# Patient Record
Sex: Female | Born: 1940 | Race: Black or African American | Hispanic: No | Marital: Married | Smoking: Never smoker
Health system: Southern US, Community
[De-identification: ages and names within clinical notes are randomized; demographics above are authoritative.]

## PROBLEM LIST (undated history)

## (undated) DIAGNOSIS — I10 Essential (primary) hypertension: Secondary | ICD-10-CM

## (undated) DIAGNOSIS — I639 Cerebral infarction, unspecified: Secondary | ICD-10-CM

## (undated) DIAGNOSIS — I509 Heart failure, unspecified: Secondary | ICD-10-CM

## (undated) DIAGNOSIS — T4145XA Adverse effect of unspecified anesthetic, initial encounter: Secondary | ICD-10-CM

## (undated) DIAGNOSIS — R600 Localized edema: Secondary | ICD-10-CM

## (undated) DIAGNOSIS — I739 Peripheral vascular disease, unspecified: Secondary | ICD-10-CM

## (undated) DIAGNOSIS — M62830 Muscle spasm of back: Secondary | ICD-10-CM

## (undated) DIAGNOSIS — K219 Gastro-esophageal reflux disease without esophagitis: Secondary | ICD-10-CM

## (undated) DIAGNOSIS — T8859XA Other complications of anesthesia, initial encounter: Secondary | ICD-10-CM

## (undated) DIAGNOSIS — E7800 Pure hypercholesterolemia, unspecified: Secondary | ICD-10-CM

## (undated) DIAGNOSIS — T148XXA Other injury of unspecified body region, initial encounter: Secondary | ICD-10-CM

## (undated) DIAGNOSIS — E1140 Type 2 diabetes mellitus with diabetic neuropathy, unspecified: Secondary | ICD-10-CM

## (undated) DIAGNOSIS — E119 Type 2 diabetes mellitus without complications: Secondary | ICD-10-CM

## (undated) DIAGNOSIS — I350 Nonrheumatic aortic (valve) stenosis: Secondary | ICD-10-CM

## (undated) DIAGNOSIS — IMO0001 Reserved for inherently not codable concepts without codable children: Secondary | ICD-10-CM

## (undated) HISTORY — DX: Heart failure, unspecified: I50.9

## (undated) HISTORY — PX: CYST EXCISION: SHX5701

## (undated) HISTORY — DX: Nonrheumatic aortic (valve) stenosis: I35.0

## (undated) HISTORY — PX: EYE SURGERY: SHX253

---

## 2014-02-03 ENCOUNTER — Emergency Department (HOSPITAL_COMMUNITY): Payer: Medicare HMO

## 2014-02-03 ENCOUNTER — Inpatient Hospital Stay (HOSPITAL_COMMUNITY)
Admission: EM | Admit: 2014-02-03 | Discharge: 2014-02-10 | DRG: 480 | Disposition: A | Discharge status: Skilled Nursing Facility | Payer: Medicare HMO | Attending: Internal Medicine | Admitting: Internal Medicine

## 2014-02-03 ENCOUNTER — Encounter (HOSPITAL_COMMUNITY): Payer: Self-pay | Admitting: Emergency Medicine

## 2014-02-03 DIAGNOSIS — I10 Essential (primary) hypertension: Secondary | ICD-10-CM

## 2014-02-03 DIAGNOSIS — K219 Gastro-esophageal reflux disease without esophagitis: Secondary | ICD-10-CM | POA: Diagnosis present

## 2014-02-03 DIAGNOSIS — S7223XA Displaced subtrochanteric fracture of unspecified femur, initial encounter for closed fracture: Principal | ICD-10-CM | POA: Diagnosis present

## 2014-02-03 DIAGNOSIS — S82409A Unspecified fracture of shaft of unspecified fibula, initial encounter for closed fracture: Secondary | ICD-10-CM

## 2014-02-03 DIAGNOSIS — Z7982 Long term (current) use of aspirin: Secondary | ICD-10-CM

## 2014-02-03 DIAGNOSIS — Q231 Congenital insufficiency of aortic valve: Secondary | ICD-10-CM | POA: Diagnosis present

## 2014-02-03 DIAGNOSIS — S82831A Other fracture of upper and lower end of right fibula, initial encounter for closed fracture: Secondary | ICD-10-CM

## 2014-02-03 DIAGNOSIS — F05 Delirium due to known physiological condition: Secondary | ICD-10-CM | POA: Diagnosis not present

## 2014-02-03 DIAGNOSIS — W19XXXA Unspecified fall, initial encounter: Secondary | ICD-10-CM | POA: Diagnosis present

## 2014-02-03 DIAGNOSIS — Z882 Allergy status to sulfonamides status: Secondary | ICD-10-CM

## 2014-02-03 DIAGNOSIS — Z880 Allergy status to penicillin: Secondary | ICD-10-CM

## 2014-02-03 DIAGNOSIS — E876 Hypokalemia: Secondary | ICD-10-CM

## 2014-02-03 DIAGNOSIS — E785 Hyperlipidemia, unspecified: Secondary | ICD-10-CM | POA: Diagnosis present

## 2014-02-03 DIAGNOSIS — M25376 Other instability, unspecified foot: Secondary | ICD-10-CM | POA: Diagnosis present

## 2014-02-03 DIAGNOSIS — S82899A Other fracture of unspecified lower leg, initial encounter for closed fracture: Secondary | ICD-10-CM | POA: Diagnosis present

## 2014-02-03 DIAGNOSIS — E119 Type 2 diabetes mellitus without complications: Secondary | ICD-10-CM

## 2014-02-03 DIAGNOSIS — K5900 Constipation, unspecified: Secondary | ICD-10-CM | POA: Diagnosis not present

## 2014-02-03 DIAGNOSIS — N390 Urinary tract infection, site not specified: Secondary | ICD-10-CM | POA: Diagnosis not present

## 2014-02-03 DIAGNOSIS — M25374 Other instability, right foot: Secondary | ICD-10-CM

## 2014-02-03 DIAGNOSIS — S93336A Other dislocation of unspecified foot, initial encounter: Secondary | ICD-10-CM | POA: Diagnosis present

## 2014-02-03 DIAGNOSIS — S92309A Fracture of unspecified metatarsal bone(s), unspecified foot, initial encounter for closed fracture: Secondary | ICD-10-CM | POA: Diagnosis present

## 2014-02-03 DIAGNOSIS — I509 Heart failure, unspecified: Secondary | ICD-10-CM | POA: Diagnosis present

## 2014-02-03 DIAGNOSIS — E1149 Type 2 diabetes mellitus with other diabetic neurological complication: Secondary | ICD-10-CM | POA: Diagnosis present

## 2014-02-03 DIAGNOSIS — E782 Mixed hyperlipidemia: Secondary | ICD-10-CM | POA: Diagnosis present

## 2014-02-03 DIAGNOSIS — I359 Nonrheumatic aortic valve disorder, unspecified: Secondary | ICD-10-CM | POA: Diagnosis present

## 2014-02-03 DIAGNOSIS — Z91013 Allergy to seafood: Secondary | ICD-10-CM

## 2014-02-03 DIAGNOSIS — I350 Nonrheumatic aortic (valve) stenosis: Secondary | ICD-10-CM

## 2014-02-03 DIAGNOSIS — E1142 Type 2 diabetes mellitus with diabetic polyneuropathy: Secondary | ICD-10-CM | POA: Diagnosis present

## 2014-02-03 DIAGNOSIS — Z7901 Long term (current) use of anticoagulants: Secondary | ICD-10-CM

## 2014-02-03 DIAGNOSIS — S72002A Fracture of unspecified part of neck of left femur, initial encounter for closed fracture: Secondary | ICD-10-CM

## 2014-02-03 DIAGNOSIS — S93104A Unspecified dislocation of right toe(s), initial encounter: Secondary | ICD-10-CM

## 2014-02-03 DIAGNOSIS — Z6833 Body mass index (BMI) 33.0-33.9, adult: Secondary | ICD-10-CM

## 2014-02-03 DIAGNOSIS — Z794 Long term (current) use of insulin: Secondary | ICD-10-CM

## 2014-02-03 DIAGNOSIS — Y92009 Unspecified place in unspecified non-institutional (private) residence as the place of occurrence of the external cause: Secondary | ICD-10-CM

## 2014-02-03 DIAGNOSIS — S93129A Dislocation of metatarsophalangeal joint of unspecified toe(s), initial encounter: Secondary | ICD-10-CM | POA: Diagnosis present

## 2014-02-03 DIAGNOSIS — E7800 Pure hypercholesterolemia, unspecified: Secondary | ICD-10-CM

## 2014-02-03 DIAGNOSIS — S7222XA Displaced subtrochanteric fracture of left femur, initial encounter for closed fracture: Secondary | ICD-10-CM

## 2014-02-03 DIAGNOSIS — I5033 Acute on chronic diastolic (congestive) heart failure: Secondary | ICD-10-CM | POA: Diagnosis not present

## 2014-02-03 DIAGNOSIS — Z79899 Other long term (current) drug therapy: Secondary | ICD-10-CM

## 2014-02-03 DIAGNOSIS — T502X5A Adverse effect of carbonic-anhydrase inhibitors, benzothiadiazides and other diuretics, initial encounter: Secondary | ICD-10-CM | POA: Diagnosis present

## 2014-02-03 DIAGNOSIS — Z833 Family history of diabetes mellitus: Secondary | ICD-10-CM

## 2014-02-03 DIAGNOSIS — S82401S Unspecified fracture of shaft of right fibula, sequela: Secondary | ICD-10-CM

## 2014-02-03 DIAGNOSIS — S72009A Fracture of unspecified part of neck of unspecified femur, initial encounter for closed fracture: Secondary | ICD-10-CM

## 2014-02-03 DIAGNOSIS — Z888 Allergy status to other drugs, medicaments and biological substances status: Secondary | ICD-10-CM

## 2014-02-03 HISTORY — DX: Pure hypercholesterolemia, unspecified: E78.00

## 2014-02-03 HISTORY — DX: Essential (primary) hypertension: I10

## 2014-02-03 HISTORY — DX: Type 2 diabetes mellitus without complications: E11.9

## 2014-02-03 HISTORY — DX: Muscle spasm of back: M62.830

## 2014-02-03 LAB — CBC WITH DIFFERENTIAL/PLATELET
Basophils Absolute: 0.1 10*3/uL (ref 0.0–0.1)
Basophils Relative: 0 % (ref 0–1)
EOS ABS: 0.5 10*3/uL (ref 0.0–0.7)
Eosinophils Relative: 3 % (ref 0–5)
HEMATOCRIT: 44 % (ref 36.0–46.0)
Hemoglobin: 14.2 g/dL (ref 12.0–15.0)
Lymphocytes Relative: 13 % (ref 12–46)
Lymphs Abs: 1.8 10*3/uL (ref 0.7–4.0)
MCH: 26.2 pg (ref 26.0–34.0)
MCHC: 32.3 g/dL (ref 30.0–36.0)
MCV: 81.3 fL (ref 78.0–100.0)
MONOS PCT: 5 % (ref 3–12)
Monocytes Absolute: 0.7 10*3/uL (ref 0.1–1.0)
NEUTROS PCT: 79 % — AB (ref 43–77)
Neutro Abs: 11.2 10*3/uL — ABNORMAL HIGH (ref 1.7–7.7)
Platelets: 195 10*3/uL (ref 150–400)
RBC: 5.41 MIL/uL — AB (ref 3.87–5.11)
RDW: 15.7 % — ABNORMAL HIGH (ref 11.5–15.5)
WBC: 14.2 10*3/uL — ABNORMAL HIGH (ref 4.0–10.5)

## 2014-02-03 LAB — GLUCOSE, CAPILLARY
GLUCOSE-CAPILLARY: 274 mg/dL — AB (ref 70–99)
Glucose-Capillary: 215 mg/dL — ABNORMAL HIGH (ref 70–99)

## 2014-02-03 LAB — MRSA PCR SCREENING: MRSA by PCR: NEGATIVE

## 2014-02-03 LAB — TROPONIN I: Troponin I: <0.3 ng/mL (ref <0.30)

## 2014-02-03 LAB — BASIC METABOLIC PANEL
Anion gap: 12 (ref 5–15)
BUN: 15 mg/dL (ref 6–23)
CHLORIDE: 99 meq/L (ref 96–112)
CO2: 33 meq/L — AB (ref 19–32)
Calcium: 9.7 mg/dL (ref 8.4–10.5)
Creatinine, Ser: 0.71 mg/dL (ref 0.50–1.10)
GFR calc non Af Amer: 85 mL/min — ABNORMAL LOW (ref >90)
GLUCOSE: 114 mg/dL — AB (ref 70–99)
POTASSIUM: 2.9 meq/L — AB (ref 3.7–5.3)
Sodium: 144 mEq/L (ref 137–147)

## 2014-02-03 LAB — ABO/RH: ABO/RH(D): O POS

## 2014-02-03 LAB — CBG MONITORING, ED: GLUCOSE-CAPILLARY: 165 mg/dL — AB (ref 70–99)

## 2014-02-03 LAB — PREPARE RBC (CROSSMATCH)

## 2014-02-03 LAB — HEMOGLOBIN A1C
Hgb A1c MFr Bld: 11.8 % — ABNORMAL HIGH (ref <5.7)
Mean Plasma Glucose: 292 mg/dL — ABNORMAL HIGH (ref <117)

## 2014-02-03 LAB — PROTIME-INR
INR: 1.03 (ref 0.00–1.49)
Prothrombin Time: 13.5 seconds (ref 11.6–15.2)

## 2014-02-03 MED ORDER — SODIUM CHLORIDE 0.9 % IV SOLN: 20.0000 mL | INTRAVENOUS | Status: DC

## 2014-02-03 MED ORDER — MORPHINE SULFATE 4 MG/ML IJ SOLN
4.0000 mg | Freq: Once | INTRAMUSCULAR | Status: AC
  Administered 2014-02-03: 4 mg via INTRAVENOUS
  Filled 2014-02-03: qty 1

## 2014-02-03 MED ORDER — LORATADINE 10 MG PO TABS
10.0000 mg | ORAL_TABLET | Freq: Every day | ORAL | Status: DC
  Administered 2014-02-03: 10 mg via ORAL
  Filled 2014-02-03 (×2): qty 1

## 2014-02-03 MED ORDER — HYDROCODONE-ACETAMINOPHEN 5-325 MG PO TABS
1.0000 | ORAL_TABLET | ORAL | Status: DC | PRN
  Administered 2014-02-03: 1 via ORAL
  Filled 2014-02-03 (×2): qty 1

## 2014-02-03 MED ORDER — ATORVASTATIN CALCIUM 20 MG PO TABS
20.0000 mg | ORAL_TABLET | Freq: Every day | ORAL | Status: DC
  Administered 2014-02-03 – 2014-02-09 (×5): 20 mg via ORAL
  Filled 2014-02-03 (×8): qty 1

## 2014-02-03 MED ORDER — INSULIN DETEMIR 100 UNIT/ML ~~LOC~~ SOLN
30.0000 [IU] | Freq: Every day | SUBCUTANEOUS | Status: DC
  Administered 2014-02-03: 30 [IU] via SUBCUTANEOUS
  Filled 2014-02-03 (×4): qty 0.3

## 2014-02-03 MED ORDER — ONDANSETRON HCL 4 MG PO TABS: 4.0000 mg | ORAL_TABLET | Freq: Four times a day (QID) | ORAL | Status: DC | PRN

## 2014-02-03 MED ORDER — ALUM & MAG HYDROXIDE-SIMETH 200-200-20 MG/5ML PO SUSP: 30.0000 mL | Freq: Four times a day (QID) | ORAL | Status: DC | PRN

## 2014-02-03 MED ORDER — ACETAMINOPHEN 650 MG RE SUPP: 650.0000 mg | Freq: Four times a day (QID) | RECTAL | Status: DC | PRN

## 2014-02-03 MED ORDER — ACETAMINOPHEN 325 MG PO TABS
650.0000 mg | ORAL_TABLET | Freq: Four times a day (QID) | ORAL | Status: DC | PRN
  Administered 2014-02-05 – 2014-02-09 (×8): 650 mg via ORAL
  Filled 2014-02-03 (×10): qty 2

## 2014-02-03 MED ORDER — PANTOPRAZOLE SODIUM 40 MG PO TBEC
40.0000 mg | DELAYED_RELEASE_TABLET | Freq: Every day | ORAL | Status: DC
  Administered 2014-02-03 – 2014-02-10 (×7): 40 mg via ORAL
  Filled 2014-02-03 (×7): qty 1

## 2014-02-03 MED ORDER — HYDROCORTISONE 2.5 % RE CREA
1.0000 "application " | TOPICAL_CREAM | Freq: Four times a day (QID) | RECTAL | Status: DC
  Administered 2014-02-03 – 2014-02-10 (×15): 1 via TOPICAL
  Filled 2014-02-03 (×2): qty 28.35

## 2014-02-03 MED ORDER — AMLODIPINE BESYLATE 5 MG PO TABS
5.0000 mg | ORAL_TABLET | Freq: Every day | ORAL | Status: DC
  Administered 2014-02-03 – 2014-02-09 (×4): 5 mg via ORAL
  Filled 2014-02-03 (×7): qty 1

## 2014-02-03 MED ORDER — PROMETHAZINE HCL 25 MG/ML IJ SOLN
12.5000 mg | Freq: Four times a day (QID) | INTRAMUSCULAR | Status: DC | PRN
  Administered 2014-02-03: 12.5 mg via INTRAVENOUS
  Filled 2014-02-03: qty 1

## 2014-02-03 MED ORDER — DOCUSATE SODIUM 100 MG PO CAPS
100.0000 mg | ORAL_CAPSULE | Freq: Two times a day (BID) | ORAL | Status: DC
  Administered 2014-02-03 – 2014-02-07 (×7): 100 mg via ORAL
  Filled 2014-02-03 (×8): qty 1

## 2014-02-03 MED ORDER — LIDOCAINE HCL (PF) 1 % IJ SOLN
5.0000 mL | Freq: Once | INTRAMUSCULAR | Status: AC
  Administered 2014-02-03: 5 mL
  Filled 2014-02-03: qty 5

## 2014-02-03 MED ORDER — AMITRIPTYLINE HCL 10 MG PO TABS
10.0000 mg | ORAL_TABLET | Freq: Every day | ORAL | Status: DC
  Administered 2014-02-03 – 2014-02-09 (×6): 10 mg via ORAL
  Filled 2014-02-03 (×8): qty 1

## 2014-02-03 MED ORDER — POVIDONE-IODINE 10 % EX SOLN
CUTANEOUS | Status: AC
  Administered 2014-02-03: 12:00:00
  Filled 2014-02-03: qty 118

## 2014-02-03 MED ORDER — ONDANSETRON HCL 4 MG/2ML IJ SOLN: 4.0000 mg | Freq: Four times a day (QID) | INTRAMUSCULAR | Status: DC | PRN

## 2014-02-03 MED ORDER — POTASSIUM CHLORIDE 10 MEQ/100ML IV SOLN
10.0000 meq | INTRAVENOUS | Status: AC
  Administered 2014-02-03 (×3): 10 meq via INTRAVENOUS
  Filled 2014-02-03 (×2): qty 100

## 2014-02-03 MED ORDER — FENTANYL CITRATE 0.05 MG/ML IJ SOLN
50.0000 ug | INTRAMUSCULAR | Status: DC | PRN
  Administered 2014-02-03: 50 ug via INTRAVENOUS
  Filled 2014-02-03: qty 2

## 2014-02-03 MED ORDER — ONDANSETRON HCL 4 MG/2ML IJ SOLN: 4.0000 mg | Freq: Once | INTRAMUSCULAR | Status: DC

## 2014-02-03 MED ORDER — HYDROMORPHONE HCL PF 1 MG/ML IJ SOLN
0.5000 mg | INTRAMUSCULAR | Status: DC | PRN
  Administered 2014-02-03 – 2014-02-04 (×5): 0.5 mg via INTRAVENOUS
  Filled 2014-02-03 (×7): qty 1

## 2014-02-03 MED ORDER — MORPHINE SULFATE 4 MG/ML IJ SOLN
INTRAMUSCULAR | Status: AC
  Filled 2014-02-03: qty 1

## 2014-02-03 MED ORDER — VANCOMYCIN HCL IN DEXTROSE 1-5 GM/200ML-% IV SOLN
1000.0000 mg | INTRAVENOUS | Status: DC
  Filled 2014-02-03: qty 200

## 2014-02-03 MED ORDER — ONDANSETRON HCL 4 MG/2ML IJ SOLN
4.0000 mg | Freq: Once | INTRAMUSCULAR | Status: AC
  Administered 2014-02-03: 4 mg via INTRAVENOUS
  Filled 2014-02-03: qty 2

## 2014-02-03 MED ORDER — MORPHINE SULFATE 2 MG/ML IJ SOLN: 1.0000 mg | INTRAMUSCULAR | Status: DC | PRN

## 2014-02-03 MED ORDER — INSULIN ASPART 100 UNIT/ML ~~LOC~~ SOLN
0.0000 [IU] | Freq: Every day | SUBCUTANEOUS | Status: DC
Start: 2014-02-03 — End: 2014-02-04
  Administered 2014-02-03: 3 [IU] via SUBCUTANEOUS

## 2014-02-03 MED ORDER — INSULIN ASPART 100 UNIT/ML ~~LOC~~ SOLN
0.0000 [IU] | Freq: Three times a day (TID) | SUBCUTANEOUS | Status: DC
  Administered 2014-02-03: 5 [IU] via SUBCUTANEOUS
  Administered 2014-02-04: 3 [IU] via SUBCUTANEOUS
  Administered 2014-02-04: 5 [IU] via SUBCUTANEOUS

## 2014-02-03 MED ORDER — POTASSIUM CHLORIDE 10 MEQ/100ML IV SOLN
10.0000 meq | Freq: Once | INTRAVENOUS | Status: AC
  Administered 2014-02-03: 10 meq via INTRAVENOUS
  Filled 2014-02-03: qty 100

## 2014-02-03 MED ORDER — HEPARIN SODIUM (PORCINE) 5000 UNIT/ML IJ SOLN
5000.0000 [IU] | Freq: Once | INTRAMUSCULAR | Status: AC
  Administered 2014-02-03: 5000 [IU] via SUBCUTANEOUS
  Filled 2014-02-03: qty 1

## 2014-02-03 MED ORDER — POTASSIUM CHLORIDE IN NACL 20-0.9 MEQ/L-% IV SOLN
INTRAVENOUS | Status: DC
  Administered 2014-02-03 – 2014-02-04 (×2): via INTRAVENOUS
  Filled 2014-02-03: qty 1000

## 2014-02-03 MED ORDER — CHLORHEXIDINE GLUCONATE 4 % EX LIQD
60.0000 mL | Freq: Once | CUTANEOUS | Status: AC
  Administered 2014-02-04: 4 via TOPICAL
  Filled 2014-02-03: qty 15

## 2014-02-03 MED ORDER — MORPHINE SULFATE 4 MG/ML IJ SOLN
4.0000 mg | Freq: Once | INTRAMUSCULAR | Status: AC
  Administered 2014-02-03: 4 mg via INTRAVENOUS

## 2014-02-03 MED ORDER — FLUTICASONE PROPIONATE 50 MCG/ACT NA SUSP
1.0000 | Freq: Every day | NASAL | Status: DC
  Administered 2014-02-03 – 2014-02-10 (×6): 1 via NASAL
  Filled 2014-02-03 (×2): qty 16

## 2014-02-03 MED ORDER — ZOLPIDEM TARTRATE 5 MG PO TABS: 5.0000 mg | ORAL_TABLET | Freq: Every evening | ORAL | Status: DC | PRN

## 2014-02-03 MED ORDER — GABAPENTIN 300 MG PO CAPS
300.0000 mg | ORAL_CAPSULE | Freq: Two times a day (BID) | ORAL | Status: DC
  Administered 2014-02-03 (×2): 300 mg via ORAL
  Filled 2014-02-03 (×5): qty 1

## 2014-02-03 NOTE — Consult Note (Signed)
Reason for Consult left hip fracture subtrochanteric portion Referring Physician: Dr. Donell Beers is an 73 y.o. female.  HPI: 73 year-old female mechanical fall at home turned fell injured her hip complaint left hip pain complains of inability to walk complains of right ankle pain also injured her left foot at the great toe with a dislocation. Dr. Luna Glasgow called me he's on call but he doesn't do these types of subtrochanteric fractures. He asked if I could perform the surgery. I reviewed the x-rays and agree to go ahead and schedule the patient for intramedullary nail fixation of the left hip. A closed reduction was then attempted and performed on the foot and the ankle fracture on the right allowed to be addressed at a later setting    Past Medical History  Diagnosis Date  . HTN (hypertension)   . Diabetes mellitus without complication   . High cholesterol   . Spasm of back muscles     History reviewed. No pertinent past surgical history.  Family History  Problem Relation Age of Onset  . Diabetes Other     Social History:  reports that she has never smoked. She has never used smokeless tobacco. She reports that she does not drink alcohol or use illicit drugs.  Allergies:  Allergies  Allergen Reactions  . Iodine Anaphylaxis  . Penicillins Anaphylaxis  . Shellfish Allergy Anaphylaxis  . Sulfa Antibiotics Anaphylaxis    Medications: I have reviewed the patient's current medications.  Results for orders placed during the hospital encounter of 02/03/14 (from the past 48 hour(s))  BASIC METABOLIC PANEL     Status: Abnormal   Collection Time    02/03/14  9:13 AM      Result Value Ref Range   Sodium 144  137 - 147 mEq/L   Potassium 2.9 (*) 3.7 - 5.3 mEq/L   Comment: CRITICAL RESULT CALLED TO, READ BACK BY AND VERIFIED WITH:     CRUISE,J AT 9:50AM ON 02/03/14 BY FESTERMAN,C   Chloride 99  96 - 112 mEq/L   CO2 33 (*) 19 - 32 mEq/L   Glucose, Bld 114 (*) 70 - 99 mg/dL   BUN 15  6 - 23 mg/dL   Creatinine, Ser 0.71  0.50 - 1.10 mg/dL   Calcium 9.7  8.4 - 10.5 mg/dL   GFR calc non Af Amer 85 (*) >90 mL/min   GFR calc Af Amer >90  >90 mL/min   Comment: (NOTE)     The eGFR has been calculated using the CKD EPI equation.     This calculation has not been validated in all clinical situations.     eGFR's persistently <90 mL/min signify possible Chronic Kidney     Disease.   Anion gap 12  5 - 15  CBC WITH DIFFERENTIAL     Status: Abnormal   Collection Time    02/03/14  9:13 AM      Result Value Ref Range   WBC 14.2 (*) 4.0 - 10.5 K/uL   RBC 5.41 (*) 3.87 - 5.11 MIL/uL   Hemoglobin 14.2  12.0 - 15.0 g/dL   HCT 44.0  36.0 - 46.0 %   MCV 81.3  78.0 - 100.0 fL   MCH 26.2  26.0 - 34.0 pg   MCHC 32.3  30.0 - 36.0 g/dL   RDW 15.7 (*) 11.5 - 15.5 %   Platelets 195  150 - 400 K/uL   Neutrophils Relative % 79 (*) 43 - 77 %  Neutro Abs 11.2 (*) 1.7 - 7.7 K/uL   Lymphocytes Relative 13  12 - 46 %   Lymphs Abs 1.8  0.7 - 4.0 K/uL   Monocytes Relative 5  3 - 12 %   Monocytes Absolute 0.7  0.1 - 1.0 K/uL   Eosinophils Relative 3  0 - 5 %   Eosinophils Absolute 0.5  0.0 - 0.7 K/uL   Basophils Relative 0  0 - 1 %   Basophils Absolute 0.1  0.0 - 0.1 K/uL  PROTIME-INR     Status: None   Collection Time    02/03/14  9:13 AM      Result Value Ref Range   Prothrombin Time 13.5  11.6 - 15.2 seconds   INR 1.03  0.00 - 1.49  TROPONIN I     Status: None   Collection Time    02/03/14  9:13 AM      Result Value Ref Range   Troponin I <0.30  <0.30 ng/mL   Comment:            Due to the release kinetics of cTnI,     a negative result within the first hours     of the onset of symptoms does not rule out     myocardial infarction with certainty.     If myocardial infarction is still suspected,     repeat the test at appropriate intervals.  TYPE AND SCREEN     Status: None   Collection Time    02/03/14  9:40 AM      Result Value Ref Range   ABO/RH(D) O POS      Antibody Screen NEG     Sample Expiration 02/06/2014    CBG MONITORING, ED     Status: Abnormal   Collection Time    02/03/14 12:40 PM      Result Value Ref Range   Glucose-Capillary 165 (*) 70 - 99 mg/dL  GLUCOSE, CAPILLARY     Status: Abnormal   Collection Time    02/03/14  4:36 PM      Result Value Ref Range   Glucose-Capillary 215 (*) 70 - 99 mg/dL   Comment 1 Notify RN      Dg Chest 1 View  02/03/2014   CLINICAL DATA:  Fall.  Hip injury  EXAM: CHEST - 1 VIEW  COMPARISON:  None.  FINDINGS: Cardiac enlargement without heart failure. Lungs are clear. No displaced rib fracture.  IMPRESSION: Cardiac enlargement.  No active cardiopulmonary disease.   Electronically Signed   By: Franchot Gallo M.D.   On: 02/03/2014 10:54   Dg Hip Complete Left  02/03/2014   CLINICAL DATA:  Fall  EXAM: LEFT HIP - COMPLETE 2+ VIEW  COMPARISON:  None.  FINDINGS: Sub trochanteric fracture on the left with angulation and medial displacement. Left hip joint appears normal. No other fracture.  IMPRESSION: Displaced subtrochanteric fracture left femur   Electronically Signed   By: Franchot Gallo M.D.   On: 02/03/2014 10:55   Dg Ankle Complete Right  02/03/2014   CLINICAL DATA:  Fall  EXAM: RIGHT ANKLE - COMPLETE 3+ VIEW  COMPARISON:  None.  FINDINGS: Oblique nondisplaced fracture of the distal fibula. No fracture of the tibia. Ankle mortise intact.  Arterial calcification.  Calcaneal spurring.  IMPRESSION: Nondisplaced fracture distal fibula.   Electronically Signed   By: Franchot Gallo M.D.   On: 02/03/2014 10:59   Ct Head Wo Contrast  02/03/2014   CLINICAL DATA:  Fall  EXAM: CT HEAD WITHOUT CONTRAST  TECHNIQUE: Contiguous axial images were obtained from the base of the skull through the vertex without intravenous contrast.  COMPARISON:  None.  FINDINGS: No skull fracture is noted. The mastoid air cells are unremarkable. There is mucosal thickening with partial opacification left ethmoid air cells.  Mild cerebral  atrophy. Mild periventricular white matter decreased attenuation probable due to chronic small vessel ischemic changes. No acute cortical infarction. No mass lesion is noted on this unenhanced scan.  IMPRESSION: No acute intracranial abnormality. Mild cerebral atrophy. Periventricular white matter decreased attenuation probable due to chronic small vessel ischemic changes.   Electronically Signed   By: Lahoma Crocker M.D.   On: 02/03/2014 11:13   Dg Knee Complete 4 Views Left  02/03/2014   CLINICAL DATA:  Fall  EXAM: LEFT KNEE - COMPLETE 4+ VIEW  COMPARISON:  None.  FINDINGS: There is no evidence of fracture, dislocation, or joint effusion. There is no evidence of arthropathy or other focal bone abnormality. Soft tissues are unremarkable.  IMPRESSION: Negative.   Electronically Signed   By: Franchot Gallo M.D.   On: 02/03/2014 10:58   Dg Foot 2 Views Right  02/03/2014   CLINICAL DATA:  Postreduction.  EXAM: RIGHT FOOT - 2 VIEW  COMPARISON:  Right foot radiograph February 03, 2014 at 1018 hr.  FINDINGS: Suspected residual subluxation without frank dislocation of first metatarsophalangeal fracture dislocation seen on prior radiograph. Dorsal foot soft tissue swelling without subcutaneous gas radiopaque foreign bodies. Moderate vascular calcifications.  IMPRESSION: Improved alignment of first metatarsophalangeal fracture dislocation.   Electronically Signed   By: Elon Alas   On: 02/03/2014 12:33   Dg Foot Complete Right  02/03/2014   CLINICAL DATA:  Fall  EXAM: RIGHT FOOT COMPLETE - 3+ VIEW  COMPARISON:  None.  FINDINGS: Dorsal dislocation of the first metatarsophalangeal joint. Possible small avulsion fracture.  No other fracture.  Diffuse arterial calcification.  IMPRESSION: Dislocation of the first MTP   Electronically Signed   By: Franchot Gallo M.D.   On: 02/03/2014 10:57    Review of Systems  Unable to perform ROS: mental acuity   Blood pressure 141/80, pulse 93, temperature 97 F (36.1 C),  temperature source Oral, resp. rate 18, height '5\' 5"'  (1.651 m), weight 200 lb (90.719 kg), SpO2 95.00%. Physical Exam  Constitutional: She appears well-developed and well-nourished. No distress.  HENT:  Head: Normocephalic.  Eyes: Pupils are equal, round, and reactive to light.  Neck: Normal range of motion.  Cardiovascular: Normal rate.   Respiratory: Effort normal.  GI: Soft.  Musculoskeletal:       Right shoulder: Normal.       Left shoulder: Normal.       Right hip: Normal.       Left hip: She exhibits decreased range of motion, decreased strength, tenderness, bony tenderness, swelling and deformity. She exhibits no laceration.       Right knee: Normal.       Right ankle: She exhibits swelling. She exhibits no deformity, no laceration and normal pulse. Tenderness. Lateral malleolus tenderness found. No medial malleolus and no AITFL tenderness found.       Right lower leg: Normal.       Feet:  Neurological: She displays normal reflexes. No cranial nerve deficit. She exhibits normal muscle tone. Coordination normal.  Confused   Skin: Skin is warm and dry. She is not diaphoretic.  Psychiatric: She has a normal mood and affect. Her  behavior is normal. Judgment and thought content normal.    Assessment/Plan: Left subtrochanteric hip fracture  Right great toe dislocation reduced by Dr Luna Glasgow  Right ankle fracture may need surgery   Risks discussed are high Non healing Shortening Need for bone graft Hardware removal Non union / malunion  Pneumonia Bleeding  Transfusion  And others   Left femoral nailing   Arther Abbott 02/03/2014, 5:38 PM

## 2014-02-03 NOTE — ED Notes (Signed)
Patient brought in via EMS from home.  Alert and oriented. Airway patent. Patient on LSB. Patient c/o left hip pain and headache after tripping and falling this morning in kitchen. Shortening and rotation noted. Patient reports hitting head but denies LOC, dizziness or blurred vision.

## 2014-02-03 NOTE — ED Notes (Signed)
Pt vomiting, Dr Christy Gentles informed, pt was co feeling hungry earlier. Verbal order given for zofran and ok for pt to have something to eat when she is feeling

## 2014-02-03 NOTE — H&P (Signed)
Patient seen and examined. Above note reviewed.  Patient was admitted after a mechanical fall resulting in multiple fractures, namely left hip fracture and right fibula fracture. She has been seen by orthopedics and plans are for operative repair.  Case discussed with Dr. Aline Brochure.  She has not had any chest pain or shortness of breath.  She does not have any known cardiac history that patient is aware of. EKG was reviewed and shows T wave inversions in inferolateral leads. No prior EKG for review.  She has a systolic ejection murmur on physical exam.  Will need to obtain echocardiogram to further risk stratify her for surgery.  Will try and obtain records from primary care doctor.  MEMON,JEHANZEB

## 2014-02-03 NOTE — ED Notes (Signed)
RADIOLOGY IN ROOM TO PERFORM PORT XRAY OF REDUCED TOE

## 2014-02-03 NOTE — H&P (Signed)
[facility] History and Physical  [name]:[id number] DOB: [birth year] DOA: 02/03/2014  Referring physician:  PCP: PROVIDER NOT IN SYSTEM   Chief Complaint: fall/left hip pain  HPI: [name] is a 73 y.o. female with a past medical history that includes hypertension, diabetes, high cholesterol presents to the emergency department after a mechanical fall with the chief complaint of left hip pain. Initial evaluation reveals displaced subtrochanteric fracture left femur as well as nondisplaced fracture distal fibula and dislocated MTP.  She reports that she experienced a mechanical fall as she was turning around in the kitchen. She reports that she did strike her head but denies losing consciousness. She was unable to get up so her husband called EMs. She denies any dizziness chest pain palpitations shortness of breath prior to the fall. He denies any recent fever chills nausea vomiting unintentional weight loss. She does report that any movement of her left lower extremity worsens the pain in her hip.   Workup in the emergency department includes a basic metabolic panel significant for potassium of 2.9. Complete blood count with WBC 14.2 and serum glucose 114. CT of her head showed no acute abnormality. This x-ray shows cardiac enlargement without CHF or cardiopulmonary disease. EKG with borderline right axis deviation.  In the emergency department she received fentanyl, lidocaine, morphine, Zofran and 10 mEq of potassium chloride intravenously.   On my exam she is hemodynamically stable alert oriented nontoxic appearing. She is afebrile and not hypoxic. She does complain of nausea and had some dry heaving.   Review of Systems:  10 point review of systems completed and all systems are negative except as indicated in the history of present illness Past Medical History  Diagnosis Date  . HTN (hypertension)   . Diabetes mellitus without complication   . High cholesterol   .  Spasm of back muscles    History reviewed. No pertinent past surgical history. Social History:  reports that she has never smoked. She has never used smokeless tobacco. She reports that she does not drink alcohol or use illicit drugs. She is married she lives at home with her husband she uses a walker or cane for ambulation at home. Allergies  Allergen Reactions  . Iodine Anaphylaxis  . Penicillins Anaphylaxis  . Shellfish Allergy Anaphylaxis  . Sulfa Antibiotics Anaphylaxis    Family History  Problem Relation Age of Onset  . Diabetes Other    family medical history reviewed and deemed noncontributory to the admission of this elderly lady  Prior to Admission medications   Medication Sig Start Date End Date Taking? Authorizing Provider  amitriptyline (ELAVIL) 10 MG tablet Take 10 mg by mouth at bedtime.   Yes Historical Provider, MD  amLODipine (NORVASC) 5 MG tablet Take 5 mg by mouth daily.   Yes Historical Provider, MD  aspirin EC 81 MG tablet Take 162 mg by mouth daily.   Yes Historical Provider, MD  atorvastatin (LIPITOR) 20 MG tablet Take 20 mg by mouth at bedtime.   Yes Historical Provider, MD  cetirizine (ZYRTEC) 10 MG tablet Take 10 mg by mouth daily as needed for allergies.   Yes Historical Provider, MD  docusate sodium (COLACE) 100 MG capsule Take 100 mg by mouth 2 (two) times daily.   Yes Historical Provider, MD  fluticasone (FLONASE) 50 MCG/ACT nasal spray Place 1 spray into both nostrils daily.   Yes Historical Provider, MD  gabapentin (NEURONTIN) 300 MG capsule Take 300 mg by mouth 2 (two)  times daily.   Yes Historical Provider, MD  hydrocortisone (ANUSOL-HC) 2.5 % rectal cream Apply 1 application topically 4 (four) times daily.   Yes Historical Provider, MD  insulin detemir (LEVEMIR) 100 UNIT/ML injection Inject 73 Units into the skin at bedtime.   Yes Historical Provider, MD  insulin glulisine (APIDRA) 100 UNIT/ML injection Inject 6 Units into the skin 3 (three) times  daily before meals. 15 minutes before breakfast, lunch, and dinner.   Yes Historical Provider, MD  losartan-hydrochlorothiazide (HYZAAR) 100-25 MG per tablet Take 1 tablet by mouth daily.   Yes Historical Provider, MD  meloxicam (MOBIC) 15 MG tablet Take 15 mg by mouth daily as needed for pain.   Yes Historical Provider, MD  omeprazole (PRILOSEC) 20 MG capsule Take 20 mg by mouth daily.   Yes Historical Provider, MD  Vitamin D, Ergocalciferol, (DRISDOL) 50000 UNITS CAPS capsule Take 50,000 Units by mouth every 7 (seven) days. For 12 weeks then once a month.   Yes Historical Provider, MD   Physical Exam: Filed Vitals:   02/03/14 1132  BP: 128/56  Pulse: 90  Temp:   Resp: 17    BP 128/56  Pulse 90  Temp(Src) 97.7 F (36.5 C) (Oral)  Resp 17  Ht 5\' 5"  (1.651 m)  Wt 90.719 kg (200 lb)  BMI 33.28 kg/m2  SpO2 99%  General:  Somewhat uncomfortable appearing obese no acute distress Eyes: PERRL, normal lids, irises & conjunctiva ENT: Ears clear nose without drainage oropharynx without erythema or exudate. Mucous membranes of her mouth are pink but dry Neck: no LAD, masses or thyromegaly Cardiovascular: RRR, no m/r/g. Trace lower extremity edema on the left. Right lower leg and foot with cast Respiratory: Slightly shallow and somewhat difficult to assess giving her mobility limitations. Breath sounds distant but clear. Abdomen: Obese soft positive bowel sounds throughout nontender to palpation no mass organomegaly noted Skin: no rash or induration seen on limited exam Musculoskeletal: Left lower terminate with rotation and a little bit shorter. Tenderness in the left hip and left knee area to palpation. Right leg with cast from knee down. Toes slightly cool but sensation circulation and Psychiatric: grossly normal mood and affect, speech fluent and appropriate Neurologic: grossly non-focal. Oriented x3 speech clear facial symmetry           Labs on Admission:  Basic Metabolic  Panel:  Recent Labs Lab 02/03/14 0913  NA 144  K 2.9*  CL 99  CO2 33*  GLUCOSE 114*  BUN 15  CREATININE 0.71  CALCIUM 9.7   Liver Function Tests: No results found for this basename: AST, ALT, ALKPHOS, BILITOT, PROT, ALBUMIN,  in the last 168 hours No results found for this basename: LIPASE, AMYLASE,  in the last 168 hours No results found for this basename: AMMONIA,  in the last 168 hours CBC:  Recent Labs Lab 02/03/14 0913  WBC 14.2*  NEUTROABS 11.2*  HGB 14.2  HCT 44.0  MCV 81.3  PLT 195   Cardiac Enzymes:  Recent Labs Lab 02/03/14 0913  TROPONINI <0.30    BNP (last 3 results) No results found for this basename: PROBNP,  in the last 8760 hours CBG:  Recent Labs Lab 02/03/14 1240  GLUCAP 165*    Radiological Exams on Admission: Dg Chest 1 View  02/03/2014   CLINICAL DATA:  Fall.  Hip injury  EXAM: CHEST - 1 VIEW  COMPARISON:  None.  FINDINGS: Cardiac enlargement without heart failure. Lungs are clear. No displaced rib fracture.  IMPRESSION: Cardiac enlargement.  No active cardiopulmonary disease.   Electronically Signed   By: Franchot Gallo M.D.   On: 02/03/2014 10:54   Dg Hip Complete Left  02/03/2014   CLINICAL DATA:  Fall  EXAM: LEFT HIP - COMPLETE 2+ VIEW  COMPARISON:  None.  FINDINGS: Sub trochanteric fracture on the left with angulation and medial displacement. Left hip joint appears normal. No other fracture.  IMPRESSION: Displaced subtrochanteric fracture left femur   Electronically Signed   By: Franchot Gallo M.D.   On: 02/03/2014 10:55   Dg Ankle Complete Right  02/03/2014   CLINICAL DATA:  Fall  EXAM: RIGHT ANKLE - COMPLETE 3+ VIEW  COMPARISON:  None.  FINDINGS: Oblique nondisplaced fracture of the distal fibula. No fracture of the tibia. Ankle mortise intact.  Arterial calcification.  Calcaneal spurring.  IMPRESSION: Nondisplaced fracture distal fibula.   Electronically Signed   By: Franchot Gallo M.D.   On: 02/03/2014 10:59   Ct Head Wo  Contrast  02/03/2014   CLINICAL DATA:  Fall  EXAM: CT HEAD WITHOUT CONTRAST  TECHNIQUE: Contiguous axial images were obtained from the base of the skull through the vertex without intravenous contrast.  COMPARISON:  None.  FINDINGS: No skull fracture is noted. The mastoid air cells are unremarkable. There is mucosal thickening with partial opacification left ethmoid air cells.  Mild cerebral atrophy. Mild periventricular white matter decreased attenuation probable due to chronic small vessel ischemic changes. No acute cortical infarction. No mass lesion is noted on this unenhanced scan.  IMPRESSION: No acute intracranial abnormality. Mild cerebral atrophy. Periventricular white matter decreased attenuation probable due to chronic small vessel ischemic changes.   Electronically Signed   By: Lahoma Crocker M.D.   On: 02/03/2014 11:13   Dg Knee Complete 4 Views Left  02/03/2014   CLINICAL DATA:  Fall  EXAM: LEFT KNEE - COMPLETE 4+ VIEW  COMPARISON:  None.  FINDINGS: There is no evidence of fracture, dislocation, or joint effusion. There is no evidence of arthropathy or other focal bone abnormality. Soft tissues are unremarkable.  IMPRESSION: Negative.   Electronically Signed   By: Franchot Gallo M.D.   On: 02/03/2014 10:58   Dg Foot 2 Views Right  02/03/2014   CLINICAL DATA:  Postreduction.  EXAM: RIGHT FOOT - 2 VIEW  COMPARISON:  Right foot radiograph February 03, 2014 at 1018 hr.  FINDINGS: Suspected residual subluxation without frank dislocation of first metatarsophalangeal fracture dislocation seen on prior radiograph. Dorsal foot soft tissue swelling without subcutaneous gas radiopaque foreign bodies. Moderate vascular calcifications.  IMPRESSION: Improved alignment of first metatarsophalangeal fracture dislocation.   Electronically Signed   By: Elon Alas   On: 02/03/2014 12:33   Dg Foot Complete Right  02/03/2014   CLINICAL DATA:  Fall  EXAM: RIGHT FOOT COMPLETE - 3+ VIEW  COMPARISON:  None.  FINDINGS:  Dorsal dislocation of the first metatarsophalangeal joint. Possible small avulsion fracture.  No other fracture.  Diffuse arterial calcification.  IMPRESSION: Dislocation of the first MTP   Electronically Signed   By: Franchot Gallo M.D.   On: 02/03/2014 10:57    EKG:[name]   Assessment/Plan Principal Problem:   Hip fracture, left: Related to mechanical fall. Dr. Aline Brochure for orthopedics has already been consulted. Plan is to operate in the morning. Will give 1 dose of heparin make n.p.o. after midnight. Dr. care in the form of pain management and anti emetic. Active Problems:    Fibula fracture; leg cast applied  while in the emergency department. Continue with pain management. Followed by orthopedic  Abnormal EKG. No previous EKG for comparison. Patient has no complaints of chest pain. No medical history of heart disease. Initial troponin is negative. Will repeat EKG in the morning and obtain a 2-D echo for completeness.    HTN (hypertension): Home medications include Norvasc, losartan, hydrochlorothiazide. Will continue the Norvasc and hold the losartan and hydrochlorothiazide for now. Will monitor blood pressure closely and resume losartan when indicated.   Hypokalemia: Likely related to medication specifically hydrochlorothiazide. Will hold that for now. Will replete intravenously. Will recheck in the a.m.    Diabetes mellitus without complication: Will provide Levemir at half her home dose. Will use sliding scale insulin for optimal control. Will obtain a hemoglobin A1c. Will provide clear liquids given her current nausea. She will be n.p.o. past midnight    High cholesterol: Continue      MTP instability: Planes of pain in her right toe. Pain management     Dr Aline Brochure orthopedics  Code Status: full Family Communication: sister at bediside Disposition Plan: likely need snf  Time spent: 81 minutes  [facility] Pager [phone]  **Disclaimer: This note may have  been dictated with voice recognition software. Similar sounding words can inadvertently be transcribed and this note may contain transcription errors which may not have been corrected upon publication of note.**

## 2014-02-03 NOTE — Progress Notes (Signed)
Patient ID: [name], female   DOB: 03-30-1941, 73 y.o.   MRN: [id number] The patient has a left hip fracture subtrochanteric variety.  Surgery we performed at 8:30 on 02/04/2014  We will do intramedullary nailing of the left hip  We will need to have 3 units of blood available. Heparin should be stopped at midnight. The patient be n.p.o. after midnight.  I will be in to see the patient this evening.

## 2014-02-03 NOTE — ED Provider Notes (Signed)
CSN: [id number]     Arrival date & time 02/03/14  3154 History  This chart was scribed for [name], MD by Ludger Nutting, ED Scribe. This patient was seen in room APA06/APA06 and the patient's care was started 9:10 AM.    Chief Complaint  Patient presents with  . Fall  . Hip Injury      Patient is a 73 y.o. female presenting with fall. The history is provided by the patient. No language interpreter was used.  Fall This is a new problem. The current episode started less than 1 hour ago. Nothing relieves the symptoms. She has tried nothing for the symptoms.    HPI Comments: [name] is a 73 y.o. female who presents to the Emergency Department complaining of a fall that occurred PTA. Patient states she was in the kitchen when she turned around and subsequently fell, landing on her right side. Patient states she struck her head on the ground but denies LOC. She now complains of left hip pain, right great toe pain, and dizziness. She states any movements of the LLE worsens her left hip pain. She denies neck pain, back pain.  She denies any active CP/SOB before or after fall   Past Medical History  Diagnosis Date  . HTN (hypertension)   . Diabetes mellitus without complication   . High cholesterol   . Spasm of back muscles    History reviewed. No pertinent past surgical history. Family History  Problem Relation Age of Onset  . Diabetes Other    History  Substance Use Topics  . Smoking status: Never Smoker   . Smokeless tobacco: Never Used  . Alcohol Use: No   OB History   Grav Para Term Preterm Abortions TAB SAB Ect Mult Living   6 2 2  4  4   2      Review of Systems  Musculoskeletal: Positive for arthralgias (left hip pain, right great toe pain). Negative for back pain and neck pain.  Neurological: Positive for dizziness. Negative for syncope.  All other systems reviewed and are negative.     Allergies  Iodine; Penicillins; Shellfish allergy; and Sulfa  antibiotics  Home Medications   Prior to Admission medications   Not on File   BP 210/79  Pulse 89  Temp(Src) 97.7 F (36.5 C) (Oral)  Resp 25  Ht 5\' 5"  (1.651 m)  Wt 200 lb (90.719 kg)  BMI 33.28 kg/m2  SpO2 93% Physical Exam  Nursing note and vitals reviewed.  CONSTITUTIONAL: Well developed/well nourished HEAD: Normocephalic/atraumatic EYES: EOMI/PERRL ENMT: Mucous membranes moist, No evidence of facial/nasal trauma NECK: supple no meningeal signs SPINE:entire spine nontender CV: S1/S2 noted, no murmurs/rubs/gallops noted LUNGS: Lungs are clear to auscultation bilaterally, no apparent distress ABDOMEN: soft, nontender, no rebound or guarding, she is obese GU:no cva tenderness NEURO: Pt is awake/alert, moves all extremitiesx4 EXTREMITIES: tenderness to right great toe and right foot. LLE external rotation noted, tenderness to left hip and left knee, distal cap refill <2- 3 seconds in both feet. Able to move all toes in left foot. All other extremities/joints palpated/ranged and nontender  SKIN: warm, color normal PSYCH: no abnormalities of mood noted   ED Course  Reduction of dislocation Performed by: [name] Authorized by: [name] Consent: Verbal consent obtained. Patient identity confirmed: verbally with patient and arm band Time out: Immediately prior to procedure a "time out" was called to verify the correct patient, procedure, equipment, support staff and  site/side marked as required. Local anesthesia used: yes Anesthesia: local infiltration Local anesthetic: lidocaine 1% without epinephrine Anesthetic total: 4 ml Patient sedated: no Patient tolerance: Patient tolerated the procedure well with no immediate complications. Comments: Reduction of right 1st MTP by traction Pt tolerated well and improved alignment noted after procedure    SPLINT APPLICATION Date/Time: 46/96/29 Authorized by: [name] Consent: Verbal consent  obtained. Risks and benefits: risks, benefits and alternatives were discussed Consent given by: patient Splint applied by: nurse Location details: right lower extremity Splint type: posterior leg and stirrup Supplies used: fiberglass Post-procedure: The splinted body part was neurovascularly unchanged following the procedure. Patient tolerance: Patient tolerated the procedure well with no immediate complications.    DIAGNOSTIC STUDIES: Oxygen Saturation is 93% on RA, adequate by my interpretation.    COORDINATION OF CARE: 9:23 AM Discussed treatment plan with pt at bedside and pt agreed to plan. Pt with abnormal EKG but no old to compare, will add on troponin (but pt denies CP) Suspect left hip fracture 11:05 AM Xray notes fracture D/w dr Luna Glasgow - will likely operate tomorrow  11:29 AM Patient and family updated on imaging and lab results. Patient and family understand the need for hospitalization and surgery to left hip.  Pt stabilized in the ED D/w dr Luna Glasgow, he reports dr Aline Brochure will perform surgery tomorrow D/w dr Roderic Palau, will admit for medical clearance in anticipation of surgery tomorrow   BP 141/80  Pulse 93  Temp(Src) 97 F (36.1 C) (Oral)  Resp 18  Ht 5\' 5"  (1.651 m)  Wt 200 lb (90.719 kg)  BMI 33.28 kg/m2  SpO2 95%  Labs Review Labs Reviewed  BASIC METABOLIC PANEL - Abnormal; Notable for the following:    Potassium 2.9 (*)    CO2 33 (*)    Glucose, Bld 114 (*)    GFR calc non Af Amer 85 (*)    All other components within normal limits  CBC WITH DIFFERENTIAL - Abnormal; Notable for the following:    WBC 14.2 (*)    RBC 5.41 (*)    RDW 15.7 (*)    Neutrophils Relative % 79 (*)    Neutro Abs 11.2 (*)    All other components within normal limits  PROTIME-INR  TROPONIN I  TYPE AND SCREEN    Imaging Review Dg Chest 1 View  02/03/2014   CLINICAL DATA:  Fall.  Hip injury  EXAM: CHEST - 1 VIEW  COMPARISON:  None.  FINDINGS: Cardiac enlargement without  heart failure. Lungs are clear. No displaced rib fracture.  IMPRESSION: Cardiac enlargement.  No active cardiopulmonary disease.   Electronically Signed   By: Franchot Gallo M.D.   On: 02/03/2014 10:54   Dg Hip Complete Left  02/03/2014   CLINICAL DATA:  Fall  EXAM: LEFT HIP - COMPLETE 2+ VIEW  COMPARISON:  None.  FINDINGS: Sub trochanteric fracture on the left with angulation and medial displacement. Left hip joint appears normal. No other fracture.  IMPRESSION: Displaced subtrochanteric fracture left femur   Electronically Signed   By: Franchot Gallo M.D.   On: 02/03/2014 10:55   Dg Ankle Complete Right  02/03/2014   CLINICAL DATA:  Fall  EXAM: RIGHT ANKLE - COMPLETE 3+ VIEW  COMPARISON:  None.  FINDINGS: Oblique nondisplaced fracture of the distal fibula. No fracture of the tibia. Ankle mortise intact.  Arterial calcification.  Calcaneal spurring.  IMPRESSION: Nondisplaced fracture distal fibula.   Electronically Signed   By: Franchot Gallo  M.D.   On: 02/03/2014 10:59   Dg Knee Complete 4 Views Left  02/03/2014   CLINICAL DATA:  Fall  EXAM: LEFT KNEE - COMPLETE 4+ VIEW  COMPARISON:  None.  FINDINGS: There is no evidence of fracture, dislocation, or joint effusion. There is no evidence of arthropathy or other focal bone abnormality. Soft tissues are unremarkable.  IMPRESSION: Negative.   Electronically Signed   By: Franchot Gallo M.D.   On: 02/03/2014 10:58   Dg Foot Complete Right  02/03/2014   CLINICAL DATA:  Fall  EXAM: RIGHT FOOT COMPLETE - 3+ VIEW  COMPARISON:  None.  FINDINGS: Dorsal dislocation of the first metatarsophalangeal joint. Possible small avulsion fracture.  No other fracture.  Diffuse arterial calcification.  IMPRESSION: Dislocation of the first MTP   Electronically Signed   By: Franchot Gallo M.D.   On: 02/03/2014 10:57     EKG Interpretation   Date/Time:  Thursday February 03 2014 09:22:14 EDT Ventricular Rate:  83 PR Interval:  152 QRS Duration: 85 QT Interval:  376 QTC  Calculation: 442 R Axis:   85 Text Interpretation:  Sinus rhythm Borderline right axis deviation Repol  abnrm suggests ischemia, anterolateral Baseline wander in lead(s) II III  aVF No previous ECGs available Confirmed by Christy Gentles  MD, Elenore Rota ([id number])  on 02/03/2014 9:31:19 AM      MDM   Final diagnoses:  Closed left subtrochanteric femur fracture, initial encounter  Closed fracture of right distal fibula  Dislocation of great toe, right, closed, initial encounter  Hypokalemia    Nursing notes including past medical history and social history reviewed and considered in documentation Labs/vital reviewed and considered xrays reviewed and considered   I personally performed the services described in this documentation, which was scribed in my presence. The recorded information has been reviewed and is accurate.      [name], MD 02/03/14 [phone]

## 2014-02-04 ENCOUNTER — Inpatient Hospital Stay (HOSPITAL_COMMUNITY): Payer: Medicare HMO | Admitting: Anesthesiology

## 2014-02-04 ENCOUNTER — Encounter (HOSPITAL_COMMUNITY): Payer: Self-pay | Admitting: Anesthesiology

## 2014-02-04 ENCOUNTER — Inpatient Hospital Stay (HOSPITAL_COMMUNITY): Payer: Medicare HMO

## 2014-02-04 ENCOUNTER — Encounter (HOSPITAL_COMMUNITY)
Admission: EM | Disposition: A | Discharge status: Skilled Nursing Facility | Payer: Self-pay | Source: Home / Self Care | Attending: Internal Medicine

## 2014-02-04 ENCOUNTER — Encounter (HOSPITAL_COMMUNITY): Payer: Medicare HMO | Admitting: Anesthesiology

## 2014-02-04 DIAGNOSIS — M7989 Other specified soft tissue disorders: Secondary | ICD-10-CM

## 2014-02-04 DIAGNOSIS — S82899A Other fracture of unspecified lower leg, initial encounter for closed fracture: Secondary | ICD-10-CM

## 2014-02-04 DIAGNOSIS — I350 Nonrheumatic aortic (valve) stenosis: Secondary | ICD-10-CM | POA: Diagnosis present

## 2014-02-04 DIAGNOSIS — I359 Nonrheumatic aortic valve disorder, unspecified: Secondary | ICD-10-CM

## 2014-02-04 HISTORY — PX: INTRAMEDULLARY (IM) NAIL INTERTROCHANTERIC: SHX5875

## 2014-02-04 LAB — CBC
HEMATOCRIT: 42.7 % (ref 36.0–46.0)
HEMOGLOBIN: 13.2 g/dL (ref 12.0–15.0)
MCH: 25.9 pg — ABNORMAL LOW (ref 26.0–34.0)
MCHC: 30.9 g/dL (ref 30.0–36.0)
MCV: 83.7 fL (ref 78.0–100.0)
Platelets: 189 10*3/uL (ref 150–400)
RBC: 5.1 MIL/uL (ref 3.87–5.11)
RDW: 16 % — AB (ref 11.5–15.5)
WBC: 11.9 10*3/uL — ABNORMAL HIGH (ref 4.0–10.5)

## 2014-02-04 LAB — BASIC METABOLIC PANEL
ANION GAP: 8 (ref 5–15)
BUN: 13 mg/dL (ref 6–23)
CHLORIDE: 100 meq/L (ref 96–112)
CO2: 34 meq/L — AB (ref 19–32)
Calcium: 8.8 mg/dL (ref 8.4–10.5)
Creatinine, Ser: 0.73 mg/dL (ref 0.50–1.10)
GFR calc Af Amer: >90 mL/min (ref >90)
GFR calc non Af Amer: 84 mL/min — ABNORMAL LOW (ref >90)
Glucose, Bld: 195 mg/dL — ABNORMAL HIGH (ref 70–99)
POTASSIUM: 4 meq/L (ref 3.7–5.3)
SODIUM: 142 meq/L (ref 137–147)

## 2014-02-04 LAB — TYPE AND SCREEN
ABO/RH(D): O POS
ANTIBODY SCREEN: NEGATIVE

## 2014-02-04 LAB — ABO/RH: ABO/RH(D): O POS

## 2014-02-04 LAB — PROTIME-INR
INR: 1.08 (ref 0.00–1.49)
PROTHROMBIN TIME: 14 s (ref 11.6–15.2)

## 2014-02-04 LAB — GLUCOSE, CAPILLARY
GLUCOSE-CAPILLARY: 177 mg/dL — AB (ref 70–99)
GLUCOSE-CAPILLARY: 109 mg/dL — AB (ref 70–99)
Glucose-Capillary: 212 mg/dL — ABNORMAL HIGH (ref 70–99)

## 2014-02-04 SURGERY — CANCELLED PROCEDURE
Laterality: Left

## 2014-02-04 SURGERY — FIXATION, FRACTURE, INTERTROCHANTERIC, WITH INTRAMEDULLARY ROD
Anesthesia: General | Site: Hip | Laterality: Left

## 2014-02-04 MED ORDER — FENTANYL CITRATE 0.05 MG/ML IJ SOLN
INTRAMUSCULAR | Status: DC | PRN
  Administered 2014-02-04: 50 ug via INTRAVENOUS
  Administered 2014-02-04: 100 ug via INTRAVENOUS

## 2014-02-04 MED ORDER — HEPARIN SODIUM (PORCINE) 5000 UNIT/ML IJ SOLN
5000.0000 [IU] | Freq: Three times a day (TID) | INTRAMUSCULAR | Status: DC
  Administered 2014-02-04: 5000 [IU] via SUBCUTANEOUS
  Filled 2014-02-04 (×2): qty 1

## 2014-02-04 MED ORDER — CEFAZOLIN SODIUM-DEXTROSE 2-3 GM-% IV SOLR
INTRAVENOUS | Status: AC
  Filled 2014-02-04: qty 50

## 2014-02-04 MED ORDER — ONDANSETRON HCL 4 MG/2ML IJ SOLN
INTRAMUSCULAR | Status: DC | PRN
Start: 2014-02-04 — End: 2014-02-05
  Administered 2014-02-04: 4 mg via INTRAVENOUS

## 2014-02-04 MED ORDER — INSULIN ASPART 100 UNIT/ML ~~LOC~~ SOLN: 0.0000 [IU] | SUBCUTANEOUS | Status: DC

## 2014-02-04 MED ORDER — SUCCINYLCHOLINE CHLORIDE 20 MG/ML IJ SOLN
INTRAMUSCULAR | Status: DC | PRN
  Administered 2014-02-04: 140 mg via INTRAVENOUS

## 2014-02-04 MED ORDER — HYDRALAZINE HCL 20 MG/ML IJ SOLN: 10.0000 mg | Freq: Four times a day (QID) | INTRAMUSCULAR | Status: DC | PRN

## 2014-02-04 MED ORDER — SUCCINYLCHOLINE CHLORIDE 20 MG/ML IJ SOLN
INTRAMUSCULAR | Status: AC
  Filled 2014-02-04: qty 1

## 2014-02-04 MED ORDER — INSULIN ASPART 100 UNIT/ML ~~LOC~~ SOLN
0.0000 [IU] | Freq: Three times a day (TID) | SUBCUTANEOUS | Status: DC
Start: 2014-02-05 — End: 2014-02-05
  Administered 2014-02-05 (×2): 5 [IU] via SUBCUTANEOUS
  Administered 2014-02-05: 8 [IU] via SUBCUTANEOUS

## 2014-02-04 MED ORDER — FUROSEMIDE 10 MG/ML IJ SOLN
20.0000 mg | Freq: Once | INTRAMUSCULAR | Status: AC
  Administered 2014-02-04: 20 mg via INTRAVENOUS
  Filled 2014-02-04: qty 2

## 2014-02-04 MED ORDER — SODIUM CHLORIDE 0.9 % IJ SOLN
INTRAMUSCULAR | Status: AC
  Filled 2014-02-04: qty 10

## 2014-02-04 MED ORDER — LACTATED RINGERS IV SOLN
INTRAVENOUS | Status: DC | PRN
  Administered 2014-02-04 (×2): via INTRAVENOUS

## 2014-02-04 MED ORDER — PROPOFOL 10 MG/ML IV BOLUS
INTRAVENOUS | Status: DC | PRN
  Administered 2014-02-04: 100 mg via INTRAVENOUS

## 2014-02-04 MED ORDER — LIDOCAINE HCL (CARDIAC) 20 MG/ML IV SOLN
INTRAVENOUS | Status: DC | PRN
  Administered 2014-02-04: 80 mg via INTRAVENOUS

## 2014-02-04 MED ORDER — EPHEDRINE SULFATE 50 MG/ML IJ SOLN
INTRAMUSCULAR | Status: AC
  Filled 2014-02-04: qty 1

## 2014-02-04 MED ORDER — PHENYLEPHRINE HCL 10 MG/ML IJ SOLN
10.0000 mg | INTRAVENOUS | Status: DC | PRN
  Administered 2014-02-04: 50 ug/min via INTRAVENOUS

## 2014-02-04 MED ORDER — PROPOFOL 10 MG/ML IV BOLUS
INTRAVENOUS | Status: AC
  Filled 2014-02-04: qty 20

## 2014-02-04 MED ORDER — INSULIN DETEMIR 100 UNIT/ML ~~LOC~~ SOLN
15.0000 [IU] | Freq: Every day | SUBCUTANEOUS | Status: DC
  Filled 2014-02-04: qty 0.15

## 2014-02-04 MED ORDER — LIDOCAINE HCL (CARDIAC) 20 MG/ML IV SOLN
INTRAVENOUS | Status: AC
  Filled 2014-02-04: qty 5

## 2014-02-04 MED ORDER — EPHEDRINE SULFATE 50 MG/ML IJ SOLN
INTRAMUSCULAR | Status: DC | PRN
  Administered 2014-02-04: 10 mg via INTRAVENOUS

## 2014-02-04 MED ORDER — BUPIVACAINE-EPINEPHRINE (PF) 0.5% -1:200000 IJ SOLN
INTRAMUSCULAR | Status: DC | PRN
  Administered 2014-02-04: 25 mL via PERINEURAL

## 2014-02-04 MED ORDER — CEFAZOLIN SODIUM-DEXTROSE 2-3 GM-% IV SOLR
INTRAVENOUS | Status: DC | PRN
  Administered 2014-02-04: 2 g via INTRAVENOUS

## 2014-02-04 MED ORDER — INSULIN DETEMIR 100 UNIT/ML ~~LOC~~ SOLN
30.0000 [IU] | Freq: Every day | SUBCUTANEOUS | Status: DC
  Administered 2014-02-05: 30 [IU] via SUBCUTANEOUS
  Filled 2014-02-04 (×2): qty 0.3

## 2014-02-04 MED ORDER — 0.9 % SODIUM CHLORIDE (POUR BTL) OPTIME
TOPICAL | Status: DC | PRN
  Administered 2014-02-04: 1000 mL

## 2014-02-04 MED ORDER — FENTANYL CITRATE 0.05 MG/ML IJ SOLN
INTRAMUSCULAR | Status: AC
  Filled 2014-02-04: qty 5

## 2014-02-04 MED ORDER — MIDAZOLAM HCL 2 MG/2ML IJ SOLN
INTRAMUSCULAR | Status: DC | PRN
  Administered 2014-02-04 (×2): 1 mg via INTRAVENOUS

## 2014-02-04 MED ORDER — MIDAZOLAM HCL 2 MG/2ML IJ SOLN
INTRAMUSCULAR | Status: AC
  Filled 2014-02-04: qty 2

## 2014-02-04 SURGICAL SUPPLY — 66 items
BENZOIN TINCTURE PRP APPL 2/3 (GAUZE/BANDAGES/DRESSINGS) ×3 IMPLANT
BIT DRILL 3.8X8 NS (BIT) ×3 IMPLANT
BIT DRILL 6.5X4.8 (BIT) ×3 IMPLANT
BOOTCOVER CLEANROOM LRG (PROTECTIVE WEAR) ×6 IMPLANT
CHLORAPREP W/TINT 26ML (MISCELLANEOUS) ×6 IMPLANT
CLOSURE WOUND 1/2 X4 (GAUZE/BANDAGES/DRESSINGS) ×1
COVER MAYO STAND STRL (DRAPES) ×9 IMPLANT
COVER PERINEAL POST (MISCELLANEOUS) ×3 IMPLANT
COVER SURGICAL LIGHT HANDLE (MISCELLANEOUS) ×3 IMPLANT
DERMABOND ADVANCED (GAUZE/BANDAGES/DRESSINGS) ×2
DERMABOND ADVANCED .7 DNX12 (GAUZE/BANDAGES/DRESSINGS) ×1 IMPLANT
DRAPE INCISE 23X17 IOBAN STRL (DRAPES) ×2
DRAPE INCISE IOBAN 23X17 STRL (DRAPES) ×1 IMPLANT
DRAPE ORTHO SPLIT 77X108 STRL (DRAPES) ×4
DRAPE PROXIMA HALF (DRAPES) ×3 IMPLANT
DRAPE STERI IOBAN 125X83 (DRAPES) IMPLANT
DRAPE SURG ORHT 6 SPLT 77X108 (DRAPES) ×2 IMPLANT
DRILL BIT 5.3 281013153 (MISCELLANEOUS) ×3 IMPLANT
DRSG AQUACEL AG ADV 3.5X10 (GAUZE/BANDAGES/DRESSINGS) ×6 IMPLANT
DRSG MEPILEX BORDER 4X4 (GAUZE/BANDAGES/DRESSINGS) ×3 IMPLANT
DRSG MEPILEX BORDER 4X8 (GAUZE/BANDAGES/DRESSINGS) ×6 IMPLANT
DURAPREP 26ML APPLICATOR (WOUND CARE) IMPLANT
ELECT CAUTERY BLADE 6.4 (BLADE) ×3 IMPLANT
ELECT REM PT RETURN 9FT ADLT (ELECTROSURGICAL) ×3
ELECTRODE REM PT RTRN 9FT ADLT (ELECTROSURGICAL) ×1 IMPLANT
EVACUATOR 1/8 PVC DRAIN (DRAIN) IMPLANT
FACESHIELD WRAPAROUND (MASK) IMPLANT
GAUZE XEROFORM 5X9 LF (GAUZE/BANDAGES/DRESSINGS) ×3 IMPLANT
GLOVE BIOGEL PI IND STRL 7.5 (GLOVE) ×1 IMPLANT
GLOVE BIOGEL PI IND STRL 8 (GLOVE) ×2 IMPLANT
GLOVE BIOGEL PI INDICATOR 7.5 (GLOVE) ×2
GLOVE BIOGEL PI INDICATOR 8 (GLOVE) ×4
GLOVE BIOGEL PI ORTHO PRO 7.5 (GLOVE) ×2
GLOVE ECLIPSE 8.0 STRL XLNG CF (GLOVE) ×3 IMPLANT
GLOVE PI ORTHO PRO STRL 7.5 (GLOVE) ×1 IMPLANT
GLOVE SURG SS PI 7.5 STRL IVOR (GLOVE) ×3 IMPLANT
GOWN STRL REIN 3XL LVL4 (GOWN DISPOSABLE) ×3 IMPLANT
GUIDEWIRE BALL NOSE 100CM (WIRE) ×3 IMPLANT
KIT ROOM TURNOVER OR (KITS) ×3 IMPLANT
LINER BOOT UNIVERSAL DISP (MISCELLANEOUS) ×3 IMPLANT
MANIFOLD NEPTUNE II (INSTRUMENTS) IMPLANT
NAIL TROCH 9X34 (Nail) ×3 IMPLANT
NS IRRIG 1000ML POUR BTL (IV SOLUTION) ×3 IMPLANT
PACK GENERAL/GYN (CUSTOM PROCEDURE TRAY) ×3 IMPLANT
PAD ARMBOARD 7.5X6 YLW CONV (MISCELLANEOUS) ×6 IMPLANT
PIN GUIDE 3.2 903003004 (MISCELLANEOUS) ×3 IMPLANT
PIN GUIDE 3.2X14 1401214 (MISCELLANEOUS) ×3 IMPLANT
SCREW ACE CORTICAL (Screw) ×5 IMPLANT
SCREW ACECAP 38MM (Screw) ×3 IMPLANT
SCREW BN FT 60X6.5XST DRV (Screw) ×1 IMPLANT
SCREW CANCELLOUS 6.5X90 (Screw) ×3 IMPLANT
STAPLER VISISTAT 35W (STAPLE) ×3 IMPLANT
STRIP CLOSURE SKIN 1/2X4 (GAUZE/BANDAGES/DRESSINGS) ×2 IMPLANT
SUT MNCRL AB 4-0 PS2 18 (SUTURE) ×6 IMPLANT
SUT VIC AB 0 CTB1 27 (SUTURE) ×3 IMPLANT
SUT VIC AB 1 CT1 27 (SUTURE) ×4
SUT VIC AB 1 CT1 27XBRD ANBCTR (SUTURE) ×2 IMPLANT
SUT VIC AB 2-0 CT1 27 (SUTURE) ×2
SUT VIC AB 2-0 CT1 TAPERPNT 27 (SUTURE) ×1 IMPLANT
SUT VIC AB 2-0 FS1 27 (SUTURE) ×3 IMPLANT
SUT VIC AB 2-0 SH 27 (SUTURE)
SUT VIC AB 2-0 SH 27XBRD (SUTURE) IMPLANT
SUT VIC AB 3-0 SH 8-18 (SUTURE) IMPLANT
TOWEL OR 17X24 6PK STRL BLUE (TOWEL DISPOSABLE) ×3 IMPLANT
TOWEL OR 17X26 10 PK STRL BLUE (TOWEL DISPOSABLE) ×3 IMPLANT
WATER STERILE IRR 1000ML POUR (IV SOLUTION) IMPLANT

## 2014-02-04 SURGICAL SUPPLY — 32 items
BAG HAMPER (MISCELLANEOUS) IMPLANT
BLADE 10 SAFETY STRL DISP (BLADE) IMPLANT
CHLORAPREP W/TINT 26ML (MISCELLANEOUS) IMPLANT
CLOTH BEACON ORANGE TIMEOUT ST (SAFETY) IMPLANT
COVER LIGHT HANDLE STERIS (MISCELLANEOUS) IMPLANT
DRAPE STERI IOBAN 125X83 (DRAPES) IMPLANT
DRSG MEPILEX BORDER 4X8 (GAUZE/BANDAGES/DRESSINGS) IMPLANT
GAUZE SPONGE 4X4 12PLY STRL (GAUZE/BANDAGES/DRESSINGS) IMPLANT
GAUZE SPONGE 4X4 16PLY XRAY LF (GAUZE/BANDAGES/DRESSINGS) IMPLANT
GAUZE XEROFORM 5X9 LF (GAUZE/BANDAGES/DRESSINGS) IMPLANT
GLOVE SKINSENSE NS SZ8.0 LF (GLOVE)
GLOVE SKINSENSE STRL SZ8.0 LF (GLOVE) IMPLANT
GLOVE SS N UNI LF 8.5 STRL (GLOVE) IMPLANT
GOWN STRL REUS W/TWL LRG LVL3 (GOWN DISPOSABLE) IMPLANT
GOWN STRL REUS W/TWL XL LVL3 (GOWN DISPOSABLE) IMPLANT
INST SET MAJOR BONE (KITS) IMPLANT
KIT ROOM TURNOVER APOR (KITS) IMPLANT
MANIFOLD NEPTUNE II (INSTRUMENTS) IMPLANT
MARKER SKIN DUAL TIP RULER LAB (MISCELLANEOUS) IMPLANT
NS IRRIG 1000ML POUR BTL (IV SOLUTION) IMPLANT
PACK BASIC III (CUSTOM PROCEDURE TRAY)
PACK SRG BSC III STRL LF ECLPS (CUSTOM PROCEDURE TRAY) IMPLANT
PAD ARMBOARD 7.5X6 YLW CONV (MISCELLANEOUS) IMPLANT
SET BASIN LINEN APH (SET/KITS/TRAYS/PACK) IMPLANT
SPONGE LAP 18X18 X RAY DECT (DISPOSABLE) IMPLANT
STAPLER VISISTAT 35W (STAPLE) IMPLANT
SUT MON AB 2-0 SH 27 (SUTURE)
SUT MON AB 2-0 SH27 (SUTURE) IMPLANT
SUT VIC AB 1 CT1 27 (SUTURE)
SUT VIC AB 1 CT1 27XBRD ANTBC (SUTURE) IMPLANT
SYR BULB IRRIGATION 50ML (SYRINGE) IMPLANT
TAPE MEDIFIX FOAM 3 (GAUZE/BANDAGES/DRESSINGS) IMPLANT

## 2014-02-04 NOTE — Progress Notes (Signed)
1400 - Patient stated numbness in fingers improving in hands and that right hand is "feeling much better".  Dr. Roderic Palau on unit and notified.

## 2014-02-04 NOTE — Clinical Social Work Placement (Signed)
Clinical Social Work Department CLINICAL SOCIAL WORK PLACEMENT NOTE 02/04/2014  Patient:  [name], [name]  Account Number:  [id number] Admit date:  02/03/2014  Clinical Social Worker:  Benay Pike, LCSW  Date/time:  02/04/2014 11:25 AM  Clinical Social Work is seeking post-discharge placement for this patient at the following level of care:   [place]   (*CSW will update this form in Epic as items are completed)   02/04/2014  Patient/family provided with [place] Department of Clinical Social Work's list of facilities offering this level of care within the geographic area requested by the patient (or if unable, by the patient's family).  02/04/2014  Patient/family informed of their freedom to choose among providers that offer the needed level of care, that participate in Medicare, Medicaid or managed care program needed by the patient, have an available bed and are willing to accept the patient.  02/04/2014  Patient/family informed of [facility]' ownership interest in [facility], as well as of the fact that they are under no obligation to receive care at this facility.  PASARR submitted to EDS on 02/03/2014 PASARR number received on 02/03/2014  FL2 transmitted to all facilities in geographic area requested by pt/family on  02/04/2014 FL2 transmitted to all facilities within larger geographic area on   Patient informed that his/her managed care company has contracts with or will negotiate with  certain facilities, including the following:     Patient/family informed of bed offers received:   Patient chooses bed at  Physician recommends and patient chooses bed at    Patient to be transferred to  on   Patient to be transferred to facility by  Patient and family notified of transfer on  Name of family member notified:    The following physician request were entered in Epic:   Additional Comments:  Benay Pike, [place]

## 2014-02-04 NOTE — Progress Notes (Signed)
  Echocardiogram 2D Echocardiogram has been performed.  [place], [place] 02/04/2014, 9:18 AM

## 2014-02-04 NOTE — Progress Notes (Signed)
[facility] PROGRESS NOTE  [name] XFG:[id number] DOB: [birth year] DOA: 02/03/2014 PCP: PROVIDER NOT IN SYSTEM  Assessment/Plan: 1. Left subtrochanteric hip fracture. Related to mechanical fall. Seen by Orthopedics Dr. Aline Brochure. Plans were to operate on patient this morning, but due to patient's cardiac history, anesthesia is concerned that patient may become unstable during surgery after administration of anesthetic and would benefit from transfer to [facility] where she can receive appropriate cardiac support.  Dr. Aline Brochure has discussed the case with Dr. Alvan Dame on call for orthopedics at [facility]. Patient will be kept n.p.o. in case surgery can be performed today 2.  Right fibula fracture. Reduced by Dr. Luna Glasgow. Patient is currently in a leg cast. 3. Abnormal EKG. Patient has T-wave inversions in the inferolateral leads. She does not have any chest pain initial cardiac markers are negative. This is likely a chronic finding. 4. Moderate aortic stenosis. Does not have any evidence of volume overload. 5. Diabetes, insulin-dependent. She received half her home dose of Levemir since she is n.p.o. Continue with sliding scale insulin 6. High cholesterol. Continue statin 7. Preoperative evaluation. Due to the patient's significant risk factors including diabetes, aortic stenosis, per Lyndel Safe perioperative cardiac risk, her risk for perioperative cardiac event is 1.3%. It would appear reasonable to proceed with surgery.  Code Status: full code Family Communication: discussed with husband at the bedside Disposition Plan: Plans are to transfer patient to [facility] for further care.  Discussed with Dr. Candiss Norse who has accepted the patient in transfer.   Consultants:  Orthopedics, Dr. Aline Brochure  Procedures: Echo: - Moderate to severe LVH with LVEF 96-78%, grade 1 diastolic dysfunction. Severe left atrial enlargement. 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 Unable to assess PASP.    Antibiotics:  Vancomycin, 02/04/14, preop 1 dose  HPI/Subjective: Patient reports that pain is reasonably controlled. No chest pain  Objective: Filed Vitals:   02/04/14 0626  BP: 150/84  Pulse: 93  Temp: 98.3 F (36.8 C)  Resp: 18    Intake/Output Summary (Last 24 hours) at 02/04/14 1117 Last data filed at 02/04/14 9381  Gross per 24 hour  Intake 1242.5 ml  Output    800 ml  Net  442.5 ml   Filed Weights   02/03/14 0850  Weight: 90.719 kg (200 lb)    Exam:   General:  NAD  Cardiovascular: S1, S2 RRR, +SEM   Respiratory: crackles at bases  Abdomen: soft, nt, nd, bs+  Musculoskeletal: right leg in cast, left leg is shortened, externally rotated, 1+ edema   Data Reviewed: Basic Metabolic Panel:  Recent Labs Lab 02/03/14 0913 02/04/14 0553  NA 144 142  K 2.9* 4.0  CL 99 100  CO2 33* 34*  GLUCOSE 114* 195*  BUN 15 13  CREATININE 0.71 0.73  CALCIUM 9.7 8.8   Liver Function Tests: No results found for this basename: AST, ALT, ALKPHOS, BILITOT, PROT, ALBUMIN,  in the last 168 hours No results found for this basename: LIPASE, AMYLASE,  in the last 168 hours No results found for this basename: AMMONIA,  in the last 168 hours CBC:  Recent Labs Lab 02/03/14 0913 02/04/14 0553  WBC 14.2* 11.9*  NEUTROABS 11.2*  --   HGB 14.2 13.2  HCT 44.0 42.7  MCV 81.3 83.7  PLT 195 189   Cardiac Enzymes:  Recent Labs Lab 02/03/14 0913  TROPONINI <0.30   BNP (last 3 results) No results found for this basename: PROBNP,  in the last 8760 hours CBG:  Recent Labs Lab 02/03/14 1240 02/03/14 1636 02/03/14 2135 02/04/14 0716 02/04/14 0927  GLUCAP 165* 215* 274* 177* 212*    Recent Results (from the past 240 hour(s))  MRSA PCR SCREENING     Status: None   Collection Time    02/03/14  7:03 PM      Result Value Ref Range Status   MRSA by PCR NEGATIVE  NEGATIVE Final   Comment:            The GeneXpert MRSA  Assay (FDA     approved for NASAL specimens     only), is one component of a     comprehensive MRSA colonization     surveillance program. It is not     intended to diagnose MRSA     infection nor to guide or     monitor treatment for     MRSA infections.     Studies: Dg Chest 1 View  02/03/2014   CLINICAL DATA:  Fall.  Hip injury  EXAM: CHEST - 1 VIEW  COMPARISON:  None.  FINDINGS: Cardiac enlargement without heart failure. Lungs are clear. No displaced rib fracture.  IMPRESSION: Cardiac enlargement.  No active cardiopulmonary disease.   Electronically Signed   By: Franchot Gallo M.D.   On: 02/03/2014 10:54   Dg Hip Complete Left  02/03/2014   CLINICAL DATA:  Fall  EXAM: LEFT HIP - COMPLETE 2+ VIEW  COMPARISON:  None.  FINDINGS: Sub trochanteric fracture on the left with angulation and medial displacement. Left hip joint appears normal. No other fracture.  IMPRESSION: Displaced subtrochanteric fracture left femur   Electronically Signed   By: Franchot Gallo M.D.   On: 02/03/2014 10:55   Dg Ankle Complete Right  02/03/2014   CLINICAL DATA:  Fall  EXAM: RIGHT ANKLE - COMPLETE 3+ VIEW  COMPARISON:  None.  FINDINGS: Oblique nondisplaced fracture of the distal fibula. No fracture of the tibia. Ankle mortise intact.  Arterial calcification.  Calcaneal spurring.  IMPRESSION: Nondisplaced fracture distal fibula.   Electronically Signed   By: Franchot Gallo M.D.   On: 02/03/2014 10:59   Ct Head Wo Contrast  02/03/2014   CLINICAL DATA:  Fall  EXAM: CT HEAD WITHOUT CONTRAST  TECHNIQUE: Contiguous axial images were obtained from the base of the skull through the vertex without intravenous contrast.  COMPARISON:  None.  FINDINGS: No skull fracture is noted. The mastoid air cells are unremarkable. There is mucosal thickening with partial opacification left ethmoid air cells.  Mild cerebral atrophy. Mild periventricular white matter decreased attenuation probable due to chronic small vessel ischemic changes. No  acute cortical infarction. No mass lesion is noted on this unenhanced scan.  IMPRESSION: No acute intracranial abnormality. Mild cerebral atrophy. Periventricular white matter decreased attenuation probable due to chronic small vessel ischemic changes.   Electronically Signed   By: Lahoma Crocker M.D.   On: 02/03/2014 11:13   Dg Knee Complete 4 Views Left  02/03/2014   CLINICAL DATA:  Fall  EXAM: LEFT KNEE - COMPLETE 4+ VIEW  COMPARISON:  None.  FINDINGS: There is no evidence of fracture, dislocation, or joint effusion. There is no evidence of arthropathy or other focal bone abnormality. Soft tissues are unremarkable.  IMPRESSION: Negative.   Electronically Signed   By: Franchot Gallo M.D.   On: 02/03/2014 10:58   Dg Foot 2 Views Right  02/03/2014   CLINICAL DATA:  Postreduction.  EXAM: RIGHT  FOOT - 2 VIEW  COMPARISON:  Right foot radiograph February 03, 2014 at 1018 hr.  FINDINGS: Suspected residual subluxation without frank dislocation of first metatarsophalangeal fracture dislocation seen on prior radiograph. Dorsal foot soft tissue swelling without subcutaneous gas radiopaque foreign bodies. Moderate vascular calcifications.  IMPRESSION: Improved alignment of first metatarsophalangeal fracture dislocation.   Electronically Signed   By: Elon Alas   On: 02/03/2014 12:33   Dg Foot Complete Right  02/03/2014   CLINICAL DATA:  Fall  EXAM: RIGHT FOOT COMPLETE - 3+ VIEW  COMPARISON:  None.  FINDINGS: Dorsal dislocation of the first metatarsophalangeal joint. Possible small avulsion fracture.  No other fracture.  Diffuse arterial calcification.  IMPRESSION: Dislocation of the first MTP   Electronically Signed   By: Franchot Gallo M.D.   On: 02/03/2014 10:57    Scheduled Meds: . amitriptyline  10 mg Oral QHS  . amLODipine  5 mg Oral Daily  . atorvastatin  20 mg Oral QHS  . docusate sodium  100 mg Oral BID  . fluticasone  1 spray Each Nare Daily  . furosemide  20 mg Intravenous Once  . gabapentin  300 mg  Oral BID  . hydrocortisone  1 application Topical QID  . insulin aspart  0-15 Units Subcutaneous TID WC  . insulin aspart  0-5 Units Subcutaneous QHS  . insulin detemir  30 Units Subcutaneous QHS  . loratadine  10 mg Oral Daily  . ondansetron (ZOFRAN) IV  4 mg Intravenous Once  . pantoprazole  40 mg Oral Daily  . vancomycin  1,000 mg Intravenous On Call to OR   Continuous Infusions: . 0.9 % NaCl with KCl 20 mEq / L 50 mL/hr at 02/03/14 1516    Principal Problem:   Hip fracture, left Active Problems:   Closed left subtrochanteric femur fracture   HTN (hypertension)   Diabetes mellitus without complication   High cholesterol   Hypokalemia   Fibula fracture   MTP instability   Hip fracture    Time spent: 61mins    Josey Forcier  [facility] Pager [phone]. If 7PM-7AM, please contact night-coverage at [email or web address], password [facility] 02/04/2014, 11:17 AM  LOS: 1 day

## 2014-02-04 NOTE — Brief Op Note (Signed)
02/03/2014 - 02/04/2014  11:42 PM  PATIENT:  [name]  73 y.o. female  PRE-OPERATIVE DIAGNOSIS:  Left Subtrochanteric femur Fracture  POST-OPERATIVE DIAGNOSIS:  Left Subtrochanteric femur Fracture  PROCEDURE:  Procedure(s): INTRAMEDULLARY (IM) NAIL INTERTROCHANTRIC FEMORAL (Left), ORIF left femur  SURGEON:  Surgeon(s) and Role:    * Mauri Pole, MD - Primary  PHYSICIAN ASSISTANT: Danae Orleans, PA-C  ANESTHESIA:   general  EBL:  Total I/O In: 1000 [I.V.:1000] Out: 350 [Urine:350]  BLOOD ADMINISTERED:none  DRAINS: none   LOCAL MEDICATIONS USED:  NONE  SPECIMEN:  No Specimen  DISPOSITION OF SPECIMEN:  N/A  COUNTS:  YES  TOURNIQUET:  * No tourniquets in log *  DICTATION: .Other Dictation: Dictation Number [id number]  PLAN OF CARE: Admit to inpatient   PATIENT DISPOSITION:  PACU - hemodynamically stable.   Delay start of Pharmacological VTE agent (>24hrs) due to surgical blood loss or risk of bleeding: no

## 2014-02-04 NOTE — Progress Notes (Signed)
Report called to Santiago Glad, Therapist, sports at [facility].

## 2014-02-04 NOTE — Progress Notes (Addendum)
Patient ID: [name], female   DOB: [birth year], 73 y.o.   MRN: [id number]  The patient HAS A Subtrochanteric fracture left hip. Also has a left great toe MTP dislocation, closed reduction by Dr Luna Glasgow needs repeat eval   Right lateral malleolus, fracture ; I planned on a second procedure to perform OTIF   Scheduled surgery for today on LEFT  hip. IM NAIL WITH SMITH NEPHEW ANTEGRADE LOCKED NAIL   Last heparin at midnight.  C/o constipation   Had ECHO this am   Medical work up revealed moderate aortic stenosis. Anesthetic plan was for spinal anesthesia. After speaking with the nurse anesthetist it was felt that she could potentially become unstable and she would be better served at [facility] hospital with appropriate cardiac support.  BMET    Component Value Date/Time   NA 142 02/04/2014 0553   K 4.0 02/04/2014 0553   CL 100 02/04/2014 0553   CO2 34* 02/04/2014 0553   GLUCOSE 195* 02/04/2014 0553   BUN 13 02/04/2014 0553   CREATININE 0.73 02/04/2014 0553   CALCIUM 8.8 02/04/2014 0553   GFRNONAA 84* 02/04/2014 0553   GFRAA >90 02/04/2014 0553    CBC    Component Value Date/Time   WBC 11.9* 02/04/2014 0553   RBC 5.10 02/04/2014 0553   HGB 13.2 02/04/2014 0553   HCT 42.7 02/04/2014 0553   PLT 189 02/04/2014 0553   MCV 83.7 02/04/2014 0553   MCH 25.9* 02/04/2014 0553   MCHC 30.9 02/04/2014 0553   RDW 16.0* 02/04/2014 0553   LYMPHSABS 1.8 02/03/2014 0913   MONOABS 0.7 02/03/2014 0913   EOSABS 0.5 02/03/2014 0913   BASOSABS 0.1 02/03/2014 0913   I  have spoken with the family and explained our concerns. They are agreeable to transfer.

## 2014-02-04 NOTE — Anesthesia Procedure Notes (Addendum)
Anesthesia Regional Block:  Femoral nerve block  Pre-Anesthetic Checklist: ,, timeout performed, Correct Patient, Correct Site, Correct Laterality, Correct Procedure, Correct Position, site marked, Risks and benefits discussed,  Surgical consent,  Pre-op evaluation,  At surgeon's request and post-op pain management  Laterality: Left and Lower  Prep: chloraprep       Needles:  Injection technique: Single-shot  Needle Type: Echogenic Needle     Needle Length: 9cm 9 cm Needle Gauge: 21 and 21 G    Additional Needles:  Procedures: ultrasound guided (picture in chart) Femoral nerve block Narrative:  Start time: 02/04/2014 9:02 PM End time: 02/04/2014 9:09 PM Injection made incrementally with aspirations every 5 mL.  Performed by: Personally  Anesthesiologist: Lorrene Reid, MD   Procedure Name: Intubation Date/Time: 02/04/2014 9:28 PM Performed by: Valetta Fuller Pre-anesthesia Checklist: Patient identified, Emergency Drugs available, Suction available and Patient being monitored Patient Re-evaluated:Patient Re-evaluated prior to inductionOxygen Delivery Method: Circle system utilized Preoxygenation: Pre-oxygenation with 100% oxygen Intubation Type: IV induction Ventilation: Mask ventilation without difficulty Laryngoscope Size: Miller and 2 Grade View: Grade I Tube type: Oral Tube size: 7.5 mm Number of attempts: 1 Airway Equipment and Method: Stylet Placement Confirmation: ETT inserted through vocal cords under direct vision,  positive ETCO2 and breath sounds checked- equal and bilateral Secured at: 23 cm Tube secured with: Tape Dental Injury: Teeth and Oropharynx as per pre-operative assessment

## 2014-02-04 NOTE — Anesthesia Preprocedure Evaluation (Addendum)
Anesthesia Evaluation  Patient identified by MRN, date of birth, ID band Patient awake    Reviewed: Allergy & Precautions, H&P , NPO status , Patient's Chart, lab work & pertinent test results  Airway Mallampati: II TM Distance: >3 FB Neck ROM: Full    Dental  (+) Teeth Intact, Dental Advisory Given   Pulmonary  breath sounds clear to auscultation        Cardiovascular hypertension, Pt. on medications + Valvular Problems/Murmurs (Moderate AS) AS Rhythm:Regular Rate:Normal     Neuro/Psych    GI/Hepatic   Endo/Other  diabetes, Well Controlled, Type 2, Insulin DependentMorbid obesity  Renal/GU      Musculoskeletal   Abdominal   Peds  Hematology   Anesthesia Other Findings Grinding teeth audibly during interview.  She states she does this when she is anxious. No loose teeth.  Reproductive/Obstetrics                          Anesthesia Physical Anesthesia Plan  ASA: III  Anesthesia Plan: General   Post-op Pain Management:    Induction: Intravenous  Airway Management Planned: Oral ETT  Additional Equipment:   Intra-op Plan:   Post-operative Plan: Extubation in OR  Informed Consent: I have reviewed the patients History and Physical, chart, labs and discussed the procedure including the risks, benefits and alternatives for the proposed anesthesia with the patient or authorized representative who has indicated his/her understanding and acceptance.   Dental advisory given  Plan Discussed with: Anesthesiologist, CRNA and Surgeon  Anesthesia Plan Comments:         Anesthesia Quick Evaluation

## 2014-02-04 NOTE — Anesthesia Preprocedure Evaluation (Addendum)
Anesthesia Evaluation  Patient identified by MRN, date of birth, ID band Patient awake    Reviewed: Allergy & Precautions, H&P , NPO status , Patient's Chart, lab work & pertinent test results, reviewed documented beta blocker date and time   History of Anesthesia Complications (+) PROLONGED EMERGENCE and DIFFICULT IV STICK / SPECIAL LINE  Airway Mallampati: II TM Distance: <3 FB Neck ROM: Full    Dental  (+) Dental Advisory Given, Chipped, Missing,    Pulmonary COPD   + decreased breath sounds      Cardiovascular Exercise Tolerance: Poor hypertension, Pt. on medications Rhythm:Regular Rate:Normal + Systolic murmurs    Neuro/Psych Anxiety    GI/Hepatic negative GI ROS,   Endo/Other  diabetes, Poorly Controlled, Type 2, Insulin Dependent  Renal/GU      Musculoskeletal   Abdominal (+) + obese,  Abdomen: soft. Bowel sounds: normal.  Peds  Hematology   Anesthesia Other Findings   Reproductive/Obstetrics                        Anesthesia Physical Anesthesia Plan  ASA: III  Anesthesia Plan: Spinal   Post-op Pain Management:    Induction:   Airway Management Planned: Simple Face Mask  Additional Equipment:   Intra-op Plan:   Post-operative Plan:   Informed Consent:   Dental advisory given  Plan Discussed with: Anesthesiologist and Surgeon  Anesthesia Plan Comments: (Planned procedure cancelled.)       Anesthesia Quick Evaluation

## 2014-02-04 NOTE — Clinical Social Work Psychosocial (Signed)
Clinical Social Work Department BRIEF PSYCHOSOCIAL ASSESSMENT 02/04/2014  Patient:  [name], [name]     Account Number:  [id number]     Admit date:  02/03/2014  Clinical Social Worker:  [name]  Date/Time:  02/04/2014 11:27 AM  Referred by:  CSW  Date Referred:  02/04/2014 Referred for  SNF Placement   Other Referral:   Interview type:  Patient Other interview type:   husband, son, brother    PSYCHOSOCIAL DATA Living Status:  HUSBAND Admitted from facility:   Level of care:   Primary support name:  [name] Primary support relationship to patient:  SPOUSE Degree of support available:   supportive    CURRENT CONCERNS Current Concerns  Post-Acute Placement   Other Concerns:    SOCIAL WORK ASSESSMENT / PLAN CSW met with pt and several of pt's family members with her permission- husband, son, and brother. Pt alert and oriented and reports she fell at home, fracturing her hip and fibula. Pt lives with her husband and appears to have extensive family support. She has two children, a son and daughter. At baseline, pt was independent and ambulated with a cane. She was still driving and able to manage household tasks on her own. CSW discussed that pt would likely need SNF after surgery. Explained placement process and [employer] authorization. Pt lives in [place], but would want placement in [place]. SNF list provided. MD in room during part of assessment. Pt is very nervous about surgery and recovery. CSW provided support in addition to other staff. Pt and husband agreeable to initiate bed search at [facility] and [facility].    Update: Pt will transfer to [facility] for surgery. CSW will plan to fax out referral, but CSW at [facility] will follow up.   Assessment/plan status:  Psychosocial Support/Ongoing Assessment of Needs Other assessment/ plan:   Information/referral to community resources:   SNF list    PATIENT'S/FAMILY'S RESPONSE TO PLAN OF CARE: Pt anxious about possible  complications in surgery. Support provided. [facility] CSW to follow up regarding disposition.       [name], [place]

## 2014-02-04 NOTE — Progress Notes (Addendum)
Late entry 1300 Nurse called to patient's room.  Patient states that both of her hands are numb.  States that it is only her hands and not her arms.  Patient does state that this occurs some times at home.  States that when this occurs at home, her blood sugar is usually either high or low.  Neuro checks WNL.  Grips equal, no drift, states fingertips feel numb.  CBG 141.  Dr. Roderic Palau on unit and notified.  No new orders at this time.  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  Will notify receiving RN and [facility].

## 2014-02-04 NOTE — Consult Note (Signed)
Reason for Consult:   Left hip fracture Referring Physician:   Dr. Billey Chang is an 73 y.o. female.  HPI: 73 year old female from [place] suffered a fall in her kitchen. Patient states she was in the kitchen when she turned around her right foot caught something and subsequently she fell, landing on her left side. Patient states she struck her head on the stove and then fell on left side. She now complains of left hip pain, right great toe pain, and dizziness. She states any movements of the LLE worsens her left hip pain. She denies neck pain, back pain. She denies any active CP/SOB before or after fall. Risks, benefits and expectations were discussed with the patient.  Risks including but not limited to the risk of anesthesia, blood clots, nerve damage, blood vessel damage, failure of the prosthesis, infection and up to and including death.  Patient and family understand the risks, benefits and expectations and wishes to proceed with surgery.    Past Medical History  Diagnosis Date  . HTN (hypertension)   . Diabetes mellitus without complication   . High cholesterol   . Spasm of back muscles     History reviewed. No pertinent past surgical history.  Family History  Problem Relation Age of Onset  . Diabetes Other     Social History:  reports that she has never smoked. She has never used smokeless tobacco. She reports that she does not drink alcohol or use illicit drugs.  Allergies:  Allergies  Allergen Reactions  . Iodine Anaphylaxis  . Penicillins Anaphylaxis  . Shellfish Allergy Anaphylaxis  . Sulfa Antibiotics Anaphylaxis    Results for orders placed during the hospital encounter of 02/03/14 (from the past 48 hour(s))  BASIC METABOLIC PANEL     Status: Abnormal   Collection Time    02/03/14  9:13 AM      Result Value Ref Range   Sodium 144  137 - 147 mEq/L   Potassium 2.9 (*) 3.7 - 5.3 mEq/L   Comment: CRITICAL RESULT CALLED TO, READ BACK BY AND VERIFIED  WITH:     CRUISE,J AT 9:50AM ON 02/03/14 BY FESTERMAN,C   Chloride 99  96 - 112 mEq/L   CO2 33 (*) 19 - 32 mEq/L   Glucose, Bld 114 (*) 70 - 99 mg/dL   BUN 15  6 - 23 mg/dL   Creatinine, Ser 0.71  0.50 - 1.10 mg/dL   Calcium 9.7  8.4 - 10.5 mg/dL   GFR calc non Af Amer 85 (*) >90 mL/min   GFR calc Af Amer >90  >90 mL/min   Comment: (NOTE)     The eGFR has been calculated using the CKD EPI equation.     This calculation has not been validated in all clinical situations.     eGFR's persistently <90 mL/min signify possible Chronic Kidney     Disease.   Anion gap 12  5 - 15  CBC WITH DIFFERENTIAL     Status: Abnormal   Collection Time    02/03/14  9:13 AM      Result Value Ref Range   WBC 14.2 (*) 4.0 - 10.5 K/uL   RBC 5.41 (*) 3.87 - 5.11 MIL/uL   Hemoglobin 14.2  12.0 - 15.0 g/dL   HCT 44.0  36.0 - 46.0 %   MCV 81.3  78.0 - 100.0 fL   MCH 26.2  26.0 - 34.0 pg   MCHC 32.3  30.0 -  36.0 g/dL   RDW 15.7 (*) 11.5 - 15.5 %   Platelets 195  150 - 400 K/uL   Neutrophils Relative % 79 (*) 43 - 77 %   Neutro Abs 11.2 (*) 1.7 - 7.7 K/uL   Lymphocytes Relative 13  12 - 46 %   Lymphs Abs 1.8  0.7 - 4.0 K/uL   Monocytes Relative 5  3 - 12 %   Monocytes Absolute 0.7  0.1 - 1.0 K/uL   Eosinophils Relative 3  0 - 5 %   Eosinophils Absolute 0.5  0.0 - 0.7 K/uL   Basophils Relative 0  0 - 1 %   Basophils Absolute 0.1  0.0 - 0.1 K/uL  PROTIME-INR     Status: None   Collection Time    02/03/14  9:13 AM      Result Value Ref Range   Prothrombin Time 13.5  11.6 - 15.2 seconds   INR 1.03  0.00 - 1.49  TROPONIN I     Status: None   Collection Time    02/03/14  9:13 AM      Result Value Ref Range   Troponin I <0.30  <0.30 ng/mL   Comment:            Due to the release kinetics of cTnI,     a negative result within the first hours     of the onset of symptoms does not rule out     myocardial infarction with certainty.     If myocardial infarction is still suspected,     repeat the test at  appropriate intervals.  HEMOGLOBIN A1C     Status: Abnormal   Collection Time    02/03/14  9:13 AM      Result Value Ref Range   Hemoglobin A1C 11.8 (*) <5.7 %   Comment: (NOTE)                                                                               According to the ADA Clinical Practice Recommendations for 2011, when     HbA1c is used as a screening test:      >=6.5%   Diagnostic of Diabetes Mellitus               (if abnormal result is confirmed)     5.7-6.4%   Increased risk of developing Diabetes Mellitus     References:Diagnosis and Classification of Diabetes Mellitus,Diabetes     VPXT,0626,94(WNIOE 1):S62-S69 and Standards of Medical Care in             Diabetes - 2011,Diabetes Care,2011,34 (Suppl 1):S11-S61.   Mean Plasma Glucose 292 (*) <117 mg/dL   Comment: Performed at [facility]     Status: None   Collection Time    02/03/14  9:40 AM      Result Value Ref Range   ABO/RH(D) O POS     Antibody Screen NEG     Sample Expiration 02/06/2014     Unit Number [id number]     Blood Component Type RED CELLS,LR     Unit division 00     Status of Unit ALLOCATED  Transfusion Status OK TO TRANSFUSE     Crossmatch Result Compatible     Unit Number [id number]     Blood Component Type RBC LR PHER1     Unit division 00     Status of Unit ALLOCATED     Transfusion Status OK TO TRANSFUSE     Crossmatch Result Compatible    PREPARE RBC (CROSSMATCH)     Status: None   Collection Time    02/03/14  9:40 AM      Result Value Ref Range   Order Confirmation ORDER PROCESSED BY BLOOD BANK    ABO/RH     Status: None   Collection Time    02/03/14  9:40 AM      Result Value Ref Range   ABO/RH(D) O POS    CBG MONITORING, ED     Status: Abnormal   Collection Time    02/03/14 12:40 PM      Result Value Ref Range   Glucose-Capillary 165 (*) 70 - 99 mg/dL  GLUCOSE, CAPILLARY     Status: Abnormal   Collection Time    02/03/14  4:36 PM      Result  Value Ref Range   Glucose-Capillary 215 (*) 70 - 99 mg/dL   Comment 1 Notify RN    MRSA PCR SCREENING     Status: None   Collection Time    02/03/14  7:03 PM      Result Value Ref Range   MRSA by PCR NEGATIVE  NEGATIVE   Comment:            The GeneXpert MRSA Assay (FDA     approved for NASAL specimens     only), is one component of a     comprehensive MRSA colonization     surveillance program. It is not     intended to diagnose MRSA     infection nor to guide or     monitor treatment for     MRSA infections.  GLUCOSE, CAPILLARY     Status: Abnormal   Collection Time    02/03/14  9:35 PM      Result Value Ref Range   Glucose-Capillary 274 (*) 70 - 99 mg/dL  BASIC METABOLIC PANEL     Status: Abnormal   Collection Time    02/04/14  5:53 AM      Result Value Ref Range   Sodium 142  137 - 147 mEq/L   Potassium 4.0  3.7 - 5.3 mEq/L   Comment: DELTA CHECK NOTED   Chloride 100  96 - 112 mEq/L   CO2 34 (*) 19 - 32 mEq/L   Glucose, Bld 195 (*) 70 - 99 mg/dL   BUN 13  6 - 23 mg/dL   Creatinine, Ser 0.73  0.50 - 1.10 mg/dL   Calcium 8.8  8.4 - 10.5 mg/dL   GFR calc non Af Amer 84 (*) >90 mL/min   GFR calc Af Amer >90  >90 mL/min   Comment: (NOTE)     The eGFR has been calculated using the CKD EPI equation.     This calculation has not been validated in all clinical situations.     eGFR's persistently <90 mL/min signify possible Chronic Kidney     Disease.   Anion gap 8  5 - 15  CBC     Status: Abnormal   Collection Time    02/04/14  5:53 AM      Result Value Ref Range  WBC 11.9 (*) 4.0 - 10.5 K/uL   RBC 5.10  3.87 - 5.11 MIL/uL   Hemoglobin 13.2  12.0 - 15.0 g/dL   HCT 42.7  36.0 - 46.0 %   MCV 83.7  78.0 - 100.0 fL   MCH 25.9 (*) 26.0 - 34.0 pg   MCHC 30.9  30.0 - 36.0 g/dL   RDW 16.0 (*) 11.5 - 15.5 %   Platelets 189  150 - 400 K/uL  GLUCOSE, CAPILLARY     Status: Abnormal   Collection Time    02/04/14  7:16 AM      Result Value Ref Range   Glucose-Capillary 177  (*) 70 - 99 mg/dL   Comment 1 Notify RN    GLUCOSE, CAPILLARY     Status: Abnormal   Collection Time    02/04/14  9:27 AM      Result Value Ref Range   Glucose-Capillary 212 (*) 70 - 99 mg/dL   Comment 1 Notify RN    GLUCOSE, CAPILLARY     Status: Abnormal   Collection Time    02/04/14  5:20 PM      Result Value Ref Range   Glucose-Capillary 109 (*) 70 - 99 mg/dL  TYPE AND SCREEN     Status: None   Collection Time    02/04/14  5:25 PM      Result Value Ref Range   ABO/RH(D) O POS     Antibody Screen NEG     Sample Expiration 02/07/2014    ABO/RH     Status: None   Collection Time    02/04/14  5:25 PM      Result Value Ref Range   ABO/RH(D) O POS      Dg Chest 1 View  02/03/2014   CLINICAL DATA:  Fall.  Hip injury  EXAM: CHEST - 1 VIEW  COMPARISON:  None.  FINDINGS: Cardiac enlargement without heart failure. Lungs are clear. No displaced rib fracture.  IMPRESSION: Cardiac enlargement.  No active cardiopulmonary disease.   Electronically Signed   By: Franchot Gallo M.D.   On: 02/03/2014 10:54   Dg Hip Complete Left  02/03/2014   CLINICAL DATA:  Fall  EXAM: LEFT HIP - COMPLETE 2+ VIEW  COMPARISON:  None.  FINDINGS: Sub trochanteric fracture on the left with angulation and medial displacement. Left hip joint appears normal. No other fracture.  IMPRESSION: Displaced subtrochanteric fracture left femur   Electronically Signed   By: Franchot Gallo M.D.   On: 02/03/2014 10:55   Dg Ankle Complete Right  02/03/2014   CLINICAL DATA:  Fall  EXAM: RIGHT ANKLE - COMPLETE 3+ VIEW  COMPARISON:  None.  FINDINGS: Oblique nondisplaced fracture of the distal fibula. No fracture of the tibia. Ankle mortise intact.  Arterial calcification.  Calcaneal spurring.  IMPRESSION: Nondisplaced fracture distal fibula.   Electronically Signed   By: Franchot Gallo M.D.   On: 02/03/2014 10:59   Ct Head Wo Contrast  02/03/2014   CLINICAL DATA:  Fall  EXAM: CT HEAD WITHOUT CONTRAST  TECHNIQUE: Contiguous axial images  were obtained from the base of the skull through the vertex without intravenous contrast.  COMPARISON:  None.  FINDINGS: No skull fracture is noted. The mastoid air cells are unremarkable. There is mucosal thickening with partial opacification left ethmoid air cells.  Mild cerebral atrophy. Mild periventricular white matter decreased attenuation probable due to chronic small vessel ischemic changes. No acute cortical infarction. No mass lesion is noted on  this unenhanced scan.  IMPRESSION: No acute intracranial abnormality. Mild cerebral atrophy. Periventricular white matter decreased attenuation probable due to chronic small vessel ischemic changes.   Electronically Signed   By: Lahoma Crocker M.D.   On: 02/03/2014 11:13   Dg Knee Complete 4 Views Left  02/03/2014   CLINICAL DATA:  Fall  EXAM: LEFT KNEE - COMPLETE 4+ VIEW  COMPARISON:  None.  FINDINGS: There is no evidence of fracture, dislocation, or joint effusion. There is no evidence of arthropathy or other focal bone abnormality. Soft tissues are unremarkable.  IMPRESSION: Negative.   Electronically Signed   By: Franchot Gallo M.D.   On: 02/03/2014 10:58   Dg Foot 2 Views Right  02/03/2014   CLINICAL DATA:  Postreduction.  EXAM: RIGHT FOOT - 2 VIEW  COMPARISON:  Right foot radiograph February 03, 2014 at 1018 hr.  FINDINGS: Suspected residual subluxation without frank dislocation of first metatarsophalangeal fracture dislocation seen on prior radiograph. Dorsal foot soft tissue swelling without subcutaneous gas radiopaque foreign bodies. Moderate vascular calcifications.  IMPRESSION: Improved alignment of first metatarsophalangeal fracture dislocation.   Electronically Signed   By: Elon Alas   On: 02/03/2014 12:33   Dg Foot Complete Right  02/03/2014   CLINICAL DATA:  Fall  EXAM: RIGHT FOOT COMPLETE - 3+ VIEW  COMPARISON:  None.  FINDINGS: Dorsal dislocation of the first metatarsophalangeal joint. Possible small avulsion fracture.  No other fracture.   Diffuse arterial calcification.  IMPRESSION: Dislocation of the first MTP   Electronically Signed   By: Franchot Gallo M.D.   On: 02/03/2014 10:57    Review of Systems  Constitutional: Negative.   HENT: Negative.   Eyes: Negative.   Respiratory: Negative.   Cardiovascular: Positive for leg swelling.  Gastrointestinal: Negative.   Genitourinary: Negative.   Musculoskeletal: Positive for joint pain and myalgias.  Skin: Negative.   Neurological: Negative.   Endo/Heme/Allergies: Negative.   Psychiatric/Behavioral: Negative.    Blood pressure 160/54, pulse 80, temperature 97.7 F (36.5 C), temperature source Oral, resp. rate 18, height 5' 5" (1.651 m), weight 90.719 kg (200 lb), SpO2 98.00%. Physical Exam  Constitutional: She appears well-developed and well-nourished.  HENT:  Head: Normocephalic.  Neck: Neck supple. No JVD present. No tracheal deviation present.  Cardiovascular: Normal rate and intact distal pulses.   Respiratory: Effort normal and breath sounds normal. No respiratory distress. She has no wheezes.  GI: Soft. There is no tenderness. There is no guarding.  Musculoskeletal:       Left hip: She exhibits decreased range of motion, decreased strength, tenderness and bony tenderness. She exhibits no crepitus and no laceration.       Right ankle: She exhibits decreased range of motion and swelling. She exhibits no laceration. Tenderness. Lateral malleolus tenderness found.       Feet:  Neurological: She is alert.  Skin: Skin is warm and dry.  Psychiatric: She has a normal mood and affect.    Assessment/Plan: Sub trochanteric fracture on the left hip Oblique nondisplaced fracture of the right distal fibula  Right first metatarsophalangeal fracture    Has been NPO all day, thought she was having surgery earlier today Surgery planned for tonight, has been cleared. Consent obtained To OR soon.       Pricilla Loveless 02/04/2014, 7:58 PM

## 2014-02-04 NOTE — Progress Notes (Signed)
VASCULAR LAB PRELIMINARY  PRELIMINARY  PRELIMINARY  PRELIMINARY   Bilateral lower extremity venous duplex completed.    Preliminary report: Technically limited and difficult exam.  No obvious deep  or superficial vein thrombosis noted bilaterally.   Arden Axon, RVT 02/04/2014, 6:19 PM

## 2014-02-04 NOTE — Consult Note (Signed)
Referring Physician: Dr. Renaldo [name] Singh/  [name] is an 73 y.o. female.                       Reason for consult: Cardiac clearance for hip surgery in patient with Aortic stenosis and abnormal EKG HPI: 73 year old female from [place] suffered a fall in her kitchen. Patient states she was in the kitchen when she turned around her right foot caught something and subsequently she fell, landing on her left side. Patient states she struck her head on the stove and then fell on left side. She now complains of left hip pain, right great toe pain, and dizziness. She states any movements of the LLE worsens her left hip pain. She denies neck pain, back pain. She denies any active CP/SOB before or after fall.  Her echocardiogram showed moderate LVH with mild diastolic dysfunction and mild to moderate Aortic stenosis with 15 mm gradient. Her hypokalemia has been corrected with potassium supplement.   Past Medical History  Diagnosis Date  . HTN (hypertension)   . Diabetes mellitus without complication   . High cholesterol   . Spasm of back muscles       History reviewed. No pertinent past surgical history.  Family History  Problem Relation Age of Onset  . Diabetes Other    Social History:  reports that she has never smoked. She has never used smokeless tobacco. She reports that she does not drink alcohol or use illicit drugs.  Allergies:  Allergies  Allergen Reactions  . Iodine Anaphylaxis  . Penicillins Anaphylaxis  . Shellfish Allergy Anaphylaxis  . Sulfa Antibiotics Anaphylaxis    Medications Prior to Admission  Medication Sig Dispense Refill  . amitriptyline (ELAVIL) 10 MG tablet Take 10 mg by mouth at bedtime.      [name] amLODipine (NORVASC) 5 MG tablet Take 5 mg by mouth daily.      [name] aspirin EC 81 MG tablet Take 162 mg by mouth daily.      [name] atorvastatin (LIPITOR) 20 MG tablet Take 20 mg by mouth at bedtime.      . cetirizine (ZYRTEC) 10 MG tablet Take 10 mg by mouth daily as  needed for allergies.      [name] docusate sodium (COLACE) 100 MG capsule Take 100 mg by mouth 2 (two) times daily.      . fluticasone (FLONASE) 50 MCG/ACT nasal spray Place 1 spray into both nostrils daily.      [name] gabapentin (NEURONTIN) 300 MG capsule Take 300 mg by mouth 2 (two) times daily.      . hydrocortisone (ANUSOL-HC) 2.5 % rectal cream Apply 1 application topically 4 (four) times daily.      . insulin detemir (LEVEMIR) 100 UNIT/ML injection Inject 73 Units into the skin at bedtime.      . insulin glulisine (APIDRA) 100 UNIT/ML injection Inject 6 Units into the skin 3 (three) times daily before meals. 15 minutes before breakfast, lunch, and dinner.      [name] losartan-hydrochlorothiazide (HYZAAR) 100-25 MG per tablet Take 1 tablet by mouth daily.      . meloxicam (MOBIC) 15 MG tablet Take 15 mg by mouth daily as needed for pain.      [name] omeprazole (PRILOSEC) 20 MG capsule Take 20 mg by mouth daily.      . Vitamin D, Ergocalciferol, (DRISDOL) 50000 UNITS CAPS capsule Take 50,000 Units by mouth every 7 (seven) days. For 12 weeks then once  a month.        Results for orders placed during the hospital encounter of 02/03/14 (from the past 48 hour(s))  BASIC METABOLIC PANEL     Status: Abnormal   Collection Time    02/03/14  9:13 AM      Result Value Ref Range   Sodium 144  137 - 147 mEq/L   Potassium 2.9 (*) 3.7 - 5.3 mEq/L   Comment: CRITICAL RESULT CALLED TO, READ BACK BY AND VERIFIED WITH:     CRUISE,J AT 9:50AM ON 02/03/14 BY FESTERMAN,C   Chloride 99  96 - 112 mEq/L   CO2 33 (*) 19 - 32 mEq/L   Glucose, Bld 114 (*) 70 - 99 mg/dL   BUN 15  6 - 23 mg/dL   Creatinine, Ser 0.71  0.50 - 1.10 mg/dL   Calcium 9.7  8.4 - 10.5 mg/dL   GFR calc non Af Amer 85 (*) >90 mL/min   GFR calc Af Amer >90  >90 mL/min   Comment: (NOTE)     The eGFR has been calculated using the CKD EPI equation.     This calculation has not been validated in all clinical situations.     eGFR's persistently <90 mL/min  signify possible Chronic Kidney     Disease.   Anion gap 12  5 - 15  CBC WITH DIFFERENTIAL     Status: Abnormal   Collection Time    02/03/14  9:13 AM      Result Value Ref Range   WBC 14.2 (*) 4.0 - 10.5 K/uL   RBC 5.41 (*) 3.87 - 5.11 MIL/uL   Hemoglobin 14.2  12.0 - 15.0 g/dL   HCT 44.0  36.0 - 46.0 %   MCV 81.3  78.0 - 100.0 fL   MCH 26.2  26.0 - 34.0 pg   MCHC 32.3  30.0 - 36.0 g/dL   RDW 15.7 (*) 11.5 - 15.5 %   Platelets 195  150 - 400 K/uL   Neutrophils Relative % 79 (*) 43 - 77 %   Neutro Abs 11.2 (*) 1.7 - 7.7 K/uL   Lymphocytes Relative 13  12 - 46 %   Lymphs Abs 1.8  0.7 - 4.0 K/uL   Monocytes Relative 5  3 - 12 %   Monocytes Absolute 0.7  0.1 - 1.0 K/uL   Eosinophils Relative 3  0 - 5 %   Eosinophils Absolute 0.5  0.0 - 0.7 K/uL   Basophils Relative 0  0 - 1 %   Basophils Absolute 0.1  0.0 - 0.1 K/uL  PROTIME-INR     Status: None   Collection Time    02/03/14  9:13 AM      Result Value Ref Range   Prothrombin Time 13.5  11.6 - 15.2 seconds   INR 1.03  0.00 - 1.49  TROPONIN I     Status: None   Collection Time    02/03/14  9:13 AM      Result Value Ref Range   Troponin I <0.30  <0.30 ng/mL   Comment:            Due to the release kinetics of cTnI,     a negative result within the first hours     of the onset of symptoms does not rule out     myocardial infarction with certainty.     If myocardial infarction is still suspected,     repeat the test at appropriate intervals.  HEMOGLOBIN A1C     Status: Abnormal   Collection Time    02/03/14  9:13 AM      Result Value Ref Range   Hemoglobin A1C 11.8 (*) <5.7 %   Comment: (NOTE)                                                                               According to the ADA Clinical Practice Recommendations for 2011, when     HbA1c is used as a screening test:      >=6.5%   Diagnostic of Diabetes Mellitus               (if abnormal result is confirmed)     5.7-6.4%   Increased risk of developing  Diabetes Mellitus     References:Diagnosis and Classification of Diabetes Mellitus,Diabetes     JSEG,3151,76(HYWVP 1):S62-S69 and Standards of Medical Care in             Diabetes - 2011,Diabetes Care,2011,34 (Suppl 1):S11-S61.   Mean Plasma Glucose 292 (*) <117 mg/dL   Comment: Performed at [facility]     Status: None   Collection Time    02/03/14  9:40 AM      Result Value Ref Range   ABO/RH(D) O POS     Antibody Screen NEG     Sample Expiration 02/06/2014     Unit Number [id number]     Blood Component Type RED CELLS,LR     Unit division 00     Status of Unit ALLOCATED     Transfusion Status OK TO TRANSFUSE     Crossmatch Result Compatible     Unit Number [id number]     Blood Component Type RBC LR PHER1     Unit division 00     Status of Unit ALLOCATED     Transfusion Status OK TO TRANSFUSE     Crossmatch Result Compatible    PREPARE RBC (CROSSMATCH)     Status: None   Collection Time    02/03/14  9:40 AM      Result Value Ref Range   Order Confirmation ORDER PROCESSED BY BLOOD BANK    ABO/RH     Status: None   Collection Time    02/03/14  9:40 AM      Result Value Ref Range   ABO/RH(D) O POS    CBG MONITORING, ED     Status: Abnormal   Collection Time    02/03/14 12:40 PM      Result Value Ref Range   Glucose-Capillary 165 (*) 70 - 99 mg/dL  GLUCOSE, CAPILLARY     Status: Abnormal   Collection Time    02/03/14  4:36 PM      Result Value Ref Range   Glucose-Capillary 215 (*) 70 - 99 mg/dL   Comment 1 Notify RN    MRSA PCR SCREENING     Status: None   Collection Time    02/03/14  7:03 PM      Result Value Ref Range   MRSA by PCR NEGATIVE  NEGATIVE   Comment:            The  GeneXpert MRSA Assay (FDA     approved for NASAL specimens     only), is one component of a     comprehensive MRSA colonization     surveillance program. It is not     intended to diagnose MRSA     infection nor to guide or     monitor treatment for      MRSA infections.  GLUCOSE, CAPILLARY     Status: Abnormal   Collection Time    02/03/14  9:35 PM      Result Value Ref Range   Glucose-Capillary 274 (*) 70 - 99 mg/dL  BASIC METABOLIC PANEL     Status: Abnormal   Collection Time    02/04/14  5:53 AM      Result Value Ref Range   Sodium 142  137 - 147 mEq/L   Potassium 4.0  3.7 - 5.3 mEq/L   Comment: DELTA CHECK NOTED   Chloride 100  96 - 112 mEq/L   CO2 34 (*) 19 - 32 mEq/L   Glucose, Bld 195 (*) 70 - 99 mg/dL   BUN 13  6 - 23 mg/dL   Creatinine, Ser 0.73  0.50 - 1.10 mg/dL   Calcium 8.8  8.4 - 10.5 mg/dL   GFR calc non Af Amer 84 (*) >90 mL/min   GFR calc Af Amer >90  >90 mL/min   Comment: (NOTE)     The eGFR has been calculated using the CKD EPI equation.     This calculation has not been validated in all clinical situations.     eGFR's persistently <90 mL/min signify possible Chronic Kidney     Disease.   Anion gap 8  5 - 15  CBC     Status: Abnormal   Collection Time    02/04/14  5:53 AM      Result Value Ref Range   WBC 11.9 (*) 4.0 - 10.5 K/uL   RBC 5.10  3.87 - 5.11 MIL/uL   Hemoglobin 13.2  12.0 - 15.0 g/dL   HCT 42.7  36.0 - 46.0 %   MCV 83.7  78.0 - 100.0 fL   MCH 25.9 (*) 26.0 - 34.0 pg   MCHC 30.9  30.0 - 36.0 g/dL   RDW 16.0 (*) 11.5 - 15.5 %   Platelets 189  150 - 400 K/uL  GLUCOSE, CAPILLARY     Status: Abnormal   Collection Time    02/04/14  7:16 AM      Result Value Ref Range   Glucose-Capillary 177 (*) 70 - 99 mg/dL   Comment 1 Notify RN    GLUCOSE, CAPILLARY     Status: Abnormal   Collection Time    02/04/14  9:27 AM      Result Value Ref Range   Glucose-Capillary 212 (*) 70 - 99 mg/dL   Comment 1 Notify RN     Dg Chest 1 View  02/03/2014   CLINICAL DATA:  Fall.  Hip injury  EXAM: CHEST - 1 VIEW  COMPARISON:  None.  FINDINGS: Cardiac enlargement without heart failure. Lungs are clear. No displaced rib fracture.  IMPRESSION: Cardiac enlargement.  No active cardiopulmonary disease.    Electronically Signed   By: Franchot Gallo M.D.   On: 02/03/2014 10:54   Dg Hip Complete Left  02/03/2014   CLINICAL DATA:  Fall  EXAM: LEFT HIP - COMPLETE 2+ VIEW  COMPARISON:  None.  FINDINGS: Sub trochanteric fracture on the left with angulation  and medial displacement. Left hip joint appears normal. No other fracture.  IMPRESSION: Displaced subtrochanteric fracture left femur   Electronically Signed   By: Franchot Gallo M.D.   On: 02/03/2014 10:55   Dg Ankle Complete Right  02/03/2014   CLINICAL DATA:  Fall  EXAM: RIGHT ANKLE - COMPLETE 3+ VIEW  COMPARISON:  None.  FINDINGS: Oblique nondisplaced fracture of the distal fibula. No fracture of the tibia. Ankle mortise intact.  Arterial calcification.  Calcaneal spurring.  IMPRESSION: Nondisplaced fracture distal fibula.   Electronically Signed   By: Franchot Gallo M.D.   On: 02/03/2014 10:59   Ct Head Wo Contrast  02/03/2014   CLINICAL DATA:  Fall  EXAM: CT HEAD WITHOUT CONTRAST  TECHNIQUE: Contiguous axial images were obtained from the base of the skull through the vertex without intravenous contrast.  COMPARISON:  None.  FINDINGS: No skull fracture is noted. The mastoid air cells are unremarkable. There is mucosal thickening with partial opacification left ethmoid air cells.  Mild cerebral atrophy. Mild periventricular white matter decreased attenuation probable due to chronic small vessel ischemic changes. No acute cortical infarction. No mass lesion is noted on this unenhanced scan.  IMPRESSION: No acute intracranial abnormality. Mild cerebral atrophy. Periventricular white matter decreased attenuation probable due to chronic small vessel ischemic changes.   Electronically Signed   By: Lahoma Crocker M.D.   On: 02/03/2014 11:13   Dg Knee Complete 4 Views Left  02/03/2014   CLINICAL DATA:  Fall  EXAM: LEFT KNEE - COMPLETE 4+ VIEW  COMPARISON:  None.  FINDINGS: There is no evidence of fracture, dislocation, or joint effusion. There is no evidence of  arthropathy or other focal bone abnormality. Soft tissues are unremarkable.  IMPRESSION: Negative.   Electronically Signed   By: Franchot Gallo M.D.   On: 02/03/2014 10:58   Dg Foot 2 Views Right  02/03/2014   CLINICAL DATA:  Postreduction.  EXAM: RIGHT FOOT - 2 VIEW  COMPARISON:  Right foot radiograph February 03, 2014 at 1018 hr.  FINDINGS: Suspected residual subluxation without frank dislocation of first metatarsophalangeal fracture dislocation seen on prior radiograph. Dorsal foot soft tissue swelling without subcutaneous gas radiopaque foreign bodies. Moderate vascular calcifications.  IMPRESSION: Improved alignment of first metatarsophalangeal fracture dislocation.   Electronically Signed   By: Elon Alas   On: 02/03/2014 12:33   Dg Foot Complete Right  02/03/2014   CLINICAL DATA:  Fall  EXAM: RIGHT FOOT COMPLETE - 3+ VIEW  COMPARISON:  None.  FINDINGS: Dorsal dislocation of the first metatarsophalangeal joint. Possible small avulsion fracture.  No other fracture.  Diffuse arterial calcification.  IMPRESSION: Dislocation of the first MTP   Electronically Signed   By: Franchot Gallo M.D.   On: 02/03/2014 10:57     Review of Systems  Pulmonary: No asthma or COPD. Cardiovascular-No chest pain. Positive for dizziness. No palpitation. No syncope GI/GU: No bleed. Musculoskeletal: Positive for arthralgias . Positive for back pain, spasms and neck pain.  Neurological: Positive for dizziness. Negative for syncope.  All other systems reviewed and are negative.   Blood pressure 160/54, pulse 80, temperature 97.7 F (36.5 C), temperature source Oral, resp. rate 18, height '5\' 5"'  (1.651 m), weight 90.719 kg (200 lb), SpO2 98.00%. Physical Exam  Nursing note and vitals reviewed.  CONSTITUTIONAL: Well developed/well nourished. Alert. Appears stated age.  HEAD: Normocephalic/atraumatic.  EYES: Owens Shark, Conj-pnk, Sclera-white. EOMI/PERRL  ENMT: Mucous membranes moist, No evidence of facial/nasal trauma   NECK: supple  no meningeal signs  CV: S1/S2 noted, III/VI systolic murmur right sternal border. No/rubs/gallops noted  LUNGS: Lungs are clear to auscultation bilaterally.  ABDOMEN: obese, soft, non-tender.  NEURO: Pt is awake/alert. Cranial nerves grossly intact.  EXTREMITIES: tenderness to right great toe and right foot. LLE external rotation noted, tenderness to left hip and left knee, distal cap refill <2- 3 seconds in both feet. Able to move all toes in left foot. Mild swelling of both lower extremities.  SKIN: warm, color normal  PSYCH: Mild anxiety noted.    Assessment/Plan Moderate aortic stenosis Abnormal EKG LV hypertrophy with repolarization changes or inferior ischemia. Acute displaced left hip (Femur) subtrochanteric fracture Acute right first metatarsophalangeal fracture dislocation. Acute nondisplaced fracture of the distal right fibula DM, II, uncontrolled Hypertension Morbid obesity Hypokalemia-Corrected  In absence of chest pain before and after fall will clear the patient for left hip surgery and rely on excellent hemodynamic monitoring during the surgery. Patient and family agree to the plan of additional ischemia work-up and aortic stenosis evaluation after the hip surgery and when patient is ready.    Bayan Hedstrom S 02/04/2014, 4:53 PM

## 2014-02-04 NOTE — Progress Notes (Signed)
Attempted to explain consent for blood to patient as she is oriented to person, place, time, and situation; however patient displayed poor attention and inability to focus on the explanation and signing of consent. Husband signed informed consent for procedure the day prior, so husband was called. We received verbal consent from him through two RNs.

## 2014-02-04 NOTE — OR Nursing (Signed)
Planned operative procedure cancelled.Plan is to transfer to [facility].

## 2014-02-04 NOTE — Consult Note (Signed)
Agree with above.  Reviewed specific challenges involving this type of fracture with regards to healing, function etc.  Risks and benefits reviewed as pertinent to questions addressed by family.  To OR tonight for ORIF, consent signed for ORIF

## 2014-02-04 NOTE — Care Management Note (Signed)
    Page 1 of 1   02/04/2014     11:51:42 AM CARE MANAGEMENT NOTE 02/04/2014  Patient:  [name], [name]   Account Number:  [id number]  Date Initiated:  02/04/2014  Documentation initiated by:  Vladimir Creeks  Subjective/Objective Assessment:   Admitted with multiple Fx from a fall. Pt is from home, with spouse, but will need SNF for rehab following surgery. CSW aware and is following     Action/Plan:   Pt to be transferred to [facility] for surgery   Anticipated DC Date:  02/04/2014   Anticipated DC Plan:  ACUTE TO ACUTE TRANS  In-house referral  Clinical Social Worker      DC Planning Services  CM consult      Choice offered to / List presented to:             Status of service:  Completed, signed off Medicare Important Message given?  NA - LOS <3 / Initial given by admissions (If response is "NO", the following Medicare IM given date fields will be blank) Date Medicare IM given:   Medicare IM given by:   Date Additional Medicare IM given:   Additional Medicare IM given by:    Discharge Disposition:    Per UR Regulation:  Reviewed for med. necessity/level of care/duration of stay  If discussed at [place] of Stay Meetings, dates discussed:    Comments:  02/04/14 [place] RN/CM

## 2014-02-05 ENCOUNTER — Inpatient Hospital Stay (HOSPITAL_COMMUNITY): Payer: Medicare HMO

## 2014-02-05 LAB — GLUCOSE, CAPILLARY
GLUCOSE-CAPILLARY: 300 mg/dL — AB (ref 70–99)
GLUCOSE-CAPILLARY: 224 mg/dL — AB (ref 70–99)
Glucose-Capillary: 173 mg/dL — ABNORMAL HIGH (ref 70–99)
Glucose-Capillary: 222 mg/dL — ABNORMAL HIGH (ref 70–99)
Glucose-Capillary: 241 mg/dL — ABNORMAL HIGH (ref 70–99)
Glucose-Capillary: 397 mg/dL — ABNORMAL HIGH (ref 70–99)

## 2014-02-05 LAB — URINE MICROSCOPIC-ADD ON

## 2014-02-05 LAB — URINALYSIS, ROUTINE W REFLEX MICROSCOPIC
Glucose, UA: >1000 mg/dL — AB
Ketones, ur: 15 mg/dL — AB
Nitrite: NEGATIVE
PH: 6 (ref 5.0–8.0)
PROTEIN: NEGATIVE mg/dL
Specific Gravity, Urine: 1.018 (ref 1.005–1.030)
Urobilinogen, UA: 1 mg/dL (ref 0.0–1.0)

## 2014-02-05 LAB — CBC
HCT: 41.4 % (ref 36.0–46.0)
Hemoglobin: 12.9 g/dL (ref 12.0–15.0)
MCH: 26.2 pg (ref 26.0–34.0)
MCHC: 31.2 g/dL (ref 30.0–36.0)
MCV: 84.1 fL (ref 78.0–100.0)
PLATELETS: 159 10*3/uL (ref 150–400)
RBC: 4.92 MIL/uL (ref 3.87–5.11)
RDW: 16.3 % — ABNORMAL HIGH (ref 11.5–15.5)
WBC: 14.7 10*3/uL — ABNORMAL HIGH (ref 4.0–10.5)

## 2014-02-05 LAB — BASIC METABOLIC PANEL
ANION GAP: 18 — AB (ref 5–15)
BUN: 13 mg/dL (ref 6–23)
CALCIUM: 9 mg/dL (ref 8.4–10.5)
CO2: 27 mEq/L (ref 19–32)
Chloride: 99 mEq/L (ref 96–112)
Creatinine, Ser: 0.65 mg/dL (ref 0.50–1.10)
GFR, EST NON AFRICAN AMERICAN: 87 mL/min — AB (ref >90)
Glucose, Bld: 253 mg/dL — ABNORMAL HIGH (ref 70–99)
Potassium: 4.1 mEq/L (ref 3.7–5.3)
SODIUM: 144 meq/L (ref 137–147)

## 2014-02-05 MED ORDER — ENOXAPARIN SODIUM 40 MG/0.4ML ~~LOC~~ SOLN
40.0000 mg | SUBCUTANEOUS | Status: AC
  Administered 2014-02-06 – 2014-02-07 (×2): 40 mg via SUBCUTANEOUS
  Filled 2014-02-05 (×3): qty 0.4

## 2014-02-05 MED ORDER — OXYCODONE HCL 5 MG PO TABS
5.0000 mg | ORAL_TABLET | ORAL | Status: DC | PRN
  Administered 2014-02-05 – 2014-02-10 (×17): 5 mg via ORAL
  Filled 2014-02-05 (×17): qty 1

## 2014-02-05 MED ORDER — HYDROMORPHONE HCL PF 1 MG/ML IJ SOLN: 1.0000 mg | INTRAMUSCULAR | Status: DC | PRN

## 2014-02-05 MED ORDER — ONDANSETRON HCL 4 MG/2ML IJ SOLN
4.0000 mg | Freq: Once | INTRAMUSCULAR | Status: DC | PRN
Start: 2014-02-05 — End: 2014-02-05

## 2014-02-05 MED ORDER — INSULIN ASPART 100 UNIT/ML ~~LOC~~ SOLN
0.0000 [IU] | Freq: Every day | SUBCUTANEOUS | Status: DC
  Administered 2014-02-05: 5 [IU] via SUBCUTANEOUS

## 2014-02-05 MED ORDER — LORAZEPAM 2 MG/ML IJ SOLN
0.5000 mg | Freq: Once | INTRAMUSCULAR | Status: AC
  Administered 2014-02-05: 0.5 mg via INTRAVENOUS
  Filled 2014-02-05: qty 1

## 2014-02-05 MED ORDER — FUROSEMIDE 40 MG PO TABS
40.0000 mg | ORAL_TABLET | Freq: Once | ORAL | Status: AC
  Administered 2014-02-05: 40 mg via ORAL
  Filled 2014-02-05: qty 1

## 2014-02-05 MED ORDER — HYDROMORPHONE HCL PF 1 MG/ML IJ SOLN: 0.2500 mg | INTRAMUSCULAR | Status: DC | PRN

## 2014-02-05 MED ORDER — HALOPERIDOL LACTATE 5 MG/ML IJ SOLN
1.0000 mg | Freq: Four times a day (QID) | INTRAMUSCULAR | Status: DC | PRN
Start: 2014-02-05 — End: 2014-02-10

## 2014-02-05 MED ORDER — INSULIN DETEMIR 100 UNIT/ML ~~LOC~~ SOLN
30.0000 [IU] | Freq: Two times a day (BID) | SUBCUTANEOUS | Status: DC
  Administered 2014-02-05 (×2): 30 [IU] via SUBCUTANEOUS
  Filled 2014-02-05 (×4): qty 0.3

## 2014-02-05 MED ORDER — HYDROCHLOROTHIAZIDE 25 MG PO TABS
25.0000 mg | ORAL_TABLET | Freq: Every day | ORAL | Status: DC
  Administered 2014-02-06 – 2014-02-10 (×4): 25 mg via ORAL
  Filled 2014-02-05 (×5): qty 1

## 2014-02-05 MED ORDER — INSULIN ASPART 100 UNIT/ML ~~LOC~~ SOLN
0.0000 [IU] | Freq: Three times a day (TID) | SUBCUTANEOUS | Status: DC
  Administered 2014-02-06 (×2): 8 [IU] via SUBCUTANEOUS
  Administered 2014-02-06: 5 [IU] via SUBCUTANEOUS
  Administered 2014-02-07: 3 [IU] via SUBCUTANEOUS
  Administered 2014-02-07: 5 [IU] via SUBCUTANEOUS

## 2014-02-05 MED ORDER — HYDROMORPHONE HCL PF 1 MG/ML IJ SOLN
0.5000 mg | INTRAMUSCULAR | Status: DC | PRN
Start: 2014-02-05 — End: 2014-02-05

## 2014-02-05 MED ORDER — OXYCODONE HCL 5 MG/5ML PO SOLN: 5.0000 mg | Freq: Once | ORAL | Status: DC | PRN

## 2014-02-05 MED ORDER — WHITE PETROLATUM GEL
Status: AC
  Administered 2014-02-05: 1
  Filled 2014-02-05: qty 5

## 2014-02-05 MED ORDER — GABAPENTIN 100 MG PO CAPS
100.0000 mg | ORAL_CAPSULE | Freq: Two times a day (BID) | ORAL | Status: DC
  Administered 2014-02-05 – 2014-02-10 (×10): 100 mg via ORAL
  Filled 2014-02-05 (×13): qty 1

## 2014-02-05 MED ORDER — OXYCODONE HCL 5 MG PO TABS: 5.0000 mg | ORAL_TABLET | Freq: Once | ORAL | Status: DC | PRN

## 2014-02-05 NOTE — Transfer of Care (Signed)
Immediate Anesthesia Transfer of Care Note  Patient: [name]  Procedure(s) Performed: Procedure(s): INTRAMEDULLARY (IM) NAIL INTERTROCHANTRIC FEMORAL (Left)  Patient Location: PACU  Anesthesia Type:GA combined with regional for post-op pain  Level of Consciousness: awake and confused  Airway & Oxygen Therapy: Patient connected to face mask oxygen  Post-op Assessment: Report given to PACU RN, Post -op Vital signs reviewed and stable and Patient moving all extremities X 4  Post vital signs: Reviewed and stable  Complications: No apparent anesthesia complications

## 2014-02-05 NOTE — Progress Notes (Signed)
RN went in to reassess the patients soft wrist restraints at 0645. The patient is resting peacefully. The RN asked the daughter if she would like the restraints to be removed since the 2 hour minimum had been met according to the daughters goals. The daughter stated that she wanted to take a shower first and to come back later and she would let the RN know. RN will continue to assess.

## 2014-02-05 NOTE — Evaluation (Signed)
Physical Therapy Evaluation Patient Details Name: [name] MRN: [id number] DOB: [birth year] Today's Date: 02/05/2014   History of Present Illness  pt presents post fall sustaining R fibula and 1st metatarsal fxs and L subtrochanteric fx post ORIF.    Clinical Impression  Pt participation limited by confusion this am and pt easily agitated.  Attempted transfer to chair x2 with 2-3 staff members, however pt not participating in mobility and at times becomes resistant.  Andrew for OOB to chair.  Daughter initially unhappy about pt being unable to transfer to chair, however educated family on need for lift equipment for pt and staff safety at this time.  Will continue to follow.      Follow Up Recommendations SNF    Equipment Recommendations   (TBD)    Recommendations for Other Services       Precautions / Restrictions Precautions Precautions: Fall Restrictions Weight Bearing Restrictions: Yes RLE Weight Bearing: Non weight bearing LLE Weight Bearing: Weight bearing as tolerated      Mobility  Bed Mobility Overal bed mobility: Needs Assistance;+2 for physical assistance Bed Mobility: Supine to Sit     Supine to sit: Total assist;+2 for physical assistance;HOB elevated     General bed mobility comments: pt not participating in bed mobility and at times resistant.    Transfers Overall transfer level: Needs assistance Equipment used: 2 person hand held assist Transfers: Sit to/from Stand Sit to Stand: Total assist;+2 physical assistance         General transfer comment: Attempted to stand x2 with pt not participatin in mobility.  pt resistant and leans posteriorly requiring 3rd person to A with scooting pt's hips back on to bed x2.  pt not following any directions.  Utilized MAxi move for OOB to chair.    Ambulation/Gait                Stairs            Wheelchair Mobility    Modified Rankin (Stroke Patients Only)       Balance Overall  balance assessment: Needs assistance Sitting-balance support: Bilateral upper extremity supported;Feet supported Sitting balance-Leahy Scale: Poor     Standing balance support: Bilateral upper extremity supported Standing balance-Leahy Scale: Zero                               Pertinent Vitals/Pain Pt confused and unable to rate.  Yells out.  RN aware.      Home Living Family/patient expects to be discharged to:: Skilled nursing facility                      Prior Function Level of Independence: Independent               Hand Dominance        Extremity/Trunk Assessment   Upper Extremity Assessment: Overall WFL for tasks assessed           Lower Extremity Assessment: RLE deficits/detail;LLE deficits/detail;Difficult to assess due to impaired cognition RLE Deficits / Details: In lower leg cast.  pt not assisting with movement.   LLE Deficits / Details: Painful at L hip and difficult to assess 2/2 pain and cognition.       Communication   Communication: No difficulties  Cognition Arousal/Alertness: Awake/alert Behavior During Therapy:  (Easily agitated and can be combative) Overall Cognitive Status: Impaired/Different from baseline Area of Impairment: Orientation;Attention;Memory;Following  commands;Safety/judgement;Awareness;Problem solving Orientation Level: Disoriented to;Situation;Time Current Attention Level: Focused Memory: Decreased short-term memory;Decreased recall of precautions Following Commands: Follows one step commands inconsistently Safety/Judgement: Decreased awareness of safety;Decreased awareness of deficits Awareness: Emergent;Anticipatory Problem Solving: Slow processing;Decreased initiation;Difficulty sequencing;Requires verbal cues;Requires tactile cues General Comments: pt confused and easily agitated.  Hitting at husband and staff.  pt not recognizing her daughter.      General Comments      Exercises         Assessment/Plan    PT Assessment Patient needs continued PT services  PT Diagnosis Difficulty walking;Acute pain   PT Problem List Decreased strength;Decreased activity tolerance;Decreased balance;Decreased mobility;Decreased cognition;Decreased knowledge of use of DME;Decreased safety awareness;Decreased knowledge of precautions;Pain;Obesity  PT Treatment Interventions DME instruction;Gait training;Functional mobility training;Therapeutic activities;Therapeutic exercise;Balance training;Cognitive remediation;Patient/family education   PT Goals (Current goals can be found in the Care Plan section) Acute Rehab PT Goals Patient Stated Goal: None stated.   PT Goal Formulation: With family Time For Goal Achievement: 02/19/14 Potential to Achieve Goals: Fair    Frequency Min 3X/week   Barriers to discharge        Co-evaluation               End of Session Equipment Utilized During Treatment: Gait belt Activity Tolerance: Treatment limited secondary to agitation Patient left: in chair;with call bell/phone within reach;with family/visitor present Nurse Communication: Mobility status;Need for lift equipment         Time: 0802-0843 PT Time Calculation (min): 41 min   Charges:   PT Evaluation $Initial PT Evaluation Tier I: 1 Procedure PT Treatments $Therapeutic Activity: 38-52 mins   PT G CodesCatarina Hartshorn, [place] 02/05/2014, 1:08 PM

## 2014-02-05 NOTE — Progress Notes (Signed)
IV team came to place new IV site for patient in the left upper arm. IV team RN and patients RN at the bedside. 1 successful attempt to place.

## 2014-02-05 NOTE — Progress Notes (Signed)
Patient Demographics  [name], is a 73 y.o. female, DOB - 03-25-1941, CXK:[id number]  Admit date - 02/03/2014   Admitting Physician Mauri Pole, MD  Outpatient Primary MD for the patient is PROVIDER NOT [place]  LOS - 2   Chief Complaint  Patient presents with  . Fall  . Hip Injury         Subjective:   [name] today has, No headache, No chest pain, No abdominal pain - No Nausea, No new weakness tingling or numbness, No Cough - SOB.     Assessment & Plan    1. Mech fall with left intertrochanteric fracture, right distal fibula and first metatarsophalangeal joint fracture.   Seen by Dr. Alvan Dame underwent open reduction internal fixation of left intertrochanteric fracture. The right Leg on the ACE splint, current weight bearing status is no weight bearing on the right partial weightbearing on the left, with patient's weight and delirium or any activity is very high risk for fall. Will require placement, PT following, DVT prophylaxis Lovenox from 02/06/2014 per orthopedics. SCDs continued.     2. Mild aortic stenosis noted on echogram done in regional with nonspecific EKG changes. Seen by cardiologist Dr. Doylene Canard, will place her on her home dose aspirin 81 mg from today, no acute issues. Chest pain-free we'll follow with Dr. Doylene Canard postdischarge.     3. Delirium. Minimize narcotics and benzodiazepines, QTC is stable, as needed Haldol. Fall and aspiration precautions. Family educated bedside. They are okay with restraints as needed.    4. Hypertension. Stable currently on Norvasc, as needed hydrazine ordered.     5. DM type II. Outpatient control poor, A1c greater than 11. We'll place her on split dose Levimir 35 units twice a day, she takes 70 units at night at home since her  by mouth status is labile due to delirium, sliding scale insulin.  CBG (last 3)   Recent Labs  02/05/14 0030 02/05/14 0212 02/05/14 0627  GLUCAP 173* 241* 222*       5. Dyslipidemia. Home dose statin continued.      6. Underlying chronic diastolic CHF with mild acute decompensation as evident on chest x-ray and physical exam. Low-dose Lasix, fluid and salt restriction, monitor clinically. EF is 60% with mild aortic stenosis on echogram.      7. Lower extremity edema. Due to mild acute on chronic diastolic CHF, diuresis with Lasix, lower extremity venous duplex unremarkable.      8. Mild leukocytosis. She is afebrile, chest x-ray does not show definite infiltrate, will check UA, monitor.     9.GERD - On PPI       Code Status: Full  Family Communication: Husband and daughter bedside 02/05/2014  Disposition Plan: Likely SNF    Procedures  ORIF L Hip 02-04-14 Dr Alvan Dame   Echo    Impressions:  - Moderate to severe LVH with LVEF 97-02%, grade 1 diastolic dysfunction. Severe left atrial enlargement. 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 Unable to assess PASP    Consults  Ortho, Cards   Medications  Scheduled Meds: . amitriptyline  10 mg Oral QHS  . amLODipine  5 mg Oral Daily  . atorvastatin  20 mg Oral  QHS  . docusate sodium  100 mg Oral BID  . [START ON 02/06/2014] enoxaparin (LOVENOX) injection  40 mg Subcutaneous Q24H  . fluticasone  1 spray Each Nare Daily  . furosemide  40 mg Oral Once  . gabapentin  100 mg Oral BID  . hydrocortisone  1 application Topical QID  . insulin aspart  0-15 Units Subcutaneous TID WC  . insulin detemir  30 Units Subcutaneous QHS  . pantoprazole  40 mg Oral Daily  . white petrolatum       Continuous Infusions:  PRN Meds:.acetaminophen, alum & mag hydroxide-simeth, haloperidol lactate, hydrALAZINE, HYDROmorphone (DILAUDID) injection, ondansetron (ZOFRAN) IV, oxyCODONE,  zolpidem    DVT Prophylaxis  Lovenox   - SCDs   Lab Results  Component Value Date   PLT 159 02/05/2014      Antibiotics     Anti-infectives   Start     Dose/Rate Route Frequency Ordered Stop   02/04/14 2116  ceFAZolin (ANCEF) 2-3 GM-% IVPB SOLR    Comments:  Ubaldo Glassing   : cabinet override      02/04/14 2116 02/05/14 0929   02/04/14 0600  vancomycin (VANCOCIN) IVPB 1000 mg/200 mL premix  Status:  Discontinued     1,000 mg 200 mL/hr over 60 Minutes Intravenous On call to O.R. 02/03/14 1751 02/04/14 2304          Objective:   Filed Vitals:   02/05/14 0100 02/05/14 0115 02/05/14 0138 02/05/14 0626  BP:  156/59 141/46 111/43  Pulse: 103 110 98 100  Temp:    98.2 F (36.8 C)  TempSrc:    Oral  Resp: 20 19 18 18   Height:      Weight:      SpO2: 99% 93% 96%     Wt Readings from Last 3 Encounters:  02/04/14 90.719 kg (200 lb)  02/04/14 90.719 kg (200 lb)  02/04/14 90.719 kg (200 lb)     Intake/Output Summary (Last 24 hours) at 02/05/14 0936 Last data filed at 02/05/14 0626  Gross per 24 hour  Intake   1700 ml  Output    725 ml  Net    975 ml     Physical Exam  Awake but pleasantly confused, No new F.N deficits, Normal affect [place].AT,PERRAL Supple Neck,No JVD, No cervical lymphadenopathy appriciated.  Symmetrical Chest wall movement, Good air movement bilaterally, CTAB RRR,No Gallops,Rubs or new Murmurs, No Parasternal Heave +ve B.Sounds, Abd Soft, No tenderness, No organomegaly appriciated, No rebound - guarding or rigidity. No Cyanosis, Clubbing , No new Rash or bruise, L.Leg in ACE wrap, trace bipedal edema     Data Review   Micro Results Recent Results (from the past 240 hour(s))  MRSA PCR SCREENING     Status: None   Collection Time    02/03/14  7:03 PM      Result Value Ref Range Status   MRSA by PCR NEGATIVE  NEGATIVE Final   Comment:            The GeneXpert MRSA Assay (FDA     approved for NASAL specimens     only), is one  component of a     comprehensive MRSA colonization     surveillance program. It is not     intended to diagnose MRSA     infection nor to guide or     monitor treatment for     MRSA infections.    Radiology Reports Dg Chest 1 View  02/03/2014  CLINICAL DATA:  Fall.  Hip injury  EXAM: CHEST - 1 VIEW  COMPARISON:  None.  FINDINGS: Cardiac enlargement without heart failure. Lungs are clear. No displaced rib fracture.  IMPRESSION: Cardiac enlargement.  No active cardiopulmonary disease.   Electronically Signed   By: Franchot Gallo M.D.   On: 02/03/2014 10:54   Dg Hip Complete Left  02/03/2014   CLINICAL DATA:  Fall  EXAM: LEFT HIP - COMPLETE 2+ VIEW  COMPARISON:  None.  FINDINGS: Sub trochanteric fracture on the left with angulation and medial displacement. Left hip joint appears normal. No other fracture.  IMPRESSION: Displaced subtrochanteric fracture left femur   Electronically Signed   By: Franchot Gallo M.D.   On: 02/03/2014 10:55   Dg Femur Left  02/04/2014   CLINICAL DATA:  Intra medullary nail fixation  EXAM: LEFT FEMUR - 2 VIEW  COMPARISON:  02/03/2014  FINDINGS: Status post open reduction and internal fixation of a subtrochanteric left femur fracture. There is no evidence intra procedural complication. The hip is located.  IMPRESSION: Proximal left femur fracture ORIF.  No adverse findings.   Electronically Signed   By: Jorje Guild M.D.   On: 02/04/2014 23:47   Dg Ankle Complete Right  02/03/2014   CLINICAL DATA:  Fall  EXAM: RIGHT ANKLE - COMPLETE 3+ VIEW  COMPARISON:  None.  FINDINGS: Oblique nondisplaced fracture of the distal fibula. No fracture of the tibia. Ankle mortise intact.  Arterial calcification.  Calcaneal spurring.  IMPRESSION: Nondisplaced fracture distal fibula.   Electronically Signed   By: Franchot Gallo M.D.   On: 02/03/2014 10:59   Ct Head Wo Contrast  02/03/2014   CLINICAL DATA:  Fall  EXAM: CT HEAD WITHOUT CONTRAST  TECHNIQUE: Contiguous axial images were obtained  from the base of the skull through the vertex without intravenous contrast.  COMPARISON:  None.  FINDINGS: No skull fracture is noted. The mastoid air cells are unremarkable. There is mucosal thickening with partial opacification left ethmoid air cells.  Mild cerebral atrophy. Mild periventricular white matter decreased attenuation probable due to chronic small vessel ischemic changes. No acute cortical infarction. No mass lesion is noted on this unenhanced scan.  IMPRESSION: No acute intracranial abnormality. Mild cerebral atrophy. Periventricular white matter decreased attenuation probable due to chronic small vessel ischemic changes.   Electronically Signed   By: Lahoma Crocker M.D.   On: 02/03/2014 11:13   Dg Chest Port 1 View  02/05/2014   CLINICAL DATA:  Cough.  Shortness of breath.  But fracture.  EXAM: PORTABLE CHEST - 1 VIEW  COMPARISON:  One-view chest 02/03/2014.  FINDINGS: The heart is enlarged. Moderate pulmonary vascular congestion is now present. Mild atelectasis is evident at the bases. No significant airspace consolidation is present. The visualized soft tissues and bony thorax are unremarkable.  IMPRESSION: 1. Stable cardiomegaly with new moderate pulmonary vascular congestion. Early failure is considered. 2. Mild bibasilar atelectasis without other significant airspace disease.   Electronically Signed   By: Lawrence Santiago M.D.   On: 02/05/2014 08:05   Dg Knee Complete 4 Views Left  02/03/2014   CLINICAL DATA:  Fall  EXAM: LEFT KNEE - COMPLETE 4+ VIEW  COMPARISON:  None.  FINDINGS: There is no evidence of fracture, dislocation, or joint effusion. There is no evidence of arthropathy or other focal bone abnormality. Soft tissues are unremarkable.  IMPRESSION: Negative.   Electronically Signed   By: Franchot Gallo M.D.   On: 02/03/2014 10:58  Dg Ankle Right Port  02/05/2014   CLINICAL DATA:  Right fibula fracture.  EXAM: PORTABLE RIGHT ANKLE - 2 VIEW  COMPARISON:  02/03/2014.  FINDINGS: A  nondisplaced oblique fracture of the distal fibula is again demonstrated. No displacement or angulation. Interval fiberglass cast.  IMPRESSION: Anatomic position and alignment of the previously demonstrated distal fibular fracture with an interval cast.   Electronically Signed   By: Enrique Sack M.D.   On: 02/05/2014 08:55   Dg Foot 2 Views Right  02/05/2014   CLINICAL DATA:  Followup right first MTP fracture/dislocation.  EXAM: RIGHT FOOT - 2 VIEW  COMPARISON:  02/03/2014.  FINDINGS: Interval fiberglass cast extending to the level of the proximal first phalanx. There is continued dorsal and proximal dislocation of the first proximal phalanx relative to the first metatarsal with an associated mildly comminuted fracture of the first metatarsal head and displacement of two of the first metatarsal head fragments in conjunction with the proximal phalanx.  IMPRESSION: Stable fracture/dislocation at the first MTP joint, as described above   Electronically Signed   By: Enrique Sack M.D.   On: 02/05/2014 08:58   Dg Foot 2 Views Right  02/03/2014   CLINICAL DATA:  Postreduction.  EXAM: RIGHT FOOT - 2 VIEW  COMPARISON:  Right foot radiograph February 03, 2014 at 1018 hr.  FINDINGS: Suspected residual subluxation without frank dislocation of first metatarsophalangeal fracture dislocation seen on prior radiograph. Dorsal foot soft tissue swelling without subcutaneous gas radiopaque foreign bodies. Moderate vascular calcifications.  IMPRESSION: Improved alignment of first metatarsophalangeal fracture dislocation.   Electronically Signed   By: Elon Alas   On: 02/03/2014 12:33   Dg Foot Complete Right  02/03/2014   CLINICAL DATA:  Fall  EXAM: RIGHT FOOT COMPLETE - 3+ VIEW  COMPARISON:  None.  FINDINGS: Dorsal dislocation of the first metatarsophalangeal joint. Possible small avulsion fracture.  No other fracture.  Diffuse arterial calcification.  IMPRESSION: Dislocation of the first MTP   Electronically Signed   By: Franchot Gallo M.D.   On: 02/03/2014 10:57   Dg C-arm 61-120 Min-no Report  02/04/2014   CLINICAL DATA: intraop   C-ARM 61-120 MINUTES  Fluoroscopy was utilized by the requesting physician.  No radiographic  interpretation.     CBC  Recent Labs Lab 02/03/14 0913 02/04/14 0553 02/05/14 0512  WBC 14.2* 11.9* 14.7*  HGB 14.2 13.2 12.9  HCT 44.0 42.7 41.4  PLT 195 189 159  MCV 81.3 83.7 84.1  MCH 26.2 25.9* 26.2  MCHC 32.3 30.9 31.2  RDW 15.7* 16.0* 16.3*  LYMPHSABS 1.8  --   --   MONOABS 0.7  --   --   EOSABS 0.5  --   --   BASOSABS 0.1  --   --     Chemistries   Recent Labs Lab 02/03/14 0913 02/04/14 0553 02/05/14 0512  NA 144 142 144  K 2.9* 4.0 4.1  CL 99 100 99  CO2 33* 34* 27  GLUCOSE 114* 195* 253*  BUN 15 13 13   CREATININE 0.71 0.73 0.65  CALCIUM 9.7 8.8 9.0   ------------------------------------------------------------------------------------------------------------------ estimated creatinine clearance is 71.8 ml/min (by C-G formula based on Cr of 0.65). ------------------------------------------------------------------------------------------------------------------  Recent Labs  02/03/14 0913  HGBA1C 11.8*   ------------------------------------------------------------------------------------------------------------------ No results found for this basename: CHOL, HDL, LDLCALC, TRIG, CHOLHDL, LDLDIRECT,  in the last 72 hours ------------------------------------------------------------------------------------------------------------------ No results found for this basename: TSH, T4TOTAL, FREET3, T3FREE, THYROIDAB,  in the last 72 hours ------------------------------------------------------------------------------------------------------------------  No results found for this basename: VITAMINB12, FOLATE, FERRITIN, TIBC, IRON, RETICCTPCT,  in the last 72 hours  Coagulation profile  Recent Labs Lab 02/03/14 0913 02/04/14 1942  INR 1.03 1.08    No results  found for this basename: DDIMER,  in the last 72 hours  Cardiac Enzymes  Recent Labs Lab 02/03/14 0913  TROPONINI <0.30   ------------------------------------------------------------------------------------------------------------------ No components found with this basename: POCBNP,      Time Spent in minutes  35   Kennidy Lamke K M.D on 02/05/2014 at 9:36 AM  Between 7am to 7pm - Pager - [phone]  After 7pm go to [email or web address] - password [email or web address]  And look for the night coverage person covering for me after hours  [facility] Office  [phone]   **Disclaimer: This note may have been dictated with voice recognition software. Similar sounding words can inadvertently be transcribed and this note may contain transcription errors which may not have been corrected upon publication of note.**

## 2014-02-05 NOTE — Progress Notes (Signed)
Patient throwing items at NT and refusing to let NT take temperature, attempting to rip IV out, using profane language towards nursing staff, and ripping off her hospital gown. RN text paged Hospitalists. Dr. Rogue Bussing called back and ordered one time dose of Ativan 0.5 mg to be administered in addition to placing safety mitts on the patient.

## 2014-02-05 NOTE — Anesthesia Postprocedure Evaluation (Signed)
  Anesthesia Post-op Note  Patient: [name]  Procedure(s) Performed: Procedure(s): INTRAMEDULLARY (IM) NAIL INTERTROCHANTRIC FEMORAL (Left)  Patient Location: PACU  Anesthesia Type:GA combined with regional for post-op pain  Level of Consciousness: awake, alert but mental status is confused as before her operation.  Airway and Oxygen Therapy: Patient Spontanous Breathing  Post-op Pain: none  Post-op Assessment: Post-op Vital signs reviewed  Post-op Vital Signs: Reviewed  Last Vitals:  Filed Vitals:   02/05/14 0100  BP:   Pulse: 103  Temp:   Resp: 20    Complications: No apparent anesthesia complications

## 2014-02-05 NOTE — Progress Notes (Signed)
Patient became incoherent and agitated approximately 2.5 hours after the administration of the one time ordered dose of Ativan 0.5 mg. RN heard the bed alarm go off and walked down the hall to see the patient hanging off the right side of the bed, IV pole leaning against the wall about to fall, aquacel dressings removed with the surgical incision open to air and the IV catheter completely in tact and removed by the patient. 2 RN's and 1 NT attempted to readjust the patient in the bed. The patient stated that she did not want to be adjusted in the bed correctly and proceeded to become violent at the nursing staff as they attempted to properly adjust her in the bed. The daughter (a Administrator) stated that it was fine for her mother to stay on the edge of the bed. The RN explained to the daughter that the positioning of her mother posed a significant hazard to her safety. The daughter eventually conceded and coaxed her mother into allowing the nursing staff to reposition her properly in the bed. At this instance the RN that was helping the patients RN and NT left the room. The daughter asked the RN "How long does the Ativan last because I need to get some sleep." The she said "I mean I know this is not all about me but I need to get some sleep so she needs to get something else and be placed in restraints." The RN explained to the daughter that the Ativan was a one time dose and that she would contact the MD for further guidance on the current situation. The RN text paged the Hospitalists and Dr. Rogue Bussing called back. The RN proceeded to tell the MD that the patient was agitated once again and that the daughter was requesting that her mother receive another dose of Ativan in addition to being placed in restraints. The RN relayed the message that the daughter stated that the patient was completely alert and oriented times four pre-operatively and that she was concerned. The NT told the daughter while the RN  was out of the patients room that some patients have side effect episodes of confusion post-operatively. The daughter questioned the RN once she returned to the room about the suggestion the NT made as related to the anesthesia and the RN informed the daughter that some patients do metabolize anesthesia differently but that she was unsure as to if the anesthesia actually caused this reaction from the patient or if the reaction was related to something else.  The MD ordered that the patient be placed in soft wrist restraints and a soft waist restraint for a maximum of 24 hours. The MD informed the RN that he would not re- order another dose of Ativan again until further neurological assessments were performed on the patient to rule out any significant reasons as to why the patients mental status was altered significantly post operatively. The RN went back into the patients room and relayed the updates to the daughter of no additional Ativan and soft wrist restraints and a soft waist restraint. The daughter stated "I do not understand why the doctor would order something for my mom without coming to physically see her." She then stated "Don't they have doctors that are on call around here? He needs to come up here and see her before her starts prescribing something and he does not really know what is wrong with her" The RN informed the patient that while there are doctors that  are on call they typically do not come onto the floor at that hour unless there is a significant emergent situtation with the patient. The patient asked why the doctor was not going to order Ativan for the patient and the RN informed the daughter that the MD wanted to further assess the patients mental status as may be related to dementia before her ordered another dose. The patient then asked "Well when do the doctors come up here to make their rounds?" The RN explained to the daughter that the doctors make their daily rounds and that she would  be unable to give her a specific time when to expect the doctor but that he would be around some time during the day. The daughter then stated "Oh so I am just supposed to deal with this by myself all day until he gets here." The RN stated "No mam we are here to help you." At this moment the charge RN came to help the patients RN prepare to apply the restraints. The patients daughter began to fluster and called the patients husband and tell him that restraints were being placed on her mother. The daughter asked the RN "Can the restraints be placed 2-3 hours maximum so that I can get some rest." The RN told the daughter that she would notify the MD know at the time that she wanted the restraints removed from her mother. The daughter asked the RN what her thoughts were about the restraints because she felt that her mom may be able to rest without the restraints being on her afterall. The RN explained to the daughter that the placement of the restraints were her call and that she simply needed a direct yes or no to proceed. The daughter said yes to the wrist restraints so that she (the daughter) could get some sleep and no to the waist restraint at the moment. The charge RN began to explain to the daughter what soft wrist restraints were and the daughter said "I know what they are I am an attorney go ahead and do what you need to do." Before the placement of the restraints the RN asked the daughter for clarification as to wether she did or did not want the restraints placed on her mother once again because the daughter seemed hesitant. The daughter conceded. The charge RN placed the restraints on the patient while the patients RN was a witness to the occurrence.

## 2014-02-05 NOTE — Op Note (Signed)
NAME:  [name], [name]                 ACCOUNT NO.:  [id number]  MEDICAL RECORD NO.:  [id number]  LOCATION:  [id number]                        FACILITY:  [facility]  PHYSICIAN:  Pietro Cassis. Alvan Dame, M.D.  DATE OF BIRTH:  January 29, 1941  DATE OF PROCEDURE:  02/04/2014 DATE OF DISCHARGE:                              OPERATIVE REPORT   PREOPERATIVE DIAGNOSIS:  Left displaced transverse subtrochanteric femur fracture.  POSTOPERATIVE DIAGNOSIS:  Left displaced transverse subtrochanteric femur fracture.  PROCEDURE:  Open reduction and internal fixation of the left subtrochanteric femur fracture utilizing a Biomet VersaNail 9 x 340 mm with a crisscross locking pattern proximally in the single __________ static lock distally.  SURGEON:  Pietro Cassis. Alvan Dame, M.D.  ASSISTANT:  [name], PAC.  Note, Mr. [name] was present for the entirety of the case from preoperative positioning.  Perioperative management of the operative extremity during the case may help in assistance of maintenance with maintenance in reduction and instrumention of the femur and primary wound closure.  ANESTHESIA:  General.  SPECIMENS:  None.  COMPLICATION:  None.  BLOOD LOSS:  Less than 100 mL.  SPECIMENS:  None.  INDICATIONS FOR PROCEDURE:  [name] is a 73 year old female who presented to [facility] yesterday after a fall.  She sustained injuries involving the left proximal femur as well as the right foot and ankle.  She was deemed to be too high risk of a surgical patient at [facility] and transferred down to [facility] for definitive management. She was seen and evaluated in the hospital by the hospitalist upon admission as well as cardiology with arrangements made in the postoperative period and cleared for surgery.  Risks and benefits, necessity of fracture fixation of left proximal femur were discussed and reviewed.  Consent was obtained for fracture management.  We did discuss specifically the complicating  features of a subtrochanteric femur fracture especially one that is so transverse and the difficulties with maintenance reduction, the potential complications in the postop care including nonunion, need for future surgeries,__________ and gait abnormalities.  Risks of infection, DVT, also reviewed.  Consent was obtained for benefit of fracture care and management.  PROCEDURE IN DETAIL:  The patient was brought to operative theater. Once adequate anesthesia, preoperative antibiotics, 2 g of Ancef administered.  She was positioned supine.  Her right leg was flexed and abducted all the way with the peroneal post placed.  Her left foot was placed in a traction boot.  With traction and slight external rotation, fluoroscopy was used to identify and evaluate the fracture site.  There is significant displacement on the lateral radiograph view with anterior flexion of the proximal piece and posterior displacement of the shaft of the femur.  However, we had enough length restored for initiating the procedure.  Left hip was then prepped and draped in a sterile fashion using non iodine based technique with split drapes and clear drape.  Once this was done, a time-out was performed identifying the patient, planned procedure, and extremity.  Fluoroscopy was brought back to the field with the fluoro drape in place, and landmarks were identified.  An incision was then made proximal tip of the trochanter.  Soft  tissue dissection was carried down to the gluteal fascia  The tip of the trochanter was identified directly and as well as under fluoroscopy and guidewire tapped into it.  On the lateral view, it appeared to be __________slightly anteriorly which I felt was good for maintenance in reduction.  Avoiding complications posteriorly.  With no guidewire in place, I drilled open the proximal femur and then used reduction to orient the 3 pieces.  At this point, recognizing the significant deforming  factors of the muscles involved with this fracture pattern, I elected at this point to make an incision laterally at the level of the fracture site, allowing me to help to further reduce the fracture to more anatomic position in the AP and lateral views radiographically.  I ended up using a Verbrugge clamp in order to keep the proximal fragment reduced to this, the shaft of the femur.  AP and lateral planes, there is only minimal displacement recommended, which I felt was excellent.  At this point, a ball-tip guidewire was passed down to the end of the knee.  We measured the depth of the nail, selected 340 mm nail and with the ball-tip guidewire in place, I began reaming with 9 mm reamer and reamed [place] to a 11 mm remear and __________femur.  The 9 mm nail was selected.  The nail was then passed by hand to its what I felt to be the appropriate depth and then placed under fluoroscopic guidance using a combination of guidewires may __________certain to have correct orientation crisscross pattern of screws from greater to lesser trochanter then from the lateral aspect of the proximal femur into the femoral neck and head.  With the clamp in place still, I placed a distal interlock under fluoroscopic perfect circle technique.  At this point, the clamp was removed off the proximal femur and final radiographs were obtained showing what appeared to be near-anatomic reduction in maintenance thereof with this fixation pattern in place.  We irrigated all the wounds.  The proximal wound was closed in layers with #1 Vicryl in the gluteal fascia.  __________ advancement from the proximal and distal respectively.  The remainder of her wounds were closed with 2-0 Vicryl and running 4-0 Monocryl with Dermabond and then dressing.  She was then brought to the recovery room in stable condition.  She will be partial weightbearing this left lower extremity, but with associated right lower extremity injuries  this would pose a significant challenge to her.  We will work on this postoperative period and discuss with the family.     Pietro Cassis Alvan Dame, M.D.     MDO/MEDQ  D:  02/04/2014  T:  02/05/2014  Job:  [id number]

## 2014-02-05 NOTE — Progress Notes (Signed)
Pt still up in the chair at this time. The Md requested that the Pt stay up in the chair until Lunch time. Pts neuro status has begun to clear slightly. At times she remembers she has a broken leg. She continues to repeat the same questions again and again. She is much pleasant at this time. She took my hand, told me she loves me and thanked me for my help. The daughter has left but the husband is still at he bedside. Will continue to monitor Pt.

## 2014-02-05 NOTE — Progress Notes (Signed)
Patient transferred from [facility] Clinical Education officer, museum (CSW) at [employer] worked up for rehab in [place]. CSW met with patient and her daughter [name] at bedside and gave SNF offers. [facility] in [place] has offered a bed. Daughter reported that [facility] is their first choice. [facility] has not responded in carefinder. CSW also spoke with patient's husband Itzy Adler via phone. Husband reported that [facility] is the first choice and [facility] is the second choice. CSW will continue to follow and assist as needed.   Blima Rich, LCSWA Weekend CSW [phone];l

## 2014-02-05 NOTE — Progress Notes (Signed)
Patients daughter requests that doctor addresses patients grinding teeth. Patients husband states that teeth grinding is the patients baseline.

## 2014-02-05 NOTE — Progress Notes (Signed)
Patient had soft wrist restraints placed at 0445.

## 2014-02-05 NOTE — Progress Notes (Signed)
   Subjective: 1 Day Post-Op Procedure(s) (LRB): INTRAMEDULLARY (IM) NAIL INTERTROCHANTRIC FEMORAL (Left)   Patient reports pain as moderate, keeps asking why we hurt her. She appears to confused this morning, medicine is already working on it.  Objective:   VITALS:   Filed Vitals:   02/05/14 0626  BP: 111/43  Pulse: 100  Temp: 98.2 F (36.8 C)  Resp: 18    Dorsiflexion/Plantar flexion intact Incision: dressing C/D/I No cellulitis present Compartment soft  LABS  Recent Labs  02/03/14 0913 02/04/14 0553 02/05/14 0512  HGB 14.2 13.2 12.9  HCT 44.0 42.7 41.4  WBC 14.2* 11.9* 14.7*  PLT 195 189 159     Recent Labs  02/03/14 0913 02/04/14 0553 02/05/14 0512  NA 144 142 144  K 2.9* 4.0 4.1  BUN 15 13 13   CREATININE 0.71 0.73 0.65  GLUCOSE 114* 195* 253*     Assessment/Plan: 1 Day Post-Op Procedure(s) (LRB): INTRAMEDULLARY (IM) NAIL INTERTROCHANTRIC FEMORAL (Left) Hospitalist knows about confusion and states they are working on it. Advance diet Up with therapy, as tolerated Discharge home with home health / SNF on d/c   Oblique nondisplaced fracture of the right distal fibula  Right first metatarsophalangeal fracture Dr. Alvan Dame will consult Dr. Doran Durand for further evaluation       West Pugh. Zahira Brummond   PAC  02/05/2014, 8:52 AM

## 2014-02-05 NOTE — Progress Notes (Signed)
Entered the room to admin Pt insulin. Pt still remains pleasant and is clear enough to remember she takes an insulin at home but she takes it at night. Pts daughter states that Pt did not eat much of her lunch, so I offered to go get the Pt something she thought sounded like she could eat it. Pt insisted that she was not hungry  and declined. I offered to get her an Ensure or to make her a milkshake if she would like one. The daughter then snapped at me saying didn't I know anything and that the pt was lactose intolerant. I told her that I was unaware of that fact and it was not listed in the chart but I could fix that. I then told them to call me if they needed anything. I asked the daughter if she had my number to which she replied, well it is right there and pointed to the board. I told her to fell free to use it whenever she needed me, then exited the room. I will continue to monitor the Pt.

## 2014-02-05 NOTE — Progress Notes (Signed)
Patient returned from PACU. Patients 2200 medications were not given because the patient was not on the floor. Hospitalists notified and Dr. Rogue Bussing returned the call and stated to hold all 2200 meds but to administer the 30 units of Levemir.

## 2014-02-05 NOTE — Progress Notes (Signed)
Pt is oriented x3. She was complaining about needing to have a bowel movement and was disimpacted after she was unable to have a bm. Stool was soft and brown. Pt was quite relieved and frequently expressed her gratitude. Her pain level has been 4 to 5. No restraints have been used since pt is cooperative. Her daughter is staying in the room with her and has also been cooperative and appreciative.

## 2014-02-05 NOTE — Progress Notes (Signed)
Ref: PROVIDER NOT IN SYSTEM-Dr. Prasant Singh   Subjective:  Confused. S/P Left hip surgery. Afebrile.  Objective:  Vital Signs in the last 24 hours: Temp:  [97 F (36.1 C)-98.4 F (36.9 C)] 98.2 F (36.8 C) (07/04 0626) Pulse Rate:  [80-110] 100 (07/04 0626) Cardiac Rhythm:  [-] Sinus tachycardia (07/04 0143) Resp:  [18-28] 18 (07/04 0626) BP: (94-164)/(43-76) 111/43 mmHg (07/04 0626) SpO2:  [88 %-99 %] 96 % (07/04 0138) Weight:  [90.719 kg (200 lb)] 90.719 kg (200 lb) (07/03 1550)  Physical Exam: BP Readings from Last 1 Encounters:  02/05/14 111/43     Wt Readings from Last 1 Encounters:  02/04/14 90.719 kg (200 lb)    Weight change: 0 kg (0 lb)  HEENT: [place]/AT, Eyes-Brown, [name], EOMI, Conjunctiva-Pink, Sclera-Non-icteric Neck: No JVD, No bruit, Trachea midline. Lungs:  Clear, Bilateral. Cardiac:  Regular rhythm, normal S1 and S2, no S3. III/VI systolic murmur. Abdomen:  Soft, non-tender. Extremities:  No edema present. No cyanosis. No clubbing. CNS: AxOx1, Cranial nerves grossly intact, Right lower leg in castoves all 4 extremities. Right handed. Left hip surgical scar. Skin: Warm and dry.   Intake/Output from previous day: 07/03 0701 - 07/04 0700 In: 1700 [I.V.:1700] Out: 725 [Urine:575; Blood:150]    Lab Results: BMET    Component Value Date/Time   NA 144 02/05/2014 0512   NA 142 02/04/2014 0553   NA 144 02/03/2014 0913   K 4.1 02/05/2014 0512   K 4.0 02/04/2014 0553   K 2.9* 02/03/2014 0913   CL 99 02/05/2014 0512   CL 100 02/04/2014 0553   CL 99 02/03/2014 0913   CO2 27 02/05/2014 0512   CO2 34* 02/04/2014 0553   CO2 33* 02/03/2014 0913   GLUCOSE 253* 02/05/2014 0512   GLUCOSE 195* 02/04/2014 0553   GLUCOSE 114* 02/03/2014 0913   BUN 13 02/05/2014 0512   BUN 13 02/04/2014 0553   BUN 15 02/03/2014 0913   CREATININE 0.65 02/05/2014 0512   CREATININE 0.73 02/04/2014 0553   CREATININE 0.71 02/03/2014 0913   CALCIUM 9.0 02/05/2014 0512   CALCIUM 8.8 02/04/2014 0553   CALCIUM 9.7 02/03/2014 0913    GFRNONAA 87* 02/05/2014 0512   GFRNONAA 84* 02/04/2014 0553   GFRNONAA 85* 02/03/2014 0913   GFRAA >90 02/05/2014 0512   GFRAA >90 02/04/2014 0553   GFRAA >90 02/03/2014 0913   CBC    Component Value Date/Time   WBC 14.7* 02/05/2014 0512   RBC 4.92 02/05/2014 0512   HGB 12.9 02/05/2014 0512   HCT 41.4 02/05/2014 0512   PLT 159 02/05/2014 0512   MCV 84.1 02/05/2014 0512   MCH 26.2 02/05/2014 0512   MCHC 31.2 02/05/2014 0512   RDW 16.3* 02/05/2014 0512   LYMPHSABS 1.8 02/03/2014 0913   MONOABS 0.7 02/03/2014 0913   EOSABS 0.5 02/03/2014 0913   BASOSABS 0.1 02/03/2014 0913   HEPATIC Function Panel No results found for this basename: PROT,  ALBUMIN,  AST,  ALT,  ALKPHOS,  BILIDIR,  IBILI,  in the last 8760 hours HEMOGLOBIN A1C No components found with this basename: HGA1C,  MPG   CARDIAC ENZYMES Lab Results  Component Value Date   TROPONINI <0.30 02/03/2014   BNP No results found for this basename: PROBNP,  in the last 8760 hours TSH No results found for this basename: TSH,  in the last 8760 hours CHOLESTEROL No results found for this basename: CHOL,  in the last 8760 hours  Scheduled Meds: . amitriptyline  10 mg Oral QHS  . amLODipine  5 mg Oral Daily  . atorvastatin  20 mg Oral QHS  . docusate sodium  100 mg Oral BID  . [START ON 02/06/2014] enoxaparin (LOVENOX) injection  40 mg Subcutaneous Q24H  . fluticasone  1 spray Each Nare Daily  . gabapentin  100 mg Oral BID  . [START ON 02/06/2014] hydrochlorothiazide  25 mg Oral Daily  . hydrocortisone  1 application Topical QID  . insulin aspart  0-15 Units Subcutaneous TID WC  . insulin detemir  30 Units Subcutaneous BID  . pantoprazole  40 mg Oral Daily   Continuous Infusions:  PRN Meds:.acetaminophen, alum & mag hydroxide-simeth, haloperidol lactate, hydrALAZINE, HYDROmorphone (DILAUDID) injection, ondansetron (ZOFRAN) IV, oxyCODONE, zolpidem  Assessment/Plan:  Moderate aortic stenosis  Abnormal EKG  LV hypertrophy with repolarization changes or  inferior ischemia.  Acute displaced left hip (Femur) subtrochanteric fracture  Acute right first metatarsophalangeal fracture dislocation.  Acute nondisplaced fracture of the distal right fibula  DM, II, uncontrolled  Hypertension  Morbid obesity  Hypokalemia-Corrected Confusion  Continue medical treatment.    LOS: 2 days    Dixie Dials  MD  02/05/2014, 11:54 AM

## 2014-02-05 NOTE — Progress Notes (Signed)
Called to the room to put the Pt back to bed. I called a huddle to the room and the staff lifted the Pt back to the bed. Pt remains pleasant and is not bothering the IV or Foley. Restraints left off at this time. Pt resting calmly in the bed. Daughter and husband at the bedside.  Will continue to monitor Pts status and safety.

## 2014-02-05 NOTE — Progress Notes (Signed)
Lab technician asked the RN if it was ok to draw the patients blood for labs. The RN went into the patients room to ask the daughter whether she wanted her mothers labs drawn for blood. The daughter requested a later time slot so that her mother could sleep. The RN asked the lab technician to call the lab and see if there was a later available time. After calling the lab tech notified the RN that this was the only time that the patients labs could be drawn. The daughter then agreed that her mothers labs could be drawn. The lab tech proceeded to go and find another lab tech to draw the blood. The daughter came into the hallway to ask the RN "Why did he go find someone else to draw the blood? He doesn't want to do it? Its time for him to leave?" The RN stated that she had no knowledge of why the lab tech went to find another lab tech to draw the blood. The daughter stated "I have no problem asking him why he did that." Upon the lab tech coming back to the room he explained that the lab works in teams and that he was on his way to 4N and that another lab tech was on her way to take over the rest of the patients on 5N. The lab tech and RN went into the patients room. The lab tech was successfully able to draw 2 tubes of blood for lab.

## 2014-02-05 NOTE — Progress Notes (Addendum)
Wrist restraints removed from Pt at 0810 per Dr Keturah Barre request. Pt extremely agitated and annoyed at staff for even speaking to her. She would not answer questions when asked and cursed at staff and family while attempting to get Pt to the chair. Pt was lifting to the chair after multiple attempts to get her to stand. Pt continued to try to hit staff, yelled at [place] and family. Pt was left up in the chair with the call bell within reach and her daughter and husband at the bedside.

## 2014-02-05 NOTE — Progress Notes (Signed)
RN asked the daughter if she would like the patients restraints removed. The daughter stated to leave the patients restraints on until further notice. RN will continue to assess.

## 2014-02-06 LAB — BASIC METABOLIC PANEL
Anion gap: 11 (ref 5–15)
BUN: 13 mg/dL (ref 6–23)
CHLORIDE: 96 meq/L (ref 96–112)
CO2: 32 mEq/L (ref 19–32)
Calcium: 8.7 mg/dL (ref 8.4–10.5)
Creatinine, Ser: 0.74 mg/dL (ref 0.50–1.10)
GFR calc non Af Amer: 84 mL/min — ABNORMAL LOW (ref >90)
Glucose, Bld: 263 mg/dL — ABNORMAL HIGH (ref 70–99)
POTASSIUM: 3.6 meq/L — AB (ref 3.7–5.3)
Sodium: 139 mEq/L (ref 137–147)

## 2014-02-06 LAB — CBC
HCT: 36.4 % (ref 36.0–46.0)
Hemoglobin: 11.5 g/dL — ABNORMAL LOW (ref 12.0–15.0)
MCH: 26.2 pg (ref 26.0–34.0)
MCHC: 31.6 g/dL (ref 30.0–36.0)
MCV: 82.9 fL (ref 78.0–100.0)
PLATELETS: 180 10*3/uL (ref 150–400)
RBC: 4.39 MIL/uL (ref 3.87–5.11)
RDW: 16.4 % — ABNORMAL HIGH (ref 11.5–15.5)
WBC: 12.5 10*3/uL — AB (ref 4.0–10.5)

## 2014-02-06 LAB — GLUCOSE, CAPILLARY
GLUCOSE-CAPILLARY: 280 mg/dL — AB (ref 70–99)
Glucose-Capillary: 246 mg/dL — ABNORMAL HIGH (ref 70–99)
Glucose-Capillary: 263 mg/dL — ABNORMAL HIGH (ref 70–99)
Glucose-Capillary: 197 mg/dL — ABNORMAL HIGH (ref 70–99)

## 2014-02-06 MED ORDER — LEVOFLOXACIN 500 MG PO TABS
500.0000 mg | ORAL_TABLET | Freq: Every day | ORAL | Status: AC
  Administered 2014-02-06 – 2014-02-08 (×3): 500 mg via ORAL
  Filled 2014-02-06 (×3): qty 1

## 2014-02-06 MED ORDER — POTASSIUM CHLORIDE CRYS ER 20 MEQ PO TBCR
40.0000 meq | EXTENDED_RELEASE_TABLET | Freq: Once | ORAL | Status: AC
  Administered 2014-02-06: 40 meq via ORAL
  Filled 2014-02-06: qty 2

## 2014-02-06 MED ORDER — INSULIN DETEMIR 100 UNIT/ML ~~LOC~~ SOLN
40.0000 [IU] | Freq: Two times a day (BID) | SUBCUTANEOUS | Status: DC
  Administered 2014-02-06 – 2014-02-07 (×3): 40 [IU] via SUBCUTANEOUS
  Filled 2014-02-06 (×7): qty 0.4

## 2014-02-06 MED ORDER — CARVEDILOL 3.125 MG PO TABS
3.1250 mg | ORAL_TABLET | Freq: Two times a day (BID) | ORAL | Status: DC
  Administered 2014-02-06 – 2014-02-10 (×8): 3.125 mg via ORAL
  Filled 2014-02-06 (×10): qty 1

## 2014-02-06 NOTE — Progress Notes (Signed)
Patient ID: [name], female   DOB: Dec 18, 1940, 73 y.o.   MRN: [id number] Subjective: 2 Days Post-Op Procedure(s) (LRB): INTRAMEDULLARY (IM) NAIL INTERTROCHANTRIC FEMORAL (Left) for subtrochanteric femur fracture  Right fibula fracture Right Hallux IP dislocation    Patient reports pain as moderate. More awake alert today, son in room Complains of left hip pain and right toe pain   Objective:   VITALS:   Filed Vitals:   02/06/14 0537  BP: 144/62  Pulse: 102  Temp: 98.9 F (37.2 C)  Resp: 16    Neurovascular intact Incision: dressing C/D/I left hip  Right great toe shortening, minor deformity, no tinting of skin  LABS  Recent Labs  02/04/14 0553 02/05/14 0512 02/06/14 0405  HGB 13.2 12.9 11.5*  HCT 42.7 41.4 36.4  WBC 11.9* 14.7* 12.5*  PLT 189 159 180     Recent Labs  02/04/14 0553 02/05/14 0512 02/06/14 0405  NA 142 144 139  K 4.0 4.1 3.6*  BUN 13 13 13   CREATININE 0.73 0.65 0.74  GLUCOSE 195* 253* 263*     Recent Labs  02/04/14 1942  INR 1.08     Assessment/Plan: 2 Days Post-Op Procedure(s) (LRB): INTRAMEDULLARY (IM) NAIL INTERTROCHANTRIC FEMORAL (Left)   Advance diet Up with therapy  Left LE partial weight bearing  I have reviewed her right toe injury with Dr. Doran Durand.  He feels that it will require at least the possibility of an open reduction as often times soft tissue can get interposed with in the joint making closed reduction not possible.  He plans to round on her tomorrow and get surgery set for Tuesday am.  I will await his recommendations for weight bearing on this right LE.  I would imagine that she would be able to be D/C'd to [facility] Wednesday or Thursday at latest  Left LE exercises permissible and recommended for strength and general mobility concerns

## 2014-02-06 NOTE — Progress Notes (Signed)
Ref: PROVIDER NOT IN SYSTEM   Subjective:  Less confused. S/P left hip surgery. T max 99.9  Objective:  Vital Signs in the last 24 hours: Temp:  [98.4 F (36.9 C)-99.9 F (37.7 C)] 98.9 F (37.2 C) (07/05 0537) Pulse Rate:  [102-115] 102 (07/05 0537) Cardiac Rhythm:  [-]  Resp:  [16-18] 16 (07/05 0537) BP: (131-179)/(57-62) 144/62 mmHg (07/05 0537) SpO2:  [93 %-96 %] 93 % (07/05 0537)  Physical Exam: BP Readings from Last 1 Encounters:  02/06/14 144/62     Wt Readings from Last 1 Encounters:  02/04/14 90.719 kg (200 lb)    Weight change:   HEENT: [place]/AT, Eyes-Brown, [name], EOMI, Conjunctiva-Pink, Sclera-Non-icteric Neck: No JVD, No bruit, Trachea midline. Lungs:  Clear, Bilateral. Cardiac:  Regular rhythm, normal S1 and S2, no S3. III/VI systolic murmur. Abdomen:  Soft, non-tender. Extremities:  No edema present. No cyanosis. No clubbing. Right lower leg and foot in cast and left hip surgical scar CNS: AxOx2, Cranial nerves grossly intact. Right handed. Skin: Warm and dry.   Intake/Output from previous day: 07/04 0701 - 07/05 0700 In: 1050 [P.O.:1050] Out: 2100 [Urine:2100]    Lab Results: BMET    Component Value Date/Time   NA 139 02/06/2014 0405   NA 144 02/05/2014 0512   NA 142 02/04/2014 0553   K 3.6* 02/06/2014 0405   K 4.1 02/05/2014 0512   K 4.0 02/04/2014 0553   CL 96 02/06/2014 0405   CL 99 02/05/2014 0512   CL 100 02/04/2014 0553   CO2 32 02/06/2014 0405   CO2 27 02/05/2014 0512   CO2 34* 02/04/2014 0553   GLUCOSE 263* 02/06/2014 0405   GLUCOSE 253* 02/05/2014 0512   GLUCOSE 195* 02/04/2014 0553   BUN 13 02/06/2014 0405   BUN 13 02/05/2014 0512   BUN 13 02/04/2014 0553   CREATININE 0.74 02/06/2014 0405   CREATININE 0.65 02/05/2014 0512   CREATININE 0.73 02/04/2014 0553   CALCIUM 8.7 02/06/2014 0405   CALCIUM 9.0 02/05/2014 0512   CALCIUM 8.8 02/04/2014 0553   GFRNONAA 84* 02/06/2014 0405   GFRNONAA 87* 02/05/2014 0512   GFRNONAA 84* 02/04/2014 0553   GFRAA >90 02/06/2014 0405   GFRAA >90  02/05/2014 0512   GFRAA >90 02/04/2014 0553   CBC    Component Value Date/Time   WBC 12.5* 02/06/2014 0405   RBC 4.39 02/06/2014 0405   HGB 11.5* 02/06/2014 0405   HCT 36.4 02/06/2014 0405   PLT 180 02/06/2014 0405   MCV 82.9 02/06/2014 0405   MCH 26.2 02/06/2014 0405   MCHC 31.6 02/06/2014 0405   RDW 16.4* 02/06/2014 0405   LYMPHSABS 1.8 02/03/2014 0913   MONOABS 0.7 02/03/2014 0913   EOSABS 0.5 02/03/2014 0913   BASOSABS 0.1 02/03/2014 0913   HEPATIC Function Panel No results found for this basename: PROT,  ALBUMIN,  AST,  ALT,  ALKPHOS,  BILIDIR,  IBILI,  in the last 8760 hours HEMOGLOBIN A1C No components found with this basename: HGA1C,  MPG   CARDIAC ENZYMES Lab Results  Component Value Date   TROPONINI <0.30 02/03/2014   BNP No results found for this basename: PROBNP,  in the last 8760 hours TSH No results found for this basename: TSH,  in the last 8760 hours CHOLESTEROL No results found for this basename: CHOL,  in the last 8760 hours  Scheduled Meds: . amitriptyline  10 mg Oral QHS  . amLODipine  5 mg Oral Daily  . atorvastatin  20 mg  Oral QHS  . carvedilol  3.125 mg Oral BID WC  . docusate sodium  100 mg Oral BID  . enoxaparin (LOVENOX) injection  40 mg Subcutaneous Q24H  . fluticasone  1 spray Each Nare Daily  . gabapentin  100 mg Oral BID  . hydrochlorothiazide  25 mg Oral Daily  . hydrocortisone  1 application Topical QID  . insulin aspart  0-15 Units Subcutaneous TID WC  . insulin aspart  0-5 Units Subcutaneous QHS  . insulin detemir  40 Units Subcutaneous BID  . levofloxacin  500 mg Oral Daily  . pantoprazole  40 mg Oral Daily   Continuous Infusions:  PRN Meds:.acetaminophen, alum & mag hydroxide-simeth, haloperidol lactate, hydrALAZINE, HYDROmorphone (DILAUDID) injection, ondansetron (ZOFRAN) IV, oxyCODONE, zolpidem  Assessment/Plan: Moderate aortic stenosis  Abnormal EKG  LV hypertrophy with repolarization changes or inferior ischemia.  Acute displaced left hip (Femur)  subtrochanteric fracture  Acute right first metatarsophalangeal fracture dislocation.  Acute nondisplaced fracture of the distal right fibula  DM, II, uncontrolled  Hypertension  Morbid obesity  Hypokalemia-Corrected  Confusion- improving  Continue medical treatment. F/U in office in 3 months.    LOS: 3 days    Dixie Dials  MD  02/06/2014, 11:10 AM

## 2014-02-06 NOTE — Progress Notes (Addendum)
Patient Demographics  [name], is a 73 y.o. female, DOB - 1941/02/26, [id number]  Admit date - 02/03/2014   Admitting Physician Mauri Pole, MD  Outpatient Primary MD for the patient is PROVIDER NOT [place]  LOS - 3   Chief Complaint  Patient presents with  . Fall  . Hip Injury         Subjective:   [name] today has, No headache, No chest pain, No abdominal pain - No Nausea, No new weakness tingling or numbness, No Cough - SOB. Feels better less confused today.     Assessment & Plan    1. Mech fall with left intertrochanteric fracture, right distal fibula and first metatarsophalangeal joint fracture.   Seen by Dr. Alvan Dame underwent open reduction internal fixation of left intertrochanteric fracture. The right Leg on the ACE splint, current weight bearing status is no weight bearing on the right partial weightbearing on the left, with patient's weight and delirium or any activity is very high risk for fall. Will require placement, PT following, DVT prophylaxis Lovenox from 02/06/2014 per orthopedics.     2. Mild aortic stenosis noted on echogram done in regional with nonspecific EKG changes. Seen by cardiologist Dr. Doylene Canard, will place her on her home dose aspirin 81 mg from today, no acute issues. Chest pain-free we'll follow with Dr. Doylene Canard postdischarge.     3. Delirium. Minimize narcotics and benzodiazepines, QTC is stable, as needed Haldol. Fall and aspiration precautions. Family educated bedside. They are okay with restraints as needed. Delirium has improved.     4. Hypertension. Currently on Norvasc, add Coreg for better control. On when necessary hydralazine.    5. DM type II. Outpatient control poor, A1c greater than 11. We'll place her on split dose Levimir  increased to 40 units twice a day, continue sliding scale insulin.  CBG (last 3)   Recent Labs  02/05/14 1624 02/05/14 2128 02/06/14 0615  GLUCAP 224* 397* 246*       5. Dyslipidemia. Home dose statin continued.     6. Underlying chronic diastolic CHF with mild acute decompensation as evident on chest x-ray and physical exam. Resolved after Low-dose Lasix, place on home dose HCTZ, continue fluid and salt restriction, monitor clinically. EF is 60% with mild aortic stenosis on echogram.     7. Lower extremity edema. Due to mild acute on chronic diastolic CHF, diuresed with Lasix, now placed on home dose HCTZ, lower extremity venous duplex unremarkable.      8. Mild leukocytosis. due to UTI placed on Levaquin for 3 days pharmacy to dose.     9.GERD - On PPI     10. Diabetic neuropathy. On Neurontin at a lower than home dose due to confusion, stable.      11,Low K - replace and monitor.       Code Status: Full  Family Communication: Husband and daughter bedside 02/05/2014, Daughter 02/06/2014  Disposition Plan: Likely SNF    Procedures     ORIF L Hip 02-04-14 Dr Alvan Dame   Echo    Impressions:  - Moderate to severe LVH with LVEF 15-17%, grade 1 diastolic dysfunction. Severe left atrial enlargement. Moderate aortic stenosis with functionally bicuspid aortic valve as outlined  above. Unable to assess PASP    Consults  Ortho, Cards   Medications  Scheduled Meds: . amitriptyline  10 mg Oral QHS  . amLODipine  5 mg Oral Daily  . atorvastatin  20 mg Oral QHS  . docusate sodium  100 mg Oral BID  . enoxaparin (LOVENOX) injection  40 mg Subcutaneous Q24H  . fluticasone  1 spray Each Nare Daily  . gabapentin  100 mg Oral BID  . hydrochlorothiazide  25 mg Oral Daily  . hydrocortisone  1 application Topical QID  . insulin aspart  0-15 Units Subcutaneous TID WC  . insulin aspart  0-5 Units Subcutaneous QHS  . insulin detemir  40 Units Subcutaneous BID   . pantoprazole  40 mg Oral Daily  . potassium chloride  40 mEq Oral Once   Continuous Infusions:  PRN Meds:.acetaminophen, alum & mag hydroxide-simeth, haloperidol lactate, hydrALAZINE, HYDROmorphone (DILAUDID) injection, ondansetron (ZOFRAN) IV, oxyCODONE, zolpidem    DVT Prophylaxis  Lovenox   - SCDs   Lab Results  Component Value Date   PLT 180 02/06/2014      Antibiotics     Anti-infectives   Start     Dose/Rate Route Frequency Ordered Stop   02/04/14 2116  ceFAZolin (ANCEF) 2-3 GM-% IVPB SOLR    Comments:  Ubaldo Glassing   : cabinet override      02/04/14 2116 02/05/14 0929   02/04/14 0600  vancomycin (VANCOCIN) IVPB 1000 mg/200 mL premix  Status:  Discontinued     1,000 mg 200 mL/hr over 60 Minutes Intravenous On call to O.R. 02/03/14 1751 02/04/14 2304          Objective:   Filed Vitals:   02/05/14 1541 02/05/14 2046 02/06/14 0120 02/06/14 0537  BP: 131/62 179/57 160/59 144/62  Pulse: 113 115 103 102  Temp: 99.9 F (37.7 C) 99.3 F (37.4 C) 98.4 F (36.9 C) 98.9 F (37.2 C)  TempSrc: Oral     Resp: 18 18 16 16   Height:      Weight:      SpO2: 96% 95% 96% 93%    Wt Readings from Last 3 Encounters:  02/04/14 90.719 kg (200 lb)  02/04/14 90.719 kg (200 lb)  02/04/14 90.719 kg (200 lb)     Intake/Output Summary (Last 24 hours) at 02/06/14 0920 Last data filed at 02/06/14 0700  Gross per 24 hour  Intake    810 ml  Output   2100 ml  Net  -1290 ml     Physical Exam  Awake but pleasantly confused, No new F.N deficits, Normal affect [place].AT,PERRAL Supple Neck,No JVD, No cervical lymphadenopathy appriciated.  Symmetrical Chest wall movement, Good air movement bilaterally, CTAB RRR,No Gallops,Rubs or new Murmurs, No Parasternal Heave +ve B.Sounds, Abd Soft, No tenderness, No organomegaly appriciated, No rebound - guarding or rigidity. No Cyanosis, Clubbing , No new Rash or bruise, L.Leg in ACE wrap, trace bipedal edema     Data Review    Micro Results Recent Results (from the past 240 hour(s))  MRSA PCR SCREENING     Status: None   Collection Time    02/03/14  7:03 PM      Result Value Ref Range Status   MRSA by PCR NEGATIVE  NEGATIVE Final   Comment:            The GeneXpert MRSA Assay (FDA     approved for NASAL specimens     only), is one component of a  comprehensive MRSA colonization     surveillance program. It is not     intended to diagnose MRSA     infection nor to guide or     monitor treatment for     MRSA infections.    Radiology Reports Dg Chest 1 View  02/03/2014   CLINICAL DATA:  Fall.  Hip injury  EXAM: CHEST - 1 VIEW  COMPARISON:  None.  FINDINGS: Cardiac enlargement without heart failure. Lungs are clear. No displaced rib fracture.  IMPRESSION: Cardiac enlargement.  No active cardiopulmonary disease.   Electronically Signed   By: Franchot Gallo M.D.   On: 02/03/2014 10:54   Dg Hip Complete Left  02/03/2014   CLINICAL DATA:  Fall  EXAM: LEFT HIP - COMPLETE 2+ VIEW  COMPARISON:  None.  FINDINGS: Sub trochanteric fracture on the left with angulation and medial displacement. Left hip joint appears normal. No other fracture.  IMPRESSION: Displaced subtrochanteric fracture left femur   Electronically Signed   By: Franchot Gallo M.D.   On: 02/03/2014 10:55   Dg Femur Left  02/04/2014   CLINICAL DATA:  Intra medullary nail fixation  EXAM: LEFT FEMUR - 2 VIEW  COMPARISON:  02/03/2014  FINDINGS: Status post open reduction and internal fixation of a subtrochanteric left femur fracture. There is no evidence intra procedural complication. The hip is located.  IMPRESSION: Proximal left femur fracture ORIF.  No adverse findings.   Electronically Signed   By: Jorje Guild M.D.   On: 02/04/2014 23:47   Dg Ankle Complete Right  02/03/2014   CLINICAL DATA:  Fall  EXAM: RIGHT ANKLE - COMPLETE 3+ VIEW  COMPARISON:  None.  FINDINGS: Oblique nondisplaced fracture of the distal fibula. No fracture of the tibia. Ankle  mortise intact.  Arterial calcification.  Calcaneal spurring.  IMPRESSION: Nondisplaced fracture distal fibula.   Electronically Signed   By: Franchot Gallo M.D.   On: 02/03/2014 10:59   Ct Head Wo Contrast  02/03/2014   CLINICAL DATA:  Fall  EXAM: CT HEAD WITHOUT CONTRAST  TECHNIQUE: Contiguous axial images were obtained from the base of the skull through the vertex without intravenous contrast.  COMPARISON:  None.  FINDINGS: No skull fracture is noted. The mastoid air cells are unremarkable. There is mucosal thickening with partial opacification left ethmoid air cells.  Mild cerebral atrophy. Mild periventricular white matter decreased attenuation probable due to chronic small vessel ischemic changes. No acute cortical infarction. No mass lesion is noted on this unenhanced scan.  IMPRESSION: No acute intracranial abnormality. Mild cerebral atrophy. Periventricular white matter decreased attenuation probable due to chronic small vessel ischemic changes.   Electronically Signed   By: Lahoma Crocker M.D.   On: 02/03/2014 11:13   Dg Chest Port 1 View  02/05/2014   CLINICAL DATA:  Cough.  Shortness of breath.  But fracture.  EXAM: PORTABLE CHEST - 1 VIEW  COMPARISON:  One-view chest 02/03/2014.  FINDINGS: The heart is enlarged. Moderate pulmonary vascular congestion is now present. Mild atelectasis is evident at the bases. No significant airspace consolidation is present. The visualized soft tissues and bony thorax are unremarkable.  IMPRESSION: 1. Stable cardiomegaly with new moderate pulmonary vascular congestion. Early failure is considered. 2. Mild bibasilar atelectasis without other significant airspace disease.   Electronically Signed   By: Lawrence Santiago M.D.   On: 02/05/2014 08:05   Dg Knee Complete 4 Views Left  02/03/2014   CLINICAL DATA:  Fall  EXAM: LEFT KNEE -  COMPLETE 4+ VIEW  COMPARISON:  None.  FINDINGS: There is no evidence of fracture, dislocation, or joint effusion. There is no evidence of  arthropathy or other focal bone abnormality. Soft tissues are unremarkable.  IMPRESSION: Negative.   Electronically Signed   By: Franchot Gallo M.D.   On: 02/03/2014 10:58   Dg Ankle Right Port  02/05/2014   CLINICAL DATA:  Right fibula fracture.  EXAM: PORTABLE RIGHT ANKLE - 2 VIEW  COMPARISON:  02/03/2014.  FINDINGS: A nondisplaced oblique fracture of the distal fibula is again demonstrated. No displacement or angulation. Interval fiberglass cast.  IMPRESSION: Anatomic position and alignment of the previously demonstrated distal fibular fracture with an interval cast.   Electronically Signed   By: Enrique Sack M.D.   On: 02/05/2014 08:55   Dg Foot 2 Views Right  02/05/2014   CLINICAL DATA:  Followup right first MTP fracture/dislocation.  EXAM: RIGHT FOOT - 2 VIEW  COMPARISON:  02/03/2014.  FINDINGS: Interval fiberglass cast extending to the level of the proximal first phalanx. There is continued dorsal and proximal dislocation of the first proximal phalanx relative to the first metatarsal with an associated mildly comminuted fracture of the first metatarsal head and displacement of two of the first metatarsal head fragments in conjunction with the proximal phalanx.  IMPRESSION: Stable fracture/dislocation at the first MTP joint, as described above   Electronically Signed   By: Enrique Sack M.D.   On: 02/05/2014 08:58   Dg Foot 2 Views Right  02/03/2014   CLINICAL DATA:  Postreduction.  EXAM: RIGHT FOOT - 2 VIEW  COMPARISON:  Right foot radiograph February 03, 2014 at 1018 hr.  FINDINGS: Suspected residual subluxation without frank dislocation of first metatarsophalangeal fracture dislocation seen on prior radiograph. Dorsal foot soft tissue swelling without subcutaneous gas radiopaque foreign bodies. Moderate vascular calcifications.  IMPRESSION: Improved alignment of first metatarsophalangeal fracture dislocation.   Electronically Signed   By: Elon Alas   On: 02/03/2014 12:33   Dg Foot Complete  Right  02/03/2014   CLINICAL DATA:  Fall  EXAM: RIGHT FOOT COMPLETE - 3+ VIEW  COMPARISON:  None.  FINDINGS: Dorsal dislocation of the first metatarsophalangeal joint. Possible small avulsion fracture.  No other fracture.  Diffuse arterial calcification.  IMPRESSION: Dislocation of the first MTP   Electronically Signed   By: Franchot Gallo M.D.   On: 02/03/2014 10:57   Dg C-arm 61-120 Min-no Report  02/04/2014   CLINICAL DATA: intraop   C-ARM 61-120 MINUTES  Fluoroscopy was utilized by the requesting physician.  No radiographic  interpretation.     CBC  Recent Labs Lab 02/03/14 0913 02/04/14 0553 02/05/14 0512 02/06/14 0405  WBC 14.2* 11.9* 14.7* 12.5*  HGB 14.2 13.2 12.9 11.5*  HCT 44.0 42.7 41.4 36.4  PLT 195 189 159 180  MCV 81.3 83.7 84.1 82.9  MCH 26.2 25.9* 26.2 26.2  MCHC 32.3 30.9 31.2 31.6  RDW 15.7* 16.0* 16.3* 16.4*  LYMPHSABS 1.8  --   --   --   MONOABS 0.7  --   --   --   EOSABS 0.5  --   --   --   BASOSABS 0.1  --   --   --     Chemistries   Recent Labs Lab 02/03/14 0913 02/04/14 0553 02/05/14 0512 02/06/14 0405  NA 144 142 144 139  K 2.9* 4.0 4.1 3.6*  CL 99 100 99 96  CO2 33* 34* 27 32  GLUCOSE 114*  195* 253* 263*  BUN 15 13 13 13   CREATININE 0.71 0.73 0.65 0.74  CALCIUM 9.7 8.8 9.0 8.7   ------------------------------------------------------------------------------------------------------------------ estimated creatinine clearance is 71.8 ml/min (by C-G formula based on Cr of 0.74). ------------------------------------------------------------------------------------------------------------------ No results found for this basename: HGBA1C,  in the last 72 hours ------------------------------------------------------------------------------------------------------------------ No results found for this basename: CHOL, HDL, LDLCALC, TRIG, CHOLHDL, LDLDIRECT,  in the last 72  hours ------------------------------------------------------------------------------------------------------------------ No results found for this basename: TSH, T4TOTAL, FREET3, T3FREE, THYROIDAB,  in the last 72 hours ------------------------------------------------------------------------------------------------------------------ No results found for this basename: VITAMINB12, FOLATE, FERRITIN, TIBC, IRON, RETICCTPCT,  in the last 72 hours  Coagulation profile  Recent Labs Lab 02/03/14 0913 02/04/14 1942  INR 1.03 1.08    No results found for this basename: DDIMER,  in the last 72 hours  Cardiac Enzymes  Recent Labs Lab 02/03/14 0913  TROPONINI <0.30   ------------------------------------------------------------------------------------------------------------------ No components found with this basename: POCBNP,      Time Spent in minutes  35   Tashyra Adduci K M.D on 02/06/2014 at 9:20 AM  Between 7am to 7pm - Pager - [phone]  After 7pm go to [email or web address] - password [email or web address]  And look for the night coverage person covering for me after hours  [facility] Office  [phone]   **Disclaimer: This note may have been dictated with voice recognition software. Similar sounding words can inadvertently be transcribed and this note may contain transcription errors which may not have been corrected upon publication of note.**

## 2014-02-07 LAB — TYPE AND SCREEN
ABO/RH(D): O POS
Antibody Screen: NEGATIVE
UNIT DIVISION: 0
Unit division: 0

## 2014-02-07 LAB — BASIC METABOLIC PANEL
Anion gap: 8 (ref 5–15)
BUN: 9 mg/dL (ref 6–23)
CHLORIDE: 95 meq/L — AB (ref 96–112)
CO2: 34 mEq/L — ABNORMAL HIGH (ref 19–32)
Calcium: 8.9 mg/dL (ref 8.4–10.5)
Creatinine, Ser: 0.63 mg/dL (ref 0.50–1.10)
GFR, EST NON AFRICAN AMERICAN: 88 mL/min — AB (ref >90)
Glucose, Bld: 180 mg/dL — ABNORMAL HIGH (ref 70–99)
POTASSIUM: 4.5 meq/L (ref 3.7–5.3)
SODIUM: 137 meq/L (ref 137–147)

## 2014-02-07 LAB — CBC
HCT: 38.3 % (ref 36.0–46.0)
Hemoglobin: 11.9 g/dL — ABNORMAL LOW (ref 12.0–15.0)
MCH: 26.2 pg (ref 26.0–34.0)
MCHC: 31.1 g/dL (ref 30.0–36.0)
MCV: 84.2 fL (ref 78.0–100.0)
Platelets: 138 10*3/uL — ABNORMAL LOW (ref 150–400)
RBC: 4.55 MIL/uL (ref 3.87–5.11)
RDW: 16.3 % — AB (ref 11.5–15.5)
WBC: 12.1 10*3/uL — AB (ref 4.0–10.5)

## 2014-02-07 LAB — GLUCOSE, CAPILLARY
GLUCOSE-CAPILLARY: 171 mg/dL — AB (ref 70–99)
GLUCOSE-CAPILLARY: 209 mg/dL — AB (ref 70–99)
Glucose-Capillary: 152 mg/dL — ABNORMAL HIGH (ref 70–99)
Glucose-Capillary: 143 mg/dL — ABNORMAL HIGH (ref 70–99)
Glucose-Capillary: 219 mg/dL — ABNORMAL HIGH (ref 70–99)
Glucose-Capillary: 169 mg/dL — ABNORMAL HIGH (ref 70–99)

## 2014-02-07 MED ORDER — OXYCODONE HCL 5 MG PO TABS: 5.0000 mg | ORAL_TABLET | ORAL | Status: DC | PRN

## 2014-02-07 MED ORDER — ENOXAPARIN SODIUM 40 MG/0.4ML ~~LOC~~ SOLN: 40.0000 mg | SUBCUTANEOUS | Status: DC

## 2014-02-07 MED ORDER — SODIUM CHLORIDE 0.9 % IV SOLN
INTRAVENOUS | Status: DC
  Administered 2014-02-07: 75 mL/h via INTRAVENOUS

## 2014-02-07 MED ORDER — POLYETHYLENE GLYCOL 3350 17 G PO PACK
17.0000 g | PACK | Freq: Two times a day (BID) | ORAL | Status: DC
  Administered 2014-02-07 – 2014-02-10 (×5): 17 g via ORAL
  Filled 2014-02-07 (×7): qty 1

## 2014-02-07 MED ORDER — CLINDAMYCIN PHOSPHATE 900 MG/50ML IV SOLN
900.0000 mg | INTRAVENOUS | Status: AC
  Administered 2014-02-08: 900 mg via INTRAVENOUS
  Filled 2014-02-07: qty 50

## 2014-02-07 MED ORDER — CHLORHEXIDINE GLUCONATE 4 % EX LIQD
60.0000 mL | Freq: Once | CUTANEOUS | Status: DC
  Filled 2014-02-07: qty 60

## 2014-02-07 MED ORDER — DOCUSATE SODIUM 100 MG PO CAPS
200.0000 mg | ORAL_CAPSULE | Freq: Two times a day (BID) | ORAL | Status: DC
  Administered 2014-02-07 – 2014-02-10 (×5): 200 mg via ORAL
  Filled 2014-02-07 (×7): qty 2

## 2014-02-07 MED ORDER — BISACODYL 10 MG RE SUPP
10.0000 mg | Freq: Every day | RECTAL | Status: DC
  Administered 2014-02-07 – 2014-02-10 (×2): 10 mg via RECTAL
  Filled 2014-02-07 (×2): qty 1

## 2014-02-07 NOTE — Consult Note (Signed)
Reason for Consult:  Right foot and ankle injuries Referring Physician:  Dr. Johann Capers is an 73 y.o. female.  HPI:  73 y/o female with PMH of aortic stenosis and diabetes c/o R ankle and great toe pain since a fall Friday.  She was initially seen at [facility] and then sent to [facility] for treatment of her subtroch fracture.  She underwent IM nailing by Dr. Alvan Dame.  She c/o R foot and ankle pain that is moderate in severity.  By report she underwent closed reduction of the right hallux at [facility] by Dr. Luna Glasgow but was found to be dislocated again here at [facility].  Husband is by the bedside.  Past Medical History  Diagnosis Date  . HTN (hypertension)   . Diabetes mellitus without complication   . High cholesterol   . Spasm of back muscles     History reviewed. No pertinent past surgical history.  Family History  Problem Relation Age of Onset  . Diabetes Other     Social History:  reports that she has never smoked. She has never used smokeless tobacco. She reports that she does not drink alcohol or use illicit drugs.  Allergies:  Allergies  Allergen Reactions  . Iodine Anaphylaxis  . Penicillins Anaphylaxis  . Shellfish Allergy Anaphylaxis  . Sulfa Antibiotics Anaphylaxis    Medications: I have reviewed the patient's current medications.  Results for orders placed during the hospital encounter of 02/03/14 (from the past 48 hour(s))  URINALYSIS, ROUTINE W REFLEX MICROSCOPIC     Status: Abnormal   Collection Time    02/05/14 11:40 AM      Result Value Ref Range   Color, Urine YELLOW  YELLOW   APPearance CLOUDY (*) CLEAR   Specific Gravity, Urine 1.018  1.005 - 1.030   pH 6.0  5.0 - 8.0   Glucose, UA >1000 (*) NEGATIVE mg/dL   Hgb urine dipstick LARGE (*) NEGATIVE   Bilirubin Urine SMALL (*) NEGATIVE   Ketones, ur 15 (*) NEGATIVE mg/dL   Protein, ur NEGATIVE  NEGATIVE mg/dL   Urobilinogen, UA 1.0  0.0 - 1.0 mg/dL   Nitrite NEGATIVE  NEGATIVE   Leukocytes, UA MODERATE  (*) NEGATIVE  URINE MICROSCOPIC-ADD ON     Status: None   Collection Time    02/05/14 11:40 AM      Result Value Ref Range   Squamous Epithelial / LPF RARE  RARE   WBC, UA 11-20  <3 WBC/hpf   RBC / HPF 3-6  <3 RBC/hpf   Urine-Other FEW YEAST    GLUCOSE, CAPILLARY     Status: Abnormal   Collection Time    02/05/14 11:48 AM      Result Value Ref Range   Glucose-Capillary 300 (*) 70 - 99 mg/dL   Comment 1 Documented in Chart     Comment 2 Notify RN    GLUCOSE, CAPILLARY     Status: Abnormal   Collection Time    02/05/14  4:24 PM      Result Value Ref Range   Glucose-Capillary 224 (*) 70 - 99 mg/dL   Comment 1 Documented in Chart     Comment 2 Notify RN    GLUCOSE, CAPILLARY     Status: Abnormal   Collection Time    02/05/14  9:28 PM      Result Value Ref Range   Glucose-Capillary 397 (*) 70 - 99 mg/dL   Comment 1 Notify RN  CBC     Status: Abnormal   Collection Time    02/06/14  4:05 AM      Result Value Ref Range   WBC 12.5 (*) 4.0 - 10.5 K/uL   Comment: WHITE COUNT CONFIRMED ON SMEAR     REPEATED TO VERIFY   RBC 4.39  3.87 - 5.11 MIL/uL   Hemoglobin 11.5 (*) 12.0 - 15.0 g/dL   HCT 36.4  36.0 - 46.0 %   MCV 82.9  78.0 - 100.0 fL   MCH 26.2  26.0 - 34.0 pg   MCHC 31.6  30.0 - 36.0 g/dL   RDW 16.4 (*) 11.5 - 15.5 %   Platelets 180  150 - 400 K/uL  BASIC METABOLIC PANEL     Status: Abnormal   Collection Time    02/06/14  4:05 AM      Result Value Ref Range   Sodium 139  137 - 147 mEq/L   Potassium 3.6 (*) 3.7 - 5.3 mEq/L   Chloride 96  96 - 112 mEq/L   CO2 32  19 - 32 mEq/L   Glucose, Bld 263 (*) 70 - 99 mg/dL   BUN 13  6 - 23 mg/dL   Creatinine, Ser 0.74  0.50 - 1.10 mg/dL   Calcium 8.7  8.4 - 10.5 mg/dL   GFR calc non Af Amer 84 (*) >90 mL/min   GFR calc Af Amer >90  >90 mL/min   Comment: (NOTE)     The eGFR has been calculated using the CKD EPI equation.     This calculation has not been validated in all clinical situations.     eGFR's persistently <90  mL/min signify possible Chronic Kidney     Disease.   Anion gap 11  5 - 15  GLUCOSE, CAPILLARY     Status: Abnormal   Collection Time    02/06/14  6:15 AM      Result Value Ref Range   Glucose-Capillary 246 (*) 70 - 99 mg/dL   Comment 1 Notify RN    GLUCOSE, CAPILLARY     Status: Abnormal   Collection Time    02/06/14 12:04 PM      Result Value Ref Range   Glucose-Capillary 280 (*) 70 - 99 mg/dL  GLUCOSE, CAPILLARY     Status: Abnormal   Collection Time    02/06/14  4:05 PM      Result Value Ref Range   Glucose-Capillary 263 (*) 70 - 99 mg/dL  GLUCOSE, CAPILLARY     Status: Abnormal   Collection Time    02/06/14  9:53 PM      Result Value Ref Range   Glucose-Capillary 197 (*) 70 - 99 mg/dL  CBC     Status: Abnormal   Collection Time    02/07/14  5:00 AM      Result Value Ref Range   WBC 12.1 (*) 4.0 - 10.5 K/uL   RBC 4.55  3.87 - 5.11 MIL/uL   Hemoglobin 11.9 (*) 12.0 - 15.0 g/dL   HCT 38.3  36.0 - 46.0 %   MCV 84.2  78.0 - 100.0 fL   MCH 26.2  26.0 - 34.0 pg   MCHC 31.1  30.0 - 36.0 g/dL   RDW 16.3 (*) 11.5 - 15.5 %   Platelets 138 (*) 150 - 400 K/uL   Comment: REPEATED TO VERIFY     DELTA CHECK NOTED  BASIC METABOLIC PANEL     Status: Abnormal  Collection Time    02/07/14  5:00 AM      Result Value Ref Range   Sodium 137  137 - 147 mEq/L   Potassium 4.5  3.7 - 5.3 mEq/L   Comment: DELTA CHECK NOTED   Chloride 95 (*) 96 - 112 mEq/L   CO2 34 (*) 19 - 32 mEq/L   Glucose, Bld 180 (*) 70 - 99 mg/dL   BUN 9  6 - 23 mg/dL   Creatinine, Ser 0.63  0.50 - 1.10 mg/dL   Calcium 8.9  8.4 - 10.5 mg/dL   GFR calc non Af Amer 88 (*) >90 mL/min   GFR calc Af Amer >90  >90 mL/min   Comment: (NOTE)     The eGFR has been calculated using the CKD EPI equation.     This calculation has not been validated in all clinical situations.     eGFR's persistently <90 mL/min signify possible Chronic Kidney     Disease.   Anion gap 8  5 - 15  GLUCOSE, CAPILLARY     Status: Abnormal    Collection Time    02/07/14  6:39 AM      Result Value Ref Range   Glucose-Capillary 171 (*) 70 - 99 mg/dL    Dg Ankle Right Port  02/05/2014   CLINICAL DATA:  Right fibula fracture.  EXAM: PORTABLE RIGHT ANKLE - 2 VIEW  COMPARISON:  02/03/2014.  FINDINGS: A nondisplaced oblique fracture of the distal fibula is again demonstrated. No displacement or angulation. Interval fiberglass cast.  IMPRESSION: Anatomic position and alignment of the previously demonstrated distal fibular fracture with an interval cast.   Electronically Signed   By: Enrique Sack M.D.   On: 02/05/2014 08:55   Dg Foot 2 Views Right  02/05/2014   CLINICAL DATA:  Followup right first MTP fracture/dislocation.  EXAM: RIGHT FOOT - 2 VIEW  COMPARISON:  02/03/2014.  FINDINGS: Interval fiberglass cast extending to the level of the proximal first phalanx. There is continued dorsal and proximal dislocation of the first proximal phalanx relative to the first metatarsal with an associated mildly comminuted fracture of the first metatarsal head and displacement of two of the first metatarsal head fragments in conjunction with the proximal phalanx.  IMPRESSION: Stable fracture/dislocation at the first MTP joint, as described above   Electronically Signed   By: Enrique Sack M.D.   On: 02/05/2014 08:58    ROS:  No recent f/c/n/v/wt loss PE:  Blood pressure 154/56, pulse 80, temperature 97.9 F (36.6 C), temperature source Oral, resp. rate 16, height '5\' 5"'  (1.651 m), weight 90.719 kg (200 lb), SpO2 99.00%. wn wd woman in nad.  Alert and oriented to person.  EOMI.  Resp unlabored.  R LE immobilized in short leg splint.  Skin at forefoot intact.  Slight swelling at hallux.  Brisk cap refill at the toes.  Sens to LT intact at the forefoot.  4/5 strength in PF and DF of the toes.  No lymphadenopathy at the forefoot.  Assessment/Plan: R hallux MPJ fracture / dislocation - to OR for ORIF tomorrow.  NPO after midnight.  R ankle nondisplaced fracture -  I believe this injury can be managed safely in closed fashion.  She will likely be PWB for transfers on the R LE post op.  She and her husband understand the plan and agree.  Wylene Simmer 02/07/2014, 7:19 AM

## 2014-02-07 NOTE — Progress Notes (Signed)
Physical Therapy Treatment Patient Details Name: [name] MRN: [id number] DOB: October 11, 1940 Today's Date: 02/07/2014    History of Present Illness pt presents post fall sustaining R fibula and 1st metatarsal fxs and L subtrochanteric fx post ORIF.      PT Comments    Pt with improved cognition today, though continues to be very limited with mobility.  Pt needs Maxi Move for any OOB activity.  Plan for OR tomorrow.  Will need new orders post-op.    Follow Up Recommendations  SNF     Equipment Recommendations   (TBD)    Recommendations for Other Services       Precautions / Restrictions Precautions Precautions: Fall Restrictions Weight Bearing Restrictions: Yes RLE Weight Bearing: Non weight bearing LLE Weight Bearing: Partial weight bearing LLE Partial Weight Bearing Percentage or Pounds: 50%    Mobility  Bed Mobility Overal bed mobility: Needs Assistance;+2 for physical assistance Bed Mobility: Rolling Rolling: Max assist;+2 for physical assistance         General bed mobility comments: cues and hand over hand A for safe technique to roll to R side.  Utilized pad under hips.    Transfers Overall transfer level: Needs assistance               General transfer comment: Utilized maxi move for OOB to chair.    Ambulation/Gait                 Stairs            Wheelchair Mobility    Modified Rankin (Stroke Patients Only)       Balance                                    Cognition Arousal/Alertness: Awake/alert Behavior During Therapy: WFL for tasks assessed/performed Overall Cognitive Status: Impaired/Different from baseline Area of Impairment: Memory;Following commands;Problem solving;Attention   Current Attention Level: Alternating Memory: Decreased short-term memory;Decreased recall of precautions Following Commands: Follows one step commands with increased time;Follows multi-step commands with increased time    Awareness: Anticipatory Problem Solving: Slow processing;Difficulty sequencing;Requires verbal cues;Requires tactile cues General Comments: pt with improved cognition today, though continues with memory and attention deficits.  Unclear if this is baseline.    Exercises General Exercises - Lower Extremity Quad Sets: AROM;Both;10 reps Heel Slides: AAROM;Both;5 reps Hip ABduction/ADduction: AAROM;Both;10 reps    General Comments        Pertinent Vitals/Pain Did not rate, but indicates L hip most painful area.  Premedicated.      Home Living                      Prior Function            PT Goals (current goals can now be found in the care plan section) Acute Rehab PT Goals Time For Goal Achievement: 02/19/14 Potential to Achieve Goals: Fair Progress towards PT goals: Progressing toward goals    Frequency  Min 3X/week    PT Plan Current plan remains appropriate    Co-evaluation             End of Session Equipment Utilized During Treatment: Oxygen Activity Tolerance: Patient limited by fatigue;Patient limited by pain Patient left: in chair;with call bell/phone within reach;with family/visitor present     Time: 1117-1208 PT Time Calculation (min): 51 min  Charges:  $Therapeutic Exercise: 8-22 mins $Therapeutic  Activity: 23-37 mins                    G CodesCatarina Hartshorn, [place] 02/07/2014, 2:22 PM

## 2014-02-07 NOTE — Progress Notes (Signed)
Ref: PROVIDER NOT IN SYSTEM   Subjective:  Awaiting right great toe surgery for swelling and displaced fracture post reduction. Decreasing confusion  Objective:  Vital Signs in the last 24 hours: Temp:  [97.9 F (36.6 C)-98 F (36.7 C)] 97.9 F (36.6 C) (07/06 0636) Pulse Rate:  [80-92] 80 (07/06 0636) Cardiac Rhythm:  [-]  Resp:  [16] 16 (07/06 0636) BP: (149-157)/(56-62) 154/56 mmHg (07/06 0636) SpO2:  [98 %-99 %] 99 % (07/06 0636)  Physical Exam: BP Readings from Last 1 Encounters:  02/07/14 154/56     Wt Readings from Last 1 Encounters:  02/04/14 90.719 kg (200 lb)    Weight change:   HEENT: [place]/AT, Eyes-Brown, [name], EOMI, Conjunctiva-Pink, Sclera-Non-icteric Neck: No JVD, No bruit, Trachea midline. Lungs:  Clear, Bilateral. Cardiac:  Regular rhythm, normal S1 and S2, no S3. III/VI systolic murmur. Abdomen:  Soft, non-tender. Extremities:  No edema present. No cyanosis. No clubbing. Right lower leg and foot in cast and left hip surgical scar. CNS: AxOx2, Cranial nerves grossly intact, moves lower extremities 20 %. Right handed. Skin: Warm and dry.   Intake/Output from previous day: 07/05 0701 - 07/06 0700 In: 720 [P.O.:720] Out: 1 [Urine:1]    Lab Results: BMET    Component Value Date/Time   NA 137 02/07/2014 0500   NA 139 02/06/2014 0405   NA 144 02/05/2014 0512   K 4.5 02/07/2014 0500   K 3.6* 02/06/2014 0405   K 4.1 02/05/2014 0512   CL 95* 02/07/2014 0500   CL 96 02/06/2014 0405   CL 99 02/05/2014 0512   CO2 34* 02/07/2014 0500   CO2 32 02/06/2014 0405   CO2 27 02/05/2014 0512   GLUCOSE 180* 02/07/2014 0500   GLUCOSE 263* 02/06/2014 0405   GLUCOSE 253* 02/05/2014 0512   BUN 9 02/07/2014 0500   BUN 13 02/06/2014 0405   BUN 13 02/05/2014 0512   CREATININE 0.63 02/07/2014 0500   CREATININE 0.74 02/06/2014 0405   CREATININE 0.65 02/05/2014 0512   CALCIUM 8.9 02/07/2014 0500   CALCIUM 8.7 02/06/2014 0405   CALCIUM 9.0 02/05/2014 0512   GFRNONAA 88* 02/07/2014 0500   GFRNONAA 84* 02/06/2014 0405    GFRNONAA 87* 02/05/2014 0512   GFRAA >90 02/07/2014 0500   GFRAA >90 02/06/2014 0405   GFRAA >90 02/05/2014 0512   CBC    Component Value Date/Time   WBC 12.1* 02/07/2014 0500   RBC 4.55 02/07/2014 0500   HGB 11.9* 02/07/2014 0500   HCT 38.3 02/07/2014 0500   PLT 138* 02/07/2014 0500   MCV 84.2 02/07/2014 0500   MCH 26.2 02/07/2014 0500   MCHC 31.1 02/07/2014 0500   RDW 16.3* 02/07/2014 0500   LYMPHSABS 1.8 02/03/2014 0913   MONOABS 0.7 02/03/2014 0913   EOSABS 0.5 02/03/2014 0913   BASOSABS 0.1 02/03/2014 0913   HEPATIC Function Panel No results found for this basename: PROT,  ALBUMIN,  AST,  ALT,  ALKPHOS,  BILIDIR,  IBILI,  in the last 8760 hours HEMOGLOBIN A1C No components found with this basename: HGA1C,  MPG   CARDIAC ENZYMES Lab Results  Component Value Date   TROPONINI <0.30 02/03/2014   BNP No results found for this basename: PROBNP,  in the last 8760 hours TSH No results found for this basename: TSH,  in the last 8760 hours CHOLESTEROL No results found for this basename: CHOL,  in the last 8760 hours  Scheduled Meds: . amitriptyline  10 mg Oral QHS  . amLODipine  5 mg Oral Daily  . atorvastatin  20 mg Oral QHS  . carvedilol  3.125 mg Oral BID WC  . docusate sodium  100 mg Oral BID  . fluticasone  1 spray Each Nare Daily  . gabapentin  100 mg Oral BID  . hydrochlorothiazide  25 mg Oral Daily  . hydrocortisone  1 application Topical QID  . insulin aspart  0-15 Units Subcutaneous TID WC  . insulin aspart  0-5 Units Subcutaneous QHS  . insulin detemir  40 Units Subcutaneous BID  . levofloxacin  500 mg Oral Daily  . pantoprazole  40 mg Oral Daily   Continuous Infusions:  PRN Meds:.acetaminophen, alum & mag hydroxide-simeth, haloperidol lactate, hydrALAZINE, HYDROmorphone (DILAUDID) injection, ondansetron (ZOFRAN) IV, oxyCODONE, zolpidem  Assessment/Plan: Moderate aortic stenosis  Abnormal EKG  LV hypertrophy with repolarization changes or inferior ischemia.  Acute displaced left  hip ([place]) subtrochanteric fracture  Acute right first metatarsophalangeal fracture dislocation.  Acute nondisplaced fracture of the distal right fibula  DM, II, uncontrolled  Hypertension  Morbid obesity  Hypokalemia-Corrected  Confusion- improving  Will follow.   LOS: 4 days    Dixie Dials  MD  02/07/2014, 9:37 AM

## 2014-02-07 NOTE — Progress Notes (Signed)
CSW spoke to [facility] who states they can take patient when medically ready pending insurance auth. CSW went to speak to patient and patient's family by bedside to inform of [facility]'s bed offer. CSW continues to follow  [name], [name], [name]

## 2014-02-07 NOTE — Progress Notes (Signed)
Patient Demographics  [name], is a 73 y.o. female, DOB - 1940-08-18, FWY:[id number]  Admit date - 02/03/2014   Admitting Physician Mauri Pole, MD  Outpatient Primary MD for the patient is PROVIDER NOT [place]  LOS - 4   Chief Complaint  Patient presents with  . Fall  . Hip Injury         Subjective:   [name] today has, No headache, No chest pain, No abdominal pain - No Nausea, No new weakness tingling or numbness, No Cough - SOB. Feels better less confused today.     Assessment & Plan    1. Mech fall with left intertrochanteric fracture, right distal fibula and first metatarsophalangeal joint fracture.   Seen by Dr. Alvan Dame underwent open reduction internal fixation of left intertrochanteric fracture. The right Leg on the ACE splint, current weight bearing status is no weight bearing on the right partial weightbearing on the left, with patient's weight and delirium or any activity is very high risk for fall. Will require placement, PT following.   R ankle nondisplaced fracture - I believe this injury can be managed safely in closed fashion. She will likely be PWB for transfers on the R LE post op.    R hallux MPJ fracture / dislocation - to OR for ORIF 02-08-14 - [facility]     DVT prophylaxis Lovenox received last dose 02/07/2014 a.m. then stopped for possible surgery on 02/08/2014 by orthopedics.       2. Mild aortic stenosis noted on echogram done in regional with nonspecific EKG changes. Seen by cardiologist Dr. Doylene Canard, will place her on her home dose aspirin 81 mg from today, no acute issues. Chest pain-free we'll follow with Dr. Doylene Canard postdischarge.     3. Delirium. Minimize narcotics and benzodiazepines, QTC is stable, as needed Haldol. Fall and aspiration  precautions. Family educated bedside. They are okay with restraints as needed. Delirium has improved.     4. Hypertension. Currently on Norvasc, add Coreg for better control. On when necessary hydralazine.    5. DM type II. Outpatient control poor, A1c greater than 11. We'll place her on split dose Levimir increased to 40 units twice a day, continue sliding scale insulin.  CBG (last 3)   Recent Labs  02/06/14 1605 02/06/14 2153 02/07/14 0639  GLUCAP 263* 197* 171*       5. Dyslipidemia. Home dose statin continued.     6. Underlying chronic diastolic CHF with mild acute decompensation as evident on chest x-ray and physical exam. Resolved after Low-dose Lasix, place on home dose HCTZ, continue fluid and salt restriction, monitor clinically. EF is 60% with mild aortic stenosis on echogram.     7. Lower extremity edema. Due to mild acute on chronic diastolic CHF, diuresed with Lasix, now placed on home dose HCTZ, lower extremity venous duplex unremarkable.      8. Mild leukocytosis. due to UTI placed on Levaquin for 3 days pharmacy to dose.     9.GERD - On PPI     10. Diabetic neuropathy. On Neurontin at a lower than home dose due to confusion, stable.      11.Low K - replaced and stable      Code Status: Full  Family Communication: Husband and daughter bedside 02/05/2014, Daughter 02/06/2014, husband 02/07/2014  Disposition Plan: Likely SNF    Procedures     ORIF L Hip 02-04-14 Dr Alvan Dame   R hallux MPJ fracture / dislocation - to OR for ORIF 02-08-14 - [facility]     Echo    Impressions:  - Moderate to severe LVH with LVEF 09-81%, grade 1 diastolic dysfunction. Severe left atrial enlargement. 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 Unable to assess PASP    Consults  Ortho, Cards   Medications  Scheduled Meds: . amitriptyline  10 mg Oral QHS  . amLODipine  5 mg Oral Daily  . atorvastatin  20  mg Oral QHS  . carvedilol  3.125 mg Oral BID WC  . docusate sodium  100 mg Oral BID  . fluticasone  1 spray Each Nare Daily  . gabapentin  100 mg Oral BID  . hydrochlorothiazide  25 mg Oral Daily  . hydrocortisone  1 application Topical QID  . insulin aspart  0-15 Units Subcutaneous TID WC  . insulin aspart  0-5 Units Subcutaneous QHS  . insulin detemir  40 Units Subcutaneous BID  . levofloxacin  500 mg Oral Daily  . pantoprazole  40 mg Oral Daily   Continuous Infusions:  PRN Meds:.acetaminophen, alum & mag hydroxide-simeth, haloperidol lactate, hydrALAZINE, HYDROmorphone (DILAUDID) injection, ondansetron (ZOFRAN) IV, oxyCODONE, zolpidem    DVT Prophylaxis  Lovenox  - SCDs   Lab Results  Component Value Date   PLT 138* 02/07/2014      Antibiotics     Anti-infectives   Start     Dose/Rate Route Frequency Ordered Stop   02/06/14 1000  levofloxacin (LEVAQUIN) tablet 500 mg    Comments:  Pharmacy can adjust for UTI   500 mg Oral Daily 02/06/14 0922 02/09/14 0959   02/04/14 2116  ceFAZolin (ANCEF) 2-3 GM-% IVPB SOLR    Comments:  Ubaldo Glassing   : cabinet override      02/04/14 2116 02/05/14 0929   02/04/14 0600  vancomycin (VANCOCIN) IVPB 1000 mg/200 mL premix  Status:  Discontinued     1,000 mg 200 mL/hr over 60 Minutes Intravenous On call to O.R. 02/03/14 1751 02/04/14 2304          Objective:   Filed Vitals:   02/06/14 0537 02/06/14 1550 02/06/14 2150 02/07/14 0636  BP: 144/62 157/62 149/58 154/56  Pulse: 102 92 85 80  Temp: 98.9 F (37.2 C) 98 F (36.7 C) 97.9 F (36.6 C) 97.9 F (36.6 C)  TempSrc:  Oral Oral Oral  Resp: 16 16 16 16   Height:      Weight:      SpO2: 93% 98% 98% 99%    Wt Readings from Last 3 Encounters:  02/04/14 90.719 kg (200 lb)  02/04/14 90.719 kg (200 lb)  02/04/14 90.719 kg (200 lb)     Intake/Output Summary (Last 24 hours) at 02/07/14 0933 Last data filed at 02/07/14 0500  Gross per 24 hour  Intake    480 ml  Output       1 ml  Net    479 ml     Physical Exam  Awake but pleasantly confused, No new F.N deficits, Normal affect [place].AT,PERRAL Supple Neck,No JVD, No cervical lymphadenopathy appriciated.  Symmetrical Chest wall movement, Good air movement bilaterally, CTAB RRR,No Gallops,Rubs or new Murmurs, No Parasternal Heave +ve B.Sounds, Abd Soft, No tenderness, No organomegaly appriciated, No rebound - guarding or  rigidity. No Cyanosis, Clubbing , No new Rash or bruise, R.Leg in ACE wrap, trace bipedal edema     Data Review   Micro Results Recent Results (from the past 240 hour(s))  MRSA PCR SCREENING     Status: None   Collection Time    02/03/14  7:03 PM      Result Value Ref Range Status   MRSA by PCR NEGATIVE  NEGATIVE Final   Comment:            The GeneXpert MRSA Assay (FDA     approved for NASAL specimens     only), is one component of a     comprehensive MRSA colonization     surveillance program. It is not     intended to diagnose MRSA     infection nor to guide or     monitor treatment for     MRSA infections.    Radiology Reports Dg Chest 1 View  02/03/2014   CLINICAL DATA:  Fall.  Hip injury  EXAM: CHEST - 1 VIEW  COMPARISON:  None.  FINDINGS: Cardiac enlargement without heart failure. Lungs are clear. No displaced rib fracture.  IMPRESSION: Cardiac enlargement.  No active cardiopulmonary disease.   Electronically Signed   By: Franchot Gallo M.D.   On: 02/03/2014 10:54   Dg Hip Complete Left  02/03/2014   CLINICAL DATA:  Fall  EXAM: LEFT HIP - COMPLETE 2+ VIEW  COMPARISON:  None.  FINDINGS: Sub trochanteric fracture on the left with angulation and medial displacement. Left hip joint appears normal. No other fracture.  IMPRESSION: Displaced subtrochanteric fracture left femur   Electronically Signed   By: Franchot Gallo M.D.   On: 02/03/2014 10:55   Dg Femur Left  02/04/2014   CLINICAL DATA:  Intra medullary nail fixation  EXAM: LEFT FEMUR - 2 VIEW  COMPARISON:  02/03/2014   FINDINGS: Status post open reduction and internal fixation of a subtrochanteric left femur fracture. There is no evidence intra procedural complication. The hip is located.  IMPRESSION: Proximal left femur fracture ORIF.  No adverse findings.   Electronically Signed   By: Jorje Guild M.D.   On: 02/04/2014 23:47   Dg Ankle Complete Right  02/03/2014   CLINICAL DATA:  Fall  EXAM: RIGHT ANKLE - COMPLETE 3+ VIEW  COMPARISON:  None.  FINDINGS: Oblique nondisplaced fracture of the distal fibula. No fracture of the tibia. Ankle mortise intact.  Arterial calcification.  Calcaneal spurring.  IMPRESSION: Nondisplaced fracture distal fibula.   Electronically Signed   By: Franchot Gallo M.D.   On: 02/03/2014 10:59   Ct Head Wo Contrast  02/03/2014   CLINICAL DATA:  Fall  EXAM: CT HEAD WITHOUT CONTRAST  TECHNIQUE: Contiguous axial images were obtained from the base of the skull through the vertex without intravenous contrast.  COMPARISON:  None.  FINDINGS: No skull fracture is noted. The mastoid air cells are unremarkable. There is mucosal thickening with partial opacification left ethmoid air cells.  Mild cerebral atrophy. Mild periventricular white matter decreased attenuation probable due to chronic small vessel ischemic changes. No acute cortical infarction. No mass lesion is noted on this unenhanced scan.  IMPRESSION: No acute intracranial abnormality. Mild cerebral atrophy. Periventricular white matter decreased attenuation probable due to chronic small vessel ischemic changes.   Electronically Signed   By: Lahoma Crocker M.D.   On: 02/03/2014 11:13   Dg Chest Port 1 View  02/05/2014   CLINICAL DATA:  Cough.  Shortness of breath.  But fracture.  EXAM: PORTABLE CHEST - 1 VIEW  COMPARISON:  One-view chest 02/03/2014.  FINDINGS: The heart is enlarged. Moderate pulmonary vascular congestion is now present. Mild atelectasis is evident at the bases. No significant airspace consolidation is present. The visualized soft  tissues and bony thorax are unremarkable.  IMPRESSION: 1. Stable cardiomegaly with new moderate pulmonary vascular congestion. Early failure is considered. 2. Mild bibasilar atelectasis without other significant airspace disease.   Electronically Signed   By: Lawrence Santiago M.D.   On: 02/05/2014 08:05   Dg Knee Complete 4 Views Left  02/03/2014   CLINICAL DATA:  Fall  EXAM: LEFT KNEE - COMPLETE 4+ VIEW  COMPARISON:  None.  FINDINGS: There is no evidence of fracture, dislocation, or joint effusion. There is no evidence of arthropathy or other focal bone abnormality. Soft tissues are unremarkable.  IMPRESSION: Negative.   Electronically Signed   By: Franchot Gallo M.D.   On: 02/03/2014 10:58   Dg Ankle Right Port  02/05/2014   CLINICAL DATA:  Right fibula fracture.  EXAM: PORTABLE RIGHT ANKLE - 2 VIEW  COMPARISON:  02/03/2014.  FINDINGS: A nondisplaced oblique fracture of the distal fibula is again demonstrated. No displacement or angulation. Interval fiberglass cast.  IMPRESSION: Anatomic position and alignment of the previously demonstrated distal fibular fracture with an interval cast.   Electronically Signed   By: Enrique Sack M.D.   On: 02/05/2014 08:55   Dg Foot 2 Views Right  02/05/2014   CLINICAL DATA:  Followup right first MTP fracture/dislocation.  EXAM: RIGHT FOOT - 2 VIEW  COMPARISON:  02/03/2014.  FINDINGS: Interval fiberglass cast extending to the level of the proximal first phalanx. There is continued dorsal and proximal dislocation of the first proximal phalanx relative to the first metatarsal with an associated mildly comminuted fracture of the first metatarsal head and displacement of two of the first metatarsal head fragments in conjunction with the proximal phalanx.  IMPRESSION: Stable fracture/dislocation at the first MTP joint, as described above   Electronically Signed   By: Enrique Sack M.D.   On: 02/05/2014 08:58   Dg Foot 2 Views Right  02/03/2014   CLINICAL DATA:  Postreduction.  EXAM:  RIGHT FOOT - 2 VIEW  COMPARISON:  Right foot radiograph February 03, 2014 at 1018 hr.  FINDINGS: Suspected residual subluxation without frank dislocation of first metatarsophalangeal fracture dislocation seen on prior radiograph. Dorsal foot soft tissue swelling without subcutaneous gas radiopaque foreign bodies. Moderate vascular calcifications.  IMPRESSION: Improved alignment of first metatarsophalangeal fracture dislocation.   Electronically Signed   By: Elon Alas   On: 02/03/2014 12:33   Dg Foot Complete Right  02/03/2014   CLINICAL DATA:  Fall  EXAM: RIGHT FOOT COMPLETE - 3+ VIEW  COMPARISON:  None.  FINDINGS: Dorsal dislocation of the first metatarsophalangeal joint. Possible small avulsion fracture.  No other fracture.  Diffuse arterial calcification.  IMPRESSION: Dislocation of the first MTP   Electronically Signed   By: Franchot Gallo M.D.   On: 02/03/2014 10:57   Dg C-arm 61-120 Min-no Report  02/04/2014   CLINICAL DATA: intraop   C-ARM 61-120 MINUTES  Fluoroscopy was utilized by the requesting physician.  No radiographic  interpretation.     CBC  Recent Labs Lab 02/03/14 0913 02/04/14 0553 02/05/14 0512 02/06/14 0405 02/07/14 0500  WBC 14.2* 11.9* 14.7* 12.5* 12.1*  HGB 14.2 13.2 12.9 11.5* 11.9*  HCT 44.0 42.7 41.4 36.4 38.3  PLT  195 189 159 180 138*  MCV 81.3 83.7 84.1 82.9 84.2  MCH 26.2 25.9* 26.2 26.2 26.2  MCHC 32.3 30.9 31.2 31.6 31.1  RDW 15.7* 16.0* 16.3* 16.4* 16.3*  LYMPHSABS 1.8  --   --   --   --   MONOABS 0.7  --   --   --   --   EOSABS 0.5  --   --   --   --   BASOSABS 0.1  --   --   --   --     Chemistries   Recent Labs Lab 02/03/14 0913 02/04/14 0553 02/05/14 0512 02/06/14 0405 02/07/14 0500  NA 144 142 144 139 137  K 2.9* 4.0 4.1 3.6* 4.5  CL 99 100 99 96 95*  CO2 33* 34* 27 32 34*  GLUCOSE 114* 195* 253* 263* 180*  BUN 15 13 13 13 9   CREATININE 0.71 0.73 0.65 0.74 0.63  CALCIUM 9.7 8.8 9.0 8.7 8.9    ------------------------------------------------------------------------------------------------------------------ estimated creatinine clearance is 71.8 ml/min (by C-G formula based on Cr of 0.63). ------------------------------------------------------------------------------------------------------------------ No results found for this basename: HGBA1C,  in the last 72 hours ------------------------------------------------------------------------------------------------------------------ No results found for this basename: CHOL, HDL, LDLCALC, TRIG, CHOLHDL, LDLDIRECT,  in the last 72 hours ------------------------------------------------------------------------------------------------------------------ No results found for this basename: TSH, T4TOTAL, FREET3, T3FREE, THYROIDAB,  in the last 72 hours ------------------------------------------------------------------------------------------------------------------ No results found for this basename: VITAMINB12, FOLATE, FERRITIN, TIBC, IRON, RETICCTPCT,  in the last 72 hours  Coagulation profile  Recent Labs Lab 02/03/14 0913 02/04/14 1942  INR 1.03 1.08    No results found for this basename: DDIMER,  in the last 72 hours  Cardiac Enzymes  Recent Labs Lab 02/03/14 0913  TROPONINI <0.30   ------------------------------------------------------------------------------------------------------------------ No components found with this basename: POCBNP,      Time Spent in minutes  35   [name] K M.D on 02/07/2014 at 9:33 AM  Between 7am to 7pm - Pager - [phone]  After 7pm go to [email or web address] - password [email or web address]  And look for the night coverage person covering for me after hours  [facility] Office  [phone]   **Disclaimer: This note may have been dictated with voice recognition software. Similar sounding words can inadvertently be transcribed and this note may contain transcription errors which  may not have been corrected upon publication of note.**

## 2014-02-08 ENCOUNTER — Encounter (HOSPITAL_COMMUNITY): Payer: Medicare HMO | Admitting: Anesthesiology

## 2014-02-08 ENCOUNTER — Inpatient Hospital Stay (HOSPITAL_COMMUNITY): Payer: Medicare HMO | Admitting: Anesthesiology

## 2014-02-08 ENCOUNTER — Encounter (HOSPITAL_COMMUNITY)
Admission: EM | Disposition: A | Discharge status: Skilled Nursing Facility | Payer: Self-pay | Source: Home / Self Care | Attending: Internal Medicine

## 2014-02-08 HISTORY — PX: ORIF TOE FRACTURE: SHX5032

## 2014-02-08 LAB — CBC
HCT: 37.7 % (ref 36.0–46.0)
Hemoglobin: 11.5 g/dL — ABNORMAL LOW (ref 12.0–15.0)
MCH: 26 pg (ref 26.0–34.0)
MCHC: 30.5 g/dL (ref 30.0–36.0)
MCV: 85.3 fL (ref 78.0–100.0)
Platelets: 174 10*3/uL (ref 150–400)
RBC: 4.42 MIL/uL (ref 3.87–5.11)
RDW: 16 % — ABNORMAL HIGH (ref 11.5–15.5)
WBC: 10 10*3/uL (ref 4.0–10.5)

## 2014-02-08 LAB — URINE MICROSCOPIC-ADD ON

## 2014-02-08 LAB — GLUCOSE, CAPILLARY
GLUCOSE-CAPILLARY: 146 mg/dL — AB (ref 70–99)
GLUCOSE-CAPILLARY: 239 mg/dL — AB (ref 70–99)
GLUCOSE-CAPILLARY: 209 mg/dL — AB (ref 70–99)
Glucose-Capillary: 170 mg/dL — ABNORMAL HIGH (ref 70–99)
Glucose-Capillary: 178 mg/dL — ABNORMAL HIGH (ref 70–99)
Glucose-Capillary: 256 mg/dL — ABNORMAL HIGH (ref 70–99)

## 2014-02-08 LAB — URINALYSIS, ROUTINE W REFLEX MICROSCOPIC
Bilirubin Urine: NEGATIVE
Glucose, UA: NEGATIVE mg/dL
Hgb urine dipstick: NEGATIVE
Ketones, ur: NEGATIVE mg/dL
Nitrite: NEGATIVE
Protein, ur: NEGATIVE mg/dL
Specific Gravity, Urine: 1.007 (ref 1.005–1.030)
UROBILINOGEN UA: 1 mg/dL (ref 0.0–1.0)
pH: 7 (ref 5.0–8.0)

## 2014-02-08 LAB — BASIC METABOLIC PANEL
ANION GAP: 8 (ref 5–15)
BUN: 9 mg/dL (ref 6–23)
CHLORIDE: 97 meq/L (ref 96–112)
CO2: 36 mEq/L — ABNORMAL HIGH (ref 19–32)
CREATININE: 0.56 mg/dL (ref 0.50–1.10)
Calcium: 8.9 mg/dL (ref 8.4–10.5)
GFR calc non Af Amer: >90 mL/min (ref >90)
Glucose, Bld: 154 mg/dL — ABNORMAL HIGH (ref 70–99)
POTASSIUM: 4.2 meq/L (ref 3.7–5.3)
Sodium: 141 mEq/L (ref 137–147)

## 2014-02-08 SURGERY — OPEN REDUCTION INTERNAL FIXATION (ORIF) METATARSAL (TOE) FRACTURE
Anesthesia: General | Site: Foot | Laterality: Right

## 2014-02-08 MED ORDER — LIDOCAINE HCL (CARDIAC) 20 MG/ML IV SOLN
INTRAVENOUS | Status: DC | PRN
  Administered 2014-02-08: 70 mg via INTRAVENOUS

## 2014-02-08 MED ORDER — LIDOCAINE HCL (CARDIAC) 20 MG/ML IV SOLN
INTRAVENOUS | Status: AC
  Filled 2014-02-08: qty 5

## 2014-02-08 MED ORDER — ONDANSETRON HCL 4 MG/2ML IJ SOLN
INTRAMUSCULAR | Status: AC
  Filled 2014-02-08: qty 2

## 2014-02-08 MED ORDER — PROMETHAZINE HCL 25 MG/ML IJ SOLN
6.2500 mg | INTRAMUSCULAR | Status: DC | PRN
  Administered 2014-02-08: 6.25 mg via INTRAVENOUS

## 2014-02-08 MED ORDER — PROPOFOL 10 MG/ML IV BOLUS
INTRAVENOUS | Status: AC
  Filled 2014-02-08: qty 20

## 2014-02-08 MED ORDER — PROPOFOL 10 MG/ML IV BOLUS
INTRAVENOUS | Status: DC | PRN
  Administered 2014-02-08: 150 mg via INTRAVENOUS

## 2014-02-08 MED ORDER — FENTANYL CITRATE 0.05 MG/ML IJ SOLN
INTRAMUSCULAR | Status: DC | PRN
  Administered 2014-02-08: 100 ug via INTRAVENOUS

## 2014-02-08 MED ORDER — DEXTROSE-NACL 5-0.45 % IV SOLN
INTRAVENOUS | Status: DC
Start: 2014-02-08 — End: 2014-02-08
  Administered 2014-02-08: 09:00:00 via INTRAVENOUS

## 2014-02-08 MED ORDER — FENTANYL CITRATE 0.05 MG/ML IJ SOLN
INTRAMUSCULAR | Status: AC
  Filled 2014-02-08: qty 5

## 2014-02-08 MED ORDER — ENOXAPARIN SODIUM 40 MG/0.4ML ~~LOC~~ SOLN
40.0000 mg | SUBCUTANEOUS | Status: DC
  Administered 2014-02-09 – 2014-02-10 (×2): 40 mg via SUBCUTANEOUS
  Filled 2014-02-08 (×3): qty 0.4

## 2014-02-08 MED ORDER — INSULIN DETEMIR 100 UNIT/ML ~~LOC~~ SOLN
40.0000 [IU] | Freq: Two times a day (BID) | SUBCUTANEOUS | Status: DC
  Administered 2014-02-08 – 2014-02-10 (×4): 40 [IU] via SUBCUTANEOUS
  Filled 2014-02-08 (×5): qty 0.4

## 2014-02-08 MED ORDER — 0.9 % SODIUM CHLORIDE (POUR BTL) OPTIME
TOPICAL | Status: DC | PRN
  Administered 2014-02-08: 1000 mL

## 2014-02-08 MED ORDER — ONDANSETRON HCL 4 MG/2ML IJ SOLN
4.0000 mg | Freq: Four times a day (QID) | INTRAMUSCULAR | Status: DC | PRN
  Administered 2014-02-08: 4 mg via INTRAVENOUS
  Filled 2014-02-08: qty 2

## 2014-02-08 MED ORDER — MEPERIDINE HCL 25 MG/ML IJ SOLN: 6.2500 mg | INTRAMUSCULAR | Status: DC | PRN

## 2014-02-08 MED ORDER — INSULIN ASPART 100 UNIT/ML ~~LOC~~ SOLN
0.0000 [IU] | SUBCUTANEOUS | Status: DC
  Administered 2014-02-08: 8 [IU] via SUBCUTANEOUS
  Administered 2014-02-08 – 2014-02-09 (×2): 5 [IU] via SUBCUTANEOUS
  Administered 2014-02-09: 3 [IU] via SUBCUTANEOUS
  Administered 2014-02-09: 2 [IU] via SUBCUTANEOUS
  Administered 2014-02-09 (×2): 3 [IU] via SUBCUTANEOUS
  Administered 2014-02-10 (×2): 2 [IU] via SUBCUTANEOUS
  Administered 2014-02-10: 3 [IU] via SUBCUTANEOUS
  Administered 2014-02-10: 2 [IU] via SUBCUTANEOUS

## 2014-02-08 MED ORDER — CLINDAMYCIN PHOSPHATE 900 MG/50ML IV SOLN
INTRAVENOUS | Status: DC | PRN
  Administered 2014-02-08: 900 mg via INTRAVENOUS

## 2014-02-08 MED ORDER — PROMETHAZINE HCL 25 MG/ML IJ SOLN
INTRAMUSCULAR | Status: AC
  Administered 2014-02-08: 6.25 mg via INTRAVENOUS
  Filled 2014-02-08: qty 1

## 2014-02-08 MED ORDER — LACTATED RINGERS IV SOLN
INTRAVENOUS | Status: DC | PRN
  Administered 2014-02-08: 10:00:00 via INTRAVENOUS

## 2014-02-08 MED ORDER — ONDANSETRON HCL 4 MG/2ML IJ SOLN
INTRAMUSCULAR | Status: DC | PRN
  Administered 2014-02-08: 4 mg via INTRAVENOUS

## 2014-02-08 MED ORDER — HYDROMORPHONE HCL PF 1 MG/ML IJ SOLN
INTRAMUSCULAR | Status: AC
  Administered 2014-02-08: 0.5 mg via INTRAVENOUS
  Filled 2014-02-08: qty 1

## 2014-02-08 MED ORDER — BACITRACIN ZINC 500 UNIT/GM EX OINT
TOPICAL_OINTMENT | CUTANEOUS | Status: DC | PRN
  Administered 2014-02-08: 1 via TOPICAL

## 2014-02-08 MED ORDER — HYDRALAZINE HCL 25 MG PO TABS
25.0000 mg | ORAL_TABLET | Freq: Four times a day (QID) | ORAL | Status: DC
  Administered 2014-02-08 – 2014-02-10 (×7): 25 mg via ORAL
  Filled 2014-02-08 (×12): qty 1

## 2014-02-08 MED ORDER — OXYCODONE HCL 5 MG/5ML PO SOLN: 5.0000 mg | Freq: Once | ORAL | Status: DC | PRN

## 2014-02-08 MED ORDER — MIDAZOLAM HCL 2 MG/2ML IJ SOLN
INTRAMUSCULAR | Status: AC
  Filled 2014-02-08: qty 2

## 2014-02-08 MED ORDER — OXYCODONE HCL 5 MG PO TABS: 5.0000 mg | ORAL_TABLET | Freq: Once | ORAL | Status: DC | PRN

## 2014-02-08 MED ORDER — SODIUM CHLORIDE 0.9 % IV SOLN
INTRAVENOUS | Status: AC
  Administered 2014-02-08: 19:00:00 via INTRAVENOUS

## 2014-02-08 MED ORDER — HYDROMORPHONE HCL PF 1 MG/ML IJ SOLN
0.2500 mg | INTRAMUSCULAR | Status: DC | PRN
  Administered 2014-02-08: 0.5 mg via INTRAVENOUS

## 2014-02-08 MED ORDER — ONDANSETRON HCL 4 MG PO TABS: 4.0000 mg | ORAL_TABLET | Freq: Four times a day (QID) | ORAL | Status: DC | PRN

## 2014-02-08 MED ORDER — INSULIN DETEMIR 100 UNIT/ML ~~LOC~~ SOLN
25.0000 [IU] | Freq: Once | SUBCUTANEOUS | Status: DC
  Filled 2014-02-08: qty 0.25

## 2014-02-08 MED ORDER — BACITRACIN ZINC 500 UNIT/GM EX OINT
TOPICAL_OINTMENT | CUTANEOUS | Status: AC
  Filled 2014-02-08: qty 15

## 2014-02-08 MED ORDER — SUCCINYLCHOLINE CHLORIDE 20 MG/ML IJ SOLN
INTRAMUSCULAR | Status: DC | PRN
  Administered 2014-02-08: 120 mg via INTRAVENOUS

## 2014-02-08 SURGICAL SUPPLY — 65 items
BANDAGE ESMARK 6X9 LF (GAUZE/BANDAGES/DRESSINGS) ×1 IMPLANT
BIT DRILL 2 FAST STEP (BIT) ×3 IMPLANT
BIT DRILL 2.5X2.75 QC CALB (BIT) ×3 IMPLANT
BLADE SURG 10 STRL SS (BLADE) ×3 IMPLANT
BNDG COHESIVE 4X5 TAN STRL (GAUZE/BANDAGES/DRESSINGS) ×3 IMPLANT
BNDG COHESIVE 6X5 TAN STRL LF (GAUZE/BANDAGES/DRESSINGS) ×3 IMPLANT
BNDG ESMARK 6X9 LF (GAUZE/BANDAGES/DRESSINGS) ×3
CANISTER SUCT 3000ML (MISCELLANEOUS) ×3 IMPLANT
CHLORAPREP W/TINT 26ML (MISCELLANEOUS) ×3 IMPLANT
COVER SURGICAL LIGHT HANDLE (MISCELLANEOUS) ×3 IMPLANT
CUFF TOURNIQUET SINGLE 34IN LL (TOURNIQUET CUFF) ×3 IMPLANT
CUFF TOURNIQUET SINGLE 44IN (TOURNIQUET CUFF) IMPLANT
DRAPE C-ARM 42X72 X-RAY (DRAPES) ×3 IMPLANT
DRAPE U-SHAPE 47X51 STRL (DRAPES) ×3 IMPLANT
DRSG ADAPTIC 3X8 NADH LF (GAUZE/BANDAGES/DRESSINGS) ×3 IMPLANT
DRSG PAD ABDOMINAL 8X10 ST (GAUZE/BANDAGES/DRESSINGS) ×3 IMPLANT
ELECT REM PT RETURN 9FT ADLT (ELECTROSURGICAL) ×3
ELECTRODE REM PT RTRN 9FT ADLT (ELECTROSURGICAL) ×1 IMPLANT
GLOVE BIO SURGEON STRL SZ7 (GLOVE) ×3 IMPLANT
GLOVE BIO SURGEON STRL SZ8 (GLOVE) ×3 IMPLANT
GLOVE BIOGEL PI IND STRL 7.5 (GLOVE) ×1 IMPLANT
GLOVE BIOGEL PI IND STRL 8 (GLOVE) ×1 IMPLANT
GLOVE BIOGEL PI INDICATOR 7.5 (GLOVE) ×2
GLOVE BIOGEL PI INDICATOR 8 (GLOVE) ×2
GOWN STRL REUS W/ TWL LRG LVL3 (GOWN DISPOSABLE) ×2 IMPLANT
GOWN STRL REUS W/ TWL XL LVL3 (GOWN DISPOSABLE) ×1 IMPLANT
GOWN STRL REUS W/TWL LRG LVL3 (GOWN DISPOSABLE) ×4
GOWN STRL REUS W/TWL XL LVL3 (GOWN DISPOSABLE) ×2
K-WIRE ACE 1.6X6 (WIRE) ×9
KIT BASIN OR (CUSTOM PROCEDURE TRAY) ×3 IMPLANT
KIT ROOM TURNOVER OR (KITS) ×3 IMPLANT
KWIRE ACE 1.6X6 (WIRE) ×3 IMPLANT
NEEDLE 22X1 1/2 (OR ONLY) (NEEDLE) IMPLANT
NS IRRIG 1000ML POUR BTL (IV SOLUTION) ×3 IMPLANT
PACK ORTHO EXTREMITY (CUSTOM PROCEDURE TRAY) ×3 IMPLANT
PAD ARMBOARD 7.5X6 YLW CONV (MISCELLANEOUS) ×6 IMPLANT
PAD CAST 4YDX4 CTTN HI CHSV (CAST SUPPLIES) ×1 IMPLANT
PADDING CAST COTTON 4X4 STRL (CAST SUPPLIES) ×2
PLATE LOCK 1ST RT MTP (Plate) ×3 IMPLANT
SCREW ACE CAN 4.0 40M (Screw) ×3 IMPLANT
SCREW PEG 2.5X12 NONLOCK (Screw) ×3 IMPLANT
SCREW PEG 2.5X14 NONLOCK (Screw) ×3 IMPLANT
SCREW PEG LOCK 2.5X12 (Screw) ×6 IMPLANT
SCREW PEG LOCK 2.5X18 (Peg) ×3 IMPLANT
SCREW PEG LOCK 2.5X20 (Peg) ×3 IMPLANT
SOAP 2 % CHG 4 OZ (WOUND CARE) ×6 IMPLANT
SPLINT PLASTER CAST XFAST 5X30 (CAST SUPPLIES) ×1 IMPLANT
SPLINT PLASTER XFAST SET 5X30 (CAST SUPPLIES) ×2
SPONGE GAUZE 4X4 12PLY (GAUZE/BANDAGES/DRESSINGS) IMPLANT
SPONGE GAUZE 4X4 12PLY STER LF (GAUZE/BANDAGES/DRESSINGS) ×3 IMPLANT
STAPLER VISISTAT 35W (STAPLE) IMPLANT
SUCTION FRAZIER TIP 10 FR DISP (SUCTIONS) ×3 IMPLANT
SUT ETHILON 3 0 PS 1 (SUTURE) ×3 IMPLANT
SUT MNCRL AB 3-0 PS2 18 (SUTURE) ×3 IMPLANT
SUT PROLENE 3 0 PS 2 (SUTURE) ×3 IMPLANT
SUT VIC AB 2-0 CT1 27 (SUTURE) ×2
SUT VIC AB 2-0 CT1 TAPERPNT 27 (SUTURE) ×1 IMPLANT
SUT VIC AB 3-0 PS2 18 (SUTURE) ×2
SUT VIC AB 3-0 PS2 18XBRD (SUTURE) ×1 IMPLANT
SYR CONTROL 10ML LL (SYRINGE) IMPLANT
TOWEL OR 17X24 6PK STRL BLUE (TOWEL DISPOSABLE) ×3 IMPLANT
TOWEL OR 17X26 10 PK STRL BLUE (TOWEL DISPOSABLE) ×3 IMPLANT
TUBE CONNECTING 12'X1/4 (SUCTIONS) ×1
TUBE CONNECTING 12X1/4 (SUCTIONS) ×2 IMPLANT
WATER STERILE IRR 1000ML POUR (IV SOLUTION) ×3 IMPLANT

## 2014-02-08 NOTE — Progress Notes (Signed)
Patient Demographics  [name], is a 73 y.o. female, DOB - May 11, 1941, GUY:[id number]  Admit date - 02/03/2014   Admitting Physician Mauri Pole, MD  Outpatient Primary MD for the patient is PROVIDER NOT IN SYSTEM  LOS - 5   Chief Complaint  Patient presents with  . Fall  . Hip Injury         Subjective:   [name] today has, No headache, No chest pain, No abdominal pain - No Nausea, No new weakness tingling or numbness, No Cough - SOB. Feels better less confused today, is in good spirits.     Assessment & Plan    1. Mech fall with left intertrochanteric fracture, right distal fibula and first metatarsophalangeal joint fracture.     Left intertrochanteric fracture - ORIF by Dr. Paralee Cancel on 02/04/2014    R ankle nondisplaced fracture - per orthopedics can be managed safely in closed fashion. She will likely be PWB for transfers on the R LE post op.    R hallux MPJ fracture / dislocation - to OR for ORIF 02-08-14 - [facility]    Weightbearing status is partial weight bearing on the left, no weight bearing on the right currently.    DVT prophylaxis Lovenox received last dose 02/07/2014 a.m. then stopped for possible surgery on 02/08/2014 by orthopedics, defer weightbearing and DVT prophylaxis to orthopedics at this point.       2. Mild aortic stenosis noted on echogram done in regional with nonspecific EKG changes. Seen by cardiologist Dr. Doylene Canard, will place her on her home dose aspirin 81 mg from today, no acute issues. Chest pain-free we'll follow with Dr. Doylene Canard postdischarge.     3. Delirium. Minimize narcotics and benzodiazepines, QTC is stable, as needed Haldol. Fall and aspiration precautions. Family educated bedside. They are okay with restraints as needed.  Delirium has improved.     4. Hypertension. Currently on Norvasc, add Coreg for better control. On when necessary hydralazine.    5. DM type II. Outpatient control poor, A1c greater than 11. We'll place her on split dose Levimir increased to 40 units twice a day, continue sliding scale insulin.   Awaiting or on 02/08/2014, since n.p.o. currently placed on low-dose D5 half normal, Levimir held for now. Every 2 hours CBGs.  CBG (last 3)   Recent Labs  02/07/14 1638 02/07/14 2227 02/08/14 0654  GLUCAP 219* 169* 146*       5. Dyslipidemia. Home dose statin continued.     6. Underlying chronic diastolic CHF with mild acute decompensation as evident on chest x-ray and physical exam. Resolved after Low-dose Lasix, place on home dose HCTZ, continue fluid and salt restriction, monitor clinically. EF is 60% with mild aortic stenosis on echogram.     7. Lower extremity edema. Due to mild acute on chronic diastolic CHF, diuresed with Lasix, now placed on home dose HCTZ, lower extremity venous duplex unremarkable.      8. Mild leukocytosis. due to UTI placed on Levaquin for 3 days pharmacy to dose. Echo cytosis resolved.     9.GERD - On PPI     10. Diabetic neuropathy. On Neurontin at a lower than home dose due to confusion, stable.  11.Low K - replaced and stable      Code Status: Full  Family Communication: Husband and daughter bedside 02/05/2014, Daughter 02/06/2014, husband 02/07/2014, son bedside 02/08/2014  Disposition Plan: Likely SNF    Procedures     ORIF L Hip 02-04-14 Dr Alvan Dame   R hallux MPJ fracture / dislocation - to OR for ORIF 02-08-14 - [facility]     Echo    Impressions:  - Moderate to severe LVH with LVEF 80-16%, grade 1 diastolic dysfunction. Severe left atrial enlargement. 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 Unable to assess PASP    Consults  Ortho,  Cards   Medications  Scheduled Meds: . amitriptyline  10 mg Oral QHS  . amLODipine  5 mg Oral Daily  . atorvastatin  20 mg Oral QHS  . bisacodyl  10 mg Rectal Daily  . carvedilol  3.125 mg Oral BID WC  . chlorhexidine  60 mL Topical Once  . clindamycin (CLEOCIN) IV  900 mg Intravenous On Call to OR  . docusate sodium  200 mg Oral BID  . fluticasone  1 spray Each Nare Daily  . gabapentin  100 mg Oral BID  . hydrALAZINE  25 mg Oral QID  . hydrochlorothiazide  25 mg Oral Daily  . hydrocortisone  1 application Topical QID  . insulin aspart  0-15 Units Subcutaneous Q4H  . insulin aspart  0-5 Units Subcutaneous QHS  . insulin detemir  25 Units Subcutaneous Once  . insulin detemir  40 Units Subcutaneous BID  . levofloxacin  500 mg Oral Daily  . pantoprazole  40 mg Oral Daily  . polyethylene glycol  17 g Oral BID   Continuous Infusions: . sodium chloride    . dextrose 5 % and 0.45% NaCl     PRN Meds:.acetaminophen, alum & mag hydroxide-simeth, haloperidol lactate, HYDROmorphone (DILAUDID) injection, ondansetron (ZOFRAN) IV, oxyCODONE, zolpidem    DVT Prophylaxis  Lovenox  - SCDs   Lab Results  Component Value Date   PLT 174 02/08/2014      Antibiotics     Anti-infectives   Start     Dose/Rate Route Frequency Ordered Stop   02/08/14 0600  clindamycin (CLEOCIN) IVPB 900 mg     900 mg 100 mL/hr over 30 Minutes Intravenous On call to O.R. 02/07/14 1408 02/09/14 0559   02/06/14 1000  levofloxacin (LEVAQUIN) tablet 500 mg    Comments:  Pharmacy can adjust for UTI   500 mg Oral Daily 02/06/14 0922 02/09/14 0959   02/04/14 2116  ceFAZolin (ANCEF) 2-3 GM-% IVPB SOLR    Comments:  [name]   : cabinet override      02/04/14 2116 02/05/14 0929   02/04/14 0600  vancomycin (VANCOCIN) IVPB 1000 mg/200 mL premix  Status:  Discontinued     1,000 mg 200 mL/hr over 60 Minutes Intravenous On call to O.R. 02/03/14 1751 02/04/14 2304          Objective:   Filed Vitals:    02/06/14 2150 02/07/14 0636 02/07/14 2225 02/08/14 0655  BP: 149/58 154/56 155/60 166/57  Pulse: 85 80 79 82  Temp: 97.9 F (36.6 C) 97.9 F (36.6 C) 98 F (36.7 C) 98 F (36.7 C)  TempSrc: Oral Oral Oral Oral  Resp: 16 16 16 16   Height:      Weight:      SpO2: 98% 99% 99% 96%    Wt Readings from Last 3 Encounters:  02/04/14 90.719 kg (  200 lb)  02/04/14 90.719 kg (200 lb)  02/04/14 90.719 kg (200 lb)     Intake/Output Summary (Last 24 hours) at 02/08/14 0852 Last data filed at 02/08/14 0700  Gross per 24 hour  Intake 538.75 ml  Output      1 ml  Net 537.75 ml     Physical Exam  Awake but pleasantly confused, No new F.N deficits, Normal affect [place].AT,PERRAL Supple Neck,No JVD, No cervical lymphadenopathy appriciated.  Symmetrical Chest wall movement, Good air movement bilaterally, CTAB RRR,No Gallops,Rubs or new Murmurs, No Parasternal Heave +ve B.Sounds, Abd Soft, No tenderness, No organomegaly appriciated, No rebound - guarding or rigidity. No Cyanosis, Clubbing , No new Rash or bruise, R.Leg in ACE wrap, no edema     Data Review   Micro Results Recent Results (from the past 240 hour(s))  MRSA PCR SCREENING     Status: None   Collection Time    02/03/14  7:03 PM      Result Value Ref Range Status   MRSA by PCR NEGATIVE  NEGATIVE Final   Comment:            The GeneXpert MRSA Assay (FDA     approved for NASAL specimens     only), is one component of a     comprehensive MRSA colonization     surveillance program. It is not     intended to diagnose MRSA     infection nor to guide or     monitor treatment for     MRSA infections.    Radiology Reports Dg Chest 1 View  02/03/2014   CLINICAL DATA:  Fall.  Hip injury  EXAM: CHEST - 1 VIEW  COMPARISON:  None.  FINDINGS: Cardiac enlargement without heart failure. Lungs are clear. No displaced rib fracture.  IMPRESSION: Cardiac enlargement.  No active cardiopulmonary disease.   Electronically Signed   By:  Franchot Gallo M.D.   On: 02/03/2014 10:54   Dg Hip Complete Left  02/03/2014   CLINICAL DATA:  Fall  EXAM: LEFT HIP - COMPLETE 2+ VIEW  COMPARISON:  None.  FINDINGS: Sub trochanteric fracture on the left with angulation and medial displacement. Left hip joint appears normal. No other fracture.  IMPRESSION: Displaced subtrochanteric fracture left femur   Electronically Signed   By: Franchot Gallo M.D.   On: 02/03/2014 10:55   Dg Femur Left  02/04/2014   CLINICAL DATA:  Intra medullary nail fixation  EXAM: LEFT FEMUR - 2 VIEW  COMPARISON:  02/03/2014  FINDINGS: Status post open reduction and internal fixation of a subtrochanteric left femur fracture. There is no evidence intra procedural complication. The hip is located.  IMPRESSION: Proximal left femur fracture ORIF.  No adverse findings.   Electronically Signed   By: Jorje Guild M.D.   On: 02/04/2014 23:47   Dg Ankle Complete Right  02/03/2014   CLINICAL DATA:  Fall  EXAM: RIGHT ANKLE - COMPLETE 3+ VIEW  COMPARISON:  None.  FINDINGS: Oblique nondisplaced fracture of the distal fibula. No fracture of the tibia. Ankle mortise intact.  Arterial calcification.  Calcaneal spurring.  IMPRESSION: Nondisplaced fracture distal fibula.   Electronically Signed   By: Franchot Gallo M.D.   On: 02/03/2014 10:59   Ct Head Wo Contrast  02/03/2014   CLINICAL DATA:  Fall  EXAM: CT HEAD WITHOUT CONTRAST  TECHNIQUE: Contiguous axial images were obtained from the base of the skull through the vertex without intravenous contrast.  COMPARISON:  None.  FINDINGS: No skull fracture is noted. The mastoid air cells are unremarkable. There is mucosal thickening with partial opacification left ethmoid air cells.  Mild cerebral atrophy. Mild periventricular white matter decreased attenuation probable due to chronic small vessel ischemic changes. No acute cortical infarction. No mass lesion is noted on this unenhanced scan.  IMPRESSION: No acute intracranial abnormality. Mild cerebral  atrophy. Periventricular white matter decreased attenuation probable due to chronic small vessel ischemic changes.   Electronically Signed   By: Lahoma Crocker M.D.   On: 02/03/2014 11:13   Dg Chest Port 1 View  02/05/2014   CLINICAL DATA:  Cough.  Shortness of breath.  But fracture.  EXAM: PORTABLE CHEST - 1 VIEW  COMPARISON:  One-view chest 02/03/2014.  FINDINGS: The heart is enlarged. Moderate pulmonary vascular congestion is now present. Mild atelectasis is evident at the bases. No significant airspace consolidation is present. The visualized soft tissues and bony thorax are unremarkable.  IMPRESSION: 1. Stable cardiomegaly with new moderate pulmonary vascular congestion. Early failure is considered. 2. Mild bibasilar atelectasis without other significant airspace disease.   Electronically Signed   By: Lawrence Santiago M.D.   On: 02/05/2014 08:05   Dg Knee Complete 4 Views Left  02/03/2014   CLINICAL DATA:  Fall  EXAM: LEFT KNEE - COMPLETE 4+ VIEW  COMPARISON:  None.  FINDINGS: There is no evidence of fracture, dislocation, or joint effusion. There is no evidence of arthropathy or other focal bone abnormality. Soft tissues are unremarkable.  IMPRESSION: Negative.   Electronically Signed   By: Franchot Gallo M.D.   On: 02/03/2014 10:58   Dg Ankle Right Port  02/05/2014   CLINICAL DATA:  Right fibula fracture.  EXAM: PORTABLE RIGHT ANKLE - 2 VIEW  COMPARISON:  02/03/2014.  FINDINGS: A nondisplaced oblique fracture of the distal fibula is again demonstrated. No displacement or angulation. Interval fiberglass cast.  IMPRESSION: Anatomic position and alignment of the previously demonstrated distal fibular fracture with an interval cast.   Electronically Signed   By: Enrique Sack M.D.   On: 02/05/2014 08:55   Dg Foot 2 Views Right  02/05/2014   CLINICAL DATA:  Followup right first MTP fracture/dislocation.  EXAM: RIGHT FOOT - 2 VIEW  COMPARISON:  02/03/2014.  FINDINGS: Interval fiberglass cast extending to the level  of the proximal first phalanx. There is continued dorsal and proximal dislocation of the first proximal phalanx relative to the first metatarsal with an associated mildly comminuted fracture of the first metatarsal head and displacement of two of the first metatarsal head fragments in conjunction with the proximal phalanx.  IMPRESSION: Stable fracture/dislocation at the first MTP joint, as described above   Electronically Signed   By: Enrique Sack M.D.   On: 02/05/2014 08:58   Dg Foot 2 Views Right  02/03/2014   CLINICAL DATA:  Postreduction.  EXAM: RIGHT FOOT - 2 VIEW  COMPARISON:  Right foot radiograph February 03, 2014 at 1018 hr.  FINDINGS: Suspected residual subluxation without frank dislocation of first metatarsophalangeal fracture dislocation seen on prior radiograph. Dorsal foot soft tissue swelling without subcutaneous gas radiopaque foreign bodies. Moderate vascular calcifications.  IMPRESSION: Improved alignment of first metatarsophalangeal fracture dislocation.   Electronically Signed   By: Elon Alas   On: 02/03/2014 12:33   Dg Foot Complete Right  02/03/2014   CLINICAL DATA:  Fall  EXAM: RIGHT FOOT COMPLETE - 3+ VIEW  COMPARISON:  None.  FINDINGS: Dorsal dislocation of the first metatarsophalangeal  joint. Possible small avulsion fracture.  No other fracture.  Diffuse arterial calcification.  IMPRESSION: Dislocation of the first MTP   Electronically Signed   By: Franchot Gallo M.D.   On: 02/03/2014 10:57   Dg C-arm 61-120 Min-no Report  02/04/2014   CLINICAL DATA: intraop   C-ARM 61-120 MINUTES  Fluoroscopy was utilized by the requesting physician.  No radiographic  interpretation.     CBC  Recent Labs Lab 02/03/14 0913 02/04/14 0553 02/05/14 0512 02/06/14 0405 02/07/14 0500 02/08/14 0548  WBC 14.2* 11.9* 14.7* 12.5* 12.1* 10.0  HGB 14.2 13.2 12.9 11.5* 11.9* 11.5*  HCT 44.0 42.7 41.4 36.4 38.3 37.7  PLT 195 189 159 180 138* 174  MCV 81.3 83.7 84.1 82.9 84.2 85.3  MCH 26.2 25.9*  26.2 26.2 26.2 26.0  MCHC 32.3 30.9 31.2 31.6 31.1 30.5  RDW 15.7* 16.0* 16.3* 16.4* 16.3* 16.0*  LYMPHSABS 1.8  --   --   --   --   --   MONOABS 0.7  --   --   --   --   --   EOSABS 0.5  --   --   --   --   --   BASOSABS 0.1  --   --   --   --   --     Chemistries   Recent Labs Lab 02/04/14 0553 02/05/14 0512 02/06/14 0405 02/07/14 0500 02/08/14 0548  NA 142 144 139 137 141  K 4.0 4.1 3.6* 4.5 4.2  CL 100 99 96 95* 97  CO2 34* 27 32 34* 36*  GLUCOSE 195* 253* 263* 180* 154*  BUN 13 13 13 9 9   CREATININE 0.73 0.65 0.74 0.63 0.56  CALCIUM 8.8 9.0 8.7 8.9 8.9   ------------------------------------------------------------------------------------------------------------------ estimated creatinine clearance is 71.8 ml/min (by C-G formula based on Cr of 0.56). ------------------------------------------------------------------------------------------------------------------ No results found for this basename: HGBA1C,  in the last 72 hours ------------------------------------------------------------------------------------------------------------------ No results found for this basename: CHOL, HDL, LDLCALC, TRIG, CHOLHDL, LDLDIRECT,  in the last 72 hours ------------------------------------------------------------------------------------------------------------------ No results found for this basename: TSH, T4TOTAL, FREET3, T3FREE, THYROIDAB,  in the last 72 hours ------------------------------------------------------------------------------------------------------------------ No results found for this basename: VITAMINB12, FOLATE, FERRITIN, TIBC, IRON, RETICCTPCT,  in the last 72 hours  Coagulation profile  Recent Labs Lab 02/03/14 0913 02/04/14 1942  INR 1.03 1.08    No results found for this basename: DDIMER,  in the last 72 hours  Cardiac Enzymes  Recent Labs Lab 02/03/14 0913  TROPONINI <0.30    ------------------------------------------------------------------------------------------------------------------ No components found with this basename: POCBNP,      Time Spent in minutes  35   Derrius Furtick K M.D on 02/08/2014 at 8:52 AM  Between 7am to 7pm - Pager - [phone]  After 7pm go to [email or web address] - password [email or web address]  And look for the night coverage person covering for me after hours  [facility] Office  [phone]   **Disclaimer: This note may have been dictated with voice recognition software. Similar sounding words can inadvertently be transcribed and this note may contain transcription errors which may not have been corrected upon publication of note.**

## 2014-02-08 NOTE — Anesthesia Procedure Notes (Signed)
Procedure Name: Intubation Date/Time: 02/08/2014 10:18 AM Performed by: Eligha Bridegroom Pre-anesthesia Checklist: Patient identified, Emergency Drugs available, Timeout performed, Suction available and Patient being monitored Patient Re-evaluated:Patient Re-evaluated prior to inductionOxygen Delivery Method: Circle system utilized Preoxygenation: Pre-oxygenation with 100% oxygen Intubation Type: IV induction and Rapid sequence Laryngoscope Size: Mac and 4 Grade View: Grade I Tube type: Oral Tube size: 7.0 mm Number of attempts: 1 Airway Equipment and Method: Stylet Placement Confirmation: ETT inserted through vocal cords under direct vision,  breath sounds checked- equal and bilateral and positive ETCO2 Secured at: 21 cm Tube secured with: Tape Dental Injury: Teeth and Oropharynx as per pre-operative assessment

## 2014-02-08 NOTE — Brief Op Note (Signed)
02/03/2014 - 02/08/2014  11:29 AM  PATIENT:  [name]  73 y.o. female  PRE-OPERATIVE DIAGNOSIS:  right hallux MPTJ fracture dislocation  POST-OPERATIVE DIAGNOSIS:  same  Procedure(s): 1.  Open reduction of right hallux MTPJ fracture dislocation 2.  Right hallux MTPJ arthrodesis 3.  AP and lateral radiographs of the right foot   SURGEON:  Wylene Simmer, MD  ASSISTANT: n/a  ANESTHESIA:   General  EBL:  minimal   TOURNIQUET:   Total Tourniquet Time Documented: Thigh (Right) - 49 minutes Total: Thigh (Right) - 49 minutes   COMPLICATIONS:  None apparent  DISPOSITION:  Extubated, awake and stable to recovery.  DICTATION ID:  [id number]

## 2014-02-08 NOTE — Interval H&P Note (Signed)
History and Physical Interval Note:  02/08/2014 9:51 AM  [name]  has presented today for surgery, with the diagnosis of right great toe fracture dislocation  The various methods of treatment have been discussed with the patient and family. After consideration of risks, benefits and other options for treatment, the patient has consented to  Procedure(s): OPEN REDUCTION INTERNAL FIXATION (ORIF) METATARSAL (TOE) FRACTURE vs CLOSED REDUCTION (Right) as a surgical intervention .  The patient's history has been reviewed, patient examined, no change in status, stable for surgery.  I have reviewed the patient's chart and labs.  Questions were answered to the patient's satisfaction.    The risks and benefits of the alternative treatment options have been discussed in detail.  The patient wishes to proceed with surgery and specifically understands risks of bleeding, infection, nerve damage, blood clots, need for additional surgery, amputation and death.   [name]

## 2014-02-08 NOTE — Progress Notes (Signed)
Ref: PROVIDER NOT IN SYSTEM   Subjective:  Increased pain as anesthesia is wearing off post right great toe surgery.   Objective:  Vital Signs in the last 24 hours: Temp:  [97.6 F (36.4 C)-98.2 F (36.8 C)] 98 F (36.7 C) (07/07 2040) Pulse Rate:  [71-87] 71 (07/07 2040) Cardiac Rhythm:  [-] Normal sinus rhythm (07/07 1245) Resp:  [15-18] 18 (07/07 2040) BP: (137-168)/(45-73) 150/45 mmHg (07/07 2040) SpO2:  [92 %-100 %] 92 % (07/07 2040)  Physical Exam: BP Readings from Last 1 Encounters:  02/08/14 150/45     Wt Readings from Last 1 Encounters:  02/04/14 90.719 kg (200 lb)    Weight change:   HEENT: [place]/AT, Eyes-Brown, [name], EOMI, Conjunctiva-Pink, Sclera-Non-icteric Neck: No JVD, No bruit, Trachea midline. Lungs:  Clear, Bilateral. Cardiac:  Regular rhythm, normal S1 and S2, no S3. III/VI systolic murmur. Abdomen:  Soft, non-tender. Extremities:  Mild edema present. No cyanosis. No clubbing. Right lower leg and foot in cast. CNS: AxOx3, Cranial nerves grossly intact, moves all 4 extremities. Right handed. Skin: Warm and dry.   Intake/Output from previous day: 07/06 0701 - 07/07 0700 In: 538.8 [I.V.:538.8] Out: 1 [Stool:1]    Lab Results: BMET    Component Value Date/Time   NA 141 02/08/2014 0548   NA 137 02/07/2014 0500   NA 139 02/06/2014 0405   K 4.2 02/08/2014 0548   K 4.5 02/07/2014 0500   K 3.6* 02/06/2014 0405   CL 97 02/08/2014 0548   CL 95* 02/07/2014 0500   CL 96 02/06/2014 0405   CO2 36* 02/08/2014 0548   CO2 34* 02/07/2014 0500   CO2 32 02/06/2014 0405   GLUCOSE 154* 02/08/2014 0548   GLUCOSE 180* 02/07/2014 0500   GLUCOSE 263* 02/06/2014 0405   BUN 9 02/08/2014 0548   BUN 9 02/07/2014 0500   BUN 13 02/06/2014 0405   CREATININE 0.56 02/08/2014 0548   CREATININE 0.63 02/07/2014 0500   CREATININE 0.74 02/06/2014 0405   CALCIUM 8.9 02/08/2014 0548   CALCIUM 8.9 02/07/2014 0500   CALCIUM 8.7 02/06/2014 0405   GFRNONAA >90 02/08/2014 0548   GFRNONAA 88* 02/07/2014 0500   GFRNONAA 84*  02/06/2014 0405   GFRAA >90 02/08/2014 0548   GFRAA >90 02/07/2014 0500   GFRAA >90 02/06/2014 0405   CBC    Component Value Date/Time   WBC 10.0 02/08/2014 0548   RBC 4.42 02/08/2014 0548   HGB 11.5* 02/08/2014 0548   HCT 37.7 02/08/2014 0548   PLT 174 02/08/2014 0548   MCV 85.3 02/08/2014 0548   MCH 26.0 02/08/2014 0548   MCHC 30.5 02/08/2014 0548   RDW 16.0* 02/08/2014 0548   LYMPHSABS 1.8 02/03/2014 0913   MONOABS 0.7 02/03/2014 0913   EOSABS 0.5 02/03/2014 0913   BASOSABS 0.1 02/03/2014 0913   HEPATIC Function Panel No results found for this basename: PROT,  ALBUMIN,  AST,  ALT,  ALKPHOS,  BILIDIR,  IBILI,  in the last 8760 hours HEMOGLOBIN A1C No components found with this basename: HGA1C,  MPG   CARDIAC ENZYMES Lab Results  Component Value Date   TROPONINI <0.30 02/03/2014   BNP No results found for this basename: PROBNP,  in the last 8760 hours TSH No results found for this basename: TSH,  in the last 8760 hours CHOLESTEROL No results found for this basename: CHOL,  in the last 8760 hours  Scheduled Meds: . amitriptyline  10 mg Oral QHS  . amLODipine  5 mg Oral  Daily  . atorvastatin  20 mg Oral QHS  . bisacodyl  10 mg Rectal Daily  . carvedilol  3.125 mg Oral BID WC  . docusate sodium  200 mg Oral BID  . [START ON 02/09/2014] enoxaparin (LOVENOX) injection  40 mg Subcutaneous Q24H  . fluticasone  1 spray Each Nare Daily  . gabapentin  100 mg Oral BID  . hydrALAZINE  25 mg Oral QID  . hydrochlorothiazide  25 mg Oral Daily  . hydrocortisone  1 application Topical QID  . insulin aspart  0-15 Units Subcutaneous Q4H  . insulin aspart  0-5 Units Subcutaneous QHS  . insulin detemir  25 Units Subcutaneous Once  . insulin detemir  40 Units Subcutaneous BID  . pantoprazole  40 mg Oral Daily  . polyethylene glycol  17 g Oral BID   Continuous Infusions: . sodium chloride 50 mL/hr at 02/08/14 1917   PRN Meds:.acetaminophen, alum & mag hydroxide-simeth, haloperidol lactate, ondansetron (ZOFRAN) IV,  ondansetron, oxyCODONE, zolpidem  Assessment/Plan: Moderate aortic stenosis  Abnormal EKG  LV hypertrophy with repolarization changes or inferior ischemia.  Acute displaced left hip ([place]) subtrochanteric fracture  S/P surgery left hip Acute right first metatarsophalangeal fracture dislocation.  Status post surgery right great toe Acute nondisplaced fracture of the distal right fibula  DM, II, uncontrolled  Hypertension  Morbid obesity  Hypokalemia-Corrected  Confusion- improving   Continue medical treatment   LOS: 5 days    Dixie Dials  MD  02/08/2014, 9:43 PM

## 2014-02-08 NOTE — Progress Notes (Signed)
CSW spoke to patient's family by bedside and explained that [facility] received insurance authorization for SNF placement. CSW continues to follow patient for SNF placement once medically stable.  Jeanette Caprice, MSW, [place]

## 2014-02-08 NOTE — Transfer of Care (Signed)
Immediate Anesthesia Transfer of Care Note  Patient: [name]  Procedure(s) Performed: Procedure(s): OPEN REDUCTION INTERNAL FIXATION Right METATARSAL  FRACTURE  (Right)  Patient Location: PACU  Anesthesia Type:General  Level of Consciousness: awake and alert   Airway & Oxygen Therapy: Patient Spontanous Breathing and Patient connected to nasal cannula oxygen  Post-op Assessment: Report given to PACU RN and Post -op Vital signs reviewed and stable  Post vital signs: Reviewed and stable  Complications: No apparent anesthesia complications

## 2014-02-08 NOTE — Anesthesia Postprocedure Evaluation (Signed)
  Anesthesia Post-op Note  Patient: [name]  Procedure(s) Performed: Procedure(s): OPEN REDUCTION INTERNAL FIXATION Right METATARSAL  FRACTURE  (Right)  Patient Location: PACU  Anesthesia Type:General  Level of Consciousness: awake, alert , oriented and patient cooperative  Airway and Oxygen Therapy: Patient Spontanous Breathing and Patient connected to nasal cannula oxygen  Post-op Pain: none  Post-op Assessment: Post-op Vital signs reviewed, Patient's Cardiovascular Status Stable, Respiratory Function Stable, Patent Airway, No signs of Nausea or vomiting and Pain level controlled  Post-op Vital Signs: Reviewed and stable  Last Vitals:  Filed Vitals:   02/08/14 1321  BP: 168/62  Pulse: 80  Temp: 36.8 C  Resp:     Complications: No apparent anesthesia complications

## 2014-02-08 NOTE — Anesthesia Preprocedure Evaluation (Addendum)
Anesthesia Evaluation  Patient identified by MRN, date of birth, ID band Patient awake    Reviewed: Allergy & Precautions, H&P , NPO status , Patient's Chart, lab work & pertinent test results, reviewed documented beta blocker date and time   History of Anesthesia Complications Negative for: history of anesthetic complications  Airway Mallampati: II TM Distance: >3 FB Neck ROM: Full    Dental  (+) Dental Advisory Given   Pulmonary neg pulmonary ROS,  breath sounds clear to auscultation  Pulmonary exam normal       Cardiovascular hypertension, Pt. on medications and Pt. on home beta blockers + Valvular Problems/Murmurs (AVA 1.24 cm2) AS Rhythm:Regular Rate:Normal + Systolic murmurs 1/10 ECHO: EF 60-65% with grade 1 diastolic dysfunction, mild-mod AS, trivial AI   Neuro/Psych CVA (L weakness, walks with cane), Residual Symptoms    GI/Hepatic GERD-  Medicated and Poorly Controlled,  Endo/Other  diabetes (glu 146), Poorly Controlled, Type 2, Insulin DependentMorbid obesity  Renal/GU negative Renal ROS     Musculoskeletal   Abdominal (+) + obese,   Peds  Hematology   Anesthesia Other Findings   Reproductive/Obstetrics                        Anesthesia Physical Anesthesia Plan  ASA: III  Anesthesia Plan: General   Post-op Pain Management:    Induction: Intravenous  Airway Management Planned: Oral ETT  Additional Equipment:   Intra-op Plan:   Post-operative Plan: Extubation in OR  Informed Consent: I have reviewed the patients History and Physical, chart, labs and discussed the procedure including the risks, benefits and alternatives for the proposed anesthesia with the patient or authorized representative who has indicated his/her understanding and acceptance.   Dental advisory given  Plan Discussed with: Surgeon and CRNA  Anesthesia Plan Comments: (Plan routine monitors, GETA)         Anesthesia Quick Evaluation

## 2014-02-08 NOTE — Progress Notes (Signed)
CSW continues to wait for insurance approval for patient to go to SNF.  Jeanette Caprice, MSW, [place]

## 2014-02-09 LAB — BASIC METABOLIC PANEL
ANION GAP: 10 (ref 5–15)
BUN: 10 mg/dL (ref 6–23)
CALCIUM: 8.9 mg/dL (ref 8.4–10.5)
CO2: 32 meq/L (ref 19–32)
Chloride: 94 mEq/L — ABNORMAL LOW (ref 96–112)
Creatinine, Ser: 0.7 mg/dL (ref 0.50–1.10)
GFR calc Af Amer: >90 mL/min (ref >90)
GFR, EST NON AFRICAN AMERICAN: 85 mL/min — AB (ref >90)
Glucose, Bld: 234 mg/dL — ABNORMAL HIGH (ref 70–99)
Potassium: 4.5 mEq/L (ref 3.7–5.3)
SODIUM: 136 meq/L — AB (ref 137–147)

## 2014-02-09 LAB — CBC
HCT: 35.9 % — ABNORMAL LOW (ref 36.0–46.0)
HEMOGLOBIN: 11.4 g/dL — AB (ref 12.0–15.0)
MCH: 26.5 pg (ref 26.0–34.0)
MCHC: 31.8 g/dL (ref 30.0–36.0)
MCV: 83.3 fL (ref 78.0–100.0)
PLATELETS: 202 10*3/uL (ref 150–400)
RBC: 4.31 MIL/uL (ref 3.87–5.11)
RDW: 16.2 % — ABNORMAL HIGH (ref 11.5–15.5)
WBC: 14.1 10*3/uL — ABNORMAL HIGH (ref 4.0–10.5)

## 2014-02-09 LAB — GLUCOSE, CAPILLARY
GLUCOSE-CAPILLARY: 107 mg/dL — AB (ref 70–99)
GLUCOSE-CAPILLARY: 213 mg/dL — AB (ref 70–99)
Glucose-Capillary: 151 mg/dL — ABNORMAL HIGH (ref 70–99)
Glucose-Capillary: 123 mg/dL — ABNORMAL HIGH (ref 70–99)
Glucose-Capillary: 186 mg/dL — ABNORMAL HIGH (ref 70–99)

## 2014-02-09 LAB — URINE CULTURE
Colony Count: NO GROWTH
Culture: NO GROWTH

## 2014-02-09 MED ORDER — HYDRALAZINE HCL 25 MG PO TABS: 25.0000 mg | ORAL_TABLET | Freq: Four times a day (QID) | ORAL | Status: DC

## 2014-02-09 MED ORDER — POLYETHYLENE GLYCOL 3350 17 G PO PACK: 17.0000 g | PACK | Freq: Two times a day (BID) | ORAL | Status: DC

## 2014-02-09 MED ORDER — INSULIN DETEMIR 100 UNIT/ML ~~LOC~~ SOLN: 40.0000 [IU] | Freq: Two times a day (BID) | SUBCUTANEOUS | Status: DC

## 2014-02-09 MED ORDER — OXYCODONE HCL 5 MG PO TABS: 5.0000 mg | ORAL_TABLET | ORAL | Status: DC | PRN

## 2014-02-09 MED ORDER — INSULIN ASPART 100 UNIT/ML ~~LOC~~ SOLN: 0.0000 [IU] | Freq: Three times a day (TID) | SUBCUTANEOUS | Status: DC

## 2014-02-09 NOTE — Progress Notes (Signed)
Patient Demographics  [name], is a 73 y.o. female, DOB - 1941-03-26, BMW:[id number]  Admit date - 02/03/2014   Admitting Physician Mauri Pole, MD  Outpatient Primary MD for the patient is PROVIDER NOT IN SYSTEM  LOS - 6   Chief Complaint  Patient presents with  . Fall  . Hip Injury         Subjective:   [name] today has, No headache, No chest pain, No abdominal pain - No Nausea, No new weakness tingling or numbness, No Cough - SOB. Feels better and wants to go to rehabilitation tomorrow. Does have some right big toe pain    Assessment & Plan    1. Mech fall with left intertrochanteric fracture, right distal fibula and first metatarsophalangeal joint fracture.     Left intertrochanteric fracture - ORIF by Dr. Paralee Cancel on 02/04/2014    R ankle nondisplaced fracture - per orthopedics can be managed safely in closed fashion. Nonweightbearing to right leg and in splint bandage.   R hallux MPJ fracture / dislocation - ORIF 02-08-14 - [facility]    Weightbearing status is partial weight bearing on the left, no weight bearing on the right currently.     2. Mild aortic stenosis noted on echogram done in regional with nonspecific EKG changes. Seen by cardiologist Dr. Doylene Canard, will place her on her home dose aspirin 81 mg from today, no acute issues. Chest pain-free we'll follow with Dr. Doylene Canard postdischarge.     3. Delirium. Minimize narcotics and benzodiazepines, QTC is stable, as needed Haldol. Fall and aspiration precautions. Family educated bedside. They are okay with restraints as needed. Delirium has improved.     4. Hypertension. Currently on Norvasc, add Coreg for better control. On when necessary hydralazine.    5. DM type II. Outpatient control poor, A1c  greater than 11. Currently on Levemir 40 units twice a day along with sliding scale with stable control  CBG (last 3)   Recent Labs  02/08/14 2043 02/09/14 0139 02/09/14 0517  GLUCAP 209* 107* 151*       5. Dyslipidemia. Home dose statin continued.     6. Underlying chronic diastolic CHF with mild acute decompensation as evident on chest x-ray and physical exam. Resolved after Low-dose Lasix, place on home dose HCTZ, continue fluid and salt restriction, monitor clinically. EF is 60% with mild aortic stenosis on echogram.     7. Lower extremity edema. Due to mild acute on chronic diastolic CHF, diuresed with Lasix, now placed on home dose HCTZ, lower extremity venous duplex unremarkable.      8. Mild leukocytosis. due to UTI placed on Levaquin for 3 days pharmacy to dose. Echo cytosis resolved.     9.GERD - On PPI     10. Diabetic neuropathy. On Neurontin at a lower than home dose due to confusion, stable.      11.Low K - replaced and stable      Code Status: Full  Family Communication: Husband and daughter bedside 02/05/2014, Daughter 02/06/2014, husband 02/07/2014, son bedside 02/08/2014, husband 8 20/15  Disposition Plan: Likely SNF on 02/10/2014    Procedures     ORIF L Hip 02-04-14 Dr Alvan Dame   R hallux MPJ fracture /  dislocation - to OR for ORIF 02-08-14 - [facility]     Echo    Impressions:  - Moderate to severe LVH with LVEF 58-52%, grade 1 diastolic dysfunction. Severe left atrial enlargement. 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 Unable to assess PASP    Consults  Ortho, Cards   Medications  Scheduled Meds: . amitriptyline  10 mg Oral QHS  . amLODipine  5 mg Oral Daily  . atorvastatin  20 mg Oral QHS  . bisacodyl  10 mg Rectal Daily  . carvedilol  3.125 mg Oral BID WC  . docusate sodium  200 mg Oral BID  . enoxaparin (LOVENOX) injection  40 mg Subcutaneous Q24H  . fluticasone  1  spray Each Nare Daily  . gabapentin  100 mg Oral BID  . hydrALAZINE  25 mg Oral QID  . hydrochlorothiazide  25 mg Oral Daily  . hydrocortisone  1 application Topical QID  . insulin aspart  0-15 Units Subcutaneous Q4H  . insulin aspart  0-5 Units Subcutaneous QHS  . insulin detemir  25 Units Subcutaneous Once  . insulin detemir  40 Units Subcutaneous BID  . pantoprazole  40 mg Oral Daily  . polyethylene glycol  17 g Oral BID   Continuous Infusions: . sodium chloride 50 mL/hr at 02/08/14 1917   PRN Meds:.acetaminophen, alum & mag hydroxide-simeth, haloperidol lactate, ondansetron (ZOFRAN) IV, ondansetron, oxyCODONE, zolpidem    DVT Prophylaxis  Lovenox  - SCDs   Lab Results  Component Value Date   PLT 174 02/08/2014      Antibiotics     Anti-infectives   Start     Dose/Rate Route Frequency Ordered Stop   02/08/14 0600  clindamycin (CLEOCIN) IVPB 900 mg     900 mg 100 mL/hr over 30 Minutes Intravenous On call to O.R. 02/07/14 1408 02/08/14 0944   02/06/14 1000  levofloxacin (LEVAQUIN) tablet 500 mg    Comments:  Pharmacy can adjust for UTI   500 mg Oral Daily 02/06/14 0922 02/08/14 1919   02/04/14 2116  ceFAZolin (ANCEF) 2-3 GM-% IVPB SOLR    Comments:  Ubaldo Glassing   : cabinet override      02/04/14 2116 02/05/14 0929   02/04/14 0600  vancomycin (VANCOCIN) IVPB 1000 mg/200 mL premix  Status:  Discontinued     1,000 mg 200 mL/hr over 60 Minutes Intravenous On call to O.R. 02/03/14 1751 02/04/14 2304          Objective:   Filed Vitals:   02/08/14 1321 02/08/14 2040 02/09/14 0136 02/09/14 0515  BP: 168/62 150/45 141/41 152/47  Pulse: 80 71 80 85  Temp: 98.2 F (36.8 C) 98 F (36.7 C) 98 F (36.7 C) 98.1 F (36.7 C)  TempSrc:  Oral Oral Oral  Resp:  18 18 18   Height:      Weight:      SpO2: 92% 92% 97% 100%    Wt Readings from Last 3 Encounters:  02/04/14 90.719 kg (200 lb)  02/04/14 90.719 kg (200 lb)  02/04/14 90.719 kg (200 lb)      Intake/Output Summary (Last 24 hours) at 02/09/14 0842 Last data filed at 02/09/14 0700  Gross per 24 hour  Intake   1950 ml  Output      0 ml  Net   1950 ml     Physical Exam  Awake , very mildly confused today, No new F.N deficits, Normal affect [place].AT,PERRAL Supple Neck,No JVD, No cervical  lymphadenopathy appriciated.  Symmetrical Chest wall movement, Good air movement bilaterally, CTAB RRR,No Gallops,Rubs or new Murmurs, No Parasternal Heave +ve B.Sounds, Abd Soft, No tenderness, No organomegaly appriciated, No rebound - guarding or rigidity. No Cyanosis, Clubbing , No new Rash or bruise, R.Leg in splint bandage, no edema     Data Review   Micro Results Recent Results (from the past 240 hour(s))  MRSA PCR SCREENING     Status: None   Collection Time    02/03/14  7:03 PM      Result Value Ref Range Status   MRSA by PCR NEGATIVE  NEGATIVE Final   Comment:            The GeneXpert MRSA Assay (FDA     approved for NASAL specimens     only), is one component of a     comprehensive MRSA colonization     surveillance program. It is not     intended to diagnose MRSA     infection nor to guide or     monitor treatment for     MRSA infections.  URINE CULTURE     Status: None   Collection Time    02/07/14 11:33 PM      Result Value Ref Range Status   Specimen Description URINE, CLEAN CATCH   Final   Special Requests NONE   Final   Culture  Setup Time     Final   Value: 02/08/2014 04:02     Performed at [facility] Count     Final   Value: NO GROWTH     Performed at [facility]   Culture     Final   Value: NO GROWTH     Performed at [facility]   Report Status 02/09/2014 FINAL   Final    Radiology Reports Dg Chest 1 View  02/03/2014   CLINICAL DATA:  Fall.  Hip injury  EXAM: CHEST - 1 VIEW  COMPARISON:  None.  FINDINGS: Cardiac enlargement without heart failure. Lungs are clear. No displaced rib fracture.  IMPRESSION:  Cardiac enlargement.  No active cardiopulmonary disease.   Electronically Signed   By: Franchot Gallo M.D.   On: 02/03/2014 10:54   Dg Hip Complete Left  02/03/2014   CLINICAL DATA:  Fall  EXAM: LEFT HIP - COMPLETE 2+ VIEW  COMPARISON:  None.  FINDINGS: Sub trochanteric fracture on the left with angulation and medial displacement. Left hip joint appears normal. No other fracture.  IMPRESSION: Displaced subtrochanteric fracture left femur   Electronically Signed   By: Franchot Gallo M.D.   On: 02/03/2014 10:55   Dg Femur Left  02/04/2014   CLINICAL DATA:  Intra medullary nail fixation  EXAM: LEFT FEMUR - 2 VIEW  COMPARISON:  02/03/2014  FINDINGS: Status post open reduction and internal fixation of a subtrochanteric left femur fracture. There is no evidence intra procedural complication. The hip is located.  IMPRESSION: Proximal left femur fracture ORIF.  No adverse findings.   Electronically Signed   By: Jorje Guild M.D.   On: 02/04/2014 23:47   Dg Ankle Complete Right  02/03/2014   CLINICAL DATA:  Fall  EXAM: RIGHT ANKLE - COMPLETE 3+ VIEW  COMPARISON:  None.  FINDINGS: Oblique nondisplaced fracture of the distal fibula. No fracture of the tibia. Ankle mortise intact.  Arterial calcification.  Calcaneal spurring.  IMPRESSION: Nondisplaced fracture distal fibula.   Electronically Signed   By: Juanda Crumble  Carlis Abbott M.D.   On: 02/03/2014 10:59   Ct Head Wo Contrast  02/03/2014   CLINICAL DATA:  Fall  EXAM: CT HEAD WITHOUT CONTRAST  TECHNIQUE: Contiguous axial images were obtained from the base of the skull through the vertex without intravenous contrast.  COMPARISON:  None.  FINDINGS: No skull fracture is noted. The mastoid air cells are unremarkable. There is mucosal thickening with partial opacification left ethmoid air cells.  Mild cerebral atrophy. Mild periventricular white matter decreased attenuation probable due to chronic small vessel ischemic changes. No acute cortical infarction. No mass lesion is noted  on this unenhanced scan.  IMPRESSION: No acute intracranial abnormality. Mild cerebral atrophy. Periventricular white matter decreased attenuation probable due to chronic small vessel ischemic changes.   Electronically Signed   By: Lahoma Crocker M.D.   On: 02/03/2014 11:13   Dg Chest Port 1 View  02/05/2014   CLINICAL DATA:  Cough.  Shortness of breath.  But fracture.  EXAM: PORTABLE CHEST - 1 VIEW  COMPARISON:  One-view chest 02/03/2014.  FINDINGS: The heart is enlarged. Moderate pulmonary vascular congestion is now present. Mild atelectasis is evident at the bases. No significant airspace consolidation is present. The visualized soft tissues and bony thorax are unremarkable.  IMPRESSION: 1. Stable cardiomegaly with new moderate pulmonary vascular congestion. Early failure is considered. 2. Mild bibasilar atelectasis without other significant airspace disease.   Electronically Signed   By: Lawrence Santiago M.D.   On: 02/05/2014 08:05   Dg Knee Complete 4 Views Left  02/03/2014   CLINICAL DATA:  Fall  EXAM: LEFT KNEE - COMPLETE 4+ VIEW  COMPARISON:  None.  FINDINGS: There is no evidence of fracture, dislocation, or joint effusion. There is no evidence of arthropathy or other focal bone abnormality. Soft tissues are unremarkable.  IMPRESSION: Negative.   Electronically Signed   By: Franchot Gallo M.D.   On: 02/03/2014 10:58   Dg Ankle Right Port  02/05/2014   CLINICAL DATA:  Right fibula fracture.  EXAM: PORTABLE RIGHT ANKLE - 2 VIEW  COMPARISON:  02/03/2014.  FINDINGS: A nondisplaced oblique fracture of the distal fibula is again demonstrated. No displacement or angulation. Interval fiberglass cast.  IMPRESSION: Anatomic position and alignment of the previously demonstrated distal fibular fracture with an interval cast.   Electronically Signed   By: Enrique Sack M.D.   On: 02/05/2014 08:55   Dg Foot 2 Views Right  02/05/2014   CLINICAL DATA:  Followup right first MTP fracture/dislocation.  EXAM: RIGHT FOOT - 2  VIEW  COMPARISON:  02/03/2014.  FINDINGS: Interval fiberglass cast extending to the level of the proximal first phalanx. There is continued dorsal and proximal dislocation of the first proximal phalanx relative to the first metatarsal with an associated mildly comminuted fracture of the first metatarsal head and displacement of two of the first metatarsal head fragments in conjunction with the proximal phalanx.  IMPRESSION: Stable fracture/dislocation at the first MTP joint, as described above   Electronically Signed   By: Enrique Sack M.D.   On: 02/05/2014 08:58   Dg Foot 2 Views Right  02/03/2014   CLINICAL DATA:  Postreduction.  EXAM: RIGHT FOOT - 2 VIEW  COMPARISON:  Right foot radiograph February 03, 2014 at 1018 hr.  FINDINGS: Suspected residual subluxation without frank dislocation of first metatarsophalangeal fracture dislocation seen on prior radiograph. Dorsal foot soft tissue swelling without subcutaneous gas radiopaque foreign bodies. Moderate vascular calcifications.  IMPRESSION: Improved alignment of first metatarsophalangeal fracture dislocation.  Electronically Signed   By: Elon Alas   On: 02/03/2014 12:33   Dg Foot Complete Right  02/03/2014   CLINICAL DATA:  Fall  EXAM: RIGHT FOOT COMPLETE - 3+ VIEW  COMPARISON:  None.  FINDINGS: Dorsal dislocation of the first metatarsophalangeal joint. Possible small avulsion fracture.  No other fracture.  Diffuse arterial calcification.  IMPRESSION: Dislocation of the first MTP   Electronically Signed   By: Franchot Gallo M.D.   On: 02/03/2014 10:57   Dg C-arm 61-120 Min-no Report  02/04/2014   CLINICAL DATA: intraop   C-ARM 61-120 MINUTES  Fluoroscopy was utilized by the requesting physician.  No radiographic  interpretation.     CBC  Recent Labs Lab 02/03/14 0913 02/04/14 0553 02/05/14 0512 02/06/14 0405 02/07/14 0500 02/08/14 0548  WBC 14.2* 11.9* 14.7* 12.5* 12.1* 10.0  HGB 14.2 13.2 12.9 11.5* 11.9* 11.5*  HCT 44.0 42.7 41.4 36.4  38.3 37.7  PLT 195 189 159 180 138* 174  MCV 81.3 83.7 84.1 82.9 84.2 85.3  MCH 26.2 25.9* 26.2 26.2 26.2 26.0  MCHC 32.3 30.9 31.2 31.6 31.1 30.5  RDW 15.7* 16.0* 16.3* 16.4* 16.3* 16.0*  LYMPHSABS 1.8  --   --   --   --   --   MONOABS 0.7  --   --   --   --   --   EOSABS 0.5  --   --   --   --   --   BASOSABS 0.1  --   --   --   --   --     Chemistries   Recent Labs Lab 02/04/14 0553 02/05/14 0512 02/06/14 0405 02/07/14 0500 02/08/14 0548  NA 142 144 139 137 141  K 4.0 4.1 3.6* 4.5 4.2  CL 100 99 96 95* 97  CO2 34* 27 32 34* 36*  GLUCOSE 195* 253* 263* 180* 154*  BUN 13 13 13 9 9   CREATININE 0.73 0.65 0.74 0.63 0.56  CALCIUM 8.8 9.0 8.7 8.9 8.9   ------------------------------------------------------------------------------------------------------------------ estimated creatinine clearance is 71.8 ml/min (by C-G formula based on Cr of 0.56). ------------------------------------------------------------------------------------------------------------------ No results found for this basename: HGBA1C,  in the last 72 hours ------------------------------------------------------------------------------------------------------------------ No results found for this basename: CHOL, HDL, LDLCALC, TRIG, CHOLHDL, LDLDIRECT,  in the last 72 hours ------------------------------------------------------------------------------------------------------------------ No results found for this basename: TSH, T4TOTAL, FREET3, T3FREE, THYROIDAB,  in the last 72 hours ------------------------------------------------------------------------------------------------------------------ No results found for this basename: VITAMINB12, FOLATE, FERRITIN, TIBC, IRON, RETICCTPCT,  in the last 72 hours  Coagulation profile  Recent Labs Lab 02/03/14 0913 02/04/14 1942  INR 1.03 1.08    No results found for this basename: DDIMER,  in the last 72 hours  Cardiac Enzymes  Recent Labs Lab  02/03/14 0913  TROPONINI <0.30   ------------------------------------------------------------------------------------------------------------------ No components found with this basename: POCBNP,      Time Spent in minutes  35   SINGH,PRASHANT K M.D on 02/09/2014 at 8:42 AM  Between 7am to 7pm - Pager - [phone]  After 7pm go to [email or web address] - password [email or web address]  And look for the night coverage person covering for me after hours  [facility] Office  [phone]   **Disclaimer: This note may have been dictated with voice recognition software. Similar sounding words can inadvertently be transcribed and this note may contain transcription errors which may not have been corrected upon publication of note.**

## 2014-02-09 NOTE — Progress Notes (Signed)
PT Cancellation Note  Patient Details Name: [name] MRN: [id number] DOB: Dec 16, 1940   Cancelled Treatment:    Reason Eval/Treat Not Completed: Pain limiting ability to participate.  Will f/u another time.     Alec Mcphee, Thornton Papas 02/09/2014, 10:56 AM

## 2014-02-09 NOTE — Progress Notes (Addendum)
Clinical Social Work Department CLINICAL SOCIAL WORK PLACEMENT NOTE 02/09/2014  Patient:  [name], [name]  Account Number:  [id number] Admit date:  02/03/2014  Clinical Social Worker:  Beverly Sessions  Date/time:  02/04/2014 11:25 AM  Clinical Social Work is seeking post-discharge placement for this patient at the following level of care:   [place]   (*CSW will update this form in Epic as items are completed)   02/04/2014  Patient/family provided with [place] Department of Clinical Social Work's list of facilities offering this level of care within the geographic area requested by the patient (or if unable, by the patient's family).  02/04/2014  Patient/family informed of their freedom to choose among providers that offer the needed level of care, that participate in Medicare, Medicaid or managed care program needed by the patient, have an available bed and are willing to accept the patient.  02/04/2014  Patient/family informed of [facility]' ownership interest in [facility], as well as of the fact that they are under no obligation to receive care at this facility.  PASARR submitted to EDS on 02/03/2014 PASARR number received on 02/03/2014  FL2 transmitted to all facilities in geographic area requested by pt/family on  02/04/2014 FL2 transmitted to all facilities within larger geographic area on   Patient informed that his/her managed care company has contracts with or will negotiate with  certain facilities, including the following:     Patient/family informed of bed offers received:  02/07/2014 Patient chooses bed at [facility] Physician recommends and patient chooses bed at    Patient to be transferred to [facility] on  02/10/2014 Patient to be transferred to facility by EMS Patient and family notified of transfer on 02/10/2014 Name of family member notified:  HUSBAND  The following physician request were entered in  Epic:   Additional Comments:

## 2014-02-09 NOTE — Progress Notes (Signed)
Chaplain Note: Patient requested a Chaplain, specifically for prayer and to be given a Bible. Chaplain responded to this request, talking with family, listening to patient's concerns, and praying with patient. Patient and family were very appreciative of the visit. Patient is interested in another visit. Patient was also given a Bible.  Sheryn Bison, Chaplain

## 2014-02-09 NOTE — Progress Notes (Signed)
Patient ID: [name], female   DOB: [birth year], 73 y.o.   MRN: [id number] Subjective: 1 Day Post-Op Procedure(s) (LRB): OPEN REDUCTION INTERNAL FIXATION Right METATARSAL  FRACTURE  (Right)  POD#5 s/p ORIF left proximal femur fracture    Patient reports pain as mild to moderate from recent right foot surgery, throbbing pain.  Hip remains sore particularly with movement Objective:   VITALS:   Filed Vitals:   02/09/14 0515  BP: 152/47  Pulse: 85  Temp: 98.1 F (36.7 C)  Resp: 18    Incision: left hip incisions healed Right foot and ankle in post operatively, splint/dresssing  LABS  Recent Labs  02/07/14 0500 02/08/14 0548  HGB 11.9* 11.5*  HCT 38.3 37.7  WBC 12.1* 10.0  PLT 138* 174     Recent Labs  02/07/14 0500 02/08/14 0548  NA 137 141  K 4.5 4.2  BUN 9 9  CREATININE 0.63 0.56  GLUCOSE 180* 154*    No results found for this basename: LABPT, INR,  in the last 72 hours   Assessment/Plan: 1 Day Post-Op Procedure(s) (LRB): OPEN REDUCTION INTERNAL FIXATION Right METATARSAL  FRACTURE  (Right)   Up with therapy Discharge to SNF probable tomorrow based on therapy assessment  Left lower extremity by itself would permit partial weight bearing for up to 6 weeks.  This is complicated by right foot and ankle injuries and procedures. I would allow use of left lower extremity for transfers only I would have her doing daily exercises in bed to prevent significant wasting  Follow up with [facility] in about 2 weeks on a Wednesday or Friday to coordinate with Doran Durand and [place] office

## 2014-02-09 NOTE — Op Note (Signed)
NAME:  [name], [name]                 ACCOUNT NO.:  [id number]  MEDICAL RECORD NO.:  [id number]  LOCATION:  5N06C                        FACILITY:  [facility]  PHYSICIAN:  Wylene Simmer, MD        DATE OF BIRTH:  [birth year]  DATE OF PROCEDURE:  02/08/2014 DATE OF DISCHARGE:                              OPERATIVE REPORT   PREOPERATIVE DIAGNOSIS:  Right hallux metatarsophalangeal joint fracture dislocation.  POSTOPERATIVE DIAGNOSIS:  Right hallux metatarsophalangeal joint fracture dislocation.  PROCEDURE: 1. Open reduction of right hallux metatarsophalangeal joint fracture     dislocation. 2. Right hallux metatarsophalangeal joint arthrodesis. 3. AP and lateral radiographs of the right foot.  SURGEON:  Wylene Simmer, MD.  ANESTHESIA:  General.  ESTIMATED BLOOD LOSS:  Minimal.  TOURNIQUET TIME:  49 minutes at 250 mmHg.  COMPLICATIONS:  None apparent.  DISPOSITION:  Extubated, awake, and stable to recovery.  INDICATIONS FOR PROCEDURE:  The patient is a 73 year old woman who fell late last week, sustaining a left hip intertrochanteric fracture and a right hallux MP joint fracture, dislocation, as well as a right nondisplaced fibular fracture.  She underwent intramedullary nailing of the left hip fracture late last week.  She is undergoing closed treatment of her ankle fracture.  She presents now for open reduction and internal fixation of the hallux MP joint fracture dislocation versus primary arthrodesis.  She and her family understand the risks and benefits, the alternative treatment options, and elects surgical treatment.  They specifically understand risks of bleeding, infection, nerve damage, blood clots, need for additional surgery, continued pain, amputation, and death.  PROCEDURE IN DETAIL:  After preoperative consent was obtained and the correct operative site was identified, the patient was brought to the operating room and placed supine on the operating table.   General anesthesia was induced.  Preoperative antibiotics were administered. Surgical time-out was taken.  The right lower extremity was prepped and draped in standard sterile fashion with tourniquet around the thigh. The extremity was exsanguinated and the tourniquet was inflated to 250 mmHg.  The patient's hallux MP joint was reduced in closed fashion, but the joint immediately redislocated and was noted to be grossly unstable. A longitudinal incision was then made over the hallux MP joint.  Sharp dissection was carried down through the skin and subcutaneous tissue. The extensor hallucis longus and brevis tendons were protected.  The dorsal joint capsule was incised.  Immediately evident was a severely comminuted fracture of the head of the metatarsal with virtually no intact articular surface.  A shell of articular cartilage and subchondral bone was removed from the medial third of the joint.  The lateral third of the base of the proximal phalanx was similarly destroyed with severe comminution and articular cartilage loss.  Based on the severe damage to the articular surfaces, the decision was made to proceed with primary arthrodesis.  Once the joint was reduced, a K-wire was inserted into the head of the metatarsal.  A convex reamer was used to remove the remaining articular cartilage and the lateral portion of the joint as well as the subchondral bone.  The K-wire was removed and placed into the base of the proximal  phalanx.  Again, the remaining articular cartilage was removed with the reamer.  The joint was then irrigated copiously.  The remaining subchondral bone was perforated with a 2.5-mm drill bit leaving the resultant bone graft in place.  The joint was reduced and provisionally pinned.  AP and lateral radiographs confirmed appropriate reduction of the hallux MP joint.  The arthrodesis was then fixed with a percutaneously inserted 4-0 cannulated screw from proximal to distal,  medial to lateral.  This was noted to have excellent compression across the remaining joint surfaces.  A locking Alps plate from the [employer] set was selected.  This was the small version of the MP joint arthrodesis plate.  It was applied over the joint.  AP and lateral radiographs confirmed appropriate positioning of the plate.  It was then secured proximally and distally with a nonlocking screw.  The remaining holes were drilled and filled with locking screws.  Final AP and lateral radiographs confirmed appropriate position and length of all hardware and appropriate reduction of the hallux MP joint.  The wound was irrigated copiously.  The deep subcutaneous tissues and the dorsal joint capsule were closed over the plate with simple sutures of 3-0 Monocryl. Horizontal mattress sutures of 3-0 nylon were used to close the skin incision.  Sterile dressings were applied and followed by a well-padded short-leg splint.  The patient was then awakened from anesthesia and transported to the recovery room in stable condition.  The tourniquet had been released at 49 minutes after application of the dressings.  FOLLOWUP PLAN:  The patient will be weightbearing as tolerated on the right foot.  The patient will be nonweightbearing on the right foot. She will follow up with me in 2 weeks for suture removal and ankle radiographs.  Hopefully, we will be able to convert her to weightbearing as tolerated in a CAM walker at that time.  RADIOGRAPHS:  AP and lateral radiographs of the right foot were obtained intraoperatively today.  These show interval arthrodesis of the hallux MP joint, with appropriate reduction of the hallux MP joint as well.  No other fracture, dislocation, or malalignment is noted.     Wylene Simmer, MD     JH/MEDQ  D:  02/08/2014  T:  02/09/2014  Job:  [id number]

## 2014-02-09 NOTE — Progress Notes (Signed)
PT Cancellation Note  Patient Details Name: ARIES KASA MRN: [id number] DOB: Feb 23, 1941   Cancelled Treatment:    Reason Eval/Treat Not Completed: Patient declined, no reason specified.  Pt adamantly refusing PT at this time stating she is tired and hurting and currently urinating in the bed.  RN aware.  Will try another time.     Hosie Sharman, Thornton Papas 02/09/2014, 2:51 PM

## 2014-02-09 NOTE — Progress Notes (Signed)
OT Cancellation Note  Patient Details Name: [name] MRN: [id number] DOB: 30-Nov-1940   Cancelled Treatment:    Reason Eval/Treat Not Completed: Other (comment) (Pt is going to SNF, deferring OT needs to next venue.)  Hortencia Pilar 02/09/2014, 2:02 PM

## 2014-02-09 NOTE — Progress Notes (Signed)
Subjective: 1 Day Post-Op Procedure(s) (LRB): OPEN REDUCTION INTERNAL FIXATION Right METATARSAL  FRACTURE  (Right) Patient reports pain as moderate.  No n/v/f/c.  Objective: Vital signs in last 24 hours: Temp:  [98 F (36.7 C)-98.2 F (36.8 C)] 98.1 F (36.7 C) (07/08 0515) Pulse Rate:  [71-85] 85 (07/08 0515) Resp:  [18] 18 (07/08 0515) BP: (141-168)/(41-62) 152/47 mmHg (07/08 0515) SpO2:  [92 %-100 %] 100 % (07/08 0515)  Intake/Output from previous day: 07/07 0701 - 07/08 0700 In: 1950 [P.O.:600; I.V.:1350] Out: -  Intake/Output this shift: Total I/O In: 120 [P.O.:120] Out: -    Recent Labs  02/07/14 0500 02/08/14 0548  HGB 11.9* 11.5*    Recent Labs  02/07/14 0500 02/08/14 0548  WBC 12.1* 10.0  RBC 4.55 4.42  HCT 38.3 37.7  PLT 138* 174    Recent Labs  02/07/14 0500 02/08/14 0548  NA 137 141  K 4.5 4.2  CL 95* 97  CO2 34* 36*  BUN 9 9  CREATININE 0.63 0.56  GLUCOSE 180* 154*  CALCIUM 8.9 8.9   No results found for this basename: LABPT, INR,  in the last 72 hours  PE:  wn wd woman in nad.  Alert.  Oriented to person.  R foot splinted.  Brisk cap refill at toes.  Sens to LT intact.  No gross deformity.  Assessment/Plan: 1 Day Post-Op Procedure(s) (LRB): OPEN REDUCTION INTERNAL FIXATION Right METATARSAL  FRACTURE  (Right) NWB on the R LE until she follows up in clinic.  OK for d/c to SNF when cleared by [facility] MD and SNF bed available.  L hip per Dr. Alvan Dame.  Wylene Simmer 02/09/2014, 12:55 PM

## 2014-02-10 ENCOUNTER — Inpatient Hospital Stay
Admission: RE | Admit: 2014-02-10 | Discharge: 2014-02-11 | Disposition: A | Discharge status: Other Acute Inpatient Hospital | Payer: Medicare HMO | Source: Ambulatory Visit | Attending: Internal Medicine | Admitting: Internal Medicine

## 2014-02-10 ENCOUNTER — Other Ambulatory Visit: Payer: Self-pay | Admitting: *Deleted

## 2014-02-10 ENCOUNTER — Inpatient Hospital Stay (HOSPITAL_COMMUNITY): Payer: Medicare HMO

## 2014-02-10 DIAGNOSIS — I509 Heart failure, unspecified: Principal | ICD-10-CM

## 2014-02-10 LAB — GLUCOSE, CAPILLARY
GLUCOSE-CAPILLARY: 131 mg/dL — AB (ref 70–99)
GLUCOSE-CAPILLARY: 153 mg/dL — AB (ref 70–99)
GLUCOSE-CAPILLARY: 189 mg/dL — AB (ref 70–99)
Glucose-Capillary: 125 mg/dL — ABNORMAL HIGH (ref 70–99)
Glucose-Capillary: 124 mg/dL — ABNORMAL HIGH (ref 70–99)

## 2014-02-10 LAB — CBC
HCT: 37 % (ref 36.0–46.0)
HEMOGLOBIN: 11.6 g/dL — AB (ref 12.0–15.0)
MCH: 26 pg (ref 26.0–34.0)
MCHC: 31.4 g/dL (ref 30.0–36.0)
MCV: 82.8 fL (ref 78.0–100.0)
Platelets: 207 10*3/uL (ref 150–400)
RBC: 4.47 MIL/uL (ref 3.87–5.11)
RDW: 16.1 % — ABNORMAL HIGH (ref 11.5–15.5)
WBC: 14.5 10*3/uL — ABNORMAL HIGH (ref 4.0–10.5)

## 2014-02-10 LAB — BASIC METABOLIC PANEL
Anion gap: 11 (ref 5–15)
BUN: 9 mg/dL (ref 6–23)
CALCIUM: 9.4 mg/dL (ref 8.4–10.5)
CO2: 32 meq/L (ref 19–32)
CREATININE: 0.63 mg/dL (ref 0.50–1.10)
Chloride: 92 mEq/L — ABNORMAL LOW (ref 96–112)
GFR calc Af Amer: >90 mL/min (ref >90)
GFR calc non Af Amer: 88 mL/min — ABNORMAL LOW (ref >90)
Glucose, Bld: 119 mg/dL — ABNORMAL HIGH (ref 70–99)
Potassium: 3.8 mEq/L (ref 3.7–5.3)
Sodium: 135 mEq/L — ABNORMAL LOW (ref 137–147)

## 2014-02-10 MED ORDER — AMLODIPINE BESYLATE 10 MG PO TABS: 10.0000 mg | ORAL_TABLET | Freq: Every day | ORAL | Status: DC

## 2014-02-10 MED ORDER — CARVEDILOL 3.125 MG PO TABS: 3.1250 mg | ORAL_TABLET | Freq: Two times a day (BID) | ORAL | Status: DC

## 2014-02-10 MED ORDER — LISINOPRIL 5 MG PO TABS
5.0000 mg | ORAL_TABLET | Freq: Every day | ORAL | Status: DC
  Administered 2014-02-10: 5 mg via ORAL
  Filled 2014-02-10: qty 1

## 2014-02-10 MED ORDER — AMLODIPINE BESYLATE 10 MG PO TABS
10.0000 mg | ORAL_TABLET | Freq: Every day | ORAL | Status: DC
  Administered 2014-02-10: 10 mg via ORAL
  Filled 2014-02-10: qty 1

## 2014-02-10 NOTE — Progress Notes (Signed)
Patient ID: [name], female   DOB: 14-Jan-1941, 73 y.o.   MRN: [id number] Subjective: 2 Days Post-Op Procedure(s) (LRB): OPEN REDUCTION INTERNAL FIXATION Right METATARSAL  FRACTURE  (Right)    POD#6 s/p ORIF left proximal femur fracture  Patient reports pain as mild to moderate in both locations  Objective:   VITALS:   Filed Vitals:   02/10/14 0421  BP: 163/48  Pulse: 95  Temp: 99.6 F (37.6 C)  Resp: 18    Neurovascular intact Incision: dressing C/D/I, incisions healing nicely  LABS  Recent Labs  02/08/14 0548 02/09/14 1315 02/10/14 0522  HGB 11.5* 11.4* 11.6*  HCT 37.7 35.9* 37.0  WBC 10.0 14.1* 14.5*  PLT 174 202 207     Recent Labs  02/08/14 0548 02/09/14 1315 02/10/14 0522  NA 141 136* 135*  K 4.2 4.5 3.8  BUN 9 10 9   CREATININE 0.56 0.70 0.63  GLUCOSE 154* 234* 119*    No results found for this basename: LABPT, INR,  in the last 72 hours   Assessment/Plan: 2 Days Post-Op Procedure(s) (LRB): OPEN REDUCTION INTERNAL FIXATION Right METATARSAL  FRACTURE  (Right)   Discharge to SNF probably today  Due to lower extremity traumas and restrictions of activity this will be a very challenging 2-3 months.  I encourage she and her family to try and keep her physically and mentally active RTC in 1-2 weeks for follow up with Doran Durand and Alvan Dame

## 2014-02-10 NOTE — Discharge Instructions (Signed)
Schedule follow up appointment for either Wednesday or Friday to coordinate office visits with Alvan Dame and [place]  May use Left lower extremity for transfers, Encourage daily exercise to prevent waisting during this cumbersome period of bilateral lower extremity trauma  Follow with Primary MD  in 7 days   Get CBC, CMP, 2 view Chest X ray checked  by Primary MD next visit.    Activity: No weight bearing right leg, partial weight bearing left leg, use Full fall precautions use walker/cane & assistance as needed   Disposition SNF rehabilitation   Diet: Heart Healthy - Low Carb feeding assistance and aspiration precautions if needed.  Accuchecks 4 times/day, Once in AM empty stomach and then before each meal. Log in all results and show them to your Prim.MD in 3 days. If any glucose reading is under 80 or above 300 call your Prim MD immidiately. Follow Low glucose instructions for glucose under 80 as instructed.   For Heart failure patients - Check your Weight same time everyday, if you gain over 2 pounds, or you develop in leg swelling, experience more shortness of breath or chest pain, call your Primary MD immediately. Follow Cardiac Low Salt Diet and 1.8 lit/day fluid restriction.   On your next visit with her primary care physician please Get Medicines reviewed and adjusted.  Please request your Prim.MD to go over all Hospital Tests and Procedure/Radiological results at the follow up, please get all Hospital records sent to your Prim MD by signing hospital release before you go home.   If you experience worsening of your admission symptoms, develop shortness of breath, life threatening emergency, suicidal or homicidal thoughts you must seek medical attention immediately by calling 911 or calling your MD immediately  if symptoms less severe.  You Must read complete instructions/literature along with all the possible adverse reactions/side effects for all the Medicines you take and that  have been prescribed to you. Take any new Medicines after you have completely understood and accpet all the possible adverse reactions/side effects.   Do not drive, operating heavy machinery, perform activities at heights, swimming or participation in water activities or provide baby sitting services if your were admitted for syncope or siezures until you have seen by Primary MD or a Neurologist and advised to do so again.  Do not drive when taking Pain medications.    Do not take more than prescribed Pain, Sleep and Anxiety Medications  Special Instructions: If you have smoked or chewed Tobacco  in the last 2 yrs please stop smoking, stop any regular Alcohol  and or any Recreational drug use.  Wear Seat belts while driving.   Please note  You were cared for by a hospitalist during your hospital stay. If you have any questions about your discharge medications or the care you received while you were in the hospital after you are discharged, you can call the unit and asked to speak with the hospitalist on call if the hospitalist that took care of you is not available. Once you are discharged, your primary care physician will handle any further medical issues. Please note that NO REFILLS for any discharge medications will be authorized once you are discharged, as it is imperative that you return to your primary care physician (or establish a relationship with a primary care physician if you do not have one) for your aftercare needs so that they can reassess your need for medications and monitor your lab values.

## 2014-02-10 NOTE — Progress Notes (Signed)
Rept given to Delia Chimes at [facility]. Pt transferred via [facility] via stretcher without incident.

## 2014-02-10 NOTE — Progress Notes (Addendum)
Clinical Social Worker facilitated patient discharge including contacting patient, family and facility to confirm patient discharge plans.  Clinical information faxed to facility and patient agreeable with plan.  CSW arranged ambulance transport via [place] for 2pm to [facility].  RN to call report prior to discharge.  Clinical Social Worker will sign off for now as social work intervention is no longer needed. Please consult [place] again if new need arises.

## 2014-02-10 NOTE — Progress Notes (Signed)
Clinical Social Work Department CLINICAL SOCIAL WORK PLACEMENT NOTE 02/10/2014  Patient:  [name], [name]  Account Number:  [id number] Admit date:  02/03/2014  Clinical Social Worker:  Beverly Sessions  Date/time:  02/04/2014 11:25 AM  Clinical Social Work is seeking post-discharge placement for this patient at the following level of care:   [place]   (*CSW will update this form in Epic as items are completed)   02/04/2014  Patient/family provided with [place] Department of Clinical Social Work's list of facilities offering this level of care within the geographic area requested by the patient (or if unable, by the patient's family).  02/04/2014  Patient/family informed of their freedom to choose among providers that offer the needed level of care, that participate in Medicare, Medicaid or managed care program needed by the patient, have an available bed and are willing to accept the patient.  02/04/2014  Patient/family informed of [facility]' ownership interest in [facility], as well as of the fact that they are under no obligation to receive care at this facility.  PASARR submitted to EDS on 02/03/2014 PASARR number received on 02/03/2014  FL2 transmitted to all facilities in geographic area requested by pt/family on  02/04/2014 FL2 transmitted to all facilities within larger geographic area on   Patient informed that his/her managed care company has contracts with or will negotiate with  certain facilities, including the following:     Patient/family informed of bed offers received:  02/07/2014 Patient chooses bed at [facility] Physician recommends and patient chooses bed at    Patient to be transferred to [facility] on  02/10/2014 Patient to be transferred to facility by EMS Patient and family notified of transfer on 02/10/2014 Name of family member notified:  HUSBAND  The following physician request were entered in  Epic:   Additional Comments:  Jeanette Caprice, MSW, [place]

## 2014-02-10 NOTE — Discharge Summary (Addendum)
[name], is a 73 y.o. female  DOB 06/08/1941  MRN [id number].  Admission date:  02/03/2014  Admitting Physician  Mauri Pole, MD  Discharge Date:  02/10/2014   Primary MD  Tomasita Morrow, MD  Recommendations for primary care physician for things to follow:   Repeat CBC, BMP and chest x-ray in 3 days.   Admission Diagnosis  Hypokalemia [276.8] Diabetes mellitus without complication [382.50] High cholesterol [272.0] Closed fracture of right distal fibula [824.8] Essential hypertension [401.9] Dislocation of great toe, right, closed, initial encounter [838.09] Closed left subtrochanteric femur fracture, initial encounter [820.22]   Discharge Diagnosis  Hypokalemia [276.8] Diabetes mellitus without complication [539.76] High cholesterol [272.0] Closed fracture of right distal fibula [824.8] Essential hypertension [401.9] Dislocation of great toe, right, closed, initial encounter [838.09] Closed left subtrochanteric femur fracture, initial encounter [820.22]     Principal Problem:   Hip fracture Active Problems:   HTN (hypertension)   Diabetes mellitus without complication   High cholesterol   Hip fracture, left   Hypokalemia   Fibula fracture   MTP instability   Aortic stenosis      Past Medical History  Diagnosis Date  . HTN (hypertension)   . Diabetes mellitus without complication   . High cholesterol   . Spasm of back muscles     History reviewed. No pertinent past surgical history.     History of present illness and  Hospital Course:     Kindly see H&P for history of present illness and admission details, please review complete Labs, Consult reports and Test reports for all details in brief  HPI  from the history and physical done on the day of admission  HPI: [name] is a 73 y.o. female  with a past medical history that includes hypertension, diabetes, high cholesterol presents to the emergency department after a mechanical fall with the chief complaint of left hip pain. Initial evaluation reveals displaced subtrochanteric fracture left femur as well as nondisplaced fracture distal fibula and dislocated MTP.   She reports that she experienced a mechanical fall as she was turning around in the kitchen. She reports that she did strike her head but denies losing consciousness. She was unable to get up so her husband called EMs. She denies any dizziness chest pain palpitations shortness of breath prior to the fall. He denies any recent fever chills nausea vomiting unintentional weight loss. She does report that any movement of her left lower extremity worsens the pain in her hip.   Workup in the emergency department includes a basic metabolic panel significant for potassium of 2.9. Complete blood count with WBC 14.2 and serum glucose 114. CT of her head showed no acute abnormality. This x-ray shows cardiac enlargement without CHF or cardiopulmonary disease. EKG with borderline right axis deviation.     Hospital Course    1. Mech fall with left intertrochanteric fracture, right distal fibula and first metatarsophalangeal joint fracture.    Left intertrochanteric fracture - ORIF by Dr. Paralee Cancel on  02/04/2014    R ankle nondisplaced fracture - per orthopedics can be managed safely in closed fashion. Nonweightbearing to right leg and in splint bandage.    R hallux MPJ fracture / dislocation - ORIF 02-08-14 - [facility]    Weightbearing status is partial weight bearing on the left, no weight bearing on the right currently.        2. Mild aortic stenosis noted on echogram done in regional with nonspecific EKG changes. Seen by cardiologist Dr. Doylene Canard, will place her on her home dose aspirin 81 mg from today, no acute issues. Chest pain-free we'll follow with Dr. Doylene Canard  postdischarge.     3. Delirium. Minimize narcotics and benzodiazepines, QTC is stable, may use oral Haldol as needed . Fall and aspiration precautions. Family educated bedside. They are okay with restraints as needed. Delirium has improved.     4. Hypertension. Have added Coreg and increased home dose Norvasc for better control. Monitor blood pressures adjust medications as needed.    5. DM type II. Outpatient control poor, A1c greater than 11. Currently on Levemir 40 units twice a day along with sliding scale with stable control , monitor CBGs q. a.c. at bedtime adjust insulin as needed.    6. Dyslipidemia. Home dose statin continued.     7. Underlying chronic diastolic CHF with mild acute decompensation . Resolved after Low-dose Lasix, clinically euvolemic with no rales, placed on home dose HCTZ, continue fluid and salt restriction, monitor clinically. EF is 60% with mild aortic stenosis on echogram.     8. Lower extremity edema. At baseline now, diuresed with Lasix initially, now on home dose diuretic. Continue 1.5 L daily fluid restriction along with heart healthy low carb diet.    9. Mild leukocytosis. Initially thought to be due to leukocytosis which was treated with Levaquin, cultures negative, repeat UA stable, chest x-ray unremarkable, she is afebrile, this appears to be nonspecific and reactionary likely due to her fractures and surgeries. Will request nursing home M.D. to monitor temperature curve, clinically, repeat CBC BMP and a chest x-ray in 3 days.    10.GERD - On PPI     11. Diabetic neuropathy. On Neurontin..      12.Low K - replaced and stable      Discharge Condition: Stable   Follow UP  Follow-up Information   Follow up with [facility], MD. Schedule an appointment as soon as possible for a visit in 3 months.   Specialty:  Cardiology   Contact information:   [place] [address] [phone]       Follow up  with Mauri Pole, MD. Schedule an appointment as soon as possible for a visit in 2 weeks.   Specialty:  Orthopedic Surgery   Contact information:   [address] [place] [address] [phone]       Follow up with Wylene Simmer, MD. Schedule an appointment as soon as possible for a visit in 2 weeks. (For suture removal.  )    Specialty:  Orthopedic Surgery   Contact information:   [address] [place] [address] [phone]       Follow up with Tomasita Morrow, MD. Schedule an appointment as soon as possible for a visit in 3 days. (or SNF MD get CBC, BMP, chest x-ray checked)    Specialty:  Family Medicine   Contact information:   [place]  [address] [phone]  Discharge Instructions  and  Discharge Medications          Discharge Instructions   Discharge instructions    Complete by:  As directed   Schedule follow up appointment for either Wednesday or Friday to coordinate office visits with Alvan Dame and [facility]  May use Left lower extremity for transfers, Encourage daily exercise to prevent waisting during this cumbersome period of bilateral lower extremity trauma  Follow with Primary MD  in 7 days   Get CBC, CMP, 2 view Chest X ray checked  by Primary MD next visit.    Activity: No weight bearing right leg, partial weight bearing left leg, use Full fall precautions use walker/cane & assistance as needed   Disposition SNF rehabilitation   Diet: Heart Healthy - Low Carb feeding assistance and aspiration precautions if needed.  Accuchecks 4 times/day, Once in AM empty stomach and then before each meal. Log in all results and show them to your Prim.MD in 3 days. If any glucose reading is under 80 or above 300 call your Prim MD immidiately. Follow Low glucose instructions for glucose under 80 as instructed.   For Heart failure patients - Check your Weight same time everyday, if you gain over 2  pounds, or you develop in leg swelling, experience more shortness of breath or chest pain, call your Primary MD immediately. Follow Cardiac Low Salt Diet and 1.8 lit/day fluid restriction.   On your next visit with her primary care physician please Get Medicines reviewed and adjusted.  Please request your Prim.MD to go over all Hospital Tests and Procedure/Radiological results at the follow up, please get all Hospital records sent to your Prim MD by signing hospital release before you go home.   If you experience worsening of your admission symptoms, develop shortness of breath, life threatening emergency, suicidal or homicidal thoughts you must seek medical attention immediately by calling 911 or calling your MD immediately  if symptoms less severe.  You Must read complete instructions/literature along with all the possible adverse reactions/side effects for all the Medicines you take and that have been prescribed to you. Take any new Medicines after you have completely understood and accpet all the possible adverse reactions/side effects.   Do not drive, operating heavy machinery, perform activities at heights, swimming or participation in water activities or provide baby sitting services if your were admitted for syncope or siezures until you have seen by Primary MD or a Neurologist and advised to do so again.  Do not drive when taking Pain medications.    Do not take more than prescribed Pain, Sleep and Anxiety Medications  Special Instructions: If you have smoked or chewed Tobacco  in the last 2 yrs please stop smoking, stop any regular Alcohol  and or any Recreational drug use.  Wear Seat belts while driving.   Please note  You were cared for by a hospitalist during your hospital stay. If you have any questions about your discharge medications or the care you received while you were in the hospital after you are discharged, you can call the unit and asked to speak with the hospitalist  on call if the hospitalist that took care of you is not available. Once you are discharged, your primary care physician will handle any further medical issues. Please note that NO REFILLS for any discharge medications will be authorized once you are discharged, as it is imperative that you return to your primary care physician (or establish a relationship with a primary  care physician if you do not have one) for your aftercare needs so that they can reassess your need for medications and monitor your lab values  Schedule follow up appointment for either Wednesday or Friday to coordinate office visits with Alvan Dame and [facility]  May use Left lower extremity for transfers, Encourage daily exercise to prevent waisting during this cumbersome period of bilateral lower extremity trauma  Follow with Primary MD  in 7 days   Get CBC, CMP, 2 view Chest X ray checked  by Primary MD next visit.    Activity: No weight bearing right leg, partial weight bearing left leg, use Full fall precautions use walker/cane & assistance as needed   Disposition SNF rehabilitation   Diet: Heart Healthy - Low Carb feeding assistance and aspiration precautions if needed.  Accuchecks 4 times/day, Once in AM empty stomach and then before each meal. Log in all results and show them to your Prim.MD in 3 days. If any glucose reading is under 80 or above 300 call your Prim MD immidiately. Follow Low glucose instructions for glucose under 80 as instructed.   For Heart failure patients - Check your Weight same time everyday, if you gain over 2 pounds, or you develop in leg swelling, experience more shortness of breath or chest pain, call your Primary MD immediately. Follow Cardiac Low Salt Diet and 1.8 lit/day fluid restriction.   On your next visit with her primary care physician please Get Medicines reviewed and adjusted.  Please request your Prim.MD to go over all Hospital Tests and Procedure/Radiological results at the follow  up, please get all Hospital records sent to your Prim MD by signing hospital release before you go home.   If you experience worsening of your admission symptoms, develop shortness of breath, life threatening emergency, suicidal or homicidal thoughts you must seek medical attention immediately by calling 911 or calling your MD immediately  if symptoms less severe.  You Must read complete instructions/literature along with all the possible adverse reactions/side effects for all the Medicines you take and that have been prescribed to you. Take any new Medicines after you have completely understood and accpet all the possible adverse reactions/side effects.   Do not drive, operating heavy machinery, perform activities at heights, swimming or participation in water activities or provide baby sitting services if your were admitted for syncope or siezures until you have seen by Primary MD or a Neurologist and advised to do so again.  Do not drive when taking Pain medications.    Do not take more than prescribed Pain, Sleep and Anxiety Medications  Special Instructions: If you have smoked or chewed Tobacco  in the last 2 yrs please stop smoking, stop any regular Alcohol  and or any Recreational drug use.  Wear Seat belts while driving.   Please note  You were cared for by a hospitalist during your hospital stay. If you have any questions about your discharge medications or the care you received while you were in the hospital after you are discharged, you can call the unit and asked to speak with the hospitalist on call if the hospitalist that took care of you is not available. Once you are discharged, your primary care physician will handle any further medical issues. Please note that NO REFILLS for any discharge medications will be authorized once you are discharged, as it is imperative that you return to your primary care physician (or establish a relationship with a primary care physician if you do  not have one) for your aftercare needs so that they can reassess your need for medications and monitor your lab values     Partial weight bearing    Complete by:  As directed   For transfers.  % Body Weight:  50  Laterality:  bilateral  Extremity:  Lower            Medication List    STOP taking these medications       APIDRA 100 UNIT/ML injection  Generic drug:  insulin glulisine      TAKE these medications       amitriptyline 10 MG tablet  Commonly known as:  ELAVIL  Take 10 mg by mouth at bedtime.     amLODipine 10 MG tablet  Commonly known as:  NORVASC  Take 1 tablet (10 mg total) by mouth daily.     aspirin EC 81 MG tablet  Take 162 mg by mouth daily.     atorvastatin 20 MG tablet  Commonly known as:  LIPITOR  Take 20 mg by mouth at bedtime.     carvedilol 3.125 MG tablet  Commonly known as:  COREG  Take 1 tablet (3.125 mg total) by mouth 2 (two) times daily with a meal.     cetirizine 10 MG tablet  Commonly known as:  ZYRTEC  Take 10 mg by mouth daily as needed for allergies.     docusate sodium 100 MG capsule  Commonly known as:  COLACE  Take 100 mg by mouth 2 (two) times daily.     enoxaparin 40 MG/0.4ML injection  Commonly known as:  LOVENOX  Inject 0.4 mLs (40 mg total) into the skin daily.     fluticasone 50 MCG/ACT nasal spray  Commonly known as:  FLONASE  Place 1 spray into both nostrils daily.     gabapentin 300 MG capsule  Commonly known as:  NEURONTIN  Take 300 mg by mouth 2 (two) times daily.     hydrALAZINE 25 MG tablet  Commonly known as:  APRESOLINE  Take 1 tablet (25 mg total) by mouth 4 (four) times daily.     hydrocortisone 2.5 % rectal cream  Commonly known as:  ANUSOL-HC  Apply 1 application topically 4 (four) times daily.     insulin aspart 100 UNIT/ML injection  Commonly known as:  novoLOG  Inject 0-15 Units into the skin 3 (three) times daily with meals. Before each meal 3 times a day, 140-199 - 3 units, 200-250 - 5  units, 251-299 - 8 units,  300-349 - 12 units,  350 or above 15 units.     insulin detemir 100 UNIT/ML injection  Commonly known as:  LEVEMIR  Inject 0.4 mLs (40 Units total) into the skin 2 (two) times daily.     losartan-hydrochlorothiazide 100-25 MG per tablet  Commonly known as:  HYZAAR  Take 1 tablet by mouth daily.     meloxicam 15 MG tablet  Commonly known as:  MOBIC  Take 15 mg by mouth daily as needed for pain.     omeprazole 20 MG capsule  Commonly known as:  PRILOSEC  Take 20 mg by mouth daily.     oxyCODONE 5 MG immediate release tablet  Commonly known as:  Oxy IR/ROXICODONE  Take 1 tablet (5 mg total) by mouth every 4 (four) hours as needed for moderate pain or severe pain.     polyethylene glycol packet  Commonly known as:  MIRALAX / GLYCOLAX  Take 17 g  by mouth 2 (two) times daily.     Vitamin D (Ergocalciferol) 50000 UNITS Caps capsule  Commonly known as:  DRISDOL  Take 50,000 Units by mouth every 7 (seven) days. For 12 weeks then once a month.          Diet and Activity recommendation: See Discharge Instructions above   Consults obtained - Ortho, Cards   Major procedures and Radiology Reports - PLEASE review detailed and final reports for all details, in brief -      ORIF L Hip 02-04-14 Dr Alvan Dame    R hallux MPJ fracture / dislocation - to OR for ORIF 02-08-14 - [facility]     Echo Impressions:  Moderate to severe LVH with LVEF 40-98%, grade 1 diastolic dysfunction. Severe left atrial enlargement. 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 Unable to assess PASP    Dg Chest 1 View  02/03/2014   CLINICAL DATA:  Fall.  Hip injury  EXAM: CHEST - 1 VIEW  COMPARISON:  None.  FINDINGS: Cardiac enlargement without heart failure. Lungs are clear. No displaced rib fracture.  IMPRESSION: Cardiac enlargement.  No active cardiopulmonary disease.   Electronically Signed   By: Franchot Gallo M.D.   On: 02/03/2014 10:54   Dg  Hip Complete Left  02/03/2014   CLINICAL DATA:  Fall  EXAM: LEFT HIP - COMPLETE 2+ VIEW  COMPARISON:  None.  FINDINGS: Sub trochanteric fracture on the left with angulation and medial displacement. Left hip joint appears normal. No other fracture.  IMPRESSION: Displaced subtrochanteric fracture left femur   Electronically Signed   By: Franchot Gallo M.D.   On: 02/03/2014 10:55   Dg Femur Left  02/04/2014   CLINICAL DATA:  Intra medullary nail fixation  EXAM: LEFT FEMUR - 2 VIEW  COMPARISON:  02/03/2014  FINDINGS: Status post open reduction and internal fixation of a subtrochanteric left femur fracture. There is no evidence intra procedural complication. The hip is located.  IMPRESSION: Proximal left femur fracture ORIF.  No adverse findings.   Electronically Signed   By: Jorje Guild M.D.   On: 02/04/2014 23:47   Dg Ankle Complete Right  02/03/2014   CLINICAL DATA:  Fall  EXAM: RIGHT ANKLE - COMPLETE 3+ VIEW  COMPARISON:  None.  FINDINGS: Oblique nondisplaced fracture of the distal fibula. No fracture of the tibia. Ankle mortise intact.  Arterial calcification.  Calcaneal spurring.  IMPRESSION: Nondisplaced fracture distal fibula.   Electronically Signed   By: Franchot Gallo M.D.   On: 02/03/2014 10:59   Ct Head Wo Contrast  02/03/2014   CLINICAL DATA:  Fall  EXAM: CT HEAD WITHOUT CONTRAST  TECHNIQUE: Contiguous axial images were obtained from the base of the skull through the vertex without intravenous contrast.  COMPARISON:  None.  FINDINGS: No skull fracture is noted. The mastoid air cells are unremarkable. There is mucosal thickening with partial opacification left ethmoid air cells.  Mild cerebral atrophy. Mild periventricular white matter decreased attenuation probable due to chronic small vessel ischemic changes. No acute cortical infarction. No mass lesion is noted on this unenhanced scan.  IMPRESSION: No acute intracranial abnormality. Mild cerebral atrophy. Periventricular white matter decreased  attenuation probable due to chronic small vessel ischemic changes.   Electronically Signed   By: Lahoma Crocker M.D.   On: 02/03/2014 11:13   Dg Chest Port 1 View  02/10/2014   CLINICAL DATA:  Short of breath  EXAM: PORTABLE CHEST - 1 VIEW  COMPARISON:  Chest radiograph 02/05/2014  FINDINGS: Stable enlarged cardiac silhouette. Mild pulmonary edema pattern. There are small bilateral pleural effusions. No pneumothorax.  IMPRESSION: No interval change.  Cardiomegaly and mild pulmonary edema.   Electronically Signed   By: Suzy Bouchard M.D.   On: 02/10/2014 08:43   Dg Chest Port 1 View  02/05/2014   CLINICAL DATA:  Cough.  Shortness of breath.  But fracture.  EXAM: PORTABLE CHEST - 1 VIEW  COMPARISON:  One-view chest 02/03/2014.  FINDINGS: The heart is enlarged. Moderate pulmonary vascular congestion is now present. Mild atelectasis is evident at the bases. No significant airspace consolidation is present. The visualized soft tissues and bony thorax are unremarkable.  IMPRESSION: 1. Stable cardiomegaly with new moderate pulmonary vascular congestion. Early failure is considered. 2. Mild bibasilar atelectasis without other significant airspace disease.   Electronically Signed   By: Lawrence Santiago M.D.   On: 02/05/2014 08:05   Dg Knee Complete 4 Views Left  02/03/2014   CLINICAL DATA:  Fall  EXAM: LEFT KNEE - COMPLETE 4+ VIEW  COMPARISON:  None.  FINDINGS: There is no evidence of fracture, dislocation, or joint effusion. There is no evidence of arthropathy or other focal bone abnormality. Soft tissues are unremarkable.  IMPRESSION: Negative.   Electronically Signed   By: Franchot Gallo M.D.   On: 02/03/2014 10:58   Dg Ankle Right Port  02/05/2014   CLINICAL DATA:  Right fibula fracture.  EXAM: PORTABLE RIGHT ANKLE - 2 VIEW  COMPARISON:  02/03/2014.  FINDINGS: A nondisplaced oblique fracture of the distal fibula is again demonstrated. No displacement or angulation. Interval fiberglass cast.  IMPRESSION: Anatomic  position and alignment of the previously demonstrated distal fibular fracture with an interval cast.   Electronically Signed   By: Enrique Sack M.D.   On: 02/05/2014 08:55   Dg Foot 2 Views Right  02/05/2014   CLINICAL DATA:  Followup right first MTP fracture/dislocation.  EXAM: RIGHT FOOT - 2 VIEW  COMPARISON:  02/03/2014.  FINDINGS: Interval fiberglass cast extending to the level of the proximal first phalanx. There is continued dorsal and proximal dislocation of the first proximal phalanx relative to the first metatarsal with an associated mildly comminuted fracture of the first metatarsal head and displacement of two of the first metatarsal head fragments in conjunction with the proximal phalanx.  IMPRESSION: Stable fracture/dislocation at the first MTP joint, as described above   Electronically Signed   By: Enrique Sack M.D.   On: 02/05/2014 08:58   Dg Foot 2 Views Right  02/03/2014   CLINICAL DATA:  Postreduction.  EXAM: RIGHT FOOT - 2 VIEW  COMPARISON:  Right foot radiograph February 03, 2014 at 1018 hr.  FINDINGS: Suspected residual subluxation without frank dislocation of first metatarsophalangeal fracture dislocation seen on prior radiograph. Dorsal foot soft tissue swelling without subcutaneous gas radiopaque foreign bodies. Moderate vascular calcifications.  IMPRESSION: Improved alignment of first metatarsophalangeal fracture dislocation.   Electronically Signed   By: Elon Alas   On: 02/03/2014 12:33   Dg Foot Complete Right  02/03/2014   CLINICAL DATA:  Fall  EXAM: RIGHT FOOT COMPLETE - 3+ VIEW  COMPARISON:  None.  FINDINGS: Dorsal dislocation of the first metatarsophalangeal joint. Possible small avulsion fracture.  No other fracture.  Diffuse arterial calcification.  IMPRESSION: Dislocation of the first MTP   Electronically Signed   By: Franchot Gallo M.D.   On: 02/03/2014 10:57   Dg C-arm 61-120 Min-no Report  02/04/2014  CLINICAL DATA: intraop   C-ARM 61-120 MINUTES  Fluoroscopy was  utilized by the requesting physician.  No radiographic  interpretation.     Micro Results      Recent Results (from the past 240 hour(s))  MRSA PCR SCREENING     Status: None   Collection Time    02/03/14  7:03 PM      Result Value Ref Range Status   MRSA by PCR NEGATIVE  NEGATIVE Final   Comment:            The GeneXpert MRSA Assay (FDA     approved for NASAL specimens     only), is one component of a     comprehensive MRSA colonization     surveillance program. It is not     intended to diagnose MRSA     infection nor to guide or     monitor treatment for     MRSA infections.  URINE CULTURE     Status: None   Collection Time    02/07/14 11:33 PM      Result Value Ref Range Status   Specimen Description URINE, CLEAN CATCH   Final   Special Requests NONE   Final   Culture  Setup Time     Final   Value: 02/08/2014 04:02     Performed at [facility] Count     Final   Value: NO GROWTH     Performed at [facility]   Culture     Final   Value: NO GROWTH     Performed at [facility]   Report Status 02/09/2014 FINAL   Final       Today   Subjective:   [name] today has no headache,no chest abdominal pain,no new weakness tingling or numbness, feels much better.  Objective:   Blood pressure 150/53, pulse 102, temperature 99.6 F (37.6 C), temperature source Oral, resp. rate 18, height 5\' 5"  (1.651 m), weight 90.719 kg (200 lb), SpO2 95.00%.   Intake/Output Summary (Last 24 hours) at 02/10/14 1447 Last data filed at 02/10/14 1025  Gross per 24 hour  Intake    240 ml  Output      1 ml  Net    239 ml    Exam Awake Alert, Oriented x 2, No new F.N deficits, Normal affect [place].AT,PERRAL Supple Neck,No JVD, No cervical lymphadenopathy appriciated.  Symmetrical Chest wall movement, Good air movement bilaterally, CTAB RRR,No Gallops,Rubs or new Murmurs, No Parasternal Heave +ve B.Sounds, Abd Soft, Non tender, No organomegaly  appriciated, No rebound -guarding or rigidity. No Cyanosis, Clubbing or edema, No new Rash or bruise, R leg splint bandage  Data Review   CBC w Diff: Lab Results  Component Value Date   WBC 14.5* 02/10/2014   HGB 11.6* 02/10/2014   HCT 37.0 02/10/2014   PLT 207 02/10/2014   LYMPHOPCT 13 02/03/2014   MONOPCT 5 02/03/2014   EOSPCT 3 02/03/2014   BASOPCT 0 02/03/2014    CMP: Lab Results  Component Value Date   NA 135* 02/10/2014   K 3.8 02/10/2014   CL 92* 02/10/2014   CO2 32 02/10/2014   BUN 9 02/10/2014   CREATININE 0.63 02/10/2014  . Lab Results  Component Value Date   HGBA1C 11.8* 02/03/2014    CBG (last 3)   Recent Labs  02/10/14 0415 02/10/14 0759 02/10/14 1127  GLUCAP 125* 124* 131*      Total Time  in preparing paper work, data evaluation and todays exam - 35 minutes  Thurnell Lose M.D on 02/10/2014 at 2:47 PM  [facility] Office  [phone]   **Disclaimer: This note may have been dictated with voice recognition software. Similar sounding words can inadvertently be transcribed and this note may contain transcription errors which may not have been corrected upon publication of note.**

## 2014-02-11 ENCOUNTER — Encounter (HOSPITAL_COMMUNITY): Payer: Self-pay | Admitting: Emergency Medicine

## 2014-02-11 ENCOUNTER — Non-Acute Institutional Stay (SKILLED_NURSING_FACILITY): Payer: Medicare HMO | Admitting: Internal Medicine

## 2014-02-11 ENCOUNTER — Ambulatory Visit (HOSPITAL_COMMUNITY): Payer: Medicare HMO

## 2014-02-11 ENCOUNTER — Encounter (HOSPITAL_COMMUNITY): Payer: Self-pay | Admitting: Orthopedic Surgery

## 2014-02-11 ENCOUNTER — Inpatient Hospital Stay (HOSPITAL_COMMUNITY)
Admission: EM | Admit: 2014-02-11 | Discharge: 2014-02-13 | DRG: 189 | Disposition: A | Discharge status: Skilled Nursing Facility | Payer: Medicare HMO | Attending: Family Medicine | Admitting: Family Medicine

## 2014-02-11 ENCOUNTER — Inpatient Hospital Stay (HOSPITAL_COMMUNITY): Payer: Medicare HMO | Attending: Internal Medicine

## 2014-02-11 DIAGNOSIS — Z833 Family history of diabetes mellitus: Secondary | ICD-10-CM

## 2014-02-11 DIAGNOSIS — E872 Acidosis, unspecified: Secondary | ICD-10-CM | POA: Diagnosis present

## 2014-02-11 DIAGNOSIS — E119 Type 2 diabetes mellitus without complications: Secondary | ICD-10-CM

## 2014-02-11 DIAGNOSIS — M24873 Other specific joint derangements of unspecified ankle, not elsewhere classified: Secondary | ICD-10-CM

## 2014-02-11 DIAGNOSIS — Z882 Allergy status to sulfonamides status: Secondary | ICD-10-CM

## 2014-02-11 DIAGNOSIS — Z91013 Allergy to seafood: Secondary | ICD-10-CM

## 2014-02-11 DIAGNOSIS — Z880 Allergy status to penicillin: Secondary | ICD-10-CM

## 2014-02-11 DIAGNOSIS — S7222XD Displaced subtrochanteric fracture of left femur, subsequent encounter for closed fracture with routine healing: Secondary | ICD-10-CM

## 2014-02-11 DIAGNOSIS — I5031 Acute diastolic (congestive) heart failure: Secondary | ICD-10-CM | POA: Diagnosis present

## 2014-02-11 DIAGNOSIS — IMO0001 Reserved for inherently not codable concepts without codable children: Secondary | ICD-10-CM

## 2014-02-11 DIAGNOSIS — I359 Nonrheumatic aortic valve disorder, unspecified: Secondary | ICD-10-CM | POA: Diagnosis present

## 2014-02-11 DIAGNOSIS — S7290XD Unspecified fracture of unspecified femur, subsequent encounter for closed fracture with routine healing: Secondary | ICD-10-CM

## 2014-02-11 DIAGNOSIS — I10 Essential (primary) hypertension: Secondary | ICD-10-CM

## 2014-02-11 DIAGNOSIS — M25376 Other instability, unspecified foot: Secondary | ICD-10-CM

## 2014-02-11 DIAGNOSIS — R410 Disorientation, unspecified: Secondary | ICD-10-CM

## 2014-02-11 DIAGNOSIS — G9340 Encephalopathy, unspecified: Secondary | ICD-10-CM

## 2014-02-11 DIAGNOSIS — E876 Hypokalemia: Secondary | ICD-10-CM

## 2014-02-11 DIAGNOSIS — S7222XA Displaced subtrochanteric fracture of left femur, initial encounter for closed fracture: Secondary | ICD-10-CM

## 2014-02-11 DIAGNOSIS — M24876 Other specific joint derangements of unspecified foot, not elsewhere classified: Secondary | ICD-10-CM

## 2014-02-11 DIAGNOSIS — T40605A Adverse effect of unspecified narcotics, initial encounter: Secondary | ICD-10-CM | POA: Diagnosis present

## 2014-02-11 DIAGNOSIS — J9602 Acute respiratory failure with hypercapnia: Secondary | ICD-10-CM

## 2014-02-11 DIAGNOSIS — S72009D Fracture of unspecified part of neck of unspecified femur, subsequent encounter for closed fracture with routine healing: Secondary | ICD-10-CM

## 2014-02-11 DIAGNOSIS — G929 Unspecified toxic encephalopathy: Secondary | ICD-10-CM | POA: Diagnosis present

## 2014-02-11 DIAGNOSIS — S8290XD Unspecified fracture of unspecified lower leg, subsequent encounter for closed fracture with routine healing: Secondary | ICD-10-CM

## 2014-02-11 DIAGNOSIS — Z7982 Long term (current) use of aspirin: Secondary | ICD-10-CM

## 2014-02-11 DIAGNOSIS — G9341 Metabolic encephalopathy: Secondary | ICD-10-CM

## 2014-02-11 DIAGNOSIS — R400 Somnolence: Secondary | ICD-10-CM

## 2014-02-11 DIAGNOSIS — G92 Toxic encephalopathy: Secondary | ICD-10-CM | POA: Diagnosis present

## 2014-02-11 DIAGNOSIS — I509 Heart failure, unspecified: Secondary | ICD-10-CM | POA: Diagnosis present

## 2014-02-11 DIAGNOSIS — D72829 Elevated white blood cell count, unspecified: Secondary | ICD-10-CM | POA: Diagnosis present

## 2014-02-11 DIAGNOSIS — S82401D Unspecified fracture of shaft of right fibula, subsequent encounter for closed fracture with routine healing: Secondary | ICD-10-CM

## 2014-02-11 DIAGNOSIS — R5381 Other malaise: Secondary | ICD-10-CM

## 2014-02-11 DIAGNOSIS — E7800 Pure hypercholesterolemia, unspecified: Secondary | ICD-10-CM | POA: Diagnosis present

## 2014-02-11 DIAGNOSIS — J9600 Acute respiratory failure, unspecified whether with hypoxia or hypercapnia: Principal | ICD-10-CM | POA: Diagnosis present

## 2014-02-11 DIAGNOSIS — Z794 Long term (current) use of insulin: Secondary | ICD-10-CM

## 2014-02-11 DIAGNOSIS — Z91199 Patient's noncompliance with other medical treatment and regimen due to unspecified reason: Secondary | ICD-10-CM

## 2014-02-11 DIAGNOSIS — R5383 Other fatigue: Secondary | ICD-10-CM

## 2014-02-11 DIAGNOSIS — Z9119 Patient's noncompliance with other medical treatment and regimen: Secondary | ICD-10-CM

## 2014-02-11 LAB — GLUCOSE, CAPILLARY
GLUCOSE-CAPILLARY: 159 mg/dL — AB (ref 70–99)
Glucose-Capillary: 154 mg/dL — ABNORMAL HIGH (ref 70–99)
Glucose-Capillary: 321 mg/dL — ABNORMAL HIGH (ref 70–99)
Glucose-Capillary: 314 mg/dL — ABNORMAL HIGH (ref 70–99)
Glucose-Capillary: 291 mg/dL — ABNORMAL HIGH (ref 70–99)

## 2014-02-11 NOTE — Progress Notes (Signed)
Patient ID: [name], female   DOB: Nov 12, 1940, 73 y.o.   MRN: [id number]   This is an acute visit.  Level of care skilled.  Facility [facility].  Chief complaint acute visit status post hospitalization for left femur fracture complicated with right fibula fracture.  History of present illness.  Patient is a pleasant 73 year old female who experienced a fall while she was turning around in the [place] was unable to get up so her husband called EMS.  Workup in the ER showed a potassium of 2.9 and an elevated white count of 14.2.  CT of the head was negative for any acute process x-ray showed cardiac enlargement without CHF.  She was diagnosed with a left intertrochanteric fracture right distal fibula and first Metatarsal phalangeal joint fracture  The pressure was repaired in 02/04/2014.  Right ankle was a nondisplaced fracture and was deemed to be managed safely in a closed fashion with nonweightbearing to the right leg and in a splint bandage  In regards to the right calyx MPJ fracture or dislocation there was an ORIF done on July 7.  She is now partial weightbearing on the left no weightbearing on the right.  She was also diagnosed with mild aortic stenosis noted on a program-she was seen by cardiology and placed on her home dose of aspirin 81 mg Will follow up with cardiology post discharge.  She apparently also had delirium with orders to minimize narcotics and benzodiazepines-delirium apparently improved in the hospital-apparently there was some confusion earlier today but when I examined her this appeared to have improved fairly significantly -- by the end of the exam  Today  she appeared to be more bright alert interactive and appropriate  In regards to hypertension and aortic elevated readings in the hospital her Coreg was started in her Norvasc increased-blood pressure so far the facility appear to be stable--most recently 132/65  She also was found to be hypokalemic on  admission to the hospital at 2.9 this was supplemented potassium is 3.9 today.  In regards to leukocytosis apparently this was fairly chronic in the hospital is 14.8 today which is baseline with her admission white count-she was treated with Levaquin in the hospital but cultures were negative chest x-ray was unremarkable she remained afebrile this was thought to be possibly reactionary to her fractures and surgeries-  She does have a history of chronic diastolic CHF she did receive low-dose Lasix-on discharge was placed back on her home dose of hydrochlorothiazide-ejection fraction noted to be 60% with mild aortic stenosis.  .  Currently her vital signs are stable she does complain of pain at times and somewhat diffuse complaints at times of some shortness of breath and intermittent numbness at various sites.  She is on O2 via nasal cannula she states she had been on oxygen in the hospital as well.  Previous medical history.  History of left hip fracture surgically repaired.  History of right distal fibula fracture treated conservatively as noted above.  History of dislocation of great toe right that was surgically repaired as well.  History hypertension.  Hyperlipidemia.  Diabetes type 2.  Hypokalemia replenished.  Aortic stenosis.  History of back spasms.  History of diastolic CHF.    Also history no history of tobacco use or significant alcohol or illicit drug use she has been married for almost 50 years lives at home with her husband and has used a walker or cane for ambulation.  She has 2 children a son and a  daughter.  Family history significant for diabetes.  Medications.  Elavil 10 mg each bedtime.  Norvasc 10 mg daily.  Aspirin enteric-coated 162 mg daily.  Lipitor 20 mg each bedtime.  Zyrtec 10 mg daily when necessary.  Colace 100 mg twice a day.  Lovenox 40 mg daily.  Flonase one spray both nostrils daily.  Neurontin 300 mg twice a  day.  Hydralazine 25 mg 4 times a day.  Anusol one application 4 times a day.  NovoLog insulin sliding scale 3 times a day with meals.  Levimer 40 units twice a day.  Losartan- hydrochlorothiazide 100-25 mg dail  Mobic 50 mg daily when necessary.  Prilosec 20 mg daily.  OxyIR one tab every 4 hours when necessary.  MiraLax twice a day.  Vitamin D 50,000 units q. weekly x4 weeks and then q. monthly.  Review of systems.  In general does not complain of fever chills does complain somewhat of diffuse weakness.  Skin does not complaining of rash or itching.  Head ears eyes nose mouth and throat does not complaining of sore throat or visual changes  Respiratory-is on oxygen complains of some intermittent shortness of breath at times.     cardiac has what appears to be baseline edema per patient does not complain of chest pain  GI-does complain of constipation does not complaining of nausea vomiting or abdominal discomfort are  GU does not complain of dysuria.  Muscle skeletal does complain at times of some hip discomfort and leg discomfort.  Neurologic does not complaining of headache or dizziness or syncopal-type feelings--says  occasional numbness at surgical site  Psych-initially apparently some confusion but she appeared largely alert and oriented by the end of the exam was able to tell me the month specific date-appear to be accurate historian talking about her family and address.  To exam.  Temperature is 99.9 pulse 81 respirations 18 blood pressure 132/65-weight is 223 this appears to be down from yesterday.  In general this is a pleasant elderly female in no distress initially somewhat lethargic but appear to be considerably more alert by the end of the exam.  Her skin is warm and dry.--  Eyes-pupils equal round reactive to light--visual acuity appears grossly intact.  Oropharynx clear mucous membranes moist tongue is midline with full range of motion.  Chest  is clear to auscultation with somewhat reduced breath sounds there is no labored breathing she does have oxygen applied.  Heart is regular rate and rhythm with a systolic 2/6 murmur she has 1+ edema upper extremities and lower left leg-- right leg again  bandaging applied.  Abdomen obese soft nontender with positive bowel sounds.  Muscle skeletal does move her upper extremities at baseline with adequate grip strength --her extremities very limited secondary again to recent hip fracture on the left as well as fibula fracture on the right-- there is bandaging applied to the right lower leg-- her toes are exposed capillary refill is intact bilaterally lower extremities toes-  Surgical site left hip-- parts are covered per nursing there is no sign of infection drainage or bleeding-- exposed part appears to be quite benign with adhesive no surrounding drainage erythema or open areas  Neurologic appears grossly intact there are no lateralizing findings her speech is clear.  Psych she appears largely alert and oriented again was quite lethargic initially but this improved significantly during the course of the exam.  Labs.  02/11/2014.  WBC 14.8 hemoglobin 11.5 platelets 206.  Sodium 136 potassium 3.9  BUN 18 creatinine 0.94.  CO2 level was 34.  Assessment and plan.  #1 history of left hip fracture with repair-this will be followed by orthopedics she is on Lovenox for DVT prophylaxis-she is receiving oxycodone when necessary for pain also has Neurontin routinely in M[place] when necessary.  #2-history diabetes type 2 she is on Levemir 40 units twice a day as well as a sliding scale blood sugars today so far have been 80 in the morning 160 at noon-last night it 154.--At this point continue to monitor  #3history of right fibula fracture and right great toe fracture-as noted was surgically corrected she is on bandaging for the fibula fracture this is followed by orthopedics as well with  aforementioned pain control.  #4 history leukocytosis-this appears to be at baseline thought to be reactive she does have a slightly raised temperature will order a chest x-ray also a UA CNS and monitor vital signs closely clinically she appears to be stable. Also will order CBC with differential tomorrow to see where this is trending.  #5-hypertension-again we have minimal reading so far appear to be stable she is on Coreg hydralazine as well as combination of Losartan and hydrochlorothiazide  #6-history of hyperlipidemia she is on a statin at this point we'll defer aggressive workup secondary to expected short stay.  #7 history of allergic rhinitis she is on Flonase this appears to be stable.  #8 past history of constipation she is on Colace will order an abdominal x-ray to ensure stability here  History of hypokalemia-this was replenished as well order a metabolic panel on Monday to ensure stability.  #33-IDHWYSH of diastolic CHF-again she is back on the hydrochlorothiazide that was treated apparently for a short time with Lasix in hospital-she actually lost weight compared to yesterday but will continue to monitor weights to make sure of the accuracy-clinically she appears stable again we are getting a chest x-ray.  Of note at this point patient appears stable but will have to be monitored closely Will await results of chest x-ray and urine results as well as blood work Architectural technologist.  [id number] note greater than 45 minutes spent assessing patient-discussing her status with nursing-reviewing her chart-and coordinating and formulating a plan of care for numerous diagnoses-of note greater than 50% of time spent coordinating plan of care     Previous labs.... 02/10/2014.  WBC 14.5 hemoglobin 11.6.  Sodium 135 potassium 3.8 BUN 9 creatinine 0.63.  Hemoglobin A1c 11.8.   Of note.... later this evening patient was reassessed- her lethargy appeared to be increasing although she was  arousable with verbal and tactile stimulation -- she was also found to be diaphoretic-her vital signs remained stable with O2 saturations in the 90s-however concern here that her status is declining and will send to the ER for expedient evaluation-this was discussed with her daughter as well as with her husband via phone-   .      [name]  y

## 2014-02-11 NOTE — ED Notes (Signed)
Per Anne Ng at [facility], [place], [place] gave instructions for patient to come to ED for altered mental status; however, supervisor at [facility] states that patient was admitted to them with altered mental status on 02/10/14.

## 2014-02-12 ENCOUNTER — Emergency Department (HOSPITAL_COMMUNITY): Payer: Medicare HMO

## 2014-02-12 ENCOUNTER — Encounter (HOSPITAL_COMMUNITY): Payer: Self-pay | Admitting: Unknown Physician Specialty

## 2014-02-12 DIAGNOSIS — J9690 Respiratory failure, unspecified, unspecified whether with hypoxia or hypercapnia: Secondary | ICD-10-CM | POA: Insufficient documentation

## 2014-02-12 DIAGNOSIS — G9340 Encephalopathy, unspecified: Secondary | ICD-10-CM

## 2014-02-12 DIAGNOSIS — I5031 Acute diastolic (congestive) heart failure: Secondary | ICD-10-CM

## 2014-02-12 DIAGNOSIS — I509 Heart failure, unspecified: Secondary | ICD-10-CM

## 2014-02-12 DIAGNOSIS — G9341 Metabolic encephalopathy: Secondary | ICD-10-CM

## 2014-02-12 DIAGNOSIS — J9602 Acute respiratory failure with hypercapnia: Secondary | ICD-10-CM

## 2014-02-12 DIAGNOSIS — R4182 Altered mental status, unspecified: Secondary | ICD-10-CM | POA: Insufficient documentation

## 2014-02-12 DIAGNOSIS — E119 Type 2 diabetes mellitus without complications: Secondary | ICD-10-CM

## 2014-02-12 DIAGNOSIS — J9600 Acute respiratory failure, unspecified whether with hypoxia or hypercapnia: Principal | ICD-10-CM

## 2014-02-12 DIAGNOSIS — R404 Transient alteration of awareness: Secondary | ICD-10-CM

## 2014-02-12 LAB — DIFFERENTIAL
BASOS PCT: 0 % (ref 0–1)
Basophils Absolute: 0 10*3/uL (ref 0.0–0.1)
Eosinophils Absolute: 0.2 10*3/uL (ref 0.0–0.7)
Eosinophils Relative: 2 % (ref 0–5)
LYMPHS ABS: 1 10*3/uL (ref 0.7–4.0)
LYMPHS PCT: 7 % — AB (ref 12–46)
MONO ABS: 1.3 10*3/uL — AB (ref 0.1–1.0)
Monocytes Relative: 9 % (ref 3–12)
NEUTROS PCT: 82 % — AB (ref 43–77)
Neutro Abs: 11.8 10*3/uL — ABNORMAL HIGH (ref 1.7–7.7)

## 2014-02-12 LAB — BLOOD GAS, ARTERIAL
Acid-Base Excess: 9.2 mmol/L — ABNORMAL HIGH (ref 0.0–2.0)
Acid-Base Excess: 9.7 mmol/L — ABNORMAL HIGH (ref 0.0–2.0)
BICARBONATE: 35.4 meq/L — AB (ref 20.0–24.0)
Bicarbonate: 35.3 mEq/L — ABNORMAL HIGH (ref 20.0–24.0)
Drawn by: 38235
Drawn by: 38235
FIO2: 0.28 %
FIO2: 0.28 %
O2 Saturation: 95.1 %
O2 Saturation: 95.6 %
PATIENT TEMPERATURE: 37
PCO2 ART: 71.1 mmHg — AB (ref 35.0–45.0)
Patient temperature: 37
TCO2: 32.7 mmol/L (ref 0–100)
TCO2: 32.9 mmol/L (ref 0–100)
pCO2 arterial: 64.2 mmHg (ref 35.0–45.0)
pH, Arterial: 7.318 — ABNORMAL LOW (ref 7.350–7.450)
pH, Arterial: 7.359 (ref 7.350–7.450)
pO2, Arterial: 80.3 mmHg (ref 80.0–100.0)
pO2, Arterial: 85.8 mmHg (ref 80.0–100.0)

## 2014-02-12 LAB — URINALYSIS, ROUTINE W REFLEX MICROSCOPIC
Bilirubin Urine: NEGATIVE
GLUCOSE, UA: NEGATIVE mg/dL
Hgb urine dipstick: NEGATIVE
Ketones, ur: NEGATIVE mg/dL
LEUKOCYTES UA: NEGATIVE
Nitrite: NEGATIVE
Protein, ur: NEGATIVE mg/dL
Specific Gravity, Urine: 1.02 (ref 1.005–1.030)
UROBILINOGEN UA: 0.2 mg/dL (ref 0.0–1.0)
pH: 5 (ref 5.0–8.0)

## 2014-02-12 LAB — CBC
HEMATOCRIT: 34.9 % — AB (ref 36.0–46.0)
Hemoglobin: 11.2 g/dL — ABNORMAL LOW (ref 12.0–15.0)
MCH: 26.4 pg (ref 26.0–34.0)
MCHC: 32.1 g/dL (ref 30.0–36.0)
MCV: 82.3 fL (ref 78.0–100.0)
Platelets: 207 10*3/uL (ref 150–400)
RBC: 4.24 MIL/uL (ref 3.87–5.11)
RDW: 16 % — ABNORMAL HIGH (ref 11.5–15.5)
WBC: 14.3 10*3/uL — AB (ref 4.0–10.5)

## 2014-02-12 LAB — COMPREHENSIVE METABOLIC PANEL
ALBUMIN: 2.2 g/dL — AB (ref 3.5–5.2)
ALK PHOS: 70 U/L (ref 39–117)
ALT: 13 U/L (ref 0–35)
ANION GAP: 7 (ref 5–15)
AST: 14 U/L (ref 0–37)
BILIRUBIN TOTAL: 0.6 mg/dL (ref 0.3–1.2)
BUN: 20 mg/dL (ref 6–23)
CHLORIDE: 92 meq/L — AB (ref 96–112)
CO2: 35 meq/L — AB (ref 19–32)
CREATININE: 0.95 mg/dL (ref 0.50–1.10)
Calcium: 9.3 mg/dL (ref 8.4–10.5)
GFR calc Af Amer: 68 mL/min — ABNORMAL LOW (ref >90)
GFR, EST NON AFRICAN AMERICAN: 59 mL/min — AB (ref >90)
GLUCOSE: 238 mg/dL — AB (ref 70–99)
POTASSIUM: 4.1 meq/L (ref 3.7–5.3)
Sodium: 134 mEq/L — ABNORMAL LOW (ref 137–147)
Total Protein: 6.2 g/dL (ref 6.0–8.3)

## 2014-02-12 LAB — TROPONIN I
Troponin I: <0.3 ng/mL (ref <0.30)
Troponin I: <0.3 ng/mL (ref <0.30)
Troponin I: <0.3 ng/mL (ref <0.30)

## 2014-02-12 LAB — PRO B NATRIURETIC PEPTIDE: PRO B NATRI PEPTIDE: 348 pg/mL — AB (ref 0–125)

## 2014-02-12 LAB — ETHANOL

## 2014-02-12 LAB — RAPID URINE DRUG SCREEN, HOSP PERFORMED
Amphetamines: NOT DETECTED
Barbiturates: NOT DETECTED
Benzodiazepines: NOT DETECTED
Cocaine: NOT DETECTED
Opiates: NOT DETECTED
Tetrahydrocannabinol: NOT DETECTED

## 2014-02-12 LAB — GLUCOSE, CAPILLARY
GLUCOSE-CAPILLARY: 223 mg/dL — AB (ref 70–99)
GLUCOSE-CAPILLARY: 276 mg/dL — AB (ref 70–99)
Glucose-Capillary: 312 mg/dL — ABNORMAL HIGH (ref 70–99)
Glucose-Capillary: 248 mg/dL — ABNORMAL HIGH (ref 70–99)

## 2014-02-12 LAB — APTT: APTT: 40 s — AB (ref 24–37)

## 2014-02-12 LAB — MRSA PCR SCREENING: MRSA by PCR: NEGATIVE

## 2014-02-12 LAB — PROTIME-INR
INR: 1.11 (ref 0.00–1.49)
Prothrombin Time: 14.3 seconds (ref 11.6–15.2)

## 2014-02-12 MED ORDER — ONDANSETRON HCL 4 MG/2ML IJ SOLN: 4.0000 mg | Freq: Four times a day (QID) | INTRAMUSCULAR | Status: DC | PRN

## 2014-02-12 MED ORDER — CARVEDILOL 3.125 MG PO TABS
3.1250 mg | ORAL_TABLET | Freq: Two times a day (BID) | ORAL | Status: DC
  Administered 2014-02-12 – 2014-02-13 (×3): 3.125 mg via ORAL
  Filled 2014-02-12 (×3): qty 1

## 2014-02-12 MED ORDER — MELOXICAM 7.5 MG PO TABS
15.0000 mg | ORAL_TABLET | Freq: Every day | ORAL | Status: DC
  Administered 2014-02-12 – 2014-02-13 (×2): 15 mg via ORAL
  Filled 2014-02-12: qty 2
  Filled 2014-02-12 (×2): qty 1
  Filled 2014-02-12 (×2): qty 2
  Filled 2014-02-12: qty 1

## 2014-02-12 MED ORDER — OXYCODONE HCL 5 MG PO TABS: 5.0000 mg | ORAL_TABLET | ORAL | Status: DC | PRN

## 2014-02-12 MED ORDER — POLYETHYLENE GLYCOL 3350 17 G PO PACK
17.0000 g | PACK | Freq: Two times a day (BID) | ORAL | Status: DC
  Administered 2014-02-12 – 2014-02-13 (×2): 17 g via ORAL
  Filled 2014-02-12 (×3): qty 1

## 2014-02-12 MED ORDER — ASPIRIN EC 81 MG PO TBEC
162.0000 mg | DELAYED_RELEASE_TABLET | Freq: Every day | ORAL | Status: DC
  Administered 2014-02-12 – 2014-02-13 (×2): 162 mg via ORAL
  Filled 2014-02-12 (×2): qty 2

## 2014-02-12 MED ORDER — AMLODIPINE BESYLATE 5 MG PO TABS
10.0000 mg | ORAL_TABLET | Freq: Every day | ORAL | Status: DC
  Administered 2014-02-12 – 2014-02-13 (×2): 10 mg via ORAL
  Filled 2014-02-12 (×2): qty 2

## 2014-02-12 MED ORDER — SODIUM CHLORIDE 0.9 % IJ SOLN
3.0000 mL | Freq: Two times a day (BID) | INTRAMUSCULAR | Status: DC
  Administered 2014-02-12 – 2014-02-13 (×3): 3 mL via INTRAVENOUS

## 2014-02-12 MED ORDER — SODIUM CHLORIDE 0.9 % IJ SOLN: 3.0000 mL | INTRAMUSCULAR | Status: DC | PRN

## 2014-02-12 MED ORDER — ACETAMINOPHEN 325 MG PO TABS
650.0000 mg | ORAL_TABLET | Freq: Four times a day (QID) | ORAL | Status: DC
  Administered 2014-02-12 – 2014-02-13 (×6): 650 mg via ORAL
  Filled 2014-02-12 (×6): qty 2

## 2014-02-12 MED ORDER — INSULIN ASPART 100 UNIT/ML ~~LOC~~ SOLN
0.0000 [IU] | Freq: Every day | SUBCUTANEOUS | Status: DC
  Administered 2014-02-12: 3 [IU] via SUBCUTANEOUS

## 2014-02-12 MED ORDER — FUROSEMIDE 10 MG/ML IJ SOLN
20.0000 mg | Freq: Two times a day (BID) | INTRAMUSCULAR | Status: DC
Start: 2014-02-12 — End: 2014-02-12
  Administered 2014-02-12: 20 mg via INTRAVENOUS
  Filled 2014-02-12: qty 2

## 2014-02-12 MED ORDER — FUROSEMIDE 40 MG PO TABS
40.0000 mg | ORAL_TABLET | Freq: Every day | ORAL | Status: DC
  Administered 2014-02-13: 40 mg via ORAL
  Filled 2014-02-12: qty 1

## 2014-02-12 MED ORDER — LORATADINE 10 MG PO TABS
10.0000 mg | ORAL_TABLET | Freq: Every day | ORAL | Status: DC
  Administered 2014-02-12 – 2014-02-13 (×2): 10 mg via ORAL
  Filled 2014-02-12 (×2): qty 1

## 2014-02-12 MED ORDER — LOSARTAN POTASSIUM 50 MG PO TABS
100.0000 mg | ORAL_TABLET | Freq: Every day | ORAL | Status: DC
  Administered 2014-02-12 – 2014-02-13 (×2): 100 mg via ORAL
  Filled 2014-02-12 (×2): qty 2

## 2014-02-12 MED ORDER — DOCUSATE SODIUM 100 MG PO CAPS
100.0000 mg | ORAL_CAPSULE | Freq: Two times a day (BID) | ORAL | Status: DC
  Administered 2014-02-12 – 2014-02-13 (×2): 100 mg via ORAL
  Filled 2014-02-12 (×3): qty 1

## 2014-02-12 MED ORDER — PANTOPRAZOLE SODIUM 40 MG PO TBEC
40.0000 mg | DELAYED_RELEASE_TABLET | Freq: Every day | ORAL | Status: DC
  Administered 2014-02-12 – 2014-02-13 (×2): 40 mg via ORAL
  Filled 2014-02-12 (×2): qty 1

## 2014-02-12 MED ORDER — INSULIN DETEMIR 100 UNIT/ML ~~LOC~~ SOLN
40.0000 [IU] | Freq: Two times a day (BID) | SUBCUTANEOUS | Status: DC
  Administered 2014-02-12 – 2014-02-13 (×3): 40 [IU] via SUBCUTANEOUS
  Filled 2014-02-12 (×5): qty 0.4

## 2014-02-12 MED ORDER — SODIUM CHLORIDE 0.9 % IV SOLN: 250.0000 mL | INTRAVENOUS | Status: DC | PRN

## 2014-02-12 MED ORDER — ENOXAPARIN SODIUM 40 MG/0.4ML ~~LOC~~ SOLN
40.0000 mg | SUBCUTANEOUS | Status: DC
  Administered 2014-02-12 – 2014-02-13 (×2): 40 mg via SUBCUTANEOUS
  Filled 2014-02-12 (×2): qty 0.4

## 2014-02-12 MED ORDER — ATORVASTATIN CALCIUM 20 MG PO TABS
20.0000 mg | ORAL_TABLET | Freq: Every day | ORAL | Status: DC
  Administered 2014-02-12: 20 mg via ORAL
  Filled 2014-02-12: qty 1

## 2014-02-12 MED ORDER — INSULIN ASPART 100 UNIT/ML ~~LOC~~ SOLN
0.0000 [IU] | Freq: Three times a day (TID) | SUBCUTANEOUS | Status: DC
  Administered 2014-02-12: 3 [IU] via SUBCUTANEOUS

## 2014-02-12 MED ORDER — HYDRALAZINE HCL 25 MG PO TABS
25.0000 mg | ORAL_TABLET | Freq: Four times a day (QID) | ORAL | Status: DC
  Administered 2014-02-12 – 2014-02-13 (×6): 25 mg via ORAL
  Filled 2014-02-12 (×6): qty 1

## 2014-02-12 MED ORDER — INSULIN ASPART 100 UNIT/ML ~~LOC~~ SOLN
0.0000 [IU] | Freq: Three times a day (TID) | SUBCUTANEOUS | Status: DC
  Administered 2014-02-12: 11 [IU] via SUBCUTANEOUS
  Administered 2014-02-12: 5 [IU] via SUBCUTANEOUS
  Administered 2014-02-13: 3 [IU] via SUBCUTANEOUS
  Administered 2014-02-13: 09:00:00 via SUBCUTANEOUS

## 2014-02-12 MED ORDER — HYDROCORTISONE 2.5 % RE CREA
1.0000 "application " | TOPICAL_CREAM | Freq: Four times a day (QID) | RECTAL | Status: DC
  Administered 2014-02-12 – 2014-02-13 (×6): 1 via TOPICAL
  Filled 2014-02-12: qty 28.35

## 2014-02-12 MED ORDER — ONDANSETRON HCL 4 MG PO TABS: 4.0000 mg | ORAL_TABLET | Freq: Four times a day (QID) | ORAL | Status: DC | PRN

## 2014-02-12 MED ORDER — FLUTICASONE PROPIONATE 50 MCG/ACT NA SUSP
1.0000 | Freq: Every day | NASAL | Status: DC
  Administered 2014-02-12 – 2014-02-13 (×2): 1 via NASAL
  Filled 2014-02-12: qty 16

## 2014-02-12 MED ORDER — GABAPENTIN 300 MG PO CAPS
300.0000 mg | ORAL_CAPSULE | Freq: Two times a day (BID) | ORAL | Status: DC
  Administered 2014-02-12 – 2014-02-13 (×3): 300 mg via ORAL
  Filled 2014-02-12 (×3): qty 1

## 2014-02-12 NOTE — ED Notes (Signed)
I&O cath completed by Novella Olive, NT; assisted by Kayleen Memos, RN.  Urine sent to lab per order.

## 2014-02-12 NOTE — Consult Note (Signed)
Consult requested by: [facility] Consult requested for respiratory failure:  HPI: This is a 73 year old who had a fall with injuries to her hip ankle and toe. She had surgery for that and was discharged to a skilled care facility on the ninth of this month but became confused and short of breath and was brought to the emergency department for evaluation. She was found to have hypercapnia and appeared to have congestive heart failure. She is not known to have had previous similar episodes. She says she feels better now  Past Medical History  Diagnosis Date  . HTN (hypertension)   . Diabetes mellitus without complication   . High cholesterol   . Spasm of back muscles      Family History  Problem Relation Age of Onset  . Diabetes Other      History   Social History  . Marital Status: Married    Spouse Name: N/A    Number of Children: N/A  . Years of Education: N/A   Social History Main Topics  . Smoking status: Never Smoker   . Smokeless tobacco: Never Used  . Alcohol Use: No  . Drug Use: No  . Sexual Activity: No   Other Topics Concern  . None   Social History Narrative  . None     ROS: She denies any chest pain. She's not had hemoptysis. She's not noticed any ankle swelling. No cough or congestion.    Objective: Vital signs in last 24 hours: Temp:  [98.1 F (36.7 C)-99.9 F (37.7 C)] 98.3 F (36.8 C) (07/11 0648) Pulse Rate:  [74-91] 91 (07/11 1005) Resp:  [14-23] 18 (07/11 1005) BP: (104-132)/(36-93) 104/50 mmHg (07/11 1005) SpO2:  [94 %-97 %] 95 % (07/11 1005) Weight:  [104.2 kg (229 lb 11.5 oz)] 104.2 kg (229 lb 11.5 oz) (07/11 0648) Weight change:  Last BM Date:  (unknown)  Intake/Output from previous day:    PHYSICAL EXAM She is awake and alert. He is in no acute distress. She's on nasal oxygen now. Her neck is supple without masses. HEENT exam is unremarkable. Her chest is relatively clear she still has some rales in both bases. Her heart is  regular without gallop. Her abdomen is soft without masses. Her extremities show that she has some generalized puffiness. Her right lower extremity is wrapped central nervous system exam shows she is intact  Lab Results: Basic Metabolic Panel:  Recent Labs  02/10/14 0522 02/12/14 0102  NA 135* 134*  K 3.8 4.1  CL 92* 92*  CO2 32 35*  GLUCOSE 119* 238*  BUN 9 20  CREATININE 0.63 0.95  CALCIUM 9.4 9.3   Liver Function Tests:  Recent Labs  02/12/14 0102  AST 14  ALT 13  ALKPHOS 70  BILITOT 0.6  PROT 6.2  ALBUMIN 2.2*   No results found for this basename: LIPASE, AMYLASE,  in the last 72 hours No results found for this basename: AMMONIA,  in the last 72 hours CBC:  Recent Labs  02/10/14 0522 02/12/14 0102  WBC 14.5* 14.3*  NEUTROABS  --  11.8*  HGB 11.6* 11.2*  HCT 37.0 34.9*  MCV 82.8 82.3  PLT 207 207   Cardiac Enzymes:  Recent Labs  02/12/14 0102 02/12/14 0733  TROPONINI <0.30 <0.30   BNP:  Recent Labs  02/12/14 0058  PROBNP 348.0*   D-Dimer: No results found for this basename: DDIMER,  in the last 72 hours CBG:  Recent Labs  02/10/14 1648 02/10/14  2102 02/11/14 2001 02/11/14 2126 02/11/14 2301 02/12/14 0720  GLUCAP 154* 159* 321* 314* 291* 223*   Hemoglobin A1C: No results found for this basename: HGBA1C,  in the last 72 hours Fasting Lipid Panel: No results found for this basename: CHOL, HDL, LDLCALC, TRIG, CHOLHDL, LDLDIRECT,  in the last 72 hours Thyroid Function Tests: No results found for this basename: TSH, T4TOTAL, FREET4, T3FREE, THYROIDAB,  in the last 72 hours Anemia Panel: No results found for this basename: VITAMINB12, FOLATE, FERRITIN, TIBC, IRON, RETICCTPCT,  in the last 72 hours Coagulation:  Recent Labs  02/12/14 0102  LABPROT 14.3  INR 1.11   Urine Drug Screen: Drugs of Abuse     Component Value Date/Time   LABOPIA NONE DETECTED 02/12/2014 0110   COCAINSCRNUR NONE DETECTED 02/12/2014 0110   LABBENZ NONE  DETECTED 02/12/2014 0110   AMPHETMU NONE DETECTED 02/12/2014 0110   THCU NONE DETECTED 02/12/2014 0110   LABBARB NONE DETECTED 02/12/2014 0110    Alcohol Level:  Recent Labs  02/12/14 0102  ETH <11   Urinalysis:  Recent Labs  02/12/14 0110  COLORURINE YELLOW  LABSPEC 1.020  PHURINE 5.0  GLUCOSEU NEGATIVE  HGBUR NEGATIVE  BILIRUBINUR NEGATIVE  KETONESUR NEGATIVE  PROTEINUR NEGATIVE  UROBILINOGEN 0.2  NITRITE NEGATIVE  LEUKOCYTESUR NEGATIVE   Misc. Labs:   ABGS:  Recent Labs  02/12/14 0301  PHART 7.318*  PO2ART 85.8  TCO2 32.9  HCO3 35.4*     MICROBIOLOGY: Recent Results (from the past 240 hour(s))  MRSA PCR SCREENING     Status: None   Collection Time    02/03/14  7:03 PM      Result Value Ref Range Status   MRSA by PCR NEGATIVE  NEGATIVE Final   Comment:            The GeneXpert MRSA Assay (FDA     approved for NASAL specimens     only), is one component of a     comprehensive MRSA colonization     surveillance program. It is not     intended to diagnose MRSA     infection nor to guide or     monitor treatment for     MRSA infections.  URINE CULTURE     Status: None   Collection Time    02/07/14 11:33 PM      Result Value Ref Range Status   Specimen Description URINE, CLEAN CATCH   Final   Special Requests NONE   Final   Culture  Setup Time     Final   Value: 02/08/2014 04:02     Performed at [facility]     Final   Value: NO GROWTH     Performed at [facility]   Culture     Final   Value: NO GROWTH     Performed at [facility]   Report Status 02/09/2014 FINAL   Final    Studies/Results: Dg Chest 1 View  02/11/2014   CLINICAL DATA:  Chest pain.  EXAM: CHEST - 1 VIEW  COMPARISON:  Single view of the chest 02/10/2014 and 02/05/2014.  FINDINGS: There is cardiomegaly and mild vascular congestion. There is a small right pleural effusion and bibasilar subsegmental atelectasis.  IMPRESSION: New small right  pleural effusion.  Cardiomegaly and vascular congestion.   Electronically Signed   By: Inge Rise M.D.   On: 02/11/2014 21:45   Dg Abd 1 View  02/11/2014  CLINICAL DATA:  Left lower quadrant abdominal pain.  EXAM: ABDOMEN - 1 VIEW  COMPARISON:  None.  FINDINGS: The bowel gas pattern is normal. No radio-opaque calculi or other significant radiographic abnormality are seen.  IMPRESSION: Negative.   Electronically Signed   By: Inge Rise M.D.   On: 02/11/2014 21:48   Ct Head Wo Contrast  02/12/2014   CLINICAL DATA:  Altered mental status. Recent hospital discharge for hip fracture.  EXAM: CT HEAD WITHOUT CONTRAST  TECHNIQUE: Contiguous axial images were obtained from the base of the skull through the vertex without intravenous contrast.  COMPARISON:  CT of the head February 03, 2014  FINDINGS: The ventricles and sulci are normal for age. No intraparenchymal hemorrhage, mass effect nor midline shift. Patchy supratentorial white matter hypodensities are within normal range for patient's age and though non-specific suggest sequelae of chronic small vessel ischemic disease. No acute large vascular territory infarcts.  No abnormal extra-axial fluid collections. Basal cisterns are patent. Moderate calcific atherosclerosis of the carotid siphons.  No skull fracture. Osteopenia. The included ocular globes and orbital contents are non-suspicious. Status post bilateral ocular lens implants The mastoid aircells and included paranasal sinuses are well-aerated.  IMPRESSION: No acute intracranial process.  Stable appearance of the head from February 03, 2014: Involutional changes. Mild white matter changes suggest chronic small vessel ischemic disease.   Electronically Signed   By: Elon Alas   On: 02/12/2014 00:47   Dg Chest Portable 1 View  02/12/2014   CLINICAL DATA:  Dyspnea.  EXAM: PORTABLE CHEST - 1 VIEW  COMPARISON:  Chest radiograph February 11, 2014  FINDINGS: The cardiac silhouette appears similarly enlarged  with central pulmonary vasculature congestion. Slightly increasing bibasilar airspace opacities with small pleural effusions. No pneumothorax. Soft tissue planes and included osseous structures are nonsuspicious.  IMPRESSION: Stable cardiomegaly with central pulmonary vasculature congestion ; increasing bibasilar airspace opacities may reflect confluent edema with small pleural effusions.   Electronically Signed   By: Elon Alas   On: 02/12/2014 03:57    Medications:  Prior to Admission:  Prescriptions prior to admission  Medication Sig Dispense Refill  . amitriptyline (ELAVIL) 10 MG tablet Take 10 mg by mouth at bedtime.      [name] amLODipine (NORVASC) 10 MG tablet Take 1 tablet (10 mg total) by mouth daily.      [name] aspirin EC 81 MG tablet Take 162 mg by mouth daily.      [name] atorvastatin (LIPITOR) 20 MG tablet Take 20 mg by mouth at bedtime.      . carvedilol (COREG) 3.125 MG tablet Take 1 tablet (3.125 mg total) by mouth 2 (two) times daily with a meal.      . cetirizine (ZYRTEC) 10 MG tablet Take 10 mg by mouth daily as needed for allergies.      [name] docusate sodium (COLACE) 100 MG capsule Take 100 mg by mouth 2 (two) times daily.      [name] enoxaparin (LOVENOX) 40 MG/0.4ML injection Inject 0.4 mLs (40 mg total) into the skin daily.  14 Syringe  0  . fluticasone (FLONASE) 50 MCG/ACT nasal spray Place 1 spray into both nostrils daily.      [name] gabapentin (NEURONTIN) 300 MG capsule Take 300 mg by mouth 2 (two) times daily.      . hydrALAZINE (APRESOLINE) 25 MG tablet Take 1 tablet (25 mg total) by mouth 4 (four) times daily.      . hydrocortisone (ANUSOL-HC) 2.5 % rectal cream  Apply 1 application topically 4 (four) times daily.      . insulin aspart (NOVOLOG) 100 UNIT/ML injection Inject 0-15 Units into the skin 3 (three) times daily with meals. Before each meal 3 times a day, 140-199 - 3 units, 200-250 - 5 units, 251-299 - 8 units,  300-349 - 12 units,  350 or above 15 units.  10 mL  11  . insulin  detemir (LEVEMIR) 100 UNIT/ML injection Inject 0.4 mLs (40 Units total) into the skin 2 (two) times daily.  10 mL  11  . losartan-hydrochlorothiazide (HYZAAR) 100-25 MG per tablet Take 1 tablet by mouth daily.      . meloxicam (MOBIC) 15 MG tablet Take 15 mg by mouth daily as needed for pain.      [name] omeprazole (PRILOSEC) 20 MG capsule Take 20 mg by mouth daily.      [name] oxyCODONE (OXY IR/ROXICODONE) 5 MG immediate release tablet Take 1 tablet (5 mg total) by mouth every 4 (four) hours as needed for moderate pain or severe pain.  20 tablet  0  . polyethylene glycol (MIRALAX / GLYCOLAX) packet Take 17 g by mouth 2 (two) times daily.  14 each  0  . Vitamin D, Ergocalciferol, (DRISDOL) 50000 UNITS CAPS capsule Take 50,000 Units by mouth every 7 (seven) days. For 12 weeks then once a month.       Scheduled: . acetaminophen  650 mg Oral Q6H  . amLODipine  10 mg Oral Daily  . aspirin EC  162 mg Oral Daily  . atorvastatin  20 mg Oral QHS  . carvedilol  3.125 mg Oral BID WC  . docusate sodium  100 mg Oral BID  . enoxaparin (LOVENOX) injection  40 mg Subcutaneous Q24H  . fluticasone  1 spray Each Nare Daily  . [START ON 02/13/2014] furosemide  40 mg Oral Daily  . gabapentin  300 mg Oral BID  . hydrALAZINE  25 mg Oral QID  . hydrocortisone  1 application Topical QID  . insulin aspart  0-15 Units Subcutaneous TID WC  . insulin aspart  0-5 Units Subcutaneous QHS  . insulin detemir  40 Units Subcutaneous BID  . loratadine  10 mg Oral Daily  . losartan  100 mg Oral Daily  . meloxicam  15 mg Oral Daily  . pantoprazole  40 mg Oral Daily  . polyethylene glycol  17 g Oral BID  . sodium chloride  3 mL Intravenous Q12H   Continuous:  MEQ:ASTMHD chloride, ondansetron (ZOFRAN) IV, ondansetron, sodium chloride  Assesment: She is admitted with acute hypercapnic respiratory failure that I think is mostly related to CHF she has improved. She's now on nasal oxygen. Workup for CHF is underway Principal Problem:    Acute respiratory failure with hypercapnia Active Problems:   Closed left subtrochanteric femur fracture   HTN (hypertension)   Diabetes mellitus without complication   Acute encephalopathy   Acute diastolic CHF (congestive heart failure)    Plan: Continue current treatments. Thanks for allowing me to see her with you    LOS: 1 day   Valery Amedee L 02/12/2014, 10:16 AM

## 2014-02-12 NOTE — ED Notes (Signed)
ABG results called by Bridgette, RT.

## 2014-02-12 NOTE — Progress Notes (Addendum)
PROGRESS NOTE  [name]:[id number] DOB: 07-29-1941 DOA: 02/11/2014 PCP: Tomasita Morrow, MD Radiologist: Dr. Doylene Canard   Summary: 73 year old woman recently underwent hip fracture reduction and internal fixation as well as right first toe fracture/dislocation repair, discharge to skilled nursing facility 7/9, who was reported to have a delirium type symptoms at rehabilitation thought secondary to anesthesia and pain medications. At that place 7/10 she was thought to be more confused and having difficulty breathing. In the emergency department ABG revealed hypercapnia and she was referred for admission. Home medications included oxycodone. She was admitted for acute respiratory failure, possible CHF  Assessment/Plan: 1. Acute respiratory failure with respiratory acidosis, hypercapnia. Clinically resolved, appears stable off BiPAP at this point. Suspect related to narcotics, possible acute CHF decompensation. 2. Acute encephalopathy. Appears resolved. Thought to be related to pain medications. Minimize narcotics and avoid benzodiazepines. 3. Possible acute diastolic congestive heart failure. Troponins negative thus far. No evidence of significant volume overload at this point. Appears to be stabilized. 4. Leukocytosis. Likely related to fracture or may be a chronic process. No evidence of infection at this point. Monitor clinically. 5. DM type 2. Capillary blood sugars elevated. 6. History of mechanical fall with left hip fracture surgically repaired 7/3. Continue Lovenox for DVT prophylaxis. 7. History of right nondisplaced ankle fracture, per orthopedics nonweightbearing right leg, continue splint.  8. History of dislocation of great toe right that was surgically repaired. 9. Aortic stenosis.   Overall improved. Continue nasal cannula today, wean as tolerated. Monitor for decompensation. Avoid narcotics if possible, no benzodiazepines, continue diuresis.  Strict I/O, change to oral  diuretics, cycle troponins  Diabetes regimen, increase SSI to moderate  Partial weightbearing on the left no weightbearing on the right  Discussed with husband by telephone improvement in respiratory status, likely secondary to narcotics, possible heart failure; plan to monitor and likely return to skilled nursing facility 7/12.  Discussed above with RN.  Code Status: full code DVT prophylaxis: Lovenox Family Communication:  Disposition Plan: return to [place], [place]  [facility]  Pager [phone] If 7PM-7AM, please contact night-coverage at [email or web address], password [facility] 02/12/2014, 7:49 AM  LOS: 1 day   Consultants:  Pulmonology  Procedures:    Antibiotics:    HPI/Subjective: On BiPAP last night, this AM on [place]. No new issues per RN.  Patient   Objective: Filed Vitals:   02/12/14 0128 02/12/14 0339 02/12/14 0552 02/12/14 0648  BP:   128/58   Pulse:   74   Temp: 98.1 F (36.7 C)   98.3 F (36.8 C)  TempSrc:    Oral  Resp:  23 20   Height:    5\' 5"  (1.651 m)  Weight:    104.2 kg (229 lb 11.5 oz)  SpO2:  94% 96%    No intake or output data in the 24 hours ending 02/12/14 0749   Filed Weights   02/12/14 0648  Weight: 104.2 kg (229 lb 11.5 oz)    Exam:     Afebrile, vital signs stable. On [place].  Gen. Appears calm and comfortable, lying flat.  Psych. Speech fluent and clear. Grossly normal mentation. Oriented to self, location, month, year.  Eyes. Grossly unremarkable  Cardiovascular. Regular rate and rhythm. No murmur, rub gallop. No lower extremity edema. Telemetry sinus rhythm.  Respiratory. Clear to auscultation bilaterally. No wheezes, rales or rhonchi. Normal respiratory effort.  Abdomen. Soft.  Musculoskeletal. Right leg with splint. Moves both legs to command.  Neurologic. Grossly nonfocal.  Data Reviewed: I/O: no I/O recorded  Chemistry: Basic metabolic panel with hypercarbia on admission. ProBNP minimally  elevated. Urine negative.  Heme: Hemoglobin stable. CBC without significant change.  ID: Urinalysis unremarkable. No cultures obtained.  Other: ABG with mild acidosis, moderate hypercapnia. EKG normal sinus rhythm, nonspecific ST changes, compared to previous study 02/04/2014, no acute changes.  Imaging: Chest x-ray were stable cardiomegaly and central pulmonary vascular congestion, possibly increasing edema.  Scheduled Meds: . amLODipine  10 mg Oral Daily  . aspirin EC  162 mg Oral Daily  . atorvastatin  20 mg Oral QHS  . carvedilol  3.125 mg Oral BID WC  . docusate sodium  100 mg Oral BID  . enoxaparin (LOVENOX) injection  40 mg Subcutaneous Q24H  . fluticasone  1 spray Each Nare Daily  . furosemide  20 mg Intravenous Q12H  . gabapentin  300 mg Oral BID  . hydrALAZINE  25 mg Oral QID  . hydrocortisone  1 application Topical QID  . insulin aspart  0-9 Units Subcutaneous TID WC  . insulin detemir  40 Units Subcutaneous BID  . loratadine  10 mg Oral Daily  . losartan  100 mg Oral Daily  . meloxicam  15 mg Oral Daily  . pantoprazole  40 mg Oral Daily  . polyethylene glycol  17 g Oral BID  . sodium chloride  3 mL Intravenous Q12H   Continuous Infusions:   Principal Problem:   Acute respiratory failure with hypercapnia Active Problems:   Closed left subtrochanteric femur fracture   HTN (hypertension)   Diabetes mellitus without complication   Acute encephalopathy   Acute diastolic CHF (congestive heart failure)   Time spent 25 minutes

## 2014-02-12 NOTE — ED Provider Notes (Signed)
CSN: [id number]     Arrival date & time 02/11/14  2335 History   First MD Initiated Contact with Patient 02/12/14 0009    This chart was scribed for [name] Cable, MD by Terressa Koyanagi, ED Scribe. This patient was seen in room APA15/[facility]15 and the patient's care was started at 12:10 AM.  Chief Complaint  Patient presents with  . Altered Mental Status   LEVEL 5 CAVEAT - Altered Mental Status The history is provided by the patient. No language interpreter was used.  PCP: Tomasita Morrow, MD  HPI Comments: [name] is a 73 y.o. female, with a history of HTN, DM, and high cholesterol, who presents to the Emergency Department complaining of altered mental status. Onset is unknown.  Nothing improves her symptoms, nothing worsens her symptoms.   Pt with recent admission following fall with left hip fracture, and is now in nursing facility.  Apparently pt has had worsening mental status (confusion) since admission.  She was noted to be diaphoretic and concern for possible infection  On arrival, pt is not able to fully contribute to history as she is very drowsy    Past Medical History  Diagnosis Date  . HTN (hypertension)   . Diabetes mellitus without complication   . High cholesterol   . Spasm of back muscles    Past Surgical History  Procedure Laterality Date  . Intramedullary (im) nail intertrochanteric Left 02/04/2014    Procedure: INTRAMEDULLARY (IM) NAIL INTERTROCHANTRIC FEMORAL;  Surgeon: Mauri Pole, MD;  Location: [facility];  Service: Orthopedics;  Laterality: Left;  . Orif toe fracture Right 02/08/2014    Procedure: OPEN REDUCTION INTERNAL FIXATION Right METATARSAL  FRACTURE ;  Surgeon: Wylene Simmer, MD;  Location: [facility];  Service: Orthopedics;  Laterality: Right;   Family History  Problem Relation Age of Onset  . Diabetes Other    History  Substance Use Topics  . Smoking status: Never Smoker   . Smokeless tobacco: Never Used  . Alcohol Use: No   OB History   Grav Para  Term Preterm Abortions TAB SAB Ect Mult Living   6 2 2  4  4   2      Review of Systems  Unable to perform ROS: Mental status change   Allergies  Iodine; Penicillins; Shellfish allergy; and Sulfa antibiotics  Home Medications   Prior to Admission medications   Medication Sig Start Date End Date Taking? Authorizing Provider  amitriptyline (ELAVIL) 10 MG tablet Take 10 mg by mouth at bedtime.    Historical Provider, MD  amLODipine (NORVASC) 10 MG tablet Take 1 tablet (10 mg total) by mouth daily. 02/10/14   Thurnell Lose, MD  aspirin EC 81 MG tablet Take 162 mg by mouth daily.    Historical Provider, MD  atorvastatin (LIPITOR) 20 MG tablet Take 20 mg by mouth at bedtime.    Historical Provider, MD  carvedilol (COREG) 3.125 MG tablet Take 1 tablet (3.125 mg total) by mouth 2 (two) times daily with a meal. 02/10/14   Thurnell Lose, MD  cetirizine (ZYRTEC) 10 MG tablet Take 10 mg by mouth daily as needed for allergies.    Historical Provider, MD  docusate sodium (COLACE) 100 MG capsule Take 100 mg by mouth 2 (two) times daily.    Historical Provider, MD  enoxaparin (LOVENOX) 40 MG/0.4ML injection Inject 0.4 mLs (40 mg total) into the skin daily. 02/10/14   Lucille Passy Babish, PA-C  fluticasone (FLONASE) 50 MCG/ACT nasal spray  Place 1 spray into both nostrils daily.    Historical Provider, MD  gabapentin (NEURONTIN) 300 MG capsule Take 300 mg by mouth 2 (two) times daily.    Historical Provider, MD  hydrALAZINE (APRESOLINE) 25 MG tablet Take 1 tablet (25 mg total) by mouth 4 (four) times daily. 02/09/14   Thurnell Lose, MD  hydrocortisone (ANUSOL-HC) 2.5 % rectal cream Apply 1 application topically 4 (four) times daily.    Historical Provider, MD  insulin aspart (NOVOLOG) 100 UNIT/ML injection Inject 0-15 Units into the skin 3 (three) times daily with meals. Before each meal 3 times a day, 140-199 - 3 units, 200-250 - 5 units, 251-299 - 8 units,  300-349 - 12 units,  350 or above 15 units.  02/09/14   Thurnell Lose, MD  insulin detemir (LEVEMIR) 100 UNIT/ML injection Inject 0.4 mLs (40 Units total) into the skin 2 (two) times daily. 02/09/14   Thurnell Lose, MD  losartan-hydrochlorothiazide (HYZAAR) 100-25 MG per tablet Take 1 tablet by mouth daily.    Historical Provider, MD  meloxicam (MOBIC) 15 MG tablet Take 15 mg by mouth daily as needed for pain.    Historical Provider, MD  omeprazole (PRILOSEC) 20 MG capsule Take 20 mg by mouth daily.    Historical Provider, MD  oxyCODONE (OXY IR/ROXICODONE) 5 MG immediate release tablet Take 1 tablet (5 mg total) by mouth every 4 (four) hours as needed for moderate pain or severe pain. 02/09/14   Thurnell Lose, MD  polyethylene glycol (MIRALAX / GLYCOLAX) packet Take 17 g by mouth 2 (two) times daily. 02/09/14   Thurnell Lose, MD  Vitamin D, Ergocalciferol, (DRISDOL) 50000 UNITS CAPS capsule Take 50,000 Units by mouth every 7 (seven) days. For 12 weeks then once a month.    Historical Provider, MD   Triage Vitals: BP 112/40  Pulse 78  Temp(Src) 98.1 F (36.7 C) (Oral)  Resp 18  SpO2 97% Physical Exam CONSTITUTIONAL: somnolent but easily arousable  HEAD: Normocephalic/atraumatic EYES: EOMI/PERRL ENMT: Mucous membranes moist NECK: supple no meningeal signs SPINE:entire spine nontender CV: S1/S2 noted, murmur noted LUNGS: coarse breath sounds bilaterally  ABDOMEN: soft, nontender, no rebound or guarding GU:no cva tenderness NEURO: pt is somnolent but arousable, follows commands and answers questions appropriately, no motor deficit to either UE, LE neuro exam limited due to recent injuries  EXTREMITIES: splint noted RLE; healing incision to left hip without erythema or drainage.  SKIN: warm, color normal PSYCH: unable to fully assess  ED Course  Procedures  CRITICAL CARE Performed by: [name] Total critical care time: 31 Critical care time was exclusive of separately billable procedures and treating other  patients. Critical care was necessary to treat or prevent imminent or life-threatening deterioration. Critical care was time spent personally by me on the following activities: development of treatment plan with patient and/or surrogate as well as nursing, discussions with consultants, evaluation of patient's response to treatment, examination of patient, obtaining history from patient or surrogate, ordering and performing treatments and interventions, ordering and review of laboratory studies, ordering and review of radiographic studies, pulse oximetry and re-evaluation of patient's condition.  DIAGNOSTIC STUDIES: Oxygen Saturation is 97% on RA, normal by my interpretation.    12:27 AM D/w Alene Mires, PA for patient at nursing facility She seemed more "lethargic" and also diaphoretic. He was concerned for possible infection She has had episodes of AMS even prior to tonight, but he wanted to make sure she did not have new  infection Surgical wound looks without infection Will screen for uti (already had negative CXR) Will also obtain CT head If all tests unremarkable, may be amenable for d/c back to [facility] This may also be related to pain meds/neurontin 1:42 AM Pt responding to nursing staff ABG abnormal (hypercarbia) but she has normal pH Suspect this may be chronic Workup pending at this time 5:26 AM D/w family.  They report ever since surgery she has had episodes of confusion, usually related to pain medications.  She is still having episodes of drowsiness but will respond to voice Repeat ABG reveals worsening hypercarbia/acidosis and I am concerned this could worsen, and may be related to her pain meds Patient was placed on bipap to assist in ventilation for her hypercarbia D/w dr Darrick Meigs, will admit to stepdown unit  Labs Review Labs Reviewed  APTT - Abnormal; Notable for the following:    aPTT 40 (*)    All other components within normal limits  CBC - Abnormal; Notable for the  following:    WBC 14.3 (*)    Hemoglobin 11.2 (*)    HCT 34.9 (*)    RDW 16.0 (*)    All other components within normal limits  DIFFERENTIAL - Abnormal; Notable for the following:    Neutrophils Relative % 82 (*)    Neutro Abs 11.8 (*)    Lymphocytes Relative 7 (*)    Monocytes Absolute 1.3 (*)    All other components within normal limits  COMPREHENSIVE METABOLIC PANEL - Abnormal; Notable for the following:    Sodium 134 (*)    Chloride 92 (*)    CO2 35 (*)    Glucose, Bld 238 (*)    Albumin 2.2 (*)    GFR calc non Af Amer 59 (*)    GFR calc Af Amer 68 (*)    All other components within normal limits  BLOOD GAS, ARTERIAL - Abnormal; Notable for the following:    pCO2 arterial 64.2 (*)    Bicarbonate 35.3 (*)    Acid-Base Excess 9.7 (*)    All other components within normal limits  BLOOD GAS, ARTERIAL - Abnormal; Notable for the following:    pH, Arterial 7.318 (*)    pCO2 arterial 71.1 (*)    Bicarbonate 35.4 (*)    Acid-Base Excess 9.2 (*)    All other components within normal limits  ETHANOL  PROTIME-INR  URINE RAPID DRUG SCREEN (HOSP PERFORMED)  URINALYSIS, ROUTINE W REFLEX MICROSCOPIC  TROPONIN I  PRO B NATRIURETIC PEPTIDE    Imaging Review Dg Chest 1 View  02/11/2014   CLINICAL DATA:  Chest pain.  EXAM: CHEST - 1 VIEW  COMPARISON:  Single view of the chest 02/10/2014 and 02/05/2014.  FINDINGS: There is cardiomegaly and mild vascular congestion. There is a small right pleural effusion and bibasilar subsegmental atelectasis.  IMPRESSION: New small right pleural effusion.  Cardiomegaly and vascular congestion.   Electronically Signed   By: Inge Rise M.D.   On: 02/11/2014 21:45   Dg Abd 1 View  02/11/2014   CLINICAL DATA:  Left lower quadrant abdominal pain.  EXAM: ABDOMEN - 1 VIEW  COMPARISON:  None.  FINDINGS: The bowel gas pattern is normal. No radio-opaque calculi or other significant radiographic abnormality are seen.  IMPRESSION: Negative.   Electronically  Signed   By: Inge Rise M.D.   On: 02/11/2014 21:48   Ct Head Wo Contrast  02/12/2014   CLINICAL DATA:  Altered mental status. Recent  hospital discharge for hip fracture.  EXAM: CT HEAD WITHOUT CONTRAST  TECHNIQUE: Contiguous axial images were obtained from the base of the skull through the vertex without intravenous contrast.  COMPARISON:  CT of the head February 03, 2014  FINDINGS: The ventricles and sulci are normal for age. No intraparenchymal hemorrhage, mass effect nor midline shift. Patchy supratentorial white matter hypodensities are within normal range for patient's age and though non-specific suggest sequelae of chronic small vessel ischemic disease. No acute large vascular territory infarcts.  No abnormal extra-axial fluid collections. Basal cisterns are patent. Moderate calcific atherosclerosis of the carotid siphons.  No skull fracture. Osteopenia. The included ocular globes and orbital contents are non-suspicious. Status post bilateral ocular lens implants The mastoid aircells and included paranasal sinuses are well-aerated.  IMPRESSION: No acute intracranial process.  Stable appearance of the head from February 03, 2014: Involutional changes. Mild white matter changes suggest chronic small vessel ischemic disease.   Electronically Signed   By: Elon Alas   On: 02/12/2014 00:47   Dg Chest Portable 1 View  02/12/2014   CLINICAL DATA:  Dyspnea.  EXAM: PORTABLE CHEST - 1 VIEW  COMPARISON:  Chest radiograph February 11, 2014  FINDINGS: The cardiac silhouette appears similarly enlarged with central pulmonary vasculature congestion. Slightly increasing bibasilar airspace opacities with small pleural effusions. No pneumothorax. Soft tissue planes and included osseous structures are nonsuspicious.  IMPRESSION: Stable cardiomegaly with central pulmonary vasculature congestion ; increasing bibasilar airspace opacities may reflect confluent edema with small pleural effusions.   Electronically Signed   By:  Elon Alas   On: 02/12/2014 03:57   Dg Chest Port 1 View  02/10/2014   CLINICAL DATA:  Short of breath  EXAM: PORTABLE CHEST - 1 VIEW  COMPARISON:  Chest radiograph 02/05/2014  FINDINGS: Stable enlarged cardiac silhouette. Mild pulmonary edema pattern. There are small bilateral pleural effusions. No pneumothorax.  IMPRESSION: No interval change.  Cardiomegaly and mild pulmonary edema.   Electronically Signed   By: Suzy Bouchard M.D.   On: 02/10/2014 08:43     EKG Interpretation   Date/Time:  Saturday February 12 2014 00:29:58 EDT Ventricular Rate:  75 PR Interval:  150 QRS Duration: 76 QT Interval:  335 QTC Calculation: 374 R Axis:   78 Text Interpretation:  Sinus rhythm Nonspecific T abnormalities, diffuse  leads No significant change since last tracing Confirmed by Christy Gentles  MD,  [place] ([id number]) on 02/12/2014 1:04:19 AM      MDM   Final diagnoses:  Acute respiratory failure with hypercapnia  Delirium    Nursing notes including past medical history and social history reviewed and considered in documentation Labs/vital reviewed and considered Previous records reviewed and considered   I personally performed the services described in this documentation, which was scribed in my presence. The recorded information has been reviewed and is accurate.      [name] Cable, MD 02/12/14 [phone]

## 2014-02-12 NOTE — H&P (Signed)
PCP:   Tomasita Morrow, MD   Chief Complaint:  Altered mental status  HPI:  73 year old female who  has a past medical history of HTN (hypertension); Diabetes mellitus without complication; High cholesterol; and Spasm of back muscles. Today was brought to the ED from [facility] and her after patient was found to be confused, also patient had shortness of breath. Patient underwent ORIF left intertrochanteric fracture on 02/04/2014, she also underwent right hallux MP J fracture/dislocation on 02/08/2014. Since then patient has been very confused as per family, though there are some lucid intervals in between. This was thought to be secondary to effect of anesthesia as well as pain medications. Patient has been in the [facility] for rehabilitation, and today patient was seen by PA who thought the patient was not breathing right, and had worsening mental status. In the ED patient was found to be in somnolence, with ABG revealing hypercarbia. Patient was put on BiPAP and her mental status and breathing has improved. She denies chest pain, no nausea vomiting diarrhea. No headache no blurred vision. No fever.  Allergies:   Allergies  Allergen Reactions  . Iodine Anaphylaxis  . Penicillins Anaphylaxis  . Shellfish Allergy Anaphylaxis  . Sulfa Antibiotics Anaphylaxis      Past Medical History  Diagnosis Date  . HTN (hypertension)   . Diabetes mellitus without complication   . High cholesterol   . Spasm of back muscles     Past Surgical History  Procedure Laterality Date  . Intramedullary (im) nail intertrochanteric Left 02/04/2014    Procedure: INTRAMEDULLARY (IM) NAIL INTERTROCHANTRIC FEMORAL;  Surgeon: Mauri Pole, MD;  Location: [facility];  Service: Orthopedics;  Laterality: Left;  . Orif toe fracture Right 02/08/2014    Procedure: OPEN REDUCTION INTERNAL FIXATION Right METATARSAL  FRACTURE ;  Surgeon: Wylene Simmer, MD;  Location: [facility];  Service: Orthopedics;  Laterality: Right;     Prior to Admission medications   Medication Sig Start Date End Date Taking? Authorizing Provider  amitriptyline (ELAVIL) 10 MG tablet Take 10 mg by mouth at bedtime.    Historical Provider, MD  amLODipine (NORVASC) 10 MG tablet Take 1 tablet (10 mg total) by mouth daily. 02/10/14   Thurnell Lose, MD  aspirin EC 81 MG tablet Take 162 mg by mouth daily.    Historical Provider, MD  atorvastatin (LIPITOR) 20 MG tablet Take 20 mg by mouth at bedtime.    Historical Provider, MD  carvedilol (COREG) 3.125 MG tablet Take 1 tablet (3.125 mg total) by mouth 2 (two) times daily with a meal. 02/10/14   Thurnell Lose, MD  cetirizine (ZYRTEC) 10 MG tablet Take 10 mg by mouth daily as needed for allergies.    Historical Provider, MD  docusate sodium (COLACE) 100 MG capsule Take 100 mg by mouth 2 (two) times daily.    Historical Provider, MD  enoxaparin (LOVENOX) 40 MG/0.4ML injection Inject 0.4 mLs (40 mg total) into the skin daily. 02/10/14   Lucille Passy Babish, PA-C  fluticasone (FLONASE) 50 MCG/ACT nasal spray Place 1 spray into both nostrils daily.    Historical Provider, MD  gabapentin (NEURONTIN) 300 MG capsule Take 300 mg by mouth 2 (two) times daily.    Historical Provider, MD  hydrALAZINE (APRESOLINE) 25 MG tablet Take 1 tablet (25 mg total) by mouth 4 (four) times daily. 02/09/14   Thurnell Lose, MD  hydrocortisone (ANUSOL-HC) 2.5 % rectal cream Apply 1 application topically 4 (four) times daily.  Historical Provider, MD  insulin aspart (NOVOLOG) 100 UNIT/ML injection Inject 0-15 Units into the skin 3 (three) times daily with meals. Before each meal 3 times a day, 140-199 - 3 units, 200-250 - 5 units, 251-299 - 8 units,  300-349 - 12 units,  350 or above 15 units. 02/09/14   Thurnell Lose, MD  insulin detemir (LEVEMIR) 100 UNIT/ML injection Inject 0.4 mLs (40 Units total) into the skin 2 (two) times daily. 02/09/14   Thurnell Lose, MD  losartan-hydrochlorothiazide (HYZAAR) 100-25 MG per tablet  Take 1 tablet by mouth daily.    Historical Provider, MD  meloxicam (MOBIC) 15 MG tablet Take 15 mg by mouth daily as needed for pain.    Historical Provider, MD  omeprazole (PRILOSEC) 20 MG capsule Take 20 mg by mouth daily.    Historical Provider, MD  oxyCODONE (OXY IR/ROXICODONE) 5 MG immediate release tablet Take 1 tablet (5 mg total) by mouth every 4 (four) hours as needed for moderate pain or severe pain. 02/09/14   Thurnell Lose, MD  polyethylene glycol (MIRALAX / GLYCOLAX) packet Take 17 g by mouth 2 (two) times daily. 02/09/14   Thurnell Lose, MD  Vitamin D, Ergocalciferol, (DRISDOL) 50000 UNITS CAPS capsule Take 50,000 Units by mouth every 7 (seven) days. For 12 weeks then once a month.    Historical Provider, MD    Social History:  reports that she has never smoked. She has never used smokeless tobacco. She reports that she does not drink alcohol or use illicit drugs.  Family History  Problem Relation Age of Onset  . Diabetes Other      All the positives are listed in BOLD  Review of Systems:  HEENT: Headache, blurred vision, runny nose, sore throat Neck: Hypothyroidism, hyperthyroidism,,lymphadenopathy Chest : Shortness of breath, history of COPD, Asthma Heart : Chest pain, history of coronary arterey disease GI:  Nausea, vomiting, diarrhea, constipation, GERD GU: Dysuria, urgency, frequency of urination, hematuria Neuro: Stroke, seizures, syncope Psych: Depression, anxiety, hallucinations   Physical Exam: Blood pressure 112/40, pulse 78, temperature 98.1 F (36.7 C), temperature source Oral, resp. rate 23, SpO2 94.00%. Constitutional:   Patient is a well-developed and well-nourished female* in no acute distress and cooperative with exam. Head: Normocephalic and atraumatic Mouth: Mucus membranes moist Eyes: PERRL, EOMI, conjunctivae normal Neck: Supple, No Thyromegaly Cardiovascular: RRR, S1 normal, S2 normal Pulmonary/Chest: Decreased breath sounds  bilaterally Abdominal: Soft. Non-tender, non-distended, bowel sounds are normal, no masses, organomegaly, or guarding present.  Neurological: A&O x2, she is oriented to person, time not oriented to place Strenght is normal and symmetric bilaterally, cranial nerve II-XII are grossly intact, no focal motor deficit, sensory intact to light touch bilaterally.  Extremities : No Cyanosis, Clubbing or Edema  Labs on Admission:  Basic Metabolic Panel:  Recent Labs Lab 02/07/14 0500 02/08/14 0548 02/09/14 1315 02/10/14 0522 02/12/14 0102  NA 137 141 136* 135* 134*  K 4.5 4.2 4.5 3.8 4.1  CL 95* 97 94* 92* 92*  CO2 34* 36* 32 32 35*  GLUCOSE 180* 154* 234* 119* 238*  BUN 9 9 10 9 20   CREATININE 0.63 0.56 0.70 0.63 0.95  CALCIUM 8.9 8.9 8.9 9.4 9.3   Liver Function Tests:  Recent Labs Lab 02/12/14 0102  AST 14  ALT 13  ALKPHOS 70  BILITOT 0.6  PROT 6.2  ALBUMIN 2.2*   No results found for this basename: LIPASE, AMYLASE,  in the last 168 hours No results found  for this basename: AMMONIA,  in the last 168 hours CBC:  Recent Labs Lab 02/07/14 0500 02/08/14 0548 02/09/14 1315 02/10/14 0522 02/12/14 0102  WBC 12.1* 10.0 14.1* 14.5* 14.3*  NEUTROABS  --   --   --   --  11.8*  HGB 11.9* 11.5* 11.4* 11.6* 11.2*  HCT 38.3 37.7 35.9* 37.0 34.9*  MCV 84.2 85.3 83.3 82.8 82.3  PLT 138* 174 202 207 207   Cardiac Enzymes:  Recent Labs Lab 02/12/14 0102  TROPONINI <0.30    BNP (last 3 results) No results found for this basename: PROBNP,  in the last 8760 hours CBG:  Recent Labs Lab 02/10/14 1648 02/10/14 2102 02/11/14 2001 02/11/14 2126 02/11/14 2301  GLUCAP 154* 159* 321* 314* 291*    Radiological Exams on Admission: Dg Chest 1 View  02/11/2014   CLINICAL DATA:  Chest pain.  EXAM: CHEST - 1 VIEW  COMPARISON:  Single view of the chest 02/10/2014 and 02/05/2014.  FINDINGS: There is cardiomegaly and mild vascular congestion. There is a small right pleural effusion  and bibasilar subsegmental atelectasis.  IMPRESSION: New small right pleural effusion.  Cardiomegaly and vascular congestion.   Electronically Signed   By: Inge Rise M.D.   On: 02/11/2014 21:45   Dg Abd 1 View  02/11/2014   CLINICAL DATA:  Left lower quadrant abdominal pain.  EXAM: ABDOMEN - 1 VIEW  COMPARISON:  None.  FINDINGS: The bowel gas pattern is normal. No radio-opaque calculi or other significant radiographic abnormality are seen.  IMPRESSION: Negative.   Electronically Signed   By: Inge Rise M.D.   On: 02/11/2014 21:48   Ct Head Wo Contrast  02/12/2014   CLINICAL DATA:  Altered mental status. Recent hospital discharge for hip fracture.  EXAM: CT HEAD WITHOUT CONTRAST  TECHNIQUE: Contiguous axial images were obtained from the base of the skull through the vertex without intravenous contrast.  COMPARISON:  CT of the head February 03, 2014  FINDINGS: The ventricles and sulci are normal for age. No intraparenchymal hemorrhage, mass effect nor midline shift. Patchy supratentorial white matter hypodensities are within normal range for patient's age and though non-specific suggest sequelae of chronic small vessel ischemic disease. No acute large vascular territory infarcts.  No abnormal extra-axial fluid collections. Basal cisterns are patent. Moderate calcific atherosclerosis of the carotid siphons.  No skull fracture. Osteopenia. The included ocular globes and orbital contents are non-suspicious. Status post bilateral ocular lens implants The mastoid aircells and included paranasal sinuses are well-aerated.  IMPRESSION: No acute intracranial process.  Stable appearance of the head from February 03, 2014: Involutional changes. Mild white matter changes suggest chronic small vessel ischemic disease.   Electronically Signed   By: Elon Alas   On: 02/12/2014 00:47   Dg Chest Portable 1 View  02/12/2014   CLINICAL DATA:  Dyspnea.  EXAM: PORTABLE CHEST - 1 VIEW  COMPARISON:  Chest radiograph February 11, 2014  FINDINGS: The cardiac silhouette appears similarly enlarged with central pulmonary vasculature congestion. Slightly increasing bibasilar airspace opacities with small pleural effusions. No pneumothorax. Soft tissue planes and included osseous structures are nonsuspicious.  IMPRESSION: Stable cardiomegaly with central pulmonary vasculature congestion ; increasing bibasilar airspace opacities may reflect confluent edema with small pleural effusions.   Electronically Signed   By: Elon Alas   On: 02/12/2014 03:57   Dg Chest Port 1 View  02/10/2014   CLINICAL DATA:  Short of breath  EXAM: PORTABLE CHEST - 1 VIEW  COMPARISON:  Chest radiograph 02/05/2014  FINDINGS: Stable enlarged cardiac silhouette. Mild pulmonary edema pattern. There are small bilateral pleural effusions. No pneumothorax.  IMPRESSION: No interval change.  Cardiomegaly and mild pulmonary edema.   Electronically Signed   By: Suzy Bouchard M.D.   On: 02/10/2014 08:43    EKG: Independently reviewed. Normal sinus rhythm, nonspecific T wave abnormalities   Assessment/Plan Active Problems:   Closed left subtrochanteric femur fracture   HTN (hypertension)   Diabetes mellitus without complication   Respiratory failure   Altered mental status  Altered mental status Improving, as patient now able to communicate better and not as confused as before. Likely combination of acute respiratory failure and effect of the medications. Continue BiPAP.  Acute respiratory failure Chest x-ray shows cardiomegaly with mild pulmonary edema, will obtain BNP, start Lasix 20 g IV every 12 hours Foley catheter to gravity and monitor the intake and output.  Diabetes mellitus Will start sliding scale insulin.  Recent surgery for intertrochanteric hip fracture Continue when necessary pain meds. Hold physical therapy till the patient is more alert and off BiPAP.  Hypertension Will continue the home medications but hold HCTZ at this time  as patient has been started on IV Lasix. Continue to monitor the blood pressure in the hospital.  DVT prophylaxis Lovenox   Code status:Patient is full code  Family discussion: Admission, patients condition and plan of care including tests being ordered have been discussed with the patient and her husband and son at bedside  who indicate understanding and agree with the plan and Code Status.   Time Spent on Admission: 45 minutes  [facility] Pager: [phone] 02/12/2014, 5:24 AM  If 7PM-7AM, please contact night-coverage  [email or web address]  Password [email or web address]

## 2014-02-12 NOTE — Progress Notes (Signed)
PT off BiPAP appears well , will monitor no plans to place back on BiPAP for now.

## 2014-02-13 ENCOUNTER — Inpatient Hospital Stay (HOSPITAL_COMMUNITY): Payer: Medicare HMO

## 2014-02-13 ENCOUNTER — Inpatient Hospital Stay
Admission: RE | Admit: 2014-02-13 | Discharge: 2014-05-08 | Disposition: A | Discharge status: Home / Self Care | Payer: Medicare HMO | Source: Ambulatory Visit | Attending: Internal Medicine | Admitting: Internal Medicine

## 2014-02-13 DIAGNOSIS — R609 Edema, unspecified: Principal | ICD-10-CM

## 2014-02-13 DIAGNOSIS — M869 Osteomyelitis, unspecified: Secondary | ICD-10-CM

## 2014-02-13 DIAGNOSIS — I82403 Acute embolism and thrombosis of unspecified deep veins of lower extremity, bilateral: Secondary | ICD-10-CM

## 2014-02-13 LAB — COMPREHENSIVE METABOLIC PANEL
ALT: 18 U/L (ref 0–35)
AST: 16 U/L (ref 0–37)
Albumin: 2.2 g/dL — ABNORMAL LOW (ref 3.5–5.2)
Alkaline Phosphatase: 74 U/L (ref 39–117)
Anion gap: 8 (ref 5–15)
BUN: 27 mg/dL — ABNORMAL HIGH (ref 6–23)
CALCIUM: 9.4 mg/dL (ref 8.4–10.5)
CO2: 36 mEq/L — ABNORMAL HIGH (ref 19–32)
Chloride: 93 mEq/L — ABNORMAL LOW (ref 96–112)
Creatinine, Ser: 0.9 mg/dL (ref 0.50–1.10)
GFR calc Af Amer: 73 mL/min — ABNORMAL LOW (ref >90)
GFR, EST NON AFRICAN AMERICAN: 63 mL/min — AB (ref >90)
Glucose, Bld: 152 mg/dL — ABNORMAL HIGH (ref 70–99)
Potassium: 3.9 mEq/L (ref 3.7–5.3)
SODIUM: 137 meq/L (ref 137–147)
Total Bilirubin: 0.5 mg/dL (ref 0.3–1.2)
Total Protein: 6.6 g/dL (ref 6.0–8.3)

## 2014-02-13 LAB — CBC
HCT: 35.4 % — ABNORMAL LOW (ref 36.0–46.0)
Hemoglobin: 11.4 g/dL — ABNORMAL LOW (ref 12.0–15.0)
MCH: 26.4 pg (ref 26.0–34.0)
MCHC: 32.2 g/dL (ref 30.0–36.0)
MCV: 81.9 fL (ref 78.0–100.0)
PLATELETS: 209 10*3/uL (ref 150–400)
RBC: 4.32 MIL/uL (ref 3.87–5.11)
RDW: 16 % — ABNORMAL HIGH (ref 11.5–15.5)
WBC: 10.2 10*3/uL (ref 4.0–10.5)

## 2014-02-13 LAB — GLUCOSE, CAPILLARY
GLUCOSE-CAPILLARY: 129 mg/dL — AB (ref 70–99)
GLUCOSE-CAPILLARY: 180 mg/dL — AB (ref 70–99)

## 2014-02-13 MED ORDER — ACETAMINOPHEN 325 MG PO TABS: 650.0000 mg | ORAL_TABLET | Freq: Four times a day (QID) | ORAL | Status: DC | PRN

## 2014-02-13 MED ORDER — ENOXAPARIN SODIUM 40 MG/0.4ML ~~LOC~~ SOLN: 40.0000 mg | SUBCUTANEOUS | Status: DC

## 2014-02-13 MED ORDER — FUROSEMIDE 20 MG PO TABS: 20.0000 mg | ORAL_TABLET | Freq: Every day | ORAL | Status: DC

## 2014-02-13 MED ORDER — LOSARTAN POTASSIUM 100 MG PO TABS: 100.0000 mg | ORAL_TABLET | Freq: Every day | ORAL | Status: DC

## 2014-02-13 MED ORDER — ACETAMINOPHEN 325 MG PO TABS: 650.0000 mg | ORAL_TABLET | Freq: Four times a day (QID) | ORAL | Status: DC

## 2014-02-13 NOTE — Discharge Summary (Signed)
Physician Discharge Summary  [name] OXB:[id number] DOB: 08-22-1940 DOA: 02/11/2014  PCP: [name] Morrow, MD Cardiologist: Dr. Doylene White  Admit date: 02/11/2014 Discharge date: 02/13/2014  Recommendations for Outpatient Follow-up:  1. Continued short-term rehabilitation for recent hip fracture, nondisplaced ankle fracture 2. Activity, orthopedic followup and DVT prophylaxis as previously recommended, see Dr. Keturah White discharge summary 02/10/2014. Weightbearing status is partial weight bearing on the left, no weight bearing on the right currently.  3. Avoid narcotics and benzodiazepines 4. Monitor daily weights, response to Lasix newly started with history of acute on chronic diastolic congestive heart failure. Hydrochlorothiazide discontinued. Amlodipine discontinued as it may be contributing to lower extremity edema. 5. Consider BMP in 3-5 days now that patient is on Lasix. 6. Follow chronic lower extremity edema, likely somewhat worse secondary to recent orthopedic procedures and noncompliance with diet.   Follow-up Information   Follow up with [facility], [place] In 2 weeks.   Specialty:  Family Medicine   Contact information:   [place] [address] [phone]      Discharge Diagnoses:  1. Acute respiratory failure with respiratory acidosis, hypercapnia 2. Acute encephalopathy 3. Suspected acute diastolic congestive heart failure 4. Leukocytosis 5. Diabetes mellitus type 2 6. History of mechanical fall with left hip fracture surgically repaired 7/3.  7. History of right nondisplaced ankle fracture 8. History of dislocation of great toe right that was surgically repaired.  Discharge Condition: Improved Disposition: Return to skilled nursing facility  Diet recommendation: Carb modified, low-salt  Filed Weights   02/12/14 0648 02/13/14 0500  Weight: 104.2 kg (229 lb 11.5 oz) 106.4 kg (234 lb 9.1 oz)    History of present illness:  73 year old woman  recently underwent hip fracture reduction and internal fixation as well as right first toe fracture/dislocation repair, discharge to skilled nursing facility 7/9, who was reported to have a delirium type symptoms at rehabilitation thought secondary to anesthesia and pain medications. At that place 7/10 she was thought to be more confused and having difficulty breathing. In the emergency department ABG revealed hypercapnia and she was referred for admission. Home medications included oxycodone. She was admitted for acute respiratory failure, possible CHF.  Hospital Course:  [name] was admitted to step down unit on BiPAP, condition rapidly improved with resolution of acute respiratory failure. Suspect secondary to oversedation from narcotics with component of acute on chronic diastolic heart failure. She has done well without any further BiPAP in mentation appears to be normal. She responded well to diuretics and will be placed on Lasix on discharge, hydrochlorothiazide will be discontinued. On discussion with husband and daughter at bedside today, patient at baseline mental status appears well. They agree with discharge today to skilled nursing facility if possible.   1. Acute respiratory failure with respiratory acidosis, hypercapnia. Clinically resolved. Suspect secondary to narcotics, sedation, possible acute heart failure. Avoid narcotics and benzodiazepines. 2. Acute encephalopathy. Resolved. Suspect secondary to pain medication. Avoid narcotics and avoid benzodiazepines. 3. Suspected acute diastolic congestive heart failure. Troponins negative. Has some left lower extremity edema, probably related to orthopedic issues. Weight is not accurate. 4. Leukocytosis. Resolved. Likely related to orthopedic issues. No evidence of infection.  5. DM type 2. Capillary blood sugars elevated but stable. 6. History of mechanical fall with left hip fracture surgically repaired 7/3. Continue Lovenox for DVT  prophylaxis. 7. History of right nondisplaced ankle fracture, per orthopedics nonweightbearing right leg, continue splint.  8. History of dislocation of great toe right that was surgically repaired.  9. Aortic stenosis.  Consultants:  Pulmonology Procedures: none  Antibiotics: none  Discharge Instructions     Medication List    STOP taking these medications       amLODipine 10 MG tablet  Commonly known as:  NORVASC     losartan-hydrochlorothiazide 100-25 MG per tablet  Commonly known as:  HYZAAR     oxyCODONE 5 MG immediate release tablet  Commonly known as:  Oxy IR/ROXICODONE      TAKE these medications       acetaminophen 325 MG tablet  Commonly known as:  TYLENOL  Take 2 tablets (650 mg total) by mouth every 6 (six) hours as needed for mild pain.     amitriptyline 10 MG tablet  Commonly known as:  ELAVIL  Take 10 mg by mouth at bedtime.     aspirin EC 81 MG tablet  Take 162 mg by mouth daily.     atorvastatin 20 MG tablet  Commonly known as:  LIPITOR  Take 20 mg by mouth at bedtime.     carvedilol 3.125 MG tablet  Commonly known as:  COREG  Take 1 tablet (3.125 mg total) by mouth 2 (two) times daily with a meal.     cetirizine 10 MG tablet  Commonly known as:  ZYRTEC  Take 10 mg by mouth daily as needed for allergies.     docusate sodium 100 MG capsule  Commonly known as:  COLACE  Take 100 mg by mouth 2 (two) times daily.     enoxaparin 40 MG/0.4ML injection  Commonly known as:  LOVENOX  Inject 0.4 mLs (40 mg total) into the skin daily. For DVT prophylaxis s/p hip surgery, recommend 30 days post-op then reassess need for any further treatment.     fluticasone 50 MCG/ACT nasal spray  Commonly known as:  FLONASE  Place 1 spray into both nostrils daily.     furosemide 20 MG tablet  Commonly known as:  LASIX  Take 1 tablet (20 mg total) by mouth daily.     gabapentin 300 MG capsule  Commonly known as:  NEURONTIN  Take 300 mg by mouth 2 (two) times  daily.     hydrALAZINE 25 MG tablet  Commonly known as:  APRESOLINE  Take 1 tablet (25 mg total) by mouth 4 (four) times daily.     hydrocortisone 2.5 % rectal cream  Commonly known as:  ANUSOL-HC  Apply 1 application topically 4 (four) times daily.     insulin aspart 100 UNIT/ML injection  Commonly known as:  novoLOG  Inject 0-15 Units into the skin 3 (three) times daily with meals. Before each meal 3 times a day, 140-199 - 3 units, 200-250 - 5 units, 251-299 - 8 units,  300-349 - 12 units,  350 or above 15 units.     insulin detemir 100 UNIT/ML injection  Commonly known as:  LEVEMIR  Inject 0.4 mLs (40 Units total) into the skin 2 (two) times daily.     losartan 100 MG tablet  Commonly known as:  COZAAR  Take 1 tablet (100 mg total) by mouth daily.     meloxicam 15 MG tablet  Commonly known as:  MOBIC  Take 15 mg by mouth daily as needed for pain.     omeprazole 20 MG capsule  Commonly known as:  PRILOSEC  Take 20 mg by mouth daily.     polyethylene glycol packet  Commonly known as:  MIRALAX / GLYCOLAX  Take 17 g by  mouth 2 (two) times daily.       Allergies  Allergen Reactions  . Iodine Anaphylaxis  . Penicillins Anaphylaxis  . Shellfish Allergy Anaphylaxis  . Sulfa Antibiotics Anaphylaxis   The results of significant diagnostics from this hospitalization (including imaging, microbiology, ancillary and laboratory) are listed below for reference.    Significant Diagnostic Studies: Dg Chest 1 View  02/11/2014   CLINICAL DATA:  Chest pain.  EXAM: CHEST - 1 VIEW  COMPARISON:  Single view of the chest 02/10/2014 and 02/05/2014.  FINDINGS: There is cardiomegaly and mild vascular congestion. There is a small right pleural effusion and bibasilar subsegmental atelectasis.  IMPRESSION: New small right pleural effusion.  Cardiomegaly and vascular congestion.   Electronically Signed   By: Inge Rise M.D.   On: 02/11/2014 21:45   Dg Abd 1 View  02/11/2014   CLINICAL  DATA:  Left lower quadrant abdominal pain.  EXAM: ABDOMEN - 1 VIEW  COMPARISON:  None.  FINDINGS: The bowel gas pattern is normal. No radio-opaque calculi or other significant radiographic abnormality are seen.  IMPRESSION: Negative.   Electronically Signed   By: Inge Rise M.D.   On: 02/11/2014 21:48   Ct Head Wo Contrast  02/12/2014   CLINICAL DATA:  Altered mental status. Recent hospital discharge for hip fracture.  EXAM: CT HEAD WITHOUT CONTRAST  TECHNIQUE: Contiguous axial images were obtained from the base of the skull through the vertex without intravenous contrast.  COMPARISON:  CT of the head February 03, 2014  FINDINGS: The ventricles and sulci are normal for age. No intraparenchymal hemorrhage, mass effect nor midline shift. Patchy supratentorial white matter hypodensities are within normal range for patient's age and though non-specific suggest sequelae of chronic small vessel ischemic disease. No acute large vascular territory infarcts.  No abnormal extra-axial fluid collections. Basal cisterns are patent. Moderate calcific atherosclerosis of the carotid siphons.  No skull fracture. Osteopenia. The included ocular globes and orbital contents are non-suspicious. Status post bilateral ocular lens implants The mastoid aircells and included paranasal sinuses are well-aerated.  IMPRESSION: No acute intracranial process.  Stable appearance of the head from February 03, 2014: Involutional changes. Mild white matter changes suggest chronic small vessel ischemic disease.   Electronically Signed   By: Elon Alas   On: 02/12/2014 00:47   Dg Chest Portable 1 View  02/12/2014   CLINICAL DATA:  Dyspnea.  EXAM: PORTABLE CHEST - 1 VIEW  COMPARISON:  Chest radiograph February 11, 2014  FINDINGS: The cardiac silhouette appears similarly enlarged with central pulmonary vasculature congestion. Slightly increasing bibasilar airspace opacities with small pleural effusions. No pneumothorax. Soft tissue planes and  included osseous structures are nonsuspicious.  IMPRESSION: Stable cardiomegaly with central pulmonary vasculature congestion ; increasing bibasilar airspace opacities may reflect confluent edema with small pleural effusions.   Electronically Signed   By: Elon Alas   On: 02/12/2014 03:57    Microbiology: Recent Results (from the past 240 hour(s))  MRSA PCR SCREENING     Status: None   Collection Time    02/03/14  7:03 PM      Result Value Ref Range Status   MRSA by PCR NEGATIVE  NEGATIVE Final   Comment:            The GeneXpert MRSA Assay (FDA     approved for NASAL specimens     only), is one component of a     comprehensive MRSA colonization     surveillance program. It  is not     intended to diagnose MRSA     infection nor to guide or     monitor treatment for     MRSA infections.  URINE CULTURE     Status: None   Collection Time    02/07/14 11:33 PM      Result Value Ref Range Status   Specimen Description URINE, CLEAN CATCH   Final   Special Requests NONE   Final   Culture  Setup Time     Final   Value: 02/08/2014 04:02     Performed at [facility] Count     Final   Value: NO GROWTH     Performed at [facility]   Culture     Final   Value: NO GROWTH     Performed at [facility]   Report Status 02/09/2014 FINAL   Final  MRSA PCR SCREENING     Status: None   Collection Time    02/12/14  6:41 AM      Result Value Ref Range Status   MRSA by PCR NEGATIVE  NEGATIVE Final   Comment:            The GeneXpert MRSA Assay (FDA     approved for NASAL specimens     only), is one component of a     comprehensive MRSA colonization     surveillance program. It is not     intended to diagnose MRSA     infection nor to guide or     monitor treatment for     MRSA infections.     Labs: Basic Metabolic Panel:  Recent Labs Lab 02/08/14 0548 02/09/14 1315 02/10/14 0522 02/12/14 0102 02/13/14 0711  NA 141 136* 135* 134* 137  K  4.2 4.5 3.8 4.1 3.9  CL 97 94* 92* 92* 93*  CO2 36* 32 32 35* 36*  GLUCOSE 154* 234* 119* 238* 152*  BUN 9 10 9 20  27*  CREATININE 0.56 0.70 0.63 0.95 0.90  CALCIUM 8.9 8.9 9.4 9.3 9.4   Liver Function Tests:  Recent Labs Lab 02/12/14 0102 02/13/14 0711  AST 14 16  ALT 13 18  ALKPHOS 70 74  BILITOT 0.6 0.5  PROT 6.2 6.6  ALBUMIN 2.2* 2.2*   CBC:  Recent Labs Lab 02/08/14 0548 02/09/14 1315 02/10/14 0522 02/12/14 0102 02/13/14 0711  WBC 10.0 14.1* 14.5* 14.3* 10.2  NEUTROABS  --   --   --  11.8*  --   HGB 11.5* 11.4* 11.6* 11.2* 11.4*  HCT 37.7 35.9* 37.0 34.9* 35.4*  MCV 85.3 83.3 82.8 82.3 81.9  PLT 174 202 207 207 209   Cardiac Enzymes:  Recent Labs Lab 02/12/14 0102 02/12/14 0733 02/12/14 1447 02/12/14 2130  TROPONINI <0.30 <0.30 <0.30 <0.30     Recent Labs  02/12/14 0058  PROBNP 348.0*   CBG:  Recent Labs Lab 02/12/14 1144 02/12/14 1646 02/12/14 2123 02/13/14 0749 02/13/14 1116  GLUCAP 312* 248* 276* 129* 180*    Principal Problem:   Acute respiratory failure with hypercapnia Active Problems:   Closed left subtrochanteric femur fracture   HTN (hypertension)   Diabetes mellitus without complication   Acute encephalopathy   Acute diastolic CHF (congestive heart failure)   Time coordinating discharge: 35 minutes  Signed:  Murray Hodgkins, MD [facility] 02/13/2014, 1:47 PM

## 2014-02-13 NOTE — Progress Notes (Signed)
Pt a/o.vss. Saline lock removed. No complaints of any distress. Report called to cathy at the [facility]. Pt transferred via bed to facility.

## 2014-02-13 NOTE — Progress Notes (Addendum)
PROGRESS NOTE  [name]:[id number] DOB: 31-Jul-1941 DOA: 02/11/2014 PCP: Tomasita Morrow, MD Radiologist: Dr. Doylene Canard   Summary: 73 year old woman recently underwent hip fracture reduction and internal fixation as well as right first toe fracture/dislocation repair, discharge to skilled nursing facility 7/9, who was reported to have a delirium type symptoms at rehabilitation thought secondary to anesthesia and pain medications. At that place 7/10 she was thought to be more confused and having difficulty breathing. In the emergency department ABG revealed hypercapnia and she was referred for admission. Home medications included oxycodone. She was admitted for acute respiratory failure, possible CHF  Assessment/Plan: 1. Acute respiratory failure with respiratory acidosis, hypercapnia. Clinically resolved. Suspect secondary to narcotics, sedation, possible acute heart failure. 2. Acute encephalopathy. Resolved. Suspect secondary to pain medication. Minimize narcotics and avoid benzodiazepines. 3. Possible acute diastolic congestive heart failure. Troponins negative. Clinically euvolemic. Has some left lower extremity edema, probably related to orthopedic issues. Weight is not accurate. 4. Leukocytosis. Resolved. Likely related to orthopedic issues. No evidence of infection.  5. DM type 2. Capillary blood sugars elevated but stable. 6. History of mechanical fall with left hip fracture surgically repaired 7/3. Continue Lovenox for DVT prophylaxis. 7. History of right nondisplaced ankle fracture, per orthopedics nonweightbearing right leg, continue splint.  8. History of dislocation of great toe right that was surgically repaired. 9. Aortic stenosis.   Overall much improved. Has not required BiPAP. Daughter has noted the patient has done well without the medication and getting up to the commode last night.   Wean oxygen  Continue to avoid narcotics and benzodiazepines.  Anticipate discharge  next 24 hours.  Discussed in detail with daughter at bedside discharge plans in general treatment. All questions answered. Updated RN.  Code Status: full code DVT prophylaxis: Lovenox Family Communication:  Disposition Plan: return to [place], [place]  [facility]  Pager [phone] If 7PM-7AM, please contact night-coverage at [email or web address], password [facility] 02/13/2014, 7:43 AM  LOS: 2 days   Consultants:  Pulmonology  Procedures:    Antibiotics:    HPI/Subjective: No issues charted overnight.  Poor sleep overnight but no new issues. Doing well without BiPAP.  Objective: Filed Vitals:   02/13/14 0300 02/13/14 0400 02/13/14 0500 02/13/14 0700  BP: 111/40 114/36 125/41 141/48  Pulse: 76 76 74 78  Temp:   98.1 F (36.7 C)   TempSrc:   Oral   Resp: 15 16 17 16   Height:      Weight:   106.4 kg (234 lb 9.1 oz)   SpO2: 100% 100% 97% 94%   No intake or output data in the 24 hours ending 02/13/14 0743   Filed Weights   02/12/14 0648 02/13/14 0500  Weight: 104.2 kg (229 lb 11.5 oz) 106.4 kg (234 lb 9.1 oz)    Exam:     Afebrile, vital signs stable. Gen. Appears calm and comfortable. Psych. Alert. Speech fluent and clear. Grossly normal mood and affect. Eyes. Pupils round, appear grossly normal. Cardiovascular. No murmur, rub or gallop. Regular rate and rhythm. 1+ left lower extremity edema. Cannot assess right lower extremity secondary to splint. Respiratory. Clear to auscultation bilaterally. No wheezes, rales or rhonchi. Normal respiratory effort. Abdomen. Soft Musculoskeletal. Grossly nonfocal Neurologic. Grossly nonfocal  Data Reviewed: I/O: multiple voids, BM x1, weight unreliable Chemistry: Capillary blood sugars stable. Basic metabolic panel unremarkable. Troponins negative. Heme: Leukocytosis resolved. Hemoglobin stable 11.4.   Scheduled Meds: . acetaminophen  650 mg Oral Q6H  . amLODipine  10 mg Oral Daily  . aspirin EC  162 mg  Oral Daily  . atorvastatin  20 mg Oral QHS  . carvedilol  3.125 mg Oral BID WC  . docusate sodium  100 mg Oral BID  . enoxaparin (LOVENOX) injection  40 mg Subcutaneous Q24H  . fluticasone  1 spray Each Nare Daily  . furosemide  40 mg Oral Daily  . gabapentin  300 mg Oral BID  . hydrALAZINE  25 mg Oral QID  . hydrocortisone  1 application Topical QID  . insulin aspart  0-15 Units Subcutaneous TID WC  . insulin aspart  0-5 Units Subcutaneous QHS  . insulin detemir  40 Units Subcutaneous BID  . loratadine  10 mg Oral Daily  . losartan  100 mg Oral Daily  . meloxicam  15 mg Oral Daily  . pantoprazole  40 mg Oral Daily  . polyethylene glycol  17 g Oral BID  . sodium chloride  3 mL Intravenous Q12H   Continuous Infusions:   Principal Problem:   Acute respiratory failure with hypercapnia Active Problems:   Closed left subtrochanteric femur fracture   HTN (hypertension)   Diabetes mellitus without complication   Acute encephalopathy   Acute diastolic CHF (congestive heart failure)

## 2014-02-13 NOTE — Progress Notes (Signed)
Subjective: She has done well off BiPAP all day yesterday and through the night. She has no new complaints. She is requesting a incentive spirometry because she says that seems to have helped her in the past. She's coughing a little bit but not able to cough anything up.  Objective: Vital signs in last 24 hours: Temp:  [97.7 F (36.5 C)-98.4 F (36.9 C)] 97.7 F (36.5 C) (07/12 0800) Pulse Rate:  [70-91] 70 (07/12 0800) Resp:  [14-22] 14 (07/12 0800) BP: (99-144)/(36-84) 99/37 mmHg (07/12 0800) SpO2:  [93 %-100 %] 99 % (07/12 0800) Weight:  [106.4 kg (234 lb 9.1 oz)] 106.4 kg (234 lb 9.1 oz) (07/12 0500) Weight change: 2.2 kg (4 lb 13.6 oz) Last BM Date: 02/12/14  Intake/Output from previous day:    PHYSICAL EXAM General appearance: alert, cooperative and no distress Resp: clear to auscultation bilaterally Cardio: regular rate and rhythm, S1, S2 normal, no murmur, click, rub or gallop GI: soft, non-tender; bowel sounds normal; no masses,  no organomegaly Extremities: She is postsurgical right leg and has for right lower leg in Ace wraps  Lab Results:  Results for orders placed during the hospital encounter of 02/11/14 (from the past 48 hour(s))  BLOOD GAS, ARTERIAL     Status: Abnormal   Collection Time    02/12/14 12:18 AM      Result Value Ref Range   FIO2 0.28     Delivery systems NASAL CANNULA     pH, Arterial 7.359  7.350 - 7.450   pCO2 arterial 64.2 (*) 35.0 - 45.0 mmHg   Comment: CRITICAL RESULT CALLED TO, READ BACK BY AND VERIFIED WITH:     Kayleen Memos, RN BY B. STOPHEL, RRT,RCP ON 02/12/14 AT 00:38.   pO2, Arterial 80.3  80.0 - 100.0 mmHg   Bicarbonate 35.3 (*) 20.0 - 24.0 mEq/L   TCO2 32.7  0 - 100 mmol/L   Acid-Base Excess 9.7 (*) 0.0 - 2.0 mmol/L   O2 Saturation 95.1     Patient temperature 37.0     Collection site BRACHIAL ARTERY     Drawn by [phone]     Sample type ARTERIAL DRAW     Allens test (pass/fail) PASS  PASS  PRO B NATRIURETIC PEPTIDE     Status:  Abnormal   Collection Time    02/12/14 12:58 AM      Result Value Ref Range   Pro B Natriuretic peptide (BNP) 348.0 (*) 0 - 125 pg/mL  ETHANOL     Status: None   Collection Time    02/12/14  1:02 AM      Result Value Ref Range   Alcohol, Ethyl (B) <11  0 - 11 mg/dL   Comment:            LOWEST DETECTABLE LIMIT FOR     SERUM ALCOHOL IS 11 mg/dL     FOR MEDICAL PURPOSES ONLY  PROTIME-INR     Status: None   Collection Time    02/12/14  1:02 AM      Result Value Ref Range   Prothrombin Time 14.3  11.6 - 15.2 seconds   INR 1.11  0.00 - 1.49  APTT     Status: Abnormal   Collection Time    02/12/14  1:02 AM      Result Value Ref Range   aPTT 40 (*) 24 - 37 seconds   Comment:            IF BASELINE aPTT  IS ELEVATED,     SUGGEST PATIENT RISK ASSESSMENT     BE USED TO DETERMINE APPROPRIATE     ANTICOAGULANT THERAPY.  CBC     Status: Abnormal   Collection Time    02/12/14  1:02 AM      Result Value Ref Range   WBC 14.3 (*) 4.0 - 10.5 K/uL   RBC 4.24  3.87 - 5.11 MIL/uL   Hemoglobin 11.2 (*) 12.0 - 15.0 g/dL   HCT 34.9 (*) 36.0 - 46.0 %   MCV 82.3  78.0 - 100.0 fL   MCH 26.4  26.0 - 34.0 pg   MCHC 32.1  30.0 - 36.0 g/dL   RDW 16.0 (*) 11.5 - 15.5 %   Platelets 207  150 - 400 K/uL  DIFFERENTIAL     Status: Abnormal   Collection Time    02/12/14  1:02 AM      Result Value Ref Range   Neutrophils Relative % 82 (*) 43 - 77 %   Neutro Abs 11.8 (*) 1.7 - 7.7 K/uL   Lymphocytes Relative 7 (*) 12 - 46 %   Lymphs Abs 1.0  0.7 - 4.0 K/uL   Monocytes Relative 9  3 - 12 %   Monocytes Absolute 1.3 (*) 0.1 - 1.0 K/uL   Eosinophils Relative 2  0 - 5 %   Eosinophils Absolute 0.2  0.0 - 0.7 K/uL   Basophils Relative 0  0 - 1 %   Basophils Absolute 0.0  0.0 - 0.1 K/uL  COMPREHENSIVE METABOLIC PANEL     Status: Abnormal   Collection Time    02/12/14  1:02 AM      Result Value Ref Range   Sodium 134 (*) 137 - 147 mEq/L   Potassium 4.1  3.7 - 5.3 mEq/L   Chloride 92 (*) 96 - 112 mEq/L    CO2 35 (*) 19 - 32 mEq/L   Glucose, Bld 238 (*) 70 - 99 mg/dL   BUN 20  6 - 23 mg/dL   Creatinine, Ser 0.95  0.50 - 1.10 mg/dL   Calcium 9.3  8.4 - 10.5 mg/dL   Total Protein 6.2  6.0 - 8.3 g/dL   Albumin 2.2 (*) 3.5 - 5.2 g/dL   AST 14  0 - 37 U/L   ALT 13  0 - 35 U/L   Alkaline Phosphatase 70  39 - 117 U/L   Total Bilirubin 0.6  0.3 - 1.2 mg/dL   GFR calc non Af Amer 59 (*) >90 mL/min   GFR calc Af Amer 68 (*) >90 mL/min   Comment: (NOTE)     The eGFR has been calculated using the CKD EPI equation.     This calculation has not been validated in all clinical situations.     eGFR's persistently <90 mL/min signify possible Chronic Kidney     Disease.   Anion gap 7  5 - 15  TROPONIN I     Status: None   Collection Time    02/12/14  1:02 AM      Result Value Ref Range   Troponin I <0.30  <0.30 ng/mL   Comment:            Due to the release kinetics of cTnI,     a negative result within the first hours     of the onset of symptoms does not rule out     myocardial infarction with certainty.     If myocardial  infarction is still suspected,     repeat the test at appropriate intervals.  URINE RAPID DRUG SCREEN (HOSP PERFORMED)     Status: None   Collection Time    02/12/14  1:10 AM      Result Value Ref Range   Opiates NONE DETECTED  NONE DETECTED   Cocaine NONE DETECTED  NONE DETECTED   Benzodiazepines NONE DETECTED  NONE DETECTED   Amphetamines NONE DETECTED  NONE DETECTED   Tetrahydrocannabinol NONE DETECTED  NONE DETECTED   Barbiturates NONE DETECTED  NONE DETECTED   Comment:            DRUG SCREEN FOR MEDICAL PURPOSES     ONLY.  IF CONFIRMATION IS NEEDED     FOR ANY PURPOSE, NOTIFY LAB     WITHIN 5 DAYS.                LOWEST DETECTABLE LIMITS     FOR URINE DRUG SCREEN     Drug Class       Cutoff (ng/mL)     Amphetamine      1000     Barbiturate      200     Benzodiazepine   742     Tricyclics       595     Opiates          300     Cocaine          300     THC               50  URINALYSIS, ROUTINE W REFLEX MICROSCOPIC     Status: None   Collection Time    02/12/14  1:10 AM      Result Value Ref Range   Color, Urine YELLOW  YELLOW   APPearance CLEAR  CLEAR   Specific Gravity, Urine 1.020  1.005 - 1.030   pH 5.0  5.0 - 8.0   Glucose, UA NEGATIVE  NEGATIVE mg/dL   Hgb urine dipstick NEGATIVE  NEGATIVE   Bilirubin Urine NEGATIVE  NEGATIVE   Ketones, ur NEGATIVE  NEGATIVE mg/dL   Protein, ur NEGATIVE  NEGATIVE mg/dL   Urobilinogen, UA 0.2  0.0 - 1.0 mg/dL   Nitrite NEGATIVE  NEGATIVE   Leukocytes, UA NEGATIVE  NEGATIVE   Comment: MICROSCOPIC NOT DONE ON URINES WITH NEGATIVE PROTEIN, BLOOD, LEUKOCYTES, NITRITE, OR GLUCOSE <1000 mg/dL.  BLOOD GAS, ARTERIAL     Status: Abnormal   Collection Time    02/12/14  3:01 AM      Result Value Ref Range   FIO2 0.28     Delivery systems NASAL CANNULA     pH, Arterial 7.318 (*) 7.350 - 7.450   pCO2 arterial 71.1 (*) 35.0 - 45.0 mmHg   Comment: CRITICAL RESULT CALLED TO, READ BACK BY AND VERIFIED WITH:     Kayleen Memos, RN BY B. STOPHEL RRT,RCP ON 02/12/14 AT 3:25.   pO2, Arterial 85.8  80.0 - 100.0 mmHg   Bicarbonate 35.4 (*) 20.0 - 24.0 mEq/L   TCO2 32.9  0 - 100 mmol/L   Acid-Base Excess 9.2 (*) 0.0 - 2.0 mmol/L   O2 Saturation 95.6     Patient temperature 37.0     Collection site RIGHT RADIAL     Drawn by [phone]     Sample type ARTERIAL DRAW     Allens test (pass/fail) PASS  PASS  MRSA PCR SCREENING     Status: None  Collection Time    02/12/14  6:41 AM      Result Value Ref Range   MRSA by PCR NEGATIVE  NEGATIVE   Comment:            The GeneXpert MRSA Assay (FDA     approved for NASAL specimens     only), is one component of a     comprehensive MRSA colonization     surveillance program. It is not     intended to diagnose MRSA     infection nor to guide or     monitor treatment for     MRSA infections.  GLUCOSE, CAPILLARY     Status: Abnormal   Collection Time    02/12/14  7:20 AM       Result Value Ref Range   Glucose-Capillary 223 (*) 70 - 99 mg/dL  TROPONIN I     Status: None   Collection Time    02/12/14  7:33 AM      Result Value Ref Range   Troponin I <0.30  <0.30 ng/mL   Comment:            Due to the release kinetics of cTnI,     a negative result within the first hours     of the onset of symptoms does not rule out     myocardial infarction with certainty.     If myocardial infarction is still suspected,     repeat the test at appropriate intervals.  GLUCOSE, CAPILLARY     Status: Abnormal   Collection Time    02/12/14 11:44 AM      Result Value Ref Range   Glucose-Capillary 312 (*) 70 - 99 mg/dL  TROPONIN I     Status: None   Collection Time    02/12/14  2:47 PM      Result Value Ref Range   Troponin I <0.30  <0.30 ng/mL   Comment:            Due to the release kinetics of cTnI,     a negative result within the first hours     of the onset of symptoms does not rule out     myocardial infarction with certainty.     If myocardial infarction is still suspected,     repeat the test at appropriate intervals.  GLUCOSE, CAPILLARY     Status: Abnormal   Collection Time    02/12/14  4:46 PM      Result Value Ref Range   Glucose-Capillary 248 (*) 70 - 99 mg/dL  GLUCOSE, CAPILLARY     Status: Abnormal   Collection Time    02/12/14  9:23 PM      Result Value Ref Range   Glucose-Capillary 276 (*) 70 - 99 mg/dL   Comment 1 Notify RN    TROPONIN I     Status: None   Collection Time    02/12/14  9:30 PM      Result Value Ref Range   Troponin I <0.30  <0.30 ng/mL   Comment:            Due to the release kinetics of cTnI,     a negative result within the first hours     of the onset of symptoms does not rule out     myocardial infarction with certainty.     If myocardial infarction is still suspected,     repeat the test at appropriate intervals.  CBC     Status: Abnormal   Collection Time    02/13/14  7:11 AM      Result Value Ref Range   WBC 10.2   4.0 - 10.5 K/uL   RBC 4.32  3.87 - 5.11 MIL/uL   Hemoglobin 11.4 (*) 12.0 - 15.0 g/dL   HCT 35.4 (*) 36.0 - 46.0 %   MCV 81.9  78.0 - 100.0 fL   MCH 26.4  26.0 - 34.0 pg   MCHC 32.2  30.0 - 36.0 g/dL   RDW 16.0 (*) 11.5 - 15.5 %   Platelets 209  150 - 400 K/uL  COMPREHENSIVE METABOLIC PANEL     Status: Abnormal   Collection Time    02/13/14  7:11 AM      Result Value Ref Range   Sodium 137  137 - 147 mEq/L   Potassium 3.9  3.7 - 5.3 mEq/L   Chloride 93 (*) 96 - 112 mEq/L   CO2 36 (*) 19 - 32 mEq/L   Glucose, Bld 152 (*) 70 - 99 mg/dL   BUN 27 (*) 6 - 23 mg/dL   Creatinine, Ser 0.90  0.50 - 1.10 mg/dL   Calcium 9.4  8.4 - 10.5 mg/dL   Total Protein 6.6  6.0 - 8.3 g/dL   Albumin 2.2 (*) 3.5 - 5.2 g/dL   AST 16  0 - 37 U/L   ALT 18  0 - 35 U/L   Alkaline Phosphatase 74  39 - 117 U/L   Total Bilirubin 0.5  0.3 - 1.2 mg/dL   GFR calc non Af Amer 63 (*) >90 mL/min   GFR calc Af Amer 73 (*) >90 mL/min   Comment: (NOTE)     The eGFR has been calculated using the CKD EPI equation.     This calculation has not been validated in all clinical situations.     eGFR's persistently <90 mL/min signify possible Chronic Kidney     Disease.   Anion gap 8  5 - 15  GLUCOSE, CAPILLARY     Status: Abnormal   Collection Time    02/13/14  7:49 AM      Result Value Ref Range   Glucose-Capillary 129 (*) 70 - 99 mg/dL    ABGS  Recent Labs  02/12/14 0301  PHART 7.318*  PO2ART 85.8  TCO2 32.9  HCO3 35.4*   CULTURES Recent Results (from the past 240 hour(s))  MRSA PCR SCREENING     Status: None   Collection Time    02/03/14  7:03 PM      Result Value Ref Range Status   MRSA by PCR NEGATIVE  NEGATIVE Final   Comment:            The GeneXpert MRSA Assay (FDA     approved for NASAL specimens     only), is one component of a     comprehensive MRSA colonization     surveillance program. It is not     intended to diagnose MRSA     infection nor to guide or     monitor treatment for      MRSA infections.  URINE CULTURE     Status: None   Collection Time    02/07/14 11:33 PM      Result Value Ref Range Status   Specimen Description URINE, CLEAN CATCH   Final   Special Requests NONE   Final   Culture  Setup Time  Final   Value: 02/08/2014 04:02     Performed at [facility] Count     Final   Value: NO GROWTH     Performed at [facility]   Culture     Final   Value: NO GROWTH     Performed at [facility]   Report Status 02/09/2014 FINAL   Final  MRSA PCR SCREENING     Status: None   Collection Time    02/12/14  6:41 AM      Result Value Ref Range Status   MRSA by PCR NEGATIVE  NEGATIVE Final   Comment:            The GeneXpert MRSA Assay (FDA     approved for NASAL specimens     only), is one component of a     comprehensive MRSA colonization     surveillance program. It is not     intended to diagnose MRSA     infection nor to guide or     monitor treatment for     MRSA infections.   Studies/Results: Dg Chest 1 View  02/11/2014   CLINICAL DATA:  Chest pain.  EXAM: CHEST - 1 VIEW  COMPARISON:  Single view of the chest 02/10/2014 and 02/05/2014.  FINDINGS: There is cardiomegaly and mild vascular congestion. There is a small right pleural effusion and bibasilar subsegmental atelectasis.  IMPRESSION: New small right pleural effusion.  Cardiomegaly and vascular congestion.   Electronically Signed   By: Inge Rise M.D.   On: 02/11/2014 21:45   Dg Abd 1 View  02/11/2014   CLINICAL DATA:  Left lower quadrant abdominal pain.  EXAM: ABDOMEN - 1 VIEW  COMPARISON:  None.  FINDINGS: The bowel gas pattern is normal. No radio-opaque calculi or other significant radiographic abnormality are seen.  IMPRESSION: Negative.   Electronically Signed   By: Inge Rise M.D.   On: 02/11/2014 21:48   Ct Head Wo Contrast  02/12/2014   CLINICAL DATA:  Altered mental status. Recent hospital discharge for hip fracture.  EXAM: CT HEAD WITHOUT  CONTRAST  TECHNIQUE: Contiguous axial images were obtained from the base of the skull through the vertex without intravenous contrast.  COMPARISON:  CT of the head February 03, 2014  FINDINGS: The ventricles and sulci are normal for age. No intraparenchymal hemorrhage, mass effect nor midline shift. Patchy supratentorial white matter hypodensities are within normal range for patient's age and though non-specific suggest sequelae of chronic small vessel ischemic disease. No acute large vascular territory infarcts.  No abnormal extra-axial fluid collections. Basal cisterns are patent. Moderate calcific atherosclerosis of the carotid siphons.  No skull fracture. Osteopenia. The included ocular globes and orbital contents are non-suspicious. Status post bilateral ocular lens implants The mastoid aircells and included paranasal sinuses are well-aerated.  IMPRESSION: No acute intracranial process.  Stable appearance of the head from February 03, 2014: Involutional changes. Mild white matter changes suggest chronic small vessel ischemic disease.   Electronically Signed   By: Elon Alas   On: 02/12/2014 00:47   Dg Chest Portable 1 View  02/12/2014   CLINICAL DATA:  Dyspnea.  EXAM: PORTABLE CHEST - 1 VIEW  COMPARISON:  Chest radiograph February 11, 2014  FINDINGS: The cardiac silhouette appears similarly enlarged with central pulmonary vasculature congestion. Slightly increasing bibasilar airspace opacities with small pleural effusions. No pneumothorax. Soft tissue planes and included osseous structures are nonsuspicious.  IMPRESSION: Stable cardiomegaly with  central pulmonary vasculature congestion ; increasing bibasilar airspace opacities may reflect confluent edema with small pleural effusions.   Electronically Signed   By: Elon Alas   On: 02/12/2014 03:57    Medications:  Prior to Admission:  Prescriptions prior to admission  Medication Sig Dispense Refill  . amitriptyline (ELAVIL) 10 MG tablet Take 10 mg by  mouth at bedtime.      [name] amLODipine (NORVASC) 10 MG tablet Take 1 tablet (10 mg total) by mouth daily.      [name] aspirin EC 81 MG tablet Take 162 mg by mouth daily.      [name] atorvastatin (LIPITOR) 20 MG tablet Take 20 mg by mouth at bedtime.      . carvedilol (COREG) 3.125 MG tablet Take 1 tablet (3.125 mg total) by mouth 2 (two) times daily with a meal.      . docusate sodium (COLACE) 100 MG capsule Take 100 mg by mouth 2 (two) times daily.      . fluticasone (FLONASE) 50 MCG/ACT nasal spray Place 1 spray into both nostrils daily.      [name] gabapentin (NEURONTIN) 300 MG capsule Take 300 mg by mouth 2 (two) times daily.      . hydrALAZINE (APRESOLINE) 25 MG tablet Take 1 tablet (25 mg total) by mouth 4 (four) times daily.      . hydrocortisone (ANUSOL-HC) 2.5 % rectal cream Apply 1 application topically 4 (four) times daily.      . insulin aspart (NOVOLOG) 100 UNIT/ML injection Inject 0-15 Units into the skin 3 (three) times daily with meals. Before each meal 3 times a day, 140-199 - 3 units, 200-250 - 5 units, 251-299 - 8 units,  300-349 - 12 units,  350 or above 15 units.  10 mL  11  . insulin detemir (LEVEMIR) 100 UNIT/ML injection Inject 0.4 mLs (40 Units total) into the skin 2 (two) times daily.  10 mL  11  . losartan-hydrochlorothiazide (HYZAAR) 100-25 MG per tablet Take 1 tablet by mouth daily.      [name] omeprazole (PRILOSEC) 20 MG capsule Take 20 mg by mouth daily.      [name] oxyCODONE (OXY IR/ROXICODONE) 5 MG immediate release tablet Take 1 tablet (5 mg total) by mouth every 4 (four) hours as needed for moderate pain or severe pain.  20 tablet  0  . polyethylene glycol (MIRALAX / GLYCOLAX) packet Take 17 g by mouth 2 (two) times daily.  14 each  0  . cetirizine (ZYRTEC) 10 MG tablet Take 10 mg by mouth daily as needed for allergies.      . meloxicam (MOBIC) 15 MG tablet Take 15 mg by mouth daily as needed for pain.       Scheduled: . acetaminophen  650 mg Oral Q6H  . amLODipine  10 mg Oral Daily  .  aspirin EC  162 mg Oral Daily  . atorvastatin  20 mg Oral QHS  . carvedilol  3.125 mg Oral BID WC  . docusate sodium  100 mg Oral BID  . enoxaparin (LOVENOX) injection  40 mg Subcutaneous Q24H  . fluticasone  1 spray Each Nare Daily  . furosemide  40 mg Oral Daily  . gabapentin  300 mg Oral BID  . hydrALAZINE  25 mg Oral QID  . hydrocortisone  1 application Topical QID  . insulin aspart  0-15 Units Subcutaneous TID WC  . insulin aspart  0-5 Units Subcutaneous QHS  . insulin detemir  40 Units Subcutaneous  BID  . loratadine  10 mg Oral Daily  . losartan  100 mg Oral Daily  . meloxicam  15 mg Oral Daily  . pantoprazole  40 mg Oral Daily  . polyethylene glycol  17 g Oral BID  . sodium chloride  3 mL Intravenous Q12H   Continuous:  [id number]:[id number] chloride, ondansetron (ZOFRAN) IV, ondansetron, sodium chloride  Assesment: She was admitted with acute respiratory failure which was hypercapnic. She was acutely encephalopathic and that's better. She has acute diastolic heart failure. She has markedly improved. She has not required BiPAP for approximately 24 hours Principal Problem:   Acute respiratory failure with hypercapnia Active Problems:   Closed left subtrochanteric femur fracture   HTN (hypertension)   Diabetes mellitus without complication   Acute encephalopathy   Acute diastolic CHF (congestive heart failure)    Plan: Continue current treatments. She is on nasal oxygen. She will have incentive spirometry and a flutter valve.    LOS: 2 days   Karena Kinker L 02/13/2014, 9:01 AM

## 2014-02-14 ENCOUNTER — Non-Acute Institutional Stay (SKILLED_NURSING_FACILITY): Payer: Medicare HMO | Admitting: Internal Medicine

## 2014-02-14 DIAGNOSIS — E119 Type 2 diabetes mellitus without complications: Secondary | ICD-10-CM | POA: Insufficient documentation

## 2014-02-14 DIAGNOSIS — I509 Heart failure, unspecified: Secondary | ICD-10-CM

## 2014-02-14 DIAGNOSIS — E1149 Type 2 diabetes mellitus with other diabetic neurological complication: Secondary | ICD-10-CM

## 2014-02-14 DIAGNOSIS — I110 Hypertensive heart disease with heart failure: Secondary | ICD-10-CM

## 2014-02-14 DIAGNOSIS — I5033 Acute on chronic diastolic (congestive) heart failure: Secondary | ICD-10-CM

## 2014-02-14 DIAGNOSIS — J9620 Acute and chronic respiratory failure, unspecified whether with hypoxia or hypercapnia: Secondary | ICD-10-CM | POA: Insufficient documentation

## 2014-02-14 LAB — GLUCOSE, CAPILLARY: GLUCOSE-CAPILLARY: 189 mg/dL — AB (ref 70–99)

## 2014-02-14 NOTE — Progress Notes (Signed)
Patient ID: [name], female   DOB: 11-Nov-1940, 73 y.o.   MRN: [id number]  Facility; [facility] Chief complaint; admission to SNF post admit to [facility] from 7/2 to 7/9 with fractures of her left hip rt. Distal fibula and right great toe fracture. She was then readmitted to the hospital from 7/10 through 02/13/2014 with acute respiratory failure secondary to acute diastolic heart failure  History; this is a 73 year old woman who lives in her own home with her husband. She uses a "walking cane". There had been a history of increasing unsteadiness with her walking and perhaps for mechanical falls over the course of this year. On the day of admission she fell in her kitchen, family has been doing renovations. She suffered a left hip fracture which underwent a left ORIF on 7/3. She also has a right distal fibula and First metatarsophalangeal joint fracture.   The patient was admitted here on 7/9 however she became increasingly confused and shortness short of breath the. She was readmitted to hospital from 7/10 through 7/12. She was diagnosed with acute respiratory failure with respiratory acidosis and hypercapnia. However the fact that she had a elevated bicarbonate suggests that this happens 72 hours prior to this. She was also felt to have consequences of narcotics and benzodiazepines. 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 An echocardiogram done early in the first admission showed moderate to severe LVH, functionally bicuspid aortic valve. Normal EF. Normal right-sided parameters. She was placed on Lasix.  Past Medical History  Diagnosis Date  . HTN (hypertension)   . Diabetes mellitus without complication   . High cholesterol   . Spasm of back muscles    Past Surgical History  Procedure Laterality Date  . Intramedullary (im) nail intertrochanteric Left 02/04/2014    Procedure: INTRAMEDULLARY (IM) NAIL INTERTROCHANTRIC FEMORAL;  Surgeon: Mauri Pole, MD;  Location:  [place];  Service: Orthopedics;  Laterality: Left;  . Orif toe fracture Right 02/08/2014    Procedure: OPEN REDUCTION INTERNAL FIXATION Right METATARSAL  FRACTURE ;  Surgeon: Wylene Simmer, MD;  Location: [place];  Service: Orthopedics;  Laterality: Right;   Current Outpatient Prescriptions on File Prior to Visit  Medication Sig Dispense Refill  . acetaminophen (TYLENOL) 325 MG tablet Take 2 tablets (650 mg total) by mouth every 6 (six) hours as needed for mild pain.      [name] amitriptyline (ELAVIL) 10 MG tablet Take 10 mg by mouth at bedtime.      [name] aspirin EC 81 MG tablet Take 162 mg by mouth daily.      [name] atorvastatin (LIPITOR) 20 MG tablet Take 20 mg by mouth at bedtime.      . carvedilol (COREG) 3.125 MG tablet Take 1 tablet (3.125 mg total) by mouth 2 (two) times daily with a meal.      . cetirizine (ZYRTEC) 10 MG tablet Take 10 mg by mouth daily as needed for allergies.      [name] docusate sodium (COLACE) 100 MG capsule Take 100 mg by mouth 2 (two) times daily.      [name] enoxaparin (LOVENOX) 40 MG/0.4ML injection Inject 0.4 mLs (40 mg total) into the skin daily. For DVT prophylaxis s/p hip surgery, recommend 30 days post-op then reassess need for any further treatment.  0 Syringe    . fluticasone (FLONASE) 50 MCG/ACT nasal spray Place 1 spray into both nostrils daily.      . furosemide (LASIX) 20 MG tablet Take 1 tablet (20  mg total) by mouth daily.      [name] gabapentin (NEURONTIN) 300 MG capsule Take 300 mg by mouth 2 (two) times daily.      . hydrALAZINE (APRESOLINE) 25 MG tablet Take 1 tablet (25 mg total) by mouth 4 (four) times daily.      . hydrocortisone (ANUSOL-HC) 2.5 % rectal cream Apply 1 application topically 4 (four) times daily.      . insulin aspart (NOVOLOG) 100 UNIT/ML injection Inject 0-15 Units into the skin 3 (three) times daily with meals. Before each meal 3 times a day, 140-199 - 3 units, 200-250 - 5 units, 251-299 - 8 units,  300-349 - 12 units,  350 or above 15 units.  10 mL  11  .  insulin detemir (LEVEMIR) 100 UNIT/ML injection Inject 0.4 mLs (40 Units total) into the skin 2 (two) times daily.  10 mL  11  . losartan (COZAAR) 100 MG tablet Take 1 tablet (100 mg total) by mouth daily.      . meloxicam (MOBIC) 15 MG tablet Take 15 mg by mouth daily as needed for pain.      [name] omeprazole (PRILOSEC) 20 MG capsule Take 20 mg by mouth daily.      . polyethylene glycol (MIRALAX / GLYCOLAX) packet Take 17 g by mouth 2 (two) times daily.  14 each  0      Social; patient lives in her own home in [place] with her husband. She used a walking cane although on a recent trip to the beach she was in a wheelchair. She is a lifetime nonsmoker. 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 She is not on oxygen at home  reports that she has never smoked. She has never used smokeless tobacco. She reports that she does not drink alcohol or use illicit drugs.  Family history; none available from the patient  Review of systems Respiratory; states she has a history of ""chronic bronchitis", sinusitis Cardiac; other than hypertension no history of chest pain.  GI as; states she has not had a bowel movement in 3 days GU no dysuria  Physical examination Gen. patient is not in any overt distress, engages me in conversation which seems reasonably accurate Vitals; blood pressure 139/50 respirations 18 pulse 68 temperature 98.2 O2 sat is 95% on 2 L weight is 228.3 pounds comparing with 223.2 pounds on 7/10 and 228.1 pounds on 7/9 HEENT no oral lesions are seen Respiratory bibasilar crackles are still present no wheezing breath sounds are reduced Cardiac; heart sounds reveal a soft midsystolic high-pitched murmur which appears to radiate into both carotids and/or this may represent a carotid bruit bilaterally. I actually favor the former. JVP is not elevated there is no S3. She does have edema in both arms greater than both legs  especially the right Abdomen; somewhat distended however no liver spleen or masses noted GU bladder is not distended there is no costovertebral angle tenderness Extremities; her left hip incision has some eschar to the upper incision. However there is no evidence of infection. 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 She has a cast on the right lower extremity  Neurologic; upper extremity strength is mildly reduced but equal bilaterally. She has 3+ out of 5 hip flexor on the right. There is also 4/5 weakness in ankle dorsi and plantar flexion on the left. Reflexes are absent at the knee jerk on the left absent brachial radialis absent triceps  but 2+ biceps bilaterally. I think she also has some sensory loss in the lower extremities  Impressions/plan #1 acute on chronic respiratory failure. The acute part of this may be narcotics and/or diastolically mediated congestive heart failure. I reviewed her chest x-ray which certainly does have the appearance of pulmonary edema. Her echocardiogram showed LVH which was moderate to severe a functional bicuspid aortic valve but otherwise good left and right ventricular function. She was put on Lasix. Lab work from today shows a CO2 of 58, I suspect this patient had some form of chronic compensated respiratory acidosis even prior to this most recent event. One would have to wonder about obstructive sleep apnea, some form of chronic obstructive lung disease, or even asthma. If we can stabilize her now here she will need PFTs at some point a repeat arterial blood gas #2 acute delirium related to #1. This appears to be mostly resolved. Her urine culture was negative #3 type 2 diabetes on insulin. Patient states that she gave herself her own insulin at home #4 leukocytosis on her prior admission to this facility am not sure the cause of this was ever really determined #5 probable significant diabetic neuropathy with lower extremity weakness even in dorsi and plantar  flexion on the left and hip flexion on the right [it shouldn't be pain related to her fractures] there may be other reasons behind her gait difficulties although this certainly seems to be the most likely cause at the bedside #6 hypertension with left ventricular hypertrophy this will need to be meticulously controlled her  This patient has a very significantly difficult. Of rehabilitation ahead of her. She has bilateral lower extremity flexion contractures on top of what I expect to be a significant gait impairment related to diabetic neuropathy. As stated above there may be other explanations here over this seems to be most likely.  As noted I suspect this lady has some form of chronic respiratory acidosis. Her cardiopulmonary status will need to be carefully monitored while she is here.

## 2014-02-15 LAB — GLUCOSE, CAPILLARY
GLUCOSE-CAPILLARY: 80 mg/dL (ref 70–99)
GLUCOSE-CAPILLARY: 109 mg/dL — AB (ref 70–99)
GLUCOSE-CAPILLARY: 324 mg/dL — AB (ref 70–99)
Glucose-Capillary: 133 mg/dL — ABNORMAL HIGH (ref 70–99)
Glucose-Capillary: 260 mg/dL — ABNORMAL HIGH (ref 70–99)
Glucose-Capillary: 218 mg/dL — ABNORMAL HIGH (ref 70–99)
Glucose-Capillary: 334 mg/dL — ABNORMAL HIGH (ref 70–99)

## 2014-02-16 ENCOUNTER — Non-Acute Institutional Stay (SKILLED_NURSING_FACILITY): Payer: Medicare HMO | Admitting: Internal Medicine

## 2014-02-16 DIAGNOSIS — I5033 Acute on chronic diastolic (congestive) heart failure: Secondary | ICD-10-CM

## 2014-02-16 DIAGNOSIS — J9620 Acute and chronic respiratory failure, unspecified whether with hypoxia or hypercapnia: Secondary | ICD-10-CM

## 2014-02-16 DIAGNOSIS — I110 Hypertensive heart disease with heart failure: Secondary | ICD-10-CM

## 2014-02-16 DIAGNOSIS — I509 Heart failure, unspecified: Secondary | ICD-10-CM

## 2014-02-16 LAB — GLUCOSE, CAPILLARY
GLUCOSE-CAPILLARY: 182 mg/dL — AB (ref 70–99)
Glucose-Capillary: 365 mg/dL — ABNORMAL HIGH (ref 70–99)
Glucose-Capillary: 131 mg/dL — ABNORMAL HIGH (ref 70–99)
Glucose-Capillary: 245 mg/dL — ABNORMAL HIGH (ref 70–99)

## 2014-02-17 LAB — GLUCOSE, CAPILLARY
GLUCOSE-CAPILLARY: 133 mg/dL — AB (ref 70–99)
GLUCOSE-CAPILLARY: 203 mg/dL — AB (ref 70–99)
Glucose-Capillary: 224 mg/dL — ABNORMAL HIGH (ref 70–99)

## 2014-02-18 LAB — GLUCOSE, CAPILLARY
GLUCOSE-CAPILLARY: 227 mg/dL — AB (ref 70–99)
GLUCOSE-CAPILLARY: 165 mg/dL — AB (ref 70–99)
Glucose-Capillary: 142 mg/dL — ABNORMAL HIGH (ref 70–99)

## 2014-02-18 NOTE — Progress Notes (Signed)
UR chart review completed.  

## 2014-02-20 LAB — GLUCOSE, CAPILLARY
GLUCOSE-CAPILLARY: 204 mg/dL — AB (ref 70–99)
Glucose-Capillary: 203 mg/dL — ABNORMAL HIGH (ref 70–99)
Glucose-Capillary: 118 mg/dL — ABNORMAL HIGH (ref 70–99)
Glucose-Capillary: 91 mg/dL (ref 70–99)
Glucose-Capillary: 117 mg/dL — ABNORMAL HIGH (ref 70–99)

## 2014-02-21 ENCOUNTER — Non-Acute Institutional Stay (SKILLED_NURSING_FACILITY): Payer: Medicare HMO | Admitting: Internal Medicine

## 2014-02-21 DIAGNOSIS — I5033 Acute on chronic diastolic (congestive) heart failure: Secondary | ICD-10-CM

## 2014-02-21 DIAGNOSIS — L03119 Cellulitis of unspecified part of limb: Secondary | ICD-10-CM

## 2014-02-21 DIAGNOSIS — I509 Heart failure, unspecified: Secondary | ICD-10-CM

## 2014-02-21 DIAGNOSIS — L02619 Cutaneous abscess of unspecified foot: Secondary | ICD-10-CM

## 2014-02-21 LAB — GLUCOSE, CAPILLARY
GLUCOSE-CAPILLARY: 127 mg/dL — AB (ref 70–99)
GLUCOSE-CAPILLARY: 121 mg/dL — AB (ref 70–99)
GLUCOSE-CAPILLARY: 99 mg/dL (ref 70–99)
Glucose-Capillary: 107 mg/dL — ABNORMAL HIGH (ref 70–99)
Glucose-Capillary: 150 mg/dL — ABNORMAL HIGH (ref 70–99)
Glucose-Capillary: 98 mg/dL (ref 70–99)
Glucose-Capillary: 131 mg/dL — ABNORMAL HIGH (ref 70–99)

## 2014-02-21 NOTE — Progress Notes (Addendum)
Patient ID: [name], female   DOB: 1941-06-21, 73 y.o.   MRN: [id number]               PROGRESS NOTE  DATE:  02/16/2014     FACILITY: [facility]    LEVEL OF CARE:   SNF   Acute Visit   HISTORY OF PRESENT ILLNESS:  This is a patient whom I saw for the first time two days ago.  She had initially come to this facility after having a fall and suffering right hip, right distal fibula, and right first metatarsal joint fractures.    She was readmitted to hospital on 02/11/2014 through 02/13/2014 with acute  respiratory failure with respiratory acidosis and hypercapnia.  She also has an elevated bicarbonate on her metabolic panel.  All of this was felt to be a consequence of narcotics, benzodiazepines, diastolically-mediated heart failure.  Previous echocardiogram done prior to her first admission showed moderate to severe LVH, a functionally bicuspid aortic valve with a normal ejection fraction and normal right-sided parameters.  She was put on a small dose of Lasix at 20 mg.    REVIEW OF SYSTEMS:   GENERAL:  The patient appears to be doing better.  Her weight has been stable at the 228 range.   CHEST/RESPIRATORY:  She has oxygen on.  She has not been on long-term oxygen.  This may be possible to taper off.   CARDIAC:   She is not complaining of chest pain.   GI:  She is complaining of constipation.    PHYSICAL EXAMINATION:   VITAL SIGNS:   O2 SATURATIONS:  95% on 2 L.   PULSE:  86.    RESPIRATIONS:  18 and unlabored.   CHEST/RESPIRATORY:  Shallow, but otherwise clear air entry bilaterally.   CARDIOVASCULAR:  CARDIAC:   Heart sounds are normal.  JVP is not elevated.  She has a soft murmur that appears to radiate into both carotids.  There is no S3.   EDEMA/VARICOSITIES:  She appears to have mild widespread edema.    ASSESSMENT/PLAN:  Acute-on-chronic  respiratory failure.  I wonder if this lady has some other respiratory issue, obesity hypoventilation, etc.  She does not  have a smoking history.     Diastolically-mediated heart failure.   Although she is not losing weight, she appears to have less edema.    I suspect chronic hypercapnia with compensatory elevation of her bicarbonate.  I am not going to push her Lasix aggressively for this reason.    LVH.  Presumably related to poorly controlled hypertension.  Her blood pressures appear to be well controlled here, mostly in the 809X systolic range.    Type 2 diabetes.  On insulin.  Quite a variable list of blood sugars here.  Yesterday's values were 109/218/137/324.  We will continue to monitor this for the moment.    I am going to slightly increase her Lasix to 30 mg. I suspect this woman is going to require a taper of her oxygen, probably arterial blood gases, and at some point a sleep study.  I would also wonder if she might require pulmonary function tests.

## 2014-02-22 ENCOUNTER — Non-Acute Institutional Stay (SKILLED_NURSING_FACILITY): Payer: Medicare HMO | Admitting: Internal Medicine

## 2014-02-22 DIAGNOSIS — S8290XD Unspecified fracture of unspecified lower leg, subsequent encounter for closed fracture with routine healing: Secondary | ICD-10-CM

## 2014-02-22 DIAGNOSIS — S82401D Unspecified fracture of shaft of right fibula, subsequent encounter for closed fracture with routine healing: Secondary | ICD-10-CM

## 2014-02-22 DIAGNOSIS — R5381 Other malaise: Secondary | ICD-10-CM

## 2014-02-22 DIAGNOSIS — R5383 Other fatigue: Secondary | ICD-10-CM

## 2014-02-22 DIAGNOSIS — S72002D Fracture of unspecified part of neck of left femur, subsequent encounter for closed fracture with routine healing: Secondary | ICD-10-CM

## 2014-02-22 DIAGNOSIS — R531 Weakness: Secondary | ICD-10-CM

## 2014-02-22 DIAGNOSIS — S72009D Fracture of unspecified part of neck of unspecified femur, subsequent encounter for closed fracture with routine healing: Secondary | ICD-10-CM

## 2014-02-22 DIAGNOSIS — R42 Dizziness and giddiness: Secondary | ICD-10-CM

## 2014-02-22 LAB — GLUCOSE, CAPILLARY
GLUCOSE-CAPILLARY: 134 mg/dL — AB (ref 70–99)
GLUCOSE-CAPILLARY: 181 mg/dL — AB (ref 70–99)
GLUCOSE-CAPILLARY: 253 mg/dL — AB (ref 70–99)
GLUCOSE-CAPILLARY: 254 mg/dL — AB (ref 70–99)

## 2014-02-22 NOTE — Progress Notes (Signed)
Patient ID: [name], female   DOB: 04/28/1941, 73 y.o.   MRN: [id number]   This is an acute visit.  Level of care skilled.  Facility [facility].  Chief complaint acute visit secondary patient feeling weak possibly dizzy.  History of present illness.  Patient is a 73 year old female with a somewhat complicated medical history including fractures of her left hip right distal fibula and right great controlled-she also had acute respiratory failure secondary to acute diastolic CHF.  She was admitted here on July 9 but became acutely confused and short of breath she was readmitted to the hospital diagnosed with acute respiratory failure with respiratory acidosis and hypercapnia-also thought she may have had an adverse effect to narcotics and benzodiazepines-.  Since she's returned she's been apparently at her baseline right now her back however this afternoon she complained somewhat of being weak in dizzyand sleepy  -talking with her apparently this dizziness has been, on and off for the past few days-she did receive Norco 12/06/2023 earlier today and there is some suspicion this may be contributing to it.  Her vital signs continued to be stable-I did reevaluate her a bit later she said the dizziness was getting better she did not complain of any chest pain EKG was done which showed normal sinus rhythm.  Family medical social history as been reviewed per admission note on 02/14/2014.  Medications have been reviewed per MAR.  Review of systems.  In general complaints of fever or chills but says she feels weak.  Skin does not complain of diaphoresis rash or itching.  Respiratory does not complaining of shortness of breath has a history of chronic bronchitis sinusitis-does not complaining of a cough.  Cardiac does not complaining of chest pain.  GI is not complaining of any nausea vomiting or abdominal discomfort does complain of constipation at times although apparently recently had a large  bowel movement.  GU is not complaining of dysuria.  Muscle skeletal has a fairly extensive history of pain here which has been a challenging situation with apparently her adverse affects with strong pain medication-she is not specifically complaining of pain currently her main concern is her weakness.  Neurologic is not complaining of syncopal-type feelings but does state she has some dizziness apparently this is somewhat intermittent over the past few days again when I reevaluated her her dizziness apparently was a bit better but she did complain of being sleepy  Psych she does appear alert and oriented certainly more bright and alert than when I saw her on her initial admission before she went to the ER.  Physical exam.  She is afebrile pulse of 84 respirations 19 blood pressure 120/54 CBG is 253 O2 saturation is 97% on 2 L via nasal cannula.  In general this is a somewhat obese elderly female in no distress sitting comfortably in her wheelchair.  Her skin is warm and dry.--  Eyes pupils appear reactive to light bilaterally visual acuity appears intact sclera and conjunctiva are clear.  Oral pharynx is clear mucous membranes moist tongue is midline with full range of motion.  Chest is clear to auscultation no labored breathing.  Heart is regular rate and rhythm with a 2/6 systolic murmur this appears to be chronic.  She has some edema of her arms but this does not appear greater than her baseline as well as some lower extremity edema which appears baseline as well.  Abdomen is obese soft nontender with positive bowel sounds.  Neurologic is grossly intact  her speech is clear cranial nerves intact tongue is midline her strength appears strong she has some lower extremity weakness but this does not appear to be anything new.  Psych she is alert and oriented pleasant and appropriate certainly more alert than when I initially saw her last week.  Labs.  02/16/2014.  WBC 9.7  hemoglobin 11.1 platelets 217.  Sodium 139 potassium 3.8 BUN 20 creatinine 0.68.  EKG did show sinus rhythm this was ordered today after I evaluated her.  Assessment and plan.  Dizziness with weakness-there could be numerous etiologies here with her medical history-EKG was done which shows normal sinus rhythm-she is not complaining of any chest pain or increased shortness of breath-at this point will monitor while signs every 4 hours with neuro checks-also obtain a stat CBC CMP TSH and ammonia level   There is some suspicion that her  pain meds contributing to this-I did discuss this with Dr. Dellia Nims via phone-- secondary to her significant pain issues with her above mentioned fractures Will continue the Norco 5/325 mg every 6 hours when necessary-although this will have to be monitored again this is quite a challenging situation  Of note I did reevaluate patient during the afternoon-she continued to be stable says her dizziness was somewhat better certainly did not appear to be declining at this point will continue to monitor-physical exam remained essentially unchanged.  [id number] note greater than 35 minutes spent assessing patient and reassessing her-reviewing her medical records-discussing her status with nursing staff-and coordinating and formulating a plan--of note greater than 50% of time spent coordinating plan of care

## 2014-02-23 ENCOUNTER — Encounter: Payer: Self-pay | Admitting: Internal Medicine

## 2014-02-23 ENCOUNTER — Non-Acute Institutional Stay (SKILLED_NURSING_FACILITY): Payer: Medicare HMO | Admitting: Internal Medicine

## 2014-02-23 DIAGNOSIS — L03119 Cellulitis of unspecified part of limb: Secondary | ICD-10-CM

## 2014-02-23 DIAGNOSIS — I5033 Acute on chronic diastolic (congestive) heart failure: Secondary | ICD-10-CM

## 2014-02-23 DIAGNOSIS — I509 Heart failure, unspecified: Secondary | ICD-10-CM

## 2014-02-23 DIAGNOSIS — J9620 Acute and chronic respiratory failure, unspecified whether with hypoxia or hypercapnia: Secondary | ICD-10-CM

## 2014-02-23 DIAGNOSIS — L02619 Cutaneous abscess of unspecified foot: Secondary | ICD-10-CM

## 2014-02-23 LAB — GLUCOSE, CAPILLARY
GLUCOSE-CAPILLARY: 133 mg/dL — AB (ref 70–99)
GLUCOSE-CAPILLARY: 204 mg/dL — AB (ref 70–99)
Glucose-Capillary: 227 mg/dL — ABNORMAL HIGH (ref 70–99)
Glucose-Capillary: 239 mg/dL — ABNORMAL HIGH (ref 70–99)

## 2014-02-23 NOTE — Progress Notes (Addendum)
Patient ID: [name], female   DOB: 1940-09-30, 73 y.o.   MRN: [id number]               PROGRESS NOTE  DATE:  02/21/2014        FACILITY: [facility]    LEVEL OF CARE:   SNF   Acute Visit   CHIEF COMPLAINT:  Significant number of complaints, including pain with therapy.    HISTORY OF PRESENT ILLNESS:  This is a patient who was admitted to [facility] from 02/03/2014 through 02/10/2014 with fractures of her left hip, her right distal fibula, and her right great toe.  She then had to be readmitted to hospital from this facility with acute  respiratory failure, felt secondary to diastolic heart failure, narcotics and possibly benzodiazepines.  Her echocardiogram done during the first admission showed moderate to severe LVH and a functionally bicuspid aortic valve, but normal ejection fraction.  She had normal right-sided parameters.  She was placed on Lasix.  Today, she has a real litany of complaints including bilateral numbness in her lower extremities, especially her left foot.   She has pain in the right foot.    PHYSICAL EXAMINATION:   VITAL SIGNS:   O2 SATURATIONS:  Pulse ox is 98%.   PULSE:  92.   RESPIRATIONS:  18 and unlabored.   GENERAL APPEARANCE:  The patient is not in any distress.   CHEST/RESPIRATORY:  Clear air entry bilaterally.   CARDIOVASCULAR:  CARDIAC:   Heart sounds are normal.  There are no murmurs.  She appears to be euvolemic.   MUSCULOSKELETAL:   EXTREMITIES:   RIGHT LOWER EXTREMITY:  I removed the CAM boot that she is in.  There is warmth and what appears to be erythema or perhaps old bruising around her surgical site in the right great toe.   I marked this area.    ASSESSMENT/PLAN:  ?Cellulitis of the right foot around the surgical site.  I am empirically going to put her on antibiotics- Doxycycline   Diastolically-mediated congestive heart failure (see discussion above).  I see no evidence of this at the bedside.      Probable diabetic  neuropathy in a classic stocking and glove distribution, according to her today.    Pain at physical therapy.  She  apparently did not tolerate oxycodone.   In fact, this was felt to contribute to her readmission to hospital.  I will try her on a small dose of hydrocodone p.r.n.

## 2014-02-24 LAB — GLUCOSE, CAPILLARY
GLUCOSE-CAPILLARY: 93 mg/dL (ref 70–99)
Glucose-Capillary: 175 mg/dL — ABNORMAL HIGH (ref 70–99)

## 2014-02-25 LAB — GLUCOSE, CAPILLARY
GLUCOSE-CAPILLARY: 246 mg/dL — AB (ref 70–99)
GLUCOSE-CAPILLARY: 264 mg/dL — AB (ref 70–99)
GLUCOSE-CAPILLARY: 184 mg/dL — AB (ref 70–99)
GLUCOSE-CAPILLARY: 251 mg/dL — AB (ref 70–99)

## 2014-02-26 LAB — GLUCOSE, CAPILLARY
Glucose-Capillary: 120 mg/dL — ABNORMAL HIGH (ref 70–99)
Glucose-Capillary: 145 mg/dL — ABNORMAL HIGH (ref 70–99)
Glucose-Capillary: 274 mg/dL — ABNORMAL HIGH (ref 70–99)

## 2014-02-27 LAB — GLUCOSE, CAPILLARY
GLUCOSE-CAPILLARY: 162 mg/dL — AB (ref 70–99)
GLUCOSE-CAPILLARY: 266 mg/dL — AB (ref 70–99)
GLUCOSE-CAPILLARY: 202 mg/dL — AB (ref 70–99)
Glucose-Capillary: 252 mg/dL — ABNORMAL HIGH (ref 70–99)
Glucose-Capillary: 140 mg/dL — ABNORMAL HIGH (ref 70–99)

## 2014-02-28 LAB — GLUCOSE, CAPILLARY
Glucose-Capillary: 86 mg/dL (ref 70–99)
Glucose-Capillary: 121 mg/dL — ABNORMAL HIGH (ref 70–99)
Glucose-Capillary: 235 mg/dL — ABNORMAL HIGH (ref 70–99)
Glucose-Capillary: 202 mg/dL — ABNORMAL HIGH (ref 70–99)

## 2014-02-28 NOTE — Progress Notes (Addendum)
Patient ID: [name], female   DOB: [birth year], 73 y.o.   MRN: [id number]               PROGRESS NOTE  DATE:  02/23/2014    FACILITY: [facility]    LEVEL OF CARE:   SNF   Acute Visit   CHIEF COMPLAINT:  Follow up right foot cellulitis, other issues.    HISTORY OF PRESENT ILLNESS:  This is a patient who came to [place] with fractures of her left hip, right distal fibula, and her right great toe.  She had to be readmitted to hospital shortly after her arrival here with acute-on-chronic  respiratory failure secondary to diastolic heart failure, narcotics and/or benzodiazepines.  She had an echocardiogram during the first admission showing severe LVH and a functional bicuspid aortic valve.  She had normal right-sided parameters and she was put on Lasix.    I saw her two days ago.  I was concerned about some degree of erythema and warmth around the incision on her right foot.  I put her on doxycycline and I am following this, along with other issues.    The patient remains on 2 L of oxygen.  Every time I measure this, her oxygen sats are in the high 90s.  She does not have a diagnosis of underlying chronic lung disease that I know about.  However, she does appear to have elevation in her total CO2, presumably as a consequence of hypercapnia.  I think this lady ultimately should have pulmonary function tests.  She has no smoking history.   If the pulmonary function tests do not show a reason for this, I would wonder about a sleep study.     PHYSICAL EXAMINATION:   VITAL SIGNS:   O2 SATURATIONS:  99% on 2 L.   GENERAL APPEARANCE:  The patient is not in any distress, conversational.  She has the usual litany of complaints.    CHEST/RESPIRATORY:  Shallow, but otherwise clear air entry.  There are no crackles or wheezes.   MUSCULOSKELETAL:   EXTREMITIES:   RIGHT LOWER EXTREMITY:  Right foot:   This looks considerably better than the other day.  Where I marked the degree of erythema and  warmth, it appears to be regressing.  I will continue the doxycycline and follow this up next Monday.      ASSESSMENT/PLAN:  Cellulitis around the surgical site.  It appears to be resolving.    Acute-on-chronic  respiratory failure.  I really do not have a sense of this.  Certainly, the acute component of this was felt to be due to diastolic heart failure and narcotics.  Why she has chronic respiratory failure is not really clear.  Lab work from today showed a normal basic metabolic panel except for a total CO2 of 33.  Her CBC was also normal.    Hypoxemia.  I am going to taper her oxygen.  She does not have a history of smoking or obstructive lung disease.  She was not on oxygen.

## 2014-03-02 ENCOUNTER — Other Ambulatory Visit: Payer: Self-pay | Admitting: *Deleted

## 2014-03-02 LAB — GLUCOSE, CAPILLARY
GLUCOSE-CAPILLARY: 159 mg/dL — AB (ref 70–99)
Glucose-Capillary: 108 mg/dL — ABNORMAL HIGH (ref 70–99)
Glucose-Capillary: 76 mg/dL (ref 70–99)
Glucose-Capillary: 165 mg/dL — ABNORMAL HIGH (ref 70–99)
Glucose-Capillary: 164 mg/dL — ABNORMAL HIGH (ref 70–99)

## 2014-03-02 MED ORDER — HYDROCODONE-ACETAMINOPHEN 5-325 MG PO TABS: 1.0000 | ORAL_TABLET | Freq: Four times a day (QID) | ORAL | Status: DC | PRN

## 2014-03-02 NOTE — Telephone Encounter (Signed)
[facility] 

## 2014-03-03 LAB — GLUCOSE, CAPILLARY
GLUCOSE-CAPILLARY: 51 mg/dL — AB (ref 70–99)
Glucose-Capillary: 75 mg/dL (ref 70–99)
Glucose-Capillary: 170 mg/dL — ABNORMAL HIGH (ref 70–99)
Glucose-Capillary: 255 mg/dL — ABNORMAL HIGH (ref 70–99)

## 2014-03-04 LAB — GLUCOSE, CAPILLARY
GLUCOSE-CAPILLARY: 263 mg/dL — AB (ref 70–99)
GLUCOSE-CAPILLARY: 206 mg/dL — AB (ref 70–99)
Glucose-Capillary: 121 mg/dL — ABNORMAL HIGH (ref 70–99)

## 2014-03-05 LAB — GLUCOSE, CAPILLARY
Glucose-Capillary: 56 mg/dL — ABNORMAL LOW (ref 70–99)
Glucose-Capillary: 54 mg/dL — ABNORMAL LOW (ref 70–99)
Glucose-Capillary: 160 mg/dL — ABNORMAL HIGH (ref 70–99)
Glucose-Capillary: 238 mg/dL — ABNORMAL HIGH (ref 70–99)

## 2014-03-06 LAB — GLUCOSE, CAPILLARY
GLUCOSE-CAPILLARY: 112 mg/dL — AB (ref 70–99)
Glucose-Capillary: 152 mg/dL — ABNORMAL HIGH (ref 70–99)

## 2014-03-07 LAB — GLUCOSE, CAPILLARY
GLUCOSE-CAPILLARY: 199 mg/dL — AB (ref 70–99)
GLUCOSE-CAPILLARY: 193 mg/dL — AB (ref 70–99)
GLUCOSE-CAPILLARY: 198 mg/dL — AB (ref 70–99)
Glucose-Capillary: 157 mg/dL — ABNORMAL HIGH (ref 70–99)
Glucose-Capillary: 196 mg/dL — ABNORMAL HIGH (ref 70–99)

## 2014-03-08 LAB — GLUCOSE, CAPILLARY
GLUCOSE-CAPILLARY: 107 mg/dL — AB (ref 70–99)
Glucose-Capillary: 65 mg/dL — ABNORMAL LOW (ref 70–99)
Glucose-Capillary: 124 mg/dL — ABNORMAL HIGH (ref 70–99)
Glucose-Capillary: 126 mg/dL — ABNORMAL HIGH (ref 70–99)

## 2014-03-09 ENCOUNTER — Non-Acute Institutional Stay (SKILLED_NURSING_FACILITY): Payer: Medicare HMO | Admitting: Internal Medicine

## 2014-03-09 DIAGNOSIS — S72009S Fracture of unspecified part of neck of unspecified femur, sequela: Secondary | ICD-10-CM

## 2014-03-09 DIAGNOSIS — G609 Hereditary and idiopathic neuropathy, unspecified: Secondary | ICD-10-CM

## 2014-03-09 DIAGNOSIS — K649 Unspecified hemorrhoids: Secondary | ICD-10-CM

## 2014-03-09 DIAGNOSIS — S7222XS Displaced subtrochanteric fracture of left femur, sequela: Secondary | ICD-10-CM

## 2014-03-09 DIAGNOSIS — K648 Other hemorrhoids: Secondary | ICD-10-CM

## 2014-03-09 LAB — GLUCOSE, CAPILLARY: Glucose-Capillary: 217 mg/dL — ABNORMAL HIGH (ref 70–99)

## 2014-03-10 LAB — GLUCOSE, CAPILLARY
GLUCOSE-CAPILLARY: 207 mg/dL — AB (ref 70–99)
GLUCOSE-CAPILLARY: 194 mg/dL — AB (ref 70–99)
Glucose-Capillary: 138 mg/dL — ABNORMAL HIGH (ref 70–99)
Glucose-Capillary: 164 mg/dL — ABNORMAL HIGH (ref 70–99)

## 2014-03-11 LAB — GLUCOSE, CAPILLARY
Glucose-Capillary: 101 mg/dL — ABNORMAL HIGH (ref 70–99)
Glucose-Capillary: 158 mg/dL — ABNORMAL HIGH (ref 70–99)
Glucose-Capillary: 219 mg/dL — ABNORMAL HIGH (ref 70–99)

## 2014-03-12 LAB — GLUCOSE, CAPILLARY
GLUCOSE-CAPILLARY: 230 mg/dL — AB (ref 70–99)
Glucose-Capillary: 96 mg/dL (ref 70–99)
Glucose-Capillary: 125 mg/dL — ABNORMAL HIGH (ref 70–99)
Glucose-Capillary: 144 mg/dL — ABNORMAL HIGH (ref 70–99)

## 2014-03-13 LAB — GLUCOSE, CAPILLARY
GLUCOSE-CAPILLARY: 70 mg/dL (ref 70–99)
GLUCOSE-CAPILLARY: 136 mg/dL — AB (ref 70–99)
GLUCOSE-CAPILLARY: 163 mg/dL — AB (ref 70–99)
Glucose-Capillary: 200 mg/dL — ABNORMAL HIGH (ref 70–99)

## 2014-03-14 DIAGNOSIS — G609 Hereditary and idiopathic neuropathy, unspecified: Secondary | ICD-10-CM | POA: Insufficient documentation

## 2014-03-14 LAB — GLUCOSE, CAPILLARY
GLUCOSE-CAPILLARY: 158 mg/dL — AB (ref 70–99)
GLUCOSE-CAPILLARY: 108 mg/dL — AB (ref 70–99)
Glucose-Capillary: 71 mg/dL (ref 70–99)
Glucose-Capillary: 120 mg/dL — ABNORMAL HIGH (ref 70–99)

## 2014-03-14 NOTE — Progress Notes (Signed)
Patient ID: [name], female   DOB: [birth year], 73 y.o.   MRN: [id number]           PROGRESS NOTE  DATE: 03/09/2014           FACILITY:  [facility]  LEVEL OF CARE: SNF (31)  Acute Visit  CHIEF COMPLAINT:  Manage neuropathy, hemorrhoids, and left hip fracture.    HISTORY OF PRESENT ILLNESS: I was requested by the staff to assess the patient regarding above problem(s):  PERIPHERAL NEUROPATHY:  I was requested by the pharmacy consultant to assess the patient for possible discontinuation of amitriptyline since it is a [employer] drug.  She is also on Neurontin 300 mg  b.i.d.  Patient complains of numbness in her extremities.  The peripheral neuropathy is stable. The patient denies pain in the feet, tingling. No complications noted from the medication presently being used.     HEMORRHOIDS:  Patient is currently receiving Anusol HC cream rectally q.i.d.   Pharmacy consultant recommends changing to p.r.n.     HIP FRACTURE:  I was requested by the pharmacy consultant to assess the patient to decide a stop date for lovenox.   The patient had a mechanical fall and sustained a femur fracture.  Patient subsequently underwent surgical repair and tolerated the procedure well. Patient is admitted to this facility for short-term rehabilitation. Patient denies hip pain currently. No complications reported from the pain medications currently being used.    PAST MEDICAL HISTORY : Reviewed.  No changes/see problem list  CURRENT MEDICATIONS: Reviewed per MAR/see medication list  REVIEW OF SYSTEMS:  GENERAL: no change in appetite, no fatigue, no weight changes, no fever, chills or weakness RESPIRATORY: no cough, SOB, DOE,, wheezing, hemoptysis CARDIAC: no chest pain or palpitations; left lower extremity swelling     GI: no abdominal pain, diarrhea, constipation, heart burn, nausea or vomiting  PHYSICAL EXAMINATION  VS: see VS section  GENERAL: no acute distress, moderately obese body  habitus EYES: conjunctivae normal, sclerae normal, normal eye lids NECK: supple, trachea midline, no neck masses, no thyroid tenderness, no thyromegaly LYMPHATICS: no LAN in the neck, no supraclavicular LAN      RESPIRATORY: breathing is even & unlabored, BS CTAB CARDIAC: RRR, no murmur,no extra heart sounds, +3 left lower extremity edema    GI: abdomen soft, normal BS, no masses, no tenderness, no hepatomegaly, no splenomegaly PSYCHIATRIC: the patient is alert & oriented to person, affect & behavior appropriate  ASSESSMENT/PLAN:  Peripheral neuropathy.  We will taper off amitriptyline and increase Neurontin to 300 mg  t.i.d.    Hemorrhoids.  The patient is okay changing Anusol cream to p.r.n.     Hip fracture.  Would refer stop date for lovenox to orthopedic surgeon.    CPT CODE: 48546            Jadrian Bulman Y Quintina Hakeem, [place] [phone]

## 2014-03-15 LAB — GLUCOSE, CAPILLARY
GLUCOSE-CAPILLARY: 149 mg/dL — AB (ref 70–99)
Glucose-Capillary: 152 mg/dL — ABNORMAL HIGH (ref 70–99)
Glucose-Capillary: 208 mg/dL — ABNORMAL HIGH (ref 70–99)

## 2014-03-16 LAB — GLUCOSE, CAPILLARY
GLUCOSE-CAPILLARY: 184 mg/dL — AB (ref 70–99)
GLUCOSE-CAPILLARY: 165 mg/dL — AB (ref 70–99)
Glucose-Capillary: 138 mg/dL — ABNORMAL HIGH (ref 70–99)
Glucose-Capillary: 112 mg/dL — ABNORMAL HIGH (ref 70–99)
Glucose-Capillary: 181 mg/dL — ABNORMAL HIGH (ref 70–99)

## 2014-03-17 LAB — GLUCOSE, CAPILLARY
GLUCOSE-CAPILLARY: 124 mg/dL — AB (ref 70–99)
GLUCOSE-CAPILLARY: 187 mg/dL — AB (ref 70–99)
Glucose-Capillary: 250 mg/dL — ABNORMAL HIGH (ref 70–99)

## 2014-03-18 LAB — GLUCOSE, CAPILLARY
GLUCOSE-CAPILLARY: 241 mg/dL — AB (ref 70–99)
Glucose-Capillary: 153 mg/dL — ABNORMAL HIGH (ref 70–99)
Glucose-Capillary: 289 mg/dL — ABNORMAL HIGH (ref 70–99)
Glucose-Capillary: 239 mg/dL — ABNORMAL HIGH (ref 70–99)

## 2014-03-19 LAB — GLUCOSE, CAPILLARY
GLUCOSE-CAPILLARY: 96 mg/dL (ref 70–99)
GLUCOSE-CAPILLARY: 163 mg/dL — AB (ref 70–99)
GLUCOSE-CAPILLARY: 323 mg/dL — AB (ref 70–99)

## 2014-03-20 LAB — GLUCOSE, CAPILLARY
Glucose-Capillary: 100 mg/dL — ABNORMAL HIGH (ref 70–99)
Glucose-Capillary: 227 mg/dL — ABNORMAL HIGH (ref 70–99)
Glucose-Capillary: 272 mg/dL — ABNORMAL HIGH (ref 70–99)

## 2014-03-21 LAB — GLUCOSE, CAPILLARY
GLUCOSE-CAPILLARY: 219 mg/dL — AB (ref 70–99)
Glucose-Capillary: 115 mg/dL — ABNORMAL HIGH (ref 70–99)
Glucose-Capillary: 155 mg/dL — ABNORMAL HIGH (ref 70–99)
Glucose-Capillary: 172 mg/dL — ABNORMAL HIGH (ref 70–99)

## 2014-03-22 LAB — GLUCOSE, CAPILLARY
GLUCOSE-CAPILLARY: 149 mg/dL — AB (ref 70–99)
GLUCOSE-CAPILLARY: 206 mg/dL — AB (ref 70–99)
Glucose-Capillary: 230 mg/dL — ABNORMAL HIGH (ref 70–99)
Glucose-Capillary: 192 mg/dL — ABNORMAL HIGH (ref 70–99)

## 2014-03-23 LAB — GLUCOSE, CAPILLARY
GLUCOSE-CAPILLARY: 142 mg/dL — AB (ref 70–99)
GLUCOSE-CAPILLARY: 200 mg/dL — AB (ref 70–99)
Glucose-Capillary: 176 mg/dL — ABNORMAL HIGH (ref 70–99)
Glucose-Capillary: 236 mg/dL — ABNORMAL HIGH (ref 70–99)

## 2014-03-24 LAB — GLUCOSE, CAPILLARY
GLUCOSE-CAPILLARY: 84 mg/dL (ref 70–99)
GLUCOSE-CAPILLARY: 189 mg/dL — AB (ref 70–99)
Glucose-Capillary: 141 mg/dL — ABNORMAL HIGH (ref 70–99)
Glucose-Capillary: 178 mg/dL — ABNORMAL HIGH (ref 70–99)

## 2014-03-25 LAB — GLUCOSE, CAPILLARY
GLUCOSE-CAPILLARY: 273 mg/dL — AB (ref 70–99)
Glucose-Capillary: 113 mg/dL — ABNORMAL HIGH (ref 70–99)
Glucose-Capillary: 206 mg/dL — ABNORMAL HIGH (ref 70–99)
Glucose-Capillary: 227 mg/dL — ABNORMAL HIGH (ref 70–99)

## 2014-03-26 LAB — GLUCOSE, CAPILLARY
GLUCOSE-CAPILLARY: 92 mg/dL (ref 70–99)
GLUCOSE-CAPILLARY: 220 mg/dL — AB (ref 70–99)
GLUCOSE-CAPILLARY: 216 mg/dL — AB (ref 70–99)
Glucose-Capillary: 120 mg/dL — ABNORMAL HIGH (ref 70–99)

## 2014-03-27 LAB — GLUCOSE, CAPILLARY
GLUCOSE-CAPILLARY: 158 mg/dL — AB (ref 70–99)
Glucose-Capillary: 237 mg/dL — ABNORMAL HIGH (ref 70–99)
Glucose-Capillary: 224 mg/dL — ABNORMAL HIGH (ref 70–99)
Glucose-Capillary: 229 mg/dL — ABNORMAL HIGH (ref 70–99)

## 2014-03-28 ENCOUNTER — Non-Acute Institutional Stay (SKILLED_NURSING_FACILITY): Payer: Medicare HMO | Admitting: Internal Medicine

## 2014-03-28 DIAGNOSIS — T8189XD Other complications of procedures, not elsewhere classified, subsequent encounter: Secondary | ICD-10-CM

## 2014-03-28 DIAGNOSIS — Z5189 Encounter for other specified aftercare: Secondary | ICD-10-CM

## 2014-03-28 DIAGNOSIS — T8189XA Other complications of procedures, not elsewhere classified, initial encounter: Secondary | ICD-10-CM

## 2014-03-28 LAB — GLUCOSE, CAPILLARY
GLUCOSE-CAPILLARY: 155 mg/dL — AB (ref 70–99)
Glucose-Capillary: 214 mg/dL — ABNORMAL HIGH (ref 70–99)
Glucose-Capillary: 145 mg/dL — ABNORMAL HIGH (ref 70–99)

## 2014-03-29 LAB — GLUCOSE, CAPILLARY: GLUCOSE-CAPILLARY: 89 mg/dL (ref 70–99)

## 2014-03-30 LAB — GLUCOSE, CAPILLARY
GLUCOSE-CAPILLARY: 235 mg/dL — AB (ref 70–99)
Glucose-Capillary: 182 mg/dL — ABNORMAL HIGH (ref 70–99)
Glucose-Capillary: 203 mg/dL — ABNORMAL HIGH (ref 70–99)

## 2014-03-30 NOTE — Progress Notes (Addendum)
Patient ID: [name], female   DOB: 18-Sep-1940, 73 y.o.   MRN: [id number]               PROGRESS NOTE  DATE:  03/28/2014    FACILITY: [facility]    LEVEL OF CARE:   SNF   Acute Visit   CHIEF COMPLAINT:  Review of right first toe surgical wound.    HISTORY OF PRESENT ILLNESS:  This patient initially came to [place] after suffering a fall at home with right hip, right distal fibula, and right first metatarsal joint fractures.  All of these underwent surgical closure.    She recently had the sutures removed on the right great toe.  The wound has dehisced.  There were instructions to consider a Wound Care referral.  I am seeing her today because of this.    PHYSICAL EXAMINATION:   SKIN:  INSPECTION:  Right foot:  Indeed, in the middle of the surgical incision is a linear area that has a depth of about 0.5 cm.   There is adherent surface slough here, which I manually did some debridement on.  There is also a smaller area, probably 3/4 inch, above this major area, which was debrided.    ASSESSMENT/PLAN:  Surgical wound, nonhealing.  Underwent non-surgical debridement today to remove surface escar.  Dressing of choice is Santyl until the base of these clean up and then a collagen/hydrogel based dressing.  She is already ambulating.  If she leaves here before this is resolved (which is entirely likely), she will likely need a Wound Care referral.

## 2014-03-31 ENCOUNTER — Other Ambulatory Visit: Payer: Self-pay | Admitting: *Deleted

## 2014-03-31 ENCOUNTER — Ambulatory Visit (HOSPITAL_COMMUNITY): Payer: Medicare HMO | Attending: Internal Medicine | Admitting: Physical Therapy

## 2014-03-31 LAB — GLUCOSE, CAPILLARY
GLUCOSE-CAPILLARY: 235 mg/dL — AB (ref 70–99)
Glucose-Capillary: 52 mg/dL — ABNORMAL LOW (ref 70–99)
Glucose-Capillary: 175 mg/dL — ABNORMAL HIGH (ref 70–99)

## 2014-03-31 MED ORDER — HYDROCODONE-ACETAMINOPHEN 5-325 MG PO TABS: ORAL_TABLET | ORAL | Status: DC

## 2014-03-31 NOTE — Telephone Encounter (Signed)
[facility] 

## 2014-04-01 ENCOUNTER — Ambulatory Visit (HOSPITAL_COMMUNITY): Payer: Medicare HMO

## 2014-04-01 ENCOUNTER — Non-Acute Institutional Stay (SKILLED_NURSING_FACILITY): Payer: Medicare HMO | Admitting: Internal Medicine

## 2014-04-01 ENCOUNTER — Ambulatory Visit (HOSPITAL_COMMUNITY)
Admit: 2014-04-01 | Discharge: 2014-04-01 | Disposition: A | Discharge status: Home / Self Care | Payer: Medicare HMO | Source: Ambulatory Visit | Attending: Internal Medicine | Admitting: Internal Medicine

## 2014-04-01 DIAGNOSIS — M7989 Other specified soft tissue disorders: Secondary | ICD-10-CM | POA: Insufficient documentation

## 2014-04-01 DIAGNOSIS — Z9889 Other specified postprocedural states: Secondary | ICD-10-CM | POA: Diagnosis not present

## 2014-04-01 DIAGNOSIS — I5032 Chronic diastolic (congestive) heart failure: Secondary | ICD-10-CM

## 2014-04-01 DIAGNOSIS — I509 Heart failure, unspecified: Secondary | ICD-10-CM

## 2014-04-01 DIAGNOSIS — R609 Edema, unspecified: Secondary | ICD-10-CM

## 2014-04-01 LAB — GLUCOSE, CAPILLARY
GLUCOSE-CAPILLARY: 226 mg/dL — AB (ref 70–99)
Glucose-Capillary: 98 mg/dL (ref 70–99)

## 2014-04-01 NOTE — Progress Notes (Signed)
Patient ID: [name], female   DOB: 02-26-1941, 73 y.o.   MRN: [id number]   This is an acute visit.  Level of care skilled.  Facility [facility].  Chief complaint acute visit secondary to lower extremity edema.  History of present illness.  Patient is a pleasant 73 year old female with a complex medical history including history of recent right hip distal fibula and right first metatarsal joint fractures-this is followed by orthopedics she appears to be doing relatively well with this-.  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  She does not complaining of any increased shortness of breath from baseline or chest pain or cough.  Family medical social history as been reviewed per admission note on 02/14/2014.  Medications have been reviewed per MAR.  Review of systems.  General no complaints of fever or chills.  Respiratory does not complaining of increased shortness of breath from baseline.  Cardiac does not complain of chest pain.  GI is not complaining of abdominal discomfort nausea or vomiting.  Muscle skeletal does complain at times --more so the left leg today Narcotics have gradually been titrated up again there is some concern for increased confusion with aggressive narcotic treatments we've been somewhat conservative here  Neurologic does have a history of neuropathy and Neurontin was recently increased by our service she does not really specifically complain of that today although she does have some discomfort in her lower legs as noted previously.  Psych does not complaining of depression or anxiety today.  Physical exam.  Temperature is 98.4 pulse 82 respirations 18 blood pressures are 139/54 her weight is 219.  In general is a pleasant elderly resident no distress sitting  comfortably in her wheelchair.  Her skin is warm and dry.  Chest is clear to auscultation there is no labored breathing.  Heart is regular rate and rhythm with a was a slight systolic murmur-she has baseline it appears lower extremity edema --has a boot applied to her right lower extremity I would estimate anemia 2+.  Abdomen is obese soft nontender positive bowel sounds.  Neurologic is grossly intact she does have lower extremity weakness which is baseline speech is clear-no lateralizing findings cranial nerves grossly intact.  Psych she is alert and oriented pleasant and appropriate.  Labs.  02/23/2014.  WBC 8.4 hemoglobin 11.6 platelets 280.  Sodium 140 potassium 4 BUN 17 creatinine 0.81 CO2 33-liver function tests within normal limits except albumin of 2.7.  Assessment and plan.  #1-lower extremity edema-this appears relatively baseline however patient remains concerned has some rather vague complaints of pain here-especially left leg-obtain venous Dopplers bilaterally to rule out a DVT.  Also will obtain a chest x-ray.  Monitor vital signs pulse ox every shift for 72 hours notify provider if increased cough congestion.  Also will update lab work including a CBC and metabolic panel tomorrow  2 diastolic CHF-actually she appears to be gradually losing a small amount weight-at this point continue to monitor-again will await chest x-ray to rule out any pulmonary edema.  [id number]

## 2014-04-03 ENCOUNTER — Encounter: Payer: Self-pay | Admitting: Internal Medicine

## 2014-04-03 DIAGNOSIS — R609 Edema, unspecified: Secondary | ICD-10-CM | POA: Insufficient documentation

## 2014-04-04 LAB — GLUCOSE, CAPILLARY
GLUCOSE-CAPILLARY: 171 mg/dL — AB (ref 70–99)
GLUCOSE-CAPILLARY: 73 mg/dL (ref 70–99)
GLUCOSE-CAPILLARY: 41 mg/dL — AB (ref 70–99)
GLUCOSE-CAPILLARY: 57 mg/dL — AB (ref 70–99)
GLUCOSE-CAPILLARY: 250 mg/dL — AB (ref 70–99)
Glucose-Capillary: 170 mg/dL — ABNORMAL HIGH (ref 70–99)
Glucose-Capillary: 177 mg/dL — ABNORMAL HIGH (ref 70–99)
Glucose-Capillary: 76 mg/dL (ref 70–99)
Glucose-Capillary: 232 mg/dL — ABNORMAL HIGH (ref 70–99)
Glucose-Capillary: 194 mg/dL — ABNORMAL HIGH (ref 70–99)
Glucose-Capillary: 72 mg/dL (ref 70–99)
Glucose-Capillary: 247 mg/dL — ABNORMAL HIGH (ref 70–99)

## 2014-04-05 LAB — GLUCOSE, CAPILLARY
Glucose-Capillary: 138 mg/dL — ABNORMAL HIGH (ref 70–99)
Glucose-Capillary: 199 mg/dL — ABNORMAL HIGH (ref 70–99)

## 2014-04-06 LAB — GLUCOSE, CAPILLARY
Glucose-Capillary: 240 mg/dL — ABNORMAL HIGH (ref 70–99)
Glucose-Capillary: 83 mg/dL (ref 70–99)
Glucose-Capillary: 147 mg/dL — ABNORMAL HIGH (ref 70–99)
Glucose-Capillary: 209 mg/dL — ABNORMAL HIGH (ref 70–99)
Glucose-Capillary: 172 mg/dL — ABNORMAL HIGH (ref 70–99)

## 2014-04-07 LAB — GLUCOSE, CAPILLARY
GLUCOSE-CAPILLARY: 87 mg/dL (ref 70–99)
Glucose-Capillary: 304 mg/dL — ABNORMAL HIGH (ref 70–99)

## 2014-04-08 LAB — GLUCOSE, CAPILLARY
Glucose-Capillary: 64 mg/dL — ABNORMAL LOW (ref 70–99)
Glucose-Capillary: 325 mg/dL — ABNORMAL HIGH (ref 70–99)
Glucose-Capillary: 214 mg/dL — ABNORMAL HIGH (ref 70–99)

## 2014-04-09 LAB — GLUCOSE, CAPILLARY
GLUCOSE-CAPILLARY: 206 mg/dL — AB (ref 70–99)
GLUCOSE-CAPILLARY: 206 mg/dL — AB (ref 70–99)
Glucose-Capillary: 142 mg/dL — ABNORMAL HIGH (ref 70–99)
Glucose-Capillary: 69 mg/dL — ABNORMAL LOW (ref 70–99)
Glucose-Capillary: 93 mg/dL (ref 70–99)

## 2014-04-10 ENCOUNTER — Inpatient Hospital Stay (HOSPITAL_COMMUNITY)
Admit: 2014-04-10 | Discharge: 2014-04-10 | Disposition: A | Discharge status: Home / Self Care | Payer: Medicare HMO | Attending: Internal Medicine | Admitting: Internal Medicine

## 2014-04-10 LAB — GLUCOSE, CAPILLARY
Glucose-Capillary: 134 mg/dL — ABNORMAL HIGH (ref 70–99)
Glucose-Capillary: 197 mg/dL — ABNORMAL HIGH (ref 70–99)
Glucose-Capillary: 158 mg/dL — ABNORMAL HIGH (ref 70–99)

## 2014-04-11 ENCOUNTER — Encounter: Payer: Self-pay | Admitting: Internal Medicine

## 2014-04-11 NOTE — Progress Notes (Signed)
This encounter was created in error - please disregard.

## 2014-04-12 LAB — GLUCOSE, CAPILLARY
GLUCOSE-CAPILLARY: 153 mg/dL — AB (ref 70–99)
GLUCOSE-CAPILLARY: 201 mg/dL — AB (ref 70–99)
GLUCOSE-CAPILLARY: 123 mg/dL — AB (ref 70–99)
Glucose-Capillary: 100 mg/dL — ABNORMAL HIGH (ref 70–99)
Glucose-Capillary: 184 mg/dL — ABNORMAL HIGH (ref 70–99)
Glucose-Capillary: 175 mg/dL — ABNORMAL HIGH (ref 70–99)
Glucose-Capillary: 226 mg/dL — ABNORMAL HIGH (ref 70–99)
Glucose-Capillary: 215 mg/dL — ABNORMAL HIGH (ref 70–99)

## 2014-04-13 ENCOUNTER — Non-Acute Institutional Stay (SKILLED_NURSING_FACILITY): Payer: Medicare HMO | Admitting: Internal Medicine

## 2014-04-13 DIAGNOSIS — Q230 Congenital stenosis of aortic valve: Secondary | ICD-10-CM

## 2014-04-13 DIAGNOSIS — T8189XD Other complications of procedures, not elsewhere classified, subsequent encounter: Secondary | ICD-10-CM

## 2014-04-13 DIAGNOSIS — T8189XA Other complications of procedures, not elsewhere classified, initial encounter: Secondary | ICD-10-CM

## 2014-04-13 DIAGNOSIS — I509 Heart failure, unspecified: Secondary | ICD-10-CM

## 2014-04-13 DIAGNOSIS — I5033 Acute on chronic diastolic (congestive) heart failure: Secondary | ICD-10-CM

## 2014-04-13 DIAGNOSIS — Z5189 Encounter for other specified aftercare: Secondary | ICD-10-CM

## 2014-04-13 DIAGNOSIS — I359 Nonrheumatic aortic valve disorder, unspecified: Secondary | ICD-10-CM

## 2014-04-13 DIAGNOSIS — Q231 Congenital insufficiency of aortic valve: Secondary | ICD-10-CM

## 2014-04-13 LAB — GLUCOSE, CAPILLARY
Glucose-Capillary: 121 mg/dL — ABNORMAL HIGH (ref 70–99)
Glucose-Capillary: 193 mg/dL — ABNORMAL HIGH (ref 70–99)
Glucose-Capillary: 219 mg/dL — ABNORMAL HIGH (ref 70–99)

## 2014-04-13 NOTE — Progress Notes (Signed)
Patient ID: [name], female   DOB: Feb 23, 1941, 73 y.o.   MRN: [id number] Facility; [facility] Chief complaint; congestive heart failure History; this is a patient who came to [place] after a hip fracture, nondisplaced ankle fracture as well as a fracture of her right this first metatarsal joint. She underwent surgery for all of this and she from this point of view she is done well however still has an open surgical wound in the right first toe  She was seen last week by our service noted to be increasing amount of edema. A chest x-ray suggested congestive heart failure. Her Lasix was increased from 20-40 mg. Weights have not really changed in the 222 2-23 range. He still complains of shortness of breath requiring oxygen and even some orthopnea   ------------------------------------------------------------------- Left ventricle:   Wall thickness was increased in a pattern of moderate to severe LVH.   Systolic function was vigorous. The estimated ejection fraction was in the range of 65% to 70%. Wall motion was normal; there were no regional wall motion abnormalities. Doppler parameters are consistent with abnormal left ventricular relaxation (grade 1 diastolic dysfunction). Doppler parameters are consistent with elevated ventricular end-diastolic filling pressure.  ------------------------------------------------------------------- Aortic valve:  Mildly to moderately calcified annulus. Functionally bicuspid; moderately calcified leaflets. Appears to be partial fusion of the left and noncoronary cusps. LVOT/AV VTI ratio 0.43. Doppler:   There was moderate stenosis.   There was no significant regurgitation.    Valve area (VTI): 1.24 cm^2. Indexed valve area (VTI): 0.6 cm^2/m^2. Valve area (Vmax): 1.12 cm^2. Indexed valve area (Vmax): 0.54 cm^2/m^2.    Mean gradient (S): 15 mm Hg. Peak gradient (S): 31 mm Hg.  ------------------------------------------------------------------- Aorta:  Aortic  root: The aortic root was normal in size.  ------------------------------------------------------------------- Mitral valve:   Calcified annulus.  Doppler:  There was trivial regurgitation.    Peak gradient (D): 3 mm Hg.  ------------------------------------------------------------------- Left atrium:  The atrium was severely dilated.  ------------------------------------------------------------------- Atrial septum:  No defect or patent foramen ovale was identified.   ------------------------------------------------------------------- Right ventricle:  The cavity size was normal. Systolic function was normal.  ------------------------------------------------------------------- Pulmonic valve:   Poorly visualized.  The valve appears to be grossly normal.    Doppler:  There was physiologic regurgitation.   ------------------------------------------------------------------- Tricuspid valve:   The valve appears to be grossly normal. Doppler:  There was trivial regurgitation.  ------------------------------------------------------------------- Pulmonary artery:    Systolic pressure could not be accurately estimated.  ------------------------------------------------------------------- Right atrium:  The atrium was normal in size.  ------------------------------------------------------------------- Pericardium:  A prominent pericardial fat pad was present. There was no pericardial effusion.  ------------------------------------------------------------------- Systemic veins: Inferior vena cava: The vessel was normal in size. The respirophasic diameter changes were in the normal range (>= 50%), consistent with normal central venous pressure.  ------------------------------------------------------------------- Prepared and Electronically Authenticated by  Rozann Lesches, M.D. 2015-07-03T09:57:11   Current medication Atorvastatin 20 daily Carvedilol 3.125 one tablet twice a  day Cetirizine 10 mg daily Colace 100 twice daily Fluticasone 1 spray nasal Hydralazine 25 mg 4 times a day Lessard and 100 mg daily Mobic 15 mg daily Omeprazole 20 mg daily Levemir 35 units twice a day NovoLog sliding scale 5 units only if CBG greater than 200 Neurontin 303 times a day ASA 325  Lab work from 9/5 Sodium 143, potassium 4.6 BUN 14 creatinine 0.7  Review of systems Gen. patient complains of widespread edema Respiratory short of breath requiring oxygen Cardiac no exertional chest pain GI  states her abdomen is swollen Extremities both legs are swollen  Physical examination Gen. very anxious woman but not in any overt distress Vitals O2 sat 96% on 2 L pulse 74 respirations 18 and unlabored Respiratory bibasilar crackles worse on the left side there may be a small left-sided pleural effusion Cardiac grade 2/6 midsystolic murmur there is no S4 no S3 JVP is elevated to the angle of the jaw at 45 Abdomen distended but no liver no spleen no masses GU no obvious bladder distention Extremities there is minimal edema here. 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 Previous evaluation here is shown moderate aortic stenosis with moderate to severe LVH. She is going to need more diuretic #2 surgical wound nonhealing in the right great toe see if this can I can divide this further today.

## 2014-04-14 LAB — GLUCOSE, CAPILLARY
GLUCOSE-CAPILLARY: 71 mg/dL (ref 70–99)
Glucose-Capillary: 232 mg/dL — ABNORMAL HIGH (ref 70–99)
Glucose-Capillary: 180 mg/dL — ABNORMAL HIGH (ref 70–99)

## 2014-04-15 ENCOUNTER — Non-Acute Institutional Stay (SKILLED_NURSING_FACILITY): Payer: Medicare HMO | Admitting: Internal Medicine

## 2014-04-15 DIAGNOSIS — I509 Heart failure, unspecified: Secondary | ICD-10-CM

## 2014-04-15 DIAGNOSIS — I5031 Acute diastolic (congestive) heart failure: Secondary | ICD-10-CM

## 2014-04-15 DIAGNOSIS — R195 Other fecal abnormalities: Secondary | ICD-10-CM

## 2014-04-15 LAB — GLUCOSE, CAPILLARY
GLUCOSE-CAPILLARY: 107 mg/dL — AB (ref 70–99)
GLUCOSE-CAPILLARY: 143 mg/dL — AB (ref 70–99)
Glucose-Capillary: 247 mg/dL — ABNORMAL HIGH (ref 70–99)
Glucose-Capillary: 125 mg/dL — ABNORMAL HIGH (ref 70–99)

## 2014-04-15 NOTE — Progress Notes (Signed)
Patient ID: [name], female   DOB: 20-Jun-1941, 73 y.o.   MRN: [id number]   This is an acute visit.  Level of care skilled.  Facility [facility] .  Chief complaint acute visit secondary to occult positive stool test .   History of present illness.  Patient is a pleasant 73 year old female with a complex medical history including history of recent right hip distal fibula and right first metatarsal joint fractures-this is followed by orthopedics she appears to be doing relatively well with this-.   Dr. Dellia Nims saw earlier this week and increased her Lasix secondary to concerns of CHF which is biventricular-her prorates appear to be stable to slightly down from her previous levels  Nursing staff did test her stool for occult blood recently secondary to thoughts that she had some slight blood in the bowel movement-this has come back positive.  She does not complaining of any increased shortness of breath chest pain or weakness from baseline--she is on a proton pump inhibitor .  Family medical social history as been reviewed per admission note on 02/14/2014.   Medications have been reviewed per MAR--she is on a proton pump inhibitor also had been on Lovenox now on aspirin for DVT prophylaxis-- .  Review of systems.  General no complaints of fever or chills.  Respiratory does not complaining of increased shortness of breath from baseline.  Cardiac does not complain of chest pain.  GI is not complaining of abdominal discomfort nausea or vomiting.  Muscle skeletal does complain at times -- Narcotics have gradually been titrated up again there is some concern for increased confusion with aggressive narcotic treatments we've been somewhat conservative here  Neurologic does have a history of neuropathy and Neurontin was recently increased by our service she does not really specifically complain of that today although she does have some discomfort in her lower legs as noted previously.  Psych does not  complaining of depression or anxiety today .  Physical exam.  Temperature 98.2-pulse 84-respirations 20-blood pressure 140/54-weight 217.9.  In general is a pleasant elderly resident no distress sitting comfortably in her wheelchair.  Her skin is warm and dry.  Chest is clear to auscultation there is no labored breathing.  Heart is regular rate and rhythm with a was Z1/6  systolic murmur-she has baseline it appears lower extremity edema -- I would estimate edema  1- 2+.  Abdomen is obese soft nontender positive bowel sounds.  Neurologic is grossly intact she does have lower extremity weakness which is baseline speech is clear-no lateralizing findings cranial nerves grossly intact.  Psych she is alert and oriented pleasant and appropriate.   Labs  04/09/2014.  Sodium 143-potassium 4.6-BUN 14 creatinine 0.74  04/02/2014.  WBC 9.1-hemoglobin 11.4-platelets 193.  Sodium 139 potassium 5.3 BUN 19 creatinine 0.8.  [name]  .  02/23/2014.  WBC 8.4 hemoglobin 11.6 platelets 280.  Sodium 140 potassium 4 BUN 17 creatinine 0.81 CO2 33-liver function tests within normal limits except albumin of 2.7.   Assessment and plan  #1-history of occult positive stool x1-clinically patient appears stable I did discuss this with Dr. Dellia Nims via phone-will order a GI consult most likely to be pursued after her discharge-also will update her lab work including a CBC a metabolic panel to ensure stability-continue to guaiac stools --and continue proton pump inhibitor.   #2- History of biventricular CHF-Dr. often recently increased her Lasix weight appears to be stable clinically she appears to be stable will need an updated basic metabolic panel   .  [id number]  Of note-30 minutes been assessing patient-reviewing her medical records-discussing her status with nursing staff-and coordinating a plan of care-of note greater than 50% of time spent coordinating plan of care

## 2014-04-16 LAB — GLUCOSE, CAPILLARY
GLUCOSE-CAPILLARY: 248 mg/dL — AB (ref 70–99)
Glucose-Capillary: 70 mg/dL (ref 70–99)
Glucose-Capillary: 186 mg/dL — ABNORMAL HIGH (ref 70–99)

## 2014-04-17 ENCOUNTER — Encounter: Payer: Self-pay | Admitting: Internal Medicine

## 2014-04-17 DIAGNOSIS — R195 Other fecal abnormalities: Secondary | ICD-10-CM | POA: Insufficient documentation

## 2014-04-17 LAB — GLUCOSE, CAPILLARY
Glucose-Capillary: 65 mg/dL — ABNORMAL LOW (ref 70–99)
Glucose-Capillary: 190 mg/dL — ABNORMAL HIGH (ref 70–99)
Glucose-Capillary: 222 mg/dL — ABNORMAL HIGH (ref 70–99)

## 2014-04-18 ENCOUNTER — Telehealth: Payer: Self-pay

## 2014-04-18 ENCOUNTER — Non-Acute Institutional Stay (SKILLED_NURSING_FACILITY): Payer: Medicare HMO | Admitting: Internal Medicine

## 2014-04-18 DIAGNOSIS — I5033 Acute on chronic diastolic (congestive) heart failure: Secondary | ICD-10-CM

## 2014-04-18 DIAGNOSIS — I509 Heart failure, unspecified: Secondary | ICD-10-CM

## 2014-04-18 LAB — GLUCOSE, CAPILLARY
GLUCOSE-CAPILLARY: 160 mg/dL — AB (ref 70–99)
Glucose-Capillary: 125 mg/dL — ABNORMAL HIGH (ref 70–99)
Glucose-Capillary: 113 mg/dL — ABNORMAL HIGH (ref 70–99)

## 2014-04-18 NOTE — Telephone Encounter (Signed)
Labs received and placed on [name]'s desk.

## 2014-04-18 NOTE — Telephone Encounter (Signed)
T/C from [facility] requesting an urgent office visit for a pt we have not seen before. They would like her to have GI consult for blood in stool. She had a positive hemoccult. She is not having any N/V. No constipation. BM's normal. Please advise when we can see this pt. Fernanda Drum at [phone])

## 2014-04-18 NOTE — Telephone Encounter (Signed)
Let's get copy of labs they did last week, ie CBC.

## 2014-04-18 NOTE — Telephone Encounter (Signed)
[facility] and she will fax a copy of the labs over.

## 2014-04-19 LAB — GLUCOSE, CAPILLARY
Glucose-Capillary: 54 mg/dL — ABNORMAL LOW (ref 70–99)
Glucose-Capillary: 175 mg/dL — ABNORMAL HIGH (ref 70–99)
Glucose-Capillary: 179 mg/dL — ABNORMAL HIGH (ref 70–99)

## 2014-04-19 NOTE — Telephone Encounter (Signed)
Pt is scheduled for urgent OV with Neil Crouch, PA on 04/21/2014 at 11:30 AM. [name] is aware and pt's husband will be able to accompany her to the appt.  Pt could not come tomorrow because she has two appts in [place].

## 2014-04-19 NOTE — Telephone Encounter (Signed)
Hgb stable 10.9 per records. No overt GI bleeding. Does not appear to be urgent issue but okay to put in urgent spot since it is being requested.

## 2014-04-20 LAB — GLUCOSE, CAPILLARY
GLUCOSE-CAPILLARY: 58 mg/dL — AB (ref 70–99)
GLUCOSE-CAPILLARY: 58 mg/dL — AB (ref 70–99)
GLUCOSE-CAPILLARY: 234 mg/dL — AB (ref 70–99)
GLUCOSE-CAPILLARY: 240 mg/dL — AB (ref 70–99)
Glucose-Capillary: 49 mg/dL — ABNORMAL LOW (ref 70–99)
Glucose-Capillary: 75 mg/dL (ref 70–99)
Glucose-Capillary: 184 mg/dL — ABNORMAL HIGH (ref 70–99)

## 2014-04-21 ENCOUNTER — Ambulatory Visit: Payer: Medicare HMO | Admitting: Gastroenterology

## 2014-04-21 LAB — GLUCOSE, CAPILLARY
Glucose-Capillary: 155 mg/dL — ABNORMAL HIGH (ref 70–99)
Glucose-Capillary: 178 mg/dL — ABNORMAL HIGH (ref 70–99)

## 2014-04-22 ENCOUNTER — Ambulatory Visit (HOSPITAL_COMMUNITY): Payer: Medicare HMO | Attending: Internal Medicine

## 2014-04-22 DIAGNOSIS — M869 Osteomyelitis, unspecified: Secondary | ICD-10-CM | POA: Insufficient documentation

## 2014-04-22 LAB — GLUCOSE, CAPILLARY
Glucose-Capillary: 83 mg/dL (ref 70–99)
Glucose-Capillary: 214 mg/dL — ABNORMAL HIGH (ref 70–99)

## 2014-04-23 LAB — GLUCOSE, CAPILLARY
GLUCOSE-CAPILLARY: 253 mg/dL — AB (ref 70–99)
GLUCOSE-CAPILLARY: 235 mg/dL — AB (ref 70–99)
Glucose-Capillary: 172 mg/dL — ABNORMAL HIGH (ref 70–99)
Glucose-Capillary: 306 mg/dL — ABNORMAL HIGH (ref 70–99)

## 2014-04-24 LAB — GLUCOSE, CAPILLARY
GLUCOSE-CAPILLARY: 86 mg/dL (ref 70–99)
Glucose-Capillary: 221 mg/dL — ABNORMAL HIGH (ref 70–99)
Glucose-Capillary: 194 mg/dL — ABNORMAL HIGH (ref 70–99)
Glucose-Capillary: 162 mg/dL — ABNORMAL HIGH (ref 70–99)

## 2014-04-25 LAB — GLUCOSE, CAPILLARY
GLUCOSE-CAPILLARY: 102 mg/dL — AB (ref 70–99)
Glucose-Capillary: 220 mg/dL — ABNORMAL HIGH (ref 70–99)
Glucose-Capillary: 172 mg/dL — ABNORMAL HIGH (ref 70–99)
Glucose-Capillary: 192 mg/dL — ABNORMAL HIGH (ref 70–99)

## 2014-04-25 NOTE — Progress Notes (Addendum)
Patient ID: [name], female   DOB: 21-Oct-1940, 73 y.o.   MRN: [id number]               PROGRESS NOTE  DATE:  04/18/2014    FACILITY: [facility]    LEVEL OF CARE:   SNF   Acute Visit   CHIEF COMPLAINT:  Follow up CHF.    HISTORY OF PRESENT ILLNESS:  This is a patient who came to [place] after a hip fracture, a nondisplaced ankle fracture, as well as fracture of her right first metatarsophalangeal joint.  She underwent surgery for all of these.  She still has an open surgical area in the right great toe.    Last week, she was seen by our service due to increasing amounts of edema.  A chest x-ray suggested congestive heart failure.  When I saw her last on 04/13/2014, I increased her Lasix from 40 to 80 mg.  Previous echocardiogram had suggested moderate to severe LVH as well as moderate aortic stenosis.  She had clear signs of biventricular heart failure on exam.    REVIEW OF SYSTEMS:   CHEST/RESPIRATORY:  The patient still feels short of breath.   CARDIAC:   No chest pain.   GU:  No dysuria.    PHYSICAL EXAMINATION:   O2 SATURATIONS:  91% on room air (was off her oxygen).   PULSE:  72.   RESPIRATIONS:  18 and unlabored.    GENERAL APPEARANCE:  The patient is not in any distress.   CHEST/RESPIRATORY:  No crackles are appreciated.  This is an improvement from the other day.   CARDIOVASCULAR:  CARDIAC:  She has a grade 5-8/8 midsystolic ejection murmur, maximal at the aortic area.  No gallops.  JVP is not elevated.   EDEMA/VARICOSITIES:  No edema is seen.   SKIN:  INSPECTION:  Wound, right great toe, continues to have a sloughy surface.  This is going to need to be cultured.    ASSESSMENT/PLAN:  Congestive heart failure.  This seems better at the bedside, although her weights have not really changed that much, from 223.7 to 220.  I am going to continue her on the same dose of Lasix.  Lab work was ordered, although I do not see these results.      Surgical wound to the  right great toe.  This is going to need to be cultured.  Currently, we are using Santyl.

## 2014-04-26 LAB — GLUCOSE, CAPILLARY
GLUCOSE-CAPILLARY: 182 mg/dL — AB (ref 70–99)
GLUCOSE-CAPILLARY: 279 mg/dL — AB (ref 70–99)
Glucose-Capillary: 153 mg/dL — ABNORMAL HIGH (ref 70–99)
Glucose-Capillary: 290 mg/dL — ABNORMAL HIGH (ref 70–99)

## 2014-04-27 ENCOUNTER — Encounter: Payer: Self-pay | Admitting: Gastroenterology

## 2014-04-27 ENCOUNTER — Ambulatory Visit (INDEPENDENT_AMBULATORY_CARE_PROVIDER_SITE_OTHER): Payer: Medicare HMO | Admitting: Gastroenterology

## 2014-04-27 ENCOUNTER — Non-Acute Institutional Stay (SKILLED_NURSING_FACILITY): Payer: Medicare HMO | Admitting: Internal Medicine

## 2014-04-27 ENCOUNTER — Other Ambulatory Visit: Payer: Self-pay | Admitting: *Deleted

## 2014-04-27 VITALS — BP 168/65 | HR 88 | Temp 99.1°F

## 2014-04-27 DIAGNOSIS — Q230 Congenital stenosis of aortic valve: Secondary | ICD-10-CM

## 2014-04-27 DIAGNOSIS — T8189XA Other complications of procedures, not elsewhere classified, initial encounter: Secondary | ICD-10-CM

## 2014-04-27 DIAGNOSIS — I359 Nonrheumatic aortic valve disorder, unspecified: Secondary | ICD-10-CM

## 2014-04-27 DIAGNOSIS — Z5189 Encounter for other specified aftercare: Secondary | ICD-10-CM

## 2014-04-27 DIAGNOSIS — R195 Other fecal abnormalities: Secondary | ICD-10-CM

## 2014-04-27 DIAGNOSIS — T8189XD Other complications of procedures, not elsewhere classified, subsequent encounter: Secondary | ICD-10-CM

## 2014-04-27 DIAGNOSIS — Q231 Congenital insufficiency of aortic valve: Secondary | ICD-10-CM

## 2014-04-27 DIAGNOSIS — I509 Heart failure, unspecified: Secondary | ICD-10-CM

## 2014-04-27 DIAGNOSIS — D638 Anemia in other chronic diseases classified elsewhere: Secondary | ICD-10-CM

## 2014-04-27 DIAGNOSIS — I5033 Acute on chronic diastolic (congestive) heart failure: Secondary | ICD-10-CM

## 2014-04-27 LAB — GLUCOSE, CAPILLARY
GLUCOSE-CAPILLARY: 131 mg/dL — AB (ref 70–99)
Glucose-Capillary: 164 mg/dL — ABNORMAL HIGH (ref 70–99)

## 2014-04-27 MED ORDER — HYDROCODONE-ACETAMINOPHEN 5-325 MG PO TABS: ORAL_TABLET | ORAL | Status: DC

## 2014-04-27 MED ORDER — HYDROCORTISONE ACETATE 25 MG RE SUPP: 25.0000 mg | Freq: Two times a day (BID) | RECTAL | Status: DC

## 2014-04-27 NOTE — Progress Notes (Signed)
Primary Care Physician:  Cyndee Brightly, MD  Primary Gastroenterologist:  Barney Drain, MD   Chief Complaint  Patient presents with  . Blood In Stools    HPI:  [name] is a 73 y.o. female here at the request of Dr. Dellia Nims at the [facility] for further evaluation of anemia, heme + stool. Patient currently resides at the nursing home for rehabilitation post hip/ankle/foot fracture. Hgb 11.4 on 04/02/14 and 10.9 the following week. Back in 02/2014 her Hgb was in the low 11 range.There has been no report of melena or obvious rectal bleeding. Reason for hemoccult testing was because of "possible blood in stool". One BM daily. Strains some. C/o hemorrhoids. Some nausea. H/o GERD. No dysphagia. No abdominal pain. C/o fullness/bloating. No prior EGD/TCS. C/O swelling of extremities.    Current Outpatient Prescriptions on File Prior to Visit  Medication Sig Dispense Refill  . acetaminophen (TYLENOL) 325 MG tablet Take 2 tablets (650 mg total) by mouth every 6 (six) hours as needed for mild pain.      [name] aspirin EC 81 MG tablet Take 325 mg by mouth daily.       [name] atorvastatin (LIPITOR) 20 MG tablet Take 20 mg by mouth at bedtime.      . carvedilol (COREG) 3.125 MG tablet Take 1 tablet (3.125 mg total) by mouth 2 (two) times daily with a meal.      . cetirizine (ZYRTEC) 10 MG tablet Take 10 mg by mouth daily as needed for allergies.      [name] docusate sodium (COLACE) 100 MG capsule Take 100 mg by mouth 2 (two) times daily.      . fluticasone (FLONASE) 50 MCG/ACT nasal spray Place 1 spray into both nostrils daily.      . furosemide (LASIX) 20 MG tablet Take 80 mg by mouth daily.      [name] gabapentin (NEURONTIN) 300 MG capsule Take 300 mg by mouth 2 (two) times daily.      . hydrALAZINE (APRESOLINE) 25 MG tablet Take 1 tablet (25 mg total) by mouth 4 (four) times daily.      . hydrocortisone (ANUSOL-HC) 2.5 % rectal cream Apply 1 application topically 4 (four) times daily as needed.       . insulin  aspart (NOVOLOG) 100 UNIT/ML injection Inject 0-15 Units into the skin 3 (three) times daily with meals. Before each meal 3 times a day, 140-199 - 3 units, 200-250 - 5 units, 251-299 - 8 units,  300-349 - 12 units,  350 or above 15 units.  10 mL  11  . losartan (COZAAR) 100 MG tablet Take 1 tablet (100 mg total) by mouth daily.      [name] omeprazole (PRILOSEC) 20 MG capsule Take 20 mg by mouth daily.      . polyethylene glycol (MIRALAX / GLYCOLAX) packet Take 17 g by mouth 2 (two) times daily.  14 each  0  Levemir 35 units twice a day K-Dur 20 milliequivalents daily Milk of magnesia when necessary  Allergies as of 04/27/2014 - Review Complete 04/27/2014  Allergen Reaction Noted  . Iodine Anaphylaxis 02/03/2014  . Penicillins Anaphylaxis 02/03/2014  . Shellfish allergy Anaphylaxis 02/03/2014  . Sulfa antibiotics Anaphylaxis 02/03/2014    Past Medical History  Diagnosis Date  . HTN (hypertension)   . Diabetes mellitus without complication   . High cholesterol   . Spasm of back muscles   . Aortic stenosis     Past Surgical History  Procedure Laterality  Date  . Intramedullary (im) nail intertrochanteric Left 02/04/2014    Procedure: INTRAMEDULLARY (IM) NAIL INTERTROCHANTRIC FEMORAL;  Surgeon: Mauri Pole, MD;  Location: [place] [facility];  Service: Orthopedics;  Laterality: Left;  . Orif toe fracture Right 02/08/2014    Procedure: OPEN REDUCTION INTERNAL FIXATION Right METATARSAL  FRACTURE ;  Surgeon: Wylene Simmer, MD;  Location: [place];  Service: Orthopedics;  Laterality: Right;    Family History  Problem Relation Age of Onset  . Diabetes Other     History   Social History  . Marital Status: Married    Spouse Name: N/A    Number of Children: N/A  . Years of Education: N/A   Occupational History  . Not on file.   Social History Main Topics  . Smoking status: Never Smoker   . Smokeless tobacco: Never Used     Comment: Never smoked  . Alcohol Use: No  . Drug Use: No  . Sexual Activity:  No   Other Topics Concern  . Not on file   Social History Narrative  . No narrative on file      ROS:  General: Negative for anorexia, weight loss, fever, chills, fatigue, weakness. Eyes: Negative for vision changes.  ENT: Negative for hoarseness, difficulty swallowing , nasal congestion. CV: Negative for chest pain, angina, palpitations, dyspnea on exertion, peripheral edema.  Respiratory: Negative for dyspnea at rest, dyspnea on exertion, cough, sputum, wheezing.  GI: See history of present illness. GU:  Negative for dysuria, hematuria, urinary incontinence, urinary frequency, nocturnal urination.  MS: c/o foot pain, No low back pain.  Derm: Negative for rash or itching.  Neuro: Negative for weakness, abnormal sensation, seizure, frequent headaches, memory loss, confusion.  Psych: Negative for anxiety, depression, suicidal ideation, hallucinations.  Endo: Negative for unusual weight change.  Heme: Negative for bruising or bleeding. Allergy: Negative for rash or hives.    Physical Examination:  BP 168/65  Pulse 88  Temp(Src) 99.1 F (37.3 C) (Oral)   General: Well-nourished, well-developed in no acute distress. Spouse present. Head: Normocephalic, atraumatic.   Eyes: Conjunctiva pink, no icterus. Mouth: Oropharyngeal mucosa moist and pink , no lesions erythema or exudate. Neck: Supple without thyromegaly, masses, or lymphadenopathy.  Lungs: Clear to auscultation bilaterally.  Heart: Regular rate and rhythm, no murmurs rubs or gallops.  Abdomen: Bowel sounds are normal, nontender, nondistended, no hepatosplenomegaly or masses, no abdominal bruits or    hernia , no rebound or guarding.  Exam limited, had to be done in wheelchair.  Rectal: not performed Extremities: 2+lower extremity edema. Boot on left foot. No clubbing or deformities.  Neuro: Alert and oriented x 4 , grossly normal neurologically.  Skin: Warm and dry, no rash or jaundice.   Psych: Alert and cooperative,  normal mood and affect.  Labs: Hgb 10.9 04/2014.

## 2014-04-27 NOTE — Progress Notes (Signed)
Patient ID: [name], female   DOB: 1940/09/04, 73 y.o.   MRN: [id number]                PROGRESS NOTE  DATE:  04/27/2014    FACILITY: [facility]    LEVEL OF CARE:   SNF   Acute Visit   CHIEF COMPLAINT:  Follow up CHF, Surgical wound Right great toe.   HISTORY OF PRESENT ILLNESS:  This is a patient who came to [place] after a hip fracture, a nondisplaced ankle fracture, as well as fracture of her right first metatarsophalangeal joint.  She underwent surgery for all of these.  She still has an open surgical area in the right great toe.  Culture of this area grew methicillin sensitive staph aureus. Patient is allergic to penicillin and sulfa therefore I put her on Levaquin starting on 9/16. X-ray of the toe showed lipid of bone formation that may be seen with postsurgical healing although reactive osteomyelitis could appear similar. The patient still complains of a lot of pain in that toe  Last week, she was seen by our service due to increasing amounts of edema.  A chest x-ray suggested congestive heart failure.  When I saw her last on 04/13/2014, I increased her Lasix from 40 to 80 mg.  Previous echocardiogram had suggested moderate to severe LVH as well as moderate aortic stenosis.  She had clear signs of biventricular heart failure on exam.  This is much better.  REVIEW OF SYSTEMS:   CHEST/RESPIRATORY:  The patient still feels short of breath.   CARDIAC:   No chest pain.   GU:  No dysuria.    PHYSICAL EXAMINATION:   O2 SATURATIONS:  94% on room air (was off her oxygen).   PULSE:  74.   RESPIRATIONS:  18 and unlabored.    GENERAL APPEARANCE:  The patient is not in any distress.   CHEST/RESPIRATORY:  No crackles are appreciated.  This is an improvement from the other day.   CARDIOVASCULAR:  CARDIAC:  She has a grade 1-6/6 midsystolic ejection murmur, maximal at the aortic area.  No gallops.  JVP is not elevated.   EDEMA/VARICOSITIES:  No edema is seen.   SKIN:    INSPECTION:  Wound, right great toe, continues to have a sloughy surface. There is surrounding tenderness although I don't see a lot of evidence of overt infection  ASSESSMENT/PLAN:  Congestive heart failure.  This seems better at the bedside. Last lab work on 9/14 showed a BUN of 19 creatinine of 0.78 and a potassium of 5 . This will need to be repeated   Surgical wound to the right great toe.  This is going to need to be cultured.  Currently, we are using Santyl. Which should continue. She'll probably need to be seen in the wound care center in [place]. She is not a candidate for an MRI due to hardware. I am planning to extend the Levaquin to a full 6 weeks of treatment. I suspect she will need advanced treatment options they'll attempt to close this. Might be a consideration for hyperbaric oxygen although I think her cardiac status would probably preclude this  OB positive stool. I'm not precisely sure I this was done however she will see GI today

## 2014-04-27 NOTE — Telephone Encounter (Signed)
[facility] 

## 2014-04-28 LAB — GLUCOSE, CAPILLARY
Glucose-Capillary: 89 mg/dL (ref 70–99)
Glucose-Capillary: 202 mg/dL — ABNORMAL HIGH (ref 70–99)

## 2014-04-29 ENCOUNTER — Encounter: Payer: Self-pay | Admitting: Gastroenterology

## 2014-04-29 LAB — GLUCOSE, CAPILLARY
Glucose-Capillary: 162 mg/dL — ABNORMAL HIGH (ref 70–99)
Glucose-Capillary: 259 mg/dL — ABNORMAL HIGH (ref 70–99)
Glucose-Capillary: 200 mg/dL — ABNORMAL HIGH (ref 70–99)
Glucose-Capillary: 298 mg/dL — ABNORMAL HIGH (ref 70–99)

## 2014-04-29 NOTE — Assessment & Plan Note (Addendum)
73 y/o female with recent heme positive stool. Stable anemia since 02/2014 (postoperative). Patient without significant GI symptoms. Complains of hemorrhoids. Currently not fit to undergo bowel prep for colonoscopy given limited mobility. Unless she develops significant change in Hgb or develops overt bleeding, would consider monitoring for now. Repeat CBC, iron/TIBC, ferritin. Consider colonoscopy at a later date once she has recovered from her fractures.   Anusol-HC supp BID for 10 days.

## 2014-04-30 LAB — GLUCOSE, CAPILLARY
GLUCOSE-CAPILLARY: 137 mg/dL — AB (ref 70–99)
Glucose-Capillary: 205 mg/dL — ABNORMAL HIGH (ref 70–99)

## 2014-05-01 LAB — GLUCOSE, CAPILLARY
GLUCOSE-CAPILLARY: 146 mg/dL — AB (ref 70–99)
Glucose-Capillary: 141 mg/dL — ABNORMAL HIGH (ref 70–99)
Glucose-Capillary: 211 mg/dL — ABNORMAL HIGH (ref 70–99)

## 2014-05-02 ENCOUNTER — Non-Acute Institutional Stay (SKILLED_NURSING_FACILITY): Payer: Medicare HMO | Admitting: Internal Medicine

## 2014-05-02 DIAGNOSIS — Z5189 Encounter for other specified aftercare: Secondary | ICD-10-CM

## 2014-05-02 DIAGNOSIS — T8189XA Other complications of procedures, not elsewhere classified, initial encounter: Secondary | ICD-10-CM

## 2014-05-02 DIAGNOSIS — T8189XD Other complications of procedures, not elsewhere classified, subsequent encounter: Secondary | ICD-10-CM

## 2014-05-02 LAB — GLUCOSE, CAPILLARY
Glucose-Capillary: 88 mg/dL (ref 70–99)
Glucose-Capillary: 181 mg/dL — ABNORMAL HIGH (ref 70–99)
Glucose-Capillary: 151 mg/dL — ABNORMAL HIGH (ref 70–99)
Glucose-Capillary: 163 mg/dL — ABNORMAL HIGH (ref 70–99)

## 2014-05-03 LAB — GLUCOSE, CAPILLARY
GLUCOSE-CAPILLARY: 104 mg/dL — AB (ref 70–99)
GLUCOSE-CAPILLARY: 154 mg/dL — AB (ref 70–99)
Glucose-Capillary: 225 mg/dL — ABNORMAL HIGH (ref 70–99)
Glucose-Capillary: 146 mg/dL — ABNORMAL HIGH (ref 70–99)

## 2014-05-04 ENCOUNTER — Encounter: Payer: Self-pay | Admitting: Internal Medicine

## 2014-05-04 ENCOUNTER — Non-Acute Institutional Stay (SKILLED_NURSING_FACILITY): Payer: Medicare HMO | Admitting: Internal Medicine

## 2014-05-04 DIAGNOSIS — I10 Essential (primary) hypertension: Secondary | ICD-10-CM

## 2014-05-04 DIAGNOSIS — S91109D Unspecified open wound of unspecified toe(s) without damage to nail, subsequent encounter: Secondary | ICD-10-CM

## 2014-05-04 DIAGNOSIS — Z5189 Encounter for other specified aftercare: Secondary | ICD-10-CM

## 2014-05-04 DIAGNOSIS — S82891A Other fracture of right lower leg, initial encounter for closed fracture: Secondary | ICD-10-CM

## 2014-05-04 DIAGNOSIS — S82899A Other fracture of unspecified lower leg, initial encounter for closed fracture: Secondary | ICD-10-CM

## 2014-05-04 DIAGNOSIS — R195 Other fecal abnormalities: Secondary | ICD-10-CM

## 2014-05-04 DIAGNOSIS — S91109A Unspecified open wound of unspecified toe(s) without damage to nail, initial encounter: Secondary | ICD-10-CM | POA: Insufficient documentation

## 2014-05-04 LAB — GLUCOSE, CAPILLARY
Glucose-Capillary: 76 mg/dL (ref 70–99)
Glucose-Capillary: 176 mg/dL — ABNORMAL HIGH (ref 70–99)
Glucose-Capillary: 175 mg/dL — ABNORMAL HIGH (ref 70–99)
Glucose-Capillary: 149 mg/dL — ABNORMAL HIGH (ref 70–99)

## 2014-05-04 NOTE — Progress Notes (Signed)
Patient ID: [name], female   DOB: [birth year], 73 y.o.   MRN: [id number]   FACILITY: [facility]: SNF This is a discharge note.    CHIEF COMPLAINT: Discharge note .  HISTORY OF PRESENT ILLNESS: This is a patient who came to [place] after a left hip fracture, a right nondisplaced ankle fracture, as well as fracture of her right first metatarsophalangeal joint. She underwent surgery for all of these. She still has an open surgical area in the right great toe. Culture of this area grew methicillin sensitive staph aureus. Patient is allergic to penicillin and sulfa therefore I put her on Levaquin starting on 9/16. X-ray of the toe showed lipid of bone formation that may be seen with postsurgical healing although reactive osteomyelitis could appear similar.  \ This will be followed up as an outpatient.   Several weeks ago staff did note increasing amounts of edema. A chest x-ray suggested congestive heart failure. A chest x-ray suggested CHF and her Lasix was increased to 80 mg a day this appears to have stabilized most recent weight is 217.5 which has been stable here for about 3 weeks . Previous echocardiogram had suggested moderate to severe LVH as well as moderate aortic stenosis. .  She also did guaic positive for occult stool she has seen GI with recommendation to monitor her hemoglobin which has been stable consensus was to manage this conservatively unless she has overt bleeding  or her hemoglobin becomes unstable.  She does have a history of hemorrhoids and did get in when necessary Anusol order as well-  Patient does have a history of diabetes type 2 she is on Levemir 35 units twice a day and blood sugars appear to be stable running from the 80s-higher 100s in the morning-ranging from 115-259 at noon again there's quite a bit of variability.   her 6 PM blood sugars range 130 to 232 mainly in the higher 100s range  Also has a history of hypertension on numerous  agents including Coreg 3.25 mg twice a day-hydralazine 25 mg 4 times a day-Losartan 100 mg a day-as well as Lasix 80 mg a day-blood pressures are somewhat variable ranging recently from 128/64-139/46-149/52-156/72--I do not see consistent systolic elevations although this will have to be monitored by her primary care provider    initially when patient was admitted here she had significant lethargy and responsiveness issues-she was hospitalized for a short period-thoughtt to be in  respiratory failure with hypercapnia and respiratory acidosis complicated with CHF--was thought that possibly narcotics and benzodiazepines may have contributed to this-she has significantly improved in this regard and has not really been an issue clinically-she is receiving Vicodin 5-325 mg every 4 hours when necessary but appears to have tolerated this quite well  Tonight she has no acute complaints appears to be doing well she will be going home with her son and spouse who are very supportive-she will need extensive therapy as well as CNA support for help with activities of daily living as well as strengthening with her numerous fractures and toe issues.  She largely ambulates in a wheelchair -at times will use a walker and will need   it certainly with her therapy  Also nursing support for follow up of her medical conditions-   Family medical social history has been reviewed per admission note on 02/20/2049  IMedications have been reviewed per [facility]    REVIEW OF SYSTEMS Gen. no complaints of fever or chills appears  to be doing well.  Skin does not complaining of any rashes or itching does have the right great toe wound which is followed by wound care.  Head ears eyes nose mouth and throat-does not complaining of any visual changes or sore throat.  Respiratory he is not complaining of shortness of breath or cough.  Cardiac is not complaining of chest pain has some baseline lower extremity edema-again history CHF   weight appears to be  Stable:  GU does not complain of dysuria.  Muscle skeletal-extensive history as noted above but at this point pain appears to be controlled she is receiving Vicodin when necessary.  Neurologic does have a history of neuropathy he does not complaining of any syncope or headache this evening.  Psych-does have some history of anxiety this appears to be relatively stable appears to be in good spirits       PHYSICAL EXAMINATION:   Temperature 98.6 pulse 84 respirations 20 blood pressure 149/52-128/64-again there is variability here her weight appears to have stabilized around 218   GENERAL APPEARANCE: Very pleasant elderly female in no distress- resting comfortably in bed. Her skin is warm and dry --the open area on the right great toe is currently covered with dressing-this is followed by wound care I do not see any overt drainage or surrounding erythema there is some tenderness to palpation of the area around it--but this does not appear to be acute tenderness Eyes-pupils are reactive to light sclera and conjunctiva are clear visual acuity appears grossly intact.  Oropharynx is clear mucous membranes moist.  CHEST/RESPIRATORY: No crackles are appreciated.clear to auscultation with somewhat shallow air entry no labored breathing Th  CARDIOVASCULAR:  CARDIAC: She has a grade 2-7/7 midsystolic ejection murmur,. No gallops. JVP is not elevated--has her baseline lower extremity edema.  MS--she ambulates largely in a wheelchair does move all extremities x4 with  limitation of her right lower extremity  Neurologic-appears grossly intact her speech is clear there are no lateralizing findings.  Psychiatric alert and oriented x3 pleasant and appropriate 04/11/2014.   05/02/2014.  Sodium 145 potassium 4 BUN 9 creatinine 0.67--CO2 of 36.  04/11/2014.  WBC 10.5 hemoglobin 10.9 platelets 214   02/23/2014.  Liver function tests within normal limits.     .        ASSESSMENT/PLAN:  Congestive heart failure.--appearsto be stable--will recheck her renal function before discharge-- this appears to be stable  History of right distal fibula as well as left hip fracture-she will need extensive therapy again he still has significant weakness and ambulation challenges-pain appears to be controlled with the Vicodin-she is followed by orthopedics Surgical wound to the right great toe--this will need followup by wound care  She is not a candidate for an MRI due to hardware.  Levaquin   Has been extendedto a full 6 weeks of treatment. Per Dr. Janalyn Rouse assessment she will need advanced treatment options tin attempt to close this. Might be a consideration for hyperbaric oxygen although  cardiac status may prove problematic--this again apparently will be followed by orthopedics as well as possibly wound care center in [place] OB positive stool. again this  has been addressed by GI-with orders to monitor hemoglobin-conservative followup if no overt bleeding and hemoglobin remained stable thought not to be in a candidate for aggressive intervention colonoscopy until her orthopedic issues are improved-- update CBC before discharge  Diabetes type 2-again there is some variation she is on Levemir twice a day-I do not see consistent elevations will defer  aggressive followup per primary care provider.--She does have orders for NovoLog if blood sugar is above 200  Peripheral neuropathy- does continue on Neurontin   Hypertension-again there are variable readings but I do not see consistent systolic elevations-continues on losartan-Coreg-hydralazine-also on Lasix   Patient will be going home with her spouse and son who are very supportive-she will need continued PT and OT for strengthening with her significant orthopedic history-also will need CNA support for help with her activities of daily living-as well as nursing support for her multiple medical issues-she also will need a rolling  walker as well as wheelchair for ambulation she again continues with significant weakness and will need extensive ambulatory support.   [id number] note greater than 30 minutes spent on this discharge summary

## 2014-05-04 NOTE — Progress Notes (Addendum)
Patient ID: [name], female   DOB: 07/29/1941, 73 y.o.   MRN: [id number]               PROGRESS NOTE  DATE:  05/02/2014    FACILITY: [facility]    LEVEL OF CARE:   SNF   Acute Visit   CHIEF COMPLAINT:  Review of right great toe.    HISTORY OF PRESENT ILLNESS:  This is a patient who came to [place] after a hip fracture, a nondisplaced ankle fracture, as well as a fracture of her right first metatarsopharyngeal joint.  She underwent surgery for all of these.    She has generally done very well except she has been left with a surgical opening in the right great toe.  Culture of this grew methicillin-sensitive Staph aureus.  She has been on Levaquin since 04/20/2014.  X-rays of the toe showed postsurgical healing, although reactive osteomyelitis could appear similar.  The patient's wound has generally cleaned up quite nicely.  However, it has been left with exposed tendon.      PHYSICAL EXAMINATION:   SKIN:  INSPECTION:  Right great toe:  Once again, there is exposed tendon here without obvious infection.  The area around the actual wound is dark, but not tender and there is no warmth.    ASSESSMENT/PLAN:  Right great toe wound.  This has exposed tendon.  She will need collagen and hydrogel.  Ultimately, I think this is going to need an advanced treatment option, possibly Apligraf, to stimulate granulation around the tendon.  We will arrange for her to see the wound care center, either in [place] or [place], when she leaves here.    Methicillin-sensitive Staph aureus infection.  I put her on six weeks of Levaquin for fear of underlying osteomyelitis.  She will require another month when she leaves here.  We will give her probiotics along with this.

## 2014-05-05 LAB — GLUCOSE, CAPILLARY
GLUCOSE-CAPILLARY: 219 mg/dL — AB (ref 70–99)
GLUCOSE-CAPILLARY: 208 mg/dL — AB (ref 70–99)
Glucose-Capillary: 56 mg/dL — ABNORMAL LOW (ref 70–99)

## 2014-05-05 NOTE — Progress Notes (Signed)
cc'ed to pcp °

## 2014-05-06 LAB — GLUCOSE, CAPILLARY
GLUCOSE-CAPILLARY: 158 mg/dL — AB (ref 70–99)
Glucose-Capillary: 140 mg/dL — ABNORMAL HIGH (ref 70–99)
Glucose-Capillary: 251 mg/dL — ABNORMAL HIGH (ref 70–99)
Glucose-Capillary: 176 mg/dL — ABNORMAL HIGH (ref 70–99)

## 2014-05-07 LAB — GLUCOSE, CAPILLARY
GLUCOSE-CAPILLARY: 321 mg/dL — AB (ref 70–99)
GLUCOSE-CAPILLARY: 160 mg/dL — AB (ref 70–99)
Glucose-Capillary: 169 mg/dL — ABNORMAL HIGH (ref 70–99)
Glucose-Capillary: 257 mg/dL — ABNORMAL HIGH (ref 70–99)

## 2014-05-08 LAB — GLUCOSE, CAPILLARY
GLUCOSE-CAPILLARY: 93 mg/dL (ref 70–99)
Glucose-Capillary: 281 mg/dL — ABNORMAL HIGH (ref 70–99)
Glucose-Capillary: 235 mg/dL — ABNORMAL HIGH (ref 70–99)

## 2014-05-09 DIAGNOSIS — S92311D Displaced fracture of first metatarsal bone, right foot, subsequent encounter for fracture with routine healing: Secondary | ICD-10-CM

## 2014-05-09 DIAGNOSIS — S72002D Fracture of unspecified part of neck of left femur, subsequent encounter for closed fracture with routine healing: Secondary | ICD-10-CM

## 2014-05-09 DIAGNOSIS — I5030 Unspecified diastolic (congestive) heart failure: Secondary | ICD-10-CM

## 2014-05-09 DIAGNOSIS — S82891D Other fracture of right lower leg, subsequent encounter for closed fracture with routine healing: Secondary | ICD-10-CM

## 2014-06-07 ENCOUNTER — Encounter: Payer: Self-pay | Admitting: Internal Medicine

## 2014-06-09 ENCOUNTER — Encounter (HOSPITAL_BASED_OUTPATIENT_CLINIC_OR_DEPARTMENT_OTHER): Payer: Medicare HMO

## 2014-07-13 ENCOUNTER — Telehealth: Payer: Self-pay | Admitting: Gastroenterology

## 2014-07-13 NOTE — Telephone Encounter (Signed)
Please let patient know she is due CBC, iron/tibc, ferritin. H/o anemia.

## 2014-07-18 ENCOUNTER — Other Ambulatory Visit: Payer: Self-pay | Admitting: Gastroenterology

## 2014-07-18 ENCOUNTER — Other Ambulatory Visit: Payer: Self-pay

## 2014-07-18 DIAGNOSIS — D638 Anemia in other chronic diseases classified elsewhere: Secondary | ICD-10-CM

## 2014-07-18 NOTE — Telephone Encounter (Signed)
Letter and lab orders mailed to the pt.  

## 2015-02-28 ENCOUNTER — Other Ambulatory Visit: Payer: Self-pay | Admitting: Nurse Practitioner

## 2015-02-28 DIAGNOSIS — Z13820 Encounter for screening for osteoporosis: Secondary | ICD-10-CM

## 2015-03-14 ENCOUNTER — Ambulatory Visit: Admission: RE | Admit: 2015-03-14 | Payer: Medicare HMO | Source: Ambulatory Visit

## 2015-09-20 ENCOUNTER — Encounter: Payer: Medicare HMO | Admitting: Vascular Surgery

## 2015-09-20 ENCOUNTER — Encounter (HOSPITAL_COMMUNITY): Payer: Medicare HMO

## 2015-10-18 ENCOUNTER — Other Ambulatory Visit: Payer: Self-pay

## 2015-10-18 DIAGNOSIS — I739 Peripheral vascular disease, unspecified: Secondary | ICD-10-CM

## 2015-12-01 ENCOUNTER — Encounter: Payer: Self-pay | Admitting: Vascular Surgery

## 2015-12-08 ENCOUNTER — Encounter: Payer: Self-pay | Admitting: Vascular Surgery

## 2015-12-08 ENCOUNTER — Ambulatory Visit (HOSPITAL_COMMUNITY)
Admission: RE | Admit: 2015-12-08 | Discharge: 2015-12-08 | Disposition: A | Discharge status: Home / Self Care | Payer: Medicare HMO | Source: Ambulatory Visit | Attending: Vascular Surgery | Admitting: Vascular Surgery

## 2015-12-08 ENCOUNTER — Ambulatory Visit (INDEPENDENT_AMBULATORY_CARE_PROVIDER_SITE_OTHER)
Admission: RE | Admit: 2015-12-08 | Discharge: 2015-12-08 | Disposition: A | Discharge status: Home / Self Care | Payer: Medicare HMO | Source: Ambulatory Visit | Attending: Vascular Surgery | Admitting: Vascular Surgery

## 2015-12-08 ENCOUNTER — Ambulatory Visit (INDEPENDENT_AMBULATORY_CARE_PROVIDER_SITE_OTHER): Payer: Medicare HMO | Admitting: Vascular Surgery

## 2015-12-08 VITALS — BP 145/53 | HR 69 | Temp 97.5°F | Resp 14 | Ht 65.0 in | Wt 210.0 lb

## 2015-12-08 DIAGNOSIS — I7025 Atherosclerosis of native arteries of other extremities with ulceration: Secondary | ICD-10-CM | POA: Diagnosis not present

## 2015-12-08 DIAGNOSIS — I10 Essential (primary) hypertension: Secondary | ICD-10-CM | POA: Diagnosis not present

## 2015-12-08 DIAGNOSIS — I739 Peripheral vascular disease, unspecified: Secondary | ICD-10-CM

## 2015-12-08 DIAGNOSIS — E11621 Type 2 diabetes mellitus with foot ulcer: Secondary | ICD-10-CM | POA: Diagnosis not present

## 2015-12-08 DIAGNOSIS — I70219 Atherosclerosis of native arteries of extremities with intermittent claudication, unspecified extremity: Secondary | ICD-10-CM

## 2015-12-08 DIAGNOSIS — L97519 Non-pressure chronic ulcer of other part of right foot with unspecified severity: Secondary | ICD-10-CM | POA: Diagnosis not present

## 2015-12-08 DIAGNOSIS — E785 Hyperlipidemia, unspecified: Secondary | ICD-10-CM | POA: Diagnosis not present

## 2015-12-08 DIAGNOSIS — I70269 Atherosclerosis of native arteries of extremities with gangrene, unspecified extremity: Secondary | ICD-10-CM | POA: Insufficient documentation

## 2015-12-08 NOTE — Progress Notes (Signed)
CONSULT    CC:  Non healing wound right foot Referring Provider:  Ricard Dillon, MD  HPI: This is a 75 y.o. female who presents today with a non healing wound on the top of her right foot.  She states that she broke her right great toe July 2015.  She subsequently had surgery and a rod was placed.  Sometime last year, the rod was taken out and she has had the wound ever since.  She states that she has had a wound vac and it was taken off on Monday and Silver Ag is now being used on the wound every other day.  She does have swelling in her legs bilaterally.  The only other surgery she has had was on her left femur.    She states that she gets around with a walker.  She denies any pain in her feet at rest.  She states she does not get cramping in her calves.  She is diabetic and takes insulin.  She also has hypertension and takes a beta blocker, ARB, and lasix.  She takes a daily aspirin.  She is on a statin for cholesterol management.  She states that her mother died at age 60 with a heart attack.   She has never smoked or drank alcohol.    She is followed at [facility] for her non healing wound. (Dr. Dulce Sellar).   Past Medical History  Diagnosis Date  . HTN (hypertension)   . Diabetes mellitus without complication ([place])   . High cholesterol   . Spasm of back muscles   . Aortic stenosis     Past Surgical History  Procedure Laterality Date  . Intramedullary (im) nail intertrochanteric Left 02/04/2014    Procedure: INTRAMEDULLARY (IM) NAIL INTERTROCHANTRIC FEMORAL;  Surgeon: Mauri Pole, MD;  Location: [place];  Service: Orthopedics;  Laterality: Left;  . Orif toe fracture Right 02/08/2014    Procedure: OPEN REDUCTION INTERNAL FIXATION Right METATARSAL  FRACTURE ;  Surgeon: Wylene Simmer, MD;  Location: [facility];  Service: Orthopedics;  Laterality: Right;    Allergies  Allergen Reactions  . Iodine Anaphylaxis  . Penicillins Anaphylaxis  . Shellfish Allergy Anaphylaxis  .  Sulfa Antibiotics Anaphylaxis    Current Outpatient Prescriptions  Medication Sig Dispense Refill  . acetaminophen (TYLENOL) 325 MG tablet Take 2 tablets (650 mg total) by mouth every 6 (six) hours as needed for mild pain.    [name] aspirin EC 81 MG tablet Take 325 mg by mouth daily.     [name] atorvastatin (LIPITOR) 20 MG tablet Take 20 mg by mouth at bedtime.    . carvedilol (COREG) 3.125 MG tablet Take 1 tablet (3.125 mg total) by mouth 2 (two) times daily with a meal.    . cetirizine (ZYRTEC) 10 MG tablet Take 10 mg by mouth daily as needed for allergies.    [name] docusate sodium (COLACE) 100 MG capsule Take 100 mg by mouth 2 (two) times daily.    . fluticasone (FLONASE) 50 MCG/ACT nasal spray Place 1 spray into both nostrils daily.    . furosemide (LASIX) 20 MG tablet Take 80 mg by mouth daily.    . hydrALAZINE (APRESOLINE) 25 MG tablet Take 1 tablet (25 mg total) by mouth 4 (four) times daily.    . insulin aspart (NOVOLOG) 100 UNIT/ML injection Inject 0-15 Units into the skin 3 (three) times daily with meals. Before each meal 3 times a day, 140-199 - 3 units, 200-250 -  5 units, 251-299 - 8 units,  300-349 - 12 units,  350 or above 15 units. 10 mL 11  . insulin detemir (LEVEMIR) 100 UNIT/ML injection Inject 35 Units into the skin 2 (two) times daily.    [name] levofloxacin (LEVAQUIN) 500 MG tablet Take 500 mg by mouth daily.    [name] losartan (COZAAR) 100 MG tablet Take 1 tablet (100 mg total) by mouth daily.    . NON FORMULARY K-Dur  20 meq   Once daily    . NON FORMULARY Milk of magnesia   prn    . omeprazole (PRILOSEC) 20 MG capsule Take 20 mg by mouth daily.    . polyethylene glycol (MIRALAX / GLYCOLAX) packet Take 17 g by mouth 2 (two) times daily. 14 each 0  . pregabalin (LYRICA) 50 MG capsule Take 50 mg by mouth 3 (three) times daily.    [name] gabapentin (NEURONTIN) 300 MG capsule Take 300 mg by mouth 3 (three) times daily. Reported on 12/08/2015    . HYDROcodone-acetaminophen (NORCO/VICODIN) 5-325 MG per  tablet Take one tablet by mouth every 4 hours as needed for pain (Patient not taking: Reported on 12/08/2015) 180 tablet 0  . hydrocortisone (ANUSOL-HC) 2.5 % rectal cream Apply 1 application topically 4 (four) times daily as needed. Reported on 12/08/2015    . hydrocortisone (ANUSOL-HC) 25 MG suppository Place 1 suppository (25 mg total) rectally 2 (two) times daily. (Patient not taking: Reported on 12/08/2015) 28 suppository 0   No current facility-administered medications for this visit.    Family History  Problem Relation Age of Onset  . Diabetes Other     Social History   Social History  . Marital Status: Married    Spouse Name: N/A  . Number of Children: N/A  . Years of Education: N/A   Occupational History  . Not on file.   Social History Main Topics  . Smoking status: Never Smoker   . Smokeless tobacco: Never Used     Comment: Never smoked  . Alcohol Use: No  . Drug Use: No  . Sexual Activity: No   Other Topics Concern  . Not on file   Social History Narrative     REVIEW OF SYSTEMS:   [X]  denotes positive finding, [ ]  denotes negative finding Cardiac  Comments:  Chest pain or chest pressure:    Shortness of breath upon exertion:    Short of breath when lying flat: x   Irregular heart rhythm:        Vascular    Pain in calf, thigh, or hip brought on by ambulation:    Pain in feet at night that wakes you up from your sleep:  x   Blood clot in your veins:    Leg swelling:  x       Pulmonary    Oxygen at home:    Productive cough:     Wheezing:         Neurologic    Sudden weakness in arms   x   Sudden numbness in arms   x   Sudden onset of difficulty speaking or slurred speech:    Temporary loss of vision in one eye:     Problems with dizziness:         Gastrointestinal    Blood in stool:  x Hx + hemoccult 2015  Vomited blood:         Genitourinary    Burning when urinating:     Blood in  urine:        Psychiatric    Major depression:           Hematologic    Bleeding problems:    Problems with blood clotting too easily:        Skin    Rashes or ulcers:        Constitutional    Fever or chills:      PHYSICAL EXAMINATION:  Filed Vitals:   12/08/15 1314  BP: 145/53  Pulse: 69  Temp: 97.5 F (36.4 C)  Resp: 14   Body mass index is 34.95 kg/(m^2).  General:  WDWN in NAD; vital signs documented above Gait: Not observed HENT: WNL, normocephalic Pulmonary: normal non-labored breathing , without Rales, rhonchi,  wheezing Cardiac: regular HR, without  Murmurs, rubs or gallops; without carotid bruits Abdomen: soft, NT, no masses Skin: without rashes Vascular Exam/Pulses:  Right Left  Radial 2+ (normal) 2+ (normal)  Ulnar Unable to palpate  Unable to palpate   Femoral Palpable, but difficult to assess due to body habitus  Palpable, but difficult to assess due to body habitus  DP Unable to palpate  Unable to palpate   PT Unable to palpate  Unable to palpate    Extremities:  Non healing wound dorsum of right foot proximal to right great toe measuing 3cm x 1cm; ~ 68mm depth.  No drainage present; minimal granulation tissue; does not appear infected.  + BLE edema Neurologic: A&O X 3;  No focal weakness or paresthesias are detected Psychiatric:  The pt has Normal affect. LYMPH:  No Cervical, Axillary, or Inguinal lymphadenopathy    Non-Invasive Vascular Imaging:   ABI's 12/08/15: Right:  0.59 Left:  0.38  -Digital waveform on the left dampened with Amp:  35mm -Digital waveform on the right dampened with Amp:  72mm  -DP/PT waveforms bilaterally are monophasic  Arterial duplex 12/08/15: -Unable to visualize popliteal, PTA, peroneal bilaterally  Previous ABI's at outside facility 06/06/14: Right:  0.56 Left:  0.41  Pt meds includes: Statin:  Yes.   Beta Blocker:  Yes.   Aspirin:  Yes.   ACEI:  No. ARB:  Yes.   Other Antiplatelet/Anticoagulant:  No.    ASSESSMENT/PLAN:: 75 y.o. female with a non healing  wound on the dorsum of the right foot for more than a year with decreased ABI's (atherosclerosis with ulceration, critical limb ischemia), DM  -pt is a diabetic with decreased ABI's and her digital waveform is severely dampened.  On the arterial duplex, the popliteal,posterior tibial, and peroneal are unable to be visualized.   -given the length of time the wound has been present and not healing, Dr. Bridgett Larsson has recommended an aortogram with bilateral lower extremity runoff with possible intervention, which will  Be scheduled on Dec 18, 2015.  If there is a lesion to intervene on, he will do at the time of the aortogram.  May possibly need surgical intervention, but that will be determined when the aortogram is complete.     Leontine Locket, PA-C [facility] [phone]  Clinic MD:  Pt seen and examined in conjunction with Dr. Bridgett Larsson  Addendum  I have independently interviewed and examined the patient, and I agree with the physician assistant's findings.  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  Pt will need Ao, BRo, possible right leg runoff.  This is scheduled 15 MAY 17.   I discussed with the patient  the nature of angiographic procedures, especially the limited patencies of any endovascular intervention.    The patient is aware of that the risks of an angiographic procedure include but are not limited to: bleeding, infection, access site complications, renal failure, embolization, rupture of vessel, dissection, arteriovenous fistula, possible need for emergent surgical intervention, possible need for surgical procedures to treat the patient's pathology, anaphylactic reaction to contrast, and stroke and death.    The patient is aware of the risks and agrees to proceed.    Adele Barthel, MD [facility] [place] Office: [phone] Pager:  [phone]  12/08/2015, 3:27 PM

## 2015-12-11 ENCOUNTER — Other Ambulatory Visit: Payer: Self-pay

## 2015-12-11 ENCOUNTER — Encounter: Payer: Self-pay | Admitting: Surgery

## 2015-12-28 ENCOUNTER — Other Ambulatory Visit: Payer: Self-pay | Admitting: *Deleted

## 2015-12-28 ENCOUNTER — Encounter (HOSPITAL_COMMUNITY): Payer: Self-pay | Admitting: Vascular Surgery

## 2015-12-28 ENCOUNTER — Encounter (HOSPITAL_COMMUNITY)
Admission: RE | Disposition: A | Discharge status: Home / Self Care | Payer: Self-pay | Source: Ambulatory Visit | Attending: Vascular Surgery

## 2015-12-28 ENCOUNTER — Ambulatory Visit (HOSPITAL_COMMUNITY)
Admission: RE | Admit: 2015-12-28 | Discharge: 2015-12-28 | Disposition: A | Discharge status: Home / Self Care | Payer: Medicare HMO | Source: Ambulatory Visit | Attending: Vascular Surgery | Admitting: Vascular Surgery

## 2015-12-28 DIAGNOSIS — Z7982 Long term (current) use of aspirin: Secondary | ICD-10-CM | POA: Diagnosis not present

## 2015-12-28 DIAGNOSIS — Z794 Long term (current) use of insulin: Secondary | ICD-10-CM | POA: Insufficient documentation

## 2015-12-28 DIAGNOSIS — I70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 of foot: Secondary | ICD-10-CM | POA: Insufficient documentation

## 2015-12-28 DIAGNOSIS — I70269 Atherosclerosis of native arteries of extremities with gangrene, unspecified extremity: Secondary | ICD-10-CM | POA: Diagnosis present

## 2015-12-28 DIAGNOSIS — I70238 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 of lower right leg: Secondary | ICD-10-CM | POA: Diagnosis not present

## 2015-12-28 DIAGNOSIS — E119 Type 2 diabetes mellitus without complications: Secondary | ICD-10-CM | POA: Diagnosis not present

## 2015-12-28 DIAGNOSIS — I10 Essential (primary) hypertension: Secondary | ICD-10-CM | POA: Diagnosis not present

## 2015-12-28 DIAGNOSIS — L97519 Non-pressure chronic ulcer of other part of right foot with unspecified severity: Secondary | ICD-10-CM | POA: Diagnosis present

## 2015-12-28 DIAGNOSIS — Z79899 Other long term (current) drug therapy: Secondary | ICD-10-CM | POA: Insufficient documentation

## 2015-12-28 DIAGNOSIS — I70219 Atherosclerosis of native arteries of extremities with intermittent claudication, unspecified extremity: Secondary | ICD-10-CM

## 2015-12-28 DIAGNOSIS — I739 Peripheral vascular disease, unspecified: Secondary | ICD-10-CM

## 2015-12-28 DIAGNOSIS — Z7951 Long term (current) use of inhaled steroids: Secondary | ICD-10-CM | POA: Diagnosis not present

## 2015-12-28 DIAGNOSIS — Z01810 Encounter for preprocedural cardiovascular examination: Secondary | ICD-10-CM

## 2015-12-28 HISTORY — PX: PERIPHERAL VASCULAR CATHETERIZATION: SHX172C

## 2015-12-28 LAB — POCT I-STAT, CHEM 8
BUN: 19 mg/dL (ref 6–20)
CALCIUM ION: 1.21 mmol/L (ref 1.13–1.30)
Chloride: 103 mmol/L (ref 101–111)
Creatinine, Ser: 0.8 mg/dL (ref 0.44–1.00)
GLUCOSE: 76 mg/dL (ref 65–99)
HCT: 40 % (ref 36.0–46.0)
Hemoglobin: 13.6 g/dL (ref 12.0–15.0)
Potassium: 3.7 mmol/L (ref 3.5–5.1)
SODIUM: 143 mmol/L (ref 135–145)
TCO2: 30 mmol/L (ref 0–100)

## 2015-12-28 LAB — GLUCOSE, CAPILLARY
GLUCOSE-CAPILLARY: 80 mg/dL (ref 65–99)
GLUCOSE-CAPILLARY: 87 mg/dL (ref 65–99)
GLUCOSE-CAPILLARY: 288 mg/dL — AB (ref 65–99)
Glucose-Capillary: 68 mg/dL (ref 65–99)

## 2015-12-28 SURGERY — ABDOMINAL AORTOGRAM W/LOWER EXTREMITY
Anesthesia: LOCAL

## 2015-12-28 MED ORDER — HYDRALAZINE HCL 20 MG/ML IJ SOLN
INTRAMUSCULAR | Status: DC | PRN
  Administered 2015-12-28 (×2): 10 mg via INTRAVENOUS

## 2015-12-28 MED ORDER — LIDOCAINE HCL (PF) 1 % IJ SOLN
INTRAMUSCULAR | Status: AC
  Filled 2015-12-28: qty 30

## 2015-12-28 MED ORDER — METHYLPREDNISOLONE SODIUM SUCC 125 MG IJ SOLR
INTRAMUSCULAR | Status: AC
  Filled 2015-12-28: qty 2

## 2015-12-28 MED ORDER — SODIUM CHLORIDE 0.9 % IV SOLN: 1.0000 mL/kg/h | INTRAVENOUS | Status: DC

## 2015-12-28 MED ORDER — HEPARIN (PORCINE) IN NACL 2-0.9 UNIT/ML-% IJ SOLN
INTRAMUSCULAR | Status: AC
  Filled 2015-12-28: qty 1000

## 2015-12-28 MED ORDER — FAMOTIDINE IN NACL 20-0.9 MG/50ML-% IV SOLN
20.0000 mg | Freq: Once | INTRAVENOUS | Status: AC
  Administered 2015-12-28: 20 mg via INTRAVENOUS

## 2015-12-28 MED ORDER — ACETAMINOPHEN 325 MG PO TABS: 650.0000 mg | ORAL_TABLET | ORAL | Status: DC | PRN

## 2015-12-28 MED ORDER — FENTANYL CITRATE (PF) 100 MCG/2ML IJ SOLN
INTRAMUSCULAR | Status: DC | PRN
  Administered 2015-12-28: 50 ug via INTRAVENOUS

## 2015-12-28 MED ORDER — FENTANYL CITRATE (PF) 100 MCG/2ML IJ SOLN
INTRAMUSCULAR | Status: AC
  Filled 2015-12-28: qty 2

## 2015-12-28 MED ORDER — MORPHINE SULFATE (PF) 2 MG/ML IV SOLN: 2.0000 mg | INTRAVENOUS | Status: DC | PRN

## 2015-12-28 MED ORDER — HYDRALAZINE HCL 20 MG/ML IJ SOLN
INTRAMUSCULAR | Status: AC
  Filled 2015-12-28: qty 1

## 2015-12-28 MED ORDER — OXYCODONE-ACETAMINOPHEN 5-325 MG PO TABS: 1.0000 | ORAL_TABLET | Freq: Four times a day (QID) | ORAL | Status: DC | PRN

## 2015-12-28 MED ORDER — IODIXANOL 320 MG/ML IV SOLN
INTRAVENOUS | Status: DC | PRN
  Administered 2015-12-28: 110 mL via INTRAVENOUS

## 2015-12-28 MED ORDER — FAMOTIDINE IN NACL 20-0.9 MG/50ML-% IV SOLN
INTRAVENOUS | Status: AC
  Filled 2015-12-28: qty 50

## 2015-12-28 MED ORDER — MIDAZOLAM HCL 2 MG/2ML IJ SOLN
INTRAMUSCULAR | Status: DC | PRN
  Administered 2015-12-28: 1 mg via INTRAVENOUS

## 2015-12-28 MED ORDER — DIPHENHYDRAMINE HCL 50 MG/ML IJ SOLN
25.0000 mg | Freq: Once | INTRAMUSCULAR | Status: AC
  Administered 2015-12-28: 25 mg via INTRAVENOUS

## 2015-12-28 MED ORDER — HYDRALAZINE HCL 20 MG/ML IJ SOLN: 10.0000 mg | INTRAMUSCULAR | Status: DC | PRN

## 2015-12-28 MED ORDER — METHYLPREDNISOLONE SODIUM SUCC 125 MG IJ SOLR
125.0000 mg | Freq: Once | INTRAMUSCULAR | Status: AC
  Administered 2015-12-28: 125 mg via INTRAVENOUS

## 2015-12-28 MED ORDER — LIDOCAINE HCL (PF) 1 % IJ SOLN
INTRAMUSCULAR | Status: DC | PRN
  Administered 2015-12-28: 20 mL via SUBCUTANEOUS

## 2015-12-28 MED ORDER — DIPHENHYDRAMINE HCL 50 MG/ML IJ SOLN
INTRAMUSCULAR | Status: AC
  Filled 2015-12-28: qty 1

## 2015-12-28 MED ORDER — MIDAZOLAM HCL 2 MG/2ML IJ SOLN
INTRAMUSCULAR | Status: AC
  Filled 2015-12-28: qty 2

## 2015-12-28 MED ORDER — HEPARIN (PORCINE) IN NACL 2-0.9 UNIT/ML-% IJ SOLN
INTRAMUSCULAR | Status: DC | PRN
  Administered 2015-12-28: 1000 mL

## 2015-12-28 MED ORDER — SODIUM CHLORIDE 0.9 % IV SOLN
INTRAVENOUS | Status: DC
  Administered 2015-12-28: 09:00:00 via INTRAVENOUS

## 2015-12-28 SURGICAL SUPPLY — 11 items
CATH OMNI FLUSH 5F 65CM (CATHETERS) ×2 IMPLANT
CATH STRAIGHT 5FR 65CM (CATHETERS) ×2 IMPLANT
COVER PRB 48X5XTLSCP FOLD TPE (BAG) ×1 IMPLANT
COVER PROBE 5X48 (BAG) ×1
KIT MICROINTRODUCER STIFF 5F (SHEATH) ×2 IMPLANT
KIT PV (KITS) ×2 IMPLANT
SHEATH PINNACLE 5F 10CM (SHEATH) ×2 IMPLANT
SYR MEDRAD MARK V 150ML (SYRINGE) ×2 IMPLANT
TRANSDUCER W/STOPCOCK (MISCELLANEOUS) ×2 IMPLANT
TRAY PV CATH (CUSTOM PROCEDURE TRAY) ×2 IMPLANT
WIRE BENTSON .035X145CM (WIRE) ×2 IMPLANT

## 2015-12-28 NOTE — Interval H&P Note (Signed)
History and Physical Interval Note:  12/28/2015 8:42 AM  [name]  has presented today for surgery, with the diagnosis of pvd  The various methods of treatment have been discussed with the patient and family. After consideration of risks, benefits and other options for treatment, the patient has consented to  Procedure(s): Abdominal Aortogram w/Lower Extremity (N/A) as a surgical intervention .  The patient's history has been reviewed, patient examined, no change in status, stable for surgery.  I have reviewed the patient's chart and labs.  Questions were answered to the patient's satisfaction.     Adele Barthel

## 2015-12-28 NOTE — Op Note (Signed)
OPERATIVE NOTE   PROCEDURE: 1.  Left common femoral artery cannulation under ultrasound guidance 2.  Placement of catheter in aorta 3.  Aortogram 4.  Conscious sedation for 22 min 5.  Second order arterial selection 6.  Right leg runoff via catheter 7.  Left leg runoff via sheath  PRE-OPERATIVE DIAGNOSIS: right foot ulcers  POST-OPERATIVE DIAGNOSIS: same as above   SURGEON: Adele Barthel, MD  ANESTHESIA: conscious sedation  ESTIMATED BLOOD LOSS: 50 cc  CONTRAST: 110 cc  FINDING(S):  Aorta: patent  Superior mesenteric artery: patent Celiac artery: not well visualizd   Right Left  RA patent patent  CIA patent patent  EIA patent patent  IIA patent patent  CFA patent patent  SFA patent patent  PFA patent patent  Pop patent patent  Trif Occluded Occluded  AT Multiple occlusions with collaterals continuing flow down into foot Multiple occlusions with collaterals continuing flow down into foot  Pero Occluded Occluded  PT Occluded Occluded   SPECIMEN(S):  none  INDICATIONS:   [name] is a 75 y.o. female who presents with right foot ulcers.  The patient presents for: aortogram, bilateral leg runoff, and possible intervention right foot.  I discussed with the patient the nature of angiographic procedures, especially the limited patencies of any endovascular intervention.  The patient is aware of that the risks of an angiographic procedure include but are not limited to: bleeding, infection, access site complications, renal failure, embolization, rupture of vessel, dissection, possible need for emergent surgical intervention, possible need for surgical procedures to treat the patient's pathology, and stroke and death.  The patient is aware of the risks and agrees to proceed.  DESCRIPTION: After full informed consent was obtained from the patient, the patient was brought back to the angiography suite.  The patient was placed supine upon the angiography table and  connected to cardiopulmonary monitoring equipment.  The patient was then given conscious sedation, the amounts of which are documented in the patient's chart.  The patient was prepped and drape in the standard fashion for an angiographic procedure.  At this point, attention was turned to the left groin.The Omniflush catheter was then loaded over the wire up to the level of L1.  The catheter was connected to the power injector circuit.  After de-airring and de-clotting the circuit, a power injector aortogram was completed.  The findings are listed above.  The Bentson wire was replaced in the catheter, and using the Bentson and Omniflush catheter, the right common iliac artery was selected.  The wire was advanced into the external iliac artery.  The catheter would not cross the bifurcation, so it was exchanged for a straight catheter.  This was able to advance into the right external iliac artery.   An automated right leg runoff was completed after de-airring and de-clotting the circuit.  The findings are as listed above.    The [facility] wire was replaced in the catheter to straighten the crook of the catheter.  Both were removed together from the sheath.  The left sheath was aspirated and no clot was present.  The sheath was connected to the power injector circuit.  An automated left leg runoff was completed after de-airring and de-clotting the circuit.  The findings are as listed above.  The sheath was aspirated.  No clots were present and the sheath was reloaded with heparinized saline.    Based on the images, this patient needs: right common femoral artery to mid-anterior tibial artery bypass.  COMPLICATIONS: none  CONDITION: stable   Adele Barthel, MD [facility] [place] Office: [phone] Pager: [phone]  12/28/2015, 11:02 AM

## 2015-12-28 NOTE — Progress Notes (Signed)
Pt describing discomfort of tingling sensation to left jaw, neck and down to chest.  No changes in VS and repeat EKG shows no changes.  Pt is unsure of which meds that she has taken as they are all mixed together in tin foil packets. Family unable to provide ID of meds. Description of pain and location of pain is hard to follow.  Dr Bridgett Larsson notified of her complaints

## 2015-12-28 NOTE — Discharge Instructions (Signed)
Angiogram, Care After °Refer to this sheet in the next few weeks. These instructions provide you with information about caring for yourself after your procedure. Your health care provider may also give you more specific instructions. Your treatment has been planned according to current medical practices, but problems sometimes occur. Call your health care provider if you have any problems or questions after your procedure. °WHAT TO EXPECT AFTER THE PROCEDURE °After your procedure, it is typical to have the following: °· Bruising at the catheter insertion site that usually fades within 1-2 weeks. °· Blood collecting in the tissue (hematoma) that may be painful to the touch. It should usually decrease in size and tenderness within 1-2 weeks. °HOME CARE INSTRUCTIONS °· Take medicines only as directed by your health care provider. °· You may shower 24-48 hours after the procedure or as directed by your health care provider. Remove the bandage (dressing) and gently wash the site with plain soap and water. Pat the area dry with a clean towel. Do not rub the site, because this may cause bleeding. °· Do not take baths, swim, or use a hot tub until your health care provider approves. °· Check your insertion site every day for redness, swelling, or drainage. °· Do not apply powder or lotion to the site. °· Do not lift over 10 lb (4.5 kg) for 5 days after your procedure or as directed by your health care provider. °· Ask your health care provider when it is okay to: °¨ Return to work or school. °¨ Resume usual physical activities or sports. °¨ Resume sexual activity. °· Do not drive home if you are discharged the same day as the procedure. Have someone else drive you. °· You may drive 24 hours after the procedure unless otherwise instructed by your health care provider. °· Do not operate machinery or power tools for 24 hours after the procedure or as directed by your health care provider. °· If your procedure was done as an  outpatient procedure, which means that you went home the same day as your procedure, a responsible adult should be with you for the first 24 hours after you arrive home. °· Keep all follow-up visits as directed by your health care provider. This is important. °SEEK MEDICAL CARE IF: °· You have a fever. °· You have chills. °· You have increased bleeding from the catheter insertion site. Hold pressure on the site. °SEEK IMMEDIATE MEDICAL CARE IF: °· You have unusual pain at the catheter insertion site. °· You have redness, warmth, or swelling at the catheter insertion site. °· You have drainage (other than a small amount of blood on the dressing) from the catheter insertion site. °· The catheter insertion site is bleeding, and the bleeding does not stop after 30 minutes of holding steady pressure on the site. °· The area near or just beyond the catheter insertion site becomes pale, cool, tingly, or numb. °  °This information is not intended to replace advice given to you by your health care provider. Make sure you discuss any questions you have with your health care provider. °  °Document Released: 02/07/2005 Document Revised: 08/12/2014 Document Reviewed: 12/23/2012 °Elsevier Interactive Patient Education ©2016 [employer] ° °

## 2015-12-28 NOTE — Progress Notes (Signed)
Site area: lt groin Site Prior to Removal:  Level 0 Pressure Applied For:  20 minutes Manual:   yes Patient Status During Pull:  stable Post Pull Site:  Level  0 Post Pull Instructions Given:  yes Post Pull Pulses Present: yes Dressing Applied:  tegaderm Bedrest begins @ 1120 Comments:  Voided incont. lg amt. Pad changed

## 2015-12-28 NOTE — H&P (View-Only) (Signed)
CONSULT    CC:  Non healing wound right foot Referring Provider:  Ricard Dillon, MD  HPI: This is a 75 y.o. female who presents today with a non healing wound on the top of her right foot.  She states that she broke her right great toe July 2015.  She subsequently had surgery and a rod was placed.  Sometime last year, the rod was taken out and she has had the wound ever since.  She states that she has had a wound vac and it was taken off on Monday and Silver Ag is now being used on the wound every other day.  She does have swelling in her legs bilaterally.  The only other surgery she has had was on her left femur.    She states that she gets around with a walker.  She denies any pain in her feet at rest.  She states she does not get cramping in her calves.  She is diabetic and takes insulin.  She also has hypertension and takes a beta blocker, ARB, and lasix.  She takes a daily aspirin.  She is on a statin for cholesterol management.  She states that her mother died at age 60 with a heart attack.   She has never smoked or drank alcohol.    She is followed at [facility] for her non healing wound. (Dr. Dulce Sellar).   Past Medical History  Diagnosis Date  . HTN (hypertension)   . Diabetes mellitus without complication ([place])   . High cholesterol   . Spasm of back muscles   . Aortic stenosis     Past Surgical History  Procedure Laterality Date  . Intramedullary (im) nail intertrochanteric Left 02/04/2014    Procedure: INTRAMEDULLARY (IM) NAIL INTERTROCHANTRIC FEMORAL;  Surgeon: Mauri Pole, MD;  Location: [facility];  Service: Orthopedics;  Laterality: Left;  . Orif toe fracture Right 02/08/2014    Procedure: OPEN REDUCTION INTERNAL FIXATION Right METATARSAL  FRACTURE ;  Surgeon: Wylene Simmer, MD;  Location: [facility];  Service: Orthopedics;  Laterality: Right;    Allergies  Allergen Reactions  . Iodine Anaphylaxis  . Penicillins Anaphylaxis  . Shellfish Allergy Anaphylaxis  .  Sulfa Antibiotics Anaphylaxis    Current Outpatient Prescriptions  Medication Sig Dispense Refill  . acetaminophen (TYLENOL) 325 MG tablet Take 2 tablets (650 mg total) by mouth every 6 (six) hours as needed for mild pain.    [name] aspirin EC 81 MG tablet Take 325 mg by mouth daily.     [name] atorvastatin (LIPITOR) 20 MG tablet Take 20 mg by mouth at bedtime.    . carvedilol (COREG) 3.125 MG tablet Take 1 tablet (3.125 mg total) by mouth 2 (two) times daily with a meal.    . cetirizine (ZYRTEC) 10 MG tablet Take 10 mg by mouth daily as needed for allergies.    [name] docusate sodium (COLACE) 100 MG capsule Take 100 mg by mouth 2 (two) times daily.    . fluticasone (FLONASE) 50 MCG/ACT nasal spray Place 1 spray into both nostrils daily.    . furosemide (LASIX) 20 MG tablet Take 80 mg by mouth daily.    . hydrALAZINE (APRESOLINE) 25 MG tablet Take 1 tablet (25 mg total) by mouth 4 (four) times daily.    . insulin aspart (NOVOLOG) 100 UNIT/ML injection Inject 0-15 Units into the skin 3 (three) times daily with meals. Before each meal 3 times a day, 140-199 - 3 units, 200-250 -  5 units, 251-299 - 8 units,  300-349 - 12 units,  350 or above 15 units. 10 mL 11  . insulin detemir (LEVEMIR) 100 UNIT/ML injection Inject 35 Units into the skin 2 (two) times daily.    [name] levofloxacin (LEVAQUIN) 500 MG tablet Take 500 mg by mouth daily.    [name] losartan (COZAAR) 100 MG tablet Take 1 tablet (100 mg total) by mouth daily.    . NON FORMULARY K-Dur  20 meq   Once daily    . NON FORMULARY Milk of magnesia   prn    . omeprazole (PRILOSEC) 20 MG capsule Take 20 mg by mouth daily.    . polyethylene glycol (MIRALAX / GLYCOLAX) packet Take 17 g by mouth 2 (two) times daily. 14 each 0  . pregabalin (LYRICA) 50 MG capsule Take 50 mg by mouth 3 (three) times daily.    [name] gabapentin (NEURONTIN) 300 MG capsule Take 300 mg by mouth 3 (three) times daily. Reported on 12/08/2015    . HYDROcodone-acetaminophen (NORCO/VICODIN) 5-325 MG per  tablet Take one tablet by mouth every 4 hours as needed for pain (Patient not taking: Reported on 12/08/2015) 180 tablet 0  . hydrocortisone (ANUSOL-HC) 2.5 % rectal cream Apply 1 application topically 4 (four) times daily as needed. Reported on 12/08/2015    . hydrocortisone (ANUSOL-HC) 25 MG suppository Place 1 suppository (25 mg total) rectally 2 (two) times daily. (Patient not taking: Reported on 12/08/2015) 28 suppository 0   No current facility-administered medications for this visit.    Family History  Problem Relation Age of Onset  . Diabetes Other     Social History   Social History  . Marital Status: Married    Spouse Name: N/A  . Number of Children: N/A  . Years of Education: N/A   Occupational History  . Not on file.   Social History Main Topics  . Smoking status: Never Smoker   . Smokeless tobacco: Never Used     Comment: Never smoked  . Alcohol Use: No  . Drug Use: No  . Sexual Activity: No   Other Topics Concern  . Not on file   Social History Narrative     REVIEW OF SYSTEMS:   [X]  denotes positive finding, [ ]  denotes negative finding Cardiac  Comments:  Chest pain or chest pressure:    Shortness of breath upon exertion:    Short of breath when lying flat: x   Irregular heart rhythm:        Vascular    Pain in calf, thigh, or hip brought on by ambulation:    Pain in feet at night that wakes you up from your sleep:  x   Blood clot in your veins:    Leg swelling:  x       Pulmonary    Oxygen at home:    Productive cough:     Wheezing:         Neurologic    Sudden weakness in arms   x   Sudden numbness in arms   x   Sudden onset of difficulty speaking or slurred speech:    Temporary loss of vision in one eye:     Problems with dizziness:         Gastrointestinal    Blood in stool:  x Hx + hemoccult 2015  Vomited blood:         Genitourinary    Burning when urinating:     Blood in  urine:        Psychiatric    Major depression:           Hematologic    Bleeding problems:    Problems with blood clotting too easily:        Skin    Rashes or ulcers:        Constitutional    Fever or chills:      PHYSICAL EXAMINATION:  Filed Vitals:   12/08/15 1314  BP: 145/53  Pulse: 69  Temp: 97.5 F (36.4 C)  Resp: 14   Body mass index is 34.95 kg/(m^2).  General:  WDWN in NAD; vital signs documented above Gait: Not observed HENT: WNL, normocephalic Pulmonary: normal non-labored breathing , without Rales, rhonchi,  wheezing Cardiac: regular HR, without  Murmurs, rubs or gallops; without carotid bruits Abdomen: soft, NT, no masses Skin: without rashes Vascular Exam/Pulses:  Right Left  Radial 2+ (normal) 2+ (normal)  Ulnar Unable to palpate  Unable to palpate   Femoral Palpable, but difficult to assess due to body habitus  Palpable, but difficult to assess due to body habitus  DP Unable to palpate  Unable to palpate   PT Unable to palpate  Unable to palpate    Extremities:  Non healing wound dorsum of right foot proximal to right great toe measuing 3cm x 1cm; ~ 68mm depth.  No drainage present; minimal granulation tissue; does not appear infected.  + BLE edema Neurologic: A&O X 3;  No focal weakness or paresthesias are detected Psychiatric:  The pt has Normal affect. LYMPH:  No Cervical, Axillary, or Inguinal lymphadenopathy    Non-Invasive Vascular Imaging:   ABI's 12/08/15: Right:  0.59 Left:  0.38  -Digital waveform on the left dampened with Amp:  35mm -Digital waveform on the right dampened with Amp:  72mm  -DP/PT waveforms bilaterally are monophasic  Arterial duplex 12/08/15: -Unable to visualize popliteal, PTA, peroneal bilaterally  Previous ABI's at outside facility 06/06/14: Right:  0.56 Left:  0.41  Pt meds includes: Statin:  Yes.   Beta Blocker:  Yes.   Aspirin:  Yes.   ACEI:  No. ARB:  Yes.   Other Antiplatelet/Anticoagulant:  No.    ASSESSMENT/PLAN:: 75 y.o. female with a non healing  wound on the dorsum of the right foot for more than a year with decreased ABI's (atherosclerosis with ulceration, critical limb ischemia), DM  -pt is a diabetic with decreased ABI's and her digital waveform is severely dampened.  On the arterial duplex, the popliteal,posterior tibial, and peroneal are unable to be visualized.   -given the length of time the wound has been present and not healing, Dr. Bridgett Larsson has recommended an aortogram with bilateral lower extremity runoff with possible intervention, which will  Be scheduled on Dec 18, 2015.  If there is a lesion to intervene on, he will do at the time of the aortogram.  May possibly need surgical intervention, but that will be determined when the aortogram is complete.     Leontine Locket, PA-C [facility] [phone]  Clinic MD:  Pt seen and examined in conjunction with Dr. Bridgett Larsson  Addendum  I have independently interviewed and examined the patient, and I agree with the physician assistant's findings.  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  Pt will need Ao, BRo, possible right leg runoff.  This is scheduled 15 MAY 17.   I discussed with the patient  the nature of angiographic procedures, especially the limited patencies of any endovascular intervention.    The patient is aware of that the risks of an angiographic procedure include but are not limited to: bleeding, infection, access site complications, renal failure, embolization, rupture of vessel, dissection, arteriovenous fistula, possible need for emergent surgical intervention, possible need for surgical procedures to treat the patient's pathology, anaphylactic reaction to contrast, and stroke and death.    The patient is aware of the risks and agrees to proceed.    Adele Barthel, MD [facility] [place] Office: [phone] Pager:  [phone]  12/08/2015, 3:27 PM

## 2015-12-28 NOTE — Progress Notes (Signed)
Assumed care of pt from Allison Nishan, RN. Assessment documented. 

## 2015-12-29 ENCOUNTER — Telehealth: Payer: Self-pay | Admitting: Vascular Surgery

## 2015-12-29 NOTE — Telephone Encounter (Signed)
-----   Message from Mena Goes, RN sent at 12/28/2015  2:20 PM EDT ----- Regarding: schedule   ----- Message -----    From: Conrad Hilliard, MD    Sent: 12/28/2015  11:06 AM      To: [address]  [name] [id number] Jan 01, 1941  PROCEDURE: 1.  Left common femoral artery cannulation under ultrasound guidance 2.  Placement of catheter in aorta 3.  Aortogram 4.  Conscious sedation for 22 min 5.  Second order arterial selection 6.  Right leg runoff via catheter 7.  Left leg runoff via sheath  Follow-up: 2-4 weeks after Cardiology appointment  Needs: 1. ASAP cardiology evaluation for right fem-tib bypass 2. BLE GSV mapping

## 2015-12-29 NOTE — Telephone Encounter (Signed)
Sched cardiology appt with Dr. Johnsie Cancel in [place] on 6/7 at 8:45. Sched [facility] appt on 6/30; lab at 3 and MD at 4. Spoke to pt's son to inform them of appts.

## 2016-01-08 NOTE — Progress Notes (Signed)
No show

## 2016-01-10 ENCOUNTER — Encounter: Payer: Medicare HMO | Admitting: Cardiovascular Disease

## 2016-01-26 ENCOUNTER — Encounter: Payer: Self-pay | Admitting: Vascular Surgery

## 2016-01-31 NOTE — Progress Notes (Deleted)
Established Critical Limb Ischemia Patient  History of Present Illness  [name] is a 75 y.o. ([birth year]) female who presents with chief complaint: non-healing right dorsal foot wound.  She underwent angiography on 12/28/15 which demonstrated extensive bilateral tibial disease. Based on the angiogram, this patient needed: a R CFA/SFA to mid/distal AT bypass.  The patient was sent of preop cardiac evaluation given her prior history.    The patient has *** rest pain and wounds include: ***.  The patient notes symptoms have *** progressed.  The patient's treatment regimen currently included: maximal medical management and ***.  The cardiology work-up demonstrates: ***  The patient's PMH, PSH, SH, and FamHx are unchanged from ***.  Current Outpatient Prescriptions  Medication Sig Dispense Refill  . acetaminophen (TYLENOL) 325 MG tablet Take 2 tablets (650 mg total) by mouth every 6 (six) hours as needed for mild pain.    [name] acetaminophen (TYLENOL) 500 MG tablet Take 500 mg by mouth every 6 (six) hours as needed for mild pain.    [name] aspirin EC 81 MG tablet Take 81 mg by mouth daily.     . carvedilol (COREG) 3.125 MG tablet Take 1 tablet (3.125 mg total) by mouth 2 (two) times daily with a meal.    . cetirizine (ZYRTEC) 10 MG tablet Take 10 mg by mouth daily as needed for allergies.    [name] docusate sodium (COLACE) 100 MG capsule Take 100 mg by mouth 2 (two) times daily.    . fluticasone (FLONASE) 50 MCG/ACT nasal spray Place 1 spray into both nostrils daily.    . furosemide (LASIX) 20 MG tablet Take 80 mg by mouth 2 (two) times daily.     [name] HYDROcodone-acetaminophen (NORCO/VICODIN) 5-325 MG per tablet Take one tablet by mouth every 4 hours as needed for pain (Patient not taking: Reported on 12/08/2015) 180 tablet 0  . insulin aspart (NOVOLOG) 100 UNIT/ML injection Inject 0-15 Units into the skin 3 (three) times daily with meals. Before each meal 3 times a day, 140-199 - 3 units, 200-250 - 5  units, 251-299 - 8 units,  300-349 - 12 units,  350 or above 15 units. (Patient taking differently: Inject 4 Units into the skin 2 (two) times daily. ) 10 mL 11  . insulin detemir (LEVEMIR) 100 UNIT/ML injection Inject 25 Units into the skin 2 (two) times daily.     [name] losartan (COZAAR) 100 MG tablet Take 1 tablet (100 mg total) by mouth daily.    [name] omeprazole (PRILOSEC) 20 MG capsule Take 20 mg by mouth daily.    . polyethylene glycol (MIRALAX / GLYCOLAX) packet Take 17 g by mouth 2 (two) times daily. 14 each 0  . potassium chloride SA (K-DUR,KLOR-CON) 20 MEQ tablet Take 20 mEq by mouth daily.    . pregabalin (LYRICA) 50 MG capsule Take 50 mg by mouth 3 (three) times daily.     No current facility-administered medications for this visit.    Allergies  Allergen Reactions  . Iodine Anaphylaxis  . Penicillins Anaphylaxis  . Shellfish Allergy Anaphylaxis  . Sulfa Antibiotics Anaphylaxis    On ROS today: ***, ***  Physical Examination  There were no vitals filed for this visit. There is no weight on file to calculate BMI.  General: A&O x 3, WDWN  Eyes: PERRLA, EOMI  Pulmonary: Sym exp, good air movt, CTAB, no rales, rhonchi, & wheezing  Cardiac: RRR, Nl S1, S2, no rubs or gallops, +murmur  Vascular: Vessel  Right Left  Radial Palpable Palpable  Brachial Palpable Palpable  Carotid Palpable, without bruit Palpable, without bruit  Aorta Not palpable N/A  Femoral Palpable Palpable  Popliteal Not palpable Not palpable  PT Not Palpable Not Palpable  DP Not Palpable Not Palpable   Gastrointestinal: soft, NTND, no G/R, no HSM, no masses, no CVAT B  Musculoskeletal: M/S 5/5 throughout , Extremities without ischemic changes except  R foot wound: ***  Neurologic: ***Pain and light touch intact in extremities , Motor exam as listed above  BLE Vein Mapping (01/31/2016)  R: ***  L: ***  Medical Decision Making  [name] is a 75 y.o. female who presents with: R foot critical  limb ischemia with chronic non-healing dorsal ulcer.   Based on the patient's vascular studies and examination, I have offered the patient: ***.  R CFA  I discussed in depth with the patient the nature of atherosclerosis, and emphasized the importance of maximal medical management including strict control of blood pressure, blood glucose, and lipid levels, antiplatelet agents, obtaining regular exercise, and cessation of smoking.    The patient is aware that without maximal medical management the underlying atherosclerotic disease process will progress, limiting the benefit of any interventions. The patient is currently not on a statin: not medical indicated per patient.  Will recheck lipid profile at preop and start statin if indicated  The patient is currently on an anti-platelet: ASA.  Thank you for allowing [place] to participate in this patient's care.   Adele Barthel, MD, FACS [facility] [place] Office: [phone] Pager: [phone]

## 2016-02-02 ENCOUNTER — Ambulatory Visit (HOSPITAL_COMMUNITY): Payer: Medicare HMO

## 2016-02-02 ENCOUNTER — Encounter: Payer: Medicare HMO | Admitting: Vascular Surgery

## 2016-02-07 ENCOUNTER — Ambulatory Visit (HOSPITAL_COMMUNITY)
Admission: RE | Admit: 2016-02-07 | Discharge: 2016-02-07 | Disposition: A | Discharge status: Home / Self Care | Payer: Medicare HMO | Source: Ambulatory Visit | Attending: Vascular Surgery | Admitting: Vascular Surgery

## 2016-02-07 DIAGNOSIS — E119 Type 2 diabetes mellitus without complications: Secondary | ICD-10-CM | POA: Diagnosis not present

## 2016-02-07 DIAGNOSIS — Z01810 Encounter for preprocedural cardiovascular examination: Secondary | ICD-10-CM | POA: Diagnosis not present

## 2016-02-07 DIAGNOSIS — E7800 Pure hypercholesterolemia, unspecified: Secondary | ICD-10-CM | POA: Diagnosis not present

## 2016-02-07 DIAGNOSIS — I10 Essential (primary) hypertension: Secondary | ICD-10-CM | POA: Diagnosis not present

## 2016-02-07 DIAGNOSIS — I739 Peripheral vascular disease, unspecified: Secondary | ICD-10-CM | POA: Diagnosis not present

## 2016-02-07 DIAGNOSIS — R600 Localized edema: Secondary | ICD-10-CM | POA: Insufficient documentation

## 2016-02-11 NOTE — Progress Notes (Signed)
This encounter was created in error - please disregard.

## 2016-02-13 ENCOUNTER — Encounter: Payer: Self-pay | Admitting: Vascular Surgery

## 2016-02-16 ENCOUNTER — Ambulatory Visit (INDEPENDENT_AMBULATORY_CARE_PROVIDER_SITE_OTHER): Payer: Medicare HMO | Admitting: Vascular Surgery

## 2016-02-16 ENCOUNTER — Encounter: Payer: Self-pay | Admitting: Vascular Surgery

## 2016-02-16 VITALS — BP 155/69 | HR 60 | Ht 65.0 in | Wt 210.0 lb

## 2016-02-16 DIAGNOSIS — I7025 Atherosclerosis of native arteries of other extremities with ulceration: Secondary | ICD-10-CM

## 2016-02-16 NOTE — Progress Notes (Signed)
Established Critical Limb Ischemia Patient  History of Present Illness  [name] is a 75 y.o. (1940-11-20) female who presents with chief complaint: continued poor healing of right foot.  The patient has not rest pain and wounds include: R dorsal wound overlying 1st MT.  The patient notes symptoms have not progressed but her wound appears to be enlarging.  The patient's treatment regimen currently included: maximal medical management and wound care.  She underwent angiography on 12/28/15: bilateral extensive tibial disease.  Based on the images, her only reconstruction option was a R pop to mid-peroneal bypass with vein.  She was sent to Cardiology for further evaluation.  Nuclear stress testing appears to be low risk.  She has follow up with Cardiology on 24 JUL 17.  Past Medical History  Diagnosis Date  . HTN (hypertension)   . Diabetes mellitus without complication ([place])   . High cholesterol   . Spasm of back muscles   . Aortic stenosis     Past Surgical History  Procedure Laterality Date  . Intramedullary (im) nail intertrochanteric Left 02/04/2014    Procedure: INTRAMEDULLARY (IM) NAIL INTERTROCHANTRIC FEMORAL;  Surgeon: Mauri Pole, MD;  Location: [place];  Service: Orthopedics;  Laterality: Left;  . Orif toe fracture Right 02/08/2014    Procedure: OPEN REDUCTION INTERNAL FIXATION Right METATARSAL  FRACTURE ;  Surgeon: Wylene Simmer, MD;  Location: [place];  Service: Orthopedics;  Laterality: Right;  . Peripheral vascular catheterization N/A 12/28/2015    Procedure: Abdominal Aortogram w/Lower Extremity;  Surgeon: Conrad Santee, MD;  Location: [facility];  Service: Cardiovascular;  Laterality: N/A;    Social History   Social History  . Marital Status: Married    Spouse Name: N/A  . Number of Children: N/A  . Years of Education: N/A   Occupational History  . Not on file.   Social History Main Topics  . Smoking status: Never Smoker   . Smokeless tobacco: Never Used       Comment: Never smoked  . Alcohol Use: No  . Drug Use: No  . Sexual Activity: No   Other Topics Concern  . Not on file   Social History Narrative    Family History  Problem Relation Age of Onset  . Diabetes Other     Current Outpatient Prescriptions  Medication Sig Dispense Refill  . acetaminophen (TYLENOL) 325 MG tablet Take 2 tablets (650 mg total) by mouth every 6 (six) hours as needed for mild pain.    [name] acetaminophen (TYLENOL) 500 MG tablet Take 500 mg by mouth every 6 (six) hours as needed for mild pain.    [name] aspirin EC 81 MG tablet Take 81 mg by mouth daily.     . carvedilol (COREG) 3.125 MG tablet Take 1 tablet (3.125 mg total) by mouth 2 (two) times daily with a meal.    . cetirizine (ZYRTEC) 10 MG tablet Take 10 mg by mouth daily as needed for allergies.    [name] docusate sodium (COLACE) 100 MG capsule Take 100 mg by mouth 2 (two) times daily.    . fluticasone (FLONASE) 50 MCG/ACT nasal spray Place 1 spray into both nostrils daily.    . furosemide (LASIX) 20 MG tablet Take 80 mg by mouth 2 (two) times daily.     . insulin aspart (NOVOLOG) 100 UNIT/ML injection Inject 0-15 Units into the skin 3 (three) times daily with meals. Before each meal 3 times a day, 140-199 -  3 units, 200-250 - 5 units, 251-299 - 8 units,  300-349 - 12 units,  350 or above 15 units. (Patient taking differently: Inject 4 Units into the skin 2 (two) times daily. ) 10 mL 11  . insulin detemir (LEVEMIR) 100 UNIT/ML injection Inject 25 Units into the skin 2 (two) times daily.     [name] losartan (COZAAR) 100 MG tablet Take 1 tablet (100 mg total) by mouth daily.    [name] omeprazole (PRILOSEC) 20 MG capsule Take 20 mg by mouth daily.    . polyethylene glycol (MIRALAX / GLYCOLAX) packet Take 17 g by mouth 2 (two) times daily. 14 each 0  . potassium chloride SA (K-DUR,KLOR-CON) 20 MEQ tablet Take 20 mEq by mouth daily.    . pregabalin (LYRICA) 50 MG capsule Take 50 mg by mouth 3 (three) times daily.    [name] atorvastatin  (LIPITOR) 20 MG tablet     . B-D INS SYRINGE 0.5CC/31GX5/16 31G X 5/16" 0.5 ML MISC     . carvedilol (COREG) 12.5 MG tablet     . carvedilol (COREG) 25 MG tablet     . furosemide (LASIX) 40 MG tablet     . HYDROcodone-acetaminophen (NORCO/VICODIN) 5-325 MG per tablet Take one tablet by mouth every 4 hours as needed for pain (Patient not taking: Reported on 12/08/2015) 180 tablet 0   No current facility-administered medications for this visit.     Allergies  Allergen Reactions  . Iodine Anaphylaxis  . Penicillins Anaphylaxis  . Shellfish Allergy Anaphylaxis  . Sulfa Antibiotics Anaphylaxis    REVIEW OF SYSTEMS:   [X]  denotes positive finding, [ ]  denotes negative finding Cardiac  Comments:  Chest pain or chest pressure:    Shortness of breath upon exertion:    Short of breath when lying flat: x   Irregular heart rhythm:        Vascular    Pain in calf, thigh, or hip brought on by ambulation:    Pain in feet at night that wakes you up from your sleep:  x   Blood clot in your veins:    Leg swelling:  x       Pulmonary    Oxygen at home:    Productive cough:     Wheezing:         Neurologic    Sudden weakness in arms  x   Sudden numbness in arms  x   Sudden onset of difficulty speaking or slurred speech:    Temporary loss of vision in one eye:     Problems with dizziness:         Gastrointestinal    Blood in stool:  x Hx + hemoccult 2015  Vomited blood:         Genitourinary    Burning when urinating:     Blood in urine:        Psychiatric    Major depression:         Hematologic    Bleeding problems:    Problems with blood clotting too easily:        Skin    Rashes or ulcers:        Constitutional    Fever or chills:          Physical Examination  Filed Vitals:   02/16/16 1209 02/16/16 1216  BP: 159/68 155/69  Pulse:  60   Height: 5\' 5"  (1.651 m)   Weight: 210 lb (95.255 kg)   SpO2: 96%  Body mass index is 34.95 kg/(m^2).  General: A&O x 3, WD, mildly obese  Eyes: PERRLA, EOMI  Pulmonary: Sym exp, good air movt, CTAB, no rales, rhonchi, & wheezing  Cardiac: RRR, Nl S1, S2, no Murmurs, rubs or gallops  Vascular: Vessel Right Left  Radial Palpable Palpable  Brachial Palpable Palpable  Carotid Palpable, without bruit Palpable, without bruit  Aorta Not palpable N/A  Femoral Palpable Palpable  Popliteal Not palpable Not palpable  PT Not Palpable Not Palpable  DP Not Palpable Not Palpable   Gastrointestinal: soft, NTND, no G/R, no HSM, no masses, no CVAT B  Musculoskeletal: M/S 5/5 throughout , Extremities without ischemic changes except wound overlying L 1st MT: good granulation, appears bigger than previously  Neurologic: Pain and light touch intact in extremities except decreased sensation in feet and hands, Motor exam as listed above   Medical Decision Making  [name] is a 75 y.o. female who presents with: critical limb ischemia with R foot ulcer   Based on the patient's vascular studies and examination, I have offered the patient: R pop to peroneal bypass.  She is schedule for the 26 JUL 17. The risk, benefits, and alternative for bypass operations were discussed with the patient.   The patient is aware the risks include but are not limited to: bleeding, infection, myocardial infarction, stroke, limb loss, nerve damage, need for additional procedures in the future, wound complications, and inability to complete the bypass.  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   However, without the bypass, her wound is enlarging. She does not want to consider amputation, so at this point, would recommend proceeding with the bypass The patient is aware of these risks and  agreed to proceed.  I discussed in depth with the patient the nature of atherosclerosis, and emphasized the importance of maximal medical management including strict control of blood pressure, blood glucose, and lipid levels, antiplatelet agents, obtaining regular exercise, and cessation of smoking.    The patient is aware that without maximal medical management the underlying atherosclerotic disease process will progress, limiting the benefit of any interventions. The patient is currently on a statin: Lipitor. The patient is currently on an anti-platelet: ASA/  Thank you for allowing [place] to participate in this patient's care.   Adele Barthel, MD, FACS [facility] Office: [phone] Pager: [phone]

## 2016-02-20 ENCOUNTER — Other Ambulatory Visit: Payer: Self-pay

## 2016-02-21 ENCOUNTER — Encounter: Payer: Self-pay | Admitting: Vascular Surgery

## 2016-02-21 ENCOUNTER — Encounter: Payer: Self-pay | Admitting: Internal Medicine

## 2016-02-26 ENCOUNTER — Encounter (HOSPITAL_COMMUNITY): Payer: Self-pay

## 2016-02-26 ENCOUNTER — Encounter (HOSPITAL_COMMUNITY)
Admission: RE | Admit: 2016-02-26 | Discharge: 2016-02-26 | Disposition: A | Discharge status: Home / Self Care | Payer: Medicare HMO | Source: Ambulatory Visit | Attending: Vascular Surgery | Admitting: Vascular Surgery

## 2016-02-26 HISTORY — DX: Adverse effect of unspecified anesthetic, initial encounter: T41.45XA

## 2016-02-26 HISTORY — DX: Other complications of anesthesia, initial encounter: T88.59XA

## 2016-02-26 HISTORY — DX: Type 2 diabetes mellitus with diabetic neuropathy, unspecified: E11.40

## 2016-02-26 HISTORY — DX: Other injury of unspecified body region, initial encounter: T14.8XXA

## 2016-02-26 HISTORY — DX: Essential (primary) hypertension: I10

## 2016-02-26 HISTORY — DX: Gastro-esophageal reflux disease without esophagitis: K21.9

## 2016-02-26 HISTORY — DX: Localized edema: R60.0

## 2016-02-26 HISTORY — DX: Reserved for inherently not codable concepts without codable children: IMO0001

## 2016-02-26 LAB — COMPREHENSIVE METABOLIC PANEL
ALK PHOS: 61 U/L (ref 38–126)
ALT: 38 U/L (ref 14–54)
ANION GAP: 7 (ref 5–15)
AST: 28 U/L (ref 15–41)
Albumin: 3.5 g/dL (ref 3.5–5.0)
BUN: 20 mg/dL (ref 6–20)
CHLORIDE: 106 mmol/L (ref 101–111)
CO2: 27 mmol/L (ref 22–32)
Calcium: 9.7 mg/dL (ref 8.9–10.3)
Creatinine, Ser: 0.84 mg/dL (ref 0.44–1.00)
GFR calc non Af Amer: >60 mL/min (ref >60)
Glucose, Bld: 160 mg/dL — ABNORMAL HIGH (ref 65–99)
Potassium: 4.4 mmol/L (ref 3.5–5.1)
SODIUM: 140 mmol/L (ref 135–145)
Total Bilirubin: 0.5 mg/dL (ref 0.3–1.2)
Total Protein: 6.8 g/dL (ref 6.5–8.1)

## 2016-02-26 LAB — CBC
HCT: 39.6 % (ref 36.0–46.0)
Hemoglobin: 12.2 g/dL (ref 12.0–15.0)
MCH: 26.6 pg (ref 26.0–34.0)
MCHC: 30.8 g/dL (ref 30.0–36.0)
MCV: 86.5 fL (ref 78.0–100.0)
PLATELETS: 188 10*3/uL (ref 150–400)
RBC: 4.58 MIL/uL (ref 3.87–5.11)
RDW: 15.8 % — ABNORMAL HIGH (ref 11.5–15.5)
WBC: 9 10*3/uL (ref 4.0–10.5)

## 2016-02-26 LAB — GLUCOSE, CAPILLARY: Glucose-Capillary: 213 mg/dL — ABNORMAL HIGH (ref 65–99)

## 2016-02-26 LAB — PROTIME-INR
INR: 1.2 (ref 0.00–1.49)
PROTHROMBIN TIME: 15.4 s — AB (ref 11.6–15.2)

## 2016-02-26 LAB — APTT: aPTT: 31 seconds (ref 24–37)

## 2016-02-26 LAB — SURGICAL PCR SCREEN
MRSA, PCR: NEGATIVE
STAPHYLOCOCCUS AUREUS: NEGATIVE

## 2016-02-26 MED ORDER — CHLORHEXIDINE GLUCONATE CLOTH 2 % EX PADS: 6.0000 | MEDICATED_PAD | Freq: Once | CUTANEOUS | Status: DC

## 2016-02-26 NOTE — Pre-Procedure Instructions (Signed)
[name]  02/26/2016     Your procedure is scheduled on : Wednesday February 28, 2016 at 8:30 AM.  Report to [facility] Admitting at 6:30 AM.  Call this number if you have problems the morning of surgery: [phone]    Remember:  Do not eat food or drink liquids after midnight.  Take these medicines the morning of surgery with A SIP OF WATER : Albuterol inhaler (Bring inhaler with you), Acetaminophen (Tylenol) if needed, Carvedilol (Coreg), Cetirizine (Zyrtec) if needed, Flonase nasal spray, Omeprazole (Prilosec), Pregabalin (Lyrica)   Stop taking any vitamins, herbal medications/supplements, NSAIDs, Ibuprofen, Advil, Motrin, Aleve, etc today     How to Manage Your Diabetes Before and After Surgery  Why is it important to control my blood sugar before and after surgery? . Improving blood sugar levels before and after surgery helps healing and can limit problems. . A way of improving blood sugar control is eating a healthy diet by: o  Eating less sugar and carbohydrates o  Increasing activity/exercise o  Talking with your doctor about reaching your blood sugar goals . High blood sugars (greater than 180 mg/dL) can raise your risk of infections and slow your recovery, so you will need to focus on controlling your diabetes during the weeks before surgery. . Make sure that the doctor who takes care of your diabetes knows about your planned surgery including the date and location.  How do I manage my blood sugar before surgery? . Check your blood sugar at least 4 times a day, starting 2 days before surgery, to make sure that the level is not too high or low. o Check your blood sugar the morning of your surgery when you wake up and every 2 hours until you get to the Short Stay unit. . If your blood sugar is less than 70 mg/dL, you will need to treat for low blood sugar: o Do not take insulin. o Treat a low blood sugar (less than 70 mg/dL) with  cup of clear juice (cranberry  or apple), 4 glucose tablets, OR glucose gel. o Recheck blood sugar in 15 minutes after treatment (to make sure it is greater than 70 mg/dL). If your blood sugar is not greater than 70 mg/dL on recheck, call [phone] for further instructions. . Report your blood sugar to the short stay nurse when you get to Short Stay.  . If you are admitted to the hospital after surgery: o Your blood sugar will be checked by the staff and you will probably be given insulin after surgery (instead of oral diabetes medicines) to make sure you have good blood sugar levels. o The goal for blood sugar control after surgery is 80-180 mg/dL.      WHAT DO I DO ABOUT MY DIABETES MEDICATION?   [name] Do not take oral diabetes medicines (pills) the morning of surgery.  . THE NIGHT BEFORE SURGERY, take 12 units of Levemir insulin.       . THE MORNING OF SURGERY, take 12 units of Levemir insulin.  . If your CBG is greater than 220 mg/dL, you may take  of your sliding scale (correction) dose of insulin. (If your blood glucose if greater than 220 only take 2 units of Novolog insulin the morning of your surgery. If not greater than 220, do NOT take any Novolog the day of surgery)   Reviewed and Endorsed by [facility] Patient Education Committee, August 2015     Do not wear jewelry,  make-up or nail polish.  Do not wear lotions, powders, or perfumes.    Do not shave 48 hours prior to surgery.    Do not bring valuables to the hospital.  [facility] is not responsible for any belongings or valuables.  Contacts, dentures or bridgework may not be worn into surgery.  Leave your suitcase in the car.  After surgery it may be brought to your room.  For patients admitted to the hospital, discharge time will be determined by your treatment team.  Patients discharged the day of surgery will not be allowed to drive home.   Name and phone number of your driver:    Special instructions:  Shower using CHG soap the night  before and the morning of your surgery  Please read over the following fact sheets that you were given. Blood Transfusion Information and MRSA Information

## 2016-02-26 NOTE — Progress Notes (Signed)
   02/26/16 1033  OBSTRUCTIVE SLEEP APNEA  Have you ever been diagnosed with sleep apnea through a sleep study? No  Do you snore loudly (loud enough to be heard through closed doors)?  0  Do you often feel tired, fatigued, or sleepy during the daytime (such as falling asleep during driving or talking to someone)? 1  Has anyone observed you stop breathing during your sleep? 0  Do you have, or are you being treated for high blood pressure? 1  BMI more than 35 kg/m2? 1  Age > 50 (1-yes) 1  Neck circumference greater than:Female 16 inches or larger, Female 17inches or larger? 1  Female Gender (Yes=1) 0  Obstructive Sleep Apnea Score 5  Score 5 or greater  Results sent to PCP   This patient has screened at risk for sleep apnea using the STOP Bang tool used during a pre-surgical visit. A score of 5 or greater is at risk for sleep apnea.

## 2016-02-26 NOTE — Progress Notes (Signed)
Patient arrived to PAT via wheelchair accompanied by her husband [name]  Patient denied having any acute cardiac issues, but informed Nurse that she used her Albuterol inhaler today before coming to PAT.  PCP is Delight Stare  Cardiologist is Dr. Nehemiah Massed at [facility] in [place]. LOV was "almost a month ago." Patient stated she is scheduled to go back on the 31st of this month.   Nurse inquired about CBG and patients husband informed Nurse that blood glucose typically ranges from 62 to 250. CBG on arrival to PAT was 213, and patient informed Nurse that she consumed some "cereal and drank some water".  Patient requested to have a top hat to urinate in as a urine specimen was ordered by Dr. Bridgett Larsson. Nurse placed top hat on commode, and shortly thereafter patients husband informed Nurse that patient missed the top hop, and urinated in the commode. Therefore a urinalysis will be collected STAT DOS.   Will send chart to Anesthesia for review.

## 2016-02-27 ENCOUNTER — Inpatient Hospital Stay (HOSPITAL_COMMUNITY): Admission: RE | Admit: 2016-02-27 | Payer: Medicare HMO | Source: Ambulatory Visit

## 2016-02-27 LAB — HEMOGLOBIN A1C
Hgb A1c MFr Bld: 8.3 % — ABNORMAL HIGH (ref 4.8–5.6)
Mean Plasma Glucose: 192 mg/dL

## 2016-02-27 MED ORDER — VANCOMYCIN HCL 10 G IV SOLR
1500.0000 mg | INTRAVENOUS | Status: AC
  Administered 2016-02-28: 1500 mg via INTRAVENOUS
  Filled 2016-02-27: qty 1500

## 2016-02-27 MED ORDER — SODIUM CHLORIDE 0.9 % IV SOLN: INTRAVENOUS | Status: DC

## 2016-02-27 NOTE — Progress Notes (Addendum)
Anesthesia Chart Review: Patient is a 75 year old female scheduled for right popliteal to peroneal artery bypass on 02/28/16 by Dr. Bridgett Larsson.  History includes HTN, DM2, hypercholesterolemia, AS (mild), PVD, GERD, peripheral edema, non-smoker. BMI is consistent with morbid obesity. OSA screening score was 5. PCP was reported as Dr. Delight Stare. Cardiologist is Dr. Nehemiah Massed with [facility], last visit 01/31/16. He ordered a stress and echo (see below) and based on results felt she could proceed.  Meds include albuterol, ASA, Lipitor, Coreg, Flonase, Lasix, Novolog, Levemir, losartan, Prilosec, KCl, Lyrica.   12/28/15 EKG: NSR, septal infarct (age undetermined).  01/31/16 Nuclear stress test [facility], Care Everywhere): Indeterminate Lexiscan infusion EKG Normal LV systolic function Inferior myocardial perfusion changes although motion artifact reduces the  potential for interpretation. FINDINGS: Regional wall motion:reveals normal myocardial thickening and wall  Motion. LVEF 64%. The overall quality of the study is poor. Artifacts noted: yes Left ventricular cavity: normal. Perfusion Analysis:SPECT images demonstrate homogeneous tracer  distribution throughout the myocardium.There was significant artifact  from the apical wall and there was some motion artifact of the inferior  wall and cannot rule out the possibility of minimal myocardial ischemia  lower risk.Resting images were normal  01/31/16 Echo ([facility]; Care Everywhere): INTERPRETATION NORMAL LEFT VENTRICULAR SYSTOLIC FUNCTION WITH MILD LVH NORMAL RIGHT VENTRICULAR SYSTOLIC FUNCTION TRIVIAL REGURGITATION NOTED (See above): Trivial MR/TR/PR MILD VALVULAR STENOSIS (See above): Mild AS. 9.0 mmHg mean gradient, 19.0 mmHg peak gradient, 218.0 cm/sec peak velocity.  SMALL PERICARDIAL EFFUSION (See above) POOR SOUND TRANSMISSION  Preoperative labs noted. A1c 8.3. Collection of UA specimen was unsuccessful--to do DOS.   If  no acute changes then I anticipate that she can proceed as planned.  George Hugh [facility] Short Stay Center/Anesthesiology Phone [phone] 02/27/2016 12:26 PM

## 2016-02-27 NOTE — Anesthesia Preprocedure Evaluation (Signed)
Anesthesia Evaluation  Patient identified by MRN, date of birth, ID band Patient awake    Reviewed: Allergy & Precautions, NPO status , Patient's Chart, lab work & pertinent test results  History of Anesthesia Complications Negative for: history of anesthetic complications  Airway Mallampati: I  TM Distance: >3 FB Neck ROM: Full    Dental  (+) Poor Dentition, Missing, Chipped, Dental Advisory Given   Pulmonary COPD,  COPD inhaler,    breath sounds clear to auscultation       Cardiovascular hypertension, Pt. on medications and Pt. on home beta blockers + Peripheral Vascular Disease  + Valvular Problems/Murmurs (mild-mod AS) AS  Rhythm:Regular Rate:Normal  6/17 ECHO: EF >55%, mild AS 6/17 ECHO:  EF 64%, no ischemia   Neuro/Psych CVA (L weakness, walks with cane), Residual Symptoms    GI/Hepatic Neg liver ROS, GERD  Medicated and Controlled,  Endo/Other  diabetes (glu 84), Insulin DependentMorbid obesity  Renal/GU      Musculoskeletal   Abdominal (+) + obese,   Peds  Hematology   Anesthesia Other Findings   Reproductive/Obstetrics                            Anesthesia Physical Anesthesia Plan  ASA: III  Anesthesia Plan: General   Post-op Pain Management:    Induction: Intravenous  Airway Management Planned: Oral ETT  Additional Equipment:   Intra-op Plan:   Post-operative Plan: Extubation in OR  Informed Consent: I have reviewed the patients History and Physical, chart, labs and discussed the procedure including the risks, benefits and alternatives for the proposed anesthesia with the patient or authorized representative who has indicated his/her understanding and acceptance.   Dental advisory given  Plan Discussed with: Surgeon and CRNA  Anesthesia Plan Comments: (Plan routine monitors, GETA)        Anesthesia Quick Evaluation

## 2016-02-28 ENCOUNTER — Inpatient Hospital Stay (HOSPITAL_COMMUNITY)
Admission: RE | Admit: 2016-02-28 | Discharge: 2016-03-13 | DRG: 252 | Disposition: A | Discharge status: Skilled Nursing Facility | Payer: Medicare HMO | Source: Ambulatory Visit | Attending: Vascular Surgery | Admitting: Vascular Surgery

## 2016-02-28 ENCOUNTER — Inpatient Hospital Stay (HOSPITAL_COMMUNITY): Payer: Medicare HMO | Admitting: Anesthesiology

## 2016-02-28 ENCOUNTER — Inpatient Hospital Stay (HOSPITAL_COMMUNITY): Payer: Medicare HMO

## 2016-02-28 ENCOUNTER — Encounter (HOSPITAL_COMMUNITY)
Admission: RE | Disposition: A | Discharge status: Skilled Nursing Facility | Payer: Self-pay | Source: Ambulatory Visit | Attending: Neurology

## 2016-02-28 ENCOUNTER — Inpatient Hospital Stay (HOSPITAL_COMMUNITY): Payer: Medicare HMO | Admitting: Vascular Surgery

## 2016-02-28 ENCOUNTER — Encounter (HOSPITAL_COMMUNITY): Payer: Self-pay | Admitting: Anesthesiology

## 2016-02-28 DIAGNOSIS — M79609 Pain in unspecified limb: Secondary | ICD-10-CM | POA: Diagnosis not present

## 2016-02-28 DIAGNOSIS — E875 Hyperkalemia: Secondary | ICD-10-CM | POA: Diagnosis not present

## 2016-02-28 DIAGNOSIS — I6340 Cerebral infarction due to embolism of unspecified cerebral artery: Secondary | ICD-10-CM | POA: Diagnosis not present

## 2016-02-28 DIAGNOSIS — E11621 Type 2 diabetes mellitus with foot ulcer: Secondary | ICD-10-CM | POA: Diagnosis present

## 2016-02-28 DIAGNOSIS — E876 Hypokalemia: Secondary | ICD-10-CM | POA: Diagnosis present

## 2016-02-28 DIAGNOSIS — Z881 Allergy status to other antibiotic agents status: Secondary | ICD-10-CM

## 2016-02-28 DIAGNOSIS — E872 Acidosis: Secondary | ICD-10-CM | POA: Diagnosis not present

## 2016-02-28 DIAGNOSIS — E1159 Type 2 diabetes mellitus with other circulatory complications: Secondary | ICD-10-CM | POA: Diagnosis not present

## 2016-02-28 DIAGNOSIS — D649 Anemia, unspecified: Secondary | ICD-10-CM

## 2016-02-28 DIAGNOSIS — K219 Gastro-esophageal reflux disease without esophagitis: Secondary | ICD-10-CM | POA: Diagnosis present

## 2016-02-28 DIAGNOSIS — E785 Hyperlipidemia, unspecified: Secondary | ICD-10-CM | POA: Diagnosis present

## 2016-02-28 DIAGNOSIS — E1140 Type 2 diabetes mellitus with diabetic neuropathy, unspecified: Secondary | ICD-10-CM | POA: Diagnosis present

## 2016-02-28 DIAGNOSIS — J9601 Acute respiratory failure with hypoxia: Secondary | ICD-10-CM | POA: Diagnosis not present

## 2016-02-28 DIAGNOSIS — I509 Heart failure, unspecified: Secondary | ICD-10-CM

## 2016-02-28 DIAGNOSIS — D696 Thrombocytopenia, unspecified: Secondary | ICD-10-CM | POA: Diagnosis not present

## 2016-02-28 DIAGNOSIS — I63032 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: Secondary | ICD-10-CM | POA: Diagnosis present

## 2016-02-28 DIAGNOSIS — M6289 Other specified disorders of muscle: Secondary | ICD-10-CM | POA: Diagnosis not present

## 2016-02-28 DIAGNOSIS — G8191 Hemiplegia, unspecified affecting right dominant side: Secondary | ICD-10-CM | POA: Diagnosis not present

## 2016-02-28 DIAGNOSIS — R739 Hyperglycemia, unspecified: Secondary | ICD-10-CM | POA: Diagnosis not present

## 2016-02-28 DIAGNOSIS — L97519 Non-pressure chronic ulcer of other part of right foot with unspecified severity: Secondary | ICD-10-CM

## 2016-02-28 DIAGNOSIS — N170 Acute kidney failure with tubular necrosis: Secondary | ICD-10-CM | POA: Diagnosis not present

## 2016-02-28 DIAGNOSIS — M7989 Other specified soft tissue disorders: Secondary | ICD-10-CM | POA: Diagnosis not present

## 2016-02-28 DIAGNOSIS — D62 Acute posthemorrhagic anemia: Secondary | ICD-10-CM | POA: Diagnosis not present

## 2016-02-28 DIAGNOSIS — J9600 Acute respiratory failure, unspecified whether with hypoxia or hypercapnia: Secondary | ICD-10-CM

## 2016-02-28 DIAGNOSIS — I110 Hypertensive heart disease with heart failure: Secondary | ICD-10-CM | POA: Diagnosis present

## 2016-02-28 DIAGNOSIS — R748 Abnormal levels of other serum enzymes: Secondary | ICD-10-CM | POA: Diagnosis not present

## 2016-02-28 DIAGNOSIS — E7800 Pure hypercholesterolemia, unspecified: Secondary | ICD-10-CM | POA: Diagnosis present

## 2016-02-28 DIAGNOSIS — Z794 Long term (current) use of insulin: Secondary | ICD-10-CM

## 2016-02-28 DIAGNOSIS — I6300 Cerebral infarction due to thrombosis of unspecified precerebral artery: Secondary | ICD-10-CM | POA: Diagnosis not present

## 2016-02-28 DIAGNOSIS — R062 Wheezing: Secondary | ICD-10-CM

## 2016-02-28 DIAGNOSIS — E1165 Type 2 diabetes mellitus with hyperglycemia: Secondary | ICD-10-CM | POA: Diagnosis not present

## 2016-02-28 DIAGNOSIS — D72829 Elevated white blood cell count, unspecified: Secondary | ICD-10-CM | POA: Diagnosis not present

## 2016-02-28 DIAGNOSIS — I350 Nonrheumatic aortic (valve) stenosis: Secondary | ICD-10-CM | POA: Diagnosis present

## 2016-02-28 DIAGNOSIS — D509 Iron deficiency anemia, unspecified: Secondary | ICD-10-CM | POA: Diagnosis not present

## 2016-02-28 DIAGNOSIS — I639 Cerebral infarction, unspecified: Secondary | ICD-10-CM

## 2016-02-28 DIAGNOSIS — Z7982 Long term (current) use of aspirin: Secondary | ICD-10-CM

## 2016-02-28 DIAGNOSIS — R531 Weakness: Secondary | ICD-10-CM

## 2016-02-28 DIAGNOSIS — Z419 Encounter for procedure for purposes other than remedying health state, unspecified: Secondary | ICD-10-CM | POA: Diagnosis not present

## 2016-02-28 DIAGNOSIS — Z79899 Other long term (current) drug therapy: Secondary | ICD-10-CM

## 2016-02-28 DIAGNOSIS — Z6841 Body Mass Index (BMI) 40.0 and over, adult: Secondary | ICD-10-CM

## 2016-02-28 DIAGNOSIS — E1151 Type 2 diabetes mellitus with diabetic peripheral angiopathy without gangrene: Principal | ICD-10-CM | POA: Diagnosis present

## 2016-02-28 DIAGNOSIS — I63413 Cerebral infarction due to embolism of bilateral middle cerebral arteries: Secondary | ICD-10-CM | POA: Diagnosis not present

## 2016-02-28 DIAGNOSIS — E870 Hyperosmolality and hypernatremia: Secondary | ICD-10-CM | POA: Diagnosis not present

## 2016-02-28 DIAGNOSIS — I6789 Other cerebrovascular disease: Secondary | ICD-10-CM | POA: Diagnosis not present

## 2016-02-28 DIAGNOSIS — R4701 Aphasia: Secondary | ICD-10-CM | POA: Diagnosis not present

## 2016-02-28 DIAGNOSIS — R0600 Dyspnea, unspecified: Secondary | ICD-10-CM | POA: Diagnosis not present

## 2016-02-28 DIAGNOSIS — R1310 Dysphagia, unspecified: Secondary | ICD-10-CM | POA: Diagnosis not present

## 2016-02-28 DIAGNOSIS — Q251 Coarctation of aorta: Secondary | ICD-10-CM

## 2016-02-28 DIAGNOSIS — I739 Peripheral vascular disease, unspecified: Secondary | ICD-10-CM | POA: Diagnosis present

## 2016-02-28 DIAGNOSIS — Z993 Dependence on wheelchair: Secondary | ICD-10-CM

## 2016-02-28 DIAGNOSIS — I671 Cerebral aneurysm, nonruptured: Secondary | ICD-10-CM

## 2016-02-28 DIAGNOSIS — Z91013 Allergy to seafood: Secondary | ICD-10-CM

## 2016-02-28 DIAGNOSIS — J90 Pleural effusion, not elsewhere classified: Secondary | ICD-10-CM

## 2016-02-28 DIAGNOSIS — I5020 Unspecified systolic (congestive) heart failure: Secondary | ICD-10-CM | POA: Diagnosis present

## 2016-02-28 DIAGNOSIS — I5032 Chronic diastolic (congestive) heart failure: Secondary | ICD-10-CM

## 2016-02-28 DIAGNOSIS — Z880 Allergy status to penicillin: Secondary | ICD-10-CM

## 2016-02-28 DIAGNOSIS — R0989 Other specified symptoms and signs involving the circulatory and respiratory systems: Secondary | ICD-10-CM

## 2016-02-28 DIAGNOSIS — E119 Type 2 diabetes mellitus without complications: Secondary | ICD-10-CM | POA: Diagnosis not present

## 2016-02-28 DIAGNOSIS — Z833 Family history of diabetes mellitus: Secondary | ICD-10-CM

## 2016-02-28 DIAGNOSIS — J9602 Acute respiratory failure with hypercapnia: Secondary | ICD-10-CM

## 2016-02-28 DIAGNOSIS — Z91048 Other nonmedicinal substance allergy status: Secondary | ICD-10-CM

## 2016-02-28 DIAGNOSIS — Z4659 Encounter for fitting and adjustment of other gastrointestinal appliance and device: Secondary | ICD-10-CM

## 2016-02-28 HISTORY — PX: BYPASS GRAFT POPLITEAL TO TIBIAL: SHX5764

## 2016-02-28 HISTORY — PX: VEIN HARVEST: SHX6363

## 2016-02-28 LAB — POCT I-STAT 4, (NA,K, GLUC, HGB,HCT)
GLUCOSE: 71 mg/dL (ref 65–99)
Glucose, Bld: 116 mg/dL — ABNORMAL HIGH (ref 65–99)
HCT: 31 % — ABNORMAL LOW (ref 36.0–46.0)
HCT: 29 % — ABNORMAL LOW (ref 36.0–46.0)
HEMOGLOBIN: 9.9 g/dL — AB (ref 12.0–15.0)
Hemoglobin: 10.5 g/dL — ABNORMAL LOW (ref 12.0–15.0)
POTASSIUM: 4.8 mmol/L (ref 3.5–5.1)
Potassium: 4.1 mmol/L (ref 3.5–5.1)
SODIUM: 141 mmol/L (ref 135–145)
Sodium: 146 mmol/L — ABNORMAL HIGH (ref 135–145)

## 2016-02-28 LAB — URINALYSIS, ROUTINE W REFLEX MICROSCOPIC
Bilirubin Urine: NEGATIVE
GLUCOSE, UA: NEGATIVE mg/dL
HGB URINE DIPSTICK: NEGATIVE
KETONES UR: NEGATIVE mg/dL
LEUKOCYTES UA: NEGATIVE
Nitrite: NEGATIVE
PH: 6.5 (ref 5.0–8.0)
Protein, ur: NEGATIVE mg/dL
Specific Gravity, Urine: 1.006 (ref 1.005–1.030)

## 2016-02-28 LAB — GLUCOSE, CAPILLARY
GLUCOSE-CAPILLARY: 159 mg/dL — AB (ref 65–99)
Glucose-Capillary: 84 mg/dL (ref 65–99)
Glucose-Capillary: 173 mg/dL — ABNORMAL HIGH (ref 65–99)

## 2016-02-28 SURGERY — CREATION, BYPASS, ARTERIAL, POPLITEAL TO TIBIAL, USING GRAFT
Anesthesia: General | Site: Leg Lower | Laterality: Right

## 2016-02-28 MED ORDER — ACETAMINOPHEN 500 MG PO TABS
500.0000 mg | ORAL_TABLET | Freq: Four times a day (QID) | ORAL | Status: DC | PRN
  Administered 2016-02-28: 500 mg via ORAL
  Filled 2016-02-28: qty 1

## 2016-02-28 MED ORDER — ALBUTEROL SULFATE (2.5 MG/3ML) 0.083% IN NEBU
3.0000 mL | INHALATION_SOLUTION | Freq: Four times a day (QID) | RESPIRATORY_TRACT | Status: DC | PRN
  Administered 2016-03-08 – 2016-03-09 (×2): 3 mL via RESPIRATORY_TRACT
  Filled 2016-02-28 (×2): qty 3

## 2016-02-28 MED ORDER — FLUTICASONE PROPIONATE 50 MCG/ACT NA SUSP
1.0000 | Freq: Every day | NASAL | Status: DC
Start: 2016-02-28 — End: 2016-03-01
  Administered 2016-02-29: 1 via NASAL
  Filled 2016-02-28: qty 16

## 2016-02-28 MED ORDER — LOSARTAN POTASSIUM 50 MG PO TABS
100.0000 mg | ORAL_TABLET | Freq: Every day | ORAL | Status: DC
  Administered 2016-02-28 – 2016-03-13 (×14): 100 mg via ORAL
  Filled 2016-02-28 (×15): qty 2

## 2016-02-28 MED ORDER — ALUM & MAG HYDROXIDE-SIMETH 200-200-20 MG/5ML PO SUSP: 15.0000 mL | ORAL | Status: DC | PRN

## 2016-02-28 MED ORDER — LABETALOL HCL 5 MG/ML IV SOLN: 10.0000 mg | INTRAVENOUS | Status: DC | PRN

## 2016-02-28 MED ORDER — DEXTROSE 50 % IV SOLN
INTRAVENOUS | Status: DC | PRN
  Administered 2016-02-28: 12.5 g via INTRAVENOUS

## 2016-02-28 MED ORDER — HYDRALAZINE HCL 20 MG/ML IJ SOLN
5.0000 mg | INTRAMUSCULAR | Status: DC | PRN
  Administered 2016-02-28: 5 mg via INTRAVENOUS
  Filled 2016-02-28: qty 1

## 2016-02-28 MED ORDER — MORPHINE SULFATE (PF) 2 MG/ML IV SOLN
2.0000 mg | INTRAVENOUS | Status: DC | PRN
  Administered 2016-02-28 – 2016-02-29 (×4): 2 mg via INTRAVENOUS
  Filled 2016-02-28: qty 2
  Filled 2016-02-28 (×2): qty 1

## 2016-02-28 MED ORDER — DIPHENHYDRAMINE HCL 50 MG/ML IJ SOLN
INTRAMUSCULAR | Status: DC | PRN
  Administered 2016-02-28: 50 mg via INTRAVENOUS

## 2016-02-28 MED ORDER — POTASSIUM CHLORIDE CRYS ER 20 MEQ PO TBCR
20.0000 meq | EXTENDED_RELEASE_TABLET | Freq: Every day | ORAL | Status: DC
  Administered 2016-02-28: 20 meq via ORAL
  Filled 2016-02-28: qty 1

## 2016-02-28 MED ORDER — HEPARIN SODIUM (PORCINE) 1000 UNIT/ML IJ SOLN
INTRAMUSCULAR | Status: DC | PRN
  Administered 2016-02-28: 2000 [IU] via INTRAVENOUS
  Administered 2016-02-28: 11000 [IU] via INTRAVENOUS

## 2016-02-28 MED ORDER — PREGABALIN 50 MG PO CAPS
50.0000 mg | ORAL_CAPSULE | Freq: Three times a day (TID) | ORAL | Status: DC
  Administered 2016-02-28 – 2016-03-01 (×5): 50 mg via ORAL
  Filled 2016-02-28 (×6): qty 1

## 2016-02-28 MED ORDER — HEMOSTATIC AGENTS (NO CHARGE) OPTIME
TOPICAL | Status: DC | PRN
  Administered 2016-02-28: 1 via TOPICAL

## 2016-02-28 MED ORDER — 0.9 % SODIUM CHLORIDE (POUR BTL) OPTIME
TOPICAL | Status: DC | PRN
Start: 2016-02-28 — End: 2016-02-28
  Administered 2016-02-28: 2000 mL

## 2016-02-28 MED ORDER — PROPOFOL 10 MG/ML IV BOLUS
INTRAVENOUS | Status: DC | PRN
  Administered 2016-02-28: 120 mg via INTRAVENOUS

## 2016-02-28 MED ORDER — ONDANSETRON HCL 4 MG/2ML IJ SOLN: 4.0000 mg | Freq: Four times a day (QID) | INTRAMUSCULAR | Status: DC | PRN

## 2016-02-28 MED ORDER — ATORVASTATIN CALCIUM 20 MG PO TABS
20.0000 mg | ORAL_TABLET | Freq: Every day | ORAL | Status: DC
  Administered 2016-02-28 – 2016-02-29 (×2): 20 mg via ORAL
  Filled 2016-02-28 (×3): qty 1

## 2016-02-28 MED ORDER — DEXTROSE 50 % IV SOLN: INTRAVENOUS | Status: DC | PRN

## 2016-02-28 MED ORDER — HYDROCORTISONE NA SUCCINATE PF 100 MG IJ SOLR
INTRAMUSCULAR | Status: DC | PRN
  Administered 2016-02-28: 150 mg via INTRAVENOUS

## 2016-02-28 MED ORDER — PROPOFOL 10 MG/ML IV BOLUS
INTRAVENOUS | Status: AC
  Filled 2016-02-28: qty 40

## 2016-02-28 MED ORDER — PANTOPRAZOLE SODIUM 40 MG PO TBEC
40.0000 mg | DELAYED_RELEASE_TABLET | Freq: Every day | ORAL | Status: DC
  Administered 2016-02-28 – 2016-02-29 (×2): 40 mg via ORAL
  Filled 2016-02-28 (×2): qty 1

## 2016-02-28 MED ORDER — METOPROLOL TARTRATE 5 MG/5ML IV SOLN
2.0000 mg | INTRAVENOUS | Status: DC | PRN
Start: 2016-02-28 — End: 2016-03-01

## 2016-02-28 MED ORDER — FENTANYL CITRATE (PF) 100 MCG/2ML IJ SOLN
25.0000 ug | INTRAMUSCULAR | Status: DC | PRN
  Administered 2016-02-28 (×4): 25 ug via INTRAVENOUS

## 2016-02-28 MED ORDER — ASPIRIN EC 81 MG PO TBEC
81.0000 mg | DELAYED_RELEASE_TABLET | Freq: Every day | ORAL | Status: DC
  Administered 2016-02-28 – 2016-02-29 (×2): 81 mg via ORAL
  Filled 2016-02-28 (×2): qty 1

## 2016-02-28 MED ORDER — VANCOMYCIN HCL IN DEXTROSE 1-5 GM/200ML-% IV SOLN
1000.0000 mg | Freq: Two times a day (BID) | INTRAVENOUS | Status: AC
  Administered 2016-02-28 – 2016-02-29 (×2): 1000 mg via INTRAVENOUS
  Filled 2016-02-28 (×3): qty 200

## 2016-02-28 MED ORDER — SENNOSIDES-DOCUSATE SODIUM 8.6-50 MG PO TABS: 1.0000 | ORAL_TABLET | Freq: Every evening | ORAL | Status: DC | PRN

## 2016-02-28 MED ORDER — HEPARIN (PORCINE) IN NACL 100-0.45 UNIT/ML-% IJ SOLN
500.0000 [IU]/h | INTRAMUSCULAR | Status: DC
  Administered 2016-02-28: 500 [IU]/h via INTRAVENOUS
  Filled 2016-02-28: qty 250

## 2016-02-28 MED ORDER — ONDANSETRON HCL 4 MG/2ML IJ SOLN
INTRAMUSCULAR | Status: DC | PRN
  Administered 2016-02-28: 4 mg via INTRAVENOUS

## 2016-02-28 MED ORDER — IOPAMIDOL (ISOVUE-300) INJECTION 61%
INTRAVENOUS | Status: AC
  Filled 2016-02-28: qty 50

## 2016-02-28 MED ORDER — LIDOCAINE HCL (CARDIAC) 20 MG/ML IV SOLN
INTRAVENOUS | Status: DC | PRN
  Administered 2016-02-28: 50 mg via INTRAVENOUS

## 2016-02-28 MED ORDER — PHENYLEPHRINE HCL 10 MG/ML IJ SOLN
INTRAVENOUS | Status: DC | PRN
  Administered 2016-02-28: 10 ug/min via INTRAVENOUS

## 2016-02-28 MED ORDER — FAMOTIDINE IN NACL 20-0.9 MG/50ML-% IV SOLN
20.0000 mg | INTRAVENOUS | Status: DC
  Filled 2016-02-28: qty 50

## 2016-02-28 MED ORDER — GUAIFENESIN-DM 100-10 MG/5ML PO SYRP: 15.0000 mL | ORAL_SOLUTION | ORAL | Status: DC | PRN

## 2016-02-28 MED ORDER — INSULIN DETEMIR 100 UNIT/ML ~~LOC~~ SOLN
25.0000 [IU] | Freq: Two times a day (BID) | SUBCUTANEOUS | Status: DC
Start: 2016-02-28 — End: 2016-03-02
  Administered 2016-02-28 – 2016-02-29 (×3): 25 [IU] via SUBCUTANEOUS
  Filled 2016-02-28 (×6): qty 0.25

## 2016-02-28 MED ORDER — SUGAMMADEX SODIUM 200 MG/2ML IV SOLN
INTRAVENOUS | Status: DC | PRN
  Administered 2016-02-28: 212.2 mg via INTRAVENOUS

## 2016-02-28 MED ORDER — LACTATED RINGERS IV SOLN
INTRAVENOUS | Status: DC | PRN
  Administered 2016-02-28 (×3): via INTRAVENOUS

## 2016-02-28 MED ORDER — PHENOL 1.4 % MT LIQD: 1.0000 | OROMUCOSAL | Status: DC | PRN

## 2016-02-28 MED ORDER — POTASSIUM CHLORIDE CRYS ER 20 MEQ PO TBCR: 20.0000 meq | EXTENDED_RELEASE_TABLET | Freq: Every day | ORAL | Status: DC | PRN

## 2016-02-28 MED ORDER — OXYCODONE-ACETAMINOPHEN 5-325 MG PO TABS
1.0000 | ORAL_TABLET | ORAL | Status: DC | PRN
  Administered 2016-02-28: 2 via ORAL

## 2016-02-28 MED ORDER — PROMETHAZINE HCL 25 MG/ML IJ SOLN
6.2500 mg | INTRAMUSCULAR | Status: DC | PRN
  Administered 2016-02-28: 6.25 mg via INTRAVENOUS

## 2016-02-28 MED ORDER — ROCURONIUM BROMIDE 100 MG/10ML IV SOLN
INTRAVENOUS | Status: DC | PRN
  Administered 2016-02-28: 50 mg via INTRAVENOUS

## 2016-02-28 MED ORDER — OXYCODONE-ACETAMINOPHEN 5-325 MG PO TABS
ORAL_TABLET | ORAL | Status: AC
  Administered 2016-02-28: 2 via ORAL
  Filled 2016-02-28: qty 2

## 2016-02-28 MED ORDER — MEPERIDINE HCL 25 MG/ML IJ SOLN: 6.2500 mg | INTRAMUSCULAR | Status: DC | PRN

## 2016-02-28 MED ORDER — FUROSEMIDE 80 MG PO TABS
80.0000 mg | ORAL_TABLET | Freq: Two times a day (BID) | ORAL | Status: DC
  Administered 2016-02-28 – 2016-03-02 (×5): 80 mg via ORAL
  Filled 2016-02-28 (×5): qty 1

## 2016-02-28 MED ORDER — SODIUM CHLORIDE 0.9 % IV SOLN
INTRAVENOUS | Status: DC | PRN
  Administered 2016-02-28: 10:00:00

## 2016-02-28 MED ORDER — SODIUM CHLORIDE 0.9 % IV SOLN
INTRAVENOUS | Status: DC
  Administered 2016-02-28: 1000 mL via INTRAVENOUS

## 2016-02-28 MED ORDER — BISACODYL 5 MG PO TBEC: 5.0000 mg | DELAYED_RELEASE_TABLET | Freq: Every day | ORAL | Status: DC | PRN

## 2016-02-28 MED ORDER — SODIUM CHLORIDE 0.9 % IV SOLN: 500.0000 mL | Freq: Once | INTRAVENOUS | Status: DC | PRN

## 2016-02-28 MED ORDER — MIDAZOLAM HCL 2 MG/2ML IJ SOLN: 0.5000 mg | Freq: Once | INTRAMUSCULAR | Status: DC | PRN

## 2016-02-28 MED ORDER — POLYETHYLENE GLYCOL 3350 17 G PO PACK
17.0000 g | PACK | Freq: Two times a day (BID) | ORAL | Status: DC
  Administered 2016-02-29 (×2): 17 g via ORAL
  Filled 2016-02-28 (×3): qty 1

## 2016-02-28 MED ORDER — DOCUSATE SODIUM 100 MG PO CAPS
100.0000 mg | ORAL_CAPSULE | Freq: Two times a day (BID) | ORAL | Status: DC
  Administered 2016-02-28 – 2016-02-29 (×3): 100 mg via ORAL
  Filled 2016-02-28 (×3): qty 1

## 2016-02-28 MED ORDER — CARVEDILOL 12.5 MG PO TABS
25.0000 mg | ORAL_TABLET | Freq: Two times a day (BID) | ORAL | Status: DC
  Administered 2016-02-29 – 2016-03-08 (×15): 25 mg via ORAL
  Filled 2016-02-28 (×10): qty 2
  Filled 2016-02-28: qty 1
  Filled 2016-02-28: qty 2
  Filled 2016-02-28: qty 8
  Filled 2016-02-28 (×2): qty 2
  Filled 2016-02-28: qty 1
  Filled 2016-02-28: qty 2

## 2016-02-28 MED ORDER — FENTANYL CITRATE (PF) 100 MCG/2ML IJ SOLN
INTRAMUSCULAR | Status: DC | PRN
  Administered 2016-02-28: 50 ug via INTRAVENOUS
  Administered 2016-02-28: 150 ug via INTRAVENOUS
  Administered 2016-02-28: 50 ug via INTRAVENOUS

## 2016-02-28 MED ORDER — FENTANYL CITRATE (PF) 250 MCG/5ML IJ SOLN
INTRAMUSCULAR | Status: AC
  Filled 2016-02-28: qty 5

## 2016-02-28 MED ORDER — IOPAMIDOL (ISOVUE-300) INJECTION 61%
INTRAVENOUS | Status: DC | PRN
  Administered 2016-02-28: 20 mL via INTRA_ARTERIAL

## 2016-02-28 MED ORDER — INSULIN ASPART 100 UNIT/ML ~~LOC~~ SOLN
0.0000 [IU] | Freq: Three times a day (TID) | SUBCUTANEOUS | Status: DC
  Administered 2016-02-29 (×2): 5 [IU] via SUBCUTANEOUS
  Administered 2016-02-29: 3 [IU] via SUBCUTANEOUS

## 2016-02-28 MED ORDER — LORATADINE 10 MG PO TABS
10.0000 mg | ORAL_TABLET | Freq: Every day | ORAL | Status: DC
  Administered 2016-02-28 – 2016-02-29 (×2): 10 mg via ORAL
  Filled 2016-02-28 (×3): qty 1

## 2016-02-28 MED ORDER — FENTANYL CITRATE (PF) 100 MCG/2ML IJ SOLN
INTRAMUSCULAR | Status: AC
  Administered 2016-02-28: 25 ug via INTRAVENOUS
  Filled 2016-02-28: qty 2

## 2016-02-28 MED ORDER — MAGNESIUM SULFATE 2 GM/50ML IV SOLN
2.0000 g | Freq: Every day | INTRAVENOUS | Status: DC | PRN
  Filled 2016-02-28: qty 50

## 2016-02-28 MED ORDER — PROMETHAZINE HCL 25 MG/ML IJ SOLN
INTRAMUSCULAR | Status: AC
  Filled 2016-02-28: qty 1

## 2016-02-28 SURGICAL SUPPLY — 68 items
BIOPATCH RED 1 DISK 7.0 (GAUZE/BANDAGES/DRESSINGS) ×2 IMPLANT
BIOPATCH RED 1IN DISK 7.0MM (GAUZE/BANDAGES/DRESSINGS) ×1
CANISTER SUCTION 2500CC (MISCELLANEOUS) ×3 IMPLANT
CANNULA VESSEL 3MM 2 BLNT TIP (CANNULA) ×3 IMPLANT
CLIP TI MEDIUM 24 (CLIP) ×3 IMPLANT
CLIP TI WIDE RED SMALL 24 (CLIP) ×3 IMPLANT
COVER PROBE W GEL 5X96 (DRAPES) ×3 IMPLANT
COVER SURGICAL LIGHT HANDLE (MISCELLANEOUS) ×3 IMPLANT
DRAIN CHANNEL 15F RND FF W/TCR (WOUND CARE) IMPLANT
DRAIN WOUND SNY 15 RND (WOUND CARE) ×3 IMPLANT
DRAPE INCISE 23X17 IOBAN STRL (DRAPES) ×2
DRAPE INCISE IOBAN 23X17 STRL (DRAPES) ×1 IMPLANT
DRAPE X-RAY CASS 24X20 (DRAPES) ×3 IMPLANT
ELECT REM PT RETURN 9FT ADLT (ELECTROSURGICAL) ×3
ELECTRODE REM PT RTRN 9FT ADLT (ELECTROSURGICAL) ×1 IMPLANT
EVACUATOR SILICONE 100CC (DRAIN) ×3 IMPLANT
GAUZE SPONGE 4X4 16PLY XRAY LF (GAUZE/BANDAGES/DRESSINGS) ×3 IMPLANT
GLOVE BIO SURGEON STRL SZ 6.5 (GLOVE) ×6 IMPLANT
GLOVE BIO SURGEON STRL SZ7 (GLOVE) ×6 IMPLANT
GLOVE BIO SURGEONS STRL SZ 6.5 (GLOVE) ×3
GLOVE BIOGEL M 6.5 STRL (GLOVE) ×3 IMPLANT
GLOVE BIOGEL PI IND STRL 6.5 (GLOVE) ×5 IMPLANT
GLOVE BIOGEL PI IND STRL 7.0 (GLOVE) ×3 IMPLANT
GLOVE BIOGEL PI IND STRL 7.5 (GLOVE) ×3 IMPLANT
GLOVE BIOGEL PI IND STRL 8 (GLOVE) ×1 IMPLANT
GLOVE BIOGEL PI INDICATOR 6.5 (GLOVE) ×10
GLOVE BIOGEL PI INDICATOR 7.0 (GLOVE) ×6
GLOVE BIOGEL PI INDICATOR 7.5 (GLOVE) ×6
GLOVE BIOGEL PI INDICATOR 8 (GLOVE) ×2
GLOVE ECLIPSE 7.0 STRL STRAW (GLOVE) ×3 IMPLANT
GLOVE SURG SS PI 6.5 STRL IVOR (GLOVE) ×15 IMPLANT
GLOVE SURG SS PI 7.0 STRL IVOR (GLOVE) ×3 IMPLANT
GOWN STRL REUS W/ TWL LRG LVL3 (GOWN DISPOSABLE) ×10 IMPLANT
GOWN STRL REUS W/ TWL XL LVL3 (GOWN DISPOSABLE) ×1 IMPLANT
GOWN STRL REUS W/TWL LRG LVL3 (GOWN DISPOSABLE) ×20
GOWN STRL REUS W/TWL XL LVL3 (GOWN DISPOSABLE) ×2
HEMOSTAT SPONGE AVITENE ULTRA (HEMOSTASIS) ×3 IMPLANT
KIT BASIN OR (CUSTOM PROCEDURE TRAY) ×3 IMPLANT
KIT ROOM TURNOVER OR (KITS) ×3 IMPLANT
LIQUID BAND (GAUZE/BANDAGES/DRESSINGS) ×3 IMPLANT
NS IRRIG 1000ML POUR BTL (IV SOLUTION) ×9 IMPLANT
PACK PERIPHERAL VASCULAR (CUSTOM PROCEDURE TRAY) ×3 IMPLANT
PAD ARMBOARD 7.5X6 YLW CONV (MISCELLANEOUS) ×6 IMPLANT
SET COLLECT BLD 21X3/4 12 (NEEDLE) ×3 IMPLANT
SPONGE GAUZE 4X4 12PLY STER LF (GAUZE/BANDAGES/DRESSINGS) ×3 IMPLANT
SPONGE INTESTINAL PEANUT (DISPOSABLE) ×3 IMPLANT
SPONGE LAP 18X18 X RAY DECT (DISPOSABLE) ×6 IMPLANT
STOPCOCK 4 WAY LG BORE MALE ST (IV SETS) ×3 IMPLANT
SUT ETHILON 3 0 PS 1 (SUTURE) IMPLANT
SUT MNCRL AB 4-0 PS2 18 (SUTURE) ×6 IMPLANT
SUT PROLENE 5 0 C 1 24 (SUTURE) ×3 IMPLANT
SUT PROLENE 6 0 BV (SUTURE) ×27 IMPLANT
SUT PROLENE 7 0 BV 1 (SUTURE) ×36 IMPLANT
SUT SILK 2 0 FS (SUTURE) IMPLANT
SUT SILK 3 0 (SUTURE)
SUT SILK 3-0 18XBRD TIE 12 (SUTURE) IMPLANT
SUT SILK 4 0 (SUTURE) ×2
SUT SILK 4-0 18XBRD TIE 12 (SUTURE) ×1 IMPLANT
SUT VIC AB 2-0 CT1 27 (SUTURE) ×2
SUT VIC AB 2-0 CT1 TAPERPNT 27 (SUTURE) ×1 IMPLANT
SUT VIC AB 3-0 SH 27 (SUTURE) ×6
SUT VIC AB 3-0 SH 27X BRD (SUTURE) ×3 IMPLANT
TAPE CLOTH SURG 4X10 WHT LF (GAUZE/BANDAGES/DRESSINGS) ×3 IMPLANT
TRAY FOLEY CATH SILVER 16FR LF (SET/KITS/TRAYS/PACK) ×3 IMPLANT
TRAY FOLEY W/METER SILVER 16FR (SET/KITS/TRAYS/PACK) IMPLANT
TUBING EXTENTION W/L.L. (IV SETS) ×3 IMPLANT
UNDERPAD 30X30 INCONTINENT (UNDERPADS AND DIAPERS) ×3 IMPLANT
WATER STERILE IRR 1000ML POUR (IV SOLUTION) ×3 IMPLANT

## 2016-02-28 NOTE — Transfer of Care (Signed)
Immediate Anesthesia Transfer of Care Note  Patient: [name]  Procedure(s) Performed: Procedure(s): BYPASS GRAFT RIGHT BELOW KNEE POPLITEAL TO PERONEAL USING REVERSED RIGHT GREATER SAPHENOUS VEIN (Right) RIGHT GREATER SAPHENOUS VEIN HARVEST (Right)  Patient Location: PACU  Anesthesia Type:General  Level of Consciousness: awake and alert   Airway & Oxygen Therapy: Patient Spontanous Breathing and Patient connected to nasal cannula oxygen  Post-op Assessment: Report given to RN and Post -op Vital signs reviewed and stable  Post vital signs: Reviewed and stable  Last Vitals:  Vitals:   02/28/16 0654  BP: (!) 173/53  Pulse: 66  Resp: 20  Temp: 36.5 C    Last Pain:  Vitals:   02/28/16 0709  TempSrc:   PainSc: 5       Patients Stated Pain Goal: 4 ([id number])  Complications: No apparent anesthesia complications

## 2016-02-28 NOTE — Progress Notes (Signed)
  Day of Surgery Note    Subjective:  sleepy  Vitals:   02/28/16 0654 02/28/16 1633  BP: (!) 173/53 111/65  Pulse: 66 73  Resp: 20 17  Temp: 97.7 F (36.5 C) 97.9 F (36.6 C)    Incisions:   Clean and dry Extremities:  + doppler signal right AT; +RLE edema Cardiac:  regular Lungs:  Non labored   Assessment/Plan:  This is a 75 y.o. female who is s/p right popliteal to peroneal bypass grafting  -pt doing well in pacu with +AT doppler signal right -only 35cc out of drain - continue to monitor -to [place] when bed available   Leontine Locket, PA-C 02/28/2016 4:58 PM

## 2016-02-28 NOTE — Anesthesia Procedure Notes (Signed)
Procedure Name: Intubation Date/Time: 02/28/2016 8:50 AM Performed by: Eligha Bridegroom Pre-anesthesia Checklist: Patient identified, Emergency Drugs available, Suction available, Patient being monitored and Timeout performed Patient Re-evaluated:Patient Re-evaluated prior to inductionOxygen Delivery Method: Circle system utilized Preoxygenation: Pre-oxygenation with 100% oxygen Intubation Type: IV induction Ventilation: Mask ventilation without difficulty and Oral airway inserted - appropriate to patient size Laryngoscope Size: Mac and 3 Grade View: Grade II Tube type: Oral Tube size: 7.0 mm Number of attempts: 1 Airway Equipment and Method: Stylet Secured at: 21 cm Tube secured with: Tape Dental Injury: Teeth and Oropharynx as per pre-operative assessment

## 2016-02-28 NOTE — H&P (View-Only) (Signed)
Established Critical Limb Ischemia Patient  History of Present Illness  [name] is a 75 y.o. (1940/10/25) female who presents with chief complaint: continued poor healing of right foot.  The patient has not rest pain and wounds include: R dorsal wound overlying 1st MT.  The patient notes symptoms have not progressed but her wound appears to be enlarging.  The patient's treatment regimen currently included: maximal medical management and wound care.  She underwent angiography on 12/28/15: bilateral extensive tibial disease.  Based on the images, her only reconstruction option was a R pop to mid-peroneal bypass with vein.  She was sent to Cardiology for further evaluation.  Nuclear stress testing appears to be low risk.  She has follow up with Cardiology on 24 JUL 17.  Past Medical History  Diagnosis Date  . HTN (hypertension)   . Diabetes mellitus without complication ([place])   . High cholesterol   . Spasm of back muscles   . Aortic stenosis     Past Surgical History  Procedure Laterality Date  . Intramedullary (im) nail intertrochanteric Left 02/04/2014    Procedure: INTRAMEDULLARY (IM) NAIL INTERTROCHANTRIC FEMORAL;  Surgeon: Mauri Pole, MD;  Location: [facility];  Service: Orthopedics;  Laterality: Left;  . Orif toe fracture Right 02/08/2014    Procedure: OPEN REDUCTION INTERNAL FIXATION Right METATARSAL  FRACTURE ;  Surgeon: Wylene Simmer, MD;  Location: [facility];  Service: Orthopedics;  Laterality: Right;  . Peripheral vascular catheterization N/A 12/28/2015    Procedure: Abdominal Aortogram w/Lower Extremity;  Surgeon: Conrad [place], MD;  Location: [facility];  Service: Cardiovascular;  Laterality: N/A;    Social History   Social History  . Marital Status: Married    Spouse Name: N/A  . Number of Children: N/A  . Years of Education: N/A   Occupational History  . Not on file.   Social History Main Topics  . Smoking status: Never Smoker   . Smokeless tobacco: Never Used       Comment: Never smoked  . Alcohol Use: No  . Drug Use: No  . Sexual Activity: No   Other Topics Concern  . Not on file   Social History Narrative    Family History  Problem Relation Age of Onset  . Diabetes Other     Current Outpatient Prescriptions  Medication Sig Dispense Refill  . acetaminophen (TYLENOL) 325 MG tablet Take 2 tablets (650 mg total) by mouth every 6 (six) hours as needed for mild pain.    [name] acetaminophen (TYLENOL) 500 MG tablet Take 500 mg by mouth every 6 (six) hours as needed for mild pain.    [name] aspirin EC 81 MG tablet Take 81 mg by mouth daily.     . carvedilol (COREG) 3.125 MG tablet Take 1 tablet (3.125 mg total) by mouth 2 (two) times daily with a meal.    . cetirizine (ZYRTEC) 10 MG tablet Take 10 mg by mouth daily as needed for allergies.    [name] docusate sodium (COLACE) 100 MG capsule Take 100 mg by mouth 2 (two) times daily.    . fluticasone (FLONASE) 50 MCG/ACT nasal spray Place 1 spray into both nostrils daily.    . furosemide (LASIX) 20 MG tablet Take 80 mg by mouth 2 (two) times daily.     . insulin aspart (NOVOLOG) 100 UNIT/ML injection Inject 0-15 Units into the skin 3 (three) times daily with meals. Before each meal 3 times a day, 140-199 -  3 units, 200-250 - 5 units, 251-299 - 8 units,  300-349 - 12 units,  350 or above 15 units. (Patient taking differently: Inject 4 Units into the skin 2 (two) times daily. ) 10 mL 11  . insulin detemir (LEVEMIR) 100 UNIT/ML injection Inject 25 Units into the skin 2 (two) times daily.     [name] losartan (COZAAR) 100 MG tablet Take 1 tablet (100 mg total) by mouth daily.    [name] omeprazole (PRILOSEC) 20 MG capsule Take 20 mg by mouth daily.    . polyethylene glycol (MIRALAX / GLYCOLAX) packet Take 17 g by mouth 2 (two) times daily. 14 each 0  . potassium chloride SA (K-DUR,KLOR-CON) 20 MEQ tablet Take 20 mEq by mouth daily.    . pregabalin (LYRICA) 50 MG capsule Take 50 mg by mouth 3 (three) times daily.    [name] atorvastatin  (LIPITOR) 20 MG tablet     . B-D INS SYRINGE 0.5CC/31GX5/16 31G X 5/16" 0.5 ML MISC     . carvedilol (COREG) 12.5 MG tablet     . carvedilol (COREG) 25 MG tablet     . furosemide (LASIX) 40 MG tablet     . HYDROcodone-acetaminophen (NORCO/VICODIN) 5-325 MG per tablet Take one tablet by mouth every 4 hours as needed for pain (Patient not taking: Reported on 12/08/2015) 180 tablet 0   No current facility-administered medications for this visit.     Allergies  Allergen Reactions  . Iodine Anaphylaxis  . Penicillins Anaphylaxis  . Shellfish Allergy Anaphylaxis  . Sulfa Antibiotics Anaphylaxis    REVIEW OF SYSTEMS:   [X]  denotes positive finding, [ ]  denotes negative finding Cardiac  Comments:  Chest pain or chest pressure:    Shortness of breath upon exertion:    Short of breath when lying flat: x   Irregular heart rhythm:        Vascular    Pain in calf, thigh, or hip brought on by ambulation:    Pain in feet at night that wakes you up from your sleep:  x   Blood clot in your veins:    Leg swelling:  x       Pulmonary    Oxygen at home:    Productive cough:     Wheezing:         Neurologic    Sudden weakness in arms  x   Sudden numbness in arms  x   Sudden onset of difficulty speaking or slurred speech:    Temporary loss of vision in one eye:     Problems with dizziness:         Gastrointestinal    Blood in stool:  x Hx + hemoccult 2015  Vomited blood:         Genitourinary    Burning when urinating:     Blood in urine:        Psychiatric    Major depression:         Hematologic    Bleeding problems:    Problems with blood clotting too easily:        Skin    Rashes or ulcers:        Constitutional    Fever or chills:          Physical Examination  Filed Vitals:   02/16/16 1209 02/16/16 1216  BP: 159/68 155/69  Pulse:  60   Height: 5\' 5"  (1.651 m)   Weight: 210 lb (95.255 kg)   SpO2: 96%  Body mass index is 34.95 kg/(m^2).  General: A&O x 3, WD, mildly obese  Eyes: PERRLA, EOMI  Pulmonary: Sym exp, good air movt, CTAB, no rales, rhonchi, & wheezing  Cardiac: RRR, Nl S1, S2, no Murmurs, rubs or gallops  Vascular: Vessel Right Left  Radial Palpable Palpable  Brachial Palpable Palpable  Carotid Palpable, without bruit Palpable, without bruit  Aorta Not palpable N/A  Femoral Palpable Palpable  Popliteal Not palpable Not palpable  PT Not Palpable Not Palpable  DP Not Palpable Not Palpable   Gastrointestinal: soft, NTND, no G/R, no HSM, no masses, no CVAT B  Musculoskeletal: M/S 5/5 throughout , Extremities without ischemic changes except wound overlying L 1st MT: good granulation, appears bigger than previously  Neurologic: Pain and light touch intact in extremities except decreased sensation in feet and hands, Motor exam as listed above   Medical Decision Making  [name] is a 75 y.o. female who presents with: critical limb ischemia with R foot ulcer   Based on the patient's vascular studies and examination, I have offered the patient: R pop to peroneal bypass.  She is schedule for the 26 JUL 17. The risk, benefits, and alternative for bypass operations were discussed with the patient.   The patient is aware the risks include but are not limited to: bleeding, infection, myocardial infarction, stroke, limb loss, nerve damage, need for additional procedures in the future, wound complications, and inability to complete the bypass.  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   However, without the bypass, her wound is enlarging. She does not want to consider amputation, so at this point, would recommend proceeding with the bypass The patient is aware of these risks and  agreed to proceed.  I discussed in depth with the patient the nature of atherosclerosis, and emphasized the importance of maximal medical management including strict control of blood pressure, blood glucose, and lipid levels, antiplatelet agents, obtaining regular exercise, and cessation of smoking.    The patient is aware that without maximal medical management the underlying atherosclerotic disease process will progress, limiting the benefit of any interventions. The patient is currently on a statin: Lipitor. The patient is currently on an anti-platelet: ASA/  Thank you for allowing [place] to participate in this patient's care.   Adele Barthel, MD, FACS [facility] Office: [phone] Pager: [phone]

## 2016-02-28 NOTE — Interval H&P Note (Signed)
History and Physical Interval Note:  02/28/2016 8:25 AM  [name]  has presented today for surgery, with the diagnosis of Right foot ulcer L97.409  The various methods of treatment have been discussed with the patient and family. After consideration of risks, benefits and other options for treatment, the patient has consented to  Procedure(s): BYPASS GRAFT POPLITEAL TO PERONEAL (Right) as a surgical intervention .  The patient's history has been reviewed, patient examined, no change in status, stable for surgery.  I have reviewed the patient's chart and labs.  Questions were answered to the patient's satisfaction.     Adele Barthel

## 2016-02-28 NOTE — Op Note (Addendum)
OPERATIVE NOTE   PROCEDURE: 1. Right below-the-knee popliteal artery to peroneal artery bypass with reversed greater saphenous vein  2. Endarterectomy of mid-segment peroneal artery  PRE-OPERATIVE DIAGNOSIS: non-healing right first metatarsal ulcer, extensive tibial disease  POST-OPERATIVE DIAGNOSIS: same as above   SURGEON: Adele Barthel, MD  ASSISTANT(S): Silva Bandy, PAC; Gerri Lins, [facility]   ANESTHESIA: general  ESTIMATED BLOOD LOSS: 400 cc  FINDING(S): 1.  Extensive varicosities and edema in subcutaneous tissue 2.  Lateral deviation of popliteal vessels. 3.  On exploration of calf: No dopplerable signals past proximal peroneal artery.  Occluded past mid-segment.  Dopplerable tibioperoneal trunk.  No dopplerable posterior tibial artery signal 4.  Calcific atherosclerosis with no flow in mid-segment peroneal artery.  Some limited backbleeding after endarterectomy. 5.  Suboptimal completion angiogram with no obvious flow past mid-segment peroneal artery except for collateral flow. 6.  No change in pedal signals: no signals  SPECIMEN(S):  none  INDICATIONS:   [name] is a 75 y.o. female who presents with non-healing post-surgical wound overlying right first metatarsal.  Angiogram that was completed demonstrated only diseased peroneal runoff to the right foot.  She was sent for pre-operative cardiac work-up and vein mapping in anticipation of a bypass if she failed to heal.  Her right foot wound progressed since her initial visit, so I recommended: right popliteal to peroneal bypass as an attempt to salvage the right foot.  The risk, benefits, and alternative for bypass operations were discussed with the patient.  The patient is aware the risks include but are not limited to: bleeding, infection, myocardial infarction, stroke, limb loss, nerve damage, need for additional procedures in the future, wound complications, and inability to complete the bypass. The patient is aware of  these risks and agreed to proceed.  DESCRIPTION: After obtaining full informed written consent, the patient was brought back to the operating room and placed supine upon the operating table.  The patient received IV antibiotics prior to induction.  After obtaining adequate anesthesia, the patient was prepped and draped in the standard fashion for:  Right leg bypass.  I evaluated the right foot with Sonosite and there was extensive edema in this right calf.  I was able to identify the location of the greater saphenous vein with some difficulty.  I made an incision one finger-width posterior to the tibia with a 10-blade from the mid-calf up to the level of the proximal calf.  There was extensive fat in this calf, so a slow exploration of the calf with electrocautery was completed.  I came across the saphenous nerve and greater saphenous vein in this dissection.  I completed the dissection of the vein and nerve in the calf.  I dissected out the vein and nerve and retracted it posteriorly.  I then opened the fascia with electrocautery.  There was an extensive amount of fat overlying the popliteal vessels.  I carefully dissected this fat pad off the vessels.  With great difficulty, I dissected out the medial popliteal vein.  This vein and popliteal artery were laterally deviated.  I retracted the vein posteriorly.  I dissected out the popliteal artery.  This artery appeared to be somewhat diseased with obvious external atherosclerotic changes.  The artery was not calcified, however.  I felt this was acceptable for use as inflow.  I then took down the soleus muscle with electrocautery, following the popliteal vein into its divisions.  This allowed me to identify the tibioperoneal trunk, which had a dampened  monophasic signal.  I then carried the dissection distally, taking down the muscle groups to visualize the posterior tibial artery and peroneal arteries in the mid-calf.  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  I had to repair multiple veins with 6-0 and 7-0 stitches during this dissection process.  There was a dopplerable signal in the proximal peroneal artery, but NO signal in the mid-segment.  This was a change from the prior angiogram.  There NO signal in the mid-segment posterior tibial artery.  The anterior tibial artery was not dissected out as it was occluded on the angiogram with short segment reconstitution, thus not a possible target.  I had concerns are the mid-segment peroneal artery appeared to be small and diseased but not calcified (1.5-2 mm).  I dissected distally and the peroneal artery did not appear to be improved.  This patient has stated her preference to avoid amputation in the past, so I felt that her only chance of avoid that would be to attempt the popliteal to peroneal bypass, given she did not have any peroneal artery signal past the proximal peroneal artery.  I re-measured the length of vein needed in this case, and there appeared to be adequate calf vein for use.  I ligated the distal greater saphenous vein and transected the vein.  I inserted a vessel cannula and tied a 2-0 silk around the vessel cannula.  I clamped the greater saphenous vein proximally in the calf and hydrodistended the vein.  The vein appeared to be somewhat diseased but distended to 3 mm in diameter.  I felt it was adequate for use as a conduit.  I ligated the greater saphenous vein proximally and transected the vein.  I tested the vein and there was no leaks noted.  There was no difference in caliber between the proximal and distal, so I elected to use it in a reversed configuration.  I reset my exposure of the below-the-knee popliteal artery.  The patient was given 11000 units of Heparin intravenously, which was a therapeutic bolus. An additional 2000 units of Heparin was administered every hour after initial bolus to  maintain anticoagulation.  In total, 17000 units of Heparin was administrated to achieve and maintain a therapeutic level of anticoagulation.   After waiting 3 minutes, I placed the popliteal artery under tension proximally and distally.  I made an arteriotomy with a 11-blade and then extended it proximally and distally with a Potts scissor.  Immediately, some atherosclerotic plaque was noted.  I removed some of the fractured plaque.  I spatulated the distal end of the vein conduit to meet the dimensions of the arteriotomy.  I sewed the vein conduit to popliteal artery with a running stitch of 6-0 Prolene, taking care to tack down the atherosclerotic disease in the wall in the process.  Upon completing this anastomosis, I released the vessel loops and there was a pulse in the vein conduit.  Though this was attenuated distally, I still got pulsatile bleeding out the distal end of the vein.  This is consistent with the diseased status of this vein.  I clamped the vein proximally.  I then reset my exposure of the mid-segment peroneal artery.  I clamped the peroneal artery proximally and distally with Seraphim clamps.  I made an arteriotomy, but there was no bleeding.  Immediately, I saw calcified core of atherosclerosis.  I completed an endarterectomy of this mid-segment of the  peroneal artery with Penfield dissection, but I still did not get any backbleeding.  I carried the dissection distally and then extended the arteriotomy and endarterectomy distally, until I got some arterial backbleeding from collaterals and via the distal artery.  The residual arterial wall was diseased and thin.  I could dilate the peroneal artery with the end of the right angle but I could only pass a 2 mm dilator a limited distance distally, suggesting a <2 mm open lumen.  At this point, the arteriotomy was >5 cm with no antegrade bleeding, suggesting occlusion of the distal segment of the proximal peroneal artery, consistent with findings  on the angiogram.  I ligated the distal end of the proximal 1/3 of the peroneal artery.  I transected the mid-segment of the peroneal artery, converting this artery in an end-to-end configuration.  I spatulated the distal end of the vein conduit, shortening the conduit in the process.  I sewed a 3 cm anastomosis between the vein conduit and peroneal artery with a running stitch of 7-0 Prolene.  Prior to completing this anastomosis, I backbled the peroneal artery and there continued to be a trickle of backbleeding via collaterals.  There remained pulsatile blood flow the vein conduit.  I completed this anastomosis in the usual fashion.  I repaired a few bleeding points in the suture line with 7-0 Prolene stitches.  I released all clamps.  There was a palpable pulse in the vein conduit, but I could feel no pulse in the peroneal artery.  I could NOT consistently get a doppler signal in the vein conduit despite easily feeling a pulses.  This was despite changing the transducer and doppler base.    Subsequently, I felt a completion angiogram was needed.  I cannulated the vein conduit proximally with a butterfly needle and did a hand injection with a flat-plate X-ray panel.  This injection was suboptimal but was consistent with a diseased peroneal runoff with collateral filling down to the foot.  I had concerns that there was limited perfusion to the foot.  Given the distal peroneal artery was similarly diseased, I did not think jumping distally was likely to have a different outcome.  I repaired the needle hole with a 7-0 Prolene Z-stitch.  I removed the butterfly needle and then tied down the stitch.  I packed the incisions with Avitene.  After waiting a few minutes, there was no further active bleeding.    Due to the poor quality distal target, I plan on continuing Heparin at 500 units/hr over night.  I made a stab incision distally in the calf and dissected through the subcutaneous tissue with a tonsil clamp.  I  pulled a 15 round JP through the subcutaneous tissue.  I sharply shortened the drain and placed it deep in the calf, adjacent to the tibial arteries.  I reapproximated the soleus muscle to protect the vein bypass.  I then reapproximated the subcutaneous tissue with a double layer of 3-0 Vicryl.  The skin was reapproximated with a running subcuticular stitch of 4-0 Vicryl.  The skin was cleaned, dried, and reinforced with Dermabond.    I placed the JP drain under bulb suction.  I rechecked distally for doppler signals, and I could not get any signals: unchanged from pre-operatively.   COMPLICATIONS: none  CONDITION: stable  Adele Barthel, MD [facility] [place] Office: [phone] Pager: [phone]  02/28/2016, 4:44 PM   Addendum Upon re-evaluation in the PACU, the patient has a strong  monophasic peroneal signal with a pink foot.  This suggests some vasospasm which accounted for the lack of signals intraoperatively.  Adele Barthel, MD [facility] [place] Office: [phone] Pager: [phone]  02/28/2016, 6:04 PM

## 2016-02-29 ENCOUNTER — Encounter (HOSPITAL_COMMUNITY): Payer: Self-pay | Admitting: Vascular Surgery

## 2016-02-29 LAB — CBC
HEMATOCRIT: 28.1 % — AB (ref 36.0–46.0)
Hemoglobin: 8.9 g/dL — ABNORMAL LOW (ref 12.0–15.0)
MCH: 27.1 pg (ref 26.0–34.0)
MCHC: 31.7 g/dL (ref 30.0–36.0)
MCV: 85.7 fL (ref 78.0–100.0)
Platelets: 157 10*3/uL (ref 150–400)
RBC: 3.28 MIL/uL — ABNORMAL LOW (ref 3.87–5.11)
RDW: 16.2 % — AB (ref 11.5–15.5)
WBC: 15.3 10*3/uL — ABNORMAL HIGH (ref 4.0–10.5)

## 2016-02-29 LAB — BASIC METABOLIC PANEL
ANION GAP: 7 (ref 5–15)
BUN: 16 mg/dL (ref 6–20)
CO2: 24 mmol/L (ref 22–32)
Calcium: 8.2 mg/dL — ABNORMAL LOW (ref 8.9–10.3)
Chloride: 104 mmol/L (ref 101–111)
Creatinine, Ser: 0.76 mg/dL (ref 0.44–1.00)
GFR calc Af Amer: >60 mL/min (ref >60)
GLUCOSE: 218 mg/dL — AB (ref 65–99)
Potassium: 5.2 mmol/L — ABNORMAL HIGH (ref 3.5–5.1)
Sodium: 135 mmol/L (ref 135–145)

## 2016-02-29 LAB — GLUCOSE, CAPILLARY
GLUCOSE-CAPILLARY: 208 mg/dL — AB (ref 65–99)
GLUCOSE-CAPILLARY: 268 mg/dL — AB (ref 65–99)
Glucose-Capillary: 229 mg/dL — ABNORMAL HIGH (ref 65–99)
Glucose-Capillary: 323 mg/dL — ABNORMAL HIGH (ref 65–99)

## 2016-02-29 LAB — BRAIN NATRIURETIC PEPTIDE: B Natriuretic Peptide: 133.3 pg/mL — ABNORMAL HIGH (ref 0.0–100.0)

## 2016-02-29 MED ORDER — FUROSEMIDE 10 MG/ML IJ SOLN
10.0000 mg | Freq: Once | INTRAMUSCULAR | Status: AC
  Administered 2016-02-29: 10 mg via INTRAVENOUS
  Filled 2016-02-29: qty 2

## 2016-02-29 MED ORDER — COLLAGENASE 250 UNIT/GM EX OINT
TOPICAL_OINTMENT | Freq: Two times a day (BID) | CUTANEOUS | Status: DC
  Administered 2016-02-29: 22:00:00 via TOPICAL
  Administered 2016-02-29: 1 via TOPICAL
  Administered 2016-03-01 – 2016-03-02 (×2): via TOPICAL
  Administered 2016-03-02: 1 via TOPICAL
  Administered 2016-03-03 (×2): via TOPICAL
  Administered 2016-03-04: 1 via TOPICAL
  Administered 2016-03-04 – 2016-03-06 (×5): via TOPICAL
  Administered 2016-03-07: 1 via TOPICAL
  Administered 2016-03-07 – 2016-03-08 (×2): via TOPICAL
  Administered 2016-03-08 – 2016-03-09 (×2): 1 via TOPICAL
  Administered 2016-03-10: via TOPICAL
  Administered 2016-03-10: 1 via TOPICAL
  Administered 2016-03-10 – 2016-03-13 (×6): via TOPICAL
  Filled 2016-02-29 (×2): qty 30

## 2016-02-29 MED ORDER — ACETAMINOPHEN-CODEINE #3 300-30 MG PO TABS
1.0000 | ORAL_TABLET | ORAL | Status: DC | PRN
Start: 2016-02-29 — End: 2016-03-01
  Administered 2016-02-29 (×2): 1 via ORAL
  Filled 2016-02-29 (×2): qty 1

## 2016-02-29 MED ORDER — CLOPIDOGREL BISULFATE 75 MG PO TABS
75.0000 mg | ORAL_TABLET | Freq: Every day | ORAL | Status: DC
  Administered 2016-02-29: 75 mg via ORAL
  Filled 2016-02-29: qty 1

## 2016-02-29 NOTE — Progress Notes (Signed)
Pt states that she is allergic to Percocet and she swells when she takes it.

## 2016-02-29 NOTE — Progress Notes (Addendum)
[facility] Progress Note  Subjective  - POD #1  States right foot hurts. Is groggy this am. Slow to answer orientation questions.   Objective Vitals:   02/29/16 0000 02/29/16 0335  BP: (!) 109/39 (!) 119/56  Pulse: 78 86  Resp: (!) 22 19  Temp: 98 F (36.7 C) 98.1 F (36.7 C)    Intake/Output Summary (Last 24 hours) at 02/29/16 0730 Last data filed at 02/28/16 2300  Gross per 24 hour  Intake          3902.67 ml  Output             2045 ml  Net          1857.67 ml   Oriented to person, place and situation.  Right medial calf incision c/d/i. JP drain with clots.  Weak monophasic right peroneal signal Full thickness wound dorsum right foot is clean.  Edema bilateral lower extremities and upper extremities.   Assessment/Planning: 75 y.o. female is s/p: 1.  Right below-the-knee popliteal artery to peroneal artery bypass with reversed greater saphenous vein 2.  Endarterectomy of mid-segment peroneal artery 1 Day Post-Op   Bypass is patent, but doppler flow is weaker than yesterday. D/c heparin this am and start plavix.  Generalized edema. Will order BNP. Appears to be volume overloaded. Santyl to dorsum right foot wound and wet to dry dressings bid.  Percocet causes swelling. Will change to tylenol #3. Keep JP drain in given high output.  Groggy this am. Will keep in 3S.  Get OOB.   Alvia Grove 02/29/2016 7:30 AM --  Laboratory CBC    Component Value Date/Time   WBC 15.3 (H) 02/29/2016 0042   HGB 8.9 (L) 02/29/2016 0042   HCT 28.1 (L) 02/29/2016 0042   PLT 157 02/29/2016 0042    BMET    Component Value Date/Time   NA 135 02/29/2016 0041   K 5.2 (H) 02/29/2016 0041   CL 104 02/29/2016 0041   CO2 24 02/29/2016 0041   GLUCOSE 218 (H) 02/29/2016 0041   BUN 16 02/29/2016 0041   CREATININE 0.76 02/29/2016 0041   CALCIUM 8.2 (L) 02/29/2016 0041   GFRNONAA >60 02/29/2016 0041   GFRAA >60 02/29/2016 0041    COAG Lab Results    Component Value Date   INR 1.20 02/26/2016   INR 1.11 02/12/2014   INR 1.08 02/04/2014   No results found for: PTT  Antibiotics Anti-infectives    Start     Dose/Rate Route Frequency Ordered Stop   02/28/16 1930  vancomycin (VANCOCIN) IVPB 1000 mg/200 mL premix     1,000 mg 200 mL/hr over 60 Minutes Intravenous Every 12 hours 02/28/16 1624 02/29/16 1929   02/28/16 0700  vancomycin (VANCOCIN) 1,500 mg in sodium chloride 0.9 % 500 mL IVPB     1,500 mg 250 mL/hr over 120 Minutes Intravenous To ShortStay Surgical 02/27/16 0810 02/28/16 1030       Virgina Jock, PA-C [facility] Office: [phone] Pager: [phone] 02/29/2016 7:30 AM    Addendum  I have independently interviewed and examined the patient, and I agree with the physician assistant's findings.  Signal not as strong this AM.  Due to the diseased target, this bypass is at high risk of failure.  Given 335 cc out the drain, will have to stop anticoagulation.  Will start Plavix this AM.  - PT/OT/ABI  Adele Barthel, MD [facility] Office: [phone] Pager: [phone]  02/29/2016, 7:43 AM

## 2016-02-29 NOTE — Progress Notes (Signed)
Occupational Therapy Evaluation Patient Details Name: [name] MRN: [id number] DOB: 1941-08-04 Today's Date: 02/29/2016    History of Present Illness Pt s/p Right below-the-knee popliteal artery to peroneal artery bypass with reversed greater saphenous vein and Endarterectomy of mid-segment peroneal artery (02/28/16) after having non healing wound on R foot. PMH includesL IM nailing, R ORIF toe, HTN, DM, high cholesterol   Clinical Impression   PTA, pt reports she required assistance for all ADLs, basic transfers and use RW or w/c for mobility. Pt currently presents with confusion, generalized weakness, edema in all extremities, and acute RLE pain. Pt required max assist for bed mobility and due to confusion and pain was unsafe to mobilize further than sitting EOB. Pt completed ADLs at bed level. Recommend SNF for post-acute rehab stay as pt's husband is unable to provide necessary level of physical assistance that the pt will need. Pt will benefit from continued acute OT to increase independence and safety with ADLs and mobility to allow for safe discharge to the venue listed below.    Follow Up Recommendations  SNF;Supervision/Assistance - 24 hour    Equipment Recommendations  Other (comment) (TBD at next venue)    Recommendations for Other Services       Precautions / Restrictions Precautions Precautions: Fall Precaution Comments: Drain R LE Restrictions Weight Bearing Restrictions: No      Mobility Bed Mobility Overal bed mobility: Needs Assistance Bed Mobility: Rolling;Supine to Sit Rolling: Mod assist   Supine to sit: Max assist Sit to supine: Max assist   General bed mobility comments: assist to progress bil LE off bed and trunk support to come to sitting position. Pt unable to achieve full sitting position due to confusion and immediately began to lie back down due to increased pain. VCs for hand placement and safety.  Transfers                 General  transfer comment: Not attempted this session for pt/therapist safety due to pt's confusion.    Balance Overall balance assessment: Needs assistance Sitting-balance support: Bilateral upper extremity supported;Feet supported Sitting balance-Leahy Scale: Poor Sitting balance - Comments: Poor to fair some posterior tendency Postural control: Posterior lean                                  ADL Overall ADL's : Needs assistance/impaired Eating/Feeding: Set up;Sitting   Grooming: Wash/dry hands;Wash/dry face;Set up;Sitting   Upper Body Bathing: Minimal assitance;Sitting   Lower Body Bathing: Maximal assistance;Sitting/lateral leans   Upper Body Dressing : Minimal assistance;Sitting   Lower Body Dressing: Maximal assistance;Sitting/lateral leans                 General ADL Comments: Pt very confused and deferred OOB mobility at this time.     Vision Vision Assessment?: No apparent visual deficits   Perception     Praxis      Pertinent Vitals/Pain Pain Assessment: Faces Faces Pain Scale: Hurts even more Pain Location: RLE with movement Pain Descriptors / Indicators: Sore Pain Intervention(s): Limited activity within patient's tolerance;Monitored during session;Repositioned     Hand Dominance Right   Extremity/Trunk Assessment Upper Extremity Assessment Upper Extremity Assessment: Generalized weakness (bil hand swelling)   Lower Extremity Assessment Lower Extremity Assessment: RLE deficits/detail;Generalized weakness (bil feet swelling noted) RLE Deficits / Details: limited ROM and strength as expected post op RLE: Unable to fully assess due to pain  Cervical / Trunk Assessment Cervical / Trunk Assessment: Kyphotic   Communication Communication Communication: No difficulties   Cognition Arousal/Alertness: Awake/alert Behavior During Therapy: WFL for tasks assessed/performed Overall Cognitive Status: Impaired/Different from baseline Area of  Impairment: Orientation;Attention;Memory;Following commands;Problem solving;Awareness;Safety/judgement Orientation Level: Disoriented to;Time Current Attention Level: Focused Memory: Decreased short-term memory Following Commands: Follows one step commands inconsistently;Follows one step commands with increased time Safety/Judgement: Decreased awareness of safety;Decreased awareness of deficits Awareness: Intellectual Problem Solving: Slow processing;Decreased initiation;Difficulty sequencing;Requires verbal cues;Requires tactile cues General Comments: Pt repeating herself when talking about home/PLOF and would forget questioned therapist asked within 30 seconds multiple times.   General Comments       Exercises       Shoulder Instructions      Home Living Family/patient expects to be discharged to:: Skilled nursing facility Living Arrangements: Spouse/significant other   Type of Home: House Home Access: Ramped entrance     Home Layout: One level     Bathroom Shower/Tub: Walk-in shower         Home Equipment: Environmental consultant - 2 wheels;Wheelchair - [employer];Hospital bed;Toilet riser;Grab bars - toilet;Shower seat;Walker - 4 wheels   Additional Comments: Son reports that pt's spouse health is not optimal and would have trouble A pt.      Prior Functioning/Environment Level of Independence: Needs assistance  Gait / Transfers Assistance Needed: Amb with RW for short distances and w/c for longer distances ADL's / Homemaking Assistance Needed: Husband assists pt with bathing, dressing, getting in/out of bed, and supervises all transfers        OT Diagnosis: Generalized weakness;Cognitive deficits;Acute pain   OT Problem List: Decreased strength;Decreased range of motion;Decreased activity tolerance;Impaired balance (sitting and/or standing);Decreased cognition;Decreased safety awareness;Decreased knowledge of use of DME or AE;Decreased knowledge of  precautions;Obesity;Pain   OT Treatment/Interventions: Self-care/ADL training;Therapeutic exercise;DME and/or AE instruction;Therapeutic activities;Patient/family education;Balance training    OT Goals(Current goals can be found in the care plan section) Acute Rehab OT Goals Patient Stated Goal: Short term rehab prior to dc home OT Goal Formulation: With patient Time For Goal Achievement: 03/14/16 Potential to Achieve Goals: Good ADL Goals Pt Will Perform Grooming: with min guard assist Pt Will Perform Upper Body Bathing: with min guard assist;sitting Pt Will Perform Lower Body Bathing: with min guard assist;sit to/from stand Pt Will Transfer to Toilet: with min guard assist;ambulating;bedside commode Pt Will Perform Toileting - Clothing Manipulation and hygiene: with min guard assist;sit to/from stand  OT Frequency: Min 2X/week   Barriers to D/C: Decreased caregiver support  Pt's husband is unable to provide necessary level of physical assistance       Co-evaluation              End of Session Nurse Communication: Mobility status  Activity Tolerance: Patient limited by pain Patient left: in bed;with call bell/phone within reach;with bed alarm set   Time: 1525-1550 OT Time Calculation (min): 25 min Charges:  OT General Charges $OT Visit: 1 Procedure OT Evaluation $OT Eval Moderate Complexity: 1 Procedure OT Treatments $Self Care/Home Management : 8-22 mins G-Codes:    Redmond Baseman, OTR/L PagerUD:[phone] 02/29/2016, 4:25 PM

## 2016-02-29 NOTE — Progress Notes (Signed)
[facility]  Patient Status: Active (receiving services up to time of hospitalization)  [facility] is providing the following services: RN  If patient discharges after hours, please call [phone].   [name] 02/29/2016, 12:34 PM

## 2016-02-29 NOTE — Progress Notes (Signed)
Inpatient Diabetes Program Recommendations  AACE/ADA: New Consensus Statement on Inpatient Glycemic Control (2015)  Target Ranges:  Prepandial:   less than 140 mg/dL      Peak postprandial:   less than 180 mg/dL (1-2 hours)      Critically ill patients:  140 - 180 mg/dL   Lab Results  Component Value Date   GLUCAP 173 (H) 02/28/2016   HGBA1C 8.3 (H) 02/26/2016    Review of Glycemic Control  Results for [name], White (MRN [id number]) as of 02/29/2016 09:03  Ref. Range 02/26/2016 10:11 02/28/2016 06:52 02/28/2016 16:36 02/28/2016 21:29  Glucose-Capillary Latest Ref Range: 65 - 99 mg/dL 213 (H) 84 159 (H) 173 (H)    Diabetes history: Type 2 Outpatient Diabetes medications: Levemir 25 units bid, Novolog 4 units bid (1300 and 1900) Current orders for Inpatient glycemic control: Levemir  25 units bid, Novolog 0-9 units tid  Inpatient Diabetes Program Recommendations: If the patient is eating at least 50% of her meals, consider adding Novolog 3 units tid with meals- continue Novolog correction as ordered.    I anticipate the fasting blood sugar will improve with consistent bid administration of Levemir insulin.   Gentry Fitz, RN, BA, MHA, CDE Diabetes Coordinator Inpatient Diabetes Program  [phone] (Team Pager) [phone] ([place]) 02/29/2016 10:05 AM

## 2016-02-29 NOTE — Evaluation (Signed)
Physical Therapy Evaluation Patient Details Name: [name] MRN: [id number] DOB: Dec 28, 1940 Today's Date: 02/29/2016   History of Present Illness  Pt s/p Right below-the-knee popliteal artery to peroneal artery bypass with reversed greater saphenous vein and Endarterectomy of mid-segment peroneal artery (02/28/16) after having non healing wound on R foot. PMH includesL IM nailing, R ORIF toe, HTN, DM, high cholesterol  Clinical Impression  Pt admitted with above diagnosis. Pt currently with functional limitations due to the deficits listed below (see PT Problem List). PT eval limited by pt's cognitive status today with slow processing, difficulty following instructions, and distractability.  Pt will benefit from skilled PT to increase their independence and safety with mobility to allow discharge to the venue listed below.  Will continue to monitor progress during acute stay, but at this time recommend SNF.     Follow Up Recommendations SNF;Supervision/Assistance - 24 hour;Supervision for mobility/OOB    Equipment Recommendations  None recommended by PT    Recommendations for Other Services       Precautions / Restrictions Precautions Precautions: Fall Precaution Comments: Drain R LE      Mobility  Bed Mobility Overal bed mobility: Needs Assistance Bed Mobility: Supine to Sit;Sit to Supine     Supine to sit: Max assist Sit to supine: Max assist   General bed mobility comments: Pt able to A with L LE with cues.  A with trunk and for bed pad to get hips fully turned and to EOB.  Pt fearful of falling, but also confused stating "put me on the floor".   Transfers                 General transfer comment: deferred for safety  Ambulation/Gait                Stairs            Wheelchair Mobility    Modified Rankin (Stroke Patients Only)       Balance Overall balance assessment: Needs assistance   Sitting balance-Leahy Scale: Poor Sitting balance -  Comments: Poor to fair some posterior tendency                                     Pertinent Vitals/Pain Pain Assessment: Faces Faces Pain Scale: Hurts even more Pain Location: c/o catheter stinging Pain Descriptors / Indicators: Burning Pain Intervention(s): Monitored during session;Repositioned    Home Living Family/patient expects to be discharged to:: Private residence Living Arrangements: Spouse/significant other   Type of Home: House Home Access: Ramped entrance     Home Layout: One level Home Equipment: Environmental consultant - 2 wheels;Wheelchair - [employer];Hospital bed;Toilet riser;Grab bars - toilet;Shower seat;Walker - 4 wheels Additional Comments: Son reports that pt's spouse health is not optimal and would have trouble A pt.    Prior Function Level of Independence: Needs assistance   Gait / Transfers Assistance Needed: Amb with RW for short distances and w/c for longer distances           Hand Dominance   Dominant Hand: Right    Extremity/Trunk Assessment   Upper Extremity Assessment: Defer to OT evaluation           Lower Extremity Assessment: Generalized weakness;RLE deficits/detail RLE Deficits / Details: limited ROM       Communication   Communication: No difficulties  Cognition Arousal/Alertness: Lethargic;Suspect due to medications Behavior During Therapy: [facility] for tasks  assessed/performed Overall Cognitive Status: Impaired/Different from baseline Area of Impairment: Safety/judgement;Problem solving;Memory;Following commands;Attention;Awareness   Current Attention Level: Focused   Following Commands: Follows one step commands with increased time;Follows one step commands inconsistently Safety/Judgement: Decreased awareness of safety Awareness: Emergent Problem Solving: Slow processing;Difficulty sequencing;Requires verbal cues;Requires tactile cues General Comments: Pt easily distracted by lines.    General Comments  General comments (skin integrity, edema, etc.): B feet and hands appear puffy and swollen    Exercises Total Joint Exercises Ankle Circles/Pumps: AROM;5 reps      Assessment/Plan    PT Assessment Patient needs continued PT services  PT Diagnosis Generalized weakness;Difficulty walking   PT Problem List Decreased strength;Decreased activity tolerance;Decreased balance;Decreased coordination;Decreased mobility;Decreased safety awareness  PT Treatment Interventions DME instruction;Gait training;Functional mobility training;Balance training;Therapeutic exercise;Therapeutic activities;Patient/family education   PT Goals (Current goals can be found in the Care Plan section) Acute Rehab PT Goals Patient Stated Goal: Short term rehab prior to dc home PT Goal Formulation: With family Time For Goal Achievement: 03/14/16 Potential to Achieve Goals: Good    Frequency Min 3X/week   Barriers to discharge        Co-evaluation               End of Session   Activity Tolerance: Patient limited by lethargy Patient left: in bed;with call bell/phone within reach;with family/visitor present Nurse Communication: Mobility status         Time: 1212-1244 PT Time Calculation (min) (ACUTE ONLY): 32 min   Charges:   PT Evaluation $PT Eval Moderate Complexity: 1 Procedure PT Treatments $Therapeutic Activity: 8-22 mins   PT G Codes:        Jentzen Minasyan LUBECK 02/29/2016, 1:14 PM

## 2016-02-29 NOTE — Anesthesia Postprocedure Evaluation (Signed)
Anesthesia Post Note  Patient: [name]  Procedure(s) Performed: Procedure(s) (LRB): BYPASS GRAFT RIGHT BELOW KNEE POPLITEAL TO PERONEAL USING REVERSED RIGHT GREATER SAPHENOUS VEIN (Right) RIGHT GREATER SAPHENOUS VEIN HARVEST (Right)  Patient location during evaluation: PACU Anesthesia Type: General Level of consciousness: awake and alert Pain management: pain level controlled Vital Signs Assessment: post-procedure vital signs reviewed and stable Respiratory status: spontaneous breathing, nonlabored ventilation, respiratory function stable and patient connected to nasal cannula oxygen Cardiovascular status: blood pressure returned to baseline and stable Postop Assessment: no signs of nausea or vomiting Anesthetic complications: no    Last Vitals:  Vitals:   02/29/16 0335 02/29/16 0745  BP: (!) 119/56   Pulse: 86   Resp: 19   Temp: 36.7 C 37.1 C    Last Pain:  Vitals:   02/29/16 0745  TempSrc: Oral  PainSc: [name]

## 2016-03-01 ENCOUNTER — Inpatient Hospital Stay (HOSPITAL_COMMUNITY): Payer: Medicare HMO

## 2016-03-01 ENCOUNTER — Encounter (HOSPITAL_COMMUNITY): Payer: Self-pay | Admitting: Anesthesiology

## 2016-03-01 ENCOUNTER — Encounter (HOSPITAL_COMMUNITY)
Admission: RE | Disposition: A | Discharge status: Skilled Nursing Facility | Payer: Self-pay | Source: Ambulatory Visit | Attending: Neurology

## 2016-03-01 ENCOUNTER — Inpatient Hospital Stay (HOSPITAL_COMMUNITY): Payer: Medicare HMO | Admitting: Anesthesiology

## 2016-03-01 DIAGNOSIS — J9600 Acute respiratory failure, unspecified whether with hypoxia or hypercapnia: Secondary | ICD-10-CM

## 2016-03-01 DIAGNOSIS — I63032 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: Secondary | ICD-10-CM | POA: Diagnosis present

## 2016-03-01 DIAGNOSIS — I6300 Cerebral infarction due to thrombosis of unspecified precerebral artery: Secondary | ICD-10-CM | POA: Diagnosis present

## 2016-03-01 DIAGNOSIS — J9601 Acute respiratory failure with hypoxia: Secondary | ICD-10-CM

## 2016-03-01 HISTORY — PX: RADIOLOGY WITH ANESTHESIA: SHX6223

## 2016-03-01 HISTORY — PX: IR GENERIC HISTORICAL: IMG1180011

## 2016-03-01 LAB — POCT I-STAT 3, ART BLOOD GAS (G3+)
BICARBONATE: 25.1 meq/L — AB (ref 20.0–24.0)
O2 Saturation: 100 %
TCO2: 26 mmol/L (ref 0–100)
pCO2 arterial: 42.6 mmHg (ref 35.0–45.0)
pH, Arterial: 7.379 (ref 7.350–7.450)
pO2, Arterial: 268 mmHg — ABNORMAL HIGH (ref 80.0–100.0)

## 2016-03-01 LAB — CBC
HEMATOCRIT: 22.8 % — AB (ref 36.0–46.0)
HEMATOCRIT: 18.8 % — AB (ref 36.0–46.0)
HEMOGLOBIN: 7.3 g/dL — AB (ref 12.0–15.0)
Hemoglobin: 6 g/dL — CL (ref 12.0–15.0)
MCH: 27.3 pg (ref 26.0–34.0)
MCH: 27.1 pg (ref 26.0–34.0)
MCHC: 32 g/dL (ref 30.0–36.0)
MCHC: 31.9 g/dL (ref 30.0–36.0)
MCV: 85.4 fL (ref 78.0–100.0)
MCV: 85.1 fL (ref 78.0–100.0)
PLATELETS: 131 10*3/uL — AB (ref 150–400)
Platelets: 170 10*3/uL (ref 150–400)
RBC: 2.67 MIL/uL — ABNORMAL LOW (ref 3.87–5.11)
RBC: 2.21 MIL/uL — ABNORMAL LOW (ref 3.87–5.11)
RDW: 16.4 % — ABNORMAL HIGH (ref 11.5–15.5)
RDW: 16.4 % — AB (ref 11.5–15.5)
WBC: 11.7 10*3/uL — ABNORMAL HIGH (ref 4.0–10.5)
WBC: 10.8 10*3/uL — ABNORMAL HIGH (ref 4.0–10.5)

## 2016-03-01 LAB — PROCALCITONIN: PROCALCITONIN: 0.32 ng/mL

## 2016-03-01 LAB — GLUCOSE, CAPILLARY
GLUCOSE-CAPILLARY: 303 mg/dL — AB (ref 65–99)
GLUCOSE-CAPILLARY: 287 mg/dL — AB (ref 65–99)
GLUCOSE-CAPILLARY: 236 mg/dL — AB (ref 65–99)
GLUCOSE-CAPILLARY: 188 mg/dL — AB (ref 65–99)
Glucose-Capillary: 317 mg/dL — ABNORMAL HIGH (ref 65–99)
Glucose-Capillary: 291 mg/dL — ABNORMAL HIGH (ref 65–99)
Glucose-Capillary: 264 mg/dL — ABNORMAL HIGH (ref 65–99)
Glucose-Capillary: 276 mg/dL — ABNORMAL HIGH (ref 65–99)
Glucose-Capillary: 166 mg/dL — ABNORMAL HIGH (ref 65–99)
Glucose-Capillary: 151 mg/dL — ABNORMAL HIGH (ref 65–99)

## 2016-03-01 LAB — HEMOGLOBIN A1C
HEMOGLOBIN A1C: 8.1 % — AB (ref 4.8–5.6)
Mean Plasma Glucose: 186 mg/dL

## 2016-03-01 LAB — PREPARE RBC (CROSSMATCH)

## 2016-03-01 LAB — BASIC METABOLIC PANEL
ANION GAP: 6 (ref 5–15)
BUN: 24 mg/dL — ABNORMAL HIGH (ref 6–20)
CO2: 26 mmol/L (ref 22–32)
Calcium: 8.5 mg/dL — ABNORMAL LOW (ref 8.9–10.3)
Chloride: 103 mmol/L (ref 101–111)
Creatinine, Ser: 1.18 mg/dL — ABNORMAL HIGH (ref 0.44–1.00)
GFR calc Af Amer: 52 mL/min — ABNORMAL LOW (ref >60)
GFR calc non Af Amer: 45 mL/min — ABNORMAL LOW (ref >60)
GLUCOSE: 330 mg/dL — AB (ref 65–99)
POTASSIUM: 4.6 mmol/L (ref 3.5–5.1)
Sodium: 135 mmol/L (ref 135–145)

## 2016-03-01 LAB — HEMOGLOBIN AND HEMATOCRIT, BLOOD
HEMATOCRIT: 23.4 % — AB (ref 36.0–46.0)
HEMOGLOBIN: 7.4 g/dL — AB (ref 12.0–15.0)

## 2016-03-01 LAB — PLATELET INHIBITION P2Y12: PLATELET FUNCTION P2Y12: 338 [PRU] (ref 194–418)

## 2016-03-01 LAB — MAGNESIUM: Magnesium: 1.9 mg/dL (ref 1.7–2.4)

## 2016-03-01 LAB — PHOSPHORUS: Phosphorus: 2.2 mg/dL — ABNORMAL LOW (ref 2.5–4.6)

## 2016-03-01 LAB — TRIGLYCERIDES: TRIGLYCERIDES: 91 mg/dL (ref <150)

## 2016-03-01 LAB — LACTIC ACID, PLASMA: Lactic Acid, Venous: 1.9 mmol/L (ref 0.5–1.9)

## 2016-03-01 SURGERY — RADIOLOGY WITH ANESTHESIA
Anesthesia: General

## 2016-03-01 MED ORDER — CHLORHEXIDINE GLUCONATE 0.12% ORAL RINSE (MEDLINE KIT)
15.0000 mL | Freq: Two times a day (BID) | OROMUCOSAL | Status: DC
  Administered 2016-03-01 – 2016-03-05 (×8): 15 mL via OROMUCOSAL

## 2016-03-01 MED ORDER — ANTISEPTIC ORAL RINSE SOLUTION (CORINZ)
7.0000 mL | OROMUCOSAL | Status: DC
  Administered 2016-03-01 – 2016-03-05 (×40): 7 mL via OROMUCOSAL

## 2016-03-01 MED ORDER — NICARDIPINE HCL IN NACL 40-0.83 MG/200ML-% IV SOLN
3.0000 mg/h | INTRAVENOUS | Status: DC
  Filled 2016-03-01 (×2): qty 200

## 2016-03-01 MED ORDER — DIPHENHYDRAMINE HCL 50 MG/ML IJ SOLN
INTRAMUSCULAR | Status: AC
  Filled 2016-03-01: qty 1

## 2016-03-01 MED ORDER — ASPIRIN 325 MG PO TABS
325.0000 mg | ORAL_TABLET | Freq: Every day | ORAL | Status: DC
  Administered 2016-03-02 – 2016-03-13 (×12): 325 mg via ORAL
  Filled 2016-03-01 (×13): qty 1

## 2016-03-01 MED ORDER — EPHEDRINE SULFATE 50 MG/ML IJ SOLN
INTRAMUSCULAR | Status: DC | PRN
  Administered 2016-03-01: 10 mg via INTRAVENOUS

## 2016-03-01 MED ORDER — SODIUM CHLORIDE 0.9 % IV SOLN
INTRAVENOUS | Status: DC
  Filled 2016-03-01: qty 2.5

## 2016-03-01 MED ORDER — NICARDIPINE HCL IN NACL 20-0.86 MG/200ML-% IV SOLN
3.0000 mg/h | INTRAVENOUS | Status: DC
  Administered 2016-03-01: 15 mg/h via INTRAVENOUS
  Administered 2016-03-01: 5 mg/h via INTRAVENOUS
  Administered 2016-03-01 (×2): 15 mg/h via INTRAVENOUS
  Filled 2016-03-01 (×2): qty 200

## 2016-03-01 MED ORDER — CLOPIDOGREL BISULFATE 75 MG PO TABS
75.0000 mg | ORAL_TABLET | Freq: Every day | ORAL | Status: DC
  Administered 2016-03-02: 75 mg via ORAL
  Filled 2016-03-01: qty 1

## 2016-03-01 MED ORDER — IOPAMIDOL (ISOVUE-300) INJECTION 61%
INTRAVENOUS | Status: AC
  Administered 2016-03-01: 25 mL
  Filled 2016-03-01: qty 150

## 2016-03-01 MED ORDER — ENOXAPARIN SODIUM 40 MG/0.4ML ~~LOC~~ SOLN: 40.0000 mg | SUBCUTANEOUS | Status: DC

## 2016-03-01 MED ORDER — CLOPIDOGREL BISULFATE 75 MG PO TABS: 300.0000 mg | ORAL_TABLET | Freq: Once | ORAL | Status: DC

## 2016-03-01 MED ORDER — ETOMIDATE 2 MG/ML IV SOLN
INTRAVENOUS | Status: DC | PRN
  Administered 2016-03-01: 12 mg via INTRAVENOUS

## 2016-03-01 MED ORDER — STROKE: EARLY STAGES OF RECOVERY BOOK
Freq: Once | Status: DC
  Administered 2016-03-01: 20:00:00
  Filled 2016-03-01: qty 1

## 2016-03-01 MED ORDER — GLYCOPYRROLATE 0.2 MG/ML IJ SOLN
INTRAMUSCULAR | Status: DC | PRN
  Administered 2016-03-01: .4 mg via INTRAVENOUS

## 2016-03-01 MED ORDER — ALBUTEROL SULFATE (2.5 MG/3ML) 0.083% IN NEBU: 2.5000 mg | INHALATION_SOLUTION | RESPIRATORY_TRACT | Status: DC | PRN

## 2016-03-01 MED ORDER — SODIUM CHLORIDE 0.9 % IV SOLN
INTRAVENOUS | Status: DC
Start: 2016-03-01 — End: 2016-03-06
  Administered 2016-03-01: 75 mL/h via INTRAVENOUS
  Administered 2016-03-02 – 2016-03-06 (×4): via INTRAVENOUS

## 2016-03-01 MED ORDER — ASPIRIN 325 MG PO TABS: 325.0000 mg | ORAL_TABLET | Freq: Once | ORAL | Status: DC

## 2016-03-01 MED ORDER — METHYLPREDNISOLONE SODIUM SUCC 125 MG IJ SOLR
INTRAMUSCULAR | Status: AC
  Filled 2016-03-01: qty 2

## 2016-03-01 MED ORDER — VANCOMYCIN HCL 1000 MG IV SOLR
INTRAVENOUS | Status: DC | PRN
  Administered 2016-03-01: 1000 mg via INTRAVENOUS

## 2016-03-01 MED ORDER — PHENYLEPHRINE HCL 10 MG/ML IJ SOLN
INTRAVENOUS | Status: DC | PRN
  Administered 2016-03-01: 10 ug/min via INTRAVENOUS

## 2016-03-01 MED ORDER — SODIUM CHLORIDE 0.9 % IV SOLN: Freq: Once | INTRAVENOUS | Status: DC

## 2016-03-01 MED ORDER — NITROGLYCERIN 1 MG/10 ML FOR IR/CATH LAB
INTRA_ARTERIAL | Status: AC
  Filled 2016-03-01: qty 10

## 2016-03-01 MED ORDER — SODIUM CHLORIDE 0.9 % IV SOLN: INTRAVENOUS | Status: DC

## 2016-03-01 MED ORDER — ROCURONIUM BROMIDE 100 MG/10ML IV SOLN
INTRAVENOUS | Status: DC | PRN
  Administered 2016-03-01 (×2): 50 mg via INTRAVENOUS

## 2016-03-01 MED ORDER — SODIUM CHLORIDE 0.9 % IV SOLN
25.0000 ug/h | INTRAVENOUS | Status: DC
  Administered 2016-03-01: 50 ug/h via INTRAVENOUS
  Administered 2016-03-05: 25 ug/h via INTRAVENOUS
  Filled 2016-03-01: qty 50

## 2016-03-01 MED ORDER — ASPIRIN 325 MG PO TABS: 325.0000 mg | ORAL_TABLET | Freq: Every day | ORAL | Status: DC

## 2016-03-01 MED ORDER — SENNOSIDES-DOCUSATE SODIUM 8.6-50 MG PO TABS: 1.0000 | ORAL_TABLET | Freq: Every evening | ORAL | Status: DC | PRN

## 2016-03-01 MED ORDER — VANCOMYCIN HCL IN DEXTROSE 1-5 GM/200ML-% IV SOLN
INTRAVENOUS | Status: AC
  Filled 2016-03-01: qty 200

## 2016-03-01 MED ORDER — SODIUM CHLORIDE 0.9 % IV SOLN
INTRAVENOUS | Status: AC
  Administered 2016-03-01: 4.9 [IU]/h via INTRAVENOUS
  Filled 2016-03-01 (×2): qty 2.5

## 2016-03-01 MED ORDER — NICARDIPINE HCL IN NACL 20-0.86 MG/200ML-% IV SOLN
5.0000 mg/h | INTRAVENOUS | Status: DC
  Filled 2016-03-01: qty 400

## 2016-03-01 MED ORDER — ANTISEPTIC ORAL RINSE SOLUTION (CORINZ): 7.0000 mL | OROMUCOSAL | Status: DC

## 2016-03-01 MED ORDER — METHYLPREDNISOLONE SODIUM SUCC 125 MG IJ SOLR
125.0000 mg | Freq: Once | INTRAMUSCULAR | Status: AC
  Administered 2016-03-01: 125 mg via INTRAVENOUS

## 2016-03-01 MED ORDER — PROPOFOL 1000 MG/100ML IV EMUL
INTRAVENOUS | Status: AC
  Filled 2016-03-01: qty 100

## 2016-03-01 MED ORDER — SUCCINYLCHOLINE CHLORIDE 20 MG/ML IJ SOLN
INTRAMUSCULAR | Status: DC | PRN
  Administered 2016-03-01: 120 mg via INTRAVENOUS

## 2016-03-01 MED ORDER — SODIUM CHLORIDE 0.9% FLUSH
10.0000 mL | INTRAVENOUS | Status: DC | PRN
Start: 2016-03-01 — End: 2016-03-08

## 2016-03-01 MED ORDER — IOPAMIDOL (ISOVUE-300) INJECTION 61%
INTRAVENOUS | Status: AC
  Administered 2016-03-01: 75 mL
  Filled 2016-03-01: qty 50

## 2016-03-01 MED ORDER — SODIUM CHLORIDE 0.9 % IV SOLN
INTRAVENOUS | Status: DC | PRN
  Administered 2016-03-01: 11:00:00 via INTRAVENOUS

## 2016-03-01 MED ORDER — STROKE: EARLY STAGES OF RECOVERY BOOK
Freq: Once | Status: DC
  Filled 2016-03-01: qty 1

## 2016-03-01 MED ORDER — CLOPIDOGREL BISULFATE 300 MG PO TABS
ORAL_TABLET | ORAL | Status: AC
  Filled 2016-03-01: qty 1

## 2016-03-01 MED ORDER — SODIUM CHLORIDE 0.9 % IV SOLN
INTRAVENOUS | Status: DC | PRN
  Administered 2016-03-01 (×2): via INTRAVENOUS

## 2016-03-01 MED ORDER — ACETAMINOPHEN 500 MG PO TABS
1000.0000 mg | ORAL_TABLET | Freq: Four times a day (QID) | ORAL | Status: DC | PRN
  Administered 2016-03-08: 1000 mg via ORAL
  Filled 2016-03-01: qty 2

## 2016-03-01 MED ORDER — ASPIRIN 81 MG PO CHEW
CHEWABLE_TABLET | ORAL | Status: AC
  Filled 2016-03-01: qty 1

## 2016-03-01 MED ORDER — CHLORHEXIDINE GLUCONATE 0.12% ORAL RINSE (MEDLINE KIT): 15.0000 mL | Freq: Two times a day (BID) | OROMUCOSAL | Status: DC

## 2016-03-01 MED ORDER — SODIUM CHLORIDE 0.9 % IV SOLN
INTRAVENOUS | Status: AC
  Administered 2016-03-01: 6.9 [IU]/h via INTRAVENOUS

## 2016-03-01 MED ORDER — PRO-STAT SUGAR FREE PO LIQD
30.0000 mL | Freq: Two times a day (BID) | ORAL | Status: DC
  Administered 2016-03-01 – 2016-03-06 (×10): 30 mL
  Filled 2016-03-01 (×10): qty 30

## 2016-03-01 MED ORDER — FENTANYL CITRATE (PF) 100 MCG/2ML IJ SOLN
50.0000 ug | Freq: Once | INTRAMUSCULAR | Status: AC
  Administered 2016-03-01: 50 ug via INTRAVENOUS
  Filled 2016-03-01: qty 2

## 2016-03-01 MED ORDER — FAMOTIDINE IN NACL 20-0.9 MG/50ML-% IV SOLN
20.0000 mg | Freq: Two times a day (BID) | INTRAVENOUS | Status: DC
Start: 2016-03-01 — End: 2016-03-06
  Administered 2016-03-01 – 2016-03-05 (×10): 20 mg via INTRAVENOUS
  Filled 2016-03-01 (×11): qty 50

## 2016-03-01 MED ORDER — ACETAMINOPHEN 650 MG RE SUPP: 650.0000 mg | Freq: Four times a day (QID) | RECTAL | Status: DC | PRN

## 2016-03-01 MED ORDER — PROPOFOL 1000 MG/100ML IV EMUL
0.0000 ug/kg/min | INTRAVENOUS | Status: DC
  Administered 2016-03-01: 25 ug/kg/min via INTRAVENOUS
  Administered 2016-03-01: 12.5 ug/kg/min via INTRAVENOUS
  Administered 2016-03-03 (×2): 10 ug/kg/min via INTRAVENOUS
  Administered 2016-03-05: 5 ug/kg/min via INTRAVENOUS
  Filled 2016-03-01 (×7): qty 100

## 2016-03-01 MED ORDER — ONDANSETRON HCL 4 MG/2ML IJ SOLN: 4.0000 mg | Freq: Four times a day (QID) | INTRAMUSCULAR | Status: DC | PRN

## 2016-03-01 MED ORDER — HEPARIN SODIUM (PORCINE) 1000 UNIT/ML IJ SOLN
INTRAMUSCULAR | Status: DC | PRN
  Administered 2016-03-01: 2000 [IU] via INTRAVENOUS

## 2016-03-01 MED ORDER — SODIUM CHLORIDE 0.9% FLUSH
10.0000 mL | INTRAVENOUS | Status: DC | PRN
  Administered 2016-03-03: 20 mL
  Administered 2016-03-07: 10 mL
  Filled 2016-03-01 (×2): qty 40

## 2016-03-01 MED ORDER — DIPHENHYDRAMINE HCL 50 MG/ML IJ SOLN
50.0000 mg | Freq: Once | INTRAMUSCULAR | Status: AC
  Administered 2016-03-01: 50 mg via INTRAVENOUS

## 2016-03-01 MED ORDER — VITAL HIGH PROTEIN PO LIQD: 1000.0000 mL | ORAL | Status: DC

## 2016-03-01 MED ORDER — FENTANYL BOLUS VIA INFUSION
25.0000 ug | INTRAVENOUS | Status: DC | PRN
  Filled 2016-03-01: qty 25

## 2016-03-01 MED ORDER — EPTIFIBATIDE 20 MG/10ML IV SOLN
INTRAVENOUS | Status: AC
  Administered 2016-03-01: 1.5 mg
  Administered 2016-03-01: 14:00:00
  Administered 2016-03-01: 1.5 mg
  Filled 2016-03-01: qty 10

## 2016-03-01 MED ORDER — DOCUSATE SODIUM 50 MG/5ML PO LIQD: 100.0000 mg | Freq: Two times a day (BID) | ORAL | Status: DC | PRN

## 2016-03-01 MED ORDER — IOPAMIDOL (ISOVUE-300) INJECTION 61%
INTRAVENOUS | Status: AC
  Administered 2016-03-01: 50 mL
  Filled 2016-03-01: qty 150

## 2016-03-01 MED ORDER — IOPAMIDOL (ISOVUE-370) INJECTION 76%
INTRAVENOUS | Status: AC
Start: 2016-03-01 — End: 2016-03-01
  Administered 2016-03-01: 100 mL
  Filled 2016-03-01: qty 100

## 2016-03-01 NOTE — Consult Note (Signed)
PULMONARY / CRITICAL CARE MEDICINE   Name: [name] MRN: [id number] DOB: 1941-07-26    ADMISSION DATE:  02/28/2016 CONSULTATION DATE:  03/01/16  REFERRING MD:  Dr. Leonie Man / Stroke MD   CHIEF COMPLAINT:  CVA, Acute Respiratory Failure   HISTORY OF PRESENT ILLNESS:  75 y/o M with PMH of DM, diabetic neuropathy, GERD, HLD, HTN, aortic stenosis who was admitted on 7/26 for right popliteal bypass graft in the setting of chronic right lower extremity wound.  Angiography in 12/2015 revealed bilateral extensive tibial disease.  The patient was initially recommended amputation however, she refused.  On 7/26 she underwent a right below the knee popliteal artery to peroneal artery bypass. Postoperatively she was treated with heparin and transitioned to Plavix on 7/27. JP drain postoperatively had high output. She was evaluated by physical therapy and recommended for skilled nursing facility placement. Postoperatively there were concern for weak doppler flow. On 7/28, she was noted to have worsening of peroneal signal consistent with early failure and anemia requiring transfusion of 2 units PRBCs. Palliative amputation was recommended.  On re-rounding, the patient was noted to have altered mental status with right-sided weakness and left gaze. Code Stroke called for further evaluation.  Upon*team evaluation, she was noted to have incomprehensible speech and moaning as well as right hemiplegia. She was premedicated with Solu-Medrol and Benadryl for imaging given her history of iodine allergy. Imaging delayed due to difficulty obtaining IV access. She was emergently taken to neuro-interventional radiology where she underwent procedure for revascularization (stent assisted angioplasty) of her left ICA in the setting of thrombus.  The patient was intubated and returned to ICU for further observation.  PCCM consulted for ICU assistance  PAST MEDICAL HISTORY :  She  has a past medical history of Aortic stenosis;  Complication of anesthesia; Diabetes mellitus without complication ([place]); Diabetic neuropathy ([place]); Edema of both legs; GERD (gastroesophageal reflux disease); High cholesterol; HTN (hypertension); Hypertension; Shortness of breath dyspnea; Spasm of back muscles; and Wound, open.  PAST SURGICAL HISTORY: She  has a past surgical history that includes Intramedullary (im) nail intertrochanteric (Left, 02/04/2014); ORIF toe fracture (Right, 02/08/2014); Cardiac catheterization (N/A, 12/28/2015); Cyst excision; Eye surgery (Bilateral); Bypass graft popliteal to tibial (Right, 02/28/2016); and Vein harvest (Right, 02/28/2016).  Allergies  Allergen Reactions  . Iodine Anaphylaxis  . Penicillins Anaphylaxis  . Shellfish Allergy Anaphylaxis  . Sulfa Antibiotics Anaphylaxis    No current facility-administered medications on file prior to encounter.    Current Outpatient Prescriptions on File Prior to Encounter  Medication Sig  . acetaminophen (TYLENOL) 500 MG tablet Take 500 mg by mouth every 6 (six) hours as needed for mild pain.  [name] aspirin EC 81 MG tablet Take 81 mg by mouth daily.   [name] atorvastatin (LIPITOR) 20 MG tablet Take 20 mg by mouth daily.   . carvedilol (COREG) 25 MG tablet Take 25 mg by mouth 2 (two) times daily with a meal.   . cetirizine (ZYRTEC) 10 MG tablet Take 10 mg by mouth daily as needed for allergies.  [name] docusate sodium (COLACE) 100 MG capsule Take 100 mg by mouth 2 (two) times daily.  . fluticasone (FLONASE) 50 MCG/ACT nasal spray Place 1 spray into both nostrils daily.  . furosemide (LASIX) 40 MG tablet Take 80 mg by mouth 2 (two) times daily.   . insulin aspart (NOVOLOG) 100 UNIT/ML injection Inject 0-15 Units into the skin 3 (three) times daily with meals. Before each meal 3 times a day,  140-199 - 3 units, 200-250 - 5 units, 251-299 - 8 units,  300-349 - 12 units,  350 or above 15 units. (Patient taking differently: Inject 4 Units into the skin 2 (two) times daily. )  . insulin  detemir (LEVEMIR) 100 UNIT/ML injection Inject 25 Units into the skin 2 (two) times daily.   [name] losartan (COZAAR) 100 MG tablet Take 1 tablet (100 mg total) by mouth daily.  [name] omeprazole (PRILOSEC) 20 MG capsule Take 20 mg by mouth daily.  . polyethylene glycol (MIRALAX / GLYCOLAX) packet Take 17 g by mouth 2 (two) times daily.  . potassium chloride SA (K-DUR,KLOR-CON) 20 MEQ tablet Take 20 mEq by mouth daily.  . pregabalin (LYRICA) 50 MG capsule Take 50 mg by mouth 3 (three) times daily.  . B-D INS SYRINGE 0.5CC/31GX5/16 31G X 5/16" 0.5 ML MISC     FAMILY HISTORY:  Her indicated that the status of her other is unknown.    SOCIAL HISTORY: She  reports that she has never smoked. She has never used smokeless tobacco. She reports that she does not drink alcohol or use drugs.  REVIEW OF SYSTEMS:  Unable to complete due to mechanical ventilation  SUBJECTIVE:    VITAL SIGNS: BP (!) 127/53 (BP Location: Left Arm)   Pulse 84   Temp 98.9 F (37.2 C) (Axillary)   Resp (!) 22   Ht 5\' 5"  (1.651 m)   Wt 243 lb 6.2 oz (110.4 kg)   SpO2 100%   BMI 40.50 kg/m   HEMODYNAMICS:    VENTILATOR SETTINGS: Vent Mode: PRVC FiO2 (%):  [100 %] 100 % Set Rate:  [14 bmp-18 bmp] 18 bmp Vt Set:  [450 mL-500 mL] 450 mL PEEP:  [5 cmH20-8 cmH20] 5 cmH20 Plateau Pressure:  [24 cmH20-31 cmH20] 24 cmH20  INTAKE / OUTPUT: I/O last 3 completed shifts: In: 965 [P.O.:960; Other:5] Out: 20 [Urine:1500; Drains:200]  PHYSICAL EXAMINATION: General:  Obese female in NAD Neuro:  Sedate on vent HEENT:  ETT, MM pink/moist, unable to assess JVD  Cardiovascular:  s1s2 rrr, no m/r/g Lungs:  Even/non-labored, diminished on L, clear on R Abdomen:  Obese/soft, bsx4 active  Musculoskeletal:  No acute deformities  Skin:  RLE surgical incision c/d/i, JP drain intact with serosanguinous drainage, first digit wrapped in gauze  LABS:  BMET  Recent Labs Lab 02/26/16 1104  02/28/16 1547 02/29/16 0041  03/01/16 0455  NA 140  < > 141 135 135  K 4.4  < > 4.1 5.2* 4.6  CL 106  --   --  104 103  CO2 27  --   --  24 26  BUN 20  --   --  16 24*  CREATININE 0.84  --   --  0.76 1.18*  GLUCOSE 160*  < > 116* 218* 330*  < > = values in this interval not displayed.  Electrolytes  Recent Labs Lab 02/26/16 1104 02/29/16 0041 03/01/16 0455  CALCIUM 9.7 8.2* 8.5*    CBC  Recent Labs Lab 02/26/16 1104  02/28/16 1547 02/29/16 0042 03/01/16 0455  WBC 9.0  --   --  15.3* 11.7*  HGB 12.2  < > 9.9* 8.9* 7.3*  HCT 39.6  < > 29.0* 28.1* 22.8*  PLT 188  --   --  157 170  < > = values in this interval not displayed.  Coag's  Recent Labs Lab 02/26/16 1104  APTT 31  INR 1.20    Sepsis Markers No results for  input(s): LATICACIDVEN, PROCALCITON, O2SATVEN in the last 168 hours.  ABG No results for input(s): PHART, PCO2ART, PO2ART in the last 168 hours.  Liver Enzymes  Recent Labs Lab 02/26/16 1104  AST 28  ALT 38  ALKPHOS 61  BILITOT 0.5  ALBUMIN 3.5    Cardiac Enzymes No results for input(s): TROPONINI, PROBNP in the last 168 hours.  Glucose  Recent Labs Lab 02/29/16 1138 02/29/16 1716 02/29/16 2059 03/01/16 0819 03/01/16 1159 03/01/16 1352  GLUCAP 208* 268* 323* 317* 303* 287*    Imaging Ct Angio Head W Or Wo Contrast  Result Date: 03/01/2016 CLINICAL DATA:  Right-sided weakness and aphasia. Femoral popliteal bypass performed yesterday. EXAM: CT ANGIOGRAPHY HEAD AND NECK.  CT perfusion. TECHNIQUE: Multidetector CT imaging of the head and neck was performed using the standard protocol during bolus administration of intravenous contrast. Multiplanar CT image reconstructions and MIPs were obtained to evaluate the vascular anatomy. Carotid stenosis measurements (when applicable) are obtained utilizing NASCET criteria, using the distal internal carotid diameter as the denominator. CONTRAST:  100 cc Isovue 370 COMPARISON:  CT earlier same day. FINDINGS: CTA NECK  Aortic arch: Mild atherosclerosis.  No aneurysm or dissection. Right carotid system: Common carotid artery is patent to the bifurcation. Calcified plaque along the medial margin of the distal CCA but no stenosis. Carotid bifurcation widely patent without stenosis of the ICA. Cervical ICA widely patent. Left carotid system: Left common carotid artery widely patent to the bifurcation. Soft plaque at the carotid bifurcation with near occlusion at the left ICA origin. Persistent antegrade flow within the left ICA which is markedly narrowed because of reduced inflow. No ECA stenosis. Vertebral arteries:Both vertebral artery origins widely patent. Both vertebral arteries are widely patent through the cervical region. Skeleton: Ordinary spondylosis Other neck: Intubated. No significant soft tissue lesion in the neck. Lung apices are clear. CTA HEAD Anterior circulation: Both internal carotid arteries are patent through the skullbase. There is extensive atherosclerotic calcification in both carotid siphon regions. Stenosis is estimated at 50% in both carotid siphon regions. Supra clinoid internal carotid arteries are also narrowed 50%. On the right, the anterior and middle cerebral arteries are widely patent. Diminutive A1 segment on the left with both anterior cerebral arteries receiving most of there supply from the right carotid circulation. On the left, there is no M1 stenosis or missing M2 branch identified. Patent posterior communicating artery on the left. Posterior circulation: Both vertebral arteries are patent through the foramen magnum to the basilar. Posterior inferior cerebellar arteries are patent. No basilar stenosis. Superior cerebellar and posterior cerebral arteries are patent bilaterally. Right PCA stenosis 1 cm beyond its origin, 50-70%. Venous sinuses: Patent and normal Anatomic variants: Diminutive A1 segment on the left as noted above. Delayed phase: 1 cm meningioma arising from the posterior fossa  floor on the right without significant mass effect. CT perfusion: Cerebral blood flow is normal to the right hemisphere in the visualized posterior fossa. There is diminished cerebral blood flow throughout the left MCA territory. Mean transit time is delayed throughout the left MCA territory. Cerebral blood volume remains normal. Time to peak is delayed throughout the left MCA territory. At risk/penumbra brain throughout the left MCA territory. No sign of completed infarction. 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 Reduced caliber of the left cervical internal carotid artery because of reduced inflow. 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 Stenoses estimated at 50% bilaterally. Probable serial stenoses. No M1  stenosis and no missing M2 branches on the left. At risk brain in the left MCA territory showing normal cerebral blood volume but decreased cerebral blood flow, mean transit time and time to peak. No evidence of completed infarction. These results were called by telephone at the time of interpretation on 03/01/2016 at 10:27 am to Dr. Elson Clan , who verbally acknowledged these results. Electronically Signed   By: Nelson Chimes M.D.   On: 03/01/2016 10:54  Ct Angio Neck W Or Wo Contrast  Result Date: 03/01/2016 CLINICAL DATA:  Right-sided weakness and aphasia. Femoral popliteal bypass performed yesterday. EXAM: CT ANGIOGRAPHY HEAD AND NECK.  CT perfusion. TECHNIQUE: Multidetector CT imaging of the head and neck was performed using the standard protocol during bolus administration of intravenous contrast. Multiplanar CT image reconstructions and MIPs were obtained to evaluate the vascular anatomy. Carotid stenosis measurements (when applicable) are obtained utilizing NASCET criteria, using the distal internal carotid diameter as the denominator. CONTRAST:  100 cc Isovue 370 COMPARISON:  CT earlier same day. FINDINGS: CTA NECK  Aortic arch: Mild atherosclerosis.  No aneurysm or dissection. Right carotid system: Common carotid artery is patent to the bifurcation. Calcified plaque along the medial margin of the distal CCA but no stenosis. Carotid bifurcation widely patent without stenosis of the ICA. Cervical ICA widely patent. Left carotid system: Left common carotid artery widely patent to the bifurcation. Soft plaque at the carotid bifurcation with near occlusion at the left ICA origin. Persistent antegrade flow within the left ICA which is markedly narrowed because of reduced inflow. No ECA stenosis. Vertebral arteries:Both vertebral artery origins widely patent. Both vertebral arteries are widely patent through the cervical region. Skeleton: Ordinary spondylosis Other neck: Intubated. No significant soft tissue lesion in the neck. Lung apices are clear. CTA HEAD Anterior circulation: Both internal carotid arteries are patent through the skullbase. There is extensive atherosclerotic calcification in both carotid siphon regions. Stenosis is estimated at 50% in both carotid siphon regions. Supra clinoid internal carotid arteries are also narrowed 50%. On the right, the anterior and middle cerebral arteries are widely patent. Diminutive A1 segment on the left with both anterior cerebral arteries receiving most of there supply from the right carotid circulation. On the left, there is no M1 stenosis or missing M2 branch identified. Patent posterior communicating artery on the left. Posterior circulation: Both vertebral arteries are patent through the foramen magnum to the basilar. Posterior inferior cerebellar arteries are patent. No basilar stenosis. Superior cerebellar and posterior cerebral arteries are patent bilaterally. Right PCA stenosis 1 cm beyond its origin, 50-70%. Venous sinuses: Patent and normal Anatomic variants: Diminutive A1 segment on the left as noted above. Delayed phase: 1 cm meningioma arising from the posterior fossa  floor on the right without significant mass effect. CT perfusion: Cerebral blood flow is normal to the right hemisphere in the visualized posterior fossa. There is diminished cerebral blood flow throughout the left MCA territory. Mean transit time is delayed throughout the left MCA territory. Cerebral blood volume remains normal. Time to peak is delayed throughout the left MCA territory. At risk/penumbra brain throughout the left MCA territory. No sign of completed infarction. 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 Reduced caliber of the left cervical internal carotid artery because of reduced inflow. 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 Stenoses estimated at 50% bilaterally. Probable serial stenoses. No M1 stenosis and no missing M2 branches on the left. At risk brain in  the left MCA territory showing normal cerebral blood volume but decreased cerebral blood flow, mean transit time and time to peak. No evidence of completed infarction. These results were called by telephone at the time of interpretation on 03/01/2016 at 10:27 am to Dr. Elson Clan , who verbally acknowledged these results. Electronically Signed   By: Nelson Chimes M.D.   On: 03/01/2016 10:54  Ct Cerebral Perfusion W Contrast  Result Date: 03/01/2016 CLINICAL DATA:  Right-sided weakness and aphasia. Femoral popliteal bypass performed yesterday. EXAM: CT ANGIOGRAPHY HEAD AND NECK.  CT perfusion. TECHNIQUE: Multidetector CT imaging of the head and neck was performed using the standard protocol during bolus administration of intravenous contrast. Multiplanar CT image reconstructions and MIPs were obtained to evaluate the vascular anatomy. Carotid stenosis measurements (when applicable) are obtained utilizing NASCET criteria, using the distal internal carotid diameter as the denominator. CONTRAST:  100 cc Isovue 370 COMPARISON:  CT earlier same day. FINDINGS: CTA NECK  Aortic arch: Mild atherosclerosis.  No aneurysm or dissection. Right carotid system: Common carotid artery is patent to the bifurcation. Calcified plaque along the medial margin of the distal CCA but no stenosis. Carotid bifurcation widely patent without stenosis of the ICA. Cervical ICA widely patent. Left carotid system: Left common carotid artery widely patent to the bifurcation. Soft plaque at the carotid bifurcation with near occlusion at the left ICA origin. Persistent antegrade flow within the left ICA which is markedly narrowed because of reduced inflow. No ECA stenosis. Vertebral arteries:Both vertebral artery origins widely patent. Both vertebral arteries are widely patent through the cervical region. Skeleton: Ordinary spondylosis Other neck: Intubated. No significant soft tissue lesion in the neck. Lung apices are clear. CTA HEAD Anterior circulation: Both internal carotid arteries are patent through the skullbase. There is extensive atherosclerotic calcification in both carotid siphon regions. Stenosis is estimated at 50% in both carotid siphon regions. Supra clinoid internal carotid arteries are also narrowed 50%. On the right, the anterior and middle cerebral arteries are widely patent. Diminutive A1 segment on the left with both anterior cerebral arteries receiving most of there supply from the right carotid circulation. On the left, there is no M1 stenosis or missing M2 branch identified. Patent posterior communicating artery on the left. Posterior circulation: Both vertebral arteries are patent through the foramen magnum to the basilar. Posterior inferior cerebellar arteries are patent. No basilar stenosis. Superior cerebellar and posterior cerebral arteries are patent bilaterally. Right PCA stenosis 1 cm beyond its origin, 50-70%. Venous sinuses: Patent and normal Anatomic variants: Diminutive A1 segment on the left as noted above. Delayed phase: 1 cm meningioma arising from the posterior fossa  floor on the right without significant mass effect. CT perfusion: Cerebral blood flow is normal to the right hemisphere in the visualized posterior fossa. There is diminished cerebral blood flow throughout the left MCA territory. Mean transit time is delayed throughout the left MCA territory. Cerebral blood volume remains normal. Time to peak is delayed throughout the left MCA territory. At risk/penumbra brain throughout the left MCA territory. No sign of completed infarction. 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 Reduced caliber of the left cervical internal carotid artery because of reduced inflow. 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 Stenoses estimated at 50% bilaterally. Probable serial stenoses. No M1 stenosis and no missing M2 branches on the left. At risk brain in the left MCA territory showing normal cerebral blood volume but decreased cerebral blood flow, mean  transit time and time to peak. No evidence of completed infarction. These results were called by telephone at the time of interpretation on 03/01/2016 at 10:27 am to Dr. Elson Clan , who verbally acknowledged these results. Electronically Signed   By: Nelson Chimes M.D.   On: 03/01/2016 10:54  Ct Head Code Stroke Wo Contrast  Result Date: 03/01/2016 CLINICAL DATA:  Right-sided hemiplegia and aphasia EXAM: CT HEAD WITHOUT CONTRAST TECHNIQUE: Contiguous axial images were obtained from the base of the skull through the vertex without intravenous contrast. COMPARISON:  February 12, 2014 FINDINGS: There is age related volume loss. There is no appreciable intracranial mass, hemorrhage, extra-axial fluid collection, or midline shift. There is stable mild small vessel disease in the centra semiovale bilaterally. No acute infarct is evident. No hyperdense vessel is evident on this study. The middle cerebral artery attenuation appear symmetric bilaterally. There are scattered  foci of calcification in the distal vertebral arteries. There is also calcification in the cavernous carotid artery an carotid siphon regions. The bony calvarium appears intact. The mastoid air cells are clear. Visualized paranasal sinuses are clear. Orbits appear symmetric bilaterally. IMPRESSION: Age related volume loss with mild periventricular small vessel disease. No acute infarct evident currently. No edema or hemorrhage. No hyperdense vessel evident. Basal ganglia calcification is physiologic. Critical Value/emergent results were called by telephone at the time of interpretation on 03/01/2016 at 8:49 am to Dr. Elson Clan , who verbally acknowledged these results. Electronically Signed   By: Lowella Grip III M.D.   On: 03/01/2016 08:49    STUDIES:  CTA Head / Neck 7/28 >> At risk brain in the L MCA territory showing normal cerebral blood volume but decreased cerebral flow CT Cerebral Perfusion 7/28 >> near occlusion of the L ICA at the origin  CULTURES:   ANTIBIOTICS:   SIGNIFICANT EVENTS: 7/26  Admit for R popliteal artery to peroneal artery bypass 7/28  Code Stroke called for R sided weakness, to neuro IR with near occlusion of L ICA s/p revascularization     LINES/TUBES: ETT 7/28 >>  R IJ Dual Lumen 7/28 >>   DISCUSSION: 75 y/o F with a PMH of DM, HTN & PVD admitted 7/26 for planned right popliteal artery to peroneal artery bypass.  Developed AMS, right sided weakness on 7/28 and found to have occlusion of L ICA s/p neuro revascularization per IR.  Returned to ICU on mechanical ventilation post-procedure.    ASSESSMENT / PLAN:  NEUROLOGIC A:   L ICA CVA s/p Revascularization - 7/28 P:   RASS goal: -2 Propofol for sedation  Fentanyl gtt for pain  Serial neuro exams Defer further imaging to Stroke Service   PULMONARY A: Acute respiratory failure - the setting of left ICA CVA P:   PRVC 8 cc/kg Wean PEEP / FiO2 for sats > 92% PRN albuterol for wheezing  ABG  one hour after arrival to unit  Now CXR for ETT, central line placement  Daily SBT / WUA once OK with neuro   CARDIOVASCULAR A:  Hypertension  HLD  RLE Popliteal Artery to Peroneal Artery Bypass - completed 7/26 PVD  P:  SBP goal 120-130 / parameters per Neuro  Aline monitoring of BP  Continue oral agents > coreg, lasix, losartan Cardene gtt for above goal  Assess ECHO   RENAL A:   At Risk AKI - in setting of HTN P:   Trend BMP / UOP  Replace electrolytes as indicated  Lasix as above  GASTROINTESTINAL A:   Obesity  P:   Begin TF per Nutrition > discussed all TF formularies have iodine in them - monitor for swelling / anaphylaxis symptoms  NPO  Place OGT  Pepcid for SUP   HEMATOLOGIC A:   Anemia - s/p 2 units PRBC's 7/27 P:  Monitor CBC, repeat now  SCD on LLE for DVT  Consider heparin SQ for DVT when ok per Neuro  ASA + Plavix per Neurology   INFECTIOUS A:   No evidence of acute infectious etiology P:   Monitor fever curve / WBC   ENDOCRINE A:   DM  Hyperglycemia   P:   ICU hyperglycemia protocol  Continue insulin gtt     FAMILY  - Updates: No family at bedside 7/28.  - Inter-disciplinary family meet or Palliative Care meeting due by:  8/4    Noe Gens, NP-C [facility] Pgr: [phone] or if no answer [phone] 03/01/2016, 3:28 PM   ATTENDING NOTE / ATTESTATION NOTE :   I have discussed the case with the resident/APP Noe Gens.   I agree with the resident/APP's  history, physical examination, assessment, and plans.    I have edited the above note and modified it according to our agreed history, physical examination, assessment and plan.   Briefly, 75 y/o F with PMH of DM, diabetic neuropathy, GERD, HLD, HTN, aortic stenosis who was admitted on 7/26 for right popliteal bypass graft in the setting of chronic right lower extremity wound. She refused amputation. Pt had bleeding and anemia post bypass.  On 7/28,   patient was noted to have altered mental status with right-sided weakness and left gaze. Code Stroke called for further evaluation.  Upon*team evaluation, she was noted to have incomprehensible speech and moaning as well as right hemiplegia. She was premedicated with Solu-Medrol and Benadryl for imaging given her history of iodine allergy. Imaging delayed due to difficulty obtaining IV access. She was emergently taken to neuro-interventional radiology where she underwent procedure for revascularization (stent assisted angioplasty) of her left ICA in the setting of thrombus.  The patient was intubated and returned to ICU for further observation.  Patient was seen, intubated, sedated. Blood pressure [id number] systolic. Heart rate 90-100. Comfortable. No neck vein distention. Some crackles at the bases. Tachycardic. Dec BS. RLE with drain. Gr 2 edema. Cool distal extremities.   Pt is S/P  L ICA CVA s/p Revascularization by [facility]. Management per neuroradiology. Keep BP 110-130. On cardene drip.   Pt with respiratory failure secondary to unable to protect airway. Continue ventilatory support for now.  No evidence for infection. Will observe. Check MRSA screen trache asp, lactate, PCT.   I have spent 35  minutes of critical care time with this patient today.  Family :Family updated at length today.  Spoke to husband and daughter.    Monica Becton, MD 03/01/2016, 8:02 PM [facility] Pager [phone] After 3 pm or if no answer, call [phone]

## 2016-03-01 NOTE — Transfer of Care (Signed)
Immediate Anesthesia Transfer of Care Note  Patient: [name]  Procedure(s) Performed: Procedure(s): RADIOLOGY WITH ANESTHESIA (N/A)  Patient Location: NICU  Anesthesia Type:General  Level of Consciousness: sedated and Patient remains intubated per anesthesia plan  Airway & Oxygen Therapy: Patient remains intubated per anesthesia plan and Patient placed on Ventilator (see vital sign flow sheet for setting)  Post-op Assessment: Report given to RN and Post -op Vital signs reviewed and stable  Post vital signs: Reviewed and stable  Last Vitals:  Vitals:   03/01/16 0830 03/01/16 1529  BP: (!) 127/53   Pulse: 84   Resp: (!) 22   Temp: 37.2 C 36.5 C    Last Pain:  Vitals:   03/01/16 1529  TempSrc: Axillary  PainSc:       Patients Stated Pain Goal: 3 ([id number])  Complications: No apparent anesthesia complications

## 2016-03-01 NOTE — Progress Notes (Addendum)
   Daily Progress Note  Assessment/Planning: POD #2 s/p R pop-peroneal bypass   Further attenuation of peroneal signal today, likely consistent with early failure  Given the poor status of her tibial runoff, I.e. Calcific atherosclerosis occluding the lumen, no further intervention is possible except for palliative amputation  Awaiting formal ABI  Pt sleep this AM, will need to see later to determine pt's sx with right foot  JP removal today as minimal output  H/H drop.  Recheck, if reproducible, transfuse 2 u pRBC  K better this AM after extra dose of Lasix  Subjective  - 2 Days Post-Op  Sleepy, wakes briefly then falls asleep   Objective Vitals:   02/29/16 2313 02/29/16 2320 03/01/16 0325 03/01/16 0715  BP: (!) 102/50 (!) 108/51 105/65 (!) 126/46  Pulse: 82 79 76 85  Resp: 19 (!) 22 20 (!) 21  Temp: 98.7 F (37.1 C)  98.9 F (37.2 C)   TempSrc: Oral  Oral   SpO2: 98% 95% 92% 95%  Weight:      Height:        Intake/Output Summary (Last 24 hours) at 03/01/16 0735 Last data filed at 03/01/16 0500  Gross per 24 hour  Intake                5 ml  Output             1300 ml  Net            -1295 ml    PULM  BLL rales CV  RRR GI  soft, NTND VASC  R foot viable, slightly more pallorous, peroneal/AT signal is weak this AM  Laboratory CBC    Component Value Date/Time   WBC 11.7 (H) 03/01/2016 0455   HGB 7.3 (L) 03/01/2016 0455   HCT 22.8 (L) 03/01/2016 0455   PLT 170 03/01/2016 0455    BMET    Component Value Date/Time   NA 135 03/01/2016 0455   K 4.6 03/01/2016 0455   CL 103 03/01/2016 0455   CO2 26 03/01/2016 0455   GLUCOSE 330 (H) 03/01/2016 0455   BUN 24 (H) 03/01/2016 0455   CREATININE 1.18 (H) 03/01/2016 0455   CALCIUM 8.5 (L) 03/01/2016 0455   GFRNONAA 45 (L) 03/01/2016 0455   GFRAA 52 (L) 03/01/2016 0455    Adele Barthel, MD [facility] Office: [phone] Pager: [phone]  03/01/2016, 7:35 AM

## 2016-03-01 NOTE — Progress Notes (Signed)
Vent settings per CCM.  Unit RT aware of pt.  No complications noted during transport to unit.  VSS.

## 2016-03-01 NOTE — Anesthesia Postprocedure Evaluation (Signed)
Anesthesia Post Note  Patient: [name]  Procedure(s) Performed: Procedure(s) (LRB): RADIOLOGY WITH ANESTHESIA (N/A)  Patient location during evaluation: NICU Anesthesia Type: General Level of consciousness: awake, awake and alert and oriented Pain management: pain level controlled Vital Signs Assessment: post-procedure vital signs reviewed and stable Respiratory status: spontaneous breathing, nonlabored ventilation and respiratory function stable Cardiovascular status: blood pressure returned to baseline Anesthetic complications: no    Last Vitals:  Vitals:   03/01/16 1600 03/01/16 1615  BP:  (!) 110/42  Pulse: 66 64  Resp: 18 18  Temp:      Last Pain:  Vitals:   03/01/16 1529  TempSrc: Axillary  PainSc:                  Willie Loy COKER

## 2016-03-01 NOTE — Code Documentation (Signed)
75yo female who underwent right bypass graft popliteal to peroneal on 02/28/2016.  Patient was reportedly confused overnight and on morning assessment was noted to have right sided weakness and incomprehensible speech at 0800.  Unknown LKW.  Code stroke activated.  Stroke team to the bedside.  Patient transported to CT with bedside RN, Stroke RN and neurologist.  CT completed.  NIHSS 22, see documentation for details and code stroke times.  Patient with right hemiplegia and unable to follow commands on exam.  Patient moaning with incomprehensible speech at times.  Order for CTA and CTP.  Difficulty placing necessary PIV despite multiple attempts by providers.  IV team notified and to CT to place PIV., midline catheter placed.  Patient with Iodine allergy, MD aware.  Solumedrol and Benadryl IV given, see MAR.  Patient subsequently required intubation by anesthesia per MD.  Significant delay in imaging d/t difficulty placing PIV, contrast allergy requiring pre-meds, and intubation.  CTA and CTP completed.  IR notified.  Patient transported to IR with team.  Handoff with IR team.

## 2016-03-01 NOTE — Progress Notes (Signed)
While making morning rounds, PA noticed change in pt status from yesterday. Code stroke was called immediately and pt sent to CT.  Rapid response, nurse and Neurology at bedside. VSS. Respiratory status began declining before CTA and pt was intubated via anesthesia and transferred to IR. Report given to nurse in IR and nurse on 76M.

## 2016-03-01 NOTE — Progress Notes (Signed)
PT Cancellation Note  Patient Details Name: [name] MRN: [id number] DOB: Sep 06, 1940   Cancelled Treatment:    Reason Eval/Treat Not Completed: Medical issues which prohibited therapy.  Code Stroke called this AM.  Workup in progress.  PT to check back on Monday 03/04/16.    Thanks,    Barbarann Ehlers. Doctor Phillips, [place], DPT [phone]   03/01/2016, 10:04 AM

## 2016-03-01 NOTE — Care Management Note (Addendum)
Case Management Note  Patient Details  Name: [name] MRN: [id number] Date of Birth: Sep 30, 1940  Subjective/Objective:   Patient is from home with spouse, presents with PAD, today has mental status changes, code stroke called, patient is flaccid on left, patient to transfer to icu. Pt/OT eval rec SNF, NCM will cont to follow for dc needs.                  Action/Plan:   Expected Discharge Date:                  Expected Discharge Plan:  Skilled Nursing Facility  In-House Referral:  Clinical Social Work  Discharge planning Services  CM Consult  Post Acute Care Choice:    Choice offered to:     DME Arranged:    DME Agency:     HH Arranged:    [place] Agency:     Status of Service:  Completed, signed off  If discussed at [employer], dates discussed:    Additional Comments:  Zenon Mayo, RN 03/01/2016, 11:15 AM

## 2016-03-01 NOTE — Anesthesia Procedure Notes (Signed)
Procedure Name: Intubation Date/Time: 03/01/2016 11:19 AM Performed by: Maude Leriche D Pre-anesthesia Checklist: Patient identified, Emergency Drugs available, Suction available, Patient being monitored and Timeout performed Patient Re-evaluated:Patient Re-evaluated prior to inductionOxygen Delivery Method: Circle system utilized Preoxygenation: Pre-oxygenation with 100% oxygen Intubation Type: IV induction, Rapid sequence and Cricoid Pressure applied Laryngoscope Size: Miller and 2 Grade View: Grade II Tube type: Subglottic suction tube Number of attempts: 1 Airway Equipment and Method: Stylet Placement Confirmation: ETT inserted through vocal cords under direct vision,  CO2 detector and breath sounds checked- equal and bilateral (Itubation by B Meyers in CT. ) Secured at: 22 cm Tube secured with: Tape Dental Injury: Teeth and Oropharynx as per pre-operative assessment

## 2016-03-01 NOTE — Progress Notes (Signed)
Patient ID: [name], female   DOB: 1941-01-25, 75 y.o.   MRN: [id number] INR . Addendum to procedure report. 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 S DeveshwarMD

## 2016-03-01 NOTE — Progress Notes (Addendum)
  [facility] Progress Note  POD 2: R pop-peroneal bypass  Came to evaluate patient's right foot as she was asleep during Dr. Lianne Moris exam this am. Patient is awake, but not answering any questions. Yesterday she was sleepy but still responded to questions. She is following able to squeeze her left hand. No right upper extremity or lower extremity movement. Patient seems to have a left sided gaze preference. Will call code stroke. Discussed findings with Dr. Bridgett Larsson.   Virgina Jock, PA-C [facility] Office: [phone] Pager: [phone] 03/01/2016 8:04 AM    Addendum  I have tried to contact the patient's husband on the two listed numbers to inform him of the neurologic changes in his wife.  I could not leave a message at either number.  Will try again later.  Stroke work-up in progress.  Adele Barthel, MD [facility] Office: [phone] Pager: [phone]  03/01/2016, 8:25 AM

## 2016-03-01 NOTE — Progress Notes (Signed)
eLink Physician-Brief Progress Note Patient Name: [name] DOB: September 17, 1940 MRN: [id number]   Date of Service  03/01/2016  HPI/Events of Note  Hb 6.  eICU Interventions  Will give 1 unit PRBC.      Intervention Category Major Interventions: Other:  Porchia Sinkler 03/01/2016, 4:52 PM

## 2016-03-01 NOTE — Care Management Important Message (Signed)
Important Message  Patient Details  Name: [name] MRN: [id number] Date of Birth: 03/14/1941   Medicare Important Message Given:  Yes    Loann Quill 03/01/2016, 10:38 AM

## 2016-03-01 NOTE — Progress Notes (Signed)
CRITICAL VALUE ALERT  Critical value received:  Hgb 6  Date of notification:  03/01/16  Time of notification:  1648  Critical value read back:Yes.    Nurse who received alert:  Weston Brass  MD notified (1st page):  E-Link / Dr. Halford Chessman  Time of first page:  1649  MD notified (2nd page):  Time of second page:  Responding MD:  Dr. Halford Chessman  Time MD responded:  [phone]

## 2016-03-01 NOTE — Progress Notes (Signed)
Notified CCM Md about pt's SBP being above goal. Per CCM NP, was to use ART line. Despite giving cardene at max rate, pt's SBP stayed elevated according to ART line. Md stated was ok and no new orders received. Cuff pressure was below goal range. Will continue to monitor.

## 2016-03-01 NOTE — Progress Notes (Signed)
Decreased FIO2 50% due to abg results

## 2016-03-01 NOTE — Procedures (Signed)
S./P bilateral  Common  Carotid arteriogram followed by by endovascular revascularization of  preocclusive lt ICA stenosis  prox with  a thrombus  With stent assisted angioplasty and 4.5 mg of IA Integrelin TICI flow MCA distribution

## 2016-03-01 NOTE — Anesthesia Preprocedure Evaluation (Signed)
Anesthesia Evaluation  Patient identified by MRN, date of birth, ID band Patient unresponsive    Reviewed: Allergy & Precautions, Patient's Chart, lab work & pertinent test results, Unable to perform ROS - Chart review only  Airway Mallampati: II   Neck ROM: Limited    Dental   Pulmonary     + decreased breath sounds      Cardiovascular  Rhythm:Regular Rate:Normal     Neuro/Psych    GI/Hepatic   Endo/Other  diabetes  Renal/GU      Musculoskeletal   Abdominal   Peds  Hematology   Anesthesia Other Findings   Reproductive/Obstetrics                             Anesthesia Physical Anesthesia Plan  ASA: IV and emergent  Anesthesia Plan: General   Post-op Pain Management:    Induction: Intravenous  Airway Management Planned: Oral ETT  Additional Equipment: CVP and Arterial line  Intra-op Plan:   Post-operative Plan: Post-operative intubation/ventilation  Informed Consent: I have reviewed the patients History and Physical, chart, labs and discussed the procedure including the risks, benefits and alternatives for the proposed anesthesia with the patient or authorized representative who has indicated his/her understanding and acceptance.     Plan Discussed with: CRNA and Anesthesiologist  Anesthesia Plan Comments:         Anesthesia Quick Evaluation

## 2016-03-02 ENCOUNTER — Inpatient Hospital Stay (HOSPITAL_COMMUNITY): Payer: Medicare HMO

## 2016-03-02 DIAGNOSIS — D72829 Elevated white blood cell count, unspecified: Secondary | ICD-10-CM

## 2016-03-02 DIAGNOSIS — I639 Cerebral infarction, unspecified: Secondary | ICD-10-CM

## 2016-03-02 DIAGNOSIS — I6789 Other cerebrovascular disease: Secondary | ICD-10-CM

## 2016-03-02 DIAGNOSIS — I63413 Cerebral infarction due to embolism of bilateral middle cerebral arteries: Secondary | ICD-10-CM

## 2016-03-02 DIAGNOSIS — R739 Hyperglycemia, unspecified: Secondary | ICD-10-CM

## 2016-03-02 DIAGNOSIS — I739 Peripheral vascular disease, unspecified: Secondary | ICD-10-CM

## 2016-03-02 LAB — PROCALCITONIN: Procalcitonin: 0.41 ng/mL

## 2016-03-02 LAB — BASIC METABOLIC PANEL
ANION GAP: 6 (ref 5–15)
BUN: 22 mg/dL — ABNORMAL HIGH (ref 6–20)
CALCIUM: 8.1 mg/dL — AB (ref 8.9–10.3)
CHLORIDE: 109 mmol/L (ref 101–111)
CO2: 23 mmol/L (ref 22–32)
Creatinine, Ser: 0.96 mg/dL (ref 0.44–1.00)
GFR calc non Af Amer: 57 mL/min — ABNORMAL LOW (ref >60)
GLUCOSE: 178 mg/dL — AB (ref 65–99)
POTASSIUM: 4.6 mmol/L (ref 3.5–5.1)
Sodium: 138 mmol/L (ref 135–145)

## 2016-03-02 LAB — CBC WITH DIFFERENTIAL/PLATELET
BASOS ABS: 0 10*3/uL (ref 0.0–0.1)
BASOS PCT: 0 %
Eosinophils Absolute: 0 10*3/uL (ref 0.0–0.7)
Eosinophils Relative: 0 %
HEMATOCRIT: 22.7 % — AB (ref 36.0–46.0)
HEMOGLOBIN: 7.1 g/dL — AB (ref 12.0–15.0)
LYMPHS PCT: 10 %
Lymphs Abs: 1.6 10*3/uL (ref 0.7–4.0)
MCH: 27.1 pg (ref 26.0–34.0)
MCHC: 31.3 g/dL (ref 30.0–36.0)
MCV: 86.6 fL (ref 78.0–100.0)
MONO ABS: 1.7 10*3/uL — AB (ref 0.1–1.0)
Monocytes Relative: 11 %
NEUTROS ABS: 11.9 10*3/uL — AB (ref 1.7–7.7)
NEUTROS PCT: 79 %
Platelets: 143 10*3/uL — ABNORMAL LOW (ref 150–400)
RBC: 2.62 MIL/uL — AB (ref 3.87–5.11)
RDW: 16.2 % — ABNORMAL HIGH (ref 11.5–15.5)
WBC: 15.2 10*3/uL — ABNORMAL HIGH (ref 4.0–10.5)

## 2016-03-02 LAB — MAGNESIUM
MAGNESIUM: 2 mg/dL (ref 1.7–2.4)
Magnesium: 2.1 mg/dL (ref 1.7–2.4)

## 2016-03-02 LAB — GLUCOSE, CAPILLARY
GLUCOSE-CAPILLARY: 135 mg/dL — AB (ref 65–99)
GLUCOSE-CAPILLARY: 131 mg/dL — AB (ref 65–99)
GLUCOSE-CAPILLARY: 131 mg/dL — AB (ref 65–99)
GLUCOSE-CAPILLARY: 188 mg/dL — AB (ref 65–99)
Glucose-Capillary: 120 mg/dL — ABNORMAL HIGH (ref 65–99)
Glucose-Capillary: 132 mg/dL — ABNORMAL HIGH (ref 65–99)
Glucose-Capillary: 172 mg/dL — ABNORMAL HIGH (ref 65–99)
Glucose-Capillary: 216 mg/dL — ABNORMAL HIGH (ref 65–99)
Glucose-Capillary: 206 mg/dL — ABNORMAL HIGH (ref 65–99)

## 2016-03-02 LAB — LIPID PANEL
CHOL/HDL RATIO: 3.4 ratio
Cholesterol: 109 mg/dL (ref 0–200)
HDL: 32 mg/dL — AB (ref >40)
LDL CALC: 53 mg/dL (ref 0–99)
TRIGLYCERIDES: 121 mg/dL (ref <150)
VLDL: 24 mg/dL (ref 0–40)

## 2016-03-02 LAB — ECHOCARDIOGRAM COMPLETE
HEIGHTINCHES: 65 in
WEIGHTICAEL: 3936.53 [oz_av]

## 2016-03-02 LAB — TSH: TSH: 0.151 u[IU]/mL — ABNORMAL LOW (ref 0.350–4.500)

## 2016-03-02 LAB — LACTIC ACID, PLASMA: Lactic Acid, Venous: 1.9 mmol/L (ref 0.5–1.9)

## 2016-03-02 LAB — IRON AND TIBC
Iron: 12 ug/dL — ABNORMAL LOW (ref 28–170)
Saturation Ratios: 6 % — ABNORMAL LOW (ref 10.4–31.8)
TIBC: 210 ug/dL — ABNORMAL LOW (ref 250–450)
UIBC: 198 ug/dL

## 2016-03-02 LAB — PHOSPHORUS
PHOSPHORUS: 3.4 mg/dL (ref 2.5–4.6)
Phosphorus: 3.3 mg/dL (ref 2.5–4.6)

## 2016-03-02 MED ORDER — FENTANYL BOLUS VIA INFUSION
25.0000 ug | INTRAVENOUS | Status: DC | PRN
  Administered 2016-03-03: 100 ug via INTRAVENOUS
  Administered 2016-03-04: 50 ug via INTRAVENOUS
  Filled 2016-03-02: qty 100

## 2016-03-02 MED ORDER — FUROSEMIDE 80 MG PO TABS
80.0000 mg | ORAL_TABLET | Freq: Two times a day (BID) | ORAL | Status: DC
  Administered 2016-03-02 – 2016-03-04 (×4): 80 mg
  Filled 2016-03-02 (×4): qty 1

## 2016-03-02 MED ORDER — PREGABALIN 50 MG PO CAPS
50.0000 mg | ORAL_CAPSULE | Freq: Three times a day (TID) | ORAL | Status: DC
  Administered 2016-03-02 – 2016-03-13 (×31): 50 mg
  Filled 2016-03-02 (×29): qty 1

## 2016-03-02 MED ORDER — VITAL HIGH PROTEIN PO LIQD
1000.0000 mL | ORAL | Status: DC
  Administered 2016-03-02 – 2016-03-03 (×2): 1000 mL
  Administered 2016-03-04 (×2)
  Administered 2016-03-04: 55 mL/h
  Administered 2016-03-04: 1000 mL
  Administered 2016-03-06: 07:00:00

## 2016-03-02 MED ORDER — INSULIN ASPART 100 UNIT/ML ~~LOC~~ SOLN
0.0000 [IU] | SUBCUTANEOUS | Status: DC
  Administered 2016-03-02: 2 [IU] via SUBCUTANEOUS
  Administered 2016-03-02 (×2): 3 [IU] via SUBCUTANEOUS
  Administered 2016-03-02: 5 [IU] via SUBCUTANEOUS
  Administered 2016-03-02: 3 [IU] via SUBCUTANEOUS
  Administered 2016-03-02 – 2016-03-03 (×2): 5 [IU] via SUBCUTANEOUS
  Administered 2016-03-03: 11 [IU] via SUBCUTANEOUS
  Administered 2016-03-03: 8 [IU] via SUBCUTANEOUS
  Administered 2016-03-03: 11 [IU] via SUBCUTANEOUS
  Administered 2016-03-03 – 2016-03-04 (×3): 5 [IU] via SUBCUTANEOUS
  Administered 2016-03-04: 8 [IU] via SUBCUTANEOUS
  Administered 2016-03-04: 5 [IU] via SUBCUTANEOUS
  Administered 2016-03-04 – 2016-03-05 (×4): 8 [IU] via SUBCUTANEOUS
  Administered 2016-03-05: 2 [IU] via SUBCUTANEOUS
  Administered 2016-03-05: 8 [IU] via SUBCUTANEOUS
  Administered 2016-03-05: 5 [IU] via SUBCUTANEOUS
  Administered 2016-03-05: 2 [IU] via SUBCUTANEOUS
  Administered 2016-03-05 – 2016-03-06 (×3): 3 [IU] via SUBCUTANEOUS
  Administered 2016-03-06: 5 [IU] via SUBCUTANEOUS
  Administered 2016-03-06: 3 [IU] via SUBCUTANEOUS
  Administered 2016-03-06: 5 [IU] via SUBCUTANEOUS
  Administered 2016-03-07 (×3): 3 [IU] via SUBCUTANEOUS
  Administered 2016-03-07: 5 [IU] via SUBCUTANEOUS
  Administered 2016-03-07 (×2): 3 [IU] via SUBCUTANEOUS

## 2016-03-02 MED ORDER — SENNOSIDES 8.8 MG/5ML PO SYRP
5.0000 mL | ORAL_SOLUTION | Freq: Two times a day (BID) | ORAL | Status: DC
  Administered 2016-03-02 – 2016-03-13 (×9): 5 mL
  Filled 2016-03-02 (×11): qty 5

## 2016-03-02 MED ORDER — LORATADINE 10 MG PO TABS
10.0000 mg | ORAL_TABLET | Freq: Every day | ORAL | Status: DC
  Administered 2016-03-02 – 2016-03-04 (×3): 10 mg
  Filled 2016-03-02 (×2): qty 1

## 2016-03-02 MED ORDER — CLOPIDOGREL BISULFATE 75 MG PO TABS
75.0000 mg | ORAL_TABLET | Freq: Every day | ORAL | Status: DC
Start: 2016-03-03 — End: 2016-03-13
  Administered 2016-03-03 – 2016-03-13 (×11): 75 mg
  Filled 2016-03-02 (×12): qty 1

## 2016-03-02 MED ORDER — ATORVASTATIN CALCIUM 20 MG PO TABS
20.0000 mg | ORAL_TABLET | Freq: Every day | ORAL | Status: DC
  Administered 2016-03-02 – 2016-03-13 (×12): 20 mg
  Filled 2016-03-02 (×11): qty 1

## 2016-03-02 NOTE — Progress Notes (Signed)
Patient ID: [name], female   DOB: [birth year], 75 y.o.   MRN: [id number]    Referring Physician(s): Chen,B  Supervising Physician: [name]  Patient Status:  Inpatient  Chief Complaint:  stroke   Subjective: Intubated,sedated   Allergies: Iodine; Penicillins; Shellfish allergy; and Sulfa antibiotics  Medications: Prior to Admission medications   Medication Sig Start Date End Date Taking? Authorizing Provider  acetaminophen (TYLENOL) 500 MG tablet Take 500 mg by mouth every 6 (six) hours as needed for mild pain.   Yes Historical Provider, MD  albuterol (PROVENTIL HFA;VENTOLIN HFA) 108 (90 Base) MCG/ACT inhaler Inhale 2 puffs into the lungs every 6 (six) hours as needed.   Yes Historical Provider, MD  aspirin EC 81 MG tablet Take 81 mg by mouth daily.    Yes Historical Provider, MD  atorvastatin (LIPITOR) 20 MG tablet Take 20 mg by mouth daily.  01/26/16  Yes Historical Provider, MD  carvedilol (COREG) 25 MG tablet Take 25 mg by mouth 2 (two) times daily with a meal.  02/14/16  Yes Historical Provider, MD  cetirizine (ZYRTEC) 10 MG tablet Take 10 mg by mouth daily as needed for allergies.   Yes Historical Provider, MD  docusate sodium (COLACE) 100 MG capsule Take 100 mg by mouth 2 (two) times daily.   Yes Historical Provider, MD  fluticasone (FLONASE) 50 MCG/ACT nasal spray Place 1 spray into both nostrils daily.   Yes Historical Provider, MD  furosemide (LASIX) 40 MG tablet Take 80 mg by mouth 2 (two) times daily.  01/08/16  Yes Historical Provider, MD  insulin aspart (NOVOLOG) 100 UNIT/ML injection Inject 0-15 Units into the skin 3 (three) times daily with meals. Before each meal 3 times a day, 140-199 - 3 units, 200-250 - 5 units, 251-299 - 8 units,  300-349 - 12 units,  350 or above 15 units. Patient taking differently: Inject 4 Units into the skin 2 (two) times daily.  02/09/14  Yes Thurnell Lose, MD  insulin detemir (LEVEMIR) 100 UNIT/ML injection Inject 25 Units  into the skin 2 (two) times daily.  02/09/14  Yes Thurnell Lose, MD  losartan (COZAAR) 100 MG tablet Take 1 tablet (100 mg total) by mouth daily. 02/13/14  Yes Samuella Cota, MD  omeprazole (PRILOSEC) 20 MG capsule Take 20 mg by mouth daily.   Yes Historical Provider, MD  polyethylene glycol (MIRALAX / GLYCOLAX) packet Take 17 g by mouth 2 (two) times daily. 02/09/14  Yes Thurnell Lose, MD  potassium chloride SA (K-DUR,KLOR-CON) 20 MEQ tablet Take 20 mEq by mouth daily.   Yes Historical Provider, MD  pregabalin (LYRICA) 50 MG capsule Take 50 mg by mouth 3 (three) times daily.   Yes Historical Provider, MD  B-D INS SYRINGE 0.5CC/31GX5/16 31G X 5/16" 0.5 ML MISC  02/05/16   Historical Provider, MD     Vital Signs: BP (!) 117/41 (BP Location: Left Arm)   Pulse 61   Temp 97.4 F (36.3 C) (Axillary)   Resp 18   Ht 5\' 5"  (1.651 m)   Wt 246 lb 0.5 oz (111.6 kg)   SpO2 100%   BMI 40.94 kg/m   Physical Exam intubated,sedated; reportedly following some commands;PERRL; left CFA puncture site clean and dry, no hematoma  Imaging: Ct Angio Head W Or Wo Contrast  Result Date: 03/01/2016 CLINICAL DATA:  Right-sided weakness and aphasia. Femoral popliteal bypass performed yesterday. EXAM: CT ANGIOGRAPHY HEAD AND NECK.  CT perfusion. TECHNIQUE: Multidetector CT imaging of  the head and neck was performed using the standard protocol during bolus administration of intravenous contrast. Multiplanar CT image reconstructions and MIPs were obtained to evaluate the vascular anatomy. Carotid stenosis measurements (when applicable) are obtained utilizing NASCET criteria, using the distal internal carotid diameter as the denominator. CONTRAST:  100 cc Isovue 370 COMPARISON:  CT earlier same day. FINDINGS: CTA NECK Aortic arch: Mild atherosclerosis.  No aneurysm or dissection. Right carotid system: Common carotid artery is patent to the bifurcation. Calcified plaque along the medial margin of the distal CCA but no  stenosis. Carotid bifurcation widely patent without stenosis of the ICA. Cervical ICA widely patent. Left carotid system: Left common carotid artery widely patent to the bifurcation. Soft plaque at the carotid bifurcation with near occlusion at the left ICA origin. Persistent antegrade flow within the left ICA which is markedly narrowed because of reduced inflow. No ECA stenosis. Vertebral arteries:Both vertebral artery origins widely patent. Both vertebral arteries are widely patent through the cervical region. Skeleton: Ordinary spondylosis Other neck: Intubated. No significant soft tissue lesion in the neck. Lung apices are clear. CTA HEAD Anterior circulation: Both internal carotid arteries are patent through the skullbase. There is extensive atherosclerotic calcification in both carotid siphon regions. Stenosis is estimated at 50% in both carotid siphon regions. Supra clinoid internal carotid arteries are also narrowed 50%. On the right, the anterior and middle cerebral arteries are widely patent. Diminutive A1 segment on the left with both anterior cerebral arteries receiving most of there supply from the right carotid circulation. On the left, there is no M1 stenosis or missing M2 branch identified. Patent posterior communicating artery on the left. Posterior circulation: Both vertebral arteries are patent through the foramen magnum to the basilar. Posterior inferior cerebellar arteries are patent. No basilar stenosis. Superior cerebellar and posterior cerebral arteries are patent bilaterally. Right PCA stenosis 1 cm beyond its origin, 50-70%. Venous sinuses: Patent and normal Anatomic variants: Diminutive A1 segment on the left as noted above. 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 CT perfusion: Cerebral blood flow is normal to the right hemisphere in the visualized posterior fossa. There is diminished cerebral blood flow throughout  the left MCA territory. Mean transit time is delayed throughout the left MCA territory. Cerebral blood volume remains normal. Time to peak is delayed throughout the left MCA territory. At risk/penumbra brain throughout the left MCA territory. No sign of completed infarction. 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 Reduced caliber of the left cervical internal carotid artery because of reduced inflow. 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 Stenoses estimated at 50% bilaterally. Probable serial stenoses. No M1 stenosis and no missing M2 branches on the left. At risk brain in the left MCA territory showing normal cerebral blood volume but decreased cerebral blood flow, mean transit time and time to peak. No evidence of completed infarction. These results were called by telephone at the time of interpretation on 03/01/2016 at 10:27 am to Dr. Elson Clan , who verbally acknowledged these results. Electronically Signed   By: Nelson Chimes M.D.   On: 03/01/2016 10:54  Ct Head Wo Contrast  Result Date: 03/01/2016 CLINICAL DATA:  Post intervention. Code stroke. Right-sided weakness. EXAM: CT HEAD WITHOUT CONTRAST TECHNIQUE: Contiguous axial images were obtained from the base of the skull through the vertex without intravenous contrast. COMPARISON:  Multiple examinations earlier same day FINDINGS: No change  since the previous study. No evidence of acute infarction, mass lesion, hemorrhage, hydrocephalus or extra-axial collection. Chronic small-vessel ischemic changes of the hemispheric white matter. Stents of calcification in the carotid siphon regions. Some mucosal inflammatory changes in the paranasal sinuses. 1 cm meningioma along the floor of the posterior fossa on the right without significant mass effect upon the brain. IMPRESSION: No change or acute finding following left carotid artery stent. Electronically Signed   By: Nelson Chimes  M.D.   On: 03/01/2016 15:39  Ct Angio Neck W Or Wo Contrast  Result Date: 03/01/2016 CLINICAL DATA:  Right-sided weakness and aphasia. Femoral popliteal bypass performed yesterday. EXAM: CT ANGIOGRAPHY HEAD AND NECK.  CT perfusion. TECHNIQUE: Multidetector CT imaging of the head and neck was performed using the standard protocol during bolus administration of intravenous contrast. Multiplanar CT image reconstructions and MIPs were obtained to evaluate the vascular anatomy. Carotid stenosis measurements (when applicable) are obtained utilizing NASCET criteria, using the distal internal carotid diameter as the denominator. CONTRAST:  100 cc Isovue 370 COMPARISON:  CT earlier same day. FINDINGS: CTA NECK Aortic arch: Mild atherosclerosis.  No aneurysm or dissection. Right carotid system: Common carotid artery is patent to the bifurcation. Calcified plaque along the medial margin of the distal CCA but no stenosis. Carotid bifurcation widely patent without stenosis of the ICA. Cervical ICA widely patent. Left carotid system: Left common carotid artery widely patent to the bifurcation. Soft plaque at the carotid bifurcation with near occlusion at the left ICA origin. Persistent antegrade flow within the left ICA which is markedly narrowed because of reduced inflow. No ECA stenosis. Vertebral arteries:Both vertebral artery origins widely patent. Both vertebral arteries are widely patent through the cervical region. Skeleton: Ordinary spondylosis Other neck: Intubated. No significant soft tissue lesion in the neck. Lung apices are clear. CTA HEAD Anterior circulation: Both internal carotid arteries are patent through the skullbase. There is extensive atherosclerotic calcification in both carotid siphon regions. Stenosis is estimated at 50% in both carotid siphon regions. Supra clinoid internal carotid arteries are also narrowed 50%. On the right, the anterior and middle cerebral arteries are widely patent. Diminutive A1  segment on the left with both anterior cerebral arteries receiving most of there supply from the right carotid circulation. On the left, there is no M1 stenosis or missing M2 branch identified. Patent posterior communicating artery on the left. Posterior circulation: Both vertebral arteries are patent through the foramen magnum to the basilar. Posterior inferior cerebellar arteries are patent. No basilar stenosis. Superior cerebellar and posterior cerebral arteries are patent bilaterally. Right PCA stenosis 1 cm beyond its origin, 50-70%. Venous sinuses: Patent and normal Anatomic variants: Diminutive A1 segment on the left as noted above. 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 CT perfusion: Cerebral blood flow is normal to the right hemisphere in the visualized posterior fossa. There is diminished cerebral blood flow throughout the left MCA territory. Mean transit time is delayed throughout the left MCA territory. Cerebral blood volume remains normal. Time to peak is delayed throughout the left MCA territory. At risk/penumbra brain throughout the left MCA territory. No sign of completed infarction. 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 Reduced caliber of the left cervical internal carotid artery because of reduced inflow. 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 Stenoses estimated at 50% bilaterally. Probable serial stenoses. No M1 stenosis and no missing M2 branches  on the left. At risk brain in the left MCA territory showing normal cerebral blood volume but decreased cerebral blood flow, mean transit time and time to peak. No evidence of completed infarction. These results were called by telephone at the time of interpretation on 03/01/2016 at 10:27 am to Dr. Elson Clan , who verbally acknowledged these results. Electronically Signed   By: Nelson Chimes M.D.    On: 03/01/2016 10:54  [name] Brain Wo Contrast  Result Date: 03/02/2016 CLINICAL DATA:  Initial evaluation for acute altered mental status, right-sided weakness, found to have near occlusion of the left ICA, status post stent assisted angioplasty with revascularization. EXAM: MRI HEAD WITHOUT CONTRAST MRA HEAD WITHOUT CONTRAST TECHNIQUE: Multiplanar, multiecho pulse sequences of the brain and surrounding structures were obtained without intravenous contrast. Angiographic images of the head were obtained using MRA technique without contrast. COMPARISON:  Prior CT and CTA from 03/01/2016. FINDINGS: MRI HEAD FINDINGS Diffuse prominence of the CSF containing spaces is compatible with generalized age-related cerebral atrophy. Patchy T2/FLAIR hyperintensity within the periventricular and deep white matter both cerebral hemispheres most consistent with chronic small vessel ischemic disease, mild for age. There is patchy multifocal restricted diffusion involving the left lentiform nucleus and left internal capsule, compatible with acute ischemic infarct (series 4, image 23, 27). Superior extension into the periventricular white matter of the left corona radiata/centrum semi ovale. The no associated hemorrhage. Few additional punctate foci of restricted diffusion within the cortical gray matter of the left occipital lobe (series 4, image 28, 25). Additional small 5 mm ischemic infarct within the mid right centrum semi ovale I (series 4, image 34). Punctate cortical infarct right parietal lobe, motor strip (series 4, image 40). Additional punctate cortical infarct right occipital lobe (series 4, image 28). Patchy restricted diffusion within the right cerebellar hemisphere (series 4, image 13). No associated hemorrhage or mass effect. Gray-white matter differentiation otherwise maintained. Major intracranial vascular flow voids are preserved. No midline shift or mass effect. No hydrocephalus. Within the right cerebellar  hemisphere noted, consistent with large calcifications seen within this region on prior CT. This may reflect a densely calcified meningioma. No associated mass effect. No other mass lesion. Apparent scattered serpiginous FLAIR signal intensity seen within multiple cortical sulci noted, suspected to be related to recent angiogram and/or supplemental oxygen therapy. No signal abnormality seen on gradient echo sequence to suggest subarachnoid hemorrhage. In addition, no hemorrhage seen on previous CTs. Craniocervical junction normal. Visualized upper cervical spine unremarkable. Pituitary gland normal. No acute abnormality about the globes and orbits. Patient is status post lens extraction bilaterally. Scattered mucosal thickening throughout the paranasal sinuses. Fluid within nasopharynx. Patient likely intubated. Small bilateral mastoid effusions noted. Inner ear structures normal. Bone marrow signal intensity within normal limits. No scalp soft tissue abnormality. MRA HEAD FINDINGS ANTERIOR CIRCULATION: Distal cervical segments of the internal carotid arteries are patent with antegrade flow. Left ICA slightly smaller as compared to the right. Petrous segments patent bilaterally. Scattered multifocal atheromatous irregularity within the cavernous/ supraclinoid ICAs, left worse than right. There is a superimposed focal moderate to severe stenosis left supraclinoid ICA (series 6, image 52). No flow-limiting stenosis on the right. Left A1 segment largely hypoplastic/absent, which likely accounts for the diminutive left ICA. Moderate stenosis at the origin of the right A1 segment. Anterior communicating artery normal. Anterior cerebral arteries demonstrate multifocal irregularity with mild to moderate stenoses. M1 segments demonstrate multi focal atheromatous irregularity without high-grade flow-limiting stenosis or occlusion. Distal MCA branches well perfused  and symmetric. Distal small vessel atheromatous irregularity  within the MCA branches bilaterally. POSTERIOR CIRCULATION: Vertebral arteries patent to the vertebrobasilar junction. Posterior inferior cerebral arteries patent proximally. Basilar artery patent to its distal aspect. Superior cerebellar arteries demonstrate atheromatous irregularity but are patent bilaterally. Moderate multifocal stenoses proximal left P1 and P2 segments. Left PCA demonstrates multifocal atheromatous irregularity but is patent to its distal aspect. Small posterior communicating artery noted on the left. Severe stenosis proximal right PCA with attenuated flow distally. No aneurysm or vascular malformation. IMPRESSION: MRI HEAD IMPRESSION: 1. Acute patchy ischemic infarcts involving left basal ganglia and internal capsule, with extension into periventricular white matter of the left corona radiata/centrum semi ovale. No associated hemorrhage or mass effect. 2. Additional scattered small punctate cortical and white matter infarcts involving the bilateral cerebral hemispheres and right cerebellum as above. No associated hemorrhage or mass effect. 3. 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 No associated edema. MRA HEAD IMPRESSION: 1. No large vessel occlusion within the intracranial circulation. 2. Short-segment severe stenosis at the supraclinoid left ICA. 3. Severe stenosis proximal right PCA with attenuated flow distally. 4. Additional multi focal atheromatous irregularity involving the anterior posterior circulations as above without severe or correctable stenosis. 5. Hypoplastic/absent left A1 segment, likely accounting for the diminutive left ICA is compared to the right. Electronically Signed   By: Jeannine Boga M.D.   On: 03/02/2016 06:59  Ct Cerebral Perfusion W Contrast  Result Date: 03/01/2016 CLINICAL DATA:  Right-sided weakness and aphasia. Femoral popliteal bypass performed yesterday. EXAM: CT ANGIOGRAPHY HEAD AND  NECK.  CT perfusion. TECHNIQUE: Multidetector CT imaging of the head and neck was performed using the standard protocol during bolus administration of intravenous contrast. Multiplanar CT image reconstructions and MIPs were obtained to evaluate the vascular anatomy. Carotid stenosis measurements (when applicable) are obtained utilizing NASCET criteria, using the distal internal carotid diameter as the denominator. CONTRAST:  100 cc Isovue 370 COMPARISON:  CT earlier same day. FINDINGS: CTA NECK Aortic arch: Mild atherosclerosis.  No aneurysm or dissection. Right carotid system: Common carotid artery is patent to the bifurcation. Calcified plaque along the medial margin of the distal CCA but no stenosis. Carotid bifurcation widely patent without stenosis of the ICA. Cervical ICA widely patent. Left carotid system: Left common carotid artery widely patent to the bifurcation. Soft plaque at the carotid bifurcation with near occlusion at the left ICA origin. Persistent antegrade flow within the left ICA which is markedly narrowed because of reduced inflow. No ECA stenosis. Vertebral arteries:Both vertebral artery origins widely patent. Both vertebral arteries are widely patent through the cervical region. Skeleton: Ordinary spondylosis Other neck: Intubated. No significant soft tissue lesion in the neck. Lung apices are clear. CTA HEAD Anterior circulation: Both internal carotid arteries are patent through the skullbase. There is extensive atherosclerotic calcification in both carotid siphon regions. Stenosis is estimated at 50% in both carotid siphon regions. Supra clinoid internal carotid arteries are also narrowed 50%. On the right, the anterior and middle cerebral arteries are widely patent. Diminutive A1 segment on the left with both anterior cerebral arteries receiving most of there supply from the right carotid circulation. On the left, there is no M1 stenosis or missing M2 branch identified. Patent posterior  communicating artery on the left. Posterior circulation: Both vertebral arteries are patent through the foramen magnum to the basilar. Posterior inferior cerebellar arteries are patent. No basilar stenosis. Superior cerebellar and posterior cerebral arteries are  patent bilaterally. Right PCA stenosis 1 cm beyond its origin, 50-70%. Venous sinuses: Patent and normal Anatomic variants: Diminutive A1 segment on the left as noted above. 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 CT perfusion: Cerebral blood flow is normal to the right hemisphere in the visualized posterior fossa. There is diminished cerebral blood flow throughout the left MCA territory. Mean transit time is delayed throughout the left MCA territory. Cerebral blood volume remains normal. Time to peak is delayed throughout the left MCA territory. At risk/penumbra brain throughout the left MCA territory. No sign of completed infarction. 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 Reduced caliber of the left cervical internal carotid artery because of reduced inflow. 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 Stenoses estimated at 50% bilaterally. Probable serial stenoses. No M1 stenosis and no missing M2 branches on the left. At risk brain in the left MCA territory showing normal cerebral blood volume but decreased cerebral blood flow, mean transit time and time to peak. No evidence of completed infarction. These results were called by telephone at the time of interpretation on 03/01/2016 at 10:27 am to Dr. Elson Clan , who verbally acknowledged these results. Electronically Signed   By: Nelson Chimes M.D.   On: 03/01/2016 10:54  Dg Ang/ext/uni/or Right  Result Date: 02/29/2016 CLINICAL DATA:  75 year old female with a history of critical limb ischemia and right nonhealing foot wound. EXAM: RIGHT ANG/EXT/UNI/  OR CONTRAST:  Op note FLUOROSCOPY TIME:  One image provided COMPARISON:  Angiogram 12/28/2015 FINDINGS: Single intraoperative spot image of right lower leg angiogram demonstrates surgical instruments overlying the popliteal fossa region. Opacification of the popliteal artery, with discontinuous filling of the tibial vessels. Surgical changes of the calf. Patent short-segment graft between popliteal artery and peroneal artery, with peroneal artery filling beyond the field-of-view. IMPRESSION: Limited images during intraoperative angiogram demonstrates patent short segment venous graft between popliteal artery and peroneal artery, with peroneal artery filling beyond the field of view. Signed, Dulcy Fanny. Earleen Newport, DO [facility] Electronically Signed   By: Corrie Mckusick D.O.   On: 02/29/2016 10:22  Dg Chest Port 1 View  Result Date: 03/01/2016 CLINICAL DATA:  Acute respiratory failure. New right-sided central line placement. EXAM: PORTABLE CHEST 1 VIEW COMPARISON:  Radiographs. FINDINGS: Cardiomegaly and pulmonary vascular congestion identified. Endotracheal tube with tip 3.6 cm above the carina, right IJ central venous catheter with tip overlying the superior cavoatrial junction, and NG tube entering the stomach with tip off the field of view noted. Left basilar atelectasis identified. There is no evidence pneumothorax. IMPRESSION: Support apparatus as described. Right IJ central venous catheter with tip overlying the superior cavoatrial junction. Cardiomegaly with pulmonary vascular congestion and left basilar atelectasis. Electronically Signed   By: Margarette Canada M.D.   On: 03/01/2016 15:50  Dg Abd Portable 1v  Result Date: 03/01/2016 CLINICAL DATA:  OG tube placement. EXAM: PORTABLE ABDOMEN - 1 VIEW COMPARISON:  None. FINDINGS: An OG tube is noted with tip overlying the peripyloric region. The bowel gas pattern is unremarkable. Left lower lung airspace  disease versus atelectasis noted. IMPRESSION: OG tube with tip overlying the peripyloric region. Left lower lung atelectasis versus airspace disease. Electronically Signed   By: Margarette Canada M.D.   On: 03/01/2016 16:20  [name] Head/brain [id number] Cm  Result Date: 03/02/2016 CLINICAL DATA:  Initial evaluation for acute  altered mental status, right-sided weakness, found to have near occlusion of the left ICA, status post stent assisted angioplasty with revascularization. EXAM: MRI HEAD WITHOUT CONTRAST MRA HEAD WITHOUT CONTRAST TECHNIQUE: Multiplanar, multiecho pulse sequences of the brain and surrounding structures were obtained without intravenous contrast. Angiographic images of the head were obtained using MRA technique without contrast. COMPARISON:  Prior CT and CTA from 03/01/2016. FINDINGS: MRI HEAD FINDINGS Diffuse prominence of the CSF containing spaces is compatible with generalized age-related cerebral atrophy. Patchy T2/FLAIR hyperintensity within the periventricular and deep white matter both cerebral hemispheres most consistent with chronic small vessel ischemic disease, mild for age. There is patchy multifocal restricted diffusion involving the left lentiform nucleus and left internal capsule, compatible with acute ischemic infarct (series 4, image 23, 27). Superior extension into the periventricular white matter of the left corona radiata/centrum semi ovale. The no associated hemorrhage. Few additional punctate foci of restricted diffusion within the cortical gray matter of the left occipital lobe (series 4, image 28, 25). Additional small 5 mm ischemic infarct within the mid right centrum semi ovale I (series 4, image 34). Punctate cortical infarct right parietal lobe, motor strip (series 4, image 40). Additional punctate cortical infarct right occipital lobe (series 4, image 28). Patchy restricted diffusion within the right cerebellar hemisphere (series 4, image 13). No associated hemorrhage or mass  effect. Gray-white matter differentiation otherwise maintained. Major intracranial vascular flow voids are preserved. No midline shift or mass effect. No hydrocephalus. Within the right cerebellar hemisphere noted, consistent with large calcifications seen within this region on prior CT. This may reflect a densely calcified meningioma. No associated mass effect. No other mass lesion. Apparent scattered serpiginous FLAIR signal intensity seen within multiple cortical sulci noted, suspected to be related to recent angiogram and/or supplemental oxygen therapy. No signal abnormality seen on gradient echo sequence to suggest subarachnoid hemorrhage. In addition, no hemorrhage seen on previous CTs. Craniocervical junction normal. Visualized upper cervical spine unremarkable. Pituitary gland normal. No acute abnormality about the globes and orbits. Patient is status post lens extraction bilaterally. Scattered mucosal thickening throughout the paranasal sinuses. Fluid within nasopharynx. Patient likely intubated. Small bilateral mastoid effusions noted. Inner ear structures normal. Bone marrow signal intensity within normal limits. No scalp soft tissue abnormality. MRA HEAD FINDINGS ANTERIOR CIRCULATION: Distal cervical segments of the internal carotid arteries are patent with antegrade flow. Left ICA slightly smaller as compared to the right. Petrous segments patent bilaterally. Scattered multifocal atheromatous irregularity within the cavernous/ supraclinoid ICAs, left worse than right. There is a superimposed focal moderate to severe stenosis left supraclinoid ICA (series 6, image 52). No flow-limiting stenosis on the right. Left A1 segment largely hypoplastic/absent, which likely accounts for the diminutive left ICA. Moderate stenosis at the origin of the right A1 segment. Anterior communicating artery normal. Anterior cerebral arteries demonstrate multifocal irregularity with mild to moderate stenoses. M1 segments  demonstrate multi focal atheromatous irregularity without high-grade flow-limiting stenosis or occlusion. Distal MCA branches well perfused and symmetric. Distal small vessel atheromatous irregularity within the MCA branches bilaterally. POSTERIOR CIRCULATION: Vertebral arteries patent to the vertebrobasilar junction. Posterior inferior cerebral arteries patent proximally. Basilar artery patent to its distal aspect. Superior cerebellar arteries demonstrate atheromatous irregularity but are patent bilaterally. Moderate multifocal stenoses proximal left P1 and P2 segments. Left PCA demonstrates multifocal atheromatous irregularity but is patent to its distal aspect. Small posterior communicating artery noted on the left. Severe stenosis proximal right PCA with attenuated flow distally. No aneurysm or vascular malformation. IMPRESSION:  MRI HEAD IMPRESSION: 1. Acute patchy ischemic infarcts involving left basal ganglia and internal capsule, with extension into periventricular white matter of the left corona radiata/centrum semi ovale. No associated hemorrhage or mass effect. 2. Additional scattered small punctate cortical and white matter infarcts involving the bilateral cerebral hemispheres and right cerebellum as above. No associated hemorrhage or mass effect. 3. 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 No associated edema. MRA HEAD IMPRESSION: 1. No large vessel occlusion within the intracranial circulation. 2. Short-segment severe stenosis at the supraclinoid left ICA. 3. Severe stenosis proximal right PCA with attenuated flow distally. 4. Additional multi focal atheromatous irregularity involving the anterior posterior circulations as above without severe or correctable stenosis. 5. Hypoplastic/absent left A1 segment, likely accounting for the diminutive left ICA is compared to the right. Electronically Signed   By: Jeannine Boga M.D.   On:  03/02/2016 06:59  Ct Head Code Stroke Wo Contrast  Result Date: 03/01/2016 CLINICAL DATA:  Right-sided hemiplegia and aphasia EXAM: CT HEAD WITHOUT CONTRAST TECHNIQUE: Contiguous axial images were obtained from the base of the skull through the vertex without intravenous contrast. COMPARISON:  February 12, 2014 FINDINGS: There is age related volume loss. There is no appreciable intracranial mass, hemorrhage, extra-axial fluid collection, or midline shift. There is stable mild small vessel disease in the centra semiovale bilaterally. No acute infarct is evident. No hyperdense vessel is evident on this study. The middle cerebral artery attenuation appear symmetric bilaterally. There are scattered foci of calcification in the distal vertebral arteries. There is also calcification in the cavernous carotid artery an carotid siphon regions. The bony calvarium appears intact. The mastoid air cells are clear. Visualized paranasal sinuses are clear. Orbits appear symmetric bilaterally. IMPRESSION: Age related volume loss with mild periventricular small vessel disease. No acute infarct evident currently. No edema or hemorrhage. No hyperdense vessel evident. Basal ganglia calcification is physiologic. Critical Value/emergent results were called by telephone at the time of interpretation on 03/01/2016 at 8:49 am to Dr. Elson Clan , who verbally acknowledged these results. Electronically Signed   By: Lowella Grip III M.D.   On: 03/01/2016 08:49   Labs:  CBC:  Recent Labs  02/29/16 0042 03/01/16 0455 03/01/16 1635 03/01/16 2200 03/02/16 0600  WBC 15.3* 11.7* 10.8*  --  15.2*  HGB 8.9* 7.3* 6.0* 7.4* 7.1*  HCT 28.1* 22.8* 18.8* 23.4* 22.7*  PLT 157 170 131*  --  143*    COAGS:  Recent Labs  02/26/16 1104  INR 1.20  APTT 31    BMP:  Recent Labs  02/26/16 1104  02/28/16 1547 02/29/16 0041 03/01/16 0455 03/02/16 0600  NA 140  < > 141 135 135 138  K 4.4  < > 4.1 5.2* 4.6 4.6  CL 106  --    --  104 103 109  CO2 27  --   --  24 26 23   GLUCOSE 160*  < > 116* 218* 330* 178*  BUN 20  --   --  16 24* 22*  CALCIUM 9.7  --   --  8.2* 8.5* 8.1*  CREATININE 0.84  --   --  0.76 1.18* 0.96  GFRNONAA >60  --   --  >60 45* 57*  GFRAA >60  --   --  >60 52* >60  < > = values in this interval not displayed.  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 Acute patchy ischemic infarcts involving left basal ganglia and internal capsule, with extension into periventricular white matter of the left corona radiata/centrum semi ovale. No associated hemorrhage or mass effect. 2. Additional scattered small punctate cortical and white matter infarcts involving the bilateral cerebral hemispheres and right cerebellum as above. No associated hemorrhage or mass effect. 3. 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 No associated edema. MRA HEAD IMPRESSION: 1. No large vessel occlusion within the intracranial circulation. 2. Short-segment severe stenosis at the supraclinoid left ICA. 3. Severe stenosis proximal right PCA with attenuated flow distally. 4. Additional multi focal atheromatous irregularity involving the anterior posterior circulations as above without severe or correctable stenosis. 5. Hypoplastic/absent left A1 segment, likely accounting for the diminutive left ICA is compared to the right Plans as per neurology/CCM/VVS. Electronically Signed: D.  Rowe Robert 03/02/2016, 2:53 PM   I spent a total of 15 minutes at the the patient's bedside AND on the patient's hospital floor or unit, greater than 50% of which was counseling/coordinating care for cerebral arteriogram with endovascular intervention

## 2016-03-02 NOTE — Progress Notes (Signed)
Initial Nutrition Assessment  DOCUMENTATION CODES:   Morbid obesity  INTERVENTION:   Recommend Vital High Protein @ 55 ml/hr 30 ml Prostat BID Provides: 1320 ml, 1520 kcal, 145 grams protein, and 1103 ml H2O.    NUTRITION DIAGNOSIS:   Increased nutrient needs related to wound healing as evidenced by estimated needs.  GOAL:   Provide needs based on ASPEN/SCCM guidelines  MONITOR:   Skin, I & O's, Vent status  REASON FOR ASSESSMENT:   Ventilator    ASSESSMENT:   PMH of DM, diabetic neuropathy, GERD, HLD, HTN, aortic stenosis who was admitted on 7/26 for right popliteal bypass graft in the setting of chronic right lower extremity wound. 7/28 code stroke called, pt s/p L ICA revascularization.    Patient is currently intubated on ventilator support Temp (24hrs), Avg:97.6 F (36.4 C), Min:97.3 F (36.3 C), Max:97.9 F (36.6 C)  Propofol: 6.6 ml/hr provides: 174 kcal per day from lipid Medications reviewed and include: lasix, senokot Labs reviewed CBG's: 131-216 Nutrition-Focused physical exam completed. Findings are no fat depletion, no muscle depletion, and severe edema.   Spoke with husband. He reports good appetite with no recent weight changes. He wants to take her to weight watchers after she is better.  No food allergy to iodine.  Spoke with RN.   Diet Order:  Diet NPO time specified  Skin:  Wound (see comment) (R DM foot ulcer, R leg incision)  Last BM:  7/25  Height:   Ht Readings from Last 1 Encounters:  03/01/16 5\' 5"  (1.651 m)    Weight:   Wt Readings from Last 1 Encounters:  03/02/16 246 lb 0.5 oz (111.6 kg)    Ideal Body Weight:  56.8 kg  BMI:  Body mass index is 40.94 kg/m.  Estimated Nutritional Needs:   Kcal:  [id number]  Protein:  142 grams   Fluid:  > 1.8 L/day  EDUCATION NEEDS:   No education needs identified at this time  [place], [place], [place] Pager [phone] After Hours Pager

## 2016-03-02 NOTE — Progress Notes (Addendum)
PULMONARY / CRITICAL CARE MEDICINE   Name: [name] MRN: [id number] DOB: [birth year]    ADMISSION DATE:  02/28/2016 CONSULTATION DATE:  03/01/16  REFERRING MD:  Dr. Leonie Man / Stroke MD   CHIEF COMPLAINT:  CVA, Acute Respiratory Failure   HISTORY OF PRESENT ILLNESS:  75 y/o M with PMH of DM, diabetic neuropathy, GERD, HLD, HTN, aortic stenosis who was admitted on 7/26 for right popliteal bypass graft in the setting of chronic right lower extremity wound.  Angiography in 12/2015 revealed bilateral extensive tibial disease.  The patient was initially recommended amputation however, she refused.  On 7/26 she underwent a right below the knee popliteal artery to peroneal artery bypass. Postoperatively she was treated with heparin and transitioned to Plavix on 7/27. JP drain postoperatively had high output. She was evaluated by physical therapy and recommended for skilled nursing facility placement. Postoperatively there were concern for weak doppler flow. On 7/28, she was noted to have worsening of peroneal signal consistent with early failure and anemia requiring transfusion of 2 units PRBCs. Palliative amputation was recommended.  On re-rounding, the patient was noted to have altered mental status with right-sided weakness and left gaze. Code Stroke called for further evaluation.  Upon*team evaluation, she was noted to have incomprehensible speech and moaning as well as right hemiplegia. She was premedicated with Solu-Medrol and Benadryl for imaging given her history of iodine allergy. Imaging delayed due to difficulty obtaining IV access. She was emergently taken to neuro-interventional radiology where she underwent procedure for revascularization (stent assisted angioplasty) of her left ICA in the setting of thrombus.  The patient was intubated and returned to ICU for further observation.  PCCM consulted for ICU assistance  SUBJECTIVE:  Patient underwent intubation yesterday and stenting of ICA with  code stroke. No acute events overnight. Left femoral arterial sheath removed this morning.  REVIEW OF SYSTEMS:  Unable to assess due to intubation & sedation.  VITAL SIGNS: BP (!) 100/40   Pulse (!) 56   Temp 97.3 F (36.3 C) (Axillary)   Resp 19   Ht 5\' 5"  (1.651 m)   Wt 246 lb 0.5 oz (111.6 kg)   SpO2 100%   BMI 40.94 kg/m   HEMODYNAMICS:    VENTILATOR SETTINGS: Vent Mode: PSV;CPAP FiO2 (%):  [30 %-100 %] 30 % Set Rate:  [14 bmp-18 bmp] 18 bmp Vt Set:  [450 mL-500 mL] 450 mL PEEP:  [5 cmH20-8 cmH20] 5 cmH20 Pressure Support:  [5 cmH20] 5 cmH20 Plateau Pressure:  [20 cmH20-31 cmH20] 27 cmH20  INTAKE / OUTPUT: I/O last 3 completed shifts: In: 4330.5 [I.V.:3622.1; Blood:263.3; Other:5; NG/GT:90; IV Piggyback:350] Out: 2628 [Urine:2520; Drains:8; Blood:100]  PHYSICAL EXAMINATION: General:  Obese female. Family at bedside. No distress. Neuro:  Sedated on ventilator. Spontaneously moving extremities. Opens eyes and seems to track to voice. Not consistently following commands. HEENT:  Endotracheal tube in place. No scleral icterus or injection. Cardiovascular:  Regular rate. Sinus rhythm on telemetry. Bilateral lower extremity edema. Pulmonary: Coarse breath sounds bilaterally. Symmetric chest wall rise on ventilator. Normal respiratory effort on pressure support 5/5 FiO2 0.3. Abdomen:  Protuberant. Hypoactive bowel sounds. Soft. Skin:  Warm and dry. No rash on exposed skin.  LABS:  BMET  Recent Labs Lab 02/29/16 0041 03/01/16 0455 03/02/16 0600  NA 135 135 138  K 5.2* 4.6 4.6  CL 104 103 109  CO2 24 26 23   BUN 16 24* 22*  CREATININE 0.76 1.18* 0.96  GLUCOSE 218* 330* 178*  Electrolytes  Recent Labs Lab 02/29/16 0041 03/01/16 0455 03/01/16 1635 03/02/16 0600  CALCIUM 8.2* 8.5*  --  8.1*  MG  --   --  1.9 2.0  PHOS  --   --  2.2* 3.3    CBC  Recent Labs Lab 03/01/16 0455 03/01/16 1635 03/01/16 2200 03/02/16 0600  WBC 11.7* 10.8*  --  15.2*   HGB 7.3* 6.0* 7.4* 7.1*  HCT 22.8* 18.8* 23.4* 22.7*  PLT 170 131*  --  143*    Coag's  Recent Labs Lab 02/26/16 1104  APTT 31  INR 1.20    Sepsis Markers  Recent Labs Lab 03/01/16 2030 03/01/16 2042 03/01/16 2350 03/02/16 0600  LATICACIDVEN  --  1.9 1.9  --   PROCALCITON 0.32  --   --  0.41    ABG  Recent Labs Lab 03/01/16 1623  PHART 7.379  PCO2ART 42.6  PO2ART 268.0*    Liver Enzymes  Recent Labs Lab 02/26/16 1104  AST 28  ALT 38  ALKPHOS 61  BILITOT 0.5  ALBUMIN 3.5    Cardiac Enzymes No results for input(s): TROPONINI, PROBNP in the last 168 hours.  Glucose  Recent Labs Lab 03/01/16 2241 03/01/16 2348 03/02/16 0050 03/02/16 0203 03/02/16 0303 03/02/16 0725  GLUCAP 135* 120* 132* 131* 131* 172*    Imaging Ct Angio Head W Or Wo Contrast  Result Date: 03/01/2016 CLINICAL DATA:  Right-sided weakness and aphasia. Femoral popliteal bypass performed yesterday. EXAM: CT ANGIOGRAPHY HEAD AND NECK.  CT perfusion. TECHNIQUE: Multidetector CT imaging of the head and neck was performed using the standard protocol during bolus administration of intravenous contrast. Multiplanar CT image reconstructions and MIPs were obtained to evaluate the vascular anatomy. Carotid stenosis measurements (when applicable) are obtained utilizing NASCET criteria, using the distal internal carotid diameter as the denominator. CONTRAST:  100 cc Isovue 370 COMPARISON:  CT earlier same day. FINDINGS: CTA NECK Aortic arch: Mild atherosclerosis.  No aneurysm or dissection. Right carotid system: Common carotid artery is patent to the bifurcation. Calcified plaque along the medial margin of the distal CCA but no stenosis. Carotid bifurcation widely patent without stenosis of the ICA. Cervical ICA widely patent. Left carotid system: Left common carotid artery widely patent to the bifurcation. Soft plaque at the carotid bifurcation with near occlusion at the left ICA origin. Persistent  antegrade flow within the left ICA which is markedly narrowed because of reduced inflow. No ECA stenosis. Vertebral arteries:Both vertebral artery origins widely patent. Both vertebral arteries are widely patent through the cervical region. Skeleton: Ordinary spondylosis Other neck: Intubated. No significant soft tissue lesion in the neck. Lung apices are clear. CTA HEAD Anterior circulation: Both internal carotid arteries are patent through the skullbase. There is extensive atherosclerotic calcification in both carotid siphon regions. Stenosis is estimated at 50% in both carotid siphon regions. Supra clinoid internal carotid arteries are also narrowed 50%. On the right, the anterior and middle cerebral arteries are widely patent. Diminutive A1 segment on the left with both anterior cerebral arteries receiving most of there supply from the right carotid circulation. On the left, there is no M1 stenosis or missing M2 branch identified. Patent posterior communicating artery on the left. Posterior circulation: Both vertebral arteries are patent through the foramen magnum to the basilar. Posterior inferior cerebellar arteries are patent. No basilar stenosis. Superior cerebellar and posterior cerebral arteries are patent bilaterally. Right PCA stenosis 1 cm beyond its origin, 50-70%. Venous sinuses: Patent and normal Anatomic variants: Diminutive  A1 segment on the left as noted above. 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 CT perfusion: Cerebral blood flow is normal to the right hemisphere in the visualized posterior fossa. There is diminished cerebral blood flow throughout the left MCA territory. Mean transit time is delayed throughout the left MCA territory. Cerebral blood volume remains normal. Time to peak is delayed throughout the left MCA territory. At risk/penumbra brain throughout the left MCA territory. No sign of completed infarction.  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 Reduced caliber of the left cervical internal carotid artery because of reduced inflow. 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 Stenoses estimated at 50% bilaterally. Probable serial stenoses. No M1 stenosis and no missing M2 branches on the left. At risk brain in the left MCA territory showing normal cerebral blood volume but decreased cerebral blood flow, mean transit time and time to peak. No evidence of completed infarction. These results were called by telephone at the time of interpretation on 03/01/2016 at 10:27 am to Dr. Rose Fillers , who verbally acknowledged these results. Electronically Signed   By: Paulina Fusi M.D.   On: 03/01/2016 10:54  Ct Head Wo Contrast  Result Date: 03/01/2016 CLINICAL DATA:  Post intervention. Code stroke. Right-sided weakness. EXAM: CT HEAD WITHOUT CONTRAST TECHNIQUE: Contiguous axial images were obtained from the base of the skull through the vertex without intravenous contrast. COMPARISON:  Multiple examinations earlier same day FINDINGS: No change since the previous study. No evidence of acute infarction, mass lesion, hemorrhage, hydrocephalus or extra-axial collection. Chronic small-vessel ischemic changes of the hemispheric white matter. Stents of calcification in the carotid siphon regions. Some mucosal inflammatory changes in the paranasal sinuses. 1 cm meningioma along the floor of the posterior fossa on the right without significant mass effect upon the brain. IMPRESSION: No change or acute finding following left carotid artery stent. Electronically Signed   By: Paulina Fusi M.D.   On: 03/01/2016 15:39  Ct Angio Neck W Or Wo Contrast  Result Date: 03/01/2016 CLINICAL DATA:  Right-sided weakness and aphasia. Femoral popliteal bypass performed yesterday. EXAM: CT ANGIOGRAPHY HEAD AND NECK.  CT perfusion. TECHNIQUE: Multidetector CT imaging of the  head and neck was performed using the standard protocol during bolus administration of intravenous contrast. Multiplanar CT image reconstructions and MIPs were obtained to evaluate the vascular anatomy. Carotid stenosis measurements (when applicable) are obtained utilizing NASCET criteria, using the distal internal carotid diameter as the denominator. CONTRAST:  100 cc Isovue 370 COMPARISON:  CT earlier same day. FINDINGS: CTA NECK Aortic arch: Mild atherosclerosis.  No aneurysm or dissection. Right carotid system: Common carotid artery is patent to the bifurcation. Calcified plaque along the medial margin of the distal CCA but no stenosis. Carotid bifurcation widely patent without stenosis of the ICA. Cervical ICA widely patent. Left carotid system: Left common carotid artery widely patent to the bifurcation. Soft plaque at the carotid bifurcation with near occlusion at the left ICA origin. Persistent antegrade flow within the left ICA which is markedly narrowed because of reduced inflow. No ECA stenosis. Vertebral arteries:Both vertebral artery origins widely patent. Both vertebral arteries are widely patent through the cervical region. Skeleton: Ordinary spondylosis Other neck: Intubated. No significant soft tissue lesion in the neck. Lung apices are clear. CTA HEAD Anterior circulation: Both internal carotid arteries are patent through the skullbase. There is extensive atherosclerotic calcification in both  carotid siphon regions. Stenosis is estimated at 50% in both carotid siphon regions. Supra clinoid internal carotid arteries are also narrowed 50%. On the right, the anterior and middle cerebral arteries are widely patent. Diminutive A1 segment on the left with both anterior cerebral arteries receiving most of there supply from the right carotid circulation. On the left, there is no M1 stenosis or missing M2 branch identified. Patent posterior communicating artery on the left. Posterior circulation: Both  vertebral arteries are patent through the foramen magnum to the basilar. Posterior inferior cerebellar arteries are patent. No basilar stenosis. Superior cerebellar and posterior cerebral arteries are patent bilaterally. Right PCA stenosis 1 cm beyond its origin, 50-70%. Venous sinuses: Patent and normal Anatomic variants: Diminutive A1 segment on the left as noted above. 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 CT perfusion: Cerebral blood flow is normal to the right hemisphere in the visualized posterior fossa. There is diminished cerebral blood flow throughout the left MCA territory. Mean transit time is delayed throughout the left MCA territory. Cerebral blood volume remains normal. Time to peak is delayed throughout the left MCA territory. At risk/penumbra brain throughout the left MCA territory. No sign of completed infarction. 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 Reduced caliber of the left cervical internal carotid artery because of reduced inflow. 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 Stenoses estimated at 50% bilaterally. Probable serial stenoses. No M1 stenosis and no missing M2 branches on the left. At risk brain in the left MCA territory showing normal cerebral blood volume but decreased cerebral blood flow, mean transit time and time to peak. No evidence of completed infarction. These results were called by telephone at the time of interpretation on 03/01/2016 at 10:27 am to Dr. Elson Clan , who verbally acknowledged these results. Electronically Signed   By: Nelson Chimes M.D.   On: 03/01/2016 10:54  Mr Brain Wo Contrast  Result Date: 03/02/2016 CLINICAL DATA:  Initial evaluation for acute altered mental status, right-sided weakness, found to have near occlusion of the left ICA, status post stent assisted angioplasty with revascularization.  EXAM: MRI HEAD WITHOUT CONTRAST MRA HEAD WITHOUT CONTRAST TECHNIQUE: Multiplanar, multiecho pulse sequences of the brain and surrounding structures were obtained without intravenous contrast. Angiographic images of the head were obtained using MRA technique without contrast. COMPARISON:  Prior CT and CTA from 03/01/2016. FINDINGS: MRI HEAD FINDINGS Diffuse prominence of the CSF containing spaces is compatible with generalized age-related cerebral atrophy. Patchy T2/FLAIR hyperintensity within the periventricular and deep white matter both cerebral hemispheres most consistent with chronic small vessel ischemic disease, mild for age. There is patchy multifocal restricted diffusion involving the left lentiform nucleus and left internal capsule, compatible with acute ischemic infarct (series 4, image 23, 27). Superior extension into the periventricular white matter of the left corona radiata/centrum semi ovale. The no associated hemorrhage. Few additional punctate foci of restricted diffusion within the cortical gray matter of the left occipital lobe (series 4, image 28, 25). Additional small 5 mm ischemic infarct within the mid right centrum semi ovale I (series 4, image 34). Punctate cortical infarct right parietal lobe, motor strip (series 4, image 40). Additional punctate cortical infarct right occipital lobe (series 4, image 28). Patchy restricted diffusion within the right cerebellar hemisphere (series 4, image 13). No associated hemorrhage or mass effect. Gray-white matter differentiation otherwise maintained. Major intracranial vascular flow voids are  preserved. No midline shift or mass effect. No hydrocephalus. Within the right cerebellar hemisphere noted, consistent with large calcifications seen within this region on prior CT. This may reflect a densely calcified meningioma. No associated mass effect. No other mass lesion. Apparent scattered serpiginous FLAIR signal intensity seen within multiple cortical  sulci noted, suspected to be related to recent angiogram and/or supplemental oxygen therapy. No signal abnormality seen on gradient echo sequence to suggest subarachnoid hemorrhage. In addition, no hemorrhage seen on previous CTs. Craniocervical junction normal. Visualized upper cervical spine unremarkable. Pituitary gland normal. No acute abnormality about the globes and orbits. Patient is status post lens extraction bilaterally. Scattered mucosal thickening throughout the paranasal sinuses. Fluid within nasopharynx. Patient likely intubated. Small bilateral mastoid effusions noted. Inner ear structures normal. Bone marrow signal intensity within normal limits. No scalp soft tissue abnormality. MRA HEAD FINDINGS ANTERIOR CIRCULATION: Distal cervical segments of the internal carotid arteries are patent with antegrade flow. Left ICA slightly smaller as compared to the right. Petrous segments patent bilaterally. Scattered multifocal atheromatous irregularity within the cavernous/ supraclinoid ICAs, left worse than right. There is a superimposed focal moderate to severe stenosis left supraclinoid ICA (series 6, image 52). No flow-limiting stenosis on the right. Left A1 segment largely hypoplastic/absent, which likely accounts for the diminutive left ICA. Moderate stenosis at the origin of the right A1 segment. Anterior communicating artery normal. Anterior cerebral arteries demonstrate multifocal irregularity with mild to moderate stenoses. M1 segments demonstrate multi focal atheromatous irregularity without high-grade flow-limiting stenosis or occlusion. Distal MCA branches well perfused and symmetric. Distal small vessel atheromatous irregularity within the MCA branches bilaterally. POSTERIOR CIRCULATION: Vertebral arteries patent to the vertebrobasilar junction. Posterior inferior cerebral arteries patent proximally. Basilar artery patent to its distal aspect. Superior cerebellar arteries demonstrate atheromatous  irregularity but are patent bilaterally. Moderate multifocal stenoses proximal left P1 and P2 segments. Left PCA demonstrates multifocal atheromatous irregularity but is patent to its distal aspect. Small posterior communicating artery noted on the left. Severe stenosis proximal right PCA with attenuated flow distally. No aneurysm or vascular malformation. IMPRESSION: MRI HEAD IMPRESSION: 1. Acute patchy ischemic infarcts involving left basal ganglia and internal capsule, with extension into periventricular white matter of the left corona radiata/centrum semi ovale. No associated hemorrhage or mass effect. 2. Additional scattered small punctate cortical and white matter infarcts involving the bilateral cerebral hemispheres and right cerebellum as above. No associated hemorrhage or mass effect. 3. 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 No associated edema. MRA HEAD IMPRESSION: 1. No large vessel occlusion within the intracranial circulation. 2. Short-segment severe stenosis at the supraclinoid left ICA. 3. Severe stenosis proximal right PCA with attenuated flow distally. 4. Additional multi focal atheromatous irregularity involving the anterior posterior circulations as above without severe or correctable stenosis. 5. Hypoplastic/absent left A1 segment, likely accounting for the diminutive left ICA is compared to the right. Electronically Signed   By: Jeannine Boga M.D.   On: 03/02/2016 06:59  Ct Cerebral Perfusion W Contrast  Result Date: 03/01/2016 CLINICAL DATA:  Right-sided weakness and aphasia. Femoral popliteal bypass performed yesterday. EXAM: CT ANGIOGRAPHY HEAD AND NECK.  CT perfusion. TECHNIQUE: Multidetector CT imaging of the head and neck was performed using the standard protocol during bolus administration of intravenous contrast. Multiplanar CT image reconstructions and MIPs were obtained to evaluate the vascular anatomy. Carotid  stenosis measurements (when applicable) are obtained utilizing NASCET criteria, using the distal internal carotid diameter as the  denominator. CONTRAST:  100 cc Isovue 370 COMPARISON:  CT earlier same day. FINDINGS: CTA NECK Aortic arch: Mild atherosclerosis.  No aneurysm or dissection. Right carotid system: Common carotid artery is patent to the bifurcation. Calcified plaque along the medial margin of the distal CCA but no stenosis. Carotid bifurcation widely patent without stenosis of the ICA. Cervical ICA widely patent. Left carotid system: Left common carotid artery widely patent to the bifurcation. Soft plaque at the carotid bifurcation with near occlusion at the left ICA origin. Persistent antegrade flow within the left ICA which is markedly narrowed because of reduced inflow. No ECA stenosis. Vertebral arteries:Both vertebral artery origins widely patent. Both vertebral arteries are widely patent through the cervical region. Skeleton: Ordinary spondylosis Other neck: Intubated. No significant soft tissue lesion in the neck. Lung apices are clear. CTA HEAD Anterior circulation: Both internal carotid arteries are patent through the skullbase. There is extensive atherosclerotic calcification in both carotid siphon regions. Stenosis is estimated at 50% in both carotid siphon regions. Supra clinoid internal carotid arteries are also narrowed 50%. On the right, the anterior and middle cerebral arteries are widely patent. Diminutive A1 segment on the left with both anterior cerebral arteries receiving most of there supply from the right carotid circulation. On the left, there is no M1 stenosis or missing M2 branch identified. Patent posterior communicating artery on the left. Posterior circulation: Both vertebral arteries are patent through the foramen magnum to the basilar. Posterior inferior cerebellar arteries are patent. No basilar stenosis. Superior cerebellar and posterior cerebral arteries are patent  bilaterally. Right PCA stenosis 1 cm beyond its origin, 50-70%. Venous sinuses: Patent and normal Anatomic variants: Diminutive A1 segment on the left as noted above. 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 CT perfusion: Cerebral blood flow is normal to the right hemisphere in the visualized posterior fossa. There is diminished cerebral blood flow throughout the left MCA territory. Mean transit time is delayed throughout the left MCA territory. Cerebral blood volume remains normal. Time to peak is delayed throughout the left MCA territory. At risk/penumbra brain throughout the left MCA territory. No sign of completed infarction. 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 Reduced caliber of the left cervical internal carotid artery because of reduced inflow. 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 Stenoses estimated at 50% bilaterally. Probable serial stenoses. No M1 stenosis and no missing M2 branches on the left. At risk brain in the left MCA territory showing normal cerebral blood volume but decreased cerebral blood flow, mean transit time and time to peak. No evidence of completed infarction. These results were called by telephone at the time of interpretation on 03/01/2016 at 10:27 am to Dr. Elson Clan , who verbally acknowledged these results. Electronically Signed   By: Nelson Chimes M.D.   On: 03/01/2016 10:54  Dg Chest Port 1 View  Result Date: 03/01/2016 CLINICAL DATA:  Acute respiratory failure. New right-sided central line placement. EXAM: PORTABLE CHEST 1 VIEW COMPARISON:  Radiographs. FINDINGS: Cardiomegaly and pulmonary vascular congestion identified. Endotracheal tube with tip 3.6 cm above the carina, right IJ central venous catheter with tip overlying the superior cavoatrial junction, and NG tube entering the stomach with tip off the field of  view noted. Left basilar atelectasis identified. There is no evidence pneumothorax. IMPRESSION: Support apparatus as described. Right IJ central venous catheter with tip overlying the superior cavoatrial junction. Cardiomegaly  with pulmonary vascular congestion and left basilar atelectasis. Electronically Signed   By: Margarette Canada M.D.   On: 03/01/2016 15:50  Dg Abd Portable 1v  Result Date: 03/01/2016 CLINICAL DATA:  OG tube placement. EXAM: PORTABLE ABDOMEN - 1 VIEW COMPARISON:  None. FINDINGS: An OG tube is noted with tip overlying the peripyloric region. The bowel gas pattern is unremarkable. Left lower lung airspace disease versus atelectasis noted. IMPRESSION: OG tube with tip overlying the peripyloric region. Left lower lung atelectasis versus airspace disease. Electronically Signed   By: Margarette Canada M.D.   On: 03/01/2016 16:20  Mr Jodene Nam Head/brain [id number] Cm  Result Date: 03/02/2016 CLINICAL DATA:  Initial evaluation for acute altered mental status, right-sided weakness, found to have near occlusion of the left ICA, status post stent assisted angioplasty with revascularization. EXAM: MRI HEAD WITHOUT CONTRAST MRA HEAD WITHOUT CONTRAST TECHNIQUE: Multiplanar, multiecho pulse sequences of the brain and surrounding structures were obtained without intravenous contrast. Angiographic images of the head were obtained using MRA technique without contrast. COMPARISON:  Prior CT and CTA from 03/01/2016. FINDINGS: MRI HEAD FINDINGS Diffuse prominence of the CSF containing spaces is compatible with generalized age-related cerebral atrophy. Patchy T2/FLAIR hyperintensity within the periventricular and deep white matter both cerebral hemispheres most consistent with chronic small vessel ischemic disease, mild for age. There is patchy multifocal restricted diffusion involving the left lentiform nucleus and left internal capsule, compatible with acute ischemic infarct (series 4, image 23, 27). Superior extension into the  periventricular white matter of the left corona radiata/centrum semi ovale. The no associated hemorrhage. Few additional punctate foci of restricted diffusion within the cortical gray matter of the left occipital lobe (series 4, image 28, 25). Additional small 5 mm ischemic infarct within the mid right centrum semi ovale I (series 4, image 34). Punctate cortical infarct right parietal lobe, motor strip (series 4, image 40). Additional punctate cortical infarct right occipital lobe (series 4, image 28). Patchy restricted diffusion within the right cerebellar hemisphere (series 4, image 13). No associated hemorrhage or mass effect. Gray-white matter differentiation otherwise maintained. Major intracranial vascular flow voids are preserved. No midline shift or mass effect. No hydrocephalus. Within the right cerebellar hemisphere noted, consistent with large calcifications seen within this region on prior CT. This may reflect a densely calcified meningioma. No associated mass effect. No other mass lesion. Apparent scattered serpiginous FLAIR signal intensity seen within multiple cortical sulci noted, suspected to be related to recent angiogram and/or supplemental oxygen therapy. No signal abnormality seen on gradient echo sequence to suggest subarachnoid hemorrhage. In addition, no hemorrhage seen on previous CTs. Craniocervical junction normal. Visualized upper cervical spine unremarkable. Pituitary gland normal. No acute abnormality about the globes and orbits. Patient is status post lens extraction bilaterally. Scattered mucosal thickening throughout the paranasal sinuses. Fluid within nasopharynx. Patient likely intubated. Small bilateral mastoid effusions noted. Inner ear structures normal. Bone marrow signal intensity within normal limits. No scalp soft tissue abnormality. MRA HEAD FINDINGS ANTERIOR CIRCULATION: Distal cervical segments of the internal carotid arteries are patent with antegrade flow. Left ICA  slightly smaller as compared to the right. Petrous segments patent bilaterally. Scattered multifocal atheromatous irregularity within the cavernous/ supraclinoid ICAs, left worse than right. There is a superimposed focal moderate to severe stenosis left supraclinoid ICA (series 6, image 52). No flow-limiting stenosis on the right. Left A1 segment largely hypoplastic/absent, which likely accounts for the diminutive left ICA. Moderate stenosis at the origin of the right A1 segment. Anterior communicating artery  normal. Anterior cerebral arteries demonstrate multifocal irregularity with mild to moderate stenoses. M1 segments demonstrate multi focal atheromatous irregularity without high-grade flow-limiting stenosis or occlusion. Distal MCA branches well perfused and symmetric. Distal small vessel atheromatous irregularity within the MCA branches bilaterally. POSTERIOR CIRCULATION: Vertebral arteries patent to the vertebrobasilar junction. Posterior inferior cerebral arteries patent proximally. Basilar artery patent to its distal aspect. Superior cerebellar arteries demonstrate atheromatous irregularity but are patent bilaterally. Moderate multifocal stenoses proximal left P1 and P2 segments. Left PCA demonstrates multifocal atheromatous irregularity but is patent to its distal aspect. Small posterior communicating artery noted on the left. Severe stenosis proximal right PCA with attenuated flow distally. No aneurysm or vascular malformation. IMPRESSION: MRI HEAD IMPRESSION: 1. Acute patchy ischemic infarcts involving left basal ganglia and internal capsule, with extension into periventricular white matter of the left corona radiata/centrum semi ovale. No associated hemorrhage or mass effect. 2. Additional scattered small punctate cortical and white matter infarcts involving the bilateral cerebral hemispheres and right cerebellum as above. No associated hemorrhage or mass effect. 3. 13 mm densely calcified lesion at the  inferior right cerebellar hemisphere, likely a small meningioma. No associated edema. MRA HEAD IMPRESSION: 1. No large vessel occlusion within the intracranial circulation. 2. Short-segment severe stenosis at the supraclinoid left ICA. 3. Severe stenosis proximal right PCA with attenuated flow distally. 4. Additional multi focal atheromatous irregularity involving the anterior posterior circulations as above without severe or correctable stenosis. 5. Hypoplastic/absent left A1 segment, likely accounting for the diminutive left ICA is compared to the right. Electronically Signed   By: Jeannine Boga M.D.   On: 03/02/2016 06:59    STUDIES:  CTA Head / Neck 7/28: At risk brain in the L MCA territory showing normal cerebral blood volume but decreased cerebral flow CT Cerebral Perfusion 7/28: near occlusion of the L ICA at the origin Abd X-ray 7/28: Enteric catheter with tip overlying the peripyloric region. Port CXR 7/28: Endotracheal tube in good position. Enteric feeding tube coursing below diaphragm. Right internal jugular central venous catheter in place. Low lung volumes. Bilateral basilar opacities. MRI/MRA of Brain 7/29: Acute patchy ischemic infarcts left basal ganglia & internal capsule with extension into periventricular white matter of left corona radiata/centrum semi-ovale. No hemorrhage or mass effect. Scattered small punctate cortical & white matter infarcts involving bilateral cerebral hemispheres & rites or bowel movement. No associated mass or hemorrhage. 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 No large vessel occlusion within intracranial circulation. Short segment severe stenosis left ICA. Severe stenosis proximal right PCA. Multifocal atheromatous irregularity involving anterior & posterior circulations without severe or correctable stenosis.  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  P:  Full Vent Suppor Weaning FiO2 for Sat >92% Albuterol Neb prn Intermittent CXR & ABG Daily SBT / WUA once OK with neuro   CARDIOVASCULAR A:  H/O Hypertension  H/O Hyperlipidemia RLE Popliteal Artery to Peroneal Artery Bypass - Completed 7/26 H/O Peripheral Vascular Disease  P:  Continuous Telemetry Monitoring Vitals per unit protocol SBP goal 120-130 per Neuro IR Cardene gtt prn to maintain SBP goal - weaning TTE pending Continue Lipitor 20mg  daily, Coreg 25mg  bid, Lasix 80mg  bid,   RENAL A:   No acute issues.  P:   Monitoring UOP with Foley Trending electrolytes & renal function daily Replacing electrolytes as indicated  GASTROINTESTINAL A:   Obesity   P:   NPO Tube Feedings per Dietary Recommendations Pepcid IV q12hr Senna VT bid  HEMATOLOGIC A:   Anemia - S/P 2 units PRBC's 7/27 & 1u PRBC 7/28. Hgb stable.  P:  Trending cell counts daily w/ CBC SCD on LLE for DVT  Consider heparin SQ for DVT when ok per Neuro  ASA + Plavix per Neurology  Transfuse for Hgb <7.0  INFECTIOUS A:   No evidence of acute infection.  P:   Holding on antibiotics Trending Procalcitonin per algorithm Awaiting Tracheal Aspirate Culture  ENDOCRINE A:   H/O DM - Hyperglycemia. Off insulin gtt.   P:    Accu-Checks q4hr  SSI per Moderate Dose Algorithm  FAMILY  - Updates: Husband & daughter updated at bedside 7/29 by Dr. Ashok Cordia.  - Inter-disciplinary family meet or Palliative Care meeting due by:  8/4  TODAY'S SUMMARY:  75 y/o F with a PMH of DM, HTN & PVD admitted 7/26 for planned right popliteal artery to peroneal artery bypass.  Developed AMS, right sided weakness on 7/28 and found to have occlusion of L ICA s/p neuro revascularization per IR.  Continuing Cardene wean. Continuing intubated status while patient needs to remain fully recumbent for comfort. Plan for spontaneous breathing trial and potential extubation in the morning depending upon neurology and interventional radiology's plan of care.  I have spent a total of 31 minutes of critical care time today caring for the patient and reviewing the patient's electronic medical record.  Sonia Baller Ashok Cordia, M.D. [facility] Pager:  [phone] After 3pm or if no response, call [phone] 03/02/2016, 8:58 AM

## 2016-03-02 NOTE — Progress Notes (Signed)
OT Cancellation Note  Patient Details Name: [name] MRN: [id number] DOB: 1941-04-22   Cancelled Treatment:    Reason Eval/Treat Not Completed: Patient not medically ready (bedrest). S/p code stroke with bedrest orders. Will follow up for OT re-eval with updated activity orders or d/c of bedrest.   Binnie Kand M.S., OTR/L Pager: [phone]  03/02/2016, 10:46 AM

## 2016-03-02 NOTE — Progress Notes (Signed)
63f sheath removed exoseal used dressing applied no complications.  Josh RN reviewed groin site no complications.

## 2016-03-02 NOTE — Progress Notes (Signed)
PT Cancellation Note  Patient Details Name: [name] MRN: [id number] DOB: 1941/04/24   Cancelled Treatment:    Reason Eval/Treat Not Completed: Patient not medically ready (active bedrest orders s/p code stroke)   Duncan Dull 03/02/2016, 7:21 AM Alben Deeds, PT DPT  [phone]

## 2016-03-02 NOTE — Progress Notes (Signed)
  Echocardiogram 2D Echocardiogram has been performed.  [name] 03/02/2016, 2:32 PM

## 2016-03-02 NOTE — Progress Notes (Signed)
[facility]  Subjective  - sedated on vent, apparently did move some  Objective (!) 100/40 (!) 56 97.3 F (36.3 C) (Axillary) 19 100%  Intake/Output Summary (Last 24 hours) at 03/02/16 0835 Last data filed at 03/02/16 0700  Gross per 24 hour  Intake          4325.47 ml  Output             1528 ml  Net          2797.47 ml   Right leg incisions clean Right foot wound pale but some granulation  Assessment/Planning: S/p peroneal bypass Peri op CVA appreciate Neuro and IR involvement Vent per critical care Continue wound care on foot.  Not currently a candidate for any leg intervention if bypass occludes  Ruta Hinds 03/02/2016 8:35 AM --  Laboratory Lab Results:  Recent Labs  03/01/16 1635 03/01/16 2200 03/02/16 0600  WBC 10.8*  --  15.2*  HGB 6.0* 7.4* 7.1*  HCT 18.8* 23.4* 22.7*  PLT 131*  --  143*   BMET  Recent Labs  03/01/16 0455 03/02/16 0600  NA 135 138  K 4.6 4.6  CL 103 109  CO2 26 23  GLUCOSE 330* 178*  BUN 24* 22*  CREATININE 1.18* 0.96  CALCIUM 8.5* 8.1*    COAG Lab Results  Component Value Date   INR 1.20 02/26/2016   INR 1.11 02/12/2014   INR 1.08 02/04/2014   No results found for: PTT

## 2016-03-02 NOTE — Progress Notes (Addendum)
STROKE TEAM PROGRESS NOTE   HISTORY OF PRESENT ILLNESS (per record)  [name] is a 75 y.o. female with history of hypertension, hyperlipidemia, diabetes with diabetic neuropathy, aortic stenosis, and a nonhealing ischemic right foot ulcer status post right lower extremity bypass graft surgery performed 02/28/2016 presenting with aphasia, right hemiplegia, and left-sided gaze preference. She did not receive IV t-PA due to recent surgery.   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  RN at bedside.  Overall RN feels patient's condition is gradually improving. She is intubated and sedated, but follows commands.  ROS cannot be done at this time.  I reviewed the MRI with family and RN at the bedside.  Patient remains on Cardene.  Sheath pulled this morning   OBJECTIVE Temp:  [97.3 F (36.3 C)-98.9 F (37.2 C)] 97.3 F (36.3 C) (07/29 0759) Pulse Rate:  [47-95] 56 (07/29 0700) Cardiac Rhythm: Sinus bradycardia (07/29 0000) Resp:  [4-27] 19 (07/29 0700) BP: (93-153)/(34-90) 100/40 (07/29 0700) SpO2:  [94 %-100 %] 100 % (07/29 0735) Arterial Line BP: (126-180)/(37-54) 164/45 (07/29 0700) FiO2 (%):  [30 %-100 %] 30 % (07/29 0735) Weight:  [111.6 kg (246 lb 0.5 oz)-112 kg (246 lb 14.6 oz)] 111.6 kg (246 lb 0.5 oz) (07/29 0330)  CBC:  Recent Labs Lab 03/01/16 1635 03/01/16 2200 03/02/16 0600  WBC 10.8*  --  15.2*  NEUTROABS  --   --  11.9*  HGB 6.0*  7.4* 7.1*  HCT 18.8* 23.4* 22.7*  MCV 85.1  --  86.6  PLT 131*  --  143*    Basic Metabolic Panel:  Recent Labs Lab 03/01/16 0455 03/01/16 1635 03/02/16 0600  NA 135  --  138  K 4.6  --  4.6  CL 103  --  109  CO2 26  --  23  GLUCOSE 330*  --  178*  BUN 24*  --  22*  CREATININE 1.18*  --  0.96  CALCIUM 8.5*  --  8.1*  MG  --  1.9 2.0  PHOS  --  2.2* 3.3    Lipid Panel:    Component Value Date/Time   CHOL 109 03/02/2016 0600   TRIG 121 03/02/2016 0600   HDL 32 (L) 03/02/2016 0600   CHOLHDL 3.4 03/02/2016 0600   VLDL 24 03/02/2016 0600   LDLCALC 53 03/02/2016 0600   HgbA1c:  Lab Results  Component Value Date   HGBA1C 8.1 (H) 02/29/2016   Urine Drug Screen:    Component Value Date/Time   LABOPIA NONE DETECTED 02/12/2014 0110   COCAINSCRNUR NONE DETECTED 02/12/2014 0110   LABBENZ NONE DETECTED 02/12/2014 0110   AMPHETMU NONE DETECTED 02/12/2014 0110   THCU NONE DETECTED 02/12/2014 0110   LABBARB NONE DETECTED 02/12/2014 0110      IMAGING  Ct Angio Head and Neck W Or Wo Contrast 03/01/2016 Near occlusion of the left ICA at the origin. Reduced caliber of the left cervical internal carotid artery  because of reduced inflow. 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 Stenoses estimated at 50% bilaterally. Probable serial stenoses. No M1 stenosis and no missing M2 branches on the left. At risk brain in the left MCA territory showing normal cerebral blood volume but decreased cerebral blood flow, mean transit time and time to peak. No evidence of completed infarction.    Ct Head Wo Contrast 03/01/2016 No change or acute finding following left carotid artery stent.   Mr Jodene Nam Head/brain Wo Cm 03/02/2016  MRI HEAD  1. Acute patchy ischemic infarcts involving left basal ganglia and internal capsule, with extension into periventricular white matter of the left corona radiata/centrum semi ovale. No associated hemorrhage  or mass effect.  2. Additional scattered small punctate cortical and white matter infarcts involving the bilateral cerebral hemispheres and right cerebellum as above. No associated hemorrhage or mass effect.  3. 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 No associated edema.   MRA HEAD 1. No large vessel occlusion within the intracranial circulation.  2. Short-segment severe stenosis at the supraclinoid left ICA.  3. Severe stenosis proximal right PCA with attenuated flow distally.  4. Additional multi focal atheromatous irregularity involving the anterior posterior circulations as above without severe or correctable stenosis.  5. Hypoplastic/absent left A1 segment, likely accounting for the diminutive left ICA is compared to the right.    Ct Cerebral Perfusion W Contrast 03/01/2016 Near occlusion of the left ICA at the origin. Reduced caliber of the left cervical internal carotid artery because of reduced inflow. 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 Stenoses estimated at 50% bilaterally. Probable serial stenoses. No M1 stenosis and no missing M2 branches on the left. At risk brain in the left MCA territory showing normal cerebral blood volume but decreased cerebral blood flow, mean transit time and time to peak. No evidence of completed infarction.    Ct Head Code Stroke Wo Contrast 03/01/2016 Age related volume loss with mild periventricular small vessel disease. No acute infarct evident currently. No edema or hemorrhage. No hyperdense vessel evident. Basal ganglia calcification is physiologic.    Dg Chest Port 1 View 03/01/2016 Support apparatus as described. Right IJ central venous catheter with tip overlying the superior cavoatrial junction. Cardiomegaly with pulmonary vascular congestion and left basilar atelectasis.   Dg Abd Portable 1v 03/01/2016 OG tube with tip  overlying the peripyloric region. Left lower lung atelectasis versus airspace disease.   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) [I63.232]  Procedures:  CEREBRAL ANGIOGRAM [id number] (Custom)]       S./P bilateral  Common  Carotid arteriogram followed by by endovascular revascularization of  preocclusive lt ICA stenosis  prox with  a thrombus  With stent assisted angioplasty and 4.5 mg of IA Integrelin TICI flow MCA distribution       PHYSICAL EXAM General - Obese, in NAD; sedated on propofol   HEENT:  NCAT, PERRL, sclera clear; ET tube in place Cardiovascular - Regular rate and rhythm Pulmonary: CTA Abdomen: NT, ND, normal bowel sounds Extremities: Bandage on right foot  Neurological Exam Mental Status: Sedated on propofol.  Follows simple commands Orientation:  Not tested due to intubation and sedation Speech:  Not tested  Cranial Nerves:  PERRL; OCRs intact; face grossly symmetric, hearing grossly intact;  spontaneous cough noted  Motor Exam:  Tone:  Within normal limits; Patient on sedation thus exam limited.  Able to move toes more on the left  Sensory: grossly intact to light touch throughout  Coordination: Deferred  Gait: Deferred  ASSESSMENT/PLAN Ms. EBONNIE STAHELI is a 75 y.o. female with history of hypertension, hyperlipidemia, diabetes with diabetic neuropathy, aortic stenosis, and a nonhealing ischemic right foot ulcer status post right lower extremity bypass graft surgery performed 02/28/2016 presenting with aphasia, right hemiplegia, and left-sided gaze preference. She did not receive IV t-PA due to recent surgery.  Stroke:  Bilateral infarcts felt to be embolic from an uncertain source.  With Left ICA disease now s/p left ICA stent  Resultant  Decreased LOC and right sided weakness  MRI - as above  multiple patchy bilateral infarcts  MRA - Short-segment severe stenosis at the supraclinoid left ICA.  Severe stenosis proximal right PCA.  Carotid Doppler - refer to cerebral angiogram  2D Echo - pending  LDL - 53  HgbA1c 8.1  VTE prophylaxis - SCDs  Diet NPO time specified  aspirin 81 mg daily prior to admission, now on aspirin 325 mg daily and clopidogrel 75 mg daily  Patient counseled to be compliant with her antithrombotic medications  Ongoing aggressive stroke risk factor management  Therapy recommendations: Pending  Disposition: Pending  Hypertension  Blood pressure run somewhat low at times  Permissive hypertension (OK if < 220/120) but gradually normalize in 5-7 days  Long-term BP goal normotensive  Hyperlipidemia  Home meds:  Lipitor 20 mg daily resumed in hospital  LDL 53, goal < 70  Continue statin at discharge  Diabetes  HgbA1c 8.12, goal < 7.0  Uncontrolled  Other Stroke Risk Factors  Advanced age  Obesity, Body mass index is 40.94 kg/m., recommend weight loss, diet and exercise as appropriate    Other Active Problems  Anemia/thrombocytopenia  Leukocytosis  Hospital day # 3  CRITICAL CARE NEUROLOGY ATTENDING NOTE Patient was seen and examined by me personally. I independently viewed imaging studies, participated in medical decision making and plan of care. The laboratory and radiographic studies were personally reviewed by me.  ROS pertinent positives could not be fully documented due to LOC and sedation on propofol  Assessment and plan completed by me personally and fully documented above.  PLAN FOR TODAY:  Patient care to include electrolyte replacement and SSI glucose management  ECHO pending  Leukocytosis  Will follow CBC's  CXR ordered for today  Condition is improved  This patient is critically ill and at significant risk of neurological worsening, death and care requires constant monitoring of vital signs,  hemodynamics,respiratory and cardiac monitoring, extensive review of multiple databases, frequent neurological assessment, discussion with family, other specialists and medical decision making of high complexity.  This critical care time does not reflect procedure time, or teaching time or supervisory time of PA/NP/Med Resident etc. but could involve care discussion time.  I spent 30 minutes of Neurocritical Care time in the care of  this patient.  SIGNED BY: Dr. Elissa Hefty       To contact Stroke Continuity provider, please refer to [email or web address] After hours, contact General Neurology

## 2016-03-02 NOTE — Consult Note (Signed)
Initial Neurological Consultation                      NEURO HOSPITALIST CONSULT NOTE      Reason for Consult:  Acute onset of right hemiplegia   HPI:                                                                                                                                          [name] is an 75 y.o. female who is status post right lower extremity bypass surgery. This morning it was noted that she was a phasic and not moving the right side of her body. The stroke team was notified.  [name] was taken immediately to CT for further evaluation. The initial CT was unremarkable. Given the significant nature of the neurological deficit it was felt that CT angiogram would be of benefit. There was some delay due to the fact that it was extremely difficult to obtain IV access. Multiple attempts were made and multiple services were contacted.  During this period of time but this was being carefully monitored. It was noted that she seems slightly less responsive over a period of time. It was also noted that she was breathing in a much more shallow matter. It was requested that the crash cart be brought into the room. Anesthesia was consult for further evaluation of need for airway protection. Anesthesia felt that it was prudent to intubate the patient at that time.  After intubation [name] underwent CT angiogram and CT perfusion studies. 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 There was also the presence of some stenosis in the supraclinoid portion of the internal carotid artery. The perfusion studies indicated the potential for salvage of the area of ischemia in the right middle cerebral artery distribution.  Consultation was ongoing with other members of the stroke and interventional neuro radiology team. It was felt that [facility] it would likely obtain the most benefit from interventional radiology.  [name] was taken to  the interventional neuro radiology suite. She underwent extraction of a clot which was located at the origin of the right left internal carotid artery. She subsequently underwent placement of a stent at that area.  After the procedure but this and was brought to the neuro intensive care unit. The family was notified of her status intermittently during the course of these events. The vascular attending was also notified of all procedures and evaluations that were performed.      Past Medical History:  Diagnosis Date  . Aortic stenosis   . Complication of anesthesia    Hard to wake patient up after having anesthesia  . Diabetes mellitus without complication ([place])   . Diabetic neuropathy (HCC)    Takes Lyrica  . Edema of both legs    Takes Lasix  .  GERD (gastroesophageal reflux disease)   . High cholesterol   . HTN (hypertension)   . Hypertension   . Shortness of breath dyspnea   . Spasm of back muscles   . Wound, open    Right great toe    Past Surgical History:  Procedure Laterality Date  . BYPASS GRAFT POPLITEAL TO TIBIAL Right 02/28/2016   Procedure: BYPASS GRAFT RIGHT BELOW KNEE POPLITEAL TO PERONEAL USING REVERSED RIGHT GREATER SAPHENOUS VEIN;  Surgeon: Conrad Turton, MD;  Location: [facility];  Service: Vascular;  Laterality: Right;  . CYST EXCISION     Abdomen  . EYE SURGERY Bilateral    Cataract removal  . INTRAMEDULLARY (IM) NAIL INTERTROCHANTERIC Left 02/04/2014   Procedure: INTRAMEDULLARY (IM) NAIL INTERTROCHANTRIC FEMORAL;  Surgeon: Mauri Pole, MD;  Location: [facility];  Service: Orthopedics;  Laterality: Left;  . ORIF TOE FRACTURE Right 02/08/2014   Procedure: OPEN REDUCTION INTERNAL FIXATION Right METATARSAL  FRACTURE ;  Surgeon: Wylene Simmer, MD;  Location: [facility];  Service: Orthopedics;  Laterality: Right;  . PERIPHERAL VASCULAR CATHETERIZATION N/A 12/28/2015   Procedure: Abdominal Aortogram w/Lower Extremity;  Surgeon: Conrad Old Tappan, MD;  Location: [facility];   Service: Cardiovascular;  Laterality: N/A;  . VEIN HARVEST Right 02/28/2016   Procedure: RIGHT GREATER SAPHENOUS VEIN HARVEST;  Surgeon: Conrad [place] Baltimore, MD;  Location: [facility];  Service: Vascular;  Laterality: Right;    MEDICATIONS:                                                                                                                     I have reviewed the patient's current medications.  Allergies  Allergen Reactions  . Iodine Anaphylaxis  . Penicillins Anaphylaxis  . Shellfish Allergy Anaphylaxis  . Sulfa Antibiotics Anaphylaxis     Social History:  reports that she has never smoked. She has never used smokeless tobacco. She reports that she does not drink alcohol or use drugs.  Family History  Problem Relation Age of Onset  . Diabetes Other      ROS:                                                                                                                                       History obtained from chart  General ROS: negative for - chills, fatigue, fever, night sweats, weight gain or weight loss Psychological ROS: negative for -  behavioral disorder, hallucinations, memory difficulties, mood swings or suicidal ideation Ophthalmic ROS: negative for - blurry vision, double vision, eye pain or loss of vision ENT ROS: negative for - epistaxis, nasal discharge, oral lesions, sore throat, tinnitus or vertigo Allergy and Immunology ROS: negative for - hives or itchy/watery eyes Hematological and Lymphatic ROS: negative for - bleeding problems, bruising or swollen lymph nodes Endocrine ROS: negative for - galactorrhea, hair pattern changes, polydipsia/polyuria or temperature intolerance Respiratory ROS: negative for - cough, hemoptysis, shortness of breath or wheezing Cardiovascular ROS: negative for - chest pain, dyspnea on exertion, edema or irregular heartbeat Gastrointestinal ROS: negative for - abdominal pain, diarrhea, hematemesis, nausea/vomiting or stool  incontinence Genito-Urinary ROS: negative for - dysuria, hematuria, incontinence or urinary frequency/urgency Musculoskeletal ROS: negative for - joint swelling or muscular weakness Neurological ROS: as noted in HPI Dermatological ROS: negative for rash and skin lesion changes   General Exam                                                                                                      Blood pressure (!) 119/42, pulse 61, temperature 97.4 F (36.3 C), temperature source Axillary, resp. rate 20, height 5\' 5"  (1.651 m), weight 111.6 kg (246 lb 0.5 oz), SpO2 100 %. HEENT-  Normocephalic, no lesions, without obvious abnormality.  Normal external eye and conjunctiva.  Normal TM's bilaterally.  Normal auditory canals and external ears. Normal external nose, mucus membranes and septum.  Normal pharynx. Cardiovascular- regular rate and rhythm, S1, S2 normal, no murmur, click, rub or gallop, pulses palpable throughout   Lungs- chest clear, no wheezing, rales, normal symmetric air entry, Heart exam - S1, S2 normal, no murmur, no gallop, rate regular Abdomen- soft, non-tender; bowel sounds normal; no masses,  no organomegaly Extremities- less then 2 second capillary refill Lymph-no adenopathy palpable Musculoskeletal-no joint tenderness, deformity or swelling Skin-warm and dry, no hyperpigmentation, vitiligo, or suspicious lesions  Neurological Examination Mental Status: [name] is a phasic she is unable to answer questions she has gaze preference to the left with spontaneous movements of the left upper and lower extremities. 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 with spontaneous antigravity movements on the left Sensory: Appears intact on the left Cerebellar: Could not be assessed       Lab Results: Basic  Metabolic Panel:  Recent Labs Lab 02/26/16 1104 02/28/16 1413 02/28/16 1547 02/29/16 0041 03/01/16 0455 03/01/16 1635 03/02/16 0600  NA 140 146* 141 135 135  --  138  K 4.4 4.8 4.1 5.2* 4.6  --  4.6  CL 106  --   --  104 103  --  109  CO2 27  --   --  24 26  --  23  GLUCOSE 160* 71 116* 218* 330*  --  178*  BUN 20  --   --  16 24*  --  22*  CREATININE 0.84  --   --  0.76 1.18*  --  0.96  CALCIUM 9.7  --   --  8.2* 8.5*  --  8.1*  MG  --   --   --   --   --  1.9 2.0  PHOS  --   --   --   --   --  2.2* 3.3    Liver Function Tests:  Recent Labs Lab 02/26/16 1104  AST 28  ALT 38  ALKPHOS 61  BILITOT 0.5  PROT 6.8  ALBUMIN 3.5   No results for input(s): LIPASE, AMYLASE in the last 168 hours. No results for input(s): AMMONIA in the last 168 hours.  CBC:  Recent Labs Lab 02/26/16 1104  02/29/16 0042 03/01/16 0455 03/01/16 1635 03/01/16 2200 03/02/16 0600  WBC 9.0  --  15.3* 11.7* 10.8*  --  15.2*  NEUTROABS  --   --   --   --   --   --  11.9*  HGB 12.2  < > 8.9* 7.3* 6.0* 7.4* 7.1*  HCT 39.6  < > 28.1* 22.8* 18.8* 23.4* 22.7*  MCV 86.5  --  85.7 85.4 85.1  --  86.6  PLT 188  --  157 170 131*  --  143*  < > = values in this interval not displayed.  Cardiac Enzymes: No results for input(s): CKTOTAL, CKMB, CKMBINDEX, TROPONINI in the last 168 hours.  Lipid Panel:  Recent Labs Lab 03/01/16 1634 03/02/16 0600  CHOL  --  109  TRIG 91 121  HDL  --  32*  CHOLHDL  --  3.4  VLDL  --  24  LDLCALC  --  53    CBG:  Recent Labs Lab 03/02/16 0050 03/02/16 0203 03/02/16 0303 03/02/16 0725 03/02/16 1140  GLUCAP 132* 131* 131* 172* 216*    Microbiology: Results for orders placed or performed during the hospital encounter of 02/28/16  Culture, respiratory (NON-Expectorated)     Status: None (Preliminary result)   Collection Time: 03/02/16  6:00 AM  Result Value Ref Range Status   Specimen Description TRACHEAL ASPIRATE  Final   Special Requests Normal   Final   Gram Stain   Final    FEW WBC PRESENT,BOTH PMN AND MONONUCLEAR FEW GRAM NEGATIVE RODS FEW GRAM NEGATIVE COCCI IN PAIRS RARE GRAM POSITIVE COCCI IN PAIRS    Culture PENDING  Incomplete   Report Status PENDING  Incomplete    Coagulation Studies: No results for input(s): LABPROT, INR in the last 72 hours.  Imaging: Ct Angio Head W Or Wo Contrast  Result Date: 03/01/2016 CLINICAL DATA:  Right-sided weakness and aphasia. Femoral popliteal bypass performed yesterday. EXAM: CT ANGIOGRAPHY HEAD AND NECK.  CT perfusion. TECHNIQUE: Multidetector CT imaging of the head and neck was performed using the standard protocol during bolus administration of intravenous contrast. Multiplanar CT image reconstructions and MIPs were obtained to evaluate the vascular anatomy. Carotid stenosis measurements (when applicable) are obtained utilizing NASCET criteria, using the distal internal carotid diameter as the denominator. CONTRAST:  100 cc Isovue 370 COMPARISON:  CT earlier same day. FINDINGS: CTA NECK Aortic arch: Mild atherosclerosis.  No aneurysm or dissection. Right carotid system: Common carotid artery is patent to the bifurcation. Calcified plaque along the medial margin of the distal CCA but no stenosis. Carotid bifurcation widely patent without stenosis of the ICA. Cervical ICA widely patent. Left carotid system: Left common carotid artery widely patent to the bifurcation. Soft plaque at the carotid bifurcation with near occlusion at the  left ICA origin. Persistent antegrade flow within the left ICA which is markedly narrowed because of reduced inflow. No ECA stenosis. Vertebral arteries:Both vertebral artery origins widely patent. Both vertebral arteries are widely patent through the cervical region. Skeleton: Ordinary spondylosis Other neck: Intubated. No significant soft tissue lesion in the neck. Lung apices are clear. CTA HEAD Anterior circulation: Both internal carotid arteries are patent through  the skullbase. There is extensive atherosclerotic calcification in both carotid siphon regions. Stenosis is estimated at 50% in both carotid siphon regions. Supra clinoid internal carotid arteries are also narrowed 50%. On the right, the anterior and middle cerebral arteries are widely patent. Diminutive A1 segment on the left with both anterior cerebral arteries receiving most of there supply from the right carotid circulation. On the left, there is no M1 stenosis or missing M2 branch identified. Patent posterior communicating artery on the left. Posterior circulation: Both vertebral arteries are patent through the foramen magnum to the basilar. Posterior inferior cerebellar arteries are patent. No basilar stenosis. Superior cerebellar and posterior cerebral arteries are patent bilaterally. Right PCA stenosis 1 cm beyond its origin, 50-70%. Venous sinuses: Patent and normal Anatomic variants: Diminutive A1 segment on the left as noted above. 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 CT perfusion: Cerebral blood flow is normal to the right hemisphere in the visualized posterior fossa. There is diminished cerebral blood flow throughout the left MCA territory. Mean transit time is delayed throughout the left MCA territory. Cerebral blood volume remains normal. Time to peak is delayed throughout the left MCA territory. At risk/penumbra brain throughout the left MCA territory. No sign of completed infarction. 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 Reduced caliber of the left cervical internal carotid artery because of reduced inflow. 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 Stenoses estimated at 50% bilaterally. Probable serial stenoses. No M1 stenosis and no missing M2 branches on the left. At risk brain in the left MCA territory showing normal cerebral blood volume but  decreased cerebral blood flow, mean transit time and time to peak. No evidence of completed infarction. These results were called by telephone at the time of interpretation on 03/01/2016 at 10:27 am to Dr. Elson Clan , who verbally acknowledged these results. Electronically Signed   By: Nelson Chimes M.D.   On: 03/01/2016 10:54  Ct Head Wo Contrast  Result Date: 03/01/2016 CLINICAL DATA:  Post intervention. Code stroke. Right-sided weakness. EXAM: CT HEAD WITHOUT CONTRAST TECHNIQUE: Contiguous axial images were obtained from the base of the skull through the vertex without intravenous contrast. COMPARISON:  Multiple examinations earlier same day FINDINGS: No change since the previous study. No evidence of acute infarction, mass lesion, hemorrhage, hydrocephalus or extra-axial collection. Chronic small-vessel ischemic changes of the hemispheric white matter. Stents of calcification in the carotid siphon regions. Some mucosal inflammatory changes in the paranasal sinuses. 1 cm meningioma along the floor of the posterior fossa on the right without significant mass effect upon the brain. IMPRESSION: No change or acute finding following left carotid artery stent. Electronically Signed   By: Nelson Chimes M.D.   On: 03/01/2016 15:39  Ct Angio Neck W Or Wo Contrast  Result Date: 03/01/2016 CLINICAL DATA:  Right-sided weakness and aphasia. Femoral popliteal bypass performed yesterday. EXAM: CT ANGIOGRAPHY HEAD AND NECK.  CT perfusion. TECHNIQUE: Multidetector CT imaging of the head and neck was performed using the  standard protocol during bolus administration of intravenous contrast. Multiplanar CT image reconstructions and MIPs were obtained to evaluate the vascular anatomy. Carotid stenosis measurements (when applicable) are obtained utilizing NASCET criteria, using the distal internal carotid diameter as the denominator. CONTRAST:  100 cc Isovue 370 COMPARISON:  CT earlier same day. FINDINGS: CTA NECK Aortic  arch: Mild atherosclerosis.  No aneurysm or dissection. Right carotid system: Common carotid artery is patent to the bifurcation. Calcified plaque along the medial margin of the distal CCA but no stenosis. Carotid bifurcation widely patent without stenosis of the ICA. Cervical ICA widely patent. Left carotid system: Left common carotid artery widely patent to the bifurcation. Soft plaque at the carotid bifurcation with near occlusion at the left ICA origin. Persistent antegrade flow within the left ICA which is markedly narrowed because of reduced inflow. No ECA stenosis. Vertebral arteries:Both vertebral artery origins widely patent. Both vertebral arteries are widely patent through the cervical region. Skeleton: Ordinary spondylosis Other neck: Intubated. No significant soft tissue lesion in the neck. Lung apices are clear. CTA HEAD Anterior circulation: Both internal carotid arteries are patent through the skullbase. There is extensive atherosclerotic calcification in both carotid siphon regions. Stenosis is estimated at 50% in both carotid siphon regions. Supra clinoid internal carotid arteries are also narrowed 50%. On the right, the anterior and middle cerebral arteries are widely patent. Diminutive A1 segment on the left with both anterior cerebral arteries receiving most of there supply from the right carotid circulation. On the left, there is no M1 stenosis or missing M2 branch identified. Patent posterior communicating artery on the left. Posterior circulation: Both vertebral arteries are patent through the foramen magnum to the basilar. Posterior inferior cerebellar arteries are patent. No basilar stenosis. Superior cerebellar and posterior cerebral arteries are patent bilaterally. Right PCA stenosis 1 cm beyond its origin, 50-70%. Venous sinuses: Patent and normal Anatomic variants: Diminutive A1 segment on the left as noted above. Delayed phase: 1 cm meningioma arising from the posterior fossa floor on  the right without significant mass effect. CT perfusion: Cerebral blood flow is normal to the right hemisphere in the visualized posterior fossa. There is diminished cerebral blood flow throughout the left MCA territory. Mean transit time is delayed throughout the left MCA territory. Cerebral blood volume remains normal. Time to peak is delayed throughout the left MCA territory. At risk/penumbra brain throughout the left MCA territory. No sign of completed infarction. 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 Reduced caliber of the left cervical internal carotid artery because of reduced inflow. 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 Stenoses estimated at 50% bilaterally. Probable serial stenoses. No M1 stenosis and no missing M2 branches on the left. At risk brain in the left MCA territory showing normal cerebral blood volume but decreased cerebral blood flow, mean transit time and time to peak. No evidence of completed infarction. These results were called by telephone at the time of interpretation on 03/01/2016 at 10:27 am to Dr. Elson Clan , who verbally acknowledged these results. Electronically Signed   By: Nelson Chimes M.D.   On: 03/01/2016 10:54  Mr Brain Wo Contrast  Result Date: 03/02/2016 CLINICAL DATA:  Initial evaluation for acute altered mental status, right-sided weakness, found to have near occlusion of the left ICA, status post stent assisted angioplasty with revascularization. EXAM: MRI HEAD WITHOUT CONTRAST MRA HEAD WITHOUT CONTRAST TECHNIQUE: Multiplanar, multiecho pulse sequences of the brain and surrounding structures were obtained  without intravenous contrast. Angiographic images of the head were obtained using MRA technique without contrast. COMPARISON:  Prior CT and CTA from 03/01/2016. FINDINGS: MRI HEAD FINDINGS Diffuse prominence of the CSF containing spaces is compatible with generalized age-related  cerebral atrophy. Patchy T2/FLAIR hyperintensity within the periventricular and deep white matter both cerebral hemispheres most consistent with chronic small vessel ischemic disease, mild for age. There is patchy multifocal restricted diffusion involving the left lentiform nucleus and left internal capsule, compatible with acute ischemic infarct (series 4, image 23, 27). Superior extension into the periventricular white matter of the left corona radiata/centrum semi ovale. The no associated hemorrhage. Few additional punctate foci of restricted diffusion within the cortical gray matter of the left occipital lobe (series 4, image 28, 25). Additional small 5 mm ischemic infarct within the mid right centrum semi ovale I (series 4, image 34). Punctate cortical infarct right parietal lobe, motor strip (series 4, image 40). Additional punctate cortical infarct right occipital lobe (series 4, image 28). Patchy restricted diffusion within the right cerebellar hemisphere (series 4, image 13). No associated hemorrhage or mass effect. Gray-white matter differentiation otherwise maintained. Major intracranial vascular flow voids are preserved. No midline shift or mass effect. No hydrocephalus. Within the right cerebellar hemisphere noted, consistent with large calcifications seen within this region on prior CT. This may reflect a densely calcified meningioma. No associated mass effect. No other mass lesion. Apparent scattered serpiginous FLAIR signal intensity seen within multiple cortical sulci noted, suspected to be related to recent angiogram and/or supplemental oxygen therapy. No signal abnormality seen on gradient echo sequence to suggest subarachnoid hemorrhage. In addition, no hemorrhage seen on previous CTs. Craniocervical junction normal. Visualized upper cervical spine unremarkable. Pituitary gland normal. No acute abnormality about the globes and orbits. Patient is status post lens extraction bilaterally. Scattered  mucosal thickening throughout the paranasal sinuses. Fluid within nasopharynx. Patient likely intubated. Small bilateral mastoid effusions noted. Inner ear structures normal. Bone marrow signal intensity within normal limits. No scalp soft tissue abnormality. MRA HEAD FINDINGS ANTERIOR CIRCULATION: Distal cervical segments of the internal carotid arteries are patent with antegrade flow. Left ICA slightly smaller as compared to the right. Petrous segments patent bilaterally. Scattered multifocal atheromatous irregularity within the cavernous/ supraclinoid ICAs, left worse than right. There is a superimposed focal moderate to severe stenosis left supraclinoid ICA (series 6, image 52). No flow-limiting stenosis on the right. Left A1 segment largely hypoplastic/absent, which likely accounts for the diminutive left ICA. Moderate stenosis at the origin of the right A1 segment. Anterior communicating artery normal. Anterior cerebral arteries demonstrate multifocal irregularity with mild to moderate stenoses. M1 segments demonstrate multi focal atheromatous irregularity without high-grade flow-limiting stenosis or occlusion. Distal MCA branches well perfused and symmetric. Distal small vessel atheromatous irregularity within the MCA branches bilaterally. POSTERIOR CIRCULATION: Vertebral arteries patent to the vertebrobasilar junction. Posterior inferior cerebral arteries patent proximally. Basilar artery patent to its distal aspect. Superior cerebellar arteries demonstrate atheromatous irregularity but are patent bilaterally. Moderate multifocal stenoses proximal left P1 and P2 segments. Left PCA demonstrates multifocal atheromatous irregularity but is patent to its distal aspect. Small posterior communicating artery noted on the left. Severe stenosis proximal right PCA with attenuated flow distally. No aneurysm or vascular malformation. IMPRESSION: MRI HEAD IMPRESSION: 1. Acute patchy ischemic infarcts involving left basal  ganglia and internal capsule, with extension into periventricular white matter of the left corona radiata/centrum semi ovale. No associated hemorrhage or mass effect. 2. Additional scattered small punctate cortical and white  matter infarcts involving the bilateral cerebral hemispheres and right cerebellum as above. No associated hemorrhage or mass effect. 3. 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 No associated edema. MRA HEAD IMPRESSION: 1. No large vessel occlusion within the intracranial circulation. 2. Short-segment severe stenosis at the supraclinoid left ICA. 3. Severe stenosis proximal right PCA with attenuated flow distally. 4. Additional multi focal atheromatous irregularity involving the anterior posterior circulations as above without severe or correctable stenosis. 5. Hypoplastic/absent left A1 segment, likely accounting for the diminutive left ICA is compared to the right. Electronically Signed   By: Jeannine Boga M.D.   On: 03/02/2016 06:59  Ct Cerebral Perfusion W Contrast  Result Date: 03/01/2016 CLINICAL DATA:  Right-sided weakness and aphasia. Femoral popliteal bypass performed yesterday. EXAM: CT ANGIOGRAPHY HEAD AND NECK.  CT perfusion. TECHNIQUE: Multidetector CT imaging of the head and neck was performed using the standard protocol during bolus administration of intravenous contrast. Multiplanar CT image reconstructions and MIPs were obtained to evaluate the vascular anatomy. Carotid stenosis measurements (when applicable) are obtained utilizing NASCET criteria, using the distal internal carotid diameter as the denominator. CONTRAST:  100 cc Isovue 370 COMPARISON:  CT earlier same day. FINDINGS: CTA NECK Aortic arch: Mild atherosclerosis.  No aneurysm or dissection. Right carotid system: Common carotid artery is patent to the bifurcation. Calcified plaque along the medial margin of the distal CCA but no stenosis. Carotid  bifurcation widely patent without stenosis of the ICA. Cervical ICA widely patent. Left carotid system: Left common carotid artery widely patent to the bifurcation. Soft plaque at the carotid bifurcation with near occlusion at the left ICA origin. Persistent antegrade flow within the left ICA which is markedly narrowed because of reduced inflow. No ECA stenosis. Vertebral arteries:Both vertebral artery origins widely patent. Both vertebral arteries are widely patent through the cervical region. Skeleton: Ordinary spondylosis Other neck: Intubated. No significant soft tissue lesion in the neck. Lung apices are clear. CTA HEAD Anterior circulation: Both internal carotid arteries are patent through the skullbase. There is extensive atherosclerotic calcification in both carotid siphon regions. Stenosis is estimated at 50% in both carotid siphon regions. Supra clinoid internal carotid arteries are also narrowed 50%. On the right, the anterior and middle cerebral arteries are widely patent. Diminutive A1 segment on the left with both anterior cerebral arteries receiving most of there supply from the right carotid circulation. On the left, there is no M1 stenosis or missing M2 branch identified. Patent posterior communicating artery on the left. Posterior circulation: Both vertebral arteries are patent through the foramen magnum to the basilar. Posterior inferior cerebellar arteries are patent. No basilar stenosis. Superior cerebellar and posterior cerebral arteries are patent bilaterally. Right PCA stenosis 1 cm beyond its origin, 50-70%. Venous sinuses: Patent and normal Anatomic variants: Diminutive A1 segment on the left as noted above. 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 CT perfusion: Cerebral blood flow is normal to the right hemisphere in the visualized posterior fossa. There is diminished cerebral blood flow throughout the left MCA  territory. Mean transit time is delayed throughout the left MCA territory. Cerebral blood volume remains normal. Time to peak is delayed throughout the left MCA territory. At risk/penumbra brain throughout the left MCA territory. No sign of completed infarction. 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 Reduced caliber of the left cervical internal carotid artery because of reduced  inflow. 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 Stenoses estimated at 50% bilaterally. Probable serial stenoses. No M1 stenosis and no missing M2 branches on the left. At risk brain in the left MCA territory showing normal cerebral blood volume but decreased cerebral blood flow, mean transit time and time to peak. No evidence of completed infarction. These results were called by telephone at the time of interpretation on 03/01/2016 at 10:27 am to Dr. Elson Clan , who verbally acknowledged these results. Electronically Signed   By: Nelson Chimes M.D.   On: 03/01/2016 10:54  Dg Chest Port 1 View  Result Date: 03/01/2016 CLINICAL DATA:  Acute respiratory failure. New right-sided central line placement. EXAM: PORTABLE CHEST 1 VIEW COMPARISON:  Radiographs. FINDINGS: Cardiomegaly and pulmonary vascular congestion identified. Endotracheal tube with tip 3.6 cm above the carina, right IJ central venous catheter with tip overlying the superior cavoatrial junction, and NG tube entering the stomach with tip off the field of view noted. Left basilar atelectasis identified. There is no evidence pneumothorax. IMPRESSION: Support apparatus as described. Right IJ central venous catheter with tip overlying the superior cavoatrial junction. Cardiomegaly with pulmonary vascular congestion and left basilar atelectasis. Electronically Signed   By: Margarette Canada M.D.   On: 03/01/2016 15:50  Dg Abd Portable 1v  Result Date: 03/01/2016 CLINICAL DATA:  OG tube placement. EXAM:  PORTABLE ABDOMEN - 1 VIEW COMPARISON:  None. FINDINGS: An OG tube is noted with tip overlying the peripyloric region. The bowel gas pattern is unremarkable. Left lower lung airspace disease versus atelectasis noted. IMPRESSION: OG tube with tip overlying the peripyloric region. Left lower lung atelectasis versus airspace disease. Electronically Signed   By: Margarette Canada M.D.   On: 03/01/2016 16:20  Mr Jodene Nam Head/brain [id number] Cm  Result Date: 03/02/2016 CLINICAL DATA:  Initial evaluation for acute altered mental status, right-sided weakness, found to have near occlusion of the left ICA, status post stent assisted angioplasty with revascularization. EXAM: MRI HEAD WITHOUT CONTRAST MRA HEAD WITHOUT CONTRAST TECHNIQUE: Multiplanar, multiecho pulse sequences of the brain and surrounding structures were obtained without intravenous contrast. Angiographic images of the head were obtained using MRA technique without contrast. COMPARISON:  Prior CT and CTA from 03/01/2016. FINDINGS: MRI HEAD FINDINGS Diffuse prominence of the CSF containing spaces is compatible with generalized age-related cerebral atrophy. Patchy T2/FLAIR hyperintensity within the periventricular and deep white matter both cerebral hemispheres most consistent with chronic small vessel ischemic disease, mild for age. There is patchy multifocal restricted diffusion involving the left lentiform nucleus and left internal capsule, compatible with acute ischemic infarct (series 4, image 23, 27). Superior extension into the periventricular white matter of the left corona radiata/centrum semi ovale. The no associated hemorrhage. Few additional punctate foci of restricted diffusion within the cortical gray matter of the left occipital lobe (series 4, image 28, 25). Additional small 5 mm ischemic infarct within the mid right centrum semi ovale I (series 4, image 34). Punctate cortical infarct right parietal lobe, motor strip (series 4, image 40). Additional punctate  cortical infarct right occipital lobe (series 4, image 28). Patchy restricted diffusion within the right cerebellar hemisphere (series 4, image 13). No associated hemorrhage or mass effect. Gray-white matter differentiation otherwise maintained. Major intracranial vascular flow voids are preserved. No midline shift or mass effect. No hydrocephalus. Within the right cerebellar hemisphere noted, consistent with large calcifications seen within this region on prior CT. This may reflect a densely calcified meningioma. No associated mass  effect. No other mass lesion. Apparent scattered serpiginous FLAIR signal intensity seen within multiple cortical sulci noted, suspected to be related to recent angiogram and/or supplemental oxygen therapy. No signal abnormality seen on gradient echo sequence to suggest subarachnoid hemorrhage. In addition, no hemorrhage seen on previous CTs. Craniocervical junction normal. Visualized upper cervical spine unremarkable. Pituitary gland normal. No acute abnormality about the globes and orbits. Patient is status post lens extraction bilaterally. Scattered mucosal thickening throughout the paranasal sinuses. Fluid within nasopharynx. Patient likely intubated. Small bilateral mastoid effusions noted. Inner ear structures normal. Bone marrow signal intensity within normal limits. No scalp soft tissue abnormality. MRA HEAD FINDINGS ANTERIOR CIRCULATION: Distal cervical segments of the internal carotid arteries are patent with antegrade flow. Left ICA slightly smaller as compared to the right. Petrous segments patent bilaterally. Scattered multifocal atheromatous irregularity within the cavernous/ supraclinoid ICAs, left worse than right. There is a superimposed focal moderate to severe stenosis left supraclinoid ICA (series 6, image 52). No flow-limiting stenosis on the right. Left A1 segment largely hypoplastic/absent, which likely accounts for the diminutive left ICA. Moderate stenosis at the  origin of the right A1 segment. Anterior communicating artery normal. Anterior cerebral arteries demonstrate multifocal irregularity with mild to moderate stenoses. M1 segments demonstrate multi focal atheromatous irregularity without high-grade flow-limiting stenosis or occlusion. Distal MCA branches well perfused and symmetric. Distal small vessel atheromatous irregularity within the MCA branches bilaterally. POSTERIOR CIRCULATION: Vertebral arteries patent to the vertebrobasilar junction. Posterior inferior cerebral arteries patent proximally. Basilar artery patent to its distal aspect. Superior cerebellar arteries demonstrate atheromatous irregularity but are patent bilaterally. Moderate multifocal stenoses proximal left P1 and P2 segments. Left PCA demonstrates multifocal atheromatous irregularity but is patent to its distal aspect. Small posterior communicating artery noted on the left. Severe stenosis proximal right PCA with attenuated flow distally. No aneurysm or vascular malformation. IMPRESSION: MRI HEAD IMPRESSION: 1. Acute patchy ischemic infarcts involving left basal ganglia and internal capsule, with extension into periventricular white matter of the left corona radiata/centrum semi ovale. No associated hemorrhage or mass effect. 2. Additional scattered small punctate cortical and white matter infarcts involving the bilateral cerebral hemispheres and right cerebellum as above. No associated hemorrhage or mass effect. 3. 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 No associated edema. MRA HEAD IMPRESSION: 1. No large vessel occlusion within the intracranial circulation. 2. Short-segment severe stenosis at the supraclinoid left ICA. 3. Severe stenosis proximal right PCA with attenuated flow distally. 4. Additional multi focal atheromatous irregularity involving the anterior posterior circulations as above without severe or correctable stenosis.  5. Hypoplastic/absent left A1 segment, likely accounting for the diminutive left ICA is compared to the right. Electronically Signed   By: Jeannine Boga M.D.   On: 03/02/2016 06:59  Ct Head Code Stroke Wo Contrast  Result Date: 03/01/2016 CLINICAL DATA:  Right-sided hemiplegia and aphasia EXAM: CT HEAD WITHOUT CONTRAST TECHNIQUE: Contiguous axial images were obtained from the base of the skull through the vertex without intravenous contrast. COMPARISON:  February 12, 2014 FINDINGS: There is age related volume loss. There is no appreciable intracranial mass, hemorrhage, extra-axial fluid collection, or midline shift. There is stable mild small vessel disease in the centra semiovale bilaterally. No acute infarct is evident. No hyperdense vessel is evident on this study. The middle cerebral artery attenuation appear symmetric bilaterally. There are scattered foci of calcification in the distal vertebral arteries. There is also calcification in the cavernous carotid artery an  carotid siphon regions. The bony calvarium appears intact. The mastoid air cells are clear. Visualized paranasal sinuses are clear. Orbits appear symmetric bilaterally. IMPRESSION: Age related volume loss with mild periventricular small vessel disease. No acute infarct evident currently. No edema or hemorrhage. No hyperdense vessel evident. Basal ganglia calcification is physiologic. Critical Value/emergent results were called by telephone at the time of interpretation on 03/01/2016 at 8:49 am to Dr. Elson Clan , who verbally acknowledged these results. Electronically Signed   By: Lowella Grip III M.D.   On: 03/01/2016 08:49   Assessment/Plan:  [name] had evidence of the onset of aphasia with right hemiplegia. 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 The stroke team was activated. [name] was taken to CT where she underwent a CT of the brain, CT angiogram, and CT perfusion  studies.  Throughout the course of these events multiple members of the stroke team were involved. Communication was ongoing regarding the plan for additional therapy. During the course of this [name] was noted to have labored breathing and anesthesia was consult for placement of an endotracheal tube. The results of the evaluations indicated that interventional neuroradiology would be an [name] best interest.  Dr. Dossie Der noted a clot at the origin of the left internal carotid artery in addition to severe stenosis. A stent was placed. [name] was subsequently taken to the neuro ICU for additional supportive care.   James A. Tasia Catchings, M.D. Neurohospitalist Phone: [phone]  03/02/2016, 6:03 PM

## 2016-03-03 ENCOUNTER — Inpatient Hospital Stay (HOSPITAL_COMMUNITY): Payer: Medicare HMO

## 2016-03-03 DIAGNOSIS — D62 Acute posthemorrhagic anemia: Secondary | ICD-10-CM

## 2016-03-03 DIAGNOSIS — D509 Iron deficiency anemia, unspecified: Secondary | ICD-10-CM

## 2016-03-03 LAB — OCCULT BLOOD X 1 CARD TO LAB, STOOL: Fecal Occult Bld: NEGATIVE

## 2016-03-03 LAB — PROCALCITONIN: Procalcitonin: 0.28 ng/mL

## 2016-03-03 LAB — BASIC METABOLIC PANEL
ANION GAP: 5 (ref 5–15)
BUN: 36 mg/dL — ABNORMAL HIGH (ref 6–20)
CALCIUM: 8.2 mg/dL — AB (ref 8.9–10.3)
CO2: 23 mmol/L (ref 22–32)
Chloride: 112 mmol/L — ABNORMAL HIGH (ref 101–111)
Creatinine, Ser: 1.28 mg/dL — ABNORMAL HIGH (ref 0.44–1.00)
GFR, EST AFRICAN AMERICAN: 47 mL/min — AB (ref >60)
GFR, EST NON AFRICAN AMERICAN: 40 mL/min — AB (ref >60)
GLUCOSE: 276 mg/dL — AB (ref 65–99)
POTASSIUM: 4.2 mmol/L (ref 3.5–5.1)
Sodium: 140 mmol/L (ref 135–145)

## 2016-03-03 LAB — CBC WITH DIFFERENTIAL/PLATELET
BASOS PCT: 0 %
Basophils Absolute: 0 10*3/uL (ref 0.0–0.1)
EOS ABS: 0 10*3/uL (ref 0.0–0.7)
EOS PCT: 0 %
HCT: 21.1 % — ABNORMAL LOW (ref 36.0–46.0)
HEMOGLOBIN: 6.6 g/dL — AB (ref 12.0–15.0)
LYMPHS PCT: 10 %
Lymphs Abs: 1.1 10*3/uL (ref 0.7–4.0)
MCH: 27.7 pg (ref 26.0–34.0)
MCHC: 31.3 g/dL (ref 30.0–36.0)
MCV: 88.7 fL (ref 78.0–100.0)
MONO ABS: 1 10*3/uL (ref 0.1–1.0)
Monocytes Relative: 9 %
NEUTROS PCT: 81 %
Neutro Abs: 9.2 10*3/uL — ABNORMAL HIGH (ref 1.7–7.7)
PLATELETS: 148 10*3/uL — AB (ref 150–400)
RBC: 2.38 MIL/uL — AB (ref 3.87–5.11)
RDW: 17 % — ABNORMAL HIGH (ref 11.5–15.5)
WBC: 11.3 10*3/uL — AB (ref 4.0–10.5)

## 2016-03-03 LAB — GLUCOSE, CAPILLARY
GLUCOSE-CAPILLARY: 225 mg/dL — AB (ref 65–99)
Glucose-Capillary: 181 mg/dL — ABNORMAL HIGH (ref 65–99)
Glucose-Capillary: 222 mg/dL — ABNORMAL HIGH (ref 65–99)
Glucose-Capillary: 304 mg/dL — ABNORMAL HIGH (ref 65–99)
Glucose-Capillary: 316 mg/dL — ABNORMAL HIGH (ref 65–99)
Glucose-Capillary: 279 mg/dL — ABNORMAL HIGH (ref 65–99)
Glucose-Capillary: 270 mg/dL — ABNORMAL HIGH (ref 65–99)

## 2016-03-03 LAB — CBC
HEMATOCRIT: 26 % — AB (ref 36.0–46.0)
HEMOGLOBIN: 8.2 g/dL — AB (ref 12.0–15.0)
MCH: 28.5 pg (ref 26.0–34.0)
MCHC: 31.5 g/dL (ref 30.0–36.0)
MCV: 90.3 fL (ref 78.0–100.0)
Platelets: 153 10*3/uL (ref 150–400)
RBC: 2.88 MIL/uL — AB (ref 3.87–5.11)
RDW: 16.4 % — ABNORMAL HIGH (ref 11.5–15.5)
WBC: 12.3 10*3/uL — AB (ref 4.0–10.5)

## 2016-03-03 LAB — RENAL FUNCTION PANEL
ANION GAP: 5 (ref 5–15)
Albumin: 2.1 g/dL — ABNORMAL LOW (ref 3.5–5.0)
BUN: 36 mg/dL — ABNORMAL HIGH (ref 6–20)
CALCIUM: 8.2 mg/dL — AB (ref 8.9–10.3)
CHLORIDE: 112 mmol/L — AB (ref 101–111)
CO2: 23 mmol/L (ref 22–32)
Creatinine, Ser: 1.25 mg/dL — ABNORMAL HIGH (ref 0.44–1.00)
GFR, EST AFRICAN AMERICAN: 48 mL/min — AB (ref >60)
GFR, EST NON AFRICAN AMERICAN: 42 mL/min — AB (ref >60)
Glucose, Bld: 277 mg/dL — ABNORMAL HIGH (ref 65–99)
Phosphorus: 3.4 mg/dL (ref 2.5–4.6)
Potassium: 4.2 mmol/L (ref 3.5–5.1)
Sodium: 140 mmol/L (ref 135–145)

## 2016-03-03 LAB — PHOSPHORUS: Phosphorus: 3.4 mg/dL (ref 2.5–4.6)

## 2016-03-03 LAB — MAGNESIUM: Magnesium: 2.2 mg/dL (ref 1.7–2.4)

## 2016-03-03 LAB — BRAIN NATRIURETIC PEPTIDE: B NATRIURETIC PEPTIDE 5: 225.6 pg/mL — AB (ref 0.0–100.0)

## 2016-03-03 LAB — TRIGLYCERIDES: TRIGLYCERIDES: 161 mg/dL — AB (ref <150)

## 2016-03-03 LAB — PREPARE RBC (CROSSMATCH)

## 2016-03-03 MED ORDER — INSULIN DETEMIR 100 UNIT/ML ~~LOC~~ SOLN
15.0000 [IU] | Freq: Two times a day (BID) | SUBCUTANEOUS | Status: DC
  Administered 2016-03-03 – 2016-03-04 (×2): 15 [IU] via SUBCUTANEOUS
  Filled 2016-03-03 (×3): qty 0.15

## 2016-03-03 MED ORDER — FUROSEMIDE 10 MG/ML IJ SOLN
20.0000 mg | Freq: Once | INTRAMUSCULAR | Status: AC
  Administered 2016-03-03: 20 mg via INTRAVENOUS
  Filled 2016-03-03: qty 2

## 2016-03-03 MED ORDER — CHLORHEXIDINE GLUCONATE 0.12 % MT SOLN
OROMUCOSAL | Status: AC
  Filled 2016-03-03: qty 15

## 2016-03-03 MED ORDER — SODIUM CHLORIDE 0.9 % IV SOLN: Freq: Once | INTRAVENOUS | Status: DC

## 2016-03-03 MED ORDER — FERROUS SULFATE 300 (60 FE) MG/5ML PO SYRP
300.0000 mg | ORAL_SOLUTION | Freq: Three times a day (TID) | ORAL | Status: DC
  Administered 2016-03-03 – 2016-03-13 (×29): 300 mg via ORAL
  Filled 2016-03-03 (×36): qty 5

## 2016-03-03 NOTE — Progress Notes (Signed)
Patient transported to CT and back on ventilator. Vitals stable throughout.

## 2016-03-03 NOTE — Progress Notes (Signed)
PULMONARY / CRITICAL CARE MEDICINE   Name: [name] MRN: [id number] DOB: 21-May-1941    ADMISSION DATE:  02/28/2016 CONSULTATION DATE:  03/01/16  REFERRING MD:  Dr. Leonie Man / Stroke MD   CHIEF COMPLAINT:  CVA, Acute Respiratory Failure   HISTORY OF PRESENT ILLNESS:  75 y/o M with PMH of DM, diabetic neuropathy, GERD, HLD, HTN, aortic stenosis who was admitted on 7/26 for right popliteal bypass graft in the setting of chronic right lower extremity wound.  Angiography in 12/2015 revealed bilateral extensive tibial disease.  The patient was initially recommended amputation however, she refused.  On 7/26 she underwent a right below the knee popliteal artery to peroneal artery bypass. Postoperatively she was treated with heparin and transitioned to Plavix on 7/27. JP drain postoperatively had high output. She was evaluated by physical therapy and recommended for skilled nursing facility placement. Postoperatively there were concern for weak doppler flow. On 7/28, she was noted to have worsening of peroneal signal consistent with early failure and anemia requiring transfusion of 2 units PRBCs. Palliative amputation was recommended.  On re-rounding, the patient was noted to have altered mental status with right-sided weakness and left gaze. Code Stroke called for further evaluation.  Upon*team evaluation, she was noted to have incomprehensible speech and moaning as well as right hemiplegia. She was premedicated with Solu-Medrol and Benadryl for imaging given her history of iodine allergy. Imaging delayed due to difficulty obtaining IV access. She was emergently taken to neuro-interventional radiology where she underwent procedure for revascularization (stent assisted angioplasty) of her left ICA in the setting of thrombus.  The patient was intubated and returned to ICU for further observation.  PCCM consulted for ICU assistance  SUBJECTIVE:  No acute events overnight. Decreased the prior perfusion and right  lower extremity today. Patient transfused 1 unit of packed red blood cells this morning for anemia. Patient spontaneously moving extremities.  REVIEW OF SYSTEMS:  Unable to assess due to intubation & sedation.  VITAL SIGNS: BP (!) 116/57   Pulse 71   Temp 98 F (36.7 C) (Axillary)   Resp 16   Ht 5\' 5"  (1.651 m)   Wt 253 lb 8.5 oz (115 kg)   SpO2 100%   BMI 42.19 kg/m   HEMODYNAMICS:    VENTILATOR SETTINGS: Vent Mode: PSV;CPAP FiO2 (%):  [30 %] 30 % Set Rate:  [18 bmp] 18 bmp Vt Set:  [450 mL] 450 mL PEEP:  [5 cmH20] 5 cmH20 Pressure Support:  [5 cmH20-10 cmH20] 10 cmH20 Plateau Pressure:  [21 cmH20-23 cmH20] 21 cmH20  INTAKE / OUTPUT: I/O last 3 completed shifts: In: 3943.3 [I.V.:3172.2; Blood:60; NG/GT:561.1; IV Piggyback:150] Out: [id number] [Urine:1815]  PHYSICAL EXAMINATION: General:  Obese female. Family at bedside.  Neuro:  Sedated on ventilator. Again spontaneously moving all 4 extremities. Not following commands on sedation. Pupils symmetric and equal. HEENT:  Endotracheal tube in place. No scleral icterus. Moist mucous membranes. Cardiovascular:  Regular rate. Sinus rhythm on telemetry. Bilateral lower extremity edema persists and unchanged. Pulmonary: Coarse breath sounds bilaterally. Symmetric chest wall rise on ventilator. Increased work of breathing on pressure support trial. Abdomen:  Protuberant. Hypoactive bowel sounds. Soft. Skin:  Warm and dry. No rash on exposed skin.  LABS:  BMET  Recent Labs Lab 03/01/16 0455 03/02/16 0600 03/03/16 0512  NA 135 138 140  140  K 4.6 4.6 4.2  4.2  CL 103 109 112*  112*  CO2 26 23 23  23   BUN 24* 22* 36*  36*  CREATININE 1.18* 0.96 1.28*  1.25*  GLUCOSE 330* 178* 276*  277*    Electrolytes  Recent Labs Lab 03/01/16 0455  03/02/16 0600 03/02/16 1759 03/03/16 0512  CALCIUM 8.5*  --  8.1*  --  8.2*  8.2*  MG  --   < > 2.0 2.1 2.2  PHOS  --   < > 3.3 3.4 3.4  3.4  < > = values in this interval not  displayed.  CBC  Recent Labs Lab 03/01/16 1635 03/01/16 2200 03/02/16 0600 03/03/16 0512  WBC 10.8*  --  15.2* 11.3*  HGB 6.0* 7.4* 7.1* 6.6*  HCT 18.8* 23.4* 22.7* 21.1*  PLT 131*  --  143* 148*    Coag's  Recent Labs Lab 02/26/16 1104  APTT 31  INR 1.20    Sepsis Markers  Recent Labs Lab 03/01/16 2030 03/01/16 2042 03/01/16 2350 03/02/16 0600 03/03/16 0512  LATICACIDVEN  --  1.9 1.9  --   --   PROCALCITON 0.32  --   --  0.41 0.28    ABG  Recent Labs Lab 03/01/16 1623  PHART 7.379  PCO2ART 42.6  PO2ART 268.0*    Liver Enzymes  Recent Labs Lab 02/26/16 1104 03/03/16 0512  AST 28  --   ALT 38  --   ALKPHOS 61  --   BILITOT 0.5  --   ALBUMIN 3.5 2.1*    Cardiac Enzymes No results for input(s): TROPONINI, PROBNP in the last 168 hours.  Glucose  Recent Labs Lab 03/02/16 1140 03/02/16 1913 03/02/16 2307 03/03/16 0301 03/03/16 0717 03/03/16 1133  GLUCAP 216* 206* 188* 222* 304* 316*    Imaging Dg Chest Port 1 View  Result Date: 03/03/2016 CLINICAL DATA:  Respiratory failure.  Leukocytosis. EXAM: PORTABLE CHEST 1 VIEW COMPARISON:  03/01/2016 and prior exams FINDINGS: This is a low volume film. An endotracheal tube with tip 3.6 cm above the carina, OG tube entering the stomach with tip off the field of view, and right IJ central venous catheter with tip overlying the superior cavoatrial junction again noted. Pulmonary vascular congestion is again identified with probable trace bilateral pleural effusions. Bilateral lower lung atelectasis versus airspace disease has slightly increased. There is no evidence of pneumothorax. IMPRESSION: Slightly increasing bilateral lower lung opacities -question atelectasis versus airspace disease. Pulmonary vascular congestion with possible trace bilateral pleural effusions. Electronically Signed   By: Margarette Canada M.D.   On: 03/03/2016 07:44    STUDIES:  CTA Head / Neck 7/28: At risk brain in the L MCA  territory showing normal cerebral blood volume but decreased cerebral flow CT Cerebral Perfusion 7/28: near occlusion of the L ICA at the origin Abd X-ray 7/28: Enteric catheter with tip overlying the peripyloric region. Port CXR 7/28: Endotracheal tube in good position. Enteric feeding tube coursing below diaphragm. Right internal jugular central venous catheter in place. Low lung volumes. Bilateral basilar opacities. MRI/MRA of Brain 7/29: Acute patchy ischemic infarcts left basal ganglia & internal capsule with extension into periventricular white matter of left corona radiata/centrum semi-ovale. No hemorrhage or mass effect. Scattered small punctate cortical & white matter infarcts involving bilateral cerebral hemispheres & rites or bowel movement. No associated mass or hemorrhage. 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 No large vessel occlusion within intracranial circulation. Short segment severe stenosis left ICA. Severe stenosis proximal right PCA. Multifocal atheromatous irregularity involving anterior & posterior circulations without severe or correctable stenosis. TTE 7/29: Moderate concentric LVH. EF 55-60%. Grade 2  diastolic dysfunction. Cannot exclude wall motion abnormality. LA moderately dilated & RA normal in size. RV normal in size and function. Mild aortic regurgitation without stenosis. No mitral regurgitation or stenosis. No pulmonic stenosis. No tricuspid regurgitation. Small circumferential pericardial effusion.  Port CXR 7/30: Enteric feeding tube coursing below diaphragm. Endotracheal tube in good position. Bilateral basilar opacities unchanged. Right internal jugular central venous catheter unchanged in position.  MICROBIOLOGY: MRSA PCR 7/24:  Negative Tracheal Asp Ctx 7/29>>>  ANTIBIOTICS: None  SIGNIFICANT EVENTS: 7/26 - Admit for R popliteal artery to peroneal artery bypass 7/28 - Code Stroke called for R sided weakness, to neuro IR with near  occlusion of L ICA s/p revascularization     LINES/TUBES: OETT 7/28 >>  R IJ Dual Lumen 7/28 >>  Foley 7/26 >> L Fem Sheat 7/28 - 7/29 L Fem Art Line 7/28 - 7/29 R UE Midline IV 7/28 >>  ASSESSMENT / PLAN:  NEUROLOGIC A:   L ICA CVA - S/P Revascularization 7/28 Sedation on Ventilator  P:   RASS goal: -2 Propofol gtt Fentanyl gtt & IV prn Pain Management per Neuro IR/Stroke Service ASA 325mg  daily Plavix 75mg  daily Lyrica VT q8hr  PULMONARY A: Acute Hypoxic Respiratory Failure - Possible aspiration in setting of acute CVA.  P:   Full Vent Suppor Weaning FiO2 for Sat >92% Albuterol Neb prn Intermittent CXR & ABG Daily SBT / WUA once OK with neuro  Lasix 20mg  IV x1  CARDIOVASCULAR A:  H/O Hypertension  H/O Hyperlipidemia RLE Popliteal Artery to Peroneal Artery Bypass - Completed 7/26 H/O Peripheral Vascular Disease  P:  Vascular Surgery Following Continuous Telemetry Monitoring Vitals per unit protocol SBP goal per Neuro Continue Lipitor 20mg  daily, Coreg 25mg  bid, Lasix 80mg  bid, Cozaar 100mg  daily  RENAL A:   No acute issues.  P:   Monitoring UOP with Foley Trending electrolytes & renal function daily Replacing electrolytes as indicated  GASTROINTESTINAL A:   Obesity   P:   NPO Tube Feedings per Dietary Recommendations Pepcid IV q12hr Senna VT bid  HEMATOLOGIC A:   Anemia - S/P 2 units PRBC's 7/27 & 1u PRBC 7/28. Another 1u PRBC this AM. Thrombocytopenia - Mild. Likely consumption.   P:  Trending cell counts daily w/ CBC SCD on LLE for DVT  ASA + Plavix per Neurology  Transfuse for Hgb <7.0 Repeat CBC pending CT Abd/Pelvis w/o for possible RP bleed  INFECTIOUS A:   No evidence of acute infection.  P:   Holding on antibiotics Trending Procalcitonin per algorithm Awaiting Tracheal Aspirate Culture  ENDOCRINE A:   H/O DM - Hyperglycemia. Off insulin gtt. A1c 8.1.   P:   Starting Levemir 15u Russell q12hr (home dose 25u  q12hr) Accu-Checks q4hr  SSI per Moderate Dose Algorithm  FAMILY  - Updates: Husband, son, & daughter updated at bedside 7/30 by Dr. Ashok Cordia.  - Inter-disciplinary family meet or Palliative Care meeting due by:  8/4  TODAY'S SUMMARY:  75 y/o F with a PMH of DM, HTN & PVD admitted 7/26 for planned right popliteal artery to peroneal artery bypass.  Developed AMS, right sided weakness on 7/28 and found to have occlusion of L ICA s/p neuro revascularization per IR.  Increased abdominal breathing on pressure support trial. Checking CT abdomen/pelvis to evaluate for possible source of bleeding. Increasing subcutaneous insulin for control of hyperglycemia. Family updated at length at bedside.  I have spent a total of 39 minutes of critical care time today caring for  the patient and reviewing the patient's electronic medical record.  Sonia Baller Ashok Cordia, M.D. [facility] Pulmonary & Critical Care Pager:  [phone] After 3pm or if no response, call [phone] 03/03/2016, 1:02 PM

## 2016-03-03 NOTE — Progress Notes (Signed)
STROKE TEAM PROGRESS NOTE   HISTORY OF PRESENT ILLNESS (per record)  [name] is a 75 y.o. female with history of hypertension, hyperlipidemia, diabetes with diabetic neuropathy, aortic stenosis, and a nonhealing ischemic right foot ulcer status post right lower extremity bypass graft surgery performed 02/28/2016 presenting with aphasia, right hemiplegia, and left-sided gaze preference. She did not receive IV t-PA due to recent surgery.   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  RN at the bedside.  Overall RN feels her condition is gradually improving. She is intubated and sedated, but follows commands.  ROS cannot be done at this time.  No significant event overnight.  OBJECTIVE Temp:  [97.4 F (36.3 C)-98.7 F (37.1 C)] 98 F (36.7 C) (07/30 0830) Pulse Rate:  [45-74] 57 (07/30 0830) Cardiac Rhythm: Sinus bradycardia (07/29 2000) Resp:  [7-21] 11 (07/30 0830) BP: (89-152)/(29-106) 128/37 (07/30 0830) SpO2:  [98 %-100 %] 100 % (07/30 0830) FiO2 (%):  [30 %] 30 % (07/30 0758) Weight:  [115 kg (253 lb 8.5 oz)] 115 kg (253 lb 8.5 oz) (07/30 0248)  CBC:   Recent Labs Lab 03/02/16 0600 03/03/16 0512  WBC 15.2* 11.3*  NEUTROABS 11.9* 9.2*  HGB 7.1* 6.6*  HCT 22.7* 21.1*  MCV 86.6 88.7  PLT 143* 148*    Basic Metabolic Panel:   Recent Labs Lab 03/02/16 0600 03/02/16 1759 03/03/16 0512  NA 138  --  140  140  K 4.6  --  4.2   4.2  CL 109  --  112*  112*  CO2 23  --  23  23  GLUCOSE 178*  --  276*  277*  BUN 22*  --  36*  36*  CREATININE 0.96  --  1.28*  1.25*  CALCIUM 8.1*  --  8.2*  8.2*  MG 2.0 2.1 2.2  PHOS 3.3 3.4 3.4  3.4    Lipid Panel:     Component Value Date/Time   CHOL 109 03/02/2016 0600   TRIG 161 (H) 03/03/2016 0512   HDL 32 (L) 03/02/2016 0600   CHOLHDL 3.4 03/02/2016 0600   VLDL 24 03/02/2016 0600   LDLCALC 53 03/02/2016 0600   HgbA1c:  Lab Results  Component Value Date   HGBA1C 8.1 (H) 02/29/2016   Urine Drug Screen:     Component Value Date/Time   LABOPIA NONE DETECTED 02/12/2014 0110   COCAINSCRNUR NONE DETECTED 02/12/2014 0110   LABBENZ NONE DETECTED 02/12/2014 0110   AMPHETMU NONE DETECTED 02/12/2014 0110   THCU NONE DETECTED 02/12/2014 0110   LABBARB NONE DETECTED 02/12/2014 0110      IMAGING  Ct Angio Head and Neck W Or Wo Contrast 03/01/2016 Near occlusion of the left ICA at the origin. Reduced caliber of the left cervical internal carotid artery because of reduced inflow. 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 Stenoses estimated at 50% bilaterally. Probable serial stenoses. No M1 stenosis and  no missing M2 branches on the left. At risk brain in the left MCA territory showing normal cerebral blood volume but decreased cerebral blood flow, mean transit time and time to peak. No evidence of completed infarction.   Ct Head Wo Contrast 03/01/2016 No change or acute finding following left carotid artery stent.   Mr [name] Head/brain Wo Cm 03/02/2016  MRI HEAD  1. Acute patchy ischemic infarcts involving left basal ganglia and internal capsule, with extension into periventricular white matter of the left corona radiata/centrum semi ovale. No associated hemorrhage or mass effect.  2. Additional scattered small punctate cortical and white matter infarcts involving the bilateral cerebral hemispheres and right  cerebellum as above. No associated hemorrhage or mass effect.  3. 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 No associated edema.   MRA HEAD 1. No large vessel occlusion within the intracranial circulation.  2. Short-segment severe stenosis at the supraclinoid left ICA.  3. Severe stenosis proximal right PCA with attenuated flow distally.  4. Additional multi focal atheromatous irregularity involving the anterior posterior circulations as above without severe or correctable stenosis.  5. Hypoplastic/absent left A1 segment, likely accounting for the diminutive left ICA is compared to the right.   Ct Cerebral Perfusion W Contrast 03/01/2016 Near occlusion of the left ICA at the origin. Reduced caliber of the left cervical internal carotid artery because of reduced inflow. 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 Stenoses estimated at 50% bilaterally. Probable serial stenoses. No M1 stenosis and no missing M2 branches on the left. At risk brain in the left MCA territory showing normal cerebral blood volume but decreased cerebral blood flow, mean transit time and time to peak. No evidence of completed infarction.    Ct Head Code Stroke Wo Contrast 03/01/2016 Age related volume loss with mild periventricular small vessel disease. No acute infarct evident currently. No edema or hemorrhage. No hyperdense vessel evident. Basal ganglia calcification is physiologic.    Dg Chest Port 1 View 03/01/2016 Support apparatus as described. Right IJ central venous catheter with tip overlying the superior cavoatrial junction. Cardiomegaly with pulmonary vascular congestion and left basilar atelectasis.   Dg Chest Port 1 View 03/03/2016 Slightly increasing bilateral lower lung opacities -question atelectasis versus airspace disease. Pulmonary vascular congestion with possible trace bilateral  pleural effusions.   Dg Abd Portable 1v 03/01/2016 OG tube with tip overlying the peripyloric region. Left lower lung atelectasis versus airspace disease.   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 There was moderate   concentric hypertrophy. Systolic function was normal. The   estimated ejection fraction was in the range of 55% to 60%.   Although no diagnostic regional wall motion abnormality was   identified, this possibility cannot be completely excluded on the   basis of this study. Features are consistent with a pseudonormal   left ventricular filling pattern, with concomitant abnormal   relaxation  and increased filling pressure (grade 2 diastolic   dysfunction). - Aortic valve: There was mild stenosis. Valve area (VTI): 1.28   cm^2. Valve area (Vmax): 1.18 cm^2. Valve area (Vmean): 1.04   cm^2. - Left atrium: The atrium was moderately dilated. - Pericardium, extracardiac: A small pericardial effusion was   identified circumferential to the heart. Impressions: - There is an echo free space posterior the left ventricle that   most likely represents a left pleural effusion, but image quality   is poor. Cannot entirely exclude a loculated pericardial efusion.   Correlate with radiological studies.     PHYSICAL EXAM General - Obese, in NAD; sedated on propofol   HEENT:  NCAT, PERRL, sclera clear; ET tube in  place Cardiovascular - Regular rate and rhythm Pulmonary: CTA Abdomen: NT, ND, normal bowel sounds Extremities: Bandage on right foot  Neurological Exam Mental Status: Sedated on propofol.  Followed simple commands for RN.  On slightly more Propofol for may exam.  Not following as well Orientation:  Not tested due to intubation and sedation Speech:  Not tested  Cranial Nerves:  PERRL; OCRs intact; face grossly symmetric,; spontaneous cough noted  Motor Exam:  Tone:  Within normal limits; Patient on sedation thus exam limited.  Able to move toes more on the left  Sensory: grossly intact to light touch throughout  Coordination: Deferred  Gait: Deferred  ASSESSMENT/PLAN [name] is a 75 y.o. female with history of hypertension, hyperlipidemia, diabetes with diabetic neuropathy, aortic stenosis, and a nonhealing ischemic right foot ulcer status post right lower extremity bypass graft surgery performed 02/28/2016 presenting with aphasia, right hemiplegia, and left-sided gaze preference. She did not receive IV t-PA due to recent surgery.  Stroke:  Bilateral infarcts felt to be embolic from an uncertain source.  With Left ICA disease now s/p left ICA stent  Resultant  Decreased LOC and right sided weakness  MRI - as above multiple patchy bilateral infarcts  MRA - Short-segment severe stenosis at the supraclinoid left ICA.  Severe stenosis proximal right PCA.  Carotid Doppler - refer to cerebral angiogram  2D Echo - EF 55-60%. No cardiac source of emboli identified. - See above  LDL - 53  HgbA1c 8.1  VTE prophylaxis - SCDs Diet NPO time specified  aspirin 81 mg daily prior to admission, now on aspirin 325 mg daily and clopidogrel 75 mg daily  Patient counseled to be compliant with her antithrombotic medications  Ongoing aggressive stroke risk factor management  Therapy recommendations: Pending  Disposition: Pending  Hypertension  Blood pressure run  somewhat low at times  Permissive hypertension (OK if < 220/120) but gradually normalize in 5-7 days  Long-term BP goal normotensive  Hyperlipidemia  Home meds:  Lipitor 20 mg daily resumed in hospital  LDL 53, goal < 70  Continue statin at discharge  Diabetes  HgbA1c 8.12, goal < 7.0  Uncontrolled  Other Stroke Risk Factors  Advanced age  Obesity, Body mass index is 42.19 kg/m., recommend weight loss, diet and exercise as appropriate    Other Active Problems  Anemia - worsened overnight and received I unit PRBCs  Thrombocytopenia - 143 -> 148  Leukocytosis 15.2 -> 11.3  Increased BUN and creatinine - 1.28 ; [facility] day # 4  CRITICAL CARE NEUROLOGY ATTENDING NOTE Patient was seen and examined by me personally. I independently viewed imaging studies, participated in medical decision making and plan of care. The laboratory and radiographic studies were personally reviewed  by me.  ROS pertinent positives could not be fully documented due to LOC and sedation on propofol  Assessment and plan completed by me personally and fully documented above.  PLAN FOR TODAY:  Card/Pulm:  Ordered BNP.  Today 225.6,  CCM weaning  CCM for electrolyte replacement and SSI glucose management  Mild thrombocytopenia  Leukocytosis  WBC improved today.  Will follow  Repeat CXR showed - Slightly increasing bilateral lower lung opacities -question atelectasis versus airspace disease. Question of effusions no clear infiltrate  Repeat H/H at 15:00  Iron studies reviewed: supplement ordered  TSH abnormal; ordered T3 and T4 to determine next steps  Condition is improved  This patient is critically ill and at significant risk of neurological worsening, death and care requires constant monitoring of vital signs, hemodynamics,respiratory and cardiac monitoring, extensive review of multiple databases, frequent neurological assessment, discussion with family, other specialists and  medical decision making of high complexity.  This critical care time does not reflect procedure time, or teaching time or supervisory time of PA/NP/Med Resident etc. but could involve care discussion time.  I spent 30 minutes of Neurocritical Care time in the care of  this patient.  SIGNED BY: Dr. Elissa Hefty       To contact Stroke Continuity provider, please refer to [email or web address] After hours, contact General Neurology

## 2016-03-03 NOTE — Progress Notes (Signed)
eLink Physician-Brief Progress Note Patient Name: [name] DOB: 08/03/1941 MRN: [id number]   Date of Service  03/03/2016  HPI/Events of Note  Hgb drop from 7.1 to 6.6  eICU Interventions  Plan: Transfuse 1 unit pRBC Post-transfusion CBC     Intervention Category Intermediate Interventions: Other:  Saamir Armstrong 03/03/2016, 5:55 AM

## 2016-03-03 NOTE — Progress Notes (Signed)
[facility]  Subjective  - sedated on vent, did move right side some per nurse   Objective (!) 128/37 (!) 57 98 F (36.7 C) (Axillary) 11 100%  Intake/Output Summary (Last 24 hours) at 03/03/16 0912 Last data filed at 03/03/16 0830  Gross per 24 hour  Intake          2561.88 ml  Output             1170 ml  Net          1391.88 ml   Right foot wound pale granulation poorly perfused, foot warm  Assessment/Planning: Acute blood loss anemia: hemodynamically stable doubt ongoing bleeding, follow up post transfusion hemoglobin Right foot with pale wound suggesting poor perfusion most likely graft occluded but not a candidate for further intervention currently Periop CVA per neurology/neuro IR   Ruta Hinds 03/03/2016 9:12 AM --  Laboratory Lab Results:  Recent Labs  03/02/16 0600 03/03/16 0512  WBC 15.2* 11.3*  HGB 7.1* 6.6*  HCT 22.7* 21.1*  PLT 143* 148*   BMET  Recent Labs  03/02/16 0600 03/03/16 0512  NA 138 140  140  K 4.6 4.2  4.2  CL 109 112*  112*  CO2 23 23  23   GLUCOSE 178* 276*  277*  BUN 22* 36*  36*  CREATININE 0.96 1.28*  1.25*  CALCIUM 8.1* 8.2*  8.2*    COAG Lab Results  Component Value Date   INR 1.20 02/26/2016   INR 1.11 02/12/2014   INR 1.08 02/04/2014   No results found for: PTT

## 2016-03-04 ENCOUNTER — Inpatient Hospital Stay (HOSPITAL_COMMUNITY): Payer: Medicare HMO

## 2016-03-04 ENCOUNTER — Encounter (HOSPITAL_COMMUNITY): Payer: Self-pay | Admitting: Interventional Radiology

## 2016-03-04 DIAGNOSIS — Z419 Encounter for procedure for purposes other than remedying health state, unspecified: Secondary | ICD-10-CM

## 2016-03-04 LAB — TYPE AND SCREEN
ABO/RH(D): O POS
ANTIBODY SCREEN: NEGATIVE
UNIT DIVISION: 0
UNIT DIVISION: 0

## 2016-03-04 LAB — COMPREHENSIVE METABOLIC PANEL
ALBUMIN: 2.1 g/dL — AB (ref 3.5–5.0)
ALT: 22 U/L (ref 14–54)
AST: 17 U/L (ref 15–41)
Alkaline Phosphatase: 49 U/L (ref 38–126)
Anion gap: 5 (ref 5–15)
BUN: 45 mg/dL — AB (ref 6–20)
CHLORIDE: 113 mmol/L — AB (ref 101–111)
CO2: 26 mmol/L (ref 22–32)
CREATININE: 1.09 mg/dL — AB (ref 0.44–1.00)
Calcium: 8.3 mg/dL — ABNORMAL LOW (ref 8.9–10.3)
GFR calc Af Amer: 57 mL/min — ABNORMAL LOW (ref >60)
GFR, EST NON AFRICAN AMERICAN: 49 mL/min — AB (ref >60)
Glucose, Bld: 283 mg/dL — ABNORMAL HIGH (ref 65–99)
POTASSIUM: 3.9 mmol/L (ref 3.5–5.1)
SODIUM: 144 mmol/L (ref 135–145)
Total Bilirubin: 0.6 mg/dL (ref 0.3–1.2)
Total Protein: 4.8 g/dL — ABNORMAL LOW (ref 6.5–8.1)

## 2016-03-04 LAB — HEPATIC FUNCTION PANEL
ALBUMIN: 2.2 g/dL — AB (ref 3.5–5.0)
ALT: 26 U/L (ref 14–54)
AST: 19 U/L (ref 15–41)
Alkaline Phosphatase: 55 U/L (ref 38–126)
BILIRUBIN TOTAL: 0.5 mg/dL (ref 0.3–1.2)
Total Protein: 5 g/dL — ABNORMAL LOW (ref 6.5–8.1)

## 2016-03-04 LAB — TRIGLYCERIDES: TRIGLYCERIDES: 118 mg/dL (ref <150)

## 2016-03-04 LAB — POCT ACTIVATED CLOTTING TIME
ACTIVATED CLOTTING TIME: 186 s
Activated Clotting Time: 175 seconds

## 2016-03-04 LAB — GLUCOSE, CAPILLARY
GLUCOSE-CAPILLARY: 278 mg/dL — AB (ref 65–99)
GLUCOSE-CAPILLARY: 254 mg/dL — AB (ref 65–99)
GLUCOSE-CAPILLARY: 217 mg/dL — AB (ref 65–99)
GLUCOSE-CAPILLARY: 248 mg/dL — AB (ref 65–99)
Glucose-Capillary: 295 mg/dL — ABNORMAL HIGH (ref 65–99)

## 2016-03-04 LAB — RETICULOCYTES
RBC.: 2.78 MIL/uL — ABNORMAL LOW (ref 3.87–5.11)
Retic Count, Absolute: 125.1 10*3/uL (ref 19.0–186.0)
Retic Ct Pct: 4.5 % — ABNORMAL HIGH (ref 0.4–3.1)

## 2016-03-04 LAB — CBC WITH DIFFERENTIAL/PLATELET
BASOS ABS: 0 10*3/uL (ref 0.0–0.1)
Basophils Relative: 0 %
EOS ABS: 0.1 10*3/uL (ref 0.0–0.7)
EOS PCT: 1 %
HCT: 24.5 % — ABNORMAL LOW (ref 36.0–46.0)
Hemoglobin: 7.8 g/dL — ABNORMAL LOW (ref 12.0–15.0)
Lymphocytes Relative: 12 %
Lymphs Abs: 1.2 10*3/uL (ref 0.7–4.0)
MCH: 29.1 pg (ref 26.0–34.0)
MCHC: 31.8 g/dL (ref 30.0–36.0)
MCV: 91.4 fL (ref 78.0–100.0)
Monocytes Absolute: 1.3 10*3/uL — ABNORMAL HIGH (ref 0.1–1.0)
Monocytes Relative: 12 %
Neutro Abs: 7.8 10*3/uL — ABNORMAL HIGH (ref 1.7–7.7)
Neutrophils Relative %: 75 %
PLATELETS: 147 10*3/uL — AB (ref 150–400)
RBC: 2.68 MIL/uL — AB (ref 3.87–5.11)
RDW: 16.7 % — ABNORMAL HIGH (ref 11.5–15.5)
WBC: 10.4 10*3/uL (ref 4.0–10.5)

## 2016-03-04 LAB — BASIC METABOLIC PANEL
Anion gap: 9 (ref 5–15)
BUN: 45 mg/dL — AB (ref 6–20)
CHLORIDE: 112 mmol/L — AB (ref 101–111)
CO2: 27 mmol/L (ref 22–32)
Calcium: 8.3 mg/dL — ABNORMAL LOW (ref 8.9–10.3)
Creatinine, Ser: 0.94 mg/dL (ref 0.44–1.00)
GFR calc non Af Amer: 59 mL/min — ABNORMAL LOW (ref >60)
Glucose, Bld: 221 mg/dL — ABNORMAL HIGH (ref 65–99)
POTASSIUM: 3.7 mmol/L (ref 3.5–5.1)
SODIUM: 148 mmol/L — AB (ref 135–145)

## 2016-03-04 LAB — POCT I-STAT 3, ART BLOOD GAS (G3+)
Acid-Base Excess: 5 mmol/L — ABNORMAL HIGH (ref 0.0–2.0)
Bicarbonate: 27.6 mEq/L — ABNORMAL HIGH (ref 20.0–24.0)
O2 SAT: 99 %
PCO2 ART: 32.3 mmHg — AB (ref 35.0–45.0)
PH ART: 7.539 — AB (ref 7.350–7.450)
PO2 ART: 142 mmHg — AB (ref 80.0–100.0)
Patient temperature: 98.6
TCO2: 29 mmol/L (ref 0–100)

## 2016-03-04 LAB — CBC
HEMATOCRIT: 26.1 % — AB (ref 36.0–46.0)
Hemoglobin: 8.1 g/dL — ABNORMAL LOW (ref 12.0–15.0)
MCH: 28.2 pg (ref 26.0–34.0)
MCHC: 31 g/dL (ref 30.0–36.0)
MCV: 90.9 fL (ref 78.0–100.0)
PLATELETS: 172 10*3/uL (ref 150–400)
RBC: 2.87 MIL/uL — AB (ref 3.87–5.11)
RDW: 16.6 % — ABNORMAL HIGH (ref 11.5–15.5)
WBC: 13.5 10*3/uL — AB (ref 4.0–10.5)

## 2016-03-04 LAB — HEMOGLOBIN A1C
Hgb A1c MFr Bld: 7.7 % — ABNORMAL HIGH (ref 4.8–5.6)
MEAN PLASMA GLUCOSE: 174 mg/dL

## 2016-03-04 LAB — LACTATE DEHYDROGENASE: LDH: 136 U/L (ref 98–192)

## 2016-03-04 LAB — OCCULT BLOOD X 1 CARD TO LAB, STOOL: FECAL OCCULT BLD: NEGATIVE

## 2016-03-04 LAB — PHOSPHORUS: Phosphorus: 2.6 mg/dL (ref 2.5–4.6)

## 2016-03-04 LAB — TROPONIN I: Troponin I: 0.1 ng/mL (ref <0.03)

## 2016-03-04 LAB — MAGNESIUM: MAGNESIUM: 2.2 mg/dL (ref 1.7–2.4)

## 2016-03-04 LAB — T4, FREE: FREE T4: 1.2 ng/dL — AB (ref 0.61–1.12)

## 2016-03-04 MED ORDER — FUROSEMIDE 10 MG/ML IJ SOLN
60.0000 mg | Freq: Two times a day (BID) | INTRAMUSCULAR | Status: DC
  Administered 2016-03-04 – 2016-03-05 (×4): 60 mg via INTRAVENOUS
  Filled 2016-03-04 (×4): qty 6

## 2016-03-04 MED ORDER — INSULIN DETEMIR 100 UNIT/ML ~~LOC~~ SOLN
20.0000 [IU] | Freq: Two times a day (BID) | SUBCUTANEOUS | Status: DC
  Administered 2016-03-04 – 2016-03-05 (×2): 20 [IU] via SUBCUTANEOUS
  Filled 2016-03-04 (×4): qty 0.2

## 2016-03-04 NOTE — Care Management Important Message (Signed)
Important Message  Patient Details  Name: [name] MRN: [id number] Date of Birth: 13-Mar-1941   Medicare Important Message Given:  Yes    Loann Quill 03/04/2016, 3:25 PM

## 2016-03-04 NOTE — Progress Notes (Signed)
   Daily Progress Note  Assessment/Planning: POD #5 s/p R pop to peroneal bypass with ips rGSV, POD #3 s/p L ICA stenting by NeuroIR, B CVA, Ansarca, severe protein malnutrition, severe supraclinoid L ICA stenosis   Appreciate PCCM and Neurology and NeuroIR's help with this patient  Neuro: sedated to facilitate vent, defer to Neuro/PCCM decision on extubation, re-examine once extubated, maintain BP to support cerebral perfusion  Pulm: on pCXR suggestive of pleural effusions, wonder if some degree of heart failure has occurred given development ansarca; echo pending; effusions may make extubation more difficult, additional diuresis will have to be balanced against cerebral perfusion   CV: echo pending, concern for development of some CHF  GI: on tube feeds, no evidence of RPH on CT  FEN: no hyperkalemia currently  REN: scheduled Lasix   HEME: likely hemodilution  VASC: improved signals this AM, no point in get bypass duplex as no further revascularization options at this point, continue Santyl and wet-to-dry dressing to Left foot   Subjective  - 3 Days Post-Op  Still intubated, spontaneously moving left side, some tracking with eyes  Objective Vitals:   03/04/16 0600 03/04/16 0800 03/04/16 0904 03/04/16 0920  BP: (!) 117/56 (!) 135/47 (!) 143/118 (!) 136/44  Pulse: 61 64 67 62  Resp: 18 19  15   Temp:  98.5 F (36.9 C)    TempSrc:  Axillary    SpO2: 100% 100% 100% 100%  Weight:      Height:        Intake/Output Summary (Last 24 hours) at 03/04/16 1016 Last data filed at 03/04/16 0800  Gross per 24 hour  Intake           2657.3 ml  Output             1950 ml  Net            707.3 ml   NEURO sedated, some purposeful L sided motor PULM  BLL rales, intubated CV  RRR GI  soft, NTND, TF running VASC  L 1st MT wound cleaning up with granulation underlying, +dopplerable peroneal (improved from Friday) REN    Net +1.5 L/last 24, total: +5.8 L  Laboratory CBC      Component Value Date/Time   WBC 10.4 03/04/2016 0550   HGB 7.8 (L) 03/04/2016 0550   HCT 24.5 (L) 03/04/2016 0550   PLT 147 (L) 03/04/2016 0550    BMET    Component Value Date/Time   NA 144 03/04/2016 0550   K 3.9 03/04/2016 0550   CL 113 (H) 03/04/2016 0550   CO2 26 03/04/2016 0550   GLUCOSE 283 (H) 03/04/2016 0550   BUN 45 (H) 03/04/2016 0550   CREATININE 1.09 (H) 03/04/2016 0550   CALCIUM 8.3 (L) 03/04/2016 0550   GFRNONAA 49 (L) 03/04/2016 0550   GFRAA 57 (L) 03/04/2016 0550    Adele Barthel, MD [facility]: [phone] Pager: [phone]  03/04/2016, 10:16 AM

## 2016-03-04 NOTE — Progress Notes (Signed)
Inpatient Diabetes Program Recommendations  AACE/ADA: New Consensus Statement on Inpatient Glycemic Control (2015)  Target Ranges:  Prepandial:   less than 140 mg/dL      Peak postprandial:   less than 180 mg/dL (1-2 hours)      Critically ill patients:  140 - 180 mg/dL   Results for YU, MCNISH (MRN [id number]) as of 03/04/2016 13:04  Ref. Range 03/03/2016 07:17 03/03/2016 11:33 03/03/2016 15:17 03/03/2016 19:13 03/03/2016 23:02 03/04/2016 03:13 03/04/2016 07:48 03/04/2016 12:36  Glucose-Capillary Latest Ref Range: 65 - 99 mg/dL 304 (H) 316 (H) 279 (H) 225 (H) 270 (H) 278 (H) 295 (H) 254 (H)   Review of Glycemic Control  Current orders for Inpatient glycemic control: Levemir 20 units Q12H, Novolog 0-15 units Q4H  Inpatient Diabetes Program Recommendations: Insulin - Basal: Note Lantus was increased from 15 units Q12H to 20 units Q12H today.  Correction (SSI): Please discontinue current Novolog correction and use the ICU Glycemic Control order set to improve glycemic control.  Insulin - Tube Feeding Coverage: Please consider ordering Novolog 4 units Q4H for tube feeding coverage. If tube feeding is held or stopped then tube feeding coverage would also be held or stopped.  Thanks, Barnie Alderman, RN, MSN, CDE Diabetes Coordinator Inpatient Diabetes Program [phone] (Team Pager from [facility] to [place]) [phone] ([facility] office) [phone] [facility] office) [phone] [facility] office)

## 2016-03-04 NOTE — Progress Notes (Signed)
eLink Physician-Brief Progress Note Patient Name: [name] DOB: Jan 06, 1941 MRN: [id number]   Date of Service  03/04/2016  HPI/Events of Note  Troponin positive, patient unable to express chest pain or not Case reviewed, multiple medical problems, severe peripheral vascular disease complicated by stroke.  Now on vent.    eICU Interventions  EKG Continue plavix Hold anticoagulation     Intervention Category Intermediate Interventions: Diagnostic test evaluation  Simonne Maffucci 03/04/2016, 8:04 PM

## 2016-03-04 NOTE — Progress Notes (Signed)
STROKE TEAM PROGRESS NOTE   SUBJECTIVE (INTERVAL HISTORY) Her family is at the bedside. She remains intubated. Etiology of stroke not yet clear. She had long standing non-healing right foot wound since fall two years ago. She at baseline wheelchair bound. Concerning for DVT with PFO causing left ICA clot.    OBJECTIVE Temp:  [97.5 F (36.4 C)-98.5 F (36.9 C)] 98.5 F (36.9 C) (07/31 0800) Pulse Rate:  [47-75] 62 (07/31 0920) Cardiac Rhythm: Normal sinus rhythm (07/31 0800) Resp:  [15-32] 15 (07/31 0920) BP: (97-143)/(28-118) 136/44 (07/31 0920) SpO2:  [100 %] 100 % (07/31 0920) FiO2 (%):  [30 %] 30 % (07/31 0904) Weight:  [117.1 kg (258 lb 2.5 oz)] 117.1 kg (258 lb 2.5 oz) (07/31 0222)  CBC:   Recent Labs Lab 03/03/16 0512 03/03/16 1230 03/04/16 0550  WBC 11.3* 12.3* 10.4  NEUTROABS 9.2*  --  7.8*  HGB 6.6* 8.2* 7.8*  HCT 21.1* 26.0* 24.5*  MCV 88.7 90.3 91.4  PLT 148* 153 147*    Basic Metabolic Panel:   Recent Labs Lab 03/03/16 0512 03/04/16 0550  NA 140  140 144  K 4.2  4.2 3.9  CL 112*  112* 113*  CO2 23  23 26   GLUCOSE 276*  277* 283*  BUN 36*  36* 45*  CREATININE 1.28*  1.25* 1.09*  CALCIUM 8.2*  8.2* 8.3*  MG 2.2 2.2  PHOS 3.4  3.4 2.6    Lipid Panel:     Component Value Date/Time   CHOL 109 03/02/2016 0600   TRIG 118 03/04/2016 0550   HDL 32 (L) 03/02/2016 0600   CHOLHDL 3.4 03/02/2016 0600   VLDL 24 03/02/2016 0600   LDLCALC 53 03/02/2016 0600   HgbA1c:  Lab Results  Component Value Date   HGBA1C 7.7 (H) 03/02/2016   Urine Drug Screen:     Component Value Date/Time   LABOPIA NONE DETECTED 02/12/2014 0110   COCAINSCRNUR NONE DETECTED 02/12/2014 0110   LABBENZ NONE DETECTED 02/12/2014 0110   AMPHETMU NONE DETECTED 02/12/2014 0110   THCU NONE DETECTED 02/12/2014 0110   LABBARB NONE DETECTED 02/12/2014 0110      IMAGING I have personally reviewed the radiological images below and agree with the radiology  interpretations.  Ct Head Code Stroke Wo Contrast 03/01/2016 Age related volume loss with mild periventricular small vessel disease. No acute infarct evident currently. No edema or hemorrhage. No hyperdense vessel evident. Basal ganglia calcification is physiologic.   Ct Angio Head and Neck W Or Wo Contrast 03/01/2016 Near occlusion of the left ICA at the origin. Reduced caliber of the left cervical internal carotid artery because of reduced inflow. 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 Stenoses estimated at 50% bilaterally. Probable serial stenoses. No M1 stenosis and no missing M2 branches on the left.    Ct Cerebral Perfusion W Contrast 03/01/2016 At risk brain in the left MCA territory showing normal cerebral blood volume but decreased cerebral blood flow, mean transit time and time to peak. No evidence of completed infarction.   Cerebral Angiogram 03/01/2016 S./P bilateral  Common  Carotid arteriogram followed by by endovascular revascularization of  preocclusive lt ICA stenosis prox with a thrombus with stent assisted angioplasty and 4.5 mg of IA Integrelin TICI flow MCA distribution  Ct Head Wo Contrast 03/01/2016 No change or acute finding following left carotid artery stent.   MRI HEAD  03/02/2016 1. Acute patchy ischemic infarcts involving left basal ganglia and internal  capsule, with extension into periventricular white matter of the left corona radiata/centrum semi ovale. No associated hemorrhage or mass effect.  2. Additional scattered small punctate cortical and white matter infarcts involving the bilateral cerebral hemispheres and right cerebellum as above. No associated hemorrhage or mass effect.  3. 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 No associated edema.   MRA HEAD 03/02/2016 1. No large vessel occlusion within the intracranial circulation.  2.  Short-segment severe stenosis at the supraclinoid left ICA.  3. Severe stenosis proximal right PCA with attenuated flow distally.  4. Additional multi focal atheromatous irregularity involving the anterior posterior circulations as above without severe or correctable stenosis.  5. Hypoplastic/absent left A1 segment, likely accounting for the diminutive left ICA is compared to the right.   Transthoracic echocardiogram 03/02/2016 - Left ventricle: The cavity size was normal. There was moderate concentric hypertrophy. Systolic function was normal. The estimated ejection fraction was in the range of 55% to 60%. Although no diagnostic regional wall motion abnormality was identified, this possibility cannot be completely excluded on the basis of this study. Features are consistent with a pseudonormal left ventricular filling pattern, with concomitant abnormal relaxation and increased filling pressure (grade 2 diastolic dysfunction). - Aortic valve: There was mild stenosis. Valve area (VTI): 1.28 cm^2. Valve area (Vmax): 1.18 cm^2. Valve area (Vmean): 1.04 cm^2. - Left atrium: The atrium was moderately dilated. - Pericardium, extracardiac: A small pericardial effusion was identified circumferential to the heart. Impressions: - There is an echo free space posterior the left ventricle that most likely represents a left pleural effusion, but image quality is poor. Cannot entirely exclude a loculated pericardial efusion. Correlate with radiological studies.  LE venous doppler - pending   PHYSICAL EXAM  Temp:  [97.7 F (36.5 C)-98.5 F (36.9 C)] 97.8 F (36.6 C) (07/31 1700) Pulse Rate:  [47-78] 66 (07/31 1800) Resp:  [15-20] 15 (07/31 1800) BP: (97-154)/(33-128) 154/35 (07/31 1800) SpO2:  [100 %] 100 % (07/31 1800) FiO2 (%):  [30 %] 30 % (07/31 1800) Weight:  [258 lb 2.5 oz (117.1 kg)] 258 lb 2.5 oz (117.1 kg) (07/31 0222)  General - Well nourished, well developed, intubated on low dose  sedation.  Ophthalmologic - Fundi not visualized due to noncooperation.  Cardiovascular - Regular rate and rhythm.  Extremities - right foot proximal to big toe wound with dressing  Neuro - intubated on low dose sedation, able to slightly open eyes on voice. Able to follow some central commands such as closing eyes, open eyes, open mouth and one peripheral command with left thumb but no other commands. PERRL, mid position, able to move to the left but not on the right. Positive corneal and gag. On pain stimulation, LUE purposeful movement, RUE 2/5, BLE trace withdraw. DTR 1+ and no babinski. Sensation, coordination and gait not tested.   ASSESSMENT/PLAN Ms. AZAYAH GENS is a 75 y.o. female with history of hypertension, hyperlipidemia, diabetes with diabetic neuropathy, aortic stenosis, and a nonhealing ischemic right foot ulcer status post right lower extremity bypass graft surgery performed 02/28/2016 who developed aphasia, right hemiplegia, and left-sided gaze preference in hopsital. She did not receive IV t-PA due to recent surgery. CT angio showed a near occlusion of the L ICA , perfusion showed brain at risk. She was taken to IR where she received stent assisted angioplasty and IA Integrelin; a L ICA stent was placed.  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  Severe stenosis proximal right PCA.  CTA head and neck - near occlusion L ICA, R ICA 50%, serial stenosis  2D Echo - EF 55-60%. No cardiac source of emboli identified.   LE venous doppler pending   LDL - 53  HgbA1c 8.1  VTE prophylaxis - SCDs Diet NPO time specified  aspirin 81  mg daily prior to admission, now on aspirin 325 mg daily and clopidogrel 75 mg daily.    Patient counseled to be compliant with her antithrombotic medications  Ongoing aggressive stroke risk factor management  Therapy recommendations: Pending  Disposition: Pending  Acute Hypoxic Respiratory Failure  Lethargy at stroke onset  Intubated prior to procedure  Weaning today  CCM managing  Anemia   received 2 unit PRBCs 7/27, 1u 7/28, 1u 7/30.   Hb 6.6->8.2->7.8->8.1  Close monitoring  Continue DAPT for left ICA stent  CT abd - no source of bleeding  Fecal occult blood - negative  Hypertension  Blood pressure run somewhat low at times  Permissive hypertension (OK if < 220/120) but gradually normalize in 5-7 days  Long-term BP goal normotensive  Hyperlipidemia  Home meds:  Lipitor 20 mg daily resumed in hospital  LDL 53, goal < 70  Continue statin at discharge  Diabetes  HgbA1c 8.1, goal < 7.0  Uncontrolled  SSI  Other Stroke Risk Factors  Advanced age  Morbid Obesity, Body mass index is 42.96 kg/m., recommend weight loss, diet and exercise as appropriate   PVD - RLE popliteal artery to peroneal artery bypass 02/28/2016  Thyroid disease  TSH 0.151  T4 1.2  T3 pending   Other Active Problems  Thrombocytopenia - 143 -> 147  Leukocytosis 15.2 -> 10.4->13.5  Increased BUN and creatinine - 1.28 -> 1.09  Right foot wound - VVS on board  Hospital day # 5  This patient is critically ill due to bilateral embolic stroke, left ICA stenosis s/p stent, VDRF, anemia and at significant risk of neurological worsening, death form recurrent stroke, hemorrhagic transformation, in-stent restenosis, shock. This patient's care requires constant monitoring of vital signs, hemodynamics, respiratory and cardiac monitoring, review of multiple databases, neurological assessment, discussion with family, other specialists and medical decision making of high complexity.  I spent 45 minutes of neurocritical care time in the care of this patient.  Rosalin Hawking, MD PhD Stroke Neurology 03/04/2016 7:21 PM     To contact Stroke Continuity provider, please refer to [email or web address] After hours, contact General Neurology

## 2016-03-04 NOTE — Progress Notes (Addendum)
PT Cancellation Note/Discharge:  Patient Details Name: [name] MRN: [id number] DOB: 1941-06-17   Cancelled Treatment:    Reason Eval/Treat Not Completed: Medical issues which prohibited therapy;Patient not medically ready.  Pt with significant medical change since last PT visit (new stroke, intubated).  Please re-order PT when pt is medically ready to mobilize.  PT to sign off.   Thanks,    Barbarann Ehlers. [place], [place], DPT [phone]   03/04/2016, 11:14 PM

## 2016-03-04 NOTE — Progress Notes (Signed)
OT Cancellation Note  Patient Details Name: [name] MRN: [id number] DOB: 10/21/1940   Cancelled Treatment:    Reason Eval/Treat Not Completed: Patient not medically ready Pt currently intubated and pending possible extubation. OT to hold at this time and check back at later time  Vonita Moss   OTR/L Pager: [phone] Office: [phone] .  03/04/2016, 8:09 AM

## 2016-03-04 NOTE — Progress Notes (Signed)
CRITICAL VALUE ALERT  Critical value received:  Troponin 0.1  Date of notification:  03/04/2016   Time of notification:  0710  Critical value read back:Yes.    Nurse who received alert:  Beacher May, RN  MD notified (1st page):  Dr. Lake Bells  Time of first page:  2000  MD notified (2nd page):  Time of second page:  Responding MD:  Dr. Lake Bells  Time MD responded:  2000  MD aware. 12-lead EKG ordered.

## 2016-03-04 NOTE — Progress Notes (Signed)
[facility] Progress Note Patient Name: [name] DOB: 1940-09-19 MRN: [id number]   Date of Service  03/04/2016  HPI/Events of Note  12 lead reviewed T Wave inversion lateral limb and precordial leads, has been present on many prior EKG's  eICU Interventions  No change in management     Intervention Category Intermediate Interventions: Diagnostic test evaluation  Simonne Maffucci 03/04/2016, 9:51 PM1

## 2016-03-04 NOTE — Progress Notes (Signed)
PULMONARY / CRITICAL CARE MEDICINE   Name: [name] MRN: [id number] DOB: [birth year]    ADMISSION DATE:  02/28/2016 CONSULTATION DATE:  03/01/16  REFERRING MD:  Dr. Leonie Man / Stroke MD   CHIEF COMPLAINT:  CVA, Acute Respiratory Failure   HISTORY OF PRESENT ILLNESS:  75 y/o M with PMH of DM, diabetic neuropathy, GERD, HLD, HTN, aortic stenosis who was admitted on 7/26 for right popliteal bypass graft in the setting of chronic right lower extremity wound.  Angiography in 12/2015 revealed bilateral extensive tibial disease.  The patient was initially recommended amputation however, she refused.  On 7/26 she underwent a right below the knee popliteal artery to peroneal artery bypass. Postoperatively she was treated with heparin and transitioned to Plavix on 7/27. JP drain postoperatively had high output. She was evaluated by physical therapy and recommended for skilled nursing facility placement. Postoperatively there were concern for weak doppler flow. On 7/28, she was noted to have worsening of peroneal signal consistent with early failure and anemia requiring transfusion of 2 units PRBCs. Palliative amputation was recommended.  On re-rounding, the patient was noted to have altered mental status with right-sided weakness and left gaze. Code Stroke called for further evaluation.  Upon*team evaluation, she was noted to have incomprehensible speech and moaning as well as right hemiplegia. She was premedicated with Solu-Medrol and Benadryl for imaging given her history of iodine allergy. Imaging delayed due to difficulty obtaining IV access. She was emergently taken to neuro-interventional radiology where she underwent procedure for revascularization (stent assisted angioplasty) of her left ICA in the setting of thrombus.  The patient was intubated and returned to ICU for further observation.  PCCM consulted for ICU assistance  SUBJECTIVE: CT done neg bleed  VITAL SIGNS: BP (!) 136/44   Pulse 62    Temp 98.5 F (36.9 C) (Axillary)   Resp 15   Ht 5\' 5"  (1.651 m)   Wt 117.1 kg (258 lb 2.5 oz)   SpO2 100%   BMI 42.96 kg/m   HEMODYNAMICS:    VENTILATOR SETTINGS: Vent Mode: PSV;CPAP FiO2 (%):  [30 %] 30 % Set Rate:  [18 bmp] 18 bmp Vt Set:  [450 mL] 450 mL PEEP:  [5 cmH20] 5 cmH20 Pressure Support:  [10 cmH20] 10 cmH20 Plateau Pressure:  [13 cmH20-24 cmH20] 13 cmH20  INTAKE / OUTPUT: I/O last 3 completed shifts: In: 4896.2 [I.V.:2735.1; Blood:360; NG/GT:1701.1; IV Piggyback:100] Out: 2560 [Urine:2560]  PHYSICAL EXAMINATION:  General: on vent Neuro:FC, moves all ext, rt weaker HEENT: jvd, ett PULM: cta ant CV: s1 s2 RRR, int self resolving brady GI: soft, obese, NT  n d Extremities: edema, warm, pulses present   LABS:  BMET  Recent Labs Lab 03/02/16 0600 03/03/16 0512 03/04/16 0550  NA 138 140  140 144  K 4.6 4.2  4.2 3.9  CL 109 112*  112* 113*  CO2 23 23  23 26   BUN 22* 36*  36* 45*  CREATININE 0.96 1.28*  1.25* 1.09*  GLUCOSE 178* 276*  277* 283*    Electrolytes  Recent Labs Lab 03/02/16 0600 03/02/16 1759 03/03/16 0512 03/04/16 0550  CALCIUM 8.1*  --  8.2*  8.2* 8.3*  MG 2.0 2.1 2.2 2.2  PHOS 3.3 3.4 3.4  3.4 2.6    CBC  Recent Labs Lab 03/03/16 0512 03/03/16 1230 03/04/16 0550  WBC 11.3* 12.3* 10.4  HGB 6.6* 8.2* 7.8*  HCT 21.1* 26.0* 24.5*  PLT 148* 153 147*    Coag's  Recent Labs Lab 02/26/16 1104  APTT 31  INR 1.20    Sepsis Markers  Recent Labs Lab 03/01/16 2030 03/01/16 2042 03/01/16 2350 03/02/16 0600 03/03/16 0512  LATICACIDVEN  --  1.9 1.9  --   --   PROCALCITON 0.32  --   --  0.41 0.28    ABG  Recent Labs Lab 03/01/16 1623  PHART 7.379  PCO2ART 42.6  PO2ART 268.0*    Liver Enzymes  Recent Labs Lab 02/26/16 1104 03/03/16 0512 03/04/16 0550  AST 28  --  17  ALT 38  --  22  ALKPHOS 61  --  49  BILITOT 0.5  --  0.6  ALBUMIN 3.5 2.1* 2.1*    Cardiac Enzymes No results for  input(s): TROPONINI, PROBNP in the last 168 hours.  Glucose  Recent Labs Lab 03/03/16 1133 03/03/16 1517 03/03/16 1913 03/03/16 2302 03/04/16 0313 03/04/16 0748  GLUCAP 316* 279* 225* 270* 278* 295*    Imaging Ct Abdomen Pelvis Wo Contrast  Result Date: 03/03/2016 CLINICAL DATA:  Drop in hemoglobin. Source? Recent IR procedure for code stroke. EXAM: CT ABDOMEN AND PELVIS WITHOUT CONTRAST TECHNIQUE: Multidetector CT imaging of the abdomen and pelvis was performed following the standard protocol without IV contrast. COMPARISON:  None. FINDINGS: Lower chest: Bibasilar pleural effusions, incompletely imaged, small to moderate in size, with adjacent atelectasis. Hepatobiliary: Single 8 mm stone within the gallbladder. Gallbladder walls appear thickened. Liver is unremarkable. Pancreas: Diffusely infiltrated with fat but otherwise unremarkable. Spleen: Benign cyst exophytic to the posterior-medial cortex of the spleen measures 1.6 cm. Otherwise unremarkable. Adrenals/Urinary Tract: Adrenal gland slightly bulbous in configuration but without discrete mass. Benign cyst exophytic to the medial cortex of the left kidney. Kidneys otherwise unremarkable without stone or hydronephrosis. No ureteral or bladder calculi identified. Bladder decompressed by Foley catheter. Stomach/Bowel: Bowel is normal in caliber. Extensive diverticulosis throughout the descending and sigmoid colon without evidence of acute diverticulitis. Additional mild diverticulosis within the right colon. Appendix is normal. Stomach decompressed by enteric tube. Enteric tube tip within the proximal duodenum. Vascular/Lymphatic: Scattered atherosclerotic changes of the normal caliber abdominal aorta. No retroperitoneal hemorrhage. Reproductive: Calcified fibroid.  Otherwise unremarkable. Other: Mild fluid stranding within the lower mesentery and adjacent to the psoas muscles. No significant free fluid. No intraperitoneal or retroperitoneal  hemorrhage. No free intraperitoneal air. Musculoskeletal: Degenerative changes throughout the slightly scoliotic thoracolumbar spine. No acute or suspicious osseous finding. Mild fluid/edema within the subcutaneous soft tissues indicating anasarca. Superficial soft tissues otherwise unremarkable. IMPRESSION: 1. No source for a drop in hemoglobin identified. No intraperitoneal or retroperitoneal hemorrhage. 2. Cholelithiasis. Gallbladder walls appear thickened. Gallbladder wall thickening is a nonspecific finding with differential considerations that include cholecystitis, hypoproteinemia, CHF and reactive thickening secondary to adjacent liver disease. If clinical symptoms are concerning for an acute cholecystitis, would consider gallbladder ultrasound and/or nuclear medicine HIDA scan for further characterization. 3. Bibasilar pleural effusions, incompletely imaged, small to moderate in size. 4. Colonic diverticulosis without evidence of acute diverticulitis. 5. Mild anasarca. 6. Aortic atherosclerosis. 7. Additional chronic/incidental findings detailed above. These results will be called to the ordering clinician or representative by the Radiologist Assistant, and communication documented in the PACS or zVision Dashboard. Electronically Signed   By: Franki Cabot M.D.   On: 03/03/2016 17:20  Dg Chest Port 1 View  Result Date: 03/04/2016 CLINICAL DATA:  Check endotracheal tube placement, pulmonary congestion EXAM: PORTABLE CHEST 1 VIEW COMPARISON:  03/03/2016 FINDINGS: Cardiac shadow remains enlarged. Endotracheal tube and  nasogastric catheter are noted in satisfactory position. Bibasilar atelectatic changes are noted left greater than right. No new focal infiltrate is seen. Mild central vascular congestion is again noted. IMPRESSION: No significant interval change from the prior study. Electronically Signed   By: Inez Catalina M.D.   On: 03/04/2016 07:41    STUDIES:  CTA Head / Neck 7/28: At risk brain in  the L MCA territory showing normal cerebral blood volume but decreased cerebral flow CT Cerebral Perfusion 7/28: near occlusion of the L ICA at the origin Abd X-ray 7/28: Enteric catheter with tip overlying the peripyloric region. Port CXR 7/28: Endotracheal tube in good position. Enteric feeding tube coursing below diaphragm. Right internal jugular central venous catheter in place. Low lung volumes. Bilateral basilar opacities. MRI/MRA of Brain 7/29: Acute patchy ischemic infarcts left basal ganglia & internal capsule with extension into periventricular white matter of left corona radiata/centrum semi-ovale. No hemorrhage or mass effect. Scattered small punctate cortical & white matter infarcts involving bilateral cerebral hemispheres & rites or bowel movement. No associated mass or hemorrhage. 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 No large vessel occlusion within intracranial circulation. Short segment severe stenosis left ICA. Severe stenosis proximal right PCA. Multifocal atheromatous irregularity involving anterior & posterior circulations without severe or correctable stenosis. TTE 7/29: Moderate concentric LVH. EF 55-60%. Grade 2 diastolic dysfunction. Cannot exclude wall motion abnormality. LA moderately dilated & RA normal in size. RV normal in size and function. Mild aortic regurgitation without stenosis. No mitral regurgitation or stenosis. No pulmonic stenosis. No tricuspid regurgitation. Small circumferential pericardial effusion.  Port CXR 7/30: Enteric feeding tube coursing below diaphragm. Endotracheal tube in good position. Bilateral basilar opacities unchanged. Right internal jugular central venous catheter unchanged in position. 7/30 CT abdo - no bleed  MICROBIOLOGY: MRSA PCR 7/24:  Negative Tracheal Asp Ctx 7/29>>>  ANTIBIOTICS: None  SIGNIFICANT EVENTS: 7/26 - Admit for R popliteal artery to peroneal artery bypass 7/28 - Code Stroke called for R sided  weakness, to neuro IR with near occlusion of L ICA s/p revascularization     LINES/TUBES: OETT 7/28 >>  R IJ Dual Lumen 7/28 >>  Foley 7/26 >> L Fem Sheat 7/28 - 7/29 L Fem Art Line 7/28 - 7/29 R UE Midline IV 7/28 >>  ASSESSMENT / PLAN:  NEUROLOGIC A:   L ICA CVA - S/P Revascularization 7/28 Sedation on Ventilator  P:   RASS goal: 0 Propofol gtt Fentanyl gtt & IV prn Pain Management per Neuro IR/Stroke Service ASA 325mg  daily Plavix 75mg  daily Lyrica VT q8hr  PULMONARY A: Acute Hypoxic Respiratory Failure - Possible aspiration in setting of acute CVA. Acute resp failure on vent for more then airway protection  P:   Full Vent Suppor Weaning FiO2 for Sat >92% Albuterol Neb prn Lasix 20mg  IV repeat Weaning SBt aggressive  CARDIOVASCULAR A:  H/O Hypertension  H/O Hyperlipidemia RLE Popliteal Artery to Peroneal Artery Bypass - Completed 7/26 H/O Peripheral Vascular Disease  P:  Vascular Surgery Following Continuous Telemetry Monitoring Vitals per unit protocol SBP goal per Neuro Continue Lipitor 20mg  daily, Coreg 25mg  bid, Cozaar 100mg  daily  RENAL A:   Resolving likely atn  P:   Lasix repeat Chem in am  GASTROINTESTINAL A:   Obesity   P:   NPO Tube Feedings to remain unless weaning well Pepcid IV q12hr Senna VT bid  HEMATOLOGIC A:   Anemia - S/P 2 units PRBC's 7/27 & 1u PRBC 7/28. Another 1u PRBC this  AM. Thrombocytopenia - Mild. Likely consumption.  R./o hemol;ysis P:  Trending cell counts daily w/ CBC SCD on LLE for DVT  ASA + Plavix per Neurology  Transfuse for Hgb <7.0 Repeat CBC in am  CT Abd/Pelvis neg bleed Get retic count, LDH hapto etc  INFECTIOUS A:   No evidence of acute infection.  P:   Monitor fevers  ENDOCRINE A:   H/O DM - Hyperglycemia. Off insulin gtt. A1c 8.1.   P:   Levemir 15u [place] q12hr , increase Accu-Checks q4hr  SSI per Moderate Dose Algorithm  FAMILY  - Updates: I updated daughter  -  Inter-disciplinary family meet or Palliative Care meeting due by:  8/4  Ccm time 30 min   Lavon Paganini. Titus Mould, MD, [place] Pgr: [facility]

## 2016-03-04 NOTE — Care Management Note (Signed)
Case Management Note  Patient Details  Name: [name] MRN: [id number] Date of Birth: 1940/09/01  Subjective/Objective: Pt admitted on 02/28/16 for vascular surgery with postoperative stroke.  PTA, pt resided at home with spouse, and is mostly wheelchair bound, per daughter.  She has hx of using [facility] for [facility] needs.                     Action/Plan: Met with pt's daughter and son at bedside to discuss dc plans.  Pt remains currently on ventilator.  Adult children inquired about rehab at SNF upon dc, and state pt has been admitted to [facility] in the past for rehab.  Daughter states that they would prefer something in the [place] area if possible, and are not interested in going back to [facility].  Explained process of PT/OT consults once extubated to get recommendations for LOC needed at dc; if SNF recommended CSW will follow up with them.  They verbalize understanding of this, and state they will await PT/OT involvement once ET tube is out.  Will follow progress.    Expected Discharge Date:                  Expected Discharge Plan:  Skilled Nursing Facility  In-House Referral:  Clinical Social Work  Discharge planning Services  CM Consult  Post Acute Care Choice:    Choice offered to:     DME Arranged:    DME Agency:     HH Arranged:    [place] Agency:     Status of Service:  In process, will continue to follow  If discussed at Long Length of Stay Meetings, dates discussed:    Additional Comments:  Reinaldo Raddle, RN, BSN  Trauma/Neuro ICU Case Manager [phone]

## 2016-03-05 ENCOUNTER — Inpatient Hospital Stay (HOSPITAL_COMMUNITY): Payer: Medicare HMO

## 2016-03-05 DIAGNOSIS — S91301S Unspecified open wound, right foot, sequela: Secondary | ICD-10-CM

## 2016-03-05 DIAGNOSIS — E1159 Type 2 diabetes mellitus with other circulatory complications: Secondary | ICD-10-CM

## 2016-03-05 DIAGNOSIS — I10 Essential (primary) hypertension: Secondary | ICD-10-CM

## 2016-03-05 DIAGNOSIS — E785 Hyperlipidemia, unspecified: Secondary | ICD-10-CM

## 2016-03-05 DIAGNOSIS — Z794 Long term (current) use of insulin: Secondary | ICD-10-CM

## 2016-03-05 DIAGNOSIS — M7989 Other specified soft tissue disorders: Secondary | ICD-10-CM

## 2016-03-05 DIAGNOSIS — R531 Weakness: Secondary | ICD-10-CM

## 2016-03-05 DIAGNOSIS — E872 Acidosis: Secondary | ICD-10-CM

## 2016-03-05 DIAGNOSIS — M79609 Pain in unspecified limb: Secondary | ICD-10-CM

## 2016-03-05 DIAGNOSIS — J9602 Acute respiratory failure with hypercapnia: Secondary | ICD-10-CM

## 2016-03-05 LAB — TRIGLYCERIDES: TRIGLYCERIDES: 88 mg/dL (ref <150)

## 2016-03-05 LAB — COMPREHENSIVE METABOLIC PANEL
ALBUMIN: 2.1 g/dL — AB (ref 3.5–5.0)
ALT: 24 U/L (ref 14–54)
ANION GAP: 4 — AB (ref 5–15)
AST: 15 U/L (ref 15–41)
Alkaline Phosphatase: 51 U/L (ref 38–126)
BILIRUBIN TOTAL: 0.3 mg/dL (ref 0.3–1.2)
BUN: 44 mg/dL — AB (ref 6–20)
CHLORIDE: 113 mmol/L — AB (ref 101–111)
CO2: 31 mmol/L (ref 22–32)
Calcium: 8.6 mg/dL — ABNORMAL LOW (ref 8.9–10.3)
Creatinine, Ser: 0.88 mg/dL (ref 0.44–1.00)
GFR calc Af Amer: >60 mL/min (ref >60)
GFR calc non Af Amer: >60 mL/min (ref >60)
GLUCOSE: 238 mg/dL — AB (ref 65–99)
POTASSIUM: 3.5 mmol/L (ref 3.5–5.1)
SODIUM: 148 mmol/L — AB (ref 135–145)
TOTAL PROTEIN: 5 g/dL — AB (ref 6.5–8.1)

## 2016-03-05 LAB — CBC WITH DIFFERENTIAL/PLATELET
BASOS ABS: 0 10*3/uL (ref 0.0–0.1)
Basophils Relative: 0 %
EOS PCT: 1 %
Eosinophils Absolute: 0.1 10*3/uL (ref 0.0–0.7)
HEMATOCRIT: 25.5 % — AB (ref 36.0–46.0)
Hemoglobin: 7.8 g/dL — ABNORMAL LOW (ref 12.0–15.0)
LYMPHS ABS: 1 10*3/uL (ref 0.7–4.0)
LYMPHS PCT: 8 %
MCH: 28.4 pg (ref 26.0–34.0)
MCHC: 30.6 g/dL (ref 30.0–36.0)
MCV: 92.7 fL (ref 78.0–100.0)
MONO ABS: 1.4 10*3/uL — AB (ref 0.1–1.0)
MONOS PCT: 12 %
NEUTROS ABS: 9.5 10*3/uL — AB (ref 1.7–7.7)
Neutrophils Relative %: 79 %
PLATELETS: 158 10*3/uL (ref 150–400)
RBC: 2.75 MIL/uL — ABNORMAL LOW (ref 3.87–5.11)
RDW: 17.2 % — AB (ref 11.5–15.5)
WBC: 12.1 10*3/uL — ABNORMAL HIGH (ref 4.0–10.5)

## 2016-03-05 LAB — CULTURE, RESPIRATORY W GRAM STAIN: Special Requests: NORMAL

## 2016-03-05 LAB — GLUCOSE, CAPILLARY
GLUCOSE-CAPILLARY: 149 mg/dL — AB (ref 65–99)
GLUCOSE-CAPILLARY: 229 mg/dL — AB (ref 65–99)
Glucose-Capillary: 208 mg/dL — ABNORMAL HIGH (ref 65–99)
Glucose-Capillary: 252 mg/dL — ABNORMAL HIGH (ref 65–99)
Glucose-Capillary: 276 mg/dL — ABNORMAL HIGH (ref 65–99)
Glucose-Capillary: 195 mg/dL — ABNORMAL HIGH (ref 65–99)
Glucose-Capillary: 122 mg/dL — ABNORMAL HIGH (ref 65–99)

## 2016-03-05 LAB — MAGNESIUM: Magnesium: 2.3 mg/dL (ref 1.7–2.4)

## 2016-03-05 LAB — HAPTOGLOBIN: Haptoglobin: 325 mg/dL — ABNORMAL HIGH (ref 34–200)

## 2016-03-05 LAB — TROPONIN I: Troponin I: 0.11 ng/mL (ref <0.03)

## 2016-03-05 LAB — T3, FREE: T3 FREE: 1.3 pg/mL — AB (ref 2.0–4.4)

## 2016-03-05 LAB — CULTURE, RESPIRATORY: CULTURE: NORMAL

## 2016-03-05 LAB — PHOSPHORUS: Phosphorus: 2.5 mg/dL (ref 2.5–4.6)

## 2016-03-05 MED ORDER — INSULIN DETEMIR 100 UNIT/ML ~~LOC~~ SOLN: 35.0000 [IU] | Freq: Two times a day (BID) | SUBCUTANEOUS | Status: DC

## 2016-03-05 MED ORDER — POTASSIUM CHLORIDE 10 MEQ/50ML IV SOLN
10.0000 meq | INTRAVENOUS | Status: AC
  Administered 2016-03-05 (×2): 10 meq via INTRAVENOUS
  Filled 2016-03-05 (×2): qty 50

## 2016-03-05 MED ORDER — INSULIN ASPART 100 UNIT/ML ~~LOC~~ SOLN
4.0000 [IU] | SUBCUTANEOUS | Status: DC
  Administered 2016-03-05 – 2016-03-08 (×14): 4 [IU] via SUBCUTANEOUS

## 2016-03-05 MED ORDER — AMLODIPINE BESYLATE 10 MG PO TABS
10.0000 mg | ORAL_TABLET | Freq: Every day | ORAL | Status: DC
  Administered 2016-03-05 – 2016-03-13 (×9): 10 mg via ORAL
  Filled 2016-03-05 (×9): qty 1

## 2016-03-05 MED ORDER — INSULIN DETEMIR 100 UNIT/ML ~~LOC~~ SOLN
25.0000 [IU] | Freq: Two times a day (BID) | SUBCUTANEOUS | Status: DC
  Administered 2016-03-05 – 2016-03-06 (×2): 25 [IU] via SUBCUTANEOUS
  Filled 2016-03-05 (×3): qty 0.25

## 2016-03-05 MED ORDER — INSULIN DETEMIR 100 UNIT/ML ~~LOC~~ SOLN
25.0000 [IU] | Freq: Two times a day (BID) | SUBCUTANEOUS | Status: DC
  Filled 2016-03-05: qty 0.25

## 2016-03-05 MED ORDER — FENTANYL CITRATE (PF) 100 MCG/2ML IJ SOLN
50.0000 ug | INTRAMUSCULAR | Status: DC | PRN
  Administered 2016-03-06: 50 ug via INTRAVENOUS
  Filled 2016-03-05: qty 2

## 2016-03-05 NOTE — Progress Notes (Signed)
Inpatient Diabetes Program Recommendations  AACE/ADA: New Consensus Statement on Inpatient Glycemic Control (2015)  Target Ranges:  Prepandial:   less than 140 mg/dL      Peak postprandial:   less than 180 mg/dL (1-2 hours)      Critically ill patients:  140 - 180 mg/dL   Lab Results  Component Value Date   GLUCAP 252 (H) 03/05/2016   HGBA1C 7.7 (H) 03/02/2016    Review of Glycemic Control   Results for CHALITA, BOUGHMAN (MRN [id number]) as of 03/05/2016 12:40  Ref. Range 03/04/2016 16:43 03/04/2016 19:30 03/04/2016 23:31 03/05/2016 03:26 03/05/2016 07:43  Glucose-Capillary Latest Ref Range: 65 - 99 mg/dL 217 (H) 248 (H) 229 (H) 208 (H) 252 (H)    Current orders for Inpatient glycemic control: Levemir 25 units bid, Novolog 0-15 units Q4H, Novolog 4 units q4h  Inpatient Diabetes Program Recommendations: Agree with Levemir increase today from 20 units bid to 25 units bid. Will follow.   Gentry Fitz, RN, BA, MHA, CDE Diabetes Coordinator Inpatient Diabetes Program  [phone] (Team Pager) [phone] ([place]) 03/05/2016 1:00 PM

## 2016-03-05 NOTE — Progress Notes (Signed)
   Daily Progress Note  Assessment/Planning: POD #6 s/p R pop to peroneal bypass with ips rGSV,  POD #4 s/p L ICA stenting by NeuroIR, B CVA, Ansarca, severe protein malnutrition, severe supraclinoid L ICA stenosis   Good diuresis yesterday.  BP continue to be stable   Neuro improved reportedly bit R leg > R arm movement  Hopefully extubate over the next 1-2 days with additional diuresis   R foot appears improved from last week: continue wound care  Subjective  - 4 Days Post-Op  No events overnight, improved SBT tolerance, improved neuro exam  Objective Vitals:   03/05/16 0824 03/05/16 0900 03/05/16 1000 03/05/16 1100  BP: (!) 145/46 (!) 180/56 (!) 172/46 (!) 130/105  Pulse:  70 73 77  Resp: 14 17 16 19   Temp:      TempSrc:      SpO2: 100% 100% 100% 100%  Weight:      Height:        Intake/Output Summary (Last 24 hours) at 03/05/16 1134 Last data filed at 03/05/16 1000  Gross per 24 hour  Intake           1082.5 ml  Output             3590 ml  Net          -2507.5 ml   NEURO Sedated PULM  On vent CV  RRR GI  soft, NTND VASC  Warm R foot, dopplerable peroneal, wound clean  Laboratory CBC    Component Value Date/Time   WBC 12.1 (H) 03/05/2016 0600   HGB 7.8 (L) 03/05/2016 0600   HCT 25.5 (L) 03/05/2016 0600   PLT 158 03/05/2016 0600    BMET    Component Value Date/Time   NA 148 (H) 03/05/2016 0600   K 3.5 03/05/2016 0600   CL 113 (H) 03/05/2016 0600   CO2 31 03/05/2016 0600   GLUCOSE 238 (H) 03/05/2016 0600   BUN 44 (H) 03/05/2016 0600   CREATININE 0.88 03/05/2016 0600   CALCIUM 8.6 (L) 03/05/2016 0600   GFRNONAA >60 03/05/2016 0600   GFRAA >60 03/05/2016 0600    Adele Barthel, MD [facility]: [phone] Pager: [phone]  03/05/2016, 11:34 AM

## 2016-03-05 NOTE — Progress Notes (Signed)
VASCULAR LAB PRELIMINARY  PRELIMINARY  PRELIMINARY  PRELIMINARY  Bilateral lower extremity venous duplex has been completed.      Bilateral:  No evidence of DVT, superficial thrombosis, or Baker's Cyst.  Exam was very difficult due to depth of vessels, edema and patient's habitus.  Fadumo Heng, RVT, RDMS 03/05/2016, 7:01 PM

## 2016-03-05 NOTE — Progress Notes (Signed)
PULMONARY / CRITICAL CARE MEDICINE   Name: [name] MRN: [id number] DOB: Jul 04, 1941    ADMISSION DATE:  02/28/2016 CONSULTATION DATE:  03/01/16  REFERRING MD:  Dr. Leonie Man / Stroke MD   CHIEF COMPLAINT:  CVA, Acute Respiratory Failure   HISTORY OF PRESENT ILLNESS:  75 y/o M with PMH of DM, diabetic neuropathy, GERD, HLD, HTN, aortic stenosis who was admitted on 7/26 for right popliteal bypass graft in the setting of chronic right lower extremity wound.  Angiography in 12/2015 revealed bilateral extensive tibial disease.  The patient was initially recommended amputation however, she refused.  On 7/26 she underwent a right below the knee popliteal artery to peroneal artery bypass. Postoperatively she was treated with heparin and transitioned to Plavix on 7/27. JP drain postoperatively had high output. She was evaluated by physical therapy and recommended for skilled nursing facility placement. Postoperatively there were concern for weak doppler flow. On 7/28, she was noted to have worsening of peroneal signal consistent with early failure and anemia requiring transfusion of 2 units PRBCs. Palliative amputation was recommended.  On re-rounding, the patient was noted to have altered mental status with right-sided weakness and left gaze. Code Stroke called for further evaluation.  Upon*team evaluation, she was noted to have incomprehensible speech and moaning as well as right hemiplegia. She was premedicated with Solu-Medrol and Benadryl for imaging given her history of iodine allergy. Imaging delayed due to difficulty obtaining IV access. She was emergently taken to neuro-interventional radiology where she underwent procedure for revascularization (stent assisted angioplasty) of her left ICA in the setting of thrombus.  The patient was intubated and returned to ICU for further observation.  PCCM consulted for ICU assistance  SUBJECTIVE: trop elevated, NO CP, FC well, int sleepy, neg 2.5 liters  VITAL  SIGNS: BP (!) 172/46   Pulse 73   Temp 98.4 F (36.9 C) (Axillary)   Resp 16   Ht 5\' 5"  (1.651 m)   Wt 112.5 kg (248 lb 0.3 oz)   SpO2 100%   BMI 41.27 kg/m   HEMODYNAMICS:    VENTILATOR SETTINGS: Vent Mode: PSV;CPAP FiO2 (%):  [30 %] 30 % Set Rate:  [12 bmp] 12 bmp Vt Set:  [450 mL] 450 mL PEEP:  [5 cmH20] 5 cmH20 Pressure Support:  [5 cmH20] 5 cmH20 Plateau Pressure:  [9 cmH20-20 cmH20] 9 cmH20  INTAKE / OUTPUT: I/O last 3 completed shifts: In: 3496.2 [I.V.:1343.7; Other:92.5; NG/GT:2010; IV Piggyback:50] Out: [id number] [Urine:4995]  PHYSICAL EXAMINATION:  General: on vent Neuro:FC, moves all ext, rt weaker then left, awakens, coughs well HEENT: jvd, ett PULM: ctA bilateral CV: s1 s2 RRR GI: soft, obese, NT  n d Extremities:  Yes edema, warm, pulses present   LABS:  BMET  Recent Labs Lab 03/04/16 0550 03/04/16 1700 03/05/16 0600  NA 144 148* 148*  K 3.9 3.7 3.5  CL 113* 112* 113*  CO2 26 27 31   BUN 45* 45* 44*  CREATININE 1.09* 0.94 0.88  GLUCOSE 283* 221* 238*    Electrolytes  Recent Labs Lab 03/03/16 0512 03/04/16 0550 03/04/16 1700 03/05/16 0600  CALCIUM 8.2*  8.2* 8.3* 8.3* 8.6*  MG 2.2 2.2  --  2.3  PHOS 3.4  3.4 2.6  --  2.5    CBC  Recent Labs Lab 03/04/16 0550 03/04/16 1752 03/05/16 0600  WBC 10.4 13.5* 12.1*  HGB 7.8* 8.1* 7.8*  HCT 24.5* 26.1* 25.5*  PLT 147* 172 158    Coag's No results for  input(s): APTT, INR in the last 168 hours.  Sepsis Markers  Recent Labs Lab 03/01/16 2030 03/01/16 2042 03/01/16 2350 03/02/16 0600 03/03/16 0512  LATICACIDVEN  --  1.9 1.9  --   --   PROCALCITON 0.32  --   --  0.41 0.28    ABG  Recent Labs Lab 03/01/16 1623 03/04/16 1322  PHART 7.379 7.539*  PCO2ART 42.6 32.3*  PO2ART 268.0* 142.0*    Liver Enzymes  Recent Labs Lab 03/04/16 0550 03/04/16 1200 03/05/16 0600  AST 17 19 15   ALT 22 26 24   ALKPHOS 49 55 51  BILITOT 0.6 0.5 0.3  ALBUMIN 2.1* 2.2* 2.1*     Cardiac Enzymes  Recent Labs Lab 03/04/16 1752 03/04/16 2330  TROPONINI 0.10* 0.11*    Glucose  Recent Labs Lab 03/04/16 1236 03/04/16 1643 03/04/16 1930 03/04/16 2331 03/05/16 0326 03/05/16 0743  GLUCAP 254* 217* 248* 229* 208* 252*    Imaging Dg Chest Port 1 View  Result Date: 03/05/2016 CLINICAL DATA:  acute respiratory acidosis EXAM: PORTABLE CHEST 1 VIEW COMPARISON:  March 04, 2016 FINDINGS: Endotracheal tube tip is 4.1 cm above the carina. Central catheter tip is in the superior vena cava. Nasogastric tube tip and side port are below the diaphragm. No pneumothorax. There is interstitial edema in the mid and lower lung zones with bilateral pleural effusions and bibasilar atelectasis. There is cardiomegaly with mild pulmonary venous hypertension. No adenopathy evident. No bone lesions. IMPRESSION: Tube and catheter positions as described without pneumothorax. Evidence of underlying congestive heart failure. Bibasilar atelectasis. Electronically Signed   By: Lowella Grip III M.D.   On: 03/05/2016 07:43     STUDIES:  CTA Head / Neck 7/28: At risk brain in the L MCA territory showing normal cerebral blood volume but decreased cerebral flow CT Cerebral Perfusion 7/28: near occlusion of the L ICA at the origin Abd X-ray 7/28: Enteric catheter with tip overlying the peripyloric region. Port CXR 7/28: Endotracheal tube in good position. Enteric feeding tube coursing below diaphragm. Right internal jugular central venous catheter in place. Low lung volumes. Bilateral basilar opacities. MRI/MRA of Brain 7/29: Acute patchy ischemic infarcts left basal ganglia & internal capsule with extension into periventricular white matter of left corona radiata/centrum semi-ovale. No hemorrhage or mass effect. Scattered small punctate cortical & white matter infarcts involving bilateral cerebral hemispheres & rites or bowel movement. No associated mass or hemorrhage. Calcified lesion right  cerebellar hemisphere likely meningioma. No large vessel occlusion within intracranial circulation. Short segment severe stenosis left ICA. Severe stenosis proximal right PCA. Multifocal atheromatous irregularity involving anterior & posterior circulations without severe or correctable stenosis. TTE 7/29: Moderate concentric LVH. EF 55-60%. Grade 2 diastolic dysfunction. Cannot exclude wall motion abnormality. LA moderately dilated & RA normal in size. RV normal in size and function. Mild aortic regurgitation without stenosis. No mitral regurgitation or stenosis. No pulmonic stenosis. No tricuspid regurgitation. Small circumferential pericardial effusion.  Port CXR 7/30: Enteric feeding tube coursing below diaphragm. Endotracheal tube in good position. Bilateral basilar opacities unchanged. Right internal jugular central venous catheter unchanged in position. 7/30 CT abdo - no bleed  MICROBIOLOGY: MRSA PCR 7/24:  Negative Tracheal Asp Ctx 7/29>>>  ANTIBIOTICS: None  SIGNIFICANT EVENTS: 7/26 - Admit for R popliteal artery to peroneal artery bypass 7/28 - Code Stroke called for R sided weakness, to neuro IR with near occlusion of L ICA s/p revascularization     LINES/TUBES: OETT 7/28 >>  R IJ Dual Lumen 7/28 >>  Foley 7/26 >> L Fem Sheat 7/28 - 7/29 L Fem Art Line 7/28 - 7/29 R UE Midline IV 7/28 >>  ASSESSMENT / PLAN:  NEUROLOGIC A:   L ICA CVA - S/P Revascularization 7/28 Sedation on Ventilator  P:   RASS goal: 0 Propofol gtt, off Fentanyl off to prn  Management per Neuro IR/Stroke Service ASA 325mg  daily Plavix 75mg  daily Lyrica VT q8hr  PULMONARY A: Acute Hypoxic Respiratory Failure - Possible aspiration in setting of acute CVA. Acute resp failure on vent for more then airway protection  P:   Full Vent Suppor Weaning FiO2 for Sat >92% Albuterol Neb prn Lasix see renal Weaning SBt aggressive, cpap 5 ps 5, goal 1 hr ,assess rsbi Assess cough  CARDIOVASCULAR A:   H/O Hypertension  H/O Hyperlipidemia RLE Popliteal Artery to Peroneal Artery Bypass - Completed 7/26 H/O Peripheral Vascular Disease  P:  SBP goal per Neuro Continue Lipitor 20mg  daily, Coreg 25mg  bid, Cozaar 100mg  daily lasix  RENAL A:   Resolving likely atn Relative hypokalemia P:   Lasix repeat same dose as prior Chem in am kco runs  GASTROINTESTINAL A:   Obesity   P:   NPO Tube Feedings, may hold for extubation Pepcid IV q12hr Senna VT bid  HEMATOLOGIC A:   Anemia - S/P 2 units PRBC's 7/27 & 1u PRBC 7/28. Another 1u PRBC this AM. Thrombocytopenia - Mild. Likely consumption.  R./o hemol;ysis P:  Trending cell counts daily w/ CBC SCD on LLE for DVT  ASA + Plavix per Neurology  Transfuse for Hgb <7.0 Repeat CBC in am  Get retic count, LDH hapto  - no evidence hemolysis thus far  INFECTIOUS A:   No evidence of acute infection.  P:   Monitor fevers  ENDOCRINE A:   H/O DM - Hyperglycemia. Off insulin gtt. A1c 8.1.   P:   Levemir increase 25 bid Accu-Checks q4hr  SSI per Moderate Dose Algorithm Add TF coverage if not extubated  FAMILY  - Updates: I updated daughter  - Inter-disciplinary family meet or Palliative Care meeting due by:  8/4  Ccm time 30 min   Lavon Paganini. Titus Mould, MD, [place] Pgr: [facility]

## 2016-03-05 NOTE — Progress Notes (Signed)
OT Cancellation Note  Patient Details Name: [name] MRN: [id number] DOB: 1940/08/17   Cancelled Treatment:    Reason Eval/Treat Not Completed: Patient not medically ready;Medical issues which prohibited therapy Pt with new stroke please reorder OT when appropriate.  Peri Maris   [id number] .  03/05/2016, 7:57 AM

## 2016-03-05 NOTE — Progress Notes (Signed)
Wasted 65 mL Fentanyl into sink with [place], RN.

## 2016-03-05 NOTE — Clinical Social Work Placement (Signed)
   CLINICAL SOCIAL WORK PLACEMENT  NOTE  Date:  03/05/2016  Patient Details  Name: [name] MRN: [id number] Date of Birth: 14-Apr-1941  Clinical Social Work is seeking post-discharge placement for this patient at the [place] level of care (*CSW will initial, date and re-position this form in  chart as items are completed):  Yes   Patient/family provided with [facility] Work Department's list of facilities offering this level of care within the geographic area requested by the patient (or if unable, by the patient's family).  Yes   Patient/family informed of their freedom to choose among providers that offer the needed level of care, that participate in Medicare, Medicaid or managed care program needed by the patient, have an available bed and are willing to accept the patient.  Yes   Patient/family informed of [facility]'s ownership interest in [facility] and [facility], as well as of the fact that they are under no obligation to receive care at these facilities.  PASRR submitted to EDS on       PASRR number received on       Existing PASRR number confirmed on 03/05/16     FL2 transmitted to all facilities in geographic area requested by pt/family on 03/05/16     FL2 transmitted to all facilities within larger geographic area on       Patient informed that his/her managed care company has contracts with or will negotiate with certain facilities, including the following:            Patient/family informed of bed offers received.  Patient chooses bed at       Physician recommends and patient chooses bed at      Patient to be transferred to   on  .  Patient to be transferred to facility by       Patient family notified on   of transfer.  Name of family member notified:        PHYSICIAN Please prepare priority discharge summary, including medications, Please prepare prescriptions, Please sign FL2     Additional Comment:     _______________________________________________ Rigoberto Noel, LCSW 03/05/2016, 4:35 PM

## 2016-03-05 NOTE — Progress Notes (Signed)
STROKE TEAM PROGRESS NOTE   SUBJECTIVE (INTERVAL HISTORY) Patient more awake today than yesterday. Following a few commands both central and peripheral commands. Weaned for 5 hours yesterday. Daughter at bedside. Elevated troponin over night, EKG unchanged. Off propofol this am. Still intubated and possible extubation today.    OBJECTIVE Temp:  [97.7 F (36.5 C)-98.4 F (36.9 C)] 98.2 F (36.8 C) (08/01 0400) Pulse Rate:  [57-78] 59 (08/01 0700) Cardiac Rhythm: Normal sinus rhythm (08/01 0400) Resp:  [11-22] 14 (08/01 0824) BP: (122-161)/(24-128) 145/46 (08/01 0824) SpO2:  [100 %] 100 % (08/01 0824) FiO2 (%):  [30 %] 30 % (08/01 0824) Weight:  [112.5 kg (248 lb 0.3 oz)] 112.5 kg (248 lb 0.3 oz) (08/01 0600)  CBC:   Recent Labs Lab 03/04/16 0550 03/04/16 1752 03/05/16 0600  WBC 10.4 13.5* 12.1*  NEUTROABS 7.8*  --  9.5*  HGB 7.8* 8.1* 7.8*  HCT 24.5* 26.1* 25.5*  MCV 91.4 90.9 92.7  PLT 147* 172 0000000    Basic Metabolic Panel:   Recent Labs Lab 03/04/16 0550 03/04/16 1700 03/05/16 0600  NA 144 148* 148*  K 3.9 3.7 3.5  CL 113* 112* 113*  CO2 26 27 31   GLUCOSE 283* 221* 238*  BUN 45* 45* 44*  CREATININE 1.09* 0.94 0.88  CALCIUM 8.3* 8.3* 8.6*  MG 2.2  --  2.3  PHOS 2.6  --  2.5    Lipid Panel:     Component Value Date/Time   CHOL 109 03/02/2016 0600   TRIG 88 03/05/2016 0600   HDL 32 (L) 03/02/2016 0600   CHOLHDL 3.4 03/02/2016 0600   VLDL 24 03/02/2016 0600   LDLCALC 53 03/02/2016 0600   HgbA1c:  Lab Results  Component Value Date   HGBA1C 7.7 (H) 03/02/2016   Urine Drug Screen:     Component Value Date/Time   LABOPIA NONE DETECTED 02/12/2014 0110   COCAINSCRNUR NONE DETECTED 02/12/2014 0110   LABBENZ NONE DETECTED 02/12/2014 0110   AMPHETMU NONE DETECTED 02/12/2014 0110   THCU NONE DETECTED 02/12/2014 0110   LABBARB NONE DETECTED 02/12/2014 0110      IMAGING Dr. Erlinda Hong has personally reviewed the radiological images below and agree with the  radiology interpretations.  Ct Head Code Stroke Wo Contrast 03/01/2016 Age related volume loss with mild periventricular small vessel disease. No acute infarct evident currently. No edema or hemorrhage. No hyperdense vessel evident. Basal ganglia calcification is physiologic.   Ct Angio Head and Neck W Or Wo Contrast 03/01/2016 Near occlusion of the left ICA at the origin. Reduced caliber of the left cervical internal carotid artery because of reduced inflow. 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 Stenoses estimated at 50% bilaterally. Probable serial stenoses. No M1 stenosis and no missing M2 branches on the left.    Ct Cerebral Perfusion W Contrast 03/01/2016 At risk brain in the left MCA territory showing normal cerebral blood volume but decreased cerebral blood flow, mean transit time and time to peak. No evidence of completed infarction.   Cerebral Angiogram 03/01/2016 S./P bilateral  Common  Carotid arteriogram followed by by endovascular revascularization of  preocclusive lt ICA stenosis prox with a thrombus with stent assisted angioplasty and 4.5 mg of IA Integrelin TICI flow MCA distribution  Ct Head Wo Contrast 03/01/2016 No change or acute finding following left carotid artery stent.   MRI HEAD  03/02/2016 1. Acute patchy ischemic infarcts involving left basal ganglia and internal capsule, with extension into  periventricular white matter of the left corona radiata/centrum semi ovale. No associated hemorrhage or mass effect.  2. Additional scattered small punctate cortical and white matter infarcts involving the bilateral cerebral hemispheres and right cerebellum as above. No associated hemorrhage or mass effect.  3. 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 No associated edema.   MRA HEAD 03/02/2016 1. No large vessel occlusion within the intracranial circulation.   2. Short-segment severe stenosis at the supraclinoid left ICA.  3. Severe stenosis proximal right PCA with attenuated flow distally.  4. Additional multi focal atheromatous irregularity involving the anterior posterior circulations as above without severe or correctable stenosis.  5. Hypoplastic/absent left A1 segment, likely accounting for the diminutive left ICA is compared to the right.   Transthoracic echocardiogram 03/02/2016 - Left ventricle: The cavity size was normal. There was moderate concentric hypertrophy. Systolic function was normal. The estimated ejection fraction was in the range of 55% to 60%. Although no diagnostic regional wall motion abnormality was identified, this possibility cannot be completely excluded on the basis of this study. Features are consistent with a pseudonormal left ventricular filling pattern, with concomitant abnormal relaxation and increased filling pressure (grade 2 diastolic dysfunction). - Aortic valve: There was mild stenosis. Valve area (VTI): 1.28 cm^2. Valve area (Vmax): 1.18 cm^2. Valve area (Vmean): 1.04 cm^2. - Left atrium: The atrium was moderately dilated. - Pericardium, extracardiac: A small pericardial effusion was identified circumferential to the heart. Impressions: - There is an echo free space posterior the left ventricle that most likely represents a left pleural effusion, but image quality is poor. Cannot entirely exclude a loculated pericardial efusion. Correlate with radiological studies.  LE venous doppler - pending  CXR 03/05/2016 Tube and catheter positions as described without pneumothorax. Evidence of underlying congestive heart failure. Bibasilar atelectasis.   PHYSICAL EXAM  Temp:  [97.7 F (36.5 C)-98.4 F (36.9 C)] 98.2 F (36.8 C) (08/01 0400) Pulse Rate:  [57-78] 59 (08/01 0700) Resp:  [11-22] 14 (08/01 0824) BP: (122-161)/(24-128) 145/46 (08/01 0824) SpO2:  [100 %] 100 % (08/01 0824) FiO2 (%):  [30 %] 30 % (08/01  0824) Weight:  [112.5 kg (248 lb 0.3 oz)] 112.5 kg (248 lb 0.3 oz) (08/01 0600)  General - Well nourished, well developed, intubated off sedation.  Ophthalmologic - Fundi not visualized due to noncooperation.  Cardiovascular - Regular rate and rhythm.  Extremities - right foot proximal to big toe wound with dressing  Neuro - intubated off sedation, eyes open on voice. Able to follow most central commands and peripheral command on the right. PERRL, mid position, able to move to the left but not on the right. Positive corneal and gag. On pain stimulation, LUE purposeful movement, RUE trace withdraw to pain, BLE withdraw to pain. DTR 1+ and no babinski. Sensation, coordination and gait not tested.   ASSESSMENT/PLAN [name] is a 76 y.o. female with history of hypertension, hyperlipidemia, diabetes with diabetic neuropathy, aortic stenosis, and a nonhealing ischemic right foot ulcer status post right lower extremity bypass graft surgery performed 02/28/2016 who developed aphasia, right hemiplegia, and left-sided gaze preference in hopsital. She did not receive IV t-PA due to recent surgery. CT angio showed a near occlusion of the L ICA , perfusion showed brain at risk. She was taken to IR where she received stent assisted angioplasty and IA Integrelin; a L ICA stent was placed.  Stroke:  Bilateral infarcts felt to be embolic from  unclear source, concerning for DVT. 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  Severe stenosis proximal right PCA.  CTA head and neck - near occlusion L ICA, R ICA 50%, serial stenosis  2D Echo - EF 55-60%. No cardiac source of emboli identified.   LE venous doppler pending. If positive, will consider  TCD bubble study too.  LDL - 53  HgbA1c 8.1  VTE prophylaxis - SCDs Diet NPO time specified  aspirin 81 mg daily prior to admission, now on aspirin 325 mg daily and clopidogrel 75 mg daily.    Patient counseled to be compliant with her antithrombotic medications  Ongoing aggressive stroke risk factor management  Therapy recommendations: Pending  Disposition: Pending (lived at home, since fall 2 yrs ago wheelchair bound at baseline could stand for xfer, at [facility] in the past)  Acute Hypoxic Respiratory Failure  Lethargy at stroke onset  Intubated prior to procedure  Weaned for 5 hrs yesterday  Possible extubation today  CCM managing  Anemia   received 2 unit PRBCs 7/27, 1u 7/28, 1u 7/30.   Hgb 6.6->8.2->7.8->8.1->7.8  Close monitoring  Continue DAPT for left ICA stent  CT abd - no source of bleeding  Fecal occult blood - negative  Hypertension  Blood pressure run somewhat low at times  BP goal normotensive  On coreg 25 bid, lasix 60mg  bid, cozaar 100mg  daily  Add amlodipine 10mg    Hyperlipidemia  Home meds:  Lipitor 20 mg daily resumed in hospital  LDL 53, goal < 70  Continue statin at discharge  Diabetes  HgbA1c 8.1, goal < 7.0  Uncontrolled - glucoses running high  SSI  On levemir 25 bid  Add novolog 4u q4h with tube feeding  Appreciate DM education RN assistance   Other Stroke Risk Factors  Advanced age  Morbid Obesity, Body mass index is 41.27 kg/m., recommend weight loss, diet and exercise as appropriate   PVD - RLE popliteal artery to peroneal artery bypass 02/28/2016  Other Active Problems  Thrombocytopenia - 143 -> 158  Leukocytosis 15.2 -> 10.4->13.5->12.1  Increased BUN and creatinine - 1.28 -> 0.88  Right foot wound - VVS on board  Elevated troponin 0.1, EKG unchanged - T wave inversion lateral limb and precordial leads, not new, no change in management  Hospital day # 6   This patient is critically ill due  to bilateral embolic stroke, left ICA stenosis s/p stent, VDRF, anemia and at significant risk of neurological worsening, death form recurrent stroke, hemorrhagic transformation, in-stent restenosis, shock. This patient's care requires constant monitoring of vital signs, hemodynamics, respiratory and cardiac monitoring, review of multiple databases, neurological assessment, discussion with family, other specialists and medical decision making of high complexity. I spent 45 minutes of neurocritical care time in the care of this patient.  [name] Hawking, MD PhD Stroke Neurology 03/05/2016 11:39 AM    To contact Stroke Continuity provider, please refer to [email or web address] After hours, contact General Neurology

## 2016-03-05 NOTE — Clinical Social Work Note (Signed)
Clinical Social Work Assessment  Patient Details  Name: [name] MRN: [id number] Date of Birth: 11-Sep-1940  Date of referral:  03/05/16               Reason for consult:  Facility Placement, Discharge Planning                Permission sought to share information with:  Facility Sport and exercise psychologist, Family Supports Permission granted to share information::  No (Patient unable to give consent, spoke with husband.)  Name::     Blima Dessert::  SNFs  Relationship::     Contact Information:     Housing/Transportation Living arrangements for the past 2 months:  Single Family Home Source of Information:  Spouse Patient Interpreter Needed:  None Criminal Activity/Legal Involvement Pertinent to Current Situation/Hospitalization:  No - Comment as needed Significant Relationships:  Spouse Lives with:  Spouse Do you feel safe going back to the place where you live?  Yes Need for family participation in patient care:  Yes (Comment)  Care giving concerns:  The patient's husband does not report any care giving concerns at this time, but does agree with the recommendation for SNF placement at discharge.   Social Worker assessment / plan:  CSW spoke with patient's husband by phone to complete assessment. The husband states that the patient has been to [facility] SNF in the past but he thinks the patient will want to go to a facility in [place]. CSW explained SNF search/placement process and answered the husband's questions. The husband plans to discuss SNF placement with the patient at a more appropriate time. CSW will make referrals and followup with bed offers once available.   Employment status:  Retired Nurse, adult PT Recommendations:  [place] / Referral to community resources:  [place]  Patient/Family's Response to care:  The patient's husband appears happy with the care the patient has received. He appreciates  CSWs assistance with SNF placement.  Patient/Family's Understanding of and Emotional Response to Diagnosis, Current Treatment, and Prognosis:  The patient's husband appears to have a fair understanding of the reason for the patient's hospitalization. He appears to be coping well at this time and remains hopefull that the patient will recover with PT.  Emotional Assessment Appearance:  Appears stated age Attitude/Demeanor/Rapport:  Unable to Assess Affect (typically observed):  Unable to Assess Orientation:  Oriented to Self Alcohol / Substance use:  Not Applicable Psych involvement (Current and /or in the community):  No (Comment)  Discharge Needs  Concerns to be addressed:  Discharge Planning Concerns, Care Coordination Readmission within the last 30 days:  No Current discharge risk:  Physical Impairment, Cognitively Impaired Barriers to Discharge:  Continued Medical Work up   Rigoberto Noel, LCSW 03/05/2016, 4:41 PM

## 2016-03-05 NOTE — NC FL2 (Signed)
[place] MEDICAID FL2 LEVEL OF CARE SCREENING TOOL     IDENTIFICATION  Patient Name: [name] Birthdate: 03-05-1941 Sex: female Admission Date (Current Location): 02/28/2016  [facility] and [place] Number:  [employer] and Address:  [facility] [place], [place] [address] [place], [place] [address]      Provider Number: [id number]  Attending Physician Name and Address:  Rosalin Hawking, MD  Relative Name and Phone Number:       Current Level of Care: Hospital Recommended Level of Care: [place] Prior Approval Number:    Date Approved/Denied:   PASRR Number:   [id number]   Discharge Plan: SNF    Current Diagnoses: Patient Active Problem List   Diagnosis Date Noted  . Surgery, elective   . Anemia, iron deficiency   . Leukocytosis   . Cerebrovascular accident (CVA) due to thrombosis of precerebral artery ([place]) 03/01/2016  . 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 Acute respiratory failure ([place])   . PAD (peripheral artery disease) ([place]) 02/28/2016  . Atherosclerosis of native arteries of the extremities with ulceration ([place]) 12/08/2015  . Open toe wound 05/04/2014  . Anemia, chronic disease 04/27/2014  . Occult blood positive stool 04/17/2014  . Edema 04/03/2014  . Unspecified hereditary and idiopathic peripheral neuropathy 03/14/2014  . Acute and chronic respiratory failure 02/14/2014  . Type II or unspecified type diabetes mellitus with neurological manifestations, uncontrolled 02/14/2014  . Respiratory failure ([place]) 02/12/2014  . Altered mental status 02/12/2014  . Acute encephalopathy 02/12/2014  . Acute respiratory failure with hypercapnia ([place]) 02/12/2014  . Acute diastolic CHF (congestive heart failure) ([place]) 02/12/2014  . Aortic stenosis 02/04/2014  . Closed left subtrochanteric femur fracture ([place]) 02/03/2014  . Hip fracture, left ([place]) 02/03/2014  . Hypokalemia 02/03/2014  .  Fibula fracture 02/03/2014  . MTP instability 02/03/2014  . Hip fracture ([place]) 02/03/2014  . HTN (hypertension)   . Diabetes mellitus without complication ([place])   . High cholesterol     Orientation RESPIRATION BLADDER Height & Weight     Self  O2 (4L) Continent Weight: 112.5 kg (248 lb 0.3 oz) Height:  _0  (165.1 cm)  BEHAVIORAL SYMPTOMS/MOOD NEUROLOGICAL BOWEL NUTRITION STATUS   (NONE)  (NONE) Incontinent  (Currently NPO)  AMBULATORY STATUS COMMUNICATION OF NEEDS Skin   Extensive Assist Verbally Surgical wounds, Other (Comment) (Incisions of leg right, and hip left. Diabetic foot ulcer of right foot.)                       Personal Care Assistance Level of Assistance  Bathing, Feeding, Dressing Bathing Assistance: Limited assistance Feeding assistance: Independent Dressing Assistance: Limited assistance     Functional Limitations Info  Sight, Hearing, Speech Sight Info: Adequate Hearing Info: Adequate Speech Info: Adequate    SPECIAL CARE FACTORS FREQUENCY  PT (By licensed PT), OT (By licensed OT)     PT Frequency: 5/week OT Frequency: 5/week            Contractures Contractures Info: Not present    Additional Factors Info  Code Status, Allergies Code Status Info: Full Code Allergies Info: Iodine, Penicillins, Shellfish Allergy, Sulfa Antibiotics           Current Medications (03/05/2016):  This is the current hospital active medication list Current Facility-Administered Medications  Medication Dose Route Frequency Provider Last Rate Last Dose  .  stroke: mapping our early stages of recovery book  Does not apply Once Roland Earl, MD      . 0.9 %  sodium chloride infusion   Intravenous Continuous Raylene Miyamoto, MD 10 mL/hr at 03/04/16 1200    . 0.9 %  sodium chloride infusion   Intravenous Once Chesley Mires, MD   Stopped at 03/01/16 2037  . 0.9 %  sodium chloride infusion   Intravenous Once Colbert Coyer, MD      . acetaminophen  (TYLENOL) tablet 1,000 mg  1,000 mg Oral Q6H PRN [name], MD       Or  . acetaminophen (TYLENOL) suppository 650 mg  650 mg Rectal Q6H PRN [name], MD      . albuterol (PROVENTIL) (2.5 MG/3ML) 0.083% nebulizer solution 3 mL  3 mL Inhalation Q6H PRN Ulyses Amor, PA-C      . amLODipine (NORVASC) tablet 10 mg  10 mg Oral Daily Rosalin Hawking, MD   Stopped at 03/05/16 1438  . antiseptic oral rinse solution (CORINZ)  7 mL Mouth Rinse 10 times per day Garvin Fila, MD   7 mL at 03/05/16 1442  . aspirin tablet 325 mg  325 mg Oral Q breakfast [name], MD   325 mg at 03/05/16 0818  . atorvastatin (LIPITOR) tablet 20 mg  20 mg Per Tube Daily Javier Glazier, MD   20 mg at 03/05/16 0946  . carvedilol (COREG) tablet 25 mg  25 mg Oral BID WC Ulyses Amor, PA-C   25 mg at 03/05/16 0818  . chlorhexidine gluconate (SAGE KIT) (PERIDEX) 0.12 % solution 15 mL  15 mL Mouth Rinse BID Garvin Fila, MD   15 mL at 03/05/16 3154  . clopidogrel (PLAVIX) tablet 75 mg  75 mg Per Tube Q breakfast Javier Glazier, MD   75 mg at 03/05/16 0819  . collagenase (SANTYL) ointment   Topical BID Alvia Grove, PA-C      . famotidine (PEPCID) IVPB 20 mg premix  20 mg Intravenous Q12H Donita Brooks, NP   20 mg at 03/05/16 0946  . feeding supplement (PRO-STAT SUGAR FREE 64) liquid 30 mL  30 mL Per Tube BID Donita Brooks, NP   30 mL at 03/05/16 0945  . feeding supplement (VITAL HIGH PROTEIN) liquid 1,000 mL  1,000 mL Per Tube Q24H Colbert Coyer, MD   1,000 mL at 03/04/16 2330  . fentaNYL (SUBLIMAZE) 2,500 mcg in sodium chloride 0.9 % 250 mL (10 mcg/mL) infusion  25-400 mcg/hr Intravenous Continuous Javier Glazier, MD 2.5 mL/hr at 03/05/16 0949 25 mcg/hr at 03/05/16 0949  . fentaNYL (SUBLIMAZE) bolus via infusion 25-100 mcg  25-100 mcg Intravenous Q30 min PRN Javier Glazier, MD   50 mcg at 03/04/16 1956  . fentaNYL (SUBLIMAZE) injection 50 mcg  50 mcg Intravenous Q2H PRN Asencion Partridge,  MD      . ferrous sulfate 300 (60 Fe) MG/5ML syrup 300 mg  300 mg Oral TID Catha Gosselin, MD   300 mg at 03/05/16 0946  . furosemide (LASIX) injection 60 mg  60 mg Intravenous Q12H Raylene Miyamoto, MD   60 mg at 03/05/16 1329  . insulin aspart (novoLOG) injection 0-15 Units  0-15 Units Subcutaneous Q4H Colbert Coyer, MD   8 Units at 03/05/16 1249  . insulin aspart (novoLOG) injection 4 Units  4 Units Subcutaneous Q4H Rosalin Hawking, MD   Stopped at 03/05/16 1225  . insulin detemir (LEVEMIR) injection 25 Units  25 Units Subcutaneous BID Rosalin Hawking, MD      . losartan (COZAAR) tablet 100 mg  100 mg Oral Daily Ulyses Amor, PA-C   100 mg at 03/05/16 0945  . magnesium sulfate IVPB 2 g 50 mL  2 g Intravenous Daily PRN Ulyses Amor, PA-C      . ondansetron [facility]) injection 4 mg  4 mg Intravenous Q6H PRN Ulyses Amor, PA-C      . pregabalin (LYRICA) capsule 50 mg  50 mg Per Tube Q8H Javier Glazier, MD   Stopped at 03/05/16 1439  . propofol (DIPRIVAN) 1000 MG/100ML infusion  0-50 mcg/kg/min Intravenous Continuous Donita Brooks, NP   Stopped at 03/05/16 0800  . sennosides (SENOKOT) 8.8 MG/5ML syrup 5 mL  5 mL Per Tube BID Javier Glazier, MD   5 mL at 03/05/16 0945  . sodium chloride flush (NS) 0.9 % injection 10-40 mL  10-40 mL Intracatheter PRN Conrad Golf, MD      . sodium chloride flush (NS) 0.9 % injection 10-40 mL  10-40 mL Intracatheter PRN Garvin Fila, MD   20 mL at 03/03/16 0502     Discharge Medications: Please see discharge summary for a list of discharge medications.  Relevant Imaging Results:  Relevant Lab Results:   Additional Information SSN: [id number]  Rigoberto Noel, LCSW

## 2016-03-05 NOTE — Procedures (Signed)
Extubation Procedure Note  Patient Details:   Name: [name] DOB: 1940-12-17 MRN: [id number]   Airway Documentation:  Airway (Active)  Secured at (cm) 22 cm 03/05/2016  8:24 AM  Measured From Lips 03/05/2016  8:24 AM  Secured Location Right 03/05/2016  8:24 AM  Secured By [employer] 03/05/2016  8:24 AM  Tube Holder Repositioned Yes 03/05/2016  8:24 AM  Cuff Pressure (cm H2O) 24 cm H2O 03/05/2016  8:24 AM  Site Condition Dry 03/05/2016  8:24 AM   Positive for cuff leak, pt extubated to 4lpm [place], no distress noted at this time.  Evaluation  O2 sats: stable throughout Complications: No apparent complications Patient did tolerate procedure well. Bilateral Breath Sounds: Diminished   No  Gurinder Toral Wyatt Haste 03/05/2016, 12:19 PM

## 2016-03-06 ENCOUNTER — Encounter (HOSPITAL_COMMUNITY): Payer: Self-pay | Admitting: Interventional Radiology

## 2016-03-06 ENCOUNTER — Inpatient Hospital Stay (HOSPITAL_COMMUNITY): Payer: Medicare HMO

## 2016-03-06 DIAGNOSIS — I6300 Cerebral infarction due to thrombosis of unspecified precerebral artery: Secondary | ICD-10-CM

## 2016-03-06 DIAGNOSIS — M6289 Other specified disorders of muscle: Secondary | ICD-10-CM

## 2016-03-06 DIAGNOSIS — J90 Pleural effusion, not elsewhere classified: Secondary | ICD-10-CM

## 2016-03-06 LAB — CBC
HCT: 27 % — ABNORMAL LOW (ref 36.0–46.0)
Hemoglobin: 8.1 g/dL — ABNORMAL LOW (ref 12.0–15.0)
MCH: 28.1 pg (ref 26.0–34.0)
MCHC: 30 g/dL (ref 30.0–36.0)
MCV: 93.8 fL (ref 78.0–100.0)
Platelets: 188 10*3/uL (ref 150–400)
RBC: 2.88 MIL/uL — ABNORMAL LOW (ref 3.87–5.11)
RDW: 17 % — AB (ref 11.5–15.5)
WBC: 13.3 10*3/uL — ABNORMAL HIGH (ref 4.0–10.5)

## 2016-03-06 LAB — COMPREHENSIVE METABOLIC PANEL
ALK PHOS: 62 U/L (ref 38–126)
ALT: 28 U/L (ref 14–54)
AST: 17 U/L (ref 15–41)
Albumin: 2.2 g/dL — ABNORMAL LOW (ref 3.5–5.0)
Anion gap: 5 (ref 5–15)
BUN: 37 mg/dL — AB (ref 6–20)
CALCIUM: 9 mg/dL (ref 8.9–10.3)
CHLORIDE: 113 mmol/L — AB (ref 101–111)
CO2: 36 mmol/L — AB (ref 22–32)
CREATININE: 0.75 mg/dL (ref 0.44–1.00)
GFR calc Af Amer: >60 mL/min (ref >60)
GFR calc non Af Amer: >60 mL/min (ref >60)
GLUCOSE: 187 mg/dL — AB (ref 65–99)
Potassium: 3.3 mmol/L — ABNORMAL LOW (ref 3.5–5.1)
SODIUM: 154 mmol/L — AB (ref 135–145)
Total Bilirubin: 0.3 mg/dL (ref 0.3–1.2)
Total Protein: 5.5 g/dL — ABNORMAL LOW (ref 6.5–8.1)

## 2016-03-06 LAB — MAGNESIUM: MAGNESIUM: 2.2 mg/dL (ref 1.7–2.4)

## 2016-03-06 LAB — GLUCOSE, CAPILLARY
GLUCOSE-CAPILLARY: 236 mg/dL — AB (ref 65–99)
GLUCOSE-CAPILLARY: 182 mg/dL — AB (ref 65–99)
GLUCOSE-CAPILLARY: 174 mg/dL — AB (ref 65–99)
Glucose-Capillary: 190 mg/dL — ABNORMAL HIGH (ref 65–99)
Glucose-Capillary: 154 mg/dL — ABNORMAL HIGH (ref 65–99)
Glucose-Capillary: 229 mg/dL — ABNORMAL HIGH (ref 65–99)

## 2016-03-06 LAB — PHOSPHORUS: PHOSPHORUS: 2.4 mg/dL — AB (ref 2.5–4.6)

## 2016-03-06 LAB — TRIGLYCERIDES: Triglycerides: 71 mg/dL (ref <150)

## 2016-03-06 MED ORDER — INSULIN DETEMIR 100 UNIT/ML ~~LOC~~ SOLN
30.0000 [IU] | Freq: Two times a day (BID) | SUBCUTANEOUS | Status: DC
  Administered 2016-03-06 – 2016-03-07 (×2): 30 [IU] via SUBCUTANEOUS
  Filled 2016-03-06 (×3): qty 0.3

## 2016-03-06 MED ORDER — JEVITY 1.2 CAL PO LIQD
1000.0000 mL | ORAL | Status: DC
  Administered 2016-03-06: 1000 mL
  Filled 2016-03-06 (×4): qty 1000

## 2016-03-06 MED ORDER — PRO-STAT SUGAR FREE PO LIQD
30.0000 mL | Freq: Three times a day (TID) | ORAL | Status: DC
  Administered 2016-03-06 – 2016-03-09 (×8): 30 mL
  Filled 2016-03-06 (×8): qty 30

## 2016-03-06 MED ORDER — FREE WATER
250.0000 mL | Status: DC
  Administered 2016-03-06: 250 mL

## 2016-03-06 MED ORDER — DEXTROSE 5 % IV SOLN: INTRAVENOUS | Status: DC

## 2016-03-06 MED ORDER — FREE WATER
250.0000 mL | Status: DC
  Administered 2016-03-06 – 2016-03-08 (×12): 250 mL

## 2016-03-06 MED ORDER — POTASSIUM CHLORIDE 20 MEQ/15ML (10%) PO SOLN
40.0000 meq | ORAL | Status: AC
  Administered 2016-03-06 (×2): 40 meq via ORAL
  Filled 2016-03-06 (×2): qty 30

## 2016-03-06 NOTE — Care Management Important Message (Signed)
Important Message  Patient Details  Name: [name] MRN: [id number] Date of Birth: 1941/07/21   Medicare Important Message Given:  Yes    Nathen May 03/06/2016, 10:44 AM

## 2016-03-06 NOTE — Progress Notes (Signed)
PULMONARY / CRITICAL CARE MEDICINE   Name: [name] MRN: [id number] DOB: 09/25/1940    ADMISSION DATE:  02/28/2016 CONSULTATION DATE:  03/01/16  REFERRING MD:  Dr. Leonie Man / Stroke MD   CHIEF COMPLAINT:  CVA, Acute Respiratory Failure   HISTORY OF PRESENT ILLNESS:  75 y/o M with PMH of DM, diabetic neuropathy, GERD, HLD, HTN, aortic stenosis who was admitted on 7/26 for right popliteal bypass graft in the setting of chronic right lower extremity wound.  On 7/26 she underwent a right below the knee popliteal artery to peroneal artery bypass. Postoperatively she was treated with heparin and transitioned to Plavix on 7/27. JP drain postoperatively had high output. She was evaluated by physical therapy and recommended for skilled nursing facility placement. Postoperatively there were concern for weak doppler flow. On 7/28, she was noted to have worsening of peroneal signal consistent with early failure and anemia requiring transfusion of 2 units PRBCs. Palliative amputation was recommended.  On re-rounding, the patient was noted to have altered mental status with right-sided weakness and left gaze. Code Stroke called for further evaluation.  Upon*team evaluation, she was noted to have incomprehensible speech and moaning as well as right hemiplegia. She was emergently taken to neuro-interventional radiology where she underwent procedure for revascularization (stent assisted angioplasty) of her left ICA in the setting of thrombus.  The patient was intubated and returned to ICU for further observation.  PCCM consulted for ICU assistance  SUBJECTIVE:  No distress.   VITAL SIGNS: BP (!) 154/48   Pulse 69   Temp 99.6 F (37.6 C) (Oral)   Resp 14   Ht 5\' 5"  (1.651 m)   Wt 248 lb 10.9 oz (112.8 kg)   SpO2 100%   BMI 41.38 kg/m   HEMODYNAMICS:    VENTILATOR SETTINGS:    INTAKE / OUTPUT:  Intake/Output Summary (Last 24 hours) at 03/06/16 0958 Last data filed at 03/06/16 0700  Gross per 24  hour  Intake             1340 ml  Output             3960 ml  Net            -2620 ml     PHYSICAL EXAMINATION:  General: obese 75 year old aaf, resting in the sitting position in bed. No distress.  Neuro: awake, speech unintelligible. Strength seems equally diminished. Follows commands.  HEENT: MMM, no JVD  PULM: clear, decreased bases. No accessory use  CV: rrr w/ systolic murmur III/VI GI: soft, not tender Tolerating tube feeds.  Extremities: edematous. Weak pulses. Surgical wound weeping on LLE   LABS:  BMET  Recent Labs Lab 03/04/16 1700 03/05/16 0600 03/06/16 0500  NA 148* 148* 154*  K 3.7 3.5 3.3*  CL 112* 113* 113*  CO2 27 31 36*  BUN 45* 44* 37*  CREATININE 0.94 0.88 0.75  GLUCOSE 221* 238* 187*    Electrolytes  Recent Labs Lab 03/04/16 0550 03/04/16 1700 03/05/16 0600 03/06/16 0500  CALCIUM 8.3* 8.3* 8.6* 9.0  MG 2.2  --  2.3 2.2  PHOS 2.6  --  2.5 2.4*    CBC  Recent Labs Lab 03/04/16 1752 03/05/16 0600 03/06/16 0500  WBC 13.5* 12.1* 13.3*  HGB 8.1* 7.8* 8.1*  HCT 26.1* 25.5* 27.0*  PLT 172 158 188    Coag's No results for input(s): APTT, INR in the last 168 hours.  Sepsis Markers  Recent Labs Lab 03/01/16  2030 03/01/16 2042 03/01/16 2350 03/02/16 0600 03/03/16 0512  LATICACIDVEN  --  1.9 1.9  --   --   PROCALCITON 0.32  --   --  0.41 0.28    ABG  Recent Labs Lab 03/01/16 1623 03/04/16 1322  PHART 7.379 7.539*  PCO2ART 42.6 32.3*  PO2ART 268.0* 142.0*    Liver Enzymes  Recent Labs Lab 03/04/16 1200 03/05/16 0600 03/06/16 0500  AST 19 15 17   ALT 26 24 28   ALKPHOS 55 51 62  BILITOT 0.5 0.3 0.3  ALBUMIN 2.2* 2.1* 2.2*    Cardiac Enzymes  Recent Labs Lab 03/04/16 1752 03/04/16 2330  TROPONINI 0.10* 0.11*    Glucose  Recent Labs Lab 03/05/16 1151 03/05/16 1521 03/05/16 1940 03/05/16 2339 03/06/16 0322 03/06/16 0759  GLUCAP 276* 195* 122* 149* 190* 154*    Imaging Dg Abd 1 View  Result  Date: 03/05/2016 CLINICAL DATA:  Feeding tube placement EXAM: ABDOMEN - 1 VIEW COMPARISON:  CT abdomen and pelvis 03/03/2016, abdominal radiograph 03/01/2016 FINDINGS: Feeding tube coiled at gastric antrum with tip at mid stomach. Enlargement of cardiac silhouette. Atelectasis versus consolidation LEFT lower lobe. No bowel dilatation identified. IMPRESSION: Feeding tube coiled at gastric antrum with tip at mid stomach. Atelectasis versus consolidation LEFT lower lobe. Enlargement of cardiac silhouette. Electronically Signed   By: Lavonia Dana M.D.   On: 03/05/2016 17:53     STUDIES:  CTA Head / Neck 7/28: At risk brain in the L MCA territory showing normal cerebral blood volume but decreased cerebral flow CT Cerebral Perfusion 7/28: near occlusion of the L ICA at the origin Abd X-ray 7/28: Enteric catheter with tip overlying the peripyloric region. Port CXR 7/28: Endotracheal tube in good position. Enteric feeding tube coursing below diaphragm. Right internal jugular central venous catheter in place. Low lung volumes. Bilateral basilar opacities. MRI/MRA of Brain 7/29: Acute patchy ischemic infarcts left basal ganglia & internal capsule with extension into periventricular white matter of left corona radiata/centrum semi-ovale. No hemorrhage or mass effect. Scattered small punctate cortical & white matter infarcts involving bilateral cerebral hemispheres & rites or bowel movement. No associated mass or hemorrhage. 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 No large vessel occlusion within intracranial circulation. Short segment severe stenosis left ICA. Severe stenosis proximal right PCA. Multifocal atheromatous irregularity involving anterior & posterior circulations without severe or correctable stenosis. TTE 7/29: Moderate concentric LVH. EF 55-60%. Grade 2 diastolic dysfunction. Cannot exclude wall motion abnormality. LA moderately dilated & RA normal in size. RV normal in size and  function. Mild aortic regurgitation without stenosis. No mitral regurgitation or stenosis. No pulmonic stenosis. No tricuspid regurgitation. Small circumferential pericardial effusion.  Port CXR 7/30: Enteric feeding tube coursing below diaphragm. Endotracheal tube in good position. Bilateral basilar opacities unchanged. Right internal jugular central venous catheter unchanged in position. 7/30 CT abdo - no bleed  MICROBIOLOGY: MRSA PCR 7/24:  Negative Tracheal Asp Ctx 7/29: few GNR GNC and GPC pairs>>>  ANTIBIOTICS: None  SIGNIFICANT EVENTS: 7/26 - Admit for R popliteal artery to peroneal artery bypass 7/28 - Code Stroke called for R sided weakness, to neuro IR with near occlusion of L ICA s/p revascularization     LINES/TUBES: OETT 7/28 >> 8/1 R IJ Dual Lumen 7/28 >>  Foley 7/26 >> L Fem Sheat 7/28 - 7/29 L Fem Art Line 7/28 - 7/29 R UE Midline IV 7/28 >>  ASSESSMENT / PLAN:  NEUROLOGIC A:   L ICA CVA - S/P Revascularization  [id number] to be embolic but source unclear. She is s/p Left ICA stent.   P:   Management per Neuro IR/Stroke Service ASA 325mg  daily Plavix 75mg  daily Lyrica VT q8hr Defer bubble study to neuro PT consult   PULMONARY A: Acute Hypoxic Respiratory Failure - Possible aspiration in setting of acute CVA. Extubated 8/1 -severe aspiration risk P:   Weaning FiO2 for Sat >92% Albuterol Neb prn NPO  CARDIOVASCULAR A:  H/O Hypertension  H/O Hyperlipidemia RLE Popliteal Artery to Peroneal Artery Bypass - Completed 7/26 H/O Peripheral Vascular Disease  P:  SBP goal per Neuro Continue Lipitor 20mg  daily, Coreg 25mg  bid, Cozaar 100mg  daily Hold given rising Na   RENAL A:   Resolving ATN Severe Hypernatremia w/ free water deficit of 5.6 liters  Hypokalemia  P:   Hold lasix Replace K; recheck in am.  Add gentle free water replacement   GASTROINTESTINAL A:   Obesity  Severe dysphagia  P:   NPO Cont Tube Feedings Senna VT  bid  HEMATOLOGIC A:   Anemia - S/P 2 units PRBC's 7/27 & 1u PRBC 7/28. Another 1u PRBC on 8/1 Thrombocytopenia - Mild. Likely consumption-->improved  P:  Trending cell counts daily w/ CBC SCD on LLE for DVT  ASA + Plavix per Neurology  Transfuse for Hgb <7.0 Repeat CBC in am   INFECTIOUS A:   No evidence of acute infection.  P:   Monitor fevers  ENDOCRINE A:   H/O DM - Hyperglycemia. A1c 8.1.   P:   Levemir increased 25 bid-->hold here Accu-Checks q4hr  SSI per Moderate Dose Algorithm Add TF coverage   FAMILY  - Updates: I updated daughter  - Inter-disciplinary family meet or Palliative Care meeting due by:  8/4  Can go to the SDU setting from our stand-point PCCM will sign off.    Erick Colace ACNP-BC [place] Pager # [phone] OR # [phone] if no answer     STAFF NOTE: I, Merrie Roof, MD FACP have personally reviewed patient's available data, including medical history, events of note, physical examination and test results as part of my evaluation. I have discussed with resident/NP and other care providers such as pharmacist, RN and RRT. In addition, I personally evaluated patient and elicited key findings of: awake, no distress, pcxr with atx likely , was neg 2.6 liters, some rise Na, add d5w low rate , no sig brain edema risk with free water, higher risk to decline from Na rise neurowise, antiplat needed, SLP fail, needs NGt and feeding, chem in am for na, dc lasix for now, can go out of icu, will sign off, call if needed   Lavon Paganini. Titus Mould, MD, [place] Pgr: [facility] 03/06/2016 11:27 AM

## 2016-03-06 NOTE — Evaluation (Signed)
Clinical/Bedside Swallow Evaluation Patient Details  Name: [name] MRN: [id number] Date of Birth: 1941-03-03  Today's Date: 03/06/2016 Time: SLP Start Time (ACUTE ONLY): 0856 SLP Stop Time (ACUTE ONLY): 0908 SLP Time Calculation (min) (ACUTE ONLY): 12 min  Past Medical History:  Past Medical History:  Diagnosis Date  . Aortic stenosis   . Complication of anesthesia    Hard to wake patient up after having anesthesia  . Diabetes mellitus without complication ([place])   . Diabetic neuropathy (HCC)    Takes Lyrica  . Edema of both legs    Takes Lasix  . GERD (gastroesophageal reflux disease)   . High cholesterol   . HTN (hypertension)   . Hypertension   . Shortness of breath dyspnea   . Spasm of back muscles   . Wound, open    Right great toe   Past Surgical History:  Past Surgical History:  Procedure Laterality Date  . BYPASS GRAFT POPLITEAL TO TIBIAL Right 02/28/2016   Procedure: BYPASS GRAFT RIGHT BELOW KNEE POPLITEAL TO PERONEAL USING REVERSED RIGHT GREATER SAPHENOUS VEIN;  Surgeon: Conrad Zwolle, MD;  Location: [place];  Service: Vascular;  Laterality: Right;  . CYST EXCISION     Abdomen  . EYE SURGERY Bilateral    Cataract removal  . INTRAMEDULLARY (IM) NAIL INTERTROCHANTERIC Left 02/04/2014   Procedure: INTRAMEDULLARY (IM) NAIL INTERTROCHANTRIC FEMORAL;  Surgeon: Mauri Pole, MD;  Location: [place];  Service: Orthopedics;  Laterality: Left;  . IR GENERIC HISTORICAL  03/01/2016   IR ANGIO INTRA EXTRACRAN SEL COM CAROTID INNOMINATE UNI R MOD SED 03/01/2016 [name], MD [facility]  . IR GENERIC HISTORICAL  03/01/2016   IR ENDOVASC INTRACRANIAL INF OTHER THAN THROMBO ART INC DIAG ANGIO 03/01/2016 [name], MD [facility]  . IR GENERIC HISTORICAL  03/01/2016   IR INTRAVSC STENT CERV CAROTID W/O EMB-PROT MOD SED INC ANGIO 03/01/2016 [name], MD [facility]  . ORIF TOE FRACTURE Right 02/08/2014   Procedure: OPEN REDUCTION INTERNAL FIXATION Right  METATARSAL  FRACTURE ;  Surgeon: Wylene Simmer, MD;  Location: [place];  Service: Orthopedics;  Laterality: Right;  . PERIPHERAL VASCULAR CATHETERIZATION N/A 12/28/2015   Procedure: Abdominal Aortogram w/Lower Extremity;  Surgeon: Conrad Sarpy, MD;  Location: [facility];  Service: Cardiovascular;  Laterality: N/A;  . RADIOLOGY WITH ANESTHESIA N/A 03/01/2016   Procedure: RADIOLOGY WITH ANESTHESIA;  Surgeon: [name], MD;  Location: [place];  Service: Radiology;  Laterality: N/A;  . VEIN HARVEST Right 02/28/2016   Procedure: RIGHT GREATER SAPHENOUS VEIN HARVEST;  Surgeon: Conrad , MD;  Location[facility];  Service: Vascular;  Laterality: Right;   HPI:  75 year old admitted for bypass graft popliteal to peroneal for foot ulcer. Pt developed post stroke symptoms and MRI revealed acute patchy ischemic infarcts involving left basal ganglia and internal capsule, scattered small punctate cortical and white matter infarcts involving the bilateral cerebral hemispheres and right cerebellum. Intubated 7/28-8/1. No prior ST documentation.    Assessment / Plan / Recommendation Clinical Impression  Pt seen for swallow assessment following 5 day intubation. Presently she is aphonic, follows commands 50-60% of the time with delayed processing. Attempted to but unable to cough or swallow on command. Delayed cough following cups sips water. Aspiration risk is high and recommend she continue tube feeds and ST will continue to follow for facilitation of swallow.     Aspiration Risk  Severe aspiration risk    Diet Recommendation NPO  Medication Administration: Via alternative means    Other  Recommendations Oral Care Recommendations: Oral care QID   Follow up Recommendations   (TBD)    Frequency and Duration min 2x/week  2 weeks       Prognosis Prognosis for Safe Diet Advancement: Good Barriers to Reach Goals: Cognitive deficits      Swallow Study   General HPI: 75 year old admitted for  bypass graft popliteal to peroneal for foot ulcer. Pt developed post stroke symptoms and MRI revealed acute patchy ischemic infarcts involving left basal ganglia and internal capsule, scattered small punctate cortical and white matter infarcts involving the bilateral cerebral hemispheres and right cerebellum. Intubated 7/28-8/1. No prior ST documentation.  Type of Study: Bedside Swallow Evaluation Previous Swallow Assessment:  (none) Diet Prior to this Study: NPO;NG Tube Temperature Spikes Noted: Yes Respiratory Status: Room air History of Recent Intubation: Yes Length of Intubations (days): 5 days Date extubated: 03/05/16 Behavior/Cognition: Alert;Cooperative;Requires cueing Oral Cavity Assessment: Within Functional Limits Oral Care Completed by SLP: Yes Oral Cavity - Dentition: Adequate natural dentition Vision: Functional for self-feeding Self-Feeding Abilities: Needs set up;Needs assist Patient Positioning: Upright in bed Baseline Vocal Quality:  (aphonic) Volitional Cough:  (attempted, unable) Volitional Swallow: Unable to elicit    Oral/Motor/Sensory Function Overall Oral Motor/Sensory Function: Generalized oral weakness   Ice Chips Ice chips: Impaired Presentation: Spoon Pharyngeal Phase Impairments: Multiple swallows   Thin Liquid Thin Liquid: Impaired Presentation: Spoon;Cup Pharyngeal  Phase Impairments: Multiple swallows;Suspected delayed Swallow;Cough - Delayed    Nectar Thick Nectar Thick Liquid: Not tested   Honey Thick Honey Thick Liquid: Not tested   Puree Puree: Impaired Pharyngeal Phase Impairments: Multiple swallows   Solid   GO   Solid: Not tested        Houston Siren 03/06/2016,9:25 AM  Orbie Pyo Colvin Caroli.Ed [employer] [phone]

## 2016-03-06 NOTE — Progress Notes (Signed)
STROKE TEAM PROGRESS NOTE   SUBJECTIVE (INTERVAL HISTORY) Patient more awake this am. Extubated yesterday. Daughter reports she asked for feed. ST saw, not ready for POs. Na elevated. Added free water. Has wheeze. CXR ordered. Still has CL. No need from stroke standpoint. Keep foley for I&o monitoring. Dr. Erlinda Hong discussed medical condition, daily and future plans of care with daughter, care team.   OBJECTIVE Temp:  [97.7 F (36.5 C)-99.6 F (37.6 C)] 99.6 F (37.6 C) (08/02 0800) Pulse Rate:  [66-80] 69 (08/02 0600) Cardiac Rhythm: Normal sinus rhythm (08/01 2000) Resp:  [13-28] 14 (08/02 0600) BP: (88-177)/(37-126) 154/48 (08/02 0600) SpO2:  [93 %-100 %] 100 % (08/02 0600) Weight:  [112.8 kg (248 lb 10.9 oz)] 112.8 kg (248 lb 10.9 oz) (08/02 0422)  CBC:   Recent Labs Lab 03/04/16 0550  03/05/16 0600 03/06/16 0500  WBC 10.4  < > 12.1* 13.3*  NEUTROABS 7.8*  --  9.5*  --   HGB 7.8*  < > 7.8* 8.1*  HCT 24.5*  < > 25.5* 27.0*  MCV 91.4  < > 92.7 93.8  PLT 147*  < > 158 188  < > = values in this interval not displayed.  Basic Metabolic Panel:   Recent Labs Lab 03/05/16 0600 03/06/16 0500  NA 148* 154*  K 3.5 3.3*  CL 113* 113*  CO2 31 36*  GLUCOSE 238* 187*  BUN 44* 37*  CREATININE 0.88 0.75  CALCIUM 8.6* 9.0  MG 2.3 2.2  PHOS 2.5 2.4*    Lipid Panel:     Component Value Date/Time   CHOL 109 03/02/2016 0600   TRIG 71 03/06/2016 0500   HDL 32 (L) 03/02/2016 0600   CHOLHDL 3.4 03/02/2016 0600   VLDL 24 03/02/2016 0600   LDLCALC 53 03/02/2016 0600   HgbA1c:  Lab Results  Component Value Date   HGBA1C 7.7 (H) 03/02/2016   Urine Drug Screen:     Component Value Date/Time   LABOPIA NONE DETECTED 02/12/2014 0110   COCAINSCRNUR NONE DETECTED 02/12/2014 0110   LABBENZ NONE DETECTED 02/12/2014 0110   AMPHETMU NONE DETECTED 02/12/2014 0110   THCU NONE DETECTED 02/12/2014 0110   LABBARB NONE DETECTED 02/12/2014 0110      IMAGING Dr. Erlinda Hong has personally  reviewed the radiological images below and agree with the radiology interpretations.  Ct Head Code Stroke Wo Contrast 03/01/2016 Age related volume loss with mild periventricular small vessel disease. No acute infarct evident currently. No edema or hemorrhage. No hyperdense vessel evident. Basal ganglia calcification is physiologic.   Ct Angio Head and Neck W Or Wo Contrast 03/01/2016 Near occlusion of the left ICA at the origin. Reduced caliber of the left cervical internal carotid artery because of reduced inflow. 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 Stenoses estimated at 50% bilaterally. Probable serial stenoses. No M1 stenosis and no missing M2 branches on the left.    Ct Cerebral Perfusion W Contrast 03/01/2016 At risk brain in the left MCA territory showing normal cerebral blood volume but decreased cerebral blood flow, mean transit time and time to peak. No evidence of completed infarction.   Cerebral Angiogram 03/01/2016 S./P bilateral  Common  Carotid arteriogram followed by by endovascular revascularization of  preocclusive lt ICA stenosis prox with a thrombus with stent assisted angioplasty and 4.5 mg of IA Integrelin TICI flow MCA distribution  Ct Head Wo Contrast 03/01/2016 No change or acute finding following left carotid artery stent.  MRI HEAD  03/02/2016 1. Acute patchy ischemic infarcts involving left basal ganglia and internal capsule, with extension into periventricular white matter of the left corona radiata/centrum semi ovale. No associated hemorrhage or mass effect.  2. Additional scattered small punctate cortical and white matter infarcts involving the bilateral cerebral hemispheres and right cerebellum as above. No associated hemorrhage or mass effect.  3. 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 No associated edema.   MRA HEAD 03/02/2016 1. No  large vessel occlusion within the intracranial circulation.  2. Short-segment severe stenosis at the supraclinoid left ICA.  3. Severe stenosis proximal right PCA with attenuated flow distally.  4. Additional multi focal atheromatous irregularity involving the anterior posterior circulations as above without severe or correctable stenosis.  5. Hypoplastic/absent left A1 segment, likely accounting for the diminutive left ICA is compared to the right.   Transthoracic echocardiogram 03/02/2016 - Left ventricle: The cavity size was normal. There was moderate concentric hypertrophy. Systolic function was normal. The estimated ejection fraction was in the range of 55% to 60%. Although no diagnostic regional wall motion abnormality was identified, this possibility cannot be completely excluded on the basis of this study. Features are consistent with a pseudonormal left ventricular filling pattern, with concomitant abnormal relaxation and increased filling pressure (grade 2 diastolic dysfunction). - Aortic valve: There was mild stenosis. Valve area (VTI): 1.28 cm^2. Valve area (Vmax): 1.18 cm^2. Valve area (Vmean): 1.04 cm^2. - Left atrium: The atrium was moderately dilated. - Pericardium, extracardiac: A small pericardial effusion was identified circumferential to the heart. Impressions: - There is an echo free space posterior the left ventricle that most likely represents a left pleural effusion, but image quality is poor. Cannot entirely exclude a loculated pericardial efusion. Correlate with radiological studies.  LE venous doppler - Bilateral:  No evidence of DVT, superficial thrombosis, or Baker's Cyst.  CXR 03/05/2016 Tube and catheter positions as described without pneumothorax. Evidence of underlying congestive heart failure. Bibasilar atelectasis.   PHYSICAL EXAM  Temp:  [97.7 F (36.5 C)-99.6 F (37.6 C)] 99.6 F (37.6 C) (08/02 0800) Pulse Rate:  [66-80] 69 (08/02 0600) Resp:  [13-28]  14 (08/02 0600) BP: (88-177)/(37-126) 154/48 (08/02 0600) SpO2:  [93 %-100 %] 100 % (08/02 0600) Weight:  [112.8 kg (248 lb 10.9 oz)] 112.8 kg (248 lb 10.9 oz) (08/02 0422)  General - Well nourished, well developed, sleepy but arousable.  Ophthalmologic - Fundi not visualized due to noncooperation.  Cardiovascular - Regular rate and rhythm.  Extremities - right foot proximal to big toe wound with dressing  Neuro - sleepy but arousable, eyes open on voice. Able to follow some central commands and peripheral command on the left UE. PERRL, mid position, able to move to the left but not on the right. Positive corneal and gag. Left facial droop. On pain stimulation, LUE purposeful movement, RUE trace withdraw to pain, BLE withdraw to pain. DTR 1+ and no babinski. Sensation, coordination and gait not tested.   ASSESSMENT/PLAN [name] is a 75 y.o. female with history of hypertension, hyperlipidemia, diabetes with diabetic neuropathy, aortic stenosis, and a nonhealing ischemic right foot ulcer status post right lower extremity bypass graft surgery performed 02/28/2016 who developed aphasia, right hemiplegia, and left-sided gaze preference in hopsital. She did not receive IV t-PA due to recent surgery. CT angio showed a near occlusion of the L ICA , perfusion showed brain at risk. She was taken to IR  where she received stent assisted angioplasty and IA Integrelin; a L ICA stent was placed.  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  Severe stenosis proximal right PCA.  CTA head and neck - near occlusion L ICA, R ICA 50%, intracranial  stenosis  2D Echo - EF 55-60%. No cardiac source of emboli identified.   LE venous doppler negative for DVT  Plan CTV pelvis to look for pelvic clot once more stabilized  Consider TEE/loop later if CTV pelvis unremarkable  LDL - 53  HgbA1c 8.1  VTE prophylaxis - SCDs Diet NPO time specified  aspirin 81 mg daily prior to admission, now on aspirin 325 mg daily and clopidogrel 75 mg daily.    Patient counseled to be compliant with her antithrombotic medications  Ongoing aggressive stroke risk factor management  Therapy recommendations: Pending  Disposition: Pending (lived at home, since fall 2 yrs ago wheelchair bound at baseline could stand for xfer, at [facility] in the past)  Acute Hypoxic Respiratory Failure  Lethargy at stroke onset  Intubated prior to procedure  Extubated 8/1 without difficulty  CCM managed  CXR showed b/l pleural effusion, repeat in am  Hypernatremia   Na 148->154  Likely due to lasix and diuresis   Add free water for gentle hydration  Continue TF   Anemia   received 2 unit PRBCs 7/27, 1u 7/28, 1u 7/30.   Hgb 6.6->8.2->7.8->8.1  Close monitoring  Continue DAPT for left ICA stent  CT abd - no source of bleeding  Fecal occult blood - negative  Hypertension  Blood pressure run somewhat low at times  BP goal normotensive  On coreg 25 bid, cozaar 100mg  daily, amlodipine 10mg    Off lasix  Hyperlipidemia  Home meds:  Lipitor 20 mg daily resumed in hospital  LDL 53, goal < 70  Continue statin at discharge  Diabetes  HgbA1c 8.1, goal < 7.0  Uncontrolled - glucoses running high  SSI  increase levemir to 30 bid  continue novolog 4u q4h with tube feeding  Appreciate DM education RN assistance   Other Stroke Risk Factors  Advanced age  Morbid Obesity, Body mass index is 41.38 kg/m., recommend weight loss, diet and exercise as appropriate   PVD - RLE popliteal artery to peroneal artery bypass 02/28/2016  Other  Active Problems  Hypernatremia. 148->154. BUN/Cr improved. Free water deficit calculated at 4.7L. Will replace with 251ml free water q4h x 24h. D/c NS-> NSL. Recheck in am  Audible wheeze and fluid retention in hands. On lasix 60 bid. Will check PCXR  Hypokalemia 3.3, will replace 40 q4 x 2. Recheck in am  Thrombocytopenia, resolved - 143 -> 188  Leukocytosis 15.2 -> 10.4->13.5->12.1->13.3  Improved BUN and creatinine - 1.28 -> 0.75  Right foot wound - VVS on board  Elevated troponin 0.1, EKG unchanged - T wave inversion lateral limb and precordial leads, not new, no change in management  Hospital day # 7    This patient is critically ill due to bilateral embolic stroke, left ICA stenosis s/p stent, VDRF, anemiaand at significant risk of neurological worsening, death form recurrent stroke, hemorrhagic transformation, in-stent restenosis, shock. This patient's care  requires constant monitoring of vital signs, hemodynamics, respiratory and cardiac monitoring, review of multiple databases, neurological assessment, discussion with family, other specialists and medical decision making of high complexity. I spent 80minutes of neurocritical care time in the care of this patient.  Rosalin Hawking, MD PhD Stroke Neurology 03/06/2016 3:50 PM   To contact Stroke Continuity provider, please refer to [email or web address] After hours, contact General Neurology

## 2016-03-06 NOTE — Evaluation (Signed)
Physical Therapy Evaluation Patient Details Name: [name] MRN: [id number] DOB: [birth year] Today's Date: 03/06/2016   History of Present Illness  75 year old admitted for bypass graft popliteal to peroneal for foot ulcer. Pt developed post stroke symptoms and MRI revealed acute patchy ischemic infarcts involving left basal ganglia and internal capsule, scattered small punctate cortical and white matter infarcts involving the bilateral cerebral hemispheres and right cerebellum. Intubated 7/28-8/1.   Clinical Impression  Patient seen for re-evaluation s/p CVA event. Patient now demonstrates deficits in functional mobility as indicated below. Will need continued skilled PT to address deficits and maixmize function. Will see as indicated and progress as tolerated. Recommend SNF upon acute discharge.    Follow Up Recommendations SNF;Supervision/Assistance - 24 hour;Supervision for mobility/OOB    Equipment Recommendations  None recommended by PT    Recommendations for Other Services       Precautions / Restrictions Precautions Precautions: Fall Restrictions Weight Bearing Restrictions: No      Mobility  Bed Mobility Overal bed mobility: Needs Assistance Bed Mobility: Supine to Sit     Supine to sit: Total assist;+2 for physical assistance     General bed mobility comments: Assist for LEs and trunk.  Transfers Overall transfer level: Needs assistance   Transfers: Sit to/from Stand;Stand Pivot Transfers Sit to Stand: Max assist;+2 physical assistance Stand pivot transfers: Max assist;+2 physical assistance       General transfer comment: Max assist +2 for sit to stand from EOB and stand pivot to chair.   Ambulation/Gait                Stairs            Wheelchair Mobility    Modified Rankin (Stroke Patients Only)       Balance Overall balance assessment: Needs assistance Sitting-balance support: Feet supported;Bilateral upper extremity  supported Sitting balance-Leahy Scale: Poor Sitting balance - Comments: Max assist for sitting balance. Postural control: Left lateral lean;Posterior lean Standing balance support: Bilateral upper extremity supported Standing balance-Leahy Scale: Zero                               Pertinent Vitals/Pain Pain Assessment: Faces Faces Pain Scale: Hurts little more Pain Intervention(s): Repositioned    Home Living Family/patient expects to be discharged to:: Skilled nursing facility                      Prior Function Level of Independence: Needs assistance   Gait / Transfers Assistance Needed: Amb with RW for short distances and w/c for longer distances  ADL's / Homemaking Assistance Needed: Husband assists pt with bathing, dressing, getting in/out of bed, and supervises all transfers  Comments: Information taken from prior eval.     Hand Dominance        Extremity/Trunk Assessment   Upper Extremity Assessment: RUE deficits/detail;LUE deficits/detail RUE Deficits / Details: No active movement noted. No withdrawl to pain. Increased edema in hands.   RUE Sensation: decreased light touch LUE Deficits / Details: Pt with some elbow flexion and limited shoulder flexion. Unable to hold against gravity. Pt demonstrated withdrawl to pain. Increased edema in hands.    Lower Extremity Assessment: Defer to PT evaluation      Cervical / Trunk Assessment: Kyphotic  Communication   Communication: Other (comment) (mostly non-verbal)  Cognition Arousal/Alertness: Awake/alert (Lethargic at end of session) Behavior During Therapy: Flat affect Overall Cognitive  Status: Impaired/Different from baseline Area of Impairment: Attention;Following commands   Current Attention Level: Focused   Following Commands: Follows one step commands inconsistently;Follows one step commands with increased time       General Comments: Pt nodding yes and no at times but unsure if  appropriate in response to questions.    General Comments      Exercises        Assessment/Plan    PT Assessment Patient needs continued PT services  PT Diagnosis Generalized weakness;Difficulty walking   PT Problem List Decreased strength;Decreased activity tolerance;Decreased balance;Decreased coordination;Decreased mobility;Decreased safety awareness  PT Treatment Interventions DME instruction;Gait training;Functional mobility training;Balance training;Therapeutic exercise;Therapeutic activities;Patient/family education   PT Goals (Current goals can be found in the Care Plan section) Acute Rehab PT Goals Patient Stated Goal: none stated PT Goal Formulation: Patient unable to participate in goal setting Time For Goal Achievement: 03/20/16 Potential to Achieve Goals: Fair    Frequency Min 3X/week   Barriers to discharge        Co-evaluation PT/OT/SLP Co-Evaluation/Treatment: Yes Reason for Co-Treatment: Complexity of the patient's impairments (multi-system involvement) PT goals addressed during session: Mobility/safety with mobility OT goals addressed during session: ADL's and self-care;Other (comment) (functional mobility)       End of Session Equipment Utilized During Treatment: Gait belt;Oxygen Activity Tolerance: Patient limited by fatigue;Patient limited by pain Patient left: in chair;with call bell/phone within reach;with chair alarm set Nurse Communication: Mobility status;Need for lift equipment         Time: [id number] PT Time Calculation (min) (ACUTE ONLY): 37 min   Charges:   PT Evaluation $PT Re-evaluation: 1 Procedure     PT G CodesDuncan Dull Mar 10, 2016, 6:27 PM  Alben Deeds, [place] DPT  [phone]

## 2016-03-06 NOTE — Progress Notes (Signed)
Nutrition Follow-up  DOCUMENTATION CODES:   Morbid obesity  INTERVENTION:   D/C Vital High Protein Jevity 1.2 @ 55 ml/hr 30 ml Prostat TID Provides: 1884 kcal, 118 grams protein, and 1069 ml H2O.  Total free water: 2269 ml   NUTRITION DIAGNOSIS:   Increased nutrient needs related to wound healing as evidenced by estimated needs. Ongoing.   GOAL:   Patient will meet greater than or equal to 90% of their needs Progressing.   MONITOR:   TF tolerance, Skin, I & O's, Labs  REASON FOR ASSESSMENT:   Ventilator    ASSESSMENT:   PMH of DM, diabetic neuropathy, GERD, HLD, HTN, aortic stenosis who was admitted on 7/26 for right popliteal bypass graft in the setting of chronic right lower extremity wound. 7/28 code stroke called, pt s/p L ICA revascularization.   8/1 extubated, Cortrak placed (tip in stomach per xray) Medications reviewed and include: ferrous sulfate, KCl, senokot 250 ml H2O every 4 hours = 1500 ml Labs reviewed: Na 154, K+ 3.3 CBG's: 154-236 Pt discussed during ICU rounds and with RN.   Diet Order:  Diet NPO time specified  Skin:  Wound (see comment) (R DM foot ulcer, R leg and hip incision)  Last BM:  8/2 loose  Height:   Ht Readings from Last 1 Encounters:  03/01/16 5\' 5"  (1.651 m)    Weight:   Wt Readings from Last 1 Encounters:  03/06/16 248 lb 10.9 oz (112.8 kg)    Ideal Body Weight:  56.8 kg  BMI:  Body mass index is 41.38 kg/m.  Estimated Nutritional Needs:   Kcal:  1850-2000  Protein:  105-125 grams  Fluid:  > 1.8 L/day  EDUCATION NEEDS:   No education needs identified at this time  [place], [place], [place] Pager [phone] After Hours Pager

## 2016-03-06 NOTE — Evaluation (Signed)
Occupational Therapy Re-Evaluation Patient Details Name: [name] MRN: [id number] DOB: 07/02/1941 Today's Date: 03/06/2016    History of Present Illness 75 year old admitted for bypass graft popliteal to peroneal for foot ulcer. Pt developed post stroke symptoms and MRI revealed acute patchy ischemic infarcts involving left basal ganglia and internal capsule, scattered small punctate cortical and white matter infarcts involving the bilateral cerebral hemispheres and right cerebellum. Intubated 7/28-8/1.    Clinical Impression   Pt currently requires max assist +2 for basic transfers and total assist for ADL. Pt able to participate in grooming activity sitting EOB with hand over hand assist. Pt presenting with impaired cognition, intermittently following one step commands, and bil UE weakness and decreased sensation (R>L) impacting her independence and safety with ADL and functional mobility. Recommending SNF for follow up to maximize independence and safety with ADL and functional mobility prior to return home. Pt would benefit from continued skilled OT to address established goals.     Follow Up Recommendations  SNF;Supervision/Assistance - 24 hour    Equipment Recommendations  Other (comment) (TBD at next venue)    Recommendations for Other Services       Precautions / Restrictions Precautions Precautions: Fall Restrictions Weight Bearing Restrictions: No      Mobility Bed Mobility Overal bed mobility: Needs Assistance Bed Mobility: Supine to Sit     Supine to sit: Total assist;+2 for physical assistance     General bed mobility comments: Assist for LEs and trunk.  Transfers Overall transfer level: Needs assistance   Transfers: Sit to/from Stand;Stand Pivot Transfers Sit to Stand: Max assist;+2 physical assistance Stand pivot transfers: Max assist;+2 physical assistance       General transfer comment: Max assist +2 for sit to stand from EOB and stand pivot to  chair.     Balance Overall balance assessment: Needs assistance Sitting-balance support: Feet supported;Bilateral upper extremity supported Sitting balance-Leahy Scale: Poor Sitting balance - Comments: Max assist for sitting balance. Postural control: Left lateral lean;Posterior lean Standing balance support: Bilateral upper extremity supported Standing balance-Leahy Scale: Zero                              ADL Overall ADL's : Needs assistance/impaired Eating/Feeding: NPO   Grooming: Wash/dry face;Maximal assistance;Sitting Grooming Details (indicate cue type and reason): Hand over hand assist to wipe mouth.                             Functional mobility during ADLs: Maximal assistance;+2 for physical assistance (for stand pivot only) General ADL Comments: Total assist for ADL at this time. Pt intermittenly following commands with nods of yes/no but unsure of accuracy of responses.     Vision Additional Comments: Difficult to assess due to impaired cognition.   Perception     Praxis      Pertinent Vitals/Pain Pain Assessment: Faces Faces Pain Scale: Hurts little more Pain Intervention(s): Repositioned     Hand Dominance     Extremity/Trunk Assessment Upper Extremity Assessment Upper Extremity Assessment: RUE deficits/detail;LUE deficits/detail RUE Deficits / Details: No active movement noted. No withdrawl to pain. Increased edema in hands. RUE Sensation: decreased light touch RUE Coordination: decreased fine motor;decreased gross motor LUE Deficits / Details: Pt with some elbow flexion and limited shoulder flexion. Unable to hold against gravity. Pt demonstrated withdrawl to pain. Increased edema in hands.    Lower  Extremity Assessment Lower Extremity Assessment: Defer to PT evaluation   Cervical / Trunk Assessment Cervical / Trunk Assessment: Kyphotic   Communication Communication Communication: Other (comment) (mostly non-verbal)    Cognition Arousal/Alertness: Awake/alert (Lethargic at end of session) Behavior During Therapy: Flat affect Overall Cognitive Status: Impaired/Different from baseline Area of Impairment: Attention;Following commands   Current Attention Level: Focused   Following Commands: Follows one step commands inconsistently;Follows one step commands with increased time       General Comments: Pt nodding yes and no at times but unsure if appropriate in response to questions.   General Comments       Exercises       Shoulder Instructions      Home Living Family/patient expects to be discharged to:: Skilled nursing facility                                        Prior Functioning/Environment Level of Independence: Needs assistance  Gait / Transfers Assistance Needed: Amb with RW for short distances and w/c for longer distances ADL's / Homemaking Assistance Needed: Husband assists pt with bathing, dressing, getting in/out of bed, and supervises all transfers   Comments: Information taken from prior eval.    OT Diagnosis: Generalized weakness;Cognitive deficits;Disturbance of vision;Acute pain;Hemiplegia dominant side   OT Problem List: Decreased strength;Decreased range of motion;Decreased activity tolerance;Impaired balance (sitting and/or standing);Impaired vision/perception;Decreased coordination;Decreased cognition;Decreased safety awareness;Decreased knowledge of use of DME or AE;Decreased knowledge of precautions;Impaired sensation;Obesity;Impaired UE functional use;Pain;Increased edema   OT Treatment/Interventions: Self-care/ADL training;Therapeutic exercise;Neuromuscular education;Energy conservation;DME and/or AE instruction;Therapeutic activities;Cognitive remediation/compensation;Visual/perceptual remediation/compensation;Patient/family education;Balance training    OT Goals(Current goals can be found in the care plan section) Acute Rehab OT Goals Patient Stated  Goal: none stated OT Goal Formulation: Patient unable to participate in goal setting Time For Goal Achievement: 03/20/16 Potential to Achieve Goals: Fair ADL Goals Pt Will Perform Grooming: with min assist;sitting Pt Will Perform Upper Body Bathing: with mod assist;sitting Pt Will Perform Lower Body Bathing: with max assist;sit to/from stand Pt Will Transfer to Toilet: with mod assist;bedside commode;stand pivot transfer Pt Will Perform Toileting - Clothing Manipulation and hygiene: with mod assist;sit to/from stand  OT Frequency: Min 2X/week   Barriers to D/C:            Co-evaluation PT/OT/SLP Co-Evaluation/Treatment: Yes     OT goals addressed during session: ADL's and self-care;Other (comment) (functional mobility)      End of Session Equipment Utilized During Treatment: Gait belt;Oxygen Nurse Communication: Mobility status;Need for lift equipment  Activity Tolerance: Other (comment) (limited by cognition) Patient left: in chair;with call bell/phone within reach;with chair alarm set   Time: [id number] OT Time Calculation (min): 35 min Charges:  OT General Charges $OT Visit: 1 Procedure OT Evaluation $OT Re-eval: 1 Procedure G-Codes:     Binnie Kand M.S., OTR/L Pager: [phone]  03/06/2016, 6:10 PM

## 2016-03-06 NOTE — Progress Notes (Addendum)
  Progress Note    03/06/2016 8:27 AM 5 Days Post-Op  Subjective:  Extubated.   afebrile  Vitals:   03/06/16 0500 03/06/16 0600  BP: (!) 176/49 (!) 154/48  Pulse: 75 69  Resp: 17 14  Temp:      Physical Exam: Lungs:  Non labored Incisions:  Distal incision with serous drainage of the bandage with slight separation, otherwise healing.  Right foot wound with mild granulation tissue and mild fibrinous exudate present. Extremities:  Right doppler signal right PT and left DP/PT   CBC    Component Value Date/Time   WBC 13.3 (H) 03/06/2016 0500   RBC 2.88 (L) 03/06/2016 0500   HGB 8.1 (L) 03/06/2016 0500   HCT 27.0 (L) 03/06/2016 0500   PLT 188 03/06/2016 0500   MCV 93.8 03/06/2016 0500   MCH 28.1 03/06/2016 0500   MCHC 30.0 03/06/2016 0500   RDW 17.0 (H) 03/06/2016 0500   LYMPHSABS 1.0 03/05/2016 0600   MONOABS 1.4 (H) 03/05/2016 0600   EOSABS 0.1 03/05/2016 0600   BASOSABS 0.0 03/05/2016 0600    BMET    Component Value Date/Time   NA 154 (H) 03/06/2016 0500   K 3.3 (L) 03/06/2016 0500   CL 113 (H) 03/06/2016 0500   CO2 36 (H) 03/06/2016 0500   GLUCOSE 187 (H) 03/06/2016 0500   BUN 37 (H) 03/06/2016 0500   CREATININE 0.75 03/06/2016 0500   CALCIUM 9.0 03/06/2016 0500   GFRNONAA >60 03/06/2016 0500   GFRAA >60 03/06/2016 0500    INR    Component Value Date/Time   INR 1.20 02/26/2016 1104     Intake/Output Summary (Last 24 hours) at 03/06/16 0827 Last data filed at 03/06/16 0700  Gross per 24 hour  Intake             1395 ml  Output             4210 ml  Net            -2815 ml     Assessment:  75 y.o. female is s/p:  1. Right below-the-knee popliteal artery to peroneal artery bypass with reversed greater saphenous vein            2.   Endarterectomy of mid-segment peroneal artery  5 Days Post-Op  Plan: -pt's foot wound with mild granulation tissue and some fibrinous tissue.  She does have doppler signals present in both feet. -pt extubated-she  is moving her right side a little more on command.  Hopefully this will continue to improve. -continue current wound care.    Leontine Locket, PA-C [facility] [phone] 03/06/2016 8:27 AM   Addendum  I have independently interviewed and examined the patient, and I agree with the physician assistant's findings.  Pt blinks eye to respond to yes and no question.  Unable to speech currently.  Moving Right arm more than foot this AM.  Right foot viable this AM with improved wound.  Appreciate Neuro, NeuroIR,  and PCCM assistance with this patient.  Adele Barthel, MD [facility] [place] Office: [phone] Pager: [phone]  03/06/2016, 10:18 AM

## 2016-03-07 ENCOUNTER — Inpatient Hospital Stay (HOSPITAL_COMMUNITY): Payer: Medicare HMO

## 2016-03-07 DIAGNOSIS — R1314 Dysphagia, pharyngoesophageal phase: Secondary | ICD-10-CM

## 2016-03-07 DIAGNOSIS — I6522 Occlusion and stenosis of left carotid artery: Secondary | ICD-10-CM

## 2016-03-07 LAB — COMPREHENSIVE METABOLIC PANEL
ALBUMIN: 2.3 g/dL — AB (ref 3.5–5.0)
ALT: 30 U/L (ref 14–54)
ANION GAP: 4 — AB (ref 5–15)
AST: 17 U/L (ref 15–41)
Alkaline Phosphatase: 52 U/L (ref 38–126)
BUN: 33 mg/dL — ABNORMAL HIGH (ref 6–20)
CHLORIDE: 115 mmol/L — AB (ref 101–111)
CO2: 35 mmol/L — AB (ref 22–32)
Calcium: 9.2 mg/dL (ref 8.9–10.3)
Creatinine, Ser: 0.68 mg/dL (ref 0.44–1.00)
GFR calc Af Amer: >60 mL/min (ref >60)
GFR calc non Af Amer: >60 mL/min (ref >60)
GLUCOSE: 215 mg/dL — AB (ref 65–99)
POTASSIUM: 4.2 mmol/L (ref 3.5–5.1)
SODIUM: 154 mmol/L — AB (ref 135–145)
Total Bilirubin: 0.4 mg/dL (ref 0.3–1.2)
Total Protein: 5.5 g/dL — ABNORMAL LOW (ref 6.5–8.1)

## 2016-03-07 LAB — CBC
HEMATOCRIT: 28.6 % — AB (ref 36.0–46.0)
HEMOGLOBIN: 8.4 g/dL — AB (ref 12.0–15.0)
MCH: 28.1 pg (ref 26.0–34.0)
MCHC: 29.4 g/dL — AB (ref 30.0–36.0)
MCV: 95.7 fL (ref 78.0–100.0)
Platelets: 225 10*3/uL (ref 150–400)
RBC: 2.99 MIL/uL — ABNORMAL LOW (ref 3.87–5.11)
RDW: 17.1 % — ABNORMAL HIGH (ref 11.5–15.5)
WBC: 14.6 10*3/uL — ABNORMAL HIGH (ref 4.0–10.5)

## 2016-03-07 LAB — GLUCOSE, CAPILLARY
GLUCOSE-CAPILLARY: 202 mg/dL — AB (ref 65–99)
GLUCOSE-CAPILLARY: 158 mg/dL — AB (ref 65–99)
GLUCOSE-CAPILLARY: 151 mg/dL — AB (ref 65–99)
Glucose-Capillary: 116 mg/dL — ABNORMAL HIGH (ref 65–99)
Glucose-Capillary: 184 mg/dL — ABNORMAL HIGH (ref 65–99)
Glucose-Capillary: 185 mg/dL — ABNORMAL HIGH (ref 65–99)

## 2016-03-07 MED ORDER — WHITE PETROLATUM GEL
Status: AC
  Administered 2016-03-07: 09:00:00
  Filled 2016-03-07: qty 1

## 2016-03-07 MED ORDER — LABETALOL HCL 5 MG/ML IV SOLN
10.0000 mg | INTRAVENOUS | Status: DC | PRN
  Administered 2016-03-08 (×10): 10 mg via INTRAVENOUS
  Filled 2016-03-07 (×6): qty 4

## 2016-03-07 MED ORDER — INSULIN DETEMIR 100 UNIT/ML ~~LOC~~ SOLN
35.0000 [IU] | Freq: Two times a day (BID) | SUBCUTANEOUS | Status: DC
  Administered 2016-03-07: 35 [IU] via SUBCUTANEOUS
  Filled 2016-03-07 (×3): qty 0.35

## 2016-03-07 NOTE — Evaluation (Signed)
Speech Language Pathology Evaluation Patient Details Name: [name] MRN: [id number] DOB: 1940/08/23 Today's Date: 03/07/2016 Time: [id number] SLP Time Calculation (min) (ACUTE ONLY): 13 min  Problem List:  Patient Active Problem List   Diagnosis Date Noted  . Right sided weakness   . Acute respiratory acidosis   . Surgery, elective   . Anemia, iron deficiency   . Leukocytosis   . Cerebrovascular accident (CVA) due to thrombosis of precerebral artery ([place]) 03/01/2016  . 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 Acute respiratory failure ([place])   . PAD (peripheral artery disease) ([place]) 02/28/2016  . Atherosclerosis of native arteries of the extremities with ulceration ([place]) 12/08/2015  . Open toe wound 05/04/2014  . Anemia, chronic disease 04/27/2014  . Occult blood positive stool 04/17/2014  . Edema 04/03/2014  . Unspecified hereditary and idiopathic peripheral neuropathy 03/14/2014  . Acute and chronic respiratory failure 02/14/2014  . Type II or unspecified type diabetes mellitus with neurological manifestations, uncontrolled 02/14/2014  . Respiratory failure ([place]) 02/12/2014  . Altered mental status 02/12/2014  . Acute encephalopathy 02/12/2014  . Acute respiratory failure with hypercapnia ([place]) 02/12/2014  . Acute diastolic CHF (congestive heart failure) ([place]) 02/12/2014  . Aortic stenosis 02/04/2014  . Closed left subtrochanteric femur fracture ([place]) 02/03/2014  . Hip fracture, left ([place]) 02/03/2014  . Hypokalemia 02/03/2014  . Fibula fracture 02/03/2014  . MTP instability 02/03/2014  . Hip fracture ([place]) 02/03/2014  . HTN (hypertension)   . Diabetes mellitus without complication ([place])   . High cholesterol    Past Medical History:  Past Medical History:  Diagnosis Date  . Aortic stenosis   . Complication of anesthesia    Hard to wake patient up after having anesthesia  . Diabetes mellitus without complication ([place])   .  Diabetic neuropathy ([facility])    Takes Lyrica  . Edema of both legs    Takes Lasix  . GERD (gastroesophageal reflux disease)   . High cholesterol   . HTN (hypertension)   . Hypertension   . Shortness of breath dyspnea   . Spasm of back muscles   . Wound, open    Right great toe   Past Surgical History:  Past Surgical History:  Procedure Laterality Date  . BYPASS GRAFT POPLITEAL TO TIBIAL Right 02/28/2016   Procedure: BYPASS GRAFT RIGHT BELOW KNEE POPLITEAL TO PERONEAL USING REVERSED RIGHT GREATER SAPHENOUS VEIN;  Surgeon: Conrad Ziebach, MD;  Location: [facility];  Service: Vascular;  Laterality: Right;  . CYST EXCISION     Abdomen  . EYE SURGERY Bilateral    Cataract removal  . INTRAMEDULLARY (IM) NAIL INTERTROCHANTERIC Left 02/04/2014   Procedure: INTRAMEDULLARY (IM) NAIL INTERTROCHANTRIC FEMORAL;  Surgeon: Mauri Pole, MD;  Location: [facility];  Service: Orthopedics;  Laterality: Left;  . IR GENERIC HISTORICAL  03/01/2016   IR ANGIO INTRA EXTRACRAN SEL COM CAROTID INNOMINATE UNI R MOD SED 03/01/2016 [name], MD [facility]  . IR GENERIC HISTORICAL  03/01/2016   IR ENDOVASC INTRACRANIAL INF OTHER THAN THROMBO ART INC DIAG ANGIO 03/01/2016 [name], MD [facility]  . IR GENERIC HISTORICAL  03/01/2016   IR INTRAVSC STENT CERV CAROTID W/O EMB-PROT MOD SED INC ANGIO 03/01/2016 [name], MD [facility]  . ORIF TOE FRACTURE Right 02/08/2014   Procedure: OPEN REDUCTION INTERNAL FIXATION Right METATARSAL  FRACTURE ;  Surgeon: Wylene Simmer, MD;  Location: [facility];  Service: Orthopedics;  Laterality: Right;  . PERIPHERAL  VASCULAR CATHETERIZATION N/A 12/28/2015   Procedure: Abdominal Aortogram w/Lower Extremity;  Surgeon: Conrad Wellston, MD;  Location: [facility];  Service: Cardiovascular;  Laterality: N/A;  . RADIOLOGY WITH ANESTHESIA N/A 03/01/2016   Procedure: RADIOLOGY WITH ANESTHESIA;  Surgeon: [name], MD;  Location: [facility];  Service: Radiology;  Laterality: N/A;   . VEIN HARVEST Right 02/28/2016   Procedure: RIGHT GREATER SAPHENOUS VEIN HARVEST;  Surgeon: Conrad Magnolia, MD;  Location[facility];  Service: Vascular;  Laterality: Right;   HPI:  75 year old admitted for bypass graft popliteal to peroneal for foot ulcer. Pt developed post stroke symptoms and MRI revealed acute patchy ischemic infarcts involving left basal ganglia and internal capsule, scattered small punctate cortical and white matter infarcts involving the bilateral cerebral hemispheres and right cerebellum. Intubated 7/28-8/1. No prior ST documentation.    Assessment / Plan / Recommendation Clinical Impression  Mrs. Cannell demontrates moderate cognitive impairments in the areas of attention, memory, problem solving, orientation, awareness and speech intelligibility. Cognition baseline prior to admission; appears to have good family support. Rehabilitation will likely be longer term and pt would benefit from SNF level therapy at present. ST will facilitate speech and cognition while in acute care.      SLP Assessment  Patient needs continued Speech Lanaguage Pathology Services    Follow Up Recommendations  Skilled Nursing facility    Frequency and Duration min 2x/week  2 weeks      SLP Evaluation Prior Functioning  Cognitive/Linguistic Baseline: Within functional limits Type of Home: House  Lives With: Spouse   Cognition  Overall Cognitive Status: Impaired/Different from baseline Arousal/Alertness:  (awake, drowsy intemittently requiring cueing) Orientation Level: Oriented to person;Oriented to place;Disoriented to situation (oriented to year) Attention: Sustained Sustained Attention: Impaired Sustained Attention Impairment: Verbal basic Memory: Impaired Awareness: Impaired Awareness Impairment: Intellectual impairment;Emergent impairment;Anticipatory impairment Problem Solving: Impaired Problem Solving Impairment: Functional basic Executive Function: Self Correcting;Self  Monitoring Safety/Judgment: Impaired    Comprehension  Auditory Comprehension Overall Auditory Comprehension: Appears within functional limits for tasks assessed Interfering Components: Attention;Processing speed;Working Field seismologist: Tour manager: Not tested Reading Comprehension Reading Status:  (TBA)    Expression Expression Primary Mode of Expression: Verbal Verbal Expression Overall Verbal Expression: Appears within functional limits for tasks assessed Initiation: No impairment Level of Generative/Spontaneous Verbalization: Phrase Repetition:  (NT) Naming:  (NT/TBA) Pragmatics: Impairment Impairments: Abnormal affect;Eye contact Interfering Components: Attention Written Expression Dominant Hand: Right Written Expression: Not tested (unable to grip pen)   Oral / Motor  Oral Motor/Sensory Function Overall Oral Motor/Sensory Function: Generalized oral weakness Motor Speech Overall Motor Speech: Impaired Respiration: Impaired Level of Impairment: Sentence Phonation: Low vocal intensity Resonance: Within functional limits Articulation: Within functional limitis Intelligibility: Intelligibility reduced Word: 75-100% accurate Phrase: 75-100% accurate Sentence: 50-74% accurate Motor Planning: Witnin functional limits   GO                    Houston Siren 03/07/2016, 10:01 AM   Orbie Pyo Colvin Caroli.Ed [employer] [phone]

## 2016-03-07 NOTE — Progress Notes (Signed)
   MBSS complete. Full report located under chart review in imaging section.  CHL IP CLINICAL IMPRESSIONS 03/07/2016  Therapy Diagnosis Moderate oral phase dysphagia;Mild pharyngeal phase dysphagia  Clinical Impression Pt demonstrated moderate oral phase dysphagia with bilateral labial leakage primarily with thin, delayed and inadequate mastication of regular solid. Straw sips thin increased volume and velocity and resulted in laryngeal penetration (mild & silent). Delayed swallow initiation to pyriform sinuses due to decreased sensation. Cognitive impairments increase aspiration risk. She needs diligent supervision and feeding assist (significant bilateral arm and hand weakness) for small sips via cup, no straws, crush meds and sit upright. ST will continue to follow for safety.     Impact on safety and function (No Data)    Cranford Mon.Ed [employer] [phone]

## 2016-03-07 NOTE — Progress Notes (Signed)
   Daily Progress Note  Assessment/Planning: POD #8s/p R pop to peroneal bypass with ips rGSV,  POD #6 s/p L ICA stenting by NeuroIR, B CVA, Ansarca, severe protein malnutrition, severe supraclinoid L ICA stenosis   Continued incremental neuro recovery  PT/OT/ST evaluation to determine degree of rehab potential  Right foot wound remain clean with evidence of additional granulation today  Continue Santyl to right foot  Appreciate neuro's mgmt of this patient  Subjective  - 6 Days Post-Op  More aware, improved cognitive function  Objective Vitals:   03/07/16 0500 03/07/16 0800 03/07/16 1000 03/07/16 1100  BP: (!) 175/64  (!) 154/44 (!) 148/47  Pulse: 79  75 72  Resp: 19  (!) 21 17  Temp:  98.3 F (36.8 C)    TempSrc:  Axillary    SpO2: 100%   100%  Weight:      Height:        Intake/Output Summary (Last 24 hours) at 03/07/16 1201 Last data filed at 03/07/16 0500  Gross per 24 hour  Intake          1348.58 ml  Output                0 ml  Net          1348.58 ml    PULM  BLL rales CV  RRR GI  soft, NTND VASC  R 1st MT wound: cleaner, some new granulation, dopplerable peroneal -> DP signal  Laboratory CBC    Component Value Date/Time   WBC 14.6 (H) 03/07/2016 0422   HGB 8.4 (L) 03/07/2016 0422   HCT 28.6 (L) 03/07/2016 0422   PLT 225 03/07/2016 0422    BMET    Component Value Date/Time   NA 154 (H) 03/07/2016 0422   K 4.2 03/07/2016 0422   CL 115 (H) 03/07/2016 0422   CO2 35 (H) 03/07/2016 0422   GLUCOSE 215 (H) 03/07/2016 0422   BUN 33 (H) 03/07/2016 0422   CREATININE 0.68 03/07/2016 0422   CALCIUM 9.2 03/07/2016 0422   GFRNONAA >60 03/07/2016 0422   GFRAA >60 03/07/2016 0422    Adele Barthel, MD [facility] [place] Office: [phone] Pager: [phone]  03/07/2016, 12:01 PM

## 2016-03-07 NOTE — Clinical Social Work Note (Signed)
Clinical Social Worker continuing to follow patient and family for support and discharge planning needs.  Patient still with [facility] and plans to do MBS to further evaluate swallowing.  CSW to explore bed offers with patient and family once dietary/swallowing concerns have been resolved.  CSW remains available for support and to facilitate patient discharge needs once medically stable.  Barbette Or, [place]

## 2016-03-07 NOTE — Progress Notes (Signed)
STROKE TEAM PROGRESS NOTE   SUBJECTIVE (INTERVAL HISTORY) Daughter at bedside. Patient more awake, talkative. Has mittens on and says she wants them off and that she wants something to drink. Her son arrived during rounds - she recognized him and smiled, called him by name and spoke with him. Less respiratory distress than yesterday but CXR still showing pleural effusion worse than yesterday. Na still high at 154, on TF and free water. Lasix stopped.    OBJECTIVE Temp:  [98.2 F (36.8 C)-100.1 F (37.8 C)] 98.6 F (37 C) (08/03 0400) Pulse Rate:  [72-81] 79 (08/03 0500) Resp:  [16-24] 19 (08/03 0500) BP: (131-175)/(39-127) 175/64 (08/03 0500) SpO2:  [100 %] 100 % (08/03 0500) Weight:  [114.2 kg (251 lb 12.3 oz)] 114.2 kg (251 lb 12.3 oz) (08/03 0416)  CBC:   Recent Labs Lab 03/04/16 0550  03/05/16 0600 03/06/16 0500 03/07/16 0422  WBC 10.4  < > 12.1* 13.3* 14.6*  NEUTROABS 7.8*  --  9.5*  --   --   HGB 7.8*  < > 7.8* 8.1* 8.4*  HCT 24.5*  < > 25.5* 27.0* 28.6*  MCV 91.4  < > 92.7 93.8 95.7  PLT 147*  < > 158 188 225  < > = values in this interval not displayed.  Basic Metabolic Panel:   Recent Labs Lab 03/05/16 0600 03/06/16 0500 03/07/16 0422  NA 148* 154* 154*  K 3.5 3.3* 4.2  CL 113* 113* 115*  CO2 31 36* 35*  GLUCOSE 238* 187* 215*  BUN 44* 37* 33*  CREATININE 0.88 0.75 0.68  CALCIUM 8.6* 9.0 9.2  MG 2.3 2.2  --   PHOS 2.5 2.4*  --     Lipid Panel:     Component Value Date/Time   CHOL 109 03/02/2016 0600   TRIG 71 03/06/2016 0500   HDL 32 (L) 03/02/2016 0600   CHOLHDL 3.4 03/02/2016 0600   VLDL 24 03/02/2016 0600   LDLCALC 53 03/02/2016 0600   HgbA1c:  Lab Results  Component Value Date   HGBA1C 7.7 (H) 03/02/2016   Urine Drug Screen:     Component Value Date/Time   LABOPIA NONE DETECTED 02/12/2014 0110   COCAINSCRNUR NONE DETECTED 02/12/2014 0110   LABBENZ NONE DETECTED 02/12/2014 0110   AMPHETMU NONE DETECTED 02/12/2014 0110   THCU NONE  DETECTED 02/12/2014 0110   LABBARB NONE DETECTED 02/12/2014 0110      IMAGING Dr. Erlinda Hong has personally reviewed the radiological images below and agree with the radiology interpretations.  Ct Head Code Stroke Wo Contrast 03/01/2016 Age related volume loss with mild periventricular small vessel disease. No acute infarct evident currently. No edema or hemorrhage. No hyperdense vessel evident. Basal ganglia calcification is physiologic.   Ct Angio Head and Neck W Or Wo Contrast 03/01/2016 Near occlusion of the left ICA at the origin. Reduced caliber of the left cervical internal carotid artery because of reduced inflow. 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 Stenoses estimated at 50% bilaterally. Probable serial stenoses. No M1 stenosis and no missing M2 branches on the left.    Ct Cerebral Perfusion W Contrast 03/01/2016 At risk brain in the left MCA territory showing normal cerebral blood volume but decreased cerebral blood flow, mean transit time and time to peak. No evidence of completed infarction.   Cerebral Angiogram 03/01/2016 S./P bilateral  Common  Carotid arteriogram followed by by endovascular revascularization of  preocclusive lt ICA stenosis prox with a thrombus with  stent assisted angioplasty and 4.5 mg of IA Integrelin TICI flow MCA distribution  Ct Head Wo Contrast 03/01/2016 No change or acute finding following left carotid artery stent.   MRI HEAD  03/02/2016 1. Acute patchy ischemic infarcts involving left basal ganglia and internal capsule, with extension into periventricular white matter of the left corona radiata/centrum semi ovale. No associated hemorrhage or mass effect.  2. Additional scattered small punctate cortical and white matter infarcts involving the bilateral cerebral hemispheres and right cerebellum as above. No associated hemorrhage or mass effect.  3. 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 No associated edema.   MRA HEAD 03/02/2016 1. No large vessel occlusion within the intracranial circulation.  2. Short-segment severe stenosis at the supraclinoid left ICA.  3. Severe stenosis proximal right PCA with attenuated flow distally.  4. Additional multi focal atheromatous irregularity involving the anterior posterior circulations as above without severe or correctable stenosis.  5. Hypoplastic/absent left A1 segment, likely accounting for the diminutive left ICA is compared to the right.   Transthoracic echocardiogram 03/02/2016 - Left ventricle: The cavity size was normal. There was moderate concentric hypertrophy. Systolic function was normal. The estimated ejection fraction was in the range of 55% to 60%. Although no diagnostic regional wall motion abnormality was identified, this possibility cannot be completely excluded on the basis of this study. Features are consistent with a pseudonormal left ventricular filling pattern, with concomitant abnormal relaxation and increased filling pressure (grade 2 diastolic dysfunction). - Aortic valve: There was mild stenosis. Valve area (VTI): 1.28 cm^2. Valve area (Vmax): 1.18 cm^2. Valve area (Vmean): 1.04 cm^2. - Left atrium: The atrium was moderately dilated. - Pericardium, extracardiac: A small pericardial effusion was identified circumferential to the heart. Impressions: - There is an echo free space posterior the left ventricle that most likely represents a left pleural effusion, but image quality is poor. Cannot entirely exclude a loculated pericardial efusion. Correlate with radiological studies.  LE venous doppler - Bilateral:  No evidence of DVT, superficial thrombosis, or Baker's Cyst.  CXR 03/07/2016 Bilateral effusions and lower lung volume loss, slowly worsening over the last week.   PHYSICAL EXAM  Temp:  [98.2 F (36.8 C)-100.1 F (37.8 C)] 98.6 F (37 C) (08/03  0400) Pulse Rate:  [72-81] 79 (08/03 0500) Resp:  [16-24] 19 (08/03 0500) BP: (131-175)/(39-127) 175/64 (08/03 0500) SpO2:  [100 %] 100 % (08/03 0500) Weight:  [114.2 kg (251 lb 12.3 oz)] 114.2 kg (251 lb 12.3 oz) (08/03 0416)  General - Well nourished, well developed, mild respiratory distress.  Ophthalmologic - Fundi not visualized due to noncooperation.  Cardiovascular - Regular rate and rhythm.  Extremities - right foot proximal to big toe wound with dressing  Neuro - awake alert, eyes open on voice, not answer questions for orientation but recognized her son by name. Able to follow most commands. PERRL, mid position, right side neglect, left eye gaze preference but able to cross midline to the right. Left facial droop, tongue in middle. On pain stimulation, LUE purposeful movement, RUE trace withdraw to pain, BLE withdraw to pain, L>R. DTR 1+ and no babinski. Sensation and coordination not cooperative, gait not tested.   ASSESSMENT/PLAN Ms. KATY GROSSO is a 75 y.o. female with history of hypertension, hyperlipidemia, diabetes with diabetic neuropathy, aortic stenosis, and a nonhealing ischemic right foot ulcer status post right lower extremity bypass graft surgery performed 02/28/2016 who developed aphasia, right hemiplegia, and left-sided  gaze preference in hopsital. She did not receive IV t-PA due to recent surgery. CT angio showed a near occlusion of the L ICA , perfusion showed brain at risk. She was taken to IR where she received stent assisted angioplasty and IA Integrelin; a L ICA stent was placed.  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  Severe stenosis proximal right PCA.  CTA head and neck - near occlusion L ICA, R ICA 50%, intracranial stenosis  Cerebral angio - left ICA clot s/p ICA stent  2D Echo - EF 55-60%. No cardiac source of emboli identified.   LE venous doppler negative for DVT  Plan CTV pelvis to look for pelvic clot once more stabilized  Consider TEE/loop later if CTV pelvis unremarkable  LDL - 53  HgbA1c 8.1  VTE prophylaxis - SCDs Diet NPO time specified. Has tube feedings and CoreTrack. ST to reassess swallow today -> MBS  aspirin 81 mg daily prior to admission, now on aspirin 325 mg daily and clopidogrel 75 mg daily   Patient/family counseled to be compliant with her antithrombotic medications  Ongoing aggressive stroke risk factor management  Therapy recommendations: SNF  Disposition: Pending (lived at home, since fall 2 yrs ago wheelchair bound at baseline could stand for xfer, at [facility] in the past)  Acute Hypoxic Respiratory Failure  Lethargy at stroke onset  Intubated prior to procedure  Extubated 8/1 without difficulty  CCM managed  CXR today - continued b/l pleural effusion, gradual worsening over the past 1 week   Hypernatremia   Na 148->154->154  Likely due to lasix and diuresis   Add free water for gentle hydration  Continue TF   Lasix held, K replaced by CCM  Anemia   received 2 unit PRBCs 7/27, 1u 7/28, 1u 7/30.   Hgb 6.6->8.2->7.8->8.1->8.4  Close monitoring  Continue DAPT for left ICA stent  CT abd - no source of bleeding  Fecal occult blood - negative  Hypertension  Blood pressure run somewhat low at times  BP goal normotensive  On coreg 25 bid, cozaar 100mg  daily, amlodipine 10mg    Off lasix  Hyperlipidemia  Home meds:  Lipitor 20 mg daily resumed in hospital  LDL 53, goal < 70  Continue statin at discharge  Diabetes  HgbA1c 8.1, goal < 7.0  Uncontrolled - glucoses running high  SSI  increased  levemir to 35 bid last night  continue novolog 4u q4h with tube feeding  Appreciate DM education RN assistance   Other Stroke Risk Factors  Advanced age  Morbid Obesity, Body mass index is 41.9 kg/m., recommend weight loss, diet and exercise as appropriate   PVD - RLE popliteal artery to peroneal artery bypass 02/28/2016  Other Active Problems  Hypokalemia, resolved 4.2  Leukocytosis 13.3->14.6  Improved BUN and creatinine - 1.28 -> 0.68  Right foot wound - VVS on board. treating with Santyl. granulation noted  Elevated troponin 0.1, EKG unchanged - T wave inversion lateral limb and precordial leads, not new, no change in management  Hospital day # 8   This patient is critically ill due to bilateral embolic stroke, left ICA stenosis s/p stent, VDRF,  anemiaand at significant risk of neurological worsening, death form recurrent stroke, hemorrhagic transformation, in-stent restenosis, shock. This patient's care requires constant monitoring of vital signs, hemodynamics, respiratory and cardiac monitoring, review of multiple databases, neurological assessment, discussion with family, other specialists and medical decision making of high complexity. I spent 34minutes of neurocritical care time in the care of this patient.  Rosalin Hawking, MD PhD Stroke Neurology 03/07/2016 2:48 PM   To contact Stroke Continuity provider, please refer to [email or web address] After hours, contact General Neurology

## 2016-03-07 NOTE — Progress Notes (Signed)
Speech Language Pathology Treatment: Dysphagia  Patient Details Name: [name] MRN: [id number] DOB: August 01, 1941 Today's Date: 03/07/2016 Time: [id number] SLP Time Calculation (min) (ACUTE ONLY): 15 min  Assessment / Plan / Recommendation Clinical Impression  Pt drowsy and sustained alertness longer than yesterday. Audible phonation with low intensity. Following most commands with delayed processing. Suspect delayed swallow initiation and laryngeal penetration indicated by immediate and delayed throat clears after thin liquid. Pt would benefit from MBS to objectively assess swallow function. MBS scheduled today at 1300.   HPI HPI: 75 year old admitted for bypass graft popliteal to peroneal for foot ulcer. Pt developed post stroke symptoms and MRI revealed acute patchy ischemic infarcts involving left basal ganglia and internal capsule, scattered small punctate cortical and white matter infarcts involving the bilateral cerebral hemispheres and right cerebellum. Intubated 7/28-8/1. No prior ST documentation.       SLP Plan  MBS     Recommendations  Diet recommendations: NPO Medication Administration: Via alternative means             Oral Care Recommendations: Oral care QID Follow up Recommendations: Skilled Nursing facility Plan: MBS     GO                Houston Siren 03/07/2016, 9:44 AM   Orbie Pyo Colvin Caroli[employer] [phone]

## 2016-03-08 ENCOUNTER — Inpatient Hospital Stay (HOSPITAL_COMMUNITY): Payer: Medicare HMO

## 2016-03-08 DIAGNOSIS — R1310 Dysphagia, unspecified: Secondary | ICD-10-CM

## 2016-03-08 DIAGNOSIS — I5020 Unspecified systolic (congestive) heart failure: Secondary | ICD-10-CM

## 2016-03-08 DIAGNOSIS — I509 Heart failure, unspecified: Secondary | ICD-10-CM

## 2016-03-08 DIAGNOSIS — R0600 Dyspnea, unspecified: Secondary | ICD-10-CM

## 2016-03-08 LAB — COMPREHENSIVE METABOLIC PANEL
ALBUMIN: 2.2 g/dL — AB (ref 3.5–5.0)
ALT: 37 U/L (ref 14–54)
ANION GAP: 6 (ref 5–15)
AST: 51 U/L — ABNORMAL HIGH (ref 15–41)
Alkaline Phosphatase: 51 U/L (ref 38–126)
BILIRUBIN TOTAL: 1.7 mg/dL — AB (ref 0.3–1.2)
BUN: 25 mg/dL — ABNORMAL HIGH (ref 6–20)
CHLORIDE: 107 mmol/L (ref 101–111)
CO2: 34 mmol/L — ABNORMAL HIGH (ref 22–32)
Calcium: 9 mg/dL (ref 8.9–10.3)
Creatinine, Ser: 0.56 mg/dL (ref 0.44–1.00)
GFR calc Af Amer: >60 mL/min (ref >60)
GFR calc non Af Amer: >60 mL/min (ref >60)
GLUCOSE: 87 mg/dL (ref 65–99)
POTASSIUM: 5.7 mmol/L — AB (ref 3.5–5.1)
SODIUM: 147 mmol/L — AB (ref 135–145)
TOTAL PROTEIN: 4.8 g/dL — AB (ref 6.5–8.1)

## 2016-03-08 LAB — GLUCOSE, CAPILLARY
GLUCOSE-CAPILLARY: 144 mg/dL — AB (ref 65–99)
GLUCOSE-CAPILLARY: 157 mg/dL — AB (ref 65–99)
GLUCOSE-CAPILLARY: 204 mg/dL — AB (ref 65–99)
GLUCOSE-CAPILLARY: 65 mg/dL (ref 65–99)
GLUCOSE-CAPILLARY: 81 mg/dL (ref 65–99)
Glucose-Capillary: 53 mg/dL — ABNORMAL LOW (ref 65–99)
Glucose-Capillary: 57 mg/dL — ABNORMAL LOW (ref 65–99)
Glucose-Capillary: 94 mg/dL (ref 65–99)
Glucose-Capillary: 108 mg/dL — ABNORMAL HIGH (ref 65–99)
Glucose-Capillary: 135 mg/dL — ABNORMAL HIGH (ref 65–99)

## 2016-03-08 LAB — CBC
HCT: 28.8 % — ABNORMAL LOW (ref 36.0–46.0)
Hemoglobin: 8.6 g/dL — ABNORMAL LOW (ref 12.0–15.0)
MCH: 28.6 pg (ref 26.0–34.0)
MCHC: 29.9 g/dL — ABNORMAL LOW (ref 30.0–36.0)
MCV: 95.7 fL (ref 78.0–100.0)
Platelets: 186 10*3/uL (ref 150–400)
RBC: 3.01 MIL/uL — ABNORMAL LOW (ref 3.87–5.11)
RDW: 16.7 % — ABNORMAL HIGH (ref 11.5–15.5)
WBC: 15.2 10*3/uL — ABNORMAL HIGH (ref 4.0–10.5)

## 2016-03-08 LAB — BASIC METABOLIC PANEL
Anion gap: 9 (ref 5–15)
BUN: 25 mg/dL — AB (ref 6–20)
CHLORIDE: 104 mmol/L (ref 101–111)
CO2: 29 mmol/L (ref 22–32)
Calcium: 8.7 mg/dL — ABNORMAL LOW (ref 8.9–10.3)
Creatinine, Ser: 0.65 mg/dL (ref 0.44–1.00)
GFR calc Af Amer: >60 mL/min (ref >60)
GFR calc non Af Amer: >60 mL/min (ref >60)
GLUCOSE: 192 mg/dL — AB (ref 65–99)
POTASSIUM: 4.5 mmol/L (ref 3.5–5.1)
Sodium: 142 mmol/L (ref 135–145)

## 2016-03-08 LAB — BLOOD GAS, ARTERIAL
Acid-Base Excess: 10 mmol/L — ABNORMAL HIGH (ref 0.0–2.0)
BICARBONATE: 35 meq/L — AB (ref 20.0–24.0)
DRAWN BY: 275531
O2 CONTENT: 4 L/min
O2 Saturation: 97.1 %
PATIENT TEMPERATURE: 98.6
PCO2 ART: 56.9 mmHg — AB (ref 35.0–45.0)
PH ART: 7.406 (ref 7.350–7.450)
TCO2: 36.8 mmol/L (ref 0–100)
pO2, Arterial: 91.3 mmHg (ref 80.0–100.0)

## 2016-03-08 MED ORDER — RACEPINEPHRINE HCL 2.25 % IN NEBU
INHALATION_SOLUTION | RESPIRATORY_TRACT | Status: AC
  Filled 2016-03-08: qty 0.5

## 2016-03-08 MED ORDER — INSULIN ASPART 100 UNIT/ML ~~LOC~~ SOLN
0.0000 [IU] | Freq: Three times a day (TID) | SUBCUTANEOUS | Status: DC
  Administered 2016-03-08: 5 [IU] via SUBCUTANEOUS
  Administered 2016-03-08: 2 [IU] via SUBCUTANEOUS
  Administered 2016-03-09: 15 [IU] via SUBCUTANEOUS
  Administered 2016-03-09: 8 [IU] via SUBCUTANEOUS
  Administered 2016-03-09: 3 [IU] via SUBCUTANEOUS

## 2016-03-08 MED ORDER — DEXAMETHASONE SODIUM PHOSPHATE 10 MG/ML IJ SOLN
10.0000 mg | Freq: Once | INTRAMUSCULAR | Status: AC
  Administered 2016-03-08: 10 mg via INTRAVENOUS
  Filled 2016-03-08: qty 1

## 2016-03-08 MED ORDER — INSULIN DETEMIR 100 UNIT/ML ~~LOC~~ SOLN
25.0000 [IU] | Freq: Two times a day (BID) | SUBCUTANEOUS | Status: DC
  Administered 2016-03-08 – 2016-03-09 (×3): 25 [IU] via SUBCUTANEOUS
  Filled 2016-03-08 (×4): qty 0.25

## 2016-03-08 MED ORDER — FUROSEMIDE 10 MG/ML IJ SOLN
INTRAMUSCULAR | Status: AC
  Filled 2016-03-08: qty 4

## 2016-03-08 MED ORDER — DEXAMETHASONE SODIUM PHOSPHATE 4 MG/ML IJ SOLN
6.0000 mg | Freq: Four times a day (QID) | INTRAMUSCULAR | Status: AC
  Administered 2016-03-08 – 2016-03-09 (×3): 6 mg via INTRAVENOUS
  Administered 2016-03-09: 4 mg via INTRAVENOUS
  Filled 2016-03-08 (×4): qty 2

## 2016-03-08 MED ORDER — FUROSEMIDE 10 MG/ML IJ SOLN
40.0000 mg | Freq: Once | INTRAMUSCULAR | Status: AC
  Administered 2016-03-08: 40 mg via INTRAVENOUS

## 2016-03-08 MED ORDER — RACEPINEPHRINE HCL 2.25 % IN NEBU
0.2500 mL | INHALATION_SOLUTION | Freq: Once | RESPIRATORY_TRACT | Status: AC
  Administered 2016-03-08: 0.25 mL via RESPIRATORY_TRACT

## 2016-03-08 MED ORDER — RACEPINEPHRINE HCL 2.25 % IN NEBU
0.5000 mL | INHALATION_SOLUTION | Freq: Once | RESPIRATORY_TRACT | Status: AC
  Administered 2016-03-08: 0.5 mL via RESPIRATORY_TRACT

## 2016-03-08 MED ORDER — FUROSEMIDE 10 MG/ML IJ SOLN
40.0000 mg | Freq: Once | INTRAMUSCULAR | Status: AC
  Administered 2016-03-08: 40 mg via INTRAVENOUS
  Filled 2016-03-08: qty 4

## 2016-03-08 MED ORDER — LABETALOL HCL 5 MG/ML IV SOLN
10.0000 mg | INTRAVENOUS | Status: DC | PRN
  Administered 2016-03-08 – 2016-03-10 (×6): 10 mg via INTRAVENOUS
  Filled 2016-03-08 (×8): qty 4

## 2016-03-08 NOTE — Progress Notes (Signed)
Nutrition Follow-up  DOCUMENTATION CODES:   Morbid obesity  INTERVENTION:   Magic cup TID with meals, each supplement provides 290 kcal and 9 grams of protein  NUTRITION DIAGNOSIS:   Increased nutrient needs related to wound healing as evidenced by estimated needs. Ongoing.   GOAL:   Patient will meet greater than or equal to 90% of their needs Met.   MONITOR:   PO intake, Supplement acceptance, Skin, I & O's, Labs  ASSESSMENT:   PMH of DM, diabetic neuropathy, GERD, HLD, HTN, aortic stenosis who was admitted on 7/26 for right popliteal bypass graft in the setting of chronic right lower extremity wound. 7/28 code stroke called, pt s/p L ICA revascularization.   8/1 extubated, Cortrak placed 8/3 diet advanced Pt has been eating 100% of her meals. Cortrak to be removed.  Pt discussed during ICU rounds and with RN.   Medications reviewed and include: ferrous sulfate, levemir, senokot Labs reviewed: Na 147, K+5.7 CBG's: 81-108   Diet Order:  DIET - DYS 1 Room service appropriate? Yes; Fluid consistency: Nectar Thick  Skin:  Wound (see comment) (R DM foot ulcer, R leg and L hip incision)  Last BM:  8/2  Height:   Ht Readings from Last 1 Encounters:  03/01/16 5' 5" (1.651 m)    Weight:   Wt Readings from Last 1 Encounters:  03/08/16 250 lb 10.6 oz (113.7 kg)    Ideal Body Weight:  56.8 kg  BMI:  Body mass index is 41.71 kg/m.  Estimated Nutritional Needs:   Kcal:  1850-2000  Protein:  105-125 grams  Fluid:  > 1.8 L/day  EDUCATION NEEDS:   No education needs identified at this time    RD, LDN, CNSC [phone] Pager [phone] After Hours Pager  

## 2016-03-08 NOTE — Progress Notes (Signed)
Inpatient Diabetes Program Recommendations  AACE/ADA: New Consensus Statement on Inpatient Glycemic Control (2015)  Target Ranges:  Prepandial:   less than 140 mg/dL      Peak postprandial:   less than 180 mg/dL (1-2 hours)      Critically ill patients:  140 - 180 mg/dL   Results for [name], [name] (MRN [id number]) as of 03/08/2016 09:13  Ref. Range 03/06/2016 23:20 03/07/2016 03:24 03/07/2016 08:21 03/07/2016 11:35 03/07/2016 15:50 03/07/2016 19:47  Glucose-Capillary Latest Ref Range: 65 - 99 mg/dL 174 (H) 185 (H) 184 (H) 202 (H) 158 (H) 151 (H)   Results for [name], [name] (MRN [id number]) as of 03/08/2016 09:13  Ref. Range 03/07/2016 23:30 03/08/2016 03:31 03/08/2016 03:34 03/08/2016 03:55 03/08/2016 04:15 03/08/2016 08:03  Glucose-Capillary Latest Ref Range: 65 - 99 mg/dL 116 (H) 57 (L) 53 (L) 65 94 81    Home DM Meds: Levemir 25 units bid       Novolog SSI  Current Insulin Orders: Levemir 35 units bid      Novolog 4 units Q4 hours (Tube Feed Coverage)      Novolog Moderate Correction Scale/ SSI (0-15 units) Q4 hours     -Patient with Hypoglycemia this AM.  Likely due to receiving Novolog tube feed coverage at [place] even though tube feeds were d/c'd.  -May need to decrease insulins since tube feeds are off now.    MD- Please consider the following in-hospital insulin adjustments:  1. Stop Novolog 4 units Q4 hours  2. Change Novolog Moderate Correction Scale/ SSI (0-15 units) to TID AC + HS (currently ordered as Q4 hour coverage)  3. Decrease Levemir to 25 units bid (home dose)     --Will follow patient during hospitalization--  Wyn Quaker RN, MSN, CDE Diabetes Coordinator Inpatient Glycemic Control Team Team Pager: [phone] (8a-5p)

## 2016-03-08 NOTE — Progress Notes (Signed)
Speech Language Pathology Treatment: Dysphagia  Patient Details Name: [name] MRN: [id number] DOB: [birth year] Today's Date: 03/08/2016 Time: 1212-1235 SLP Time Calculation (min) (ACUTE ONLY): 23 min  Assessment / Plan / Recommendation Clinical Impression  MD witnessed pt coughing with thin liquids this morning during breakfast. SLP observed with thin liquid via cup with labial spillage; suspected delayed swallow initiation. No cough or wet vocal quality exhibited however recommend nectar thick liquids due to pt's period of lethargy and decreased cognition/awareness, weak vocal quality, dependent feeder (unable to use bring cup to mouth effectively and is given liquids via cup increasing aspiration risk) and coughing this morning. ST will follow.    HPI HPI: 75 year old admitted for bypass graft popliteal to peroneal for foot ulcer. Pt developed post stroke symptoms and MRI revealed acute patchy ischemic infarcts involving left basal ganglia and internal capsule, scattered small punctate cortical and white matter infarcts involving the bilateral cerebral hemispheres and right cerebellum. Intubated 7/28-8/1. No prior ST documentation.       SLP Plan  Continue with current plan of care     Recommendations  Diet recommendations: Dysphagia 1 (puree);Nectar-thick liquid Liquids provided via: Cup;No straw Medication Administration: Crushed with puree Supervision: Staff to assist with self feeding;Full supervision/cueing for compensatory strategies Compensations: Slow rate;Small sips/bites;Minimize environmental distractions Postural Changes and/or Swallow Maneuvers: Seated upright 90 degrees             Oral Care Recommendations: Oral care BID Follow up Recommendations: Skilled Nursing facility Plan: Continue with current plan of care     [place], [name] 03/08/2016, 2:43 PM  [name] Caroli[employer] [phone]

## 2016-03-08 NOTE — Progress Notes (Signed)
Physical Therapy Treatment Patient Details Name: [name] MRN: [id number] DOB: 07/29/1941 Today's Date: 03/08/2016    History of Present Illness 75 year old admitted for bypass graft popliteal to peroneal for foot ulcer. Pt developed post stroke symptoms and MRI revealed acute patchy ischemic infarcts involving left basal ganglia and internal capsule, scattered small punctate cortical and white matter infarcts involving the bilateral cerebral hemispheres and right cerebellum. Intubated 7/28-8/1.     PT Comments    Patient seen for activity progression and OOB to chair. Patient with improved engagement this session, some verbalization throughout session. Patient demonstrating some AROM BLEs and improvements in activity tolerance and sitting balance at EOB. Patient still requires +2 maximal assist for pivot to chair. Continue to recommend SNF upon acute discharge. Will follow.   Follow Up Recommendations  SNF;Supervision/Assistance - 24 hour;Supervision for mobility/OOB     Equipment Recommendations  None recommended by PT    Recommendations for Other Services       Precautions / Restrictions Precautions Precautions: Fall Precaution Comments: Drain R LE Restrictions Weight Bearing Restrictions: No    Mobility  Bed Mobility Overal bed mobility: Needs Assistance Bed Mobility: Supine to Sit Rolling: Mod assist   Supine to sit: Total assist;+2 for physical assistance Sit to supine: Max assist   General bed mobility comments: Assist for LEs and trunk.  Transfers Overall transfer level: Needs assistance Equipment used:  (face to face with gait belt and chuck pad) Transfers: Sit to/from Stand;Stand Pivot Transfers Sit to Stand: Max assist;+2 physical assistance Stand pivot transfers: Max assist;+2 physical assistance       General transfer comment: Max assist +2 for sit to stand from EOB and stand pivot to chair.   Ambulation/Gait                 Stairs             Wheelchair Mobility    Modified Rankin (Stroke Patients Only)       Balance Overall balance assessment: Needs assistance Sitting-balance support: Feet supported Sitting balance-Leahy Scale: Poor Sitting balance - Comments: able to sit self supported for very breif period of time <10 seconds, min to moderate assist otherwise Postural control: Posterior lean Standing balance support: Bilateral upper extremity supported Standing balance-Leahy Scale: Zero                      Cognition Arousal/Alertness: Awake/alert (Lethargic at end of session) Behavior During Therapy: Flat affect Overall Cognitive Status: Impaired/Different from baseline Area of Impairment: Attention;Following commands Orientation Level: Disoriented to;Time Current Attention Level: Sustained Memory: Decreased short-term memory Following Commands: Follows one step commands inconsistently;Follows one step commands with increased time Safety/Judgement: Decreased awareness of safety;Decreased awareness of deficits Awareness: Intellectual Problem Solving: Slow processing;Decreased initiation;Difficulty sequencing;Requires verbal cues;Requires tactile cues General Comments: patient much more verbal this session, following commands consistently with increased time.    Exercises      General Comments        Pertinent Vitals/Pain Pain Assessment: Faces Faces Pain Scale: Hurts even more Pain Location: "all over"  Pain Descriptors / Indicators: Grimacing Pain Intervention(s): Monitored during session;Repositioned    Home Living                      Prior Function            PT Goals (current goals can now be found in the care plan section) Acute Rehab PT Goals Patient Stated  Goal: none stated PT Goal Formulation: Patient unable to participate in goal setting Time For Goal Achievement: 03/20/16 Potential to Achieve Goals: Fair Progress towards PT goals: Progressing toward  goals    Frequency  Min 3X/week    PT Plan Current plan remains appropriate    Co-evaluation PT/OT/SLP Co-Evaluation/Treatment: Yes Reason for Co-Treatment: Complexity of the patient's impairments (multi-system involvement);For patient/therapist safety PT goals addressed during session: Mobility/safety with mobility OT goals addressed during session: Strengthening/ROM;Other (comment) (mobility)     End of Session Equipment Utilized During Treatment: Gait belt;Oxygen Activity Tolerance: Patient limited by fatigue;Patient limited by pain Patient left: in chair;with call bell/phone within reach;with chair alarm set;with family/visitor present     Time: [id number] (-6 minutes for chest XRay) PT Time Calculation (min) (ACUTE ONLY): 37 min  Charges:  $Therapeutic Activity: 8-22 mins                    G CodesDuncan Dull 2016/04/06, 11:27 AM Alben Deeds, PT DPT  [phone]

## 2016-03-08 NOTE — Progress Notes (Signed)
Occupational Therapy Treatment Patient Details Name: [name] MRN: [id number] DOB: [birth year] Today's Date: 03/08/2016    History of present illness 75 year old admitted for bypass graft popliteal to peroneal for foot ulcer. Pt developed post stroke symptoms and MRI revealed acute patchy ischemic infarcts involving left basal ganglia and internal capsule, scattered small punctate cortical and white matter infarcts involving the bilateral cerebral hemispheres and right cerebellum. Intubated 7/28-8/1.    OT comments  Pt more alert, engaged in therapy, and able to verbalize during session today. Pt continues to require max assist for ADL and max assist +2 for basic transfers. Pt noted to have improvements in RUE AROM. Pt with increased edema in bil UEs. Tolerated retrograde massage to bil UEs and able to perform digit flex/ext x5 bil. D/c plan remains appropriate at this time. Will continue to follow acutely.    Follow Up Recommendations  SNF;Supervision/Assistance - 24 hour    Equipment Recommendations  Other (comment) (TBD at next venue)    Recommendations for Other Services      Precautions / Restrictions Precautions Precautions: Fall Precaution Comments: Drain R LE Restrictions Weight Bearing Restrictions: No       Mobility Bed Mobility Overal bed mobility: Needs Assistance Bed Mobility: Supine to Sit Rolling: Mod assist   Supine to sit: Total assist;+2 for physical assistance Sit to supine: Max assist   General bed mobility comments: Assist for LEs and trunk.  Transfers Overall transfer level: Needs assistance Equipment used:  (face to face with gait belt and chuck pad) Transfers: Sit to/from Stand;Stand Pivot Transfers Sit to Stand: Max assist;+2 physical assistance Stand pivot transfers: Max assist;+2 physical assistance       General transfer comment: Max assist +2 for sit to stand from EOB and stand pivot to chair.     Balance Overall balance assessment:  Needs assistance Sitting-balance support: Feet supported Sitting balance-Leahy Scale: Poor Sitting balance - Comments: able to sit self supported for very breif period of time <10 seconds, min to moderate assist otherwise Postural control: Posterior lean Standing balance support: Bilateral upper extremity supported Standing balance-Leahy Scale: Zero                     ADL Overall ADL's : Needs assistance/impaired Eating/Feeding: Maximal assistance;Bed level Eating/Feeding Details (indicate cue type and reason): requires assist to bring cup to mouth to drink coffee                     Toilet Transfer: Maximal assistance;+2 for physical assistance;Stand-pivot Toilet Transfer Details (indicate cue type and reason): Simulated by stand pivot from EOB to chair.         Functional mobility during ADLs: Maximal assistance;+2 for physical assistance (for stand pivot only) General ADL Comments: Pt with significant increase in edema in bil UEs; educated pts daughter on proper positioning and encouraging of digits. Pt noted to have some AROM in R UE; able to lift off bed for a few seconds and wiggle fingers. VSS throughout.      Vision                     Perception     Praxis      Cognition   Behavior During Therapy: Flat affect Overall Cognitive Status: Impaired/Different from baseline Area of Impairment: Attention;Following commands Orientation Level: Disoriented to;Time Current Attention Level: Sustained Memory: Decreased short-term memory  Following Commands: Follows one step commands inconsistently;Follows one step commands  with increased time Safety/Judgement: Decreased awareness of safety;Decreased awareness of deficits Awareness: Intellectual Problem Solving: Slow processing;Decreased initiation;Difficulty sequencing;Requires verbal cues;Requires tactile cues General Comments: patient much more verbal this session, following commands consistently with  increased time.    Extremity/Trunk Assessment               Exercises Other Exercises Other Exercises: Pt tolerated retrograde massage to bil hands/forearms. Other Exercises: Pt able to perform digit composite flex/ext with bil hands x 5 each.   Shoulder Instructions       General Comments      Pertinent Vitals/ Pain       Pain Assessment: Faces Faces Pain Scale: Hurts even more Pain Location: "all over"  Pain Descriptors / Indicators: Grimacing Pain Intervention(s): Monitored during session;Repositioned  Home Living                                          Prior Functioning/Environment              Frequency Min 2X/week     Progress Toward Goals  OT Goals(current goals can now be found in the care plan section)  Progress towards OT goals: Progressing toward goals  Acute Rehab OT Goals Patient Stated Goal: none stated OT Goal Formulation: With patient/family  Plan Discharge plan remains appropriate    Co-evaluation    PT/OT/SLP Co-Evaluation/Treatment: Yes Reason for Co-Treatment: Complexity of the patient's impairments (multi-system involvement);For patient/therapist safety   OT goals addressed during session: Strengthening/ROM;Other (comment) (mobility)      End of Session Equipment Utilized During Treatment: Gait belt;Oxygen   Activity Tolerance Patient tolerated treatment well   Patient Left in chair;with call bell/phone within reach;with chair alarm set;with family/visitor present;with restraints reapplied   Nurse Communication          Time: [id number] OT Time Calculation (min): 36 min (x-ray present for ~5 minutes)  Charges: OT General Charges $OT Visit: 1 Procedure OT Treatments $Therapeutic Activity: 8-22 mins  Binnie Kand M.S., OTR/L Pager: [phone]  03/08/2016, 10:57 AM

## 2016-03-08 NOTE — Progress Notes (Signed)
Pt called me into room, states "I cant breathe" See is slightly tachypneic, Sat 100% on 4 L O2, with some expiratory wheezes. RT to room, nebulizer being given. Continue to monitor closely.

## 2016-03-08 NOTE — Progress Notes (Signed)
STROKE TEAM PROGRESS NOTE   SUBJECTIVE (INTERVAL HISTORY) Daughter at bedside. Patient being fed by nursing tech, slow but tolerating foods. Dr. Erlinda Hong discussed current plans with daughter - labs and CXR this afternoon - if improved, d/c TF. Plan transfer to stepdown. Pt more awake and alert, still has wheezing. Na much improved but K was high at 5.7. Given lasix and repeat CXR and K in pm.    OBJECTIVE Temp:  [97.9 F (36.6 C)-99.1 F (37.3 C)] 97.9 F (36.6 C) (08/04 0800) Pulse Rate:  [68-80] 71 (08/04 0730) Cardiac Rhythm: Normal sinus rhythm (08/03 2000) Resp:  [16-28] 20 (08/04 0730) BP: (129-189)/(35-127) 157/49 (08/04 0730) SpO2:  [98 %-100 %] 100 % (08/04 0730) Weight:  [113.7 kg (250 lb 10.6 oz)] 113.7 kg (250 lb 10.6 oz) (08/04 0600)  CBC:   Recent Labs Lab 03/04/16 0550  03/05/16 0600  03/07/16 0422 03/08/16 0539  WBC 10.4  < > 12.1*  < > 14.6* 15.2*  NEUTROABS 7.8*  --  9.5*  --   --   --   HGB 7.8*  < > 7.8*  < > 8.4* 8.6*  HCT 24.5*  < > 25.5*  < > 28.6* 28.8*  MCV 91.4  < > 92.7  < > 95.7 95.7  PLT 147*  < > 158  < > 225 186  < > = values in this interval not displayed.  Basic Metabolic Panel:   Recent Labs Lab 03/05/16 0600 03/06/16 0500 03/07/16 0422 03/08/16 0539  NA 148* 154* 154* 147*  K 3.5 3.3* 4.2 5.7*  CL 113* 113* 115* 107  CO2 31 36* 35* 34*  GLUCOSE 238* 187* 215* 87  BUN 44* 37* 33* 25*  CREATININE 0.88 0.75 0.68 0.56  CALCIUM 8.6* 9.0 9.2 9.0  MG 2.3 2.2  --   --   PHOS 2.5 2.4*  --   --     Lipid Panel:     Component Value Date/Time   CHOL 109 03/02/2016 0600   TRIG 71 03/06/2016 0500   HDL 32 (L) 03/02/2016 0600   CHOLHDL 3.4 03/02/2016 0600   VLDL 24 03/02/2016 0600   LDLCALC 53 03/02/2016 0600   HgbA1c:  Lab Results  Component Value Date   HGBA1C 7.7 (H) 03/02/2016   Urine Drug Screen:     Component Value Date/Time   LABOPIA NONE DETECTED 02/12/2014 0110   COCAINSCRNUR NONE DETECTED 02/12/2014 0110   LABBENZ NONE  DETECTED 02/12/2014 0110   AMPHETMU NONE DETECTED 02/12/2014 0110   THCU NONE DETECTED 02/12/2014 0110   LABBARB NONE DETECTED 02/12/2014 0110      IMAGING Dr. Erlinda Hong has personally reviewed the radiological images below and agree with the radiology interpretations.  Ct Head Code Stroke Wo Contrast 03/01/2016 Age related volume loss with mild periventricular small vessel disease. No acute infarct evident currently. No edema or hemorrhage. No hyperdense vessel evident. Basal ganglia calcification is physiologic.   Ct Angio Head and Neck W Or Wo Contrast 03/01/2016 Near occlusion of the left ICA at the origin. Reduced caliber of the left cervical internal carotid artery because of reduced inflow. 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 Stenoses estimated at 50% bilaterally. Probable serial stenoses. No M1 stenosis and no missing M2 branches on the left.    Ct Cerebral Perfusion W Contrast 03/01/2016 At risk brain in the left MCA territory showing normal cerebral blood volume but decreased cerebral blood flow, mean transit time and  time to peak. No evidence of completed infarction.   Cerebral Angiogram 03/01/2016 S./P bilateral  Common  Carotid arteriogram followed by by endovascular revascularization of  preocclusive lt ICA stenosis prox with a thrombus with stent assisted angioplasty and 4.5 mg of IA Integrelin TICI flow MCA distribution  Ct Head Wo Contrast 03/01/2016 No change or acute finding following left carotid artery stent.   MRI HEAD  03/02/2016 1. Acute patchy ischemic infarcts involving left basal ganglia and internal capsule, with extension into periventricular white matter of the left corona radiata/centrum semi ovale. No associated hemorrhage or mass effect.  2. Additional scattered small punctate cortical and white matter infarcts involving the bilateral cerebral hemispheres and right cerebellum as above. No associated  hemorrhage or mass effect.  3. 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 No associated edema.   MRA HEAD 03/02/2016 1. No large vessel occlusion within the intracranial circulation.  2. Short-segment severe stenosis at the supraclinoid left ICA.  3. Severe stenosis proximal right PCA with attenuated flow distally.  4. Additional multi focal atheromatous irregularity involving the anterior posterior circulations as above without severe or correctable stenosis.  5. Hypoplastic/absent left A1 segment, likely accounting for the diminutive left ICA is compared to the right.   Transthoracic echocardiogram 03/02/2016 - Left ventricle: The cavity size was normal. There was moderate concentric hypertrophy. Systolic function was normal. The estimated ejection fraction was in the range of 55% to 60%. Although no diagnostic regional wall motion abnormality was identified, this possibility cannot be completely excluded on the basis of this study. Features are consistent with a pseudonormal left ventricular filling pattern, with concomitant abnormal relaxation and increased filling pressure (grade 2 diastolic dysfunction). - Aortic valve: There was mild stenosis. Valve area (VTI): 1.28 cm^2. Valve area (Vmax): 1.18 cm^2. Valve area (Vmean): 1.04 cm^2. - Left atrium: The atrium was moderately dilated. - Pericardium, extracardiac: A small pericardial effusion was identified circumferential to the heart. Impressions: - There is an echo free space posterior the left ventricle that most likely represents a left pleural effusion, but image quality is poor. Cannot entirely exclude a loculated pericardial efusion. Correlate with radiological studies.  LE venous doppler - Bilateral:  No evidence of DVT, superficial thrombosis, or Baker's Cyst.  CXR  03/07/2016 Bilateral effusions and lower lung volume loss, slowly worsening over the last  week. 03/08/2016 IMPRESSION: Stable moderate to large bilateral pleural effusions and associated dense passive atelectasis and/or pneumonia in the lower lobes, left greater than right. No new abnormalities.     PHYSICAL EXAM  Temp:  [97.9 F (36.6 C)-99.1 F (37.3 C)] 97.9 F (36.6 C) (08/04 0800) Pulse Rate:  [68-80] 71 (08/04 0730) Resp:  [16-28] 20 (08/04 0730) BP: (129-189)/(35-127) 157/49 (08/04 0730) SpO2:  [98 %-100 %] 100 % (08/04 0730) Weight:  [113.7 kg (250 lb 10.6 oz)] 113.7 kg (250 lb 10.6 oz) (08/04 0600)  General - Well nourished, well developed, mild respiratory distress.  Ophthalmologic - Fundi not visualized due to noncooperation.  Cardiovascular - Regular rate and rhythm.  Extremities - right foot proximal to big toe wound with dressing  Neuro - awake alert, eyes open on voice, orientated to place, year and people. Able to follow commands. PERRL, mid position, right side neglect, left eye gaze preference but able to cross midline to the right. Left facial droop, tongue in middle.LUE 4/5, RUE 3-/5, BLE 2/5. DTR 1+ and no babinski. Sensation and coordination not cooperative,  gait not tested.   ASSESSMENT/PLAN Ms. LORIN RYLANDER is a 75 y.o. female with history of hypertension, hyperlipidemia, diabetes with diabetic neuropathy, aortic stenosis, and a nonhealing ischemic right foot ulcer status post right lower extremity bypass graft surgery performed 02/28/2016 who developed aphasia, right hemiplegia, and left-sided gaze preference in hopsital. She did not receive IV t-PA due to recent surgery. CT angio showed a near occlusion of the L ICA , perfusion showed brain at risk. She was taken to IR where she received stent assisted angioplasty and IA Integrelin; a L ICA stent was placed.  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  Severe stenosis proximal right PCA.  CTA head and neck - near occlusion L ICA, R ICA 50%, intracranial stenosis  Cerebral angio - left ICA clot s/p ICA stent  2D Echo - EF 55-60%. No cardiac source of emboli identified.   LE venous doppler negative for DVT  Plan CTV pelvis to look for pelvic clot once more stabilized and able to lie flat  Consider TEE/loop later if CTV pelvis unremarkable  LDL - 53  HgbA1c 8.1  VTE prophylaxis - SCDs DIET - DYS 1 Room service appropriate? Yes; Fluid consistency: Thin. Has tube feedings and CoreTrack. Passed swallow - eating breakfast. RN to monitor intake - if 75% or greater, will d/c TF. Some cough with breakfast->has ST reassess  aspirin 81 mg daily prior to admission, now on aspirin 325 mg daily and clopidogrel 75 mg daily   Patient/family counseled to be compliant with her antithrombotic medications  Ongoing aggressive stroke risk factor management  Therapy recommendations: SNF  Disposition: Pending (lived at home, since fall 2 yrs ago wheelchair bound at baseline could stand for xfer, at [facility] in the past)  Transfer to step down later this afternoon pending CXR results  Acute Hypoxic Respiratory Failure  Lethargy at stroke onset  Intubated prior to procedure  Extubated 8/1 without difficulty  CCM managed  CXR today - continued b/l pleural effusion, gradual worsening over the past 1 week.   repeat CXR today no significant change  Lasix 40mg  for CHF and pleural effusion  Hypernatremia   Na 148->154->154->147  Likely due to lasix and diuresis   free water for gentle hydration  Lasix 60 bid has been on hold, will given 40 ig x 1 today  Anemia   received 2 unit PRBCs 7/27, 1u 7/28, 1u 7/30.   Hgb  6.6->8.2->7.8->8.1->8.6  Close monitoring  Continue DAPT for left ICA stent  CT abd - no source of bleeding  Fecal occult blood - negative  Dysphagia   Improved and passed swallow  Difficulty with thin liquid and will change to nectar thick  Speech re-evaluation  If able to take > 75% of diet, will d/c TF  Hypertension  Blood pressure run somewhat low at times  BP goal normotensive  On coreg 25 bid, cozaar 100mg  daily, amlodipine 10mg    Lasix 60 bid has been on hold, will given 40 ig x 1 today  Hyperlipidemia  Home meds:  Lipitor 20 mg daily resumed in hospital  LDL 53, goal < 70  Continue statin at discharge  Diabetes  HgbA1c 8.1, goal < 7.0  Uncontrolled - glucoses running high  SSI  decreased levemir to 25 bid this am  Change SSI to ac & HS  Discontinue novolog 4u q4h with tube feeding as taking POs  Appreciate DM education RN assistance   Other Stroke Risk Factors  Advanced age  Morbid Obesity, Body mass index is 41.71 kg/m., recommend weight loss, diet and exercise as appropriate   PVD - RLE popliteal artery to peroneal artery bypass 02/28/2016  Other Active Problems  Hyperkalemia 4.2->5.7. Check BMET this afternoon and lasix one dose  Leukocytosis 13.3->14.6->15.2  Improved BUN and creatinine - 1.28 -> 0.56  Right foot wound - VVS on board. treating with Santyl. granulation noted  Elevated troponin 0.1, EKG unchanged - T wave inversion lateral limb and precordial leads, not new, no change in management  Hospital day # 9   This patient is critically ill due to bilateral embolic stroke, left ICA stenosis s/p stent, VDRF, anemiaand at significant risk of neurological worsening, death form recurrent stroke, hemorrhagic transformation, in-stent restenosis, shock. This patient's care requires constant monitoring of vital signs, hemodynamics, respiratory and cardiac monitoring, review of multiple databases, neurological assessment, discussion  with family, other specialists and medical decision making of high complexity. I spent 40minutes of neurocritical care time in the care of this patient.  Rosalin Hawking, MD PhD Stroke Neurology 03/08/2016 1:13 PM    To contact Stroke Continuity provider, please refer to [email or web address] After hours, contact General Neurology

## 2016-03-08 NOTE — Progress Notes (Signed)
Pt still dyspeic, wheezing. Sat 100 on 4L. Subjective dyspnea. Dr Erlinda Hong and Dr Titus Mould notified. Orders received.

## 2016-03-08 NOTE — Progress Notes (Signed)
   Daily Progress Note  Assessment/Planning: POD #9s/p R pop to peroneal bypass with ips rGSV,  POD #7 s/p L ICA stenting by NeuroIR, B CVA, Ansarca, severe protein malnutrition, severe supraclinoid L ICA stenosis   Continued incremental neuro recovery  ST evaluation noted.  Swallow limitations documents.  Pt able to take some limited PO currently.  Right foot wound remain clean  Continue Santyl to right foot BID.  Wet-to-dry dressing with NS BID  Lasix for HyperK  Appreciate neuro's mgmt of this patient   Subjective    Reported more active than yesterday, sleeping currently  Objective Vitals:   03/08/16 0700 03/08/16 0715 03/08/16 0730 03/08/16 0800  BP: (!) 167/55 (!) 168/39 (!) 157/49   Pulse: 73 71 71   Resp: (!) 23 20 20    Temp:    97.9 F (36.6 C)  TempSrc:    Oral  SpO2: 100% 100% 100%   Weight:      Height:        Intake/Output Summary (Last 24 hours) at 03/08/16 1106 Last data filed at 03/08/16 0700  Gross per 24 hour  Intake             2740 ml  Output                0 ml  Net             2740 ml   PULM  BLL rales CV  RRR GI  soft, NTND VASC  R 1st MT wound: clean, +granulation, dopplerable peroneal  DP signal  Laboratory CBC    Component Value Date/Time   WBC 15.2 (H) 03/08/2016 0539   HGB 8.6 (L) 03/08/2016 0539   HCT 28.8 (L) 03/08/2016 0539   PLT 186 03/08/2016 0539    BMET    Component Value Date/Time   NA 147 (H) 03/08/2016 0539   K 5.7 (H) 03/08/2016 0539   CL 107 03/08/2016 0539   CO2 34 (H) 03/08/2016 0539   GLUCOSE 87 03/08/2016 0539   BUN 25 (H) 03/08/2016 0539   CREATININE 0.56 03/08/2016 0539   CALCIUM 9.0 03/08/2016 0539   GFRNONAA >60 03/08/2016 0539   GFRAA >60 03/08/2016 [place], MD [facility] Office: [phone] Pager: [phone]  03/08/2016, 11:06 AM

## 2016-03-08 NOTE — Care Management Important Message (Signed)
Important Message  Patient Details  Name: [name] MRN: [id number] Date of Birth: 08-24-1940   Medicare Important Message Given:  Yes    Nathen May 03/08/2016, 11:04 AM

## 2016-03-08 NOTE — Progress Notes (Signed)
Pt CBG 57, rechecked on opposite limb, second check was 53. Gave pt Orange juice and will recheck CBG per protocol.

## 2016-03-08 NOTE — Progress Notes (Signed)
PULMONARY / CRITICAL CARE MEDICINE   Name: [name] MRN: [id number] DOB: 06/13/1941    ADMISSION DATE:  02/28/2016 CONSULTATION DATE:  03/01/16  REFERRING MD:  Dr. Leonie Man / Stroke MD   CHIEF COMPLAINT:  CVA, Acute Respiratory Failure   HISTORY OF PRESENT ILLNESS:  75 y/o M with PMH of DM, diabetic neuropathy, GERD, HLD, HTN, aortic stenosis who was admitted on 7/26 for right popliteal bypass graft in the setting of chronic right lower extremity wound.  On 7/26 she underwent a right below the knee popliteal artery to peroneal artery bypass. Postoperatively she was treated with heparin and transitioned to Plavix on 7/27. JP drain postoperatively had high output. She was evaluated by physical therapy and recommended for skilled nursing facility placement. Postoperatively there were concern for weak doppler flow. On 7/28, she was noted to have worsening of peroneal signal consistent with early failure and anemia requiring transfusion of 2 units PRBCs. Palliative amputation was recommended.  On re-rounding, the patient was noted to have altered mental status with right-sided weakness and left gaze. Code Stroke called for further evaluation.  Upon*team evaluation, she was noted to have incomprehensible speech and moaning as well as right hemiplegia. She was emergently taken to neuro-interventional radiology where she underwent procedure for revascularization (stent assisted angioplasty) of her left ICA in the setting of thrombus.  The patient was intubated and returned to ICU for further observation.  PCCM consulted for ICU assistance  reconsult 8/4- after NGT removed, vocal cord exp wheezing, distress  SUBJECTIVE:  Mild distress. speaking  VITAL SIGNS: BP (!) 171/51   Pulse 76   Temp 97.8 F (36.6 C) (Oral)   Resp (!) 25   Ht 5\' 5"  (1.651 m)   Wt 113.7 kg (250 lb 10.6 oz)   SpO2 100%   BMI 41.71 kg/m   HEMODYNAMICS:    VENTILATOR SETTINGS:    INTAKE / OUTPUT:  Intake/Output  Summary (Last 24 hours) at 03/08/16 1516 Last data filed at 03/08/16 0700  Gross per 24 hour  Intake             2380 ml  Output                0 ml  Net             2380 ml     PHYSICAL EXAMINATION:  General: obese 75 year old aaf, midl distress, speaking sentences  Neuro: awake, speech unintelligible. Strength seems equally diminished. Follows commands.  HEENT: NO stridor, has moderate exp wheeZing from cords   PULM: reduced bs  CV: rrr w/ systolic murmur III/VI GI: soft, not tender Tolerating tube feeds.  Extremities: edematous. Weak pulses  LABS:  BMET  Recent Labs Lab 03/06/16 0500 03/07/16 0422 03/08/16 0539  NA 154* 154* 147*  K 3.3* 4.2 5.7*  CL 113* 115* 107  CO2 36* 35* 34*  BUN 37* 33* 25*  CREATININE 0.75 0.68 0.56  GLUCOSE 187* 215* 87    Electrolytes  Recent Labs Lab 03/04/16 0550  03/05/16 0600 03/06/16 0500 03/07/16 0422 03/08/16 0539  CALCIUM 8.3*  < > 8.6* 9.0 9.2 9.0  MG 2.2  --  2.3 2.2  --   --   PHOS 2.6  --  2.5 2.4*  --   --   < > = values in this interval not displayed.  CBC  Recent Labs Lab 03/06/16 0500 03/07/16 0422 03/08/16 0539  WBC 13.3* 14.6* 15.2*  HGB 8.1* 8.4*  8.6*  HCT 27.0* 28.6* 28.8*  PLT 188 225 186    Coag's No results for input(s): APTT, INR in the last 168 hours.  Sepsis Markers  Recent Labs Lab 03/01/16 2030 03/01/16 2042 03/01/16 2350 03/02/16 0600 03/03/16 0512  LATICACIDVEN  --  1.9 1.9  --   --   PROCALCITON 0.32  --   --  0.41 0.28    ABG  Recent Labs Lab 03/01/16 1623 03/04/16 1322  PHART 7.379 7.539*  PCO2ART 42.6 32.3*  PO2ART 268.0* 142.0*    Liver Enzymes  Recent Labs Lab 03/06/16 0500 03/07/16 0422 03/08/16 0539  AST 17 17 51*  ALT 28 30 37  ALKPHOS 62 52 51  BILITOT 0.3 0.4 1.7*  ALBUMIN 2.2* 2.3* 2.2*    Cardiac Enzymes  Recent Labs Lab 03/04/16 1752 03/04/16 2330  TROPONINI 0.10* 0.11*    Glucose  Recent Labs Lab 03/08/16 0331 03/08/16 0334  03/08/16 0355 03/08/16 0415 03/08/16 0803 03/08/16 1144  GLUCAP 57* 53* 65 94 81 108*    Imaging Dg Chest Port 1 View  Result Date: 03/08/2016 CLINICAL DATA:  Followup bilateral pleural effusions and associated atelectasis/ pneumonia in the lower lobes. EXAM: PORTABLE CHEST 1 VIEW COMPARISON:  03/07/2016, 03/06/2016 and earlier. FINDINGS: Suboptimal inspiration. Cardiac silhouette markedly enlarged, unchanged. Mild pulmonary venous hypertension, unchanged, without overt edema. Moderate to large bilateral pleural effusions and associated dense consolidation in the lower lobes, left greater than right, unchanged. No new pulmonary parenchymal abnormalities. IMPRESSION: Stable moderate to large bilateral pleural effusions and associated dense passive atelectasis and/or pneumonia in the lower lobes, left greater than right. No new abnormalities. Electronically Signed   By: Evangeline Dakin M.D.   On: 03/08/2016 10:11     STUDIES:  CTA Head / Neck 7/28: At risk brain in the L MCA territory showing normal cerebral blood volume but decreased cerebral flow CT Cerebral Perfusion 7/28: near occlusion of the L ICA at the origin Abd X-ray 7/28: Enteric catheter with tip overlying the peripyloric region. Port CXR 7/28: Endotracheal tube in good position. Enteric feeding tube coursing below diaphragm. Right internal jugular central venous catheter in place. Low lung volumes. Bilateral basilar opacities. MRI/MRA of Brain 7/29: Acute patchy ischemic infarcts left basal ganglia & internal capsule with extension into periventricular white matter of left corona radiata/centrum semi-ovale. No hemorrhage or mass effect. Scattered small punctate cortical & white matter infarcts involving bilateral cerebral hemispheres & rites or bowel movement. No associated mass or hemorrhage. 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 No large vessel occlusion within intracranial circulation. Short segment severe  stenosis left ICA. Severe stenosis proximal right PCA. Multifocal atheromatous irregularity involving anterior & posterior circulations without severe or correctable stenosis. TTE 7/29: Moderate concentric LVH. EF 55-60%. Grade 2 diastolic dysfunction. Cannot exclude wall motion abnormality. LA moderately dilated & RA normal in size. RV normal in size and function. Mild aortic regurgitation without stenosis. No mitral regurgitation or stenosis. No pulmonic stenosis. No tricuspid regurgitation. Small circumferential pericardial effusion.  Port CXR 7/30: Enteric feeding tube coursing below diaphragm. Endotracheal tube in good position. Bilateral basilar opacities unchanged. Right internal jugular central venous catheter unchanged in position. 7/30 CT abdo - no bleed  MICROBIOLOGY: MRSA PCR 7/24:  Negative Tracheal Asp Ctx 7/29: few GNR GNC and GPC pairs>>>  ANTIBIOTICS: None  SIGNIFICANT EVENTS: 7/26 - Admit for R popliteal artery to peroneal artery bypass 7/28 - Code Stroke called for R sided weakness, to neuro IR with near occlusion  of L ICA s/p revascularization    8/4 NGT removed, passed swallow, developed focal cord wheezing, distress  LINES/TUBES: OETT 7/28 >> 8/1 R IJ Dual Lumen 7/28 >>  Foley 7/26 >> L Fem Sheat 7/28 - 7/29 L Fem Art Line 7/28 - 7/29 R UE Midline IV 7/28 >>  ASSESSMENT / PLAN:  NEUROLOGIC A:   L ICA CVA - S/P Revascularization [id number] to be embolic but source unclear. She is s/p Left ICA stent.   P:   Management per Neuro IR/Stroke Service ASA 325mg  daily Plavix 75mg  daily Lyrica VT q8hr, when able PT consult   PULMONARY A: Acute Hypoxic Respiratory Failure - Possible aspiration in setting of acute CVA. Extubated 8/1 8/4 ASP event / cord irritation after NGT removed Edema, r/o effusion rt P:   Weaning FiO2 for Sat >92% Albuterol Neb prn NPO strict Stat abg Stat pcxr Lasix repeat Decadron then maintain Racemic epi attempt Would NOT offer  BIPAP if declines further, would move to ETT  GASTROINTESTINAL A:   Obesity  Severe dysphagia  P:   NPO Dc tubes, no new tube as risk to reinjure cords Re assess in am safety of replacement  FAMILY  - Updates:NO family at bedside, I updated the pt  - Inter-disciplinary family meet or Palliative Care meeting due by:  8/4  Ccm time 30 min   Lavon Paganini. Titus Mould, MD, [place] Pgr: [facility] 03/08/2016 3:16 PM

## 2016-03-08 NOTE — Progress Notes (Signed)
Pt given racemic epi, decadron, lasix. MD Titus Mould at bedside. Pt slightly less dyspneic, monitor closely in ICU. Keep NPO tonight.

## 2016-03-09 ENCOUNTER — Inpatient Hospital Stay (HOSPITAL_COMMUNITY): Payer: Medicare HMO

## 2016-03-09 DIAGNOSIS — R062 Wheezing: Secondary | ICD-10-CM

## 2016-03-09 DIAGNOSIS — E785 Hyperlipidemia, unspecified: Secondary | ICD-10-CM | POA: Diagnosis present

## 2016-03-09 DIAGNOSIS — E782 Mixed hyperlipidemia: Secondary | ICD-10-CM | POA: Diagnosis present

## 2016-03-09 DIAGNOSIS — I5023 Acute on chronic systolic (congestive) heart failure: Secondary | ICD-10-CM

## 2016-03-09 DIAGNOSIS — J90 Pleural effusion, not elsewhere classified: Secondary | ICD-10-CM

## 2016-03-09 DIAGNOSIS — R0989 Other specified symptoms and signs involving the circulatory and respiratory systems: Secondary | ICD-10-CM

## 2016-03-09 LAB — BASIC METABOLIC PANEL
Anion gap: 9 (ref 5–15)
BUN: 22 mg/dL — AB (ref 6–20)
CO2: 31 mmol/L (ref 22–32)
Calcium: 9.2 mg/dL (ref 8.9–10.3)
Chloride: 104 mmol/L (ref 101–111)
Creatinine, Ser: 0.52 mg/dL (ref 0.44–1.00)
GFR calc Af Amer: >60 mL/min (ref >60)
GLUCOSE: 167 mg/dL — AB (ref 65–99)
POTASSIUM: 4.4 mmol/L (ref 3.5–5.1)
SODIUM: 144 mmol/L (ref 135–145)

## 2016-03-09 LAB — GLUCOSE, CAPILLARY
GLUCOSE-CAPILLARY: 168 mg/dL — AB (ref 65–99)
GLUCOSE-CAPILLARY: 395 mg/dL — AB (ref 65–99)
GLUCOSE-CAPILLARY: 457 mg/dL — AB (ref 65–99)
GLUCOSE-CAPILLARY: 449 mg/dL — AB (ref 65–99)
Glucose-Capillary: 163 mg/dL — ABNORMAL HIGH (ref 65–99)
Glucose-Capillary: 261 mg/dL — ABNORMAL HIGH (ref 65–99)
Glucose-Capillary: 357 mg/dL — ABNORMAL HIGH (ref 65–99)

## 2016-03-09 MED ORDER — INSULIN ASPART 100 UNIT/ML ~~LOC~~ SOLN
10.0000 [IU] | Freq: Once | SUBCUTANEOUS | Status: AC
Start: 2016-03-09 — End: 2016-03-09
  Administered 2016-03-09: 10 [IU] via SUBCUTANEOUS

## 2016-03-09 MED ORDER — FUROSEMIDE 10 MG/ML IJ SOLN
40.0000 mg | Freq: Once | INTRAMUSCULAR | Status: AC
  Administered 2016-03-09: 40 mg via INTRAVENOUS
  Filled 2016-03-09: qty 4

## 2016-03-09 MED ORDER — SODIUM CHLORIDE 0.9 % IV SOLN
INTRAVENOUS | Status: DC
  Administered 2016-03-09: 3 [IU]/h via INTRAVENOUS
  Filled 2016-03-09: qty 2.5

## 2016-03-09 MED ORDER — POTASSIUM CHLORIDE 10 MEQ/100ML IV SOLN
10.0000 meq | INTRAVENOUS | Status: AC
  Administered 2016-03-09 (×4): 10 meq via INTRAVENOUS
  Filled 2016-03-09 (×4): qty 100

## 2016-03-09 MED ORDER — HYDRALAZINE HCL 20 MG/ML IJ SOLN
5.0000 mg | INTRAMUSCULAR | Status: DC | PRN
  Administered 2016-03-09: 20 mg via INTRAVENOUS
  Administered 2016-03-09: 10 mg via INTRAVENOUS
  Administered 2016-03-10 – 2016-03-11 (×3): 20 mg via INTRAVENOUS
  Filled 2016-03-09 (×5): qty 1

## 2016-03-09 MED ORDER — INSULIN ASPART 100 UNIT/ML ~~LOC~~ SOLN
0.0000 [IU] | Freq: Three times a day (TID) | SUBCUTANEOUS | Status: DC
  Administered 2016-03-09: 15 [IU] via SUBCUTANEOUS

## 2016-03-09 MED ORDER — HALOPERIDOL LACTATE 5 MG/ML IJ SOLN
2.0000 mg | Freq: Once | INTRAMUSCULAR | Status: AC
  Administered 2016-03-09: 2 mg via INTRAVENOUS
  Filled 2016-03-09: qty 1

## 2016-03-09 MED ORDER — BISACODYL 5 MG PO TBEC: 10.0000 mg | DELAYED_RELEASE_TABLET | Freq: Every day | ORAL | Status: DC | PRN

## 2016-03-09 MED ORDER — METOPROLOL TARTRATE 5 MG/5ML IV SOLN
5.0000 mg | Freq: Four times a day (QID) | INTRAVENOUS | Status: DC
  Administered 2016-03-09 – 2016-03-11 (×9): 5 mg via INTRAVENOUS
  Filled 2016-03-09 (×9): qty 5

## 2016-03-09 MED ORDER — FUROSEMIDE 10 MG/ML IJ SOLN
40.0000 mg | Freq: Four times a day (QID) | INTRAMUSCULAR | Status: AC
Start: 2016-03-09 — End: 2016-03-09
  Administered 2016-03-09 (×2): 40 mg via INTRAVENOUS
  Filled 2016-03-09 (×2): qty 4

## 2016-03-09 MED ORDER — INSULIN ASPART 100 UNIT/ML ~~LOC~~ SOLN: 0.0000 [IU] | Freq: Every day | SUBCUTANEOUS | Status: DC

## 2016-03-09 NOTE — Progress Notes (Signed)
All moring meds were held due to pt being on strict NPO. Dr. Erlinda Hong ordered Speech evaluation.   Speech evaluated pt & pt is able to have crushed meds in applesauce and pureed meals.   Morning meds were given after speech evaluated pt.

## 2016-03-09 NOTE — Progress Notes (Signed)
PULMONARY / CRITICAL CARE MEDICINE   Name: [name] MRN: [id number] DOB: 30-Jul-1941    ADMISSION DATE:  02/28/2016 CONSULTATION DATE:  03/01/16  REFERRING MD:  Dr. Leonie Man / Stroke MD   CHIEF COMPLAINT:  CVA, Acute Respiratory Failure   HISTORY OF PRESENT ILLNESS:  75 y/o M with PMH of DM, diabetic neuropathy, GERD, HLD, HTN, aortic stenosis who was admitted on 7/26 for right popliteal bypass graft in the setting of chronic right lower extremity wound.  On 7/26 she underwent a right below the knee popliteal artery to peroneal artery bypass. Postoperatively she was treated with heparin and transitioned to Plavix on 7/27. JP drain postoperatively had high output. She was evaluated by physical therapy and recommended for skilled nursing facility placement. Postoperatively there were concern for weak doppler flow. On 7/28, she was noted to have worsening of peroneal signal consistent with early failure and anemia requiring transfusion of 2 units PRBCs. Palliative amputation was recommended.  On re-rounding, the patient was noted to have altered mental status with right-sided weakness and left gaze. Code Stroke called for further evaluation.  Upon*team evaluation, she was noted to have incomprehensible speech and moaning as well as right hemiplegia. She was emergently taken to neuro-interventional radiology where she underwent procedure for revascularization (stent assisted angioplasty) of her left ICA in the setting of thrombus.  The patient was intubated and returned to ICU for further observation.  PCCM consulted for ICU assistance  reconsult 8/4- after NGT removed, vocal cord exp wheezing, distress  SUBJECTIVE:  Comfortably breathing overnight anasarca  VITAL SIGNS: BP (!) 198/58   Pulse 79   Temp 99.1 F (37.3 C) (Oral)   Resp (!) 39   Ht 5\' 5"  (1.651 m)   Wt 112.1 kg (247 lb 2.2 oz)   SpO2 100%   BMI 41.13 kg/m   HEMODYNAMICS:    VENTILATOR SETTINGS: FiO2 (%):  [4 %] 4  %  INTAKE / OUTPUT:  Intake/Output Summary (Last 24 hours) at 03/09/16 1011 Last data filed at 03/08/16 1200  Gross per 24 hour  Intake              360 ml  Output                0 ml  Net              360 ml     PHYSICAL EXAMINATION:  General: no distress HENT: NCAT OP clear PULM: diminished breath sounds bases, normal effort CV: RRR, no clear murmur for me GI: BS+, soft MSK: normal bulk, tone Derm: massive anasarca Neuro: aphasic  LABS:  BMET  Recent Labs Lab 03/08/16 0539 03/08/16 1635 03/09/16 0614  NA 147* 142 144  K 5.7* 4.5 4.4  CL 107 104 104  CO2 34* 29 31  BUN 25* 25* 22*  CREATININE 0.56 0.65 0.52  GLUCOSE 87 192* 167*    Electrolytes  Recent Labs Lab 03/04/16 0550  03/05/16 0600 03/06/16 0500  03/08/16 0539 03/08/16 1635 03/09/16 0614  CALCIUM 8.3*  < > 8.6* 9.0  < > 9.0 8.7* 9.2  MG 2.2  --  2.3 2.2  --   --   --   --   PHOS 2.6  --  2.5 2.4*  --   --   --   --   < > = values in this interval not displayed.  CBC  Recent Labs Lab 03/06/16 0500 03/07/16 0422 03/08/16 0539  WBC 13.3* 14.6*  15.2*  HGB 8.1* 8.4* 8.6*  HCT 27.0* 28.6* 28.8*  PLT 188 225 186    Coag's No results for input(s): APTT, INR in the last 168 hours.  Sepsis Markers  Recent Labs Lab 03/03/16 0512  PROCALCITON 0.28    ABG  Recent Labs Lab 03/04/16 1322 03/08/16 1500  PHART 7.539* 7.406  PCO2ART 32.3* 56.9*  PO2ART 142.0* 91.3    Liver Enzymes  Recent Labs Lab 03/06/16 0500 03/07/16 0422 03/08/16 0539  AST 17 17 51*  ALT 28 30 37  ALKPHOS 62 52 51  BILITOT 0.3 0.4 1.7*  ALBUMIN 2.2* 2.3* 2.2*    Cardiac Enzymes  Recent Labs Lab 03/04/16 1752 03/04/16 2330  TROPONINI 0.10* 0.11*    Glucose  Recent Labs Lab 03/08/16 1544 03/08/16 1919 03/08/16 2119 03/08/16 2340 03/09/16 0349 03/09/16 0737  GLUCAP 204* 157* 135* 144* 168* 163*    Imaging Dg Chest Port 1 View  Result Date: 03/09/2016 CLINICAL DATA:  Bilateral  pleural effusions; pt coughing up mucous this am. EXAM: PORTABLE CHEST 1 VIEW COMPARISON:  03/08/2016 FINDINGS: Stable enlarged cardiac silhouette. There is bibasilar effusions and atelectasis. Upper lungs are clear. Central venous congestion is mild. IMPRESSION: 1. No interval change. 2. Cardiomegaly, effusions, basilar atelectasis. Electronically Signed   By: Suzy Bouchard M.D.   On: 03/09/2016 09:08   Dg Chest Port 1 View  Result Date: 03/08/2016 CLINICAL DATA:  Sudden onset of respiratory distress today, shortness of breath. Status post popliteal to tibial bypass graft on February 28, 2016 an intravascular carotid stent placement on March 01, 2016. History of aortic stenosis EXAM: PORTABLE CHEST 1 VIEW COMPARISON:  Portable chest x-ray of March 08, 2016 at 10:02 a.m. FINDINGS: The lungs are adequately inflated. The interstitial markings remain increased. The pulmonary vascularity is engorged in the cardiac silhouette is enlarged. Bilateral pleural effusions are present obscuring the hemidiaphragms. The observed bony thorax is unremarkable. IMPRESSION: CHF with pulmonary interstitial edema and bilateral pleural effusions. There has not been significant interval change since the study of earlier today. Electronically Signed   By: David  Martinique M.D.   On: 03/08/2016 15:26     STUDIES:  CTA Head / Neck 7/28: At risk brain in the L MCA territory showing normal cerebral blood volume but decreased cerebral flow CT Cerebral Perfusion 7/28: near occlusion of the L ICA at the origin Abd X-ray 7/28: Enteric catheter with tip overlying the peripyloric region. Port CXR 7/28: Endotracheal tube in good position. Enteric feeding tube coursing below diaphragm. Right internal jugular central venous catheter in place. Low lung volumes. Bilateral basilar opacities. MRI/MRA of Brain 7/29: Acute patchy ischemic infarcts left basal ganglia & internal capsule with extension into periventricular white matter of left corona  radiata/centrum semi-ovale. No hemorrhage or mass effect. Scattered small punctate cortical & white matter infarcts involving bilateral cerebral hemispheres & rites or bowel movement. No associated mass or hemorrhage. 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 No large vessel occlusion within intracranial circulation. Short segment severe stenosis left ICA. Severe stenosis proximal right PCA. Multifocal atheromatous irregularity involving anterior & posterior circulations without severe or correctable stenosis. TTE 7/29: Moderate concentric LVH. EF 55-60%. Grade 2 diastolic dysfunction. Cannot exclude wall motion abnormality. LA moderately dilated & RA normal in size. RV normal in size and function. Mild aortic regurgitation without stenosis. No mitral regurgitation or stenosis. No pulmonic stenosis. No tricuspid regurgitation. Small circumferential pericardial effusion.  Port CXR 7/30: Enteric feeding tube coursing below diaphragm. Endotracheal tube  in good position. Bilateral basilar opacities unchanged. Right internal jugular central venous catheter unchanged in position. 7/30 CT abdo - no bleed  MICROBIOLOGY: MRSA PCR 7/24:  Negative Tracheal Asp Ctx 7/29: few GNR GNC and GPC pairs>>>  ANTIBIOTICS: None  SIGNIFICANT EVENTS: 7/26 - Admit for R popliteal artery to peroneal artery bypass 7/28 - Code Stroke called for R sided weakness, to neuro IR with near occlusion of L ICA s/p revascularization    8/4 NGT removed, passed swallow, developed focal cord wheezing, distress  LINES/TUBES: OETT 7/28 >> 8/1 R IJ Dual Lumen 7/28 >>  Foley 7/26 >> L Fem Sheat 7/28 - 7/29 L Fem Art Line 7/28 - 7/29 R UE Midline IV 7/28 >>  ASSESSMENT / PLAN:  PULMONARY A: Acute Hypoxic Respiratory Failure - improving, favor acute pulmonary edema but also aspiration risk Extubated 8/1 8/4 ASP event / cord irritation after NGT removed Edema, r/o effusion rt > due to volume resuscitation P:    Add additional lasix and KCL replacement today for three total doses Strict NPO SLP evluation Weaning FiO2 for Sat >92% Albuterol Neb prn  CV: Hypertension: P: BP goal per neurology guidelines Restart oral after SLP evaluation Change coreg to metoprolol until passes SLP evaluation Continue prn hydralazline and labetalol  NEUROLOGIC A:   L ICA CVA - S/P Revascularization [id number] to be embolic but source unclear. She is s/p Left ICA stent.   P:   Management per Neuro IR/Stroke Service ASA 325mg  daily Plavix 75mg  daily Lyrica VT q8hr, when able PT consult    GASTROINTESTINAL A:   Obesity  Severe dysphagia  P:   NPO SLP evaluation  FAMILY  - Updates:NO family at bedside 8/5  - Inter-disciplinary family meet or Palliative Care meeting due by:  8/4  PCCM will see again 8/7 unless needed on 8/6  Roselie Awkward, MD [place] [facility] Pager: [phone] Cell: [phone] After 3pm or if no response, call [phone]  03/09/2016 10:11 AM

## 2016-03-09 NOTE — Progress Notes (Signed)
STROKE TEAM PROGRESS NOTE   SUBJECTIVE (INTERVAL HISTORY) No family at bedside. Patient had more SOB and wheeze after taking off NG tube yesterday. CXR no change, received extra dose of lasix and kept NPO. This morning continues to do better, CXR slight improvement. Na 144 and K normal. Will give more lasix today. Speech evaluation pending.   OBJECTIVE Temp:  [97.4 F (36.3 C)-99.4 F (37.4 C)] 98.4 F (36.9 C) (08/05 0400) Pulse Rate:  [68-80] 79 (08/05 0800) Cardiac Rhythm: Normal sinus rhythm (08/04 2000) Resp:  [16-39] 39 (08/05 0800) BP: (135-194)/(40-73) 194/59 (08/05 0800) SpO2:  [96 %-100 %] 100 % (08/05 0800) FiO2 (%):  [4 %] 4 % (08/04 2000) Weight:  [112.1 kg (247 lb 2.2 oz)] 112.1 kg (247 lb 2.2 oz) (08/05 0600)  CBC:   Recent Labs Lab 03/04/16 0550  03/05/16 0600  03/07/16 0422 03/08/16 0539  WBC 10.4  < > 12.1*  < > 14.6* 15.2*  NEUTROABS 7.8*  --  9.5*  --   --   --   HGB 7.8*  < > 7.8*  < > 8.4* 8.6*  HCT 24.5*  < > 25.5*  < > 28.6* 28.8*  MCV 91.4  < > 92.7  < > 95.7 95.7  PLT 147*  < > 158  < > 225 186  < > = values in this interval not displayed.  Basic Metabolic Panel:   Recent Labs Lab 03/05/16 0600 03/06/16 0500  03/08/16 1635 03/09/16 0614  NA 148* 154*  < > 142 144  K 3.5 3.3*  < > 4.5 4.4  CL 113* 113*  < > 104 104  CO2 31 36*  < > 29 31  GLUCOSE 238* 187*  < > 192* 167*  BUN 44* 37*  < > 25* 22*  CREATININE 0.88 0.75  < > 0.65 0.52  CALCIUM 8.6* 9.0  < > 8.7* 9.2  MG 2.3 2.2  --   --   --   PHOS 2.5 2.4*  --   --   --   < > = values in this interval not displayed.  Lipid Panel:     Component Value Date/Time   CHOL 109 03/02/2016 0600   TRIG 71 03/06/2016 0500   HDL 32 (L) 03/02/2016 0600   CHOLHDL 3.4 03/02/2016 0600   VLDL 24 03/02/2016 0600   LDLCALC 53 03/02/2016 0600   HgbA1c:  Lab Results  Component Value Date   HGBA1C 7.7 (H) 03/02/2016   Urine Drug Screen:     Component Value Date/Time   LABOPIA NONE DETECTED  02/12/2014 0110   COCAINSCRNUR NONE DETECTED 02/12/2014 0110   LABBENZ NONE DETECTED 02/12/2014 0110   AMPHETMU NONE DETECTED 02/12/2014 0110   THCU NONE DETECTED 02/12/2014 0110   LABBARB NONE DETECTED 02/12/2014 0110      IMAGING Dr. Erlinda Hong has personally reviewed the radiological images below and agree with the radiology interpretations.  Ct Head Code Stroke Wo Contrast 03/01/2016 Age related volume loss with mild periventricular small vessel disease. No acute infarct evident currently. No edema or hemorrhage. No hyperdense vessel evident. Basal ganglia calcification is physiologic.   Ct Angio Head and Neck W Or Wo Contrast 03/01/2016 Near occlusion of the left ICA at the origin. Reduced caliber of the left cervical internal carotid artery because of reduced inflow. 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 Stenoses estimated at 50% bilaterally. Probable serial stenoses. No M1 stenosis and no missing M2  branches on the left.    Ct Cerebral Perfusion W Contrast 03/01/2016 At risk brain in the left MCA territory showing normal cerebral blood volume but decreased cerebral blood flow, mean transit time and time to peak. No evidence of completed infarction.   Cerebral Angiogram 03/01/2016 S./P bilateral  Common  Carotid arteriogram followed by by endovascular revascularization of  preocclusive lt ICA stenosis prox with a thrombus with stent assisted angioplasty and 4.5 mg of IA Integrelin TICI flow MCA distribution  Ct Head Wo Contrast 03/01/2016 No change or acute finding following left carotid artery stent.   MRI HEAD  03/02/2016 1. Acute patchy ischemic infarcts involving left basal ganglia and internal capsule, with extension into periventricular white matter of the left corona radiata/centrum semi ovale. No associated hemorrhage or mass effect.  2. Additional scattered small punctate cortical and white matter infarcts involving the  bilateral cerebral hemispheres and right cerebellum as above. No associated hemorrhage or mass effect.  3. 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 No associated edema.   MRA HEAD 03/02/2016 1. No large vessel occlusion within the intracranial circulation.  2. Short-segment severe stenosis at the supraclinoid left ICA.  3. Severe stenosis proximal right PCA with attenuated flow distally.  4. Additional multi focal atheromatous irregularity involving the anterior posterior circulations as above without severe or correctable stenosis.  5. Hypoplastic/absent left A1 segment, likely accounting for the diminutive left ICA is compared to the right.   Transthoracic echocardiogram 03/02/2016 - Left ventricle: The cavity size was normal. There was moderate concentric hypertrophy. Systolic function was normal. The estimated ejection fraction was in the range of 55% to 60%. Although no diagnostic regional wall motion abnormality was identified, this possibility cannot be completely excluded on the basis of this study. Features are consistent with a pseudonormal left ventricular filling pattern, with concomitant abnormal relaxation and increased filling pressure (grade 2 diastolic dysfunction). - Aortic valve: There was mild stenosis. Valve area (VTI): 1.28 cm^2. Valve area (Vmax): 1.18 cm^2. Valve area (Vmean): 1.04 cm^2. - Left atrium: The atrium was moderately dilated. - Pericardium, extracardiac: A small pericardial effusion was identified circumferential to the heart. Impressions: - There is an echo free space posterior the left ventricle that most likely represents a left pleural effusion, but image quality is poor. Cannot entirely exclude a loculated pericardial efusion. Correlate with radiological studies.  LE venous doppler - Bilateral:  No evidence of DVT, superficial thrombosis, or Baker's Cyst.  CXR  03/07/2016 Bilateral effusions and  lower lung volume loss, slowly worsening over the last week. 03/08/2016 Stable moderate to large bilateral pleural effusions and associated dense passive atelectasis and/or pneumonia in the lower lobes, left greater than right. No new abnormalities.  03/09/2016 1. No interval change. 2. Cardiomegaly, effusions, basilar atelectasis.    PHYSICAL EXAM  Temp:  [97.4 F (36.3 C)-99.4 F (37.4 C)] 98.4 F (36.9 C) (08/05 0400) Pulse Rate:  [68-80] 79 (08/05 0800) Resp:  [16-39] 39 (08/05 0800) BP: (135-194)/(40-73) 194/59 (08/05 0800) SpO2:  [96 %-100 %] 100 % (08/05 0800) FiO2 (%):  [4 %] 4 % (08/04 2000) Weight:  [112.1 kg (247 lb 2.2 oz)] 112.1 kg (247 lb 2.2 oz) (08/05 0600)  General - Well nourished, well developed, mild respiratory distress.  Ophthalmologic - Fundi not visualized due to noncooperation.  Cardiovascular - Regular rate and rhythm.  Extremities - right foot proximal to big toe wound with dressing  Mental Status -  Awake alert, orientated to self, age, place, year and people, not orientated to month. Broken sentences due to SOB, able to following commands, moderate perseveration on repetition, able to name 3/4. Right side neglect much improved.  Cranial Nerves II - XII - II - blinking to visual threat bilaterally. III, IV, VI - left gaze preference but able to cross midline with right gaze. V - Facial sensation intact bilaterally. VII - right facial droop. VIII - Hearing & vestibular intact bilaterally. X - Palate elevates symmetrically. XI - Chin turning & shoulder shrug intact bilaterally. XII - Tongue protrusion intact.  Motor Strength - The patient's strength was 3+/5 RUE proximal and 3/5 distally, BLE 2/5 proximal and 3/5 distal, LUE 4+/5 with purposeful movement.  Bulk was normal and fasciculations were absent.   Motor Tone - Muscle tone was assessed at the neck and appendages and was decreased on the RUE.  Reflexes - The patient's reflexes were 1+ in all  extremities and she had no pathological reflexes.  Sensory - Light touch, temperature/pinprick were assessed and were symmetrical.    Coordination - The patient had normal movements in the left hand with no ataxia or dysmetria.  Tremor was absent.  Gait and Station - not tested    ASSESSMENT/PLAN [name] is a 75 y.o. female with history of hypertension, hyperlipidemia, diabetes with diabetic neuropathy, aortic stenosis, and a nonhealing ischemic right foot ulcer status post right lower extremity bypass graft surgery performed 02/28/2016 who developed aphasia, right hemiplegia, and left-sided gaze preference in hopsital. She did not receive IV t-PA due to recent surgery. CT angio showed a near occlusion of the L ICA , perfusion showed brain at risk. She was taken to IR where she received stent assisted angioplasty and IA Integrelin. A Left ICA stent was placed.  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  Severe stenosis proximal right PCA.  CTA head and neck - near occlusion L ICA, R ICA 50%, intracranial stenosis  Cerebral angio - left ICA clot s/p ICA stent  2D Echo - EF 55-60%. No cardiac source of emboli identified.   LE venous doppler negative for DVT  Plan for CTV pelvis to look for pelvic venous thrombosis once more stabilized and able to lie flat  Consider TEE/loop later if CTV pelvis unremarkable  LDL - 53  HgbA1c 8.1  VTE prophylaxis - SCDs DIET - DYS 1 Room service appropriate? Yes; Fluid consistency: Nectar Thick. Has tube feedings and CoreTrack. Passed swallow - eating breakfast. RN to monitor intake - if  75% or greater, will d/c TF. Some cough with breakfast->has ST reassess  aspirin 81 mg daily prior to admission, now on aspirin 325 mg daily and clopidogrel 75 mg daily   Patient/family counseled to be compliant with her antithrombotic medications  Ongoing aggressive stroke risk factor management  Therapy recommendations: SNF  Disposition: Pending (lived at home, since fall 2 yrs ago wheelchair bound at baseline could stand for xfer, at [facility] in the past)  Acute Respiratory distress  Intubated prior to procedure  Extubated 8/1 without difficulty  CXR large b/l pleural effusion stable.   Lasix additional  dose today for SOB  Hypernatremia   Na 148->154->154->147->142->144  Likely due to lasix and diuresis   free water for gentle hydration, now off  Pt passed po and encourage po fluid  Continue lasix therapy as Na improved  Anemia   received 2 unit PRBCs 7/27, 1u 7/28, 1u 7/30.   Hgb 6.6->8.2->7.8->8.1->8.6  Close monitoring  Continue DAPT for left ICA stent  CT abd - no source of bleeding  Fecal occult blood - negative  Dysphagia   Improved and passed swallow  Difficulty with thin liquid and changed to nectar thick  Speech re-evaluation agree with nectar thick  TF d/c'ed  Avoid aspiration  Hypertension  Blood pressure run high due to no po BP meds given NPO overnight  BP goal normotensive  Resume po BP meds after speech evaluation - coreg 25 bid, cozaar 100mg  daily, amlodipine 10mg    Lasix additional dose today  Labetalol and hydralazine PRN  Hyperlipidemia  Home meds:  Lipitor 20 mg daily resumed in hospital  LDL 53, goal < 70  Continue statin at discharge  Diabetes  HgbA1c 8.1, goal < 7.0  Uncontrolled - glucoses running high  SSI  decreased levemir to 25 bid   Changed SSI to ac & HS  Appreciate DM education RN assistance   Other Stroke Risk Factors  Advanced age  Morbid Obesity, Body mass index is 41.13 kg/m.,  recommend weight loss, diet and exercise as appropriate   PVD - RLE popliteal artery to peroneal artery bypass 02/28/2016  Other Active Problems  Hyperkalemia 4.2->5.7->4.4  Leukocytosis 13.3->14.6->15.2  Improved BUN and creatinine - 1.28 -> 0.56 -> 0.52  Right foot wound - VVS on board. treating with Santyl. granulation noted  Elevated troponin 0.1, EKG unchanged - T wave inversion lateral limb and precordial leads, not new, no change in management  Hospital day # 10   This patient is critically ill due to bilateral embolic stroke, left ICA stenosis s/p stent, respiratory distress, large pleural effusion, anemiaand at significant risk of neurological worsening, death form recurrent stroke, hemorrhagic transformation, in-stent restenosis, shock and respiratory failure. This patient's care requires constant monitoring of vital signs, hemodynamics, respiratory and cardiac monitoring, review of multiple databases, neurological assessment, discussion with family, other specialists and medical decision making of high complexity. I spent 56minutes of neurocritical care time in the care of this patient.  Rosalin Hawking, MD PhD Stroke Neurology 03/09/2016 8:08 AM    To contact Stroke Continuity provider, please refer to [email or web address] After hours, contact General Neurology

## 2016-03-09 NOTE — Progress Notes (Signed)
   VASCULAR SURGERY ASSESSMENT & PLAN:  10 Days Post-Op s/p: Right popliteal to peroneal bypass (vein). Graft is patent  10 Days Post-op: L ICA stent by NeuroIR for B CVA  Small amount of drainage (serous) from right leg incision- I have ordered dressing changes.   SUBJECTIVE: Says that she has not had a BM  PHYSICAL EXAM: Vitals:   03/09/16 0600 03/09/16 0651 03/09/16 0700 03/09/16 0800  BP: (!) 179/66 (!) 193/53 (!) 191/53 (!) 194/59  Pulse: 77 77 76 79  Resp: 18 (!) 21 (!) 22 (!) 39  Temp:      TempSrc:      SpO2: 100% 100% 100% 100%  Weight: 247 lb 2.2 oz (112.1 kg)     Height:       Right foot warm Wound on right 1st metatarsal is clean Small amount of drainage right leg incision  LABS: Lab Results  Component Value Date   WBC 15.2 (H) 03/08/2016   HGB 8.6 (L) 03/08/2016   HCT 28.8 (L) 03/08/2016   MCV 95.7 03/08/2016   PLT 186 03/08/2016   Lab Results  Component Value Date   CREATININE 0.52 03/09/2016    CBG (last 3)   Recent Labs  03/08/16 2340 03/09/16 0349 03/09/16 0737  GLUCAP 144* 168* 163*    Active Problems:   PAD (peripheral artery disease) (HCC)   Cerebrovascular accident (CVA) due to thrombosis of precerebral artery (HCC)   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

## 2016-03-09 NOTE — Progress Notes (Signed)
eLink Physician-Brief Progress Note Patient Name: [name] DOB: 08-25-1940 MRN: [id number]   Date of Service  03/09/2016  HPI/Events of Note  Notified by bedside nurse of persistent hyperglycemia despite subcutaneous insulin with additional doses. Patient received evening dose of Levemir as well.   eICU Interventions  1. Discontinuing subcutaneous insulin 2. Initiating insulin drip protocol      Intervention Category Intermediate Interventions: Hyperglycemia - evaluation and treatment  Tera Partridge 03/09/2016, 11:03 PM

## 2016-03-09 NOTE — Progress Notes (Signed)
[place] Progress Note Patient Name: WAYNETTA WENNERSTROM DOB: 1941/01/27 MRN: [id number]   Date of Service  03/09/2016  HPI/Events of Note  Bedside nurse reports patient complaining of dyspnea. Camera check on patient shows some mild agitation with mild tachypnea but normal saturation on nasal cannula oxygen. Patient is oriented to year, president, and the fact that she is in a hospital but reports she is still having some trouble breathing. She also endorses some chest tightness despite having just received a nebulizer treatment. No rash reports patient overall sounds clear without wheezing on auscultation. Patient requesting to see some previous staff member who was "tall and wearing green" scrubs. She reports there was some device replaced over her head which helped her breathing. Suspect some element of mild delirium.   eICU Interventions  1. Continue ICU monitoring 2. Haldol 2 mg IV 1      Intervention Category Major Interventions: Delirium, psychosis, severe agitation - evaluation and management  Tera Partridge 03/09/2016, 9:35 PM

## 2016-03-09 NOTE — Progress Notes (Signed)
eLink Physician-Brief Progress Note Patient Name: ADELAY OLKOWSKI DOB: [birth year] MRN: [id number]   Date of Service  03/09/2016  HPI/Events of Note  Patient w/ hyperglycemia. Missed PM dose of Levemir on 8/4 & started diet mid-day on 8/4. Did have AM dose of Levemir.  eICU Interventions  1. Switching Accu-Checks to qAC & HS w/ Moderate SSI coverage 2. Continue Levemir bid at current dose.     Intervention Category Intermediate Interventions: Hyperglycemia - evaluation and treatment  Tera Partridge 03/09/2016, 8:22 PM

## 2016-03-09 NOTE — Progress Notes (Signed)
Speech Language Pathology Treatment: Dysphagia  Patient Details Name: BOBI KLEINBERG MRN: [id number] DOB: [birth year] Today's Date: 03/09/2016 Time: [id number] SLP Time Calculation (min) (ACUTE ONLY): 30 min  Assessment / Plan / Recommendation Clinical Impression  Patient seen at the request of nursing following removal of her DHT due to concerns for an aspiration event during the removal.  Chart review indicated that the patient has had intermittent low grade temps and that her lungs per charting are questionably worse.  Most recent chest xray is showing bibasilar effusions and atelectasis.  Lengthy meal observation was completed and the patient was noted to have a delayed throat clear and increased wheeze given thin liquids.  Signs/symptoms of aspiration were not noted for dysphagia 1 or nectar thick liquids.   Recommend resume dysphagia 1 diet with nectar thick liquids with FULL ASSISTANCE for all intake to ensure adherence to swallowing precautions.  Nursing instructed to make the patient NPO should there be any change in her respiratory status.  ST will continue to follow for therapeutic diet tolerance.      HPI HPI: 75 year old admitted for bypass graft popliteal to peroneal for foot ulcer. Pt developed post stroke symptoms and MRI revealed acute patchy ischemic infarcts involving left basal ganglia and internal capsule, scattered small punctate cortical and white matter infarcts involving the bilateral cerebral hemispheres and right cerebellum. Intubated 7/28-8/1. No prior ST documentation.   Per nursing patient had an aspiration event when nasal feeding tube was removed and has been wheezing since.        SLP Plan  Continue with current plan of care     Recommendations  Diet recommendations: Dysphagia 1 (puree);Nectar-thick liquid Liquids provided via: Cup;No straw Medication Administration: Crushed with puree Supervision: Staff to assist with self feeding;Full supervision/cueing for  compensatory strategies Compensations: Slow rate;Small sips/bites;Monitor for anterior loss Postural Changes and/or Swallow Maneuvers: Seated upright 90 degrees             Oral Care Recommendations: Oral care BID;Oral care before and after PO Follow up Recommendations: Skilled Nursing facility Plan: Continue with current plan of care     [place], MA, [place] [facility] SLP [phone] Lamar Sprinkles 03/09/2016, 10:39 AM

## 2016-03-10 DIAGNOSIS — E119 Type 2 diabetes mellitus without complications: Secondary | ICD-10-CM

## 2016-03-10 DIAGNOSIS — I5032 Chronic diastolic (congestive) heart failure: Secondary | ICD-10-CM

## 2016-03-10 LAB — CBC
HCT: 32.9 % — ABNORMAL LOW (ref 36.0–46.0)
Hemoglobin: 9.8 g/dL — ABNORMAL LOW (ref 12.0–15.0)
MCH: 27.9 pg (ref 26.0–34.0)
MCHC: 29.8 g/dL — AB (ref 30.0–36.0)
MCV: 93.7 fL (ref 78.0–100.0)
Platelets: 273 10*3/uL (ref 150–400)
RBC: 3.51 MIL/uL — ABNORMAL LOW (ref 3.87–5.11)
RDW: 15.9 % — AB (ref 11.5–15.5)
WBC: 23.4 10*3/uL — ABNORMAL HIGH (ref 4.0–10.5)

## 2016-03-10 LAB — BASIC METABOLIC PANEL
Anion gap: 5 (ref 5–15)
BUN: 29 mg/dL — AB (ref 6–20)
CALCIUM: 9.3 mg/dL (ref 8.9–10.3)
CO2: 35 mmol/L — ABNORMAL HIGH (ref 22–32)
Chloride: 104 mmol/L (ref 101–111)
Creatinine, Ser: 0.7 mg/dL (ref 0.44–1.00)
GFR calc Af Amer: >60 mL/min (ref >60)
GLUCOSE: 201 mg/dL — AB (ref 65–99)
Potassium: 3.8 mmol/L (ref 3.5–5.1)
Sodium: 144 mmol/L (ref 135–145)

## 2016-03-10 LAB — GLUCOSE, CAPILLARY
GLUCOSE-CAPILLARY: 306 mg/dL — AB (ref 65–99)
GLUCOSE-CAPILLARY: 239 mg/dL — AB (ref 65–99)
GLUCOSE-CAPILLARY: 235 mg/dL — AB (ref 65–99)
GLUCOSE-CAPILLARY: 158 mg/dL — AB (ref 65–99)
GLUCOSE-CAPILLARY: 160 mg/dL — AB (ref 65–99)
GLUCOSE-CAPILLARY: 163 mg/dL — AB (ref 65–99)
GLUCOSE-CAPILLARY: 209 mg/dL — AB (ref 65–99)
GLUCOSE-CAPILLARY: 138 mg/dL — AB (ref 65–99)
GLUCOSE-CAPILLARY: 202 mg/dL — AB (ref 65–99)
Glucose-Capillary: 139 mg/dL — ABNORMAL HIGH (ref 65–99)
Glucose-Capillary: 163 mg/dL — ABNORMAL HIGH (ref 65–99)
Glucose-Capillary: 356 mg/dL — ABNORMAL HIGH (ref 65–99)
Glucose-Capillary: 83 mg/dL (ref 65–99)
Glucose-Capillary: 92 mg/dL (ref 65–99)
Glucose-Capillary: 99 mg/dL (ref 65–99)

## 2016-03-10 MED ORDER — INSULIN GLARGINE 100 UNIT/ML ~~LOC~~ SOLN
10.0000 [IU] | SUBCUTANEOUS | Status: DC
Start: 2016-03-10 — End: 2016-03-10
  Administered 2016-03-10: 10 [IU] via SUBCUTANEOUS
  Filled 2016-03-10: qty 0.1

## 2016-03-10 MED ORDER — INSULIN GLARGINE 100 UNIT/ML ~~LOC~~ SOLN
15.0000 [IU] | Freq: Every day | SUBCUTANEOUS | Status: DC
  Administered 2016-03-10 – 2016-03-11 (×2): 15 [IU] via SUBCUTANEOUS
  Filled 2016-03-10 (×3): qty 0.15

## 2016-03-10 MED ORDER — DEXTROSE 5 % IV SOLN
INTRAVENOUS | Status: DC
  Administered 2016-03-10: 04:00:00 via INTRAVENOUS

## 2016-03-10 MED ORDER — POTASSIUM CHLORIDE CRYS ER 20 MEQ PO TBCR
40.0000 meq | EXTENDED_RELEASE_TABLET | Freq: Once | ORAL | Status: AC
  Administered 2016-03-10: 40 meq via ORAL
  Filled 2016-03-10: qty 2

## 2016-03-10 MED ORDER — FUROSEMIDE 10 MG/ML IJ SOLN
80.0000 mg | Freq: Four times a day (QID) | INTRAMUSCULAR | Status: AC
  Administered 2016-03-10 – 2016-03-11 (×3): 80 mg via INTRAVENOUS
  Filled 2016-03-10 (×3): qty 8

## 2016-03-10 MED ORDER — INSULIN ASPART 100 UNIT/ML ~~LOC~~ SOLN
2.0000 [IU] | SUBCUTANEOUS | Status: DC
  Administered 2016-03-10: 6 [IU] via SUBCUTANEOUS

## 2016-03-10 MED ORDER — INSULIN ASPART 100 UNIT/ML ~~LOC~~ SOLN
0.0000 [IU] | SUBCUTANEOUS | Status: DC
  Administered 2016-03-10: 7 [IU] via SUBCUTANEOUS
  Administered 2016-03-10: 3 [IU] via SUBCUTANEOUS
  Administered 2016-03-11: 4 [IU] via SUBCUTANEOUS
  Administered 2016-03-11: 11 [IU] via SUBCUTANEOUS
  Administered 2016-03-11: 4 [IU] via SUBCUTANEOUS
  Administered 2016-03-11: 7 [IU] via SUBCUTANEOUS
  Administered 2016-03-11 – 2016-03-12 (×2): 3 [IU] via SUBCUTANEOUS
  Administered 2016-03-12: 7 [IU] via SUBCUTANEOUS

## 2016-03-10 MED ORDER — FUROSEMIDE 10 MG/ML IJ SOLN: 40.0000 mg | Freq: Four times a day (QID) | INTRAMUSCULAR | Status: DC

## 2016-03-10 NOTE — Progress Notes (Signed)
STROKE TEAM PROGRESS NOTE   History on Admission   [name] is an 75 y.o. female who is status post right lower extremity bypass surgery. This morning it was noted that she was a phasic and not moving the right side of her body. The stroke team was notified.  [name] was taken immediately to CT for further evaluation. The initial CT was unremarkable. Given the significant nature of the neurological deficit it was felt that CT angiogram would be of benefit. There was some delay due to the fact that it was extremely difficult to obtain IV access. Multiple attempts were made and multiple services were contacted.  During this period of time but this was being carefully monitored. It was noted that she seems slightly less responsive over a period of time. It was also noted that she was breathing in a much more shallow matter. It was requested that the crash cart be brought into the room. Anesthesia was consult for further evaluation of need for airway protection. Anesthesia felt that it was prudent to intubate the patient at that time.  After intubation Lamona Curl underwent CT angiogram and CT perfusion studies. 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 There was also the presence of some stenosis in the supraclinoid portion of the internal carotid artery. The perfusion studies indicated the potential for salvage of the area of ischemia in the right middle cerebral artery distribution.  Consultation was ongoing with other members of the stroke and interventional neuro radiology team. It was felt that [facility] it would likely obtain the most benefit from interventional radiology.  [name] was taken to the interventional neuro radiology suite. She underwent extraction of a clot which was located at the origin of the right left internal carotid artery. She subsequently underwent placement of a stent at that area.  After the procedure but  this and was brought to the neuro intensive care unit. The family was notified of her status intermittently during the    SUBJECTIVE (INTERVAL HISTORY) No acute events overnight.     OBJECTIVE Temp:  [97.5 F (36.4 C)-100 F (37.8 C)] 97.7 F (36.5 C) (08/06 0800) Pulse Rate:  [66-97] 75 (08/06 0603) Cardiac Rhythm: Normal sinus rhythm (08/06 0400) Resp:  [16-38] 16 (08/06 0603) BP: (103-199)/(36-135) 117/66 (08/06 0603) SpO2:  [94 %-100 %] 100 % (08/06 0603) Weight:  [113 kg (249 lb 1.9 oz)] 113 kg (249 lb 1.9 oz) (08/06 0500)  CBC:   Recent Labs Lab 03/04/16 0550  03/05/16 0600  03/08/16 0539 03/10/16 0348  WBC 10.4  < > 12.1*  < > 15.2* 23.4*  NEUTROABS 7.8*  --  9.5*  --   --   --   HGB 7.8*  < > 7.8*  < > 8.6* 9.8*  HCT 24.5*  < > 25.5*  < > 28.8* 32.9*  MCV 91.4  < > 92.7  < > 95.7 93.7  PLT 147*  < > 158  < > 186 273  < > = values in this interval not displayed.  Basic Metabolic Panel:   Recent Labs Lab 03/05/16 0600 03/06/16 0500  03/09/16 0614 03/10/16 0348  NA 148* 154*  < > 144 144  K 3.5 3.3*  < > 4.4 3.8  CL 113* 113*  < > 104 104  CO2 31 36*  < > 31 35*  GLUCOSE 238* 187*  < > 167* 201*  BUN 44* 37*  < >  22* 29*  CREATININE 0.88 0.75  < > 0.52 0.70  CALCIUM 8.6* 9.0  < > 9.2 9.3  MG 2.3 2.2  --   --   --   PHOS 2.5 2.4*  --   --   --   < > = values in this interval not displayed.  Lipid Panel:     Component Value Date/Time   CHOL 109 03/02/2016 0600   TRIG 71 03/06/2016 0500   HDL 32 (L) 03/02/2016 0600   CHOLHDL 3.4 03/02/2016 0600   VLDL 24 03/02/2016 0600   LDLCALC 53 03/02/2016 0600   HgbA1c:  Lab Results  Component Value Date   HGBA1C 7.7 (H) 03/02/2016   Urine Drug Screen:     Component Value Date/Time   LABOPIA NONE DETECTED 02/12/2014 0110   COCAINSCRNUR NONE DETECTED 02/12/2014 0110   LABBENZ NONE DETECTED 02/12/2014 0110   AMPHETMU NONE DETECTED 02/12/2014 0110   THCU NONE DETECTED 02/12/2014 0110   LABBARB NONE  DETECTED 02/12/2014 0110      IMAGING Dr. Erlinda Hong has personally reviewed the radiological images below and agree with the radiology interpretations.  Ct Head Code Stroke Wo Contrast 03/01/2016 Age related volume loss with mild periventricular small vessel disease. No acute infarct evident currently. No edema or hemorrhage. No hyperdense vessel evident. Basal ganglia calcification is physiologic.   Ct Angio Head and Neck W Or Wo Contrast 03/01/2016 Near occlusion of the left ICA at the origin. Reduced caliber of the left cervical internal carotid artery because of reduced inflow. 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 Stenoses estimated at 50% bilaterally. Probable serial stenoses. No M1 stenosis and no missing M2 branches on the left.    Ct Cerebral Perfusion W Contrast 03/01/2016 At risk brain in the left MCA territory showing normal cerebral blood volume but decreased cerebral blood flow, mean transit time and time to peak. No evidence of completed infarction.   Cerebral Angiogram 03/01/2016 S./P bilateral  Common  Carotid arteriogram followed by by endovascular revascularization of  preocclusive lt ICA stenosis prox with a thrombus with stent assisted angioplasty and 4.5 mg of IA Integrelin TICI flow MCA distribution  Ct Head Wo Contrast 03/01/2016 No change or acute finding following left carotid artery stent.   MRI HEAD  03/02/2016 1. Acute patchy ischemic infarcts involving left basal ganglia and internal capsule, with extension into periventricular white matter of the left corona radiata/centrum semi ovale. No associated hemorrhage or mass effect.  2. Additional scattered small punctate cortical and white matter infarcts involving the bilateral cerebral hemispheres and right cerebellum as above. No associated hemorrhage or mass effect.  3. 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 No associated edema.   MRA HEAD 03/02/2016 1. No large vessel occlusion within the intracranial circulation.  2. Short-segment severe stenosis at the supraclinoid left ICA.  3. Severe stenosis proximal right PCA with attenuated flow distally.  4. Additional multi focal atheromatous irregularity involving the anterior posterior circulations as above without severe or correctable stenosis.  5. Hypoplastic/absent left A1 segment, likely accounting for the diminutive left ICA is compared to the right.   Transthoracic echocardiogram 03/02/2016 - Left ventricle: The cavity size was normal. There was moderate concentric hypertrophy. Systolic function was normal. The estimated ejection fraction was in the range of 55% to 60%. Although no diagnostic regional wall motion abnormality was identified, this possibility cannot be completely excluded on the  basis of this study. Features are consistent with a pseudonormal left ventricular filling pattern, with concomitant abnormal relaxation and increased filling pressure (grade 2 diastolic dysfunction). - Aortic valve: There was mild stenosis. Valve area (VTI): 1.28 cm^2. Valve area (Vmax): 1.18 cm^2. Valve area (Vmean): 1.04 cm^2. - Left atrium: The atrium was moderately dilated. - Pericardium, extracardiac: A small pericardial effusion was identified circumferential to the heart. Impressions: - There is an echo free space posterior the left ventricle that most likely represents a left pleural effusion, but image quality is poor. Cannot entirely exclude a loculated pericardial efusion. Correlate with radiological studies.  LE venous doppler - Bilateral:  No evidence of DVT, superficial thrombosis, or Baker's Cyst.  CXR  03/07/2016 Bilateral effusions and lower lung volume loss, slowly worsening over the last week. 03/08/2016 Stable moderate to large bilateral pleural effusions and associated dense passive atelectasis and/or pneumonia in the lower  lobes, left greater than right. No new abnormalities.  03/09/2016 1. No interval change. 2. Cardiomegaly, effusions, basilar atelectasis.    PHYSICAL EXAM  Temp:  [97.5 F (36.4 C)-100 F (37.8 C)] 97.7 F (36.5 C) (08/06 0800) Pulse Rate:  [66-97] 75 (08/06 0603) Resp:  [16-38] 16 (08/06 0603) BP: (103-199)/(36-135) 117/66 (08/06 0603) SpO2:  [94 %-100 %] 100 % (08/06 0603) Weight:  [113 kg (249 lb 1.9 oz)] 113 kg (249 lb 1.9 oz) (08/06 0500)  General - Well nourished, well developed, mild respiratory distress.  Ophthalmologic - Fundi not visualized due to noncooperation.  Cardiovascular - Regular rate and rhythm.  Extremities - right foot proximal to big toe wound with dressing  Mental Status -  Awake alert, orientated to self, age, place, year and people, not orientated to month. Broken sentences due to SOB, able to following commands, moderate perseveration on repetition, able to name 3/4. Right side neglect much improved.  Cranial Nerves II - XII - II - blinking to visual threat bilaterally. III, IV, VI - left gaze preference but able to cross midline with right gaze. V - Facial sensation intact bilaterally. VII - right facial droop. VIII - Hearing & vestibular intact bilaterally. X - Palate elevates symmetrically. XI - Chin turning & shoulder shrug intact bilaterally. XII - Tongue protrusion intact.  Motor Strength - The patient's strength was 3+/5 RUE proximal and 3/5 distally, BLE 2/5 proximal and 3/5 distal, LUE 4+/5 with purposeful movement.  Bulk was normal and fasciculations were absent.   Motor Tone - Muscle tone was assessed at the neck and appendages and was decreased on the RUE.  Reflexes - The patient's reflexes were 1+ in all extremities and she had no pathological reflexes.  Sensory - Light touch, temperature/pinprick were assessed and were symmetrical.    Coordination - The patient had normal movements in the left hand with no ataxia or dysmetria.   Tremor was absent.  Gait and Station - not tested    ASSESSMENT/PLAN Ms. LUVINA MAGALLON is a 75 y.o. female with history of hypertension, hyperlipidemia, diabetes with diabetic neuropathy, aortic stenosis, and a nonhealing ischemic right foot ulcer status post right lower extremity bypass graft surgery performed 02/28/2016 who developed aphasia, right hemiplegia, and left-sided gaze preference in hopsital. She did not receive IV t-PA due to recent surgery. CT angio showed a near occlusion of the L ICA , perfusion showed brain at risk. She was taken to IR where she received stent assisted angioplasty and IA Integrelin. A Left ICA stent was placed.  Stroke:  Bilateral infarcts  felt to be embolic from unclear source, concerning for DVT. 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  Severe stenosis proximal right PCA.  CTA head and neck - near occlusion L ICA, R ICA 50%, intracranial stenosis  Cerebral angio - left ICA clot s/p ICA stent  2D Echo - EF 55-60%. No cardiac source of emboli identified.   LE venous doppler negative for DVT  Plan for CTV pelvis to look for pelvic venous thrombosis once more stabilized and able to lie flat  Consider TEE/loop later if CTV pelvis unremarkable  LDL - 53  HgbA1c 8.1  VTE prophylaxis - SCDs DIET - DYS 1 Room service appropriate? Yes; Fluid consistency: Nectar Thick. Has tube feedings and CoreTrack. Passed swallow - eating breakfast. RN to monitor intake - if 75% or greater, will d/c TF. Some cough with breakfast->has ST reassess  aspirin 81 mg daily prior to admission, now on aspirin 325 mg daily and clopidogrel 75 mg daily   Patient/family counseled to be compliant with her  antithrombotic medications  Ongoing aggressive stroke risk factor management  Therapy recommendations: SNF  Disposition: Pending (lived at home, since fall 2 yrs ago wheelchair bound at baseline could stand for xfer, at [facility] in the past)  Acute Respiratory distress  Intubated prior to procedure  Extubated 8/1 without difficulty  CXR large b/l pleural effusion stable.   Lasix additional dose today for SOB  Hypernatremia   Na 148->154->154->147->142->144  Likely due to lasix and diuresis   free water for gentle hydration, now off  Pt passed po and encourage po fluid  Continue lasix therapy as Na improved  Anemia   received 2 unit PRBCs 7/27, 1u 7/28, 1u 7/30.   Hgb 6.6->8.2->7.8->8.1->8.6  Close monitoring  Continue DAPT for left ICA stent  CT abd - no source of bleeding  Fecal occult blood - negative  Dysphagia   Improved and passed swallow  Difficulty with thin liquid and changed to nectar thick  Speech re-evaluation agree with nectar thick  TF d/c'ed  Avoid aspiration  Hypertension  Blood pressure run high due to no po BP meds given NPO overnight  BP goal normotensive  Resume po BP meds after speech evaluation - coreg 25 bid, cozaar 100mg  daily, amlodipine 10mg    Lasix additional dose today  Labetalol and hydralazine PRN  Hyperlipidemia  Home meds:  Lipitor 20 mg daily resumed in hospital  LDL 53, goal < 70  Continue statin at discharge  Diabetes  HgbA1c 8.1, goal < 7.0  Uncontrolled - glucoses running high  SSI  decreased levemir to 25 bid   Changed SSI to ac & HS  Appreciate DM education RN assistance   Other Stroke Risk Factors  Advanced age  Morbid Obesity, Body mass index is 41.46 kg/m., recommend weight loss, diet and exercise as appropriate   PVD - RLE popliteal artery to peroneal artery bypass 02/28/2016  Other Active Problems  Hyperkalemia 4.2->5.7->4.4 -> 3.8  Leukocytosis 13.3->14.6->15.2 ->23.4  (afebrile)- will recommend pan culture   Improved BUN and creatinine - 1.28 -> 0.56 -> 0.52 -> 0.7  Right foot wound - VVS on board. treating with Santyl. granulation noted  Elevated troponin 0.1, EKG unchanged - T wave inversion lateral limb  and precordial leads, not new, no change in management  Hospital day # 11    - Will need to follow up pan culture for leukocytosis, afebrile and no diarrhea   - Looks overall like she is doing well, only on 2L [place]. CXR from yesterday still shows b/l effusions, will follow up with critical care. Na is stable.  - May consider loop recorder upon dc      To contact Stroke Continuity provider, please refer to [email or web address] After hours, contact General Neurology

## 2016-03-10 NOTE — Progress Notes (Signed)
   VASCULAR SURGERY ASSESSMENT & PLAN:  11 Days Post-Op s/p: Right popliteal to peroneal bypass (vein). Graft is patent  9 Days Post-op: L ICA stent by NeuroIR for B CVA  Small amount of drainage (serous) from right leg incision. Continue dressing changes  SUBJECTIVE: c/o SOB  PHYSICAL EXAM: Vitals:   03/10/16 1200 03/10/16 1300 03/10/16 1400 03/10/16 1500  BP: (!) 161/48 (!) 162/74 (!) 143/68 (!) 154/41  Pulse: 73 78 81 80  Resp: (!) 28 (!) 22 (!) 23 (!) 27  Temp: 97.5 F (36.4 C)     TempSrc: Oral     SpO2: 100% 99% 97% 98%  Weight:      Height:       Wound on right foot is unchanged. Serous drainage from right leg incision.   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

## 2016-03-10 NOTE — Progress Notes (Signed)
SLP Cancellation Note  Patient Details Name: [name] MRN: [id number] DOB: April 20, 1941   Cancelled treatment:       Reason Eval/Treat Not Completed: Other (comment). Pt refused PO intake, stated she felt too SOB. Will continue to follow for diet tolerance check.   Kern Reap, [place], CCC-SLP 03/10/2016, 3:29 PM [phone]

## 2016-03-10 NOTE — Progress Notes (Signed)
PULMONARY / CRITICAL CARE MEDICINE   Name: [name] MRN: [id number] DOB: 1941-04-12    ADMISSION DATE:  02/28/2016 CONSULTATION DATE:  03/01/16  REFERRING MD:  Dr. Leonie Man / Stroke MD   CHIEF COMPLAINT:  CVA, Acute Respiratory Failure   HISTORY OF PRESENT ILLNESS:  75 y/o M with PMH of DM, diabetic neuropathy, GERD, HLD, HTN, aortic stenosis who was admitted on 7/26 for right popliteal bypass graft in the setting of chronic right lower extremity wound.  On 7/26 she underwent a right below the knee popliteal artery to peroneal artery bypass. Postoperatively she was treated with heparin and transitioned to Plavix on 7/27. JP drain postoperatively had high output. She was evaluated by physical therapy and recommended for skilled nursing facility placement. Postoperatively there were concern for weak doppler flow. On 7/28, she was noted to have worsening of peroneal signal consistent with early failure and anemia requiring transfusion of 2 units PRBCs. Palliative amputation was recommended.  On re-rounding, the patient was noted to have altered mental status with right-sided weakness and left gaze. Code Stroke called for further evaluation.  Upon*team evaluation, she was noted to have incomprehensible speech and moaning as well as right hemiplegia. She was emergently taken to neuro-interventional radiology where she underwent procedure for revascularization (stent assisted angioplasty) of her left ICA in the setting of thrombus.  The patient was intubated and returned to ICU for further observation.  PCCM consulted for ICU assistance  reconsult 8/4- after NGT removed, vocal cord exp wheezing, distress  SUBJECTIVE:  Complains of dyspnea Was placed on insulin drip overnight  VITAL SIGNS: BP (!) 161/48   Pulse 73   Temp 97.5 F (36.4 C) (Oral)   Resp (!) 28   Ht 5\' 5"  (1.651 m)   Wt 113 kg (249 lb 1.9 oz)   SpO2 100%   BMI 41.46 kg/m   HEMODYNAMICS:    VENTILATOR SETTINGS:    INTAKE  / OUTPUT:  Intake/Output Summary (Last 24 hours) at 03/10/16 1422 Last data filed at 03/10/16 1300  Gross per 24 hour  Intake          1481.46 ml  Output                0 ml  Net          1481.46 ml     PHYSICAL EXAMINATION:  General: anxious HENT: NCAT OP clear PULM: CTA B normal effort CV: RRR, no clear murmur for me GI: BS+, soft MSK: normal bulk, tone Derm: massive anasarca Neuro: conversant today, says she is short of breath  LABS:  BMET  Recent Labs Lab 03/08/16 1635 03/09/16 0614 03/10/16 0348  NA 142 144 144  K 4.5 4.4 3.8  CL 104 104 104  CO2 29 31 35*  BUN 25* 22* 29*  CREATININE 0.65 0.52 0.70  GLUCOSE 192* 167* 201*    Electrolytes  Recent Labs Lab 03/04/16 0550  03/05/16 0600 03/06/16 0500  03/08/16 1635 03/09/16 0614 03/10/16 0348  CALCIUM 8.3*  < > 8.6* 9.0  < > 8.7* 9.2 9.3  MG 2.2  --  2.3 2.2  --   --   --   --   PHOS 2.6  --  2.5 2.4*  --   --   --   --   < > = values in this interval not displayed.  CBC  Recent Labs Lab 03/07/16 0422 03/08/16 0539 03/10/16 0348  WBC 14.6* 15.2* 23.4*  HGB 8.4*  8.6* 9.8*  HCT 28.6* 28.8* 32.9*  PLT 225 186 273    Coag's No results for input(s): APTT, INR in the last 168 hours.  Sepsis Markers No results for input(s): LATICACIDVEN, PROCALCITON, O2SATVEN in the last 168 hours.  ABG  Recent Labs Lab 03/04/16 1322 03/08/16 1500  PHART 7.539* 7.406  PCO2ART 32.3* 56.9*  PO2ART 142.0* 91.3    Liver Enzymes  Recent Labs Lab 03/06/16 0500 03/07/16 0422 03/08/16 0539  AST 17 17 51*  ALT 28 30 37  ALKPHOS 62 52 51  BILITOT 0.3 0.4 1.7*  ALBUMIN 2.2* 2.3* 2.2*    Cardiac Enzymes  Recent Labs Lab 03/04/16 1752 03/04/16 2330  TROPONINI 0.10* 0.11*    Glucose  Recent Labs Lab 03/10/16 0554 03/10/16 0657 03/10/16 0737 03/10/16 0905 03/10/16 1013 03/10/16 1133  GLUCAP 139* 99 83 92 163* 209*    Imaging No results found.   STUDIES:  CTA Head / Neck 7/28: At  risk brain in the L MCA territory showing normal cerebral blood volume but decreased cerebral flow CT Cerebral Perfusion 7/28: near occlusion of the L ICA at the origin Abd X-ray 7/28: Enteric catheter with tip overlying the peripyloric region. Port CXR 7/28: Endotracheal tube in good position. Enteric feeding tube coursing below diaphragm. Right internal jugular central venous catheter in place. Low lung volumes. Bilateral basilar opacities. MRI/MRA of Brain 7/29: Acute patchy ischemic infarcts left basal ganglia & internal capsule with extension into periventricular white matter of left corona radiata/centrum semi-ovale. No hemorrhage or mass effect. Scattered small punctate cortical & white matter infarcts involving bilateral cerebral hemispheres & rites or bowel movement. No associated mass or hemorrhage. 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 No large vessel occlusion within intracranial circulation. Short segment severe stenosis left ICA. Severe stenosis proximal right PCA. Multifocal atheromatous irregularity involving anterior & posterior circulations without severe or correctable stenosis. TTE 7/29: Moderate concentric LVH. EF 55-60%. Grade 2 diastolic dysfunction. Cannot exclude wall motion abnormality. LA moderately dilated & RA normal in size. RV normal in size and function. Mild aortic regurgitation without stenosis. No mitral regurgitation or stenosis. No pulmonic stenosis. No tricuspid regurgitation. Small circumferential pericardial effusion.  Port CXR 7/30: Enteric feeding tube coursing below diaphragm. Endotracheal tube in good position. Bilateral basilar opacities unchanged. Right internal jugular central venous catheter unchanged in position. 7/30 CT abdo - no bleed  MICROBIOLOGY: MRSA PCR 7/24:  Negative Tracheal Asp Ctx 7/29: few GNR GNC and GPC pairs>>>  ANTIBIOTICS: None  SIGNIFICANT EVENTS: 7/26 - Admit for R popliteal artery to peroneal artery  bypass 7/28 - Code Stroke called for R sided weakness, to neuro IR with near occlusion of L ICA s/p revascularization    8/4 NGT removed, passed swallow, developed focal cord wheezing, distress  LINES/TUBES: OETT 7/28 >> 8/1 R IJ Dual Lumen 7/28 >>  Foley 7/26 >> L Fem Sheat 7/28 - 7/29 L Fem Art Line 7/28 - 7/29 R UE Midline IV 7/28 >>  ASSESSMENT / PLAN:  PULMONARY A: Acute Hypoxic Respiratory Failure - improving, favor acute pulmonary edema but also aspiration risk Extubated 8/1 8/4 ASP event / cord irritation after NGT removed Edema, r/o effusion rt > due to volume resuscitation P:   Lasix again today x3 doses with KCL replacement Strict NPO SLP evluation Weaning FiO2 for Sat >92% Albuterol Neb prn  CV: Hypertension: P: BP goal per neurology guidelines Restart oral after SLP evaluation Continue IV metoprolol Continue prn hydralazline and labetalol  NEUROLOGIC  A:   L ICA CVA - S/P Revascularization [id number] to be embolic but source unclear. She is s/p Left ICA stent.   P:   Management per Neuro IR/Stroke Service ASA 325mg  daily Plavix 75mg  daily Lyrica VT q8hr, when able PT consult    ENDOCRINE A:   DM2 P:   Insulin glargine 15 U qHS Increase SSI q4 to resistant scale  Roselie Awkward, MD [place] PCCM Pager: [phone] Cell: [phone] After 3pm or if no response, call [phone]  03/10/2016 2:22 PM

## 2016-03-11 DIAGNOSIS — J9600 Acute respiratory failure, unspecified whether with hypoxia or hypercapnia: Secondary | ICD-10-CM

## 2016-03-11 LAB — GLUCOSE, CAPILLARY
GLUCOSE-CAPILLARY: 116 mg/dL — AB (ref 65–99)
GLUCOSE-CAPILLARY: 254 mg/dL — AB (ref 65–99)
Glucose-Capillary: 125 mg/dL — ABNORMAL HIGH (ref 65–99)
Glucose-Capillary: 172 mg/dL — ABNORMAL HIGH (ref 65–99)
Glucose-Capillary: 272 mg/dL — ABNORMAL HIGH (ref 65–99)

## 2016-03-11 LAB — CBC
HCT: 31.7 % — ABNORMAL LOW (ref 36.0–46.0)
HEMOGLOBIN: 9.8 g/dL — AB (ref 12.0–15.0)
MCH: 28.7 pg (ref 26.0–34.0)
MCHC: 30.9 g/dL (ref 30.0–36.0)
MCV: 92.7 fL (ref 78.0–100.0)
Platelets: 261 10*3/uL (ref 150–400)
RBC: 3.42 MIL/uL — AB (ref 3.87–5.11)
RDW: 16.3 % — ABNORMAL HIGH (ref 11.5–15.5)
WBC: 22.6 10*3/uL — ABNORMAL HIGH (ref 4.0–10.5)

## 2016-03-11 LAB — BASIC METABOLIC PANEL
ANION GAP: 6 (ref 5–15)
BUN: 22 mg/dL — ABNORMAL HIGH (ref 6–20)
CHLORIDE: 100 mmol/L — AB (ref 101–111)
CO2: 36 mmol/L — AB (ref 22–32)
CREATININE: 0.66 mg/dL (ref 0.44–1.00)
Calcium: 9.1 mg/dL (ref 8.9–10.3)
GFR calc non Af Amer: >60 mL/min (ref >60)
GLUCOSE: 128 mg/dL — AB (ref 65–99)
Potassium: 3.7 mmol/L (ref 3.5–5.1)
Sodium: 142 mmol/L (ref 135–145)

## 2016-03-11 MED ORDER — METOPROLOL TARTRATE 25 MG/10 ML ORAL SUSPENSION
50.0000 mg | Freq: Two times a day (BID) | ORAL | Status: DC
  Administered 2016-03-11 – 2016-03-13 (×4): 50 mg via ORAL
  Filled 2016-03-11 (×4): qty 20

## 2016-03-11 MED ORDER — FUROSEMIDE 10 MG/ML IJ SOLN
80.0000 mg | Freq: Once | INTRAMUSCULAR | Status: AC
  Administered 2016-03-11: 80 mg via INTRAVENOUS
  Filled 2016-03-11: qty 8

## 2016-03-11 MED ORDER — RESOURCE THICKENUP CLEAR PO POWD
ORAL | Status: DC | PRN
  Filled 2016-03-11: qty 125

## 2016-03-11 NOTE — Progress Notes (Signed)
STROKE TEAM PROGRESS NOTE   SUBJECTIVE (INTERVAL HISTORY) Her husband is at the bedside. She is more awake today, working with physical therapy. Stroke-wise, she remains stable.  OBJECTIVE Temp:  [97.5 F (36.4 C)-98.4 F (36.9 C)] 97.7 F (36.5 C) (08/07 0800) Pulse Rate:  [66-84] 75 (08/07 0600) Cardiac Rhythm: Normal sinus rhythm (08/07 0000) Resp:  [20-32] 23 (08/07 0600) BP: (137-193)/(35-74) 137/45 (08/07 0600) SpO2:  [97 %-100 %] 99 % (08/07 0600) Weight:  [112.7 kg (248 lb 7.3 oz)] 112.7 kg (248 lb 7.3 oz) (08/07 0600)  CBC:   Recent Labs Lab 03/05/16 0600  03/10/16 0348 03/11/16 0405  WBC 12.1*  < > 23.4* 22.6*  NEUTROABS 9.5*  --   --   --   HGB 7.8*  < > 9.8* 9.8*  HCT 25.5*  < > 32.9* 31.7*  MCV 92.7  < > 93.7 92.7  PLT 158  < > 273 261  < > = values in this interval not displayed.  Basic Metabolic Panel:   Recent Labs Lab 03/05/16 0600 03/06/16 0500  03/10/16 0348 03/11/16 0405  NA 148* 154*  < > 144 142  K 3.5 3.3*  < > 3.8 3.7  CL 113* 113*  < > 104 100*  CO2 31 36*  < > 35* 36*  GLUCOSE 238* 187*  < > 201* 128*  BUN 44* 37*  < > 29* 22*  CREATININE 0.88 0.75  < > 0.70 0.66  CALCIUM 8.6* 9.0  < > 9.3 9.1  MG 2.3 2.2  --   --   --   PHOS 2.5 2.4*  --   --   --   < > = values in this interval not displayed.    IMAGING No results found.    PHYSICAL EXAM General - Well nourished, well developed, mild respiratory distress.  Ophthalmologic - Fundi not visualized due to noncooperation.  Cardiovascular - Regular rate and rhythm.  Extremities - right foot proximal to big toe wound with dressing  Mental Status -  Awake alert, orientated to self, age, place, year and people, not orientated to month. Broken sentences due to SOB, able to following commands, moderate perseveration on repetition, able to name 3/4. Right side neglect much improved.  Cranial Nerves II - XII - II - blinking to visual threat bilaterally. III, IV, VI - left gaze  preference but able to cross midline with right gaze. V - Facial sensation intact bilaterally. VII - right facial droop. VIII - Hearing & vestibular intact bilaterally. X - Palate elevates symmetrically. XI - Chin turning & shoulder shrug intact bilaterally. XII - Tongue protrusion intact.  Motor Strength - The patient's strength was 3+/5 RUE proximal and 3/5 distally, BLE 2/5 proximal and 3/5 distal, LUE 4+/5 with purposeful movement.  Bulk was normal and fasciculations were absent.   Motor Tone - Muscle tone was assessed at the neck and appendages and was decreased on the RUE.  Reflexes - The patient's reflexes were 1+ in all extremities and she had no pathological reflexes.  Sensory - Light touch, temperature/pinprick were assessed and were symmetrical.    Coordination - The patient had normal movements in the left hand with no ataxia or dysmetria.  Tremor was absent.  Gait and Station - not tested   ASSESSMENT/PLAN [name] is a 75 y.o. female with history of hypertension, hyperlipidemia, diabetes with diabetic neuropathy, aortic stenosis, and a nonhealing ischemic right foot ulcer status post right lower extremity  bypass graft surgery performed 02/28/2016 who developed aphasia, right hemiplegia, and left-sided gaze preference in hopsital. She did not receive IV t-PA due to recent surgery. CT angio showed a near occlusion of the L ICA , perfusion showed brain at risk. She was taken to IR where she received stent assisted angioplasty and IA Integrelin. A Left ICA stent was placed.  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  Severe stenosis proximal right  PCA.  CTA head and neck - near occlusion L ICA, R ICA 50%, intracranial stenosis  Cerebral angio - left ICA clot s/p ICA stent  2D Echo - EF 55-60%. No cardiac source of emboli identified.   LE venous doppler negative for DVT  LDL - 53  HgbA1c 8.1  VTE prophylaxis - SCDs DIET - DYS 1 Room service appropriate? Yes; Fluid consistency: Nectar Thick. Initially passed with a thin liquid. Reassessed after cough and pt to nectar thick liquids. Received 2 OJs and 2 ice creams for breakfast - was ordered a carb modified diet - this was reinforced  aspirin 81 mg daily prior to admission, now on aspirin 325 mg daily and clopidogrel 75 mg daily. Continue both at discharge.  Patient/family counseled to be compliant with her antithrombotic medications  Ongoing aggressive stroke risk factor management  Therapy recommendations: SNF  Disposition: Pending (lived at home, since fall 2 yrs ago wheelchair bound at baseline could stand for xfer, at [facility] in the past)  Acute Respiratory distress Acute Pulmonary Edema  Intubated prior to procedure  Extubated 8/1 without difficulty  CXR large b/l pleural effusion stable.   Lasix additional dose today for SOB  Hypernatremia, resovled  Na 142  Likely due to lasix and diuresis   free water for gentle hydration, now off  Pt passed po and encourage po fluid  Continue lasix therapy as Na improved  Anemia, improving   received 2 unit PRBCs 7/27, 1u 7/28, 1u 7/30.   Hgb 6.6->9.8  Close monitoring  Continue DAPT for left ICA stent  CT abd - no source of bleeding  Fecal occult blood - negative  Dysphagia   Improved and passed swallow  Difficulty with thin liquid and changed to nectar thick  Speech re-evaluation agree with nectar thick  TF d/c'ed  Avoid aspiration  Hypertension  BP goal normotensive  On po BP meds - lopressor 50 bid, cozaar 100mg  daily, amlodipine 10mg    Labetalol and hydralazine  PRN  normalizing  Hyperlipidemia  Home meds:  Lipitor 20 mg daily resumed in hospital  LDL 53, goal < 70  Continue statin at discharg  Diabetes type II  HgbA1c 8.1, goal < 7.0  Uncontrolled - glucoses running high  Started on insulin drip protocol over the weekend, now on lantus 15u daily (was on levemir, max 35 bid, down to 25 bid)  SSI q4h  Other Stroke Risk Factors  Advanced age  Morbid Obesity, Body mass index is 41.35 kg/m., recommend weight loss, diet and exercise as appropriate   PVD - RLE popliteal artery to peroneal artery bypass 02/28/2016  Other Active Problems  Hyperkalemia, resolved 4.2->5.7->4.4 -> 3.7  Leukocytosis 13.3->14.6->15.2 ->22.6 (afebrile)- will recommend pan culture - resp  cx normal flora  Improved BUN and creatinine - 1.28 -> 0.66  Right foot wound - VVS on board. treating with Santyl. granulation noted  Elevated troponin 0.1, EKG unchanged - T wave inversion lateral limb and precordial leads, not new, no change in management  Hospital day # 12   Neurologic status remains stable. Recommend transfer back to VVS service for ongoing management.  Meigs Alton for Pager information 03/11/2016 3:02 PM   I have personally examined this patient, reviewed notes, independently viewed imaging studies, participated in medical decision making and plan of care. I have made any additions or clarifications directly to the above note. Agree with note above. Plan to transfer the patient out of ICU today. Transfer service back to vascular surgery. Greater than 50% time during this 35 minute visit was spent on counseling and coordination of care about her stroke, risk and prevention. Continue aspirin and Plavix for stroke prevention given recent carotid stent. Discussed with Dr. Bridgett Larsson. Plan to transfer the patient back to the vascular surgery service. Stroke team will sign off at the present time. Kindly call for questions if  any.  Antony Contras, MD Medical Director [facility] Pager: [phone] 03/11/2016 5:06 PM     To contact Stroke Continuity provider, please refer to [email or web address] After hours, contact General Neurology

## 2016-03-11 NOTE — Progress Notes (Signed)
   Daily Progress Note  Assessment/Planning: POD #12s/p R pop to peroneal bypass with ips rGSV,  POD #10s/p L ICA stenting by NeuroIR, B CVA, Ansarca, severe protein malnutrition, severe supraclinoid L ICA stenosis   Significant neurologic deficits still evident  Increased serosang drainage from right calf: decompression fluid from right calf, dry dressing q8 hour to right calf  Continue wound care to right foot  Suspect again R CFA to peroneal bypass likely occluded  Rehab at SNF on cleared by Neuro  Subjective  - 10 Days Post-Op  Less talkative today per family  Objective Vitals:   03/11/16 1150 03/11/16 1200 03/11/16 1300 03/11/16 1400  BP:  (!) 140/37 (!) 164/45 (!) 136/47  Pulse:  72 84 78  Resp:  (!) 22 (!) 21 (!) 21  Temp: 98.1 F (36.7 C)     TempSrc: Oral     SpO2:  99% 99% 99%  Weight:      Height:        Intake/Output Summary (Last 24 hours) at 03/11/16 1444 Last data filed at 03/11/16 0653  Gross per 24 hour  Intake              440 ml  Output                0 ml  Net              440 ml   NEURO minimally interactive, except to pain PULM  BLL rales CV  RRR GI  soft, NTND VASC  R calf: serosang drainage from drain exit and mid-incision; R foot wound: pallorous, some granulation in wound base, weak peroneal to DP signal today  Laboratory CBC    Component Value Date/Time   WBC 22.6 (H) 03/11/2016 0405   HGB 9.8 (L) 03/11/2016 0405   HCT 31.7 (L) 03/11/2016 0405   PLT 261 03/11/2016 0405    BMET    Component Value Date/Time   NA 142 03/11/2016 0405   K 3.7 03/11/2016 0405   CL 100 (L) 03/11/2016 0405   CO2 36 (H) 03/11/2016 0405   GLUCOSE 128 (H) 03/11/2016 0405   BUN 22 (H) 03/11/2016 0405   CREATININE 0.66 03/11/2016 0405   CALCIUM 9.1 03/11/2016 0405   GFRNONAA >60 03/11/2016 0405   GFRAA >60 03/11/2016 0405    Adele Barthel, MD [facility] Office: [phone] Pager: [phone]  03/11/2016, 2:44  PM

## 2016-03-11 NOTE — Care Management Important Message (Signed)
Important Message  Patient Details  Name: [name] MRN: [id number] Date of Birth: 12-31-1940   Medicare Important Message Given:  Yes    Nathen May 03/11/2016, 11:03 AM

## 2016-03-11 NOTE — Progress Notes (Signed)
Occupational Therapy Treatment Patient Details Name: [name] MRN: [id number] DOB: [birth year] Today's Date: 03/11/2016    History of present illness 75 year old admitted for bypass graft popliteal to peroneal for foot ulcer. Pt developed post stroke symptoms and MRI revealed acute patchy ischemic infarcts involving left basal ganglia and internal capsule, scattered small punctate cortical and white matter infarcts involving the bilateral cerebral hemispheres and right cerebellum. Intubated 7/28-8/1.    OT comments  Pt requires (A) for bed mobility and transfer to chair total +2. Pt at baseline able to transfer to w/c supervision level. Pt lacks awareness to deficits and delayed response to all commands. Pt requires 15-20 delay to response to commands.    Follow Up Recommendations  SNF;Supervision/Assistance - 24 hour    Equipment Recommendations  Other (comment)    Recommendations for Other Services      Precautions / Restrictions Precautions Precautions: Fall Precaution Comments: R LE drain        Mobility Bed Mobility Overal bed mobility: +2 for physical assistance;Needs Assistance Bed Mobility: Supine to Sit;Rolling Rolling: Mod assist   Supine to sit: Max assist;+2 for physical assistance     General bed mobility comments: pt poor ability power up and sustain static sitting. pt requires (A) to mobilize all extremities but able to move to command  Transfers Overall transfer level: Needs assistance Equipment used: 2 person hand held assist Transfers: Sit to/from [facility] Sit to Stand: +2 physical assistance;Mod assist Stand pivot transfers: +2 physical assistance;Max assist       General transfer comment: pt with pad used to power up Pt pivot to chair with pad no UE support to complete task    Balance Overall balance assessment: Needs assistance   Sitting balance-Leahy Scale: Poor       Standing balance-Leahy Scale: Zero                     ADL Overall ADL's : Needs assistance/impaired Eating/Feeding: Maximal assistance;Bed level Eating/Feeding Details (indicate cue type and reason): spouse feeding              Upper Body Dressing : Maximal assistance                     General ADL Comments: pt stand pivot to chair during session. pt with delayed processing for all commands      Vision                 Additional Comments: pt reports "i can't see" but able to verbalize two when shown two digits , chair when pointing to object behind therapist. Pt currently without glasses but is reporting deficits. OT to further assess vision   Perception     Praxis      Cognition   Behavior During Therapy: Flat affect Overall Cognitive Status: Impaired/Different from baseline Area of Impairment: Orientation;Attention;Safety/judgement;Awareness;Problem solving Orientation Level: Disoriented to;Situation;Time Current Attention Level: Focused Memory: Decreased short-term memory  Following Commands: Follows one step commands inconsistently;Follows one step commands with increased time Safety/Judgement: Decreased awareness of safety;Decreased awareness of deficits Awareness: Intellectual Problem Solving: Slow processing General Comments: pt requires 15-20 second delay for all responses to questions. pt able to verbalize location and name. pt was unaware of reason for admission but when cued to look at R LE verbalized "i had surgery" pt asking for spouse to answer questions several times during session.     Extremity/Trunk Assessment  Upper Extremity Assessment  Upper Extremity Assessment: Generalized weakness RUE Deficits / Details: shoulder flexion demonstrated to 80 degrees RUE Sensation: decreased light touch LUE Deficits / Details: shoulder flexion 70 degrees in supine   Lower Extremity Assessment Lower Extremity Assessment: Defer to PT evaluation   Cervical / Trunk Assessment Cervical /  Trunk Assessment: Kyphotic    Exercises     Shoulder Instructions       General Comments      Pertinent Vitals/ Pain       Pain Assessment: Faces Faces Pain Scale: Hurts little more Pain Location: sore throat Pain Descriptors / Indicators: Discomfort Pain Intervention(s): Monitored during session  Home Living Family/patient expects to be discharged to:: Skilled nursing facility Living Arrangements: Spouse/significant other   Type of Home: House Home Access: Ramped entrance     Home Layout: One level     Bathroom Shower/Tub: Walk-in shower         Home Equipment: Environmental consultant - 2 wheels;Wheelchair - [employer];Hospital bed;Toilet riser;Grab bars - toilet;Shower seat;Walker - 4 wheels   Additional Comments: Son reports that pt's spouse health is not optimal and would have trouble A pt.      Prior Functioning/Environment Level of Independence: Needs assistance  Gait / Transfers Assistance Needed: Amb with RW for short distances and w/c for longer distances/ self propelled with arms in w/c  ADL's / Homemaking Assistance Needed: Husband assists pt with bathing, dressing, getting in/out of bed, and supervises all transfers       Frequency Min 2X/week     Progress Toward Goals  OT Goals(current goals can now be found in the care plan section)     Acute Rehab OT Goals Patient Stated Goal: none stated OT Goal Formulation: With patient/family Time For Goal Achievement: 03/20/16 Potential to Achieve Goals: Fair  Plan      Co-evaluation                 End of Session Equipment Utilized During Treatment: Gait belt;Oxygen   Activity Tolerance Patient tolerated treatment well   Patient Left in chair;with call bell/phone within reach;with chair alarm set;with family/visitor present   Nurse Communication Mobility status;Precautions        Time: [id number] OT Time Calculation (min): 33 min  Charges: OT General Charges $OT Visit: 1 Procedure OT  Treatments $Self Care/Home Management : 8-22 mins $Therapeutic Activity: 8-22 mins  Parke Poisson B 03/11/2016, 11:25 AM   Jeri Modena   OTR/L Pager: [phone] Office: [phone] .

## 2016-03-11 NOTE — Progress Notes (Signed)
Physical Therapy Treatment Patient Details Name: [name] MRN: [id number] DOB: 06-Oct-1940 Today's Date: 03/11/2016    History of Present Illness 75 year old admitted for bypass graft popliteal to peroneal for foot ulcer. Pt developed post stroke symptoms and MRI revealed acute patchy ischemic infarcts involving left basal ganglia and internal capsule, scattered small punctate cortical and white matter infarcts involving the bilateral cerebral hemispheres and right cerebellum. Intubated 7/28-8/1.     PT Comments    Patient seen for activity progression. Patient declined OOB to chair as patient had spend majority of am in chair. Patient did tolerate EOB therapeutic activities for trunk control and LE strengthening. Patient with incontinence, hygiene and pericare performed. Will continue to see and progress as tolerated.   Follow Up Recommendations  SNF;Supervision/Assistance - 24 hour;Supervision for mobility/OOB     Equipment Recommendations  None recommended by PT    Recommendations for Other Services       Precautions / Restrictions Precautions Precautions: Fall Restrictions Weight Bearing Restrictions: No    Mobility  Bed Mobility Overal bed mobility: +2 for physical assistance;Needs Assistance Bed Mobility: Supine to Sit;Rolling Rolling: Mod assist   Supine to sit: Max assist;+2 for physical assistance Sit to supine: Max assist;+2 for physical assistance   General bed mobility comments: Patient able to initiate LEs movement with increased time and effort but unable to carry out task to EOB, required assist via manual positioning, use of chuck pad to rotate hips and increased physical assist to elevate trunk to upright position at EOB.   Transfers Overall transfer level: Needs assistance Equipment used:  (2 person face to face with gait belt and chuck pad) Transfers: Sit to/from Stand Sit to Stand: Max assist;+2 physical assistance         General transfer  comment: patient assist to semi-stand position to take lateral steps toward HOB. Patient with limited clearance of buttocks from bed, increased physical assist to perform.   Ambulation/Gait                 Stairs            Wheelchair Mobility    Modified Rankin (Stroke Patients Only)       Balance Overall balance assessment: Needs assistance Sitting-balance support: Feet supported (single UE supports majority of time) Sitting balance-Leahy Scale: Poor Sitting balance - Comments: able to sit EOB with increased assist initally but progressed to min guard during trunk control activity Postural control: Posterior lean   Standing balance-Leahy Scale: Zero                      Cognition Arousal/Alertness: Awake/alert Behavior During Therapy: Flat affect Overall Cognitive Status: Impaired/Different from baseline Area of Impairment: Orientation;Attention;Safety/judgement;Awareness;Problem solving Orientation Level: Disoriented to;Situation;Time Current Attention Level: Focused Memory: Decreased short-term memory Following Commands: Follows one step commands inconsistently;Follows one step commands with increased time Safety/Judgement: Decreased awareness of safety;Decreased awareness of deficits Awareness: Intellectual Problem Solving: Slow processing General Comments: continues to required increased time to process commands, decreased attention to task with environmental distraction.     Exercises Other Exercises Other Exercises: Sit and reach 4 quadrants for dynamic trunk control x5 Other Exercises: LAQs w(limited ROM) x5 Bilaterally Other Exercises: Double Leg elevation with 3-5 second hold x5 Other Exercises: Ankle pumps x5    General Comments        Pertinent Vitals/Pain Pain Assessment: Faces Faces Pain Scale: Hurts little more Pain Location: right leg  Pain Descriptors /  Indicators: Sore Pain Intervention(s): Monitored during session     Home Living                      Prior Function            PT Goals (current goals can now be found in the care plan section) Acute Rehab PT Goals Patient Stated Goal: none stated PT Goal Formulation: Patient unable to participate in goal setting Time For Goal Achievement: 03/20/16 Potential to Achieve Goals: Fair Progress towards PT goals: Progressing toward goals    Frequency  Min 3X/week    PT Plan Current plan remains appropriate    Co-evaluation             End of Session Equipment Utilized During Treatment: Gait belt;Oxygen Activity Tolerance: Patient limited by fatigue;Patient limited by pain Patient left: in bed;with call bell/phone within reach;with bed alarm set;with nursing/sitter in room (nursing in room for hygiene and pericare (pt on bedpan))     Time: 1442-1510 PT Time Calculation (min) (ACUTE ONLY): 28 min  Charges:  $Therapeutic Activity: 23-37 mins                    G CodesDuncan Dull 03/29/16, 6:25 PM Alben Deeds, [place] DPT  [phone]

## 2016-03-11 NOTE — Progress Notes (Signed)
PULMONARY / CRITICAL CARE MEDICINE   Name: [name] MRN: [id number] DOB: 11-13-1940    ADMISSION DATE:  02/28/2016 CONSULTATION DATE:  03/01/16  REFERRING MD:  Dr. Leonie Man / Stroke MD   CHIEF COMPLAINT:  CVA, Acute Respiratory Failure   HISTORY OF PRESENT ILLNESS:  75 y/o M with PMH of DM, diabetic neuropathy, GERD, HLD, HTN, aortic stenosis who was admitted on 7/26 for right popliteal bypass graft in the setting of chronic right lower extremity wound.  On 7/26 she underwent a right below the knee popliteal artery to peroneal artery bypass. Postoperatively she was treated with heparin and transitioned to Plavix on 7/27. JP drain postoperatively had high output. She was evaluated by physical therapy and recommended for skilled nursing facility placement. Postoperatively there were concern for weak doppler flow. On 7/28, she was noted to have worsening of peroneal signal consistent with early failure and anemia requiring transfusion of 2 units PRBCs. Palliative amputation was recommended.  On re-rounding, the patient was noted to have altered mental status with right-sided weakness and left gaze. Code Stroke called for further evaluation.  Upon*team evaluation, she was noted to have incomprehensible speech and moaning as well as right hemiplegia. She was emergently taken to neuro-interventional radiology where she underwent procedure for revascularization (stent assisted angioplasty) of her left ICA in the setting of thrombus.  The patient was intubated and returned to ICU for further observation.  PCCM consulted for ICU assistance  reconsult 8/4- after NGT removed, vocal cord exp wheezing, distress  SUBJECTIVE:  No c/o.  Sitting OOB in chair.    VITAL SIGNS: BP (!) 139/38   Pulse 77   Temp 98.1 F (36.7 C) (Oral)   Resp (!) 21   Ht 5\' 5"  (1.651 m)   Wt 112.7 kg (248 lb 7.3 oz)   SpO2 99%   BMI 41.35 kg/m   INTAKE / OUTPUT:  Intake/Output Summary (Last 24 hours) at 03/11/16  1158 Last data filed at 03/11/16 0653  Gross per 24 hour  Intake              470 ml  Output                0 ml  Net              470 ml     PHYSICAL EXAMINATION:  General: chronically ill appearing female, NAD OOB in chair  HENT: NCAT OP clear PULM: CTA B normal effort CV: RRR GI: BS+, soft MSK: normal bulk, tone Derm: massive anasarca Neuro: quiet, flat affect, follows commands, some anxiety per nursing   LABS:  BMET  Recent Labs Lab 03/09/16 0614 03/10/16 0348 03/11/16 0405  NA 144 144 142  K 4.4 3.8 3.7  CL 104 104 100*  CO2 31 35* 36*  BUN 22* 29* 22*  CREATININE 0.52 0.70 0.66  GLUCOSE 167* 201* 128*    Electrolytes  Recent Labs Lab 03/05/16 0600 03/06/16 0500  03/09/16 0614 03/10/16 0348 03/11/16 0405  CALCIUM 8.6* 9.0  < > 9.2 9.3 9.1  MG 2.3 2.2  --   --   --   --   PHOS 2.5 2.4*  --   --   --   --   < > = values in this interval not displayed.  CBC  Recent Labs Lab 03/08/16 0539 03/10/16 0348 03/11/16 0405  WBC 15.2* 23.4* 22.6*  HGB 8.6* 9.8* 9.8*  HCT 28.8* 32.9* 31.7*  PLT 186 273  261    Coag's No results for input(s): APTT, INR in the last 168 hours.  Sepsis Markers No results for input(s): LATICACIDVEN, PROCALCITON, O2SATVEN in the last 168 hours.  ABG  Recent Labs Lab 03/04/16 1322 03/08/16 1500  PHART 7.539* 7.406  PCO2ART 32.3* 56.9*  PO2ART 142.0* 91.3    Liver Enzymes  Recent Labs Lab 03/06/16 0500 03/07/16 0422 03/08/16 0539  AST 17 17 51*  ALT 28 30 37  ALKPHOS 62 52 51  BILITOT 0.3 0.4 1.7*  ALBUMIN 2.2* 2.3* 2.2*    Cardiac Enzymes  Recent Labs Lab 03/04/16 1752 03/04/16 2330  TROPONINI 0.10* 0.11*    Glucose  Recent Labs Lab 03/10/16 1531 03/10/16 1934 03/10/16 2329 03/11/16 0344 03/11/16 0741 03/11/16 1125  GLUCAP 138* 202* 163* 125* 116* 172*    Imaging No results found.   STUDIES:  CTA Head / Neck 7/28: At risk brain in the L MCA territory showing normal cerebral  blood volume but decreased cerebral flow CT Cerebral Perfusion 7/28: near occlusion of the L ICA at the origin Abd X-ray 7/28: Enteric catheter with tip overlying the peripyloric region. Port CXR 7/28: Endotracheal tube in good position. Enteric feeding tube coursing below diaphragm. Right internal jugular central venous catheter in place. Low lung volumes. Bilateral basilar opacities. MRI/MRA of Brain 7/29: Acute patchy ischemic infarcts left basal ganglia & internal capsule with extension into periventricular white matter of left corona radiata/centrum semi-ovale. No hemorrhage or mass effect. Scattered small punctate cortical & white matter infarcts involving bilateral cerebral hemispheres & rites or bowel movement. No associated mass or hemorrhage. 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 No large vessel occlusion within intracranial circulation. Short segment severe stenosis left ICA. Severe stenosis proximal right PCA. Multifocal atheromatous irregularity involving anterior & posterior circulations without severe or correctable stenosis. TTE 7/29: Moderate concentric LVH. EF 55-60%. Grade 2 diastolic dysfunction. Cannot exclude wall motion abnormality. LA moderately dilated & RA normal in size. RV normal in size and function. Mild aortic regurgitation without stenosis. No mitral regurgitation or stenosis. No pulmonic stenosis. No tricuspid regurgitation. Small circumferential pericardial effusion.  Port CXR 7/30: Enteric feeding tube coursing below diaphragm. Endotracheal tube in good position. Bilateral basilar opacities unchanged. Right internal jugular central venous catheter unchanged in position. 7/30 CT abdo - no bleed  MICROBIOLOGY: MRSA PCR 7/24:  Negative Tracheal Asp Ctx 7/29: few GNR GNC and GPC pairs>>>  ANTIBIOTICS: None  SIGNIFICANT EVENTS: 7/26 - Admit for R popliteal artery to peroneal artery bypass 7/28 - Code Stroke called for R sided weakness, to  neuro IR with near occlusion of L ICA s/p revascularization    8/4 NGT removed, passed swallow, developed focal cord wheezing, distress  LINES/TUBES: OETT 7/28 >> 8/1 R IJ Dual Lumen 7/28 >>  Foley 7/26 >> L Fem Sheat 7/28 - 7/29 L Fem Art Line 7/28 - 7/29 R UE Midline IV 7/28 >>  ASSESSMENT / PLAN:  PULMONARY A: Acute Hypoxic Respiratory Failure - improving, favor acute pulmonary edema but also aspiration risk Extubated 8/1 8/4 ASP event / cord irritation after NGT removed Edema, r/o effusion rt > due to volume resuscitation P:   Lasix again today x1 dose Strict NPO Po diet with restrictrions- SLP following  Weaning FiO2 for Sat >92% Albuterol Neb prn  CV: Hypertension: P: BP goal per neurology guidelines Restart oral after SLP evaluation Change IV metoprolol to PO  Amlodipine  Continue prn hydralazline and labetalol  NEUROLOGIC A:   L  ICA CVA - S/P Revascularization [id number] to be embolic but source unclear. She is s/p Left ICA stent.   P:   Management per Neuro IR/Stroke Service ASA 325mg  daily Plavix 75mg  daily Lyrica VT q8hr, when able PT/OT following    ENDOCRINE A:   DM2 P:   Insulin glargine 15 U qHS SSI   Likely ready for tx out of ICU  PCCM signing off, please call back if needed   Nickolas Madrid, NP 03/11/2016  11:58 AM Pager: [phone] or [phone]   Attending Note:  I have examined patient, reviewed labs, studies and notes. I have discussed the case with Shon Millet, and I agree with the data and plans as amended above. Recovering well s/p CVA. Tolerating a modified diet and working with PT. PCCM will sign off, please call us if we can help.    Baltazar Apo, MD, PhD 03/11/2016, 12:31 PM [facility] [phone] or if no answer [phone]

## 2016-03-11 NOTE — Progress Notes (Addendum)
Speech Language Pathology Treatment: Dysphagia  Patient Details Name: [name] MRN: [id number] DOB: 30-Dec-1940 Today's Date: 03/11/2016 Time: [id number] SLP Time Calculation (min) (ACUTE ONLY): 30 min  Assessment / Plan / Recommendation Clinical Impression  Pt seen for assessment of diet tolerance and readiness to return to thin liquids (MBS 8/3 recommended Dys 1/thin via cup, however, pt status over weekend resulted in downgrade to nectar thick liquids). RN indicates pt tolerating meds crushed in puree, and nectar thick liquids without overt difficulty. No wheezing observed or reported. Pt and husband requesting water from nursing staff and therapies. Pt was given individual ice chips without overt s/s aspiration. Despite cues to take one small sip of water via cup, pt took large bolus and exhibited immediate cough response. Teaspoon presentation of water appeared to be tolerated well, likely due to decreased bolus size and rate. Sign placed at [facility] indicating pt ok to have ice chips 1 at a time following thorough oral care, provided by nursing and/or therapy ONLY. Recommend all other liquids be thickened to nectar consistency. Husband was provided with education regarding pt's swallowing difficulty and cause of cough when drinking thin liquids.  Pt verbal responses limited to requests for more water. Voice quality noted to be at whisper level.     HPI HPI: 75 year old admitted for bypass graft popliteal to peroneal for foot ulcer. Pt developed post stroke symptoms and MRI revealed acute patchy ischemic infarcts involving left basal ganglia and internal capsule, scattered small punctate cortical and white matter infarcts involving the bilateral cerebral hemispheres and right cerebellum. Intubated 7/28-8/1. No prior ST documentation.   Per nursing patient had an aspiration event when nasal feeding tube was removed and has been wheezing since.        SLP Plan  Continue with current plan of care      Recommendations  Diet recommendations: Dysphagia 1 (puree);Nectar-thick liquid (ice chips following oral care) Liquids provided via: Cup;No straw Medication Administration: Crushed with puree Supervision: Staff to assist with self feeding;Full supervision/cueing for compensatory strategies Compensations: Slow rate;Small sips/bites;Monitor for anterior loss;Minimize environmental distractions Postural Changes and/or Swallow Maneuvers: Seated upright 90 degrees;Upright 30-60 min after meal      Oral Care Recommendations: Oral care BID;Oral care before and after PO (oral care prior to ice chips) Follow up Recommendations: Skilled Nursing facility Plan: Continue with current plan of care    [name] B. [facility], CCC-SLP [id number]   Shonna Chock 03/11/2016, 10:58 AM

## 2016-03-11 NOTE — Clinical Social Work Note (Signed)
Clinical Social Worker continuing to follow patient and family for support and discharge planning needs.  Patient has been started on a Dysphagia 1 diet.  CSW to initiate new bed search and explore bed offers with patient family.  CSW remains available for support and to facilitate patient discharge needs once medically stable.  [name], [place]

## 2016-03-12 ENCOUNTER — Inpatient Hospital Stay (HOSPITAL_COMMUNITY): Payer: Medicare HMO

## 2016-03-12 LAB — GLUCOSE, CAPILLARY
GLUCOSE-CAPILLARY: 274 mg/dL — AB (ref 65–99)
GLUCOSE-CAPILLARY: 213 mg/dL — AB (ref 65–99)
Glucose-Capillary: 115 mg/dL — ABNORMAL HIGH (ref 65–99)
Glucose-Capillary: 144 mg/dL — ABNORMAL HIGH (ref 65–99)
Glucose-Capillary: 218 mg/dL — ABNORMAL HIGH (ref 65–99)
Glucose-Capillary: 226 mg/dL — ABNORMAL HIGH (ref 65–99)

## 2016-03-12 MED ORDER — INSULIN ASPART 100 UNIT/ML ~~LOC~~ SOLN
0.0000 [IU] | Freq: Three times a day (TID) | SUBCUTANEOUS | Status: DC
  Administered 2016-03-12 (×2): 7 [IU] via SUBCUTANEOUS
  Administered 2016-03-12 – 2016-03-13 (×3): 11 [IU] via SUBCUTANEOUS

## 2016-03-12 MED ORDER — INSULIN ASPART 100 UNIT/ML ~~LOC~~ SOLN: 0.0000 [IU] | Freq: Three times a day (TID) | SUBCUTANEOUS | Status: DC

## 2016-03-12 MED ORDER — VANCOMYCIN HCL IN DEXTROSE 1-5 GM/200ML-% IV SOLN
1000.0000 mg | Freq: Two times a day (BID) | INTRAVENOUS | Status: DC
  Administered 2016-03-13 (×2): 1000 mg via INTRAVENOUS
  Filled 2016-03-12 (×3): qty 200

## 2016-03-12 MED ORDER — INSULIN GLARGINE 100 UNIT/ML ~~LOC~~ SOLN
15.0000 [IU] | Freq: Two times a day (BID) | SUBCUTANEOUS | Status: DC
  Administered 2016-03-12 – 2016-03-13 (×2): 15 [IU] via SUBCUTANEOUS
  Filled 2016-03-12 (×3): qty 0.15

## 2016-03-12 MED ORDER — VANCOMYCIN HCL 10 G IV SOLR
2000.0000 mg | Freq: Once | INTRAVENOUS | Status: AC
  Administered 2016-03-12: 2000 mg via INTRAVENOUS
  Filled 2016-03-12: qty 2000

## 2016-03-12 NOTE — NC FL2 (Addendum)
[place] MEDICAID FL2 LEVEL OF CARE SCREENING TOOL     IDENTIFICATION  Patient Name: [name] Birthdate: Apr 27, 1941 Sex: female Admission Date (Current Location): 02/28/2016  [facility] and [place] Number:  [employer] and Address:  [facility] [place], [place] [address] [place], [place] [address]      Provider Number: [id number]  Attending Physician Name and Address:  Conrad [place]el, MD  Relative Name and Phone Number:       Current Level of Care: Hospital Recommended Level of Care: [place] Prior Approval Number:    Date Approved/Denied:   PASRR Number: [id number]  Discharge Plan: SNF    Current Diagnoses: Patient Active Problem List   Diagnosis Date Noted  . Pleural effusion, bilateral   . Pulmonary congestion   . Wheeze   . Dyspnea   . CHF (congestive heart failure) ([place])   . Right sided weakness   . Acute respiratory acidosis   . Surgery, elective   . Anemia, iron deficiency   . Leukocytosis   . Cerebrovascular accident (CVA) due to thrombosis of precerebral artery ([place]) 03/01/2016  . 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 Acute respiratory failure ([place])   . PAD (peripheral artery disease) ([place]) 02/28/2016  . Atherosclerosis of native arteries of the extremities with ulceration ([place]) 12/08/2015  . Open toe wound 05/04/2014  . Anemia, chronic disease 04/27/2014  . Occult blood positive stool 04/17/2014  . Edema 04/03/2014  . Unspecified hereditary and idiopathic peripheral neuropathy 03/14/2014  . Acute and chronic respiratory failure 02/14/2014  . Type II or unspecified type diabetes mellitus with neurological manifestations, uncontrolled 02/14/2014  . Respiratory failure ([place]) 02/12/2014  . Altered mental status 02/12/2014  . Acute encephalopathy 02/12/2014  . Acute respiratory failure with hypercapnia ([place]) 02/12/2014  . Acute diastolic CHF (congestive heart failure)  ([place]) 02/12/2014  . Aortic stenosis 02/04/2014  . Closed left subtrochanteric femur fracture ([place]) 02/03/2014  . Hip fracture, left ([place]) 02/03/2014  . Hypokalemia 02/03/2014  . Fibula fracture 02/03/2014  . MTP instability 02/03/2014  . Hip fracture ([place]) 02/03/2014  . HTN (hypertension)   . Type 2 diabetes mellitus without complication, without long-term current use of insulin ([place])   . HLD (hyperlipidemia)     Orientation RESPIRATION BLADDER Height & Weight     Self, Time, Place  O2 (4L) Incontinent Weight: 248 lb 7.3 oz (112.7 kg) Height:  5\' 5"  (165.1 cm)  BEHAVIORAL SYMPTOMS/MOOD NEUROLOGICAL BOWEL NUTRITION STATUS   (None)  (None) Incontinent Diet (Carb Modified)  AMBULATORY STATUS COMMUNICATION OF NEEDS Skin   Extensive Assist Verbally Surgical wounds, Other (Comment) (Incisions of leg right, and hip left. Diabetic foot ulcer of right foot.)                       Personal Care Assistance Level of Assistance  Bathing, Feeding, Dressing Bathing Assistance: Limited assistance Feeding assistance: Independent Dressing Assistance: Limited assistance     Functional Limitations Info  Sight, Hearing, Speech Sight Info: Adequate Hearing Info: Adequate Speech Info: Adequate    SPECIAL CARE FACTORS FREQUENCY  PT (By licensed PT), OT (By licensed OT)     PT Frequency: 5/ week OT Frequency: 5/ week            Contractures Contractures Info: Not present    Additional Factors Info  Code Status, Allergies Code Status Info: Full Allergies Info: Iodine, Penicillins, Shellfish Allergy,  Sulfa Antibiotics           Current Medications (03/12/2016):  This is the current hospital active medication list Current Facility-Administered Medications  Medication Dose Route Frequency Provider Last Rate Last Dose  .  stroke: mapping our early stages of recovery book   Does not apply Once Roland Earl, MD      . acetaminophen (TYLENOL) tablet 1,000 mg  1,000 mg Oral Q6H  PRN [name], MD   1,000 mg at 03/08/16 1128  . albuterol (PROVENTIL) (2.5 MG/3ML) 0.083% nebulizer solution 3 mL  3 mL Inhalation Q6H PRN Ulyses Amor, PA-C   3 mL at 03/09/16 2110  . amLODipine (NORVASC) tablet 10 mg  10 mg Oral Daily Rosalin Hawking, MD   10 mg at 03/12/16 1008  . aspirin tablet 325 mg  325 mg Oral Q breakfast [name], MD   325 mg at 03/12/16 1007  . atorvastatin (LIPITOR) tablet 20 mg  20 mg Per Tube Daily Javier Glazier, MD   20 mg at 03/12/16 1008  . bisacodyl (DULCOLAX) EC tablet 10 mg  10 mg Oral Daily PRN Rosalin Hawking, MD      . clopidogrel (PLAVIX) tablet 75 mg  75 mg Per Tube Q breakfast Javier Glazier, MD   75 mg at 03/12/16 1008  . collagenase (SANTYL) ointment   Topical BID Conrad Foss, MD      . ferrous sulfate 300 (60 Fe) MG/5ML syrup 300 mg  300 mg Oral TID Catha Gosselin, MD   300 mg at 03/12/16 1007  . hydrALAZINE (APRESOLINE) injection 5-20 mg  5-20 mg Intravenous Q2H PRN Rosalin Hawking, MD   20 mg at 03/11/16 0224  . insulin aspart (novoLOG) injection 0-20 Units  0-20 Units Subcutaneous Q4H Juanito Doom, MD   3 Units at 03/12/16 1008  . insulin glargine (LANTUS) injection 15 Units  15 Units Subcutaneous QHS Juanito Doom, MD   15 Units at 03/11/16 2335  . labetalol (NORMODYNE,TRANDATE) injection 10 mg  10 mg Intravenous Q2H PRN Rosalin Hawking, MD   10 mg at 03/10/16 0810  . losartan (COZAAR) tablet 100 mg  100 mg Oral Daily Ulyses Amor, PA-C   100 mg at 03/12/16 1008  . magnesium sulfate IVPB 2 g 50 mL  2 g Intravenous Daily PRN Ulyses Amor, PA-C      . metoprolol tartrate (LOPRESSOR) 25 mg/10 mL oral suspension 50 mg  50 mg Oral BID Marijean Heath, NP   50 mg at 03/12/16 1007  . ondansetron (ZOFRAN) injection 4 mg  4 mg Intravenous Q6H PRN Ulyses Amor, PA-C      . pregabalin (LYRICA) capsule 50 mg  50 mg Per Tube Q8H Javier Glazier, MD   50 mg at 03/12/16 0651  . [place]   Oral PRN [place], MD       . sennosides (SENOKOT) 8.8 MG/5ML syrup 5 mL  5 mL Per Tube BID Javier Glazier, MD   5 mL at 03/12/16 1007     Discharge Medications: Please see discharge summary for a list of discharge medications.  Relevant Imaging Results:  Relevant Lab Results:   Additional Information SSN: [id number]  Samule Dry, LCSW   Adele Barthel, MD [facility] Office: [phone] Pager: [phone]  03/12/2016, 11:41 AM

## 2016-03-12 NOTE — Progress Notes (Signed)
Inpatient Diabetes Program Recommendations  AACE/ADA: New Consensus Statement on Inpatient Glycemic Control (2015)  Target Ranges:  Prepandial:   less than 140 mg/dL      Peak postprandial:   less than 180 mg/dL (1-2 hours)      Critically ill patients:  140 - 180 mg/dL   Lab Results  Component Value Date   GLUCAP 144 (H) 03/12/2016   HGBA1C 7.7 (H) 03/02/2016    Review of Glycemic Control  Results for [name], [name] (MRN [id number]) as of 03/12/2016 08:36  Ref. Range 03/11/2016 03:44 03/11/2016 07:41 03/11/2016 11:25 03/11/2016 15:43 03/11/2016 21:32 03/12/2016 00:38 03/12/2016 04:59 03/12/2016 08:04  Glucose-Capillary Latest Ref Range: 65 - 99 mg/dL 125 (H) 116 (H) 172 (H) 272 (H) 254 (H) 226 (H) 115 (H) 144 (H)    Home DM Meds: Levemir 25 units bid, Novolog 0-15 units with meals  Current Insulin Orders: Lantus 15 units qhs, Novolog moderate correction scale Q4 hours                                                                     Since the patient has now been ordered a diet, please change Novolog Moderate Correction Scale/ SSI (0-15 units) to TID AC + HS (currently ordered as Q4 hour coverage)  Please consider adding Lantus 15 units qam (Takes bid basal insulin at home)  Gentry Fitz, RN, BA, MHA, CDE Diabetes Coordinator Inpatient Diabetes Program  [phone] (Team Pager) [phone] ([place]) 03/12/2016 8:42 AM

## 2016-03-12 NOTE — Progress Notes (Signed)
Pharmacy Antibiotic Note  [name] is a 75 y.o. female with possible post surgical wound infection (drainage from calf incision s/p  R pop to peroneal bypass  on 7/26)  to begin vancomycin per pharmacy.  -WBC= 22.6, afebrile, SCr= 0.66 and CrCl ~ 80  8/8 vanc>>   Plan: Vancomycin 1000 IV every 12 hours.  Goal trough 10-15 mcg/mL.  -Will follow renal function and clinical progress   Height: 5\' 5"  (165.1 cm) Weight: 248 lb 7.3 oz (112.7 kg) IBW/kg (Calculated) : 57  Temp (24hrs), Avg:98.6 F (37 C), Min:98.1 F (36.7 C), Max:99.2 F (37.3 C)   Recent Labs Lab 03/06/16 0500 03/07/16 0422 03/08/16 0539 03/08/16 1635 03/09/16 0614 03/10/16 0348 03/11/16 0405  WBC 13.3* 14.6* 15.2*  --   --  23.4* 22.6*  CREATININE 0.75 0.68 0.56 0.65 0.52 0.70 0.66    Estimated Creatinine Clearance: 78.4 mL/min (by C-G formula based on SCr of 0.8 mg/dL).    Allergies  Allergen Reactions  . Iodine Anaphylaxis  . Penicillins Anaphylaxis  . Shellfish Allergy Anaphylaxis  . Sulfa Antibiotics Anaphylaxis     Thank you for allowing pharmacy to be a part of this patient's care.  Hildred Laser, Pharm D 03/12/2016 1:49 PM

## 2016-03-12 NOTE — Progress Notes (Signed)
Speech Language Pathology Treatment: Dysphagia  Patient Details Name: [name] MRN: [id number] DOB: November 12, 1940 Today's Date: 03/12/2016 Time: [id number] SLP Time Calculation (min) (ACUTE ONLY): 12 min  Assessment / Plan / Recommendation Clinical Impression  Dysphagia treatment provided to check diet tolerance. Pt needed encouragement to attempt PO trials. Pt very SOB this a.m needing to take a breath every 2-3 words spoken and continues to have breathy vocal quality. Pt had immediate wet, rattling productive cough following trial of nectar-thick liquids- likely aspiration event. Pt's aspiration risk is increased at this time due to SOB. Also noted that findings on repeat CXRs not showing much improvement with persistent bibasilar chest densities. Recommend proceeding with repeat MBS to again objectively evaluate swallow function and determine safest diet/ strategies.   HPI HPI: 75 year old admitted for bypass graft popliteal to peroneal for foot ulcer. Pt developed post stroke symptoms and MRI revealed acute patchy ischemic infarcts involving left basal ganglia and internal capsule, scattered small punctate cortical and white matter infarcts involving the bilateral cerebral hemispheres and right cerebellum. Intubated 7/28-8/1. No prior ST documentation.   Per nursing patient had an aspiration event when nasal feeding tube was removed and has been wheezing since.        SLP Plan  MBS     Recommendations  Diet recommendations: Dysphagia 1 (puree);Nectar-thick liquid Liquids provided via: Cup;No straw Medication Administration: Crushed with puree Supervision: Staff to assist with self feeding;Full supervision/cueing for compensatory strategies Compensations: Slow rate;Small sips/bites;Monitor for anterior loss;Minimize environmental distractions Postural Changes and/or Swallow Maneuvers: Seated upright 90 degrees;Upright 30-60 min after meal             Oral Care Recommendations: Oral care  BID;Oral care before and after PO Follow up Recommendations: Skilled Nursing facility Plan: [place], [place], [place], CCC-SLP 03/12/2016, 11:54 AM [phone]

## 2016-03-12 NOTE — Progress Notes (Addendum)
[facility] Progress Note  Subjective   Feels like she can't talk.   Objective Vitals:   03/11/16 1830 03/11/16 2040  BP: (!) 150/36 (!) 147/77  Pulse:  77  Resp: 20 18  Temp: 98.5 F (36.9 C) 98.1 F (36.7 C)    Intake/Output Summary (Last 24 hours) at 03/12/16 1215 Last data filed at 03/12/16 [id number]  Gross per 24 hour  Intake              240 ml  Output                0 ml  Net              240 ml   Right lower leg incision edematous with serous/purulent drainage Right foot wound clean with pale tissue.  Monophasic right AT doppler signal  Assessment/Planning: 75 y.o. female is s/p:  POD #13s/p R pop to peroneal bypass with ips rGSV,  POD #11s/p L ICA stenting by NeuroIR, B CVA, Ansarca, severe protein malnutrition, severe supraclinoid L ICA stenosis  Will start vancomycin per pharmacy given possible infection of right lower leg surgical wound. Follow WBC. Continue daily dressing changes and wound care right foot.  Neuro status stable. Continue PT/OT and SLP.    [name] 03/12/2016 12:15 PM --  Laboratory CBC    Component Value Date/Time   WBC 22.6 (H) 03/11/2016 0405   HGB 9.8 (L) 03/11/2016 0405   HCT 31.7 (L) 03/11/2016 0405   PLT 261 03/11/2016 0405    BMET    Component Value Date/Time   NA 142 03/11/2016 0405   K 3.7 03/11/2016 0405   CL 100 (L) 03/11/2016 0405   CO2 36 (H) 03/11/2016 0405   GLUCOSE 128 (H) 03/11/2016 0405   BUN 22 (H) 03/11/2016 0405   CREATININE 0.66 03/11/2016 0405   CALCIUM 9.1 03/11/2016 0405   GFRNONAA >60 03/11/2016 0405   GFRAA >60 03/11/2016 0405    COAG Lab Results  Component Value Date   INR 1.20 02/26/2016   INR 1.11 02/12/2014   INR 1.08 02/04/2014   No results found for: PTT  Antibiotics Anti-infectives    Start     Dose/Rate Route Frequency Ordered Stop   03/01/16 1132  vancomycin (VANCOCIN) 1-5 GM/200ML-% IVPB    Comments:  Laughlin, Amy   : cabinet override   03/01/16 1132 03/01/16 2344   02/28/16 1930  vancomycin (VANCOCIN) IVPB 1000 mg/200 mL premix     1,000 mg 200 mL/hr over 60 Minutes Intravenous Every 12 hours 02/28/16 1624 02/29/16 0741   02/28/16 0700  vancomycin (VANCOCIN) 1,500 mg in sodium chloride 0.9 % 500 mL IVPB     1,500 mg 250 mL/hr over 120 Minutes Intravenous To ShortStay Surgical 02/27/16 0810 02/28/16 [place], PA-C [facility] Office: [phone] Pager: [phone] 03/12/2016 12:15 PM   Addendum  I have independently interviewed and examined the patient, and I agree with the physician assistant's findings.  Some seropurulent drainage from calf incision.  Will start some Vancomycin.  Signal in foot better today.  Foot wound unchange.  Obvious aphasia today but more talkative than the last few days.  Right hemiparesis remains dense.  - Start SNF placement process - Continue abx.  Can switch to PO abx when getting close to placement - Not surprised with calf drainage as the patient has some CVI in right leg, but can't really use compression  with the poor perfusion in the tibial arterial system.   Adele Barthel, MD [facility] [place] Office: [phone] Pager: [phone]  03/12/2016, 12:31 PM

## 2016-03-13 LAB — GLUCOSE, CAPILLARY
GLUCOSE-CAPILLARY: 182 mg/dL — AB (ref 65–99)
GLUCOSE-CAPILLARY: 245 mg/dL — AB (ref 65–99)
GLUCOSE-CAPILLARY: 256 mg/dL — AB (ref 65–99)
Glucose-Capillary: 203 mg/dL — ABNORMAL HIGH (ref 65–99)
Glucose-Capillary: 266 mg/dL — ABNORMAL HIGH (ref 65–99)
Glucose-Capillary: 260 mg/dL — ABNORMAL HIGH (ref 65–99)

## 2016-03-13 LAB — BASIC METABOLIC PANEL
ANION GAP: 7 (ref 5–15)
BUN: 13 mg/dL (ref 6–20)
CHLORIDE: 102 mmol/L (ref 101–111)
CO2: 33 mmol/L — ABNORMAL HIGH (ref 22–32)
Calcium: 8.5 mg/dL — ABNORMAL LOW (ref 8.9–10.3)
Creatinine, Ser: 0.5 mg/dL (ref 0.44–1.00)
Glucose, Bld: 177 mg/dL — ABNORMAL HIGH (ref 65–99)
POTASSIUM: 4.1 mmol/L (ref 3.5–5.1)
SODIUM: 142 mmol/L (ref 135–145)

## 2016-03-13 LAB — CBC
HEMATOCRIT: 30.5 % — AB (ref 36.0–46.0)
Hemoglobin: 9.4 g/dL — ABNORMAL LOW (ref 12.0–15.0)
MCH: 28.7 pg (ref 26.0–34.0)
MCHC: 30.8 g/dL (ref 30.0–36.0)
MCV: 93 fL (ref 78.0–100.0)
PLATELETS: 233 10*3/uL (ref 150–400)
RBC: 3.28 MIL/uL — ABNORMAL LOW (ref 3.87–5.11)
RDW: 15.9 % — ABNORMAL HIGH (ref 11.5–15.5)
WBC: 18 10*3/uL — ABNORMAL HIGH (ref 4.0–10.5)

## 2016-03-13 MED ORDER — ENOXAPARIN SODIUM 60 MG/0.6ML ~~LOC~~ SOLN: 55.0000 mg | Freq: Every day | SUBCUTANEOUS | Status: DC

## 2016-03-13 MED ORDER — AMLODIPINE BESYLATE 10 MG PO TABS: 10.0000 mg | ORAL_TABLET | Freq: Every day | ORAL | 3 refills | Status: DC

## 2016-03-13 MED ORDER — INSULIN GLARGINE 100 UNIT/ML ~~LOC~~ SOLN
15.0000 [IU] | Freq: Two times a day (BID) | SUBCUTANEOUS | 11 refills | Status: DC
Start: 2016-03-13 — End: 2016-09-17

## 2016-03-13 MED ORDER — CLOPIDOGREL BISULFATE 75 MG PO TABS: 75.0000 mg | ORAL_TABLET | Freq: Every day | ORAL | 3 refills | Status: DC

## 2016-03-13 MED ORDER — CIPROFLOXACIN HCL 500 MG PO TABS: 500.0000 mg | ORAL_TABLET | Freq: Two times a day (BID) | ORAL | 0 refills | Status: DC

## 2016-03-13 MED ORDER — ASPIRIN 325 MG PO TABS: 325.0000 mg | ORAL_TABLET | Freq: Every day | ORAL | 11 refills | Status: DC

## 2016-03-13 MED ORDER — ATORVASTATIN CALCIUM 20 MG PO TABS: 20.0000 mg | ORAL_TABLET | Freq: Every day | ORAL | 11 refills | Status: DC

## 2016-03-13 MED ORDER — COLLAGENASE 250 UNIT/GM EX OINT: TOPICAL_OINTMENT | Freq: Two times a day (BID) | CUTANEOUS | 0 refills | Status: DC

## 2016-03-13 MED ORDER — METOPROLOL TARTRATE 25 MG/10 ML ORAL SUSPENSION: 50.0000 mg | Freq: Two times a day (BID) | ORAL | 11 refills | Status: DC

## 2016-03-13 NOTE — Progress Notes (Signed)
Pt husband, [name] notified that pt is being transferred off unit to [place].  He said that he will meet her at [facility].

## 2016-03-13 NOTE — Clinical Social Work Placement (Signed)
   CLINICAL SOCIAL WORK PLACEMENT  NOTE  Date:  03/13/2016  Patient Details  Name: [name] MRN: [id number] Date of Birth: 04/27/1941  Clinical Social Work is seeking post-discharge placement for this patient at the [place] level of care (*CSW will initial, date and re-position this form in  chart as items are completed):  Yes   Patient/family provided with [facility] [place] Work Department's list of facilities offering this level of care within the geographic area requested by the patient (or if unable, by the patient's family).  Yes   Patient/family informed of their freedom to choose among providers that offer the needed level of care, that participate in Medicare, Medicaid or managed care program needed by the patient, have an available bed and are willing to accept the patient.  Yes   Patient/family informed of [facility]'s ownership interest in [facility] and [facility], as well as of the fact that they are under no obligation to receive care at these facilities.  PASRR submitted to EDS on       PASRR number received on       Existing PASRR number confirmed on 03/05/16     FL2 transmitted to all facilities in geographic area requested by pt/family on 03/05/16     FL2 transmitted to all facilities within larger geographic area on       Patient informed that his/her managed care company has contracts with or will negotiate with certain facilities, including the following:        Yes   Patient/family informed of bed offers received.  Patient chooses bed at [facility]     Physician recommends and patient chooses bed at      Patient to be transferred to [facility] on  .  Patient to be transferred to facility by Ambulance     Patient family notified on 03/13/16 of transfer.  Name of family member notified:  Critzer,[name]     PHYSICIAN Please prepare priority discharge summary, including medications, Please  prepare prescriptions, Please sign FL2     Additional Comment:  Per MD patient is ready to discharge to [facility] RN, patient, patient's family, and facility notified of discharge. RN given phone number for report and transport packet is on patient's chart. Ambulance transport requested. RN given patient's husband phone number [phone] to call once patient is picked up by transport. CSW signing off.  _______________________________________________ Samule Dry, LCSW 03/13/2016, 5:19 PM

## 2016-03-13 NOTE — Progress Notes (Addendum)
Removed midline due to pt being discharged to SNF.  IV team came back for IV maintenance prior to charting removal.  Was informed by IV team RN Levada Dy that midlines are only to be removed by IV team.  Dressing replaced by IV nurse with pressure dressing due to oozing.  Site no longer bleeding when dressing changed.  Of note, Levada Dy stated this was a trial catheter.  There was not a pink sheet notifying RN of trial midline in pts room.  She is going to confirm with her AD to be sure about the removal procedure, because the nurses on our floor are under the impression that it is standard procedure for our floor nurses to discontinue these lines.

## 2016-03-13 NOTE — Progress Notes (Signed)
Pharmacy note: lovenox  75 yo female s/p R pop to peroneal bypass to begin lovenox for VTE prophylaxis -Wt= 112.7kg, CrCl ~ 80, hg= 9.4 and stable, plt= 233 Body mass index is 41.35 kg/m.  Plan -Lovenox 0.5mg /kg [place] daily due to increased BMI   Hildred Laser, Pharm D 03/13/2016 12:01 PM

## 2016-03-13 NOTE — Progress Notes (Signed)
SLP Cancellation Note  Patient Details Name: [name] MRN: [id number] DOB: 1941/04/07   Cancelled treatment:       Reason Eval/Treat Not Completed: Other (comment). Pt declining treatment at this time, expressed needing to "go number 2". NT informed, will reattempt diet check later as time allows. Note MBS results from previous date- honey-thick by tsp, dysphagia 1 diet, meds crushed in puree.   Kern Reap, [place], CCC-SLP 03/13/2016, 10:09 AM [phone]

## 2016-03-13 NOTE — Progress Notes (Signed)
PT Cancellation Note  Patient Details Name: [name] MRN: [id number] DOB: 01-19-1941   Cancelled Treatment:    Reason Eval/Treat Not Completed: Medical issues which prohibited therapy;Patient not medically ready.  Pt has complaints of constipation and pain, talked with nurse to help resolve.  Will try later as time and pt allow.   Ramond Dial 03/13/2016, 12:23 PM    Mee Hives, PT MS Acute Rehab Dept. Number: [place] and [place]

## 2016-03-13 NOTE — Progress Notes (Signed)
Speech Language Pathology Treatment: Dysphagia  Patient Details Name: [name] MRN: [id number] DOB: 12/22/1940 Today's Date: 03/13/2016 Time: [id number] SLP Time Calculation (min) (ACUTE ONLY): 13 min  Assessment / Plan / Recommendation Clinical Impression  Dysphagia treatment provided for diet tolerance check which was limited today. Pt needed encouragement to try POs and only attempted two teaspoons of honey-thick liquids, after which pt expressed feeling like liquid was stuck. Cued pt to cough/ clear throat. S/s concerning for residuals; however, note that pt did not have significant residuals on MBS previous date. Pt did not want to try any additional trials despite encouragement. RN reported that pt had one coughing episode when taking meds earlier this date. Reviewed strategies with pt- pause between bites/ sips as needed, small bites/ sips, sit upright 30-60 minutes after meal. Reviewed findings/ recommendations with RN. Will continue to follow closely for diet tolerance.   HPI HPI: 75 year old admitted for bypass graft popliteal to peroneal for foot ulcer. Pt developed post stroke symptoms and MRI revealed acute patchy ischemic infarcts involving left basal ganglia and internal capsule, scattered small punctate cortical and white matter infarcts involving the bilateral cerebral hemispheres and right cerebellum. Intubated 7/28-8/1. No prior ST documentation.   Per nursing patient had an aspiration event when nasal feeding tube was removed and has been wheezing since.        SLP Plan  Continue with current plan of care     Recommendations  Diet recommendations: Dysphagia 1 (puree);Honey-thick liquid Liquids provided via: Teaspoon Medication Administration: Crushed with puree Supervision: Staff to assist with self feeding;Full supervision/cueing for compensatory strategies Compensations: Slow rate;Small sips/bites Postural Changes and/or Swallow Maneuvers: Seated upright 90  degrees;Upright 30-60 min after meal             Oral Care Recommendations: Oral care BID Follow up Recommendations: Skilled Nursing facility Plan: Continue with current plan of care     [place], [place], [place], [place] 03/13/2016, 1:11 PM [phone]

## 2016-03-13 NOTE — Progress Notes (Signed)
   Daily Progress Note  Assessment/Planning: POD #14s/p R pop to peroneal bypass with ips rGSV,  POD #12s/p L ICA stenting by NeuroIR, B CVA, Ansarca, severe protein malnutrition, severe supraclinoid L ICA stenosis   No significant change in neuro status  No significant change in vascular status  Awaiting SNF placement  Cont PT/OT/ST  Subjective  - 12 Days Post-Op  No significant events overnight, no complaints  Objective Vitals:   03/11/16 2040 03/12/16 1452 03/12/16 1952 03/13/16 0434  BP: (!) 147/77 (!) 155/45 (!) 142/45 124/63  Pulse: 77 75 80 79  Resp: 18 18 20 20   Temp: 98.1 F (36.7 C) 97.6 F (36.4 C) 98.3 F (36.8 C) 97.8 F (36.6 C)  TempSrc: Oral Oral Oral Oral  SpO2: 100% 100% 98% 99%  Weight:      Height:        Intake/Output Summary (Last 24 hours) at 03/13/16 1147 Last data filed at 03/13/16 0830  Gross per 24 hour  Intake              120 ml  Output                0 ml  Net              120 ml   NEURO obvious difficulty with word finding, alert, right side remains very weak PULM  BLL rales, improved from previous CV  RRR GI  soft, NTND VASC  L calf unchg: serosang drainage, R 1st MT wound uncg: clean, +granulation  Laboratory CBC    Component Value Date/Time   WBC 18.0 (H) 03/13/2016 0722   HGB 9.4 (L) 03/13/2016 0722   HCT 30.5 (L) 03/13/2016 0722   PLT 233 03/13/2016 0722    BMET    Component Value Date/Time   NA 142 03/13/2016 0722   K 4.1 03/13/2016 0722   CL 102 03/13/2016 0722   CO2 33 (H) 03/13/2016 0722   GLUCOSE 177 (H) 03/13/2016 0722   BUN 13 03/13/2016 0722   CREATININE 0.50 03/13/2016 0722   CALCIUM 8.5 (L) 03/13/2016 0722   GFRNONAA >60 03/13/2016 0722   GFRAA >60 03/13/2016 [id number]    Adele Barthel, MD [facility] [place] Office: [phone] Pager: [phone]  03/13/2016, 11:47 AM

## 2016-03-13 NOTE — Clinical Social Work Note (Signed)
CSW spoke with patient's husband. CSW presented bed offers. He reported preference for [facility]. CSW has started insurance authorization.   [employer], LCSW [phone]

## 2016-03-13 NOTE — Progress Notes (Signed)
Report called to Curt Bears, Therapist, sports at [facility] [facility] [employer].

## 2016-03-13 NOTE — Progress Notes (Addendum)
Nutrition Follow-up  DOCUMENTATION CODES:   Morbid obesity  INTERVENTION:    Continue Magic cup TID with meals, each supplement provides 290 kcal and 9 grams of protein  NUTRITION DIAGNOSIS:   Increased nutrient needs related to wound healing as evidenced by estimated needs, ongoing  GOAL:   Patient will meet greater than or equal to 90% of their needs, progressing  MONITOR:   PO intake, Supplement acceptance, Skin, I & O's, Labs  ASSESSMENT:   PMH of DM, diabetic neuropathy, GERD, HLD, HTN, aortic stenosis who was admitted on 7/26 for right popliteal bypass graft in the setting of chronic right lower extremity wound. 7/28 code stroke called, pt s/p L ICA revascularization.   Pt currently on a Dys 1, honey thick liquid diet. Speech Path dysphagia treatment notes reviewed. PO intake recently at 50-75% per flowsheet records. Receiving Magic Cup dessert supplement with meals. Disposition: SNF placement.  Diet Order:  DIET - DYS 1 Room service appropriate? Yes; Fluid consistency: Honey Thick  Skin:  Wound (see comment) (R DM foot ulcer, R leg and L hip incision)  Last BM:  8/9  Height:   Ht Readings from Last 1 Encounters:  03/01/16 5\' 5"  (1.651 m)    Weight >>> stable  Wt Readings from Last 1 Encounters:  03/11/16 248 lb 7.3 oz (112.7 kg)    Ideal Body Weight:  56.8 kg  BMI:  Body mass index is 41.35 kg/m.  Estimated Nutritional Needs:   Kcal:  1850-2000  Protein:  105-125 grams  Fluid:  > 1.8 L/day  EDUCATION NEEDS:   No education needs identified at this time  Arthur Holms, RD, LDN Pager #: [phone] After-Hours Pager #: [phone]

## 2016-03-13 NOTE — Progress Notes (Signed)
Inpatient Diabetes Program Recommendations  AACE/ADA: New Consensus Statement on Inpatient Glycemic Control (2015)  Target Ranges:  Prepandial:   less than 140 mg/dL      Peak postprandial:   less than 180 mg/dL (1-2 hours)      Critically ill patients:  140 - 180 mg/dL   Lab Results  Component Value Date   GLUCAP 182 (H) 03/13/2016   HGBA1C 7.7 (H) 03/02/2016    Review of Glycemic Control  Results for CHARMON, SIMONIAN (MRN [id number]) as of 03/13/2016 09:09  Ref. Range 03/12/2016 15:51 03/12/2016 20:06 03/12/2016 23:27 03/13/2016 00:16 03/13/2016 08:06  Glucose-Capillary Latest Ref Range: 65 - 99 mg/dL 213 (H) 218 (H) 245 (H) 256 (H) 182 (H)    Home DM Meds: Levemir 25 units bid, Novolog 0-15 units with meals  Current Insulin Orders: Lantus 15 units bid, Novolog moderate correction scaletid hours, Novolog 0-5 units qhs   Agree with current orders for blood sugar management.  Gentry Fitz, RN, BA, MHA, CDE Diabetes Coordinator Inpatient Diabetes Program  [phone] (Team Pager) [phone] ([place]) 03/13/2016 9:15 AM

## 2016-03-13 NOTE — Discharge Summary (Signed)
[facility] Discharge Summary  [name] Sep 01, 1940 75 y.o. female  [id number]  Admission Date: 02/28/2016  Discharge Date: 03/13/2016  Physician: Conrad Vayas, MD  Admission Diagnosis: Right foot ulcer L97.409 code stroke  HPI:   This is a 75 y.o. female who presented to the [facility] office with complaint of continued poor healing of right foot.  The patient has not rest pain and wounds include: R dorsal wound overlying 1st MT.  The patient notes symptoms have not progressed but her wound appears to be enlarging.  The patient's treatment regimen currently included: maximal medical management and wound care.  She underwent angiography on 12/28/15: bilateral extensive tibial disease.  Based on the images, her only reconstruction option was a R pop to mid-peroneal bypass with vein.  She was sent to Cardiology for further evaluation.  Nuclear stress testing appears to be low risk.    Hospital Course:   The patient was admitted to the hospital and taken to the operating room on 02/28/2016 and underwent right below-the-knee popliteal artery to peroneal artery bypass with reversed greater saphenous vein and endarterectomy of mid-segment peroneal artery  The patient tolerated the procedure well and was transported to the PACU in stable condition.   POD 1: The patient was lethargic but answering questions and following commands. Her incisions were intact. The doppler signal to her left foot was weaker. Given the diseased target, her bypass was a high risk for failure. She received 2 units of pRBCs for acute surgical blood loss anemia.   POD 2: The patient's doppler flow continued to be weak. She was found to have a left sided gaze preference, aphasia and right sided hemiplegia. A code stroke was called. Reportedly overnight, the patient had some confusion. The patient was taken to CT that showed no acute stroke. There was a delay in obtaining a CTA due to difficulty with  peripheral IV and need for premedication due to contrast allergy. Her respiratory status began to decline and required intubation by anesthesia. 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 She then underwent stent assisted angioplasty and 4.5 mg of IA Integrelin TICI flow MCA distribution. She was then taken to the neuro ICU. Critical care was consulted for ICU assistance. She was transfused 1 unit of pRBCS for anemia.   POD 3 (03/02/16): Continued on vent. TTE showing moderate concentric LVH with EF 55-60%, could not exclude wall motion abnormality.   03/03/16: A CT abdomen and pelvis was ordered to rule out retroperitoneal bleed given continued anemia despite transfusion This was negative to intra or retroperitoneal bleed. She was transfused an additional unit of pRBCs. Her right foot was pale suggesting likely occluded bypass graft.   03/05/16: Was extubated without difficulty. The patient had an elevated troponin 0.1 and EKG T-wave inversion lateral limb and precordial leads which were not new. There was no change in management. She was reportedly moving a bit of her right arm and leg. Her right foot continued to appear improved.   03/06/16:  Had hypernatremia. Free water deficit calculated at 4.7 L and replaced with 250 mL free water q 4 h x 24h. . Her leukocytosis continued to improve. Her hemoglobin also continued to improve.   03/08/16: Her hypernatremia improved. She was more alert. Her NG tube was removed and passed swallow test.   03/10/16: She had some serous drainage from  right leg incision from anasarca and severe protein malnutrition. Still had significant neurologic deficits. Wound care was continued to her right foot.   03/11/16: Hypernatremia resolved. Neurologic status remained stable.   The patient was recommended SNF by OT and PT for  continued rehab. Dressing changes were continued to her right foot and leg. Her vascular status and neurological status remained stable. She was discharged to SNF on 03/13/16 in stable condition.   CBC    Component Value Date/Time   WBC 18.0 (H) 03/13/2016 0722   RBC 3.28 (L) 03/13/2016 0722   HGB 9.4 (L) 03/13/2016 0722   HCT 30.5 (L) 03/13/2016 0722   PLT 233 03/13/2016 0722   MCV 93.0 03/13/2016 0722   MCH 28.7 03/13/2016 0722   MCHC 30.8 03/13/2016 0722   RDW 15.9 (H) 03/13/2016 0722   LYMPHSABS 1.0 03/05/2016 0600   MONOABS 1.4 (H) 03/05/2016 0600   EOSABS 0.1 03/05/2016 0600   BASOSABS 0.0 03/05/2016 0600    BMET    Component Value Date/Time   NA 142 03/13/2016 0722   K 4.1 03/13/2016 0722   CL 102 03/13/2016 0722   CO2 33 (H) 03/13/2016 0722   GLUCOSE 177 (H) 03/13/2016 0722   BUN 13 03/13/2016 0722   CREATININE 0.50 03/13/2016 0722   CALCIUM 8.5 (L) 03/13/2016 0722   GFRNONAA >60 03/13/2016 0722   GFRAA >60 03/13/2016 [id number]     Discharge Instructions:   The patient is discharged to SNF with extensive instructions on wound care and progressive ambulation.  They are instructed not to drive or perform any heavy lifting until returning to see the physician in his office.  Discharge Instructions    Call MD for:  redness, tenderness, or signs of infection (pain, swelling, bleeding, redness, odor or green/yellow discharge around incision site)    Complete by:  As directed   Call MD for:  severe or increased pain, loss or decreased feeling  in affected limb(s)    Complete by:  As directed   Call MD for:  temperature >100.5    Complete by:  As directed   Discharge wound care:    Complete by:  As directed   Apply Santyl to dorsum right foot wound bid with moist NS gauze and dry gauze on top twice daily.  Dry gauze to right leg incisions and wrap with kerlix twice daily.   Increase activity slowly    Complete by:  As directed   Walk with assistance use walker or cane as needed       Discharge Diagnosis:  Right foot ulcer L97.409 code stroke  Secondary Diagnosis: Patient Active Problem List   Diagnosis Date Noted  . Pleural effusion, bilateral   . Pulmonary congestion   . Wheeze   . Dyspnea   . CHF (congestive heart failure) ([place])   . Right sided weakness   . Acute respiratory acidosis   . Surgery, elective   . Anemia, iron deficiency   . Leukocytosis   . Cerebrovascular accident (CVA) due to thrombosis of precerebral artery ([place]) 03/01/2016  . 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 Acute respiratory failure ([place])   . PAD (peripheral artery disease) ([place]) 02/28/2016  . Atherosclerosis of native arteries of the extremities with ulceration ([place]) 12/08/2015  . Open toe wound 05/04/2014  . Anemia, chronic disease 04/27/2014  . Occult blood positive stool 04/17/2014  . Edema 04/03/2014  . Unspecified hereditary and idiopathic peripheral neuropathy 03/14/2014  .  Acute and chronic respiratory failure 02/14/2014  . Type II or unspecified type diabetes mellitus with neurological manifestations, uncontrolled 02/14/2014  . Respiratory failure ([place]) 02/12/2014  . Altered mental status 02/12/2014  . Acute encephalopathy 02/12/2014  . Acute respiratory failure with hypercapnia ([place]) 02/12/2014  . Acute diastolic CHF (congestive heart failure) ([place]) 02/12/2014  . Aortic stenosis 02/04/2014  . Closed left subtrochanteric femur fracture ([place]) 02/03/2014  . Hip fracture, left ([place]) 02/03/2014  . Hypokalemia 02/03/2014  . Fibula fracture 02/03/2014  . MTP instability 02/03/2014  . Hip fracture ([place]) 02/03/2014  . HTN (hypertension)   . Type 2 diabetes mellitus without complication, without long-term current use of insulin ([place])   . HLD (hyperlipidemia)    Past Medical History:  Diagnosis Date  . Aortic stenosis   . Complication of anesthesia    Hard to wake patient up after having anesthesia  . Diabetes mellitus  without complication ([place])   . Diabetic neuropathy ([facility])    Takes Lyrica  . Edema of both legs    Takes Lasix  . GERD (gastroesophageal reflux disease)   . High cholesterol   . HTN (hypertension)   . Hypertension   . Shortness of breath dyspnea   . Spasm of back muscles   . Wound, open    Right great toe       Medication List    STOP taking these medications   aspirin EC 81 MG tablet Replaced by:  aspirin 325 MG tablet   carvedilol 25 MG tablet Commonly known as:  COREG   insulin detemir 100 UNIT/ML injection Commonly known as:  LEVEMIR     TAKE these medications   acetaminophen 500 MG tablet Commonly known as:  TYLENOL Take 500 mg by mouth every 6 (six) hours as needed for mild pain.   albuterol 108 (90 Base) MCG/ACT inhaler Commonly known as:  PROVENTIL HFA;VENTOLIN HFA Inhale 2 puffs into the lungs every 6 (six) hours as needed.   amLODipine 10 MG tablet Commonly known as:  NORVASC Take 1 tablet (10 mg total) by mouth daily.   aspirin 325 MG tablet Take 1 tablet (325 mg total) by mouth daily with breakfast. Replaces:  aspirin EC 81 MG tablet   atorvastatin 20 MG tablet Commonly known as:  LIPITOR Take 1 tablet (20 mg total) by mouth daily.   B-D INS SYRINGE 0.5CC/31GX5/16 31G X 5/16" 0.5 ML Misc Generic drug:  Insulin Syringe-Needle U-100   cetirizine 10 MG tablet Commonly known as:  ZYRTEC Take 10 mg by mouth daily as needed for allergies.   ciprofloxacin 500 MG tablet Commonly known as:  CIPRO Take 1 tablet (500 mg total) by mouth 2 (two) times daily.   clopidogrel 75 MG tablet Commonly known as:  PLAVIX Take 1 tablet (75 mg total) by mouth daily with breakfast.   collagenase ointment Commonly known as:  SANTYL Apply topically 2 (two) times daily. Apply to dorsum right foot wound daily.   docusate sodium 100 MG capsule Commonly known as:  COLACE Take 100 mg by mouth 2 (two) times daily.   fluticasone 50 MCG/ACT nasal spray Commonly known  as:  FLONASE Place 1 spray into both nostrils daily.   furosemide 40 MG tablet Commonly known as:  LASIX Take 80 mg by mouth 2 (two) times daily.   insulin aspart 100 UNIT/ML injection Commonly known as:  novoLOG Inject 0-15 Units into the skin 3 (three) times daily with meals. Before each meal 3 times a day,  140-199 - 3 units, 200-250 - 5 units, 251-299 - 8 units,  300-349 - 12 units,  350 or above 15 units. What changed:  how much to take  when to take this  additional instructions   insulin glargine 100 UNIT/ML injection Commonly known as:  LANTUS Inject 0.15 mLs (15 Units total) into the skin 2 (two) times daily.   losartan 100 MG tablet Commonly known as:  COZAAR Take 1 tablet (100 mg total) by mouth daily.   metoprolol tartrate 25 mg/10 mL Susp Commonly known as:  LOPRESSOR Take 20 mLs (50 mg total) by mouth 2 (two) times daily.   omeprazole 20 MG capsule Commonly known as:  PRILOSEC Take 20 mg by mouth daily.   polyethylene glycol packet Commonly known as:  MIRALAX / GLYCOLAX Take 17 g by mouth 2 (two) times daily.   potassium chloride SA 20 MEQ tablet Commonly known as:  K-DUR,KLOR-CON Take 20 mEq by mouth daily.   pregabalin 50 MG capsule Commonly known as:  LYRICA Take 50 mg by mouth 3 (three) times daily.        Disposition: SNF  Patient's condition: is Fair  Follow up: 1. Dr. Bridgett Larsson in 2 weeks    Virgina Jock, PA-C [facility] [phone] 03/13/2016  4:37 PM  - For VQI Registry use --- Instructions: Press F2 to tab through selections.  Delete question if not applicable.   Addendum  I have independently interviewed and examined the patient, and I agree with the physician assistant's discharge summary.  This patient underwent a right popliteal to peroneal bypass with ipsilateral greater saphenous vein.  Unfortunately, the peroneal artery turned out to severely diseased.  With an extensive endarterectomy, I was able to  open a distal target for the bypass but the distal artery was so diseased that a 2 mm dilator would not past.  The completion angiogram did not demonstrate perfusion down to the foot.  There was no doppler signal present immediately post-op.  In recovery, with rewarming, she regained a strong peroneal signal, suggesting some vasospasm in the peroneal artery.  The patient was recovering appropriately on POD #1.  Unfortunately, on POD #2, she was no waking appropriately.  When she did wake, her exam was concerning for a stroke.  A Code stroke was activated and the patient was intubated due to compromised mental status.  The stroke work-up demonstrated bilateral CVA with left ICA occlusion in progress.  NeuroIR was consult and salvage therapy with transcatheter lysis and stenting of the L ICA.  Post-interventional imaging demonstrated intact perfusion to the left brain.  The patient was left intubated for several days while her fluid status was adjusted with diuresis.  She came into the hospital a bit fluid overloaded.  Eventually her lung cleared up as did her fluid status.  She was extubated.  The patient has comprehensive testing with speech therapy, physical therapy, and occupational therapy.  PMR also evaluated this patient.  She slowly recovered some neurologic function.  Long-term rehab was felt to be necessary by all rehab service.  Her right calf incision developed some skin separation and signs of possible infection.  She was started on antibiotics to control her post-surgical infection.  Her first dorsal metatarsal wound was managed with Santyl and wet-to-dry dressing.  Pt is being discharged to SNF for continued wound care and rehab.  Will have her follow up next Friday for wound checks.  Adele Barthel, MD [facility] Office:  [phone] Pager: [phone]  03/14/2016, 3:06 PM    Post-op:  Wound infection: No  Graft infection: No  Transfusion: Yes  If yes, 4 units  given New Arrhythmia: No Ipsilateral amputation: No, [ ]  Minor, [ ]  BKA, [ ]  AKA Discharge patency: [ x] Primary, [ ]  Primary assisted, [ ]  Secondary, [ ]  Occluded Patency judged by: [x ] Dopper only, [ ]  Palpable graft pulse, [ ]  Palpable distal pulse, [ ]  ABI inc. > 0.15, [ ]  Duplex D/C Ambulatory Status: Bedridden  Complications: MI: No, [ ]  Troponin only, [ ]  EKG or Clinical CHF: Yes Resp failure:Yes, [ ]  Pneumonia, [x ] Ventilator Chg in renal function: No, [ ]  Inc. Cr > 0.5, [ ]  Temp. Dialysis, [ ]  Permanent dialysis Stroke: Yes, [ ]  Minor, [ x] Major Return to OR: No  Reason for return to OR: [ ]  Bleeding, [ ]  Infection, [ ]  Thrombosis, [ ]  Revision  Discharge medications: Statin use:  yes ASA use:  yes Plavix use:  yes Beta blocker use: yes Coumadin use: no

## 2016-03-13 NOTE — Care Management Important Message (Signed)
Important Message  Patient Details  Name: [name] MRN: [id number] Date of Birth: [birth year]   Medicare Important Message Given:  Yes    Loann Quill 03/13/2016, 2:51 PM

## 2016-03-14 ENCOUNTER — Telehealth (HOSPITAL_COMMUNITY): Payer: Self-pay

## 2016-03-14 NOTE — Telephone Encounter (Signed)
Called to schedule f/u, left message for pt to return call. AW 

## 2016-04-09 ENCOUNTER — Telehealth: Payer: Self-pay | Admitting: Vascular Surgery

## 2016-04-09 NOTE — Telephone Encounter (Signed)
Left message for [name] at [facility] care for pt's appt on 04/12/16  per Kay's staff message from 04/05/16.

## 2016-04-09 NOTE — Progress Notes (Signed)
    Postoperative Visit   History of Present Illness  [name] is a 75 y.o. year old female who presents for postoperative follow-up for:   1. Right below-the-knee popliteal artery to peroneal artery bypass with reversed greater saphenous vein  2. Endarterectomy of mid-segment peroneal artery (Date: 02/28/16).    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  The patient has been in rehab since discharge from the hospital.  The patient's wounds are not healed.  The patient has worsening drainage from R calf.  Per the patient's husband, the dressing are only being done q4 days.  The patient's neurologic deficits reportedly are better but she still has weakness in R side with inability to walk and continued problems with word findings.  For VQI Use Only  PRE-ADM LIVING: Nursing home  AMB STATUS: Wheelchair  Physical Examination  Vitals:   04/12/16 1113 04/12/16 1114  BP: (!) 98/56 (!) 124/58  Pulse: (!) 104 94  Resp:    Temp:       RLE: R 1st MT ulcer: contract, clean, some macerated skin around ulcer; medial calf incision partial open, necrotic debris evident after opening incision further, good viable tissue deep with intact deep closure  Neuro: RUE 2-3/5, RLE 2/5, continued expressive aphasia   Medical Decision Making  [name] is a 75 y.o. year old female who presents s/p R BK pop to peroneal artery bypass with rGSV, EA mid-segment peroneal artery, B CVA with residual CVA   R foot can be switched to wet-to-dry dressing with hydrogel BID or Aquacel Ag daily.  R calf will need wet-to-dry dressings with hydrogel BID.  Pt needs continued Neuro rehab.  All of these needs would necessitate continued care in SNF as I doubt the husband is capable of provide all of these services even with home health.  Follow up for wound check in 2 weeks  Thank you for allowing [place] to  participate in this patient's care.  Adele Barthel, MD, FACS [facility] [place] Office: [phone] Pager: [phone]

## 2016-04-11 ENCOUNTER — Encounter: Payer: Self-pay | Admitting: Vascular Surgery

## 2016-04-12 ENCOUNTER — Ambulatory Visit (INDEPENDENT_AMBULATORY_CARE_PROVIDER_SITE_OTHER): Payer: Medicare HMO | Admitting: Vascular Surgery

## 2016-04-12 ENCOUNTER — Encounter: Payer: Self-pay | Admitting: Vascular Surgery

## 2016-04-12 VITALS — BP 124/58 | HR 94 | Temp 98.0°F | Resp 20 | Ht 65.0 in | Wt 248.0 lb

## 2016-04-12 DIAGNOSIS — I6300 Cerebral infarction due to thrombosis of unspecified precerebral artery: Secondary | ICD-10-CM

## 2016-04-12 DIAGNOSIS — I7025 Atherosclerosis of native arteries of other extremities with ulceration: Secondary | ICD-10-CM

## 2016-04-17 ENCOUNTER — Telehealth: Payer: Self-pay

## 2016-04-17 NOTE — Telephone Encounter (Signed)
Rec'd phone call from nurse at [facility] with update on change in wound (R) LE.  Reported the wound of right calf region is "larger/ deeper/ and has increased yellow, thick drainage."  Stated the dressings are being changed BID, and "there is a lot of drainage with each dressing change."  Stated that the peri-wound is intact, and wound bed is pink.  Denied any odor to the drainage. Is requesting to re-evaluate for new wound care orders, since the wound is staying wet.  Appt. Offered for 9/14 or 9/15.  Nurse accepted appt. 9/15 @ 3:15 PM with NP.  Stated she will make the pt's husband aware.

## 2016-04-18 ENCOUNTER — Encounter: Payer: Self-pay | Admitting: Family

## 2016-04-19 ENCOUNTER — Ambulatory Visit (INDEPENDENT_AMBULATORY_CARE_PROVIDER_SITE_OTHER): Payer: Medicare HMO | Admitting: Family

## 2016-04-19 ENCOUNTER — Encounter: Payer: Self-pay | Admitting: Family

## 2016-04-19 VITALS — BP 128/59 | HR 89 | Temp 98.9°F | Resp 16

## 2016-04-19 DIAGNOSIS — T8131XD Disruption of external operation (surgical) wound, not elsewhere classified, subsequent encounter: Secondary | ICD-10-CM

## 2016-04-19 DIAGNOSIS — Z95828 Presence of other vascular implants and grafts: Secondary | ICD-10-CM

## 2016-04-19 DIAGNOSIS — I7025 Atherosclerosis of native arteries of other extremities with ulceration: Secondary | ICD-10-CM

## 2016-04-19 DIAGNOSIS — I6300 Cerebral infarction due to thrombosis of unspecified precerebral artery: Secondary | ICD-10-CM

## 2016-04-19 NOTE — Patient Instructions (Signed)
Peripheral Vascular Disease Peripheral vascular disease (PVD) is a disease of the blood vessels that are not part of your heart and brain. A simple term for PVD is poor circulation. In most cases, PVD narrows the blood vessels that carry blood from your heart to the rest of your body. This can result in a decreased supply of blood to your arms, legs, and internal organs, like your stomach or kidneys. However, it most often affects a person's lower legs and feet. There are two types of PVD.  Organic PVD. This is the more common type. It is caused by damage to the structure of blood vessels.  Functional PVD. This is caused by conditions that make blood vessels contract and tighten (spasm). Without treatment, PVD tends to get worse over time. PVD can also lead to acute ischemic limb. This is when an arm or limb suddenly has trouble getting enough blood. This is a medical emergency. CAUSES Each type of PVD has many different causes. The most common cause of PVD is buildup of a fatty material (plaque) inside of your arteries (atherosclerosis). Small amounts of plaque can break off from the walls of the blood vessels and become lodged in a smaller artery. This blocks blood flow and can cause acute ischemic limb. Other common causes of PVD include:  Blood clots that form inside of blood vessels.  Injuries to blood vessels.  Diseases that cause inflammation of blood vessels or cause blood vessel spasms.  Health behaviors and health history that increase your risk of developing PVD. RISK FACTORS  You may have a greater risk of PVD if you:  Have a family history of PVD.  Have certain medical conditions, including:  High cholesterol.  Diabetes.  High blood pressure (hypertension).  Coronary heart disease.  Past problems with blood clots.  Past injury, such as burns or a broken bone. These may have damaged blood vessels in your limbs.  Buerger disease. This is caused by inflamed blood  vessels in your hands and feet.  Some forms of arthritis.  Rare birth defects that affect the arteries in your legs.  Use tobacco.  Do not get enough exercise.  Are obese.  Are age 50 or older. SIGNS AND SYMPTOMS  PVD may cause many different symptoms. Your symptoms depend on what part of your body is not getting enough blood. Some common signs and symptoms include:  Cramps in your lower legs. This may be a symptom of poor leg circulation (claudication).  Pain and weakness in your legs while you are physically active that goes away when you rest (intermittent claudication).  Leg pain when at rest.  Leg numbness, tingling, or weakness.  Coldness in a leg or foot, especially when compared with the other leg.  Skin or hair changes. These can include:  Hair loss.  Shiny skin.  Pale or bluish skin.  Thick toenails.  Inability to get or maintain an erection (erectile dysfunction). People with PVD are more prone to developing ulcers and sores on their toes, feet, or legs. These may take longer than normal to heal. DIAGNOSIS Your health care provider may diagnose PVD from your signs and symptoms. The health care provider will also do a physical exam. You may have tests to find out what is causing your PVD and determine its severity. Tests may include:  Blood pressure recordings from your arms and legs and measurements of the strength of your pulses (pulse volume recordings).  Imaging studies using sound waves to take pictures of   the blood flow through your blood vessels (Doppler ultrasound).  Injecting a dye into your blood vessels before having imaging studies using:  X-rays (angiogram or arteriogram).  Computer-generated X-rays (CT angiogram).  A powerful electromagnetic field and a computer (magnetic resonance angiogram or MRA). TREATMENT Treatment for PVD depends on the cause of your condition and the severity of your symptoms. It also depends on your age. Underlying  causes need to be treated and controlled. These include long-lasting (chronic) conditions, such as diabetes, high cholesterol, and high blood pressure. You may need to first try making lifestyle changes and taking medicines. Surgery may be needed if these do not work. Lifestyle changes may include:  Quitting smoking.  Exercising regularly.  Following a low-fat, low-cholesterol diet. Medicines may include:  Blood thinners to prevent blood clots.  Medicines to improve blood flow.  Medicines to improve your blood cholesterol levels. Surgical procedures may include:  A procedure that uses an inflated balloon to open a blocked artery and improve blood flow (angioplasty).  A procedure to put in a tube (stent) to keep a blocked artery open (stent implant).  Surgery to reroute blood flow around a blocked artery (peripheral bypass surgery).  Surgery to remove dead tissue from an infected wound on the affected limb.  Amputation. This is surgical removal of the affected limb. This may be necessary in cases of acute ischemic limb that are not improved through medical or surgical treatments. HOME CARE INSTRUCTIONS  Take medicines only as directed by your health care provider.  Do not use any tobacco products, including cigarettes, chewing tobacco, or electronic cigarettes. If you need help quitting, ask your health care provider.  Lose weight if you are overweight, and maintain a healthy weight as directed by your health care provider.  Eat a diet that is low in fat and cholesterol. If you need help, ask your health care provider.  Exercise regularly. Ask your health care provider to suggest some good activities for you.  Use compression stockings or other mechanical devices as directed by your health care provider.  Take good care of your feet.  Wear comfortable shoes that fit well.  Check your feet often for any cuts or sores. SEEK MEDICAL CARE IF:  You have cramps in your legs  while walking.  You have leg pain when you are at rest.  You have coldness in a leg or foot.  Your skin changes.  You have erectile dysfunction.  You have cuts or sores on your feet that are not healing. SEEK IMMEDIATE MEDICAL CARE IF:  Your arm or leg turns cold and blue.  Your arms or legs become red, warm, swollen, painful, or numb.  You have chest pain or trouble breathing.  You suddenly have weakness in your face, arm, or leg.  You become very confused or lose the ability to speak.  You suddenly have a very bad headache or lose your vision.   This information is not intended to replace advice given to you by your health care provider. Make sure you discuss any questions you have with your health care provider.   Document Released: 08/29/2004 Document Revised: 08/12/2014 Document Reviewed: 12/30/2013 Elsevier Interactive Patient Education 2016 [employer]  

## 2016-04-19 NOTE — Progress Notes (Signed)
Postoperative Visit   History of Present Illness  [name] is a 75 y.o. year old female female patient of Dr. Bridgett Larsson who is s/p  1. Right below-the-knee popliteal artery to peroneal artery bypass with reversed greater saphenous vein  2. Endarterectomy of mid-segment peroneal artery (Date: 02/28/16).    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  The patient has been in rehab since discharge from the hospital.  Dr. Bridgett Larsson last saw pt on 04/12/16. At that time the patient's wounds were not healed.  The patient had worsening drainage from R calf.  Per the patient's husband, the dressing were only being done q4 days.  The patient's neurologic deficits reportedly were better but she still has weakness in R side with inability to walk and continued problems with word findings. Dr. Bridgett Larsson advised the following at that visit:                 R foot can be switched to wet-to-dry dressing with hydrogel BID or Aquacel Ag daily.  R calf will need wet-to-dry dressings with hydrogel BID.  Pt needs continued Neuro rehab.  All of these needs would necessitate continued care in SNF as I doubt the husband is capable of provide all of these services even with home health.  Follow up for wound check in 2 weeks  Pt returns today after triage nurse rec'd phone call on 04/17/16 from nurse at [facility] with update on change in wound (R) LE.  Reported the wound of right calf region is "larger/ deeper/ and has increased yellow, thick drainage."  Stated the dressings are being changed BID, and "there is a lot of drainage with each dressing change."  Stated that the peri-wound is intact, and wound bed is pink.  Denied any odor to the drainage. Is requesting to re-evaluate for new wound care orders, since the wound is staying wet.  A1C in July, 2017 was 7.7 (review of records), has insulin dependent DM.  The  patient is not able to complete their activities of daily living.  She is being cared for in a SNF currently.   For VQI Use Only  PRE-ADM LIVING: Nursing home  AMB STATUS: Wheelchair  Physical Examination  Vitals:   04/19/16 1512  BP: (!) 128/59  Pulse: 89  Resp: 16  Temp: 98.9 F (37.2 C)  SpO2: 100%   There is no height or weight on file to calculate BMI.   Both feet have 1+ pitting edema and 2+ non pitting edema. Right DP artery with brisk Doppler signal, unable to obtain Doppler signals from right PT and peroneal arteries.  Right foot ulcer is contracting with pink viable tissue in shallow wound bed. Right calf wound at medial aspect of calf is deep with red viable appearing tissue, no purulence, moderate mount of thin serosanguinous drainage.     Medical Decision Making  [name] is a 75 y.o. year old female who presents s/p right below-the-knee popliteal artery to peroneal artery bypass with reversed greater saphenous vein and endarterectomy of mid-segment peroneal artery (Date: 02/28/16).   Dr. Donzetta Matters spoke with pt and family and examined pt. No signs of infection in either the right calf or foot wounds. Will instruct SNF to start wound vac on right calf open wound. Continue wet-to-dry dressing with hydrogel BID or Aquacel Ag daily to right foot wound at  dorsal aspect. The right foot wound previously had a wound vac and this has improved the granulation and contraction of the foot wound.  Elevate feet above the heart for 20 minutes, 3-4x/day, and overnight, to reduce the dependent edema that was encouraging the dehiscence of right calf wound. Continue Neuro rehab. Continue SNF to address the above needs.  Follow up with Dr. Bridgett Larsson next week as scheduled.    Thank you for allowing [place] to participate in this patient's care.  Nelta Caudill, Sharmon Leyden, RN, MSN, FNP-C [facility] Office: [phone]  04/19/2016, 3:16 PM  Clinic MD:  Donzetta Matters

## 2016-04-22 ENCOUNTER — Encounter: Payer: Self-pay | Admitting: Vascular Surgery

## 2016-04-25 NOTE — Progress Notes (Deleted)
    Postoperative Visit   History of Present Illness  [name] is a 75 y.o. (1941/06/28) female   who presents for postoperative follow-up for:   1. Right below-the-knee popliteal artery to peroneal artery bypass with reversed greater saphenous vein  2. Endarterectomy of mid-segment peroneal artery (Date: 02/28/16).    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  The patient has been in rehab since discharge from the hospital.  The patient's wounds are not healed.  The patient has ***worsening drainage from R calf.  Per the patient's husband, the dressing are only being done q4 days.  The patient's neurologic deficits reportedly are better but she still has weakness in R side with inability to walk and continued problems with word findings.   For VQI Use Only  PRE-ADM LIVING: Nursing home  AMB STATUS: Wheelchair   Physical Examination  There were no vitals filed for this visit.   ***RLE: R 1st MT ulcer: contract, clean, some macerated skin around ulcer; medial calf incision partial open, necrotic debris evident after opening incision further, good viable tissue deep with intact deep closure  ***Neuro: RUE 2-3/5, RLE 2/5, continued expressive aphasia   Medical Decision Making  [name] is a 75 y.o. ([birth year]) female  who presents s/p R BK pop to peroneal artery bypass with rGSV, EA mid-segment peroneal artery, B CVA with residual CVA   ***  Pt needs continued Neuro rehab.  All of these needs would necessitate continued care in SNF as I doubt the husband is capable of provide all of these services even with home health.  Follow up for wound check in 2 weeks  Thank you for allowing [place] to participate in this patient's care.  Adele Barthel, MD, FACS [facility] [place] Office: [phone] Pager: [phone]

## 2016-04-26 ENCOUNTER — Ambulatory Visit: Payer: Medicare HMO | Admitting: Vascular Surgery

## 2016-04-29 ENCOUNTER — Telehealth: Payer: Self-pay | Admitting: Vascular Surgery

## 2016-04-29 NOTE — Telephone Encounter (Signed)
-----   Message from Mena Goes, RN sent at 04/25/2016  1:40 PM EDT ----- Regarding: RE: pt question Yes next week is fine   ----- Message ----- From: Georgiann Mccoy Sent: 04/25/2016  12:43 PM To: Mena Goes, RN Subject: pt question                                    Hi Zigmund Daniel,  This pt's husband lm wondering if he should resch the pt's appt out another week from 04/26/16 since the wound vac was just put on.  Their contact # is [phone]  Thank you, Selinda Eon

## 2016-04-29 NOTE — Telephone Encounter (Signed)
Sched appt 05/02/16 at 10:45. Lm to inform pt of appt.

## 2016-05-01 ENCOUNTER — Encounter: Payer: Self-pay | Admitting: Vascular Surgery

## 2016-05-02 ENCOUNTER — Ambulatory Visit (INDEPENDENT_AMBULATORY_CARE_PROVIDER_SITE_OTHER): Payer: Medicare HMO | Admitting: Vascular Surgery

## 2016-05-02 ENCOUNTER — Encounter: Payer: Self-pay | Admitting: Vascular Surgery

## 2016-05-02 VITALS — BP 139/55 | HR 84 | Temp 98.2°F | Resp 18 | Ht 65.0 in | Wt 248.0 lb

## 2016-05-02 DIAGNOSIS — I63032 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: Secondary | ICD-10-CM

## 2016-05-02 DIAGNOSIS — I7025 Atherosclerosis of native arteries of other extremities with ulceration: Secondary | ICD-10-CM

## 2016-05-02 NOTE — Progress Notes (Signed)
   Postoperative Visit   History of Present Illness  [name] is a 75 y.o. (09-28-1940) female who presents for postoperative follow-up for:   1. Right below-the-knee popliteal artery to peroneal artery bypass with reversed greater saphenous vein  2. Endarterectomy of mid-segment peroneal artery (Date: 02/28/16).    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  The patient has been in rehab since discharge from the hospital.  The patient's wounds are not healed.  The patient was converted to a VAC dressing for the calf.  The R 1st MT wound is nearly healed.  The patient's neurologic deficits reportedly are better but she still has weakness in R side with inability to walk and continued problems with word findings.  Additionally, pt now is having B arm pain (R>L) with para/anesthesia in both hands.  This is associated with increasing neck pain  For VQI Use Only  PRE-ADM LIVING: Nursing home  AMB STATUS: Wheelchair  Physical Examination  Vitals:   05/02/16 1056  BP: (!) 139/55  Pulse: 84  Resp: 18  Temp: 98.2 F (36.8 C)  TempSrc: Oral  SpO2: 100%  Weight: 248 lb (112.5 kg)  Height: 5\' 5"  (1.651 m)    RLE: R 1st MT ulcer: nearly healed, one punctate lesion with serous drainage, medial calf incision >75% contracted with pink granulation base, toes demonstrate onychomyocosis  Neuro: RUE 3-4/5 mostly with hand grip 2-3/5, RLE not fully tested 2/5, continued expressive aphasia with some perseverance, hearing significantly worse   Medical Decision Making  [name] is a 75 y.o. 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  Dry  dressing or Aquacel Ag to R 1st MT wound daily.  Wound check in 4 weeks  Continue SNF until wounds fully healed.  Pt's arm and neck sx are worrisome for possible cervical HNP.  Will defer further evaluation to her PCP.  Would defer treatment of R foot onychomycosis until R leg wounds are healed.  Thank you for allowing [place] to participate in this patient's care.  Adele Barthel, MD, FACS [facility] Office: [phone] Pager: [phone]

## 2016-05-09 ENCOUNTER — Other Ambulatory Visit (HOSPITAL_COMMUNITY): Payer: Self-pay | Admitting: Interventional Radiology

## 2016-05-09 DIAGNOSIS — I639 Cerebral infarction, unspecified: Secondary | ICD-10-CM

## 2016-05-21 NOTE — Progress Notes (Signed)
Late entry for missed modified Rankin Score. Based on documentation by Orlinda Blalock, PT and Alben Deeds, PT.     03/11/16 1532  Modified Rankin (Stroke Patients Only)  Pre-Morbid Rankin Score 4  Modified Rankin [place], [place]  [phone] 05/21/2016

## 2016-05-27 ENCOUNTER — Encounter: Payer: Self-pay | Admitting: Vascular Surgery

## 2016-05-27 NOTE — Progress Notes (Signed)
   Postoperative Visit   History of Present Illness  [name] is a 75 y.o. (1941/06/06) female who presents for postoperative follow-up for:   1. Right below-the-knee popliteal artery to peroneal artery bypass with reversed greater saphenous vein  2. Endarterectomy of mid-segment peroneal artery(Date: 02/28/16).   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 The patient has been in rehab since discharge from the hospital.  The patient's wounds are nothealed. The patient was converted to a VAC dressing for the calf.  The R 1st MT wound had healed but opened back up.  The patient's neurologic deficits reportedly are better but she still has weakness in R side with inability to walk and continued problems with word findings.  Additionally, pt now is having B arm pain (R>L) with para/anesthesia in both hands.  This is associated with increasing neck pain.  The patient's hearing has also worsened.  For VQI Use Only  PRE-ADM LIVING: Nursing home  AMB STATUS: Wheelchair  Physical Examination  Vitals:   05/29/16 1011  BP: (!) 103/54  Pulse: 77  Resp: 14  Temp: 98.9 F (37.2 C)  TempSrc: Oral  SpO2: 95%  Weight: 248 lb (112.5 kg)  Height: 5\' 5"  (1.651 m)   RLE:   R 1st MT ulcer: previous punctate lesion larger than last time, no frank pus or erythema  medial calf incision: near totally contacted with pink granulation base,   toes demonstrate onychomyocosis  Neuro: RUE 3-4/5 mostly with hand grip 2-3/5, RLE not fully tested 2/5, continued expressive aphasia with some perseverance, hearing significantly worse   Medical Decision Making  [name] is a 75 y.o. 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  Dry dressing or Aquacel Ag to R 1st MT wound daily.  Wound check in 4 weeks  Continue SNF until wounds fully healed.  Continue rehab at SNF  Pt's arm and neck sx are worrisome for possible cervical HNP.  Will defer further evaluation to her PCP.  Would defer treatment of R foot onychomycosis until R leg wounds are healed.  Hearing loss is interfering with communication at this point.  Refer pt to ENT for further evaluation.  The foot wound is deteriorating raising concerns of osteomyelitis.  Will refer her to Dr. Sharol Given for further evaluation and management, as I have not been able to get the wound to fully heal.  Adele Barthel, MD, [place] [facility] [place] Office: [phone] Pager: [phone]

## 2016-05-29 ENCOUNTER — Encounter: Payer: Self-pay | Admitting: Vascular Surgery

## 2016-05-29 ENCOUNTER — Ambulatory Visit (INDEPENDENT_AMBULATORY_CARE_PROVIDER_SITE_OTHER): Payer: Medicare HMO | Admitting: Vascular Surgery

## 2016-05-29 VITALS — BP 103/54 | HR 77 | Temp 98.9°F | Resp 14 | Ht 65.0 in | Wt 248.0 lb

## 2016-05-29 DIAGNOSIS — I7025 Atherosclerosis of native arteries of other extremities with ulceration: Secondary | ICD-10-CM

## 2016-05-29 DIAGNOSIS — I63032 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: Secondary | ICD-10-CM

## 2016-05-29 DIAGNOSIS — H9193 Unspecified hearing loss, bilateral: Secondary | ICD-10-CM

## 2016-06-03 ENCOUNTER — Ambulatory Visit (HOSPITAL_COMMUNITY)
Admission: RE | Admit: 2016-06-03 | Discharge: 2016-06-03 | Disposition: A | Discharge status: Home / Self Care | Payer: Medicare HMO | Source: Ambulatory Visit | Attending: Interventional Radiology | Admitting: Interventional Radiology

## 2016-06-03 DIAGNOSIS — I639 Cerebral infarction, unspecified: Secondary | ICD-10-CM

## 2016-06-03 HISTORY — PX: IR GENERIC HISTORICAL: IMG1180011

## 2016-06-05 ENCOUNTER — Encounter (HOSPITAL_COMMUNITY): Payer: Self-pay | Admitting: Interventional Radiology

## 2016-06-05 DIAGNOSIS — H6123 Impacted cerumen, bilateral: Secondary | ICD-10-CM | POA: Insufficient documentation

## 2016-06-05 DIAGNOSIS — H903 Sensorineural hearing loss, bilateral: Secondary | ICD-10-CM | POA: Insufficient documentation

## 2016-06-12 ENCOUNTER — Ambulatory Visit (INDEPENDENT_AMBULATORY_CARE_PROVIDER_SITE_OTHER): Payer: Medicare HMO | Admitting: Orthopedic Surgery

## 2016-06-14 ENCOUNTER — Other Ambulatory Visit: Payer: Self-pay | Admitting: *Deleted

## 2016-06-14 DIAGNOSIS — I739 Peripheral vascular disease, unspecified: Secondary | ICD-10-CM

## 2016-06-14 DIAGNOSIS — Z48812 Encounter for surgical aftercare following surgery on the circulatory system: Secondary | ICD-10-CM

## 2016-06-18 ENCOUNTER — Inpatient Hospital Stay (HOSPITAL_COMMUNITY): Admission: RE | Admit: 2016-06-18 | Payer: Medicare HMO | Source: Ambulatory Visit

## 2016-06-18 ENCOUNTER — Encounter (HOSPITAL_COMMUNITY): Payer: Medicare HMO

## 2016-06-21 ENCOUNTER — Other Ambulatory Visit: Payer: Self-pay

## 2016-06-21 NOTE — Patient Outreach (Signed)
Telephone outreach to patient to obtain mRS was successfully completed with patient's caregiver at [facility], Scotty Court RN . mRS = [place], [place] Management Assistant

## 2016-07-01 ENCOUNTER — Emergency Department
Admission: EM | Admit: 2016-07-01 | Discharge: 2016-07-01 | Disposition: A | Discharge status: Home / Self Care | Payer: Medicare HMO | Attending: Emergency Medicine | Admitting: Emergency Medicine

## 2016-07-01 DIAGNOSIS — Z794 Long term (current) use of insulin: Secondary | ICD-10-CM | POA: Diagnosis not present

## 2016-07-01 DIAGNOSIS — E11621 Type 2 diabetes mellitus with foot ulcer: Secondary | ICD-10-CM | POA: Insufficient documentation

## 2016-07-01 DIAGNOSIS — I110 Hypertensive heart disease with heart failure: Secondary | ICD-10-CM | POA: Insufficient documentation

## 2016-07-01 DIAGNOSIS — M7989 Other specified soft tissue disorders: Secondary | ICD-10-CM | POA: Diagnosis present

## 2016-07-01 DIAGNOSIS — L97519 Non-pressure chronic ulcer of other part of right foot with unspecified severity: Secondary | ICD-10-CM | POA: Diagnosis not present

## 2016-07-01 DIAGNOSIS — Z79899 Other long term (current) drug therapy: Secondary | ICD-10-CM | POA: Diagnosis not present

## 2016-07-01 DIAGNOSIS — L03115 Cellulitis of right lower limb: Secondary | ICD-10-CM | POA: Insufficient documentation

## 2016-07-01 DIAGNOSIS — I509 Heart failure, unspecified: Secondary | ICD-10-CM | POA: Insufficient documentation

## 2016-07-01 LAB — CBC WITH DIFFERENTIAL/PLATELET
BASOS PCT: 2 %
Basophils Absolute: 0.2 10*3/uL — ABNORMAL HIGH (ref 0–0.1)
EOS ABS: 0.7 10*3/uL (ref 0–0.7)
Eosinophils Relative: 7 %
HEMATOCRIT: 38.6 % (ref 35.0–47.0)
Hemoglobin: 12.4 g/dL (ref 12.0–16.0)
LYMPHS ABS: 2.9 10*3/uL (ref 1.0–3.6)
Lymphocytes Relative: 29 %
MCH: 26.9 pg (ref 26.0–34.0)
MCHC: 32.2 g/dL (ref 32.0–36.0)
MCV: 83.6 fL (ref 80.0–100.0)
MONO ABS: 0.6 10*3/uL (ref 0.2–0.9)
MONOS PCT: 6 %
Neutro Abs: 5.6 10*3/uL (ref 1.4–6.5)
Neutrophils Relative %: 56 %
Platelets: 273 10*3/uL (ref 150–440)
RBC: 4.62 MIL/uL (ref 3.80–5.20)
RDW: 20 % — AB (ref 11.5–14.5)
WBC: 10 10*3/uL (ref 3.6–11.0)

## 2016-07-01 LAB — COMPREHENSIVE METABOLIC PANEL
ALBUMIN: 3.7 g/dL (ref 3.5–5.0)
ALK PHOS: 62 U/L (ref 38–126)
ALT: 29 U/L (ref 14–54)
AST: 29 U/L (ref 15–41)
Anion gap: 10 (ref 5–15)
BUN: 22 mg/dL — AB (ref 6–20)
CALCIUM: 9.4 mg/dL (ref 8.9–10.3)
CO2: 27 mmol/L (ref 22–32)
CREATININE: 0.81 mg/dL (ref 0.44–1.00)
Chloride: 102 mmol/L (ref 101–111)
GFR calc Af Amer: >60 mL/min (ref >60)
GFR calc non Af Amer: >60 mL/min (ref >60)
GLUCOSE: 99 mg/dL (ref 65–99)
Potassium: 4.2 mmol/L (ref 3.5–5.1)
SODIUM: 139 mmol/L (ref 135–145)
Total Bilirubin: 0.4 mg/dL (ref 0.3–1.2)
Total Protein: 7 g/dL (ref 6.5–8.1)

## 2016-07-01 MED ORDER — DOXYCYCLINE HYCLATE 100 MG PO CAPS: 100.0000 mg | ORAL_CAPSULE | Freq: Two times a day (BID) | ORAL | 0 refills | Status: DC

## 2016-07-01 MED ORDER — ACETAMINOPHEN 500 MG PO TABS
1000.0000 mg | ORAL_TABLET | Freq: Once | ORAL | Status: AC
  Administered 2016-07-01: 1000 mg via ORAL
  Filled 2016-07-01: qty 2

## 2016-07-01 MED ORDER — DOXYCYCLINE HYCLATE 100 MG PO TABS
100.0000 mg | ORAL_TABLET | Freq: Once | ORAL | Status: AC
  Administered 2016-07-01: 100 mg via ORAL
  Filled 2016-07-01: qty 1

## 2016-07-01 NOTE — ED Provider Notes (Signed)
[facility] Emergency Department Provider Note  ____________________________________________   First MD Initiated Contact with Patient 07/01/16 1634     (approximate)  I have reviewed the triage vital signs and the nursing notes.   HISTORY  Chief Complaint Wound Infection (pt has wound vac and skin is irritated around vaccuum.)   HPI [name] is a 75 y.o. female with a history of stroke as well as peripheral arterial disease was present. Emergency department today with erythema surrounding a wound VAC site. Per the patient as well as her husband is at the bedside the patient had a popliteal bypass graft done several months ago. There was a subsequent ulceration along the bypass graft and the patient required a wound VAC. Over the past week there has been an area of redness around the wound VAC and the leg is swollen. The patient husband deny any fevers. They say that one of the wound care nurses instructed them to go to the emergency department today because of possible infection. However, the patient as well as the husband are unclear about the exact reason that they were sent to the emergency department and gave me the 24 7 contact information for [facility]. The patient denies any pain.   Past Medical History:  Diagnosis Date  . Aortic stenosis   . Complication of anesthesia    Hard to wake patient up after having anesthesia  . Diabetes mellitus without complication ([place])   . Diabetic neuropathy (HCC)    Takes Lyrica  . Edema of both legs    Takes Lasix  . GERD (gastroesophageal reflux disease)   . High cholesterol   . HTN (hypertension)   . Hypertension   . Shortness of breath dyspnea   . Spasm of back muscles   . Wound, open    Right great toe    Patient Active Problem List   Diagnosis Date Noted  . Pleural effusion, bilateral   . Pulmonary congestion   . Wheeze   . Dyspnea   . CHF (congestive heart failure)  ([place])   . Right sided weakness   . Acute respiratory acidosis   . Surgery, elective   . Anemia, iron deficiency   . Leukocytosis   . Cerebrovascular accident (CVA) due to thrombosis of precerebral artery ([place]) 03/01/2016  . 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 Acute respiratory failure ([place])   . PAD (peripheral artery disease) ([place]) 02/28/2016  . Atherosclerosis of native arteries of the extremities with ulceration ([place]) 12/08/2015  . Open toe wound 05/04/2014  . Anemia, chronic disease 04/27/2014  . Occult blood positive stool 04/17/2014  . Edema 04/03/2014  . Unspecified hereditary and idiopathic peripheral neuropathy 03/14/2014  . Acute and chronic respiratory failure 02/14/2014  . Type II or unspecified type diabetes mellitus with neurological manifestations, uncontrolled(250.62) 02/14/2014  . Respiratory failure ([place]) 02/12/2014  . Altered mental status 02/12/2014  . Acute encephalopathy 02/12/2014  . Acute respiratory failure with hypercapnia ([place]) 02/12/2014  . Acute diastolic CHF (congestive heart failure) ([place]) 02/12/2014  . Aortic stenosis 02/04/2014  . Closed left subtrochanteric femur fracture ([place]) 02/03/2014  . Hip fracture, left ([place]) 02/03/2014  . Hypokalemia 02/03/2014  . Fibula fracture 02/03/2014  . MTP instability 02/03/2014  . Hip fracture ([place]) 02/03/2014  . HTN (hypertension)   . Type 2 diabetes mellitus without complication, without long-term current use of insulin ([place])   . HLD (hyperlipidemia)  Past Surgical History:  Procedure Laterality Date  . BYPASS GRAFT POPLITEAL TO TIBIAL Right 02/28/2016   Procedure: BYPASS GRAFT RIGHT BELOW KNEE POPLITEAL TO PERONEAL USING REVERSED RIGHT GREATER SAPHENOUS VEIN;  Surgeon: Conrad Mathiston, MD;  Location: [place];  Service: Vascular;  Laterality: Right;  . CYST EXCISION     Abdomen  . EYE SURGERY Bilateral    Cataract removal  . INTRAMEDULLARY (IM) NAIL  INTERTROCHANTERIC Left 02/04/2014   Procedure: INTRAMEDULLARY (IM) NAIL INTERTROCHANTRIC FEMORAL;  Surgeon: Mauri Pole, MD;  Location: [place];  Service: Orthopedics;  Laterality: Left;  . IR GENERIC HISTORICAL  03/01/2016   IR ANGIO INTRA EXTRACRAN SEL COM CAROTID INNOMINATE UNI R MOD SED 03/01/2016 [name], MD [facility]  . IR GENERIC HISTORICAL  03/01/2016   IR ENDOVASC INTRACRANIAL INF OTHER THAN THROMBO ART INC DIAG ANGIO 03/01/2016 [name], MD [facility]  . IR GENERIC HISTORICAL  03/01/2016   IR INTRAVSC STENT CERV CAROTID W/O EMB-PROT MOD SED INC ANGIO 03/01/2016 [name], MD [facility]  . IR GENERIC HISTORICAL  06/03/2016   IR RADIOLOGIST EVAL & MGMT 06/03/2016 [facility]  . ORIF TOE FRACTURE Right 02/08/2014   Procedure: OPEN REDUCTION INTERNAL FIXATION Right METATARSAL  FRACTURE ;  Surgeon: Wylene Simmer, MD;  Location: [facility];  Service: Orthopedics;  Laterality: Right;  . PERIPHERAL VASCULAR CATHETERIZATION N/A 12/28/2015   Procedure: Abdominal Aortogram w/Lower Extremity;  Surgeon: Conrad Hickman, MD;  Location: [facility];  Service: Cardiovascular;  Laterality: N/A;  . RADIOLOGY WITH ANESTHESIA N/A 03/01/2016   Procedure: RADIOLOGY WITH ANESTHESIA;  Surgeon: [name], MD;  Location: [facility];  Service: Radiology;  Laterality: N/A;  . VEIN HARVEST Right 02/28/2016   Procedure: RIGHT GREATER SAPHENOUS VEIN HARVEST;  Surgeon: Conrad Woodstock, MD;  Location: [facility];  Service: Vascular;  Laterality: Right;    Prior to Admission medications   Medication Sig Start Date End Date Taking? Authorizing Provider  acetaminophen (TYLENOL) 500 MG tablet Take 500 mg by mouth every 6 (six) hours as needed for mild pain.    Historical Provider, MD  albuterol (PROVENTIL HFA;VENTOLIN HFA) 108 (90 Base) MCG/ACT inhaler Inhale 2 puffs into the lungs every 6 (six) hours as needed.    Historical Provider, MD  amLODipine (NORVASC) 10 MG tablet Take 1 tablet (10 mg total)  by mouth daily. 03/13/16   Alvia Grove, PA-C  aspirin 325 MG tablet Take 1 tablet (325 mg total) by mouth daily with breakfast. 03/13/16   Alvia Grove, PA-C  atorvastatin (LIPITOR) 20 MG tablet Take 1 tablet (20 mg total) by mouth daily. 03/13/16   Alvia Grove, PA-C  B-D INS SYRINGE 0.5CC/31GX5/16 31G X 5/16" 0.5 ML MISC  02/05/16   Historical Provider, MD  cetirizine (ZYRTEC) 10 MG tablet Take 10 mg by mouth daily as needed for allergies.    Historical Provider, MD  ciprofloxacin (CIPRO) 500 MG tablet Take 1 tablet (500 mg total) by mouth 2 (two) times daily. Patient not taking: Reported on 05/29/2016 03/13/16   Alvia Grove, PA-C  clopidogrel (PLAVIX) 75 MG tablet Take 1 tablet (75 mg total) by mouth daily with breakfast. 03/13/16   Alvia Grove, PA-C  collagenase (SANTYL) ointment Apply topically 2 (two) times daily. Apply to dorsum right foot wound daily. 03/13/16   Alvia Grove, PA-C  docusate sodium (COLACE) 100 MG capsule Take 100 mg by mouth 2 (two) times daily.    Historical  Provider, MD  fluticasone (FLONASE) 50 MCG/ACT nasal spray Place 1 spray into both nostrils daily.    Historical Provider, MD  furosemide (LASIX) 40 MG tablet Take 80 mg by mouth 2 (two) times daily.  01/08/16   Historical Provider, MD  insulin aspart (NOVOLOG) 100 UNIT/ML injection Inject 0-15 Units into the skin 3 (three) times daily with meals. Before each meal 3 times a day, 140-199 - 3 units, 200-250 - 5 units, 251-299 - 8 units,  300-349 - 12 units,  350 or above 15 units. Patient taking differently: Inject 4 Units into the skin 2 (two) times daily.  02/09/14   Thurnell Lose, MD  insulin glargine (LANTUS) 100 UNIT/ML injection Inject 0.15 mLs (15 Units total) into the skin 2 (two) times daily. 03/13/16   Alvia Grove, PA-C  losartan (COZAAR) 100 MG tablet Take 1 tablet (100 mg total) by mouth daily. 02/13/14   Samuella Cota, MD  metoprolol tartrate (LOPRESSOR) 25 mg/10 mL SUSP Take 20 mLs (50 mg  total) by mouth 2 (two) times daily. 03/13/16   Alvia Grove, PA-C  omeprazole (PRILOSEC) 20 MG capsule Take 20 mg by mouth daily.    Historical Provider, MD  polyethylene glycol (MIRALAX / GLYCOLAX) packet Take 17 g by mouth 2 (two) times daily. 02/09/14   Thurnell Lose, MD  potassium chloride SA (K-DUR,KLOR-CON) 20 MEQ tablet Take 20 mEq by mouth daily.    Historical Provider, MD  pregabalin (LYRICA) 50 MG capsule Take 50 mg by mouth 3 (three) times daily.    Historical Provider, MD    Allergies Iodine; Penicillins; Shellfish allergy; and Sulfa antibiotics  Family History  Problem Relation Age of Onset  . Diabetes Other     Social History Social History  Substance Use Topics  . Smoking status: Never Smoker  . Smokeless tobacco: Never Used     Comment: Never smoked  . Alcohol use No    Review of Systems Constitutional: No fever/chills Eyes: No visual changes. ENT: No sore throat. Cardiovascular: Denies chest pain. Respiratory: Denies shortness of breath. Gastrointestinal: No abdominal pain.  No nausea, no vomiting.  No diarrhea.  No constipation. Genitourinary: Negative for dysuria. Musculoskeletal: Negative for back pain. Skin: Negative for rash. Neurological: Negative for headaches, focal weakness or numbness.  10-point ROS otherwise negative.  ____________________________________________   PHYSICAL EXAM:  VITAL SIGNS: ED Triage Vitals  Enc Vitals Group     BP 07/01/16 1518 (!) 193/62     Pulse Rate 07/01/16 1518 79     Resp 07/01/16 1518 20     Temp 07/01/16 1518 98 F (36.7 C)     Temp Source 07/01/16 1518 Oral     SpO2 07/01/16 1518 97 %     Weight --      Height --      Head Circumference --      Peak Flow --      Pain Score 07/01/16 1533 8     Pain Loc --      Pain Edu? --      Excl. in [place]? --     Constitutional: Alert and oriented. Well appearing and in no acute distress. Eyes: Conjunctivae are normal. PERRL. EOMI. Head: Atraumatic. Nose:  No congestion/rhinnorhea. Mouth/Throat: Mucous membranes are moist.   Neck: No stridor.   Cardiovascular: Normal rate, regular rhythm. Grossly normal heart sounds.  Good peripheral circulation With bilaterally intact dorsalis pedis pulses. Respiratory: Normal respiratory effort.  No retractions. Lungs  CTAB. Gastrointestinal: Soft and nontender. No distention. No abdominal bruits. No CVA tenderness. Musculoskeletal: bi lateral lower extremity edema which is moderate.  Wound VAC to the right, medial calf with about a 1 cm wheal of surrounding erythema. Mild induration without tenderness. No pus. No crepitus. Wound VAC foam is about 4 x 12 cm in length. Also with 1 L ulceration to the great toe dorsally without any surrounding induration, erythema or pus. No tenderness to palpation the patient is able to range her toes fully. Neurologic:  Normal speech and language. No gross focal neurologic deficits are appreciated. No gait instability. Skin:  Skin is warm, dry and intact. No rash noted. Psychiatric: Mood and affect are normal. Speech and behavior are normal.  ____________________________________________   LABS (all labs ordered are listed, but only abnormal results are displayed)  Labs Reviewed  COMPREHENSIVE METABOLIC PANEL  CBC WITH DIFFERENTIAL/PLATELET   ____________________________________________  EKG   ____________________________________________  RADIOLOGY   ____________________________________________   PROCEDURES  Procedure(s) performed:   Procedures  Critical Care performed:   ____________________________________________   INITIAL IMPRESSION / ASSESSMENT AND PLAN / ED COURSE  Pertinent labs & imaging results that were available during my care of the patient were reviewed by me and considered in my medical decision making (see chart for details).  ----------------------------------------- 5:27 PM on  07/01/2016 -----------------------------------------  Call made to the patient's home care service and I'm awaiting a callback at this time for further details.  Clinical Course   ----------------------------------------- 7:03 PM on 07/01/2016 ----------------------------------------- I discussed the case with the patient's home nurse, Margreta Journey, who saw the patient this past Friday. The plan at that time was to try to have the patient's wound care appointment moved up from this Friday to earlier in the week. However, the patient and family stated they were unable to do so. The family say that they discussed with a different nurse, Maudie Mercury, today who said to go to the emergency department for further evaluation. The family is saying that Maudie Mercury will not come up to change the dressing. However, Margreta Journey who I was speaking with on the phone, says that she will, personally viewed be tomorrow to change the dressing. I'll be starting the patient on by mouth antibiotics. The patient as well as the family regarding return precautions for any worsening of the symptoms including fever to come to the emergency department immediately for IV antibiotics. They're understanding of this plan and willing to comply. The patient also has a follow-up appointment this Thursday with her primary care doctor.    ____________________________________________   FINAL CLINICAL IMPRESSION(S) / ED DIAGNOSES  Cellulitis.    NEW MEDICATIONS STARTED DURING THIS VISIT:  New Prescriptions   No medications on file     Note:  This document was prepared using Dragon voice recognition software and may include unintentional dictation errors.    Orbie Pyo, MD 07/01/16 [phone]

## 2016-07-01 NOTE — ED Triage Notes (Signed)
Pt states she has a wound vac on an has been home for a week this past Saturday.. Pt states she has had increased redness around the wound in the past week.[name]

## 2016-07-01 NOTE — ED Notes (Signed)
Pt discharged to home.  Family member driving.  Discharge instructions reviewed.  Verbalized understanding.  No questions or concerns at this time.  Teach back verified.  Pt in NAD.  No items left in ED.   

## 2016-07-04 ENCOUNTER — Other Ambulatory Visit (HOSPITAL_COMMUNITY): Payer: Self-pay | Admitting: Neurological Surgery

## 2016-07-04 DIAGNOSIS — M4722 Other spondylosis with radiculopathy, cervical region: Secondary | ICD-10-CM

## 2016-07-05 ENCOUNTER — Emergency Department (HOSPITAL_COMMUNITY): Payer: Medicare HMO

## 2016-07-05 ENCOUNTER — Encounter: Payer: Self-pay | Admitting: Family

## 2016-07-05 ENCOUNTER — Ambulatory Visit: Payer: Medicare HMO | Admitting: Vascular Surgery

## 2016-07-05 ENCOUNTER — Encounter (HOSPITAL_COMMUNITY): Payer: Self-pay

## 2016-07-05 ENCOUNTER — Ambulatory Visit (INDEPENDENT_AMBULATORY_CARE_PROVIDER_SITE_OTHER): Payer: Medicare HMO | Admitting: Family

## 2016-07-05 ENCOUNTER — Inpatient Hospital Stay (HOSPITAL_COMMUNITY)
Admission: EM | Admit: 2016-07-05 | Discharge: 2016-07-09 | DRG: 292 | Disposition: A | Discharge status: Home Health | Payer: Medicare HMO | Attending: Internal Medicine | Admitting: Internal Medicine

## 2016-07-05 VITALS — BP 149/67 | Temp 97.6°F | Ht 65.0 in | Wt 210.0 lb

## 2016-07-05 DIAGNOSIS — Z888 Allergy status to other drugs, medicaments and biological substances status: Secondary | ICD-10-CM

## 2016-07-05 DIAGNOSIS — I110 Hypertensive heart disease with heart failure: Principal | ICD-10-CM | POA: Diagnosis present

## 2016-07-05 DIAGNOSIS — I69351 Hemiplegia and hemiparesis following cerebral infarction affecting right dominant side: Secondary | ICD-10-CM

## 2016-07-05 DIAGNOSIS — R000 Tachycardia, unspecified: Secondary | ICD-10-CM | POA: Diagnosis present

## 2016-07-05 DIAGNOSIS — I739 Peripheral vascular disease, unspecified: Secondary | ICD-10-CM | POA: Diagnosis present

## 2016-07-05 DIAGNOSIS — I248 Other forms of acute ischemic heart disease: Secondary | ICD-10-CM | POA: Diagnosis present

## 2016-07-05 DIAGNOSIS — K219 Gastro-esophageal reflux disease without esophagitis: Secondary | ICD-10-CM | POA: Diagnosis present

## 2016-07-05 DIAGNOSIS — I10 Essential (primary) hypertension: Secondary | ICD-10-CM | POA: Diagnosis present

## 2016-07-05 DIAGNOSIS — Z7401 Bed confinement status: Secondary | ICD-10-CM

## 2016-07-05 DIAGNOSIS — Z981 Arthrodesis status: Secondary | ICD-10-CM

## 2016-07-05 DIAGNOSIS — Z91041 Radiographic dye allergy status: Secondary | ICD-10-CM

## 2016-07-05 DIAGNOSIS — H9193 Unspecified hearing loss, bilateral: Secondary | ICD-10-CM | POA: Diagnosis not present

## 2016-07-05 DIAGNOSIS — I350 Nonrheumatic aortic (valve) stenosis: Secondary | ICD-10-CM | POA: Diagnosis present

## 2016-07-05 DIAGNOSIS — I6523 Occlusion and stenosis of bilateral carotid arteries: Secondary | ICD-10-CM | POA: Diagnosis present

## 2016-07-05 DIAGNOSIS — I63032 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: Secondary | ICD-10-CM | POA: Diagnosis not present

## 2016-07-05 DIAGNOSIS — Z8673 Personal history of transient ischemic attack (TIA), and cerebral infarction without residual deficits: Secondary | ICD-10-CM

## 2016-07-05 DIAGNOSIS — E1165 Type 2 diabetes mellitus with hyperglycemia: Secondary | ICD-10-CM | POA: Diagnosis present

## 2016-07-05 DIAGNOSIS — L97919 Non-pressure chronic ulcer of unspecified part of right lower leg with unspecified severity: Secondary | ICD-10-CM | POA: Diagnosis present

## 2016-07-05 DIAGNOSIS — I5033 Acute on chronic diastolic (congestive) heart failure: Secondary | ICD-10-CM | POA: Diagnosis present

## 2016-07-05 DIAGNOSIS — Z794 Long term (current) use of insulin: Secondary | ICD-10-CM

## 2016-07-05 DIAGNOSIS — I43 Cardiomyopathy in diseases classified elsewhere: Secondary | ICD-10-CM | POA: Diagnosis present

## 2016-07-05 DIAGNOSIS — Z882 Allergy status to sulfonamides status: Secondary | ICD-10-CM

## 2016-07-05 DIAGNOSIS — R079 Chest pain, unspecified: Secondary | ICD-10-CM | POA: Diagnosis not present

## 2016-07-05 DIAGNOSIS — Z91012 Allergy to eggs: Secondary | ICD-10-CM

## 2016-07-05 DIAGNOSIS — E119 Type 2 diabetes mellitus without complications: Secondary | ICD-10-CM

## 2016-07-05 DIAGNOSIS — I7025 Atherosclerosis of native arteries of other extremities with ulceration: Secondary | ICD-10-CM | POA: Diagnosis not present

## 2016-07-05 DIAGNOSIS — Z6836 Body mass index (BMI) 36.0-36.9, adult: Secondary | ICD-10-CM

## 2016-07-05 DIAGNOSIS — Z91013 Allergy to seafood: Secondary | ICD-10-CM

## 2016-07-05 DIAGNOSIS — E7800 Pure hypercholesterolemia, unspecified: Secondary | ICD-10-CM | POA: Diagnosis present

## 2016-07-05 DIAGNOSIS — Z79899 Other long term (current) drug therapy: Secondary | ICD-10-CM

## 2016-07-05 DIAGNOSIS — Z7982 Long term (current) use of aspirin: Secondary | ICD-10-CM

## 2016-07-05 DIAGNOSIS — I5031 Acute diastolic (congestive) heart failure: Secondary | ICD-10-CM | POA: Diagnosis present

## 2016-07-05 DIAGNOSIS — E11622 Type 2 diabetes mellitus with other skin ulcer: Secondary | ICD-10-CM | POA: Diagnosis present

## 2016-07-05 DIAGNOSIS — D509 Iron deficiency anemia, unspecified: Secondary | ICD-10-CM | POA: Diagnosis present

## 2016-07-05 DIAGNOSIS — Z880 Allergy status to penicillin: Secondary | ICD-10-CM

## 2016-07-05 DIAGNOSIS — Z95828 Presence of other vascular implants and grafts: Secondary | ICD-10-CM

## 2016-07-05 DIAGNOSIS — E785 Hyperlipidemia, unspecified: Secondary | ICD-10-CM | POA: Diagnosis present

## 2016-07-05 DIAGNOSIS — S92911A Unspecified fracture of right toe(s), initial encounter for closed fracture: Secondary | ICD-10-CM | POA: Diagnosis present

## 2016-07-05 DIAGNOSIS — M6281 Muscle weakness (generalized): Secondary | ICD-10-CM

## 2016-07-05 DIAGNOSIS — Z7902 Long term (current) use of antithrombotics/antiplatelets: Secondary | ICD-10-CM

## 2016-07-05 DIAGNOSIS — I2510 Atherosclerotic heart disease of native coronary artery without angina pectoris: Secondary | ICD-10-CM | POA: Diagnosis present

## 2016-07-05 DIAGNOSIS — E1140 Type 2 diabetes mellitus with diabetic neuropathy, unspecified: Secondary | ICD-10-CM | POA: Diagnosis present

## 2016-07-05 DIAGNOSIS — E1152 Type 2 diabetes mellitus with diabetic peripheral angiopathy with gangrene: Secondary | ICD-10-CM | POA: Diagnosis present

## 2016-07-05 DIAGNOSIS — E669 Obesity, unspecified: Secondary | ICD-10-CM | POA: Diagnosis present

## 2016-07-05 DIAGNOSIS — I69320 Aphasia following cerebral infarction: Secondary | ICD-10-CM

## 2016-07-05 DIAGNOSIS — I872 Venous insufficiency (chronic) (peripheral): Secondary | ICD-10-CM | POA: Diagnosis present

## 2016-07-05 HISTORY — DX: Peripheral vascular disease, unspecified: I73.9

## 2016-07-05 HISTORY — DX: Heart failure, unspecified: I50.9

## 2016-07-05 HISTORY — DX: Cerebral infarction, unspecified: I63.9

## 2016-07-05 LAB — I-STAT TROPONIN, ED: TROPONIN I, POC: 0.07 ng/mL (ref 0.00–0.08)

## 2016-07-05 LAB — TROPONIN I: Troponin I: 0.06 ng/mL (ref <0.03)

## 2016-07-05 LAB — CBC
HCT: 38 % (ref 36.0–46.0)
Hemoglobin: 12 g/dL (ref 12.0–15.0)
MCH: 26.5 pg (ref 26.0–34.0)
MCHC: 31.6 g/dL (ref 30.0–36.0)
MCV: 84.1 fL (ref 78.0–100.0)
Platelets: 245 10*3/uL (ref 150–400)
RBC: 4.52 MIL/uL (ref 3.87–5.11)
RDW: 18.4 % — ABNORMAL HIGH (ref 11.5–15.5)
WBC: 10 10*3/uL (ref 4.0–10.5)

## 2016-07-05 LAB — BASIC METABOLIC PANEL WITH GFR
Anion gap: 10 (ref 5–15)
BUN: 18 mg/dL (ref 6–20)
CO2: 24 mmol/L (ref 22–32)
Calcium: 9.6 mg/dL (ref 8.9–10.3)
Chloride: 106 mmol/L (ref 101–111)
Creatinine, Ser: 0.74 mg/dL (ref 0.44–1.00)
GFR calc Af Amer: >60 mL/min
GFR calc non Af Amer: >60 mL/min
Glucose, Bld: 206 mg/dL — ABNORMAL HIGH (ref 65–99)
Potassium: 4.5 mmol/L (ref 3.5–5.1)
Sodium: 140 mmol/L (ref 135–145)

## 2016-07-05 LAB — SEDIMENTATION RATE: SED RATE: 15 mm/h (ref 0–22)

## 2016-07-05 LAB — C-REACTIVE PROTEIN

## 2016-07-05 LAB — TSH: TSH: 1.054 u[IU]/mL (ref 0.350–4.500)

## 2016-07-05 LAB — GLUCOSE, CAPILLARY: GLUCOSE-CAPILLARY: 220 mg/dL — AB (ref 65–99)

## 2016-07-05 MED ORDER — VITAMIN D 1000 UNITS PO TABS
1000.0000 [IU] | ORAL_TABLET | Freq: Every day | ORAL | Status: DC
  Administered 2016-07-06 – 2016-07-09 (×4): 1000 [IU] via ORAL
  Filled 2016-07-05 (×4): qty 1

## 2016-07-05 MED ORDER — ATORVASTATIN CALCIUM 20 MG PO TABS
20.0000 mg | ORAL_TABLET | Freq: Every day | ORAL | Status: DC
  Administered 2016-07-05 – 2016-07-06 (×2): 20 mg via ORAL
  Filled 2016-07-05 (×2): qty 1

## 2016-07-05 MED ORDER — METOPROLOL TARTRATE 25 MG/10 ML ORAL SUSPENSION
50.0000 mg | Freq: Two times a day (BID) | ORAL | Status: DC
  Administered 2016-07-05 – 2016-07-06 (×2): 50 mg via ORAL
  Filled 2016-07-05 (×2): qty 20

## 2016-07-05 MED ORDER — PREGABALIN 25 MG PO CAPS
50.0000 mg | ORAL_CAPSULE | Freq: Three times a day (TID) | ORAL | Status: DC
  Administered 2016-07-05 – 2016-07-08 (×8): 50 mg via ORAL
  Filled 2016-07-05 (×8): qty 2

## 2016-07-05 MED ORDER — AMLODIPINE BESYLATE 10 MG PO TABS
10.0000 mg | ORAL_TABLET | Freq: Every day | ORAL | Status: DC
  Administered 2016-07-05 – 2016-07-09 (×5): 10 mg via ORAL
  Filled 2016-07-05 (×5): qty 1

## 2016-07-05 MED ORDER — CLOPIDOGREL BISULFATE 75 MG PO TABS
75.0000 mg | ORAL_TABLET | Freq: Every day | ORAL | Status: DC
  Administered 2016-07-06 – 2016-07-09 (×4): 75 mg via ORAL
  Filled 2016-07-05 (×4): qty 1

## 2016-07-05 MED ORDER — LOSARTAN POTASSIUM 50 MG PO TABS
100.0000 mg | ORAL_TABLET | Freq: Every day | ORAL | Status: DC
  Administered 2016-07-05 – 2016-07-09 (×5): 100 mg via ORAL
  Filled 2016-07-05 (×5): qty 2

## 2016-07-05 MED ORDER — FLUTICASONE PROPIONATE 50 MCG/ACT NA SUSP
1.0000 | Freq: Every day | NASAL | Status: DC
  Administered 2016-07-06 – 2016-07-09 (×4): 1 via NASAL
  Filled 2016-07-05: qty 16

## 2016-07-05 MED ORDER — ALBUTEROL SULFATE (2.5 MG/3ML) 0.083% IN NEBU: 2.5000 mg | INHALATION_SOLUTION | Freq: Four times a day (QID) | RESPIRATORY_TRACT | Status: DC | PRN

## 2016-07-05 MED ORDER — FUROSEMIDE 10 MG/ML IJ SOLN
80.0000 mg | Freq: Once | INTRAMUSCULAR | Status: AC
  Administered 2016-07-05: 80 mg via INTRAVENOUS
  Filled 2016-07-05: qty 8

## 2016-07-05 MED ORDER — INSULIN GLARGINE 100 UNIT/ML ~~LOC~~ SOLN
15.0000 [IU] | Freq: Two times a day (BID) | SUBCUTANEOUS | Status: DC
  Administered 2016-07-05 – 2016-07-09 (×7): 15 [IU] via SUBCUTANEOUS
  Filled 2016-07-05 (×9): qty 0.15

## 2016-07-05 MED ORDER — ACETAMINOPHEN 500 MG PO TABS
500.0000 mg | ORAL_TABLET | Freq: Four times a day (QID) | ORAL | Status: DC | PRN
  Administered 2016-07-05 – 2016-07-08 (×9): 500 mg via ORAL
  Filled 2016-07-05 (×9): qty 1

## 2016-07-05 MED ORDER — ASPIRIN 325 MG PO TABS
325.0000 mg | ORAL_TABLET | Freq: Every day | ORAL | Status: DC
  Administered 2016-07-06 – 2016-07-09 (×4): 325 mg via ORAL
  Filled 2016-07-05 (×4): qty 1

## 2016-07-05 MED ORDER — DOCUSATE SODIUM 100 MG PO CAPS
100.0000 mg | ORAL_CAPSULE | Freq: Two times a day (BID) | ORAL | Status: DC
  Administered 2016-07-05 – 2016-07-09 (×7): 100 mg via ORAL
  Filled 2016-07-05 (×9): qty 1

## 2016-07-05 MED ORDER — CITALOPRAM HYDROBROMIDE 20 MG PO TABS
20.0000 mg | ORAL_TABLET | Freq: Every day | ORAL | Status: DC
  Administered 2016-07-05 – 2016-07-09 (×5): 20 mg via ORAL
  Filled 2016-07-05 (×5): qty 1

## 2016-07-05 MED ORDER — POLYETHYLENE GLYCOL 3350 17 G PO PACK
17.0000 g | PACK | Freq: Two times a day (BID) | ORAL | Status: DC
  Administered 2016-07-06 – 2016-07-08 (×2): 17 g via ORAL
  Filled 2016-07-05 (×4): qty 1

## 2016-07-05 MED ORDER — POTASSIUM CHLORIDE CRYS ER 20 MEQ PO TBCR
20.0000 meq | EXTENDED_RELEASE_TABLET | Freq: Every day | ORAL | Status: DC
  Administered 2016-07-06 – 2016-07-09 (×4): 20 meq via ORAL
  Filled 2016-07-05 (×4): qty 1

## 2016-07-05 MED ORDER — LORATADINE 10 MG PO TABS
10.0000 mg | ORAL_TABLET | Freq: Every day | ORAL | Status: DC
  Administered 2016-07-06 – 2016-07-09 (×4): 10 mg via ORAL
  Filled 2016-07-05 (×4): qty 1

## 2016-07-05 MED ORDER — INSULIN ASPART 100 UNIT/ML ~~LOC~~ SOLN
0.0000 [IU] | Freq: Three times a day (TID) | SUBCUTANEOUS | Status: DC
  Administered 2016-07-06: 5 [IU] via SUBCUTANEOUS
  Administered 2016-07-06: 2 [IU] via SUBCUTANEOUS
  Administered 2016-07-06: 3 [IU] via SUBCUTANEOUS
  Administered 2016-07-07: 8 [IU] via SUBCUTANEOUS
  Administered 2016-07-07: 3 [IU] via SUBCUTANEOUS
  Administered 2016-07-08: 11 [IU] via SUBCUTANEOUS
  Administered 2016-07-08: 2 [IU] via SUBCUTANEOUS
  Administered 2016-07-08: 3 [IU] via SUBCUTANEOUS
  Administered 2016-07-09: 5 [IU] via SUBCUTANEOUS
  Administered 2016-07-09: 3 [IU] via SUBCUTANEOUS
  Administered 2016-07-09: 2 [IU] via SUBCUTANEOUS

## 2016-07-05 MED ORDER — ENOXAPARIN SODIUM 40 MG/0.4ML ~~LOC~~ SOLN
40.0000 mg | SUBCUTANEOUS | Status: DC
  Administered 2016-07-05 – 2016-07-08 (×4): 40 mg via SUBCUTANEOUS
  Filled 2016-07-05 (×4): qty 0.4

## 2016-07-05 MED ORDER — ACETAMINOPHEN 325 MG PO TABS
650.0000 mg | ORAL_TABLET | Freq: Once | ORAL | Status: AC
  Administered 2016-07-05: 650 mg via ORAL
  Filled 2016-07-05: qty 2

## 2016-07-05 MED ORDER — LOSARTAN POTASSIUM 50 MG PO TABS: 100.0000 mg | ORAL_TABLET | Freq: Every day | ORAL | Status: DC

## 2016-07-05 MED ORDER — ASPIRIN 81 MG PO CHEW
81.0000 mg | CHEWABLE_TABLET | Freq: Every day | ORAL | Status: DC
Start: 2016-07-05 — End: 2016-07-05

## 2016-07-05 MED ORDER — PANTOPRAZOLE SODIUM 40 MG PO TBEC
40.0000 mg | DELAYED_RELEASE_TABLET | Freq: Every day | ORAL | Status: DC
  Administered 2016-07-06 – 2016-07-09 (×4): 40 mg via ORAL
  Filled 2016-07-05 (×4): qty 1

## 2016-07-05 NOTE — ED Triage Notes (Signed)
Pt. Reports having lt. Chest pressure began over 1 week ago.    She reports that it radiates down her lt. Arm at times and she is having sob with it.    She also has a rt. Leg wound.     Bilateral swelling to her legs and her hands.    She had been in rehab to walk again.  She has been very weak.  Alert and oriented.  ECG completed in Triage   .

## 2016-07-05 NOTE — ED Notes (Signed)
Family at bedside. 

## 2016-07-05 NOTE — H&P (Signed)
Date: 07/05/2016               Patient Name:  [name] MRN: [id number]  DOB: 07-14-1941 Age / Sex: 75 y.o., female   PCP: [name], MD              Medical Service: Internal Medicine Teaching Service              Attending Physician: Dr. Oval Linsey, MD    First Contact: Jaquelyn Bitter, MS3 Pager: [phone]  Second Contact: Dr. Gay Filler Pager: [phone]  Third Contact Dr. Marlowe Sax Pager: [phone]       After Hours (After 5p/  First Contact Pager: [phone]  weekends / holidays): Second Contact Pager: [phone]   Chief Complaint: Chest pain  History of Present Illness:  [name] is a 75 y.o. female with a history of PAD, CVA, [place], DM who presents after being seen earlier today for a vascular surgery follow-up appointment and sent to [facility] ED for atypical chest pain and for evaluation of her right lower leg and foot for possible osteomyelitis and cellulitis. She has had 1 week of intermittent left-sided chest pain that she describes as heavy/tight, sometimes in the setting of fluttering in her chest. The pain mainly occurring at morning and at night with some discomfort radiating up her neck, and is different from the pain she feels from acid reflux. She also complains of numbness radiating down her left arm and numbness and weakness on her right side. (Of note, she had a peri-procedural stroke in 03/01/2016 following a popliteal artery to peroneal artery bypass in which she was found to have a left sided gaze preference, aphasia and right sided hemiplegia. She has had right sided weakness since.) She typically uses 2 pillows at night and cannot lie flat, but has noticed some increased swelling in her legs from not taking her medication today. She otherwise denies dyspnea or chest pain on exertion, pleuritic chest pain, chest tenderness to palpation, headaches, lightheadedness, and syncope. She is not aware of any thyroid problems.   Patient is s/p right below-the-knee popliteal artery to  peroneal artery bypass with reversed greater saphenous vein and endarterectomy of mid-segment peroneal artery on 02/28/16. She reports that the wound over her 1st metatarsal that has been present since before her procedure has gotten more painful and has not healed. Her right calf incision site wound has also had trouble healing and required a VAC dressing which was removed today at her office visit. She denies fever or purulent drainage.  SPECT (on 01/31/16 at [facility]): SPECT images demonstrated homogeneous tracer distribution throughout the myocardium.There was significant artifact from the apical wall and there was some motion artifact of the inferior wall. The possibility of minimal myocardial ischemia could not be ruled out. Resting images were normal. The overall quality of the study was poor.  Echo (03/02/16): EF 55-60%, mild aortic stenosis, grade 2 diastolic dysfunction  Meds:  Current Meds  Medication Sig  . acetaminophen (TYLENOL) 500 MG tablet Take 500 mg by mouth every 6 (six) hours as needed for mild pain.  [name] albuterol (PROVENTIL HFA;VENTOLIN HFA) 108 (90 Base) MCG/ACT inhaler Inhale 2 puffs into the lungs every 6 (six) hours as needed.  [name] amLODipine (NORVASC) 10 MG tablet Take 1 tablet (10 mg total) by mouth daily.  [name] aspirin 81 MG chewable tablet Chew 81 mg by mouth daily.  [name] atorvastatin (LIPITOR) 20 MG tablet Take 1 tablet (20 mg total) by mouth daily.  [name]  B-D INS SYRINGE 0.5CC/31GX5/16 31G X 5/16" 0.5 ML MISC   . cetirizine (ZYRTEC) 10 MG tablet Take 10 mg by mouth daily.   . cholecalciferol (VITAMIN D) 1000 units tablet Take 1,000 Units by mouth daily.  . citalopram (CELEXA) 20 MG tablet Take 20 mg by mouth daily.  . clopidogrel (PLAVIX) 75 MG tablet Take 1 tablet (75 mg total) by mouth daily with breakfast.  . collagenase (SANTYL) ointment Apply topically 2 (two) times daily. Apply to dorsum right foot wound daily.  [name] docusate sodium (COLACE) 100 MG capsule Take 100 mg by mouth  2 (two) times daily.  [name] doxycycline (VIBRAMYCIN) 100 MG capsule Take 1 capsule (100 mg total) by mouth 2 (two) times daily.  . fluticasone (FLONASE) 50 MCG/ACT nasal spray Place 1 spray into both nostrils daily.  . furosemide (LASIX) 40 MG tablet Take 80 mg by mouth 2 (two) times daily.   . insulin aspart (NOVOLOG) 100 UNIT/ML injection Inject 0-15 Units into the skin 3 (three) times daily with meals. Before each meal 3 times a day, 140-199 - 3 units, 200-250 - 5 units, 251-299 - 8 units,  300-349 - 12 units,  350 or above 15 units. (Patient taking differently: Inject 12 Units into the skin 3 (three) times daily with meals. )  . insulin glargine (LANTUS) 100 UNIT/ML injection Inject 0.15 mLs (15 Units total) into the skin 2 (two) times daily. (Patient taking differently: Inject 30 Units into the skin at bedtime. )  . losartan (COZAAR) 100 MG tablet Take 1 tablet (100 mg total) by mouth daily.  [name] omeprazole (PRILOSEC) 20 MG capsule Take 20 mg by mouth daily.  . polyethylene glycol (MIRALAX / GLYCOLAX) packet Take 17 g by mouth 2 (two) times daily. (Patient taking differently: Take 17 g by mouth daily as needed for mild constipation. )  . potassium chloride SA (K-DUR,KLOR-CON) 20 MEQ tablet Take 20 mEq by mouth daily.  . pregabalin (LYRICA) 50 MG capsule Take 50 mg by mouth 3 (three) times daily.   Allergies: Allergies as of 07/05/2016 - Review Complete 07/05/2016  Allergen Reaction Noted  . Iodine Anaphylaxis 02/03/2014  . Penicillins Anaphylaxis 02/03/2014  . Shellfish allergy Anaphylaxis 02/03/2014  . Shellfish-derived products Anaphylaxis 07/26/2014  . Sulfa antibiotics Anaphylaxis 02/03/2014  . Sulfacetamide sodium Anaphylaxis 07/26/2014  . Sulfasalazine Anaphylaxis 02/03/2014  . Atorvastatin  07/26/2014  . 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 Aortic stenosis   . Complication of anesthesia    Hard to wake patient up after having anesthesia  . Diabetes mellitus without complication ([place])   . Diabetic neuropathy (HCC)    Takes Lyrica  . Edema of both legs    Takes Lasix  . GERD (gastroesophageal reflux disease)   . High cholesterol   . HTN (hypertension)   . Hypertension   . Shortness of breath dyspnea   . Spasm of back muscles   . Wound, open    Right great toe   Family History: Family History  Problem Relation Age of Onset  . Diabetes Other    Social History: Social History   Social History  .  Marital status: Married    Spouse name: N/A  . Number of children: N/A  . Years of education: N/A   Social History Main Topics  . Smoking status: Never Smoker  . Smokeless tobacco: Never Used     Comment: Never smoked  . Alcohol use No  . Drug use: No  . Sexual activity: No   Other Topics Concern  . None   Social History Narrative  . None   Review of Systems: Constitutional: negative for fatigue, fevers and weight loss HENT: positive for hoarseness, negative for nasal congestion, sore mouth and sore throat, complains of hearing loss Eyes: positive for blurriness, requires glasses  Resp: negative for cough, dyspnea on exertion, pleurisy/chest pain and wheezing CV: positive for chest pressure/discomfort, palpitations and sleeps with 2 pillows, negative for fatigue, near-syncope, syncope, exertional chest pain, dyspnea, dyspnea on exertion GI: positive for abdominal pain, negative for constipation, diarrhea, nausea and vomiting GU: negative for dysuria, hematuria, increased frequency Skin: non-healing wound on right foot, pain  along scar from procedure on right medial leg Musculoskeletal: right sided muscle weakness Neurological: numbness along left and right arm  Vitals: BP 178/65   Pulse 79   Temp 97.8 F (36.6 C) (Oral)   Resp 15   Ht 5\' 4"  (1.626 m)   Wt 95.3 kg (210 lb)   SpO2 100%   BMI 36.05 kg/m   Physical Exam  Constitutional: She is oriented to person, place, and time.  Obese, laying at ~45  Degrees, no acute distress  HENT:  Head: Normocephalic and atraumatic.  Mouth/Throat: Uvula is midline, oropharynx is clear and moist and mucous membranes are normal.  Eyes: Conjunctivae and EOM are normal. Pupils are equal, round, and reactive to light.  Neck: Neck supple. JVD (to midneck at 45 degrees) present. Carotid bruit is not present.  Cardiovascular: Normal rate, regular rhythm, S1 normal and S2 normal.  Exam reveals decreased pulses (in lower extremities).   Murmur heard.  Systolic (loudest at RUSB) murmur is present with a grade of 3/6  2+ pitting edema up to thigh  Pulmonary/Chest: Effort normal.  Bibasilar crackles  Abdominal: Soft. Bowel sounds are normal. There is no hepatosplenomegaly. There is tenderness in the epigastric area.  Musculoskeletal:  Decreased strength on right side  Lymphadenopathy:    She has no cervical adenopathy.  Neurological: She is oriented to person, place, and time.  Decreased sensation on left face, notable facial weakness. CNs otherwise grossly intact  Skin:  Melanocytic nevi scattered across upper body and face. Scar along medial right calf from prior procedure with surrounding erythema. Non-healing wound and duskiness on dorsal surface of 1st metatarsal.   Lab results: Results for orders placed or performed during the hospital encounter of 07/05/16 (from the past 24 hour(s))  Basic metabolic panel     Status: Abnormal   Collection Time: 07/05/16 11:29 AM  Result Value Ref Range   Sodium 140 135 - 145 mmol/L   Potassium 4.5 3.5 - 5.1 mmol/L   Chloride  106 101 - 111 mmol/L   CO2 24 22 - 32 mmol/L   Glucose, Bld 206 (H) 65 - 99 mg/dL   BUN 18 6 - 20 mg/dL   Creatinine, Ser 0.74 0.44 - 1.00 mg/dL   Calcium 9.6 8.9 - 10.3 mg/dL   GFR calc non Af Amer >60 >60 mL/min   GFR calc Af Amer >60 >60 mL/min   Anion gap 10 5 - 15  CBC  Status: Abnormal   Collection Time: 07/05/16 11:29 AM  Result Value Ref Range   WBC 10.0 4.0 - 10.5 K/uL   RBC 4.52 3.87 - 5.11 MIL/uL   Hemoglobin 12.0 12.0 - 15.0 g/dL   HCT 38.0 36.0 - 46.0 %   MCV 84.1 78.0 - 100.0 fL   MCH 26.5 26.0 - 34.0 pg   MCHC 31.6 30.0 - 36.0 g/dL   RDW 18.4 (H) 11.5 - 15.5 %   Platelets 245 150 - 400 K/uL  I-stat troponin, ED     Status: None   Collection Time: 07/05/16 11:55 AM  Result Value Ref Range   Troponin i, poc 0.07 0.00 - 0.08 ng/mL   Comment 3          C-reactive protein     Status: None   Collection Time: 07/05/16 12:59 PM  Result Value Ref Range   CRP <0.8 <1.0 mg/dL  Sedimentation rate     Status: None   Collection Time: 07/05/16  1:58 PM  Result Value Ref Range   Sed Rate 15 0 - 22 mm/hr  TSH     Status: None   Collection Time: 07/05/16  4:40 PM  Result Value Ref Range   TSH 1.054 0.350 - 4.500 uIU/mL   Imaging results:  - CXR: Patchy changes in the lungs bilaterally but improved from the prior xray - Right foot xray: No definitive osteomyelitis is noted. There are changes at the first MTP joint suggestive of prior fusion. Soft tissue changes consistent with localized infection - Right Tibia/Fibula: Considerable soft tissue changes are noted consistent with the underlying cellulitis. No findings to suggest osteomyelitis are seen.  Other results: - EKG: normal sinus, flattened T waves in lateral leads  Assessment & Plan by Problem: Active Problems:   HTN (hypertension)   Type 2 diabetes mellitus without complication, without long-term current use of insulin (HCC)   Acute diastolic CHF (congestive heart failure) (HCC)   PAD (peripheral artery  disease) (HCC)   Chest pain  Chest pain: Differential for chest pain in this patient includes CAD, PE, pneumonia, and GERD.  Patient presents with atypical chest pain, with normal troponin and unremarkable EKG and CXR. CAD is the most likely cause of the patient's chest pain. SPECT in 01/2016 showed possibility of minimal myocardial ischemia; however the quality of the study was poor. Low suspicion for PE given history and objective findings. Pneumonia unlikely given relatively benign CXR and lack of other symptoms to suggest otherwise. Per patient, the quality of the her chest pain is unlike that of GERD - Trend troponin q6h - f/u ECG in the morning - Repeat stress test - continue home medication: Aspirin, Plavix, statin  Non-healing lower extremity wounds: Unlikely osteomyelitis given that she is afebrile w/out leukocytosis and no signs of infection. Radiographic findings negative. Wound VAC was removed today. She was started on doxycycline today prior to admission. Wound likely not healing due to poor perfusion from patients PAD. - Wound care consult - Monitor for fever, drainage, and developing leukocytosis - Wlll refer to Dr. Sharol Given for management  HFpEF: Echo (03/02/16): EF 55-60%, mild aortic stenosis, grade 2 diastolic dysfunction. Missed lasix dose today. Patient has signs of volume overload, including increased lower extremity swelling. - One dose of IV lasix 80mg , continue PO lasix 80 mg BID after  Hypertension:  - Continue home meds: amlodipine 10mg , losartan 100mg , metoprolol tartrate 50 mg BID - IV hydralazine 5mg  PRN BP>180/100  DM: Last A1c 7.7 (  03/02/2016), glucose 206 today - Repeat A1c - Continue home medication: Lantus 15U qHS, Aspart 12U qAS - SSI  Diabetic neuropathy - Continue pregabalin 50 mg TID, Citalopram 20 mg daily  GERD: - pantoprazole 40mg  daily  FEN/GI:  - Vit D, KCl tablets - Miralax 17g BID - Heart healthy diet  DVT ppx: Lovenox 40mg  IV  Code: Full  code  This is a Careers information officer Note.  The care of the patient was discussed with Dr. Eppie Gibson and the assessment and plan was formulated with his assistance.  Please see his note for official documentation of the patient encounter.   Signed: Armen Pickup, Medical Student 07/05/2016, 4:34 PM

## 2016-07-05 NOTE — Progress Notes (Signed)
[facility] [place]   CC: Follow up peripheral artery occlusive disease  History of Present Illness [name] is a 75 y.o. female patient of Dr.Chen who is s/p   1. Right below-the-knee popliteal artery to peroneal artery bypass with reversed greater saphenous vein  2. Endarterectomy of mid-segment peroneal artery(Date: 02/28/16).   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 The patient has been in rehab since discharge from the hospital.  The patient's wounds are nothealed. The patient was converted to a VAC dressing for the calf. The R 1st MT wound had healed but opened back up.  The patient's neurologic deficits reportedly are better but she still has weakness in R side with inability to walk and continued problems with word findings. Additionally, pt now is having B arm pain (R>L) with para/anesthesia in both hands. This is associated with increasing neck pain.  The patient's hearing has also worsened.   Pt last saw Dr. Bridgett Larsson on 05-29-16. At that time   [facility] to proceed with wet-to-dry dressing for the R calf daily.  Dry dressing or Aquacel Ag to R 1st MT wound daily.  Wound check in 4 weeks  Continue SNF until wounds fully healed.  Continue rehab at SNF  Pt's arm and neck sx are worrisome for possible cervical HNP. Will defer further evaluation to her PCP.  Would defer treatment of R foot onychomycosis until R leg wounds are healed.  Hearing loss is interfering with communication at this point.  Refer pt to ENT for further evaluation. The foot wound is deteriorating raising concerns of osteomyelitis.  Will refer her to Dr. Sharol Given for further evaluation and management, as Dr. Bridgett Larsson has not been able to get the wound to fully heal.  Pt presented to [facility] ED on 07-01-16. The ED physician discussed the case with the patient's home nurse, Margreta Journey, who  saw the patient this past Friday. The plan at that time was to try to have the patient's wound care appointment moved up from this Friday to earlier in the week. However, the patient and family stated they were unable to do so. The family say that they discussed with a different nurse, Maudie Mercury, today who said to go to the emergency department for further evaluation. The family is saying that Maudie Mercury will not come up to change the dressing. However, Margreta Journey who I was speaking with on the phone, says that she will, personally viewed be tomorrow to change the dressing. I'll be starting the patient on by mouth antibiotics. The patient as well as the family regarding return precautions for any worsening of the symptoms including fever to come to the emergency department immediately for IV antibiotics. They're understanding of this plan and willing to comply. The patient also has a follow-up appointment this Thursday with her primary care doctor. Pt was started on doxycycline 100 mg po bid at this visit.   Pt Diabetic: Yes Pt smoker: non-smoker  Pt meds include: Statin :Yes Betablocker: Yes ASA: Yes Other anticoagulants/antiplatelets: Plavix  Past Medical History:  Diagnosis Date  . Aortic stenosis   . Complication of anesthesia    Hard to wake patient up after having anesthesia  . Diabetes mellitus without complication ([place])   . Diabetic neuropathy (HCC)    Takes Lyrica  . Edema of both legs    Takes Lasix  . GERD (gastroesophageal reflux disease)   .  High cholesterol   . HTN (hypertension)   . Hypertension   . Shortness of breath dyspnea   . Spasm of back muscles   . Wound, open    Right great toe    Social History Social History  Substance Use Topics  . Smoking status: Never Smoker  . Smokeless tobacco: Never Used     Comment: Never smoked  . Alcohol use No    Family History Family History  Problem Relation Age of Onset  . Diabetes Other     Past Surgical History:  Procedure  Laterality Date  . BYPASS GRAFT POPLITEAL TO TIBIAL Right 02/28/2016   Procedure: BYPASS GRAFT RIGHT BELOW KNEE POPLITEAL TO PERONEAL USING REVERSED RIGHT GREATER SAPHENOUS VEIN;  Surgeon: Conrad Monsey, MD;  Location: [place];  Service: Vascular;  Laterality: Right;  . CYST EXCISION     Abdomen  . EYE SURGERY Bilateral    Cataract removal  . INTRAMEDULLARY (IM) NAIL INTERTROCHANTERIC Left 02/04/2014   Procedure: INTRAMEDULLARY (IM) NAIL INTERTROCHANTRIC FEMORAL;  Surgeon: Mauri Pole, MD;  Location: [place];  Service: Orthopedics;  Laterality: Left;  . IR GENERIC HISTORICAL  03/01/2016   IR ANGIO INTRA EXTRACRAN SEL COM CAROTID INNOMINATE UNI R MOD SED 03/01/2016 [name], MD [facility]  . IR GENERIC HISTORICAL  03/01/2016   IR ENDOVASC INTRACRANIAL INF OTHER THAN THROMBO ART INC DIAG ANGIO 03/01/2016 [name], MD [facility]  . IR GENERIC HISTORICAL  03/01/2016   IR INTRAVSC STENT CERV CAROTID W/O EMB-PROT MOD SED INC ANGIO 03/01/2016 [name], MD [facility]  . IR GENERIC HISTORICAL  06/03/2016   IR RADIOLOGIST EVAL & MGMT 06/03/2016 [facility]  . ORIF TOE FRACTURE Right 02/08/2014   Procedure: OPEN REDUCTION INTERNAL FIXATION Right METATARSAL  FRACTURE ;  Surgeon: Wylene Simmer, MD;  Location: [place];  Service: Orthopedics;  Laterality: Right;  . PERIPHERAL VASCULAR CATHETERIZATION N/A 12/28/2015   Procedure: Abdominal Aortogram w/Lower Extremity;  Surgeon: Conrad Benedict, MD;  Location: [place] [facility];  Service: Cardiovascular;  Laterality: N/A;  . RADIOLOGY WITH ANESTHESIA N/A 03/01/2016   Procedure: RADIOLOGY WITH ANESTHESIA;  Surgeon: [name], MD;  Location: [place];  Service: Radiology;  Laterality: N/A;  . VEIN HARVEST Right 02/28/2016   Procedure: RIGHT GREATER SAPHENOUS VEIN HARVEST;  Surgeon: Conrad Benwood, MD;  Location: [place];  Service: Vascular;  Laterality: Right;    Allergies  Allergen Reactions  . Iodine Anaphylaxis  . Penicillins  Anaphylaxis  . Shellfish Allergy Anaphylaxis  . Shellfish-Derived Products Anaphylaxis  . Sulfa Antibiotics Anaphylaxis  . Sulfacetamide Sodium Anaphylaxis  . Sulfasalazine Anaphylaxis  . Atorvastatin     Other reaction(s): Unknown  . Eggs Or Egg-Derived Products     Other reaction(s): SHORTNESS OF BREATH    Current Outpatient Prescriptions  Medication Sig Dispense Refill  . acetaminophen (TYLENOL) 500 MG tablet Take 500 mg by mouth every 6 (six) hours as needed for mild pain.    [name] albuterol (PROVENTIL HFA;VENTOLIN HFA) 108 (90 Base) MCG/ACT inhaler Inhale 2 puffs into the lungs every 6 (six) hours as needed.    [name] amLODipine (NORVASC) 10 MG tablet Take 1 tablet (10 mg total) by mouth daily. 30 tablet 3  . aspirin 325 MG tablet Take 1 tablet (325 mg total) by mouth daily with breakfast. 30 tablet 11  . atorvastatin (LIPITOR) 20 MG tablet Take 1 tablet (20 mg total) by mouth daily. 30 tablet 11  . B-D INS SYRINGE  0.5CC/31GX5/16 31G X 5/16" 0.5 ML MISC     . cetirizine (ZYRTEC) 10 MG tablet Take 10 mg by mouth daily as needed for allergies.    . ciprofloxacin (CIPRO) 500 MG tablet Take 1 tablet (500 mg total) by mouth 2 (two) times daily. (Patient not taking: Reported on 05/29/2016) 28 tablet 0  . clopidogrel (PLAVIX) 75 MG tablet Take 1 tablet (75 mg total) by mouth daily with breakfast. 30 tablet 3  . collagenase (SANTYL) ointment Apply topically 2 (two) times daily. Apply to dorsum right foot wound daily. 15 g 0  . docusate sodium (COLACE) 100 MG capsule Take 100 mg by mouth 2 (two) times daily.    [name] doxycycline (VIBRAMYCIN) 100 MG capsule Take 1 capsule (100 mg total) by mouth 2 (two) times daily. 20 capsule 0  . fluticasone (FLONASE) 50 MCG/ACT nasal spray Place 1 spray into both nostrils daily.    . furosemide (LASIX) 40 MG tablet Take 80 mg by mouth 2 (two) times daily.     . insulin aspart (NOVOLOG) 100 UNIT/ML injection Inject 0-15 Units into the skin 3 (three) times daily with  meals. Before each meal 3 times a day, 140-199 - 3 units, 200-250 - 5 units, 251-299 - 8 units,  300-349 - 12 units,  350 or above 15 units. (Patient taking differently: Inject 4 Units into the skin 2 (two) times daily. ) 10 mL 11  . insulin glargine (LANTUS) 100 UNIT/ML injection Inject 0.15 mLs (15 Units total) into the skin 2 (two) times daily. 10 mL 11  . losartan (COZAAR) 100 MG tablet Take 1 tablet (100 mg total) by mouth daily.    . metoprolol tartrate (LOPRESSOR) 25 mg/10 mL SUSP Take 20 mLs (50 mg total) by mouth 2 (two) times daily. 100 mL 11  . omeprazole (PRILOSEC) 20 MG capsule Take 20 mg by mouth daily.    . polyethylene glycol (MIRALAX / GLYCOLAX) packet Take 17 g by mouth 2 (two) times daily. 14 each 0  . potassium chloride SA (K-DUR,KLOR-CON) 20 MEQ tablet Take 20 mEq by mouth daily.    . pregabalin (LYRICA) 50 MG capsule Take 50 mg by mouth 3 (three) times daily.     No current facility-administered medications for this visit.     ROS: See HPI for pertinent positives and negatives.   Physical Examination  Vitals:   07/05/16 0854  BP: (!) 149/67  Temp: 97.6 F (36.4 C)  SpO2: 98%  Weight: 210 lb (95.3 kg)  Height: 5\' 5"  (1.651 m)   Body mass index is 34.95 kg/m.  General: A&O x 3, WDWN, obese female. Gait: seated in w/c Eyes: PERRLA. Pulmonary: Respirations are non labored, CTAB, good air movement Cardiac: regular Rhythm      Aorta is not palpable. Radial pulses: 2+ palpable                           VASCULAR EXAM: Extremities with ischemic changes: ulcer at dorsal aspect, base of right great toe, 2-3 mm depth, 2 x 3 cm, iodoform gauze removed, foul smelling, necrotic base, surrounding right foot tissue with dependent edema. Right tibial area ulcer measures 11 cm x 2 cmm, (wound vac dressing removed), foul smelling, necrotic base, erythema at distal medial aspect of wound.  LE Pulses Right Left       FEMORAL  not palpable seated in w/c, obese  not palpable seated in w/c, obese        POPLITEAL  not palpable   not palpable       POSTERIOR TIBIAL  non-Dopplerable     non-Dopplerable        DORSALIS PEDIS      ANTERIOR TIBIAL monophasic by Doppler  biphasic by Doppler         PERONEAL non-Dopplerable    non-Dopplerable     Abdomen: soft, NT, no palpable masses. Skin: no rashes, see Extremities  Musculoskeletal: weakness right upper and lower extremities.  Neurologic: A&O X 3; Appropriate Affect ; SENSATION: normal; MOTOR FUNCTION:  moving all extremities equally, motor strength 2/5 right upper and lower extremities, 4/5 left upper and lower extremities. Speech is fluent/normal. CN 2-12 grossly intact.    Non-Invasive Vascular Imaging: DATE: 07/05/2016 None  ASSESSMENT: KILIAN BIESCHKE is a 75 y.o. female who presents s/p Right below-the-knee popliteal artery to peroneal artery bypass with reversed greater saphenous vein, Endarterectomy of mid-segment peroneal artery(Date: 02/28/16).  Pt reports 1 week hx of left side chest intermittent pain, "feels heavy". Dr. Bridgett Larsson spoke with pt and husband and evaluated pt.  See Plan.   PLAN:  Send pt to [facility] ED for evaluation of atypical chest pain, also evaluate right foot and lower leg for possible osteomyelitis and cellulitis. Pt to be transported by the transport bus she arrived on.   The patient was given information about PAD including signs, symptoms, treatment, what symptoms should prompt the patient to seek immediate medical care, and risk reduction measures to take.  Clemon Chambers, RN, MSN, FNP-C [facility] [employer] Phone: [phone]  Clinic MD: Bridgett Larsson  07/05/16 9:12 AM

## 2016-07-05 NOTE — ED Notes (Signed)
Patient returned to room placed back on the monitor by Tiffany EMT . RN advised patient of plan, patient agreeable.

## 2016-07-05 NOTE — ED Notes (Signed)
Patient still off unit for scan.

## 2016-07-05 NOTE — ED Notes (Signed)
Returned from xray

## 2016-07-05 NOTE — ED Provider Notes (Signed)
[facility] DEPT Provider Note   CSN: [id number] Arrival date & time: 07/05/16  1027     History   Chief Complaint Chief Complaint  Patient presents with  . Chest Pain    HPI [name] is a 75 y.o. female.  HPI Patient was sent in from vascular surgery for further evaluation. She has had chest pain. Feels like a fluttering in her chest and also has tightness. At times goes down to her left arm. Has shortness of breath. Also sent in for possible infection on her right foot and lower leg has chronic wounds here. States the wound on her foot has gotten more painful and with more drainage. States the wound is normally closed but also states it is opened up. Patient's family member states that wound is always open. States she's had some chills but no fevers. She is overall very weak. States she has pressure on her chest but also states his pressure on her whole left side.   Past Medical History:  Diagnosis Date  . Aortic stenosis   . Complication of anesthesia    Hard to wake patient up after having anesthesia  . Diabetes mellitus without complication ([place])   . Diabetic neuropathy (HCC)    Takes Lyrica  . Edema of both legs    Takes Lasix  . GERD (gastroesophageal reflux disease)   . High cholesterol   . HTN (hypertension)   . Hypertension   . Shortness of breath dyspnea   . Spasm of back muscles   . Wound, open    Right great toe    Patient Active Problem List   Diagnosis Date Noted  . Pleural effusion, bilateral   . Pulmonary congestion   . Wheeze   . Dyspnea   . CHF (congestive heart failure) ([place])   . Right sided weakness   . Acute respiratory acidosis   . Surgery, elective   . Anemia, iron deficiency   . Leukocytosis   . Cerebrovascular accident (CVA) due to thrombosis of precerebral artery ([place]) 03/01/2016  . 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 Acute respiratory failure ([place])   . PAD (peripheral artery  disease) ([employer]) 02/28/2016  . Atherosclerosis of native arteries of the extremities with ulceration ([place]) 12/08/2015  . Open toe wound 05/04/2014  . Anemia, chronic disease 04/27/2014  . Occult blood positive stool 04/17/2014  . Edema 04/03/2014  . Unspecified hereditary and idiopathic peripheral neuropathy 03/14/2014  . Acute and chronic respiratory failure 02/14/2014  . Type II or unspecified type diabetes mellitus with neurological manifestations, uncontrolled(250.62) 02/14/2014  . Respiratory failure ([place]) 02/12/2014  . Altered mental status 02/12/2014  . Acute encephalopathy 02/12/2014  . Acute respiratory failure with hypercapnia ([place]) 02/12/2014  . Acute diastolic CHF (congestive heart failure) ([place]) 02/12/2014  . Aortic stenosis 02/04/2014  . Closed left subtrochanteric femur fracture ([place]) 02/03/2014  . Hip fracture, left ([place]) 02/03/2014  . Hypokalemia 02/03/2014  . Fibula fracture 02/03/2014  . MTP instability 02/03/2014  . Hip fracture ([place]) 02/03/2014  . HTN (hypertension)   . Type 2 diabetes mellitus without complication, without long-term current use of insulin ([place])   . HLD (hyperlipidemia)     Past Surgical History:  Procedure Laterality Date  . BYPASS GRAFT POPLITEAL TO TIBIAL Right 02/28/2016   Procedure: BYPASS GRAFT RIGHT BELOW KNEE POPLITEAL TO PERONEAL USING REVERSED RIGHT GREATER SAPHENOUS VEIN;  Surgeon: Conrad Goshen, MD;  Location: [facility];  Service: Vascular;  Laterality: Right;  . CYST EXCISION     Abdomen  . EYE SURGERY Bilateral    Cataract removal  . INTRAMEDULLARY (IM) NAIL INTERTROCHANTERIC Left 02/04/2014   Procedure: INTRAMEDULLARY (IM) NAIL INTERTROCHANTRIC FEMORAL;  Surgeon: Mauri Pole, MD;  Location: [facility];  Service: Orthopedics;  Laterality: Left;  . IR GENERIC HISTORICAL  03/01/2016   IR ANGIO INTRA EXTRACRAN SEL COM CAROTID INNOMINATE UNI R MOD SED 03/01/2016 [name], MD [facility]  . IR GENERIC HISTORICAL  03/01/2016   IR  ENDOVASC INTRACRANIAL INF OTHER THAN THROMBO ART INC DIAG ANGIO 03/01/2016 [name], MD [facility]  . IR GENERIC HISTORICAL  03/01/2016   IR INTRAVSC STENT CERV CAROTID W/O EMB-PROT MOD SED INC ANGIO 03/01/2016 [name], MD [facility]  . IR GENERIC HISTORICAL  06/03/2016   IR RADIOLOGIST EVAL & MGMT 06/03/2016 [facility]  . ORIF TOE FRACTURE Right 02/08/2014   Procedure: OPEN REDUCTION INTERNAL FIXATION Right METATARSAL  FRACTURE ;  Surgeon: Wylene Simmer, MD;  Location: [facility];  Service: Orthopedics;  Laterality: Right;  . PERIPHERAL VASCULAR CATHETERIZATION N/A 12/28/2015   Procedure: Abdominal Aortogram w/Lower Extremity;  Surgeon: Conrad Windham, MD;  Location: [facility];  Service: Cardiovascular;  Laterality: N/A;  . RADIOLOGY WITH ANESTHESIA N/A 03/01/2016   Procedure: RADIOLOGY WITH ANESTHESIA;  Surgeon: [name], MD;  Location: [facility];  Service: Radiology;  Laterality: N/A;  . VEIN HARVEST Right 02/28/2016   Procedure: RIGHT GREATER SAPHENOUS VEIN HARVEST;  Surgeon: Conrad Le Grand, MD;  Location: [facility];  Service: Vascular;  Laterality: Right;    OB History    Gravida Para Term Preterm AB Living   6 2 2   4 2    SAB TAB Ectopic Multiple Live Births   4               Home Medications    Prior to Admission medications   Medication Sig Start Date End Date Taking? Authorizing Provider  acetaminophen (TYLENOL) 500 MG tablet Take 500 mg by mouth every 6 (six) hours as needed for mild pain.    Historical Provider, MD  albuterol (PROVENTIL HFA;VENTOLIN HFA) 108 (90 Base) MCG/ACT inhaler Inhale 2 puffs into the lungs every 6 (six) hours as needed.    Historical Provider, MD  amLODipine (NORVASC) 10 MG tablet Take 1 tablet (10 mg total) by mouth daily. 03/13/16   Alvia Grove, PA-C  aspirin 325 MG tablet Take 1 tablet (325 mg total) by mouth daily with breakfast. 03/13/16   Alvia Grove, PA-C  atorvastatin (LIPITOR) 20 MG tablet Take 1 tablet (20 mg total)  by mouth daily. 03/13/16   Alvia Grove, PA-C  B-D INS SYRINGE 0.5CC/31GX5/16 31G X 5/16" 0.5 ML MISC  02/05/16   Historical Provider, MD  cetirizine (ZYRTEC) 10 MG tablet Take 10 mg by mouth daily as needed for allergies.    Historical Provider, MD  ciprofloxacin (CIPRO) 500 MG tablet Take 1 tablet (500 mg total) by mouth 2 (two) times daily. Patient not taking: Reported on 05/29/2016 03/13/16   Alvia Grove, PA-C  clopidogrel (PLAVIX) 75 MG tablet Take 1 tablet (75 mg total) by mouth daily with breakfast. 03/13/16   Alvia Grove, PA-C  collagenase (SANTYL) ointment Apply topically 2 (two) times daily. Apply to dorsum right foot wound daily. 03/13/16   Alvia Grove, PA-C  docusate sodium (COLACE) 100 MG capsule Take 100 mg by mouth 2 (two) times daily.  Historical Provider, MD  doxycycline (VIBRAMYCIN) 100 MG capsule Take 1 capsule (100 mg total) by mouth 2 (two) times daily. 07/01/16   Orbie Pyo, MD  fluticasone (FLONASE) 50 MCG/ACT nasal spray Place 1 spray into both nostrils daily.    Historical Provider, MD  furosemide (LASIX) 40 MG tablet Take 80 mg by mouth 2 (two) times daily.  01/08/16   Historical Provider, MD  insulin aspart (NOVOLOG) 100 UNIT/ML injection Inject 0-15 Units into the skin 3 (three) times daily with meals. Before each meal 3 times a day, 140-199 - 3 units, 200-250 - 5 units, 251-299 - 8 units,  300-349 - 12 units,  350 or above 15 units. Patient taking differently: Inject 4 Units into the skin 2 (two) times daily.  02/09/14   Thurnell Lose, MD  insulin glargine (LANTUS) 100 UNIT/ML injection Inject 0.15 mLs (15 Units total) into the skin 2 (two) times daily. 03/13/16   Alvia Grove, PA-C  losartan (COZAAR) 100 MG tablet Take 1 tablet (100 mg total) by mouth daily. 02/13/14   Samuella Cota, MD  metoprolol tartrate (LOPRESSOR) 25 mg/10 mL SUSP Take 20 mLs (50 mg total) by mouth 2 (two) times daily. 03/13/16   Alvia Grove, PA-C  omeprazole  (PRILOSEC) 20 MG capsule Take 20 mg by mouth daily.    Historical Provider, MD  polyethylene glycol (MIRALAX / GLYCOLAX) packet Take 17 g by mouth 2 (two) times daily. 02/09/14   Thurnell Lose, MD  potassium chloride SA (K-DUR,KLOR-CON) 20 MEQ tablet Take 20 mEq by mouth daily.    Historical Provider, MD  pregabalin (LYRICA) 50 MG capsule Take 50 mg by mouth 3 (three) times daily.    Historical Provider, MD    Family History Family History  Problem Relation Age of Onset  . Diabetes Other     Social History Social History  Substance Use Topics  . Smoking status: Never Smoker  . Smokeless tobacco: Never Used     Comment: Never smoked  . Alcohol use No     Allergies   Iodine; Penicillins; Shellfish allergy; Shellfish-derived products; Sulfa antibiotics; Sulfacetamide sodium; Sulfasalazine; Atorvastatin; and Eggs or egg-derived products   Review of Systems Review of Systems  Constitutional: Positive for chills. Negative for appetite change.  HENT: Negative for congestion.   Respiratory: Positive for shortness of breath.   Cardiovascular: Positive for chest pain and leg swelling.  Gastrointestinal: Negative for abdominal pain.  Genitourinary: Negative for dysuria.  Musculoskeletal: Negative for back pain.  Skin: Positive for wound.  Neurological: Negative for headaches.  Hematological: Negative for adenopathy.  Psychiatric/Behavioral: Negative for confusion.     Physical Exam Updated Vital Signs BP (!) 195/54   Pulse 81   Temp 97.8 F (36.6 C) (Oral)   Resp 17   Ht 5\' 4"  (1.626 m)   Wt 210 lb (95.3 kg)   SpO2 97%   BMI 36.05 kg/m   Physical Exam  Constitutional: She appears well-developed.  HENT:  Head: Atraumatic.  Eyes: EOM are normal.  Neck: Neck supple.  Cardiovascular: Normal rate.   Pulmonary/Chest: She exhibits no tenderness.  Abdominal: There is no tenderness.  Musculoskeletal: She exhibits tenderness.  Right lower extremity was opening over first  metatarsal joint. Has some slight purulent drainage. Some darkening of the toe. There is chronic wound on the lower leg anteriorly towards the medial aspect of the tibia. It is approximately 30 cm long. No drainage. There is some surrounding erythema.  Neurological: She is alert.  Skin: Skin is warm.  Psychiatric: She has a normal mood and affect.     ED Treatments / Results  Labs (all labs ordered are listed, but only abnormal results are displayed) Labs Reviewed  BASIC METABOLIC PANEL - Abnormal; Notable for the following:       Result Value   Glucose, Bld 206 (*)    All other components within normal limits  CBC - Abnormal; Notable for the following:    RDW 18.4 (*)    All other components within normal limits  C-REACTIVE PROTEIN  SEDIMENTATION RATE  I-STAT TROPOININ, ED    EKG  EKG Interpretation  Date/Time:  Friday July 05 2016 10:31:43 EST Ventricular Rate:  89 PR Interval:  162 QRS Duration: 72 QT Interval:  346 QTC Calculation: 420 R Axis:   61 Text Interpretation:  Normal sinus rhythm T wave abnormality, consider inferior ischemia Abnormal ECG Confirmed by Alvino Chapel  MD, [facility] [phone]) on 07/05/2016 11:12:35 AM       Radiology Dg Chest 2 View  Result Date: 07/05/2016 CLINICAL DATA:  Lower leg infection EXAM: CHEST  2 VIEW COMPARISON:  03/12/2016 FINDINGS: Cardiac shadow remains enlarged. Patchy infiltrative changes are again seen but mildly improved when compared with the prior exam. No sizable effusion is seen. No bony abnormality is noted. IMPRESSION: Patchy changes in the lungs bilaterally but improved from the prior exam. Electronically Signed   By: Inez Catalina M.D.   On: 07/05/2016 12:32   Dg Tibia/fibula Right  Result Date: 07/05/2016 CLINICAL DATA:  Right lower extremity infection EXAM: RIGHT TIBIA AND FIBULA - 2 VIEW COMPARISON:  None. FINDINGS: The tibia and fibula are within normal limits. The previously seen fracture is no longer identified in the  distal fibula. Vascular calcifications are seen. Considerable soft tissue changes are noted consistent with the underlying cellulitis. No findings to suggest osteomyelitis are seen. IMPRESSION: Soft tissue changes without acute bony abnormality. Electronically Signed   By: Inez Catalina M.D.   On: 07/05/2016 12:35   Dg Foot Complete Right  Result Date: 07/05/2016 CLINICAL DATA:  Right first toe infection EXAM: RIGHT FOOT COMPLETE - 3+ VIEW COMPARISON:  02/05/2014 FINDINGS: Generalized osteopenia is noted. There are degenerative changes of the first MTP joint with some suggestion of fusion. No area of bony erosion to suggest osteomyelitis is noted at this time. Considerable soft tissue swelling is noted consistent with the underlying history of infection. Vascular calcifications are seen. IMPRESSION: No definitive osteomyelitis is noted. There are changes at the first MTP joint suggestive of prior fusion. Soft tissue changes consistent with localized infection. Electronically Signed   By: Inez Catalina M.D.   On: 07/05/2016 12:33    Procedures Procedures (including critical care time)  Medications Ordered in ED Medications  acetaminophen (TYLENOL) tablet 650 mg (650 mg Oral Given 07/05/16 1452)     Initial Impression / Assessment and Plan / ED Course  I have reviewed the triage vital signs and the nursing notes.  Pertinent labs & imaging results that were available during my care of the patient were reviewed by me and considered in my medical decision making (see chart for details).  Clinical Course     Patient with chest pain. EKG reassuring. Enzymes negative. Pain improved somewhat but still present. Also with foot wound. Has some slight drainage but overall does not appear that bad. CRP is normal. To admit to internal medicine for the chest pain. Final Clinical Impressions(s) / ED  Diagnoses   Final diagnoses:  Chest pain, unspecified type  Ulcer of right lower extremity, unspecified ulcer  stage [facility])    New Prescriptions New Prescriptions   No medications on file     Davonna Belling, MD 07/05/16 1520

## 2016-07-05 NOTE — Patient Instructions (Signed)

## 2016-07-05 NOTE — H&P (Signed)
Date: 07/05/2016               Patient Name:  [name] MRN: [id number]  DOB: 1941-05-23 Age / Sex: 75 y.o., female   PCP: [name], MD         Medical Service: Internal Medicine Teaching Service         Attending Physician: Dr. Lucious Groves, DO    First Contact: Dr. Holley Raring Pager: [phone]  Second Contact: Dr. Berline Lopes Pager: [phone]       After Hours (After 5p/  First Contact Pager: [phone]  weekends / holidays): Second Contact Pager: [phone]   Chief Complaint: CP and leg wound  History of Present Illness: [name] is a 75 y.o. 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  Patient reports that she has had pressure like chest pain in the left lower chest, it occasionally radiates to the left neck and arm with painful paraesthesias, however she also complains of painful paraesthesias in her right arm which she reports are persistent following her L ICA stroke several months ago. The chest pain does not appear exertional in nature as she describes episodic events occurring most frequently in the morning and evening.   She also complains of a right lower leg wound which has been chronic and demonstrates poor healing in the setting of her severe vascular disease. The wound has been persistent since her popliteal bypass surgery several months ago when an ulceration developed along the graft. She has been following regularly w/ vascular surgery who placed a wound vac on 04/12/16 which was removed today. In the ED, she was afebrile w/o leukocytosis, and the wound did not appear grossly infected. She had plain film imaging which was not concerning for osteomyelitis.  Meds: No current facility-administered medications for this encounter.    Current  Outpatient Prescriptions  Medication Sig Dispense Refill  . acetaminophen (TYLENOL) 500 MG tablet Take 500 mg by mouth every 6 (six) hours as needed for mild pain.    [name] albuterol (PROVENTIL HFA;VENTOLIN HFA) 108 (90 Base) MCG/ACT inhaler Inhale 2 puffs into the lungs every 6 (six) hours as needed.    [name] amLODipine (NORVASC) 10 MG tablet Take 1 tablet (10 mg total) by mouth daily. 30 tablet 3  . aspirin 81 MG chewable tablet Chew 81 mg by mouth daily.    [name] atorvastatin (LIPITOR) 20 MG tablet Take 1 tablet (20 mg total) by mouth daily. 30 tablet 11  . B-D INS SYRINGE 0.5CC/31GX5/16 31G X 5/16" 0.5 ML MISC     . cetirizine (ZYRTEC) 10 MG tablet Take 10 mg by mouth daily.     . cholecalciferol (VITAMIN D) 1000 units tablet Take 1,000 Units by mouth daily.    . citalopram (CELEXA) 20 MG tablet Take 20 mg by mouth daily.    . clopidogrel (PLAVIX) 75 MG tablet Take 1 tablet (75 mg total) by mouth daily with breakfast. 30 tablet 3  . collagenase (SANTYL) ointment Apply topically 2 (two) times daily. Apply to dorsum right foot wound daily. 15 g 0  . docusate sodium (COLACE) 100 MG capsule Take 100 mg by mouth 2 (two) times daily.    [name] doxycycline (VIBRAMYCIN) 100 MG capsule Take 1 capsule (100 mg total) by  mouth 2 (two) times daily. 20 capsule 0  . fluticasone (FLONASE) 50 MCG/ACT nasal spray Place 1 spray into both nostrils daily.    . furosemide (LASIX) 40 MG tablet Take 80 mg by mouth 2 (two) times daily.     . insulin aspart (NOVOLOG) 100 UNIT/ML injection Inject 0-15 Units into the skin 3 (three) times daily with meals. Before each meal 3 times a day, 140-199 - 3 units, 200-250 - 5 units, 251-299 - 8 units,  300-349 - 12 units,  350 or above 15 units. (Patient taking differently: Inject 12 Units into the skin 3 (three) times daily with meals. ) 10 mL 11  . insulin glargine (LANTUS) 100 UNIT/ML injection Inject 0.15 mLs (15 Units total) into the skin 2 (two) times daily. (Patient taking differently:  Inject 30 Units into the skin at bedtime. ) 10 mL 11  . losartan (COZAAR) 100 MG tablet Take 1 tablet (100 mg total) by mouth daily.    [name] omeprazole (PRILOSEC) 20 MG capsule Take 20 mg by mouth daily.    . polyethylene glycol (MIRALAX / GLYCOLAX) packet Take 17 g by mouth 2 (two) times daily. (Patient taking differently: Take 17 g by mouth daily as needed for mild constipation. ) 14 each 0  . potassium chloride SA (K-DUR,KLOR-CON) 20 MEQ tablet Take 20 mEq by mouth daily.    . pregabalin (LYRICA) 50 MG capsule Take 50 mg by mouth 3 (three) times daily.      (Not in a hospital admission) Allergies: Allergies as of 07/05/2016 - Review Complete 07/05/2016  Allergen Reaction Noted  . Iodine Anaphylaxis 02/03/2014  . Penicillins Anaphylaxis 02/03/2014  . Shellfish allergy Anaphylaxis 02/03/2014  . Shellfish-derived products Anaphylaxis 07/26/2014  . Sulfa antibiotics Anaphylaxis 02/03/2014  . Sulfacetamide sodium Anaphylaxis 07/26/2014  . Sulfasalazine Anaphylaxis 02/03/2014  . Atorvastatin  07/26/2014  . Eggs or egg-derived products  07/26/2014   Past Medical History:  Diagnosis Date  . Aortic stenosis   . Complication of anesthesia    Hard to wake patient up after having anesthesia  . Diabetes mellitus without complication ([place])   . Diabetic neuropathy (HCC)    Takes Lyrica  . Edema of both legs    Takes Lasix  . GERD (gastroesophageal reflux disease)   . High cholesterol   . HTN (hypertension)   . Hypertension   . Shortness of breath dyspnea   . Spasm of back muscles   . Wound, open    Right great toe    Family History: Family History  Problem Relation Age of Onset  . Diabetes Other    Social History: Pt lives with her husband and has been in a SNF following prolonged recovery from popliteal bypass surgery, recently discharge to home 1 wk ago. Denies alcohol, tobacco, and drug use.  Review of Systems: A complete ROS was negative except as per HPI. Review of Systems    Constitutional: Negative for chills, diaphoresis, fever and weight loss.  Eyes: Negative for blurred vision.  Respiratory: Negative for cough and shortness of breath.   Cardiovascular: Positive for chest pain, palpitations, orthopnea, claudication and leg swelling.  Gastrointestinal: Negative for abdominal pain, constipation, diarrhea, nausea and vomiting.  Genitourinary: Negative for dysuria, frequency and urgency.  Musculoskeletal: Negative for myalgias.  Skin: Negative for rash.  Neurological: Positive for tingling, sensory change, focal weakness and weakness. Negative for dizziness, tremors, seizures and headaches.  Endo/Heme/Allergies: Negative for polydipsia.  Psychiatric/Behavioral: The patient is not nervous/anxious.  Physical Exam: Vitals:   07/05/16 1403 07/05/16 1415 07/05/16 1445 07/05/16 1545  BP: (!) 172/44 176/63 (!) 195/54 178/65  Pulse: 83 80 81 79  Resp: 20 14 17 15   Temp:      TempSrc:      SpO2: 97% 97% 97% 100%  Weight:      Height:       Physical Exam  Constitutional: She is oriented to person, place, and time. She appears well-developed. She is cooperative. No distress.  Obese, tired appearing woman.  HENT:  Head: Normocephalic and atraumatic.  Right Ear: Hearing normal.  Left Ear: Hearing normal.  Nose: Nose normal.  Mouth/Throat: Mucous membranes are normal.  Eyes: EOM are normal. Pupils are equal, round, and reactive to light.  Neck: JVD (8 cm) present.  Cardiovascular: Normal rate, regular rhythm, S1 normal, S2 normal and intact distal pulses.  Exam reveals no gallop.   Murmur heard.  Systolic murmur is present with a grade of 3/6  Pulmonary/Chest: Effort normal and breath sounds normal. No respiratory distress. She has no wheezes. She has no rhonchi. She has no rales. She exhibits no tenderness. Breasts are symmetrical.  Abdominal: Soft. Normal appearance and bowel sounds are normal. She exhibits no ascites. There is no hepatosplenomegaly.  There is no tenderness. There is no CVA tenderness.  Musculoskeletal: Normal range of motion. She exhibits edema (3+ LLE, 2+ RLE to above the knee) and tenderness (diffusely BLE).       Legs:      Feet:  Neurological: She is alert and oriented to person, place, and time. She has normal strength. She displays atrophy (R arm w/ decreased grip strength, R leg weakness). She displays no tremor. A cranial nerve deficit (Mild L facial droop at rest which corrects with intentional movement, L parasthesias to fine touch) and sensory deficit (Parasthesias of digits 1-3 of the L hand) is present. She exhibits normal muscle tone.  Skin: Skin is warm, dry and intact. She is not diaphoretic.  Psychiatric: She has a normal mood and affect. Her speech is normal and behavior is normal.    Labs: CBC:  Recent Labs Lab 07/01/16 1704 07/05/16 1129  WBC 10.0 10.0  NEUTROABS 5.6  --   HGB 12.4 12.0  HCT 38.6 38.0  MCV 83.6 84.1  PLT 273 99991111   Basic Metabolic Panel:  Recent Labs Lab 07/01/16 1704 07/05/16 1129  NA 139 140  K 4.2 4.5  CL 102 106  CO2 27 24  GLUCOSE 99 206*  BUN 22* 18  CREATININE 0.81 0.74  CALCIUM 9.4 9.6   Cardiac Enzymes:  Recent Labs Lab 07/05/16 1155  TROPIPOC 0.07   BNP (last 3 results)  Recent Labs  02/29/16 0800 03/03/16 0849  BNP 133.3* 225.6*   Liver Function Tests:  Recent Labs Lab 07/01/16 1704  AST 29  ALT 29  ALKPHOS 62  BILITOT 0.4  PROT 7.0  ALBUMIN 3.7   Inflammatory Markers: Recent Labs     07/05/16  1259  07/05/16  1358  CRP  <0.8   --   ESRSEDRATE   --   15   CBG: Lab Results  Component Value Date   HGBA1C 7.7 (H) 03/02/2016   EKG: EKG Interpretation  Date/Time:  Friday July 05 2016 10:31:43 EST Ventricular Rate:  89 PR Interval:  162 QRS Duration: 72 QT Interval:  346 QTC Calculation: 420 R Axis:   61 Text Interpretation:  Normal sinus rhythm T wave abnormality, consider inferior  ischemia Abnormal ECG Confirmed  by Alvino Chapel  MD, NATHAN [phone]) on 07/05/2016 11:12:35 AM  Imaging: Dg Chest 2 View  Result Date: 07/05/2016 CLINICAL DATA:  Lower leg infection EXAM: CHEST  2 VIEW COMPARISON:  03/12/2016 FINDINGS: Cardiac shadow remains enlarged. Patchy infiltrative changes are again seen but mildly improved when compared with the prior exam. No sizable effusion is seen. No bony abnormality is noted. IMPRESSION: Patchy changes in the lungs bilaterally but improved from the prior exam. Electronically Signed   By: Inez Catalina M.D.   On: 07/05/2016 12:32   Dg Tibia/fibula Right  Result Date: 07/05/2016 CLINICAL DATA:  Right lower extremity infection EXAM: RIGHT TIBIA AND FIBULA - 2 VIEW COMPARISON:  None. FINDINGS: The tibia and fibula are within normal limits. The previously seen fracture is no longer identified in the distal fibula. Vascular calcifications are seen. Considerable soft tissue changes are noted consistent with the underlying cellulitis. No findings to suggest osteomyelitis are seen. IMPRESSION: Soft tissue changes without acute bony abnormality. Electronically Signed   By: Inez Catalina M.D.   On: 07/05/2016 12:35   Dg Foot Complete Right  Result Date: 07/05/2016 CLINICAL DATA:  Right first toe infection EXAM: RIGHT FOOT COMPLETE - 3+ VIEW COMPARISON:  02/05/2014 FINDINGS: Generalized osteopenia is noted. There are degenerative changes of the first MTP joint with some suggestion of fusion. No area of bony erosion to suggest osteomyelitis is noted at this time. Considerable soft tissue swelling is noted consistent with the underlying history of infection. Vascular calcifications are seen. IMPRESSION: No definitive osteomyelitis is noted. There are changes at the first MTP joint suggestive of prior fusion. Soft tissue changes consistent with localized infection. Electronically Signed   By: Inez Catalina M.D.   On: 07/05/2016 12:33   Assessment & Plan by Problem: Active Problems:   Chest pain  Ms.  ARYHANNA SIORDIA is a 75 y.o. 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  1) CP: Pt w/ unclear h/o including some typical and atypical features of ischemic CP. Pressure like sensation, substernal, radiating to right arm and neck. However, apparently not exertional, also with abdominal fullness and right arm pain, concurrent complaint of GERD. Patient is at very high risk for ischemic disease given HFpEF, HTN, obesity, DM and known PAD. POC Trop negative, EKG w/o evidence of ischemia. Low risk for PE w/ Well's of 1.5. Not reproducible on exam, so MSK less likely. - Admit to tele - EKG in AM - Trend trops x3 - continue home meds: ASA, plavix, statin  2) Non-healing leg wounds: Pt w/ ulceration following PAD bypass graft. Very little signs of wound healing. No current indications of infection w/o fever, WBC, discharge, or erythema and plain films reassuring for concern of osteo. S/p wound vac removal today. VVS not convinced perfusion is adequate to heal wounds and is concerned for risk of infection. Plan to refer to Dr. Sharol Given. Started on Doxycycline PO today by VVS, we will not continue. - WOC c/s - trend CBC, follow fever curve  3) L ICA occusion: s/p B ICA stenting by IR. Persistent deficits of R weakness, difficutly w/ walking, and L facial paraesthesias. Now w/ new bilateral arm parasthesias R>L presenting over the last several days. Pt denies any other new focal changes. Would like f/u scheduled w/ Dr. Erlinda Hong for  continued evaluation following her CVA, but she has not been able to accomplish this. - recheck Neuro exam in AM  4) HFpEF: Echo in July w/ EF of 55-60% and grade 2 diastolic dysfunction w/ significant LVH. Pt also w/ mild aortic stenosis, no wall motion abnormalities to suggest ischemic cardiomyopathy.  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 LE edema is reportedly worse in absence of home meds and likely worsened by HTN/tachycardia. - 80mg  IV Lasix - restart home meds: Lasix 80mg  PO BID  5) HTN: Elevated BPs to [id number] systolic. Likely exacerbated by missed antihypertensives today. Restart home meds: amlodipine 10mg  and losartan 100mg . IV Hydral 5mg  PRN BP >180/100.  6) DM: Last A1c 7.7. Home meds: Lantus 15U qHS, Aspart 12U qAC. Continue home meds + SSI.  7) Paraesthesias: Pt presents with multiple complaints of peripheral neuropathy which may be related to longstanding diabetes. Stocking-glove distribution, but also complains of some more radicular symptoms w/ radiation from neck to UE. Will continue home meds: pregabalin 50mg  TID, citalopram 20mg  qD.  DVT PPx - low molecular weight heparin  Code Status - Full  Consults Placed - None  Dispo: Admit patient to Inpatient with expected length of stay greater than 2 midnights.  Signed: Holley Raring, MD 07/05/2016, 4:39 PM  Pager: [phone]

## 2016-07-06 DIAGNOSIS — E11622 Type 2 diabetes mellitus with other skin ulcer: Secondary | ICD-10-CM | POA: Diagnosis not present

## 2016-07-06 DIAGNOSIS — Z95828 Presence of other vascular implants and grafts: Secondary | ICD-10-CM

## 2016-07-06 DIAGNOSIS — Z833 Family history of diabetes mellitus: Secondary | ICD-10-CM

## 2016-07-06 DIAGNOSIS — R0789 Other chest pain: Secondary | ICD-10-CM | POA: Diagnosis not present

## 2016-07-06 DIAGNOSIS — E1140 Type 2 diabetes mellitus with diabetic neuropathy, unspecified: Secondary | ICD-10-CM | POA: Diagnosis not present

## 2016-07-06 DIAGNOSIS — I350 Nonrheumatic aortic (valve) stenosis: Secondary | ICD-10-CM | POA: Diagnosis present

## 2016-07-06 DIAGNOSIS — L97919 Non-pressure chronic ulcer of unspecified part of right lower leg with unspecified severity: Secondary | ICD-10-CM

## 2016-07-06 DIAGNOSIS — Z794 Long term (current) use of insulin: Secondary | ICD-10-CM

## 2016-07-06 DIAGNOSIS — E1165 Type 2 diabetes mellitus with hyperglycemia: Secondary | ICD-10-CM | POA: Diagnosis present

## 2016-07-06 DIAGNOSIS — E1152 Type 2 diabetes mellitus with diabetic peripheral angiopathy with gangrene: Secondary | ICD-10-CM | POA: Diagnosis not present

## 2016-07-06 DIAGNOSIS — I6523 Occlusion and stenosis of bilateral carotid arteries: Secondary | ICD-10-CM | POA: Diagnosis not present

## 2016-07-06 DIAGNOSIS — S92911A Unspecified fracture of right toe(s), initial encounter for closed fracture: Secondary | ICD-10-CM | POA: Diagnosis present

## 2016-07-06 DIAGNOSIS — E785 Hyperlipidemia, unspecified: Secondary | ICD-10-CM | POA: Diagnosis present

## 2016-07-06 DIAGNOSIS — I872 Venous insufficiency (chronic) (peripheral): Secondary | ICD-10-CM | POA: Diagnosis present

## 2016-07-06 DIAGNOSIS — I10 Essential (primary) hypertension: Secondary | ICD-10-CM

## 2016-07-06 DIAGNOSIS — Z91013 Allergy to seafood: Secondary | ICD-10-CM

## 2016-07-06 DIAGNOSIS — Z79899 Other long term (current) drug therapy: Secondary | ICD-10-CM

## 2016-07-06 DIAGNOSIS — I248 Other forms of acute ischemic heart disease: Secondary | ICD-10-CM | POA: Diagnosis not present

## 2016-07-06 DIAGNOSIS — I2510 Atherosclerotic heart disease of native coronary artery without angina pectoris: Secondary | ICD-10-CM | POA: Diagnosis present

## 2016-07-06 DIAGNOSIS — D509 Iron deficiency anemia, unspecified: Secondary | ICD-10-CM | POA: Diagnosis present

## 2016-07-06 DIAGNOSIS — Z882 Allergy status to sulfonamides status: Secondary | ICD-10-CM

## 2016-07-06 DIAGNOSIS — Z7401 Bed confinement status: Secondary | ICD-10-CM | POA: Diagnosis not present

## 2016-07-06 DIAGNOSIS — E669 Obesity, unspecified: Secondary | ICD-10-CM

## 2016-07-06 DIAGNOSIS — R778 Other specified abnormalities of plasma proteins: Secondary | ICD-10-CM

## 2016-07-06 DIAGNOSIS — I43 Cardiomyopathy in diseases classified elsewhere: Secondary | ICD-10-CM | POA: Diagnosis not present

## 2016-07-06 DIAGNOSIS — I5033 Acute on chronic diastolic (congestive) heart failure: Secondary | ICD-10-CM | POA: Diagnosis not present

## 2016-07-06 DIAGNOSIS — Z888 Allergy status to other drugs, medicaments and biological substances status: Secondary | ICD-10-CM

## 2016-07-06 DIAGNOSIS — E7800 Pure hypercholesterolemia, unspecified: Secondary | ICD-10-CM | POA: Diagnosis not present

## 2016-07-06 DIAGNOSIS — L97818 Non-pressure chronic ulcer of other part of right lower leg with other specified severity: Secondary | ICD-10-CM

## 2016-07-06 DIAGNOSIS — Z7982 Long term (current) use of aspirin: Secondary | ICD-10-CM | POA: Diagnosis not present

## 2016-07-06 DIAGNOSIS — Z7902 Long term (current) use of antithrombotics/antiplatelets: Secondary | ICD-10-CM

## 2016-07-06 DIAGNOSIS — R000 Tachycardia, unspecified: Secondary | ICD-10-CM | POA: Diagnosis present

## 2016-07-06 DIAGNOSIS — Z9889 Other specified postprocedural states: Secondary | ICD-10-CM

## 2016-07-06 DIAGNOSIS — I6522 Occlusion and stenosis of left carotid artery: Secondary | ICD-10-CM

## 2016-07-06 DIAGNOSIS — I69320 Aphasia following cerebral infarction: Secondary | ICD-10-CM | POA: Diagnosis not present

## 2016-07-06 DIAGNOSIS — Z880 Allergy status to penicillin: Secondary | ICD-10-CM

## 2016-07-06 DIAGNOSIS — I69351 Hemiplegia and hemiparesis following cerebral infarction affecting right dominant side: Secondary | ICD-10-CM | POA: Diagnosis not present

## 2016-07-06 DIAGNOSIS — Z8673 Personal history of transient ischemic attack (TIA), and cerebral infarction without residual deficits: Secondary | ICD-10-CM

## 2016-07-06 DIAGNOSIS — E1151 Type 2 diabetes mellitus with diabetic peripheral angiopathy without gangrene: Secondary | ICD-10-CM

## 2016-07-06 DIAGNOSIS — R202 Paresthesia of skin: Secondary | ICD-10-CM

## 2016-07-06 DIAGNOSIS — K219 Gastro-esophageal reflux disease without esophagitis: Secondary | ICD-10-CM | POA: Diagnosis not present

## 2016-07-06 DIAGNOSIS — Z91012 Allergy to eggs: Secondary | ICD-10-CM

## 2016-07-06 DIAGNOSIS — I5031 Acute diastolic (congestive) heart failure: Secondary | ICD-10-CM | POA: Diagnosis not present

## 2016-07-06 DIAGNOSIS — I110 Hypertensive heart disease with heart failure: Principal | ICD-10-CM

## 2016-07-06 DIAGNOSIS — R079 Chest pain, unspecified: Secondary | ICD-10-CM | POA: Diagnosis present

## 2016-07-06 LAB — GLUCOSE, CAPILLARY
GLUCOSE-CAPILLARY: 188 mg/dL — AB (ref 65–99)
GLUCOSE-CAPILLARY: 202 mg/dL — AB (ref 65–99)
GLUCOSE-CAPILLARY: 143 mg/dL — AB (ref 65–99)
Glucose-Capillary: 133 mg/dL — ABNORMAL HIGH (ref 65–99)

## 2016-07-06 LAB — TROPONIN I
TROPONIN I: 0.09 ng/mL — AB (ref <0.03)
Troponin I: 0.04 ng/mL (ref <0.03)
Troponin I: 0.05 ng/mL (ref <0.03)
Troponin I: 0.04 ng/mL (ref <0.03)

## 2016-07-06 MED ORDER — RANOLAZINE ER 500 MG PO TB12
500.0000 mg | ORAL_TABLET | Freq: Two times a day (BID) | ORAL | Status: DC
  Administered 2016-07-06 – 2016-07-09 (×7): 500 mg via ORAL
  Filled 2016-07-06 (×7): qty 1

## 2016-07-06 MED ORDER — ATORVASTATIN CALCIUM 80 MG PO TABS
80.0000 mg | ORAL_TABLET | Freq: Every day | ORAL | Status: DC
  Administered 2016-07-07 – 2016-07-09 (×3): 80 mg via ORAL
  Filled 2016-07-06 (×3): qty 1

## 2016-07-06 MED ORDER — NITROGLYCERIN 0.4 MG SL SUBL: 0.4000 mg | SUBLINGUAL_TABLET | SUBLINGUAL | Status: DC | PRN

## 2016-07-06 MED ORDER — FUROSEMIDE 10 MG/ML IJ SOLN
80.0000 mg | Freq: Two times a day (BID) | INTRAMUSCULAR | Status: DC
  Administered 2016-07-06 – 2016-07-09 (×6): 80 mg via INTRAVENOUS
  Filled 2016-07-06 (×7): qty 8

## 2016-07-06 MED ORDER — ISOSORBIDE MONONITRATE ER 60 MG PO TB24
60.0000 mg | ORAL_TABLET | Freq: Every day | ORAL | Status: DC
  Administered 2016-07-06 – 2016-07-09 (×4): 60 mg via ORAL
  Filled 2016-07-06 (×4): qty 1

## 2016-07-06 MED ORDER — METOPROLOL TARTRATE 50 MG PO TABS
50.0000 mg | ORAL_TABLET | Freq: Two times a day (BID) | ORAL | Status: DC
  Administered 2016-07-06 – 2016-07-09 (×6): 50 mg via ORAL
  Filled 2016-07-06 (×6): qty 1

## 2016-07-06 NOTE — Progress Notes (Signed)
Subjective: Doing well today. No complaints of chest pain, but endorses some heaviness. Still complains of pain in her right foot. New complaint of sore throat.  Objective: Vital signs in last 24 hours: Temp:  [97.8 F (36.6 C)-98.3 F (36.8 C)] 98.3 F (36.8 C) (12/02 0447) Pulse Rate:  [77-90] 77 (12/02 0447) Resp:  [12-20] 20 (12/02 0447) BP: (122-195)/(44-93) 122/47 (12/02 0447) SpO2:  [95 %-100 %] 95 % (12/02 0447) Weight:  [95.3 kg (210 lb)] 95.3 kg (210 lb) (12/01 1045)  Weight change:   Intake/Output Summary (Last 24 hours) at 07/06/16 1028 Last data filed at 07/05/16 2300  Gross per 24 hour  Intake              200 ml  Output                0 ml  Net              200 ml   Physical Exam: General: well appearing, no acute distress, laying at about 45 degrees HEENT: moist mucous membranes, oropharynx clear CV: Regular rate and rhythm, 3/6 systolic murmur loudest at left upper sternal border Pulm: normal work of breathing, clear to auscultation bilaterally Neuro: right sided weakness, left facial numbness and weakness Ext: 2+ edema extending past knee, weak pulses Skin: Dressings in place on right medial calf wound and right 1st metatarsal, clean and dry. No purulent drainage. Right 1st metatarsal appears necrotic.  Lab Results: Results for orders placed or performed during the hospital encounter of 07/05/16 (from the past 48 hour(s))  Basic metabolic panel     Status: Abnormal   Collection Time: 07/05/16 11:29 AM  Result Value Ref Range   Sodium 140 135 - 145 mmol/L   Potassium 4.5 3.5 - 5.1 mmol/L   Chloride 106 101 - 111 mmol/L   CO2 24 22 - 32 mmol/L   Glucose, Bld 206 (H) 65 - 99 mg/dL   BUN 18 6 - 20 mg/dL   Creatinine, Ser 0.74 0.44 - 1.00 mg/dL   Calcium 9.6 8.9 - 10.3 mg/dL   GFR calc non Af Amer >60 >60 mL/min   GFR calc Af Amer >60 >60 mL/min    Comment: (NOTE) The eGFR has been calculated using the CKD EPI equation. This calculation has not been  validated in all clinical situations. eGFR's persistently <60 mL/min signify possible Chronic Kidney Disease.    Anion gap 10 5 - 15  CBC     Status: Abnormal   Collection Time: 07/05/16 11:29 AM  Result Value Ref Range   WBC 10.0 4.0 - 10.5 K/uL   RBC 4.52 3.87 - 5.11 MIL/uL   Hemoglobin 12.0 12.0 - 15.0 g/dL   HCT 38.0 36.0 - 46.0 %   MCV 84.1 78.0 - 100.0 fL   MCH 26.5 26.0 - 34.0 pg   MCHC 31.6 30.0 - 36.0 g/dL   RDW 18.4 (H) 11.5 - 15.5 %   Platelets 245 150 - 400 K/uL  I-stat troponin, ED     Status: None   Collection Time: 07/05/16 11:55 AM  Result Value Ref Range   Troponin i, poc 0.07 0.00 - 0.08 ng/mL   Comment 3            Comment: Due to the release kinetics of cTnI, a negative result within the first hours of the onset of symptoms does not rule out myocardial infarction with certainty. If myocardial infarction is still suspected, repeat the test at  appropriate intervals.   C-reactive protein     Status: None   Collection Time: 07/05/16 12:59 PM  Result Value Ref Range   CRP <0.8 <1.0 mg/dL  Sedimentation rate     Status: None   Collection Time: 07/05/16  1:58 PM  Result Value Ref Range   Sed Rate 15 0 - 22 mm/hr  TSH     Status: None   Collection Time: 07/05/16  4:40 PM  Result Value Ref Range   TSH 1.054 0.350 - 4.500 uIU/mL    Comment: Performed by a 3rd Generation assay with a functional sensitivity of <=0.01 uIU/mL.  Troponin I     Status: Abnormal   Collection Time: 07/05/16  6:27 PM  Result Value Ref Range   Troponin I 0.06 (HH) <0.03 ng/mL    Comment: CRITICAL RESULT CALLED TO, READ BACK BY AND VERIFIED WITH: DAVIDSON,K RN 07/05/2016 0808 JORDANS   Glucose, capillary     Status: Abnormal   Collection Time: 07/05/16 11:35 PM  Result Value Ref Range   Glucose-Capillary 220 (H) 65 - 99 mg/dL  Troponin I     Status: Abnormal   Collection Time: 07/05/16 11:45 PM  Result Value Ref Range   Troponin I 0.04 (HH) <0.03 ng/mL    Comment: CRITICAL  VALUE NOTED.  VALUE IS CONSISTENT WITH PREVIOUSLY REPORTED AND CALLED VALUE.  Troponin I     Status: Abnormal   Collection Time: 07/06/16  5:37 AM  Result Value Ref Range   Troponin I 0.09 (HH) <0.03 ng/mL    Comment: CRITICAL VALUE NOTED.  VALUE IS CONSISTENT WITH PREVIOUSLY REPORTED AND CALLED VALUE.  Glucose, capillary     Status: Abnormal   Collection Time: 07/06/16  6:42 AM  Result Value Ref Range   Glucose-Capillary 188 (H) 65 - 99 mg/dL    Studies/Results: - ECG today pending  Medications: I have reviewed the patient's current medications. Scheduled Meds: . amLODipine  10 mg Oral Daily  . aspirin  325 mg Oral Q breakfast  . atorvastatin  20 mg Oral Daily  . cholecalciferol  1,000 Units Oral Daily  . citalopram  20 mg Oral Daily  . clopidogrel  75 mg Oral Q breakfast  . docusate sodium  100 mg Oral BID  . enoxaparin (LOVENOX) injection  40 mg Subcutaneous Q24H  . fluticasone  1 spray Each Nare Daily  . insulin aspart  0-15 Units Subcutaneous TID WC  . insulin glargine  15 Units Subcutaneous BID  . loratadine  10 mg Oral Daily  . losartan  100 mg Oral Daily  . metoprolol tartrate  50 mg Oral BID  . pantoprazole  40 mg Oral Daily  . polyethylene glycol  17 g Oral BID  . potassium chloride SA  20 mEq Oral Daily  . pregabalin  50 mg Oral TID   Continuous Infusions: PRN Meds:.acetaminophen, albuterol   Assessment/Plan: Active Problems:   HTN (hypertension)   Type 2 diabetes mellitus without complication, without long-term current use of insulin (HCC)   Acute diastolic CHF (congestive heart failure) (HCC)   PAD (peripheral artery disease) ([place])   Chest pain  [name] is a 75 y.o. female with 1 week of atypical chest pain and worsening lower extremity edema most likely consistent with demand ischemia and acute HF in the setting of uncontrolled hypertension.  Chest pain:  CAD is the most likely the underlying etiology of the patient's chest pain given her multiple  risk factors and history. Mechanistically,  her chest pain is probably due to demand ischemia in the setting of increased afterload from uncontrolled hypertension. SPECT in 01/2016 showed possibility of minimal myocardial ischemia; however the quality of the study was poor.  - Tn 0.07 on admission --> 0.06 --> 0.04 --> 0.09 - Continue trending Tn - f/u ECG in the morning - Consult cardiology re: possible cath. - continue home medication: Aspirin, Plavix, statin  HFpEF: Echo (03/02/16): EF 55-60%, mild aortic stenosis, grade 2 diastolic dysfunction. Still fluid up on exam today. - continue IV lasix 35m BID  Hypertension, widened pulse pressure: In the ED, BP was noted to be 195/54. Today 122/47. Pulse pressure still noted to be widened. No evidence of aortic regurgitation on exam. May be related to age and patient's existing atherosclerotic disease. Per UpToDate, stiffening of the aorta with age reduces the elastic reservoir capacity, thus more blood runs off from each stroke volume during systole, resulting in a reduced blood volume within the aorta at the onset of diastole. When combined with diminished elastic recoil and an increased pressure decay rate, causes a fall in the diastolic pressure with age. - Continue home meds: amlodipine 186m losartan 10033mmetoprolol tartrate 50 mg BID - Continue management of existing chronic conditions including htn, hld, DM to prevent worsening of atherosclerosis.   Non-healing lower extremity wounds: Unlikely osteomyelitis given that she is afebrile w/out leukocytosis and no signs of infection. Radiographic findings negative. Wound likely not healing due to poor perfusion from patients PAD and/or lower extremity edema - Wound care consult - Continue to monitor for fever, drainage, and developing leukocytosis - Wlll refer to Dr. DudSharol Givenr management (possile amputation)  DM: Last A1c 7.7 (03/02/2016) - f/u repeat A1c - Continue home medication: Lantus 15U  qHS, Aspart 12U qAS - SSI  Diabetic neuropathy - Continue pregabalin 50 mg TID, Citalopram 20 mg daily  GERD: - pantoprazole 41m72mily  Sore throat: - Lozenge  FEN/GI:  - Vit D, KCl tablets - Miralax 17g BID - Heart healthy diet  DVT ppx: Lovenox 41mg53m Code: Full code  This is a [employer] information officer.  The care of the patient was discussed with Dr. KlimaEppie Gibsonthe assessment and plan formulated with his assistance.  Please see his attached note for official documentation of the daily encounter.   LOS: 0 days   ErnesArmen Pickupical Student 07/06/2016, 10:28 AM

## 2016-07-06 NOTE — H&P (Signed)
Internal Medicine Attending Admission Note Date: 07/06/2016  Patient name: [name] Medical record number: [id number] Date of birth: [birth year] Age: 75 y.o. Gender: female  I saw and evaluated the patient. I reviewed the resident's note and I agree with the resident's findings and plan as documented in the resident's note.  Chief Complaint(s): Chest pain radiating to the left neck and arm and epigastrium.  History - key components related to admission:  [name] is a 75 year old woman burden by vasculopathy including right lower extremity arterial disease requiring revascularization complicated by a paraprocedural stroke, diabetes, hypertension, and chronic diastolic heart failure who presents to the emergency room department with a complaint of chest pain 1 week. She describes the chest pain as a heaviness in the left lower chest that radiates to the neck and left arm. The pain can occur at rest and is associated with a fluttering feeling. She denies associated lightheadedness, nausea, or diaphoresis. With regards to shortness of breath she has orthopnea but is comfortable at rest. Of note, she was discharged from a skilled nursing facility 1 week ago after which the symptoms began. She had a nuclear medicine study at [facility] on 01/31/2016. Unfortunately the quality of the study was poor but minimal myocardial ischemia could not be ruled out, particularly in the inferoapical segments. Of note, in the emergency department she was hypertensive with systolic blood pressures over 200. She was admitted to the internal medicine teaching service for further evaluation of her chest pain and care.  Physical Exam - key components related to admission:  Vitals:   07/05/16 1700 07/05/16 1957 07/05/16 2322 07/06/16 0447  BP: (!) 172/52 (!) 159/55 (!) 157/60 (!) 122/47  Pulse: 78 86 90 77  Resp: '17 18  20  ' Temp:  98 F (36.7 C)  98.3 F (36.8 C)  TempSrc:  Oral  Oral  SpO2: 97% 99%  95%  Weight:       Height:       Gen.: Well-developed, well-nourished, woman lying comfortably in bed with the head of the bed at a 20-25 angle. Heart: Regular rate and rhythm with a 3/6 crescendo decrescendo murmur at the left upper sternal border, a reasonably loud S2, and no evidence of aortic regurgitation when leaning forward on exhalation with the bell of the stethoscope at the left second intercostal space. Legs: 2+ pitting edema to the knees with a long nondraining medial right tibial lesion and a lesion on the right foot which was not undressed. The toes of the right foot just proximal to the nails revealed some duskiness.  Lab results:  Basic Metabolic Panel:  Recent Labs  07/05/16 1129  NA 140  K 4.5  CL 106  CO2 24  GLUCOSE 206*  BUN 18  CREATININE 0.74  CALCIUM 9.6   CBC:  Recent Labs  07/05/16 1129  WBC 10.0  HGB 12.0  HCT 38.0  MCV 84.1  PLT 245   Cardiac Enzymes:  Recent Labs  07/05/16 1827 07/05/16 2345 07/06/16 0537  TROPONINI 0.06* 0.04* 0.09*   CBG:  Recent Labs  07/05/16 2335 07/06/16 0642 07/06/16 1137  GLUCAP 220* 188* 202*   Thyroid Function Tests:  Recent Labs  07/05/16 1640  TSH 1.054   Misc. Labs:  C-reactive protein less than 0.8 ESR 15  Imaging results:  Dg Chest 2 View  Result Date: 07/05/2016 CLINICAL DATA:  Lower leg infection EXAM: CHEST  2 VIEW COMPARISON:  03/12/2016 FINDINGS: Cardiac shadow remains enlarged. Patchy infiltrative changes  are again seen but mildly improved when compared with the prior exam. No sizable effusion is seen. No bony abnormality is noted. IMPRESSION: Patchy changes in the lungs bilaterally but improved from the prior exam. Electronically Signed   By: Inez Catalina M.D.   On: 07/05/2016 12:32   Dg Tibia/fibula Right  Result Date: 07/05/2016 CLINICAL DATA:  Right lower extremity infection EXAM: RIGHT TIBIA AND FIBULA - 2 VIEW COMPARISON:  None. FINDINGS: The tibia and fibula are within normal limits. The  previously seen fracture is no longer identified in the distal fibula. Vascular calcifications are seen. Considerable soft tissue changes are noted consistent with the underlying cellulitis. No findings to suggest osteomyelitis are seen. IMPRESSION: Soft tissue changes without acute bony abnormality. Electronically Signed   By: Inez Catalina M.D.   On: 07/05/2016 12:35   Dg Foot Complete Right  Result Date: 07/05/2016 CLINICAL DATA:  Right first toe infection EXAM: RIGHT FOOT COMPLETE - 3+ VIEW COMPARISON:  02/05/2014 FINDINGS: Generalized osteopenia is noted. There are degenerative changes of the first MTP joint with some suggestion of fusion. No area of bony erosion to suggest osteomyelitis is noted at this time. Considerable soft tissue swelling is noted consistent with the underlying history of infection. Vascular calcifications are seen. IMPRESSION: No definitive osteomyelitis is noted. There are changes at the first MTP joint suggestive of prior fusion. Soft tissue changes consistent with localized infection. Electronically Signed   By: Inez Catalina M.D.   On: 07/05/2016 12:33   I personally reviewed the chest x-ray from 07/05/2016. Unfortunately, it is an AP film so I cannot comment on cardiomegaly. The lung fields are clear without focal infiltrate. Compared to the previous chest x-ray, also a portable film, from 03/12/2016 there is resolution of the left basilar infiltrates.  Other results:  EKG: Normal sinus rhythm at 89 bpm, normal axis, normal intervals, no significant Q waves, no LVH by voltage, early R wave progression, no ST segment changes, inferolateral T wave inversions in a strain pattern, unchanged from the previous ECG on 03/04/2016.  Assessment & Plan by Problem:  [name] is a 75 year old vasculopath with a history of peripheral arterial disease requiring right lower extremity revascularization, recent cerebrovascular accident, diabetes, hypertension, and mild aortic stenosis who  presents with a one-week history of chest pain after returning home from a skilled nursing facility. In the emergency department she was noted to have a very high afterload as well as decompensation of her chronic systolic heart failure. She was admitted to the internal medicine teaching service for evaluation of chest pain and management of her acute on chronic diastolic heart failure.  1) Chest pain: She has had one week of chest pain since discharge from the skilled nursing facility. In the emergency department she was noted to have a markedly elevated afterload. Her ECG is unchanged from July of this year and she has had low-grade elevations in her troponins. Although she has relatively normal renal function looking back in her chart it appears she has had low-grade elevations in her troponins before during a time of decompensation of her cardiomyopathy. My suspicion is that her slightly elevated troponins are more related to her acute on chronic diastolic heart failure then coronary ischemia although she may have some demand ischemia related to her high afterload from poor blood pressure control after discharge from the skilled nursing facility. Undoubtedly, given that she is a vasculopath, she has coronary artery disease. The question is whether or not it requires further  characterization with a cardiac catheterization or if she would be best served by aggressively managing her cardiac risk factors with aggressive hypertension control and high intensity statin therapy instead. On examination her aortic stenosis is mild and therefor I do not believe this is contributing to her chest pain.  We will ask for Cardiology's input on the need for a cardiac cath given these slightly elevated troponins in a patient with at least 2 coronary equivalents or if we should continue to work on aggressive risk factor modification instead.  2) Acute on chronic diastolic heart failure: Likely secondary to an increased  afterload. We have started aggressive diuresis and successfully improved her afterload by reinstitution of her antihypertensive regimen. We will continue with the diuresis while she has volume on board. We will also assure that all of her blood pressures are well controlled prior to discharge and adjust the antihypertensive regimen as required.  3) Disposition: With regards to the chest pain and possible coronary artery disease we are awaiting Cardiology's input. Otherwise we are continuing with aggressive diuresis to better compensate her acute on chronic diastolic heart failure.  We will continue with the diuresis and afterload reduction until we begin to see a slight bump in the BUN suggesting that we have come close to her euvolemic state. At that point, she will be ready for discharge home. I anticipate this will be in the next 48-72 hours.

## 2016-07-06 NOTE — Consult Note (Addendum)
CARDIOLOGY CONSULT NOTE  Patient ID: [name] MRN: [id number] DOB/AGE: 12/26/1940 75 y.o.  Admit date: 07/05/2016 Referring Physician  Internal Medicine Teaching Service Primary Physician:  [name], MD Reason for Consultation  NSTEMI  HPI: [name]  is a 75 y.o. female  With History of hypertension, hyperlipidemia, diabetes mellitus, peripheral arterial disease, history of right lower extremity arterial bypass  in July 2017, periprocedural stroke and history of bilateral carotid stenting at that time, has very mild residual right-sided weakness. She has been having chest discomfort which is very vague that has been ongoing for the past 2-3 weeks. Some symptoms appeared to calm during her physical therapy however symptoms occur at rest and patient is essentially bedbound except when physical therapist comes in to see her she walks just a few steps with help of walker. Presently not having any rest pain. She complains of shortness of breath and dyspnea on exertion. She also complains of orthopnea.  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  Past Medical History:  Diagnosis Date  . Aortic stenosis   . Complication of anesthesia    Hard to wake patient up after having anesthesia  . Diabetes mellitus without complication ([place])   . Diabetic neuropathy (HCC)    Takes Lyrica  . Edema of both legs    Takes Lasix  . GERD (gastroesophageal reflux disease)   . High cholesterol   . HTN (hypertension)   . Hypertension   . Shortness of breath dyspnea   . Spasm of back muscles   . Wound, open    Right great toe     Past Surgical History:  Procedure Laterality Date  . BYPASS GRAFT POPLITEAL TO TIBIAL Right 02/28/2016   Procedure: BYPASS GRAFT RIGHT BELOW KNEE POPLITEAL TO PERONEAL USING REVERSED RIGHT GREATER SAPHENOUS VEIN;  Surgeon: Conrad Coloma, MD;  Location: [place];  Service: Vascular;  Laterality: Right;  . CYST  EXCISION     Abdomen  . EYE SURGERY Bilateral    Cataract removal  . INTRAMEDULLARY (IM) NAIL INTERTROCHANTERIC Left 02/04/2014   Procedure: INTRAMEDULLARY (IM) NAIL INTERTROCHANTRIC FEMORAL;  Surgeon: Mauri Pole, MD;  Location: [facility];  Service: Orthopedics;  Laterality: Left;  . IR GENERIC HISTORICAL  03/01/2016   IR ANGIO INTRA EXTRACRAN SEL COM CAROTID INNOMINATE UNI R MOD SED 03/01/2016 [name], MD [facility]  . IR GENERIC HISTORICAL  03/01/2016   IR ENDOVASC INTRACRANIAL INF OTHER THAN THROMBO ART INC DIAG ANGIO 03/01/2016 [name], MD [facility]  . IR GENERIC HISTORICAL  03/01/2016   IR INTRAVSC STENT CERV CAROTID W/O EMB-PROT MOD SED INC ANGIO 03/01/2016 [name], MD [facility]  . IR GENERIC HISTORICAL  06/03/2016   IR RADIOLOGIST EVAL & MGMT 06/03/2016 [facility]  . ORIF TOE FRACTURE Right 02/08/2014   Procedure: OPEN REDUCTION INTERNAL FIXATION Right METATARSAL  FRACTURE ;  Surgeon: Wylene Simmer, MD;  Location: [place];  Service: Orthopedics;  Laterality: Right;  . PERIPHERAL VASCULAR CATHETERIZATION N/A 12/28/2015   Procedure: Abdominal Aortogram w/Lower Extremity;  Surgeon: Conrad Grayson, MD;  Location: [place] [facility];  Service: Cardiovascular;  Laterality: N/A;  . RADIOLOGY WITH ANESTHESIA N/A 03/01/2016   Procedure: RADIOLOGY WITH ANESTHESIA;  Surgeon: [name], MD;  Location: [place];  Service: Radiology;  Laterality: N/A;  . VEIN HARVEST Right 02/28/2016   Procedure: RIGHT GREATER SAPHENOUS VEIN HARVEST;  Surgeon: Aaron Edelman  Starlyn Skeans, MD;  Location[facility];  Service: Vascular;  Laterality: Right;     Family History  Problem Relation Age of Onset  . Diabetes Other      Social History: Social History   Social History  . Marital status: Married    Spouse name: N/A  . Number of children: N/A  . Years of education: N/A   Occupational History  . Not on file.   Social History Main Topics  . Smoking status: Never Smoker  . Smokeless  tobacco: Never Used     Comment: Never smoked  . Alcohol use No  . Drug use: No  . Sexual activity: No   Other Topics Concern  . Not on file   Social History Narrative  . No narrative on file       Scheduled Meds: . amLODipine  10 mg Oral Daily  . aspirin  325 mg Oral Q breakfast  . [START ON 07/07/2016] atorvastatin  80 mg Oral Daily  . cholecalciferol  1,000 Units Oral Daily  . citalopram  20 mg Oral Daily  . clopidogrel  75 mg Oral Q breakfast  . docusate sodium  100 mg Oral BID  . enoxaparin (LOVENOX) injection  40 mg Subcutaneous Q24H  . fluticasone  1 spray Each Nare Daily  . furosemide  80 mg Intravenous BID  . insulin aspart  0-15 Units Subcutaneous TID WC  . insulin glargine  15 Units Subcutaneous BID  . isosorbide mononitrate  60 mg Oral Daily  . loratadine  10 mg Oral Daily  . losartan  100 mg Oral Daily  . metoprolol tartrate  50 mg Oral BID  . pantoprazole  40 mg Oral Daily  . polyethylene glycol  17 g Oral BID  . potassium chloride SA  20 mEq Oral Daily  . pregabalin  50 mg Oral TID   Continuous Infusions: PRN Meds:.acetaminophen, albuterol, nitroGLYCERIN    ROS: General: no fevers/chills/night sweats Eyes: no blurry vision, diplopia, or amaurosis ENT: no sore throat or hearing loss Resp: no cough, wheezing, or hemoptysis CV: no edema or palpitations GI: no abdominal pain, nausea, vomiting, diarrhea, or constipation GU: no dysuria, frequency, or hematuria Skin: no rash Neuro: no headache, numbness, tingling, or weakness of extremities Musculoskeletal: no joint pain or swelling Heme: no bleeding, DVT, or easy bruising Endo: no polydipsia or polyuria    Physical Exam: Blood pressure (!) 122/47, pulse 77, temperature 98.3 F (36.8 C), temperature source Oral, resp. rate 20, height 5\' 4"  (1.626 m), weight 95.3 kg (210 lb), SpO2 95 %.  Body mass index is 36.05 kg/m.   Neck: Short neck, difficulty evaluate JVD. Lungs: clear to auscultation  bilaterally Heart: S1 and S2 is normal, poor 6 systolic ejection murmur heard in the aortic area, conducted to the carotids. Abdomen: soft, non-tender; bowel sounds normal; no masses,  no organomegaly and Obese with large pannus. Extremities: venous stasis dermatitis noted and Bilateral upper extremities normal, right lower extremity has surgical tape, gangrenous toes that are well demarcated and appeared dry. Bilateral 2+ pitting edema below the knee. Pulses: Bilateral carotid bruit present, femoral pulses could not be felt due to obesity, popliteal pulse could not be felt due to obesity, pedal pulses are absent bilateral. Foot is warm without acute arterial insufficiency except for right toes that appear chronically gangrenous. Neurologic: Gait: Not tested, appears to have mild right-sided weakness.  Labs:   Lab Results  Component Value Date   WBC 10.0 07/05/2016  HGB 12.0 07/05/2016   HCT 38.0 07/05/2016   MCV 84.1 07/05/2016   PLT 245 07/05/2016    Recent Labs Lab 07/01/16 1704 07/05/16 1129  NA 139 140  K 4.2 4.5  CL 102 106  CO2 27 24  BUN 22* 18  CREATININE 0.81 0.74  CALCIUM 9.4 9.6  PROT 7.0  --   BILITOT 0.4  --   ALKPHOS 62  --   ALT 29  --   AST 29  --   GLUCOSE 99 206*    Lipid Panel     Component Value Date/Time   CHOL 109 03/02/2016 0600   TRIG 71 03/06/2016 0500   HDL 32 (L) 03/02/2016 0600   CHOLHDL 3.4 03/02/2016 0600   VLDL 24 03/02/2016 0600   LDLCALC 53 03/02/2016 0600    Recent Labs  02/29/16 0800 03/03/16 0849  BNP 133.3* 225.6*    HEMOGLOBIN A1C Lab Results  Component Value Date   HGBA1C 7.7 (H) 03/02/2016   MPG 174 03/02/2016    Recent Labs  07/05/16 1827 07/05/16 2345 07/06/16 0537  TROPONINI 0.06* 0.04* 0.09*    Recent Labs  03/02/16 1132 07/05/16 1640  TSH 0.151* 1.054    Echocardiogram 03/02/2016: Normal LV systolic function, EF 0000000. Grade 2 diastolic dysfunction. Mild aortic stenosis, aortic valve area 1.18  cm with peak gradient 27 and mean gradient 16 mmHg. Moderately dilated left atrium, small pericardial effusion, IVC is dilated with blunted respiratory response consistent with elevated central venous pressure.  EKG 07/05/2016: Normal sinus rhythm at rate of 89 bpm, normal axis. Nonspecific T-wave flattening.  Peripheral arteriogram 12/28/2015:  Widely patent aortic and iliac vascularture. Bilateral extensive tibial disease with no in-line flow to either foot  brisk filling of left foot via collaterals associated with left anterior tibial artery Slow filling of right foot via collaterals associated with right anterior tibial artery  S/P Bypass surgery 1. Right below-the-knee popliteal artery to peroneal artery bypass with reversed greater saphenous vein  2. Endarterectomy of mid-segment peroneal artery(Date: 02/28/16).  3.  History of CVA in July 2017 for left ICA high-grade stenosis status post stenting of bilateral carotid arteries including right innominate artery.   Radiology: Dg Chest 2 View  Result Date: 07/05/2016 CLINICAL DATA:  Lower leg infection EXAM: CHEST  2 VIEW COMPARISON:  03/12/2016 FINDINGS: Cardiac shadow remains enlarged. Patchy infiltrative changes are again seen but mildly improved when compared with the prior exam. No sizable effusion is seen. No bony abnormality is noted. IMPRESSION: Patchy changes in the lungs bilaterally but improved from the prior exam. Electronically Signed   By: Inez Catalina M.D.   On: 07/05/2016 12:32   Dg Tibia/fibula Right  Result Date: 07/05/2016 CLINICAL DATA:  Right lower extremity infection EXAM: RIGHT TIBIA AND FIBULA - 2 VIEW COMPARISON:  None. FINDINGS: The tibia and fibula are within normal limits. The previously seen fracture is no longer identified in the distal fibula. Vascular calcifications are seen. Considerable soft tissue changes are noted consistent with the underlying cellulitis. No findings to suggest osteomyelitis are seen.  IMPRESSION: Soft tissue changes without acute bony abnormality. Electronically Signed   By: Inez Catalina M.D.   On: 07/05/2016 12:35   Dg Foot Complete Right  Result Date: 07/05/2016 CLINICAL DATA:  Right first toe infection EXAM: RIGHT FOOT COMPLETE - 3+ VIEW COMPARISON:  02/05/2014 FINDINGS: Generalized osteopenia is noted. There are degenerative changes of the first MTP joint with some suggestion of fusion. No area of bony  erosion to suggest osteomyelitis is noted at this time. Considerable soft tissue swelling is noted consistent with the underlying history of infection. Vascular calcifications are seen. IMPRESSION: No definitive osteomyelitis is noted. There are changes at the first MTP joint suggestive of prior fusion. Soft tissue changes consistent with localized infection. Electronically Signed   By: Inez Catalina M.D.   On: 07/05/2016 12:33    Scheduled Meds: . amLODipine  10 mg Oral Daily  . aspirin  325 mg Oral Q breakfast  . atorvastatin  20 mg Oral Daily  . cholecalciferol  1,000 Units Oral Daily  . citalopram  20 mg Oral Daily  . clopidogrel  75 mg Oral Q breakfast  . docusate sodium  100 mg Oral BID  . enoxaparin (LOVENOX) injection  40 mg Subcutaneous Q24H  . fluticasone  1 spray Each Nare Daily  . furosemide  80 mg Intravenous BID  . insulin aspart  0-15 Units Subcutaneous TID WC  . insulin glargine  15 Units Subcutaneous BID  . loratadine  10 mg Oral Daily  . losartan  100 mg Oral Daily  . metoprolol tartrate  50 mg Oral BID  . pantoprazole  40 mg Oral Daily  . polyethylene glycol  17 g Oral BID  . potassium chloride SA  20 mEq Oral Daily  . pregabalin  50 mg Oral TID   Continuous Infusions: PRN Meds:.acetaminophen, albuterol  ASSESSMENT AND PLAN:  1. Acute coronary syndrome, non-ST elevation myocardial infarction. 2. Acute on chronic diastolic heart failure. 3. Severe peripheral artery disease as dictated above being managed by mask the surgery 4. Diabetes  mellitus type 2 uncontrolled with hyperglycemia 5. Moderate obesity 6. Hypertension 7. Hyperlipidemia  Recommendation: Patient is not a candidate for coronary angiography. She has severe orthopnea, recent stroke, continued ongoing risk factors, I would optimize her medically and only if she fails medical therapy would consider angiography as a last resort only for symptom relief. Increase her atorvastatin to 80 mg daily, add  isosorbide mononitrate. Sublingual nitroglycerin will also be prescribed. If symptoms persist, will consider addition of Ranexa. She does not need stress testing as management strategy will not change. I have discussed with the patient and her husband at the bedside and discussed regarding weight loss. Continue diuresis.  Overall she is very high mortality risk. Preprocedure risk for stroke is not low either.  Adrian Prows, MD 07/06/2016, 1:30 PM [facility]. [place] Pager: [phone] Office: [phone] If no answer Cell [phone]

## 2016-07-06 NOTE — Progress Notes (Signed)
Subjective: Currently, the patient is feeling ok w/ some resolution of [name]. She still complains of pain in her R Leg but this is unchanged. Swelling in BLE is significantly improved, but still w/ 2+ edema.  Interval Events: No events on Tele. Tropi mildly elevated on trend. Will continue.  Objective: Vital signs in last 24 hours: Vitals:   07/05/16 1700 07/05/16 1957 07/05/16 2322 07/06/16 0447  BP: (!) 172/52 (!) 159/55 (!) 157/60 (!) 122/47  Pulse: 78 86 90 77  Resp: 17 18  20   Temp:  98 F (36.7 C)  98.3 F (36.8 C)  TempSrc:  Oral  Oral  SpO2: 97% 99%  95%  Weight:      Height:       24-hour weight change: Filed Weights   07/05/16 1045  Weight: 210 lb (95.3 kg)   Dry Wt: ~200 lbs in June 2017.  Intake/Output:  12/01 0701 - 12/02 0700 In: 200 [P.O.:200] Out: -     Physical Exam: Physical Exam  Constitutional: She appears well-developed. She is cooperative. No distress.  Cardiovascular: Normal rate, regular rhythm, S1 normal and S2 normal.  Exam reveals no gallop.   Murmur heard.  Systolic murmur is present with a grade of 3/6  Pulses:      Radial pulses are 2+ on the right side, and 2+ on the left side.       Dorsalis pedis pulses are 1+ on the right side, and 1+ on the left side.  Pulmonary/Chest: Effort normal and breath sounds normal. No respiratory distress. Breasts are symmetrical.  Abdominal: Soft. Bowel sounds are normal. There is no tenderness.  Musculoskeletal: She exhibits edema (2+ BLE to the mid shin) and tenderness (TTP RLE).  Bandaged R medial calf wound, c/d/i. Bandaged R MTP wound, c/d/i.   Labs: CBC:  Recent Labs Lab 07/01/16 1704 07/05/16 1129  WBC 10.0 10.0  NEUTROABS 5.6  --   HGB 12.4 12.0  HCT 38.6 38.0  MCV 83.6 84.1  PLT 123456 [id number]   Metabolic Panel:  Recent Labs Lab 07/01/16 1704 07/05/16 1129  NA 139 140  K 4.2 4.5  CL 102 106  CO2 27 24  GLUCOSE 99 206*  BUN 22* 18  CREATININE 0.81 0.74  CALCIUM 9.4 9.6  ALT  29  --   ALKPHOS 62  --   BILITOT 0.4  --   PROT 7.0  --   ALBUMIN 3.7  --    Cardiac Labs:  Recent Labs Lab 07/05/16 1155 07/05/16 1827 07/05/16 2345 07/06/16 0537  TROPIPOC 0.07  --   --   --   TROPONINI  --  0.06* 0.04* 0.09*   BG:  Recent Labs Lab 07/05/16 2335 07/06/16 0642 07/06/16 1137  GLUCAP 220* 188* 202*   Lab Results  Component Value Date   HGBA1C 7.7 (H) 03/02/2016   Imaging: RLE XR w/o signs of osteo. CXR patchy infiltrates, improved from prior.   Medications: Scheduled Medications: . amLODipine  10 mg Oral Daily  . aspirin  325 mg Oral Q breakfast  . atorvastatin  20 mg Oral Daily  . cholecalciferol  1,000 Units Oral Daily  . citalopram  20 mg Oral Daily  . clopidogrel  75 mg Oral Q breakfast  . docusate sodium  100 mg Oral BID  . enoxaparin (LOVENOX) injection  40 mg Subcutaneous Q24H  . fluticasone  1 spray Each Nare Daily  . insulin aspart  0-15 Units Subcutaneous TID WC  .  insulin glargine  15 Units Subcutaneous BID  . loratadine  10 mg Oral Daily  . losartan  100 mg Oral Daily  . metoprolol tartrate  50 mg Oral BID  . pantoprazole  40 mg Oral Daily  . polyethylene glycol  17 g Oral BID  . potassium chloride SA  20 mEq Oral Daily  . pregabalin  50 mg Oral TID   PRN Medications: acetaminophen, albuterol  Assessment/Plan: [name] is a 75 y.o. 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  1) CP: Pt w/ unclear h/o including some typical and atypical features of ischemic CP. Patient is at very high risk for ischemic disease given HFpEF, HTN, obesity, DM and known PAD. POC Trop negative, but Tropi 0.06-->0.04-->0.09, EKG w/o significant change but demonstrates ?inferolateral ischemia unchanged from previous. Suspect demand ischemia 2/2  decompensation of HF and HTN. Question need for LHC given high risk and current symptoms. Card c/s, appreciate assistance. - continue to trend Trop I - diuresis as below - continue home meds: ASA, plavix, statin  2) HFpEF: Echo in July w/ EF of 55-60% and grade 2 diastolic dysfunction w/ significant LVH. Pt also w/ mild aortic stenosis, no wall motion abnormalities to suggest ischemic cardiomyopathy. 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 LE edema is reportedly worse in absence of home meds and likely worsened by HTN/tachycardia. Will continue diuresis today for afterload reduction in setting of concern for demand ischemia. - 80mg  IV Lasix BID  3) Non-healing leg wounds: Stable. Pt w/ ulceration following PAD bypass graft. Very little signs of wound healing. No current indications of infection w/o fever, WBC, discharge, or erythema and plain films reassuring for concern of osteo. S/p wound vac removal today. VVS not convinced perfusion is adequate to heal wounds and is concerned for risk of infection. Plan to refer to Dr. Sharol Given. Started on Doxycycline PO today by VVS, we will not continue. - WOC c/s - trend CBC, follow fever curve  4) L ICA occusion: Neuro exam stable. s/p B ICA stenting by IR. Persistent deficits of R weakness, difficutly w/ walking, and L facial paraesthesias. Now w/ new bilateral arm parasthesias R>L presenting over the last several days. Pt denies any other new focal changes. Would like f/u scheduled w/ Dr. Erlinda Hong for continued evaluation following her CVA, but she has not been able to accomplish this.  5) HTN: Elevated BPs to [id number] systolic, low diastolic pressure [id number]. Likely exacerbated by missed antihypertensives today. Restart home meds: amlodipine 10mg  and losartan 100mg . IV Hydral 5mg  PRN BP >180/100.  6) DM: Last A1c 7.7. Home meds: Lantus 15U qHS, Aspart 12U qAC. Continue home meds + SSI.  7) Paraesthesias: Pt presents with  multiple complaints of peripheral neuropathy which may be related to longstanding diabetes. Stocking-glove distribution, but also complains of some more radicular symptoms w/ radiation from neck to UE. Will continue home meds: pregabalin 50mg  TID, citalopram 20mg  qD.  Length of Stay: 0 day(s) Dispo: Anticipated discharge in approximately 1 day(s).  Holley Raring, MD Pager: [phone] (7AM-5PM) 07/06/2016, 1:08 PM

## 2016-07-06 NOTE — Discharge Summary (Signed)
Name: [name] MRN: [id number] DOB: 1940-11-21 75 y.o. PCP: [name], MD  Date of Admission: 07/05/2016 11:04 AM Date of Discharge: 07/09/2016 Attending Physician: Lucious Groves, DO  Discharge Diagnosis: 1. NSTEMI 2. HFpEF Exacerbation w/ hypervolemia  Principal Problem:   Acute on chronic diastolic heart failure (HCC) Active Problems:   HTN (hypertension)   Type 2 diabetes mellitus without complication, without long-term current use of insulin (HCC)   PAD (peripheral artery disease) (HCC)   Chest pain   Essential hypertension   Ulcer of right lower extremity ([place])   Discharge Medications:   Medication List    TAKE these medications   acetaminophen 500 MG tablet Commonly known as:  TYLENOL Take 500 mg by mouth every 6 (six) hours as needed for mild pain.   albuterol 108 (90 Base) MCG/ACT inhaler Commonly known as:  PROVENTIL HFA;VENTOLIN HFA Inhale 2 puffs into the lungs every 6 (six) hours as needed.   amLODipine 10 MG tablet Commonly known as:  NORVASC Take 1 tablet (10 mg total) by mouth daily.   aspirin 325 MG tablet Take 1 tablet (325 mg total) by mouth daily with breakfast. Start taking on:  07/10/2016 What changed:  Another medication with the same name was removed. Continue taking this medication, and follow the directions you see here.   atorvastatin 20 MG tablet Commonly known as:  LIPITOR Take 1 tablet (20 mg total) by mouth daily.   B-D INS SYRINGE 0.5CC/31GX5/16 31G X 5/16" 0.5 ML Misc Generic drug:  Insulin Syringe-Needle U-100   cetirizine 10 MG tablet Commonly known as:  ZYRTEC Take 10 mg by mouth daily.   cholecalciferol 1000 units tablet Commonly known as:  VITAMIN D Take 1,000 Units by mouth daily.   citalopram 20 MG tablet Commonly known as:  CELEXA Take 20 mg by mouth daily.   clopidogrel 75 MG tablet Commonly known as:  PLAVIX Take 1 tablet (75 mg total) by mouth daily with breakfast.   collagenase ointment Commonly  known as:  SANTYL Apply topically 2 (two) times daily. Apply to dorsum right foot wound daily.   docusate sodium 100 MG capsule Commonly known as:  COLACE Take 100 mg by mouth 2 (two) times daily.   doxycycline 100 MG capsule Commonly known as:  VIBRAMYCIN Take 1 capsule (100 mg total) by mouth 2 (two) times daily.   fluticasone 50 MCG/ACT nasal spray Commonly known as:  FLONASE Place 1 spray into both nostrils daily.   furosemide 40 MG tablet Commonly known as:  LASIX Take 80 mg by mouth 2 (two) times daily.   insulin aspart 100 UNIT/ML injection Commonly known as:  novoLOG Inject 0-15 Units into the skin 3 (three) times daily with meals. Before each meal 3 times a day, 140-199 - 3 units, 200-250 - 5 units, 251-299 - 8 units,  300-349 - 12 units,  350 or above 15 units. What changed:  how much to take  additional instructions   insulin glargine 100 UNIT/ML injection Commonly known as:  LANTUS Inject 0.15 mLs (15 Units total) into the skin 2 (two) times daily. What changed:  how much to take  when to take this   isosorbide mononitrate 60 MG 24 hr tablet Commonly known as:  IMDUR Take 1 tablet (60 mg total) by mouth daily. Start taking on:  07/10/2016   losartan 100 MG tablet Commonly known as:  COZAAR Take 1 tablet (100 mg total) by mouth daily.   metoprolol 50  MG tablet Commonly known as:  LOPRESSOR Take 1 tablet (50 mg total) by mouth 2 (two) times daily.   nitroGLYCERIN 0.4 MG SL tablet Commonly known as:  NITROSTAT Place 1 tablet (0.4 mg total) under the tongue every 5 (five) minutes as needed for chest pain.   omeprazole 20 MG capsule Commonly known as:  PRILOSEC Take 20 mg by mouth daily.   polyethylene glycol packet Commonly known as:  MIRALAX / GLYCOLAX Take 17 g by mouth 2 (two) times daily. What changed:  when to take this  reasons to take this   potassium chloride SA 20 MEQ tablet Commonly known as:  K-DUR,KLOR-CON Take 20 mEq by mouth  daily.   pregabalin 50 MG capsule Commonly known as:  LYRICA Take 50 mg by mouth 3 (three) times daily.   ranolazine 500 MG 12 hr tablet Commonly known as:  RANEXA Take 1 tablet (500 mg total) by mouth 2 (two) times daily.            Durable Medical Equipment        Start     Ordered   07/09/16 1557  For home use only DME Other see comment  Once    Comments:  Harrel Lemon Lift   07/09/16 1557      Disposition and follow-up:   Ms.[name] was discharged from [facility] in Stable condition.  At the hospital follow up visit please address:  1. CHF- Please assess the patient's volume status and adjust diuretics as appropriate.  2. CAD- She is not a good candidate for catheterization and will be medically managed. Her atorvastatin was increased to 80 mg and Imdur, sublingual nitroglycerin, and ranolazine were added to her medication regimen. Please make sure that she is tolerating these medication adjustments and is taking her medication as prescribed. Please also provide counseling on an appropriate heart-healthy diet.  3.  Diabetes- A1C 8.4 on admission. She may need her medication regimen adjusted and follow-up A1C in 3 months.  4.  Labs / imaging needed at time of follow-up: Hemoglobin A1C, fasting blood glucose, BMP  Follow-up Appointments: Follow-up Information    [name], MD. Schedule an appointment as soon as possible for a visit in 1 week(s).   Specialty:  Family Medicine Contact information: [place] [address] [place] [facility] Course by problem list: Principal Problem:   Acute on chronic diastolic heart failure (HCC) Active Problems:   HTN (hypertension)   Type 2 diabetes mellitus without complication, without long-term current use of insulin (HCC)   PAD (peripheral artery disease) (HCC)   Chest pain   Essential hypertension   Ulcer of right lower extremity ([place])   1. Chest pain Presented on  12/1 with 1 week of intermittent chest heaviness with pain radiating up to her neck in the setting of elevated blood pressure. EKG was largely unchanged from her previous in July. Troponins were trended q6h with a peak of 0.09 on 12/2. Presentation most consistent with demand ischemia related to increased afterload from poor blood pressure control. Cardiology consulted and determined that further characterization with stress testing and/or catheterization would not change management as she undoubtedly has coronary artery disease based on her multiple risk factors and coronary equivalents. She is not a good candidate for revascularization via craterization. Home aspirin and plavix continued. Atorvastatin increased to 80 mg daily and imdur added for symptomatic management. She was counseled about  diet modification.  2. Acute on chronic diastolic heart failure Evidence of fluid overload on presentation, including 2+ pitting edema to mid thigh. Troponin bump likely attributed to acute HF exacerbation in addition to ischemia. Etiology of acute exacerbation most likely secondary to increased afterload in the setting of uncontrolled blood pressure. Echo from 03/02/16: EF 55-60%, mild aortic stenosis, grade 2 diastolic dysfunction. She was diuresed with IV lasix 80 mg BID.   3. Hypertension  BP noted to be 195/54 in the ED. She was restarted on home medication regimen of amlodipine, losartan, and metoprolol tartrate. Widened pulse pressure noted, no evidence of aortic regurgitation on exam. Most recent echo only showed mild aortic stenosis.  4. Non-healing leg wounds Stable during hospitalization. No indication of infection w/o fever, WBC, discharge or erythema. Plain films obtained in ED showed no evidence of osteomyelitis. Doxycyline discontinued. PooWill plan refer to Dr. Sharol Given for further management   Discharge Vitals:   BP 100/72 (BP Location: Left Arm)   Pulse 68   Temp 97.7 F (36.5 C) (Oral)   Resp 17    Ht 5\' 4"  (1.626 m)   Wt 215 lb (97.5 kg)   SpO2 94%   BMI 36.90 kg/m   Pertinent Labs, Studies, and Procedures: As above.  Procedures Performed:  Dg Chest 2 View IMPRESSION: Patchy changes in the lungs bilaterally but improved from the prior exam.  Dg Tibia/fibula Right IMPRESSION: Soft tissue changes without acute bony abnormality.  Dg Foot Complete Right IMPRESSION: No definitive osteomyelitis is noted. There are changes at the first MTP joint suggestive of prior fusion. Soft tissue changes consistent with localized infection.  Consultations:   Discharge Instructions: Discharge Instructions    Call MD for:  persistant nausea and vomiting    Complete by:  As directed    Call MD for:  redness, tenderness, or signs of infection (pain, swelling, redness, odor or green/yellow discharge around incision site)    Complete by:  As directed    Call MD for:  severe uncontrolled pain    Complete by:  As directed    Call MD for:  temperature >100.4    Complete by:  As directed    Diet - low sodium heart healthy    Complete by:  As directed    Increase activity slowly    Complete by:  As directed     You were admitted to [facility] for heart failure exacerbation. We gave you medicine to reduce the amount of fluid in your body. We also added some medications to help reduce your chest pain. Please take all of your medication as prescribed. In addition, we recommend that you limit the amount of salt in your diet.and encourage you to talk to your primary care doctor about diet plan. We have scheduled a follow-up appointment with your primary care doctor. If you notice increasing swelling or chest pain, please do not wait for your outpatient appointment and go immediately to the Emergency Department.   Signed: Holley Raring, MD 07/09/2016, 3:59 PM   Pager: [phone]

## 2016-07-06 NOTE — Progress Notes (Signed)
Internal Medicine Attending  Date: 07/06/2016  Patient name: [name] Medical record number: [id number] Date of birth: 05-01-1941 Age: 75 y.o. Gender: female  I saw and evaluated the patient. I reviewed the resident's note by Dr. Gay Filler and I agree with the resident's findings and plans as documented in his progress note.  Please see my H&P dated 07/06/2016 for the specifics of my evaluation, assessment, and plan from earlier today.

## 2016-07-07 LAB — GLUCOSE, CAPILLARY
GLUCOSE-CAPILLARY: 109 mg/dL — AB (ref 65–99)
GLUCOSE-CAPILLARY: 254 mg/dL — AB (ref 65–99)
Glucose-Capillary: 152 mg/dL — ABNORMAL HIGH (ref 65–99)
Glucose-Capillary: 126 mg/dL — ABNORMAL HIGH (ref 65–99)

## 2016-07-07 LAB — TROPONIN I: Troponin I: 0.05 ng/mL (ref <0.03)

## 2016-07-07 LAB — HEMOGLOBIN A1C
Hgb A1c MFr Bld: 8.4 % — ABNORMAL HIGH (ref 4.8–5.6)
MEAN PLASMA GLUCOSE: 194 mg/dL

## 2016-07-07 NOTE — Progress Notes (Signed)
Internal Medicine Attending  Date: 07/07/2016  Patient name: [name] Medical record number: [id number] Date of birth: Apr 30, 1941 Age: 75 y.o. Gender: female  I saw and evaluated the patient. I reviewed the resident's note by Dr. Marlowe Sax and I agree with the resident's findings and plans as documented in her progress note.  Ms. Wessell feels slightly better today although still complains of swelling and pain associated with the swelling in the hands and the legs. Reportedly her dry weight is 200 pounds and she's at least 10 pounds from that target. Her afterload has improved and her angina as well as cardiovascular risk factors are being aggressively treated pharmacologically. I doubt she will be ready for discharge from a volume overload standpoint within the next 24 hours and would recommend continuing aggressive diuresis, paying close attention to her weights and BUN as well as physical examination.

## 2016-07-07 NOTE — Progress Notes (Signed)
Subjective:  Feels better. Much more alert and breathing better. No chest pain  Objective:  Vital Signs in the last 24 hours: Temp:  [97.8 F (36.6 C)-98.5 F (36.9 C)] 97.8 F (36.6 C) (12/03 0451) Pulse Rate:  [73-77] 75 (12/03 0451) Resp:  [16-20] 16 (12/03 0451) BP: (115-145)/(40-45) 135/45 (12/03 0451) SpO2:  [95 %-96 %] 95 % (12/03 0451) Weight:  [99.1 kg (218 lb 6.4 oz)] 99.1 kg (218 lb 6.4 oz) (12/03 0451)  Physical Exam:  Neck: Short neck, difficulty evaluate JVD. Lungs: clear to auscultation bilaterally Heart: S1 and S2 is normal, poor 6 systolic ejection murmur heard in the aortic area, conducted to the carotids. Abdomen: soft, non-tender; bowel sounds normal; no masses,  no organomegaly and Obese with large pannus. Extremities: venous stasis dermatitis noted and Bilateral upper extremities normal, right lower extremity has surgical tape, gangrenous toes that are well demarcated and appeared dry. Bilateral 2+ pitting edema below the knee. Pulses: Bilateral carotid bruit present, femoral pulses could not be felt due to obesity, popliteal pulse could not be felt due to obesity, pedal pulses are absent bilateral. Foot is warm without acute arterial insufficiency except for right greater toe that appear to have dry  gangrene. Neurologic: Gait: Not tested, appears to have mild right-sided weakness.    Lab Results: BMP  Recent Labs  03/13/16 0722 07/01/16 1704 07/05/16 1129  NA 142 139 140  K 4.1 4.2 4.5  CL 102 102 106  CO2 33* 27 24  GLUCOSE 177* 99 206*  BUN 13 22* 18  CREATININE 0.50 0.81 0.74  CALCIUM 8.5* 9.4 9.6  GFRNONAA >60 >60 >60  GFRAA >60 >60 >60    CBC  Recent Labs Lab 07/01/16 1704 07/05/16 1129  WBC 10.0 10.0  RBC 4.62 4.52  HGB 12.4 12.0  HCT 38.6 38.0  PLT 273 245  MCV 83.6 84.1  MCH 26.9 26.5  MCHC 32.2 31.6  RDW 20.0* 18.4*  LYMPHSABS 2.9  --   MONOABS 0.6  --   EOSABS 0.7  --   BASOSABS 0.2*  --     HEMOGLOBIN A1C Lab  Results  Component Value Date   HGBA1C 7.7 (H) 03/02/2016   MPG 174 03/02/2016    Recent Labs  07/06/16 1326 07/06/16 1842 07/07/16 0045  TROPONINI 0.05* 0.04* 0.05*    Recent Labs  03/02/16 1132 07/05/16 1640  TSH 0.151* 1.054   Lipid Panel     Component Value Date/Time   CHOL 109 03/02/2016 0600   TRIG 71 03/06/2016 0500   HDL 32 (L) 03/02/2016 0600   CHOLHDL 3.4 03/02/2016 0600   VLDL 24 03/02/2016 0600   LDLCALC 53 03/02/2016 0600     Hepatic Function Panel  Recent Labs  03/04/16 1200  03/07/16 0422 03/08/16 0539 07/01/16 1704  PROT 5.0*  < > 5.5* 4.8* 7.0  ALBUMIN 2.2*  < > 2.3* 2.2* 3.7  AST 19  < > 17 51* 29  ALT 26  < > 30 37 29  ALKPHOS 55  < > 52 51 62  BILITOT 0.5  < > 0.4 1.7* 0.4  BILIDIR <0.1*  --   --   --   --   IBILI NOT CALCULATED  --   --   --   --   < > = values in this interval not displayed.  Imaging: Imaging results have been reviewed  Cardiac Studies: Echocardiogram 03/02/2016: Normal LV systolic function, EF 0000000. Grade 2 diastolic dysfunction. Mild aortic  stenosis, aortic valve area 1.18 cm with peak gradient 27 and mean gradient 16 mmHg. Moderately dilated left atrium, small pericardial effusion, IVC is dilated with blunted respiratory response consistent with elevated central venous pressure.  EKG 07/05/2016: Normal sinus rhythm at rate of 89 bpm, normal axis. Nonspecific T-wave flattening.  Peripheral arteriogram 12/28/2015:  Widely patent aortic and iliac vascularture. Bilateral extensive tibial disease with no in-line flow to either foot  brisk filling of left foot via collaterals associated with left anterior tibial artery Slow filling of right foot via collaterals associated with right anterior tibial artery  S/P Bypass surgery 1. Right below-the-knee popliteal artery to peroneal artery bypass with reversed greater saphenous vein  2. Endarterectomy of mid-segment peroneal artery(Date: 02/28/16).  3.  History of CVA  in July 2017 for left ICA high-grade stenosis status post stenting of bilateral carotid arteries including right innominate artery  Scheduled Meds: . amLODipine  10 mg Oral Daily  . aspirin  325 mg Oral Q breakfast  . atorvastatin  80 mg Oral Daily  . cholecalciferol  1,000 Units Oral Daily  . citalopram  20 mg Oral Daily  . clopidogrel  75 mg Oral Q breakfast  . docusate sodium  100 mg Oral BID  . enoxaparin (LOVENOX) injection  40 mg Subcutaneous Q24H  . fluticasone  1 spray Each Nare Daily  . furosemide  80 mg Intravenous BID  . insulin aspart  0-15 Units Subcutaneous TID WC  . insulin glargine  15 Units Subcutaneous BID  . isosorbide mononitrate  60 mg Oral Daily  . loratadine  10 mg Oral Daily  . losartan  100 mg Oral Daily  . metoprolol tartrate  50 mg Oral BID  . pantoprazole  40 mg Oral Daily  . polyethylene glycol  17 g Oral BID  . potassium chloride SA  20 mEq Oral Daily  . pregabalin  50 mg Oral TID  . ranolazine  500 mg Oral BID   Continuous Infusions: PRN Meds:.acetaminophen, albuterol, nitroGLYCERIN   Assessment/Plan:  1. Acute coronary syndrome, doubt AMI or  non-ST elevation myocardial infarction. S. Trop flat. Suspect demand ischemia from acute on chronic diastolic heart failure 2. Acute on chronic diastolic heart failure. 3. Severe peripheral artery disease as dictated above being managed by mask the surgery 4. Diabetes mellitus type 2 uncontrolled with hyperglycemia 5. Moderate obesity 6. Hypertension 7. Hyperlipidemia   Rec: Medical therapy for presumed CAD. High risk for coronary angiogram. I have also added Ranexa 500 mg BID. Tolerating Metoprolol and Isosorbide mononitrate.  I will f/u in the outpatient basis or she can follow Jordan Hawks, MD in [place]. Needs PT/OT eval and plans for discharge.  Adrian Prows, M.D. 07/07/2016, 11:36 AM [facility] Pager: [phone] Office: [phone] If no answer: [phone]

## 2016-07-07 NOTE — Progress Notes (Signed)
Subjective: Patient reports feeling well and has no complaints. Denies having any CP or SOB.  Objective: Vital signs in last 24 hours: Vitals:   07/06/16 0447 07/06/16 1411 07/06/16 2048 07/07/16 0451  BP: (!) 122/47 (!) 145/43 (!) 115/40 (!) 135/45  Pulse: 77 73 77 75  Resp: 20 20 18 16   Temp: 98.3 F (36.8 C) 98.5 F (36.9 C) 98.4 F (36.9 C) 97.8 F (36.6 C)  TempSrc: Oral Oral Oral Oral  SpO2: 95% 96% 95% 95%  Weight:    99.1 kg (218 lb 6.4 oz)  Height:       24-hour weight change: Filed Weights   07/05/16 1045 07/07/16 0451  Weight: 95.3 kg (210 lb) 99.1 kg (218 lb 6.4 oz)   Dry Wt: ~200 lbs in June 2017.  Intake/Output:  No intake/output data recorded.    Physical Exam: Physical Exam  Constitutional: She is oriented to person, place, and time. She appears well-developed and well-nourished. No distress.  Cardiovascular: Normal rate, regular rhythm and intact distal pulses.   Murmur heard. Grade 3/6 systolic ejection murmur best appreciated at the right upper sternal border   Pulmonary/Chest: Effort normal and breath sounds normal. No respiratory distress. She has no wheezes. She has no rales.  Abdominal: Soft. Bowel sounds are normal. She exhibits no distension. There is no tenderness.  Musculoskeletal: She exhibits edema.  +1 to +2 pitting edema of bilateral lower extremities  Neurological: She is alert and oriented to person, place, and time.  Skin: Skin is warm and dry.   Labs: CBC:  Recent Labs Lab 07/01/16 1704 07/05/16 1129  WBC 10.0 10.0  NEUTROABS 5.6  --   HGB 12.4 12.0  HCT 38.6 38.0  MCV 83.6 84.1  PLT 123456 [id number]   Metabolic Panel:  Recent Labs Lab 07/01/16 1704 07/05/16 1129  NA 139 140  K 4.2 4.5  CL 102 106  CO2 27 24  GLUCOSE 99 206*  BUN 22* 18  CREATININE 0.81 0.74  CALCIUM 9.4 9.6  ALT 29  --   ALKPHOS 62  --   BILITOT 0.4  --   PROT 7.0  --   ALBUMIN 3.7  --    Cardiac Labs:  Recent Labs Lab 07/05/16 1155   07/05/16 2345 07/06/16 0537 07/06/16 1326 07/06/16 1842 07/07/16 0045  TROPIPOC 0.07  --   --   --   --   --   --   TROPONINI  --   < > 0.04* 0.09* 0.05* 0.04* 0.05*  < > = values in this interval not displayed. BG:  Recent Labs Lab 07/06/16 0642 07/06/16 1137 07/06/16 1632 07/06/16 2044 07/07/16 0636  GLUCAP 188* 202* 143* 133* 109*     Lab Results  Component Value Date   HGBA1C 7.7 (H) 03/02/2016    Imaging: RLE XR w/o signs of osteo. CXR patchy infiltrates, improved from prior.   Medications: Scheduled Medications: . amLODipine  10 mg Oral Daily  . aspirin  325 mg Oral Q breakfast  . atorvastatin  80 mg Oral Daily  . cholecalciferol  1,000 Units Oral Daily  . citalopram  20 mg Oral Daily  . clopidogrel  75 mg Oral Q breakfast  . docusate sodium  100 mg Oral BID  . enoxaparin (LOVENOX) injection  40 mg Subcutaneous Q24H  . fluticasone  1 spray Each Nare Daily  . furosemide  80 mg Intravenous BID  . insulin aspart  0-15 Units Subcutaneous TID WC  . insulin  glargine  15 Units Subcutaneous BID  . isosorbide mononitrate  60 mg Oral Daily  . loratadine  10 mg Oral Daily  . losartan  100 mg Oral Daily  . metoprolol tartrate  50 mg Oral BID  . pantoprazole  40 mg Oral Daily  . polyethylene glycol  17 g Oral BID  . potassium chloride SA  20 mEq Oral Daily  . pregabalin  50 mg Oral TID  . ranolazine  500 mg Oral BID   PRN Medications: acetaminophen, albuterol, nitroGLYCERIN  Assessment/Plan: [name] is a 75 y.o. 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  1) CP: Patient denies having any CP at present. She is at very high risk for ischemic disease given HFpEF, HTN, obesity, DM and known PAD. Troponin peaked at 0.09 likely due to demand ischemia 2/2 decompensation of  HF and HTN. Per cardiology, she is not a candidate for coronary angiography. They are recommending optimization of medical therapy. If she fails, medical therapy, they will consider angiography as a last report only for symptom relief.  - Atorvastatin increased to 80 mg daily - Started on isosorbide mononitrate  - Started on SL nitroglycerin  - continue home meds: ASA, plavix  2) HFpEF: Echo in July w/ EF of 55-60% and grade 2 diastolic dysfunction w/ significant LVH. Pt also w/ mild aortic stenosis, no wall motion abnormalities to suggest ischemic cardiomyopathy. 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 Continues to have lower extremity edema, however, slightly improved on exam today. Will continue diuresis today.  - 80mg  IV Lasix BID  3) Non-healing leg wounds: Stable. Pt w/ ulceration following PAD bypass graft. Very little signs of wound healing. No current indications of infection w/o fever, WBC, discharge, or erythema and plain films reassuring for concern of osteo.  - WOC c/s  4) L ICA occusion: s/p B ICA stenting by IR. Persistent deficits of R weakness, difficutly w/ walking, and L facial paraesthesias. Now w/ new bilateral arm parasthesias R>L presenting over the last several days. Pt denies any other new focal changes.  - Will need f/u scheduled w/ Dr. Erlinda Hong for continued evaluation following her CVA  5) HTN: Systolic in [id number] and diastolic in [id number] at present.  - Continue home meds amlodipine 10mg  QD, losartan 100mg  QD, and metoprolol 50 BID  6) DM: Last A1c 7.7. Home meds: Lantus 15U qHS, Aspart 12U qAC.  - Continue home meds + SSI.  7) Paraesthesias: Pt presented with multiple complaints of peripheral neuropathy which may be related to longstanding diabetes. Stocking-glove distribution, but also complains of some more radicular symptoms w/ radiation from neck to UE.  - Will continue home meds: pregabalin 50mg  TID, citalopram 20mg   qD.  Length of Stay: 1 day(s) Dispo: Anticipated discharge in approximately 1 day(s).  Shela Leff, MD Pager: [phone] (7AM-5PM) 07/07/2016, 10:27 AM

## 2016-07-07 NOTE — Progress Notes (Signed)
Subjective: Doing well today. She denies chest pain. Endorses some shortness of breath. Feels like her lower extremity swelling has not gotten better.  Objective: Vital signs in last 24 hours: Temp:  [97.8 F (36.6 C)-98.5 F (36.9 C)] 97.8 F (36.6 C) (12/03 0451) Pulse Rate:  [73-77] 75 (12/03 0451) Resp:  [16-20] 16 (12/03 0451) BP: (115-145)/(40-45) 135/45 (12/03 0451) SpO2:  [95 %-96 %] 95 % (12/03 0451) Weight:  [99.1 kg (218 lb 6.4 oz)] 99.1 kg (218 lb 6.4 oz) (12/03 0451)  Weight change: 3.81 kg (8 lb 6.4 oz) No intake or output data in the 24 hours ending 07/07/16 0831   Physical Exam: General: well appearing, no acute distress, laying at about 45 degrees HEENT: moist mucous membranes, oropharynx clear CV: Regular rate and rhythm, 3/6 systolic murmur loudest at left upper sternal border Pulm: normal work of breathing, clear to auscultation bilaterally Neuro: right sided weakness, left facial numbness and weakness Ext: 2+ edema up to the knee, weak pulses Skin: Dressings clean and dry.  Lab Results: Results for orders placed or performed during the hospital encounter of 07/05/16 (from the past 24 hour(s))  Glucose, capillary     Status: Abnormal   Collection Time: 07/06/16 11:37 AM  Result Value Ref Range   Glucose-Capillary 202 (H) 65 - 99 mg/dL   Comment 1 Notify RN   Troponin I (q 6hr x 3)     Status: Abnormal   Collection Time: 07/06/16  1:26 PM  Result Value Ref Range   Troponin I 0.05 (HH) <0.03 ng/mL  Glucose, capillary     Status: Abnormal   Collection Time: 07/06/16  4:32 PM  Result Value Ref Range   Glucose-Capillary 143 (H) 65 - 99 mg/dL   Comment 1 Notify RN   Troponin I (q 6hr x 3)     Status: Abnormal   Collection Time: 07/06/16  6:42 PM  Result Value Ref Range   Troponin I 0.04 (HH) <0.03 ng/mL  Glucose, capillary     Status: Abnormal   Collection Time: 07/06/16  8:44 PM  Result Value Ref Range   Glucose-Capillary 133 (H) 65 - 99 mg/dL    Troponin I (q 6hr x 3)     Status: Abnormal   Collection Time: 07/07/16 12:45 AM  Result Value Ref Range   Troponin I 0.05 (HH) <0.03 ng/mL  Glucose, capillary     Status: Abnormal   Collection Time: 07/07/16  6:36 AM  Result Value Ref Range   Glucose-Capillary 109 (H) 65 - 99 mg/dL    Studies/Results:  Medications: I have reviewed the patient's current medications. Scheduled Meds: . amLODipine  10 mg Oral Daily  . aspirin  325 mg Oral Q breakfast  . atorvastatin  80 mg Oral Daily  . cholecalciferol  1,000 Units Oral Daily  . citalopram  20 mg Oral Daily  . clopidogrel  75 mg Oral Q breakfast  . docusate sodium  100 mg Oral BID  . enoxaparin (LOVENOX) injection  40 mg Subcutaneous Q24H  . fluticasone  1 spray Each Nare Daily  . furosemide  80 mg Intravenous BID  . insulin aspart  0-15 Units Subcutaneous TID WC  . insulin glargine  15 Units Subcutaneous BID  . isosorbide mononitrate  60 mg Oral Daily  . loratadine  10 mg Oral Daily  . losartan  100 mg Oral Daily  . metoprolol tartrate  50 mg Oral BID  . pantoprazole  40 mg Oral Daily  .  polyethylene glycol  17 g Oral BID  . potassium chloride SA  20 mEq Oral Daily  . pregabalin  50 mg Oral TID  . ranolazine  500 mg Oral BID   Continuous Infusions: PRN Meds:.acetaminophen, albuterol, nitroGLYCERIN   Assessment/Plan: Active Problems:   HTN (hypertension)   Type 2 diabetes mellitus without complication, without long-term current use of insulin (HCC)   Acute diastolic CHF (congestive heart failure) (HCC)   PAD (peripheral artery disease) (HCC)   Chest pain   Essential hypertension   Acute on chronic diastolic heart failure (HCC)   Ulcer of right lower extremity (HCC)  PROMISE CREA is a 75 y.o. female with 1 week of atypical chest pain and worsening lower extremity edema most likely consistent with demand ischemia and acute HF in the setting of uncontrolled hypertension.  Chest pain:  CAD is the most likely the  underlying etiology of the patient's chest pain given her multiple risk factors and history. Mechanistically, her chest pain is probably due to demand ischemia in the setting of increased afterload from uncontrolled hypertension. SPECT in 01/2016 showed possibility of minimal myocardial ischemia; however the quality of the study was poor.  - Tn 0.07 on admission; peaked at 0.09 on 12/2 - Per cardiology stress test and coronary angiography would not change management. She undoubtedly has CAD. She is not a good candidate for catheterization. Will manage medically and optimize medical therapy - continue home Aspirin, Plavix - increase atorvastatin to 80 mg daily - add isosorbide mononitrate and sublingual nitro  HFpEF: Echo (03/02/16): EF 55-60%, mild aortic stenosis, grade 2 diastolic dysfunction. 2+ edema up to the knees today - continue IV lasix 80mg  BID  Hypertension, widened pulse pressure: In the ED, BP was noted to be 195/54. Today 122/47. Pulse pressure still noted to be widened. No evidence of aortic regurgitation on exam. May be related to age and patient's existing atherosclerotic disease. Per UpToDate, stiffening of the aorta with age reduces the elastic reservoir capacity, thus more blood runs off from each stroke volume during systole, resulting in a reduced blood volume within the aorta at the onset of diastole. When combined with diminished elastic recoil and an increased pressure decay rate, causes a fall in the diastolic pressure with age. - Continue home meds: amlodipine 10mg , losartan 100mg , metoprolol tartrate 50 mg BID - Continue management of existing chronic conditions including htn, hld, DM to prevent worsening of atherosclerosis.   Non-healing lower extremity wounds: Unlikely osteomyelitis given that she is afebrile w/out leukocytosis and no signs of infection. Radiographic findings negative. Wound likely not healing due to poor perfusion from patients PAD and/or lower extremity  edema - Wound care consult - Continue to monitor for fever, drainage, and developing leukocytosis - Wlll refer to Dr. Sharol Given for management (possile amputation)  DM: Last A1c 7.7 (03/02/2016) - f/u repeat A1c - Continue home medication: Lantus 15U qHS, Aspart 12U qAS - SSI  Diabetic neuropathy - Continue pregabalin 50 mg TID, Citalopram 20 mg daily  GERD: - pantoprazole 40mg  daily  FEN/GI:  - Vit D, KCl tablets - Miralax 17g BID - Heart healthy diet  DVT ppx: Lovenox 40mg  IV  Code: Full code  This is a Careers information officer Note.  The care of the patient was discussed with Dr. Eppie Gibson and the assessment and plan formulated with his assistance.  Please see his attached note for official documentation of the daily encounter.   LOS: 1 day   Armen Pickup, Medical Student  07/07/2016, 8:31 AM

## 2016-07-08 DIAGNOSIS — I5033 Acute on chronic diastolic (congestive) heart failure: Secondary | ICD-10-CM | POA: Diagnosis present

## 2016-07-08 LAB — GLUCOSE, CAPILLARY
GLUCOSE-CAPILLARY: 155 mg/dL — AB (ref 65–99)
Glucose-Capillary: 148 mg/dL — ABNORMAL HIGH (ref 65–99)
Glucose-Capillary: 178 mg/dL — ABNORMAL HIGH (ref 65–99)
Glucose-Capillary: 322 mg/dL — ABNORMAL HIGH (ref 65–99)

## 2016-07-08 LAB — BASIC METABOLIC PANEL
Anion gap: 7 (ref 5–15)
BUN: 15 mg/dL (ref 6–20)
CHLORIDE: 104 mmol/L (ref 101–111)
CO2: 27 mmol/L (ref 22–32)
Calcium: 9.1 mg/dL (ref 8.9–10.3)
Creatinine, Ser: 0.87 mg/dL (ref 0.44–1.00)
GFR calc Af Amer: >60 mL/min (ref >60)
GLUCOSE: 158 mg/dL — AB (ref 65–99)
POTASSIUM: 3.7 mmol/L (ref 3.5–5.1)
Sodium: 138 mmol/L (ref 135–145)

## 2016-07-08 MED ORDER — ACETAMINOPHEN 500 MG PO TABS
1000.0000 mg | ORAL_TABLET | Freq: Four times a day (QID) | ORAL | Status: DC | PRN
  Administered 2016-07-08 – 2016-07-09 (×3): 1000 mg via ORAL
  Filled 2016-07-08 (×3): qty 2

## 2016-07-08 MED ORDER — PREGABALIN 100 MG PO CAPS
100.0000 mg | ORAL_CAPSULE | Freq: Three times a day (TID) | ORAL | Status: DC
  Administered 2016-07-08 – 2016-07-09 (×4): 100 mg via ORAL
  Filled 2016-07-08 (×4): qty 1

## 2016-07-08 MED ORDER — LIVING WELL WITH DIABETES BOOK
Freq: Once | Status: DC
  Filled 2016-07-08: qty 1

## 2016-07-08 NOTE — Progress Notes (Addendum)
Inpatient Diabetes Program Recommendations  AACE/ADA: New Consensus Statement on Inpatient Glycemic Control (2015)  Target Ranges:  Prepandial:   less than 140 mg/dL      Peak postprandial:   less than 180 mg/dL (1-2 hours)      Critically ill patients:  140 - 180 mg/dL   Lab Results  Component Value Date   GLUCAP 148 (H) 07/08/2016   HGBA1C 8.4 (H) 07/06/2016   Results for [name], [name] (MRN [id number]) as of 07/08/2016 08:46  Ref. Range 07/07/2016 06:36 07/07/2016 10:58 07/07/2016 16:30 07/07/2016 21:58 07/08/2016 06:44  Glucose-Capillary Latest Ref Range: 65 - 99 mg/dL 109 (H) 254 (H) 152 (H) 126 (H) 148 (H)   Review of Glycemic Control  Diabetes history:       DM2, Chest Pain this admission, non-healing right foot wound, obese Outpatient Diabetes medications:       Novolog 12 units TIDAC, Lantus 30 units QHS Current orders for Inpatient glycemic control:       Novolog 0-15 units TIDAC, Lantus 15 units BID  Inpatient Diabetes Program Recommendations:      Patient is currently not needing meal coverage (as was taking at home) and is eating 85-100% of her meals while following a carb modified diet.  However, her A1C of 8.4% indicates an average CBG of 222 mg/dL.  CBG's over 180 increase patient's risks of complications.  Long-term, this patient needs to maintain control of DM at home.        Please consider discharging patient on the correction dosage of Novolog 0-15 units TIDAC and 0-5 units QHS and CBG monitoring QID.        Please consider stopping meal coverage of Novolog 12 units at discharge (from Home Med's list).  RD consultation placed.  Living Well with Diabetes ordered.  Note: Spoke with patient in room regarding self-management of diabetes  1.  Taught patient the following (teach back and/or return demonstration):  Current DM medications (what these are, why taking, when taking, how taking, common S.E.'s)  CBG monitoring, A1C - what these numbers mean and why this is  important for wound healing and heart health  How/why to check feet every day  Carb modified diet  2.  Identified barriers and facilitators to self-management goals:  Knowledge deficit regarding what carbohydrates are and how to count carbohydrates (consult to RD made)  3.  Identified support systems:  None mentioned  Thank you,  Windy Carina, RN, BSN Diabetes Coordinator Inpatient Diabetes Program [phone] (Team Pager)

## 2016-07-08 NOTE — Progress Notes (Signed)
Subjective: She is doing well today and feels like some swelling has gone down. She denies chest pain. She continues to complain of pain in her arms and legs. She would like to try to get up and sit in the chair today.  Objective: Vital signs in last 24 hours: Temp:  [97.6 F (36.4 C)-97.9 F (36.6 C)] 97.9 F (36.6 C) (12/04 0414) Pulse Rate:  [69-73] 69 (12/04 0414) Resp:  [18] 18 (12/04 0414) BP: (99-123)/(39-86) 123/41 (12/04 0414) SpO2:  [93 %-95 %] 95 % (12/04 0414) Weight:  [97.5 kg (215 lb)] 97.5 kg (215 lb) (12/04 0246)  Weight change: -1.542 kg (-3 lb 6.4 oz)  Intake/Output Summary (Last 24 hours) at 07/08/16 1048 Last data filed at 07/07/16 2202  Gross per 24 hour  Intake              600 ml  Output                0 ml  Net              600 ml     Physical Exam: General: well appearing, no acute distress, laying at about 45 degrees HEENT: moist mucous membranes, oropharyn CV: Regular rate and rhythm, 3/6 systolic murmur loudest at left upper sternal border Pulm: normal work of breathing, clear to auscultation bilaterally Ext: 1+ edema up to the knee, weak pulses, swelling decreased in hands Skin: Dressings clean and dry.  Lab Results: Results for orders placed or performed during the hospital encounter of 07/05/16 (from the past 24 hour(s))  Glucose, capillary     Status: Abnormal   Collection Time: 07/07/16  4:30 PM  Result Value Ref Range   Glucose-Capillary 152 (H) 65 - 99 mg/dL   Comment 1 Notify RN   Glucose, capillary     Status: Abnormal   Collection Time: 07/07/16  9:58 PM  Result Value Ref Range   Glucose-Capillary 126 (H) 65 - 99 mg/dL  Basic metabolic panel     Status: Abnormal   Collection Time: 07/08/16  3:45 AM  Result Value Ref Range   Sodium 138 135 - 145 mmol/L   Potassium 3.7 3.5 - 5.1 mmol/L   Chloride 104 101 - 111 mmol/L   CO2 27 22 - 32 mmol/L   Glucose, Bld 158 (H) 65 - 99 mg/dL   BUN 15 6 - 20 mg/dL   Creatinine, Ser 0.87 0.44  - 1.00 mg/dL   Calcium 9.1 8.9 - 10.3 mg/dL   GFR calc non Af Amer >60 >60 mL/min   GFR calc Af Amer >60 >60 mL/min   Anion gap 7 5 - 15  Glucose, capillary     Status: Abnormal   Collection Time: 07/08/16  6:44 AM  Result Value Ref Range   Glucose-Capillary 148 (H) 65 - 99 mg/dL   Lab Results  Component Value Date   HGBA1C 8.4 (H) 07/06/2016    Studies/Results:  Medications: I have reviewed the patient's current medications. Scheduled Meds: . amLODipine  10 mg Oral Daily  . aspirin  325 mg Oral Q breakfast  . atorvastatin  80 mg Oral Daily  . cholecalciferol  1,000 Units Oral Daily  . citalopram  20 mg Oral Daily  . clopidogrel  75 mg Oral Q breakfast  . docusate sodium  100 mg Oral BID  . enoxaparin (LOVENOX) injection  40 mg Subcutaneous Q24H  . fluticasone  1 spray Each Nare Daily  . furosemide  80 mg  Intravenous BID  . insulin aspart  0-15 Units Subcutaneous TID WC  . insulin glargine  15 Units Subcutaneous BID  . isosorbide mononitrate  60 mg Oral Daily  . living well with diabetes book   Does not apply Once  . loratadine  10 mg Oral Daily  . losartan  100 mg Oral Daily  . metoprolol tartrate  50 mg Oral BID  . pantoprazole  40 mg Oral Daily  . polyethylene glycol  17 g Oral BID  . potassium chloride SA  20 mEq Oral Daily  . pregabalin  50 mg Oral TID  . ranolazine  500 mg Oral BID   Continuous Infusions: PRN Meds:.acetaminophen, albuterol, nitroGLYCERIN   Assessment/Plan: Active Problems:   HTN (hypertension)   Type 2 diabetes mellitus without complication, without long-term current use of insulin (HCC)   Acute diastolic CHF (congestive heart failure) (HCC)   PAD (peripheral artery disease) (HCC)   Chest pain   Essential hypertension   Acute on chronic diastolic heart failure (HCC)   Ulcer of right lower extremity (HCC)  [name] is a 75 y.o. female with 1 week of atypical chest pain and worsening lower extremity edema most likely consistent with  demand ischemia and acute HF in the setting of uncontrolled hypertension.  Chest pain:  CAD is the most likely the underlying etiology of the patient's chest pain given her multiple risk factors and history. Mechanistically, her chest pain is probably due to demand ischemia in the setting of increased afterload from uncontrolled hypertension. SPECT in 01/2016 showed possibility of minimal myocardial ischemia; however the quality of the study was poor.  - Tn 0.07 on admission; peaked at 0.09 on 12/2 - Per cardiology stress test and coronary angiography would not change management. She undoubtedly has CAD. She is not a good candidate for catheterization. Will manage medically and optimize medical therapy - continue home Aspirin, Plavix - increase atorvastatin to 80 mg daily - add isosorbide mononitrate, sublingual nitro, ranolazine per cards  HFpEF: Echo (03/02/16): EF 55-60%, mild aortic stenosis, grade 2 diastolic dysfunction. Still fluid up today. Swelling has decreased in her hands. May have some venous insufficiency. - continue IV lasix 80mg  BID - monitor BUN, HCO3- - replete K, recheck Mg  Hypertension, widened pulse pressure: In the ED, BP was noted to be 195/54. Today 122/47. Pulse pressure still noted to be widened. No evidence of aortic regurgitation on exam. May be related to age and patient's existing atherosclerotic disease. Per UpToDate, stiffening of the aorta with age reduces the elastic reservoir capacity, thus more blood runs off from each stroke volume during systole, resulting in a reduced blood volume within the aorta at the onset of diastole. When combined with diminished elastic recoil and an increased pressure decay rate, causes a fall in the diastolic pressure with age. - Continue home meds: amlodipine 10mg , losartan 100mg , metoprolol tartrate 50 mg BID - Continue management of existing chronic conditions including htn, hld, DM to prevent worsening of atherosclerosis.    Non-healing lower extremity wounds: Unlikely osteomyelitis given that she is afebrile w/out leukocytosis and no signs of infection. Radiographic findings negative. Wound likely not healing due to poor perfusion from patients PAD and/or lower extremity edema - Wound care consult - Continue to monitor for fever, drainage, and developing leukocytosis - Wlll refer to Dr. Sharol Given for management (possile amputation)  DM: Last A1c 7.7 (03/02/2016) - Repeat A1c- 8.4; Sugars well controlled during hospitalization. Will need PCP follow-up for medication optimization. -  Continue home medication: Lantus 15U qHS, Aspart 12U qAS - SSI  Diabetic neuropathy: Still complains of neuropathic pain, may be secondary to history of CVA - Citalopram 20 mg daily - Increase Pregabalin to 75 TID  GERD: - pantoprazole 40mg  daily  FEN/GI:  - Vit D, KCl tablets - Miralax 17g BID - Heart healthy diet  DVT ppx: Lovenox 40mg  IV  Code: Full code  This is a Careers information officer Note.  The care of the patient was discussed with Dr. Heber Storrs and the assessment and plan formulated with his assistance.  Please see his attached note for official documentation of the daily encounter.   LOS: 2 days   Armen Pickup, Medical Student 07/08/2016, 10:48 AM

## 2016-07-08 NOTE — Progress Notes (Signed)
Subjective: Currently, the patient is still complaining of multiple paraesthesias and sharp, burning pains in BUE, unchanged from admission. Also w/ continued chest "heaviness" minor improvement today. Suspect inaccurate bed wts, will follow clinical exam.  Interval Events: Cards to add Ranexa to Imdur and SL Nitro for symptomatic support.  Objective: Vital signs in last 24 hours: Vitals:   07/07/16 1350 07/07/16 2201 07/08/16 0246 07/08/16 0414  BP: 99/86 (!) 122/39  (!) 123/41  Pulse: 70 73  69  Resp: 18 18  18   Temp: 97.6 F (36.4 C) 97.7 F (36.5 C)  97.9 F (36.6 C)  TempSrc: Oral Oral  Oral  SpO2: 94% 93%  95%  Weight:   215 lb (97.5 kg)   Height:       24-hour weight change: Filed Weights   07/05/16 1045 07/07/16 0451 07/08/16 0246  Weight: 210 lb (95.3 kg) 218 lb 6.4 oz (99.1 kg) 215 lb (97.5 kg)   Dry Wt: ~200 lbs in June 2017.  Intake/Output:  12/03 0701 - 12/04 0700 In: 840 [P.O.:840] Out: -     Physical Exam: Physical Exam  Constitutional: She appears well-developed. She is cooperative. No distress.  Cardiovascular: Normal rate, regular rhythm, S1 normal and S2 normal.  Exam reveals no gallop.   Murmur heard.  Systolic murmur is present with a grade of 3/6  Pulses:      Radial pulses are 2+ on the right side, and 2+ on the left side.       Dorsalis pedis pulses are 1+ on the right side, and 1+ on the left side.  Pulmonary/Chest: Effort normal and breath sounds normal. No respiratory distress. Breasts are symmetrical.  Abdominal: Soft. Bowel sounds are normal. There is no tenderness.  Musculoskeletal: She exhibits edema (2+ BLE to the mid shin) and tenderness (TTP RLE).  Bandaged R medial calf wound, c/d/i. Bandaged R MTP wound, c/d/i.   Labs: CBC:  Recent Labs Lab 07/01/16 1704 07/05/16 1129  WBC 10.0 10.0  NEUTROABS 5.6  --   HGB 12.4 12.0  HCT 38.6 38.0  MCV 83.6 84.1  PLT [id number]   Metabolic Panel:  Recent Labs Lab 07/01/16 1704  07/05/16 1129 07/08/16 0345  NA 139 140 138  K 4.2 4.5 3.7  CL 102 106 104  CO2 27 24 27   GLUCOSE 99 206* 158*  BUN 22* 18 15  CREATININE 0.81 0.74 0.87  CALCIUM 9.4 9.6 9.1  ALT 29  --   --   ALKPHOS 62  --   --   BILITOT 0.4  --   --   PROT 7.0  --   --   ALBUMIN 3.7  --   --    Cardiac Labs:  Recent Labs Lab 07/05/16 1155  07/05/16 2345 07/06/16 0537 07/06/16 1326 07/06/16 1842 07/07/16 0045  TROPIPOC 0.07  --   --   --   --   --   --   TROPONINI  --   < > 0.04* 0.09* 0.05* 0.04* 0.05*  < > = values in this interval not displayed. BG:  Recent Labs Lab 07/07/16 0636 07/07/16 1058 07/07/16 1630 07/07/16 2158 07/08/16 0644  GLUCAP 109* 254* 152* 126* 148*     Lab Results  Component Value Date   HGBA1C 8.4 (H) 07/06/2016    Imaging: RLE XR w/o signs of osteo. CXR patchy infiltrates, improved from prior.   Medications: Scheduled Medications: . amLODipine  10 mg Oral Daily  . aspirin  325 mg Oral Q breakfast  . atorvastatin  80 mg Oral Daily  . cholecalciferol  1,000 Units Oral Daily  . citalopram  20 mg Oral Daily  . clopidogrel  75 mg Oral Q breakfast  . docusate sodium  100 mg Oral BID  . enoxaparin (LOVENOX) injection  40 mg Subcutaneous Q24H  . fluticasone  1 spray Each Nare Daily  . furosemide  80 mg Intravenous BID  . insulin aspart  0-15 Units Subcutaneous TID WC  . insulin glargine  15 Units Subcutaneous BID  . isosorbide mononitrate  60 mg Oral Daily  . living well with diabetes book   Does not apply Once  . loratadine  10 mg Oral Daily  . losartan  100 mg Oral Daily  . metoprolol tartrate  50 mg Oral BID  . pantoprazole  40 mg Oral Daily  . polyethylene glycol  17 g Oral BID  . potassium chloride SA  20 mEq Oral Daily  . pregabalin  50 mg Oral TID  . ranolazine  500 mg Oral BID   PRN Medications: acetaminophen, albuterol, nitroGLYCERIN  Assessment/Plan: [name] is a 75 y.o. 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  1) CP: Typical CP w/ small Trop bump. Suspect demand ischemia 2/2 decompensation of HF and HTN. Likely also small NSTEMI. Per Cards, not a cath candidate, optimize medical management. - cont diuresis as below - Continue atorva 80mg  - Ranexa, Imdur, SL nitro PRN angina - continue home meds: ASA, plavix  2) HFpEF: Echo in July w/ EF of 55-60% and grade 2 diastolic dysfunction w/ significant LVH. Still w/ some peripheral edema, lung CTAB. Likely require 1 more day of IV diuresis. SCr stable. - 80mg  IV Lasix BID  3) Non-healing leg wounds: Stable. Per [facility]VS, plans to refer to Dr. Sharol Given. - WOC c/s  4) L ICA occlusion / parasthesias: Neuro exam stable. Persistent deficits and complaints of paraesthesias. Would like f/u scheduled w/ Dr. Erlinda Hong for continued evaluation following her CVA, but she has not been able to accomplish this. - Increase pregabalin to 100mg  TID - citalopram 20mg  qD  5) HTN: Controlled. Continue home meds: amlodipine 10mg  and losartan 100mg .  6) DM: Last A1c 7.7. Home meds: Lantus 15U qHS, Aspart 12U qAC. Continue home meds + SSI.  Length of Stay: 2 day(s) Dispo: Anticipated discharge in approximately 1 day(s).  Holley Raring, MD Pager: [phone] (7AM-5PM) 07/08/2016, 11:26 AM

## 2016-07-08 NOTE — Clinical Social Work Note (Signed)
Clinical Social Work Assessment  Patient Details  Name: [name] MRN: [id number] Date of Birth: 08-Oct-1940  Date of referral:  07/08/16               Reason for consult:  Discharge Planning                Permission sought to share information with:  Family Supports Permission granted to share information::  Yes, Verbal Permission Granted  Name::     Akita Maxim  Agency::     Relationship::  Spouse  Contact Information:  [phone]  Housing/Transportation Living arrangements for the past 2 months:  [place] of Information:  Patient Patient Interpreter Needed:  None Criminal Activity/Legal Involvement Pertinent to Current Situation/Hospitalization:  No - Comment as needed Significant Relationships:  None Lives with:  Spouse Do you feel safe going back to the place where you live?  Yes Need for family participation in patient care:  Yes (Comment)  Care giving concerns:  Patient states she lives at home with husband and has adult children, grandchildren and great grandchildren    Facilities manager / plan:  Holiday representative met with patient at bedside to offer support and discuss patient needs at discharge. Patient stated she lives at home with her husband. Patient states before being admitted at [facility] she has been released from a skilled nursing facility. Patient could not remember what the name of the facility was called. Patient was not sure if she wanted to go back to back to a SNF placement. Patient wanted CSW to contact family and have them decide if patient should go to a skilled nursing facility. CSW contacted husband first but Mr. Ramp did not answer. CSW was able to get in contact with patient' daughter [name]). [name] stated she will contact her father and discuss with him if they want the patient to go to SNF because they are concerned with the insurance coverage.   Employment status:  Other (Comment) Insurance information:  Marketing executive) PT Recommendations:  [place] / Referral to community resources:  [place]  Patient/Family's Response to care:  Patient verbalized appreciation and understanding for CSW role and involvement in care. Patient's family will notify CSW of their discharge plan  Patient/Family's Understanding of and Emotional Response to Diagnosis, Current Treatment, and Prognosis:  Patient has good understanding of current medical state and limitations around most recent hospitalization.  Emotional Assessment Appearance:  Appears stated age Attitude/Demeanor/Rapport:  Other (Attitude/demoanor appropriate ) Affect (typically observed):  Pleasant Orientation:  Oriented to Self, Oriented to Place, Oriented to  Time, Oriented to Situation Alcohol / Substance use:  Not Applicable Psych involvement (Current and /or in the community):  No (Comment)  Discharge Needs  Concerns to be addressed:  No discharge needs identified Readmission within the last 30 days:  No Current discharge risk:  None Barriers to Discharge:  Barriers Resolved   Rhea Pink, MSW,  [place]

## 2016-07-08 NOTE — Clinical Social Work Placement (Signed)
   CLINICAL SOCIAL WORK PLACEMENT  NOTE  Date:  07/08/2016  Patient Details  Name: [name] MRN: [id number] Date of Birth: 01-18-1941  Clinical Social Work is seeking post-discharge placement for this patient at the [place] level of care (*CSW will initial, date and re-position this form in  chart as items are completed):  Yes   Patient/family provided with [place] Work Department's list of facilities offering this level of care within the geographic area requested by the patient (or if unable, by the patient's family).  Yes   Patient/family informed of their freedom to choose among providers that offer the needed level of care, that participate in Medicare, Medicaid or managed care program needed by the patient, have an available bed and are willing to accept the patient.  Yes   Patient/family informed of [facility]'s ownership interest in [facility] and [facility], as well as of the fact that they are under no obligation to receive care at these facilities.  PASRR submitted to EDS on       PASRR number received on       Existing PASRR number confirmed on 07/08/16     FL2 transmitted to all facilities in geographic area requested by pt/family on       FL2 transmitted to all facilities within larger geographic area on       Patient informed that his/her managed care company has contracts with or will negotiate with certain facilities, including the following:            Patient/family informed of bed offers received.  Patient chooses bed at       Physician recommends and patient chooses bed at      Patient to be transferred to   on  .  Patient to be transferred to facility by       Patient family notified on   of transfer.  Name of family member notified:        PHYSICIAN Please sign FL2     Additional Comment:    _______________________________________________ Rhea Pink, MSW,  [place] [phone]

## 2016-07-08 NOTE — Evaluation (Signed)
Physical Therapy Evaluation Patient Details Name: [name] MRN: [id number] DOB: 1940-11-21 Today's Date: 07/08/2016   History of Present Illness  75 YO F admitted 07/05/16 for chest pain and R foot and R lower leg wound from 03/01/16 popliteal to peroneal artery bypass procedure. PMH includes HTN, DM, diastolic acute CHF, PAD, R LE ulcer, CVA, aortic stenosis, neuropathy, edema.   Clinical Impression  Patient has decreased strength, decreased mobility, and decreased activity tolerance and would benefit from physical therapy to decrease burden on caregiver and to maximize function in the home. Patient was impatient and demanding with PT throughout the session. Patient's linens were soiled with urine upon arrival because patient did not call for assistance. During treatment, the patient had one episode of urinary incontinence. She required max assist in bed mobility for cleaning. Patient states that her husband is responsible for bed mobility, transfers, and moving her within the home, which is concerning considering the patient was a +2 for physical assistance for all bed mobility. PT was told that SNF would be appropriate for next step, or looking into a hoyer lift for the patient's home to decrease burden of care on husband. Will continue to follow.     Follow Up Recommendations Supervision/Assistance - 24 hour;SNF    Equipment Recommendations  Other (comment) (Hoyer lift if patient returns home )    Recommendations for Other Services       Precautions / Restrictions Precautions Precautions: Fall Precaution Comments: incontinent  Restrictions Weight Bearing Restrictions: No      Mobility  Bed Mobility Overal bed mobility: Needs Assistance;+2 for physical assistance Bed Mobility: Rolling;Supine to Sit;Sit to Supine Rolling: +2 for physical assistance;Max assist   Supine to sit: Max assist;HOB elevated;+2 for physical assistance Sit to supine: Max assist;+2 for physical  assistance   General bed mobility comments: patient required cuing for hand placement with rolling and required significant encouragement to physically assist PT. PT had to bend knee and rotate pelvis to perform bed mobility.   Transfers Overall transfer level: Needs assistance   Transfers: Sit to/from Stand Sit to Stand: +2 physical assistance;Min assist         General transfer comment: Once sitting, patient was able to rise with +2 min assist. Patient stood once and stood for a few seconds, and stood a second time to take 2 steps towards [facility] for return to bed. Patient required knee blocking for buckling, and noted feeling unsteady. She used hands to grab onto environment and PT during the transfer.  Ambulation/Gait                Stairs            Wheelchair Mobility    Modified Rankin (Stroke Patients Only)       Balance Overall balance assessment: Needs assistance Sitting-balance support: Bilateral upper extremity supported;Feet supported Sitting balance-Leahy Scale: Poor Sitting balance - Comments: Patient cued to sit up on own, but was only able to sustain for a few seconds before leaning backwards.  Postural control: Posterior lean Standing balance support: Bilateral upper extremity supported;During functional activity Standing balance-Leahy Scale: Poor Standing balance comment: Patient required +2 knee guarding and trunk/arm support.                              Pertinent Vitals/Pain Pain Assessment: 0-10 Pain Score: 4  Pain Location: R arm and R LE     Home Living Family/patient expects  to be discharged to:: Private residence Living Arrangements: Spouse/significant other Available Help at Discharge: Family Type of Home: House Home Access: [place]: [place]: Environmental consultant - 2 wheels;Wheelchair - manual;Hospital bed;Bedside commode;Grab bars - toilet;Toilet riser;Shower seat      Prior Function  Level of Independence: Needs assistance   Gait / Transfers Assistance Needed: walking with RW for short distance and WC for longer distances  ADL's / Homemaking Assistance Needed: aide for bathing/ dressing        Hand Dominance        Extremity/Trunk Assessment   Upper Extremity Assessment: Generalized weakness           Lower Extremity Assessment: Generalized weakness         Communication   Communication: No difficulties  Cognition Arousal/Alertness: Awake/alert Behavior During Therapy: Flat affect Overall Cognitive Status: Within Functional Limits for tasks assessed                      General Comments      Exercises     Assessment/Plan    PT Assessment Patient needs continued PT services  PT Problem List Decreased strength;Decreased mobility;Decreased range of motion;Decreased activity tolerance;Decreased balance;Decreased coordination;Decreased knowledge of use of DME;Impaired sensation;Decreased skin integrity;Obesity;Pain          PT Treatment Interventions Functional mobility training;Balance training;Therapeutic exercise;Therapeutic activities;DME instruction;Patient/family education    PT Goals (Current goals can be found in the Care Plan section)  Acute Rehab PT Goals Patient Stated Goal: return home  PT Goal Formulation: With patient Time For Goal Achievement: 07/22/16 Potential to Achieve Goals: Fair    Frequency Min 2X/week   Barriers to discharge        Co-evaluation               End of Session Equipment Utilized During Treatment: Gait belt Activity Tolerance: Patient limited by lethargy;Patient limited by fatigue Patient left: in bed;with call bell/phone within reach Nurse Communication: Mobility status;Need for lift equipment         Time: [id number] PT Time Calculation (min) (ACUTE ONLY): 34 min   Charges:   PT Evaluation $PT Eval Moderate Complexity: 1 Procedure PT Treatments $Therapeutic Activity:  8-22 mins   PT G Codes:        Quintella Mura 2016/07/14, 2:07 PM  [place] SPT [id number]

## 2016-07-08 NOTE — Progress Notes (Signed)
Internal Medicine Attending:   I saw and examined the patient. I reviewed the resident's note and I agree with the resident's findings and plan as documented in the resident's note. We suspect her I/O and weights are inaccurate.  She reports as least 7 occurrences of urine output last night yet still has significant pedal edema 2+ to about the knee.  Otherwise Lungs are CTA and she does not have any supplemental O2 requirement.  She is complaining about some generalized aches and pains that are mostly chronic but worsened by diuresis, I suspect this may be contributed to by fluid/electrolyte shifts and there will treat for potassium goal of 4 and magnesium goal of 2. We will continue IV diuresis today. We expect discharge in 1-2 days.

## 2016-07-08 NOTE — NC FL2 (Signed)
[place] MEDICAID FL2 LEVEL OF CARE SCREENING TOOL     IDENTIFICATION  Patient Name: [name] Birthdate: 1940-09-15 Sex: female Admission Date (Current Location): 07/05/2016  [facility] and [place] Number:  Herbalist and Address:  [facility] [place], [place] [address], [place], [place] [address]      Provider Number: [id number]  Attending Physician Name and Address:  Lucious Groves, DO  Relative Name and Phone Number:       Current Level of Care: Hospital Recommended Level of Care: [place] Prior Approval Number:    Date Approved/Denied:   PASRR Number: [id number]  Discharge Plan: SNF    Current Diagnoses: Patient Active Problem List   Diagnosis Date Noted  . Essential hypertension   . Acute on chronic diastolic heart failure ([place])   . Ulcer of right lower extremity ([place])   . Chest pain 07/05/2016  . Pleural effusion, bilateral   . Pulmonary congestion   . Wheeze   . Dyspnea   . CHF (congestive heart failure) ([place])   . Right sided weakness   . Acute respiratory acidosis   . Surgery, elective   . Anemia, iron deficiency   . Leukocytosis   . Cerebrovascular accident (CVA) due to thrombosis of precerebral artery ([place]) 03/01/2016  . 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 Acute respiratory failure ([place])   . PAD (peripheral artery disease) ([place]) 02/28/2016  . Atherosclerosis of native arteries of the extremities with ulceration ([place]) 12/08/2015  . Open toe wound 05/04/2014  . Anemia, chronic disease 04/27/2014  . Occult blood positive stool 04/17/2014  . Edema 04/03/2014  . Unspecified hereditary and idiopathic peripheral neuropathy 03/14/2014  . Acute and chronic respiratory failure 02/14/2014  . Type II or unspecified type diabetes mellitus with neurological manifestations, uncontrolled(250.62) 02/14/2014  . Respiratory failure ([place]) 02/12/2014  . Altered mental status  02/12/2014  . Acute encephalopathy 02/12/2014  . Acute respiratory failure with hypercapnia ([place]) 02/12/2014  . Aortic stenosis 02/04/2014  . Closed left subtrochanteric femur fracture ([place]) 02/03/2014  . Hip fracture, left ([place]) 02/03/2014  . Hypokalemia 02/03/2014  . Fibula fracture 02/03/2014  . MTP instability 02/03/2014  . Hip fracture ([place]) 02/03/2014  . HTN (hypertension)   . Type 2 diabetes mellitus without complication, without long-term current use of insulin ([place])   . HLD (hyperlipidemia)     Orientation RESPIRATION BLADDER Height & Weight     Self, Time, Situation, Place  Normal Incontinent Weight: 215 lb (97.5 kg) Height:  5\' 4"  (162.6 cm)  BEHAVIORAL SYMPTOMS/MOOD NEUROLOGICAL BOWEL NUTRITION STATUS      Incontinent Diet (Heart healthy)  AMBULATORY STATUS COMMUNICATION OF NEEDS Skin   Extensive Assist Verbally Normal                       Personal Care Assistance Level of Assistance  Bathing, Feeding, Dressing Bathing Assistance: Limited assistance Feeding assistance: Limited assistance Dressing Assistance: Limited assistance     Functional Limitations Info  Sight, Hearing, Speech Sight Info: Adequate Hearing Info: Impaired Speech Info: Adequate    SPECIAL CARE FACTORS FREQUENCY  PT (By licensed PT), OT (By licensed OT)     PT Frequency: 5x week OT Frequency: 5x week            Contractures Contractures Info: Not present    Additional Factors Info  Code Status Code Status Info: Full Code  Current Medications (07/08/2016):  This is the current hospital active medication list Current Facility-Administered Medications  Medication Dose Route Frequency Provider Last Rate Last Dose  . acetaminophen (TYLENOL) tablet 1,000 mg  1,000 mg Oral Q6H PRN Holley Raring, MD   1,000 mg at 07/08/16 1705  . albuterol (PROVENTIL) (2.5 MG/3ML) 0.083% nebulizer solution 2.5 mg  2.5 mg Inhalation Q6H PRN Shela Leff, MD      . amLODipine  (NORVASC) tablet 10 mg  10 mg Oral Daily Shela Leff, MD   10 mg at 07/08/16 1015  . aspirin tablet 325 mg  325 mg Oral Q breakfast Shela Leff, MD   325 mg at 07/08/16 0612  . atorvastatin (LIPITOR) tablet 80 mg  80 mg Oral Daily Adrian Prows, MD   80 mg at 07/08/16 1015  . cholecalciferol (VITAMIN D) tablet 1,000 Units  1,000 Units Oral Daily Shela Leff, MD   1,000 Units at 07/08/16 1010  . citalopram (CELEXA) tablet 20 mg  20 mg Oral Daily Shela Leff, MD   20 mg at 07/08/16 1014  . clopidogrel (PLAVIX) tablet 75 mg  75 mg Oral Q breakfast Shela Leff, MD   75 mg at 07/08/16 0612  . docusate sodium (COLACE) capsule 100 mg  100 mg Oral BID Shela Leff, MD   100 mg at 07/08/16 1012  . enoxaparin (LOVENOX) injection 40 mg  40 mg Subcutaneous Q24H Shela Leff, MD   40 mg at 07/07/16 2330  . fluticasone (FLONASE) 50 MCG/ACT nasal spray 1 spray  1 spray Each Nare Daily Shela Leff, MD   1 spray at 07/08/16 1016  . furosemide (LASIX) injection 80 mg  80 mg Intravenous BID Holley Raring, MD   80 mg at 07/08/16 1705  . insulin aspart (novoLOG) injection 0-15 Units  0-15 Units Subcutaneous TID WC Shela Leff, MD   3 Units at 07/08/16 1157  . insulin glargine (LANTUS) injection 15 Units  15 Units Subcutaneous BID Shela Leff, MD   15 Units at 07/08/16 1009  . isosorbide mononitrate (IMDUR) 24 hr tablet 60 mg  60 mg Oral Daily Adrian Prows, MD   60 mg at 07/08/16 1015  . living well with diabetes book MISC   Does not apply Once Oval Linsey, MD      . loratadine (CLARITIN) tablet 10 mg  10 mg Oral Daily Shela Leff, MD   10 mg at 07/08/16 1015  . losartan (COZAAR) tablet 100 mg  100 mg Oral Daily Oval Linsey, MD   100 mg at 07/08/16 1009  . metoprolol (LOPRESSOR) tablet 50 mg  50 mg Oral BID Oval Linsey, MD   50 mg at 07/08/16 1014  . nitroGLYCERIN (NITROSTAT) SL tablet 0.4 mg  0.4 mg Sublingual Q5 min PRN Adrian Prows, MD      . pantoprazole  (PROTONIX) EC tablet 40 mg  40 mg Oral Daily Shela Leff, MD   40 mg at 07/08/16 1009  . polyethylene glycol (MIRALAX / GLYCOLAX) packet 17 g  17 g Oral BID Shela Leff, MD   17 g at 07/08/16 1011  . potassium chloride SA (K-DUR,KLOR-CON) CR tablet 20 mEq  20 mEq Oral Daily Shela Leff, MD   20 mEq at 07/08/16 1014  . pregabalin (LYRICA) capsule 100 mg  100 mg Oral TID Holley Raring, MD   100 mg at 07/08/16 1705  . ranolazine (RANEXA) 12 hr tablet 500 mg  500 mg Oral BID Adrian Prows, MD   500 mg at 07/08/16  1015     Discharge Medications: Please see discharge summary for a list of discharge medications.  Relevant Imaging Results:  Relevant Lab Results:   Additional Information SS# [id number]  Wende Neighbors, LCSW

## 2016-07-08 NOTE — NC FL2 (Signed)
[place] MEDICAID FL2 LEVEL OF CARE SCREENING TOOL     IDENTIFICATION  Patient Name: [name] Birthdate: 10/18/1940 Sex: female Admission Date (Current Location): 07/05/2016  [facility] [place] Number:  Herbalist and Address:  [facility] [place], [place] [address], [place], [place] [address]      Provider Number: [id number]  Attending Physician Name and Address:  Lucious Groves, DO  Relative Name and Phone Number:       Current Level of Care: Hospital Recommended Level of Care: [place] Prior Approval Number:    Date Approved/Denied:   PASRR Number: [id number]  Discharge Plan: SNF    Current Diagnoses: Patient Active Problem List   Diagnosis Date Noted  . Essential hypertension   . Acute on chronic diastolic heart failure ([place])   . Ulcer of right lower extremity ([place])   . Chest pain 07/05/2016  . Pleural effusion, bilateral   . Pulmonary congestion   . Wheeze   . Dyspnea   . CHF (congestive heart failure) ([place])   . Right sided weakness   . Acute respiratory acidosis   . Surgery, elective   . Anemia, iron deficiency   . Leukocytosis   . Cerebrovascular accident (CVA) due to thrombosis of precerebral artery ([place]) 03/01/2016  . 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 Acute respiratory failure ([place])   . PAD (peripheral artery disease) ([place]) 02/28/2016  . Atherosclerosis of native arteries of the extremities with ulceration ([place]) 12/08/2015  . Open toe wound 05/04/2014  . Anemia, chronic disease 04/27/2014  . Occult blood positive stool 04/17/2014  . Edema 04/03/2014  . Unspecified hereditary and idiopathic peripheral neuropathy 03/14/2014  . Acute and chronic respiratory failure 02/14/2014  . Type II or unspecified type diabetes mellitus with neurological manifestations, uncontrolled(250.62) 02/14/2014  . Respiratory failure ([place]) 02/12/2014  . Altered mental status  02/12/2014  . Acute encephalopathy 02/12/2014  . Acute respiratory failure with hypercapnia ([place]) 02/12/2014  . Aortic stenosis 02/04/2014  . Closed left subtrochanteric femur fracture ([place]) 02/03/2014  . Hip fracture, left ([place]) 02/03/2014  . Hypokalemia 02/03/2014  . Fibula fracture 02/03/2014  . MTP instability 02/03/2014  . Hip fracture ([place]) 02/03/2014  . HTN (hypertension)   . Type 2 diabetes mellitus without complication, without long-term current use of insulin ([place])   . HLD (hyperlipidemia)     Orientation RESPIRATION BLADDER Height & Weight     Self, Time, Situation, Place  Normal Incontinent Weight: 215 lb (97.5 kg) Height:  5\' 4"  (162.6 cm)  BEHAVIORAL SYMPTOMS/MOOD NEUROLOGICAL BOWEL NUTRITION STATUS      Incontinent Diet (Heart healthy)  AMBULATORY STATUS COMMUNICATION OF NEEDS Skin   Extensive Assist Verbally Normal                       Personal Care Assistance Level of Assistance  Bathing, Feeding, Dressing Bathing Assistance: Limited assistance Feeding assistance: Limited assistance Dressing Assistance: Limited assistance     Functional Limitations Info  Sight, Hearing, Speech Sight Info: Adequate Hearing Info: Impaired Speech Info: Adequate    SPECIAL CARE FACTORS FREQUENCY  PT (By licensed PT), OT (By licensed OT)     PT Frequency: 5x week OT Frequency: 5x week            Contractures Contractures Info: Not present    Additional Factors Info  Code Status Code Status Info: Full Code  Current Medications (07/08/2016):  This is the current hospital active medication list Current Facility-Administered Medications  Medication Dose Route Frequency Provider Last Rate Last Dose  . acetaminophen (TYLENOL) tablet 1,000 mg  1,000 mg Oral Q6H PRN Holley Raring, MD   1,000 mg at 07/08/16 1705  . albuterol (PROVENTIL) (2.5 MG/3ML) 0.083% nebulizer solution 2.5 mg  2.5 mg Inhalation Q6H PRN Shela Leff, MD      . amLODipine  (NORVASC) tablet 10 mg  10 mg Oral Daily Shela Leff, MD   10 mg at 07/08/16 1015  . aspirin tablet 325 mg  325 mg Oral Q breakfast Shela Leff, MD   325 mg at 07/08/16 0612  . atorvastatin (LIPITOR) tablet 80 mg  80 mg Oral Daily Adrian Prows, MD   80 mg at 07/08/16 1015  . cholecalciferol (VITAMIN D) tablet 1,000 Units  1,000 Units Oral Daily Shela Leff, MD   1,000 Units at 07/08/16 1010  . citalopram (CELEXA) tablet 20 mg  20 mg Oral Daily Shela Leff, MD   20 mg at 07/08/16 1014  . clopidogrel (PLAVIX) tablet 75 mg  75 mg Oral Q breakfast Shela Leff, MD   75 mg at 07/08/16 0612  . docusate sodium (COLACE) capsule 100 mg  100 mg Oral BID Shela Leff, MD   100 mg at 07/08/16 1012  . enoxaparin (LOVENOX) injection 40 mg  40 mg Subcutaneous Q24H Shela Leff, MD   40 mg at 07/07/16 2330  . fluticasone (FLONASE) 50 MCG/ACT nasal spray 1 spray  1 spray Each Nare Daily Shela Leff, MD   1 spray at 07/08/16 1016  . furosemide (LASIX) injection 80 mg  80 mg Intravenous BID Holley Raring, MD   80 mg at 07/08/16 1705  . insulin aspart (novoLOG) injection 0-15 Units  0-15 Units Subcutaneous TID WC Shela Leff, MD   3 Units at 07/08/16 1157  . insulin glargine (LANTUS) injection 15 Units  15 Units Subcutaneous BID Shela Leff, MD   15 Units at 07/08/16 1009  . isosorbide mononitrate (IMDUR) 24 hr tablet 60 mg  60 mg Oral Daily Adrian Prows, MD   60 mg at 07/08/16 1015  . living well with diabetes book MISC   Does not apply Once Oval Linsey, MD      . loratadine (CLARITIN) tablet 10 mg  10 mg Oral Daily Shela Leff, MD   10 mg at 07/08/16 1015  . losartan (COZAAR) tablet 100 mg  100 mg Oral Daily Oval Linsey, MD   100 mg at 07/08/16 1009  . metoprolol (LOPRESSOR) tablet 50 mg  50 mg Oral BID Oval Linsey, MD   50 mg at 07/08/16 1014  . nitroGLYCERIN (NITROSTAT) SL tablet 0.4 mg  0.4 mg Sublingual Q5 min PRN Adrian Prows, MD      . pantoprazole  (PROTONIX) EC tablet 40 mg  40 mg Oral Daily Shela Leff, MD   40 mg at 07/08/16 1009  . polyethylene glycol (MIRALAX / GLYCOLAX) packet 17 g  17 g Oral BID Shela Leff, MD   17 g at 07/08/16 1011  . potassium chloride SA (K-DUR,KLOR-CON) CR tablet 20 mEq  20 mEq Oral Daily Shela Leff, MD   20 mEq at 07/08/16 1014  . pregabalin (LYRICA) capsule 100 mg  100 mg Oral TID Holley Raring, MD   100 mg at 07/08/16 1705  . ranolazine (RANEXA) 12 hr tablet 500 mg  500 mg Oral BID Adrian Prows, MD   500 mg at 07/08/16  1015     Discharge Medications: Please see discharge summary for a list of discharge medications.  Relevant Imaging Results:  Relevant Lab Results:   Additional Information SS# [id number]  Wende Neighbors, LCSW

## 2016-07-09 ENCOUNTER — Encounter (HOSPITAL_COMMUNITY): Payer: Self-pay | Admitting: General Practice

## 2016-07-09 LAB — BASIC METABOLIC PANEL
ANION GAP: 7 (ref 5–15)
BUN: 21 mg/dL — ABNORMAL HIGH (ref 6–20)
CALCIUM: 9.5 mg/dL (ref 8.9–10.3)
CO2: 28 mmol/L (ref 22–32)
Chloride: 104 mmol/L (ref 101–111)
Creatinine, Ser: 1.01 mg/dL — ABNORMAL HIGH (ref 0.44–1.00)
GFR, EST NON AFRICAN AMERICAN: 54 mL/min — AB (ref >60)
Glucose, Bld: 154 mg/dL — ABNORMAL HIGH (ref 65–99)
POTASSIUM: 4 mmol/L (ref 3.5–5.1)
SODIUM: 139 mmol/L (ref 135–145)

## 2016-07-09 LAB — GLUCOSE, CAPILLARY
GLUCOSE-CAPILLARY: 149 mg/dL — AB (ref 65–99)
GLUCOSE-CAPILLARY: 176 mg/dL — AB (ref 65–99)
GLUCOSE-CAPILLARY: 212 mg/dL — AB (ref 65–99)

## 2016-07-09 LAB — MAGNESIUM: MAGNESIUM: 2.2 mg/dL (ref 1.7–2.4)

## 2016-07-09 MED ORDER — PRO-STAT SUGAR FREE PO LIQD
30.0000 mL | Freq: Two times a day (BID) | ORAL | Status: DC
  Administered 2016-07-09: 30 mL via ORAL
  Filled 2016-07-09: qty 30

## 2016-07-09 MED ORDER — METOPROLOL TARTRATE 50 MG PO TABS: 50.0000 mg | ORAL_TABLET | Freq: Two times a day (BID) | ORAL | 0 refills | Status: DC

## 2016-07-09 MED ORDER — RANOLAZINE ER 500 MG PO TB12: 500.0000 mg | ORAL_TABLET | Freq: Two times a day (BID) | ORAL | 0 refills | Status: DC

## 2016-07-09 MED ORDER — NITROGLYCERIN 0.4 MG SL SUBL: 0.4000 mg | SUBLINGUAL_TABLET | SUBLINGUAL | 12 refills | Status: DC | PRN

## 2016-07-09 MED ORDER — ISOSORBIDE MONONITRATE ER 60 MG PO TB24: 60.0000 mg | ORAL_TABLET | Freq: Every day | ORAL | 0 refills | Status: DC

## 2016-07-09 MED ORDER — ASPIRIN 325 MG PO TABS: 325.0000 mg | ORAL_TABLET | Freq: Every day | ORAL | 0 refills | Status: DC

## 2016-07-09 NOTE — Progress Notes (Signed)
Clinical Social Worker met patient(pt) at bedside. Pt stated she did not want to go to SNF but to contact her husband and ask him his decision. CSW contacted pt's husband while in the room with pt's verbal consent. CSW spoke to Lambert Keto and Mr. Palmatier does not understand why patient has to go back to a SNF. CSW explained to Mr. Carbone the reasoning behind the SNF placement. Mr. Twiggs stated he does not know what to do and wants to speak to his children to get their opinion. Mr.Hohler stated he will give CSW his decision by the end of today 12/5 or tomorrow 12/6 at the latest. CSW explained to pt and Mr. Kruckenberg that a decision should be made soon so discharge does not get pushed back. CSW available to family if needed.   Rhea Pink, MSW,  [place]

## 2016-07-09 NOTE — Progress Notes (Signed)
Internal Medicine Attending:   I saw and examined the patient. I reviewed Dr Jamse Arn note and I agree with the resident's findings and plan as documented in his note. Her chest pain has resolved.  BUN and SCr are starting to climb.  Lungs are CTA although she does still have 1+ pedal edema.  Overall we are transitioning back to oral diuretics and plan for discharge to SNF or home with family if they can provider 24 hour care.

## 2016-07-09 NOTE — Progress Notes (Signed)
CSW spoke to husband and patient. Patient stated she will not go back to SNF and want to return home with husband. Patient's husband stated he will arrange transportation to take her back home. CSW is sighing off at this time.  [name], MSW,  [place]

## 2016-07-09 NOTE — Progress Notes (Signed)
Patient in a stable condition, this Rn completed discharge education with patient  and family at bedside, they verbalised understanding, patient belongings at bedside, iv removed , tele dc ccmd notified patient taken off the unit on a wheelchair by a NT

## 2016-07-09 NOTE — Progress Notes (Signed)
Initial Nutrition Assessment  DOCUMENTATION CODES:   Obesity unspecified  INTERVENTION:    Prostat liquid protein po 30 ml BID with meals, each supplement provides 100 kcal, 15 grams protein  NUTRITION DIAGNOSIS:   Increased nutrient needs related to wound healing as evidenced by estimated needs  GOAL:   Patient will meet greater than or equal to 90% of their needs  MONITOR:   PO intake, Supplement acceptance, Labs, Weight trends, Skin, I & O's  REASON FOR ASSESSMENT:   Consult Wound healing  ASSESSMENT:   75 YO F admitted 07/05/16 for chest pain and R foot and R lower leg wound from 03/01/16 popliteal to peroneal artery bypass procedure. PMH includes HTN, DM, diastolic acute CHF, PAD, R LE ulcer, CVA, aortic stenosis, neuropathy, edema.   CSW speaking with patient at bedside >> did not disturb. PO intake good at 85-100% per flowsheet records. Will order protein supplement for wound healing.  No muscle or subcutaneous fat depletion noticed. CBG's [id number].  Diet Order:  Diet heart healthy/carb modified Room service appropriate? Yes; Fluid consistency: Thin  Skin:  Wound (see comment) (diabetic foot ulcer)  Last BM:  12/4  Height:   Ht Readings from Last 1 Encounters:  07/05/16 5\' 4"  (1.626 m)    Weight:   Wt Readings from Last 1 Encounters:  07/08/16 215 lb (97.5 kg)    Ideal Body Weight:  54.5 kg  BMI:  Body mass index is 36.9 kg/m.  Estimated Nutritional Needs:   Kcal:  1800-2000  Protein:  90-100 gm  Fluid:  1.8-2.0 L  EDUCATION NEEDS:   No education needs identified at this time  Arthur Holms, RD, LDN Pager #: [phone] After-Hours Pager #: [phone]

## 2016-07-09 NOTE — Progress Notes (Signed)
Subjective: Patient is doing well today. Still has numbness and pain in her arms and legs. With PT, was able to stand but not able to get up into her chair. Denies chest pain. SOB has improved.  Objective: Vital signs in last 24 hours: Temp:  [97.7 F (36.5 C)-98.1 F (36.7 C)] 97.7 F (36.5 C) (12/05 0612) Pulse Rate:  [64-79] 67 (12/05 0612) Resp:  [19-20] 20 (12/05 0612) BP: (111-135)/(36-41) 135/36 (12/05 0612) SpO2:  [95 %-100 %] 100 % (12/05 0612)  Weight change:   Intake/Output Summary (Last 24 hours) at 07/09/16 [id number] Last data filed at 07/08/16 1238  Gross per 24 hour  Intake              240 ml  Output                0 ml  Net              240 ml     Physical Exam: General: well appearing, no acute distress,  HEENT: lips appear dry CV: Regular rate and rhythm, 3/6 systolic murmur loudest at left upper sternal border Pulm: normal work of breathing, clear to auscultation bilaterally Ext: 1+ edema up to the knee, weak pulses, swelling remains the same in hands, no significant change from yesterday Skin: Dressings clean and dry.  Lab Results: Results for orders placed or performed during the hospital encounter of 07/05/16 (from the past 24 hour(s))  Glucose, capillary     Status: Abnormal   Collection Time: 07/08/16 11:18 AM  Result Value Ref Range   Glucose-Capillary 178 (H) 65 - 99 mg/dL  Glucose, capillary     Status: Abnormal   Collection Time: 07/08/16  5:14 PM  Result Value Ref Range   Glucose-Capillary 322 (H) 65 - 99 mg/dL   Comment 1 Notify RN    Comment 2 Document in Chart   Glucose, capillary     Status: Abnormal   Collection Time: 07/08/16  9:12 PM  Result Value Ref Range   Glucose-Capillary 155 (H) 65 - 99 mg/dL  Glucose, capillary     Status: Abnormal   Collection Time: 07/09/16  6:26 AM  Result Value Ref Range   Glucose-Capillary 149 (H) 65 - 99 mg/dL   Lab Results  Component Value Date   HGBA1C 8.4 (H) 07/06/2016     Studies/Results:  Medications: I have reviewed the patient's current medications. Scheduled Meds: . amLODipine  10 mg Oral Daily  . aspirin  325 mg Oral Q breakfast  . atorvastatin  80 mg Oral Daily  . cholecalciferol  1,000 Units Oral Daily  . citalopram  20 mg Oral Daily  . clopidogrel  75 mg Oral Q breakfast  . docusate sodium  100 mg Oral BID  . enoxaparin (LOVENOX) injection  40 mg Subcutaneous Q24H  . fluticasone  1 spray Each Nare Daily  . furosemide  80 mg Intravenous BID  . insulin aspart  0-15 Units Subcutaneous TID WC  . insulin glargine  15 Units Subcutaneous BID  . isosorbide mononitrate  60 mg Oral Daily  . living well with diabetes book   Does not apply Once  . loratadine  10 mg Oral Daily  . losartan  100 mg Oral Daily  . metoprolol tartrate  50 mg Oral BID  . pantoprazole  40 mg Oral Daily  . polyethylene glycol  17 g Oral BID  . potassium chloride SA  20 mEq Oral Daily  . pregabalin  100  mg Oral TID  . ranolazine  500 mg Oral BID   Continuous Infusions: PRN Meds:.acetaminophen, albuterol, nitroGLYCERIN   Assessment/Plan: Principal Problem:   Acute on chronic diastolic heart failure (HCC) Active Problems:   HTN (hypertension)   Type 2 diabetes mellitus without complication, without long-term current use of insulin (HCC)   PAD (peripheral artery disease) (HCC)   Chest pain   Essential hypertension   Ulcer of right lower extremity (HCC)  [name] is a 75 y.o. female with 1 week of atypical chest pain and worsening lower extremity edema most likely consistent with demand ischemia and acute HF in the setting of uncontrolled hypertension. Little change in fluid volume from yesterday. Will plan to discharge to SNF or home today, as it seems unlikely more progress will be made.  Chest pain:  CAD is the most likely the underlying etiology of the patient's chest pain given her multiple risk factors and history. Mechanistically, her chest pain is probably  due to demand ischemia in the setting of increased afterload from uncontrolled hypertension. SPECT in 01/2016 showed possibility of minimal myocardial ischemia; however the quality of the study was poor.  - Tn 0.07 on admission; peaked at 0.09 on 12/2 - Per cardiology stress test and coronary angiography would not change management. She undoubtedly has CAD. She is not a good candidate for catheterization. Will manage medically and optimize medical therapy - continue home Aspirin, Plavix - atorvastatin 80 mg daily - isosorbide mononitrate, sublingual nitro, ranolazine added per cards  HFpEF: Echo (03/02/16): EF 55-60%, mild aortic stenosis, grade 2 diastolic dysfunction. No significant change in fluid volume from yesterday. BUN and HCO3- have bumped.  - d/c IV lasix 80mg  BID - restart home lasix regimen on discharge.  Hypertension, widened pulse pressure: In the ED, BP was noted to be 195/54. Today 122/47. Pulse pressure still noted to be widened. No evidence of aortic regurgitation on exam. May be related to age and patient's existing atherosclerotic disease. Per UpToDate, stiffening of the aorta with age reduces the elastic reservoir capacity, thus more blood runs off from each stroke volume during systole, resulting in a reduced blood volume within the aorta at the onset of diastole. When combined with diminished elastic recoil and an increased pressure decay rate, causes a fall in the diastolic pressure with age. - Continue home meds: amlodipine 10mg , losartan 100mg , metoprolol tartrate 50 mg BID - Continue management of existing chronic conditions including htn, hld, DM to prevent worsening of atherosclerosis.   Non-healing lower extremity wounds: Unlikely osteomyelitis given that she is afebrile w/out leukocytosis and no signs of infection. Radiographic findings negative. Wound likely not healing due to poor perfusion from patients PAD and/or lower extremity edema - Wound care consult - Continue  to monitor for fever, drainage, and developing leukocytosis - Wlll refer to Dr. Sharol Given for management (possile amputation)  DM: Last A1c 7.7 (03/02/2016) - Repeat A1c- 8.4; Sugars well controlled during hospitalization. Will need PCP follow-up for medication optimization. - Continue home medication: Lantus 15U qHS, Aspart 12U qAS - SSI  Diabetic neuropathy: Still complains of neuropathic pain, may be secondary to history of CVA - Citalopram 20 mg daily - Increase Pregabalin to 75 TID  GERD: - pantoprazole 40mg  daily  FEN/GI:  - Vit D, KCl tablets - Miralax 17g BID - Heart healthy diet  DVT ppx: Lovenox 40mg  IV  Code: Full code  Dispo: Plan to discharge to SNF or home today.  This is a Careers information officer Note.  The care of the patient was discussed with Dr. Heber Hendrix and the assessment and plan formulated with his assistance.  Please see his attached note for official documentation of the daily encounter.   LOS: 3 days   Armen Pickup, Medical Student 07/09/2016, 6:49 AM

## 2016-07-09 NOTE — Progress Notes (Signed)
CSW spoke to physician via and he stated that patient should be ready to discharge today. Physician stated he will speak to patient and family to give them a better understanding to why patient may need to return to a SNF. CSW has faxed over information for insurance approval.  Rhea Pink, MSW,  [place] [phone]

## 2016-07-09 NOTE — Care Management Note (Signed)
Case Management Note Marvetta Gibbons RN, BSN Unit 2W-Case Manager [phone]  Patient Details  Name: [name] MRN: [id number] Date of Birth: [birth year]  Subjective/Objective:   Pt admtited with acute on chronic HF                 Action/Plan: PTA pt lived at home with spouse- was active with [facility] ([facility])- recommendations for STSNF- CSW consulted- pt and spouse have refused SNF for rehab and want to return home with [facility] services- orders have been placed- have notified Vickie with [facility] to resume [facility] services- pt will also need hoyer lift for home- order placed- have notified Shannon with [facility] for DME need- lift to be delivered to home- per pt and spouse- they normally use [employer] transportation- husband has called to see if pick up can be arranged for this evening if not then pt will go home by [facility]-   Expected Discharge Date:     07/09/16             Expected Discharge Plan:  [place]  In-House Referral:  Clinical Social Work  Discharge planning Services  CM Consult  Post Acute Care Choice:  Home Health, Resumption of Svcs/PTA Provider, Durable Medical Equipment Choice offered to:  Patient, Spouse  DME Arranged:  Other see comment DME Agency:  [place] Arranged:  RN, PT, OT, Nurse's Aide, Speech Therapy, Social Work [employer] Agency:  [place]  Status of Service:  Completed, signed off  If discussed at [employer], dates discussed:    Discharge Disposition: home with home health   Additional Comments:  07/09/16- 1630- update- per pt's spouse- [employer] transportation will be her to provide transportation home at 545pm this evening - husband has pt's wheelchair here for transport home. Bedside RN aware.   Dawayne Patricia, RN 07/09/2016, 4:06 PM

## 2016-07-09 NOTE — Progress Notes (Signed)
Subjective: Currently, the patient is feeling improved. CP improved. Still complains of UE paraesthesias. Does not want to go to SNF, will discuss with family.  Interval Events: PT eval recs 24h supervision and SNF.  Objective: Vital signs in last 24 hours: Vitals:   07/07/16 1350 07/07/16 2201 07/08/16 0246 07/08/16 0414  BP: 99/86 (!) 122/39  (!) 123/41  Pulse: 70 73  69  Resp: 18 18  18   Temp: 97.6 F (36.4 C) 97.7 F (36.5 C)  97.9 F (36.6 C)  TempSrc: Oral Oral  Oral  SpO2: 94% 93%  95%  Weight:   215 lb (97.5 kg)   Height:       24-hour weight change: Filed Weights   07/05/16 1045 07/07/16 0451 07/08/16 0246  Weight: 210 lb (95.3 kg) 218 lb 6.4 oz (99.1 kg) 215 lb (97.5 kg)   Dry Wt: ~200 lbs in June 2017.  Intake/Output:  12/03 0701 - 12/04 0700 In: 840 [P.O.:840] Out: -     Physical Exam: Physical Exam  Constitutional: She appears well-developed. She is cooperative. No distress.  Cardiovascular: Normal rate, regular rhythm, S1 normal and S2 normal.  Exam reveals no gallop.   Murmur heard.  Systolic murmur is present with a grade of 3/6  Pulses:      Radial pulses are 2+ on the right side, and 2+ on the left side.       Dorsalis pedis pulses are 1+ on the right side, and 1+ on the left side.  Pulmonary/Chest: Effort normal and breath sounds normal. No respiratory distress. Breasts are symmetrical.  Abdominal: Soft. Bowel sounds are normal. There is no tenderness.  Musculoskeletal: She exhibits edema (2+ BLE to the mid shin) and tenderness (TTP RLE).  Bandaged R medial calf wound, c/d/i. Bandaged R MTP wound, c/d/i.   Labs: CBC:  Recent Labs Lab 07/01/16 1704 07/05/16 1129  WBC 10.0 10.0  NEUTROABS 5.6  --   HGB 12.4 12.0  HCT 38.6 38.0  MCV 83.6 84.1  PLT [id number]   Metabolic Panel:  Recent Labs Lab 07/01/16 1704 07/05/16 1129 07/08/16 0345  NA 139 140 138  K 4.2 4.5 3.7  CL 102 106 104  CO2 27 24 27   GLUCOSE 99 206* 158*  BUN 22*  18 15  CREATININE 0.81 0.74 0.87  CALCIUM 9.4 9.6 9.1  ALT 29  --   --   ALKPHOS 62  --   --   BILITOT 0.4  --   --   PROT 7.0  --   --   ALBUMIN 3.7  --   --    Cardiac Labs:  Recent Labs Lab 07/05/16 1155  07/05/16 2345 07/06/16 0537 07/06/16 1326 07/06/16 1842 07/07/16 0045  TROPIPOC 0.07  --   --   --   --   --   --   TROPONINI  --   < > 0.04* 0.09* 0.05* 0.04* 0.05*  < > = values in this interval not displayed. BG:  Recent Labs Lab 07/07/16 0636 07/07/16 1058 07/07/16 1630 07/07/16 2158 07/08/16 0644  GLUCAP 109* 254* 152* 126* 148*     Lab Results  Component Value Date   HGBA1C 8.4 (H) 07/06/2016    Imaging: RLE XR w/o signs of osteo. CXR patchy infiltrates, improved from prior.   Medications: Scheduled Medications: . amLODipine  10 mg Oral Daily  . aspirin  325 mg Oral Q breakfast  . atorvastatin  80 mg Oral Daily  .  cholecalciferol  1,000 Units Oral Daily  . citalopram  20 mg Oral Daily  . clopidogrel  75 mg Oral Q breakfast  . docusate sodium  100 mg Oral BID  . enoxaparin (LOVENOX) injection  40 mg Subcutaneous Q24H  . fluticasone  1 spray Each Nare Daily  . furosemide  80 mg Intravenous BID  . insulin aspart  0-15 Units Subcutaneous TID WC  . insulin glargine  15 Units Subcutaneous BID  . isosorbide mononitrate  60 mg Oral Daily  . living well with diabetes book   Does not apply Once  . loratadine  10 mg Oral Daily  . losartan  100 mg Oral Daily  . metoprolol tartrate  50 mg Oral BID  . pantoprazole  40 mg Oral Daily  . polyethylene glycol  17 g Oral BID  . potassium chloride SA  20 mEq Oral Daily  . pregabalin  50 mg Oral TID  . ranolazine  500 mg Oral BID   PRN Medications: acetaminophen, albuterol, nitroGLYCERIN  Assessment/Plan: [name] is a 75 y.o. female with severe PAD s/p multiple revascularization surgeries in RLE and bilateral carotids, s/p L ICA infarct in July w/ persistent right sided MCA deficits, who presented  for complaint of CP from her vascular surgery clinic visit.  1) CP: Typical CP w/ small Trop bump. Suspect demand ischemia 2/2 decompensation of HF and HTN. Likely also small NSTEMI. Per Cards, not a cath candidate, optimize medical management. - Continue atorva 80mg  - Ranexa, Imdur, SL nitro PRN angina - continue home meds: ASA, plavix  2) HFpEF: Echo in July w/ EF of 55-60% and grade 2 diastolic dysfunction w/ significant LVH. Still w/ some peripheral edema, lung CTAB. Likely require 1 more day of IV diuresis. SCr/BUN bump today. Likely dry w/ some chronic venous insufficiency. Recommend elevation. - Restart 80mg  BID PO per home regimen.  3) Non-healing leg wounds: Stable. Per [facility], plans to refer to Dr. Sharol Given. - WOC c/s  4) L ICA occlusion / parasthesias: Neuro exam stable. Persistent deficits and complaints of paraesthesias. Would like f/u scheduled w/ Dr. Erlinda Hong for continued evaluation following her CVA, but she has not been able to accomplish this. - Continue pregabalin to 100mg  TID - citalopram 20mg  qD  5) HTN: Controlled. Continue home meds: amlodipine 10mg  and losartan 100mg .  6) DM: Last A1c 7.7. Home meds: Lantus 15U qHS, Aspart 12U qAC. Continue home meds + SSI.  Length of Stay: 2 day(s) Dispo: Anticipated discharge today  Holley Raring, MD Pager: [phone] (7AM-5PM) 07/08/2016, 11:26 AM

## 2016-07-09 NOTE — Discharge Instructions (Signed)
You were admitted to [facility] for heart failure exacerbation. We gave you medicine to reduce the amount of fluid in your body. We also added some medications to help reduce your chest pain. Please take all of your medication as prescribed. In addition, we recommend that you limit the amount of salt in your diet.and encourage you to talk to your primary care doctor about diet plan. We have scheduled a follow-up appointment with your primary care doctor. If you notice increasing swelling or chest pain, please do not wait for your outpatient appointment and go immediately to the Emergency Department.

## 2016-07-23 ENCOUNTER — Ambulatory Visit (HOSPITAL_COMMUNITY)
Admission: RE | Admit: 2016-07-23 | Discharge: 2016-07-23 | Disposition: A | Discharge status: Home / Self Care | Payer: Medicare HMO | Source: Ambulatory Visit | Attending: Neurological Surgery | Admitting: Neurological Surgery

## 2016-07-23 DIAGNOSIS — R220 Localized swelling, mass and lump, head: Secondary | ICD-10-CM | POA: Diagnosis not present

## 2016-07-23 DIAGNOSIS — I6782 Cerebral ischemia: Secondary | ICD-10-CM | POA: Insufficient documentation

## 2016-07-23 DIAGNOSIS — M4722 Other spondylosis with radiculopathy, cervical region: Secondary | ICD-10-CM | POA: Insufficient documentation

## 2016-07-23 DIAGNOSIS — G319 Degenerative disease of nervous system, unspecified: Secondary | ICD-10-CM | POA: Diagnosis not present

## 2016-07-23 DIAGNOSIS — M4802 Spinal stenosis, cervical region: Secondary | ICD-10-CM | POA: Insufficient documentation

## 2016-07-26 ENCOUNTER — Encounter: Payer: Self-pay | Admitting: Vascular Surgery

## 2016-08-01 ENCOUNTER — Encounter: Payer: Self-pay | Admitting: Emergency Medicine

## 2016-08-01 ENCOUNTER — Emergency Department
Admission: EM | Admit: 2016-08-01 | Discharge: 2016-08-01 | Disposition: A | Discharge status: Home / Self Care | Payer: Medicare HMO | Attending: Emergency Medicine | Admitting: Emergency Medicine

## 2016-08-01 DIAGNOSIS — Z794 Long term (current) use of insulin: Secondary | ICD-10-CM | POA: Diagnosis not present

## 2016-08-01 DIAGNOSIS — Z7982 Long term (current) use of aspirin: Secondary | ICD-10-CM | POA: Insufficient documentation

## 2016-08-01 DIAGNOSIS — E119 Type 2 diabetes mellitus without complications: Secondary | ICD-10-CM | POA: Insufficient documentation

## 2016-08-01 DIAGNOSIS — I5033 Acute on chronic diastolic (congestive) heart failure: Secondary | ICD-10-CM | POA: Diagnosis not present

## 2016-08-01 DIAGNOSIS — L03115 Cellulitis of right lower limb: Secondary | ICD-10-CM | POA: Diagnosis present

## 2016-08-01 DIAGNOSIS — Z79899 Other long term (current) drug therapy: Secondary | ICD-10-CM | POA: Insufficient documentation

## 2016-08-01 DIAGNOSIS — L0390 Cellulitis, unspecified: Secondary | ICD-10-CM

## 2016-08-01 DIAGNOSIS — I110 Hypertensive heart disease with heart failure: Secondary | ICD-10-CM | POA: Insufficient documentation

## 2016-08-01 NOTE — Care Management (Signed)
Patient is currently followed in the home for wound care by [facility] ([facility]). They are following her also for PT and OT. I have updated [name] with [facility] of patient in ED.

## 2016-08-01 NOTE — Discharge Instructions (Signed)
Please seek medical attention for any high fevers, chest pain, shortness of breath, change in behavior, persistent vomiting, bloody stool or any other new or concerning symptoms.  

## 2016-08-01 NOTE — ED Triage Notes (Addendum)
Pt was sent over from [facility] for further eval of possible wound infection to right lower leg.  Pt currently taking antibiotics for infection and a culture was sent off yesterday.

## 2016-08-01 NOTE — ED Provider Notes (Signed)
[facility] Emergency Department Provider Note   ____________________________________________   I have reviewed the triage vital signs and the nursing notes.   HISTORY  Chief Complaint Wound Infection   History limited by: Not Limited   HPI [name] is a 75 y.o. female who presents to the emergency department today from [place] clinic because of concern for right lower leg wound. The patient states that she first developed the wound of concern in her right lower leg 4 days ago. She did go to her clinic where the prescribed antibiotics, started them yesterday. Sent from the clinic today because of possible need for debridement. The patient denies any fevers, nausea or vomiting recently. The patient has a few other wounds on the right leg that are apparently being managed by a doctor in [place].     Past Medical History:  Diagnosis Date  . Acute heart failure ([place])   . Aortic stenosis   . Complication of anesthesia    Hard to wake patient up after having anesthesia  . Diabetes mellitus without complication ([place])   . Diabetic neuropathy (HCC)    Takes Lyrica  . Edema of both legs    Takes Lasix  . GERD (gastroesophageal reflux disease)   . High cholesterol   . HTN (hypertension)   . Hypertension   . PAD (peripheral artery disease) ([place])   . Shortness of breath dyspnea   . Spasm of back muscles   . Stroke ([place])   . Wound, open    Right great toe    Patient Active Problem List   Diagnosis Date Noted  . Essential hypertension   . Acute on chronic diastolic heart failure ([place])   . Ulcer of right lower extremity ([place])   . Chest pain 07/05/2016  . Pleural effusion, bilateral   . Pulmonary congestion   . Wheeze   . Dyspnea   . CHF (congestive heart failure) ([place])   . Right sided weakness   . Acute respiratory acidosis   . Surgery, elective   . Anemia, iron deficiency   . Leukocytosis   . Cerebrovascular accident (CVA) due to thrombosis of  precerebral artery ([place]) 03/01/2016  . 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 Acute respiratory failure ([place])   . PAD (peripheral artery disease) ([place]) 02/28/2016  . Atherosclerosis of native arteries of the extremities with ulceration ([place]) 12/08/2015  . Open toe wound 05/04/2014  . Anemia, chronic disease 04/27/2014  . Occult blood positive stool 04/17/2014  . Edema 04/03/2014  . Unspecified hereditary and idiopathic peripheral neuropathy 03/14/2014  . Acute and chronic respiratory failure 02/14/2014  . Type II or unspecified type diabetes mellitus with neurological manifestations, uncontrolled(250.62) 02/14/2014  . Respiratory failure ([place]) 02/12/2014  . Altered mental status 02/12/2014  . Acute encephalopathy 02/12/2014  . Acute respiratory failure with hypercapnia ([place]) 02/12/2014  . Aortic stenosis 02/04/2014  . Closed left subtrochanteric femur fracture ([place]) 02/03/2014  . Hip fracture, left ([place]) 02/03/2014  . Hypokalemia 02/03/2014  . Fibula fracture 02/03/2014  . MTP instability 02/03/2014  . Hip fracture ([place]) 02/03/2014  . HTN (hypertension)   . Type 2 diabetes mellitus without complication, without long-term current use of insulin ([place])   . HLD (hyperlipidemia)     Past Surgical History:  Procedure Laterality Date  . BYPASS GRAFT POPLITEAL TO TIBIAL Right 02/28/2016   Procedure: BYPASS GRAFT RIGHT BELOW KNEE POPLITEAL TO PERONEAL USING REVERSED RIGHT GREATER SAPHENOUS VEIN;  Surgeon: Conrad Ashaway, MD;  Location: [facility];  Service: Vascular;  Laterality: Right;  . CYST EXCISION     Abdomen  . EYE SURGERY Bilateral    Cataract removal  . INTRAMEDULLARY (IM) NAIL INTERTROCHANTERIC Left 02/04/2014   Procedure: INTRAMEDULLARY (IM) NAIL INTERTROCHANTRIC FEMORAL;  Surgeon: Mauri Pole, MD;  Location: [facility];  Service: Orthopedics;  Laterality: Left;  . IR GENERIC HISTORICAL  03/01/2016   IR ANGIO INTRA EXTRACRAN SEL COM CAROTID  INNOMINATE UNI R MOD SED 03/01/2016 [name], MD [facility]  . IR GENERIC HISTORICAL  03/01/2016   IR ENDOVASC INTRACRANIAL INF OTHER THAN THROMBO ART INC DIAG ANGIO 03/01/2016 [name], MD [facility]  . IR GENERIC HISTORICAL  03/01/2016   IR INTRAVSC STENT CERV CAROTID W/O EMB-PROT MOD SED INC ANGIO 03/01/2016 [name], MD [facility]  . IR GENERIC HISTORICAL  06/03/2016   IR RADIOLOGIST EVAL & MGMT 06/03/2016 [facility]  . ORIF TOE FRACTURE Right 02/08/2014   Procedure: OPEN REDUCTION INTERNAL FIXATION Right METATARSAL  FRACTURE ;  Surgeon: Wylene Simmer, MD;  Location: [facility];  Service: Orthopedics;  Laterality: Right;  . PERIPHERAL VASCULAR CATHETERIZATION N/A 12/28/2015   Procedure: Abdominal Aortogram w/Lower Extremity;  Surgeon: Conrad [place], MD;  Location: [facility];  Service: Cardiovascular;  Laterality: N/A;  . RADIOLOGY WITH ANESTHESIA N/A 03/01/2016   Procedure: RADIOLOGY WITH ANESTHESIA;  Surgeon: [name], MD;  Location: [facility];  Service: Radiology;  Laterality: N/A;  . VEIN HARVEST Right 02/28/2016   Procedure: RIGHT GREATER SAPHENOUS VEIN HARVEST;  Surgeon: Conrad [place], MD;  Location: [facility];  Service: Vascular;  Laterality: Right;    Prior to Admission medications   Medication Sig Start Date End Date Taking? Authorizing Provider  acetaminophen (TYLENOL) 500 MG tablet Take 500 mg by mouth every 6 (six) hours as needed for mild pain.    Historical Provider, MD  albuterol (PROVENTIL HFA;VENTOLIN HFA) 108 (90 Base) MCG/ACT inhaler Inhale 2 puffs into the lungs every 6 (six) hours as needed.    Historical Provider, MD  amLODipine (NORVASC) 10 MG tablet Take 1 tablet (10 mg total) by mouth daily. 03/13/16   Alvia Grove, PA-C  aspirin 325 MG tablet Take 1 tablet (325 mg total) by mouth daily with breakfast. 07/10/16   Holley Raring, MD  atorvastatin (LIPITOR) 20 MG tablet Take 1 tablet (20 mg total) by mouth daily. 03/13/16   Alvia Grove, PA-C  B-D INS SYRINGE 0.5CC/31GX5/16 31G X 5/16" 0.5 ML MISC  02/05/16   Historical Provider, MD  cetirizine (ZYRTEC) 10 MG tablet Take 10 mg by mouth daily.     Historical Provider, MD  cholecalciferol (VITAMIN D) 1000 units tablet Take 1,000 Units by mouth daily.    Historical Provider, MD  citalopram (CELEXA) 20 MG tablet Take 20 mg by mouth daily.    Historical Provider, MD  clopidogrel (PLAVIX) 75 MG tablet Take 1 tablet (75 mg total) by mouth daily with breakfast. 03/13/16   Alvia Grove, PA-C  collagenase (SANTYL) ointment Apply topically 2 (two) times daily. Apply to dorsum right foot wound daily. 03/13/16   Alvia Grove, PA-C  docusate sodium (COLACE) 100 MG capsule Take 100 mg by mouth 2 (two) times daily.    Historical Provider, MD  doxycycline (VIBRAMYCIN) 100 MG capsule Take 1 capsule (100 mg total) by mouth 2 (two) times daily. 07/01/16   Orbie Pyo, MD  fluticasone (FLONASE) 50 MCG/ACT  nasal spray Place 1 spray into both nostrils daily.    Historical Provider, MD  furosemide (LASIX) 40 MG tablet Take 80 mg by mouth 2 (two) times daily.  01/08/16   Historical Provider, MD  insulin aspart (NOVOLOG) 100 UNIT/ML injection Inject 0-15 Units into the skin 3 (three) times daily with meals. Before each meal 3 times a day, 140-199 - 3 units, 200-250 - 5 units, 251-299 - 8 units,  300-349 - 12 units,  350 or above 15 units. Patient taking differently: Inject 12 Units into the skin 3 (three) times daily with meals.  02/09/14   Thurnell Lose, MD  insulin glargine (LANTUS) 100 UNIT/ML injection Inject 0.15 mLs (15 Units total) into the skin 2 (two) times daily. Patient taking differently: Inject 30 Units into the skin at bedtime.  03/13/16   Alvia Grove, PA-C  isosorbide mononitrate (IMDUR) 60 MG 24 hr tablet Take 1 tablet (60 mg total) by mouth daily. 07/10/16   Holley Raring, MD  losartan (COZAAR) 100 MG tablet Take 1 tablet (100 mg total) by mouth daily. 02/13/14   Samuella Cota, MD  metoprolol (LOPRESSOR) 50 MG tablet Take 1 tablet (50 mg total) by mouth 2 (two) times daily. 07/09/16   Holley Raring, MD  nitroGLYCERIN (NITROSTAT) 0.4 MG SL tablet Place 1 tablet (0.4 mg total) under the tongue every 5 (five) minutes as needed for chest pain. 07/09/16   Holley Raring, MD  omeprazole (PRILOSEC) 20 MG capsule Take 20 mg by mouth daily.    Historical Provider, MD  polyethylene glycol (MIRALAX / GLYCOLAX) packet Take 17 g by mouth 2 (two) times daily. Patient taking differently: Take 17 g by mouth daily as needed for mild constipation.  02/09/14   Thurnell Lose, MD  potassium chloride SA (K-DUR,KLOR-CON) 20 MEQ tablet Take 20 mEq by mouth daily.    Historical Provider, MD  pregabalin (LYRICA) 50 MG capsule Take 50 mg by mouth 3 (three) times daily.    Historical Provider, MD  ranolazine (RANEXA) 500 MG 12 hr tablet Take 1 tablet (500 mg total) by mouth 2 (two) times daily. 07/09/16   Holley Raring, MD    Allergies Iodine; Penicillins; Shellfish allergy; Shellfish-derived products; Sulfa antibiotics; Sulfacetamide sodium; Sulfasalazine; Atorvastatin; and Eggs or egg-derived products  Family History  Problem Relation Age of Onset  . Diabetes Other     Social History Social History  Substance Use Topics  . Smoking status: Never Smoker  . Smokeless tobacco: Never Used     Comment: Never smoked  . Alcohol use No    Review of Systems  Constitutional: Negative for fever. Cardiovascular: Negative for chest pain. Respiratory: Negative for shortness of breath. Gastrointestinal: Negative for abdominal pain, vomiting and diarrhea. Skin: Positive for wound to the right leg.  Neurological: Negative for headaches, focal weakness or numbness.  10-point ROS otherwise negative.  ____________________________________________   PHYSICAL EXAM:  VITAL SIGNS: ED Triage Vitals  Enc Vitals Group     BP 08/01/16 1212 (!) 151/52     Pulse Rate 08/01/16 1212 74      Resp 08/01/16 1212 20     Temp 08/01/16 1212 97.5 F (36.4 C)     Temp Source 08/01/16 1212 Oral     SpO2 08/01/16 1212 100 %     Weight 08/01/16 1211 213 lb (96.6 kg)     Height 08/01/16 1211 5\' 4"  (1.626 m)    Constitutional: Alert and oriented. Well appearing and in  no distress. Eyes: Conjunctivae are normal. Normal extraocular movements. ENT   Head: Normocephalic and atraumatic.   Nose: No congestion/rhinnorhea.   Mouth/Throat: Mucous membranes are moist.   Neck: No stridor. Hematological/Lymphatic/Immunilogical: No cervical lymphadenopathy. Cardiovascular: Normal rate, regular rhythm.  No murmurs, rubs, or gallops.  Respiratory: Normal respiratory effort without tachypnea nor retractions. Breath sounds are clear and equal bilaterally. No wheezes/rales/rhonchi. Gastrointestinal: Soft and non tender. No rebound. No guarding.  Genitourinary: Deferred Musculoskeletal: Normal range of motion in all extremities. No lower extremity edema. Neurologic:  Normal speech and language. No gross focal neurologic deficits are appreciated.  Skin:  Roughly 3 cm diameter wound to the right leg. Small amount of pus was elicited, small amount of surrounding erythema. No fluctuance. In addition patient has two small wounds to an old surgical scar higher up her leg and one small wound to the base of the first toe. Psychiatric: Mood and affect are normal. Speech and behavior are normal. Patient exhibits appropriate insight and judgment.  ____________________________________________    LABS (pertinent positives/negatives)  None  ____________________________________________   EKG  None  ____________________________________________    RADIOLOGY  None  ____________________________________________   PROCEDURES  Procedures  ____________________________________________   INITIAL IMPRESSION / ASSESSMENT AND PLAN / ED COURSE  Pertinent labs & imaging results that were  available during my care of the patient were reviewed by me and considered in my medical decision making (see chart for details).  Patient presented to the emergency department because of primary care request for debridement of wound. Discussed with the patient that we do not routinely debride here in the emergency department. No fevers/n/v. Vitals within normal limits. At this point think continued oral antibiotic reasonable. Did discuss wound care center follow up with patient.   ____________________________________________   FINAL CLINICAL IMPRESSION(S) / ED DIAGNOSES  Final diagnoses:  Wound cellulitis     Note: This dictation was prepared with Dragon dictation. Any transcriptional errors that result from this process are unintentional     Nance Pear, MD 08/01/16 1435

## 2016-08-02 ENCOUNTER — Other Ambulatory Visit (HOSPITAL_COMMUNITY): Payer: Medicare HMO

## 2016-08-02 ENCOUNTER — Encounter (HOSPITAL_COMMUNITY): Payer: Medicare HMO

## 2016-08-09 ENCOUNTER — Ambulatory Visit: Payer: Medicare HMO | Admitting: Vascular Surgery

## 2016-08-13 ENCOUNTER — Encounter: Payer: Medicare HMO | Attending: Internal Medicine | Admitting: Internal Medicine

## 2016-08-13 DIAGNOSIS — L97211 Non-pressure chronic ulcer of right calf limited to breakdown of skin: Secondary | ICD-10-CM | POA: Insufficient documentation

## 2016-08-13 DIAGNOSIS — Z880 Allergy status to penicillin: Secondary | ICD-10-CM | POA: Insufficient documentation

## 2016-08-13 DIAGNOSIS — E11622 Type 2 diabetes mellitus with other skin ulcer: Secondary | ICD-10-CM | POA: Insufficient documentation

## 2016-08-13 DIAGNOSIS — I509 Heart failure, unspecified: Secondary | ICD-10-CM | POA: Diagnosis not present

## 2016-08-13 DIAGNOSIS — E11621 Type 2 diabetes mellitus with foot ulcer: Secondary | ICD-10-CM | POA: Insufficient documentation

## 2016-08-13 DIAGNOSIS — L97528 Non-pressure chronic ulcer of other part of left foot with other specified severity: Secondary | ICD-10-CM | POA: Diagnosis not present

## 2016-08-13 DIAGNOSIS — Z8673 Personal history of transient ischemic attack (TIA), and cerebral infarction without residual deficits: Secondary | ICD-10-CM | POA: Insufficient documentation

## 2016-08-13 DIAGNOSIS — I110 Hypertensive heart disease with heart failure: Secondary | ICD-10-CM | POA: Diagnosis not present

## 2016-08-13 DIAGNOSIS — Y832 Surgical operation with anastomosis, bypass or graft as the cause of abnormal reaction of the patient, or of later complication, without mention of misadventure at the time of the procedure: Secondary | ICD-10-CM | POA: Diagnosis not present

## 2016-08-13 DIAGNOSIS — T8131XA Disruption of external operation (surgical) wound, not elsewhere classified, initial encounter: Secondary | ICD-10-CM | POA: Diagnosis not present

## 2016-08-14 NOTE — Progress Notes (Signed)
LOUCILLE, MIGLIORI ([id number]) Visit Report for 08/13/2016 Abuse/Suicide Risk Screen Details Patient Name: Nutter, Besan V. Date of Service: 08/13/2016 9:30 AM Medical Record Patient Account Number: [id number] Number: Treating RN: Cornell Barman August 22, 1940 (75 y.o. Other Clinician: Date of Birth/Sex: Female) Treating ROBSON, [name] Primary Care Physician/Extender: Charna Busman Physician: Referring Physician: Dorthula Nettles in Treatment: 0 Abuse/Suicide Risk Screen Items Answer ABUSE/SUICIDE RISK SCREEN: Has anyone close to you tried to hurt or harm you recentlyo No Do you feel uncomfortable with anyone in your familyo No Has anyone forced you do things that you didnot want to doo No Do you have any thoughts of harming yourselfo No Patient displays signs or symptoms of abuse and/or neglect. No Electronic Signature(s) Signed: 08/14/2016 2:01:17 PM By: Gretta Cool, RN, BSN, Kim RN, BSN Entered By: Gretta Cool, RN, BSN, Kim on 08/13/2016 10:22:24 Saulsbury, [name] ([id number]) -------------------------------------------------------------------------------- Activities of Daily Living Details Patient Name: Athey, Onda V. Date of Service: 08/13/2016 9:30 AM Medical Record Patient Account Number: [id number] Number: Treating RN: Cornell Barman 07/20/1941 (75 y.o. Other Clinician: Date of Birth/Sex: Female) Treating ROBSON, [name] Primary Care Physician/Extender: Charna Busman Physician: Referring Physician: Dorthula Nettles in Treatment: 0 Activities of Daily Living Items Answer Activities of Daily Living (Please select one for each item) Drive Automobile Not Able Take Medications Need Assistance Use Telephone Need Assistance Care for Appearance Need Assistance Use Toilet Need Assistance Bath / Shower Need Assistance Dress Self Need Assistance Feed Self Need Assistance Walk Need Assistance Get In / Out Bed Need Assistance Housework Need Assistance Prepare Meals Need  Assistance Handle Money Need Assistance Shop for Self Need Assistance Electronic Signature(s) Signed: 08/14/2016 2:01:17 PM By: Gretta Cool, RN, BSN, Kim RN, BSN Entered By: Gretta Cool, RN, BSN, Kim on 08/13/2016 10:22:40 Vanderveer, [name] ([id number]) -------------------------------------------------------------------------------- Education Assessment Details Patient Name: Minier, Carinne V. Date of Service: 08/13/2016 9:30 AM Medical Record Patient Account Number: [id number] Number: Treating RN: Cornell Barman 05/13/1941 (75 y.o. Other Clinician: Date of Birth/Sex: Female) Treating ROBSON, [name] Primary Care Physician/Extender: Charna Busman Physician: Referring Physician: Dorthula Nettles in Treatment: 0 Primary Learner Assessed: Patient Learning Preferences/Education Level/Primary Language Learning Preference: Explanation, Demonstration Highest Education Level: College or Above Preferred Language: English Cognitive Barrier Assessment/Beliefs Language Barrier: No Translator Needed: No Memory Deficit: Yes Emotional Barrier: No Cultural/Religious Beliefs Affecting Medical No Care: Physical Barrier Assessment Impaired Vision: Yes Glasses Impaired Hearing: No Decreased Hand dexterity: No Knowledge/Comprehension Assessment Knowledge Level: Low Comprehension Level: Low Ability to understand written Low instructions: Ability to understand verbal Low instructions: Motivation Assessment Anxiety Level: Calm Cooperation: Cooperative Education Importance: Acknowledges Need Interest in Health Problems: Uninterested Perception: Confused Willingness to Engage in Self- Low Management Activities: Low Alvis, Silvanna V. ([id number]) Readiness to Engage in Self- Management Activities: Electronic Signature(s) Signed: 08/14/2016 2:01:17 PM By: Gretta Cool, RN, BSN, Kim RN, BSN Entered By: Gretta Cool, RN, BSN, Kim on 08/13/2016 10:23:44 Fiebig, [name]  ([id number]) -------------------------------------------------------------------------------- Fall Risk Assessment Details Patient Name: Scharrer, Sadeen V. Date of Service: 08/13/2016 9:30 AM Medical Record Patient Account Number: [id number] Number: Treating RN: Cornell Barman 07/18/1941 (75 y.o. Other Clinician: Date of Birth/Sex: Female) Treating ROBSON, [name] Primary Care Physician/Extender: Charna Busman Physician: Referring Physician: Dorthula Nettles in Treatment: 0 Fall Risk Assessment Items Have you had 2 or more falls in the last 12 monthso 0 No Have you had any fall that resulted in injury in the last 12 monthso 0 No FALL RISK ASSESSMENT: History of falling -  immediate or within 3 months 0 No Secondary diagnosis 15 Yes Ambulatory aid None/bed rest/wheelchair/nurse 0 No Crutches/cane/walker 15 Yes Furniture 0 No IV Access/Saline Lock 0 No Gait/Training Normal/bed rest/immobile 0 No Weak 10 Yes Impaired 0 No Mental Status Oriented to own ability 0 No Electronic Signature(s) Signed: 08/14/2016 2:01:17 PM By: Gretta Cool, RN, BSN, Kim RN, BSN Entered By: Gretta Cool, RN, BSN, Kim on 08/13/2016 10:24:30 Morre, Esbeydi [name] ([id number]) -------------------------------------------------------------------------------- Foot Assessment Details Patient Name: Wilkerson, Kenyona V. Date of Service: 08/13/2016 9:30 AM Medical Record Patient Account Number: [id number] Number: Treating RN: Cornell Barman September 21, 1940 (75 y.o. Other Clinician: Date of Birth/Sex: Female) Treating ROBSON, [name] Primary Care Physician/Extender: Charna Busman Physician: Referring Physician: Dorthula Nettles in Treatment: 0 Foot Assessment Items Site Locations + = Sensation present, - = Sensation absent, C = Callus, U = Ulcer R = Redness, W = Warmth, M = Maceration, PU = Pre-ulcerative lesion F = Fissure, S = Swelling, D = Dryness Assessment Right: Left: Other Deformity: No No Prior Foot Ulcer: No  No Prior Amputation: No No Charcot Joint: No No Ambulatory Status: Ambulatory With Help Assistance Device: Wheelchair Gait: Administrator, arts) Signed: 08/14/2016 2:01:17 PM By: Gretta Cool, RN, BSN, Kim RN, BSN Tejera, Emylia V. ([id number]) Entered By: Gretta Cool, RN, BSN, Kim on 08/13/2016 10:29:42 Hyams, [name] ([id number]) -------------------------------------------------------------------------------- Nutrition Risk Assessment Details Patient Name: Huss, [employer] Date of Service: 08/13/2016 9:30 AM Medical Record Patient Account Number: [id number] Number: Treating RN: Cornell Barman [birth year] (75 y.o. Other Clinician: Date of Birth/Sex: Female) Treating ROBSON, [name] Primary Care Physician/Extender: Charna Busman Physician: Referring Physician: Dorthula Nettles in Treatment: 0 Height (in): 64 Weight (lbs): 216 Body Mass Index (BMI): 37.1 Nutrition Risk Assessment Items NUTRITION RISK SCREEN: I have an illness or condition that made me change the kind and/or 0 No amount of food I eat I eat fewer than two meals per day 0 No I eat few fruits and vegetables, or milk products 0 No I have three or more drinks of beer, liquor or wine almost every day 0 No I have tooth or mouth problems that make it hard for me to eat 0 No I don't always have enough money to buy the food I need 0 No I eat alone most of the time 0 No I take three or more different prescribed or over-the-counter drugs a 1 Yes day Without wanting to, I have lost or gained 10 pounds in the last six 0 No months I am not always physically able to shop, cook and/or feed myself 0 No Nutrition Protocols Good Risk Protocol Provide education on elevated blood sugars Moderate Risk Protocol 0 and impact on wound healing, as applicable Electronic Signature(s) Signed: 08/14/2016 2:01:17 PM By: Gretta Cool, RN, BSN, Kim RN, BSN Entered By: Gretta Cool, RN, BSN, Kim on 08/13/2016 10:25:04

## 2016-08-14 NOTE — Progress Notes (Signed)
[name], REZEK ([id number]) Visit Report for 08/13/2016 Allergy List Details Patient Name: [name], [name] Date of Service: 08/13/2016 9:30 AM Medical Record Patient Account Number: [id number] Number: Treating RN: Cornell Barman August 24, 1940 (76 y.o. Other Clinician: Date of Birth/Sex: Female) Treating ROBSON, [name] Primary Care Physician: Beverlyn Roux Physician/Extender: G Referring Physician: Dorthula Nettles in Treatment: 0 Allergies Active Allergies penicillin sulfamethizole iodine Allergy Notes Electronic Signature(s) Signed: 08/14/2016 2:01:17 PM By: Gretta Cool, RN, BSN, Kim RN, BSN Entered By: Gretta Cool, RN, BSN, Kim on 08/13/2016 10:14:28 Cassin, Carrolyn White ([id number]) -------------------------------------------------------------------------------- Arrival Information Details Patient Name: [name], [name] Date of Service: 08/13/2016 9:30 AM Medical Record Patient Account Number: [id number] Number: Treating RN: Cornell Barman 1940-09-09 (76 y.o. Other Clinician: Date of Birth/Sex: Female) Treating ROBSON, [name] Primary Care Physician: Beverlyn Roux Physician/Extender: G Referring Physician: Dorthula Nettles in Treatment: 0 Visit Information Patient Arrived: Wheel Chair Arrival Time: 09:58 Accompanied By: husband Transfer Assistance: Manual Patient Identification Verified: Yes Secondary Verification Process Yes Completed: Patient Requires Transmission- No Based Precautions: Patient Has Alerts: Yes Patient Alerts: Plavix, and 371m Aspirin Type II Diabetic Notes Patient used walker to transfer from wheel chair into our chair. Electronic Signature(s) Signed: 08/14/2016 2:01:17 PM By: WGretta Cool RN, BSN, Kim RN, BSN Entered By: WGretta Cool RN, BSN, Kim on 08/13/2016 10:11:37 [name], OCarrolyn White([id number] -------------------------------------------------------------------------------- Clinic Level of Care Assessment Details Patient Name: [name], [name] Date of Service:  08/13/2016 9:30 AM Medical Record Patient Account Number: [id number]: Treating RN: PAhmed Prima4February 22, 1942(76 y.o. Other Clinician: Date of Birth/Sex: Female) Treating ROBSON, [name] Primary Care Physician: ABeverlyn RouxPhysician/Extender: G Referring Physician: ADorthula Nettlesin Treatment: 0 Clinic Level of Care Assessment Items TOOL 1 Quantity Score X - Use when EandM and Procedure is performed on INITIAL visit 1 0 ASSESSMENTS - Nursing Assessment / Reassessment X - General Physical Exam (combine w/ comprehensive assessment (listed just 1 20 below) when performed on new pt. evals) X - Comprehensive Assessment (HX, ROS, Risk Assessments, Wounds Hx, etc.) 1 25 ASSESSMENTS - Wound and Skin Assessment / Reassessment [] - Dermatologic / Skin Assessment (not related to wound area) 0 ASSESSMENTS - Ostomy and/or Continence Assessment and Care [] - Incontinence Assessment and Management 0 [] - Ostomy Care Assessment and Management (repouching, etc.) 0 PROCESS - Coordination of Care [] - Simple Patient / Family Education for ongoing care 0 X - Complex (extensive) Patient / Family Education for ongoing care 1 20 X - Staff obtains CProgrammer, systems Records, Test Results / Process Orders 1 10 X - Staff telephones HHA, Nursing Homes / Clarify orders / etc 1 10 [] - Routine Transfer to another Facility (non-emergent condition) 0 [] - Routine [facility] Admission (non-emergent condition) 0 X - New Admissions / IBiomedical engineer/ Ordering NPWT, Apligraf, etc. 1 15 [] - Emergency [facility] Admission (emergent condition) 0 PROCESS - Special Needs [] - Pediatric / Minor Patient Management 0 Moening, Katryna V. ([id number] [] - Isolation Patient Management 0 [] - Hearing / Language / Visual special needs 0 [] - Assessment of Community assistance (transportation, D/C planning, etc.) 0 [] - Additional assistance / Altered mentation 0 [] - Support Surface(s) Assessment (bed, cushion,  seat, etc.) 0 INTERVENTIONS - Miscellaneous [] - External ear exam 0 X - Patient Transfer (multiple staff / HCivil Service fast streamer/ Similar devices) 1 10 [] - Simple Staple / Suture removal (25 or less) 0 [] - Complex Staple / Suture removal (26 or more) 0 [] -  Hypo/Hyperglycemic Management (do not check if billed separately) 0 [] - Ankle / Brachial Index (ABI) - do not check if billed separately 0 Has the patient been seen at the [facility] within the last three years: Yes Total Score: 110 Level Of Care: New/Established - Level 3 Electronic Signature(s) Signed: 08/13/2016 4:34:43 PM By: Alric Quan Entered By: Alric Quan on 08/13/2016 15:52:56 Craigo, Leylah V. ([id number]) -------------------------------------------------------------------------------- Encounter Discharge Information Details Patient Name: [name], [name] Date of Service: 08/13/2016 9:30 AM Medical Record Patient Account Number: [id number] Number: Treating RN: Ahmed Prima 1941-03-29 (76 y.o. Other Clinician: Date of Birth/Sex: Female) Treating ROBSON, [name] Primary Care Physician: Beverlyn Roux Physician/Extender: G Referring Physician: Dorthula Nettles in Treatment: 0 Encounter Discharge Information Items Discharge Pain Level: 0 Discharge Condition: Stable Ambulatory Status: Wheelchair Discharge Destination: Home Transportation: Private Auto Accompanied By: husband Schedule Follow-up Appointment: Yes Medication Reconciliation completed No and provided to Patient/Care Azara Gemme: Provided on Clinical Summary of Care: 08/13/2016 Form Type Recipient Paper Patient [name] Electronic Signature(s) Signed: 08/13/2016 4:34:43 PM By: Alric Quan Previous Signature: 08/13/2016 11:37:57 AM Version By: Ruthine Dose Entered By: Alric Quan on 08/13/2016 15:54:51 Verastegui, Alexia V. ([id number]) -------------------------------------------------------------------------------- Lower Extremity Assessment  Details Patient Name: White, [name] Date of Service: 08/13/2016 9:30 AM Medical Record Patient Account Number: [id number] Number: Treating RN: Cornell Barman May 26, 1941 (75 y.o. Other Clinician: Date of Birth/Sex: Female) Treating ROBSON, Westfir Primary Care Physician: Beverlyn Roux Physician/Extender: G Referring Physician: Dorthula Nettles in Treatment: 0 Vascular Assessment Claudication: Claudication Assessment [Right:Rest Pain] Pulses: Dorsalis Pedis Palpable: [Right:Yes] Posterior Tibial Palpable: [Right:Yes] Extremity colors, hair growth, and conditions: Extremity Color: [Right:Hyperpigmented] Hair Growth on Extremity: [Right:No] Temperature of Extremity: [Right:Warm] Capillary Refill: [Right:< 3 seconds] Dependent Rubor: [Right:No] Blanched when Elevated: [Right:No] Lipodermatosclerosis: [Right:No] Toe Nail Assessment Left: Right: Thick: Yes Discolored: Yes Deformed: Yes Improper Length and Hygiene: Yes Notes An ABI dt wound placement. Electronic Signature(s) Signed: 08/14/2016 2:01:17 PM By: Gretta Cool, RN, BSN, Kim RN, BSN Entered By: Gretta Cool, RN, BSN, Kim on 08/13/2016 10:45:28 Creppel, Carrolyn White ([id number]) -------------------------------------------------------------------------------- Multi Wound Chart Details Patient Name: [name], [name] Date of Service: 08/13/2016 9:30 AM Medical Record Patient Account Number: [id number] Number: Treating RN: Ahmed Prima Oct 11, 1940 (75 y.o. 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 the Lower Extremity Comorbid History: Cataracts, Chronic sinus Cataracts, Chronic sinus Cataracts, Chronic sinus problems/congestion, problems/congestion, problems/congestion, Congestive Heart Failure, Congestive Heart Failure, Congestive Heart Failure, Hypertension, Type II Hypertension, Type II Hypertension, Type II Diabetes Diabetes Diabetes Date Acquired: 02/05/2016 08/09/2016 07/26/2016 Weeks of Treatment: 0 0 0 Wound Status: Open Open Open Clustered Wound: Yes No No Clustered Quantity: 2 N/A N/A Measurements L x W x D 9.5x1x0.3 4.1x6.2x0.1 0.5x0.3x1 (cm) Area (cm) : 7.461 19.965 0.118 Volume (cm) : 2.238 1.996 0.118 % Reduction in Area: N/A N/A 0.00% % Reduction in Volume: N/A N/A 0.00% Friend, Czarina V. ([id number]) Classification: Grade 2 Grade 2 Grade 2 Exudate Amount: Large Medium Large Exudate Type: Purulent Serous Purulent Exudate Color: yellow, brown, green amber yellow,  brown, green Wound Margin: Flat and Intact Flat and Intact Indistinct, nonvisible Granulation Amount: None Present (0%) Medium (34-66%) N/A Granulation Quality: N/A Red N/A Necrotic Amount: Large (67-100%) Medium (34-66%) N/A Exposed Structures: Fascia: No Fat: Yes Fascia: No Fat: No Fascia: No Fat: No Tendon: No Tendon: No Tendon: No Muscle: No Muscle: No Muscle: No Joint: No Joint: No Joint: No Bone: No Bone: No Bone: No Limited to Skin Limited to Skin Breakdown Breakdown Epithelialization: None None None Debridement: Debridement (16109- Debridement (60454- Debridement ([id number] 11047) 11047) 11047) Pre-procedure 10:52 10:52 10:52 Verification/Time Out Taken: Pain Control: Lidocaine 4% Topical Lidocaine 4% Topical Lidocaine 4% Topical Solution Solution Solution Tissue Debrided: Fibrin/Slough, Exudates,  Fibrin/Slough, Exudates, Fibrin/Slough, Exudates, Subcutaneous Subcutaneous Subcutaneous Level: Skin/Subcutaneous Skin/Subcutaneous Skin/Subcutaneous Tissue Tissue Tissue Debridement Area (sq 9.5 25.42 0.15 cm): Instrument: Other(scoop) Other(scoop) Other(scoop) Bleeding: Minimum Minimum Minimum Hemostasis Achieved: Pressure Pressure Pressure Procedural Pain: 0 0 0 Post Procedural Pain: 0 0 0 Debridement Treatment Procedure was tolerated Procedure was tolerated Procedure was tolerated Response: well well well Post Debridement 9.5x1x0.3 4.1x6.2x0.1 0.5x0.3x1 Measurements L x W x D (cm) Post Debridement 2.238 1.996 0.118 Volume: (cm) Periwound Skin Texture: Scarring: Yes Excoriation: Yes Edema: No Edema: No Edema: No Excoriation: No Excoriation: No Induration: No Induration: No Induration: No Callus: No Callus: No Callus: No Crepitus: No Crepitus: No Crepitus: No Fluctuance: No Fluctuance: No Fluctuance: No Friable: No Friable: No Mandelbaum, Deetra V. ([id number]) Friable: No Rash: No Rash: No Rash: No Scarring: No Scarring: No Periwound Skin Moist: Yes Moist: Yes Maceration: No Moisture: Maceration: No Maceration: No Moist: No Dry/Scaly: No Dry/Scaly: No Dry/Scaly: No Periwound Skin Color: Ecchymosis: Yes Atrophie Blanche: No Atrophie Blanche: No Atrophie Blanche: No Cyanosis: No Cyanosis: No Cyanosis: No Ecchymosis: No Ecchymosis: No Erythema: No Erythema: No Erythema: No Hemosiderin Staining: No Hemosiderin Staining: No Hemosiderin Staining: No Mottled: No Mottled: No Mottled: No Pallor: No Pallor: No Pallor: No Rubor: No Rubor: No Rubor: No Tenderness on No No No Palpation: Wound Preparation: Ulcer Cleansing: Ulcer Cleansing: N/A Rinsed/Irrigated with Rinsed/Irrigated with Saline Saline Topical Anesthetic Topical Anesthetic Applied: Other: lidocaine Applied: Other: lidocaine 4% 4% Assessment Notes: N/A N/A Unable to visualize  wound bed Procedures Performed: Debridement Debridement Debridement Wound Number: 4 N/A N/A Photos: N/A N/A Wound Location: Right Toe Second - N/A N/A Plantar Wounding Event: Trauma N/A N/A Primary Etiology: Diabetic Wound/Ulcer of N/A N/A the Lower Extremity Comorbid History: Cataracts, Chronic sinus N/A N/A problems/congestion, Congestive Heart Failure, Hypertension, Type II Diabetes Date Acquired: 07/26/2016 N/A N/A Weeks of Treatment: 0 N/A N/A Wound Status: Open N/A N/A Clustered Wound: No N/A N/A Clustered Quantity: N/A N/A N/A 2.1x3x0.1 N/A N/A Orner, Kawanna V. ([id number]) Measurements L x W x D (cm) Area (cm) : 4.948 N/A N/A Volume (cm) : 0.495 N/A N/A % Reduction in Area: 0.00% N/A N/A % Reduction in Volume: 0.00% N/A N/A Classification: Grade 0 N/A N/A Exudate Amount: None Present N/A N/A Exudate Type: N/A N/A N/A Exudate Color: N/A N/A N/A Wound Margin: Indistinct, nonvisible N/A N/A Granulation Amount: N/A N/A N/A Granulation Quality: N/A N/A N/A Necrotic Amount: N/A N/A N/A Exposed Structures: Fascia: No N/A N/A Fat: No Tendon: No Muscle: No Joint: No Bone: No Limited to Skin Breakdown Epithelialization: Large (67-100%) N/A N/A Debridement: N/A N/A N/A Pain Control: N/A N/A N/A Tissue Debrided: N/A N/A N/A Level: N/A N/A N/A Debridement Area (sq N/A N/A N/A cm): Instrument: N/A N/A N/A Bleeding: N/A N/A N/A Hemostasis Achieved: N/A N/A N/A  Procedural Pain: N/A N/A N/A Post Procedural Pain: N/A N/A N/A Debridement Treatment N/A N/A N/A Response: Post Debridement N/A N/A N/A Measurements L x W x D (cm) Post Debridement N/A N/A N/A Volume: (cm) Periwound Skin Texture: No Abnormalities Noted N/A N/A Periwound Skin No Abnormalities Noted N/A N/A Moisture: Periwound Skin Color: No Abnormalities Noted N/A N/A Tenderness on No N/A N/A Palpation: Wound Preparation: N/A N/A CHENAE, BRAGER ([id number]) Ulcer Cleansing: Rinsed/Irrigated  with Saline Assessment Notes: N/A N/A N/A Procedures Performed: N/A N/A N/A Treatment Notes Electronic Signature(s) Signed: 08/13/2016 4:24:12 PM By: Linton Ham MD Entered By: Linton Ham on 08/13/2016 12:37:30 Kuhrt, Carrolyn White ([id number]) -------------------------------------------------------------------------------- Multi-Disciplinary Care Plan Details Patient Name: Cortinas, Evalyse V. Date of Service: 08/13/2016 9:30 AM Medical Record Patient Account Number: [id number] Number: Treating RN: Cornell Barman 02/21/1941 (75 y.o. Other Clinician: Date of Birth/Sex: Female) Treating ROBSON, Mangum Primary Care Physician: Beverlyn Roux Physician/Extender: G Referring Physician: Dorthula Nettles in Treatment: 0 Active Inactive Abuse / Safety / Falls / Self Care Management Nursing Diagnoses: Potential for falls Goals: Patient will remain injury free Date Initiated: 08/13/2016 Goal Status: Active Interventions: Assess: immobility, friction, shearing, incontinence upon admission and as needed Assess self care needs on admission and as needed Notes: Nutrition Nursing Diagnoses: Imbalanced nutrition Potential for alteratiion in Nutrition/Potential for imbalanced nutrition Goals: Patient/caregiver agrees to and verbalizes understanding of need to use nutritional supplements and/or vitamins as prescribed Date Initiated: 08/13/2016 Goal Status: Active Interventions: Assess patient nutrition upon admission and as needed per policy Notes: Orientation to the Wound Care Program Nursing Diagnoses: TIRA, LAFFERTY ([id number]) Knowledge deficit related to the wound healing center program Goals: Patient/caregiver will verbalize understanding of the [facility] Date Initiated: 08/13/2016 Goal Status: Active Interventions: Provide education on orientation to the wound center Notes: Pain, Acute or Chronic Nursing Diagnoses: Pain, acute or chronic: actual or  potential Potential alteration in comfort, pain Goals: Patient will verbalize adequate pain control and receive pain control interventions during procedures as needed Date Initiated: 08/13/2016 Goal Status: Active Patient/caregiver will verbalize adequate pain control between visits Date Initiated: 08/13/2016 Goal Status: Active Patient/caregiver will verbalize comfort level met Date Initiated: 08/13/2016 Goal Status: Active Interventions: Assess comfort goal upon admission Complete pain assessment as per visit requirements Notes: Wound/Skin Impairment Nursing Diagnoses: Impaired tissue integrity Goals: Ulcer/skin breakdown will have a volume reduction of 30% by week 4 Date Initiated: 08/13/2016 Goal Status: Active Ulcer/skin breakdown will have a volume reduction of 50% by week 8 Date Initiated: 08/13/2016 CHASTY, RANDAL ([id number]) Goal Status: Active Ulcer/skin breakdown will have a volume reduction of 80% by week 12 Date Initiated: 08/13/2016 Goal Status: Active Interventions: Assess patient/caregiver ability to perform ulcer/skin care regimen upon admission and as needed Assess ulceration(s) every visit Notes: Electronic Signature(s) Signed: 08/13/2016 4:34:43 PM By: Alric Quan Signed: 08/14/2016 2:01:17 PM By: Gretta Cool RN, BSN, Kim RN, BSN Entered By: Alric Quan on 08/13/2016 11:58:32 Sizemore, Unknown V. ([id number]) -------------------------------------------------------------------------------- Pain Assessment Details Patient Name: Domke, Malva V. Date of Service: 08/13/2016 9:30 AM Medical Record Patient Account Number: [id number] Number: Treating RN: Cornell Barman [birth year] (75 y.o. Other Clinician: Date of Birth/Sex: Female) Treating ROBSON, [name] Primary Care Physician: Beverlyn Roux Physician/Extender: G Referring Physician: Dorthula Nettles in Treatment: 0 Active Problems Location of Pain Severity and Description of Pain Patient Has Paino Yes Site  Locations Pain Location: Pain in Ulcers With Dressing Change: No Rate the pain. Current Pain Level: 2 Character of Pain  Describe the Pain: Aching, Tender Pain Management and Medication Current Pain Management: Electronic Signature(s) Signed: 08/14/2016 2:01:17 PM By: Gretta Cool, RN, BSN, Kim RN, BSN Entered By: Gretta Cool, RN, BSN, Kim on 08/13/2016 10:12:00 Dinneen, Carrolyn White ([id number]) -------------------------------------------------------------------------------- Patient/Caregiver Education Details Patient Name: Liebler, Hanae V. Date of Service: 08/13/2016 9:30 AM Medical Record Patient Account Number: [id number] Number: Treating RN: Ahmed Prima [birth year] (75 y.o. Other Clinician: Date of Birth/Gender: Female) Treating ROBSON, [name] Primary Care Physician: Beverlyn Roux Physician/Extender: G Referring Physician: Dorthula Nettles in Treatment: 0 Education Assessment Education Provided To: Patient Education Topics Provided Welcome To [facility]: Handouts: Welcome To [facility] Methods: Explain/Verbal Responses: State content correctly Wound/Skin Impairment: Handouts: Other: do not get wrap wet Methods: Explain/Verbal Responses: State content correctly Electronic Signature(s) Signed: 08/13/2016 4:34:43 PM By: Alric Quan Entered By: Alric Quan on 08/13/2016 15:55:18 Sensabaugh, Jordain V. ([id number]) -------------------------------------------------------------------------------- Wound Assessment Details Patient Name: Stiggers, Delphine V. Date of Service: 08/13/2016 9:30 AM Medical Record Patient Account Number: [id number] Number: Treating RN: Cornell Barman 19-Sep-1940 (75 y.o. Other Clinician: Date of Birth/Sex: Female) Treating ROBSON, [name] Primary Care Physician: Beverlyn Roux Physician/Extender: G Referring Physician: Dorthula Nettles in Treatment: 0 Wound Status Wound Number: 1 Primary Diabetic Wound/Ulcer of the  Lower Etiology: Extremity Wound Location: Right Lower Leg Wound Open Wounding Event: Surgical Injury Status: Date Acquired: 02/05/2016 Comorbid Cataracts, Chronic sinus Weeks Of Treatment: 0 History: problems/congestion, Congestive Heart Clustered Wound: Yes Failure, Hypertension, Type II Diabetes Photos Wound Measurements Length: (cm) 9.5 % Reduction in Are Width: (cm) 1 % Reduction in Vol Depth: (cm) 0.3 Epithelialization: Clustered Quantity: 2 Tunneling: Area: (cm) 7.461 Undermining: Volume: (cm) 2.238 a: ume: None No No Wound Description Classification: Grade 2 Foul Odor After Cl Wound Margin: Flat and Intact Exudate Amount: Large Exudate Type: Purulent Exudate Color: yellow, brown, green eansing: No Wound Bed Granulation Amount: None Present (0%) Exposed Structure Necrotic Amount: Large (67-100%) Fascia Exposed: No Finlay, Izabelle V. ([id number]) Necrotic Quality: Adherent Slough Fat Layer Exposed: No Tendon Exposed: No Muscle Exposed: No Joint Exposed: No Bone Exposed: No Limited to Skin Breakdown Periwound Skin Texture Texture Color No Abnormalities Noted: No No Abnormalities Noted: No Callus: No Atrophie Blanche: No Crepitus: No Cyanosis: No Excoriation: No Ecchymosis: Yes Fluctuance: No Erythema: No Friable: No Hemosiderin Staining: No Induration: No Mottled: No Localized Edema: No Pallor: No Rash: No Rubor: No Scarring: Yes Moisture No Abnormalities Noted: No Dry / Scaly: No Maceration: No Moist: Yes Wound Preparation Ulcer Cleansing: Rinsed/Irrigated with Saline Topical Anesthetic Applied: Other: lidocaine 4%, Treatment Notes Wound #1 (Right Lower Leg) 1. Cleansed with: Clean wound with Normal Saline Cleanse wound with antibacterial soap and water 2. Anesthetic Topical Lidocaine 4% cream to wound bed prior to debridement 4. Dressing Applied: Aquacel Ag 5. Secondary Dressing Applied ABD Pad 7. Secured with Tape 3 Layer  Compression System - Right Lower Extremity Electronic Signature(s) Signed: 08/14/2016 2:01:17 PM By: Gretta Cool, RN, BSN, Kim RN, BSN Ronan, Porchea V. ([id number]) Entered By: Gretta Cool RN, BSN, Kim on 08/13/2016 10:38:16 Westerfeld, Amiri [name] ([id number]) -------------------------------------------------------------------------------- Wound Assessment Details Patient Name: [name], [name] Date of Service: 08/13/2016 9:30 AM Medical Record Patient Account Number: [id number] Number: Treating RN: Cornell Barman 23-Apr-1941 (75 y.o. Other Clinician: Date of Birth/Sex: Female) Treating ROBSON, [name] Primary Care Physician: Beverlyn Roux Physician/Extender: G Referring Physician: Dorthula Nettles in Treatment: 0 Wound Status Wound Number: 2 Primary Diabetic Wound/Ulcer of the Lower Etiology: Extremity Wound Location: Right  Lower Leg - Medial, Distal Wound Open Status: Wounding Event: Blister Comorbid Cataracts, Chronic sinus Date Acquired: 08/09/2016 History: problems/congestion, Congestive Heart Weeks Of Treatment: 0 Failure, Hypertension, Type II Diabetes Clustered Wound: No Photos Wound Measurements Length: (cm) 4.1 Width: (cm) 6.2 Depth: (cm) 0.1 Area: (cm) 19.965 Volume: (cm) 1.996 % Reduction in Area: % Reduction in Volume: Epithelialization: None Tunneling: No Undermining: No Wound Description Classification: Grade 2 Foul Odor After Wound Margin: Flat and Intact Exudate Amount: Medium Exudate Type: Serous Exudate Color: amber Cleansing: No Wound Bed Granulation Amount: Medium (34-66%) Exposed Structure Granulation Quality: Red Fascia Exposed: No Necrotic Amount: Medium (34-66%) Fat Layer Exposed: Yes Imler, Johnsie V. ([id number]) Necrotic Quality: Adherent Slough Tendon Exposed: No Muscle Exposed: No Joint Exposed: No Bone Exposed: No Periwound Skin Texture Texture Color No Abnormalities Noted: No No Abnormalities Noted: No Callus: No Atrophie Blanche:  No Crepitus: No Cyanosis: No Excoriation: Yes Ecchymosis: No Fluctuance: No Erythema: No Friable: No Hemosiderin Staining: No Induration: No Mottled: No Localized Edema: No Pallor: No Rash: No Rubor: No Scarring: No Moisture No Abnormalities Noted: No Dry / Scaly: No Maceration: No Moist: Yes Wound Preparation Ulcer Cleansing: Rinsed/Irrigated with Saline Topical Anesthetic Applied: Other: lidocaine 4%, Treatment Notes Wound #2 (Right, Distal, Medial Lower Leg) 1. Cleansed with: Clean wound with Normal Saline Cleanse wound with antibacterial soap and water 2. Anesthetic Topical Lidocaine 4% cream to wound bed prior to debridement 4. Dressing Applied: Aquacel Ag 5. Secondary Dressing Applied ABD Pad 7. Secured with Tape 3 Layer Compression System - Right Lower Extremity Electronic Signature(s) Signed: 08/14/2016 2:01:17 PM By: Gretta Cool, RN, BSN, Kim RN, BSN Entered By: Gretta Cool, RN, BSN, Kim on 08/13/2016 10:41:49 Carton, SKYLYNNE SCHLECHTER ([id number]) Schafer, Carrolyn [facility] ([id number]) -------------------------------------------------------------------------------- Wound Assessment Details Patient Name: [name], [name] Date of Service: 08/13/2016 9:30 AM Medical Record Patient Account Number: [id number] Number: Treating RN: Cornell Barman 01-27-1941 (75 y.o. 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 ([id number]) Tendon Exposed: No Muscle Exposed: No Joint Exposed: No Bone Exposed: No Limited to Skin Breakdown Periwound Skin Texture Texture Color No Abnormalities Noted: No No Abnormalities Noted: No Callus: No Atrophie Blanche: No Crepitus: No Cyanosis: No Excoriation: No Ecchymosis: No Fluctuance: No Erythema: No Friable: No Hemosiderin Staining: No Induration: No Mottled: No Localized Edema: No Pallor: No Rash: No Rubor: No Scarring: No Moisture No Abnormalities Noted: No Dry / Scaly: No Maceration: No Moist: No Assessment Notes Unable to visualize wound bed Treatment Notes Wound #3 (Right, Dorsal Toe Great) 1. Cleansed with: Clean wound with Normal Saline Cleanse wound with antibacterial soap and water 2. Anesthetic Topical Lidocaine 4% cream to wound bed prior to debridement 4. Dressing  Applied: Aquacel Ag 5. Secondary Dressing Applied ABD Pad 7. Secured with Tape 3 Layer Compression System - Right Lower Extremity Electronic Signature(s) Signed: 08/14/2016 2:01:17 PM By: Gretta Cool, RN, BSN, Kim RN, BSN Entered By: Gretta Cool, RN, BSN, Kim on 08/13/2016 10:58:06 Coupe, Liann [name] ([id number]) -------------------------------------------------------------------------------- Wound Assessment Details Patient Name: [name], [name] Date of Service: 08/13/2016 9:30 AM Medical Record Patient Account Number: [id number] Number: Treating RN: Cornell Barman 14-Aug-1940 (75 y.o. 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 ([id number]) Joint Exposed: No Bone Exposed: No Limited to Skin Breakdown Periwound Skin Texture Texture Color No Abnormalities Noted: No No Abnormalities Noted: No Moisture No Abnormalities Noted: No Wound Preparation Ulcer Cleansing: Rinsed/Irrigated with Saline Treatment Notes Wound #4 (Right, Plantar Toe Second) 1. Cleansed with: Clean wound with Normal Saline Cleanse wound with antibacterial soap and water 2. Anesthetic Topical Lidocaine 4% cream to wound bed prior to debridement 4. Dressing Applied: Foam Notes band-aide Electronic Signature(s) Signed: 08/14/2016 2:01:17 PM By: Gretta Cool, RN, BSN, Kim RN, BSN Entered By: Gretta Cool, RN, BSN, Kim on 08/13/2016 10:57:38 Sayegh, Carrolyn White ([id number]) -------------------------------------------------------------------------------- Vitals Details Patient Name: [name], [name] Date of Service: 08/13/2016 9:30 AM Medical Record Patient Account Number:  [id number] Number: Treating RN: Cornell Barman 20-Jan-1941 (75 y.o. Other Clinician: Date of Birth/Sex: Female) Treating ROBSON, [name] Primary Care Physician: Beverlyn Roux Physician/Extender: G Referring Physician: Dorthula Nettles in Treatment: 0 Vital Signs Time Taken: 10:12 Temperature (F): 97.6 Height (in): 64 Pulse (bpm): 73 Source: Stated Respiratory Rate (breaths/min): 16 Weight (lbs): 216 Blood Pressure (mmHg): 172/55 Source: Stated Reference Range: 80 - 120 mg / dl Body Mass Index (BMI): 37.1 Electronic Signature(s) Signed: 08/14/2016 2:01:17 PM By: Gretta Cool, RN, BSN, Kim RN, BSN Entered By: Gretta Cool, RN, BSN, Kim on 08/13/2016 10:13:00

## 2016-08-15 ENCOUNTER — Telehealth: Payer: Self-pay | Admitting: *Deleted

## 2016-08-15 NOTE — Progress Notes (Signed)
SHERE, GENTHE ([id number]) Visit Report for 08/13/2016 Chief Complaint Document Details Patient Name: [name], [name] Date of Service: 08/13/2016 9:30 AM Medical Record Patient Account Number: [id number] Number: Treating RN: Ahmed Prima 1941-02-12 (76 y.o. Other Clinician: Date of Birth/Sex: Female) Treating Lular Letson Primary Care Physician/Extender: Charna Busman Physician: Referring Physician: Dorthula Nettles in Treatment: 0 Information Obtained from: Patient Chief Complaint 08/13/16; patient is here for review of predominantly a wound on her right anterior lateral lower calf whenever our intake reveals a more superior right calf wound and 2 small open areas over the right great toe Electronic Signature(s) Signed: 08/13/2016 4:24:12 PM By: Linton Ham MD Entered By: Linton Ham on 08/13/2016 12:39:05 Dingus, Matrice [name] ([id number]) -------------------------------------------------------------------------------- Debridement Details Patient Name: White, [name] Date of Service: 08/13/2016 9:30 AM Medical Record Patient Account Number: [id number] Number: Treating RN: Ahmed Prima 1941-02-16 (76 y.o. Other Clinician: Date of Birth/Sex: Female) Treating Anvith Mauriello Primary Care Physician/Extender: Charna Busman Physician: Referring Physician: Dorthula Nettles in Treatment: 0 Debridement Performed for Wound #1 Right Lower Leg Assessment: Performed By: Physician Ricard Dillon, MD Debridement: Debridement Pre-procedure Yes - 10:52 Verification/Time Out Taken: Start Time: 11:54 Pain Control: Lidocaine 4% Topical Solution Level: Skin/Subcutaneous Tissue Total Area Debrided (L x 9.5 (cm) x 1 (cm) = 9.5 (cm) W): Tissue and other Viable, Non-Viable, Exudate, Fibrin/Slough, Subcutaneous material debrided: Instrument: Other : scoop Bleeding: Minimum Hemostasis Achieved: Pressure End Time: 11:56 Procedural Pain: 0 Post Procedural  Pain: 0 Response to Treatment: Procedure was tolerated well Post Debridement Measurements of Total Wound Length: (cm) 9.5 Width: (cm) 1 Depth: (cm) 0.3 Volume: (cm) 2.238 Character of Wound/Ulcer Post Requires Further Debridement Debridement: Severity of Tissue Post Debridement: Fat layer exposed Post Procedure Diagnosis Same as Pre-procedure Electronic Signature(s) Signed: 08/13/2016 4:24:12 PM By: Linton Ham MD Roughton, [name] ([id number]) Signed: 08/13/2016 4:34:43 PM By: Alric Quan Entered By: Linton Ham on 08/13/2016 12:37:46 Leverett, Jenette V. ([id number]) -------------------------------------------------------------------------------- Debridement Details Patient Name: White, [name] Date of Service: 08/13/2016 9:30 AM Medical Record Patient Account Number: [id number] Number: Treating RN: Ahmed Prima 02-15-1941 (76 y.o. Other Clinician: Date of Birth/Sex: Female) Treating Rilynne Lonsway Primary Care Physician/Extender: Charna Busman Physician: Referring Physician: Dorthula Nettles in Treatment: 0 Debridement Performed for Wound #2 Right,Distal,Medial Lower Leg Assessment: Performed By: Physician Ricard Dillon, MD Debridement: Debridement Pre-procedure Yes - 10:52 Verification/Time Out Taken: Start Time: 10:53 Pain Control: Lidocaine 4% Topical Solution Level: Skin/Subcutaneous Tissue Total Area Debrided (L x 4.1 (cm) x 6.2 (cm) = 25.42 (cm) W): Tissue and other Viable, Non-Viable, Exudate, Fibrin/Slough, Subcutaneous material debrided: Instrument: Other : scoop Bleeding: Minimum Hemostasis Achieved: Pressure End Time: 11:54 Procedural Pain: 0 Post Procedural Pain: 0 Response to Treatment: Procedure was tolerated well Post Debridement Measurements of Total Wound Length: (cm) 4.1 Width: (cm) 6.2 Depth: (cm) 0.1 Volume: (cm) 1.996 Character of Wound/Ulcer Post Requires Further Debridement Debridement: Severity of  Tissue Post Debridement: Fat layer exposed Post Procedure Diagnosis Same as Pre-procedure Electronic Signature(s) Signed: 08/13/2016 4:24:12 PM By: Linton Ham MD Baney, [name] ([id number]) Signed: 08/13/2016 4:34:43 PM By: Alric Quan Entered By: Linton Ham on 08/13/2016 12:37:58 Fogelman, Tylisha V. ([id number]) -------------------------------------------------------------------------------- Debridement Details Patient Name: White, [name] Date of Service: 08/13/2016 9:30 AM Medical Record Patient Account Number: [id number] Number: Treating RN: Ahmed Prima 28-Jul-1941 (76 y.o. Other Clinician: Date of Birth/Sex: Female) Treating Johneisha Broaden Primary Care Physician/Extender: Charna Busman Physician: Referring Physician: Sherril Cong,  ELENA Weeks in Treatment: 0 Debridement Performed for Wound #3 Right,Dorsal Toe Great Assessment: Performed By: Physician Ricard Dillon, MD Debridement: Debridement Pre-procedure Yes - 10:52 Verification/Time Out Taken: Start Time: 11:54 Pain Control: Lidocaine 4% Topical Solution Level: Skin/Subcutaneous Tissue Total Area Debrided (L x 0.5 (cm) x 0.3 (cm) = 0.15 (cm) W): Tissue and other Viable, Non-Viable, Exudate, Fibrin/Slough, Subcutaneous material debrided: Instrument: Other : scoop Bleeding: Minimum Hemostasis Achieved: Pressure End Time: 11:56 Procedural Pain: 0 Post Procedural Pain: 0 Response to Treatment: Procedure was tolerated well Post Debridement Measurements of Total Wound Length: (cm) 0.5 Width: (cm) 0.3 Depth: (cm) 1 Volume: (cm) 0.118 Character of Wound/Ulcer Post Requires Further Debridement Debridement: Severity of Tissue Post Debridement: Fat layer exposed Post Procedure Diagnosis Same as Pre-procedure Electronic Signature(s) Signed: 08/13/2016 4:24:12 PM By: Linton Ham MD Windt, [name] ([id number]) Signed: 08/13/2016 4:34:43 PM By: Alric Quan Entered By: Linton Ham on  08/13/2016 12:38:12 Ostroff, Shemeca V. ([id number]) -------------------------------------------------------------------------------- HPI Details Patient Name: White, [name] Date of Service: 08/13/2016 9:30 AM Medical Record Patient Account Number: [id number] Number: Treating RN: Ahmed Prima Aug 21, 1940 (75 y.o. Other Clinician: Date of Birth/Sex: Female) Treating Envi Eagleson Primary Care Physician/Extender: Charna Busman Physician: Referring Physician: Dorthula Nettles in Treatment: 0 History of Present Illness HPI Description: 08/13/16: This is a 76 year old woman who came predominantly for review of 3 cm in diameter circular wound to the left anterior lateral leg. She was in the ER on 08/01/16 I reviewed their notes. There was apparently pus coming out of the wound at that time and the patient arrived requesting debridement which they don't do in the emergency room. Nevertheless I can't see that they did any x-rays. There were no cultures done. She is a type II diabetic and I a note after the patient was in the clinic that she had a bypass graft from the popliteal to the tibial on the right on 02/28/16. She also had a right greater saphenous vein harvest on the same date for arterial bypass. She is going to have vascular studies including ABIs T ABIs on the right on 08/28/16. The patient's surgery was on 02/28/16 by Dr. Vallarie Mare she had a right below the knee popliteal artery to peroneal artery bypass with reverse greater saphenous vein and an endarterectomy of the mid segment peroneal artery. Postoperatively she had a strong mild monophasic peroneal signal with a pink foot. It would appear that the patient is had some nonhealing in the surgical saphenous vein harvest site on the left leg. Surprisingly [facility] I cannot see much information about this at all. Dr. Lucious Groves notes from 05/29/16 show that the patient's wounds "are not healed" the right first  metatarsal wound healed but then opened back up. 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 At that point the patient had a wound VAC to her right calf with regards to the wounds on her dorsal right toe would appear that these are felt to be arterial wounds. She has had surgery on the metatarsal phalangeal in 2015 I Dr. Doran Durand secondary to a right metatarsal phalangeal joint fracture. She is apparently had discoloration around this area since then. Electronic Signature(s) Signed: 08/13/2016 4:24:12 PM By: Linton Ham MD Entered By: Linton Ham on 08/13/2016 12:52:09 Goleman, Shaneta [name] ([id number]) -------------------------------------------------------------------------------- Physical Exam Details Patient Name: Soloway, Stpehanie V. Date of Service: 08/13/2016 9:30 AM Medical Record Patient Account  Number: [id number] Number: Treating RN: Ahmed Prima 24-Aug-1940 (75 y.o. Other Clinician: Date of Birth/Sex: Female) Treating Jaliana Medellin Primary Care Physician/Extender: Charna Busman Physician: Referring Physician: Dorthula Nettles in Treatment: 0 Constitutional Patient is hypertensive.. Pulse regular and within target range for patient.[name] Respirations regular, non-labored and within target range.. Temperature is normal and within the target range for the patient.. Patient appears frail but is able to to participate in the examination.[name] Respiratory Respiratory effort is easy and symmetric bilaterally. Rate is normal at rest and on room air.. Bilateral breath sounds are clear and equal in all lobes with no wheezes, rales or rhonchi.. Cardiovascular Heart rhythm and rate regular, without murmur or gallop.. Femoral arteries without bruits and pulses strong.. Pedal pulses absent bilaterally.. Lymphatic None palpable in the popliteal or inguinal area. Musculoskeletal The  dorsal aspect of her right first metatarsal phalangeal joint is darkened however husband states this is chronic. There is no tenderness no effusion she also has a dark second toe.[name] Neurological Absent vibration sense. Notes Wound exam; #1 the patient has 2 open areas in the surgical scar that was through saphenous vein harvest site. Both of these wounds had a necrotic surface of the required debridement with a #3 curet these clean up quite nicely although there is weeping edema coming out of both wound areas. #2 the blister on the anterior left leg was also debrided with a #3 curet hold is also has weeping edema fluid. #3 the patient has 2 small areas on the dorsal aspect of the right great toe the deepest of these at the metatarsal phalangeal joint. I also cleaned up this area. With a #3 curet and #4 no evidence of infection at any site Electronic Signature(s) Signed: 08/13/2016 4:24:12 PM By: Linton Ham MD Entered By: Linton Ham on 08/13/2016 13:29:36 Mongeau, Shereka [name] ([id number]) -------------------------------------------------------------------------------- Physician Orders Details Patient Name: Manternach, Raylin V. Date of Service: 08/13/2016 9:30 AM Medical Record Patient Account Number: [id number] Number: Treating RN: Cornell Barman [birth year] (75 y.o. Other Clinician: Date of Birth/Sex: Female) Treating Jarelis Ehlert Primary Care Physician/Extender: Charna Busman Physician: Referring Physician: Dorthula Nettles in Treatment: 0 Verbal / Phone Orders: Yes Clinician: Cornell Barman Read Back and Verified: Yes Diagnosis Coding Wound Cleansing Wound #1 Right Lower Leg o Clean wound with Normal Saline. o Cleanse wound with mild soap and water Wound #2 Right,Distal,Medial Lower Leg o Clean wound with Normal Saline. o Cleanse wound with mild soap and water Wound #3 Right,Dorsal Toe Great o Clean wound with Normal Saline. o Cleanse wound with mild soap and  water Wound #4 Right,Plantar Toe Second o Clean wound with Normal Saline. o Cleanse wound with mild soap and water Anesthetic Wound #1 Right Lower Leg o Topical Lidocaine 4% cream applied to wound bed prior to debridement - for clinic use Wound #2 Right,Distal,Medial Lower Leg o Topical Lidocaine 4% cream applied to wound bed prior to debridement - for clinic use Wound #3 Right,Dorsal Toe Great o Topical Lidocaine 4% cream applied to wound bed prior to debridement - for clinic use Wound #4 Right,Plantar Toe Second o Topical Lidocaine 4% cream applied to wound bed prior to debridement - for clinic use Primary Wound Dressing Wound #1 Right Lower Leg o Aquacel Ag Parfait, Julya V. ([id number]) Wound #2 Right,Distal,Medial Lower Leg o Aquacel Ag Wound #3 Right,Dorsal Toe Great o Aquacel Ag Secondary Dressing Wound #1 Right Lower Leg o ABD pad Wound #2 Right,Distal,Medial Lower Leg o ABD pad Wound #  3 Right,Dorsal Toe Great o ABD pad Wound #4 Right,Plantar Toe Second o Foam - foam for the second plantar toe and to pad the right heel under wrap o Other - band-aide Dressing Change Frequency Wound #1 Right Lower Leg o Three times weekly - HHRN to change on Mondays and Fridays and pt will come to wound care clinic on Wednesdays. o Other: - Pt needs to be seen tomorrow Wednesday 08/14/16 as earlier as possible. Wound #2 Right,Distal,Medial Lower Leg o Three times weekly - HHRN to change on Mondays and Fridays and pt will come to wound care clinic on Wednesdays. o Other: - Pt needs to be seen tomorrow Wednesday 08/14/16 as earlier as possible. Wound #3 Right,Dorsal Toe Great o Three times weekly - HHRN to change on Mondays and Fridays and pt will come to wound care clinic on Wednesdays. o Other: - Pt needs to be seen tomorrow Wednesday 08/14/16 as earlier as possible. Wound #4 Right,Plantar Toe Second o Three times weekly - HHRN to change on  Mondays and Fridays and pt will come to wound care clinic on Wednesdays. o Other: - Pt needs to be seen tomorrow Wednesday 08/14/16 as earlier as possible. Follow-up Appointments Wound #1 Right Lower Leg o Return Appointment in 1 week. Wound #2 Right,Distal,Medial Lower Leg o Return Appointment in 1 week. Munger, Lenoria V. ([id number]) Wound #3 Right,Dorsal Toe Great o Return Appointment in 1 week. Edema Control Wound #1 Right Lower Leg o Kerlix and Coban - Right Lower Extremity - Please remove the 3-layer wrap and apply the kerlix / coban wrap lightly to right leg. Wound #2 Right,Distal,Medial Lower Leg o Kerlix and Coban - Right Lower Extremity - Please remove the 3-layer wrap and apply the kerlix / coban wrap lightly to right leg. Wound #3 Right,Dorsal Toe Great o Kerlix and Coban - Right Lower Extremity - Please remove the 3-layer wrap and apply the kerlix / coban wrap lightly to right leg. Wound #4 Right,Plantar Toe Second o Kerlix and Coban - Right Lower Extremity - Please remove the 3-layer wrap and apply the kerlix / coban wrap lightly to right leg. Additional Orders / Instructions Wound #1 Right Lower Leg o Increase protein intake. Wound #2 Right,Distal,Medial Lower Leg o Increase protein intake. Wound #3 Right,Dorsal Toe Great o Increase protein intake. Wound #4 Right,Plantar Toe Second o Increase protein intake. Home Health Wound #1 Right Lower Leg o [place] [facility] o Home Health Nurse may visit PRN to address patientos wound care needs. o FACE TO FACE ENCOUNTER: MEDICARE and MEDICAID PATIENTS: I certify that this patient is under my care and that I had a face-to-face encounter that meets the physician face-to-face encounter requirements with this patient on this date. The encounter with the patient was in whole or in part for the following MEDICAL CONDITION: (primary reason for [place]) MEDICAL NECESSITY: I  certify, that based on my findings, NURSING services are a medically necessary home health service. HOME BOUND STATUS: I certify that my clinical findings support that this patient is homebound (i.e., Due to illness or injury, pt requires aid of supportive devices such as crutches, cane, wheelchairs, walkers, the use of special transportation or the assistance of another person to leave their place of residence. There is a normal inability to leave the home and doing so requires considerable and taxing effort. Other [place], Nalany V. ([id number]) absences are for medical reasons / religious services and are infrequent or of short duration when for  other reasons). o If current dressing causes regression in wound condition, may D/C ordered dressing product/s and apply Normal Saline Moist Dressing daily until next [place] / Other MD appointment. [place] of regression in wound condition at [phone]. o Please direct any NON-WOUND related issues/requests for orders to patient's Primary Care Physician Wound #2 Right,Distal,Medial Lower Leg o [place] Nurse may visit PRN to address patientos wound care needs. o FACE TO FACE ENCOUNTER: MEDICARE and MEDICAID PATIENTS: I certify that this patient is under my care and that I had a face-to-face encounter that meets the physician face-to-face encounter requirements with this patient on this date. The encounter with the patient was in whole or in part for the following MEDICAL CONDITION: (primary reason for [place]) MEDICAL NECESSITY: I certify, that based on my findings, NURSING services are a medically necessary home health service. HOME BOUND STATUS: I certify that my clinical findings support that this patient is homebound (i.e., Due to illness or injury, pt requires aid of supportive devices such as crutches, cane, wheelchairs, walkers, the use of  special transportation or the assistance of another person to leave their place of residence. There is a normal inability to leave the home and doing so requires considerable and taxing effort. Other absences are for medical reasons / religious services and are infrequent or of short duration when for other reasons). o If current dressing causes regression in wound condition, may D/C ordered dressing product/s and apply Normal Saline Moist Dressing daily until next [place] / Other MD appointment. [place] of regression in wound condition at [phone]. o Please direct any NON-WOUND related issues/requests for orders to patient's Primary Care Physician Wound #3 Right,Dorsal Toe [place] Nurse may visit PRN to address patientos wound care needs. o FACE TO FACE ENCOUNTER: MEDICARE and MEDICAID PATIENTS: I certify that this patient is under my care and that I had a face-to-face encounter that meets the physician face-to-face encounter requirements with this patient on this date. The encounter with the patient was in whole or in part for the following MEDICAL CONDITION: (primary reason for [place]) MEDICAL NECESSITY: I certify, that based on my findings, NURSING services are a medically necessary home health service. HOME BOUND STATUS: I certify that my clinical findings support that this patient is homebound (i.e., Due to illness or injury, pt requires aid of supportive devices such as crutches, cane, wheelchairs, walkers, the use of special transportation or the assistance of another person to leave their place of residence. There is a normal inability to leave the home and doing so requires considerable and taxing effort. Other absences are for medical reasons / religious services and are infrequent or of short duration when for other reasons). o If current dressing causes regression in  wound condition, may D/C ordered dressing product/s and apply Normal Saline Moist Dressing daily until next [place] / Other MD appointment. [place] of regression in wound condition at [phone]. o Please direct any NON-WOUND related issues/requests for orders to patient's Primary Care Physician NAVEAH, NEVEU ([id number]) Wound #4 [place] Nurse may visit PRN to address patientos wound care needs. o FACE TO FACE ENCOUNTER: MEDICARE and MEDICAID PATIENTS: I certify that this patient is under my care and that I had a face-to-face  encounter that meets the physician face-to-face encounter requirements with this patient on this date. The encounter with the patient was in whole or in part for the following MEDICAL CONDITION: (primary reason for [place]) MEDICAL NECESSITY: I certify, that based on my findings, NURSING services are a medically necessary home health service. HOME BOUND STATUS: I certify that my clinical findings support that this patient is homebound (i.e., Due to illness or injury, pt requires aid of supportive devices such as crutches, cane, wheelchairs, walkers, the use of special transportation or the assistance of another person to leave their place of residence. There is a normal inability to leave the home and doing so requires considerable and taxing effort. Other absences are for medical reasons / religious services and are infrequent or of short duration when for other reasons). o If current dressing causes regression in wound condition, may D/C ordered dressing product/s and apply Normal Saline Moist Dressing daily until next [place] / Other MD appointment. [place] of regression in wound condition at [phone]. o Please direct any NON-WOUND related issues/requests for orders to patient's Primary  Care Physician Electronic Signature(s) Signed: 08/13/2016 4:24:12 PM By: Linton Ham MD Signed: 08/13/2016 4:34:43 PM By: Alric Quan Entered By: Alric Quan on 08/13/2016 13:49:41 Heiss, Aftan V. ([id number]) -------------------------------------------------------------------------------- Problem List Details Patient Name: Muhammed, Mackinze V. Date of Service: 08/13/2016 9:30 AM Medical Record Patient Account Number: [id number] Number: Treating RN: Ahmed Prima Jun 17, 1941 (75 y.o. 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 Date of Service: 08/13/2016 9:30 AM Medical Record Patient Account Number: [id number] Number: Treating RN: Ahmed Prima [birth year] (75 y.o. Other Clinician: Date of Birth/Sex: Female) Treating Christne Platts Primary Care Physician/Extender: Charna Busman Physician: Referring Physician: Dorthula Nettles in Treatment: 0 Subjective Chief  Complaint Information obtained from Patient 08/13/16; patient is here for review of predominantly a wound on her right anterior lateral lower calf whenever our intake reveals a more superior right calf wound and 2 small open areas over the right great toe History of Present Illness (HPI) 08/13/16: This is a 76 year old woman who came predominantly for review of 3 cm in diameter circular wound to the left anterior lateral leg. She was in the ER on 08/01/16 I reviewed their notes. There was apparently pus coming out of the wound at that time and the patient arrived requesting debridement which they don't do in the emergency room. Nevertheless I can't see that they did any x-rays. There were no cultures done. She is a type II diabetic and I a note after the patient was in the clinic that she had a bypass graft from the popliteal to the tibial on the right on 02/28/16. She also had a right greater saphenous vein harvest on  the same date for arterial bypass. She is going to have vascular studies including ABIs T ABIs on the right on 08/28/16. The patient's surgery was on 02/28/16 by Dr. Vallarie Mare she had a right below the knee popliteal artery to peroneal artery bypass with reverse greater saphenous vein and an endarterectomy of the mid segment peroneal artery. Postoperatively she had a strong mild monophasic peroneal signal with a pink foot. It would appear that the patient is had some nonhealing in the surgical saphenous vein harvest site on the left leg. Surprisingly looking through [facility] I cannot see much information about this at all. Dr. Lucious Groves notes from 05/29/16 show that the patient's wounds "are not healed" the right first metatarsal wound healed but then opened back up. 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 At that point the patient had a wound VAC to her right calf with regards to the  wounds on her dorsal right toe would appear that these are felt to be arterial wounds. She has had surgery on the metatarsal phalangeal in 2015 I Dr. Doran Durand secondary to a right metatarsal phalangeal joint fracture. She is apparently had discoloration around this area since then. Wound History Patient presents with 3 open wounds that have been present for approximately 6 months. Patient has been treating wounds in the following manner: Nurse changes. Laboratory tests have not been performed in the last month. Patient reportedly has not tested positive for an antibiotic resistant organism. Patient reportedly has had testing performed to evaluate circulation in the legs. Patient experiences the following problems associated with their wounds: infection. ADAN, DECKARD ([id number]) Patient History Information obtained from Patient, Caregiver, Chart. Allergies penicillin, sulfamethizole, iodine Social History Never smoker, Marital Status - Married, Alcohol Use - Never, Drug Use - No History, Caffeine Use - Daily. Medical History Eyes Patient has history of Cataracts - removed Denies history of Glaucoma, Optic Neuritis Ear/Nose/Mouth/Throat Patient has history of Chronic sinus problems/congestion Denies history of Middle ear problems Hematologic/Lymphatic Denies history of Anemia, Hemophilia, Human Immunodeficiency Virus, Lymphedema, Sickle Cell Disease Respiratory Denies history of Aspiration, Asthma, Chronic Obstructive Pulmonary Disease (COPD), Pneumothorax, Sleep Apnea, Tuberculosis Cardiovascular Patient has history of Congestive Heart Failure, Hypertension Denies history of Angina, Arrhythmia, Coronary Artery Disease, Deep Vein Thrombosis, Hypotension, Myocardial Infarction, Peripheral Arterial Disease, Peripheral Venous Disease, Phlebitis Gastrointestinal Denies history of Cirrhosis , Colitis, Crohn s, Hepatitis A, Hepatitis B, Hepatitis C Endocrine Patient has history of Type II  Diabetes Denies history of Type I Diabetes Genitourinary Denies history of End Stage Renal Disease Immunological Denies history of Lupus Erythematosus, Raynaud s, Scleroderma Integumentary (Skin) Denies history of History of Burn, History of pressure wounds Musculoskeletal Denies history of Gout, Rheumatoid Arthritis, Osteoarthritis, Osteomyelitis Neurologic Denies history of Dementia, Neuropathy, Quadriplegia, Paraplegia, Seizure Disorder Oncologic Denies history of Received Chemotherapy, Received Radiation Psychiatric Denies history of Anorexia/bulimia, Confinement Anxiety Patient is treated with Insulin. Blood sugar is tested. Blood sugar results noted at the following times: Breakfast - 325. Medical And Surgical History Notes Etzler, KALAYAH MAZZARESE ([id number]) Cardiovascular Stroke 2017 Review of Systems (ROS) Constitutional Symptoms (General Health) Denies complaints or symptoms of Fatigue, Fever, Chills, Marked Weight Change. Eyes Complains or has symptoms of Vision Changes. Denies complaints or symptoms of Dry Eyes, Glasses / Contacts. Ear/Nose/Mouth/Throat The patient has no complaints or symptoms. Hematologic/Lymphatic The patient has no complaints or symptoms. Respiratory The patient has no complaints or symptoms. Cardiovascular Complains  or has symptoms of LE edema. Denies complaints or symptoms of Chest pain. Gastrointestinal Denies complaints or symptoms of Frequent diarrhea, Nausea, Vomiting. Endocrine Denies complaints or symptoms of Hepatitis, Thyroid disease, Polydypsia (Excessive Thirst). Genitourinary The patient has no complaints or symptoms. Immunological The patient has no complaints or symptoms. Integumentary (Skin) Complains or has symptoms of Wounds. Denies complaints or symptoms of Bleeding or bruising tendency, Breakdown, Swelling. Musculoskeletal Denies complaints or symptoms of Muscle Pain. Neurologic The patient has no complaints or  symptoms. Oncologic The patient has no complaints or symptoms. Psychiatric The patient has no complaints or symptoms. Objective Constitutional Patient is hypertensive.. Pulse regular and within target range for patient.[name] Respirations regular, non-labored and within target range.. Temperature is normal and within the target range for the patient.. Patient appears frail but is able to to participate in the examination.. Ahn, Bless VMarland Kitchen ([id number]) Vitals Time Taken: 10:12 AM, Height: 64 in, Source: Stated, Weight: 216 lbs, Source: Stated, BMI: 37.1, Temperature: 97.6 F, Pulse: 73 bpm, Respiratory Rate: 16 breaths/min, Blood Pressure: 172/55 mmHg. Respiratory Respiratory effort is easy and symmetric bilaterally. Rate is normal at rest and on room air.. Bilateral breath sounds are clear and equal in all lobes with no wheezes, rales or rhonchi.. Cardiovascular Heart rhythm and rate regular, without murmur or gallop.. Femoral arteries without bruits and pulses strong.. Pedal pulses absent bilaterally.. Lymphatic None palpable in the popliteal or inguinal area. Musculoskeletal The dorsal aspect of her right first metatarsal phalangeal joint is darkened however husband states this is chronic. There is no tenderness no effusion she also has a dark second toe.[name] Neurological Absent vibration sense. General Notes: Wound exam; #1 the patient has 2 open areas in the surgical scar that was through saphenous vein harvest site. Both of these wounds had a necrotic surface of the required debridement with a #3 curet these clean up quite nicely although there is weeping edema coming out of both wound areas. #2 the blister on the anterior left leg was also debrided with a #3 curet hold is also has weeping edema fluid. #3 the patient has 2 small areas on the dorsal aspect of the right great toe the deepest of these at the metatarsal phalangeal joint. I also cleaned up this area. With a #3 curet and #4 no  evidence of infection at any site Integumentary (Hair, Skin) Wound #1 status is Open. Original cause of wound was Surgical Injury. The wound is located on the Right Lower Leg. The wound measures 9.5cm length x 1cm width x 0.3cm depth; 7.461cm^2 area and 2.238cm^3 volume. The wound is limited to skin breakdown. There is no tunneling or undermining noted. There is a large amount of purulent drainage noted. The wound margin is flat and intact. There is no granulation within the wound bed. There is a large (67-100%) amount of necrotic tissue within the wound bed including Adherent Slough. The periwound skin appearance exhibited: Scarring, Moist, Ecchymosis. The periwound skin appearance did not exhibit: Callus, Crepitus, Excoriation, Fluctuance, Friable, Induration, Localized Edema, Rash, Dry/Scaly, Maceration, Atrophie Blanche, Cyanosis, Hemosiderin Staining, Mottled, Pallor, Rubor, Erythema. Wound #2 status is Open. Original cause of wound was Blister. The wound is located on the Right,Distal,Medial Lower Leg. The wound measures 4.1cm length x 6.2cm width x 0.1cm depth; 19.965cm^2 area and 1.996cm^3 volume. There is fat exposed. There is no tunneling or undermining noted. There is a medium amount of serous drainage noted. The wound margin is flat and intact. There is medium (34-66%) red granulation within  the wound bed. There is a medium (34-66%) amount of necrotic tissue within the wound bed including Adherent Slough. The periwound skin appearance exhibited: Excoriation, Moist. The periwound skin appearance did not exhibit: Callus, Crepitus, Fluctuance, Friable, Induration, Localized Edema, Rash, Scarring, Dry/Scaly, Maceration, Atrophie Blanche, Cyanosis, Ecchymosis, Hemosiderin Staining, Mottled, Pallor, Rubor, Erythema. Signer, Zenola V. ([id number]) Wound #3 status is Open. Original cause of wound was Surgical Injury. The wound is located on the Right,Dorsal [employer] The wound measures 0.5cm  length x 0.3cm width x 1cm depth; 0.118cm^2 area and 0.118cm^3 volume. The wound is limited to skin breakdown. There is no tunneling or undermining noted. There is a large amount of purulent drainage noted. The wound margin is indistinct and nonvisible. The periwound skin appearance did not exhibit: Callus, Crepitus, Excoriation, Fluctuance, Friable, Induration, Localized Edema, Rash, Scarring, Dry/Scaly, Maceration, Moist, Atrophie Blanche, Cyanosis, Ecchymosis, Hemosiderin Staining, Mottled, Pallor, Rubor, Erythema. General Notes: Unable to visualize wound bed Wound #4 status is Open. Original cause of wound was Trauma. The wound is located on the Right,Plantar Toe Second. The wound measures 2.1cm length x 3cm width x 0.1cm depth; 4.948cm^2 area and 0.495cm^3 volume. The wound is limited to skin breakdown. There is no tunneling or undermining noted. There is a none present amount of drainage noted. The wound margin is indistinct and nonvisible. 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 There was a Skin/Subcutaneous Tissue Debridement [id number]) debridement with total area of 9.5 sq cm performed by Ricard Dillon, MD. with the following instrument(s): scoop to remove Viable and Non-Viable tissue/material including Exudate, Fibrin/Slough, and Subcutaneous after achieving pain control using Lidocaine 4% Topical Solution. A time out was conducted at 10:52, prior to the start of the procedure. A Minimum  amount of bleeding was controlled with Pressure. The procedure was tolerated well with a pain level of 0 throughout and a pain level of 0 following the procedure. Post Debridement Measurements: 9.5cm length x 1cm width x 0.3cm depth; 2.238cm^3 volume. Character of Wound/Ulcer Post Debridement requires further debridement. Severity of Tissue Post Debridement is: Fat layer exposed. Post procedure Diagnosis Wound #1: Same as Pre-Procedure Ashford, Thurma V. ([id number]) Wound #2 Wound #2 is a Diabetic Wound/Ulcer of the Lower Extremity located on the Right,Distal,Medial Lower Leg . There was a Skin/Subcutaneous Tissue Debridement [id number] debridement with total area of 25.42 sq cm performed by Ricard Dillon, MD. with the following instrument(s): scoop to remove Viable and Non-Viable tissue/material including Exudate, Fibrin/Slough, and Subcutaneous after achieving pain control using Lidocaine 4% Topical Solution. A time out was conducted at 10:52, prior to the start of the procedure. A Minimum amount of bleeding was controlled with Pressure. The procedure was tolerated well with a pain level of 0 throughout and a pain level of 0 following the procedure. Post Debridement Measurements: 4.1cm length x 6.2cm width x 0.1cm depth; 1.996cm^3 volume. Character of Wound/Ulcer Post Debridement requires further debridement. Severity of Tissue Post Debridement is: Fat layer exposed. Post procedure Diagnosis Wound #2: Same as Pre-Procedure Wound #3 Wound #3 is a Diabetic  Wound/Ulcer of the Lower Extremity located on the Right,Dorsal Toe Great . There was a Skin/Subcutaneous Tissue Debridement [id number]) debridement with total area of 0.15 sq cm performed by Ricard Dillon, MD. with the following instrument(s): scoop to remove Viable and Non-Viable tissue/material including Exudate, Fibrin/Slough, and Subcutaneous after achieving pain control using Lidocaine 4% Topical Solution. A time out was  conducted at 10:52, prior to the start of the procedure. A Minimum amount of bleeding was controlled with Pressure. The procedure was tolerated well with a pain level of 0 throughout and a pain level of 0 following the procedure. Post Debridement Measurements: 0.5cm length x 0.3cm width x 1cm depth; 0.118cm^3 volume. Character of Wound/Ulcer Post Debridement requires further debridement. Severity of Tissue Post Debridement is: Fat layer exposed. Post procedure Diagnosis Wound #3: Same as Pre-Procedure Plan Wound Cleansing: Wound #1 Right Lower Leg: Clean wound with Normal Saline. Cleanse wound with mild soap and water Wound #2 Right,Distal,Medial Lower Leg: Clean wound with Normal Saline. Cleanse wound with mild soap and water Wound #3 Right,Dorsal Toe Great: Clean wound with Normal Saline. Cleanse wound with mild soap and water Wound #4 Right,Plantar Toe Second: Clean wound with Normal Saline. Cleanse wound with mild soap and water Anesthetic: Wound #1 Right Lower Leg: Topical Lidocaine 4% cream applied to wound bed prior to debridement - for clinic use Brandel, Mahealani V. ([id number]) Wound #2 Right,Distal,Medial Lower Leg: Topical Lidocaine 4% cream applied to wound bed prior to debridement - for clinic use Wound #3 Right,Dorsal Toe Great: Topical Lidocaine 4% cream applied to wound bed prior to debridement - for clinic use Wound #4 Right,Plantar Toe Second: Topical Lidocaine 4% cream applied to wound bed prior to debridement - for clinic use Primary Wound Dressing: Wound #1 Right Lower Leg: Aquacel Ag Wound #2 Right,Distal,Medial Lower Leg: Aquacel Ag Wound #3 Right,Dorsal Toe Great: Aquacel Ag Secondary Dressing: Wound #1 Right Lower Leg: ABD pad Wound #2 Right,Distal,Medial Lower Leg: ABD pad Wound #3 Right,Dorsal Toe Great: ABD pad Wound #4 Right,Plantar Toe Second: Foam - foam for the second plantar toe and to pad the right heel under wrap Other - band-aide Dressing  Change Frequency: Wound #1 Right Lower Leg: Three times weekly - HHRN to change on Mondays and Fridays and pt will come to wound care clinic on Wednesdays. Other: - Pt needs to be seen tomorrow Wednesday 08/14/16 as earlier as possible. Wound #2 Right,Distal,Medial Lower Leg: Three times weekly - HHRN to change on Mondays and Fridays and pt will come to wound care clinic on Wednesdays. Other: - Pt needs to be seen tomorrow Wednesday 08/14/16 as earlier as possible. Wound #3 Right,Dorsal Toe Great: Three times weekly - HHRN to change on Mondays and Fridays and pt will come to wound care clinic on Wednesdays. Other: - Pt needs to be seen tomorrow Wednesday 08/14/16 as earlier as possible. Wound #4 Right,Plantar Toe Second: Three times weekly - HHRN to change on Mondays and Fridays and pt will come to wound care clinic on Wednesdays. Other: - Pt needs to be seen tomorrow Wednesday 08/14/16 as earlier as possible. Follow-up Appointments: Wound #1 Right Lower Leg: Return Appointment in 1 week. Wound #2 Right,Distal,Medial Lower Leg: Return Appointment in 1 week. Wound #3 Right,Dorsal Toe Great: Return Appointment in 1 week. Edema Control: Wound #1 Right Lower Leg: Kerlix and Coban - Right Lower Extremity - Please remove the 3-layer wrap and apply the kerlix / coban wrap lightly to right leg. Hedgepeth, Zali V. ([id number]) Wound #  2 Right,Distal,Medial Lower Leg: Kerlix and Coban - Right Lower Extremity - Please remove the 3-layer wrap and apply the kerlix / coban wrap lightly to right leg. Wound #3 Right,Dorsal Toe Great: Kerlix and Coban - Right Lower Extremity - Please remove the 3-layer wrap and apply the kerlix / coban wrap lightly to right leg. Wound #4 Right,Plantar Toe Second: Kerlix and Coban - Right Lower Extremity - Please remove the 3-layer wrap and apply the kerlix / coban wrap lightly to right leg. Additional Orders / Instructions: Wound #1 Right Lower Leg: Increase protein  intake. Wound #2 Right,Distal,Medial Lower Leg: Increase protein intake. Wound #3 Right,Dorsal Toe Great: Increase protein intake. Wound #4 Right,Plantar Toe Second: Increase protein intake. Home Health: Wound #1 Right Lower Leg: [place] Nurse may visit PRN to address patient s wound care needs. FACE TO FACE ENCOUNTER: MEDICARE and MEDICAID PATIENTS: I certify that this patient is under my care and that I had a face-to-face encounter that meets the physician face-to-face encounter requirements with this patient on this date. The encounter with the patient was in whole or in part for the following MEDICAL CONDITION: (primary reason for [place]) MEDICAL NECESSITY: I certify, that based on my findings, NURSING services are a medically necessary home health service. HOME BOUND STATUS: I certify that my clinical findings support that this patient is homebound (i.e., Due to illness or injury, pt requires aid of supportive devices such as crutches, cane, wheelchairs, walkers, the use of special transportation or the assistance of another person to leave their place of residence. There is a normal inability to leave the home and doing so requires considerable and taxing effort. Other absences are for medical reasons / religious services and are infrequent or of short duration when for other reasons). If current dressing causes regression in wound condition, may D/C ordered dressing product/s and apply Normal Saline Moist Dressing daily until next [place] / Other MD appointment. [place] of regression in wound condition at [phone]. Please direct any NON-WOUND related issues/requests for orders to patient's Primary Care Physician Wound #2 Right,Distal,Medial Lower Leg: [place] Nurse may visit PRN to address patient s wound care needs. FACE TO FACE ENCOUNTER: MEDICARE  and MEDICAID PATIENTS: I certify that this patient is under my care and that I had a face-to-face encounter that meets the physician face-to-face encounter requirements with this patient on this date. The encounter with the patient was in whole or in part for the following MEDICAL CONDITION: (primary reason for [place]) MEDICAL NECESSITY: I certify, that based on my findings, NURSING services are a medically necessary home health service. HOME BOUND STATUS: I certify that my clinical findings support that this patient is homebound (i.e., Due to illness or injury, pt requires aid of supportive devices such as crutches, cane, wheelchairs, walkers, the use of special transportation or the assistance of another person to leave their place of residence. There is a normal inability to leave the home and doing so requires considerable and taxing effort. Other absences are for medical reasons / religious services and are infrequent or of short duration when for other reasons). If current dressing causes regression in wound condition, may D/C ordered dressing product/s and apply Normal Saline Moist Dressing daily until next [place] / Other MD appointment. Notify Wound Wehner, Brendaliz V. ([id number]) [place] of regression in wound condition at [phone]. Please direct any  NON-WOUND related issues/requests for orders to patient's Primary Care Physician Wound #3 Right,Dorsal Toe Great: [place] Nurse may visit PRN to address patient s wound care needs. FACE TO FACE ENCOUNTER: MEDICARE and MEDICAID PATIENTS: I certify that this patient is under my care and that I had a face-to-face encounter that meets the physician face-to-face encounter requirements with this patient on this date. The encounter with the patient was in whole or in part for the following MEDICAL CONDITION: (primary reason for [place]) MEDICAL NECESSITY: I  certify, that based on my findings, NURSING services are a medically necessary home health service. HOME BOUND STATUS: I certify that my clinical findings support that this patient is homebound (i.e., Due to illness or injury, pt requires aid of supportive devices such as crutches, cane, wheelchairs, walkers, the use of special transportation or the assistance of another person to leave their place of residence. There is a normal inability to leave the home and doing so requires considerable and taxing effort. Other absences are for medical reasons / religious services and are infrequent or of short duration when for other reasons). If current dressing causes regression in wound condition, may D/C ordered dressing product/s and apply Normal Saline Moist Dressing daily until next [place] / Other MD appointment. [place] of regression in wound condition at [phone]. Please direct any NON-WOUND related issues/requests for orders to patient's Primary Care Physician Wound #4 Right,Plantar Toe Second: [place] Nurse may visit PRN to address patient s wound care needs. FACE TO FACE ENCOUNTER: MEDICARE and MEDICAID PATIENTS: I certify that this patient is under my care and that I had a face-to-face encounter that meets the physician face-to-face encounter requirements with this patient on this date. The encounter with the patient was in whole or in part for the following MEDICAL CONDITION: (primary reason for [place]) MEDICAL NECESSITY: I certify, that based on my findings, NURSING services are a medically necessary home health service. HOME BOUND STATUS: I certify that my clinical findings support that this patient is homebound (i.e., Due to illness or injury, pt requires aid of supportive devices such as crutches, cane, wheelchairs, walkers, the use of special transportation or the assistance of another person to  leave their place of residence. There is a normal inability to leave the home and doing so requires considerable and taxing effort. Other absences are for medical reasons / religious services and are infrequent or of short duration when for other reasons). If current dressing causes regression in wound condition, may D/C ordered dressing product/s and apply Normal Saline Moist Dressing daily until next [place] / Other MD appointment. [place] of regression in wound condition at [phone]. Please direct any NON-WOUND related issues/requests for orders to patient's Primary Care Physician 1 the patient and her husband arrived giving a very rambling history of these 3 open areas. It was only after I had a chance to review [facility] that the pathogenesis of although this became somewhat clear. #2 we applied silver alginate to all wound areas which is appropriate however the Profore light wrap may of been excessive and will try to take that down to her Kerlix Coban. This needs to be weighed against the fact the patient is having weeping edema fluid coming out of the wounds will have to control the swelling some help. I note that she has arterial studies later this month  over I don't see that she had prior arterial Ripberger, Lace V. ([id number]) study with the last being on Dec 08, 2015. At that time her ABI on the right was 0.48 on the left 0.38. She had monophasic waveforms. However this was before her revascularization in July #3 we had ordered home health will get them out to change the compression otherwise we'll have to do this ourselves. Electronic Signature(s) Signed: 08/14/2016 4:47:07 PM By: Gretta Cool RN, BSN, Kim RN, BSN Signed: 08/14/2016 6:05:53 PM By: Linton Ham MD Previous Signature: 08/13/2016 4:24:12 PM Version By: Linton Ham MD Entered By: Gretta Cool RN, BSN, Kim on 08/14/2016 16:47:07 Briel, [name]  ([id number]) -------------------------------------------------------------------------------- ROS/PFSH Details Patient Name: Isham, Sheri V. Date of Service: 08/13/2016 9:30 AM Medical Record Patient Account Number: [id number] Number: Treating RN: Cornell Barman May 31, 1941 (75 y.o. Other Clinician: Date of Birth/Sex: Female) Treating Dania Marsan Primary Care Physician/Extender: Charna Busman Physician: Referring Physician: Dorthula Nettles in Treatment: 0 Information Obtained From Patient Caregiver Chart Wound History Do you currently have one or more open woundso Yes How many open wounds do you currently haveo 3 Approximately how long have you had your woundso 6 months How have you been treating your wound(s) until nowo Nurse changes Has your wound(s) ever healed and then re-openedo No Have you had any lab work done in the past montho No Have you tested positive for an antibiotic resistant organism (MRSA, VRE)o No Have you had any tests for circulation on your legso Yes Who ordered the testo oo Where was the test doneo [facility] Have you had other problems associated with your woundso Infection Constitutional Symptoms (General Health) Complaints and Symptoms: Negative for: Fatigue; Fever; Chills; Marked Weight Change Eyes Complaints and Symptoms: Positive for: Vision Changes Negative for: Dry Eyes; Glasses / Contacts Medical History: Positive for: Cataracts - removed Negative for: Glaucoma; Optic Neuritis Cardiovascular Complaints and Symptoms: Positive for: LE edema Negative for: Chest pain Medical History: Positive for: Congestive Heart Failure; Hypertension Negative for: Angina; Arrhythmia; Coronary Artery Disease; Deep Vein Thrombosis; Hypotension; Yeakle, Othell V. ([id number]) Myocardial Infarction; Peripheral Arterial Disease; Peripheral Venous Disease; Phlebitis Past Medical History Notes: Stroke 2017 Gastrointestinal Complaints and Symptoms: Negative  for: Frequent diarrhea; Nausea; Vomiting Medical History: Negative for: Cirrhosis ; Colitis; Crohnos; Hepatitis A; Hepatitis B; Hepatitis C Endocrine Complaints and Symptoms: Negative for: Hepatitis; Thyroid disease; Polydypsia (Excessive Thirst) Medical History: Positive for: Type II Diabetes Negative for: Type I Diabetes Time with diabetes: 12 years Treated with: Insulin Blood sugar tested every day: Yes Tested : 2 Blood sugar testing results: Breakfast: 325 Integumentary (Skin) Complaints and Symptoms: Positive for: Wounds Negative for: Bleeding or bruising tendency; Breakdown; Swelling Medical History: Negative for: History of Burn; History of pressure wounds Musculoskeletal Complaints and Symptoms: Negative for: Muscle Pain Medical History: Negative for: Gout; Rheumatoid Arthritis; Osteoarthritis; Osteomyelitis Psychiatric Complaints and Symptoms: No Complaints or Symptoms Complaints and Symptoms: Negative for: Anxiety; Claustrophobia Aguilera, Hannahgrace V. ([id number]) Medical History: Negative for: Anorexia/bulimia; Confinement Anxiety Ear/Nose/Mouth/Throat Complaints and Symptoms: No Complaints or Symptoms Medical History: Positive for: Chronic sinus problems/congestion Negative for: Middle ear problems Hematologic/Lymphatic Complaints and Symptoms: No Complaints or Symptoms Medical History: Negative for: Anemia; Hemophilia; Human Immunodeficiency Virus; Lymphedema; Sickle Cell Disease Respiratory Complaints and Symptoms: No Complaints or Symptoms Medical History: Negative for: Aspiration; Asthma; Chronic Obstructive Pulmonary Disease (COPD); Pneumothorax; Sleep Apnea; Tuberculosis Genitourinary Complaints and Symptoms: No Complaints or Symptoms Medical History: Negative for: End Stage Renal Disease Immunological Complaints and Symptoms: No Complaints  or Symptoms Medical History: Negative for: Lupus Erythematosus; Raynaudos;  Scleroderma Neurologic Complaints and Symptoms: No Complaints or Symptoms Medical History: Padin, Elsi V. ([id number]) Negative for: Dementia; Neuropathy; Quadriplegia; Paraplegia; Seizure Disorder Oncologic Complaints and Symptoms: No Complaints or Symptoms Medical History: Negative for: Received Chemotherapy; Received Radiation HBO Extended History Items Eyes: Ear/Nose/Mouth/Throat: Cataracts Chronic sinus problems/congestion Immunizations Pneumococcal Vaccine: Received Pneumococcal Vaccination: Yes Family and Social History Never smoker; Marital Status - Married; Alcohol Use: Never; Drug Use: No History; Caffeine Use: Daily; Advanced Directives: No; Patient does not want information on Advanced Directives; Living Will: No; Medical Power of Attorney: No Electronic Signature(s) Signed: 08/13/2016 4:24:12 PM By: Linton Ham MD Signed: 08/14/2016 2:01:17 PM By: Gretta Cool RN, BSN, Kim RN, BSN Entered By: Gretta Cool, RN, BSN, Kim on 08/13/2016 10:22:14 Raetz, [name] ([id number]) -------------------------------------------------------------------------------- SuperBill Details Patient Name: Mcclees, Amanii V. Date of Service: 08/13/2016 Medical Record Patient Account Number: [id number] Number: Treating RN: Ahmed Prima January 15, 1941 (75 y.o. Other Clinician: Date of Birth/Sex: Female) Treating Jaise Moser Primary Care Physician/Extender: Charna Busman Physician: Weeks in Treatment: 0 Referring Physician: Beverlyn Roux Diagnosis Coding ICD-10 Codes Code Description [phone] Non-pressure chronic ulcer of right calf limited to breakdown of skin Disruption of external operation (surgical) wound, not elsewhere classified, initial T81.31XA encounter L97.528 Non-pressure chronic ulcer of other part of left foot with other specified severity E11.621 Type 2 diabetes mellitus with foot ulcer E11.622 Type 2 diabetes mellitus with other skin ulcer Facility Procedures CPT4 Code  Description: [id number] - WOUND CARE VISIT-LEV 3 EST PT Modifier: Quantity: 1 CPT4 Code Description: [id number] - DEB SUBQ TISSUE 20 SQ CM/< ICD-10 Description Diagnosis L97.211 Non-pressure chronic ulcer of right calf limited to b Modifier: reakdown of skin Quantity: 1 CPT4 Code Description: [id number] 11045 - DEB SUBQ TISS EA ADDL 20CM ICD-10 Description Diagnosis L97.211 Non-pressure chronic ulcer of right calf limited to b Modifier: reakdown of skin Quantity: 1 Physician Procedures CPT4 Code Description: [id number] - WC PHYS LEVEL 4 - NEW PT ICD-10 Description Diagnosis L97.211 Non-pressure chronic ulcer of right calf limited to b E11.621 Type 2 diabetes mellitus with foot ulcer Modifier: 25 reakdown of skin Quantity: 1 CPT4 Code Description: [id number] - WC PHYS SUBQ TISS 20 SQ CM Ekholm, Kaylani V. ([id number]) Modifier: Quantity: 1 Electronic Signature(s) Signed: 08/13/2016 4:24:12 PM By: Linton Ham MD Signed: 08/13/2016 4:34:43 PM By: Alric Quan Entered By: Alric Quan on 08/13/2016 15:53:16

## 2016-08-15 NOTE — Telephone Encounter (Signed)
Pt son called to see if ranolazine (Ranaxa) could be changed to a more affordable Rx.  EDCM advised the process of changing with Pharm D.  And suggested that he contact PCP or Cardiologist to complete this request.

## 2016-08-21 ENCOUNTER — Ambulatory Visit: Payer: Medicare HMO | Admitting: Internal Medicine

## 2016-08-22 ENCOUNTER — Encounter: Payer: Self-pay | Admitting: Vascular Surgery

## 2016-08-26 NOTE — Progress Notes (Deleted)
   Established Surgical Bypass   History of Present Illness  [name] is a 76 y.o. ([birth year]) female who presents cc: ***.  Prior procedures: 1. Right below-the-knee popliteal artery to peroneal artery bypass with reversed greater saphenous vein  2.  Endarterectomy of mid-segment peroneal artery(Date: 02/28/16).   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 The patient has been in rehab since discharge from the hospital.  She recently was readmitted to the hospital for ACS.  Dr. Einar Gip felt she was only a candidate for medical management.  Pt had been referred previous to Dr. Sharol Given: ***   ***Physical Examination  There were no vitals filed for this visit.  There is no height or weight on file to calculate BMI.  General: {LOC:19197::"Somulent","Alert"}, {Orientation:19197::"Confused","O x 3"}, {Weight:19197::"Obese","Cachectic","WD"},{General state of health:19197::"Ill appearing","NAD"}  Pulmonary: {Chest wall:19197::"Asx chest movement","Sym exp"}, {Air movt:19197::"Decreased *** air movt","good B air movt"},{BS:19197::"rales on ***","rhonchi on ***","wheezing on ***","CTA B"}  Cardiac: {Rhythm:19197::"Irregularly, irregular rate and rhythm","RRR, Nl S1, S2"}, {Murmur:19197::"Murmur present: ***","no Murmurs"}, {Rubs:19197::"Rub present: ***","No rubs"}, {Gallop:19197::"Gallop present: ***","No S3,S4"}  Vascular: Vessel Right Left  Radial {Palpable:19197::"Not palpable","Palpable"} {Palpable:19197::"Not palpable","Palpable"}  Brachial {Palpable:19197::"Not palpable","Palpable"} {Palpable:19197::"Not palpable","Palpable"}  Carotid Palpable, {Bruit:19197::"Bruit present","No Bruit"} Palpable, {Bruit:19197::"Bruit present","No Bruit"}  Aorta Not palpable N/A  Femoral {Palpable:19197::"Not palpable","Palpable"} {Palpable:19197::"Not palpable","Palpable"}  Popliteal  {Palpable:19197::"Prominently palpable","Not palpable"} {Palpable:19197::"Prominently palpable","Not palpable"}  PT {Palpable:19197::"Not palpable","Palpable"} {Palpable:19197::"Not palpable","Palpable"}  DP {Palpable:19197::"Not palpable","Palpable"} {Palpable:19197::"Not palpable","Palpable"}   Gastrointestinal: soft, {Distension:19197::"distended","non-distended"}, {TTP:19197::"TTP in *** quadrant","appropriate tenderness to palpation","non-tender to palpation"}, {Guarding:19197::"Guarding and rebound in *** quadrant","No guarding or rebound"}, {HSM:19197::"HSM present","no HSM"}, {Masses:19197::"Mass present: ***","no masses"}, {Flank:19197::"CVAT on ***","Flank bruit present on ***","no CVAT B"}, {AAA:19197::"Palpable prominent aortic pulse","No palpable prominent aortic pulse"}, {Surgical incision:19197::"Surg incisions: ***","Surgical incisions well healed"}  Musculoskeletal: M/S 5/5 throughout{MS:19197::" except ***"," "}, Extremities without ischemic changes{MS:19197::" except ***"," "}, {Edema:19197::"Edema in *** legs","No edema present"}, {Varicosities:19197::"Varicosities present: ***","No varicosities present"}, {LDS:19197::"Lipodermatosclerosis present: *** leg","No lipodermatosclerosis present"}  Neurologic: CN 2-12 intact{CN:19197::" except ***"," "}, Pain and light touch intact in extremities{CN:19197::"except ***"," "}, Motor exam as listed above   Medical Decision Making  [name] is a 76 y.o. 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  Patient will be started on Lipitor 10 mg PO daily, to be titrated and managed by their primary physician.","on a statin:  ***."} The patient is currently {Antiplatelet:19197::"not on an  anti-platelet.  Patient will be started on ASA 81 mg PO daily","on an anti-platelet: ***."}    Adele Barthel, MD, FACS [facility] Office: [phone] Pager: [phone]

## 2016-08-28 ENCOUNTER — Other Ambulatory Visit (HOSPITAL_COMMUNITY): Payer: Medicare HMO

## 2016-08-28 ENCOUNTER — Encounter (HOSPITAL_COMMUNITY): Payer: Medicare HMO

## 2016-08-28 ENCOUNTER — Inpatient Hospital Stay (HOSPITAL_COMMUNITY)
Admission: EM | Admit: 2016-08-28 | Discharge: 2016-09-17 | DRG: 871 | Disposition: A | Discharge status: Skilled Nursing Facility | Payer: Medicare HMO | Attending: Internal Medicine | Admitting: Internal Medicine

## 2016-08-28 ENCOUNTER — Ambulatory Visit: Payer: Medicare HMO | Admitting: Vascular Surgery

## 2016-08-28 ENCOUNTER — Encounter: Payer: Medicare HMO | Admitting: Internal Medicine

## 2016-08-28 DIAGNOSIS — E1140 Type 2 diabetes mellitus with diabetic neuropathy, unspecified: Secondary | ICD-10-CM | POA: Diagnosis present

## 2016-08-28 DIAGNOSIS — Z794 Long term (current) use of insulin: Secondary | ICD-10-CM

## 2016-08-28 DIAGNOSIS — Z91013 Allergy to seafood: Secondary | ICD-10-CM

## 2016-08-28 DIAGNOSIS — L97218 Non-pressure chronic ulcer of right calf with other specified severity: Secondary | ICD-10-CM | POA: Diagnosis present

## 2016-08-28 DIAGNOSIS — J9601 Acute respiratory failure with hypoxia: Secondary | ICD-10-CM | POA: Diagnosis present

## 2016-08-28 DIAGNOSIS — E11622 Type 2 diabetes mellitus with other skin ulcer: Secondary | ICD-10-CM | POA: Diagnosis present

## 2016-08-28 DIAGNOSIS — E875 Hyperkalemia: Secondary | ICD-10-CM | POA: Diagnosis present

## 2016-08-28 DIAGNOSIS — I70269 Atherosclerosis of native arteries of extremities with gangrene, unspecified extremity: Secondary | ICD-10-CM | POA: Diagnosis present

## 2016-08-28 DIAGNOSIS — A419 Sepsis, unspecified organism: Secondary | ICD-10-CM

## 2016-08-28 DIAGNOSIS — I350 Nonrheumatic aortic (valve) stenosis: Secondary | ICD-10-CM | POA: Diagnosis present

## 2016-08-28 DIAGNOSIS — A401 Sepsis due to streptococcus, group B: Secondary | ICD-10-CM

## 2016-08-28 DIAGNOSIS — I10 Essential (primary) hypertension: Secondary | ICD-10-CM

## 2016-08-28 DIAGNOSIS — J811 Chronic pulmonary edema: Secondary | ICD-10-CM

## 2016-08-28 DIAGNOSIS — J154 Pneumonia due to other streptococci: Secondary | ICD-10-CM | POA: Diagnosis present

## 2016-08-28 DIAGNOSIS — R0600 Dyspnea, unspecified: Secondary | ICD-10-CM

## 2016-08-28 DIAGNOSIS — Z7982 Long term (current) use of aspirin: Secondary | ICD-10-CM

## 2016-08-28 DIAGNOSIS — E119 Type 2 diabetes mellitus without complications: Secondary | ICD-10-CM

## 2016-08-28 DIAGNOSIS — K5900 Constipation, unspecified: Secondary | ICD-10-CM

## 2016-08-28 DIAGNOSIS — R7881 Bacteremia: Secondary | ICD-10-CM | POA: Diagnosis present

## 2016-08-28 DIAGNOSIS — G9341 Metabolic encephalopathy: Secondary | ICD-10-CM | POA: Diagnosis present

## 2016-08-28 DIAGNOSIS — E7800 Pure hypercholesterolemia, unspecified: Secondary | ICD-10-CM | POA: Diagnosis present

## 2016-08-28 DIAGNOSIS — M6281 Muscle weakness (generalized): Secondary | ICD-10-CM

## 2016-08-28 DIAGNOSIS — E785 Hyperlipidemia, unspecified: Secondary | ICD-10-CM | POA: Diagnosis present

## 2016-08-28 DIAGNOSIS — Z882 Allergy status to sulfonamides status: Secondary | ICD-10-CM

## 2016-08-28 DIAGNOSIS — I70219 Atherosclerosis of native arteries of extremities with intermittent claudication, unspecified extremity: Secondary | ICD-10-CM

## 2016-08-28 DIAGNOSIS — L97919 Non-pressure chronic ulcer of unspecified part of right lower leg with unspecified severity: Secondary | ICD-10-CM | POA: Diagnosis present

## 2016-08-28 DIAGNOSIS — S81809A Unspecified open wound, unspecified lower leg, initial encounter: Secondary | ICD-10-CM

## 2016-08-28 DIAGNOSIS — Z79899 Other long term (current) drug therapy: Secondary | ICD-10-CM

## 2016-08-28 DIAGNOSIS — E1165 Type 2 diabetes mellitus with hyperglycemia: Secondary | ICD-10-CM | POA: Diagnosis present

## 2016-08-28 DIAGNOSIS — I69351 Hemiplegia and hemiparesis following cerebral infarction affecting right dominant side: Secondary | ICD-10-CM

## 2016-08-28 DIAGNOSIS — G9340 Encephalopathy, unspecified: Secondary | ICD-10-CM | POA: Diagnosis present

## 2016-08-28 DIAGNOSIS — E11621 Type 2 diabetes mellitus with foot ulcer: Secondary | ICD-10-CM | POA: Diagnosis present

## 2016-08-28 DIAGNOSIS — D649 Anemia, unspecified: Secondary | ICD-10-CM

## 2016-08-28 DIAGNOSIS — I5033 Acute on chronic diastolic (congestive) heart failure: Secondary | ICD-10-CM | POA: Diagnosis present

## 2016-08-28 DIAGNOSIS — L89610 Pressure ulcer of right heel, unstageable: Secondary | ICD-10-CM | POA: Diagnosis present

## 2016-08-28 DIAGNOSIS — E876 Hypokalemia: Secondary | ICD-10-CM | POA: Diagnosis present

## 2016-08-28 DIAGNOSIS — Y95 Nosocomial condition: Secondary | ICD-10-CM | POA: Diagnosis present

## 2016-08-28 DIAGNOSIS — I7025 Atherosclerosis of native arteries of other extremities with ulceration: Secondary | ICD-10-CM | POA: Diagnosis present

## 2016-08-28 DIAGNOSIS — K219 Gastro-esophageal reflux disease without esophagitis: Secondary | ICD-10-CM | POA: Diagnosis present

## 2016-08-28 DIAGNOSIS — I110 Hypertensive heart disease with heart failure: Secondary | ICD-10-CM | POA: Diagnosis present

## 2016-08-28 DIAGNOSIS — Z833 Family history of diabetes mellitus: Secondary | ICD-10-CM

## 2016-08-28 DIAGNOSIS — Z7902 Long term (current) use of antithrombotics/antiplatelets: Secondary | ICD-10-CM

## 2016-08-28 DIAGNOSIS — I739 Peripheral vascular disease, unspecified: Secondary | ICD-10-CM

## 2016-08-28 DIAGNOSIS — J189 Pneumonia, unspecified organism: Secondary | ICD-10-CM | POA: Diagnosis present

## 2016-08-28 DIAGNOSIS — Z880 Allergy status to penicillin: Secondary | ICD-10-CM

## 2016-08-28 DIAGNOSIS — E1151 Type 2 diabetes mellitus with diabetic peripheral angiopathy without gangrene: Secondary | ICD-10-CM | POA: Diagnosis present

## 2016-08-28 DIAGNOSIS — Z91012 Allergy to eggs: Secondary | ICD-10-CM

## 2016-08-28 DIAGNOSIS — R4182 Altered mental status, unspecified: Secondary | ICD-10-CM

## 2016-08-29 ENCOUNTER — Emergency Department (HOSPITAL_COMMUNITY): Payer: Medicare HMO

## 2016-08-29 ENCOUNTER — Inpatient Hospital Stay (HOSPITAL_COMMUNITY): Payer: Medicare HMO

## 2016-08-29 ENCOUNTER — Encounter (HOSPITAL_COMMUNITY): Payer: Self-pay | Admitting: Emergency Medicine

## 2016-08-29 DIAGNOSIS — E11621 Type 2 diabetes mellitus with foot ulcer: Secondary | ICD-10-CM | POA: Diagnosis present

## 2016-08-29 DIAGNOSIS — L97218 Non-pressure chronic ulcer of right calf with other specified severity: Secondary | ICD-10-CM | POA: Diagnosis present

## 2016-08-29 DIAGNOSIS — J189 Pneumonia, unspecified organism: Secondary | ICD-10-CM | POA: Diagnosis not present

## 2016-08-29 DIAGNOSIS — E7800 Pure hypercholesterolemia, unspecified: Secondary | ICD-10-CM | POA: Diagnosis present

## 2016-08-29 DIAGNOSIS — R7881 Bacteremia: Secondary | ICD-10-CM | POA: Diagnosis not present

## 2016-08-29 DIAGNOSIS — E785 Hyperlipidemia, unspecified: Secondary | ICD-10-CM | POA: Diagnosis present

## 2016-08-29 DIAGNOSIS — A419 Sepsis, unspecified organism: Secondary | ICD-10-CM | POA: Diagnosis present

## 2016-08-29 DIAGNOSIS — I10 Essential (primary) hypertension: Secondary | ICD-10-CM | POA: Diagnosis not present

## 2016-08-29 DIAGNOSIS — Z794 Long term (current) use of insulin: Secondary | ICD-10-CM

## 2016-08-29 DIAGNOSIS — I69351 Hemiplegia and hemiparesis following cerebral infarction affecting right dominant side: Secondary | ICD-10-CM | POA: Diagnosis not present

## 2016-08-29 DIAGNOSIS — Z7982 Long term (current) use of aspirin: Secondary | ICD-10-CM | POA: Diagnosis not present

## 2016-08-29 DIAGNOSIS — E876 Hypokalemia: Secondary | ICD-10-CM | POA: Diagnosis present

## 2016-08-29 DIAGNOSIS — K219 Gastro-esophageal reflux disease without esophagitis: Secondary | ICD-10-CM | POA: Diagnosis present

## 2016-08-29 DIAGNOSIS — I509 Heart failure, unspecified: Secondary | ICD-10-CM | POA: Diagnosis not present

## 2016-08-29 DIAGNOSIS — E11622 Type 2 diabetes mellitus with other skin ulcer: Secondary | ICD-10-CM | POA: Diagnosis present

## 2016-08-29 DIAGNOSIS — Z95828 Presence of other vascular implants and grafts: Secondary | ICD-10-CM | POA: Diagnosis not present

## 2016-08-29 DIAGNOSIS — E1159 Type 2 diabetes mellitus with other circulatory complications: Secondary | ICD-10-CM | POA: Diagnosis not present

## 2016-08-29 DIAGNOSIS — I5033 Acute on chronic diastolic (congestive) heart failure: Secondary | ICD-10-CM | POA: Diagnosis present

## 2016-08-29 DIAGNOSIS — J9601 Acute respiratory failure with hypoxia: Secondary | ICD-10-CM | POA: Diagnosis present

## 2016-08-29 DIAGNOSIS — D649 Anemia, unspecified: Secondary | ICD-10-CM | POA: Diagnosis present

## 2016-08-29 DIAGNOSIS — J154 Pneumonia due to other streptococci: Secondary | ICD-10-CM | POA: Diagnosis present

## 2016-08-29 DIAGNOSIS — R4182 Altered mental status, unspecified: Secondary | ICD-10-CM | POA: Diagnosis not present

## 2016-08-29 DIAGNOSIS — E1151 Type 2 diabetes mellitus with diabetic peripheral angiopathy without gangrene: Secondary | ICD-10-CM | POA: Diagnosis present

## 2016-08-29 DIAGNOSIS — Y95 Nosocomial condition: Secondary | ICD-10-CM | POA: Diagnosis present

## 2016-08-29 DIAGNOSIS — E1140 Type 2 diabetes mellitus with diabetic neuropathy, unspecified: Secondary | ICD-10-CM | POA: Diagnosis present

## 2016-08-29 DIAGNOSIS — I7025 Atherosclerosis of native arteries of other extremities with ulceration: Secondary | ICD-10-CM | POA: Diagnosis present

## 2016-08-29 DIAGNOSIS — A401 Sepsis due to streptococcus, group B: Secondary | ICD-10-CM | POA: Diagnosis present

## 2016-08-29 DIAGNOSIS — R011 Cardiac murmur, unspecified: Secondary | ICD-10-CM | POA: Diagnosis not present

## 2016-08-29 DIAGNOSIS — K5900 Constipation, unspecified: Secondary | ICD-10-CM | POA: Diagnosis present

## 2016-08-29 DIAGNOSIS — L97919 Non-pressure chronic ulcer of unspecified part of right lower leg with unspecified severity: Secondary | ICD-10-CM | POA: Diagnosis present

## 2016-08-29 DIAGNOSIS — G9341 Metabolic encephalopathy: Secondary | ICD-10-CM | POA: Diagnosis present

## 2016-08-29 DIAGNOSIS — Z8673 Personal history of transient ischemic attack (TIA), and cerebral infarction without residual deficits: Secondary | ICD-10-CM | POA: Diagnosis not present

## 2016-08-29 DIAGNOSIS — Z881 Allergy status to other antibiotic agents status: Secondary | ICD-10-CM | POA: Diagnosis not present

## 2016-08-29 DIAGNOSIS — Z880 Allergy status to penicillin: Secondary | ICD-10-CM | POA: Diagnosis not present

## 2016-08-29 DIAGNOSIS — E1165 Type 2 diabetes mellitus with hyperglycemia: Secondary | ICD-10-CM | POA: Diagnosis present

## 2016-08-29 DIAGNOSIS — L89610 Pressure ulcer of right heel, unstageable: Secondary | ICD-10-CM | POA: Diagnosis present

## 2016-08-29 DIAGNOSIS — I350 Nonrheumatic aortic (valve) stenosis: Secondary | ICD-10-CM | POA: Diagnosis present

## 2016-08-29 DIAGNOSIS — L97819 Non-pressure chronic ulcer of other part of right lower leg with unspecified severity: Secondary | ICD-10-CM | POA: Diagnosis not present

## 2016-08-29 DIAGNOSIS — G9340 Encephalopathy, unspecified: Secondary | ICD-10-CM | POA: Diagnosis not present

## 2016-08-29 DIAGNOSIS — I110 Hypertensive heart disease with heart failure: Secondary | ICD-10-CM | POA: Diagnosis present

## 2016-08-29 DIAGNOSIS — E875 Hyperkalemia: Secondary | ICD-10-CM | POA: Diagnosis present

## 2016-08-29 DIAGNOSIS — B351 Tinea unguium: Secondary | ICD-10-CM | POA: Diagnosis not present

## 2016-08-29 LAB — I-STAT ARTERIAL BLOOD GAS, ED
BICARBONATE: 23.6 mmol/L (ref 20.0–28.0)
O2 Saturation: 91 %
PH ART: 7.435 (ref 7.350–7.450)
PO2 ART: 60 mmHg — AB (ref 83.0–108.0)
Patient temperature: 98.6
TCO2: 25 mmol/L (ref 0–100)
pCO2 arterial: 35.2 mmHg (ref 32.0–48.0)

## 2016-08-29 LAB — COMPREHENSIVE METABOLIC PANEL
ALT: 27 U/L (ref 14–54)
ANION GAP: 14 (ref 5–15)
AST: 26 U/L (ref 15–41)
Albumin: 3.4 g/dL — ABNORMAL LOW (ref 3.5–5.0)
Alkaline Phosphatase: 69 U/L (ref 38–126)
BUN: 21 mg/dL — ABNORMAL HIGH (ref 6–20)
CHLORIDE: 99 mmol/L — AB (ref 101–111)
CO2: 22 mmol/L (ref 22–32)
Calcium: 9.1 mg/dL (ref 8.9–10.3)
Creatinine, Ser: 0.95 mg/dL (ref 0.44–1.00)
GFR, EST NON AFRICAN AMERICAN: 58 mL/min — AB (ref >60)
Glucose, Bld: 263 mg/dL — ABNORMAL HIGH (ref 65–99)
Potassium: 3.5 mmol/L (ref 3.5–5.1)
SODIUM: 135 mmol/L (ref 135–145)
Total Bilirubin: 0.8 mg/dL (ref 0.3–1.2)
Total Protein: 6.7 g/dL (ref 6.5–8.1)

## 2016-08-29 LAB — URINALYSIS, ROUTINE W REFLEX MICROSCOPIC
BILIRUBIN URINE: NEGATIVE
GLUCOSE, UA: NEGATIVE mg/dL
KETONES UR: NEGATIVE mg/dL
NITRITE: NEGATIVE
PROTEIN: NEGATIVE mg/dL
Specific Gravity, Urine: 1.015 (ref 1.005–1.030)
pH: 5 (ref 5.0–8.0)

## 2016-08-29 LAB — BLOOD CULTURE ID PANEL (REFLEXED)
Acinetobacter baumannii: NOT DETECTED
CANDIDA GLABRATA: NOT DETECTED
CANDIDA KRUSEI: NOT DETECTED
CANDIDA TROPICALIS: NOT DETECTED
Candida albicans: NOT DETECTED
Candida parapsilosis: NOT DETECTED
ENTEROBACTER CLOACAE COMPLEX: NOT DETECTED
Enterobacteriaceae species: NOT DETECTED
Enterococcus species: NOT DETECTED
Escherichia coli: NOT DETECTED
Haemophilus influenzae: NOT DETECTED
KLEBSIELLA PNEUMONIAE: NOT DETECTED
Klebsiella oxytoca: NOT DETECTED
Listeria monocytogenes: NOT DETECTED
Neisseria meningitidis: NOT DETECTED
PROTEUS SPECIES: NOT DETECTED
Pseudomonas aeruginosa: NOT DETECTED
STAPHYLOCOCCUS SPECIES: NOT DETECTED
STREPTOCOCCUS AGALACTIAE: DETECTED — AB
Serratia marcescens: NOT DETECTED
Staphylococcus aureus (BCID): NOT DETECTED
Streptococcus pneumoniae: NOT DETECTED
Streptococcus pyogenes: NOT DETECTED
Streptococcus species: DETECTED — AB

## 2016-08-29 LAB — CBC WITH DIFFERENTIAL/PLATELET
BASOS PCT: 0 %
Basophils Absolute: 0 10*3/uL (ref 0.0–0.1)
EOS ABS: 0 10*3/uL (ref 0.0–0.7)
Eosinophils Relative: 0 %
HCT: 35.3 % — ABNORMAL LOW (ref 36.0–46.0)
HEMOGLOBIN: 11.3 g/dL — AB (ref 12.0–15.0)
Lymphocytes Relative: 3 %
Lymphs Abs: 0.5 10*3/uL — ABNORMAL LOW (ref 0.7–4.0)
MCH: 26 pg (ref 26.0–34.0)
MCHC: 32 g/dL (ref 30.0–36.0)
MCV: 81.1 fL (ref 78.0–100.0)
MONOS PCT: 4 %
Monocytes Absolute: 0.6 10*3/uL (ref 0.1–1.0)
NEUTROS ABS: 12.7 10*3/uL — AB (ref 1.7–7.7)
NEUTROS PCT: 92 %
Platelets: 163 10*3/uL (ref 150–400)
RBC: 4.35 MIL/uL (ref 3.87–5.11)
RDW: 16 % — ABNORMAL HIGH (ref 11.5–15.5)
WBC: 13.8 10*3/uL — AB (ref 4.0–10.5)

## 2016-08-29 LAB — HIV ANTIBODY (ROUTINE TESTING W REFLEX): HIV Screen 4th Generation wRfx: NONREACTIVE

## 2016-08-29 LAB — MRSA PCR SCREENING: MRSA BY PCR: NEGATIVE

## 2016-08-29 LAB — GLUCOSE, CAPILLARY
GLUCOSE-CAPILLARY: 150 mg/dL — AB (ref 65–99)
GLUCOSE-CAPILLARY: 146 mg/dL — AB (ref 65–99)

## 2016-08-29 LAB — I-STAT CG4 LACTIC ACID, ED
LACTIC ACID, VENOUS: 2.77 mmol/L — AB (ref 0.5–1.9)
LACTIC ACID, VENOUS: 5.03 mmol/L — AB (ref 0.5–1.9)

## 2016-08-29 LAB — LACTIC ACID, PLASMA: LACTIC ACID, VENOUS: 2.4 mmol/L — AB (ref 0.5–1.9)

## 2016-08-29 LAB — INFLUENZA PANEL BY PCR (TYPE A & B)
INFLAPCR: NEGATIVE
INFLBPCR: NEGATIVE

## 2016-08-29 LAB — AMMONIA: Ammonia: 34 umol/L (ref 9–35)

## 2016-08-29 LAB — CBG MONITORING, ED: GLUCOSE-CAPILLARY: 155 mg/dL — AB (ref 65–99)

## 2016-08-29 MED ORDER — DEXTROSE 5 % IV SOLN: 2.0000 g | Freq: Once | INTRAVENOUS | Status: DC

## 2016-08-29 MED ORDER — VANCOMYCIN HCL 10 G IV SOLR
1500.0000 mg | Freq: Once | INTRAVENOUS | Status: DC
  Filled 2016-08-29: qty 1500

## 2016-08-29 MED ORDER — ALBUTEROL SULFATE (2.5 MG/3ML) 0.083% IN NEBU
2.5000 mg | INHALATION_SOLUTION | Freq: Four times a day (QID) | RESPIRATORY_TRACT | Status: DC | PRN
  Administered 2016-08-30 – 2016-09-04 (×5): 2.5 mg via RESPIRATORY_TRACT
  Filled 2016-08-29 (×7): qty 3

## 2016-08-29 MED ORDER — ACETAMINOPHEN 325 MG PO TABS
650.0000 mg | ORAL_TABLET | Freq: Four times a day (QID) | ORAL | Status: DC | PRN
  Administered 2016-08-29 – 2016-09-09 (×11): 650 mg via ORAL
  Filled 2016-08-29 (×12): qty 2

## 2016-08-29 MED ORDER — SODIUM CHLORIDE 0.9 % IV SOLN
INTRAVENOUS | Status: DC
  Administered 2016-08-29: 22:00:00 via INTRAVENOUS
  Administered 2016-08-29: 125 mL/h via INTRAVENOUS
  Administered 2016-08-30 (×2): via INTRAVENOUS

## 2016-08-29 MED ORDER — DEXTROSE 5 % IV SOLN
2.0000 g | Freq: Three times a day (TID) | INTRAVENOUS | Status: DC
  Administered 2016-08-29 (×2): 2 g via INTRAVENOUS
  Filled 2016-08-29 (×2): qty 2

## 2016-08-29 MED ORDER — INSULIN ASPART 100 UNIT/ML ~~LOC~~ SOLN
0.0000 [IU] | SUBCUTANEOUS | Status: DC
  Administered 2016-08-29: 1 [IU] via SUBCUTANEOUS
  Administered 2016-08-30: 3 [IU] via SUBCUTANEOUS
  Administered 2016-08-30: 2 [IU] via SUBCUTANEOUS
  Administered 2016-08-30: 3 [IU] via SUBCUTANEOUS
  Administered 2016-08-30: 1 [IU] via SUBCUTANEOUS
  Administered 2016-08-31 (×3): 3 [IU] via SUBCUTANEOUS
  Administered 2016-08-31: 2 [IU] via SUBCUTANEOUS
  Administered 2016-08-31 – 2016-09-01 (×2): 3 [IU] via SUBCUTANEOUS
  Administered 2016-09-01: 2 [IU] via SUBCUTANEOUS
  Administered 2016-09-01: 5 [IU] via SUBCUTANEOUS
  Administered 2016-09-01: 3 [IU] via SUBCUTANEOUS
  Administered 2016-09-01 – 2016-09-02 (×4): 2 [IU] via SUBCUTANEOUS
  Administered 2016-09-02: 3 [IU] via SUBCUTANEOUS
  Administered 2016-09-02: 2 [IU] via SUBCUTANEOUS
  Administered 2016-09-02: 3 [IU] via SUBCUTANEOUS
  Administered 2016-09-02: 7 [IU] via SUBCUTANEOUS
  Administered 2016-09-03 (×2): 2 [IU] via SUBCUTANEOUS
  Administered 2016-09-03: 3 [IU] via SUBCUTANEOUS
  Administered 2016-09-03: 1 [IU] via SUBCUTANEOUS
  Administered 2016-09-03: 3 [IU] via SUBCUTANEOUS
  Administered 2016-09-03 – 2016-09-04 (×3): 5 [IU] via SUBCUTANEOUS
  Administered 2016-09-04: 2 [IU] via SUBCUTANEOUS
  Administered 2016-09-04 (×2): 5 [IU] via SUBCUTANEOUS
  Administered 2016-09-04: 2 [IU] via SUBCUTANEOUS
  Administered 2016-09-05: 5 [IU] via SUBCUTANEOUS
  Administered 2016-09-05: 3 [IU] via SUBCUTANEOUS
  Administered 2016-09-05: 5 [IU] via SUBCUTANEOUS
  Administered 2016-09-05 – 2016-09-06 (×4): 3 [IU] via SUBCUTANEOUS
  Administered 2016-09-06 (×3): 5 [IU] via SUBCUTANEOUS
  Administered 2016-09-07: 3 [IU] via SUBCUTANEOUS
  Administered 2016-09-07: 7 [IU] via SUBCUTANEOUS
  Administered 2016-09-07: 5 [IU] via SUBCUTANEOUS
  Administered 2016-09-07: 2 [IU] via SUBCUTANEOUS
  Administered 2016-09-07 – 2016-09-08 (×3): 3 [IU] via SUBCUTANEOUS
  Administered 2016-09-08 (×2): 5 [IU] via SUBCUTANEOUS
  Administered 2016-09-08: 2 [IU] via SUBCUTANEOUS
  Administered 2016-09-08 – 2016-09-09 (×2): 3 [IU] via SUBCUTANEOUS
  Administered 2016-09-09 (×3): 2 [IU] via SUBCUTANEOUS
  Administered 2016-09-09: 3 [IU] via SUBCUTANEOUS
  Administered 2016-09-09 – 2016-09-11 (×9): 2 [IU] via SUBCUTANEOUS
  Administered 2016-09-11: 3 [IU] via SUBCUTANEOUS
  Administered 2016-09-11 (×3): 2 [IU] via SUBCUTANEOUS
  Administered 2016-09-11: 1 [IU] via SUBCUTANEOUS
  Administered 2016-09-12: 2 [IU] via SUBCUTANEOUS
  Administered 2016-09-12: 3 [IU] via SUBCUTANEOUS
  Administered 2016-09-12: 2 [IU] via SUBCUTANEOUS
  Administered 2016-09-12: 5 [IU] via SUBCUTANEOUS
  Administered 2016-09-12 – 2016-09-13 (×2): 3 [IU] via SUBCUTANEOUS
  Administered 2016-09-13 (×2): 1 [IU] via SUBCUTANEOUS
  Administered 2016-09-13: 3 [IU] via SUBCUTANEOUS
  Administered 2016-09-13 – 2016-09-14 (×2): 2 [IU] via SUBCUTANEOUS
  Administered 2016-09-14: 1 [IU] via SUBCUTANEOUS
  Administered 2016-09-14 (×2): 2 [IU] via SUBCUTANEOUS
  Administered 2016-09-14 – 2016-09-15 (×3): 3 [IU] via SUBCUTANEOUS
  Administered 2016-09-15: 1 [IU] via SUBCUTANEOUS
  Administered 2016-09-15: 5 [IU] via SUBCUTANEOUS
  Administered 2016-09-15: 1 [IU] via SUBCUTANEOUS
  Administered 2016-09-15 – 2016-09-16 (×3): 2 [IU] via SUBCUTANEOUS
  Administered 2016-09-16: 3 [IU] via SUBCUTANEOUS
  Administered 2016-09-16: 2 [IU] via SUBCUTANEOUS
  Administered 2016-09-16: 3 [IU] via SUBCUTANEOUS
  Administered 2016-09-16 – 2016-09-17 (×3): 2 [IU] via SUBCUTANEOUS
  Administered 2016-09-17: 3 [IU] via SUBCUTANEOUS
  Administered 2016-09-17: 1 [IU] via SUBCUTANEOUS

## 2016-08-29 MED ORDER — SODIUM CHLORIDE 0.9 % IV BOLUS (SEPSIS)
1000.0000 mL | Freq: Once | INTRAVENOUS | Status: AC
  Administered 2016-08-29: 1000 mL via INTRAVENOUS

## 2016-08-29 MED ORDER — LEVOFLOXACIN IN D5W 750 MG/150ML IV SOLN
750.0000 mg | INTRAVENOUS | Status: DC
  Administered 2016-08-30 – 2016-09-04 (×6): 750 mg via INTRAVENOUS
  Filled 2016-08-29 (×6): qty 150

## 2016-08-29 MED ORDER — VANCOMYCIN HCL 10 G IV SOLR
1500.0000 mg | Freq: Once | INTRAVENOUS | Status: AC
  Administered 2016-08-29: 1500 mg via INTRAVENOUS
  Filled 2016-08-29: qty 1500

## 2016-08-29 MED ORDER — MUPIROCIN CALCIUM 2 % EX CREA
TOPICAL_CREAM | Freq: Every day | CUTANEOUS | Status: DC
  Administered 2016-09-01: 14:00:00 via TOPICAL
  Administered 2016-09-02 – 2016-09-03 (×2): 1 via TOPICAL
  Administered 2016-09-04 – 2016-09-07 (×3): via TOPICAL
  Administered 2016-09-08: 1 via TOPICAL
  Administered 2016-09-09 – 2016-09-14 (×6): via TOPICAL
  Administered 2016-09-15: 1 via TOPICAL
  Filled 2016-08-29 (×2): qty 15

## 2016-08-29 MED ORDER — ACETAMINOPHEN 650 MG RE SUPP
650.0000 mg | RECTAL | Status: DC | PRN
  Administered 2016-08-29 (×2): 650 mg via RECTAL
  Filled 2016-08-29 (×4): qty 1

## 2016-08-29 MED ORDER — HYDRALAZINE HCL 20 MG/ML IJ SOLN
10.0000 mg | INTRAMUSCULAR | Status: DC | PRN
  Administered 2016-08-30: 20 mg via INTRAVENOUS
  Filled 2016-08-29 (×2): qty 1

## 2016-08-29 MED ORDER — POLYETHYLENE GLYCOL 3350 17 G PO PACK
17.0000 g | PACK | Freq: Every day | ORAL | Status: DC | PRN
  Administered 2016-09-06 – 2016-09-13 (×2): 17 g via ORAL
  Filled 2016-08-29 (×3): qty 1

## 2016-08-29 MED ORDER — VANCOMYCIN HCL IN DEXTROSE 1-5 GM/200ML-% IV SOLN: 1000.0000 mg | Freq: Once | INTRAVENOUS | Status: DC

## 2016-08-29 MED ORDER — LEVOFLOXACIN IN D5W 750 MG/150ML IV SOLN: 750.0000 mg | Freq: Once | INTRAVENOUS | Status: DC

## 2016-08-29 MED ORDER — AZTREONAM 2 G IJ SOLR
1.0000 g | Freq: Three times a day (TID) | INTRAMUSCULAR | Status: DC
  Filled 2016-08-29: qty 2

## 2016-08-29 MED ORDER — ENOXAPARIN SODIUM 40 MG/0.4ML ~~LOC~~ SOLN
40.0000 mg | Freq: Every day | SUBCUTANEOUS | Status: DC
  Administered 2016-08-29 – 2016-09-17 (×20): 40 mg via SUBCUTANEOUS
  Filled 2016-08-29 (×19): qty 0.4

## 2016-08-29 MED ORDER — VANCOMYCIN HCL IN DEXTROSE 750-5 MG/150ML-% IV SOLN
750.0000 mg | Freq: Two times a day (BID) | INTRAVENOUS | Status: DC
  Administered 2016-08-29 – 2016-08-31 (×5): 750 mg via INTRAVENOUS
  Filled 2016-08-29 (×8): qty 150

## 2016-08-29 MED ORDER — LEVOFLOXACIN 750 MG PO TABS: 750.0000 mg | ORAL_TABLET | Freq: Once | ORAL | Status: DC

## 2016-08-29 MED ORDER — INSULIN GLARGINE 100 UNIT/ML ~~LOC~~ SOLN
15.0000 [IU] | Freq: Every day | SUBCUTANEOUS | Status: DC
  Administered 2016-08-29 – 2016-09-04 (×7): 15 [IU] via SUBCUTANEOUS
  Filled 2016-08-29 (×8): qty 0.15

## 2016-08-29 MED ORDER — LEVOFLOXACIN IN D5W 750 MG/150ML IV SOLN
750.0000 mg | Freq: Once | INTRAVENOUS | Status: AC
  Administered 2016-08-29: 750 mg via INTRAVENOUS
  Filled 2016-08-29: qty 150

## 2016-08-29 NOTE — ED Provider Notes (Signed)
[facility] DEPT Provider Note   CSN: [id number] Arrival date & time: 08/28/16  2356   By signing my name below, I, Delton Prairie, attest that this documentation has been prepared under the direction and in the presence of Delora Fuel, MD  Electronically Signed: Delton Prairie, ED Scribe. 08/29/16. 1:17 AM.   History   Chief Complaint Chief Complaint  Patient presents with  . Altered Mental Status   The history is provided by the spouse and a relative. No language interpreter was used.   HPI Comments: LEVEL 5 CAVEAT DUE TO ALTERED MENTAL STATUS   MARUEEN PEZZA is a 76 y.o. female, with a hx of stroke, HTN, DM and diabetic neuropathy, who presents to the Emergency Department, via EMS, in AMS x 8:30 PM yesterday. Son notes the pt was shaking on her right side prior to arrival and husband states the pt has not been acting right. Son notes a chronic wound to her right lower extremity.   Past Medical History:  Diagnosis Date  . Acute heart failure ([place])   . Aortic stenosis   . Complication of anesthesia    Hard to wake patient up after having anesthesia  . Diabetes mellitus without complication ([place])   . Diabetic neuropathy (HCC)    Takes Lyrica  . Edema of both legs    Takes Lasix  . GERD (gastroesophageal reflux disease)   . High cholesterol   . HTN (hypertension)   . Hypertension   . PAD (peripheral artery disease) ([place])   . Shortness of breath dyspnea   . Spasm of back muscles   . Stroke ([place])   . Wound, open    Right great toe    Patient Active Problem List   Diagnosis Date Noted  . Essential hypertension   . Acute on chronic diastolic heart failure ([place])   . Ulcer of right lower extremity ([place])   . Chest pain 07/05/2016  . Pleural effusion, bilateral   . Pulmonary congestion   . Wheeze   . Dyspnea   . CHF (congestive heart failure) ([place])   . Right sided weakness   . Acute respiratory acidosis   . Surgery, elective   . Anemia, iron deficiency   .  Leukocytosis   . Cerebrovascular accident (CVA) due to thrombosis of precerebral artery ([place]) 03/01/2016  . 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 Acute respiratory failure ([place])   . PAD (peripheral artery disease) ([place]) 02/28/2016  . Atherosclerosis of native arteries of the extremities with ulceration ([place]) 12/08/2015  . Open toe wound 05/04/2014  . Anemia, chronic disease 04/27/2014  . Occult blood positive stool 04/17/2014  . Edema 04/03/2014  . Unspecified hereditary and idiopathic peripheral neuropathy 03/14/2014  . Acute and chronic respiratory failure 02/14/2014  . Type II or unspecified type diabetes mellitus with neurological manifestations, uncontrolled(250.62) 02/14/2014  . Respiratory failure ([place]) 02/12/2014  . Altered mental status 02/12/2014  . Acute encephalopathy 02/12/2014  . Acute respiratory failure with hypercapnia ([place]) 02/12/2014  . Aortic stenosis 02/04/2014  . Closed left subtrochanteric femur fracture ([place]) 02/03/2014  . Hip fracture, left ([place]) 02/03/2014  . Hypokalemia 02/03/2014  . Fibula fracture 02/03/2014  . MTP instability 02/03/2014  . Hip fracture ([place]) 02/03/2014  . HTN (hypertension)   . Type 2 diabetes mellitus without complication, without long-term current use of insulin ([place])   . HLD (hyperlipidemia)     Past Surgical History:  Procedure Laterality Date  . BYPASS  GRAFT POPLITEAL TO TIBIAL Right 02/28/2016   Procedure: BYPASS GRAFT RIGHT BELOW KNEE POPLITEAL TO PERONEAL USING REVERSED RIGHT GREATER SAPHENOUS VEIN;  Surgeon: Conrad Toronto, MD;  Location: [facility];  Service: Vascular;  Laterality: Right;  . CYST EXCISION     Abdomen  . EYE SURGERY Bilateral    Cataract removal  . INTRAMEDULLARY (IM) NAIL INTERTROCHANTERIC Left 02/04/2014   Procedure: INTRAMEDULLARY (IM) NAIL INTERTROCHANTRIC FEMORAL;  Surgeon: Mauri Pole, MD;  Location: [facility];  Service: Orthopedics;  Laterality: Left;  . IR GENERIC  HISTORICAL  03/01/2016   IR ANGIO INTRA EXTRACRAN SEL COM CAROTID INNOMINATE UNI R MOD SED 03/01/2016 [name], MD [facility]  . IR GENERIC HISTORICAL  03/01/2016   IR ENDOVASC INTRACRANIAL INF OTHER THAN THROMBO ART INC DIAG ANGIO 03/01/2016 [name], MD [facility]  . IR GENERIC HISTORICAL  03/01/2016   IR INTRAVSC STENT CERV CAROTID W/O EMB-PROT MOD SED INC ANGIO 03/01/2016 [name], MD [facility]  . IR GENERIC HISTORICAL  06/03/2016   IR RADIOLOGIST EVAL & MGMT 06/03/2016 [facility]  . ORIF TOE FRACTURE Right 02/08/2014   Procedure: OPEN REDUCTION INTERNAL FIXATION Right METATARSAL  FRACTURE ;  Surgeon: Wylene Simmer, MD;  Location: [facility];  Service: Orthopedics;  Laterality: Right;  . PERIPHERAL VASCULAR CATHETERIZATION N/A 12/28/2015   Procedure: Abdominal Aortogram w/Lower Extremity;  Surgeon: Conrad Dalton, MD;  Location: [facility];  Service: Cardiovascular;  Laterality: N/A;  . RADIOLOGY WITH ANESTHESIA N/A 03/01/2016   Procedure: RADIOLOGY WITH ANESTHESIA;  Surgeon: [name], MD;  Location: [facility];  Service: Radiology;  Laterality: N/A;  . VEIN HARVEST Right 02/28/2016   Procedure: RIGHT GREATER SAPHENOUS VEIN HARVEST;  Surgeon: Conrad Rural Hall, MD;  Location[facility];  Service: Vascular;  Laterality: Right;    OB History    Gravida Para Term Preterm AB Living   6 2 2   4 2    SAB TAB Ectopic Multiple Live Births   4               Home Medications    Prior to Admission medications   Medication Sig Start Date End Date Taking? Authorizing Provider  acetaminophen (TYLENOL) 500 MG tablet Take 500 mg by mouth every 6 (six) hours as needed for mild pain.    Historical Provider, MD  albuterol (PROVENTIL HFA;VENTOLIN HFA) 108 (90 Base) MCG/ACT inhaler Inhale 2 puffs into the lungs every 6 (six) hours as needed.    Historical Provider, MD  amLODipine (NORVASC) 10 MG tablet Take 1 tablet (10 mg total) by mouth daily. 03/13/16   Alvia Grove, PA-C    aspirin 325 MG tablet Take 1 tablet (325 mg total) by mouth daily with breakfast. 07/10/16   Holley Raring, MD  atorvastatin (LIPITOR) 20 MG tablet Take 1 tablet (20 mg total) by mouth daily. 03/13/16   Alvia Grove, PA-C  B-D INS SYRINGE 0.5CC/31GX5/16 31G X 5/16" 0.5 ML MISC  02/05/16   Historical Provider, MD  cetirizine (ZYRTEC) 10 MG tablet Take 10 mg by mouth daily.     Historical Provider, MD  cholecalciferol (VITAMIN D) 1000 units tablet Take 1,000 Units by mouth daily.    Historical Provider, MD  citalopram (CELEXA) 20 MG tablet Take 20 mg by mouth daily.    Historical Provider, MD  clopidogrel (PLAVIX) 75 MG tablet Take 1 tablet (75 mg total) by mouth daily with breakfast. 03/13/16   Alvia Grove, PA-C  collagenase (SANTYL) ointment Apply topically 2 (two) times daily. Apply to dorsum right foot wound daily. 03/13/16   Alvia Grove, PA-C  docusate sodium (COLACE) 100 MG capsule Take 100 mg by mouth 2 (two) times daily.    Historical Provider, MD  doxycycline (VIBRAMYCIN) 100 MG capsule Take 1 capsule (100 mg total) by mouth 2 (two) times daily. 07/01/16   Orbie Pyo, MD  fluticasone (FLONASE) 50 MCG/ACT nasal spray Place 1 spray into both nostrils daily.    Historical Provider, MD  furosemide (LASIX) 40 MG tablet Take 80 mg by mouth 2 (two) times daily.  01/08/16   Historical Provider, MD  insulin aspart (NOVOLOG) 100 UNIT/ML injection Inject 0-15 Units into the skin 3 (three) times daily with meals. Before each meal 3 times a day, 140-199 - 3 units, 200-250 - 5 units, 251-299 - 8 units,  300-349 - 12 units,  350 or above 15 units. Patient taking differently: Inject 12 Units into the skin 3 (three) times daily with meals.  02/09/14   Thurnell Lose, MD  insulin glargine (LANTUS) 100 UNIT/ML injection Inject 0.15 mLs (15 Units total) into the skin 2 (two) times daily. Patient taking differently: Inject 30 Units into the skin at bedtime.  03/13/16   Alvia Grove, PA-C   isosorbide mononitrate (IMDUR) 60 MG 24 hr tablet Take 1 tablet (60 mg total) by mouth daily. 07/10/16   Holley Raring, MD  losartan (COZAAR) 100 MG tablet Take 1 tablet (100 mg total) by mouth daily. 02/13/14   Samuella Cota, MD  metoprolol (LOPRESSOR) 50 MG tablet Take 1 tablet (50 mg total) by mouth 2 (two) times daily. 07/09/16   Holley Raring, MD  nitroGLYCERIN (NITROSTAT) 0.4 MG SL tablet Place 1 tablet (0.4 mg total) under the tongue every 5 (five) minutes as needed for chest pain. 07/09/16   Holley Raring, MD  omeprazole (PRILOSEC) 20 MG capsule Take 20 mg by mouth daily.    Historical Provider, MD  polyethylene glycol (MIRALAX / GLYCOLAX) packet Take 17 g by mouth 2 (two) times daily. Patient taking differently: Take 17 g by mouth daily as needed for mild constipation.  02/09/14   Thurnell Lose, MD  potassium chloride SA (K-DUR,KLOR-CON) 20 MEQ tablet Take 20 mEq by mouth daily.    Historical Provider, MD  pregabalin (LYRICA) 50 MG capsule Take 50 mg by mouth 3 (three) times daily.    Historical Provider, MD  ranolazine (RANEXA) 500 MG 12 hr tablet Take 1 tablet (500 mg total) by mouth 2 (two) times daily. 07/09/16   Holley Raring, MD    Family History Family History  Problem Relation Age of Onset  . Diabetes Other     Social History Social History  Substance Use Topics  . Smoking status: Never Smoker  . Smokeless tobacco: Never Used     Comment: Never smoked  . Alcohol use No     Allergies   Iodine; Penicillins; Shellfish allergy; Shellfish-derived products; Sulfa antibiotics; Sulfacetamide sodium; Sulfasalazine; Atorvastatin; and Eggs or egg-derived products   Review of Systems Review of Systems  Unable to perform ROS: Mental status change   Physical Exam Updated Vital Signs BP (!) 168/49 (BP Location: Left Arm)   Pulse 97   Temp 101.9 F (38.8 C) (Oral)   Resp 20   Ht 5\' 4"  (1.626 m)   Wt 213 lb (96.6 kg)   SpO2 96%   BMI 36.56 kg/m   Physical Exam  Constitutional: She appears well-developed and well-nourished.  HENT:  Head: Normocephalic and atraumatic.  Eyes: EOM are normal. Pupils are equal, round, and reactive to light.  Neck: Normal range of motion. Neck supple. No JVD present.  Cardiovascular: Normal rate and regular rhythm.   Murmur heard.  Systolic murmur is present with a grade of 2/6  2/6 high pitched systolic murmur heard at the cardiac base.   Pulmonary/Chest: Effort normal and breath sounds normal. She has no wheezes. She has no rales. She exhibits no tenderness.  Abdominal: Soft. Bowel sounds are normal. She exhibits no distension and no mass. There is no tenderness.  Musculoskeletal: Normal range of motion. She exhibits no edema.  2+ pretibial edema. Chronic ulcer to the right lower leg which does not appear overtly infected. Incision to right lower leg with area of ulceration that does not appear infected. Shallow ulcer on right foot which does not appear overtly infected   Lymphadenopathy:    She has no cervical adenopathy.  Neurological: She is alert. No cranial nerve deficit. She exhibits normal muscle tone. Coordination normal.  Oriented to person but not place or time.  Skin: Skin is warm and dry. No rash noted.  Psychiatric: She has a normal mood and affect. Her behavior is normal. Judgment and thought content normal.  Nursing note and vitals reviewed.   ED Treatments / Results  DIAGNOSTIC STUDIES:  Oxygen Saturation is 96% on RA, normal by my interpretation.    COORDINATION OF CARE:  1:12 AM Discussed treatment plan with pt at bedside and pt agreed to plan.  Labs (all labs ordered are listed, but only abnormal results are displayed) Labs Reviewed  COMPREHENSIVE METABOLIC PANEL - Abnormal; Notable for the following:       Result Value   Chloride 99 (*)    Glucose, Bld 263 (*)    BUN 21 (*)    Albumin 3.4 (*)    GFR calc non Af Amer 58 (*)    All other components within normal limits  CBC WITH  DIFFERENTIAL/PLATELET - Abnormal; Notable for the following:    WBC 13.8 (*)    Hemoglobin 11.3 (*)    HCT 35.3 (*)    RDW 16.0 (*)    Neutro Abs 12.7 (*)    Lymphs Abs 0.5 (*)    All other components within normal limits  I-STAT CG4 LACTIC ACID, ED - Abnormal; Notable for the following:    Lactic Acid, Venous 2.77 (*)    All other components within normal limits  CULTURE, BLOOD (ROUTINE X 2)  CULTURE, BLOOD (ROUTINE X 2)  URINE CULTURE  URINALYSIS, ROUTINE W REFLEX MICROSCOPIC  I-STAT CG4 LACTIC ACID, ED    EKG  EKG Interpretation  Date/Time:  Thursday August 29 2016 00:25:44 EST Ventricular Rate:  98 PR Interval:    QRS Duration: 88 QT Interval:  317 QTC Calculation: 405 R Axis:   82 Text Interpretation:  Sinus rhythm Borderline right axis deviation Nonspecific repol abnormality, diffuse leads Baseline wander in lead(s) III aVF When compared with ECG of 07/24/2016, Nonspecific ST and T wave abnormality are now present Confirmed by [facility]  MD, Kashif Pooler ([id number]) on 08/29/2016 12:36:27 AM       Radiology Dg Chest Port 1 View  Result Date: 08/29/2016 CLINICAL DATA:  Fever EXAM: PORTABLE CHEST 1 VIEW COMPARISON:  07/05/2016 FINDINGS: Heart is enlarged. There is aortic atherosclerosis. There is atelectasis bilaterally. Minimal superimposed airspace opacities in the right upper and both lower lobes cannot  exclude pneumonia. No acute osseous abnormality. IMPRESSION: Stable cardiomegaly. Atelectasis. Patchy right upper and bibasilar areas of superimposed airspace disease may reflect minimal pneumonia. Electronically Signed   By: Ashley Royalty M.D.   On: 08/29/2016 01:40    Procedures Procedures (including critical care time) CENTRAL LINE Performed by: [id number] Consent: The procedure was performed in an emergent situation. Required items: required blood products, implants, devices, and special equipment available Patient identity confirmed: arm band and provided demographic  data Time out: Immediately prior to procedure a "time out" was called to verify the correct patient, procedure, equipment, support staff and site/side marked as required. Indications: vascular access Anesthesia: local infiltration Local anesthetic: lidocaine 1% with epinephrine Anesthetic total: 3 ml Patient sedated: no Preparation: skin prepped with 2% chlorhexidine Skin prep agent dried: skin prep agent completely dried prior to procedure Sterile barriers: all five maximum sterile barriers used - cap, mask, sterile gown, sterile gloves, and large sterile sheet Hand hygiene: hand hygiene performed prior to central venous catheter insertion  Location details: Right femoral vein   Catheter type: triple lumen Catheter size: 8 Fr Pre-procedure: landmarks identified Ultrasound guidance: no Successful placement: yes Post-procedure: line sutured and dressing applied Assessment: blood return through all parts, free fluid flow Patient tolerance: Patient tolerated the procedure well with no immediate complications.  CRITICAL CARE Performed by: Delora Fuel Total critical care time: 55 minutes Critical care time was exclusive of separately billable procedures and treating other patients. Critical care was necessary to treat or prevent imminent or life-threatening deterioration. Critical care was time spent personally by me on the following activities: development of treatment plan with patient and/or surrogate as well as nursing, discussions with consultants, evaluation of patient's response to treatment, examination of patient, obtaining history from patient or surrogate, ordering and performing treatments and interventions, ordering and review of laboratory studies, ordering and review of radiographic studies, pulse oximetry and re-evaluation of patient's condition.  Medications Ordered in ED Medications  sodium chloride 0.9 % bolus 1,000 mL (0 mLs Intravenous Hold 08/29/16 0313)  sodium  chloride 0.9 % bolus 1,000 mL (0 mLs Intravenous Hold 08/29/16 0313)  vancomycin (VANCOCIN) 1,500 mg in sodium chloride 0.9 % 500 mL IVPB (not administered)  aztreonam (AZACTAM) injection 1 g (not administered)  levofloxacin (LEVAQUIN) IVPB 750 mg (not administered)  aztreonam (AZACTAM) 2 g in dextrose 5 % 50 mL IVPB (not administered)     Initial Impression / Assessment and Plan / ED Course  I have reviewed the triage vital signs and the nursing notes.  Pertinent labs & imaging results that were available during my care of the patient were reviewed by me and considered in my medical decision making (see chart for details).  Patient presenting with fever and altered mentation. She has known chronic wounds of the right leg but they do not appear overtly infected. Source of infection is not clear. She started on antibiotics for sepsis of unknown source. Sepsis protocol was activated. She IV access was difficult to obtain. IV team was unable to start IV and I was unable to start an external jugular line. Finally, central line was started for IV fluids and antibiotics. Case is discussed with Dr. Alcario Drought of [facility] who agrees to admit the patient. She is running an elevated blood pressure and Dr. Vale Haven requested CT of the head which is ordered. Old records are reviewed and she has ED visits for cellulitis but no other relevant visits.  Final Clinical Impressions(s) / ED Diagnoses   Final  diagnoses:  Sepsis, due to unspecified organism ([place])  Altered mental status, unspecified altered mental status type  Essential hypertension  Normochromic normocytic anemia    New Prescriptions New Prescriptions   No medications on file    I personally performed the services described in this documentation, which was scribed in my presence. The recorded information has been reviewed and is accurate.      Delora Fuel, MD [phone] [phone]

## 2016-08-29 NOTE — ED Notes (Signed)
istat lactic acid results given to [place]

## 2016-08-29 NOTE — ED Notes (Signed)
Pt states son and husband both had the flu recently.

## 2016-08-29 NOTE — ED Notes (Signed)
Callie, RN has assessed patient's arms, and also is unable to locate a suitable vein for IV placement.

## 2016-08-29 NOTE — ED Notes (Signed)
EJ IV attempt unsuccessful.

## 2016-08-29 NOTE — ED Notes (Signed)
Located IV team RN, and inquired if he was aware of this patient's need for IV start. IV team RN indicated that he is coming to patient bedside next.

## 2016-08-29 NOTE — ED Notes (Signed)
Report called to [facility] -- can transport after 1930.

## 2016-08-29 NOTE — ED Notes (Signed)
IV team informed this RN that he attempted with and without US guidance to obtain IV access, but has not been successful. MD Roxanne Mins informed.

## 2016-08-29 NOTE — Consult Note (Addendum)
[facility] Nurse wound consult note Reason for Consult: Consult requested for right foot and leg wounds.  Pt is unable to state what topical treatment she was previously using. Wound type: Full thickness wounds to right foot and calf Right heel with deep tissue injury; 2X1cm, dark purple with intact skin. Pressure Injury POA: Yes Measurement: Right anterior great toe with chronic full thickness wound, 2.8X.2X.1cm, dry yellow wound bed, no odor, drainage, or fluctuance Right lower calf with chronic full thickness stasis ulcer; 4X6X.1cm, 90% red, 10% yellow, small amt yellow drainage, no odor. Right middle calf with 2 full thickness wound; 3.8X.3X.3cm, 85% red, 15% yellow, small amt yellow drainage, no odor. Periwound: Intact skin surrounding Dressing procedure/placement/frequency: Bactroban to promote moist healing, foam dressing to protect from further injury. Float heel to reduce pressure to deep tissue injury location. No family members present to discuss plan of care. Please re-consult if further assistance is needed.  Thank-you,  Julien Girt MSN, [place], [place], [place], [place]

## 2016-08-29 NOTE — ED Triage Notes (Signed)
Pt brought in from home via EMS after the pts husband states that she "wasnt acting right" and not herself. Per EMS the husband was unable to give a LKW but states symptoms started between 1730-2000.

## 2016-08-29 NOTE — Progress Notes (Signed)
Attempted PIV x 2 surface vein unsuccessfully.  Assess both arms with ultrasound for PIV.  Unable to find suitable vein.  Assessed upper arms deeper veins for possible power wand/ midline.  Unable to get right arm out due to contraction/pain secondary to stroke.  Left arm basilic and brachial veins too small.  Unable to locate cephalic veins.  Veins also would be probably difficult or too small for PICC line insertion.

## 2016-08-29 NOTE — ED Notes (Signed)
Supplies at bedside. MD informed.

## 2016-08-29 NOTE — ED Notes (Signed)
Patient c/o right foot pain. Pillow repositioned, additional folded sheets for repositioning used. Patient indicated foot felt better. Asking for water.

## 2016-08-29 NOTE — ED Notes (Signed)
MD Roxanne Mins to bedside to attempt EJ IV.

## 2016-08-29 NOTE — ED Notes (Signed)
MD Roxanne Mins advised to prepare for femoral central line placement.

## 2016-08-29 NOTE — ED Notes (Signed)
This RN has assessed both of patient's upper extremities with and without ultrasound. Unable to locate vein which is suitable for IV access attempt. MD Roxanne Mins informed. Annamary Carolin, RN asked to assess patient for IV site. If unsuccessful MD Roxanne Mins advised to place IV team consult.

## 2016-08-29 NOTE — Progress Notes (Signed)
Pharmacy Antibiotic Note  [name] is a 76 y.o. female admitted on 08/28/2016 with AMS/pneumonia.  Pharmacy has been consulted for Vancomycin, Aztreonam, and Levaquin dosing.  **noted that abx delayed due to inability to get IV site**  Plan: Aztreonam 2gm IV q8h Vancomycin 1500mg  IV now then 750mg  IV q12h Levaquin 750mg  IV q24h Will f/u micro data, renal function, and pt's clinical condition Vanc trough prn   Height: 5\' 4"  (162.6 cm) Weight: 213 lb (96.6 kg) IBW/kg (Calculated) : 54.7  Temp (24hrs), Avg:101.9 F (38.8 C), Min:101.9 F (38.8 C), Max:101.9 F (38.8 C)   Recent Labs Lab 08/29/16 0120 08/29/16 0133  WBC 13.8*  --   CREATININE 0.95  --   LATICACIDVEN  --  2.77*    Estimated Creatinine Clearance: 59.5 mL/min (by C-G formula based on SCr of 0.95 mg/dL).    Allergies  Allergen Reactions  . Iodine Anaphylaxis  . Penicillins Anaphylaxis  . Shellfish Allergy Anaphylaxis  . Shellfish-Derived Products Anaphylaxis  . Sulfa Antibiotics Anaphylaxis  . Sulfacetamide Sodium Anaphylaxis  . Sulfasalazine Anaphylaxis  . Atorvastatin     Other reaction(s): Unknown  . Eggs Or Egg-Derived Products     Other reaction(s): SHORTNESS OF BREATH    Antimicrobials this admission: 1/25 Vanc >>  1/25 Aztreonam >>  1/25 Levaquin >>  Dose adjustments this admission: n/a  Microbiology results: 1/25 BCx x2:   UCx:    Sputum:    Thank you for allowing pharmacy to be a part of this patient's care.  Sherlon Handing, PharmD, BCPS Clinical pharmacist, pager [phone] 08/29/2016 5:39 AM

## 2016-08-29 NOTE — Progress Notes (Signed)
Patient seen and examined this morning, admitted overnight by Dr. Alcario Drought, H&P reviewed, agree with A&P  In brief, complex vascular patient with history of PAD, DM, HTN, prior CVA with residual right sided weakness admitted with sepsis, ?Pneumonia vs RLE wound  Sepsis protocol, antibiotics with Vancomycin / Aztreonam / Levaquin. Flu r/o. Empiric Tamiflu. Received fluid boluses in the ER, continue IVF continuous. ABG reassuring. Lactic acid elevated but had IV access difficulties and bolus was started 2 hours ago. Will monitor lactic acid.  Closely monitor in SDU today  Tyshawn Keel M. Cruzita Lederer, MD [facility] [phone]

## 2016-08-29 NOTE — ED Notes (Signed)
Now attempting to start IV via US guidance.

## 2016-08-29 NOTE — H&P (Signed)
History and Physical    [name] [id number] DOB: Apr 15, 1941 DOA: 08/28/2016   PCP: [name], MD Chief Complaint:  Chief Complaint  Patient presents with  . Altered Mental Status    HPI: [name] is a 76 y.o. female with medical history significant of DM2, HTN.  Patient presents to the ED via EMS with CC of AMS.  Patient was noted to be altered at 8:30 pm yesterday.  Patients husband states patient hasnt been acting right and has been acting very tired.  They note a chronic wound on RLE.  Not really able to give a LKW but states symptoms started between 1730-2000 last evening.  ED Course: Patient noted to be febrile to 101.9 on presentation.  WBC 13.8k, lactate of 2.7.  CXR suggests mild diffuse PNA.  No new O2 requirement.  Due to lack of access, femoral line was put in.  No sick contacts per husband.  Patient does wake up some: denies headache, denies neck stiffness, complains that her mouth is dry and demands something to drink.  Denies SOB.  Review of Systems: Not really able to perform due to patients AMS   Past Medical History:  Diagnosis Date  . Acute heart failure ([place])   . Aortic stenosis   . Complication of anesthesia    Hard to wake patient up after having anesthesia  . Diabetes mellitus without complication ([place])   . Diabetic neuropathy (H[facility])    Takes Lyrica  . Edema of both legs    Takes Lasix  . GERD (gastroesophageal reflux disease)   . High cholesterol   . HTN (hypertension)   . Hypertension   . PAD (peripheral artery disease) ([place])   . Shortness of breath dyspnea   . Spasm of back muscles   . Stroke ([place])   . Wound, open    Right great toe    Past Surgical History:  Procedure Laterality Date  . BYPASS GRAFT POPLITEAL TO TIBIAL Right 02/28/2016   Procedure: BYPASS GRAFT RIGHT BELOW KNEE POPLITEAL TO PERONEAL USING REVERSED RIGHT GREATER SAPHENOUS VEIN;  Surgeon: Conrad Fulshear, MD;  Location: [facility];  Service: Vascular;  Laterality: Right;   . CYST EXCISION     Abdomen  . EYE SURGERY Bilateral    Cataract removal  . INTRAMEDULLARY (IM) NAIL INTERTROCHANTERIC Left 02/04/2014   Procedure: INTRAMEDULLARY (IM) NAIL INTERTROCHANTRIC FEMORAL;  Surgeon: Mauri Pole, MD;  Location: [facility];  Service: Orthopedics;  Laterality: Left;  . IR GENERIC HISTORICAL  03/01/2016   IR ANGIO INTRA EXTRACRAN SEL COM CAROTID INNOMINATE UNI R MOD SED 03/01/2016 [name], MD [facility]  . IR GENERIC HISTORICAL  03/01/2016   IR ENDOVASC INTRACRANIAL INF OTHER THAN THROMBO ART INC DIAG ANGIO 03/01/2016 [name], MD [facility]  . IR GENERIC HISTORICAL  03/01/2016   IR INTRAVSC STENT CERV CAROTID W/O EMB-PROT MOD SED INC ANGIO 03/01/2016 [name], MD [facility]  . IR GENERIC HISTORICAL  06/03/2016   IR RADIOLOGIST EVAL & MGMT 06/03/2016 [facility]  . ORIF TOE FRACTURE Right 02/08/2014   Procedure: OPEN REDUCTION INTERNAL FIXATION Right METATARSAL  FRACTURE ;  Surgeon: Wylene Simmer, MD;  Location: [facility];  Service: Orthopedics;  Laterality: Right;  . PERIPHERAL VASCULAR CATHETERIZATION N/A 12/28/2015   Procedure: Abdominal Aortogram w/Lower Extremity;  Surgeon: Conrad Minerva Park, MD;  Location: [facility];  Service: Cardiovascular;  Laterality: N/A;  . RADIOLOGY WITH ANESTHESIA N/A 03/01/2016   Procedure: RADIOLOGY  WITH ANESTHESIA;  Surgeon: [name], MD;  Location: [facility];  Service: Radiology;  Laterality: N/A;  . VEIN HARVEST Right 02/28/2016   Procedure: RIGHT GREATER SAPHENOUS VEIN HARVEST;  Surgeon: Conrad Byron, MD;  Location: [facility];  Service: Vascular;  Laterality: Right;     reports that she has never smoked. She has never used smokeless tobacco. She reports that she does not drink alcohol or use drugs.  Allergies  Allergen Reactions  . Iodine Anaphylaxis  . Penicillins Anaphylaxis  . Shellfish Allergy Anaphylaxis  . Shellfish-Derived Products Anaphylaxis  . Sulfa Antibiotics Anaphylaxis  . Sulfacetamide  Sodium Anaphylaxis  . Sulfasalazine Anaphylaxis  . Atorvastatin     Other reaction(s): Unknown  . Eggs Or Egg-Derived Products     Other reaction(s): SHORTNESS OF BREATH    Family History  Problem Relation Age of Onset  . Diabetes Other       Prior to Admission medications   Medication Sig Start Date End Date Taking? Authorizing Provider  acetaminophen (TYLENOL) 500 MG tablet Take 500 mg by mouth every 6 (six) hours as needed for mild pain.    Historical Provider, MD  albuterol (PROVENTIL HFA;VENTOLIN HFA) 108 (90 Base) MCG/ACT inhaler Inhale 2 puffs into the lungs every 6 (six) hours as needed.    Historical Provider, MD  amLODipine (NORVASC) 10 MG tablet Take 1 tablet (10 mg total) by mouth daily. 03/13/16   Alvia Grove, PA-C  aspirin 325 MG tablet Take 1 tablet (325 mg total) by mouth daily with breakfast. 07/10/16   Holley Raring, MD  atorvastatin (LIPITOR) 20 MG tablet Take 1 tablet (20 mg total) by mouth daily. 03/13/16   Alvia Grove, PA-C  B-D INS SYRINGE 0.5CC/31GX5/16 31G X 5/16" 0.5 ML MISC  02/05/16   Historical Provider, MD  cetirizine (ZYRTEC) 10 MG tablet Take 10 mg by mouth daily.     Historical Provider, MD  cholecalciferol (VITAMIN D) 1000 units tablet Take 1,000 Units by mouth daily.    Historical Provider, MD  citalopram (CELEXA) 20 MG tablet Take 20 mg by mouth daily.    Historical Provider, MD  clopidogrel (PLAVIX) 75 MG tablet Take 1 tablet (75 mg total) by mouth daily with breakfast. 03/13/16   Alvia Grove, PA-C  collagenase (SANTYL) ointment Apply topically 2 (two) times daily. Apply to dorsum right foot wound daily. 03/13/16   Alvia Grove, PA-C  docusate sodium (COLACE) 100 MG capsule Take 100 mg by mouth 2 (two) times daily.    Historical Provider, MD  doxycycline (VIBRAMYCIN) 100 MG capsule Take 1 capsule (100 mg total) by mouth 2 (two) times daily. 07/01/16   Orbie Pyo, MD  fluticasone (FLONASE) 50 MCG/ACT nasal spray Place 1 spray into  both nostrils daily.    Historical Provider, MD  furosemide (LASIX) 40 MG tablet Take 80 mg by mouth 2 (two) times daily.  01/08/16   Historical Provider, MD  insulin aspart (NOVOLOG) 100 UNIT/ML injection Inject 0-15 Units into the skin 3 (three) times daily with meals. Before each meal 3 times a day, 140-199 - 3 units, 200-250 - 5 units, 251-299 - 8 units,  300-349 - 12 units,  350 or above 15 units. Patient taking differently: Inject 12 Units into the skin 3 (three) times daily with meals.  02/09/14   Thurnell Lose, MD  insulin glargine (LANTUS) 100 UNIT/ML injection Inject 0.15 mLs (15 Units total) into the skin 2 (two) times daily. Patient taking differently:  Inject 30 Units into the skin at bedtime.  03/13/16   Alvia Grove, PA-C  isosorbide mononitrate (IMDUR) 60 MG 24 hr tablet Take 1 tablet (60 mg total) by mouth daily. 07/10/16   Holley Raring, MD  losartan (COZAAR) 100 MG tablet Take 1 tablet (100 mg total) by mouth daily. 02/13/14   Samuella Cota, MD  metoprolol (LOPRESSOR) 50 MG tablet Take 1 tablet (50 mg total) by mouth 2 (two) times daily. 07/09/16   Holley Raring, MD  nitroGLYCERIN (NITROSTAT) 0.4 MG SL tablet Place 1 tablet (0.4 mg total) under the tongue every 5 (five) minutes as needed for chest pain. 07/09/16   Holley Raring, MD  omeprazole (PRILOSEC) 20 MG capsule Take 20 mg by mouth daily.    Historical Provider, MD  polyethylene glycol (MIRALAX / GLYCOLAX) packet Take 17 g by mouth 2 (two) times daily. Patient taking differently: Take 17 g by mouth daily as needed for mild constipation.  02/09/14   Thurnell Lose, MD  potassium chloride SA (K-DUR,KLOR-CON) 20 MEQ tablet Take 20 mEq by mouth daily.    Historical Provider, MD  pregabalin (LYRICA) 50 MG capsule Take 50 mg by mouth 3 (three) times daily.    Historical Provider, MD  ranolazine (RANEXA) 500 MG 12 hr tablet Take 1 tablet (500 mg total) by mouth 2 (two) times daily. 07/09/16   Holley Raring, MD    Physical  Exam: Vitals:   08/29/16 0230 08/29/16 0300 08/29/16 0308 08/29/16 0330  BP:   163/61 (!) 189/150  Pulse: 103 103 104 104  Resp: 22 23 22 24   Temp:      TempSrc:      SpO2: 100% 96% 97% 94%  Weight:      Height:          Constitutional: NAD, calm, comfortable Eyes: PERRL, lids and conjunctivae normal ENMT: Mucous membranes are moist. Posterior pharynx clear of any exudate or lesions.Normal dentition.  Neck: normal, supple, no masses, no thyromegaly Respiratory: clear to auscultation bilaterally, no wheezing, no crackles. Normal respiratory effort. No accessory muscle use.  Cardiovascular: Regular rate and rhythm, 2/6 systolic murmur/ rubs / gallops. No extremity edema. 2+ pedal pulses. No carotid bruits.  Abdomen: no tenderness, no masses palpated. No hepatosplenomegaly. Bowel sounds positive.  Musculoskeletal: no clubbing / cyanosis. No joint deformity upper and lower extremities. Good ROM, no contractures. Normal muscle tone.  Skin: Chronic ulcer RLE that doesn't appear infected, shallow ulcer on R foot which actually appears well healed. Neurologic: CN 2-12 grossly intact. Sensation intact, DTR normal. Strength 5/5 in all 4.  Psychiatric: Oriented to person but not place nor time, unable to give answers to most questions.  Falls asleep easily on exam.   Labs on Admission: I have personally reviewed following labs and imaging studies  CBC:  Recent Labs Lab 08/29/16 0120  WBC 13.8*  NEUTROABS 12.7*  HGB 11.3*  HCT 35.3*  MCV 81.1  PLT [id number]   Basic Metabolic Panel:  Recent Labs Lab 08/29/16 0120  NA 135  K 3.5  CL 99*  CO2 22  GLUCOSE 263*  BUN 21*  CREATININE 0.95  CALCIUM 9.1   GFR: Estimated Creatinine Clearance: 59.5 mL/min (by C-G formula based on SCr of 0.95 mg/dL). Liver Function Tests:  Recent Labs Lab 08/29/16 0120  AST 26  ALT 27  ALKPHOS 69  BILITOT 0.8  PROT 6.7  ALBUMIN 3.4*   No results for input(s): LIPASE, AMYLASE in the last 168  hours. No results for input(s): AMMONIA in the last 168 hours. Coagulation Profile: No results for input(s): INR, PROTIME in the last 168 hours. Cardiac Enzymes: No results for input(s): CKTOTAL, CKMB, CKMBINDEX, TROPONINI in the last 168 hours. BNP (last 3 results) No results for input(s): PROBNP in the last 8760 hours. HbA1C: No results for input(s): HGBA1C in the last 72 hours. CBG: No results for input(s): GLUCAP in the last 168 hours. Lipid Profile: No results for input(s): CHOL, HDL, LDLCALC, TRIG, CHOLHDL, LDLDIRECT in the last 72 hours. Thyroid Function Tests: No results for input(s): TSH, T4TOTAL, FREET4, T3FREE, THYROIDAB in the last 72 hours. Anemia Panel: No results for input(s): VITAMINB12, FOLATE, FERRITIN, TIBC, IRON, RETICCTPCT in the last 72 hours. Urine analysis:    Component Value Date/Time   COLORURINE YELLOW 02/28/2016 0703   APPEARANCEUR CLEAR 02/28/2016 0703   LABSPEC 1.006 02/28/2016 0703   PHURINE 6.5 02/28/2016 0703   GLUCOSEU NEGATIVE 02/28/2016 0703   HGBUR NEGATIVE 02/28/2016 0703   BILIRUBINUR NEGATIVE 02/28/2016 0703   KETONESUR NEGATIVE 02/28/2016 0703   PROTEINUR NEGATIVE 02/28/2016 0703   UROBILINOGEN 0.2 02/12/2014 0110   NITRITE NEGATIVE 02/28/2016 0703   LEUKOCYTESUR NEGATIVE 02/28/2016 0703   Sepsis Labs: @LABRCNTIP (procalcitonin:4,lacticidven:4) )No results found for this or any previous visit (from the past 240 hour(s)).   Radiological Exams on Admission: Dg Chest Port 1 View  Result Date: 08/29/2016 CLINICAL DATA:  Fever EXAM: PORTABLE CHEST 1 VIEW COMPARISON:  07/05/2016 FINDINGS: Heart is enlarged. There is aortic atherosclerosis. There is atelectasis bilaterally. Minimal superimposed airspace opacities in the right upper and both lower lobes cannot exclude pneumonia. No acute osseous abnormality. IMPRESSION: Stable cardiomegaly. Atelectasis. Patchy right upper and bibasilar areas of superimposed airspace disease may reflect minimal  pneumonia. Electronically Signed   By: Ashley Royalty M.D.   On: 08/29/2016 01:40    EKG: Independently reviewed.  Assessment/Plan Principal Problem:   Acute encephalopathy Active Problems:   DM2 (diabetes mellitus, type 2) (HCC)   Atherosclerosis of native arteries of the extremities with ulceration (HCC)   Essential hypertension   Ulcer of right lower extremity (HCC)   Sepsis (HCC)   Bilateral pneumonia    1. Acute encephalopathy - 1. Patient will be NPO for the moment including meds. 2. ABG doesn't show CO2 retention 3. Appears to be delirium secondary to sepsis 4. CT head negative for acute finding 5. Ammonia pending (but seems low yield) 6. If no significant rapid improvement in the next couple of hours, would obtain MRI brain. 2. B PNA resulting in sepsis - 1. Flu pnl pending 2. PNA pathway 3. Continue empiric vanc, aztreonam, levaquin 4. IVF: 2L bolus ordered 5. Tele monitor for mild tachycardia 6. Serial lactates, up trend is noted on 2nd lactate but IV access and fluid bolus wasn't obtained until around 0500.  IVF bolus now going. 1. Will give PCCM a heads up to keep an eye on patient who I still plan on taking to SDU initially given up trending lactate, AMS, etc.  But at the moment, no hard indications for ICU admission. 3. HTN - 1. PRN hydralazine ordered for the moment as she isnt awake enough to take PO meds 2. Plan to re-order home meds once she is awake enough to take these 4. DM2 - 1. Half home lantus 2. Sensitive scale SSI Q4H 5. RLE ulcer - 1. dosent appear infected 2. Wound care consult 6. H/o PAD - resume ASA and Plavix when able to take PO meds  DVT prophylaxis: Lovenox Code Status: Full Family Communication: Husband at bedside Consults called: Gave heads up to PCCM given up trend of lactate Admission status: Admit to inpatient   Etta Quill DO [facility] Pager [phone] from 7PM-7AM  If 7AM-7PM, please contact the day physician  for the patient [email or web address] Password [email or web address]  08/29/2016, 5:06 AM

## 2016-08-29 NOTE — ED Notes (Signed)
Assessed patient's arms for IV site. Unable to locate any suitable vein.

## 2016-08-29 NOTE — Progress Notes (Signed)
PHARMACY - PHYSICIAN COMMUNICATION CRITICAL VALUE ALERT - BLOOD CULTURE IDENTIFICATION (BCID)  Results for orders placed or performed during the hospital encounter of 08/28/16  Blood Culture ID Panel (Reflexed) (Collected: 08/29/2016  1:27 AM)  Result Value Ref Range   Enterococcus species NOT DETECTED NOT DETECTED   Listeria monocytogenes NOT DETECTED NOT DETECTED   Staphylococcus species NOT DETECTED NOT DETECTED   Staphylococcus aureus NOT DETECTED NOT DETECTED   Streptococcus species DETECTED (A) NOT DETECTED   Streptococcus agalactiae DETECTED (A) NOT DETECTED   Streptococcus pneumoniae NOT DETECTED NOT DETECTED   Streptococcus pyogenes NOT DETECTED NOT DETECTED   Acinetobacter baumannii NOT DETECTED NOT DETECTED   Enterobacteriaceae species NOT DETECTED NOT DETECTED   Enterobacter cloacae complex NOT DETECTED NOT DETECTED   Escherichia coli NOT DETECTED NOT DETECTED   Klebsiella oxytoca NOT DETECTED NOT DETECTED   Klebsiella pneumoniae NOT DETECTED NOT DETECTED   Proteus species NOT DETECTED NOT DETECTED   Serratia marcescens NOT DETECTED NOT DETECTED   Haemophilus influenzae NOT DETECTED NOT DETECTED   Neisseria meningitidis NOT DETECTED NOT DETECTED   Pseudomonas aeruginosa NOT DETECTED NOT DETECTED   Candida albicans NOT DETECTED NOT DETECTED   Candida glabrata NOT DETECTED NOT DETECTED   Candida krusei NOT DETECTED NOT DETECTED   Candida parapsilosis NOT DETECTED NOT DETECTED   Candida tropicalis NOT DETECTED NOT DETECTED   1/2 Blood cultures positive for Group B. Strep. The drug of choice is penicillin; however, the patient has a noted anaphylactic reaction to penicillins. As such, continue vancomycin. Due to severity of illness, will also continue levaquin, but discontinue aztreonam as it provides no additional coverage.   Name of physician (or Provider) Contacted: Dr. Marzetta Board  Changes to prescribed antibiotics required: Continue vancomycin and levaquin,  discontinue aztreonam.   Dierdre Harness, BS, PharmD Clinical Pharmacy Resident [phone] (Pager) 08/29/2016 3:57 PM

## 2016-08-30 ENCOUNTER — Inpatient Hospital Stay (HOSPITAL_COMMUNITY): Payer: Medicare HMO

## 2016-08-30 LAB — URINE CULTURE: Culture: NO GROWTH

## 2016-08-30 LAB — GLUCOSE, CAPILLARY
GLUCOSE-CAPILLARY: 121 mg/dL — AB (ref 65–99)
GLUCOSE-CAPILLARY: 148 mg/dL — AB (ref 65–99)
GLUCOSE-CAPILLARY: 203 mg/dL — AB (ref 65–99)
GLUCOSE-CAPILLARY: 221 mg/dL — AB (ref 65–99)
Glucose-Capillary: 160 mg/dL — ABNORMAL HIGH (ref 65–99)
Glucose-Capillary: 234 mg/dL — ABNORMAL HIGH (ref 65–99)
Glucose-Capillary: 188 mg/dL — ABNORMAL HIGH (ref 65–99)

## 2016-08-30 MED ORDER — ATORVASTATIN CALCIUM 20 MG PO TABS
20.0000 mg | ORAL_TABLET | Freq: Every day | ORAL | Status: DC
  Administered 2016-08-31 – 2016-09-17 (×18): 20 mg via ORAL
  Filled 2016-08-30 (×18): qty 1

## 2016-08-30 MED ORDER — CARVEDILOL 12.5 MG PO TABS
12.5000 mg | ORAL_TABLET | Freq: Two times a day (BID) | ORAL | Status: DC
  Administered 2016-08-31 – 2016-09-17 (×35): 12.5 mg via ORAL
  Filled 2016-08-30 (×34): qty 1

## 2016-08-30 MED ORDER — FUROSEMIDE 10 MG/ML IJ SOLN
40.0000 mg | Freq: Once | INTRAMUSCULAR | Status: AC
  Administered 2016-08-30: 40 mg via INTRAVENOUS

## 2016-08-30 MED ORDER — AMLODIPINE BESYLATE 10 MG PO TABS
10.0000 mg | ORAL_TABLET | Freq: Every day | ORAL | Status: DC
  Administered 2016-08-31 – 2016-09-17 (×18): 10 mg via ORAL
  Filled 2016-08-30 (×18): qty 1

## 2016-08-30 MED ORDER — ALBUTEROL SULFATE (2.5 MG/3ML) 0.083% IN NEBU
5.0000 mg | INHALATION_SOLUTION | Freq: Once | RESPIRATORY_TRACT | Status: AC
  Administered 2016-08-30: 5 mg via RESPIRATORY_TRACT

## 2016-08-30 MED ORDER — LORAZEPAM 2 MG/ML IJ SOLN
0.5000 mg | INTRAMUSCULAR | Status: DC | PRN
  Administered 2016-08-30 – 2016-09-13 (×9): 0.5 mg via INTRAVENOUS
  Filled 2016-08-30 (×9): qty 1

## 2016-08-30 MED ORDER — CLOPIDOGREL BISULFATE 75 MG PO TABS
75.0000 mg | ORAL_TABLET | Freq: Every day | ORAL | Status: DC
  Administered 2016-08-31 – 2016-09-17 (×17): 75 mg via ORAL
  Filled 2016-08-30 (×19): qty 1

## 2016-08-30 MED ORDER — RANOLAZINE ER 500 MG PO TB12
500.0000 mg | ORAL_TABLET | Freq: Two times a day (BID) | ORAL | Status: DC
  Administered 2016-08-31 – 2016-09-17 (×35): 500 mg via ORAL
  Filled 2016-08-30 (×35): qty 1

## 2016-08-30 MED ORDER — ISOSORBIDE MONONITRATE ER 60 MG PO TB24
60.0000 mg | ORAL_TABLET | Freq: Every day | ORAL | Status: DC
  Administered 2016-08-31 – 2016-09-17 (×18): 60 mg via ORAL
  Filled 2016-08-30 (×18): qty 1

## 2016-08-30 MED ORDER — FUROSEMIDE 10 MG/ML IJ SOLN
INTRAMUSCULAR | Status: AC
  Filled 2016-08-30: qty 4

## 2016-08-30 NOTE — Progress Notes (Signed)
BRIGETT, VERDEROSA ([id number]) Visit Report for 08/28/2016 Chief Complaint Document Details Patient Name: [name], [name] Date of Service: 08/28/2016 1:30 PM Medical Record Patient Account Number: [id number] Number: Treating RN: Ahmed Prima [birth year] (75 y.o. Other Clinician: Date of Birth/Sex: Female) Treating Tersea Aulds Primary Care Provider: Beverlyn Roux Provider/Extender: G Referring Provider: Dorthula Nettles in Treatment: 2 Information Obtained from: Patient Chief Complaint 08/13/16; patient is here for review of predominantly a wound on her right anterior lateral lower calf whenever our intake reveals a more superior right calf wound and 2 small open areas over the right great toe Electronic Signature(s) Signed: 08/28/2016 6:11:14 PM By: Linton Ham MD Entered By: Linton Ham on 08/28/2016 17:36:50 Corvera, Jamilyn V. ([id number]) -------------------------------------------------------------------------------- Debridement Details Patient Name: [name], [name] Date of Service: 08/28/2016 1:30 PM Medical Record Patient Account Number: [id number] Number: Treating RN: Ahmed Prima [birth year] (75 y.o. Other Clinician: Date of Birth/Sex: Female) Treating Lejon Afzal, [place] Primary Care Provider: Beverlyn Roux Provider/Extender: G Referring Provider: Dorthula Nettles in Treatment: 2 Debridement Performed for Wound #1 Right Lower Leg Assessment: Performed By: Physician Ricard Dillon, MD Debridement: Debridement Pre-procedure Yes - 14:38 Verification/Time Out Taken: Start Time: 14:39 Pain Control: Lidocaine 4% Topical Solution Level: Skin/Subcutaneous Tissue Total Area Debrided (L x 8 (cm) x 0.3 (cm) = 2.4 (cm) W): Tissue and other Viable, Non-Viable, Exudate, Fibrin/Slough, Subcutaneous material debrided: Instrument: Curette Bleeding: Minimum Hemostasis Achieved: Pressure End Time: 14:40 Procedural Pain: 0 Post Procedural Pain:  0 Response to Treatment: Procedure was tolerated well Post Debridement Measurements of Total Wound Length: (cm) 8 Width: (cm) 0.3 Depth: (cm) 0.2 Volume: (cm) 0.377 Character of Wound/Ulcer Post Requires Further Debridement Debridement: Severity of Tissue Post Debridement: Fat layer exposed Post Procedure Diagnosis Same as Pre-procedure Electronic Signature(s) Signed: 08/28/2016 6:11:14 PM By: Linton Ham MD Signed: 08/29/2016 3:37:28 PM By: Hiram Gash ([id number]) Entered By: Linton Ham on 08/28/2016 17:36:29 Robers, Daniel V. ([id number]) -------------------------------------------------------------------------------- Debridement Details Patient Name: [name], [name] Date of Service: 08/28/2016 1:30 PM Medical Record Patient Account Number: [id number] Number: Treating RN: Ahmed Prima September 06, 1940 (75 y.o. Other Clinician: Date of Birth/Sex: Female) Treating Daaiyah Baumert, [place] Primary Care Provider: Beverlyn Roux Provider/Extender: G Referring Provider: Dorthula Nettles in Treatment: 2 Debridement Performed for Wound #2 Right,Distal,Medial Lower Leg Assessment: Performed By: Physician Ricard Dillon, MD Debridement: Debridement Pre-procedure Yes - 14:38 Verification/Time Out Taken: Start Time: 14:41 Pain Control: Lidocaine 4% Topical Solution Level: Skin/Subcutaneous Tissue Total Area Debrided (L x 3.3 (cm) x 5.8 (cm) = 19.14 (cm) W): Tissue and other Viable, Non-Viable, Exudate, Fibrin/Slough, Subcutaneous material debrided: Instrument: Curette Bleeding: Minimum Hemostasis Achieved: Pressure End Time: 14:44 Procedural Pain: 0 Post Procedural Pain: 0 Response to Treatment: Procedure was tolerated well Post Debridement Measurements of Total Wound Length: (cm) 3.3 Width: (cm) 5.8 Depth: (cm) 0.1 Volume: (cm) 1.503 Character of Wound/Ulcer Post Requires Further Debridement Debridement: Severity of Tissue Post  Debridement: Fat layer exposed Post Procedure Diagnosis Same as Pre-procedure Electronic Signature(s) Signed: 08/28/2016 6:11:14 PM By: Linton Ham MD Signed: 08/29/2016 3:37:28 PM By: Hiram Gash ([id number]) Entered By: Linton Ham on 08/28/2016 17:36:40 Marcos, Sandie V. ([id number]) -------------------------------------------------------------------------------- HPI Details Patient Name: [name], [name] Date of Service: 08/28/2016 1:30 PM Medical Record Patient Account Number: [id number] Number: Treating RN: Ahmed Prima 1941-02-06 (75 y.o. Other Clinician: Date of Birth/Sex: Female) Treating Shantana Christon Primary Care Provider: Beverlyn Roux Provider/Extender: G Referring Provider: Dorthula Nettles in Treatment:  2 History of Present Illness HPI Description: 08/13/16: This is a 76 year old woman who came predominantly for review of 3 cm in diameter circular wound to the left anterior lateral leg. She was in the ER on 08/01/16 I reviewed their notes. There was apparently pus coming out of the wound at that time and the patient arrived requesting debridement which they don't do in the emergency room. Nevertheless I can't see that they did any x-rays. There were no cultures done. She is a type II diabetic and I a note after the patient was in the clinic that she had a bypass graft from the popliteal to the tibial on the right on 02/28/16. She also had a right greater saphenous vein harvest on the same date for arterial bypass. She is going to have vascular studies including ABIs T ABIs on the right on 08/28/16. The patient's surgery was on 02/28/16 by Dr. Vallarie Mare she had a right below the knee popliteal artery to peroneal artery bypass with reverse greater saphenous vein and an endarterectomy of the mid segment peroneal artery. Postoperatively she had a strong mild monophasic peroneal signal with a pink foot. It would appear that the patient is had some  nonhealing in the surgical saphenous vein harvest site on the left leg. [facility] I cannot see much information about this at all. Dr. Lucious Groves notes from 05/29/16 show that the patient's wounds "are not healed" the right first metatarsal wound healed but then opened back up. 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 At that point the patient had a wound VAC to her right calf with regards to the wounds on her dorsal right toe would appear that these are felt to be arterial wounds. She has had surgery on the metatarsal phalangeal in 2015 I Dr. Doran Durand secondary to a right metatarsal phalangeal joint fracture. She is apparently had discoloration around this area since then. 08/28/16; patient arrives with her wounds in much the same condition. The linear vein harvest site and the circular wound below it which I think was a blister. She also has to probing holes in her right great toe and a necrotic eschar on the right second toe. Because of these being arterial wounds I reduced her compression from 3-2 layers this seems to of done satisfactorily she has not had any problems. I cannot see that she is actually had an x-ray Electronic Signature(s) Signed: 08/28/2016 6:11:14 PM By: Linton Ham MD Entered By: Linton Ham on 08/28/2016 17:39:51 Collet, Haja [name] ([id number]) -------------------------------------------------------------------------------- Physical Exam Details Patient Name: Skellenger, Janea V. Date of Service: 08/28/2016 1:30 PM Medical Record Patient Account Number: [id number] Number: Treating RN: Ahmed Prima May 08, 1941 (75 y.o. Other Clinician: Date of Birth/Sex: Female) Treating Francis Yardley Primary Care Provider: Beverlyn Roux Provider/Extender: G Referring Provider: Dorthula Nettles in Treatment: 2 Constitutional Patient is  hypertensive.. Pulse regular and within target range for patient.[name] Respirations regular, non-labored and within target range.. Temperature is normal and within the target range for the patient.. Patient's appearance is neat and clean. Appears in no acute distress. Well nourished and well developed.. Notes Wound exam; the patient has 2 open areas in the surgical scar that was of vein harvest site on her right anterior leg. These are narrow linear areas covered in surface slough once again debrided by another with a #3 curet. Below this she had a blister site which is also  covered with a difficult surface slough. Using a #3 curet I tried to remove his much of this as I could. The patient has 2 small areas in the dorsal aspect of the right great toe. No debridement was required here. Electronic Signature(s) Signed: 08/28/2016 6:11:14 PM By: Linton Ham MD Entered By: Linton Ham on 08/28/2016 17:41:11 Neto, Lilyona [name] ([id number]) -------------------------------------------------------------------------------- Physician Orders Details Patient Name: Geisinger, Jonessa V. Date of Service: 08/28/2016 1:30 PM Medical Record Patient Account Number: [id number] Number: Treating RN: Ahmed Prima Jan 24, 1941 (75 y.o. Other Clinician: Date of Birth/Sex: Female) Treating Lavontay Kirk Primary Care Provider: Beverlyn Roux Provider/Extender: G Referring Provider: Dorthula Nettles in Treatment: 2 Verbal / Phone Orders: Yes ClinicianCarolyne Fiscal, Debi Read Back and Verified: Yes Diagnosis Coding Wound Cleansing Wound #1 Right Lower Leg o Clean wound with Normal Saline. o Cleanse wound with mild soap and water Wound #2 Right,Distal,Medial Lower Leg o Clean wound with Normal Saline. o Cleanse wound with mild soap and water Wound #3 Right,Dorsal Toe Great o Clean wound with Normal Saline. o Cleanse wound with mild soap and water Wound #4 Right,Plantar Toe Second o Clean  wound with Normal Saline. o Cleanse wound with mild soap and water Anesthetic Wound #1 Right Lower Leg o Topical Lidocaine 4% cream applied to wound bed prior to debridement - for clinic use Wound #2 Right,Distal,Medial Lower Leg o Topical Lidocaine 4% cream applied to wound bed prior to debridement - for clinic use Wound #3 Right,Dorsal Toe Great o Topical Lidocaine 4% cream applied to wound bed prior to debridement - for clinic use Wound #4 Right,Plantar Toe Second o Topical Lidocaine 4% cream applied to wound bed prior to debridement - for clinic use Primary Wound Dressing Wound #1 Right Lower Leg o Prisma Ag - moisten with saline Wound #2 Right,Distal,Medial Lower Leg Caughlin, Graceann V. ([id number]) o Prisma Ag - moisten with saline Wound #3 Right,Dorsal Toe Great o Prisma Ag - moisten with saline Secondary Dressing Wound #1 Right Lower Leg o ABD pad Wound #2 Right,Distal,Medial Lower Leg o ABD pad Wound #3 Right,Dorsal Toe Great o ABD pad Wound #4 Right,Plantar Toe Second o Foam - foam for the second plantar toe and to pad the right heel under wrap o Other - band-aide Dressing Change Frequency Wound #1 Right Lower Leg o Three times weekly - HHRN to change on Mondays and Fridays and pt will come to wound care clinic on Wednesdays. o Other: - Pt needs to be seen tomorrow Wednesday 08/14/16 as earlier as possible. Wound #2 Right,Distal,Medial Lower Leg o Three times weekly - HHRN to change on Mondays and Fridays and pt will come to wound care clinic on Wednesdays. o Other: - Pt needs to be seen tomorrow Wednesday 08/14/16 as earlier as possible. Wound #3 Right,Dorsal Toe Great o Three times weekly - HHRN to change on Mondays and Fridays and pt will come to wound care clinic on Wednesdays. o Other: - Pt needs to be seen tomorrow Wednesday 08/14/16 as earlier as possible. Wound #4 Right,Plantar Toe Second o Three times weekly - HHRN to  change on Mondays and Fridays and pt will come to wound care clinic on Wednesdays. o Other: - Pt needs to be seen tomorrow Wednesday 08/14/16 as earlier as possible. Follow-up Appointments Wound #1 Right Lower Leg o Return Appointment in 1 week. Wound #2 Right,Distal,Medial Lower Leg o Return Appointment in 1 week. Wound #3 [address] Lauranne V. ([id number]) o Return Appointment in 1  week. Edema Control Wound #1 Right Lower Leg o Kerlix and Coban - Right Lower Extremity - Please remove the 3-layer wrap and apply the kerlix / coban wrap lightly to right leg. Wound #2 Right,Distal,Medial Lower Leg o Kerlix and Coban - Right Lower Extremity - Please remove the 3-layer wrap and apply the kerlix / coban wrap lightly to right leg. Wound #3 Right,Dorsal Toe Great o Kerlix and Coban - Right Lower Extremity - Please remove the 3-layer wrap and apply the kerlix / coban wrap lightly to right leg. Wound #4 Right,Plantar Toe Second o Kerlix and Coban - Right Lower Extremity - Please remove the 3-layer wrap and apply the kerlix / coban wrap lightly to right leg. Additional Orders / Instructions Wound #1 Right Lower Leg o Increase protein intake. Wound #2 Right,Distal,Medial Lower Leg o Increase protein intake. Wound #3 Right,Dorsal Toe Great o Increase protein intake. Wound #4 Right,Plantar Toe Second o Increase protein intake. Home Health Wound #1 Right Lower Leg o [place] [facility] o Home Health Nurse may visit PRN to address patientos wound care needs. o FACE TO FACE ENCOUNTER: MEDICARE and MEDICAID PATIENTS: I certify that this patient is under my care and that I had a face-to-face encounter that meets the physician face-to-face encounter requirements with this patient on this date. The encounter with the patient was in whole or in part for the following MEDICAL CONDITION: (primary reason for [place]) MEDICAL  NECESSITY: I certify, that based on my findings, NURSING services are a medically necessary home health service. HOME BOUND STATUS: I certify that my clinical findings support that this patient is homebound (i.e., Due to illness or injury, pt requires aid of supportive devices such as crutches, cane, wheelchairs, walkers, the use of special transportation or the assistance of another person to leave their place of residence. There is a normal inability to leave the home and doing so requires considerable and taxing effort. Other absences are for medical reasons / religious services and are infrequent or of short duration when for other reasons). [name], Ragen V. ([id number]) o If current dressing causes regression in wound condition, may D/C ordered dressing product/s and apply Normal Saline Moist Dressing daily until next [place] / Other MD appointment. [place] of regression in wound condition at [phone]. o Please direct any NON-WOUND related issues/requests for orders to patient's Primary Care Physician Wound #2 Right,Distal,Medial Lower Leg o [place] Nurse may visit PRN to address patientos wound care needs. o FACE TO FACE ENCOUNTER: MEDICARE and MEDICAID PATIENTS: I certify that this patient is under my care and that I had a face-to-face encounter that meets the physician face-to-face encounter requirements with this patient on this date. The encounter with the patient was in whole or in part for the following MEDICAL CONDITION: (primary reason for [place]) MEDICAL NECESSITY: I certify, that based on my findings, NURSING services are a medically necessary home health service. HOME BOUND STATUS: I certify that my clinical findings support that this patient is homebound (i.e., Due to illness or injury, pt requires aid of supportive devices such as crutches, cane, wheelchairs, walkers, the use  of special transportation or the assistance of another person to leave their place of residence. There is a normal inability to leave the home and doing so requires considerable and taxing effort. Other absences are for medical reasons / religious services and are infrequent or of short duration  when for other reasons). o If current dressing causes regression in wound condition, may D/C ordered dressing product/s and apply Normal Saline Moist Dressing daily until next [place] / Other MD appointment. [place] of regression in wound condition at [phone]. o Please direct any NON-WOUND related issues/requests for orders to patient's Primary Care Physician Wound #3 Right,Dorsal Toe [place] Nurse may visit PRN to address patientos wound care needs. o FACE TO FACE ENCOUNTER: MEDICARE and MEDICAID PATIENTS: I certify that this patient is under my care and that I had a face-to-face encounter that meets the physician face-to-face encounter requirements with this patient on this date. The encounter with the patient was in whole or in part for the following MEDICAL CONDITION: (primary reason for [place]) MEDICAL NECESSITY: I certify, that based on my findings, NURSING services are a medically necessary home health service. HOME BOUND STATUS: I certify that my clinical findings support that this patient is homebound (i.e., Due to illness or injury, pt requires aid of supportive devices such as crutches, cane, wheelchairs, walkers, the use of special transportation or the assistance of another person to leave their place of residence. There is a normal inability to leave the home and doing so requires considerable and taxing effort. Other absences are for medical reasons / religious services and are infrequent or of short duration when for other reasons). o If current dressing causes regression  in wound condition, may D/C ordered dressing product/s and apply Normal Saline Moist Dressing daily until next [place] / Other MD appointment. [place] of regression in wound condition at [phone]. o Please direct any NON-WOUND related issues/requests for orders to patient's Primary Care Physician Wound #4 [facility] [facility] MONNA, SHIELS ([id number]) o [place] Nurse may visit PRN to address patientos wound care needs. o FACE TO FACE ENCOUNTER: MEDICARE and MEDICAID PATIENTS: I certify that this patient is under my care and that I had a face-to-face encounter that meets the physician face-to-face encounter requirements with this patient on this date. The encounter with the patient was in whole or in part for the following MEDICAL CONDITION: (primary reason for [place]) MEDICAL NECESSITY: I certify, that based on my findings, NURSING services are a medically necessary home health service. HOME BOUND STATUS: I certify that my clinical findings support that this patient is homebound (i.e., Due to illness or injury, pt requires aid of supportive devices such as crutches, cane, wheelchairs, walkers, the use of special transportation or the assistance of another person to leave their place of residence. There is a normal inability to leave the home and doing so requires considerable and taxing effort. Other absences are for medical reasons / religious services and are infrequent or of short duration when for other reasons). o If current dressing causes regression in wound condition, may D/C ordered dressing product/s and apply Normal Saline Moist Dressing daily until next [place] / Other MD appointment. [place] of regression in wound condition at [phone]. o Please direct any NON-WOUND related issues/requests for orders to patient's Primary  Care Physician Electronic Signature(s) Signed: 08/28/2016 6:11:14 PM By: Linton Ham MD Signed: 08/29/2016 3:37:28 PM By: Alric Quan Entered By: Alric Quan on 08/28/2016 14:43:55 Conard, Cierah V. ([id number]) -------------------------------------------------------------------------------- Problem List Details Patient Name: Sapia, Shantia V. Date of Service: 08/28/2016 1:30 PM Medical Record Patient Account  Number: [id number] Number: Treating RN: Ahmed Prima 08-07-1940 (75 y.o. 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 ([id number]) -------------------------------------------------------------------------------- Progress Note Details Patient Name: Milian, Lillymae V. Date of Service: 08/28/2016 1:30 PM Medical Record Patient Account Number: [id number] Number: Treating RN: Ahmed Prima March 26, 1941 (75 y.o. Other Clinician: Date of Birth/Sex: Female) Treating Ivelise Castillo Primary Care Provider: Beverlyn Roux Provider/Extender: G Referring Provider: Dorthula Nettles in Treatment: 2 Subjective Chief Complaint Information  obtained from Patient 08/13/16; patient is here for review of predominantly a wound on her right anterior lateral lower calf whenever our intake reveals a more superior right calf wound and 2 small open areas over the right great toe History of Present Illness (HPI) 08/13/16: This is a 76 year old woman who came predominantly for review of 3 cm in diameter circular wound to the left anterior lateral leg. She was in the ER on 08/01/16 I reviewed their notes. There was apparently pus coming out of the wound at that time and the patient arrived requesting debridement which they don't do in the emergency room. Nevertheless I can't see that they did any x-rays. There were no cultures done. She is a type II diabetic and I a note after the patient was in the clinic that she had a bypass graft from the popliteal to the tibial on the right on 02/28/16. She also had a right greater saphenous vein harvest on the same date for arterial bypass. She is going to have vascular studies including ABIs T ABIs on the right on 08/28/16. The patient's surgery was on 02/28/16 by Dr. Vallarie Mare she had a right below the knee popliteal artery to peroneal artery bypass with reverse greater saphenous vein and an endarterectomy of the mid segment peroneal artery. Postoperatively she had a strong mild monophasic peroneal signal with a pink foot. It would appear that the patient is had some nonhealing in the surgical saphenous vein harvest site on the left leg. Surprisingly [facility] I cannot see much information about this at all. Dr. Lucious Groves notes from 05/29/16 show that the patient's wounds "are not healed" the right first metatarsal wound healed but then opened back up. 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 She has had surgery on the metatarsal phalangeal in 2015 I Dr. Doran Durand secondary to a right metatarsal phalangeal joint fracture. She is apparently had discoloration around this area since then. 08/28/16; patient arrives with her wounds in much the same condition. The linear vein harvest site and the circular wound below it which I think was  a blister. She also has to probing holes in her right great toe and a necrotic eschar on the right second toe. Because of these being arterial wounds I reduced her compression from 3-2 layers this seems to of done satisfactorily she has not had any problems. I cannot see that she is actually had an x-ray [place], Nylene V. ([id number]) Objective Constitutional Patient is hypertensive.. Pulse regular and within target range for patient.[name] Respirations regular, non-labored and within target range.. Temperature is normal and within the target range for the patient.. Patient's appearance is neat and clean. Appears in no acute distress. Well nourished and well developed.. Vitals Time Taken: 2:11 PM, Height: 64 in, Weight: 216 lbs, BMI: 37.1, Temperature: 97.7 F, Pulse: 68 bpm, Respiratory Rate: 16 breaths/min, Blood Pressure: 147/57 mmHg. General Notes: Wound exam; the patient has 2 open areas in the surgical scar that was of vein harvest site on her right anterior leg. These are narrow linear areas covered in surface slough once again debrided by another with a #3 curet. Below this she had a blister site which is also covered with a difficult surface slough. Using a #3 curet I tried to remove his much of this as I could. The patient has 2 small areas in the dorsal aspect of the right great toe. No debridement was required here. Integumentary (Hair, Skin) Wound #1 status is Open. Original cause of wound was Surgical Injury. The wound is located on the Right Lower Leg. The wound measures 8cm length x  0.3cm width x 0.2cm depth; 1.885cm^2 area and 0.377cm^3 volume. The wound is limited to skin breakdown. There is no tunneling or undermining noted. There is a large amount of purulent drainage noted. The wound margin is flat and intact. There is small (1-33%) pink granulation within the wound bed. There is a large (67-100%) amount of necrotic tissue within the wound bed including Adherent Slough. The periwound skin appearance exhibited: Scarring, Ecchymosis. The periwound skin appearance did not exhibit: Callus, Crepitus, Excoriation, Induration, Rash, Dry/Scaly, Maceration, Atrophie Blanche, Cyanosis, Hemosiderin Staining, Mottled, Pallor, Rubor, Erythema. Wound #2 status is Open. Original cause of wound was Blister. The wound is located on the Right,Distal,Medial Lower Leg. The wound measures 3.3cm length x 5.8cm width x 0.1cm depth; 15.033cm^2 area and 1.503cm^3 volume. There is Fat Layer (Subcutaneous Tissue) Exposed exposed. There is no tunneling or undermining noted. There is a medium amount of serous drainage noted. The wound margin is flat and intact. There is medium (34-66%) red granulation within the wound bed. There is a medium (34-66%) amount of necrotic tissue within the wound bed including Adherent Slough. The periwound skin appearance exhibited: Excoriation. The periwound skin appearance did not exhibit: Callus, Crepitus, Induration, Rash, Scarring, Dry/Scaly, Maceration, Atrophie Blanche, Cyanosis, Ecchymosis, Hemosiderin Staining, Mottled, Pallor, Rubor, Erythema. Wound #3 status is Open. Original cause of wound was Surgical Injury. The wound is located on the Right,Dorsal [employer] The wound measures 0.4cm length x 0.3cm width x 0.5cm depth; 0.094cm^2 area and 0.047cm^3 volume. There is bone and muscle exposed. There is no undermining noted, however, there is tunneling at 12:00 with a maximum distance of 0.4cm. There is a large amount of purulent drainage noted. The wound  margin is indistinct and nonvisible. There is small (1-33%) red granulation within the wound bed. There is a medium (34-66%) amount of necrotic tissue within the wound bed including Eschar and Adherent Slough. The periwound skin appearance did not exhibit: Callus, Crepitus, Excoriation, Induration, Rash, Scarring, Dry/Scaly, Maceration, Atrophie  Blanche, Cyanosis, Ecchymosis, Hemosiderin Fenderson, Jariyah V. ([id number]) Staining, Mottled, Pallor, Rubor, Erythema. Wound #4 status is Open. Original cause of wound was Trauma. The wound is located on the Right,Plantar Toe Second. The wound measures 0.1cm length x 0.1cm width x 0.1cm depth; 0.008cm^2 area and 0.001cm^3 volume. The wound is limited to skin breakdown. There is no tunneling or undermining noted. There is a none present amount of drainage noted. The wound margin is indistinct and nonvisible. There is no granulation within the wound bed. There is no necrotic tissue within the wound bed. The periwound skin appearance did not exhibit: Callus, Crepitus, Excoriation, Induration, Rash, Scarring, Dry/Scaly, Maceration, Atrophie Blanche, Cyanosis, Ecchymosis, Hemosiderin Staining, Mottled, Pallor, Rubor, Erythema. 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 There was a Skin/Subcutaneous Tissue Debridement [id number]) debridement with total area of 2.4 sq cm performed by Ricard Dillon, MD. with the following instrument(s): Curette to remove Viable and  Non-Viable tissue/material including Exudate, Fibrin/Slough, and Subcutaneous after achieving pain control using Lidocaine 4% Topical Solution. A time out was conducted at 14:38, prior to the start of the procedure. A Minimum amount of bleeding was controlled with Pressure. The procedure was tolerated well with a pain level of 0 throughout and a pain level of 0 following the procedure. Post Debridement Measurements: 8cm length x 0.3cm width x 0.2cm depth; 0.377cm^3 volume. Character of Wound/Ulcer Post Debridement requires further debridement. Severity of Tissue Post Debridement is: Fat layer exposed. Post procedure Diagnosis Wound #1: Same as Pre-Procedure Wound #2 Wound #2 is a Diabetic Wound/Ulcer of the Lower Extremity located on the Right,Distal,Medial Lower Leg . There was a Skin/Subcutaneous Tissue Debridement [id number]) debridement with total area of 19.14 sq cm performed by Ricard Dillon, MD. with the following instrument(s): Curette to remove Viable and Non-Viable tissue/material including Exudate, Fibrin/Slough, and Subcutaneous after achieving pain Shehan, Lariza V. ([id number]) control using Lidocaine 4% Topical Solution. A time out was conducted at 14:38, prior to the start of the procedure. A Minimum amount of bleeding was controlled with Pressure. The procedure was tolerated well with a pain level of 0 throughout and a pain level of 0 following the procedure. Post Debridement Measurements: 3.3cm length x 5.8cm width x 0.1cm depth; 1.503cm^3 volume. Character of Wound/Ulcer Post Debridement requires further debridement. Severity of Tissue Post Debridement is: Fat layer exposed. Post procedure Diagnosis Wound #2: Same as Pre-Procedure Plan Wound Cleansing: Wound #1 Right Lower Leg: Clean wound with Normal Saline. Cleanse wound with mild soap and water Wound #2 Right,Distal,Medial Lower Leg: Clean wound with Normal Saline. Cleanse wound with mild soap and water Wound #3  Right,Dorsal Toe Great: Clean wound with Normal Saline. Cleanse wound with mild soap and water Wound #4 Right,Plantar Toe Second: Clean wound with Normal Saline. Cleanse wound with mild soap and water Anesthetic: Wound #1 Right Lower Leg: Topical Lidocaine 4% cream applied to wound bed prior to debridement - for clinic use Wound #2 Right,Distal,Medial Lower Leg: Topical Lidocaine 4% cream applied to wound bed prior to debridement - for clinic use Wound #3  Right,Dorsal Toe Great: Topical Lidocaine 4% cream applied to wound bed prior to debridement - for clinic use Wound #4 Right,Plantar Toe Second: Topical Lidocaine 4% cream applied to wound bed prior to debridement - for clinic use Primary Wound Dressing: Wound #1 Right Lower Leg: Prisma Ag - moisten with saline Wound #2 Right,Distal,Medial Lower Leg: Prisma Ag - moisten with saline Wound #3 Right,Dorsal Toe Great: Prisma Ag - moisten with saline Secondary Dressing: Wound #1 Right Lower Leg: ABD pad Wound #2 Right,Distal,Medial Lower Leg: ABD pad Wound #3 Right,Dorsal Toe Great: Shouse, Tamala V. ([id number]) ABD pad Wound #4 Right,Plantar Toe Second: Foam - foam for the second plantar toe and to pad the right heel under wrap Other - band-aide Dressing Change Frequency: Wound #1 Right Lower Leg: Three times weekly - HHRN to change on Mondays and Fridays and pt will come to wound care clinic on Wednesdays. Other: - Pt needs to be seen tomorrow Wednesday 08/14/16 as earlier as possible. Wound #2 Right,Distal,Medial Lower Leg: Three times weekly - HHRN to change on Mondays and Fridays and pt will come to wound care clinic on Wednesdays. Other: - Pt needs to be seen tomorrow Wednesday 08/14/16 as earlier as possible. Wound #3 Right,Dorsal Toe Great: Three times weekly - HHRN to change on Mondays and Fridays and pt will come to wound care clinic on Wednesdays. Other: - Pt needs to be seen tomorrow Wednesday 08/14/16 as earlier as  possible. Wound #4 Right,Plantar Toe Second: Three times weekly - HHRN to change on Mondays and Fridays and pt will come to wound care clinic on Wednesdays. Other: - Pt needs to be seen tomorrow Wednesday 08/14/16 as earlier as possible. Follow-up Appointments: Wound #1 Right Lower Leg: Return Appointment in 1 week. Wound #2 Right,Distal,Medial Lower Leg: Return Appointment in 1 week. Wound #3 Right,Dorsal Toe Great: Return Appointment in 1 week. Edema Control: Wound #1 Right Lower Leg: Kerlix and Coban - Right Lower Extremity - Please remove the 3-layer wrap and apply the kerlix / coban wrap lightly to right leg. Wound #2 Right,Distal,Medial Lower Leg: Kerlix and Coban - Right Lower Extremity - Please remove the 3-layer wrap and apply the kerlix / coban wrap lightly to right leg. Wound #3 Right,Dorsal Toe Great: Kerlix and Coban - Right Lower Extremity - Please remove the 3-layer wrap and apply the kerlix / coban wrap lightly to right leg. Wound #4 Right,Plantar Toe Second: Kerlix and Coban - Right Lower Extremity - Please remove the 3-layer wrap and apply the kerlix / coban wrap lightly to right leg. Additional Orders / Instructions: Wound #1 Right Lower Leg: Increase protein intake. Wound #2 Right,Distal,Medial Lower Leg: Increase protein intake. Wound #3 Right,Dorsal Toe Great: Increase protein intake. Wound #4 Right,Plantar Toe Second: Increase protein intake. Home Health: CORENNA, FORNSHELL ([id number]) Wound #1 Right Lower Leg: [place] Nurse may visit PRN to address patient s wound care needs. FACE TO FACE ENCOUNTER: MEDICARE and MEDICAID PATIENTS: I certify that this patient is under my care and that I had a face-to-face encounter that meets the physician face-to-face encounter requirements with this patient on this date. The encounter with the patient was in whole or in part for the following MEDICAL CONDITION: (primary reason  for [place]) MEDICAL NECESSITY: I certify, that based on my findings, NURSING services are a medically necessary home health service. HOME BOUND STATUS: I certify that my clinical findings support that this patient is homebound (i.e., Due to  illness or injury, pt requires aid of supportive devices such as crutches, cane, wheelchairs, walkers, the use of special transportation or the assistance of another person to leave their place of residence. There is a normal inability to leave the home and doing so requires considerable and taxing effort. Other absences are for medical reasons / religious services and are infrequent or of short duration when for other reasons). If current dressing causes regression in wound condition, may D/C ordered dressing product/s and apply Normal Saline Moist Dressing daily until next [place] / Other MD appointment. [place] of regression in wound condition at [phone]. Please direct any NON-WOUND related issues/requests for orders to patient's Primary Care Physician Wound #2 Right,Distal,Medial Lower Leg: [place] Nurse may visit PRN to address patient s wound care needs. FACE TO FACE ENCOUNTER: MEDICARE and MEDICAID PATIENTS: I certify that this patient is under my care and that I had a face-to-face encounter that meets the physician face-to-face encounter requirements with this patient on this date. The encounter with the patient was in whole or in part for the following MEDICAL CONDITION: (primary reason for [place]) MEDICAL NECESSITY: I certify, that based on my findings, NURSING services are a medically necessary home health service. HOME BOUND STATUS: I certify that my clinical findings support that this patient is homebound (i.e., Due to illness or injury, pt requires aid of supportive devices such as crutches, cane, wheelchairs, walkers, the use of special  transportation or the assistance of another person to leave their place of residence. There is a normal inability to leave the home and doing so requires considerable and taxing effort. Other absences are for medical reasons / religious services and are infrequent or of short duration when for other reasons). If current dressing causes regression in wound condition, may D/C ordered dressing product/s and apply Normal Saline Moist Dressing daily until next [place] / Other MD appointment. [place] of regression in wound condition at [phone]. Please direct any NON-WOUND related issues/requests for orders to patient's Primary Care Physician Wound #3 Right,Dorsal Toe Great: [place] Nurse may visit PRN to address patient s wound care needs. FACE TO FACE ENCOUNTER: MEDICARE and MEDICAID PATIENTS: I certify that this patient is under my care and that I had a face-to-face encounter that meets the physician face-to-face encounter requirements with this patient on this date. The encounter with the patient was in whole or in part for the following MEDICAL CONDITION: (primary reason for [place]) MEDICAL NECESSITY: I certify, that based on my findings, NURSING services are a medically necessary home health service. HOME BOUND STATUS: I certify that my clinical findings support that this patient is homebound (i.e., Due to illness or injury, pt requires aid of supportive devices such as crutches, cane, wheelchairs, walkers, the use of special transportation or the assistance of another person to leave their place of residence. There is a normal inability to leave the home and doing so requires considerable and taxing effort. Other absences are for medical reasons / religious services and are infrequent or of short duration when for other reasons). If current dressing causes regression in wound condition, may D/C ordered  dressing product/s and apply Normal Saline Moist Dressing daily until next [place] / Other MD appointment. [place] of regression in wound condition at [phone]. Please direct any NON-WOUND related issues/requests for orders to  patient's Primary Care Physician VALITA, MCCRAY ([id number]) Wound #4 Right,Plantar Toe Second: [place] Nurse may visit PRN to address patient s wound care needs. FACE TO FACE ENCOUNTER: MEDICARE and MEDICAID PATIENTS: I certify that this patient is under my care and that I had a face-to-face encounter that meets the physician face-to-face encounter requirements with this patient on this date. The encounter with the patient was in whole or in part for the following MEDICAL CONDITION: (primary reason for [place]) MEDICAL NECESSITY: I certify, that based on my findings, NURSING services are a medically necessary home health service. HOME BOUND STATUS: I certify that my clinical findings support that this patient is homebound (i.e., Due to illness or injury, pt requires aid of supportive devices such as crutches, cane, wheelchairs, walkers, the use of special transportation or the assistance of another person to leave their place of residence. There is a normal inability to leave the home and doing so requires considerable and taxing effort. Other absences are for medical reasons / religious services and are infrequent or of short duration when for other reasons). If current dressing causes regression in wound condition, may D/C ordered dressing product/s and apply Normal Saline Moist Dressing daily until next [place] / Other MD appointment. [place] of regression in wound condition at [phone]. Please direct any NON-WOUND related issues/requests for orders to patient's Primary Care Physician continue with prisma to all wound areas She will need an  xray of her foot if this hasn't already been done Electronic Signature(s) Signed: 08/28/2016 6:11:14 PM By: Linton Ham MD Entered By: Linton Ham on 08/28/2016 17:43:05 Couper, Marionna V. ([id number]) -------------------------------------------------------------------------------- SuperBill Details Patient Name: Wyse, Xitlaly V. Date of Service: 08/28/2016 Medical Record Patient Account Number: [id number] Number: Treating RN: Ahmed Prima [birth year] (75 y.o. Other Clinician: Date of Birth/Sex: Female) Treating Willella Harding, [place] Primary Care Provider: Beverlyn Roux Provider/Extender: G Referring Provider: Dorthula Nettles in Treatment: 2 Diagnosis Coding ICD-10 Codes Code Description L97.211 Non-pressure chronic ulcer of right calf limited to breakdown of skin Disruption of external operation (surgical) wound, not elsewhere classified, initial T81.31XA encounter L97.528 Non-pressure chronic ulcer of other part of left foot with other specified severity E11.621 Type 2 diabetes mellitus with foot ulcer E11.622 Type 2 diabetes mellitus with other skin ulcer Facility Procedures CPT4 Code Description: [id number] - DEB SUBQ TISSUE 20 SQ CM/< ICD-10 Description Diagnosis L97.211 Non-pressure chronic ulcer of right calf limited to Modifier: breakdown of skin Quantity: 1 CPT4 Code Description: [id number] 11045 - DEB SUBQ TISS EA ADDL 20CM ICD-10 Description Diagnosis L97.211 Non-pressure chronic ulcer of right calf limited to Modifier: breakdown of skin Quantity: 1 Physician Procedures CPT4 Code Description: [id number] 11042 - WC PHYS SUBQ TISS 20 SQ CM ICD-10 Description Diagnosis L97.211 Non-pressure chronic ulcer of right calf limited to b Modifier: reakdown of skin Quantity: 1 CPT4 Code Description: [id number] - WC PHYS SUBQ TISS EA ADDL 20 CM ICD-10 Description Diagnosis L97.211 Non-pressure chronic ulcer of right calf limited to b Wenger, Eola V.  ([id number]) Modifier: reakdown of skin Quantity: 1 Electronic Signature(s) Signed: 08/28/2016 6:11:14 PM By: Linton Ham MD Entered By: Linton Ham on 08/28/2016 17:43:35

## 2016-08-30 NOTE — Progress Notes (Signed)
RT call to patients room stat due to SOB.  Patient was placed on BiPAP 10/5 via the [facility] I per MD order. Patient was administered a PRN Albuterol treatment  Patient is tolerating well at this time, RT will continue to monitor.

## 2016-08-30 NOTE — Progress Notes (Signed)
Pt is complaining of SOB, she received breathing treatment (albuterol neb) with no improvement. Her Sat are in the high 90's on room air but she was placed on O2 via nasal canula. RR in the 30' and HR in the 130-140's. The MD was paged and ordered to stop IV fluid and give 40 mg lasix IV and STAT chest X-ray. Pt is also very anxious and received 0.5 mg of Ativan.  [name] Lango, RN

## 2016-08-30 NOTE — Evaluation (Signed)
Clinical/Bedside Swallow Evaluation Patient Details  Name: [name] MRN: [id number] Date of Birth: [birth year]  Today's Date: 08/30/2016 Time: SLP Start Time (ACUTE ONLY): 0845 SLP Stop Time (ACUTE ONLY): 0905 SLP Time Calculation (min) (ACUTE ONLY): 20 min  Past Medical History:  Past Medical History:  Diagnosis Date  . Acute heart failure ([place])   . Aortic stenosis   . Complication of anesthesia    Hard to wake patient up after having anesthesia  . Diabetes mellitus without complication ([place])   . Diabetic neuropathy ([facility])    Takes Lyrica  . Edema of both legs    Takes Lasix  . GERD (gastroesophageal reflux disease)   . High cholesterol   . HTN (hypertension)   . Hypertension   . PAD (peripheral artery disease) ([place])   . Shortness of breath dyspnea   . Spasm of back muscles   . Stroke ([place])   . Wound, open    Right great toe   Past Surgical History:  Past Surgical History:  Procedure Laterality Date  . BYPASS GRAFT POPLITEAL TO TIBIAL Right 02/28/2016   Procedure: BYPASS GRAFT RIGHT BELOW KNEE POPLITEAL TO PERONEAL USING REVERSED RIGHT GREATER SAPHENOUS VEIN;  Surgeon: Conrad Navajo, MD;  Location: [place];  Service: Vascular;  Laterality: Right;  . CYST EXCISION     Abdomen  . EYE SURGERY Bilateral    Cataract removal  . INTRAMEDULLARY (IM) NAIL INTERTROCHANTERIC Left 02/04/2014   Procedure: INTRAMEDULLARY (IM) NAIL INTERTROCHANTRIC FEMORAL;  Surgeon: Mauri Pole, MD;  Location: [facility];  Service: Orthopedics;  Laterality: Left;  . IR GENERIC HISTORICAL  03/01/2016   IR ANGIO INTRA EXTRACRAN SEL COM CAROTID INNOMINATE UNI R MOD SED 03/01/2016 [name], MD [facility]  . IR GENERIC HISTORICAL  03/01/2016   IR ENDOVASC INTRACRANIAL INF OTHER THAN THROMBO ART INC DIAG ANGIO 03/01/2016 [name], MD [facility]  . IR GENERIC HISTORICAL  03/01/2016   IR INTRAVSC STENT CERV CAROTID W/O EMB-PROT MOD SED INC ANGIO 03/01/2016 [name], MD [facility]  .  IR GENERIC HISTORICAL  06/03/2016   IR RADIOLOGIST EVAL & MGMT 06/03/2016 [facility]  . ORIF TOE FRACTURE Right 02/08/2014   Procedure: OPEN REDUCTION INTERNAL FIXATION Right METATARSAL  FRACTURE ;  Surgeon: Wylene Simmer, MD;  Location: [facility];  Service: Orthopedics;  Laterality: Right;  . PERIPHERAL VASCULAR CATHETERIZATION N/A 12/28/2015   Procedure: Abdominal Aortogram w/Lower Extremity;  Surgeon: Conrad London, MD;  Location: [facility];  Service: Cardiovascular;  Laterality: N/A;  . RADIOLOGY WITH ANESTHESIA N/A 03/01/2016   Procedure: RADIOLOGY WITH ANESTHESIA;  Surgeon: [name], MD;  Location: [facility];  Service: Radiology;  Laterality: N/A;  . VEIN HARVEST Right 02/28/2016   Procedure: RIGHT GREATER SAPHENOUS VEIN HARVEST;  Surgeon: Conrad [place], MD;  Location: [facility];  Service: Vascular;  Laterality: Right;   HPI:  Pt is a 75 y.o. female with PMH of DM2 and HTN, presented to ED 1/25 with AMS. Head CT negative, CXR showing patchy R upper/ bibasilar areas of superimposed airspace disease concerning for minimal PNA. Pt was followed by speech therapy in August 2017 following acute stroke; had MBS on 8/8 with recommendations for dysphagia 1/ honey thick liquids by tsp. Bedside swallow eval ordered.   Assessment / Plan / Recommendation Clinical Impression  Pt had a delayed cough following about 50% of trials of thin liquid and puree consistencies. Pt reports "getting strangled" at home at times with both  food and liquids. Pt has a hx of dysphagia post-stroke in 2017 with recommendations for dysphagia 1/ honey-thick liquids; however, pt reported having regular food and liquids upon return home. The pt may have some residual swallowing deficits. Recommend proceeding with MBS to objectively evaluate swallow function. Until then recommend that pt remain NPO.     Aspiration Risk  Severe aspiration risk    Diet Recommendation NPO   Medication Administration: Via alternative means     Other  Recommendations Oral Care Recommendations: Oral care QID   Follow up Recommendations Other (comment) (TBD)      Frequency and Duration            Prognosis        Swallow Study   General HPI: Pt is a 76 y.o. female with PMH of DM2 and HTN, presented to ED 1/25 with AMS. Head CT negative, CXR showing patchy R upper/ bibasilar areas of superimposed airspace disease concerning for minimal PNA. Pt was followed by speech therapy in August 2017 following acute stroke; had MBS on 8/8 with recommendations for dysphagia 1/ honey thick liquids by tsp. Bedside swallow eval ordered. Type of Study: Bedside Swallow Evaluation Previous Swallow Assessment: MBS August 2017- rec'd D1/ honey thick by tsp Diet Prior to this Study: NPO Temperature Spikes Noted: Yes Respiratory Status: Room air History of Recent Intubation: No Behavior/Cognition: Alert;Cooperative Oral Cavity Assessment: Within Functional Limits Oral Cavity - Dentition: Adequate natural dentition Patient Positioning: Upright in bed Baseline Vocal Quality: Normal Volitional Cough: Strong Volitional Swallow: Able to elicit    Oral/Motor/Sensory Function Overall Oral Motor/Sensory Function: Mild impairment Facial ROM: Reduced right Facial Symmetry: Abnormal symmetry right Facial Strength: Within Functional Limits Facial Sensation: Within Functional Limits Lingual ROM: Within Functional Limits Lingual Symmetry: Within Functional Limits   Ice Chips Ice chips: Not tested   Thin Liquid Thin Liquid: Impaired Presentation: Straw Pharyngeal  Phase Impairments: Cough - Delayed    Nectar Thick Nectar Thick Liquid: Not tested   Honey Thick Honey Thick Liquid: Not tested   Puree Puree: Impaired Presentation: Spoon Pharyngeal Phase Impairments: Cough - Delayed   Solid   GO   Solid: Not tested (pt refused)        Kern Reap, MA, CCC-SLP 08/30/2016,9:14 AM [phone]

## 2016-08-30 NOTE — Significant Event (Addendum)
Rapid Response Event Note  Overview:Called by patients RN re: pt SOB and increased RR Time Called: 1933 Arrival Time: 1940 Event Type: Respiratory  Initial Focused Assessment: Pt alert, but lethargic, HR-130s ST, BP-184/72,  RR-40s, sp02-98% on 5L [place].  Pt with ins and exp wheezes t/o upper lobes, crackles in lower lobes.  Heart murmur present. Generalized non-pitting edema present.  2010 Pt less SOB, no audible wheezes. HR-127, BP-143/55, RR-24, Sats-99% on bipap .40 Pt incontient X 1    Interventions:  Alb tx and  40mg  lasix given, IVF stopped, and pt placed on 5L [place] PTA RRT.  Bipap order per RRT protocol. Pt pulled up in the bed. [place], [place] to bedside.  Orders to given another alb tx prior to bipap initiation.  Plan of Care (if not transferred): Bipap, alb prn, monitor RR and sats. Call RRT if needed.  Event Summary: Name of Physician Notified: Caryl Ada, PA at  (PTA RRT)    at    Outcome: Stayed in room and stabalized  Event End Time: 2015  Dillard [place]

## 2016-08-30 NOTE — Progress Notes (Signed)
Patient ID: [name], female   DOB: 13-Jul-1941, 76 y.o.   MRN: [id number] Rn reports pt has increasing shortness of breath. 02 dropped to 80's.  MD had ordered 40 of lasix and chest xray.   Pt given 2 albuterol nebs.  Bipap started.

## 2016-08-30 NOTE — Progress Notes (Signed)
Upon initial assessment, pt appeared to be very lethargic, increasing SOB, with labored breathing, tachycardia, and decreasing O2 sats requiring an increase in oxygen. Crackles and wheezing noted. Pt unable to talk. Pt already received a 1x dose of lasix. Rapid response alerted and [facility] on-call paged.  K. Threasa Alpha, [place] at bedside. Order received to start BiPAP. Albuterol tx administered. Pt tolerating BiPAP and is resting comfortably. Family at bedside and updated. Will continue to monitor and assess.  Jacqlyn Larsen, RN

## 2016-08-30 NOTE — Progress Notes (Signed)
PROGRESS NOTE  [name] [id number] DOB: 01-12-1941 DOA: 08/28/2016 PCP: [name], MD   LOS: 1 day   Brief Narrative: In brief, complex vascular patient with history of PAD, DM, HTN, prior CVA with residual right sided weakness admitted with sepsis, ?Pneumonia vs RLE wound  Assessment & Plan: Principal Problem:   Acute encephalopathy Active Problems:   DM2 (diabetes mellitus, type 2) (HCC)   Atherosclerosis of native arteries of the extremities with ulceration ([place])   Essential hypertension   Ulcer of right lower extremity (HCC)   Sepsis ([place])   Bilateral pneumonia   Sepsis due to Group B strep bacteremia - Blood cultures positive for group B strep, final results are pending, continue vancomycin and Levaquin for today. She is penicillin allergic.  HCAP - probable strep pneumonia, awaiting final blood cultures. Respiratory status stable, she is on room air.  Acute encephalopathy - slowly resolving, she is much more alert today, this is likely due to #1 and #2   Hypertension - resume Coreg, Norvasc   Hyperlipidemia - continue statin  Type 2 diabetes mellitus - continue Lantus and sliding scale  Right lower extremity ulcer - wound care consulted   Peripheral artery disease - Resume Plavix    DVT prophylaxis: Lovenox Code Status: Full code Family Communication: no family at bedside Disposition Plan: home when ready. PT eval pending  Consultants:   None   Procedures:   None   Antimicrobials:  Vancomycin 1/25 >>  Levaquin 1/25 >>   Subjective: - complains of hands and feet pain, has been having pain for years. Denies any chest pain or breathing difficulties.   Objective: Vitals:   08/30/16 0255 08/30/16 0430 08/30/16 0800 08/30/16 1139  BP:  (!) 139/51 (!) 166/60   Pulse:  97 94 96  Resp:  (!) 21 18 16   Temp: 100.2 F (37.9 C) 100 F (37.8 C) 99.1 F (37.3 C) 98.2 F (36.8 C)  TempSrc: Rectal Rectal Oral Oral  SpO2:  98% 99% 96%    Weight:  107.1 kg (236 lb 3.2 oz)    Height:        Intake/Output Summary (Last 24 hours) at 08/30/16 1248 Last data filed at 08/30/16 0431  Gross per 24 hour  Intake          3029.58 ml  Output                0 ml  Net          3029.58 ml   Filed Weights   08/29/16 0005 08/30/16 0430  Weight: 96.6 kg (213 lb) 107.1 kg (236 lb 3.2 oz)    Examination: Constitutional: NAD, much more alert today, still with mild confusion  Vitals:   08/30/16 0255 08/30/16 0430 08/30/16 0800 08/30/16 1139  BP:  (!) 139/51 (!) 166/60   Pulse:  97 94 96  Resp:  (!) 21 18 16   Temp: 100.2 F (37.9 C) 100 F (37.8 C) 99.1 F (37.3 C) 98.2 F (36.8 C)  TempSrc: Rectal Rectal Oral Oral  SpO2:  98% 99% 96%  Weight:  107.1 kg (236 lb 3.2 oz)    Height:       Eyes: PERRL, lids and conjunctivae normal Respiratory: clear to auscultation bilaterally, no wheezing, no crackles. Normal respiratory effort Cardiovascular: Regular rate and rhythm, no murmurs / rubs / gallops. No LE edema.  Abdomen: no tenderness. Bowel sounds positive.  Musculoskeletal: no clubbing / cyanosis. No joint deformity upper and  lower extremities.  Skin: no rashes. RLE anterior shin wounds, no drainage, no surrounding cellulitis. Neurologic: CN 2-12 grossly intact. Strength 5/5 in all 4.  Psychiatric: Normal judgment and insight. Alert and oriented x To person    Data Reviewed: I have personally reviewed following labs and imaging studies  CBC:  Recent Labs Lab 08/29/16 0120  WBC 13.8*  NEUTROABS 12.7*  HGB 11.3*  HCT 35.3*  MCV 81.1  PLT [id number]   Basic Metabolic Panel:  Recent Labs Lab 08/29/16 0120  NA 135  K 3.5  CL 99*  CO2 22  GLUCOSE 263*  BUN 21*  CREATININE 0.95  CALCIUM 9.1   GFR: Estimated Creatinine Clearance: 63 mL/min (by C-G formula based on SCr of 0.95 mg/dL). Liver Function Tests:  Recent Labs Lab 08/29/16 0120  AST 26  ALT 27  ALKPHOS 69  BILITOT 0.8  PROT 6.7  ALBUMIN 3.4*   No  results for input(s): LIPASE, AMYLASE in the last 168 hours.  Recent Labs Lab 08/29/16 0556  AMMONIA 34   Coagulation Profile: No results for input(s): INR, PROTIME in the last 168 hours. Cardiac Enzymes: No results for input(s): CKTOTAL, CKMB, CKMBINDEX, TROPONINI in the last 168 hours. BNP (last 3 results) No results for input(s): PROBNP in the last 8760 hours. HbA1C: No results for input(s): HGBA1C in the last 72 hours. CBG:  Recent Labs Lab 08/29/16 2159 08/30/16 0003 08/30/16 0436 08/30/16 0756 08/30/16 1129  GLUCAP 146* 148* 121* 160* 234*   Lipid Profile: No results for input(s): CHOL, HDL, LDLCALC, TRIG, CHOLHDL, LDLDIRECT in the last 72 hours. Thyroid Function Tests: No results for input(s): TSH, T4TOTAL, FREET4, T3FREE, THYROIDAB in the last 72 hours. Anemia Panel: No results for input(s): VITAMINB12, FOLATE, FERRITIN, TIBC, IRON, RETICCTPCT in the last 72 hours. Urine analysis:    Component Value Date/Time   COLORURINE YELLOW 08/29/2016 1815   APPEARANCEUR CLOUDY (A) 08/29/2016 1815   LABSPEC 1.015 08/29/2016 1815   PHURINE 5.0 08/29/2016 1815   GLUCOSEU NEGATIVE 08/29/2016 1815   HGBUR SMALL (A) 08/29/2016 1815   BILIRUBINUR NEGATIVE 08/29/2016 1815   KETONESUR NEGATIVE 08/29/2016 1815   PROTEINUR NEGATIVE 08/29/2016 1815   UROBILINOGEN 0.2 02/12/2014 0110   NITRITE NEGATIVE 08/29/2016 1815   LEUKOCYTESUR LARGE (A) 08/29/2016 1815   Sepsis Labs: Invalid input(s): PROCALCITONIN, LACTICIDVEN  Recent Results (from the past 240 hour(s))  Blood Culture (routine x 2)     Status: Abnormal (Preliminary result)   Collection Time: 08/29/16  1:27 AM  Result Value Ref Range Status   Specimen Description BLOOD LEFT ARM  Final   Special Requests BLOOD 5CC  Final   Culture  Setup Time   Final    GRAM POSITIVE COCCI IN CHAINS IN BOTH AEROBIC AND ANAEROBIC BOTTLES CRITICAL RESULT CALLED TO, READ BACK BY AND VERIFIED WITH: J. Arminger Pharm.D. 15:40 08/29/16   ([email or web address])    Culture (A)  Final    GROUP B STREP(S.AGALACTIAE)ISOLATED SUSCEPTIBILITIES TO FOLLOW    Report Status PENDING  Incomplete  Blood Culture ID Panel (Reflexed)     Status: Abnormal   Collection Time: 08/29/16  1:27 AM  Result Value Ref Range Status   Enterococcus species NOT DETECTED NOT DETECTED Final   Listeria monocytogenes NOT DETECTED NOT DETECTED Final   Staphylococcus species NOT DETECTED NOT DETECTED Final   Staphylococcus aureus NOT DETECTED NOT DETECTED Final   Streptococcus species DETECTED (A) NOT DETECTED Final    Comment: CRITICAL RESULT CALLED TO, READ BACK BY  AND VERIFIED WITH: J. Arminger Pharm.D. 15:40 08/29/16 ([email or web address])    Streptococcus agalactiae DETECTED (A) NOT DETECTED Final    Comment: CRITICAL RESULT CALLED TO, READ BACK BY AND VERIFIED WITH: J. Arminger Pharm.D. 15:40 08/29/16 ([email or web address])    Streptococcus pneumoniae NOT DETECTED NOT DETECTED Final   Streptococcus pyogenes NOT DETECTED NOT DETECTED Final   Acinetobacter baumannii NOT DETECTED NOT DETECTED Final   Enterobacteriaceae species NOT DETECTED NOT DETECTED Final   Enterobacter cloacae complex NOT DETECTED NOT DETECTED Final   Escherichia coli NOT DETECTED NOT DETECTED Final   Klebsiella oxytoca NOT DETECTED NOT DETECTED Final   Klebsiella pneumoniae NOT DETECTED NOT DETECTED Final   Proteus species NOT DETECTED NOT DETECTED Final   Serratia marcescens NOT DETECTED NOT DETECTED Final   Haemophilus influenzae NOT DETECTED NOT DETECTED Final   Neisseria meningitidis NOT DETECTED NOT DETECTED Final   Pseudomonas aeruginosa NOT DETECTED NOT DETECTED Final   Candida albicans NOT DETECTED NOT DETECTED Final   Candida glabrata NOT DETECTED NOT DETECTED Final   Candida krusei NOT DETECTED NOT DETECTED Final   Candida parapsilosis NOT DETECTED NOT DETECTED Final   Candida tropicalis NOT DETECTED NOT DETECTED Final  Blood Culture (routine x 2)     Status: None (Preliminary result)   Collection  Time: 08/29/16  3:18 AM  Result Value Ref Range Status   Specimen Description BLOOD LEFT ARM  Final   Special Requests IN PEDIATRIC BOTTLE 2ML  Final   Culture  Setup Time   Final    GRAM POSITIVE COCCI IN CHAINS IN PEDIATRIC BOTTLE CRITICAL VALUE NOTED.  VALUE IS CONSISTENT WITH PREVIOUSLY REPORTED AND CALLED VALUE.    Culture GRAM POSITIVE COCCI  Final   Report Status PENDING  Incomplete  MRSA PCR Screening     Status: None   Collection Time: 08/29/16  8:06 PM  Result Value Ref Range Status   MRSA by PCR NEGATIVE NEGATIVE Final    Comment:        The GeneXpert MRSA Assay (FDA approved for NASAL specimens only), is one component of a comprehensive MRSA colonization surveillance program. It is not intended to diagnose MRSA infection nor to guide or monitor treatment for MRSA infections.       Radiology Studies: Ct Head Wo Contrast  Result Date: 08/29/2016 CLINICAL DATA:  Initial evaluation for acute altered mental status. EXAM: CT HEAD WITHOUT CONTRAST TECHNIQUE: Contiguous axial images were obtained from the base of the skull through the vertex without intravenous contrast. COMPARISON:  Prior MRI from 07/23/2016. FINDINGS: Brain: Generalized age-related cerebral atrophy with chronic microvascular ischemic disease, stable. No acute intracranial hemorrhage. No evidence for acute large vessel territory infarct. Small calcified meningioma at the right posterior fossa noted without significant mass effect. No other mass lesion. No midline shift or mass effect. No hydrocephalus. No extra-axial fluid collection. Vascular: No hyperdense vessel. Scattered vascular calcifications noted within the carotid siphons. Skull: Scalp soft tissues demonstrate no acute abnormality. Calvarium intact. Sinuses/Orbits: Globes and orbits within normal limits. Patient is status post lens extraction bilaterally. Paranasal sinuses are clear. Trace left mastoid effusion. IMPRESSION: 1. No acute intracranial  process identified. 2. Small calcified meningioma within the right posterior fossa without significant mass effect, stable. 3. Mild atrophy with chronic small vessel ischemic disease. Electronically Signed   By: Jeannine Boga M.D.   On: 08/29/2016 05:30   Dg Chest Port 1 View  Result Date: 08/29/2016 CLINICAL DATA:  Fever EXAM: PORTABLE CHEST 1 VIEW COMPARISON:  07/05/2016  FINDINGS: Heart is enlarged. There is aortic atherosclerosis. There is atelectasis bilaterally. Minimal superimposed airspace opacities in the right upper and both lower lobes cannot exclude pneumonia. No acute osseous abnormality. IMPRESSION: Stable cardiomegaly. Atelectasis. Patchy right upper and bibasilar areas of superimposed airspace disease may reflect minimal pneumonia. Electronically Signed   By: Ashley Royalty M.D.   On: 08/29/2016 01:40   Dg Swallowing Func-speech Pathology  Result Date: 08/30/2016 Objective Swallowing Evaluation: Type of Study: MBS-Modified Barium Swallow Study Patient Details Name: POSY SAVKO MRN: [id number] Date of Birth: 23-Jan-1941 Today's Date: 08/30/2016 Time: SLP Start Time (ACUTE ONLY): 1055-SLP Stop Time (ACUTE ONLY): 1112 SLP Time Calculation (min) (ACUTE ONLY): 17 min Past Medical History: Past Medical History: Diagnosis Date . Acute heart failure ([place])  . Aortic stenosis  . Complication of anesthesia   Hard to wake patient up after having anesthesia . Diabetes mellitus without complication ([place])  . Diabetic neuropathy (HCC)   Takes Lyrica . Edema of both legs   Takes Lasix . GERD (gastroesophageal reflux disease)  . High cholesterol  . HTN (hypertension)  . Hypertension  . PAD (peripheral artery disease) ([place])  . Shortness of breath dyspnea  . Spasm of back muscles  . Stroke ([place])  . Wound, open   Right great toe Past Surgical History: Past Surgical History: Procedure Laterality Date . BYPASS GRAFT POPLITEAL TO TIBIAL Right 02/28/2016  Procedure: BYPASS GRAFT RIGHT BELOW KNEE POPLITEAL TO PERONEAL  USING REVERSED RIGHT GREATER SAPHENOUS VEIN;  Surgeon: Conrad [place], MD;  Location: [facility];  Service: Vascular;  Laterality: Right; . CYST EXCISION    Abdomen . EYE SURGERY Bilateral   Cataract removal . INTRAMEDULLARY (IM) NAIL INTERTROCHANTERIC Left 02/04/2014  Procedure: INTRAMEDULLARY (IM) NAIL INTERTROCHANTRIC FEMORAL;  Surgeon: Mauri Pole, MD;  Location: [place];  Service: Orthopedics;  Laterality: Left; . IR GENERIC HISTORICAL  03/01/2016  IR ANGIO INTRA EXTRACRAN SEL COM CAROTID INNOMINATE UNI R MOD SED 03/01/2016 [name], MD [facility] . IR GENERIC HISTORICAL  03/01/2016  IR ENDOVASC INTRACRANIAL INF OTHER THAN THROMBO ART INC DIAG ANGIO 03/01/2016 [name], MD [facility] . IR GENERIC HISTORICAL  03/01/2016  IR INTRAVSC STENT CERV CAROTID W/O EMB-PROT MOD SED INC ANGIO 03/01/2016 [name], MD [facility] . IR GENERIC HISTORICAL  06/03/2016  IR RADIOLOGIST EVAL & MGMT 06/03/2016 [facility] . ORIF TOE FRACTURE Right 02/08/2014  Procedure: OPEN REDUCTION INTERNAL FIXATION Right METATARSAL  FRACTURE ;  Surgeon: Wylene Simmer, MD;  Location: [facility];  Service: Orthopedics;  Laterality: Right; . PERIPHERAL VASCULAR CATHETERIZATION N/A 12/28/2015  Procedure: Abdominal Aortogram w/Lower Extremity;  Surgeon: Conrad Adwolf, MD;  Location: [facility];  Service: Cardiovascular;  Laterality: N/A; . RADIOLOGY WITH ANESTHESIA N/A 03/01/2016  Procedure: RADIOLOGY WITH ANESTHESIA;  Surgeon: [name], MD;  Location: [facility];  Service: Radiology;  Laterality: N/A; . VEIN HARVEST Right 02/28/2016  Procedure: RIGHT GREATER SAPHENOUS VEIN HARVEST;  Surgeon: Conrad [place], MD;  Location: [facility];  Service: Vascular;  Laterality: Right; HPI: Pt is a 76 y.o. female with PMH of DM2 and HTN, presented to ED 1/25 with AMS. Head CT negative, CXR showing patchy R upper/ bibasilar areas of superimposed airspace disease concerning for minimal PNA. Pt was followed by speech therapy in August 2017 following  acute stroke; had MBS on 8/8 with recommendations for dysphagia 1/ honey thick liquids by tsp. Bedside swallow eval ordered. No Data Recorded Assessment / Plan / Recommendation CHL IP CLINICAL  IMPRESSIONS 08/30/2016 Therapy Diagnosis WFL Clinical Impression Pt currently with oropharyngeal swallow function within functional limits. Pt does have premature spill to the level of the vallecula across consistencies but this did not result in any penetration or aspiration with numerous trials provided. Recommend initiating regular diet, thin liquids, meds whole with liquid, full supervision to assist with feeding during meals; also follow reflux precautions given hx of GERD. Worth noting that pt is concerned about having "hard foods" after not eating for a couple of days and may have preference for soft solids initially.SLP will sign off at this time; please re-consult if needs arise.  Impact on safety and function Mild aspiration risk   CHL IP TREATMENT RECOMMENDATION 08/30/2016 Treatment Recommendations No treatment recommended at this time           CHL IP DIET RECOMMENDATION 08/30/2016 SLP Diet Recommendations Regular solids;Thin liquid Liquid Administration via Cup;Straw Medication Administration Whole meds with liquid Compensations Slow rate;Small sips/bites Postural Changes Seated upright at 90 degrees;Remain semi-upright after after feeds/meals (Comment)   CHL IP OTHER RECOMMENDATIONS 08/30/2016 Recommended Consults -- Oral Care Recommendations Oral care BID Other Recommendations --   CHL IP FOLLOW UP RECOMMENDATIONS 08/30/2016 Follow up Recommendations None        CHL IP ORAL PHASE 08/30/2016 Oral Phase Impaired Oral - Pudding Teaspoon -- Oral - Pudding Cup -- Oral - Honey Teaspoon -- Oral - Honey Cup -- Oral - Nectar Teaspoon -- Oral - Nectar Cup -- Oral - Nectar Straw -- Oral - Thin Teaspoon -- Oral - Thin Cup Premature spillage Oral - Thin Straw Premature spillage Oral - Puree Premature spillage Oral - Mech Soft --  Oral - Regular Premature spillage Oral - Multi-Consistency -- Oral - Pill -- Oral Phase - Comment --  CHL IP PHARYNGEAL PHASE 08/30/2016 Pharyngeal Phase WFL Pharyngeal- Pudding Teaspoon -- Pharyngeal -- Pharyngeal- Pudding Cup -- Pharyngeal -- Pharyngeal- Honey Teaspoon -- Pharyngeal -- Pharyngeal- Honey Cup -- Pharyngeal -- Pharyngeal- Nectar Teaspoon -- Pharyngeal -- Pharyngeal- Nectar Cup -- Pharyngeal -- Pharyngeal- Nectar Straw -- Pharyngeal -- Pharyngeal- Thin Teaspoon -- Pharyngeal -- Pharyngeal- Thin Cup -- Pharyngeal -- Pharyngeal- Thin Straw -- Pharyngeal -- Pharyngeal- Puree -- Pharyngeal -- Pharyngeal- Mechanical Soft -- Pharyngeal -- Pharyngeal- Regular -- Pharyngeal -- Pharyngeal- Multi-consistency -- Pharyngeal -- Pharyngeal- Pill -- Pharyngeal -- Pharyngeal Comment --  CHL IP CERVICAL ESOPHAGEAL PHASE 08/30/2016 Cervical Esophageal Phase WFL Pudding Teaspoon -- Pudding Cup -- Honey Teaspoon -- Honey Cup -- Nectar Teaspoon -- Nectar Cup -- Nectar Straw -- Thin Teaspoon -- Thin Cup -- Thin Straw -- Puree -- Mechanical Soft -- Regular -- Multi-consistency -- Pill -- Cervical Esophageal Comment -- No flowsheet data found. Kern Reap, MA, CCC-SLP 08/30/2016, 11:18 AM [phone]               Scheduled Meds: . enoxaparin (LOVENOX) injection  40 mg Subcutaneous Daily  . insulin aspart  0-9 Units Subcutaneous Q4H  . insulin glargine  15 Units Subcutaneous QHS  . levofloxacin (LEVAQUIN) IV  750 mg Intravenous Q24H  . mupirocin cream   Topical Daily  . vancomycin  750 mg Intravenous Q12H   Continuous Infusions: . sodium chloride 125 mL/hr at 08/30/16 0513    Marzetta Board, MD, PhD [facility] Pager [phone]  If 7PM-7AM, please contact night-coverage [email or web address] Password [email or web address] 08/30/2016, 12:48 PM

## 2016-08-30 NOTE — Progress Notes (Signed)
Modified Barium Swallow Progress Note  Patient Details  Name: [name] MRN: [id number] Date of Birth: 1940/11/13  Today's Date: 08/30/2016  Modified Barium Swallow completed.  Full report located under Chart Review in the Imaging Section.  Brief recommendations include the following:  Clinical Impression  Pt currently with oropharyngeal swallow function within functional limits. Pt does have premature spill to the level of the vallecula across consistencies but this did not result in any penetration or aspiration with numerous trials provided. Recommend initiating regular diet, thin liquids, meds whole with liquid, full supervision to assist with feeding during meals; also follow reflux precautions given hx of GERD. Worth noting that pt is concerned about having "hard foods" after not eating for a couple of days and may have preference for soft solids initially.SLP will sign off at this time; please re-consult if needs arise.    Swallow Evaluation Recommendations       SLP Diet Recommendations: Regular solids;Thin liquid   Liquid Administration via: Cup;Straw   Medication Administration: Whole meds with liquid   Supervision: Staff to assist with self feeding   Compensations: Slow rate;Small sips/bites   Postural Changes: Seated upright at 90 degrees;Remain semi-upright after after feeds/meals (Comment)   Oral Care Recommendations: Oral care BID        Kern Reap, MA, CCC-SLP 08/30/2016,11:21 AM  [phone]

## 2016-08-30 NOTE — Progress Notes (Addendum)
STARKEISHA, BANTA ([id number]) Visit Report for 08/28/2016 Arrival Information Details Patient Name: [name], [name] Date of Service: 08/28/2016 1:30 PM Medical Record Number: [id number] Patient Account Number: [id number] Date of Birth/Sex: 08-09-1940 (76 y.o. Female) Treating RN: Carolyne Fiscal, Debi Primary Care Karem Farha: Beverlyn Roux Other Clinician: Referring Geary Rufo: Beverlyn Roux Treating Keileigh Vahey/Extender: Tito Dine in Treatment: 2 Visit Information History Since Last Visit All ordered tests and consults were completed: No Patient Arrived: Wheel Chair Added or deleted any medications: No Arrival Time: 14:06 Any new allergies or adverse reactions: No Accompanied By: husband Had a fall or experienced change in No Transfer Assistance: EasyPivot Patient activities of daily living that may affect Lift risk of falls: Patient Identification Verified: Yes Signs or symptoms of abuse/neglect since last No Secondary Verification Process Yes visito Completed: Hospitalized since last visit: No Patient Requires Transmission- No Has Dressing in Place as Prescribed: Yes Based Precautions: Has Compression in Place as Prescribed: Yes Patient Has Alerts: Yes Pain Present Now: No Patient Alerts: Plavix, and 325mg  Aspirin Type II Diabetic Electronic Signature(s) Signed: 08/29/2016 3:37:28 PM By: Alric Quan Entered By: Alric Quan on 08/28/2016 14:11:18 Daher, Meaghan V. ([id number]) -------------------------------------------------------------------------------- Encounter Discharge Information Details Patient Name: [name], [name] Date of Service: 08/28/2016 1:30 PM Medical Record Number: [id number] Patient Account Number: [id number] Date of Birth/Sex: May 15, 1941 (76 y.o. Female) Treating RN: Carolyne Fiscal, Debi Primary Care Katherina Wimer: Beverlyn Roux Other Clinician: Referring Abbeygail Igoe: Beverlyn Roux Treating Shenaya Lebo/Extender: Tito Dine in Treatment: 2 Encounter  Discharge Information Items Discharge Pain Level: 0 Discharge Condition: Stable Ambulatory Status: Wheelchair Discharge Destination: Home Transportation: Private Auto Accompanied By: spouse Schedule Follow-up Appointment: Yes Medication Reconciliation completed and provided to Patient/Care No Cherylann Hobday: Provided on Clinical Summary of Care: 08/28/2016 Form Type Recipient Paper Patient [facility] Electronic Signature(s) Signed: 08/28/2016 2:55:54 PM By: Ruthine Dose Entered By: Ruthine Dose on 08/28/2016 14:55:54 Magel, Navika V. ([id number]) -------------------------------------------------------------------------------- Lower Extremity Assessment Details Patient Name: [name], [name] Date of Service: 08/28/2016 1:30 PM Medical Record Number: [id number] Patient Account Number: [id number] Date of Birth/Sex: 12/27/1940 (76 y.o. Female) Treating RN: Carolyne Fiscal, Debi Primary Care Shaena Parkerson: Beverlyn Roux Other Clinician: Referring Mehul Rudin: Beverlyn Roux Treating Laina Guerrieri/Extender: Tito Dine in Treatment: 2 Edema Assessment Assessed: [Left: No] [Right: No] Edema: [Left: Ye] [Right: s] Calf Left: Right: Point of Measurement: 32 cm From Medial Instep cm 38.2 cm Ankle Left: Right: Point of Measurement: 10 cm From Medial Instep cm 28.5 cm Vascular Assessment Pulses: Dorsalis Pedis Palpable: [Right:Yes] Posterior Tibial Extremity colors, hair growth, and conditions: Extremity Color: [Right:Hyperpigmented] Temperature of Extremity: [Right:Warm] Capillary Refill: [Right:< 3 seconds] Toe Nail Assessment Left: Right: Thick: Yes Discolored: Yes Deformed: Yes Improper Length and Hygiene: Yes Electronic Signature(s) Signed: 08/29/2016 3:37:28 PM By: Alric Quan Entered By: Alric Quan on 08/28/2016 14:25:27 Pulliam, [place] ([id number]) -------------------------------------------------------------------------------- Multi Wound Chart Details Patient Name: [name], [name] Date of Service: 08/28/2016 1:30 PM Medical Record Number: [id number] Patient Account Number: [id number] Date of Birth/Sex: 05-31-1941 (76 y.o. 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 the Lower Extremity Comorbid History: Cataracts, Chronic sinus Cataracts, Chronic sinus Cataracts, Chronic sinus problems/congestion, problems/congestion, problems/congestion, Congestive Heart Failure, Congestive Heart Failure, Congestive Heart Failure, Hypertension, Type II Hypertension, Type II Hypertension, Type II Diabetes Diabetes Diabetes Date Acquired: 02/05/2016 08/09/2016 07/26/2016 Weeks of Treatment: 2 2 2  Wound Status: Open Open Open Clustered Wound: Yes No No Clustered Quantity: 2 N/A N/A Measurements L x W x D 8x0.3x0.2 3.3x5.8x0.1 0.4x0.3x0.5 (cm) Area (cm) : 1.885 15.033 0.094 Volume (cm) : 0.377 1.503 0.047 % Reduction in Area: 74.70% 24.70% 20.30% % Reduction in Volume: 83.20% 24.70% 60.20% Position 1 (o'clock): 12 Maximum Distance 1 0.4 (cm): Tunneling: No No Yes Migliaccio, Cicley V. ([id number]) Classification: Grade 2 Grade 2 Grade 2 Exudate Amount: Large Medium Large Exudate Type: Purulent Serous Purulent Exudate Color: yellow, brown, green amber yellow, brown, green Wound Margin: Flat and Intact Flat and Intact Indistinct, nonvisible Granulation Amount: Small (1-33%)  Medium (34-66%) Small (1-33%) Granulation Quality: Pink Red Red Necrotic Amount: Large (67-100%) Medium (34-66%) Medium (34-66%) Necrotic Tissue: Adherent Athena Exposed Structures: Fascia: No Fat Layer (Subcutaneous Muscle: Yes Fat Layer (Subcutaneous Tissue) Exposed: Yes Bone: Yes Tissue) Exposed: No Fascia: No Fascia: No Tendon: No Tendon: No Fat Layer (Subcutaneous Muscle: No Muscle: No Tissue) Exposed: No Joint: No Joint: No Tendon: No Bone: No Bone: No Joint: No Limited to Skin Breakdown Epithelialization: None None None Debridement: Debridement [id number] Debridement [id number] N/A 11047) 11047) Pre-procedure 14:38 14:38 N/A Verification/Time Out Taken: Pain Control: Lidocaine 4% Topical Lidocaine 4% Topical N/A Solution Solution Tissue Debrided: Fibrin/Slough, Exudates, Fibrin/Slough, Exudates, N/A Subcutaneous Subcutaneous Level: Skin/Subcutaneous Skin/Subcutaneous N/A Tissue Tissue Debridement Area (sq 2.4 19.14 N/A cm): Instrument: Curette Curette N/A Bleeding: Minimum Minimum N/A Hemostasis Achieved: Pressure Pressure N/A Procedural Pain: 0 0 N/A Post Procedural Pain: 0 0 N/A Debridement Treatment Procedure was tolerated Procedure was tolerated N/A Response: well well Post Debridement 8x0.3x0.2 3.3x5.8x0.1 N/A Measurements L x W x D (cm) Post Debridement 0.377 1.503 N/A Volume: (cm) Periwound Skin Texture: Scarring: Yes Excoriation: Yes Excoriation: No Excoriation: No Induration: No Induration: No Induration: No Callus: No Callus: No Callus: No Crepitus: No Crepitus: No Crepitus: No Rash: No Rash: No Rash: No Scarring: No Scarring: No Dearcos, Joleena V. ([id number]) Periwound Skin Maceration: No Maceration: No Maceration: No Moisture: Dry/Scaly: No Dry/Scaly: No Dry/Scaly: No Periwound Skin Color: Ecchymosis: Yes Atrophie Blanche: No Atrophie Blanche: No Atrophie Blanche: No Cyanosis:  No Cyanosis: No Cyanosis: No Ecchymosis: No Ecchymosis: No Erythema: No Erythema: No Erythema: No Hemosiderin Staining: No Hemosiderin Staining: No Hemosiderin Staining: No Mottled: No Mottled: No Mottled: No Pallor: No Pallor: No Pallor: No Rubor: No Rubor: No Rubor: No Tenderness on No No No Palpation: Wound Preparation: Ulcer Cleansing: Ulcer Cleansing: Ulcer Cleansing: Other: Rinsed/Irrigated with Rinsed/Irrigated with soap and water Saline Saline Topical Anesthetic Topical Anesthetic Topical Anesthetic Applied: Other: lidocaine Applied: Other: lidocaine Applied: Other: lidocaine 4% 4% 4% Procedures Performed: Debridement Debridement N/A Wound Number: 4 N/A N/A Photos: N/A N/A Wound Location: Right Toe Second - N/A N/A Plantar Wounding Event: Trauma N/A N/A Primary Etiology: Diabetic Wound/Ulcer of N/A N/A the Lower Extremity Comorbid History: Cataracts, Chronic sinus N/A N/A problems/congestion, Congestive Heart Failure, Hypertension, Type II Diabetes Date Acquired: 07/26/2016 N/A N/A Weeks of Treatment: 2 N/A N/A Wound Status: Open N/A N/A Clustered Wound: No N/A N/A Clustered Quantity: N/A N/A N/A Measurements L x W x D 0.1x0.1x0.1 N/A N/A (cm) Area (cm) : 0.008 N/A N/A  Volume (cm) : 0.001 N/A N/A % Reduction in Area: 99.80% N/A N/A % Reduction in Volume: 99.80% N/A N/A Tunneling: No N/A N/A Balducci, Dalisa V. ([id number]) Classification: Grade 0 N/A N/A Exudate Amount: None Present N/A N/A Exudate Type: N/A N/A N/A Exudate Color: N/A N/A N/A Wound Margin: Indistinct, nonvisible N/A N/A Granulation Amount: None Present (0%) N/A N/A Granulation Quality: N/A N/A N/A Necrotic Amount: None Present (0%) N/A N/A Necrotic Tissue: N/A N/A N/A Exposed Structures: Fascia: No N/A N/A Fat Layer (Subcutaneous Tissue) Exposed: No Tendon: No Muscle: No Joint: No Bone: No Limited to Skin Breakdown Epithelialization: Large (67-100%) N/A  N/A Debridement: N/A N/A N/A Pain Control: N/A N/A N/A Tissue Debrided: N/A N/A N/A Level: N/A N/A N/A Debridement Area (sq N/A N/A N/A cm): Instrument: N/A N/A N/A Bleeding: N/A N/A N/A Hemostasis Achieved: N/A N/A N/A Procedural Pain: N/A N/A N/A Post Procedural Pain: N/A N/A N/A Debridement Treatment N/A N/A N/A Response: Post Debridement N/A N/A N/A Measurements L x W x D (cm) Post Debridement N/A N/A N/A Volume: (cm) Periwound Skin Texture: Excoriation: No N/A N/A Induration: No Callus: No Crepitus: No Rash: No Scarring: No Periwound Skin Maceration: No N/A N/A Moisture: Dry/Scaly: No Periwound Skin Color: Atrophie Blanche: No N/A N/A Cyanosis: No Ecchymosis: No Erythema: No Mangrum, Trenna V. ([id number]) Hemosiderin Staining: No Mottled: No Pallor: No Rubor: No Tenderness on No N/A N/A Palpation: Wound Preparation: Ulcer Cleansing: N/A N/A Rinsed/Irrigated with Saline Topical Anesthetic Applied: None Procedures Performed: N/A N/A N/A Treatment Notes Wound #1 (Right Lower Leg) 1. Cleansed with: Cleanse wound with antibacterial soap and water 2. Anesthetic Topical Lidocaine 4% cream to wound bed prior to debridement 4. Dressing Applied: Prisma Ag 5. Secondary Dressing Applied ABD Pad 7. Secured with Tape Other (specify in notes) Notes kerlix and coban wrap Wound #2 (Right, Distal, Medial Lower Leg) 1. Cleansed with: Cleanse wound with antibacterial soap and water 2. Anesthetic Topical Lidocaine 4% cream to wound bed prior to debridement 4. Dressing Applied: Prisma Ag 5. Secondary Dressing Applied ABD Pad 7. Secured with Tape Other (specify in notes) Notes kerlix and coban wrap Wound #3 (Right, Dorsal Toe Great) Chura, Arora V. ([id number]) 1. Cleansed with: Clean wound with Normal Saline 4. Dressing Applied: Other dressing (specify in notes) Notes coverlet Wound #4 (Right, Plantar Toe Second) 1. Cleansed with: Cleanse wound with  antibacterial soap and water 2. Anesthetic Topical Lidocaine 4% cream to wound bed prior to debridement 4. Dressing Applied: Prisma Ag 5. Secondary Dressing Applied ABD Pad 7. Secured with Tape Other (specify in notes) Notes kerlix and coban wrap Electronic Signature(s) Signed: 08/28/2016 6:11:14 PM By: Linton Ham MD Entered By: Linton Ham on 08/28/2016 17:36:17 Garson, Tanaya [name] ([id number]) -------------------------------------------------------------------------------- Multi-Disciplinary Care Plan Details Patient Name: Cones, Nova V. Date of Service: 08/28/2016 1:30 PM Medical Record Number: [id number] Patient Account Number: [id number] Date of Birth/Sex: 1941/06/11 (76 y.o. Female) Treating RN: Carolyne Fiscal, Debi Primary Care Tamim Skog: Beverlyn Roux Other Clinician: Referring Cherise Fedder: Beverlyn Roux Treating Maleak Brazzel/Extender: Tito Dine in Treatment: 2 Active Inactive Electronic Signature(s) Signed: 09/25/2016 11:18:46 AM By: Gretta Cool RN, BSN, Kim RN, BSN Signed: 12/03/2016 4:43:15 PM By: Alric Quan Previous Signature: 08/29/2016 3:37:28 PM Version By: Alric Quan Entered By: Gretta Cool RN, BSN, Kim on 09/25/2016 11:18:46 Merkel, Anni V. ([id number]) -------------------------------------------------------------------------------- Pain Assessment Details Patient Name: Essex, Bayler V. Date of Service: 08/28/2016 1:30 PM Medical Record Number: [id number] Patient Account Number: [id number] Date of Birth/Sex: 1941-07-07 (76 y.o. Female) Treating RN: Carolyne Fiscal,  Debi Primary Care Alexzander Dolinger: Beverlyn Roux Other Clinician: Referring Abelino Tippin: Beverlyn Roux Treating Lanina Larranaga/Extender: Tito Dine in Treatment: 2 Active Problems Location of Pain Severity and Description of Pain Patient Has Paino No Site Locations With Dressing Change: No Pain Management and Medication Current Pain Management: Electronic Signature(s) Signed: 08/29/2016 3:37:28 PM By:  Alric Quan Entered By: Alric Quan on 08/28/2016 14:11:25 Louthan, Meredeth V. ([id number]) -------------------------------------------------------------------------------- Patient/Caregiver Education Details Patient Name: Friebel, Yanique V. Date of Service: 08/28/2016 1:30 PM Medical Record Patient Account Number: [id number] Number: Treating RN: Ahmed Prima [birth year] (75 y.o. Other Clinician: Date of Birth/Gender: Female) Treating ROBSON, [name] Primary Care Physician: Beverlyn Roux Physician/Extender: G Referring Physician: Dorthula Nettles in Treatment: 2 Education Assessment Education Provided To: Patient and Caregiver Education Topics Provided Venous: Handouts: Other: leg elevation Methods: Explain/Verbal Responses: State content correctly Electronic Signature(s) Signed: 08/29/2016 3:37:28 PM By: Alric Quan Entered By: Alric Quan on 08/28/2016 14:50:28 Reining, Kahliya V. ([id number]) -------------------------------------------------------------------------------- Wound Assessment Details Patient Name: Scheirer, Keyanna V. Date of Service: 08/28/2016 1:30 PM Medical Record Number: [id number] Patient Account Number: [id number] Date of Birth/Sex: Sep 15, 1940 (76 y.o. Female) Treating RN: Carolyne Fiscal, Debi Primary Care Laniqua Torrens: Beverlyn Roux Other Clinician: Referring Taraann Olthoff: Beverlyn Roux Treating Zhara Gieske/Extender: Tito Dine in Treatment: 2 Wound Status Wound Number: 1 Primary Diabetic Wound/Ulcer of the Lower Etiology: Extremity Wound Location: Right Lower Leg Wound Open Wounding Event: Surgical Injury Status: Date Acquired: 02/05/2016 Comorbid Cataracts, Chronic sinus Weeks Of Treatment: 2 History: problems/congestion, Congestive Heart Clustered Wound: Yes Failure, Hypertension, Type II Diabetes Photos Photo Uploaded By: Gretta Cool, RN, BSN, Kim on 08/28/2016 16:05:12 Wound Measurements Length: (cm) 8 Width: (cm) 0.3 Depth: (cm)  0.2 Clustered Quantity: 2 Area: (cm) 1.885 Volume: (cm) 0.377 % Reduction in Area: 74.7% % Reduction in Volume: 83.2% Epithelialization: None Tunneling: No Undermining: No Wound Description Classification: Grade 2 Foul Odor Afte Wound Margin: Flat and Intact Exudate Amount: Large Exudate Type: Purulent Exudate Color: yellow, brown, green r Cleansing: No Wound Bed Granulation Amount: Small (1-33%) Exposed Structure Granulation Quality: Pink Fascia Exposed: No Necrotic Amount: Large (67-100%) Fat Layer (Subcutaneous Tissue) Exposed: No Necrotic Quality: Adherent Slough Tendon Exposed: No Muscle Exposed: No Smotherman, Diedre V. ([id number]) Joint Exposed: No Bone Exposed: No Limited to Skin Breakdown Periwound Skin Texture Texture Color No Abnormalities Noted: No No Abnormalities Noted: No Callus: No Atrophie Blanche: No Crepitus: No Cyanosis: No Excoriation: No Ecchymosis: Yes Induration: No Erythema: No Rash: No Hemosiderin Staining: No Scarring: Yes Mottled: No Pallor: No Moisture Rubor: No No Abnormalities Noted: No Dry / Scaly: No Maceration: No Wound Preparation Ulcer Cleansing: Rinsed/Irrigated with Saline Topical Anesthetic Applied: Other: lidocaine 4%, Electronic Signature(s) Signed: 08/29/2016 3:37:28 PM By: Alric Quan Entered By: Alric Quan on 08/28/2016 14:24:17 Frison, Desarea V. ([id number]) -------------------------------------------------------------------------------- Wound Assessment Details Patient Name: Reth, Chenae V. Date of Service: 08/28/2016 1:30 PM Medical Record Number: [id number] Patient Account Number: [id number] Date of Birth/Sex: 11/25/1940 (76 y.o. Female) Treating RN: Carolyne Fiscal, Debi Primary Care Fernandez Kenley: Beverlyn Roux Other Clinician: Referring Parmvir Boomer: Beverlyn Roux Treating Bee Hammerschmidt/Extender: Tito Dine in Treatment: 2 Wound Status Wound Number: 2 Primary Diabetic Wound/Ulcer of the Lower Etiology:  Extremity Wound Location: Right Lower Leg - Medial, Distal Wound Open Status: Wounding Event: Blister Comorbid Cataracts, Chronic sinus Date Acquired: 08/09/2016 History: problems/congestion, Congestive Heart Weeks Of Treatment: 2 Failure, Hypertension, Type II Diabetes Clustered Wound: No Photos Wound Measurements Length: (cm) 3.3 Width: (cm) 5.8 Depth: (cm) 0.1 Area: (cm) 15.033 Volume: (cm) 1.503 %  Reduction in Area: 24.7% % Reduction in Volume: 24.7% Epithelialization: None Tunneling: No Undermining: No Wound Description Classification: Grade 2 Foul Odor Afte Wound Margin: Flat and Intact Exudate Amount: Medium Exudate Type: Serous Exudate Color: amber r Cleansing: No Wound Bed Granulation Amount: Medium (34-66%) Exposed Structure Granulation Quality: Red Fascia Exposed: No Necrotic Amount: Medium (34-66%) Fat Layer (Subcutaneous Tissue) Exposed: Yes Necrotic Quality: Adherent Slough Tendon Exposed: No Muscle Exposed: No Joint Exposed: No Bone Exposed: No Heminger, Vesper V. ([id number]) Periwound Skin Texture Texture Color No Abnormalities Noted: No No Abnormalities Noted: No Callus: No Atrophie Blanche: No Crepitus: No Cyanosis: No Excoriation: Yes Ecchymosis: No Induration: No Erythema: No Rash: No Hemosiderin Staining: No Scarring: No Mottled: No Pallor: No Moisture Rubor: No No Abnormalities Noted: No Dry / Scaly: No Maceration: No Wound Preparation Ulcer Cleansing: Rinsed/Irrigated with Saline Topical Anesthetic Applied: Other: lidocaine 4%, Electronic Signature(s) Signed: 08/28/2016 4:26:24 PM By: Gretta Cool RN, BSN, Kim RN, BSN Signed: 08/29/2016 3:37:28 PM By: Alric Quan Entered By: Gretta Cool RN, BSN, Kim on 08/28/2016 16:07:48 Ratterree, Merelin [name] ([id number]) -------------------------------------------------------------------------------- Wound Assessment Details Patient Name: Swiatek, Sabrena V. Date of Service: 08/28/2016 1:30 PM Medical  Record Number: [id number] Patient Account Number: [id number] Date of Birth/Sex: 06/11/1941 (76 y.o. Female) Treating RN: Carolyne Fiscal, Debi Primary Care Joshau Code: Beverlyn Roux Other Clinician: Referring Clotile Whittington: Beverlyn Roux Treating Delitha Elms/Extender: Tito Dine in Treatment: 2 Wound Status Wound Number: 3 Primary Diabetic Wound/Ulcer of the Lower Etiology: Extremity Wound Location: Right Toe Great - Dorsal Wound Open Wounding Event: Surgical Injury Status: Date Acquired: 07/26/2016 Comorbid Cataracts, Chronic sinus Weeks Of Treatment: 2 History: problems/congestion, Congestive Heart Clustered Wound: No Failure, Hypertension, Type II Diabetes Photos Photo Uploaded By: Gretta Cool, RN, BSN, Kim on 08/28/2016 16:06:32 Wound Measurements Length: (cm) 0.4 Width: (cm) 0.3 Depth: (cm) 0.5 Area: (cm) 0.094 Volume: (cm) 0.047 % Reduction in Area: 20.3% % Reduction in Volume: 60.2% Epithelialization: None Tunneling: Yes Position (o'clock): 12 Maximum Distance: (cm) 0.4 Undermining: No Wound Description Classification: Grade 2 Wound Margin: Indistinct, nonvisible Exudate Amount: Large Exudate Type: Purulent Exudate Color: yellow, brown, green Foul Odor After Cleansing: No Wound Bed Granulation Amount: Small (1-33%) Exposed Structure Granulation Quality: Red Fascia Exposed: No Necrotic Amount: Medium (34-66%) Fat Layer (Subcutaneous Tissue) Exposed: No Creppel, Yaneisy V. ([id number]) Necrotic Quality: Eschar, Adherent Slough Tendon Exposed: No Muscle Exposed: Yes Necrosis of Muscle: No Joint Exposed: No Bone Exposed: Yes Periwound Skin Texture Texture Color No Abnormalities Noted: No No Abnormalities Noted: No Callus: No Atrophie Blanche: No Crepitus: No Cyanosis: No Excoriation: No Ecchymosis: No Induration: No Erythema: No Rash: No Hemosiderin Staining: No Scarring: No Mottled: No Pallor: No Moisture Rubor: No No Abnormalities Noted: No Dry /  Scaly: No Maceration: No Wound Preparation Ulcer Cleansing: Other: soap and water, Topical Anesthetic Applied: Other: lidocaine 4%, Electronic Signature(s) Signed: 08/29/2016 3:37:28 PM By: Alric Quan Entered By: Alric Quan on 08/28/2016 14:23:07 Humiston, Daphane V. ([id number]) -------------------------------------------------------------------------------- Wound Assessment Details Patient Name: Stehlik, Adryana V. Date of Service: 08/28/2016 1:30 PM Medical Record Number: [id number] Patient Account Number: [id number] Date of Birth/Sex: 04-21-1941 (76 y.o. Female) Treating RN: Carolyne Fiscal, Debi Primary Care Criston Chancellor: Beverlyn Roux Other Clinician: Referring Amylee Lodato: Beverlyn Roux Treating Ayson Cherubini/Extender: Tito Dine in Treatment: 2 Wound Status Wound Number: 4 Primary Diabetic Wound/Ulcer of the Lower Etiology: Extremity Wound Location: Right Toe Second - Plantar Wound Open Wounding Event: Trauma Status: Date Acquired: 07/26/2016 Comorbid Cataracts, Chronic sinus Weeks Of Treatment: 2 History: problems/congestion, Congestive Heart Clustered Wound:  No Failure, Hypertension, Type II Diabetes Photos Photo Uploaded By: Gretta Cool, RN, BSN, Kim on 08/28/2016 16:06:32 Wound Measurements Length: (cm) 0.1 Width: (cm) 0.1 Depth: (cm) 0.1 Area: (cm) 0.008 Volume: (cm) 0.001 % Reduction in Area: 99.8% % Reduction in Volume: 99.8% Epithelialization: Large (67-100%) Tunneling: No Undermining: No Wound Description Classification: Grade 0 Wound Margin: Indistinct, nonvisible Exudate Amount: None Present Wound Bed Granulation Amount: None Present (0%) Exposed Structure Necrotic Amount: None Present (0%) Fascia Exposed: No Fat Layer (Subcutaneous Tissue) Exposed: No Tendon Exposed: No Muscle Exposed: No Joint Exposed: No Bone Exposed: No Limited to Skin Breakdown Bown, Aunika V. ([id number]) Periwound Skin Texture Texture Color No Abnormalities Noted: No No  Abnormalities Noted: No Callus: No Atrophie Blanche: No Crepitus: No Cyanosis: No Excoriation: No Ecchymosis: No Induration: No Erythema: No Rash: No Hemosiderin Staining: No Scarring: No Mottled: No Pallor: No Moisture Rubor: No No Abnormalities Noted: No Dry / Scaly: No Maceration: No Wound Preparation Ulcer Cleansing: Rinsed/Irrigated with Saline Topical Anesthetic Applied: None Electronic Signature(s) Signed: 08/29/2016 3:37:28 PM By: Alric Quan Entered By: Alric Quan on 08/28/2016 14:24:35 Remigio, Baylen V. ([id number]) -------------------------------------------------------------------------------- Vitals Details Patient Name: Babineaux, Fayrene V. Date of Service: 08/28/2016 1:30 PM Medical Record Number: [id number] Patient Account Number: [id number] Date of Birth/Sex: 1940-11-29 (76 y.o. Female) Treating RN: Carolyne Fiscal, Debi Primary Care Senay Sistrunk: Beverlyn Roux Other Clinician: Referring Daila Elbert: Beverlyn Roux Treating Ajiah Mcglinn/Extender: Tito Dine in Treatment: 2 Vital Signs Time Taken: 14:11 Temperature (F): 97.7 Height (in): 64 Pulse (bpm): 68 Weight (lbs): 216 Respiratory Rate (breaths/min): 16 Body Mass Index (BMI): 37.1 Blood Pressure (mmHg): 147/57 Reference Range: 80 - 120 mg / dl Electronic Signature(s) Signed: 08/29/2016 3:37:28 PM By: Alric Quan Entered By: Alric Quan on 08/28/2016 14:13:11

## 2016-08-31 ENCOUNTER — Inpatient Hospital Stay (HOSPITAL_COMMUNITY): Payer: Medicare HMO

## 2016-08-31 DIAGNOSIS — R7881 Bacteremia: Secondary | ICD-10-CM | POA: Diagnosis present

## 2016-08-31 LAB — BASIC METABOLIC PANEL
Anion gap: 7 (ref 5–15)
BUN: 11 mg/dL (ref 6–20)
CALCIUM: 9 mg/dL (ref 8.9–10.3)
CHLORIDE: 108 mmol/L (ref 101–111)
CO2: 25 mmol/L (ref 22–32)
CREATININE: 0.77 mg/dL (ref 0.44–1.00)
GFR calc Af Amer: >60 mL/min (ref >60)
GFR calc non Af Amer: >60 mL/min (ref >60)
Glucose, Bld: 218 mg/dL — ABNORMAL HIGH (ref 65–99)
Potassium: 3.3 mmol/L — ABNORMAL LOW (ref 3.5–5.1)
Sodium: 140 mmol/L (ref 135–145)

## 2016-08-31 LAB — CULTURE, BLOOD (ROUTINE X 2)

## 2016-08-31 LAB — CBC
HCT: 28.8 % — ABNORMAL LOW (ref 36.0–46.0)
Hemoglobin: 9.2 g/dL — ABNORMAL LOW (ref 12.0–15.0)
MCH: 25.8 pg — AB (ref 26.0–34.0)
MCHC: 31.9 g/dL (ref 30.0–36.0)
MCV: 80.9 fL (ref 78.0–100.0)
PLATELETS: 126 10*3/uL — AB (ref 150–400)
RBC: 3.56 MIL/uL — ABNORMAL LOW (ref 3.87–5.11)
RDW: 16.2 % — AB (ref 11.5–15.5)
WBC: 13.6 10*3/uL — ABNORMAL HIGH (ref 4.0–10.5)

## 2016-08-31 LAB — GLUCOSE, CAPILLARY
GLUCOSE-CAPILLARY: 260 mg/dL — AB (ref 65–99)
Glucose-Capillary: 219 mg/dL — ABNORMAL HIGH (ref 65–99)
Glucose-Capillary: 193 mg/dL — ABNORMAL HIGH (ref 65–99)
Glucose-Capillary: 236 mg/dL — ABNORMAL HIGH (ref 65–99)
Glucose-Capillary: 245 mg/dL — ABNORMAL HIGH (ref 65–99)
Glucose-Capillary: 289 mg/dL — ABNORMAL HIGH (ref 65–99)

## 2016-08-31 LAB — STREP PNEUMONIAE URINARY ANTIGEN: STREP PNEUMO URINARY ANTIGEN: NEGATIVE

## 2016-08-31 MED ORDER — FUROSEMIDE 10 MG/ML IJ SOLN
40.0000 mg | Freq: Once | INTRAMUSCULAR | Status: AC
  Administered 2016-08-31: 40 mg via INTRAVENOUS
  Filled 2016-08-31: qty 4

## 2016-08-31 MED ORDER — POTASSIUM CHLORIDE CRYS ER 20 MEQ PO TBCR
40.0000 meq | EXTENDED_RELEASE_TABLET | Freq: Once | ORAL | Status: AC
  Administered 2016-08-31: 40 meq via ORAL
  Filled 2016-08-31: qty 2

## 2016-08-31 MED ORDER — SODIUM CHLORIDE 0.9% FLUSH
10.0000 mL | INTRAVENOUS | Status: DC | PRN
  Administered 2016-09-05: 20 mL via INTRAVENOUS
  Administered 2016-09-17: 10 mL via INTRAVENOUS
  Filled 2016-08-31 (×2): qty 20

## 2016-08-31 MED ORDER — SODIUM CHLORIDE 0.9% FLUSH
10.0000 mL | Freq: Two times a day (BID) | INTRAVENOUS | Status: DC
  Administered 2016-08-31: 20 mL via INTRAVENOUS
  Administered 2016-09-01: 10 mL via INTRAVENOUS
  Administered 2016-09-01: 20 mL via INTRAVENOUS
  Administered 2016-09-02 – 2016-09-10 (×17): 10 mL via INTRAVENOUS
  Administered 2016-09-11: 3 mL via INTRAVENOUS
  Administered 2016-09-11 – 2016-09-17 (×9): 10 mL via INTRAVENOUS

## 2016-08-31 NOTE — Progress Notes (Signed)
BiPAP removed at this time. 2L of O2 via [place] placed. No respiratory distress noted. RR 23. Will continue to monitor and assess for further BiPAP needs.  Jacqlyn Larsen, RN

## 2016-08-31 NOTE — Evaluation (Signed)
Physical Therapy Evaluation Patient Details Name: [name] MRN: [id number] DOB: 1940-11-10 Today's Date: 08/31/2016   History of Present Illness  76 YO F admitted with encephalopathy. PMH includes HTN, DM, diastolic acute CHF, PAD, RLE BPG, CVA, aortic stenosis, neuropathy, edema.   Clinical Impression  Pt with limited baseline mobility of grossly 15' ambulation with RW as a time. Per spouse "she is spoiled" at home and asks for assist for all mobility and spouse typically assists if asked. Pt with generalized weakness, impaired ability with gait and balance, decreased activity tolerance who will benefit from acute therapy to maximize mobility, function and independence to decrease burden of care.   HR 114 BP 116/87 sitting EOB 120/101 in chair after gait  sats 95% on RA  Follow Up Recommendations Supervision/Assistance - 24 hour;SNF    Equipment Recommendations  None recommended by PT    Recommendations for Other Services       Precautions / Restrictions Precautions Precautions: Fall      Mobility  Bed Mobility Overal bed mobility: Needs Assistance Bed Mobility: Rolling;Sidelying to Sit Rolling: Min assist Sidelying to sit: Mod assist;+2 for physical assistance       General bed mobility comments: cues for sequence, with assist to roll, elevate trunk, cues for use of rail, increased time with pt asking for assist before attempting any mobility  Transfers Overall transfer level: Needs assistance   Transfers: Sit to/from Stand Sit to Stand: Mod assist;+2 physical assistance;+2 safety/equipment         General transfer comment: cues for hand placement, sequence and safety x 3 trials with increased time and assist for anterior translation and elevation with feet blocked on each trial  Ambulation/Gait Ambulation/Gait assistance: Min assist;+2 safety/equipment Ambulation Distance (Feet): 5 Feet Assistive device: Rolling walker (2 wheeled) Gait Pattern/deviations:  Shuffle;Trunk flexed;Wide base of support   Gait velocity interpretation: Below normal speed for age/gender General Gait Details: cues for posture, position in RW , safety and chair to follow  Stairs            Wheelchair Mobility    Modified Rankin (Stroke Patients Only)       Balance Overall balance assessment: Needs assistance   Sitting balance-Leahy Scale: Fair       Standing balance-Leahy Scale: Poor                               Pertinent Vitals/Pain Pain Assessment: No/denies pain    Home Living Family/patient expects to be discharged to:: Private residence Living Arrangements: Spouse/significant other Available Help at Discharge: Family;Available 24 hours/day;Personal care attendant Type of Home: House Home Access: Ramped entrance     Home Layout: One level Home Equipment: [place] - 2 wheels;Wheelchair - manual;Hospital bed;Bedside commode;Grab bars - toilet;Toilet riser;Shower seat Additional Comments: spouse home and aide from 10a-9p    Prior Function Level of Independence: Needs assistance   Gait / Transfers Assistance Needed: min assist for bed mobility and all transfers, minguard for walking 15' with RW, WC for longer distance  ADL's / Homemaking Assistance Needed: aide for bathing and dressing, min assist for toileting  Comments: spouse and son present to confirm PLOF     Hand Dominance        Extremity/Trunk Assessment   Upper Extremity Assessment Upper Extremity Assessment: Generalized weakness    Lower Extremity Assessment Lower Extremity Assessment: Generalized weakness    Cervical / Trunk Assessment Cervical / Trunk Assessment:  Kyphotic  Communication   Communication: No difficulties  Cognition Arousal/Alertness: Awake/alert Behavior During Therapy: Flat affect Overall Cognitive Status: Impaired/Different from baseline Area of Impairment: Orientation;Memory;Attention;Safety/judgement Orientation Level:  Disoriented to;Time;Situation Current Attention Level: Sustained Memory: Decreased recall of precautions;Decreased short-term memory   Safety/Judgement: Decreased awareness of safety;Decreased awareness of deficits          General Comments      Exercises     Assessment/Plan    PT Assessment Patient needs continued PT services  PT Problem List Decreased strength;Decreased mobility;Decreased safety awareness;Decreased coordination;Decreased activity tolerance;Decreased balance;Decreased knowledge of use of DME;Decreased cognition          PT Treatment Interventions Gait training;Therapeutic exercise;Patient/family education;DME instruction;Therapeutic activities;Cognitive remediation;Functional mobility training;Balance training    PT Goals (Current goals can be found in the Care Plan section)  Acute Rehab PT Goals Patient Stated Goal: return home PT Goal Formulation: With patient/family Time For Goal Achievement: 09/14/16 Potential to Achieve Goals: Fair    Frequency Min 3X/week   Barriers to discharge Decreased caregiver support      Co-evaluation               End of Session Equipment Utilized During Treatment: Gait belt Activity Tolerance: Patient tolerated treatment well Patient left: in chair;with call bell/phone within reach;with nursing/sitter in room;with chair alarm set Nurse Communication: Mobility status;Precautions         Time: 1007-1030 PT Time Calculation (min) (ACUTE ONLY): 23 min   Charges:   PT Evaluation $PT Eval Moderate Complexity: 1 Procedure PT Treatments $Therapeutic Activity: 8-22 mins   PT G Codes:        Saloma Cadena B Evianna Chandran September 08, 2016, 1:04 PM Elwyn Reach, [place]

## 2016-08-31 NOTE — Progress Notes (Signed)
PROGRESS NOTE  [name] [id number] DOB: 1941/01/31 DOA: 08/28/2016 PCP: [name], MD   LOS: 2 days   Brief Narrative: In brief, complex vascular patient with history of PAD, DM, HTN, prior CVA with residual right sided weakness admitted with sepsis due to pneumonia and bacteremia  Assessment & Plan: Principal Problem:   Acute encephalopathy Active Problems:   DM2 (diabetes mellitus, type 2) ([place])   Atherosclerosis of native arteries of the extremities with ulceration ([place])   Essential hypertension   Ulcer of right lower extremity ([place])   Sepsis ([place])   Bilateral pneumonia   Sepsis due to Group B strep bacteremia - Blood cultures positive for group B strep, final results are pending, continue vancomycin and Levaquin for today. She is penicillin allergic. - Surveillance cultures obtained 1/26, no growth to date  Acute hypoxic respiratory failure - Likely due to fluid resuscitation in the setting of sepsis. Chest x-ray done last night showed slight pulmonary vascular congestion versus worsening of her pneumonia. IV fluids were discontinued 1/26, received a dose of Lasix 1/26. Improved, however still a little bit short of breath this morning. Repeat Lasix today. Wean off oxygen as tolerated  Acute on chronic diastolic CHF - Most recent 2-D echo was done roughly 6 months ago on July 2017 and showed an ejection fraction of 55-60% as well as grade 2 diastolic dysfunction. Slight fluid overload in the setting of fluid resuscitation due to sepsis  HCAP - probable strep pneumonia, awaiting final blood cultures. Respiratory status stable, she is on room air.  Acute encephalopathy - slowly resolving, she is much more alert today, this is likely due to #1 and #2   Hypertension - resume Coreg, Norvasc   Hyperlipidemia - continue statin  Type 2 diabetes mellitus - continue Lantus and sliding scale  Right lower extremity ulcer - wound care consulted   Peripheral artery  disease - Resume Plavix    DVT prophylaxis: Lovenox Code Status: Full code Family Communication: family bedside Disposition Plan: home when ready. PT eval pending  Consultants:   None   Procedures:   None   Antimicrobials:  Vancomycin 1/25 >>  Levaquin 1/25 >>   Subjective: - Has shortness of breath this morning. Still appears confused however much improved  Objective: Vitals:   08/30/16 2337 08/31/16 0100 08/31/16 0340 08/31/16 0848  BP:   (!) 168/50 (!) 181/75  Pulse:   (!) 108 (!) 108  Resp:   (!) 25   Temp:  99.4 F (37.4 C) 99.8 F (37.7 C)   TempSrc:  Oral Oral   SpO2: 100%  100%   Weight:   104.8 kg (231 lb 1.6 oz)   Height:        Intake/Output Summary (Last 24 hours) at 08/31/16 1122 Last data filed at 08/30/16 2300  Gross per 24 hour  Intake                0 ml  Output                0 ml  Net                0 ml   Filed Weights   08/29/16 0005 08/30/16 0430 08/31/16 0340  Weight: 96.6 kg (213 lb) 107.1 kg (236 lb 3.2 oz) 104.8 kg (231 lb 1.6 oz)    Examination: Constitutional: NAD, alert, mild confusion But answers all orientation questions Vitals:   08/30/16 2337 08/31/16 0100 08/31/16 0340 08/31/16  0848  BP:   (!) 168/50 (!) 181/75  Pulse:   (!) 108 (!) 108  Resp:   (!) 25   Temp:  99.4 F (37.4 C) 99.8 F (37.7 C)   TempSrc:  Oral Oral   SpO2: 100%  100%   Weight:   104.8 kg (231 lb 1.6 oz)   Height:       Eyes: PERRL, lids and conjunctivae normal Respiratory: clear to auscultation bilaterally, no wheezing, no crackles. Normal respiratory effort Cardiovascular: Regular rate and rhythm, no murmurs / rubs / gallops. No LE edema.  Abdomen: no tenderness. Bowel sounds positive.  Musculoskeletal: no clubbing / cyanosis. No joint deformity upper and lower extremities.  Skin: no rashes. RLE anterior shin wounds, no drainage, no surrounding cellulitis. Neurologic: CN 2-12 grossly intact. Strength 5/5 in all 4.  Psychiatric: Normal  judgment and insight. Alert and oriented x 3    Data Reviewed: I have personally reviewed following labs and imaging studies  CBC:  Recent Labs Lab 08/29/16 0120 08/31/16 0510  WBC 13.8* 13.6*  NEUTROABS 12.7*  --   HGB 11.3* 9.2*  HCT 35.3* 28.8*  MCV 81.1 80.9  PLT 163 [id number]*   Basic Metabolic Panel:  Recent Labs Lab 08/29/16 0120 08/31/16 0510  NA 135 140  K 3.5 3.3*  CL 99* 108  CO2 22 25  GLUCOSE 263* 218*  BUN 21* 11  CREATININE 0.95 0.77  CALCIUM 9.1 9.0   GFR: Estimated Creatinine Clearance: 73.9 mL/min (by C-G formula based on SCr of 0.77 mg/dL). Liver Function Tests:  Recent Labs Lab 08/29/16 0120  AST 26  ALT 27  ALKPHOS 69  BILITOT 0.8  PROT 6.7  ALBUMIN 3.4*   No results for input(s): LIPASE, AMYLASE in the last 168 hours.  Recent Labs Lab 08/29/16 0556  AMMONIA 34   Coagulation Profile: No results for input(s): INR, PROTIME in the last 168 hours. Cardiac Enzymes: No results for input(s): CKTOTAL, CKMB, CKMBINDEX, TROPONINI in the last 168 hours. BNP (last 3 results) No results for input(s): PROBNP in the last 8760 hours. HbA1C: No results for input(s): HGBA1C in the last 72 hours. CBG:  Recent Labs Lab 08/30/16 1623 08/30/16 2027 08/30/16 2330 08/31/16 0320 08/31/16 0734  GLUCAP 188* 203* 221* 219* 193*   Lipid Profile: No results for input(s): CHOL, HDL, LDLCALC, TRIG, CHOLHDL, LDLDIRECT in the last 72 hours. Thyroid Function Tests: No results for input(s): TSH, T4TOTAL, FREET4, T3FREE, THYROIDAB in the last 72 hours. Anemia Panel: No results for input(s): VITAMINB12, FOLATE, FERRITIN, TIBC, IRON, RETICCTPCT in the last 72 hours. Urine analysis:    Component Value Date/Time   COLORURINE YELLOW 08/29/2016 1815   APPEARANCEUR CLOUDY (A) 08/29/2016 1815   LABSPEC 1.015 08/29/2016 1815   PHURINE 5.0 08/29/2016 1815   GLUCOSEU NEGATIVE 08/29/2016 1815   HGBUR SMALL (A) 08/29/2016 1815   BILIRUBINUR NEGATIVE 08/29/2016  1815   KETONESUR NEGATIVE 08/29/2016 1815   PROTEINUR NEGATIVE 08/29/2016 1815   UROBILINOGEN 0.2 02/12/2014 0110   NITRITE NEGATIVE 08/29/2016 1815   LEUKOCYTESUR LARGE (A) 08/29/2016 1815   Sepsis Labs: Invalid input(s): PROCALCITONIN, LACTICIDVEN  Recent Results (from the past 240 hour(s))  Blood Culture (routine x 2)     Status: Abnormal   Collection Time: 08/29/16  1:27 AM  Result Value Ref Range Status   Specimen Description BLOOD LEFT ARM  Final   Special Requests BLOOD 5CC  Final   Culture  Setup Time   Final  GRAM POSITIVE COCCI IN CHAINS IN BOTH AEROBIC AND ANAEROBIC BOTTLES CRITICAL RESULT CALLED TO, READ BACK BY AND VERIFIED WITH: J. Arminger Pharm.D. 15:40 08/29/16  ([email or web address])    Culture GROUP B STREP(S.AGALACTIAE)ISOLATED (A)  Final   Report Status 08/31/2016 FINAL  Final   Organism ID, Bacteria GROUP B STREP(S.AGALACTIAE)ISOLATED  Final      Susceptibility   Group b strep(s.agalactiae)isolated - MIC*    CLINDAMYCIN >=1 RESISTANT Resistant     AMPICILLIN <=0.25 SENSITIVE Sensitive     ERYTHROMYCIN >=8 RESISTANT Resistant     VANCOMYCIN 0.5 SENSITIVE Sensitive     CEFTRIAXONE <=0.12 SENSITIVE Sensitive     LEVOFLOXACIN 1 SENSITIVE Sensitive     * GROUP B STREP(S.AGALACTIAE)ISOLATED  Blood Culture ID Panel (Reflexed)     Status: Abnormal   Collection Time: 08/29/16  1:27 AM  Result Value Ref Range Status   Enterococcus species NOT DETECTED NOT DETECTED Final   Listeria monocytogenes NOT DETECTED NOT DETECTED Final   Staphylococcus species NOT DETECTED NOT DETECTED Final   Staphylococcus aureus NOT DETECTED NOT DETECTED Final   Streptococcus species DETECTED (A) NOT DETECTED Final    Comment: CRITICAL RESULT CALLED TO, READ BACK BY AND VERIFIED WITH: J. Arminger Pharm.D. 15:40 08/29/16 ([email or web address])    Streptococcus agalactiae DETECTED (A) NOT DETECTED Final    Comment: CRITICAL RESULT CALLED TO, READ BACK BY AND VERIFIED WITH: J. Arminger Pharm.D. 15:40 08/29/16  ([email or web address])    Streptococcus pneumoniae NOT DETECTED NOT DETECTED Final   Streptococcus pyogenes NOT DETECTED NOT DETECTED Final   Acinetobacter baumannii NOT DETECTED NOT DETECTED Final   Enterobacteriaceae species NOT DETECTED NOT DETECTED Final   Enterobacter cloacae complex NOT DETECTED NOT DETECTED Final   Escherichia coli NOT DETECTED NOT DETECTED Final   Klebsiella oxytoca NOT DETECTED NOT DETECTED Final   Klebsiella pneumoniae NOT DETECTED NOT DETECTED Final   Proteus species NOT DETECTED NOT DETECTED Final   Serratia marcescens NOT DETECTED NOT DETECTED Final   Haemophilus influenzae NOT DETECTED NOT DETECTED Final   Neisseria meningitidis NOT DETECTED NOT DETECTED Final   Pseudomonas aeruginosa NOT DETECTED NOT DETECTED Final   Candida albicans NOT DETECTED NOT DETECTED Final   Candida glabrata NOT DETECTED NOT DETECTED Final   Candida krusei NOT DETECTED NOT DETECTED Final   Candida parapsilosis NOT DETECTED NOT DETECTED Final   Candida tropicalis NOT DETECTED NOT DETECTED Final  Blood Culture (routine x 2)     Status: Abnormal   Collection Time: 08/29/16  3:18 AM  Result Value Ref Range Status   Specimen Description BLOOD LEFT ARM  Final   Special Requests IN PEDIATRIC BOTTLE 2ML  Final   Culture  Setup Time   Final    GRAM POSITIVE COCCI IN CHAINS IN PEDIATRIC BOTTLE CRITICAL VALUE NOTED.  VALUE IS CONSISTENT WITH PREVIOUSLY REPORTED AND CALLED VALUE.    Culture (A)  Final    GROUP B STREP(S.AGALACTIAE)ISOLATED SUSCEPTIBILITIES PERFORMED ON PREVIOUS CULTURE WITHIN THE LAST 5 DAYS.    Report Status 08/31/2016 FINAL  Final  Urine culture     Status: None   Collection Time: 08/29/16  6:15 PM  Result Value Ref Range Status   Specimen Description URINE, CATHETERIZED  Final   Special Requests NONE  Final   Culture NO GROWTH  Final   Report Status 08/30/2016 FINAL  Final  MRSA PCR Screening     Status: None   Collection Time: 08/29/16  8:06 PM  Result Value Ref Range  Status  MRSA by PCR NEGATIVE NEGATIVE Final    Comment:        The GeneXpert MRSA Assay (FDA approved for NASAL specimens only), is one component of a comprehensive MRSA colonization surveillance program. It is not intended to diagnose MRSA infection nor to guide or monitor treatment for MRSA infections.   Culture, blood (Routine X 2) w Reflex to ID Panel     Status: None (Preliminary result)   Collection Time: 08/30/16  1:48 PM  Result Value Ref Range Status   Specimen Description BLOOD LEFT ARM  Final   Special Requests IN PEDIATRIC BOTTLE .5CC  Final   Culture NO GROWTH < 24 HOURS  Final   Report Status PENDING  Incomplete  Culture, blood (Routine X 2) w Reflex to ID Panel     Status: None (Preliminary result)   Collection Time: 08/30/16  1:48 PM  Result Value Ref Range Status   Specimen Description BLOOD LEFT ARM  Final   Special Requests IN PEDIATRIC BOTTLE 1CC  Final   Culture NO GROWTH < 24 HOURS  Final   Report Status PENDING  Incomplete      Radiology Studies: Dg Chest Port 1 View  Result Date: 08/30/2016 CLINICAL DATA:  Dyspnea. EXAM: PORTABLE CHEST 1 VIEW COMPARISON:  08/29/2016 and 07/05/2016 FINDINGS: Lungs are somewhat hypoinflated with hazy perihilar and bibasilar opacification slightly worse. Findings may be due to edema versus infection. Cannot exclude a small amount of pleural fluid. Moderate stable cardiomegaly. Remainder the exam is unchanged. IMPRESSION: Slight worsening bilateral perihilar bibasilar opacification which may be due to edema versus infection. Possible small amount of pleural fluid. Stable cardiomegaly. Electronically Signed   By: Marin Olp M.D.   On: 08/30/2016 19:26   Dg Swallowing Func-speech Pathology  Result Date: 08/30/2016 Objective Swallowing Evaluation: Type of Study: MBS-Modified Barium Swallow Study Patient Details Name: CLARIBEL BOTA MRN: [id number] Date of Birth: 12-26-1940 Today's Date: 08/30/2016 Time: SLP Start Time (ACUTE  ONLY): 1055-SLP Stop Time (ACUTE ONLY): 1112 SLP Time Calculation (min) (ACUTE ONLY): 17 min Past Medical History: Past Medical History: Diagnosis Date . Acute heart failure ([place])  . Aortic stenosis  . Complication of anesthesia   Hard to wake patient up after having anesthesia . Diabetes mellitus without complication ([place])  . Diabetic neuropathy ([facility])   Takes Lyrica . Edema of both legs   Takes Lasix . GERD (gastroesophageal reflux disease)  . High cholesterol  . HTN (hypertension)  . Hypertension  . PAD (peripheral artery disease) ([place])  . Shortness of breath dyspnea  . Spasm of back muscles  . Stroke ([place])  . Wound, open   Right great toe Past Surgical History: Past Surgical History: Procedure Laterality Date . BYPASS GRAFT POPLITEAL TO TIBIAL Right 02/28/2016  Procedure: BYPASS GRAFT RIGHT BELOW KNEE POPLITEAL TO PERONEAL USING REVERSED RIGHT GREATER SAPHENOUS VEIN;  Surgeon: Conrad Bogue [place], MD;  Location: [facility];  Service: Vascular;  Laterality: Right; . CYST EXCISION    Abdomen . EYE SURGERY Bilateral   Cataract removal . INTRAMEDULLARY (IM) NAIL INTERTROCHANTERIC Left 02/04/2014  Procedure: INTRAMEDULLARY (IM) NAIL INTERTROCHANTRIC FEMORAL;  Surgeon: Mauri Pole, MD;  Location: [facility];  Service: Orthopedics;  Laterality: Left; . IR GENERIC HISTORICAL  03/01/2016  IR ANGIO INTRA EXTRACRAN SEL COM CAROTID INNOMINATE UNI R MOD SED 03/01/2016 [name], MD [facility] . IR GENERIC HISTORICAL  03/01/2016  IR ENDOVASC INTRACRANIAL INF OTHER THAN THROMBO ART INC DIAG ANGIO 03/01/2016 [name], MD [facility] .  IR GENERIC HISTORICAL  03/01/2016  IR INTRAVSC STENT CERV CAROTID W/O EMB-PROT MOD SED INC ANGIO 03/01/2016 [name], MD [facility] . IR GENERIC HISTORICAL  06/03/2016  IR RADIOLOGIST EVAL & MGMT 06/03/2016 [facility] . ORIF TOE FRACTURE Right 02/08/2014  Procedure: OPEN REDUCTION INTERNAL FIXATION Right METATARSAL  FRACTURE ;  Surgeon: Wylene Simmer, MD;  Location: [facility];  Service:  Orthopedics;  Laterality: Right; . PERIPHERAL VASCULAR CATHETERIZATION N/A 12/28/2015  Procedure: Abdominal Aortogram w/Lower Extremity;  Surgeon: Conrad [place], MD;  Location: [facility];  Service: Cardiovascular;  Laterality: N/A; . RADIOLOGY WITH ANESTHESIA N/A 03/01/2016  Procedure: RADIOLOGY WITH ANESTHESIA;  Surgeon: [name], MD;  Location: [facility];  Service: Radiology;  Laterality: N/A; . VEIN HARVEST Right 02/28/2016  Procedure: RIGHT GREATER SAPHENOUS VEIN HARVEST;  Surgeon: Conrad , MD;  Location: [facility];  Service: Vascular;  Laterality: Right; HPI: Pt is a 76 y.o. female with PMH of DM2 and HTN, presented to ED 1/25 with AMS. Head CT negative, CXR showing patchy R upper/ bibasilar areas of superimposed airspace disease concerning for minimal PNA. Pt was followed by speech therapy in August 2017 following acute stroke; had MBS on 8/8 with recommendations for dysphagia 1/ honey thick liquids by tsp. Bedside swallow eval ordered. No Data Recorded Assessment / Plan / Recommendation CHL IP CLINICAL IMPRESSIONS 08/30/2016 Therapy Diagnosis WFL Clinical Impression Pt currently with oropharyngeal swallow function within functional limits. Pt does have premature spill to the level of the vallecula across consistencies but this did not result in any penetration or aspiration with numerous trials provided. Recommend initiating regular diet, thin liquids, meds whole with liquid, full supervision to assist with feeding during meals; also follow reflux precautions given hx of GERD. Worth noting that pt is concerned about having "hard foods" after not eating for a couple of days and may have preference for soft solids initially.SLP will sign off at this time; please re-consult if needs arise.  Impact on safety and function Mild aspiration risk   CHL IP TREATMENT RECOMMENDATION 08/30/2016 Treatment Recommendations No treatment recommended at this time           CHL IP DIET RECOMMENDATION 08/30/2016 SLP Diet  Recommendations Regular solids;Thin liquid Liquid Administration via Cup;Straw Medication Administration Whole meds with liquid Compensations Slow rate;Small sips/bites Postural Changes Seated upright at 90 degrees;Remain semi-upright after after feeds/meals (Comment)   CHL IP OTHER RECOMMENDATIONS 08/30/2016 Recommended Consults -- Oral Care Recommendations Oral care BID Other Recommendations --   CHL IP FOLLOW UP RECOMMENDATIONS 08/30/2016 Follow up Recommendations None        CHL IP ORAL PHASE 08/30/2016 Oral Phase Impaired Oral - Pudding Teaspoon -- Oral - Pudding Cup -- Oral - Honey Teaspoon -- Oral - Honey Cup -- Oral - Nectar Teaspoon -- Oral - Nectar Cup -- Oral - Nectar Straw -- Oral - Thin Teaspoon -- Oral - Thin Cup Premature spillage Oral - Thin Straw Premature spillage Oral - Puree Premature spillage Oral - Mech Soft -- Oral - Regular Premature spillage Oral - Multi-Consistency -- Oral - Pill -- Oral Phase - Comment --  CHL IP PHARYNGEAL PHASE 08/30/2016 Pharyngeal Phase WFL Pharyngeal- Pudding Teaspoon -- Pharyngeal -- Pharyngeal- Pudding Cup -- Pharyngeal -- Pharyngeal- Honey Teaspoon -- Pharyngeal -- Pharyngeal- Honey Cup -- Pharyngeal -- Pharyngeal- Nectar Teaspoon -- Pharyngeal -- Pharyngeal- Nectar Cup -- Pharyngeal -- Pharyngeal- Nectar Straw -- Pharyngeal -- Pharyngeal- Thin Teaspoon -- Pharyngeal -- Pharyngeal- Thin Cup -- Pharyngeal --  Pharyngeal- Thin Straw -- Pharyngeal -- Pharyngeal- Puree -- Pharyngeal -- Pharyngeal- Mechanical Soft -- Pharyngeal -- Pharyngeal- Regular -- Pharyngeal -- Pharyngeal- Multi-consistency -- Pharyngeal -- Pharyngeal- Pill -- Pharyngeal -- Pharyngeal Comment --  CHL IP CERVICAL ESOPHAGEAL PHASE 08/30/2016 Cervical Esophageal Phase WFL Pudding Teaspoon -- Pudding Cup -- Honey Teaspoon -- Honey Cup -- Nectar Teaspoon -- Nectar Cup -- Nectar Straw -- Thin Teaspoon -- Thin Cup -- Thin Straw -- Puree -- Mechanical Soft -- Regular -- Multi-consistency -- Pill -- Cervical  Esophageal Comment -- No flowsheet data found. Kern Reap, MA, CCC-SLP 08/30/2016, 11:18 AM [phone]               Scheduled Meds: . amLODipine  10 mg Oral Daily  . atorvastatin  20 mg Oral Daily  . carvedilol  12.5 mg Oral BID WC  . clopidogrel  75 mg Oral Q breakfast  . enoxaparin (LOVENOX) injection  40 mg Subcutaneous Daily  . insulin aspart  0-9 Units Subcutaneous Q4H  . insulin glargine  15 Units Subcutaneous QHS  . isosorbide mononitrate  60 mg Oral Daily  . levofloxacin (LEVAQUIN) IV  750 mg Intravenous Q24H  . mupirocin cream   Topical Daily  . ranolazine  500 mg Oral BID  . vancomycin  750 mg Intravenous Q12H   Continuous Infusions:   Marzetta Board, MD, PhD [facility] Pager [phone]  If 7PM-7AM, please contact night-coverage [email or web address] Password [email or web address] 08/31/2016, 11:22 AM

## 2016-08-31 NOTE — Progress Notes (Signed)
PT Cancellation Note  Patient Details Name: [name] MRN: [id number] DOB: 04/06/1941   Cancelled Treatment:    Reason Eval/Treat Not Completed: Medical issues which prohibited therapy (pt awaiting breathing tx per RN and request hold from RN)   Sandy Salaam Taylin Mans 08/31/2016, 8:56 AM Elwyn Reach, [place]

## 2016-09-01 ENCOUNTER — Other Ambulatory Visit (HOSPITAL_COMMUNITY): Payer: Medicare HMO

## 2016-09-01 LAB — CBC
HEMATOCRIT: 28 % — AB (ref 36.0–46.0)
HEMOGLOBIN: 8.8 g/dL — AB (ref 12.0–15.0)
MCH: 25.7 pg — AB (ref 26.0–34.0)
MCHC: 31.4 g/dL (ref 30.0–36.0)
MCV: 81.6 fL (ref 78.0–100.0)
Platelets: 131 10*3/uL — ABNORMAL LOW (ref 150–400)
RBC: 3.43 MIL/uL — ABNORMAL LOW (ref 3.87–5.11)
RDW: 16.5 % — ABNORMAL HIGH (ref 11.5–15.5)
WBC: 14.3 10*3/uL — ABNORMAL HIGH (ref 4.0–10.5)

## 2016-09-01 LAB — BASIC METABOLIC PANEL
Anion gap: 6 (ref 5–15)
BUN: 6 mg/dL (ref 6–20)
CHLORIDE: 107 mmol/L (ref 101–111)
CO2: 28 mmol/L (ref 22–32)
CREATININE: 0.68 mg/dL (ref 0.44–1.00)
Calcium: 9.2 mg/dL (ref 8.9–10.3)
GFR calc Af Amer: >60 mL/min (ref >60)
GFR calc non Af Amer: >60 mL/min (ref >60)
GLUCOSE: 217 mg/dL — AB (ref 65–99)
Potassium: 4 mmol/L (ref 3.5–5.1)
Sodium: 141 mmol/L (ref 135–145)

## 2016-09-01 LAB — GLUCOSE, CAPILLARY
GLUCOSE-CAPILLARY: 210 mg/dL — AB (ref 65–99)
GLUCOSE-CAPILLARY: 187 mg/dL — AB (ref 65–99)
GLUCOSE-CAPILLARY: 201 mg/dL — AB (ref 65–99)
Glucose-Capillary: 175 mg/dL — ABNORMAL HIGH (ref 65–99)
Glucose-Capillary: 196 mg/dL — ABNORMAL HIGH (ref 65–99)

## 2016-09-01 LAB — VANCOMYCIN, TROUGH: VANCOMYCIN TR: 13 ug/mL — AB (ref 15–20)

## 2016-09-01 MED ORDER — FUROSEMIDE 10 MG/ML IJ SOLN
40.0000 mg | Freq: Once | INTRAMUSCULAR | Status: AC
  Administered 2016-09-01: 40 mg via INTRAVENOUS
  Filled 2016-09-01: qty 4

## 2016-09-01 MED ORDER — FUROSEMIDE 10 MG/ML IJ SOLN
40.0000 mg | Freq: Two times a day (BID) | INTRAMUSCULAR | Status: DC
  Administered 2016-09-01 – 2016-09-05 (×8): 40 mg via INTRAVENOUS
  Filled 2016-09-01 (×8): qty 4

## 2016-09-01 MED ORDER — VANCOMYCIN HCL IN DEXTROSE 1-5 GM/200ML-% IV SOLN
1000.0000 mg | Freq: Two times a day (BID) | INTRAVENOUS | Status: DC
  Administered 2016-09-01 – 2016-09-02 (×3): 1000 mg via INTRAVENOUS
  Filled 2016-09-01 (×4): qty 200

## 2016-09-01 NOTE — Progress Notes (Signed)
PROGRESS NOTE  [name] [id number] DOB: 15-Aug-1940 DOA: 08/28/2016 PCP: [name], MD   LOS: 3 days   Brief Narrative: In brief, complex vascular patient with history of PAD, DM, HTN, prior CVA with residual right sided weakness admitted with sepsis due to pneumonia and bacteremia  Assessment & Plan: Principal Problem:   Acute encephalopathy Active Problems:   DM2 (diabetes mellitus, type 2) ([place])   Atherosclerosis of native arteries of the extremities with ulceration ([place])   Essential hypertension   Ulcer of right lower extremity ([place])   Sepsis ([place])   Bilateral pneumonia   Bacteremia due to group B Streptococcus   Sepsis due to Group B strep bacteremia - Blood cultures positive for group B strep, final results are pending, continue vancomycin and Levaquin for today. She is penicillin allergic. - Surveillance cultures obtained 1/26, no growth to date  Acute hypoxic respiratory failure due to HCAP and Pulmonary edema - Likely due to fluid resuscitation in the setting of sepsis. Chest x-ray showed slight pulmonary vascular congestion versus worsening of her pneumonia.  - continue IV Lasix today. - required BiPAP overnight - will repeat echo to assess for changes  Acute on chronic diastolic CHF - Most recent 2-D echo was done roughly 6 months ago on July 2017 and showed an ejection fraction of 55-60% as well as grade 2 diastolic dysfunction. Slight fluid overload in the setting of fluid resuscitation due to sepsis - repeat 2d echo  HCAP - probable strep pneumonia, awaiting final blood cultures. With hypoxia now due to fluid  Acute encephalopathy - slowly resolving, she is much more alert today, this is likely due to #1 and #2   Hypertension - resume Coreg, Norvasc   Hyperlipidemia - continue statin  Type 2 diabetes mellitus - continue Lantus and sliding scale  Right lower extremity ulcer - wound care consulted   Peripheral artery disease - Resume Plavix     DVT prophylaxis: Lovenox Code Status: Full code Family Communication: family bedside Disposition Plan: home when ready. PT eval pending  Consultants:   None   Procedures:   None   Antimicrobials:  Vancomycin 1/25 >>  Levaquin 1/25 >>   Subjective: - on Bipap. No dyspnea  Objective: Vitals:   09/01/16 0810 09/01/16 0819 09/01/16 0910 09/01/16 1010  BP: (!) 131/52  (!) 168/96 (!) 169/55  Pulse: 91  97 96  Resp: (!) 22  (!) 26 (!) 25  Temp:      TempSrc:      SpO2: 100% 100% 100% 100%  Weight:      Height:        Intake/Output Summary (Last 24 hours) at 09/01/16 1032 Last data filed at 08/31/16 1751  Gross per 24 hour  Intake               10 ml  Output                0 ml  Net               10 ml   Filed Weights   08/29/16 0005 08/30/16 0430 08/31/16 0340  Weight: 96.6 kg (213 lb) 107.1 kg (236 lb 3.2 oz) 104.8 kg (231 lb 1.6 oz)    Examination: Constitutional: on Bipap Vitals:   09/01/16 0810 09/01/16 0819 09/01/16 0910 09/01/16 1010  BP: (!) 131/52  (!) 168/96 (!) 169/55  Pulse: 91  97 96  Resp: (!) 22  (!) 26 (!) 25  Temp:      TempSrc:      SpO2: 100% 100% 100% 100%  Weight:      Height:       Eyes: PERRL, lids and conjunctivae normal Respiratory: clear to auscultation bilaterally, no wheezing, no crackles. Normal respiratory effort Cardiovascular: Regular rate and rhythm, no murmurs / rubs / gallops. No LE edema.  Abdomen: no tenderness. Bowel sounds positive.  Musculoskeletal: no clubbing / cyanosis. No joint deformity upper and lower extremities.  Skin: no rashes. RLE anterior shin wounds, no drainage, no surrounding cellulitis. Neurologic: CN 2-12 grossly intact. Strength 5/5 in all 4.  Psychiatric: Normal judgment and insight. Alert and oriented x 3    Data Reviewed: I have personally reviewed following labs and imaging studies  CBC:  Recent Labs Lab 08/29/16 0120 08/31/16 0510 09/01/16 0415  WBC 13.8* 13.6* 14.3*  NEUTROABS  12.7*  --   --   HGB 11.3* 9.2* 8.8*  HCT 35.3* 28.8* 28.0*  MCV 81.1 80.9 81.6  PLT 163 126* [id number]*   Basic Metabolic Panel:  Recent Labs Lab 08/29/16 0120 08/31/16 0510 09/01/16 0415  NA 135 140 141  K 3.5 3.3* 4.0  CL 99* 108 107  CO2 22 25 28   GLUCOSE 263* 218* 217*  BUN 21* 11 6  CREATININE 0.95 0.77 0.68  CALCIUM 9.1 9.0 9.2   GFR: Estimated Creatinine Clearance: 73.9 mL/min (by C-G formula based on SCr of 0.68 mg/dL). Liver Function Tests:  Recent Labs Lab 08/29/16 0120  AST 26  ALT 27  ALKPHOS 69  BILITOT 0.8  PROT 6.7  ALBUMIN 3.4*   No results for input(s): LIPASE, AMYLASE in the last 168 hours.  Recent Labs Lab 08/29/16 0556  AMMONIA 34   Coagulation Profile: No results for input(s): INR, PROTIME in the last 168 hours. Cardiac Enzymes: No results for input(s): CKTOTAL, CKMB, CKMBINDEX, TROPONINI in the last 168 hours. BNP (last 3 results) No results for input(s): PROBNP in the last 8760 hours. HbA1C: No results for input(s): HGBA1C in the last 72 hours. CBG:  Recent Labs Lab 08/31/16 1611 08/31/16 2104 09/01/16 0001 09/01/16 0424 09/01/16 0752  GLUCAP 245* 289* 260* 210* 175*   Lipid Profile: No results for input(s): CHOL, HDL, LDLCALC, TRIG, CHOLHDL, LDLDIRECT in the last 72 hours. Thyroid Function Tests: No results for input(s): TSH, T4TOTAL, FREET4, T3FREE, THYROIDAB in the last 72 hours. Anemia Panel: No results for input(s): VITAMINB12, FOLATE, FERRITIN, TIBC, IRON, RETICCTPCT in the last 72 hours. Urine analysis:    Component Value Date/Time   COLORURINE YELLOW 08/29/2016 1815   APPEARANCEUR CLOUDY (A) 08/29/2016 1815   LABSPEC 1.015 08/29/2016 1815   PHURINE 5.0 08/29/2016 1815   GLUCOSEU NEGATIVE 08/29/2016 1815   HGBUR SMALL (A) 08/29/2016 1815   BILIRUBINUR NEGATIVE 08/29/2016 1815   KETONESUR NEGATIVE 08/29/2016 1815   PROTEINUR NEGATIVE 08/29/2016 1815   UROBILINOGEN 0.2 02/12/2014 0110   NITRITE NEGATIVE 08/29/2016  1815   LEUKOCYTESUR LARGE (A) 08/29/2016 1815   Sepsis Labs: Invalid input(s): PROCALCITONIN, LACTICIDVEN  Recent Results (from the past 240 hour(s))  Blood Culture (routine x 2)     Status: Abnormal   Collection Time: 08/29/16  1:27 AM  Result Value Ref Range Status   Specimen Description BLOOD LEFT ARM  Final   Special Requests BLOOD 5CC  Final   Culture  Setup Time   Final    GRAM POSITIVE COCCI IN CHAINS IN BOTH AEROBIC AND ANAEROBIC BOTTLES CRITICAL RESULT CALLED TO, READ  BACK BY AND VERIFIED WITH: J. Arminger Pharm.D. 15:40 08/29/16  ([email or web address])    Culture GROUP B STREP(S.AGALACTIAE)ISOLATED (A)  Final   Report Status 08/31/2016 FINAL  Final   Organism ID, Bacteria GROUP B STREP(S.AGALACTIAE)ISOLATED  Final      Susceptibility   Group b strep(s.agalactiae)isolated - MIC*    CLINDAMYCIN >=1 RESISTANT Resistant     AMPICILLIN <=0.25 SENSITIVE Sensitive     ERYTHROMYCIN >=8 RESISTANT Resistant     VANCOMYCIN 0.5 SENSITIVE Sensitive     CEFTRIAXONE <=0.12 SENSITIVE Sensitive     LEVOFLOXACIN 1 SENSITIVE Sensitive     * GROUP B STREP(S.AGALACTIAE)ISOLATED  Blood Culture ID Panel (Reflexed)     Status: Abnormal   Collection Time: 08/29/16  1:27 AM  Result Value Ref Range Status   Enterococcus species NOT DETECTED NOT DETECTED Final   Listeria monocytogenes NOT DETECTED NOT DETECTED Final   Staphylococcus species NOT DETECTED NOT DETECTED Final   Staphylococcus aureus NOT DETECTED NOT DETECTED Final   Streptococcus species DETECTED (A) NOT DETECTED Final    Comment: CRITICAL RESULT CALLED TO, READ BACK BY AND VERIFIED WITH: J. Arminger Pharm.D. 15:40 08/29/16 ([email or web address])    Streptococcus agalactiae DETECTED (A) NOT DETECTED Final    Comment: CRITICAL RESULT CALLED TO, READ BACK BY AND VERIFIED WITH: J. Arminger Pharm.D. 15:40 08/29/16 ([email or web address])    Streptococcus pneumoniae NOT DETECTED NOT DETECTED Final   Streptococcus pyogenes NOT DETECTED NOT DETECTED Final   Acinetobacter  baumannii NOT DETECTED NOT DETECTED Final   Enterobacteriaceae species NOT DETECTED NOT DETECTED Final   Enterobacter cloacae complex NOT DETECTED NOT DETECTED Final   Escherichia coli NOT DETECTED NOT DETECTED Final   Klebsiella oxytoca NOT DETECTED NOT DETECTED Final   Klebsiella pneumoniae NOT DETECTED NOT DETECTED Final   Proteus species NOT DETECTED NOT DETECTED Final   Serratia marcescens NOT DETECTED NOT DETECTED Final   Haemophilus influenzae NOT DETECTED NOT DETECTED Final   Neisseria meningitidis NOT DETECTED NOT DETECTED Final   Pseudomonas aeruginosa NOT DETECTED NOT DETECTED Final   Candida albicans NOT DETECTED NOT DETECTED Final   Candida glabrata NOT DETECTED NOT DETECTED Final   Candida krusei NOT DETECTED NOT DETECTED Final   Candida parapsilosis NOT DETECTED NOT DETECTED Final   Candida tropicalis NOT DETECTED NOT DETECTED Final  Blood Culture (routine x 2)     Status: Abnormal   Collection Time: 08/29/16  3:18 AM  Result Value Ref Range Status   Specimen Description BLOOD LEFT ARM  Final   Special Requests IN PEDIATRIC BOTTLE 2ML  Final   Culture  Setup Time   Final    GRAM POSITIVE COCCI IN CHAINS IN PEDIATRIC BOTTLE CRITICAL VALUE NOTED.  VALUE IS CONSISTENT WITH PREVIOUSLY REPORTED AND CALLED VALUE.    Culture (A)  Final    GROUP B STREP(S.AGALACTIAE)ISOLATED SUSCEPTIBILITIES PERFORMED ON PREVIOUS CULTURE WITHIN THE LAST 5 DAYS.    Report Status 08/31/2016 FINAL  Final  Urine culture     Status: None   Collection Time: 08/29/16  6:15 PM  Result Value Ref Range Status   Specimen Description URINE, CATHETERIZED  Final   Special Requests NONE  Final   Culture NO GROWTH  Final   Report Status 08/30/2016 FINAL  Final  MRSA PCR Screening     Status: None   Collection Time: 08/29/16  8:06 PM  Result Value Ref Range Status   MRSA by PCR NEGATIVE NEGATIVE Final    Comment:  The GeneXpert MRSA Assay (FDA approved for NASAL specimens only), is one  component of a comprehensive MRSA colonization surveillance program. It is not intended to diagnose MRSA infection nor to guide or monitor treatment for MRSA infections.   Culture, blood (Routine X 2) w Reflex to ID Panel     Status: None (Preliminary result)   Collection Time: 08/30/16  1:48 PM  Result Value Ref Range Status   Specimen Description BLOOD LEFT ARM  Final   Special Requests IN PEDIATRIC BOTTLE .5CC  Final   Culture NO GROWTH < 24 HOURS  Final   Report Status PENDING  Incomplete  Culture, blood (Routine X 2) w Reflex to ID Panel     Status: None (Preliminary result)   Collection Time: 08/30/16  1:48 PM  Result Value Ref Range Status   Specimen Description BLOOD LEFT ARM  Final   Special Requests IN PEDIATRIC BOTTLE 1CC  Final   Culture NO GROWTH < 24 HOURS  Final   Report Status PENDING  Incomplete      Radiology Studies: Dg Chest Port 1 View  Result Date: 08/31/2016 CLINICAL DATA:  Dyspnea.  History of pneumonia. EXAM: PORTABLE CHEST 1 VIEW COMPARISON:  08/30/2016 FINDINGS: The cardiac silhouette is enlarged. Mediastinal contours appear intact. Stable appearance of bilateral perihilar and lower lobe predominant airspace opacities. Mild increase of the interstitial markings. Probable bilateral pleural effusions. Osseous structures are without acute abnormality. Soft tissues are grossly normal. IMPRESSION: Cardiomegaly with interstitial pulmonary edema. Bilateral pleural effusions with lower lobe atelectasis versus airspace consolidation. Electronically Signed   By: Fidela Salisbury M.D.   On: 08/31/2016 16:14   Dg Chest Port 1 View  Result Date: 08/30/2016 CLINICAL DATA:  Dyspnea. EXAM: PORTABLE CHEST 1 VIEW COMPARISON:  08/29/2016 and 07/05/2016 FINDINGS: Lungs are somewhat hypoinflated with hazy perihilar and bibasilar opacification slightly worse. Findings may be due to edema versus infection. Cannot exclude a small amount of pleural fluid. Moderate stable  cardiomegaly. Remainder the exam is unchanged. IMPRESSION: Slight worsening bilateral perihilar bibasilar opacification which may be due to edema versus infection. Possible small amount of pleural fluid. Stable cardiomegaly. Electronically Signed   By: Marin Olp M.D.   On: 08/30/2016 19:26   Dg Swallowing Func-speech Pathology  Result Date: 08/30/2016 Objective Swallowing Evaluation: Type of Study: MBS-Modified Barium Swallow Study Patient Details Name: [name] MRN: [id number] Date of Birth: 09/09/1940 Today's Date: 08/30/2016 Time: SLP Start Time (ACUTE ONLY): 1055-SLP Stop Time (ACUTE ONLY): 1112 SLP Time Calculation (min) (ACUTE ONLY): 17 min Past Medical History: Past Medical History: Diagnosis Date . Acute heart failure ([place])  . Aortic stenosis  . Complication of anesthesia   Hard to wake patient up after having anesthesia . Diabetes mellitus without complication ([place])  . Diabetic neuropathy ([facility])   Takes Lyrica . Edema of both legs   Takes Lasix . GERD (gastroesophageal reflux disease)  . High cholesterol  . HTN (hypertension)  . Hypertension  . PAD (peripheral artery disease) ([place])  . Shortness of breath dyspnea  . Spasm of back muscles  . Stroke ([place])  . Wound, open   Right great toe Past Surgical History: Past Surgical History: Procedure Laterality Date . BYPASS GRAFT POPLITEAL TO TIBIAL Right 02/28/2016  Procedure: BYPASS GRAFT RIGHT BELOW KNEE POPLITEAL TO PERONEAL USING REVERSED RIGHT GREATER SAPHENOUS VEIN;  Surgeon: Conrad [place], MD;  Location: [facility];  Service: Vascular;  Laterality: Right; . CYST EXCISION    Abdomen . EYE SURGERY Bilateral  Cataract removal . INTRAMEDULLARY (IM) NAIL INTERTROCHANTERIC Left 02/04/2014  Procedure: INTRAMEDULLARY (IM) NAIL INTERTROCHANTRIC FEMORAL;  Surgeon: Mauri Pole, MD;  Location: [facility];  Service: Orthopedics;  Laterality: Left; . IR GENERIC HISTORICAL  03/01/2016  IR ANGIO INTRA EXTRACRAN SEL COM CAROTID INNOMINATE UNI R MOD SED 03/01/2016 [name], MD [facility] . IR GENERIC HISTORICAL  03/01/2016  IR ENDOVASC INTRACRANIAL INF OTHER THAN THROMBO ART INC DIAG ANGIO 03/01/2016 [name], MD [facility] . IR GENERIC HISTORICAL  03/01/2016  IR INTRAVSC STENT CERV CAROTID W/O EMB-PROT MOD SED INC ANGIO 03/01/2016 [name], MD [facility] . IR GENERIC HISTORICAL  06/03/2016  IR RADIOLOGIST EVAL & MGMT 06/03/2016 [facility] . ORIF TOE FRACTURE Right 02/08/2014  Procedure: OPEN REDUCTION INTERNAL FIXATION Right METATARSAL  FRACTURE ;  Surgeon: Wylene Simmer, MD;  Location: [facility];  Service: Orthopedics;  Laterality: Right; . PERIPHERAL VASCULAR CATHETERIZATION N/A 12/28/2015  Procedure: Abdominal Aortogram w/Lower Extremity;  Surgeon: Conrad Avalon, MD;  Location: [facility];  Service: Cardiovascular;  Laterality: N/A; . RADIOLOGY WITH ANESTHESIA N/A 03/01/2016  Procedure: RADIOLOGY WITH ANESTHESIA;  Surgeon: [name], MD;  Location: [facility];  Service: Radiology;  Laterality: N/A; . VEIN HARVEST Right 02/28/2016  Procedure: RIGHT GREATER SAPHENOUS VEIN HARVEST;  Surgeon: Conrad [place], MD;  Location: [facility];  Service: Vascular;  Laterality: Right; HPI: Pt is a 76 y.o. female with PMH of DM2 and HTN, presented to ED 1/25 with AMS. Head CT negative, CXR showing patchy R upper/ bibasilar areas of superimposed airspace disease concerning for minimal PNA. Pt was followed by speech therapy in August 2017 following acute stroke; had MBS on 8/8 with recommendations for dysphagia 1/ honey thick liquids by tsp. Bedside swallow eval ordered. No Data Recorded Assessment / Plan / Recommendation CHL IP CLINICAL IMPRESSIONS 08/30/2016 Therapy Diagnosis WFL Clinical Impression Pt currently with oropharyngeal swallow function within functional limits. Pt does have premature spill to the level of the vallecula across consistencies but this did not result in any penetration or aspiration with numerous trials provided. Recommend initiating regular  diet, thin liquids, meds whole with liquid, full supervision to assist with feeding during meals; also follow reflux precautions given hx of GERD. Worth noting that pt is concerned about having "hard foods" after not eating for a couple of days and may have preference for soft solids initially.SLP will sign off at this time; please re-consult if needs arise.  Impact on safety and function Mild aspiration risk   CHL IP TREATMENT RECOMMENDATION 08/30/2016 Treatment Recommendations No treatment recommended at this time           CHL IP DIET RECOMMENDATION 08/30/2016 SLP Diet Recommendations Regular solids;Thin liquid Liquid Administration via Cup;Straw Medication Administration Whole meds with liquid Compensations Slow rate;Small sips/bites Postural Changes Seated upright at 90 degrees;Remain semi-upright after after feeds/meals (Comment)   CHL IP OTHER RECOMMENDATIONS 08/30/2016 Recommended Consults -- Oral Care Recommendations Oral care BID Other Recommendations --   CHL IP FOLLOW UP RECOMMENDATIONS 08/30/2016 Follow up Recommendations None        CHL IP ORAL PHASE 08/30/2016 Oral Phase Impaired Oral - Pudding Teaspoon -- Oral - Pudding Cup -- Oral - Honey Teaspoon -- Oral - Honey Cup -- Oral - Nectar Teaspoon -- Oral - Nectar Cup -- Oral - Nectar Straw -- Oral - Thin Teaspoon -- Oral - Thin Cup Premature spillage Oral - Thin Straw Premature spillage Oral - Puree Premature spillage Oral - Mech Soft --  Oral - Regular Premature spillage Oral - Multi-Consistency -- Oral - Pill -- Oral Phase - Comment --  CHL IP PHARYNGEAL PHASE 08/30/2016 Pharyngeal Phase WFL Pharyngeal- Pudding Teaspoon -- Pharyngeal -- Pharyngeal- Pudding Cup -- Pharyngeal -- Pharyngeal- Honey Teaspoon -- Pharyngeal -- Pharyngeal- Honey Cup -- Pharyngeal -- Pharyngeal- Nectar Teaspoon -- Pharyngeal -- Pharyngeal- Nectar Cup -- Pharyngeal -- Pharyngeal- Nectar Straw -- Pharyngeal -- Pharyngeal- Thin Teaspoon -- Pharyngeal -- Pharyngeal- Thin Cup --  Pharyngeal -- Pharyngeal- Thin Straw -- Pharyngeal -- Pharyngeal- Puree -- Pharyngeal -- Pharyngeal- Mechanical Soft -- Pharyngeal -- Pharyngeal- Regular -- Pharyngeal -- Pharyngeal- Multi-consistency -- Pharyngeal -- Pharyngeal- Pill -- Pharyngeal -- Pharyngeal Comment --  CHL IP CERVICAL ESOPHAGEAL PHASE 08/30/2016 Cervical Esophageal Phase WFL Pudding Teaspoon -- Pudding Cup -- Honey Teaspoon -- Honey Cup -- Nectar Teaspoon -- Nectar Cup -- Nectar Straw -- Thin Teaspoon -- Thin Cup -- Thin Straw -- Puree -- Mechanical Soft -- Regular -- Multi-consistency -- Pill -- Cervical Esophageal Comment -- No flowsheet data found. Kern Reap, MA, CCC-SLP 08/30/2016, 11:18 AM [phone]               Scheduled Meds: . amLODipine  10 mg Oral Daily  . atorvastatin  20 mg Oral Daily  . carvedilol  12.5 mg Oral BID WC  . clopidogrel  75 mg Oral Q breakfast  . enoxaparin (LOVENOX) injection  40 mg Subcutaneous Daily  . insulin aspart  0-9 Units Subcutaneous Q4H  . insulin glargine  15 Units Subcutaneous QHS  . isosorbide mononitrate  60 mg Oral Daily  . levofloxacin (LEVAQUIN) IV  750 mg Intravenous Q24H  . mupirocin cream   Topical Daily  . ranolazine  500 mg Oral BID  . sodium chloride flush  10-20 mL Intravenous Q12H  . vancomycin  1,000 mg Intravenous Q12H   Continuous Infusions:   Marzetta Board, MD, PhD [facility] Pager [phone]  If 7PM-7AM, please contact night-coverage [email or web address] Password [facility] 09/01/2016, 10:32 AM

## 2016-09-01 NOTE — Progress Notes (Signed)
Pharmacy Antibiotic Note  [name] is a 76 y.o. female admitted on 08/28/2016 with GBS bacteremia and possible PNA.  Pharmacy has been consulted for vancomycin and levofloxacin dosing, as patient has noted PCN allergy. Repeat blood cultures have been no growth. Renal function has remained stable, vancomycin trough this morning was subtherapeutic at 13 (drawn appropriately).   Plan: -Increase vancomycin to 1000mg  IV q12h -Continue levofloxacin 750mg  IV q24h -Monitor length of therapy, cultures, and renal function -Obtain vancomycin trough as indicated  Height: 5\' 4"  (162.6 cm) Weight: 231 lb 1.6 oz (104.8 kg) IBW/kg (Calculated) : 54.7  Temp (24hrs), Avg:98.7 F (37.1 C), Min:98.3 F (36.8 C), Max:99.1 F (37.3 C)   Recent Labs Lab 08/29/16 0120 08/29/16 0133 08/29/16 0602 08/29/16 1038 08/31/16 0510 09/01/16 0415 09/01/16 0610  WBC 13.8*  --   --   --  13.6* 14.3*  --   CREATININE 0.95  --   --   --  0.77 0.68  --   LATICACIDVEN  --  2.77* 5.03* 2.4*  --   --   --   VANCOTROUGH  --   --   --   --   --   --  13*    Estimated Creatinine Clearance: 73.9 mL/min (by C-G formula based on SCr of 0.68 mg/dL).    Allergies  Allergen Reactions  . Iodine Anaphylaxis  . Penicillins Anaphylaxis    Rec'd cefazolin 02/04/14 pre-op for femur ORIF  . Shellfish Allergy Anaphylaxis  . Shellfish-Derived Products Anaphylaxis  . Sulfa Antibiotics Anaphylaxis  . Sulfacetamide Sodium Anaphylaxis  . Sulfasalazine Anaphylaxis  . Atorvastatin     Other reaction(s): Unknown  . Eggs Or Egg-Derived Products     Other reaction(s): SHORTNESS OF BREATH    Antimicrobials this admission: 1/25 Vanc >>  1/25 Aztreonam >> 1/26 1/25 Levaquin >>  Dose adjustments this admission: 1/28 VT = 13 > increase vancomycin to 1000mg  q12h  Microbiology results: 1/25 BCx x2: 1/2 GBS (confirmed by BCID) 1/25 UCx: no growth 1/26 BCx: NGTD   Thank you for allowing pharmacy to be a part of this patient's  care.  Arrie Senate, PharmD PGY-1 Pharmacy Resident Pager: [phone] 09/01/2016

## 2016-09-01 NOTE — [place] FL2 (Signed)
[place] MEDICAID FL2 LEVEL OF CARE SCREENING TOOL     IDENTIFICATION  Patient Name: [name] Birthdate: 05-30-1941 Sex: female Admission Date (Current Location): 08/28/2016  [facility] and [place] Number:  Herbalist and Address:  [facility], [place] [address] [place], [place] [address]      Provider Number: [id number]  Attending Physician Name and Address:  Caren Griffins, MD  Relative Name and Phone Number:  Spouse [phone]    Current Level of Care: Hospital Recommended Level of Care: [place] Prior Approval Number:    Date Approved/Denied:   PASRR Number: [id number]  Discharge Plan: SNF    Current Diagnoses: Patient Active Problem List   Diagnosis Date Noted  . Bacteremia due to group B Streptococcus 08/31/2016  . Sepsis ([place]) 08/29/2016  . Bilateral pneumonia 08/29/2016  . Essential hypertension   . Acute on chronic diastolic heart failure ([place])   . Ulcer of right lower extremity ([place])   . Chest pain 07/05/2016  . Pleural effusion, bilateral   . Pulmonary congestion   . Wheeze   . Dyspnea   . CHF (congestive heart failure) ([place])   . Right sided weakness   . Acute respiratory acidosis   . Surgery, elective   . Anemia, iron deficiency   . Leukocytosis   . Cerebrovascular accident (CVA) due to thrombosis of precerebral artery ([place]) 03/01/2016  . 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 Acute respiratory failure ([place])   . PAD (peripheral artery disease) ([place]) 02/28/2016  . Atherosclerosis of native arteries of the extremities with ulceration ([place]) 12/08/2015  . Open toe wound 05/04/2014  . Anemia, chronic disease 04/27/2014  . Occult blood positive stool 04/17/2014  . Edema 04/03/2014  . Unspecified hereditary and idiopathic peripheral neuropathy 03/14/2014  . Acute and chronic respiratory failure 02/14/2014  . DM2 (diabetes mellitus, type 2) ([place]) 02/14/2014   . Respiratory failure ([place]) 02/12/2014  . Altered mental status 02/12/2014  . Acute encephalopathy 02/12/2014  . Acute respiratory failure with hypercapnia ([place]) 02/12/2014  . Aortic stenosis 02/04/2014  . Closed left subtrochanteric femur fracture ([place]) 02/03/2014  . Hip fracture, left ([place]) 02/03/2014  . Hypokalemia 02/03/2014  . Fibula fracture 02/03/2014  . MTP instability 02/03/2014  . Hip fracture ([place]) 02/03/2014  . HTN (hypertension)   . Type 2 diabetes mellitus without complication, without long-term current use of insulin ([place])   . HLD (hyperlipidemia)     Orientation RESPIRATION BLADDER Height & Weight     Self, Time, Situation, Place  Normal Continent Weight: 231 lb 1.6 oz (104.8 kg) Height:  5\' 4"  (162.6 cm)  BEHAVIORAL SYMPTOMS/MOOD NEUROLOGICAL BOWEL NUTRITION STATUS      Continent Diet (See DC Summary)  AMBULATORY STATUS COMMUNICATION OF NEEDS Skin   Limited Assist Verbally Normal                       Personal Care Assistance Level of Assistance  Bathing, Dressing Bathing Assistance: Limited assistance   Dressing Assistance: Limited assistance     Functional Limitations Info  Sight, Hearing, Speech Sight Info: Adequate Hearing Info: Adequate Speech Info: Adequate    SPECIAL CARE FACTORS FREQUENCY  PT (By licensed PT), OT (By licensed OT)     PT Frequency: 5x wk OT Frequency: 5x wk            Contractures Contractures Info: Not present    Additional  Factors Info  Code Status, Allergies Code Status Info: full (Full) Allergies Info:  Iodine, Penicillins, Shellfish Allergy, Shellfish-derived Products, Sulfa Antibiotics, Sulfacetamide Sodium, Sulfasalazine, Atorvastatin           Current Medications (09/01/2016):  This is the current hospital active medication list Current Facility-Administered Medications  Medication Dose Route Frequency Provider Last Rate Last Dose  . acetaminophen (TYLENOL) suppository 650 mg  650 mg Rectal Q4H PRN  Caren Griffins, MD   650 mg at 08/29/16 1400  . acetaminophen (TYLENOL) tablet 650 mg  650 mg Oral Q6H PRN Caren Griffins, MD   650 mg at 08/31/16 0522  . albuterol (PROVENTIL) (2.5 MG/3ML) 0.083% nebulizer solution 2.5 mg  2.5 mg Inhalation Q6H PRN Etta Quill, DO   2.5 mg at 08/31/16 1953  . amLODipine (NORVASC) tablet 10 mg  10 mg Oral Daily Costin Karlyne Greenspan, MD   10 mg at 09/01/16 1423  . atorvastatin (LIPITOR) tablet 20 mg  20 mg Oral Daily Costin Karlyne Greenspan, MD   20 mg at 09/01/16 1422  . carvedilol (COREG) tablet 12.5 mg  12.5 mg Oral BID WC Costin Karlyne Greenspan, MD   12.5 mg at 09/01/16 1425  . clopidogrel (PLAVIX) tablet 75 mg  75 mg Oral Q breakfast Caren Griffins, MD   75 mg at 09/01/16 1422  . enoxaparin (LOVENOX) injection 40 mg  40 mg Subcutaneous Daily Etta Quill, DO   40 mg at 09/01/16 1421  . furosemide (LASIX) injection 40 mg  40 mg Intravenous Q12H Costin Karlyne Greenspan, MD      . hydrALAZINE (APRESOLINE) injection 10-20 mg  10-20 mg Intravenous Q4H PRN Etta Quill, DO   20 mg at 08/30/16 1820  . insulin aspart (novoLOG) injection 0-9 Units  0-9 Units Subcutaneous Q4H Etta Quill, DO   2 Units at 09/01/16 1200  . insulin glargine (LANTUS) injection 15 Units  15 Units Subcutaneous QHS Etta Quill, DO   15 Units at 09/01/16 0002  . isosorbide mononitrate (IMDUR) 24 hr tablet 60 mg  60 mg Oral Daily Costin Karlyne Greenspan, MD   60 mg at 09/01/16 1423  . levofloxacin (LEVAQUIN) IVPB 750 mg  750 mg Intravenous Q24H Franky Macho, RPH   750 mg at 09/01/16 [id number]  . LORazepam (ATIVAN) injection 0.5 mg  0.5 mg Intravenous Q4H PRN Caren Griffins, MD   0.5 mg at 09/01/16 0946  . mupirocin cream (BACTROBAN) 2 %   Topical Daily Costin Karlyne Greenspan, MD      . polyethylene glycol (MIRALAX / GLYCOLAX) packet 17 g  17 g Oral Daily PRN Etta Quill, DO      . ranolazine (RANEXA) 12 hr tablet 500 mg  500 mg Oral BID Caren Griffins, MD   500 mg at 09/01/16 1422  . sodium chloride flush  (NS) 0.9 % injection 10-20 mL  10-20 mL Intravenous Q12H Costin Karlyne Greenspan, MD   20 mL at 09/01/16 1000  . sodium chloride flush (NS) 0.9 % injection 10-20 mL  10-20 mL Intravenous PRN Costin Karlyne Greenspan, MD      . vancomycin (VANCOCIN) IVPB 1000 mg/200 mL premix  1,000 mg Intravenous Q12H Einar Grad, RPH   1,000 mg at 09/01/16 [id number]     Discharge Medications: Please see discharge summary for a list of discharge medications.  Relevant Imaging Results:  Relevant Lab Results:   Additional Information    Misaki Sozio B, LCSWA

## 2016-09-02 ENCOUNTER — Inpatient Hospital Stay (HOSPITAL_COMMUNITY): Payer: Medicare HMO

## 2016-09-02 DIAGNOSIS — I509 Heart failure, unspecified: Secondary | ICD-10-CM

## 2016-09-02 LAB — CBC
HCT: 26.9 % — ABNORMAL LOW (ref 36.0–46.0)
HEMOGLOBIN: 8.2 g/dL — AB (ref 12.0–15.0)
MCH: 25.5 pg — ABNORMAL LOW (ref 26.0–34.0)
MCHC: 30.5 g/dL (ref 30.0–36.0)
MCV: 83.8 fL (ref 78.0–100.0)
Platelets: 125 10*3/uL — ABNORMAL LOW (ref 150–400)
RBC: 3.21 MIL/uL — AB (ref 3.87–5.11)
RDW: 16.6 % — ABNORMAL HIGH (ref 11.5–15.5)
WBC: 9 10*3/uL (ref 4.0–10.5)

## 2016-09-02 LAB — BASIC METABOLIC PANEL
Anion gap: 6 (ref 5–15)
BUN: 8 mg/dL (ref 6–20)
CHLORIDE: 104 mmol/L (ref 101–111)
CO2: 31 mmol/L (ref 22–32)
CREATININE: 0.7 mg/dL (ref 0.44–1.00)
Calcium: 9.2 mg/dL (ref 8.9–10.3)
GFR calc non Af Amer: >60 mL/min (ref >60)
Glucose, Bld: 188 mg/dL — ABNORMAL HIGH (ref 65–99)
POTASSIUM: 3.9 mmol/L (ref 3.5–5.1)
SODIUM: 141 mmol/L (ref 135–145)

## 2016-09-02 LAB — ECHOCARDIOGRAM COMPLETE
Height: 64 in
Weight: 3587.2 oz

## 2016-09-02 LAB — GLUCOSE, CAPILLARY
GLUCOSE-CAPILLARY: 169 mg/dL — AB (ref 65–99)
GLUCOSE-CAPILLARY: 194 mg/dL — AB (ref 65–99)
GLUCOSE-CAPILLARY: 229 mg/dL — AB (ref 65–99)
GLUCOSE-CAPILLARY: 308 mg/dL — AB (ref 65–99)
Glucose-Capillary: 170 mg/dL — ABNORMAL HIGH (ref 65–99)

## 2016-09-02 MED ORDER — GLUCERNA SHAKE PO LIQD
237.0000 mL | Freq: Three times a day (TID) | ORAL | Status: DC
  Administered 2016-09-02 – 2016-09-03 (×2): 237 mL via ORAL
  Administered 2016-09-03: 237 via ORAL
  Administered 2016-09-03 – 2016-09-17 (×27): 237 mL via ORAL

## 2016-09-02 NOTE — Care Management Important Message (Signed)
Important Message  Patient Details  Name: [name] MRN: [id number] Date of Birth: 09-06-1940   Medicare Important Message Given:  Yes    Nathen May 09/02/2016, 2:04 PM

## 2016-09-02 NOTE — Progress Notes (Signed)
Inpatient Diabetes Program Recommendations  AACE/ADA: New Consensus Statement on Inpatient Glycemic Control (2015)  Target Ranges:  Prepandial:   less than 140 mg/dL      Peak postprandial:   less than 180 mg/dL (1-2 hours)      Critically ill patients:  140 - 180 mg/dL   Results for MINETTA, KEPHART (MRN [id number]) as of 09/02/2016 11:56  Ref. Range 09/01/2016 00:01 09/01/2016 04:24 09/01/2016 07:52 09/01/2016 11:55 09/01/2016 16:34 09/01/2016 20:38  Glucose-Capillary Latest Ref Range: 65 - 99 mg/dL 260 (H) 210 (H) 175 (H) 187 (H) 201 (H) 196 (H)    Results for TENASHA, EGLE (MRN [id number]) as of 09/02/2016 11:56  Ref. Range 09/02/2016 00:38 09/02/2016 04:14 09/02/2016 08:22 09/02/2016 11:27  Glucose-Capillary Latest Ref Range: 65 - 99 mg/dL 169 (H) 194 (H) 170 (H) 308 (H)    Admit AMS/ Pneumonia/ Sepsis.   History: DM, CVA  Home DM Meds: Lantus 30 units QHS        Novolog 12 units TID  Current Orders: Lantus 15 units QHS      Novolog Sensitive Correction Scale/ SSI (0-9 units) Q4 hours      MD- Please consider the following in-hospital insulin adjustments:  1. Increase Lantus to 20 units QHS (home dose of Lantus is 30 units QHS)  2. Change Novolog Sensitive Correction Scale/ SSI (0-9 units) to TID AC + HS (currently ordered Q4 hours)  3. Start Novolog Meal Coverage: Novolog 6 units TID with meals (50% home dose) (hold if pt eats <50% of meal)     --Will follow patient during hospitalization--  Wyn Quaker RN, MSN, CDE Diabetes Coordinator Inpatient Glycemic Control Team Team Pager: [phone] (8a-5p)

## 2016-09-02 NOTE — Care Management Note (Addendum)
Case Management Note  Patient Details  Name: [name] MRN: [id number] Date of Birth: 1941-03-01  Subjective/Objective: Pt presented for Acute Encephalopathy. Pt with hx of prior CVA with residual right sided weakness presented with sepsis due to pneumonia and bacteremia initiated on IV Levaquin.                     Action/Plan: CSW did reach out to family in regards to disposition needs. Pt has been at a facility in the past. Husband needed time to discuss with family in regards to disposition post hospital. CM will continue to monitor.   Expected Discharge Date:                  Expected Discharge Plan:  [place]  In-House Referral:  Clinical Social Work  Discharge planning Services  CM Consult  Post Acute Care Choice:   N/A Choice offered to:   N/A  DME Arranged:   N/A DME Agency:   N/A  HH Arranged:   N/A HH Agency:   N/A  Status of Service: Completed If discussed at [place] of Stay Meetings, dates discussed:  09-10-16, 09-17-16  Additional Comments: [id number] Jacqlyn Krauss, RN,BSN [phone] Plan will be for d/c to SNF today to [place] is aware. No further needs from CM at this time.   09-12-16 [address], [place] [phone] CM did receive call from [place] in regards to [facility] had called them with SNF authorization/ Auth # [id number] for 3 days of SNF-total of 543 therapy hours approved. CM did relay information to [place]. CM will continue to monitor.     09-10-16 [address], [place] [phone] CM did speak with pt and her husband in regards to disposition needs on 09-09-16. Husband stated pt will not be able to return home in her condition. Husband feels patient needs SNF. CM did speak with pt/ husband in regards to her Medicare Days. CM did ask if pt would be able to pay out of pocket. Husband wanted CSW to speak with him and follow up with Medicare before he answers that question. CM  did relay information to CSW in regards to family still wants SNF once medically stable. CM will continue to monitor.   09-06-16 [address] [place] [phone] Per SW pt is out of Medicare days. Pt continues on IV Lasix. Pt has caregivers at home. Plan will have to be home with [facility] once stable for d/c. Husband to make a decision 09-07-16 for [facility]. CM will continue to monitor.  Bethena Roys, RN 09/02/2016, 4:27 PM

## 2016-09-02 NOTE — Progress Notes (Signed)
  Echocardiogram 2D Echocardiogram has been performed.  [name] 09/02/2016, 9:31 AM

## 2016-09-02 NOTE — Progress Notes (Signed)
Initial Nutrition Assessment  DOCUMENTATION CODES:   Obesity unspecified  INTERVENTION:    Glucerna Shake po TID, each supplement provides 220 kcal and 10 grams of protein  NUTRITION DIAGNOSIS:   Inadequate oral intake related to lethargy/confusion as evidenced by meal completion < 50%.  GOAL:   Patient will meet greater than or equal to 90% of their needs  MONITOR:   PO intake, Supplement acceptance, I & O's, Skin  REASON FOR ASSESSMENT:   Low [place]    ASSESSMENT:   76 y.o. female admitted on 08/28/2016 with GBS bacteremia and possible PNA.   Patient was very sleepy during RD visit. She was unable to provide much nutrition hx.  Nutrition-Focused physical exam completed. Findings are no fat depletion, no muscle depletion, and mild edema.  Meal tray in room, patient consumed < 25%. Labs and medications reviewed.  Diet Order:  Diet heart healthy/carb modified Room service appropriate? Yes; Fluid consistency: Thin  Skin:  Wound (see comment) (DTI to heel)  Last BM:  1/25  Height:   Ht Readings from Last 1 Encounters:  08/29/16 5\' 4"  (1.626 m)    Weight:   Wt Readings from Last 1 Encounters:  09/02/16 224 lb 3.2 oz (101.7 kg)    Ideal Body Weight:  54.5 kg  BMI:  Body mass index is 38.48 kg/m.  Estimated Nutritional Needs:   Kcal:  1600-1800  Protein:  >/= 100 gm  Fluid:  2 L  EDUCATION NEEDS:   No education needs identified at this time  Molli Barrows, [place], [place], [place] Pager [phone] After Hours Pager [phone]

## 2016-09-02 NOTE — Clinical Social Work Note (Signed)
Clinical Social Work Assessment  Patient Details  Name: [name] MRN: [id number] Date of Birth: Jan 01, 1941  Date of referral:  09/02/16               Reason for consult:  Facility Placement                Permission sought to share information with:  Family Supports Permission granted to share information::  Yes, Verbal Permission Granted  Name::     Calaya Schab  Relationship::  Spouse  Contact Information:  [phone]  Housing/Transportation Living arrangements for the past 2 months:  Single Family Home Source of Information:  Spouse Patient Interpreter Needed:  None Criminal Activity/Legal Involvement Pertinent to Current Situation/Hospitalization:  No - Comment as needed Significant Relationships:  Adult Children, Spouse Lives with:  Spouse Do you feel safe going back to the place where you live?  Yes Need for family participation in patient care:  Yes (Comment)  Care giving concerns:  Patient husband feels that if gives permission for SNF search too soon, patient will get pushed out of the hospital.  CSW encouraged to initiate search as a preventative approach for when ready for discharge.   Social Worker assessment / plan:  Holiday representative spoke with patient husband over the phone to offer support and discuss patient needs at discharge.  Patient husband states that patient lives at home with him and would like for her to return if possible.  Patient has been to [facility] in the past and patient husband is open to the idea of her returning if needed.  Patient husband plans to speak with children to make a solidified plan at discharge and requests follow up tomorrow.  CSW initiated SNF search and will follow up with patient husband for bed offers.  CSW remains available for support and to facilitate patient discharge needs.  Employment status:    Insurance information:  Water engineer) PT Recommendations:  24 Hour Supervision, [place] / Referral to community resources:  [place]  Patient/Family's Response to care:  Patient family understanding of CSW role and appreciative of support and concern.  Patient family agreeable to the possibility of SNF placement but not yet ready to commit.   Patient/Family's Understanding of and Emotional Response to Diagnosis, Current Treatment, and Prognosis:  Patient family not overly engaged over the phone, therefore hard to determine their understanding and response to patient current medical status.  Emotional Assessment Appearance:  Appears older than stated age Attitude/Demeanor/Rapport:  Unable to Assess Affect (typically observed):  Unable to Assess Orientation:  Oriented to Self Alcohol / Substance use:  Not Applicable Psych involvement (Current and /or in the community):  No (Comment)  Discharge Needs  Concerns to be addressed:  Discharge Planning Concerns Readmission within the last 30 days:  No Current discharge risk:  Dependent with Mobility, Cognitively Impaired Barriers to Discharge:  Continued Medical Work up  [employer], [place]

## 2016-09-02 NOTE — Progress Notes (Signed)
PROGRESS NOTE  [name] [id number] DOB: 1941/02/23 DOA: 08/28/2016 PCP: [name], MD   LOS: 4 days   Brief Narrative: In brief, complex vascular patient with history of PAD, DM, HTN, prior CVA with residual right sided weakness admitted with sepsis due to pneumonia and bacteremia  Assessment & Plan: Principal Problem:   Acute encephalopathy Active Problems:   DM2 (diabetes mellitus, type 2) ([place])   Atherosclerosis of native arteries of the extremities with ulceration ([place])   Essential hypertension   Ulcer of right lower extremity ([place])   Sepsis ([place])   Bilateral pneumonia   Bacteremia due to group B Streptococcus   Sepsis due to Group B strep bacteremia - Blood cultures positive for group B strep, final results are pending, continue Levaquin alone as is sensitive . She is penicillin allergic. - Surveillance cultures obtained 1/26, no growth to date  Acute hypoxic respiratory failure due to HCAP and Pulmonary edema - Likely due to fluid resuscitation in the setting of sepsis. Chest x-ray showed slight pulmonary vascular congestion versus worsening of her pneumonia.  - continue IV Lasix today. - will repeat echo to assess for changes  Acute on chronic diastolic CHF - Most recent 2-D echo was done roughly 6 months ago on July 2017 and showed an ejection fraction of 55-60% as well as grade 2 diastolic dysfunction. Slight fluid overload in the setting of fluid resuscitation due to sepsis - repeat 2d echo  HCAP - probable strep pneumonia, awaiting final blood cultures. With hypoxia now due to fluid  Acute encephalopathy - slowly resolving, she is much more alert today, this is likely due to #1 and #2   Hypertension - resume Coreg, Norvasc   Hyperlipidemia - continue statin  Type 2 diabetes mellitus - continue Lantus and sliding scale  Right lower extremity ulcer - wound care consulted   Peripheral artery disease - Resume Plavix    DVT prophylaxis:  Lovenox Code Status: Full code Family Communication: family bedside Disposition Plan: d/c 2-3 days  Consultants:   None   Procedures:   None   Antimicrobials:  Vancomycin 1/25 >> 1/29  Levaquin 1/25 >>   Subjective: - on [place] this morning. No chest pain, mild shortness of breath   Objective: Vitals:   09/02/16 0034 09/02/16 0419 09/02/16 0943 09/02/16 0944  BP: (!) 138/45  (!) 169/58 (!) 169/58  Pulse: 82   82  Resp: (!) 22     Temp: 98.7 F (37.1 C) 98.6 F (37 C)    TempSrc: Oral Oral    SpO2: 100%     Weight:  101.7 kg (224 lb 3.2 oz)    Height:        Intake/Output Summary (Last 24 hours) at 09/02/16 1110 Last data filed at 09/02/16 1000  Gross per 24 hour  Intake           1473.6 ml  Output             1075 ml  Net            398.6 ml   Filed Weights   08/30/16 0430 08/31/16 0340 09/02/16 0419  Weight: 107.1 kg (236 lb 3.2 oz) 104.8 kg (231 lb 1.6 oz) 101.7 kg (224 lb 3.2 oz)    Examination: Constitutional: on Bipap Vitals:   09/02/16 0034 09/02/16 0419 09/02/16 0943 09/02/16 0944  BP: (!) 138/45  (!) 169/58 (!) 169/58  Pulse: 82   82  Resp: (!) 22  Temp: 98.7 F (37.1 C) 98.6 F (37 C)    TempSrc: Oral Oral    SpO2: 100%     Weight:  101.7 kg (224 lb 3.2 oz)    Height:       Eyes: PERRL, lids and conjunctivae normal Respiratory: clear to auscultation bilaterally, no wheezing, no crackles. Normal respiratory effort Cardiovascular: Regular rate and rhythm, no murmurs / rubs / gallops. No LE edema.  Abdomen: no tenderness. Bowel sounds positive.  Musculoskeletal: no clubbing / cyanosis. No joint deformity upper and lower extremities.  Skin: no rashes. RLE anterior shin wounds, no drainage, no surrounding cellulitis. Neurologic: CN 2-12 grossly intact. Strength 5/5 in all 4.  Data Reviewed: I have personally reviewed following labs and imaging studies  CBC:  Recent Labs Lab 08/29/16 0120 08/31/16 0510 09/01/16 0415 09/02/16 0432   WBC 13.8* 13.6* 14.3* 9.0  NEUTROABS 12.7*  --   --   --   HGB 11.3* 9.2* 8.8* 8.2*  HCT 35.3* 28.8* 28.0* 26.9*  MCV 81.1 80.9 81.6 83.8  PLT 163 126* 131* 0000000*   Basic Metabolic Panel:  Recent Labs Lab 08/29/16 0120 08/31/16 0510 09/01/16 0415 09/02/16 0432  NA 135 140 141 141  K 3.5 3.3* 4.0 3.9  CL 99* 108 107 104  CO2 22 25 28 31   GLUCOSE 263* 218* 217* 188*  BUN 21* 11 6 8   CREATININE 0.95 0.77 0.68 0.70  CALCIUM 9.1 9.0 9.2 9.2   GFR: Estimated Creatinine Clearance: 72.7 mL/min (by C-G formula based on SCr of 0.7 mg/dL). Liver Function Tests:  Recent Labs Lab 08/29/16 0120  AST 26  ALT 27  ALKPHOS 69  BILITOT 0.8  PROT 6.7  ALBUMIN 3.4*   No results for input(s): LIPASE, AMYLASE in the last 168 hours.  Recent Labs Lab 08/29/16 0556  AMMONIA 34   CBG:  Recent Labs Lab 09/01/16 1634 09/01/16 2038 09/02/16 0038 09/02/16 0414 09/02/16 0822  GLUCAP 201* 196* 169* 194* 170*   Lipid Profile: No results for input(s): CHOL, HDL, LDLCALC, TRIG, CHOLHDL, LDLDIRECT in the last 72 hours. Thyroid Function Tests: No results for input(s): TSH, T4TOTAL, FREET4, T3FREE, THYROIDAB in the last 72 hours. Anemia Panel: No results for input(s): VITAMINB12, FOLATE, FERRITIN, TIBC, IRON, RETICCTPCT in the last 72 hours. Urine analysis:    Component Value Date/Time   COLORURINE YELLOW 08/29/2016 1815   APPEARANCEUR CLOUDY (A) 08/29/2016 1815   LABSPEC 1.015 08/29/2016 1815   PHURINE 5.0 08/29/2016 1815   GLUCOSEU NEGATIVE 08/29/2016 1815   HGBUR SMALL (A) 08/29/2016 1815   BILIRUBINUR NEGATIVE 08/29/2016 1815   KETONESUR NEGATIVE 08/29/2016 1815   PROTEINUR NEGATIVE 08/29/2016 1815   UROBILINOGEN 0.2 02/12/2014 0110   NITRITE NEGATIVE 08/29/2016 1815   LEUKOCYTESUR LARGE (A) 08/29/2016 1815   Sepsis Labs: Invalid input(s): PROCALCITONIN, LACTICIDVEN  Recent Results (from the past 240 hour(s))  Blood Culture (routine x 2)     Status: Abnormal    Collection Time: 08/29/16  1:27 AM  Result Value Ref Range Status   Specimen Description BLOOD LEFT ARM  Final   Special Requests BLOOD 5CC  Final   Culture  Setup Time   Final    GRAM POSITIVE COCCI IN CHAINS IN BOTH AEROBIC AND ANAEROBIC BOTTLES CRITICAL RESULT CALLED TO, READ BACK BY AND VERIFIED WITH: J. Arminger Pharm.D. 15:40 08/29/16  ([email or web address])    Culture GROUP B STREP(S.AGALACTIAE)ISOLATED (A)  Final   Report Status 08/31/2016 FINAL  Final   Organism ID, Bacteria  GROUP B STREP(S.AGALACTIAE)ISOLATED  Final      Susceptibility   Group b strep(s.agalactiae)isolated - MIC*    CLINDAMYCIN >=1 RESISTANT Resistant     AMPICILLIN <=0.25 SENSITIVE Sensitive     ERYTHROMYCIN >=8 RESISTANT Resistant     VANCOMYCIN 0.5 SENSITIVE Sensitive     CEFTRIAXONE <=0.12 SENSITIVE Sensitive     LEVOFLOXACIN 1 SENSITIVE Sensitive     * GROUP B STREP(S.AGALACTIAE)ISOLATED  Blood Culture ID Panel (Reflexed)     Status: Abnormal   Collection Time: 08/29/16  1:27 AM  Result Value Ref Range Status   Enterococcus species NOT DETECTED NOT DETECTED Final   Listeria monocytogenes NOT DETECTED NOT DETECTED Final   Staphylococcus species NOT DETECTED NOT DETECTED Final   Staphylococcus aureus NOT DETECTED NOT DETECTED Final   Streptococcus species DETECTED (A) NOT DETECTED Final    Comment: CRITICAL RESULT CALLED TO, READ BACK BY AND VERIFIED WITH: J. Arminger Pharm.D. 15:40 08/29/16 ([email or web address])    Streptococcus agalactiae DETECTED (A) NOT DETECTED Final    Comment: CRITICAL RESULT CALLED TO, READ BACK BY AND VERIFIED WITH: J. Arminger Pharm.D. 15:40 08/29/16 ([email or web address])    Streptococcus pneumoniae NOT DETECTED NOT DETECTED Final   Streptococcus pyogenes NOT DETECTED NOT DETECTED Final   Acinetobacter baumannii NOT DETECTED NOT DETECTED Final   Enterobacteriaceae species NOT DETECTED NOT DETECTED Final   Enterobacter cloacae complex NOT DETECTED NOT DETECTED Final   Escherichia coli NOT DETECTED NOT DETECTED  Final   Klebsiella oxytoca NOT DETECTED NOT DETECTED Final   Klebsiella pneumoniae NOT DETECTED NOT DETECTED Final   Proteus species NOT DETECTED NOT DETECTED Final   Serratia marcescens NOT DETECTED NOT DETECTED Final   Haemophilus influenzae NOT DETECTED NOT DETECTED Final   Neisseria meningitidis NOT DETECTED NOT DETECTED Final   Pseudomonas aeruginosa NOT DETECTED NOT DETECTED Final   Candida albicans NOT DETECTED NOT DETECTED Final   Candida glabrata NOT DETECTED NOT DETECTED Final   Candida krusei NOT DETECTED NOT DETECTED Final   Candida parapsilosis NOT DETECTED NOT DETECTED Final   Candida tropicalis NOT DETECTED NOT DETECTED Final  Blood Culture (routine x 2)     Status: Abnormal   Collection Time: 08/29/16  3:18 AM  Result Value Ref Range Status   Specimen Description BLOOD LEFT ARM  Final   Special Requests IN PEDIATRIC BOTTLE 2ML  Final   Culture  Setup Time   Final    GRAM POSITIVE COCCI IN CHAINS IN PEDIATRIC BOTTLE CRITICAL VALUE NOTED.  VALUE IS CONSISTENT WITH PREVIOUSLY REPORTED AND CALLED VALUE.    Culture (A)  Final    GROUP B STREP(S.AGALACTIAE)ISOLATED SUSCEPTIBILITIES PERFORMED ON PREVIOUS CULTURE WITHIN THE LAST 5 DAYS.    Report Status 08/31/2016 FINAL  Final  Urine culture     Status: None   Collection Time: 08/29/16  6:15 PM  Result Value Ref Range Status   Specimen Description URINE, CATHETERIZED  Final   Special Requests NONE  Final   Culture NO GROWTH  Final   Report Status 08/30/2016 FINAL  Final  MRSA PCR Screening     Status: None   Collection Time: 08/29/16  8:06 PM  Result Value Ref Range Status   MRSA by PCR NEGATIVE NEGATIVE Final    Comment:        The GeneXpert MRSA Assay (FDA approved for NASAL specimens only), is one component of a comprehensive MRSA colonization surveillance program. It is not intended to diagnose MRSA infection nor to guide or monitor treatment for  MRSA infections.   Culture, blood (Routine X 2) w Reflex to  ID Panel     Status: None (Preliminary result)   Collection Time: 08/30/16  1:48 PM  Result Value Ref Range Status   Specimen Description BLOOD LEFT ARM  Final   Special Requests IN PEDIATRIC BOTTLE .5CC  Final   Culture NO GROWTH 2 DAYS  Final   Report Status PENDING  Incomplete  Culture, blood (Routine X 2) w Reflex to ID Panel     Status: None (Preliminary result)   Collection Time: 08/30/16  1:48 PM  Result Value Ref Range Status   Specimen Description BLOOD LEFT ARM  Final   Special Requests IN PEDIATRIC BOTTLE 1CC  Final   Culture NO GROWTH 2 DAYS  Final   Report Status PENDING  Incomplete      Radiology Studies: Dg Chest Port 1 View  Result Date: 08/31/2016 CLINICAL DATA:  Dyspnea.  History of pneumonia. EXAM: PORTABLE CHEST 1 VIEW COMPARISON:  08/30/2016 FINDINGS: The cardiac silhouette is enlarged. Mediastinal contours appear intact. Stable appearance of bilateral perihilar and lower lobe predominant airspace opacities. Mild increase of the interstitial markings. Probable bilateral pleural effusions. Osseous structures are without acute abnormality. Soft tissues are grossly normal. IMPRESSION: Cardiomegaly with interstitial pulmonary edema. Bilateral pleural effusions with lower lobe atelectasis versus airspace consolidation. Electronically Signed   By: Fidela Salisbury M.D.   On: 08/31/2016 16:14     Scheduled Meds: . amLODipine  10 mg Oral Daily  . atorvastatin  20 mg Oral Daily  . carvedilol  12.5 mg Oral BID WC  . clopidogrel  75 mg Oral Q breakfast  . enoxaparin (LOVENOX) injection  40 mg Subcutaneous Daily  . furosemide  40 mg Intravenous Q12H  . insulin aspart  0-9 Units Subcutaneous Q4H  . insulin glargine  15 Units Subcutaneous QHS  . isosorbide mononitrate  60 mg Oral Daily  . levofloxacin (LEVAQUIN) IV  750 mg Intravenous Q24H  . mupirocin cream   Topical Daily  . ranolazine  500 mg Oral BID  . sodium chloride flush  10-20 mL Intravenous Q12H  . vancomycin   1,000 mg Intravenous Q12H   Continuous Infusions:   Marzetta Board, MD, PhD [facility] Pager [phone]  If 7PM-7AM, please contact night-coverage [email or web address] Password [email or web address] 09/02/2016, 11:10 AM

## 2016-09-03 LAB — BASIC METABOLIC PANEL
Anion gap: 8 (ref 5–15)
BUN: 8 mg/dL (ref 6–20)
CALCIUM: 9.1 mg/dL (ref 8.9–10.3)
CO2: 32 mmol/L (ref 22–32)
CREATININE: 0.69 mg/dL (ref 0.44–1.00)
Chloride: 98 mmol/L — ABNORMAL LOW (ref 101–111)
Glucose, Bld: 196 mg/dL — ABNORMAL HIGH (ref 65–99)
Potassium: 3.6 mmol/L (ref 3.5–5.1)
SODIUM: 138 mmol/L (ref 135–145)

## 2016-09-03 LAB — GLUCOSE, CAPILLARY
GLUCOSE-CAPILLARY: 190 mg/dL — AB (ref 65–99)
GLUCOSE-CAPILLARY: 234 mg/dL — AB (ref 65–99)
GLUCOSE-CAPILLARY: 258 mg/dL — AB (ref 65–99)
Glucose-Capillary: 245 mg/dL — ABNORMAL HIGH (ref 65–99)
Glucose-Capillary: 180 mg/dL — ABNORMAL HIGH (ref 65–99)
Glucose-Capillary: 224 mg/dL — ABNORMAL HIGH (ref 65–99)
Glucose-Capillary: 228 mg/dL — ABNORMAL HIGH (ref 65–99)

## 2016-09-03 LAB — CBC
HCT: 27.1 % — ABNORMAL LOW (ref 36.0–46.0)
Hemoglobin: 8.4 g/dL — ABNORMAL LOW (ref 12.0–15.0)
MCH: 25.5 pg — ABNORMAL LOW (ref 26.0–34.0)
MCHC: 31 g/dL (ref 30.0–36.0)
MCV: 82.1 fL (ref 78.0–100.0)
Platelets: 177 10*3/uL (ref 150–400)
RBC: 3.3 MIL/uL — AB (ref 3.87–5.11)
RDW: 16 % — ABNORMAL HIGH (ref 11.5–15.5)
WBC: 7.8 10*3/uL (ref 4.0–10.5)

## 2016-09-03 NOTE — Progress Notes (Signed)
Inpatient Diabetes Program Recommendations  AACE/ADA: New Consensus Statement on Inpatient Glycemic Control (2015)  Target Ranges:  Prepandial:   less than 140 mg/dL      Peak postprandial:   less than 180 mg/dL (1-2 hours)      Critically ill patients:  140 - 180 mg/dL   Results for RINDA, CLOUTHIER (MRN [id number]) as of 09/03/2016 11:02  Ref. Range 09/02/2016 00:38 09/02/2016 04:14 09/02/2016 08:22 09/02/2016 11:27 09/02/2016 20:58  Glucose-Capillary Latest Ref Range: 65 - 99 mg/dL 169 (H) 194 (H) 170 (H) 308 (H) 229 (H)   Results for SHYA, YOUNGE (MRN [id number]) as of 09/03/2016 11:02  Ref. Range 09/03/2016 00:15 09/03/2016 04:34 09/03/2016 07:37  Glucose-Capillary Latest Ref Range: 65 - 99 mg/dL 234 (H) 180 (H) 190 (H)    Home DM Meds: Lantus 30 units QHS        Novolog 12 units TID  Current Orders: Lantus 15 units QHS         Novolog Sensitive Correction Scale/ SSI (0-9 units) Q4 hours      MD- Please consider the following in-hospital insulin adjustments:  1. Change Novolog SSI to TID AC + HS (currently ordered Q4 hours)  2. Increase Lantus to 20 units QHS  3. Start Novolog Meal Coverage: Novolog 6 units TID with meals (50% of home dose)      --Will follow patient during hospitalization--  Wyn Quaker RN, MSN, CDE Diabetes Coordinator Inpatient Glycemic Control Team Team Pager: [phone] (8a-5p)

## 2016-09-03 NOTE — Progress Notes (Signed)
PROGRESS NOTE  [name] [id number] DOB: 01-06-1941 DOA: 08/28/2016 PCP: [name], MD   LOS: 5 days   Brief Narrative: In brief, complex vascular patient with history of PAD, DM, HTN, prior CVA with residual right sided weakness admitted with sepsis due to pneumonia and bacteremia. She was fluid resuscitated on admission due to sepsis, now requiring IV diuresis due to Pulmonary edema and acute on chronic d CHF  Assessment & Plan: Principal Problem:   Acute encephalopathy Active Problems:   DM2 (diabetes mellitus, type 2) (HCC)   Atherosclerosis of native arteries of the extremities with ulceration ([place])   Essential hypertension   Ulcer of right lower extremity (HCC)   Sepsis ([place])   Bilateral pneumonia   Bacteremia due to group B Streptococcus   Sepsis due to Group B strep bacteremia - Blood cultures positive for group B strep, continue Levaquin alone as is sensitive based on antibiogram. She is penicillin allergic. - Surveillance cultures obtained 1/26, no growth to date  Acute hypoxic respiratory failure due to HCAP and Pulmonary edema - Likely due to fluid resuscitation in the setting of sepsis. Chest x-ray showed slight pulmonary vascular congestion versus worsening of her pneumonia.  - continue IV Lasix today, weights 213 >> 236 >> 229. Still edematous  Acute on chronic diastolic CHF - Most recent 2-D echo was done roughly 6 months ago on July 2017 and showed an ejection fraction of 55-60% as well as grade 2 diastolic dysfunction. Now fluid overload in the setting of fluid resuscitation due to sepsis - repeat 2d echo showed normal EF, and grade 1 diastolic dysfunction  HCAP - due to strep pneumonia  Acute encephalopathy - slowly resolving, she is much more alert today, this is likely due to #1 and #2   Hypertension - resume Coreg, Norvasc   Hyperlipidemia - continue statin  Type 2 diabetes mellitus - continue Lantus and sliding scale  Right lower  extremity ulcer - wound care consulted   Peripheral artery disease - Resume Plavix    DVT prophylaxis: Lovenox Code Status: Full code Family Communication: family bedside Disposition Plan: d/c SNF 2-3 days  Consultants:   None   Procedures:   2D echo Impressions: - Normal LV size with moderate LV hypertrophy. EF 60-65%. Normal RV size and systolic function. Mild to moderate aortic stenosis (moderate visually and by calculated valve area, mild by mean gradient).  Antimicrobials:  Vancomycin 1/25 >> 1/29  Levaquin 1/25 >>   Subjective: - on [place] this morning. No chest pain, mild shortness of breath   Objective: Vitals:   09/03/16 0740 09/03/16 0812 09/03/16 0914 09/03/16 0958  BP: (!) 158/57   (!) 158/57  Pulse:      Resp:      Temp:   99 F (37.2 C)   TempSrc:   Oral   SpO2:  100%    Weight:      Height:        Intake/Output Summary (Last 24 hours) at 09/03/16 1050 Last data filed at 09/03/16 0959  Gross per 24 hour  Intake              370 ml  Output             2100 ml  Net            -1730 ml   Filed Weights   08/31/16 0340 09/02/16 0419 09/03/16 0434  Weight: 104.8 kg (231 lb 1.6 oz) 101.7 kg (224 lb  3.2 oz) 103.9 kg (229 lb)   Examination: Constitutional: NAD Vitals:   09/03/16 0740 09/03/16 0812 09/03/16 0914 09/03/16 0958  BP: (!) 158/57   (!) 158/57  Pulse:      Resp:      Temp:   99 F (37.2 C)   TempSrc:   Oral   SpO2:  100%    Weight:      Height:       Eyes: PERRL, lids and conjunctivae normal Respiratory: clear to auscultation bilaterally, no wheezing, no crackles. Shallow respiratory effort Cardiovascular: Regular rate and rhythm, no murmurs / rubs / gallops. 1+ LE edema.  Abdomen: no tenderness. Bowel sounds positive.  Musculoskeletal: no clubbing / cyanosis. No joint deformity upper and lower extremities.  Skin: no rashes. RLE anterior shin wounds, no drainage, no surrounding cellulitis. Neurologic: CN 2-12 grossly intact.  Strength 5/5 in all 4.  Data Reviewed: I have personally reviewed following labs and imaging studies  CBC:  Recent Labs Lab 08/29/16 0120 08/31/16 0510 09/01/16 0415 09/02/16 0432 09/03/16 0500  WBC 13.8* 13.6* 14.3* 9.0 7.8  NEUTROABS 12.7*  --   --   --   --   HGB 11.3* 9.2* 8.8* 8.2* 8.4*  HCT 35.3* 28.8* 28.0* 26.9* 27.1*  MCV 81.1 80.9 81.6 83.8 82.1  PLT 163 126* 131* 125* [id number]   Basic Metabolic Panel:  Recent Labs Lab 08/29/16 0120 08/31/16 0510 09/01/16 0415 09/02/16 0432 09/03/16 0500  NA 135 140 141 141 138  K 3.5 3.3* 4.0 3.9 3.6  CL 99* 108 107 104 98*  CO2 22 25 28 31  32  GLUCOSE 263* 218* 217* 188* 196*  BUN 21* 11 6 8 8   CREATININE 0.95 0.77 0.68 0.70 0.69  CALCIUM 9.1 9.0 9.2 9.2 9.1   GFR: Estimated Creatinine Clearance: 73.6 mL/min (by C-G formula based on SCr of 0.69 mg/dL). Liver Function Tests:  Recent Labs Lab 08/29/16 0120  AST 26  ALT 27  ALKPHOS 69  BILITOT 0.8  PROT 6.7  ALBUMIN 3.4*   No results for input(s): LIPASE, AMYLASE in the last 168 hours.  Recent Labs Lab 08/29/16 0556  AMMONIA 34   CBG:  Recent Labs Lab 09/02/16 1127 09/02/16 2058 09/03/16 0015 09/03/16 0434 09/03/16 0737  GLUCAP 308* 229* 234* 180* 190*   Lipid Profile: No results for input(s): CHOL, HDL, LDLCALC, TRIG, CHOLHDL, LDLDIRECT in the last 72 hours. Thyroid Function Tests: No results for input(s): TSH, T4TOTAL, FREET4, T3FREE, THYROIDAB in the last 72 hours. Anemia Panel: No results for input(s): VITAMINB12, FOLATE, FERRITIN, TIBC, IRON, RETICCTPCT in the last 72 hours. Urine analysis:    Component Value Date/Time   COLORURINE YELLOW 08/29/2016 1815   APPEARANCEUR CLOUDY (A) 08/29/2016 1815   LABSPEC 1.015 08/29/2016 1815   PHURINE 5.0 08/29/2016 1815   GLUCOSEU NEGATIVE 08/29/2016 1815   HGBUR SMALL (A) 08/29/2016 1815   BILIRUBINUR NEGATIVE 08/29/2016 1815   KETONESUR NEGATIVE 08/29/2016 1815   PROTEINUR NEGATIVE 08/29/2016 1815    UROBILINOGEN 0.2 02/12/2014 0110   NITRITE NEGATIVE 08/29/2016 1815   LEUKOCYTESUR LARGE (A) 08/29/2016 1815   Sepsis Labs: Invalid input(s): PROCALCITONIN, LACTICIDVEN  Recent Results (from the past 240 hour(s))  Blood Culture (routine x 2)     Status: Abnormal   Collection Time: 08/29/16  1:27 AM  Result Value Ref Range Status   Specimen Description BLOOD LEFT ARM  Final   Special Requests BLOOD 5CC  Final   Culture  Setup Time  Final    GRAM POSITIVE COCCI IN CHAINS IN BOTH AEROBIC AND ANAEROBIC BOTTLES CRITICAL RESULT CALLED TO, READ BACK BY AND VERIFIED WITH: J. Arminger Pharm.D. 15:40 08/29/16  ([email or web address])    Culture GROUP B STREP(S.AGALACTIAE)ISOLATED (A)  Final   Report Status 08/31/2016 FINAL  Final   Organism ID, Bacteria GROUP B STREP(S.AGALACTIAE)ISOLATED  Final      Susceptibility   Group b strep(s.agalactiae)isolated - MIC*    CLINDAMYCIN >=1 RESISTANT Resistant     AMPICILLIN <=0.25 SENSITIVE Sensitive     ERYTHROMYCIN >=8 RESISTANT Resistant     VANCOMYCIN 0.5 SENSITIVE Sensitive     CEFTRIAXONE <=0.12 SENSITIVE Sensitive     LEVOFLOXACIN 1 SENSITIVE Sensitive     * GROUP B STREP(S.AGALACTIAE)ISOLATED  Blood Culture ID Panel (Reflexed)     Status: Abnormal   Collection Time: 08/29/16  1:27 AM  Result Value Ref Range Status   Enterococcus species NOT DETECTED NOT DETECTED Final   Listeria monocytogenes NOT DETECTED NOT DETECTED Final   Staphylococcus species NOT DETECTED NOT DETECTED Final   Staphylococcus aureus NOT DETECTED NOT DETECTED Final   Streptococcus species DETECTED (A) NOT DETECTED Final    Comment: CRITICAL RESULT CALLED TO, READ BACK BY AND VERIFIED WITH: J. Arminger Pharm.D. 15:40 08/29/16 ([email or web address])    Streptococcus agalactiae DETECTED (A) NOT DETECTED Final    Comment: CRITICAL RESULT CALLED TO, READ BACK BY AND VERIFIED WITH: J. Arminger Pharm.D. 15:40 08/29/16 ([email or web address])    Streptococcus pneumoniae NOT DETECTED NOT DETECTED Final    Streptococcus pyogenes NOT DETECTED NOT DETECTED Final   Acinetobacter baumannii NOT DETECTED NOT DETECTED Final   Enterobacteriaceae species NOT DETECTED NOT DETECTED Final   Enterobacter cloacae complex NOT DETECTED NOT DETECTED Final   Escherichia coli NOT DETECTED NOT DETECTED Final   Klebsiella oxytoca NOT DETECTED NOT DETECTED Final   Klebsiella pneumoniae NOT DETECTED NOT DETECTED Final   Proteus species NOT DETECTED NOT DETECTED Final   Serratia marcescens NOT DETECTED NOT DETECTED Final   Haemophilus influenzae NOT DETECTED NOT DETECTED Final   Neisseria meningitidis NOT DETECTED NOT DETECTED Final   Pseudomonas aeruginosa NOT DETECTED NOT DETECTED Final   Candida albicans NOT DETECTED NOT DETECTED Final   Candida glabrata NOT DETECTED NOT DETECTED Final   Candida krusei NOT DETECTED NOT DETECTED Final   Candida parapsilosis NOT DETECTED NOT DETECTED Final   Candida tropicalis NOT DETECTED NOT DETECTED Final  Blood Culture (routine x 2)     Status: Abnormal   Collection Time: 08/29/16  3:18 AM  Result Value Ref Range Status   Specimen Description BLOOD LEFT ARM  Final   Special Requests IN PEDIATRIC BOTTLE 2ML  Final   Culture  Setup Time   Final    GRAM POSITIVE COCCI IN CHAINS IN PEDIATRIC BOTTLE CRITICAL VALUE NOTED.  VALUE IS CONSISTENT WITH PREVIOUSLY REPORTED AND CALLED VALUE.    Culture (A)  Final    GROUP B STREP(S.AGALACTIAE)ISOLATED SUSCEPTIBILITIES PERFORMED ON PREVIOUS CULTURE WITHIN THE LAST 5 DAYS.    Report Status 08/31/2016 FINAL  Final  Urine culture     Status: None   Collection Time: 08/29/16  6:15 PM  Result Value Ref Range Status   Specimen Description URINE, CATHETERIZED  Final   Special Requests NONE  Final   Culture NO GROWTH  Final   Report Status 08/30/2016 FINAL  Final  MRSA PCR Screening     Status: None   Collection Time: 08/29/16  8:06 PM  Result Value Ref Range  Status   MRSA by PCR NEGATIVE NEGATIVE Final    Comment:        The  GeneXpert MRSA Assay (FDA approved for NASAL specimens only), is one component of a comprehensive MRSA colonization surveillance program. It is not intended to diagnose MRSA infection nor to guide or monitor treatment for MRSA infections.   Culture, blood (Routine X 2) w Reflex to ID Panel     Status: None (Preliminary result)   Collection Time: 08/30/16  1:48 PM  Result Value Ref Range Status   Specimen Description BLOOD LEFT ARM  Final   Special Requests IN PEDIATRIC BOTTLE .5CC  Final   Culture NO GROWTH 3 DAYS  Final   Report Status PENDING  Incomplete  Culture, blood (Routine X 2) w Reflex to ID Panel     Status: None (Preliminary result)   Collection Time: 08/30/16  1:48 PM  Result Value Ref Range Status   Specimen Description BLOOD LEFT ARM  Final   Special Requests IN PEDIATRIC BOTTLE 1CC  Final   Culture NO GROWTH 3 DAYS  Final   Report Status PENDING  Incomplete      Radiology Studies: No results found.   Scheduled Meds: . amLODipine  10 mg Oral Daily  . atorvastatin  20 mg Oral Daily  . carvedilol  12.5 mg Oral BID WC  . clopidogrel  75 mg Oral Q breakfast  . enoxaparin (LOVENOX) injection  40 mg Subcutaneous Daily  . feeding supplement (GLUCERNA SHAKE)  237 mL Oral TID BM  . furosemide  40 mg Intravenous Q12H  . insulin aspart  0-9 Units Subcutaneous Q4H  . insulin glargine  15 Units Subcutaneous QHS  . isosorbide mononitrate  60 mg Oral Daily  . levofloxacin (LEVAQUIN) IV  750 mg Intravenous Q24H  . mupirocin cream   Topical Daily  . ranolazine  500 mg Oral BID  . sodium chloride flush  10-20 mL Intravenous Q12H   Continuous Infusions:   Marzetta Board, MD, PhD [facility] Pager [phone]  If 7PM-7AM, please contact night-coverage [email or web address] Password [email or web address] 09/03/2016, 10:50 AM

## 2016-09-03 NOTE — Clinical Social Work Note (Signed)
Clinical Social Worker continuing to follow patient and family for support and discharge planning needs.  CSW spoke with patient husband over the phone who is now agreeable with SNF placement at [facility] - patient husband does state that he may change his mind prior to discharge and take patient home with 24 hour care, however at this moment is agreeable to placement.  CSW contacted facility who plans to initiate insurance authorization with [facility] and will follow up when received.  CSW remains available for support and to facilitate patient discharge needs once medically stable.  Barbette Or, [place]

## 2016-09-03 NOTE — Progress Notes (Signed)
Physical Therapy Treatment Patient Details Name: [name] MRN: [id number] DOB: 01-14-1941 Today's Date: 09/03/2016    History of Present Illness 76 YO F admitted with encephalopathy. PMH includes HTN, DM, diastolic acute CHF, PAD, RLE BPG, CVA, aortic stenosis, neuropathy, edema.     PT Comments    Pt with decreased mobility and increased assistance needed during PT session. Pt unable to safely attempt ambulation but was able to perform stand pivot to chair. All mobility requires +2 assistance. Based upon the patient's current mobility level, recommending SNF for further rehabilitation following acute stay.   Follow Up Recommendations  SNF;Supervision/Assistance - 24 hour     Equipment Recommendations   (to be addressed at next venue)    Recommendations for Other Services       Precautions / Restrictions Precautions Precautions: Fall Restrictions Weight Bearing Restrictions: No    Mobility  Bed Mobility Overal bed mobility: Needs Assistance Bed Mobility: Supine to Sit   Sidelying to sit: +2 for physical assistance;Max assist       General bed mobility comments: Pt providing minimal assistance throughout despite encouragement  Transfers Overall transfer level: Needs assistance Equipment used: Rolling walker (2 wheeled) Transfers: Sit to/from Stand Sit to Stand: +2 physical assistance;Max assist Stand pivot transfers: +2 physical assistance;Mod assist       General transfer comment: repeat attempt X2, cues for hand placement and forward lean. Pt attempting to sit once turning toward chair.   Ambulation/Gait             General Gait Details: unable to safely attempt   Stairs            Wheelchair Mobility    Modified Rankin (Stroke Patients Only)       Balance Overall balance assessment: Needs assistance Sitting-balance support: Bilateral upper extremity supported Sitting balance-Leahy Scale: Poor Sitting balance - Comments: pt able to  maintain sitting balance for short periods of time   Standing balance support: Bilateral upper extremity supported Standing balance-Leahy Scale: Poor Standing balance comment: using rw and physical assist                    Cognition Arousal/Alertness: Awake/alert Behavior During Therapy: Flat affect Overall Cognitive Status: Impaired/Different from baseline Area of Impairment: Orientation Orientation Level: Situation       Safety/Judgement: Decreased awareness of safety;Decreased awareness of deficits          Exercises      General Comments        Pertinent Vitals/Pain Pain Assessment: Faces Faces Pain Scale: Hurts little more Pain Location: Rt foot Pain Descriptors / Indicators: Grimacing;Moaning Pain Intervention(s): Limited activity within patient's tolerance;Monitored during session    Home Living                      Prior Function            PT Goals (current goals can now be found in the care plan section) Acute Rehab PT Goals Patient Stated Goal: not expressed PT Goal Formulation: With patient/family Time For Goal Achievement: 09/14/16 Potential to Achieve Goals: Fair Progress towards PT goals: Not progressing toward goals - comment (pt with decreased mobility and increased assist during PT )    Frequency    Min 2X/week      PT Plan Frequency needs to be updated    Co-evaluation             End of Session Equipment Utilized During  Treatment: Gait belt Activity Tolerance: Patient limited by pain;Patient limited by fatigue Patient left: in chair;with call bell/phone within reach     Time: 1306-1329 PT Time Calculation (min) (ACUTE ONLY): 23 min  Charges:  $Therapeutic Activity: 23-37 mins                    G Codes:      Cassell Clement, PT, CSCS Pager [phone] Office [phone]  09/03/2016, 2:24 PM

## 2016-09-04 ENCOUNTER — Ambulatory Visit: Payer: Medicare HMO | Admitting: Internal Medicine

## 2016-09-04 LAB — CBC
HCT: 28 % — ABNORMAL LOW (ref 36.0–46.0)
Hemoglobin: 8.8 g/dL — ABNORMAL LOW (ref 12.0–15.0)
MCH: 25.4 pg — ABNORMAL LOW (ref 26.0–34.0)
MCHC: 31.4 g/dL (ref 30.0–36.0)
MCV: 80.9 fL (ref 78.0–100.0)
Platelets: 234 10*3/uL (ref 150–400)
RBC: 3.46 MIL/uL — ABNORMAL LOW (ref 3.87–5.11)
RDW: 15.7 % — AB (ref 11.5–15.5)
WBC: 9.4 10*3/uL (ref 4.0–10.5)

## 2016-09-04 LAB — CULTURE, BLOOD (ROUTINE X 2)
CULTURE: NO GROWTH
CULTURE: NO GROWTH

## 2016-09-04 LAB — BASIC METABOLIC PANEL
Anion gap: 6 (ref 5–15)
BUN: 7 mg/dL (ref 6–20)
CHLORIDE: 98 mmol/L — AB (ref 101–111)
CO2: 35 mmol/L — ABNORMAL HIGH (ref 22–32)
Calcium: 9.2 mg/dL (ref 8.9–10.3)
Creatinine, Ser: 0.73 mg/dL (ref 0.44–1.00)
GFR calc Af Amer: >60 mL/min (ref >60)
GFR calc non Af Amer: >60 mL/min (ref >60)
GLUCOSE: 184 mg/dL — AB (ref 65–99)
POTASSIUM: 3.1 mmol/L — AB (ref 3.5–5.1)
Sodium: 139 mmol/L (ref 135–145)

## 2016-09-04 LAB — GLUCOSE, CAPILLARY
GLUCOSE-CAPILLARY: 273 mg/dL — AB (ref 65–99)
Glucose-Capillary: 178 mg/dL — ABNORMAL HIGH (ref 65–99)
Glucose-Capillary: 191 mg/dL — ABNORMAL HIGH (ref 65–99)
Glucose-Capillary: 252 mg/dL — ABNORMAL HIGH (ref 65–99)
Glucose-Capillary: 263 mg/dL — ABNORMAL HIGH (ref 65–99)
Glucose-Capillary: 284 mg/dL — ABNORMAL HIGH (ref 65–99)

## 2016-09-04 MED ORDER — LEVOFLOXACIN 750 MG PO TABS
750.0000 mg | ORAL_TABLET | Freq: Every day | ORAL | Status: DC
  Administered 2016-09-05 – 2016-09-17 (×13): 750 mg via ORAL
  Filled 2016-09-04 (×13): qty 1

## 2016-09-04 MED ORDER — POTASSIUM CHLORIDE CRYS ER 20 MEQ PO TBCR
40.0000 meq | EXTENDED_RELEASE_TABLET | Freq: Two times a day (BID) | ORAL | Status: AC
  Administered 2016-09-04 (×2): 40 meq via ORAL
  Filled 2016-09-04 (×2): qty 2

## 2016-09-04 MED ORDER — HYDRALAZINE HCL 20 MG/ML IJ SOLN
10.0000 mg | INTRAMUSCULAR | Status: DC | PRN
  Administered 2016-09-10: 10 mg via INTRAVENOUS
  Filled 2016-09-04: qty 1

## 2016-09-04 NOTE — Progress Notes (Signed)
PROGRESS NOTE    [name]  [id number] DOB: 11/21/1940 DOA: 08/28/2016 PCP: [name], MD    Brief Narrative:  Briefly, complex vascular patient with history of PAD, DM, HTN, prior CVA with residual right sided weakness admitted with sepsis due to pneumonia and bacteremia. She was fluid resuscitated on admission due to sepsis, now requiring IV diuresis due to Pulmonary edema and acute on chronic d CHF  Assessment & Plan:   Principal Problem:   Acute encephalopathy Active Problems:   DM2 (diabetes mellitus, type 2) (HCC)   Atherosclerosis of native arteries of the extremities with ulceration (HCC)   Essential hypertension   Ulcer of right lower extremity (HCC)   Sepsis ([place])   Bilateral pneumonia   Bacteremia due to group B Streptococcus   Sepsis due to Group B strep bacteremia - Blood cultures were positive for group B strep, continue Levaquin alone as is sensitive based on antibiogram. She is penicillin allergic. - Surveillance cultures obtained 1/26, no growth to date - Will change to PO abx  Acute hypoxic respiratory failure due to HCAP and Pulmonary edema - Likely due to fluid resuscitation in the setting of sepsis. Chest x-ray showed slight pulmonary vascular congestion versus worsening of her pneumonia.  - continue IV Lasix as tolerated. Clinically improving, still edematous  Acute on chronic diastolic CHF - Most recent 2-D echo was done roughly 6 months ago on July 2017 and showed an ejection fraction of 55-60% as well as grade 2 diastolic dysfunction. Now fluid overload in the setting of fluid resuscitation due to sepsis - repeat 2d echo showed normal EF, and grade 1 diastolic dysfunction - Stable at present  HCAP - due to strep pneumonia  Acute encephalopathy  - Appears to be slowly resolving, suspect secondary to #1 and #2   Hypertension  - Continue on Coreg, Norvasc   Hyperlipidemia  - continue statin as tolerated  Type 2 diabetes  mellitus  - For now, will continue Lantus and sliding scale  Right lower extremity ulcer  - wound care was consulted   Peripheral artery disease  - Will continue Plavix  - Stable  DVT prophylaxis: Lovenox Code Status: Full Family Communication: Pt in room, family not at bedside Disposition Plan: Uncertain at this time  Consultants:     Procedures:   2D echo Impressions: - Normal LV size with moderate LV hypertrophy. EF 60-65%. Normal RVsize and systolic function. Mild to moderate aortic stenosis(moderate visually and by calculated valve area, mild by meangradient).  Antimicrobials: Anti-infectives    Start     Dose/Rate Route Frequency Ordered Stop   09/01/16 0800  vancomycin (VANCOCIN) IVPB 1000 mg/200 mL premix  Status:  Discontinued     1,000 mg 200 mL/hr over 60 Minutes Intravenous Every 12 hours 09/01/16 0731 09/02/16 1110   08/30/16 0800  levofloxacin (LEVAQUIN) IVPB 750 mg     750 mg 100 mL/hr over 90 Minutes Intravenous Every 24 hours 08/29/16 0545     08/29/16 1800  vancomycin (VANCOCIN) IVPB 750 mg/150 ml premix  Status:  Discontinued     750 mg 150 mL/hr over 60 Minutes Intravenous Every 12 hours 08/29/16 0545 09/01/16 0731   08/29/16 0600  aztreonam (AZACTAM) 2 g in dextrose 5 % 50 mL IVPB  Status:  Discontinued     2 g 100 mL/hr over 30 Minutes Intravenous Every 8 hours 08/29/16 0402 08/29/16 1558   08/29/16 0415  levofloxacin (LEVAQUIN) IVPB 750 mg    Comments:  sepsis   750 mg 100 mL/hr over 90 Minutes Intravenous  Once 08/29/16 0402 08/29/16 0826   08/29/16 0400  aztreonam (AZACTAM) injection 1 g  Status:  Discontinued     1 g Intramuscular Every 8 hours 08/29/16 0312 08/29/16 0539   08/29/16 0330  vancomycin (VANCOCIN) 1,500 mg in sodium chloride 0.9 % 500 mL IVPB     1,500 mg 250 mL/hr over 120 Minutes Intravenous  Once 08/29/16 0304 08/29/16 1019   08/29/16 0315  levofloxacin (LEVAQUIN) tablet 750 mg  Status:  Discontinued     750 mg Oral   Once 08/29/16 0304 08/29/16 0402   08/29/16 0130  vancomycin (VANCOCIN) 1,500 mg in sodium chloride 0.9 % 500 mL IVPB  Status:  Discontinued     1,500 mg 250 mL/hr over 120 Minutes Intravenous  Once 08/29/16 0116 08/29/16 0304   08/29/16 0115  levofloxacin (LEVAQUIN) IVPB 750 mg  Status:  Discontinued     750 mg 100 mL/hr over 90 Minutes Intravenous  Once 08/29/16 0108 08/29/16 0304   08/29/16 0115  aztreonam (AZACTAM) 2 g in dextrose 5 % 50 mL IVPB  Status:  Discontinued     2 g 100 mL/hr over 30 Minutes Intravenous  Once 08/29/16 0108 08/29/16 0312   08/29/16 0115  vancomycin (VANCOCIN) IVPB 1000 mg/200 mL premix  Status:  Discontinued     1,000 mg 200 mL/hr over 60 Minutes Intravenous  Once 08/29/16 0108 08/29/16 0116       Subjective: No complaints at this time  Objective: Vitals:   09/04/16 0529 09/04/16 0854 09/04/16 1126 09/04/16 1241  BP: (!) 171/67 (!) 162/64 (!) 136/53   Pulse:  86 80   Resp:  (!) 22 20   Temp:  98.2 F (36.8 C) 97.9 F (36.6 C)   TempSrc:  Oral Oral   SpO2:  100% 99% 98%  Weight: 99.4 kg (219 lb 2.2 oz)     Height:        Intake/Output Summary (Last 24 hours) at 09/04/16 1459 Last data filed at 09/04/16 1409  Gross per 24 hour  Intake             2060 ml  Output             1260 ml  Net              800 ml   Filed Weights   09/02/16 0419 09/03/16 0434 09/04/16 0529  Weight: 101.7 kg (224 lb 3.2 oz) 103.9 kg (229 lb) 99.4 kg (219 lb 2.2 oz)    Examination:  General exam: Appears calm and comfortable  Respiratory system: decreased BS throughout, mildly increased resp effort. Cardiovascular system: S1 & S2 heard, RRR. No JVD, murmurs, rubs, gallops or clicks. No pedal edema. Gastrointestinal system: Abdomen is nondistended, soft and nontender. No organomegaly or masses felt. Normal bowel sounds heard. Central nervous system: Alert and oriented. No focal neurological deficits. Extremities: Symmetric 5 x 5 power. Skin: No rashes, lesions    Psychiatry: Judgement and insight appear normal. Mood & affect appropriate.   Data Reviewed: I have personally reviewed following labs and imaging studies  CBC:  Recent Labs Lab 08/29/16 0120 08/31/16 0510 09/01/16 0415 09/02/16 0432 09/03/16 0500 09/04/16 0550  WBC 13.8* 13.6* 14.3* 9.0 7.8 9.4  NEUTROABS 12.7*  --   --   --   --   --   HGB 11.3* 9.2* 8.8* 8.2* 8.4* 8.8*  HCT 35.3* 28.8* 28.0* 26.9* 27.1*  28.0*  MCV 81.1 80.9 81.6 83.8 82.1 80.9  PLT 163 126* 131* 125* 177 [id number]   Basic Metabolic Panel:  Recent Labs Lab 08/31/16 0510 09/01/16 0415 09/02/16 0432 09/03/16 0500 09/04/16 0550  NA 140 141 141 138 139  K 3.3* 4.0 3.9 3.6 3.1*  CL 108 107 104 98* 98*  CO2 25 28 31  32 35*  GLUCOSE 218* 217* 188* 196* 184*  BUN 11 6 8 8 7   CREATININE 0.77 0.68 0.70 0.69 0.73  CALCIUM 9.0 9.2 9.2 9.1 9.2   GFR: Estimated Creatinine Clearance: 71.8 mL/min (by C-G formula based on SCr of 0.73 mg/dL). Liver Function Tests:  Recent Labs Lab 08/29/16 0120  AST 26  ALT 27  ALKPHOS 69  BILITOT 0.8  PROT 6.7  ALBUMIN 3.4*   No results for input(s): LIPASE, AMYLASE in the last 168 hours.  Recent Labs Lab 08/29/16 0556  AMMONIA 34   Coagulation Profile: No results for input(s): INR, PROTIME in the last 168 hours. Cardiac Enzymes: No results for input(s): CKTOTAL, CKMB, CKMBINDEX, TROPONINI in the last 168 hours. BNP (last 3 results) No results for input(s): PROBNP in the last 8760 hours. HbA1C: No results for input(s): HGBA1C in the last 72 hours. CBG:  Recent Labs Lab 09/03/16 2038 09/04/16 0048 09/04/16 0407 09/04/16 0752 09/04/16 1124  GLUCAP 228* 252* 191* 178* 284*   Lipid Profile: No results for input(s): CHOL, HDL, LDLCALC, TRIG, CHOLHDL, LDLDIRECT in the last 72 hours. Thyroid Function Tests: No results for input(s): TSH, T4TOTAL, FREET4, T3FREE, THYROIDAB in the last 72 hours. Anemia Panel: No results for input(s): VITAMINB12, FOLATE,  FERRITIN, TIBC, IRON, RETICCTPCT in the last 72 hours. Sepsis Labs:  Recent Labs Lab 08/29/16 0133 08/29/16 0602 08/29/16 1038  LATICACIDVEN 2.77* 5.03* 2.4*    Recent Results (from the past 240 hour(s))  Blood Culture (routine x 2)     Status: Abnormal   Collection Time: 08/29/16  1:27 AM  Result Value Ref Range Status   Specimen Description BLOOD LEFT ARM  Final   Special Requests BLOOD 5CC  Final   Culture  Setup Time   Final    GRAM POSITIVE COCCI IN CHAINS IN BOTH AEROBIC AND ANAEROBIC BOTTLES CRITICAL RESULT CALLED TO, READ BACK BY AND VERIFIED WITH: J. Arminger Pharm.D. 15:40 08/29/16  ([email or web address])    Culture GROUP B STREP(S.AGALACTIAE)ISOLATED (A)  Final   Report Status 08/31/2016 FINAL  Final   Organism ID, Bacteria GROUP B STREP(S.AGALACTIAE)ISOLATED  Final      Susceptibility   Group b strep(s.agalactiae)isolated - MIC*    CLINDAMYCIN >=1 RESISTANT Resistant     AMPICILLIN <=0.25 SENSITIVE Sensitive     ERYTHROMYCIN >=8 RESISTANT Resistant     VANCOMYCIN 0.5 SENSITIVE Sensitive     CEFTRIAXONE <=0.12 SENSITIVE Sensitive     LEVOFLOXACIN 1 SENSITIVE Sensitive     * GROUP B STREP(S.AGALACTIAE)ISOLATED  Blood Culture ID Panel (Reflexed)     Status: Abnormal   Collection Time: 08/29/16  1:27 AM  Result Value Ref Range Status   Enterococcus species NOT DETECTED NOT DETECTED Final   Listeria monocytogenes NOT DETECTED NOT DETECTED Final   Staphylococcus species NOT DETECTED NOT DETECTED Final   Staphylococcus aureus NOT DETECTED NOT DETECTED Final   Streptococcus species DETECTED (A) NOT DETECTED Final    Comment: CRITICAL RESULT CALLED TO, READ BACK BY AND VERIFIED WITH: J. Arminger Pharm.D. 15:40 08/29/16 ([email or web address])    Streptococcus agalactiae DETECTED (A) NOT DETECTED Final    Comment:  CRITICAL RESULT CALLED TO, READ BACK BY AND VERIFIED WITH: J. Arminger Pharm.D. 15:40 08/29/16 ([email or web address])    Streptococcus pneumoniae NOT DETECTED NOT DETECTED Final   Streptococcus  pyogenes NOT DETECTED NOT DETECTED Final   Acinetobacter baumannii NOT DETECTED NOT DETECTED Final   Enterobacteriaceae species NOT DETECTED NOT DETECTED Final   Enterobacter cloacae complex NOT DETECTED NOT DETECTED Final   Escherichia coli NOT DETECTED NOT DETECTED Final   Klebsiella oxytoca NOT DETECTED NOT DETECTED Final   Klebsiella pneumoniae NOT DETECTED NOT DETECTED Final   Proteus species NOT DETECTED NOT DETECTED Final   Serratia marcescens NOT DETECTED NOT DETECTED Final   Haemophilus influenzae NOT DETECTED NOT DETECTED Final   Neisseria meningitidis NOT DETECTED NOT DETECTED Final   Pseudomonas aeruginosa NOT DETECTED NOT DETECTED Final   Candida albicans NOT DETECTED NOT DETECTED Final   Candida glabrata NOT DETECTED NOT DETECTED Final   Candida krusei NOT DETECTED NOT DETECTED Final   Candida parapsilosis NOT DETECTED NOT DETECTED Final   Candida tropicalis NOT DETECTED NOT DETECTED Final  Blood Culture (routine x 2)     Status: Abnormal   Collection Time: 08/29/16  3:18 AM  Result Value Ref Range Status   Specimen Description BLOOD LEFT ARM  Final   Special Requests IN PEDIATRIC BOTTLE 2ML  Final   Culture  Setup Time   Final    GRAM POSITIVE COCCI IN CHAINS IN PEDIATRIC BOTTLE CRITICAL VALUE NOTED.  VALUE IS CONSISTENT WITH PREVIOUSLY REPORTED AND CALLED VALUE.    Culture (A)  Final    GROUP B STREP(S.AGALACTIAE)ISOLATED SUSCEPTIBILITIES PERFORMED ON PREVIOUS CULTURE WITHIN THE LAST 5 DAYS.    Report Status 08/31/2016 FINAL  Final  Urine culture     Status: None   Collection Time: 08/29/16  6:15 PM  Result Value Ref Range Status   Specimen Description URINE, CATHETERIZED  Final   Special Requests NONE  Final   Culture NO GROWTH  Final   Report Status 08/30/2016 FINAL  Final  MRSA PCR Screening     Status: None   Collection Time: 08/29/16  8:06 PM  Result Value Ref Range Status   MRSA by PCR NEGATIVE NEGATIVE Final    Comment:        The GeneXpert MRSA  Assay (FDA approved for NASAL specimens only), is one component of a comprehensive MRSA colonization surveillance program. It is not intended to diagnose MRSA infection nor to guide or monitor treatment for MRSA infections.   Culture, blood (Routine X 2) w Reflex to ID Panel     Status: None (Preliminary result)   Collection Time: 08/30/16  1:48 PM  Result Value Ref Range Status   Specimen Description BLOOD LEFT ARM  Final   Special Requests IN PEDIATRIC BOTTLE .5CC  Final   Culture NO GROWTH 4 DAYS  Final   Report Status PENDING  Incomplete  Culture, blood (Routine X 2) w Reflex to ID Panel     Status: None (Preliminary result)   Collection Time: 08/30/16  1:48 PM  Result Value Ref Range Status   Specimen Description BLOOD LEFT ARM  Final   Special Requests IN PEDIATRIC BOTTLE 1CC  Final   Culture NO GROWTH 4 DAYS  Final   Report Status PENDING  Incomplete     Radiology Studies: No results found.  Scheduled Meds: . amLODipine  10 mg Oral Daily  . atorvastatin  20 mg Oral Daily  . carvedilol  12.5 mg Oral BID WC  .  clopidogrel  75 mg Oral Q breakfast  . enoxaparin (LOVENOX) injection  40 mg Subcutaneous Daily  . feeding supplement (GLUCERNA SHAKE)  237 mL Oral TID BM  . furosemide  40 mg Intravenous Q12H  . insulin aspart  0-9 Units Subcutaneous Q4H  . insulin glargine  15 Units Subcutaneous QHS  . isosorbide mononitrate  60 mg Oral Daily  . levofloxacin (LEVAQUIN) IV  750 mg Intravenous Q24H  . mupirocin cream   Topical Daily  . potassium chloride  40 mEq Oral BID  . ranolazine  500 mg Oral BID  . sodium chloride flush  10-20 mL Intravenous Q12H   Continuous Infusions:   LOS: 6 days   Aviendha Azbell, Orpah Melter, MD [facility] Pager [phone]  If 7PM-7AM, please contact night-coverage [email or web address] Password [email or web address] 09/04/2016, 2:59 PM

## 2016-09-04 NOTE — Progress Notes (Signed)
No acute events overnight. Alert x4 with prompting Patient did have anxiety relived by PRN Lorazepam. Blood pressure was elevated overnight however subsided new BP PRN antihypertensive available with admin limits. POC fluid management with Q12 lasix, awaiting placement at [facility] pending insurance authorization.

## 2016-09-04 NOTE — Progress Notes (Signed)
Pharmacy Antibiotic Note  [name] is a 76 y.o. female admitted on 08/28/2016 with GBS bacteremia and HCAP.  Pharmacy has been consulted for levofloxacin dosing. Repeat blood cultures have been no growth. Noted PCN allergy. Renal function stable   Plan: -Continue levofloxacin 750mg  IV q24h (change to PO?) -Monitor length of therapy, cultures, and renal function  Height: 5\' 4"  (162.6 cm) Weight: 219 lb 2.2 oz (99.4 kg) IBW/kg (Calculated) : 54.7  Temp (24hrs), Avg:98.1 F (36.7 C), Min:97.7 F (36.5 C), Max:98.5 F (36.9 C)   Recent Labs Lab 08/29/16 0133 08/29/16 0602 08/29/16 1038 08/31/16 0510 09/01/16 0415 09/01/16 0610 09/02/16 0432 09/03/16 0500 09/04/16 0550  WBC  --   --   --  13.6* 14.3*  --  9.0 7.8 9.4  CREATININE  --   --   --  0.77 0.68  --  0.70 0.69 0.73  LATICACIDVEN 2.77* 5.03* 2.4*  --   --   --   --   --   --   VANCOTROUGH  --   --   --   --   --  13*  --   --   --     Estimated Creatinine Clearance: 71.8 mL/min (by C-G formula based on SCr of 0.73 mg/dL).    Allergies  Allergen Reactions  . Iodine Anaphylaxis  . Penicillins Anaphylaxis    Rec'd cefazolin 02/04/14 pre-op for femur ORIF  . Shellfish Allergy Anaphylaxis  . Shellfish-Derived Products Anaphylaxis  . Sulfa Antibiotics Anaphylaxis  . Sulfacetamide Sodium Anaphylaxis  . Sulfasalazine Anaphylaxis  . Atorvastatin     Other reaction(s): Unknown    Antimicrobials this admission: 1/25 Vanc >> 1/29 1/25 Aztreonam >> 1/26 1/25 Levaquin >>  Dose adjustments this admission: 1/28 VT = 13 > increase vancomycin to 1000mg  q12h  Microbiology results: 1/25 BCx x2: GBS (confirmed by BCID) 1/25 UCx: no growth final 1/26 BCx: NGTD    Thank you for allowing [place] to participate in this patients care.  Jens Som, PharmD Clinical phone for 09/04/2016 from 7a-3:30p: x [phone] If after 3:30p, please call main pharmacy at: [phone] 09/04/2016 2:07 PM

## 2016-09-05 LAB — BASIC METABOLIC PANEL
Anion gap: 6 (ref 5–15)
BUN: 6 mg/dL (ref 6–20)
CO2: 34 mmol/L — ABNORMAL HIGH (ref 22–32)
CREATININE: 0.72 mg/dL (ref 0.44–1.00)
Calcium: 9 mg/dL (ref 8.9–10.3)
Chloride: 99 mmol/L — ABNORMAL LOW (ref 101–111)
GFR calc Af Amer: >60 mL/min (ref >60)
Glucose, Bld: 206 mg/dL — ABNORMAL HIGH (ref 65–99)
Potassium: 4.1 mmol/L (ref 3.5–5.1)
SODIUM: 139 mmol/L (ref 135–145)

## 2016-09-05 LAB — CBC
HCT: 27.4 % — ABNORMAL LOW (ref 36.0–46.0)
Hemoglobin: 8.7 g/dL — ABNORMAL LOW (ref 12.0–15.0)
MCH: 25.7 pg — ABNORMAL LOW (ref 26.0–34.0)
MCHC: 31.8 g/dL (ref 30.0–36.0)
MCV: 81.1 fL (ref 78.0–100.0)
PLATELETS: 260 10*3/uL (ref 150–400)
RBC: 3.38 MIL/uL — ABNORMAL LOW (ref 3.87–5.11)
RDW: 15.6 % — AB (ref 11.5–15.5)
WBC: 9.5 10*3/uL (ref 4.0–10.5)

## 2016-09-05 LAB — GLUCOSE, CAPILLARY
GLUCOSE-CAPILLARY: 201 mg/dL — AB (ref 65–99)
Glucose-Capillary: 219 mg/dL — ABNORMAL HIGH (ref 65–99)
Glucose-Capillary: 234 mg/dL — ABNORMAL HIGH (ref 65–99)
Glucose-Capillary: 243 mg/dL — ABNORMAL HIGH (ref 65–99)
Glucose-Capillary: 289 mg/dL — ABNORMAL HIGH (ref 65–99)
Glucose-Capillary: 290 mg/dL — ABNORMAL HIGH (ref 65–99)

## 2016-09-05 MED ORDER — TRAMADOL HCL 50 MG PO TABS
50.0000 mg | ORAL_TABLET | Freq: Four times a day (QID) | ORAL | Status: AC | PRN
  Administered 2016-09-05 – 2016-09-07 (×2): 50 mg via ORAL
  Filled 2016-09-05 (×2): qty 1

## 2016-09-05 MED ORDER — INSULIN GLARGINE 100 UNIT/ML ~~LOC~~ SOLN
20.0000 [IU] | Freq: Every day | SUBCUTANEOUS | Status: DC
  Administered 2016-09-05: 20 [IU] via SUBCUTANEOUS
  Filled 2016-09-05 (×2): qty 0.2

## 2016-09-05 MED ORDER — FUROSEMIDE 10 MG/ML IJ SOLN
60.0000 mg | Freq: Two times a day (BID) | INTRAMUSCULAR | Status: DC
  Administered 2016-09-05 – 2016-09-06 (×2): 60 mg via INTRAVENOUS
  Filled 2016-09-05 (×2): qty 6

## 2016-09-05 MED FILL — Nutritional Supplement Liquid: ORAL | Qty: 237 | Status: AC

## 2016-09-05 NOTE — Progress Notes (Addendum)
PROGRESS NOTE    [name]  [id number] DOB: 07-Mar-1941 DOA: 08/28/2016 PCP: [name], MD    Brief Narrative:  Briefly, complex vascular patient with history of PAD, DM, HTN, prior CVA with residual right sided weakness admitted with sepsis due to pneumonia and bacteremia. She was fluid resuscitated on admission due to sepsis, now requiring IV diuresis due to Pulmonary edema and acute on chronic dCHF  Assessment & Plan:   Principal Problem:   Acute encephalopathy Active Problems:   DM2 (diabetes mellitus, type 2) (HCC)   Atherosclerosis of native arteries of the extremities with ulceration (HCC)   Essential hypertension   Ulcer of right lower extremity (HCC)   Sepsis ([place])   Bilateral pneumonia   Bacteremia due to group B Streptococcus   Sepsis due to Group B strep bacteremia - Blood cultures were positive for group B strep, continue Levaquin alone as is sensitive based on antibiogram. She is penicillin allergic. - Surveillance cultures obtained 1/26, no growth to date - Now on oral abx  Acute hypoxic respiratory failure due to HCAP and Pulmonary edema - Likely due to fluid resuscitation in the setting of sepsis. Chest x-ray showed slight pulmonary vascular congestion versus worsening of her pneumonia.  - Pt is continued on IV Lasix as tolerated. Clinically improving, however remains edematous - Still mildly sob. Will increase lasix to 60mg  IV q12hrs - Repeat cxr in am for interval change  Acute on chronic diastolic CHF - Most recent 2-D echo was done roughly 6 months ago on July 2017 and showed an ejection fraction of 55-60% as well as grade 2 diastolic dysfunction. Now fluid overload in the setting of fluid resuscitation due to sepsis - repeat 2d echo showed normal EF, and grade 1 diastolic dysfunction - increase lasix dose per above  HCAP - due to strep pneumonia - On abx per above  Acute encephalopathy  - Appears to be slowly resolving, suspect  secondary to #1 and #2  - appropriately conversant this AM  Hypertension  - Continued on Coreg, Norvasc - stable at present  Hyperlipidemia  - Will continue statin as tolerated - Stable currently  Type 2 diabetes mellitus  - For now, will continue lantus and sliding scale - Glucose in the 200's. Increase lantus to 20 units  Right lower extremity ulcer  - wound care was consulted - Stable at present   Peripheral artery disease  - continued on Plavix  - Remains stable  DVT prophylaxis: Lovenox Code Status: Full Family Communication: Pt in room, family not at bedside Disposition Plan: Uncertain at this time  Consultants:     Procedures:   2D echo Impressions: - Normal LV size with moderate LV hypertrophy. EF 60-65%. Normal RVsize and systolic function. Mild to moderate aortic stenosis(moderate visually and by calculated valve area, mild by meangradient).  Antimicrobials: Anti-infectives    Start     Dose/Rate Route Frequency Ordered Stop   09/05/16 1000  levofloxacin (LEVAQUIN) tablet 750 mg     750 mg Oral Daily 09/04/16 1519     09/01/16 0800  vancomycin (VANCOCIN) IVPB 1000 mg/200 mL premix  Status:  Discontinued     1,000 mg 200 mL/hr over 60 Minutes Intravenous Every 12 hours 09/01/16 0731 09/02/16 1110   08/30/16 0800  levofloxacin (LEVAQUIN) IVPB 750 mg  Status:  Discontinued     750 mg 100 mL/hr over 90 Minutes Intravenous Every 24 hours 08/29/16 0545 09/04/16 1519   08/29/16 1800  vancomycin (VANCOCIN)  IVPB 750 mg/150 ml premix  Status:  Discontinued     750 mg 150 mL/hr over 60 Minutes Intravenous Every 12 hours 08/29/16 0545 09/01/16 0731   08/29/16 0600  aztreonam (AZACTAM) 2 g in dextrose 5 % 50 mL IVPB  Status:  Discontinued     2 g 100 mL/hr over 30 Minutes Intravenous Every 8 hours 08/29/16 0402 08/29/16 1558   08/29/16 0415  levofloxacin (LEVAQUIN) IVPB 750 mg    Comments:  sepsis   750 mg 100 mL/hr over 90 Minutes Intravenous  Once  08/29/16 0402 08/29/16 0826   08/29/16 0400  aztreonam (AZACTAM) injection 1 g  Status:  Discontinued     1 g Intramuscular Every 8 hours 08/29/16 0312 08/29/16 0539   08/29/16 0330  vancomycin (VANCOCIN) 1,500 mg in sodium chloride 0.9 % 500 mL IVPB     1,500 mg 250 mL/hr over 120 Minutes Intravenous  Once 08/29/16 0304 08/29/16 1019   08/29/16 0315  levofloxacin (LEVAQUIN) tablet 750 mg  Status:  Discontinued     750 mg Oral  Once 08/29/16 0304 08/29/16 0402   08/29/16 0130  vancomycin (VANCOCIN) 1,500 mg in sodium chloride 0.9 % 500 mL IVPB  Status:  Discontinued     1,500 mg 250 mL/hr over 120 Minutes Intravenous  Once 08/29/16 0116 08/29/16 0304   08/29/16 0115  levofloxacin (LEVAQUIN) IVPB 750 mg  Status:  Discontinued     750 mg 100 mL/hr over 90 Minutes Intravenous  Once 08/29/16 0108 08/29/16 0304   08/29/16 0115  aztreonam (AZACTAM) 2 g in dextrose 5 % 50 mL IVPB  Status:  Discontinued     2 g 100 mL/hr over 30 Minutes Intravenous  Once 08/29/16 0108 08/29/16 0312   08/29/16 0115  vancomycin (VANCOCIN) IVPB 1000 mg/200 mL premix  Status:  Discontinued     1,000 mg 200 mL/hr over 60 Minutes Intravenous  Once 08/29/16 0108 08/29/16 0116      Subjective: Without complaints at present  Objective: Vitals:   09/05/16 0430 09/05/16 0755 09/05/16 1111 09/05/16 1300  BP: (!) 146/46 (!) 147/57 (!) 141/49   Pulse: 82 82 81   Resp: 20 (!) 22 (!) 22   Temp: 98.6 F (37 C) 98.3 F (36.8 C)  97.6 F (36.4 C)  TempSrc: Axillary Oral  Oral  SpO2: 100% 100% 100%   Weight:      Height:        Intake/Output Summary (Last 24 hours) at 09/05/16 1457 Last data filed at 09/05/16 1411  Gross per 24 hour  Intake              957 ml  Output             2300 ml  Net            -1343 ml   Filed Weights   09/02/16 0419 09/03/16 0434 09/04/16 0529  Weight: 101.7 kg (224 lb 3.2 oz) 103.9 kg (229 lb) 99.4 kg (219 lb 2.2 oz)    Examination:  General exam: Laying in bed, in  nad Respiratory system: mildly increased resp effort, no wheezing Cardiovascular system: regular rate, s1, s2 Gastrointestinal system: soft, nondistended, pos bs Central nervous system: cn2-12 grossly intact, strength intact Extremities: perfused, no clubbing Skin: normal skin turgor, no notable skin lesions seen  Psychiatry: mood normal// no visual hallucinations.   Data Reviewed: I have personally reviewed following labs and imaging studies  CBC:  Recent Labs Lab 09/01/16 0415  09/02/16 0432 09/03/16 0500 09/04/16 0550 09/05/16 0430  WBC 14.3* 9.0 7.8 9.4 9.5  HGB 8.8* 8.2* 8.4* 8.8* 8.7*  HCT 28.0* 26.9* 27.1* 28.0* 27.4*  MCV 81.6 83.8 82.1 80.9 81.1  PLT 131* 125* 177 234 [id number]   Basic Metabolic Panel:  Recent Labs Lab 09/01/16 0415 09/02/16 0432 09/03/16 0500 09/04/16 0550 09/05/16 0430  NA 141 141 138 139 139  K 4.0 3.9 3.6 3.1* 4.1  CL 107 104 98* 98* 99*  CO2 28 31 32 35* 34*  GLUCOSE 217* 188* 196* 184* 206*  BUN 6 8 8 7 6   CREATININE 0.68 0.70 0.69 0.73 0.72  CALCIUM 9.2 9.2 9.1 9.2 9.0   GFR: Estimated Creatinine Clearance: 71.8 mL/min (by C-G formula based on SCr of 0.72 mg/dL). Liver Function Tests: No results for input(s): AST, ALT, ALKPHOS, BILITOT, PROT, ALBUMIN in the last 168 hours. No results for input(s): LIPASE, AMYLASE in the last 168 hours. No results for input(s): AMMONIA in the last 168 hours. Coagulation Profile: No results for input(s): INR, PROTIME in the last 168 hours. Cardiac Enzymes: No results for input(s): CKTOTAL, CKMB, CKMBINDEX, TROPONINI in the last 168 hours. BNP (last 3 results) No results for input(s): PROBNP in the last 8760 hours. HbA1C: No results for input(s): HGBA1C in the last 72 hours. CBG:  Recent Labs Lab 09/04/16 2028 09/05/16 0024 09/05/16 0430 09/05/16 0739 09/05/16 1110  GLUCAP 273* 243* 219* 201* 234*   Lipid Profile: No results for input(s): CHOL, HDL, LDLCALC, TRIG, CHOLHDL, LDLDIRECT in the  last 72 hours. Thyroid Function Tests: No results for input(s): TSH, T4TOTAL, FREET4, T3FREE, THYROIDAB in the last 72 hours. Anemia Panel: No results for input(s): VITAMINB12, FOLATE, FERRITIN, TIBC, IRON, RETICCTPCT in the last 72 hours. Sepsis Labs: No results for input(s): PROCALCITON, LATICACIDVEN in the last 168 hours.  Recent Results (from the past 240 hour(s))  Blood Culture (routine x 2)     Status: Abnormal   Collection Time: 08/29/16  1:27 AM  Result Value Ref Range Status   Specimen Description BLOOD LEFT ARM  Final   Special Requests BLOOD 5CC  Final   Culture  Setup Time   Final    GRAM POSITIVE COCCI IN CHAINS IN BOTH AEROBIC AND ANAEROBIC BOTTLES CRITICAL RESULT CALLED TO, READ BACK BY AND VERIFIED WITH: J. Arminger Pharm.D. 15:40 08/29/16  ([email or web address])    Culture GROUP B STREP(S.AGALACTIAE)ISOLATED (A)  Final   Report Status 08/31/2016 FINAL  Final   Organism ID, Bacteria GROUP B STREP(S.AGALACTIAE)ISOLATED  Final      Susceptibility   Group b strep(s.agalactiae)isolated - MIC*    CLINDAMYCIN >=1 RESISTANT Resistant     AMPICILLIN <=0.25 SENSITIVE Sensitive     ERYTHROMYCIN >=8 RESISTANT Resistant     VANCOMYCIN 0.5 SENSITIVE Sensitive     CEFTRIAXONE <=0.12 SENSITIVE Sensitive     LEVOFLOXACIN 1 SENSITIVE Sensitive     * GROUP B STREP(S.AGALACTIAE)ISOLATED  Blood Culture ID Panel (Reflexed)     Status: Abnormal   Collection Time: 08/29/16  1:27 AM  Result Value Ref Range Status   Enterococcus species NOT DETECTED NOT DETECTED Final   Listeria monocytogenes NOT DETECTED NOT DETECTED Final   Staphylococcus species NOT DETECTED NOT DETECTED Final   Staphylococcus aureus NOT DETECTED NOT DETECTED Final   Streptococcus species DETECTED (A) NOT DETECTED Final    Comment: CRITICAL RESULT CALLED TO, READ BACK BY AND VERIFIED WITH: J. Arminger Pharm.D. 15:40 08/29/16 ([email or web address])    Streptococcus agalactiae DETECTED (  A) NOT DETECTED Final    Comment: CRITICAL RESULT CALLED  TO, READ BACK BY AND VERIFIED WITH: J. Arminger Pharm.D. 15:40 08/29/16 ([email or web address])    Streptococcus pneumoniae NOT DETECTED NOT DETECTED Final   Streptococcus pyogenes NOT DETECTED NOT DETECTED Final   Acinetobacter baumannii NOT DETECTED NOT DETECTED Final   Enterobacteriaceae species NOT DETECTED NOT DETECTED Final   Enterobacter cloacae complex NOT DETECTED NOT DETECTED Final   Escherichia coli NOT DETECTED NOT DETECTED Final   Klebsiella oxytoca NOT DETECTED NOT DETECTED Final   Klebsiella pneumoniae NOT DETECTED NOT DETECTED Final   Proteus species NOT DETECTED NOT DETECTED Final   Serratia marcescens NOT DETECTED NOT DETECTED Final   Haemophilus influenzae NOT DETECTED NOT DETECTED Final   Neisseria meningitidis NOT DETECTED NOT DETECTED Final   Pseudomonas aeruginosa NOT DETECTED NOT DETECTED Final   Candida albicans NOT DETECTED NOT DETECTED Final   Candida glabrata NOT DETECTED NOT DETECTED Final   Candida krusei NOT DETECTED NOT DETECTED Final   Candida parapsilosis NOT DETECTED NOT DETECTED Final   Candida tropicalis NOT DETECTED NOT DETECTED Final  Blood Culture (routine x 2)     Status: Abnormal   Collection Time: 08/29/16  3:18 AM  Result Value Ref Range Status   Specimen Description BLOOD LEFT ARM  Final   Special Requests IN PEDIATRIC BOTTLE 2ML  Final   Culture  Setup Time   Final    GRAM POSITIVE COCCI IN CHAINS IN PEDIATRIC BOTTLE CRITICAL VALUE NOTED.  VALUE IS CONSISTENT WITH PREVIOUSLY REPORTED AND CALLED VALUE.    Culture (A)  Final    GROUP B STREP(S.AGALACTIAE)ISOLATED SUSCEPTIBILITIES PERFORMED ON PREVIOUS CULTURE WITHIN THE LAST 5 DAYS.    Report Status 08/31/2016 FINAL  Final  Urine culture     Status: None   Collection Time: 08/29/16  6:15 PM  Result Value Ref Range Status   Specimen Description URINE, CATHETERIZED  Final   Special Requests NONE  Final   Culture NO GROWTH  Final   Report Status 08/30/2016 FINAL  Final  MRSA PCR Screening      Status: None   Collection Time: 08/29/16  8:06 PM  Result Value Ref Range Status   MRSA by PCR NEGATIVE NEGATIVE Final    Comment:        The GeneXpert MRSA Assay (FDA approved for NASAL specimens only), is one component of a comprehensive MRSA colonization surveillance program. It is not intended to diagnose MRSA infection nor to guide or monitor treatment for MRSA infections.   Culture, blood (Routine X 2) w Reflex to ID Panel     Status: None   Collection Time: 08/30/16  1:48 PM  Result Value Ref Range Status   Specimen Description BLOOD LEFT ARM  Final   Special Requests IN PEDIATRIC BOTTLE .5CC  Final   Culture NO GROWTH 5 DAYS  Final   Report Status 09/04/2016 FINAL  Final  Culture, blood (Routine X 2) w Reflex to ID Panel     Status: None   Collection Time: 08/30/16  1:48 PM  Result Value Ref Range Status   Specimen Description BLOOD LEFT ARM  Final   Special Requests IN PEDIATRIC BOTTLE [place]  Final   Culture NO GROWTH 5 DAYS  Final   Report Status 09/04/2016 FINAL  Final     Radiology Studies: No results found.  Scheduled Meds: . amLODipine  10 mg Oral Daily  . atorvastatin  20 mg Oral Daily  . carvedilol  12.5  mg Oral BID WC  . clopidogrel  75 mg Oral Q breakfast  . enoxaparin (LOVENOX) injection  40 mg Subcutaneous Daily  . feeding supplement (GLUCERNA SHAKE)  237 mL Oral TID BM  . furosemide  60 mg Intravenous Q12H  . insulin aspart  0-9 Units Subcutaneous Q4H  . insulin glargine  15 Units Subcutaneous QHS  . isosorbide mononitrate  60 mg Oral Daily  . levofloxacin  750 mg Oral Daily  . mupirocin cream   Topical Daily  . ranolazine  500 mg Oral BID  . sodium chloride flush  10-20 mL Intravenous Q12H   Continuous Infusions:   LOS: 7 days   Tatia Petrucci, Orpah Melter, MD [facility] Pager [phone]  If 7PM-7AM, please contact night-coverage [email or web address] Password [email or web address] 09/05/2016, 2:57 PM

## 2016-09-05 NOTE — Care Management Important Message (Signed)
Important Message  Patient Details  Name: [name] MRN: [id number] Date of Birth: 08/24/1940   Medicare Important Message Given:  Yes    Nathen May 09/05/2016, 12:01 PM

## 2016-09-05 NOTE — Progress Notes (Signed)
Results for LASHELL, ARDOLINO (MRN [id number]) as of 09/05/2016 10:01  Ref. Range 09/04/2016 15:42 09/04/2016 20:28 09/05/2016 00:24 09/05/2016 04:30 09/05/2016 07:39  Glucose-Capillary Latest Ref Range: 65 - 99 mg/dL 263 (H) 273 (H) 243 (H) 219 (H) 201 (H)  Noted that blood sugars have been greater than 180 mg/dl. Recommend increasing Lantus to 20 units daily. Will continue to monitor blood sugars while in the hospital. Harvel Ricks RN BSN CDE

## 2016-09-05 NOTE — Progress Notes (Signed)
Pharmacy called on 09/03/16 at 0940 I charted I gave patient 237 containers of glucerna instead of 237 ml. I was unable to go back today and change there documentation. Pharmacy suggested I put a note in to system.

## 2016-09-06 ENCOUNTER — Inpatient Hospital Stay (HOSPITAL_COMMUNITY): Payer: Medicare HMO

## 2016-09-06 LAB — BASIC METABOLIC PANEL
ANION GAP: 7 (ref 5–15)
BUN: 9 mg/dL (ref 6–20)
CO2: 34 mmol/L — ABNORMAL HIGH (ref 22–32)
Calcium: 9 mg/dL (ref 8.9–10.3)
Chloride: 96 mmol/L — ABNORMAL LOW (ref 101–111)
Creatinine, Ser: 0.72 mg/dL (ref 0.44–1.00)
GFR calc Af Amer: >60 mL/min (ref >60)
GLUCOSE: 276 mg/dL — AB (ref 65–99)
POTASSIUM: 4.1 mmol/L (ref 3.5–5.1)
SODIUM: 137 mmol/L (ref 135–145)

## 2016-09-06 LAB — GLUCOSE, CAPILLARY
GLUCOSE-CAPILLARY: 215 mg/dL — AB (ref 65–99)
GLUCOSE-CAPILLARY: 261 mg/dL — AB (ref 65–99)
GLUCOSE-CAPILLARY: 275 mg/dL — AB (ref 65–99)
GLUCOSE-CAPILLARY: 281 mg/dL — AB (ref 65–99)
Glucose-Capillary: 256 mg/dL — ABNORMAL HIGH (ref 65–99)
Glucose-Capillary: 242 mg/dL — ABNORMAL HIGH (ref 65–99)

## 2016-09-06 MED ORDER — ONDANSETRON HCL 4 MG/2ML IJ SOLN
4.0000 mg | Freq: Four times a day (QID) | INTRAMUSCULAR | Status: DC | PRN
  Administered 2016-09-06 – 2016-09-15 (×17): 4 mg via INTRAVENOUS
  Filled 2016-09-06 (×17): qty 2

## 2016-09-06 MED ORDER — INSULIN GLARGINE 100 UNIT/ML ~~LOC~~ SOLN
25.0000 [IU] | Freq: Every day | SUBCUTANEOUS | Status: DC
  Administered 2016-09-06 – 2016-09-09 (×4): 25 [IU] via SUBCUTANEOUS
  Filled 2016-09-06 (×5): qty 0.25

## 2016-09-06 MED ORDER — MAGNESIUM CITRATE PO SOLN
1.0000 | Freq: Once | ORAL | Status: AC
  Administered 2016-09-06: 1 via ORAL
  Filled 2016-09-06: qty 296

## 2016-09-06 MED ORDER — FUROSEMIDE 10 MG/ML IJ SOLN
80.0000 mg | Freq: Two times a day (BID) | INTRAMUSCULAR | Status: DC
  Administered 2016-09-06 – 2016-09-11 (×10): 80 mg via INTRAVENOUS
  Filled 2016-09-06 (×10): qty 8

## 2016-09-06 NOTE — Progress Notes (Signed)
Spoke Dr. Wyline Copas, who stated to keep pt's foley in so he can observe her intake and out put. Md ordered to increase her IV lasix. Pt incontinent. Will cont to monitor pt.

## 2016-09-06 NOTE — Progress Notes (Signed)
Inpatient Diabetes Program Recommendations  AACE/ADA: New Consensus Statement on Inpatient Glycemic Control (2015)  Target Ranges:  Prepandial:   less than 140 mg/dL      Peak postprandial:   less than 180 mg/dL (1-2 hours)      Critically ill patients:  140 - 180 mg/dL   Lab Results  Component Value Date   GLUCAP 256 (H) 09/06/2016   HGBA1C 8.4 (H) 07/06/2016   Results for SHEANA, ANDARY (MRN [id number]) as of 09/06/2016 14:08  Ref. Range 09/05/2016 07:39 09/05/2016 11:10 09/05/2016 16:32 09/05/2016 20:56 09/06/2016 00:36 09/06/2016 04:22 09/06/2016 07:43 09/06/2016 11:41  Glucose-Capillary Latest Ref Range: 65 - 99 mg/dL 201 (H) 234 (H) 290 (H) 289 (H) 215 (H) 261 (H) 281 (H) 256 (H)    Current orders for Inpatient glycemic control:     Sensitive correction scale Novolog 0-9 units Q4H    Lantus 25 units QHS     Inpatient Diabetes Program Recommendations, please consider the following:     1. Change Novolog SSI to TID AC + HS (currently ordered Q4 hours)  2. Increase Lantus to 30 units QHS  3. Start Novolog Meal Coverage: Novolog 6 units TID with meals (50% of home dose) if patient eats > 50% of meal.  Thank you,  Windy Carina, RN, MSN Diabetes Coordinator Inpatient Diabetes Program [phone] (Team Pager)

## 2016-09-06 NOTE — Progress Notes (Signed)
Physical Therapy Treatment Patient Details Name: [name] MRN: [id number] DOB: September 27, 1940 Today's Date: 09/06/2016    History of Present Illness 76 YO F admitted with encephalopathy. PMH includes HTN, DM, diastolic acute CHF, PAD, RLE BPG, CVA, aortic stenosis, neuropathy, edema.     PT Comments    Pt with limited participation and mobility today due to pt indicating she feels "sick".  Pt clearing her throat and spitting multiple times during session, though not vomiting and RN gave nausea meds prior to PT arrival.  Feel pt will need SNF level of care at D/C.    Follow Up Recommendations  SNF     Equipment Recommendations  None recommended by PT    Recommendations for Other Services       Precautions / Restrictions Precautions Precautions: Fall Restrictions Weight Bearing Restrictions: No    Mobility  Bed Mobility Overal bed mobility: Needs Assistance Bed Mobility: Supine to Sit;Sit to Supine     Supine to sit: Max assist;+2 for physical assistance;HOB elevated Sit to supine: Max assist;+2 for physical assistance   General bed mobility comments: cues throughout for technique and participation, however pt with minimal participation.    Transfers                    Ambulation/Gait                 Stairs            Wheelchair Mobility    Modified Rankin (Stroke Patients Only)       Balance Overall balance assessment: Needs assistance Sitting-balance support: Single extremity supported;Bilateral upper extremity supported;No upper extremity supported;Feet supported Sitting balance-Leahy Scale: Fair Sitting balance - Comments: pt needs occasional UE support to maintain sitting balance.  pt again noting that she feels sick and keeps clearing her throat and spitting.                              Cognition Arousal/Alertness: Awake/alert Behavior During Therapy: Flat affect Overall Cognitive Status: Impaired/Different from  baseline Area of Impairment: Orientation;Attention;Memory;Following commands;Safety/judgement;Awareness;Problem solving Orientation Level: Situation Current Attention Level: Sustained Memory: Decreased short-term memory Following Commands: Follows one step commands inconsistently;Follows one step commands with increased time Safety/Judgement: Decreased awareness of deficits;Decreased awareness of safety Awareness: Intellectual Problem Solving: Slow processing;Difficulty sequencing;Requires verbal cues;Requires tactile cues      Exercises      General Comments        Pertinent Vitals/Pain Pain Assessment: Faces Faces Pain Scale: Hurts even more Pain Location: pt did not give location, but kept stating she is sick. Pain Descriptors / Indicators: Grimacing;Moaning Pain Intervention(s): Monitored during session;Premedicated before session;Repositioned    Home Living                      Prior Function            PT Goals (current goals can now be found in the care plan section) Acute Rehab PT Goals Patient Stated Goal: not expressed PT Goal Formulation: With patient/family Time For Goal Achievement: 09/14/16 Potential to Achieve Goals: Fair Progress towards PT goals: Progressing toward goals    Frequency    Min 2X/week      PT Plan Current plan remains appropriate    Co-evaluation             End of Session Equipment Utilized During Treatment: Oxygen Activity Tolerance: Patient limited by  pain;Patient limited by fatigue Patient left: in bed;with call bell/phone within reach;with bed alarm set     Time: [id number] PT Time Calculation (min) (ACUTE ONLY): 22 min  Charges:  $Therapeutic Activity: 8-22 mins                    G CodesCatarina Hartshorn, [place] [phone] 09/06/2016, 2:20 PM

## 2016-09-06 NOTE — Progress Notes (Signed)
Pt complained of felling nauseated this morning. MD aware. PRN Zofran ordered. Will cont to monitor pt.

## 2016-09-06 NOTE — Progress Notes (Signed)
PROGRESS NOTE    [name]  [id number] DOB: 12-19-1940 DOA: 08/28/2016 PCP: [name], MD    Brief Narrative:  Briefly, complex vascular patient with history of PAD, DM, HTN, prior CVA with residual right sided weakness admitted with sepsis due to pneumonia and bacteremia. She was fluid resuscitated on admission due to sepsis, now requiring IV diuresis due to Pulmonary edema and acute on chronic dCHF  Assessment & Plan:   Principal Problem:   Acute encephalopathy Active Problems:   DM2 (diabetes mellitus, type 2) (HCC)   Atherosclerosis of native arteries of the extremities with ulceration (HCC)   Essential hypertension   Ulcer of right lower extremity (HCC)   Sepsis ([place])   Bilateral pneumonia   Bacteremia due to group B Streptococcus   Sepsis due to Group B strep bacteremia - Blood cultures were positive for group B strep, continue Levaquin alone as is sensitive based on antibiogram. She is penicillin allergic. - Surveillance cultures obtained 1/26, no growth to date - Now on PO levaquin  Acute hypoxic respiratory failure due to HCAP and Pulmonary edema - Likely due to fluid resuscitation in the setting of sepsis. Chest x-ray showed slight pulmonary vascular congestion versus worsening of her pneumonia.  - Pt is continued on IV Lasix as tolerated. Clinically improving, however remains edematous - Still mildly sob and edematous -Increase lasix to 80mg  IV bid - Repeat cxr in am for interval change  Acute on chronic diastolic CHF - Most recent 2-D echo was done roughly 6 months ago on July 2017 and showed an ejection fraction of 55-60% as well as grade 2 diastolic dysfunction. Now fluid overload in the setting of fluid resuscitation due to sepsis - repeat 2d echo showed normal EF, and grade 1 diastolic dysfunction - increased lasix dose per above  HCAP - due to strep pneumonia - On oral abx per above  Acute encephalopathy  - Appears to be slowly resolving,  suspect secondary to #1 and #2  - seems appropriate this AM  Hypertension  - Continued on Coreg, Norvasc - remains stable at present  Hyperlipidemia  - Will continue statin as tolerated - Stable at present  Type 2 diabetes mellitus  - For now, will continue lantus and sliding scale - Glucose remains in the 200's. Increase lantus to 25 units  Right lower extremity ulcer  - wound care was consulted - Stable currently  Peripheral artery disease  - continued on Plavix  - Stable at this time  Constipation - N/V with abd pain this AM - Xray abd ordered and reviewed - Stool seen throughout colon per my review - Will give trial of mg citrate  DVT prophylaxis: Lovenox Code Status: Full Family Communication: Pt in room, family not at bedside Disposition Plan: Uncertain at this time  Consultants:     Procedures:   2D echo Impressions: - Normal LV size with moderate LV hypertrophy. EF 60-65%. Normal RVsize and systolic function. Mild to moderate aortic stenosis(moderate visually and by calculated valve area, mild by meangradient).  Antimicrobials: Anti-infectives    Start     Dose/Rate Route Frequency Ordered Stop   09/05/16 1000  levofloxacin (LEVAQUIN) tablet 750 mg     750 mg Oral Daily 09/04/16 1519     09/01/16 0800  vancomycin (VANCOCIN) IVPB 1000 mg/200 mL premix  Status:  Discontinued     1,000 mg 200 mL/hr over 60 Minutes Intravenous Every 12 hours 09/01/16 0731 09/02/16 1110   08/30/16 0800  levofloxacin (LEVAQUIN) IVPB 750 mg  Status:  Discontinued     750 mg 100 mL/hr over 90 Minutes Intravenous Every 24 hours 08/29/16 0545 09/04/16 1519   08/29/16 1800  vancomycin (VANCOCIN) IVPB 750 mg/150 ml premix  Status:  Discontinued     750 mg 150 mL/hr over 60 Minutes Intravenous Every 12 hours 08/29/16 0545 09/01/16 0731   08/29/16 0600  aztreonam (AZACTAM) 2 g in dextrose 5 % 50 mL IVPB  Status:  Discontinued     2 g 100 mL/hr over 30 Minutes Intravenous  Every 8 hours 08/29/16 0402 08/29/16 1558   08/29/16 0415  levofloxacin (LEVAQUIN) IVPB 750 mg    Comments:  sepsis   750 mg 100 mL/hr over 90 Minutes Intravenous  Once 08/29/16 0402 08/29/16 0826   08/29/16 0400  aztreonam (AZACTAM) injection 1 g  Status:  Discontinued     1 g Intramuscular Every 8 hours 08/29/16 0312 08/29/16 0539   08/29/16 0330  vancomycin (VANCOCIN) 1,500 mg in sodium chloride 0.9 % 500 mL IVPB     1,500 mg 250 mL/hr over 120 Minutes Intravenous  Once 08/29/16 0304 08/29/16 1019   08/29/16 0315  levofloxacin (LEVAQUIN) tablet 750 mg  Status:  Discontinued     750 mg Oral  Once 08/29/16 0304 08/29/16 0402   08/29/16 0130  vancomycin (VANCOCIN) 1,500 mg in sodium chloride 0.9 % 500 mL IVPB  Status:  Discontinued     1,500 mg 250 mL/hr over 120 Minutes Intravenous  Once 08/29/16 0116 08/29/16 0304   08/29/16 0115  levofloxacin (LEVAQUIN) IVPB 750 mg  Status:  Discontinued     750 mg 100 mL/hr over 90 Minutes Intravenous  Once 08/29/16 0108 08/29/16 0304   08/29/16 0115  aztreonam (AZACTAM) 2 g in dextrose 5 % 50 mL IVPB  Status:  Discontinued     2 g 100 mL/hr over 30 Minutes Intravenous  Once 08/29/16 0108 08/29/16 0312   08/29/16 0115  vancomycin (VANCOCIN) IVPB 1000 mg/200 mL premix  Status:  Discontinued     1,000 mg 200 mL/hr over 60 Minutes Intravenous  Once 08/29/16 0108 08/29/16 0116      Subjective: Complains of nausea/vomiting with abd pain  Objective: Vitals:   09/06/16 0511 09/06/16 0756 09/06/16 0800 09/06/16 1400  BP:  131/66  (!) 133/45  Pulse:  79  80  Resp:  17  18  Temp:  97.5 F (36.4 C)  98.3 F (36.8 C)  TempSrc:  Oral  Oral  SpO2: 100% 98%  99%  Weight:   102.8 kg (226 lb 10.1 oz)   Height:        Intake/Output Summary (Last 24 hours) at 09/06/16 1529 Last data filed at 09/06/16 1300  Gross per 24 hour  Intake              220 ml  Output             2250 ml  Net            -2030 ml   Filed Weights   09/03/16 0434 09/04/16  0529 09/06/16 0800  Weight: 103.9 kg (229 lb) 99.4 kg (219 lb 2.2 oz) 102.8 kg (226 lb 10.1 oz)    Examination:  General exam: Awake, in nad, conversant Respiratory system: normal resp effort, no audible wheezing Cardiovascular system: regular rhythm, s1-2 on auscultation Gastrointestinal system: generally tender w/o rebound, decreased BS Central nervous system: no seizures, no tremors Extremities: no cyanosis, no joint  deformities Skin: no rashes, no pallor Psychiatry: affect normal// no auditory hallucinations.   Data Reviewed: I have personally reviewed following labs and imaging studies  CBC:  Recent Labs Lab 09/01/16 0415 09/02/16 0432 09/03/16 0500 09/04/16 0550 09/05/16 0430  WBC 14.3* 9.0 7.8 9.4 9.5  HGB 8.8* 8.2* 8.4* 8.8* 8.7*  HCT 28.0* 26.9* 27.1* 28.0* 27.4*  MCV 81.6 83.8 82.1 80.9 81.1  PLT 131* 125* 177 234 [id number]   Basic Metabolic Panel:  Recent Labs Lab 09/02/16 0432 09/03/16 0500 09/04/16 0550 09/05/16 0430 09/06/16 0510  NA 141 138 139 139 137  K 3.9 3.6 3.1* 4.1 4.1  CL 104 98* 98* 99* 96*  CO2 31 32 35* 34* 34*  GLUCOSE 188* 196* 184* 206* 276*  BUN 8 8 7 6 9   CREATININE 0.70 0.69 0.73 0.72 0.72  CALCIUM 9.2 9.1 9.2 9.0 9.0   GFR: Estimated Creatinine Clearance: 73.1 mL/min (by C-G formula based on SCr of 0.72 mg/dL). Liver Function Tests: No results for input(s): AST, ALT, ALKPHOS, BILITOT, PROT, ALBUMIN in the last 168 hours. No results for input(s): LIPASE, AMYLASE in the last 168 hours. No results for input(s): AMMONIA in the last 168 hours. Coagulation Profile: No results for input(s): INR, PROTIME in the last 168 hours. Cardiac Enzymes: No results for input(s): CKTOTAL, CKMB, CKMBINDEX, TROPONINI in the last 168 hours. BNP (last 3 results) No results for input(s): PROBNP in the last 8760 hours. HbA1C: No results for input(s): HGBA1C in the last 72 hours. CBG:  Recent Labs Lab 09/05/16 2056 09/06/16 0036 09/06/16 0422  09/06/16 0743 09/06/16 1141  GLUCAP 289* 215* 261* 281* 256*   Lipid Profile: No results for input(s): CHOL, HDL, LDLCALC, TRIG, CHOLHDL, LDLDIRECT in the last 72 hours. Thyroid Function Tests: No results for input(s): TSH, T4TOTAL, FREET4, T3FREE, THYROIDAB in the last 72 hours. Anemia Panel: No results for input(s): VITAMINB12, FOLATE, FERRITIN, TIBC, IRON, RETICCTPCT in the last 72 hours. Sepsis Labs: No results for input(s): PROCALCITON, LATICACIDVEN in the last 168 hours.  Recent Results (from the past 240 hour(s))  Blood Culture (routine x 2)     Status: Abnormal   Collection Time: 08/29/16  1:27 AM  Result Value Ref Range Status   Specimen Description BLOOD LEFT ARM  Final   Special Requests BLOOD 5CC  Final   Culture  Setup Time   Final    GRAM POSITIVE COCCI IN CHAINS IN BOTH AEROBIC AND ANAEROBIC BOTTLES CRITICAL RESULT CALLED TO, READ BACK BY AND VERIFIED WITH: J. Arminger Pharm.D. 15:40 08/29/16  ([email or web address])    Culture GROUP B STREP(S.AGALACTIAE)ISOLATED (A)  Final   Report Status 08/31/2016 FINAL  Final   Organism ID, Bacteria GROUP B STREP(S.AGALACTIAE)ISOLATED  Final      Susceptibility   Group b strep(s.agalactiae)isolated - MIC*    CLINDAMYCIN >=1 RESISTANT Resistant     AMPICILLIN <=0.25 SENSITIVE Sensitive     ERYTHROMYCIN >=8 RESISTANT Resistant     VANCOMYCIN 0.5 SENSITIVE Sensitive     CEFTRIAXONE <=0.12 SENSITIVE Sensitive     LEVOFLOXACIN 1 SENSITIVE Sensitive     * GROUP B STREP(S.AGALACTIAE)ISOLATED  Blood Culture ID Panel (Reflexed)     Status: Abnormal   Collection Time: 08/29/16  1:27 AM  Result Value Ref Range Status   Enterococcus species NOT DETECTED NOT DETECTED Final   Listeria monocytogenes NOT DETECTED NOT DETECTED Final   Staphylococcus species NOT DETECTED NOT DETECTED Final   Staphylococcus aureus NOT DETECTED NOT DETECTED Final  Streptococcus species DETECTED (A) NOT DETECTED Final    Comment: CRITICAL RESULT CALLED TO, READ BACK BY  AND VERIFIED WITH: J. Arminger Pharm.D. 15:40 08/29/16 ([email or web address])    Streptococcus agalactiae DETECTED (A) NOT DETECTED Final    Comment: CRITICAL RESULT CALLED TO, READ BACK BY AND VERIFIED WITH: J. Arminger Pharm.D. 15:40 08/29/16 ([email or web address])    Streptococcus pneumoniae NOT DETECTED NOT DETECTED Final   Streptococcus pyogenes NOT DETECTED NOT DETECTED Final   Acinetobacter baumannii NOT DETECTED NOT DETECTED Final   Enterobacteriaceae species NOT DETECTED NOT DETECTED Final   Enterobacter cloacae complex NOT DETECTED NOT DETECTED Final   Escherichia coli NOT DETECTED NOT DETECTED Final   Klebsiella oxytoca NOT DETECTED NOT DETECTED Final   Klebsiella pneumoniae NOT DETECTED NOT DETECTED Final   Proteus species NOT DETECTED NOT DETECTED Final   Serratia marcescens NOT DETECTED NOT DETECTED Final   Haemophilus influenzae NOT DETECTED NOT DETECTED Final   Neisseria meningitidis NOT DETECTED NOT DETECTED Final   Pseudomonas aeruginosa NOT DETECTED NOT DETECTED Final   Candida albicans NOT DETECTED NOT DETECTED Final   Candida glabrata NOT DETECTED NOT DETECTED Final   Candida krusei NOT DETECTED NOT DETECTED Final   Candida parapsilosis NOT DETECTED NOT DETECTED Final   Candida tropicalis NOT DETECTED NOT DETECTED Final  Blood Culture (routine x 2)     Status: Abnormal   Collection Time: 08/29/16  3:18 AM  Result Value Ref Range Status   Specimen Description BLOOD LEFT ARM  Final   Special Requests IN PEDIATRIC BOTTLE 2ML  Final   Culture  Setup Time   Final    GRAM POSITIVE COCCI IN CHAINS IN PEDIATRIC BOTTLE CRITICAL VALUE NOTED.  VALUE IS CONSISTENT WITH PREVIOUSLY REPORTED AND CALLED VALUE.    Culture (A)  Final    GROUP B STREP(S.AGALACTIAE)ISOLATED SUSCEPTIBILITIES PERFORMED ON PREVIOUS CULTURE WITHIN THE LAST 5 DAYS.    Report Status 08/31/2016 FINAL  Final  Urine culture     Status: None   Collection Time: 08/29/16  6:15 PM  Result Value Ref Range Status   Specimen  Description URINE, CATHETERIZED  Final   Special Requests NONE  Final   Culture NO GROWTH  Final   Report Status 08/30/2016 FINAL  Final  MRSA PCR Screening     Status: None   Collection Time: 08/29/16  8:06 PM  Result Value Ref Range Status   MRSA by PCR NEGATIVE NEGATIVE Final    Comment:        The GeneXpert MRSA Assay (FDA approved for NASAL specimens only), is one component of a comprehensive MRSA colonization surveillance program. It is not intended to diagnose MRSA infection nor to guide or monitor treatment for MRSA infections.   Culture, blood (Routine X 2) w Reflex to ID Panel     Status: None   Collection Time: 08/30/16  1:48 PM  Result Value Ref Range Status   Specimen Description BLOOD LEFT ARM  Final   Special Requests IN PEDIATRIC BOTTLE .5CC  Final   Culture NO GROWTH 5 DAYS  Final   Report Status 09/04/2016 FINAL  Final  Culture, blood (Routine X 2) w Reflex to ID Panel     Status: None   Collection Time: 08/30/16  1:48 PM  Result Value Ref Range Status   Specimen Description BLOOD LEFT ARM  Final   Special Requests IN PEDIATRIC BOTTLE [place]  Final   Culture NO GROWTH 5 DAYS  Final   Report Status 09/04/2016 FINAL  Final     Radiology Studies: Dg Chest Port 1 View  Result Date: 09/06/2016 CLINICAL DATA:  Pulmonary edema. EXAM: PORTABLE CHEST 1 VIEW COMPARISON:  08/31/2016 FINDINGS: The cardiac silhouette remains enlarged. Pulmonary vascular congestion and bilateral perihilar and basilar airspace opacities do not appear significantly changed. There are small bilateral pleural effusions, with fluid extending into the right minor fissure. No pneumothorax is seen. IMPRESSION: 1. Unchanged cardiomegaly and bilateral basilar predominant airspace disease compatible with edema. Superimposed infection not excluded in the lung bases. 2. Small pleural effusions. Electronically Signed   By: Logan Bores M.D.   On: 09/06/2016 07:40   Dg Abd Portable 1v  Result Date:  09/06/2016 CLINICAL DATA:  Constipation. EXAM: PORTABLE ABDOMEN - 1 VIEW COMPARISON:  03/05/2016, 02/11/2014.  CT 03/03/2016. FINDINGS: Right femoral catheter noted. Soft tissue structures are unremarkable. Calcification right upper quadrant again noted consist with a gallstone. 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 Barium is noted in the colon. Colonic diverticuli noted. Stool noted throughout the colon. Constipation cannot be excluded. No bowel distention or free air. Postsurgical changes left hip. Degenerative changes lumbar spine and both hips. IMPRESSION: 1. 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 2. Second calcific density noted in the right upper quadrant most consistent with a gallstone. 3. Air is noted throughout the colon. Colonic diverticuli are noted. Stool noted throughout the colon. Constipation cannot be excluded. 4.  Right femoral venous catheter noted. Electronically Signed   By: Poole   On: 09/06/2016 10:50    Scheduled Meds: . amLODipine  10 mg Oral Daily  . atorvastatin  20 mg Oral Daily  . carvedilol  12.5 mg Oral BID WC  . clopidogrel  75 mg Oral Q breakfast  . enoxaparin (LOVENOX) injection  40 mg Subcutaneous Daily  . feeding supplement (GLUCERNA SHAKE)  237 mL Oral TID BM  . furosemide  80 mg Intravenous Q12H  . insulin aspart  0-9 Units Subcutaneous Q4H  . insulin glargine  25 Units Subcutaneous QHS  . isosorbide mononitrate  60 mg Oral Daily  . levofloxacin  750 mg Oral Daily  . mupirocin cream   Topical Daily  . ranolazine  500 mg Oral BID  . sodium chloride flush  10-20 mL Intravenous Q12H   Continuous Infusions:   LOS: 8 days   CHIU, Orpah Melter, MD [facility] Pager [phone]  If 7PM-7AM, please contact night-coverage [email or web address] Password [facility] 09/06/2016, 3:29 PM

## 2016-09-06 NOTE — Progress Notes (Signed)
CSW informed by [employer] that patient is out of Medicare days and has not had a 60 day wellness period for days to start over.  CSW informed pt husband that he would have to either take patient home or pay privately for SNF- husband expressed understanding.  Jorge Ny, LCSW Clinical Social Worker [phone]

## 2016-09-07 LAB — GLUCOSE, CAPILLARY
GLUCOSE-CAPILLARY: 184 mg/dL — AB (ref 65–99)
GLUCOSE-CAPILLARY: 203 mg/dL — AB (ref 65–99)
GLUCOSE-CAPILLARY: 219 mg/dL — AB (ref 65–99)
GLUCOSE-CAPILLARY: 254 mg/dL — AB (ref 65–99)
GLUCOSE-CAPILLARY: 324 mg/dL — AB (ref 65–99)
Glucose-Capillary: 237 mg/dL — ABNORMAL HIGH (ref 65–99)

## 2016-09-07 LAB — BASIC METABOLIC PANEL
Anion gap: 7 (ref 5–15)
BUN: 12 mg/dL (ref 6–20)
CHLORIDE: 94 mmol/L — AB (ref 101–111)
CO2: 35 mmol/L — ABNORMAL HIGH (ref 22–32)
CREATININE: 0.79 mg/dL (ref 0.44–1.00)
Calcium: 9.4 mg/dL (ref 8.9–10.3)
GFR calc Af Amer: >60 mL/min (ref >60)
GFR calc non Af Amer: >60 mL/min (ref >60)
GLUCOSE: 210 mg/dL — AB (ref 65–99)
POTASSIUM: 3.8 mmol/L (ref 3.5–5.1)
SODIUM: 136 mmol/L (ref 135–145)

## 2016-09-07 MED ORDER — INSULIN ASPART 100 UNIT/ML ~~LOC~~ SOLN
3.0000 [IU] | Freq: Three times a day (TID) | SUBCUTANEOUS | Status: DC
  Administered 2016-09-07 – 2016-09-17 (×22): 3 [IU] via SUBCUTANEOUS

## 2016-09-07 MED ORDER — SORBITOL 70 % SOLN
960.0000 mL | TOPICAL_OIL | Freq: Once | ORAL | Status: AC
  Administered 2016-09-07: 960 mL via RECTAL
  Filled 2016-09-07: qty 240

## 2016-09-07 NOTE — Progress Notes (Signed)
Pharmacy Antibiotic Note  [name] is a 76 y.o. female admitted on 08/28/2016 with GBS bacteremia and HCAP.  Pharmacy has been consulted for levofloxacin dosing. Noted PCN allergy. Renal function stable, WBC normal, afebrile.   Plan: -Continue levofloxacin 750mg  PO q24h  -Monitor length of therapy, cultures, and renal function  Height: 5\' 4"  (162.6 cm) Weight: 224 lb 3.3 oz (101.7 kg) IBW/kg (Calculated) : 54.7  Temp (24hrs), Avg:98.4 F (36.9 C), Min:98 F (36.7 C), Max:98.6 F (37 C)   Recent Labs Lab 09/01/16 0415 09/01/16 0610 09/02/16 0432 09/03/16 0500 09/04/16 0550 09/05/16 0430 09/06/16 0510 09/07/16 0449  WBC 14.3*  --  9.0 7.8 9.4 9.5  --   --   CREATININE 0.68  --  0.70 0.69 0.73 0.72 0.72 0.79  VANCOTROUGH  --  13*  --   --   --   --   --   --     Estimated Creatinine Clearance: 72.7 mL/min (by C-G formula based on SCr of 0.79 mg/dL).    Allergies  Allergen Reactions  . Iodine Anaphylaxis  . Penicillins Anaphylaxis    Rec'd cefazolin 02/04/14 pre-op for femur ORIF  . Shellfish Allergy Anaphylaxis  . Shellfish-Derived Products Anaphylaxis  . Sulfa Antibiotics Anaphylaxis  . Sulfacetamide Sodium Anaphylaxis  . Sulfasalazine Anaphylaxis  . Atorvastatin     Other reaction(s): Unknown    Antimicrobials this admission: 1/25 Vanc >> 1/29 1/25 Aztreonam >> 1/26 1/25 Levaquin >>  Dose adjustments this admission: 1/28 VT = 13 > increase vancomycin to 1000mg  q12h  Microbiology results: 1/25 BCx x2: GBS (confirmed by BCID) 1/25 UCx: no growth final 1/26 BCx: NGTD  Argie Ramming, PharmD Pharmacy Resident  Pager [phone] 09/07/16 1:56 PM

## 2016-09-07 NOTE — Progress Notes (Signed)
PROGRESS NOTE    [name]  [id number] DOB: Apr 14, 1941 DOA: 08/28/2016 PCP: [name], MD    Brief Narrative:  Briefly, complex vascular patient with history of PAD, DM, HTN, prior CVA with residual right sided weakness admitted with sepsis due to pneumonia and bacteremia. She was fluid resuscitated on admission due to sepsis, now requiring IV diuresis due to Pulmonary edema and acute on chronic dCHF  Assessment & Plan:   Principal Problem:   Acute encephalopathy Active Problems:   DM2 (diabetes mellitus, type 2) (HCC)   Atherosclerosis of native arteries of the extremities with ulceration (HCC)   Essential hypertension   Ulcer of right lower extremity (HCC)   Sepsis ([place])   Bilateral pneumonia   Bacteremia due to group B Streptococcus   Sepsis due to Group B strep bacteremia - Blood cultures were positive for group B strep, continue Levaquin alone as is sensitive based on antibiogram. She is penicillin allergic. - Surveillance cultures obtained 1/26, no growth to date - Continued on PO levaquin  Acute hypoxic respiratory failure due to HCAP and Pulmonary edema - Likely due to fluid resuscitation in the setting of sepsis. Chest x-ray showed slight pulmonary vascular congestion versus worsening of her pneumonia.  - Pt is continued on IV Lasix as tolerated. Clinically improving, however remains edematous - Still mildly sob and edematous - Tolerating lasix at 80mg  IV bid - Today's wt: 101.7kg - Repeat cxr reviewed. No significant change  Acute on chronic diastolic CHF - Most recent 2-D echo was done roughly 6 months ago on July 2017 and showed an ejection fraction of 55-60% as well as grade 2 diastolic dysfunction.  - Patient now fluid overload in the setting of fluid resuscitation due to sepsis - repeat 2d echo showed normal EF, and grade 1 diastolic dysfunction - continue increased lasix per above  HCAP - due to strep pneumonia - Continue oral abx per  above  Acute encephalopathy  - Appears to be slowly resolving, suspect secondary to #1 and #2  - conversing appropriately  Hypertension  - Continued on Coreg, Norvasc - currently stable at present  Hyperlipidemia  - Will continue statin as tolerated - Stable at present  Type 2 diabetes mellitus  - For now, will continue lantus and sliding scale - Glucose remains in the 200's with lantus at 25 units - Will add 3 units aspart  Right lower extremity ulcer  - wound care was consulted - Stable at present  Peripheral artery disease  - continued on Plavix  - remains stable  Constipation - N/V with abd pain this AM - Xray abd ordered and reviewed - Stool seen throughout colon per my review - No significant results with Mg citrate - Will give trial of SMOG enema  DVT prophylaxis: Lovenox Code Status: Full Family Communication: Pt in room, family not at bedside Disposition Plan: Uncertain at this time  Consultants:     Procedures:   2D echo Impressions: - Normal LV size with moderate LV hypertrophy. EF 60-65%. Normal RVsize and systolic function. Mild to moderate aortic stenosis(moderate visually and by calculated valve area, mild by meangradient).  Antimicrobials: Anti-infectives    Start     Dose/Rate Route Frequency Ordered Stop   09/05/16 1000  levofloxacin (LEVAQUIN) tablet 750 mg     750 mg Oral Daily 09/04/16 1519     09/01/16 0800  vancomycin (VANCOCIN) IVPB 1000 mg/200 mL premix  Status:  Discontinued     1,000 mg 200  mL/hr over 60 Minutes Intravenous Every 12 hours 09/01/16 0731 09/02/16 1110   08/30/16 0800  levofloxacin (LEVAQUIN) IVPB 750 mg  Status:  Discontinued     750 mg 100 mL/hr over 90 Minutes Intravenous Every 24 hours 08/29/16 0545 09/04/16 1519   08/29/16 1800  vancomycin (VANCOCIN) IVPB 750 mg/150 ml premix  Status:  Discontinued     750 mg 150 mL/hr over 60 Minutes Intravenous Every 12 hours 08/29/16 0545 09/01/16 0731   08/29/16  0600  aztreonam (AZACTAM) 2 g in dextrose 5 % 50 mL IVPB  Status:  Discontinued     2 g 100 mL/hr over 30 Minutes Intravenous Every 8 hours 08/29/16 0402 08/29/16 1558   08/29/16 0415  levofloxacin (LEVAQUIN) IVPB 750 mg    Comments:  sepsis   750 mg 100 mL/hr over 90 Minutes Intravenous  Once 08/29/16 0402 08/29/16 0826   08/29/16 0400  aztreonam (AZACTAM) injection 1 g  Status:  Discontinued     1 g Intramuscular Every 8 hours 08/29/16 0312 08/29/16 0539   08/29/16 0330  vancomycin (VANCOCIN) 1,500 mg in sodium chloride 0.9 % 500 mL IVPB     1,500 mg 250 mL/hr over 120 Minutes Intravenous  Once 08/29/16 0304 08/29/16 1019   08/29/16 0315  levofloxacin (LEVAQUIN) tablet 750 mg  Status:  Discontinued     750 mg Oral  Once 08/29/16 0304 08/29/16 0402   08/29/16 0130  vancomycin (VANCOCIN) 1,500 mg in sodium chloride 0.9 % 500 mL IVPB  Status:  Discontinued     1,500 mg 250 mL/hr over 120 Minutes Intravenous  Once 08/29/16 0116 08/29/16 0304   08/29/16 0115  levofloxacin (LEVAQUIN) IVPB 750 mg  Status:  Discontinued     750 mg 100 mL/hr over 90 Minutes Intravenous  Once 08/29/16 0108 08/29/16 0304   08/29/16 0115  aztreonam (AZACTAM) 2 g in dextrose 5 % 50 mL IVPB  Status:  Discontinued     2 g 100 mL/hr over 30 Minutes Intravenous  Once 08/29/16 0108 08/29/16 0312   08/29/16 0115  vancomycin (VANCOCIN) IVPB 1000 mg/200 mL premix  Status:  Discontinued     1,000 mg 200 mL/hr over 60 Minutes Intravenous  Once 08/29/16 0108 08/29/16 0116      Subjective: Complains of feeling "sick"  Objective: Vitals:   09/07/16 0300 09/07/16 0400 09/07/16 0759 09/07/16 1100  BP:  (!) 154/52 (!) 163/68 (!) 119/40  Pulse:  75 70 71  Resp:  20 12 12   Temp:  98.5 F (36.9 C) 98.3 F (36.8 C) 98 F (36.7 C)  TempSrc:  Oral Oral Oral  SpO2:  96% 97% 98%  Weight: 101.7 kg (224 lb 3.3 oz)     Height:        Intake/Output Summary (Last 24 hours) at 09/07/16 1440 Last data filed at 09/07/16 1300   Gross per 24 hour  Intake              120 ml  Output             2100 ml  Net            -1980 ml   Filed Weights   09/04/16 0529 09/06/16 0800 09/07/16 0300  Weight: 99.4 kg (219 lb 2.2 oz) 102.8 kg (226 lb 10.1 oz) 101.7 kg (224 lb 3.3 oz)    Examination:  General exam: Laying in bed, conversant, appears to be uncomfortable Respiratory system: normal chest rise, no wheezing Cardiovascular system:  regular rate, s1-2 on auscultation Gastrointestinal system: distended, generally tender, pos BS Central nervous system: cn2-12 grossly intact, strength intact Extremities: perfused, no clubbing Skin: normal skin turgor, no notable skin lesions seen Psychiatry: mood normal// no visual hallucinations.   Data Reviewed: I have personally reviewed following labs and imaging studies  CBC:  Recent Labs Lab 09/01/16 0415 09/02/16 0432 09/03/16 0500 09/04/16 0550 09/05/16 0430  WBC 14.3* 9.0 7.8 9.4 9.5  HGB 8.8* 8.2* 8.4* 8.8* 8.7*  HCT 28.0* 26.9* 27.1* 28.0* 27.4*  MCV 81.6 83.8 82.1 80.9 81.1  PLT 131* 125* 177 234 [id number]   Basic Metabolic Panel:  Recent Labs Lab 09/03/16 0500 09/04/16 0550 09/05/16 0430 09/06/16 0510 09/07/16 0449  NA 138 139 139 137 136  K 3.6 3.1* 4.1 4.1 3.8  CL 98* 98* 99* 96* 94*  CO2 32 35* 34* 34* 35*  GLUCOSE 196* 184* 206* 276* 210*  BUN 8 7 6 9 12   CREATININE 0.69 0.73 0.72 0.72 0.79  CALCIUM 9.1 9.2 9.0 9.0 9.4   GFR: Estimated Creatinine Clearance: 72.7 mL/min (by C-G formula based on SCr of 0.79 mg/dL). Liver Function Tests: No results for input(s): AST, ALT, ALKPHOS, BILITOT, PROT, ALBUMIN in the last 168 hours. No results for input(s): LIPASE, AMYLASE in the last 168 hours. No results for input(s): AMMONIA in the last 168 hours. Coagulation Profile: No results for input(s): INR, PROTIME in the last 168 hours. Cardiac Enzymes: No results for input(s): CKTOTAL, CKMB, CKMBINDEX, TROPONINI in the last 168 hours. BNP (last 3  results) No results for input(s): PROBNP in the last 8760 hours. HbA1C: No results for input(s): HGBA1C in the last 72 hours. CBG:  Recent Labs Lab 09/06/16 2058 09/07/16 0004 09/07/16 0413 09/07/16 0756 09/07/16 1136  GLUCAP 275* 324* 219* 184* 254*   Lipid Profile: No results for input(s): CHOL, HDL, LDLCALC, TRIG, CHOLHDL, LDLDIRECT in the last 72 hours. Thyroid Function Tests: No results for input(s): TSH, T4TOTAL, FREET4, T3FREE, THYROIDAB in the last 72 hours. Anemia Panel: No results for input(s): VITAMINB12, FOLATE, FERRITIN, TIBC, IRON, RETICCTPCT in the last 72 hours. Sepsis Labs: No results for input(s): PROCALCITON, LATICACIDVEN in the last 168 hours.  Recent Results (from the past 240 hour(s))  Blood Culture (routine x 2)     Status: Abnormal   Collection Time: 08/29/16  1:27 AM  Result Value Ref Range Status   Specimen Description BLOOD LEFT ARM  Final   Special Requests BLOOD 5CC  Final   Culture  Setup Time   Final    GRAM POSITIVE COCCI IN CHAINS IN BOTH AEROBIC AND ANAEROBIC BOTTLES CRITICAL RESULT CALLED TO, READ BACK BY AND VERIFIED WITH: J. Arminger Pharm.D. 15:40 08/29/16  ([email or web address])    Culture GROUP B STREP(S.AGALACTIAE)ISOLATED (A)  Final   Report Status 08/31/2016 FINAL  Final   Organism ID, Bacteria GROUP B STREP(S.AGALACTIAE)ISOLATED  Final      Susceptibility   Group b strep(s.agalactiae)isolated - MIC*    CLINDAMYCIN >=1 RESISTANT Resistant     AMPICILLIN <=0.25 SENSITIVE Sensitive     ERYTHROMYCIN >=8 RESISTANT Resistant     VANCOMYCIN 0.5 SENSITIVE Sensitive     CEFTRIAXONE <=0.12 SENSITIVE Sensitive     LEVOFLOXACIN 1 SENSITIVE Sensitive     * GROUP B STREP(S.AGALACTIAE)ISOLATED  Blood Culture ID Panel (Reflexed)     Status: Abnormal   Collection Time: 08/29/16  1:27 AM  Result Value Ref Range Status   Enterococcus species NOT DETECTED NOT DETECTED Final  Listeria monocytogenes NOT DETECTED NOT DETECTED Final   Staphylococcus species  NOT DETECTED NOT DETECTED Final   Staphylococcus aureus NOT DETECTED NOT DETECTED Final   Streptococcus species DETECTED (A) NOT DETECTED Final    Comment: CRITICAL RESULT CALLED TO, READ BACK BY AND VERIFIED WITH: J. Arminger Pharm.D. 15:40 08/29/16 ([email or web address])    Streptococcus agalactiae DETECTED (A) NOT DETECTED Final    Comment: CRITICAL RESULT CALLED TO, READ BACK BY AND VERIFIED WITH: J. Arminger Pharm.D. 15:40 08/29/16 ([email or web address])    Streptococcus pneumoniae NOT DETECTED NOT DETECTED Final   Streptococcus pyogenes NOT DETECTED NOT DETECTED Final   Acinetobacter baumannii NOT DETECTED NOT DETECTED Final   Enterobacteriaceae species NOT DETECTED NOT DETECTED Final   Enterobacter cloacae complex NOT DETECTED NOT DETECTED Final   Escherichia coli NOT DETECTED NOT DETECTED Final   Klebsiella oxytoca NOT DETECTED NOT DETECTED Final   Klebsiella pneumoniae NOT DETECTED NOT DETECTED Final   Proteus species NOT DETECTED NOT DETECTED Final   Serratia marcescens NOT DETECTED NOT DETECTED Final   Haemophilus influenzae NOT DETECTED NOT DETECTED Final   Neisseria meningitidis NOT DETECTED NOT DETECTED Final   Pseudomonas aeruginosa NOT DETECTED NOT DETECTED Final   Candida albicans NOT DETECTED NOT DETECTED Final   Candida glabrata NOT DETECTED NOT DETECTED Final   Candida krusei NOT DETECTED NOT DETECTED Final   Candida parapsilosis NOT DETECTED NOT DETECTED Final   Candida tropicalis NOT DETECTED NOT DETECTED Final  Blood Culture (routine x 2)     Status: Abnormal   Collection Time: 08/29/16  3:18 AM  Result Value Ref Range Status   Specimen Description BLOOD LEFT ARM  Final   Special Requests IN PEDIATRIC BOTTLE 2ML  Final   Culture  Setup Time   Final    GRAM POSITIVE COCCI IN CHAINS IN PEDIATRIC BOTTLE CRITICAL VALUE NOTED.  VALUE IS CONSISTENT WITH PREVIOUSLY REPORTED AND CALLED VALUE.    Culture (A)  Final    GROUP B STREP(S.AGALACTIAE)ISOLATED SUSCEPTIBILITIES PERFORMED ON  PREVIOUS CULTURE WITHIN THE LAST 5 DAYS.    Report Status 08/31/2016 FINAL  Final  Urine culture     Status: None   Collection Time: 08/29/16  6:15 PM  Result Value Ref Range Status   Specimen Description URINE, CATHETERIZED  Final   Special Requests NONE  Final   Culture NO GROWTH  Final   Report Status 08/30/2016 FINAL  Final  MRSA PCR Screening     Status: None   Collection Time: 08/29/16  8:06 PM  Result Value Ref Range Status   MRSA by PCR NEGATIVE NEGATIVE Final    Comment:        The GeneXpert MRSA Assay (FDA approved for NASAL specimens only), is one component of a comprehensive MRSA colonization surveillance program. It is not intended to diagnose MRSA infection nor to guide or monitor treatment for MRSA infections.   Culture, blood (Routine X 2) w Reflex to ID Panel     Status: None   Collection Time: 08/30/16  1:48 PM  Result Value Ref Range Status   Specimen Description BLOOD LEFT ARM  Final   Special Requests IN PEDIATRIC BOTTLE .5CC  Final   Culture NO GROWTH 5 DAYS  Final   Report Status 09/04/2016 FINAL  Final  Culture, blood (Routine X 2) w Reflex to ID Panel     Status: None   Collection Time: 08/30/16  1:48 PM  Result Value Ref Range Status   Specimen Description BLOOD LEFT ARM  Final   Special Requests IN PEDIATRIC BOTTLE [place]  Final   Culture NO GROWTH 5 DAYS  Final   Report Status 09/04/2016 FINAL  Final     Radiology Studies: Dg Chest Port 1 View  Result Date: 09/06/2016 CLINICAL DATA:  Pulmonary edema. EXAM: PORTABLE CHEST 1 VIEW COMPARISON:  08/31/2016 FINDINGS: The cardiac silhouette remains enlarged. Pulmonary vascular congestion and bilateral perihilar and basilar airspace opacities do not appear significantly changed. There are small bilateral pleural effusions, with fluid extending into the right minor fissure. No pneumothorax is seen. IMPRESSION: 1. Unchanged cardiomegaly and bilateral basilar predominant airspace disease compatible with edema.  Superimposed infection not excluded in the lung bases. 2. Small pleural effusions. Electronically Signed   By: Logan Bores M.D.   On: 09/06/2016 07:40   Dg Abd Portable 1v  Result Date: 09/06/2016 CLINICAL DATA:  Constipation. EXAM: PORTABLE ABDOMEN - 1 VIEW COMPARISON:  03/05/2016, 02/11/2014.  CT 03/03/2016. FINDINGS: Right femoral catheter noted. Soft tissue structures are unremarkable. Calcification right upper quadrant again noted consist with a gallstone. 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 Barium is noted in the colon. Colonic diverticuli noted. Stool noted throughout the colon. Constipation cannot be excluded. No bowel distention or free air. Postsurgical changes left hip. Degenerative changes lumbar spine and both hips. IMPRESSION: 1. 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 2. Second calcific density noted in the right upper quadrant most consistent with a gallstone. 3. Air is noted throughout the colon. Colonic diverticuli are noted. Stool noted throughout the colon. Constipation cannot be excluded. 4.  Right femoral venous catheter noted. Electronically Signed   By: [place]   On: 09/06/2016 10:50    Scheduled Meds: . amLODipine  10 mg Oral Daily  . atorvastatin  20 mg Oral Daily  . carvedilol  12.5 mg Oral BID WC  . clopidogrel  75 mg Oral Q breakfast  . enoxaparin (LOVENOX) injection  40 mg Subcutaneous Daily  . feeding supplement (GLUCERNA SHAKE)  237 mL Oral TID BM  . furosemide  80 mg Intravenous Q12H  . insulin aspart  0-9 Units Subcutaneous Q4H  . insulin glargine  25 Units Subcutaneous QHS  . isosorbide mononitrate  60 mg Oral Daily  . levofloxacin  750 mg Oral Daily  . mupirocin cream   Topical Daily  . ranolazine  500 mg Oral BID  . sodium chloride flush  10-20 mL Intravenous Q12H  . sorbitol, milk of mag, mineral  oil, glycerin (SMOG) enema  960 mL Rectal Once   Continuous Infusions:   LOS: 9 days   Bralon Antkowiak, Orpah Melter, MD [facility] Pager [phone]  If 7PM-7AM, please contact night-coverage [email or web address] Password [email or web address] 09/07/2016, 2:40 PM

## 2016-09-08 LAB — BASIC METABOLIC PANEL
ANION GAP: 11 (ref 5–15)
BUN: 12 mg/dL (ref 6–20)
CALCIUM: 9.2 mg/dL (ref 8.9–10.3)
CHLORIDE: 90 mmol/L — AB (ref 101–111)
CO2: 35 mmol/L — ABNORMAL HIGH (ref 22–32)
Creatinine, Ser: 0.85 mg/dL (ref 0.44–1.00)
GFR calc non Af Amer: >60 mL/min (ref >60)
GLUCOSE: 248 mg/dL — AB (ref 65–99)
Potassium: 4 mmol/L (ref 3.5–5.1)
Sodium: 136 mmol/L (ref 135–145)

## 2016-09-08 LAB — GLUCOSE, CAPILLARY
GLUCOSE-CAPILLARY: 218 mg/dL — AB (ref 65–99)
GLUCOSE-CAPILLARY: 189 mg/dL — AB (ref 65–99)
GLUCOSE-CAPILLARY: 145 mg/dL — AB (ref 65–99)
GLUCOSE-CAPILLARY: 263 mg/dL — AB (ref 65–99)
Glucose-Capillary: 240 mg/dL — ABNORMAL HIGH (ref 65–99)
Glucose-Capillary: 258 mg/dL — ABNORMAL HIGH (ref 65–99)

## 2016-09-08 NOTE — Progress Notes (Signed)
PROGRESS NOTE    [name]  [id number] DOB: 1941/06/25 DOA: 08/28/2016 PCP: [name], MD    Brief Narrative:  Briefly, complex vascular patient with history of PAD, DM, HTN, prior CVA with residual right sided weakness admitted with sepsis due to pneumonia and bacteremia. She was fluid resuscitated on admission due to sepsis, now requiring IV diuresis due to Pulmonary edema and acute on chronic dCHF  Assessment & Plan:   Principal Problem:   Acute encephalopathy Active Problems:   DM2 (diabetes mellitus, type 2) (HCC)   Atherosclerosis of native arteries of the extremities with ulceration (HCC)   Essential hypertension   Ulcer of right lower extremity (HCC)   Sepsis ([place])   Bilateral pneumonia   Bacteremia due to group B Streptococcus   Sepsis due to Group B strep bacteremia - Blood cultures were positive for group B strep, continue Levaquin alone as is sensitive based on antibiogram. She is penicillin allergic. - Surveillance cultures obtained 1/26, no growth to date - Pt is continued on PO levaquin  Acute hypoxic respiratory failure due to HCAP and Pulmonary edema - Likely due to fluid resuscitation in the setting of sepsis. Chest x-ray showed slight pulmonary vascular congestion versus worsening of her pneumonia.  - Pt is continued on IV Lasix as tolerated. Clinically improving, however remains edematous - Still mildly sob and edematous - Tolerating lasix at 80mg  IV bid - Today's wt: 99.3kg - Renal function stable. Reviewed. Will repeat bmet in AM  Acute on chronic diastolic CHF - Most recent 2-D echo was done roughly 6 months ago on July 2017 and showed an ejection fraction of 55-60% as well as grade 2 diastolic dysfunction.  - Patient now fluid overload in the setting of fluid resuscitation due to sepsis - repeat 2d echo showed normal EF, and grade 1 diastolic dysfunction - continue IV lasix per above  HCAP - due to strep pneumonia - Will continue oral  abx per above  Acute encephalopathy  - Appears to be slowly resolving, suspect secondary to #1 and #2  - Pt is conversing appropriately  Hypertension  - Continued on Coreg, Norvasc - Remains stable at present  Hyperlipidemia  - Will continue statin as tolerated - currently stable  Type 2 diabetes mellitus  - For now, will continue lantus and sliding scale - Glucose improved with lantus at 25 units and 3 units premeal aspart  Right lower extremity ulcer  - wound care was consulted - Stable at present  Peripheral artery disease  - continued on Plavix  - remains stable  Constipation - N/V with abd pain this AM - Xray abd ordered and reviewed - Stool seen throughout colon per my review - some results with SMOG - Still mildly distended. Will re-order SMOG  DVT prophylaxis: Lovenox Code Status: Full Family Communication: Pt in room, family not at bedside Disposition Plan: Uncertain at this time  Consultants:     Procedures:   2D echo Impressions: - Normal LV size with moderate LV hypertrophy. EF 60-65%. Normal RVsize and systolic function. Mild to moderate aortic stenosis(moderate visually and by calculated valve area, mild by meangradient).  Antimicrobials: Anti-infectives    Start     Dose/Rate Route Frequency Ordered Stop   09/05/16 1000  levofloxacin (LEVAQUIN) tablet 750 mg     750 mg Oral Daily 09/04/16 1519     09/01/16 0800  vancomycin (VANCOCIN) IVPB 1000 mg/200 mL premix  Status:  Discontinued     1,000 mg  200 mL/hr over 60 Minutes Intravenous Every 12 hours 09/01/16 0731 09/02/16 1110   08/30/16 0800  levofloxacin (LEVAQUIN) IVPB 750 mg  Status:  Discontinued     750 mg 100 mL/hr over 90 Minutes Intravenous Every 24 hours 08/29/16 0545 09/04/16 1519   08/29/16 1800  vancomycin (VANCOCIN) IVPB 750 mg/150 ml premix  Status:  Discontinued     750 mg 150 mL/hr over 60 Minutes Intravenous Every 12 hours 08/29/16 0545 09/01/16 0731   08/29/16 0600   aztreonam (AZACTAM) 2 g in dextrose 5 % 50 mL IVPB  Status:  Discontinued     2 g 100 mL/hr over 30 Minutes Intravenous Every 8 hours 08/29/16 0402 08/29/16 1558   08/29/16 0415  levofloxacin (LEVAQUIN) IVPB 750 mg    Comments:  sepsis   750 mg 100 mL/hr over 90 Minutes Intravenous  Once 08/29/16 0402 08/29/16 0826   08/29/16 0400  aztreonam (AZACTAM) injection 1 g  Status:  Discontinued     1 g Intramuscular Every 8 hours 08/29/16 0312 08/29/16 0539   08/29/16 0330  vancomycin (VANCOCIN) 1,500 mg in sodium chloride 0.9 % 500 mL IVPB     1,500 mg 250 mL/hr over 120 Minutes Intravenous  Once 08/29/16 0304 08/29/16 1019   08/29/16 0315  levofloxacin (LEVAQUIN) tablet 750 mg  Status:  Discontinued     750 mg Oral  Once 08/29/16 0304 08/29/16 0402   08/29/16 0130  vancomycin (VANCOCIN) 1,500 mg in sodium chloride 0.9 % 500 mL IVPB  Status:  Discontinued     1,500 mg 250 mL/hr over 120 Minutes Intravenous  Once 08/29/16 0116 08/29/16 0304   08/29/16 0115  levofloxacin (LEVAQUIN) IVPB 750 mg  Status:  Discontinued     750 mg 100 mL/hr over 90 Minutes Intravenous  Once 08/29/16 0108 08/29/16 0304   08/29/16 0115  aztreonam (AZACTAM) 2 g in dextrose 5 % 50 mL IVPB  Status:  Discontinued     2 g 100 mL/hr over 30 Minutes Intravenous  Once 08/29/16 0108 08/29/16 0312   08/29/16 0115  vancomycin (VANCOCIN) IVPB 1000 mg/200 mL premix  Status:  Discontinued     1,000 mg 200 mL/hr over 60 Minutes Intravenous  Once 08/29/16 0108 08/29/16 0116      Subjective: Still reporting some nausea, however is feeling overall better  Objective: Vitals:   09/08/16 0331 09/08/16 0353 09/08/16 0729 09/08/16 1147  BP: (!) 153/53  (!) 156/51 (!) 140/46  Pulse: 74  70 68  Resp: 20  13 13   Temp: 98 F (36.7 C)  98.4 F (36.9 C) 98 F (36.7 C)  TempSrc: Oral  Oral Oral  SpO2: 98%  100% 100%  Weight:  99.3 kg (218 lb 14.7 oz)    Height:        Intake/Output Summary (Last 24 hours) at 09/08/16 1412 Last  data filed at 09/08/16 0600  Gross per 24 hour  Intake              200 ml  Output             1225 ml  Net            -1025 ml   Filed Weights   09/06/16 0800 09/07/16 0300 09/08/16 0353  Weight: 102.8 kg (226 lb 10.1 oz) 101.7 kg (224 lb 3.3 oz) 99.3 kg (218 lb 14.7 oz)    Examination:  General exam: awake, in nad, conversant Respiratory system: normal resp effort, no audible  wheezing Cardiovascular system: regular rhythm, s1-2 Gastrointestinal system: less distended, pos BS Central nervous system: no seizures, no tremors Extremities: no cyanosis, no joint deformities Skin: no rashes, no pallor Psychiatry: affect normal// no auditory hallucinations.   Data Reviewed: I have personally reviewed following labs and imaging studies  CBC:  Recent Labs Lab 09/02/16 0432 09/03/16 0500 09/04/16 0550 09/05/16 0430  WBC 9.0 7.8 9.4 9.5  HGB 8.2* 8.4* 8.8* 8.7*  HCT 26.9* 27.1* 28.0* 27.4*  MCV 83.8 82.1 80.9 81.1  PLT 125* 177 234 [id number]   Basic Metabolic Panel:  Recent Labs Lab 09/04/16 0550 09/05/16 0430 09/06/16 0510 09/07/16 0449 09/08/16 0225  NA 139 139 137 136 136  K 3.1* 4.1 4.1 3.8 4.0  CL 98* 99* 96* 94* 90*  CO2 35* 34* 34* 35* 35*  GLUCOSE 184* 206* 276* 210* 248*  BUN 7 6 9 12 12   CREATININE 0.73 0.72 0.72 0.79 0.85  CALCIUM 9.2 9.0 9.0 9.4 9.2   GFR: Estimated Creatinine Clearance: 67.5 mL/min (by C-G formula based on SCr of 0.85 mg/dL). Liver Function Tests: No results for input(s): AST, ALT, ALKPHOS, BILITOT, PROT, ALBUMIN in the last 168 hours. No results for input(s): LIPASE, AMYLASE in the last 168 hours. No results for input(s): AMMONIA in the last 168 hours. Coagulation Profile: No results for input(s): INR, PROTIME in the last 168 hours. Cardiac Enzymes: No results for input(s): CKTOTAL, CKMB, CKMBINDEX, TROPONINI in the last 168 hours. BNP (last 3 results) No results for input(s): PROBNP in the last 8760 hours. HbA1C: No results for  input(s): HGBA1C in the last 72 hours. CBG:  Recent Labs Lab 09/07/16 1952 09/08/16 0007 09/08/16 0329 09/08/16 0728 09/08/16 1141  GLUCAP 237* 258* 218* 189* 145*   Lipid Profile: No results for input(s): CHOL, HDL, LDLCALC, TRIG, CHOLHDL, LDLDIRECT in the last 72 hours. Thyroid Function Tests: No results for input(s): TSH, T4TOTAL, FREET4, T3FREE, THYROIDAB in the last 72 hours. Anemia Panel: No results for input(s): VITAMINB12, FOLATE, FERRITIN, TIBC, IRON, RETICCTPCT in the last 72 hours. Sepsis Labs: No results for input(s): PROCALCITON, LATICACIDVEN in the last 168 hours.  Recent Results (from the past 240 hour(s))  Urine culture     Status: None   Collection Time: 08/29/16  6:15 PM  Result Value Ref Range Status   Specimen Description URINE, CATHETERIZED  Final   Special Requests NONE  Final   Culture NO GROWTH  Final   Report Status 08/30/2016 FINAL  Final  MRSA PCR Screening     Status: None   Collection Time: 08/29/16  8:06 PM  Result Value Ref Range Status   MRSA by PCR NEGATIVE NEGATIVE Final    Comment:        The GeneXpert MRSA Assay (FDA approved for NASAL specimens only), is one component of a comprehensive MRSA colonization surveillance program. It is not intended to diagnose MRSA infection nor to guide or monitor treatment for MRSA infections.   Culture, blood (Routine X 2) w Reflex to ID Panel     Status: None   Collection Time: 08/30/16  1:48 PM  Result Value Ref Range Status   Specimen Description BLOOD LEFT ARM  Final   Special Requests IN PEDIATRIC BOTTLE .5CC  Final   Culture NO GROWTH 5 DAYS  Final   Report Status 09/04/2016 FINAL  Final  Culture, blood (Routine X 2) w Reflex to ID Panel     Status: None   Collection Time: 08/30/16  1:48 PM  Result Value Ref Range Status   Specimen Description BLOOD LEFT ARM  Final   Special Requests IN PEDIATRIC BOTTLE [place]  Final   Culture NO GROWTH 5 DAYS  Final   Report Status 09/04/2016 FINAL   Final     Radiology Studies: No results found.  Scheduled Meds: . amLODipine  10 mg Oral Daily  . atorvastatin  20 mg Oral Daily  . carvedilol  12.5 mg Oral BID WC  . clopidogrel  75 mg Oral Q breakfast  . enoxaparin (LOVENOX) injection  40 mg Subcutaneous Daily  . feeding supplement (GLUCERNA SHAKE)  237 mL Oral TID BM  . furosemide  80 mg Intravenous Q12H  . insulin aspart  0-9 Units Subcutaneous Q4H  . insulin aspart  3 Units Subcutaneous TID WC  . insulin glargine  25 Units Subcutaneous QHS  . isosorbide mononitrate  60 mg Oral Daily  . levofloxacin  750 mg Oral Daily  . mupirocin cream   Topical Daily  . ranolazine  500 mg Oral BID  . sodium chloride flush  10-20 mL Intravenous Q12H   Continuous Infusions:   LOS: 10 days   Shyrl Obi, Orpah Melter, MD [facility] Pager [phone]  If 7PM-7AM, please contact night-coverage [email or web address] Password [email or web address] 09/08/2016, 2:12 PM

## 2016-09-09 LAB — GLUCOSE, CAPILLARY
GLUCOSE-CAPILLARY: 180 mg/dL — AB (ref 65–99)
GLUCOSE-CAPILLARY: 190 mg/dL — AB (ref 65–99)
GLUCOSE-CAPILLARY: 191 mg/dL — AB (ref 65–99)
Glucose-Capillary: 190 mg/dL — ABNORMAL HIGH (ref 65–99)
Glucose-Capillary: 245 mg/dL — ABNORMAL HIGH (ref 65–99)
Glucose-Capillary: 230 mg/dL — ABNORMAL HIGH (ref 65–99)

## 2016-09-09 LAB — BASIC METABOLIC PANEL
Anion gap: 7 (ref 5–15)
BUN: 9 mg/dL (ref 6–20)
CALCIUM: 9 mg/dL (ref 8.9–10.3)
CO2: 36 mmol/L — ABNORMAL HIGH (ref 22–32)
Chloride: 91 mmol/L — ABNORMAL LOW (ref 101–111)
Creatinine, Ser: 0.79 mg/dL (ref 0.44–1.00)
GLUCOSE: 187 mg/dL — AB (ref 65–99)
Potassium: 3 mmol/L — ABNORMAL LOW (ref 3.5–5.1)
SODIUM: 134 mmol/L — AB (ref 135–145)

## 2016-09-09 MED ORDER — ADULT MULTIVITAMIN W/MINERALS CH
1.0000 | ORAL_TABLET | Freq: Every day | ORAL | Status: DC
  Administered 2016-09-09 – 2016-09-17 (×8): 1 via ORAL
  Filled 2016-09-09 (×8): qty 1

## 2016-09-09 MED ORDER — CARBAMIDE PEROXIDE 6.5 % OT SOLN
5.0000 [drp] | Freq: Two times a day (BID) | OTIC | Status: DC
  Administered 2016-09-09 – 2016-09-17 (×15): 5 [drp] via OTIC
  Filled 2016-09-09 (×2): qty 15

## 2016-09-09 MED ORDER — MAGNESIUM CITRATE PO SOLN
1.0000 | Freq: Once | ORAL | Status: AC
  Administered 2016-09-09: 1 via ORAL
  Filled 2016-09-09: qty 296

## 2016-09-09 MED ORDER — POTASSIUM CHLORIDE CRYS ER 20 MEQ PO TBCR
40.0000 meq | EXTENDED_RELEASE_TABLET | Freq: Two times a day (BID) | ORAL | Status: AC
  Administered 2016-09-09 (×2): 40 meq via ORAL
  Filled 2016-09-09 (×2): qty 2

## 2016-09-09 NOTE — Progress Notes (Signed)
Nutrition Follow-up  DOCUMENTATION CODES:   Obesity unspecified  INTERVENTION:    Continue Glucerna Shake po TID, each supplement provides 220 kcal and 10 grams of protein  Start MVI daily  NUTRITION DIAGNOSIS:   Inadequate oral intake related to lethargy/confusion as evidenced by meal completion < 50%. Ongoing  GOAL:   Patient will meet greater than or equal to 90% of their needs Progressing  MONITOR:   PO intake, Supplement acceptance, I & O's, Skin  ASSESSMENT:   76 y.o. female admitted on 08/28/2016 with GBS bacteremia and possible PNA.   PO intake of meals continues to be suboptimal. Patient is drinking Glucerna Shakes once or twice daily. Labs and medications reviewed.  Diet Order:  Diet heart healthy/carb modified Room service appropriate? Yes; Fluid consistency: Thin  Skin:  Wound (see comment) (diabetic ulcer to R foot; DTI heel)  Last BM:  2/4  Height:   Ht Readings from Last 1 Encounters:  09/09/16 5\' 4"  (1.626 m)    Weight:   Wt Readings from Last 1 Encounters:  09/09/16 218 lb 9.6 oz (99.2 kg)    Ideal Body Weight:  54.5 kg  BMI:  Body mass index is 37.52 kg/m.  Estimated Nutritional Needs:   Kcal:  1600-1800  Protein:  >/= 100 gm  Fluid:  2 L  EDUCATION NEEDS:   No education needs identified at this time  Molli Barrows, [place], [place], [place] Pager [phone] After Hours Pager [phone]

## 2016-09-09 NOTE — Progress Notes (Signed)
Pt's husband at bedside and requested to speak with Dr. Wyline Copas. For f/u.  MD paged.  Karie Kirks, Therapist, sports.

## 2016-09-09 NOTE — Progress Notes (Signed)
PROGRESS NOTE    [name]  [id number] DOB: 05/28/1941 DOA: 08/28/2016 PCP: [name], MD    Brief Narrative:  Briefly, complex vascular patient with history of PAD, DM, HTN, prior CVA with residual right sided weakness admitted with sepsis due to pneumonia and bacteremia. She was fluid resuscitated on admission due to sepsis, now requiring IV diuresis due to Pulmonary edema and acute on chronic dCHF  Assessment & Plan:   Principal Problem:   Acute encephalopathy Active Problems:   DM2 (diabetes mellitus, type 2) (HCC)   Atherosclerosis of native arteries of the extremities with ulceration (HCC)   Essential hypertension   Ulcer of right lower extremity (HCC)   Sepsis ([place])   Bilateral pneumonia   Bacteremia due to group B Streptococcus   Sepsis due to Group B strep bacteremia - Blood cultures were positive for group B strep, continue Levaquin alone as is sensitive based on antibiogram. She is penicillin allergic. - Surveillance cultures obtained 1/26, no growth to date - For now, patient is continued on PO levaquin  Acute hypoxic respiratory failure due to HCAP and Pulmonary edema - Likely due to fluid resuscitation in the setting of sepsis. Chest x-ray showed slight pulmonary vascular congestion versus worsening of her pneumonia.  - Pt is continued on IV Lasix as tolerated. Clinically improving, however remains edematous - Still mildly sob and edematous - Tolerating lasix at 80mg  IV bid - Wt from 2/4: 99.3kg - Renal function remains stable. Recheck bmet in AM  Acute on chronic diastolic CHF - Most recent 2-D echo was done roughly 6 months ago on July 2017 and showed an ejection fraction of 55-60% as well as grade 2 diastolic dysfunction.  - Patient now fluid overload in the setting of fluid resuscitation due to sepsis - repeat 2d echo showed normal EF, and grade 1 diastolic dysfunction - Will continue 80mg  IV lasix BID per above  HCAP - secondary to strep  pneumonia - Continue oral abx as per above  Acute encephalopathy  - Appears to be slowly resolving, suspect secondary to #1 and #2  - Alert, conversant, appears to interact appropriately  Hypertension  - Continued on Coreg, Norvasc - Currently stable  Hyperlipidemia  - Will continue statin as tolerated - Remains stable  Type 2 diabetes mellitus  - For now, will continue lantus and sliding scale - Continued on lantus at 25 units and 3 units premeal aspart - glucose just under 200 consistently - Will increase lantus to 27 units  Right lower extremity ulcer  - wound care was consulted - currently stable  Peripheral artery disease  - continued on Plavix  - remains stable at present  Constipation - N/V with abd pain this AM - Recent Xray abd ordered and reviewed - Stool seen throughout colon per my review - Results noted with SMOG - Still distended. Will give trial of mg citrate  DVT prophylaxis: Lovenox Code Status: Full Family Communication: Pt in room, family not at bedside Disposition Plan: Uncertain at this time  Consultants:     Procedures:   2D echo Impressions: - Normal LV size with moderate LV hypertrophy. EF 60-65%. Normal RVsize and systolic function. Mild to moderate aortic stenosis(moderate visually and by calculated valve area, mild by meangradient).  Antimicrobials: Anti-infectives    Start     Dose/Rate Route Frequency Ordered Stop   09/05/16 1000  levofloxacin (LEVAQUIN) tablet 750 mg     750 mg Oral Daily 09/04/16 1519  09/01/16 0800  vancomycin (VANCOCIN) IVPB 1000 mg/200 mL premix  Status:  Discontinued     1,000 mg 200 mL/hr over 60 Minutes Intravenous Every 12 hours 09/01/16 0731 09/02/16 1110   08/30/16 0800  levofloxacin (LEVAQUIN) IVPB 750 mg  Status:  Discontinued     750 mg 100 mL/hr over 90 Minutes Intravenous Every 24 hours 08/29/16 0545 09/04/16 1519   08/29/16 1800  vancomycin (VANCOCIN) IVPB 750 mg/150 ml premix   Status:  Discontinued     750 mg 150 mL/hr over 60 Minutes Intravenous Every 12 hours 08/29/16 0545 09/01/16 0731   08/29/16 0600  aztreonam (AZACTAM) 2 g in dextrose 5 % 50 mL IVPB  Status:  Discontinued     2 g 100 mL/hr over 30 Minutes Intravenous Every 8 hours 08/29/16 0402 08/29/16 1558   08/29/16 0415  levofloxacin (LEVAQUIN) IVPB 750 mg    Comments:  sepsis   750 mg 100 mL/hr over 90 Minutes Intravenous  Once 08/29/16 0402 08/29/16 0826   08/29/16 0400  aztreonam (AZACTAM) injection 1 g  Status:  Discontinued     1 g Intramuscular Every 8 hours 08/29/16 0312 08/29/16 0539   08/29/16 0330  vancomycin (VANCOCIN) 1,500 mg in sodium chloride 0.9 % 500 mL IVPB     1,500 mg 250 mL/hr over 120 Minutes Intravenous  Once 08/29/16 0304 08/29/16 1019   08/29/16 0315  levofloxacin (LEVAQUIN) tablet 750 mg  Status:  Discontinued     750 mg Oral  Once 08/29/16 0304 08/29/16 0402   08/29/16 0130  vancomycin (VANCOCIN) 1,500 mg in sodium chloride 0.9 % 500 mL IVPB  Status:  Discontinued     1,500 mg 250 mL/hr over 120 Minutes Intravenous  Once 08/29/16 0116 08/29/16 0304   08/29/16 0115  levofloxacin (LEVAQUIN) IVPB 750 mg  Status:  Discontinued     750 mg 100 mL/hr over 90 Minutes Intravenous  Once 08/29/16 0108 08/29/16 0304   08/29/16 0115  aztreonam (AZACTAM) 2 g in dextrose 5 % 50 mL IVPB  Status:  Discontinued     2 g 100 mL/hr over 30 Minutes Intravenous  Once 08/29/16 0108 08/29/16 0312   08/29/16 0115  vancomycin (VANCOCIN) IVPB 1000 mg/200 mL premix  Status:  Discontinued     1,000 mg 200 mL/hr over 60 Minutes Intravenous  Once 08/29/16 0108 08/29/16 0116      Subjective: Complaining of nausea  Objective: Vitals:   09/09/16 0157 09/09/16 0407 09/09/16 1045 09/09/16 1046  BP:  (!) 167/53 (!) 167/56 (!) 167/56  Pulse: 75 73  70  Resp: 16 10    Temp:  98.2 F (36.8 C)    TempSrc:  Oral    SpO2: 100% 98%    Weight:  99.2 kg (218 lb 9.6 oz)    Height:  5\' 4"  (1.626 m)       Intake/Output Summary (Last 24 hours) at 09/09/16 1422 Last data filed at 09/09/16 0424  Gross per 24 hour  Intake              120 ml  Output             1750 ml  Net            -1630 ml   Filed Weights   09/07/16 0300 09/08/16 0353 09/09/16 0407  Weight: 101.7 kg (224 lb 3.3 oz) 99.3 kg (218 lb 14.7 oz) 99.2 kg (218 lb 9.6 oz)    Examination:  General exam:  laying in bed, conversant, in nad Respiratory system: normal chest rise, no audible wheezing Cardiovascular system: regular rate, s1-s2 on auscultation Gastrointestinal system: mildly distended, decreased BS, nontender Central nervous system: cn2-12 grossly intact, strength intact Extremities: no clubbing, no joint deformities Skin: normal skin turgor, no notable skin lesions seen Psychiatry: mood normal// no visual hallucinations.   Data Reviewed: I have personally reviewed following labs and imaging studies  CBC:  Recent Labs Lab 09/03/16 0500 09/04/16 0550 09/05/16 0430  WBC 7.8 9.4 9.5  HGB 8.4* 8.8* 8.7*  HCT 27.1* 28.0* 27.4*  MCV 82.1 80.9 81.1  PLT 177 234 [id number]   Basic Metabolic Panel:  Recent Labs Lab 09/05/16 0430 09/06/16 0510 09/07/16 0449 09/08/16 0225 09/09/16 0440  NA 139 137 136 136 134*  K 4.1 4.1 3.8 4.0 3.0*  CL 99* 96* 94* 90* 91*  CO2 34* 34* 35* 35* 36*  GLUCOSE 206* 276* 210* 248* 187*  BUN 6 9 12 12 9   CREATININE 0.72 0.72 0.79 0.85 0.79  CALCIUM 9.0 9.0 9.4 9.2 9.0   GFR: Estimated Creatinine Clearance: 71.7 mL/min (by C-G formula based on SCr of 0.79 mg/dL). Liver Function Tests: No results for input(s): AST, ALT, ALKPHOS, BILITOT, PROT, ALBUMIN in the last 168 hours. No results for input(s): LIPASE, AMYLASE in the last 168 hours. No results for input(s): AMMONIA in the last 168 hours. Coagulation Profile: No results for input(s): INR, PROTIME in the last 168 hours. Cardiac Enzymes: No results for input(s): CKTOTAL, CKMB, CKMBINDEX, TROPONINI in the last 168  hours. BNP (last 3 results) No results for input(s): PROBNP in the last 8760 hours. HbA1C: No results for input(s): HGBA1C in the last 72 hours. CBG:  Recent Labs Lab 09/08/16 2040 09/09/16 0026 09/09/16 0406 09/09/16 0750 09/09/16 1147  GLUCAP 240* 190* 180* 191* 190*   Lipid Profile: No results for input(s): CHOL, HDL, LDLCALC, TRIG, CHOLHDL, LDLDIRECT in the last 72 hours. Thyroid Function Tests: No results for input(s): TSH, T4TOTAL, FREET4, T3FREE, THYROIDAB in the last 72 hours. Anemia Panel: No results for input(s): VITAMINB12, FOLATE, FERRITIN, TIBC, IRON, RETICCTPCT in the last 72 hours. Sepsis Labs: No results for input(s): PROCALCITON, LATICACIDVEN in the last 168 hours.  No results found for this or any previous visit (from the past 240 hour(s)).   Radiology Studies: No results found.  Scheduled Meds: . amLODipine  10 mg Oral Daily  . atorvastatin  20 mg Oral Daily  . carbamide peroxide  5 drop Both Ears BID  . carvedilol  12.5 mg Oral BID WC  . clopidogrel  75 mg Oral Q breakfast  . enoxaparin (LOVENOX) injection  40 mg Subcutaneous Daily  . feeding supplement (GLUCERNA SHAKE)  237 mL Oral TID BM  . furosemide  80 mg Intravenous Q12H  . insulin aspart  0-9 Units Subcutaneous Q4H  . insulin aspart  3 Units Subcutaneous TID WC  . insulin glargine  25 Units Subcutaneous QHS  . isosorbide mononitrate  60 mg Oral Daily  . levofloxacin  750 mg Oral Daily  . multivitamin with minerals  1 tablet Oral Daily  . mupirocin cream   Topical Daily  . potassium chloride  40 mEq Oral BID WC  . ranolazine  500 mg Oral BID  . sodium chloride flush  10-20 mL Intravenous Q12H   Continuous Infusions:   LOS: 11 days   Leandre Wien, Orpah Melter, MD [facility] Pager [phone]  If 7PM-7AM, please contact night-coverage [email or web address] Password [email or web address] 09/09/2016, 2:22 PM

## 2016-09-09 NOTE — Progress Notes (Signed)
Pharmacy Antibiotic Note  [name] is a 76 y.o. female admitted on 08/28/2016 with HCAP.  Pharmacy has been consulted for levofloxacin dosing.  Patient's renal function has been stable.  She remains afebrile and her WBC is WNL.   Plan: - Continue levofloxacin 750mg  PO Q24H - Pharmacy will sign off as dosage adjustment is likely unnecessary.  Thank you for the consult! - Consider resuming home meds, supplementing K+ - Abx LOT per MD   Height: 5\' 4"  (162.6 cm) Weight: 218 lb 9.6 oz (99.2 kg) IBW/kg (Calculated) : 54.7  Temp (24hrs), Avg:98.2 F (36.8 C), Min:97.9 F (36.6 C), Max:98.6 F (37 C)   Recent Labs Lab 09/03/16 0500 09/04/16 0550 09/05/16 0430 09/06/16 0510 09/07/16 0449 09/08/16 0225 09/09/16 0440  WBC 7.8 9.4 9.5  --   --   --   --   CREATININE 0.69 0.73 0.72 0.72 0.79 0.85 0.79    Estimated Creatinine Clearance: 71.7 mL/min (by C-G formula based on SCr of 0.79 mg/dL).    Allergies  Allergen Reactions  . Iodine Anaphylaxis  . Penicillins Anaphylaxis    Rec'd cefazolin 02/04/14 pre-op for femur ORIF  . Shellfish Allergy Anaphylaxis  . Shellfish-Derived Products Anaphylaxis  . Sulfa Antibiotics Anaphylaxis  . Sulfacetamide Sodium Anaphylaxis  . Sulfasalazine Anaphylaxis  . Atorvastatin     Other reaction(s): Unknown    Antimicrobials this admission: 1/25 Vanc >> 1/29 1/25 Aztreonam >> 1/26 1/25 Levaquin >>  Dose adjustments this admission: 1/28 VT = 13 > increase vancomycin to 1000mg  q12h  Microbiology results: 1/25 BCx x2: GBS (confirmed by BCID) 1/25 UCx: no growth final 1/26 BCx: negative    Franki Alcaide D. Mina Marble, PharmD, BCPS Pager:  [phone] 09/09/2016, 10:34 AM

## 2016-09-09 NOTE — Care Management Important Message (Signed)
Important Message  Patient Details  Name: [name] MRN: [id number] Date of Birth: [birth year]   Medicare Important Message Given:  Yes    Orbie Pyo 09/09/2016, 4:25 PM

## 2016-09-09 NOTE — Progress Notes (Signed)
Pt stated not able to eat now.  Feels better after zofran given and will call when ready to eat breakfast,.  Karie Kirks, Therapist, sports.

## 2016-09-10 ENCOUNTER — Inpatient Hospital Stay (HOSPITAL_COMMUNITY): Payer: Medicare HMO

## 2016-09-10 LAB — GLUCOSE, CAPILLARY
GLUCOSE-CAPILLARY: 161 mg/dL — AB (ref 65–99)
GLUCOSE-CAPILLARY: 178 mg/dL — AB (ref 65–99)
GLUCOSE-CAPILLARY: 187 mg/dL — AB (ref 65–99)
GLUCOSE-CAPILLARY: 200 mg/dL — AB (ref 65–99)
Glucose-Capillary: 153 mg/dL — ABNORMAL HIGH (ref 65–99)
Glucose-Capillary: 195 mg/dL — ABNORMAL HIGH (ref 65–99)

## 2016-09-10 LAB — BASIC METABOLIC PANEL
Anion gap: 14 (ref 5–15)
BUN: 10 mg/dL (ref 6–20)
CHLORIDE: 91 mmol/L — AB (ref 101–111)
CO2: 29 mmol/L (ref 22–32)
Calcium: 9.3 mg/dL (ref 8.9–10.3)
Creatinine, Ser: 0.88 mg/dL (ref 0.44–1.00)
GFR calc Af Amer: >60 mL/min (ref >60)
GFR calc non Af Amer: >60 mL/min (ref >60)
GLUCOSE: 155 mg/dL — AB (ref 65–99)
POTASSIUM: 4.2 mmol/L (ref 3.5–5.1)
Sodium: 134 mmol/L — ABNORMAL LOW (ref 135–145)

## 2016-09-10 MED ORDER — TRAMADOL HCL 50 MG PO TABS
50.0000 mg | ORAL_TABLET | Freq: Four times a day (QID) | ORAL | Status: DC | PRN
  Administered 2016-09-10 – 2016-09-17 (×10): 50 mg via ORAL
  Filled 2016-09-10 (×11): qty 1

## 2016-09-10 MED ORDER — PROCHLORPERAZINE EDISYLATE 5 MG/ML IJ SOLN
10.0000 mg | INTRAMUSCULAR | Status: DC | PRN
  Administered 2016-09-10 – 2016-09-17 (×7): 10 mg via INTRAVENOUS
  Filled 2016-09-10 (×7): qty 2

## 2016-09-10 MED ORDER — SALINE SPRAY 0.65 % NA SOLN
1.0000 | NASAL | Status: DC | PRN
  Filled 2016-09-10: qty 44

## 2016-09-10 MED ORDER — INSULIN GLARGINE 100 UNIT/ML ~~LOC~~ SOLN
27.0000 [IU] | Freq: Every day | SUBCUTANEOUS | Status: DC
  Administered 2016-09-10 – 2016-09-16 (×7): 27 [IU] via SUBCUTANEOUS
  Filled 2016-09-10 (×8): qty 0.27

## 2016-09-10 NOTE — Progress Notes (Signed)
Physical Therapy Treatment Patient Details Name: [name] MRN: [id number] DOB: 1941/01/25 Today's Date: 09/10/2016    History of Present Illness 76 YO F admitted with encephalopathy. PMH includes HTN, DM, diastolic acute CHF, PAD, RLE BPG, CVA, aortic stenosis, neuropathy, edema.     PT Comments    Pt with limited participation throughout session.  Pt was able to come to sitting at EOB with extensive 2 person A and cues for encouragement.  Pt indicates feeling dizzy while sitting and systolic BP found to be in 90s.  Pt assisted back to bed and placed in chair position in bed to facilitate more upright positioning.  During session pt asked for a drink and wanted PT to bring it to her mouth for her, however PT educated pt about importance of doing as much as she can forherself and then pt able to drink without A.  Will continue to follow.    Follow Up Recommendations  SNF     Equipment Recommendations  None recommended by PT    Recommendations for Other Services       Precautions / Restrictions Precautions Precautions: Fall Restrictions Weight Bearing Restrictions: No    Mobility  Bed Mobility Overal bed mobility: Needs Assistance Bed Mobility: Supine to Sit;Sit to Supine;Rolling Rolling: Mod assist   Supine to sit: Max assist;+2 for physical assistance;HOB elevated Sit to supine: Max assist;+2 for physical assistance   General bed mobility comments: cues throughout for technique and participation, however pt with minimal participation.  pt c/o dizziness while sitting EOB and systolic BP found to be in 90s.    Transfers                    Ambulation/Gait                 Stairs            Wheelchair Mobility    Modified Rankin (Stroke Patients Only)       Balance Overall balance assessment: Needs assistance Sitting-balance support: Single extremity supported;Bilateral upper extremity supported;No upper extremity supported;Feet  supported Sitting balance-Leahy Scale: Fair Sitting balance - Comments: While sitting EOB pt c/o feeling dizzy and systolic BP found to be in 90s.                              Cognition Arousal/Alertness: Awake/alert Behavior During Therapy: Flat affect Overall Cognitive Status: Impaired/Different from baseline Area of Impairment: Orientation;Attention;Memory;Following commands;Safety/judgement;Awareness;Problem solving Orientation Level: Situation Current Attention Level: Selective Memory: Decreased short-term memory Following Commands: Follows one step commands inconsistently;Follows one step commands with increased time Safety/Judgement: Decreased awareness of deficits;Decreased awareness of safety Awareness: Intellectual Problem Solving: Slow processing;Difficulty sequencing;Requires verbal cues;Requires tactile cues      Exercises      General Comments        Pertinent Vitals/Pain Pain Assessment: Faces Faces Pain Scale: Hurts even more Pain Location: pt did not give location, but kept stating she is sick. Pain Descriptors / Indicators: Grimacing;Moaning Pain Intervention(s): Monitored during session;Repositioned    Home Living                      Prior Function            PT Goals (current goals can now be found in the care plan section) Acute Rehab PT Goals Patient Stated Goal: not expressed PT Goal Formulation: With patient/family Time For Goal Achievement: 09/14/16  Potential to Achieve Goals: Fair Progress towards PT goals: Progressing toward goals    Frequency    Min 2X/week      PT Plan Current plan remains appropriate    Co-evaluation             End of Session   Activity Tolerance: Patient limited by fatigue;Patient limited by pain Patient left: in bed;with call bell/phone within reach;with bed alarm set     Time: 1010-1035 PT Time Calculation (min) (ACUTE ONLY): 25 min  Charges:  $Therapeutic Activity: 23-37  mins                    G CodesCatarina Hartshorn, [place] [phone] 09/10/2016, 1:49 PM

## 2016-09-10 NOTE — Clinical Social Work Note (Signed)
Clinical Social Worker spoke briefly with patient daughter, [name], over the phone to further discuss patient discharge disposition.  Patient daughter states that she communicated with insurance and they claim that patient can still receive authorization for SNF.  CSW has left a message with [employer] representative to clarify patient options.  CSW remains available for support and to facilitate patient discharge needs once medically ready.  Barbette Or, [place]

## 2016-09-10 NOTE — Progress Notes (Signed)
Patient has refused to be turned or repositioned by staff multiple times during the day, patient was educated on the importance of frequent repositioning to prevent pressure ulcers or skin breakdown.  Oanh Devivo, Tivis Ringer, RN

## 2016-09-10 NOTE — Progress Notes (Signed)
PROGRESS NOTE    [name]  [id number] DOB: 1941-01-12 DOA: 08/28/2016 PCP: [name], MD    Brief Narrative:  Briefly, complex vascular patient with history of PAD, DM, HTN, prior CVA with residual right sided weakness admitted with sepsis due to pneumonia and bacteremia. She was fluid resuscitated on admission due to sepsis, now requiring IV diuresis due to Pulmonary edema and acute on chronic dCHF  Assessment & Plan:   Principal Problem:   Acute encephalopathy Active Problems:   DM2 (diabetes mellitus, type 2) (HCC)   Atherosclerosis of native arteries of the extremities with ulceration (HCC)   Essential hypertension   Ulcer of right lower extremity (HCC)   Sepsis ([place])   Bilateral pneumonia   Bacteremia due to group B Streptococcus   Sepsis due to Group B strep bacteremia - Blood cultures were positive for group B strep, continue Levaquin alone as is sensitive based on antibiogram. She is penicillin allergic. - Surveillance cultures obtained 1/26, no growth to date - Patient remains on PO levaquin  Acute hypoxic respiratory failure due to HCAP and Pulmonary edema - Likely due to fluid resuscitation in the setting of sepsis. Chest x-ray showed slight pulmonary vascular congestion versus worsening of her pneumonia.  - Pt is continued on IV Lasix as tolerated. Clinically improving, however remains edematous - Still mildly sob and edematous - Continued on lasix at 80mg  IV bid - Wt from 2/4: 99.3kg - Wt today: 95.7kg - Will repeat renal panel in AM  Acute on chronic diastolic CHF - Most recent 2-D echo was done roughly 6 months ago on July 2017 and showed an ejection fraction of 55-60% as well as grade 2 diastolic dysfunction.  - Patient now fluid overload in the setting of fluid resuscitation due to sepsis - repeat 2d echo showed normal EF, and grade 1 diastolic dysfunction - For now, will continue 80mg  IV lasix BID per above  HCAP - secondary to strep  pneumonia - Will continue on oral abx as per above  Acute encephalopathy  - Appears to be slowly resolving, suspect secondary to #1 and #2  - Alert, conversant, appears to interact appropriately - Stable  Hypertension  - Continued on Coreg, Norvasc - Remains stable at present  Hyperlipidemia  - Will continue statin as tolerated - Currently stable  Type 2 diabetes mellitus  - For now, will continue lantus and sliding scale - Continued on lantus at 25 units and 3 units premeal aspart - glucose remains in the mid-100's - Will increase lantus to 27 units  Right lower extremity ulcer  - wound care was consulted - remains stable  Peripheral artery disease  - Patient is continued on Plavix  - Currently remains stable  Constipation - N/V with abd pain this AM - Recent Xray abd ordered and reviewed - Stool seen throughout colon per my review - Results are noted with SMOG - improved with SMOG and mg citrate - Pt continues to complain of nausea.  Abd xray reviewed. No significant stool burden noted. - Will check cmp in AM  DVT prophylaxis: Lovenox Code Status: Full Family Communication: Pt in room, family not at bedside Disposition Plan: Uncertain at this time  Consultants:     Procedures:   2D echo Impressions: - Normal LV size with moderate LV hypertrophy. EF 60-65%. Normal RVsize and systolic function. Mild to moderate aortic stenosis(moderate visually and by calculated valve area, mild by meangradient).  Antimicrobials: Anti-infectives    Start  Dose/Rate Route Frequency Ordered Stop   09/05/16 1000  levofloxacin (LEVAQUIN) tablet 750 mg     750 mg Oral Daily 09/04/16 1519     09/01/16 0800  vancomycin (VANCOCIN) IVPB 1000 mg/200 mL premix  Status:  Discontinued     1,000 mg 200 mL/hr over 60 Minutes Intravenous Every 12 hours 09/01/16 0731 09/02/16 1110   08/30/16 0800  levofloxacin (LEVAQUIN) IVPB 750 mg  Status:  Discontinued     750 mg 100  mL/hr over 90 Minutes Intravenous Every 24 hours 08/29/16 0545 09/04/16 1519   08/29/16 1800  vancomycin (VANCOCIN) IVPB 750 mg/150 ml premix  Status:  Discontinued     750 mg 150 mL/hr over 60 Minutes Intravenous Every 12 hours 08/29/16 0545 09/01/16 0731   08/29/16 0600  aztreonam (AZACTAM) 2 g in dextrose 5 % 50 mL IVPB  Status:  Discontinued     2 g 100 mL/hr over 30 Minutes Intravenous Every 8 hours 08/29/16 0402 08/29/16 1558   08/29/16 0415  levofloxacin (LEVAQUIN) IVPB 750 mg    Comments:  sepsis   750 mg 100 mL/hr over 90 Minutes Intravenous  Once 08/29/16 0402 08/29/16 0826   08/29/16 0400  aztreonam (AZACTAM) injection 1 g  Status:  Discontinued     1 g Intramuscular Every 8 hours 08/29/16 0312 08/29/16 0539   08/29/16 0330  vancomycin (VANCOCIN) 1,500 mg in sodium chloride 0.9 % 500 mL IVPB     1,500 mg 250 mL/hr over 120 Minutes Intravenous  Once 08/29/16 0304 08/29/16 1019   08/29/16 0315  levofloxacin (LEVAQUIN) tablet 750 mg  Status:  Discontinued     750 mg Oral  Once 08/29/16 0304 08/29/16 0402   08/29/16 0130  vancomycin (VANCOCIN) 1,500 mg in sodium chloride 0.9 % 500 mL IVPB  Status:  Discontinued     1,500 mg 250 mL/hr over 120 Minutes Intravenous  Once 08/29/16 0116 08/29/16 0304   08/29/16 0115  levofloxacin (LEVAQUIN) IVPB 750 mg  Status:  Discontinued     750 mg 100 mL/hr over 90 Minutes Intravenous  Once 08/29/16 0108 08/29/16 0304   08/29/16 0115  aztreonam (AZACTAM) 2 g in dextrose 5 % 50 mL IVPB  Status:  Discontinued     2 g 100 mL/hr over 30 Minutes Intravenous  Once 08/29/16 0108 08/29/16 0312   08/29/16 0115  vancomycin (VANCOCIN) IVPB 1000 mg/200 mL premix  Status:  Discontinued     1,000 mg 200 mL/hr over 60 Minutes Intravenous  Once 08/29/16 0108 08/29/16 0116      Subjective: Still complaining of nausea  Objective: Vitals:   09/10/16 1100 09/10/16 1200 09/10/16 1300 09/10/16 1400  BP:    (!) 128/42  Pulse: 71 72 67 68  Resp: 15 16 15 15     Temp:    97.7 F (36.5 C)  TempSrc:    Oral  SpO2: 94% 95% 95% 96%  Weight:      Height:        Intake/Output Summary (Last 24 hours) at 09/10/16 1629 Last data filed at 09/10/16 1400  Gross per 24 hour  Intake              592 ml  Output             1875 ml  Net            -1283 ml   Filed Weights   09/08/16 0353 09/09/16 0407 09/10/16 0615  Weight: 99.3 kg (218 lb  14.7 oz) 99.2 kg (218 lb 9.6 oz) 95.7 kg (211 lb)    Examination:  General exam: in nad, awake, conversant Respiratory system: normal resp effort, no audible wheezing Cardiovascular system: regular rhythm, s1-s2 Gastrointestinal system: mildly distended, pos BS, nondistended Central nervous system: no seizures, no tremors Extremities: perfused, no clubbing Skin: no rashes//no pallor Psychiatry: affect normal// no auditory hallucinations.   Data Reviewed: I have personally reviewed following labs and imaging studies  CBC:  Recent Labs Lab 09/04/16 0550 09/05/16 0430  WBC 9.4 9.5  HGB 8.8* 8.7*  HCT 28.0* 27.4*  MCV 80.9 81.1  PLT 234 [id number]   Basic Metabolic Panel:  Recent Labs Lab 09/06/16 0510 09/07/16 0449 09/08/16 0225 09/09/16 0440 09/10/16 0845  NA 137 136 136 134* 134*  K 4.1 3.8 4.0 3.0* 4.2  CL 96* 94* 90* 91* 91*  CO2 34* 35* 35* 36* 29  GLUCOSE 276* 210* 248* 187* 155*  BUN 9 12 12 9 10   CREATININE 0.72 0.79 0.85 0.79 0.88  CALCIUM 9.0 9.4 9.2 9.0 9.3   GFR: Estimated Creatinine Clearance: 63.9 mL/min (by C-G formula based on SCr of 0.88 mg/dL). Liver Function Tests: No results for input(s): AST, ALT, ALKPHOS, BILITOT, PROT, ALBUMIN in the last 168 hours. No results for input(s): LIPASE, AMYLASE in the last 168 hours. No results for input(s): AMMONIA in the last 168 hours. Coagulation Profile: No results for input(s): INR, PROTIME in the last 168 hours. Cardiac Enzymes: No results for input(s): CKTOTAL, CKMB, CKMBINDEX, TROPONINI in the last 168 hours. BNP (last 3  results) No results for input(s): PROBNP in the last 8760 hours. HbA1C: No results for input(s): HGBA1C in the last 72 hours. CBG:  Recent Labs Lab 09/10/16 0017 09/10/16 0506 09/10/16 0747 09/10/16 1139 09/10/16 1625  GLUCAP 195* 153* 161* 178* 187*   Lipid Profile: No results for input(s): CHOL, HDL, LDLCALC, TRIG, CHOLHDL, LDLDIRECT in the last 72 hours. Thyroid Function Tests: No results for input(s): TSH, T4TOTAL, FREET4, T3FREE, THYROIDAB in the last 72 hours. Anemia Panel: No results for input(s): VITAMINB12, FOLATE, FERRITIN, TIBC, IRON, RETICCTPCT in the last 72 hours. Sepsis Labs: No results for input(s): PROCALCITON, LATICACIDVEN in the last 168 hours.  No results found for this or any previous visit (from the past 240 hour(s)).   Radiology Studies: Dg Abd Portable 1v  Result Date: 09/10/2016 CLINICAL DATA:  Constipated and vomiting. EXAM: PORTABLE ABDOMEN - 1 VIEW COMPARISON:  None. FINDINGS: The bowel gas pattern is normal. No radio-opaque calculi or other significant radiographic abnormality are seen. IMPRESSION: Negative one view abdomen. Electronically Signed   By: San Morelle M.D.   On: 09/10/2016 10:09    Scheduled Meds: . amLODipine  10 mg Oral Daily  . atorvastatin  20 mg Oral Daily  . carbamide peroxide  5 drop Both Ears BID  . carvedilol  12.5 mg Oral BID WC  . clopidogrel  75 mg Oral Q breakfast  . enoxaparin (LOVENOX) injection  40 mg Subcutaneous Daily  . feeding supplement (GLUCERNA SHAKE)  237 mL Oral TID BM  . furosemide  80 mg Intravenous Q12H  . insulin aspart  0-9 Units Subcutaneous Q4H  . insulin aspart  3 Units Subcutaneous TID WC  . insulin glargine  25 Units Subcutaneous QHS  . isosorbide mononitrate  60 mg Oral Daily  . levofloxacin  750 mg Oral Daily  . multivitamin with minerals  1 tablet Oral Daily  . mupirocin cream   Topical Daily  .  ranolazine  500 mg Oral BID  . sodium chloride flush  10-20 mL Intravenous Q12H    Continuous Infusions:   LOS: 12 days   Maanav Kassabian, Orpah Melter, MD [facility] Pager [phone]  If 7PM-7AM, please contact night-coverage [email or web address] Password [email or web address] 09/10/2016, 4:29 PM

## 2016-09-11 DIAGNOSIS — Z888 Allergy status to other drugs, medicaments and biological substances status: Secondary | ICD-10-CM

## 2016-09-11 DIAGNOSIS — G9340 Encephalopathy, unspecified: Secondary | ICD-10-CM

## 2016-09-11 DIAGNOSIS — Z95828 Presence of other vascular implants and grafts: Secondary | ICD-10-CM

## 2016-09-11 DIAGNOSIS — R4182 Altered mental status, unspecified: Secondary | ICD-10-CM

## 2016-09-11 DIAGNOSIS — Z881 Allergy status to other antibiotic agents status: Secondary | ICD-10-CM

## 2016-09-11 DIAGNOSIS — Z882 Allergy status to sulfonamides status: Secondary | ICD-10-CM

## 2016-09-11 DIAGNOSIS — L97819 Non-pressure chronic ulcer of other part of right lower leg with unspecified severity: Secondary | ICD-10-CM

## 2016-09-11 DIAGNOSIS — Z91013 Allergy to seafood: Secondary | ICD-10-CM

## 2016-09-11 DIAGNOSIS — E1151 Type 2 diabetes mellitus with diabetic peripheral angiopathy without gangrene: Secondary | ICD-10-CM

## 2016-09-11 DIAGNOSIS — E11622 Type 2 diabetes mellitus with other skin ulcer: Secondary | ICD-10-CM

## 2016-09-11 DIAGNOSIS — Z880 Allergy status to penicillin: Secondary | ICD-10-CM

## 2016-09-11 DIAGNOSIS — E1140 Type 2 diabetes mellitus with diabetic neuropathy, unspecified: Secondary | ICD-10-CM

## 2016-09-11 DIAGNOSIS — I7025 Atherosclerosis of native arteries of other extremities with ulceration: Secondary | ICD-10-CM

## 2016-09-11 DIAGNOSIS — R011 Cardiac murmur, unspecified: Secondary | ICD-10-CM

## 2016-09-11 DIAGNOSIS — Z8673 Personal history of transient ischemic attack (TIA), and cerebral infarction without residual deficits: Secondary | ICD-10-CM

## 2016-09-11 DIAGNOSIS — Z833 Family history of diabetes mellitus: Secondary | ICD-10-CM

## 2016-09-11 LAB — GLUCOSE, CAPILLARY
GLUCOSE-CAPILLARY: 181 mg/dL — AB (ref 65–99)
Glucose-Capillary: 129 mg/dL — ABNORMAL HIGH (ref 65–99)
Glucose-Capillary: 176 mg/dL — ABNORMAL HIGH (ref 65–99)
Glucose-Capillary: 250 mg/dL — ABNORMAL HIGH (ref 65–99)
Glucose-Capillary: 189 mg/dL — ABNORMAL HIGH (ref 65–99)
Glucose-Capillary: 163 mg/dL — ABNORMAL HIGH (ref 65–99)

## 2016-09-11 LAB — BASIC METABOLIC PANEL
Anion gap: 11 (ref 5–15)
BUN: 9 mg/dL (ref 6–20)
CALCIUM: 9 mg/dL (ref 8.9–10.3)
CO2: 34 mmol/L — ABNORMAL HIGH (ref 22–32)
Chloride: 87 mmol/L — ABNORMAL LOW (ref 101–111)
Creatinine, Ser: 0.85 mg/dL (ref 0.44–1.00)
GFR calc Af Amer: >60 mL/min (ref >60)
Glucose, Bld: 158 mg/dL — ABNORMAL HIGH (ref 65–99)
POTASSIUM: 3 mmol/L — AB (ref 3.5–5.1)
SODIUM: 132 mmol/L — AB (ref 135–145)

## 2016-09-11 LAB — MAGNESIUM: Magnesium: 2.2 mg/dL (ref 1.7–2.4)

## 2016-09-11 MED ORDER — POTASSIUM CHLORIDE CRYS ER 20 MEQ PO TBCR
40.0000 meq | EXTENDED_RELEASE_TABLET | Freq: Two times a day (BID) | ORAL | Status: AC
  Administered 2016-09-11 (×2): 40 meq via ORAL
  Filled 2016-09-11 (×2): qty 2

## 2016-09-11 MED ORDER — FUROSEMIDE 40 MG PO TABS
40.0000 mg | ORAL_TABLET | Freq: Two times a day (BID) | ORAL | Status: DC
  Administered 2016-09-11 – 2016-09-15 (×8): 40 mg via ORAL
  Filled 2016-09-11 (×9): qty 1

## 2016-09-11 NOTE — Clinical Social Work Note (Signed)
Clinical Social Worker continuing to follow patient and family for support and discharge planning needs.  CSW spoke with Eben Burow from [facility] who states that patient does have eligible days and is managed through [facility] [place].  Clinical information sent to insurance provider and awaiting authorization for [employer].  CSW called and updated patient daughter on new information and plans for possible discharge tomorrow.  Patient expressed appreciation and states that she will update patient and patient spouse regarding plans.  CSW remains available for support and to facilitate patient discharge needs once medically stable.  Barbette Or, [place]

## 2016-09-11 NOTE — Consult Note (Signed)
Date of Admission:  08/28/2016  Date of Consult:  09/11/2016  Reason for Consult: GBS bacteremia Referring Physician: Dr. Allyson Sabal   HPI: [name] is an 76 y.o. 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 She was febrile to T102F and treated initially with vancomycin and aztreonam given her penicillin allergy, which she describes as "swelling all over," but was transitioned to levofloxacin monotherapy with pan-sensitive Group B Strep on blood cultures. TTE 1/29 did not comment on any vegetations though noted severe calcifications of her aortic trileaflets. Repeat blood cultures have been without growth.  She cannot recall the details prior to her admission, not surprisingly, but denies any fever, chills, cough though has felt mildly short of breath, constipated, nausea with vomiting twice yesterday, and possibly dysuria. She has chronic right lower extremity wounds, and ABIs 12/28/15 noted severe tibial disease bilaterally for which she underwent bypass grafting on 02/28/16 and then developed subsequent ischemic infarcts of the left basal ganglia and left internal capsule as noted on brain MRI 03/02/16. At baseline, she is mostly sedentary at home with her husband.   Past Medical History:  Diagnosis Date  . Acute heart failure ([place])   . Aortic stenosis   . Complication of anesthesia    Hard to wake patient up after having anesthesia  . Diabetes mellitus without complication ([place])   . Diabetic neuropathy (HCC)    Takes Lyrica  . Edema of both legs    Takes Lasix  . GERD (gastroesophageal reflux disease)   . High cholesterol   . HTN (hypertension)   . Hypertension   . PAD (peripheral artery disease) ([place])   . Shortness of breath dyspnea   . Spasm of back muscles   . Stroke ([place])   . Wound, open    Right great toe     Past Surgical History:  Procedure Laterality Date  . BYPASS GRAFT POPLITEAL TO TIBIAL Right 02/28/2016   Procedure: BYPASS GRAFT RIGHT BELOW KNEE POPLITEAL TO PERONEAL USING REVERSED RIGHT GREATER SAPHENOUS VEIN;  Surgeon: Conrad Falling [place], MD;  Location: [facility];  Service: Vascular;  Laterality: Right;  . CYST EXCISION     Abdomen  . EYE SURGERY Bilateral    Cataract removal  . INTRAMEDULLARY (IM) NAIL INTERTROCHANTERIC Left 02/04/2014   Procedure: INTRAMEDULLARY (IM) NAIL INTERTROCHANTRIC FEMORAL;  Surgeon: Mauri Pole, MD;  Location: [facility];  Service: Orthopedics;  Laterality: Left;  . IR GENERIC HISTORICAL  03/01/2016   IR ANGIO INTRA EXTRACRAN SEL COM CAROTID INNOMINATE UNI R MOD SED 03/01/2016 [name], MD [facility]  . IR GENERIC HISTORICAL  03/01/2016   IR ENDOVASC INTRACRANIAL INF OTHER THAN THROMBO ART INC DIAG ANGIO 03/01/2016 [name], MD [facility]  . IR GENERIC HISTORICAL  03/01/2016   IR INTRAVSC STENT CERV CAROTID W/O EMB-PROT MOD SED INC ANGIO 03/01/2016 [name], MD [facility]  . IR GENERIC HISTORICAL  06/03/2016   IR RADIOLOGIST EVAL & MGMT 06/03/2016 [facility]  . ORIF TOE FRACTURE Right 02/08/2014   Procedure: OPEN REDUCTION INTERNAL FIXATION Right METATARSAL  FRACTURE ;  Surgeon: Wylene Simmer, MD;  Location: [facility];  Service: Orthopedics;  Laterality: Right;  . PERIPHERAL VASCULAR CATHETERIZATION N/A 12/28/2015   Procedure: Abdominal Aortogram w/Lower Extremity;  Surgeon: Conrad Whiteville, MD;  Location: [facility];  Service: Cardiovascular;  Laterality: N/A;  .  RADIOLOGY WITH ANESTHESIA N/A 03/01/2016   Procedure: RADIOLOGY WITH ANESTHESIA;  Surgeon: [name], MD;  Location: [facility];  Service: Radiology;  Laterality: N/A;  . VEIN HARVEST Right 02/28/2016   Procedure: RIGHT GREATER SAPHENOUS VEIN HARVEST;  Surgeon: Conrad Littleton, MD;  Location: [facility];  Service: Vascular;  Laterality: Right;    Social History:  reports that she has  never smoked. She has never used smokeless tobacco. She reports that she does not drink alcohol or use drugs.   Family History  Problem Relation Age of Onset  . Diabetes Other     Allergies  Allergen Reactions  . Iodine Anaphylaxis  . Penicillins Anaphylaxis    Rec'd cefazolin 02/04/14 pre-op for femur ORIF  . Shellfish Allergy Anaphylaxis  . Shellfish-Derived Products Anaphylaxis  . Sulfa Antibiotics Anaphylaxis  . Sulfacetamide Sodium Anaphylaxis  . Sulfasalazine Anaphylaxis  . Atorvastatin     Other reaction(s): Unknown     Medications: I have reviewed patients current medications as documented in Epic Anti-infectives    Start     Dose/Rate Route Frequency Ordered Stop   09/05/16 1000  levofloxacin (LEVAQUIN) tablet 750 mg     750 mg Oral Daily 09/04/16 1519     09/01/16 0800  vancomycin (VANCOCIN) IVPB 1000 mg/200 mL premix  Status:  Discontinued     1,000 mg 200 mL/hr over 60 Minutes Intravenous Every 12 hours 09/01/16 0731 09/02/16 1110   08/30/16 0800  levofloxacin (LEVAQUIN) IVPB 750 mg  Status:  Discontinued     750 mg 100 mL/hr over 90 Minutes Intravenous Every 24 hours 08/29/16 0545 09/04/16 1519   08/29/16 1800  vancomycin (VANCOCIN) IVPB 750 mg/150 ml premix  Status:  Discontinued     750 mg 150 mL/hr over 60 Minutes Intravenous Every 12 hours 08/29/16 0545 09/01/16 0731   08/29/16 0600  aztreonam (AZACTAM) 2 g in dextrose 5 % 50 mL IVPB  Status:  Discontinued     2 g 100 mL/hr over 30 Minutes Intravenous Every 8 hours 08/29/16 0402 08/29/16 1558   08/29/16 0415  levofloxacin (LEVAQUIN) IVPB 750 mg    Comments:  sepsis   750 mg 100 mL/hr over 90 Minutes Intravenous  Once 08/29/16 0402 08/29/16 0826   08/29/16 0400  aztreonam (AZACTAM) injection 1 g  Status:  Discontinued     1 g Intramuscular Every 8 hours 08/29/16 0312 08/29/16 0539   08/29/16 0330  vancomycin (VANCOCIN) 1,500 mg in sodium chloride 0.9 % 500 mL IVPB     1,500 mg 250 mL/hr over 120 Minutes  Intravenous  Once 08/29/16 0304 08/29/16 1019   08/29/16 0315  levofloxacin (LEVAQUIN) tablet 750 mg  Status:  Discontinued     750 mg Oral  Once 08/29/16 0304 08/29/16 0402   08/29/16 0130  vancomycin (VANCOCIN) 1,500 mg in sodium chloride 0.9 % 500 mL IVPB  Status:  Discontinued     1,500 mg 250 mL/hr over 120 Minutes Intravenous  Once 08/29/16 0116 08/29/16 0304   08/29/16 0115  levofloxacin (LEVAQUIN) IVPB 750 mg  Status:  Discontinued     750 mg 100 mL/hr over 90 Minutes Intravenous  Once 08/29/16 0108 08/29/16 0304   08/29/16 0115  aztreonam (AZACTAM) 2 g in dextrose 5 % 50 mL IVPB  Status:  Discontinued     2 g 100 mL/hr over 30 Minutes Intravenous  Once 08/29/16 0108 08/29/16 0312   08/29/16 0115  vancomycin (VANCOCIN) IVPB 1000 mg/200 mL premix  Status:  Discontinued     1,000 mg 200 mL/hr over 60 Minutes Intravenous  Once 08/29/16 0108 08/29/16 0116      ROS: As noted in the HPI otherwise remainder of 12 point Review of Systems is negative.  Blood pressure (!) 153/48, pulse 70, temperature 98 F (36.7 C), temperature source Oral, resp. rate 11, height '5\' 4"'  (1.626 m), weight 210 lb 8 oz (95.5 kg), SpO2 92 %. General: elderly appearing African American female, resting in bed HEENT: PERRL, EOMI, no scleral icterus, oropharynx clear Cardiac: RRR, systolic ejection murmur best appreciated in the right upper sternal border Pulm: clear to auscultation bilaterally, no wheezes, rales, or rhonchi Abd: soft, nontender, nondistended, BS present Ext: warm and well perfused, right lower extremity wounds as noted below along with small area of discoloration noted behind the right second toe and on the palmar aspect of the left fifth finger            Neuro: responds to questions appropriately; alert and oriented to name, place, year; unable to move right lower extremity and restricted range of motion of the right upper extremity   Results for orders placed or performed during  the hospital encounter of 08/28/16 (from the past 48 hour(s))  Glucose, capillary     Status: Abnormal   Collection Time: 09/09/16  4:59 PM  Result Value Ref Range   Glucose-Capillary 245 (H) 65 - 99 mg/dL  Glucose, capillary     Status: Abnormal   Collection Time: 09/09/16  7:51 PM  Result Value Ref Range   Glucose-Capillary 230 (H) 65 - 99 mg/dL  Glucose, capillary     Status: Abnormal   Collection Time: 09/10/16 12:17 AM  Result Value Ref Range   Glucose-Capillary 195 (H) 65 - 99 mg/dL  Glucose, capillary     Status: Abnormal   Collection Time: 09/10/16  5:06 AM  Result Value Ref Range   Glucose-Capillary 153 (H) 65 - 99 mg/dL  Glucose, capillary     Status: Abnormal   Collection Time: 09/10/16  7:47 AM  Result Value Ref Range   Glucose-Capillary 161 (H) 65 - 99 mg/dL  Basic metabolic panel     Status: Abnormal   Collection Time: 09/10/16  8:45 AM  Result Value Ref Range   Sodium 134 (L) 135 - 145 mmol/L   Potassium 4.2 3.5 - 5.1 mmol/L    Comment: HEMOLYSIS AT THIS LEVEL MAY AFFECT RESULT   Chloride 91 (L) 101 - 111 mmol/L   CO2 29 22 - 32 mmol/L   Glucose, Bld 155 (H) 65 - 99 mg/dL   BUN 10 6 - 20 mg/dL   Creatinine, Ser 0.88 0.44 - 1.00 mg/dL   Calcium 9.3 8.9 - 10.3 mg/dL   GFR calc non Af Amer >60 >60 mL/min   GFR calc Af Amer >60 >60 mL/min    Comment: (NOTE) The eGFR has been calculated using the CKD EPI equation. This calculation has not been validated in all clinical situations. eGFR's persistently <60 mL/min signify possible Chronic Kidney Disease.    Anion gap 14 5 - 15  Glucose, capillary     Status: Abnormal   Collection Time: 09/10/16 11:39 AM  Result Value Ref Range   Glucose-Capillary 178 (H) 65 - 99 mg/dL  Glucose, capillary     Status: Abnormal   Collection Time: 09/10/16  4:25 PM  Result Value Ref Range   Glucose-Capillary 187 (H) 65 - 99 mg/dL  Glucose, capillary     Status:  Abnormal   Collection Time: 09/10/16  9:19 PM  Result Value Ref  Range   Glucose-Capillary 200 (H) 65 - 99 mg/dL  Glucose, capillary     Status: Abnormal   Collection Time: 09/11/16 12:10 AM  Result Value Ref Range   Glucose-Capillary 181 (H) 65 - 99 mg/dL  Glucose, capillary     Status: Abnormal   Collection Time: 09/11/16  4:37 AM  Result Value Ref Range   Glucose-Capillary 129 (H) 65 - 99 mg/dL  Basic metabolic panel     Status: Abnormal   Collection Time: 09/11/16  5:38 AM  Result Value Ref Range   Sodium 132 (L) 135 - 145 mmol/L   Potassium 3.0 (L) 3.5 - 5.1 mmol/L    Comment: DELTA CHECK NOTED   Chloride 87 (L) 101 - 111 mmol/L   CO2 34 (H) 22 - 32 mmol/L   Glucose, Bld 158 (H) 65 - 99 mg/dL   BUN 9 6 - 20 mg/dL   Creatinine, Ser 0.85 0.44 - 1.00 mg/dL   Calcium 9.0 8.9 - 10.3 mg/dL   GFR calc non Af Amer >60 >60 mL/min   GFR calc Af Amer >60 >60 mL/min    Comment: (NOTE) The eGFR has been calculated using the CKD EPI equation. This calculation has not been validated in all clinical situations. eGFR's persistently <60 mL/min signify possible Chronic Kidney Disease.    Anion gap 11 5 - 15  Magnesium     Status: None   Collection Time: 09/11/16  5:38 AM  Result Value Ref Range   Magnesium 2.2 1.7 - 2.4 mg/dL  Glucose, capillary     Status: Abnormal   Collection Time: 09/11/16  8:19 AM  Result Value Ref Range   Glucose-Capillary 176 (H) 65 - 99 mg/dL  Glucose, capillary     Status: Abnormal   Collection Time: 09/11/16 11:12 AM  Result Value Ref Range   Glucose-Capillary 250 (H) 65 - 99 mg/dL   '@BRIEFLABTABLE' (sdes,specrequest,cult,reptstatus)   ) Recent Results (from the past 720 hour(s))  Blood Culture (routine x 2)     Status: Abnormal   Collection Time: 08/29/16  1:27 AM  Result Value Ref Range Status   Specimen Description BLOOD LEFT ARM  Final   Special Requests BLOOD 5CC  Final   Culture  Setup Time   Final    GRAM POSITIVE COCCI IN CHAINS IN BOTH AEROBIC AND ANAEROBIC BOTTLES CRITICAL RESULT CALLED TO, READ BACK BY  AND VERIFIED WITH: J. Arminger Pharm.D. 15:40 08/29/16  ([email or web address])    Culture GROUP B STREP(S.AGALACTIAE)ISOLATED (A)  Final   Report Status 08/31/2016 FINAL  Final   Organism ID, Bacteria GROUP B STREP(S.AGALACTIAE)ISOLATED  Final      Susceptibility   Group b strep(s.agalactiae)isolated - MIC*    CLINDAMYCIN >=1 RESISTANT Resistant     AMPICILLIN <=0.25 SENSITIVE Sensitive     ERYTHROMYCIN >=8 RESISTANT Resistant     VANCOMYCIN 0.5 SENSITIVE Sensitive     CEFTRIAXONE <=0.12 SENSITIVE Sensitive     LEVOFLOXACIN 1 SENSITIVE Sensitive     * GROUP B STREP(S.AGALACTIAE)ISOLATED  Blood Culture ID Panel (Reflexed)     Status: Abnormal   Collection Time: 08/29/16  1:27 AM  Result Value Ref Range Status   Enterococcus species NOT DETECTED NOT DETECTED Final   Listeria monocytogenes NOT DETECTED NOT DETECTED Final   Staphylococcus species NOT DETECTED NOT DETECTED Final   Staphylococcus aureus NOT DETECTED NOT DETECTED Final   Streptococcus species DETECTED (A) NOT  DETECTED Final    Comment: CRITICAL RESULT CALLED TO, READ BACK BY AND VERIFIED WITH: J. Arminger Pharm.D. 15:40 08/29/16 ([email or web address])    Streptococcus agalactiae DETECTED (A) NOT DETECTED Final    Comment: CRITICAL RESULT CALLED TO, READ BACK BY AND VERIFIED WITH: J. Arminger Pharm.D. 15:40 08/29/16 ([email or web address])    Streptococcus pneumoniae NOT DETECTED NOT DETECTED Final   Streptococcus pyogenes NOT DETECTED NOT DETECTED Final   Acinetobacter baumannii NOT DETECTED NOT DETECTED Final   Enterobacteriaceae species NOT DETECTED NOT DETECTED Final   Enterobacter cloacae complex NOT DETECTED NOT DETECTED Final   Escherichia coli NOT DETECTED NOT DETECTED Final   Klebsiella oxytoca NOT DETECTED NOT DETECTED Final   Klebsiella pneumoniae NOT DETECTED NOT DETECTED Final   Proteus species NOT DETECTED NOT DETECTED Final   Serratia marcescens NOT DETECTED NOT DETECTED Final   Haemophilus influenzae NOT DETECTED NOT DETECTED Final   Neisseria  meningitidis NOT DETECTED NOT DETECTED Final   Pseudomonas aeruginosa NOT DETECTED NOT DETECTED Final   Candida albicans NOT DETECTED NOT DETECTED Final   Candida glabrata NOT DETECTED NOT DETECTED Final   Candida krusei NOT DETECTED NOT DETECTED Final   Candida parapsilosis NOT DETECTED NOT DETECTED Final   Candida tropicalis NOT DETECTED NOT DETECTED Final  Blood Culture (routine x 2)     Status: Abnormal   Collection Time: 08/29/16  3:18 AM  Result Value Ref Range Status   Specimen Description BLOOD LEFT ARM  Final   Special Requests IN PEDIATRIC BOTTLE 2ML  Final   Culture  Setup Time   Final    GRAM POSITIVE COCCI IN CHAINS IN PEDIATRIC BOTTLE CRITICAL VALUE NOTED.  VALUE IS CONSISTENT WITH PREVIOUSLY REPORTED AND CALLED VALUE.    Culture (A)  Final    GROUP B STREP(S.AGALACTIAE)ISOLATED SUSCEPTIBILITIES PERFORMED ON PREVIOUS CULTURE WITHIN THE LAST 5 DAYS.    Report Status 08/31/2016 FINAL  Final  Urine culture     Status: None   Collection Time: 08/29/16  6:15 PM  Result Value Ref Range Status   Specimen Description URINE, CATHETERIZED  Final   Special Requests NONE  Final   Culture NO GROWTH  Final   Report Status 08/30/2016 FINAL  Final  MRSA PCR Screening     Status: None   Collection Time: 08/29/16  8:06 PM  Result Value Ref Range Status   MRSA by PCR NEGATIVE NEGATIVE Final    Comment:        The GeneXpert MRSA Assay (FDA approved for NASAL specimens only), is one component of a comprehensive MRSA colonization surveillance program. It is not intended to diagnose MRSA infection nor to guide or monitor treatment for MRSA infections.   Culture, blood (Routine X 2) w Reflex to ID Panel     Status: None   Collection Time: 08/30/16  1:48 PM  Result Value Ref Range Status   Specimen Description BLOOD LEFT ARM  Final   Special Requests IN PEDIATRIC BOTTLE .5CC  Final   Culture NO GROWTH 5 DAYS  Final   Report Status 09/04/2016 FINAL  Final  Culture, blood  (Routine X 2) w Reflex to ID Panel     Status: None   Collection Time: 08/30/16  1:48 PM  Result Value Ref Range Status   Specimen Description BLOOD LEFT ARM  Final   Special Requests IN PEDIATRIC BOTTLE [place]  Final   Culture NO GROWTH 5 DAYS  Final   Report Status 09/04/2016 FINAL  Final  Impression/Recommendation  Principal Problem:   Acute encephalopathy Active Problems:   DM2 (diabetes mellitus, type 2) (HCC)   Atherosclerosis of native arteries of the extremities with ulceration (HCC)   Essential hypertension   Ulcer of right lower extremity (HCC)   Sepsis ([place])   Bilateral pneumonia   Bacteremia due to group B Streptococcus  [name] is a 76 y.o. 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  Group B Strep bacteremia: Likely from her chronic skin wounds. Chest imaging did not seem consistent with a pneumonia, and acute encephalopathy was likely from the underlying bacteremia. No fevers since admission is reassuring though she has skin changes concerning for endocarditis as noted on her toe and finger above. Risk factors include poorly controlled diabetes and peripheral vascular disease, both of which delay wound healing. HIV was negative on admission. -Continue levofloxacin given allergy [Abx Day 12]. Cefazolin was ordered in July 2015 prior to ORIF but not administered for unclear reasons, and given her report of anaphylaxis, we will continue with her current regimen until we have definitive evidence of endocarditis. -Order MRI of R tib/fib and foot to r/o bony involvement -Order TEE to r/o vegetation given her murmur and degree of calcification on her valve as well as the skin findings  09/11/2016, 3:39 PM   Thank you so much for this interesting consult  [place] [facility] [place] [phone] (pager) [phone] (office) 09/11/2016, 3:39 PM  Charlott Rakes 09/11/2016, 3:39 PM

## 2016-09-11 NOTE — Progress Notes (Signed)
PROGRESS NOTE    [name]  [id number] DOB: Sep 06, 1940 DOA: 08/28/2016 PCP: [name], MD    Brief Narrative:  Briefly, complex vascular patient with history of PAD, DM, HTN, prior CVA with residual right sided weakness admitted with sepsis due to pneumonia and bacteremia. She was fluid resuscitated on admission due to sepsis, now requiring IV diuresis due to Pulmonary edema and acute on chronic dCHF  Assessment & Plan:   Principal Problem:   Acute encephalopathy Active Problems:   DM2 (diabetes mellitus, type 2) (HCC)   Atherosclerosis of native arteries of the extremities with ulceration (HCC)   Essential hypertension   Ulcer of right lower extremity (HCC)   Sepsis ([place])   Bilateral pneumonia   Bacteremia due to group B Streptococcus   Sepsis due to Group B strep bacteremia - Blood cultures were positive for group B strep, continue Levaquin alone as is sensitive based on antibiogram.  Started on levofloxacin 1/25. 2-D echo 1/29 did not show any evidence of endocarditis. ID consulted for further recommendations for antibiotic management - Surveillance cultures obtained 1/26, no growth to date - Patient remains on PO levaquin, infectious disease consult  Acute hypoxic respiratory failure due to HCAP and Pulmonary edema - Likely due to fluid resuscitation in the setting of sepsis. Chest x-ray showed slight pulmonary vascular congestion versus worsening of her pneumonia.  - Pt is continued on IV Lasix as tolerated. Clinically improving, however remains edematous - Still mildly sob and edematous - Continued on lasix at 80mg  IV bid - Wt from 2/4: 99.3kg - Wt today: 95.7kg Continue to monitor renal function which remains stable  Acute on chronic diastolic CHF - Most recent 2-D echo was done roughly 6 months ago on July 2017 and showed an ejection fraction of 55-60% as well as grade 2 diastolic dysfunction.  - Patient now fluid overload in the setting of fluid  resuscitation due to sepsis - repeat 2d echo showed normal EF, and grade 1 diastolic dysfunction - For now, will continue 80mg  IV lasix BID per above   Hypokalemia-replete and check magnesium levels  HCAP - secondary to strep pneumonia - Will continue on oral abx as per above  Acute encephalopathy  - Appears to be slowly resolving, suspect secondary to #1 and #2  - Alert, conversant, appears to interact appropriately - Stable  Hypertension  - Continued on Coreg, Norvasc - Remains stable at present  Hyperlipidemia  - Will continue statin as tolerated - Currently stable  Type 2 diabetes mellitus  - For now, will continue lantus and sliding scale - glucose remains in the mid-100's    Right lower extremity ulcer  - wound care was consulted - remains stable  Peripheral artery disease  - Patient is continued on Plavix  - Currently remains stable  Constipation - N/V with abd pain this AM - Recent Xray abd ordered and reviewed - Stool seen throughout colon per my review - Results are noted with SMOG - improved with SMOG and mg citrate - Pt continues to complain of nausea.  Abd xray reviewed. No significant stool burden noted.   DVT prophylaxis: Lovenox Code Status: Full Family Communication: Pt in room, family not at bedside Disposition Plan:  Will need SNF, anticipate discharge 2/8  Consultants:     Procedures:   2D echo Impressions: - Normal LV size with moderate LV hypertrophy. EF 60-65%. Normal RVsize and systolic function. Mild to moderate aortic stenosis(moderate visually and by calculated valve area, mild  by meangradient).  Antimicrobials: Anti-infectives    Start     Dose/Rate Route Frequency Ordered Stop   09/05/16 1000  levofloxacin (LEVAQUIN) tablet 750 mg     750 mg Oral Daily 09/04/16 1519     09/01/16 0800  vancomycin (VANCOCIN) IVPB 1000 mg/200 mL premix  Status:  Discontinued     1,000 mg 200 mL/hr over 60 Minutes Intravenous Every  12 hours 09/01/16 0731 09/02/16 1110   08/30/16 0800  levofloxacin (LEVAQUIN) IVPB 750 mg  Status:  Discontinued     750 mg 100 mL/hr over 90 Minutes Intravenous Every 24 hours 08/29/16 0545 09/04/16 1519   08/29/16 1800  vancomycin (VANCOCIN) IVPB 750 mg/150 ml premix  Status:  Discontinued     750 mg 150 mL/hr over 60 Minutes Intravenous Every 12 hours 08/29/16 0545 09/01/16 0731   08/29/16 0600  aztreonam (AZACTAM) 2 g in dextrose 5 % 50 mL IVPB  Status:  Discontinued     2 g 100 mL/hr over 30 Minutes Intravenous Every 8 hours 08/29/16 0402 08/29/16 1558   08/29/16 0415  levofloxacin (LEVAQUIN) IVPB 750 mg    Comments:  sepsis   750 mg 100 mL/hr over 90 Minutes Intravenous  Once 08/29/16 0402 08/29/16 0826   08/29/16 0400  aztreonam (AZACTAM) injection 1 g  Status:  Discontinued     1 g Intramuscular Every 8 hours 08/29/16 0312 08/29/16 0539   08/29/16 0330  vancomycin (VANCOCIN) 1,500 mg in sodium chloride 0.9 % 500 mL IVPB     1,500 mg 250 mL/hr over 120 Minutes Intravenous  Once 08/29/16 0304 08/29/16 1019   08/29/16 0315  levofloxacin (LEVAQUIN) tablet 750 mg  Status:  Discontinued     750 mg Oral  Once 08/29/16 0304 08/29/16 0402   08/29/16 0130  vancomycin (VANCOCIN) 1,500 mg in sodium chloride 0.9 % 500 mL IVPB  Status:  Discontinued     1,500 mg 250 mL/hr over 120 Minutes Intravenous  Once 08/29/16 0116 08/29/16 0304   08/29/16 0115  levofloxacin (LEVAQUIN) IVPB 750 mg  Status:  Discontinued     750 mg 100 mL/hr over 90 Minutes Intravenous  Once 08/29/16 0108 08/29/16 0304   08/29/16 0115  aztreonam (AZACTAM) 2 g in dextrose 5 % 50 mL IVPB  Status:  Discontinued     2 g 100 mL/hr over 30 Minutes Intravenous  Once 08/29/16 0108 08/29/16 0312   08/29/16 0115  vancomycin (VANCOCIN) IVPB 1000 mg/200 mL premix  Status:  Discontinued     1,000 mg 200 mL/hr over 60 Minutes Intravenous  Once 08/29/16 0108 08/29/16 0116      Subjective: Complaining of generalized  weakness,  Objective: Vitals:   09/10/16 1600 09/10/16 1700 09/10/16 2249 09/11/16 0448  BP:   (!) 153/52 (!) 166/52  Pulse: 74 79 69 68  Resp: 14 18 15 13   Temp:   98.4 F (36.9 C) 97.8 F (36.6 C)  TempSrc:   Oral Oral  SpO2: 94% 95% 99% 100%  Weight:    95.5 kg (210 lb 8 oz)  Height:        Intake/Output Summary (Last 24 hours) at 09/11/16 0820 Last data filed at 09/11/16 0600  Gross per 24 hour  Intake              587 ml  Output             2100 ml  Net            -  1513 ml   Filed Weights   09/09/16 0407 09/10/16 0615 09/11/16 0448  Weight: 99.2 kg (218 lb 9.6 oz) 95.7 kg (211 lb) 95.5 kg (210 lb 8 oz)    Examination:  General exam: in nad, awake, conversant Respiratory system: normal resp effort, no audible wheezing Cardiovascular system: regular rhythm, s1-s2 Gastrointestinal system: mildly distended, pos BS, nondistended Central nervous system: no seizures, no tremors Extremities: perfused, no clubbing Skin: no rashes//no pallor Psychiatry: affect normal// no auditory hallucinations.   Data Reviewed: I have personally reviewed following labs and imaging studies  CBC:  Recent Labs Lab 09/05/16 0430  WBC 9.5  HGB 8.7*  HCT 27.4*  MCV 81.1  PLT 123456   Basic Metabolic Panel:  Recent Labs Lab 09/07/16 0449 09/08/16 0225 09/09/16 0440 09/10/16 0845 09/11/16 0538  NA 136 136 134* 134* 132*  K 3.8 4.0 3.0* 4.2 3.0*  CL 94* 90* 91* 91* 87*  CO2 35* 35* 36* 29 34*  GLUCOSE 210* 248* 187* 155* 158*  BUN 12 12 9 10 9   CREATININE 0.79 0.85 0.79 0.88 0.85  CALCIUM 9.4 9.2 9.0 9.3 9.0   GFR: Estimated Creatinine Clearance: 66.1 mL/min (by C-G formula based on SCr of 0.85 mg/dL). Liver Function Tests: No results for input(s): AST, ALT, ALKPHOS, BILITOT, PROT, ALBUMIN in the last 168 hours. No results for input(s): LIPASE, AMYLASE in the last 168 hours. No results for input(s): AMMONIA in the last 168 hours. Coagulation Profile: No results for  input(s): INR, PROTIME in the last 168 hours. Cardiac Enzymes: No results for input(s): CKTOTAL, CKMB, CKMBINDEX, TROPONINI in the last 168 hours. BNP (last 3 results) No results for input(s): PROBNP in the last 8760 hours. HbA1C: No results for input(s): HGBA1C in the last 72 hours. CBG:  Recent Labs Lab 09/10/16 1139 09/10/16 1625 09/10/16 2119 09/11/16 0010 09/11/16 0437  GLUCAP 178* 187* 200* 181* 129*   Lipid Profile: No results for input(s): CHOL, HDL, LDLCALC, TRIG, CHOLHDL, LDLDIRECT in the last 72 hours. Thyroid Function Tests: No results for input(s): TSH, T4TOTAL, FREET4, T3FREE, THYROIDAB in the last 72 hours. Anemia Panel: No results for input(s): VITAMINB12, FOLATE, FERRITIN, TIBC, IRON, RETICCTPCT in the last 72 hours. Sepsis Labs: No results for input(s): PROCALCITON, LATICACIDVEN in the last 168 hours.  No results found for this or any previous visit (from the past 240 hour(s)).   Radiology Studies: Dg Abd Portable 1v  Result Date: 09/10/2016 CLINICAL DATA:  Constipated and vomiting. EXAM: PORTABLE ABDOMEN - 1 VIEW COMPARISON:  None. FINDINGS: The bowel gas pattern is normal. No radio-opaque calculi or other significant radiographic abnormality are seen. IMPRESSION: Negative one view abdomen. Electronically Signed   By: San Morelle M.D.   On: 09/10/2016 10:09    Scheduled Meds: . amLODipine  10 mg Oral Daily  . atorvastatin  20 mg Oral Daily  . carbamide peroxide  5 drop Both Ears BID  . carvedilol  12.5 mg Oral BID WC  . clopidogrel  75 mg Oral Q breakfast  . enoxaparin (LOVENOX) injection  40 mg Subcutaneous Daily  . feeding supplement (GLUCERNA SHAKE)  237 mL Oral TID BM  . furosemide  80 mg Intravenous Q12H  . insulin aspart  0-9 Units Subcutaneous Q4H  . insulin aspart  3 Units Subcutaneous TID WC  . insulin glargine  27 Units Subcutaneous QHS  . isosorbide mononitrate  60 mg Oral Daily  . levofloxacin  750 mg Oral Daily  . multivitamin  with minerals  1 tablet Oral Daily  . mupirocin cream   Topical Daily  . ranolazine  500 mg Oral BID  . sodium chloride flush  10-20 mL Intravenous Q12H   Continuous Infusions:   LOS: 13 days   Reyne Dumas, MD [facility] Pager [phone]  If 7PM-7AM, please contact night-coverage [email or web address] Password [email or web address] 09/11/2016, 8:20 AM

## 2016-09-11 NOTE — Progress Notes (Signed)
Offered Pt a bath, Pt stated she did not want to do a bath at this time. Tech will try again later.

## 2016-09-12 ENCOUNTER — Inpatient Hospital Stay (HOSPITAL_COMMUNITY): Payer: Medicare HMO

## 2016-09-12 DIAGNOSIS — I739 Peripheral vascular disease, unspecified: Secondary | ICD-10-CM

## 2016-09-12 DIAGNOSIS — A401 Sepsis due to streptococcus, group B: Secondary | ICD-10-CM

## 2016-09-12 DIAGNOSIS — S81809A Unspecified open wound, unspecified lower leg, initial encounter: Secondary | ICD-10-CM

## 2016-09-12 DIAGNOSIS — B351 Tinea unguium: Secondary | ICD-10-CM

## 2016-09-12 LAB — COMPREHENSIVE METABOLIC PANEL
ALBUMIN: 2.7 g/dL — AB (ref 3.5–5.0)
ALT: 13 U/L — ABNORMAL LOW (ref 14–54)
ANION GAP: 9 (ref 5–15)
AST: 18 U/L (ref 15–41)
Alkaline Phosphatase: 56 U/L (ref 38–126)
BILIRUBIN TOTAL: 0.9 mg/dL (ref 0.3–1.2)
BUN: 7 mg/dL (ref 6–20)
CHLORIDE: 92 mmol/L — AB (ref 101–111)
CO2: 32 mmol/L (ref 22–32)
Calcium: 9.2 mg/dL (ref 8.9–10.3)
Creatinine, Ser: 0.79 mg/dL (ref 0.44–1.00)
GFR calc Af Amer: >60 mL/min (ref >60)
Glucose, Bld: 250 mg/dL — ABNORMAL HIGH (ref 65–99)
POTASSIUM: 3.8 mmol/L (ref 3.5–5.1)
Sodium: 133 mmol/L — ABNORMAL LOW (ref 135–145)
TOTAL PROTEIN: 5.7 g/dL — AB (ref 6.5–8.1)

## 2016-09-12 LAB — GLUCOSE, CAPILLARY
GLUCOSE-CAPILLARY: 198 mg/dL — AB (ref 65–99)
GLUCOSE-CAPILLARY: 240 mg/dL — AB (ref 65–99)
GLUCOSE-CAPILLARY: 278 mg/dL — AB (ref 65–99)
Glucose-Capillary: 210 mg/dL — ABNORMAL HIGH (ref 65–99)
Glucose-Capillary: 190 mg/dL — ABNORMAL HIGH (ref 65–99)
Glucose-Capillary: 218 mg/dL — ABNORMAL HIGH (ref 65–99)

## 2016-09-12 LAB — CBC
HEMATOCRIT: 32.5 % — AB (ref 36.0–46.0)
Hemoglobin: 10.2 g/dL — ABNORMAL LOW (ref 12.0–15.0)
MCH: 24.8 pg — ABNORMAL LOW (ref 26.0–34.0)
MCHC: 31.4 g/dL (ref 30.0–36.0)
MCV: 79.1 fL (ref 78.0–100.0)
PLATELETS: 412 10*3/uL — AB (ref 150–400)
RBC: 4.11 MIL/uL (ref 3.87–5.11)
RDW: 15.6 % — AB (ref 11.5–15.5)
WBC: 9.2 10*3/uL (ref 4.0–10.5)

## 2016-09-12 MED ORDER — GADOBENATE DIMEGLUMINE 529 MG/ML IV SOLN
20.0000 mL | Freq: Once | INTRAVENOUS | Status: AC
  Administered 2016-09-12: 20 mL via INTRAVENOUS

## 2016-09-12 NOTE — Progress Notes (Addendum)
PROGRESS NOTE    [name]  [id number] DOB: [birth year] DOA: 08/28/2016 PCP: [name], MD    Brief Narrative:  Briefly, complex vascular patient with history of PAD, DM, HTN, prior CVA with residual right sided weakness admitted with sepsis due to pneumonia and bacteremia. She was fluid resuscitated on admission due to sepsis, now requiring IV diuresis due to Pulmonary edema and acute on chronic dCHF  Assessment & Plan:   Principal Problem:   Acute encephalopathy Active Problems:   DM2 (diabetes mellitus, type 2) (HCC)   Atherosclerosis of native arteries of the extremities with ulceration (HCC)   Essential hypertension   Ulcer of right lower extremity (HCC)   Sepsis ([place])   Bilateral pneumonia   Bacteremia   Sepsis due to Group B strep bacteremia - Blood cultures were positive for group B strep, continue Levaquin alone as is sensitive based on antibiogram.  Started on levofloxacin 1/25. 2-D echo 1/29 did not show any evidence of endocarditis. ID consulted for further recommendations for antibiotic management - Surveillance cultures obtained 1/26, no growth to date - Patient remains on PO levaquin, as per infectious disease group B streptococcal bacteremia originated from one of her open wounds. ID concerned about deep infection of the bones in the right ankle and foot and we will obtain an MRI to look for this , MRI done results pending Patient may need TEE-requested cardiology to schedule  Acute hypoxic respiratory failure due to HCAP and Pulmonary edema - Likely due to fluid resuscitation in the setting of sepsis. Chest x-ray showed slight pulmonary vascular congestion versus worsening of her pneumonia.  - Still mildly sob but edema improving Changed Lasix to by mouth 2/7 Documented bodyweight likely inaccurate as the patient has not lost any weight since 1/25 but the swelling has definitely improved Continue to monitor renal function which remains stable  Acute  on chronic diastolic CHF - Most recent 2-D echo was done roughly 6 months ago on July 2017 and showed an ejection fraction of 55-60% as well as grade 2 diastolic dysfunction.  - Patient now fluid overload in the setting of fluid resuscitation due to sepsis - repeat 2d echo showed normal EF, and grade 1 diastolic dysfunction - For now, will continue by mouth Lasix   Hypokalemia-replete and check magnesium levels  HCAP - secondary to strep pneumonia - Will continue on oral abx as per above  Acute encephalopathy  - Appears to be slowly resolving, suspect secondary to #1 and #2  - Alert, conversant, appears to interact appropriately - Stable  Hypertension  - Continued on Coreg, Norvasc - Remains stable at present  Hyperlipidemia  - Will continue statin as tolerated - Currently stable  Type 2 diabetes mellitus  - For now, will continue lantus and sliding scale - glucose remains in the mid-100's    Right lower extremity ulcer  - wound care was consulted, ID consulted - remains stable there is no evidence of deep infection such as an abscess or septic joint or osteomyelitis  Peripheral artery disease  - Patient is continued on Plavix  - Currently remains stable  Constipation - N/V with abd pain this AM KUB negative - Stool seen throughout colon   - Results are noted with SMOG - improved with SMOG and mg citrate     DVT prophylaxis: Lovenox Code Status: Full Family Communication: Pt in room, family not at bedside Disposition Plan:  Will need SNF, anticipate discharge  pending ID evaluation completion  Consultants:   ID  Procedures:   2D echo Impressions: - Normal LV size with moderate LV hypertrophy. EF 60-65%. Normal RVsize and systolic function. Mild to moderate aortic stenosis(moderate visually and by calculated valve area, mild by meangradient).  Antimicrobials: Anti-infectives    Start     Dose/Rate Route Frequency Ordered Stop   09/05/16 1000   levofloxacin (LEVAQUIN) tablet 750 mg     750 mg Oral Daily 09/04/16 1519     09/01/16 0800  vancomycin (VANCOCIN) IVPB 1000 mg/200 mL premix  Status:  Discontinued     1,000 mg 200 mL/hr over 60 Minutes Intravenous Every 12 hours 09/01/16 0731 09/02/16 1110   08/30/16 0800  levofloxacin (LEVAQUIN) IVPB 750 mg  Status:  Discontinued     750 mg 100 mL/hr over 90 Minutes Intravenous Every 24 hours 08/29/16 0545 09/04/16 1519   08/29/16 1800  vancomycin (VANCOCIN) IVPB 750 mg/150 ml premix  Status:  Discontinued     750 mg 150 mL/hr over 60 Minutes Intravenous Every 12 hours 08/29/16 0545 09/01/16 0731   08/29/16 0600  aztreonam (AZACTAM) 2 g in dextrose 5 % 50 mL IVPB  Status:  Discontinued     2 g 100 mL/hr over 30 Minutes Intravenous Every 8 hours 08/29/16 0402 08/29/16 1558   08/29/16 0415  levofloxacin (LEVAQUIN) IVPB 750 mg    Comments:  sepsis   750 mg 100 mL/hr over 90 Minutes Intravenous  Once 08/29/16 0402 08/29/16 0826   08/29/16 0400  aztreonam (AZACTAM) injection 1 g  Status:  Discontinued     1 g Intramuscular Every 8 hours 08/29/16 0312 08/29/16 0539   08/29/16 0330  vancomycin (VANCOCIN) 1,500 mg in sodium chloride 0.9 % 500 mL IVPB     1,500 mg 250 mL/hr over 120 Minutes Intravenous  Once 08/29/16 0304 08/29/16 1019   08/29/16 0315  levofloxacin (LEVAQUIN) tablet 750 mg  Status:  Discontinued     750 mg Oral  Once 08/29/16 0304 08/29/16 0402   08/29/16 0130  vancomycin (VANCOCIN) 1,500 mg in sodium chloride 0.9 % 500 mL IVPB  Status:  Discontinued     1,500 mg 250 mL/hr over 120 Minutes Intravenous  Once 08/29/16 0116 08/29/16 0304   08/29/16 0115  levofloxacin (LEVAQUIN) IVPB 750 mg  Status:  Discontinued     750 mg 100 mL/hr over 90 Minutes Intravenous  Once 08/29/16 0108 08/29/16 0304   08/29/16 0115  aztreonam (AZACTAM) 2 g in dextrose 5 % 50 mL IVPB  Status:  Discontinued     2 g 100 mL/hr over 30 Minutes Intravenous  Once 08/29/16 0108 08/29/16 0312   08/29/16  0115  vancomycin (VANCOCIN) IVPB 1000 mg/200 mL premix  Status:  Discontinued     1,000 mg 200 mL/hr over 60 Minutes Intravenous  Once 08/29/16 0108 08/29/16 0116      Subjective: Complaining of generalized weakness,  Objective: Vitals:   09/11/16 2114 09/12/16 0001 09/12/16 0439 09/12/16 1045  BP: (!) 146/46 (!) 104/54 (!) 151/59 (!) 165/54  Pulse: 73 73 75 85  Resp: (!) 22 13 14 17   Temp: 97.9 F (36.6 C) 97.9 F (36.6 C) 98.3 F (36.8 C)   TempSrc: Oral Oral Oral   SpO2: 91% 94% 95% 98%  Weight:   97 kg (213 lb 14.4 oz)   Height:        Intake/Output Summary (Last 24 hours) at 09/12/16 1257 Last data filed at 09/11/16 1805  Gross per 24 hour  Intake              307 ml  Output              425 ml  Net             -118 ml   Filed Weights   09/10/16 0615 09/11/16 0448 09/12/16 0439  Weight: 95.7 kg (211 lb) 95.5 kg (210 lb 8 oz) 97 kg (213 lb 14.4 oz)    Examination:  General exam: in nad, awake, conversant Respiratory system: normal resp effort, no audible wheezing Cardiovascular system: regular rhythm, s1-s2 Gastrointestinal system: mildly distended, pos BS, nondistended Central nervous system: no seizures, no tremors Extremities: perfused, no clubbing Skin: no rashes//no pallor Psychiatry: affect normal// no auditory hallucinations.   Data Reviewed: I have personally reviewed following labs and imaging studies  CBC: No results for input(s): WBC, NEUTROABS, HGB, HCT, MCV, PLT in the last 168 hours. Basic Metabolic Panel:  Recent Labs Lab 09/07/16 0449 09/08/16 0225 09/09/16 0440 09/10/16 0845 09/11/16 0538  NA 136 136 134* 134* 132*  K 3.8 4.0 3.0* 4.2 3.0*  CL 94* 90* 91* 91* 87*  CO2 35* 35* 36* 29 34*  GLUCOSE 210* 248* 187* 155* 158*  BUN 12 12 9 10 9   CREATININE 0.79 0.85 0.79 0.88 0.85  CALCIUM 9.4 9.2 9.0 9.3 9.0  MG  --   --   --   --  2.2   GFR: Estimated Creatinine Clearance: 66.6 mL/min (by C-G formula based on SCr of 0.85  mg/dL). Liver Function Tests: No results for input(s): AST, ALT, ALKPHOS, BILITOT, PROT, ALBUMIN in the last 168 hours. No results for input(s): LIPASE, AMYLASE in the last 168 hours. No results for input(s): AMMONIA in the last 168 hours. Coagulation Profile: No results for input(s): INR, PROTIME in the last 168 hours. Cardiac Enzymes: No results for input(s): CKTOTAL, CKMB, CKMBINDEX, TROPONINI in the last 168 hours. BNP (last 3 results) No results for input(s): PROBNP in the last 8760 hours. HbA1C: No results for input(s): HGBA1C in the last 72 hours. CBG:  Recent Labs Lab 09/11/16 2115 09/12/16 0000 09/12/16 0438 09/12/16 0733 09/12/16 1141  GLUCAP 163* 198* 210* 190* 240*   Lipid Profile: No results for input(s): CHOL, HDL, LDLCALC, TRIG, CHOLHDL, LDLDIRECT in the last 72 hours. Thyroid Function Tests: No results for input(s): TSH, T4TOTAL, FREET4, T3FREE, THYROIDAB in the last 72 hours. Anemia Panel: No results for input(s): VITAMINB12, FOLATE, FERRITIN, TIBC, IRON, RETICCTPCT in the last 72 hours. Sepsis Labs: No results for input(s): PROCALCITON, LATICACIDVEN in the last 168 hours.  No results found for this or any previous visit (from the past 240 hour(s)).   Radiology Studies: No results found.  Scheduled Meds: . amLODipine  10 mg Oral Daily  . atorvastatin  20 mg Oral Daily  . carbamide peroxide  5 drop Both Ears BID  . carvedilol  12.5 mg Oral BID WC  . clopidogrel  75 mg Oral Q breakfast  . enoxaparin (LOVENOX) injection  40 mg Subcutaneous Daily  . feeding supplement (GLUCERNA SHAKE)  237 mL Oral TID BM  . furosemide  40 mg Oral BID  . insulin aspart  0-9 Units Subcutaneous Q4H  . insulin aspart  3 Units Subcutaneous TID WC  . insulin glargine  27 Units Subcutaneous QHS  . isosorbide mononitrate  60 mg Oral Daily  . levofloxacin  750 mg Oral Daily  . multivitamin with minerals  1 tablet Oral Daily  .  mupirocin cream   Topical Daily  . ranolazine   500 mg Oral BID  . sodium chloride flush  10-20 mL Intravenous Q12H   Continuous Infusions:   LOS: 14 days   Reyne Dumas, MD [facility] Pager [phone]  If 7PM-7AM, please contact night-coverage [email or web address] Password [facility] 09/12/2016, 12:57 PM

## 2016-09-12 NOTE — Care Management Important Message (Signed)
Important Message  Patient Details  Name: [name] MRN: [id number] Date of Birth: August 22, 1940   Medicare Important Message Given:  Yes    Nathen May 09/12/2016, 11:03 AM

## 2016-09-12 NOTE — Progress Notes (Signed)
      INFECTIOUS DISEASE ATTENDING ADDENDUM:   Date: 09/12/2016  Patient name: [name]  Medical record number: [id number]  Date of birth: 08-Feb-1941    This patient has been seen and discussed with the house staff. Please see the resident's note for complete details. I concur with their findings with the following additions/corrections:  The patient was doing relatively well today when I saw her her MRI had not yet been done now at the time this dictation her MRI has been done and there is no evidence of deep infection such as an abscess or septic joint or osteomyelitis. I still would like to get a transesophageal echocardiogram to rule out endocarditis.  In the interim fine to continue levofloxacin.  Rhina Brackett [place] 09/12/2016, 4:12 PM

## 2016-09-12 NOTE — Progress Notes (Signed)
Subjective: She was afebrile overnight. She denied any complaints this morning other than not liking to be stuck so many times for blood. Right leg and foot MRI was without findings of osteomyelitis.   Antibiotics:  Anti-infectives    Start     Dose/Rate Route Frequency Ordered Stop   09/05/16 1000  levofloxacin (LEVAQUIN) tablet 750 mg     750 mg Oral Daily 09/04/16 1519     09/01/16 0800  vancomycin (VANCOCIN) IVPB 1000 mg/200 mL premix  Status:  Discontinued     1,000 mg 200 mL/hr over 60 Minutes Intravenous Every 12 hours 09/01/16 0731 09/02/16 1110   08/30/16 0800  levofloxacin (LEVAQUIN) IVPB 750 mg  Status:  Discontinued     750 mg 100 mL/hr over 90 Minutes Intravenous Every 24 hours 08/29/16 0545 09/04/16 1519   08/29/16 1800  vancomycin (VANCOCIN) IVPB 750 mg/150 ml premix  Status:  Discontinued     750 mg 150 mL/hr over 60 Minutes Intravenous Every 12 hours 08/29/16 0545 09/01/16 0731   08/29/16 0600  aztreonam (AZACTAM) 2 g in dextrose 5 % 50 mL IVPB  Status:  Discontinued     2 g 100 mL/hr over 30 Minutes Intravenous Every 8 hours 08/29/16 0402 08/29/16 1558   08/29/16 0415  levofloxacin (LEVAQUIN) IVPB 750 mg    Comments:  sepsis   750 mg 100 mL/hr over 90 Minutes Intravenous  Once 08/29/16 0402 08/29/16 0826   08/29/16 0400  aztreonam (AZACTAM) injection 1 g  Status:  Discontinued     1 g Intramuscular Every 8 hours 08/29/16 0312 08/29/16 0539   08/29/16 0330  vancomycin (VANCOCIN) 1,500 mg in sodium chloride 0.9 % 500 mL IVPB     1,500 mg 250 mL/hr over 120 Minutes Intravenous  Once 08/29/16 0304 08/29/16 1019   08/29/16 0315  levofloxacin (LEVAQUIN) tablet 750 mg  Status:  Discontinued     750 mg Oral  Once 08/29/16 0304 08/29/16 0402   08/29/16 0130  vancomycin (VANCOCIN) 1,500 mg in sodium chloride 0.9 % 500 mL IVPB  Status:  Discontinued     1,500 mg 250 mL/hr over 120 Minutes Intravenous  Once 08/29/16 0116 08/29/16 0304   08/29/16 0115   levofloxacin (LEVAQUIN) IVPB 750 mg  Status:  Discontinued     750 mg 100 mL/hr over 90 Minutes Intravenous  Once 08/29/16 0108 08/29/16 0304   08/29/16 0115  aztreonam (AZACTAM) 2 g in dextrose 5 % 50 mL IVPB  Status:  Discontinued     2 g 100 mL/hr over 30 Minutes Intravenous  Once 08/29/16 0108 08/29/16 0312   08/29/16 0115  vancomycin (VANCOCIN) IVPB 1000 mg/200 mL premix  Status:  Discontinued     1,000 mg 200 mL/hr over 60 Minutes Intravenous  Once 08/29/16 0108 08/29/16 0116      Medications: Scheduled Meds: . amLODipine  10 mg Oral Daily  . atorvastatin  20 mg Oral Daily  . carbamide peroxide  5 drop Both Ears BID  . carvedilol  12.5 mg Oral BID WC  . clopidogrel  75 mg Oral Q breakfast  . enoxaparin (LOVENOX) injection  40 mg Subcutaneous Daily  . feeding supplement (GLUCERNA SHAKE)  237 mL Oral TID BM  . furosemide  40 mg Oral BID  . insulin aspart  0-9 Units Subcutaneous Q4H  . insulin aspart  3 Units Subcutaneous TID WC  . insulin glargine  27 Units Subcutaneous QHS  . isosorbide mononitrate  60 mg Oral Daily  . levofloxacin  750 mg Oral Daily  . multivitamin with minerals  1 tablet Oral Daily  . mupirocin cream   Topical Daily  . ranolazine  500 mg Oral BID  . sodium chloride flush  10-20 mL Intravenous Q12H   Continuous Infusions: PRN Meds:.acetaminophen, acetaminophen, albuterol, hydrALAZINE, LORazepam, ondansetron (ZOFRAN) IV, polyethylene glycol, prochlorperazine, sodium chloride, sodium chloride flush, traMADol    Objective: Weight change: 3 lb 6.4 oz (1.542 kg)  Intake/Output Summary (Last 24 hours) at 09/12/16 1728 Last data filed at 09/11/16 1805  Gross per 24 hour  Intake              247 ml  Output                0 ml  Net              247 ml   Blood pressure (!) 155/53, pulse 85, temperature 98.3 F (36.8 C), temperature source Oral, resp. rate 15, height 5\' 4"  (1.626 m), weight 213 lb 14.4 oz (97 kg), SpO2 98 %. Temp:  [97.9 F (36.6 C)-98.3  F (36.8 C)] 98.3 F (36.8 C) (02/08 0439) Pulse Rate:  [73-85] 85 (02/08 1045) Resp:  [13-22] 15 (02/08 1640) BP: (104-165)/(46-59) 155/53 (02/08 1640) SpO2:  [91 %-98 %] 98 % (02/08 1045) Weight:  [213 lb 14.4 oz (97 kg)] 213 lb 14.4 oz (97 kg) (02/08 0439)  Physical Exam: General: middle aged woman, resting in bed HEENT: PERRL, EOMI, no scleral icterus, oropharynx clear Cardiac: RRR, no rubs, murmurs or gallops Pulm: clear to auscultation bilaterally in the anterior lung fields, no wheezes, rales, or rhonchi Abd: soft, nontender, nondistended, BS present Ext: RLE with wounds covered in foam bandage and 1st and 2nd toe discolorations [see below]. Onychomycosis of multiple nails. Neuro: responds to questions appropriately; moving all extremities freely     BMET  Recent Labs  09/11/16 0538 09/12/16 1202  NA 132* 133*  K 3.0* 3.8  CL 87* 92*  CO2 34* 32  GLUCOSE 158* 250*  BUN 9 7  CREATININE 0.85 0.79  CALCIUM 9.0 9.2     Liver Panel   Recent Labs  09/12/16 1202  PROT 5.7*  ALBUMIN 2.7*  AST 18  ALT 13*  ALKPHOS 56  BILITOT 0.9       Sedimentation Rate No results for input(s): ESRSEDRATE in the last 72 hours. C-Reactive Protein No results for input(s): CRP in the last 72 hours.  Micro Results: Recent Results (from the past 720 hour(s))  Blood Culture (routine x 2)     Status: Abnormal   Collection Time: 08/29/16  1:27 AM  Result Value Ref Range Status   Specimen Description BLOOD LEFT ARM  Final   Special Requests BLOOD 5CC  Final   Culture  Setup Time   Final    GRAM POSITIVE COCCI IN CHAINS IN BOTH AEROBIC AND ANAEROBIC BOTTLES CRITICAL RESULT CALLED TO, READ BACK BY AND VERIFIED WITH: J. Arminger Pharm.D. 15:40 08/29/16  ([email or web address])    Culture GROUP B STREP(S.AGALACTIAE)ISOLATED (A)  Final   Report Status 08/31/2016 FINAL  Final   Organism ID, Bacteria GROUP B STREP(S.AGALACTIAE)ISOLATED  Final      Susceptibility   Group b  strep(s.agalactiae)isolated - MIC*    CLINDAMYCIN >=1 RESISTANT Resistant     AMPICILLIN <=0.25 SENSITIVE Sensitive     ERYTHROMYCIN >=8 RESISTANT Resistant     VANCOMYCIN 0.5 SENSITIVE Sensitive     CEFTRIAXONE <=  0.12 SENSITIVE Sensitive     LEVOFLOXACIN 1 SENSITIVE Sensitive     * GROUP B STREP(S.AGALACTIAE)ISOLATED  Blood Culture ID Panel (Reflexed)     Status: Abnormal   Collection Time: 08/29/16  1:27 AM  Result Value Ref Range Status   Enterococcus species NOT DETECTED NOT DETECTED Final   Listeria monocytogenes NOT DETECTED NOT DETECTED Final   Staphylococcus species NOT DETECTED NOT DETECTED Final   Staphylococcus aureus NOT DETECTED NOT DETECTED Final   Streptococcus species DETECTED (A) NOT DETECTED Final    Comment: CRITICAL RESULT CALLED TO, READ BACK BY AND VERIFIED WITH: J. Arminger Pharm.D. 15:40 08/29/16 ([email or web address])    Streptococcus agalactiae DETECTED (A) NOT DETECTED Final    Comment: CRITICAL RESULT CALLED TO, READ BACK BY AND VERIFIED WITH: J. Arminger Pharm.D. 15:40 08/29/16 ([email or web address])    Streptococcus pneumoniae NOT DETECTED NOT DETECTED Final   Streptococcus pyogenes NOT DETECTED NOT DETECTED Final   Acinetobacter baumannii NOT DETECTED NOT DETECTED Final   Enterobacteriaceae species NOT DETECTED NOT DETECTED Final   Enterobacter cloacae complex NOT DETECTED NOT DETECTED Final   Escherichia coli NOT DETECTED NOT DETECTED Final   Klebsiella oxytoca NOT DETECTED NOT DETECTED Final   Klebsiella pneumoniae NOT DETECTED NOT DETECTED Final   Proteus species NOT DETECTED NOT DETECTED Final   Serratia marcescens NOT DETECTED NOT DETECTED Final   Haemophilus influenzae NOT DETECTED NOT DETECTED Final   Neisseria meningitidis NOT DETECTED NOT DETECTED Final   Pseudomonas aeruginosa NOT DETECTED NOT DETECTED Final   Candida albicans NOT DETECTED NOT DETECTED Final   Candida glabrata NOT DETECTED NOT DETECTED Final   Candida krusei NOT DETECTED NOT DETECTED Final    Candida parapsilosis NOT DETECTED NOT DETECTED Final   Candida tropicalis NOT DETECTED NOT DETECTED Final  Blood Culture (routine x 2)     Status: Abnormal   Collection Time: 08/29/16  3:18 AM  Result Value Ref Range Status   Specimen Description BLOOD LEFT ARM  Final   Special Requests IN PEDIATRIC BOTTLE 2ML  Final   Culture  Setup Time   Final    GRAM POSITIVE COCCI IN CHAINS IN PEDIATRIC BOTTLE CRITICAL VALUE NOTED.  VALUE IS CONSISTENT WITH PREVIOUSLY REPORTED AND CALLED VALUE.    Culture (A)  Final    GROUP B STREP(S.AGALACTIAE)ISOLATED SUSCEPTIBILITIES PERFORMED ON PREVIOUS CULTURE WITHIN THE LAST 5 DAYS.    Report Status 08/31/2016 FINAL  Final  Urine culture     Status: None   Collection Time: 08/29/16  6:15 PM  Result Value Ref Range Status   Specimen Description URINE, CATHETERIZED  Final   Special Requests NONE  Final   Culture NO GROWTH  Final   Report Status 08/30/2016 FINAL  Final  MRSA PCR Screening     Status: None   Collection Time: 08/29/16  8:06 PM  Result Value Ref Range Status   MRSA by PCR NEGATIVE NEGATIVE Final    Comment:        The GeneXpert MRSA Assay (FDA approved for NASAL specimens only), is one component of a comprehensive MRSA colonization surveillance program. It is not intended to diagnose MRSA infection nor to guide or monitor treatment for MRSA infections.   Culture, blood (Routine X 2) w Reflex to ID Panel     Status: None   Collection Time: 08/30/16  1:48 PM  Result Value Ref Range Status   Specimen Description BLOOD LEFT ARM  Final   Special Requests IN PEDIATRIC BOTTLE .5CC  Final  Culture NO GROWTH 5 DAYS  Final   Report Status 09/04/2016 FINAL  Final  Culture, blood (Routine X 2) w Reflex to ID Panel     Status: None   Collection Time: 08/30/16  1:48 PM  Result Value Ref Range Status   Specimen Description BLOOD LEFT ARM  Final   Special Requests IN PEDIATRIC BOTTLE [place]  Final   Culture NO GROWTH 5 DAYS  Final   Report  Status 09/04/2016 FINAL  Final    Studies/Results: Mr Foot Right W Wo Contrast  Result Date: 09/12/2016 CLINICAL DATA:  Bacteremia. Multiple ulcers of the right lower leg, ankle and foot EXAM: MRI OF THE RIGHT FOREFOOT WITHOUT AND WITH CONTRAST; MRI OF LOWER RIGHT EXTREMITY WITHOUT AND WITH CONTRAST TECHNIQUE: Multiplanar, multisequence MR imaging of the right foot was performed before and after the administration of intravenous contrast. Multiplanar, multisequence MR imaging of the right lower leg was performed before and after the administration of intravenous contrast. CONTRAST:  49mL MULTIHANCE GADOBENATE DIMEGLUMINE 529 MG/ML IV SOLN COMPARISON:  None. FINDINGS: TENDONS Peroneal: Peroneal longus tendon intact. Peroneal brevis intact. Posteromedial: Posterior tibial tendon intact. Flexor hallucis longus tendon intact. Flexor digitorum longus tendon intact. Anterior: Tibialis anterior tendon intact. Extensor hallucis longus tendon intact Extensor digitorum longus tendon intact. Achilles:  Intact. Plantar Fascia: Mild focal thickening of the medial band of the plantar fascia at the calcaneal insertion concerning for mild plantar fasciitis. LIGAMENTS Lateral: Anterior talofibular ligament intact. Calcaneofibular ligament intact. Posterior talofibular ligament intact. Anterior and posterior tibiofibular ligaments intact. Medial: Deltoid ligament intact. Spring ligament intact. Lisfranc: Intact. CARTILAGE Ankle Joint: No joint effusion. Normal ankle mortise. No chondral defect. Subtalar Joints/Sinus Tarsi: Normal subtalar joints. No subtalar joint effusion. Normal sinus tarsi. Bones: No acute fracture dislocation. Prior arthrodesis of the first MTP joint. No focal marrow signal abnormality. No periosteal reaction or bone destruction. Soft Tissue: Severe to atrophy diffusely throughout the plantar musculature. No focal fluid collection or hematoma. Generalized soft tissue edema throughout the right foot and right  lower leg with enhancement concerning for cellulitis. Mild soft tissue edema of the left lower leg. IMPRESSION: 1. Generalized soft tissue edema throughout the right foot and right lower leg with enhancement concerning for cellulitis. No drainable fluid collection to suggest an abscess. 2. No osteomyelitis of the right lower leg or right ankle and foot. 3. Mild focal thickening of the medial band of the plantar fascia at the calcaneal insertion concerning for mild plantar fasciitis. Electronically Signed   By: Kathreen Devoid   On: 09/12/2016 14:58   Mr Tibia Fibula Right W Wo Contrast  Result Date: 09/12/2016 CLINICAL DATA:  Bacteremia. Multiple ulcers of the right lower leg, ankle and foot EXAM: MRI OF THE RIGHT FOREFOOT WITHOUT AND WITH CONTRAST; MRI OF LOWER RIGHT EXTREMITY WITHOUT AND WITH CONTRAST TECHNIQUE: Multiplanar, multisequence MR imaging of the right foot was performed before and after the administration of intravenous contrast. Multiplanar, multisequence MR imaging of the right lower leg was performed before and after the administration of intravenous contrast. CONTRAST:  97mL MULTIHANCE GADOBENATE DIMEGLUMINE 529 MG/ML IV SOLN COMPARISON:  None. FINDINGS: TENDONS Peroneal: Peroneal longus tendon intact. Peroneal brevis intact. Posteromedial: Posterior tibial tendon intact. Flexor hallucis longus tendon intact. Flexor digitorum longus tendon intact. Anterior: Tibialis anterior tendon intact. Extensor hallucis longus tendon intact Extensor digitorum longus tendon intact. Achilles:  Intact. Plantar Fascia: Mild focal thickening of the medial band of the plantar fascia at the calcaneal insertion concerning for mild plantar  fasciitis. LIGAMENTS Lateral: Anterior talofibular ligament intact. Calcaneofibular ligament intact. Posterior talofibular ligament intact. Anterior and posterior tibiofibular ligaments intact. Medial: Deltoid ligament intact. Spring ligament intact. Lisfranc: Intact. CARTILAGE Ankle  Joint: No joint effusion. Normal ankle mortise. No chondral defect. Subtalar Joints/Sinus Tarsi: Normal subtalar joints. No subtalar joint effusion. Normal sinus tarsi. Bones: No acute fracture dislocation. Prior arthrodesis of the first MTP joint. No focal marrow signal abnormality. No periosteal reaction or bone destruction. Soft Tissue: Severe to atrophy diffusely throughout the plantar musculature. No focal fluid collection or hematoma. Generalized soft tissue edema throughout the right foot and right lower leg with enhancement concerning for cellulitis. Mild soft tissue edema of the left lower leg. IMPRESSION: 1. Generalized soft tissue edema throughout the right foot and right lower leg with enhancement concerning for cellulitis. No drainable fluid collection to suggest an abscess. 2. No osteomyelitis of the right lower leg or right ankle and foot. 3. Mild focal thickening of the medial band of the plantar fascia at the calcaneal insertion concerning for mild plantar fasciitis. Electronically Signed   By: Kathreen Devoid   On: 09/12/2016 14:58      Assessment/Plan:  Principal Problem:   Acute encephalopathy Active Problems:   DM2 (diabetes mellitus, type 2) ([place])   Atherosclerosis of native arteries of the extremities with ulceration (HCC)   Essential hypertension   Ulcer of right lower extremity (HCC)   Sepsis ([place])   Bilateral pneumonia   Bacteremia   Sepsis due to Streptococcus, group B ([place])   Multiple open wounds of lower leg   PVD (peripheral vascular disease) (HCC)  NOSHIN DEGRAZIA is a 76 y.o. 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  Group B Strep bacteremia: Likely from chronic skin wounds. MRI imaging today reassuring for no signs of osteomyelitis.  -Continue levofloxacin [Abx Day 13] given penicillin allergy -Await  TEE to r/o endocarditis    LOS: 14 days   Charlott Rakes 09/12/2016, 5:28 PM

## 2016-09-13 LAB — BASIC METABOLIC PANEL
Anion gap: 11 (ref 5–15)
BUN: 7 mg/dL (ref 6–20)
CO2: 29 mmol/L (ref 22–32)
Calcium: 9.4 mg/dL (ref 8.9–10.3)
Chloride: 93 mmol/L — ABNORMAL LOW (ref 101–111)
Creatinine, Ser: 0.79 mg/dL (ref 0.44–1.00)
GFR calc Af Amer: >60 mL/min (ref >60)
Glucose, Bld: 150 mg/dL — ABNORMAL HIGH (ref 65–99)
Potassium: 3.5 mmol/L (ref 3.5–5.1)
SODIUM: 133 mmol/L — AB (ref 135–145)

## 2016-09-13 LAB — GLUCOSE, CAPILLARY
GLUCOSE-CAPILLARY: 174 mg/dL — AB (ref 65–99)
GLUCOSE-CAPILLARY: 227 mg/dL — AB (ref 65–99)
GLUCOSE-CAPILLARY: 228 mg/dL — AB (ref 65–99)
Glucose-Capillary: 147 mg/dL — ABNORMAL HIGH (ref 65–99)
Glucose-Capillary: 121 mg/dL — ABNORMAL HIGH (ref 65–99)
Glucose-Capillary: 142 mg/dL — ABNORMAL HIGH (ref 65–99)
Glucose-Capillary: 192 mg/dL — ABNORMAL HIGH (ref 65–99)

## 2016-09-13 NOTE — Progress Notes (Signed)
PROGRESS NOTE    [name]  [id number] DOB: February 24, 1941 DOA: 08/28/2016 PCP: [name], MD    Brief Narrative:  Briefly, complex vascular patient with history of PAD, DM, HTN, prior CVA with residual right sided weakness admitted with sepsis due to pneumonia and bacteremia. She was fluid resuscitated on admission due to sepsis, now requiring IV diuresis due to Pulmonary edema and acute on chronic dCHF  Assessment & Plan:   Principal Problem:   Acute encephalopathy Active Problems:   DM2 (diabetes mellitus, type 2) (HCC)   Atherosclerosis of native arteries of the extremities with ulceration (HCC)   Essential hypertension   Ulcer of right lower extremity (HCC)   Sepsis ([place])   Bilateral pneumonia   Bacteremia   Sepsis due to Streptococcus, group B ([place])   Multiple open wounds of lower leg   PVD (peripheral vascular disease) ([place])   Sepsis due to Group B strep bacteremia - Blood cultures were positive for group B strep, continue Levaquin alone as is sensitive based on antibiogram.  Started on levofloxacin 1/25. 2-D echo 1/29 did not show any evidence of endocarditis. ID consulted for further recommendations for antibiotic management - Surveillance cultures obtained 1/26, no growth to date - Patient remains on PO levaquin, as per infectious disease group B streptococcal bacteremia probably originated from one of her open wounds.  ID concerned about deep infection of the bones in the right ankle and foot and  MRI no evidence of deep infection such as an abscess or septic joint or osteomyelitis Infectious disease recommends TEE to rule out endocarditis scheduled TEE for bacteremia on 09/16/16  Acute hypoxic respiratory failure due to HCAP and Pulmonary edema - Likely due to fluid resuscitation in the setting of sepsis. Chest x-ray showed slight pulmonary vascular congestion versus worsening of her pneumonia.  Breathing is improving Changed Lasix to by mouth 2/7, Documented  bodyweight likely inaccurate as the patient has not lost any weight since 1/25 but the swelling has definitely improved Continue to monitor renal function which remains stable, avoid daily blood work as patient refuses to be stuck   Acute on chronic diastolic CHF - Most recent 2-D echo was done roughly 6 months ago on July 2017 and showed an ejection fraction of 55-60% as well as grade 2 diastolic dysfunction.  - Patient now fluid overload in the setting of fluid resuscitation due to sepsis - repeat 2d echo showed normal EF, and grade 1 diastolic dysfunction - For now, will continue by mouth Lasix   Hypokalemia-replete and check magnesium levels  HCAP - secondary to strep pneumonia - Will continue on oral abx as per above  Acute encephalopathy  - Appears to be slowly resolving, suspect secondary to #1 and #2  - Alert, conversant, appears to interact appropriately - Stable  Hypertension  - Continued on Coreg, Norvasc - Remains stable at present  Hyperlipidemia  - Will continue statin as tolerated - Currently stable  Type 2 diabetes mellitus  - For now, will continue lantus and sliding scale, improving - glucose remains in the mid-100's    Right lower extremity ulcer  - wound care was consulted, ID consulted - remains stable there is no evidence of deep infection such as an abscess or septic joint or osteomyelitis  Peripheral artery disease  - Patient is continued on Plavix  - Currently remains stable  Constipation - N/V with abd pain this AM KUB negative - Stool seen throughout colon   - Results are  noted with SMOG - improved with SMOG and mg citrate     DVT prophylaxis: Lovenox Code Status: Full Family Communication: discussed with husband  Disposition Plan:  Will need SNF, anticipate discharge Monday after TEE,    Consultants:   ID  Procedures:   2D echo Impressions: - Normal LV size with moderate LV hypertrophy. EF 60-65%. Normal RVsize and  systolic function. Mild to moderate aortic stenosis(moderate visually and by calculated valve area, mild by meangradient).  Antimicrobials: Anti-infectives    Start     Dose/Rate Route Frequency Ordered Stop   09/05/16 1000  levofloxacin (LEVAQUIN) tablet 750 mg     750 mg Oral Daily 09/04/16 1519     09/01/16 0800  vancomycin (VANCOCIN) IVPB 1000 mg/200 mL premix  Status:  Discontinued     1,000 mg 200 mL/hr over 60 Minutes Intravenous Every 12 hours 09/01/16 0731 09/02/16 1110   08/30/16 0800  levofloxacin (LEVAQUIN) IVPB 750 mg  Status:  Discontinued     750 mg 100 mL/hr over 90 Minutes Intravenous Every 24 hours 08/29/16 0545 09/04/16 1519   08/29/16 1800  vancomycin (VANCOCIN) IVPB 750 mg/150 ml premix  Status:  Discontinued     750 mg 150 mL/hr over 60 Minutes Intravenous Every 12 hours 08/29/16 0545 09/01/16 0731   08/29/16 0600  aztreonam (AZACTAM) 2 g in dextrose 5 % 50 mL IVPB  Status:  Discontinued     2 g 100 mL/hr over 30 Minutes Intravenous Every 8 hours 08/29/16 0402 08/29/16 1558   08/29/16 0415  levofloxacin (LEVAQUIN) IVPB 750 mg    Comments:  sepsis   750 mg 100 mL/hr over 90 Minutes Intravenous  Once 08/29/16 0402 08/29/16 0826   08/29/16 0400  aztreonam (AZACTAM) injection 1 g  Status:  Discontinued     1 g Intramuscular Every 8 hours 08/29/16 0312 08/29/16 0539   08/29/16 0330  vancomycin (VANCOCIN) 1,500 mg in sodium chloride 0.9 % 500 mL IVPB     1,500 mg 250 mL/hr over 120 Minutes Intravenous  Once 08/29/16 0304 08/29/16 1019   08/29/16 0315  levofloxacin (LEVAQUIN) tablet 750 mg  Status:  Discontinued     750 mg Oral  Once 08/29/16 0304 08/29/16 0402   08/29/16 0130  vancomycin (VANCOCIN) 1,500 mg in sodium chloride 0.9 % 500 mL IVPB  Status:  Discontinued     1,500 mg 250 mL/hr over 120 Minutes Intravenous  Once 08/29/16 0116 08/29/16 0304   08/29/16 0115  levofloxacin (LEVAQUIN) IVPB 750 mg  Status:  Discontinued     750 mg 100 mL/hr over 90 Minutes  Intravenous  Once 08/29/16 0108 08/29/16 0304   08/29/16 0115  aztreonam (AZACTAM) 2 g in dextrose 5 % 50 mL IVPB  Status:  Discontinued     2 g 100 mL/hr over 30 Minutes Intravenous  Once 08/29/16 0108 08/29/16 0312   08/29/16 0115  vancomycin (VANCOCIN) IVPB 1000 mg/200 mL premix  Status:  Discontinued     1,000 mg 200 mL/hr over 60 Minutes Intravenous  Once 08/29/16 0108 08/29/16 0116      Subjective: She was afebrile overnight, sleepy, no nausea, cp, sob  Objective: Vitals:   09/12/16 2053 09/13/16 0421 09/13/16 0430 09/13/16 0500  BP: (!) 138/40 (!) 169/57 (!) 160/54   Pulse: 78 77    Resp: 17 16    Temp: 97.6 F (36.4 C) 98.8 F (37.1 C)    TempSrc: Oral Oral    SpO2: 94% 96%  Weight:    96.4 kg (212 lb 9.6 oz)  Height:        Intake/Output Summary (Last 24 hours) at 09/13/16 1016 Last data filed at 09/13/16 0000  Gross per 24 hour  Intake              240 ml  Output                0 ml  Net              240 ml   Filed Weights   09/11/16 0448 09/12/16 0439 09/13/16 0500  Weight: 95.5 kg (210 lb 8 oz) 97 kg (213 lb 14.4 oz) 96.4 kg (212 lb 9.6 oz)    Examination:  General exam: in nad, awake, conversant Respiratory system: normal resp effort, no audible wheezing Cardiovascular system: regular rhythm, s1-s2 Gastrointestinal system: mildly distended, pos BS, nondistended Central nervous system: no seizures, no tremors Extremities: perfused, no clubbing Skin: no rashes//no pallor Psychiatry: affect normal// no auditory hallucinations.   Data Reviewed: I have personally reviewed following labs and imaging studies  CBC:  Recent Labs Lab 09/12/16 1202  WBC 9.2  HGB 10.2*  HCT 32.5*  MCV 79.1  PLT 123456*   Basic Metabolic Panel:  Recent Labs Lab 09/09/16 0440 09/10/16 0845 09/11/16 0538 09/12/16 1202 09/13/16 0344  NA 134* 134* 132* 133* 133*  K 3.0* 4.2 3.0* 3.8 3.5  CL 91* 91* 87* 92* 93*  CO2 36* 29 34* 32 29  GLUCOSE 187* 155* 158* 250*  150*  BUN 9 10 9 7 7   CREATININE 0.79 0.88 0.85 0.79 0.79  CALCIUM 9.0 9.3 9.0 9.2 9.4  MG  --   --  2.2  --   --    GFR: Estimated Creatinine Clearance: 70.6 mL/min (by C-G formula based on SCr of 0.79 mg/dL). Liver Function Tests:  Recent Labs Lab 09/12/16 1202  AST 18  ALT 13*  ALKPHOS 56  BILITOT 0.9  PROT 5.7*  ALBUMIN 2.7*   No results for input(s): LIPASE, AMYLASE in the last 168 hours. No results for input(s): AMMONIA in the last 168 hours. Coagulation Profile: No results for input(s): INR, PROTIME in the last 168 hours. Cardiac Enzymes: No results for input(s): CKTOTAL, CKMB, CKMBINDEX, TROPONINI in the last 168 hours. BNP (last 3 results) No results for input(s): PROBNP in the last 8760 hours. HbA1C: No results for input(s): HGBA1C in the last 72 hours. CBG:  Recent Labs Lab 09/12/16 1638 09/12/16 2053 09/13/16 0107 09/13/16 0427 09/13/16 0720  GLUCAP 218* 278* 174* 147* 121*   Lipid Profile: No results for input(s): CHOL, HDL, LDLCALC, TRIG, CHOLHDL, LDLDIRECT in the last 72 hours. Thyroid Function Tests: No results for input(s): TSH, T4TOTAL, FREET4, T3FREE, THYROIDAB in the last 72 hours. Anemia Panel: No results for input(s): VITAMINB12, FOLATE, FERRITIN, TIBC, IRON, RETICCTPCT in the last 72 hours. Sepsis Labs: No results for input(s): PROCALCITON, LATICACIDVEN in the last 168 hours.  No results found for this or any previous visit (from the past 240 hour(s)).   Radiology Studies: Mr Foot Right W Wo Contrast  Result Date: 09/12/2016 CLINICAL DATA:  Bacteremia. Multiple ulcers of the right lower leg, ankle and foot EXAM: MRI OF THE RIGHT FOREFOOT WITHOUT AND WITH CONTRAST; MRI OF LOWER RIGHT EXTREMITY WITHOUT AND WITH CONTRAST TECHNIQUE: Multiplanar, multisequence MR imaging of the right foot was performed before and after the administration of intravenous contrast. Multiplanar, multisequence MR imaging of the right lower leg  was performed before  and after the administration of intravenous contrast. CONTRAST:  62mL MULTIHANCE GADOBENATE DIMEGLUMINE 529 MG/ML IV SOLN COMPARISON:  None. FINDINGS: TENDONS Peroneal: Peroneal longus tendon intact. Peroneal brevis intact. Posteromedial: Posterior tibial tendon intact. Flexor hallucis longus tendon intact. Flexor digitorum longus tendon intact. Anterior: Tibialis anterior tendon intact. Extensor hallucis longus tendon intact Extensor digitorum longus tendon intact. Achilles:  Intact. Plantar Fascia: Mild focal thickening of the medial band of the plantar fascia at the calcaneal insertion concerning for mild plantar fasciitis. LIGAMENTS Lateral: Anterior talofibular ligament intact. Calcaneofibular ligament intact. Posterior talofibular ligament intact. Anterior and posterior tibiofibular ligaments intact. Medial: Deltoid ligament intact. Spring ligament intact. Lisfranc: Intact. CARTILAGE Ankle Joint: No joint effusion. Normal ankle mortise. No chondral defect. Subtalar Joints/Sinus Tarsi: Normal subtalar joints. No subtalar joint effusion. Normal sinus tarsi. Bones: No acute fracture dislocation. Prior arthrodesis of the first MTP joint. No focal marrow signal abnormality. No periosteal reaction or bone destruction. Soft Tissue: Severe to atrophy diffusely throughout the plantar musculature. No focal fluid collection or hematoma. Generalized soft tissue edema throughout the right foot and right lower leg with enhancement concerning for cellulitis. Mild soft tissue edema of the left lower leg. IMPRESSION: 1. Generalized soft tissue edema throughout the right foot and right lower leg with enhancement concerning for cellulitis. No drainable fluid collection to suggest an abscess. 2. No osteomyelitis of the right lower leg or right ankle and foot. 3. Mild focal thickening of the medial band of the plantar fascia at the calcaneal insertion concerning for mild plantar fasciitis. Electronically Signed   By: Kathreen Devoid    On: 09/12/2016 14:58   Mr Tibia Fibula Right W Wo Contrast  Result Date: 09/12/2016 CLINICAL DATA:  Bacteremia. Multiple ulcers of the right lower leg, ankle and foot EXAM: MRI OF THE RIGHT FOREFOOT WITHOUT AND WITH CONTRAST; MRI OF LOWER RIGHT EXTREMITY WITHOUT AND WITH CONTRAST TECHNIQUE: Multiplanar, multisequence MR imaging of the right foot was performed before and after the administration of intravenous contrast. Multiplanar, multisequence MR imaging of the right lower leg was performed before and after the administration of intravenous contrast. CONTRAST:  34mL MULTIHANCE GADOBENATE DIMEGLUMINE 529 MG/ML IV SOLN COMPARISON:  None. FINDINGS: TENDONS Peroneal: Peroneal longus tendon intact. Peroneal brevis intact. Posteromedial: Posterior tibial tendon intact. Flexor hallucis longus tendon intact. Flexor digitorum longus tendon intact. Anterior: Tibialis anterior tendon intact. Extensor hallucis longus tendon intact Extensor digitorum longus tendon intact. Achilles:  Intact. Plantar Fascia: Mild focal thickening of the medial band of the plantar fascia at the calcaneal insertion concerning for mild plantar fasciitis. LIGAMENTS Lateral: Anterior talofibular ligament intact. Calcaneofibular ligament intact. Posterior talofibular ligament intact. Anterior and posterior tibiofibular ligaments intact. Medial: Deltoid ligament intact. Spring ligament intact. Lisfranc: Intact. CARTILAGE Ankle Joint: No joint effusion. Normal ankle mortise. No chondral defect. Subtalar Joints/Sinus Tarsi: Normal subtalar joints. No subtalar joint effusion. Normal sinus tarsi. Bones: No acute fracture dislocation. Prior arthrodesis of the first MTP joint. No focal marrow signal abnormality. No periosteal reaction or bone destruction. Soft Tissue: Severe to atrophy diffusely throughout the plantar musculature. No focal fluid collection or hematoma. Generalized soft tissue edema throughout the right foot and right lower leg with  enhancement concerning for cellulitis. Mild soft tissue edema of the left lower leg. IMPRESSION: 1. Generalized soft tissue edema throughout the right foot and right lower leg with enhancement concerning for cellulitis. No drainable fluid collection to suggest an abscess. 2. No osteomyelitis of the right lower leg or right  ankle and foot. 3. Mild focal thickening of the medial band of the plantar fascia at the calcaneal insertion concerning for mild plantar fasciitis. Electronically Signed   By: Kathreen Devoid   On: 09/12/2016 14:58    Scheduled Meds: . amLODipine  10 mg Oral Daily  . atorvastatin  20 mg Oral Daily  . carbamide peroxide  5 drop Both Ears BID  . carvedilol  12.5 mg Oral BID WC  . clopidogrel  75 mg Oral Q breakfast  . enoxaparin (LOVENOX) injection  40 mg Subcutaneous Daily  . feeding supplement (GLUCERNA SHAKE)  237 mL Oral TID BM  . furosemide  40 mg Oral BID  . insulin aspart  0-9 Units Subcutaneous Q4H  . insulin aspart  3 Units Subcutaneous TID WC  . insulin glargine  27 Units Subcutaneous QHS  . isosorbide mononitrate  60 mg Oral Daily  . levofloxacin  750 mg Oral Daily  . multivitamin with minerals  1 tablet Oral Daily  . mupirocin cream   Topical Daily  . ranolazine  500 mg Oral BID  . sodium chloride flush  10-20 mL Intravenous Q12H   Continuous Infusions:   LOS: 15 days   Reyne Dumas, MD [facility] Pager [phone]  If 7PM-7AM, please contact night-coverage [email or web address] Password [email or web address] 09/13/2016, 10:16 AM

## 2016-09-13 NOTE — Progress Notes (Signed)
Subjective: She was sleeping this morning and complained of feet pain upon awakening. I reviewed with her the reassuring results from yesterday regarding her MRI.  Antibiotics:  Anti-infectives    Start     Dose/Rate Route Frequency Ordered Stop   09/05/16 1000  levofloxacin (LEVAQUIN) tablet 750 mg     750 mg Oral Daily 09/04/16 1519     09/01/16 0800  vancomycin (VANCOCIN) IVPB 1000 mg/200 mL premix  Status:  Discontinued     1,000 mg 200 mL/hr over 60 Minutes Intravenous Every 12 hours 09/01/16 0731 09/02/16 1110   08/30/16 0800  levofloxacin (LEVAQUIN) IVPB 750 mg  Status:  Discontinued     750 mg 100 mL/hr over 90 Minutes Intravenous Every 24 hours 08/29/16 0545 09/04/16 1519   08/29/16 1800  vancomycin (VANCOCIN) IVPB 750 mg/150 ml premix  Status:  Discontinued     750 mg 150 mL/hr over 60 Minutes Intravenous Every 12 hours 08/29/16 0545 09/01/16 0731   08/29/16 0600  aztreonam (AZACTAM) 2 g in dextrose 5 % 50 mL IVPB  Status:  Discontinued     2 g 100 mL/hr over 30 Minutes Intravenous Every 8 hours 08/29/16 0402 08/29/16 1558   08/29/16 0415  levofloxacin (LEVAQUIN) IVPB 750 mg    Comments:  sepsis   750 mg 100 mL/hr over 90 Minutes Intravenous  Once 08/29/16 0402 08/29/16 0826   08/29/16 0400  aztreonam (AZACTAM) injection 1 g  Status:  Discontinued     1 g Intramuscular Every 8 hours 08/29/16 0312 08/29/16 0539   08/29/16 0330  vancomycin (VANCOCIN) 1,500 mg in sodium chloride 0.9 % 500 mL IVPB     1,500 mg 250 mL/hr over 120 Minutes Intravenous  Once 08/29/16 0304 08/29/16 1019   08/29/16 0315  levofloxacin (LEVAQUIN) tablet 750 mg  Status:  Discontinued     750 mg Oral  Once 08/29/16 0304 08/29/16 0402   08/29/16 0130  vancomycin (VANCOCIN) 1,500 mg in sodium chloride 0.9 % 500 mL IVPB  Status:  Discontinued     1,500 mg 250 mL/hr over 120 Minutes Intravenous  Once 08/29/16 0116 08/29/16 0304   08/29/16 0115  levofloxacin (LEVAQUIN) IVPB 750 mg  Status:   Discontinued     750 mg 100 mL/hr over 90 Minutes Intravenous  Once 08/29/16 0108 08/29/16 0304   08/29/16 0115  aztreonam (AZACTAM) 2 g in dextrose 5 % 50 mL IVPB  Status:  Discontinued     2 g 100 mL/hr over 30 Minutes Intravenous  Once 08/29/16 0108 08/29/16 0312   08/29/16 0115  vancomycin (VANCOCIN) IVPB 1000 mg/200 mL premix  Status:  Discontinued     1,000 mg 200 mL/hr over 60 Minutes Intravenous  Once 08/29/16 0108 08/29/16 0116      Medications: Scheduled Meds: . amLODipine  10 mg Oral Daily  . atorvastatin  20 mg Oral Daily  . carbamide peroxide  5 drop Both Ears BID  . carvedilol  12.5 mg Oral BID WC  . clopidogrel  75 mg Oral Q breakfast  . enoxaparin (LOVENOX) injection  40 mg Subcutaneous Daily  . feeding supplement (GLUCERNA SHAKE)  237 mL Oral TID BM  . furosemide  40 mg Oral BID  . insulin aspart  0-9 Units Subcutaneous Q4H  . insulin aspart  3 Units Subcutaneous TID WC  . insulin glargine  27 Units Subcutaneous QHS  . isosorbide mononitrate  60 mg Oral Daily  . levofloxacin  750 mg Oral Daily  . multivitamin with minerals  1 tablet Oral Daily  . mupirocin cream   Topical Daily  . ranolazine  500 mg Oral BID  . sodium chloride flush  10-20 mL Intravenous Q12H   Continuous Infusions: PRN Meds:.acetaminophen, acetaminophen, albuterol, hydrALAZINE, LORazepam, ondansetron (ZOFRAN) IV, polyethylene glycol, prochlorperazine, sodium chloride, sodium chloride flush, traMADol    Objective: Weight change: -1 lb 4.8 oz (-0.59 kg)  Intake/Output Summary (Last 24 hours) at 09/13/16 1234 Last data filed at 09/13/16 0000  Gross per 24 hour  Intake              240 ml  Output                0 ml  Net              240 ml   Blood pressure (!) 160/54, pulse 77, temperature 98.8 F (37.1 C), temperature source Oral, resp. rate 16, height 5\' 4"  (1.626 m), weight 212 lb 9.6 oz (96.4 kg), SpO2 96 %. Temp:  [97.6 F (36.4 C)-98.8 F (37.1 C)] 98.8 F (37.1 C) (02/09  0421) Pulse Rate:  [77-78] 77 (02/09 0421) Resp:  [15-17] 16 (02/09 0421) BP: (138-169)/(40-57) 160/54 (02/09 0430) SpO2:  [94 %-96 %] 96 % (02/09 0421) Weight:  [212 lb 9.6 oz (96.4 kg)] 212 lb 9.6 oz (96.4 kg) (02/09 0500)  Physical Exam: General: middle aged woman, resting in bed HEENT: PERRL, EOMI, no scleral icterus, oropharynx clear Cardiac: RRR, no rubs, murmurs or gallops Pulm: clear to auscultation bilaterally in the anterior lung fields, no wheezes, rales, or rhonchi Abd: soft, nontender, nondistended, BS present Ext: RLE wounds covered in foam bandage, 1st and 2nd toe discoloration stable from yesterday, onychomycosis of multiple nail beds Neuro: responds to questions appropriately; alert and oriented x 3   BMET  Recent Labs  09/12/16 1202 09/13/16 0344  NA 133* 133*  K 3.8 3.5  CL 92* 93*  CO2 32 29  GLUCOSE 250* 150*  BUN 7 7  CREATININE 0.79 0.79  CALCIUM 9.2 9.4     Liver Panel   Recent Labs  09/12/16 1202  PROT 5.7*  ALBUMIN 2.7*  AST 18  ALT 13*  ALKPHOS 56  BILITOT 0.9       Sedimentation Rate No results for input(s): ESRSEDRATE in the last 72 hours. C-Reactive Protein No results for input(s): CRP in the last 72 hours.  Micro Results: Recent Results (from the past 720 hour(s))  Blood Culture (routine x 2)     Status: Abnormal   Collection Time: 08/29/16  1:27 AM  Result Value Ref Range Status   Specimen Description BLOOD LEFT ARM  Final   Special Requests BLOOD 5CC  Final   Culture  Setup Time   Final    GRAM POSITIVE COCCI IN CHAINS IN BOTH AEROBIC AND ANAEROBIC BOTTLES CRITICAL RESULT CALLED TO, READ BACK BY AND VERIFIED WITH: J. Arminger Pharm.D. 15:40 08/29/16  ([email or web address])    Culture GROUP B STREP(S.AGALACTIAE)ISOLATED (A)  Final   Report Status 08/31/2016 FINAL  Final   Organism ID, Bacteria GROUP B STREP(S.AGALACTIAE)ISOLATED  Final      Susceptibility   Group b strep(s.agalactiae)isolated - MIC*    CLINDAMYCIN >=1  RESISTANT Resistant     AMPICILLIN <=0.25 SENSITIVE Sensitive     ERYTHROMYCIN >=8 RESISTANT Resistant     VANCOMYCIN 0.5 SENSITIVE Sensitive     CEFTRIAXONE <=0.12 SENSITIVE Sensitive     LEVOFLOXACIN 1  SENSITIVE Sensitive     * GROUP B STREP(S.AGALACTIAE)ISOLATED  Blood Culture ID Panel (Reflexed)     Status: Abnormal   Collection Time: 08/29/16  1:27 AM  Result Value Ref Range Status   Enterococcus species NOT DETECTED NOT DETECTED Final   Listeria monocytogenes NOT DETECTED NOT DETECTED Final   Staphylococcus species NOT DETECTED NOT DETECTED Final   Staphylococcus aureus NOT DETECTED NOT DETECTED Final   Streptococcus species DETECTED (A) NOT DETECTED Final    Comment: CRITICAL RESULT CALLED TO, READ BACK BY AND VERIFIED WITH: J. Arminger Pharm.D. 15:40 08/29/16 ([email or web address])    Streptococcus agalactiae DETECTED (A) NOT DETECTED Final    Comment: CRITICAL RESULT CALLED TO, READ BACK BY AND VERIFIED WITH: J. Arminger Pharm.D. 15:40 08/29/16 ([email or web address])    Streptococcus pneumoniae NOT DETECTED NOT DETECTED Final   Streptococcus pyogenes NOT DETECTED NOT DETECTED Final   Acinetobacter baumannii NOT DETECTED NOT DETECTED Final   Enterobacteriaceae species NOT DETECTED NOT DETECTED Final   Enterobacter cloacae complex NOT DETECTED NOT DETECTED Final   Escherichia coli NOT DETECTED NOT DETECTED Final   Klebsiella oxytoca NOT DETECTED NOT DETECTED Final   Klebsiella pneumoniae NOT DETECTED NOT DETECTED Final   Proteus species NOT DETECTED NOT DETECTED Final   Serratia marcescens NOT DETECTED NOT DETECTED Final   Haemophilus influenzae NOT DETECTED NOT DETECTED Final   Neisseria meningitidis NOT DETECTED NOT DETECTED Final   Pseudomonas aeruginosa NOT DETECTED NOT DETECTED Final   Candida albicans NOT DETECTED NOT DETECTED Final   Candida glabrata NOT DETECTED NOT DETECTED Final   Candida krusei NOT DETECTED NOT DETECTED Final   Candida parapsilosis NOT DETECTED NOT DETECTED Final    Candida tropicalis NOT DETECTED NOT DETECTED Final  Blood Culture (routine x 2)     Status: Abnormal   Collection Time: 08/29/16  3:18 AM  Result Value Ref Range Status   Specimen Description BLOOD LEFT ARM  Final   Special Requests IN PEDIATRIC BOTTLE 2ML  Final   Culture  Setup Time   Final    GRAM POSITIVE COCCI IN CHAINS IN PEDIATRIC BOTTLE CRITICAL VALUE NOTED.  VALUE IS CONSISTENT WITH PREVIOUSLY REPORTED AND CALLED VALUE.    Culture (A)  Final    GROUP B STREP(S.AGALACTIAE)ISOLATED SUSCEPTIBILITIES PERFORMED ON PREVIOUS CULTURE WITHIN THE LAST 5 DAYS.    Report Status 08/31/2016 FINAL  Final  Urine culture     Status: None   Collection Time: 08/29/16  6:15 PM  Result Value Ref Range Status   Specimen Description URINE, CATHETERIZED  Final   Special Requests NONE  Final   Culture NO GROWTH  Final   Report Status 08/30/2016 FINAL  Final  MRSA PCR Screening     Status: None   Collection Time: 08/29/16  8:06 PM  Result Value Ref Range Status   MRSA by PCR NEGATIVE NEGATIVE Final    Comment:        The GeneXpert MRSA Assay (FDA approved for NASAL specimens only), is one component of a comprehensive MRSA colonization surveillance program. It is not intended to diagnose MRSA infection nor to guide or monitor treatment for MRSA infections.   Culture, blood (Routine X 2) w Reflex to ID Panel     Status: None   Collection Time: 08/30/16  1:48 PM  Result Value Ref Range Status   Specimen Description BLOOD LEFT ARM  Final   Special Requests IN PEDIATRIC BOTTLE .5CC  Final   Culture NO GROWTH 5 DAYS  Final  Report Status 09/04/2016 FINAL  Final  Culture, blood (Routine X 2) w Reflex to ID Panel     Status: None   Collection Time: 08/30/16  1:48 PM  Result Value Ref Range Status   Specimen Description BLOOD LEFT ARM  Final   Special Requests IN PEDIATRIC BOTTLE Ewa Gentry  Final   Culture NO GROWTH 5 DAYS  Final   Report Status 09/04/2016 FINAL  Final    Studies/Results: Mr  Foot Right W Wo Contrast  Result Date: 09/12/2016 CLINICAL DATA:  Bacteremia. Multiple ulcers of the right lower leg, ankle and foot EXAM: MRI OF THE RIGHT FOREFOOT WITHOUT AND WITH CONTRAST; MRI OF LOWER RIGHT EXTREMITY WITHOUT AND WITH CONTRAST TECHNIQUE: Multiplanar, multisequence MR imaging of the right foot was performed before and after the administration of intravenous contrast. Multiplanar, multisequence MR imaging of the right lower leg was performed before and after the administration of intravenous contrast. CONTRAST:  52mL MULTIHANCE GADOBENATE DIMEGLUMINE 529 MG/ML IV SOLN COMPARISON:  None. FINDINGS: TENDONS Peroneal: Peroneal longus tendon intact. Peroneal brevis intact. Posteromedial: Posterior tibial tendon intact. Flexor hallucis longus tendon intact. Flexor digitorum longus tendon intact. Anterior: Tibialis anterior tendon intact. Extensor hallucis longus tendon intact Extensor digitorum longus tendon intact. Achilles:  Intact. Plantar Fascia: Mild focal thickening of the medial band of the plantar fascia at the calcaneal insertion concerning for mild plantar fasciitis. LIGAMENTS Lateral: Anterior talofibular ligament intact. Calcaneofibular ligament intact. Posterior talofibular ligament intact. Anterior and posterior tibiofibular ligaments intact. Medial: Deltoid ligament intact. Spring ligament intact. Lisfranc: Intact. CARTILAGE Ankle Joint: No joint effusion. Normal ankle mortise. No chondral defect. Subtalar Joints/Sinus Tarsi: Normal subtalar joints. No subtalar joint effusion. Normal sinus tarsi. Bones: No acute fracture dislocation. Prior arthrodesis of the first MTP joint. No focal marrow signal abnormality. No periosteal reaction or bone destruction. Soft Tissue: Severe to atrophy diffusely throughout the plantar musculature. No focal fluid collection or hematoma. Generalized soft tissue edema throughout the right foot and right lower leg with enhancement concerning for cellulitis.  Mild soft tissue edema of the left lower leg. IMPRESSION: 1. Generalized soft tissue edema throughout the right foot and right lower leg with enhancement concerning for cellulitis. No drainable fluid collection to suggest an abscess. 2. No osteomyelitis of the right lower leg or right ankle and foot. 3. Mild focal thickening of the medial band of the plantar fascia at the calcaneal insertion concerning for mild plantar fasciitis. Electronically Signed   By: Kathreen Devoid   On: 09/12/2016 14:58   Mr Tibia Fibula Right W Wo Contrast  Result Date: 09/12/2016 CLINICAL DATA:  Bacteremia. Multiple ulcers of the right lower leg, ankle and foot EXAM: MRI OF THE RIGHT FOREFOOT WITHOUT AND WITH CONTRAST; MRI OF LOWER RIGHT EXTREMITY WITHOUT AND WITH CONTRAST TECHNIQUE: Multiplanar, multisequence MR imaging of the right foot was performed before and after the administration of intravenous contrast. Multiplanar, multisequence MR imaging of the right lower leg was performed before and after the administration of intravenous contrast. CONTRAST:  64mL MULTIHANCE GADOBENATE DIMEGLUMINE 529 MG/ML IV SOLN COMPARISON:  None. FINDINGS: TENDONS Peroneal: Peroneal longus tendon intact. Peroneal brevis intact. Posteromedial: Posterior tibial tendon intact. Flexor hallucis longus tendon intact. Flexor digitorum longus tendon intact. Anterior: Tibialis anterior tendon intact. Extensor hallucis longus tendon intact Extensor digitorum longus tendon intact. Achilles:  Intact. Plantar Fascia: Mild focal thickening of the medial band of the plantar fascia at the calcaneal insertion concerning for mild plantar fasciitis. LIGAMENTS Lateral: Anterior talofibular ligament intact. Calcaneofibular ligament  intact. Posterior talofibular ligament intact. Anterior and posterior tibiofibular ligaments intact. Medial: Deltoid ligament intact. Spring ligament intact. Lisfranc: Intact. CARTILAGE Ankle Joint: No joint effusion. Normal ankle mortise. No  chondral defect. Subtalar Joints/Sinus Tarsi: Normal subtalar joints. No subtalar joint effusion. Normal sinus tarsi. Bones: No acute fracture dislocation. Prior arthrodesis of the first MTP joint. No focal marrow signal abnormality. No periosteal reaction or bone destruction. Soft Tissue: Severe to atrophy diffusely throughout the plantar musculature. No focal fluid collection or hematoma. Generalized soft tissue edema throughout the right foot and right lower leg with enhancement concerning for cellulitis. Mild soft tissue edema of the left lower leg. IMPRESSION: 1. Generalized soft tissue edema throughout the right foot and right lower leg with enhancement concerning for cellulitis. No drainable fluid collection to suggest an abscess. 2. No osteomyelitis of the right lower leg or right ankle and foot. 3. Mild focal thickening of the medial band of the plantar fascia at the calcaneal insertion concerning for mild plantar fasciitis. Electronically Signed   By: Kathreen Devoid   On: 09/12/2016 14:58      Assessment/Plan:  Principal Problem:   Acute encephalopathy Active Problems:   DM2 (diabetes mellitus, type 2) ([place])   Atherosclerosis of native arteries of the extremities with ulceration (HCC)   Essential hypertension   Ulcer of right lower extremity (HCC)   Sepsis ([place])   Bilateral pneumonia   Bacteremia   Sepsis due to Streptococcus, group B ([place])   Multiple open wounds of lower leg   PVD (peripheral vascular disease) (HCC)  [name] is a 76 y.o. 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  Group B Strep bacteremia: Likely from chronic skin wounds. No fevers overnight. -Continue levofloxacin [Abx Day 14] given penicillin allergy -Await TEE to r/o endocarditis    LOS: 15 days   Charlott Rakes 09/13/2016, 12:34 PM

## 2016-09-13 NOTE — Progress Notes (Signed)
PT Cancellation Note  Patient Details Name: [name] MRN: [id number] DOB: 1940-11-14   Cancelled Treatment:    Reason Eval/Treat Not Completed: Fatigue/lethargy limiting ability to participate.  Pt had not eaten this AM, husband asked PT to wait for her meal.  After receiving lunch, pt decided she was too tired to attempt therapy.  Will try again Monday as pt can tolerate.   Ramond Dial 09/13/2016, 1:36 PM   Mee Hives, PT MS Acute Rehab Dept. Number: [place] and [place]

## 2016-09-13 NOTE — Clinical Social Work Note (Signed)
Pt is not ready for discharge today. CSW spoke with pt's husband and daughter, who are aware and agreeable to plan. CSW updated facility. CSW also spoke with [facility], Josem Kaufmann is good through Saturday. However, on Monday, Navi will need updated therapy notes and medical notes. CSW will continue to follow.   Darden Dates, MSW, LCSW  Clinical social Worker [phone]

## 2016-09-13 NOTE — Progress Notes (Signed)
      INFECTIOUS DISEASE ATTENDING ADDENDUM:   Date: 09/13/2016  Patient name: [name]  Medical record number: [id number]  Date of birth: Sep 14, 1940    This patient has been seen and discussed with the house staff. Please see the resident's note for complete details. I concur with their findings with the following additions/corrections:  Patient resting comfortably today. Films and labs reviewed.  We will need a transesophageal echocardiogram to make sure that she does not have endocarditis prior to making a decision about duration of antibiotics.  Dr. Megan Salon available this weekend for questions.   Rhina Brackett [place] 09/13/2016, 12:19 PM

## 2016-09-13 NOTE — Progress Notes (Signed)
   Pt was notified of scheduled TEE for bacteremia on 09/16/16. She was informed regarding the indication. We dicussed procedure details as well as potential risk. I also spoke with her husband via phone with same information. Attempted to call her daughter, Zauria P[place]enbarger , [place], but unable to reach. RN to have daughter sign consent over the weekend.     [name] 09/13/2016

## 2016-09-14 LAB — GLUCOSE, CAPILLARY
GLUCOSE-CAPILLARY: 150 mg/dL — AB (ref 65–99)
Glucose-Capillary: 152 mg/dL — ABNORMAL HIGH (ref 65–99)
Glucose-Capillary: 80 mg/dL (ref 65–99)
Glucose-Capillary: 171 mg/dL — ABNORMAL HIGH (ref 65–99)
Glucose-Capillary: 185 mg/dL — ABNORMAL HIGH (ref 65–99)

## 2016-09-14 MED ORDER — POTASSIUM CHLORIDE CRYS ER 20 MEQ PO TBCR
20.0000 meq | EXTENDED_RELEASE_TABLET | Freq: Two times a day (BID) | ORAL | Status: AC
  Administered 2016-09-14 (×2): 20 meq via ORAL
  Filled 2016-09-14 (×2): qty 1

## 2016-09-14 MED ORDER — POLYETHYLENE GLYCOL 3350 17 G PO PACK
17.0000 g | PACK | Freq: Two times a day (BID) | ORAL | Status: DC
  Administered 2016-09-14 – 2016-09-15 (×4): 17 g via ORAL
  Filled 2016-09-14 (×5): qty 1

## 2016-09-14 NOTE — Progress Notes (Signed)
PROGRESS NOTE    [name]  [id number] DOB: 04-Sep-1940 DOA: 08/28/2016 PCP: [name], MD    Brief Narrative:  Briefly, complex vascular patient with history of PAD, DM, HTN, prior CVA with residual right sided weakness admitted with sepsis due to pneumonia and bacteremia. She was fluid resuscitated on admission due to sepsis,  required IV diuresis due to Pulmonary edema and acute on chronic dCHF  Assessment & Plan:   Principal Problem:   Acute encephalopathy Active Problems:   DM2 (diabetes mellitus, type 2) (HCC)   Atherosclerosis of native arteries of the extremities with ulceration (HCC)   Essential hypertension   Ulcer of right lower extremity (HCC)   Sepsis ([place])   Bilateral pneumonia   Bacteremia   Sepsis due to Streptococcus, group B ([place])   Multiple open wounds of lower leg   PVD (peripheral vascular disease) ([place])   Sepsis due to Group B strep bacteremia - Blood cultures were positive for group B strep, continue Levaquin alone as is sensitive based on antibiogram.  Started on levofloxacin 1/25. 2-D echo 1/29 did not show any evidence of endocarditis. ID consulted for further recommendations for antibiotic management - Surveillance cultures obtained 1/26, no growth to date - Patient remains on PO levaquin since  1/25, as per infectious disease group B streptococcal bacteremia probably originated from one of her open wounds.  ID concerned about deep infection of the bones in the right ankle and foot and  MRI no evidence of deep infection such as an abscess or septic joint or osteomyelitis Infectious disease recommends TEE to rule out endocarditis scheduled TEE for bacteremia on 09/16/16  Acute hypoxic respiratory failure due to HCAP and Pulmonary edema - Likely due to fluid resuscitation in the setting of sepsis. Chest x-ray showed slight pulmonary vascular congestion versus worsening of her pneumonia.  Breathing is improving Changed Lasix to by mouth  2/7, Documented bodyweight likely inaccurate as the patient has not lost any weight since 1/25 but the swelling has definitely improved Continue to monitor renal function which remains stable, avoid daily blood work as patient refuses to be stuck   Acute on chronic diastolic CHF - Most recent 2-D echo was done roughly 6 months ago on July 2017 and showed an ejection fraction of 55-60% as well as grade 2 diastolic dysfunction.  - Patient now fluid overload in the setting of fluid resuscitation due to sepsis - repeat 2d echo showed normal EF, and grade 1 diastolic dysfunction - For now, will continue by mouth Lasix   Hypokalemia-replete and check magnesium levels  HCAP - secondary to strep pneumonia - Will continue on oral abx as per above  Acute encephalopathy  - Appears to be slowly resolving, suspect secondary to #1 and #2  - Alert, conversant, appears to interact appropriately - Stable  Hypertension  - Continued on Coreg, Norvasc - Remains stable at present  Hyperlipidemia  - Will continue statin as tolerated - Currently stable  Type 2 diabetes mellitus  - For now, will continue lantus and sliding scale, improving - glucose remains in the mid-100's    Right lower extremity ulcer  - wound care was consulted, ID consulted - remains stable there is no evidence of deep infection such as an abscess or septic joint or osteomyelitis  Peripheral artery disease  - Patient is continued on Plavix  - Currently remains stable  Constipation Will start patient on scheduled doses of MiraLAX Trial of Dulcolax suppository Status post receiving SMOG  enema, With response   Status: Full Family Communication: discussed with husband  Disposition Plan:  Will need SNF, anticipate discharge Monday after TEE,    Consultants:   ID  Procedures:   2D echo Impressions: - Normal LV size with moderate LV hypertrophy. EF 60-65%. Normal RVsize and systolic function. Mild to  moderate aortic stenosis(moderate visually and by calculated valve area, mild by meangradient).  Antimicrobials: Anti-infectives    Start     Dose/Rate Route Frequency Ordered Stop   09/05/16 1000  levofloxacin (LEVAQUIN) tablet 750 mg     750 mg Oral Daily 09/04/16 1519     09/01/16 0800  vancomycin (VANCOCIN) IVPB 1000 mg/200 mL premix  Status:  Discontinued     1,000 mg 200 mL/hr over 60 Minutes Intravenous Every 12 hours 09/01/16 0731 09/02/16 1110   08/30/16 0800  levofloxacin (LEVAQUIN) IVPB 750 mg  Status:  Discontinued     750 mg 100 mL/hr over 90 Minutes Intravenous Every 24 hours 08/29/16 0545 09/04/16 1519   08/29/16 1800  vancomycin (VANCOCIN) IVPB 750 mg/150 ml premix  Status:  Discontinued     750 mg 150 mL/hr over 60 Minutes Intravenous Every 12 hours 08/29/16 0545 09/01/16 0731   08/29/16 0600  aztreonam (AZACTAM) 2 g in dextrose 5 % 50 mL IVPB  Status:  Discontinued     2 g 100 mL/hr over 30 Minutes Intravenous Every 8 hours 08/29/16 0402 08/29/16 1558   08/29/16 0415  levofloxacin (LEVAQUIN) IVPB 750 mg    Comments:  sepsis   750 mg 100 mL/hr over 90 Minutes Intravenous  Once 08/29/16 0402 08/29/16 0826   08/29/16 0400  aztreonam (AZACTAM) injection 1 g  Status:  Discontinued     1 g Intramuscular Every 8 hours 08/29/16 0312 08/29/16 0539   08/29/16 0330  vancomycin (VANCOCIN) 1,500 mg in sodium chloride 0.9 % 500 mL IVPB     1,500 mg 250 mL/hr over 120 Minutes Intravenous  Once 08/29/16 0304 08/29/16 1019   08/29/16 0315  levofloxacin (LEVAQUIN) tablet 750 mg  Status:  Discontinued     750 mg Oral  Once 08/29/16 0304 08/29/16 0402   08/29/16 0130  vancomycin (VANCOCIN) 1,500 mg in sodium chloride 0.9 % 500 mL IVPB  Status:  Discontinued     1,500 mg 250 mL/hr over 120 Minutes Intravenous  Once 08/29/16 0116 08/29/16 0304   08/29/16 0115  levofloxacin (LEVAQUIN) IVPB 750 mg  Status:  Discontinued     750 mg 100 mL/hr over 90 Minutes Intravenous  Once 08/29/16  0108 08/29/16 0304   08/29/16 0115  aztreonam (AZACTAM) 2 g in dextrose 5 % 50 mL IVPB  Status:  Discontinued     2 g 100 mL/hr over 30 Minutes Intravenous  Once 08/29/16 0108 08/29/16 0312   08/29/16 0115  vancomycin (VANCOCIN) IVPB 1000 mg/200 mL premix  Status:  Discontinued     1,000 mg 200 mL/hr over 60 Minutes Intravenous  Once 08/29/16 0108 08/29/16 0116      Subjective: She was afebrile overnight, sleepy, no nausea, cp, sob  Objective: Vitals:   09/13/16 1346 09/13/16 1825 09/13/16 1938 09/14/16 0558  BP: (!) 130/45 (!) 157/50 (!) 147/47 (!) 150/57  Pulse: 72 70 71 70  Resp: 16 15 16 12   Temp: 97.7 F (36.5 C)  97.7 F (36.5 C) 97.6 F (36.4 C)  TempSrc: Oral  Oral Oral  SpO2: 97% 99% 100% 99%  Weight:    97.1 kg (214 lb)  Height:       No intake or output data in the 24 hours ending 09/14/16 0800 Filed Weights   09/12/16 0439 09/13/16 0500 09/14/16 0558  Weight: 97 kg (213 lb 14.4 oz) 96.4 kg (212 lb 9.6 oz) 97.1 kg (214 lb)    Examination:  General exam: in nad, awake, conversant Respiratory system: normal resp effort, no audible wheezing Cardiovascular system: regular rhythm, s1-s2 Gastrointestinal system: mildly distended, pos BS, nondistended Central nervous system: no seizures, no tremors Extremities: perfused, no clubbing Skin: no rashes//no pallor Psychiatry: affect normal// no auditory hallucinations.   Data Reviewed: I have personally reviewed following labs and imaging studies  CBC:  Recent Labs Lab 09/12/16 1202  WBC 9.2  HGB 10.2*  HCT 32.5*  MCV 79.1  PLT 123456*   Basic Metabolic Panel:  Recent Labs Lab 09/09/16 0440 09/10/16 0845 09/11/16 0538 09/12/16 1202 09/13/16 0344  NA 134* 134* 132* 133* 133*  K 3.0* 4.2 3.0* 3.8 3.5  CL 91* 91* 87* 92* 93*  CO2 36* 29 34* 32 29  GLUCOSE 187* 155* 158* 250* 150*  BUN 9 10 9 7 7   CREATININE 0.79 0.88 0.85 0.79 0.79  CALCIUM 9.0 9.3 9.0 9.2 9.4  MG  --   --  2.2  --   --     GFR: Estimated Creatinine Clearance: 70.9 mL/min (by C-G formula based on SCr of 0.79 mg/dL). Liver Function Tests:  Recent Labs Lab 09/12/16 1202  AST 18  ALT 13*  ALKPHOS 56  BILITOT 0.9  PROT 5.7*  ALBUMIN 2.7*   No results for input(s): LIPASE, AMYLASE in the last 168 hours. No results for input(s): AMMONIA in the last 168 hours. Coagulation Profile: No results for input(s): INR, PROTIME in the last 168 hours. Cardiac Enzymes: No results for input(s): CKTOTAL, CKMB, CKMBINDEX, TROPONINI in the last 168 hours. BNP (last 3 results) No results for input(s): PROBNP in the last 8760 hours. HbA1C: No results for input(s): HGBA1C in the last 72 hours. CBG:  Recent Labs Lab 09/13/16 1610 09/13/16 1936 09/13/16 2309 09/14/16 0316 09/14/16 0738  GLUCAP 227* 228* 192* 152* 80   Lipid Profile: No results for input(s): CHOL, HDL, LDLCALC, TRIG, CHOLHDL, LDLDIRECT in the last 72 hours. Thyroid Function Tests: No results for input(s): TSH, T4TOTAL, FREET4, T3FREE, THYROIDAB in the last 72 hours. Anemia Panel: No results for input(s): VITAMINB12, FOLATE, FERRITIN, TIBC, IRON, RETICCTPCT in the last 72 hours. Sepsis Labs: No results for input(s): PROCALCITON, LATICACIDVEN in the last 168 hours.  No results found for this or any previous visit (from the past 240 hour(s)).   Radiology Studies: Mr Foot Right W Wo Contrast  Result Date: 09/12/2016 CLINICAL DATA:  Bacteremia. Multiple ulcers of the right lower leg, ankle and foot EXAM: MRI OF THE RIGHT FOREFOOT WITHOUT AND WITH CONTRAST; MRI OF LOWER RIGHT EXTREMITY WITHOUT AND WITH CONTRAST TECHNIQUE: Multiplanar, multisequence MR imaging of the right foot was performed before and after the administration of intravenous contrast. Multiplanar, multisequence MR imaging of the right lower leg was performed before and after the administration of intravenous contrast. CONTRAST:  54mL MULTIHANCE GADOBENATE DIMEGLUMINE 529 MG/ML IV SOLN  COMPARISON:  None. FINDINGS: TENDONS Peroneal: Peroneal longus tendon intact. Peroneal brevis intact. Posteromedial: Posterior tibial tendon intact. Flexor hallucis longus tendon intact. Flexor digitorum longus tendon intact. Anterior: Tibialis anterior tendon intact. Extensor hallucis longus tendon intact Extensor digitorum longus tendon intact. Achilles:  Intact. Plantar Fascia: Mild focal thickening of the medial band  of the plantar fascia at the calcaneal insertion concerning for mild plantar fasciitis. LIGAMENTS Lateral: Anterior talofibular ligament intact. Calcaneofibular ligament intact. Posterior talofibular ligament intact. Anterior and posterior tibiofibular ligaments intact. Medial: Deltoid ligament intact. Spring ligament intact. Lisfranc: Intact. CARTILAGE Ankle Joint: No joint effusion. Normal ankle mortise. No chondral defect. Subtalar Joints/Sinus Tarsi: Normal subtalar joints. No subtalar joint effusion. Normal sinus tarsi. Bones: No acute fracture dislocation. Prior arthrodesis of the first MTP joint. No focal marrow signal abnormality. No periosteal reaction or bone destruction. Soft Tissue: Severe to atrophy diffusely throughout the plantar musculature. No focal fluid collection or hematoma. Generalized soft tissue edema throughout the right foot and right lower leg with enhancement concerning for cellulitis. Mild soft tissue edema of the left lower leg. IMPRESSION: 1. Generalized soft tissue edema throughout the right foot and right lower leg with enhancement concerning for cellulitis. No drainable fluid collection to suggest an abscess. 2. No osteomyelitis of the right lower leg or right ankle and foot. 3. Mild focal thickening of the medial band of the plantar fascia at the calcaneal insertion concerning for mild plantar fasciitis. Electronically Signed   By: Kathreen Devoid   On: 09/12/2016 14:58   Mr Tibia Fibula Right W Wo Contrast  Result Date: 09/12/2016 CLINICAL DATA:  Bacteremia.  Multiple ulcers of the right lower leg, ankle and foot EXAM: MRI OF THE RIGHT FOREFOOT WITHOUT AND WITH CONTRAST; MRI OF LOWER RIGHT EXTREMITY WITHOUT AND WITH CONTRAST TECHNIQUE: Multiplanar, multisequence MR imaging of the right foot was performed before and after the administration of intravenous contrast. Multiplanar, multisequence MR imaging of the right lower leg was performed before and after the administration of intravenous contrast. CONTRAST:  19mL MULTIHANCE GADOBENATE DIMEGLUMINE 529 MG/ML IV SOLN COMPARISON:  None. FINDINGS: TENDONS Peroneal: Peroneal longus tendon intact. Peroneal brevis intact. Posteromedial: Posterior tibial tendon intact. Flexor hallucis longus tendon intact. Flexor digitorum longus tendon intact. Anterior: Tibialis anterior tendon intact. Extensor hallucis longus tendon intact Extensor digitorum longus tendon intact. Achilles:  Intact. Plantar Fascia: Mild focal thickening of the medial band of the plantar fascia at the calcaneal insertion concerning for mild plantar fasciitis. LIGAMENTS Lateral: Anterior talofibular ligament intact. Calcaneofibular ligament intact. Posterior talofibular ligament intact. Anterior and posterior tibiofibular ligaments intact. Medial: Deltoid ligament intact. Spring ligament intact. Lisfranc: Intact. CARTILAGE Ankle Joint: No joint effusion. Normal ankle mortise. No chondral defect. Subtalar Joints/Sinus Tarsi: Normal subtalar joints. No subtalar joint effusion. Normal sinus tarsi. Bones: No acute fracture dislocation. Prior arthrodesis of the first MTP joint. No focal marrow signal abnormality. No periosteal reaction or bone destruction. Soft Tissue: Severe to atrophy diffusely throughout the plantar musculature. No focal fluid collection or hematoma. Generalized soft tissue edema throughout the right foot and right lower leg with enhancement concerning for cellulitis. Mild soft tissue edema of the left lower leg. IMPRESSION: 1. Generalized soft  tissue edema throughout the right foot and right lower leg with enhancement concerning for cellulitis. No drainable fluid collection to suggest an abscess. 2. No osteomyelitis of the right lower leg or right ankle and foot. 3. Mild focal thickening of the medial band of the plantar fascia at the calcaneal insertion concerning for mild plantar fasciitis. Electronically Signed   By: Kathreen Devoid   On: 09/12/2016 14:58    Scheduled Meds: . amLODipine  10 mg Oral Daily  . atorvastatin  20 mg Oral Daily  . carbamide peroxide  5 drop Both Ears BID  . carvedilol  12.5 mg Oral BID WC  .  clopidogrel  75 mg Oral Q breakfast  . enoxaparin (LOVENOX) injection  40 mg Subcutaneous Daily  . feeding supplement (GLUCERNA SHAKE)  237 mL Oral TID BM  . furosemide  40 mg Oral BID  . insulin aspart  0-9 Units Subcutaneous Q4H  . insulin aspart  3 Units Subcutaneous TID WC  . insulin glargine  27 Units Subcutaneous QHS  . isosorbide mononitrate  60 mg Oral Daily  . levofloxacin  750 mg Oral Daily  . multivitamin with minerals  1 tablet Oral Daily  . mupirocin cream   Topical Daily  . ranolazine  500 mg Oral BID  . sodium chloride flush  10-20 mL Intravenous Q12H   Continuous Infusions:   LOS: 16 days   Reyne Dumas, MD [facility] Pager [phone]  If 7PM-7AM, please contact night-coverage [email or web address] Password [email or web address] 09/14/2016, 8:00 AM

## 2016-09-15 LAB — CBC
HEMATOCRIT: 33.8 % — AB (ref 36.0–46.0)
HEMOGLOBIN: 11 g/dL — AB (ref 12.0–15.0)
MCH: 25.5 pg — AB (ref 26.0–34.0)
MCHC: 32.5 g/dL (ref 30.0–36.0)
MCV: 78.4 fL (ref 78.0–100.0)
Platelets: 352 10*3/uL (ref 150–400)
RBC: 4.31 MIL/uL (ref 3.87–5.11)
RDW: 16.3 % — ABNORMAL HIGH (ref 11.5–15.5)
WBC: 12 10*3/uL — ABNORMAL HIGH (ref 4.0–10.5)

## 2016-09-15 LAB — COMPREHENSIVE METABOLIC PANEL
ALBUMIN: 2.6 g/dL — AB (ref 3.5–5.0)
ALK PHOS: 60 U/L (ref 38–126)
ALT: 11 U/L — ABNORMAL LOW (ref 14–54)
ANION GAP: 12 (ref 5–15)
AST: 23 U/L (ref 15–41)
BILIRUBIN TOTAL: 0.6 mg/dL (ref 0.3–1.2)
BUN: 11 mg/dL (ref 6–20)
CALCIUM: 9.6 mg/dL (ref 8.9–10.3)
CO2: 26 mmol/L (ref 22–32)
Chloride: 95 mmol/L — ABNORMAL LOW (ref 101–111)
Creatinine, Ser: 0.98 mg/dL (ref 0.44–1.00)
GFR calc Af Amer: >60 mL/min (ref >60)
GFR, EST NON AFRICAN AMERICAN: 56 mL/min — AB (ref >60)
Glucose, Bld: 169 mg/dL — ABNORMAL HIGH (ref 65–99)
POTASSIUM: 6 mmol/L — AB (ref 3.5–5.1)
Sodium: 133 mmol/L — ABNORMAL LOW (ref 135–145)
TOTAL PROTEIN: 5.9 g/dL — AB (ref 6.5–8.1)

## 2016-09-15 LAB — GLUCOSE, CAPILLARY
GLUCOSE-CAPILLARY: 138 mg/dL — AB (ref 65–99)
Glucose-Capillary: 148 mg/dL — ABNORMAL HIGH (ref 65–99)
Glucose-Capillary: 175 mg/dL — ABNORMAL HIGH (ref 65–99)
Glucose-Capillary: 206 mg/dL — ABNORMAL HIGH (ref 65–99)
Glucose-Capillary: 259 mg/dL — ABNORMAL HIGH (ref 65–99)

## 2016-09-15 MED ORDER — FUROSEMIDE 10 MG/ML IJ SOLN
40.0000 mg | Freq: Once | INTRAMUSCULAR | Status: AC
  Administered 2016-09-15: 40 mg via INTRAVENOUS
  Filled 2016-09-15: qty 4

## 2016-09-15 MED ORDER — SODIUM POLYSTYRENE SULFONATE 15 GM/60ML PO SUSP
30.0000 g | Freq: Once | ORAL | Status: AC
Start: 2016-09-15 — End: 2016-09-15
  Administered 2016-09-15: 30 g via ORAL
  Filled 2016-09-15: qty 120

## 2016-09-15 NOTE — Progress Notes (Signed)
PROGRESS NOTE    [name]  [id number] DOB: May 04, 1941 DOA: 08/28/2016 PCP: [name], MD    Brief Narrative:  Briefly, complex vascular patient with history of PAD, DM, HTN, prior CVA with residual right sided weakness admitted with sepsis due to pneumonia and bacteremia. She was fluid resuscitated on admission due to sepsis,  required IV diuresis due to Pulmonary edema and acute on chronic dCHF  Assessment & Plan:   Principal Problem:   Acute encephalopathy Active Problems:   DM2 (diabetes mellitus, type 2) (HCC)   Atherosclerosis of native arteries of the extremities with ulceration (HCC)   Essential hypertension   Ulcer of right lower extremity (HCC)   Sepsis ([place])   Bilateral pneumonia   Bacteremia   Sepsis due to Streptococcus, group B ([place])   Multiple open wounds of lower leg   PVD (peripheral vascular disease) ([place])   Sepsis due to Group B strep bacteremia - Blood cultures were positive for group B strep, continue Levaquin alone as is sensitive based on antibiogram.  Started on levofloxacin 1/25. 2-D echo 1/29 did not show any evidence of endocarditis. ID consulted for further recommendations and for antibiotic management - Surveillance cultures obtained 1/26, no growth to date - Patient remains on PO levaquin since  1/25, as per infectious disease group B streptococcal bacteremia probably originated from one of her open wounds.  ID concerned about deep infection of the bones in the right ankle and foot and  MRI no evidence of deep infection such as an abscess or septic joint or osteomyelitis Infectious disease recommends TEE to rule out endocarditis scheduled TEE for bacteremia on 09/16/16  Acute hypoxic respiratory failure due to HCAP and Pulmonary edema - Likely due to fluid resuscitation in the setting of sepsis. Chest x-ray showed slight pulmonary vascular congestion versus worsening of her pneumonia.  Breathing is improving, 97%on RA  Changed Lasix to by  mouth 2/7,foley dc'd  Documented bodyweight likely inaccurate as the patient has not lost any weight since 1/25 but the swelling has definitely improved Continue to monitor renal function/potassium     Acute on chronic diastolic CHF - Most recent 2-D echo was done roughly 6 months ago on July 2017 and showed an ejection fraction of 55-60% as well as grade 2 diastolic dysfunction.  - Patient now fluid overload in the setting of fluid resuscitation due to sepsis - repeat 2d echo showed normal EF, and grade 1 diastolic dysfunction - For now, will continue by mouth Lasix   Hypokalemia-replete and check magnesium levels, potassium was 6.0. Patient is to receive Kayexalate and IV Lasix 1 Will recheck potassium in the morning.   HCAP - secondary to strep pneumonia - Will continue on oral abx as per above  Acute encephalopathy  - Appears to be slowly resolving, suspect secondary to #1 and #2  - Alert, conversant, appears to interact appropriately - Stable  Hypertension  - Continued on Coreg, Norvasc - Remains stable at present  Hyperlipidemia  - Will continue statin as tolerated - Currently stable  Type 2 diabetes mellitus  - For now, will continue lantus and sliding scale, improving - glucose remains in the mid-100's    Right lower extremity ulcer  - wound care was consulted, ID consulted - remains stable there is no evidence of deep infection such as an abscess or septic joint or osteomyelitis  Peripheral artery disease  - Patient is continued on Plavix  - Currently remains stable  Constipation Continue MiraLAX,  patient had a large bowel movement yesterday Trial of Dulcolax suppository       Status: Full Family Communication: discussed with husband  Disposition Plan:  Will need SNF, anticipate discharge Monday after TEE,    Consultants:   ID  Procedures:   2D echo Impressions: - Normal LV size with moderate LV hypertrophy. EF 60-65%. Normal RVsize  and systolic function. Mild to moderate aortic stenosis(moderate visually and by calculated valve area, mild by meangradient).  Antimicrobials: Anti-infectives    Start     Dose/Rate Route Frequency Ordered Stop   09/05/16 1000  levofloxacin (LEVAQUIN) tablet 750 mg     750 mg Oral Daily 09/04/16 1519     09/01/16 0800  vancomycin (VANCOCIN) IVPB 1000 mg/200 mL premix  Status:  Discontinued     1,000 mg 200 mL/hr over 60 Minutes Intravenous Every 12 hours 09/01/16 0731 09/02/16 1110   08/30/16 0800  levofloxacin (LEVAQUIN) IVPB 750 mg  Status:  Discontinued     750 mg 100 mL/hr over 90 Minutes Intravenous Every 24 hours 08/29/16 0545 09/04/16 1519   08/29/16 1800  vancomycin (VANCOCIN) IVPB 750 mg/150 ml premix  Status:  Discontinued     750 mg 150 mL/hr over 60 Minutes Intravenous Every 12 hours 08/29/16 0545 09/01/16 0731   08/29/16 0600  aztreonam (AZACTAM) 2 g in dextrose 5 % 50 mL IVPB  Status:  Discontinued     2 g 100 mL/hr over 30 Minutes Intravenous Every 8 hours 08/29/16 0402 08/29/16 1558   08/29/16 0415  levofloxacin (LEVAQUIN) IVPB 750 mg    Comments:  sepsis   750 mg 100 mL/hr over 90 Minutes Intravenous  Once 08/29/16 0402 08/29/16 0826   08/29/16 0400  aztreonam (AZACTAM) injection 1 g  Status:  Discontinued     1 g Intramuscular Every 8 hours 08/29/16 0312 08/29/16 0539   08/29/16 0330  vancomycin (VANCOCIN) 1,500 mg in sodium chloride 0.9 % 500 mL IVPB     1,500 mg 250 mL/hr over 120 Minutes Intravenous  Once 08/29/16 0304 08/29/16 1019   08/29/16 0315  levofloxacin (LEVAQUIN) tablet 750 mg  Status:  Discontinued     750 mg Oral  Once 08/29/16 0304 08/29/16 0402   08/29/16 0130  vancomycin (VANCOCIN) 1,500 mg in sodium chloride 0.9 % 500 mL IVPB  Status:  Discontinued     1,500 mg 250 mL/hr over 120 Minutes Intravenous  Once 08/29/16 0116 08/29/16 0304   08/29/16 0115  levofloxacin (LEVAQUIN) IVPB 750 mg  Status:  Discontinued     750 mg 100 mL/hr over 90  Minutes Intravenous  Once 08/29/16 0108 08/29/16 0304   08/29/16 0115  aztreonam (AZACTAM) 2 g in dextrose 5 % 50 mL IVPB  Status:  Discontinued     2 g 100 mL/hr over 30 Minutes Intravenous  Once 08/29/16 0108 08/29/16 0312   08/29/16 0115  vancomycin (VANCOCIN) IVPB 1000 mg/200 mL premix  Status:  Discontinued     1,000 mg 200 mL/hr over 60 Minutes Intravenous  Once 08/29/16 0108 08/29/16 0116      Subjective: Patient hemodynamically stable, no chest pain, shortness of breath, had a large bowel movement yesterday  Objective: Vitals:   09/14/16 1400 09/14/16 1500 09/14/16 2022 09/14/16 2024  BP:   (!) 102/34 (!) 117/44  Pulse: 78 82 78 76  Resp: 12 20 17 14   Temp:   97.7 F (36.5 C)   TempSrc:   Oral   SpO2: 99% 98% 94%  97%  Weight:      Height:       No intake or output data in the 24 hours ending 09/15/16 0816 Filed Weights   09/12/16 0439 09/13/16 0500 09/14/16 0558  Weight: 97 kg (213 lb 14.4 oz) 96.4 kg (212 lb 9.6 oz) 97.1 kg (214 lb)    Examination:  General exam: in nad, awake, conversant Respiratory system: normal resp effort, no audible wheezing Cardiovascular system: regular rhythm, s1-s2 Gastrointestinal system: mildly distended, pos BS, nondistended Central nervous system: no seizures, no tremors Extremities: perfused, no clubbing Skin: no rashes//no pallor Psychiatry: affect normal// no auditory hallucinations.   Data Reviewed: I have personally reviewed following labs and imaging studies  CBC:  Recent Labs Lab 09/12/16 1202 09/15/16 0443  WBC 9.2 12.0*  HGB 10.2* 11.0*  HCT 32.5* 33.8*  MCV 79.1 78.4  PLT 412* [id number]   Basic Metabolic Panel:  Recent Labs Lab 09/10/16 0845 09/11/16 0538 09/12/16 1202 09/13/16 0344 09/15/16 0443  NA 134* 132* 133* 133* 133*  K 4.2 3.0* 3.8 3.5 6.0*  CL 91* 87* 92* 93* 95*  CO2 29 34* 32 29 26  GLUCOSE 155* 158* 250* 150* 169*  BUN 10 9 7 7 11   CREATININE 0.88 0.85 0.79 0.79 0.98  CALCIUM 9.3 9.0 9.2  9.4 9.6  MG  --  2.2  --   --   --    GFR: Estimated Creatinine Clearance: 57.9 mL/min (by C-G formula based on SCr of 0.98 mg/dL). Liver Function Tests:  Recent Labs Lab 09/12/16 1202 09/15/16 0443  AST 18 23  ALT 13* 11*  ALKPHOS 56 60  BILITOT 0.9 0.6  PROT 5.7* 5.9*  ALBUMIN 2.7* 2.6*   No results for input(s): LIPASE, AMYLASE in the last 168 hours. No results for input(s): AMMONIA in the last 168 hours. Coagulation Profile: No results for input(s): INR, PROTIME in the last 168 hours. Cardiac Enzymes: No results for input(s): CKTOTAL, CKMB, CKMBINDEX, TROPONINI in the last 168 hours. BNP (last 3 results) No results for input(s): PROBNP in the last 8760 hours. HbA1C: No results for input(s): HGBA1C in the last 72 hours. CBG:  Recent Labs Lab 09/14/16 1153 09/14/16 1602 09/14/16 2021 09/15/16 0032 09/15/16 0456  GLUCAP 150* 171* 185* 206* 175*   Lipid Profile: No results for input(s): CHOL, HDL, LDLCALC, TRIG, CHOLHDL, LDLDIRECT in the last 72 hours. Thyroid Function Tests: No results for input(s): TSH, T4TOTAL, FREET4, T3FREE, THYROIDAB in the last 72 hours. Anemia Panel: No results for input(s): VITAMINB12, FOLATE, FERRITIN, TIBC, IRON, RETICCTPCT in the last 72 hours. Sepsis Labs: No results for input(s): PROCALCITON, LATICACIDVEN in the last 168 hours.  No results found for this or any previous visit (from the past 240 hour(s)).   Radiology Studies: No results found.  Scheduled Meds: . amLODipine  10 mg Oral Daily  . atorvastatin  20 mg Oral Daily  . carbamide peroxide  5 drop Both Ears BID  . carvedilol  12.5 mg Oral BID WC  . clopidogrel  75 mg Oral Q breakfast  . enoxaparin (LOVENOX) injection  40 mg Subcutaneous Daily  . feeding supplement (GLUCERNA SHAKE)  237 mL Oral TID BM  . furosemide  40 mg Oral BID  . insulin aspart  0-9 Units Subcutaneous Q4H  . insulin aspart  3 Units Subcutaneous TID WC  . insulin glargine  27 Units Subcutaneous QHS    . isosorbide mononitrate  60 mg Oral Daily  . levofloxacin  750 mg  Oral Daily  . multivitamin with minerals  1 tablet Oral Daily  . mupirocin cream   Topical Daily  . polyethylene glycol  17 g Oral BID  . ranolazine  500 mg Oral BID  . sodium chloride flush  10-20 mL Intravenous Q12H   Continuous Infusions:   LOS: 17 days   Reyne Dumas, MD [facility] Pager [phone]  If 7PM-7AM, please contact night-coverage [email or web address] Password [email or web address] 09/15/2016, 8:16 AM

## 2016-09-16 ENCOUNTER — Other Ambulatory Visit (HOSPITAL_COMMUNITY): Payer: Medicare HMO

## 2016-09-16 ENCOUNTER — Encounter (HOSPITAL_COMMUNITY)
Admission: EM | Disposition: A | Discharge status: Skilled Nursing Facility | Payer: Self-pay | Source: Home / Self Care | Attending: Internal Medicine

## 2016-09-16 ENCOUNTER — Encounter (HOSPITAL_COMMUNITY): Payer: Self-pay | Admitting: *Deleted

## 2016-09-16 LAB — GLUCOSE, CAPILLARY
GLUCOSE-CAPILLARY: 172 mg/dL — AB (ref 65–99)
GLUCOSE-CAPILLARY: 175 mg/dL — AB (ref 65–99)
GLUCOSE-CAPILLARY: 160 mg/dL — AB (ref 65–99)
Glucose-Capillary: 158 mg/dL — ABNORMAL HIGH (ref 65–99)
Glucose-Capillary: 210 mg/dL — ABNORMAL HIGH (ref 65–99)
Glucose-Capillary: 218 mg/dL — ABNORMAL HIGH (ref 65–99)

## 2016-09-16 LAB — BASIC METABOLIC PANEL
Anion gap: 10 (ref 5–15)
BUN: 12 mg/dL (ref 6–20)
CALCIUM: 9 mg/dL (ref 8.9–10.3)
CO2: 26 mmol/L (ref 22–32)
Chloride: 99 mmol/L — ABNORMAL LOW (ref 101–111)
Creatinine, Ser: 0.88 mg/dL (ref 0.44–1.00)
GFR calc Af Amer: >60 mL/min (ref >60)
Glucose, Bld: 180 mg/dL — ABNORMAL HIGH (ref 65–99)
POTASSIUM: 4.5 mmol/L (ref 3.5–5.1)
SODIUM: 135 mmol/L (ref 135–145)

## 2016-09-16 LAB — PROTIME-INR
INR: 1.07
Prothrombin Time: 13.9 seconds (ref 11.4–15.2)

## 2016-09-16 SURGERY — CANCELLED PROCEDURE

## 2016-09-16 MED ORDER — ACETAMINOPHEN 325 MG PO TABS: 650.0000 mg | ORAL_TABLET | Freq: Four times a day (QID) | ORAL | 1 refills | Status: DC | PRN

## 2016-09-16 MED ORDER — FUROSEMIDE 40 MG PO TABS: 40.0000 mg | ORAL_TABLET | Freq: Two times a day (BID) | ORAL | 1 refills | Status: DC

## 2016-09-16 MED ORDER — LEVOFLOXACIN 750 MG PO TABS: 750.0000 mg | ORAL_TABLET | Freq: Every day | ORAL | 0 refills | Status: DC

## 2016-09-16 MED ORDER — FUROSEMIDE 80 MG PO TABS
80.0000 mg | ORAL_TABLET | Freq: Two times a day (BID) | ORAL | Status: DC
  Administered 2016-09-16 – 2016-09-17 (×2): 80 mg via ORAL
  Filled 2016-09-16 (×2): qty 1

## 2016-09-16 MED ORDER — SODIUM CHLORIDE 0.9 % IV SOLN
INTRAVENOUS | Status: DC
  Administered 2016-09-16: 20 mL/h via INTRAVENOUS

## 2016-09-16 MED ORDER — INSULIN GLARGINE 100 UNIT/ML ~~LOC~~ SOLN: 27.0000 [IU] | Freq: Every day | SUBCUTANEOUS | 11 refills | Status: DC

## 2016-09-16 NOTE — Progress Notes (Signed)
Initial Nutrition Assessment  DOCUMENTATION CODES:   Obesity unspecified  INTERVENTION:   Glucerna Shake po TID, each supplement provides 220 kcal and 10 grams of protein  NUTRITION DIAGNOSIS:   Inadequate oral intake related to lethargy/confusion as evidenced by meal completion < 50%.  GOAL:   Patient will meet greater than or equal to 90% of their needs  MONITOR:   PO intake, Supplement acceptance, I & O's, Skin    ASSESSMENT:   75 y.o. female with history of PAD, DM, HTN, prior CVA with residual right sided weakness admitted with sepsis due to pneumonia and bacteremia. She was fluid resuscitated on admission due to sepsis,  required IV diuresis due to Pulmonary edema and acute on chronic CHF   Met with pt in room today. Pt currently NPO for scheduled TEE today. Pt reports that she is currently eating about 50% of her meals in hospital and drinking at least one, but sometimes 2 Glucerna per day. Per chart, pt has had many weight fluctuations since admit likely r/t fluid changes but it appears that pt's weight is stable. Pt with multiple wounds. RD encouraged protein intake for wound healing. Pt reports that she feels sick on her stomach today s/p her physical therapy session. Pt with previous constipation but had large BM today.   Medications reviewed and include: plavix, lovenox, lasix, insulin, MVI, miralax  Labs reviewed: Cl 99(L), Alb 2.6(L)-2/11 Wbc- 12(H) cbgs- 169, 180 x 24 hrs AIC 8.4(H) 07/2016  Diet Order:  Diet NPO time specified  Skin:  Wound (see comment) (diabetic ulcer to R foot; DTI heel)  Last BM:  2/12 (constipation )  Height:   Ht Readings from Last 1 Encounters:  09/10/16 5' 4" (1.626 m)    Weight:   Wt Readings from Last 1 Encounters:  09/16/16 208 lb (94.3 kg)    Ideal Body Weight:  54.5 kg  BMI:  Body mass index is 35.7 kg/m.  Estimated Nutritional Needs:   Kcal:  1600-1800  Protein:  >/= 100 gm  Fluid:  2 L  EDUCATION NEEDS:    No education needs identified at this time   , RD, LDN Pager #- [phone]  

## 2016-09-16 NOTE — Clinical Social Work Note (Signed)
CSW spoke to [place] @ [employer] [id number] to get new auth number for pt.   New Auth# [id number] for [facility] for 3 days. VC 543 Therapy Hrs. Review again on 2/14.  Information provided to [place] @ facility. Ok to send pt once DC Summary is pulled from hub per [place].  Ayonna Speranza B. Joline Maxcy Clinical Social Work Dept Weekend Social Worker [phone] 1:36 PM

## 2016-09-16 NOTE — Progress Notes (Signed)
At patient bedside to assess for PICC for venous access.  Right arm restricted and unable to use.  Left arm assessed with ultrasound and found that all forearm veins are inappropriate due to depth and size of vein.  Patient is unable to have arm positioned to side to place PICC or extended length peripheral IV.  22g PIV placed in left hand for access.  If patient requires central access, she is not a candidate for bedside placement and will need IR placement.  Dawn, RN updated.  Carolee Rota, RN VAST

## 2016-09-16 NOTE — Progress Notes (Signed)
Physical Therapy Treatment Patient Details Name: [name] MRN: [id number] DOB: Oct 09, 1940 Today's Date: 09/16/2016    History of Present Illness 76 YO F admitted with encephalopathy. PMH includes HTN, DM, diastolic acute CHF, PAD, RLE BPG, CVA, aortic stenosis, neuropathy, edema.     PT Comments    Pt able to perform a few supine LE exercises today.  Pt attempted to sit EOB however unable to achieve full upright posture at EOB.  Pt requiring increased assist for mobility at this time.  Continue to recommend SNF upon d/c.   Follow Up Recommendations  SNF     Equipment Recommendations  None recommended by PT    Recommendations for Other Services       Precautions / Restrictions Precautions Precautions: Fall    Mobility  Bed Mobility Overal bed mobility: Needs Assistance Bed Mobility: Supine to Sit;Sit to Supine     Supine to sit: Max assist;+2 for physical assistance Sit to supine: Max assist;+2 for physical assistance   General bed mobility comments: multimodal cues throughout for technique and participation, pt able to slowly move LEs however then states she requires assist for completing task, attempted to R side of bed and pt felt unable to complete full sitting, then attempted to L side of bed and pt able to initiate assist better however continues to require increased assist for mobility; pt briefly achieved semilong sitting position however declined further mobility thereafter  Transfers                    Ambulation/Gait                 Stairs            Wheelchair Mobility    Modified Rankin (Stroke Patients Only)       Balance                                    Cognition Arousal/Alertness: Awake/alert Behavior During Therapy: Flat affect Overall Cognitive Status: Impaired/Different from baseline Area of Impairment: Attention;Memory;Following commands;Safety/judgement;Awareness;Problem solving Orientation  Level: Situation Current Attention Level: Selective Memory: Decreased short-term memory Following Commands: Follows one step commands inconsistently;Follows one step commands with increased time Safety/Judgement: Decreased awareness of deficits;Decreased awareness of safety Awareness: Intellectual Problem Solving: Slow processing;Difficulty sequencing;Requires verbal cues;Requires tactile cues General Comments: pt does not recall working with PT previously    Exercises General Exercises - Lower Extremity Ankle Circles/Pumps: AROM;Both;10 reps Quad Sets: AROM;Both;10 reps Heel Slides: AAROM;10 reps;Both;Supine Hip ABduction/ADduction: AROM;10 reps;Both;Supine    General Comments        Pertinent Vitals/Pain Pain Assessment: No/denies pain Pain Intervention(s): Repositioned    Home Living                      Prior Function            PT Goals (current goals can now be found in the care plan section) Progress towards PT goals: Progressing toward goals    Frequency    Min 2X/week      PT Plan Current plan remains appropriate    Co-evaluation             End of Session   Activity Tolerance: Patient limited by fatigue Patient left: in bed;with call bell/phone within reach;with bed alarm set     Time: [id number] PT Time Calculation (min) (ACUTE ONLY): 25 min  Charges:  $Therapeutic Exercise: 8-22 mins $Therapeutic Activity: 8-22 mins                    G Codes:      Rabab Currington,KATHrine E 2016-09-25, 12:56 PM Carmelia Bake, PT, DPT 2016-09-25 Pager: [phone]

## 2016-09-16 NOTE — Care Management Important Message (Signed)
Important Message  Patient Details  Name: [name] MRN: [id number] Date of Birth: 03-15-1941   Medicare Important Message Given:  Yes    Orbie Pyo 09/16/2016, 4:21 PM

## 2016-09-16 NOTE — Discharge Summary (Addendum)
Physician Discharge Summary  [name] MRN: [id number] DOB/AGE: 06-Sep-1940 76 y.o.  PCP: [name], MD   Admit date: 08/28/2016 Discharge date: 09/16/2016  Discharge Diagnoses:    Principal Problem:   Acute encephalopathy Active Problems:   DM2 (diabetes mellitus, type 2) ([place])   Atherosclerosis of native arteries of the extremities with ulceration ([place])   Essential hypertension   Ulcer of right lower extremity (HCC)   Sepsis ([place])   Bilateral pneumonia   Bacteremia   Sepsis due to Streptococcus, group B ([place])   Multiple open wounds of lower leg   PVD (peripheral vascular disease) ([place])    Follow-up recommendations Follow-up with PCP in 3-5 days , including all  additional recommended appointments as below Follow-up CBC, CMP in 3-5 days   Addendum TEE cancelled,Per Dr. Harrington Challenger will reschedule patient under anesthesia care If TEE positive verify abx regimen with ID     Current Discharge Medication List    START taking these medications   Details  acetaminophen (TYLENOL) 325 MG tablet Take 2 tablets (650 mg total) by mouth every 6 (six) hours as needed for moderate pain or fever. Qty: 30 tablet, Refills: 1            CONTINUE these medications which have CHANGED   Details  furosemide (LASIX) 40 MG tablet Take 1 tablet (40 mg total) by mouth 2 (two) times daily. Qty: 30 tablet, Refills: 1    insulin glargine (LANTUS) 100 UNIT/ML injection Inject 0.27 mLs (27 Units total) into the skin at bedtime. Qty: 10 mL, Refills: 11      CONTINUE these medications which have NOT CHANGED   Details  albuterol (PROVENTIL HFA;VENTOLIN HFA) 108 (90 Base) MCG/ACT inhaler Inhale 2 puffs into the lungs every 6 (six) hours as needed.    amLODipine (NORVASC) 10 MG tablet Take 1 tablet (10 mg total) by mouth daily. Qty: 30 tablet, Refills: 3    aspirin 325 MG tablet Take 1 tablet (325 mg total) by mouth daily with breakfast. Qty: 30 tablet, Refills: 0    atorvastatin  (LIPITOR) 20 MG tablet Take 1 tablet (20 mg total) by mouth daily. Qty: 30 tablet, Refills: 11    carvedilol (COREG) 12.5 MG tablet Take 12.5 mg by mouth 2 (two) times daily with a meal.    cetirizine (ZYRTEC) 10 MG tablet Take 10 mg by mouth daily.     cholecalciferol (VITAMIN D) 1000 units tablet Take 1,000 Units by mouth daily.    citalopram (CELEXA) 20 MG tablet Take 20 mg by mouth daily.    clopidogrel (PLAVIX) 75 MG tablet Take 1 tablet (75 mg total) by mouth daily with breakfast. Qty: 30 tablet, Refills: 3    docusate sodium (COLACE) 100 MG capsule Take 100 mg by mouth 2 (two) times daily.    fluticasone (FLONASE) 50 MCG/ACT nasal spray Place 1 spray into both nostrils daily.    insulin aspart (NOVOLOG) 100 UNIT/ML injection Inject 0-15 Units into the skin 3 (three) times daily with meals. Before each meal 3 times a day, 140-199 - 3 units, 200-250 - 5 units, 251-299 - 8 units,  300-349 - 12 units,  350 or above 15 units. Qty: 10 mL, Refills: 11    isosorbide mononitrate (IMDUR) 60 MG 24 hr tablet Take 1 tablet (60 mg total) by mouth daily. Qty: 30 tablet, Refills: 0    nitroGLYCERIN (NITROSTAT) 0.4 MG SL tablet Place 1 tablet (0.4 mg total) under the tongue every 5 (five)  minutes as needed for chest pain. Qty: 3 tablet, Refills: 12    omeprazole (PRILOSEC) 20 MG capsule Take 20 mg by mouth daily.    polyethylene glycol (MIRALAX / GLYCOLAX) packet Take 17 g by mouth 2 (two) times daily. Qty: 14 each, Refills: 0    potassium chloride SA (K-DUR,KLOR-CON) 20 MEQ tablet Take 20 mEq by mouth daily.    pregabalin (LYRICA) 50 MG capsule Take 50 mg by mouth 3 (three) times daily.    ranolazine (RANEXA) 500 MG 12 hr tablet Take 1 tablet (500 mg total) by mouth 2 (two) times daily. Qty: 60 tablet, Refills: 0    VITAMIN E PO Take 1 capsule by mouth daily.    collagenase (SANTYL) ointment Apply topically 2 (two) times daily. Apply to dorsum right foot wound daily. Qty: 15 g,  Refills: 0      STOP taking these medications     losartan (COZAAR) 100 MG tablet      metoprolol (LOPRESSOR) 50 MG tablet      doxycycline (VIBRAMYCIN) 100 MG capsule          Discharge Condition: Stable   Discharge Instructions Get Medicines reviewed and adjusted: Please take all your medications with you for your next visit with your Primary MD  Please request your Primary MD to go over all hospital tests and procedure/radiological results at the follow up, please ask your Primary MD to get all Hospital records sent to his/her office.  If you experience worsening of your admission symptoms, develop shortness of breath, life threatening emergency, suicidal or homicidal thoughts you must seek medical attention immediately by calling 911 or calling your MD immediately if symptoms less severe.  You must read complete instructions/literature along with all the possible adverse reactions/side effects for all the Medicines you take and that have been prescribed to you. Take any new Medicines after you have completely understood and accpet all the possible adverse reactions/side effects.   Do not drive when taking Pain medications.   Do not take more than prescribed Pain, Sleep and Anxiety Medications  Special Instructions: If you have smoked or chewed Tobacco in the last 2 yrs please stop smoking, stop any regular Alcohol and or any Recreational drug use.  Wear Seat belts while driving.  Please note  You were cared for by a hospitalist during your hospital stay. Once you are discharged, your primary care physician will handle any further medical issues. Please note that NO REFILLS for any discharge medications will be authorized once you are discharged, as it is imperative that you return to your primary care physician (or establish a relationship with a primary care physician if you do not have one) for your aftercare needs so that they can reassess your need for medications and  monitor your lab values.     Allergies  Allergen Reactions  . Iodine Anaphylaxis  . Penicillins Anaphylaxis    Rec'd cefazolin 02/04/14 pre-op for femur ORIF  . Shellfish Allergy Anaphylaxis  . Shellfish-Derived Products Anaphylaxis  . Sulfa Antibiotics Anaphylaxis  . Sulfacetamide Sodium Anaphylaxis  . Sulfasalazine Anaphylaxis  . Atorvastatin     Other reaction(s): Unknown      Disposition: SNF   Consults:   Cardiology Infectious disease    Significant Diagnostic Studies:  Ct Head Wo Contrast  Result Date: 08/29/2016 CLINICAL DATA:  Initial evaluation for acute altered mental status. EXAM: CT HEAD WITHOUT CONTRAST TECHNIQUE: Contiguous axial images were obtained from the base of the skull through the  vertex without intravenous contrast. COMPARISON:  Prior MRI from 07/23/2016. FINDINGS: Brain: Generalized age-related cerebral atrophy with chronic microvascular ischemic disease, stable. No acute intracranial hemorrhage. No evidence for acute large vessel territory infarct. Small calcified meningioma at the right posterior fossa noted without significant mass effect. No other mass lesion. No midline shift or mass effect. No hydrocephalus. No extra-axial fluid collection. Vascular: No hyperdense vessel. Scattered vascular calcifications noted within the carotid siphons. Skull: Scalp soft tissues demonstrate no acute abnormality. Calvarium intact. Sinuses/Orbits: Globes and orbits within normal limits. Patient is status post lens extraction bilaterally. Paranasal sinuses are clear. Trace left mastoid effusion. IMPRESSION: 1. No acute intracranial process identified. 2. Small calcified meningioma within the right posterior fossa without significant mass effect, stable. 3. Mild atrophy with chronic small vessel ischemic disease. Electronically Signed   By: Jeannine Boga M.D.   On: 08/29/2016 05:30   Mr Foot Right W Wo Contrast  Result Date: 09/12/2016 CLINICAL DATA:  Bacteremia.  Multiple ulcers of the right lower leg, ankle and foot EXAM: MRI OF THE RIGHT FOREFOOT WITHOUT AND WITH CONTRAST; MRI OF LOWER RIGHT EXTREMITY WITHOUT AND WITH CONTRAST TECHNIQUE: Multiplanar, multisequence MR imaging of the right foot was performed before and after the administration of intravenous contrast. Multiplanar, multisequence MR imaging of the right lower leg was performed before and after the administration of intravenous contrast. CONTRAST:  78m MULTIHANCE GADOBENATE DIMEGLUMINE 529 MG/ML IV SOLN COMPARISON:  None. FINDINGS: TENDONS Peroneal: Peroneal longus tendon intact. Peroneal brevis intact. Posteromedial: Posterior tibial tendon intact. Flexor hallucis longus tendon intact. Flexor digitorum longus tendon intact. Anterior: Tibialis anterior tendon intact. Extensor hallucis longus tendon intact Extensor digitorum longus tendon intact. Achilles:  Intact. Plantar Fascia: Mild focal thickening of the medial band of the plantar fascia at the calcaneal insertion concerning for mild plantar fasciitis. LIGAMENTS Lateral: Anterior talofibular ligament intact. Calcaneofibular ligament intact. Posterior talofibular ligament intact. Anterior and posterior tibiofibular ligaments intact. Medial: Deltoid ligament intact. Spring ligament intact. Lisfranc: Intact. CARTILAGE Ankle Joint: No joint effusion. Normal ankle mortise. No chondral defect. Subtalar Joints/Sinus Tarsi: Normal subtalar joints. No subtalar joint effusion. Normal sinus tarsi. Bones: No acute fracture dislocation. Prior arthrodesis of the first MTP joint. No focal marrow signal abnormality. No periosteal reaction or bone destruction. Soft Tissue: Severe to atrophy diffusely throughout the plantar musculature. No focal fluid collection or hematoma. Generalized soft tissue edema throughout the right foot and right lower leg with enhancement concerning for cellulitis. Mild soft tissue edema of the left lower leg. IMPRESSION: 1. Generalized soft  tissue edema throughout the right foot and right lower leg with enhancement concerning for cellulitis. No drainable fluid collection to suggest an abscess. 2. No osteomyelitis of the right lower leg or right ankle and foot. 3. Mild focal thickening of the medial band of the plantar fascia at the calcaneal insertion concerning for mild plantar fasciitis. Electronically Signed   By: HKathreen Devoid  On: 09/12/2016 14:58   Mr Tibia Fibula Right W Wo Contrast  Result Date: 09/12/2016 CLINICAL DATA:  Bacteremia. Multiple ulcers of the right lower leg, ankle and foot EXAM: MRI OF THE RIGHT FOREFOOT WITHOUT AND WITH CONTRAST; MRI OF LOWER RIGHT EXTREMITY WITHOUT AND WITH CONTRAST TECHNIQUE: Multiplanar, multisequence MR imaging of the right foot was performed before and after the administration of intravenous contrast. Multiplanar, multisequence MR imaging of the right lower leg was performed before and after the administration of intravenous contrast. CONTRAST:  263mMULTIHANCE GADOBENATE DIMEGLUMINE 529 MG/ML IV SOLN  COMPARISON:  None. FINDINGS: TENDONS Peroneal: Peroneal longus tendon intact. Peroneal brevis intact. Posteromedial: Posterior tibial tendon intact. Flexor hallucis longus tendon intact. Flexor digitorum longus tendon intact. Anterior: Tibialis anterior tendon intact. Extensor hallucis longus tendon intact Extensor digitorum longus tendon intact. Achilles:  Intact. Plantar Fascia: Mild focal thickening of the medial band of the plantar fascia at the calcaneal insertion concerning for mild plantar fasciitis. LIGAMENTS Lateral: Anterior talofibular ligament intact. Calcaneofibular ligament intact. Posterior talofibular ligament intact. Anterior and posterior tibiofibular ligaments intact. Medial: Deltoid ligament intact. Spring ligament intact. Lisfranc: Intact. CARTILAGE Ankle Joint: No joint effusion. Normal ankle mortise. No chondral defect. Subtalar Joints/Sinus Tarsi: Normal subtalar joints. No subtalar  joint effusion. Normal sinus tarsi. Bones: No acute fracture dislocation. Prior arthrodesis of the first MTP joint. No focal marrow signal abnormality. No periosteal reaction or bone destruction. Soft Tissue: Severe to atrophy diffusely throughout the plantar musculature. No focal fluid collection or hematoma. Generalized soft tissue edema throughout the right foot and right lower leg with enhancement concerning for cellulitis. Mild soft tissue edema of the left lower leg. IMPRESSION: 1. Generalized soft tissue edema throughout the right foot and right lower leg with enhancement concerning for cellulitis. No drainable fluid collection to suggest an abscess. 2. No osteomyelitis of the right lower leg or right ankle and foot. 3. Mild focal thickening of the medial band of the plantar fascia at the calcaneal insertion concerning for mild plantar fasciitis. Electronically Signed   By: Kathreen Devoid   On: 09/12/2016 14:58   Dg Chest Port 1 View  Result Date: 09/06/2016 CLINICAL DATA:  Pulmonary edema. EXAM: PORTABLE CHEST 1 VIEW COMPARISON:  08/31/2016 FINDINGS: The cardiac silhouette remains enlarged. Pulmonary vascular congestion and bilateral perihilar and basilar airspace opacities do not appear significantly changed. There are small bilateral pleural effusions, with fluid extending into the right minor fissure. No pneumothorax is seen. IMPRESSION: 1. Unchanged cardiomegaly and bilateral basilar predominant airspace disease compatible with edema. Superimposed infection not excluded in the lung bases. 2. Small pleural effusions. Electronically Signed   By: Logan Bores M.D.   On: 09/06/2016 07:40   Dg Chest Port 1 View  Result Date: 08/31/2016 CLINICAL DATA:  Dyspnea.  History of pneumonia. EXAM: PORTABLE CHEST 1 VIEW COMPARISON:  08/30/2016 FINDINGS: The cardiac silhouette is enlarged. Mediastinal contours appear intact. Stable appearance of bilateral perihilar and lower lobe predominant airspace opacities.  Mild increase of the interstitial markings. Probable bilateral pleural effusions. Osseous structures are without acute abnormality. Soft tissues are grossly normal. IMPRESSION: Cardiomegaly with interstitial pulmonary edema. Bilateral pleural effusions with lower lobe atelectasis versus airspace consolidation. Electronically Signed   By: Fidela Salisbury M.D.   On: 08/31/2016 16:14   Dg Chest Port 1 View  Result Date: 08/30/2016 CLINICAL DATA:  Dyspnea. EXAM: PORTABLE CHEST 1 VIEW COMPARISON:  08/29/2016 and 07/05/2016 FINDINGS: Lungs are somewhat hypoinflated with hazy perihilar and bibasilar opacification slightly worse. Findings may be due to edema versus infection. Cannot exclude a small amount of pleural fluid. Moderate stable cardiomegaly. Remainder the exam is unchanged. IMPRESSION: Slight worsening bilateral perihilar bibasilar opacification which may be due to edema versus infection. Possible small amount of pleural fluid. Stable cardiomegaly. Electronically Signed   By: Marin Olp M.D.   On: 08/30/2016 19:26   Dg Chest Port 1 View  Result Date: 08/29/2016 CLINICAL DATA:  Fever EXAM: PORTABLE CHEST 1 VIEW COMPARISON:  07/05/2016 FINDINGS: Heart is enlarged. There is aortic atherosclerosis. There is atelectasis bilaterally. Minimal superimposed airspace  opacities in the right upper and both lower lobes cannot exclude pneumonia. No acute osseous abnormality. IMPRESSION: Stable cardiomegaly. Atelectasis. Patchy right upper and bibasilar areas of superimposed airspace disease may reflect minimal pneumonia. Electronically Signed   By: Ashley Royalty M.D.   On: 08/29/2016 01:40   Dg Abd Portable 1v  Result Date: 09/10/2016 CLINICAL DATA:  Constipated and vomiting. EXAM: PORTABLE ABDOMEN - 1 VIEW COMPARISON:  None. FINDINGS: The bowel gas pattern is normal. No radio-opaque calculi or other significant radiographic abnormality are seen. IMPRESSION: Negative one view abdomen. Electronically Signed    By: San Morelle M.D.   On: 09/10/2016 10:09   Dg Abd Portable 1v  Result Date: 09/06/2016 CLINICAL DATA:  Constipation. EXAM: PORTABLE ABDOMEN - 1 VIEW COMPARISON:  03/05/2016, 02/11/2014.  CT 03/03/2016. FINDINGS: Right femoral catheter noted. Soft tissue structures are unremarkable. Calcification right upper quadrant again noted consist with a gallstone. 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 Barium is noted in the colon. Colonic diverticuli noted. Stool noted throughout the colon. Constipation cannot be excluded. No bowel distention or free air. Postsurgical changes left hip. Degenerative changes lumbar spine and both hips. IMPRESSION: 1. 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 2. Second calcific density noted in the right upper quadrant most consistent with a gallstone. 3. Air is noted throughout the colon. Colonic diverticuli are noted. Stool noted throughout the colon. Constipation cannot be excluded. 4.  Right femoral venous catheter noted. Electronically Signed   By: Marcello Moores  Register   On: 09/06/2016 10:50   Dg Swallowing Func-speech Pathology  Result Date: 08/30/2016 Objective Swallowing Evaluation: Type of Study: MBS-Modified Barium Swallow Study Patient Details Name: SHERLEEN PANGBORN MRN: [id number] Date of Birth: [birth year] Today's Date: 08/30/2016 Time: SLP Start Time (ACUTE ONLY): 1055-SLP Stop Time (ACUTE ONLY): 1112 SLP Time Calculation (min) (ACUTE ONLY): 17 min Past Medical History: Past Medical History: Diagnosis Date . Acute heart failure ([place])  . Aortic stenosis  . Complication of anesthesia   Hard to wake patient up after having anesthesia . Diabetes mellitus without complication ([place])  . Diabetic neuropathy (HCC)   Takes Lyrica . Edema of both legs   Takes Lasix . GERD (gastroesophageal reflux disease)  . High cholesterol  . HTN  (hypertension)  . Hypertension  . PAD (peripheral artery disease) ([place])  . Shortness of breath dyspnea  . Spasm of back muscles  . Stroke ([place])  . Wound, open   Right great toe Past Surgical History: Past Surgical History: Procedure Laterality Date . BYPASS GRAFT POPLITEAL TO TIBIAL Right 02/28/2016  Procedure: BYPASS GRAFT RIGHT BELOW KNEE POPLITEAL TO PERONEAL USING REVERSED RIGHT GREATER SAPHENOUS VEIN;  Surgeon: Conrad Spencer, MD;  Location: [facility];  Service: Vascular;  Laterality: Right; . CYST EXCISION    Abdomen . EYE SURGERY Bilateral   Cataract removal . INTRAMEDULLARY (IM) NAIL INTERTROCHANTERIC Left 02/04/2014  Procedure: INTRAMEDULLARY (IM) NAIL INTERTROCHANTRIC FEMORAL;  Surgeon: Mauri Pole, MD;  Location: [facility];  Service: Orthopedics;  Laterality: Left; . IR GENERIC HISTORICAL  03/01/2016  IR ANGIO INTRA EXTRACRAN SEL COM CAROTID INNOMINATE UNI R MOD SED 03/01/2016 [name], MD [facility] . IR GENERIC HISTORICAL  03/01/2016  IR ENDOVASC INTRACRANIAL INF OTHER THAN THROMBO ART INC DIAG ANGIO 03/01/2016 [name], MD [facility] . IR GENERIC HISTORICAL  03/01/2016  IR INTRAVSC STENT CERV CAROTID W/O EMB-PROT  MOD SED INC ANGIO 03/01/2016 [name], MD [facility] . IR GENERIC HISTORICAL  06/03/2016  IR RADIOLOGIST EVAL & MGMT 06/03/2016 [facility] . ORIF TOE FRACTURE Right 02/08/2014  Procedure: OPEN REDUCTION INTERNAL FIXATION Right METATARSAL  FRACTURE ;  Surgeon: Wylene Simmer, MD;  Location: [facility];  Service: Orthopedics;  Laterality: Right; . PERIPHERAL VASCULAR CATHETERIZATION N/A 12/28/2015  Procedure: Abdominal Aortogram w/Lower Extremity;  Surgeon: Conrad [place], MD;  Location: [facility];  Service: Cardiovascular;  Laterality: N/A; . RADIOLOGY WITH ANESTHESIA N/A 03/01/2016  Procedure: RADIOLOGY WITH ANESTHESIA;  Surgeon: [name], MD;  Location: [facility];  Service: Radiology;  Laterality: N/A; . VEIN HARVEST Right 02/28/2016  Procedure: RIGHT GREATER SAPHENOUS  VEIN HARVEST;  Surgeon: Conrad [place], MD;  Location: [facility];  Service: Vascular;  Laterality: Right; HPI: Pt is a 76 y.o. female with PMH of DM2 and HTN, presented to ED 1/25 with AMS. Head CT negative, CXR showing patchy R upper/ bibasilar areas of superimposed airspace disease concerning for minimal PNA. Pt was followed by speech therapy in August 2017 following acute stroke; had MBS on 8/8 with recommendations for dysphagia 1/ honey thick liquids by tsp. Bedside swallow eval ordered. No Data Recorded Assessment / Plan / Recommendation CHL IP CLINICAL IMPRESSIONS 08/30/2016 Therapy Diagnosis WFL Clinical Impression Pt currently with oropharyngeal swallow function within functional limits. Pt does have premature spill to the level of the vallecula across consistencies but this did not result in any penetration or aspiration with numerous trials provided. Recommend initiating regular diet, thin liquids, meds whole with liquid, full supervision to assist with feeding during meals; also follow reflux precautions given hx of GERD. Worth noting that pt is concerned about having "hard foods" after not eating for a couple of days and may have preference for soft solids initially.SLP will sign off at this time; please re-consult if needs arise.  Impact on safety and function Mild aspiration risk   CHL IP TREATMENT RECOMMENDATION 08/30/2016 Treatment Recommendations No treatment recommended at this time           CHL IP DIET RECOMMENDATION 08/30/2016 SLP Diet Recommendations Regular solids;Thin liquid Liquid Administration via Cup;Straw Medication Administration Whole meds with liquid Compensations Slow rate;Small sips/bites Postural Changes Seated upright at 90 degrees;Remain semi-upright after after feeds/meals (Comment)   CHL IP OTHER RECOMMENDATIONS 08/30/2016 Recommended Consults -- Oral Care Recommendations Oral care BID Other Recommendations --   CHL IP FOLLOW UP RECOMMENDATIONS 08/30/2016 Follow up Recommendations None         CHL IP ORAL PHASE 08/30/2016 Oral Phase Impaired Oral - Pudding Teaspoon -- Oral - Pudding Cup -- Oral - Honey Teaspoon -- Oral - Honey Cup -- Oral - Nectar Teaspoon -- Oral - Nectar Cup -- Oral - Nectar Straw -- Oral - Thin Teaspoon -- Oral - Thin Cup Premature spillage Oral - Thin Straw Premature spillage Oral - Puree Premature spillage Oral - Mech Soft -- Oral - Regular Premature spillage Oral - Multi-Consistency -- Oral - Pill -- Oral Phase - Comment --  CHL IP PHARYNGEAL PHASE 08/30/2016 Pharyngeal Phase WFL Pharyngeal- Pudding Teaspoon -- Pharyngeal -- Pharyngeal- Pudding Cup -- Pharyngeal -- Pharyngeal- Honey Teaspoon -- Pharyngeal -- Pharyngeal- Honey Cup -- Pharyngeal -- Pharyngeal- Nectar Teaspoon -- Pharyngeal -- Pharyngeal- Nectar Cup -- Pharyngeal -- Pharyngeal- Nectar Straw -- Pharyngeal -- Pharyngeal- Thin Teaspoon -- Pharyngeal -- Pharyngeal- Thin Cup -- Pharyngeal -- Pharyngeal- Thin Straw -- Pharyngeal -- Pharyngeal- Puree -- Pharyngeal -- Pharyngeal- Mechanical  Soft -- Pharyngeal -- Pharyngeal- Regular -- Pharyngeal -- Pharyngeal- Multi-consistency -- Pharyngeal -- Pharyngeal- Pill -- Pharyngeal -- Pharyngeal Comment --  CHL IP CERVICAL ESOPHAGEAL PHASE 08/30/2016 Cervical Esophageal Phase WFL Pudding Teaspoon -- Pudding Cup -- Honey Teaspoon -- Honey Cup -- Nectar Teaspoon -- Nectar Cup -- Nectar Straw -- Thin Teaspoon -- Thin Cup -- Thin Straw -- Puree -- Mechanical Soft -- Regular -- Multi-consistency -- Pill -- Cervical Esophageal Comment -- No flowsheet data found. Kern Reap, MA, CCC-SLP 08/30/2016, 11:18 AM [phone]             2-D echo LV EF: 60% -   65%  ------------------------------------------------------------------- Indications:      CHF - 428.0.  ------------------------------------------------------------------- History:   PMH:  Aortic stenosis.  Dyspnea.  Stroke.  Risk factors:  Hypertension. Diabetes mellitus.  Dyslipidemia.  ------------------------------------------------------------------- Study Conclusions  - Procedure narrative: Transthoracic echocardiography. Image   quality was adequate. The study was technically difficult. - Left ventricle: The cavity size was normal. Wall thickness was   increased in a pattern of moderate LVH. Systolic function was   normal. The estimated ejection fraction was in the range of 60%   to 65%. Wall motion was normal; there were no regional wall   motion abnormalities. Doppler parameters are consistent with   abnormal left ventricular relaxation (grade 1 diastolic   dysfunction). - Aortic valve: Probably trileaflet; severely calcified leaflets.   There was mild to moderate stenosis. Mean gradient (S): 13 mm Hg.   [facility] gradient (S): 22 mm Hg. Valve area (VTI): 1.3 cm^2. - Mitral valve: Moderately calcified annulus. Mildly calcified   leaflets . There was no significant regurgitation. - Right ventricle: The cavity size was normal. Systolic function   was normal. - Pulmonary arteries: No complete TR doppler jet so unable to   estimate PA systolic pressure. - Systemic veins: IVC measured 1.7 cm with < 50% respirophasic   variation, suggesting RA pressure 8 mmHg.  Impressions:  - Normal LV size with moderate LV hypertrophy. EF 60-65%. Normal RV   size and systolic function. Mild to moderate aortic stenosis   (moderate visually and by calculated valve area, mild by mean   gradient).   TEE     Filed Weights   09/13/16 0500 09/14/16 0558 09/16/16 0459  Weight: 96.4 kg (212 lb 9.6 oz) 97.1 kg (214 lb) 94.3 kg (208 lb)     Microbiology: No results found for this or any previous visit (from the past 240 hour(s)).     Blood Culture    Component Value Date/Time   SDES BLOOD LEFT ARM 08/30/2016 1348   SDES BLOOD LEFT ARM 08/30/2016 1348   SPECREQUEST IN PEDIATRIC BOTTLE .5CC 08/30/2016 1348   SPECREQUEST IN PEDIATRIC BOTTLE 1CC 08/30/2016  1348   CULT NO GROWTH 5 DAYS 08/30/2016 1348   CULT NO GROWTH 5 DAYS 08/30/2016 1348   REPTSTATUS 09/04/2016 FINAL 08/30/2016 1348   REPTSTATUS 09/04/2016 FINAL 08/30/2016 1348      Labs: Results for orders placed or performed during the hospital encounter of 08/28/16 (from the past 48 hour(s))  Glucose, capillary     Status: Abnormal   Collection Time: 09/14/16 11:53 AM  Result Value Ref Range   Glucose-Capillary 150 (H) 65 - 99 mg/dL  Glucose, capillary     Status: Abnormal   Collection Time: 09/14/16  4:02 PM  Result Value Ref Range   Glucose-Capillary 171 (H) 65 - 99 mg/dL  Glucose, capillary  Status: Abnormal   Collection Time: 09/14/16  8:21 PM  Result Value Ref Range   Glucose-Capillary 185 (H) 65 - 99 mg/dL  Glucose, capillary     Status: Abnormal   Collection Time: 09/15/16 12:32 AM  Result Value Ref Range   Glucose-Capillary 206 (H) 65 - 99 mg/dL  CBC     Status: Abnormal   Collection Time: 09/15/16  4:43 AM  Result Value Ref Range   WBC 12.0 (H) 4.0 - 10.5 K/uL    Comment: WHITE COUNT CONFIRMED ON SMEAR   RBC 4.31 3.87 - 5.11 MIL/uL   Hemoglobin 11.0 (L) 12.0 - 15.0 g/dL   HCT 33.8 (L) 36.0 - 46.0 %   MCV 78.4 78.0 - 100.0 fL   MCH 25.5 (L) 26.0 - 34.0 pg   MCHC 32.5 30.0 - 36.0 g/dL   RDW 16.3 (H) 11.5 - 15.5 %   Platelets 352 150 - 400 K/uL    Comment: PLATELET COUNT CONFIRMED BY SMEAR  Comprehensive metabolic panel     Status: Abnormal   Collection Time: 09/15/16  4:43 AM  Result Value Ref Range   Sodium 133 (L) 135 - 145 mmol/L   Potassium 6.0 (H) 3.5 - 5.1 mmol/L   Chloride 95 (L) 101 - 111 mmol/L   CO2 26 22 - 32 mmol/L   Glucose, Bld 169 (H) 65 - 99 mg/dL   BUN 11 6 - 20 mg/dL   Creatinine, Ser 0.98 0.44 - 1.00 mg/dL   Calcium 9.6 8.9 - 10.3 mg/dL   Total Protein 5.9 (L) 6.5 - 8.1 g/dL   Albumin 2.6 (L) 3.5 - 5.0 g/dL   AST 23 15 - 41 U/L   ALT 11 (L) 14 - 54 U/L   Alkaline Phosphatase 60 38 - 126 U/L   Total Bilirubin 0.6 0.3 - 1.2 mg/dL    GFR calc non Af Amer 56 (L) >60 mL/min   GFR calc Af Amer >60 >60 mL/min    Comment: (NOTE) The eGFR has been calculated using the CKD EPI equation. This calculation has not been validated in all clinical situations. eGFR's persistently <60 mL/min signify possible Chronic Kidney Disease.    Anion gap 12 5 - 15  Glucose, capillary     Status: Abnormal   Collection Time: 09/15/16  4:56 AM  Result Value Ref Range   Glucose-Capillary 175 (H) 65 - 99 mg/dL  Glucose, capillary     Status: Abnormal   Collection Time: 09/15/16  8:32 AM  Result Value Ref Range   Glucose-Capillary 138 (H) 65 - 99 mg/dL  Glucose, capillary     Status: Abnormal   Collection Time: 09/15/16 11:45 AM  Result Value Ref Range   Glucose-Capillary 148 (H) 65 - 99 mg/dL   Comment 1 Document in Chart   Glucose, capillary     Status: Abnormal   Collection Time: 09/15/16  8:05 PM  Result Value Ref Range   Glucose-Capillary 259 (H) 65 - 99 mg/dL  Glucose, capillary     Status: Abnormal   Collection Time: 09/16/16 12:04 AM  Result Value Ref Range   Glucose-Capillary 210 (H) 65 - 99 mg/dL  Glucose, capillary     Status: Abnormal   Collection Time: 09/16/16  4:56 AM  Result Value Ref Range   Glucose-Capillary 172 (H) 65 - 99 mg/dL  Basic metabolic panel     Status: Abnormal   Collection Time: 09/16/16  5:06 AM  Result Value Ref Range   Sodium 135  135 - 145 mmol/L   Potassium 4.5 3.5 - 5.1 mmol/L    Comment: DELTA CHECK NOTED HEMOLYSIS AT THIS LEVEL MAY AFFECT RESULT    Chloride 99 (L) 101 - 111 mmol/L   CO2 26 22 - 32 mmol/L   Glucose, Bld 180 (H) 65 - 99 mg/dL   BUN 12 6 - 20 mg/dL   Creatinine, Ser 0.88 0.44 - 1.00 mg/dL   Calcium 9.0 8.9 - 10.3 mg/dL   GFR calc non Af Amer >60 >60 mL/min   GFR calc Af Amer >60 >60 mL/min    Comment: (NOTE) The eGFR has been calculated using the CKD EPI equation. This calculation has not been validated in all clinical situations. eGFR's persistently <60 mL/min signify  possible Chronic Kidney Disease.    Anion gap 10 5 - 15  Protime-INR     Status: None   Collection Time: 09/16/16  5:06 AM  Result Value Ref Range   Prothrombin Time 13.9 11.4 - 15.2 seconds   INR 1.07   Glucose, capillary     Status: Abnormal   Collection Time: 09/16/16  7:18 AM  Result Value Ref Range   Glucose-Capillary 158 (H) 65 - 99 mg/dL     Lipid Panel     Component Value Date/Time   CHOL 109 03/02/2016 0600   TRIG 71 03/06/2016 0500   HDL 32 (L) 03/02/2016 0600   CHOLHDL 3.4 03/02/2016 0600   VLDL 24 03/02/2016 0600   LDLCALC 53 03/02/2016 0600     Lab Results  Component Value Date   HGBA1C 8.4 (H) 07/06/2016   HGBA1C 7.7 (H) 03/02/2016   HGBA1C 8.1 (H) 02/29/2016       HPI :   [name] is an 76 y.o. 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 ,. She was febrile to T102F and treated initially with vancomycin and aztreonam given her penicillin allergy, which she describes as "swelling all over," but was transitioned to levofloxacin monotherapy with pan-sensitive Group B Strep on blood cultures. TTE 1/29 did not comment on any vegetations though noted severe calcifications of her aortic trileaflets. Repeat blood cultures have been without growth.   She has chronic right lower extremity wounds, and ABIs 12/28/15 noted severe tibial disease bilaterally for which she underwent bypass grafting on 02/28/16 and then developed subsequent ischemic infarcts of the left basal ganglia and left internal capsule as noted on brain MRI 03/02/16. At baseline, she is mostly sedentary at home with her husband.She was fluid resuscitated on admission due to sepsis,  required IV diuresis due to Pulmonary edema and acute on chronic [facility]  HOSPITAL COURSE:   Sepsis due  to Group B strep bacteremia - Blood cultures were positive for group B strep 1/25, initiated on Levaquin   based on antibiogram.  . 2-D echo 1/29 did not show any evidence of endocarditis. ID consulted for further recommendations and for antibiotic management - Surveillance cultures obtained 1/26, no growth to date - Patient remains on PO levaquin since  1/25, as per infectious disease group B streptococcal bacteremia probably originated from one of her open wounds.  ID concerned about deep infection of the bones in the right ankle and foot and  MRI no evidence of deep infection such as an abscess or septic joint or osteomyelitis Infectious disease recommends TEE to rule  out endocarditis, which was completed on 2/12    Acute hypoxic respiratory failure due to HCAP and Pulmonary edema - Likely due to fluid resuscitation in the setting of sepsis. Chest x-ray showed slight pulmonary vascular congestion versus worsening of her pneumonia.  Breathing is improving, 97%on RA  Changed Lasix to by mouth 2/7,foley dc'd  Documented bodyweight likely inaccurate as the patient has not lost any weight since 1/25 but the swelling has definitely improved Continue to monitor renal function/potassium     Acute on chronic diastolic CHF - Most recent 2-D echo was done roughly 6 months ago on July 2017 and showed an ejection fraction of 55-60% as well as grade 2 diastolic dysfunction.  - Patient now fluid overload in the setting of fluid resuscitation due to sepsis - repeat 2d echo showed normal EF, and grade 1 diastolic dysfunction - For now, will continue by mouth Lasix 40 mg twice a day   Hypokalemia, subsequently developed hyperkalemia-  potassium was 6.0, 2/11. Patient is to receive Kayexalate and IV Lasix 1 Potassium now 4.5 prior to discharge   HCAP - secondary tostrep pneumonia Completed antibiotic therapy  Acute encephalopathy - Appears to be slowly resolving, suspect secondary to #1  and #2  - Alert, conversant, appears to interact appropriately - Stable  Hypertension - Continued on Coreg, Norvasc - Remains stable at present  Hyperlipidemia - Will continue statin as tolerated - Currently stable  Type 2 diabetes mellitus - For now, will continue lantus and sliding scale, improving - glucose remains in the mid-100's    Right lower extremity ulcer - wound care was consulted, ID consulted - remains stable there is no evidence of deep infection such as an abscess or septic joint or osteomyelitis  Peripheral artery disease  - Patient is continued on Plavix  - Currently remains stable  Constipation Continue MiraLAX, patient had a large bowel movement yesterday Trial of Dulcolax suppository      Discharge Exam: *  Blood pressure (!) 137/46, pulse 88, temperature 98.2 F (36.8 C), temperature source Oral, resp. rate 17, height '5\' 4"'  (1.626 m), weight 94.3 kg (208 lb), SpO2 94 %.   General exam: in nad, awake, conversant Respiratory system: normal resp effort, no audible wheezing Cardiovascular system: regular rhythm, s1-s2 Gastrointestinal system: mildly distended, pos BS, nondistended Central nervous system: no seizures, no tremors Extremities: perfused, no clubbing Skin: no rashes//no pallor Psychiatry: affect normal// no auditory hallucinations.    Contact information for after-discharge care    [facility] .   Specialty:  [place] Contact information: [place] [phone]              Signed: Reyne Dumas 09/16/2016, 10:29 AM        Time spent >45 mins

## 2016-09-16 NOTE — Clinical Social Work Note (Signed)
Clinical Social Worker continuing to follow patient and family for support and discharge planning needs.  Pt not medically cleared to DC today.  CSW faxed updated clinicals to [facility].  CSW will continue to follow.  Ajiah Mcglinn B. Joline Maxcy Clinical Social Work Dept Weekend Social Worker [phone] 1:14 PM

## 2016-09-16 NOTE — Progress Notes (Signed)
Patient arrived to endoscopy unit for TEE today. No IV access. Patient with very limited options for IV access due to limb restriction and vascular condiition. 1 unsuccessful attempt made for peripheral IV. Discussed with Dr. Harrington Challenger. Per Dr. Harrington Challenger will reschedule patient for next available with monitored anesthesia care. Patient advised of this, she expresses understanding and agrees. Primary RN notified.

## 2016-09-16 NOTE — Interval H&P Note (Signed)
History and Physical Interval Note:  09/16/2016 8:47 AM  [name]  has presented today for surgery, with the diagnosis of BACTEREMIA  The various methods of treatment have been discussed with the patient and family. After consideration of risks, benefits and other options for treatment, the patient has consented to  Procedure(s): TRANSESOPHAGEAL ECHOCARDIOGRAM (TEE) (N/A) as a surgical intervention .  The patient's history has been reviewed, patient examined, no change in status, stable for surgery.  I have reviewed the patient's chart and labs.  Questions were answered to the patient's satisfaction.     Dorris Carnes

## 2016-09-16 NOTE — H&P (View-Only) (Signed)
PROGRESS NOTE    [name]  [id number] DOB: March 30, 1941 DOA: 08/28/2016 PCP: [name], MD    Brief Narrative:  Briefly, complex vascular patient with history of PAD, DM, HTN, prior CVA with residual right sided weakness admitted with sepsis due to pneumonia and bacteremia. She was fluid resuscitated on admission due to sepsis,  required IV diuresis due to Pulmonary edema and acute on chronic dCHF  Assessment & Plan:   Principal Problem:   Acute encephalopathy Active Problems:   DM2 (diabetes mellitus, type 2) (HCC)   Atherosclerosis of native arteries of the extremities with ulceration (HCC)   Essential hypertension   Ulcer of right lower extremity (HCC)   Sepsis ([place])   Bilateral pneumonia   Bacteremia   Sepsis due to Streptococcus, group B ([place])   Multiple open wounds of lower leg   PVD (peripheral vascular disease) ([place])   Sepsis due to Group B strep bacteremia - Blood cultures were positive for group B strep, continue Levaquin alone as is sensitive based on antibiogram.  Started on levofloxacin 1/25. 2-D echo 1/29 did not show any evidence of endocarditis. ID consulted for further recommendations and for antibiotic management - Surveillance cultures obtained 1/26, no growth to date - Patient remains on PO levaquin since  1/25, as per infectious disease group B streptococcal bacteremia probably originated from one of her open wounds.  ID concerned about deep infection of the bones in the right ankle and foot and  MRI no evidence of deep infection such as an abscess or septic joint or osteomyelitis Infectious disease recommends TEE to rule out endocarditis scheduled TEE for bacteremia on 09/16/16  Acute hypoxic respiratory failure due to HCAP and Pulmonary edema - Likely due to fluid resuscitation in the setting of sepsis. Chest x-ray showed slight pulmonary vascular congestion versus worsening of her pneumonia.  Breathing is improving, 97%on RA  Changed Lasix to by  mouth 2/7,foley dc'd  Documented bodyweight likely inaccurate as the patient has not lost any weight since 1/25 but the swelling has definitely improved Continue to monitor renal function/potassium     Acute on chronic diastolic CHF - Most recent 2-D echo was done roughly 6 months ago on July 2017 and showed an ejection fraction of 55-60% as well as grade 2 diastolic dysfunction.  - Patient now fluid overload in the setting of fluid resuscitation due to sepsis - repeat 2d echo showed normal EF, and grade 1 diastolic dysfunction - For now, will continue by mouth Lasix   Hypokalemia-replete and check magnesium levels, potassium was 6.0. Patient is to receive Kayexalate and IV Lasix 1 Will recheck potassium in the morning.   HCAP - secondary to strep pneumonia - Will continue on oral abx as per above  Acute encephalopathy  - Appears to be slowly resolving, suspect secondary to #1 and #2  - Alert, conversant, appears to interact appropriately - Stable  Hypertension  - Continued on Coreg, Norvasc - Remains stable at present  Hyperlipidemia  - Will continue statin as tolerated - Currently stable  Type 2 diabetes mellitus  - For now, will continue lantus and sliding scale, improving - glucose remains in the mid-100's    Right lower extremity ulcer  - wound care was consulted, ID consulted - remains stable there is no evidence of deep infection such as an abscess or septic joint or osteomyelitis  Peripheral artery disease  - Patient is continued on Plavix  - Currently remains stable  Constipation Continue MiraLAX,  patient had a large bowel movement yesterday Trial of Dulcolax suppository       Status: Full Family Communication: discussed with husband  Disposition Plan:  Will need SNF, anticipate discharge Monday after TEE,    Consultants:   ID  Procedures:   2D echo Impressions: - Normal LV size with moderate LV hypertrophy. EF 60-65%. Normal RVsize  and systolic function. Mild to moderate aortic stenosis(moderate visually and by calculated valve area, mild by meangradient).  Antimicrobials: Anti-infectives    Start     Dose/Rate Route Frequency Ordered Stop   09/05/16 1000  levofloxacin (LEVAQUIN) tablet 750 mg     750 mg Oral Daily 09/04/16 1519     09/01/16 0800  vancomycin (VANCOCIN) IVPB 1000 mg/200 mL premix  Status:  Discontinued     1,000 mg 200 mL/hr over 60 Minutes Intravenous Every 12 hours 09/01/16 0731 09/02/16 1110   08/30/16 0800  levofloxacin (LEVAQUIN) IVPB 750 mg  Status:  Discontinued     750 mg 100 mL/hr over 90 Minutes Intravenous Every 24 hours 08/29/16 0545 09/04/16 1519   08/29/16 1800  vancomycin (VANCOCIN) IVPB 750 mg/150 ml premix  Status:  Discontinued     750 mg 150 mL/hr over 60 Minutes Intravenous Every 12 hours 08/29/16 0545 09/01/16 0731   08/29/16 0600  aztreonam (AZACTAM) 2 g in dextrose 5 % 50 mL IVPB  Status:  Discontinued     2 g 100 mL/hr over 30 Minutes Intravenous Every 8 hours 08/29/16 0402 08/29/16 1558   08/29/16 0415  levofloxacin (LEVAQUIN) IVPB 750 mg    Comments:  sepsis   750 mg 100 mL/hr over 90 Minutes Intravenous  Once 08/29/16 0402 08/29/16 0826   08/29/16 0400  aztreonam (AZACTAM) injection 1 g  Status:  Discontinued     1 g Intramuscular Every 8 hours 08/29/16 0312 08/29/16 0539   08/29/16 0330  vancomycin (VANCOCIN) 1,500 mg in sodium chloride 0.9 % 500 mL IVPB     1,500 mg 250 mL/hr over 120 Minutes Intravenous  Once 08/29/16 0304 08/29/16 1019   08/29/16 0315  levofloxacin (LEVAQUIN) tablet 750 mg  Status:  Discontinued     750 mg Oral  Once 08/29/16 0304 08/29/16 0402   08/29/16 0130  vancomycin (VANCOCIN) 1,500 mg in sodium chloride 0.9 % 500 mL IVPB  Status:  Discontinued     1,500 mg 250 mL/hr over 120 Minutes Intravenous  Once 08/29/16 0116 08/29/16 0304   08/29/16 0115  levofloxacin (LEVAQUIN) IVPB 750 mg  Status:  Discontinued     750 mg 100 mL/hr over 90  Minutes Intravenous  Once 08/29/16 0108 08/29/16 0304   08/29/16 0115  aztreonam (AZACTAM) 2 g in dextrose 5 % 50 mL IVPB  Status:  Discontinued     2 g 100 mL/hr over 30 Minutes Intravenous  Once 08/29/16 0108 08/29/16 0312   08/29/16 0115  vancomycin (VANCOCIN) IVPB 1000 mg/200 mL premix  Status:  Discontinued     1,000 mg 200 mL/hr over 60 Minutes Intravenous  Once 08/29/16 0108 08/29/16 0116      Subjective: Patient hemodynamically stable, no chest pain, shortness of breath, had a large bowel movement yesterday  Objective: Vitals:   09/14/16 1400 09/14/16 1500 09/14/16 2022 09/14/16 2024  BP:   (!) 102/34 (!) 117/44  Pulse: 78 82 78 76  Resp: 12 20 17 14   Temp:   97.7 F (36.5 C)   TempSrc:   Oral   SpO2: 99% 98% 94%  97%  Weight:      Height:       No intake or output data in the 24 hours ending 09/15/16 0816 Filed Weights   09/12/16 0439 09/13/16 0500 09/14/16 0558  Weight: 97 kg (213 lb 14.4 oz) 96.4 kg (212 lb 9.6 oz) 97.1 kg (214 lb)    Examination:  General exam: in nad, awake, conversant Respiratory system: normal resp effort, no audible wheezing Cardiovascular system: regular rhythm, s1-s2 Gastrointestinal system: mildly distended, pos BS, nondistended Central nervous system: no seizures, no tremors Extremities: perfused, no clubbing Skin: no rashes//no pallor Psychiatry: affect normal// no auditory hallucinations.   Data Reviewed: I have personally reviewed following labs and imaging studies  CBC:  Recent Labs Lab 09/12/16 1202 09/15/16 0443  WBC 9.2 12.0*  HGB 10.2* 11.0*  HCT 32.5* 33.8*  MCV 79.1 78.4  PLT 412* [id number]   Basic Metabolic Panel:  Recent Labs Lab 09/10/16 0845 09/11/16 0538 09/12/16 1202 09/13/16 0344 09/15/16 0443  NA 134* 132* 133* 133* 133*  K 4.2 3.0* 3.8 3.5 6.0*  CL 91* 87* 92* 93* 95*  CO2 29 34* 32 29 26  GLUCOSE 155* 158* 250* 150* 169*  BUN 10 9 7 7 11   CREATININE 0.88 0.85 0.79 0.79 0.98  CALCIUM 9.3 9.0 9.2  9.4 9.6  MG  --  2.2  --   --   --    GFR: Estimated Creatinine Clearance: 57.9 mL/min (by C-G formula based on SCr of 0.98 mg/dL). Liver Function Tests:  Recent Labs Lab 09/12/16 1202 09/15/16 0443  AST 18 23  ALT 13* 11*  ALKPHOS 56 60  BILITOT 0.9 0.6  PROT 5.7* 5.9*  ALBUMIN 2.7* 2.6*   No results for input(s): LIPASE, AMYLASE in the last 168 hours. No results for input(s): AMMONIA in the last 168 hours. Coagulation Profile: No results for input(s): INR, PROTIME in the last 168 hours. Cardiac Enzymes: No results for input(s): CKTOTAL, CKMB, CKMBINDEX, TROPONINI in the last 168 hours. BNP (last 3 results) No results for input(s): PROBNP in the last 8760 hours. HbA1C: No results for input(s): HGBA1C in the last 72 hours. CBG:  Recent Labs Lab 09/14/16 1153 09/14/16 1602 09/14/16 2021 09/15/16 0032 09/15/16 0456  GLUCAP 150* 171* 185* 206* 175*   Lipid Profile: No results for input(s): CHOL, HDL, LDLCALC, TRIG, CHOLHDL, LDLDIRECT in the last 72 hours. Thyroid Function Tests: No results for input(s): TSH, T4TOTAL, FREET4, T3FREE, THYROIDAB in the last 72 hours. Anemia Panel: No results for input(s): VITAMINB12, FOLATE, FERRITIN, TIBC, IRON, RETICCTPCT in the last 72 hours. Sepsis Labs: No results for input(s): PROCALCITON, LATICACIDVEN in the last 168 hours.  No results found for this or any previous visit (from the past 240 hour(s)).   Radiology Studies: No results found.  Scheduled Meds: . amLODipine  10 mg Oral Daily  . atorvastatin  20 mg Oral Daily  . carbamide peroxide  5 drop Both Ears BID  . carvedilol  12.5 mg Oral BID WC  . clopidogrel  75 mg Oral Q breakfast  . enoxaparin (LOVENOX) injection  40 mg Subcutaneous Daily  . feeding supplement (GLUCERNA SHAKE)  237 mL Oral TID BM  . furosemide  40 mg Oral BID  . insulin aspart  0-9 Units Subcutaneous Q4H  . insulin aspart  3 Units Subcutaneous TID WC  . insulin glargine  27 Units Subcutaneous QHS    . isosorbide mononitrate  60 mg Oral Daily  . levofloxacin  750 mg  Oral Daily  . multivitamin with minerals  1 tablet Oral Daily  . mupirocin cream   Topical Daily  . polyethylene glycol  17 g Oral BID  . ranolazine  500 mg Oral BID  . sodium chloride flush  10-20 mL Intravenous Q12H   Continuous Infusions:   LOS: 17 days   Reyne Dumas, MD [facility] Pager [phone]  If 7PM-7AM, please contact night-coverage [email or web address] Password [email or web address] 09/15/2016, 8:16 AM

## 2016-09-17 DIAGNOSIS — R7881 Bacteremia: Secondary | ICD-10-CM

## 2016-09-17 LAB — GLUCOSE, CAPILLARY
GLUCOSE-CAPILLARY: 153 mg/dL — AB (ref 65–99)
Glucose-Capillary: 149 mg/dL — ABNORMAL HIGH (ref 65–99)
Glucose-Capillary: 169 mg/dL — ABNORMAL HIGH (ref 65–99)
Glucose-Capillary: 204 mg/dL — ABNORMAL HIGH (ref 65–99)

## 2016-09-17 MED ORDER — LEVOFLOXACIN 750 MG PO TABS
750.0000 mg | ORAL_TABLET | Freq: Every day | ORAL | 0 refills | Status: DC
Start: 2016-09-17 — End: 2016-09-17

## 2016-09-17 MED ORDER — LEVOFLOXACIN 750 MG PO TABS: 750.0000 mg | ORAL_TABLET | Freq: Every day | ORAL | 0 refills | Status: AC

## 2016-09-17 NOTE — Clinical Social Work Note (Signed)
Clinical Social Worker facilitated patient discharge including contacting patient family and facility to confirm patient discharge plans.  Clinical information faxed to facility and family agreeable with plan.  CSW arranged ambulance transport via [facility] to [facility].  RN to call report prior to discharge.  Clinical Social Worker will sign off for now as social work intervention is no longer needed. Please consult [place] again if new need arises.  Barbette Or, [place]

## 2016-09-17 NOTE — Clinical Social Work Placement (Signed)
   CLINICAL SOCIAL WORK PLACEMENT  NOTE  Date:  09/17/2016  Patient Details  Name: [name] MRN: [id number] Date of Birth: 11-09-1940  Clinical Social Work is seeking post-discharge placement for this patient at the [place] level of care (*CSW will initial, date and re-position this form in  chart as items are completed):  Yes   Patient/family provided with [place] Work Department's list of facilities offering this level of care within the geographic area requested by the patient (or if unable, by the patient's family).  Yes   Patient/family informed of their freedom to choose among providers that offer the needed level of care, that participate in Medicare, Medicaid or managed care program needed by the patient, have an available bed and are willing to accept the patient.  Yes   Patient/family informed of [facility]'s ownership interest in [facility] and [facility], as well as of the fact that they are under no obligation to receive care at these facilities.  PASRR submitted to EDS on       PASRR number received on       Existing PASRR number confirmed on 09/16/16     FL2 transmitted to all facilities in geographic area requested by pt/family on 09/16/16     FL2 transmitted to all facilities within larger geographic area on       Patient informed that his/her managed care company has contracts with or will negotiate with certain facilities, including the following:        Yes   Patient/family informed of bed offers received.  Patient chooses bed at  [facility])     Physician recommends and patient chooses bed at      Patient to be transferred to  [employer]) on 09/17/16.  Patient to be transferred to facility by  [facility])     Patient family notified on 09/17/16 of transfer.  Name of family member notified:  Spouse     PHYSICIAN       Additional Comment:    Barbette Or, [place]

## 2016-09-17 NOTE — Progress Notes (Signed)
Subjective: No new complaints   Antibiotics:  Anti-infectives    Start     Dose/Rate Route Frequency Ordered Stop   09/17/16 0000  levofloxacin (LEVAQUIN) 750 MG tablet  Status:  Discontinued     750 mg Oral Daily 09/17/16 1100 09/17/16    09/17/16 0000  levofloxacin (LEVAQUIN) 750 MG tablet     750 mg Oral Daily 09/17/16 1101 09/24/16 2359   09/16/16 0000  levofloxacin (LEVAQUIN) 750 MG tablet  Status:  Discontinued     750 mg Oral Daily 09/16/16 1027 09/17/16    09/05/16 1000  levofloxacin (LEVAQUIN) tablet 750 mg     750 mg Oral Daily 09/04/16 1519     09/01/16 0800  vancomycin (VANCOCIN) IVPB 1000 mg/200 mL premix  Status:  Discontinued     1,000 mg 200 mL/hr over 60 Minutes Intravenous Every 12 hours 09/01/16 0731 09/02/16 1110   08/30/16 0800  levofloxacin (LEVAQUIN) IVPB 750 mg  Status:  Discontinued     750 mg 100 mL/hr over 90 Minutes Intravenous Every 24 hours 08/29/16 0545 09/04/16 1519   08/29/16 1800  vancomycin (VANCOCIN) IVPB 750 mg/150 ml premix  Status:  Discontinued     750 mg 150 mL/hr over 60 Minutes Intravenous Every 12 hours 08/29/16 0545 09/01/16 0731   08/29/16 0600  aztreonam (AZACTAM) 2 g in dextrose 5 % 50 mL IVPB  Status:  Discontinued     2 g 100 mL/hr over 30 Minutes Intravenous Every 8 hours 08/29/16 0402 08/29/16 1558   08/29/16 0415  levofloxacin (LEVAQUIN) IVPB 750 mg    Comments:  sepsis   750 mg 100 mL/hr over 90 Minutes Intravenous  Once 08/29/16 0402 08/29/16 0826   08/29/16 0400  aztreonam (AZACTAM) injection 1 g  Status:  Discontinued     1 g Intramuscular Every 8 hours 08/29/16 0312 08/29/16 0539   08/29/16 0330  vancomycin (VANCOCIN) 1,500 mg in sodium chloride 0.9 % 500 mL IVPB     1,500 mg 250 mL/hr over 120 Minutes Intravenous  Once 08/29/16 0304 08/29/16 1019   08/29/16 0315  levofloxacin (LEVAQUIN) tablet 750 mg  Status:  Discontinued     750 mg Oral  Once 08/29/16 0304 08/29/16 0402   08/29/16 0130  vancomycin  (VANCOCIN) 1,500 mg in sodium chloride 0.9 % 500 mL IVPB  Status:  Discontinued     1,500 mg 250 mL/hr over 120 Minutes Intravenous  Once 08/29/16 0116 08/29/16 0304   08/29/16 0115  levofloxacin (LEVAQUIN) IVPB 750 mg  Status:  Discontinued     750 mg 100 mL/hr over 90 Minutes Intravenous  Once 08/29/16 0108 08/29/16 0304   08/29/16 0115  aztreonam (AZACTAM) 2 g in dextrose 5 % 50 mL IVPB  Status:  Discontinued     2 g 100 mL/hr over 30 Minutes Intravenous  Once 08/29/16 0108 08/29/16 0312   08/29/16 0115  vancomycin (VANCOCIN) IVPB 1000 mg/200 mL premix  Status:  Discontinued     1,000 mg 200 mL/hr over 60 Minutes Intravenous  Once 08/29/16 0108 08/29/16 0116      Medications: Scheduled Meds: . amLODipine  10 mg Oral Daily  . atorvastatin  20 mg Oral Daily  . carbamide peroxide  5 drop Both Ears BID  . carvedilol  12.5 mg Oral BID WC  . clopidogrel  75 mg Oral Q breakfast  . enoxaparin (LOVENOX) injection  40 mg Subcutaneous Daily  . feeding supplement (Amesti)  237 mL Oral TID BM  . furosemide  80 mg Oral BID  . insulin aspart  0-9 Units Subcutaneous Q4H  . insulin aspart  3 Units Subcutaneous TID WC  . insulin glargine  27 Units Subcutaneous QHS  . isosorbide mononitrate  60 mg Oral Daily  . levofloxacin  750 mg Oral Daily  . multivitamin with minerals  1 tablet Oral Daily  . mupirocin cream   Topical Daily  . polyethylene glycol  17 g Oral BID  . ranolazine  500 mg Oral BID  . sodium chloride flush  10-20 mL Intravenous Q12H   Continuous Infusions: PRN Meds:.acetaminophen, acetaminophen, albuterol, hydrALAZINE, LORazepam, ondansetron (ZOFRAN) IV, polyethylene glycol, prochlorperazine, sodium chloride, sodium chloride flush, traMADol    Objective: Weight change: 2 lb 3.2 oz (0.998 kg)  Intake/Output Summary (Last 24 hours) at 09/17/16 1253 Last data filed at 09/17/16 0919  Gross per 24 hour  Intake               79 ml  Output                0 ml  Net                79 ml   Blood pressure (!) 140/49, pulse 70, temperature 98.6 F (37 C), temperature source Oral, resp. rate 14, height 5\' 4"  (1.626 m), weight 210 lb 3.2 oz (95.3 kg), SpO2 98 %. Temp:  [97.9 F (36.6 C)-98.6 F (37 C)] 98.6 F (37 C) (02/13 0427) Pulse Rate:  [70-84] 70 (02/13 0904) Resp:  [14-17] 14 (02/12 2122) BP: (140-158)/(47-79) 140/49 (02/13 1125) SpO2:  [98 %] 98 % (02/13 0427) Weight:  [210 lb 3.2 oz (95.3 kg)] 210 lb 3.2 oz (95.3 kg) (02/13 0427)  Physical Exam: General: Alert and awake, oriented x3, not in any acute distress. HEENT: anicteric sclera EOMI CVS regular rate, normal r,  no murmur rubs or gallops Chest: clear to auscultation bilaterally, no wheezing, rales or rhonchi Abdomen: soft nontender, nondistended, normal bowel sounds, Extremities: Wounds are dressed  Neuro: nonfocal  CBC:  CBC Latest Ref Rng & Units 09/15/2016 09/12/2016 09/05/2016  WBC 4.0 - 10.5 K/uL 12.0(H) 9.2 9.5  Hemoglobin 12.0 - 15.0 g/dL 11.0(L) 10.2(L) 8.7(L)  Hematocrit 36.0 - 46.0 % 33.8(L) 32.5(L) 27.4(L)  Platelets 150 - 400 K/uL 352 412(H) 260     BMET  Recent Labs  09/15/16 0443 09/16/16 0506  NA 133* 135  K 6.0* 4.5  CL 95* 99*  CO2 26 26  GLUCOSE 169* 180*  BUN 11 12  CREATININE 0.98 0.88  CALCIUM 9.6 9.0     Liver Panel   Recent Labs  09/15/16 0443  PROT 5.9*  ALBUMIN 2.6*  AST 23  ALT 11*  ALKPHOS 60  BILITOT 0.6       Sedimentation Rate No results for input(s): ESRSEDRATE in the last 72 hours. C-Reactive Protein No results for input(s): CRP in the last 72 hours.  Micro Results: Recent Results (from the past 720 hour(s))  Blood Culture (routine x 2)     Status: Abnormal   Collection Time: 08/29/16  1:27 AM  Result Value Ref Range Status   Specimen Description BLOOD LEFT ARM  Final   Special Requests BLOOD 5CC  Final   Culture  Setup Time   Final    GRAM POSITIVE COCCI IN CHAINS IN BOTH AEROBIC AND ANAEROBIC BOTTLES CRITICAL  RESULT CALLED TO, READ BACK BY AND VERIFIED WITH: J. Arminger  Pharm.D. 15:40 08/29/16  ([email or web address])    Culture GROUP B STREP(S.AGALACTIAE)ISOLATED (A)  Final   Report Status 08/31/2016 FINAL  Final   Organism ID, Bacteria GROUP B STREP(S.AGALACTIAE)ISOLATED  Final      Susceptibility   Group b strep(s.agalactiae)isolated - MIC*    CLINDAMYCIN >=1 RESISTANT Resistant     AMPICILLIN <=0.25 SENSITIVE Sensitive     ERYTHROMYCIN >=8 RESISTANT Resistant     VANCOMYCIN 0.5 SENSITIVE Sensitive     CEFTRIAXONE <=0.12 SENSITIVE Sensitive     LEVOFLOXACIN 1 SENSITIVE Sensitive     * GROUP B STREP(S.AGALACTIAE)ISOLATED  Blood Culture ID Panel (Reflexed)     Status: Abnormal   Collection Time: 08/29/16  1:27 AM  Result Value Ref Range Status   Enterococcus species NOT DETECTED NOT DETECTED Final   Listeria monocytogenes NOT DETECTED NOT DETECTED Final   Staphylococcus species NOT DETECTED NOT DETECTED Final   Staphylococcus aureus NOT DETECTED NOT DETECTED Final   Streptococcus species DETECTED (A) NOT DETECTED Final    Comment: CRITICAL RESULT CALLED TO, READ BACK BY AND VERIFIED WITH: J. Arminger Pharm.D. 15:40 08/29/16 ([email or web address])    Streptococcus agalactiae DETECTED (A) NOT DETECTED Final    Comment: CRITICAL RESULT CALLED TO, READ BACK BY AND VERIFIED WITH: J. Arminger Pharm.D. 15:40 08/29/16 ([email or web address])    Streptococcus pneumoniae NOT DETECTED NOT DETECTED Final   Streptococcus pyogenes NOT DETECTED NOT DETECTED Final   Acinetobacter baumannii NOT DETECTED NOT DETECTED Final   Enterobacteriaceae species NOT DETECTED NOT DETECTED Final   Enterobacter cloacae complex NOT DETECTED NOT DETECTED Final   Escherichia coli NOT DETECTED NOT DETECTED Final   Klebsiella oxytoca NOT DETECTED NOT DETECTED Final   Klebsiella pneumoniae NOT DETECTED NOT DETECTED Final   Proteus species NOT DETECTED NOT DETECTED Final   Serratia marcescens NOT DETECTED NOT DETECTED Final   Haemophilus influenzae NOT DETECTED  NOT DETECTED Final   Neisseria meningitidis NOT DETECTED NOT DETECTED Final   Pseudomonas aeruginosa NOT DETECTED NOT DETECTED Final   Candida albicans NOT DETECTED NOT DETECTED Final   Candida glabrata NOT DETECTED NOT DETECTED Final   Candida krusei NOT DETECTED NOT DETECTED Final   Candida parapsilosis NOT DETECTED NOT DETECTED Final   Candida tropicalis NOT DETECTED NOT DETECTED Final  Blood Culture (routine x 2)     Status: Abnormal   Collection Time: 08/29/16  3:18 AM  Result Value Ref Range Status   Specimen Description BLOOD LEFT ARM  Final   Special Requests IN PEDIATRIC BOTTLE 2ML  Final   Culture  Setup Time   Final    GRAM POSITIVE COCCI IN CHAINS IN PEDIATRIC BOTTLE CRITICAL VALUE NOTED.  VALUE IS CONSISTENT WITH PREVIOUSLY REPORTED AND CALLED VALUE.    Culture (A)  Final    GROUP B STREP(S.AGALACTIAE)ISOLATED SUSCEPTIBILITIES PERFORMED ON PREVIOUS CULTURE WITHIN THE LAST 5 DAYS.    Report Status 08/31/2016 FINAL  Final  Urine culture     Status: None   Collection Time: 08/29/16  6:15 PM  Result Value Ref Range Status   Specimen Description URINE, CATHETERIZED  Final   Special Requests NONE  Final   Culture NO GROWTH  Final   Report Status 08/30/2016 FINAL  Final  MRSA PCR Screening     Status: None   Collection Time: 08/29/16  8:06 PM  Result Value Ref Range Status   MRSA by PCR NEGATIVE NEGATIVE Final    Comment:        The GeneXpert MRSA Assay (FDA approved for  NASAL specimens only), is one component of a comprehensive MRSA colonization surveillance program. It is not intended to diagnose MRSA infection nor to guide or monitor treatment for MRSA infections.   Culture, blood (Routine X 2) w Reflex to ID Panel     Status: None   Collection Time: 08/30/16  1:48 PM  Result Value Ref Range Status   Specimen Description BLOOD LEFT ARM  Final   Special Requests IN PEDIATRIC BOTTLE .5CC  Final   Culture NO GROWTH 5 DAYS  Final   Report Status 09/04/2016  FINAL  Final  Culture, blood (Routine X 2) w Reflex to ID Panel     Status: None   Collection Time: 08/30/16  1:48 PM  Result Value Ref Range Status   Specimen Description BLOOD LEFT ARM  Final   Special Requests IN PEDIATRIC BOTTLE [place]  Final   Culture NO GROWTH 5 DAYS  Final   Report Status 09/04/2016 FINAL  Final    Studies/Results: No results found.    Assessment/Plan:  INTERVAL HISTORY: TEE could not be accomplished due to IV access problems   Principal Problem:   Acute encephalopathy Active Problems:   DM2 (diabetes mellitus, type 2) (HCC)   Atherosclerosis of native arteries of the extremities with ulceration (HCC)   Essential hypertension   Ulcer of right lower extremity (HCC)   Sepsis ([place])   Bilateral pneumonia   Bacteremia   Sepsis due to Streptococcus, group B ([place])   Multiple open wounds of lower leg   PVD (peripheral vascular disease) (HCC)    [name] is a 76 y.o. female with  76 y.o. 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 42 she does not have evidence of deep bone or soft tissue infection by MRI.  We were wanting to exclude endocarditis by obtaining a transesophageal echocardiogram but this is, complicated by the difficulty in obtaining access in this person with very difficult to access veins.  I have gone back through the record and can see that the patient has now had 21 days of levofloxacin which is very bioavailable. She cleared her bacteremia rapidly between the 25th and January 26.  While text book treatment of endocarditis would not be with levofloxacin but more typically with a beta-lactam and aminoglycoside for part of the therapy it would be an optional way to treat endocarditis due to group B strep.  The more think about this patient more think  that endocarditis is not as high on my differential and I think about the fact the patient is RE had 3 weeks of essentially of parenteral antibiotic with levofloxacin.  I would forego the transesophageal echocardiogram and sibling give her 7 more days of levofloxacin.  I will sign off for now please call with further questions.   LOS: 19 days   Alcide Evener 09/17/2016, 12:53 PM

## 2016-09-17 NOTE — Discharge Summary (Addendum)
Physician Discharge Summary  [name] MRN: [id number] DOB/AGE: [birth year] 76 y.o.  PCP: [name], MD   Admit date: 08/28/2016 Discharge date: 09/17/2016  Discharge Diagnoses:    Principal Problem:   Acute encephalopathy Active Problems:   DM2 (diabetes mellitus, type 2) ([place])   Atherosclerosis of native arteries of the extremities with ulceration ([place])   Essential hypertension   Ulcer of right lower extremity (HCC)   Sepsis ([place])   Bilateral pneumonia   Bacteremia   Sepsis due to Streptococcus, group B ([place])   Multiple open wounds of lower leg   PVD (peripheral vascular disease) ([place])    Follow-up recommendations Levaquin for 7 days on discharge per ID.   Current Discharge Medication List    START taking these medications   Details  acetaminophen (TYLENOL) 325 MG tablet Take 2 tablets (650 mg total) by mouth every 6 (six) hours as needed for moderate pain or fever. Qty: 30 tablet, Refills: 1            CONTINUE these medications which have CHANGED   Details  furosemide (LASIX) 40 MG tablet Take 1 tablet (40 mg total) by mouth 2 (two) times daily. Qty: 30 tablet, Refills: 1    insulin glargine (LANTUS) 100 UNIT/ML injection Inject 0.27 mLs (27 Units total) into the skin at bedtime. Qty: 10 mL, Refills: 11      CONTINUE these medications which have NOT CHANGED   Details  albuterol (PROVENTIL HFA;VENTOLIN HFA) 108 (90 Base) MCG/ACT inhaler Inhale 2 puffs into the lungs every 6 (six) hours as needed.    amLODipine (NORVASC) 10 MG tablet Take 1 tablet (10 mg total) by mouth daily. Qty: 30 tablet, Refills: 3    aspirin 325 MG tablet Take 1 tablet (325 mg total) by mouth daily with breakfast. Qty: 30 tablet, Refills: 0    atorvastatin (LIPITOR) 20 MG tablet Take 1 tablet (20 mg total) by mouth daily. Qty: 30 tablet, Refills: 11    carvedilol (COREG) 12.5 MG tablet Take 12.5 mg by mouth 2 (two) times daily with a meal.    cetirizine (ZYRTEC) 10 MG  tablet Take 10 mg by mouth daily.     cholecalciferol (VITAMIN D) 1000 units tablet Take 1,000 Units by mouth daily.    citalopram (CELEXA) 20 MG tablet Take 20 mg by mouth daily.    clopidogrel (PLAVIX) 75 MG tablet Take 1 tablet (75 mg total) by mouth daily with breakfast. Qty: 30 tablet, Refills: 3    docusate sodium (COLACE) 100 MG capsule Take 100 mg by mouth 2 (two) times daily.    fluticasone (FLONASE) 50 MCG/ACT nasal spray Place 1 spray into both nostrils daily.    insulin aspart (NOVOLOG) 100 UNIT/ML injection Inject 0-15 Units into the skin 3 (three) times daily with meals. Before each meal 3 times a day, 140-199 - 3 units, 200-250 - 5 units, 251-299 - 8 units,  300-349 - 12 units,  350 or above 15 units. Qty: 10 mL, Refills: 11    isosorbide mononitrate (IMDUR) 60 MG 24 hr tablet Take 1 tablet (60 mg total) by mouth daily. Qty: 30 tablet, Refills: 0    nitroGLYCERIN (NITROSTAT) 0.4 MG SL tablet Place 1 tablet (0.4 mg total) under the tongue every 5 (five) minutes as needed for chest pain. Qty: 3 tablet, Refills: 12    omeprazole (PRILOSEC) 20 MG capsule Take 20 mg by mouth daily.    polyethylene glycol (MIRALAX / GLYCOLAX) packet Take 17  g by mouth 2 (two) times daily. Qty: 14 each, Refills: 0    potassium chloride SA (K-DUR,KLOR-CON) 20 MEQ tablet Take 20 mEq by mouth daily.    pregabalin (LYRICA) 50 MG capsule Take 50 mg by mouth 3 (three) times daily.    ranolazine (RANEXA) 500 MG 12 hr tablet Take 1 tablet (500 mg total) by mouth 2 (two) times daily. Qty: 60 tablet, Refills: 0    VITAMIN E PO Take 1 capsule by mouth daily.    collagenase (SANTYL) ointment Apply topically 2 (two) times daily. Apply to dorsum right foot wound daily. Qty: 15 g, Refills: 0      STOP taking these medications     losartan (COZAAR) 100 MG tablet      metoprolol (LOPRESSOR) 50 MG tablet      doxycycline (VIBRAMYCIN) 100 MG capsule          Discharge Condition:  Stable   Discharge Instructions Get Medicines reviewed and adjusted: Please take all your medications with you for your next visit with your Primary MD  Please request your Primary MD to go over all hospital tests and procedure/radiological results at the follow up, please ask your Primary MD to get all Hospital records sent to his/her office.  If you experience worsening of your admission symptoms, develop shortness of breath, life threatening emergency, suicidal or homicidal thoughts you must seek medical attention immediately by calling 911 or calling your MD immediately if symptoms less severe.  You must read complete instructions/literature along with all the possible adverse reactions/side effects for all the Medicines you take and that have been prescribed to you. Take any new Medicines after you have completely understood and accpet all the possible adverse reactions/side effects.   Do not drive when taking Pain medications.   Do not take more than prescribed Pain, Sleep and Anxiety Medications  Special Instructions: If you have smoked or chewed Tobacco in the last 2 yrs please stop smoking, stop any regular Alcohol and or any Recreational drug use.  Wear Seat belts while driving.  Please note  You were cared for by a hospitalist during your hospital stay. Once you are discharged, your primary care physician will handle any further medical issues. Please note that NO REFILLS for any discharge medications will be authorized once you are discharged, as it is imperative that you return to your primary care physician (or establish a relationship with a primary care physician if you do not have one) for your aftercare needs so that they can reassess your need for medications and monitor your lab values.  Discharge Instructions    Call MD for:  persistant nausea and vomiting    Complete by:  As directed    Call MD for:  redness, tenderness, or signs of infection (pain, swelling,  redness, odor or green/yellow discharge around incision site)    Complete by:  As directed    Call MD for:  severe uncontrolled pain    Complete by:  As directed    Diet - low sodium heart healthy    Complete by:  As directed    Increase activity slowly    Complete by:  As directed        Allergies  Allergen Reactions  . Iodine Anaphylaxis  . Penicillins Anaphylaxis    Rec'd cefazolin 02/04/14 pre-op for femur ORIF  . Shellfish Allergy Anaphylaxis  . Shellfish-Derived Products Anaphylaxis  . Sulfa Antibiotics Anaphylaxis  . Sulfacetamide Sodium Anaphylaxis  . Sulfasalazine Anaphylaxis  .  Atorvastatin     Other reaction(s): Unknown      Disposition: SNF   Consults:   Cardiology Infectious disease    Significant Diagnostic Studies:  Ct Head Wo Contrast  Result Date: 08/29/2016 CLINICAL DATA:  Initial evaluation for acute altered mental status. EXAM: CT HEAD WITHOUT CONTRAST TECHNIQUE: Contiguous axial images were obtained from the base of the skull through the vertex without intravenous contrast. COMPARISON:  Prior MRI from 07/23/2016. FINDINGS: Brain: Generalized age-related cerebral atrophy with chronic microvascular ischemic disease, stable. No acute intracranial hemorrhage. No evidence for acute large vessel territory infarct. Small calcified meningioma at the right posterior fossa noted without significant mass effect. No other mass lesion. No midline shift or mass effect. No hydrocephalus. No extra-axial fluid collection. Vascular: No hyperdense vessel. Scattered vascular calcifications noted within the carotid siphons. Skull: Scalp soft tissues demonstrate no acute abnormality. Calvarium intact. Sinuses/Orbits: Globes and orbits within normal limits. Patient is status post lens extraction bilaterally. Paranasal sinuses are clear. Trace left mastoid effusion. IMPRESSION: 1. No acute intracranial process identified. 2. Small calcified meningioma within the right posterior  fossa without significant mass effect, stable. 3. Mild atrophy with chronic small vessel ischemic disease. Electronically Signed   By: Jeannine Boga M.D.   On: 08/29/2016 05:30   Mr Foot Right W Wo Contrast  Result Date: 09/12/2016 CLINICAL DATA:  Bacteremia. Multiple ulcers of the right lower leg, ankle and foot EXAM: MRI OF THE RIGHT FOREFOOT WITHOUT AND WITH CONTRAST; MRI OF LOWER RIGHT EXTREMITY WITHOUT AND WITH CONTRAST TECHNIQUE: Multiplanar, multisequence MR imaging of the right foot was performed before and after the administration of intravenous contrast. Multiplanar, multisequence MR imaging of the right lower leg was performed before and after the administration of intravenous contrast. CONTRAST:  4m MULTIHANCE GADOBENATE DIMEGLUMINE 529 MG/ML IV SOLN COMPARISON:  None. FINDINGS: TENDONS Peroneal: Peroneal longus tendon intact. Peroneal brevis intact. Posteromedial: Posterior tibial tendon intact. Flexor hallucis longus tendon intact. Flexor digitorum longus tendon intact. Anterior: Tibialis anterior tendon intact. Extensor hallucis longus tendon intact Extensor digitorum longus tendon intact. Achilles:  Intact. Plantar Fascia: Mild focal thickening of the medial band of the plantar fascia at the calcaneal insertion concerning for mild plantar fasciitis. LIGAMENTS Lateral: Anterior talofibular ligament intact. Calcaneofibular ligament intact. Posterior talofibular ligament intact. Anterior and posterior tibiofibular ligaments intact. Medial: Deltoid ligament intact. Spring ligament intact. Lisfranc: Intact. CARTILAGE Ankle Joint: No joint effusion. Normal ankle mortise. No chondral defect. Subtalar Joints/Sinus Tarsi: Normal subtalar joints. No subtalar joint effusion. Normal sinus tarsi. Bones: No acute fracture dislocation. Prior arthrodesis of the first MTP joint. No focal marrow signal abnormality. No periosteal reaction or bone destruction. Soft Tissue: Severe to atrophy diffusely  throughout the plantar musculature. No focal fluid collection or hematoma. Generalized soft tissue edema throughout the right foot and right lower leg with enhancement concerning for cellulitis. Mild soft tissue edema of the left lower leg. IMPRESSION: 1. Generalized soft tissue edema throughout the right foot and right lower leg with enhancement concerning for cellulitis. No drainable fluid collection to suggest an abscess. 2. No osteomyelitis of the right lower leg or right ankle and foot. 3. Mild focal thickening of the medial band of the plantar fascia at the calcaneal insertion concerning for mild plantar fasciitis. Electronically Signed   By: HKathreen Devoid  On: 09/12/2016 14:58   Mr Tibia Fibula Right W Wo Contrast  Result Date: 09/12/2016 CLINICAL DATA:  Bacteremia. Multiple ulcers of the right lower leg, ankle and foot  EXAM: MRI OF THE RIGHT FOREFOOT WITHOUT AND WITH CONTRAST; MRI OF LOWER RIGHT EXTREMITY WITHOUT AND WITH CONTRAST TECHNIQUE: Multiplanar, multisequence MR imaging of the right foot was performed before and after the administration of intravenous contrast. Multiplanar, multisequence MR imaging of the right lower leg was performed before and after the administration of intravenous contrast. CONTRAST:  54m MULTIHANCE GADOBENATE DIMEGLUMINE 529 MG/ML IV SOLN COMPARISON:  None. FINDINGS: TENDONS Peroneal: Peroneal longus tendon intact. Peroneal brevis intact. Posteromedial: Posterior tibial tendon intact. Flexor hallucis longus tendon intact. Flexor digitorum longus tendon intact. Anterior: Tibialis anterior tendon intact. Extensor hallucis longus tendon intact Extensor digitorum longus tendon intact. Achilles:  Intact. Plantar Fascia: Mild focal thickening of the medial band of the plantar fascia at the calcaneal insertion concerning for mild plantar fasciitis. LIGAMENTS Lateral: Anterior talofibular ligament intact. Calcaneofibular ligament intact. Posterior talofibular ligament intact.  Anterior and posterior tibiofibular ligaments intact. Medial: Deltoid ligament intact. Spring ligament intact. Lisfranc: Intact. CARTILAGE Ankle Joint: No joint effusion. Normal ankle mortise. No chondral defect. Subtalar Joints/Sinus Tarsi: Normal subtalar joints. No subtalar joint effusion. Normal sinus tarsi. Bones: No acute fracture dislocation. Prior arthrodesis of the first MTP joint. No focal marrow signal abnormality. No periosteal reaction or bone destruction. Soft Tissue: Severe to atrophy diffusely throughout the plantar musculature. No focal fluid collection or hematoma. Generalized soft tissue edema throughout the right foot and right lower leg with enhancement concerning for cellulitis. Mild soft tissue edema of the left lower leg. IMPRESSION: 1. Generalized soft tissue edema throughout the right foot and right lower leg with enhancement concerning for cellulitis. No drainable fluid collection to suggest an abscess. 2. No osteomyelitis of the right lower leg or right ankle and foot. 3. Mild focal thickening of the medial band of the plantar fascia at the calcaneal insertion concerning for mild plantar fasciitis. Electronically Signed   By: HKathreen Devoid  On: 09/12/2016 14:58   Dg Chest Port 1 View  Result Date: 09/06/2016 CLINICAL DATA:  Pulmonary edema. EXAM: PORTABLE CHEST 1 VIEW COMPARISON:  08/31/2016 FINDINGS: The cardiac silhouette remains enlarged. Pulmonary vascular congestion and bilateral perihilar and basilar airspace opacities do not appear significantly changed. There are small bilateral pleural effusions, with fluid extending into the right minor fissure. No pneumothorax is seen. IMPRESSION: 1. Unchanged cardiomegaly and bilateral basilar predominant airspace disease compatible with edema. Superimposed infection not excluded in the lung bases. 2. Small pleural effusions. Electronically Signed   By: ALogan BoresM.D.   On: 09/06/2016 07:40   Dg Chest Port 1 View  Result Date:  08/31/2016 CLINICAL DATA:  Dyspnea.  History of pneumonia. EXAM: PORTABLE CHEST 1 VIEW COMPARISON:  08/30/2016 FINDINGS: The cardiac silhouette is enlarged. Mediastinal contours appear intact. Stable appearance of bilateral perihilar and lower lobe predominant airspace opacities. Mild increase of the interstitial markings. Probable bilateral pleural effusions. Osseous structures are without acute abnormality. Soft tissues are grossly normal. IMPRESSION: Cardiomegaly with interstitial pulmonary edema. Bilateral pleural effusions with lower lobe atelectasis versus airspace consolidation. Electronically Signed   By: DFidela SalisburyM.D.   On: 08/31/2016 16:14   Dg Chest Port 1 View  Result Date: 08/30/2016 CLINICAL DATA:  Dyspnea. EXAM: PORTABLE CHEST 1 VIEW COMPARISON:  08/29/2016 and 07/05/2016 FINDINGS: Lungs are somewhat hypoinflated with hazy perihilar and bibasilar opacification slightly worse. Findings may be due to edema versus infection. Cannot exclude a small amount of pleural fluid. Moderate stable cardiomegaly. Remainder the exam is unchanged. IMPRESSION: Slight worsening bilateral perihilar bibasilar opacification which  may be due to edema versus infection. Possible small amount of pleural fluid. Stable cardiomegaly. Electronically Signed   By: Marin Olp M.D.   On: 08/30/2016 19:26   Dg Chest Port 1 View  Result Date: 08/29/2016 CLINICAL DATA:  Fever EXAM: PORTABLE CHEST 1 VIEW COMPARISON:  07/05/2016 FINDINGS: Heart is enlarged. There is aortic atherosclerosis. There is atelectasis bilaterally. Minimal superimposed airspace opacities in the right upper and both lower lobes cannot exclude pneumonia. No acute osseous abnormality. IMPRESSION: Stable cardiomegaly. Atelectasis. Patchy right upper and bibasilar areas of superimposed airspace disease may reflect minimal pneumonia. Electronically Signed   By: Ashley Royalty M.D.   On: 08/29/2016 01:40   Dg Abd Portable 1v  Result Date:  09/10/2016 CLINICAL DATA:  Constipated and vomiting. EXAM: PORTABLE ABDOMEN - 1 VIEW COMPARISON:  None. FINDINGS: The bowel gas pattern is normal. No radio-opaque calculi or other significant radiographic abnormality are seen. IMPRESSION: Negative one view abdomen. Electronically Signed   By: San Morelle M.D.   On: 09/10/2016 10:09   Dg Abd Portable 1v  Result Date: 09/06/2016 CLINICAL DATA:  Constipation. EXAM: PORTABLE ABDOMEN - 1 VIEW COMPARISON:  03/05/2016, 02/11/2014.  CT 03/03/2016. FINDINGS: Right femoral catheter noted. Soft tissue structures are unremarkable. Calcification right upper quadrant again noted consist with a gallstone. 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 Barium is noted in the colon. Colonic diverticuli noted. Stool noted throughout the colon. Constipation cannot be excluded. No bowel distention or free air. Postsurgical changes left hip. Degenerative changes lumbar spine and both hips. IMPRESSION: 1. 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 2. Second calcific density noted in the right upper quadrant most consistent with a gallstone. 3. Air is noted throughout the colon. Colonic diverticuli are noted. Stool noted throughout the colon. Constipation cannot be excluded. 4.  Right femoral venous catheter noted. Electronically Signed   By: Marcello Moores  Register   On: 09/06/2016 10:50   Dg Swallowing Func-speech Pathology  Result Date: 08/30/2016 Objective Swallowing Evaluation: Type of Study: MBS-Modified Barium Swallow Study Patient Details Name: VIRTIE BUNGERT MRN: [id number] Date of Birth: 04/11/1941 Today's Date: 08/30/2016 Time: SLP Start Time (ACUTE ONLY): 1055-SLP Stop Time (ACUTE ONLY): 1112 SLP Time Calculation (min) (ACUTE ONLY): 17 min Past Medical History: Past Medical History: Diagnosis Date . Acute heart failure ([place])  . Aortic  stenosis  . Complication of anesthesia   Hard to wake patient up after having anesthesia . Diabetes mellitus without complication ([place])  . Diabetic neuropathy (HCC)   Takes Lyrica . Edema of both legs   Takes Lasix . GERD (gastroesophageal reflux disease)  . High cholesterol  . HTN (hypertension)  . Hypertension  . PAD (peripheral artery disease) ([place])  . Shortness of breath dyspnea  . Spasm of back muscles  . Stroke ([place])  . Wound, open   Right great toe Past Surgical History: Past Surgical History: Procedure Laterality Date . BYPASS GRAFT POPLITEAL TO TIBIAL Right 02/28/2016  Procedure: BYPASS GRAFT RIGHT BELOW KNEE POPLITEAL TO PERONEAL USING REVERSED RIGHT GREATER SAPHENOUS VEIN;  Surgeon: Conrad Rarden, MD;  Location: [facility];  Service: Vascular;  Laterality: Right; . CYST EXCISION    Abdomen . EYE SURGERY Bilateral   Cataract removal . INTRAMEDULLARY (IM) NAIL INTERTROCHANTERIC Left 02/04/2014  Procedure: INTRAMEDULLARY (IM) NAIL INTERTROCHANTRIC FEMORAL;  Surgeon: Mauri Pole, MD;  Location: [facility];  Service:  Orthopedics;  Laterality: Left; . IR GENERIC HISTORICAL  03/01/2016  IR ANGIO INTRA EXTRACRAN SEL COM CAROTID INNOMINATE UNI R MOD SED 03/01/2016 [name], MD [facility] . IR GENERIC HISTORICAL  03/01/2016  IR ENDOVASC INTRACRANIAL INF OTHER THAN THROMBO ART INC DIAG ANGIO 03/01/2016 [name], MD [facility] . IR GENERIC HISTORICAL  03/01/2016  IR INTRAVSC STENT CERV CAROTID W/O EMB-PROT MOD SED INC ANGIO 03/01/2016 [name], MD [facility] . IR GENERIC HISTORICAL  06/03/2016  IR RADIOLOGIST EVAL & MGMT 06/03/2016 [facility] . ORIF TOE FRACTURE Right 02/08/2014  Procedure: OPEN REDUCTION INTERNAL FIXATION Right METATARSAL  FRACTURE ;  Surgeon: Wylene Simmer, MD;  Location: [facility];  Service: Orthopedics;  Laterality: Right; . PERIPHERAL VASCULAR CATHETERIZATION N/A 12/28/2015  Procedure: Abdominal Aortogram w/Lower Extremity;  Surgeon: Conrad Martinsburg, MD;  Location: [facility];   Service: Cardiovascular;  Laterality: N/A; . RADIOLOGY WITH ANESTHESIA N/A 03/01/2016  Procedure: RADIOLOGY WITH ANESTHESIA;  Surgeon: [name], MD;  Location: [facility];  Service: Radiology;  Laterality: N/A; . VEIN HARVEST Right 02/28/2016  Procedure: RIGHT GREATER SAPHENOUS VEIN HARVEST;  Surgeon: Conrad Floyd Hill, MD;  [facility];  Service: Vascular;  Laterality: Right; HPI: Pt is a 76 y.o. female with PMH of DM2 and HTN, presented to ED 1/25 with AMS. Head CT negative, CXR showing patchy R upper/ bibasilar areas of superimposed airspace disease concerning for minimal PNA. Pt was followed by speech therapy in August 2017 following acute stroke; had MBS on 8/8 with recommendations for dysphagia 1/ honey thick liquids by tsp. Bedside swallow eval ordered. No Data Recorded Assessment / Plan / Recommendation CHL IP CLINICAL IMPRESSIONS 08/30/2016 Therapy Diagnosis WFL Clinical Impression Pt currently with oropharyngeal swallow function within functional limits. Pt does have premature spill to the level of the vallecula across consistencies but this did not result in any penetration or aspiration with numerous trials provided. Recommend initiating regular diet, thin liquids, meds whole with liquid, full supervision to assist with feeding during meals; also follow reflux precautions given hx of GERD. Worth noting that pt is concerned about having "hard foods" after not eating for a couple of days and may have preference for soft solids initially.SLP will sign off at this time; please re-consult if needs arise.  Impact on safety and function Mild aspiration risk   CHL IP TREATMENT RECOMMENDATION 08/30/2016 Treatment Recommendations No treatment recommended at this time           CHL IP DIET RECOMMENDATION 08/30/2016 SLP Diet Recommendations Regular solids;Thin liquid Liquid Administration via Cup;Straw Medication Administration Whole meds with liquid Compensations Slow rate;Small sips/bites Postural Changes Seated  upright at 90 degrees;Remain semi-upright after after feeds/meals (Comment)   CHL IP OTHER RECOMMENDATIONS 08/30/2016 Recommended Consults -- Oral Care Recommendations Oral care BID Other Recommendations --   CHL IP FOLLOW UP RECOMMENDATIONS 08/30/2016 Follow up Recommendations None        CHL IP ORAL PHASE 08/30/2016 Oral Phase Impaired Oral - Pudding Teaspoon -- Oral - Pudding Cup -- Oral - Honey Teaspoon -- Oral - Honey Cup -- Oral - Nectar Teaspoon -- Oral - Nectar Cup -- Oral - Nectar Straw -- Oral - Thin Teaspoon -- Oral - Thin Cup Premature spillage Oral - Thin Straw Premature spillage Oral - Puree Premature spillage Oral - Mech Soft -- Oral - Regular Premature spillage Oral - Multi-Consistency -- Oral - Pill -- Oral Phase - Comment --  CHL IP PHARYNGEAL PHASE 08/30/2016 Pharyngeal Phase [facility]  Pharyngeal- Pudding Teaspoon -- Pharyngeal -- Pharyngeal- Pudding Cup -- Pharyngeal -- Pharyngeal- Honey Teaspoon -- Pharyngeal -- Pharyngeal- Honey Cup -- Pharyngeal -- Pharyngeal- Nectar Teaspoon -- Pharyngeal -- Pharyngeal- Nectar Cup -- Pharyngeal -- Pharyngeal- Nectar Straw -- Pharyngeal -- Pharyngeal- Thin Teaspoon -- Pharyngeal -- Pharyngeal- Thin Cup -- Pharyngeal -- Pharyngeal- Thin Straw -- Pharyngeal -- Pharyngeal- Puree -- Pharyngeal -- Pharyngeal- Mechanical Soft -- Pharyngeal -- Pharyngeal- Regular -- Pharyngeal -- Pharyngeal- Multi-consistency -- Pharyngeal -- Pharyngeal- Pill -- Pharyngeal -- Pharyngeal Comment --  CHL IP CERVICAL ESOPHAGEAL PHASE 08/30/2016 Cervical Esophageal Phase WFL Pudding Teaspoon -- Pudding Cup -- Honey Teaspoon -- Honey Cup -- Nectar Teaspoon -- Nectar Cup -- Nectar Straw -- Thin Teaspoon -- Thin Cup -- Thin Straw -- Puree -- Mechanical Soft -- Regular -- Multi-consistency -- Pill -- Cervical Esophageal Comment -- No flowsheet data found. Kern Reap, MA, CCC-SLP 08/30/2016, 11:18 AM [phone]             2-D echo LV EF: 60% -    65%  ------------------------------------------------------------------- Indications:      CHF - 428.0.  ------------------------------------------------------------------- History:   PMH:  Aortic stenosis.  Dyspnea.  Stroke.  Risk factors:  Hypertension. Diabetes mellitus. Dyslipidemia.  ------------------------------------------------------------------- Study Conclusions  - Procedure narrative: Transthoracic echocardiography. Image   quality was adequate. The study was technically difficult. - Left ventricle: The cavity size was normal. Wall thickness was   increased in a pattern of moderate LVH. Systolic function was   normal. The estimated ejection fraction was in the range of 60%   to 65%. Wall motion was normal; there were no regional wall   motion abnormalities. Doppler parameters are consistent with   abnormal left ventricular relaxation (grade 1 diastolic   dysfunction). - Aortic valve: Probably trileaflet; severely calcified leaflets.   There was mild to moderate stenosis. Mean gradient (S): 13 mm Hg.   Peak gradient (S): 22 mm Hg. Valve area (VTI): 1.3 cm^2. - Mitral valve: Moderately calcified annulus. Mildly calcified   leaflets . There was no significant regurgitation. - Right ventricle: The cavity size was normal. Systolic function   was normal. - Pulmonary arteries: No complete TR doppler jet so unable to   estimate PA systolic pressure. - Systemic veins: IVC measured 1.7 cm with < 50% respirophasic   variation, suggesting RA pressure 8 mmHg.  Impressions:  - Normal LV size with moderate LV hypertrophy. EF 60-65%. Normal RV   size and systolic function. Mild to moderate aortic stenosis   (moderate visually and by calculated valve area, mild by mean   gradient).   TEE     Filed Weights   09/14/16 0558 09/16/16 0459 09/17/16 0427  Weight: 97.1 kg (214 lb) 94.3 kg (208 lb) 95.3 kg (210 lb 3.2 oz)     Microbiology: No results found for this or  any previous visit (from the past 240 hour(s)).     Blood Culture    Component Value Date/Time   SDES BLOOD LEFT ARM 08/30/2016 1348   SDES BLOOD LEFT ARM 08/30/2016 1348   SPECREQUEST IN PEDIATRIC BOTTLE .5CC 08/30/2016 1348   SPECREQUEST IN PEDIATRIC BOTTLE 1CC 08/30/2016 1348   CULT NO GROWTH 5 DAYS 08/30/2016 1348   CULT NO GROWTH 5 DAYS 08/30/2016 1348   REPTSTATUS 09/04/2016 FINAL 08/30/2016 1348   REPTSTATUS 09/04/2016 FINAL 08/30/2016 1348      Labs: Results for orders placed or performed during the hospital encounter of 08/28/16 (from the past 48 hour(s))  Glucose, capillary     Status: Abnormal   Collection Time: 09/15/16  8:05 PM  Result Value Ref Range   Glucose-Capillary 259 (H) 65 - 99 mg/dL  Glucose, capillary     Status: Abnormal   Collection Time: 09/16/16 12:04 AM  Result Value Ref Range   Glucose-Capillary 210 (H) 65 - 99 mg/dL  Glucose, capillary     Status: Abnormal   Collection Time: 09/16/16  4:56 AM  Result Value Ref Range   Glucose-Capillary 172 (H) 65 - 99 mg/dL  Basic metabolic panel     Status: Abnormal   Collection Time: 09/16/16  5:06 AM  Result Value Ref Range   Sodium 135 135 - 145 mmol/L   Potassium 4.5 3.5 - 5.1 mmol/L    Comment: DELTA CHECK NOTED HEMOLYSIS AT THIS LEVEL MAY AFFECT RESULT    Chloride 99 (L) 101 - 111 mmol/L   CO2 26 22 - 32 mmol/L   Glucose, Bld 180 (H) 65 - 99 mg/dL   BUN 12 6 - 20 mg/dL   Creatinine, Ser 0.88 0.44 - 1.00 mg/dL   Calcium 9.0 8.9 - 10.3 mg/dL   GFR calc non Af Amer >60 >60 mL/min   GFR calc Af Amer >60 >60 mL/min    Comment: (NOTE) The eGFR has been calculated using the CKD EPI equation. This calculation has not been validated in all clinical situations. eGFR's persistently <60 mL/min signify possible Chronic Kidney Disease.    Anion gap 10 5 - 15  Protime-INR     Status: None   Collection Time: 09/16/16  5:06 AM  Result Value Ref Range   Prothrombin Time 13.9 11.4 - 15.2 seconds   INR  1.07   Glucose, capillary     Status: Abnormal   Collection Time: 09/16/16  7:18 AM  Result Value Ref Range   Glucose-Capillary 158 (H) 65 - 99 mg/dL  Glucose, capillary     Status: Abnormal   Collection Time: 09/16/16 11:30 AM  Result Value Ref Range   Glucose-Capillary 175 (H) 65 - 99 mg/dL  Glucose, capillary     Status: Abnormal   Collection Time: 09/16/16  4:44 PM  Result Value Ref Range   Glucose-Capillary 160 (H) 65 - 99 mg/dL  Glucose, capillary     Status: Abnormal   Collection Time: 09/16/16  9:07 PM  Result Value Ref Range   Glucose-Capillary 218 (H) 65 - 99 mg/dL  Glucose, capillary     Status: Abnormal   Collection Time: 09/17/16 12:18 AM  Result Value Ref Range   Glucose-Capillary 204 (H) 65 - 99 mg/dL  Glucose, capillary     Status: Abnormal   Collection Time: 09/17/16  4:26 AM  Result Value Ref Range   Glucose-Capillary 149 (H) 65 - 99 mg/dL  Glucose, capillary     Status: Abnormal   Collection Time: 09/17/16  8:11 AM  Result Value Ref Range   Glucose-Capillary 169 (H) 65 - 99 mg/dL  Glucose, capillary     Status: Abnormal   Collection Time: 09/17/16 12:20 PM  Result Value Ref Range   Glucose-Capillary 153 (H) 65 - 99 mg/dL     Lipid Panel     Component Value Date/Time   CHOL 109 03/02/2016 0600   TRIG 71 03/06/2016 0500   HDL 32 (L) 03/02/2016 0600   CHOLHDL 3.4 03/02/2016 0600   VLDL 24 03/02/2016 0600   LDLCALC 53 03/02/2016 0600     Lab Results  Component Value Date  HGBA1C 8.4 (H) 07/06/2016   HGBA1C 7.7 (H) 03/02/2016   HGBA1C 8.1 (H) 02/29/2016       HPI :   [name] is an 76 y.o. 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 ,. She was febrile to T102F and treated initially with vancomycin and aztreonam given her  penicillin allergy, which she describes as "swelling all over," but was transitioned to levofloxacin monotherapy with pan-sensitive Group B Strep on blood cultures. TTE 1/29 did not comment on any vegetations though noted severe calcifications of her aortic trileaflets. Repeat blood cultures have been without growth.   She has chronic right lower extremity wounds, and ABIs 12/28/15 noted severe tibial disease bilaterally for which she underwent bypass grafting on 02/28/16 and then developed subsequent ischemic infarcts of the left basal ganglia and left internal capsule as noted on brain MRI 03/02/16. At baseline, she is mostly sedentary at home with her husband.She was fluid resuscitated on admission due to sepsis,  required IV diuresis due to Pulmonary edema and acute on chronic [facility]  HOSPITAL COURSE:   Sepsis due to Group B strep bacteremia - Blood cultures were positive for group B strep 1/25, initiated on Levaquin   based on antibiogram.  . 2-D echo 1/29 did not show any evidence of endocarditis. ID consulted for further recommendations and for antibiotic management - Surveillance cultures obtained 1/26, no growth to date - Patient remains on PO levaquin since  1/25, as per infectious disease group B streptococcal bacteremia probably originated from one of her open wounds.  ID concerned about deep infection of the bones in the right ankle and foot and  MRI no evidence of deep infection such as an abscess or septic joint or osteomyelitis Infectious disease recommends TEE to rule out endocarditis, which was completed on 2/12    Acute hypoxic respiratory failure due to HCAP and Pulmonary edema - Likely due to fluid resuscitation in the setting of sepsis. Chest x-ray showed slight pulmonary vascular congestion versus worsening of her pneumonia.  Breathing is improving, 97%on RA  Changed Lasix to by mouth 2/7,foley dc'd  Documented bodyweight likely inaccurate as the patient has not lost any weight  since 1/25 but the swelling has definitely improved Continue to monitor renal function/potassium     Acute on chronic diastolic CHF - Most recent 2-D echo was done roughly 6 months ago on July 2017 and showed an ejection fraction of 55-60% as well as grade 2 diastolic dysfunction.  - Patient now fluid overload in the setting of fluid resuscitation due to sepsis - repeat 2d echo showed normal EF, and grade 1 diastolic dysfunction - For now, will continue by mouth Lasix 40 mg twice a day   Hypokalemia, subsequently developed hyperkalemia-  potassium was 6.0, 2/11. Patient is to receive Kayexalate and IV Lasix 1 Potassium now 4.5 prior to discharge   HCAP - secondary tostrep pneumonia Completed antibiotic therapy  Acute encephalopathy - Appears to be slowly resolving, suspect secondary to #1 and #2  - Alert, conversant, appears to interact appropriately - Stable  Hypertension - Continued on Coreg, Norvasc - Remains stable at present  Hyperlipidemia - Will continue statin as tolerated - Currently stable  Type 2 diabetes mellitus - For now, will continue lantus and sliding scale, improving - glucose remains in the mid-100's   Right lower  extremity ulcer /  Diabetic foot ulcer  - wound care was consulted, ID consulted - remains stable there is no evidence of deep infection such as an abscess or septic joint or osteomyelitis - Right heel with deep tissue injury; 2X1cm, dark purple with intact skin. Measurement: Right anterior great toe with chronic full thickness wound, 2.8X.2X.1cm, dry yellow wound bed, no odor, drainage, or fluctuance - Right lower calf with chronic full thickness stasis ulcer; 4X6X.1cm, 90% red, 10% yellow, small amt yellow drainage, no odor. - Right middle calf with 2 full thickness wound; 3.8X.3X.3cm, 85% red, 15% yellow, small amt yellow drainage, no odor. - Dressing procedure/placement/frequency: Bactroban to promote moist healing, foam  dressing to protect from further injury. Float heel to reduce pressure to deep tissue injury location.   Peripheral artery disease  - Patient is continued on Plavix  - Currently remains stable  Constipation Continue MiraLAX, patient had a large bowel movement yesterday Trial of Dulcolax suppository      Discharge Exam: *  Blood pressure (!) 140/49, pulse 70, temperature 98.6 F (37 C), temperature source Oral, resp. rate 14, height '5\' 4"'  (1.626 m), weight 95.3 kg (210 lb 3.2 oz), SpO2 98 %.   General exam: in nad, awake, conversant Respiratory system: normal resp effort, no audible wheezing Cardiovascular system: regular rhythm, s1-s2 Gastrointestinal system: mildly distended, pos BS, nondistended Central nervous system: no seizures, no tremors Extremities: perfused, no clubbing Skin: no rashes//no pallor Psychiatry: affect normal// no auditory hallucinations.    Contact information for after-discharge care    [facility] .   Specialty:  [place] information: [place] [phone]              Signed: Leisa Lenz 09/17/2016, 1:13 PM        Time spent >45 mins

## 2016-09-17 NOTE — Progress Notes (Signed)
Spoke with infectious disease, Dr. Tommy Medal in regards to duration of Levaquin on discharge. He recommends 7 more days of treatment considering patient already has received 3 weeks of Levaquin so far. Will defer TEE for now as no clear evidence that she does have endocarditis.  Please refer to discharge summary completed 09/16/2016. Made adjustments to Levaquin amount instead of 5 days to 7 days on discharge.  Leisa Lenz [facility] [phone]

## 2016-09-18 SURGERY — ECHOCARDIOGRAM, TRANSESOPHAGEAL
Anesthesia: Monitor Anesthesia Care

## 2016-10-14 ENCOUNTER — Encounter: Payer: Self-pay | Admitting: Vascular Surgery

## 2016-10-21 NOTE — Progress Notes (Signed)
Established Bypass  History of Present Illness  [name] is a 76 y.o. ([birth year]) female  who presents for postoperative follow-up for:   1. Right below-the-knee popliteal artery to peroneal artery bypass with reversed greater saphenous vein  2. Endarterectomy of mid-segment peroneal artery(Date: 02/28/16).   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  The patient was admitted recently with sepsis felt related to her chronic wounds in her R leg and B pneumonia.  The patient's husband thinks the right leg had near healed prior to the wound recently reopening.  The patient is getting wound care in [place].    The patient's neurologic deficits reportedly are better but she still has weakness in R side with inability to walk and continued problems with word findings.  Her husband notes since her hospitalization she is essentially wheelchair dependent.    PMH, PSH, FmH, SH are all unchanged.  Current Outpatient Prescriptions  Medication Sig Dispense Refill  . acetaminophen (TYLENOL) 325 MG tablet Take 2 tablets (650 mg total) by mouth every 6 (six) hours as needed for moderate pain or fever. 30 tablet 1  . albuterol (PROVENTIL HFA;VENTOLIN HFA) 108 (90 Base) MCG/ACT inhaler Inhale 2 puffs into the lungs every 6 (six) hours as needed.    [name] amLODipine (NORVASC) 10 MG tablet Take 1 tablet (10 mg total) by mouth daily. 30 tablet 3  . aspirin 325 MG tablet Take 1 tablet (325 mg total) by mouth daily with breakfast. (Patient taking differently: Take 81 mg by mouth daily with breakfast. ) 30 tablet 0  . atorvastatin (LIPITOR) 20 MG tablet Take 1 tablet (20 mg total) by mouth daily. 30 tablet 11  . carvedilol (COREG) 12.5 MG tablet Take 12.5 mg by mouth 2 (two) times daily with a meal.    . cetirizine (ZYRTEC) 10 MG tablet Take 10 mg by mouth  daily.     . cholecalciferol (VITAMIN D) 1000 units tablet Take 1,000 Units by mouth daily.    . citalopram (CELEXA) 20 MG tablet Take 20 mg by mouth daily.    . clopidogrel (PLAVIX) 75 MG tablet Take 1 tablet (75 mg total) by mouth daily with breakfast. 30 tablet 3  . collagenase (SANTYL) ointment Apply topically 2 (two) times daily. Apply to dorsum right foot wound daily. (Patient not taking: Reported on 08/29/2016) 15 g 0  . docusate sodium (COLACE) 100 MG capsule Take 100 mg by mouth 2 (two) times daily.    . fluticasone (FLONASE) 50 MCG/ACT nasal spray Place 1 spray into both nostrils daily.    . furosemide (LASIX) 40 MG tablet Take 1 tablet (40 mg total) by mouth 2 (two) times daily. 30 tablet 1  . insulin aspart (NOVOLOG) 100 UNIT/ML injection Inject 0-15 Units into the skin 3 (three) times daily with meals. Before each meal 3 times a day, 140-199 - 3 units, 200-250 - 5 units, 251-299 - 8 units,  300-349 - 12 units,  350 or above 15 units. (Patient taking differently: Inject 12 Units into the skin 3 (three) times daily with meals. ) 10 mL 11  . insulin glargine (LANTUS) 100 UNIT/ML injection Inject 0.27 mLs (27 Units total) into the skin at bedtime. 10 mL 11  . isosorbide mononitrate (IMDUR) 60 MG 24 hr tablet Take 1 tablet (60 mg total) by mouth  daily. 30 tablet 0  . nitroGLYCERIN (NITROSTAT) 0.4 MG SL tablet Place 1 tablet (0.4 mg total) under the tongue every 5 (five) minutes as needed for chest pain. 3 tablet 12  . omeprazole (PRILOSEC) 20 MG capsule Take 20 mg by mouth daily.    . polyethylene glycol (MIRALAX / GLYCOLAX) packet Take 17 g by mouth 2 (two) times daily. (Patient taking differently: Take 17 g by mouth daily as needed for mild constipation. ) 14 each 0  . potassium chloride SA (K-DUR,KLOR-CON) 20 MEQ tablet Take 20 mEq by mouth daily.    . pregabalin (LYRICA) 50 MG capsule Take 50 mg by mouth 3 (three) times daily.    . ranolazine (RANEXA) 500 MG 12 hr tablet Take 1 tablet (500  mg total) by mouth 2 (two) times daily. 60 tablet 0  . VITAMIN E PO Take 1 capsule by mouth daily.     No current facility-administered medications for this visit.      Allergies  Allergen Reactions  . Iodine Anaphylaxis  . Penicillins Anaphylaxis    Rec'd cefazolin 02/04/14 pre-op for femur ORIF  . Shellfish Allergy Anaphylaxis  . Shellfish-Derived Products Anaphylaxis  . Sulfa Antibiotics Anaphylaxis  . Sulfacetamide Sodium Anaphylaxis  . Sulfasalazine Anaphylaxis  . Atorvastatin     Other reaction(s): Unknown   On ROS: no further fever or chills, improvement in R foot wounds   Physical Examination  Vitals:   10/25/16 0949  BP: (!) 157/68  Pulse: 72  Resp: 20  Temp: 97 F (36.1 C)  TempSrc: Oral  SpO2: 96%  Weight: 215 lb (97.5 kg)  Height: 5\' 4"  (1.626 m)    Body mass index is 36.9 kg/m.  General: Somulent, Confused, WD, Ill appearing  Pulmonary: Sym exp, good B air movt, CTA B  Cardiac: RRR, Nl S1, S2, no Murmurs, No rubs, No S3,S4  Vascular: Vessel Right Left  Radial Faintly palpable Faintly palpable  Brachial Palpable Palpable  Carotid Palpable Palpable  Aorta Not palpable due to pannus N/A  Femoral Not palpable in wheelchair Not palpable in wheelchair  Popliteal Not palpable Not palpable  PT Not palpable Not palpable  DP Not palpable Not palpable   Gastrointestinal: soft, non-distended, non-tender to palpation, No guarding or rebound, no HSM, no masses, no CVAT B, No palpable prominent aortic pulse,    Musculoskeletal: in wheelchair, limited exam due to somulence. R foot with ischemic appearing 3rd and 1st toe tips, R 1st metatarsal with some serous drainage, BLE edema 2+  Neurologic: Pain and light touch decreased in both feet, limited exam due to decreased level of consciousness  , Motor exam as listed above  R foot MRI (09/12/16): 1. Generalized soft tissue edema throughout the right foot and right lower leg with enhancement concerning for  cellulitis. No drainable fluid collection to suggest an abscess. 2. No osteomyelitis of the right lower leg or right ankle and foot. 3. Mild focal thickening of the medial band of the plantar fascia at the calcaneal insertion concerning for mild plantar fasciitis.   ABI (Date: 10/25/2016)  R:   ABI: 0.48,   DP: mono  PT: none   Peroneal: mono  L:   ABI: 0.63,   DP: mono  PT: mono  RLE arterial duplex (10/25/2016)  Technically difficult study  Inflow: 37 c/s  Bypass: 30-64 c/s  Outflow: 38 c/s  Graft appears patent  Medical Decision Making  [name] is a 76 y.o. 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  Given this patient's overall poor functional status, I would limit any interventions at this point.  Would resume wound care at the outpatient wound center in [facility]  Repeat ABI and RLE arterial duplex in 3 months.   Adele Barthel, MD, FACS [facility] Office: [phone] Pager: [phone]

## 2016-10-25 ENCOUNTER — Ambulatory Visit (INDEPENDENT_AMBULATORY_CARE_PROVIDER_SITE_OTHER)
Admission: RE | Admit: 2016-10-25 | Discharge: 2016-10-25 | Disposition: A | Discharge status: Home / Self Care | Payer: Medicare HMO | Source: Ambulatory Visit | Attending: Vascular Surgery | Admitting: Vascular Surgery

## 2016-10-25 ENCOUNTER — Ambulatory Visit (HOSPITAL_COMMUNITY)
Admission: RE | Admit: 2016-10-25 | Discharge: 2016-10-25 | Disposition: A | Discharge status: Home / Self Care | Payer: Medicare HMO | Source: Ambulatory Visit | Attending: Vascular Surgery | Admitting: Vascular Surgery

## 2016-10-25 ENCOUNTER — Encounter: Payer: Self-pay | Admitting: Vascular Surgery

## 2016-10-25 ENCOUNTER — Ambulatory Visit (INDEPENDENT_AMBULATORY_CARE_PROVIDER_SITE_OTHER): Payer: Medicare HMO | Admitting: Vascular Surgery

## 2016-10-25 VITALS — BP 157/68 | HR 72 | Temp 97.0°F | Resp 20 | Ht 64.0 in | Wt 215.0 lb

## 2016-10-25 DIAGNOSIS — I7025 Atherosclerosis of native arteries of other extremities with ulceration: Secondary | ICD-10-CM

## 2016-10-25 DIAGNOSIS — Z48812 Encounter for surgical aftercare following surgery on the circulatory system: Secondary | ICD-10-CM | POA: Diagnosis not present

## 2016-10-25 DIAGNOSIS — I739 Peripheral vascular disease, unspecified: Secondary | ICD-10-CM

## 2016-10-25 DIAGNOSIS — I779 Disorder of arteries and arterioles, unspecified: Secondary | ICD-10-CM | POA: Insufficient documentation

## 2016-10-30 ENCOUNTER — Other Ambulatory Visit: Payer: Self-pay | Admitting: Family Medicine

## 2016-10-30 DIAGNOSIS — Z13820 Encounter for screening for osteoporosis: Secondary | ICD-10-CM

## 2016-10-30 NOTE — Addendum Note (Signed)
Addended by: Lianne Cure A on: 10/30/2016 09:15 AM   Modules accepted: Orders

## 2016-11-06 ENCOUNTER — Encounter: Payer: Medicare HMO | Attending: Nurse Practitioner | Admitting: Nurse Practitioner

## 2016-11-06 DIAGNOSIS — D649 Anemia, unspecified: Secondary | ICD-10-CM | POA: Insufficient documentation

## 2016-11-06 DIAGNOSIS — L97219 Non-pressure chronic ulcer of right calf with unspecified severity: Secondary | ICD-10-CM | POA: Insufficient documentation

## 2016-11-06 DIAGNOSIS — Z91013 Allergy to seafood: Secondary | ICD-10-CM | POA: Insufficient documentation

## 2016-11-06 DIAGNOSIS — Z882 Allergy status to sulfonamides status: Secondary | ICD-10-CM | POA: Diagnosis not present

## 2016-11-06 DIAGNOSIS — E11622 Type 2 diabetes mellitus with other skin ulcer: Secondary | ICD-10-CM | POA: Diagnosis not present

## 2016-11-06 DIAGNOSIS — Z888 Allergy status to other drugs, medicaments and biological substances status: Secondary | ICD-10-CM | POA: Insufficient documentation

## 2016-11-06 DIAGNOSIS — Z794 Long term (current) use of insulin: Secondary | ICD-10-CM | POA: Insufficient documentation

## 2016-11-06 DIAGNOSIS — E785 Hyperlipidemia, unspecified: Secondary | ICD-10-CM | POA: Diagnosis not present

## 2016-11-06 DIAGNOSIS — L97519 Non-pressure chronic ulcer of other part of right foot with unspecified severity: Secondary | ICD-10-CM | POA: Insufficient documentation

## 2016-11-06 DIAGNOSIS — Z880 Allergy status to penicillin: Secondary | ICD-10-CM | POA: Diagnosis not present

## 2016-11-06 DIAGNOSIS — I70232 Atherosclerosis 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 of calf: Secondary | ICD-10-CM | POA: Insufficient documentation

## 2016-11-06 DIAGNOSIS — I350 Nonrheumatic aortic (valve) stenosis: Secondary | ICD-10-CM | POA: Diagnosis not present

## 2016-11-06 DIAGNOSIS — I509 Heart failure, unspecified: Secondary | ICD-10-CM | POA: Insufficient documentation

## 2016-11-06 DIAGNOSIS — E1152 Type 2 diabetes mellitus with diabetic peripheral angiopathy with gangrene: Secondary | ICD-10-CM | POA: Insufficient documentation

## 2016-11-06 DIAGNOSIS — I110 Hypertensive heart disease with heart failure: Secondary | ICD-10-CM | POA: Insufficient documentation

## 2016-11-06 DIAGNOSIS — I70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 of foot: Secondary | ICD-10-CM | POA: Insufficient documentation

## 2016-11-06 DIAGNOSIS — Z8673 Personal history of transient ischemic attack (TIA), and cerebral infarction without residual deficits: Secondary | ICD-10-CM | POA: Diagnosis not present

## 2016-11-06 DIAGNOSIS — E43 Unspecified severe protein-calorie malnutrition: Secondary | ICD-10-CM | POA: Diagnosis not present

## 2016-11-06 DIAGNOSIS — Z91012 Allergy to eggs: Secondary | ICD-10-CM | POA: Insufficient documentation

## 2016-11-06 DIAGNOSIS — E11621 Type 2 diabetes mellitus with foot ulcer: Secondary | ICD-10-CM | POA: Insufficient documentation

## 2016-11-07 NOTE — Progress Notes (Signed)
PAZ, FUENTES ([id number]) Visit Report for 11/06/2016 Chief Complaint Document Details Patient Name: [name], [name] Date of Service: 11/06/2016 8:15 AM Medical Record Number: [id number] Patient Account Number: [id number] Date of Birth/Sex: 1941-06-01 (76 y.o. Female) Treating RN: Baruch Gouty, RN, BSN, Velva Harman Primary Care Provider: Beverlyn Roux Other Clinician: Referring Provider: Beverlyn Roux Treating Provider/Extender: Cathie Olden in Treatment: 0 Information Obtained from: Patient Chief Complaint The patient is here for initial evaluation of RLE and Right foot/toe ulcers Electronic Signature(s) Signed: 11/06/2016 11:20:21 AM By: Lawanda Cousins Entered By: Lawanda Cousins on 11/06/2016 11:20:21 [name] ([id number]) -------------------------------------------------------------------------------- Debridement Details Patient Name: [name], [name] Date of Service: 11/06/2016 8:15 AM Medical Record Number: [id number] Patient Account Number: [id number] Date of Birth/Sex: 06-May-1941 (76 y.o. Female) Treating RN: Afful, RN, BSN, Velva Harman Primary Care Provider: Beverlyn Roux Other Clinician: Referring Provider: Beverlyn Roux Treating Provider/Extender: Cathie Olden in Treatment: 0 Debridement Performed for Wound #7 Right,Dorsal Metatarsal head first Assessment: Performed By: Clinician Cornell Barman, RN Debridement: Chemical/enzymatic Debridement Non-Selective Description: Pre-procedure No Verification/Time Out Taken: Procedural Pain: 0 Post Procedural Pain: 0 Response to Treatment: Procedure was tolerated well Post Debridement Measurements of Total Wound Length: (cm) 2.5 Width: (cm) 0.6 Depth: (cm) 0.3 Volume: (cm) 0.353 Character of Wound/Ulcer Post Stable Debridement: Severity of Tissue Post Debridement: Fat layer exposed Post Procedure Diagnosis Same as Pre-procedure Electronic Signature(s) Signed: 11/06/2016 11:19:42 AM By: Lawanda Cousins Signed: 11/06/2016 4:20:09 PM By:  Regan Lemming BSN, RN Entered By: Lawanda Cousins on 11/06/2016 11:19:42 Lacock, Judythe V. ([id number]) -------------------------------------------------------------------------------- Debridement Details Patient Name: [name], [name] Date of Service: 11/06/2016 8:15 AM Medical Record Number: [id number] Patient Account Number: [id number] Date of Birth/Sex: 13-Aug-1940 (76 y.o. Female) Treating RN: Cornell Barman Primary Care Provider: Beverlyn Roux Other Clinician: Referring Provider: Beverlyn Roux Treating Provider/Extender: Cathie Olden in Treatment: 0 Debridement Performed for Wound #5 Right,Distal Lower Leg Assessment: Performed By: Clinician Cornell Barman, RN Debridement: Chemical/enzymatic Debridement Non-Selective Description: Pre-procedure No Verification/Time Out Taken: Procedural Pain: 0 Post Procedural Pain: 0 Response to Treatment: Procedure was tolerated well Post Debridement Measurements of Total Wound Length: (cm) 1.5 Width: (cm) 1.5 Depth: (cm) 0.1 Volume: (cm) 0.177 Character of Wound/Ulcer Post Stable Debridement: Severity of Tissue Post Debridement: Fat layer exposed Post Procedure Diagnosis Same as Pre-procedure Electronic Signature(s) Signed: 11/06/2016 4:22:06 PM By: Lawanda Cousins Signed: 11/06/2016 5:07:31 PM By: Gretta Cool, RN, BSN, Kim RN, BSN Entered By: Gretta Cool, RN, BSN, Kim on 11/06/2016 15:50:38 [name], [name] ([id number]) -------------------------------------------------------------------------------- Debridement Details Patient Name: [name], [name] Date of Service: 11/06/2016 8:15 AM Medical Record Number: [id number] Patient Account Number: [id number] Date of Birth/Sex: [birth year] (76 y.o. Female) Treating RN: Cornell Barman Primary Care Provider: Beverlyn Roux Other Clinician: Referring Provider: Beverlyn Roux Treating Provider/Extender: Cathie Olden in Treatment: 0 Debridement Performed for Wound #6 Right,Proximal Lower Leg Assessment: Performed By:  Clinician Cornell Barman, RN Debridement: Chemical/enzymatic Debridement Non-Selective Description: Pre-procedure No Verification/Time Out Taken: Procedural Pain: 0 Post Procedural Pain: 0 Response to Treatment: Procedure was tolerated well Post Debridement Measurements of Total Wound Length: (cm) 1 Width: (cm) 0.4 Depth: (cm) 0.1 Volume: (cm) 0.031 Character of Wound/Ulcer Post Stable Debridement: Severity of Tissue Post Debridement: Fat layer exposed Post Procedure Diagnosis Same as Pre-procedure Electronic Signature(s) Signed: 11/06/2016 4:22:06 PM By: Lawanda Cousins Signed: 11/06/2016 5:07:31 PM By: Gretta Cool, RN, BSN, Kim RN, BSN Entered By: Gretta Cool, RN, BSN, Kim on 11/06/2016 15:51:02 [name] ([id number]) -------------------------------------------------------------------------------- Debridement Details Patient Name: [name], [name] Date of Service:  11/06/2016 8:15 AM Medical Record Number: [id number] Patient Account Number: [id number] Date of Birth/Sex: 04-19-1941 (76 y.o. Female) Treating RN: Cornell Barman Primary Care Provider: Beverlyn Roux Other Clinician: Referring Provider: Beverlyn Roux Treating Provider/Extender: Cathie Olden in Treatment: 0 Debridement Performed for Wound #8 Right,Dorsal Toe Third Assessment: Performed By: Clinician Cornell Barman, RN Debridement: Chemical/enzymatic Debridement Non-Selective Description: Pre-procedure No Verification/Time Out Taken: Procedural Pain: 0 Post Procedural Pain: 0 Response to Treatment: Procedure was tolerated well Post Debridement Measurements of Total Wound Length: (cm) 0.8 Width: (cm) 1.2 Depth: (cm) 0.1 Volume: (cm) 0.075 Character of Wound/Ulcer Post Stable Debridement: Severity of Tissue Post Debridement: Fat layer exposed Post Procedure Diagnosis Same as Pre-procedure Electronic Signature(s) Signed: 11/06/2016 4:22:06 PM By: Lawanda Cousins Signed: 11/06/2016 5:07:31 PM By: Gretta Cool, RN, BSN, Kim RN,  BSN Entered By: Gretta Cool, RN, BSN, Kim on 11/06/2016 15:51:25 [name], [name] ([id number]) -------------------------------------------------------------------------------- Debridement Details Patient Name: [name], [name] Date of Service: 11/06/2016 8:15 AM Medical Record Number: [id number] Patient Account Number: [id number] Date of Birth/Sex: 09/07/1940 (76 y.o. Female) Treating RN: Cornell Barman Primary Care Provider: Beverlyn Roux Other Clinician: Referring Provider: Beverlyn Roux Treating Provider/Extender: Cathie Olden in Treatment: 0 Debridement Performed for Wound #9 Right,Distal,Lateral Foot Assessment: Performed By: Charlesetta Garibaldi, RN Debridement: Chemical/enzymatic Debridement Non-Selective Description: Pre-procedure No Verification/Time Out Taken: Procedural Pain: 0 Post Procedural Pain: 0 Response to Treatment: Procedure was tolerated well Post Debridement Measurements of Total Wound Length: (cm) 0.5 Width: (cm) 2.5 Depth: (cm) 0.2 Volume: (cm) 0.196 Character of Wound/Ulcer Post Stable Debridement: Severity of Tissue Post Debridement: Fat layer exposed Post Procedure Diagnosis Same as Pre-procedure Electronic Signature(s) Signed: 11/06/2016 4:22:06 PM By: Lawanda Cousins Signed: 11/06/2016 5:07:31 PM By: Gretta Cool RN, BSN, Kim RN, BSN Entered By: Gretta Cool, RN, BSN, Kim on 11/06/2016 15:51:50 [name], [name] ([id number]) -------------------------------------------------------------------------------- HPI Details Patient Name: [name], [name] Date of Service: 11/06/2016 8:15 AM Medical Record Number: [id number] Patient Account Number: [id number] Date of Birth/Sex: 06-15-1941 (76 y.o. Female) Treating RN: Baruch Gouty, RN, BSN, Velva Harman Primary Care Provider: Beverlyn Roux Other Clinician: Referring Provider: Beverlyn Roux Treating Provider/Extender: Cathie Olden in Treatment: 0 History of Present Illness HPI Description: 08/13/16: This is a 76 year old woman who came  predominantly for review of 3 cm in diameter circular wound to the left anterior lateral leg. She was in the ER on 08/01/16 I reviewed their notes. There was apparently pus coming out of the wound at that time and the patient arrived requesting debridement which they don't do in the emergency room. Nevertheless I can't see that they did any x-rays. There were no cultures done. She is a type II diabetic and I a note after the patient was in the clinic that she had a bypass graft from the popliteal to the tibial on the right on 02/28/16. She also had a right greater saphenous vein harvest on the same date for arterial bypass. She is going to have vascular studies including ABIs T ABIs on the right on 08/28/16. The patient's surgery was on 02/28/16 by Dr. Vallarie Mare she had a right below the knee popliteal artery to peroneal artery bypass with reverse greater saphenous vein and an endarterectomy of the mid segment peroneal artery. Postoperatively she had a strong mild monophasic peroneal signal with a pink foot. It would appear that the patient is had some nonhealing in the surgical saphenous vein harvest site on the left leg. Surprisingly looking [facility] I cannot see much information about this at all. Dr.  Chan's notes from 05/29/16 show that the patient's wounds "are not healed" the right first metatarsal wound healed but then opened back up. 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 At that point the patient had a wound VAC to her right calf with regards to the wounds on her dorsal right toe would appear that these are felt to be arterial wounds. She has had surgery on the metatarsal phalangeal in 2015 I Dr. Doran Durand secondary to a right metatarsal phalangeal joint fracture. She is apparently had discoloration around this area since then. 08/28/16; patient arrives with her wounds in much  the same condition. The linear vein harvest site and the circular wound below it which I think was a blister. She also has to probing holes in her right great toe and a necrotic eschar on the right second toe. Because of these being arterial wounds I reduced her compression from 3-2 layers this seems to of done satisfactorily she has not had any problems. I cannot see that she is actually had an x-ray ====== 11/06/16 the patient comes in for evaluation of her right lower extremity ulcers. She was here in January 2018 for 2 visits subsequently ended up in the hospital with pneumonia and then to rehabilitation. She has now been discharged from rehabilitation and is home. She has multiple ulcerations to her right lower extremity including the foot and toes. She does have home health in place and they have been placing alginate to the ulcers. She is followed by Dr. Bridgett Larsson of vascular medicine. 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 She recently saw him on 3/23. In office ABIs were: Right 0.48 with monophasic flow to the DP, PT, peroneal Left 0.63 with monophasic flow to the DP and PT Capri, Emira V. ([id number]) Her arterial studies indicated a patent right below knee popliteal to peroneal bypass She had an MRI in February 2008 that was negative frosty myelitis but this showed general soft tissue edema in the right foot and lower extremity concerning for cellulitis She is a diabetic, managed with insulin. Her hemoglobin A1c in December 2017 was 8.4 which is a trend up from previous levels. She had blood work in February 2018 which revealed an albumin of 2.6 this appears to be relatively acute as an albumin in November 2017 was 3.7 Electronic Signature(s) Signed: 11/06/2016 11:24:49 AM By: Lawanda Cousins Entered By: Lawanda Cousins on 11/06/2016 11:24:48 [name], [name]  ([id number]) -------------------------------------------------------------------------------- Physical Exam Details Patient Name: [name], [name] Date of Service: 11/06/2016 8:15 AM Medical Record Number: [id number] Patient Account Number: [id number] Date of Birth/Sex: [birth year] (76 y.o. Female) Treating RN: Baruch Gouty, RN, BSN, Velva Harman Primary Care Provider: Beverlyn Roux Other Clinician: Referring Provider: Beverlyn Roux Treating Provider/Extender: Cathie Olden in Treatment: 0 Constitutional BP within normal limits. afebrile. well nourished; well developed; appears stated age;[name] Respiratory non-labored respiratory effort. clear to all fields. Cardiovascular S1 S2 with regular rate and rhythm. RIGHT- audible doppler signal DP, PT and peroneal. RLE- edema. Psychiatric does not appear to fully comprehend disease process. calm, pleasant, conversive. Electronic Signature(s) Signed: 11/06/2016 12:02:30 PM By: Lawanda Cousins Entered By: Lawanda Cousins on 11/06/2016 12:02:29 [name], [name] ([id number]) -------------------------------------------------------------------------------- Physician Orders Details Patient Name: [name], [name] Date of Service: 11/06/2016 8:15 AM Medical Record Number: [id number] Patient Account Number: [id number] Date of Birth/Sex: 08-07-1940 (76 y.o.  Female) Treating RN: Cornell Barman Primary Care Provider: Beverlyn Roux Other Clinician: Referring Provider: Beverlyn Roux Treating Provider/Extender: Cathie Olden in Treatment: 0 Verbal / Phone Orders: No Diagnosis Coding Anesthetic Wound #5 Right,Distal Lower Leg o Topical Lidocaine 4% cream applied to wound bed prior to debridement Wound #6 Right,Proximal Lower Leg o Topical Lidocaine 4% cream applied to wound bed prior to debridement Wound #7 Right,Dorsal Metatarsal head first o Topical Lidocaine 4% cream applied to wound bed prior to debridement Wound #8 Right,Dorsal Toe Third o Topical Lidocaine 4% cream  applied to wound bed prior to debridement Wound #9 Right,Distal,Lateral Foot o Topical Lidocaine 4% cream applied to wound bed prior to debridement Primary Wound Dressing Wound #5 Right,Distal Lower Leg o Santyl Ointment - If too expensive, use Medihoney or Manuka Honey. Wound #6 Right,Proximal Lower Leg o Santyl Ointment - If too expensive, use Medihoney or Manuka Honey. Wound #7 Right,Dorsal Metatarsal head first o Santyl Ointment - If too expensive, use Medihoney or Manuka Honey. Wound #8 Right,Dorsal Toe Third o Santyl Ointment - If too expensive, use Medihoney or Manuka Honey. Wound #9 Right,Distal,Lateral Foot o Santyl Ointment - If too expensive, use Medihoney or Manuka Honey. Secondary Dressing Wound #5 Right,Distal Lower Leg o Conform/Kerlix o Non-adherent pad [place], Jennavieve V. ([id number]) Wound #6 Right,Proximal Lower Leg o Conform/Kerlix o Non-adherent pad Wound #7 Right,Dorsal Metatarsal head first o Conform/Kerlix o Non-adherent pad Wound #8 Right,Dorsal Toe Third o Conform/Kerlix o Non-adherent pad Wound #9 Right,Distal,Lateral Foot o Conform/Kerlix o Non-adherent pad Dressing Change Frequency Wound #5 Right,Distal Lower Leg o Change dressing every day. Wound #6 Right,Proximal Lower Leg o Change dressing every day. Wound #7 Right,Dorsal Metatarsal head first o Change dressing every day. Wound #8 Right,Dorsal Toe Third o Change dressing every day. Wound #9 Right,Distal,Lateral Foot o Change dressing every day. Follow-up Appointments Wound #5 Right,Distal Lower Leg o Return Appointment in 1 week. Wound #6 Right,Proximal Lower Leg o Return Appointment in 1 week. Wound #7 Right,Dorsal Metatarsal head first o Return Appointment in 1 week. Wound #8 Right,Dorsal Toe Third o Return Appointment in 1 week. Wound #9 Right,Distal,Lateral Foot o Return Appointment in 1 week. Wynter, Loreal V. ([id number]) Edema  Control Wound #5 Right,Distal Lower Leg o Elevate legs to the level of the heart and pump ankles as often as possible Wound #6 Right,Proximal Lower Leg o Elevate legs to the level of the heart and pump ankles as often as possible Wound #7 Right,Dorsal Metatarsal head first o Elevate legs to the level of the heart and pump ankles as often as possible Wound #8 Right,Dorsal Toe Third o Elevate legs to the level of the heart and pump ankles as often as possible Wound #9 Right,Distal,Lateral Foot o Elevate legs to the level of the heart and pump ankles as often as possible Home Health Wound #5 Right,Distal Lower Leg o [place] [facility] twice weekly, [facility]-Wednesday, teach family member to change on off day. o Home Health Nurse may visit PRN to address patientos wound care needs. o FACE TO FACE ENCOUNTER: MEDICARE and MEDICAID PATIENTS: I certify that this patient is under my care and that I had a face-to-face encounter that meets the physician face-to-face encounter requirements with this patient on this date. The encounter with the patient was in whole or in part for the following MEDICAL CONDITION: (primary reason for [place]) MEDICAL NECESSITY: I certify, that based on my findings, NURSING services are a medically necessary home health  service. HOME BOUND STATUS: I certify that my clinical findings support that this patient is homebound (i.e., Due to illness or injury, pt requires aid of supportive devices such as crutches, cane, wheelchairs, walkers, the use of special transportation or the assistance of another person to leave their place of residence. There is a normal inability to leave the home and doing so requires considerable and taxing effort. Other absences are for medical reasons / religious services and are infrequent or of short duration when for other reasons). o If current dressing causes regression in wound  condition, may D/C ordered dressing product/s and apply Normal Saline Moist Dressing daily until next [place] / Other MD appointment. [place] of regression in wound condition at [phone]. o Please direct any NON-WOUND related issues/requests for orders to patient's Primary Care Physician Wound #6 Right,Proximal Lower Leg o [place] [facility] - [facility] twice weekly, [facility] Care Center-Wednesday, teach family member to change on off day. o Home Health Nurse may visit PRN to address patientos wound care needs. o FACE TO FACE ENCOUNTER: MEDICARE and MEDICAID PATIENTS: I certify that this patient is under my care and that I had a face-to-face encounter that meets the physician face-to-face encounter requirements with this patient on this date. The encounter with the patient was in whole or in part for the following MEDICAL CONDITION: (primary reason for [place]) MEDICAL NECESSITY: I certify, that based on my findings, NURSING services are a medically necessary home health service. HOME BOUND STATUS: I certify that my clinical findings Enright, Fauna V. ([id number]) support that this patient is homebound (i.e., Due to illness or injury, pt requires aid of supportive devices such as crutches, cane, wheelchairs, walkers, the use of special transportation or the assistance of another person to leave their place of residence. There is a normal inability to leave the home and doing so requires considerable and taxing effort. Other absences are for medical reasons / religious services and are infrequent or of short duration when for other reasons). o If current dressing causes regression in wound condition, may D/C ordered dressing product/s and apply Normal Saline Moist Dressing daily until next [place] / Other MD appointment. [place] of regression in wound condition at [phone]. o Please direct any  NON-WOUND related issues/requests for orders to patient's Primary Care Physician Wound #7 Right,Dorsal Metatarsal head first o [place] [facility] - [facility] twice weekly, [facility] Care Center-Wednesday, teach family member to change on off day. o Home Health Nurse may visit PRN to address patientos wound care needs. o FACE TO FACE ENCOUNTER: MEDICARE and MEDICAID PATIENTS: I certify that this patient is under my care and that I had a face-to-face encounter that meets the physician face-to-face encounter requirements with this patient on this date. The encounter with the patient was in whole or in part for the following MEDICAL CONDITION: (primary reason for [place]) MEDICAL NECESSITY: I certify, that based on my findings, NURSING services are a medically necessary home health service. HOME BOUND STATUS: I certify that my clinical findings support that this patient is homebound (i.e., Due to illness or injury, pt requires aid of supportive devices such as crutches, cane, wheelchairs, walkers, the use of special transportation or the assistance of another person to leave their place of residence. There is a normal inability to leave the home and doing so requires considerable and taxing effort. Other absences are for medical reasons / religious services and are  infrequent or of short duration when for other reasons). o If current dressing causes regression in wound condition, may D/C ordered dressing product/s and apply Normal Saline Moist Dressing daily until next [place] / Other MD appointment. [place] of regression in wound condition at [phone]. o Please direct any NON-WOUND related issues/requests for orders to patient's Primary Care Physician Wound #8 Right,Dorsal Toe [place] [facility] - [facility] twice weekly, [facility] Care Center-Wednesday, teach family member to change on off day. o Home Health Nurse may  visit PRN to address patientos wound care needs. o FACE TO FACE ENCOUNTER: MEDICARE and MEDICAID PATIENTS: I certify that this patient is under my care and that I had a face-to-face encounter that meets the physician face-to-face encounter requirements with this patient on this date. The encounter with the patient was in whole or in part for the following MEDICAL CONDITION: (primary reason for [place]) MEDICAL NECESSITY: I certify, that based on my findings, NURSING services are a medically necessary home health service. HOME BOUND STATUS: I certify that my clinical findings support that this patient is homebound (i.e., Due to illness or injury, pt requires aid of supportive devices such as crutches, cane, wheelchairs, walkers, the use of special transportation or the assistance of another person to leave their place of residence. There is a normal inability to leave the home and doing so requires considerable and taxing effort. Other absences are for medical reasons / religious services and are infrequent or of short duration when for other reasons). [name], [name] ([id number]) o If current dressing causes regression in wound condition, may D/C ordered dressing product/s and apply Normal Saline Moist Dressing daily until next [place] / Other MD appointment. [place] of regression in wound condition at [phone]. o Please direct any NON-WOUND related issues/requests for orders to patient's Primary Care Physician Wound #9 [place] [facility] - [facility] twice weekly, W[facility] Care Center-Wednesday, teach family member to change on off day. o Home Health Nurse may visit PRN to address patientos wound care needs. o FACE TO FACE ENCOUNTER: MEDICARE and MEDICAID PATIENTS: I certify that this patient is under my care and that I had a face-to-face encounter that meets the physician face-to-face encounter  requirements with this patient on this date. The encounter with the patient was in whole or in part for the following MEDICAL CONDITION: (primary reason for [place]) MEDICAL NECESSITY: I certify, that based on my findings, NURSING services are a medically necessary home health service. HOME BOUND STATUS: I certify that my clinical findings support that this patient is homebound (i.e., Due to illness or injury, pt requires aid of supportive devices such as crutches, cane, wheelchairs, walkers, the use of special transportation or the assistance of another person to leave their place of residence. There is a normal inability to leave the home and doing so requires considerable and taxing effort. Other absences are for medical reasons / religious services and are infrequent or of short duration when for other reasons). o If current dressing causes regression in wound condition, may D/C ordered dressing product/s and apply Normal Saline Moist Dressing daily until next [place] / Other MD appointment. [place] of regression in wound condition at [phone]. o Please direct any NON-WOUND related issues/requests for orders to patient's Primary Care Physician Medications-please add to medication list. Wound #5 Right,Distal Lower Leg o Santyl Enzymatic Ointment Wound #6 Right,Proximal  Lower Leg o Santyl Enzymatic Ointment Wound #7 Right,Dorsal Metatarsal head first o Santyl Enzymatic Ointment Wound #8 Right,Dorsal Toe Third o Santyl Enzymatic Ointment Wound #9 Right,Distal,Lateral Foot o Santyl Enzymatic Ointment Patient Medications Allergies: penicillin, sulfamethizole, iodine, Shellfish Containing Products, eggshell membrane Notifications Medication Indication Start End Santyl 11/06/2016 Borton, Giovanna V. ([id number]) Notifications Medication Indication Start End DOSE topical 250 unit/gram ointment - ointment topical; apply nickel  thick (approximately 16mm) amount of ointment to ulcers daily Electronic Signature(s) Signed: 11/06/2016 11:15:55 AM By: Lawanda Cousins Entered By: Lawanda Cousins on 11/06/2016 11:15:54 Mcdevitt, Preslyn V. ([id number]) -------------------------------------------------------------------------------- Problem List Details Patient Name: [name], [name] Date of Service: 11/06/2016 8:15 AM Medical Record Number: [id number] Patient Account Number: [id number] Date of Birth/Sex: [birth year] (76 y.o. 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 ([id number]) Electronic Signature(s) Signed: 11/06/2016 12:05:24 PM By: Lawanda Cousins Previous Signature: 11/06/2016 11:25:24 AM Version By: Lawanda Cousins Previous Signature: 11/06/2016 11:19:08 AM Version By: Lawanda Cousins Entered By: Lawanda Cousins on 11/06/2016 12:05:24 Roarty, Lakesa V. ([id number]) -------------------------------------------------------------------------------- Progress Note Details Patient Name: [name], Icel  V. Date of Service: 11/06/2016 8:15 AM Medical Record Number: [id number] Patient Account Number: [id number] Date of Birth/Sex: 09-13-1940 (76 y.o. Female) Treating RN: Baruch Gouty, RN, BSN, Velva Harman Primary Care Provider: Beverlyn Roux Other Clinician: Referring Provider: Beverlyn Roux Treating Provider/Extender: Cathie Olden in Treatment: 0 Subjective Chief Complaint Information obtained from Patient The patient is here for initial evaluation of RLE and Right foot/toe ulcers History of Present Illness (HPI) 08/13/16: This is a 76 year old woman who came predominantly for review of 3 cm in diameter circular wound to the left anterior lateral leg. She was in the ER on 08/01/16 I reviewed their notes. There was apparently pus coming out of the wound at that time and the patient arrived requesting debridement which they don't do in the emergency room. Nevertheless I can't see that they did any x-rays. There were no cultures done. She is a type II diabetic and I a note after the patient was in the clinic that she had a bypass graft from the popliteal to the tibial on the right on 02/28/16. She also had a right greater saphenous vein harvest on the same date for arterial bypass. She is going to have vascular studies including ABIs T ABIs on the right on 08/28/16. The patient's surgery was on 02/28/16 by Dr. Vallarie Mare she had a right below the knee popliteal artery to peroneal artery bypass with reverse greater saphenous vein and an endarterectomy of the mid segment peroneal artery. Postoperatively she had a strong mild monophasic peroneal signal with a pink foot. It would appear that the patient is had some nonhealing in the surgical saphenous vein harvest site on the left  leg. Surprisingly looking through [facility] I cannot see much information about this at all. Dr. Lucious Groves notes from 05/29/16 show that the patient's wounds "are not healed" the right first metatarsal wound healed but then opened back up.  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 At that point the patient had a wound VAC to her right calf with regards to the wounds on her dorsal right toe would appear that these are felt to be arterial wounds. She has had surgery on the metatarsal phalangeal in 2015 I Dr. Doran Durand secondary to a right metatarsal phalangeal joint fracture. She is apparently had discoloration around this area since then. 08/28/16; patient arrives with her wounds in much the same condition. The linear vein harvest site and the circular wound below it which I think was a blister. She also has to probing holes in her right great toe and a necrotic eschar on the right second toe. Because of these being arterial wounds I reduced her compression from 3-2 layers this seems to of done satisfactorily she has not had any problems. I cannot see that she is actually had an x-ray ====== 11/06/16 the patient comes in for evaluation of her right lower extremity ulcers. She was here in January 2018 for 2 visits subsequently ended up in the hospital with pneumonia and then to rehabilitation. She has now been discharged from rehabilitation and is home. She has multiple ulcerations to her right lower extremity including the foot and toes. She does have home health in place and they have been placing alginate to the Strebel, Catia V. ([id number]) ulcers. She is followed by Dr. Bridgett Larsson of vascular medicine. 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 She recently saw him on 3/23. In office ABIs were: Right 0.48 with monophasic flow to the DP, PT, peroneal Left 0.63 with monophasic flow to the DP and PT Her arterial studies indicated a patent right below knee popliteal to peroneal bypass She had an MRI in February 2008 that was negative frosty myelitis but this showed general  soft tissue edema in the right foot and lower extremity concerning for cellulitis She is a diabetic, managed with insulin. Her hemoglobin A1c in December 2017 was 8.4 which is a trend up from previous levels. She had blood work in February 2018 which revealed an albumin of 2.6 this appears to be relatively acute as an albumin in November 2017 was 3.7 Wound History Patient presents with 4 open wounds that have been present for approximately 2 years. Patient has been treating wounds in the following manner: aquacel ag. Laboratory tests have not been performed in the last month. Patient reportedly has not tested positive for an antibiotic resistant organism. Patient reportedly has had testing performed to evaluate circulation in the legs. Patient History Information obtained from Patient, Caregiver, Chart. Allergies penicillin, sulfamethizole, iodine, Shellfish Containing Products, eggshell membrane Social History Never smoker, Marital Status - Married, Alcohol Use - Never, Drug Use - No History, Caffeine Use - Daily. Medical History Endocrine Patient has history of Type II Diabetes Denies history of Type I Diabetes Patient is treated with Insulin. Blood sugar is tested. Blood sugar results noted at the following times: Breakfast - 202. Hospitalization/Surgery History - 09/16/2016, [facility], Double Pneumonia. Medical And Surgical History Notes Cardiovascular Stroke 2017 Review of Systems (ROS) Constitutional Symptoms (General Health) The  patient has no complaints or symptoms. Eyes The patient has no complaints or symptoms. Ear/Nose/Mouth/Throat The patient has no complaints or symptoms. Hematologic/Lymphatic Kamer, SHARY LAMOS. ([id number]) The patient has no complaints or symptoms. Respiratory The patient has no complaints or symptoms. Cardiovascular Complains or has symptoms of LE edema. Denies complaints or symptoms of Chest pain. Gastrointestinal The patient has no complaints  or symptoms. Endocrine The patient has no complaints or symptoms. Genitourinary The patient has no complaints or symptoms. Immunological The patient has no complaints or symptoms. Integumentary (Skin) Complains or has symptoms of Wounds, Bleeding or bruising tendency. Denies complaints or symptoms of Breakdown, Swelling. Musculoskeletal The patient has no complaints or symptoms. Neurologic The patient has no complaints or symptoms. Oncologic The patient has no complaints or symptoms. Psychiatric The patient has no complaints or symptoms. She has a past medical and surgical history to include, but not limited to: Hypertension, type 2 diabetes, hyperlipidemia, aortic stenosis, peripheral neuropathy, PAD, anemia, CVA, iron deficiency anemia, CHF, group B streptococcus sepsis (08/2016), right below the knee popliteal to peroneal bypass with reverse GSV (02/2016), left intertrochanteric femoral nailing (2015), ORIF right metatarsal fracture (2015), bilateral cataracts, left ICA stent (s/p bypass 02/2016) Objective Constitutional BP within normal limits. afebrile. well nourished; well developed; appears stated age;[name] Vitals Time Taken: 8:20 AM, Height: 64 in, Weight: 200 lbs, BMI: 34.3, Temperature: 98.0 F, Pulse: 83 bpm, Respiratory Rate: 16 breaths/min, Blood Pressure: 148/88 mmHg. Respiratory non-labored respiratory effort. clear to all fields. [name], [name] ([id number]) Cardiovascular S1 S2 with regular rate and rhythm. RIGHT- audible doppler signal DP, PT and peroneal. RLE- edema. Psychiatric does not appear to fully comprehend disease process. calm, pleasant, conversive. Integumentary (Hair, Skin) Wound #5 status is Open. Original cause of wound was Gradually Appeared. The wound is located on the Right,Distal Lower Leg. The wound measures 1.5cm length x 1.5cm width x 0.1cm depth; 1.767cm^2 area and 0.177cm^3 volume. The wound is limited to skin breakdown. There is no tunneling or  undermining noted. There is a none present amount of drainage noted. The wound margin is flat and intact. There is no granulation within the wound bed. There is a large (67-100%) amount of necrotic tissue within the wound bed including Eschar. The periwound skin appearance exhibited: Hemosiderin Staining. Wound #6 status is Open. Original cause of wound was Gradually Appeared. The wound is located on the Right,Proximal Lower Leg. The wound measures 1cm length x 0.4cm width x 0.1cm depth; 0.314cm^2 area and 0.031cm^3 volume. There is Fat Layer (Subcutaneous Tissue) Exposed exposed. There is no tunneling or undermining noted. There is a medium amount of serous drainage noted. The wound margin is flat and intact. There is no granulation within the wound bed. There is a large (67-100%) amount of necrotic tissue within the wound bed including Adherent Slough. The periwound skin appearance did not exhibit: Callus, Crepitus, Excoriation, Induration, Rash, Scarring, Dry/Scaly, Maceration, Atrophie Blanche, Cyanosis, Ecchymosis, Hemosiderin Staining, Mottled, Pallor, Rubor, Erythema. Periwound temperature was noted as No Abnormality. Wound #7 status is Open. Original cause of wound was Gradually Appeared. The wound is located on the Right,Dorsal Metatarsal head first. The wound measures 2.5cm length x 0.6cm width x 0.3cm depth; 1.178cm^2 area and 0.353cm^3 volume. There is Fat Layer (Subcutaneous Tissue) Exposed exposed. There is no tunneling or undermining noted. There is a large amount of serous drainage noted. The wound margin is flat and intact. There is no granulation within the wound bed. There is a large (67-100%) amount of necrotic tissue  within the wound bed including Eschar and Adherent Slough. The periwound skin appearance exhibited: Scarring, Maceration, Ecchymosis. Wound #8 status is Open. Original cause of wound was Gradually Appeared. The wound is located on the Right,Dorsal Toe Third. The  wound measures 0.8cm length x 1.2cm width x 0.1cm depth; 0.754cm^2 area and 0.075cm^3 volume. The wound is limited to skin breakdown. There is no tunneling or undermining noted. There is a none present amount of drainage noted. The wound margin is flat and intact. There is no granulation within the wound bed. There is a large (67-100%) amount of necrotic tissue within the wound bed including Eschar. The periwound skin appearance exhibited: Dry/Scaly. Wound #9 status is Open. Original cause of wound was Gradually Appeared. The wound is located on the Right,Distal,Lateral Foot. The wound measures 0.5cm length x 2.5cm width x 0.2cm depth; 0.982cm^2 area and 0.196cm^3 volume. The wound is limited to skin breakdown. There is no tunneling or undermining noted. There is a small amount of serous drainage noted. The wound margin is indistinct and nonvisible. There is a large (67-100%) amount of necrotic tissue within the wound bed including Adherent Slough. The periwound skin appearance exhibited: Hemosiderin Staining. The periwound skin appearance did not exhibit: Callus, Crepitus, Excoriation, Induration, Rash, Scarring, Dry/Scaly, Maceration, Atrophie Blanche, Cyanosis, Ecchymosis, Mottled, Pallor, Rubor, Erythema. 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 There was a Non-Selective Chemical/enzymatic debridement (non-viable tissue was removed) performed by Cornell Barman, RN.[name] A time out was not conducted prior to the start of the procedure. The procedure was tolerated well with a pain level of 0 throughout and a pain level of 0 following the procedure. Post Debridement Measurements: 1.5cm length x 1.5cm width x 0.1cm depth; 0.177cm^3 volume. Character of Wound/Ulcer Post Debridement is stable. Severity of Tissue Post Debridement is: Fat layer exposed. Post procedure Diagnosis Wound #5: Same as Pre-Procedure Wound #6 Wound #6 is a Diabetic Wound/Ulcer of the Lower Extremity located on the Right,Proximal Lower Leg . There was a Non-Selective Chemical/enzymatic debridement (non-viable tissue was removed) performed by Cornell Barman, RN.[name] A time out was not conducted prior to the start of the procedure. The procedure was tolerated well with a pain level of 0 throughout and a pain level of 0 following the procedure. Post Debridement Measurements: 1cm length x 0.4cm width x 0.1cm depth; 0.031cm^3 volume. Character of Wound/Ulcer Post Debridement is stable. Severity of Tissue Post Debridement is: Fat layer exposed. Post procedure Diagnosis Wound #6: Same as Pre-Procedure Goodlow, Masiah V. ([id number]) Wound #7 Wound #7 is a Diabetic Wound/Ulcer of the Lower Extremity located on the  Right,Dorsal Metatarsal head first . There was a Non-Selective Chemical/enzymatic debridement (non-viable tissue was removed) performed by Cornell Barman, RN.[name] A time out was not conducted prior to the start of the procedure. The procedure was tolerated well with a pain level of 0  throughout and a pain level of 0 following the procedure. Post Debridement Measurements: 2.5cm length x 0.6cm width x 0.3cm depth; 0.353cm^3 volume. Character of Wound/Ulcer Post Debridement is stable. Severity of Tissue Post Debridement is: Fat layer exposed. Post procedure Diagnosis Wound #7: Same as Pre-Procedure Wound #8 Wound #8 is a Diabetic Wound/Ulcer of the Lower Extremity located on the Right,Dorsal Toe Third . There was a Non-Selective Chemical/enzymatic debridement (non-viable tissue was removed) performed by Cornell Barman, RN.[name] A time out was not conducted prior to the start of the procedure. The procedure was tolerated well with a pain level of 0 throughout and a pain level of 0 following the procedure. Post Debridement Measurements: 0.8cm length x 1.2cm width x 0.1cm depth; 0.075cm^3 volume. Character of Wound/Ulcer Post Debridement is stable. Severity of Tissue Post Debridement is: Fat layer exposed. Post procedure Diagnosis Wound #8: Same as Pre-Procedure Wound #9 Wound #9 is a Diabetic Wound/Ulcer of the Lower Extremity located on the Right,Distal,Lateral Foot . There was a Non-Selective Chemical/enzymatic debridement (non-viable tissue was removed) performed by Cornell Barman, RN.[name] A time out was not conducted prior to the start of the procedure. The procedure was tolerated well with a pain level of 0 throughout and a pain level of 0 following the procedure. Post Debridement Measurements: 0.5cm length x 2.5cm width x 0.2cm depth; 0.196cm^3 volume. Character of Wound/Ulcer Post Debridement is stable. Severity of Tissue Post Debridement is: Fat layer exposed. Post procedure Diagnosis Wound #9: Same as Pre-Procedure Plan Anesthetic: Wound #5 Right,Distal Lower Leg: Topical Lidocaine 4% cream applied to wound bed prior to debridement Wound #6 Right,Proximal Lower Leg: Topical Lidocaine 4% cream applied to wound bed prior to debridement Wound #7 Right,Dorsal Metatarsal head  first: Topical Lidocaine 4% cream applied to wound bed prior to debridement Wound #8 Right,Dorsal Toe Third: Topical Lidocaine 4% cream applied to wound bed prior to debridement Petrik, Shaqueena V. ([id number]) Wound #9 Right,Distal,Lateral Foot: Topical Lidocaine 4% cream applied to wound bed prior to debridement Primary Wound Dressing: Wound #5 Right,Distal Lower Leg: Santyl Ointment - If too expensive, use Medihoney or Manuka Honey. Wound #6 Right,Proximal Lower Leg: Santyl Ointment - If too expensive, use Medihoney or Manuka Honey. Wound #7 Right,Dorsal Metatarsal head first: Santyl Ointment - If too expensive, use Medihoney or Manuka Honey. Wound #8 Right,Dorsal Toe Third: Santyl Ointment - If too expensive, use Medihoney or Manuka Honey. Wound #9 Right,Distal,Lateral Foot: Santyl Ointment - If too expensive, use Medihoney or Manuka Honey. Secondary Dressing: Wound #5 Right,Distal Lower Leg: Conform/Kerlix Non-adherent pad Wound #6 Right,Proximal Lower Leg: Conform/Kerlix Non-adherent pad Wound #7 Right,Dorsal Metatarsal head first: Conform/Kerlix Non-adherent pad Wound #8 Right,Dorsal Toe Third: Conform/Kerlix Non-adherent pad Wound #9 Right,Distal,Lateral Foot: Conform/Kerlix Non-adherent pad Dressing Change Frequency: Wound #5 Right,Distal Lower Leg: Change dressing every day. Wound #6 Right,Proximal Lower Leg: Change dressing every day. Wound #7 Right,Dorsal Metatarsal head first: Change dressing every day. Wound #8 Right,Dorsal Toe Third: Change dressing every day. Wound #9 Right,Distal,Lateral Foot: Change dressing every day. Follow-up Appointments: Wound #5 Right,Distal Lower Leg: Return Appointment in 1 week. Wound #6 Right,Proximal Lower Leg: Return Appointment in 1 week. Wound #7 Right,Dorsal Metatarsal head first: Return Appointment in 1 week. Wound #8 Right,Dorsal Toe Third: Return  Appointment in 1 week. Wound #9 Right,Distal,Lateral Foot: Return  Appointment in 1 week. Romig, Edison V. ([id number]) Edema Control: Wound #5 Right,Distal Lower Leg: Elevate legs to the level of the heart and pump ankles as often as possible Wound #6 Right,Proximal Lower Leg: Elevate legs to the level of the heart and pump ankles as often as possible Wound #7 Right,Dorsal Metatarsal head first: Elevate legs to the level of the heart and pump ankles as often as possible Wound #8 Right,Dorsal Toe Third: Elevate legs to the level of the heart and pump ankles as often as possible Wound #9 Right,Distal,Lateral Foot: Elevate legs to the level of the heart and pump ankles as often as possible Home Health: Wound #5 Right,Distal Lower Leg: Continue Home Health Visits - [facility] weekly, [facility], teach family member to change on off day. Home Health Nurse may visit PRN to address patient s wound care needs. FACE TO FACE ENCOUNTER: MEDICARE and MEDICAID PATIENTS: I certify that this patient is under my care and that I had a face-to-face encounter that meets the physician face-to-face encounter requirements with this patient on this date. The encounter with the patient was in whole or in part for the following MEDICAL CONDITION: (primary reason for [place]) MEDICAL NECESSITY: I certify, that based on my findings, NURSING services are a medically necessary home health service. HOME BOUND STATUS: I certify that my clinical findings support that this patient is homebound (i.e., Due to illness or injury, pt requires aid of supportive devices such as crutches, cane, wheelchairs, walkers, the use of special transportation or the assistance of another person to leave their place of residence. There is a normal inability to leave the home and doing so requires considerable and taxing effort. Other absences are for medical reasons / religious services and are infrequent or of short duration when for other reasons). If current dressing causes  regression in wound condition, may D/C ordered dressing product/s and apply Normal Saline Moist Dressing daily until next [place] / Other MD appointment. [place] of regression in wound condition at [phone]. Please direct any NON-WOUND related issues/requests for orders to patient's Primary Care Physician Wound #6 Right,Proximal Lower Leg: [place] Visits - [facility] twice weekly, [facility]-Wednesday, teach family member to change on off day. Home Health Nurse may visit [facility] to address patient s wound care needs. FACE TO FACE ENCOUNTER: MEDICARE and MEDICAID PATIENTS: I certify that this patient is under my care and that I had a face-to-face encounter that meets the physician face-to-face encounter requirements with this patient on this date. The encounter with the patient was in whole or in part for the following MEDICAL CONDITION: (primary reason for [place]) MEDICAL NECESSITY: I certify, that based on my findings, NURSING services are a medically necessary home health service. HOME BOUND STATUS: I certify that my clinical findings support that this patient is homebound (i.e., Due to illness or injury, pt requires aid of supportive devices such as crutches, cane, wheelchairs, walkers, the use of special transportation or the assistance of another person to leave their place of residence. There is a normal inability to leave the home and doing so requires considerable and taxing effort. Other absences are for medical reasons / religious services and are infrequent or of short duration when for other reasons). If current dressing causes regression in wound condition, may D/C ordered dressing product/s and apply Normal Saline Moist Dressing daily until next Wound Healing  Center / Other MD appointment. [place] of regression in wound condition at [phone]. Please direct any NON-WOUND related issues/requests for  orders to patient's Primary Care Physician Wound #7 Right,Dorsal Metatarsal head first: [place] [facility], [facility]-Wednesday, teach family member to change on off day. SHENOA, [place] ([id number]) Home Health Nurse may visit PRN to address patient s wound care needs. FACE TO FACE ENCOUNTER: MEDICARE and MEDICAID PATIENTS: I certify that this patient is under my care and that I had a face-to-face encounter that meets the physician face-to-face encounter requirements with this patient on this date. The encounter with the patient was in whole or in part for the following MEDICAL CONDITION: (primary reason for [place]) MEDICAL NECESSITY: I certify, that based on my findings, NURSING services are a medically necessary home health service. HOME BOUND STATUS: I certify that my clinical findings support that this patient is homebound (i.e., Due to illness or injury, pt requires aid of supportive devices such as crutches, cane, wheelchairs, walkers, the use of special transportation or the assistance of another person to leave their place of residence. There is a normal inability to leave the home and doing so requires considerable and taxing effort. Other absences are for medical reasons / religious services and are infrequent or of short duration when for other reasons). If current dressing causes regression in wound condition, may D/C ordered dressing product/s and apply Normal Saline Moist Dressing daily until next [place] / Other MD appointment. [place] of regression in wound condition at [phone]. Please direct any NON-WOUND related issues/requests for orders to patient's Primary Care Physician Wound #8 Right,Dorsal Toe Third: [place] [facility], [facility]-Wednesday, teach family member to change on off day. Home Health Nurse may visit PRN to address patient s wound  care needs. FACE TO FACE ENCOUNTER: MEDICARE and MEDICAID PATIENTS: I certify that this patient is under my care and that I had a face-to-face encounter that meets the physician face-to-face encounter requirements with this patient on this date. The encounter with the patient was in whole or in part for the following MEDICAL CONDITION: (primary reason for [place]) MEDICAL NECESSITY: I certify, that based on my findings, NURSING services are a medically necessary home health service. HOME BOUND STATUS: I certify that my clinical findings support that this patient is homebound (i.e., Due to illness or injury, pt requires aid of supportive devices such as crutches, cane, wheelchairs, walkers, the use of special transportation or the assistance of another person to leave their place of residence. There is a normal inability to leave the home and doing so requires considerable and taxing effort. Other absences are for medical reasons / religious services and are infrequent or of short duration when for other reasons). If current dressing causes regression in wound condition, may D/C ordered dressing product/s and apply Normal Saline Moist Dressing daily until next [place] / Other MD appointment. [place] of regression in wound condition at [phone]. Please direct any NON-WOUND related issues/requests for orders to patient's Primary Care Physician Wound #9 Right,Distal,Lateral Foot: [place] [facility], [facility]-Wednesday, teach family member to change on off day. Home Health Nurse may visit PRN to address patient s wound care needs. FACE TO FACE ENCOUNTER: MEDICARE and MEDICAID PATIENTS: I certify that this patient is under my care and that I had a  face-to-face encounter that meets the physician face-to-face encounter requirements with this patient on this date. The encounter with the patient was in whole or in part  for the following MEDICAL CONDITION: (primary reason for [place]) MEDICAL NECESSITY: I certify, that based on my findings, NURSING services are a medically necessary home health service. HOME BOUND STATUS: I certify that my clinical findings support that this patient is homebound (i.e., Due to illness or injury, pt requires aid of supportive devices such as crutches, cane, wheelchairs, walkers, the use of special transportation or the assistance of another person to leave their place of residence. There is a normal inability to leave the home and doing so requires considerable and taxing effort. Other absences are for medical reasons / religious services and are infrequent or of short duration when for other reasons). If current dressing causes regression in wound condition, may D/C ordered dressing product/s and apply Normal Saline Moist Dressing daily until next [place] / Other MD appointment. [place] of regression in wound condition at [phone]. Please direct any NON-WOUND related issues/requests for orders to patient's Primary Care Physician Hilario, VERBENA BOEDING ([id number]) Medications-please add to medication list.: Wound #5 Right,Distal Lower Leg: Santyl Enzymatic Ointment Wound #6 Right,Proximal Lower Leg: Santyl Enzymatic Ointment Wound #7 Right,Dorsal Metatarsal head first: Santyl Enzymatic Ointment Wound #8 Right,Dorsal Toe Third: Santyl Enzymatic Ointment Wound #9 Right,Distal,Lateral Foot: Santyl Enzymatic Ointment The following medication(s) was prescribed: Santyl topical 250 unit/gram ointment ointment topical; apply nickel thick (approximately 83mm) amount of ointment to ulcers daily starting 11/06/2016 1. Santyl to ulcerations, change daily 2. Follow-up next week in clinic Electronic Signature(s) Signed: 11/06/2016 4:27:25 PM By: Lawanda Cousins Previous Signature: 11/06/2016 12:05:40 PM Version By: Lawanda Cousins Previous Signature:  11/06/2016 12:04:14 PM Version By: Lawanda Cousins Previous Signature: 11/06/2016 11:14:00 AM Version By: Lawanda Cousins Entered By: Lawanda Cousins on 11/06/2016 16:27:24 Giusto, Ayodele [name] ([id number]) -------------------------------------------------------------------------------- ROS/PFSH Details Patient Name: Gleed, Meenakshi V. Date of Service: 11/06/2016 8:15 AM Medical Record Number: [id number] Patient Account Number: [id number] Date of Birth/Sex: 08/13/1940 (77 y.o. Female) Treating RN: Cornell Barman Primary Care Provider: Beverlyn Roux Other Clinician: Referring Provider: Beverlyn Roux Treating Provider/Extender: Cathie Olden in Treatment: 0 Information Obtained From Patient Caregiver Chart Wound History Do you currently have one or more open woundso Yes How many open wounds do you currently haveo 4 Approximately how long have you had your woundso 2 years How have you been treating your wound(s) until nowo aquacel ag Has your wound(s) ever healed and then re-openedo No Have you had any lab work done in the past montho No Have you tested positive for an antibiotic resistant organism (MRSA, VRE)o No Have you had any tests for circulation on your legso Yes Where was the test [facility] Cardiovascular Complaints and Symptoms: Positive for: LE edema Negative for: Chest pain Medical History: Positive for: Congestive Heart Failure; Hypertension Negative for: Angina; Arrhythmia; Coronary Artery Disease; Deep Vein Thrombosis; Hypotension; Myocardial Infarction; Peripheral Arterial Disease; Peripheral Venous Disease; Phlebitis Past Medical History Notes: Stroke 2017 Integumentary (Skin) Complaints and Symptoms: Positive for: Wounds; Bleeding or bruising tendency Negative for: Breakdown; Swelling Medical History: Negative for: History of Burn; History of pressure wounds Constitutional Symptoms (General Health) Complaints and Symptoms: No Complaints or Symptoms Eyes Kopec, Arryana V.  ([id number]) Complaints and Symptoms: No Complaints or Symptoms Medical History: Positive for: Cataracts - removed Negative for: Glaucoma; Optic Neuritis Ear/Nose/Mouth/Throat Complaints and Symptoms: No Complaints or Symptoms Medical History: Positive for:  Chronic sinus problems/congestion Negative for: Middle ear problems Hematologic/Lymphatic Complaints and Symptoms: No Complaints or Symptoms Medical History: Negative for: Anemia; Hemophilia; Human Immunodeficiency Virus; Lymphedema; Sickle Cell Disease Respiratory Complaints and Symptoms: No Complaints or Symptoms Medical History: Negative for: Aspiration; Asthma; Chronic Obstructive Pulmonary Disease (COPD); Pneumothorax; Sleep Apnea; Tuberculosis Gastrointestinal Complaints and Symptoms: No Complaints or Symptoms Medical History: Negative for: Cirrhosis ; Colitis; Crohnos; Hepatitis A; Hepatitis B; Hepatitis C Endocrine Complaints and Symptoms: No Complaints or Symptoms Medical History: Positive for: Type II Diabetes Negative for: Type I Diabetes Time with diabetes: 13 years Treated with: Insulin Gingras, Antonetta V. ([id number]) Blood sugar tested every day: Yes Tested : 2 Blood sugar testing results: Breakfast: 202 Genitourinary Complaints and Symptoms: No Complaints or Symptoms Medical History: Negative for: End Stage Renal Disease Immunological Complaints and Symptoms: No Complaints or Symptoms Medical History: Negative for: Lupus Erythematosus; Raynaudos; Scleroderma Musculoskeletal Complaints and Symptoms: No Complaints or Symptoms Medical History: Negative for: Gout; Rheumatoid Arthritis; Osteoarthritis; Osteomyelitis Neurologic Complaints and Symptoms: No Complaints or Symptoms Medical History: Negative for: Dementia; Neuropathy; Quadriplegia; Paraplegia; Seizure Disorder Oncologic Complaints and Symptoms: No Complaints or Symptoms Medical History: Negative for: Received Chemotherapy; Received  Radiation Psychiatric Complaints and Symptoms: No Complaints or Symptoms Medical History: Negative for: Anorexia/bulimia; Confinement Anxiety Concannon, Ruchama V. ([id number]) HBO Extended History Items Eyes: Ear/Nose/Mouth/Throat: Cataracts Chronic sinus problems/congestion Immunizations Pneumococcal Vaccine: Received Pneumococcal Vaccination: Yes Hospitalization / Surgery History Name of Hospital Purpose of Hospitalization/Surgery Date [facility]neum[facility] 09/16/2016 Family and Social History Never smoker; Marital Status - Married; Alcohol Use: Never; Drug Use: No History; Caffeine Use: Daily; Advanced Directives: No; Patient does not want information on Advanced Directives; Living Will: No; Medical Power of Attorney: No Electronic Signature(s) Signed: 11/06/2016 1:12:50 PM By: Gretta Cool RN, BSN, Kim RN, BSN Signed: 11/06/2016 4:22:06 PM By: Lawanda Cousins Entered By: Gretta Cool RN, BSN, Kim on 11/06/2016 08:42:37 Yaun, Raychel [name] ([id number]) -------------------------------------------------------------------------------- SuperBill Details Patient Name: Seel, Joaquina V. Date of Service: 11/06/2016 Medical Record Number: [id number] Patient Account Number: [id number] Date of Birth/Sex: 1940-12-05 (76 y.o. 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 diabetic peripheral angiopathy with gangrene I70.232 Atherosclerosis 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 of calf I70.235 Atherosclerosis of native  arteries of right leg with ulceration of other part of foot E43 Unspecified severe protein-calorie malnutrition Facility Procedures CPT4 Code: [id number] Description: [phone] - WOUND CARE VISIT-LEV 2 EST PT Modifier: Quantity: 1 CPT4 Code: [id number] Description: 67124 - [place] W/O ANES NON SELECT Modifier: Quantity: 1 Physician Procedures CPT4: Description Modifier Quantity Code [id number] - WC PHYS LEVEL 3 - EST PT 1 ICD-10 Description Diagnosis 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

## 2016-11-07 NOTE — Progress Notes (Signed)
[name], [name] ([id number]) Visit Report for 11/06/2016 Allergy List Details Patient Name: [name], [name] Date of Service: 11/06/2016 8:15 AM Medical Record Number: [id number] Patient Account Number: [id number] Date of Birth/Sex: 1941-07-22 (76 y.o. Female) Treating RN: Cornell Barman Primary Care Sakina Briones: Beverlyn Roux Other Clinician: Referring Neytiri Asche: Beverlyn Roux Treating Gearlene Godsil/Extender: Cathie Olden in Treatment: 0 Allergies Active Allergies penicillin sulfamethizole iodine Shellfish Containing Products eggshell membrane Allergy Notes Electronic Signature(s) Signed: 11/06/2016 1:12:50 PM By: Gretta Cool, RN, BSN, Kim RN, BSN Entered By: Gretta Cool, RN, BSN, Kim on 11/06/2016 08:56:48 [name], [name] ([id number]) -------------------------------------------------------------------------------- Arrival Information Details Patient Name: [name], [name] Date of Service: 11/06/2016 8:15 AM Medical Record Number: [id number] Patient Account Number: [id number] Date of Birth/Sex: 07-28-1941 (76 y.o. Female) Treating RN: Cornell Barman Primary Care Parilee Hally: Beverlyn Roux Other Clinician: Referring Kedrick Mcnamee: Beverlyn Roux Treating Cung Masterson/Extender: Cathie Olden in Treatment: 0 Visit Information Patient Arrived: Wheel Chair Arrival Time: 08:19 Accompanied By: husband Transfer Assistance: Manual Patient Identification Verified: Yes Secondary Verification Process Yes Completed: History Since Last Visit Added or deleted any medications: No Any new allergies or adverse reactions: No Had a fall or experienced change in activities of daily living that may affect risk of falls: No Signs or symptoms of abuse/neglect since last visito No Hospitalized since last visit: No Pain Present Now: Yes Electronic Signature(s) Signed: 11/06/2016 1:12:50 PM By: Gretta Cool, RN, BSN, Kim RN, BSN Entered By: Gretta Cool, RN, BSN, Kim on 11/06/2016 08:20:05 [name], [name]  ([id number]) -------------------------------------------------------------------------------- Clinic Level of Care Assessment Details Patient Name: [name], [name] Date of Service: 11/06/2016 8:15 AM Medical Record Number: [id number] Patient Account Number: [id number] Date of Birth/Sex: 11-25-1940 (76 y.o. Female) Treating RN: Cornell Barman Primary Care Alivea Gladson: Beverlyn Roux Other Clinician: Referring Alfredia Desanctis: Beverlyn Roux Treating Kelvon Giannini/Extender: Cathie Olden in Treatment: 0 Clinic Level of Care Assessment Items TOOL 1 Quantity Score []  - Use when EandM and Procedure is performed on INITIAL visit 0 ASSESSMENTS - Nursing Assessment / Reassessment []  - General Physical Exam (combine w/ comprehensive assessment (listed just 0 below) when performed on new pt. evals) X - Comprehensive Assessment (HX, ROS, Risk Assessments, Wounds Hx, etc.) 1 25 ASSESSMENTS - Wound and Skin Assessment / Reassessment []  - Dermatologic / Skin Assessment (not related to wound area) 0 ASSESSMENTS - Ostomy and/or Continence Assessment and Care []  - Incontinence Assessment and Management 0 []  - Ostomy Care Assessment and Management (repouching, etc.) 0 PROCESS - Coordination of Care X - Simple Patient / Family Education for ongoing care 1 15 []  - Complex (extensive) Patient / Family Education for ongoing care 0 X - Staff obtains Programmer, systems, Records, Test Results / Process Orders 1 10 []  - Staff telephones HHA, Nursing Homes / Clarify orders / etc 0 []  - Routine Transfer to another Facility (non-emergent condition) 0 []  - Routine Hospital Admission (non-emergent condition) 0 X - New Admissions / Biomedical engineer / Ordering NPWT, Apligraf, etc. 1 15 []  - Emergency Hospital Admission (emergent condition) 0 PROCESS - Special Needs []  - Pediatric / Minor Patient Management 0 []  - Isolation Patient Management 0 [place], [name] ([id number]) []  - Hearing / Language / Visual special needs 0 []  -  Assessment of Community assistance (transportation, D/C planning, etc.) 0 []  - Additional assistance / Altered mentation 0 []  - Support Surface(s) Assessment (bed, cushion, seat, etc.) 0 INTERVENTIONS - Miscellaneous []  - External ear exam 0 X - Patient Transfer (multiple staff / Civil Service fast streamer / Similar devices) 1 10 []  -  Simple Staple / Suture removal (25 or less) 0 []  - Complex Staple / Suture removal (26 or more) 0 []  - Hypo/Hyperglycemic Management (do not check if billed separately) 0 []  - Ankle / Brachial Index (ABI) - do not check if billed separately 0 Has the patient been seen at the hospital within the last three years: Yes Total Score: 75 Level Of Care: New/Established - Level 2 Electronic Signature(s) Signed: 11/06/2016 1:12:50 PM By: Gretta Cool, RN, BSN, Kim RN, BSN Entered By: Gretta Cool, RN, BSN, Kim on 11/06/2016 10:23:27 [name], [name] ([id number]) -------------------------------------------------------------------------------- Encounter Discharge Information Details Patient Name: [name], [name] Date of Service: 11/06/2016 8:15 AM Medical Record Number: [id number] Patient Account Number: [id number] Date of Birth/Sex: [birth year] (76 y.o. Female) Treating RN: Cornell Barman Primary Care Janie Capp: Beverlyn Roux Other Clinician: Referring Yulianna Folse: Beverlyn Roux Treating Laymond Postle/Extender: Cathie Olden in Treatment: 0 Encounter Discharge Information Items Discharge Pain Level: 0 Discharge Condition: Stable Ambulatory Status: Wheelchair Discharge Destination: Home Transportation: Private Auto Accompanied By: husband Schedule Follow-up Appointment: Yes Medication Reconciliation completed Yes and provided to Patient/Care [name]: Provided on Clinical Summary of Care: 11/06/2016 Form Type Recipient Paper Patient [facility] Electronic Signature(s) Signed: 11/06/2016 9:57:09 AM By: Ruthine Dose Entered By: Ruthine Dose on 11/06/2016 09:57:09 Reffett, [name]  ([id number]) -------------------------------------------------------------------------------- Lower Extremity Assessment Details Patient Name: [name], [name] Date of Service: 11/06/2016 8:15 AM Medical Record Number: [id number] Patient Account Number: [id number] Date of Birth/Sex: 05/03/1941 (76 y.o. Female) Treating RN: Cornell Barman Primary Care Amiah Frohlich: Beverlyn Roux Other Clinician: Referring Mialynn Shelvin: Beverlyn Roux Treating Tracie Lindbloom/Extender: Cathie Olden in Treatment: 0 Edema Assessment Assessed: [Left: Yes] [Right: Yes] E[Left: dema] [Right: :] Calf Left: Right: Point of Measurement: 34 cm From Medial Instep 41.5 cm 44.6 cm Ankle Left: Right: Point of Measurement: 11 cm From Medial Instep 28.5 cm 28.5 cm Vascular Assessment Claudication: Claudication Assessment [Left:None] [Right:None] Pulses: Dorsalis Pedis Palpable: [Left:No] [Right:No] Doppler Audible: [Left:Yes] [Right:Yes] Posterior Tibial Palpable: [Left:No] [Right:No] Doppler Audible: [Left:Yes] [Right:Yes] Extremity colors, hair growth, and conditions: Extremity Color: [Left:Normal] [Right:Hyperpigmented] Hair Growth on Extremity: [Left:No] [Right:No] Temperature of Extremity: [Left:Warm] [Right:Warm] Capillary Refill: [Left:< 3 seconds] [Right:< 3 seconds] Dependent Rubor: [Left:No] [Right:No] Blanched when Elevated: [Left:No] [Right:No] Lipodermatosclerosis: [Left:No] [Right:No] Blood Pressure: Brachial: [Left:200] [Right:189] Dorsalis Pedis: 96 [Left:Dorsalis Pedis: 125] Ankle: Posterior Tibial: [Left:Posterior Tibial: 0.48] [Right:0.63] Toe Nail Assessment [name], [name] ([id number]) Left: Right: Thick: Yes Yes Discolored: Yes Yes Deformed: Yes Yes Improper Length and Hygiene: Yes Yes Notes ABI results from [facility] 10/30/2016. Attached under patient scans. Electronic Signature(s) Signed: 11/06/2016 1:12:50 PM By: Gretta Cool, RN, BSN, Kim RN, BSN Entered By: Gretta Cool, RN, BSN, Kim on 11/06/2016  09:37:49 Hegner, Perla [name] ([id number]) -------------------------------------------------------------------------------- Multi Wound Chart Details Patient Name: Samek, Kahlani V. Date of Service: 11/06/2016 8:15 AM Medical Record Number: [id number] Patient Account Number: [id number] Date of Birth/Sex: October 29, 1940 (76 y.o. 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 the Lower Extremity Comorbid History: Cataracts, Chronic sinus Cataracts, Chronic sinus Cataracts, Chronic sinus problems/congestion, problems/congestion, problems/congestion, Congestive Heart Failure,  Congestive Heart Failure, Congestive Heart Failure, Hypertension, Type II Hypertension, Type II Hypertension, Type II Diabetes Diabetes Diabetes Date Acquired: 09/09/2016 09/09/2016 08/06/2013 Weeks of Treatment: 0 0 0 Wound Status: Open Open Open Measurements L x W x D 1.5x1.5x0.1 1x0.4x0.1 2.5x0.6x0.3 (cm) Area (cm) : 1.767 0.314 1.178 Volume (cm) : 0.177 0.031 0.353 Classification: Grade 1 Grade 2 Grade 2 Exudate Amount: None Present Medium Large Exudate Type: N/A Serous Serous Exudate Color: N/A amber amber Wound Margin: Flat and Intact Flat and Intact Flat and Intact Granulation Amount: None Present (0%) None Present (0%) None Present (0%) Necrotic Amount: Large (67-100%) Large (67-100%) Large  (67-100%) [name], [name] ([id number]) Necrotic Tissue: Eschar Adherent [place] Exposed Structures: Fascia: No Fat Layer (Subcutaneous Fat Layer (Subcutaneous Fat Layer (Subcutaneous Tissue) Exposed: Yes Tissue) Exposed: Yes Tissue) Exposed: No Fascia: No Fascia: No Tendon: No Tendon: No Tendon: No Muscle: No Muscle: No Muscle: No Joint: No Joint: No Joint: No Bone: No Bone: No Bone: No Limited to Skin Breakdown Epithelialization: Large (67-100%) None None Debridement: Chemical/enzymatic - Chemical/enzymatic - Chemical/enzymatic - Non-Selective Non-Selective Non-Selective Procedural Pain: 0 0 0 Post Procedural Pain: 0 0 0 Debridement Treatment Procedure was tolerated Procedure was tolerated Procedure was tolerated Response: well well well Post Debridement 1.5x1.5x0.1 1x0.4x0.1 2.5x0.6x0.3 Measurements L x W x D (cm) Post Debridement 0.177 0.031 0.353 Volume: (cm) Periwound Skin Texture: No Abnormalities Noted Excoriation: No Scarring: Yes Induration: No Callus: No Crepitus: No Rash: No Scarring: No Periwound Skin No Abnormalities Noted Maceration: No Maceration: Yes Moisture: Dry/Scaly: No Periwound Skin Color: Hemosiderin Staining: Yes Atrophie Blanche: No Ecchymosis: Yes Cyanosis: No Ecchymosis: No Erythema: No Hemosiderin Staining: No Mottled: No Pallor: No Rubor: No Temperature: N/A No Abnormality N/A Tenderness on No No No Palpation: Wound Preparation: Ulcer Cleansing: Ulcer Cleansing: Ulcer Cleansing: Rinsed/Irrigated with Rinsed/Irrigated with Rinsed/Irrigated with Saline Saline Saline Topical Anesthetic Topical Anesthetic Applied: Other: lidocaine Applied: Other: lidociane 4% 4% Procedures Performed: N/A N/A Debridement Hartzog, Audriella V. 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 the Lower Extremity Comorbid History: Cataracts, Chronic sinus Cataracts, Chronic sinus N/A problems/congestion, problems/congestion, Congestive Heart Failure, Congestive Heart Failure, Hypertension, Type II Hypertension, Type II Diabetes Diabetes Date Acquired: 10/28/2016 10/14/2016 N/A Weeks of Treatment: 0 0 N/A Wound Status: Open Open N/A Measurements L x W x D 0.8x1.2x0.1 0.5x2.5x0.2 N/A (cm) Area (cm) : 0.754 0.982 N/A Volume (cm) : 0.075 0.196 N/A Classification: Unable to visualize wound Grade 1 N/A bed Exudate Amount: None Present Small N/A Exudate Type: N/A Serous N/A Exudate Color: N/A amber N/A Wound Margin: Flat and Intact Indistinct, nonvisible N/A Granulation Amount: None Present (0%) N/A N/A Necrotic Amount: Large (67-100%) N/A N/A Necrotic Tissue: Eschar Adherent Slough N/A Exposed Structures: Fascia: No Fascia: No N/A Fat Layer (Subcutaneous Fat Layer (Subcutaneous Tissue) Exposed: No Tissue) Exposed: No Tendon: No Tendon: No Muscle: No Muscle: No Joint: No Joint: No Bone: No Bone: No Limited to Skin Limited to Skin Breakdown Breakdown Epithelialization: None Small (1-33%) N/A Debridement: Chemical/enzymatic - Chemical/enzymatic - N/A Non-Selective Non-Selective Procedural Pain: 0 0 N/A Post Procedural Pain: 0 0 N/A Debridement Treatment Procedure was tolerated Procedure was tolerated N/A Response: well well 0.8x1.2x0.1 0.5x2.5x0.2 N/A [name], [name] ([id number]) Post Debridement Measurements L x W x D (cm) Post Debridement 0.075 0.196  N/A Volume: (cm) Periwound Skin Texture: No Abnormalities Noted Excoriation: No N/A Induration: No Callus: No Crepitus: No Rash: No Scarring: No Periwound Skin Dry/Scaly: Yes Maceration: No N/A Moisture: Dry/Scaly: No Periwound Skin Color: No Abnormalities Noted Hemosiderin Staining: Yes N/A Atrophie  Blanche: No Cyanosis: No Ecchymosis: No Erythema: No Mottled: No Pallor: No Rubor: No Temperature: N/A N/A N/A Tenderness on No No N/A Palpation: Wound Preparation: Ulcer Cleansing: Ulcer Cleansing: N/A Rinsed/Irrigated with Rinsed/Irrigated with Saline Saline Topical Anesthetic Topical Anesthetic Applied: Other: lidocioane Applied: Other: lidociane 4% 4% Procedures Performed: N/A N/A N/A Treatment Notes Wound #5 (Right, Distal Lower Leg) 1. Cleansed with: Clean wound with Normal Saline 2. Anesthetic Topical Lidocaine 4% cream to wound bed prior to debridement 4. Dressing Applied: Santyl Ointment 5. Secondary Dressing Applied Kerlix/Conform Non-Adherent pad 7. Secured with Paper tape Wound #6 (Right, Proximal Lower Leg) Gittings, Laquetta V. ([id number]) 1. Cleansed with: Clean wound with Normal Saline 2. Anesthetic Topical Lidocaine 4% cream to wound bed prior to debridement 4. Dressing Applied: Santyl Ointment 5. Secondary Dressing Applied Kerlix/Conform Non-Adherent pad 7. Secured with Paper tape Wound #7 (Right, Dorsal Metatarsal head first) 1. Cleansed with: Clean wound with Normal Saline 2. Anesthetic Topical Lidocaine 4% cream to wound bed prior to debridement 4. Dressing Applied: Santyl Ointment 5. Secondary Dressing Applied Kerlix/Conform Non-Adherent pad 7. Secured with Paper tape Wound #8 (Right, Dorsal Toe Third) 1. Cleansed with: Clean wound with Normal Saline 2. Anesthetic Topical Lidocaine 4% cream to wound bed prior to debridement 4. Dressing Applied: Santyl Ointment 5. Secondary Dressing Applied Kerlix/Conform Non-Adherent pad 7. Secured with Paper tape Wound #9 (Right, Distal, Lateral Foot) 1. Cleansed with: Clean wound with Normal Saline 2. Anesthetic Topical Lidocaine 4% cream to wound bed prior to debridement 4. Dressing Applied: Santyl Ointment 5. Secondary Dressing Applied Matsumoto, Shanley V.  ([id number]) Kerlix/Conform Non-Adherent pad 7. Secured with Microbiologist) Signed: 11/06/2016 11:19:21 AM By: Lawanda Cousins Entered By: Lawanda Cousins on 11/06/2016 11:19:21 Rigaud, Drema [name] ([id number]) -------------------------------------------------------------------------------- [place] Details Patient Name: Vannatter, Amaura V. Date of Service: 11/06/2016 8:15 AM Medical Record Number: [id number] Patient Account Number: [id number] Date of Birth/Sex: 1940/12/19 (76 y.o. Female) Treating RN: Cornell Barman Primary Care Veer Elamin: Beverlyn Roux Other Clinician: Referring Ileen Kahre: Beverlyn Roux Treating Tayten Bergdoll/Extender: Cathie Olden in Treatment: 0 Active Inactive ` Abuse / Safety / Falls / Self Care Management Nursing Diagnoses: Impaired physical mobility Potential for falls Goals: Patient will remain injury free Date Initiated: 11/06/2016 Target Resolution Date: 12/30/2016 Goal Status: Active Patient/caregiver will verbalize understanding of skin care regimen Date Initiated: 11/06/2016 Target Resolution Date: 12/30/2016 Goal Status: Active Interventions: Assess fall risk on admission and as needed Treatment Activities: Patient referred to home care : 11/06/2016 Notes: ` Nutrition Nursing Diagnoses: Potential for alteratiion in Nutrition/Potential for imbalanced nutrition Goals: Patient/caregiver verbalizes understanding of need to maintain therapeutic glucose control per primary care physician Date Initiated: 11/06/2016 Target Resolution Date: 12/30/2016 Goal Status: Active Interventions: Provide education on elevated blood sugars and impact on wound healing Iden, Trinika V. ([id number]) Notes: ` Orientation to the Wound Care Program Nursing Diagnoses: Knowledge deficit related to the wound healing center program Goals: Patient/caregiver will verbalize understanding of the [place] Program Date Initiated: 11/06/2016 Target  Resolution Date: 12/30/2016 Goal Status: Active Interventions: Provide education on orientation to the wound center Notes: ` Venous Leg Ulcer Nursing Diagnoses: Actual venous Insuffiency (use after diagnosis is confirmed) Knowledge deficit related to disease process and management Goals: Non-invasive  venous studies are completed as ordered Date Initiated: 11/06/2016 Target Resolution Date: 12/30/2016 Goal Status: Active Patient/caregiver will verbalize understanding of disease process and disease management Date Initiated: 11/06/2016 Target Resolution Date: 12/30/2016 Goal Status: Active Interventions: Assess peripheral edema status every visit. Notes: ` Wound/Skin Impairment Nursing Diagnoses: Impaired tissue integrity Knowledge deficit related to smoking impact on wound healing Knowledge deficit related to ulceration/compromised skin integrity Goals: Stanzione, Mykelle V. ([id number]) Ulcer/skin breakdown will heal within 14 weeks Date Initiated: 11/06/2016 Target Resolution Date: 02/05/2017 Goal Status: Active Interventions: Assess ulceration(s) every visit Treatment Activities: Skin care regimen initiated : 11/06/2016 Notes: Electronic Signature(s) Signed: 11/06/2016 1:12:50 PM By: Gretta Cool, RN, BSN, Kim RN, BSN Entered By: Gretta Cool, RN, BSN, Kim on 11/06/2016 10:21:42 Rashad, Laquia [name] ([id number]) -------------------------------------------------------------------------------- Pain Assessment Details Patient Name: Traore, Abriel V. Date of Service: 11/06/2016 8:15 AM Medical Record Number: [id number] Patient Account Number: [id number] Date of Birth/Sex: 1941/04/28 (76 y.o. Female) Treating RN: Cornell Barman Primary Care Genesys Coggeshall: Beverlyn Roux Other Clinician: Referring Sachit Gilman: Beverlyn Roux Treating Christphor Groft/Extender: Cathie Olden in Treatment: 0 Active Problems Location of Pain Severity and Description of Pain Patient Has Paino Yes Site Locations Pain Location: Pain in  Ulcers Duration of the Pain. Constant / Intermittento Intermittent Rate the pain. Current Pain Level: 5 Worst Pain Level: 7 Least Pain Level: 2 Character of Pain Describe the Pain: Sharp, Tender, Throbbing Pain Management and Medication Current Pain Management: Goals for Pain Management Topical or injectable lidocaine is offered to patient for acute pain when surgical debridement is performed. If needed, Patient is instructed to use over the counter pain medication for the following 24-48 hours after debridement. Wound care MDs do not prescribed pain medications. Patient has chronic pain or uncontrolled pain. Patient has been instructed to make an appointment with their Primary Care Physician for pain management. Electronic Signature(s) Signed: 11/06/2016 1:12:50 PM By: Gretta Cool, RN, BSN, Kim RN, BSN Entered By: Gretta Cool, RN, BSN, Kim on 11/06/2016 08:20:49 Mcgroarty, [name] ([id number]) -------------------------------------------------------------------------------- Patient/Caregiver Education Details Patient Name: Rhinesmith, Susen V. Date of Service: 11/06/2016 8:15 AM Medical Record Number: [id number] Patient Account Number: [id number] Date of Birth/Gender: 1941-03-18 (76 y.o. Female) Treating RN: Cornell Barman Primary Care Physician: Beverlyn Roux Other Clinician: Referring Physician: Beverlyn Roux Treating Physician/Extender: Cathie Olden in Treatment: 0 Education Assessment Education Provided To: Patient Education Topics Provided Elevated Blood Sugar/ Impact on Healing: Handouts: Elevated Blood Sugars: How Do They Affect Wound Healing Methods: Demonstration Responses: State content correctly Welcome To [facility] [place]: Handouts: Welcome To [facility] [place] Methods: Demonstration Responses: State content correctly Electronic Signature(s) Signed: 11/06/2016 1:12:50 PM By: Gretta Cool, RN, BSN, Kim RN, BSN Entered By: Gretta Cool, RN, BSN, Kim on 11/06/2016 10:25:58 Kluever, [name]  ([id number]) -------------------------------------------------------------------------------- Wound Assessment Details Patient Name: Pfost, Ekaterina V. Date of Service: 11/06/2016 8:15 AM Medical Record Number: [id number] Patient Account Number: [id number] Date of Birth/Sex: Aug 13, 1940 (76 y.o. Female) Treating RN: Cornell Barman Primary Care Ronnica Dreese: Beverlyn Roux Other Clinician: Referring Markel Kurtenbach: Beverlyn Roux Treating Aritha Huckeba/Extender: Cathie Olden in Treatment: 0 Wound Status Wound Number: 5 Primary Diabetic Wound/Ulcer of the Lower Etiology: Extremity Wound Location: Right Lower Leg - Distal Wound Open Wounding Event: Gradually Appeared Status: Date Acquired: 09/09/2016 Comorbid Cataracts, Chronic sinus Weeks Of Treatment: 0 History: problems/congestion, Congestive Heart Clustered Wound: No Failure, Hypertension, Type II Diabetes Photos Wound Measurements Length: (cm) 1.5 Width: (cm) 1.5 Depth: (cm) 0.1 Area: (cm) 1.767 Volume: (cm) 0.177 % Reduction in Area: % Reduction in Volume: Epithelialization:  Large (67-100%) Tunneling: No Undermining: No Wound Description Classification: Grade 1 Foul Odor Afte Wound Margin: Flat and Intact Slough/Fibrino Exudate Amount: None Present r Cleansing: No No Wound Bed Granulation Amount: None Present (0%) Exposed Structure Necrotic Amount: Large (67-100%) Fascia Exposed: No Necrotic Quality: Eschar Fat Layer (Subcutaneous Tissue) Exposed: No Tendon Exposed: No Muscle Exposed: No Joint Exposed: No Bone Exposed: No Limited to Skin Breakdown Gaudin, Safiya V. ([id number]) Periwound Skin Texture Texture Color No Abnormalities Noted: No No Abnormalities Noted: No Hemosiderin Staining: Yes Moisture No Abnormalities Noted: No Wound Preparation Ulcer Cleansing: Rinsed/Irrigated with Saline Treatment Notes Wound #5 (Right, Distal Lower Leg) 1. Cleansed with: Clean wound with Normal Saline 2. Anesthetic Topical Lidocaine  4% cream to wound bed prior to debridement 4. Dressing Applied: Santyl Ointment 5. Secondary Dressing Applied Kerlix/Conform Non-Adherent pad 7. Secured with Microbiologist) Signed: 11/06/2016 1:12:50 PM By: Gretta Cool, RN, BSN, Kim RN, BSN Entered By: Gretta Cool, RN, BSN, Kim on 11/06/2016 09:03:12 Mantell, [name] ([id number]) -------------------------------------------------------------------------------- Wound Assessment Details Patient Name: Heying, Eleanor V. Date of Service: 11/06/2016 8:15 AM Medical Record Number: [id number] Patient Account Number: [id number] Date of Birth/Sex: Jul 24, 1941 (77 y.o. 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, Hypertension, Type II Diabetes Photos Wound Measurements Length: (cm) 1 Width: (cm) 0.4 Depth: (cm) 0.1 Area: (cm) 0.314 Volume: (cm) 0.031 % Reduction in Area: % Reduction in Volume: Epithelialization: None Tunneling: No Undermining: No Wound Description Classification: Grade 2 Wound Margin: Flat and Intact Exudate Amount: Medium Exudate Type: Serous Exudate Color: amber Foul Odor After Cleansing: No Slough/Fibrino Yes Wound Bed Granulation Amount: None Present (0%) Exposed Structure Necrotic Amount: Large (67-100%) Fascia Exposed: No Necrotic Quality: Adherent Slough Fat Layer (Subcutaneous Tissue) Exposed: Yes Tendon Exposed: No Muscle Exposed: No Joint Exposed: No Bone Exposed: No Stockman, Orrie V. ([id number]) Periwound Skin  Texture Texture Color No Abnormalities Noted: No No Abnormalities Noted: No Callus: No Atrophie Blanche: No Crepitus: No Cyanosis: No Excoriation: No Ecchymosis: No Induration: No Erythema: No Rash: No Hemosiderin Staining: No Scarring: No Mottled: No Pallor: No Moisture Rubor: No No Abnormalities Noted: No Dry / Scaly: No Temperature / Pain Maceration: No Temperature: No Abnormality Wound Preparation Ulcer Cleansing: Rinsed/Irrigated with Saline Topical Anesthetic Applied: Other: lidocaine 4%, Treatment Notes Wound #6 (Right, Proximal Lower Leg) 1. Cleansed with: Clean wound with Normal Saline 2. Anesthetic Topical Lidocaine 4% cream to wound bed prior to debridement 4. Dressing Applied: Santyl Ointment 5. Secondary Dressing Applied Kerlix/Conform Non-Adherent pad 7. Secured with Microbiologist) Signed: 11/06/2016 1:12:50 PM By: Gretta Cool, RN, BSN, Kim RN, BSN Entered By: Gretta Cool, RN, BSN, Kim on 11/06/2016 09:04:55 Haven, [name] ([id number]) -------------------------------------------------------------------------------- Wound Assessment Details Patient Name: Starn, Larrissa V. Date of Service: 11/06/2016 8:15 AM Medical Record Number: [id number] Patient Account Number: [id number] Date of Birth/Sex: March 04, 1941 (76 y.o. Female) Treating RN: Cornell Barman Primary Care Miriah Maruyama: Beverlyn Roux Other Clinician: Referring Dinita Migliaccio: Beverlyn Roux Treating Thresea Doble/Extender: Cathie Olden in Treatment: 0 Wound Status Wound Number: 7 Primary Diabetic Wound/Ulcer of the Lower Etiology: Extremity Wound Location: Right Metatarsal head first - Dorsal Wound Open Status: Wounding Event:  Gradually Appeared Comorbid Cataracts, Chronic sinus Date Acquired: 08/06/2013 History: problems/congestion, Congestive Heart Weeks Of Treatment: 0 Failure, Hypertension, Type II Diabetes Clustered Wound: No Photos Wound Measurements Length: (cm) 2.5 Width: (cm)  0.6 Depth: (cm) 0.3 Area: (cm) 1.178 Volume: (cm) 0.353 % Reduction in Area: % Reduction in Volume: Epithelialization: None Tunneling: No Undermining: No Wound Description Classification: Grade 2 Wound Margin: Flat and Intact Exudate Amount: Large Exudate Type: Serous Exudate Color: amber Foul Odor After Cleansing: No Slough/Fibrino Yes Wound Bed Granulation Amount: None Present (0%) Exposed Structure Necrotic Amount: Large (67-100%) Fascia Exposed: No Necrotic Quality: Eschar, Adherent Slough Fat Layer (Subcutaneous Tissue) Exposed: Yes Tendon Exposed: No Muscle Exposed: No Joint Exposed: No Bone Exposed: No Wigger, Lynden V. ([id number]) Periwound Skin Texture Texture Color No Abnormalities Noted: No No Abnormalities Noted: No Scarring: Yes Ecchymosis: Yes Moisture No Abnormalities Noted: No Maceration: Yes Wound Preparation Ulcer Cleansing: Rinsed/Irrigated with Saline Topical Anesthetic Applied: Other: lidociane 4%, Treatment Notes Wound #7 (Right, Dorsal Metatarsal head first) 1. Cleansed with: Clean wound with Normal Saline 2. Anesthetic Topical Lidocaine 4% cream to wound bed prior to debridement 4. Dressing Applied: Santyl Ointment 5. Secondary Dressing Applied Kerlix/Conform Non-Adherent pad 7. Secured with Microbiologist) Signed: 11/06/2016 1:12:50 PM By: Gretta Cool, RN, BSN, Kim RN, BSN Entered By: Gretta Cool, RN, BSN, Kim on 11/06/2016 09:07:12 Dearden, [name] ([id number]) -------------------------------------------------------------------------------- Wound Assessment Details Patient Name: Rivero, Akya V. Date of Service: 11/06/2016 8:15 AM Medical Record Number: [id number] Patient Account Number: [id number] Date of Birth/Sex: 17-Sep-1940 (76 y.o. 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, Hypertension, Type II Diabetes Photos Wound Measurements Length: (cm) 0.8 Width: (cm) 1.2 Depth: (cm) 0.1 Area: (cm) 0.754 Volume: (cm) 0.075 % Reduction in Area: % Reduction in Volume: Epithelialization: None Tunneling: No Undermining: No Wound Description Classification: Unable to visualize wound bed Foul Odor Afte Wound Margin: Flat and Intact Slough/Fibrino Exudate Amount: None Present r Cleansing: No No Wound Bed Granulation Amount: None Present (0%) Exposed Structure Necrotic Amount: Large (67-100%) Fascia Exposed: No Necrotic Quality: Eschar Fat Layer (Subcutaneous Tissue) Exposed: No Tendon Exposed: No Muscle Exposed: No Joint Exposed: No Bone Exposed: No Limited to Skin Breakdown Soto, Young V. ([id number]) Periwound Skin Texture Texture Color No Abnormalities Noted: No No Abnormalities Noted: No Moisture No Abnormalities Noted: No Dry / Scaly: Yes Wound Preparation Ulcer Cleansing: Rinsed/Irrigated with Saline Topical Anesthetic Applied: Other: lidocioane 4%, Treatment Notes Wound #8 (Right, Dorsal Toe Third) 1. Cleansed with: Clean wound with Normal Saline 2. Anesthetic Topical Lidocaine 4% cream to wound bed prior to debridement 4. Dressing Applied: Santyl Ointment 5. Secondary Dressing Applied Kerlix/Conform Non-Adherent pad 7. Secured with Microbiologist) Signed: 11/06/2016 1:12:50 PM By: Gretta Cool, RN, BSN, Kim RN, BSN Entered By: Gretta Cool, RN, BSN, Kim on 11/06/2016 09:08:59 Rosato, [name]  ([id number]) -------------------------------------------------------------------------------- Wound Assessment Details Patient Name: Gorey, Mireille V. Date of Service: 11/06/2016 8:15 AM Medical Record Number: [id number] Patient Account Number: [id number] Date of Birth/Sex: [birth year] (76 y.o. Female) Treating RN: Cornell Barman Primary Care Ester Hilley: Beverlyn Roux Other Clinician: Referring Sarath Privott: Beverlyn Roux Treating Shakya Sebring/Extender: Cathie Olden in  Treatment: 0 Wound Status Wound Number: 9 Primary Diabetic Wound/Ulcer of the Lower Etiology: Extremity Wound Location: Right Foot - Lateral, Distal Wound Open Wounding Event: Gradually Appeared Status: Date Acquired: 10/14/2016 Comorbid Cataracts, Chronic sinus Weeks Of Treatment: 0 History: problems/congestion, Congestive Heart Clustered Wound: No Failure, Hypertension, Type II Diabetes Photos Wound Measurements Length: (cm) 0.5 Width: (cm) 2.5 Depth: (cm) 0.2 Area: (cm) 0.982 Volume: (cm) 0.196 % Reduction in Area: % Reduction in Volume: Epithelialization: Small (1-33%) Tunneling: No Undermining: No Wound Description Classification: Grade 1 Wound Margin: Indistinct, nonvisible Exudate Amount: Small Exudate Type: Serous Exudate Color: amber Foul Odor After Cleansing: No Slough/Fibrino Yes Wound Bed Necrotic Amount: Large (67-100%) Exposed Structure Necrotic Quality: Adherent Slough Fascia Exposed: No Fat Layer (Subcutaneous Tissue) Exposed: No Tendon Exposed: No Muscle Exposed: No Joint Exposed: No Bone Exposed: No Bidwell, Shaunee V. ([id number]) Limited to Skin Breakdown Periwound Skin Texture Texture Color No Abnormalities Noted: No No Abnormalities Noted: No Callus: No Atrophie Blanche: No Crepitus: No Cyanosis: No Excoriation: No Ecchymosis: No Induration: No Erythema: No Rash: No Hemosiderin Staining: Yes Scarring: No Mottled: No Pallor: No Moisture Rubor: No No Abnormalities Noted:  No Dry / Scaly: No Maceration: No Wound Preparation Ulcer Cleansing: Rinsed/Irrigated with Saline Topical Anesthetic Applied: Other: lidociane 4%, Treatment Notes Wound #9 (Right, Distal, Lateral Foot) 1. Cleansed with: Clean wound with Normal Saline 2. Anesthetic Topical Lidocaine 4% cream to wound bed prior to debridement 4. Dressing Applied: Santyl Ointment 5. Secondary Dressing Applied Kerlix/Conform Non-Adherent pad 7. Secured with Microbiologist) Signed: 11/06/2016 1:12:50 PM By: Gretta Cool, RN, BSN, Kim RN, BSN Entered By: Gretta Cool, RN, BSN, Kim on 11/06/2016 09:10:30 Hartin, [name] ([id number]) -------------------------------------------------------------------------------- [place] Details Patient Name: Venneman, Gerry V. Date of Service: 11/06/2016 8:15 AM Medical Record Number: [id number] Patient Account Number: [id number] Date of Birth/Sex: Jul 07, 1941 (76 y.o. Female) Treating RN: Cornell Barman Primary Care Kaydan Wilhoite: Beverlyn Roux Other Clinician: Referring Siana Panameno: Beverlyn Roux Treating Karsyn Jamie/Extender: Cathie Olden in Treatment: 0 Vital Signs Time Taken: 08:20 Temperature (F): 98.0 Height (in): 64 Pulse (bpm): 83 Weight (lbs): 200 Respiratory Rate (breaths/min): 16 Body Mass Index (BMI): 34.3 Blood Pressure (mmHg): 148/88 Reference Range: 80 - 120 mg / dl Electronic Signature(s) Signed: 11/06/2016 1:12:50 PM By: Gretta Cool, RN, BSN, Kim RN, BSN Entered By: Gretta Cool, RN, BSN, Kim on 11/06/2016 09:13:41

## 2016-11-07 NOTE — Progress Notes (Signed)
[name] ([id number]) Visit Report for 11/06/2016 Abuse/Suicide Risk Screen Details Patient Name: [name], [name] Date of Service: 11/06/2016 8:15 AM Medical Record Number: [id number] Patient Account Number: [id number] Date of Birth/Sex: April 12, 1941 (76 y.o. Female) Treating RN: [name] Primary Care [name]: Beverlyn Roux Other Clinician: Referring Rabecca Birge: Beverlyn Roux Treating [name]/Extender: Cathie Olden in Treatment: 0 Abuse/Suicide Risk Screen Items Answer ABUSE/SUICIDE RISK SCREEN: Has anyone close to you tried to hurt or harm you recentlyo No Do you feel uncomfortable with anyone in your familyo No Has anyone forced you do things that you didnot want to doo No Do you have any thoughts of harming yourselfo No Patient displays signs or symptoms of abuse and/or neglect. No Electronic Signature(s) Signed: 11/06/2016 1:12:50 PM By: [name] Cool, RN, [name], [name] Entered By: [name] Cool, RN, [name], [name] on 11/06/2016 08:42:44 [name] [name] ([id number]) -------------------------------------------------------------------------------- Activities of Daily Living Details Patient Name: [name], [name] Date of Service: 11/06/2016 8:15 AM Medical Record Number: [id number] Patient Account Number: [id number] Date of Birth/Sex: 06-Nov-1940 (76 y.o. Female) Treating RN: [name] Primary Care Grizel Vesely: Beverlyn Roux Other Clinician: Referring [name]: Beverlyn Roux Treating Cosima Prentiss/Extender: Cathie Olden in Treatment: 0 Activities of Daily Living Items Answer Activities of Daily Living (Please select one for each item) Drive Automobile Not Able Take Medications Completely Able Use Telephone Completely Able Care for Appearance Completely Able Use Toilet Need Assistance Bath / Shower Completely Able Dress Self Completely Able Feed Self Completely Able Walk Need Assistance Get In / Out Bed Need Assistance Housework Completely [place] Need  Assistance Shop for Self Need Assistance Electronic Signature(s) Signed: 11/06/2016 1:12:50 PM By: [name] Cool, RN, [name], [name] Entered By: [name] Cool, RN, [name], [name] on 11/06/2016 08:43:21 [name] ([id number]) -------------------------------------------------------------------------------- Education Assessment Details Patient Name: [name], [name] Date of Service: 11/06/2016 8:15 AM Medical Record Number: [id number] Patient Account Number: [id number] Date of Birth/Sex: 1940/08/28 (76 y.o. Female) Treating RN: [name] Primary Care Jakari [name]: Beverlyn Roux Other Clinician: Referring [name]: Beverlyn Roux Treating Denzil Mceachron/Extender: Cathie Olden in Treatment: 0 Primary Learner Assessed: Patient Learning Preferences/Education Level/Primary Language Learning Preference: Explanation, Demonstration Highest Education Level: College or Above Preferred Language: English Cognitive Barrier Assessment/Beliefs Language Barrier: No Translator Needed: No Memory Deficit: No Emotional Barrier: No Cultural/Religious Beliefs Affecting Medical No Care: Physical Barrier Assessment Impaired Vision: No Impaired Hearing: Yes Decreased Hand dexterity: No Knowledge/Comprehension Assessment Knowledge Level: High Comprehension Level: High Ability to understand written High instructions: Ability to understand verbal High instructions: Motivation Assessment Anxiety Level: Calm Cooperation: Cooperative Education Importance: Acknowledges Need Interest in Health Problems: Asks Questions Perception: Coherent Willingness to Engage in Self- High Management Activities: Readiness to Engage in Self- High Management Activities: Electronic Signature(s) [name] ([id number]) Signed: 11/06/2016 1:12:50 PM By: [name] Cool, RN, [name], [name] Entered By: [name] Cool, RN, [name], [name] on 11/06/2016 08:44:02 [name] [name]  ([id number]) -------------------------------------------------------------------------------- Fall Risk Assessment Details Patient Name: [name], [name] Date of Service: 11/06/2016 8:15 AM Medical Record Number: [id number] Patient Account Number: [id number] Date of Birth/Sex: 1941-07-26 (76 y.o. Female) Treating RN: [name] Primary Care Iyanla Eilers: Beverlyn Roux Other Clinician: Referring [name]: Beverlyn Roux Treating Ayiana Winslett/Extender: Cathie Olden in Treatment: 0 Fall Risk Assessment Items Have you had 2 or more falls in the last 12 monthso 0 No Have you had any fall that resulted in injury in the last 12 monthso 0 No FALL RISK ASSESSMENT: History of falling - immediate or within  3 months 25 Yes Secondary diagnosis 0 No Ambulatory aid None/bed rest/wheelchair/nurse 0 No Crutches/cane/walker 15 Yes Furniture 0 No IV Access/Saline Lock 0 No Gait/Training Normal/bed rest/immobile 0 No Weak 10 Yes Impaired 0 No Mental Status Oriented to own ability 0 Yes Electronic Signature(s) Signed: 11/06/2016 1:12:50 PM By: [name] Cool, RN, [name], [name] Entered By: [name] Cool, RN, [name], [name] on 11/06/2016 08:44:27 [name] [name] ([id number]) -------------------------------------------------------------------------------- Foot Assessment Details Patient Name: [name], [name] Date of Service: 11/06/2016 8:15 AM Medical Record Number: [id number] Patient Account Number: [id number] Date of Birth/Sex: [birth year] (76 y.o. Female) Treating RN: [name] Primary Care Majed Pellegrin: Beverlyn Roux Other Clinician: Referring Hezzie Karim: Beverlyn Roux Treating [name]/Extender: Cathie Olden in Treatment: 0 Foot Assessment Items Site Locations + = Sensation present, - = Sensation absent, C = Callus, U = Ulcer R = Redness, W = Warmth, M = Maceration, PU = Pre-ulcerative lesion F = Fissure, S = Swelling, D = Dryness Assessment Right: Left: Other Deformity: No No Prior Foot Ulcer: No No Prior Amputation:  No No Charcot Joint: No No Ambulatory Status: Ambulatory With Help Assistance Device: Walker Gait: Administrator, arts) Signed: 11/06/2016 1:12:50 PM By: [name] Cool, RN, [name], [name] Entered By: [name] Cool, RN, [name], [name] on 11/06/2016 09:11:53 Alipio, [name] ([id number]) -------------------------------------------------------------------------------- Nutrition Risk Assessment Details Patient Name: [name], [name] Date of Service: 11/06/2016 8:15 AM Medical Record Number: [id number] Patient Account Number: [id number] Date of Birth/Sex: February 06, 1941 (76 y.o. Female) Treating RN: [name] Primary Care Khailee Mick: Beverlyn Roux Other Clinician: Referring Frederika Hukill: Beverlyn Roux Treating Lindi Abram/Extender: Cathie Olden in Treatment: 0 Height (in): 64 Weight (lbs): 200 Body Mass Index (BMI): 34.3 Nutrition Risk Assessment Items NUTRITION RISK SCREEN: I have an illness or condition that made me change the kind and/or 0 No amount of food I eat I eat fewer than two meals per day 0 No I eat few fruits and vegetables, or milk products 0 No I have three or more drinks of beer, liquor or wine almost every day 0 No I have tooth or mouth problems that make it hard for me to eat 0 No I don't always have enough money to buy the food I need 0 No I eat alone most of the time 0 No I take three or more different prescribed or over-the-counter drugs a 1 Yes day Without wanting to, I have lost or gained 10 pounds in the last six 0 No months I am not always physically able to shop, cook and/or feed myself 0 No Nutrition Protocols Good Risk Protocol 0 No interventions needed Moderate Risk Protocol Electronic Signature(s) Signed: 11/06/2016 1:12:50 PM By: [name] Cool, RN, [name], [name] Entered By: [name] Cool, RN, [name], [name] on 11/06/2016 08:44:47

## 2016-11-13 ENCOUNTER — Encounter: Payer: Medicare HMO | Admitting: Internal Medicine

## 2016-11-13 DIAGNOSIS — E11622 Type 2 diabetes mellitus with other skin ulcer: Secondary | ICD-10-CM | POA: Diagnosis not present

## 2016-11-15 NOTE — Progress Notes (Signed)
LIND, AUSLEY ([id number]) Visit Report for 11/13/2016 Chief Complaint Document Details Patient Name: White, [name] Date of Service: 11/13/2016 8:15 AM Medical Record Patient Account Number: [id number] Number: Treating RN: Baruch Gouty, RN, BSN, Velva Harman February 24, 1941 [phone] y.o. Other Clinician: Date of Birth/Sex: Female) Treating Vito Beg Primary Care Provider: Beverlyn Roux Provider/Extender: G Referring Provider: Dorthula Nettles in Treatment: 1 Information Obtained from: Patient Chief Complaint The patient is here for initial evaluation of RLE and Right foot/toe ulcers Electronic Signature(s) Signed: 11/13/2016 5:13:56 PM By: Linton Ham MD Entered By: Linton Ham on 11/13/2016 09:10:44 Spradley, Imani [name] ([id number]) -------------------------------------------------------------------------------- HPI Details Patient Name: White, [name] Date of Service: 11/13/2016 8:15 AM Medical Record Patient Account Number: [id number] Number: Treating RN: Baruch Gouty, RN, BSN, Velva Harman 04-27-1941 [phone] y.o. Other Clinician: Date of Birth/Sex: Female) Treating Calvin Chura Primary Care Provider: Beverlyn Roux Provider/Extender: G Referring Provider: Dorthula Nettles in Treatment: 1 History of Present Illness HPI Description: 08/13/16: This is a 76 year old woman who came predominantly for review of 3 cm in diameter circular wound to the left anterior lateral leg. She was in the ER on 08/01/16 I reviewed their notes. There was apparently pus coming out of the wound at that time and the patient arrived requesting debridement which they don't do in the emergency room. Nevertheless I can't see that they did any x-rays. There were no cultures done. She is a type II diabetic and I a note after the patient was in the clinic that she had a bypass graft from the popliteal to the tibial on the right on 02/28/16. She also had a right greater saphenous vein harvest on the same date for arterial  bypass. She is going to have vascular studies including ABIs T ABIs on the right on 08/28/16. The patient's surgery was on 02/28/16 by Dr. Vallarie Mare she had a right below the knee popliteal artery to peroneal artery bypass with reverse greater saphenous vein and an endarterectomy of the mid segment peroneal artery. Postoperatively she had a strong mild monophasic peroneal signal with a pink foot. It would appear that the patient is had some nonhealing in the surgical saphenous vein harvest site on the left leg. Surprisingly looking [facility] I cannot see much information about this at all. Dr. Lucious Groves notes from 05/29/16 show that the patient's wounds "are not healed" the right first metatarsal wound healed but then opened back up. 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 At that point the patient had a wound VAC to her right calf with regards to the wounds on her dorsal right toe would appear that these are felt to be arterial wounds. She has had surgery on the metatarsal phalangeal in 2015 I Dr. Doran Durand secondary to a right metatarsal phalangeal joint fracture. She is apparently had discoloration around this area since then. 08/28/16; patient arrives with her wounds in much the same condition. The linear vein harvest site and the circular wound below it which I think was a blister. She also has to probing holes in her right great toe and a necrotic eschar on the right second toe. Because of these being arterial wounds I reduced her compression from 3-2 layers this seems to of done satisfactorily she has not had any problems. I cannot see that she is actually had an x-ray ====== 11/06/16 the patient comes in for evaluation of her right lower extremity ulcers. She was  here in January 2018 for 2 visits subsequently ended up in the hospital with pneumonia and then to rehabilitation. She has  now been discharged from rehabilitation and is home. She has multiple ulcerations to her right lower extremity including the foot and toes. She does have home health in place and they have been placing alginate to the ulcers. She is followed by Dr. Bridgett Larsson of vascular medicine. 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 She recently saw him on 3/23. In office ABIs were: Delancy, SOPHIAMARIE NEASE. ([id number]) Right 0.48 with monophasic flow to the DP, PT, peroneal Left 0.63 with monophasic flow to the DP and PT Her arterial studies indicated a patent right below knee popliteal to peroneal bypass She had an MRI in February 2008 that was negative frosty myelitis but this showed general soft tissue edema in the right foot and lower extremity concerning for cellulitis She is a diabetic, managed with insulin. Her hemoglobin A1c in December 2017 was 8.4 which is a trend up from previous levels. She had blood work in February 2018 which revealed an albumin of 2.6 this appears to be relatively acute as an albumin in November 2017 was 3.7 11/13/16; this is a patient I have not seen since February who is readmitted to our clinic last week. 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 Bridgett Larsson. I have reviewed Dr. Lianne Moris notes from March/23/18. Doppler ABI on that date showed an ABI on the right of 0.48 and on the left of 0.63. Dorsalis pedis waveforms were monophasic bilaterally. There was no waveforms detected at the posterior tibial on the right, monophasic on the left. Dr. Lianne Moris comments were that this patient would have follow-up vascular studies in 3 months including ABIs and right lower extremity arterial duplex. She had an MRI in February that was negative for osteomyelitis but showed generalized edema in the foot. Last albumin I see was in January at 3.4 we have been using Santyl  to the 4 wounds on the right leg the patient is noted today to have widespread edema well up towards her groin this is pitting 2-3+. 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 Normal ejection fraction. Electronic Signature(s) Signed: 11/13/2016 5:13:56 PM By: Linton Ham MD Entered By: Linton Ham on 11/13/2016 09:13:56 Dupin, [name] ([id number]) -------------------------------------------------------------------------------- Physical Exam Details Patient Name: Kuc, Aisley V. Date of Service: 11/13/2016 8:15 AM Medical Record Patient Account Number: [id number] Number: Treating RN: Baruch Gouty, RN, BSN, Velva Harman 1940-12-21 [phone] y.o. Other Clinician: Date of Birth/Sex: Female) Treating Davian Wollenberg Primary Care Provider: Beverlyn Roux Provider/Extender: G Referring Provider: Dorthula Nettles in Treatment: 1 Constitutional Sitting or standing Blood Pressure is within target range for patient.. Pulse regular and within target range for patient.[name] Respirations regular, non-labored and within target range.. Temperature is normal and within the target range for the patient.. Patient's appearance is neat and clean. Appears in no acute distress. Well nourished and well developed.. Eyes Conjunctivae clear. No discharge.. Ears, Nose, Mouth, and Throat Oropharynx within normal limits, without erythema, exudate or ulceration.[name] Respiratory Respiratory effort is easy and symmetric bilaterally. Rate is normal at rest and on room air.. Shallow with otherwise clear air entry. Cardiovascular 2/6 midsystolic murmur maximal at the aortic area. Does not radiate into the carotids no elevation of jugular venous pressure  no coccyx edema. Faintly palpable. Not palpable at the popliteal. Pedal pulses absent bilaterally.. Edema present in both extremities. This is equal bilaterally well up into her upper thighs. Gastrointestinal  (GI) Abdomen is soft and non-distended without masses or tenderness. Bowel sounds active in all quadrants.. Genitourinary (GU) Bladder without fullness, masses or tenderness.. Lymphatic None palpable in the popliteal or inguinal area. Integumentary (Hair, Skin) No rashes noted. Psychiatric No evidence of depression, anxiety, or agitation. Calm, cooperative, and communicative. Appropriate interactions and affect.. Notes Wound exam; she has 4 wounds on the right leg oSmall open area on her previous saphenous vein harvest site oSmall distal wound in the right calf oSmall open area in the dorsal, proximal right great toe oSmall open area on the dorsal right third toe none of these appear to be infected Coor, Abbygayle V. ([id number]) Electronic Signature(s) Signed: 11/13/2016 5:13:56 PM By: Linton Ham MD Entered By: Linton Ham on 11/13/2016 09:16:17 Maddison, [name] ([id number]) -------------------------------------------------------------------------------- Physician Orders Details Patient Name: Venables, Roya V. Date of Service: 11/13/2016 8:15 AM Medical Record Patient Account Number: [id number] Number: Treating RN: Baruch Gouty, RN, BSN, Velva Harman 01-28-1941 [phone] y.o. Other Clinician: Date of Birth/Sex: Female) Treating Fallan Mccarey Primary Care Provider: Beverlyn Roux Provider/Extender: G Referring Provider: Dorthula Nettles in Treatment: 1 Verbal / Phone Orders: No Diagnosis Coding Anesthetic Wound #5 Right,Distal Lower Leg o Topical Lidocaine 4% cream applied to wound bed prior to debridement Wound #6 Right,Proximal Lower Leg o Topical Lidocaine 4% cream applied to wound bed prior to debridement Wound #7 Right,Dorsal Metatarsal head first o Topical Lidocaine 4% cream applied to wound bed prior to debridement Wound #8 Right,Dorsal Toe Third o Topical Lidocaine 4% cream applied to wound bed prior to debridement Wound #9 Right,Distal,Lateral Foot o Topical  Lidocaine 4% cream applied to wound bed prior to debridement Primary Wound Dressing Wound #5 Right,Distal Lower Leg o Santyl Ointment - If too expensive, use Medihoney or Manuka Honey. Wound #6 Right,Proximal Lower Leg o Santyl Ointment - If too expensive, use Medihoney or Manuka Honey. Wound #7 Right,Dorsal Metatarsal head first o Santyl Ointment - If too expensive, use Medihoney or Manuka Honey. Wound #8 Right,Dorsal Toe Third o Santyl Ointment - If too expensive, use Medihoney or Manuka Honey. Wound #9 Right,Distal,Lateral Foot o Santyl Ointment - If too expensive, use Medihoney or Manuka Honey. Secondary Dressing Wound #5 Right,Distal Lower Leg o Conform/Kerlix Somma, Lacye V. ([id number]) o Non-adherent pad Wound #6 Right,Proximal Lower Leg o Conform/Kerlix o Non-adherent pad Wound #7 Right,Dorsal Metatarsal head first o Conform/Kerlix o Non-adherent pad Wound #8 Right,Dorsal Toe Third o Conform/Kerlix o Non-adherent pad Wound #9 Right,Distal,Lateral Foot o Conform/Kerlix o Non-adherent pad Dressing Change Frequency Wound #5 Right,Distal Lower Leg o Change dressing every day. Wound #6 Right,Proximal Lower Leg o Change dressing every day. Wound #7 Right,Dorsal Metatarsal head first o Change dressing every day. Wound #8 Right,Dorsal Toe Third o Change dressing every day. Wound #9 Right,Distal,Lateral Foot o Change dressing every day. Follow-up Appointments Wound #5 Right,Distal Lower Leg o Return Appointment in 1 week. Wound #6 Right,Proximal Lower Leg o Return Appointment in 1 week. Wound #7 Right,Dorsal Metatarsal head first o Return Appointment in 1 week. Wound #8 Right,Dorsal Toe Third o Return Appointment in 1 week. Wound #9 Right,Distal,Lateral Foot o Return Appointment in 1 week. Limberg, Tasheba V. ([id number]) Edema Control Wound #5 Right,Distal Lower Leg o Elevate legs to the level of the heart and pump  ankles as often as possible Wound #6 Right,Proximal  Lower Leg o Elevate legs to the level of the heart and pump ankles as often as possible Wound #7 Right,Dorsal Metatarsal head first o Elevate legs to the level of the heart and pump ankles as often as possible Wound #8 Right,Dorsal Toe Third o Elevate legs to the level of the heart and pump ankles as often as possible Wound #9 Right,Distal,Lateral Foot o Elevate legs to the level of the heart and pump ankles as often as possible Home Health Wound #5 Right,Distal Lower Leg o [place] [facility] - [facility] twice weekly, [facility]-Wednesday, teach family member to change on off day. o Home Health Nurse may visit PRN to address patientos wound care needs. o FACE TO FACE ENCOUNTER: MEDICARE and MEDICAID PATIENTS: I certify that this patient is under my care and that I had a face-to-face encounter that meets the physician face-to-face encounter requirements with this patient on this date. The encounter with the patient was in whole or in part for the following MEDICAL CONDITION: (primary reason for [place]) MEDICAL NECESSITY: I certify, that based on my findings, NURSING services are a medically necessary home health service. HOME BOUND STATUS: I certify that my clinical findings support that this patient is homebound (i.e., Due to illness or injury, pt requires aid of supportive devices such as crutches, cane, wheelchairs, walkers, the use of special transportation or the assistance of another person to leave their place of residence. There is a normal inability to leave the home and doing so requires considerable and taxing effort. Other absences are for medical reasons / religious services and are infrequent or of short duration when for other reasons). o If current dressing causes regression in wound condition, may D/C ordered dressing product/s and apply Normal Saline Moist Dressing daily until next  [place] / Other MD appointment. [place] of regression in wound condition at [phone]. o Please direct any NON-WOUND related issues/requests for orders to patient's Primary Care Physician Wound #6 Right,Proximal Lower Leg o [place] Visits - [facility] twice weekly, [facility] Care Center-Wednesday, teach family member to change on off day. o Home Health Nurse may visit PRN to address patientos wound care needs. o FACE TO FACE ENCOUNTER: MEDICARE and MEDICAID PATIENTS: I certify that this patient is under my care and that I had a face-to-face encounter that meets the physician face-to-face encounter requirements with this patient on this date. The encounter with the patient was in whole or in part for the following MEDICAL CONDITION: (primary reason for [place]) MEDICAL NECESSITY: I certify, that based on my findings, NURSING services are a medically Philley, Albena V. ([id number]) necessary home health service. HOME BOUND STATUS: I certify that my clinical findings support that this patient is homebound (i.e., Due to illness or injury, pt requires aid of supportive devices such as crutches, cane, wheelchairs, walkers, the use of special transportation or the assistance of another person to leave their place of residence. There is a normal inability to leave the home and doing so requires considerable and taxing effort. Other absences are for medical reasons / religious services and are infrequent or of short duration when for other reasons). o If current dressing causes regression in wound condition, may D/C ordered dressing product/s and apply Normal Saline Moist Dressing daily until next [place] / Other MD appointment. [place] of regression in wound condition at [phone]. o Please direct any NON-WOUND related issues/requests for orders to patient's Primary  Care Physician Wound #7 Right,Dorsal  Metatarsal head first o [place] Visits - [facility] twice weekly, [facility] Care Center-Wednesday, teach family member to change on off day. o Home Health Nurse may visit PRN to address patientos wound care needs. o FACE TO FACE ENCOUNTER: MEDICARE and MEDICAID PATIENTS: I certify that this patient is under my care and that I had a face-to-face encounter that meets the physician face-to-face encounter requirements with this patient on this date. The encounter with the patient was in whole or in part for the following MEDICAL CONDITION: (primary reason for [place]) MEDICAL NECESSITY: I certify, that based on my findings, NURSING services are a medically necessary home health service. HOME BOUND STATUS: I certify that my clinical findings support that this patient is homebound (i.e., Due to illness or injury, pt requires aid of supportive devices such as crutches, cane, wheelchairs, walkers, the use of special transportation or the assistance of another person to leave their place of residence. There is a normal inability to leave the home and doing so requires considerable and taxing effort. Other absences are for medical reasons / religious services and are infrequent or of short duration when for other reasons). o If current dressing causes regression in wound condition, may D/C ordered dressing product/s and apply Normal Saline Moist Dressing daily until next [place] / Other MD appointment. [place] of regression in wound condition at [phone]. o Please direct any NON-WOUND related issues/requests for orders to patient's Primary Care Physician Wound #8 Right,Dorsal Toe [place] Visits - [facility] twice weekly, [facility] Care [facility]-Wednesday, teach family member to change on off day. o Home Health Nurse may visit PRN to address patientos wound care needs. o FACE TO FACE ENCOUNTER: MEDICARE and MEDICAID  PATIENTS: I certify that this patient is under my care and that I had a face-to-face encounter that meets the physician face-to-face encounter requirements with this patient on this date. The encounter with the patient was in whole or in part for the following MEDICAL CONDITION: (primary reason for [place]) MEDICAL NECESSITY: I certify, that based on my findings, NURSING services are a medically necessary home health service. HOME BOUND STATUS: I certify that my clinical findings support that this patient is homebound (i.e., Due to illness or injury, pt requires aid of supportive devices such as crutches, cane, wheelchairs, walkers, the use of special transportation or the assistance of another person to leave their place of residence. There is a normal inability to leave the home and doing so requires considerable and taxing effort. Other [place]ia, Lafaye V. ([id number]) absences are for medical reasons / religious services and are infrequent or of short duration when for other reasons). o If current dressing causes regression in wound condition, may D/C ordered dressing product/s and apply Normal Saline Moist Dressing daily until next [place] / Other MD appointment. [place] of regression in wound condition at [phone]. o Please direct any NON-WOUND related issues/requests for orders to patient's Primary Care Physician Wound #9 [place] Visits - [facility] twice weekly, [facility] Care Center-Wednesday, teach family member to change on off day. o Home Health Nurse may visit PRN to address patientos wound care needs. o FACE TO FACE ENCOUNTER: MEDICARE and MEDICAID PATIENTS: I certify that this patient is under my care and that I had a face-to-face encounter that meets the physician face-to-face encounter requirements with this patient on this date. The encounter  with the patient was in whole or in part for  the following MEDICAL CONDITION: (primary reason for [place]) MEDICAL NECESSITY: I certify, that based on my findings, NURSING services are a medically necessary home health service. HOME BOUND STATUS: I certify that my clinical findings support that this patient is homebound (i.e., Due to illness or injury, pt requires aid of supportive devices such as crutches, cane, wheelchairs, walkers, the use of special transportation or the assistance of another person to leave their place of residence. There is a normal inability to leave the home and doing so requires considerable and taxing effort. Other absences are for medical reasons / religious services and are infrequent or of short duration when for other reasons). o If current dressing causes regression in wound condition, may D/C ordered dressing product/s and apply Normal Saline Moist Dressing daily until next [place] / Other MD appointment. [place] of regression in wound condition at [phone]. o Please direct any NON-WOUND related issues/requests for orders to patient's Primary Care Physician Medications-please add to medication list. Wound #5 Right,Distal Lower Leg o Santyl Enzymatic Ointment Wound #6 Right,Proximal Lower Leg o Santyl Enzymatic Ointment Wound #7 Right,Dorsal Metatarsal head first o Santyl Enzymatic Ointment Wound #8 Right,Dorsal Toe Third o Santyl Enzymatic Ointment Wound #9 Right,Distal,Lateral Foot o Santyl Enzymatic Ointment Electronic Signature(s) Signed: 11/13/2016 5:13:56 PM By: Linton Ham MD CREEDENCE, HEISS ([id number]) Signed: 11/13/2016 5:19:42 PM By: Regan Lemming BSN, RN Entered By: Regan Lemming on 11/13/2016 09:01:09 Kinnard, Dustine V. ([id number]) -------------------------------------------------------------------------------- Problem List Details Patient Name: Mehta, Lynley V. Date of Service: 11/13/2016 8:15 AM Medical Record Patient Account  Number: [id number] Number: Treating RN: Baruch Gouty, RN, BSN, Velva Harman 08/17/1940 [phone] y.o. 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 ([id number]) -------------------------------------------------------------------------------- Progress Note Details Patient Name: Koble, Jeanna V. Date of Service: 11/13/2016 8:15 AM Medical Record Patient Account Number: [id number] Number: Treating RN: Baruch Gouty, RN, BSN, Velva Harman June 04, 1941 [phone] y.o. Other Clinician: Date of Birth/Sex: Female) Treating Laurrie Toppin Primary Care Provider: Beverlyn Roux Provider/Extender: G Referring Provider: Dorthula Nettles in Treatment: 1 Subjective Chief Complaint Information obtained from Patient The patient is here for initial evaluation of RLE and Right foot/toe ulcers History of Present Illness (HPI) 08/13/16: This is  a 76 year old woman who came predominantly for review of 3 cm in diameter circular wound to the left anterior lateral leg. She was in the ER on 08/01/16 I reviewed their notes. There was apparently pus coming out of the wound at that time and the patient arrived requesting debridement which they don't do in the emergency  room. Nevertheless I can't see that they did any x-rays. There were no cultures done. She is a type II diabetic and I a note after the patient was in the clinic that she had a bypass graft from the popliteal to the tibial on the right on 02/28/16. She also had a right greater saphenous vein harvest on the same date for arterial bypass. She is going to have vascular studies including ABIs T ABIs on the right on 08/28/16. The patient's surgery was on 02/28/16 by Dr. Vallarie Mare she had a right below the knee popliteal artery to peroneal artery bypass with reverse greater saphenous vein and an endarterectomy of the mid segment peroneal artery. Postoperatively she had a strong mild monophasic peroneal signal with a pink foot. It would appear that the patient is had some nonhealing in the surgical saphenous vein harvest site on the left leg. Surprisingly looking through [facility] I cannot see much information about this at all. Dr. Lucious Groves notes from 05/29/16 show that the patient's wounds "are not healed" the right first metatarsal wound healed but then opened back up. 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 At that point the patient had a wound VAC to her right calf with regards to the wounds on her dorsal right toe would appear that these are felt to be arterial wounds. She has had surgery on the metatarsal phalangeal in 2015 I Dr. Doran Durand secondary to a right metatarsal phalangeal joint fracture. She is apparently had discoloration around this area since then. 08/28/16; patient  arrives with her wounds in much the same condition. The linear vein harvest site and the circular wound below it which I think was a blister. She also has to probing holes in her right great toe and a necrotic eschar on the right second toe. Because of these being arterial wounds I reduced her compression from 3-2 layers this seems to of done satisfactorily she has not had any problems. I cannot see that she is actually had an x-ray ====== 11/06/16 the patient comes in for evaluation of her right lower extremity ulcers. She was here in January 2018 for 2 visits subsequently ended up in the hospital with pneumonia and then to rehabilitation. She has now been [place], [place] ([id number]) discharged from rehabilitation and is home. She has multiple ulcerations to her right lower extremity including the foot and toes. She does have home health in place and they have been placing alginate to the ulcers. She is followed by Dr. Bridgett Larsson of vascular medicine. 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 She recently saw him on 3/23. In office ABIs were: Right 0.48 with monophasic flow to the DP, PT, peroneal Left 0.63 with monophasic flow to the DP and PT Her arterial studies indicated a patent right below knee popliteal to peroneal bypass She had an MRI in February 2008 that was negative frosty myelitis but this showed general soft tissue edema in the right foot and lower extremity concerning for cellulitis She is a diabetic, managed with insulin. Her hemoglobin A1c in December 2017 was 8.4 which is a trend up from previous levels. She had blood work in February 2018 which revealed an albumin of 2.6 this appears to be relatively acute as an albumin in November 2017 was 3.7 11/13/16; this is a patient I have not seen since  February who is readmitted to our clinic last week. 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 Bridgett Larsson. I have reviewed Dr. Lianne Moris notes from March/23/18. Doppler ABI on that date showed an ABI on the right of 0.48 and on the left of 0.63. Dorsalis pedis waveforms were monophasic bilaterally. There was no waveforms detected at the posterior tibial on the right, monophasic on the left. Dr. Lianne Moris comments were that this patient would have follow-up vascular studies in 3 months including ABIs and right lower extremity arterial duplex. She had an MRI in February that was negative for osteomyelitis but showed generalized edema in the foot. Last albumin I see was in January at 3.4 we have been using Santyl to the 4 wounds on the right leg the patient is noted today to have widespread edema well up towards her groin this is pitting 2-3+. 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 Normal ejection fraction. Objective Constitutional Sitting or standing Blood Pressure is within target range for patient.. Pulse regular and within target range for patient.[name] Respirations regular, non-labored and within target range.. Temperature is normal and within the target range for the patient.. Patient's appearance is neat and clean. Appears in no acute distress. Well nourished and well developed.. Vitals Time Taken: 8:25 AM, Height: 64 in, Weight: 200 lbs, BMI: 34.3, Temperature: 98.1 F, Pulse: 74 bpm, Respiratory Rate: 17 breaths/min, Blood Pressure: 141/55 mmHg. Eyes Conjunctivae clear. No discharge.. Ears, Nose, Mouth, and Throat Oropharynx within normal limits, without erythema, exudate or ulceration.. Beaudry, Laila VMarland Kitchen ([id number]) Respiratory Respiratory effort is easy and symmetric bilaterally. Rate is normal at rest and on room air.. Shallow with otherwise clear air entry. Cardiovascular 2/6 midsystolic murmur maximal at the aortic area. Does not radiate into the carotids no elevation of  jugular venous pressure no coccyx edema. Faintly palpable. Not palpable at the popliteal. Pedal pulses absent bilaterally.. Edema present in both extremities. This is equal bilaterally well up into her upper thighs. Gastrointestinal (GI) Abdomen is soft and non-distended without masses or tenderness. Bowel sounds active in all quadrants.. Genitourinary (GU) Bladder without fullness, masses or tenderness.. Lymphatic None palpable in the popliteal or inguinal area. Psychiatric No evidence of depression, anxiety, or agitation. Calm, cooperative, and communicative. Appropriate interactions and affect.. General Notes: Wound exam; she has 4 wounds on the right leg Small open area on her previous saphenous vein harvest site Small distal wound in the right calf Small open area in the dorsal, proximal right great toe Small open area on the dorsal right third toe none of these appear to be infected Integumentary (Hair, Skin) No rashes noted. Wound #5 status is Open. Original cause of wound was Gradually Appeared. The wound is located on the Right,Distal Lower Leg. The wound measures 0.2cm length x 0.2cm width x 0.1cm depth; 0.031cm^2 area and 0.003cm^3 volume. The wound is limited to skin breakdown. There is no tunneling or undermining noted. There is a none present amount of drainage noted. The wound margin is flat and intact. There is large (67-100%) pink, friable granulation within the wound bed. There is no necrotic tissue within the wound bed. The periwound skin appearance exhibited: Hemosiderin Staining. The periwound skin appearance did not exhibit: Callus, Crepitus, Excoriation, Induration, Rash, Scarring, Dry/Scaly, Maceration, Atrophie Blanche, Cyanosis, Ecchymosis, Mottled, Pallor, Rubor, Erythema. Periwound temperature was noted as No Abnormality. The periwound has tenderness on palpation.  Wound #6 status is Open. Original cause of wound was Gradually Appeared. The wound is located on  the Right,Proximal Lower Leg. The wound measures 1cm length x 0.3cm width x 0.1cm depth; 0.236cm^2 area and 0.024cm^3 volume. There is Fat Layer (Subcutaneous Tissue) Exposed exposed. There is no tunneling or undermining noted. There is a medium amount of serous drainage noted. The wound margin is flat and intact. There is no granulation within the wound bed. There is a large (67-100%) amount of necrotic tissue within the wound bed including Adherent Slough. The periwound skin appearance did not exhibit: Callus, Crepitus, Excoriation, Induration, Rash, Scarring, Dry/Scaly, Maceration, Atrophie Blanche, Cyanosis, Ecchymosis, Hemosiderin Staining, Mottled, Pallor, Rubor, Erythema. Periwound temperature was noted as No Abnormality. The periwound has tenderness on palpation. Wound #7 status is Open. Original cause of wound was Gradually Appeared. The wound is located on the [place], Wavie V. ([id number]) Right,Dorsal Metatarsal head first. The wound measures 2cm length x 1cm width x 0.4cm depth; 1.571cm^2 area and 0.628cm^3 volume. There is Fat Layer (Subcutaneous Tissue) Exposed exposed. There is no tunneling or undermining noted. There is a large amount of serous drainage noted. The wound margin is flat and intact. There is no granulation within the wound bed. There is a large (67-100%) amount of necrotic tissue within the wound bed including Eschar and Adherent Slough. The periwound skin appearance exhibited: Scarring, Maceration, Ecchymosis. Periwound temperature was noted as No Abnormality. The periwound has tenderness on palpation. Wound #8 status is Open. Original cause of wound was Gradually Appeared. The wound is located on the Right,Dorsal Toe Third. The wound measures 0.8cm length x 1cm width x 0.1cm depth; 0.628cm^2 area and 0.063cm^3 volume. The wound is limited to skin breakdown. There is no tunneling or undermining noted. There is a none present amount of drainage noted. The wound margin  is flat and intact. There is no granulation within the wound bed. There is a large (67-100%) amount of necrotic tissue within the wound bed including Eschar. The periwound skin appearance exhibited: Dry/Scaly. Periwound temperature was noted as No Abnormality. The periwound has tenderness on palpation. Wound #9 status is Open. Original cause of wound was Gradually Appeared. The wound is located on the Right,Distal,Lateral Foot. The wound measures 0.5cm length x 2.3cm width x 0.2cm depth; 0.903cm^2 area and 0.181cm^3 volume. There is Fat Layer (Subcutaneous Tissue) Exposed exposed. There is no tunneling or undermining noted. There is a small amount of serous drainage noted. The wound margin is indistinct and nonvisible. There is no granulation within the wound bed. There is a large (67-100%) amount of necrotic tissue within the wound bed including Adherent Slough. The periwound skin appearance exhibited: Hemosiderin Staining. The periwound skin appearance did not exhibit: Callus, Crepitus, Excoriation, Induration, Rash, Scarring, Dry/Scaly, Maceration, Atrophie Blanche, Cyanosis, Ecchymosis, Mottled, Pallor, Rubor, Erythema. Periwound temperature was noted as No Abnormality. The periwound has tenderness on palpation. 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 ([id number]) Anesthetic: Wound #5  Right,Distal  Lower Leg: Topical Lidocaine 4% cream applied to wound bed prior to debridement Wound #6 Right,Proximal Lower Leg: Topical Lidocaine 4% cream applied to wound bed prior to debridement Wound #7 Right,Dorsal Metatarsal head first: Topical Lidocaine 4% cream applied to wound bed prior to debridement Wound #8 Right,Dorsal Toe Third: Topical Lidocaine 4% cream applied to wound bed prior to debridement Wound #9 Right,Distal,Lateral Foot: Topical Lidocaine 4% cream applied to wound bed prior to debridement Primary Wound Dressing: Wound #5 Right,Distal Lower Leg: Santyl Ointment - If too expensive, use Medihoney or Manuka Honey. Wound #6 Right,Proximal Lower Leg: Santyl Ointment - If too expensive, use Medihoney or Manuka Honey. Wound #7 Right,Dorsal Metatarsal head first: Santyl Ointment - If too expensive, use Medihoney or Manuka Honey. Wound #8 Right,Dorsal Toe Third: Santyl Ointment - If too expensive, use Medihoney or Manuka Honey. Wound #9 Right,Distal,Lateral Foot: Santyl Ointment - If too expensive, use Medihoney or Manuka Honey. Secondary Dressing: Wound #5 Right,Distal Lower Leg: Conform/Kerlix Non-adherent pad Wound #6 Right,Proximal Lower Leg: Conform/Kerlix Non-adherent pad Wound #7 Right,Dorsal Metatarsal head first: Conform/Kerlix Non-adherent pad Wound #8 Right,Dorsal Toe Third: Conform/Kerlix Non-adherent pad Wound #9 Right,Distal,Lateral Foot: Conform/Kerlix Non-adherent pad Dressing Change Frequency: Wound #5 Right,Distal Lower Leg: Change dressing every day. Wound #6 Right,Proximal Lower Leg: Change dressing every day. Wound #7 Right,Dorsal Metatarsal head first: Change dressing every day. Wound #8 Right,Dorsal Toe Third: Change dressing every day. Wound #9 Right,Distal,Lateral Foot: Change dressing every day. Follow-up Appointments: MARSA, MATTEO ([id number]) Wound #5 Right,Distal Lower Leg: Return Appointment in 1 week. Wound #6  Right,Proximal Lower Leg: Return Appointment in 1 week. Wound #7 Right,Dorsal Metatarsal head first: Return Appointment in 1 week. Wound #8 Right,Dorsal Toe Third: Return Appointment in 1 week. Wound #9 Right,Distal,Lateral Foot: Return Appointment in 1 week. Edema Control: Wound #5 Right,Distal Lower Leg: Elevate legs to the level of the heart and pump ankles as often as possible Wound #6 Right,Proximal Lower Leg: Elevate legs to the level of the heart and pump ankles as often as possible Wound #7 Right,Dorsal Metatarsal head first: Elevate legs to the level of the heart and pump ankles as often as possible Wound #8 Right,Dorsal Toe Third: Elevate legs to the level of the heart and pump ankles as often as possible Wound #9 Right,Distal,Lateral Foot: Elevate legs to the level of the heart and pump ankles as often as possible Home Health: Wound #5 Right,Distal Lower Leg: Continue Home Health Visits - [facility] twice weekly, [facility], teach family member to change on off day. Home Health Nurse may visit PRN to address patient s wound care needs. FACE TO FACE ENCOUNTER: MEDICARE and MEDICAID PATIENTS: I certify that this patient is under my care and that I had a face-to-face encounter that meets the physician face-to-face encounter requirements with this patient on this date. The encounter with the patient was in whole or in part for the following MEDICAL CONDITION: (primary reason for [place]) MEDICAL NECESSITY: I certify, that based on my findings, NURSING services are a medically necessary home health service. HOME BOUND STATUS: I certify that my clinical findings support that this patient is homebound (i.e., Due to illness or injury, pt requires aid of supportive devices such as crutches, cane, wheelchairs, walkers, the use of special transportation or the assistance of another person to leave their place of residence. There is a normal inability to leave  the home and doing so requires considerable and taxing effort. Other absences are for medical reasons / religious  services and are infrequent or of short duration when for other reasons). If current dressing causes regression in wound condition, may D/C ordered dressing product/s and apply Normal Saline Moist Dressing daily until next [place] / Other MD appointment. [place] of regression in wound condition at [phone]. Please direct any NON-WOUND related issues/requests for orders to patient's Primary Care Physician Wound #6 Right,Proximal Lower Leg: [place] Visits - [facility] twice weekly, [facility]-Wednesday, teach family member to change on off day. Home Health Nurse may visit PRN to address patient s wound care needs. FACE TO FACE ENCOUNTER: MEDICARE and MEDICAID PATIENTS: I certify that this patient is under my care and that I had a face-to-face encounter that meets the physician face-to-face encounter requirements with this patient on this date. The encounter with the patient was in whole or in part for the following MEDICAL CONDITION: (primary reason for [place]) MEDICAL NECESSITY: I certify, that based on my findings, NURSING services are a medically necessary home health service. HOME BOUND STATUS: I certify that my clinical findings support that this patient is homebound (i.e., Due to illness or injury, pt requires aid of supportive devices such as crutches, cane, wheelchairs, walkers, the use [place], Arial V. ([id number]) of special transportation or the assistance of another person to leave their place of residence. There is a normal inability to leave the home and doing so requires considerable and taxing effort. Other absences are for medical reasons / religious services and are infrequent or of short duration when for other reasons). If current dressing causes regression in wound condition, may D/C ordered dressing  product/s and apply Normal Saline Moist Dressing daily until next [place] / Other MD appointment. [place] of regression in wound condition at [phone]. Please direct any NON-WOUND related issues/requests for orders to patient's Primary Care Physician Wound #7 Right,Dorsal Metatarsal head first: [place] Visits - [facility] twice weekly, [facility]-Wednesday, teach family member to change on off day. Home Health Nurse may visit PRN to address patient s wound care needs. FACE TO FACE ENCOUNTER: MEDICARE and MEDICAID PATIENTS: I certify that this patient is under my care and that I had a face-to-face encounter that meets the physician face-to-face encounter requirements with this patient on this date. The encounter with the patient was in whole or in part for the following MEDICAL CONDITION: (primary reason for [place]) MEDICAL NECESSITY: I certify, that based on my findings, NURSING services are a medically necessary home health service. HOME BOUND STATUS: I certify that my clinical findings support that this patient is homebound (i.e., Due to illness or injury, pt requires aid of supportive devices such as crutches, cane, wheelchairs, walkers, the use of special transportation or the assistance of another person to leave their place of residence. There is a normal inability to leave the home and doing so requires considerable and taxing effort. Other absences are for medical reasons / religious services and are infrequent or of short duration when for other reasons). If current dressing causes regression in wound condition, may D/C ordered dressing product/s and apply Normal Saline Moist Dressing daily until next [place] / Other MD appointment. [place] of regression in wound condition at [phone]. Please direct any NON-WOUND related issues/requests for orders to patient's Primary Care  Physician Wound #8 Right,Dorsal Toe Third: [place] Visits - [facility] twice weekly, [facility]-Wednesday, teach family member to change on  off day. Home Health Nurse may visit PRN to address patient s wound care needs. FACE TO FACE ENCOUNTER: MEDICARE and MEDICAID PATIENTS: I certify that this patient is under my care and that I had a face-to-face encounter that meets the physician face-to-face encounter requirements with this patient on this date. The encounter with the patient was in whole or in part for the following MEDICAL CONDITION: (primary reason for [place]) MEDICAL NECESSITY: I certify, that based on my findings, NURSING services are a medically necessary home health service. HOME BOUND STATUS: I certify that my clinical findings support that this patient is homebound (i.e., Due to illness or injury, pt requires aid of supportive devices such as crutches, cane, wheelchairs, walkers, the use of special transportation or the assistance of another person to leave their place of residence. There is a normal inability to leave the home and doing so requires considerable and taxing effort. Other absences are for medical reasons / religious services and are infrequent or of short duration when for other reasons). If current dressing causes regression in wound condition, may D/C ordered dressing product/s and apply Normal Saline Moist Dressing daily until next [employer] / Other MD appointment. [place] of regression in wound condition at [phone]. Please direct any NON-WOUND related issues/requests for orders to patient's Primary Care Physician Wound #9 Right,Distal,Lateral Foot: [place] Visits - [facility] twice weekly, [facility]-Wednesday, teach family member to change on off day. Home Health Nurse may visit PRN to address patient s wound care needs. FACE TO FACE ENCOUNTER: MEDICARE and MEDICAID PATIENTS: I  certify that this patient is under my care and that I had a face-to-face encounter that meets the physician face-to-face encounter requirements with this patient on this date. The encounter with the patient was in whole or in part for the following MEDICAL CONDITION: (primary reason for [place]) MEDICAL NECESSITY: I certify, Flock, Macayla V. ([id number]) that based on my findings, NURSING services are a medically necessary home health service. HOME BOUND STATUS: I certify that my clinical findings support that this patient is homebound (i.e., Due to illness or injury, pt requires aid of supportive devices such as crutches, cane, wheelchairs, walkers, the use of special transportation or the assistance of another person to leave their place of residence. There is a normal inability to leave the home and doing so requires considerable and taxing effort. Other absences are for medical reasons / religious services and are infrequent or of short duration when for other reasons). If current dressing causes regression in wound condition, may D/C ordered dressing product/s and apply Normal Saline Moist Dressing daily until next [place] / Other MD appointment. [place] of regression in wound condition at [phone]. Please direct any NON-WOUND related issues/requests for orders to patient's Primary Care Physician Medications-please add to medication list.: Wound #5 Right,Distal Lower Leg: Santyl Enzymatic Ointment Wound #6 Right,Proximal Lower Leg: Santyl Enzymatic Ointment Wound #7 Right,Dorsal Metatarsal head first: Santyl Enzymatic Ointment Wound #8 Right,Dorsal Toe Third: Santyl Enzymatic Ointment Wound #9 Right,Distal,Lateral Foot: Santyl Enzymatic Ointment #1 wounds in the right leg secondary to type 2 diabetes with known severe PAD. She is status post an attempted revascularization in July 2017. These wounds have never really healed. All of them are in  a similar state still covered in some surface eschar that will require Santyl. I did not debrided this today although an attempt at debridement may be necessary. #2 I reviewed  Dr. Lianne Moris notes for March 23. Given her overall status C was not keen to go on with any further revascularization attempts follow-up vascular studies in 3 months. MRI previously did not show osteomyelitis in the right foot #3 I am uncertain about this patient's gross lower extremity edema. The cause of this is not clear. She has mild to moderate aortic stenosis but no obvious signs of congestive heart failure. Kidney function was within normal limits last time it was checked I don't see a urine protein level this probably should be checked. Last albumin was 3.4 is on Lasix 40 twice a day this may need to be increased. She does not have severe kidney failure Electronic Signature(s) Signed: 11/13/2016 5:13:56 PM By: Linton Ham MD Entered By: Linton Ham on 11/13/2016 09:18:41 Huitron, Analeise [name] ([id number]) -------------------------------------------------------------------------------- SuperBill Details Patient Name: Simm, Jorie V. Date of Service: 11/13/2016 Medical Record Patient Account Number: [id number] Number: Treating RN: Baruch Gouty, RN, BSN, Velva Harman 30-Jan-1941 [id number] y.o. Other Clinician: Date of Birth/Sex: Female) Treating [place] Sigmund, 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 E11.52 Type 2 diabetes mellitus with diabetic peripheral angiopathy with gangrene I70.232 Atherosclerosis of native arteries of right leg with  ulceration of calf I70.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 of foot E43 Unspecified severe protein-calorie malnutrition Facility Procedures CPT4 Code: [id number] Description: 15400 - WOUND CARE VISIT-LEV 5 EST PT Modifier: Quantity: 1 Physician Procedures CPT4: Description Modifier Quantity Code [id number] - WC PHYS LEVEL 4 - EST PT 1 ICD-10 Description Diagnosis E11.622 Type 2 diabetes mellitus with other skin ulcer E11.621 Type 2 diabetes mellitus with foot ulcer 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

## 2016-11-15 NOTE — Progress Notes (Signed)
PERRI, ARAGONES ([id number]) Visit Report for 11/13/2016 Arrival Information Details Patient Name: [name], [name] Date of Service: 11/13/2016 8:15 AM Medical Record Number: [id number] Patient Account Number: [id number] Date of Birth/Sex: 24-Nov-1940 (76 y.o. Female) Treating RN: Afful, RN, BSN, Velva Harman Primary Care Arben Packman: Beverlyn Roux Other Clinician: Referring Gregary Blackard: Beverlyn Roux Treating Rekita Miotke/Extender: Tito Dine in Treatment: 1 Visit Information History Since Last Visit All ordered tests and consults were completed: No Patient Arrived: Wheel Chair Added or deleted any medications: No Arrival Time: 08:19 Any new allergies or adverse reactions: No Accompanied By: husband Had a fall or experienced change in No Transfer Assistance: EasyPivot activities of daily living that may affect Patient Lift risk of falls: Patient Identification Verified: Yes Signs or symptoms of abuse/neglect since last No Secondary Verification Process Yes visito Completed: Hospitalized since last visit: No Patient Requires Transmission- No Pain Present Now: Yes Based Precautions: Patient Has Alerts: No Electronic Signature(s) Signed: 11/13/2016 5:19:42 PM By: Regan Lemming BSN, RN Entered By: Regan Lemming on 11/13/2016 08:31:46 Kok, Abigail [name] ([id number]) -------------------------------------------------------------------------------- Clinic Level of Care Assessment Details Patient Name: Cregger, Adrian V. Date of Service: 11/13/2016 8:15 AM Medical Record Number: [id number] Patient Account Number: [id number] Date of Birth/Sex: 04-30-1941 (76 y.o. Female) Treating RN: Afful, RN, BSN, [employer] Primary Care Maor Meckel: Beverlyn Roux Other Clinician: Referring Shantay Sonn: Beverlyn Roux Treating Keeghan Bialy/Extender: Tito Dine in Treatment: 1 Clinic Level of Care Assessment Items TOOL 4 Quantity Score []  - Use when only an EandM is performed on FOLLOW-UP visit 0 ASSESSMENTS - Nursing  Assessment / Reassessment X - Reassessment of Co-morbidities (includes updates in patient status) 1 10 X - Reassessment of Adherence to Treatment Plan 1 5 ASSESSMENTS - Wound and Skin Assessment / Reassessment []  - Simple Wound Assessment / Reassessment - one wound 0 X - Complex Wound Assessment / Reassessment - multiple wounds 5 5 []  - Dermatologic / Skin Assessment (not related to wound area) 0 ASSESSMENTS - Focused Assessment []  - Circumferential Edema Measurements - multi extremities 0 []  - Nutritional Assessment / Counseling / Intervention 0 X - Lower Extremity Assessment (monofilament, tuning fork, pulses) 1 5 []  - Peripheral Arterial Disease Assessment (using hand held doppler) 0 ASSESSMENTS - Ostomy and/or Continence Assessment and Care []  - Incontinence Assessment and Management 0 []  - Ostomy Care Assessment and Management (repouching, etc.) 0 PROCESS - Coordination of Care X - Simple Patient / Family Education for ongoing care 1 15 []  - Complex (extensive) Patient / Family Education for ongoing care 0 X - Staff obtains Programmer, systems, Records, Test Results / Process Orders 1 10 X - Staff telephones HHA, Nursing Homes / Clarify orders / etc 1 10 []  - Routine Transfer to another Facility (non-emergent condition) 0 Harshfield, Donnie V. ([id number]) []  - Routine Hospital Admission (non-emergent condition) 0 []  - New Admissions / Biomedical engineer / Ordering NPWT, Apligraf, etc. 0 []  - Emergency Hospital Admission (emergent condition) 0 []  - Simple Discharge Coordination 0 []  - Complex (extensive) Discharge Coordination 0 PROCESS - Special Needs []  - Pediatric / Minor Patient Management 0 []  - Isolation Patient Management 0 []  - Hearing / Language / Visual special needs 0 []  - Assessment of Community assistance (transportation, D/C planning, etc.) 0 []  - Additional assistance / Altered mentation 0 []  - Support Surface(s) Assessment (bed, cushion, seat, etc.) 0 INTERVENTIONS - Wound  Cleansing / Measurement []  - Simple Wound Cleansing - one wound 0 X - Complex Wound Cleansing - multiple wounds 5 5  X - Wound Imaging (photographs - any number of wounds) 1 5 []  - Wound Tracing (instead of photographs) 0 []  - Simple Wound Measurement - one wound 0 X - Complex Wound Measurement - multiple wounds 5 5 INTERVENTIONS - Wound Dressings X - Small Wound Dressing one or multiple wounds 5 10 []  - Medium Wound Dressing one or multiple wounds 0 []  - Large Wound Dressing one or multiple wounds 0 []  - Application of Medications - topical 0 []  - Application of Medications - injection 0 INTERVENTIONS - Miscellaneous []  - External ear exam 0 Dohn, Shaquela V. ([id number]) []  - Specimen Collection (cultures, biopsies, blood, body fluids, etc.) 0 []  - Specimen(s) / Culture(s) sent or taken to Lab for analysis 0 X - Patient Transfer (multiple staff / Harrel Lemon Lift / Similar devices) 1 10 []  - Simple Staple / Suture removal (25 or less) 0 []  - Complex Staple / Suture removal (26 or more) 0 []  - Hypo / Hyperglycemic Management (close monitor of Blood Glucose) 0 []  - Ankle / Brachial Index (ABI) - do not check if billed separately 0 X - Vital Signs 1 5 Has the patient been seen at the hospital within the last three years: Yes Total Score: 200 Level Of Care: New/Established - Level 5 Electronic Signature(s) Signed: 11/13/2016 5:19:42 PM By: Regan Lemming BSN, RN Entered By: Regan Lemming on 11/13/2016 09:12:34 Reister, Anwita [name] ([id number]) -------------------------------------------------------------------------------- Encounter Discharge Information Details Patient Name: Scheibel, Ranyah V. Date of Service: 11/13/2016 8:15 AM Medical Record Number: [id number] Patient Account Number: [id number] Date of Birth/Sex: June 08, 1941 (76 y.o. Female) Treating RN: Afful, RN, BSN, Velva Harman Primary Care Kaliyah Gladman: Beverlyn Roux Other Clinician: Referring Caylin Raby: Beverlyn Roux Treating Jaece Ducharme/Extender: Tito Dine in Treatment: 1 Encounter Discharge Information Items Discharge Pain Level: 0 Discharge Condition: Stable Ambulatory Status: Wheelchair Discharge Destination: Home Transportation: Private Auto Accompanied By: husband Schedule Follow-up Appointment: No Medication Reconciliation completed and provided to Patient/Care No Samar Dass: Provided on Clinical Summary of Care: 11/13/2016 Form Type Recipient Paper Patient [facility] Electronic Signature(s) Signed: 11/13/2016 9:15:17 AM By: Ruthine Dose Entered By: Ruthine Dose on 11/13/2016 09:15:17 Niven, Charlina V. ([id number]) -------------------------------------------------------------------------------- Lower Extremity Assessment Details Patient Name: Gunn, Vanissa V. Date of Service: 11/13/2016 8:15 AM Medical Record Number: [id number] Patient Account Number: [id number] Date of Birth/Sex: 1941-03-17 (76 y.o. Female) Treating RN: Afful, RN, BSN, [employer] Primary Care Phyllis Abelson: Beverlyn Roux Other Clinician: Referring Kemani Demarais: Beverlyn Roux Treating Dorian Duval/Extender: Tito Dine in Treatment: 1 Edema Assessment Assessed: [Left: No] [Right: No] Edema: [Left: Yes] [Right: Yes] Calf Left: Right: Point of Measurement: 34 cm From Medial Instep 42.2 cm 44.8 cm Ankle Left: Right: Point of Measurement: 11 cm From Medial Instep 28.5 cm 29 cm Vascular Assessment Claudication: Claudication Assessment [Left:None] [Right:None] Pulses: Dorsalis Pedis Palpable: [Left:No] [Right:No] Doppler Audible: [Left:Yes] [Right:Yes] Posterior Tibial Extremity colors, hair growth, and conditions: Extremity Color: [Left:Normal] [Right:Mottled] Hair Growth on Extremity: [Left:Yes] [Right:Yes] Temperature of Extremity: [Left:Warm] [Right:Warm] Capillary Refill: [Left:< 3 seconds] [Right:< 3 seconds] Toe Nail Assessment Left: Right: Thick: Yes Yes Discolored: Yes Yes Deformed: No No Improper Length and Hygiene: Yes Yes Electronic  Signature(s) Signed: 11/13/2016 5:19:42 PM By: Regan Lemming BSN, RN Fretwell, Lashanta V. ([id number]) Entered By: Regan Lemming on 11/13/2016 08:42:39 Maheu, Makaila V. ([id number]) -------------------------------------------------------------------------------- Multi Wound Chart Details Patient Name: Snoke, Dorota V. Date of Service: 11/13/2016 8:15 AM Medical Record Number: [id number] Patient Account Number: [id number] Date of Birth/Sex: [birth year] (76 y.o. Female) Treating RN: Afful, RN, BSN, [employer]  Primary Care Satine Hausner: Beverlyn Roux Other Clinician: Referring Niema Carrara: Beverlyn Roux Treating Peter Keyworth/Extender: Tito Dine in Treatment: 1 Vital Signs Height(in): 64 Pulse(bpm): 74 Weight(lbs): 200 Blood Pressure 141/55 (mmHg): Body Mass Index(BMI): 34 Temperature(F): 98.1 Respiratory Rate 17 (breaths/min): Photos: [5:No Photos] [6:No Photos] [7:No Photos] Wound Location: [5:Right Lower Leg - DistalRight Lower Leg -] [6:Proximal] [7:Right Metatarsal head first - Dorsal] Wounding Event: [5:Gradually Appeared] [6:Gradually Appeared] [7:Gradually Appeared] 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] [6:the Lower Extremity] [7:Diabetic Wound/Ulcer of the Lower Extremity] Comorbid History: [5:Cataracts, Chronic sinus Cataracts, Chronic sinus problems/congestion, Congestive Heart Failure, Congestive Heart Failure, Hypertension, Type II Diabetes] [6:problems/congestion, Hypertension, Type II Diabetes] [7:Cataracts, Chronic  sinus problems/congestion, Congestive Heart Failure, Hypertension, Type II Diabetes] Date Acquired: [5:09/09/2016] [6:09/09/2016] [7:08/06/2013] Weeks of Treatment: [5:1] [6:1] [7:1] Wound Status: [5:Open] [6:Open] [7:Open] Measurements L x W x D 0.2x0.2x0.1 [6:1x0.3x0.1] [7:2x1x0.4] (cm) Area (cm) : [5:0.031] [6:0.236] [7:1.571] Volume (cm) : [5:0.003] [6:0.024] [7:0.628] % Reduction in Area: [5:98.20%] [6:24.80%]  [7:-33.40%] % Reduction in Volume: 98.30% [6:22.60%] [7:-77.90%] Classification: [5:Grade 1] [6:Grade 2] [7:Grade 2] Exudate Amount: [5:None Present] [6:Medium] [7:Large] Exudate Type: [5:N/A] [6:Serous] [7:Serous] Exudate Color: [5:N/A] [6:amber] [7:amber] Wound Margin: [5:Flat and Intact] [6:Flat and Intact] [7:Flat and Intact] Granulation Amount: [5:Large (67-100%)] [6:None Present (0%)] [7:None Present (0%)] Granulation Quality: [5:Pink, Friable] [6:N/A] [7:N/A] Necrotic Amount: [5:None Present (0%)] [6:Large (67-100%)] [7:Large (67-100%)] Necrotic Tissue: [5:N/A] [6:Adherent Slough] [7:Eschar, Adherent Slough] Exposed Structures: Canan, Madalen V. ([id number]) Fascia: No Fat Layer (Subcutaneous Fat Layer (Subcutaneous Fat Layer (Subcutaneous Tissue) Exposed: Yes Tissue) Exposed: Yes Tissue) Exposed: No Fascia: No Fascia: No Tendon: No Tendon: No Tendon: No Muscle: No Muscle: No Muscle: No Joint: No Joint: No Joint: No Bone: No Bone: No Bone: No Limited to Skin Breakdown Epithelialization: Large (67-100%) None None Periwound Skin Texture: Excoriation: No Excoriation: No Scarring: Yes Induration: No Induration: No Callus: No Callus: No Crepitus: No Crepitus: No Rash: No Rash: No Scarring: No Scarring: No Periwound Skin Maceration: No Maceration: No Maceration: Yes Moisture: Dry/Scaly: No Dry/Scaly: No Periwound Skin Color: Hemosiderin Staining: Yes Atrophie Blanche: No Ecchymosis: Yes Atrophie Blanche: No Cyanosis: No Cyanosis: No Ecchymosis: No Ecchymosis: No Erythema: No Erythema: No Hemosiderin Staining: No Mottled: No Mottled: No Pallor: No Pallor: No Rubor: No Rubor: No Temperature: No Abnormality No Abnormality No Abnormality Tenderness on Yes Yes Yes Palpation: Wound Preparation: Ulcer Cleansing: Ulcer Cleansing: Ulcer Cleansing: Rinsed/Irrigated with Rinsed/Irrigated with Rinsed/Irrigated with Saline Saline Saline Topical Anesthetic  Topical Anesthetic Topical Anesthetic Applied: Other: lidocaine Applied: Other: lidocaine Applied: Other: lidociane 4% 4% 4% Wound Number: [id number] N/A Photos: No Photos No Photos N/A Wound Location: Right Toe Third - Dorsal Right Foot - Lateral, Distal N/A Wounding Event: Gradually Appeared Gradually Appeared N/A 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 ([id number]) Wound Status: Open Open N/A Measurements L x W x D 0.8x1x0.1 0.5x2.3x0.2 N/A (cm) Area (cm) : 0.628 0.903 N/A Volume (cm) : 0.063 0.181 N/A % Reduction in Area: 16.70% 8.00% N/A % Reduction in Volume: 16.00% 7.70% N/A Classification: Unable to visualize wound Grade 1 N/A bed Exudate Amount: None Present Small N/A Exudate Type: N/A Serous N/A Exudate Color: N/A amber N/A Wound Margin: Flat  and Intact Indistinct, nonvisible N/A Granulation Amount: None Present (0%) None Present (0%) N/A Granulation Quality: N/A N/A N/A Necrotic Amount: Large (67-100%) Large (67-100%) N/A Necrotic Tissue: Eschar Adherent Slough N/A Exposed Structures: Fascia: No Fat Layer (Subcutaneous N/A Fat Layer (Subcutaneous Tissue) Exposed: Yes Tissue) Exposed: No Fascia: No Tendon: No Tendon: No Muscle: No Muscle: No Joint: No Joint: No Bone: No Bone: No Limited to Skin Breakdown Epithelialization: None Small (1-33%) N/A Periwound Skin Texture: No Abnormalities Noted Excoriation: No N/A Induration: No Callus: No Crepitus: No Rash: No Scarring: No Periwound Skin Dry/Scaly: Yes Maceration: No N/A Moisture: Dry/Scaly: No Periwound Skin Color: No  Abnormalities Noted Hemosiderin Staining: Yes N/A Atrophie Blanche: No Cyanosis: No Ecchymosis: No Erythema: No Mottled: No Pallor: No Rubor: No Temperature: No Abnormality No Abnormality N/A Tenderness on Yes Yes N/A Palpation: Wound Preparation: Ulcer Cleansing: Ulcer Cleansing: N/A Rinsed/Irrigated with Rinsed/Irrigated with Saline Saline Zerby, Abri V. ([id number]) Topical Anesthetic Topical Anesthetic Applied: Other: lidocioane Applied: Other: lidocaine 4% 4% Treatment Notes Electronic Signature(s) Signed: 11/13/2016 5:13:56 PM By: Linton Ham MD Previous Signature: 11/13/2016 8:48:57 AM Version By: Regan Lemming BSN, RN Entered By: Linton Ham on 11/13/2016 09:10:37 Oliveri, [name] ([id number]) -------------------------------------------------------------------------------- Multi-Disciplinary Care Plan Details Patient Name: Kalbfleisch, Journie V. Date of Service: 11/13/2016 8:15 AM Medical Record Number: [id number] Patient Account Number: [id number] Date of Birth/Sex: 04/10/1941 (76 y.o. Female) Treating RN: Afful, RN, BSN, Velva Harman Primary Care Mileidy Atkin: Beverlyn Roux Other Clinician: Referring Gaylon Bentz: Beverlyn Roux Treating Maribel Luis/Extender: Tito Dine in Treatment: 1 Active Inactive ` Abuse / Safety / Falls / Self Care Management Nursing Diagnoses: Impaired physical mobility Potential for falls Goals: Patient will remain injury free Date Initiated: 11/06/2016 Target Resolution Date: 12/30/2016 Goal Status: Active Patient/caregiver will verbalize understanding of skin care regimen Date Initiated: 11/06/2016 Target Resolution Date: 12/30/2016 Goal Status: Active Interventions: Assess fall risk on admission and as needed Treatment Activities: Patient referred to home care : 11/06/2016 Notes: ` Nutrition Nursing Diagnoses: Potential for alteratiion in Nutrition/Potential for imbalanced nutrition Goals: Patient/caregiver verbalizes understanding of need to  maintain therapeutic glucose control per primary care physician Date Initiated: 11/06/2016 Target Resolution Date: 12/30/2016 Goal Status: Active Interventions: Provide education on elevated blood sugars and impact on wound healing Nam, Somaly V. ([id number]) Notes: ` Orientation to the Wound Care Program Nursing Diagnoses: Knowledge deficit related to the wound healing center program Goals: Patient/caregiver will verbalize understanding of the [place] Date Initiated: 11/06/2016 Target Resolution Date: 12/30/2016 Goal Status: Active Interventions: Provide education on orientation to the wound center Notes: ` Venous Leg Ulcer Nursing Diagnoses: Actual venous Insuffiency (use after diagnosis is confirmed) Knowledge deficit related to disease process and management Goals: Non-invasive venous studies are completed as ordered Date Initiated: 11/06/2016 Target Resolution Date: 12/30/2016 Goal Status: Active Patient/caregiver will verbalize understanding of disease process and disease management Date Initiated: 11/06/2016 Target Resolution Date: 12/30/2016 Goal Status: Active Interventions: Assess peripheral edema status every visit. Notes: ` Wound/Skin Impairment Nursing Diagnoses: Impaired tissue integrity Knowledge deficit related to smoking impact on wound healing Knowledge deficit related to ulceration/compromised skin integrity Goals: Chui, Tremeka V. ([id number]) Ulcer/skin breakdown will heal within 14 weeks Date Initiated: 11/06/2016 Target Resolution Date: 02/05/2017 Goal Status: Active Interventions: Assess ulceration(s) every visit Treatment Activities: Skin care regimen initiated : 11/06/2016 Notes: Electronic Signature(s) Signed: 11/13/2016 8:48:45 AM By: Regan Lemming BSN, RN Entered By: Regan Lemming on 11/13/2016 08:48:44 Kurowski, Tysheka V. ([id number]) -------------------------------------------------------------------------------- Pain Assessment  Details Patient Name: Hobson, Elizabelle V.  Date of Service: 11/13/2016 8:15 AM Medical Record Number: [id number] Patient Account Number: [id number] Date of Birth/Sex: [birth year] (76 y.o. Female) Treating RN: Afful, RN, BSN, Velva Harman Primary Care Kritika Stukes: Beverlyn Roux Other Clinician: Referring Irwin Toran: Beverlyn Roux Treating Nigel Ericsson/Extender: Tito Dine in Treatment: 1 Active Problems Location of Pain Severity and Description of Pain Patient Has Paino Yes Site Locations Pain Location: Pain in Ulcers With Dressing Change: Yes Rate the pain. Current Pain Level: 3 Worst Pain Level: 3 Character of Pain Describe the Pain: Aching, Tender Pain Management and Medication Current Pain Management: Medication: Yes How does your wound impact your activities of daily livingo Sleep: Yes Bathing: Yes Appetite: Yes Relationship With Others: Yes Bladder Continence: Yes Emotions: Yes Bowel Continence: Yes Work: Yes Toileting: Yes Drive: Yes Dressing: Yes Hobbies: Yes Electronic Signature(s) Signed: 11/13/2016 5:19:42 PM By: Regan Lemming BSN, RN Entered By: Regan Lemming on 11/13/2016 08:31:33 Losurdo, Domonique [name] ([id number]) -------------------------------------------------------------------------------- Patient/Caregiver Education Details Patient Name: Wolfert, Justus V. Date of Service: 11/13/2016 8:15 AM Medical Record Patient Account Number: [id number] Number: Treating RN: Baruch Gouty, RN, BSN, Velva Harman Jul 01, 1941 [id number] y.o. Other Clinician: Date of Birth/Gender: Female) Treating ROBSON, [name] Primary Care Physician: Beverlyn Roux Physician/Extender: G Referring Physician: Dorthula Nettles in Treatment: 1 Education Assessment Education Provided To: Patient Education Topics Provided Elevated Blood Sugar/ Impact on Healing: Methods: Explain/Verbal Responses: State content correctly Welcome To [facility]: Methods: Explain/Verbal Responses: State content  correctly Wound/Skin Impairment: Methods: Explain/Verbal Responses: State content correctly Electronic Signature(s) Signed: 11/13/2016 5:19:42 PM By: Regan Lemming BSN, RN Entered By: Regan Lemming on 11/13/2016 09:13:59 Escorcia, Lillyen V. ([id number]) -------------------------------------------------------------------------------- Wound Assessment Details Patient Name: Vanmetre, Lakendria V. Date of Service: 11/13/2016 8:15 AM Medical Record Number: [id number] Patient Account Number: [id number] Date of Birth/Sex: [birth year] (76 y.o. Female) Treating RN: Afful, RN, BSN, Administrator, sports Primary Care Leesa Leifheit: Beverlyn Roux Other Clinician: Referring Ashon Rosenberg: Beverlyn Roux Treating Adie Vilar/Extender: Tito Dine in Treatment: 1 Wound Status Wound Number: 5 Primary Diabetic Wound/Ulcer of the Lower Etiology: Extremity Wound Location: Right Lower Leg - Distal Wound Open Wounding Event: Gradually Appeared Status: Date Acquired: 09/09/2016 Comorbid Cataracts, Chronic sinus Weeks Of Treatment: 1 History: problems/congestion, Congestive Heart Clustered Wound: No Failure, Hypertension, Type II Diabetes Photos Photo Uploaded By: Regan Lemming on 11/13/2016 16:52:53 Wound Measurements Length: (cm) 0.2 Width: (cm) 0.2 Depth: (cm) 0.1 Area: (cm) 0.031 Volume: (cm) 0.003 % Reduction in Area: 98.2% % Reduction in Volume: 98.3% Epithelialization: Large (67-100%) Tunneling: No Undermining: No Wound Description Classification: Grade 1 Foul Odor Aft Wound Margin: Flat and Intact Slough/Fibrin Exudate Amount: None Present Kloc, Keegan V. ([id number]) er Cleansing: No o No Wound Bed Granulation Amount: Large (67-100%) Exposed Structure Granulation Quality: Pink, Friable Fascia Exposed: No Necrotic Amount: None Present (0%) Fat Layer (Subcutaneous Tissue) Exposed: No Tendon Exposed: No Muscle Exposed: No Joint Exposed: No Bone Exposed: No Limited to Skin Breakdown Periwound Skin Texture Texture  Color No Abnormalities Noted: No No Abnormalities Noted: No Callus: No Atrophie Blanche: No Crepitus: No Cyanosis: No Excoriation: No Ecchymosis: No Induration: No Erythema: No Rash: No Hemosiderin Staining: Yes Scarring: No Mottled: No Pallor: No Moisture Rubor: No No Abnormalities Noted: No Dry / Scaly: No Temperature / Pain Maceration: No Temperature: No Abnormality Tenderness on Palpation: Yes Wound Preparation Ulcer Cleansing: Rinsed/Irrigated with Saline Topical Anesthetic Applied: Other: lidocaine 4%, Treatment Notes Wound #5 (Right, Distal Lower Leg) 1. Cleansed with: Clean wound with Normal Saline 2. Anesthetic Topical Lidocaine 4% cream to wound  bed prior to debridement 4. Dressing Applied: Santyl Ointment 5. Secondary Dressing Applied Kerlix/Conform Non-Adherent pad 7. Secured with Microbiologist) Signed: 11/13/2016 8:45:40 AM By: Regan Lemming BSN, RN Entered By: Regan Lemming on 11/13/2016 08:45:40 [place], ALLYSSON RINEHIMER ([id number]) Glasheen, Maniyah VMarland Kitchen ([id number]) -------------------------------------------------------------------------------- Wound Assessment Details Patient Name: Facemire, Zhanna V. Date of Service: 11/13/2016 8:15 AM Medical Record Number: [id number] Patient Account Number: [id number] Date of Birth/Sex: October 15, 1940 (76 y.o. Female) Treating RN: Afful, RN, BSN, Administrator, sports Primary Care Karlynn Furrow: Beverlyn Roux Other Clinician: Referring Donovan Gatchel: Beverlyn Roux Treating Ashani Pumphrey/Extender: Tito Dine in Treatment: 1 Wound Status Wound Number: 6 Primary Diabetic Wound/Ulcer of the Lower Etiology: Extremity Wound Location: Right Lower Leg - Proximal Wound Open Wounding Event: Gradually Appeared Status: Date Acquired: 09/09/2016 Comorbid Cataracts, Chronic sinus Weeks Of Treatment: 1 History: problems/congestion, Congestive Heart Clustered Wound: No Failure, Hypertension, Type II Diabetes Photos Photo Uploaded By: Regan Lemming on 11/13/2016 16:52:53 Wound Measurements Length: (cm) 1 Width: (cm) 0.3 Depth: (cm) 0.1 Area: (cm) 0.236 Volume: (cm) 0.024 % Reduction in Area: 24.8% % Reduction in Volume: 22.6% Epithelialization: None Tunneling: No Undermining: No Wound Description Classification: Grade 2 Wound Margin: Flat and Intact Exudate Amount: Medium Exudate Type: Serous Roesler, Siria V. ([id number]) Foul Odor After Cleansing: No Slough/Fibrino Yes Exudate Color: amber Wound Bed Granulation Amount: None Present (0%) Exposed Structure Necrotic Amount: Large (67-100%) Fascia Exposed: No Necrotic Quality: Adherent Slough Fat Layer (Subcutaneous Tissue) Exposed: Yes Tendon Exposed: No Muscle Exposed: No Joint Exposed: No Bone Exposed: No Periwound Skin Texture Texture Color No Abnormalities Noted: No No Abnormalities Noted: No Callus: No Atrophie Blanche: No Crepitus: No Cyanosis: No Excoriation: No Ecchymosis: No Induration: No Erythema: No Rash: No Hemosiderin Staining: No Scarring: No Mottled: No Pallor: No Moisture Rubor: No No Abnormalities Noted: No Dry / Scaly: No Temperature / Pain Maceration: No Temperature: No Abnormality Tenderness on Palpation: Yes Wound Preparation Ulcer Cleansing: Rinsed/Irrigated with Saline Topical Anesthetic Applied: Other: lidocaine 4%, Treatment Notes Wound #6 (Right, Proximal Lower Leg) 1. Cleansed with: Clean wound with Normal Saline 2. Anesthetic Topical Lidocaine 4% cream to wound bed prior to debridement 4. Dressing Applied: Santyl Ointment 5. Secondary Dressing Applied Kerlix/Conform Non-Adherent pad 7. Secured with Microbiologist) Signed: 11/13/2016 8:46:03 AM By: Regan Lemming BSN, RN Ailey, Glendine V. ([id number]) Entered By: Regan Lemming on 11/13/2016 08:46:02 Ned, Kyriana [name] ([id number]) -------------------------------------------------------------------------------- Wound Assessment Details Patient  Name: Chamblin, Rya V. Date of Service: 11/13/2016 8:15 AM Medical Record Number: [id number] Patient Account Number: [id number] Date of Birth/Sex: April 29, 1941 (76 y.o. 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 ([id number]) Foul Odor After Cleansing: No Slough/Fibrino Yes Exudate Color: amber Wound Bed Granulation Amount: None Present (0%) Exposed  Structure Necrotic Amount: Large (67-100%) Fascia Exposed: No Necrotic Quality: Eschar, Adherent Slough Fat Layer (Subcutaneous Tissue) Exposed: Yes Tendon Exposed: No Muscle Exposed: No Joint Exposed: No Bone Exposed: No Periwound Skin Texture Texture Color No Abnormalities Noted: No No Abnormalities Noted: No Scarring: Yes Ecchymosis: Yes Moisture Temperature / Pain No Abnormalities Noted: No Temperature: No Abnormality Maceration: Yes Tenderness on Palpation: Yes Wound Preparation Ulcer Cleansing: Rinsed/Irrigated with Saline Topical Anesthetic Applied: Other: lidociane  4%, Treatment Notes Wound #7 (Right, Dorsal Metatarsal head first) 1. Cleansed with: Clean wound with Normal Saline 2. Anesthetic Topical Lidocaine 4% cream to wound bed prior to debridement 4. Dressing Applied: Santyl Ointment 5. Secondary Dressing Applied Kerlix/Conform Non-Adherent pad 7. Secured with Microbiologist) Signed: 11/13/2016 8:46:27 AM By: Regan Lemming BSN, RN Entered By: Regan Lemming on 11/13/2016 08:46:26 Brickley, Dessa [name] ([id number]) -------------------------------------------------------------------------------- Wound Assessment Details Patient Name: Virgo, Vaidehi V. Date of Service: 11/13/2016 8:15 AM Medical Record Number: [id number] Patient Account Number: [id number] Date of Birth/Sex: September 05, 1940 (76 y.o. Female) Treating RN: Afful, RN, BSN, Administrator, sports Primary Care Alazne Quant: Beverlyn Roux Other Clinician: Referring Jaimes Eckert: Beverlyn Roux Treating Kendal Raffo/Extender: Tito Dine in Treatment: 1 Wound Status Wound Number: 8 Primary Diabetic Wound/Ulcer of the Lower Etiology: Extremity Wound Location: Right Toe Third - Dorsal Wound Open Wounding Event: Gradually Appeared Status: Date Acquired: 10/28/2016 Comorbid Cataracts, Chronic sinus Weeks Of Treatment: 1 History: problems/congestion, Congestive Heart Clustered Wound: No Failure, Hypertension, Type II Diabetes Photos Photo Uploaded By: Regan Lemming on 11/13/2016 16:54:53 Wound Measurements Length: (cm) 0.8 Width: (cm) 1 Depth: (cm) 0.1 Area: (cm) 0.628 Volume: (cm) 0.063 % Reduction in Area: 16.7% % Reduction in Volume: 16% Epithelialization: None Tunneling: No Undermining: No Wound Description Classification: Unable to visualize wound bed Foul Odor Aft Wound Margin: Flat and Intact Slough/Fibrin Exudate Amount: None Present er Cleansing: No o No Wound Bed Granulation Amount: None Present (0%) Exposed Structure Necrotic Amount: Large (67-100%) Fascia Exposed:  No Necrotic Quality: Eschar Fat Layer (Subcutaneous Tissue) Exposed: No Tendon Exposed: No Muscle Exposed: No Joint Exposed: No Prest, Latrece V. ([id number]) Bone Exposed: No Limited to Skin Breakdown Periwound Skin Texture Texture Color No Abnormalities Noted: No No Abnormalities Noted: No Moisture Temperature / Pain No Abnormalities Noted: No Temperature: No Abnormality Dry / Scaly: Yes Tenderness on Palpation: Yes Wound Preparation Ulcer Cleansing: Rinsed/Irrigated with Saline Topical Anesthetic Applied: Other: lidocioane 4%, Treatment Notes Wound #8 (Right, Dorsal Toe Third) 1. Cleansed with: Clean wound with Normal Saline 2. Anesthetic Topical Lidocaine 4% cream to wound bed prior to debridement 4. Dressing Applied: Santyl Ointment 5. Secondary Dressing Applied Kerlix/Conform Non-Adherent pad 7. Secured with Microbiologist) Signed: 11/13/2016 8:46:58 AM By: Regan Lemming BSN, RN Entered By: Regan Lemming on 11/13/2016 08:46:57 Anand, Kryslyn V. ([id number]) -------------------------------------------------------------------------------- Wound Assessment Details Patient Name: Murry, Kanika V. Date of Service: 11/13/2016 8:15 AM Medical Record Number: [id number] Patient Account Number: [id number] Date of Birth/Sex: [birth year] (76 y.o. Female) Treating RN: Afful, RN, BSN, Velva Harman Primary Care Cordney Barstow: Beverlyn Roux Other Clinician: Referring Hernandez Losasso: Beverlyn Roux Treating Justice Aguirre/Extender: Tito Dine in Treatment: 1 Wound Status Wound Number: 9 Primary Diabetic Wound/Ulcer of the Lower Etiology: Extremity Wound Location: Right Foot - Lateral, Distal Wound Open Wounding Event: Gradually Appeared Status: Date Acquired: 10/14/2016 Comorbid Cataracts, Chronic sinus Weeks Of Treatment: 1 History: problems/congestion, Congestive Heart Clustered Wound: No Failure, Hypertension, Type II Diabetes Photos Photo Uploaded By: Regan Lemming on  11/13/2016 16:55:21 Wound Measurements Length: (cm) 0.5  Width: (cm) 2.3 Depth: (cm) 0.2 Area: (cm) 0.903 Volume: (cm) 0.181 % Reduction in Area: 8% % Reduction in Volume: 7.7% Epithelialization: Small (1-33%) Tunneling: No Undermining: No Wound Description Classification: Grade 1 Wound Margin: Indistinct, nonvisible Exudate Amount: Small Exudate Type: Serous Exudate Color: amber Foul Odor After Cleansing: No Slough/Fibrino Yes Wound Bed Granulation Amount: None Present (0%) Exposed Structure Necrotic Amount: Large (67-100%) Fascia Exposed: No Necrotic Quality: Adherent Slough Fat Layer (Subcutaneous Tissue) Exposed: Yes Tendon Exposed: No Mundie, Abryanna V. ([id number]) Muscle Exposed: No Joint Exposed: No Bone Exposed: No Periwound Skin Texture Texture Color No Abnormalities Noted: No No Abnormalities Noted: No Callus: No Atrophie Blanche: No Crepitus: No Cyanosis: No Excoriation: No Ecchymosis: No Induration: No Erythema: No Rash: No Hemosiderin Staining: Yes Scarring: No Mottled: No Pallor: No Moisture Rubor: No No Abnormalities Noted: No Dry / Scaly: No Temperature / Pain Maceration: No Temperature: No Abnormality Tenderness on Palpation: Yes Wound Preparation Ulcer Cleansing: Rinsed/Irrigated with Saline Topical Anesthetic Applied: Other: lidocaine 4%, Treatment Notes Wound #9 (Right, Distal, Lateral Foot) 1. Cleansed with: Clean wound with Normal Saline 2. Anesthetic Topical Lidocaine 4% cream to wound bed prior to debridement 4. Dressing Applied: Santyl Ointment 5. Secondary Dressing Applied Kerlix/Conform Non-Adherent pad 7. Secured with Microbiologist) Signed: 11/13/2016 8:47:56 AM By: Regan Lemming BSN, RN Entered By: Regan Lemming on 11/13/2016 08:47:55 Luba, Arcelia [name] ([id number]) -------------------------------------------------------------------------------- Vitals Details Patient Name: Pinard, Shamya V. Date of  Service: 11/13/2016 8:15 AM Medical Record Number: [id number] Patient Account Number: [id number] Date of Birth/Sex: February 20, 1941 (76 y.o. Female) Treating RN: Afful, RN, BSN, Velva Harman Primary Care Kerisha Goughnour: Beverlyn Roux Other Clinician: Referring Zacariah Belue: Beverlyn Roux Treating Olympia Adelsberger/Extender: Tito Dine in Treatment: 1 Vital Signs Time Taken: 08:25 Temperature (F): 98.1 Height (in): 64 Pulse (bpm): 74 Weight (lbs): 200 Respiratory Rate (breaths/min): 17 Body Mass Index (BMI): 34.3 Blood Pressure (mmHg): 141/55 Reference Range: 80 - 120 mg / dl Electronic Signature(s) Signed: 11/13/2016 5:19:42 PM By: Regan Lemming BSN, RN Entered By: Regan Lemming on 11/13/2016 08:32:16

## 2016-11-20 ENCOUNTER — Encounter: Payer: Medicare HMO | Admitting: Internal Medicine

## 2016-11-20 DIAGNOSIS — E11622 Type 2 diabetes mellitus with other skin ulcer: Secondary | ICD-10-CM | POA: Diagnosis not present

## 2016-11-21 NOTE — Progress Notes (Signed)
[name], [name] ([id number]) Visit Report for 11/20/2016 Arrival Information Details Patient Name: [name], [name] Date of Service: 11/20/2016 10:15 AM Medical Record Number: [id number] Patient Account Number: [id number] Date of Birth/Sex: October 05, 1940 (76 y.o. Female) Treating RN: Afful, RN, BSN, Velva Harman Primary Care [name]: Beverlyn Roux Other Clinician: Referring [name]: Beverlyn Roux Treating [name]/Extender: Tito Dine in Treatment: 2 Visit Information History Since Last Visit All ordered tests and consults were completed: No Patient Arrived: Wheel Chair Added or deleted any medications: No Arrival Time: 10:08 Any new allergies or adverse reactions: No Accompanied By: husband Had a fall or experienced change in No Transfer Assistance: EasyP[employer] activities of daily living that may affect Patient Lift risk of falls: Patient Identification Verified: Yes Signs or symptoms of abuse/neglect since last No Secondary Verification Process Yes visito Completed: Hospitalized since last visit: No Patient Requires Transmission- No Has Dressing in Place as Prescribed: Yes Based Precautions: Pain Present Now: Yes Patient Has Alerts: No Electronic Signature(s) Signed: 11/20/2016 4:47:06 PM By: Regan Lemming BSN, RN Entered By: Regan Lemming on 11/20/2016 10:11:25 [name], [name] ([id number]) -------------------------------------------------------------------------------- Clinic Level of Care Assessment Details Patient Name: [name], [name] Date of Service: 11/20/2016 10:15 AM Medical Record Number: [id number] Patient Account Number: [id number] Date of Birth/Sex: [birth year] (76 y.o. Female) Treating RN: Afful, RN, BSN, Administrator, sports Primary Care Ryden Wainer: Beverlyn Roux Other Clinician: Referring Felis Quillin: Beverlyn Roux Treating Eloyce Bultman/Extender: Tito Dine in Treatment: 2 Clinic Level of Care Assessment Items TOOL 4 Quantity Score []  - Use when only an EandM is performed on  FOLLOW-UP visit 0 ASSESSMENTS - Nursing Assessment / Reassessment X - Reassessment of Co-morbidities (includes updates in patient status) 1 10 X - Reassessment of Adherence to Treatment Plan 1 5 ASSESSMENTS - Wound and Skin Assessment / Reassessment []  - Simple Wound Assessment / Reassessment - one wound 0 X - Complex Wound Assessment / Reassessment - multiple wounds 5 5 []  - Dermatologic / Skin Assessment (not related to wound area) 0 ASSESSMENTS - Focused Assessment []  - Circumferential Edema Measurements - multi extremities 0 []  - Nutritional Assessment / Counseling / Intervention 0 X - Lower Extremity Assessment (monofilament, tuning fork, pulses) 1 5 []  - Peripheral Arterial Disease Assessment (using hand held doppler) 0 ASSESSMENTS - Ostomy and/or Continence Assessment and Care []  - Incontinence Assessment and Management 0 []  - Ostomy Care Assessment and Management (repouching, etc.) 0 PROCESS - Coordination of Care X - Simple Patient / Family Education for ongoing care 1 15 []  - Complex (extensive) Patient / Family Education for ongoing care 0 X - Staff obtains Programmer, systems, Records, Test Results / Process Orders 1 10 []  - Staff telephones HHA, Nursing Homes / Clarify orders / etc 0 []  - Routine Transfer to another Facility (non-emergent condition) 0 [name], [name] ([id number]) []  - Routine Hospital Admission (non-emergent condition) 0 []  - New Admissions / Biomedical engineer / Ordering NPWT, Apligraf, etc. 0 []  - Emergency Hospital Admission (emergent condition) 0 []  - Simple Discharge Coordination 0 []  - Complex (extensive) Discharge Coordination 0 PROCESS - Special Needs []  - Pediatric / Minor Patient Management 0 []  - Isolation Patient Management 0 []  - Hearing / Language / Visual special needs 0 []  - Assessment of Community assistance (transportation, D/C planning, etc.) 0 []  - Additional assistance / Altered mentation 0 []  - Support Surface(s) Assessment (bed,  cushion, seat, etc.) 0 INTERVENTIONS - Wound Cleansing / Measurement []  - Simple Wound Cleansing - one wound 0 X - Complex Wound  Cleansing - multiple wounds 5 5 X - Wound Imaging (photographs - any number of wounds) 1 5 []  - Wound Tracing (instead of photographs) 0 []  - Simple Wound Measurement - one wound 0 X - Complex Wound Measurement - multiple wounds 5 5 INTERVENTIONS - Wound Dressings X - Small Wound Dressing one or multiple wounds 5 10 []  - Medium Wound Dressing one or multiple wounds 0 []  - Large Wound Dressing one or multiple wounds 0 []  - Application of Medications - topical 0 []  - Application of Medications - injection 0 INTERVENTIONS - Miscellaneous []  - External ear exam 0 [name], [name] ([id number]) []  - Specimen Collection (cultures, biopsies, blood, body fluids, etc.) 0 []  - Specimen(s) / Culture(s) sent or taken to Lab for analysis 0 []  - Patient Transfer (multiple staff / Harrel Lemon Lift / Similar devices) 0 []  - Simple Staple / Suture removal (25 or less) 0 []  - Complex Staple / Suture removal (26 or more) 0 []  - Hypo / Hyperglycemic Management (close monitor of Blood Glucose) 0 []  - Ankle / Brachial Index (ABI) - do not check if billed separately 0 X - Vital Signs 1 5 Has the patient been seen at the hospital within the last three years: Yes Total Score: 180 Level Of Care: New/Established - Level 5 Electronic Signature(s) Signed: 11/20/2016 4:47:06 PM By: Regan Lemming BSN, RN Entered By: Regan Lemming on 11/20/2016 10:44:06 [name] ([id number]) -------------------------------------------------------------------------------- Encounter Discharge Information Details Patient Name: [name], [name] Date of Service: 11/20/2016 10:15 AM Medical Record Number: [id number] Patient Account Number: [id number] Date of Birth/Sex: 01-Jul-1941 (76 y.o. Female) Treating RN: Afful, RN, BSN, Velva Harman Primary Care Natalie Leclaire: Beverlyn Roux Other Clinician: Referring Maday Guarino: Beverlyn Roux Treating Jeevan Kalla/Extender: Tito Dine in Treatment: 2 Encounter Discharge Information Items Discharge Pain Level: 0 Discharge Condition: Stable Ambulatory Status: Wheelchair Discharge Destination: Home Transportation: Private Auto Accompanied By: husband Schedule Follow-up Appointment: No Medication Reconciliation completed and provided to Patient/Care No Hilary Milks: Provided on Clinical Summary of Care: 11/20/2016 Form Type Recipient Paper Patient [facility] Electronic Signature(s) Signed: 11/20/2016 10:58:34 AM By: Ruthine Dose Entered By: Ruthine Dose on 11/20/2016 10:58:33 [name], [name] ([id number]) -------------------------------------------------------------------------------- Lower Extremity Assessment Details Patient Name: [name], [name] Date of Service: 11/20/2016 10:15 AM Medical Record Number: [id number] Patient Account Number: [id number] Date of Birth/Sex: [birth year] (76 y.o. Female) Treating RN: Afful, RN, BSN, Administrator, sports Primary Care Cassady Turano: Beverlyn Roux Other Clinician: Referring Kimber Esterly: Beverlyn Roux Treating Ragena Fiola/Extender: Tito Dine in Treatment: 2 Edema Assessment Assessed: [Left: No] [Right: No] Edema: [Left: Yes] [Right: Yes] Calf Left: Right: Point of Measurement: 34 cm From Medial Instep 43 cm 44.7 cm Ankle Left: Right: Point of Measurement: 11 cm From Medial Instep 28.5 cm 28.3 cm Vascular Assessment Pulses: Dorsalis Pedis Palpable: [Right:No] Doppler Audible: [Right:Yes] Posterior Tibial Extremity colors, hair growth, and conditions: Extremity Color: [Right:Mottled] Hair Growth on Extremity: [Right:No] Temperature of Extremity: [Right:Warm] Capillary Refill: [Right:< 3 seconds] Toe Nail Assessment Left: Right: Thick: Yes Discolored: Yes Deformed: Yes Improper Length and Hygiene: Yes Electronic Signature(s) Signed: 11/20/2016 4:47:06 PM By: Regan Lemming BSN, RN Entered By: Regan Lemming on 11/20/2016  10:29:00 Derosia, Buffie [name] ([id number]) [name], [name] ([id number]) -------------------------------------------------------------------------------- Multi Wound Chart Details Patient Name: Goffredo, Mckaylee V. Date of Service: 11/20/2016 10:15 AM Medical Record Number: [id number] Patient Account Number: [id number] Date of Birth/Sex: [birth year] (76 y.o. Female) Treating RN: Afful, RN, BSN, [employer] Primary Care Rune Mendez: Beverlyn Roux Other Clinician: Referring Jonnathan Birman: Beverlyn Roux Treating  Azeem Poorman/Extender: Ricard Dillon Weeks in Treatment: 2 Vital Signs Height(in): 64 Pulse(bpm): 72 Weight(lbs): 200 Blood Pressure 153/53 (mmHg): Body Mass Index(BMI): 34 Temperature(F): 97.6 Respiratory Rate 17 (breaths/min): Photos: [5:No Photos] [6:No Photos] [7:No Photos] Wound Location: [5:Right Lower Leg - DistalRight Lower Leg -] [6:Proximal] [7:Right Metatarsal head first - Dorsal] Wounding Event: [5:Gradually Appeared] [6:Gradually Appeared] [7:Gradually Appeared] 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] [6:the Lower Extremity] [7:Diabetic Wound/Ulcer of the Lower Extremity] Comorbid History: [5:Cataracts, Chronic sinus Cataracts, Chronic sinus problems/congestion, Congestive Heart Failure, Congestive Heart Failure, Hypertension, Type II Diabetes] [6:problems/congestion, Hypertension, Type II Diabetes] [7:Cataracts, Chronic  sinus problems/congestion, Congestive Heart Failure, Hypertension, Type II Diabetes] Date Acquired: [5:09/09/2016] [6:09/09/2016] [7:08/06/2013] Weeks of Treatment: [5:2] [6:2] [7:2] Wound Status: [5:Open] [6:Open] [7:Open] Measurements L x W x D 0.3x0.3x0.1 [6:1x0.3x0.1] [7:2.2x1x0.1] (cm) Area (cm) : [5:0.071] [6:0.236] [7:1.728] Volume (cm) : [5:0.007] [6:0.024] [7:0.173] % Reduction in Area: [5:96.00%] [6:24.80%] [7:-46.70%] % Reduction in Volume: 96.00% [6:22.60%] [7:51.00%] Classification: [5:Grade 1] [6:Grade 2]  [7:Grade 2] Exudate Amount: [5:Small] [6:Medium] [7:Large] Exudate Type: [5:N/A] [6:Serous] [7:Serous] Exudate Color: [5:N/A] [6:amber] [7:amber] Wound Margin: [5:Flat and Intact] [6:Flat and Intact] [7:Flat and Intact] Granulation Amount: [5:Large (67-100%)] [6:None Present (0%)] [7:None Present (0%)] Granulation Quality: [5:Pink, Friable] [6:N/A] [7:N/A] Necrotic Amount: [5:None Present (0%)] [6:Large (67-100%)] [7:Large (67-100%)] Necrotic Tissue: [5:N/A] [6:Adherent Slough] [7:Adherent Slough] Exposed Structures: Pita, Lyzette V. ([id number]) Fat Layer (Subcutaneous Fat Layer (Subcutaneous Fat Layer (Subcutaneous Tissue) Exposed: Yes Tissue) Exposed: Yes Tissue) Exposed: Yes Fascia: No Fascia: No Fascia: No Tendon: No Tendon: No Tendon: No Muscle: No Muscle: No Muscle: No Joint: No Joint: No Joint: No Bone: No Bone: No Bone: No Epithelialization: Large (67-100%) Small (1-33%) None Periwound Skin Texture: Scarring: Yes Scarring: Yes Scarring: Yes Excoriation: No Excoriation: No Induration: No Induration: No Callus: No Callus: No Crepitus: No Crepitus: No Rash: No Rash: No Periwound Skin Maceration: No Maceration: No Maceration: Yes Moisture: Dry/Scaly: No Dry/Scaly: No Periwound Skin Color: Hemosiderin Staining: Yes Hemosiderin Staining: Yes Ecchymosis: Yes Atrophie Blanche: No Atrophie Blanche: No Hemosiderin Staining: Yes Cyanosis: No Cyanosis: No Mottled: Yes Ecchymosis: No Ecchymosis: No Erythema: No Erythema: No Mottled: No Mottled: No Pallor: No Pallor: No Rubor: No Rubor: No Temperature: No Abnormality No Abnormality No Abnormality Tenderness on Yes Yes Yes Palpation: Wound Preparation: Ulcer Cleansing: Ulcer Cleansing: Ulcer Cleansing: Rinsed/Irrigated with Rinsed/Irrigated with Rinsed/Irrigated with Saline Saline Saline Topical Anesthetic Topical Anesthetic Topical Anesthetic Applied: Other: lidocaine Applied: Other: lidocaine  Applied: Other: lidociane 4% 4% 4% Wound Number: 8 [id number] N/A Photos: No Photos No Photos N/A Wound Location: Right Toe Third - Dorsal Right Foot - Lateral, Distal N/A Wounding Event: Gradually Appeared Gradually Appeared N/A 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 ([id number]) Measurements L x W x D (cm) Area (cm) : 0.118 0.982 N/A Volume (cm) : 0.012 0.196 N/A % Reduction in Area: 84.40% 0.00% N/A % Reduction in Volume: 84.00% 0.00% N/A Classification: Unable to visualize wound Grade 1 N/A bed Exudate Amount: None Present Small N/A Exudate Type: N/A Serous N/A Exudate Color: N/A amber N/A Wound Margin: Flat and Intact Indistinct, nonvisible N/A Granulation Amount: None Present (0%) None Present (  0%) N/A Granulation Quality: N/A N/A N/A Necrotic Amount: Large (67-100%) Large (67-100%) N/A Necrotic Tissue: Eschar Adherent Slough N/A Exposed Structures: Fascia: No Fat Layer (Subcutaneous N/A Fat Layer (Subcutaneous Tissue) Exposed: Yes Tissue) Exposed: No Fascia: No Tendon: No Tendon: No Muscle: No Muscle: No Joint: No Joint: No Bone: No Bone: No Limited to Skin Breakdown Epithelialization: None Small (1-33%) N/A Periwound Skin Texture: No Abnormalities Noted Excoriation: No N/A Induration: No Callus: No Crepitus: No Rash: No Scarring: No Periwound Skin Dry/Scaly: Yes Maceration: No N/A Moisture: Dry/Scaly: No Periwound Skin Color: No Abnormalities Noted Hemosiderin Staining: Yes N/A Atrophie Blanche: No Cyanosis: No Ecchymosis:  No Erythema: No Mottled: No Pallor: No Rubor: No Temperature: No Abnormality No Abnormality N/A Tenderness on Yes Yes N/A Palpation: Wound Preparation: Ulcer Cleansing: Ulcer Cleansing: N/A Rinsed/Irrigated with Rinsed/Irrigated with Saline Saline Topical Anesthetic Topical Anesthetic Godley, Altie V. ([id number]) Applied: Other: lidocaine Applied: Other: lidocaine 4% 4% Treatment Notes Wound #5 (Right, Distal Lower Leg) 1. Cleansed with: Clean wound with Normal Saline 2. Anesthetic Topical Lidocaine 4% cream to wound bed prior to debridement 4. Dressing Applied: Santyl Ointment 5. Secondary Dressing Applied Kerlix/Conform Non-Adherent pad 7. Secured with Paper tape Wound #6 (Right, Proximal Lower Leg) 1. Cleansed with: Clean wound with Normal Saline 2. Anesthetic Topical Lidocaine 4% cream to wound bed prior to debridement 4. Dressing Applied: Santyl Ointment 5. Secondary Dressing Applied Kerlix/Conform Non-Adherent pad 7. Secured with Paper tape Wound #7 (Right, Dorsal Metatarsal head first) 1. Cleansed with: Clean wound with Normal Saline 2. Anesthetic Topical Lidocaine 4% cream to wound bed prior to debridement 4. Dressing Applied: Santyl Ointment 5. Secondary Dressing Applied Kerlix/Conform Non-Adherent pad 7. Secured with Paper tape Wound #8 (Right, Dorsal Toe Third) 1. Cleansed with: Clean wound with Normal Saline [name], [name] ([id number]) 2. Anesthetic Topical Lidocaine 4% cream to wound bed prior to debridement 4. Dressing Applied: Santyl Ointment 5. Secondary Dressing Applied Kerlix/Conform Non-Adherent pad 7. Secured with Paper tape Wound #9 (Right, Distal, Lateral Foot) 1. Cleansed with: Clean wound with Normal Saline 2. Anesthetic Topical Lidocaine 4% cream to wound bed prior to debridement 4. Dressing Applied: Santyl Ointment 5. Secondary Dressing Applied Kerlix/Conform Non-Adherent pad 7. Secured with Chief of Staff) Signed: 11/20/2016 5:12:22 PM By: Linton Ham MD Entered By: Linton Ham on 11/20/2016 11:16:00 Kotter, [name] ([id number]) -------------------------------------------------------------------------------- Multi-Disciplinary Care Plan Details Patient Name: Mault, Jazia V. Date of Service: 11/20/2016 10:15 AM Medical Record Number: [id number] Patient Account Number: [id number] Date of Birth/Sex: 07/08/1941 (76 y.o. Female) Treating RN: Afful, RN, BSN, Velva Harman Primary Care Tymeshia Awan: Beverlyn Roux Other Clinician: Referring Nathanial Arrighi: Beverlyn Roux Treating Antone Summons/Extender: Tito Dine in Treatment: 2 Active Inactive ` Abuse / Safety / Falls / Self Care Management Nursing Diagnoses: Impaired physical mobility Potential for falls Goals: Patient will remain injury free Date Initiated: 11/06/2016 Target Resolution Date: 12/30/2016 Goal Status: Active Patient/caregiver will verbalize understanding of skin care regimen Date Initiated: 11/06/2016 Target Resolution Date: 12/30/2016 Goal Status: Active Interventions: Assess fall risk on admission and as needed Treatment Activities: Patient referred to home care : 11/06/2016 Notes: ` Nutrition Nursing Diagnoses: Potential for alteratiion in Nutrition/Potential for imbalanced nutrition Goals: Patient/caregiver verbalizes understanding of need to maintain therapeutic glucose control per primary care physician Date Initiated: 11/06/2016 Target Resolution Date: 12/30/2016 Goal Status: Active Interventions: Provide education on elevated blood sugars and impact on wound healing Kaczorowski, Miracle V. ([id number]) Notes: ` Orientation to the Wound Care Program Nursing Diagnoses: Knowledge deficit related  to the wound healing center program Goals: Patient/caregiver will verbalize understanding of the [place] Date Initiated: 11/06/2016 Target Resolution Date: 12/30/2016 Goal Status:  Active Interventions: Provide education on orientation to the wound center Notes: ` Venous Leg Ulcer Nursing Diagnoses: Actual venous Insuffiency (use after diagnosis is confirmed) Knowledge deficit related to disease process and management Goals: Non-invasive venous studies are completed as ordered Date Initiated: 11/06/2016 Target Resolution Date: 12/30/2016 Goal Status: Active Patient/caregiver will verbalize understanding of disease process and disease management Date Initiated: 11/06/2016 Target Resolution Date: 12/30/2016 Goal Status: Active Interventions: Assess peripheral edema status every visit. Notes: ` Wound/Skin Impairment Nursing Diagnoses: Impaired tissue integrity Knowledge deficit related to smoking impact on wound healing Knowledge deficit related to ulceration/compromised skin integrity Goals: Kofman, Marliss V. ([id number]) Ulcer/skin breakdown will heal within 14 weeks Date Initiated: 11/06/2016 Target Resolution Date: 02/05/2017 Goal Status: Active Interventions: Assess ulceration(s) every visit Treatment Activities: Skin care regimen initiated : 11/06/2016 Notes: Electronic Signature(s) Signed: 11/20/2016 10:39:09 AM By: Regan Lemming BSN, RN Entered By: Regan Lemming on 11/20/2016 10:39:07 Mavity, Jaydah V. ([id number]) -------------------------------------------------------------------------------- Pain Assessment Details Patient Name: Wind, Adaiah V. Date of Service: 11/20/2016 10:15 AM Medical Record Number: [id number] Patient Account Number: [id number] Date of Birth/Sex: 07-10-1941 (76 y.o. Female) Treating RN: Afful, RN, BSN, Velva Harman Primary Care Feliza Diven: Beverlyn Roux Other Clinician: Referring Creedence Kunesh: Beverlyn Roux Treating Mackensie Pilson/Extender: Tito Dine in Treatment: 2 Active Problems Location of Pain Severity and Description of Pain Patient Has Paino Yes Site Locations Pain Location: Pain in Ulcers With Dressing Change: Yes Rate the  pain. Current Pain Level: 4 Character of Pain Describe the Pain: Tender Pain Management and Medication Current Pain Management: How does your wound impact your activities of daily livingo Sleep: Yes Bathing: Yes Appetite: Yes Relationship With Others: Yes Bladder Continence: Yes Emotions: Yes Bowel Continence: Yes Work: Yes Toileting: Yes Drive: Yes Dressing: Yes Hobbies: Yes Electronic Signature(s) Signed: 11/20/2016 4:47:06 PM By: Regan Lemming BSN, RN Entered By: Regan Lemming on 11/20/2016 10:14:15 [name], [name] ([id number]) -------------------------------------------------------------------------------- Patient/Caregiver Education Details Patient Name: Chaires, Lessa V. Date of Service: 11/20/2016 10:15 AM Medical Record Patient Account Number: [id number] Number: Treating RN: Baruch Gouty, RN, BSN, Velva Harman [birth year] [phone] y.o. Other Clinician: Date of Birth/Gender: Female) Treating ROBSON, [place] Primary Care Physician: Beverlyn Roux Physician/Extender: G Referring Physician: Dorthula Nettles in Treatment: 2 Education Assessment Education Provided To: Patient and Caregiver Education Topics Provided Elevated Blood Sugar/ Impact on Healing: Methods: Explain/Verbal Responses: Reinforcements needed Welcome To [facility] [place]: Methods: Explain/Verbal Electronic Signature(s) Signed: 11/20/2016 4:47:06 PM By: Regan Lemming BSN, RN Entered By: Regan Lemming on 11/20/2016 10:58:31 Savary, Dearia V. ([id number]) -------------------------------------------------------------------------------- Wound Assessment Details Patient Name: Ebron, Faythe V. Date of Service: 11/20/2016 10:15 AM Medical Record Number: [id number] Patient Account Number: [id number] Date of Birth/Sex: October 01, 1940 (76 y.o. Female) Treating RN: Afful, RN, BSN, Velva Harman Primary Care Marlan Steward: Beverlyn Roux Other Clinician: Referring Tanna Loeffler: Beverlyn Roux Treating Alvera Tourigny/Extender: Tito Dine in  Treatment: 2 Wound Status Wound Number: 5 Primary Diabetic Wound/Ulcer of the Lower Etiology: Extremity Wound Location: Right Lower Leg - Distal Wound Open Wounding Event: Gradually Appeared Status: Date Acquired: 09/09/2016 Comorbid Cataracts, Chronic sinus Weeks Of Treatment: 2 History: problems/congestion, Congestive Heart Clustered Wound: No Failure, Hypertension, Type II Diabetes Photos Photo Uploaded By: Regan Lemming on 11/20/2016 11:57:46 Wound Measurements Length: (cm) 0.3 Width: (cm) 0.3 Depth: (cm) 0.1 Area: (cm) 0.071 Volume: (cm) 0.007 % Reduction in Area: 96% % Reduction in  Volume: 96% Epithelialization: Large (67-100%) Tunneling: No Undermining: No Wound Description Classification: Grade 1 Foul Odor Aft Wound Margin: Flat and Intact Slough/Fibrin Exudate Amount: Small Harwood, Tamsyn V. ([id number]) er Cleansing: No o No Wound Bed Granulation Amount: Large (67-100%) Exposed Structure Granulation Quality: Pink, Friable Fascia Exposed: No Necrotic Amount: None Present (0%) Fat Layer (Subcutaneous Tissue) Exposed: Yes Tendon Exposed: No Muscle Exposed: No Joint Exposed: No Bone Exposed: No Periwound Skin Texture Texture Color No Abnormalities Noted: No No Abnormalities Noted: No Callus: No Atrophie Blanche: No Crepitus: No Cyanosis: No Excoriation: No Ecchymosis: No Induration: No Erythema: No Rash: No Hemosiderin Staining: Yes Scarring: Yes Mottled: No Pallor: No Moisture Rubor: No No Abnormalities Noted: No Dry / Scaly: No Temperature / Pain Maceration: No Temperature: No Abnormality Tenderness on Palpation: Yes Wound Preparation Ulcer Cleansing: Rinsed/Irrigated with Saline Topical Anesthetic Applied: Other: lidocaine 4%, Treatment Notes Wound #5 (Right, Distal Lower Leg) 1. Cleansed with: Clean wound with Normal Saline 2. Anesthetic Topical Lidocaine 4% cream to wound bed prior to debridement 4. Dressing Applied: Santyl  Ointment 5. Secondary Dressing Applied Kerlix/Conform Non-Adherent pad 7. Secured with Microbiologist) Signed: 11/20/2016 10:36:02 AM By: Regan Lemming BSN, RN Entered By: Regan Lemming on 11/20/2016 10:36:01 [name], [name] ([id number]) [name], [name] Kitchen ([id number]) -------------------------------------------------------------------------------- Wound Assessment Details Patient Name: Rinks, Denaly V. Date of Service: 11/20/2016 10:15 AM Medical Record Number: [id number] Patient Account Number: [id number] Date of Birth/Sex: 1940-12-30 (76 y.o. 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 ([id number]) Foul Odor After Cleansing: No Slough/Fibrino Yes Exudate Color: amber Wound Bed Granulation Amount: None Present (0%) Exposed Structure Necrotic Amount: Large (67-100%) Fascia Exposed: No Necrotic Quality: Adherent Slough Fat Layer (Subcutaneous Tissue) Exposed: Yes Tendon Exposed: No Muscle Exposed: No Joint Exposed:  No Bone Exposed: No Periwound Skin Texture Texture Color No Abnormalities Noted: No No Abnormalities Noted: No Callus: No Atrophie Blanche: No Crepitus: No Cyanosis: No Excoriation: No Ecchymosis: No Induration: No Erythema: No Rash: No Hemosiderin Staining: Yes Scarring: Yes Mottled: No Pallor: No Moisture Rubor: No No Abnormalities Noted: No Dry / Scaly: No Temperature / Pain Maceration: No Temperature: No Abnormality Tenderness on Palpation: Yes Wound Preparation Ulcer Cleansing: Rinsed/Irrigated with Saline Topical Anesthetic Applied: Other: lidocaine 4%, Treatment Notes Wound #6 (Right, Proximal Lower Leg) 1. Cleansed with: Clean wound with Normal Saline 2. Anesthetic Topical Lidocaine 4% cream to wound bed prior to debridement 4. Dressing Applied: Santyl Ointment 5. Secondary Dressing Applied Kerlix/Conform Non-Adherent pad 7. Secured with Microbiologist) Signed: 11/20/2016 10:36:55 AM By: Regan Lemming BSN, RN Werst, Jodie V. ([id number]) Entered By: Regan Lemming on 11/20/2016 10:36:54 Melgoza, Camelia [name] ([id number]) -------------------------------------------------------------------------------- Wound Assessment Details Patient Name: Warn, Rmoni V. Date of Service:  11/20/2016 10:15 AM Medical Record Number: [id number] Patient Account Number: [id number] Date of Birth/Sex: 06/15/1941 (76 y.o. Female) Treating RN: Afful, RN, BSN, Administrator, sports Primary Care Aribella Vavra: Beverlyn Roux Other Clinician: Referring Jerriann Schrom: Beverlyn Roux Treating Brennen Camper/Extender: Tito Dine in Treatment: 2 Wound Status Wound Number: 7 Primary Diabetic Wound/Ulcer of the Lower Etiology: Extremity Wound Location: Right Metatarsal head first - Dorsal Wound Open Status: Wounding Event: Gradually Appeared Comorbid Cataracts, Chronic sinus Date Acquired: 08/06/2013 History: problems/congestion, Congestive Heart Weeks Of Treatment: 2 Failure, Hypertension, Type II  Diabetes Clustered Wound: No Photos Photo Uploaded By: Regan Lemming on 11/20/2016 12:00:50 Wound Measurements Length: (cm) 2.2 Width: (cm) 1 Depth: (cm) 0.1 Area: (cm) 1.728 Volume: (cm) 0.173 % Reduction in Area: -46.7% % Reduction in Volume: 51% Epithelialization: None Tunneling: No Undermining: No Wound Description Classification: Grade 2 Wound Margin: Flat and Intact Exudate Amount: Large Exudate Type: Serous Georgiou, Daylyn V. ([id number]) Foul Odor After Cleansing: No Slough/Fibrino Yes Exudate Color: amber Wound Bed Granulation Amount: None Present (0%) Exposed Structure Necrotic Amount: Large (67-100%) Fascia Exposed: No Necrotic Quality: Adherent Slough Fat Layer (Subcutaneous Tissue) Exposed: Yes Tendon Exposed: No Muscle Exposed: No Joint Exposed: No Bone Exposed: No Periwound Skin Texture Texture Color No Abnormalities Noted: No No Abnormalities Noted: No Scarring: Yes Ecchymosis: Yes Hemosiderin Staining: Yes Moisture Mottled: Yes No Abnormalities Noted: No Maceration: Yes Temperature / Pain Temperature: No Abnormality Tenderness on Palpation: Yes Wound Preparation Ulcer Cleansing: Rinsed/Irrigated with Saline Topical Anesthetic Applied: Other: lidociane 4%, Treatment Notes Wound #7 (Right, Dorsal Metatarsal head first) 1. Cleansed with: Clean wound with Normal Saline 2. Anesthetic Topical Lidocaine 4% cream to wound bed prior to debridement 4. Dressing Applied: Santyl Ointment 5. Secondary Dressing Applied Kerlix/Conform Non-Adherent pad 7. Secured with Microbiologist) Signed: 11/20/2016 10:37:33 AM By: Regan Lemming BSN, RN Entered By: Regan Lemming on 11/20/2016 10:37:32 Meares, Monalisa [name] ([id number]) -------------------------------------------------------------------------------- Wound Assessment Details Patient Name: Delamater, Naleah V. Date of Service: 11/20/2016 10:15 AM Medical Record Number: [id number] Patient Account  Number: [id number] Date of Birth/Sex: [birth year] (76 y.o. 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, Hypertension, Type II Diabetes Photos Photo Uploaded By: Regan Lemming on 11/20/2016 12:00:51 Wound Measurements Length: (cm) 0.3 Width: (cm) 0.5 Depth: (cm) 0.1 Area: (cm) 0.118 Volume: (cm) 0.012 % Reduction in Area: 84.4% % Reduction in Volume: 84% Epithelialization: None Tunneling: No Undermining: No Wound Description Classification: Unable to visualize wound bed Foul Odor Aft Wound Margin: Flat and Intact Slough/Fibrin Exudate Amount: None Present er Cleansing: No o No Wound Bed Granulation Amount: None Present (0%) Exposed Structure Necrotic Amount: Large (67-100%) Fascia Exposed: No Necrotic Quality: Eschar Fat Layer (Subcutaneous Tissue) Exposed: No Tendon Exposed: No Muscle Exposed: No Joint Exposed: No Rufo, Ermalinda V. ([id number]) Bone Exposed: No Limited to Skin Breakdown Periwound Skin Texture Texture Color No Abnormalities Noted: No No Abnormalities Noted: No Moisture Temperature / Pain No Abnormalities Noted: No Temperature: No Abnormality Dry / Scaly: Yes Tenderness on Palpation: Yes Wound Preparation Ulcer Cleansing: Rinsed/Irrigated with Saline Topical Anesthetic Applied: Other: lidocaine 4%, Treatment Notes Wound #8 (Right, Dorsal Toe Third) 1.  Cleansed with: Clean wound with Normal Saline 2. Anesthetic Topical Lidocaine 4% cream to wound bed  prior to debridement 4. Dressing Applied: Santyl Ointment 5. Secondary Dressing Applied Kerlix/Conform Non-Adherent pad 7. Secured with Microbiologist) Signed: 11/20/2016 10:38:07 AM By: Regan Lemming BSN, RN Entered By: Regan Lemming on 11/20/2016 10:38:06 Bieler, Gigi [name] ([id number]) -------------------------------------------------------------------------------- Wound Assessment Details Patient Name: Foskey, Patsey V. Date of Service: 11/20/2016 10:15 AM Medical Record Number: [id number] Patient Account Number: [id number] Date of Birth/Sex: 09/04/1940 (76 y.o. 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, Hypertension, Type II Diabetes Wound Measurements Length: (cm) 2.5 Width: (cm) 0.5 Depth: (cm) 0.2 Area: (cm) 0.982 Volume: (cm) 0.196 % Reduction in Area: 0% % Reduction in Volume: 0% Epithelialization: Small (1-33%) Tunneling: No Undermining: No Wound Description Classification: Grade 1 Wound Margin: Indistinct, nonvisible Exudate Amount: Small Exudate Type: Serous Exudate Color: amber Foul Odor After Cleansing: No Slough/Fibrino Yes Wound Bed Granulation Amount: None Present (0%) Exposed Structure Necrotic Amount: Large (67-100%) Fascia Exposed: No Necrotic Quality: Adherent Slough Fat Layer (Subcutaneous Tissue) Exposed: Yes Tendon Exposed: No Muscle Exposed: No Joint Exposed: No Bone Exposed: No Periwound Skin Texture Texture Color No  Abnormalities Noted: No No Abnormalities Noted: No Callus: No Atrophie Blanche: No Crepitus: No Cyanosis: No Excoriation: No Ecchymosis: No Induration: No Erythema: No Raap, Jeriann V. ([id number]) Rash: No Hemosiderin Staining: Yes Scarring: No Mottled: No Pallor: No Moisture Rubor: No No Abnormalities Noted: No Dry / Scaly: No Temperature / Pain Maceration: No Temperature: No Abnormality Tenderness on Palpation: Yes Wound Preparation Ulcer Cleansing: Rinsed/Irrigated with Saline Topical Anesthetic Applied: Other: lidocaine 4%, Treatment Notes Wound #9 (Right, Distal, Lateral Foot) 1. Cleansed with: Clean wound with Normal Saline 2. Anesthetic Topical Lidocaine 4% cream to wound bed prior to debridement 4. Dressing Applied: Santyl Ointment 5. Secondary Dressing Applied Kerlix/Conform Non-Adherent pad 7. Secured with Microbiologist) Signed: 11/20/2016 10:38:40 AM By: Regan Lemming BSN, RN Entered By: Regan Lemming on 11/20/2016 10:38:39 Morten, Breyon [name] ([id number]) -------------------------------------------------------------------------------- Vitals Details Patient Name: Strohecker, Elsa V. Date of Service: 11/20/2016 10:15 AM Medical Record Number: [id number] Patient Account Number: [id number] Date of Birth/Sex: April 17, 1941 (76 y.o. Female) Treating RN: Afful, RN, BSN, Velva Harman Primary Care Jayana Kotula: Beverlyn Roux Other Clinician: Referring Kaelah Hayashi: Beverlyn Roux Treating Maddux First/Extender: Tito Dine in Treatment: 2 Vital Signs Time Taken: 10:13 Temperature (F): 97.6 Height (in): 64 Pulse (bpm): 72 Weight (lbs): 200 Respiratory Rate (breaths/min): 17 Body Mass Index (BMI): 34.3 Blood Pressure (mmHg): 153/53 Reference Range: 80 - 120 mg / dl Electronic Signature(s) Signed: 11/20/2016 4:47:06 PM By: Regan Lemming BSN, RN Entered By: Regan Lemming on 11/20/2016 10:14:41

## 2016-11-21 NOTE — Progress Notes (Signed)
ALAZIA, CROCKET ([id number]) Visit Report for 11/20/2016 Chief Complaint Document Details Patient Name: Chalfin, [name] Date of Service: 11/20/2016 10:15 AM Medical Record Patient Account Number: [id number] Number: Treating RN: Baruch Gouty, RN, BSN, Velva Harman 11/01/1940 [phone] y.o. Other Clinician: Date of Birth/Sex: Female) Treating Ashanti Littles Primary Care Provider: Beverlyn Roux Provider/Extender: G Referring Provider: Dorthula Nettles in Treatment: 2 Information Obtained from: Patient Chief Complaint The patient is here for initial evaluation of RLE and Right foot/toe ulcers Electronic Signature(s) Signed: 11/20/2016 5:12:22 PM By: Linton Ham MD Entered By: Linton Ham on 11/20/2016 11:16:12 Dobis, Crislyn [name] ([id number]) -------------------------------------------------------------------------------- HPI Details Patient Name: [name], [name] Date of Service: 11/20/2016 10:15 AM Medical Record Patient Account Number: [id number] Number: Treating RN: Baruch Gouty, RN, BSN, Velva Harman August 17, 1940 [phone] y.o. Other Clinician: Date of Birth/Sex: Female) Treating Baker Kogler Primary Care Provider: Beverlyn Roux Provider/Extender: G Referring Provider: Dorthula Nettles in Treatment: 2 History of Present Illness HPI Description: 08/13/16: This is a 76 year old woman who came predominantly for review of 3 cm in diameter circular wound to the left anterior lateral leg. She was in the ER on 08/01/16 I reviewed their notes. There was apparently pus coming out of the wound at that time and the patient arrived requesting debridement which they don't do in the emergency room. Nevertheless I can't see that they did any x-rays. There were no cultures done. She is a type II diabetic and I a note after the patient was in the clinic that she had a bypass graft from the popliteal to the tibial on the right on 02/28/16. She also had a right greater saphenous vein harvest on the same date for  arterial bypass. She is going to have vascular studies including ABIs T ABIs on the right on 08/28/16. The patient's surgery was on 02/28/16 by Dr. Vallarie Mare she had a right below the knee popliteal artery to peroneal artery bypass with reverse greater saphenous vein and an endarterectomy of the mid segment peroneal artery. Postoperatively she had a strong mild monophasic peroneal signal with a pink foot. It would appear that the patient is had some nonhealing in the surgical saphenous vein harvest site on the left leg. Surprisingly looking [facility] I cannot see much information about this at all. Dr. Lucious Groves notes from 05/29/16 show that the patient's wounds "are not healed" the right first metatarsal wound healed but then opened back up. 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 At that point the patient had a wound VAC to her right calf with regards to the wounds on her dorsal right toe would appear that these are felt to be arterial wounds. She has had surgery on the metatarsal phalangeal in 2015 I Dr. Doran Durand secondary to a right metatarsal phalangeal joint fracture. She is apparently had discoloration around this area since then. 08/28/16; patient arrives with her wounds in much the same condition. The linear vein harvest site and the circular wound below it which I think was a blister. She also has to probing holes in her right great toe and a necrotic eschar on the right second toe. Because of these being arterial wounds I reduced her compression from 3-2 layers this seems to of done satisfactorily she has not had any problems. I cannot see that she is actually had an x-ray ====== 11/06/16 the patient comes in for evaluation of her right lower extremity ulcers. She was  here in January 2018 for 2 visits subsequently ended up in the hospital with pneumonia and then to rehabilitation.  She has now been discharged from rehabilitation and is home. She has multiple ulcerations to her right lower extremity including the foot and toes. She does have home health in place and they have been placing alginate to the ulcers. She is followed by Dr. Bridgett Larsson of vascular medicine. 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 She recently saw him on 3/23. In office ABIs were: Schappell, KEYON WINNICK. ([id number]) Right 0.48 with monophasic flow to the DP, PT, peroneal Left 0.63 with monophasic flow to the DP and PT Her arterial studies indicated a patent right below knee popliteal to peroneal bypass She had an MRI in February 2008 that was negative frosty myelitis but this showed general soft tissue edema in the right foot and lower extremity concerning for cellulitis She is a diabetic, managed with insulin. Her hemoglobin A1c in December 2017 was 8.4 which is a trend up from previous levels. She had blood work in February 2018 which revealed an albumin of 2.6 this appears to be relatively acute as an albumin in November 2017 was 3.7 11/13/16; this is a patient I have not seen since February who is readmitted to our clinic last week. 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 Bridgett Larsson. I have reviewed Dr. Lianne Moris notes from March/23/18. Doppler ABI on that date showed an ABI on the right of 0.48 and on the left of 0.63. Dorsalis pedis waveforms were monophasic bilaterally. There was no waveforms detected at the posterior tibial on the right, monophasic on the left. Dr. Lianne Moris comments were that this patient would have follow-up vascular studies in 3 months including ABIs and right lower extremity arterial duplex. She had an MRI in February that was negative for osteomyelitis but showed generalized edema in the foot. Last albumin I see was in January at 3.4 we have been using  Santyl to the 4 wounds on the right leg the patient is noted today to have widespread edema well up towards her groin this is pitting 2-3+. 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 Normal ejection fraction. 11/20/16; patient has a follow-up appointment with Dr. Bridgett Larsson on April 23. She is still complaining of a lot of pain in the right foot and right leg. It is not clear to me that this is at all positional however I think it is clear claudication with minimal activity perhaps at rest. At our suggestion she is return to her primary physician's office tomorrow with regards to her pitting lower extremity bilateral edema that I reviewed in detail last week Electronic Signature(s) Signed: 11/20/2016 5:12:22 PM By: Linton Ham MD Entered By: Linton Ham on 11/20/2016 11:42:25 Ericsson, Tujuana [name] ([id number]) -------------------------------------------------------------------------------- Physical Exam Details Patient Name: Whiteley, Lamisha V. Date of Service: 11/20/2016 10:15 AM Medical Record Patient Account Number: [id number] Number: Treating RN: Baruch Gouty, RN, BSN, Velva Harman [birth year] [phone] y.o. Other Clinician: Date of Birth/Sex: Female) Treating Keelia Graybill Primary Care Provider: Beverlyn Roux Provider/Extender: G Referring Provider: Dorthula Nettles in Treatment: 2 Constitutional Patient is hypertensive.. Pulse regular and within target range for patient.[name] Respirations regular, non-labored and within target range.. Temperature is normal and within the target range for the patient.. Patient's appearance is neat and clean.  Appears in no acute distress. Well nourished and well developed.. Eyes Conjunctivae clear. No discharge.. Neck JVP is not elevated no masses. Respiratory Respiratory effort is easy and symmetric bilaterally. Rate is normal at rest and on room air.. Bilateral breath sounds are clear and equal in all lobes  with no wheezes, rales or rhonchi.. Cardiovascular 3 had a 6 systolic ejection murmur radiating faintly to the carotids no S4. Pedal pulses absent bilaterally.. Edema present in both extremities. 2+ no evidence of a DVT. Gastrointestinal (GI) Abdomen is soft and non-distended without masses or tenderness. Bowel sounds active in all quadrants.. Lymphatic None palpable in the popliteal or inguinal area. Integumentary (Hair, Skin) No rash. Psychiatric No evidence of depression, anxiety, or agitation. Calm, cooperative, and communicative. Appropriate interactions and affect.. Notes 11/20/16; would exam; the patient has 2 small wounds on the right leg, one in the vein harvest site on the right one below it. Both of these wounds looks somewhat better and smaller today. She has 3 wounds on the right foot at the base of the right first toe over the dorsal first metatarsophalangeal. This has surface eschar I did not debridement base of the third toe and lateral aspect of the right fifth metatarsal head. We're using Santyl to all these areas. Out of concern for her claudication at rest symptoms we have only been using a Kerlix wrap whereas otherwise we might use more aggressive compression Electronic Signature(s) Signed: 11/20/2016 5:12:22 PM By: Linton Ham MD Entered By: Linton Ham on 11/20/2016 11:12:16 Verrilli, MONTINE HIGHT ([id number]) Mendia, Jozelynn [name] ([id number]) -------------------------------------------------------------------------------- Physician Orders Details Patient Name: Kersh, Ramya V. Date of Service: 11/20/2016 10:15 AM Medical Record Patient Account Number: [id number] Number: Treating RN: Baruch Gouty, RN, BSN, Velva Harman 1941-05-06 [phone] y.o. Other Clinician: Date of Birth/Sex: Female) Treating Dallen Bunte Primary Care Provider: Beverlyn Roux Provider/Extender: G Referring Provider: Dorthula Nettles in Treatment: 2 Verbal / Phone Orders: No Diagnosis  Coding Anesthetic Wound #5 Right,Distal Lower Leg o Topical Lidocaine 4% cream applied to wound bed prior to debridement Wound #6 Right,Proximal Lower Leg o Topical Lidocaine 4% cream applied to wound bed prior to debridement Wound #7 Right,Dorsal Metatarsal head first o Topical Lidocaine 4% cream applied to wound bed prior to debridement Wound #8 Right,Dorsal Toe Third o Topical Lidocaine 4% cream applied to wound bed prior to debridement Wound #9 Right,Distal,Lateral Foot o Topical Lidocaine 4% cream applied to wound bed prior to debridement Primary Wound Dressing Wound #5 Right,Distal Lower Leg o Santyl Ointment - If too expensive, use Medihoney or Manuka Honey. Wound #6 Right,Proximal Lower Leg o Santyl Ointment - If too expensive, use Medihoney or Manuka Honey. Wound #7 Right,Dorsal Metatarsal head first o Santyl Ointment - If too expensive, use Medihoney or Manuka Honey. Wound #8 Right,Dorsal Toe Third o Santyl Ointment - If too expensive, use Medihoney or Manuka Honey. Wound #9 Right,Distal,Lateral Foot o Santyl Ointment - If too expensive, use Medihoney or Manuka Honey. Secondary Dressing Wound #5 Right,Distal Lower Leg o Conform/Kerlix Leaman, Mija V. ([id number]) o Non-adherent pad Wound #6 Right,Proximal Lower Leg o Conform/Kerlix o Non-adherent pad Wound #7 Right,Dorsal Metatarsal head first o Conform/Kerlix o Non-adherent pad Wound #8 Right,Dorsal Toe Third o Conform/Kerlix o Non-adherent pad Wound #9 Right,Distal,Lateral Foot o Conform/Kerlix o Non-adherent pad Dressing Change Frequency Wound #5 Right,Distal Lower Leg o Change dressing every day. Wound #6 Right,Proximal Lower Leg o Change dressing every day. Wound #7 Right,Dorsal Metatarsal head first o Change dressing every day. Wound #8 Right,Dorsal  Toe Third o Change dressing every day. Wound #9 Right,Distal,Lateral Foot o Change dressing every  day. Follow-up Appointments Wound #5 Right,Distal Lower Leg o Return Appointment in 1 week. Wound #6 Right,Proximal Lower Leg o Return Appointment in 1 week. Wound #7 Right,Dorsal Metatarsal head first o Return Appointment in 1 week. Wound #8 Right,Dorsal Toe Third o Return Appointment in 1 week. Wound #9 Right,Distal,Lateral Foot o Return Appointment in 1 week. Lubinski, Chastity V. ([id number]) Edema Control Wound #5 Right,Distal Lower Leg o Elevate legs to the level of the heart and pump ankles as often as possible Wound #6 Right,Proximal Lower Leg o Elevate legs to the level of the heart and pump ankles as often as possible Wound #7 Right,Dorsal Metatarsal head first o Elevate legs to the level of the heart and pump ankles as often as possible Wound #8 Right,Dorsal Toe Third o Elevate legs to the level of the heart and pump ankles as often as possible Wound #9 Right,Distal,Lateral Foot o Elevate legs to the level of the heart and pump ankles as often as possible Home Health Wound #5 Right,Distal Lower Leg o [place] [facility] twice weekly, [facility]-Wednesday, teach family member to change on off day. o Home Health Nurse may visit PRN to address patientos wound care needs. o FACE TO FACE ENCOUNTER: MEDICARE and MEDICAID PATIENTS: I certify that this patient is under my care and that I had a face-to-face encounter that meets the physician face-to-face encounter requirements with this patient on this date. The encounter with the patient was in whole or in part for the following MEDICAL CONDITION: (primary reason for [place]) MEDICAL NECESSITY: I certify, that based on my findings, NURSING services are a medically necessary home health service. HOME BOUND STATUS: I certify that my clinical findings support that this patient is homebound (i.e., Due to illness or injury, pt requires aid of supportive devices such as crutches,  cane, wheelchairs, walkers, the use of special transportation or the assistance of another person to leave their place of residence. There is a normal inability to leave the home and doing so requires considerable and taxing effort. Other absences are for medical reasons / religious services and are infrequent or of short duration when for other reasons). o If current dressing causes regression in wound condition, may D/C ordered dressing product/s and apply Normal Saline Moist Dressing daily until next [place] / Other MD appointment. [place] of regression in wound condition at [phone]. o Please direct any NON-WOUND related issues/requests for orders to patient's Primary Care Physician Wound #6 Right,Proximal Lower Leg o [place] [facility] - [facility] twice weekly, [facility]-Wednesday, teach family member to change on off day. o Home Health Nurse may visit PRN to address patientos wound care needs. o FACE TO FACE ENCOUNTER: MEDICARE and MEDICAID PATIENTS: I certify that this patient is under my care and that I had a face-to-face encounter that meets the physician face-to-face encounter requirements with this patient on this date. The encounter with the patient was in whole or in part for the following MEDICAL CONDITION: (primary reason for [place]) MEDICAL NECESSITY: I certify, that based on my findings, NURSING services are a medically Gob[place], Alzena V. ([id number]) necessary home health service. HOME BOUND STATUS: I certify that my clinical findings support that this patient is homebound (i.e., Due to illness or injury, pt requires aid of supportive devices such as crutches, cane, wheelchairs, walkers, the use of  special transportation or the assistance of another person to leave their place of residence. There is a normal inability to leave the home and doing so requires considerable and taxing effort. Other absences  are for medical reasons / religious services and are infrequent or of short duration when for other reasons). o If current dressing causes regression in wound condition, may D/C ordered dressing product/s and apply Normal Saline Moist Dressing daily until next [place] / Other MD appointment. [place] of regression in wound condition at [phone]. o Please direct any NON-WOUND related issues/requests for orders to patient's Primary Care Physician Wound #7 Right,Dorsal Metatarsal head first o [place] [facility] - [facility] twice weekly, [facility] Care Center-Wednesday, teach family member to change on off day. o Home Health Nurse may visit PRN to address patientos wound care needs. o FACE TO FACE ENCOUNTER: MEDICARE and MEDICAID PATIENTS: I certify that this patient is under my care and that I had a face-to-face encounter that meets the physician face-to-face encounter requirements with this patient on this date. The encounter with the patient was in whole or in part for the following MEDICAL CONDITION: (primary reason for [place]) MEDICAL NECESSITY: I certify, that based on my findings, NURSING services are a medically necessary home health service. HOME BOUND STATUS: I certify that my clinical findings support that this patient is homebound (i.e., Due to illness or injury, pt requires aid of supportive devices such as crutches, cane, wheelchairs, walkers, the use of special transportation or the assistance of another person to leave their place of residence. There is a normal inability to leave the home and doing so requires considerable and taxing effort. Other absences are for medical reasons / religious services and are infrequent or of short duration when for other reasons). o If current dressing causes regression in wound condition, may D/C ordered dressing product/s and apply Normal Saline Moist Dressing daily until next [place] / Other MD appointment. [place] of regression in wound condition at [phone]. o Please direct any NON-WOUND related issues/requests for orders to patient's Primary Care Physician Wound #8 Right,Dorsal Toe [place] [facility] - [facility] twice weekly, [facility]-Wednesday, teach family member to change on off day. o Home Health Nurse may visit PRN to address patientos wound care needs. o FACE TO FACE ENCOUNTER: MEDICARE and MEDICAID PATIENTS: I certify that this patient is under my care and that I had a face-to-face encounter that meets the physician face-to-face encounter requirements with this patient on this date. The encounter with the patient was in whole or in part for the following MEDICAL CONDITION: (primary reason for [place]) MEDICAL NECESSITY: I certify, that based on my findings, NURSING services are a medically necessary home health service. HOME BOUND STATUS: I certify that my clinical findings support that this patient is homebound (i.e., Due to illness or injury, pt requires aid of supportive devices such as crutches, cane, wheelchairs, walkers, the use of special transportation or the assistance of another person to leave their place of residence. There is a normal inability to leave the home and doing so requires considerable and taxing effort. Other [place]man, Lehua V. ([id number]) absences are for medical reasons / religious services and are infrequent or of short duration when for other reasons). o If current dressing causes regression in wound condition, may D/C ordered dressing product/s and apply Normal Saline Moist Dressing daily until next [place] / Other  MD appointment. [place] of regression in wound condition at [phone]. o Please direct any NON-WOUND related issues/requests for orders to patient's Primary Care Physician Wound #9 [place] [facility] - [facility] twice weekly, Wound Care Center-Wednesday, teach family member to change on off day. o Home Health Nurse may visit PRN to address patientos wound care needs. o FACE TO FACE ENCOUNTER: MEDICARE and MEDICAID PATIENTS: I certify that this patient is under my care and that I had a face-to-face encounter that meets the physician face-to-face encounter requirements with this patient on this date. The encounter with the patient was in whole or in part for the following MEDICAL CONDITION: (primary reason for [place]) MEDICAL NECESSITY: I certify, that based on my findings, NURSING services are a medically necessary home health service. HOME BOUND STATUS: I certify that my clinical findings support that this patient is homebound (i.e., Due to illness or injury, pt requires aid of supportive devices such as crutches, cane, wheelchairs, walkers, the use of special transportation or the assistance of another person to leave their place of residence. There is a normal inability to leave the home and doing so requires considerable and taxing effort. Other absences are for medical reasons / religious services and are infrequent or of short duration when for other reasons). o If current dressing causes regression in wound condition, may D/C ordered dressing product/s and apply Normal Saline Moist Dressing daily until next [place] / Other MD appointment. [place] of regression in wound condition at [phone]. o Please direct any NON-WOUND related issues/requests for orders to patient's Primary Care Physician Medications-please add to medication list. Wound #5 Right,Distal Lower Leg o Santyl Enzymatic Ointment Wound #6 Right,Proximal Lower Leg o Santyl Enzymatic Ointment Wound #7 Right,Dorsal Metatarsal head first o Santyl Enzymatic Ointment Wound #8 Right,Dorsal Toe Third o Santyl Enzymatic  Ointment Wound #9 Right,Distal,Lateral Foot o Santyl Enzymatic Ointment Electronic Signature(s) Signed: 11/20/2016 4:47:06 PM By: Regan Lemming BSN, RN PA, TENNANT ([id number]) Signed: 11/20/2016 5:12:22 PM By: Linton Ham MD Entered By: Regan Lemming on 11/20/2016 10:43:04 Leija, Onnika [name] ([id number]) -------------------------------------------------------------------------------- Problem List Details Patient Name: Klinge, Lavren V. Date of Service: 11/20/2016 10:15 AM Medical Record Patient Account Number: [id number] Number: Treating RN: Baruch Gouty, RN, BSN, Velva Harman May 31, 1941 [phone] y.o. 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 ([id number]) -------------------------------------------------------------------------------- Progress Note Details Patient Name: Lucy, Mattilynn V. Date of Service: 11/20/2016 10:15 AM Medical Record Patient Account Number:  [id number] Number: Treating RN: Baruch Gouty, RN, BSN, Velva Harman 13-Oct-1940 [phone] y.o. Other Clinician: Date of Birth/Sex: Female) Treating Mamie Hundertmark, [place] Primary Care Provider:  Beverlyn Roux Provider/Extender: G Referring Provider: Dorthula Nettles in Treatment: 2 Subjective Chief Complaint Information obtained from Patient The patient is here for initial evaluation of RLE and Right foot/toe ulcers History of Present Illness (HPI) 08/13/16: This is a 76 year old woman who came predominantly for review of 3 cm in diameter circular wound to the left anterior lateral leg. She was in the ER on 08/01/16 I reviewed their notes. There was apparently pus coming out of the wound at that time and the patient arrived requesting debridement which they don't do in the emergency room. Nevertheless I can't see that they did any x-rays. There were no cultures done. She is a type II diabetic and I a note after the patient was in the clinic that she had a bypass graft from the popliteal to the tibial on the right on 02/28/16. She also had a right greater saphenous vein harvest on the same date for arterial bypass. She is going to have vascular studies including ABIs T ABIs on the right on 08/28/16. The patient's surgery was on 02/28/16 by Dr. Vallarie Mare she had a right below the knee popliteal artery to peroneal artery bypass with reverse greater saphenous vein and an endarterectomy of the mid segment peroneal artery. Postoperatively she had a strong mild monophasic peroneal signal with a pink foot. It would appear that the patient is had some nonhealing in the surgical saphenous vein harvest site on the left leg. Surprisingly looking through [facility] I cannot see much information about this at all. Dr. Lucious Groves notes from 05/29/16 show that the patient's wounds "are not healed" the right first metatarsal wound healed but then opened back up. 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 At that point the patient had a wound VAC to her right calf with regards to the wounds on her dorsal right toe would appear that these are felt to be arterial wounds. She has had surgery on the metatarsal phalangeal in 2015 I Dr. Doran Durand secondary to a right metatarsal phalangeal joint fracture. She is apparently had discoloration around this area since then. 08/28/16; patient arrives with her wounds in much the same condition. The linear vein harvest site and the circular wound below it which I think was a blister. She also has to probing holes in her right great toe and a necrotic eschar on the right second toe. Because of these being arterial wounds I reduced her compression from 3-2 layers this seems to of done satisfactorily she has not had any problems. I cannot see that she is actually had an x-ray ====== 11/06/16 the patient comes in for evaluation of her right lower extremity ulcers. She was here in January 2018 for 2 visits subsequently ended up in the hospital with pneumonia and then to rehabilitation. She has now been [place], [place] ([id number]) discharged from rehabilitation and is home. She has multiple ulcerations to her right lower extremity including the foot and toes. She does have home health in place and they have been placing alginate to the ulcers. She is followed by Dr. Bridgett Larsson of vascular medicine. 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 She recently saw him on 3/23. In office ABIs were: Right 0.48 with monophasic flow to the DP, PT, peroneal Left 0.63 with monophasic flow to the DP and PT Her arterial studies indicated a  patent right below knee popliteal to peroneal bypass She had an MRI in February 2008 that was negative frosty myelitis but this showed general soft tissue edema in the right foot and lower extremity  concerning for cellulitis She is a diabetic, managed with insulin. Her hemoglobin A1c in December 2017 was 8.4 which is a trend up from previous levels. She had blood work in February 2018 which revealed an albumin of 2.6 this appears to be relatively acute as an albumin in November 2017 was 3.7 11/13/16; this is a patient I have not seen since February who is readmitted to our clinic last week. 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 Bridgett Larsson. I have reviewed Dr. Lianne Moris notes from March/23/18. Doppler ABI on that date showed an ABI on the right of 0.48 and on the left of 0.63. Dorsalis pedis waveforms were monophasic bilaterally. There was no waveforms detected at the posterior tibial on the right, monophasic on the left. Dr. Lianne Moris comments were that this patient would have follow-up vascular studies in 3 months including ABIs and right lower extremity arterial duplex. She had an MRI in February that was negative for osteomyelitis but showed generalized edema in the foot. Last albumin I see was in January at 3.4 we have been using Santyl to the 4 wounds on the right leg the patient is noted today to have widespread edema well up towards her groin this is pitting 2-3+. 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 Normal ejection fraction. 11/20/16; patient has a follow-up appointment with Dr. Bridgett Larsson on April 23. She is still complaining of a lot of pain in the right foot and right leg. It is not clear to me that this is at all positional however I think it is clear claudication with minimal activity perhaps at rest. At our suggestion she is return to her primary physician's office tomorrow with regards to her pitting lower extremity bilateral edema that I reviewed in detail last week Objective Constitutional Patient is hypertensive.. Pulse regular and within target range for  patient.[name] Respirations regular, non-labored and within target range.. Temperature is normal and within the target range for the patient.. Patient's appearance is neat and clean. Appears in no acute distress. Well nourished and well developed.. Vitals Time Taken: 10:13 AM, Height: 64 in, Weight: 200 lbs, BMI: 34.3, Temperature: 97.6 F, Pulse: 72 bpm, Respiratory Rate: 17 breaths/min, Blood Pressure: 153/53 mmHg. Eyes Conjunctivae clear. No discharge.. Clingenpeel, Mckinna V. ([id number]) Neck JVP is not elevated no masses. Respiratory Respiratory effort is easy and symmetric bilaterally. Rate is normal at rest and on room air.. Bilateral breath sounds are clear and equal in all lobes with no wheezes, rales or rhonchi.. Cardiovascular 3 had a 6 systolic ejection murmur radiating faintly to the carotids no S4. Pedal pulses absent bilaterally.. Edema present in both extremities. 2+ no evidence of a DVT. Gastrointestinal (GI) Abdomen is soft and non-distended without masses or tenderness. Bowel sounds active in all quadrants.. Lymphatic None palpable in the popliteal or inguinal area. Psychiatric No evidence of depression, anxiety, or agitation. Calm, cooperative, and communicative. Appropriate interactions and affect.. General Notes: 11/20/16; would exam; the patient has 2 small wounds on the right leg, one in the vein harvest site on the right one below it. Both of these wounds looks somewhat better and smaller today. She has 3 wounds on the right foot at  the base of the right first toe over the dorsal first metatarsophalangeal. This has surface eschar I did not debridement base of the third toe and lateral aspect of the right fifth metatarsal head. We're using Santyl to all these areas. Out of concern for her claudication at rest symptoms we have only been using a Kerlix wrap whereas otherwise we might use more aggressive compression Integumentary (Hair, Skin) No rash. Wound #5 status is Open.  Original cause of wound was Gradually Appeared. The wound is located on the Right,Distal Lower Leg. The wound measures 0.3cm length x 0.3cm width x 0.1cm depth; 0.071cm^2 area and 0.007cm^3 volume. There is Fat Layer (Subcutaneous Tissue) Exposed exposed. There is no tunneling or undermining noted. There is a small amount of drainage noted. The wound margin is flat and intact. There is large (67-100%) pink, friable granulation within the wound bed. There is no necrotic tissue within the wound bed. The periwound skin appearance exhibited: Scarring, Hemosiderin Staining. The periwound skin appearance did not exhibit: Callus, Crepitus, Excoriation, Induration, Rash, Dry/Scaly, Maceration, Atrophie Blanche, Cyanosis, Ecchymosis, Mottled, Pallor, Rubor, Erythema. Periwound temperature was noted as No Abnormality. The periwound has tenderness on palpation. Wound #6 status is Open. Original cause of wound was Gradually Appeared. The wound is located on the Right,Proximal Lower Leg. The wound measures 1cm length x 0.3cm width x 0.1cm depth; 0.236cm^2 area and 0.024cm^3 volume. There is Fat Layer (Subcutaneous Tissue) Exposed exposed. There is no tunneling or undermining noted. There is a medium amount of serous drainage noted. The wound margin is flat and intact. There is no granulation within the wound bed. There is a large (67-100%) amount of necrotic tissue within the wound bed including Adherent Slough. The periwound skin appearance exhibited: Scarring, Hemosiderin Staining. The periwound skin appearance did not exhibit: Callus, Crepitus, Excoriation, Induration, Rash, Dry/Scaly, Maceration, Atrophie Blanche, Cyanosis, Ecchymosis, Mottled, Pallor, Rubor, Grosch, Kineta V. ([id number]) Erythema. Periwound temperature was noted as No Abnormality. The periwound has tenderness on palpation. Wound #7 status is Open. Original cause of wound was Gradually Appeared. The wound is located on the Right,Dorsal  Metatarsal head first. The wound measures 2.2cm length x 1cm width x 0.1cm depth; 1.728cm^2 area and 0.173cm^3 volume. There is Fat Layer (Subcutaneous Tissue) Exposed exposed. There is no tunneling or undermining noted. There is a large amount of serous drainage noted. The wound margin is flat and intact. There is no granulation within the wound bed. There is a large (67-100%) amount of necrotic tissue within the wound bed including Adherent Slough. The periwound skin appearance exhibited: Scarring, Maceration, Ecchymosis, Hemosiderin Staining, Mottled. Periwound temperature was noted as No Abnormality. The periwound has tenderness on palpation. Wound #8 status is Open. Original cause of wound was Gradually Appeared. The wound is located on the Right,Dorsal Toe Third. The wound measures 0.3cm length x 0.5cm width x 0.1cm depth; 0.118cm^2 area and 0.012cm^3 volume. The wound is limited to skin breakdown. There is no tunneling or undermining noted. There is a none present amount of drainage noted. The wound margin is flat and intact. There is no granulation within the wound bed. There is a large (67-100%) amount of necrotic tissue within the wound bed including Eschar. The periwound skin appearance exhibited: Dry/Scaly. Periwound temperature was noted as No Abnormality. The periwound has tenderness on palpation. Wound #9 status is Open. Original cause of wound was Gradually Appeared. The wound is located on the Right,Distal,Lateral Foot. The wound measures 2.5cm length x 0.5cm width x 0.2cm depth; 0.982cm^2  area and 0.196cm^3 volume. There is Fat Layer (Subcutaneous Tissue) Exposed exposed. There is no tunneling or undermining noted. There is a small amount of serous drainage noted. The wound margin is indistinct and nonvisible. There is no granulation within the wound bed. There is a large (67-100%) amount of necrotic tissue within the wound bed including Adherent Slough. The periwound skin  appearance exhibited: Hemosiderin Staining. The periwound skin appearance did not exhibit: Callus, Crepitus, Excoriation, Induration, Rash, Scarring, Dry/Scaly, Maceration, Atrophie Blanche, Cyanosis, Ecchymosis, Mottled, Pallor, Rubor, Erythema. Periwound temperature was noted as No Abnormality. The periwound has tenderness on palpation. 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 ([id number]) Plan Anesthetic: Wound #5 Right,Distal Lower Leg: Topical Lidocaine 4% cream applied to wound bed prior to debridement Wound #6 Right,Proximal Lower Leg: Topical Lidocaine 4% cream applied to wound bed prior to debridement Wound #7 Right,Dorsal Metatarsal head first: Topical Lidocaine 4% cream applied to wound bed prior to debridement Wound #8 Right,Dorsal Toe Third: Topical Lidocaine 4% cream applied to wound bed prior to debridement Wound #9 Right,Distal,Lateral Foot: Topical Lidocaine 4% cream applied to wound bed prior to debridement Primary Wound Dressing: Wound #5 Right,Distal Lower Leg: Santyl Ointment - If too expensive, use Medihoney or Manuka Honey. Wound #6 Right,Proximal Lower Leg: Santyl Ointment - If too expensive, use Medihoney or Manuka Honey. Wound #7 Right,Dorsal Metatarsal head first: Santyl Ointment - If too expensive, use Medihoney or Manuka Honey. Wound #8 Right,Dorsal Toe Third: Santyl Ointment - If too expensive, use Medihoney or  Manuka Honey. Wound #9 Right,Distal,Lateral Foot: Santyl Ointment - If too expensive, use Medihoney or Manuka Honey. Secondary Dressing: Wound #5 Right,Distal Lower Leg: Conform/Kerlix Non-adherent pad Wound #6 Right,Proximal Lower Leg: Conform/Kerlix Non-adherent pad Wound #7 Right,Dorsal Metatarsal head first: Conform/Kerlix Non-adherent pad Wound #8 Right,Dorsal Toe Third: Conform/Kerlix Non-adherent pad Wound #9 Right,Distal,Lateral Foot: Conform/Kerlix Non-adherent pad Dressing Change Frequency: Wound #5 Right,Distal Lower Leg: Change dressing every day. Wound #6 Right,Proximal Lower Leg: Change dressing every day. Wound #7 Right,Dorsal Metatarsal head first: Change dressing every day. Delamar, Kiesha V. ([id number]) Wound #8 Right,Dorsal Toe Third: Change dressing every day. Wound #9 Right,Distal,Lateral Foot: Change dressing every day. Follow-up Appointments: Wound #5 Right,Distal Lower Leg: Return Appointment in 1 week. Wound #6 Right,Proximal Lower Leg: Return Appointment in 1 week. Wound #7 Right,Dorsal Metatarsal head first: Return Appointment in 1 week. Wound #8 Right,Dorsal Toe Third: Return Appointment in 1 week. Wound #9 Right,Distal,Lateral Foot: Return Appointment in 1 week. Edema Control: Wound #5 Right,Distal Lower Leg: Elevate legs to the level of the heart and pump ankles as often as possible Wound #6 Right,Proximal Lower Leg: Elevate legs to the level of the heart and pump ankles as often as possible Wound #7 Right,Dorsal Metatarsal head first: Elevate legs to the level of the heart and pump ankles as often as possible Wound #8 Right,Dorsal Toe Third: Elevate legs to the level of the heart and pump ankles as often as possible Wound #9 Right,Distal,Lateral Foot: Elevate legs to the level  of the heart and pump ankles as often as possible Home Health: Wound #5 Right,Distal Lower Leg: Continue Home Health Visits - [facility] twice weekly, [facility]-Wednesday, teach family member to change on off day. Home Health Nurse may visit PRN to address patient s wound care needs. FACE TO FACE ENCOUNTER: MEDICARE and MEDICAID PATIENTS: I certify that this patient is under my care and that I had a face-to-face encounter that meets the physician face-to-face encounter requirements with this patient on this date. The encounter with the patient was in whole or in part for the following MEDICAL CONDITION: (primary reason for [place]) MEDICAL NECESSITY: I certify, that based on my findings, NURSING services are a medically necessary home health service. HOME BOUND STATUS: I certify that my clinical findings support that this patient is homebound (i.e., Due to illness or injury, pt requires aid of supportive devices such as crutches, cane, wheelchairs, walkers, the use of special transportation or the assistance of another person to leave their place of residence. There is a normal inability to leave the home and doing so requires considerable and taxing effort. Other absences are for medical reasons / religious services and are infrequent or of short duration when for other reasons). If current dressing causes regression in wound condition, may D/C ordered dressing product/s and apply Normal Saline Moist Dressing daily until next [place] / Other MD appointment. [place] of regression in wound condition at [phone]. Please direct any NON-WOUND related issues/requests for orders to patient's Primary Care Physician Wound #6 Right,Proximal Lower Leg: [place] Visits - [facility] twice weekly, [facility] Care Center-Wednesday, teach family member to change on off day. Home Health Nurse may visit PRN to address patient s wound care needs. FACE TO FACE ENCOUNTER: MEDICARE and MEDICAID PATIENTS: I certify that this patient is under my care and that I had a face-to-face encounter that meets the physician  face-to-face encounter Langenfeld, MARIAJOSE MOW. ([id number]) requirements with this patient on this date. The encounter with the patient was in whole or in part for the following MEDICAL CONDITION: (primary reason for [place]) MEDICAL NECESSITY: I certify, that based on my findings, NURSING services are a medically necessary home health service. HOME BOUND STATUS: I certify that my clinical findings support that this patient is homebound (i.e., Due to illness or injury, pt requires aid of supportive devices such as crutches, cane, wheelchairs, walkers, the use of special transportation or the assistance of another person to leave their place of residence. There is a normal inability to leave the home and doing so requires considerable and taxing effort. Other absences are for medical reasons / religious services and are infrequent or of short duration when for other reasons). If current dressing causes regression in wound condition, may D/C ordered dressing product/s and apply Normal Saline Moist Dressing daily until next [place] / Other MD appointment. [place] of regression in wound condition at [phone]. Please direct any NON-WOUND related issues/requests for orders to patient's Primary Care Physician Wound #7 Right,Dorsal Metatarsal head first: [place] Visits - [facility] twice weekly, Wound Care Center-Wednesday, teach family member to change on off day. Home Health Nurse may visit PRN to address patient s wound care needs. FACE TO FACE ENCOUNTER: MEDICARE and MEDICAID PATIENTS: I certify that this patient is under my care and that I had a face-to-face encounter that meets the physician face-to-face encounter requirements with this patient on this date. The  encounter with the patient was in whole or in part for the following MEDICAL CONDITION: (primary reason for [place]) MEDICAL NECESSITY: I certify, that based on my findings, NURSING  services are a medically necessary home health service. HOME BOUND STATUS: I certify that my clinical findings support that this patient is homebound (i.e., Due to illness or injury, pt requires aid of supportive devices such as crutches, cane, wheelchairs, walkers, the use of special transportation or the assistance of another person to leave their place of residence. There is a normal inability to leave the home and doing so requires considerable and taxing effort. Other absences are for medical reasons / religious services and are infrequent or of short duration when for other reasons). If current dressing causes regression in wound condition, may D/C ordered dressing product/s and apply Normal Saline Moist Dressing daily until next [place] / Other MD appointment. [name] of regression in wound condition at [phone]. Please direct any NON-WOUND related issues/requests for orders to patient's Primary Care Physician Wound #8 Right,Dorsal Toe Third: [place] Visits - [facility] twice weekly, [facility] Care Center-Wednesday, teach family member to change on off day. Home Health Nurse may visit PRN to address patient s wound care needs. FACE TO FACE ENCOUNTER: MEDICARE and MEDICAID PATIENTS: I certify that this patient is under my care and that I had a face-to-face encounter that meets the physician face-to-face encounter requirements with this patient on this date. The encounter with the patient was in whole or in part for the following MEDICAL CONDITION: (primary reason for [name]) MEDICAL NECESSITY: I certify, that based on my findings, NURSING services are a medically necessary home health service. HOME BOUND STATUS: I certify that my clinical findings support that this patient is homebound (i.e., Due to illness or injury, pt requires aid of supportive devices such as crutches, cane, wheelchairs, walkers, the use of special transportation or  the assistance of another person to leave their place of residence. There is a normal inability to leave the home and doing so requires considerable and taxing effort. Other absences are for medical reasons / religious services and are infrequent or of short duration when for other reasons). If current dressing causes regression in wound condition, may D/C ordered dressing product/s and apply Normal Saline Moist Dressing daily until next [place] / Other MD appointment. [place] of regression in wound condition at [phone]. Please direct any NON-WOUND related issues/requests for orders to patient's Primary Care Physician Wound #9 Right,Distal,Lateral Foot: [place] Visits - [facility] twice weekly, W[facility] Care Center-Wednesday, teach family member to change on off day. CIENNA, DUMAIS ([id number]) Home Health Nurse may visit PRN to address patient s wound care needs. FACE TO FACE ENCOUNTER: MEDICARE and MEDICAID PATIENTS: I certify that this patient is under my care and that I had a face-to-face encounter that meets the physician face-to-face encounter requirements with this patient on this date. The encounter with the patient was in whole or in part for the following MEDICAL CONDITION: (primary reason for [place]) MEDICAL NECESSITY: I certify, that based on my findings, NURSING services are a medically necessary home health service. HOME BOUND STATUS: I certify that my clinical findings support that this patient is homebound (i.e., Due to illness or injury, pt requires aid of supportive devices such as crutches, cane, wheelchairs, walkers, the use of special transportation or the assistance of another person to leave their place of residence. There  is a normal inability to leave the home and doing so requires considerable and taxing effort. Other absences are for medical reasons / religious services and are infrequent or of short duration when  for other reasons). If current dressing causes regression in wound condition, may D/C ordered dressing product/s and apply Normal Saline Moist Dressing daily until next [place] / Other MD appointment. [place] of regression in wound condition at [phone]. Please direct any NON-WOUND related issues/requests for orders to patient's Primary Care Physician Medications-please add to medication list.: Wound #5 Right,Distal Lower Leg: Santyl Enzymatic Ointment Wound #6 Right,Proximal Lower Leg: Santyl Enzymatic Ointment Wound #7 Right,Dorsal Metatarsal head first: Santyl Enzymatic Ointment Wound #8 Right,Dorsal Toe Third: Santyl Enzymatic Ointment Wound #9 Right,Distal,Lateral Foot: Santyl Enzymatic Ointment #1 we continued with Santyl to all wound areas as paradoxically there seems to be some improvement #2 the patient follows up Dr. [facility]t [facility] 23 and [place] #3 with regards to her lower extremity edema that I found so perplexing last week at our suggestion she has a primary doctor follow-up tomorrow. I am not certain why this is happening. Once again she does not appear to be in congestive heart failure. There is no suggestion of a DVT in her legs, the edema is equal. She is not weighing herself at home. She probably needs a spot urine for protein Electronic Signature(s) Signed: 11/20/2016 11:43:31 AM By: Linton Ham MD Previous Signature: 11/20/2016 11:13:28 AM Version By: Linton Ham MD Entered By: Linton Ham on 11/20/2016 11:43:31 [place], Bianca V. ([id number]) -------------------------------------------------------------------------------- SuperBill Details Patient Name: Lafon, Maysun V. Date of Service: 11/20/2016 Medical Record Patient Account Number: [id number] Number: Treating RN: Baruch Gouty, RN, BSN, Velva Harman [birth year] [phone] y.o. 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 diabetic peripheral angiopathy with gangrene I70.232 Atherosclerosis 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 of calf I70.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 of foot E43 Unspecified severe protein-calorie malnutrition Facility Procedures CPT4 Code: [id number] Description: 23762 - WOUND CARE VISIT-LEV 5 EST PT Modifier: Quantity: 1 Physician Procedures CPT4: Description Modifier Quantity Code [id number] 16073 - WC PHYS LEVEL 4 - EST PT 1 ICD-10 Description Diagnosis 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

## 2016-11-27 ENCOUNTER — Encounter: Payer: Medicare HMO | Admitting: Internal Medicine

## 2016-11-27 DIAGNOSIS — E11622 Type 2 diabetes mellitus with other skin ulcer: Secondary | ICD-10-CM | POA: Diagnosis not present

## 2016-11-28 NOTE — Progress Notes (Addendum)
[name] ([id number]) Visit Report for 11/27/2016 Chief Complaint Document Details Patient Name: [name], [name] Date of Service: 11/27/2016 8:45 AM Medical Record Patient Account Number: [id number] Number: Treating RN: Ahmed Prima 11/19/1940 (76 y.o. Other Clinician: Date of Birth/Sex: Female) Treating Trentan Trippe Primary Care Provider: Beverlyn Roux Provider/Extender: G Referring Provider: Dorthula Nettles in Treatment: 3 Information Obtained from: Patient Chief Complaint The patient is here for initial evaluation of RLE and Right foot/toe ulcers Electronic Signature(s) Signed: 11/27/2016 4:33:23 PM By: Linton Ham MD Entered By: Linton Ham on 11/27/2016 10:04:37 Barhorst, Trease [name] ([id number]) -------------------------------------------------------------------------------- Debridement Details Patient Name: White, [name] Date of Service: 11/27/2016 8:45 AM Medical Record Patient Account Number: [id number] Number: Treating RN: Ahmed Prima 04-29-1941 (76 y.o. Other Clinician: Date of Birth/Sex: Female) Treating Ramonita Koenig, [place] Primary Care Provider: Beverlyn Roux Provider/Extender: G Referring Provider: Dorthula Nettles in Treatment: 3 Debridement Performed for Wound #7 Right,Dorsal Metatarsal head first Assessment: Performed By: Physician Ricard Dillon, MD Debridement: Debridement Pre-procedure Yes - 09:32 Verification/Time Out Taken: Start Time: 09:33 Pain Control: Lidocaine 4% Topical Solution Level: Skin/Subcutaneous Tissue Total Area Debrided (L x 2.2 (cm) x 0.6 (cm) = 1.32 (cm) W): Tissue and other Viable, Non-Viable, Exudate, Fibrin/Slough, Subcutaneous material debrided: Instrument: Curette Bleeding: Minimum Hemostasis Achieved: Pressure End Time: 09:34 Procedural Pain: 0 Post Procedural Pain: 0 Response to Treatment: Procedure was tolerated well Post Debridement Measurements of Total Wound Length: (cm)  2.2 Width: (cm) 0.6 Depth: (cm) 0.2 Volume: (cm) 0.207 Character of Wound/Ulcer Post Requires Further Debridement Debridement: Severity of Tissue Post Debridement: Fat layer exposed Post Procedure Diagnosis Same as Pre-procedure Electronic Signature(s) Signed: 11/27/2016 4:33:23 PM By: Linton Ham MD Signed: 11/27/2016 4:45:04 PM By: Hiram Gash ([id number]) Entered By: Linton Ham on 11/27/2016 10:04:14 Birdwell, Loyd V. ([id number]) -------------------------------------------------------------------------------- Debridement Details Patient Name: White, [name] Date of Service: 11/27/2016 8:45 AM Medical Record Patient Account Number: [id number] Number: Treating RN: Ahmed Prima 05/08/1941 (76 y.o. Other Clinician: Date of Birth/Sex: Female) Treating Stellarose Cerny Primary Care Provider: Beverlyn Roux Provider/Extender: G Referring Provider: Dorthula Nettles in Treatment: 3 Debridement Performed for Wound #8 Right,Dorsal Toe Third Assessment: Performed By: Physician Ricard Dillon, MD Debridement: Debridement Pre-procedure Yes - 09:32 Verification/Time Out Taken: Start Time: 09:35 Pain Control: Lidocaine 4% Topical Solution Level: Skin/Subcutaneous Tissue Total Area Debrided (L x 0.5 (cm) x 0.5 (cm) = 0.25 (cm) W): Tissue and other Viable, Non-Viable, Exudate, Fibrin/Slough, Subcutaneous material debrided: Instrument: Curette Bleeding: Minimum Hemostasis Achieved: Pressure End Time: 09:36 Procedural Pain: 0 Post Procedural Pain: 0 Response to Treatment: Procedure was tolerated well Post Debridement Measurements of Total Wound Length: (cm) 0.5 Width: (cm) 0.5 Depth: (cm) 0.1 Volume: (cm) 0.02 Character of Wound/Ulcer Post Requires Further Debridement Debridement: Severity of Tissue Post Debridement: Fat layer exposed Post Procedure Diagnosis Same as Pre-procedure Electronic Signature(s) Signed: 11/27/2016  4:33:23 PM By: Linton Ham MD Signed: 11/27/2016 4:45:04 PM By: Hiram Gash ([id number]) Entered By: Linton Ham on 11/27/2016 10:04:24 Villegas, Passion [name] ([id number]) -------------------------------------------------------------------------------- HPI Details Patient Name: White, [name] Date of Service: 11/27/2016 8:45 AM Medical Record Patient Account Number: [id number] Number: Treating RN: Ahmed Prima 1941-07-29 (76 y.o. Other Clinician: Date of Birth/Sex: Female) Treating Zamira Hickam Primary Care Provider: Beverlyn Roux Provider/Extender: G Referring Provider: Dorthula Nettles in Treatment: 3 History of Present Illness HPI Description: 08/13/16: This is a 76 year old woman who came predominantly for review of 3 cm in diameter  circular wound to the left anterior lateral leg. She was in the ER on 08/01/16 I reviewed their notes. There was apparently pus coming out of the wound at that time and the patient arrived requesting debridement which they don't do in the emergency room. Nevertheless I can't see that they did any x-rays. There were no cultures done. She is a type II diabetic and I a note after the patient was in the clinic that she had a bypass graft from the popliteal to the tibial on the right on 02/28/16. She also had a right greater saphenous vein harvest on the same date for arterial bypass. She is going to have vascular studies including ABIs T ABIs on the right on 08/28/16. The patient's surgery was on 02/28/16 by Dr. Vallarie Mare she had a right below the knee popliteal artery to peroneal artery bypass with reverse greater saphenous vein and an endarterectomy of the mid segment peroneal artery. Postoperatively she had a strong mild monophasic peroneal signal with a pink foot. It would appear that the patient is had some nonhealing in the surgical saphenous vein harvest site on the left leg. Surprisingly [facility] I  cannot see much information about this at all. Dr. Lucious Groves notes from 05/29/16 show that the patient's wounds "are not healed" the right first metatarsal wound healed but then opened back up. 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 At that point the patient had a wound VAC to her right calf with regards to the wounds on her dorsal right toe would appear that these are felt to be arterial wounds. She has had surgery on the metatarsal phalangeal in 2015 I Dr. Doran Durand secondary to a right metatarsal phalangeal joint fracture. She is apparently had discoloration around this area since then. 08/28/16; patient arrives with her wounds in much the same condition. The linear vein harvest site and the circular wound below it which I think was a blister. She also has to probing holes in her right great toe and a necrotic eschar on the right second toe. Because of these being arterial wounds I reduced her compression from 3-2 layers this seems to of done satisfactorily she has not had any problems. I cannot see that she is actually had an x-ray ====== 11/06/16 the patient comes in for evaluation of her right lower extremity ulcers. She was here in January 2018 for 2 visits subsequently ended up in the hospital with pneumonia and then to rehabilitation. She has now been discharged from rehabilitation and is home. She has multiple ulcerations to her right lower extremity including the foot and toes. She does have home health in place and they have been placing alginate to the ulcers. She is followed by Dr. Bridgett Larsson of vascular medicine. 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 She recently saw him on 3/23. In office ABIs were: Mcclintock, DEBBORA ANG. ([id number]) Right 0.48 with monophasic flow to the DP, PT, peroneal Left 0.63 with monophasic flow  to the DP and PT Her arterial studies indicated a patent right below knee popliteal to peroneal bypass She had an MRI in February 2008 that was negative frosty myelitis but this showed general soft tissue edema in the right foot and lower extremity concerning for cellulitis She is a diabetic, managed with insulin. Her hemoglobin A1c in December 2017 was 8.4  which is a trend up from previous levels. She had blood work in February 2018 which revealed an albumin of 2.6 this appears to be relatively acute as an albumin in November 2017 was 3.7 11/13/16; this is a patient I have not seen since February who is readmitted to our clinic last week. 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 Bridgett Larsson. I have reviewed Dr. Lianne Moris notes from March/23/18. Doppler ABI on that date showed an ABI on the right of 0.48 and on the left of 0.63. Dorsalis pedis waveforms were monophasic bilaterally. There was no waveforms detected at the posterior tibial on the right, monophasic on the left. Dr. Lianne Moris comments were that this patient would have follow-up vascular studies in 3 months including ABIs and right lower extremity arterial duplex. She had an MRI in February that was negative for osteomyelitis but showed generalized edema in the foot. Last albumin I see was in January at 3.4 we have been using Santyl to the 4 wounds on the right leg the patient is noted today to have widespread edema well up towards her groin this is pitting 2-3+. 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 Normal ejection fraction. 11/20/16; patient has a follow-up appointment with Dr. Bridgett Larsson on April 23. She is still complaining of a lot of pain in the right foot and right leg. It is not clear to me that this is at all positional however I think it is clear claudication with minimal activity perhaps at rest. At our  suggestion she is return to her primary physician's office tomorrow with regards to her pitting lower extremity bilateral edema that I reviewed in detail last week 11/27/16; the follow-up with Dr. Bridgett Larsson was actually on May 23 on April 23 as I stated in my note last week. N/A case all of her wounds seems somewhat smaller. The 2 on the right leg are definitely smaller. The areas on the dorsal right first toe, right third toe and the lateral part of the right fifth metatarsal head all looks smaller but have tightly adherent surfaces. We have been using Environmental health practitioner) Signed: 11/27/2016 4:33:23 PM By: Linton Ham MD Entered By: Linton Ham on 11/27/2016 10:05:48 Waldrep, Alysia [name] ([id number]) -------------------------------------------------------------------------------- Physical Exam Details Patient Name: Karen, Kyrin V. Date of Service: 11/27/2016 8:45 AM Medical Record Patient Account Number: [id number] Number: Treating RN: Ahmed Prima [birth year] (76 y.o. Other Clinician: Date of Birth/Sex: Female) Treating Nalany Steedley Primary Care Provider: Beverlyn Roux Provider/Extender: G Referring Provider: Dorthula Nettles in Treatment: 3 Constitutional Patient is hypertensive.. Pulse regular and within target range for patient.[name] Respirations regular, non-labored and within target range.. Temperature is normal and within the target range for the patient.. Patient's appearance is neat and clean. Appears in no acute distress. Well nourished and well developed.. Notes Wound exam; 2 small wounds on the right leg one in the vein harvest site and one below it both of these are considerably smaller and we will use Prisma on these. oShe has 3 wounds on the right foot including the base of the right first toe the dorsal first metatarsophalangeal, dorsal third toe and the lateral aspect of the right fifth metatarsal head. All of these were debrided with a #3 curet  removing necrotic surface material Electronic Signature(s) Signed: 11/27/2016 4:33:23 PM By: Linton Ham MD Entered By: Linton Ham on 11/27/2016 10:06:58 Arrey, [place]  [name] ([id number]) -------------------------------------------------------------------------------- Physician Orders Details Patient Name: Jolicoeur, Aeisha V. Date of Service: 11/27/2016 8:45 AM Medical Record Patient Account Number: [id number] Number: Treating RN: Ahmed Prima 11-Oct-1940 (76 y.o. Other Clinician: Date of Birth/Sex: Female) Treating Fahim Kats Primary Care Provider: Beverlyn Roux Provider/Extender: G Referring Provider: Dorthula Nettles in Treatment: 3 Verbal / Phone Orders: Yes ClinicianCarolyne Fiscal, Debi Read Back and Verified: Yes Diagnosis Coding Wound Cleansing Wound #5 Right,Distal Lower Leg o Clean wound with Normal Saline. o Cleanse wound with mild soap and water Wound #6 Right,Proximal Lower Leg o Clean wound with Normal Saline. o Cleanse wound with mild soap and water Wound #7 Right,Dorsal Metatarsal head first o Clean wound with Normal Saline. o Cleanse wound with mild soap and water Wound #8 Right,Dorsal Toe Third o Clean wound with Normal Saline. o Cleanse wound with mild soap and water Wound #9 Right,Distal,Lateral Foot o Clean wound with Normal Saline. o Cleanse wound with mild soap and water Anesthetic Wound #5 Right,Distal Lower Leg o Topical Lidocaine 4% cream applied to wound bed prior to debridement Wound #6 Right,Proximal Lower Leg o Topical Lidocaine 4% cream applied to wound bed prior to debridement Wound #7 Right,Dorsal Metatarsal head first o Topical Lidocaine 4% cream applied to wound bed prior to debridement Wound #8 Right,Dorsal Toe Third o Topical Lidocaine 4% cream applied to wound bed prior to debridement Wound #9 Right,Distal,Lateral Foot o Topical Lidocaine 4% cream applied to wound bed prior to  debridement Stembridge, [name] ([id number]) Primary Wound Dressing Wound #5 Right,Distal Lower Leg o Foam - Restore Wound #6 Right,Proximal Lower Leg o Foam - Restore Wound #7 Right,Dorsal Metatarsal head first o Santyl Ointment - If too expensive, use Medihoney or Manuka Honey. Wound #8 Right,Dorsal Toe Third o Santyl Ointment - If too expensive, use Medihoney or Manuka Honey. Wound #9 Right,Distal,Lateral Foot o Santyl Ointment - If too expensive, use Medihoney or Manuka Honey. Secondary Dressing Wound #5 Right,Distal Lower Leg o Conform/Kerlix Wound #6 Right,Proximal Lower Leg o Conform/Kerlix Wound #7 Right,Dorsal Metatarsal head first o Dry Gauze o Conform/Kerlix Wound #8 Right,Dorsal Toe Third o Dry Gauze o Conform/Kerlix Wound #9 Right,Distal,Lateral Foot o Dry Gauze o Conform/Kerlix Dressing Change Frequency Wound #5 Right,Distal Lower Leg o Three times weekly Wound #6 Right,Proximal Lower Leg o Three times weekly Wound #7 Right,Dorsal Metatarsal head first o Change dressing every day. Wound #8 Right,Dorsal Toe Third o Change dressing every day. Geibel, Coletta V. ([id number]) Wound #9 Right,Distal,Lateral Foot o Change dressing every day. Follow-up Appointments Wound #5 Right,Distal Lower Leg o Return Appointment in 1 week. Wound #6 Right,Proximal Lower Leg o Return Appointment in 1 week. Wound #7 Right,Dorsal Metatarsal head first o Return Appointment in 1 week. Wound #8 Right,Dorsal Toe Third o Return Appointment in 1 week. Wound #9 Right,Distal,Lateral Foot o Return Appointment in 1 week. Edema Control Wound #5 Right,Distal Lower Leg o Elevate legs to the level of the heart and pump ankles as often as possible Wound #6 Right,Proximal Lower Leg o Elevate legs to the level of the heart and pump ankles as often as possible Wound #7 Right,Dorsal Metatarsal head first o Elevate legs to the level of the heart  and pump ankles as often as possible Wound #8 Right,Dorsal Toe Third o Elevate legs to the level of the heart and pump ankles as often as possible Wound #9 Right,Distal,Lateral Foot o Elevate legs to the level of the heart and pump ankles as often as possible Home Health Wound #  [place] [facility] - [facility] twice weekly, [facility]-Wednesday, teach family member to change on off day. o Home Health Nurse may visit PRN to address patientos wound care needs. o FACE TO FACE ENCOUNTER: MEDICARE and MEDICAID PATIENTS: I certify that this patient is under my care and that I had a face-to-face encounter that meets the physician face-to-face encounter requirements with this patient on this date. The encounter with the patient was in whole or in part for the following MEDICAL CONDITION: (primary reason for [place]) MEDICAL NECESSITY: I certify, that based on my findings, NURSING services are a medically necessary home health service. HOME BOUND STATUS: I certify that my clinical findings support that this patient is homebound (i.e., Due to illness or injury, pt requires aid of Xu, Floria V. ([id number]) supportive devices such as crutches, cane, wheelchairs, walkers, the use of special transportation or the assistance of another person to leave their place of residence. There is a normal inability to leave the home and doing so requires considerable and taxing effort. Other absences are for medical reasons / religious services and are infrequent or of short duration when for other reasons). o If current dressing causes regression in wound condition, may D/C ordered dressing product/s and apply Normal Saline Moist Dressing daily until next [place] / Other MD appointment. [place] of regression in wound condition at [phone]. o Please direct any NON-WOUND related issues/requests for orders to  patient's Primary Care Physician Wound #6 Right,Proximal Lower Leg o [place] Visits - [facility] twice weekly, [facility] Care [facility]-Wednesday, teach family member to change on off day. o Home Health Nurse may visit PRN to address patientos wound care needs. o FACE TO FACE ENCOUNTER: MEDICARE and MEDICAID PATIENTS: I certify that this patient is under my care and that I had a face-to-face encounter that meets the physician face-to-face encounter requirements with this patient on this date. The encounter with the patient was in whole or in part for the following MEDICAL CONDITION: (primary reason for [place]) MEDICAL NECESSITY: I certify, that based on my findings, NURSING services are a medically necessary home health service. HOME BOUND STATUS: I certify that my clinical findings support that this patient is homebound (i.e., Due to illness or injury, pt requires aid of supportive devices such as crutches, cane, wheelchairs, walkers, the use of special transportation or the assistance of another person to leave their place of residence. There is a normal inability to leave the home and doing so requires considerable and taxing effort. Other absences are for medical reasons / religious services and are infrequent or of short duration when for other reasons). o If current dressing causes regression in wound condition, may D/C ordered dressing product/s and apply Normal Saline Moist Dressing daily until next [place] / Other MD appointment. [place] of regression in wound condition at [phone]. o Please direct any NON-WOUND related issues/requests for orders to patient's Primary Care Physician Wound #7 Right,Dorsal Metatarsal head first o [place] Visits - [facility] twice weekly, W[facility] Care Center-Wednesday, teach family member to change on off day. o Home Health Nurse may visit PRN to address patientos wound care  needs. o FACE TO FACE ENCOUNTER: MEDICARE and MEDICAID PATIENTS: I certify that this patient is under my care and that I had a face-to-face encounter that meets the physician face-to-face encounter requirements with this patient on this date. The encounter with the  patient was in whole or in part for the following MEDICAL CONDITION: (primary reason for [place]) MEDICAL NECESSITY: I certify, that based on my findings, NURSING services are a medically necessary home health service. HOME BOUND STATUS: I certify that my clinical findings support that this patient is homebound (i.e., Due to illness or injury, pt requires aid of supportive devices such as crutches, cane, wheelchairs, walkers, the use of special transportation or the assistance of another person to leave their place of residence. There is a normal inability to leave the home and doing so requires considerable and taxing effort. Other absences are for medical reasons / religious services and are infrequent or of short duration when for other reasons). Grabel, Deshea V. ([id number]) o If current dressing causes regression in wound condition, may D/C ordered dressing product/s and apply Normal Saline Moist Dressing daily until next [place] / Other MD appointment. [place] of regression in wound condition at [phone]. o Please direct any NON-WOUND related issues/requests for orders to patient's Primary Care Physician Wound #8 Right,Dorsal Toe [place] Visits - [facility] twice weekly, [facility] Care [facility]-Wednesday, teach family member to change on off day. o Home Health Nurse may visit PRN to address patientos wound care needs. o FACE TO FACE ENCOUNTER: MEDICARE and MEDICAID PATIENTS: I certify that this patient is under my care and that I had a face-to-face encounter that meets the physician face-to-face encounter requirements with this patient on this date. The  encounter with the patient was in whole or in part for the following MEDICAL CONDITION: (primary reason for [place]) MEDICAL NECESSITY: I certify, that based on my findings, NURSING services are a medically necessary home health service. HOME BOUND STATUS: I certify that my clinical findings support that this patient is homebound (i.e., Due to illness or injury, pt requires aid of supportive devices such as crutches, cane, wheelchairs, walkers, the use of special transportation or the assistance of another person to leave their place of residence. There is a normal inability to leave the home and doing so requires considerable and taxing effort. Other absences are for medical reasons / religious services and are infrequent or of short duration when for other reasons). o If current dressing causes regression in wound condition, may D/C ordered dressing product/s and apply Normal Saline Moist Dressing daily until next [place] / Other MD appointment. [place] of regression in wound condition at [phone]. o Please direct any NON-WOUND related issues/requests for orders to patient's Primary Care Physician Wound #9 [place] Visits - [facility] twice weekly, [facility]-Wednesday, teach family member to change on off day. o Home Health Nurse may visit PRN to address patientos wound care needs. o FACE TO FACE ENCOUNTER: MEDICARE and MEDICAID PATIENTS: I certify that this patient is under my care and that I had a face-to-face encounter that meets the physician face-to-face encounter requirements with this patient on this date. The encounter with the patient was in whole or in part for the following MEDICAL CONDITION: (primary reason for [place]) MEDICAL NECESSITY: I certify, that based on my findings, NURSING services are a medically necessary home health service. HOME BOUND STATUS: I certify  that my clinical findings support that this patient is homebound (i.e., Due to illness or injury, pt requires aid of supportive devices such as crutches, cane, wheelchairs, walkers, the use of special transportation or the assistance of another person to leave  their place of residence. There is a normal inability to leave the home and doing so requires considerable and taxing effort. Other absences are for medical reasons / religious services and are infrequent or of short duration when for other reasons). o If current dressing causes regression in wound condition, may D/C ordered dressing product/s and apply Normal Saline Moist Dressing daily until next [place] / Other MD appointment. [place] of regression in wound condition at [phone]. o Please direct any NON-WOUND related issues/requests for orders to patient's Primary Care Physician Westrich, ALEENA KIRKEBY ([id number]) Medications-please add to medication list. Wound #7 Right,Dorsal Metatarsal head first o Santyl Enzymatic Ointment Wound #8 Right,Dorsal Toe Third o Santyl Enzymatic Ointment Wound #9 Right,Distal,Lateral Foot o Santyl Enzymatic Ointment Electronic Signature(s) Signed: 11/27/2016 4:33:23 PM By: Linton Ham MD Signed: 11/27/2016 4:45:04 PM By: Alric Quan Entered By: Alric Quan on 11/27/2016 11:32:03 Redel, Pooja V. ([id number]) -------------------------------------------------------------------------------- Problem List Details Patient Name: Murata, Raileigh V. Date of Service: 11/27/2016 8:45 AM Medical Record Patient Account Number: [id number] Number: Treating RN: Ahmed Prima 07/15/1941 (76 y.o. 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 11/06/2016 Yes severity E11.52 Type 2 diabetes mellitus 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 Date of Service: 11/27/2016 8:45 AM Medical Record Patient Account Number: [id number] Number: Treating RN: Ahmed Prima [birth year] (76 y.o. Other Clinician: Date of Birth/Sex: Female) Treating Luzmaria Devaux Primary Care Provider: Beverlyn Roux Provider/Extender: G Referring Provider: Dorthula Nettles in Treatment: 3 Subjective Chief Complaint Information obtained from Patient The patient is here for initial evaluation of RLE and Right foot/toe ulcers History of Present Illness (HPI) 08/13/16: This is a 76 year old woman who came predominantly for review of 3 cm in diameter circular wound to the left anterior lateral leg. She was in the ER on 08/01/16 I reviewed their notes. There was apparently pus coming out of the wound at that time and the patient arrived requesting debridement which they don't do in the emergency room. Nevertheless I can't see that they did any x-rays. There were no cultures  done. She is a type II diabetic and I a note after the patient was in the clinic that she had a bypass graft from the popliteal to the tibial on the right on 02/28/16. She also had a right greater saphenous vein harvest on the same date for arterial bypass. She is going to have vascular studies including ABIs T ABIs on the right on 08/28/16. The patient's surgery was on 02/28/16 by Dr. Vallarie Mare she had a right below the knee popliteal artery to peroneal artery bypass with reverse greater saphenous vein and an endarterectomy of the mid segment  peroneal artery. Postoperatively she had a strong mild monophasic peroneal signal with a pink foot. It would appear that the patient is had some nonhealing in the surgical saphenous vein harvest site on the left leg. Surprisingly looking through [facility] I cannot see much information about this at all. Dr. Lucious Groves notes from 05/29/16 show that the patient's wounds "are not healed" the right first metatarsal wound healed but then opened back up. 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 At that point the patient had a wound VAC to her right calf with regards to the wounds on her dorsal right toe would appear that these are felt to be arterial wounds. She has had surgery on the metatarsal phalangeal in 2015 I Dr. Doran Durand secondary to a right metatarsal phalangeal joint fracture. She is apparently had discoloration around this area since then. 08/28/16; patient arrives with her wounds in much the same condition. The linear vein harvest site and the circular wound below it which I think was a blister. She also has to probing holes in her right great toe and a necrotic eschar on the right second toe. Because of these being arterial wounds I reduced her compression from 3-2 layers this seems to of done satisfactorily she has not had any problems. I cannot see that  she is actually had an x-ray ====== 11/06/16 the patient comes in for evaluation of her right lower extremity ulcers. She was here in January 2018 for 2 visits subsequently ended up in the hospital with pneumonia and then to rehabilitation. She has now been Hamil, [place] ([id number]) discharged from rehabilitation and is home. She has multiple ulcerations to her right lower extremity including the foot and toes. She does have home health in place and they have been placing alginate to the ulcers. She is followed by Dr. Bridgett Larsson of vascular medicine. 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 She recently saw him on 3/23. In office ABIs were: Right 0.48 with monophasic flow to the DP, PT, peroneal Left 0.63 with monophasic flow to the DP and PT Her arterial studies indicated a patent right below knee popliteal to peroneal bypass She had an MRI in February 2008 that was negative frosty myelitis but this showed general soft tissue edema in the right foot and lower extremity concerning for cellulitis She is a diabetic, managed with insulin. Her hemoglobin A1c in December 2017 was 8.4 which is a trend up from previous levels. She had blood work in February 2018 which revealed an albumin of 2.6 this appears to be relatively acute as an albumin in November 2017 was 3.7 11/13/16; this is a patient I have not seen since February who is readmitted to our clinic last week. 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 Bridgett Larsson. I have reviewed Dr. Lianne Moris notes from March/23/18. Doppler ABI on that date showed an ABI on the right of 0.48 and on the left of 0.63. Dorsalis pedis waveforms were monophasic bilaterally. There was no waveforms detected at the posterior tibial on the right, monophasic on the left. Dr. Lianne Moris comments were that this patient would have follow-up vascular  studies in 3 months including ABIs and right lower extremity arterial duplex. She had an MRI in February that was negative for osteomyelitis  but showed generalized edema in the foot. Last albumin I see was in January at 3.4 we have been using Santyl to the 4 wounds on the right leg the patient is noted today to have widespread edema well up towards her groin this is pitting 2-3+. 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 Normal ejection fraction. 11/20/16; patient has a follow-up appointment with Dr. Bridgett Larsson on April 23. She is still complaining of a lot of pain in the right foot and right leg. It is not clear to me that this is at all positional however I think it is clear claudication with minimal activity perhaps at rest. At our suggestion she is return to her primary physician's office tomorrow with regards to her pitting lower extremity bilateral edema that I reviewed in detail last week 11/27/16; the follow-up with Dr. Bridgett Larsson was actually on May 23 on April 23 as I stated in my note last week. N/A case all of her wounds seems somewhat smaller. The 2 on the right leg are definitely smaller. The areas on the dorsal right first toe, right third toe and the lateral part of the right fifth metatarsal head all looks smaller but have tightly adherent surfaces. We have been using Santyl Objective Constitutional Patient is hypertensive.. Pulse regular and within target range for patient.[name] Respirations regular, non-labored and within target range.. Temperature is normal and within the target range for the patient.. Patient's appearance is neat and clean. Appears in no acute distress. Well nourished and well developed.. Vitals Time Taken: 8:58 AM, Height: 64 in, Weight: 200 lbs, BMI: 34.3, Temperature: 97.5 F, Pulse: 80 bpm, Respiratory Rate: 18 breaths/min, Blood Pressure: 156/58 mmHg, Pulse Oximetry: 97 %. Rundquist, TEKEYA GEFFERT ([id number]) General  Notes: Wound exam; 2 small wounds on the right leg one in the vein harvest site and one below it both of these are considerably smaller and we will use Prisma on these. She has 3 wounds on the right foot including the base of the right first toe the dorsal first metatarsophalangeal, dorsal third toe and the lateral aspect of the right fifth metatarsal head. All of these were debrided with a #3 curet removing necrotic surface material Integumentary (Hair, Skin) Wound #5 status is Open. Original cause of wound was Gradually Appeared. The wound is located on the Right,Distal Lower Leg. The wound measures 0.2cm length x 0.2cm width x 0.1cm depth; 0.031cm^2 area and 0.003cm^3 volume. There is Fat Layer (Subcutaneous Tissue) Exposed exposed. There is no tunneling or undermining noted. There is a small amount of serous drainage noted. The wound margin is flat and intact. There is large (67-100%) pink, friable granulation within the wound bed. There is no necrotic tissue within the wound bed. The periwound skin appearance exhibited: Scarring, Maceration, Hemosiderin Staining. The periwound skin appearance did not exhibit: Callus, Crepitus, Excoriation, Induration, Rash, Dry/Scaly, Atrophie Blanche, Cyanosis, Ecchymosis, Mottled, Pallor, Rubor, Erythema. Periwound temperature was noted as No Abnormality. The periwound has tenderness on palpation. Wound #6 status is Open. Original cause of wound was Gradually Appeared. The wound is located on the Right,Proximal Lower Leg. The wound measures 0.8cm length x 0.4cm width x 0.1cm depth; 0.251cm^2 area and 0.025cm^3 volume. There is Fat Layer (Subcutaneous Tissue) Exposed exposed. There is no tunneling or undermining noted. There is a large amount of serous drainage noted. The wound margin is flat and intact. There is no granulation within the wound bed. There is a large (67-100%) amount of  necrotic tissue within the wound bed including Adherent Slough. The  periwound skin appearance exhibited: Scarring, Hemosiderin Staining. The periwound skin appearance did not exhibit: Callus, Crepitus, Excoriation, Induration, Rash, Dry/Scaly, Maceration, Atrophie Blanche, Cyanosis, Ecchymosis, Mottled, Pallor, Rubor, Erythema. Periwound temperature was noted as No Abnormality. The periwound has tenderness on palpation. Wound #7 status is Open. Original cause of wound was Gradually Appeared. The wound is located on the Right,Dorsal Metatarsal head first. The wound measures 2.2cm length x 0.6cm width x 0.1cm depth; 1.037cm^2 area and 0.104cm^3 volume. There is Fat Layer (Subcutaneous Tissue) Exposed exposed. There is no tunneling or undermining noted. There is a large amount of serous drainage noted. The wound margin is flat and intact. There is no granulation within the wound bed. There is a large (67-100%) amount of necrotic tissue within the wound bed including Adherent Slough. The periwound skin appearance exhibited: Scarring, Maceration, Ecchymosis, Hemosiderin Staining, Mottled. Periwound temperature was noted as No Abnormality. The periwound has tenderness on palpation. Wound #8 status is Open. Original cause of wound was Gradually Appeared. The wound is located on the Right,Dorsal Toe Third. The wound measures 0.5cm length x 0.5cm width x 0.1cm depth; 0.196cm^2 area and 0.02cm^3 volume. The wound is limited to skin breakdown. There is no tunneling or undermining noted. There is a large amount of serous drainage noted. The wound margin is flat and intact. There is no granulation within the wound bed. There is a large (67-100%) amount of necrotic tissue within the wound bed including Eschar and Adherent Slough. The periwound skin appearance exhibited: Maceration. The periwound skin appearance did not exhibit: Dry/Scaly. Periwound temperature was noted as No Abnormality. The periwound has tenderness on palpation. Wound #9 status is Open. Original cause of  wound was Gradually Appeared. The wound is located on the Right,Distal,Lateral Foot. The wound measures 0.2cm length x 0.3cm width x 0.1cm depth; 0.047cm^2 area Bartus, Danyele V. ([id number]) and 0.005cm^3 volume. There is Fat Layer (Subcutaneous Tissue) Exposed exposed. There is no tunneling or undermining noted. There is a large amount of serous drainage noted. The wound margin is indistinct and nonvisible. There is no granulation within the wound bed. There is a large (67-100%) amount of necrotic tissue within the wound bed including Adherent Slough. The periwound skin appearance exhibited: Maceration, Hemosiderin Staining. The periwound skin appearance did not exhibit: Callus, Crepitus, Excoriation, Induration, Rash, Scarring, Dry/Scaly, Atrophie Blanche, Cyanosis, Ecchymosis, Mottled, Pallor, Rubor, Erythema. Periwound temperature was noted as No Abnormality. The periwound has tenderness on palpation. 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 There was a Skin/Subcutaneous Tissue Debridement ([id number]) debridement with total area of 1.32 sq cm performed by Ricard Dillon, MD. with the following instrument(s): Curette to remove Viable and Non-Viable tissue/material including Exudate, Fibrin/Slough, and Subcutaneous after achieving pain  control using Lidocaine 4% Topical Solution. A time out was conducted at 09:32, prior to the start of the procedure.  A Minimum amount of bleeding was controlled with Pressure. The procedure was tolerated well with a pain level of 0 throughout and a pain level of 0 following the procedure. Post Debridement Measurements: 2.2cm length x 0.6cm width x 0.2cm depth; 0.207cm^3 volume. Character of Wound/Ulcer Post Debridement requires further debridement. Severity of Tissue Post Debridement is: Fat layer exposed. Post procedure Diagnosis Wound #7: Same as Pre-Procedure Wound #8 Wound #8 is a Diabetic Wound/Ulcer of the Lower Extremity located on the Right,Dorsal Toe Third . There was a Skin/Subcutaneous Tissue Debridement ([id number]) debridement with total area of 0.25 sq cm performed by Ricard Dillon, MD. with the following instrument(s): Curette to remove Viable and Meschke, Mishael V. ([id number]) Non-Viable tissue/material including Exudate, Fibrin/Slough, and Subcutaneous after achieving pain control using Lidocaine 4% Topical Solution. A time out was conducted at 09:32, prior to the start of the procedure. A Minimum amount of bleeding was controlled with Pressure. The procedure was tolerated well with a pain level of 0 throughout and a pain level of 0 following the procedure. Post Debridement Measurements: 0.5cm length x 0.5cm width x 0.1cm depth; 0.02cm^3 volume. Character of Wound/Ulcer Post Debridement requires further debridement. Severity of Tissue Post Debridement is: Fat layer exposed. Post procedure Diagnosis Wound #8: Same as Pre-Procedure Plan Wound Cleansing: Wound #5 Right,Distal Lower Leg: Clean wound with Normal Saline. Cleanse wound with mild soap and water Wound #6 Right,Proximal Lower Leg: Clean wound with Normal Saline. Cleanse wound with mild soap and water Wound #7 Right,Dorsal Metatarsal head first: Clean wound with Normal Saline. Cleanse wound with mild soap and  water Wound #8 Right,Dorsal Toe Third: Clean wound with Normal Saline. Cleanse wound with mild soap and water Wound #9 Right,Distal,Lateral Foot: Clean wound with Normal Saline. Cleanse wound with mild soap and water Anesthetic: Wound #5 Right,Distal Lower Leg: Topical Lidocaine 4% cream applied to wound bed prior to debridement Wound #6 Right,Proximal Lower Leg: Topical Lidocaine 4% cream applied to wound bed prior to debridement Wound #7 Right,Dorsal Metatarsal head first: Topical Lidocaine 4% cream applied to wound bed prior to debridement Wound #8 Right,Dorsal Toe Third: Topical Lidocaine 4% cream applied to wound bed prior to debridement Wound #9 Right,Distal,Lateral Foot: Topical Lidocaine 4% cream applied to wound bed prior to debridement Primary Wound Dressing: Wound #5 Right,Distal Lower Leg: Foam - Restore Wound #6 Right,Proximal Lower Leg: Foam - Restore Wound #7 Right,Dorsal Metatarsal head first: Santyl Ointment - If too expensive, use Medihoney or Manuka Honey. Mateus, Aybree V. ([id number]) Wound #8 Right,Dorsal Toe Third: Santyl Ointment - If too expensive, use Medihoney or Manuka Honey. Wound #9 Right,Distal,Lateral Foot: Santyl Ointment - If too expensive, use Medihoney or Manuka Honey. Secondary Dressing: Wound #5 Right,Distal Lower Leg: Conform/Kerlix Wound #6 Right,Proximal Lower Leg: Conform/Kerlix Wound #7 Right,Dorsal Metatarsal head first: Dry Gauze Conform/Kerlix Wound #8 Right,Dorsal Toe Third: Dry Gauze Conform/Kerlix Wound #9 Right,Distal,Lateral Foot: Dry Gauze Conform/Kerlix Dressing Change Frequency: Wound #5 Right,Distal Lower Leg: Three times weekly Wound #6 Right,Proximal Lower Leg: Three times weekly Wound #7 Right,Dorsal Metatarsal head first: Change dressing every day. Wound #8 Right,Dorsal Toe Third: Change dressing every day. Wound #9 Right,Distal,Lateral Foot: Change dressing every day. Follow-up Appointments: Wound #5  Right,Distal Lower Leg: Return Appointment in 1 week. Wound #6 Right,Proximal Lower Leg: Return Appointment in 1 week. Wound #7 Right,Dorsal Metatarsal head first: Return Appointment in 1 week. Wound #8 Right,Dorsal Toe Third: Return Appointment in 1 week. Wound #9 Right,Distal,Lateral Foot: Return Appointment in 1 week. Edema Control: Wound #  5 Right,Distal Lower Leg: Elevate legs to the level of the heart and pump ankles as often as possible Wound #6 Right,Proximal Lower Leg: Elevate legs to the level of the heart and pump ankles as often as possible Wound #7 Right,Dorsal Metatarsal head first: Elevate legs to the level of the heart and pump ankles as often as possible Wound #8 Right,Dorsal Toe Third: Elevate legs to the level of the heart and pump ankles as often as possible Wound #9 Right,Distal,Lateral Foot: Elevate legs to the level of the heart and pump ankles as often as possible Salazar, Luisa V. ([id number]) Home Health: Wound #5 Right,Distal Lower Leg: Continue Home Health Visits - [facility] weekly, [facility], teach family member to change on off day. Home Health Nurse may visit PRN to address patient s wound care needs. FACE TO FACE ENCOUNTER: MEDICARE and MEDICAID PATIENTS: I certify that this patient is under my care and that I had a face-to-face encounter that meets the physician face-to-face encounter requirements with this patient on this date. The encounter with the patient was in whole or in part for the following MEDICAL CONDITION: (primary reason for [place]) MEDICAL NECESSITY: I certify, that based on my findings, NURSING services are a medically necessary home health service. HOME BOUND STATUS: I certify that my clinical findings support that this patient is homebound (i.e., Due to illness or injury, pt requires aid of supportive devices such as crutches, cane, wheelchairs, walkers, the use of special transportation or the  assistance of another person to leave their place of residence. There is a normal inability to leave the home and doing so requires considerable and taxing effort. Other absences are for medical reasons / religious services and are infrequent or of short duration when for other reasons). If current dressing causes regression in wound condition, may D/C ordered dressing product/s and apply Normal Saline Moist Dressing daily until next [place] / Other MD appointment. [place] of regression in wound condition at [phone]. Please direct any NON-WOUND related issues/requests for orders to patient's Primary Care Physician Wound #6 Right,Proximal Lower Leg: [place] Visits - [facility] twice weekly, [facility]-Wednesday, teach family member to change on off day. Home Health Nurse may visit PRN to address patient s wound care needs. FACE TO FACE ENCOUNTER: MEDICARE and MEDICAID PATIENTS: I certify that this patient is under my care and that I had a face-to-face encounter that meets the physician face-to-face encounter requirements with this patient on this date. The encounter with the patient was in whole or in part for the following MEDICAL CONDITION: (primary reason for [place]) MEDICAL NECESSITY: I certify, that based on my findings, NURSING services are a medically necessary home health service. HOME BOUND STATUS: I certify that my clinical findings support that this patient is homebound (i.e., Due to illness or injury, pt requires aid of supportive devices such as crutches, cane, wheelchairs, walkers, the use of special transportation or the assistance of another person to leave their place of residence. There is a normal inability to leave the home and doing so requires considerable and taxing effort. Other absences are for medical reasons / religious services and are infrequent or of short duration when for other reasons). If current  dressing causes regression in wound condition, may D/C ordered dressing product/s and apply Normal Saline Moist Dressing daily until next [place] / Other MD appointment. [place] of regression in wound condition at [phone].  Please direct any NON-WOUND related issues/requests for orders to patient's Primary Care Physician Wound #7 Right,Dorsal Metatarsal head first: [place] Visits - [facility] weekly, [facility]-Wednesday, teach family member to change on off day. Home Health Nurse may visit PRN to address patient s wound care needs. FACE TO FACE ENCOUNTER: MEDICARE and MEDICAID PATIENTS: I certify that this patient is under my care and that I had a face-to-face encounter that meets the physician face-to-face encounter requirements with this patient on this date. The encounter with the patient was in whole or in part for the following MEDICAL CONDITION: (primary reason for [place]) MEDICAL NECESSITY: I certify, that based on my findings, NURSING services are a medically necessary home health service. HOME BOUND STATUS: I certify that my clinical findings support that this patient is homebound (i.e., Due to illness or injury, pt requires aid of supportive devices such as crutches, cane, wheelchairs, walkers, the use of special transportation or the assistance of another person to leave their place of residence. There is a normal inability to leave the home and doing so requires considerable and taxing effort. Other absences are for medical reasons / religious services and are infrequent or of short duration when for other reasons). Lafata, Artie V. ([id number]) If current dressing causes regression in wound condition, may D/C ordered dressing product/s and apply Normal Saline Moist Dressing daily until next [place] / Other MD appointment. [place] of regression in wound condition at  [phone]. Please direct any NON-WOUND related issues/requests for orders to patient's Primary Care Physician Wound #8 Right,Dorsal Toe Third: [place] Visits - [facility] weekly, [facility]-Wednesday, teach family member to change on off day. Home Health Nurse may visit PRN to address patient s wound care needs. FACE TO FACE ENCOUNTER: MEDICARE and MEDICAID PATIENTS: I certify that this patient is under my care and that I had a face-to-face encounter that meets the physician face-to-face encounter requirements with this patient on this date. The encounter with the patient was in whole or in part for the following MEDICAL CONDITION: (primary reason for [place]) MEDICAL NECESSITY: I certify, that based on my findings, NURSING services are a medically necessary home health service. HOME BOUND STATUS: I certify that my clinical findings support that this patient is homebound (i.e., Due to illness or injury, pt requires aid of supportive devices such as crutches, cane, wheelchairs, walkers, the use of special transportation or the assistance of another person to leave their place of residence. There is a normal inability to leave the home and doing so requires considerable and taxing effort. Other absences are for medical reasons / religious services and are infrequent or of short duration when for other reasons). If current dressing causes regression in wound condition, may D/C ordered dressing product/s and apply Normal Saline Moist Dressing daily until next [place] / Other MD appointment. [place] of regression in wound condition at [phone]. Please direct any NON-WOUND related issues/requests for orders to patient's Primary Care Physician Wound #9 Right,Distal,Lateral Foot: [place] Visits - [facility] twice weekly, [facility]-Wednesday, teach family member to change on off day. Home Health Nurse may visit  [facility] to address patient s wound care needs. FACE TO FACE ENCOUNTER: MEDICARE and MEDICAID PATIENTS: I certify that this patient is under my care and that I had a face-to-face encounter that meets the physician face-to-face encounter requirements with this patient on this date. The  encounter with the patient was in whole or in part for the following MEDICAL CONDITION: (primary reason for [place]) MEDICAL NECESSITY: I certify, that based on my findings, NURSING services are a medically necessary home health service. HOME BOUND STATUS: I certify that my clinical findings support that this patient is homebound (i.e., Due to illness or injury, pt requires aid of supportive devices such as crutches, cane, wheelchairs, walkers, the use of special transportation or the assistance of another person to leave their place of residence. There is a normal inability to leave the home and doing so requires considerable and taxing effort. Other absences are for medical reasons / religious services and are infrequent or of short duration when for other reasons). If current dressing causes regression in wound condition, may D/C ordered dressing product/s and apply Normal Saline Moist Dressing daily until next [place] / Other MD appointment. [place] of regression in wound condition at [phone]. Please direct any NON-WOUND related issues/requests for orders to patient's Primary Care Physician Medications-please add to medication list.: Wound #7 Right,Dorsal Metatarsal head first: Santyl Enzymatic Ointment Wound #8 Right,Dorsal Toe Third: Santyl Enzymatic Ointment Wound #9 Right,Distal,Lateral Foot: Santyl Enzymatic Ointment Moquin, Maud V. ([id number]) #1 Prisma to the 2 wounds on the right leg #2 Santyl to the wounds on the right foot. #3 in spite of her known PAD status post revascularization we continued to make gradual progress in all of these areas. Electronic  Signature(s) Signed: 12/03/2016 4:07:46 PM By: Gretta Cool RN, BSN, Kim RN, BSN Signed: 12/04/2016 7:54:35 AM By: Linton Ham MD Previous Signature: 11/27/2016 4:33:23 PM Version By: Linton Ham MD Entered By: Gretta Cool RN, BSN, Kim on 12/03/2016 16:07:45 Conigliaro, Umeka [name] ([id number]) -------------------------------------------------------------------------------- SuperBill Details Patient Name: Woolf, Sherrey V. Date of Service: 11/27/2016 Medical Record Patient Account Number: [id number] Number: Treating RN: Ahmed Prima 05/19/1941 (76 y.o. 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 diabetic peripheral angiopathy with gangrene I70.232 Atherosclerosis 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 of calf I70.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 of foot Facility Procedures CPT4: Description Modifier Quantity Code [id number] 11042 - DEB SUBQ TISSUE 20 SQ CM/< 1 ICD-10 Description Diagnosis E11.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 ([id number]) Electronic Signature(s) Signed: 11/27/2016 4:33:23 PM By: Linton Ham MD Entered By: Linton Ham on 11/27/2016 10:09:19

## 2016-11-28 NOTE — Progress Notes (Signed)
DALIA, JOLLIE ([id number]) Visit Report for 11/27/2016 Arrival Information Details Patient Name: [name], [name] Date of Service: 11/27/2016 8:45 AM Medical Record Number: [id number] Patient Account Number: [id number] Date of Birth/Sex: 1941/04/01 (76 y.o. Female) Treating RN: Carolyne Fiscal, Debi Primary Care Hayato Guaman: Beverlyn Roux Other Clinician: Referring Marquisha Nikolov: Beverlyn Roux Treating Jahaad Penado/Extender: Tito Dine in Treatment: 3 Visit Information History Since Last Visit All ordered tests and consults were completed: No Patient Arrived: Wheel Chair Added or deleted any medications: No Arrival Time: 08:54 Any new allergies or adverse reactions: No Accompanied By: husband Had a fall or experienced change in No Transfer Assistance: EasyP[employer] activities of daily living that may affect Patient Lift risk of falls: Patient Identification Verified: Yes Signs or symptoms of abuse/neglect since last No Secondary Verification Process Yes visito Completed: Hospitalized since last visit: No Patient Requires Transmission- No Has Dressing in Place as Prescribed: Yes Based Precautions: Pain Present Now: No Patient Has Alerts: No Electronic Signature(s) Signed: 11/27/2016 4:45:04 PM By: Alric Quan Entered By: Alric Quan on 11/27/2016 08:57:50 Sudol, Kaleyah V. ([id number]) -------------------------------------------------------------------------------- Encounter Discharge Information Details Patient Name: Adriance, Brigitta V. Date of Service: 11/27/2016 8:45 AM Medical Record Number: [id number] Patient Account Number: [id number] Date of Birth/Sex: [birth year] (76 y.o. Female) Treating RN: Carolyne Fiscal, Debi Primary Care Kemontae Dunklee: Beverlyn Roux Other Clinician: Referring Edra Riccardi: Beverlyn Roux Treating Marija Calamari/Extender: Tito Dine in Treatment: 3 Encounter Discharge Information Items Discharge Pain Level: 0 Discharge Condition: Stable Ambulatory Status:  Wheelchair Discharge Destination: Home Transportation: Private Auto Accompanied By: husband Schedule Follow-up Appointment: Yes Medication Reconciliation completed and provided to Patient/Care No Mahamed Zalewski: Provided on Clinical Summary of Care: 11/27/2016 Form Type Recipient Paper Patient [facility] Electronic Signature(s) Signed: 11/27/2016 9:52:56 AM By: Ruthine Dose Entered By: Ruthine Dose on 11/27/2016 09:52:56 Mcphee, Prestyn V. ([id number]) -------------------------------------------------------------------------------- Lower Extremity Assessment Details Patient Name: Hagedorn, Jahnessa V. Date of Service: 11/27/2016 8:45 AM Medical Record Number: [id number] Patient Account Number: [id number] Date of Birth/Sex: Nov 27, 1940 (76 y.o. Female) Treating RN: Carolyne Fiscal, Debi Primary Care Genasis Zingale: Beverlyn Roux Other Clinician: Referring Traeger Sultana: Beverlyn Roux Treating Airi Copado/Extender: Tito Dine in Treatment: 3 Edema Assessment Assessed: [Left: No] [Right: No] E[Left: dema] [Right: :] Calf Left: Right: Point of Measurement: 34 cm From Medial Instep cm 44.7 cm Ankle Left: Right: Point of Measurement: 11 cm From Medial Instep cm 28.3 cm Vascular Assessment Pulses: Dorsalis Pedis Palpable: [Right:No] Doppler Audible: [Right:Yes] Posterior Tibial Extremity colors, hair growth, and conditions: Extremity Color: [Right:Hyperpigmented] Temperature of Extremity: [Right:Cool] Capillary Refill: [Right:< 3 seconds] Toe Nail Assessment Left: Right: Thick: Yes Discolored: Yes Deformed: Yes Improper Length and Hygiene: No Electronic Signature(s) Signed: 11/27/2016 4:45:04 PM By: Alric Quan Entered By: Alric Quan on 11/27/2016 09:12:08 Bartolome, Maleny V. ([id number]) -------------------------------------------------------------------------------- Multi Wound Chart Details Patient Name: Dokken, Jakeisha V. Date of Service: 11/27/2016 8:45 AM Medical Record Number:  [id number] Patient Account Number: [id number] Date of Birth/Sex: 12-Feb-1941 (76 y.o. Female) Treating RN: Carolyne Fiscal, Debi Primary Care Tashunda Vandezande: Beverlyn Roux Other Clinician: Referring Adelfo Diebel: Beverlyn Roux Treating Nayzeth Altman/Extender: Tito Dine in Treatment: 3 Vital Signs Height(in): 64 Pulse(bpm): 80 Weight(lbs): 200 Blood Pressure 156/58 (mmHg): Body Mass Index(BMI): 34 Temperature(F): 97.5 Respiratory Rate 18 (breaths/min): Photos: [5:No Photos] [6:No Photos] [7:No Photos] Wound Location: [5:Right Lower Leg - DistalRight Lower Leg -] [6:Proximal] [7:Right Metatarsal head first - Dorsal] Wounding Event: [5:Gradually Appeared] [6:Gradually Appeared] [7:Gradually Appeared] 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] [6:the Lower Extremity] [7:Diabetic Wound/Ulcer of the Lower Extremity]  Comorbid History: [5:Cataracts, Chronic sinus Cataracts, Chronic sinus problems/congestion, Congestive Heart Failure, Congestive Heart Failure, Hypertension, Type II Diabetes] [6:problems/congestion, Hypertension, Type II Diabetes] [7:Cataracts, Chronic  sinus problems/congestion, Congestive Heart Failure, Hypertension, Type II Diabetes] Date Acquired: [5:09/09/2016] [6:09/09/2016] [7:08/06/2013] Weeks of Treatment: [5:3] [6:3] [7:3] Wound Status: [5:Open] [6:Open] [7:Open] Measurements L x W x D 0.2x0.2x0.1 [6:0.8x0.4x0.1] [7:2.2x0.6x0.1] (cm) Area (cm) : [5:0.031] [6:0.251] [7:1.037] Volume (cm) : [5:0.003] [6:0.025] [7:0.104] % Reduction in Area: [5:98.20%] [6:20.10%] [7:12.00%] % Reduction in Volume: 98.30% [6:19.40%] [7:70.50%] Classification: [5:Grade 1] [6:Grade 2] [7:Grade 2] Exudate Amount: [5:Small] [6:Large] [7:Large] Exudate Type: [5:Serous] [6:Serous] [7:Serous] Exudate Color: [5:amber] [6:amber] [7:amber] Wound Margin: [5:Flat and Intact] [6:Flat and Intact] [7:Flat and Intact] Granulation Amount: [5:Large (67-100%)] [6:None  Present (0%)] [7:None Present (0%)] Granulation Quality: [5:Pink, Friable] [6:N/A] [7:N/A] Necrotic Amount: [5:None Present (0%)] [6:Large (67-100%)] [7:Large (67-100%)] Necrotic Tissue: [5:N/A] [6:Adherent Slough] [7:Adherent Slough] Exposed Structures: Pall, Amberlin V. ([id number]) Fat Layer (Subcutaneous Fat Layer (Subcutaneous Fat Layer (Subcutaneous Tissue) Exposed: Yes Tissue) Exposed: Yes Tissue) Exposed: Yes Fascia: No Fascia: No Fascia: No Tendon: No Tendon: No Tendon: No Muscle: No Muscle: No Muscle: No Joint: No Joint: No Joint: No Bone: No Bone: No Bone: No Epithelialization: Large (67-100%) Small (1-33%) None Debridement: N/A N/A Debridement ([id number] 11047) Pre-procedure N/A N/A 09:32 Verification/Time Out Taken: Pain Control: N/A N/A Lidocaine 4% Topical Solution Tissue Debrided: N/A N/A Fibrin/Slough, Exudates, Subcutaneous Level: N/A N/A Skin/Subcutaneous Tissue Debridement Area (sq N/A N/A 1.32 cm): Instrument: N/A N/A Curette Bleeding: N/A N/A Minimum Hemostasis Achieved: N/A N/A Pressure Procedural Pain: N/A N/A 0 Post Procedural Pain: N/A N/A 0 Debridement Treatment N/A N/A Procedure was tolerated Response: well Post Debridement N/A N/A 2.2x0.6x0.2 Measurements L x W x D (cm) Post Debridement N/A N/A 0.207 Volume: (cm) Periwound Skin Texture: Scarring: Yes Scarring: Yes Scarring: Yes Excoriation: No Excoriation: No Induration: No Induration: No Callus: No Callus: No Crepitus: No Crepitus: No Rash: No Rash: No Periwound Skin Maceration: Yes Maceration: No Maceration: Yes Moisture: Dry/Scaly: No Dry/Scaly: No Periwound Skin Color: Hemosiderin Staining: Yes Hemosiderin Staining: Yes Ecchymosis: Yes Atrophie Blanche: No Atrophie Blanche: No Hemosiderin Staining: Yes Cyanosis: No Cyanosis: No Mottled: Yes Ecchymosis: No Ecchymosis: No Erythema: No Erythema: No Mottled: No Mottled: No Pallor: No Pallor: No Rubor:  No Rubor: No Temperature: No Abnormality No Abnormality No Abnormality Turck, Alice V. 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 the Lower Extremity Comorbid History: Cataracts, Chronic sinus Cataracts, Chronic sinus N/A problems/congestion, problems/congestion, Congestive Heart Failure, Congestive Heart Failure, Hypertension, Type II Hypertension, Type II Diabetes Diabetes Date Acquired: 10/28/2016 10/14/2016 N/A Weeks of Treatment: 3 3 N/A Wound Status: Open Open N/A Measurements L x W x D 0.5x0.5x0.1 0.2x0.3x0.1 N/A (cm) Area (cm) : 0.196 0.047 N/A Volume (cm) : 0.02 0.005 N/A % Reduction in Area: 74.00% 95.20% N/A % Reduction in Volume: 73.30% 97.40% N/A Classification: Unable to visualize wound Grade 1 N/A bed Exudate Amount: Large Large N/A Exudate Type: Serous Serous N/A Exudate Color: amber amber N/A Wound Margin: Flat  and Intact Indistinct, nonvisible N/A Granulation Amount: None Present (0%) None Present (0%) N/A Granulation Quality: N/A N/A N/A Necrotic Amount: Large (67-100%) Large (67-100%) N/A Necrotic Tissue: Eschar, Adherent Slough Adherent Slough N/A Exposed Structures: Fascia: No Fat Layer (Subcutaneous N/A Fat Layer (Subcutaneous Tissue) Exposed: Yes Tissue) Exposed: No Fascia: No Tendon: No Tendon: No Muscle: No Muscle: No Joint: No Joint:  No Bone: No Bone: No Limited to Skin Breakdown Domino, Magenta V. ([id number]) Epithelialization: None Small (1-33%) N/A Debridement: Debridement (57846- N/A N/A 11047) Pre-procedure 09:32 N/A N/A Verification/Time Out Taken: Pain Control: Lidocaine 4% Topical N/A N/A Solution Tissue Debrided: Fibrin/Slough, Exudates, N/A N/A Subcutaneous Level: Skin/Subcutaneous N/A N/A Tissue Debridement Area (sq 0.25 N/A N/A cm): Instrument: Curette N/A N/A Bleeding: Minimum N/A N/A Hemostasis Achieved: Pressure N/A N/A Procedural Pain: 0 N/A N/A Post Procedural Pain: 0 N/A N/A Debridement Treatment Procedure was tolerated N/A N/A Response: well Post Debridement 0.5x0.5x0.1 N/A N/A Measurements L x W x D (cm) Post Debridement 0.02 N/A N/A Volume: (cm) Periwound Skin Texture: No Abnormalities Noted Excoriation: No N/A Induration: No Callus: No Crepitus: No Rash: No Scarring: No Periwound Skin Maceration: Yes Maceration: Yes N/A Moisture: Dry/Scaly: No Dry/Scaly: No Periwound Skin Color: No Abnormalities Noted Hemosiderin Staining: Yes N/A Atrophie Blanche: No Cyanosis: No Ecchymosis: No Erythema: No Mottled: No Pallor: No Rubor: No Temperature: No Abnormality No Abnormality N/A Tenderness on Yes Yes N/A Palpation: Wound Preparation: Ulcer Cleansing: Ulcer Cleansing: N/A Rinsed/Irrigated with Rinsed/Irrigated with Saline Saline Topical Anesthetic Topical Anesthetic Tebbetts, Jahara V. ([id number]) Applied: Other: lidocaine Applied: Other: lidocaine 4% 4% Procedures Performed: Debridement N/A N/A Treatment Notes Electronic Signature(s) Signed: 11/27/2016 4:33:23 PM By: Linton Ham MD Entered By: Linton Ham on 11/27/2016 10:04:05 Ebeling, Shatira [name] ([id number]) -------------------------------------------------------------------------------- Multi-Disciplinary Care Plan Details Patient Name: Shoe, Eilene V. Date of Service: 11/27/2016 8:45 AM Medical Record Number:  [id number] Patient Account Number: [id number] Date of Birth/Sex: [birth year] (76 y.o. Female) Treating RN: Carolyne Fiscal, Debi Primary Care Nolyn Eilert: Beverlyn Roux Other Clinician: Referring Kentrell Guettler: Beverlyn Roux Treating Colene Mines/Extender: Tito Dine in Treatment: 3 Active Inactive ` Abuse / Safety / Falls / Self Care Management Nursing Diagnoses: Impaired physical mobility Potential for falls Goals: Patient will remain injury free Date Initiated: 11/06/2016 Target Resolution Date: 12/30/2016 Goal Status: Active Patient/caregiver will verbalize understanding of skin care regimen Date Initiated: 11/06/2016 Target Resolution Date: 12/30/2016 Goal Status: Active Interventions: Assess fall risk on admission and as needed Treatment Activities: Patient referred to home care : 11/06/2016 Notes: ` Nutrition Nursing Diagnoses: Potential for alteratiion in Nutrition/Potential for imbalanced nutrition Goals: Patient/caregiver verbalizes understanding of need to maintain therapeutic glucose control per primary care physician Date Initiated: 11/06/2016 Target Resolution Date: 12/30/2016 Goal Status: Active Interventions: Provide education on elevated blood sugars and impact on wound healing Willcutt, Jessieca V. ([id number]) Notes: ` Orientation to the Wound Care Program Nursing Diagnoses: Knowledge deficit related to the wound healing center program Goals: Patient/caregiver will verbalize understanding of the [place] Date Initiated: 11/06/2016 Target Resolution Date: 12/30/2016 Goal Status: Active Interventions: Provide education on orientation to the wound center Notes: ` Venous Leg Ulcer Nursing Diagnoses: Actual venous Insuffiency (use after diagnosis is confirmed) Knowledge deficit related to disease process and management Goals: Non-invasive venous studies are completed as ordered Date Initiated: 11/06/2016 Target Resolution Date: 12/30/2016 Goal Status:  Active Patient/caregiver will verbalize understanding of disease process and disease management Date Initiated: 11/06/2016 Target Resolution Date: 12/30/2016 Goal Status: Active Interventions: Assess peripheral edema status every visit.  Notes: ` Wound/Skin Impairment Nursing Diagnoses: Impaired tissue integrity Knowledge deficit related to smoking impact on wound healing Knowledge deficit related to ulceration/compromised skin integrity Goals: Coleson, Gerrianne V. ([id number]) Ulcer/skin breakdown will heal within 14 weeks Date Initiated: 11/06/2016 Target Resolution Date: 02/05/2017 Goal Status: Active Interventions: Assess ulceration(s) every visit Treatment Activities: Skin care regimen initiated : 11/06/2016 Notes: Electronic Signature(s) Signed: 11/27/2016 4:45:04 PM By: Alric Quan Entered By: Alric Quan on 11/27/2016 09:26:03 Lapp, Vy V. ([id number]) -------------------------------------------------------------------------------- Pain Assessment Details Patient Name: Mcglory, Erendida V. Date of Service: 11/27/2016 8:45 AM Medical Record Number: [id number] Patient Account Number: [id number] Date of Birth/Sex: 1940-10-01 (76 y.o. Female) Treating RN: Carolyne Fiscal, Debi Primary Care Utah Delauder: Beverlyn Roux Other Clinician: Referring Aadam Zhen: Beverlyn Roux Treating Demond Shallenberger/Extender: Tito Dine in Treatment: 3 Active Problems Location of Pain Severity and Description of Pain Patient Has Paino No Site Locations With Dressing Change: No Pain Management and Medication Current Pain Management: Electronic Signature(s) Signed: 11/27/2016 4:45:04 PM By: Alric Quan Entered By: Alric Quan on 11/27/2016 08:57:56 Campanelli, Shaylene V. ([id number]) -------------------------------------------------------------------------------- Patient/Caregiver Education Details Patient Name: Clarkston, Cami V. Date of Service: 11/27/2016 8:45 AM Medical Record Patient Account Number:  [id number] Number: Treating RN: Ahmed Prima April 17, 1941 (76 y.o. Other Clinician: Date of Birth/Gender: Female) Treating ROBSON, [name] Primary Care Physician: Beverlyn Roux Physician/Extender: G Referring Physician: Dorthula Nettles in Treatment: 3 Education Assessment Education Provided To: Patient Education Topics Provided Wound/Skin Impairment: Handouts: Other: change dressing as ordered Methods: Demonstration, Explain/Verbal Responses: State content correctly Electronic Signature(s) Signed: 11/27/2016 4:45:04 PM By: Alric Quan Entered By: Alric Quan on 11/27/2016 09:27:21 Jock, Mika V. ([id number]) -------------------------------------------------------------------------------- Wound Assessment Details Patient Name: Goldblatt, Briany V. Date of Service: 11/27/2016 8:45 AM Medical Record Number: [id number] Patient Account Number: [id number] Date of Birth/Sex: 1940-10-15 (76 y.o. Female) Treating RN: Carolyne Fiscal, Debi Primary Care Zariel Capano: Beverlyn Roux Other Clinician: Referring Demeco Ducksworth: Beverlyn Roux Treating Llewellyn Schoenberger/Extender: Tito Dine in Treatment: 3 Wound Status Wound Number: 5 Primary Diabetic Wound/Ulcer of the Lower Etiology: Extremity Wound Location: Right Lower Leg - Distal Wound Open Wounding Event: Gradually Appeared Status: Date Acquired: 09/09/2016 Comorbid Cataracts, Chronic sinus Weeks Of Treatment: 3 History: problems/congestion, Congestive Heart Clustered Wound: No Failure, Hypertension, Type II Diabetes Photos Photo Uploaded By: Alric Quan on 11/27/2016 11:40:40 Wound Measurements Length: (cm) 0.2 Width: (cm) 0.2 Depth: (cm) 0.1 Area: (cm) 0.031 Volume: (cm) 0.003 % Reduction in Area: 98.2% % Reduction in Volume: 98.3% Epithelialization: Large (67-100%) Tunneling: No Undermining: No Wound Description Classification: Grade 1 Foul Odor Afte Wound Margin: Flat and Intact Slough/Fibrino Exudate  Amount: Small Exudate Type: Serous Exudate Color: amber r Cleansing: No No Wound Bed Granulation Amount: Large (67-100%) Exposed Structure Granulation Quality: Pink, Friable Fascia Exposed: No Necrotic Amount: None Present (0%) Fat Layer (Subcutaneous Tissue) Exposed: Yes Tendon Exposed: No Miao, Persephonie V. ([id number]) Muscle Exposed: No Joint Exposed: No Bone Exposed: No Periwound Skin Texture Texture Color No Abnormalities Noted: No No Abnormalities Noted: No Callus: No Atrophie Blanche: No Crepitus: No Cyanosis: No Excoriation: No Ecchymosis: No Induration: No Erythema: No Rash: No Hemosiderin Staining: Yes Scarring: Yes Mottled: No Pallor: No Moisture Rubor: No No Abnormalities Noted: No Dry / Scaly: No Temperature / Pain Maceration: Yes Temperature: No Abnormality Tenderness on Palpation: Yes Wound Preparation Ulcer Cleansing: Rinsed/Irrigated with Saline Topical Anesthetic Applied: None Electronic Signature(s) Signed: 11/27/2016 4:45:04 PM By: Alric Quan Entered By: Alric Quan on 11/27/2016 09:07:56 Memon, Mallie V. ([id number]) -------------------------------------------------------------------------------- Wound Assessment Details Patient Name: Giorgio,  Denyse V. Date of Service: 11/27/2016 8:45 AM Medical Record Number: [id number] Patient Account Number: [id number] Date of Birth/Sex: [birth year] (76 y.o. Female) Treating RN: Carolyne Fiscal, Debi Primary Care Cesily Cuoco: Beverlyn Roux Other Clinician: Referring Ellanore Vanhook: Beverlyn Roux Treating Addalynn Kumari/Extender: Tito Dine in Treatment: 3 Wound Status Wound Number: 6 Primary Diabetic Wound/Ulcer of the Lower Etiology: Extremity Wound Location: Right Lower Leg - Proximal Wound Open Wounding Event: Gradually Appeared Status: Date Acquired: 09/09/2016 Comorbid Cataracts, Chronic sinus Weeks Of Treatment: 3 History: problems/congestion, Congestive Heart Clustered Wound: No Failure,  Hypertension, Type II Diabetes Photos Photo Uploaded By: Alric Quan on 11/27/2016 11:40:40 Wound Measurements Length: (cm) 0.8 Width: (cm) 0.4 Depth: (cm) 0.1 Area: (cm) 0.251 Volume: (cm) 0.025 % Reduction in Area: 20.1% % Reduction in Volume: 19.4% Epithelialization: Small (1-33%) Tunneling: No Undermining: No Wound Description Classification: Grade 2 Wound Margin: Flat and Intact Exudate Amount: Large Exudate Type: Serous Exudate Color: amber Foul Odor After Cleansing: No Slough/Fibrino Yes Wound Bed Granulation Amount: None Present (0%) Exposed Structure Necrotic Amount: Large (67-100%) Fascia Exposed: No Necrotic Quality: Adherent Slough Fat Layer (Subcutaneous Tissue) Exposed: Yes Tendon Exposed: No Rausch, Tayvia V. ([id number]) Muscle Exposed: No Joint Exposed: No Bone Exposed: No Periwound Skin Texture Texture Color No Abnormalities Noted: No No Abnormalities Noted: No Callus: No Atrophie Blanche: No Crepitus: No Cyanosis: No Excoriation: No Ecchymosis: No Induration: No Erythema: No Rash: No Hemosiderin Staining: Yes Scarring: Yes Mottled: No Pallor: No Moisture Rubor: No No Abnormalities Noted: No Dry / Scaly: No Temperature / Pain Maceration: No Temperature: No Abnormality Tenderness on Palpation: Yes Wound Preparation Ulcer Cleansing: Rinsed/Irrigated with Saline Topical Anesthetic Applied: Other: lidocaine 4%, Electronic Signature(s) Signed: 11/27/2016 4:45:04 PM By: Alric Quan Entered By: Alric Quan on 11/27/2016 09:07:13 Santoro, Abbeygail V. ([id number]) -------------------------------------------------------------------------------- Wound Assessment Details Patient Name: Haller, Matylda V. Date of Service: 11/27/2016 8:45 AM Medical Record Number: [id number] Patient Account Number: [id number] Date of Birth/Sex: 01/04/1941 (76 y.o. Female) Treating RN: Carolyne Fiscal, Debi Primary Care Mega Kinkade: Beverlyn Roux Other  Clinician: Referring Kihanna Kamiya: Beverlyn Roux Treating Hurbert Duran/Extender: Tito Dine in Treatment: 3 Wound Status Wound Number: 7 Primary Diabetic Wound/Ulcer of the Lower Etiology: Extremity Wound Location: Right Metatarsal head first - Dorsal Wound Open Status: Wounding Event: Gradually Appeared Comorbid Cataracts, Chronic sinus Date Acquired: 08/06/2013 History: problems/congestion, Congestive Heart Weeks Of Treatment: 3 Failure, Hypertension, Type II Diabetes Clustered Wound: No Photos Photo Uploaded By: Alric Quan on 11/27/2016 11:41:22 Wound Measurements Length: (cm) 2.2 Width: (cm) 0.6 Depth: (cm) 0.1 Area: (cm) 1.037 Volume: (cm) 0.104 % Reduction in Area: 12% % Reduction in Volume: 70.5% Epithelialization: None Tunneling: No Undermining: No Wound Description Classification: Grade 2 Wound Margin: Flat and Intact Exudate Amount: Large Exudate Type: Serous Exudate Color: amber Foul Odor After Cleansing: No Slough/Fibrino Yes Wound Bed Granulation Amount: None Present (0%) Exposed Structure Necrotic Amount: Large (67-100%) Fascia Exposed: No Necrotic Quality: Adherent Slough Fat Layer (Subcutaneous Tissue) Exposed: Yes Foley, Lakyia V. ([id number]) Tendon Exposed: No Muscle Exposed: No Joint Exposed: No Bone Exposed: No Periwound Skin Texture Texture Color No Abnormalities Noted: No No Abnormalities Noted: No Scarring: Yes Ecchymosis: Yes Hemosiderin Staining: Yes Moisture Mottled: Yes No Abnormalities Noted: No Maceration: Yes Temperature / Pain Temperature: No Abnormality Tenderness on Palpation: Yes Wound Preparation Ulcer Cleansing: Rinsed/Irrigated with Saline Topical Anesthetic Applied: Other: lidociane 4%, Electronic Signature(s) Signed: 11/27/2016 4:45:04 PM By: Alric Quan Entered By: Alric Quan on 11/27/2016 09:08:41 Schnoebelen, Kampbell V.  ([id number]) -------------------------------------------------------------------------------- Wound Assessment Details Patient  Name: Denes, Zenobia V. Date of Service: 11/27/2016 8:45 AM Medical Record Number: [id number] Patient Account Number: [id number] Date of Birth/Sex: [birth year] (76 y.o. Female) Treating RN: Carolyne Fiscal, Debi Primary Care Shermar Friedland: Beverlyn Roux Other Clinician: Referring Epimenio Schetter: Beverlyn Roux Treating Jahkari Maclin/Extender: Tito Dine in Treatment: 3 Wound Status Wound Number: 8 Primary Diabetic Wound/Ulcer of the Lower Etiology: Extremity Wound Location: Right Toe Third - Dorsal Wound Open Wounding Event: Gradually Appeared Status: Date Acquired: 10/28/2016 Comorbid Cataracts, Chronic sinus Weeks Of Treatment: 3 History: problems/congestion, Congestive Heart Clustered Wound: No Failure, Hypertension, Type II Diabetes Photos Photo Uploaded By: Alric Quan on 11/27/2016 11:41:22 Wound Measurements Length: (cm) 0.5 Width: (cm) 0.5 Depth: (cm) 0.1 Area: (cm) 0.196 Volume: (cm) 0.02 % Reduction in Area: 74% % Reduction in Volume: 73.3% Epithelialization: None Tunneling: No Undermining: No Wound Description Classification: Unable to visualize wound bed Wound Margin: Flat and Intact Exudate Amount: Large Exudate Type: Serous Exudate Color: amber Foul Odor After Cleansing: No Slough/Fibrino Yes Wound Bed Granulation Amount: None Present (0%) Exposed Structure Necrotic Amount: Large (67-100%) Fascia Exposed: No Necrotic Quality: Eschar, Adherent Slough Fat Layer (Subcutaneous Tissue) Exposed: No Tendon Exposed: No Shuart, Gera V. ([id number]) Muscle Exposed: No Joint Exposed: No Bone Exposed: No Limited to Skin Breakdown Periwound Skin Texture Texture Color No Abnormalities Noted: No No Abnormalities Noted: No Moisture Temperature / Pain No Abnormalities Noted: No Temperature: No Abnormality Dry / Scaly: No Tenderness on  Palpation: Yes Maceration: Yes Wound Preparation Ulcer Cleansing: Rinsed/Irrigated with Saline Topical Anesthetic Applied: Other: lidocaine 4%, Electronic Signature(s) Signed: 11/27/2016 4:45:04 PM By: Alric Quan Entered By: Alric Quan on 11/27/2016 09:09:24 Godek, Roneisha V. ([id number]) -------------------------------------------------------------------------------- Wound Assessment Details Patient Name: Swartout, Merrit V. Date of Service: 11/27/2016 8:45 AM Medical Record Number: [id number] Patient Account Number: [id number] Date of Birth/Sex: 1941/02/21 (76 y.o. 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, Hypertension, Type II Diabetes Photos Photo Uploaded By: Alric Quan on 11/27/2016 11:41:55 Wound Measurements Length: (cm) 0.2 Width: (cm) 0.3 Depth: (cm) 0.1 Area: (cm) 0.047 Volume: (cm) 0.005 % Reduction in Area: 95.2% % Reduction in Volume: 97.4% Epithelialization: Small (1-33%) Tunneling: No Undermining: No Wound Description Classification: Grade 1 Wound Margin: Indistinct, nonvisible Exudate Amount: Large Exudate Type: Serous Exudate Color: amber Foul Odor After Cleansing: No Slough/Fibrino Yes Wound Bed Granulation Amount: None Present (0%) Exposed Structure Necrotic Amount: Large (67-100%) Fascia Exposed: No Necrotic Quality: Adherent Slough Fat Layer (Subcutaneous Tissue) Exposed: Yes Tendon Exposed: No [name], Darcelle V. ([id number]) Muscle  Exposed: No Joint Exposed: No Bone Exposed: No Periwound Skin Texture Texture Color No Abnormalities Noted: No No Abnormalities Noted: No Callus: No Atrophie Blanche: No Crepitus: No Cyanosis: No Excoriation: No Ecchymosis: No Induration: No Erythema: No Rash: No Hemosiderin Staining: Yes Scarring: No Mottled: No Pallor: No Moisture Rubor: No No Abnormalities Noted: No Dry / Scaly: No Temperature / Pain Maceration: Yes Temperature: No Abnormality Tenderness on Palpation: Yes Wound Preparation Ulcer Cleansing: Rinsed/Irrigated with Saline Topical Anesthetic Applied: Other: lidocaine 4%, Electronic Signature(s) Signed: 11/27/2016 4:45:04 PM By: Alric Quan Entered By: Alric Quan on 11/27/2016 09:10:05 Tesoro, Bellanie V. ([id number]) -------------------------------------------------------------------------------- Vitals Details  Patient Name: Houpt, Jenny V. Date of Service: 11/27/2016 8:45 AM Medical Record Number: [id number] Patient Account Number: [id number] Date of Birth/Sex: [birth year] (76 y.o. Female) Treating RN: Carolyne Fiscal, Debi Primary Care Kynadee Dam: Beverlyn Roux Other Clinician: Referring Clarece Drzewiecki: Beverlyn Roux Treating Yvett Rossel/Extender: Tito Dine in Treatment: 3 Vital Signs Time Taken: 08:58 Temperature (F): 97.5 Height (in): 64 Pulse (bpm): 80 Weight (lbs): 200 Respiratory Rate (breaths/min): 18 Body Mass Index (BMI): 34.3 Blood Pressure (mmHg): 156/58 Reference Range: 80 - 120 mg / dl Pulse Oximetry (%): 97 Electronic Signature(s) Signed: 11/27/2016 4:45:04 PM By: Alric Quan Entered By: Alric Quan on 11/27/2016 08:59:44

## 2016-12-04 ENCOUNTER — Encounter: Payer: Medicare HMO | Attending: Internal Medicine | Admitting: Internal Medicine

## 2016-12-04 DIAGNOSIS — Z91012 Allergy to eggs: Secondary | ICD-10-CM | POA: Diagnosis not present

## 2016-12-04 DIAGNOSIS — Z8673 Personal history of transient ischemic attack (TIA), and cerebral infarction without residual deficits: Secondary | ICD-10-CM | POA: Diagnosis not present

## 2016-12-04 DIAGNOSIS — E11621 Type 2 diabetes mellitus with foot ulcer: Secondary | ICD-10-CM | POA: Insufficient documentation

## 2016-12-04 DIAGNOSIS — Z880 Allergy status to penicillin: Secondary | ICD-10-CM | POA: Diagnosis not present

## 2016-12-04 DIAGNOSIS — L97219 Non-pressure chronic ulcer of right calf with unspecified severity: Secondary | ICD-10-CM | POA: Diagnosis not present

## 2016-12-04 DIAGNOSIS — E11622 Type 2 diabetes mellitus with other skin ulcer: Secondary | ICD-10-CM | POA: Insufficient documentation

## 2016-12-04 DIAGNOSIS — L97519 Non-pressure chronic ulcer of other part of right foot with unspecified severity: Secondary | ICD-10-CM | POA: Insufficient documentation

## 2016-12-04 DIAGNOSIS — I110 Hypertensive heart disease with heart failure: Secondary | ICD-10-CM | POA: Insufficient documentation

## 2016-12-04 DIAGNOSIS — I70232 Atherosclerosis 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 of calf: Secondary | ICD-10-CM | POA: Insufficient documentation

## 2016-12-04 DIAGNOSIS — I70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 of foot: Secondary | ICD-10-CM | POA: Insufficient documentation

## 2016-12-04 DIAGNOSIS — Z882 Allergy status to sulfonamides status: Secondary | ICD-10-CM | POA: Insufficient documentation

## 2016-12-04 DIAGNOSIS — Z91013 Allergy to seafood: Secondary | ICD-10-CM | POA: Insufficient documentation

## 2016-12-04 DIAGNOSIS — D649 Anemia, unspecified: Secondary | ICD-10-CM | POA: Diagnosis not present

## 2016-12-04 DIAGNOSIS — E1152 Type 2 diabetes mellitus with diabetic peripheral angiopathy with gangrene: Secondary | ICD-10-CM | POA: Diagnosis not present

## 2016-12-04 DIAGNOSIS — I509 Heart failure, unspecified: Secondary | ICD-10-CM | POA: Insufficient documentation

## 2016-12-04 DIAGNOSIS — I350 Nonrheumatic aortic (valve) stenosis: Secondary | ICD-10-CM | POA: Diagnosis not present

## 2016-12-04 DIAGNOSIS — E43 Unspecified severe protein-calorie malnutrition: Secondary | ICD-10-CM | POA: Insufficient documentation

## 2016-12-04 DIAGNOSIS — Z888 Allergy status to other drugs, medicaments and biological substances status: Secondary | ICD-10-CM | POA: Insufficient documentation

## 2016-12-04 DIAGNOSIS — E785 Hyperlipidemia, unspecified: Secondary | ICD-10-CM | POA: Insufficient documentation

## 2016-12-04 DIAGNOSIS — Z794 Long term (current) use of insulin: Secondary | ICD-10-CM | POA: Insufficient documentation

## 2016-12-05 ENCOUNTER — Other Ambulatory Visit: Payer: Medicare HMO

## 2016-12-06 NOTE — Progress Notes (Signed)
[name], [name] ([id number]) Visit Report for 12/04/2016 Arrival Information Details Patient Name: [name], [name] Date of Service: 12/04/2016 10:15 AM Medical Record Number: [id number] Patient Account Number: [id number] Date of Birth/Sex: [birth year] (76 y.o. Female) Treating RN: Afful, RN, BSN, Velva Harman Primary Care Andras Grunewald: Beverlyn Roux Other Clinician: Referring Freddie Nghiem: Beverlyn Roux Treating Zayyan Mullen/Extender: Tito Dine in Treatment: 4 Visit Information History Since Last Visit All ordered tests and consults were completed: No Patient Arrived: Wheel Chair Added or deleted any medications: No Arrival Time: 10:06 Any new allergies or adverse reactions: No Accompanied By: hubby Had a fall or experienced change in No activities of daily living that may affect Transfer Assistance: Manual risk of falls: Patient Identification Verified: Yes Signs or symptoms of abuse/neglect since last No Secondary Verification Process Yes visito Completed: Hospitalized since last visit: No Patient Requires Transmission-Based No Has Dressing in Place as Prescribed: Yes Precautions: Pain Present Now: Yes Patient Has Alerts: No Electronic Signature(s) Signed: 12/04/2016 5:23:29 PM By: Regan Lemming BSN, RN Entered By: Regan Lemming on 12/04/2016 10:18:01 Mckellips, Emiah [name] ([id number]) -------------------------------------------------------------------------------- Encounter Discharge Information Details Patient Name: [name], [name] Date of Service: 12/04/2016 10:15 AM Medical Record Number: [id number] Patient Account Number: [id number] Date of Birth/Sex: 1941-02-24 (77 y.o. Female) Treating RN: Afful, RN, BSN, Velva Harman Primary Care Annett Boxwell: Beverlyn Roux Other Clinician: Referring Soul Hackman: Beverlyn Roux Treating Alexiya Franqui/Extender: Tito Dine in Treatment: 4 Encounter Discharge Information Items Discharge Pain Level: 0 Discharge Condition: Stable Ambulatory Status: Wheelchair Discharge  Destination: Home Transportation: Private Auto Accompanied By: Micheline Rough Schedule Follow-up Appointment: No Medication Reconciliation completed No and provided to Patient/Care Spence Soberano: Provided on Clinical Summary of Care: 12/04/2016 Form Type Recipient Paper Patient OD Electronic Signature(s) Signed: 12/04/2016 5:23:29 PM By: Regan Lemming BSN, RN Previous Signature: 12/04/2016 10:53:17 AM Version By: Ruthine Dose Entered By: Regan Lemming on 12/04/2016 10:56:14 [name], Dequita V. ([id number]) -------------------------------------------------------------------------------- Lower Extremity Assessment Details Patient Name: [name], [name] Date of Service: 12/04/2016 10:15 AM Medical Record Number: [id number] Patient Account Number: [id number] Date of Birth/Sex: April 06, 1941 (76 y.o. Female) Treating RN: Afful, RN, BSN, Administrator, sports Primary Care Marrissa Dai: Beverlyn Roux Other Clinician: Referring Juelle Dickmann: Beverlyn Roux Treating Tiwana Chavis/Extender: Tito Dine in Treatment: 4 Edema Assessment Assessed: [Left: No] [Right: No] Edema: [Left: Ye] [Right: s] Calf Left: Right: Point of Measurement: 34 cm From Medial Instep cm 44.7 cm Ankle Left: Right: Point of Measurement: 11 cm From Medial Instep cm 28.3 cm Vascular Assessment Claudication: Claudication Assessment [Right:None] Pulses: Dorsalis Pedis Doppler Audible: [Right:Yes] Posterior Tibial Extremity colors, hair growth, and conditions: Extremity Color: [Right:Mottled] Hair Growth on Extremity: [Right:Yes] Temperature of Extremity: [Right:Warm] Capillary Refill: [Right:< 3 seconds] Toe Nail Assessment Left: Right: Thick: Yes Discolored: Yes Deformed: No Improper Length and Hygiene: No Electronic Signature(s) Signed: 12/04/2016 5:23:29 PM By: Regan Lemming BSN, RN Entered By: Regan Lemming on 12/04/2016 10:33:13 Dhingra, Jazzmen V. ([id number]) Johns, Sharen V.  ([id number]) -------------------------------------------------------------------------------- Multi Wound Chart Details Patient Name: [name], [name] Date of Service: 12/04/2016 10:15 AM Medical Record Number: [id number] Patient Account Number: [id number] Date of Birth/Sex: Mar 26, 1941 (76 y.o. Female) Treating RN: Afful, RN, BSN, Velva Harman Primary Care Evonna Stoltz: Beverlyn Roux Other Clinician: Referring Matvey Llanas: Beverlyn Roux Treating Jewelianna Pancoast/Extender: Tito Dine in Treatment: 4 Vital Signs Height(in): 64 Pulse(bpm): 75 Weight(lbs): 200 Blood Pressure 134/56 (mmHg): Body Mass Index(BMI): 34 Temperature(F): 97.7 Respiratory Rate 18 (breaths/min): Photos: [5:No Photos] [6:No Photos] [7:No Photos] Wound Location: [5:Right Lower Leg - DistalRight Lower Leg -] [6:Proximal] [7:Right Metatarsal  head first - Dorsal] Wounding Event: [5:Gradually Appeared] [6:Gradually Appeared] [7:Gradually Appeared] 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] [6:the Lower Extremity] [7:Diabetic Wound/Ulcer of the Lower Extremity] Comorbid History: [5:Cataracts, Chronic sinus Cataracts, Chronic sinus problems/congestion, Congestive Heart Failure, Congestive Heart Failure, Hypertension, Type II Diabetes] [6:problems/congestion, Hypertension, Type II Diabetes] [7:Cataracts, Chronic  sinus problems/congestion, Congestive Heart Failure, Hypertension, Type II Diabetes] Date Acquired: [5:09/09/2016] [6:09/09/2016] [7:08/06/2013] Weeks of Treatment: [5:4] [6:4] [7:4] Wound Status: [5:Open] [6:Open] [7:Open] Measurements L x W x D 0.5x0.5x0.1 [6:1x0.5x0.1] [7:2.5x1x0.2] (cm) Area (cm) : [5:0.196] [6:0.393] [7:1.963] Volume (cm) : [5:0.02] [6:0.039] [7:0.393] % Reduction in Area: [5:88.90%] [6:-25.20%] [7:-66.60%] % Reduction in Volume: 88.70% [6:-25.80%] [7:-11.30%] Classification: [5:Grade 1] [6:Grade 2] [7:Grade 2] Exudate Amount: [5:Small] [6:Medium]  [7:Large] Exudate Type: [5:Serous] [6:Serous] [7:Serous] Exudate Color: [5:amber] [6:amber] [7:amber] Wound Margin: [5:Flat and Intact] [6:Flat and Intact] [7:Flat and Intact] Granulation Amount: [5:Large (67-100%)] [6:None Present (0%)] [7:None Present (0%)] Granulation Quality: [5:Pink, Friable] [6:N/A] [7:N/A] Necrotic Amount: [5:None Present (0%)] [6:Large (67-100%)] [7:Large (67-100%)] Exposed Structures: [5:Fat Layer (Subcutaneous Fat Layer (Subcutaneous Tissue) Exposed: Yes] [6:Tissue) Exposed: Yes] [7:Fat Layer (Subcutaneous Tissue) Exposed: Yes] Fascia: No Fascia: No Fascia: No Tendon: No Tendon: No Tendon: No Muscle: No Muscle: No Muscle: No Joint: No Joint: No Joint: No Bone: No Bone: No Bone: No Epithelialization: Large (67-100%) Medium (34-66%) None Debridement: N/A N/A Debridement ([id number]) Pre-procedure N/A N/A 10:34 Verification/Time Out Taken: Pain Control: N/A N/A Lidocaine 4% Topical Solution Tissue Debrided: N/A N/A Fibrin/Slough, Subcutaneous Level: N/A N/A Skin/Subcutaneous Tissue Debridement Area (sq N/A N/A 2.5 cm): Instrument: N/A N/A Curette Bleeding: N/A N/A Minimum Hemostasis Achieved: N/A N/A Pressure Procedural Pain: N/A N/A 0 Post Procedural Pain: N/A N/A 0 Debridement Treatment N/A N/A Procedure was tolerated Response: well Post Debridement N/A N/A 2.5x1x0.2 Measurements L x W x D (cm) Post Debridement N/A N/A 0.393 Volume: (cm) Periwound Skin Texture: Scarring: Yes Scarring: Yes Scarring: Yes Excoriation: No Excoriation: No Induration: No Induration: No Callus: No Callus: No Crepitus: No Crepitus: No Rash: No Rash: No Periwound Skin Maceration: No Maceration: No Maceration: Yes Moisture: Dry/Scaly: No Dry/Scaly: No Periwound Skin Color: Hemosiderin Staining: Yes Hemosiderin Staining: Yes Ecchymosis: Yes Atrophie Blanche: No Atrophie Blanche: No Hemosiderin Staining: Yes Cyanosis: No Cyanosis:  No Mottled: Yes Ecchymosis: No Ecchymosis: No Erythema: No Erythema: No Mottled: No Mottled: No Pallor: No Pallor: No Rubor: No Rubor: No Temperature: No Abnormality No Abnormality No Abnormality Tenderness on No Yes Yes Palpation: Radle, Jaycelyn V. 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 the Lower Extremity Comorbid History: Cataracts, Chronic sinus Cataracts, Chronic sinus N/A problems/congestion, problems/congestion, Congestive Heart Failure, Congestive Heart Failure, Hypertension, Type II Hypertension, Type II Diabetes Diabetes Date Acquired: 10/28/2016 10/14/2016 N/A Weeks of Treatment: 4 4 N/A Wound Status: Open Open N/A Measurements L x W x D 0.5x0.7x0.1 2.5x0.5x0.1 N/A (cm) Area (cm) : 0.275 0.982 N/A Volume (cm) : 0.027 0.098 N/A % Reduction in Area: 63.50% 0.00% N/A % Reduction in Volume: 64.00% 50.00% N/A Classification: Unable to visualize wound Grade 1  N/A bed Exudate Amount: Large Large N/A Exudate Type: Serous Serous N/A Exudate Color: amber amber N/A Wound Margin: Flat and Intact Indistinct, nonvisible N/A Granulation Amount: None Present (0%) None Present (0%) N/A Granulation Quality: N/A N/A N/A Necrotic Amount: Large (67-100%) Large (67-100%) N/A Exposed Structures: Fascia: No Fat Layer (Subcutaneous N/A Fat Layer (Subcutaneous Tissue) Exposed: Yes Tissue) Exposed:  No Fascia: No Tendon: No Tendon: No Muscle: No Muscle: No Joint: No Joint: No Bone: No Bone: No Limited to Skin Breakdown Epithelialization: None Small (1-33%) N/A Debridement: Debridement ([id number] N/A N/A [id number]) Helfrich, Brinn V. ([id number]) Pre-procedure 10:34 N/A N/A Verification/Time Out Taken: Pain Control: Lidocaine 4% Topical N/A N/A Solution Tissue Debrided: Fibrin/Slough, N/A N/A Subcutaneous Level: Skin/Subcutaneous N/A N/A Tissue Debridement Area (sq 0.35 N/A N/A cm): Instrument: Curette N/A N/A Bleeding: Minimum N/A N/A Hemostasis Achieved: Pressure N/A N/A Procedural Pain: 0 N/A N/A Post Procedural Pain: 0 N/A N/A Debridement Treatment Procedure was tolerated N/A N/A Response: well Post Debridement 0.5x0.7x0.2 N/A N/A Measurements L x W x D (cm) Post Debridement 0.055 N/A N/A Volume: (cm) Periwound Skin Texture: No Abnormalities Noted Excoriation: No N/A Induration: No Callus: No Crepitus: No Rash: No Scarring: No Periwound Skin Maceration: Yes Maceration: Yes N/A Moisture: Dry/Scaly: No Dry/Scaly: No Periwound Skin Color: No Abnormalities Noted Hemosiderin Staining: Yes N/A Atrophie Blanche: No Cyanosis: No Ecchymosis: No Erythema: No Mottled: No Pallor: No Rubor: No Temperature: No Abnormality No Abnormality N/A Tenderness on Yes Yes N/A Palpation: Wound Preparation: Ulcer Cleansing: Ulcer Cleansing: N/A Rinsed/Irrigated with Rinsed/Irrigated with Saline Saline Topical Anesthetic Topical Anesthetic Applied: Other: lidocaine Applied: Other: lidocaine 4% 4% Procedures Performed: Debridement N/A N/A NAEVIA, UNTERREINER ([id number]) Treatment Notes Electronic Signature(s) Signed: 12/04/2016 5:23:29 PM By: Regan Lemming BSN, RN Entered By: Regan Lemming on 12/04/2016 10:54:53 Polidore, Mithra [name] ([id number]) -------------------------------------------------------------------------------- [place] Details Patient Name: Barocio,  Ellora V. Date of Service: 12/04/2016 10:15 AM Medical Record Number: [id number] Patient Account Number: [id number] Date of Birth/Sex: Aug 18, 1940 (76 y.o. Female) Treating RN: Afful, RN, BSN, Velva Harman Primary Care Tywan Siever: Beverlyn Roux Other Clinician: Referring Joshia Kitchings: Beverlyn Roux Treating Brycen Bean/Extender: Tito Dine in Treatment: 4 Active Inactive ` Abuse / Safety / Falls / Self Care Management Nursing Diagnoses: Impaired physical mobility Potential for falls Goals: Patient will remain injury free Date Initiated: 11/06/2016 Target Resolution Date: 12/30/2016 Goal Status: Active Patient/caregiver will verbalize understanding of skin care regimen Date Initiated: 11/06/2016 Target Resolution Date: 12/30/2016 Goal Status: Active Interventions: Assess fall risk on admission and as needed Treatment Activities: Patient referred to home care : 11/06/2016 Notes: ` Nutrition Nursing Diagnoses: Potential for alteratiion in Nutrition/Potential for imbalanced nutrition Goals: Patient/caregiver verbalizes understanding of need to maintain therapeutic glucose control per primary care physician Date Initiated: 11/06/2016 Target Resolution Date: 12/30/2016 Goal Status: Active Interventions: Provide education on elevated blood sugars and impact on wound healing Kanode, Zemira V. ([id number]) Notes: ` Orientation to the Wound Care Program Nursing Diagnoses: Knowledge deficit related to the wound healing center program Goals: Patient/caregiver will verbalize understanding of the [facility] Date Initiated: 11/06/2016 Target Resolution Date: 12/30/2016 Goal Status: Active Interventions: Provide education on orientation to the wound center Notes: ` Venous Leg Ulcer Nursing Diagnoses: Actual venous Insuffiency (use after diagnosis is confirmed) Knowledge deficit related to disease process and management Goals: Non-invasive venous studies are completed as  ordered Date Initiated: 11/06/2016 Target Resolution Date: 12/30/2016 Goal Status: Active Patient/caregiver will verbalize understanding of disease process and disease management Date Initiated: 11/06/2016  Target Resolution Date: 12/30/2016 Goal Status: Active Interventions: Assess peripheral edema status every visit. Notes: ` Wound/Skin Impairment Nursing Diagnoses: Impaired tissue integrity Knowledge deficit related to smoking impact on wound healing Knowledge deficit related to ulceration/compromised skin integrity Goals: Ammar, Horace V. ([id number]) Ulcer/skin breakdown will heal within 14 weeks Date Initiated: 11/06/2016 Target Resolution Date: 02/05/2017 Goal Status: Active Interventions: Assess ulceration(s) every visit Treatment Activities: Skin care regimen initiated : 11/06/2016 Notes: Electronic Signature(s) Signed: 12/04/2016 5:23:29 PM By: Regan Lemming BSN, RN Entered By: Regan Lemming on 12/04/2016 10:54:38 Huston, Jawana V. ([id number]) -------------------------------------------------------------------------------- Pain Assessment Details Patient Name: Palencia, [place] Date of Service: 12/04/2016 10:15 AM Medical Record Number: [id number] Patient Account Number: [id number] Date of Birth/Sex: [birth year] (76 y.o. Female) Treating RN: Afful, RN, BSN, Velva Harman Primary Care Shahrzad Koble: Beverlyn Roux Other Clinician: Referring Kaushik Maul: Beverlyn Roux Treating Iliya Spivack/Extender: Tito Dine in Treatment: 4 Active Problems Location of Pain Severity and Description of Pain Patient Has Paino Yes Site Locations Pain Location: Pain in Ulcers Rate the pain. Current Pain Level: 2 Character of Pain Describe the Pain: Tender Pain Management and Medication Current Pain Management: Electronic Signature(s) Signed: 12/04/2016 5:23:29 PM By: Regan Lemming BSN, RN Entered By: Regan Lemming on 12/04/2016 10:18:12 Fayson, Levette [name]  ([id number]) -------------------------------------------------------------------------------- Patient/Caregiver Education Details Patient Name: Choate, Latrisha V. Date of Service: 12/04/2016 10:15 AM Medical Record Patient Account Number: [id number] Number: Treating RN: Baruch Gouty, RN, BSN, Velva Harman Dec 20, 1940 [id number] y.o. Other Clinician: Date of Birth/Gender: Female) Treating ROBSON, [place] Primary Care Physician: Beverlyn Roux Physician/Extender: G Referring Physician: Dorthula Nettles in Treatment: 4 Education Assessment Education Provided To: Patient and Caregiver hubby Education Topics Provided Elevated Blood Sugar/ Impact on Healing: Methods: Explain/Verbal Responses: State content correctly Welcome To [facility] [place]: Methods: Explain/Verbal Responses: State content correctly Wound Debridement: Methods: Explain/Verbal Responses: State content correctly Wound/Skin Impairment: Methods: Explain/Verbal Responses: State content correctly Electronic Signature(s) Signed: 12/04/2016 5:23:29 PM By: Regan Lemming BSN, RN Entered By: Regan Lemming on 12/04/2016 10:56:41 Sellen, Jama V. ([id number]) -------------------------------------------------------------------------------- Wound Assessment Details Patient Name: Balestrieri, Sarai V. Date of Service: 12/04/2016 10:15 AM Medical Record Number: [id number] Patient Account Number: [id number] Date of Birth/Sex: Mar 27, 1941 (76 y.o. Female) Treating RN: Afful, RN, BSN, Administrator, sports Primary Care Cheick Suhr: Beverlyn Roux Other Clinician: Referring Michale Emmerich: Beverlyn Roux Treating Arnecia Ector/Extender: Tito Dine in Treatment: 4 Wound Status Wound Number: 5 Primary Diabetic Wound/Ulcer of the Lower Etiology: Extremity Wound Location: Right Lower Leg - Distal Wound Open Wounding Event: Gradually Appeared Status: Date Acquired: 09/09/2016 Comorbid Cataracts, Chronic sinus Weeks Of Treatment: 4 History: problems/congestion, Congestive  Heart Clustered Wound: No Failure, Hypertension, Type II Diabetes Photos Photo Uploaded By: Regan Lemming on 12/04/2016 17:15:28 Wound Measurements Length: (cm) 0.5 Width: (cm) 0.5 Depth: (cm) 0.1 Area: (cm) 0.196 Volume: (cm) 0.02 % Reduction in Area: 88.9% % Reduction in Volume: 88.7% Epithelialization: Large (67-100%) Tunneling: No Undermining: No Wound Description Classification: Grade 1 Foul Odor Aft Wound Margin: Flat and Intact Slough/Fibrin Exudate Amount: Small Exudate Type: Serous Vanecek, Aireonna V. ([id number]) er Cleansing: No o No Exudate Color: amber Wound Bed Granulation Amount: Large (67-100%) Exposed Structure Granulation Quality: Pink, Friable Fascia Exposed: No Necrotic Amount: None Present (0%) Fat Layer (Subcutaneous Tissue) Exposed: Yes Tendon Exposed: No Muscle Exposed: No Joint Exposed: No Bone Exposed: No Periwound Skin Texture Texture Color No Abnormalities Noted: No No Abnormalities Noted: No Callus: No Atrophie Blanche: No Crepitus: No Cyanosis: No Excoriation: No Ecchymosis: No Induration: No Erythema: No Rash: No  Hemosiderin Staining: Yes Scarring: Yes Mottled: No Pallor: No Moisture Rubor: No No Abnormalities Noted: No Dry / Scaly: No Temperature / Pain Maceration: No Temperature: No Abnormality Wound Preparation Ulcer Cleansing: Rinsed/Irrigated with Saline Topical Anesthetic Applied: None Treatment Notes Wound #5 (Right, Distal Lower Leg) 1. Cleansed with: Cleanse wound with antibacterial soap and water 3. Peri-wound Care: Moisturizing lotion 4. Dressing Applied: Prisma Ag 5. Secondary Dressing Applied Dry Gauze Kerlix/Conform 7. Secured with Recruitment consultant) Signed: 12/04/2016 5:23:29 PM By: Regan Lemming BSN, RN Entered By: Regan Lemming on 12/04/2016 10:34:37 Gradilla, ANGELIN CUTRONE ([id number]) Diekmann, Hanae VMarland Kitchen  ([id number]) -------------------------------------------------------------------------------- Wound Assessment Details Patient Name: Chamberland, Amayra V. Date of Service: 12/04/2016 10:15 AM Medical Record Number: [id number] Patient Account Number: [id number] Date of Birth/Sex: [birth year] (76 y.o. Female) Treating RN: Afful, RN, BSN, Administrator, sports Primary Care Amilia Vandenbrink: Beverlyn Roux Other Clinician: Referring Sabrinna Yearwood: Beverlyn Roux Treating Nonna Renninger/Extender: Tito Dine in Treatment: 4 Wound Status Wound Number: 6 Primary Diabetic Wound/Ulcer of the Lower Etiology: Extremity Wound Location: Right Lower Leg - Proximal Wound Open Wounding Event: Gradually Appeared Status: Date Acquired: 09/09/2016 Comorbid Cataracts, Chronic sinus Weeks Of Treatment: 4 History: problems/congestion, Congestive Heart Clustered Wound: No Failure, Hypertension, Type II Diabetes Photos Photo Uploaded By: Regan Lemming on 12/04/2016 17:12:01 Wound Measurements Length: (cm) 1 Width: (cm) 0.5 Depth: (cm) 0.1 Area: (cm) 0.393 Volume: (cm) 0.039 % Reduction in Area: -25.2% % Reduction in Volume: -25.8% Epithelialization: Medium (34-66%) Tunneling: No Undermining: No Wound Description Classification: Grade 2 Wound Margin: Flat and Intact Exudate Amount: Medium Exudate Type: Serous Windt, Naz V. ([id number]) Foul Odor After Cleansing: No Slough/Fibrino Yes Exudate Color: amber Wound Bed Granulation Amount: None Present (0%) Exposed Structure Necrotic Amount: Large (67-100%) Fascia Exposed: No Necrotic Quality: Adherent Slough Fat Layer (Subcutaneous Tissue) Exposed: Yes Tendon Exposed: No Muscle Exposed: No Joint Exposed: No Bone Exposed: No Periwound Skin Texture Texture Color No Abnormalities Noted: No No Abnormalities Noted: No Callus: No Atrophie Blanche: No Crepitus: No Cyanosis: No Excoriation: No Ecchymosis: No Induration: No Erythema: No Rash: No Hemosiderin Staining:  Yes Scarring: Yes Mottled: No Pallor: No Moisture Rubor: No No Abnormalities Noted: No Dry / Scaly: No Temperature / Pain Maceration: No Temperature: No Abnormality Tenderness on Palpation: Yes Wound Preparation Ulcer Cleansing: Rinsed/Irrigated with Saline Topical Anesthetic Applied: Other: lidocaine 4%, Treatment Notes Wound #6 (Right, Proximal Lower Leg) 1. Cleansed with: Cleanse wound with antibacterial soap and water 3. Peri-wound Care: Moisturizing lotion 4. Dressing Applied: Prisma Ag 5. Secondary Dressing Applied Dry Gauze Kerlix/Conform 7. Secured with Recruitment consultant) Signed: 12/04/2016 5:23:29 PM By: Regan Lemming BSN, RN Gilden, Terrianna V. ([id number]) Entered By: Regan Lemming on 12/04/2016 10:52:39 Racz, Annabell [name] ([id number]) -------------------------------------------------------------------------------- Wound Assessment Details Patient Name: Tata, Makilah V. Date of Service: 12/04/2016 10:15 AM Medical Record Number: [id number] Patient Account Number: [id number] Date of Birth/Sex: 1941-03-24 (76 y.o. Female) Treating RN: Afful, RN, BSN, Velva Harman Primary Care Rande Dario: Beverlyn Roux Other Clinician: Referring Inita Uram: Beverlyn Roux Treating Diondre Pulis/Extender: Tito Dine in Treatment: 4 Wound Status Wound Number: 7 Primary Diabetic Wound/Ulcer of the Lower Etiology: Extremity Wound Location: Right Metatarsal head first - Dorsal Wound Open Status: Wounding Event: Gradually Appeared Comorbid Cataracts, Chronic sinus Date Acquired: 08/06/2013 History: problems/congestion, Congestive Heart Weeks Of Treatment: 4 Failure, Hypertension, Type II Diabetes Clustered Wound: No Photos Photo Uploaded By: Regan Lemming on 12/04/2016 17:14:05 Wound Measurements Length: (cm) 2.5 Width: (cm) 1 Depth: (cm) 0.2 Area: (cm) 1.963 Volume: (cm) 0.393 % Reduction  in Area: -66.6% % Reduction in Volume: -11.3% Epithelialization: None Tunneling:  No Undermining: No Wound Description Classification: Grade 2 Wound Margin: Flat and Intact Exudate Amount: Large Exudate Type: Serous Exudate Color: amber Foul Odor After Cleansing: No Slough/Fibrino Yes Wound Bed Granulation Amount: None Present (0%) Exposed Structure Necrotic Amount: Large (67-100%) Fascia Exposed: No Necrotic Quality: Adherent Slough Fat Layer (Subcutaneous Tissue) Exposed: Yes [place], Lexianna V. ([id number]) Tendon Exposed: No Muscle Exposed: No Joint Exposed: No Bone Exposed: No Periwound Skin Texture Texture Color No Abnormalities Noted: No No Abnormalities Noted: No Scarring: Yes Ecchymosis: Yes Hemosiderin Staining: Yes Moisture Mottled: Yes No Abnormalities Noted: No Maceration: Yes Temperature / Pain Temperature: No Abnormality Tenderness on Palpation: Yes Wound Preparation Ulcer Cleansing: Rinsed/Irrigated with Saline Topical Anesthetic Applied: Other: lidociane 4%, Treatment Notes Wound #7 (Right, Dorsal Metatarsal head first) 1. Cleansed with: Cleanse wound with antibacterial soap and water 3. Peri-wound Care: Moisturizing lotion 4. Dressing Applied: Prisma Ag 5. Secondary Dressing Applied Dry Gauze Kerlix/Conform 7. Secured with Recruitment consultant) Signed: 12/04/2016 5:23:29 PM By: Regan Lemming BSN, RN Entered By: Regan Lemming on 12/04/2016 10:53:05 Keener, Brendy [name] ([id number]) -------------------------------------------------------------------------------- Wound Assessment Details Patient Name: Laramie, Kindsey V. Date of Service: 12/04/2016 10:15 AM Medical Record Number: [id number] Patient Account Number: [id number] Date of Birth/Sex: 02-10-1941 (75 y.o. 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 ([id number]) Foul Odor After Cleansing: No Slough/Fibrino Yes Exudate Color: amber Wound Bed Granulation Amount: None Present (0%) Exposed Structure Necrotic Amount: Large (67-100%) Fascia Exposed: No Necrotic Quality: Adherent Slough Fat Layer (Subcutaneous Tissue) Exposed: No Tendon Exposed: No Muscle Exposed: No Joint Exposed: No Bone Exposed: No Limited to Skin Breakdown Periwound Skin Texture Texture Color No Abnormalities Noted: No No Abnormalities Noted: No Moisture Temperature / Pain No Abnormalities Noted: No Temperature: No Abnormality Dry / Scaly: No Tenderness on Palpation: Yes Maceration: Yes Wound Preparation Ulcer Cleansing: Rinsed/Irrigated with Saline Topical Anesthetic Applied: Other: lidocaine 4%, Treatment Notes Wound #8 (Right, Dorsal Toe Third) 1. Cleansed with: Cleanse wound with antibacterial soap and water 3. Peri-wound Care: Moisturizing lotion 4. Dressing Applied: Prisma Ag 5. Secondary Dressing Applied Dry Gauze Kerlix/Conform 7. Secured with Recruitment consultant) Signed: 12/04/2016 5:23:29 PM By: Regan Lemming BSN, RN Entered By: Regan Lemming on 12/04/2016 10:53:32 Donaho, Madalyn [name]  ([id number]) -------------------------------------------------------------------------------- Wound Assessment Details Patient Name: Dwyer, Marimar V. Date of Service: 12/04/2016 10:15 AM Medical Record Number: [id number] Patient Account Number: [id number] Date of Birth/Sex: February 25, 1941 (76 y.o. 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 ([id number]) Foul Odor After Cleansing: No Slough/Fibrino Yes Exudate Color: amber Wound Bed Granulation Amount: None Present (0%) Exposed Structure Necrotic Amount: Large (67-100%) Fascia Exposed: No Necrotic Quality: Adherent Slough Fat Layer (Subcutaneous Tissue) Exposed: Yes Tendon Exposed: No Muscle Exposed: No Joint Exposed: No Bone Exposed: No Periwound Skin Texture Texture Color No Abnormalities Noted: No No Abnormalities Noted: No Callus: No Atrophie Blanche: No Crepitus: No Cyanosis: No Excoriation: No Ecchymosis: No Induration: No Erythema: No Rash: No Hemosiderin Staining:  Yes Scarring: No Mottled: No Pallor: No Moisture Rubor: No No Abnormalities Noted: No Dry / Scaly: No Temperature / Pain Maceration: Yes Temperature: No Abnormality Tenderness on Palpation: Yes Wound Preparation Ulcer Cleansing: Rinsed/Irrigated with Saline Topical Anesthetic Applied: Other: lidocaine 4%, Treatment Notes Wound #9 (Right, Distal, Lateral Foot) 1. Cleansed with: Cleanse wound with antibacterial soap and water 3. Peri-wound Care: Moisturizing lotion 4. Dressing Applied: Prisma Ag 5. Secondary Dressing Applied Dry Gauze Kerlix/Conform 7. Secured with Recruitment consultant) Signed: 12/04/2016 5:23:29 PM By: Regan Lemming BSN, RN Garay, Crestina V. ([id number]) Entered By: Regan Lemming on 12/04/2016 10:54:27 Marcella, Spring [name] ([id number]) -------------------------------------------------------------------------------- Vitals Details Patient Name: Fristoe, Alleta V. Date of Service: 12/04/2016 10:15 AM Medical Record Number: [id number] Patient Account Number: [id number] Date of Birth/Sex: November 09, 1940 (76 y.o. Female) Treating RN: Afful, RN, BSN, Velva Harman Primary Care Grove Defina: Beverlyn Roux Other Clinician: Referring Toniesha Zellner: Beverlyn Roux Treating Estiben Mizuno/Extender: Tito Dine in Treatment: 4 Vital Signs Time Taken: 10:14 Temperature (F): 97.7 Height (in): 64 Pulse (bpm): 75 Weight (lbs): 200 Respiratory Rate (breaths/min): 18 Body Mass Index (BMI): 34.3 Blood Pressure (mmHg): 134/56 Reference Range: 80 - 120 mg / dl Electronic Signature(s) Signed: 12/04/2016 5:23:29 PM By: Regan Lemming BSN, RN Entered By: Regan Lemming on 12/04/2016 10:18:33

## 2016-12-06 NOTE — Progress Notes (Addendum)
[name], [name] ([id number]) Visit Report for 12/04/2016 Chief Complaint Document Details Patient Name: [name], [name] Date of Service: 12/04/2016 10:15 AM Medical Record Patient Account Number: [id number] Number: Treating RN: Baruch Gouty, RN, BSN, Velva Harman [birth year] [phone] y.o. Other Clinician: Date of Birth/Sex: Female) Treating Sonnie Pawloski Primary Care Provider: Beverlyn Roux Provider/Extender: G Referring Provider: Dorthula Nettles in Treatment: 4 Information Obtained from: Patient Chief Complaint The patient is here for initial evaluation of RLE and Right foot/toe ulcers Electronic Signature(s) Signed: 12/04/2016 5:52:53 PM By: Linton Ham MD Entered By: Linton Ham on 12/04/2016 10:41:31 Stiverson, Danelia V. ([id number]) -------------------------------------------------------------------------------- Debridement Details Patient Name: [name], [name] Date of Service: 12/04/2016 10:15 AM Medical Record Patient Account Number: [id number] Number: Treating RN: Baruch Gouty, RN, BSN, Velva Harman Apr 01, 1941 [phone] y.o. Other Clinician: Date of Birth/Sex: Female) Treating Yasmene Salomone, [place] Primary Care Provider: Beverlyn Roux Provider/Extender: G Referring Provider: Dorthula Nettles in Treatment: 4 Debridement Performed for Wound #7 Right,Dorsal Metatarsal head first Assessment: Performed By: Physician Ricard Dillon, MD Debridement: Debridement Pre-procedure Yes - 10:34 Verification/Time Out Taken: Start Time: 10:34 Pain Control: Lidocaine 4% Topical Solution Level: Skin/Subcutaneous Tissue Total Area Debrided (L x 2.5 (cm) x 1 (cm) = 2.5 (cm) W): Tissue and other Non-Viable, Fibrin/Slough, Subcutaneous material debrided: Instrument: Curette Bleeding: Minimum Hemostasis Achieved: Pressure End Time: 10:38 Procedural Pain: 0 Post Procedural Pain: 0 Response to Treatment: Procedure was tolerated well Post Debridement Measurements of Total Wound Length: (cm) 2.5 Width:  (cm) 1 Depth: (cm) 0.2 Volume: (cm) 0.393 Character of Wound/Ulcer Post Requires Further Debridement Debridement: Severity of Tissue Post Debridement: Fat layer exposed Post Procedure Diagnosis Same as Pre-procedure Electronic Signature(s) Signed: 12/04/2016 5:23:29 PM By: Regan Lemming BSN, RN Signed: 12/04/2016 5:52:53 PM By: Linton Ham MD Stemler, [name] ([id number]) Entered By: Linton Ham on 12/04/2016 10:41:14 Mclamb, Railee V. ([id number]) -------------------------------------------------------------------------------- Debridement Details Patient Name: [name], [name] Date of Service: 12/04/2016 10:15 AM Medical Record Patient Account Number: [id number] Number: Treating RN: Baruch Gouty, RN, BSN, Velva Harman 11/24/1940 [phone] y.o. Other Clinician: Date of Birth/Sex: Female) Treating Kyliyah Stirn Primary Care Provider: Beverlyn Roux Provider/Extender: G Referring Provider: Dorthula Nettles in Treatment: 4 Debridement Performed for Wound #8 Right,Dorsal Toe Third Assessment: Performed By: Physician Ricard Dillon, MD Debridement: Debridement Pre-procedure Yes - 10:34 Verification/Time Out Taken: Start Time: 10:34 Pain Control: Lidocaine 4% Topical Solution Level: Skin/Subcutaneous Tissue Total Area Debrided (L x 0.5 (cm) x 0.7 (cm) = 0.35 (cm) W): Tissue and other Non-Viable, Fibrin/Slough, Subcutaneous material debrided: Instrument: Curette Bleeding: Minimum Hemostasis Achieved: Pressure End Time: 10:38 Procedural Pain: 0 Post Procedural Pain: 0 Response to Treatment: Procedure was tolerated well Post Debridement Measurements of Total Wound Length: (cm) 0.5 Width: (cm) 0.7 Depth: (cm) 0.2 Volume: (cm) 0.055 Character of Wound/Ulcer Post Requires Further Debridement Debridement: Severity of Tissue Post Debridement: Fat layer exposed Post Procedure Diagnosis Same as Pre-procedure Electronic Signature(s) Signed: 12/04/2016 5:23:29 PM By: Regan Lemming  BSN, RN Signed: 12/04/2016 5:52:53 PM By: Linton Ham MD [name], [name] ([id number]) Entered By: Linton Ham on 12/04/2016 10:41:22 [name], [name] ([id number]) -------------------------------------------------------------------------------- HPI Details Patient Name: [name], [name] Date of Service: 12/04/2016 10:15 AM Medical Record Patient Account Number: [id number] Number: Treating RN: Baruch Gouty, RN, BSN, Velva Harman 1941/05/16 [phone] y.o. Other Clinician: Date of Birth/Sex: Female) Treating Lovelyn Sheeran Primary Care Provider: Beverlyn Roux Provider/Extender: G Referring Provider: Dorthula Nettles in Treatment: 4 History of Present Illness HPI Description: 08/13/16: This is a 76 year old woman who came  predominantly for review of 3 cm in diameter circular wound to the left anterior lateral leg. She was in the ER on 08/01/16 I reviewed their notes. There was apparently pus coming out of the wound at that time and the patient arrived requesting debridement which they don't do in the emergency room. Nevertheless I can't see that they did any x-rays. There were no cultures done. She is a type II diabetic and I a note after the patient was in the clinic that she had a bypass graft from the popliteal to the tibial on the right on 02/28/16. She also had a right greater saphenous vein harvest on the same date for arterial bypass. She is going to have vascular studies including ABIs T ABIs on the right on 08/28/16. The patient's surgery was on 02/28/16 by Dr. Vallarie Mare she had a right below the knee popliteal artery to peroneal artery bypass with reverse greater saphenous vein and an endarterectomy of the mid segment peroneal artery. Postoperatively she had a strong mild monophasic peroneal signal with a pink foot. It would appear that the patient is had some nonhealing in the surgical saphenous vein harvest site on the left leg. Surprisingly [facility] I cannot see much  information about this at all. Dr. Lucious Groves notes from 05/29/16 show that the patient's wounds "are not healed" the right first metatarsal wound healed but then opened back up. 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 At that point the patient had a wound VAC to her right calf with regards to the wounds on her dorsal right toe would appear that these are felt to be arterial wounds. She has had surgery on the metatarsal phalangeal in 2015 I Dr. Doran Durand secondary to a right metatarsal phalangeal joint fracture. She is apparently had discoloration around this area since then. 08/28/16; patient arrives with her wounds in much the same condition. The linear vein harvest site and the circular wound below it which I think was a blister. She also has to probing holes in her right great toe and a necrotic eschar on the right second toe. Because of these being arterial wounds I reduced her compression from 3-2 layers this seems to of done satisfactorily she has not had any problems. I cannot see that she is actually had an x-ray ====== 11/06/16 the patient comes in for evaluation of her right lower extremity ulcers. She was here in January 2018 for 2 visits subsequently ended up in the hospital with pneumonia and then to rehabilitation. She has now been discharged from rehabilitation and is home. She has multiple ulcerations to her right lower extremity including the foot and toes. She does have home health in place and they have been placing alginate to the ulcers. She is followed by Dr. Bridgett Larsson of vascular medicine. 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 She recently saw him on 3/23. In office ABIs were: Gracey, HAIDEN CLUCAS. ([id number]) Right 0.48 with monophasic flow to the DP, PT, peroneal Left 0.63 with monophasic flow to the DP and  PT Her arterial studies indicated a patent right below knee popliteal to peroneal bypass She had an MRI in February 2008 that was negative frosty myelitis but this showed general soft tissue edema in the right foot and lower extremity concerning for cellulitis She is a diabetic, managed with insulin.  Her hemoglobin A1c in December 2017 was 8.4 which is a trend up from previous levels. She had blood work in February 2018 which revealed an albumin of 2.6 this appears to be relatively acute as an albumin in November 2017 was 3.7 11/13/16; this is a patient I have not seen since February who is readmitted to our clinic last week. 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 Bridgett Larsson. I have reviewed Dr. Lianne Moris notes from March/23/18. Doppler ABI on that date showed an ABI on the right of 0.48 and on the left of 0.63. Dorsalis pedis waveforms were monophasic bilaterally. There was no waveforms detected at the posterior tibial on the right, monophasic on the left. Dr. Lianne Moris comments were that this patient would have follow-up vascular studies in 3 months including ABIs and right lower extremity arterial duplex. She had an MRI in February that was negative for osteomyelitis but showed generalized edema in the foot. Last albumin I see was in January at 3.4 we have been using Santyl to the 4 wounds on the right leg the patient is noted today to have widespread edema well up towards her groin this is pitting 2-3+. 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 Normal ejection fraction. 11/20/16; patient has a follow-up appointment with Dr. Bridgett Larsson on April 23. She is still complaining of a lot of pain in the right foot and right leg. It is not clear to me that this is at all positional however I think it is clear claudication with minimal activity perhaps at rest. At our suggestion she is  return to her primary physician's office tomorrow with regards to her pitting lower extremity bilateral edema that I reviewed in detail last week 11/27/16; the follow-up with Dr. Bridgett Larsson was actually on May 23 on April 23 as I stated in my note last week. N/A case all of her wounds seems somewhat smaller. The 2 on the right leg are definitely smaller. The areas on the dorsal right first toe, right third toe and the lateral part of the right fifth metatarsal head all looks smaller but have tightly adherent surfaces. We have been using Santyl 12/04/16; follow-up with Dr. Bridgett Larsson on May 23. 2 small open wounds on the right leg continue to get smaller. The area on the right third total lateral aspect of the right fifth toe also look better. The remaining area on the dorsal first toe still has some depth to it. We have been using Santyl to the toes and collagen on the right Electronic Signature(s) Signed: 12/04/2016 5:52:53 PM By: Linton Ham MD Entered By: Linton Ham on 12/04/2016 10:43:34 Quintela, Glennys [name] ([id number]) -------------------------------------------------------------------------------- Physical Exam Details Patient Name: [name], [name] Date of Service: 12/04/2016 10:15 AM Medical Record Patient Account Number: [id number] Number: Treating RN: Baruch Gouty, RN, BSN, Velva Harman 12-12-1940 [phone] y.o. Other Clinician: Date of Birth/Sex: Female) Treating Aunika Kirsten Primary Care Provider: Beverlyn Roux Provider/Extender: G Referring Provider: Dorthula Nettles in Treatment: 4 Constitutional Sitting or standing Blood Pressure is within target range for patient.. Pulse regular and within target range for patient.[name] Respirations regular, non-labored and within target range.. Temperature is normal and within the target range for the patient.. Patient's appearance is neat and clean. Appears in no acute distress. Well nourished and well developed.. Notes Wound exam; 2 small open wounds on the  right leg, one in the vein harvest  site and one below it both looks smaller. She has 3 wounds on the right foot including the base of the right first toe which was debrided with a #3 curet of surface slough. Base of the third toe also debrided of necrotic surface material. Lateral aspect of the right fifth toe did not require debridement. All of these looks somewhat better to me Electronic Signature(s) Signed: 12/04/2016 5:52:53 PM By: Linton Ham MD Entered By: Linton Ham on 12/04/2016 10:44:35 [place], [place] [name] ([id number]) -------------------------------------------------------------------------------- Physician Orders Details Patient Name: [name], [name] Date of Service: 12/04/2016 10:15 AM Medical Record Patient Account Number: [id number] Number: Treating RN: Baruch Gouty, RN, BSN, Velva Harman Feb 21, 1941 [phone] y.o. Other Clinician: Date of Birth/Sex: Female) Treating Shonya Sumida Primary Care Provider: Beverlyn Roux Provider/Extender: G Referring Provider: Dorthula Nettles in Treatment: 4 Verbal / Phone Orders: No Diagnosis Coding Wound Cleansing Wound #5 Right,Distal Lower Leg o Clean wound with Normal Saline. o Cleanse wound with mild soap and water Wound #6 Right,Proximal Lower Leg o Clean wound with Normal Saline. o Cleanse wound with mild soap and water Wound #7 Right,Dorsal Metatarsal head first o Clean wound with Normal Saline. o Cleanse wound with mild soap and water Wound #8 Right,Dorsal Toe Third o Clean wound with Normal Saline. o Cleanse wound with mild soap and water Wound #9 Right,Distal,Lateral Foot o Clean wound with Normal Saline. o Cleanse wound with mild soap and water Anesthetic Wound #5 Right,Distal Lower Leg o Topical Lidocaine 4% cream applied to wound bed prior to debridement Wound #6 Right,Proximal Lower Leg o Topical Lidocaine 4% cream applied to wound bed prior to debridement Wound #7 Right,Dorsal Metatarsal head  first o Topical Lidocaine 4% cream applied to wound bed prior to debridement Wound #8 Right,Dorsal Toe Third o Topical Lidocaine 4% cream applied to wound bed prior to debridement Wound #9 Right,Distal,Lateral Foot o Topical Lidocaine 4% cream applied to wound bed prior to debridement Elman, Tiani V. ([id number]) Primary Wound Dressing Wound #5 Right,Distal Lower Leg o Prisma Ag Wound #6 Right,Proximal Lower Leg o Prisma Ag Wound #7 Right,Dorsal Metatarsal head first o Prisma Ag Wound #8 Right,Dorsal Toe Third o Prisma Ag Wound #9 Right,Distal,Lateral Foot o Prisma Ag Secondary Dressing Wound #5 Right,Distal Lower Leg o Conform/Kerlix Wound #6 Right,Proximal Lower Leg o Conform/Kerlix Wound #7 Right,Dorsal Metatarsal head first o Dry Gauze o Conform/Kerlix Wound #8 Right,Dorsal Toe Third o Dry Gauze o Conform/Kerlix Wound #9 Right,Distal,Lateral Foot o Dry Gauze o Conform/Kerlix Dressing Change Frequency Wound #5 Right,Distal Lower Leg o Three times weekly Wound #6 Right,Proximal Lower Leg o Three times weekly Wound #7 Right,Dorsal Metatarsal head first o Change dressing every day. Wound #8 Right,Dorsal Toe Third o Change dressing every day. [name], [name] ([id number]) Wound #9 Right,Distal,Lateral Foot o Change dressing every day. Follow-up Appointments Wound #5 Right,Distal Lower Leg o Return Appointment in 1 week. Wound #6 Right,Proximal Lower Leg o Return Appointment in 1 week. Wound #7 Right,Dorsal Metatarsal head first o Return Appointment in 1 week. Wound #8 Right,Dorsal Toe Third o Return Appointment in 1 week. Wound #9 Right,Distal,Lateral Foot o Return Appointment in 1 week. Edema Control Wound #5 Right,Distal Lower Leg o Elevate legs to the level of the heart and pump ankles as often as possible Wound #6 Right,Proximal Lower Leg o Elevate legs to the level of the heart and pump ankles as often  as possible Wound #7 Right,Dorsal Metatarsal head first o Elevate legs to the level of the heart and pump ankles  as often as possible Wound #8 Right,Dorsal Toe Third o Elevate legs to the level of the heart and pump ankles as often as possible Wound #9 Right,Distal,Lateral Foot o Elevate legs to the level of the heart and pump ankles as often as possible Home Health Wound #5 [place] [facility]its - [facility] twice weekly, [facility]-Wednesday, teach family member to change on off day. o Home Health Nurse may visit PRN to address patientos wound care needs. o FACE TO FACE ENCOUNTER: MEDICARE and MEDICAID PATIENTS: I certify that this patient is under my care and that I had a face-to-face encounter that meets the physician face-to-face encounter requirements with this patient on this date. The encounter with the patient was in whole or in part for the following MEDICAL CONDITION: (primary reason for [place]) MEDICAL NECESSITY: I certify, that based on my findings, NURSING services are a medically necessary home health service. HOME BOUND STATUS: I certify that my clinical findings support that this patient is homebound (i.e., Due to illness or injury, pt requires aid of [place], Nareh V. ([id number]) supportive devices such as crutches, cane, wheelchairs, walkers, the use of special transportation or the assistance of another person to leave their place of residence. There is a normal inability to leave the home and doing so requires considerable and taxing effort. Other absences are for medical reasons / religious services and are infrequent or of short duration when for other reasons). o If current dressing causes regression in wound condition, may D/C ordered dressing product/s and apply Normal Saline Moist Dressing daily until next [place] / Other MD appointment. [place] of regression in wound  condition at [phone]. o Please direct any NON-WOUND related issues/requests for orders to patient's Primary Care Physician Wound #6 Right,Proximal Lower Leg o [place] Visits - [facility] twice weekly, [facility] Care Center-Wednesday, teach family member to change on off day. o Home Health Nurse may visit PRN to address patientos wound care needs. o FACE TO FACE ENCOUNTER: MEDICARE and MEDICAID PATIENTS: I certify that this patient is under my care and that I had a face-to-face encounter that meets the physician face-to-face encounter requirements with this patient on this date. The encounter with the patient was in whole or in part for the following MEDICAL CONDITION: (primary reason for [place]) MEDICAL NECESSITY: I certify, that based on my findings, NURSING services are a medically necessary home health service. HOME BOUND STATUS: I certify that my clinical findings support that this patient is homebound (i.e., Due to illness or injury, pt requires aid of supportive devices such as crutches, cane, wheelchairs, walkers, the use of special transportation or the assistance of another person to leave their place of residence. There is a normal inability to leave the home and doing so requires considerable and taxing effort. Other absences are for medical reasons / religious services and are infrequent or of short duration when for other reasons). o If current dressing causes regression in wound condition, may D/C ordered dressing product/s and apply Normal Saline Moist Dressing daily until next [place] / Other MD appointment. [place] of regression in wound condition at [phone]. o Please direct any NON-WOUND related issues/requests for orders to patient's Primary Care Physician Wound #7 Right,Dorsal Metatarsal head first o [place] Visits - [facility] twice weekly, Wound Care Center-Wednesday, teach family  member to change on off day. o Home Health Nurse may visit PRN  to address patientos wound care needs. o FACE TO FACE ENCOUNTER: MEDICARE and MEDICAID PATIENTS: I certify that this patient is under my care and that I had a face-to-face encounter that meets the physician face-to-face encounter requirements with this patient on this date. The encounter with the patient was in whole or in part for the following MEDICAL CONDITION: (primary reason for [place]) MEDICAL NECESSITY: I certify, that based on my findings, NURSING services are a medically necessary home health service. HOME BOUND STATUS: I certify that my clinical findings support that this patient is homebound (i.e., Due to illness or injury, pt requires aid of supportive devices such as crutches, cane, wheelchairs, walkers, the use of special transportation or the assistance of another person to leave their place of residence. There is a normal inability to leave the home and doing so requires considerable and taxing effort. Other absences are for medical reasons / religious services and are infrequent or of short duration when for other reasons). [place], Siomara V. ([id number]) o If current dressing causes regression in wound condition, may D/C ordered dressing product/s and apply Normal Saline Moist Dressing daily until next [place] / Other MD appointment. [place] of regression in wound condition at [phone]. o Please direct any NON-WOUND related issues/requests for orders to patient's Primary Care Physician Wound #8 Right,Dorsal Toe [place] Visits - [facility] twice weekly, W[facility] Care Center-Wednesday, teach family member to change on off day. o Home Health Nurse may visit PRN to address patientos wound care needs. o FACE TO FACE ENCOUNTER: MEDICARE and MEDICAID PATIENTS: I certify that this patient is under my care and that I had a face-to-face encounter  that meets the physician face-to-face encounter requirements with this patient on this date. The encounter with the patient was in whole or in part for the following MEDICAL CONDITION: (primary reason for [place]) MEDICAL NECESSITY: I certify, that based on my findings, NURSING services are a medically necessary home health service. HOME BOUND STATUS: I certify that my clinical findings support that this patient is homebound (i.e., Due to illness or injury, pt requires aid of supportive devices such as crutches, cane, wheelchairs, walkers, the use of special transportation or the assistance of another person to leave their place of residence. There is a normal inability to leave the home and doing so requires considerable and taxing effort. Other absences are for medical reasons / religious services and are infrequent or of short duration when for other reasons). o If current dressing causes regression in wound condition, may D/C ordered dressing product/s and apply Normal Saline Moist Dressing daily until next [place] / Other MD appointment. [place] of regression in wound condition at [phone]. o Please direct any NON-WOUND related issues/requests for orders to patient's Primary Care Physician Wound #9 [place] Visits - [facility] twice weekly, [facility] Care [facility]-Wednesday, teach family member to change on off day. o Home Health Nurse may visit PRN to address patientos wound care needs. o FACE TO FACE ENCOUNTER: MEDICARE and MEDICAID PATIENTS: I certify that this patient is under my care and that I had a face-to-face encounter that meets the physician face-to-face encounter requirements with this patient on this date. The encounter with the patient was in whole or in part for the following MEDICAL CONDITION: (primary reason for [place]) MEDICAL NECESSITY: I certify, that based on my findings,  NURSING services are a medically necessary home  health service. HOME BOUND STATUS: I certify that my clinical findings support that this patient is homebound (i.e., Due to illness or injury, pt requires aid of supportive devices such as crutches, cane, wheelchairs, walkers, the use of special transportation or the assistance of another person to leave their place of residence. There is a normal inability to leave the home and doing so requires considerable and taxing effort. Other absences are for medical reasons / religious services and are infrequent or of short duration when for other reasons). o If current dressing causes regression in wound condition, may D/C ordered dressing product/s and apply Normal Saline Moist Dressing daily until next [place] / Other MD appointment. [place] of regression in wound condition at [phone]. o Please direct any NON-WOUND related issues/requests for orders to patient's Primary Care Physician [name], [name] BEEGLE ([id number]) Medications-please add to medication list. Wound #7 Right,Dorsal Metatarsal head first o Santyl Enzymatic Ointment Wound #8 Right,Dorsal Toe Third o Santyl Enzymatic Ointment Wound #9 Right,Distal,Lateral Foot o Santyl Enzymatic Ointment Electronic Signature(s) Signed: 12/04/2016 5:23:29 PM By: Regan Lemming BSN, RN Signed: 12/04/2016 5:52:53 PM By: Linton Ham MD Entered By: Regan Lemming on 12/04/2016 10:40:10 Zapf, Kadiatou [name] ([id number]) -------------------------------------------------------------------------------- Problem List Details Patient Name: Postlewait, Molley V. Date of Service: 12/04/2016 10:15 AM Medical Record Patient Account Number: [id number] Number: Treating RN: Baruch Gouty, RN, BSN, Velva Harman [phone] y.o. 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 ([id number]) -------------------------------------------------------------------------------- Progress Note Details Patient Name: Badley, Chrystian V. Date of Service: 12/04/2016 10:15 AM Medical Record Patient Account Number: [id number] Number: Treating RN: Baruch Gouty, RN, BSN, Velva Harman 08-03-1941 [phone] y.o. Other Clinician: Date of Birth/Sex: Female) Treating Haldon Carley Primary Care Provider: Beverlyn Roux Provider/Extender: G Referring Provider: Dorthula Nettles in Treatment: 4 Subjective Chief Complaint Information obtained from Patient The patient is here for initial evaluation of RLE and Right foot/toe ulcers History of Present Illness (HPI) 08/13/16: This is a 76 year old woman who came predominantly for review of 3 cm in diameter circular wound to the left anterior lateral leg. She was in the ER on 08/01/16 I reviewed their notes. There was apparently pus coming out of the wound at that time and the patient arrived requesting debridement which they don't do in the emergency  room. Nevertheless I can't see that they did any x-rays. There were no cultures done. She is a type II diabetic and I a note after the patient was in the clinic that she had a bypass graft from the popliteal to the tibial on the right on 02/28/16. She also had a right greater saphenous vein harvest on the same  date for arterial bypass. She is going to have vascular studies including ABIs T ABIs on the right on 08/28/16. The patient's surgery was on 02/28/16 by Dr. Vallarie Mare she had a right below the knee popliteal artery to peroneal artery bypass with reverse greater saphenous vein and an endarterectomy of the mid segment peroneal artery. Postoperatively she had a strong mild monophasic peroneal signal with a pink foot. It would appear that the patient is had some nonhealing in the surgical saphenous vein harvest site on the left leg. Surprisingly looking through [facility] I cannot see much information about this at all. Dr. Lucious Groves notes from 05/29/16 show that the patient's wounds "are not healed" the right first metatarsal wound healed but then opened back up. 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 At that point the patient had a wound VAC to her right calf with regards to the wounds on her dorsal right toe would appear that these are felt to be arterial wounds. She has had surgery on the metatarsal phalangeal in 2015 I Dr. Doran Durand secondary to a right metatarsal phalangeal joint fracture. She is apparently had discoloration around this area since then. 08/28/16; patient arrives with her wounds in much the same condition. The linear vein harvest site and the circular wound below it which I think was a blister. She also has to probing holes in her right great toe and a necrotic eschar on the right second toe. Because of these being arterial wounds I reduced her compression from 3-2 layers this  seems to of done satisfactorily she has not had any problems. I cannot see that she is actually had an x-ray ====== 11/06/16 the patient comes in for evaluation of her right lower extremity ulcers. She was here in January 2018 for 2 visits subsequently ended up in the hospital with pneumonia and then to rehabilitation. She has now been [place] ([id number]) discharged from rehabilitation and is home. She has multiple ulcerations to her right lower extremity including the foot and toes. She does have home health in place and they have been placing alginate to the ulcers. She is followed by Dr. Bridgett Larsson of vascular medicine. 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 She recently saw him on 3/23. In office ABIs were: Right 0.48 with monophasic flow to the DP, PT, peroneal Left 0.63 with monophasic flow to the DP and PT Her arterial studies indicated a patent right below knee popliteal to peroneal bypass She had an MRI in February 2008 that was negative frosty myelitis but this showed general soft tissue edema in the right foot and lower extremity concerning for cellulitis She is a diabetic, managed with insulin. Her hemoglobin A1c in December 2017 was 8.4 which is a trend up from previous levels. She had blood work in February 2018 which revealed an albumin of 2.6 this appears to be relatively acute as an albumin in November 2017 was 3.7 11/13/16; this is a patient I have not seen since February who is readmitted to our clinic last week. 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 Bridgett Larsson. I have reviewed Dr. Lianne Moris notes from March/23/18. Doppler ABI on that date showed an ABI on the right of 0.48 and on the left of 0.63. Dorsalis pedis waveforms  were monophasic bilaterally. There was no waveforms detected at the posterior tibial on the right, monophasic on the  left. Dr. Lianne Moris comments were that this patient would have follow-up vascular studies in 3 months including ABIs and right lower extremity arterial duplex. She had an MRI in February that was negative for osteomyelitis but showed generalized edema in the foot. Last albumin I see was in January at 3.4 we have been using Santyl to the 4 wounds on the right leg the patient is noted today to have widespread edema well up towards her groin this is pitting 2-3+. 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 Normal ejection fraction. 11/20/16; patient has a follow-up appointment with Dr. Bridgett Larsson on April 23. She is still complaining of a lot of pain in the right foot and right leg. It is not clear to me that this is at all positional however I think it is clear claudication with minimal activity perhaps at rest. At our suggestion she is return to her primary physician's office tomorrow with regards to her pitting lower extremity bilateral edema that I reviewed in detail last week 11/27/16; the follow-up with Dr. Bridgett Larsson was actually on May 23 on April 23 as I stated in my note last week. N/A case all of her wounds seems somewhat smaller. The 2 on the right leg are definitely smaller. The areas on the dorsal right first toe, right third toe and the lateral part of the right fifth metatarsal head all looks smaller but have tightly adherent surfaces. We have been using Santyl 12/04/16; follow-up with Dr. Bridgett Larsson on May 23. 2 small open wounds on the right leg continue to get smaller. The area on the right third total lateral aspect of the right fifth toe also look better. The remaining area on the dorsal first toe still has some depth to it. We have been using Santyl to the toes and collagen on the right Objective Constitutional Sitting or standing Blood Pressure is within target range for patient.. Pulse regular and within target range for patient.[name]  Respirations regular, non-labored and within target range.. Temperature is normal and within Seavey, Jordyne V. ([id number]) the target range for the patient.. Patient's appearance is neat and clean. Appears in no acute distress. Well nourished and well developed.. Vitals Time Taken: 10:14 AM, Height: 64 in, Weight: 200 lbs, BMI: 34.3, Temperature: 97.7 F, Pulse: 75 bpm, Respiratory Rate: 18 breaths/min, Blood Pressure: 134/56 mmHg. General Notes: Wound exam; 2 small open wounds on the right leg, one in the vein harvest site and one below it both looks smaller. She has 3 wounds on the right foot including the base of the right first toe which was debrided with a #3 curet of surface slough. Base of the third toe also debrided of necrotic surface material. Lateral aspect of the right fifth toe did not require debridement. All of these looks somewhat better to me Integumentary (Hair, Skin) Wound #5 status is Open. Original cause of wound was Gradually Appeared. The wound is located on the Right,Distal Lower Leg. The wound measures 0.5cm length x 0.5cm width x 0.1cm depth; 0.196cm^2 area and 0.02cm^3 volume. There is Fat Layer (Subcutaneous Tissue) Exposed exposed. There is no tunneling or undermining noted. There is a small amount of serous drainage noted. The wound margin is flat and intact. There is large (67-100%) pink, friable granulation within the wound bed. There is no necrotic tissue within the wound bed. The periwound skin  appearance exhibited: Scarring, Hemosiderin Staining. The periwound skin appearance did not exhibit: Callus, Crepitus, Excoriation, Induration, Rash, Dry/Scaly, Maceration, Atrophie Blanche, Cyanosis, Ecchymosis, Mottled, Pallor, Rubor, Erythema. Periwound temperature was noted as No Abnormality. Wound #6 status is Open. Original cause of wound was Gradually Appeared. The wound is located on the Right,Proximal Lower Leg. The wound measures 1cm length x 0.5cm width x 0.1cm  depth; 0.393cm^2 area and 0.039cm^3 volume. There is Fat Layer (Subcutaneous Tissue) Exposed exposed. There is no tunneling or undermining noted. There is a medium amount of serous drainage noted. The wound margin is flat and intact. There is no granulation within the wound bed. There is a large (67-100%) amount of necrotic tissue within the wound bed including Adherent Slough. The periwound skin appearance exhibited: Scarring, Hemosiderin Staining. The periwound skin appearance did not exhibit: Callus, Crepitus, Excoriation, Induration, Rash, Dry/Scaly, Maceration, Atrophie Blanche, Cyanosis, Ecchymosis, Mottled, Pallor, Rubor, Erythema. Periwound temperature was noted as No Abnormality. The periwound has tenderness on palpation. Wound #7 status is Open. Original cause of wound was Gradually Appeared. The wound is located on the Right,Dorsal Metatarsal head first. The wound measures 2.5cm length x 1cm width x 0.2cm depth; 1.963cm^2 area and 0.393cm^3 volume. There is Fat Layer (Subcutaneous Tissue) Exposed exposed. There is no tunneling or undermining noted. There is a large amount of serous drainage noted. The wound margin is flat and intact. There is no granulation within the wound bed. There is a large (67-100%) amount of necrotic tissue within the wound bed including Adherent Slough. The periwound skin appearance exhibited: Scarring, Maceration, Ecchymosis, Hemosiderin Staining, Mottled. Periwound temperature was noted as No Abnormality. The periwound has tenderness on palpation. Wound #8 status is Open. Original cause of wound was Gradually Appeared. The wound is located on the Right,Dorsal Toe Third. The wound measures 0.5cm length x 0.7cm width x 0.1cm depth; 0.275cm^2 area and 0.027cm^3 volume. The wound is limited to skin breakdown. There is no tunneling or undermining noted. There is a large amount of serous drainage noted. The wound margin is flat and intact. There is no granulation  within the wound bed. There is a large (67-100%) amount of necrotic tissue within the wound bed including Adherent Slough. The periwound skin appearance exhibited: Maceration. The periwound skin [name], [name] ([id number]) appearance did not exhibit: Dry/Scaly. Periwound temperature was noted as No Abnormality. The periwound has tenderness on palpation. Wound #9 status is Open. Original cause of wound was Gradually Appeared. The wound is located on the Right,Distal,Lateral Foot. The wound measures 2.5cm length x 0.5cm width x 0.1cm depth; 0.982cm^2 area and 0.098cm^3 volume. There is Fat Layer (Subcutaneous Tissue) Exposed exposed. There is no tunneling or undermining noted. There is a large amount of serous drainage noted. The wound margin is indistinct and nonvisible. There is no granulation within the wound bed. There is a large (67-100%) amount of necrotic tissue within the wound bed including Adherent Slough. The periwound skin appearance exhibited: Maceration, Hemosiderin Staining. The periwound skin appearance did not exhibit: Callus, Crepitus, Excoriation, Induration, Rash, Scarring, Dry/Scaly, Atrophie Blanche, Cyanosis, Ecchymosis, Mottled, Pallor, Rubor, Erythema. Periwound temperature was noted as No Abnormality. The periwound has tenderness on palpation. 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 There was a Skin/Subcutaneous Tissue Debridement ([id number]) debridement with total area of 2.5 sq cm performed by Ricard Dillon, MD. with the following instrument(s): Curette to remove Non-Viable tissue/material including Fibrin/Slough and Subcutaneous after achieving pain control using Lidocaine 4% Topical Solution. A time out was conducted at 10:34, prior to the start of the procedure. A Minimum amount of bleeding was controlled with Pressure. The procedure was tolerated well with a pain level of 0 throughout and a pain level of 0 following the procedure. Post Debridement Measurements: 2.5cm length x 1cm width x 0.2cm depth; 0.393cm^3 volume. Character of Wound/Ulcer Post Debridement requires further debridement. Severity of Tissue Post Debridement is: Fat layer exposed. Post procedure Diagnosis Wound #7: Same as Pre-Procedure [name], [name] ([id number]) Wound #8 Wound #8 is a Diabetic Wound/Ulcer of the Lower Extremity located on the Right,Dorsal Toe Third . There was a Skin/Subcutaneous Tissue Debridement ([id number]) debridement with total area of 0.35 sq cm performed by Ricard Dillon, MD. with the following instrument(s): Curette to remove Non-Viable tissue/material including Fibrin/Slough and Subcutaneous after achieving pain control using Lidocaine 4% Topical Solution. A time out was conducted at 10:34, prior to the start of the procedure. A Minimum amount of bleeding was controlled with Pressure. The procedure was tolerated well with a pain level of 0 throughout and a pain level of 0 following the procedure. Post Debridement Measurements: 0.5cm length x 0.7cm width x 0.2cm depth; 0.055cm^3 volume. Character of Wound/Ulcer Post Debridement requires further debridement. Severity of Tissue Post Debridement is: Fat layer exposed. Post procedure Diagnosis Wound #8: Same as Pre-Procedure Plan Wound  Cleansing: Wound #5 Right,Distal Lower Leg: Clean wound with Normal Saline. Cleanse wound with mild soap and water Wound #6 Right,Proximal Lower Leg: Clean wound with Normal Saline. Cleanse wound with mild soap and water Wound #7 Right,Dorsal Metatarsal head first: Clean wound with Normal Saline. Cleanse wound with mild soap and water Wound #8 Right,Dorsal Toe Third: Clean wound with Normal Saline. Cleanse wound with mild soap and water Wound #9 Right,Distal,Lateral Foot: Clean wound with Normal Saline. Cleanse wound with mild soap and water Anesthetic: Wound #5 Right,Distal Lower Leg: Topical Lidocaine 4% cream applied to wound bed prior to debridement Wound #6 Right,Proximal Lower Leg: Topical Lidocaine 4% cream applied to wound bed prior to debridement Wound #7 Right,Dorsal Metatarsal head first: Topical Lidocaine 4% cream applied to wound bed prior to debridement Wound #8 Right,Dorsal Toe Third: Topical Lidocaine 4% cream applied to wound bed prior to debridement Wound #9 Right,Distal,Lateral Foot: Topical Lidocaine 4% cream applied to wound bed prior to debridement Primary Wound Dressing: Wound #5 Right,Distal Lower Leg: Phagan, Kyna V. ([id number]) Prisma Ag Wound #6 Right,Proximal Lower Leg: Prisma Ag Wound #7 Right,Dorsal Metatarsal head first: Prisma Ag Wound #8 Right,Dorsal Toe Third: Prisma Ag Wound #9 Right,Distal,Lateral Foot: Prisma Ag Secondary Dressing: Wound #5 Right,Distal Lower Leg: Conform/Kerlix Wound #6 Right,Proximal Lower Leg: Conform/Kerlix Wound #7 Right,Dorsal Metatarsal head first: Dry Gauze Conform/Kerlix Wound #8 Right,Dorsal Toe Third: Dry Gauze Conform/Kerlix Wound #9 Right,Distal,Lateral Foot: Dry Gauze Conform/Kerlix Dressing Change Frequency: Wound #5 Right,Distal Lower Leg: Three times weekly Wound #6 Right,Proximal Lower Leg: Three times weekly Wound #7 Right,Dorsal  Metatarsal head first: Change dressing every day. Wound  #8 Right,Dorsal Toe Third: Change dressing every day. Wound #9 Right,Distal,Lateral Foot: Change dressing every day. Follow-up Appointments: Wound #5 Right,Distal Lower Leg: Return Appointment in 1 week. Wound #6 Right,Proximal Lower Leg: Return Appointment in 1 week. Wound #7 Right,Dorsal Metatarsal head first: Return Appointment in 1 week. Wound #8 Right,Dorsal Toe Third: Return Appointment in 1 week. Wound #9 Right,Distal,Lateral Foot: Return Appointment in 1 week. Edema Control: Wound #5 Right,Distal Lower Leg: Elevate legs to the level of the heart and pump ankles as often as possible Wound #6 Right,Proximal Lower Leg: Elevate legs to the level of the heart and pump ankles as often as possible Wound #7 Right,Dorsal Metatarsal head first: [name], [name] ([id number]) Elevate legs to the level of the heart and pump ankles as often as possible Wound #8 Right,Dorsal Toe Third: Elevate legs to the level of the heart and pump ankles as often as possible Wound #9 Right,Distal,Lateral Foot: Elevate legs to the level of the heart and pump ankles as often as possible Home Health: Wound #5 Right,Distal Lower Leg: Continue Home Health Visits - [facility] weekly, [facility], teach family member to change on off day. Home Health Nurse may visit PRN to address patient s wound care needs. FACE TO FACE ENCOUNTER: MEDICARE and MEDICAID PATIENTS: I certify that this patient is under my care and that I had a face-to-face encounter that meets the physician face-to-face encounter requirements with this patient on this date. The encounter with the patient was in whole or in part for the following MEDICAL CONDITION: (primary reason for [place]) MEDICAL NECESSITY: I certify, that based on my findings, NURSING services are a medically necessary home health service. HOME BOUND STATUS: I certify that my clinical findings support that this patient is homebound (i.e., Due  to illness or injury, pt requires aid of supportive devices such as crutches, cane, wheelchairs, walkers, the use of special transportation or the assistance of another person to leave their place of residence. There is a normal inability to leave the home and doing so requires considerable and taxing effort. Other absences are for medical reasons / religious services and are infrequent or of short duration when for other reasons). If current dressing causes regression in wound condition, may D/C ordered dressing product/s and apply Normal Saline Moist Dressing daily until next [place] / Other MD appointment. [place] of regression in wound condition at [phone]. Please direct any NON-WOUND related issues/requests for orders to patient's Primary Care Physician Wound #6 Right,Proximal Lower Leg: [place] Visits - [facility] twice weekly, [facility]-Wednesday, teach family member to change on off day. Home Health Nurse may visit PRN to address patient s wound care needs. FACE TO FACE ENCOUNTER: MEDICARE and MEDICAID PATIENTS: I certify that this patient is under my care and that I had a face-to-face encounter that meets the physician face-to-face encounter requirements with this patient on this date. The encounter with the patient was in whole or in part for the following MEDICAL CONDITION: (primary reason for [place]) MEDICAL NECESSITY: I certify, that based on my findings, NURSING services are a medically necessary home health service. HOME BOUND STATUS: I certify that my clinical findings support that this patient is homebound (i.e., Due to illness or injury, pt requires aid of supportive devices such as crutches, cane, wheelchairs, walkers, the use of special transportation or the assistance of another person to leave their place  of residence. There is a normal inability to leave the home and doing so requires considerable and  taxing effort. Other absences are for medical reasons / religious services and are infrequent or of short duration when for other reasons). If current dressing causes regression in wound condition, may D/C ordered dressing product/s and apply Normal Saline Moist Dressing daily until next [place] / Other MD appointment. [place] of regression in wound condition at [phone]. Please direct any NON-WOUND related issues/requests for orders to patient's Primary Care Physician Wound #7 Right,Dorsal Metatarsal head first: [place] Visits - [facility] twice weekly, [facility]-Wednesday, teach family member to change on off day. Home Health Nurse may visit [facility] to address patient s wound care needs. FACE TO FACE ENCOUNTER: MEDICARE and MEDICAID PATIENTS: I certify that this patient is under my care and that I had a face-to-face encounter that meets the physician face-to-face encounter requirements with this patient on this date. The encounter with the patient was in whole or in part for the following MEDICAL CONDITION: (primary reason for [place]) MEDICAL NECESSITY: I certify, that based on my findings, NURSING services are a medically necessary home health service. HOME [place], [place] ([id number]) BOUND STATUS: I certify that my clinical findings support that this patient is homebound (i.e., Due to illness or injury, pt requires aid of supportive devices such as crutches, cane, wheelchairs, walkers, the use of special transportation or the assistance of another person to leave their place of residence. There is a normal inability to leave the home and doing so requires considerable and taxing effort. Other absences are for medical reasons / religious services and are infrequent or of short duration when for other reasons). If current dressing causes regression in wound condition, may D/C ordered dressing product/s and apply Normal Saline Moist  Dressing daily until next [place] / Other MD appointment. [place] of regression in wound condition at [phone]. Please direct any NON-WOUND related issues/requests for orders to patient's Primary Care Physician Wound #8 Right,Dorsal Toe Third: [place] [facility] - [facility] weekly, [facility], teach family member to change on off day. Home Health Nurse may visit [facility] to address patient s wound care needs. FACE TO FACE ENCOUNTER: MEDICARE and MEDICAID PATIENTS: I certify that this patient is under my care and that I had a face-to-face encounter that meets the physician face-to-face encounter requirements with this patient on this date. The encounter with the patient was in whole or in part for the following MEDICAL CONDITION: (primary reason for [place]) MEDICAL NECESSITY: I certify, that based on my findings, NURSING services are a medically necessary home health service. HOME BOUND STATUS: I certify that my clinical findings support that this patient is homebound (i.e., Due to illness or injury, pt requires aid of supportive devices such as crutches, cane, wheelchairs, walkers, the use of special transportation or the assistance of another person to leave their place of residence. There is a normal inability to leave the home and doing so requires considerable and taxing effort. Other absences are for medical reasons / religious services and are infrequent or of short duration when for other reasons). If current dressing causes regression in wound condition, may D/C ordered dressing product/s and apply Normal Saline Moist Dressing daily until next [place] / Other MD appointment. [place] of regression in wound condition at [phone]. Please direct any NON-WOUND related issues/requests for orders  to patient's Primary Care Physician Wound #9 Right,Distal,Lateral Foot: [place] [facility]its - [facility] weekly, [facility]-Wednesday, teach family member to change on off day. Home Health Nurse may visit [facility] to address patient s wound care needs. FACE TO FACE ENCOUNTER: MEDICARE and MEDICAID PATIENTS: I certify that this patient is under my care and that I had a face-to-face encounter that meets the physician face-to-face encounter requirements with this patient on this date. The encounter with the patient was in whole or in part for the following MEDICAL CONDITION: (primary reason for [place]) MEDICAL NECESSITY: I certify, that based on my findings, NURSING services are a medically necessary home health service. HOME BOUND STATUS: I certify that my clinical findings support that this patient is homebound (i.e., Due to illness or injury, pt requires aid of supportive devices such as crutches, cane, wheelchairs, walkers, the use of special transportation or the assistance of another person to leave their place of residence. There is a normal inability to leave the home and doing so requires considerable and taxing effort. Other absences are for medical reasons / religious services and are infrequent or of short duration when for other reasons). If current dressing causes regression in wound condition, may D/C ordered dressing product/s and apply Normal Saline Moist Dressing daily until next [place] / Other MD appointment. [place] of regression in wound condition at [phone]. Please direct any NON-WOUND related issues/requests for orders to patient's Primary Care Physician Medications-please add to medication list.: Wound #7 Right,Dorsal Metatarsal head first: Santyl Enzymatic Ointment Wound #8 Right,Dorsal Toe Third: Santyl Enzymatic Ointment Wound #9 Right,Distal,Lateral Foot: [place], Salinda V. ([id number]) Santyl Enzymatic Ointment #1 I'm going to try silver collagen on all wounds. The concerning area here is on  the dorsal aspect of the right first toe which has some depth and will look carefully at that next week. We have been using Santyl to this area. The area on the right third toe and the lateral aspect of the fifth toe look as though they might start to be stimulated with the collagen-based dressing. The areas o2 on the right leg look a lot better we will continue with collagen here Electronic Signature(s) Signed: 12/11/2016 11:44:56 AM By: Gretta Cool RN, BSN, Kim RN, BSN Signed: 12/11/2016 5:27:50 PM By: Linton Ham MD Previous Signature: 12/04/2016 5:52:53 PM Version By: Linton Ham MD Entered By: Gretta Cool RN, BSN, Kim on 12/11/2016 11:44:55 Bossler, Blessed V. ([id number]) -------------------------------------------------------------------------------- SuperBill Details Patient Name: Shands, Rachelanne V. Date of Service: 12/04/2016 Medical Record Patient Account Number: [id number] Number: Treating RN: Baruch Gouty, RN, BSN, Velva Harman 17-Aug-1940 [phone] y.o. 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 diabetic peripheral angiopathy with gangrene I70.232 Atherosclerosis 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 of calf I70.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 of foot Facility Procedures CPT4: Description Modifier Quantity Code 38101751 11042 - DEB SUBQ TISSUE 20 SQ CM/< 1 ICD-10 Description Diagnosis E11.622 Type 2 diabetes mellitus with other skin ulcer L97.519  Non-pressure chronic ulcer of other part of  right foot with unspecified  severity 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

## 2016-12-11 ENCOUNTER — Encounter: Payer: Medicare HMO | Admitting: Internal Medicine

## 2016-12-11 DIAGNOSIS — E11622 Type 2 diabetes mellitus with other skin ulcer: Secondary | ICD-10-CM | POA: Diagnosis not present

## 2016-12-12 NOTE — Progress Notes (Addendum)
DANI, WALLNER ([id number]) Visit Report for 12/11/2016 Arrival Information Details Patient Name: [name], [name] Date of Service: 12/11/2016 8:15 AM Medical Record Number: [id number] Patient Account Number: [id number] Date of Birth/Sex: Jul 14, 1941 (76 y.o. Female) Treating RN: Afful, RN, BSN, Velva Harman Primary Care Natasha Burda: Beverlyn Roux Other Clinician: Referring Jesenya Bowditch: Beverlyn Roux Treating Adisynn Suleiman/Extender: Tito Dine in Treatment: 5 Visit Information History Since Last Visit All ordered tests and consults were completed: No Patient Arrived: Wheel Chair Added or deleted any medications: No Arrival Time: 08:17 Any new allergies or adverse reactions: No Accompanied By: hubby Had a fall or experienced change in No activities of daily living that may affect Transfer Assistance: Manual risk of falls: Patient Identification Verified: Yes Signs or symptoms of abuse/neglect since last No Secondary Verification Process Yes visito Completed: Hospitalized since last visit: No Patient Requires Transmission-Based No Has Dressing in Place as Prescribed: Yes Precautions: Pain Present Now: No Patient Has Alerts: No Electronic Signature(s) Signed: 12/11/2016 8:17:55 AM By: Regan Lemming BSN, RN Entered By: Regan Lemming on 12/11/2016 08:17:55 Oyster, [name] ([id number]) -------------------------------------------------------------------------------- Encounter Discharge Information Details Patient Name: [name], [name] Date of Service: 12/11/2016 8:15 AM Medical Record Number: [id number] Patient Account Number: [id number] Date of Birth/Sex: 10-15-1940 (76 y.o. Female) Treating RN: Afful, RN, BSN, Velva Harman Primary Care Earlie Arciga: Beverlyn Roux Other Clinician: Referring Kamile Fassler: Beverlyn Roux Treating Ademola Vert/Extender: Tito Dine in Treatment: 5 Encounter Discharge Information Items Discharge Pain Level: 0 Discharge Condition: Stable Ambulatory Status: Wheelchair Discharge  Destination: Home Transportation: Private Auto Accompanied By: Micheline Rough Schedule Follow-up Appointment: No Medication Reconciliation completed No and provided to Patient/Care Marlissa Emerick: Provided on Clinical Summary of Care: 12/11/2016 Form Type Recipient Paper Patient [facility] Electronic Signature(s) Signed: 12/11/2016 9:04:48 AM By: Ruthine Dose Entered By: Ruthine Dose on 12/11/2016 09:04:48 Weigel, [name] ([id number]) -------------------------------------------------------------------------------- Lower Extremity Assessment Details Patient Name: [name], [name] Date of Service: 12/11/2016 8:15 AM Medical Record Number: [id number] Patient Account Number: [id number] Date of Birth/Sex: 17-Nov-1940 (77 y.o. Female) Treating RN: Afful, RN, BSN, Velva Harman Primary Care Melah Ebling: Beverlyn Roux Other Clinician: Referring Grant Swager: Beverlyn Roux Treating Carlito Bogert/Extender: Tito Dine in Treatment: 5 Edema Assessment Assessed: [Left: No] [Right: No] E[Left: dema] [Right: :] Calf Left: Right: Point of Measurement: 34 cm From Medial Instep cm 44.4 cm Ankle Left: Right: Point of Measurement: 11 cm From Medial Instep cm 28.2 cm Vascular Assessment Claudication: Claudication Assessment [Right:None] Pulses: Dorsalis Pedis Palpable: [Right:Yes] Posterior Tibial Extremity colors, hair growth, and conditions: Extremity Color: [Right:Mottled] Hair Growth on Extremity: [Right:Yes] Temperature of Extremity: [Right:Warm] Capillary Refill: [Right:< 3 seconds] Toe Nail Assessment Left: Right: Thick: Yes Discolored: Yes Deformed: No Improper Length and Hygiene: No Electronic Signature(s) Signed: 12/11/2016 5:00:18 PM By: Regan Lemming BSN, RN Entered By: Regan Lemming on 12/11/2016 08:22:51 Inglis, [name] ([id number]) [name], [name] ([id number]) -------------------------------------------------------------------------------- Multi Wound Chart Details Patient Name: [name], [name] Date of  Service: 12/11/2016 8:15 AM Medical Record Number: [id number] Patient Account Number: [id number] Date of Birth/Sex: April 21, 1941 (76 y.o. 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] [  6:the Lower Extremity] Comorbid History: [5:Cataracts, Chronic sinus Cataracts, Chronic sinus problems/congestion, Congestive Heart Failure, Congestive Heart Failure, Hypertension, Type II Diabetes] [6:problems/congestion, Hypertension, Type II Diabetes] Date Acquired: [5:09/09/2016] [6:09/09/2016] Weeks of Treatment: [5:5] [6:5] Wound Status: [5:Open] [6:Open] Measurements L x W x D 0.3x0.3x0.1 [6:1x0.3x0.1] (cm) Area (cm) : [5:0.071] [6:0.236] Volume (cm) : [5:0.007] [6:0.024] % Reduction in Area: [5:96.00%] [6:24.80%] % Reduction in Volume: 96.00% [6:22.60%] Classification: [5:Grade 1] [6:Grade 2] Exudate Amount: [5:Small] [6:Small] Exudate Type: [5:Serous] [6:Serous] Exudate Color: [5:amber] [6:amber] Wound Margin: [5:Flat and Intact] [6:Flat and Intact] Granulation Amount: [5:Large (67-100%)] [6:None Present (0%)] Granulation Quality: [5:Pink, Friable] [6:N/A] Necrotic Amount: [5:None Present (0%)] [6:Large (67-100%)] Exposed Structures: [5:Fat Layer (Subcutaneous Fat Layer (Subcutaneous Tissue) Exposed: Yes] [6:Tissue) Exposed: Yes] Fascia: No Fascia: No Fascia: No Tendon: No Tendon: No Tendon: No Muscle:  No Muscle: No Muscle: No Joint: No Joint: No Joint: No Bone: No Bone: No Bone: No Epithelialization: Large (67-100%) Medium (34-66%) Medium (34-66%) Debridement: N/A N/A N/A Pain Control: N/A N/A N/A Tissue Debrided: N/A N/A N/A Level: N/A N/A N/A Debridement Area (sq N/A N/A N/A cm): Instrument: N/A N/A N/A Bleeding: N/A N/A N/A Hemostasis Achieved: N/A N/A N/A Procedural Pain: N/A N/A N/A Post Procedural Pain: N/A N/A N/A Debridement Treatment N/A N/A N/A Response: Post Debridement N/A N/A N/A Measurements L x W x D (cm) Post Debridement N/A N/A N/A Volume: (cm) Periwound Skin Texture: Scarring: Yes Scarring: Yes No Abnormalities Noted Excoriation: No Excoriation: No Induration: No Induration: No Callus: No Callus: No Crepitus: No Crepitus: No Rash: No Rash: No Periwound Skin Maceration: Yes Maceration: No No Abnormalities Noted Moisture: Dry/Scaly: No Dry/Scaly: No Periwound Skin Color: Hemosiderin Staining: Yes Hemosiderin Staining: Yes No Abnormalities Noted Atrophie Blanche: No Atrophie Blanche: No Cyanosis: No Cyanosis: No Ecchymosis: No Ecchymosis: No Erythema: No Erythema: No Mottled: No Mottled: No Pallor: No Pallor: No Rubor: No Rubor: No Temperature: No Abnormality No Abnormality No Abnormality Tenderness on No Yes No Palpation: Wound Preparation: Ulcer Cleansing: Ulcer Cleansing: Ulcer Cleansing: Rinsed/Irrigated with Rinsed/Irrigated with Rinsed/Irrigated with Saline Saline Saline Topical Anesthetic Topical Anesthetic Topical Anesthetic Applied: None Applied: Other: lidocaine Applied: Other: lidocaine 4% 4% [name], [name] 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 the Lower Extremity the Lower Extremity Comorbid History: Cataracts, Chronic sinus Cataracts, Chronic sinus Cataracts, Chronic sinus problems/congestion, problems/congestion, problems/congestion, Congestive Heart Failure, Congestive Heart Failure, Congestive Heart Failure, Hypertension, Type II Hypertension, Type II Hypertension, Type II Diabetes Diabetes Diabetes Date Acquired: 08/06/2013 10/28/2016 10/14/2016 Weeks of Treatment: 5 5 5  Wound Status: Open Open Open Measurements L x W x D 2.4x0.8x0.2 0.4x0.5x0.1 2.7x0.4x0.1 (cm) Area (cm) : 1.508 0.157 0.848 Volume (cm) : 0.302 0.016 0.085 % Reduction in Area: -28.00% 79.20% 13.60% % Reduction in Volume: 14.40% 78.70% 56.60% Classification: Grade 2 Grade 1 Grade 1 Exudate Amount: Large Large Medium Exudate Type: Serous Serous Serous Exudate Color: amber amber amber Wound Margin: Flat and Intact Flat and Intact Indistinct, nonvisible Granulation Amount: None Present (0%) Medium (34-66%) Small (1-33%) Granulation Quality: N/A Pink, Pale Pink, Pale Necrotic Amount: Large (67-100%) Small (1-33%) Medium (34-66%) Exposed Structures: Fat Layer (Subcutaneous Fat Layer (Subcutaneous Fat Layer (Subcutaneous Tissue) Exposed: Yes Tissue) Exposed:  Yes Tissue) Exposed: Yes Fascia: No Fascia: No Fascia: No Tendon: No Tendon: No Tendon: No Muscle: No Muscle: No Muscle: No Joint: No Joint: No Joint: No Bone: No Bone: No Bone: No Epithelialization: None None Small (1-33%) Debridement: Debridement ([id number] N/A N/A [id number]) Pre-procedure 08:40 N/A N/A Verification/Time Out Taken: Pain Control: Lidocaine 4% Topical N/A N/A Solution Tissue Debrided: Fibrin/Slough, Fat, N/A N/A Exudates, Subcutaneous Level: N/A N/A Mascio, Joycelynn V. ([id number]) Skin/Subcutaneous Tissue Debridement Area (sq 1.92 N/A N/A cm): Instrument: Curette N/A N/A Bleeding:  Minimum N/A N/A Hemostasis Achieved: Pressure N/A N/A Procedural Pain: 0 N/A N/A Post Procedural Pain: 0 N/A N/A Debridement Treatment Procedure was tolerated N/A N/A Response: well Post Debridement 2.4x0.8x0.2 N/A N/A Measurements L x W x D (cm) Post Debridement 0.302 N/A N/A Volume: (cm) Periwound Skin Texture: Induration: Yes Scarring: Yes Excoriation: No Scarring: Yes Excoriation: No Induration: No Induration: No Callus: No Callus: No Crepitus: No Crepitus: No Rash: No Rash: No Scarring: No Periwound Skin Maceration: Yes Maceration: No Maceration: Yes Moisture: Dry/Scaly: No Dry/Scaly: No Periwound Skin Color: Ecchymosis: Yes Mottled: Yes Hemosiderin Staining: Yes Hemosiderin Staining: Yes Atrophie Blanche: No Atrophie Blanche: No Mottled: Yes Cyanosis: No Cyanosis: No Ecchymosis: No Ecchymosis: No Erythema: No Erythema: No Hemosiderin Staining: No Mottled: No Pallor: No Pallor: No Rubor: No Rubor: No Temperature: No Abnormality No Abnormality No Abnormality Tenderness on Yes Yes Yes Palpation: Wound Preparation: Ulcer Cleansing: Ulcer Cleansing: Ulcer Cleansing: Rinsed/Irrigated with Rinsed/Irrigated with Rinsed/Irrigated with Saline Saline Saline Topical Anesthetic Topical Anesthetic Topical Anesthetic Applied: Other: lidociane Applied: Other: lidocaine Applied: Other: lidocaine 4% 4% 4% Procedures Performed: Debridement N/A N/A Treatment Notes Electronic Signature(s) Signed: 12/11/2016 5:27:22 PM By: Linton Ham MD Entered By: Linton Ham on 12/11/2016 08:54:56 [name], [name] ([id number]) [name], [name] ([id number]) -------------------------------------------------------------------------------- Multi-Disciplinary Care Plan Details Patient Name: [name], [name] Date of Service: 12/11/2016 8:15 AM Medical Record Number: [id number] Patient Account Number: [id number] Date of Birth/Sex: Dec 18, 1940 (76 y.o. Female) Treating RN: Afful, RN,  BSN, Velva Harman Primary Care Ladana Chavero: Beverlyn Roux Other Clinician: Referring Lanard Arguijo: Beverlyn Roux Treating Deryck Hippler/Extender: Tito Dine in Treatment: 5 Active Inactive ` Abuse / Safety / Falls / Self Care Management Nursing Diagnoses: Impaired physical mobility Potential for falls Goals: Patient will remain injury free Date Initiated: 11/06/2016 Target Resolution Date: 12/30/2016 Goal Status: Active Patient/caregiver will verbalize understanding of skin care regimen Date Initiated: 11/06/2016 Target Resolution Date: 12/30/2016 Goal Status: Active Interventions: Assess fall risk on admission and as needed Treatment Activities: Patient referred to home care : 11/06/2016 Notes: ` Nutrition Nursing Diagnoses: Potential for alteratiion in Nutrition/Potential for imbalanced nutrition Goals: Patient/caregiver verbalizes understanding of need to maintain therapeutic glucose control per primary care physician Date Initiated: 11/06/2016 Target Resolution Date: 12/30/2016 Goal Status: Active Interventions: Provide education on elevated blood sugars and impact on wound healing [name], [name] ([id number]) Notes: ` Orientation to the Wound Care Program Nursing Diagnoses: Knowledge deficit related to the wound healing center program Goals: Patient/caregiver will verbalize understanding of the [facility] Date Initiated: 11/06/2016 Target Resolution Date: 12/30/2016 Goal Status: Active Interventions: Provide education on orientation to the wound center Notes: ` Venous Leg Ulcer Nursing Diagnoses: Actual venous Insuffiency (use after diagnosis is confirmed) Knowledge deficit related to disease process and management Goals: Non-invasive venous studies are completed as ordered Date Initiated: 11/06/2016 Target Resolution Date: 12/30/2016 Goal Status: Active Patient/caregiver will verbalize understanding of disease process and disease management Date Initiated:  11/06/2016 Target Resolution Date: 12/30/2016 Goal  Status: Active Interventions: Assess peripheral edema status every visit. Notes: ` Wound/Skin Impairment Nursing Diagnoses: Impaired tissue integrity Knowledge deficit related to smoking impact on wound healing Knowledge deficit related to ulceration/compromised skin integrity Goals: [name], [place] V. ([id number]) Ulcer/skin breakdown will heal within 14 weeks Date Initiated: 11/06/2016 Target Resolution Date: 02/05/2017 Goal Status: Active Interventions: Assess ulceration(s) every visit Treatment Activities: Skin care regimen initiated : 11/06/2016 Notes: Electronic Signature(s) Signed: 12/11/2016 5:00:18 PM By: Regan Lemming BSN, RN Entered By: Regan Lemming on 12/11/2016 08:44:08 Albor, Ambar V. ([id number]) -------------------------------------------------------------------------------- Pain Assessment Details Patient Name: Belote, Daphnee V. Date of Service: 12/11/2016 8:15 AM Medical Record Number: [id number] Patient Account Number: [id number] Date of Birth/Sex: [birth year] (76 y.o. Female) Treating RN: Afful, RN, BSN, Velva Harman Primary Care Tasheika Kitzmiller: Beverlyn Roux Other Clinician: Referring Cruzita Lipa: Beverlyn Roux Treating Aliveah Gallant/Extender: Tito Dine in Treatment: 5 Active Problems Location of Pain Severity and Description of Pain Patient Has Paino No Site Locations With Dressing Change: No Pain Management and Medication Current Pain Management: Electronic Signature(s) Signed: 12/11/2016 5:00:18 PM By: Regan Lemming BSN, RN Entered By: Regan Lemming on 12/11/2016 08:21:20 Nuckles, Cashe [name] ([id number]) -------------------------------------------------------------------------------- Patient/Caregiver Education Details Patient Name: Mizrahi, Deiona V. Date of Service: 12/11/2016 8:15 AM Medical Record Patient Account Number: [id number] Number: Treating RN: Baruch Gouty, RN, BSN, Velva Harman January 25, 1941 [phone] y.o. Other Clinician: Date of  Birth/Gender: Female) Treating ROBSON, [place] Primary Care Physician: Beverlyn Roux Physician/Extender: G Referring Physician: Dorthula Nettles in Treatment: 5 Education Assessment Education Provided To: Patient and Caregiver hubby Education Topics Provided Elevated Blood Sugar/ Impact on Healing: Methods: Explain/Verbal Responses: State content correctly Welcome To [facility] [place]: Methods: Explain/Verbal Responses: State content correctly Wound Debridement: Methods: Explain/Verbal Responses: State content correctly Wound/Skin Impairment: Methods: Explain/Verbal Responses: State content correctly Electronic Signature(s) Signed: 12/11/2016 5:00:18 PM By: Regan Lemming BSN, RN Entered By: Regan Lemming on 12/11/2016 09:02:59 Murdoch, Drue V. ([id number]) -------------------------------------------------------------------------------- Wound Assessment Details Patient Name: Godley, Natilie V. Date of Service: 12/11/2016 8:15 AM Medical Record Number: [id number] Patient Account Number: [id number] Date of Birth/Sex: October 08, 1940 (76 y.o. Female) Treating RN: Afful, RN, BSN, Administrator, sports Primary Care Breyer Tejera: Beverlyn Roux Other Clinician: Referring Raschelle Wisenbaker: Beverlyn Roux Treating Westlynn Fifer/Extender: Tito Dine in Treatment: 5 Wound Status Wound Number: 5 Primary Diabetic Wound/Ulcer of the Lower Etiology: Extremity Wound Location: Right Lower Leg - Distal Wound Open Wounding Event: Gradually Appeared Status: Date Acquired: 09/09/2016 Comorbid Cataracts, Chronic sinus Weeks Of Treatment: 5 History: problems/congestion, Congestive Heart Clustered Wound: No Failure, Hypertension, Type II Diabetes Photos Photo Uploaded By: Regan Lemming on 12/11/2016 17:32:29 Wound Measurements Length: (cm) 0.3 Width: (cm) 0.3 Depth: (cm) 0.1 Area: (cm) 0.071 Volume: (cm) 0.007 % Reduction in Area: 96% % Reduction in Volume: 96% Epithelialization: Large (67-100%) Tunneling:  No Undermining: No Wound Description Classification: Grade 1 Foul Odor Aft Wound Margin: Flat and Intact Slough/Fibrin Exudate Amount: Small Exudate Type: Serous Rilling, Ravyn V. ([id number]) er Cleansing: No o No Exudate Color: amber Wound Bed Granulation Amount: Large (67-100%) Exposed Structure Granulation Quality: Pink, Friable Fascia Exposed: No Necrotic Amount: None Present (0%) Fat Layer (Subcutaneous Tissue) Exposed: Yes Tendon Exposed: No Muscle Exposed: No Joint Exposed: No Bone Exposed: No Periwound Skin Texture Texture Color No Abnormalities Noted: No No Abnormalities Noted: No Callus: No Atrophie Blanche: No Crepitus: No Cyanosis: No Excoriation: No Ecchymosis: No Induration: No Erythema: No Rash: No Hemosiderin Staining: Yes Scarring: Yes Mottled: No Pallor: No Moisture Rubor: No No Abnormalities Noted: No Dry / Scaly: No  Temperature / Pain Maceration: Yes Temperature: No Abnormality Wound Preparation Ulcer Cleansing: Rinsed/Irrigated with Saline Topical Anesthetic Applied: None Treatment Notes Wound #5 (Right, Distal Lower Leg) 1. Cleansed with: Cleanse wound with antibacterial soap and water 3. Peri-wound Care: Moisturizing lotion 4. Dressing Applied: Prisma Ag 5. Secondary Dressing Applied Dry Gauze Kerlix/Conform 7. Secured with Recruitment consultant) Signed: 12/11/2016 5:00:18 PM By: Regan Lemming BSN, RN Entered By: Regan Lemming on 12/11/2016 08:36:19 Esau, HONESTIE KULIK ([id number]) Guerin, Beverlyann VMarland Kitchen ([id number]) -------------------------------------------------------------------------------- Wound Assessment Details Patient Name: Colborn, Neleh V. Date of Service: 12/11/2016 8:15 AM Medical Record Number: [id number] Patient Account Number: [id number] Date of Birth/Sex: 1940/10/09 (76 y.o. Female) Treating RN: Afful, RN, BSN, Administrator, sports Primary Care Hezzie Karim: Beverlyn Roux Other Clinician: Referring Sheridan Hew: Beverlyn Roux Treating  Dennie Moltz/Extender: Tito Dine in Treatment: 5 Wound Status Wound Number: 6 Primary Diabetic Wound/Ulcer of the Lower Etiology: Extremity Wound Location: Right Lower Leg - Proximal Wound Open Wounding Event: Gradually Appeared Status: Date Acquired: 09/09/2016 Comorbid Cataracts, Chronic sinus Weeks Of Treatment: 5 History: problems/congestion, Congestive Heart Clustered Wound: No Failure, Hypertension, Type II Diabetes Photos Photo Uploaded By: Regan Lemming on 12/11/2016 17:32:30 Wound Measurements Length: (cm) 1 Width: (cm) 0.3 Depth: (cm) 0.1 Area: (cm) 0.236 Volume: (cm) 0.024 % Reduction in Area: 24.8% % Reduction in Volume: 22.6% Epithelialization: Medium (34-66%) Tunneling: No Undermining: No Wound Description Classification: Grade 2 Wound Margin: Flat and Intact Exudate Amount: Small Exudate Type: Serous Pomales, Dimitria V. ([id number]) Foul Odor After Cleansing: No Slough/Fibrino Yes Exudate Color: amber Wound Bed Granulation Amount: None Present (0%) Exposed Structure Necrotic Amount: Large (67-100%) Fascia Exposed: No Necrotic Quality: Adherent Slough Fat Layer (Subcutaneous Tissue) Exposed: Yes Tendon Exposed: No Muscle Exposed: No Joint Exposed: No Bone Exposed: No Periwound Skin Texture Texture Color No Abnormalities Noted: No No Abnormalities Noted: No Callus: No Atrophie Blanche: No Crepitus: No Cyanosis: No Excoriation: No Ecchymosis: No Induration: No Erythema: No Rash: No Hemosiderin Staining: Yes Scarring: Yes Mottled: No Pallor: No Moisture Rubor: No No Abnormalities Noted: No Dry / Scaly: No Temperature / Pain Maceration: No Temperature: No Abnormality Tenderness on Palpation: Yes Wound Preparation Ulcer Cleansing: Rinsed/Irrigated with Saline Topical Anesthetic Applied: Other: lidocaine 4%, Electronic Signature(s) Signed: 12/11/2016 8:37:54 AM By: Regan Lemming BSN, RN Entered By: Regan Lemming on 12/11/2016  08:37:54 Boliver, Syndi V. ([id number]) -------------------------------------------------------------------------------- Wound Assessment Details Patient Name: Vargus, Zarya V. Date of Service: 12/11/2016 8:15 AM Medical Record Number: [id number] Patient Account Number: [id number] Date of Birth/Sex: Jan 10, 1941 (76 y.o. Female) Treating RN: Afful, RN, BSN, Administrator, sports Primary Care Adora Yeh: Beverlyn Roux Other Clinician: Referring Larren Copes: Beverlyn Roux Treating Sumayya Muha/Extender: Tito Dine in Treatment: 5 Wound Status Wound Number: 6 Primary Diabetic Wound/Ulcer of the Lower Etiology: Extremity Wound Location: Right Lower Leg - Proximal Wound Open Wounding Event: Gradually Appeared Status: Date Acquired: 09/09/2016 Comorbid Cataracts, Chronic sinus Weeks Of Treatment: 5 History: problems/congestion, Congestive Heart Clustered Wound: No Failure, Hypertension, Type II Diabetes Photos Photo Uploaded By: Regan Lemming on 12/11/2016 17:33:13 Wound Measurements Length: (cm) 1 Width: (cm) 0.3 Depth: (cm) 0.1 Area: (cm) 0.236 Volume: (cm) 0.024 % Reduction in Area: 24.8% % Reduction in Volume: 22.6% Epithelialization: Medium (34-66%) Tunneling: No Undermining: No Wound Description Classification: Grade 2 Foul Odor Aft Wound Margin: Flat and Intact Exudate Amount: Medium Exudate Type: Serosanguineous Steinhardt, Aireal V. ([id number]) er Cleansing: No Exudate Color: red, brown Wound Bed Granulation Amount: None Present (0%) Exposed Structure Necrotic Amount: Medium (34-66%) Fascia Exposed: No Necrotic Quality: Adherent  Slough Fat Layer (Subcutaneous Tissue) Exposed: Yes Tendon Exposed: No Muscle Exposed: No Joint Exposed: No Bone Exposed: No Periwound Skin Texture Texture Color No Abnormalities Noted: No No Abnormalities Noted: No Moisture Temperature / Pain No Abnormalities Noted: No Temperature: No Abnormality Wound Preparation Ulcer Cleansing: Rinsed/Irrigated with  Saline Topical Anesthetic Applied: Other: lidocaine 4%, Treatment Notes Wound #6 (Right, Proximal Lower Leg) 1. Cleansed with: Cleanse wound with antibacterial soap and water 3. Peri-wound Care: Moisturizing lotion 4. Dressing Applied: Prisma Ag 5. Secondary Dressing Applied Dry Gauze Kerlix/Conform 7. Secured with Recruitment consultant) Signed: 12/11/2016 5:00:18 PM By: Regan Lemming BSN, RN Entered By: Regan Lemming on 12/11/2016 08:41:19 Petitfrere, Maciah VMarland Kitchen ([id number]) -------------------------------------------------------------------------------- Wound Assessment Details Patient Name: Hemenway, Takeyla V. Date of Service: 12/11/2016 8:15 AM Medical Record Number: [id number] Patient Account Number: [id number] Date of Birth/Sex: 10-20-1940 (76 y.o. 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, Hypertension, Type II Diabetes Clustered Wound: No Photos Photo Uploaded By: Regan Lemming on 12/11/2016 17:33:14 Wound Measurements Length: (cm) 2.4 Width: (cm) 0.8 Depth: (cm) 0.2 Area: (cm) 1.508 Volume: (cm) 0.302 % Reduction in Area: -28% % Reduction in Volume: 14.4% Epithelialization: None Tunneling: No Undermining: No Wound Description Classification: Grade 2 Wound Margin: Flat and Intact Exudate Amount: Large Exudate Type: Serous Exudate Color: amber Foul Odor After Cleansing: No Slough/Fibrino Yes Wound Bed Granulation Amount: None Present (0%) Exposed Structure Necrotic Amount: Large  (67-100%) Fascia Exposed: No Necrotic Quality: Adherent Slough Fat Layer (Subcutaneous Tissue) Exposed: Yes Ricotta, Merve V. ([id number]) Tendon Exposed: No Muscle Exposed: No Joint Exposed: No Bone Exposed: No Periwound Skin Texture Texture Color No Abnormalities Noted: No No Abnormalities Noted: No Induration: Yes Ecchymosis: Yes Scarring: Yes Hemosiderin Staining: Yes Mottled: Yes Moisture No Abnormalities Noted: No Temperature / Pain Maceration: Yes Temperature: No Abnormality Tenderness on Palpation: Yes Wound Preparation Ulcer Cleansing: Rinsed/Irrigated with Saline Topical Anesthetic Applied: Other: lidociane 4%, Treatment Notes Wound #7 (Right, Dorsal Metatarsal head first) 1. Cleansed with: Cleanse wound with antibacterial soap and water 3. Peri-wound Care: Moisturizing lotion 4. Dressing Applied: Prisma Ag 5. Secondary Dressing Applied Dry Gauze Kerlix/Conform 7. Secured with Recruitment consultant) Signed: 12/11/2016 5:00:18 PM By: Regan Lemming BSN, RN Entered By: Regan Lemming on 12/11/2016 08:41:57 [name], [name] ([id number]) -------------------------------------------------------------------------------- Wound Assessment Details Patient Name: Folks, Quintana V. Date of Service: 12/11/2016 8:15 AM Medical Record Number: [id number] Patient Account Number: [id number] Date of Birth/Sex: 1941-07-10 (76 y.o. Female) Treating RN: Afful, RN, BSN, Administrator, sports Primary Care Fatumata Kashani: Beverlyn Roux Other Clinician: Referring Demeco Ducksworth: Beverlyn Roux Treating Dempsey Knotek/Extender: Tito Dine in Treatment: 5 Wound Status Wound Number: 8 Primary Diabetic Wound/Ulcer of the Lower Etiology: Extremity Wound Location: Right Toe Third - Dorsal Wound Open Wounding Event: Gradually Appeared Status: Date Acquired: 10/28/2016 Comorbid Cataracts, Chronic sinus Weeks Of Treatment: 5 History: problems/congestion, Congestive Heart Clustered Wound: No Failure, Hypertension,  Type II Diabetes Photos Photo Uploaded By: Regan Lemming on 12/11/2016 17:33:52 Wound Measurements Length: (cm) 0.4 Width: (cm) 0.5 Depth: (cm) 0.1 Area: (cm) 0.157 Volume: (cm) 0.016 % Reduction in Area: 79.2% % Reduction in Volume: 78.7% Epithelialization: None Tunneling: No Undermining:  No Wound Description Classification: Grade 1 Wound Margin: Flat and Intact Exudate Amount: Large Exudate Type: Serous Exudate Color: amber Foul Odor After Cleansing: No Slough/Fibrino Yes Wound Bed Granulation Amount: Medium (34-66%) Exposed Structure Granulation Quality: Pink, Pale Fascia Exposed: No Necrotic Amount: Small (1-33%) Fat Layer (Subcutaneous Tissue) Exposed: Yes Necrotic Quality: Adherent Slough Tendon Exposed: No Macknight, Bobbie V. ([id number]) Muscle Exposed: No Joint Exposed: No Bone Exposed: No Periwound Skin Texture Texture Color No Abnormalities Noted: No No Abnormalities Noted: No Callus: No Atrophie Blanche: No Crepitus: No Cyanosis: No Excoriation: No Ecchymosis: No Induration: No Erythema: No Rash: No Hemosiderin Staining: No Scarring: Yes Mottled: Yes Pallor: No Moisture Rubor: No No Abnormalities Noted: No Dry / Scaly: No Temperature / Pain Maceration: No Temperature: No Abnormality Tenderness on Palpation: Yes Wound Preparation Ulcer Cleansing: Rinsed/Irrigated with Saline Topical Anesthetic Applied: Other: lidocaine 4%, Treatment Notes Wound #8 (Right, Dorsal Toe Third) 1. Cleansed with: Cleanse wound with antibacterial soap and water 3. Peri-wound Care: Moisturizing lotion 4. Dressing Applied: Prisma Ag 5. Secondary Dressing Applied Dry Gauze Kerlix/Conform 7. Secured with Recruitment consultant) Signed: 12/11/2016 5:00:18 PM By: Regan Lemming BSN, RN Entered By: Regan Lemming on 12/11/2016 08:42:55 Anstine, Janese V. ([id number]) -------------------------------------------------------------------------------- Wound Assessment  Details Patient Name: Youkhana, Monty V. Date of Service: 12/11/2016 8:15 AM Medical Record Number: [id number] Patient Account Number: [id number] Date of Birth/Sex: 24-Mar-1941 (76 y.o. Female) Treating RN: Afful, RN, BSN, [employer] Primary Care Xhaiden Coombs: Beverlyn Roux Other Clinician: Referring Gardiner Espana: Beverlyn Roux Treating Kaeleigh Westendorf/Extender: Tito Dine in Treatment: 5 Wound Status Wound Number: 9 Primary Diabetic Wound/Ulcer of the Lower Etiology: Extremity Wound Location: Right Foot - Lateral, Distal Wound Open Wounding Event: Gradually Appeared Status: Date Acquired: 10/14/2016 Comorbid Cataracts, Chronic sinus Weeks Of Treatment: 5 History: problems/congestion, Congestive Heart Clustered Wound: No Failure, Hypertension, Type II Diabetes Photos Photo Uploaded By: Regan Lemming on 12/11/2016 17:33:53 Wound Measurements Length: (cm) 2.7 Width: (cm) 0.4 Depth: (cm) 0.1 Area: (cm) 0.848 Volume: (cm) 0.085 % Reduction in Area: 13.6% % Reduction in Volume: 56.6% Epithelialization: Small (1-33%) Tunneling: No Undermining: No Wound Description Classification: Grade 1 Wound Margin: Indistinct, nonvisible Exudate Amount: Medium Exudate Type: Serous Exudate Color: amber Foul Odor After Cleansing: No Slough/Fibrino Yes Wound Bed Granulation Amount: Small (1-33%) Exposed Structure Granulation Quality: Pink, Pale Fascia Exposed: No Necrotic Amount: Medium (34-66%) Fat Layer (Subcutaneous Tissue) Exposed: Yes Necrotic Quality: Adherent Slough Tendon Exposed: No Goodenow, Shauntell V. ([id number]) Muscle Exposed: No Joint Exposed: No Bone Exposed: No Periwound Skin Texture Texture Color No Abnormalities Noted: No No Abnormalities Noted: No Callus: No Atrophie Blanche: No Crepitus: No Cyanosis: No Excoriation: No Ecchymosis: No Induration: No Erythema: No Rash: No Hemosiderin Staining: Yes Scarring: No Mottled: No Pallor: No Moisture Rubor: No No Abnormalities  Noted: No Dry / Scaly: No Temperature / Pain Maceration: Yes Temperature: No Abnormality Tenderness on Palpation: Yes Wound Preparation Ulcer Cleansing: Rinsed/Irrigated with Saline Topical Anesthetic Applied: Other: lidocaine 4%, Treatment Notes Wound #9 (Right, Distal, Lateral Foot) 1. Cleansed with: Cleanse wound with antibacterial soap and water 3. Peri-wound Care: Moisturizing lotion 4. Dressing Applied: Prisma Ag 5. Secondary Dressing Applied Dry Gauze Kerlix/Conform 7. Secured with Recruitment consultant) Signed: 12/11/2016 5:00:18 PM By: Regan Lemming BSN, RN Entered By: Regan Lemming on 12/11/2016 08:43:55 Zelaya, Colby [name] ([id number]) -------------------------------------------------------------------------------- Vitals Details Patient Name: Strebel, Chelbi V. Date of Service: 12/11/2016 8:15 AM Medical Record Number: [id number] Patient Account Number: [id number] Date of Birth/Sex: [birth year] (76 y.o. Female) Treating RN:  Afful, RN, BSN, Velva Harman Primary Care Kahliya Fraleigh: Beverlyn Roux Other Clinician: Referring Kimimila Tauzin: Beverlyn Roux Treating Fortunato Nordin/Extender: Tito Dine in Treatment: 5 Vital Signs Time Taken: 08:23 Temperature (F): 97.8 Height (in): 64 Pulse (bpm): 70 Weight (lbs): 200 Respiratory Rate (breaths/min): 17 Body Mass Index (BMI): 34.3 Blood Pressure (mmHg): 119/53 Reference Range: 80 - 120 mg / dl Electronic Signature(s) Signed: 12/11/2016 5:00:18 PM By: Regan Lemming BSN, RN Entered By: Regan Lemming on 12/11/2016 08:25:52

## 2016-12-13 NOTE — Progress Notes (Signed)
[name] ([id number]) Visit Report for 12/11/2016 Chief Complaint Document Details Patient Name: White, [name] Date of Service: 12/11/2016 8:15 AM Medical Record Patient Account Number: [id number] Number: Treating RN: Baruch Gouty, RN, BSN, Velva Harman 07-24-1941 [phone] y.o. Other Clinician: Date of Birth/Sex: Female) Treating Nyela Cortinas Primary Care Provider: Beverlyn Roux Provider/Extender: G Referring Provider: Dorthula Nettles in Treatment: 5 Information Obtained from: Patient Chief Complaint The patient is here for initial evaluation of RLE and Right foot/toe ulcers Electronic Signature(s) Signed: 12/11/2016 5:27:22 PM By: Linton Ham MD Entered By: Linton Ham on 12/11/2016 08:55:15 [name] ([id number]) -------------------------------------------------------------------------------- Debridement Details Patient Name: White, [name] Date of Service: 12/11/2016 8:15 AM Medical Record Patient Account Number: [id number] Number: Treating RN: Baruch Gouty, RN, BSN, Velva Harman 03-20-1941 [phone] y.o. Other Clinician: Date of Birth/Sex: Female) Treating Katherleen Folkes Primary Care Provider: Beverlyn Roux Provider/Extender: G Referring Provider: Dorthula Nettles in Treatment: 5 Debridement Performed for Wound #7 Right,Dorsal Metatarsal head first Assessment: Performed By: Physician Ricard Dillon, MD Debridement: Debridement Pre-procedure Yes - 08:40 Verification/Time Out Taken: Start Time: 08:40 Pain Control: Lidocaine 4% Topical Solution Level: Skin/Subcutaneous Tissue Total Area Debrided (L x 2.4 (cm) x 0.8 (cm) = 1.92 (cm) W): Tissue and other Non-Viable, Exudate, Fat, Fibrin/Slough, Subcutaneous material debrided: Instrument: Curette Bleeding: Minimum Hemostasis Achieved: Pressure End Time: 08:44 Procedural Pain: 0 Post Procedural Pain: 0 Response to Treatment: Procedure was tolerated well Post Debridement Measurements of Total Wound Length: (cm)  2.4 Width: (cm) 0.8 Depth: (cm) 0.2 Volume: (cm) 0.302 Character of Wound/Ulcer Post Requires Further Debridement Debridement: Severity of Tissue Post Debridement: Fat layer exposed Post Procedure Diagnosis Same as Pre-procedure Electronic Signature(s) Signed: 12/11/2016 5:00:18 PM By: Regan Lemming BSN, RN Signed: 12/11/2016 5:27:22 PM By: Linton Ham MD [name], [name] ([id number]) Entered By: Linton Ham on 12/11/2016 08:55:05 [name], [name] ([id number]) -------------------------------------------------------------------------------- HPI Details Patient Name: [name], [name] Date of Service: 12/11/2016 8:15 AM Medical Record Patient Account Number: [id number] Number: Treating RN: Baruch Gouty, RN, BSN, Velva Harman 31-Dec-1940 [phone] y.o. Other Clinician: Date of Birth/Sex: Female) Treating Teonna Coonan Primary Care Provider: Beverlyn Roux Provider/Extender: G Referring Provider: Dorthula Nettles in Treatment: 5 History of Present Illness HPI Description: 08/13/16: This is a 76 year old woman who came predominantly for review of 3 cm in diameter circular wound to the left anterior lateral leg. She was in the ER on 08/01/16 I reviewed their notes. There was apparently pus coming out of the wound at that time and the patient arrived requesting debridement which they don't do in the emergency room. Nevertheless I can't see that they did any x-rays. There were no cultures done. She is a type II diabetic and I a note after the patient was in the clinic that she had a bypass graft from the popliteal to the tibial on the right on 02/28/16. She also had a right greater saphenous vein harvest on the same date for arterial bypass. She is going to have vascular studies including ABIs T ABIs on the right on 08/28/16. The patient's surgery was on 02/28/16 by Dr. Vallarie Mare she had a right below the knee popliteal artery to peroneal artery bypass with reverse greater saphenous vein and an endarterectomy  of the mid segment peroneal artery. Postoperatively she had a strong mild monophasic peroneal signal with a pink foot. It would appear that the patient is had some nonhealing in the surgical saphenous vein harvest site on the left leg. Surprisingly looking [facility] I cannot  see much information about this at all. Dr. Lucious Groves notes from 05/29/16 show that the patient's wounds "are not healed" the right first metatarsal wound healed but then opened back up. 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 At that point the patient had a wound VAC to her right calf with regards to the wounds on her dorsal right toe would appear that these are felt to be arterial wounds. She has had surgery on the metatarsal phalangeal in 2015 I Dr. Doran Durand secondary to a right metatarsal phalangeal joint fracture. She is apparently had discoloration around this area since then. 08/28/16; patient arrives with her wounds in much the same condition. The linear vein harvest site and the circular wound below it which I think was a blister. She also has to probing holes in her right great toe and a necrotic eschar on the right second toe. Because of these being arterial wounds I reduced her compression from 3-2 layers this seems to of done satisfactorily she has not had any problems. I cannot see that she is actually had an x-ray ====== 11/06/16 the patient comes in for evaluation of her right lower extremity ulcers. She was here in January 2018 for 2 visits subsequently ended up in the hospital with pneumonia and then to rehabilitation. She has now been discharged from rehabilitation and is home. She has multiple ulcerations to her right lower extremity including the foot and toes. She does have home health in place and they have been placing alginate to the ulcers. She is followed by Dr. Bridgett Larsson of vascular  medicine. 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 She recently saw him on 3/23. In office ABIs were: Leaton, COLEY LITTLES. ([id number]) Right 0.48 with monophasic flow to the DP, PT, peroneal Left 0.63 with monophasic flow to the DP and PT Her arterial studies indicated a patent right below knee popliteal to peroneal bypass She had an MRI in February 2008 that was negative frosty myelitis but this showed general soft tissue edema in the right foot and lower extremity concerning for cellulitis She is a diabetic, managed with insulin. Her hemoglobin A1c in December 2017 was 8.4 which is a trend up from previous levels. She had blood work in February 2018 which revealed an albumin of 2.6 this appears to be relatively acute as an albumin in November 2017 was 3.7 11/13/16; this is a patient I have not seen since February who is readmitted to our clinic last week. 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 Bridgett Larsson. I have reviewed Dr. Lianne Moris notes from March/23/18. Doppler ABI on that date showed an ABI on the right of 0.48 and on the left of 0.63. Dorsalis pedis waveforms were monophasic bilaterally. There was no waveforms detected at the posterior tibial on the right, monophasic on the left. Dr. Lianne Moris comments were that this patient would have follow-up vascular studies in 3 months including ABIs and right lower extremity arterial duplex. She had an MRI in February that was negative for osteomyelitis but showed generalized edema in the foot. Last albumin I see was in January at 3.4 we have been using Santyl to the 4 wounds on the right leg the patient is noted today to have widespread edema well up towards her groin this  is pitting 2-3+. 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 Normal ejection  fraction. 11/20/16; patient has a follow-up appointment with Dr. Bridgett Larsson on April 23. She is still complaining of a lot of pain in the right foot and right leg. It is not clear to me that this is at all positional however I think it is clear claudication with minimal activity perhaps at rest. At our suggestion she is return to her primary physician's office tomorrow with regards to her pitting lower extremity bilateral edema that I reviewed in detail last week 11/27/16; the follow-up with Dr. Bridgett Larsson was actually on May 23 on April 23 as I stated in my note last week. N/A case all of her wounds seems somewhat smaller. The 2 on the right leg are definitely smaller. The areas on the dorsal right first toe, right third toe and the lateral part of the right fifth metatarsal head all looks smaller but have tightly adherent surfaces. We have been using Santyl 12/04/16; follow-up with Dr. Bridgett Larsson on May 23. 2 small open wounds on the right leg continue to get smaller. The area on the right third total lateral aspect of the right fifth toe also look better. The remaining area on the dorsal first toe still has some depth to it. We have been using Santyl to the toes and collagen on the right 12/11/16; according to patient's husband the follow-up with Dr. Bridgett Larsson is not until July. All of the wounds on the right leg are measuring smaller. We have been using some combination of Prisma and Santyl although I think we can go to straight Prisma today. There may have been some confusion with home health about the primary dressing orders here. Electronic Signature(s) Signed: 12/11/2016 5:27:22 PM By: Linton Ham MD Entered By: Linton Ham on 12/11/2016 08:56:03 Friese, [name] ([id number]) -------------------------------------------------------------------------------- Physical Exam Details Patient Name: White, [name] Date of Service: 12/11/2016 8:15 AM Medical Record Patient Account Number:  [id number] Number: Treating RN: Baruch Gouty, RN, BSN, Velva Harman [birth year] [phone] y.o. Other Clinician: Date of Birth/Sex: Female) Treating Dacen Frayre Primary Care Provider: Beverlyn Roux Provider/Extender: G Referring Provider: Dorthula Nettles in Treatment: 5 Constitutional Sitting or standing Blood Pressure is within target range for patient.. Pulse regular and within target range for patient.[name] Respirations regular, non-labored and within target range.. Temperature is normal and within the target range for the patient.. Patient's appearance is neat and clean. Appears in no acute distress. Well nourished and well developed.. Notes Wound exam; 2 small open areas on the right leg are very tiny and look as though they are on their way to healing. She has 3 wounds on the right foot including the base of the right first toe dorsally, the dorsal aspect of the right third toe, and the lateral aspect of the right fifth metatarsal head. All of these appear to be better and measuring smaller. However the remaining open area on the dorsal aspect of the right great toe is a deep wound. Covered in necrotic material which I removed with a #3 curet. No bleeding. This probes to bone Electronic Signature(s) Signed: 12/11/2016 5:27:22 PM By: Linton Ham MD Entered By: Linton Ham on 12/11/2016 08:57:26 Dizdarevic, [name] ([id number]) -------------------------------------------------------------------------------- Physician Orders Details Patient Name: White, [name] Date of Service: 12/11/2016 8:15 AM Medical Record Patient Account Number: [id number] Number: Treating RN: Baruch Gouty, RN, BSN, Velva Harman [birth year] [phone] y.o. Other Clinician: Date of Birth/Sex: Female) Treating Marshell Rieger  Primary Care Provider: Beverlyn Roux Provider/Extender: G Referring Provider: Dorthula Nettles in Treatment: 5 Verbal / Phone Orders: No Diagnosis Coding Wound Cleansing Wound #5 Right,Distal Lower  Leg o Clean wound with Normal Saline. o Cleanse wound with mild soap and water Wound #6 Right,Proximal Lower Leg o Clean wound with Normal Saline. o Cleanse wound with mild soap and water Wound #7 Right,Dorsal Metatarsal head first o Clean wound with Normal Saline. o Cleanse wound with mild soap and water Wound #8 Right,Dorsal Toe Third o Clean wound with Normal Saline. o Cleanse wound with mild soap and water Wound #9 Right,Distal,Lateral Foot o Clean wound with Normal Saline. o Cleanse wound with mild soap and water Anesthetic Wound #5 Right,Distal Lower Leg o Topical Lidocaine 4% cream applied to wound bed prior to debridement Wound #6 Right,Proximal Lower Leg o Topical Lidocaine 4% cream applied to wound bed prior to debridement Wound #7 Right,Dorsal Metatarsal head first o Topical Lidocaine 4% cream applied to wound bed prior to debridement Wound #8 Right,Dorsal Toe Third o Topical Lidocaine 4% cream applied to wound bed prior to debridement Wound #9 Right,Distal,Lateral Foot o Topical Lidocaine 4% cream applied to wound bed prior to debridement White, [name] ([id number]) Primary Wound Dressing Wound #5 Right,Distal Lower Leg o Prisma Ag Wound #6 Right,Proximal Lower Leg o Prisma Ag Wound #7 Right,Dorsal Metatarsal head first o Prisma Ag Wound #8 Right,Dorsal Toe Third o Prisma Ag Wound #9 Right,Distal,Lateral Foot o Prisma Ag Secondary Dressing Wound #5 Right,Distal Lower Leg o Conform/Kerlix Wound #6 Right,Proximal Lower Leg o Conform/Kerlix Wound #7 Right,Dorsal Metatarsal head first o Dry Gauze o Conform/Kerlix Wound #8 Right,Dorsal Toe Third o Dry Gauze o Conform/Kerlix Wound #9 Right,Distal,Lateral Foot o Dry Gauze o Conform/Kerlix Dressing Change Frequency Wound #5 Right,Distal Lower Leg o Three times weekly Wound #6 Right,Proximal Lower Leg o Three times weekly Wound #7 Right,Dorsal  Metatarsal head first o Change dressing every day. Wound #8 Right,Dorsal Toe Third o Change dressing every day. White, [name] ([id number]) Wound #9 Right,Distal,Lateral Foot o Change dressing every day. Follow-up Appointments Wound #5 Right,Distal Lower Leg o Return Appointment in 2 weeks. - has appt with heart doctor Wound #6 Right,Proximal Lower Leg o Return Appointment in 2 weeks. - has appt with heart doctor Wound #7 Right,Dorsal Metatarsal head first o Return Appointment in 2 weeks. - has appt with heart doctor Wound #8 Right,Dorsal Toe Third o Return Appointment in 2 weeks. - has appt with heart doctor Wound #9 Right,Distal,Lateral Foot o Return Appointment in 2 weeks. - has appt with heart doctor Edema Control Wound #5 Right,Distal Lower Leg o Elevate legs to the level of the heart and pump ankles as often as possible Wound #6 Right,Proximal Lower Leg o Elevate legs to the level of the heart and pump ankles as often as possible Wound #7 Right,Dorsal Metatarsal head first o Elevate legs to the level of the heart and pump ankles as often as possible Wound #8 Right,Dorsal Toe Third o Elevate legs to the level of the heart and pump ankles as often as possible Wound #9 Right,Distal,Lateral Foot o Elevate legs to the level of the heart and pump ankles as often as possible Home Health Wound #5 Right,Distal Lower Leg o [place] [facility] twice weekly, [facility]-Wednesday, teach family member to change on off day. o Home Health Nurse may visit PRN to address patientos wound care needs. o FACE TO FACE ENCOUNTER: MEDICARE and MEDICAID PATIENTS: I certify  that this patient is under my care and that I had a face-to-face encounter that meets the physician face-to-face encounter requirements with this patient on this date. The encounter with the patient was in whole or in part for the following MEDICAL CONDITION: (primary  reason for [place]) MEDICAL NECESSITY: I certify, that based on my findings, NURSING services are a medically necessary home health service. HOME BOUND STATUS: I certify that my clinical findings support that this patient is homebound (i.e., Due to illness or injury, pt requires aid of Postema, Charnelle V. ([id number]) supportive devices such as crutches, cane, wheelchairs, walkers, the use of special transportation or the assistance of another person to leave their place of residence. There is a normal inability to leave the home and doing so requires considerable and taxing effort. Other absences are for medical reasons / religious services and are infrequent or of short duration when for other reasons). o If current dressing causes regression in wound condition, may D/C ordered dressing product/s and apply Normal Saline Moist Dressing daily until next [place] / Other MD appointment. [place] of regression in wound condition at [phone]. o Please direct any NON-WOUND related issues/requests for orders to patient's Primary Care Physician Wound #6 Right,Proximal Lower Leg o [place] Visits - [facility] twice weekly, [facility]-Wednesday, teach family member to change on off day. o Home Health Nurse may visit PRN to address patientos wound care needs. o FACE TO FACE ENCOUNTER: MEDICARE and MEDICAID PATIENTS: I certify that this patient is under my care and that I had a face-to-face encounter that meets the physician face-to-face encounter requirements with this patient on this date. The encounter with the patient was in whole or in part for the following MEDICAL CONDITION: (primary reason for [place]) MEDICAL NECESSITY: I certify, that based on my findings, NURSING services are a medically necessary home health service. HOME BOUND STATUS: I certify that my clinical findings support that this patient is homebound (i.e.,  Due to illness or injury, pt requires aid of supportive devices such as crutches, cane, wheelchairs, walkers, the use of special transportation or the assistance of another person to leave their place of residence. There is a normal inability to leave the home and doing so requires considerable and taxing effort. Other absences are for medical reasons / religious services and are infrequent or of short duration when for other reasons). o If current dressing causes regression in wound condition, may D/C ordered dressing product/s and apply Normal Saline Moist Dressing daily until next [place] / Other MD appointment. [place] of regression in wound condition at [phone]. o Please direct any NON-WOUND related issues/requests for orders to patient's Primary Care Physician Wound #7 Right,Dorsal Metatarsal head first o [place] Visits - [facility] twice weekly, [facility] Care [facility]-Wednesday, teach family member to change on off day. o Home Health Nurse may visit PRN to address patientos wound care needs. o FACE TO FACE ENCOUNTER: MEDICARE and MEDICAID PATIENTS: I certify that this patient is under my care and that I had a face-to-face encounter that meets the physician face-to-face encounter requirements with this patient on this date. The encounter with the patient was in whole or in part for the following MEDICAL CONDITION: (primary reason for [place]) MEDICAL NECESSITY: I certify, that based on my findings, NURSING services are a medically necessary home health service. HOME BOUND STATUS: I certify that my clinical findings support that this patient  is homebound (i.e., Due to illness or injury, pt requires aid of supportive devices such as crutches, cane, wheelchairs, walkers, the use of special transportation or the assistance of another person to leave their place of residence. There is a normal inability to leave the home and  doing so requires considerable and taxing effort. Other absences are for medical reasons / religious services and are infrequent or of short duration when for other reasons). [place], Deserai V. ([id number]) o If current dressing causes regression in wound condition, may D/C ordered dressing product/s and apply Normal Saline Moist Dressing daily until next [place] / Other MD appointment. [place] of regression in wound condition at [phone]. o Please direct any NON-WOUND related issues/requests for orders to patient's Primary Care Physician Wound #8 Right,Dorsal Toe [place] Visits - [facility] twice weekly, W[facility] Care Center-Wednesday, teach family member to change on off day. o Home Health Nurse may visit PRN to address patientos wound care needs. o FACE TO FACE ENCOUNTER: MEDICARE and MEDICAID PATIENTS: I certify that this patient is under my care and that I had a face-to-face encounter that meets the physician face-to-face encounter requirements with this patient on this date. The encounter with the patient was in whole or in part for the following MEDICAL CONDITION: (primary reason for [place]) MEDICAL NECESSITY: I certify, that based on my findings, NURSING services are a medically necessary home health service. HOME BOUND STATUS: I certify that my clinical findings support that this patient is homebound (i.e., Due to illness or injury, pt requires aid of supportive devices such as crutches, cane, wheelchairs, walkers, the use of special transportation or the assistance of another person to leave their place of residence. There is a normal inability to leave the home and doing so requires considerable and taxing effort. Other absences are for medical reasons / religious services and are infrequent or of short duration when for other reasons). o If current dressing causes regression in wound condition, may D/C ordered  dressing product/s and apply Normal Saline Moist Dressing daily until next [place] / Other MD appointment. [place] of regression in wound condition at [phone]. o Please direct any NON-WOUND related issues/requests for orders to patient's Primary Care Physician Wound #9 [place] [facility] - [facility] twice weekly, [facility]-Wednesday, teach family member to change on off day. o Home Health Nurse may visit PRN to address patientos wound care needs. o FACE TO FACE ENCOUNTER: MEDICARE and MEDICAID PATIENTS: I certify that this patient is under my care and that I had a face-to-face encounter that meets the physician face-to-face encounter requirements with this patient on this date. The encounter with the patient was in whole or in part for the following MEDICAL CONDITION: (primary reason for [place]) MEDICAL NECESSITY: I certify, that based on my findings, NURSING services are a medically necessary home health service. HOME BOUND STATUS: I certify that my clinical findings support that this patient is homebound (i.e., Due to illness or injury, pt requires aid of supportive devices such as crutches, cane, wheelchairs, walkers, the use of special transportation or the assistance of another person to leave their place of residence. There is a normal inability to leave the home and doing so requires considerable and taxing effort. Other absences are for medical reasons / religious services and are infrequent or of short duration when for other reasons). o If current dressing causes regression in  wound condition, may D/C ordered dressing product/s and apply Normal Saline Moist Dressing daily until next [place] / Other MD appointment. [place] of regression in wound condition at [phone]. o Please direct any NON-WOUND related issues/requests for orders to patient's  Primary Care Physician ROBERTTA, HALFHILL ([id number]) Electronic Signature(s) Signed: 12/11/2016 5:00:18 PM By: Regan Lemming BSN, RN Signed: 12/11/2016 5:27:22 PM By: Linton Ham MD Entered By: Regan Lemming on 12/11/2016 08:47:28 [place], Meron [name] ([id number]) -------------------------------------------------------------------------------- Problem List Details Patient Name: Daye, [name] Date of Service: 12/11/2016 8:15 AM Medical Record Patient Account Number: [id number] Number: Treating RN: Baruch Gouty, RN, BSN, Velva Harman 02-Oct-1940 [phone] y.o. 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 ([id number]) -------------------------------------------------------------------------------- Progress Note Details Patient Name: Kost, Deissy V. Date of Service: 12/11/2016 8:15 AM Medical Record Patient Account Number: [id number] Number: Treating RN: Baruch Gouty, RN, BSN,  Velva Harman August 24, 1940 [phone] y.o. Other Clinician: Date of Birth/Sex: Female) Treating Nola Botkins Primary Care Provider: Beverlyn Roux Provider/Extender: G Referring Provider: Dorthula Nettles in Treatment: 5 Subjective Chief Complaint Information obtained from Patient The patient is here for initial evaluation of RLE and Right foot/toe ulcers History of Present Illness (HPI) 08/13/16: This is a 76 year old woman who came predominantly for review of 3 cm in diameter circular wound to the left anterior lateral leg. She was in the ER on 08/01/16 I reviewed their notes. There was apparently pus coming out of the wound at that time and the patient arrived requesting debridement which they don't do in the emergency room. Nevertheless I can't see that they did any x-rays. There were no cultures done. She is a type II diabetic and I a note after the patient was in the clinic that she had a bypass graft from the popliteal to the tibial on the right on 02/28/16. She also had a right greater saphenous vein harvest on the same date for arterial bypass. She is going to have vascular studies including ABIs T ABIs on the right on 08/28/16. The patient's surgery was on 02/28/16 by Dr. Vallarie Mare she had a right below the knee popliteal artery to peroneal artery bypass with reverse greater saphenous vein and an endarterectomy of the mid segment peroneal artery. Postoperatively she had a strong mild monophasic peroneal signal with a pink foot. It would appear that the patient is had some nonhealing in the surgical saphenous vein harvest site on the left leg. Surprisingly looking through [facility] I cannot see much information about this at all. Dr. Lucious Groves notes from 05/29/16 show that the patient's wounds "are not healed" the right first metatarsal wound healed but then opened back up.  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 At that point the patient had a wound VAC to her right calf with regards to the wounds on her dorsal right toe would appear that these are felt to be arterial wounds. She has had surgery on the metatarsal phalangeal in 2015 I Dr. Doran Durand secondary to a right metatarsal phalangeal joint fracture. She is apparently had discoloration around this area since then. 08/28/16; patient arrives with her wounds in much the same condition. The linear vein harvest site and the circular wound below it which I think was a blister. She also has to probing holes in her right great toe and a necrotic eschar on the right second toe. Because of these being arterial wounds I reduced her compression from 3-2 layers this seems to of done satisfactorily she has not had any problems. I cannot see that she is actually had an x-ray ====== 11/06/16 the patient comes in for evaluation of her right lower extremity ulcers. She was here in January 2018 for 2 visits subsequently ended up in the hospital with pneumonia and then to rehabilitation. She has now been [place], [place] ([id number]) discharged from rehabilitation and is home. She has multiple ulcerations to her right lower extremity including the foot and toes. She does have home health in place and they have been placing alginate to the ulcers. She is followed by Dr. Bridgett Larsson of vascular medicine. 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 She recently saw him on 3/23. In office ABIs were: Right 0.48 with monophasic flow to the DP, PT, peroneal Left 0.63 with monophasic flow to the DP and PT Her arterial studies indicated a patent right below knee popliteal to peroneal bypass She had an MRI in February 2008 that was negative frosty myelitis but this showed general soft tissue edema in the right foot and lower extremity concerning for cellulitis She is a diabetic, managed with insulin.  Her hemoglobin A1c in December 2017 was 8.4 which is a trend up from previous levels. She had blood work in February 2018 which revealed an albumin of 2.6 this appears to be relatively acute as an albumin in November 2017 was 3.7 11/13/16; this is a patient I have not seen since February who is readmitted to our clinic last week. 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 Bridgett Larsson. I have reviewed Dr. Lianne Moris notes from March/23/18. Doppler ABI on that date showed an ABI on the right of 0.48 and on the left of 0.63. Dorsalis pedis waveforms were monophasic bilaterally. There was no waveforms detected at the posterior tibial on the right, monophasic on the left. Dr. Lianne Moris comments were that this patient would have follow-up vascular studies in 3 months including ABIs and right lower extremity arterial duplex. She had an MRI in February that was negative for osteomyelitis but showed generalized edema in the foot. Last albumin I see was in January at 3.4 we have been using Santyl to the 4 wounds on the right leg the patient is noted today to have widespread edema well up towards her groin this is pitting 2-3+. 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 Normal ejection fraction. 11/20/16; patient has a follow-up appointment with Dr. Bridgett Larsson  on April 23. She is still complaining of a lot of pain in the right foot and right leg. It is not clear to me that this is at all positional however I think it is clear claudication with minimal activity perhaps at rest. At our suggestion she is return to her primary physician's office tomorrow with regards to her pitting lower extremity bilateral edema that I reviewed in detail last week 11/27/16; the follow-up with Dr. Bridgett Larsson was actually on May 23 on April 23 as I stated in my note last week. N/A case all of her wounds seems somewhat smaller. The  2 on the right leg are definitely smaller. The areas on the dorsal right first toe, right third toe and the lateral part of the right fifth metatarsal head all looks smaller but have tightly adherent surfaces. We have been using Santyl 12/04/16; follow-up with Dr. Bridgett Larsson on May 23. 2 small open wounds on the right leg continue to get smaller. The area on the right third total lateral aspect of the right fifth toe also look better. The remaining area on the dorsal first toe still has some depth to it. We have been using Santyl to the toes and collagen on the right 12/11/16; according to patient's husband the follow-up with Dr. Bridgett Larsson is not until July. All of the wounds on the right leg are measuring smaller. We have been using some combination of Prisma and Santyl although I think we can go to straight Prisma today. There may have been some confusion with home health about the primary dressing orders here. Objective Lipuma, Colby V. ([id number]) Constitutional Sitting or standing Blood Pressure is within target range for patient.. Pulse regular and within target range for patient.[name] Respirations regular, non-labored and within target range.. Temperature is normal and within the target range for the patient.. Patient's appearance is neat and clean. Appears in no acute distress. Well nourished and well developed.. Vitals Time Taken: 8:23 AM, Height: 64 in, Weight: 200 lbs, BMI: 34.3, Temperature: 97.8 F, Pulse: 70 bpm, Respiratory Rate: 17 breaths/min, Blood Pressure: 119/53 mmHg. General Notes: Wound exam; 2 small open areas on the right leg are very tiny and look as though they are on their way to healing. She has 3 wounds on the right foot including the base of the right first toe dorsally, the dorsal aspect of the right third toe, and the lateral aspect of the right fifth metatarsal head. All of these appear to be better and measuring smaller. However the remaining open area on the dorsal aspect of  the right great toe is a deep wound. Covered in necrotic material which I removed with a #3 curet. No bleeding. This probes to bone Integumentary (Hair, Skin) Wound #5 status is Open. Original cause of wound was Gradually Appeared. The wound is located on the Right,Distal Lower Leg. The wound measures 0.3cm length x 0.3cm width x 0.1cm depth; 0.071cm^2 area and 0.007cm^3 volume. There is Fat Layer (Subcutaneous Tissue) Exposed exposed. There is no tunneling or undermining noted. There is a small amount of serous drainage noted. The wound margin is flat and intact. There is large (67-100%) pink, friable granulation within the wound bed. There is no necrotic tissue within the wound bed. The periwound skin appearance exhibited: Scarring, Maceration, Hemosiderin Staining. The periwound skin appearance did not exhibit: Callus, Crepitus, Excoriation, Induration, Rash, Dry/Scaly, Atrophie Blanche, Cyanosis, Ecchymosis, Mottled, Pallor, Rubor, Erythema. Periwound temperature was noted as No Abnormality. Wound #6 status is Open. Original cause of  wound was Gradually Appeared. The wound is located on the Right,Proximal Lower Leg. The wound measures 1cm length x 0.3cm width x 0.1cm depth; 0.236cm^2 area and 0.024cm^3 volume. There is Fat Layer (Subcutaneous Tissue) Exposed exposed. There is no tunneling or undermining noted. There is a small amount of serous drainage noted. The wound margin is flat and intact. There is no granulation within the wound bed. There is a large (67-100%) amount of necrotic tissue within the wound bed including Adherent Slough. The periwound skin appearance exhibited: Scarring, Hemosiderin Staining. The periwound skin appearance did not exhibit: Callus, Crepitus, Excoriation, Induration, Rash, Dry/Scaly, Maceration, Atrophie Blanche, Cyanosis, Ecchymosis, Mottled, Pallor, Rubor, Erythema. Periwound temperature was noted as No Abnormality. The periwound has tenderness  on palpation. Wound #6 status is Open. Original cause of wound was Gradually Appeared. The wound is located on the Right,Proximal Lower Leg. The wound measures 1cm length x 0.3cm width x 0.1cm depth; 0.236cm^2 area and 0.024cm^3 volume. There is Fat Layer (Subcutaneous Tissue) Exposed exposed. There is no tunneling or undermining noted. There is a medium amount of serosanguineous drainage noted. The wound margin is flat and intact. There is no granulation within the wound bed. There is a medium (34-66%) amount of necrotic tissue within the wound bed including Adherent Slough. Periwound temperature was noted as No Abnormality. Wound #7 status is Open. Original cause of wound was Gradually Appeared. The wound is located on the Right,Dorsal Metatarsal head first. The wound measures 2.4cm length x 0.8cm width x 0.2cm depth; Sigala, Lamiracle V. ([id number]) 1.508cm^2 area and 0.302cm^3 volume. There is Fat Layer (Subcutaneous Tissue) Exposed exposed. There is no tunneling or undermining noted. There is a large amount of serous drainage noted. The wound margin is flat and intact. There is no granulation within the wound bed. There is a large (67-100%) amount of necrotic tissue within the wound bed including Adherent Slough. The periwound skin appearance exhibited: Induration, Scarring, Maceration, Ecchymosis, Hemosiderin Staining, Mottled. Periwound temperature was noted as No Abnormality. The periwound has tenderness on palpation. Wound #8 status is Open. Original cause of wound was Gradually Appeared. The wound is located on the Right,Dorsal Toe Third. The wound measures 0.4cm length x 0.5cm width x 0.1cm depth; 0.157cm^2 area and 0.016cm^3 volume. There is Fat Layer (Subcutaneous Tissue) Exposed exposed. There is no tunneling or undermining noted. There is a large amount of serous drainage noted. The wound margin is flat and intact. There is medium (34-66%) pink, pale granulation within the wound bed.  There is a small (1-33%) amount of necrotic tissue within the wound bed including Adherent Slough. The periwound skin appearance exhibited: Scarring, Mottled. The periwound skin appearance did not exhibit: Callus, Crepitus, Excoriation, Induration, Rash, Dry/Scaly, Maceration, Atrophie Blanche, Cyanosis, Ecchymosis, Hemosiderin Staining, Pallor, Rubor, Erythema. Periwound temperature was noted as No Abnormality. The periwound has tenderness on palpation. Wound #9 status is Open. Original cause of wound was Gradually Appeared. The wound is located on the Right,Distal,Lateral Foot. The wound measures 2.7cm length x 0.4cm width x 0.1cm depth; 0.848cm^2 area and 0.085cm^3 volume. There is Fat Layer (Subcutaneous Tissue) Exposed exposed. There is no tunneling or undermining noted. There is a medium amount of serous drainage noted. The wound margin is indistinct and nonvisible. There is small (1-33%) pink, pale granulation within the wound bed. There is a medium (34- 66%) amount of necrotic tissue within the wound bed including Adherent Slough. The periwound skin appearance exhibited: Maceration, Hemosiderin Staining. The periwound skin appearance did not exhibit: Callus, Crepitus,  Excoriation, Induration, Rash, Scarring, Dry/Scaly, Atrophie Blanche, Cyanosis, Ecchymosis, Mottled, Pallor, Rubor, Erythema. Periwound temperature was noted as No Abnormality. The periwound has tenderness on palpation. 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 ([id number]) Procedures Wound #7 Wound #7 is a Diabetic Wound/Ulcer of the Lower Extremity located on the Right,Dorsal Metatarsal head first . There was a Skin/Subcutaneous Tissue Debridement ([id number]) debridement with total area of 1.92 sq cm performed by Ricard Dillon, MD. with the following instrument(s): Curette to remove Non-Viable tissue/material including Exudate, Fat Layer (and Subcutaneous Tissue) Exposed, Fibrin/Slough, and Subcutaneous after achieving pain control using Lidocaine 4% Topical Solution. A time out was conducted at 08:40, prior to the start of the procedure. A Minimum amount of bleeding was controlled with Pressure. The procedure was tolerated well with a pain level of 0 throughout and a pain level of 0 following the procedure. Post Debridement Measurements: 2.4cm length x 0.8cm width x 0.2cm depth; 0.302cm^3 volume. Character of Wound/Ulcer Post Debridement requires further debridement. Severity of Tissue Post Debridement is: Fat layer exposed. Post procedure Diagnosis Wound #7: Same as Pre-Procedure Plan Wound Cleansing: Wound #5 Right,Distal Lower Leg: Clean wound with Normal Saline. Cleanse wound with mild soap and water Wound #6 Right,Proximal Lower Leg: Clean wound with Normal Saline. Cleanse wound with mild soap and water Wound #7 Right,Dorsal Metatarsal head first: Clean wound with Normal Saline. Cleanse wound with mild soap and water Wound #8 Right,Dorsal Toe Third: Clean wound with Normal Saline. Cleanse wound with mild soap and water Wound #9 Right,Distal,Lateral Foot: Clean wound with Normal Saline. Cleanse wound with mild soap and water Anesthetic: Wound #5 Right,Distal Lower Leg: Topical Lidocaine 4% cream applied to wound bed prior to debridement Wound #6 Right,Proximal Lower Leg: Topical Lidocaine 4% cream applied to wound bed prior to  debridement Wound #7 Right,Dorsal Metatarsal head first: Topical Lidocaine 4% cream applied to wound bed prior to debridement Wound #8 Right,Dorsal Toe Third: Topical Lidocaine 4% cream applied to wound bed prior to debridement Wound #9 Right,Distal,Lateral Foot: Paiz, Sharry V. ([id number]) Topical Lidocaine 4% cream applied to wound bed prior to debridement Primary Wound Dressing: Wound #5 Right,Distal Lower Leg: Prisma Ag Wound #6 Right,Proximal Lower Leg: Prisma Ag Wound #7 Right,Dorsal Metatarsal head first: Prisma Ag Wound #8 Right,Dorsal Toe Third: Prisma Ag Wound #9 Right,Distal,Lateral Foot: Prisma Ag Secondary Dressing: Wound #5 Right,Distal Lower Leg: Conform/Kerlix Wound #6 Right,Proximal Lower Leg: Conform/Kerlix Wound #7 Right,Dorsal Metatarsal head first: Dry Gauze Conform/[place]ix Wound #8 Right,Dorsal Toe Third: Dry Gauze Conform/Kerlix Wound #9 Right,Distal,Lateral Foot: Dry Gauze Conform/Kerlix Dressing Change Frequency: Wound #5 Right,Distal Lower Leg: Three times weekly Wound #6 Right,Proximal Lower Leg: Three times weekly Wound #7 Right,Dorsal Metatarsal head first: Change dressing every day. Wound #8 Right,Dorsal Toe Third: Change dressing every day. Wound #9 Right,Distal,Lateral Foot: Change dressing every day. Follow-up Appointments: Wound #5 Right,Distal Lower Leg: Return Appointment in 2 weeks. - has appt with heart doctor Wound #6 Right,Proximal Lower Leg: Return Appointment in 2 weeks. - has appt  with heart doctor Wound #7 Right,Dorsal Metatarsal head first: Return Appointment in 2 weeks. - has appt with heart doctor Wound #8 Right,Dorsal Toe Third: Return Appointment in 2 weeks. - has appt with heart doctor Wound #9 Right,Distal,Lateral Foot: Return Appointment in 2 weeks. - has appt with heart doctor Edema Control: Wound #5 Right,Distal Lower Leg: Elevate legs to the level of the heart and pump ankles as often as possible Salamon,  Tynasia V. ([id number]) Wound #6 Right,Proximal Lower Leg: Elevate legs to the level of the heart and pump ankles as often as possible Wound #7 Right,Dorsal Metatarsal head first: Elevate legs to the level of the heart and pump ankles as often as possible Wound #8 Right,Dorsal Toe Third: Elevate legs to the level of the heart and pump ankles as often as possible Wound #9 Right,Distal,Lateral Foot: Elevate legs to the level of the heart and pump ankles as often as possible Home Health: Wound #5 Right,Distal Lower Leg: Continue Home Health Visits - [facility] twice weekly, [facility]-Wednesday, teach family member to change on off day. Home Health Nurse may visit PRN to address patient s wound care needs. FACE TO FACE ENCOUNTER: MEDICARE and MEDICAID PATIENTS: I certify that this patient is under my care and that I had a face-to-face encounter that meets the physician face-to-face encounter requirements with this patient on this date. The encounter with the patient was in whole or in part for the following MEDICAL CONDITION: (primary reason for [place]) MEDICAL NECESSITY: I certify, that based on my findings, NURSING services are a medically necessary home health service. HOME BOUND STATUS: I certify that my clinical findings support that this patient is homebound (i.e., Due to illness or injury, pt requires aid of supportive devices such as crutches, cane, wheelchairs, walkers, the use of special transportation or the assistance of another person to leave their place of residence. There is a normal inability to leave the home and doing so requires considerable and taxing effort. Other absences are for medical reasons / religious services and are infrequent or of short duration when for other reasons). If current dressing causes regression in wound condition, may D/C ordered dressing product/s and apply Normal Saline Moist Dressing daily until next [place] / Other MD  appointment. [place] of regression in wound condition at [phone]. Please direct any NON-WOUND related issues/requests for orders to patient's Primary Care Physician Wound #6 Right,Proximal Lower Leg: [place] Visits - [facility] twice weekly, [facility]-Wednesday, teach family member to change on off day. Home Health Nurse may visit PRN to address patient s wound care needs. FACE TO FACE ENCOUNTER: MEDICARE and MEDICAID PATIENTS: I certify that this patient is under my care and that I had a face-to-face encounter that meets the physician face-to-face encounter requirements with this patient on this date. The encounter with the patient was in whole or in part for the following MEDICAL CONDITION: (primary reason for [place]) MEDICAL NECESSITY: I certify, that based on my findings, NURSING services are a medically necessary home health service. HOME BOUND STATUS: I certify that my clinical findings support that this patient is homebound (i.e., Due to illness or injury, pt requires aid of supportive devices such as crutches, cane, wheelchairs, walkers, the use of special transportation or the assistance of another person to leave their place of residence. There is a normal inability to leave the home and doing so requires considerable and taxing effort. Other absences are for medical reasons /  religious services and are infrequent or of short duration when for other reasons). If current dressing causes regression in wound condition, may D/C ordered dressing product/s and apply Normal Saline Moist Dressing daily until next [place] / Other MD appointment. [place] of regression in wound condition at [phone]. Please direct any NON-WOUND related issues/requests for orders to patient's Primary Care Physician Wound #7 Right,Dorsal Metatarsal head first: [place] Visits - [facility] twice weekly, W[facility] Care  Center-Wednesday, teach family member to change on off day. Home Health Nurse may visit PRN to address patient s wound care needs. FACE TO FACE ENCOUNTER: MEDICARE and MEDICAID PATIENTS: I certify that this patient is under my care and that I had a face-to-face encounter that meets the physician face-to-face encounter Ruvalcaba, TUNYA HELD. ([id number]) requirements with this patient on this date. The encounter with the patient was in whole or in part for the following MEDICAL CONDITION: (primary reason for [place]) MEDICAL NECESSITY: I certify, that based on my findings, NURSING services are a medically necessary home health service. HOME BOUND STATUS: I certify that my clinical findings support that this patient is homebound (i.e., Due to illness or injury, pt requires aid of supportive devices such as crutches, cane, wheelchairs, walkers, the use of special transportation or the assistance of another person to leave their place of residence. There is a normal inability to leave the home and doing so requires considerable and taxing effort. Other absences are for medical reasons / religious services and are infrequent or of short duration when for other reasons). If current dressing causes regression in wound condition, may D/C ordered dressing product/s and apply Normal Saline Moist Dressing daily until next [place] / Other MD appointment. [place] of regression in wound condition at [phone]. Please direct any NON-WOUND related issues/requests for orders to patient's Primary Care Physician Wound #8 Right,Dorsal Toe Third: [place] Visits - [facility] twice weekly, [facility]-Wednesday, teach family member to change on off day. Home Health Nurse may visit [facility] to address patient s wound care needs. FACE TO FACE ENCOUNTER: MEDICARE and MEDICAID PATIENTS: I certify that this patient is under my care and that I had a face-to-face encounter  that meets the physician face-to-face encounter requirements with this patient on this date. The encounter with the patient was in whole or in part for the following MEDICAL CONDITION: (primary reason for [place]) MEDICAL NECESSITY: I certify, that based on my findings, NURSING services are a medically necessary home health service. HOME BOUND STATUS: I certify that my clinical findings support that this patient is homebound (i.e., Due to illness or injury, pt requires aid of supportive devices such as crutches, cane, wheelchairs, walkers, the use of special transportation or the assistance of another person to leave their place of residence. There is a normal inability to leave the home and doing so requires considerable and taxing effort. Other absences are for medical reasons / religious services and are infrequent or of short duration when for other reasons). If current dressing causes regression in wound condition, may D/C ordered dressing product/s and apply Normal Saline Moist Dressing daily until next [place] / Other MD appointment. [place] of regression in wound condition at [phone]. Please direct any NON-WOUND related issues/requests for orders to patient's Primary Care Physician Wound #9 Right,Distal,Lateral Foot: [place] [facility] weekly[facility]-Wednesday, teach family member to change on  off day. Home Health Nurse may visit [facility] to address patient s wound care needs. FACE TO FACE ENCOUNTER: MEDICARE and MEDICAID PATIENTS: I certify that this patient is under my care and that I had a face-to-face encounter that meets the physician face-to-face encounter requirements with this patient on this date. The encounter with the patient was in whole or in part for the following MEDICAL CONDITION: (primary reason for [place]) MEDICAL NECESSITY: I certify, that based on my findings, NURSING services are  a medically necessary home health service. HOME BOUND STATUS: I certify that my clinical findings support that this patient is homebound (i.e., Due to illness or injury, pt requires aid of supportive devices such as crutches, cane, wheelchairs, walkers, the use of special transportation or the assistance of another person to leave their place of residence. There is a normal inability to leave the home and doing so requires considerable and taxing effort. Other absences are for medical reasons / religious services and are infrequent or of short duration when for other reasons). If current dressing causes regression in wound condition, may D/C ordered dressing product/s and apply Normal Saline Moist Dressing daily until next [place] / Other MD appointment. [place] of regression in wound condition at [phone]. Please direct any NON-WOUND related issues/requests for orders to patient's Primary Care Physician [place], ANAMARIA DUSENBURY ([id number]) #1 we changed to [place] toe all of these wounds #2 the patient does not have a follow-up with Dr. Bridgett Larsson until July according to her husband. #3 I have some concern about the reminiscent wound on the dorsal aspect of the right great toe. We may need further imaging tests here as she is a diabetic with a probe to bone small open area. Although all of this is measuring smaller #4 she has a cardiology appointment next Wednesday therefore we're going to put her out 2 weeks. Electronic Signature(s) Signed: 12/11/2016 5:27:22 PM By: Linton Ham MD Entered By: Linton Ham on 12/11/2016 08:59:07 [place], Treniyah [name] ([id number]) -------------------------------------------------------------------------------- SuperBill Details Patient Name: Corigliano, Thana V. Date of Service: 12/11/2016 Medical Record Patient Account Number: [id number] Number: Treating RN: Baruch Gouty, RN, BSN, Velva Harman 27-Dec-1940 [phone] y.o. 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 diabetic peripheral angiopathy with gangrene I70.232 Atherosclerosis 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 of calf I70.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 of foot Facility Procedures CPT4: Description Modifier Quantity Code [id number] - DEB SUBQ TISSUE 20 SQ CM/< 1 ICD-10 Description Diagnosis E11.622 Type 2 diabetes mellitus 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

## 2016-12-18 ENCOUNTER — Ambulatory Visit: Payer: Medicare HMO | Admitting: Internal Medicine

## 2016-12-25 ENCOUNTER — Encounter: Payer: Medicare HMO | Admitting: Internal Medicine

## 2016-12-25 DIAGNOSIS — E11622 Type 2 diabetes mellitus with other skin ulcer: Secondary | ICD-10-CM | POA: Diagnosis not present

## 2016-12-27 NOTE — Progress Notes (Addendum)
[name], CHANDRAN ([id number]) Visit Report for 12/25/2016 Chief Complaint Document Details Patient Name: [name], [name] Date of Service: 12/25/2016 10:15 AM Medical Record Patient Account Number: [id number] Number: Treating RN: Baruch Gouty, RN, BSN, Velva Harman 05/19/1941 [phone] y.o. Other Clinician: Date of Birth/Sex: Female) Treating Ciaira [name] Primary Care Provider: Beverlyn Roux Provider/Extender: G Referring Provider: Dorthula Nettles in Treatment: 7 Information Obtained from: Patient Chief Complaint The patient is here for initial evaluation of RLE and Right foot/toe ulcers Electronic Signature(s) Signed: 12/25/2016 5:34:29 PM By: Linton Ham MD Entered By: Linton Ham on 12/25/2016 10:54:46 [name], [name] ([id number]) -------------------------------------------------------------------------------- Debridement Details Patient Name: [name], [name] Date of Service: 12/25/2016 10:15 AM Medical Record Patient Account Number: [id number] Number: Treating RN: Baruch Gouty, RN, BSN, Velva Harman 1940-09-14 [phone] y.o. Other Clinician: Date of Birth/Sex: Female) Treating Aitana Burry, [place] Primary Care Provider: Beverlyn Roux Provider/Extender: G Referring Provider: Dorthula Nettles in Treatment: 7 Debridement Performed for Wound #7 Right,Dorsal Metatarsal head first Assessment: Performed By: Physician Ricard Dillon, MD Debridement: Debridement Severity of Tissue Pre Fat layer exposed Debridement: Pre-procedure Verification/Time Out Yes - 10:46 Taken: Start Time: 10:46 Pain Control: Lidocaine 4% Topical Solution Level: Skin/Subcutaneous Tissue Total Area Debrided (L x 2.4 (cm) x 0.8 (cm) = 1.92 (cm) W): Tissue and other Non-Viable, Exudate, Fat, Fibrin/Slough, Subcutaneous material debrided: Instrument: Curette Bleeding: Minimum Hemostasis Achieved: Pressure End Time: 10:48 Procedural Pain: 0 Post Procedural Pain: 0 Response to Treatment: Procedure was tolerated  well Post Debridement Measurements of Total Wound Length: (cm) 2.4 Width: (cm) 0.8 Depth: (cm) 1 Volume: (cm) 1.508 Character of Wound/Ulcer Post Requires Further Debridement Debridement: Severity of Tissue Post Debridement: Fat layer exposed Post Procedure Diagnosis Same as Pre-procedure Electronic Signature(s) DORANNE, SCHMUTZ ([id number]) Signed: 12/25/2016 5:08:53 PM By: Regan Lemming BSN, RN Signed: 12/25/2016 5:34:29 PM By: Linton Ham MD Entered By: Linton Ham on 12/25/2016 10:54:25 [name], [name] ([id number]) -------------------------------------------------------------------------------- HPI Details Patient Name: [name], [name] Date of Service: 12/25/2016 10:15 AM Medical Record Patient Account Number: [id number] Number: Treating RN: Baruch Gouty, RN, BSN, Velva Harman 01/30/1941 [phone] y.o. Other Clinician: Date of Birth/Sex: Female) Treating Stephen Turnbaugh Primary Care Provider: Beverlyn Roux Provider/Extender: G Referring Provider: Dorthula Nettles in Treatment: 7 History of Present Illness HPI Description: 08/13/16: This is a 76 year old woman who came predominantly for review of 3 cm in diameter circular wound to the left anterior lateral leg. She was in the ER on 08/01/16 I reviewed their notes. There was apparently pus coming out of the wound at that time and the patient arrived requesting debridement which they don't do in the emergency room. Nevertheless I can't see that they did any x-rays. There were no cultures done. She is a type II diabetic and I a note after the patient was in the clinic that she had a bypass graft from the popliteal to the tibial on the right on 02/28/16. She also had a right greater saphenous vein harvest on the same date for arterial bypass. She is going to have vascular studies including ABIs T ABIs on the right on 08/28/16. The patient's surgery was on 02/28/16 by Dr. Vallarie Mare she had a right below the knee popliteal artery to peroneal artery  bypass with reverse greater saphenous vein and an endarterectomy of the mid segment peroneal artery. Postoperatively she had a strong mild monophasic peroneal signal with a pink foot. It would appear that the patient is had some nonhealing in the surgical saphenous vein harvest site on the left  leg. [facility] I cannot see much information about this at all. Dr. Lucious Groves notes from 05/29/16 show that the patient's wounds "are not healed" the right first metatarsal wound healed but then opened back up. 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 At that point the patient had a wound VAC to her right calf with regards to the wounds on her dorsal right toe would appear that these are felt to be arterial wounds. She has had surgery on the metatarsal phalangeal in 2015 I Dr. Doran Durand secondary to a right metatarsal phalangeal joint fracture. She is apparently had discoloration around this area since then. 08/28/16; patient arrives with her wounds in much the same condition. The linear vein harvest site and the circular wound below it which I think was a blister. She also has to probing holes in her right great toe and a necrotic eschar on the right second toe. Because of these being arterial wounds I reduced her compression from 3-2 layers this seems to of done satisfactorily she has not had any problems. I cannot see that she is actually had an x-ray ====== 11/06/16 the patient comes in for evaluation of her right lower extremity ulcers. She was here in January 2018 for 2 visits subsequently ended up in the hospital with pneumonia and then to rehabilitation. She has now been discharged from rehabilitation and is home. She has multiple ulcerations to her right lower extremity including the foot and toes. She does have home health in place and they have been placing  alginate to the ulcers. She is followed by Dr. Bridgett Larsson of vascular medicine. 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 She recently saw him on 3/23. In office ABIs were: Mankowski, ERMINA OBERMAN. ([id number]) Right 0.48 with monophasic flow to the DP, PT, peroneal Left 0.63 with monophasic flow to the DP and PT Her arterial studies indicated a patent right below knee popliteal to peroneal bypass She had an MRI in February 2008 that was negative frosty myelitis but this showed general soft tissue edema in the right foot and lower extremity concerning for cellulitis She is a diabetic, managed with insulin. Her hemoglobin A1c in December 2017 was 8.4 which is a trend up from previous levels. She had blood work in February 2018 which revealed an albumin of 2.6 this appears to be relatively acute as an albumin in November 2017 was 3.7 11/13/16; this is a patient I have not seen since February who is readmitted to our clinic last week. 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 Bridgett Larsson. I have reviewed Dr. Lianne Moris notes from March/23/18. Doppler ABI on that date showed an ABI on the right of 0.48 and on the left of 0.63. Dorsalis pedis waveforms were monophasic bilaterally. There was no waveforms detected at the posterior tibial on the right, monophasic on the left. Dr. Lianne Moris comments were that this patient would have follow-up vascular studies in 3 months including ABIs and right lower extremity arterial duplex. She had an MRI in February that was negative for osteomyelitis but showed generalized edema in the foot. Last albumin I see was in January at 3.4 we have been using Santyl to the 4 wounds on the right leg the patient is noted today to have  widespread edema well up towards her groin this is pitting 2-3+. I reviewed her echocardiogram done in January which showed calcific  aortic stenosis mild to moderate. Normal ejection fraction. 11/20/16; patient has a follow-up appointment with Dr. Bridgett Larsson on April 23. She is still complaining of a lot of pain in the right foot and right leg. It is not clear to me that this is at all positional however I think it is clear claudication with minimal activity perhaps at rest. At our suggestion she is return to her primary physician's office tomorrow with regards to her pitting lower extremity bilateral edema that I reviewed in detail last week 11/27/16; the follow-up with Dr. Bridgett Larsson was actually on May 23 on April 23 as I stated in my note last week. N/A case all of her wounds seems somewhat smaller. The 2 on the right leg are definitely smaller. The areas on the dorsal right first toe, right third toe and the lateral part of the right fifth metatarsal head all looks smaller but have tightly adherent surfaces. We have been using Santyl 12/04/16; follow-up with Dr. Bridgett Larsson on May 23. 2 small open wounds on the right leg continue to get smaller. The area on the right third total lateral aspect of the right fifth toe also look better. The remaining area on the dorsal first toe still has some depth to it. We have been using Santyl to the toes and collagen on the right 12/11/16; according to patient's husband the follow-up with Dr. Bridgett Larsson is not until July. All of the wounds on the right leg are measuring smaller. We have been using some combination of Prisma and Santyl although I think we can go to straight Prisma today. There may have been some confusion with home health about the primary dressing orders here. 12/25/16; the patient has had some healing this week. The area on her right lateral fifth metatarsal head, right third toe are both healed lower right leg is healed. In the vein harvest site superiorly she has one superficial open area. On the dorsal aspect of her right great toe/MTP joint the wound is now divided into 2 however the proximal  area is deep and there is palpable bone Electronic Signature(s) Signed: 12/25/2016 5:34:29 PM By: Linton Ham MD Entered By: Linton Ham on 12/25/2016 10:58:06 Wrench, Saniah [name] ([id number]) -------------------------------------------------------------------------------- Physical Exam Details Patient Name: Roussin, Doniesha V. Date of Service: 12/25/2016 10:15 AM Medical Record Patient Account Number: [id number] Number: Treating RN: Baruch Gouty, RN, BSN, Velva Harman 02/14/1941 [phone] y.o. Other Clinician: Date of Birth/Sex: Female) Treating Makayia Duplessis Primary Care Provider: Beverlyn Roux Provider/Extender: G Referring Provider: Dorthula Nettles in Treatment: 7 Constitutional Sitting or standing Blood Pressure is within target range for patient.. Pulse regular and within target range for patient.[name] Respirations regular, non-labored and within target range.. Temperature is normal and within the target range for the patient.[name] appears in no distress. Notes Wound exam; the distal right leg wound is healed. Still an open area in the proximal scar however this appears better. oShe has had healing of the right lateral fifth metatarsal head the dorsal right third toe. oThe only remaining worrisome area is the proximal aspect of the right first phalanx. This seems to probe to bone a small open area. The more distal part of the original wound appears to be healing. Using #3 curet a relatively large amount of necrotic material removed from the base at this site hemostasis with direct pressure Electronic Signature(s) Signed: 12/25/2016 5:34:29 PM By:  Linton Ham MD Entered By: Linton Ham on 12/25/2016 11:01:18 [name], [name] ([id number]) -------------------------------------------------------------------------------- Physician Orders Details Patient Name: [name], [name] Date of Service: 12/25/2016 10:15 AM Medical Record Patient Account Number: [id number] Number: Treating RN:  Baruch Gouty, RN, BSN, Velva Harman Aug 29, 1940 [phone] y.o. Other Clinician: Date of Birth/Sex: Female) Treating Dacota Devall Primary Care Provider: Beverlyn Roux Provider/Extender: G Referring Provider: Dorthula Nettles in Treatment: 7 Verbal / Phone Orders: No Diagnosis Coding Wound Cleansing Wound #6 Right,Proximal Lower Leg o Clean wound with Normal Saline. o Cleanse wound with mild soap and water Wound #7 Right,Dorsal Metatarsal head first o Clean wound with Normal Saline. o Cleanse wound with mild soap and water Anesthetic Wound #6 Right,Proximal Lower Leg o Topical Lidocaine 4% cream applied to wound bed prior to debridement Wound #7 Right,Dorsal Metatarsal head first o Topical Lidocaine 4% cream applied to wound bed prior to debridement Primary Wound Dressing Wound #6 Right,Proximal Lower Leg o Prisma Ag Wound #7 Right,Dorsal Metatarsal head first o Prisma Ag Secondary Dressing Wound #6 Right,Proximal Lower Leg o Dry Gauze o Boardered Foam Dressing Wound #7 Right,Dorsal Metatarsal head first o Dry Gauze o Conform/Kerlix Dressing Change Frequency Wound #6 Right,Proximal Lower Leg Petrov, Evalie V. ([id number]) o Three times weekly Wound #7 Right,Dorsal Metatarsal head first o Change dressing every day. Follow-up Appointments Wound #6 Right,Proximal Lower Leg o Return Appointment in 1 week. Wound #7 Right,Dorsal Metatarsal head first o Return Appointment in 1 week. Edema Control Wound #6 Right,Proximal Lower Leg o Elevate legs to the level of the heart and pump ankles as often as possible Wound #7 Right,Dorsal Metatarsal head first o Elevate legs to the level of the heart and pump ankles as often as possible Home Health Wound #6 Right,Proximal Lower Leg o [place] [facility] twice weekly, [facility]-Wednesday, teach family member to change on off day. o Home Health Nurse may visit PRN to address patientos wound  care needs. o FACE TO FACE ENCOUNTER: MEDICARE and MEDICAID PATIENTS: I certify that this patient is under my care and that I had a face-to-face encounter that meets the physician face-to-face encounter requirements with this patient on this date. The encounter with the patient was in whole or in part for the following MEDICAL CONDITION: (primary reason for [place]) MEDICAL NECESSITY: I certify, that based on my findings, NURSING services are a medically necessary home health service. HOME BOUND STATUS: I certify that my clinical findings support that this patient is homebound (i.e., Due to illness or injury, pt requires aid of supportive devices such as crutches, cane, wheelchairs, walkers, the use of special transportation or the assistance of another person to leave their place of residence. There is a normal inability to leave the home and doing so requires considerable and taxing effort. Other absences are for medical reasons / religious services and are infrequent or of short duration when for other reasons). o If current dressing causes regression in wound condition, may D/C ordered dressing product/s and apply Normal Saline Moist Dressing daily until next [place] / Other MD appointment. [place] of regression in wound condition at [phone]. o Please direct any NON-WOUND related issues/requests for orders to patient's Primary Care Physician Wound #7 Right,Dorsal Metatarsal head first o [place] [facility] twice weekly, [facility]-Wednesday, teach family member to change on off day. o Home Health Nurse may visit PRN to address patientos wound care needs. o FACE TO FACE  ENCOUNTER: MEDICARE and MEDICAID PATIENTS: I certify that this patient is under my care and that I had a face-to-face encounter that meets the physician face-to-face [name], [name] ESTEVE. ([id number]) encounter requirements with this patient on  this date. The encounter with the patient was in whole or in part for the following MEDICAL CONDITION: (primary reason for [place]) MEDICAL NECESSITY: I certify, that based on my findings, NURSING services are a medically necessary home health service. HOME BOUND STATUS: I certify that my clinical findings support that this patient is homebound (i.e., Due to illness or injury, pt requires aid of supportive devices such as crutches, cane, wheelchairs, walkers, the use of special transportation or the assistance of another person to leave their place of residence. There is a normal inability to leave the home and doing so requires considerable and taxing effort. Other absences are for medical reasons / religious services and are infrequent or of short duration when for other reasons). o If current dressing causes regression in wound condition, may D/C ordered dressing product/s and apply Normal Saline Moist Dressing daily until next [place] / Other MD appointment. [place] of regression in wound condition at [phone]. o Please direct any NON-WOUND related issues/requests for orders to patient's Primary Care Physician Electronic Signature(s) Signed: 12/25/2016 5:08:53 PM By: Regan Lemming BSN, RN Signed: 12/25/2016 5:34:29 PM By: Linton Ham MD Entered By: Regan Lemming on 12/25/2016 11:08:27 Rieth, [name] ([id number]) -------------------------------------------------------------------------------- Problem List Details Patient Name: Camera, Shealeigh V. Date of Service: 12/25/2016 10:15 AM Medical Record Patient Account Number: [id number] Number: Treating RN: Baruch Gouty, RN, BSN, Velva Harman 07-24-1941 [phone] y.o. 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 Date of Service: 12/25/2016 10:15 AM Medical Record Patient Account Number: [id number] Number: Treating RN: Baruch Gouty, RN, BSN, Velva Harman February 08, 1941 [phone] y.o. Other Clinician: Date of Birth/Sex: Female) Treating Giannah Zavadil Primary Care Provider: Beverlyn Roux Provider/Extender: G Referring Provider: Dorthula Nettles in Treatment: 7 Subjective Chief Complaint Information obtained from Patient The patient is here for initial evaluation of RLE and Right foot/toe ulcers History of Present Illness (HPI) 08/13/16: This is a 76 year old woman who came predominantly for review of 3 cm in diameter circular wound to the left anterior lateral leg. She was in the ER on 08/01/16 I reviewed their notes. There was apparently pus coming out of the wound at that time and the patient arrived requesting debridement which they don't do in the emergency room. Nevertheless I can't see that they did any x-rays. There were no  cultures done. She is a type II diabetic  and I a note after the patient was in the clinic that she had a bypass graft from the popliteal to the tibial on the right on 02/28/16. She also had a right greater saphenous vein harvest on the same date for arterial bypass. She is going to have vascular studies including ABIs T ABIs on the right on 08/28/16. The patient's surgery was on 02/28/16 by Dr. Vallarie Mare she had a right below the knee popliteal artery to peroneal artery bypass with reverse greater saphenous vein and an endarterectomy of the mid segment peroneal artery. Postoperatively she had a strong mild monophasic peroneal signal with a pink foot. It would appear that the patient is had some nonhealing in the surgical saphenous vein harvest site on the left leg. Surprisingly [facility] I cannot see much information about this at all. Dr. Lucious Groves notes from 05/29/16 show that the patient's wounds "are not healed" the right first metatarsal wound healed but then opened back up. 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 At that point the patient had a wound VAC to her right calf with regards to the wounds on her dorsal right toe would appear that these are felt to be arterial wounds. She has had surgery on the metatarsal phalangeal in 2015 I Dr. Doran Durand secondary to a right metatarsal phalangeal joint fracture. She is apparently had discoloration around this area since then. 08/28/16; patient arrives with her wounds in much the same condition. The linear vein harvest site and the circular wound below it which I think was a blister. She also has to probing holes in her right great toe and a necrotic eschar on the right second toe. Because of these being arterial wounds I reduced her compression from 3-2 layers this seems to of done satisfactorily she has not had any problems. I cannot  see that she is actually had an x-ray ====== 11/06/16 the patient comes in for evaluation of her right lower extremity ulcers. She was here in January 2018 for 2 visits subsequently ended up in the hospital with pneumonia and then to rehabilitation. She has now been [name], [name] ([id number]) discharged from rehabilitation and is home. She has multiple ulcerations to her right lower extremity including the foot and toes. She does have home health in place and they have been placing alginate to the ulcers. She is followed by Dr. Bridgett Larsson of vascular medicine. 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 She recently saw him on 3/23. In office ABIs were: Right 0.48 with monophasic flow to the DP, PT, peroneal Left 0.63 with monophasic flow to the DP and PT Her arterial studies indicated a patent right below knee popliteal to peroneal bypass She had an MRI in February 2008 that was negative frosty myelitis but this showed general soft tissue edema in the right foot and lower extremity concerning for cellulitis She is a diabetic, managed with insulin. Her hemoglobin A1c in December 2017 was 8.4 which is a trend up from previous levels. She had blood work in February 2018 which revealed an albumin of 2.6 this appears to be relatively acute as an albumin in November 2017 was 3.7 11/13/16; this is a patient I have not seen since February who is readmitted to our clinic last week. She is a type II diabetic on insulin with known severe PAD  status post revascularization in the left leg by Dr. Bridgett Larsson. I have reviewed Dr. Lianne Moris notes from March/23/18. Doppler ABI on that date showed an ABI on the right of 0.48 and on the left of 0.63. Dorsalis pedis waveforms were monophasic bilaterally. There was no waveforms detected at the posterior tibial on the right, monophasic on the left. Dr. Lianne Moris comments were that this patient would have follow-up  vascular studies in 3 months including ABIs and right lower extremity arterial duplex. She had an MRI in February that was negative for osteomyelitis but showed generalized edema in the foot. Last albumin I see was in January at 3.4 we have been using Santyl to the 4 wounds on the right leg the patient is noted today to have widespread edema well up towards her groin this is pitting 2-3+. 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 Normal ejection fraction. 11/20/16; patient has a follow-up appointment with Dr. Bridgett Larsson on April 23. She is still complaining of a lot of pain in the right foot and right leg. It is not clear to me that this is at all positional however I think it is clear claudication with minimal activity perhaps at rest. At our suggestion she is return to her primary physician's office tomorrow with regards to her pitting lower extremity bilateral edema that I reviewed in detail last week 11/27/16; the follow-up with Dr. Bridgett Larsson was actually on May 23 on April 23 as I stated in my note last week. N/A case all of her wounds seems somewhat smaller. The 2 on the right leg are definitely smaller. The areas on the dorsal right first toe, right third toe and the lateral part of the right fifth metatarsal head all looks smaller but have tightly adherent surfaces. We have been using Santyl 12/04/16; follow-up with Dr. Bridgett Larsson on May 23. 2 small open wounds on the right leg continue to get smaller. The area on the right third total lateral aspect of the right fifth toe also look better. The remaining area on the dorsal first toe still has some depth to it. We have been using Santyl to the toes and collagen on the right 12/11/16; according to patient's husband the follow-up with Dr. Bridgett Larsson is not until July. All of the wounds on the right leg are measuring smaller. We have been using some combination of Prisma and Santyl although I think we can go to  straight Prisma today. There may have been some confusion with home health about the primary dressing orders here. 12/25/16; the patient has had some healing this week. The area on her right lateral fifth metatarsal head, right third toe are both healed lower right leg is healed. In the vein harvest site superiorly she has one superficial open area. On the dorsal aspect of her right great toe/MTP joint the wound is now divided into 2 however the proximal area is deep and there is palpable bone [name], [name] ([id number]) Objective Constitutional Sitting or standing Blood Pressure is within target range for patient.. Pulse regular and within target range for patient.[name] Respirations regular, non-labored and within target range.. Temperature is normal and within the target range for the patient.[name] appears in no distress. Vitals Time Taken: 10:20 AM, Height: 64 in, Weight: 200 lbs, BMI: 34.3, Temperature: 97.7 F, Pulse: 74 bpm, Respiratory Rate: 18 breaths/min, Blood Pressure: 136/46 mmHg, Capillary Blood Glucose: 165 mg/dl. General Notes: Wound exam; the distal right leg wound is healed. Still an  open area in the proximal scar however this appears better. She has had healing of the right lateral fifth metatarsal head the dorsal right third toe. The only remaining worrisome area is the proximal aspect of the right first phalanx. This seems to probe to bone a small open area. The more distal part of the original wound appears to be healing. Using #3 curet a relatively large amount of necrotic material removed from the base at this site hemostasis with direct pressure Integumentary (Hair, Skin) Wound #5 status is Healed - Epithelialized. Original cause of wound was Gradually Appeared. The wound is located on the Right,Distal Lower Leg. The wound measures 0cm length x 0cm width x 0cm depth; 0cm^2 area and 0cm^3 volume. There is no tunneling or undermining noted. There is a none present amount of  drainage noted. The wound margin is flat and intact. There is no granulation within the wound bed. There is no necrotic tissue within the wound bed. The periwound skin appearance exhibited: Scarring, Dry/Scaly, Hemosiderin Staining. The periwound skin appearance did not exhibit: Callus, Crepitus, Excoriation, Induration, Rash, Maceration, Atrophie Blanche, Cyanosis, Ecchymosis, Mottled, Pallor, Rubor, Erythema. Periwound temperature was noted as No Abnormality. Wound #6 status is Open. Original cause of wound was Gradually Appeared. The wound is located on the Right,Proximal Lower Leg. The wound measures 0.7cm length x 0.3cm width x 0.1cm depth; 0.165cm^2 area and 0.016cm^3 volume. There is Fat Layer (Subcutaneous Tissue) Exposed exposed. There is no tunneling or undermining noted. There is a medium amount of serosanguineous drainage noted. The wound margin is flat and intact. There is large (67-100%) red, friable granulation within the wound bed. There is no necrotic tissue within the wound bed. The periwound skin appearance exhibited: Hemosiderin Staining, Mottled. The periwound skin appearance did not exhibit: Callus, Crepitus, Excoriation, Induration, Rash, Scarring, Dry/Scaly, Maceration, Atrophie Blanche, Cyanosis, Ecchymosis, Pallor, Rubor, Erythema. Periwound temperature was noted as No Abnormality. Wound #7 status is Open. Original cause of wound was Gradually Appeared. The wound is located on the Right,Dorsal Metatarsal head first. The wound measures 2.4cm length x 0.8cm width x 1cm depth; 1.508cm^2 area and 1.508cm^3 volume. There is Fat Layer (Subcutaneous Tissue) Exposed exposed. There is no tunneling or undermining noted. There is a large amount of serous drainage noted. The wound margin is flat and intact. There is medium (34-66%) pink, friable granulation within the wound bed. There is a medium (34-66%) amount of necrotic tissue within the wound bed including Adherent Slough.  The periwound skin appearance exhibited: Induration, Scarring, Maceration, Ecchymosis, Hemosiderin Staining, Massmann, Milany V. ([id number]) Mottled. Periwound temperature was noted as No Abnormality. The periwound has tenderness on palpation. Wound #8 status is Healed - Epithelialized. Original cause of wound was Gradually Appeared. The wound is located on the Right,Dorsal Toe Third. The wound measures 0cm length x 0cm width x 0cm depth; 0cm^2 area and 0cm^3 volume. There is no tunneling or undermining noted. There is a none present amount of drainage noted. The wound margin is flat and intact. There is large (67-100%) friable granulation within the wound bed. There is no necrotic tissue within the wound bed. The periwound skin appearance exhibited: Scarring, Dry/Scaly, Mottled. The periwound skin appearance did not exhibit: Callus, Crepitus, Excoriation, Induration, Rash, Maceration, Atrophie Blanche, Cyanosis, Ecchymosis, Hemosiderin Staining, Pallor, Rubor, Erythema. Periwound temperature was noted as No Abnormality. Wound #9 status is Healed - Epithelialized. Original cause of wound was Gradually Appeared. The wound is located on the Right,Distal,Lateral Foot. The wound measures 0cm length x  0cm width x 0cm depth; 0cm^2 area and 0cm^3 volume. There is no tunneling or undermining noted. There is a none present amount of drainage noted. The wound margin is indistinct and nonvisible. There is large (67-100%) friable granulation within the wound bed. There is no necrotic tissue within the wound bed. The periwound skin appearance exhibited: Hemosiderin Staining. The periwound skin appearance did not exhibit: Callus, Crepitus, Excoriation, Induration, Rash, Scarring, Dry/Scaly, Maceration, Atrophie Blanche, Cyanosis, Ecchymosis, Mottled, Pallor, Rubor, Erythema. Periwound temperature was noted as No Abnormality. 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 There was a Skin/Subcutaneous Tissue Debridement ([id number]) debridement with total area of 1.92 sq cm performed by Ricard Dillon, MD. with the following instrument(s): Curette to remove Non-Viable tissue/material including Exudate, Fat Layer (and Subcutaneous Tissue) Exposed, Fibrin/Slough, and Remedios, Deborra V. ([id number]) Subcutaneous after achieving pain control using Lidocaine 4% Topical Solution. A time out was conducted at 10:46, prior to the start of the procedure. A Minimum amount of bleeding was controlled with Pressure. The procedure was tolerated well with a pain level of 0 throughout and a pain level of 0 following the procedure. Post Debridement Measurements: 2.4cm length x 0.8cm width x 1cm depth; 1.508cm^3 volume. Character of Wound/Ulcer Post Debridement requires further debridement. Severity of Tissue Post Debridement is: Fat layer exposed. Post procedure Diagnosis Wound #7: Same as Pre-Procedure Plan Wound Cleansing: Wound #6 Right,Proximal Lower Leg: Clean wound with Normal Saline. Cleanse wound with mild soap and water Wound #7 Right,Dorsal Metatarsal head first: Clean wound with Normal Saline. Cleanse wound with mild soap and water Anesthetic: Wound #6 Right,Proximal Lower  Leg: Topical Lidocaine 4% cream applied to wound bed prior to debridement Wound #7 Right,Dorsal Metatarsal head first: Topical Lidocaine 4% cream applied to wound bed prior to debridement Primary Wound Dressing: Wound #6 Right,Proximal Lower Leg: Prisma Ag Wound #7 Right,Dorsal Metatarsal head first: Prisma Ag Secondary Dressing: Wound #6 Right,Proximal Lower Leg: Dry Gauze Boardered Foam Dressing Wound #7 Right,Dorsal Metatarsal head first: Dry Gauze Conform/Kerlix Dressing Change Frequency: Wound #6 Right,Proximal Lower Leg: Three times weekly Wound #7 Right,Dorsal Metatarsal head first: Change dressing every day. Follow-up Appointments: Wound #6 Right,Proximal Lower Leg: Return Appointment in 1 week. Wound #7 Right,Dorsal Metatarsal head first: Return Appointment in 1 week. Edema Control: [name], [name] ([id number]) Wound #6 Right,Proximal Lower Leg: Elevate legs to the level of the heart and pump ankles as often as possible Wound #7 Right,Dorsal Metatarsal head first: Elevate legs to the level of the heart and pump ankles as often as possible Home Health: Wound #6 Right,Proximal Lower Leg: Continue Home Health Visits - [facility], teach family member to change on off day. Home Health Nurse may visit [facility] to address patient s wound care needs. FACE TO FACE ENCOUNTER: MEDICARE and MEDICAID PATIENTS: I certify that  this patient is under my care and that I had a face-to-face encounter that meets the physician face-to-face encounter requirements with this patient on this date. The encounter with the patient was in whole or in part for the following MEDICAL CONDITION: (primary reason for [place]) MEDICAL NECESSITY: I certify, that based on my findings, NURSING services are a medically necessary home health service. HOME BOUND STATUS: I certify that my clinical findings support that this patient is homebound (i.e., Due to illness  or injury, pt requires aid of supportive devices such as crutches, cane, wheelchairs, walkers, the use of special transportation or the assistance of another person to leave their place of residence. There is a normal inability to leave the home and doing so requires considerable and taxing effort. Other absences are for medical reasons / religious services and are infrequent or of short duration when for other reasons). If current dressing causes regression in wound condition, may D/C ordered dressing product/s and apply Normal Saline Moist Dressing daily until next [facility] / Other MD appointment. [place] of regression in wound condition at [phone]. Please direct any NON-WOUND related issues/requests for orders to patient's Primary Care Physician Wound #7 Right,Dorsal Metatarsal head first: [place] Visits - [facility] twice weekly, [facility], teach family member to change on off day. Home Health Nurse may visit [facility] to address patient s wound care needs. FACE TO FACE ENCOUNTER: MEDICARE and MEDICAID PATIENTS: I certify that this patient is under my care and that I had a face-to-face encounter that meets the physician face-to-face encounter requirements with this patient on this date. The encounter with the patient was in whole or in part for the following MEDICAL CONDITION: (primary reason for [place]) MEDICAL NECESSITY: I certify, that based on my findings, NURSING services are a medically necessary home health service. HOME BOUND STATUS: I certify that my clinical findings support that this patient is homebound (i.e., Due to illness or injury, pt requires aid of supportive devices such as crutches, cane, wheelchairs, walkers, the use of special transportation or the assistance of another person to leave their place of residence. There is a normal inability to leave the home and doing so requires considerable and taxing  effort. Other absences are for medical reasons / religious services and are infrequent or of short duration when for other reasons). If current dressing causes regression in wound condition, may D/C ordered dressing product/s and apply Normal Saline Moist Dressing daily until next [place] / Other MD appointment. [place] of regression in wound condition at [phone]. Please direct any NON-WOUND related issues/requests for orders to patient's Primary Care Physician #1 may need imaging of the first toe. The original wound is now divided into 2 however. #2 for now continue collagen to the right first toe as well as the proximal right leg Kleine, Sharay V. ([id number]) Electronic Signature(s) Signed: 01/03/2017 10:24:52 AM By: Gretta Cool, BSN, RN, CWS, Kim RN, BSN Signed: 01/10/2017 5:56:02 PM By: Linton Ham MD Previous Signature: 12/25/2016 5:34:29 PM Version By: Linton Ham MD Entered By: Gretta Cool, BSN, RN, CWS, Kim on 01/03/2017 10:24:51 Rumery, Camerin [name] ([id number]) -------------------------------------------------------------------------------- SuperBill Details Patient Name: Rodenbaugh, Airiana V. Date of Service: 12/25/2016 Medical Record Patient Account Number: [id number] Number: Treating RN: Baruch Gouty, RN, BSN, Velva Harman [birth year] [phone] y.o. 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 E11.52 Type 2 diabetes mellitus with diabetic peripheral angiopathy with gangrene I70.232 Atherosclerosis of native arteries of right  leg with ulceration of calf I70.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 of foot Facility Procedures CPT4: Description Modifier Quantity Code [id number] - DEB SUBQ TISSUE 20 SQ CM/< 1 ICD-10 Description Diagnosis E11.622 Type 2 diabetes mellitus with other skin ulcer E11.621 Type 2 diabetes mellitus with foot ulcer L97.519 Non-pressure chronic ulcer  of other part of right foot with unspecified severity Physician Procedures CPT4: Description Modifier Quantity Code [id number] - WC PHYS SUBQ TISS 20 SQ CM 1 ICD-10 Description Diagnosis E11.622 Type 2 diabetes mellitus with other skin ulcer E11.621 Type 2 diabetes mellitus with foot ulcer L97.519 Non-pressure chronic ulcer  of other part of right foot with unspecified severity Wisnieski, ILEAH FALKENSTEIN ([id number]) Electronic Signature(s) Signed: 12/25/2016 5:34:29 PM By: Linton Ham MD Entered By: Linton Ham on 12/25/2016 11:04:39

## 2016-12-27 NOTE — Progress Notes (Signed)
MIYANNA, WIERSMA ([id number]) Visit Report for 12/25/2016 Arrival Information Details Patient Name: [name], [name] Date of Service: 12/25/2016 10:15 AM Medical Record Number: [id number] Patient Account Number: [id number] Date of Birth/Sex: [birth year] (76 y.o. Female) Treating RN: Afful, RN, BSN, Velva Harman Primary Care Osama Coleson: Beverlyn Roux Other Clinician: Referring Aurielle Slingerland: Beverlyn Roux Treating Zephyr Sausedo/Extender: Tito Dine in Treatment: 7 Visit Information History Since Last Visit All ordered tests and consults were completed: No Patient Arrived: Wheel Chair Added or deleted any medications: No Arrival Time: 10:19 Any new allergies or adverse reactions: No Accompanied By: husband Had a fall or experienced change in No activities of daily living that may affect Transfer Assistance: None risk of falls: Patient Identification Verified: Yes Signs or symptoms of abuse/neglect since last No Secondary Verification Process Yes visito Completed: Hospitalized since last visit: No Patient Requires Transmission-Based No Has Dressing in Place as Prescribed: Yes Precautions: Pain Present Now: Yes Patient Has Alerts: No Electronic Signature(s) Signed: 12/25/2016 5:08:53 PM By: Regan Lemming BSN, RN Entered By: Regan Lemming on 12/25/2016 10:19:30 Placke, Reality [name] ([id number]) -------------------------------------------------------------------------------- Encounter Discharge Information Details Patient Name: Reich, [name] Date of Service: 12/25/2016 10:15 AM Medical Record Number: [id number] Patient Account Number: [id number] Date of Birth/Sex: 01/15/1941 (76 y.o. Female) Treating RN: Afful, RN, BSN, Velva Harman Primary Care Aleesia Henney: Beverlyn Roux Other Clinician: Referring Dyna Figuereo: Beverlyn Roux Treating Mira Balon/Extender: Tito Dine in Treatment: 7 Encounter Discharge Information Items Discharge Pain Level: 0 Discharge Condition: Stable Ambulatory Status:  Wheelchair Discharge Destination: Home Transportation: Private Auto Accompanied By: husband Schedule Follow-up Appointment: No Medication Reconciliation completed and provided to Patient/Care No Emmabelle Fear: Provided on Clinical Summary of Care: 12/25/2016 Form Type Recipient Paper Patient [facility] Electronic Signature(s) Signed: 12/25/2016 11:11:08 AM By: Ruthine Dose Entered By: Ruthine Dose on 12/25/2016 11:11:08 Earnhart, Avalene V. ([id number]) -------------------------------------------------------------------------------- Lower Extremity Assessment Details Patient Name: Bibb, [name] Date of Service: 12/25/2016 10:15 AM Medical Record Number: [id number] Patient Account Number: [id number] Date of Birth/Sex: [birth year] (76 y.o. Female) Treating RN: Afful, RN, BSN, Velva Harman Primary Care Alya Smaltz: Beverlyn Roux Other Clinician: Referring Sharin Altidor: Beverlyn Roux Treating Tori Cupps/Extender: Tito Dine in Treatment: 7 Edema Assessment Assessed: [Left: No] [Right: No] E[Left: dema] [Right: :] Calf Left: Right: Point of Measurement: 34 cm From Medial Instep cm 44.2 cm Ankle Left: Right: Point of Measurement: 11 cm From Medial Instep cm 27.8 cm Vascular Assessment Claudication: Claudication Assessment [Right:None] Pulses: Dorsalis Pedis Palpable: [Right:Yes] Posterior Tibial Extremity colors, hair growth, and conditions: Extremity Color: [Right:Mottled] Hair Growth on Extremity: [Right:No] Temperature of Extremity: [Right:Warm] Capillary Refill: [Right:< 3 seconds] Toe Nail Assessment Left: Right: Thick: Yes Discolored: Yes Deformed: No Improper Length and Hygiene: No Electronic Signature(s) Signed: 12/25/2016 5:08:53 PM By: Regan Lemming BSN, RN Entered By: Regan Lemming on 12/25/2016 10:25:20 Hedding, Andreina V. ([id number]) Niederer, Anamika V. ([id number]) -------------------------------------------------------------------------------- Multi Wound Chart Details Patient Name:  Hearst, [name] Date of Service: 12/25/2016 10:15 AM Medical Record Number: [id number] Patient Account Number: [id number] Date of Birth/Sex: 12-09-1940 (76 y.o. Female) Treating RN: Afful, RN, BSN, Velva Harman Primary Care Karesha Trzcinski: Beverlyn Roux Other Clinician: Referring Antone Summons: Beverlyn Roux Treating Samarra Ridgely/Extender: Tito Dine in Treatment: 7 Vital Signs Height(in): 64 Capillary Blood 165 Glucose(mg/dl): Weight(lbs): 200 Pulse(bpm): 74 Body Mass Index(BMI): 34 Blood Pressure Temperature(F): 97.7 136/46 (mmHg): Respiratory Rate 18 (breaths/min): Photos: [5:No Photos] [6:No Photos] [7:No Photos] Wound Location: [5:Right Lower Leg - DistalRight Lower Leg -] [6:Proximal] [7:Right Metatarsal head first - Dorsal] Wounding Event: [5:Gradually Appeared] [6:Gradually  Appeared] [7:Gradually Appeared] 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] [6:the Lower Extremity] [7:Diabetic Wound/Ulcer of the Lower Extremity] Comorbid History: [5:Cataracts, Chronic sinus Cataracts, Chronic sinus problems/congestion, Congestive Heart Failure, Congestive Heart Failure, Hypertension, Type II Diabetes] [6:problems/congestion, Hypertension, Type II Diabetes] [7:Cataracts, Chronic  sinus problems/congestion, Congestive Heart Failure, Hypertension, Type II Diabetes] Date Acquired: [5:09/09/2016] [6:09/09/2016] [7:08/06/2013] Weeks of Treatment: [5:7] [6:7] [7:7] Wound Status: [5:Healed - Epithelialized] [6:Open] [7:Open] Measurements L x W x D 0x0x0 [6:0.7x0.3x0.1] [7:2.4x0.8x0.2] (cm) Area (cm) : [5:0] [6:0.165] [7:1.508] Volume (cm) : [5:0] [6:0.016] [7:0.302] % Reduction in Area: [5:100.00%] [6:47.50%] [7:-28.00%] % Reduction in Volume: 100.00% [6:48.40%] [7:14.40%] Classification: [5:Grade 1] [6:Grade 2] [7:Grade 2] Exudate Amount: [5:None Present] [6:Medium] [7:Large] Exudate Type: [5:N/A] [6:Serosanguineous] [7:Serous] Exudate Color: [5:N/A]  [6:red, brown] [7:amber] Wound Margin: [5:Flat and Intact] [6:Flat and Intact] [7:Flat and Intact] Granulation Amount: [5:None Present (0%)] [6:Large (67-100%)] [7:Medium (34-66%)] Granulation Quality: [5:N/A] [6:Red, Friable] [7:Pink, Friable] Necrotic Amount: [5:None Present (0%)] [6:None Present (0%)] [7:Medium (34-66%)] Exposed Structures: [5:Fascia: No Fat Layer (Subcutaneous Tissue) Exposed: Yes] [6:Fat Layer (Subcutaneous] [7:Fat Layer (Subcutaneous Tissue) Exposed: Yes] Tissue) Exposed: No Fascia: No Fascia: No Tendon: No Tendon: No Tendon: No Muscle: No Muscle: No Muscle: No Joint: No Joint: No Joint: No Bone: No Bone: No Bone: No Epithelialization: Large (67-100%) Medium (34-66%) Small (1-33%) Periwound Skin Texture: Scarring: Yes Excoriation: No Induration: Yes Excoriation: No Induration: No Scarring: Yes Induration: No Callus: No Callus: No Crepitus: No Crepitus: No Rash: No Rash: No Scarring: No Periwound Skin Dry/Scaly: Yes Maceration: No Maceration: Yes Moisture: Maceration: No Dry/Scaly: No Periwound Skin Color: Hemosiderin Staining: Yes Hemosiderin Staining: Yes Ecchymosis: Yes Atrophie Blanche: No Mottled: Yes Hemosiderin Staining: Yes Cyanosis: No Atrophie Blanche: No Mottled: Yes Ecchymosis: No Cyanosis: No Erythema: No Ecchymosis: No Mottled: No Erythema: No Pallor: No Pallor: No Rubor: No Rubor: No Temperature: No Abnormality No Abnormality No Abnormality Tenderness on No No Yes Palpation: Wound Preparation: Ulcer Cleansing: Ulcer Cleansing: Ulcer Cleansing: Rinsed/Irrigated with Rinsed/Irrigated with Rinsed/Irrigated with Saline Saline Saline Topical Anesthetic Topical Anesthetic Topical Anesthetic Applied: None Applied: None Applied: Other: lidociane 4% Wound Number: [id number] N/A Photos: No Photos No Photos N/A Wound Location: Right Toe Third - Dorsal Right Foot - Lateral, Distal N/A Wounding Event: Gradually Appeared  Gradually Appeared N/A 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 ([id number]) Volume (cm) : 0 0 N/A % Reduction in Area: 100.00% 100.00% N/A % Reduction in Volume: 100.00% 100.00% N/A Classification: Grade 1 Grade 1 N/A Exudate Amount: None Present None Present N/A Exudate Type: N/A N/A N/A Exudate Color: N/A N/A N/A Wound Margin: Flat and Intact Indistinct, nonvisible N/A Granulation Amount: Large (67-100%) Large (67-100%) N/A Granulation Quality: Friable Friable N/A Necrotic Amount: None Present (0%) None Present (0%) N/A Exposed Structures: Fascia: No Fascia: No N/A Fat Layer (Subcutaneous Fat Layer (Subcutaneous Tissue) Exposed: No Tissue) Exposed: No Tendon: No Tendon: No Muscle: No Muscle: No Joint: No Joint: No Bone: No Bone: No Epithelialization: Large (67-100%) Large (67-100%) N/A Periwound Skin Texture: Scarring: Yes Excoriation: No N/A Excoriation:  No Induration: No Induration: No Callus: No Callus: No Crepitus: No Crepitus: No Rash: No Rash: No Scarring: No Periwound Skin Dry/Scaly: Yes Maceration: No N/A Moisture: Maceration: No Dry/Scaly: No Periwound Skin Color: Mottled: Yes Hemosiderin Staining: Yes N/A Atrophie Blanche: No Atrophie Blanche: No Cyanosis: No Cyanosis: No Ecchymosis: No Ecchymosis: No Erythema: No Erythema: No Hemosiderin Staining: No Mottled: No Pallor: No Pallor: No Rubor: No Rubor: No Temperature: No  Abnormality No Abnormality N/A Tenderness on No No N/A Palpation: Wound Preparation: Ulcer Cleansing: Ulcer Cleansing: N/A Rinsed/Irrigated with Rinsed/Irrigated with Saline Saline Topical Anesthetic Topical Anesthetic Applied: None Applied: None Treatment Notes Electronic Signature(s) Signed: 12/25/2016 5:08:53 PM By: Regan Lemming BSN, RN Kovacevic, Dawana V. ([id number]) Entered By: Regan Lemming on 12/25/2016 10:48:14 Tonner, Sinclaire [name] ([id number]) -------------------------------------------------------------------------------- Multi-Disciplinary Care Plan Details Patient Name: Spratlin, Prakriti V. Date of Service: 12/25/2016 10:15 AM Medical Record Number: [id number] Patient Account Number: [id number] Date of Birth/Sex: May 28, 1941 (76 y.o. Female) Treating RN: Afful, RN, BSN, Velva Harman Primary Care Nataley Bahri: Beverlyn Roux Other Clinician: Referring Singleton Hickox: Beverlyn Roux Treating Arick Mareno/Extender: Tito Dine in Treatment: 7 Active Inactive ` Abuse / Safety / Falls / Self Care Management Nursing Diagnoses: Impaired physical mobility Potential for falls Goals: Patient will remain injury free Date Initiated: 11/06/2016 Target Resolution Date: 12/30/2016 Goal Status: Active Patient/caregiver will verbalize understanding of skin care regimen Date Initiated: 11/06/2016 Target Resolution Date: 12/30/2016 Goal Status: Active Interventions: Assess fall risk on admission and as needed Treatment Activities: Patient referred to home care : 11/06/2016 Notes: ` Nutrition Nursing Diagnoses: Potential for alteratiion in Nutrition/Potential for imbalanced nutrition Goals: Patient/caregiver verbalizes understanding of need to maintain therapeutic glucose control per primary care physician Date Initiated: 11/06/2016 Target Resolution Date: 12/30/2016 Goal Status: Active Interventions: Provide education on elevated blood sugars and impact on wound healing Hassebrock, Aloria V.  ([id number]) Notes: ` Orientation to the Wound Care Program Nursing Diagnoses: Knowledge deficit related to the wound healing center program Goals: Patient/caregiver will verbalize understanding of the [place] Date Initiated: 11/06/2016 Target Resolution Date: 12/30/2016 Goal Status: Active Interventions: Provide education on orientation to the wound center Notes: ` Venous Leg Ulcer Nursing Diagnoses: Actual venous Insuffiency (use after diagnosis is confirmed) Knowledge deficit related to disease process and management Goals: Non-invasive venous studies are completed as ordered Date Initiated: 11/06/2016 Target Resolution Date: 12/30/2016 Goal Status: Active Patient/caregiver will verbalize understanding of disease process and disease management Date Initiated: 11/06/2016 Target Resolution Date: 12/30/2016 Goal Status: Active Interventions: Assess peripheral edema status every visit. Notes: ` Wound/Skin Impairment Nursing Diagnoses: Impaired tissue integrity Knowledge deficit related to smoking impact on wound healing Knowledge deficit related to ulceration/compromised skin integrity Goals: Strong, Zeenat V. ([id number]) Ulcer/skin breakdown will heal within 14 weeks Date Initiated: 11/06/2016 Target Resolution Date: 02/05/2017 Goal Status: Active Interventions: Assess ulceration(s) every visit Treatment Activities: Skin care regimen initiated : 11/06/2016 Notes: Electronic Signature(s) Signed: 12/25/2016 5:08:53 PM By: Regan Lemming BSN, RN Entered By: Regan Lemming on 12/25/2016 10:48:01 Faraj, Abbygayle V. ([id number]) -------------------------------------------------------------------------------- Pain Assessment Details Patient Name: Semrad, Jasline V. Date of Service: 12/25/2016 10:15 AM Medical Record Number: [id number] Patient Account Number: [id number] Date of Birth/Sex: March 23, 1941 (76 y.o. Female) Treating RN: Afful, RN, BSN, Velva Harman Primary Care Kaidynce Pfister: Beverlyn Roux Other Clinician: Referring Havish Petties: Beverlyn Roux Treating Bryley Kovacevic/Extender: Tito Dine in Treatment: 7 Active Problems Location of Pain Severity and Description of Pain Patient Has Paino Yes Site Locations Pain Location: Pain in Ulcers With Dressing Change: Yes Rate  the pain. Current Pain Level: 2 Character of Pain Describe the Pain: Tender Pain Management and Medication Current Pain Management: Medication: Yes How does your wound impact your activities of daily livingo Sleep: Yes Emotions: Yes Electronic Signature(s) Signed: 12/25/2016 5:08:53 PM By: Regan Lemming BSN, RN Entered By: Regan Lemming on 12/25/2016 10:20:03 Armendariz, Lateasha [name] ([id number]) -------------------------------------------------------------------------------- Patient/Caregiver Education Details Patient Name: Oviedo, Pantera V. Date of Service: 12/25/2016 10:15 AM Medical Record Patient Account Number: [id number] Number: Treating RN: Baruch Gouty, RN, BSN, Velva Harman [phone] y.o. Other Clinician: Date of Birth/Gender: Female) Treating ROBSON, Oelrichs Primary Care Physician: Beverlyn Roux Physician/Extender: G Referring Physician: Dorthula Nettles in Treatment: 7 Education Assessment Education Provided To: Patient Education Topics Provided Elevated Blood Sugar/ Impact on Healing: Methods: Explain/Verbal Responses: State content correctly Welcome To [facility] [place]: Methods: Explain/Verbal Responses: State content correctly Wound Debridement: Methods: Explain/Verbal Responses: State content correctly Wound/Skin Impairment: Methods: Explain/Verbal Responses: State content correctly Electronic Signature(s) Signed: 12/25/2016 5:08:53 PM By: Regan Lemming BSN, RN Entered By: Regan Lemming on 12/25/2016 11:10:57 Newson, Lynnda V. ([id number]) -------------------------------------------------------------------------------- Wound Assessment Details Patient Name: Taglieri, Lynnex V. Date  of Service: 12/25/2016 10:15 AM Medical Record Number: [id number] Patient Account Number: [id number] Date of Birth/Sex: [birth year] (76 y.o. Female) Treating RN: Afful, RN, BSN, Administrator, sports Primary Care Shamaine Mulkern: Beverlyn Roux Other Clinician: Referring Alaisha Eversley: Beverlyn Roux Treating Rhodesia Stanger/Extender: Tito Dine in Treatment: 7 Wound Status Wound Number: 5 Primary Diabetic Wound/Ulcer of the Lower Etiology: Extremity Wound Location: Right Lower Leg - Distal Wound Healed - Epithelialized Wounding Event: Gradually Appeared Status: Date Acquired: 09/09/2016 Comorbid Cataracts, Chronic sinus Weeks Of Treatment: 7 History: problems/congestion, Congestive Heart Clustered Wound: No Failure, Hypertension, Type II Diabetes Photos Photo Uploaded By: Regan Lemming on 12/25/2016 17:04:58 Wound Measurements Length: (cm) 0 % Reduction in Width: (cm) 0 % Reduction in Depth: (cm) 0 Epithelializat Area: (cm) 0 Tunneling: Volume: (cm) 0 Undermining: Area: 100% Volume: 100% ion: Large (67-100%) No No Wound Description Classification: Grade 1 Foul Odor Aft Wound Margin: Flat and Intact Slough/Fibrin Exudate Amount: None Present er Cleansing: No o No Wound Bed Granulation Amount: None Present (0%) Exposed Structure Necrotic Amount: None Present (0%) Fascia Exposed: No Fat Layer (Subcutaneous Tissue) Exposed: No Tendon Exposed: No Muscle Exposed: No Joint Exposed: No Long, Devaeh V. ([id number]) Bone Exposed: No Periwound Skin Texture Texture Color No Abnormalities Noted: No No Abnormalities Noted: No Callus: No Atrophie Blanche: No Crepitus: No Cyanosis: No Excoriation: No Ecchymosis: No Induration: No Erythema: No Rash: No Hemosiderin Staining: Yes Scarring: Yes Mottled: No Pallor: No Moisture Rubor: No No Abnormalities Noted: No Dry / Scaly: Yes Temperature / Pain Maceration: No Temperature: No Abnormality Wound Preparation Ulcer Cleansing: Rinsed/Irrigated  with Saline Topical Anesthetic Applied: None Electronic Signature(s) Signed: 12/25/2016 5:08:53 PM By: Regan Lemming BSN, RN Entered By: Regan Lemming on 12/25/2016 10:46:12 Mclean, Kaisha V. ([id number]) -------------------------------------------------------------------------------- Wound Assessment Details Patient Name: Vierra, Syrenity V. Date of Service: 12/25/2016 10:15 AM Medical Record Number: [id number] Patient Account Number: [id number] Date of Birth/Sex: 20-Feb-1941 (77 y.o. Female) Treating RN: Afful, RN, BSN, Velva Harman Primary Care Robbin Loughmiller: Beverlyn Roux Other Clinician: Referring Chetara Kropp: Beverlyn Roux Treating Velna Hedgecock/Extender: Tito Dine in Treatment: 7 Wound Status Wound Number: 6 Primary Diabetic Wound/Ulcer of the Lower Etiology: Extremity Wound Location: Right Lower Leg - Proximal Wound Open Wounding Event: Gradually Appeared Status: Date Acquired: 09/09/2016 Comorbid Cataracts, Chronic sinus Weeks Of Treatment: 7 History: problems/congestion, Congestive Heart Clustered Wound: No Failure, Hypertension, Type II Diabetes  Photos Photo Uploaded By: Regan Lemming on 12/25/2016 17:05:30 Wound Measurements Length: (cm) 0.7 Width: (cm) 0.3 Depth: (cm) 0.1 Area: (cm) 0.165 Volume: (cm) 0.016 % Reduction in Area: 47.5% % Reduction in Volume: 48.4% Epithelialization: Medium (34-66%) Tunneling: No Undermining: No Wound Description Classification: Grade 2 Foul Odor Aft Wound Margin: Flat and Intact Slough/Fibrin Exudate Amount: Medium Exudate Type: Serosanguineous Mcdaid, Kaitrin V. ([id number]) er Cleansing: No o No Exudate Color: red, brown Wound Bed Granulation Amount: Large (67-100%) Exposed Structure Granulation Quality: Red, Friable Fascia Exposed: No Necrotic Amount: None Present (0%) Fat Layer (Subcutaneous Tissue) Exposed: Yes Tendon Exposed: No Muscle Exposed: No Joint Exposed: No Bone Exposed: No Periwound Skin Texture Texture Color No  Abnormalities Noted: No No Abnormalities Noted: No Callus: No Atrophie Blanche: No Crepitus: No Cyanosis: No Excoriation: No Ecchymosis: No Induration: No Erythema: No Rash: No Hemosiderin Staining: Yes Scarring: No Mottled: Yes Pallor: No Moisture Rubor: No No Abnormalities Noted: No Dry / Scaly: No Temperature / Pain Maceration: No Temperature: No Abnormality Wound Preparation Ulcer Cleansing: Rinsed/Irrigated with Saline Topical Anesthetic Applied: None Treatment Notes Wound #6 (Right, Proximal Lower Leg) 1. Cleansed with: Cleanse wound with antibacterial soap and water 3. Peri-wound Care: Moisturizing lotion 4. Dressing Applied: Prisma Ag 5. Secondary Dressing Applied Dry Gauze Kerlix/Conform 7. Secured with Recruitment consultant) Signed: 12/25/2016 5:08:53 PM By: Regan Lemming BSN, RN Entered By: Regan Lemming on 12/25/2016 10:38:06 Guaman, ZOEIE RITTER ([id number]) Cleverly, Rose VMarland Kitchen ([id number]) -------------------------------------------------------------------------------- Wound Assessment Details Patient Name: Fernandez, Nan V. Date of Service: 12/25/2016 10:15 AM Medical Record Number: [id number] Patient Account Number: [id number] Date of Birth/Sex: 1940-08-25 (76 y.o. Female) Treating RN: Afful, RN, BSN, [employer] Primary Care Emet Rafanan: Beverlyn Roux Other Clinician: Referring Lurlean Kernen: Beverlyn Roux Treating Maggie Dworkin/Extender: Tito Dine in Treatment: 7 Wound Status Wound Number: 7 Primary Diabetic Wound/Ulcer of the Lower Etiology: Extremity Wound Location: Right, Dorsal Metatarsal head first Wound Open Status: Wounding Event: Gradually Appeared Comorbid Cataracts, Chronic sinus Date Acquired: 08/06/2013 History: problems/congestion, Congestive Heart Weeks Of Treatment: 7 Failure, Hypertension, Type II Diabetes Clustered Wound: No Photos Photo Uploaded By: Regan Lemming on 12/25/2016 17:05:30 Wound Measurements Length: (cm) 2.4 Width: (cm)  0.8 Depth: (cm) 1 Area: (cm) 1.508 Volume: (cm) 1.508 % Reduction in Area: -28% % Reduction in Volume: -327.2% Epithelialization: Small (1-33%) Tunneling: No Undermining: No Wound Description Classification: Grade 2 Wound Margin: Flat and Intact Exudate Amount: Large Exudate Type: Serous Exudate Color: amber Foul Odor After Cleansing: No Slough/Fibrino Yes Wound Bed Granulation Amount: Medium (34-66%) Exposed Structure Granulation Quality: Pink, Friable Fascia Exposed: No Necrotic Amount: Medium (34-66%) Fat Layer (Subcutaneous Tissue) Exposed: Yes Stephanie, Myleka V. ([id number]) Necrotic Quality: Adherent Slough Tendon Exposed: No Muscle Exposed: No Joint Exposed: No Bone Exposed: No Periwound Skin Texture Texture Color No Abnormalities Noted: No No Abnormalities Noted: No Induration: Yes Ecchymosis: Yes Scarring: Yes Hemosiderin Staining: Yes Mottled: Yes Moisture No Abnormalities Noted: No Temperature / Pain Maceration: Yes Temperature: No Abnormality Tenderness on Palpation: Yes Wound Preparation Ulcer Cleansing: Rinsed/Irrigated with Saline Topical Anesthetic Applied: Other: lidociane 4%, Treatment Notes Wound #7 (Right, Dorsal Metatarsal head first) 1. Cleansed with: Cleanse wound with antibacterial soap and water 3. Peri-wound Care: Moisturizing lotion 4. Dressing Applied: Prisma Ag 5. Secondary Dressing Applied Dry Gauze Kerlix/Conform 7. Secured with Recruitment consultant) Signed: 12/25/2016 5:08:53 PM By: Regan Lemming BSN, RN Entered By: Regan Lemming on 12/25/2016 10:50:48 Valdivia, Latarshia [name] ([id number]) -------------------------------------------------------------------------------- Wound Assessment Details Patient Name: Lippman, Panhia V. Date of Service:  12/25/2016 10:15 AM Medical Record Number: [id number] Patient Account Number: [id number] Date of Birth/Sex: Oct 18, 1940 (76 y.o. Female) Treating RN: Afful, RN, BSN, [employer] Primary Care  Sarayah Bacchi: Beverlyn Roux Other Clinician: Referring Glenna Brunkow: Beverlyn Roux Treating Yusuf Yu/Extender: Tito Dine in Treatment: 7 Wound Status Wound Number: 8 Primary Diabetic Wound/Ulcer of the Lower Etiology: Extremity Wound Location: Right Toe Third - Dorsal Wound Healed - Epithelialized Wounding Event: Gradually Appeared Status: Date Acquired: 10/28/2016 Comorbid Cataracts, Chronic sinus Weeks Of Treatment: 7 History: problems/congestion, Congestive Heart Clustered Wound: No Failure, Hypertension, Type II Diabetes Photos Photo Uploaded By: Regan Lemming on 12/25/2016 17:06:07 Wound Measurements Length: (cm) 0 % Reduction in Width: (cm) 0 % Reduction in Depth: (cm) 0 Epithelializat Area: (cm) 0 Tunneling: Volume: (cm) 0 Undermining: Area: 100% Volume: 100% ion: Large (67-100%) No No Wound Description Classification: Grade 1 Foul Odor Aft Wound Margin: Flat and Intact Slough/Fibrin Exudate Amount: None Present er Cleansing: No o No Wound Bed Granulation Amount: Large (67-100%) Exposed Structure Granulation Quality: Friable Fascia Exposed: No Necrotic Amount: None Present (0%) Fat Layer (Subcutaneous Tissue) Exposed: No Tendon Exposed: No Muscle Exposed: No Joint Exposed: No Craze, Shay V. ([id number]) Bone Exposed: No Periwound Skin Texture Texture Color No Abnormalities Noted: No No Abnormalities Noted: No Callus: No Atrophie Blanche: No Crepitus: No Cyanosis: No Excoriation: No Ecchymosis: No Induration: No Erythema: No Rash: No Hemosiderin Staining: No Scarring: Yes Mottled: Yes Pallor: No Moisture Rubor: No No Abnormalities Noted: No Dry / Scaly: Yes Temperature / Pain Maceration: No Temperature: No Abnormality Wound Preparation Ulcer Cleansing: Rinsed/Irrigated with Saline Topical Anesthetic Applied: None Electronic Signature(s) Signed: 12/25/2016 5:08:53 PM By: Regan Lemming BSN, RN Entered By: Regan Lemming on 12/25/2016  10:47:04 Zetina, Tris V. ([id number]) -------------------------------------------------------------------------------- Wound Assessment Details Patient Name: Westerfeld, Mairyn V. Date of Service: 12/25/2016 10:15 AM Medical Record Number: [id number] Patient Account Number: [id number] Date of Birth/Sex: 27-Jul-1941 (76 y.o. Female) Treating RN: Afful, RN, BSN, Administrator, sports Primary Care Truman Aceituno: Beverlyn Roux Other Clinician: Referring Roberth Berling: Beverlyn Roux Treating Lynelle Weiler/Extender: Tito Dine in Treatment: 7 Wound Status Wound Number: 9 Primary Diabetic Wound/Ulcer of the Lower Etiology: Extremity Wound Location: Right Foot - Lateral, Distal Wound Healed - Epithelialized Wounding Event: Gradually Appeared Status: Date Acquired: 10/14/2016 Comorbid Cataracts, Chronic sinus Weeks Of Treatment: 7 History: problems/congestion, Congestive Heart Clustered Wound: No Failure, Hypertension, Type II Diabetes Photos Photo Uploaded By: Regan Lemming on 12/25/2016 17:06:08 Wound Measurements Length: (cm) 0 % Reductio Width: (cm) 0 % Reductio Depth: (cm) 0 Epithelial Area: (cm) 0 Tunneling Volume: (cm) 0 Undermini n in Area: 100% n in Volume: 100% ization: Large (67-100%) : No ng: No Wound Description Classification: Grade 1 Wound Margin: Indistinct, nonvisible Exudate Amount: None Present Foul Odor After Cleansing: No Slough/Fibrino No Wound Bed Granulation Amount: Large (67-100%) Exposed Structure Granulation Quality: Friable Fascia Exposed: No Necrotic Amount: None Present (0%) Fat Layer (Subcutaneous Tissue) Exposed: No Tendon Exposed: No Muscle Exposed: No Joint Exposed: No Barfoot, Kaleya V. ([id number]) Bone Exposed: No Periwound Skin Texture Texture Color No Abnormalities Noted: No No Abnormalities Noted: No Callus: No Atrophie Blanche: No Crepitus: No Cyanosis: No Excoriation: No Ecchymosis: No Induration: No Erythema: No Rash: No Hemosiderin Staining:  Yes Scarring: No Mottled: No Pallor: No Moisture Rubor: No No Abnormalities Noted: No Dry / Scaly: No Temperature / Pain Maceration: No Temperature: No Abnormality Wound Preparation Ulcer Cleansing: Rinsed/Irrigated with Saline Topical Anesthetic Applied: None Electronic Signature(s) Signed: 12/25/2016 5:08:53 PM By: Regan Lemming BSN, RN Entered  ByRegan Lemming on 12/25/2016 10:47:45 Kidney, Payal [name] ([id number]) -------------------------------------------------------------------------------- Vitals Details Patient Name: Purrington, Ellizabeth V. Date of Service: 12/25/2016 10:15 AM Medical Record Number: [id number] Patient Account Number: [id number] Date of Birth/Sex: 07-Apr-1941 (76 y.o. Female) Treating RN: Afful, RN, BSN, Velva Harman Primary Care Uriel Horkey: Beverlyn Roux Other Clinician: Referring Trish Mancinelli: Beverlyn Roux Treating Kden Wagster/Extender: Tito Dine in Treatment: 7 Vital Signs Time Taken: 10:20 Temperature (F): 97.7 Height (in): 64 Pulse (bpm): 74 Weight (lbs): 200 Respiratory Rate (breaths/min): 18 Body Mass Index (BMI): 34.3 Blood Pressure (mmHg): 136/46 Capillary Blood Glucose (mg/dl): 165 Reference Range: 80 - 120 mg / dl Electronic Signature(s) Signed: 12/25/2016 5:08:53 PM By: Regan Lemming BSN, RN Entered By: Regan Lemming on 12/25/2016 10:25:54

## 2017-01-01 ENCOUNTER — Encounter: Payer: Medicare HMO | Admitting: Internal Medicine

## 2017-01-01 ENCOUNTER — Ambulatory Visit
Admission: RE | Admit: 2017-01-01 | Discharge: 2017-01-01 | Disposition: A | Discharge status: Home / Self Care | Payer: Medicare HMO | Source: Ambulatory Visit | Attending: Internal Medicine | Admitting: Internal Medicine

## 2017-01-01 ENCOUNTER — Other Ambulatory Visit: Payer: Self-pay | Admitting: Internal Medicine

## 2017-01-01 DIAGNOSIS — E11622 Type 2 diabetes mellitus with other skin ulcer: Secondary | ICD-10-CM | POA: Diagnosis not present

## 2017-01-01 DIAGNOSIS — E119 Type 2 diabetes mellitus without complications: Secondary | ICD-10-CM | POA: Diagnosis present

## 2017-01-01 DIAGNOSIS — S81801A Unspecified open wound, right lower leg, initial encounter: Secondary | ICD-10-CM

## 2017-01-02 NOTE — Progress Notes (Addendum)
TINLEE, NAVARRETTE ([id number]) Visit Report for 01/01/2017 Arrival Information Details Patient Name: [name], [name] Date of Service: 01/01/2017 11:15 AM Medical Record Number: [id number] Patient Account Number: [id number] Date of Birth/Sex: Dec 22, 1940 (76 y.o. Female) Treating RN: Afful, RN, BSN, Velva Harman Primary Care Raji Glinski: Beverlyn Roux Other Clinician: Referring Dakwon Wenberg: Beverlyn Roux Treating Lianna Sitzmann/Extender: Tito Dine in Treatment: 8 Visit Information History Since Last Visit All ordered tests and consults were completed: No Patient Arrived: Wheel Chair Added or deleted any medications: No Arrival Time: 11:24 Any new allergies or adverse reactions: No Accompanied By: hubby Had a fall or experienced change in No activities of daily living that may affect Transfer Assistance: Manual risk of falls: Patient Identification Verified: Yes Signs or symptoms of abuse/neglect since last No Secondary Verification Process Yes visito Completed: Hospitalized since last visit: No Patient Requires Transmission-Based No Has Dressing in Place as Prescribed: Yes Precautions: Pain Present Now: No Patient Has Alerts: No Electronic Signature(s) Signed: 01/01/2017 11:24:41 AM By: Regan Lemming BSN, RN Entered By: Regan Lemming on 01/01/2017 11:24:40 Tsui, Shaletha [name] ([id number]) -------------------------------------------------------------------------------- Encounter Discharge Information Details Patient Name: [name], [name] Date of Service: 01/01/2017 11:15 AM Medical Record Number: [id number] Patient Account Number: [id number] Date of Birth/Sex: Oct 02, 1940 (76 y.o. Female) Treating RN: Afful, RN, BSN, Velva Harman Primary Care Camauri Fleece: Beverlyn Roux Other Clinician: Referring Corine Solorio: Beverlyn Roux Treating Hinton Luellen/Extender: Tito Dine in Treatment: 8 Encounter Discharge Information Items Discharge Pain Level: 0 Discharge Condition: Stable Ambulatory Status:  Wheelchair Discharge Destination: Home Transportation: Private Auto Accompanied By: husband Schedule Follow-up Appointment: No Medication Reconciliation completed and provided to Patient/Care No Jeroline Wolbert: Provided on Clinical Summary of Care: 01/01/2017 Form Type Recipient Paper Patient [facility] Electronic Signature(s) Signed: 01/01/2017 5:12:00 PM By: Regan Lemming BSN, RN Previous Signature: 01/01/2017 11:59:44 AM Version By: Ruthine Dose Entered By: Regan Lemming on 01/01/2017 17:12:00 Birchler, [name] ([id number]) -------------------------------------------------------------------------------- Lower Extremity Assessment Details Patient Name: Tomerlin, [name] Date of Service: 01/01/2017 11:15 AM Medical Record Number: [id number] Patient Account Number: [id number] Date of Birth/Sex: 1940-08-16 (76 y.o. Female) Treating RN: Afful, RN, BSN, Administrator, sports Primary Care Glendene Wyer: Beverlyn Roux Other Clinician: Referring Bynum Mccullars: Beverlyn Roux Treating Rube Sanchez/Extender: Tito Dine in Treatment: 8 Edema Assessment Assessed: [Left: No] [Right: No] Edema: [Left: Ye] [Right: s] Calf Left: Right: Point of Measurement: 34 cm From Medial Instep cm 44.3 cm Ankle Left: Right: Point of Measurement: 11 cm From Medial Instep cm 27.8 cm Vascular Assessment Claudication: Claudication Assessment [Right:None] Pulses: Dorsalis Pedis Palpable: [Right:Yes] Posterior Tibial Extremity colors, hair growth, and conditions: Extremity Color: [Right:Mottled] Hair Growth on Extremity: [Right:Yes] Temperature of Extremity: [Right:Warm] Capillary Refill: [Right:< 3 seconds] Toe Nail Assessment Left: Right: Thick: Yes Discolored: Yes Deformed: No Improper Length and Hygiene: No Electronic Signature(s) Signed: 01/01/2017 5:20:40 PM By: Regan Lemming BSN, RN Entered By: Regan Lemming on 01/01/2017 11:29:01 [name], [name] ([id number]) [name], [name]  ([id number]) -------------------------------------------------------------------------------- Multi Wound Chart Details Patient Name: [name], [name] Date of Service: 01/01/2017 11:15 AM Medical Record Number: [id number] Patient Account Number: [id number] Date of Birth/Sex: 16-Oct-1940 (76 y.o. Female) Treating RN: Afful, RN, BSN, Velva Harman Primary Care Claud Gowan: Beverlyn Roux Other Clinician: Referring Kyiah Canepa: Beverlyn Roux Treating Ashtan Laton/Extender: Tito Dine in Treatment: 8 Vital Signs Height(in): 64 Pulse(bpm): 74 Weight(lbs): 200 Blood Pressure 132/37 (mmHg): Body Mass Index(BMI): 34 Temperature(F): 97.5 Respiratory Rate 17 (breaths/min): Photos: [6:No Photos] [7:No Photos] [N/A:N/A] Wound Location: [6:Right Lower Leg - Proximal] [7:Right Metatarsal head first N/A - Dorsal] Wounding  Event: [6:Gradually Appeared] [7:Gradually Appeared] [N/A:N/A] Primary Etiology: [6:Diabetic Wound/Ulcer of the Lower Extremity] [7: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] Comorbid History: [6:Cataracts, Chronic sinus problems/congestion, Congestive Heart Failure, Hypertension, Type II Diabetes] [7:Cataracts, Chronic sinus N/A problems/congestion, Congestive Heart Failure, Hypertension, Type II Diabetes] Date Acquired: [6:09/09/2016] [7:08/06/2013] [N/A:N/A] Weeks of Treatment: [6:8] [7:8] [N/A:N/A] Wound Status: [6:Open] [7:Open] [N/A:N/A] Measurements L x W x D 0.6x0.3x0.1 [7:2.5x1x0.5] [N/A:N/A] (cm) Area (cm) : [6:0.141] [7:1.963] [N/A:N/A] Volume (cm) : [6:0.014] [7:0.982] [N/A:N/A] % Reduction in Area: [6:55.10%] [7:-66.60%] [N/A:N/A] % Reduction in Volume: 54.80% [7:-178.20%] [N/A:N/A] Classification: [6:Grade 2] [7:Grade 2] [N/A:N/A] Exudate Amount: [6:Medium] [7:Large] [N/A:N/A] Exudate Type: [6:Serosanguineous] [7:Serous] [N/A:N/A] Exudate Color: [6:red, brown] [7:amber] [N/A:N/A] Wound Margin: [6:Flat and Intact] [7:Flat and Intact] [N/A:N/A] Granulation Amount:  [6:Large (67-100%)] [7:Medium (34-66%)] [N/A:N/A] Granulation Quality: [6:Red, Friable] [7:Pink, Friable] [N/A:N/A] Necrotic Amount: [6:None Present (0%)] [7:Medium (34-66%)] [N/A:N/A] Exposed Structures: [6:Fat Layer (Subcutaneous Tissue) Exposed: Yes] [7:Fat Layer (Subcutaneous Tissue) Exposed: Yes] [N/A:N/A] Fascia: No Fascia: No Tendon: No Tendon: No Muscle: No Muscle: No Joint: No Joint: No Bone: No Bone: No Epithelialization: Medium (34-66%) Small (1-33%) N/A Debridement: N/A Debridement (10626- N/A 11047) Pre-procedure N/A 11:47 N/A Verification/Time Out Taken: Pain Control: N/A Lidocaine 4% Topical N/A Solution Tissue Debrided: N/A Fibrin/Slough, Fat, N/A Subcutaneous Level: N/A Skin/Subcutaneous N/A Tissue Debridement Area (sq N/A 2.5 N/A cm): Instrument: N/A Curette N/A Bleeding: N/A Large N/A Hemostasis Achieved: N/A Pressure N/A Procedural Pain: N/A 0 N/A Post Procedural Pain: N/A 0 N/A Debridement Treatment N/A Procedure was tolerated N/A Response: well Post Debridement N/A 2.5x1x0.5 N/A Measurements L x W x D (cm) Post Debridement N/A 0.982 N/A Volume: (cm) Periwound Skin Texture: Excoriation: No Induration: Yes N/A Induration: No Scarring: Yes Callus: No Crepitus: No Rash: No Scarring: No Periwound Skin Maceration: No Maceration: Yes N/A Moisture: Dry/Scaly: No Periwound Skin Color: Hemosiderin Staining: Yes Ecchymosis: Yes N/A Mottled: Yes Hemosiderin Staining: Yes Atrophie Blanche: No Mottled: Yes Cyanosis: No Ecchymosis: No Erythema: No Pallor: No Rubor: No Temperature: No Abnormality No Abnormality N/A Tenderness on No Yes N/A Palpation: Stevison, Beaulah V. ([id number]) Wound Preparation: Ulcer Cleansing: Ulcer Cleansing: N/A Rinsed/Irrigated with Rinsed/Irrigated with Saline Saline Topical Anesthetic Topical Anesthetic Applied: None Applied: Other: lidociane 4% Procedures Performed: N/A Debridement N/A Treatment  Notes Electronic Signature(s) Signed: 01/01/2017 4:44:14 PM By: Linton Ham MD Entered By: Linton Ham on 01/01/2017 12:36:14 Kimoto, Creedence [name] ([id number]) -------------------------------------------------------------------------------- Multi-Disciplinary Care Plan Details Patient Name: [name], [name] Date of Service: 01/01/2017 11:15 AM Medical Record Number: [id number] Patient Account Number: [id number] Date of Birth/Sex: 02-28-1941 (76 y.o. Female) Treating RN: Afful, RN, BSN, Velva Harman Primary Care Leler Brion: Beverlyn Roux Other Clinician: Referring Holleigh Crihfield: Beverlyn Roux Treating Lillyanna Glandon/Extender: Tito Dine in Treatment: 8 Active Inactive ` Abuse / Safety / Falls / Self Care Management Nursing Diagnoses: Impaired physical mobility Potential for falls Goals: Patient will remain injury free Date Initiated: 11/06/2016 Target Resolution Date: 12/30/2016 Goal Status: Active Patient/caregiver will verbalize understanding of skin care regimen Date Initiated: 11/06/2016 Target Resolution Date: 12/30/2016 Goal Status: Active Interventions: Assess fall risk on admission and as needed Treatment Activities: Patient referred to home care : 11/06/2016 Notes: ` Nutrition Nursing Diagnoses: Potential for alteratiion in Nutrition/Potential for imbalanced nutrition Goals: Patient/caregiver verbalizes understanding of need to maintain therapeutic glucose control per primary care physician Date Initiated: 11/06/2016 Target Resolution Date: 12/30/2016 Goal Status: Active Interventions: Provide education on elevated blood sugars and impact on wound healing Blackie, Navia V. ([id number]) Notes: ` Orientation to the  Wound Care Program Nursing Diagnoses: Knowledge deficit related to the wound healing center program Goals: Patient/caregiver will verbalize understanding of the [facility] Date Initiated: 11/06/2016 Target Resolution Date: 12/30/2016 Goal Status:  Active Interventions: Provide education on orientation to the wound center Notes: ` Venous Leg Ulcer Nursing Diagnoses: Actual venous Insuffiency (use after diagnosis is confirmed) Knowledge deficit related to disease process and management Goals: Non-invasive venous studies are completed as ordered Date Initiated: 11/06/2016 Target Resolution Date: 12/30/2016 Goal Status: Active Patient/caregiver will verbalize understanding of disease process and disease management Date Initiated: 11/06/2016 Target Resolution Date: 12/30/2016 Goal Status: Active Interventions: Assess peripheral edema status every visit. Notes: ` Wound/Skin Impairment Nursing Diagnoses: Impaired tissue integrity Knowledge deficit related to smoking impact on wound healing Knowledge deficit related to ulceration/compromised skin integrity Goals: [name], [name] ([id number]) Ulcer/skin breakdown will heal within 14 weeks Date Initiated: 11/06/2016 Target Resolution Date: 02/05/2017 Goal Status: Active Interventions: Assess ulceration(s) every visit Treatment Activities: Skin care regimen initiated : 11/06/2016 Notes: Electronic Signature(s) Signed: 01/01/2017 5:20:40 PM By: Regan Lemming BSN, RN Entered By: Regan Lemming on 01/01/2017 11:49:51 [name], [name] ([id number]) -------------------------------------------------------------------------------- Pain Assessment Details Patient Name: Razavi, Denasia V. Date of Service: 01/01/2017 11:15 AM Medical Record Number: [id number] Patient Account Number: [id number] Date of Birth/Sex: 1941-03-30 (76 y.o. Female) Treating RN: Afful, RN, BSN, Velva Harman Primary Care Zadie Deemer: Beverlyn Roux Other Clinician: Referring Delle Andrzejewski: Beverlyn Roux Treating Jeffory Snelgrove/Extender: Tito Dine in Treatment: 8 Active Problems Location of Pain Severity and Description of Pain Patient Has Paino No Site Locations With Dressing Change: No Pain Management and Medication Current Pain  Management: Electronic Signature(s) Signed: 01/01/2017 5:20:40 PM By: Regan Lemming BSN, RN Entered By: Regan Lemming on 01/01/2017 11:26:58 [name], [name] ([id number]) -------------------------------------------------------------------------------- Patient/Caregiver Education Details Patient Name: [name], [name] Date of Service: 01/01/2017 11:15 AM Medical Record Patient Account Number: [id number] Number: Treating RN: Baruch Gouty, RN, BSN, Velva Harman [id number] y.o. Other Clinician: Date of Birth/Gender: Female) Treating ROBSON, Courtland Primary Care Physician: Beverlyn Roux Physician/Extender: G Referring Physician: Dorthula Nettles in Treatment: 8 Education Assessment Education Provided To: Patient and Caregiver husband Education Topics Provided Elevated Blood Sugar/ Impact on Healing: Methods: Explain/Verbal Responses: State content correctly Welcome To The [place]: Methods: Explain/Verbal Responses: State content correctly Electronic Signature(s) Signed: 01/01/2017 5:20:40 PM By: Regan Lemming BSN, RN Entered By: Regan Lemming on 01/01/2017 17:12:31 [name], [name] ([id number]) -------------------------------------------------------------------------------- Wound Assessment Details Patient Name: [name], [name] Date of Service: 01/01/2017 11:15 AM Medical Record Number: [id number] Patient Account Number: [id number] Date of Birth/Sex: Sep 07, 1940 (76 y.o. Female) Treating RN: Afful, RN, BSN, Administrator, sports Primary Care Estela Vinal: Beverlyn Roux Other Clinician: Referring Fritzie Prioleau: Beverlyn Roux Treating Cullen Lahaie/Extender: Tito Dine in Treatment: 8 Wound Status Wound Number: 6 Primary Diabetic Wound/Ulcer of the Lower Etiology: Extremity Wound Location: Right Lower Leg - Proximal Wound Open Wounding Event: Gradually Appeared Status: Date Acquired: 09/09/2016 Comorbid Cataracts, Chronic sinus Weeks Of Treatment: 8 History: problems/congestion, Congestive  Heart Clustered Wound: No Failure, Hypertension, Type II Diabetes Photos Photo Uploaded By: Regan Lemming on 01/01/2017 17:00:12 Wound Measurements Length: (cm) 0.6 Width: (cm) 0.3 Depth: (cm) 0.1 Area: (cm) 0.141 Volume: (cm) 0.014 % Reduction in Area: 55.1% % Reduction in Volume: 54.8% Epithelialization: Medium (34-66%) Tunneling: No Undermining: No Wound Description Classification: Grade 2 Foul Odor Aft Wound Margin: Flat and Intact Slough/Fibrin Exudate Amount: Medium Exudate Type: Serosanguineous Exudate Color: red, brown er Cleansing: No o No Wound Bed Granulation Amount: Large (  67-100%) Exposed Structure Granulation Quality: Red, Friable Fascia Exposed: No Necrotic Amount: None Present (0%) Fat Layer (Subcutaneous Tissue) Exposed: Yes Tendon Exposed: No Banfill, Cheney V. ([id number]) Muscle Exposed: No Joint Exposed: No Bone Exposed: No Periwound Skin Texture Texture Color No Abnormalities Noted: No No Abnormalities Noted: No Callus: No Atrophie Blanche: No Crepitus: No Cyanosis: No Excoriation: No Ecchymosis: No Induration: No Erythema: No Rash: No Hemosiderin Staining: Yes Scarring: No Mottled: Yes Pallor: No Moisture Rubor: No No Abnormalities Noted: No Dry / Scaly: No Temperature / Pain Maceration: No Temperature: No Abnormality Wound Preparation Ulcer Cleansing: Rinsed/Irrigated with Saline Topical Anesthetic Applied: None Treatment Notes Wound #6 (Right, Proximal Lower Leg) 1. Cleansed with: Cleanse wound with antibacterial soap and water 3. Peri-wound Care: Moisturizing lotion 4. Dressing Applied: Prisma Ag 5. Secondary Dressing Applied Dry Gauze Kerlix/Conform 7. Secured with Recruitment consultant) Signed: 01/01/2017 5:20:40 PM By: Regan Lemming BSN, RN Entered By: Regan Lemming on 01/01/2017 11:46:45 Cauthon, Dezarae V. ([id number]) -------------------------------------------------------------------------------- Wound  Assessment Details Patient Name: Begin, Ronia V. Date of Service: 01/01/2017 11:15 AM Medical Record Number: [id number] Patient Account Number: [id number] Date of Birth/Sex: 1940-09-25 (76 y.o. Female) Treating RN: Afful, RN, BSN, Administrator, sports Primary Care Ezekiel Menzer: Beverlyn Roux Other Clinician: Referring Florella Mcneese: Beverlyn Roux Treating Millan Legan/Extender: Tito Dine in Treatment: 8 Wound Status Wound Number: 7 Primary Diabetic Wound/Ulcer of the Lower Etiology: Extremity Wound Location: Right Metatarsal head first - Dorsal Wound Open Status: Wounding Event: Gradually Appeared Comorbid Cataracts, Chronic sinus Date Acquired: 08/06/2013 History: problems/congestion, Congestive Heart Weeks Of Treatment: 8 Failure, Hypertension, Type II Diabetes Clustered Wound: No Photos Photo Uploaded By: Regan Lemming on 01/01/2017 17:00:13 Wound Measurements Length: (cm) 2.5 Width: (cm) 1 Depth: (cm) 0.5 Area: (cm) 1.963 Volume: (cm) 0.982 % Reduction in Area: -66.6% % Reduction in Volume: -178.2% Epithelialization: Small (1-33%) Tunneling: No Undermining: No Wound Description Classification: Grade 2 Wound Margin: Flat and Intact Exudate Amount: Large Exudate Type: Serous Exudate Color: amber Foul Odor After Cleansing: No Slough/Fibrino Yes Wound Bed Granulation Amount: Medium (34-66%) Exposed Structure Granulation Quality: Pink, Friable Fascia Exposed: No Necrotic Amount: Medium (34-66%) Fat Layer (Subcutaneous Tissue) Exposed: Yes Puzio, Kaylyne V. ([id number]) Necrotic Quality: Adherent Slough Tendon Exposed: No Muscle Exposed: No Joint Exposed: No Bone Exposed: No Periwound Skin Texture Texture Color No Abnormalities Noted: No No Abnormalities Noted: No Induration: Yes Ecchymosis: Yes Scarring: Yes Hemosiderin Staining: Yes Mottled: Yes Moisture No Abnormalities Noted: No Temperature / Pain Maceration: Yes Temperature: No Abnormality Tenderness on Palpation:  Yes Wound Preparation Ulcer Cleansing: Rinsed/Irrigated with Saline Topical Anesthetic Applied: Other: lidociane 4%, Treatment Notes Wound #7 (Right, Dorsal Metatarsal head first) 1. Cleansed with: Cleanse wound with antibacterial soap and water 3. Peri-wound Care: Moisturizing lotion 4. Dressing Applied: Prisma Ag 5. Secondary Dressing Applied Dry Gauze Kerlix/Conform 7. Secured with Recruitment consultant) Signed: 01/01/2017 5:20:40 PM By: Regan Lemming BSN, RN Entered By: Regan Lemming on 01/01/2017 11:49:39 Lukasiewicz, Daira V. ([id number]) -------------------------------------------------------------------------------- Vitals Details Patient Name: Marquis, Makaria V. Date of Service: 01/01/2017 11:15 AM Medical Record Number: [id number] Patient Account Number: [id number] Date of Birth/Sex: November 22, 1940 (76 y.o. Female) Treating RN: Afful, RN, BSN, Velva Harman Primary Care Marquasia Schmieder: Beverlyn Roux Other Clinician: Referring Kynnadi Dicenso: Beverlyn Roux Treating Talha Iser/Extender: Tito Dine in Treatment: 8 Vital Signs Time Taken: 11:30 Temperature (F): 97.5 Height (in): 64 Pulse (bpm): 74 Weight (lbs): 200 Respiratory Rate (breaths/min): 17 Body Mass Index (BMI): 34.3 Blood Pressure (mmHg): 132/37 Reference Range: 80 - 120 mg /  dl Electronic Signature(s) Signed: 01/01/2017 5:20:40 PM By: Regan Lemming BSN, RN Entered By: Regan Lemming on 01/01/2017 11:31:39

## 2017-01-02 NOTE — Progress Notes (Signed)
RAND, BOLLER ([id number]) Visit Report for 01/01/2017 Chief Complaint Document Details Patient Name: [name], [name] Date of Service: 01/01/2017 11:15 AM Medical Record Patient Account Number: [id number] Number: Treating RN: Baruch Gouty, RN, BSN, Velva Harman 02-25-1941 [phone] y.o. Other Clinician: Date of Birth/Sex: Female) Treating ROBSON, [name] Primary Care Provider: Beverlyn Roux Provider/Extender: G Referring Provider: Dorthula Nettles in Treatment: 8 Information Obtained from: Patient Chief Complaint The patient is here for initial evaluation of RLE and Right foot/toe ulcers Electronic Signature(s) Signed: 01/01/2017 4:44:14 PM By: Linton Ham MD Entered By: Linton Ham on 01/01/2017 12:37:26 [name], [name] ([id number]) -------------------------------------------------------------------------------- Debridement Details Patient Name: White, [name] Date of Service: 01/01/2017 11:15 AM Medical Record Patient Account Number: [id number] Number: Treating RN: Baruch Gouty, RN, BSN, Velva Harman [birth year] [phone] y.o. Other Clinician: Date of Birth/Sex: Female) Treating ROBSON, [name] Primary Care Provider: Beverlyn Roux Provider/Extender: G Referring Provider: Dorthula Nettles in Treatment: 8 Debridement Performed for Wound #7 Right,Dorsal Metatarsal head first Assessment: Performed By: Physician Ricard Dillon, MD Debridement: Debridement Severity of Tissue Pre Fat layer exposed Debridement: Pre-procedure Verification/Time Out Yes - 11:47 Taken: Start Time: 11:47 Pain Control: Lidocaine 4% Topical Solution Level: Skin/Subcutaneous Tissue Total Area Debrided (L x 2.5 (cm) x 1 (cm) = 2.5 (cm) W): Tissue and other Non-Viable, Fat, Fibrin/Slough, Subcutaneous material debrided: Instrument: Curette Bleeding: Large Hemostasis Achieved: Pressure End Time: 11:49 Procedural Pain: 0 Post Procedural Pain: 0 Response to Treatment: Procedure was tolerated well Post  Debridement Measurements of Total Wound Length: (cm) 2.5 Width: (cm) 1 Depth: (cm) 0.5 Volume: (cm) 0.982 Character of Wound/Ulcer Post Requires Further Debridement Debridement: Severity of Tissue Post Debridement: Fat layer exposed Post Procedure Diagnosis Same as Pre-procedure Electronic Signature(s) REBBECA, SHEPERD ([id number]) Signed: 01/01/2017 4:44:14 PM By: Linton Ham MD Signed: 01/01/2017 5:20:40 PM By: Regan Lemming BSN, RN Entered By: Linton Ham on 01/01/2017 12:36:22 [name], [name] ([id number]) -------------------------------------------------------------------------------- HPI Details Patient Name: White, [name] Date of Service: 01/01/2017 11:15 AM Medical Record Patient Account Number: [id number] Number: Treating RN: Baruch Gouty, RN, BSN, Velva Harman [birth year] [phone] y.o. Other Clinician: Date of Birth/Sex: Female) Treating ROBSON, [name] Primary Care Provider: Beverlyn Roux Provider/Extender: G Referring Provider: Dorthula Nettles in Treatment: 8 History of Present Illness HPI Description: 08/13/16: This is a 76 year old woman who came predominantly for review of 3 cm in diameter circular wound to the left anterior lateral leg. She was in the ER on 08/01/16 I reviewed their notes. There was apparently pus coming out of the wound at that time and the patient arrived requesting debridement which they don't do in the emergency room. Nevertheless I can'[name] see that they did any x-rays. There were no cultures done. She is a type II diabetic and I a note after the patient was in the clinic that she had a bypass graft from the popliteal to the tibial on the right on 02/28/16. She also had a right greater saphenous vein harvest on the same date for arterial bypass. She is going to have vascular studies including ABIs [name] ABIs on the right on 08/28/16. The patient's surgery was on 02/28/16 by Dr. Vallarie Mare she had a right below the knee popliteal artery to peroneal artery bypass  with reverse greater saphenous vein and an endarterectomy of the mid segment peroneal artery. Postoperatively she had a strong mild monophasic peroneal signal with a pink foot. It would appear that the patient is had some nonhealing in the surgical saphenous vein harvest site on the left leg.  [facility]prisingly [facility] I cannot see much information about this at all. Dr. Lucious Groves notes from 05/29/16 show that the patient's wounds "are not healed" the right first metatarsal wound healed but then opened back up. 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 At that point the patient had a wound VAC to her right calf with regards to the wounds on her dorsal right toe would appear that these are felt to be arterial wounds. She has had surgery on the metatarsal phalangeal in 2015 I Dr. Doran Durand secondary to a right metatarsal phalangeal joint fracture. She is apparently had discoloration around this area since then. 08/28/16; patient arrives with her wounds in much the same condition. The linear vein harvest site and the circular wound below it which I think was a blister. She also has to probing holes in her right great toe and a necrotic eschar on the right second toe. Because of these being arterial wounds I reduced her compression from 3-2 layers this seems to of done satisfactorily she has not had any problems. I cannot see that she is actually had an x-ray ====== 11/06/16 the patient comes in for evaluation of her right lower extremity ulcers. She was here in January 2018 for 2 visits subsequently ended up in the hospital with pneumonia and then to rehabilitation. She has now been discharged from rehabilitation and is home. She has multiple ulcerations to her right lower extremity including the foot and toes. She does have home health in place and they have been placing alginate to  the ulcers. She is followed by Dr. Bridgett Larsson of vascular medicine. 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 She recently saw him on 3/23. In office ABIs were: Angst, MICALA SALTSMAN. ([id number]) Right 0.48 with monophasic flow to the DP, PT, peroneal Left 0.63 with monophasic flow to the DP and PT Her arterial studies indicated a patent right below knee popliteal to peroneal bypass She had an MRI in February 2008 that was negative frosty myelitis but this showed general soft tissue edema in the right foot and lower extremity concerning for cellulitis She is a diabetic, managed with insulin. Her hemoglobin A1c in December 2017 was 8.4 which is a trend up from previous levels. She had blood work in February 2018 which revealed an albumin of 2.6 this appears to be relatively acute as an albumin in November 2017 was 3.7 11/13/16; this is a patient I have not seen since February who is readmitted to our clinic last week. 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 Bridgett Larsson. I have reviewed Dr. Lianne Moris notes from March/23/18. Doppler ABI on that date showed an ABI on the right of 0.48 and on the left of 0.63. Dorsalis pedis waveforms were monophasic bilaterally. There was no waveforms detected at the posterior tibial on the right, monophasic on the left. Dr. Lianne Moris comments were that this patient would have follow-up vascular studies in 3 months including ABIs and right lower extremity arterial duplex. She had an MRI in February that was negative for osteomyelitis but showed generalized edema in the foot. Last albumin I see was in January at 3.4 we have been using Santyl to the 4 wounds on the right leg the patient is noted today to have widespread  edema well up towards her groin this is pitting 2-3+. 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 Normal ejection fraction. 11/20/16; patient has a follow-up appointment with Dr. Bridgett Larsson on April 23. She is still complaining of a lot of pain in the right foot and right leg. It is not clear to me that this is at all positional however I think it is clear claudication with minimal activity perhaps at rest. At our suggestion she is return to her primary physician's office tomorrow with regards to her pitting lower extremity bilateral edema that I reviewed in detail last week 11/27/16; the follow-up with Dr. Bridgett Larsson was actually on May 23 on April 23 as I stated in my note last week. N/A case all of her wounds seems somewhat smaller. The 2 on the right leg are definitely smaller. The areas on the dorsal right first toe, right third toe and the lateral part of the right fifth metatarsal head all looks smaller but have tightly adherent surfaces. We have been using Santyl 12/04/16; follow-up with Dr. Bridgett Larsson on May 23. 2 small open wounds on the right leg continue to get smaller. The area on the right third total lateral aspect of the right fifth toe also look better. The remaining area on the dorsal first toe still has some depth to it. We have been using Santyl to the toes and collagen on the right 12/11/16; according to patient's husband the follow-up with Dr. Bridgett Larsson is not until July. All of the wounds on the right leg are measuring smaller. We have been using some combination of Prisma and Santyl although I think we can go to straight Prisma today. There may have been some confusion with home health about the primary dressing orders here. 12/25/16; the patient has had some healing this week. The area on her right lateral fifth metatarsal head, right third toe are both healed lower right leg is healed. In the vein harvest site superiorly she has one superficial open area. On the dorsal aspect of her right great toe/MTP joint the wound is now divided into 2 however the proximal area is  deep and there is palpable bone 01/01/17; still an open area in the middle of her original right surgical scar. The area on the right third toe and right lateral fifth metatarsal head remained closed. Problematic area on the dorsal aspect of the toe. Previous surgery in this area Electronic Signature(s) Signed: 01/01/2017 4:44:14 PM By: Linton Ham MD Entered By: Linton Ham on 01/01/2017 12:38:18 [name], [name] ([id number]) [name], [name] ([id number]) -------------------------------------------------------------------------------- Physical Exam Details Patient Name: Gladney, Keoshia V. Date of Service: 01/01/2017 11:15 AM Medical Record Patient Account Number: [id number] Number: Treating RN: Baruch Gouty, RN, BSN, Velva Harman 1941-07-16 [phone] y.o. Other Clinician: Date of Birth/Sex: Female) Treating ROBSON, [name] Primary Care Provider: Beverlyn Roux Provider/Extender: G Referring Provider: Dorthula Nettles in Treatment: 8 Constitutional Sitting or standing Blood Pressure is within target range for patient.. Pulse regular and within target range for patient.[name] Respirations regular, non-labored and within target range.. Temperature is normal and within the target range for the patient.[name] appears in no distress. Notes Wound exam; the distal right leg wound is closed. Still a small open area in the proximal scar however this appears better oShe has healed in the right lateral fifth metatarsal head and the right third toe dorsally. oThe remaining area on the dorsal first toe is still problematic. Probing to bone but without clear  infection. The area was debrided with a #3 curet to remove necrotic material nonviable circumference subcutaneous tissue. She will need an x-ray perhaps an MRI Electronic Signature(s) Signed: 01/01/2017 4:44:14 PM By: Linton Ham MD Entered By: Linton Ham on 01/01/2017 12:39:45 [name], [name]  ([id number]) -------------------------------------------------------------------------------- Physician Orders Details Patient Name: Rahl, Kenora V. Date of Service: 01/01/2017 11:15 AM Medical Record Patient Account Number: [id number] Number: Treating RN: Baruch Gouty, RN, BSN, Velva Harman 1940/11/28 [phone] y.o. Other Clinician: Date of Birth/Sex: Female) Treating ROBSON, [name] Primary Care Provider: Beverlyn Roux Provider/Extender: G Referring Provider: Dorthula Nettles in Treatment: 8 Verbal / Phone Orders: No Diagnosis Coding Wound Cleansing Wound #6 Right,Proximal Lower Leg o Clean wound with Normal Saline. o Cleanse wound with mild soap and water Wound #7 Right,Dorsal Metatarsal head first o Clean wound with Normal Saline. o Cleanse wound with mild soap and water Anesthetic Wound #6 Right,Proximal Lower Leg o Topical Lidocaine 4% cream applied to wound bed prior to debridement Wound #7 Right,Dorsal Metatarsal head first o Topical Lidocaine 4% cream applied to wound bed prior to debridement Primary Wound Dressing Wound #6 Right,Proximal Lower Leg o Prisma Ag Wound #7 Right,Dorsal Metatarsal head first o Prisma Ag Secondary Dressing Wound #6 Right,Proximal Lower Leg o Dry Gauze o Boardered Foam Dressing Wound #7 Right,Dorsal Metatarsal head first o Dry Gauze o Conform/Kerlix Dressing Change Frequency Wound #6 Right,Proximal Lower Leg [name], [name] ([id number]) o Three times weekly Wound #7 Right,Dorsal Metatarsal head first o Change dressing every day. Follow-up Appointments Wound #6 Right,Proximal Lower Leg o Return Appointment in 1 week. Wound #7 Right,Dorsal Metatarsal head first o Return Appointment in 1 week. Edema Control Wound #6 Right,Proximal Lower Leg o Elevate legs to the level of the heart and pump ankles as often as possible Wound #7 Right,Dorsal Metatarsal head first o Elevate legs to the level of the heart and  pump ankles as often as possible Home Health Wound #6 Right,Proximal Lower Leg o [place] Visits - [facility] twice weekly, [facility]-Wednesday, teach family member to change on off day. o Home Health Nurse may visit PRN to address patientos wound care needs. o FACE TO FACE ENCOUNTER: MEDICARE and MEDICAID PATIENTS: I certify that this patient is under my care and that I had a face-to-face encounter that meets the physician face-to-face encounter requirements with this patient on this date. The encounter with the patient was in whole or in part for the following MEDICAL CONDITION: (primary reason for [place]) MEDICAL NECESSITY: I certify, that based on my findings, NURSING services are a medically necessary home health service. HOME BOUND STATUS: I certify that my clinical findings support that this patient is homebound (i.e., Due to illness or injury, pt requires aid of supportive devices such as crutches, cane, wheelchairs, walkers, the use of special transportation or the assistance of another person to leave their place of residence. There is a normal inability to leave the home and doing so requires considerable and taxing effort. Other absences are for medical reasons / religious services and are infrequent or of short duration when for other reasons). o If current dressing causes regression in wound condition, may D/C ordered dressing product/s and apply Normal Saline Moist Dressing daily until next [place] / Other MD appointment. [place] of regression in wound condition at [phone]. o Please direct any NON-WOUND related issues/requests for orders to patient's Primary Care Physician Wound #7 Right,Dorsal Metatarsal head first o [place] Visits -  [facility] twice weekly, [facility]-Wednesday, teach family member to change on off day. o Home Health Nurse may visit PRN to address patientos  wound care needs. o FACE TO FACE ENCOUNTER: MEDICARE and MEDICAID PATIENTS: I certify that this patient is under my care and that I had a face-to-face encounter that meets the physician face-to-face [place], JUSTINA BERTINI. ([id number]) encounter requirements with this patient on this date. The encounter with the patient was in whole or in part for the following MEDICAL CONDITION: (primary reason for [place]) MEDICAL NECESSITY: I certify, that based on my findings, NURSING services are a medically necessary home health service. HOME BOUND STATUS: I certify that my clinical findings support that this patient is homebound (i.e., Due to illness or injury, pt requires aid of supportive devices such as crutches, cane, wheelchairs, walkers, the use of special transportation or the assistance of another person to leave their place of residence. There is a normal inability to leave the home and doing so requires considerable and taxing effort. Other absences are for medical reasons / religious services and are infrequent or of short duration when for other reasons). o If current dressing causes regression in wound condition, may D/C ordered dressing product/s and apply Normal Saline Moist Dressing daily until next [place] / Other MD appointment. [place] of regression in wound condition at [phone]. o Please direct any NON-WOUND related issues/requests for orders to patient's Primary Care Physician Radiology o X-ray, foot - R Electronic Signature(s) Signed: 01/01/2017 4:44:14 PM By: Linton Ham MD Signed: 01/01/2017 5:20:40 PM By: Regan Lemming BSN, RN Entered By: Regan Lemming on 01/01/2017 11:53:08 [name], [name] ([id number]) -------------------------------------------------------------------------------- Problem List Details Patient Name: [name], [name] Date of Service: 01/01/2017 11:15 AM Medical Record Patient Account Number:  [id number] Number: Treating RN: Baruch Gouty, RN, BSN, Velva Harman [birth year] [phone] y.o. 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 Date of Service: 01/01/2017 11:15 AM Medical Record Patient Account Number: [id number] Number: Treating RN: Baruch Gouty, RN, BSN, Velva Harman Dec 31, 1940 [phone] y.o. Other Clinician: Date of Birth/Sex: Female) Treating ROBSON, [name] Primary Care Provider: Beverlyn Roux Provider/Extender: G Referring Provider: Dorthula Nettles in Treatment: 8 Subjective Chief Complaint Information obtained from Patient The patient is here for initial evaluation of RLE and Right foot/toe ulcers History of Present Illness (HPI) 08/13/16: This is a 76 year old woman who came predominantly for review of 3 cm in  diameter circular wound to the left anterior lateral leg. She was in the ER on 08/01/16 I reviewed their notes. There was apparently pus coming out of the wound  at that time and the patient arrived requesting debridement which they don't do in the emergency room. Nevertheless I can'[name] see that they did any x-rays. There were no cultures done. She is a type II diabetic and I a note after the patient was in the clinic that she had a bypass graft from the popliteal to the tibial on the right on 02/28/16. She also had a right greater saphenous vein harvest on the same date for arterial bypass. She is going to have vascular studies including ABIs [name] ABIs on the right on 08/28/16. The patient's surgery was on 02/28/16 by Dr. Vallarie Mare she had a right below the knee popliteal artery to peroneal artery bypass with reverse greater saphenous vein and an endarterectomy of the mid segment peroneal artery. Postoperatively she had a strong mild monophasic peroneal signal with a pink foot. It would appear that the patient is had some nonhealing in the surgical saphenous vein harvest site on the left leg. Surprisingly looking [facility] I cannot see much information about this at all. Dr. Lucious Groves notes from 05/29/16 show that the patient's wounds "are not healed" the right first metatarsal wound healed but then opened back up. 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 At that point the patient had a wound VAC to her right calf with regards to the wounds on her dorsal right toe would appear that these are felt to be arterial wounds. She has had surgery on the metatarsal phalangeal in 2015 I Dr. Doran Durand secondary to a right metatarsal phalangeal joint fracture. She is apparently had discoloration around this area since then. 08/28/16; patient arrives with her wounds in much the same condition. The linear vein  harvest site and the circular wound below it which I think was a blister. She also has to probing holes in her right great toe and a necrotic eschar on the right second toe. Because of these being arterial wounds I reduced her compression from 3-2 layers this seems to of done satisfactorily she has not had any problems. I cannot see that she is actually had an x-ray ====== 11/06/16 the patient comes in for evaluation of her right lower extremity ulcers. She was here in January 2018 for 2 visits subsequently ended up in the hospital with pneumonia and then to rehabilitation. She has now been [place], [place] ([id number]) discharged from rehabilitation and is home. She has multiple ulcerations to her right lower extremity including the foot and toes. She does have home health in place and they have been placing alginate to the ulcers. She is followed by Dr. Bridgett Larsson of vascular medicine. 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 She recently saw him on 3/23. In office ABIs were: Right 0.48 with monophasic flow to the DP, PT, peroneal Left 0.63 with monophasic flow to the DP and PT Her arterial studies indicated a patent right below knee popliteal to peroneal bypass She had an MRI in February 2008 that was negative frosty myelitis but this showed general soft tissue edema in the right foot and lower extremity concerning for cellulitis She is a diabetic, managed with insulin. Her hemoglobin A1c in December 2017 was 8.4 which is a trend up from previous levels. She had blood work in February 2018 which revealed an albumin of 2.6 this appears to be relatively acute as an  albumin in November 2017 was 3.7 11/13/16; this is a patient I have not seen since February who is readmitted to our clinic last week. 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 Bridgett Larsson. I have reviewed  Dr. Lianne Moris notes from March/23/18. Doppler ABI on that date showed an ABI on the right of 0.48 and on the left of 0.63. Dorsalis pedis waveforms were monophasic bilaterally. There was no waveforms detected at the posterior tibial on the right, monophasic on the left. Dr. Lianne Moris comments were that this patient would have follow-up vascular studies in 3 months including ABIs and right lower extremity arterial duplex. She had an MRI in February that was negative for osteomyelitis but showed generalized edema in the foot. Last albumin I see was in January at 3.4 we have been using Santyl to the 4 wounds on the right leg the patient is noted today to have widespread edema well up towards her groin this is pitting 2-3+. 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 Normal ejection fraction. 11/20/16; patient has a follow-up appointment with Dr. Bridgett Larsson on April 23. She is still complaining of a lot of pain in the right foot and right leg. It is not clear to me that this is at all positional however I think it is clear claudication with minimal activity perhaps at rest. At our suggestion she is return to her primary physician's office tomorrow with regards to her pitting lower extremity bilateral edema that I reviewed in detail last week 11/27/16; the follow-up with Dr. Bridgett Larsson was actually on May 23 on April 23 as I stated in my note last week. N/A case all of her wounds seems somewhat smaller. The 2 on the right leg are definitely smaller. The areas on the dorsal right first toe, right third toe and the lateral part of the right fifth metatarsal head all looks smaller but have tightly adherent surfaces. We have been using Santyl 12/04/16; follow-up with Dr. Bridgett Larsson on May 23. 2 small open wounds on the right leg continue to get smaller. The area on the right third total lateral aspect of the right fifth toe also look better. The remaining area on the dorsal  first toe still has some depth to it. We have been using Santyl to the toes and collagen on the right 12/11/16; according to patient's husband the follow-up with Dr. Bridgett Larsson is not until July. All of the wounds on the right leg are measuring smaller. We have been using some combination of Prisma and Santyl although I think we can go to straight Prisma today. There may have been some confusion with home health about the primary dressing orders here. 12/25/16; the patient has had some healing this week. The area on her right lateral fifth metatarsal head, right third toe are both healed lower right leg is healed. In the vein harvest site superiorly she has one superficial open area. On the dorsal aspect of her right great toe/MTP joint the wound is now divided into 2 however the proximal area is deep and there is palpable bone 01/01/17; still an open area in the middle of her original right surgical scar. The area on the right third toe and right lateral fifth metatarsal head remained closed. Problematic area on the dorsal aspect of the toe. Previous surgery in this area [name], [name] ([id number]) Objective Constitutional Sitting or standing Blood Pressure is  within target range for patient.. Pulse regular and within target range for patient.[name] Respirations regular, non-labored and within target range.. Temperature is normal and within the target range for the patient.[name] appears in no distress. Vitals Time Taken: 11:30 AM, Height: 64 in, Weight: 200 lbs, BMI: 34.3, Temperature: 97.5 F, Pulse: 74 bpm, Respiratory Rate: 17 breaths/min, Blood Pressure: 132/37 mmHg. General Notes: Wound exam; the distal right leg wound is closed. Still a small open area in the proximal scar however this appears better She has healed in the right lateral fifth metatarsal head and the right third toe dorsally. The remaining area on the dorsal first toe is still problematic. Probing to bone but without clear infection. The  area was debrided with a #3 curet to remove necrotic material nonviable circumference subcutaneous tissue. She will need an x-ray perhaps an MRI Integumentary (Hair, Skin) Wound #6 status is Open. Original cause of wound was Gradually Appeared. The wound is located on the Right,Proximal Lower Leg. The wound measures 0.6cm length x 0.3cm width x 0.1cm depth; 0.141cm^2 area and 0.014cm^3 volume. There is Fat Layer (Subcutaneous Tissue) Exposed exposed. There is no tunneling or undermining noted. There is a medium amount of serosanguineous drainage noted. The wound margin is flat and intact. There is large (67-100%) red, friable granulation within the wound bed. There is no necrotic tissue within the wound bed. The periwound skin appearance exhibited: Hemosiderin Staining, Mottled. The periwound skin appearance did not exhibit: Callus, Crepitus, Excoriation, Induration, Rash, Scarring, Dry/Scaly, Maceration, Atrophie Blanche, Cyanosis, Ecchymosis, Pallor, Rubor, Erythema. Periwound temperature was noted as No Abnormality. Wound #7 status is Open. Original cause of wound was Gradually Appeared. The wound is located on the Right,Dorsal Metatarsal head first. The wound measures 2.5cm length x 1cm width x 0.5cm depth; 1.963cm^2 area and 0.982cm^3 volume. There is Fat Layer (Subcutaneous Tissue) Exposed exposed. There is no tunneling or undermining noted. There is a large amount of serous drainage noted. The wound margin is flat and intact. There is medium (34-66%) pink, friable granulation within the wound bed. There is a medium (34-66%) amount of necrotic tissue within the wound bed including Adherent Slough. The periwound skin appearance exhibited: Induration, Scarring, Maceration, Ecchymosis, Hemosiderin Staining, Mottled. Periwound temperature was noted as No Abnormality. The periwound has tenderness on palpation. 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 There was a Skin/Subcutaneous Tissue Debridement ([id number]) debridement with total area of 2.5 sq cm performed by Ricard Dillon, MD. with the following instrument(s): Curette to remove Non-Viable tissue/material including Fat Layer (and Subcutaneous Tissue) Exposed, Fibrin/Slough, and Subcutaneous after achieving pain control using Lidocaine 4% Topical Solution. A time out was conducted at 11:47, prior to the start of the procedure. A Large amount of bleeding was controlled with Pressure. The procedure was tolerated well with a pain  level of 0 throughout and a pain level of 0 following the procedure. Post Debridement Measurements: 2.5cm length x 1cm width x 0.5cm depth; 0.982cm^3 volume. Character of Wound/Ulcer Post Debridement requires further debridement. Severity of Tissue Post Debridement is: Fat layer exposed. Post procedure Diagnosis Wound #7: Same as Pre-Procedure Plan Wound Cleansing: Wound #6 Right,Proximal Lower Leg: Clean wound with Normal Saline. Cleanse wound with mild soap and water Wound #7 Right,Dorsal Metatarsal head first: Clean wound with Normal Saline. Cleanse wound with mild soap and water Anesthetic: Wound #6 Right,Proximal Lower  Leg: Topical Lidocaine 4% cream applied to wound bed prior to debridement Wound #7 Right,Dorsal Metatarsal head first: [name], Takhia V. ([id number]) Topical Lidocaine 4% cream applied to wound bed prior to debridement Primary Wound Dressing: Wound #6 Right,Proximal Lower Leg: Prisma Ag Wound #7 Right,Dorsal Metatarsal head first: Prisma Ag Secondary Dressing: Wound #6 Right,Proximal Lower Leg: Dry Gauze Boardered Foam Dressing Wound #7 Right,Dorsal Metatarsal head first: Dry Gauze Conform/Kerlix Dressing Change Frequency: Wound #6 Right,Proximal Lower Leg: Three times weekly Wound #7 Right,Dorsal Metatarsal head first: Change dressing every day. Follow-up Appointments: Wound #6 Right,Proximal Lower Leg: Return Appointment in 1 week. Wound #7 Right,Dorsal Metatarsal head first: Return Appointment in 1 week. Edema Control: Wound #6 Right,Proximal Lower Leg: Elevate legs to the level of the heart and pump ankles as often as possible Wound #7 Right,Dorsal Metatarsal head first: Elevate legs to the level of the heart and pump ankles as often as possible Home Health: Wound #6 Right,Proximal Lower Leg: Continue Home Health Visits - [facility] weekly[facility], teach family member to change on off day. Home Health Nurse may visit [facility] to address patient s wound care needs. FACE TO FACE ENCOUNTER: MEDICARE and MEDICAID PATIENTS: I certify that this patient is under my care and that I had a face-to-face encounter that meets the physician face-to-face encounter requirements with this patient on this date. The encounter with the patient was in whole or in part for the following MEDICAL CONDITION: (primary reason for [place]) MEDICAL NECESSITY: I certify, that based on my findings, NURSING services are a medically necessary home health service. HOME BOUND STATUS: I certify that my clinical findings support that this patient is homebound (i.e., Due to illness  or injury, pt requires aid of supportive devices such as crutches, cane, wheelchairs, walkers, the use of special transportation or the assistance of another person to leave their place of residence. There is a normal inability to leave the home and doing so requires considerable and taxing effort. Other absences are for medical reasons / religious services and are infrequent or of short duration when for other reasons). If current dressing causes regression in wound condition, may D/C ordered dressing product/s and apply Normal Saline Moist Dressing daily until next [place] / Other MD appointment. [place] of regression in wound condition at [phone]. Please direct any NON-WOUND related issues/requests for orders to patient's Primary Care Physician Wound #7 Right,Dorsal Metatarsal head first: [place] Visits - [facility] twice weekly, [facility], teach family member to change on off day. Home Health Nurse may visit [facility] to address patient s wound care needs. DAMICA, GRAVLIN ([id number]) FACE TO FACE ENCOUNTER: MEDICARE and MEDICAID PATIENTS: I certify that this patient is under my care and that I had a face-to-face encounter that meets the physician face-to-face encounter requirements with this patient on this date. The encounter with the patient was in whole or in part  for the following MEDICAL CONDITION: (primary reason for Home Healthcare) MEDICAL NECESSITY: I certify, that based on my findings, NURSING services are a medically necessary home health service. HOME BOUND STATUS: I certify that my clinical findings support that this patient is homebound (i.e., Due to illness or injury, pt requires aid of supportive devices such as crutches, cane, wheelchairs, walkers, the use of special transportation or the assistance of another person to leave their place of residence. There is a normal inability to leave the home and doing so  requires considerable and taxing effort. Other absences are for medical reasons / religious services and are infrequent or of short duration when for other reasons). If current dressing causes regression in wound condition, may D/C ordered dressing product/s and apply Normal Saline Moist Dressing daily until next [place] / Other MD appointment. [place] of regression in wound condition at [phone]. Please direct any NON-WOUND related issues/requests for orders to patient's Primary Care Physician Radiology ordered were: X-ray, foot - R #1 I'm going to continue with Prisma to all wounds are now. Home health is changing Monday and Friday. #2 she will need an x-ray and possibly a bone culture Electronic Signature(s) Signed: 01/01/2017 4:44:14 PM By: Linton Ham MD Entered By: Linton Ham on 01/01/2017 12:40:37 Andringa, Nachelle V. ([id number]) -------------------------------------------------------------------------------- SuperBill Details Patient Name: Guglielmo, Nin[place]ka V. Date of Service: 01/01/2017 Medical Record Patient Account Number: [id number] Number: Treating RN: Baruch Gouty, RN, BSN, Velva Harman 1940-11-23 [phone] y.o. 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 I70.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 of  foot Facility Procedures CPT4: Description Modifier Quantity Code [id number] 11042 - DEB SUBQ TISSUE 20 SQ CM/< 1 ICD-10 Description Diagnosis E11.622 Type 2 diabetes mellitus with other skin ulcer L97.519 Non-pressure chronic ulcer of other part of right foot with unspecified  severity Physician Procedures CPT4: Description Modifier Quantity Code [id number] 21194 - WC PHYS SUBQ TISS 20 SQ CM 1 ICD-10 Description Diagnosis E11.622 Type 2 diabetes mellitus with other skin ulcer L97.519 Non-pressure chronic ulcer of other part of right foot with unspecified  severity Electronic Signature(s) Signed: 01/01/2017 4:44:14 PM By: Linton Ham MD Banner, [name] ([id number]) Entered By: Linton Ham on 01/01/2017 12:40:53

## 2017-01-08 ENCOUNTER — Other Ambulatory Visit
Admission: RE | Admit: 2017-01-08 | Discharge: 2017-01-08 | Disposition: A | Discharge status: Home / Self Care | Payer: Medicare HMO | Source: Ambulatory Visit | Attending: Internal Medicine | Admitting: Internal Medicine

## 2017-01-08 ENCOUNTER — Encounter: Payer: Medicare HMO | Attending: Internal Medicine | Admitting: Internal Medicine

## 2017-01-08 DIAGNOSIS — E43 Unspecified severe protein-calorie malnutrition: Secondary | ICD-10-CM | POA: Insufficient documentation

## 2017-01-08 DIAGNOSIS — D649 Anemia, unspecified: Secondary | ICD-10-CM | POA: Diagnosis not present

## 2017-01-08 DIAGNOSIS — Z91012 Allergy to eggs: Secondary | ICD-10-CM | POA: Diagnosis not present

## 2017-01-08 DIAGNOSIS — E11621 Type 2 diabetes mellitus with foot ulcer: Secondary | ICD-10-CM | POA: Diagnosis not present

## 2017-01-08 DIAGNOSIS — I70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 of foot: Secondary | ICD-10-CM | POA: Insufficient documentation

## 2017-01-08 DIAGNOSIS — Z882 Allergy status to sulfonamides status: Secondary | ICD-10-CM | POA: Diagnosis not present

## 2017-01-08 DIAGNOSIS — I110 Hypertensive heart disease with heart failure: Secondary | ICD-10-CM | POA: Diagnosis not present

## 2017-01-08 DIAGNOSIS — L97519 Non-pressure chronic ulcer of other part of right foot with unspecified severity: Secondary | ICD-10-CM | POA: Insufficient documentation

## 2017-01-08 DIAGNOSIS — I509 Heart failure, unspecified: Secondary | ICD-10-CM | POA: Diagnosis not present

## 2017-01-08 DIAGNOSIS — Z880 Allergy status to penicillin: Secondary | ICD-10-CM | POA: Insufficient documentation

## 2017-01-08 DIAGNOSIS — S91302A Unspecified open wound, left foot, initial encounter: Secondary | ICD-10-CM | POA: Insufficient documentation

## 2017-01-08 DIAGNOSIS — E785 Hyperlipidemia, unspecified: Secondary | ICD-10-CM | POA: Diagnosis not present

## 2017-01-08 DIAGNOSIS — E1152 Type 2 diabetes mellitus with diabetic peripheral angiopathy with gangrene: Secondary | ICD-10-CM | POA: Diagnosis not present

## 2017-01-08 DIAGNOSIS — L97219 Non-pressure chronic ulcer of right calf with unspecified severity: Secondary | ICD-10-CM | POA: Insufficient documentation

## 2017-01-08 DIAGNOSIS — Z888 Allergy status to other drugs, medicaments and biological substances status: Secondary | ICD-10-CM | POA: Insufficient documentation

## 2017-01-08 DIAGNOSIS — E11622 Type 2 diabetes mellitus with other skin ulcer: Secondary | ICD-10-CM | POA: Insufficient documentation

## 2017-01-08 DIAGNOSIS — I70232 Atherosclerosis 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 of calf: Secondary | ICD-10-CM | POA: Insufficient documentation

## 2017-01-08 DIAGNOSIS — I350 Nonrheumatic aortic (valve) stenosis: Secondary | ICD-10-CM | POA: Insufficient documentation

## 2017-01-08 DIAGNOSIS — Z794 Long term (current) use of insulin: Secondary | ICD-10-CM | POA: Diagnosis not present

## 2017-01-08 DIAGNOSIS — Z8673 Personal history of transient ischemic attack (TIA), and cerebral infarction without residual deficits: Secondary | ICD-10-CM | POA: Insufficient documentation

## 2017-01-08 DIAGNOSIS — Z91013 Allergy to seafood: Secondary | ICD-10-CM | POA: Diagnosis not present

## 2017-01-09 NOTE — Progress Notes (Signed)
[name] ([id number]) Visit Report for 01/08/2017 Arrival Information Details Patient Name: [name], [name] Date of Service: 01/08/2017 11:15 AM Medical Record Number: [id number] Patient Account Number: [id number] Date of Birth/Sex: 07-15-1941 (76 y.o. Female) Treating RN: Afful, RN, BSN, Velva Harman Primary Care Nickola Lenig: Beverlyn Roux Other Clinician: Referring Standley Bargo: Beverlyn Roux Treating Fernandez Kenley/Extender: Tito Dine in Treatment: 9 Visit Information History Since Last Visit All ordered tests and consults were completed: No Patient Arrived: Wheel Chair Added or deleted any medications: No Arrival Time: 11:11 Any new allergies or adverse reactions: No Accompanied By: hubby Had a fall or experienced change in No activities of daily living that may affect Transfer Assistance: None risk of falls: Patient Identification Verified: Yes Signs or symptoms of abuse/neglect since last No Secondary Verification Process Yes visito Completed: Hospitalized since last visit: No Patient Requires Transmission-Based No Has Dressing in Place as Prescribed: Yes Precautions: Pain Present Now: No Patient Has Alerts: No Electronic Signature(s) Signed: 01/08/2017 4:15:19 PM By: Regan Lemming BSN, RN Entered By: Regan Lemming on 01/08/2017 11:12:22 Sossamon, Jennalynn [name] ([id number]) -------------------------------------------------------------------------------- Encounter Discharge Information Details Patient Name: Veldman, [name] Date of Service: 01/08/2017 11:15 AM Medical Record Number: [id number] Patient Account Number: [id number] Date of Birth/Sex: May 31, 1941 (76 y.o. Female) Treating RN: Afful, RN, BSN, Velva Harman Primary Care Deyna Carbon: Beverlyn Roux Other Clinician: Referring Remmington Teters: Beverlyn Roux Treating Chanteria Haggard/Extender: Tito Dine in Treatment: 9 Encounter Discharge Information Items Discharge Pain Level: 0 Discharge Condition: Stable Ambulatory Status: Wheelchair Discharge  Destination: Home Transportation: Private Auto Accompanied ByOlam Idler Schedule Follow-up Appointment: No Medication Reconciliation completed No and provided to Patient/Care Micheale Schlack: Provided on Clinical Summary of Care: 01/08/2017 Form Type Recipient Paper Patient [facility] Electronic Signature(s) Signed: 01/08/2017 4:15:19 PM By: Regan Lemming BSN, RN Previous Signature: 01/08/2017 11:49:12 AM Version By: Sharon Mt Entered By: Regan Lemming on 01/08/2017 11:50:06 Clinkscale, Tykeisha V. ([id number]) -------------------------------------------------------------------------------- Lower Extremity Assessment Details Patient Name: Julius, [name] Date of Service: 01/08/2017 11:15 AM Medical Record Number: [id number] Patient Account Number: [id number] Date of Birth/Sex: Sep 06, 1940 (76 y.o. Female) Treating RN: Afful, RN, BSN, Velva Harman Primary Care Lennell Shanks: Beverlyn Roux Other Clinician: Referring Mylene Bow: Beverlyn Roux Treating Deny Chevez/Extender: Tito Dine in Treatment: 9 Edema Assessment Assessed: [Left: No] [Right: No] E[Left: dema] [Right: :] Calf Left: Right: Point of Measurement: 34 cm From Medial Instep cm cm Ankle Left: Right: Point of Measurement: 11 cm From Medial Instep cm cm Vascular Assessment Claudication: Claudication Assessment [Right:None] Pulses: Dorsalis Pedis Palpable: [Right:Yes] Posterior Tibial Extremity colors, hair growth, and conditions: Extremity Color: [Right:Mottled] Hair Growth on Extremity: [Right:Yes] Temperature of Extremity: [Right:Warm] Capillary Refill: [Right:< 3 seconds] Toe Nail Assessment Left: Right: Thick: Yes Discolored: Yes Deformed: No Improper Length and Hygiene: Yes Electronic Signature(s) Signed: 01/08/2017 4:15:19 PM By: Regan Lemming BSN, RN Entered By: Regan Lemming on 01/08/2017 11:13:31 Blubaugh, Camy V. ([id number]) Collymore, Loanne V. ([id number]) -------------------------------------------------------------------------------- Multi  Wound Chart Details Patient Name: Crumbley, [name] Date of Service: 01/08/2017 11:15 AM Medical Record Number: [id number] Patient Account Number: [id number] Date of Birth/Sex: 1940-12-18 (76 y.o. 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] [7: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] Comorbid History: [6:Cataracts, Chronic sinus problems/congestion, Congestive Heart Failure, Hypertension, Type II Diabetes] [7:Cataracts, Chronic sinus N/A problems/congestion, Congestive Heart Failure, Hypertension, Type II Diabetes] Date Acquired: [6:09/09/2016] [7:08/06/2013] [N/A:N/A] Weeks of Treatment: [6:9] [7:9] [N/A:N/A] Wound Status: [6:Open] [7:Open] [N/A:N/A] Measurements L x W x D 0.5x0.3x0.1 [7:2.5x0.9x0.5] [N/A:N/A] (cm) Area (cm) : [6:0.118] [7:1.767] [N/A:N/A] Volume (cm) : [6:0.012] [7:0.884] [N/A:N/A] % Reduction in Area: [6:62.40%] [7:-50.00%] [N/A:N/A] % Reduction in Volume: 61.30% [7:-150.40%] [N/A:N/A] Classification: [6:Grade 2] [7:Grade 2] [N/A:N/A] Exudate Amount: [6:Medium] [7:Large] [N/A:N/A] Exudate Type: [6:Serosanguineous] [7:Serous] [N/A:N/A] Exudate Color: [6:red, brown] [7:amber] [N/A:N/A] Wound Margin: [6:Flat and Intact] [7:Flat and Intact] [N/A:N/A] Granulation Amount: [6:Large (67-100%)] [7:Medium (34-66%)] [N/A:N/A] Granulation Quality: [6:Red, Friable] [7:Pink,  Friable] [N/A:N/A] Necrotic Amount: [6:None Present (0%)] [7:Medium (34-66%)] [N/A:N/A] Exposed Structures: [6:Fat Layer (Subcutaneous Tissue) Exposed: Yes] [7:Fat Layer (Subcutaneous Tissue) Exposed: Yes] [N/A:N/A] Fascia: No Fascia: No Tendon: No Tendon: No Muscle: No Muscle: No Joint: No Joint: No Bone: No Bone: No Epithelialization: Medium (34-66%) Small (1-33%) N/A Debridement: N/A Debridement ([id number]) Pre-procedure N/A 11:29 N/A Verification/Time Out Taken: Pain Control: N/A Lidocaine 4% Topical N/A Solution Tissue Debrided: N/A Bone, Fibrin/Slough, Fat, N/A Exudates, Subcutaneous Level: N/A Skin/Subcutaneous N/A Tissue Debridement Area (sq N/A 2.25 N/A cm): Instrument: N/A Curette N/A Specimen: N/A Swab N/A Number of Specimens N/A 1 N/A Taken: Bleeding: N/A Moderate N/A Hemostasis Achieved: N/A Pressure N/A Procedural Pain: N/A 0 N/A Post Procedural Pain: N/A 0 N/A Debridement Treatment N/A Procedure was tolerated N/A Response: well Post Debridement N/A 2.5x0.9x0.5 N/A Measurements L x W x D (cm) Post Debridement N/A 0.884 N/A Volume: (cm) Periwound Skin Texture: Excoriation: No Induration: Yes N/A Induration: No Scarring: Yes Callus: No Crepitus: No Rash: No Scarring: No Periwound Skin Maceration: No Maceration: No N/A Moisture: Dry/Scaly: No Periwound Skin Color: Hemosiderin Staining: Yes Ecchymosis: Yes N/A Mottled: Yes Hemosiderin Staining: Yes Atrophie Blanche: No Mottled: Yes Cyanosis: No Ecchymosis: No Erythema: No Pallor: No Rubor: No Villatoro, Tahirah V. ([id number]) Temperature: No Abnormality No Abnormality N/A Tenderness on No Yes N/A Palpation: Wound Preparation: Ulcer Cleansing: Ulcer Cleansing: N/A Rinsed/Irrigated with Rinsed/Irrigated with Saline Saline Topical Anesthetic Topical Anesthetic Applied: None Applied: Other: lidociane 4% Procedures Performed: N/A Debridement N/A Treatment Notes Wound #6 (Right,  Proximal Lower Leg) 1. Cleansed with: Cleanse wound with antibacterial soap and water 3. Peri-wound Care: Moisturizing lotion 4. Dressing Applied: Other dressing (specify in notes) 5. Secondary Dressing Applied Dry Gauze Kerlix/Conform 7. Secured with Tape Notes ENDOFORM Wound #7 (Right, Dorsal Metatarsal head first) 1. Cleansed with: Cleanse wound with antibacterial soap and water 3. Peri-wound Care: Moisturizing lotion 4. Dressing Applied: Other dressing (specify in notes) 5. Secondary Dressing Applied Dry Gauze Kerlix/Conform 7. Secured with Tape Notes ENDOFORM Electronic Signature(s) Signed: 01/08/2017 4:31:20 PM By: Linton Ham MD Bovee, SREENIDHI GANSON ([id number]) Entered By: Linton Ham on 01/08/2017 12:09:05 Dimitroff, [name] ([id number]) -------------------------------------------------------------------------------- Multi-Disciplinary Care Plan Details Patient Name: Elsasser, Mylia V. Date of Service: 01/08/2017 11:15 AM Medical Record Number: [id number] Patient Account Number: [id number] Date of Birth/Sex: June 26, 1941 (76 y.o. Female) Treating RN: Afful, RN, BSN, Velva Harman Primary Care Mystique Bjelland: Beverlyn Roux Other Clinician: Referring Ernestina Joe: Beverlyn Roux Treating Allysia Ingles/Extender: Tito Dine in Treatment: 9 Active Inactive ` Abuse / Safety / Falls / Self Care Management Nursing Diagnoses: Impaired physical mobility Potential for falls Goals: Patient will remain injury free Date Initiated: 11/06/2016 Target Resolution Date:  12/30/2016 Goal Status: Active Patient/caregiver will verbalize understanding of skin care regimen Date Initiated: 11/06/2016 Target Resolution Date: 12/30/2016 Goal Status: Active Interventions: Assess fall risk on admission and as needed Treatment Activities: Patient referred to home care : 11/06/2016 Notes: ` Nutrition Nursing Diagnoses: Potential for alteratiion in Nutrition/Potential for imbalanced  nutrition Goals: Patient/caregiver verbalizes understanding of need to maintain therapeutic glucose control per primary care physician Date Initiated: 11/06/2016 Target Resolution Date: 12/30/2016 Goal Status: Active Interventions: Provide education on elevated blood sugars and impact on wound healing Culbertson, Migdalia V. ([id number]) Notes: ` Orientation to the Wound Care Program Nursing Diagnoses: Knowledge deficit related to the wound healing center program Goals: Patient/caregiver will verbalize understanding of the [place] Date Initiated: 11/06/2016 Target Resolution Date: 12/30/2016 Goal Status: Active Interventions: Provide education on orientation to the wound center Notes: ` Venous Leg Ulcer Nursing Diagnoses: Actual venous Insuffiency (use after diagnosis is confirmed) Knowledge deficit related to disease process and management Goals: Non-invasive venous studies are completed as ordered Date Initiated: 11/06/2016 Target Resolution Date: 12/30/2016 Goal Status: Active Patient/caregiver will verbalize understanding of disease process and disease management Date Initiated: 11/06/2016 Target Resolution Date: 12/30/2016 Goal Status: Active Interventions: Assess peripheral edema status every visit. Notes: ` Wound/Skin Impairment Nursing Diagnoses: Impaired tissue integrity Knowledge deficit related to smoking impact on wound healing Knowledge deficit related to ulceration/compromised skin integrity Goals: Zody, Niyla V. ([id number]) Ulcer/skin breakdown will heal within 14 weeks Date Initiated: 11/06/2016 Target Resolution Date: 02/05/2017 Goal Status: Active Interventions: Assess ulceration(s) every visit Treatment Activities: Skin care regimen initiated : 11/06/2016 Notes: Electronic Signature(s) Signed: 01/08/2017 4:15:19 PM By: Regan Lemming BSN, RN Entered By: Regan Lemming on 01/08/2017 11:30:34 Ordaz, Epiphany V.  ([id number]) -------------------------------------------------------------------------------- Pain Assessment Details Patient Name: Labo, Corbyn V. Date of Service: 01/08/2017 11:15 AM Medical Record Number: [id number] Patient Account Number: [id number] Date of Birth/Sex: [birth year] (76 y.o. Female) Treating RN: Afful, RN, BSN, Velva Harman Primary Care Elenora Hawbaker: Beverlyn Roux Other Clinician: Referring [place] Gustafson: Beverlyn Roux Treating Tallula Grindle/Extender: Tito Dine in Treatment: 9 Active Problems Location of Pain Severity and Description of Pain Patient Has Paino No Site Locations With Dressing Change: No Pain Management and Medication Current Pain Management: Electronic Signature(s) Signed: 01/08/2017 4:15:19 PM By: Regan Lemming BSN, RN Entered By: Regan Lemming on 01/08/2017 11:12:36 Baka, Eyana [name] ([id number]) -------------------------------------------------------------------------------- Patient/Caregiver Education Details Patient Name: Steffenhagen, Jalyne V. Date of Service: 01/08/2017 11:15 AM Medical Record Patient Account Number: [id number] Number: Treating RN: Baruch Gouty, RN, BSN, Velva Harman 29-Nov-1940 [id number] y.o. Other Clinician: Date of Birth/Gender: Female) Treating ROBSON, [name] Primary Care Physician: Beverlyn Roux Physician/Extender: G Referring Physician: Dorthula Nettles in Treatment: 9 Education Assessment Education Provided To: Patient Education Topics Provided Elevated Blood Sugar/ Impact on Healing: Methods: Explain/Verbal Responses: State content correctly Welcome To The [place]: Methods: Explain/Verbal Responses: State content correctly Electronic Signature(s) Signed: 01/08/2017 4:15:19 PM By: Regan Lemming BSN, RN Entered By: Regan Lemming on 01/08/2017 11:50:20 Bevill, Anmarie V. ([id number]) -------------------------------------------------------------------------------- Wound Assessment Details Patient Name: Feldkamp, Taia V. Date of Service: 01/08/2017  11:15 AM Medical Record Number: [id number] Patient Account Number: [id number] Date of Birth/Sex: Dec 31, 1940 (76 y.o. Female) Treating RN: Afful, RN, BSN, Velva Harman Primary Care Amauri Medellin: Beverlyn Roux Other Clinician: Referring Haset Oaxaca: Beverlyn Roux Treating Kazuki Ingle/Extender: Tito Dine in Treatment: 9 Wound Status Wound Number: 6 Primary Diabetic Wound/Ulcer of the Lower Etiology: Extremity Wound Location: Right Lower Leg - Proximal Wound Open Wounding Event: Gradually Appeared Status: Date Acquired: 09/09/2016 Comorbid  Cataracts, Chronic sinus Weeks Of Treatment: 9 History: problems/congestion, Congestive Heart Clustered Wound: No Failure, Hypertension, Type II Diabetes Photos Photo Uploaded By: Regan Lemming on 01/08/2017 16:26:46 Wound Measurements Length: (cm) 0.5 Width: (cm) 0.3 Depth: (cm) 0.1 Area: (cm) 0.118 Volume: (cm) 0.012 % Reduction in Area: 62.4% % Reduction in Volume: 61.3% Epithelialization: Medium (34-66%) Tunneling: No Undermining: No Wound Description Classification: Grade 2 Foul Odor Aft Wound Margin: Flat and Intact Slough/Fibrin Exudate Amount: Medium Exudate Type: Serosanguineous Audino, Allora V. ([id number]) er Cleansing: No o No Exudate Color: red, brown Wound Bed Granulation Amount: Large (67-100%) Exposed Structure Granulation Quality: Red, Friable Fascia Exposed: No Necrotic Amount: None Present (0%) Fat Layer (Subcutaneous Tissue) Exposed: Yes Tendon Exposed: No Muscle Exposed: No Joint Exposed: No Bone Exposed: No Periwound Skin Texture Texture Color No Abnormalities Noted: No No Abnormalities Noted: No Callus: No Atrophie Blanche: No Crepitus: No Cyanosis: No Excoriation: No Ecchymosis: No Induration: No Erythema: No Rash: No Hemosiderin Staining: Yes Scarring: No Mottled: Yes Pallor: No Moisture Rubor: No No Abnormalities Noted: No Dry / Scaly: No Temperature / Pain Maceration: No Temperature: No  Abnormality Wound Preparation Ulcer Cleansing: Rinsed/Irrigated with Saline Topical Anesthetic Applied: None Treatment Notes Wound #6 (Right, Proximal Lower Leg) 1. Cleansed with: Cleanse wound with antibacterial soap and water 3. Peri-wound Care: Moisturizing lotion 4. Dressing Applied: Other dressing (specify in notes) 5. Secondary Dressing Applied Dry Gauze Kerlix/Conform 7. Secured with Tape Notes ENDOFORM Electronic Signature(s) Signed: 01/08/2017 4:15:19 PM By: Regan Lemming BSN, RN Doster, Kingston V. ([id number]) Entered By: Regan Lemming on 01/08/2017 11:23:38 Tedeschi, Jalaya [name] ([id number]) -------------------------------------------------------------------------------- Wound Assessment Details Patient Name: Cua, Willean V. Date of Service: 01/08/2017 11:15 AM Medical Record Number: [id number] Patient Account Number: [id number] Date of Birth/Sex: Mar 07, 1941 (76 y.o. Female) Treating RN: Afful, RN, BSN, Administrator, sports Primary Care Catalena Stanhope: Beverlyn Roux Other Clinician: Referring Renarda Mullinix: Beverlyn Roux Treating Ayelet Gruenewald/Extender: Tito Dine in Treatment: 9 Wound Status Wound Number: 7 Primary Diabetic Wound/Ulcer of the Lower Etiology: Extremity Wound Location: Right Metatarsal head first - Dorsal Wound Open Status: Wounding Event: Gradually Appeared Comorbid Cataracts, Chronic sinus Date Acquired: 08/06/2013 History: problems/congestion, Congestive Heart Weeks Of Treatment: 9 Failure, Hypertension, Type II Diabetes Clustered Wound: No Photos Photo Uploaded By: Regan Lemming on 01/08/2017 16:26:47 Wound Measurements Length: (cm) 2.5 Width: (cm) 0.9 Depth: (cm) 0.5 Area: (cm) 1.767 Volume: (cm) 0.884 % Reduction in Area: -50% % Reduction in Volume: -150.4% Epithelialization: Small (1-33%) Tunneling: No Undermining: No Wound Description Classification: Grade 2 Wound Margin: Flat and Intact Exudate Amount: Large Exudate Type: Serous Exudate Color: amber Foul  Odor After Cleansing: No Slough/Fibrino Yes Wound Bed Granulation Amount: Medium (34-66%) Exposed Structure Granulation Quality: Pink, Friable Fascia Exposed: No Necrotic Amount: Medium (34-66%) Fat Layer (Subcutaneous Tissue) Exposed: Yes Kusch, Unita V. ([id number]) Necrotic Quality: Adherent Slough Tendon Exposed: No Muscle Exposed: No Joint Exposed: No Bone Exposed: No Periwound Skin Texture Texture Color No Abnormalities Noted: No No Abnormalities Noted: No Induration: Yes Ecchymosis: Yes Scarring: Yes Hemosiderin Staining: Yes Mottled: Yes Moisture No Abnormalities Noted: No Temperature / Pain Maceration: No Temperature: No Abnormality Tenderness on Palpation: Yes Wound Preparation Ulcer Cleansing: Rinsed/Irrigated with Saline Topical Anesthetic Applied: Other: lidociane 4%, Treatment Notes Wound #7 (Right, Dorsal Metatarsal head first) 1. Cleansed with: Cleanse wound with antibacterial soap and water 3. Peri-wound Care: Moisturizing lotion 4. Dressing Applied: Other dressing (specify in notes) 5. Secondary Dressing Applied Dry Gauze Kerlix/Conform 7. Secured with Tape Notes ENDOFORM Electronic Signature(s) Signed: 01/08/2017  4:15:19 PM By: Regan Lemming BSN, RN Entered By: Regan Lemming on 01/08/2017 11:24:01 [place], Retal [name] ([id number]) -------------------------------------------------------------------------------- Vitals Details Patient Name: Atlas, Tatym V. Date of Service: 01/08/2017 11:15 AM Medical Record Number: [id number] Patient Account Number: [id number] Date of Birth/Sex: [birth year] (76 y.o. Female) Treating RN: Afful, RN, BSN, Velva Harman Primary Care Nishaan Stanke: Beverlyn Roux Other Clinician: Referring Harmonii Karle: Beverlyn Roux Treating Katori Wirsing/Extender: Tito Dine in Treatment: 9 Vital Signs Time Taken: 11:13 Temperature (F): 97.8 Height (in): 64 Pulse (bpm): 72 Weight (lbs): 200 Respiratory Rate (breaths/min): 17 Body Mass Index  (BMI): 34.3 Blood Pressure (mmHg): 162/60 Reference Range: 80 - 120 mg / dl Electronic Signature(s) Signed: 01/08/2017 4:15:19 PM By: Regan Lemming BSN, RN Entered By: Regan Lemming on 01/08/2017 11:17:06

## 2017-01-09 NOTE — Progress Notes (Signed)
PEARLY, BARTOSIK ([id number]) Visit Report for 01/08/2017 Chief Complaint Document Details Patient Name: [name], [name] Date of Service: 01/08/2017 11:15 AM Medical Record Patient Account Number: [id number] Number: Treating RN: Baruch Gouty, RN, BSN, Velva Harman 1941/07/22 [phone] y.o. Other Clinician: Date of Birth/Sex: Female) Treating ROBSON, [name] Primary Care Provider: Beverlyn Roux Provider/Extender: G Referring Provider: Dorthula Nettles in Treatment: 9 Information Obtained from: Patient Chief Complaint The patient is here for initial evaluation of RLE and Right foot/toe ulcers Electronic Signature(s) Signed: 01/08/2017 4:31:20 PM By: Linton Ham MD Entered By: Linton Ham on 01/08/2017 12:09:27 Rowley, [name] ([id number]) -------------------------------------------------------------------------------- Debridement Details Patient Name: Mcpeters, [name] Date of Service: 01/08/2017 11:15 AM Medical Record Patient Account Number: [id number] Number: Treating RN: Baruch Gouty, RN, BSN, Velva Harman 01-Dec-1940 [phone] y.o. Other Clinician: Date of Birth/Sex: Female) Treating ROBSON, [place] Primary Care Provider: Beverlyn Roux Provider/Extender: G Referring Provider: Dorthula Nettles in Treatment: 9 Debridement Performed for Wound #7 Right,Dorsal Metatarsal head first Assessment: Performed By: Physician Ricard Dillon, MD Debridement: Debridement Severity of Tissue Pre Fat layer exposed Debridement: Pre-procedure Verification/Time Out Yes - 11:29 Taken: Start Time: 11:29 Pain Control: Lidocaine 4% Topical Solution Level: Skin/Subcutaneous Tissue Total Area Debrided ([name] x 2.5 (cm) x 0.9 (cm) = 2.25 (cm) W): Tissue and other Non-Viable, Bone, Exudate, Fat, Fibrin/Slough, Subcutaneous material debrided: Instrument: Curette Specimen: Swab Number of Specimens 1 Taken: Bleeding: Moderate Hemostasis Achieved: Pressure End Time: 11:33 Procedural Pain: 0 Post Procedural  Pain: 0 Response to Treatment: Procedure was tolerated well Post Debridement Measurements of Total Wound Length: (cm) 2.5 Width: (cm) 0.9 Depth: (cm) 0.5 Volume: (cm) 0.884 Character of Wound/Ulcer Post Requires Further Debridement Debridement: Bone involvement without Severity of Tissue Post Debridement: necrosis Post Procedure Diagnosis [name], [name] ([id number]) Same as Pre-procedure Electronic Signature(s) Signed: 01/08/2017 4:15:19 PM By: Regan Lemming BSN, RN Signed: 01/08/2017 4:31:20 PM By: Linton Ham MD Entered By: Linton Ham on 01/08/2017 12:09:20 [name], [name] ([id number]) -------------------------------------------------------------------------------- HPI Details Patient Name: Laughter, [name] Date of Service: 01/08/2017 11:15 AM Medical Record Patient Account Number: [id number] Number: Treating RN: Baruch Gouty, RN, BSN, Velva Harman February 04, 1941 [phone] y.o. Other Clinician: Date of Birth/Sex: Female) Treating ROBSON, [name] Primary Care Provider: Beverlyn Roux Provider/Extender: G Referring Provider: Dorthula Nettles in Treatment: 9 History of Present Illness HPI Description: 08/13/16: This is a 76 year old woman who came predominantly for review of 3 cm in diameter circular wound to the left anterior lateral leg. She was in the ER on 08/01/16 I reviewed their notes. There was apparently pus coming out of the wound at that time and the patient arrived requesting debridement which they don't do in the emergency room. Nevertheless I can't see that they did any x-rays. There were no cultures done. She is a type II diabetic and I a note after the patient was in the clinic that she had a bypass graft from the popliteal to the tibial on the right on 02/28/16. She also had a right greater saphenous vein harvest on the same date for arterial bypass. She is going to have vascular studies including ABIs T ABIs on the right on 08/28/16. The patient's surgery was on 02/28/16 by Dr.  Vallarie Mare she had a right below the knee popliteal artery to peroneal artery bypass with reverse greater saphenous vein and an endarterectomy of the mid segment peroneal artery. Postoperatively she had a strong mild monophasic peroneal signal with a pink foot. It would appear that the patient is had some nonhealing in  the surgical saphenous vein harvest site on the left leg. [facility] I cannot see much information about this at all. Dr. Lucious Groves notes from 05/29/16 show that the patient's wounds "are not healed" the right first metatarsal wound healed but then opened back up. 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 At that point the patient had a wound VAC to her right calf with regards to the wounds on her dorsal right toe would appear that these are felt to be arterial wounds. She has had surgery on the metatarsal phalangeal in 2015 I Dr. Doran Durand secondary to a right metatarsal phalangeal joint fracture. She is apparently had discoloration around this area since then. 08/28/16; patient arrives with her wounds in much the same condition. The linear vein harvest site and the circular wound below it which I think was a blister. She also has to probing holes in her right great toe and a necrotic eschar on the right second toe. Because of these being arterial wounds I reduced her compression from 3-2 layers this seems to of done satisfactorily she has not had any problems. I cannot see that she is actually had an x-ray ====== 11/06/16 the patient comes in for evaluation of her right lower extremity ulcers. She was here in January 2018 for 2 visits subsequently ended up in the hospital with pneumonia and then to rehabilitation. She has now been discharged from rehabilitation and is home. She has multiple ulcerations to her right lower extremity including the foot and  toes. She does have home health in place and they have been placing alginate to the ulcers. She is followed by Dr. Bridgett Larsson of vascular medicine. 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 She recently saw him on 3/23. In office ABIs were: Clapham, KAELY HOLLAN. ([id number]) Right 0.48 with monophasic flow to the DP, PT, peroneal Left 0.63 with monophasic flow to the DP and PT Her arterial studies indicated a patent right below knee popliteal to peroneal bypass She had an MRI in February 2008 that was negative frosty myelitis but this showed general soft tissue edema in the right foot and lower extremity concerning for cellulitis She is a diabetic, managed with insulin. Her hemoglobin A1c in December 2017 was 8.4 which is a trend up from previous levels. She had blood work in February 2018 which revealed an albumin of 2.6 this appears to be relatively acute as an albumin in November 2017 was 3.7 11/13/16; this is a patient I have not seen since February who is readmitted to our clinic last week. 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 Bridgett Larsson. I have reviewed Dr. Lianne Moris notes from March/23/18. Doppler ABI on that date showed an ABI on the right of 0.48 and on the left of 0.63. Dorsalis pedis waveforms were monophasic bilaterally. There was no waveforms detected at the posterior tibial on the right, monophasic on the left. Dr. Lianne Moris comments were that this patient would have follow-up vascular studies in 3 months including ABIs and right lower extremity arterial duplex. She had an MRI in February that was negative for osteomyelitis but showed generalized edema in the foot. Last albumin I see was in January at 3.4 we have been using Santyl to the 4 wounds on the  right leg the patient is noted today to have widespread edema well up towards her groin this is pitting 2-3+.  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 Normal ejection fraction. 11/20/16; patient has a follow-up appointment with Dr. Bridgett Larsson on April 23. She is still complaining of a lot of pain in the right foot and right leg. It is not clear to me that this is at all positional however I think it is clear claudication with minimal activity perhaps at rest. At our suggestion she is return to her primary physician's office tomorrow with regards to her pitting lower extremity bilateral edema that I reviewed in detail last week 11/27/16; the follow-up with Dr. Bridgett Larsson was actually on May 23 on April 23 as I stated in my note last week. N/A case all of her wounds seems somewhat smaller. The 2 on the right leg are definitely smaller. The areas on the dorsal right first toe, right third toe and the lateral part of the right fifth metatarsal head all looks smaller but have tightly adherent surfaces. We have been using Santyl 12/04/16; follow-up with Dr. Bridgett Larsson on May 23. 2 small open wounds on the right leg continue to get smaller. The area on the right third total lateral aspect of the right fifth toe also look better. The remaining area on the dorsal first toe still has some depth to it. We have been using Santyl to the toes and collagen on the right 12/11/16; according to patient's husband the follow-up with Dr. Bridgett Larsson is not until July. All of the wounds on the right leg are measuring smaller. We have been using some combination of Prisma and Santyl although I think we can go to straight Prisma today. There may have been some confusion with home health about the primary dressing orders here. 12/25/16; the patient has had some healing this week. The area on her right lateral fifth metatarsal head, right third toe are both healed lower right leg is healed. In the vein harvest site superiorly she has one superficial open area. On the dorsal aspect of her right great  toe/MTP joint the wound is now divided into 2 however the proximal area is deep and there is palpable bone 01/01/17; still an open area in the middle of her original right surgical scar. The area on the right third toe and right lateral fifth metatarsal head remained closed. Problematic area on the dorsal aspect of the toe. Previous surgery in this area line 01/08/17 small open area on the original scar on her upper anterior leg although this is closing. X-ray I did of the right first toe did not show underlying bony abnormality. Still this area on the dorsal first toe probes to bone. We have been using [employer]) MADDI, COLLAR ([id number]) Signed: 01/08/2017 4:31:20 PM By: Linton Ham MD Entered By: Linton Ham on 01/08/2017 12:15:35 Hinsch, [name] ([id number]) -------------------------------------------------------------------------------- Physical Exam Details Patient Name: Tantillo, Kamyia V. Date of Service: 01/08/2017 11:15 AM Medical Record Patient Account Number: [id number] Number: Treating RN: Baruch Gouty, RN, BSN, Velva Harman [birth year] [phone] y.o. Other Clinician: Date of Birth/Sex: Female) Treating ROBSON, [name] Primary Care Provider: Beverlyn Roux Provider/Extender: G Referring Provider: Dorthula Nettles in Treatment: 9 Constitutional Patient is hypertensive.. Pulse regular and within target range for patient.Marland White Respirations regular, non-labored and within target range.. Temperature is normal and within the target range for the patient.Marland White appears in no distress. Cardiovascular Pedal pulses absent on the  right.. Edema present in both extremities.Marland White [facility] . Notes Wound exam oAll the patient's wounds are closed except for a small open area in the proximal scar although this appears better. oThe other area is on the dorsal right toe this probes to bone and is deep. No evidence of infection area debrided with a #3 curet specimen of "bone" obtained for  culture Electronic Signature(s) Signed: 01/08/2017 4:31:20 PM By: Linton Ham MD Entered By: Linton Ham on 01/08/2017 12:18:08 [name], [name] ([id number]) -------------------------------------------------------------------------------- Physician Orders Details Patient Name: Wardrip, [place] V. Date of Service: 01/08/2017 11:15 AM Medical Record Patient Account Number: [id number] Number: Treating RN: Baruch Gouty, RN, BSN, Velva Harman 04-03-1941 [phone] y.o. Other Clinician: Date of Birth/Sex: Female) Treating ROBSON, [name] Primary Care Provider: Beverlyn Roux Provider/Extender: G Referring Provider: Dorthula Nettles in Treatment: 9 Verbal / Phone Orders: No Diagnosis Coding Wound Cleansing Wound #6 Right,Proximal Lower Leg o Clean wound with Normal Saline. o Cleanse wound with mild soap and water Wound #7 Right,Dorsal Metatarsal head first o Clean wound with Normal Saline. o Cleanse wound with mild soap and water Anesthetic Wound #6 Right,Proximal Lower Leg o Topical Lidocaine 4% cream applied to wound bed prior to debridement Wound #7 Right,Dorsal Metatarsal head first o Topical Lidocaine 4% cream applied to wound bed prior to debridement Primary Wound Dressing Wound #6 Right,Proximal Lower Leg o Other: - ENDOFORM Wound #7 Right,Dorsal Metatarsal head first o Other: - ENDOFORM Secondary Dressing Wound #6 Right,Proximal Lower Leg o Dry Gauze o Boardered Foam Dressing Wound #7 Right,Dorsal Metatarsal head first o Dry Gauze o Conform/Kerlix Dressing Change Frequency Wound #6 Right,Proximal Lower Leg Depascale, Yury V. ([id number]) o Three times weekly Wound #7 Right,Dorsal Metatarsal head first o Change dressing every day. Follow-up Appointments Wound #6 Right,Proximal Lower Leg o Return Appointment in 1 week. Wound #7 Right,Dorsal Metatarsal head first o Return Appointment in 1 week. Edema Control Wound #6 Right,Proximal Lower Leg o  Elevate legs to the level of the heart and pump ankles as often as possible Wound #7 Right,Dorsal Metatarsal head first o Elevate legs to the level of the heart and pump ankles as often as possible Home Health Wound #6 Right,Proximal Lower Leg o [place] [facility] - [facility] twice weekly, [facility]-Wednesday, teach family member to change on off day. o Home Health Nurse may visit PRN to address patientos wound care needs. o FACE TO FACE ENCOUNTER: MEDICARE and MEDICAID PATIENTS: I certify that this patient is under my care and that I had a face-to-face encounter that meets the physician face-to-face encounter requirements with this patient on this date. The encounter with the patient was in whole or in part for the following MEDICAL CONDITION: (primary reason for [place]) MEDICAL NECESSITY: I certify, that based on my findings, NURSING services are a medically necessary home health service. HOME BOUND STATUS: I certify that my clinical findings support that this patient is homebound (i.e., Due to illness or injury, pt requires aid of supportive devices such as crutches, cane, wheelchairs, walkers, the use of special transportation or the assistance of another person to leave their place of residence. There is a normal inability to leave the home and doing so requires considerable and taxing effort. Other absences are for medical reasons / religious services and are infrequent or of short duration when for other reasons). o If current dressing causes regression in wound condition, may D/C ordered dressing product/s and apply Normal Saline Moist Dressing daily until next [place] /  Other MD appointment. [place] of regression in wound condition at [phone]. o Please direct any NON-WOUND related issues/requests for orders to patient's Primary Care Physician Wound #7 Right,Dorsal Metatarsal head first o [place] [facility] - [facility] twice weekly, [facility]-Wednesday, teach family member to change on off day. o Home Health Nurse may visit PRN to address patientos wound care needs. o FACE TO FACE ENCOUNTER: MEDICARE and MEDICAID PATIENTS: I certify that this patient is under my care and that I had a face-to-face encounter that meets the physician face-to-face [place], FILOMENA POKORNEY. ([id number]) encounter requirements with this patient on this date. The encounter with the patient was in whole or in part for the following MEDICAL CONDITION: (primary reason for [place]) MEDICAL NECESSITY: I certify, that based on my findings, NURSING services are a medically necessary home health service. HOME BOUND STATUS: I certify that my clinical findings support that this patient is homebound (i.e., Due to illness or injury, pt requires aid of supportive devices such as crutches, cane, wheelchairs, walkers, the use of special transportation or the assistance of another person to leave their place of residence. There is a normal inability to leave the home and doing so requires considerable and taxing effort. Other absences are for medical reasons / religious services and are infrequent or of short duration when for other reasons). o If current dressing causes regression in wound condition, may D/C ordered dressing product/s and apply Normal Saline Moist Dressing daily until next [place] / Other MD appointment. [place] of regression in wound condition at [phone]. o Please direct any NON-WOUND related issues/requests for orders to patient's Primary Care Physician Laboratory o Bacteria identified in Wound by Culture (MICRO) - BONE oooo LOINC Code: 4259-5 oooo Convenience Name: Wound culture routine Electronic Signature(s) Signed: 01/08/2017 4:15:19 PM By: Regan Lemming BSN, RN Signed: 01/08/2017 4:31:20 PM By: Linton Ham MD Entered By: Regan Lemming on  01/08/2017 11:36:29 [name], [name] ([id number]) -------------------------------------------------------------------------------- Problem List Details Patient Name: Winer, Bevan V. Date of Service: 01/08/2017 11:15 AM Medical Record Patient Account Number: [id number] Number: Treating RN: Baruch Gouty, RN, BSN, Velva Harman 1941-02-11 [phone] y.o. 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 ([id number]) -------------------------------------------------------------------------------- Progress Note Details Patient Name: [name], [name] Date of Service: 01/08/2017 11:15 AM Medical Record Patient Account Number: [id number] Number: Treating RN: Baruch Gouty, RN, BSN, Velva Harman 10-10-1940 [phone] y.o. Other Clinician: Date of Birth/Sex: Female) Treating ROBSON, [name] Primary Care Provider: Beverlyn Roux Provider/Extender: G Referring Provider: Dorthula Nettles in Treatment:  9 Subjective Chief Complaint Information obtained from Patient The patient is here for initial evaluation  of RLE and Right foot/toe ulcers History of Present Illness (HPI) 08/13/16: This is a 76 year old woman who came predominantly for review of 3 cm in diameter circular wound to the left anterior lateral leg. She was in the ER on 08/01/16 I reviewed their notes. There was apparently pus coming out of the wound at that time and the patient arrived requesting debridement which they don't do in the emergency room. Nevertheless I can't see that they did any x-rays. There were no cultures done. She is a type II diabetic and I a note after the patient was in the clinic that she had a bypass graft from the popliteal to the tibial on the right on 02/28/16. She also had a right greater saphenous vein harvest on the same date for arterial bypass. She is going to have vascular studies including ABIs T ABIs on the right on 08/28/16. The patient's surgery was on 02/28/16 by Dr. Vallarie Mare she had a right below the knee popliteal artery to peroneal artery bypass with reverse greater saphenous vein and an endarterectomy of the mid segment peroneal artery. Postoperatively she had a strong mild monophasic peroneal signal with a pink foot. It would appear that the patient is had some nonhealing in the surgical saphenous vein harvest site on the left leg. Surprisingly [facility] I cannot see much information about this at all. Dr. Lucious Groves notes from 05/29/16 show that the patient's wounds "are not healed" the right first metatarsal wound healed but then opened back up. 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 At that point the patient had a wound VAC to her right calf with regards to the wounds on her dorsal right toe would appear that these are felt to be arterial wounds. She has had surgery on  the metatarsal phalangeal in 2015 I Dr. Doran Durand secondary to a right metatarsal phalangeal joint fracture. She is apparently had discoloration around this area since then. 08/28/16; patient arrives with her wounds in much the same condition. The linear vein harvest site and the circular wound below it which I think was a blister. She also has to probing holes in her right great toe and a necrotic eschar on the right second toe. Because of these being arterial wounds I reduced her compression from 3-2 layers this seems to of done satisfactorily she has not had any problems. I cannot see that she is actually had an x-ray ====== 11/06/16 the patient comes in for evaluation of her right lower extremity ulcers. She was here in January 2018 for 2 visits subsequently ended up in the hospital with pneumonia and then to rehabilitation. She has now been Gritton, Kenefic ([id number]) discharged from rehabilitation and is home. She has multiple ulcerations to her right lower extremity including the foot and toes. She does have home health in place and they have been placing alginate to the ulcers. She is followed by Dr. Bridgett Larsson of vascular medicine. 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 She recently saw him on 3/23. In office ABIs were: Right 0.48 with monophasic flow to the DP, PT, peroneal Left 0.63 with monophasic flow to the DP and PT Her arterial studies indicated a patent right below knee popliteal to peroneal bypass She had an MRI in February 2008 that was negative frosty myelitis but this showed general soft tissue  edema in the right foot and lower extremity concerning for cellulitis She is a diabetic, managed with insulin. Her hemoglobin A1c in December 2017 was 8.4 which is a trend up from previous levels. She had blood work in February 2018 which revealed an albumin of 2.6 this appears to be relatively acute as an albumin in  November 2017 was 3.7 11/13/16; this is a patient I have not seen since February who is readmitted to our clinic last week. 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 Bridgett Larsson. I have reviewed Dr. Lianne Moris notes from March/23/18. Doppler ABI on that date showed an ABI on the right of 0.48 and on the left of 0.63. Dorsalis pedis waveforms were monophasic bilaterally. There was no waveforms detected at the posterior tibial on the right, monophasic on the left. Dr. Lianne Moris comments were that this patient would have follow-up vascular studies in 3 months including ABIs and right lower extremity arterial duplex. She had an MRI in February that was negative for osteomyelitis but showed generalized edema in the foot. Last albumin I see was in January at 3.4 we have been using Santyl to the 4 wounds on the right leg the patient is noted today to have widespread edema well up towards her groin this is pitting 2-3+. 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 Normal ejection fraction. 11/20/16; patient has a follow-up appointment with Dr. Bridgett Larsson on April 23. She is still complaining of a lot of pain in the right foot and right leg. It is not clear to me that this is at all positional however I think it is clear claudication with minimal activity perhaps at rest. At our suggestion she is return to her primary physician's office tomorrow with regards to her pitting lower extremity bilateral edema that I reviewed in detail last week 11/27/16; the follow-up with Dr. Bridgett Larsson was actually on May 23 on April 23 as I stated in my note last week. N/A case all of her wounds seems somewhat smaller. The 2 on the right leg are definitely smaller. The areas on the dorsal right first toe, right third toe and the lateral part of the right fifth metatarsal head all looks smaller but have tightly adherent  surfaces. We have been using Santyl 12/04/16; follow-up with Dr. Bridgett Larsson on May 23. 2 small open wounds on the right leg continue to get smaller. The area on the right third total lateral aspect of the right fifth toe also look better. The remaining area on the dorsal first toe still has some depth to it. We have been using Santyl to the toes and collagen on the right 12/11/16; according to patient's husband the follow-up with Dr. Bridgett Larsson is not until July. All of the wounds on the right leg are measuring smaller. We have been using some combination of Prisma and Santyl although I think we can go to straight Prisma today. There may have been some confusion with home health about the primary dressing orders here. 12/25/16; the patient has had some healing this week. The area on her right lateral fifth metatarsal head, right third toe are both healed lower right leg is healed. In the vein harvest site superiorly she has one superficial open area. On the dorsal aspect of her right great toe/MTP joint the wound is now divided into 2 however the proximal area is deep and there is palpable bone  01/01/17; still an open area in the middle of her original right surgical scar. The area on the right third toe and right lateral fifth metatarsal head remained closed. Problematic area on the dorsal aspect of the toe. Previous surgery in this area line 01/08/17 small open area on the original scar on her upper anterior leg although this is closing. X-ray I did of [name], [name] ([id number]) the right first toe did not show underlying bony abnormality. Still this area on the dorsal first toe probes to bone. We have been using Prisma Objective Constitutional Patient is hypertensive.. Pulse regular and within target range for patient.Marland White Respirations regular, non-labored and within target range.. Temperature is normal and within the target range for the patient.Marland White appears in no distress. Vitals Time Taken: 11:13 AM, Height:  64 in, Weight: 200 lbs, BMI: 34.3, Temperature: 97.8 F, Pulse: 72 bpm, Respiratory Rate: 17 breaths/min, Blood Pressure: 162/60 mmHg. Cardiovascular Pedal pulses absent on the right.. Edema present in both extremities.. General Notes: Wound exam All the patient's wounds are closed except for a small open area in the proximal scar although this appears better. The other area is on the dorsal right toe this probes to bone and is deep. No evidence of infection area debrided with a #3 curet specimen of "bone" obtained for culture Integumentary (Hair, Skin) Wound #6 status is Open. Original cause of wound was Gradually Appeared. The wound is located on the Right,Proximal Lower Leg. The wound measures 0.5cm length x 0.3cm width x 0.1cm depth; 0.118cm^2 area and 0.012cm^3 volume. There is Fat Layer (Subcutaneous Tissue) Exposed exposed. There is no tunneling or undermining noted. There is a medium amount of serosanguineous drainage noted. The wound margin is flat and intact. There is large (67-100%) red, friable granulation within the wound bed. There is no necrotic tissue within the wound bed. The periwound skin appearance exhibited: Hemosiderin Staining, Mottled. The periwound skin appearance did not exhibit: Callus, Crepitus, Excoriation, Induration, Rash, Scarring, Dry/Scaly, Maceration, Atrophie Blanche, Cyanosis, Ecchymosis, Pallor, Rubor, Erythema. Periwound temperature was noted as No Abnormality. Wound #7 status is Open. Original cause of wound was Gradually Appeared. The wound is located on the Right,Dorsal Metatarsal head first. The wound measures 2.5cm length x 0.9cm width x 0.5cm depth; 1.767cm^2 area and 0.884cm^3 volume. There is Fat Layer (Subcutaneous Tissue) Exposed exposed. There is no tunneling or undermining noted. There is a large amount of serous drainage noted. The wound margin is flat and intact. There is medium (34-66%) pink, friable granulation within the wound bed. There  is a medium (34-66%) amount of necrotic tissue within the wound bed including Adherent Slough. The periwound skin appearance exhibited: Induration, Scarring, Ecchymosis, Hemosiderin Staining, Mottled. The periwound skin appearance did not exhibit: Maceration. Periwound temperature was noted as No Abnormality. The periwound has tenderness on palpation. 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 There was a Skin/Subcutaneous Tissue Debridement ([id number]) debridement with total area of 2.25 sq cm performed by Ricard Dillon, MD. with the following instrument(s): Curette to remove Non-Viable tissue/material including Bone, Exudate, Fat Layer (and Subcutaneous Tissue) Exposed, Fibrin/Slough, and Subcutaneous after achieving pain control using Lidocaine 4% Topical Solution. 1 Specimen was taken by a Swab and sent to the lab per facility protocol.A time out was conducted at 11:29, prior to the start of the procedure. A Moderate amount of bleeding was controlled with Pressure. The procedure was tolerated well with a pain level of 0 throughout and a pain level of 0 following the procedure. Post Debridement Measurements: 2.5cm length x  0.9cm width x 0.5cm depth; 0.884cm^3 volume. Character of Wound/Ulcer Post Debridement requires further debridement. Severity of Tissue Post Debridement is: Bone involvement without necrosis. Post procedure Diagnosis Wound #7: Same as Pre-Procedure Plan Wound Cleansing: Wound #6 Right,Proximal Lower Leg: Clean wound with Normal Saline. Cleanse wound with mild soap and water Wound #7 Right,Dorsal Metatarsal head first: Braman, Naydeen V. ([id number]) Clean wound with Normal Saline. Cleanse wound with mild soap and water Anesthetic: Wound #6 Right,Proximal Lower Leg: Topical Lidocaine 4% cream applied to wound bed prior to debridement Wound #7 Right,Dorsal Metatarsal head first: Topical Lidocaine 4% cream applied to wound bed prior to debridement Primary Wound Dressing: Wound #6 Right,Proximal Lower Leg: Other: - ENDOFORM Wound #7 Right,Dorsal Metatarsal head first: Other: - ENDOFORM Secondary Dressing: Wound #6 Right,Proximal Lower Leg: Dry Gauze Boardered Foam Dressing Wound #7 Right,Dorsal Metatarsal head first: Dry Gauze Conform/Kerlix Dressing Change Frequency: Wound #6 Right,Proximal Lower Leg: Three times weekly Wound #7 Right,Dorsal Metatarsal head first: Change dressing every day. Follow-up Appointments: Wound #6 Right,Proximal Lower Leg: Return Appointment in 1 week. Wound #7 Right,Dorsal Metatarsal head first: Return Appointment in 1 week. Edema Control: Wound #6 Right,Proximal Lower Leg: Elevate legs to the level of the heart and pump ankles as often as possible Wound #7 Right,Dorsal Metatarsal head first: Elevate legs to the level of the heart and pump ankles as often as possible Home Health: Wound #6 Right,Proximal Lower Leg: Continue Home Health Visits - [facility], teach family member to change on off day. Home Health Nurse may visit [facility] to address patient s wound care needs. FACE TO FACE ENCOUNTER: MEDICARE and  MEDICAID PATIENTS: I certify that this patient is under my care and that I had a face-to-face encounter that meets the physician face-to-face encounter requirements with this patient on this date. The encounter with the patient was in whole or in part for the following MEDICAL CONDITION: (primary reason for [place]) MEDICAL NECESSITY: I certify, that based on my findings, NURSING services are a medically necessary home health service. HOME BOUND STATUS: I certify that my clinical findings support that this patient is homebound (i.e., Due to illness or injury, pt requires aid of supportive devices such as crutches, cane, wheelchairs, walkers, the use of special transportation or the assistance of another person to leave their place of residence. There is a normal inability to leave the home and doing so requires considerable and taxing effort. Other absences are for medical reasons / religious services and are infrequent or of short duration when for other reasons). If current dressing causes regression in wound condition, may D/C ordered dressing product/s and apply Normal Saline Moist Dressing daily until next [place] / Other MD appointment. Notify Wound Geck, Swetha V. ([id number]) [facility] of regression in wound condition at [phone]. Please direct any NON-WOUND related issues/requests for orders to patient's Primary Care Physician Wound #7 Right,Dorsal Metatarsal head first: [place] Visits - [facility] twice weekly, [facility], teach family member to change on off day. Home Health Nurse may visit [facility] to address patient s wound care needs. FACE TO FACE ENCOUNTER: MEDICARE and MEDICAID PATIENTS: I certify that this patient is under my care and that I had a face-to-face encounter that meets the physician face-to-face encounter requirements with this patient on this date. The encounter with the patient was in whole or in part for  the following MEDICAL CONDITION: (primary reason for [place]) MEDICAL NECESSITY: I certify, that based on my findings, NURSING services are a medically necessary home health service. HOME BOUND STATUS: I certify that my clinical findings support that this patient is homebound (i.e., Due to illness or injury, pt requires aid of supportive devices such as crutches, cane, wheelchairs, walkers, the use of special transportation or the assistance of another person to leave their place of residence. There is a normal inability to leave the home and doing so requires considerable and taxing effort. Other absences are for medical reasons / religious services and are infrequent or of short duration when for other reasons). If current dressing causes regression in wound condition, may D/C ordered dressing product/s and apply Normal Saline Moist Dressing daily until next [place] / Other MD appointment. [place] of regression in wound condition at [phone]. Please direct any NON-WOUND related issues/requests for orders to patient's Primary Care Physician Laboratory ordered were: Wound culture routine - BONE #1 cultures sent from vigorous debridement of the bone in the right great toe. I will wait culture results #2 plain x-ray was negative, the patient may need an MRI and/or a trip back to see vascular surgery #3 change the primary dressing to Endoform #4 she apparently does not have the resources for an advanced treatment option Electronic Signature(s) Signed: 01/08/2017 4:31:20 PM By: Linton Ham MD Entered By: Linton Ham on 01/08/2017 12:21:47 Edick, Ardyce [name] ([id number]) -------------------------------------------------------------------------------- [place] Details Patient Name: Schenk, Jetty V. Date of Service: 01/08/2017 Medical Record Patient Account Number: [id number] Number: Treating RN: Baruch Gouty, RN, BSN, Velva Harman 1940-10-11 [phone] y.o. 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 I70.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 of foot Facility Procedures CPT4: Description Modifier Quantity Code [id number] - DEB BONE 20 SQ CM/< 1 ICD-10 Description Diagnosis E11.622 Type 2 diabetes mellitus with other skin ulcer 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 ([id number])  Signed: 01/08/2017 4:31:20 PM By: Linton Ham MD Entered By: Linton Ham on 01/08/2017 12:22:20

## 2017-01-10 LAB — AEROBIC CULTURE W GRAM STAIN (SUPERFICIAL SPECIMEN)

## 2017-01-10 LAB — AEROBIC CULTURE  (SUPERFICIAL SPECIMEN)

## 2017-01-15 ENCOUNTER — Ambulatory Visit: Payer: Medicare HMO | Admitting: Internal Medicine

## 2017-01-15 ENCOUNTER — Encounter: Payer: Medicare HMO | Admitting: Internal Medicine

## 2017-01-15 DIAGNOSIS — E11622 Type 2 diabetes mellitus with other skin ulcer: Secondary | ICD-10-CM | POA: Diagnosis not present

## 2017-01-16 NOTE — Progress Notes (Signed)
ZAKARI, COUCHMAN ([id number]) Visit Report for 01/15/2017 Chief Complaint Document Details Patient Name: [name], [name] Date of Service: 01/15/2017 9:15 AM Medical Record Patient Account Number: [id number] Number: Treating RN: Baruch Gouty, RN, BSN, Velva Harman 26-Jun-1941 [phone] y.o. Other Clinician: Date of Birth/Sex: Female) Treating Jocabed Cheese Primary Care Provider: Beverlyn Roux Provider/Extender: G Referring Provider: Dorthula Nettles in Treatment: 10 Information Obtained from: Patient Chief Complaint The patient is here for initial evaluation of RLE and Right foot/toe ulcers Electronic Signature(s) Signed: 01/15/2017 5:26:18 PM By: Linton Ham MD Entered By: Linton Ham on 01/15/2017 09:46:07 [name], [name] ([id number]) -------------------------------------------------------------------------------- Debridement Details Patient Name: [name], [name] Date of Service: 01/15/2017 9:15 AM Medical Record Patient Account Number: [id number] Number: Treating RN: Baruch Gouty, RN, BSN, Velva Harman 1941-07-31 [phone] y.o. Other Clinician: Date of Birth/Sex: Female) Treating Dominika Losey, Gaston Primary Care Provider: Beverlyn Roux Provider/Extender: G Referring Provider: Dorthula Nettles in Treatment: 10 Debridement Performed for Wound #7 Right,Dorsal Metatarsal head first Assessment: Performed By: Physician Ricard Dillon, MD Debridement: Debridement Severity of Tissue Pre Bone involvement without necrosis Debridement: Pre-procedure Verification/Time Out Yes - 09:26 Taken: Start Time: 09:26 Pain Control: Lidocaine 4% Topical Solution Level: Skin/Subcutaneous Tissue Total Area Debrided (L x 2.6 (cm) x 1 (cm) = 2.6 (cm) W): Tissue and other Non-Viable, Fat, Fibrin/Slough, Subcutaneous material debrided: Instrument: Curette Bleeding: Minimum Hemostasis Achieved: Pressure End Time: 09:29 Procedural Pain: 0 Post Procedural Pain: 0 Response to Treatment: Procedure was  tolerated well Post Debridement Measurements of Total Wound Length: (cm) 2.6 Width: (cm) 1 Depth: (cm) 0.5 Volume: (cm) 1.021 Character of Wound/Ulcer Post Requires Further Debridement Debridement: Bone involvement without Severity of Tissue Post Debridement: necrosis Post Procedure Diagnosis Same as Pre-procedure [name] ([id number]) Electronic Signature(s) Signed: 01/15/2017 5:05:29 PM By: Regan Lemming BSN, RN Signed: 01/15/2017 5:26:18 PM By: Linton Ham MD Entered By: Linton Ham on 01/15/2017 09:45:57 [name], [name] ([id number]) -------------------------------------------------------------------------------- HPI Details Patient Name: [name], [name] Date of Service: 01/15/2017 9:15 AM Medical Record Patient Account Number: [id number] Number: Treating RN: Baruch Gouty, RN, BSN, Velva Harman September 19, 1940 [phone] y.o. Other Clinician: Date of Birth/Sex: Female) Treating Laree Garron Primary Care Provider: Beverlyn Roux Provider/Extender: G Referring Provider: Dorthula Nettles in Treatment: 10 History of Present Illness HPI Description: 08/13/16: This is a 75 year old woman who came predominantly for review of 3 cm in diameter circular wound to the left anterior lateral leg. She was in the ER on 08/01/16 I reviewed their notes. There was apparently pus coming out of the wound at that time and the patient arrived requesting debridement which they don't do in the emergency room. Nevertheless I can't see that they did any x-rays. There were no cultures done. She is a type II diabetic and I a note after the patient was in the clinic that she had a bypass graft from the popliteal to the tibial on the right on 02/28/16. She also had a right greater saphenous vein harvest on the same date for arterial bypass. She is going to have vascular studies including ABIs T ABIs on the right on 08/28/16. The patient's surgery was on 02/28/16 by Dr. Vallarie Mare she had a right below the knee  popliteal artery to peroneal artery bypass with reverse greater saphenous vein and an endarterectomy of the mid segment peroneal artery. Postoperatively she had a strong mild monophasic peroneal signal with a pink foot. It would appear that the patient is had some nonhealing in the surgical saphenous vein harvest site on the  left leg. [facility] I cannot see much information about this at all. Dr. Lucious Groves notes from 05/29/16 show that the patient's wounds "are not healed" the right first metatarsal wound healed but then opened back up. 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 At that point the patient had a wound VAC to her right calf with regards to the wounds on her dorsal right toe would appear that these are felt to be arterial wounds. She has had surgery on the metatarsal phalangeal in 2015 I Dr. Doran Durand secondary to a right metatarsal phalangeal joint fracture. She is apparently had discoloration around this area since then. 08/28/16; patient arrives with her wounds in much the same condition. The linear vein harvest site and the circular wound below it which I think was a blister. She also has to probing holes in her right great toe and a necrotic eschar on the right second toe. Because of these being arterial wounds I reduced her compression from 3-2 layers this seems to of done satisfactorily she has not had any problems. I cannot see that she is actually had an x-ray ====== 11/06/16 the patient comes in for evaluation of her right lower extremity ulcers. She was here in January 2018 for 2 visits subsequently ended up in the hospital with pneumonia and then to rehabilitation. She has now been discharged from rehabilitation and is home. She has multiple ulcerations to her right lower extremity including the foot and toes. She does have home health in  place and they have been placing alginate to the ulcers. She is followed by Dr. Bridgett Larsson of vascular medicine. 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 She recently saw him on 3/23. In office ABIs were: Gallicchio, SEMA STANGLER. ([id number]) Right 0.48 with monophasic flow to the DP, PT, peroneal Left 0.63 with monophasic flow to the DP and PT Her arterial studies indicated a patent right below knee popliteal to peroneal bypass She had an MRI in February 2008 that was negative frosty myelitis but this showed general soft tissue edema in the right foot and lower extremity concerning for cellulitis She is a diabetic, managed with insulin. Her hemoglobin A1c in December 2017 was 8.4 which is a trend up from previous levels. She had blood work in February 2018 which revealed an albumin of 2.6 this appears to be relatively acute as an albumin in November 2017 was 3.7 11/13/16; this is a patient I have not seen since February who is readmitted to our clinic last week. 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 Bridgett Larsson. I have reviewed Dr. Lianne Moris notes from March/23/18. Doppler ABI on that date showed an ABI on the right of 0.48 and on the left of 0.63. Dorsalis pedis waveforms were monophasic bilaterally. There was no waveforms detected at the posterior tibial on the right, monophasic on the left. Dr. Lianne Moris comments were that this patient would have follow-up vascular studies in 3 months including ABIs and right lower extremity arterial duplex. She had an MRI in February that was negative for osteomyelitis but showed generalized edema in the foot. Last albumin I see was in January at 3.4 we have been using Santyl to the 4 wounds on the right leg the patient is noted today to  have widespread edema well up towards her groin this is pitting 2-3+. 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 Normal ejection fraction. 11/20/16; patient has a follow-up appointment with Dr. Bridgett Larsson on April 23. She is still complaining of a lot of pain in the right foot and right leg. It is not clear to me that this is at all positional however I think it is clear claudication with minimal activity perhaps at rest. At our suggestion she is return to her primary physician's office tomorrow with regards to her pitting lower extremity bilateral edema that I reviewed in detail last week 11/27/16; the follow-up with Dr. Bridgett Larsson was actually on May 23 on April 23 as I stated in my note last week. N/A case all of her wounds seems somewhat smaller. The 2 on the right leg are definitely smaller. The areas on the dorsal right first toe, right third toe and the lateral part of the right fifth metatarsal head all looks smaller but have tightly adherent surfaces. We have been using Santyl 12/04/16; follow-up with Dr. Bridgett Larsson on May 23. 2 small open wounds on the right leg continue to get smaller. The area on the right third total lateral aspect of the right fifth toe also look better. The remaining area on the dorsal first toe still has some depth to it. We have been using Santyl to the toes and collagen on the right 12/11/16; according to patient's husband the follow-up with Dr. Bridgett Larsson is not until July. All of the wounds on the right leg are measuring smaller. We have been using some combination of Prisma and Santyl although I think we can go to straight Prisma today. There may have been some confusion with home health about the primary dressing orders here. 12/25/16; the patient has had some healing this week. The area on her right lateral fifth metatarsal head, right third toe are both healed lower right leg is healed. In the vein harvest site superiorly she has one superficial open area. On the dorsal aspect of her right great toe/MTP joint the wound is now  divided into 2 however the proximal area is deep and there is palpable bone 01/01/17; still an open area in the middle of her original right surgical scar. The area on the right third toe and right lateral fifth metatarsal head remained closed. Problematic area on the dorsal aspect of the toe. Previous surgery in this area line 01/08/17 small open area on the original scar on her upper anterior leg although this is closing. X-ray I did of the right first toe did not show underlying bony abnormality. Still this area on the dorsal first toe probes to bone. We have been using Prisma 01/15/17; small open area in the original scar in the upper anterior leg is almost fully closed. She has 2 open areas over the dorsal aspect of the right first toe that probes to bone. Used and a form starting last week. Vigorous bone scraping that I did last week showed few methicillin sensitive staph aureus. She is allergic to Mahajan, Shakthi V. ([id number]) penicillin and sulfa drugs. I'm going to give her 2 weeks of doxycycline. She will need an MRI with contrast. 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 Date of Service: 01/15/2017 9:15 AM Medical Record Patient Account Number: [id number] Number: Treating RN: Baruch Gouty, RN, BSN, Velva Harman 1941-06-08 [phone] y.o. Other Clinician: Date of Birth/Sex: Female) Treating Hallie Ishida Primary Care Provider: Beverlyn Roux Provider/Extender: G Referring Provider: Dorthula Nettles in Treatment: 10 Constitutional Sitting or standing Blood Pressure is within target range for patient.. Pulse regular and within target range for patient.[name] Respirations regular,  non-labored and within target range.. Temperature is normal and within the target range for the patient.[name] appears in no distress. Notes Wound exam; all the patient's wounds are closed except for the open area on her first toe o2. Both of these have deteriorated and now probed to bone. There is no overt surrounding soft tissue infection. Electronic Signature(s) Signed: 01/15/2017 5:26:18 PM By: Linton Ham MD Entered By: Linton Ham on 01/15/2017 09:48:15 [name], [name] ([id number]) -------------------------------------------------------------------------------- Physician Orders Details Patient Name: Monjaras, Hina V. Date of Service: 01/15/2017 9:15 AM Medical Record Patient Account Number: [id number] Number: Treating RN: Baruch Gouty, RN, BSN, Velva Harman 1941-06-16 [phone] y.o. Other Clinician: Date of Birth/Sex: Female) Treating Pearley Millington Primary Care Provider: Beverlyn Roux Provider/Extender: G Referring Provider: Dorthula Nettles in Treatment: 10 Verbal / Phone Orders: No Diagnosis Coding Wound Cleansing Wound #6 Right,Proximal Lower Leg o Clean wound with Normal Saline. o Cleanse wound with mild soap and water Wound #7 Right,Dorsal Metatarsal head first o Clean wound with Normal Saline. o Cleanse wound with mild soap and water Anesthetic Wound #6 Right,Proximal Lower Leg o Topical Lidocaine 4% cream applied to wound bed prior to debridement Wound #7 Right,Dorsal Metatarsal head first o Topical Lidocaine 4% cream applied to wound bed prior to debridement Primary Wound Dressing Wound #6 Right,Proximal Lower Leg o Other: - ENDOFORM Wound #7 Right,Dorsal Metatarsal head first o Other: - ENDOFORM Secondary Dressing Wound #6 Right,Proximal Lower Leg o Dry Gauze o Boardered Foam Dressing Wound #7 Right,Dorsal Metatarsal head first o Dry Gauze o Conform/Kerlix Dressing Change Frequency Wound #6 Right,Proximal Lower Leg [name], [name]  ([id number]) o Three times weekly Wound #7 Right,Dorsal Metatarsal head first o Change dressing every day. Follow-up Appointments Wound #6 Right,Proximal Lower Leg o Return Appointment in 1 week. Wound #7 Right,Dorsal Metatarsal head first o Return Appointment in 1 week. Edema Control Wound #6 Right,Proximal Lower Leg o Elevate legs to the level of the heart and pump ankles as often as possible Wound #7 Right,Dorsal Metatarsal head first o Elevate legs to the level of the heart and pump ankles as often as possible Home Health Wound #6 Right,Proximal Lower Leg o [place] Visits - [facility] twice weekly, [facility]-Wednesday, teach family member to change on off day. o Home Health Nurse may visit PRN to address patientos wound care needs. o FACE TO FACE ENCOUNTER: MEDICARE and MEDICAID PATIENTS: I certify that this patient is under my care and that I had a face-to-face encounter that meets the physician face-to-face encounter requirements with this patient on this date. The encounter with the patient was in whole or in part for the following MEDICAL CONDITION: (primary reason for [place]) MEDICAL NECESSITY: I certify, that based on my findings, NURSING services are a medically necessary home health service. HOME BOUND STATUS: I certify that my clinical findings support that this patient is homebound (i.e., Due to illness or injury, pt requires aid of supportive devices such as crutches, cane, wheelchairs, walkers, the use of special transportation or the assistance of another person to leave their place  of residence. There is a normal inability to leave the home and doing so requires considerable and taxing effort. Other absences are for medical reasons / religious services and are infrequent or of short duration when for other reasons). o If current dressing causes regression in wound condition, may D/C ordered dressing product/s and apply  Normal Saline Moist Dressing daily until next [place] / Other MD appointment. [place] of regression in wound condition at [phone]. o Please direct any NON-WOUND related issues/requests for orders to patient's Primary Care Physician Wound #7 Right,Dorsal Metatarsal head first o [place] Visits - [facility] twice weekly, [facility]-Wednesday, teach family member to change on off day. o Home Health Nurse may visit PRN to address patientos wound care needs. o FACE TO FACE ENCOUNTER: MEDICARE and MEDICAID PATIENTS: I certify that this patient is under my care and that I had a face-to-face encounter that meets the physician face-to-face [name], [name] SPEASE. ([id number]) encounter requirements with this patient on this date. The encounter with the patient was in whole or in part for the following MEDICAL CONDITION: (primary reason for [place]) MEDICAL NECESSITY: I certify, that based on my findings, NURSING services are a medically necessary home health service. HOME BOUND STATUS: I certify that my clinical findings support that this patient is homebound (i.e., Due to illness or injury, pt requires aid of supportive devices such as crutches, cane, wheelchairs, walkers, the use of special transportation or the assistance of another person to leave their place of residence. There is a normal inability to leave the home and doing so requires considerable and taxing effort. Other absences are for medical reasons / religious services and are infrequent or of short duration when for other reasons). o If current dressing causes regression in wound condition, may D/C ordered dressing product/s and apply Normal Saline Moist Dressing daily until next [place] / Other MD appointment. [place] of regression in wound condition at [phone]. o Please direct any NON-WOUND related issues/requests for orders to  patient's Primary Care Physician Medications-please add to medication list. Wound #6 Right,Proximal Lower Leg o P.O. Antibiotics Wound #7 Right,Dorsal Metatarsal head first o P.O. Antibiotics Radiology o MRI, lower extremity without contrast - right foot Patient Medications Allergies: penicillin, sulfamethizole, iodine, Shellfish Containing Products, eggshell membrane Notifications Medication Indication Start End doxycycline monohydrate cellulitis foot 01/15/2017 DOSE bid - oral 100 mg capsule - bid capsule oral Electronic Signature(s) Signed: 01/15/2017 9:37:18 AM By: Linton Ham MD Entered By: Linton Ham on 01/15/2017 09:37:16 Melick, Claryssa [name] ([id number]) -------------------------------------------------------------------------------- Problem List Details Patient Name: [name], [name] Date of Service: 01/15/2017 9:15 AM Medical Record Patient Account Number: [id number] Number: Treating RN: Baruch Gouty, RN, BSN, Velva Harman 1941-04-15 [phone] y.o. 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 Date of Service: 01/15/2017 9:15 AM Medical Record Patient Account Number: [id number] Number: Treating RN: Baruch Gouty, RN, BSN, Velva Harman 1941-05-09 [phone] y.o. Other Clinician: Date of Birth/Sex: Female) Treating Marrell Dicaprio Primary Care Provider: Beverlyn Roux Provider/Extender: G Referring Provider: Dorthula Nettles in Treatment: 10 Subjective Chief Complaint Information obtained from Patient The patient is here for initial evaluation of RLE and Right foot/toe ulcers History of Present Illness (HPI) 08/13/16: This is a 76 year old woman who came predominantly for review of 3 cm in diameter circular wound to the left anterior lateral leg. She was in the ER on 08/01/16 I reviewed their notes. There was apparently pus coming out of the wound at that time and the patient arrived requesting debridement which they don't do in the emergency room. Nevertheless I can't see that they did any x-rays. There were no cultures done. She is a type II diabetic and I a note after the patient was in the clinic that she had a bypass graft from the popliteal to the tibial on the right on 02/28/16. She also had a right greater saphenous vein harvest on the same date for arterial bypass. She is going to have vascular studies including ABIs T ABIs on the right on 08/28/16. The patient's surgery was on 02/28/16 by Dr. Vallarie Mare she had a right below the knee popliteal artery to peroneal artery bypass with reverse greater saphenous vein and an endarterectomy of the mid segment peroneal artery. Postoperatively she had a strong mild monophasic peroneal signal with a pink foot. It would appear that the patient is had  some nonhealing in the surgical saphenous vein harvest site on the left leg. Surprisingly [facility] I cannot see much information about this at all. Dr. Lucious Groves notes from 05/29/16 show that the patient's wounds "are not healed" the right first metatarsal wound healed but then opened back up. 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 At that point the patient had a wound VAC to her right calf with regards to the wounds on her dorsal right toe would appear that these are felt to be arterial wounds. She has had surgery on the metatarsal phalangeal in 2015 I Dr. Doran Durand secondary to a right metatarsal phalangeal joint fracture. She is apparently had discoloration around this area since then. 08/28/16; patient arrives with her wounds in much the same condition. The linear vein harvest site and the circular wound below it which I think was a blister. She also has to probing holes in her right great toe and a necrotic eschar on the right second toe. Because of these being arterial wounds I reduced her compression from 3-2 layers this seems to of done satisfactorily she has not had any problems. I cannot see that she is actually had an x-ray ====== 11/06/16 the patient comes in for evaluation of her right lower extremity ulcers. She was here in January 2018 for 2 visits subsequently ended up in the hospital with pneumonia and then to rehabilitation. She has now been [place], Antonito ([id number]) discharged from rehabilitation and is home. She has multiple ulcerations to her right lower extremity including the foot and toes. She does have home health in place  and they have been placing alginate to the ulcers. She is followed by Dr. Bridgett Larsson of vascular medicine. 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 She recently saw  him on 3/23. In office ABIs were: Right 0.48 with monophasic flow to the DP, PT, peroneal Left 0.63 with monophasic flow to the DP and PT Her arterial studies indicated a patent right below knee popliteal to peroneal bypass She had an MRI in February 2008 that was negative frosty myelitis but this showed general soft tissue edema in the right foot and lower extremity concerning for cellulitis She is a diabetic, managed with insulin. Her hemoglobin A1c in December 2017 was 8.4 which is a trend up from previous levels. She had blood work in February 2018 which revealed an albumin of 2.6 this appears to be relatively acute as an albumin in November 2017 was 3.7 11/13/16; this is a patient I have not seen since February who is readmitted to our clinic last week. 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 Bridgett Larsson. I have reviewed Dr. Lianne Moris notes from March/23/18. Doppler ABI on that date showed an ABI on the right of 0.48 and on the left of 0.63. Dorsalis pedis waveforms were monophasic bilaterally. There was no waveforms detected at the posterior tibial on the right, monophasic on the left. Dr. Lianne Moris comments were that this patient would have follow-up vascular studies in 3 months including ABIs and right lower extremity arterial duplex. She had an MRI in February that was negative for osteomyelitis but showed generalized edema in the foot. Last albumin I see was in January at 3.4 we have been using Santyl to the 4 wounds on the right leg the patient is noted today to have widespread edema well up towards her groin this is pitting 2-3+. 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 Normal ejection fraction. 11/20/16; patient has a follow-up appointment with Dr. Bridgett Larsson on April 23. She is still complaining of a lot of pain in the right foot and right leg. It is not clear to me that  this is at all positional however I think it is clear claudication with minimal activity perhaps at rest. At our suggestion she is return to her primary physician's office tomorrow with regards to her pitting lower extremity bilateral edema that I reviewed in detail last week 11/27/16; the follow-up with Dr. Bridgett Larsson was actually on May 23 on April 23 as I stated in my note last week. N/A case all of her wounds seems somewhat smaller. The 2 on the right leg are definitely smaller. The areas on the dorsal right first toe, right third toe and the lateral part of the right fifth metatarsal head all looks smaller but have tightly adherent surfaces. We have been using Santyl 12/04/16; follow-up with Dr. Bridgett Larsson on May 23. 2 small open wounds on the right leg continue to get smaller. The area on the right third total lateral aspect of the right fifth toe also look better. The remaining area on the dorsal first toe still has some depth to it. We have been using Santyl to the toes and collagen on the right 12/11/16; according to patient's husband the follow-up with Dr. Bridgett Larsson is not until July. All of the wounds on the right leg are measuring smaller. We have been using  some combination of Prisma and Santyl although I think we can go to straight Prisma today. There may have been some confusion with home health about the primary dressing orders here. 12/25/16; the patient has had some healing this week. The area on her right lateral fifth metatarsal head, right third toe are both healed lower right leg is healed. In the vein harvest site superiorly she has one superficial open area. On the dorsal aspect of her right great toe/MTP joint the wound is now divided into 2 however the proximal area is deep and there is palpable bone 01/01/17; still an open area in the middle of her original right surgical scar. The area on the right third toe and right lateral fifth metatarsal head remained closed. Problematic area on the  dorsal aspect of the toe. Previous surgery in this area line 01/08/17 small open area on the original scar on her upper anterior leg although this is closing. X-ray I did of [name], [name] ([id number]) the right first toe did not show underlying bony abnormality. Still this area on the dorsal first toe probes to bone. We have been using Prisma 01/15/17; small open area in the original scar in the upper anterior leg is almost fully closed. She has 2 open areas over the dorsal aspect of the right first toe that probes to bone. Used and a form starting last week. Vigorous bone scraping that I did last week showed few methicillin sensitive staph aureus. She is allergic to penicillin and sulfa drugs. I'm going to give her 2 weeks of doxycycline. She will need an MRI with contrast. 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 Pulse regular and within target range for patient.[name] Respirations regular, non-labored and within target range.. Temperature is normal and within the target range for the patient.[name] appears in no distress. Vitals Time Taken: 9:07 AM, Height: 64 in, Weight: 200 lbs, BMI: 34.3, Temperature: 98.0 F, Pulse: 74 bpm, Respiratory Rate: 16 breaths/min, Blood Pressure: 135/54 mmHg. General Notes: Wound exam; all the patient's wounds are closed except for the open area on her first toe o2. Both of these have deteriorated and now probed to bone. There is no overt surrounding soft tissue infection. Integumentary (Hair, Skin) Wound #6 status is Open. Original cause of wound was Gradually Appeared. The wound is located on the Right,Proximal Lower Leg. The wound measures 0.3cm length x 0.2cm width x 0.1cm depth; 0.047cm^2 area and 0.005cm^3 volume. There is Fat Layer (Subcutaneous Tissue) Exposed exposed. There is no tunneling or undermining noted. There is a medium  amount of serosanguineous drainage noted. The wound margin is flat and intact. There is large (67-100%) red, friable granulation within the wound bed. There is no necrotic tissue within the wound bed. The periwound skin appearance exhibited: Hemosiderin Staining, Mottled. The periwound skin appearance did not exhibit: Callus, Crepitus, Excoriation, Induration, Rash, Scarring, Dry/Scaly, Maceration, Atrophie Blanche, Cyanosis, Ecchymosis, Pallor, Rubor, Erythema. Periwound temperature was noted as No Abnormality. Wound #7 status is Open. Original cause of wound was Gradually Appeared. The wound is located on the Right,Dorsal Metatarsal head first. The wound measures 2.6cm length x 1cm width x 0.5cm depth; 2.042cm^2 area and 1.021cm^3 volume. There is bone and Fat Layer (Subcutaneous Tissue) Exposed exposed. There is no tunneling or undermining noted. There is a large amount of serous drainage noted. The wound margin is flat and  intact. There is small (1-33%) pink, friable granulation within the wound bed. There is a medium (34-66%) amount of necrotic tissue within the wound bed including Adherent Slough. The periwound skin appearance exhibited: Induration, Scarring, Ecchymosis, Hemosiderin Staining, Mottled. The periwound skin appearance did not exhibit: Maceration. Periwound temperature was noted as No Abnormality. The periwound has tenderness on palpation. 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 There was a Skin/Subcutaneous Tissue Debridement ([id number]) debridement with total area of 2.6 sq cm performed by Ricard Dillon, MD. with the following instrument(s): Curette to remove Non-Viable tissue/material including Fat Layer (and Subcutaneous Tissue) Exposed, Fibrin/Slough, and Subcutaneous after achieving pain control using Lidocaine 4% Topical Solution. A time out was conducted at 09:26, prior to the start of the procedure. A Minimum amount of bleeding was controlled with Pressure. The procedure was tolerated well with a pain level of 0 throughout and a pain level of 0 following the procedure. Post Debridement Measurements: 2.6cm length x 1cm width x 0.5cm depth; 1.021cm^3 volume. Character of Wound/Ulcer Post Debridement requires further debridement. Severity of Tissue Post Debridement is: Bone involvement without necrosis. Post procedure Diagnosis Wound #7: Same as Pre-Procedure Plan Wound Cleansing: Wound #6 Right,Proximal Lower Leg: Clean wound with Normal Saline. Cleanse wound with mild soap and water Wound #7 Right,Dorsal Metatarsal head first: Clean wound with Normal Saline. Harts, Mara V. ([id number]) Cleanse wound with mild soap and water Anesthetic: Wound #6 Right,Proximal Lower Leg: Topical Lidocaine 4% cream applied to wound bed prior to debridement Wound #7 Right,Dorsal Metatarsal head first: Topical Lidocaine 4% cream applied to wound bed prior to debridement Primary Wound Dressing: Wound #6 Right,Proximal Lower Leg: Other: - ENDOFORM Wound #7 Right,Dorsal Metatarsal head first: Other: - ENDOFORM Secondary Dressing: Wound #6 Right,Proximal Lower Leg: Dry  Gauze Boardered Foam Dressing Wound #7 Right,Dorsal Metatarsal head first: Dry Gauze Conform/Kerlix Dressing Change Frequency: Wound #6 Right,Proximal Lower Leg: Three times weekly Wound #7 Right,Dorsal Metatarsal head first: Change dressing every day. Follow-up Appointments: Wound #6 Right,Proximal Lower Leg: Return Appointment in 1 week. Wound #7 Right,Dorsal Metatarsal head first: Return Appointment in 1 week. Edema Control: Wound #6 Right,Proximal Lower Leg: Elevate legs to the level of the heart and pump ankles as often as possible Wound #7 Right,Dorsal Metatarsal head first: Elevate legs to the level of the heart and pump ankles as often as possible Home Health: Wound #6 Right,Proximal Lower Leg: Continue Home Health Visits - [facility], teach family member to change on off day. Home Health Nurse may visit [facility] to address patient s wound care needs. FACE TO FACE ENCOUNTER: MEDICARE and MEDICAID PATIENTS: I certify that this patient is under my care and that I had a face-to-face encounter that meets the physician face-to-face encounter requirements with this patient on this date.  The encounter with the patient was in whole or in part for the following MEDICAL CONDITION: (primary reason for [place]) MEDICAL NECESSITY: I certify, that based on my findings, NURSING services are a medically necessary home health service. HOME BOUND STATUS: I certify that my clinical findings support that this patient is homebound (i.e., Due to illness or injury, pt requires aid of supportive devices such as crutches, cane, wheelchairs, walkers, the use of special transportation or the assistance of another person to leave their place of residence. There is a normal inability to leave the home and doing so requires considerable and taxing effort. Other absences are for medical reasons / religious services and are infrequent or of short duration when  for other reasons). If current dressing causes regression in wound condition, may D/C ordered dressing product/s and apply Normal Saline Moist Dressing daily until next [place] / Other MD appointment. [place] of regression in wound condition at [phone]. LYNISHA, OSUCH ([id number]) Please direct any NON-WOUND related issues/requests for orders to patient's Primary Care Physician Wound #7 Right,Dorsal Metatarsal head first: [place] Visits - [facility] weekly, [facility], teach family member to change on off day. Home Health Nurse may visit [facility] to address patient s wound care needs. FACE TO FACE ENCOUNTER: MEDICARE and MEDICAID PATIENTS: I certify that this patient is under my care and that I had a face-to-face encounter that meets the physician face-to-face encounter requirements with this patient on this date. The encounter with the patient was in whole or in part for the following MEDICAL CONDITION: (primary reason for [place]) MEDICAL NECESSITY: I certify, that based on my findings, NURSING services are a medically necessary home health service. HOME BOUND STATUS: I certify that my clinical findings support that this patient is homebound (i.e., Due to illness or injury, pt requires aid of supportive devices such as crutches, cane, wheelchairs, walkers, the use of special transportation or the assistance of another person to leave their place of residence. There is a normal inability to leave the home and doing so requires considerable and taxing effort. Other absences are for medical reasons / religious services and are infrequent or of short duration when for other reasons). If current dressing causes regression in wound condition, may D/C ordered dressing product/s and apply Normal Saline Moist Dressing daily until next [place] / Other MD appointment. [place] of regression in  wound condition at [phone]. Please direct any NON-WOUND related issues/requests for orders to patient's Primary Care Physician Medications-please add to medication list.: Wound #6 Right,Proximal Lower Leg: P.O. Antibiotics Wound #7 Right,Dorsal Metatarsal head first: P.O. Antibiotics Radiology ordered were: MRI, lower extremity without contrast - right foot The following medication(s) was prescribed: doxycycline monohydrate oral 100 mg capsule bid bid capsule oral for cellulitis foot starting 01/15/2017 #1 doxycycline 100 twice a day for 2 weeks #2 bone scraping from last week grew methicillin sensitive staph aureus. This is the reason for the doxycycline consider addition of Flagyl #3 MRI of the right foot with contrast. If this is positive consider IV antibiotics #4 may require a trip back to vascular surgery Electronic Signature(s) Signed: 01/15/2017 5:26:18 PM By: Linton Ham MD Entered By: Linton Ham on 01/15/2017 09:49:29 [place], CATORI PANOZZO ([id number]) [place], [place] ([id number]) -------------------------------------------------------------------------------- [place] Details Patient Name: Railey, Trenia V. Date of Service: 01/15/2017 Medical Record Patient Account Number: [id number] Number: Treating RN: Baruch Gouty, RN, BSN, Velva Harman 1940/09/15 [phone] y.o. 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 E11.52 Type 2 diabetes mellitus with diabetic peripheral angiopathy with gangrene I70.232 Atherosclerosis of native  arteries of right leg with ulceration of calf I70.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 of foot Facility Procedures CPT4: Description Modifier Quantity Code [id number] 11042 - DEB SUBQ TISSUE 20 SQ CM/< 1 ICD-10 Description Diagnosis 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

## 2017-01-16 NOTE — Progress Notes (Signed)
[name], [name] ([id number]) Visit Report for 01/15/2017 Arrival Information Details Patient Name: [name], [name] Date of Service: 01/15/2017 9:15 AM Medical Record Number: [id number] Patient Account Number: [id number] Date of Birth/Sex: February 22, 1941 (76 y.o. Female) Treating RN: Afful, RN, BSN, Velva Harman Primary Care Thu Baggett: Beverlyn Roux Other Clinician: Referring Baljit Liebert: Beverlyn Roux Treating Loyal Holzheimer/Extender: Tito Dine in Treatment: 10 Visit Information History Since Last Visit All ordered tests and consults were completed: No Patient Arrived: Wheel Chair Added or deleted any medications: No Arrival Time: 09:05 Any new allergies or adverse reactions: No Accompanied By: hubby Had a fall or experienced change in No activities of daily living that may affect Transfer Assistance: Manual risk of falls: Patient Identification Verified: Yes Signs or symptoms of abuse/neglect since last No Secondary Verification Process Yes visito Completed: Hospitalized since last visit: No Patient Requires Transmission-Based No Has Dressing in Place as Prescribed: Yes Precautions: Pain Present Now: No Patient Has Alerts: No Electronic Signature(s) Signed: 01/15/2017 5:05:29 PM By: Regan Lemming BSN, RN Entered By: Regan Lemming on 01/15/2017 09:06:09 [name] ([id number]) -------------------------------------------------------------------------------- Encounter Discharge Information Details Patient Name: [name], [name] Date of Service: 01/15/2017 9:15 AM Medical Record Number: [id number] Patient Account Number: [id number] Date of Birth/Sex: 1941-05-10 (76 y.o. Female) Treating RN: Afful, RN, BSN, Velva Harman Primary Care Moua Rasmusson: Beverlyn Roux Other Clinician: Referring Norberta Stobaugh: Beverlyn Roux Treating Wilson Sample/Extender: Tito Dine in Treatment: 10 Encounter Discharge Information Items Discharge Pain Level: 0 Discharge Condition: Stable Ambulatory Status:  Wheelchair Discharge Destination: Home Transportation: Private Auto Accompanied By: Micheline Rough Schedule Follow-up Appointment: No Medication Reconciliation completed and provided to Patient/Care No Vilma Will: Provided on Clinical Summary of Care: 01/15/2017 Form Type Recipient Paper Patient [facility] Electronic Signature(s) Signed: 01/15/2017 9:45:03 AM By: Ruthine Dose Entered By: Ruthine Dose on 01/15/2017 09:45:03 Larsh, [name] ([id number]) -------------------------------------------------------------------------------- Lower Extremity Assessment Details Patient Name: [name], [name] Date of Service: 01/15/2017 9:15 AM Medical Record Number: [id number] Patient Account Number: [id number] Date of Birth/Sex: 12-May-1941 (76 y.o. Female) Treating RN: Afful, RN, BSN, Velva Harman Primary Care Reid Nawrot: Beverlyn Roux Other Clinician: Referring Antoneo Ghrist: Beverlyn Roux Treating Brigham Cobbins/Extender: Tito Dine in Treatment: 10 Edema Assessment Assessed: [Left: No] [Right: No] E[Left: dema] [Right: :] Calf Left: Right: Point of Measurement: 34 cm From Medial Instep cm cm Ankle Left: Right: Point of Measurement: 11 cm From Medial Instep cm cm Vascular Assessment Claudication: Claudication Assessment [Right:None] Pulses: Dorsalis Pedis Palpable: [Right:Yes] Posterior Tibial Extremity colors, hair growth, and conditions: Extremity Color: [Right:Mottled] Hair Growth on Extremity: [Right:No] Temperature of Extremity: [Right:Warm] Capillary Refill: [Right:< 3 seconds] Toe Nail Assessment Left: Right: Thick: Yes Discolored: Yes Deformed: No Improper Length and Hygiene: Yes Electronic Signature(s) Signed: 01/15/2017 5:05:29 PM By: Regan Lemming BSN, RN Entered By: Regan Lemming on 01/15/2017 09:07:13 [name], [name] ([id number]) [name], [name] ([id number]) -------------------------------------------------------------------------------- Multi Wound Chart Details Patient Name: [name], [name] Date of Service: 01/15/2017 9:15 AM Medical Record Number: [id number] Patient Account Number: [id number] Date of Birth/Sex: 03/23/1941 (76 y.o. 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] [  7: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] Comorbid History: [6:Cataracts, Chronic sinus problems/congestion, Congestive Heart Failure, Hypertension, Type II Diabetes] [7:Cataracts, Chronic sinus N/A problems/congestion, Congestive Heart Failure, Hypertension, Type II Diabetes] Date Acquired: [6:09/09/2016] [7:08/06/2013] [N/A:N/A] Weeks of Treatment: [6:10] [7:10] [N/A:N/A] Wound Status: [6:Open] [7:Open] [N/A:N/A] Measurements L x W x D 0.3x0.2x0.1 [7:2.6x1x0.5] [N/A:N/A] (cm) Area (cm) : [6:0.047] [7:2.042] [N/A:N/A] Volume (cm) : [6:0.005] [7:1.021] [N/A:N/A] % Reduction in Area: [6:85.00%] [7:-73.30%] [N/A:N/A] % Reduction in Volume: 83.90% [7:-189.20%] [N/A:N/A] Classification: [6:Grade 2] [7:Grade 2] [N/A:N/A] Exudate Amount: [6:Medium] [7:Large] [N/A:N/A] Exudate Type: [6:Serosanguineous] [7:Serous] [N/A:N/A] Exudate Color: [6:red, brown] [7:amber] [N/A:N/A] Wound Margin: [6:Flat and Intact] [7:Flat and Intact] [N/A:N/A] Granulation Amount: [6:Large (67-100%)] [7:Small (1-33%)] [N/A:N/A] Granulation Quality: [6:Red, Friable] [7:Pink, Friable] [N/A:N/A] Necrotic Amount: [6:None Present  (0%)] [7:Medium (34-66%)] [N/A:N/A] Exposed Structures: [6:Fat Layer (Subcutaneous Tissue) Exposed: Yes] [7:Fat Layer (Subcutaneous Tissue) Exposed: Yes] [N/A:N/A] Fascia: No Bone: Yes Tendon: No Fascia: No Muscle: No Tendon: No Joint: No Muscle: No Bone: No Joint: No Epithelialization: Large (67-100%) Small (1-33%) N/A Debridement: N/A Debridement ([id number]- N/A 11047) Pre-procedure N/A 09:26 N/A Verification/Time Out Taken: Pain Control: N/A Lidocaine 4% Topical N/A Solution Tissue Debrided: N/A Fibrin/Slough, Fat, N/A Subcutaneous Level: N/A Skin/Subcutaneous N/A Tissue Debridement Area (sq N/A 2.6 N/A cm): Instrument: N/A Curette N/A Bleeding: N/A Minimum N/A Hemostasis Achieved: N/A Pressure N/A Procedural Pain: N/A 0 N/A Post Procedural Pain: N/A 0 N/A Debridement Treatment N/A Procedure was tolerated N/A Response: well Post Debridement N/A 2.6x1x0.5 N/A Measurements L x W x D (cm) Post Debridement N/A 1.021 N/A Volume: (cm) Periwound Skin Texture: Excoriation: No Induration: Yes N/A Induration: No Scarring: Yes Callus: No Crepitus: No Rash: No Scarring: No Periwound Skin Maceration: No Maceration: No N/A Moisture: Dry/Scaly: No Periwound Skin Color: Hemosiderin Staining: Yes Ecchymosis: Yes N/A Mottled: Yes Hemosiderin Staining: Yes Atrophie Blanche: No Mottled: Yes Cyanosis: No Ecchymosis: No Erythema: No Pallor: No Rubor: No Temperature: No Abnormality No Abnormality N/A Tenderness on No Yes N/A Palpation: [name], [name] ([id number]) Wound Preparation: Ulcer Cleansing: Ulcer Cleansing: N/A Rinsed/Irrigated with Rinsed/Irrigated with Saline Saline Topical Anesthetic Topical Anesthetic Applied: None Applied: Other: lidociane 4% Procedures Performed: N/A Debridement N/A Treatment Notes Wound #6 (Right, Proximal Lower Leg) 1. Cleansed with: Cleanse wound with antibacterial soap and water 3. Peri-wound Care: Moisturizing lotion 4.  Dressing Applied: Other dressing (specify in notes) 5. Secondary Dressing Applied Dry Gauze Kerlix/Conform 7. Secured with Tape Notes ENDOFORM Wound #7 (Right, Dorsal Metatarsal head first) 1. Cleansed with: Cleanse wound with antibacterial soap and water 3. Peri-wound Care: Moisturizing lotion 4. Dressing Applied: Other dressing (specify in notes) 5. Secondary Dressing Applied Dry Gauze Kerlix/Conform 7. Secured with Tape Notes ENDOFORM Electronic Signature(s) Signed: 01/15/2017 5:26:18 PM By: Linton Ham MD Entered By: Linton Ham on 01/15/2017 09:45:05 Weikel, [name] ([id number]) -------------------------------------------------------------------------------- Multi-Disciplinary Care Plan Details Patient Name: [name], [name] Date of Service: 01/15/2017 9:15 AM Medical Record Number: [id number] Patient Account Number: [id number] Date of Birth/Sex: [birth year] (76 y.o. Female) Treating RN: Afful, RN, BSN, Velva Harman Primary Care Carneshia Raker: Beverlyn Roux Other Clinician: Referring Shaydon Lease: Beverlyn Roux Treating Keyanah Kozicki/Extender: Tito Dine in Treatment: 10 Active Inactive ` Abuse / Safety / Falls / Self Care Management Nursing Diagnoses: Impaired physical mobility Potential for falls Goals: Patient will remain injury free Date Initiated: 11/06/2016 Target Resolution Date: 12/30/2016 Goal Status: Active Patient/caregiver will verbalize understanding of skin care regimen Date Initiated: 11/06/2016 Target Resolution Date: 12/30/2016 Goal Status: Active Interventions: Assess fall risk on admission  and as needed Treatment Activities: Patient referred to home care : 11/06/2016 Notes: ` Nutrition Nursing Diagnoses: Potential for alteratiion in Nutrition/Potential for imbalanced nutrition Goals: Patient/caregiver verbalizes understanding of need to maintain therapeutic glucose control per primary care physician Date Initiated: 11/06/2016 Target Resolution  Date: 12/30/2016 Goal Status: Active Interventions: Provide education on elevated blood sugars and impact on wound healing [name], [name] ([id number]) Notes: ` Orientation to the Wound Care Program Nursing Diagnoses: Knowledge deficit related to the wound healing center program Goals: Patient/caregiver will verbalize understanding of the [facility] Date Initiated: 11/06/2016 Target Resolution Date: 12/30/2016 Goal Status: Active Interventions: Provide education on orientation to the wound center Notes: ` Venous Leg Ulcer Nursing Diagnoses: Actual venous Insuffiency (use after diagnosis is confirmed) Knowledge deficit related to disease process and management Goals: Non-invasive venous studies are completed as ordered Date Initiated: 11/06/2016 Target Resolution Date: 12/30/2016 Goal Status: Active Patient/caregiver will verbalize understanding of disease process and disease management Date Initiated: 11/06/2016 Target Resolution Date: 12/30/2016 Goal Status: Active Interventions: Assess peripheral edema status every visit. Notes: ` Wound/Skin Impairment Nursing Diagnoses: Impaired tissue integrity Knowledge deficit related to smoking impact on wound healing Knowledge deficit related to ulceration/compromised skin integrity Goals: [name], [name] ([id number]) Ulcer/skin breakdown will heal within 14 weeks Date Initiated: 11/06/2016 Target Resolution Date: 02/05/2017 Goal Status: Active Interventions: Assess ulceration(s) every visit Treatment Activities: Skin care regimen initiated : 11/06/2016 Notes: Electronic Signature(s) Signed: 01/15/2017 5:05:29 PM By: Regan Lemming BSN, RN Entered By: Regan Lemming on 01/15/2017 09:26:45 [name], [name] ([id number]) -------------------------------------------------------------------------------- Pain Assessment Details Patient Name: Sherrow, [place] Date of Service: 01/15/2017 9:15 AM Medical Record Number:  [id number] Patient Account Number: [id number] Date of Birth/Sex: 04-Nov-1940 (76 y.o. Female) Treating RN: Afful, RN, BSN, Velva Harman Primary Care Kellee Sittner: Beverlyn Roux Other Clinician: Referring Andras Grunewald: Beverlyn Roux Treating Raekwan Spelman/Extender: Tito Dine in Treatment: 10 Active Problems Location of Pain Severity and Description of Pain Patient Has Paino No Site Locations With Dressing Change: No Pain Management and Medication Current Pain Management: Electronic Signature(s) Signed: 01/15/2017 5:05:29 PM By: Regan Lemming BSN, RN Entered By: Regan Lemming on 01/15/2017 09:06:16 [name], [name] ([id number]) -------------------------------------------------------------------------------- Patient/Caregiver Education Details Patient Name: Causey, Hensley V. Date of Service: 01/15/2017 9:15 AM Medical Record Patient Account Number: [id number] Number: Treating RN: Baruch Gouty, RN, BSN, Velva Harman 10/24/1940 [phone] y.o. Other Clinician: Date of Birth/Gender: Female) Treating ROBSON, [place] Primary Care Physician: Beverlyn Roux Physician/Extender: G Referring Physician: Dorthula Nettles in Treatment: 10 Education Assessment Education Provided To: Patient and Caregiver hussband Education Topics Provided Elevated Blood Sugar/ Impact on Healing: Methods: Explain/Verbal Responses: State content correctly Welcome To [facility]: Methods: Explain/Verbal Responses: State content correctly Wound Debridement: Methods: Explain/Verbal Responses: State content correctly Wound/Skin Impairment: Methods: Explain/Verbal Responses: State content correctly Electronic Signature(s) Signed: 01/15/2017 5:05:29 PM By: Regan Lemming BSN, RN Entered By: Regan Lemming on 01/15/2017 09:43:10 Nicodemus, Creasie V. ([id number]) -------------------------------------------------------------------------------- Wound Assessment Details Patient Name: Boesel, Claudell V. Date of Service: 01/15/2017 9:15 AM Medical  Record Number: [id number] Patient Account Number: [id number] Date of Birth/Sex: 17-Jul-1941 (76 y.o. Female) Treating RN: Afful, RN, BSN, Velva Harman Primary Care Irvan Tiedt: Beverlyn Roux Other Clinician: Referring Mariene Dickerman: Beverlyn Roux Treating Tayveon Lombardo/Extender: Tito Dine in Treatment: 10 Wound Status Wound Number: 6 Primary Diabetic Wound/Ulcer of the Lower Etiology: Extremity Wound Location: Right Lower Leg - Proximal Wound Open Wounding Event: Gradually Appeared Status: Date Acquired: 09/09/2016 Comorbid Cataracts, Chronic sinus Weeks Of Treatment: 10 History: problems/congestion,  Congestive Heart Clustered Wound: No Failure, Hypertension, Type II Diabetes Photos Photo Uploaded By: Regan Lemming on 01/15/2017 13:03:37 Wound Measurements Length: (cm) 0.3 Width: (cm) 0.2 Depth: (cm) 0.1 Area: (cm) 0.047 Volume: (cm) 0.005 % Reduction in Area: 85% % Reduction in Volume: 83.9% Epithelialization: Large (67-100%) Tunneling: No Undermining: No Wound Description Classification: Grade 2 Foul Odor Aft Wound Margin: Flat and Intact Slough/Fibrin Exudate Amount: Medium Exudate Type: Serosanguineous Aldama, Floris V. ([id number]) er Cleansing: No o No Exudate Color: red, brown Wound Bed Granulation Amount: Large (67-100%) Exposed Structure Granulation Quality: Red, Friable Fascia Exposed: No Necrotic Amount: None Present (0%) Fat Layer (Subcutaneous Tissue) Exposed: Yes Tendon Exposed: No Muscle Exposed: No Joint Exposed: No Bone Exposed: No Periwound Skin Texture Texture Color No Abnormalities Noted: No No Abnormalities Noted: No Callus: No Atrophie Blanche: No Crepitus: No Cyanosis: No Excoriation: No Ecchymosis: No Induration: No Erythema: No Rash: No Hemosiderin Staining: Yes Scarring: No Mottled: Yes Pallor: No Moisture Rubor: No No Abnormalities Noted: No Dry / Scaly: No Temperature / Pain Maceration: No Temperature: No Abnormality Wound  Preparation Ulcer Cleansing: Rinsed/Irrigated with Saline Topical Anesthetic Applied: None Treatment Notes Wound #6 (Right, Proximal Lower Leg) 1. Cleansed with: Cleanse wound with antibacterial soap and water 3. Peri-wound Care: Moisturizing lotion 4. Dressing Applied: Other dressing (specify in notes) 5. Secondary Dressing Applied Dry Gauze Kerlix/Conform 7. Secured with Tape Notes ENDOFORM Electronic Signature(s) Signed: 01/15/2017 5:05:29 PM By: Regan Lemming BSN, RN Marcon, Leyan V. ([id number]) Entered By: Regan Lemming on 01/15/2017 09:26:04 Taddei, Rejina [name] ([id number]) -------------------------------------------------------------------------------- Wound Assessment Details Patient Name: Kozak, Dawanna V. Date of Service: 01/15/2017 9:15 AM Medical Record Number: [id number] Patient Account Number: [id number] Date of Birth/Sex: 14-Mar-1941 (76 y.o. Female) Treating RN: Afful, RN, BSN, Administrator, sports Primary Care Yanni Ruberg: Beverlyn Roux Other Clinician: Referring Cayce Paschal: Beverlyn Roux Treating Ramiel Forti/Extender: Tito Dine in Treatment: 10 Wound Status Wound Number: 7 Primary Diabetic Wound/Ulcer of the Lower Etiology: Extremity Wound Location: Right Metatarsal head first - Dorsal Wound Open Status: Wounding Event: Gradually Appeared Comorbid Cataracts, Chronic sinus Date Acquired: 08/06/2013 History: problems/congestion, Congestive Heart Weeks Of Treatment: 10 Failure, Hypertension, Type II Diabetes Clustered Wound: No Photos Photo Uploaded By: Regan Lemming on 01/15/2017 13:03:38 Wound Measurements Length: (cm) 2.6 Width: (cm) 1 Depth: (cm) 0.5 Area: (cm) 2.042 Volume: (cm) 1.021 % Reduction in Area: -73.3% % Reduction in Volume: -189.2% Epithelialization: Small (1-33%) Tunneling: No Undermining: No Wound Description Classification: Grade 2 Wound Margin: Flat and Intact Exudate Amount: Large Exudate Type: Serous Pindell, Mckaylah V. ([id number]) Foul Odor After  Cleansing: No Slough/Fibrino Yes Exudate Color: amber Wound Bed Granulation Amount: Small (1-33%) Exposed Structure Granulation Quality: Pink, Friable Fascia Exposed: No Necrotic Amount: Medium (34-66%) Fat Layer (Subcutaneous Tissue) Exposed: Yes Necrotic Quality: Adherent Slough Tendon Exposed: No Muscle Exposed: No Joint Exposed: No Bone Exposed: Yes Periwound Skin Texture Texture Color No Abnormalities Noted: No No Abnormalities Noted: No Induration: Yes Ecchymosis: Yes Scarring: Yes Hemosiderin Staining: Yes Mottled: Yes Moisture No Abnormalities Noted: No Temperature / Pain Maceration: No Temperature: No Abnormality Tenderness on Palpation: Yes Wound Preparation Ulcer Cleansing: Rinsed/Irrigated with Saline Topical Anesthetic Applied: Other: lidociane 4%, Treatment Notes Wound #7 (Right, Dorsal Metatarsal head first) 1. Cleansed with: Cleanse wound with antibacterial soap and water 3. Peri-wound Care: Moisturizing lotion 4. Dressing Applied: Other dressing (specify in notes) 5. Secondary Dressing Applied Dry Gauze Kerlix/Conform 7. Secured with Tape Notes ENDOFORM Electronic Signature(s) Signed: 01/15/2017 5:05:29 PM By: Regan Lemming BSN, RN Entered By:  Regan Lemming on 01/15/2017 09:26:33 Frith, Barrett V. ([id number]) -------------------------------------------------------------------------------- Vitals Details Patient Name: Barkow, Ninel V. Date of Service: 01/15/2017 9:15 AM Medical Record Number: [id number] Patient Account Number: [id number] Date of Birth/Sex: [birth year] (76 y.o. Female) Treating RN: Afful, RN, BSN, Velva Harman Primary Care Cumi Sanagustin: Beverlyn Roux Other Clinician: Referring Chessica Audia: Beverlyn Roux Treating Dominick Morella/Extender: Tito Dine in Treatment: 10 Vital Signs Time Taken: 09:07 Temperature (F): 98.0 Height (in): 64 Pulse (bpm): 74 Weight (lbs): 200 Respiratory Rate (breaths/min): 16 Body Mass Index (BMI): 34.3 Blood  Pressure (mmHg): 135/54 Reference Range: 80 - 120 mg / dl Electronic Signature(s) Signed: 01/15/2017 5:05:29 PM By: Regan Lemming BSN, RN Entered By: Regan Lemming on 01/15/2017 09:10:11

## 2017-01-17 ENCOUNTER — Other Ambulatory Visit: Payer: Self-pay | Admitting: Internal Medicine

## 2017-01-17 DIAGNOSIS — L97519 Non-pressure chronic ulcer of other part of right foot with unspecified severity: Secondary | ICD-10-CM

## 2017-01-22 ENCOUNTER — Other Ambulatory Visit: Payer: Self-pay | Admitting: Internal Medicine

## 2017-01-22 ENCOUNTER — Ambulatory Visit
Admission: RE | Admit: 2017-01-22 | Discharge: 2017-01-22 | Disposition: A | Discharge status: Home / Self Care | Payer: Medicare HMO | Source: Ambulatory Visit | Attending: Internal Medicine | Admitting: Internal Medicine

## 2017-01-22 ENCOUNTER — Encounter: Payer: Medicare HMO | Admitting: Internal Medicine

## 2017-01-22 DIAGNOSIS — E11622 Type 2 diabetes mellitus with other skin ulcer: Secondary | ICD-10-CM | POA: Diagnosis not present

## 2017-01-22 DIAGNOSIS — Z981 Arthrodesis status: Secondary | ICD-10-CM | POA: Insufficient documentation

## 2017-01-22 DIAGNOSIS — L97519 Non-pressure chronic ulcer of other part of right foot with unspecified severity: Secondary | ICD-10-CM

## 2017-01-23 ENCOUNTER — Ambulatory Visit: Payer: Medicare HMO

## 2017-01-24 NOTE — Progress Notes (Signed)
[name] ([id number]) Visit Report for 01/22/2017 Debridement Details Patient Name: [name], [name] Date of Service: 01/22/2017 10:15 AM Medical Record Patient Account Number: [id number] Number: Treating RN: [name] 29-Jan-1941 (76 y.o. Other Clinician: Date of Birth/Sex: Female) Treating [name], [place] Primary Care Provider: Beverlyn [name] Provider/Extender: G Referring Provider: Dorthula [name] in Treatment: 11 Debridement Performed for Wound #7 Right,Dorsal Metatarsal head first Assessment: Performed By: Physician [name] Dillon, MD Debridement: Debridement Severity of Tissue Pre Necrosis of bone Debridement: Pre-procedure Verification/Time Out Yes - 10:28 Taken: Start Time: 10:28 Pain Control: Lidocaine 4% Topical Solution Level: Skin/Subcutaneous Tissue/Muscle Total Area Debrided (L x 2.5 (cm) x 0.9 (cm) = 2.25 (cm) W): Tissue and other Viable, Non-Viable, Bone, Eschar, Fibrin/Slough, Subcutaneous material debrided: Instrument: Curette Bleeding: Minimum Hemostasis Achieved: Pressure End Time: 10:30 Procedural Pain: 0 Post Procedural Pain: 0 Response to Treatment: Procedure was tolerated well Post Debridement Measurements of Total Wound Length: (cm) 2.5 Width: (cm) 0.9 Depth: (cm) 0.9 Volume: (cm) 1.59 Character of Wound/Ulcer Post Improved Debridement: Severity of Tissue Post Debridement: Necrosis of muscle Post Procedure Diagnosis Same as Pre-procedure [name] ([id number]) Electronic Signature(s) Signed: 01/22/2017 5:43:18 PM By: [name] Ham MD Signed: 01/22/2017 5:44:09 PM By: [name] Entered By: [name] on 01/22/2017 10:36:06 [name], [name] ([id number]) -------------------------------------------------------------------------------- HPI Details Patient Name: [name], [name] Date of Service: 01/22/2017 10:15 AM Medical Record Patient Account Number: [id number] Number: Treating RN:  [name] 1940-09-05 (76 y.o. Other Clinician: Date of Birth/Sex: Female) Treating [name] Primary Care Provider: Beverlyn [name] Provider/Extender: G Referring Provider: Dorthula [name] in Treatment: 11 History of Present Illness HPI Description: 08/13/16: This is a 76 year old woman who came predominantly for review of 3 cm in diameter circular wound to the left anterior lateral leg. She was in the ER on 08/01/16 I reviewed their notes. There was apparently pus coming out of the wound at that time and the patient arrived requesting debridement which they don't do in the emergency room. Nevertheless I can't see that they did any x-rays. There were no cultures done. She is a type II diabetic and I a note after the patient was in the clinic that she had a bypass graft from the popliteal to the tibial on the right on 02/28/16. She also had a right greater saphenous vein harvest on the same date for arterial bypass. She is going to have vascular studies including ABIs T ABIs on the right on 08/28/16. The patient's surgery was on 02/28/16 by [name] she had a right below the knee popliteal artery to peroneal artery bypass with reverse greater saphenous vein and an endarterectomy of the mid segment peroneal artery. Postoperatively she had a strong mild monophasic peroneal signal with a pink foot. It would appear that the patient is had some nonhealing in the surgical saphenous vein harvest site on the left leg. Surprisingly [facility] I cannot see much information about this at all. [name] notes from 05/29/16 show that the patient's wounds "are not healed" the right first metatarsal wound healed but then opened back up. 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 She has had surgery on the metatarsal phalangeal in 2015  I Dr. Doran Durand secondary to a right metatarsal phalangeal joint fracture. She is apparently had discoloration around this area since then. 08/28/16; patient arrives with her wounds in much the same condition. The linear vein harvest site and the circular wound below it which I think was a blister. She also has to probing holes in her right great toe and a necrotic eschar on the right second toe. Because of these being arterial wounds I reduced her compression from 3-2 layers this seems to of done satisfactorily she has not had any problems. I cannot see that she is actually had an x-ray ====== 11/06/16 the patient comes in for evaluation of her right lower extremity ulcers. She was here in January 2018 for 2 visits subsequently ended up in the hospital with pneumonia and then to rehabilitation. She has now been discharged from rehabilitation and is home. She has multiple ulcerations to her right lower extremity including the foot and toes. She does have home health in place and they have been placing alginate to the ulcers. She is followed by [name] of vascular medicine. 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 She recently saw him on 3/23. In office ABIs were: [name] ([id number]) Right 0.48 with monophasic flow to the DP, PT, peroneal Left 0.63 with monophasic flow to the DP and PT Her arterial studies indicated a patent right below knee popliteal to peroneal bypass She had an MRI in February 2008 that was negative frosty myelitis but this showed general soft tissue edema in the right foot and lower extremity concerning for cellulitis She is a diabetic, managed with insulin. Her hemoglobin A1c in December 2017 was 8.4 which is a trend up from previous levels. She had blood work in February 2018  which revealed an albumin of 2.6 this appears to be relatively acute as an albumin in November 2017 was 3.7 11/13/16; this is a patient I have not seen since February who is readmitted to our clinic last week. She is a type II diabetic on insulin with known severe PAD status post revascularization in the left leg by [name] I have reviewed Dr. Lianne Moris notes from March/23/18. Doppler ABI on that date showed an ABI on the right of 0.48 and on the left of 0.63. Dorsalis pedis waveforms were monophasic bilaterally. There was no waveforms detected at the posterior tibial on the right, monophasic on the left. Dr. Lianne Moris comments were that this patient would have follow-up vascular studies in 3 months including ABIs and right lower extremity arterial duplex. She had an MRI in February that was negative for osteomyelitis but showed generalized edema in the foot. Last albumin I see was in January at 3.4 we have been using Santyl to the 4 wounds on the right leg the patient is noted today to have widespread edema well up towards her groin this is pitting 2-3+. 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 Normal ejection fraction. 11/20/16; patient has a follow-up appointment with [name] on April 23. She is still complaining of a lot of pain in the right foot and right leg. It is not clear to me that this is at all positional however I think it is clear claudication with minimal activity perhaps at rest. At our suggestion she is return to her primary physician's office tomorrow with regards to her pitting lower extremity bilateral edema that  I reviewed in detail last week 11/27/16; the follow-up with [name] was actually on May 23 on April 23 as I stated in my note last week. N/A case all of her wounds seems somewhat smaller. The 2 on the right leg are definitely smaller. The areas on the dorsal right first toe, right third toe and the lateral part  of the right fifth metatarsal head all looks smaller but have tightly adherent surfaces. We have been using Santyl 12/04/16; follow-up with [name] on May 23. 2 small open wounds on the right leg continue to get smaller. The area on the right third total lateral aspect of the right fifth toe also look better. The remaining area on the dorsal first toe still has some depth to it. We have been using Santyl to the toes and collagen on the right 12/11/16; according to patient's husband the follow-up with [name] is not until July. All of the wounds on the right leg are measuring smaller. We have been using some combination of Prisma and Santyl although I think we can go to straight Prisma today. There may have been some confusion with home health about the primary dressing orders here. 12/25/16; the patient has had some healing this week. The area on her right lateral fifth metatarsal head, right third toe are both healed lower right leg is healed. In the vein harvest site superiorly she has one superficial open area. On the dorsal aspect of her right great toe/MTP joint the wound is now divided into 2 however the proximal area is deep and there is palpable bone 01/01/17; still an open area in the middle of her original right surgical scar. The area on the right third toe and right lateral fifth metatarsal head remained closed. Problematic area on the dorsal aspect of the toe. Previous surgery in this area line 01/08/17 small open area on the original scar on her upper anterior leg although this is closing. X-ray I did of the right first toe did not show underlying bony abnormality. Still this area on the dorsal first toe probes to bone. We have been using Prisma 01/15/17; small open area in the original scar in the upper anterior leg is almost fully closed. She has 2 open areas over the dorsal aspect of the right first toe that probes to bone. Used and a form starting last week. Vigorous bone scraping  that I did last week showed few methicillin sensitive staph aureus. She is allergic to Holst, Temesha V. ([id number]) penicillin and sulfa drugs. I'm going to give her 2 weeks of doxycycline. She will need an MRI with contrast. 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 She continues on doxycycline for a bone scraping that showed methicillin sensitive staph aureus [allergic to penicillin and sulfa]. We have been using Endo form to the wound Electronic Signature(s) Signed: 01/22/2017 5:43:18 PM By: [name] Ham MD Entered By: [name] on 01/22/2017 10:37:13 Zieger, [name] ([id number]) -------------------------------------------------------------------------------- Physical Exam Details Patient Name: [name], [name] Date of Service: 01/22/2017 10:15 AM Medical Record Patient Account Number: [id number] Number: Treating RN: [name] 1940/11/16 (76 y.o. Other Clinician: Date of Birth/Sex: Female) Treating Samya Siciliano Primary Care Provider: Beverlyn [name] Provider/Extender: G Referring Provider: Dorthula [name] in Treatment: 11 Constitutional Sitting or standing Blood Pressure is within target range for patient.. Pulse regular and within target range for patient.[name] Respirations regular, non-labored  and within target range.. Temperature is normal and within the target range for the patient.[name] appears in no distress. Eyes Conjunctivae clear. No discharge. Cardiovascular Peripheral pulses are reduced but palpable. Notes Wound exam; the only remaining wound here is on the dorsal first toe. There are 2 open areas here in close physician to each other on the dorsal aspect. 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 The more proximal one has exposed bone and has had this for several weeks. Plain x-ray did not show osteo-her MRI's this afternoon.  I have her on doxycycline started last week for a vigorous bone scraping that showed MSSA. It is possible she require IV antibiotics. In general the wound bed looks healthier with healthy granulation. There is still exposed bone which I debridement it out of the surface of this wound. Electronic Signature(s) Signed: 01/22/2017 5:43:18 PM By: [name] Ham MD Entered By: [name] on 01/22/2017 10:41:00 Mcelroy, Myeesha [name] ([id number]) -------------------------------------------------------------------------------- Physician Orders Details Patient Name: Mckain, Allycia V. Date of Service: 01/22/2017 10:15 AM Medical Record Patient Account Number: [id number] Number: Treating RN: [name] 1941-05-12 (76 y.o. Other Clinician: Date of Birth/Sex: Female) Treating Mccall Will Primary Care Provider: Beverlyn [name] Provider/Extender: G Referring Provider: Dorthula [name] in Treatment: 17 Verbal / Phone Orders: No Diagnosis Coding Wound Cleansing Wound #7 Right,Dorsal Metatarsal head first o Clean wound with Normal Saline. o Cleanse wound with mild soap and water Anesthetic Wound #7 Right,Dorsal Metatarsal head first o Topical Lidocaine 4% cream applied to wound bed prior to debridement Primary Wound Dressing Wound #7 Right,Dorsal Metatarsal head first o Other: - ENDOFORM Secondary Dressing Wound #7 Right,Dorsal Metatarsal head first o Dry Gauze o Conform/Kerlix Dressing Change Frequency Wound #7 Right,Dorsal Metatarsal head first o Change dressing every other day. Follow-up Appointments Wound #7 Right,Dorsal Metatarsal head first o Return Appointment in 1 week. Edema Control Wound #7 Right,Dorsal Metatarsal head first o Elevate legs to the level of the heart and pump ankles as often as possible Home Health Wound #7 Right,Dorsal Metatarsal head first Panek, Amarachi V. ([id number]) o [place] Visits - [facility] twice weekly, [facility]-Wednesday, teach family member to change on off day. o Home Health Nurse may visit PRN to address patientos wound care needs. o FACE TO FACE ENCOUNTER: MEDICARE and MEDICAID PATIENTS: I certify that this patient is under my care and that I had a face-to-face encounter that meets the physician face-to-face encounter requirements with this patient on this date. The encounter with the patient was in whole or in part for the following MEDICAL CONDITION: (primary reason for [place]) MEDICAL NECESSITY: I certify, that based on my findings, NURSING services are a medically necessary home health service. HOME BOUND STATUS: I certify that my clinical findings support that this patient is homebound (i.e., Due to illness or injury, pt requires aid of supportive devices such as crutches, cane, wheelchairs, walkers, the use of special transportation or the assistance of another person to leave their place of residence. There is a normal inability to leave the home and doing so requires considerable and taxing effort. Other absences are for medical reasons / religious services and are infrequent or of short duration when for other reasons). o If current dressing causes regression in wound condition, may D/C ordered dressing product/s and apply Normal Saline Moist Dressing daily until next [place] / Other MD appointment. [place] of regression in wound condition at [phone].   o Please direct any NON-WOUND related issues/requests for orders to patient's Primary Care Physician Medications-please add to medication list. Wound #7 Right,Dorsal Metatarsal head first o P.O. Antibiotics Electronic Signature(s) Signed: 01/22/2017 5:43:18 PM By: [name] Ham MD Signed: 01/22/2017 5:44:09 PM By: [name] Entered By: [name] on 01/22/2017 10:31:23 [name], [name]  ([id number]) -------------------------------------------------------------------------------- Problem List Details Patient Name: [name], [name] Date of Service: 01/22/2017 10:15 AM Medical Record Patient Account Number: [id number] Number: Treating RN: [name] [birth year] (76 y.o. 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 ([id number]) -------------------------------------------------------------------------------- Progress Note Details Patient Name: [name], [name] Date of Service: 01/22/2017 10:15 AM Medical Record Patient Account Number: [id number] Number: Treating RN: [name] 1941-06-07 (76 y.o. Other Clinician: Date of Birth/Sex: Female) Treating Iylah Dworkin Primary Care Provider: Beverlyn [name] Provider/Extender: G Referring Provider: Dorthula [name] in Treatment: 11 Subjective History of Present Illness  (HPI) 08/13/16: This is a 76 year old woman who came predominantly for review of 3 cm in diameter circular wound to the left anterior lateral leg. She was in the ER on 08/01/16 I reviewed their notes. There was apparently pus coming out of the wound at that time and the patient arrived requesting debridement which they don't do in the emergency room. Nevertheless I can't see that they did any x-rays. There were no cultures done. She is a type II diabetic and I a note after the patient was in the clinic that she had a bypass graft from the popliteal to the tibial on the right on 02/28/16. She also had a right greater saphenous vein harvest on the same date for arterial bypass. She is going to have vascular studies including ABIs T ABIs on the right on 08/28/16. The patient's surgery was on 02/28/16 by [name] she had a right below the knee popliteal artery to peroneal artery bypass with reverse greater saphenous vein and an endarterectomy of the mid segment peroneal artery. Postoperatively she had a strong mild monophasic peroneal signal with a pink foot. It would appear that the patient is had some nonhealing in the surgical saphenous vein harvest site on the left leg. Surprisingly looking [facility] I cannot see much information about this at all. [name] notes from 05/29/16 show that the patient's wounds "are not healed" the right first metatarsal wound healed but then opened back up. 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 At that point the patient had a wound VAC to her right calf with regards to the wounds on her dorsal right toe would appear that these  are felt to be arterial wounds. She has had surgery on the metatarsal phalangeal in 2015 I Dr. Doran Durand secondary to a right metatarsal phalangeal joint fracture. She is apparently had discoloration around this area since  then. 08/28/16; patient arrives with her wounds in much the same condition. The linear vein harvest site and the circular wound below it which I think was a blister. She also has to probing holes in her right great toe and a necrotic eschar on the right second toe. Because of these being arterial wounds I reduced her compression from 3-2 layers this seems to of done satisfactorily she has not had any problems. I cannot see that she is actually had an x-ray ====== 11/06/16 the patient comes in for evaluation of her right lower extremity ulcers. She was here in January 2018 for 2 visits subsequently ended up in the hospital with pneumonia and then to rehabilitation. She has now been discharged from rehabilitation and is home. She has multiple ulcerations to her right lower extremity including the foot and toes. She does have home health in place and they have been placing alginate to the ulcers. She is followed by [name] of vascular medicine. 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 She recently saw him on 3/23. In office ABIs were: [place], RICARDO KAYES. ([id number]) Right 0.48 with monophasic flow to the DP, PT, peroneal Left 0.63 with monophasic flow to the DP and PT Her arterial studies indicated a patent right below knee popliteal to peroneal bypass She had an MRI in February 2008 that was negative frosty myelitis but this showed general soft tissue edema in the right foot and lower extremity concerning for cellulitis She is a diabetic, managed with insulin. Her hemoglobin A1c in December 2017 was 8.4 which is a trend up from previous levels. She had blood work in February 2018 which revealed an albumin of 2.6 this appears to be relatively acute as an albumin in November 2017 was 3.7 11/13/16; this is a patient I have not seen since February who is readmitted to our clinic last week. She is a type II diabetic on insulin with  known severe PAD status post revascularization in the left leg by [name] I have reviewed Dr. Lianne Moris notes from March/23/18. Doppler ABI on that date showed an ABI on the right of 0.48 and on the left of 0.63. Dorsalis pedis waveforms were monophasic bilaterally. There was no waveforms detected at the posterior tibial on the right, monophasic on the left. Dr. Lianne Moris comments were that this patient would have follow-up vascular studies in 3 months including ABIs and right lower extremity arterial duplex. She had an MRI in February that was negative for osteomyelitis but showed generalized edema in the foot. Last albumin I see was in January at 3.4 we have been using Santyl to the 4 wounds on the right leg the patient is noted today to have widespread edema well up towards her groin this is pitting 2-3+. 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 Normal ejection fraction. 11/20/16; patient has a follow-up appointment with [name] on April 23. She is still complaining of a lot of pain in the right foot and right leg. It is not clear to me that this is at all positional however I think it is clear claudication with minimal activity perhaps at rest. At our suggestion she is  return to her primary physician's office tomorrow with regards to her pitting lower extremity bilateral edema that I reviewed in detail last week 11/27/16; the follow-up with [name] was actually on May 23 on April 23 as I stated in my note last week. N/A case all of her wounds seems somewhat smaller. The 2 on the right leg are definitely smaller. The areas on the dorsal right first toe, right third toe and the lateral part of the right fifth metatarsal head all looks smaller but have tightly adherent surfaces. We have been using Santyl 12/04/16; follow-up with [name] on May 23. 2 small open wounds on the right leg continue to get smaller. The area on the right third  total lateral aspect of the right fifth toe also look better. The remaining area on the dorsal first toe still has some depth to it. We have been using Santyl to the toes and collagen on the right 12/11/16; according to patient's husband the follow-up with [name] is not until July. All of the wounds on the right leg are measuring smaller. We have been using some combination of Prisma and Santyl although I think we can go to straight Prisma today. There may have been some confusion with home health about the primary dressing orders here. 12/25/16; the patient has had some healing this week. The area on her right lateral fifth metatarsal head, right third toe are both healed lower right leg is healed. In the vein harvest site superiorly she has one superficial open area. On the dorsal aspect of her right great toe/MTP joint the wound is now divided into 2 however the proximal area is deep and there is palpable bone 01/01/17; still an open area in the middle of her original right surgical scar. The area on the right third toe and right lateral fifth metatarsal head remained closed. Problematic area on the dorsal aspect of the toe. Previous surgery in this area line 01/08/17 small open area on the original scar on her upper anterior leg although this is closing. X-ray I did of the right first toe did not show underlying bony abnormality. Still this area on the dorsal first toe probes to bone. We have been using Prisma 01/15/17; small open area in the original scar in the upper anterior leg is almost fully closed. She has 2 open areas over the dorsal aspect of the right first toe that probes to bone. Used and a form starting last week. Vigorous bone scraping that I did last week showed few methicillin sensitive staph aureus. She is allergic to Jefferys, [place] V. ([id number]) penicillin and sulfa drugs. I'm going to give her 2 weeks of doxycycline. She will need an MRI with contrast. 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 She continues on doxycycline for a bone scraping that showed methicillin sensitive staph aureus [allergic to penicillin and sulfa]. We have been using Endo form to the wound Objective Constitutional Sitting or standing Blood Pressure is within target range for patient.. Pulse regular and within target range for patient.[name] Respirations regular, non-labored and within target range.. Temperature is normal and within the target range for the patient.[name] appears in no distress. Vitals Time Taken: 10:16 AM, Height: 64 in, Weight: 200 lbs, BMI: 34.3, Temperature: 97.9 F, Pulse: 74 bpm, Respiratory Rate: 16 breaths/min, Blood Pressure: 160/53 mmHg. Eyes Conjunctivae clear. No discharge. Cardiovascular Peripheral pulses are reduced but  palpable. General Notes: Wound exam; the only remaining wound here is on the dorsal first toe. There are 2 open areas here in close physician to each other on the dorsal aspect. 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 The more proximal one has exposed bone and has had this for several weeks. Plain x-ray did not show osteo-her MRI's this afternoon. I have her on doxycycline started last week for a vigorous bone scraping that showed MSSA. It is possible she require IV antibiotics. In general the wound bed looks healthier with healthy granulation. There is still exposed bone which I debridement it out of the surface of this wound. Integumentary (Hair, Skin) Wound #6 status is Healed - Epithelialized. Original cause of wound was Gradually Appeared. The wound is located on the Right,Proximal Lower Leg. The wound measures 0cm length x 0cm width x 0cm depth; 0cm^2 area and 0cm^3 volume. Wound #7 status is Open. Original cause of wound was Gradually Appeared. The wound is located on the Right,Dorsal Metatarsal head first. The  wound measures 2.5cm length x 0.9cm width x 0.8cm depth; 1.767cm^2 area and 1.414cm^3 volume. There is bone, muscle, and Fat Layer (Subcutaneous Tissue) Exposed exposed. There is no tunneling or undermining noted. There is a large amount of serous drainage noted. The wound margin is flat and intact. There is small (1-33%) pink, friable granulation within the wound bed. There is a medium (34-66%) amount of necrotic tissue within the wound bed including Adherent Slough. The periwound skin appearance exhibited: Induration, Scarring, Ecchymosis, Hemosiderin Staining, Mottled. The periwound skin appearance did not exhibit: Maceration. Periwound temperature was noted as [name], [name] ([id number]) No Abnormality. The periwound has tenderness on palpation. 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 There was a Skin/Subcutaneous Tissue/Muscle Debridement ([id number]) debridement with total area of 2.25 sq cm performed by [name] Dillon, MD. with the following instrument(s): Curette to remove Viable and Non-Viable tissue/material including Bone, Fibrin/Slough, Eschar, and Subcutaneous after achieving pain control using Lidocaine 4% Topical Solution. A time out was  conducted at 10:28, prior to the start of the procedure. A Minimum amount of bleeding was controlled with Pressure. The procedure was tolerated well with a pain level of 0 throughout and a pain level of 0 following the procedure. Post Debridement Measurements: 2.5cm length x 0.9cm width x 0.9cm depth; 1.59cm^3 volume. Character of Wound/Ulcer Post Debridement is improved. Severity of Tissue Post Debridement is: Necrosis of muscle. Post procedure Diagnosis Wound #7: Same as Pre-Procedure Plan Wound Cleansing: Wound #7 Right,Dorsal Metatarsal head first: Clean wound with Normal Saline. [name], [name] ([id number]) Cleanse wound with mild soap and water Anesthetic: Wound #7 Right,Dorsal Metatarsal head first: Topical Lidocaine 4% cream applied to wound bed prior to debridement Primary Wound Dressing: Wound #7 Right,Dorsal Metatarsal head first: Other: - ENDOFORM  Secondary Dressing: Wound #7 Right,Dorsal Metatarsal head first: Dry Gauze Conform/Kerlix Dressing Change Frequency: Wound #7 Right,Dorsal Metatarsal head first: Change dressing every other day. Follow-up Appointments: Wound #7 Right,Dorsal Metatarsal head first: Return Appointment in 1 week. Edema Control: Wound #7 Right,Dorsal Metatarsal head first: Elevate legs to the level of the heart and pump ankles as often as possible Home Health: Wound #7 Right,Dorsal Metatarsal head first: [facility] weekly[facility]-Wednesday, teach family member to change on off day. Home Health Nurse may visit [facility] to address patient s wound care needs. FACE TO FACE ENCOUNTER: MEDICARE and MEDICAID PATIENTS: I certify that this patient is under my care and that I had a face-to-face encounter that meets the physician face-to-face encounter requirements with this patient on this date. The encounter with the patient was in whole or in part for the following MEDICAL CONDITION: (primary reason for [place]) MEDICAL NECESSITY: I certify, that based on my findings, NURSING services are a medically necessary home health service. HOME BOUND STATUS: I certify that my clinical findings support that this patient is homebound (i.e., Due to illness or injury, pt requires aid of supportive devices such as crutches, cane, wheelchairs, walkers, the use of special transportation or the assistance of another person to leave their place of residence. There is a normal inability to leave the home and doing so requires considerable and taxing effort. Other absences are for medical reasons / religious services and are infrequent or of short duration when for other reasons). If current dressing causes regression in wound condition, may D/C ordered dressing product/s and apply Normal Saline Moist Dressing daily until next Lucas / Other MD appointment. [place] of regression in wound condition at [phone]. Please direct any NON-WOUND related issues/requests for orders to patient's Primary Care Physician Medications-please add to medication list.: Wound #7 Right,Dorsal Metatarsal head first: P.O. Antibiotics Continue doxycycline. She has enough for another week May need to renew Bouie, [name] ([id number]) MRI this afternoon May require IV antibiotics Still debridement of the surface bone In spite of the again palpable bone this wound actually looks some better healthier granulation Pending on the MRI consultation with infectious disease plus or minus surgery Electronic Signature(s) Signed: 01/22/2017 5:43:18 PM By: [name] Ham MD Entered By: [name] on 01/22/2017 10:42:32 Williamson, [name] [name] ([id number]) -------------------------------------------------------------------------------- SuperBill Details Patient Name: Buzby, Almendra V. Date of Service: 01/22/2017 Medical Record Patient Account Number: [id number] Number: Treating RN: [name] Feb 15, 1941 (76 y.o. 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 I70.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 of foot Facility Procedures CPT4: Description Modifier Quantity Code [id number] - DEB BONE 20 SQ CM/< 1 ICD-10 Description Diagnosis E11.621 Type 2 diabetes mellitus with foot ulcer 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

## 2017-01-24 NOTE — Progress Notes (Signed)
MEERAB, MASELLI ([id number]) Visit Report for 01/22/2017 Arrival Information Details Patient Name: [name], [name] Date of Service: 01/22/2017 10:15 AM Medical Record Number: [id number] Patient Account Number: [id number] Date of Birth/Sex: 04-14-1941 (76 y.o. Female) Treating RN: Montey Hora Primary Care Amoni Morales: Beverlyn Roux Other Clinician: Referring Bernice Mullin: Beverlyn Roux Treating Lexie Morini/Extender: Tito Dine in Treatment: 11 Visit Information History Since Last Visit Added or deleted any medications: No Patient Arrived: Wheel Chair Any new allergies or adverse reactions: No Arrival Time: 10:10 Had a fall or experienced change in No activities of daily living that may affect Accompanied By: spouse risk of falls: Transfer Assistance: Manual Signs or symptoms of abuse/neglect since last No Patient Identification Verified: Yes visito Secondary Verification Process Yes Hospitalized since last visit: No Completed: Has Dressing in Place as Prescribed: Yes Patient Requires Transmission-Based No Pain Present Now: Yes Precautions: Patient Has Alerts: No Electronic Signature(s) Signed: 01/22/2017 5:44:09 PM By: Montey Hora Entered By: Montey Hora on 01/22/2017 10:16:10 Posa, Jame [name] ([id number]) -------------------------------------------------------------------------------- Encounter Discharge Information Details Patient Name: [name], [name] Date of Service: 01/22/2017 10:15 AM Medical Record Number: [id number] Patient Account Number: [id number] Date of Birth/Sex: January 27, 1941 (76 y.o. Female) Treating RN: Montey Hora Primary Care Davieon Stockham: Beverlyn Roux Other Clinician: Referring Makeshia Seat: Beverlyn Roux Treating Kamaljit Hizer/Extender: Tito Dine in Treatment: 11 Encounter Discharge Information Items Discharge Pain Level: 0 Discharge Condition: Stable Ambulatory Status: Wheelchair Discharge Destination: Home Transportation: Private  Auto Accompanied By: spouse Schedule Follow-up Appointment: Yes Medication Reconciliation completed and provided to Patient/Care No Christne Platts: Provided on Clinical Summary of Care: 01/22/2017 Form Type Recipient Paper Patient [facility] Electronic Signature(s) Signed: 01/22/2017 2:06:05 PM By: Montey Hora Previous Signature: 01/22/2017 10:46:30 AM Version By: Ruthine Dose Entered By: Montey Hora on 01/22/2017 14:06:05 Schar, Barabara V. ([id number]) -------------------------------------------------------------------------------- Lower Extremity Assessment Details Patient Name: [name], [name] Date of Service: 01/22/2017 10:15 AM Medical Record Number: [id number] Patient Account Number: [id number] Date of Birth/Sex: 03-29-1941 (76 y.o. Female) Treating RN: Montey Hora Primary Care Margi Edmundson: Beverlyn Roux Other Clinician: Referring Khale Nigh: Beverlyn Roux Treating Kevante Lunt/Extender: Tito Dine in Treatment: 11 Vascular Assessment Pulses: Dorsalis Pedis Palpable: [Right:Yes] Posterior Tibial Extremity colors, hair growth, and conditions: Extremity Color: [Right:Hyperpigmented] Hair Growth on Extremity: [Right:No] Temperature of Extremity: [Right:Warm] Capillary Refill: [Right:< 3 seconds] Electronic Signature(s) Signed: 01/22/2017 5:44:09 PM By: Montey Hora Entered By: Montey Hora on 01/22/2017 10:23:12 Konecny, Herlinda V. ([id number]) -------------------------------------------------------------------------------- Multi Wound Chart Details Patient Name: [name], [name] Date of Service: 01/22/2017 10:15 AM Medical Record Number: [id number] Patient Account Number: [id number] Date of Birth/Sex: [birth year] (75 y.o. 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] [7:the Lower Extremity] Comorbid History: [6:N/A] [7:Cataracts, Chronic sinus N/A problems/congestion, Congestive Heart Failure, Hypertension, Type II Diabetes] Date Acquired: [6:09/09/2016] [7:08/06/2013] [N/A:N/A] Weeks of Treatment: [6:11] [7:11] [N/A:N/A] Wound Status: [6:Healed - Epithelialized] [7:Open] [N/A:N/A] Measurements L x W x D 0x0x0 [7:2.5x0.9x0.8] [N/A:N/A] (cm) Area (cm) : [6:0] [7:1.767] [N/A:N/A] Volume (cm) : [6:0] [7:1.414] [N/A:N/A] % Reduction in Area: [6:100.00%] [7:-50.00%] [N/A:N/A] % Reduction in Volume:  100.00% [7:-300.60%] [N/A:N/A] Classification: [6:Grade 2] [7:Grade 2] [N/A:N/A] Exudate Amount: [6:N/A] [7:Large] [N/A:N/A] Exudate Type: [6:N/A] [7:Serous] [N/A:N/A] Exudate Color: [6:N/A] [7:amber] [N/A:N/A] Wound Margin: [6:N/A] [7:Flat and Intact] [N/A:N/A] Granulation Amount: [6:N/A] [7:Small (1-33%)] [N/A:N/A] Granulation Quality: [6:N/A] [7:Pink, Friable] [N/A:N/A] Necrotic Amount: [6:N/A] [7:Medium (34-66%)] [N/A:N/A] Epithelialization: [6:N/A] [7:Small (1-33%)] [N/A:N/A] Debridement: [6:N/A] [N/A:N/A] Debridement ([id number]) Pre-procedure N/A 10:28 N/A Verification/Time Out Taken: Pain Control: N/A Lidocaine 4% Topical N/A Solution Tissue Debrided: N/A Necrotic/Eschar, N/A Fibrin/Slough, Muscle, Subcutaneous Level: N/A Skin/Subcutaneous N/A Tissue/Muscle Debridement Area (sq N/A 2.25 N/A cm): Instrument: N/A Curette N/A Bleeding: N/A Minimum N/A Hemostasis Achieved: N/A Pressure N/A Procedural Pain: N/A 0 N/A Post Procedural  Pain: N/A 0 N/A Debridement Treatment N/A Procedure was tolerated N/A Response: well Post Debridement N/A 2.5x0.9x0.9 N/A Measurements L x W x D (cm) Post Debridement N/A 1.59 N/A Volume: (cm) Periwound Skin Texture: No Abnormalities Noted Induration: Yes N/A Scarring: Yes Periwound Skin No Abnormalities Noted Maceration: No N/A Moisture: Periwound Skin Color: No Abnormalities Noted Ecchymosis: Yes N/A Hemosiderin Staining: Yes Mottled: Yes Temperature: N/A No Abnormality N/A Tenderness on No Yes N/A Palpation: Wound Preparation: N/A Ulcer Cleansing: N/A Rinsed/Irrigated with Saline Topical Anesthetic Applied: Other: lidociane 4% Procedures Performed: N/A Debridement N/A Treatment Notes Electronic Signature(s) Signed: 01/22/2017 5:43:18 PM By: Linton Ham MD Berrey, MARLEIGH KAYLOR ([id number]) Entered By: Linton Ham on 01/22/2017 10:34:50 Hachey, Lindsey [name] ([id number]) -------------------------------------------------------------------------------- Multi-Disciplinary Care Plan Details Patient Name: Smigel, Azaliyah V. Date of Service: 01/22/2017 10:15 AM Medical Record Number: [id number] Patient Account Number: [id number] Date of Birth/Sex: 1940-11-27 (75 y.o. Female) Treating RN: Montey Hora Primary Care Granger Chui: Beverlyn Roux Other Clinician: Referring Karelly Dewalt: Beverlyn Roux Treating Medina Degraffenreid/Extender: Tito Dine in Treatment: 11 Active Inactive ` Abuse / Safety / Falls / Self Care Management Nursing Diagnoses: Impaired physical mobility Potential for falls Goals: Patient will remain injury free Date Initiated: 11/06/2016 Target Resolution Date: 12/30/2016 Goal Status: Active Patient/caregiver will verbalize understanding of skin care regimen Date Initiated: 11/06/2016 Target Resolution Date: 12/30/2016 Goal Status: Active Interventions: Assess fall risk on admission and as needed Treatment Activities: Patient referred to home care :  11/06/2016 Notes: ` Nutrition Nursing Diagnoses: Potential for alteratiion in Nutrition/Potential for imbalanced nutrition Goals: Patient/caregiver verbalizes understanding of need to maintain therapeutic glucose control per primary care physician Date Initiated: 11/06/2016 Target Resolution Date: 12/30/2016 Goal Status: Active Interventions: Provide education on elevated blood sugars and impact on wound healing Furno, Estee V. ([id number]) Notes: ` Orientation to the Wound Care Program Nursing Diagnoses: Knowledge deficit related to the wound healing center program Goals: Patient/caregiver will verbalize understanding of the [place] Date Initiated: 11/06/2016 Target Resolution Date: 12/30/2016 Goal Status: Active Interventions: Provide education on orientation to the wound center Notes: ` Venous Leg Ulcer Nursing Diagnoses: Actual venous Insuffiency (use after diagnosis is confirmed) Knowledge deficit related to disease process and management Goals: Non-invasive venous studies are completed as ordered Date Initiated: 11/06/2016 Target Resolution Date: 12/30/2016 Goal Status: Active Patient/caregiver will verbalize understanding of disease process and disease management Date Initiated: 11/06/2016 Target Resolution Date: 12/30/2016 Goal Status: Active Interventions: Assess peripheral edema status every visit. Notes: ` Wound/Skin Impairment Nursing Diagnoses: Impaired tissue integrity Knowledge deficit related to smoking impact on wound healing Knowledge deficit related to ulceration/compromised skin integrity Goals: Ohman, Delyla V. ([id number]) Ulcer/skin breakdown will heal within 14 weeks Date Initiated: 11/06/2016 Target Resolution Date: 02/05/2017 Goal Status: Active Interventions: Assess ulceration(s) every visit Treatment Activities: Skin care regimen initiated : 11/06/2016 Notes:  Electronic Signature(s) Signed: 01/22/2017 5:44:09 PM By: Montey Hora Entered By: Montey Hora on 01/22/2017 10:28:06 Tomlin, Eileene [name] ([id number]) -------------------------------------------------------------------------------- Pain Assessment Details Patient Name: Mihalik, Shawnda V. Date of Service: 01/22/2017 10:15 AM Medical Record Number: [id number] Patient Account Number: [id number] Date of Birth/Sex: September 23, 1940 (76 y.o. Female) Treating RN: Montey Hora Primary Care Masson Nalepa: Beverlyn Roux Other Clinician: Referring Lisseth Brazeau: Beverlyn Roux Treating Avalee Castrellon/Extender: Tito Dine in Treatment: 11 Active Problems Location of Pain Severity and Description of Pain Patient Has Paino Yes Site Locations Pain Location: Pain in Ulcers With Dressing Change: Yes Duration of the Pain. Constant / Intermittento Constant Pain Management and Medication Current Pain Management: Notes Topical or injectable lidocaine is offered to patient for acute pain when surgical debridement is performed. If needed, Patient is instructed to use over the counter pain medication for the following 24-48 hours after debridement. Wound care MDs do not prescribed pain medications. Patient has chronic pain or uncontrolled pain. Patient has been instructed to make an appointment with their Primary Care Physician for pain management. Electronic Signature(s) Signed: 01/22/2017 5:44:09 PM By: Montey Hora Entered By: Montey Hora on 01/22/2017 10:16:22 Hilario, Ruhama [name] ([id number]) -------------------------------------------------------------------------------- Patient/Caregiver Education Details Patient Name: Yerby, Sade V. Date of Service: 01/22/2017 10:15 AM Medical Record Patient Account Number: [id number] Number: Treating RN: Montey Hora Mar 20, 1941 (76 y.o. Other Clinician: Date of Birth/Gender: Female) Treating ROBSON, Grafton Primary Care Physician: Beverlyn Roux Physician/Extender: G Referring Physician: Dorthula Nettles in Treatment:  11 Education Assessment Education Provided To: Patient and Caregiver Education Topics Provided Basic Hygiene: Handouts: Other: foot care Methods: Explain/Verbal Responses: State content correctly Electronic Signature(s) Signed: 01/22/2017 5:44:09 PM By: Montey Hora Entered By: Montey Hora on 01/22/2017 14:06:25 Waltermire, Safiyya V. ([id number]) -------------------------------------------------------------------------------- Wound Assessment Details Patient Name: Sterry, Atlee V. Date of Service: 01/22/2017 10:15 AM Medical Record Number: [id number] Patient Account Number: [id number] Date of Birth/Sex: Feb 14, 1941 (76 y.o. Female) Treating RN: Montey Hora Primary Care [name] Grissinger: Beverlyn Roux Other Clinician: Referring Brycin Kille: Beverlyn Roux Treating Eunique Balik/Extender: Tito Dine in Treatment: 11 Wound Status Wound Number: 6 Primary Diabetic Wound/Ulcer of the Lower Etiology: Extremity Wound Location: Right, Proximal Lower Leg Wound Status: Healed - Epithelialized Wounding Event: Gradually Appeared Date Acquired: 09/09/2016 Weeks Of Treatment: 11 Clustered Wound: No Photos Photo Uploaded By: Montey Hora on 01/22/2017 15:12:14 Wound Measurements Length: (cm) 0 % Reduction i Width: (cm) 0 % Reduction i Depth: (cm) 0 Area: (cm) 0 Volume: (cm) 0 n Area: 100% n Volume: 100% Wound Description Classification: Grade 2 Periwound Skin Texture Texture Color No Abnormalities Noted: No No Abnormalities Noted: No Moisture No Abnormalities Noted: No Electronic Signature(s) Signed: 01/22/2017 5:44:09 PM By: Ammie Dalton ([id number]) Entered By: Montey Hora on 01/22/2017 10:27:37 Girvan, Zamyia V. ([id number]) -------------------------------------------------------------------------------- Wound Assessment Details Patient Name: Osterloh, Kaeden V. Date of Service: 01/22/2017 10:15 AM Medical Record Number: [id number] Patient Account Number:  [id number] Date of Birth/Sex: [birth year] (76 y.o. Female) Treating RN: Montey Hora Primary Care Jubal Rademaker: Beverlyn Roux Other Clinician: Referring Joeline Freer: Beverlyn Roux Treating Kebrina Friend/Extender: Tito Dine in Treatment: 11 Wound Status Wound Number: 7 Primary Diabetic Wound/Ulcer of the Lower Etiology: Extremity Wound Location: Right Metatarsal head first - Dorsal Wound Open Status: Wounding Event: Gradually Appeared Comorbid Cataracts, Chronic sinus Date Acquired: 08/06/2013 History: problems/congestion, Congestive Heart Weeks Of Treatment: 11 Failure, Hypertension, Type II Diabetes Clustered Wound: No Photos Photo Uploaded By: Montey Hora on 01/22/2017 15:12:14 Wound Measurements Length: (cm) 2.5 Width: (cm) 0.9  Depth: (cm) 0.8 Area: (cm) 1.767 Volume: (cm) 1.414 % Reduction in Area: -50% % Reduction in Volume: -300.6% Epithelialization: Small (1-33%) Tunneling: No Undermining: No Wound Description Classification: Grade 2 Wound Margin: Flat and Intact Exudate Amount: Large Exudate Type: Serous Exudate Color: amber Foul Odor After Cleansing: No Slough/Fibrino Yes Wound Bed Granulation Amount: Small (1-33%) Exposed Structure Granulation Quality: Pink, Friable Fascia Exposed: No Necrotic Amount: Medium (34-66%) Fat Layer (Subcutaneous Tissue) Exposed: Yes Hirota, Kritika V. ([id number]) Necrotic Quality: Adherent Slough Tendon Exposed: No Muscle Exposed: Yes Necrosis of Muscle: No Joint Exposed: No Bone Exposed: Yes Periwound Skin Texture Texture Color No Abnormalities Noted: No No Abnormalities Noted: No Induration: Yes Ecchymosis: Yes Scarring: Yes Hemosiderin Staining: Yes Mottled: Yes Moisture No Abnormalities Noted: No Temperature / Pain Maceration: No Temperature: No Abnormality Tenderness on Palpation: Yes Wound Preparation Ulcer Cleansing: Rinsed/Irrigated with Saline Topical Anesthetic Applied: Other: lidociane  4%, Treatment Notes Wound #7 (Right, Dorsal Metatarsal head first) 1. Cleansed with: Clean wound with Normal Saline 2. Anesthetic Topical Lidocaine 4% cream to wound bed prior to debridement 4. Dressing Applied: Other dressing (specify in notes) 5. Secondary Dressing Applied Gauze and Kerlix/Conform 7. Secured with Tape Notes ENDOFORM Electronic Signature(s) Signed: 01/22/2017 5:44:09 PM By: Montey Hora Entered By: Montey Hora on 01/22/2017 10:30:37 Sliter, Varvara V. ([id number]) -------------------------------------------------------------------------------- Vitals Details Patient Name: Beaudoin, Jennipher V. Date of Service: 01/22/2017 10:15 AM Medical Record Number: [id number] Patient Account Number: [id number] Date of Birth/Sex: [birth year] (76 y.o. Female) Treating RN: Montey Hora Primary Care Zaylin Runco: Beverlyn Roux Other Clinician: Referring Gearld Kerstein: Beverlyn Roux Treating Jett Kulzer/Extender: Tito Dine in Treatment: 11 Vital Signs Time Taken: 10:16 Temperature (F): 97.9 Height (in): 64 Pulse (bpm): 74 Weight (lbs): 200 Respiratory Rate (breaths/min): 16 Body Mass Index (BMI): 34.3 Blood Pressure (mmHg): 160/53 Reference Range: 80 - 120 mg / dl Electronic Signature(s) Signed: 01/22/2017 5:44:09 PM By: Montey Hora Entered By: Montey Hora on 01/22/2017 10:16:49

## 2017-01-29 ENCOUNTER — Encounter: Payer: Medicare HMO | Admitting: Internal Medicine

## 2017-01-29 DIAGNOSIS — E11622 Type 2 diabetes mellitus with other skin ulcer: Secondary | ICD-10-CM | POA: Diagnosis not present

## 2017-01-30 NOTE — Progress Notes (Signed)
[name] ([id number]) Visit Report for 01/29/2017 Arrival Information Details Patient Name: [name], [name] Date of Service: 01/29/2017 9:15 AM Medical Record Number: [id number] Patient Account Number: [id number] Date of Birth/Sex: 1941/02/05 (76 y.o. Female) Treating RN: Afful, RN, BSN, Velva Harman Primary Care Waverley Krempasky: Beverlyn Roux Other Clinician: Referring Felita Bump: Beverlyn Roux Treating Cora Brierley/Extender: Tito Dine in Treatment: 12 Visit Information History Since Last Visit All ordered tests and consults were completed: No Patient Arrived: Wheel Chair Added or deleted any medications: No Arrival Time: 09:10 Any new allergies or adverse reactions: No Accompanied By: hubby Had a fall or experienced change in No activities of daily living that may affect Transfer Assistance: Manual risk of falls: Patient Identification Verified: Yes Signs or symptoms of abuse/neglect since last No Secondary Verification Process Yes visito Completed: Pain Present Now: No Patient Requires Transmission-Based No Precautions: Patient Has Alerts: No Electronic Signature(s) Signed: 01/29/2017 2:16:03 PM By: Regan Lemming BSN, RN Entered By: Regan Lemming on 01/29/2017 09:15:28 Fletes, Bennetta [name] ([id number]) -------------------------------------------------------------------------------- Encounter Discharge Information Details Patient Name: [name], [name] Date of Service: 01/29/2017 9:15 AM Medical Record Number: [id number] Patient Account Number: [id number] Date of Birth/Sex: 22-Mar-1941 (76 y.o. Female) Treating RN: Afful, RN, BSN, Velva Harman Primary Care Quanetta Truss: Beverlyn Roux Other Clinician: Referring Diamone Whistler: Beverlyn Roux Treating Julius Boniface/Extender: Tito Dine in Treatment: 12 Encounter Discharge Information Items Discharge Pain Level: 0 Discharge Condition: Stable Ambulatory Status: Wheelchair Discharge Destination: Home Transportation: Private Auto Accompanied By:  Micheline Rough Schedule Follow-up Appointment: No Medication Reconciliation completed and provided to Patient/Care No Reagen Goates: Provided on Clinical Summary of Care: 01/29/2017 Form Type Recipient Paper Patient [facility] Electronic Signature(s) Signed: 01/29/2017 2:16:03 PM By: Regan Lemming BSN, RN Previous Signature: 01/29/2017 10:03:39 AM Version By: Ruthine Dose Entered By: Regan Lemming on 01/29/2017 10:07:41 [name], [name] ([id number]) -------------------------------------------------------------------------------- Lower Extremity Assessment Details Patient Name: [name], [name] Date of Service: 01/29/2017 9:15 AM Medical Record Number: [id number] Patient Account Number: [id number] Date of Birth/Sex: February 20, 1941 (76 y.o. Female) Treating RN: Afful, RN, BSN, Velva Harman Primary Care Daishon Chui: Beverlyn Roux Other Clinician: Referring Yariah Selvey: Beverlyn Roux Treating Meilyn Heindl/Extender: Tito Dine in Treatment: 12 Vascular Assessment Pulses: Dorsalis Pedis Palpable: [Right:Yes] Posterior Tibial Extremity colors, hair growth, and conditions: Extremity Color: [Right:Mottled] Temperature of Extremity: [Right:Warm] Capillary Refill: [Right:< 3 seconds] Toe Nail Assessment Left: Right: Thick: Yes Discolored: Yes Deformed: No Improper Length and Hygiene: No Electronic Signature(s) Signed: 01/29/2017 2:16:03 PM By: Regan Lemming BSN, RN Entered By: Regan Lemming on 01/29/2017 09:16:44 [name], [name] ([id number]) -------------------------------------------------------------------------------- Multi Wound Chart Details Patient Name: [name], [name] Date of Service: 01/29/2017 9:15 AM Medical Record Number: [id number] Patient Account Number: [id number] Date of Birth/Sex: Sep 12, 1940 (76 y.o. 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] Comorbid History: [7:Cataracts, Chronic sinus N/A problems/congestion, Congestive Heart Failure, Hypertension, Type II Diabetes] Date Acquired: [7:08/06/2013] [N/A:N/A] Weeks of Treatment: [7:12] [N/A:N/A] Wound Status: [7:Open] [N/A:N/A] Measurements L x W x D 2.5x1x0.8 [N/A:N/A] (cm) Area (cm) : [7:1.963] [N/A:N/A] Volume (cm) : [7:1.571] [N/A:N/A] % Reduction in Area: [7:-66.60%] [N/A:N/A] % Reduction in Volume: -345.00% [N/A:N/A] Classification: [  7:Grade 2] [N/A:N/A] Exudate Amount: [7:Large] [N/A:N/A] Exudate Type: [7:Serous] [N/A:N/A] Exudate Color: [7:amber] [N/A:N/A] Wound Margin: [7:Flat and Intact] [N/A:N/A] Granulation Amount: [7:Small (1-33%)] [N/A:N/A] Granulation Quality: [7:Pink, Friable] [N/A:N/A] Necrotic Amount: [7:Medium (34-66%)] [N/A:N/A] Exposed Structures: [7:Fat Layer (Subcutaneous N/A Tissue) Exposed: Yes] Muscle: Yes Bone: Yes Fascia: No Tendon: No Joint: No Epithelialization: Small (1-33%) N/A N/A Debridement: Debridement (66063- N/A N/A 11047) Pre-procedure 09:36 N/A N/A Verification/Time Out Taken: Pain Control: Lidocaine 4% Topical N/A N/A Solution Tissue Debrided: Bone, Fibrin/Slough, Fat, N/A N/A Subcutaneous Level: Skin/Subcutaneous N/A N/A Tissue Debridement Area (sq 2.5 N/A N/A cm): Instrument: Curette N/A N/A Bleeding: Minimum N/A N/A Hemostasis Achieved: Pressure N/A N/A Procedural Pain: 0 N/A N/A Post Procedural Pain: 0 N/A N/A Debridement Treatment Procedure was tolerated N/A N/A Response: well Post Debridement 2.5x1x0.8 N/A N/A Measurements L  x W x D (cm) Post Debridement 1.571 N/A N/A Volume: (cm) Periwound Skin Texture: Induration: Yes N/A N/A Scarring: Yes Periwound Skin Maceration: No N/A N/A Moisture: Periwound Skin Color: Ecchymosis: Yes N/A N/A Hemosiderin Staining: Yes Mottled: Yes Temperature: No Abnormality N/A N/A Tenderness on Yes N/A N/A Palpation: Wound Preparation: Ulcer Cleansing: N/A N/A Rinsed/Irrigated with Saline, Other: soap and water Topical Anesthetic Applied: Other: lidociane 4% Procedures Performed: Debridement N/A N/A Groninger, Tieisha V. ([id number]) Treatment Notes Wound #7 (Right, Dorsal Metatarsal head first) 1. Cleansed with: Clean wound with Normal Saline 4. Dressing Applied: Iodoflex 5. Secondary Dressing Applied Dry Gauze Kerlix/Conform 7. Secured with Recruitment consultant) Signed: 01/29/2017 5:06:34 PM By: Linton Ham MD Entered By: Linton Ham on 01/29/2017 10:06:58 Guyett, [name] ([id number]) -------------------------------------------------------------------------------- Multi-Disciplinary Care Plan Details Patient Name: [name], [name] Date of Service: 01/29/2017 9:15 AM Medical Record Number: [id number] Patient Account Number: [id number] Date of Birth/Sex: [birth year] (76 y.o. Female) Treating RN: Afful, RN, BSN, Velva Harman Primary Care Aila Terra: Beverlyn Roux Other Clinician: Referring Sueko Dimichele: Beverlyn Roux Treating Jamilya Sarrazin/Extender: Tito Dine in Treatment: 12 Active Inactive ` Abuse / Safety / Falls / Self Care Management Nursing Diagnoses: Impaired physical mobility Potential for falls Goals: Patient will remain injury free Date Initiated: 11/06/2016 Target Resolution Date: 12/30/2016 Goal Status: Active Patient/caregiver will verbalize understanding of skin care regimen Date Initiated: 11/06/2016 Target Resolution Date: 12/30/2016 Goal Status: Active Interventions: Assess fall risk on admission and as needed Treatment  Activities: Patient referred to home care : 11/06/2016 Notes: ` Nutrition Nursing Diagnoses: Potential for alteratiion in Nutrition/Potential for imbalanced nutrition Goals: Patient/caregiver verbalizes understanding of need to maintain therapeutic glucose control per primary care physician Date Initiated: 11/06/2016 Target Resolution Date: 12/30/2016 Goal Status: Active Interventions: Provide education on elevated blood sugars and impact on wound healing Verner, Harriet V. ([id number]) Notes: ` Orientation to the Wound Care Program Nursing Diagnoses: Knowledge deficit related to the wound healing center program Goals: Patient/caregiver will verbalize understanding of the [place] Date Initiated: 11/06/2016 Target Resolution Date: 12/30/2016 Goal Status: Active Interventions: Provide education on orientation to the wound center Notes: ` Venous Leg Ulcer Nursing Diagnoses: Actual venous Insuffiency (use after diagnosis is confirmed) Knowledge deficit related to disease process and management Goals: Non-invasive venous studies are completed as ordered Date Initiated: 11/06/2016 Target Resolution Date: 12/30/2016 Goal Status: Active Patient/caregiver will verbalize understanding of disease process and disease management Date Initiated: 11/06/2016 Target Resolution Date: 12/30/2016 Goal Status: Active Interventions: Assess peripheral edema status every visit. Notes: ` Wound/Skin Impairment Nursing Diagnoses: Impaired tissue integrity Knowledge deficit related to smoking impact on wound healing Knowledge deficit related to ulceration/compromised skin integrity  Goals: KANYIA, HEASLIP ([id number]) Ulcer/skin breakdown will heal within 14 weeks Date Initiated: 11/06/2016 Target Resolution Date: 02/05/2017 Goal Status: Active Interventions: Assess ulceration(s) every visit Treatment Activities: Skin care regimen initiated : 11/06/2016 Notes: Electronic  Signature(s) Signed: 01/29/2017 2:16:03 PM By: Regan Lemming BSN, RN Entered By: Regan Lemming on 01/29/2017 09:37:48 [name], [name] ([id number]) -------------------------------------------------------------------------------- Pain Assessment Details Patient Name: [name], [name] Date of Service: 01/29/2017 9:15 AM Medical Record Number: [id number] Patient Account Number: [id number] Date of Birth/Sex: 07-Oct-1940 (76 y.o. Female) Treating RN: Afful, RN, BSN, Velva Harman Primary Care Torren Maffeo: Beverlyn Roux Other Clinician: Referring Margery Szostak: Beverlyn Roux Treating Felisia Balcom/Extender: Tito Dine in Treatment: 12 Active Problems Location of Pain Severity and Description of Pain Patient Has Paino No Site Locations With Dressing Change: No Pain Management and Medication Current Pain Management: Electronic Signature(s) Signed: 01/29/2017 2:16:03 PM By: Regan Lemming BSN, RN Entered By: Regan Lemming on 01/29/2017 09:15:41 [name], [name] ([id number]) -------------------------------------------------------------------------------- Patient/Caregiver Education Details Patient Name: [name], [name] Date of Service: 01/29/2017 9:15 AM Medical Record Patient Account Number: [id number] Number: Treating RN: Baruch Gouty, RN, BSN, Velva Harman Jul 17, 1941 [phone] y.o. Other Clinician: Date of Birth/Gender: Female) Treating ROBSON, Clifton Primary Care Physician: Beverlyn Roux Physician/Extender: G Referring Physician: Dorthula Nettles in Treatment: 12 Education Assessment Education Provided To: Patient and Caregiver hubby/HH Education Topics Provided Elevated Blood Sugar/ Impact on Healing: Methods: Explain/Verbal Responses: State content correctly Welcome To [facility] [place]: Methods: Explain/Verbal Responses: State content correctly Wound Debridement: Methods: Explain/Verbal Responses: State content correctly Wound/Skin Impairment: Methods: Explain/Verbal Responses: State content  correctly Electronic Signature(s) Signed: 01/29/2017 2:16:03 PM By: Regan Lemming BSN, RN Entered By: Regan Lemming on 01/29/2017 09:45:24 [name], [name] ([id number]) -------------------------------------------------------------------------------- Wound Assessment Details Patient Name: [name], [name] Date of Service: 01/29/2017 9:15 AM Medical Record Number: [id number] Patient Account Number: [id number] Date of Birth/Sex: [birth year] (75 y.o. Female) Treating RN: Afful, RN, BSN, [employer] Primary Care Brooklynne Pereida: Beverlyn Roux Other Clinician: Referring Massimiliano Rohleder: Beverlyn Roux Treating Harmonie [name]/Extender: Tito Dine in Treatment: 12 Wound Status Wound Number: 7 Primary Diabetic Wound/Ulcer of the Lower Etiology: Extremity Wound Location: Right Metatarsal head first - Dorsal Wound Open Status: Wounding Event: Gradually Appeared Comorbid Cataracts, Chronic sinus Date Acquired: 08/06/2013 History: problems/congestion, Congestive Heart Weeks Of Treatment: 12 Failure, Hypertension, Type II Diabetes Clustered Wound: No Photos Photo Uploaded By: Regan Lemming on 01/29/2017 14:20:29 Wound Measurements Length: (cm) 2.5 Width: (cm) 1 Depth: (cm) 0.8 Area: (cm) 1.963 Volume: (cm) 1.571 % Reduction in Area: -66.6% % Reduction in Volume: -345% Epithelialization: Small (1-33%) Tunneling: No Undermining: No Wound Description Classification: Grade 2 Wound Margin: Flat and Intact Exudate Amount: Large Exudate Type: Serous Exudate Color: amber Foul Odor After Cleansing: No Slough/Fibrino Yes Wound Bed Granulation Amount: Small (1-33%) Exposed Structure Granulation Quality: Pink, Friable Fascia Exposed: No Necrotic Amount: Medium (34-66%) Fat Layer (Subcutaneous Tissue) Exposed: Yes [name], [name] V. ([id number]) Necrotic Quality: Adherent Slough Tendon Exposed: No Muscle Exposed: Yes Necrosis of Muscle: No Joint Exposed: No Bone Exposed: Yes Periwound Skin Texture Texture  Color No Abnormalities Noted: No No Abnormalities Noted: No Induration: Yes Ecchymosis: Yes Scarring: Yes Hemosiderin Staining: Yes Mottled: Yes Moisture No Abnormalities Noted: No Temperature / Pain Maceration: No Temperature: No Abnormality Tenderness on Palpation: Yes Wound Preparation Ulcer Cleansing: Rinsed/Irrigated with Saline, Other: soap and water, Topical Anesthetic Applied: Other: lidociane 4%, Treatment Notes Wound #7 (Right, Dorsal Metatarsal head first) 1. Cleansed with: Clean wound with Normal Saline 4. Dressing Applied: Iodoflex  5. Secondary Dressing Applied Dry Gauze Kerlix/Conform 7. Secured with Recruitment consultant) Signed: 01/29/2017 2:16:03 PM By: Regan Lemming BSN, RN Entered By: Regan Lemming on 01/29/2017 09:25:29 Morrone, Mata [name] ([id number]) -------------------------------------------------------------------------------- Vitals Details Patient Name: Emmons, Mieshia V. Date of Service: 01/29/2017 9:15 AM Medical Record Number: [id number] Patient Account Number: [id number] Date of Birth/Sex: 1940-12-31 (76 y.o. Female) Treating RN: Afful, RN, BSN, Velva Harman Primary Care Lonnette Shrode: Beverlyn Roux Other Clinician: Referring Skyler Dusing: Beverlyn Roux Treating Darin Redmann/Extender: Tito Dine in Treatment: 12 Vital Signs Time Taken: 09:19 Temperature (F): 97.6 Height (in): 64 Pulse (bpm): 79 Weight (lbs): 200 Respiratory Rate (breaths/min): 16 Body Mass Index (BMI): 34.3 Blood Pressure (mmHg): 150/71 Reference Range: 80 - 120 mg / dl Electronic Signature(s) Signed: 01/29/2017 2:16:03 PM By: Regan Lemming BSN, RN Entered By: Regan Lemming on 01/29/2017 09:20:19

## 2017-01-30 NOTE — Progress Notes (Signed)
[name], [name] ([id number]) Visit Report for 01/29/2017 Debridement Details Patient Name: [name], [name] Date of Service: 01/29/2017 9:15 AM Medical Record Patient Account Number: [id number] Number: Treating RN: Baruch Gouty, RN, BSN, Velva Harman July 05, 1941 [phone] y.o. Other Clinician: Date of Birth/Sex: Female) Treating ROBSON, [place] Primary Care Provider: Beverlyn Roux Provider/Extender: G Referring Provider: Dorthula Nettles in Treatment: 12 Debridement Performed for Wound #7 Right,Dorsal Metatarsal head first Assessment: Performed By: Physician Ricard Dillon, MD Debridement: Debridement Severity of Tissue Pre Bone involvement without necrosis Debridement: Pre-procedure Verification/Time Out Yes - 09:36 Taken: Start Time: 09:36 Pain Control: Lidocaine 4% Topical Solution Level: Skin/Subcutaneous Tissue Total Area Debrided (L x 2.5 (cm) x 1 (cm) = 2.5 (cm) W): Tissue and other Non-Viable, Bone, Fat, Fibrin/Slough, Subcutaneous material debrided: Instrument: Curette Bleeding: Minimum Hemostasis Achieved: Pressure End Time: 09:41 Procedural Pain: 0 Post Procedural Pain: 0 Response to Treatment: Procedure was tolerated well Post Debridement Measurements of Total Wound Length: (cm) 2.5 Width: (cm) 1 Depth: (cm) 0.8 Volume: (cm) 1.571 Character of Wound/Ulcer Post Requires Further Debridement Debridement: Bone involvement without Severity of Tissue Post Debridement: necrosis Post Procedure Diagnosis [name] Kitchen ([id number]) Same as Pre-procedure Electronic Signature(s) Signed: 01/29/2017 2:16:03 PM By: Regan Lemming BSN, RN Signed: 01/29/2017 5:06:34 PM By: Linton Ham MD Entered By: Linton Ham on 01/29/2017 10:08:37 [name] ([id number]) -------------------------------------------------------------------------------- HPI Details Patient Name: [name], [name] Date of Service: 01/29/2017 9:15 AM Medical Record Patient Account Number:  [id number] Number: Treating RN: Baruch Gouty, RN, BSN, Velva Harman Dec 16, 1940 [phone] y.o. Other Clinician: Date of Birth/Sex: Female) Treating ROBSON, [name] Primary Care Provider: Beverlyn Roux Provider/Extender: G Referring Provider: Dorthula Nettles in Treatment: 12 History of Present Illness HPI Description: 08/13/16: This is a 76 year old woman who came predominantly for review of 3 cm in diameter circular wound to the left anterior lateral leg. She was in the ER on 08/01/16 I reviewed their notes. There was apparently pus coming out of the wound at that time and the patient arrived requesting debridement which they don't do in the emergency room. Nevertheless I can't see that they did any x-rays. There were no cultures done. She is a type II diabetic and I a note after the patient was in the clinic that she had a bypass graft from the popliteal to the tibial on the right on 02/28/16. She also had a right greater saphenous vein harvest on the same date for arterial bypass. She is going to have vascular studies including ABIs T ABIs on the right on 08/28/16. The patient's surgery was on 02/28/16 by Dr. Vallarie Mare she had a right below the knee popliteal artery to peroneal artery bypass with reverse greater saphenous vein and an endarterectomy of the mid segment peroneal artery. Postoperatively she had a strong mild monophasic peroneal signal with a pink foot. It would appear that the patient is had some nonhealing in the surgical saphenous vein harvest site on the left leg. Surprisingly looking [facility] I cannot see much information about this at all. Dr. Lucious Groves notes from 05/29/16 show that the patient's wounds "are not healed" the right first metatarsal wound healed but then opened back up. 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 At that point the patient had a wound VAC to her  right calf with regards to the wounds on her dorsal right toe would appear that these are felt to be arterial wounds. She  has had surgery on the metatarsal phalangeal in 2015 I Dr. Doran Durand secondary to a right metatarsal phalangeal joint fracture. She is apparently had discoloration around this area since then. 08/28/16; patient arrives with her wounds in much the same condition. The linear vein harvest site and the circular wound below it which I think was a blister. She also has to probing holes in her right great toe and a necrotic eschar on the right second toe. Because of these being arterial wounds I reduced her compression from 3-2 layers this seems to of done satisfactorily she has not had any problems. I cannot see that she is actually had an x-ray ====== 11/06/16 the patient comes in for evaluation of her right lower extremity ulcers. She was here in January 2018 for 2 visits subsequently ended up in the hospital with pneumonia and then to rehabilitation. She has now been discharged from rehabilitation and is home. She has multiple ulcerations to her right lower extremity including the foot and toes. She does have home health in place and they have been placing alginate to the ulcers. She is followed by Dr. Bridgett Larsson of vascular medicine. 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 She recently saw him on 3/23. In office ABIs were: [name] ([id number]) Right 0.48 with monophasic flow to the DP, PT, peroneal Left 0.63 with monophasic flow to the DP and PT Her arterial studies indicated a patent right below knee popliteal to peroneal bypass She had an MRI in February 2008 that was negative frosty myelitis but this showed general soft tissue edema in the right foot and lower extremity concerning for cellulitis She is a diabetic, managed with insulin. Her hemoglobin A1c in December 2017 was 8.4 which is a trend up from  previous levels. She had blood work in February 2018 which revealed an albumin of 2.6 this appears to be relatively acute as an albumin in November 2017 was 3.7 11/13/16; this is a patient I have not seen since February who is readmitted to our clinic last week. 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 Bridgett Larsson. I have reviewed Dr. Lianne Moris notes from March/23/18. Doppler ABI on that date showed an ABI on the right of 0.48 and on the left of 0.63. Dorsalis pedis waveforms were monophasic bilaterally. There was no waveforms detected at the posterior tibial on the right, monophasic on the left. Dr. Lianne Moris comments were that this patient would have follow-up vascular studies in 3 months including ABIs and right lower extremity arterial duplex. She had an MRI in February that was negative for osteomyelitis but showed generalized edema in the foot. Last albumin I see was in January at 3.4 we have been using Santyl to the 4 wounds on the right leg the patient is noted today to have widespread edema well up towards her groin this is pitting 2-3+. 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 Normal ejection fraction. 11/20/16; patient has a follow-up appointment with Dr. Bridgett Larsson on April 23. She is still complaining of a lot of pain in the right foot and right leg. It is not clear to me that this is at all positional however I think it is clear claudication with minimal activity perhaps at rest. At our suggestion she is return to her primary physician's office tomorrow with  regards to her pitting lower extremity bilateral edema that I reviewed in detail last week 11/27/16; the follow-up with Dr. Bridgett Larsson was actually on May 23 on April 23 as I stated in my note last week. N/A case all of her wounds seems somewhat smaller. The 2 on the right leg are definitely smaller. The areas on the dorsal  right first toe, right third toe and the lateral part of the right fifth metatarsal head all looks smaller but have tightly adherent surfaces. We have been using Santyl 12/04/16; follow-up with Dr. Bridgett Larsson on May 23. 2 small open wounds on the right leg continue to get smaller. The area on the right third total lateral aspect of the right fifth toe also look better. The remaining area on the dorsal first toe still has some depth to it. We have been using Santyl to the toes and collagen on the right 12/11/16; according to patient's husband the follow-up with Dr. Bridgett Larsson is not until July. All of the wounds on the right leg are measuring smaller. We have been using some combination of Prisma and Santyl although I think we can go to straight Prisma today. There may have been some confusion with home health about the primary dressing orders here. 12/25/16; the patient has had some healing this week. The area on her right lateral fifth metatarsal head, right third toe are both healed lower right leg is healed. In the vein harvest site superiorly she has one superficial open area. On the dorsal aspect of her right great toe/MTP joint the wound is now divided into 2 however the proximal area is deep and there is palpable bone 01/01/17; still an open area in the middle of her original right surgical scar. The area on the right third toe and right lateral fifth metatarsal head remained closed. Problematic area on the dorsal aspect of the toe. Previous surgery in this area line 01/08/17 small open area on the original scar on her upper anterior leg although this is closing. X-ray I did of the right first toe did not show underlying bony abnormality. Still this area on the dorsal first toe probes to bone. We have been using Prisma 01/15/17; small open area in the original scar in the upper anterior leg is almost fully closed. She has 2 open areas over the dorsal aspect of the right first toe that probes to bone. Used and  a form starting last week. Vigorous bone scraping that I did last week showed few methicillin sensitive staph aureus. She is allergic to [name], [name] ([id number]) penicillin and sulfa drugs. I'[name] going to give her 2 weeks of doxycycline. She will need an MRI with contrast. 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 She continues on doxycycline for a bone scraping that showed methicillin sensitive staph aureus [allergic to penicillin and sulfa]. We have been using Endo form to the wound. 01/29/17; surprisingly her MRI did not show osteomyelitis. She continues on doxycycline for a bone scraping that showed methicillin sensitive staph aureus with allergies to penicillin and sulfa. We have been using Endo form to the wound. Unfortunately I cannot get a surface on this visit looks like it is able to support a healing state. Proximally there is still exposed bone. There is no overt soft tissue tenderness. Her MRI did show a previous fracture was surgery to this area but no hardware Electronic Signature(s) Signed: 01/29/2017 5:06:34  PM By: Linton Ham MD Entered By: Linton Ham on 01/29/2017 10:10:08 [name], [name] ([id number]) -------------------------------------------------------------------------------- Physical Exam Details Patient Name: [name], [name] Date of Service: 01/29/2017 9:15 AM Medical Record Patient Account Number: [id number] Number: Treating RN: Baruch Gouty, RN, BSN, Velva Harman 16-Apr-1941 [phone] y.o. Other Clinician: Date of Birth/Sex: Female) Treating ROBSON, [name] Primary Care Provider: Beverlyn Roux Provider/Extender: G Referring Provider: Dorthula Nettles in Treatment: 12 Constitutional Sitting or standing Blood Pressure is within target range for patient.. Pulse regular and within target range for patient.[name] Respirations regular, non-labored and within target range.. Temperature is normal  and within the target range for the patient.[name] appears in no distress. Notes Wound exam; dorsal first toe. Reasonably extensive wound continues to probe to bone proximally. I suspect this wound deteriorated secondary to possible MSSA infection. She does not have overt infection currently although I would like to continue her on doxycycline for 2 further weeks. I still cannot get down to a viable surface on this the entire wound is deep. Electronic Signature(s) Signed: 01/29/2017 5:06:34 PM By: Linton Ham MD Entered By: Linton Ham on 01/29/2017 10:14:16 [place], [name] ([id number]) -------------------------------------------------------------------------------- Physician Orders Details Patient Name: Meyn, Shironda V. Date of Service: 01/29/2017 9:15 AM Medical Record Patient Account Number: [id number] Number: Treating RN: Baruch Gouty, RN, BSN, Velva Harman January 30, 1941 [phone] y.o. Other Clinician: Date of Birth/Sex: Female) Treating ROBSON, [name] Primary Care Provider: Beverlyn Roux Provider/Extender: G Referring Provider: Dorthula Nettles in Treatment: 12 Verbal / Phone Orders: No Diagnosis Coding Wound Cleansing Wound #7 Right,Dorsal Metatarsal head first o Clean wound with Normal Saline. o Cleanse wound with mild soap and water Anesthetic Wound #7 Right,Dorsal Metatarsal head first o Topical Lidocaine 4% cream applied to wound bed prior to debridement Primary Wound Dressing Wound #7 Right,Dorsal Metatarsal head first o Iodoflex Secondary Dressing Wound #7 Right,Dorsal Metatarsal head first o Dry Gauze o Conform/Kerlix Dressing Change Frequency Wound #7 Right,Dorsal Metatarsal head first o Change dressing every other day. Follow-up Appointments Wound #7 Right,Dorsal Metatarsal head first o Return Appointment in 1 week. Edema Control Wound #7 Right,Dorsal Metatarsal head first o Elevate legs to the level of the heart and pump ankles as often as  possible Additional Orders / Instructions Wound #7 Right,Dorsal Metatarsal head first o Increase protein intake. Scalera, [name] MAFFEO ([id number]) o Activity as tolerated Home Health Wound #7 Right,Dorsal Metatarsal head first o [place] [facility] - [facility] twice weekly, [facility]-Wednesday, teach family member to change on off day. o Home Health Nurse may visit PRN to address patientos wound care needs. o FACE TO FACE ENCOUNTER: MEDICARE and MEDICAID PATIENTS: I certify that this patient is under my care and that I had a face-to-face encounter that meets the physician face-to-face encounter requirements with this patient on this date. The encounter with the patient was in whole or in part for the following MEDICAL CONDITION: (primary reason for [place]) MEDICAL NECESSITY: I certify, that based on my findings, NURSING services are a medically necessary home health service. HOME BOUND STATUS: I certify that my clinical findings support that this patient is homebound (i.e., Due to illness or injury, pt requires aid of supportive devices such as crutches, cane, wheelchairs, walkers, the use of special transportation or the assistance of another person to leave their place of residence. There is a normal inability to leave the home and doing so requires considerable and taxing effort. Other absences are for medical reasons / religious services and are infrequent or of  short duration when for other reasons). o If current dressing causes regression in wound condition, may D/C ordered dressing product/s and apply Normal Saline Moist Dressing daily until next [place] / Other MD appointment. [place] of regression in wound condition at [phone]. o Please direct any NON-WOUND related issues/requests for orders to patient's Primary Care Physician Medications-please add to medication list. Wound #7 Right,Dorsal Metatarsal head  first o P.O. Antibiotics - Continue doxycycline Patient Medications Allergies: penicillin, sulfamethizole, iodine, Shellfish Containing Products, eggshell membrane Notifications Medication Indication Start End doxycycline monohydrate cellulitis of Rt 01/29/2017 1st toe DOSE bid - oral 100 mg capsule - 1 bid capsule oral Notes Wait for a month to redo benefit verification for [facility]. Electronic Signature(s) Signed: 01/29/2017 10:20:09 AM By: Linton Ham MD Entered By: Linton Ham on 01/29/2017 10:20:08 Dejoy, [name] ([id number]) -------------------------------------------------------------------------------- Problem List Details Patient Name: [name], [name] Date of Service: 01/29/2017 9:15 AM Medical Record Patient Account Number: [id number] Number: Treating RN: Baruch Gouty, RN, BSN, Velva Harman Nov 08, 1940 [phone] y.o. 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 Date of Service: 01/29/2017 9:15 AM Medical Record Patient Account Number: [id number] Number: Treating RN: Baruch Gouty, RN, BSN, Velva Harman June 16, 1941 [phone] y.o. Other Clinician: Date of Birth/Sex: Female) Treating ROBSON, [name] Primary Care Provider: Beverlyn Roux Provider/Extender: G Referring Provider: Dorthula Nettles in Treatment: 12 Subjective History of Present Illness (HPI) 08/13/16: This is a 76 year old woman who came predominantly for review of 3 cm in diameter circular wound to the left anterior lateral leg. She was in the ER on 08/01/16 I reviewed their notes. There was apparently pus coming out of the wound at that time and the patient arrived requesting debridement which they don't do in the emergency room. Nevertheless I can't see that they did any x-rays. There were no cultures done. She is a type II diabetic and I a note after the patient was in the clinic that she had a bypass graft from the popliteal to the tibial on the right on 02/28/16. She also had a right greater saphenous vein harvest on the same date for arterial bypass. She is going to have vascular studies including ABIs T ABIs on the right on 08/28/16. The patient's surgery was on 02/28/16 by Dr. Vallarie Mare she had a right below the knee popliteal artery to peroneal artery bypass with reverse greater saphenous vein and an endarterectomy of the mid segment peroneal artery. Postoperatively she had a strong mild monophasic peroneal signal with a pink foot. It would appear that the patient is had some nonhealing in the surgical saphenous  vein harvest site on the left leg. Surprisingly looking [facility] I cannot see much information about this at all. Dr. Lucious Groves notes from 05/29/16 show that the patient's wounds "are not healed" the right first metatarsal  wound healed but then opened back up. 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 At that point the patient had a wound VAC to her right calf with regards to the wounds on her dorsal right toe would appear that these are felt to be arterial wounds. She has had surgery on the metatarsal phalangeal in 2015 I Dr. Doran Durand secondary to a right metatarsal phalangeal joint fracture. She is apparently had discoloration around this area since then. 08/28/16; patient arrives with her wounds in much the same condition. The linear vein harvest site and the circular wound below it which I think was a blister. She also has to probing holes in her right great toe and a necrotic eschar on the right second toe. Because of these being arterial wounds I reduced her compression from 3-2 layers this seems to of done satisfactorily she has not had any problems. I cannot see that she is actually had an x-ray ====== 11/06/16 the patient comes in for evaluation of her right lower extremity ulcers. She was here in January 2018 for 2 visits subsequently ended up in the hospital with pneumonia and then to rehabilitation. She has now been discharged from rehabilitation and is home. She has multiple ulcerations to her right lower extremity including the foot and toes. She does have home health in place and they have been placing alginate to the ulcers. She is followed by Dr. Bridgett Larsson of vascular medicine. 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 She recently saw him on 3/23. In office ABIs were: Basto, [name] ([id number]) Right 0.48 with monophasic flow to the DP, PT, peroneal Left 0.63 with monophasic flow to the DP and PT Her arterial studies indicated a patent right below knee popliteal to peroneal bypass She had an MRI in February 2008 that was negative  frosty myelitis but this showed general soft tissue edema in the right foot and lower extremity concerning for cellulitis She is a diabetic, managed with insulin. Her hemoglobin A1c in December 2017 was 8.4 which is a trend up from previous levels. She had blood work in February 2018 which revealed an albumin of 2.6 this appears to be relatively acute as an albumin in November 2017 was 3.7 11/13/16; this is a patient I have not seen since February who is readmitted to our clinic last week. 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 Bridgett Larsson. I have reviewed Dr. Lianne Moris notes from March/23/18. Doppler ABI on that date showed an ABI on the right of 0.48 and on the left of 0.63. Dorsalis pedis waveforms were monophasic bilaterally. There was no waveforms detected at the posterior tibial on the right, monophasic on the left. Dr. Lianne Moris comments were that this patient would have follow-up vascular studies in 3 months including ABIs and right lower extremity arterial duplex. She had an MRI in February that was negative for osteomyelitis but showed generalized edema in the foot. Last albumin I see was in January at 3.4 we have been using Santyl to the 4 wounds on the right leg  the patient is noted today to have widespread edema well up towards her groin this is pitting 2-3+. 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 Normal ejection fraction. 11/20/16; patient has a follow-up appointment with Dr. Bridgett Larsson on April 23. She is still complaining of a lot of pain in the right foot and right leg. It is not clear to me that this is at all positional however I think it is clear claudication with minimal activity perhaps at rest. At our suggestion she is return to her primary physician's office tomorrow with regards to her pitting lower extremity bilateral edema that I reviewed in detail last  week 11/27/16; the follow-up with Dr. Bridgett Larsson was actually on May 23 on April 23 as I stated in my note last week. N/A case all of her wounds seems somewhat smaller. The 2 on the right leg are definitely smaller. The areas on the dorsal right first toe, right third toe and the lateral part of the right fifth metatarsal head all looks smaller but have tightly adherent surfaces. We have been using Santyl 12/04/16; follow-up with Dr. Bridgett Larsson on May 23. 2 small open wounds on the right leg continue to get smaller. The area on the right third total lateral aspect of the right fifth toe also look better. The remaining area on the dorsal first toe still has some depth to it. We have been using Santyl to the toes and collagen on the right 12/11/16; according to patient's husband the follow-up with Dr. Bridgett Larsson is not until July. All of the wounds on the right leg are measuring smaller. We have been using some combination of Prisma and Santyl although I think we can go to straight Prisma today. There may have been some confusion with home health about the primary dressing orders here. 12/25/16; the patient has had some healing this week. The area on her right lateral fifth metatarsal head, right third toe are both healed lower right leg is healed. In the vein harvest site superiorly she has one superficial open area. On the dorsal aspect of her right great toe/MTP joint the wound is now divided into 2 however the proximal area is deep and there is palpable bone 01/01/17; still an open area in the middle of her original right surgical scar. The area on the right third toe and right lateral fifth metatarsal head remained closed. Problematic area on the dorsal aspect of the toe. Previous surgery in this area line 01/08/17 small open area on the original scar on her upper anterior leg although this is closing. X-ray I did of the right first toe did not show underlying bony abnormality. Still this area on the dorsal first toe  probes to bone. We have been using Prisma 01/15/17; small open area in the original scar in the upper anterior leg is almost fully closed. She has 2 open areas over the dorsal aspect of the right first toe that probes to bone. Used and a form starting last week. Vigorous bone scraping that I did last week showed few methicillin sensitive staph aureus. She is allergic to [name], [name] ([id number]) penicillin and sulfa drugs. I'[name] going to give her 2 weeks of doxycycline. She will need an MRI with contrast. 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 She continues on doxycycline for a bone scraping that showed methicillin sensitive staph aureus [allergic  to penicillin and sulfa]. We have been using Endo form to the wound. 01/29/17; surprisingly her MRI did not show osteomyelitis. She continues on doxycycline for a bone scraping that showed methicillin sensitive staph aureus with allergies to penicillin and sulfa. We have been using Endo form to the wound. Unfortunately I cannot get a surface on this visit looks like it is able to support a healing state. Proximally there is still exposed bone. There is no overt soft tissue tenderness. Her MRI did show a previous fracture was surgery to this area but no hardware Objective Constitutional Sitting or standing Blood Pressure is within target range for patient.. Pulse regular and within target range for patient.[name] Respirations regular, non-labored and within target range.. Temperature is normal and within the target range for the patient.[name] appears in no distress. Vitals Time Taken: 9:19 AM, Height: 64 in, Weight: 200 lbs, BMI: 34.3, Temperature: 97.6 F, Pulse: 79 bpm, Respiratory Rate: 16 breaths/min, Blood Pressure: 150/71 mmHg. General Notes: Wound exam; dorsal first toe. Reasonably extensive wound continues to probe to bone proximally. I suspect this wound deteriorated  secondary to possible MSSA infection. She does not have overt infection currently although I would like to continue her on doxycycline for 2 further weeks. I still cannot get down to a viable surface on this the entire wound is deep. Integumentary (Hair, Skin) Wound #7 status is Open. Original cause of wound was Gradually Appeared. The wound is located on the Right,Dorsal Metatarsal head first. The wound measures 2.5cm length x 1cm width x 0.8cm depth; 1.963cm^2 area and 1.571cm^3 volume. There is bone, muscle, and Fat Layer (Subcutaneous Tissue) Exposed exposed. There is no tunneling or undermining noted. There is a large amount of serous drainage noted. The wound margin is flat and intact. There is small (1-33%) pink, friable granulation within the wound bed. There is a medium (34-66%) amount of necrotic tissue within the wound bed including Adherent Slough. The periwound skin appearance exhibited: Induration, Scarring, Ecchymosis, Hemosiderin Staining, Mottled. The periwound skin appearance did not exhibit: Maceration. Periwound temperature was noted as No Abnormality. The periwound has tenderness on palpation. 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 There was a Skin/Subcutaneous Tissue Debridement ([id number]) debridement with total area of 2.5 sq cm performed by Ricard Dillon, MD. with the following instrument(s): Curette to remove Non-Viable tissue/material including Bone, Fat Layer (and Subcutaneous Tissue) Exposed, Fibrin/Slough, and Subcutaneous after achieving pain control using Lidocaine 4% Topical Solution. A time out was conducted at 09:36, prior to the start of the procedure. A Minimum amount of bleeding was controlled with Pressure. The procedure was tolerated well with a pain level of 0 throughout and a pain level of 0 following the procedure. Post Debridement Measurements: 2.5cm length x 1cm width x 0.8cm depth; 1.571cm^3 volume. Character of  Wound/Ulcer Post Debridement requires further debridement. Severity of Tissue Post Debridement is: Bone involvement without necrosis. Post procedure Diagnosis Wound #7: Same as Pre-Procedure Plan Wound Cleansing: Wound #7 Right,Dorsal Metatarsal head first: Clean wound with Normal Saline. Cleanse wound with mild soap and water Anesthetic: Wound #7 Right,Dorsal Metatarsal head first: Topical Lidocaine 4% cream applied to wound bed prior to debridement Leon, Cletus V. ([id number]) Primary Wound Dressing: Wound #7 Right,Dorsal Metatarsal head first: Iodoflex Secondary Dressing: Wound #7 Right,Dorsal Metatarsal head first: Dry Gauze Conform/Kerlix Dressing Change Frequency: Wound #7 Right,Dorsal Metatarsal head first: Change dressing every other day. Follow-up Appointments: Wound #7 Right,Dorsal Metatarsal head first: Return Appointment in 1 week. Edema Control: Wound #7 Right,Dorsal Metatarsal head first: Elevate legs to the level of the heart and pump ankles as often as possible Additional Orders / Instructions: Wound #7 Right,Dorsal Metatarsal head first: Increase protein intake. Activity as tolerated Home Health: Wound #7  Right,Dorsal Metatarsal head first: [facility] weekly, [facility], teach family member to change on off day. Home Health Nurse may visit [facility] to address patient s wound care needs. FACE TO FACE ENCOUNTER: MEDICARE and MEDICAID PATIENTS: I certify that this patient is under my care and that I had a face-to-face encounter that meets the physician face-to-face encounter requirements with this patient on this date. The encounter with the patient was in whole or in part for the following MEDICAL CONDITION: (primary reason for [place]) MEDICAL NECESSITY: I certify, that based on my findings, NURSING services are a medically necessary home health service. HOME BOUND STATUS: I certify that my clinical findings support that this patient is homebound (i.e., Due to illness or injury, pt requires aid of supportive devices such as crutches, cane, wheelchairs, walkers, the use of special transportation or the assistance of another person to leave their place of residence. There is a normal inability to leave the home and doing so requires considerable and taxing effort. Other absences are for medical reasons / religious services and are infrequent or of short duration when for other reasons). If current dressing causes regression in wound condition, may D/C ordered dressing product/s and apply Normal Saline Moist Dressing daily until next [place] / Other MD appointment. [place] of regression in wound condition at [phone]. Please direct any NON-WOUND related issues/requests for orders to patient's Primary Care Physician Medications-please add to medication list.: Wound #7 Right,Dorsal Metatarsal head first: P.O. Antibiotics - Continue doxycycline The following medication(s) was prescribed: doxycycline monohydrate oral 100 mg capsule bid 1 bid capsule oral for cellulitis of Rt 1st toe starting 01/29/2017 General  Notes: Wait for a month to redo benefit verification for [facility]. Wingard, Azarya V. ([id number]) #1 worrisome wound on the dorsal right first toe. #2 I'[name] going to extend her doxycycline for 2 weeks. She is status post post-revascularization by Dr. Bridgett Larsson. She may need to go back to see him for further review but I would like to review his notes first. #3 MRI did not show underlying osteomyelitis but a vigorous bone scraping from 2 weeks ago showed MSSA. She may have had a soft tissue infection as well. Electronic Signature(s) Signed: 01/29/2017 10:20:44 AM By: Linton Ham MD Entered By: Linton Ham on 01/29/2017 10:20:43 Baena, Zamara [name] ([id number]) -------------------------------------------------------------------------------- SuperBill Details Patient Name: Friis, Tequilla V. Date of Service: 01/29/2017 Medical Record Patient Account Number: [id number] Number: Treating RN: Baruch Gouty, RN, BSN, Velva Harman [birth year] [phone] y.o. 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 I70.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 of foot Facility Procedures CPT4: Description Modifier Quantity Code [id number] - DEB SUBQ TISSUE 20 SQ CM/< 1 ICD-10 Description Diagnosis 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

## 2017-02-12 ENCOUNTER — Encounter: Payer: Medicare HMO | Attending: Physician Assistant | Admitting: Physician Assistant

## 2017-02-12 DIAGNOSIS — Z8673 Personal history of transient ischemic attack (TIA), and cerebral infarction without residual deficits: Secondary | ICD-10-CM | POA: Insufficient documentation

## 2017-02-12 DIAGNOSIS — E11621 Type 2 diabetes mellitus with foot ulcer: Secondary | ICD-10-CM | POA: Diagnosis not present

## 2017-02-12 DIAGNOSIS — D649 Anemia, unspecified: Secondary | ICD-10-CM | POA: Diagnosis not present

## 2017-02-12 DIAGNOSIS — Z794 Long term (current) use of insulin: Secondary | ICD-10-CM | POA: Diagnosis not present

## 2017-02-12 DIAGNOSIS — I350 Nonrheumatic aortic (valve) stenosis: Secondary | ICD-10-CM | POA: Insufficient documentation

## 2017-02-12 DIAGNOSIS — Z880 Allergy status to penicillin: Secondary | ICD-10-CM | POA: Insufficient documentation

## 2017-02-12 DIAGNOSIS — E1152 Type 2 diabetes mellitus with diabetic peripheral angiopathy with gangrene: Secondary | ICD-10-CM | POA: Diagnosis not present

## 2017-02-12 DIAGNOSIS — Z882 Allergy status to sulfonamides status: Secondary | ICD-10-CM | POA: Insufficient documentation

## 2017-02-12 DIAGNOSIS — L97219 Non-pressure chronic ulcer of right calf with unspecified severity: Secondary | ICD-10-CM | POA: Insufficient documentation

## 2017-02-12 DIAGNOSIS — Z888 Allergy status to other drugs, medicaments and biological substances status: Secondary | ICD-10-CM | POA: Diagnosis not present

## 2017-02-12 DIAGNOSIS — L97519 Non-pressure chronic ulcer of other part of right foot with unspecified severity: Secondary | ICD-10-CM | POA: Diagnosis not present

## 2017-02-12 DIAGNOSIS — I110 Hypertensive heart disease with heart failure: Secondary | ICD-10-CM | POA: Diagnosis not present

## 2017-02-12 DIAGNOSIS — Z91013 Allergy to seafood: Secondary | ICD-10-CM | POA: Insufficient documentation

## 2017-02-12 DIAGNOSIS — I70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 of foot: Secondary | ICD-10-CM | POA: Insufficient documentation

## 2017-02-12 DIAGNOSIS — E43 Unspecified severe protein-calorie malnutrition: Secondary | ICD-10-CM | POA: Insufficient documentation

## 2017-02-12 DIAGNOSIS — I509 Heart failure, unspecified: Secondary | ICD-10-CM | POA: Insufficient documentation

## 2017-02-12 DIAGNOSIS — E11622 Type 2 diabetes mellitus with other skin ulcer: Secondary | ICD-10-CM | POA: Insufficient documentation

## 2017-02-12 DIAGNOSIS — Z91012 Allergy to eggs: Secondary | ICD-10-CM | POA: Insufficient documentation

## 2017-02-12 DIAGNOSIS — I70232 Atherosclerosis 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 of calf: Secondary | ICD-10-CM | POA: Insufficient documentation

## 2017-02-12 DIAGNOSIS — E785 Hyperlipidemia, unspecified: Secondary | ICD-10-CM | POA: Diagnosis not present

## 2017-02-14 NOTE — Progress Notes (Signed)
ONELIA, CADMUS ([id number]) Visit Report for 02/12/2017 Chief Complaint Document Details Patient Name: [name], [name] Date of Service: 02/12/2017 10:15 AM Medical Record Number: [id number] Patient Account Number: [id number] Date of Birth/Sex: February 10, 1941 (76 y.o. Female) Treating RN: Afful, RN, BSN, Velva Harman Primary Care Provider: Beverlyn Roux Other Clinician: Referring Provider: Beverlyn Roux Treating Provider/Extender: Melburn Hake, Camaria Gerald Weeks in Treatment: 14 Information Obtained from: Patient Chief Complaint The patient is here for initial evaluation of RLE and Right foot/toe ulcers Electronic Signature(s) Signed: 02/13/2017 10:10:39 AM By: Worthy Keeler PA-C Entered By: Worthy Keeler on 02/12/2017 17:40:04 Perrott, Raneem [name]. ([id number]) -------------------------------------------------------------------------------- HPI Details Patient Name: [name], [name] Date of Service: 02/12/2017 10:15 AM Medical Record Number: [id number] Patient Account Number: [id number] Date of Birth/Sex: 1941-06-12 (76 y.o. Female) Treating RN: Baruch Gouty, RN, BSN, Velva Harman Primary Care Provider: Beverlyn Roux Other Clinician: Referring Provider: Beverlyn Roux Treating Provider/Extender: Melburn Hake, Asees Manfredi Weeks in Treatment: 14 History of Present Illness HPI Description: 08/13/16: This is a 76 year old woman who came predominantly for review of 3 cm in diameter circular wound to the left anterior lateral leg. She was in the ER on 08/01/16 I reviewed their notes. There was apparently pus coming out of the wound at that time and the patient arrived requesting debridement which they don't do in the emergency room. Nevertheless I can't see that they did any x-rays. There were no cultures done. She is a type II diabetic and I a note after the patient was in the clinic that she had a bypass graft from the popliteal to the tibial on the right on 02/28/16. She also had a right greater saphenous vein harvest on the same date for arterial  bypass. She is going to have vascular studies including ABIs T ABIs on the right on 08/28/16. The patient's surgery was on 02/28/16 by Dr. Vallarie Mare she had a right below the knee popliteal artery to peroneal artery bypass with reverse greater saphenous vein and an endarterectomy of the mid segment peroneal artery. Postoperatively she had a strong mild monophasic peroneal signal with a pink foot. It would appear that the patient is had some nonhealing in the surgical saphenous vein harvest site on the left leg. Surprisingly [facility] I cannot see much information about this at all. Dr. Lucious Groves notes from 05/29/16 show that the patient's wounds "are not healed" the right first metatarsal wound healed but then opened back up. 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 At that point the patient had a wound VAC to her right calf with regards to the wounds on her dorsal right toe would appear that these are felt to be arterial wounds. She has had surgery on the metatarsal phalangeal in 2015 I Dr. Doran Durand secondary to a right metatarsal phalangeal joint fracture. She is apparently had discoloration around this area since then. 08/28/16; patient arrives with her wounds in much the same condition. The linear vein harvest site and the circular wound below it which I think was a blister. She also has to probing holes in her right great toe and a necrotic eschar on the right second toe. Because of these being arterial wounds I reduced her compression from 3-2 layers this seems to of done satisfactorily she has not had any problems. I cannot see that she is actually had an x-ray ====== 11/06/16 the patient comes in for evaluation of her right lower extremity ulcers.  She was here in January 2018 for 2 visits subsequently ended up in the hospital with pneumonia and then to rehabilitation. She has  now been discharged from rehabilitation and is home. She has multiple ulcerations to her right lower extremity including the foot and toes. She does have home health in place and they have been placing alginate to the ulcers. She is followed by Dr. Bridgett Larsson of vascular medicine. 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 She recently saw him on 3/23. In office ABIs were: Right 0.48 with monophasic flow to the DP, PT, peroneal Left 0.63 with monophasic flow to the DP and PT [name], [name] ([id number]) Her arterial studies indicated a patent right below knee popliteal to peroneal bypass She had an MRI in February 2008 that was negative frosty myelitis but this showed general soft tissue edema in the right foot and lower extremity concerning for cellulitis She is a diabetic, managed with insulin. Her hemoglobin A1c in December 2017 was 8.4 which is a trend up from previous levels. She had blood work in February 2018 which revealed an albumin of 2.6 this appears to be relatively acute as an albumin in November 2017 was 3.7 11/13/16; this is a patient I have not seen since February who is readmitted to our clinic last week. 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 Bridgett Larsson. I have reviewed Dr. Lianne Moris notes from March/23/18. Doppler ABI on that date showed an ABI on the right of 0.48 and on the left of 0.63. Dorsalis pedis waveforms were monophasic bilaterally. There was no waveforms detected at the posterior tibial on the right, monophasic on the left. Dr. Lianne Moris comments were that this patient would have follow-up vascular studies in 3 months including ABIs and right lower extremity arterial duplex. She had an MRI in February that was negative for osteomyelitis but showed generalized edema in the foot. Last albumin I see was in January at 3.4 we have been using Santyl  to the 4 wounds on the right leg the patient is noted today to have widespread edema well up towards her groin this is pitting 2-3+. 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 Normal ejection fraction. 11/20/16; patient has a follow-up appointment with Dr. Bridgett Larsson on April 23. She is still complaining of a lot of pain in the right foot and right leg. It is not clear to me that this is at all positional however I think it is clear claudication with minimal activity perhaps at rest. At our suggestion she is return to her primary physician's office tomorrow with regards to her pitting lower extremity bilateral edema that I reviewed in detail last week 11/27/16; the follow-up with Dr. Bridgett Larsson was actually on May 23 on April 23 as I stated in my note last week. N/A case all of her wounds seems somewhat smaller. The 2 on the right leg are definitely smaller. The areas on the dorsal right first toe, right third toe and the lateral part of the right fifth metatarsal head all looks smaller but have tightly adherent surfaces. We have been using Santyl 12/04/16; follow-up with Dr. Bridgett Larsson on May 23. 2 small open wounds on the right leg continue to get smaller. The area on the right third total lateral aspect of the right fifth toe also look better.  The remaining area on the dorsal first toe still has some depth to it. We have been using Santyl to the toes and collagen on the right 12/11/16; according to patient's husband the follow-up with Dr. Bridgett Larsson is not until July. All of the wounds on the right leg are measuring smaller. We have been using some combination of Prisma and Santyl although I think we can go to straight Prisma today. There may have been some confusion with home health about the primary dressing orders here. 12/25/16; the patient has had some healing this week. The area on her right lateral fifth metatarsal head, right third toe are both healed  lower right leg is healed. In the vein harvest site superiorly she has one superficial open area. On the dorsal aspect of her right great toe/MTP joint the wound is now divided into 2 however the proximal area is deep and there is palpable bone 01/01/17; still an open area in the middle of her original right surgical scar. The area on the right third toe and right lateral fifth metatarsal head remained closed. Problematic area on the dorsal aspect of the toe. Previous surgery in this area line 01/08/17 small open area on the original scar on her upper anterior leg although this is closing. X-ray I did of the right first toe did not show underlying bony abnormality. Still this area on the dorsal first toe probes to bone. We have been using Prisma 01/15/17; small open area in the original scar in the upper anterior leg is almost fully closed. She has 2 open areas over the dorsal aspect of the right first toe that probes to bone. Used and a form starting last week. Vigorous bone scraping that I did last week showed few methicillin sensitive staph aureus. She is allergic to penicillin and sulfa drugs. I'm going to give her 2 weeks of doxycycline. She will need an MRI with contrast. 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 ([id number]) 01/22/17; patient has her MRI this afternoon. She continues on doxycycline for a bone scraping that showed methicillin sensitive staph aureus [allergic to penicillin and sulfa]. We have been using Endo form to the wound. 01/29/17; surprisingly her MRI did not show osteomyelitis. She continues on doxycycline for a bone scraping that showed methicillin sensitive staph aureus with allergies to penicillin and sulfa. We have been using Endo form to the wound. Unfortunately I cannot get a surface on this visit looks like it is able to support a healing state. Proximally there is still exposed bone. There is no overt soft tissue  tenderness. Her MRI did show a previous fracture was surgery to this area but no hardware 02/12/17 on evaluation today patient appears to be doing well in regard to her lower extremity wound. She does have some mild discomfort but this is minimal. There's no evidence of infection. Her left great toe nail has somewhat been lifted up and there is a little bit of slight bleeding underneath but this is still firmly attached. Electronic Signature(s) Signed: 02/13/2017 10:10:39 AM By: Worthy Keeler PA-C Entered By: Worthy Keeler on 02/12/2017 17:40:50 [name], [name] ([id number]) -------------------------------------------------------------------------------- Physical Exam Details Patient Name: Sturdivant, Enid [name]. Date of Service: 02/12/2017 10:15 AM Medical Record Number: [id number] Patient Account Number: [id number] Date of Birth/Sex: 30-Apr-1941 (76 y.o. Female) Treating RN: Afful, RN, BSN, [employer] Primary Care Provider: Beverlyn Roux Other Clinician: Referring Provider: Beverlyn Roux Treating Provider/Extender: Joaquim Lai  III, Oluwatobi Ruppe Weeks in Treatment: 27 Constitutional Well-nourished and well-hydrated in no acute distress. Respiratory normal breathing without difficulty. clear to auscultation bilaterally. Cardiovascular regular rate and rhythm with normal S1, S2. Psychiatric this patient is able to make decisions and demonstrates good insight into disease process. Alert and Oriented x 3. pleasant and cooperative. Notes Going to defer any debridement today is patient's wound bed appears to be fairly good in my opinion and I see no reason for her to undergo debridement. Electronic Signature(s) Signed: 02/13/2017 10:10:39 AM By: Worthy Keeler PA-C Entered By: Worthy Keeler on 02/12/2017 17:41:19 [name], [name] ([id number]) -------------------------------------------------------------------------------- Physician Orders Details Patient Name: [name], [name] Date of Service: 02/12/2017 10:15  AM Medical Record Number: [id number] Patient Account Number: [id number] Date of Birth/Sex: 12-12-1940 (76 y.o. 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 o Cleanse wound with mild soap and water Anesthetic Wound #7 Right,Dorsal Metatarsal head first o Topical Lidocaine 4% cream applied to wound bed prior to debridement Primary Wound Dressing Wound #10 Left Toe Great o Other: - Triple antibiotic Wound #7 Right,Dorsal Metatarsal head first o Iodoflex Secondary Dressing Wound #10 Left Toe Great o Dry Gauze o Conform/Kerlix Wound #7 Right,Dorsal Metatarsal head first o Dry Gauze o Conform/Kerlix [name], [name] ([id number]) Dressing Change Frequency Wound #7 Right,Dorsal Metatarsal head first o Change dressing every other day. Follow-up Appointments Wound #10 Left Toe Great o Return Appointment in 1 week. Wound #7 Right,Dorsal Metatarsal head first o Return Appointment in 1 week. Edema Control Wound #10 Left Toe Great o Elevate legs to the level of the heart and pump ankles as often as possible Wound #7 Right,Dorsal  Metatarsal head first o Elevate legs to the level of the heart and pump ankles as often as possible Additional Orders / Instructions Wound #7 Right,Dorsal Metatarsal head first o Increase protein intake. o Activity as tolerated Home Health Wound #10 Left [place] Visits - [facility] twice weekly, [facility] o Home Health Nurse may visit PRN to address patientos wound care needs. o FACE TO FACE ENCOUNTER: MEDICARE and MEDICAID PATIENTS: I certify that this patient is under my care and that I had a face-to-face encounter that meets the physician face-to-face encounter requirements with this patient on this date. The encounter with the patient was in whole or in part for the following MEDICAL CONDITION: (primary reason for [place]) MEDICAL NECESSITY: I certify, that based on my findings, NURSING services are a medically necessary home health service. HOME BOUND STATUS: I certify that my clinical findings support that this patient is homebound (i.e., Due to illness or injury, pt requires aid of supportive devices such as crutches, cane, wheelchairs, walkers, the use of special transportation or the assistance of another person to  leave their place of residence. There is a normal inability to leave the home and doing so requires considerable and taxing effort. Other absences are for medical reasons / religious services and are infrequent or of short duration when for other reasons). o If current dressing causes regression in wound condition, may D/C ordered dressing product/s and apply Normal Saline Moist Dressing daily until next [facility] / Other MD appointment. [place] of regression in wound condition at [phone]. o Please direct any NON-WOUND related issues/requests for orders to patient's Primary Care Physician Wound #7 Right,Dorsal Metatarsal head first o [place] Visits -  [facility] twice weekly, [facility]-Wednesday KEIRSTYN, AYDT ([id number]) o [place] Nurse may visit PRN to address patientos wound care needs. o FACE TO FACE ENCOUNTER: MEDICARE and MEDICAID PATIENTS: I certify that this patient is under my care and that I had a face-to-face encounter that meets the physician face-to-face encounter requirements with this patient on this date. The encounter with the patient was in whole or in part for the following MEDICAL CONDITION: (primary reason for [place]) MEDICAL NECESSITY: I certify, that based on my findings, NURSING services are a medically necessary home health service. HOME BOUND STATUS: I certify that my clinical findings support that this patient is homebound (i.e., Due to illness or injury, pt requires aid of supportive devices such as crutches, cane, wheelchairs, walkers, the use of special transportation or the assistance of another person to leave their place of residence. There is a normal inability to leave the home and doing so requires considerable and taxing effort. Other absences are for medical reasons / religious services and are infrequent or of short duration when for other reasons). o If current dressing causes regression in wound condition, may D/C ordered dressing product/s and apply Normal Saline Moist Dressing daily until next [place] / Other MD appointment. [place] of regression in wound condition at [phone]. o Please direct any NON-WOUND related issues/requests for orders to patient's Primary Care Physician Notes Patient's wounds appeared to be doing well on evaluation today. We are gonna continue with the Current wound care measures per above. If anything worsens in the interim she will contact our office for additional recommendations. Electronic Signature(s) Signed: 02/13/2017 10:10:39 AM By: Worthy Keeler PA-C Previous Signature: 02/12/2017 4:42:39 PM  Version By: Regan Lemming BSN, RN Entered By: Worthy Keeler on 02/12/2017 17:45:36 Moates, Carissa [name]. ([id number]) -------------------------------------------------------------------------------- Problem List Details Patient Name: Hutcherson, Gustavo [name]. Date of Service: 02/12/2017 10:15 AM Medical Record Number: [id number] Patient Account Number: [id number] Date of Birth/Sex: 10-29-1940 (76 y.o. 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 with unspecified 11/06/2016 Yes severity E11.52 Type 2 diabetes mellitus 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 I70.235 Atherosclerosis of native arteries of right leg with 11/06/2016 Yes ulceration of other part of foot Inactive Problems Resolved Problems Electronic Signature(s) Signed: 02/13/2017 10:10:39 AM By: Worthy Keeler PA-C Entered By: Melburn Hake,  Bryor Rami on 02/12/2017 17:36:19 [name], [name] ([id number]) [name], [name] VMarland Kitchen ([id number]) -------------------------------------------------------------------------------- Progress Note Details Patient Name: [name], [name] Date of Service: 02/12/2017 10:15 AM Medical Record Number: [id number] Patient Account Number: [id number] Date of Birth/Sex: 12/28/1940 (76 y.o. Female) Treating RN: Afful, RN, BSN, Velva Harman Primary Care Provider: Beverlyn Roux Other Clinician: Referring Provider: Beverlyn Roux Treating  Provider/Extender: Melburn Hake, Hye Trawick Weeks in Treatment: 14 Subjective Chief Complaint Information obtained from Patient The patient is here for initial evaluation of RLE and Right foot/toe ulcers History of Present Illness (HPI) 08/13/16: This is a 76 year old woman who came predominantly for review of 3 cm in diameter circular wound to the left anterior lateral leg. She was in the ER on 08/01/16 I reviewed their notes. There was apparently pus coming out of the wound at that time and the patient arrived requesting debridement which they don't do in the emergency room. Nevertheless I can't see that they did any x-rays. There were no cultures done. She is a type II diabetic and I a note after the patient was in the clinic that she had a bypass graft from the popliteal to the tibial on the right on 02/28/16. She also had a right greater saphenous vein harvest on the same date for arterial bypass. She is going to have vascular studies including ABIs T ABIs on the right on 08/28/16. The patient's surgery was on 02/28/16 by Dr. Vallarie Mare she had a right below the knee popliteal artery to peroneal artery bypass with reverse greater saphenous vein and an endarterectomy of the mid segment peroneal artery. Postoperatively she had a strong mild monophasic peroneal signal with a pink foot. It would appear that the patient is had some nonhealing in the surgical saphenous vein harvest site on the left leg. Surprisingly looking [facility] I cannot see much information about this at all. Dr. Lucious Groves notes from 05/29/16 show that the patient's wounds "are not healed" the right first metatarsal wound healed but then opened back up. 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 At that point the patient had a wound VAC to her right calf with regards to the wounds on her dorsal right toe would appear that these  are felt to be arterial wounds. She has had surgery on the metatarsal phalangeal in 2015 I Dr. Doran Durand secondary to a right metatarsal phalangeal joint fracture. She is apparently had discoloration around this area since then. 08/28/16; patient arrives with her wounds in much the same condition. The linear vein harvest site and the circular wound below it which I think was a blister. She also has to probing holes in her right great toe and a necrotic eschar on the right second toe. Because of these being arterial wounds I reduced her compression from 3-2 layers this seems to of done satisfactorily she has not had any problems. I cannot see that she is actually had an x-ray ====== 11/06/16 the patient comes in for evaluation of her right lower extremity ulcers. She was here in January 2018 for 2 visits subsequently ended up in the hospital with pneumonia and then to rehabilitation. She has now been discharged from rehabilitation and is home. She has multiple ulcerations to her right lower extremity including the foot and toes. She does have home health in place and they have been placing alginate to the Leavy, Lateefah [name]. ([id number]) ulcers. She is followed by Dr. Bridgett Larsson of vascular  medicine. 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 She recently saw him on 3/23. In office ABIs were: Right 0.48 with monophasic flow to the DP, PT, peroneal Left 0.63 with monophasic flow to the DP and PT Her arterial studies indicated a patent right below knee popliteal to peroneal bypass She had an MRI in February 2008 that was negative frosty myelitis but this showed general soft tissue edema in the right foot and lower extremity concerning for cellulitis She is a diabetic, managed with insulin. Her hemoglobin A1c in December 2017 was 8.4 which is a trend up from previous levels. She had blood work in February 2018 which revealed an albumin of 2.6  this appears to be relatively acute as an albumin in November 2017 was 3.7 11/13/16; this is a patient I have not seen since February who is readmitted to our clinic last week. 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 Bridgett Larsson. I have reviewed Dr. Lianne Moris notes from March/23/18. Doppler ABI on that date showed an ABI on the right of 0.48 and on the left of 0.63. Dorsalis pedis waveforms were monophasic bilaterally. There was no waveforms detected at the posterior tibial on the right, monophasic on the left. Dr. Lianne Moris comments were that this patient would have follow-up vascular studies in 3 months including ABIs and right lower extremity arterial duplex. She had an MRI in February that was negative for osteomyelitis but showed generalized edema in the foot. Last albumin I see was in January at 3.4 we have been using Santyl to the 4 wounds on the right leg the patient is noted today to have widespread edema well up towards her groin this is pitting 2-3+. 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 Normal ejection fraction. 11/20/16; patient has a follow-up appointment with Dr. Bridgett Larsson on April 23. She is still complaining of a lot of pain in the right foot and right leg. It is not clear to me that this is at all positional however I think it is clear claudication with minimal activity perhaps at rest. At our suggestion she is return to her primary physician's office tomorrow with regards to her pitting lower extremity bilateral edema that I reviewed in detail last week 11/27/16; the follow-up with Dr. Bridgett Larsson was actually on May 23 on April 23 as I stated in my note last week. N/A case all of her wounds seems somewhat smaller. The 2 on the right leg are definitely smaller. The areas on the dorsal right first toe, right third toe and the lateral part of the right fifth metatarsal head  all looks smaller but have tightly adherent surfaces. We have been using Santyl 12/04/16; follow-up with Dr. Bridgett Larsson on May 23. 2 small open wounds on the right leg continue to get smaller. The area on the right third total lateral aspect of the right fifth toe also look better. The remaining area on the dorsal first toe still has some depth to it. We have been using Santyl to the toes and collagen on the right 12/11/16; according to patient's husband the follow-up with Dr. Bridgett Larsson is not until July. All of the wounds on the right leg are measuring smaller. We have been using some combination of Prisma and Santyl although I think we can go to straight Prisma today. There  may have been some confusion with home health about the primary dressing orders here. 12/25/16; the patient has had some healing this week. The area on her right lateral fifth metatarsal head, right third toe are both healed lower right leg is healed. In the vein harvest site superiorly she has one superficial open area. On the dorsal aspect of her right great toe/MTP joint the wound is now divided into 2 however the proximal area is deep and there is palpable bone 01/01/17; still an open area in the middle of her original right surgical scar. The area on the right third toe and right lateral fifth metatarsal head remained closed. Problematic area on the dorsal aspect of the toe. Previous surgery in this area line 01/08/17 small open area on the original scar on her upper anterior leg although this is closing. X-ray I did of the right first toe did not show underlying bony abnormality. Still this area on the dorsal first toe probes to bone. We have been using [place], Orel [name]. ([id number]) 01/15/17; small open area in the original scar in the upper anterior leg is almost fully closed. She has 2 open areas over the dorsal aspect of the right first toe that probes to bone. Used and a form starting last week. Vigorous bone scraping that I  did last week showed few methicillin sensitive staph aureus. She is allergic to penicillin and sulfa drugs. I'm going to give her 2 weeks of doxycycline. She will need an MRI with contrast. 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 She continues on doxycycline for a bone scraping that showed methicillin sensitive staph aureus [allergic to penicillin and sulfa]. We have been using Endo form to the wound. 01/29/17; surprisingly her MRI did not show osteomyelitis. She continues on doxycycline for a bone scraping that showed methicillin sensitive staph aureus with allergies to penicillin and sulfa. We have been using Endo form to the wound. Unfortunately I cannot get a surface on this visit looks like it is able to support a healing state. Proximally there is still exposed bone. There is no overt soft tissue tenderness. Her MRI did show a previous fracture was surgery to this area but no hardware 02/12/17 on evaluation today patient appears to be doing well in regard to her lower extremity wound. She does have some mild discomfort but this is minimal. There's no evidence of infection. Her left great toe nail has somewhat been lifted up and there is a little bit of slight bleeding underneath but this is still firmly attached. Objective Constitutional Well-nourished and well-hydrated in no acute distress. Vitals Time Taken: 10:45 AM, Height: 64 in, Weight: 200 lbs, BMI: 34.3, Temperature: 97.5 F, Pulse: 76 bpm, Respiratory Rate: 16 breaths/min, Blood Pressure: 144/75 mmHg. Respiratory normal breathing without difficulty. clear to auscultation bilaterally. Cardiovascular regular rate and rhythm with normal S1, S2. Psychiatric this patient is able to make decisions and demonstrates good insight into disease process. Alert and Oriented x 3. pleasant and cooperative. General Notes: Going to defer any debridement today is  patient's wound bed appears to be fairly good in my opinion and I see no reason for her to undergo debridement. Integumentary (Hair, Skin) [name], Bryella [name]. ([id number]) Wound #10 status is Open. Original cause of wound was Gradually Appeared. The wound is located on the Left Toe Great. The wound measures 0.5cm length x 0.5cm width x 0.1cm depth;  0.196cm^2 area and 0.02cm^3 volume. There is Fat Layer (Subcutaneous Tissue) Exposed exposed. There is no tunneling or undermining noted. There is a small amount of serosanguineous drainage noted. The wound margin is flat and intact. There is large (67-100%) granulation within the wound bed. The periwound skin appearance did not exhibit: Callus, Crepitus, Excoriation, Induration, Rash, Scarring, Dry/Scaly, Maceration, Atrophie Blanche, Cyanosis, Ecchymosis, Hemosiderin Staining, Mottled, Pallor, Rubor, Erythema. Periwound temperature was noted as No Abnormality. Wound #7 status is Open. Original cause of wound was Gradually Appeared. The wound is located on the Right,Dorsal Metatarsal head first. The wound measures 2.5cm length x 2cm width x 1cm depth; 3.927cm^2 area and 3.927cm^3 volume. There is bone, muscle, and Fat Layer (Subcutaneous Tissue) Exposed exposed. There is no tunneling or undermining noted. There is a large amount of serous drainage noted. The wound margin is flat and intact. There is small (1-33%) pink, friable granulation within the wound bed. There is a medium (34-66%) amount of necrotic tissue within the wound bed including Adherent Slough. The periwound skin appearance exhibited: Induration, Scarring, Ecchymosis, Hemosiderin Staining, Mottled. The periwound skin appearance did not exhibit: Maceration. Periwound temperature was noted as No Abnormality. The periwound has tenderness on palpation. 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 Cleanse wound with mild soap and water Anesthetic: Wound #7 Right,Dorsal Metatarsal head first: Topical Lidocaine 4% cream applied to wound bed prior to debridement Primary Wound Dressing: [place], Raul [name]. ([id number]) Wound #10 Left Toe Great: Other: - Triple antibiotic Wound #7 Right,Dorsal Metatarsal head first: Iodoflex Secondary Dressing: Wound #10 Left Toe Great: Dry Gauze Conform/Kerlix Wound #7 Right,Dorsal Metatarsal head first: Dry Gauze Conform/Kerlix Dressing Change Frequency: Wound #7 Right,Dorsal Metatarsal head first: Change dressing every other day. Follow-up Appointments: Wound #10 Left Toe Great: Return Appointment in 1 week. Wound #7 Right,Dorsal Metatarsal head first: Return Appointment in 1 week. Edema Control: Wound #10 Left Toe Great: Elevate legs to the level of the heart and pump ankles as often as possible Wound #7 Right,Dorsal Metatarsal head first: Elevate legs to the level of the heart and pump ankles as often as possible Additional Orders / Instructions: Wound #7 Right,Dorsal Metatarsal head first: Increase protein intake. Activity as tolerated Home Health: Wound #10 Left Toe Great: Continue Home Health Visits - [facility] weekly, [facility] Nurse may visit [facility] to address patient s wound care needs. FACE TO FACE ENCOUNTER: MEDICARE and MEDICAID PATIENTS: I certify that this patient  is under my care and that I had a face-to-face encounter that meets the physician face-to-face encounter requirements with this patient on this date. The encounter with the patient was in whole or in part for the following MEDICAL CONDITION: (primary reason for [place]) MEDICAL NECESSITY: I certify, that based on my findings, NURSING services are a medically necessary home health service. HOME BOUND STATUS: I certify that my clinical findings support that this patient is homebound (i.e., Due  to illness or injury, pt requires aid of supportive devices such as crutches, cane, wheelchairs, walkers, the use of special transportation or the assistance of another person to leave their place of residence. There is a normal inability to leave the home and doing so requires considerable and taxing effort. Other absences are for medical reasons / religious services and are infrequent or of short duration when for other reasons). If current dressing causes regression in wound condition, may D/C ordered dressing product/s and apply Normal Saline Moist Dressing daily until next [place] / Other MD appointment. [place] of regression in wound condition at [phone]. Please direct any NON-WOUND related issues/requests for orders to patient's Primary Care Physician Wound #7 Right,Dorsal Metatarsal head first: [place] Visits - [facility] twice weekly, [facility] Nurse may visit [facility] to address patient s wound care needs. FACE TO FACE ENCOUNTER: MEDICARE and MEDICAID PATIENTS: I certify that this patient is under my care and that I had a face-to-face encounter that meets the physician face-to-face encounter [place], RANA HOCHSTEIN. ([id number]) requirements with this patient on this date. The encounter with the patient was in whole or in part for the following MEDICAL CONDITION: (primary reason for [place]) MEDICAL NECESSITY: I  certify, that based on my findings, NURSING services are a medically necessary home health service. HOME BOUND STATUS: I certify that my clinical findings support that this patient is homebound (i.e., Due to illness or injury, pt requires aid of supportive devices such as crutches, cane, wheelchairs, walkers, the use of special transportation or the assistance of another person to leave their place of residence. There is a normal inability to leave the home and doing so requires considerable and taxing effort. Other absences are for medical reasons / religious services and are infrequent or of short duration when for other reasons). If current dressing causes regression in wound condition, may D/C ordered dressing product/s and apply Normal Saline Moist Dressing daily until next [place] / Other MD appointment. [place] of regression in wound condition at [phone]. Please direct any NON-WOUND related issues/requests for orders to patient's Primary Care Physician General Notes: Patient's wounds appeared to be doing well on evaluation today. We are gonna continue with the Current wound care measures per above. If anything worsens in the interim she will contact our office for additional recommendations. Electronic Signature(s) Signed: 02/13/2017 10:10:39 AM By: Worthy Keeler PA-C Entered By: Worthy Keeler on 02/12/2017 17:47:57 Pensinger, Tabitha [name]. ([id number]) -------------------------------------------------------------------------------- SuperBill Details Patient Name: Taketa, Jonay [name]. Date of Service: 02/12/2017 Medical Record Number: [id number] Patient Account Number: [id number] Date of Birth/Sex: [birth year] (76 y.o. 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 I70.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 of foot Facility Procedures CPT4 Code: [id number] Description: 38250 - WOUND CARE VISIT-LEV 4 EST PT Modifier: Quantity: 1 Physician Procedures CPT4: Description Modifier Quantity Code [id number] -  WC PHYS LEVEL 3 - EST PT 1 ICD-10 Description Diagnosis E11.622 Type 2 diabetes mellitus with other skin ulcer E11.621 Type 2 diabetes mellitus with foot ulcer L97.519 Non-pressure chronic ulcer of  other part of right foot with unspecified severity 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

## 2017-02-14 NOTE — Progress Notes (Signed)
NADALEE, NEISWENDER ([id number]) Visit Report for 02/12/2017 Arrival Information Details Patient Name: [name] [name] CAPP. Date of Service: 02/12/2017 10:15 AM Medical Record Number: [id number] Patient Account Number: [id number] Date of Birth/Sex: 05-23-1941 (76 y.o. Female) Treating RN: Afful, RN, BSN, Velva Harman Primary Care Milley Vining: Beverlyn Roux Other Clinician: Referring Zyire Eidson: Beverlyn Roux Treating Kilee Hedding/Extender: Melburn Hake, HOYT Weeks in Treatment: 14 Visit Information History Since Last Visit All ordered tests and consults were completed: No Patient Arrived: Wheel Chair Added or deleted any medications: No Arrival Time: 10:33 Any new allergies or adverse reactions: No Accompanied By: hubby Had a fall or experienced change in No activities of daily living that may affect Transfer Assistance: Manual risk of falls: Patient Identification Verified: Yes Signs or symptoms of abuse/neglect since last No Secondary Verification Process Yes visito Completed: Hospitalized since last visit: No Patient Requires Transmission-Based No Has Dressing in Place as Prescribed: Yes Precautions: Pain Present Now: Yes Patient Has Alerts: No Electronic Signature(s) Signed: 02/12/2017 4:42:39 PM By: Regan Lemming BSN, RN Entered By: Regan Lemming on 02/12/2017 10:34:01 [name] [name] ([id number]) -------------------------------------------------------------------------------- Clinic Level of Care Assessment Details Patient Name: [name] [name] V. Date of Service: 02/12/2017 10:15 AM Medical Record Number: [id number] Patient Account Number: [id number] Date of Birth/Sex: [birth year] (76 y.o. Female) Treating RN: Afful, RN, BSN, [employer] Primary Care Hussain Maimone: Beverlyn Roux Other Clinician: Referring Eri Mcevers: Beverlyn Roux Treating Velmer Broadfoot/Extender: Melburn Hake, HOYT Weeks in Treatment: 14 Clinic Level of Care Assessment Items TOOL 4 Quantity Score []  - Use when only an EandM is performed on FOLLOW-UP visit  0 ASSESSMENTS - Nursing Assessment / Reassessment X - Reassessment of Co-morbidities (includes updates in patient status) 1 10 X - Reassessment of Adherence to Treatment Plan 1 5 ASSESSMENTS - Wound and Skin Assessment / Reassessment []  - Simple Wound Assessment / Reassessment - one wound 0 X - Complex Wound Assessment / Reassessment - multiple wounds 2 5 []  - Dermatologic / Skin Assessment (not related to wound area) 0 ASSESSMENTS - Focused Assessment []  - Circumferential Edema Measurements - multi extremities 0 []  - Nutritional Assessment / Counseling / Intervention 0 X - Lower Extremity Assessment (monofilament, tuning fork, pulses) 1 5 []  - Peripheral Arterial Disease Assessment (using hand held doppler) 0 ASSESSMENTS - Ostomy and/or Continence Assessment and Care []  - Incontinence Assessment and Management 0 []  - Ostomy Care Assessment and Management (repouching, etc.) 0 PROCESS - Coordination of Care X - Simple Patient / Family Education for ongoing care 1 15 []  - Complex (extensive) Patient / Family Education for ongoing care 0 X - Staff obtains Programmer, systems, Records, Test Results / Process Orders 1 10 X - Staff telephones HHA, Nursing Homes / Clarify orders / etc 1 10 []  - Routine Transfer to another Facility (non-emergent condition) 0 [name] [name] V. ([id number]) []  - Routine Hospital Admission (non-emergent condition) 0 []  - New Admissions / Biomedical engineer / Ordering NPWT, Apligraf, etc. 0 []  - Emergency Hospital Admission (emergent condition) 0 []  - Simple Discharge Coordination 0 []  - Complex (extensive) Discharge Coordination 0 PROCESS - Special Needs []  - Pediatric / Minor Patient Management 0 []  - Isolation Patient Management 0 []  - Hearing / Language / Visual special needs 0 []  - Assessment of Community assistance (transportation, D/[name] planning, etc.) 0 []  - Additional assistance / Altered mentation 0 []  - Support Surface(s) Assessment (bed, cushion, seat, etc.)  0 INTERVENTIONS - Wound Cleansing / Measurement []  - Simple Wound Cleansing - one wound 0 X - Complex Wound Cleansing -  multiple wounds 2 5 X - Wound Imaging (photographs - any number of wounds) 1 5 []  - Wound Tracing (instead of photographs) 0 []  - Simple Wound Measurement - one wound 0 X - Complex Wound Measurement - multiple wounds 2 5 INTERVENTIONS - Wound Dressings []  - Small Wound Dressing one or multiple wounds 0 X - Medium Wound Dressing one or multiple wounds 2 15 []  - Large Wound Dressing one or multiple wounds 0 []  - Application of Medications - topical 0 []  - Application of Medications - injection 0 INTERVENTIONS - Miscellaneous []  - External ear exam 0 Partch, Dorsey V. ([id number]) []  - Specimen Collection (cultures, biopsies, blood, body fluids, etc.) 0 []  - Specimen(s) / Culture(s) sent or taken to Lab for analysis 0 X - Patient Transfer (multiple staff / Harrel Lemon Lift / Similar devices) 1 10 []  - Simple Staple / Suture removal (25 or less) 0 []  - Complex Staple / Suture removal (26 or more) 0 []  - Hypo / Hyperglycemic Management (close monitor of Blood Glucose) 0 []  - Ankle / Brachial Index (ABI) - do not check if billed separately 0 X - Vital Signs 1 5 Has the patient been seen at the hospital within the last three years: Yes Total Score: 135 Level Of Care: New/Established - Level 4 Electronic Signature(s) Signed: 02/12/2017 4:42:39 PM By: Regan Lemming BSN, RN Entered By: Regan Lemming on 02/12/2017 11:45:58 [name] [name] ([id number]) -------------------------------------------------------------------------------- Encounter Discharge Information Details Patient Name: [name] [name] V. Date of Service: 02/12/2017 10:15 AM Medical Record Number: [id number] Patient Account Number: [id number] Date of Birth/Sex: [birth year] (76 y.o. Female) Treating RN: Afful, RN, BSN, Velva Harman Primary Care Delanee Xin: Beverlyn Roux Other Clinician: Referring Aceson Labell: Beverlyn Roux Treating  Raylan Troiani/Extender: Melburn Hake, HOYT Weeks in Treatment: 14 Encounter Discharge Information Items Discharge Pain Level: 0 Discharge Condition: Stable Ambulatory Status: Wheelchair Discharge Destination: Home Transportation: Other Accompanied By: hubby Schedule Follow-up Appointment: No Medication Reconciliation completed and provided to Patient/Care No Micharl Helmes: Provided on Clinical Summary of Care: 02/12/2017 Form Type Recipient Paper Patient [facility] Electronic Signature(s) Signed: 02/12/2017 4:42:39 PM By: Regan Lemming BSN, RN Previous Signature: 02/12/2017 11:46:25 AM Version By: Ruthine Dose Entered By: Regan Lemming on 02/12/2017 11:47:03 [name] [name] V. ([id number]) -------------------------------------------------------------------------------- Lower Extremity Assessment Details Patient Name: [name] [name] V. Date of Service: 02/12/2017 10:15 AM Medical Record Number: [id number] Patient Account Number: [id number] Date of Birth/Sex: 31-Aug-1940 (75 y.o. Female) Treating RN: Afful, RN, BSN, Velva Harman Primary Care Blayne Garlick: Beverlyn Roux Other Clinician: Referring Marice Angelino: Beverlyn Roux Treating Calloway Andrus/Extender: Melburn Hake, HOYT Weeks in Treatment: 14 Vascular Assessment Claudication: Claudication Assessment [Right:None] Pulses: Dorsalis Pedis Palpable: [Right:Yes] Posterior Tibial Extremity colors, hair growth, and conditions: Extremity Color: [Right:Mottled] Hair Growth on Extremity: [Right:Yes] Temperature of Extremity: [Right:Warm] Capillary Refill: [Right:< 3 seconds] Toe Nail Assessment Left: Right: Thick: Yes Discolored: Yes Deformed: No Improper Length and Hygiene: No Electronic Signature(s) Signed: 02/12/2017 4:42:39 PM By: Regan Lemming BSN, RN Entered By: Regan Lemming on 02/12/2017 10:34:57 Casto, Bryana V. ([id number]) -------------------------------------------------------------------------------- Multi Wound Chart Details Patient Name: [name] [name] V. Date of Service:  02/12/2017 10:15 AM Medical Record Number: [id number] Patient Account Number: [id number] Date of Birth/Sex: [birth year] (76 y.o. Female) Treating RN: Afful, RN, BSN, Velva Harman Primary Care Taisha Pennebaker: Beverlyn Roux Other Clinician: Referring Kynlee Koenigsberg: Beverlyn Roux Treating Laiylah Roettger/Extender: STONE III, HOYT Weeks in Treatment: 14 Vital Signs Height(in): 64 Pulse(bpm): 76 Weight(lbs): 200 Blood Pressure 144/75 (mmHg): Body Mass Index(BMI): 34 Temperature(F): 97.5 Respiratory Rate 16 (breaths/min): Photos: [10:No Photos] [7:No Photos] [N/A:N/A]  Wound Location: [10:Left Toe Great] [7:Right Metatarsal head first N/A - Dorsal] Wounding Event: [10:Gradually Appeared] [7:Gradually Appeared] [N/A:N/A] 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] [7:the Lower Extremity] Comorbid History: [10:Cataracts, Chronic sinus Cataracts, Chronic sinus N/A problems/congestion, Congestive Heart Failure, Congestive Heart Failure, Hypertension, Type II Diabetes] [7:problems/congestion, Hypertension, Type II Diabetes] Date Acquired: [10:02/04/2017] [7:08/06/2013] [N/A:N/A] Weeks of Treatment: [10:0] [7:14] [N/A:N/A] Wound Status: [10:Open] [7:Open] [N/A:N/A] Measurements L x W x D 0.5x0.5x0.1 [7:2.5x2x1] [N/A:N/A] (cm) Area (cm) : [10:0.196] [7:3.927] [N/A:N/A] Volume (cm) : [10:0.02] [7:3.927] [N/A:N/A] % Reduction in Area: [10:N/A] [7:-233.40%] [N/A:N/A] % Reduction in Volume: N/A [7:-1012.50%] [N/A:N/A] Classification: [10:Grade 1] [7:Grade 2] [N/A:N/A] Exudate Amount: [10:Small] [7:Large] [N/A:N/A] Exudate Type: [10:Serosanguineous] [7:Serous] [N/A:N/A] Exudate Color: [10:red, brown] [7:amber] [N/A:N/A] Wound Margin: [10:Flat and Intact] [7:Flat and Intact] [N/A:N/A] Granulation Amount: [10:Large (67-100%)] [7:Small (1-33%)] [N/A:N/A] Granulation Quality: [10:N/A] [7:Pink, Friable] [N/A:N/A] Necrotic Amount: [10:N/A] [7:Medium (34-66%)] [N/A:N/A] Exposed  Structures: [10:Fat Layer (Subcutaneous Fat Layer (Subcutaneous N/A Tissue) Exposed: Yes] [7:Tissue) Exposed: Yes] Muscle: Yes Bone: Yes Fascia: No Tendon: No Joint: No Epithelialization: None Small (1-33%) N/A Periwound Skin Texture: Excoriation: No Induration: Yes N/A Induration: No Scarring: Yes Callus: No Crepitus: No Rash: No Scarring: No Periwound Skin Maceration: No Maceration: No N/A Moisture: Dry/Scaly: No Periwound Skin Color: Atrophie Blanche: No Ecchymosis: Yes N/A Cyanosis: No Hemosiderin Staining: Yes Ecchymosis: No Mottled: Yes Erythema: No Hemosiderin Staining: No Mottled: No Pallor: No Rubor: No Temperature: No Abnormality No Abnormality N/A Tenderness on No Yes N/A Palpation: Wound Preparation: Ulcer Cleansing: Ulcer Cleansing: N/A Rinsed/Irrigated with Rinsed/Irrigated with Saline Saline, Other: soap and water Topical Anesthetic Applied: None Topical Anesthetic Applied: Other: lidociane 4% Treatment Notes Electronic Signature(s) Signed: 02/12/2017 4:42:39 PM By: Regan Lemming BSN, RN Entered By: Regan Lemming on 02/12/2017 11:35:11 [name] [name] ([id number]) -------------------------------------------------------------------------------- [place] Details Patient Name: [name] [name] V. Date of Service: 02/12/2017 10:15 AM Medical Record Number: [id number] Patient Account Number: [id number] Date of Birth/Sex: [birth year] (76 y.o. Female) Treating RN: Afful, RN, BSN, Velva Harman Primary Care Sahib Pella: Beverlyn Roux Other Clinician: Referring Taria Castrillo: Beverlyn Roux Treating Blia Totman/Extender: Melburn Hake, HOYT Weeks in Treatment: 14 Active Inactive ` Abuse / Safety / Falls / Self Care Management Nursing Diagnoses: Impaired physical mobility Potential for falls Goals: Patient will remain injury free Date Initiated: 11/06/2016 Target Resolution Date: 12/30/2016 Goal Status: Active Patient/caregiver will verbalize understanding of skin  care regimen Date Initiated: 11/06/2016 Target Resolution Date: 12/30/2016 Goal Status: Active Interventions: Assess fall risk on admission and as needed Treatment Activities: Patient referred to home care : 11/06/2016 Notes: ` Nutrition Nursing Diagnoses: Potential for alteratiion in Nutrition/Potential for imbalanced nutrition Goals: Patient/caregiver verbalizes understanding of need to maintain therapeutic glucose control per primary care physician Date Initiated: 11/06/2016 Target Resolution Date: 12/30/2016 Goal Status: Active Interventions: Provide education on elevated blood sugars and impact on wound healing Butsch, Keir V. ([id number]) Notes: ` Orientation to the Wound Care Program Nursing Diagnoses: Knowledge deficit related to the wound healing center program Goals: Patient/caregiver will verbalize understanding of the [place] Date Initiated: 11/06/2016 Target Resolution Date: 12/30/2016 Goal Status: Active Interventions: Provide education on orientation to the wound center Notes: ` Venous Leg Ulcer Nursing Diagnoses: Actual venous Insuffiency (use after diagnosis is confirmed) Knowledge deficit related to disease process and management Goals: Non-invasive venous studies are completed as ordered Date Initiated: 11/06/2016 Target Resolution Date: 12/30/2016 Goal Status: Active Patient/caregiver will verbalize understanding of disease process and disease management Date Initiated:  11/06/2016 Target Resolution Date: 12/30/2016 Goal Status: Active Interventions: Assess peripheral edema status every visit. Notes: ` Wound/Skin Impairment Nursing Diagnoses: Impaired tissue integrity Knowledge deficit related to smoking impact on wound healing Knowledge deficit related to ulceration/compromised skin integrity Goals: Melching, Amarissa V. ([id number]) Ulcer/skin breakdown will heal within 14 weeks Date Initiated: 11/06/2016 Target Resolution Date:  02/05/2017 Goal Status: Active Interventions: Assess ulceration(s) every visit Treatment Activities: Skin care regimen initiated : 11/06/2016 Notes: Electronic Signature(s) Signed: 02/12/2017 4:42:39 PM By: Regan Lemming BSN, RN Entered By: Regan Lemming on 02/12/2017 11:34:43 [place]p, Jocelyne V. ([id number]) -------------------------------------------------------------------------------- Pain Assessment Details Patient Name: [name] [name] V. Date of Service: 02/12/2017 10:15 AM Medical Record Number: [id number] Patient Account Number: [id number] Date of Birth/Sex: 02-02-1941 (76 y.o. Female) Treating RN: Afful, RN, BSN, Velva Harman Primary Care Kalyn Dimattia: Beverlyn Roux Other Clinician: Referring Jeilani Grupe: Beverlyn Roux Treating Rehana Uncapher/Extender: Melburn Hake, HOYT Weeks in Treatment: 14 Active Problems Location of Pain Severity and Description of Pain Patient Has Paino Yes Site Locations Pain Location: Pain in Ulcers Rate the pain. Current Pain Level: 4 Character of Pain Describe the Pain: Tender Pain Management and Medication Current Pain Management: Electronic Signature(s) Signed: 02/12/2017 4:42:39 PM By: Regan Lemming BSN, RN Entered By: Regan Lemming on 02/12/2017 10:34:20 Campus, Krissy [name] ([id number]) -------------------------------------------------------------------------------- Patient/Caregiver Education Details Patient Name: Claxton, Champayne V. Date of Service: 02/12/2017 10:15 AM Medical Record Number: [id number] Patient Account Number: [id number] Date of Birth/Gender: 07-Sep-1940 (76 y.o. Female) Treating RN: Afful, RN, BSN, Velva Harman Primary Care Physician: Beverlyn Roux Other Clinician: Referring Physician: Beverlyn Roux Treating Physician/Extender: Sharalyn Ink in Treatment: 14 Education Assessment Education Provided To: Patient and Caregiver hubby/hh Education Topics Provided Elevated Blood Sugar/ Impact on Healing: Methods: Explain/Verbal Responses: Reinforcements needed, State content  correctly Welcome [facility]: Methods: Explain/Verbal Responses: Reinforcements needed, State content correctly Wound/Skin Impairment: Methods: Explain/Verbal Responses: Reinforcements needed Electronic Signature(s) Signed: 02/12/2017 4:42:39 PM By: Regan Lemming BSN, RN Entered By: Regan Lemming on 02/12/2017 11:47:27 Harnden, Naje V. ([id number]) -------------------------------------------------------------------------------- Wound Assessment Details Patient Name: Melgarejo, Sherrin V. Date of Service: 02/12/2017 10:15 AM Medical Record Number: [id number] Patient Account Number: [id number] Date of Birth/Sex: [birth year] (76 y.o. Female) Treating RN: Afful, RN, BSN, [employer] Primary Care Kishawn Pickar: Beverlyn Roux Other Clinician: Referring Lakeasha Petion: Beverlyn Roux Treating Deklyn Gibbon/Extender: STONE III, HOYT Weeks in Treatment: 14 Wound Status Wound Number: 10 Primary Diabetic Wound/Ulcer of the Lower Etiology: Extremity Wound Location: Left Toe Great Wound Open Wounding Event: Gradually Appeared Status: Date Acquired: 02/04/2017 Comorbid Cataracts, Chronic sinus Weeks Of Treatment: 0 History: problems/congestion, Congestive Heart Clustered Wound: No Failure, Hypertension, Type II Diabetes Photos Photo Uploaded By: Regan Lemming on 02/12/2017 17:12:56 Wound Measurements Length: (cm) 0.5 Width: (cm) 0.5 Depth: (cm) 0.1 Area: (cm) 0.196 Volume: (cm) 0.02 % Reduction in Area: % Reduction in Volume: Epithelialization: None Tunneling: No Undermining: No Wound Description Classification: Grade 1 Foul Odor Aft Wound Margin: Flat and Intact Slough/Fibrin Exudate Amount: Small Exudate Type: Serosanguineous Hackenberg, Beau V. ([id number]) er Cleansing: No o No Exudate Color: red, brown Wound Bed Granulation Amount: Large (67-100%) Exposed Structure Fat Layer (Subcutaneous Tissue) Exposed: Yes Periwound Skin Texture Texture Color No Abnormalities Noted: No No Abnormalities Noted:  No Callus: No Atrophie Blanche: No Crepitus: No Cyanosis: No Excoriation: No Ecchymosis: No Induration: No Erythema: No Rash: No Hemosiderin Staining: No Scarring: No Mottled: No Pallor: No Moisture Rubor: No No Abnormalities Noted: No Dry / Scaly: No Temperature / Pain Maceration: No Temperature: No Abnormality Wound Preparation Ulcer  Cleansing: Rinsed/Irrigated with Saline Topical Anesthetic Applied: None Treatment Notes Wound #10 (Left Toe Great) 1. Cleansed with: Clean wound with Normal Saline 4. Dressing Applied: Iodoflex 5. Secondary Dressing Applied Dry Gauze Kerlix/Conform 7. Secured with Tape Notes triple abt on left toe Electronic Signature(s) Signed: 02/12/2017 4:42:39 PM By: Regan Lemming BSN, RN Entered By: Regan Lemming on 02/12/2017 10:45:03 Prien, Somara V. ([id number]) -------------------------------------------------------------------------------- Wound Assessment Details Patient Name: Rabenold, Kathrina V. Date of Service: 02/12/2017 10:15 AM Medical Record Number: [id number] Patient Account Number: [id number] Date of Birth/Sex: Jan 12, 1941 (76 y.o. Female) Treating RN: Afful, RN, BSN, Administrator, sports Primary Care Zarah Carbon: Beverlyn Roux Other Clinician: Referring Riann Oman: Beverlyn Roux Treating Onesimo Lingard/Extender: Melburn Hake, HOYT Weeks in Treatment: 14 Wound Status Wound Number: 7 Primary Diabetic Wound/Ulcer of the Lower Etiology: Extremity Wound Location: Right Metatarsal head first - Dorsal Wound Open Status: Wounding Event: Gradually Appeared Comorbid Cataracts, Chronic sinus Date Acquired: 08/06/2013 History: problems/congestion, Congestive Heart Weeks Of Treatment: 14 Failure, Hypertension, Type II Diabetes Clustered Wound: No Photos Photo Uploaded By: Regan Lemming on 02/12/2017 17:12:57 Wound Measurements Length: (cm) 2.5 Width: (cm) 2 Depth: (cm) 1 Area: (cm) 3.927 Volume: (cm) 3.927 % Reduction in Area: -233.4% % Reduction in Volume:  -1012.5% Epithelialization: Small (1-33%) Tunneling: No Undermining: No Wound Description Classification: Grade 2 Wound Margin: Flat and Intact Exudate Amount: Large Exudate Type: Serous Exudate Color: amber Foul Odor After Cleansing: No Slough/Fibrino Yes Wound Bed Granulation Amount: Small (1-33%) Exposed Structure Granulation Quality: Pink, Friable Fascia Exposed: No Necrotic Amount: Medium (34-66%) Fat Layer (Subcutaneous Tissue) Exposed: Yes [name] [name] V. ([id number]) Necrotic Quality: Adherent Slough Tendon Exposed: No Muscle Exposed: Yes Necrosis of Muscle: No Joint Exposed: No Bone Exposed: Yes Periwound Skin Texture Texture Color No Abnormalities Noted: No No Abnormalities Noted: No Induration: Yes Ecchymosis: Yes Scarring: Yes Hemosiderin Staining: Yes Mottled: Yes Moisture No Abnormalities Noted: No Temperature / Pain Maceration: No Temperature: No Abnormality Tenderness on Palpation: Yes Wound Preparation Ulcer Cleansing: Rinsed/Irrigated with Saline, Other: soap and water, Topical Anesthetic Applied: Other: lidociane 4%, Treatment Notes Wound #7 (Right, Dorsal Metatarsal head first) 1. Cleansed with: Clean wound with Normal Saline 4. Dressing Applied: Iodoflex 5. Secondary Dressing Applied Dry Gauze Kerlix/Conform 7. Secured with Tape Notes triple abt on left toe Electronic Signature(s) Signed: 02/12/2017 4:42:39 PM By: Regan Lemming BSN, RN Entered By: Regan Lemming on 02/12/2017 10:42:05 Ricketson, Onelia [name] ([id number]) -------------------------------------------------------------------------------- Vitals Details Patient Name: Dowis, Karne V. Date of Service: 02/12/2017 10:15 AM Medical Record Number: [id number] Patient Account Number: [id number] Date of Birth/Sex: 1941/01/25 (76 y.o. Female) Treating RN: Afful, RN, BSN, Velva Harman Primary Care Karlton Maya: Beverlyn Roux Other Clinician: Referring Levell Tavano: Beverlyn Roux Treating Robinette Esters/Extender: Melburn Hake, HOYT Weeks in Treatment: 14 Vital Signs Time Taken: 10:45 Temperature (F): 97.5 Height (in): 64 Pulse (bpm): 76 Weight (lbs): 200 Respiratory Rate (breaths/min): 16 Body Mass Index (BMI): 34.3 Blood Pressure (mmHg): 144/75 Reference Range: 80 - 120 mg / dl Electronic Signature(s) Signed: 02/12/2017 4:42:39 PM By: Regan Lemming BSN, RN Entered By: Regan Lemming on 02/12/2017 10:45:54

## 2017-02-19 ENCOUNTER — Encounter: Payer: Medicare HMO | Admitting: Internal Medicine

## 2017-02-19 DIAGNOSIS — E11622 Type 2 diabetes mellitus with other skin ulcer: Secondary | ICD-10-CM | POA: Diagnosis not present

## 2017-02-21 ENCOUNTER — Ambulatory Visit: Payer: Medicare HMO | Admitting: Vascular Surgery

## 2017-02-21 ENCOUNTER — Encounter (HOSPITAL_COMMUNITY): Payer: Medicare HMO

## 2017-02-21 NOTE — Progress Notes (Signed)
CIRA, DEYOE ([id number]) Visit Report for 02/19/2017 Arrival Information Details Patient Name: [name], [name] Date of Service: 02/19/2017 10:15 AM Medical Record Number: [id number] Patient Account Number: [id number] Date of Birth/Sex: [birth year] (76 y.o. Female) Treating RN: Afful, RN, BSN, Velva Harman Primary Care Rubbie Goostree: Beverlyn Roux Other Clinician: Referring Ngina Royer: Beverlyn Roux Treating Cristan Hout/Extender: Tito Dine in Treatment: 15 Visit Information History Since Last Visit All ordered tests and consults were completed: No Patient Arrived: Wheel Chair Added or deleted any medications: No Arrival Time: 11:02 Any new allergies or adverse reactions: No Accompanied By: Micheline Rough Had a fall or experienced change in No activities of daily living that may affect Transfer Assistance: None risk of falls: Patient Identification Verified: Yes Signs or symptoms of abuse/neglect since last No Secondary Verification Process Yes visito Completed: Hospitalized since last visit: No Patient Requires Transmission-Based No Has Dressing in Place as Prescribed: Yes Precautions: Pain Present Now: No Patient Has Alerts: No Electronic Signature(s) Signed: 02/19/2017 6:10:04 PM By: Regan Lemming BSN, RN Entered By: Regan Lemming on 02/19/2017 11:02:51 [place], [name] ([id number]) -------------------------------------------------------------------------------- Encounter Discharge Information Details Patient Name: [name], [name] Date of Service: 02/19/2017 10:15 AM Medical Record Number: [id number] Patient Account Number: [id number] Date of Birth/Sex: Oct 23, 1940 (76 y.o. Female) Treating RN: Afful, RN, BSN, Velva Harman Primary Care Enez Monahan: Beverlyn Roux Other Clinician: Referring Evianna Chandran: Beverlyn Roux Treating Narcisa Ganesh/Extender: Tito Dine in Treatment: 15 Encounter Discharge Information Items Discharge Pain Level: 0 Discharge Condition: Stable Ambulatory Status:  Wheelchair Discharge Destination: Home Transportation: Private Auto Accompanied By: Micheline Rough Schedule Follow-up Appointment: No Medication Reconciliation completed and provided to Patient/Care No Brihana Quickel: Provided on Clinical Summary of Care: 02/19/2017 Form Type Recipient Paper Patient [facility] Electronic Signature(s) Signed: 02/19/2017 11:34:46 AM By: Ruthine Dose Entered By: Ruthine Dose on 02/19/2017 11:34:45 [name], [name] ([id number]) -------------------------------------------------------------------------------- Lower Extremity Assessment Details Patient Name: [name], [name] Date of Service: 02/19/2017 10:15 AM Medical Record Number: [id number] Patient Account Number: [id number] Date of Birth/Sex: [birth year] (76 y.o. Female) Treating RN: Afful, RN, BSN, Velva Harman Primary Care Delorse Shane: Beverlyn Roux Other Clinician: Referring Yzabelle Calles: Beverlyn Roux Treating Nuri Larmer/Extender: Tito Dine in Treatment: 15 Vascular Assessment Claudication: Claudication Assessment [Left:None] [Right:None] Pulses: Dorsalis Pedis Doppler Audible: [Left:Yes] [Right:Yes] Posterior Tibial Extremity colors, hair growth, and conditions: Extremity Color: [Left:Mottled] [Right:Mottled] Hair Growth on Extremity: [Left:No] [Right:No] Temperature of Extremity: [Left:Warm] [Right:Warm] Capillary Refill: [Left:< 3 seconds] [Right:< 3 seconds] Toe Nail Assessment Left: Right: Thick: Yes Yes Discolored: Yes Yes Deformed: No No Improper Length and Hygiene: No No Electronic Signature(s) Signed: 02/19/2017 6:10:04 PM By: Regan Lemming BSN, RN Entered By: Regan Lemming on 02/19/2017 11:03:31 [name], [name] ([id number]) -------------------------------------------------------------------------------- Multi Wound Chart Details Patient Name: [name], [name] Date of Service: 02/19/2017 10:15 AM Medical Record Number: [id number] Patient Account Number: [id number] Date of Birth/Sex: 17-Jan-1941 (76 y.o.  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] [7:the Lower Extremity] Comorbid History: [10:Cataracts, Chronic sinus Cataracts, Chronic sinus N/A problems/congestion, Congestive Heart Failure, Congestive Heart Failure, Hypertension, Type II Diabetes] [7:problems/congestion, Hypertension, Type  II Diabetes] Date Acquired: [10:02/04/2017] [7:08/06/2013] [N/A:N/A] Weeks of Treatment: [10:1] [7:15] [N/A:N/A] Wound Status: [10:Open] [7:Open] [N/A:N/A] Measurements L x W x D 0.4x0.4x0.1 [7:2.2x1x1] [N/A:N/A] (cm) Area (cm) : [10:0.126] [7:1.728] [N/A:N/A] Volume (cm) : [10:0.013] [7:1.728] [N/A:N/A] % Reduction in Area: [10:35.70%] [7:-46.70%] [N/A:N/A] % Reduction in Volume: 35.00% [7:-389.50%] [N/A:N/A] Classification: [10:Grade 1] [7:Grade 2] [N/A:N/A] Exudate Amount: [10:Small] [7:Large] [N/A:N/A] Exudate Type: [10:Serosanguineous] [7:Serous] [N/A:N/A] Exudate Color: [10:red, brown] [7:amber] [N/A:N/A] Wound Margin: [10:Flat and Intact] [7:Flat and Intact] [N/A:N/A] Granulation Amount: [10:Large (67-100%)] [7:Small (1-33%)] [N/A:N/A] Granulation Quality: [10:Pink, Pale] [7:Pink, Friable] [N/A:N/A] Necrotic Amount: [10:None Present (0%)] [7:Medium (34-66%)] [N/A:N/A] Exposed Structures: [10:Fat Layer (Subcutaneous Fat Layer (Subcutaneous N/A Tissue) Exposed:  Yes] [7:Tissue) Exposed: Yes] Muscle: Yes Bone: Yes Fascia: No Tendon: No Joint: No Epithelialization: None Small (1-33%) N/A Debridement: N/A Debridement ([id number]- N/A 11047) Pre-procedure N/A 11:16 N/A Verification/Time Out Taken: Pain Control: N/A Lidocaine 4% Topical N/A Solution Tissue Debrided: N/A Fibrin/Slough, Fat, N/A Exudates, Subcutaneous Level: N/A Skin/Subcutaneous N/A Tissue Debridement Area (sq N/A 2.2 N/A cm): Instrument: N/A Curette N/A Bleeding: N/A Minimum N/A Hemostasis Achieved: N/A Pressure N/A Procedural Pain: N/A 0 N/A Post Procedural Pain: N/A 0 N/A Debridement Treatment N/A Procedure was tolerated N/A Response: well Post Debridement N/A 2.2x1x1 N/A Measurements L x W x D (cm) Post Debridement N/A 1.728 N/A Volume: (cm) Periwound Skin Texture: Excoriation: No Induration: Yes N/A Induration: No Scarring: Yes Callus: No Crepitus: No Rash: No Scarring: No Periwound Skin Maceration: No Maceration: No N/A Moisture: Dry/Scaly: No Periwound Skin Color: Atrophie Blanche: No Ecchymosis: Yes N/A Cyanosis: No Hemosiderin Staining: Yes Ecchymosis: No Mottled: Yes Erythema: No Hemosiderin Staining: No Mottled: No Pallor: No Rubor: No Temperature: No Abnormality No Abnormality N/A Tenderness on No Yes N/A Palpation: [name], [name] ([id number]) Wound Preparation: Ulcer Cleansing: Ulcer Cleansing: N/A Rinsed/Irrigated with Rinsed/Irrigated with Saline Saline, Other: soap and water Topical Anesthetic Applied: None Topical Anesthetic Applied: Other: lidociane 4% Procedures Performed: N/A Debridement N/A Treatment Notes Wound #10 (Left Toe Great) 1. Cleansed with: Clean wound with Normal Saline 4. Dressing Applied: Other dressing (specify in notes) 5. Secondary Dressing Applied Dry Gauze Kerlix/Conform 7. Secured with Tape Notes triple abt on left toe Wound #7 (Right, Dorsal Metatarsal head first) 1. Cleansed with: Clean wound  with Normal Saline 4. Dressing Applied: Other dressing (specify in notes) 5. Secondary Dressing Applied Dry Gauze Kerlix/Conform 7. Secured with Tape Notes triple abt on left toe Electronic Signature(s) Signed: 02/19/2017 4:47:30 PM By: Linton Ham MD Entered By: Linton Ham on 02/19/2017 12:34:56 [name], [name] ([id number]) -------------------------------------------------------------------------------- Multi-Disciplinary Care Plan Details Patient Name: Altamura, Bonni V. Date of Service: 02/19/2017 10:15 AM Medical Record Number: [id number] Patient Account Number: [id number] Date of Birth/Sex: 1940/12/31 (76 y.o. Female) Treating RN: Afful, RN, BSN, Velva Harman Primary Care Dakia Schifano: Beverlyn Roux Other Clinician: Referring Dysen Edmondson: Beverlyn Roux Treating Inga Noller/Extender: Tito Dine in Treatment: 15 Active Inactive ` Abuse / Safety / Falls / Self Care Management Nursing Diagnoses: Impaired physical mobility Potential for falls Goals: Patient will remain injury free Date Initiated: 11/06/2016 Target Resolution Date: 12/30/2016 Goal Status: Active Patient/caregiver will verbalize understanding of skin care regimen Date Initiated: 11/06/2016 Target Resolution Date: 12/30/2016 Goal Status: Active Interventions: Assess fall risk on admission and as needed Treatment Activities: Patient referred to home care : 11/06/2016 Notes: ` Nutrition Nursing Diagnoses: Potential for alteratiion in Nutrition/Potential for imbalanced nutrition Goals: Patient/caregiver verbalizes understanding of need to maintain therapeutic glucose control per primary care physician Date Initiated: 11/06/2016  Target Resolution Date: 12/30/2016 Goal Status: Active Interventions: Provide education on elevated blood sugars and impact on wound healing [name], [name] ([id number]) Notes: ` Orientation to the Wound Care Program Nursing Diagnoses: Knowledge deficit related to the wound healing center  program Goals: Patient/caregiver will verbalize understanding of the [place] Date Initiated: 11/06/2016 Target Resolution Date: 12/30/2016 Goal Status: Active Interventions: Provide education on orientation to the wound center Notes: ` Venous Leg Ulcer Nursing Diagnoses: Actual venous Insuffiency (use after diagnosis is confirmed) Knowledge deficit related to disease process and management Goals: Non-invasive venous studies are completed as ordered Date Initiated: 11/06/2016 Target Resolution Date: 12/30/2016 Goal Status: Active Patient/caregiver will verbalize understanding of disease process and disease management Date Initiated: 11/06/2016 Target Resolution Date: 12/30/2016 Goal Status: Active Interventions: Assess peripheral edema status every visit. Notes: ` Wound/Skin Impairment Nursing Diagnoses: Impaired tissue integrity Knowledge deficit related to smoking impact on wound healing Knowledge deficit related to ulceration/compromised skin integrity Goals: [name], [name] ([id number]) Ulcer/skin breakdown will heal within 14 weeks Date Initiated: 11/06/2016 Target Resolution Date: 02/05/2017 Goal Status: Active Interventions: Assess ulceration(s) every visit Treatment Activities: Skin care regimen initiated : 11/06/2016 Notes: Electronic Signature(s) Signed: 02/19/2017 6:10:04 PM By: Regan Lemming BSN, RN Entered By: Regan Lemming on 02/19/2017 11:16:08 [name], [name] ([id number]) -------------------------------------------------------------------------------- Pain Assessment Details Patient Name: Pumphrey, Mariavictoria V. Date of Service: 02/19/2017 10:15 AM Medical Record Number: [id number] Patient Account Number: [id number] Date of Birth/Sex: 07-22-1941 (76 y.o. Female) Treating RN: Afful, RN, BSN, Velva Harman Primary Care Leniyah Martell: Beverlyn Roux Other Clinician: Referring Undra Trembath: Beverlyn Roux Treating Makenzee Choudhry/Extender: Tito Dine in Treatment: 15 Active  Problems Location of Pain Severity and Description of Pain Patient Has Paino No Site Locations With Dressing Change: No Pain Management and Medication Current Pain Management: Electronic Signature(s) Signed: 02/19/2017 6:10:04 PM By: Regan Lemming BSN, RN Entered By: Regan Lemming on 02/19/2017 11:02:59 [name], [name] ([id number]) -------------------------------------------------------------------------------- Patient/Caregiver Education Details Patient Name: Goodspeed, Makesha V. Date of Service: 02/19/2017 10:15 AM Medical Record Patient Account Number: [id number] Number: Treating RN: Baruch Gouty, RN, BSN, Velva Harman 1940/11/[phone] y.o. Other Clinician: Date of Birth/Gender: Female) Treating ROBSON, [name] Primary Care Physician: Beverlyn Roux Physician/Extender: G Referring Physician: Dorthula Nettles in Treatment: 15 Education Assessment Education Provided To: Patient and Caregiver Education Topics Provided Elevated Blood Sugar/ Impact on Healing: Methods: Explain/Verbal Responses: Reinforcements needed Welcome To [facility]: Methods: Explain/Verbal Responses: Refused information, Reinforcements needed Electronic Signature(s) Signed: 02/19/2017 6:10:04 PM By: Regan Lemming BSN, RN Entered By: Regan Lemming on 02/19/2017 11:24:01 [name], [name] ([id number]) -------------------------------------------------------------------------------- Wound Assessment Details Patient Name: [name], [name] Date of Service: 02/19/2017 10:15 AM Medical Record Number: [id number] Patient Account Number: [id number] Date of Birth/Sex: 03-Oct-1940 (76 y.o. Female) Treating RN: Afful, RN, BSN, Administrator, sports Primary Care Nydia Ytuarte: Beverlyn Roux Other Clinician: Referring Mozelle Remlinger: Beverlyn Roux Treating Baileigh Modisette/Extender: Tito Dine in Treatment: 15 Wound Status Wound Number: 10 Primary Diabetic Wound/Ulcer of the Lower Etiology: Extremity Wound Location: Left Toe Great Wound Open Wounding  Event: Gradually Appeared Status: Date Acquired: 02/04/2017 Comorbid Cataracts, Chronic sinus Weeks Of Treatment: 1 History: problems/congestion, Congestive Heart Clustered Wound: No Failure, Hypertension, Type II Diabetes Photos Photo Uploaded By: Regan Lemming on 02/19/2017 18:23:49 Wound Measurements Length: (cm) 0.4 Width: (cm) 0.4 Depth: (cm) 0.1 Area: (cm) 0.126 Volume: (cm) 0.013 % Reduction in Area: 35.7% % Reduction in Volume: 35% Epithelialization: None Tunneling: No Undermining: No Wound Description Classification: Grade 1 Foul Odor Aft Wound Margin:  Flat and Intact Slough/Fibrin Exudate Amount: Small Exudate Type: Serosanguineous Exudate Color: red, brown er Cleansing: No o No Wound Bed Granulation Amount: Large (67-100%) Exposed Structure Granulation Quality: Pink, Pale Fat Layer (Subcutaneous Tissue) Exposed: Yes Necrotic Amount: None Present (0%) [name], [name] ([id number]) Periwound Skin Texture Texture Color No Abnormalities Noted: No No Abnormalities Noted: No Callus: No Atrophie Blanche: No Crepitus: No Cyanosis: No Excoriation: No Ecchymosis: No Induration: No Erythema: No Rash: No Hemosiderin Staining: No Scarring: No Mottled: No Pallor: No Moisture Rubor: No No Abnormalities Noted: No Dry / Scaly: No Temperature / Pain Maceration: No Temperature: No Abnormality Wound Preparation Ulcer Cleansing: Rinsed/Irrigated with Saline Topical Anesthetic Applied: None Treatment Notes Wound #10 (Left Toe Great) 1. Cleansed with: Clean wound with Normal Saline 4. Dressing Applied: Other dressing (specify in notes) 5. Secondary Dressing Applied Dry Gauze Kerlix/Conform 7. Secured with Tape Notes triple abt on left toe Electronic Signature(s) Signed: 02/19/2017 6:10:04 PM By: Regan Lemming BSN, RN Entered By: Regan Lemming on 02/19/2017 11:15:35 [name], [name]  ([id number]) -------------------------------------------------------------------------------- Wound Assessment Details Patient Name: Tuite, Kellyann V. Date of Service: 02/19/2017 10:15 AM Medical Record Number: [id number] Patient Account Number: [id number] Date of Birth/Sex: 10/10/1940 (76 y.o. Female) Treating RN: Afful, RN, BSN, Administrator, sports Primary Care Swayze Kozuch: Beverlyn Roux Other Clinician: Referring Suzzette Gasparro: Beverlyn Roux Treating Amonie Wisser/Extender: Tito Dine in Treatment: 15 Wound Status Wound Number: 7 Primary Diabetic Wound/Ulcer of the Lower Etiology: Extremity Wound Location: Right Metatarsal head first - Dorsal Wound Open Status: Wounding Event: Gradually Appeared Comorbid Cataracts, Chronic sinus Date Acquired: 08/06/2013 History: problems/congestion, Congestive Heart Weeks Of Treatment: 15 Failure, Hypertension, Type II Diabetes Clustered Wound: No Photos Photo Uploaded By: Regan Lemming on 02/19/2017 18:24:38 Wound Measurements Length: (cm) 2.2 Width: (cm) 1 Depth: (cm) 1 Area: (cm) 1.728 Volume: (cm) 1.728 % Reduction in Area: -46.7% % Reduction in Volume: -389.5% Epithelialization: Small (1-33%) Tunneling: No Undermining: No Wound Description Classification: Grade 2 Wound Margin: Flat and Intact Exudate Amount: Large Exudate Type: Serous Exudate Color: amber Foul Odor After Cleansing: No Slough/Fibrino Yes Wound Bed Granulation Amount: Small (1-33%) Exposed Structure Granulation Quality: Pink, Friable Fascia Exposed: No Necrotic Amount: Medium (34-66%) Fat Layer (Subcutaneous Tissue) Exposed: Yes Trzcinski, Dalyah V. ([id number]) Necrotic Quality: Adherent Slough Tendon Exposed: No Muscle Exposed: Yes Necrosis of Muscle: No Joint Exposed: No Bone Exposed: Yes Periwound Skin Texture Texture Color No Abnormalities Noted: No No Abnormalities Noted: No Induration: Yes Ecchymosis: Yes Scarring: Yes Hemosiderin Staining: Yes Mottled:  Yes Moisture No Abnormalities Noted: No Temperature / Pain Maceration: No Temperature: No Abnormality Tenderness on Palpation: Yes Wound Preparation Ulcer Cleansing: Rinsed/Irrigated with Saline, Other: soap and water, Topical Anesthetic Applied: Other: lidociane 4%, Treatment Notes Wound #7 (Right, Dorsal Metatarsal head first) 1. Cleansed with: Clean wound with Normal Saline 4. Dressing Applied: Other dressing (specify in notes) 5. Secondary Dressing Applied Dry Gauze Kerlix/Conform 7. Secured with Tape Notes triple abt on left toe Electronic Signature(s) Signed: 02/19/2017 6:10:04 PM By: Regan Lemming BSN, RN Entered By: Regan Lemming on 02/19/2017 11:15:54 Crew, Hero V. ([id number]) -------------------------------------------------------------------------------- Vitals Details Patient Name: Gabriel, Kinslie V. Date of Service: 02/19/2017 10:15 AM Medical Record Number: [id number] Patient Account Number: [id number] Date of Birth/Sex: September 01, 1940 (76 y.o. Female) Treating RN: Afful, RN, BSN, Velva Harman Primary Care Willard Madrigal: Beverlyn Roux Other Clinician: Referring Canyon Lohr: Beverlyn Roux Treating Quina Wilbourne/Extender: Tito Dine in Treatment: 15 Vital Signs Time Taken: 11:03 Temperature (F): 97.6 Height (in): 64 Pulse (bpm): 70 Weight (lbs): 200  Respiratory Rate (breaths/min): 16 Body Mass Index (BMI): 34.3 Blood Pressure (mmHg): 162/58 Reference Range: 80 - 120 mg / dl Electronic Signature(s) Signed: 02/19/2017 6:10:04 PM By: Regan Lemming BSN, RN Entered By: Regan Lemming on 02/19/2017 11:06:54

## 2017-02-21 NOTE — Progress Notes (Signed)
[name], [name] ([id number]) Visit Report for 02/19/2017 Debridement Details Patient Name: [name], [name] Date of Service: 02/19/2017 10:15 AM Medical Record Patient Account Number: [id number] Number: Treating RN: Baruch Gouty, RN, BSN, Velva Harman [birth year] [phone] y.o. Other Clinician: Date of Birth/Sex: Female) Treating ROBSON, [place] Primary Care Provider: Beverlyn Roux Provider/Extender: G Referring Provider: Dorthula Nettles in Treatment: 15 Debridement Performed for Wound #7 Right,Dorsal Metatarsal head first Assessment: Performed By: Physician Ricard Dillon, MD Debridement: Debridement Severity of Tissue Pre Fat layer exposed Debridement: Pre-procedure Verification/Time Out Yes - 11:16 Taken: Start Time: 11:16 Pain Control: Lidocaine 4% Topical Solution Level: Skin/Subcutaneous Tissue Total Area Debrided (L x 2.2 (cm) x 1 (cm) = 2.2 (cm) W): Tissue and other Non-Viable, Exudate, Fat, Fibrin/Slough, Subcutaneous material debrided: Instrument: Curette Bleeding: Minimum Hemostasis Achieved: Pressure End Time: 11:19 Procedural Pain: 0 Post Procedural Pain: 0 Response to Treatment: Procedure was tolerated well Post Debridement Measurements of Total Wound Length: (cm) 2.2 Width: (cm) 1 Depth: (cm) 1 Volume: (cm) 1.728 Character of Wound/Ulcer Post Requires Further Debridement Debridement: Severity of Tissue Post Debridement: Fat layer exposed Post Procedure Diagnosis Same as Pre-procedure [name] ([id number]) Electronic Signature(s) Signed: 02/19/2017 4:47:30 PM By: Linton Ham MD Signed: 02/19/2017 6:10:04 PM By: Regan Lemming BSN, RN Entered By: Linton Ham on 02/19/2017 12:35:44 [name], [name] ([id number]) -------------------------------------------------------------------------------- HPI Details Patient Name: [name], [name] Date of Service: 02/19/2017 10:15 AM Medical Record Patient Account Number: [id number] Number: Treating  RN: Baruch Gouty, RN, BSN, Velva Harman 1941/01/24 [phone] y.o. Other Clinician: Date of Birth/Sex: Female) Treating ROBSON, [name] Primary Care Provider: Beverlyn Roux Provider/Extender: G Referring Provider: Dorthula Nettles in Treatment: 15 History of Present Illness HPI Description: 08/13/16: This is a 76 year old woman who came predominantly for review of 3 cm in diameter circular wound to the left anterior lateral leg. She was in the ER on 08/01/16 I reviewed their notes. There was apparently pus coming out of the wound at that time and the patient arrived requesting debridement which they don't do in the emergency room. Nevertheless I can't see that they did any x-rays. There were no cultures done. She is a type II diabetic and I a note after the patient was in the clinic that she had a bypass graft from the popliteal to the tibial on the right on 02/28/16. She also had a right greater saphenous vein harvest on the same date for arterial bypass. She is going to have vascular studies including ABIs T ABIs on the right on 08/28/16. The patient's surgery was on 02/28/16 by Dr. Vallarie Mare she had a right below the knee popliteal artery to peroneal artery bypass with reverse greater saphenous vein and an endarterectomy of the mid segment peroneal artery. Postoperatively she had a strong mild monophasic peroneal signal with a pink foot. It would appear that the patient is had some nonhealing in the surgical saphenous vein harvest site on the left leg. Surprisingly looking [facility] I cannot see much information about this at all. Dr. Lucious Groves notes from 05/29/16 show that the patient's wounds "are not healed" the right first metatarsal wound healed but then opened back up. 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 At that point the patient had a wound VAC to her right calf with regards to the wounds on  her dorsal right toe would appear that these are felt to be arterial wounds. She has had  surgery on the metatarsal phalangeal in 2015 I Dr. Doran Durand secondary to a right metatarsal phalangeal joint fracture. She is apparently had discoloration around this area since then. 08/28/16; patient arrives with her wounds in much the same condition. The linear vein harvest site and the circular wound below it which I think was a blister. She also has to probing holes in her right great toe and a necrotic eschar on the right second toe. Because of these being arterial wounds I reduced her compression from 3-2 layers this seems to of done satisfactorily she has not had any problems. I cannot see that she is actually had an x-ray ====== 11/06/16 the patient comes in for evaluation of her right lower extremity ulcers. She was here in January 2018 for 2 visits subsequently ended up in the hospital with pneumonia and then to rehabilitation. She has now been discharged from rehabilitation and is home. She has multiple ulcerations to her right lower extremity including the foot and toes. She does have home health in place and they have been placing alginate to the ulcers. She is followed by Dr. Bridgett Larsson of vascular medicine. 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 She recently saw him on 3/23. In office ABIs were: Thor, [name] ([id number]) Right 0.48 with monophasic flow to the DP, PT, peroneal Left 0.63 with monophasic flow to the DP and PT Her arterial studies indicated a patent right below knee popliteal to peroneal bypass She had an MRI in February 2008 that was negative frosty myelitis but this showed general soft tissue edema in the right foot and lower extremity concerning for cellulitis She is a diabetic, managed with insulin. Her hemoglobin A1c in December 2017 was 8.4 which is a trend up from previous levels. She had blood work in  February 2018 which revealed an albumin of 2.6 this appears to be relatively acute as an albumin in November 2017 was 3.7 11/13/16; this is a patient I have not seen since February who is readmitted to our clinic last week. 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 Bridgett Larsson. I have reviewed Dr. Lianne Moris notes from March/23/18. Doppler ABI on that date showed an ABI on the right of 0.48 and on the left of 0.63. Dorsalis pedis waveforms were monophasic bilaterally. There was no waveforms detected at the posterior tibial on the right, monophasic on the left. Dr. Lianne Moris comments were that this patient would have follow-up vascular studies in 3 months including ABIs and right lower extremity arterial duplex. She had an MRI in February that was negative for osteomyelitis but showed generalized edema in the foot. Last albumin I see was in January at 3.4 we have been using Santyl to the 4 wounds on the right leg the patient is noted today to have widespread edema well up towards her groin this is pitting 2-3+. 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 Normal ejection fraction. 11/20/16; patient has a follow-up appointment with Dr. Bridgett Larsson on April 23. She is still complaining of a lot of pain in the right foot and right leg. It is not clear to me that this is at all positional however I think it is clear claudication with minimal activity perhaps at rest. At our suggestion she is return to her primary physician's office tomorrow with regards to  her pitting lower extremity bilateral edema that I reviewed in detail last week 11/27/16; the follow-up with Dr. Bridgett Larsson was actually on May 23 on April 23 as I stated in my note last week. N/A case all of her wounds seems somewhat smaller. The 2 on the right leg are definitely smaller. The areas on the dorsal right first toe, right third toe and the  lateral part of the right fifth metatarsal head all looks smaller but have tightly adherent surfaces. We have been using Santyl 12/04/16; follow-up with Dr. Bridgett Larsson on May 23. 2 small open wounds on the right leg continue to get smaller. The area on the right third total lateral aspect of the right fifth toe also look better. The remaining area on the dorsal first toe still has some depth to it. We have been using Santyl to the toes and collagen on the right 12/11/16; according to patient's husband the follow-up with Dr. Bridgett Larsson is not until July. All of the wounds on the right leg are measuring smaller. We have been using some combination of Prisma and Santyl although I think we can go to straight Prisma today. There may have been some confusion with home health about the primary dressing orders here. 12/25/16; the patient has had some healing this week. The area on her right lateral fifth metatarsal head, right third toe are both healed lower right leg is healed. In the vein harvest site superiorly she has one superficial open area. On the dorsal aspect of her right great toe/MTP joint the wound is now divided into 2 however the proximal area is deep and there is palpable bone 01/01/17; still an open area in the middle of her original right surgical scar. The area on the right third toe and right lateral fifth metatarsal head remained closed. Problematic area on the dorsal aspect of the toe. Previous surgery in this area line 01/08/17 small open area on the original scar on her upper anterior leg although this is closing. X-ray I did of the right first toe did not show underlying bony abnormality. Still this area on the dorsal first toe probes to bone. We have been using Prisma 01/15/17; small open area in the original scar in the upper anterior leg is almost fully closed. She has 2 open areas over the dorsal aspect of the right first toe that probes to bone. Used and a form starting last week. Vigorous  bone scraping that I did last week showed few methicillin sensitive staph aureus. She is allergic to Fischbach, Shauntavia V. ([id number]) penicillin and sulfa drugs. I'm going to give her 2 weeks of doxycycline. She will need an MRI with contrast. 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 She continues on doxycycline for a bone scraping that showed methicillin sensitive staph aureus [allergic to penicillin and sulfa]. We have been using Endo form to the wound. 01/29/17; surprisingly her MRI did not show osteomyelitis. She continues on doxycycline for a bone scraping that showed methicillin sensitive staph aureus with allergies to penicillin and sulfa. We have been using Endo form to the wound. Unfortunately I cannot get a surface on this visit looks like it is able to support a healing state. Proximally there is still exposed bone. There is no overt soft tissue tenderness. Her MRI did show a previous fracture was surgery to this area but no hardware 02/12/17 on evaluation today patient appears to  be doing well in regard to her lower extremity wound. She does have some mild discomfort but this is minimal. There's no evidence of infection. Her left great toe nail has somewhat been lifted up and there is a little bit of slight bleeding underneath but this is still firmly attached. 02/19/17; she ran out of doxycycline 2 days ago. Nevertheless I would like to continue this for 2 weeks to make a full 6 weeks of therapy as the bone scraping that I did from the open area on the dorsal right first toe showed MRSA. This should complete antibiotic therapy. Electronic Signature(s) Signed: 02/19/2017 4:47:30 PM By: Linton Ham MD Entered By: Linton Ham on 02/19/2017 12:36:46 [name], [name] ([id number]) -------------------------------------------------------------------------------- Physical Exam Details Patient Name: [name], [name]  V. Date of Service: 02/19/2017 10:15 AM Medical Record Patient Account Number: [id number] Number: Treating RN: Baruch Gouty, RN, BSN, Velva Harman 25-Sep-1940 [phone] y.o. Other Clinician: Date of Birth/Sex: Female) Treating ROBSON, [name] Primary Care Provider: Beverlyn Roux Provider/Extender: G Referring Provider: Dorthula Nettles in Treatment: 15 Constitutional Patient is hypertensive.. Pulse regular and within target range for patient.[name] Respirations regular, non-labored and within target range.. Temperature is normal and within the target range for the patient.[name] appears in no distress. Cardiovascular Dorsalis pedis pulses palpable. Notes Wound exam; right great toe dorsally surface debridement of necrotic tissue. There is still open bone superiorly but I think most of this appears to have granulation. There is no obvious evidence of infection Electronic Signature(s) Signed: 02/19/2017 4:47:30 PM By: Linton Ham MD Entered By: Linton Ham on 02/19/2017 12:37:53 [name], [name] ([id number]) -------------------------------------------------------------------------------- Physician Orders Details Patient Name: [name], [name] Date of Service: 02/19/2017 10:15 AM Medical Record Patient Account Number: [id number] Number: Treating RN: Baruch Gouty, RN, BSN, Velva Harman Nov 27, 1940 [phone] y.o. Other Clinician: Date of Birth/Sex: Female) Treating ROBSON, [name] Primary Care Provider: Beverlyn Roux Provider/Extender: G Referring Provider: Dorthula Nettles in Treatment: 15 Verbal / Phone Orders: No Diagnosis Coding Wound Cleansing Wound #7 Right,Dorsal Metatarsal head first o Clean wound with Normal Saline. o Cleanse wound with mild soap and water Anesthetic Wound #7 Right,Dorsal Metatarsal head first o Topical Lidocaine 4% cream applied to wound bed prior to debridement Primary Wound Dressing Wound #10 Left Toe Great o Other: - Triple antibiotic Wound #7 Right,Dorsal Metatarsal  head first o Other: - ENDOFORM Secondary Dressing Wound #10 Left Toe Great o Dry Gauze o Conform/Kerlix Wound #7 Right,Dorsal Metatarsal head first o Dry Gauze o Conform/Kerlix Dressing Change Frequency Wound #7 Right,Dorsal Metatarsal head first o Change dressing every other day. Follow-up Appointments Wound #10 Left Toe Great o Return Appointment in 1 week. Wound #7 Right,Dorsal Metatarsal head first [name], [name] ([id number]) o Return Appointment in 1 week. Edema Control Wound #10 Left Toe Great o Elevate legs to the level of the heart and pump ankles as often as possible Wound #7 Right,Dorsal Metatarsal head first o Elevate legs to the level of the heart and pump ankles as often as possible Additional Orders / Instructions Wound #7 Right,Dorsal Metatarsal head first o Increase protein intake. o Activity as tolerated Home Health Wound #10 Left [place] Visits - [facility] twice weekly, [facility]-Wednesday o Home Health Nurse may visit PRN to address patientos wound care needs. o FACE TO FACE ENCOUNTER: MEDICARE and MEDICAID PATIENTS: I certify that this patient is under my care and that I had a face-to-face encounter that meets the physician face-to-face encounter requirements with this patient  on this date. The encounter with the patient was in whole or in part for the following MEDICAL CONDITION: (primary reason for [place]) MEDICAL NECESSITY: I certify, that based on my findings, NURSING services are a medically necessary home health service. HOME BOUND STATUS: I certify that my clinical findings support that this patient is homebound (i.e., Due to illness or injury, pt requires aid of supportive devices such as crutches, cane, wheelchairs, walkers, the use of special transportation or the assistance of another person to leave their place of residence. There is a normal inability to leave the home and  doing so requires considerable and taxing effort. Other absences are for medical reasons / religious services and are infrequent or of short duration when for other reasons). o If current dressing causes regression in wound condition, may D/C ordered dressing product/s and apply Normal Saline Moist Dressing daily until next [place] / Other MD appointment. [place] of regression in wound condition at [phone]. o Please direct any NON-WOUND related issues/requests for orders to patient's Primary Care Physician Wound #7 Right,Dorsal Metatarsal head first o [place] Visits - [facility] twice weekly, [facility]-Wednesday o Home Health Nurse may visit PRN to address patientos wound care needs. o FACE TO FACE ENCOUNTER: MEDICARE and MEDICAID PATIENTS: I certify that this patient is under my care and that I had a face-to-face encounter that meets the physician face-to-face encounter requirements with this patient on this date. The encounter with the patient was in whole or in part for the following MEDICAL CONDITION: (primary reason for [place]) MEDICAL NECESSITY: I certify, that based on my findings, NURSING services are a medically necessary home health service. HOME BOUND STATUS: I certify that my clinical findings support that this patient is homebound (i.e., Due to illness or injury, pt requires aid of supportive devices such as crutches, cane, wheelchairs, walkers, the use of special transportation or the assistance of another person to leave their place of residence. There is a Ostrosky, [name] ([id number]) normal inability to leave the home and doing so requires considerable and taxing effort. Other absences are for medical reasons / religious services and are infrequent or of short duration when for other reasons). o If current dressing causes regression in wound condition, may D/C ordered dressing product/s and apply  Normal Saline Moist Dressing daily until next [place] / Other MD appointment. [place] of regression in wound condition at [phone]. o Please direct any NON-WOUND related issues/requests for orders to patient's Primary Care Physician Medications-please add to medication list. Wound #10 Left Toe Great o P.O. Antibiotics - Doxy 100mg  BID Wound #7 Right,Dorsal Metatarsal head first o P.O. 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 Date of Service: 02/19/2017 10:15 AM Medical Record Patient Account Number: [id number] Number: Treating RN: Baruch Gouty, RN, BSN, Velva Harman 1940-10-27 [phone] y.o. 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 ([id number]) -------------------------------------------------------------------------------- Progress Note Details Patient Name: Baca, Bisma V. Date of Service: 02/19/2017 10:15 AM Medical Record Patient Account Number: [id number] Number: Treating RN: Baruch Gouty, RN, BSN, Velva Harman 12-24-1940 [phone] y.o. Other Clinician: Date of Birth/Sex: Female) Treating ROBSON, [name] Primary Care Provider: Beverlyn Roux Provider/Extender: G Referring Provider: Dorthula Nettles in Treatment: 15 Subjective History of Present Illness (HPI) 08/13/16: This is a 76 year old woman who came predominantly for review of 3 cm in diameter circular wound to the left anterior lateral leg. She was in the ER on 08/01/16 I reviewed their notes. There was apparently pus coming out of the wound at that time and the patient arrived requesting debridement which they don't do in the emergency room. Nevertheless I can't see that they did any x-rays. There were no cultures done. She is a type II diabetic and I a note after the patient was in the clinic that she had a bypass graft from the popliteal to the tibial on the right on 02/28/16. She also had a right greater saphenous vein harvest on the same date for arterial bypass. She is going to have vascular studies including ABIs T ABIs on the right on 08/28/16. The patient's surgery was on 02/28/16 by Dr. Vallarie Mare she had a right below the knee popliteal artery to peroneal artery bypass with reverse greater saphenous vein and an endarterectomy of the mid segment peroneal artery. Postoperatively she had a  strong mild monophasic peroneal signal with a pink foot. It would appear that the patient is had some nonhealing in the surgical saphenous vein harvest site on the left leg. Surprisingly [facility] I cannot see much information about this at all. Dr. Lucious Groves notes from 05/29/16 show that the patient's wounds "are not healed" the right first metatarsal wound healed but then opened back up. 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 At that point the patient had a wound VAC to her right calf with regards to the wounds on her dorsal right toe would appear that these are felt to be arterial wounds. She has had surgery on the metatarsal phalangeal in 2015 I Dr. Doran Durand secondary to a right metatarsal phalangeal joint fracture. She is apparently had discoloration around this area since then. 08/28/16; patient arrives with her wounds in much the same condition. The linear vein harvest site and the circular wound below it which I think was a blister. She also has to probing holes in her right great toe and a necrotic eschar on the right second toe. Because of these being arterial wounds I reduced her compression from 3-2 layers this seems to of done satisfactorily she has not had any problems. I cannot see that she is actually had an x-ray ====== 11/06/16 the  patient comes in for evaluation of her right lower extremity ulcers. She was here in January 2018 for 2 visits subsequently ended up in the hospital with pneumonia and then to rehabilitation. She has now been discharged from rehabilitation and is home. She has multiple ulcerations to her right lower extremity including the foot and toes. She does have home health in place and they have been placing alginate to the ulcers. She is followed by Dr. Bridgett Larsson of vascular medicine. She is status post a bypass graft to the right below  knee popliteal to peroneal using reverse GSV in July 2017. She recently saw him on 3/23. In office ABIs were: Soper, JAKIA KENNEBREW. ([id number]) Right 0.48 with monophasic flow to the DP, PT, peroneal Left 0.63 with monophasic flow to the DP and PT Her arterial studies indicated a patent right below knee popliteal to peroneal bypass She had an MRI in February 2008 that was negative frosty myelitis but this showed general soft tissue edema in the right foot and lower extremity concerning for cellulitis She is a diabetic, managed with insulin. Her hemoglobin A1c in December 2017 was 8.4 which is a trend up from previous levels. She had blood work in February 2018 which revealed an albumin of 2.6 this appears to be relatively acute as an albumin in November 2017 was 3.7 11/13/16; this is a patient I have not seen since February who is readmitted to our clinic last week. 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 Bridgett Larsson. I have reviewed Dr. Lianne Moris notes from March/23/18. Doppler ABI on that date showed an ABI on the right of 0.48 and on the left of 0.63. Dorsalis pedis waveforms were monophasic bilaterally. There was no waveforms detected at the posterior tibial on the right, monophasic on the left. Dr. Lianne Moris comments were that this patient would have follow-up vascular studies in 3 months including ABIs and right lower extremity arterial duplex. She had an MRI in February that was negative for osteomyelitis but showed generalized edema in the foot. Last albumin I see was in January at 3.4 we have been using Santyl to the 4 wounds on the right leg the patient is noted today to have widespread edema well up towards her groin this is pitting 2-3+. 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 Normal ejection fraction. 11/20/16; patient has a follow-up appointment with Dr. Bridgett Larsson on April  23. She is still complaining of a lot of pain in the right foot and right leg. It is not clear to me that this is at all positional however I think it is clear claudication with minimal activity perhaps at rest. At our suggestion she is return to her primary physician's office tomorrow with regards to her pitting lower extremity bilateral edema that I reviewed in detail last week 11/27/16; the follow-up with Dr. Bridgett Larsson was actually on May 23 on April 23 as I stated in my note last week. N/A case all of her wounds seems somewhat smaller. The 2 on the right leg are definitely smaller. The areas on the dorsal right first toe, right third toe and the lateral part of the right fifth metatarsal head all looks smaller but have tightly adherent surfaces. We have been using Santyl 12/04/16; follow-up with Dr. Bridgett Larsson on May 23. 2 small open wounds on the right leg continue to get smaller. The area on the right third  total lateral aspect of the right fifth toe also look better. The remaining area on the dorsal first toe still has some depth to it. We have been using Santyl to the toes and collagen on the right 12/11/16; according to patient's husband the follow-up with Dr. Bridgett Larsson is not until July. All of the wounds on the right leg are measuring smaller. We have been using some combination of Prisma and Santyl although I think we can go to straight Prisma today. There may have been some confusion with home health about the primary dressing orders here. 12/25/16; the patient has had some healing this week. The area on her right lateral fifth metatarsal head, right third toe are both healed lower right leg is healed. In the vein harvest site superiorly she has one superficial open area. On the dorsal aspect of her right great toe/MTP joint the wound is now divided into 2 however the proximal area is deep and there is palpable bone 01/01/17; still an open area in the middle of her original right surgical scar. The area on  the right third toe and right lateral fifth metatarsal head remained closed. Problematic area on the dorsal aspect of the toe. Previous surgery in this area line 01/08/17 small open area on the original scar on her upper anterior leg although this is closing. X-ray I did of the right first toe did not show underlying bony abnormality. Still this area on the dorsal first toe probes to bone. We have been using Prisma 01/15/17; small open area in the original scar in the upper anterior leg is almost fully closed. She has 2 open areas over the dorsal aspect of the right first toe that probes to bone. Used and a form starting last week. Vigorous bone scraping that I did last week showed few methicillin sensitive staph aureus. She is allergic to Patman, Arushi V. ([id number]) penicillin and sulfa drugs. I'm going to give her 2 weeks of doxycycline. She will need an MRI with contrast. 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 She continues on doxycycline for a bone scraping that showed methicillin sensitive staph aureus [allergic to penicillin and sulfa]. We have been using Endo form to the wound. 01/29/17; surprisingly her MRI did not show osteomyelitis. She continues on doxycycline for a bone scraping that showed methicillin sensitive staph aureus with allergies to penicillin and sulfa. We have been using Endo form to the wound. Unfortunately I cannot get a surface on this visit looks like it is able to support a healing state. Proximally there is still exposed bone. There is no overt soft tissue tenderness. Her MRI did show a previous fracture was surgery to this area but no hardware 02/12/17 on evaluation today patient appears to be doing well in regard to her lower extremity wound. She does have some mild discomfort but this is minimal. There's no evidence of infection. Her left great toe nail has somewhat been lifted up and  there is a little bit of slight bleeding underneath but this is still firmly attached. 02/19/17; she ran out of doxycycline 2 days ago. Nevertheless I would like to continue this for 2 weeks to make a full 6 weeks of therapy as the bone scraping that I did from the open area on the dorsal right first toe showed MRSA. This should complete antibiotic therapy. Objective Constitutional Patient is hypertensive.. Pulse regular and within target range  for patient.[name] Respirations regular, non-labored and within target range.. Temperature is normal and within the target range for the patient.[name] appears in no distress. Vitals Time Taken: 11:03 AM, Height: 64 in, Weight: 200 lbs, BMI: 34.3, Temperature: 97.6 F, Pulse: 70 bpm, Respiratory Rate: 16 breaths/min, Blood Pressure: 162/58 mmHg. Cardiovascular Dorsalis pedis pulses palpable. General Notes: Wound exam; right great toe dorsally surface debridement of necrotic tissue. There is still open bone superiorly but I think most of this appears to have granulation. There is no obvious evidence of infection Integumentary (Hair, Skin) Wound #10 status is Open. Original cause of wound was Gradually Appeared. The wound is located on the Left Toe Great. The wound measures 0.4cm length x 0.4cm width x 0.1cm depth; 0.126cm^2 area and 0.013cm^3 volume. There is Fat Layer (Subcutaneous Tissue) Exposed exposed. There is no tunneling or undermining noted. There is a small amount of serosanguineous drainage noted. The wound margin is flat [name], [name] ([id number]) and intact. There is large (67-100%) pink, pale granulation within the wound bed. There is no necrotic tissue within the wound bed. The periwound skin appearance did not exhibit: Callus, Crepitus, Excoriation, Induration, Rash, Scarring, Dry/Scaly, Maceration, Atrophie Blanche, Cyanosis, Ecchymosis, Hemosiderin Staining, Mottled, Pallor, Rubor, Erythema. Periwound temperature was noted as No  Abnormality. Wound #7 status is Open. Original cause of wound was Gradually Appeared. The wound is located on the Right,Dorsal Metatarsal head first. The wound measures 2.2cm length x 1cm width x 1cm depth; 1.728cm^2 area and 1.728cm^3 volume. There is bone, muscle, and Fat Layer (Subcutaneous Tissue) Exposed exposed. There is no tunneling or undermining noted. There is a large amount of serous drainage noted. The wound margin is flat and intact. There is small (1-33%) pink, friable granulation within the wound bed. There is a medium (34-66%) amount of necrotic tissue within the wound bed including Adherent Slough. The periwound skin appearance exhibited: Induration, Scarring, Ecchymosis, Hemosiderin Staining, Mottled. The periwound skin appearance did not exhibit: Maceration. Periwound temperature was noted as No Abnormality. The periwound has tenderness on palpation. 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 There was a Skin/Subcutaneous Tissue Debridement ([id number]) debridement with total area of 2.2 sq cm performed by Ricard Dillon, MD. with the following instrument(s): Curette to remove Non-Viable tissue/material including Exudate, Fat Layer (and  Subcutaneous Tissue) Exposed, Fibrin/Slough, and Subcutaneous after achieving pain control using Lidocaine 4% Topical Solution. A time out was conducted at 11:16, prior to the start of the procedure. A Minimum amount of bleeding was controlled with Pressure. The procedure was tolerated well with a pain level of 0 throughout and a pain level of 0 following the procedure. Post Debridement Measurements: 2.2cm length x 1cm width x 1cm depth; 1.728cm^3 volume. Character of Wound/Ulcer Post Debridement requires further debridement. Severity of Tissue Post Debridement is: Fat layer exposed. [name], [name] (  [id number]) Post procedure Diagnosis Wound #7: Same as Pre-Procedure Plan Wound Cleansing: Wound #7 Right,Dorsal Metatarsal head first: Clean wound with Normal Saline. Cleanse wound with mild soap and water Anesthetic: Wound #7 Right,Dorsal Metatarsal head first: Topical Lidocaine 4% cream applied to wound bed prior to debridement Primary Wound Dressing: Wound #10 Left Toe Great: Other: - Triple antibiotic Wound #7 Right,Dorsal Metatarsal head first: Other: - ENDOFORM Secondary Dressing: Wound #10 Left Toe Great: Dry Gauze Conform/Kerlix Wound #7 Right,Dorsal Metatarsal head first: Dry Gauze Conform/Kerlix Dressing Change Frequency: Wound #7 Right,Dorsal Metatarsal head first: Change dressing every other day. Follow-up Appointments: Wound #10 Left Toe Great: Return Appointment in 1 week. Wound #7 Right,Dorsal Metatarsal head first: Return Appointment in 1 week. Edema Control: Wound #10 Left Toe Great: Elevate legs to the level of the heart and pump ankles as often as possible Wound #7 Right,Dorsal Metatarsal head first: Elevate legs to the level of the heart and pump ankles as often as possible Additional Orders / Instructions: Wound #7 Right,Dorsal Metatarsal head first: Increase protein intake. Activity as tolerated Home Health: Wound #10 Left Toe Great: Continue Home  Health Visits - [facility] weekly, [facility] Nurse may visit [facility] to address patient s wound care needs. FACE TO FACE ENCOUNTER: MEDICARE and MEDICAID PATIENTS: I certify that this patient is under [name], [name] ([id number]) my care and that I had a face-to-face encounter that meets the physician face-to-face encounter requirements with this patient on this date. The encounter with the patient was in whole or in part for the following MEDICAL CONDITION: (primary reason for [place]) MEDICAL NECESSITY: I certify, that based on my findings, NURSING services are a medically necessary home health service. HOME BOUND STATUS: I certify that my clinical findings support that this patient is homebound (i.e., Due to illness or injury, pt requires aid of supportive devices such as crutches, cane, wheelchairs, walkers, the use of special transportation or the assistance of another person to leave their place of residence. There is a normal inability to leave the home and doing so requires considerable and taxing effort. Other absences are for medical reasons / religious services and are infrequent or of short duration when for other reasons). If current dressing causes regression in wound condition, may D/C ordered dressing product/s and apply Normal Saline Moist Dressing daily until next [place] / Other MD appointment. [place] of regression in wound condition at [phone]. Please direct any NON-WOUND related issues/requests for orders to patient's Primary Care Physician Wound #7 Right,Dorsal Metatarsal head first: [place] Visits - [facility] twice weekly, [facility] Nurse may visit [facility] to address patient s wound care needs. FACE TO FACE ENCOUNTER: MEDICARE and MEDICAID PATIENTS: I certify that this patient is under my care and that I had a face-to-face encounter that meets the physician  face-to-face encounter requirements with this patient on this date. The encounter with the patient was in whole or in part for the following MEDICAL CONDITION: (primary reason for [place]) MEDICAL NECESSITY: I certify, that based on my findings, NURSING services are a medically necessary home health service. HOME BOUND STATUS: I certify that my clinical findings support that this patient is homebound (i.e., Due to illness or injury, pt requires aid of supportive devices such as crutches, cane, wheelchairs, walkers, the use of special transportation or the assistance of another person to leave their place of residence. There is a normal inability to leave  the home and doing so requires considerable and taxing effort. Other absences are for medical reasons / religious services and are infrequent or of short duration when for other reasons). If current dressing causes regression in wound condition, may D/C ordered dressing product/s and apply Normal Saline Moist Dressing daily until next [employer] / Other MD appointment. [place] of regression in wound condition at [phone]. Please direct any NON-WOUND related issues/requests for orders to patient's Primary Care Physician Medications-please add to medication list.: Wound #10 Left Toe Great: P.O. Antibiotics - Doxy 100mg  BID Wound #7 Right,Dorsal Metatarsal head first: P.O. Antibiotics - Doxy 100mg  BID The following medication(s) was prescribed: doxycycline monohydrate oral 100 mg capsule 1 capsule oral bid for osteomyelitis right great toe starting 02/19/2017 General Notes: RUn BV for AFFINITY #1 change the primary dressing to Endo form #2 another 2 weeks of doxycycline and then we will finished treatment #3 affinity thourgh her insurance DEAJA, [name] ([id number]) Electronic Signature(s) Signed: 02/19/2017 4:47:30 PM By: Linton Ham MD Entered By: Linton Ham on 02/19/2017 12:39:11 [place],  [place]. ([id number]) -------------------------------------------------------------------------------- [place] Details Patient Name: Robb, Marzetta V. Date of Service: 02/19/2017 Medical Record Patient Account Number: [id number] Number: Treating RN: Baruch Gouty, RN, BSN, Velva Harman 12-28-1940 [id number] y.o. 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 I70.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 of foot Facility Procedures CPT4: Description Modifier Quantity Code [id number] 11042 - DEB SUBQ TISSUE 20 SQ CM/< 1 ICD-10 Description Diagnosis I70.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 of foot E11.622 Type 2 diabetes mellitus with other  skin ulcer Physician Procedures CPT4: Description Modifier Quantity Code [id number] 11173 - WC PHYS SUBQ TISS 20 SQ CM 1 ICD-10 Description Diagnosis I70.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 of foot E11.622 Type 2 diabetes mellitus with other  skin ulcer Electronic Signature(s) Signed: 02/19/2017 4:47:30 PM By: Linton Ham MD Radermacher, [name] ([id number]) Entered By: Linton Ham on 02/19/2017 12:39:48

## 2017-02-26 ENCOUNTER — Encounter: Payer: Medicare HMO | Admitting: Internal Medicine

## 2017-02-26 DIAGNOSIS — E11622 Type 2 diabetes mellitus with other skin ulcer: Secondary | ICD-10-CM | POA: Diagnosis not present

## 2017-02-28 NOTE — Progress Notes (Signed)
FLOR, WHITACRE ([id number]) Visit Report for 02/26/2017 Arrival Information Details Patient Name: [name] [name]. Date of Service: 02/26/2017 11:15 AM Medical Record Number: [id number] Patient Account Number: [id number] Date of Birth/Sex: 1941-07-02 (76 y.o. Female) Treating RN: Afful, RN, BSN, Velva Harman Primary Care [name] [name]: Beverlyn Roux Other Clinician: Referring Jaiona Simien: Beverlyn Roux Treating Yui Mulvaney/Extender: Tito Dine in Treatment: 16 Visit Information History Since Last Visit All ordered tests and consults were completed: No Patient Arrived: Wheel Chair Added or deleted any medications: No Arrival Time: 11:26 Any new allergies or adverse reactions: No Accompanied By: HUBBY Had a fall or experienced change in No activities of daily living that may affect Transfer Assistance: Manual risk of falls: Patient Identification Verified: Yes Signs or symptoms of abuse/neglect since last No Secondary Verification Process Yes visito Completed: Hospitalized since last visit: No Patient Requires Transmission-Based No Has Dressing in Place as Prescribed: Yes Precautions: Pain Present Now: No Patient Has Alerts: No Electronic Signature(s) Signed: 02/26/2017 2:24:03 PM By: Regan Lemming BSN, RN Entered By: Regan Lemming on 02/26/2017 11:26:33 [name] [name] [name] ([id number]) -------------------------------------------------------------------------------- Encounter Discharge Information Details Patient Name: [name] [name] V. Date of Service: 02/26/2017 11:15 AM Medical Record Number: [id number] Patient Account Number: [id number] Date of Birth/Sex: 1941-02-22 (76 y.o. Female) Treating RN: Afful, RN, BSN, Velva Harman Primary Care [name] [name]: Beverlyn Roux Other Clinician: Referring [name] [name]: Beverlyn Roux Treating Aiza Vollrath/Extender: Tito Dine in Treatment: 16 Encounter Discharge Information Items Discharge Pain Level: 0 Discharge Condition: Stable Ambulatory Status:  Wheelchair Discharge Destination: Home Transportation: Other Accompanied By: hubby Schedule Follow-up Appointment: No Medication Reconciliation completed and provided to Patient/Care No [name] [name]: Provided on Clinical Summary of Care: 02/26/2017 Form Type Recipient Paper Patient CW Electronic Signature(s) Signed: 02/26/2017 12:11:46 PM By: Ruthine Dose Entered By: Ruthine Dose on 02/26/2017 12:11:45 Adamec, [name] V. ([id number]) -------------------------------------------------------------------------------- Lower Extremity Assessment Details Patient Name: [name] [name] V. Date of Service: 02/26/2017 11:15 AM Medical Record Number: [id number] Patient Account Number: [id number] Date of Birth/Sex: [birth year] (76 y.o. Female) Treating RN: Afful, RN, BSN, Velva Harman Primary Care Filomeno Cromley: Beverlyn Roux Other Clinician: Referring Yamel Bale: Beverlyn Roux Treating Sharie Amorin/Extender: Tito Dine in Treatment: 16 Vascular Assessment Claudication: Claudication Assessment [Right:None] Pulses: Dorsalis Pedis Palpable: [Right:Yes] Posterior Tibial Extremity colors, hair growth, and conditions: Extremity Color: [Right:Mottled] Hair Growth on Extremity: [Right:No] Temperature of Extremity: [Right:Warm] Capillary Refill: [Right:< 3 seconds] Toe Nail Assessment Left: Right: Thick: Yes Discolored: Yes Deformed: Yes Improper Length and Hygiene: Yes Electronic Signature(s) Signed: 02/26/2017 2:24:03 PM By: Regan Lemming BSN, RN Entered By: Regan Lemming on 02/26/2017 11:31:41 Eichel, Miyana V. ([id number]) -------------------------------------------------------------------------------- Multi Wound Chart Details Patient Name: [name] [name] V. Date of Service: 02/26/2017 11:15 AM Medical Record Number: [id number] Patient Account Number: [id number] Date of Birth/Sex: 18-Feb-1941 (76 y.o. 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] [7:the Lower Extremity] Comorbid History: [10:Cataracts, Chronic sinus Cataracts, Chronic sinus N/A problems/congestion, Congestive Heart Failure, Congestive Heart Failure, Hypertension, Type II Diabetes] [7:problems/congestion, Hypertension, Type II Diabetes] Date Acquired: [10:02/04/2017] [7:08/06/2013] [N/A:N/A] Weeks of Treatment: [10:2] [7:16] [N/A:N/A] Wound  Status: [10:Open] [7:Open] [N/A:N/A] Measurements L x W x D 0.4x0.4x0.1 [7:2.3x1x1] [N/A:N/A] (cm) Area (cm) : [10:0.126] [7:1.806] [N/A:N/A] Volume (cm) : [10:0.013] [7:1.806] [N/A:N/A] % Reduction in Area: [10:35.70%] [7:-53.30%] [N/A:N/A] % Reduction in Volume: 35.00% [7:-411.60%] [N/A:N/A] Classification: [10:Grade 1] [7:Grade 2] [N/A:N/A] Exudate Amount: [10:Small] [7:Large] [N/A:N/A] Exudate Type: [10:Serosanguineous] [7:Serous] [N/A:N/A] Exudate Color: [10:red, brown] [7:amber] [N/A:N/A] Wound Margin: [10:Flat and Intact] [7:Flat and Intact] [N/A:N/A] Granulation Amount: [10:Large (67-100%)] [7:Small (1-33%)] [N/A:N/A] Granulation Quality: [10:Pink, Pale] [7:Pink, Friable] [N/A:N/A] Necrotic Amount: [10:None Present (0%)] [7:Medium (34-66%)] [N/A:N/A] Exposed Structures: [10:Fat Layer (Subcutaneous Fat Layer (Subcutaneous N/A Tissue) Exposed: Yes] [7:Tissue) Exposed: Yes] Muscle: Yes Bone: Yes Fascia: No Tendon: No Joint: No Epithelialization: None Small  (1-33%) N/A Debridement: N/A Debridement ([id number]- N/A 11047) Pre-procedure N/A 11:49 N/A Verification/Time Out Taken: Pain Control: N/A Lidocaine 4% Topical N/A Solution Tissue Debrided: N/A Fibrin/Slough, Fat, N/A Subcutaneous Level: N/A Skin/Subcutaneous N/A Tissue Debridement Area (sq N/A 2.3 N/A cm): Instrument: N/A Curette N/A Bleeding: N/A Minimum N/A Hemostasis Achieved: N/A Pressure N/A Procedural Pain: N/A 0 N/A Post Procedural Pain: N/A 0 N/A Debridement Treatment N/A Procedure was tolerated N/A Response: well Post Debridement N/A 2.3x1x1 N/A Measurements L x W x D (cm) Post Debridement N/A 1.806 N/A Volume: (cm) Periwound Skin Texture: Excoriation: No Induration: Yes N/A Induration: No Scarring: Yes Callus: No Crepitus: No Rash: No Scarring: No Periwound Skin Maceration: No Maceration: No N/A Moisture: Dry/Scaly: No Periwound Skin Color: Atrophie Blanche: No Ecchymosis: Yes N/A Cyanosis: No Hemosiderin Staining: Yes Ecchymosis: No Mottled: Yes Erythema: No Hemosiderin Staining: No Mottled: No Pallor: No Rubor: No Temperature: No Abnormality No Abnormality N/A Tenderness on No Yes N/A Palpation: Areola, Kelsey V. ([id number]) Wound Preparation: Ulcer Cleansing: Ulcer Cleansing: N/A Rinsed/Irrigated with Rinsed/Irrigated with Saline Saline, Other: soap and water Topical Anesthetic Applied: None Topical Anesthetic Applied: Other: lidociane 4% Procedures Performed: N/A Debridement N/A Treatment Notes Wound #10 (Left Toe Great) 1. Cleansed with: Clean wound with Normal Saline 4. Dressing Applied: Other dressing (specify in notes) 5. Secondary Dressing Applied Dry Gauze Kerlix/Conform 7. Secured with Tape Notes endoform Wound #7 (Right, Dorsal Metatarsal head first) 1. Cleansed with: Clean wound with Normal Saline 4. Dressing Applied: Other dressing (specify in notes) 5. Secondary Dressing Applied Dry Gauze Kerlix/Conform 7.  Secured with Tape Notes endoform Electronic Signature(s) Signed: 02/26/2017 6:25:53 PM By: Linton Ham MD Entered By: Linton Ham on 02/26/2017 12:30:31 Hemstreet, [name] ([id number]) -------------------------------------------------------------------------------- Multi-Disciplinary Care Plan Details Patient Name: [name] [name] V. Date of Service: 02/26/2017 11:15 AM Medical Record Number: [id number] Patient Account Number: [id number] Date of Birth/Sex: [birth year] (76 y.o. Female) Treating RN: Afful, RN, BSN, Velva Harman Primary Care Kjell Brannen: Beverlyn Roux Other Clinician: Referring Glen Kesinger: Beverlyn Roux Treating Dayon Witt/Extender: Tito Dine in Treatment: 16 Active Inactive ` Abuse / Safety / Falls / Self Care Management Nursing Diagnoses: Impaired physical mobility Potential for falls Goals: Patient will remain injury free Date Initiated: 11/06/2016 Target Resolution Date: 12/30/2016 Goal Status: Active Patient/caregiver will verbalize understanding of skin care regimen Date Initiated: 11/06/2016 Target Resolution Date: 12/30/2016 Goal Status: Active Interventions: Assess fall risk on admission and as needed Treatment Activities: Patient referred to home care : 11/06/2016 Notes: ` Nutrition Nursing Diagnoses: Potential for alteratiion in Nutrition/Potential for imbalanced nutrition Goals: Patient/caregiver verbalizes understanding of need to maintain therapeutic glucose control per primary care physician Date Initiated: 11/06/2016 Target Resolution Date: 12/30/2016 Goal Status: Active Interventions: Provide education on elevated blood sugars and impact on wound healing Burress, Merly V. ([id number])  Notes: ` Orientation to the Wound Care Program Nursing Diagnoses: Knowledge deficit related to the wound healing center program Goals: Patient/caregiver will verbalize understanding of the [facility] Date Initiated: 11/06/2016 Target Resolution Date:  12/30/2016 Goal Status: Active Interventions: Provide education on orientation to the wound center Notes: ` Venous Leg Ulcer Nursing Diagnoses: Actual venous Insuffiency (use after diagnosis is confirmed) Knowledge deficit related to disease process and management Goals: Non-invasive venous studies are completed as ordered Date Initiated: 11/06/2016 Target Resolution Date: 12/30/2016 Goal Status: Active Patient/caregiver will verbalize understanding of disease process and disease management Date Initiated: 11/06/2016 Target Resolution Date: 12/30/2016 Goal Status: Active Interventions: Assess peripheral edema status every visit. Notes: ` Wound/Skin Impairment Nursing Diagnoses: Impaired tissue integrity Knowledge deficit related to smoking impact on wound healing Knowledge deficit related to ulceration/compromised skin integrity Goals: Mirelez, Julionna V. ([id number]) Ulcer/skin breakdown will heal within 14 weeks Date Initiated: 11/06/2016 Target Resolution Date: 02/05/2017 Goal Status: Active Interventions: Assess ulceration(s) every visit Treatment Activities: Skin care regimen initiated : 11/06/2016 Notes: Electronic Signature(s) Signed: 02/26/2017 2:24:03 PM By: Regan Lemming BSN, RN Entered By: Regan Lemming on 02/26/2017 11:49:18 [name] [name] V. ([id number]) -------------------------------------------------------------------------------- Pain Assessment Details Patient Name: [name] [name] V. Date of Service: 02/26/2017 11:15 AM Medical Record Number: [id number] Patient Account Number: [id number] Date of Birth/Sex: 04-27-1941 (76 y.o. Female) Treating RN: Afful, RN, BSN, Velva Harman Primary Care Tyronica Truxillo: Beverlyn Roux Other Clinician: Referring Hezzie Karim: Beverlyn Roux Treating Jefry Lesinski/Extender: Tito Dine in Treatment: 16 Active Problems Location of Pain Severity and Description of Pain Patient Has Paino No Site Locations With Dressing Change: No Pain Management and  Medication Current Pain Management: Electronic Signature(s) Signed: 02/26/2017 2:24:03 PM By: Regan Lemming BSN, RN Entered By: Regan Lemming on 02/26/2017 11:26:41 [name] [name] [name] ([id number]) -------------------------------------------------------------------------------- Patient/Caregiver Education Details Patient Name: Tallie, Sheelah V. Date of Service: 02/26/2017 11:15 AM Medical Record Patient Account Number: [id number] Number: Treating RN: Baruch Gouty, RN, BSN, Velva Harman [birth year] [phone] y.o. Other Clinician: Date of Birth/Gender: Female) Treating ROBSON, [name] Primary Care Physician: Beverlyn Roux Physician/Extender: G Referring Physician: Dorthula Nettles in Treatment: 16 Education Assessment Education Provided To: Patient Education Topics Provided Elevated Blood Sugar/ Impact on Healing: Methods: Explain/Verbal Responses: Reinforcements needed Welcome [place]: Methods: Explain/Verbal Responses: Reinforcements needed Electronic Signature(s) Signed: 02/26/2017 2:24:03 PM By: Regan Lemming BSN, RN Entered By: Regan Lemming on 02/26/2017 12:07:19 [name] [name] VMarland Kitchen ([id number]) -------------------------------------------------------------------------------- Wound Assessment Details Patient Name: [name] [name] V. Date of Service: 02/26/2017 11:15 AM Medical Record Number: [id number] Patient Account Number: [id number] Date of Birth/Sex: 09/12/1940 (76 y.o. Female) Treating RN: Afful, RN, BSN, Administrator, sports Primary Care Napolean Sia: Beverlyn Roux Other Clinician: Referring Meta Kroenke: Beverlyn Roux Treating Lynlee Stratton/Extender: Tito Dine in Treatment: 16 Wound Status Wound Number: 10 Primary Diabetic Wound/Ulcer of the Lower Etiology: Extremity Wound Location: Left Toe Great Wound Open Wounding Event: Gradually Appeared Status: Date Acquired: 02/04/2017 Comorbid Cataracts, Chronic sinus Weeks Of Treatment: 2 History: problems/congestion, Congestive Heart Clustered Wound:  No Failure, Hypertension, Type II Diabetes Photos Photo Uploaded By: Regan Lemming on 02/26/2017 13:03:14 Wound Measurements Length: (cm) 0.4 Width: (cm) 0.4 Depth: (cm) 0.1 Area: (cm) 0.126 Volume: (cm) 0.013 % Reduction in Area: 35.7% % Reduction in Volume: 35% Epithelialization: None Tunneling: No Undermining: No Wound Description Classification: Grade 1 Foul Odor Aft Wound Margin: Flat and Intact Slough/Fibrin Exudate Amount: Small Exudate Type: Serosanguineous Akhter, Kerry-Anne V. ([id number]) er Cleansing: No o No Exudate Color: red, brown Wound Bed Granulation Amount:  Large (67-100%) Exposed Structure Granulation Quality: Pink, Pale Fat Layer (Subcutaneous Tissue) Exposed: Yes Necrotic Amount: None Present (0%) Periwound Skin Texture Texture Color No Abnormalities Noted: No No Abnormalities Noted: No Callus: No Atrophie Blanche: No Crepitus: No Cyanosis: No Excoriation: No Ecchymosis: No Induration: No Erythema: No Rash: No Hemosiderin Staining: No Scarring: No Mottled: No Pallor: No Moisture Rubor: No No Abnormalities Noted: No Dry / Scaly: No Temperature / Pain Maceration: No Temperature: No Abnormality Wound Preparation Ulcer Cleansing: Rinsed/Irrigated with Saline Topical Anesthetic Applied: None Treatment Notes Wound #10 (Left Toe Great) 1. Cleansed with: Clean wound with Normal Saline 4. Dressing Applied: Other dressing (specify in notes) 5. Secondary Dressing Applied Dry Gauze Kerlix/Conform 7. Secured with Tape Notes endoform Electronic Signature(s) Signed: 02/26/2017 11:47:20 AM By: Regan Lemming BSN, RN Entered By: Regan Lemming on 02/26/2017 11:47:19 [name] [name] V. ([id number]) -------------------------------------------------------------------------------- Wound Assessment Details Patient Name: Strand, Abria V. Date of Service: 02/26/2017 11:15 AM Medical Record Number: [id number] Patient Account Number: [id number] Date of Birth/Sex:  1940/09/27 (76 y.o. Female) Treating RN: Afful, RN, BSN, Velva Harman Primary Care Ramata Strothman: Beverlyn Roux Other Clinician: Referring Willena Jeancharles: Beverlyn Roux Treating Shonita Rinck/Extender: Tito Dine in Treatment: 16 Wound Status Wound Number: 7 Primary Diabetic Wound/Ulcer of the Lower Etiology: Extremity Wound Location: Right Metatarsal head first - Dorsal Wound Open Status: Wounding Event: Gradually Appeared Comorbid Cataracts, Chronic sinus Date Acquired: 08/06/2013 History: problems/congestion, Congestive Heart Weeks Of Treatment: 16 Failure, Hypertension, Type II Diabetes Clustered Wound: No Photos Photo Uploaded By: Regan Lemming on 02/26/2017 13:03:15 Wound Measurements Length: (cm) 2.3 Width: (cm) 1 Depth: (cm) 1 Area: (cm) 1.806 Volume: (cm) 1.806 % Reduction in Area: -53.3% % Reduction in Volume: -411.6% Epithelialization: Small (1-33%) Tunneling: No Undermining: No Wound Description Classification: Grade 2 Wound Margin: Flat and Intact Exudate Amount: Large Exudate Type: Serous Chaddock, Tobi V. ([id number]) Foul Odor After Cleansing: No Slough/Fibrino Yes Exudate Color: amber Wound Bed Granulation Amount: Small (1-33%) Exposed Structure Granulation Quality: Pink, Friable Fascia Exposed: No Necrotic Amount: Medium (34-66%) Fat Layer (Subcutaneous Tissue) Exposed: Yes Necrotic Quality: Adherent Slough Tendon Exposed: No Muscle Exposed: Yes Necrosis of Muscle: No Joint Exposed: No Bone Exposed: Yes Periwound Skin Texture Texture Color No Abnormalities Noted: No No Abnormalities Noted: No Induration: Yes Ecchymosis: Yes Scarring: Yes Hemosiderin Staining: Yes Mottled: Yes Moisture No Abnormalities Noted: No Temperature / Pain Maceration: No Temperature: No Abnormality Tenderness on Palpation: Yes Wound Preparation Ulcer Cleansing: Rinsed/Irrigated with Saline, Other: soap and water, Topical Anesthetic Applied: Other: lidociane 4%, Treatment  Notes Wound #7 (Right, Dorsal Metatarsal head first) 1. Cleansed with: Clean wound with Normal Saline 4. Dressing Applied: Other dressing (specify in notes) 5. Secondary Dressing Applied Dry Gauze Kerlix/Conform 7. Secured with Tape Notes endoform Electronic Signature(s) Signed: 02/26/2017 12:15:21 PM By: Regan Lemming BSN, RN Entered By: Regan Lemming on 02/26/2017 12:15:20 Augenstein, Gregoria [name] ([id number]) -------------------------------------------------------------------------------- Vitals Details Patient Name: Gladhill, Roneisha V. Date of Service: 02/26/2017 11:15 AM Medical Record Number: [id number] Patient Account Number: [id number] Date of Birth/Sex: 1941/06/17 (76 y.o. Female) Treating RN: Afful, RN, BSN, Velva Harman Primary Care Robbie Nangle: Beverlyn Roux Other Clinician: Referring Tavionna Grout: Beverlyn Roux Treating Jestine Bicknell/Extender: Tito Dine in Treatment: 16 Vital Signs Time Taken: 11:35 Temperature (F): 98 Height (in): 64 Pulse (bpm): 71 Weight (lbs): 200 Respiratory Rate (breaths/min): 16 Body Mass Index (BMI): 34.3 Blood Pressure (mmHg): 146/56 Reference Range: 80 - 120 mg / dl Electronic Signature(s) Signed: 02/26/2017 2:24:03 PM By: Regan Lemming BSN, RN Entered ByBaruch Gouty,  Rita on 02/26/2017 11:36:08

## 2017-02-28 NOTE — Progress Notes (Signed)
[name], [name] ([id number]) Visit Report for 02/26/2017 Chief Complaint Document Details Patient Name: [name], [name] Date of Service: 02/26/2017 11:15 AM Medical Record Patient Account Number: [id number] Number: Treating RN: Baruch Gouty, RN, BSN, Velva Harman 12-08-1940 [phone] y.o. Other Clinician: Date of Birth/Sex: Female) Treating Bertha Earwood Primary Care Provider: Beverlyn Roux Provider/Extender: G Referring Provider: Dorthula Nettles in Treatment: 16 Information Obtained from: Patient Chief Complaint The patient is here for initial evaluation of RLE and Right foot/toe ulcers Electronic Signature(s) Signed: 02/26/2017 6:25:53 PM By: Linton Ham MD Entered By: Linton Ham on 02/26/2017 12:31:33 Flatley, Blayke [name] ([id number]) -------------------------------------------------------------------------------- Debridement Details Patient Name: [name], [name] Date of Service: 02/26/2017 11:15 AM Medical Record Patient Account Number: [id number] Number: Treating RN: Baruch Gouty, RN, BSN, Velva Harman 08/29/1940 [phone] y.o. Other Clinician: Date of Birth/Sex: Female) Treating Erroll Wilbourne, [place] Primary Care Provider: Beverlyn Roux Provider/Extender: G Referring Provider: Dorthula Nettles in Treatment: 16 Debridement Performed for Wound #7 Right,Dorsal Metatarsal head first Assessment: Performed By: Physician Ricard Dillon, MD Debridement: Debridement Severity of Tissue Pre Fat layer exposed Debridement: Pre-procedure Verification/Time Out Yes - 11:49 Taken: Start Time: 11:49 Pain Control: Lidocaine 4% Topical Solution Level: Skin/Subcutaneous Tissue Total Area Debrided (L x 2.3 (cm) x 1 (cm) = 2.3 (cm) W): Tissue and other Non-Viable, Fat, Fibrin/Slough, Subcutaneous material debrided: Instrument: Curette Bleeding: Minimum Hemostasis Achieved: Pressure End Time: 11:53 Procedural Pain: 0 Post Procedural Pain: 0 Response to Treatment: Procedure was tolerated  well Post Debridement Measurements of Total Wound Length: (cm) 2.3 Width: (cm) 1 Depth: (cm) 1 Volume: (cm) 1.806 Character of Wound/Ulcer Post Requires Further Debridement Debridement: Severity of Tissue Post Debridement: Fat layer exposed Post Procedure Diagnosis Same as Pre-procedure Electronic Signature(s) AUBRYN, SPINOLA ([id number]) Signed: 02/26/2017 2:24:03 PM By: Regan Lemming BSN, RN Signed: 02/26/2017 6:25:53 PM By: Linton Ham MD Entered By: Linton Ham on 02/26/2017 12:31:20 Knaus, Paiten [name] ([id number]) -------------------------------------------------------------------------------- HPI Details Patient Name: [name], [name] Date of Service: 02/26/2017 11:15 AM Medical Record Patient Account Number: [id number] Number: Treating RN: Baruch Gouty, RN, BSN, Velva Harman [birth year] [phone] y.o. Other Clinician: Date of Birth/Sex: Female) Treating Arnella Pralle Primary Care Provider: Beverlyn Roux Provider/Extender: G Referring Provider: Dorthula Nettles in Treatment: 16 History of Present Illness HPI Description: 08/13/16: This is a 76 year old woman who came predominantly for review of 3 cm in diameter circular wound to the left anterior lateral leg. She was in the ER on 08/01/16 I reviewed their notes. There was apparently pus coming out of the wound at that time and the patient arrived requesting debridement which they don't do in the emergency room. Nevertheless I can't see that they did any x-rays. There were no cultures done. She is a type II diabetic and I a note after the patient was in the clinic that she had a bypass graft from the popliteal to the tibial on the right on 02/28/16. She also had a right greater saphenous vein harvest on the same date for arterial bypass. She is going to have vascular studies including ABIs T ABIs on the right on 08/28/16. The patient's surgery was on 02/28/16 by Dr. Vallarie Mare she had a right below the knee popliteal artery to peroneal artery  bypass with reverse greater saphenous vein and an endarterectomy of the mid segment peroneal artery. Postoperatively she had a strong mild monophasic peroneal signal with a pink foot. It would appear that the patient is had some nonhealing in the surgical saphenous vein harvest site on the left leg.  [facility]prisingly [facility] I cannot see much information about this at all. Dr. Lucious Groves notes from 05/29/16 show that the patient's wounds "are not healed" the right first metatarsal wound healed but then opened back up. 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 At that point the patient had a wound VAC to her right calf with regards to the wounds on her dorsal right toe would appear that these are felt to be arterial wounds. She has had surgery on the metatarsal phalangeal in 2015 I Dr. Doran Durand secondary to a right metatarsal phalangeal joint fracture. She is apparently had discoloration around this area since then. 08/28/16; patient arrives with her wounds in much the same condition. The linear vein harvest site and the circular wound below it which I think was a blister. She also has to probing holes in her right great toe and a necrotic eschar on the right second toe. Because of these being arterial wounds I reduced her compression from 3-2 layers this seems to of done satisfactorily she has not had any problems. I cannot see that she is actually had an x-ray ====== 11/06/16 the patient comes in for evaluation of her right lower extremity ulcers. She was here in January 2018 for 2 visits subsequently ended up in the hospital with pneumonia and then to rehabilitation. She has now been discharged from rehabilitation and is home. She has multiple ulcerations to her right lower extremity including the foot and toes. She does have home health in place and they have been placing  alginate to the ulcers. She is followed by Dr. Bridgett Larsson of vascular medicine. 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 She recently saw him on 3/23. In office ABIs were: Pehl, TIANAH LONARDO. ([id number]) Right 0.48 with monophasic flow to the DP, PT, peroneal Left 0.63 with monophasic flow to the DP and PT Her arterial studies indicated a patent right below knee popliteal to peroneal bypass She had an MRI in February 2008 that was negative frosty myelitis but this showed general soft tissue edema in the right foot and lower extremity concerning for cellulitis She is a diabetic, managed with insulin. Her hemoglobin A1c in December 2017 was 8.4 which is a trend up from previous levels. She had blood work in February 2018 which revealed an albumin of 2.6 this appears to be relatively acute as an albumin in November 2017 was 3.7 11/13/16; this is a patient I have not seen since February who is readmitted to our clinic last week. 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 Bridgett Larsson. I have reviewed Dr. Lianne Moris notes from March/23/18. Doppler ABI on that date showed an ABI on the right of 0.48 and on the left of 0.63. Dorsalis pedis waveforms were monophasic bilaterally. There was no waveforms detected at the posterior tibial on the right, monophasic on the left. Dr. Lianne Moris comments were that this patient would have follow-up vascular studies in 3 months including ABIs and right lower extremity arterial duplex. She had an MRI in February that was negative for osteomyelitis but showed generalized edema in the foot. Last albumin I see was in January at 3.4 we have been using Santyl to the 4 wounds on the right leg the patient is noted today to have widespread  edema well up towards her groin this is pitting 2-3+. I reviewed her echocardiogram done in January which showed calcific  aortic stenosis mild to moderate. Normal ejection fraction. 11/20/16; patient has a follow-up appointment with Dr. Bridgett Larsson on April 23. She is still complaining of a lot of pain in the right foot and right leg. It is not clear to me that this is at all positional however I think it is clear claudication with minimal activity perhaps at rest. At our suggestion she is return to her primary physician's office tomorrow with regards to her pitting lower extremity bilateral edema that I reviewed in detail last week 11/27/16; the follow-up with Dr. Bridgett Larsson was actually on May 23 on April 23 as I stated in my note last week. N/A case all of her wounds seems somewhat smaller. The 2 on the right leg are definitely smaller. The areas on the dorsal right first toe, right third toe and the lateral part of the right fifth metatarsal head all looks smaller but have tightly adherent surfaces. We have been using Santyl 12/04/16; follow-up with Dr. Bridgett Larsson on May 23. 2 small open wounds on the right leg continue to get smaller. The area on the right third total lateral aspect of the right fifth toe also look better. The remaining area on the dorsal first toe still has some depth to it. We have been using Santyl to the toes and collagen on the right 12/11/16; according to patient's husband the follow-up with Dr. Bridgett Larsson is not until July. All of the wounds on the right leg are measuring smaller. We have been using some combination of Prisma and Santyl although I think we can go to straight Prisma today. There may have been some confusion with home health about the primary dressing orders here. 12/25/16; the patient has had some healing this week. The area on her right lateral fifth metatarsal head, right third toe are both healed lower right leg is healed. In the vein harvest site superiorly she has one superficial open area. On the dorsal aspect of her right great toe/MTP joint the wound is now divided into 2 however the proximal  area is deep and there is palpable bone 01/01/17; still an open area in the middle of her original right surgical scar. The area on the right third toe and right lateral fifth metatarsal head remained closed. Problematic area on the dorsal aspect of the toe. Previous surgery in this area line 01/08/17 small open area on the original scar on her upper anterior leg although this is closing. X-ray I did of the right first toe did not show underlying bony abnormality. Still this area on the dorsal first toe probes to bone. We have been using Prisma 01/15/17; small open area in the original scar in the upper anterior leg is almost fully closed. She has 2 open areas over the dorsal aspect of the right first toe that probes to bone. Used and a form starting last week. Vigorous bone scraping that I did last week showed few methicillin sensitive staph aureus. She is allergic to Shoemaker, Ahmyah V. ([id number]) penicillin and sulfa drugs. I'm going to give her 2 weeks of doxycycline. She will need an MRI with contrast. 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 She continues on doxycycline for a bone scraping that showed methicillin sensitive staph aureus [allergic to penicillin and sulfa]. We have been using  Endo form to the wound. 01/29/17; surprisingly her MRI did not show osteomyelitis. She continues on doxycycline for a bone scraping that showed methicillin sensitive staph aureus with allergies to penicillin and sulfa. We have been using Endo form to the wound. Unfortunately I cannot get a surface on this visit looks like it is able to support a healing state. Proximally there is still exposed bone. There is no overt soft tissue tenderness. Her MRI did show a previous fracture was surgery to this area but no hardware 02/12/17 on evaluation today patient appears to be doing well in regard to her lower extremity wound. She does have  some mild discomfort but this is minimal. There's no evidence of infection. Her left great toe nail has somewhat been lifted up and there is a little bit of slight bleeding underneath but this is still firmly attached. 02/19/17; she ran out of doxycycline 2 days ago. Nevertheless I would like to continue this for 2 weeks to make a full 6 weeks of therapy as the bone scraping that I did from the open area on the dorsal right first toe showed MRSA. This should complete antibiotic therapy. 02/26/17; the patient is completing her 6 weeks of oral doxycycline. Deep wound on the dorsal right toe. This still has some exposed bone proximally. She does have a reasonably granulated surface albeit thin over bone. I've been using Endo form have made application for an amniotic skin sub Electronic Signature(s) Signed: 02/26/2017 6:25:53 PM By: Linton Ham MD Entered By: Linton Ham on 02/26/2017 12:36:45 Raider, Nikesha [name] ([id number]) -------------------------------------------------------------------------------- Physical Exam Details Patient Name: [name], [name] Date of Service: 02/26/2017 11:15 AM Medical Record Patient Account Number: [id number] Number: Treating RN: Baruch Gouty, RN, BSN, Velva Harman [birth year] [phone] y.o. Other Clinician: Date of Birth/Sex: Female) Treating Emmaus Brandi Primary Care Provider: Beverlyn Roux Provider/Extender: G Referring Provider: Dorthula Nettles in Treatment: 61 Constitutional Patient is hypertensive.. Pulse regular and within target range for patient.[name] Respirations regular, non-labored and within target range.. Temperature is normal and within the target range for the patient.[name] appears in no distress. Eyes Conjunctivae clear. No discharge. Notes Wound exam; right great toe dorsally. Proximal wound still exposed bone. There is slough over her thin surface over the rest the wound. Using a #3 curet I remove this and nonviable tissue. There is no  overt infection Electronic Signature(s) Signed: 02/26/2017 6:25:53 PM By: Linton Ham MD Entered By: Linton Ham on 02/26/2017 12:37:50 Mccort, Ziah [name] ([id number]) -------------------------------------------------------------------------------- Physician Orders Details Patient Name: Berhane, Margel V. Date of Service: 02/26/2017 11:15 AM Medical Record Patient Account Number: [id number] Number: Treating RN: Baruch Gouty, RN, BSN, Velva Harman 09/08/1940 [phone] y.o. Other Clinician: Date of Birth/Sex: Female) Treating Meira Wahba Primary Care Provider: Beverlyn Roux Provider/Extender: G Referring Provider: Dorthula Nettles in Treatment: 70 Verbal / Phone Orders: No Diagnosis Coding Wound Cleansing Wound #7 Right,Dorsal Metatarsal head first o Clean wound with Normal Saline. o Cleanse wound with mild soap and water Anesthetic Wound #7 Right,Dorsal Metatarsal head first o Topical Lidocaine 4% cream applied to wound bed prior to debridement Primary Wound Dressing Wound #10 Left Toe Great o Other: - endoform Wound #7 Right,Dorsal Metatarsal head first o Other: - ENDOFORM Secondary Dressing Wound #10 Left Toe Great o Dry Gauze o Conform/Kerlix Wound #7 Right,Dorsal Metatarsal head first o Dry Gauze o Conform/Kerlix Dressing Change Frequency Wound #7 Right,Dorsal Metatarsal head first o Change dressing every other day. Follow-up Appointments Wound #10 Left Toe Great o Return Appointment in  1 week. Wound #7 Right,Dorsal Metatarsal head first Lung, Coraleigh V. ([id number]) o Return Appointment in 1 week. Edema Control Wound #10 Left Toe Great o Elevate legs to the level of the heart and pump ankles as often as possible Wound #7 Right,Dorsal Metatarsal head first o Elevate legs to the level of the heart and pump ankles as often as possible Additional Orders / Instructions Wound #7 Right,Dorsal Metatarsal head first o Increase protein  intake. o Activity as tolerated Home Health Wound #10 Left [place] Visits - [facility] weekly, [facility]-Wednesday o Home Health Nurse may visit PRN to address patientos wound care needs. o FACE TO FACE ENCOUNTER: MEDICARE and MEDICAID PATIENTS: I certify that this patient is under my care and that I had a face-to-face encounter that meets the physician face-to-face encounter requirements with this patient on this date. The encounter with the patient was in whole or in part for the following MEDICAL CONDITION: (primary reason for [place]) MEDICAL NECESSITY: I certify, that based on my findings, NURSING services are a medically necessary home health service. HOME BOUND STATUS: I certify that my clinical findings support that this patient is homebound (i.e., Due to illness or injury, pt requires aid of supportive devices such as crutches, cane, wheelchairs, walkers, the use of special transportation or the assistance of another person to leave their place of residence. There is a normal inability to leave the home and doing so requires considerable and taxing effort. Other absences are for medical reasons / religious services and are infrequent or of short duration when for other reasons). o If current dressing causes regression in wound condition, may D/C ordered dressing product/s and apply Normal Saline Moist Dressing daily until next [place] / Other MD appointment. [place] of regression in wound condition at [phone]. o Please direct any NON-WOUND related issues/requests for orders to patient's Primary Care Physician Wound #7 Right,Dorsal Metatarsal head first o [place] Visits - [facility] twice weekly, [facility]-Wednesday o Home Health Nurse may visit PRN to address patientos wound care needs. o FACE TO FACE ENCOUNTER: MEDICARE and MEDICAID PATIENTS: I certify that  this patient is under my care and that I had a face-to-face encounter that meets the physician face-to-face encounter requirements with this patient on this date. The encounter with the patient was in whole or in part for the following MEDICAL CONDITION: (primary reason for [place]) MEDICAL NECESSITY: I certify, that based on my findings, NURSING services are a medically necessary home health service. HOME BOUND STATUS: I certify that my clinical findings support that this patient is homebound (i.e., Due to illness or injury, pt requires aid of supportive devices such as crutches, cane, wheelchairs, walkers, the use of special transportation or the assistance of another person to leave their place of residence. There is a Rowland, Dalayah V. ([id number]) normal inability to leave the home and doing so requires considerable and taxing effort. Other absences are for medical reasons / religious services and are infrequent or of short duration when for other reasons). o If current dressing causes regression in wound condition, may D/C ordered dressing product/s and apply Normal Saline Moist Dressing daily until next [place] / Other MD appointment. [place] of regression in wound condition at [phone]. o Please direct any NON-WOUND related issues/requests for orders to patient's Primary Care Physician Medications-please add to medication list. Wound #10 Left Toe Great o P.O.  Antibiotics - Doxy 100mg  BID Wound #7 Right,Dorsal Metatarsal head first o P.O. Antibiotics - Doxy 100mg  BID Electronic Signature(s) Signed: 02/26/2017 2:24:03 PM By: Regan Lemming BSN, RN Signed: 02/26/2017 6:25:53 PM By: Linton Ham MD Entered By: Regan Lemming on 02/26/2017 11:52:32 Tribbett, Jaiyanna [name] ([id number]) -------------------------------------------------------------------------------- Problem List Details Patient Name: [name], [name] Date of Service: 02/26/2017 11:15  AM Medical Record Patient Account Number: [id number] Number: Treating RN: Baruch Gouty, RN, BSN, Velva Harman [birth year] [phone] y.o. 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 Date of Service: 02/26/2017 11:15 AM Medical Record Patient Account Number: [id number] Number: Treating RN: Baruch Gouty, RN, BSN, Velva Harman 02/27/1941 [phone] y.o. Other Clinician: Date of Birth/Sex: Female) Treating Cearra Portnoy Primary Care Provider: Beverlyn Roux Provider/Extender: G Referring Provider: Dorthula Nettles in Treatment: 16 Subjective Chief Complaint Information obtained from Patient The patient is here for initial evaluation of RLE and Right foot/toe ulcers History of Present Illness (HPI) 08/13/16: This is a 76 year old woman who  came predominantly for review of 3 cm in diameter circular wound to the left anterior lateral leg. She was in the ER on 08/01/16 I reviewed their notes. There was apparently pus coming out of the wound at that time and the patient arrived requesting debridement which they don't do in the emergency room. Nevertheless I can't see that they did any x-rays. There were no cultures done. She is a type II diabetic and I a note after the patient was in the clinic that she had a bypass graft from the popliteal to the tibial on the right on 02/28/16. She also had a right greater saphenous vein harvest on the same date for arterial bypass. She is going to have vascular studies including ABIs T ABIs on the right on 08/28/16. The patient's surgery was on 02/28/16 by Dr. Vallarie Mare she had a right below the knee popliteal artery to peroneal artery bypass with reverse greater saphenous vein and an endarterectomy of the mid segment peroneal artery. Postoperatively she had a strong mild monophasic peroneal signal with a pink foot. It would appear that the patient is had some nonhealing in the surgical saphenous vein harvest site on the left leg. Surprisingly [facility] I cannot see much information about this at all. Dr. Lucious Groves notes from 05/29/16 show that the patient's wounds "are not healed" the right first metatarsal wound healed but then opened back up. 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 At that point the patient had a wound VAC to her right  calf with regards to the wounds on her dorsal right toe would appear that these are felt to be arterial wounds. She has had surgery on the metatarsal phalangeal in 2015 I Dr. Doran Durand secondary to a right metatarsal phalangeal joint fracture. She is apparently had discoloration around this area since then. 08/28/16; patient arrives with her wounds in  much the same condition. The linear vein harvest site and the circular wound below it which I think was a blister. She also has to probing holes in her right great toe and a necrotic eschar on the right second toe. Because of these being arterial wounds I reduced her compression from 3-2 layers this seems to of done satisfactorily she has not had any problems. I cannot see that she is actually had an x-ray ====== 11/06/16 the patient comes in for evaluation of her right lower extremity ulcers. She was here in January 2018 for 2 visits subsequently ended up in the hospital with pneumonia and then to rehabilitation. She has now been Harvell, [name] ([id number]) discharged from rehabilitation and is home. She has multiple ulcerations to her right lower extremity including the foot and toes. She does have home health in place and they have been placing alginate to the ulcers. She is followed by Dr. Bridgett Larsson of vascular medicine. 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 She recently saw him on 3/23. In office ABIs were: Right 0.48 with monophasic flow to the DP, PT, peroneal Left 0.63 with monophasic flow to the DP and PT Her arterial studies indicated a patent right below knee popliteal to peroneal bypass She had an MRI in February 2008 that was negative frosty myelitis but this showed general soft tissue edema in the right foot and lower extremity concerning for cellulitis She is a diabetic, managed with insulin. Her hemoglobin A1c in December 2017 was 8.4 which is a trend up from previous levels. She had blood work in February 2018 which revealed an albumin of 2.6 this appears to be relatively acute as an albumin in November 2017 was 3.7 11/13/16; this is a patient I have not seen since February who is readmitted to our clinic last week. 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 Bridgett Larsson. I have reviewed Dr. Lianne Moris notes from March/23/18. Doppler ABI on that date showed an ABI on the right of 0.48 and on the left of 0.63. Dorsalis pedis waveforms were monophasic bilaterally. There was no waveforms detected at the posterior tibial on the right, monophasic on the left. Dr. Lianne Moris comments were that this patient would have follow-up vascular studies in 3 months including ABIs and right lower extremity arterial duplex. She had an MRI in February that was negative for osteomyelitis but showed generalized edema in the foot. Last albumin I see was in January at 3.4 we have been using Santyl to the 4 wounds on the right leg the patient is noted today to have widespread edema well up towards her groin this is pitting 2-3+. 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 Normal ejection fraction. 11/20/16; patient has a follow-up appointment with Dr. Bridgett Larsson on April 23. She is still complaining of a lot of pain in the right foot and right leg. It is not clear to me that this is at all positional however I think  it is clear claudication with minimal activity perhaps at rest. At our suggestion she is return to her primary physician's office tomorrow with regards to her pitting lower extremity bilateral edema that I reviewed in detail last week 11/27/16; the follow-up with Dr. Bridgett Larsson was actually on May 23 on April 23 as I stated in my note last week. N/A case all of her wounds seems somewhat smaller. The 2 on the right leg are definitely smaller. The areas on the dorsal right first toe, right third toe and the lateral part of the right fifth metatarsal head all looks smaller but have tightly adherent surfaces. We have been using Santyl 12/04/16; follow-up with Dr. Bridgett Larsson on May 23. 2 small open wounds on the right leg continue to get smaller. The area on the right third total lateral aspect of the right fifth toe also look  better. The remaining area on the dorsal first toe still has some depth to it. We have been using Santyl to the toes and collagen on the right 12/11/16; according to patient's husband the follow-up with Dr. Bridgett Larsson is not until July. All of the wounds on the right leg are measuring smaller. We have been using some combination of Prisma and Santyl although I think we can go to straight Prisma today. There may have been some confusion with home health about the primary dressing orders here. 12/25/16; the patient has had some healing this week. The area on her right lateral fifth metatarsal head, right third toe are both healed lower right leg is healed. In the vein harvest site superiorly she has one superficial open area. On the dorsal aspect of her right great toe/MTP joint the wound is now divided into 2 however the proximal area is deep and there is palpable bone 01/01/17; still an open area in the middle of her original right surgical scar. The area on the right third toe and right lateral fifth metatarsal head remained closed. Problematic area on the dorsal aspect of the toe. Previous surgery in this area line 01/08/17 small open area on the original scar on her upper anterior leg although this is closing. X-ray I did of [name], [name] ([id number]) the right first toe did not show underlying bony abnormality. Still this area on the dorsal first toe probes to bone. We have been using Prisma 01/15/17; small open area in the original scar in the upper anterior leg is almost fully closed. She has 2 open areas over the dorsal aspect of the right first toe that probes to bone. Used and a form starting last week. Vigorous bone scraping that I did last week showed few methicillin sensitive staph aureus. She is allergic to penicillin and sulfa drugs. I'm going to give her 2 weeks of doxycycline. She will need an MRI with contrast. 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 She continues on doxycycline for a bone scraping that showed methicillin sensitive staph aureus [allergic to penicillin and sulfa]. We have been using Endo form to the wound. 01/29/17; surprisingly her MRI did not show osteomyelitis. She continues on doxycycline for a bone scraping that showed methicillin sensitive staph aureus with allergies to penicillin and sulfa. We have been using Endo form to the wound. Unfortunately I cannot get a surface on this visit looks like it is able to support a healing state. Proximally there is still exposed bone. There is no overt  soft tissue tenderness. Her MRI did show a previous fracture was surgery to this area but no hardware 02/12/17 on evaluation today patient appears to be doing well in regard to her lower extremity wound. She does have some mild discomfort but this is minimal. There's no evidence of infection. Her left great toe nail has somewhat been lifted up and there is a little bit of slight bleeding underneath but this is still firmly attached. 02/19/17; she ran out of doxycycline 2 days ago. Nevertheless I would like to continue this for 2 weeks to make a full 6 weeks of therapy as the bone scraping that I did from the open area on the dorsal right first toe showed MRSA. This should complete antibiotic therapy. 02/26/17; the patient is completing her 6 weeks of oral doxycycline. Deep wound on the dorsal right toe. This still has some exposed bone proximally. She does have a reasonably granulated surface albeit thin over bone. I've been using Endo form have made application for an amniotic skin sub Objective Constitutional Patient is hypertensive.. Pulse regular and within target range for patient.[name] Respirations regular, non-labored and within target range.. Temperature is normal and within the target range for the patient.[name] appears in no distress. Vitals Time Taken: 11:35 AM, Height: 64 in,  Weight: 200 lbs, BMI: 34.3, Temperature: 98 F, Pulse: 71 bpm, Respiratory Rate: 16 breaths/min, Blood Pressure: 146/56 mmHg. Eyes Conjunctivae clear. No discharge. General Notes: Wound exam; right great toe dorsally. Proximal wound still exposed bone. There is slough Kunath, Rhemi V. ([id number]) over her thin surface over the rest the wound. Using a #3 curet I remove this and nonviable tissue. There is no overt infection Integumentary (Hair, Skin) Wound #10 status is Open. Original cause of wound was Gradually Appeared. The wound is located on the Left Toe Great. The wound measures 0.4cm length x 0.4cm width x 0.1cm depth; 0.126cm^2 area and 0.013cm^3 volume. There is Fat Layer (Subcutaneous Tissue) Exposed exposed. There is no tunneling or undermining noted. There is a small amount of serosanguineous drainage noted. The wound margin is flat and intact. There is large (67-100%) pink, pale granulation within the wound bed. There is no necrotic tissue within the wound bed. The periwound skin appearance did not exhibit: Callus, Crepitus, Excoriation, Induration, Rash, Scarring, Dry/Scaly, Maceration, Atrophie Blanche, Cyanosis, Ecchymosis, Hemosiderin Staining, Mottled, Pallor, Rubor, Erythema. Periwound temperature was noted as No Abnormality. Wound #7 status is Open. Original cause of wound was Gradually Appeared. The wound is located on the Right,Dorsal Metatarsal head first. The wound measures 2.3cm length x 1cm width x 1cm depth; 1.806cm^2 area and 1.806cm^3 volume. There is bone, muscle, and Fat Layer (Subcutaneous Tissue) Exposed exposed. There is no tunneling or undermining noted. There is a large amount of serous drainage noted. The wound margin is flat and intact. There is small (1-33%) pink, friable granulation within the wound bed. There is a medium (34-66%) amount of necrotic tissue within the wound bed including Adherent Slough. The periwound skin appearance exhibited: Induration,  Scarring, Ecchymosis, Hemosiderin Staining, Mottled. The periwound skin appearance did not exhibit: Maceration. Periwound temperature was noted as No Abnormality. The periwound has tenderness on palpation. 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 There was a Skin/Subcutaneous Tissue Debridement ([id number]) debridement with total area of 2.3 sq cm performed Claus, Anamari V. ([id number]) by Ricard Dillon, MD. with the following instrument(s): Curette to remove Non-Viable tissue/material including Fat Layer (and Subcutaneous Tissue) Exposed, Fibrin/Slough, and Subcutaneous after achieving pain control using Lidocaine 4% Topical Solution. A time out was conducted at 11:49, prior to the start of the procedure. A Minimum amount of bleeding was controlled with Pressure. The procedure was tolerated well with a pain level of 0 throughout and a pain level of 0 following the procedure. Post Debridement Measurements: 2.3cm length x 1cm width x 1cm depth; 1.806cm^3 volume. Character of Wound/Ulcer Post Debridement requires further debridement. Severity of Tissue Post Debridement is: Fat layer exposed. Post procedure Diagnosis Wound #7: Same as  Pre-Procedure Plan Wound Cleansing: Wound #7 Right,Dorsal Metatarsal head first: Clean wound with Normal Saline. Cleanse wound with mild soap and water Anesthetic: Wound #7 Right,Dorsal Metatarsal head first: Topical Lidocaine 4% cream applied to wound bed prior to debridement Primary Wound Dressing: Wound #10 Left Toe Great: Other: - endoform Wound #7 Right,Dorsal Metatarsal head first: Other: - ENDOFORM Secondary Dressing: Wound #10 Left Toe Great: Dry Gauze Conform/Kerlix Wound #7 Right,Dorsal Metatarsal head first: Dry Gauze Conform/Kerlix Dressing Change Frequency: Wound #7 Right,Dorsal Metatarsal head first: Change dressing every other day. Follow-up Appointments: Wound #10 Left Toe Great: Return Appointment in 1 week. Wound #7 Right,Dorsal Metatarsal head first: Return Appointment in 1 week. Edema Control: Wound #10 Left Toe Great: Elevate legs to the level of the heart and pump ankles as often as possible Wound #7 Right,Dorsal Metatarsal head first: Elevate legs to the level of the heart and pump ankles as often as possible Additional Orders / Instructions: Lantigua, Zalea V. ([id number]) Wound #7 Right,Dorsal Metatarsal head first: Increase protein intake. Activity as tolerated Home Health: Wound #10 Left Toe Great: Continue Home Health Visits - [facility] twice weekly, [facility] Nurse may visit [facility] to address patient s wound care needs. FACE TO FACE ENCOUNTER: MEDICARE and MEDICAID PATIENTS: I certify that this patient is under my care and that I had a face-to-face encounter that meets the physician face-to-face encounter requirements with this patient on this date. The encounter with the patient was in whole or in part for the following MEDICAL CONDITION: (primary reason for [place]) MEDICAL NECESSITY: I certify, that based on my findings, NURSING services are a medically necessary home health service. HOME BOUND STATUS: I  certify that my clinical findings support that this patient is homebound (i.e., Due to illness or injury, pt requires aid of supportive devices such as crutches, cane, wheelchairs, walkers, the use of special transportation or the assistance of another person to leave their place of residence. There is a normal inability to leave the home and doing so requires considerable and taxing effort. Other absences are for medical reasons / religious services and are infrequent or of short duration when for other reasons). If current dressing causes regression in wound condition, may D/C ordered dressing product/s and apply Normal Saline Moist Dressing daily until next [place] / Other  MD appointment. [place] of regression in wound condition at [phone]. Please direct any NON-WOUND related issues/requests for orders to patient's Primary Care Physician Wound #7 Right,Dorsal Metatarsal head first: [place] Visits - [facility] twice weekly, [facility] Nurse may visit [facility] to address patient s wound care needs. FACE TO FACE ENCOUNTER: MEDICARE and MEDICAID PATIENTS: I certify that this patient is under my care and that I had a face-to-face encounter that meets the physician face-to-face encounter requirements with this patient on this date. The encounter with the patient was in whole or in part for the following MEDICAL CONDITION: (primary reason for [place]) MEDICAL NECESSITY: I certify, that based on my findings, NURSING services are a medically necessary home health service. HOME BOUND STATUS: I certify that my clinical findings support that this patient is homebound (i.e., Due to illness or injury, pt requires aid of supportive devices such as crutches, cane, wheelchairs, walkers, the use of special transportation or the assistance of another person to leave their place of residence. There is a normal inability to leave the  home and doing so requires considerable and taxing effort. Other absences are for medical reasons / religious services and are infrequent or of short duration when for other reasons). If current dressing causes regression in wound condition, may D/C ordered dressing product/s and apply Normal Saline Moist Dressing daily until next [place] / Other MD appointment. [place] of regression in wound condition at [phone]. Please direct any NON-WOUND related issues/requests for orders to patient's Primary Care Physician Medications-please add to medication list.: Wound #10 Left Toe Great: P.O. Antibiotics - Doxy 100mg  BID Wound #7 Right,Dorsal Metatarsal head first: P.O. Antibiotics - Doxy 100mg  BID #1 continue Endoform. Zartman, Katelynd V. ([id number]) #2 finished doxycycline #3 we have made application for Affinity, we will have no options to heal this without stimulating her scant amount of granulation #4 patient has PAD. We may need to have a repeat vascular eval Electronic Signature(s) Signed: 02/26/2017 6:25:53 PM By: Linton Ham MD Entered By: Linton Ham on 02/26/2017 12:40:31 Chamberland, Celica V. ([id number]) -------------------------------------------------------------------------------- SuperBill Details Patient Name: [name], [name] Date of Service: 02/26/2017 Medical Record Patient Account Number: [id number] Number: Treating RN: Baruch Gouty, RN, BSN, Velva Harman 01-01-1941 [phone] y.o. 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 I70.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 of foot Facility Procedures CPT4: Description Modifier Quantity Code [id number] 11042 - DEB SUBQ TISSUE 20 SQ CM/< 1 ICD-10 Description Diagnosis 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

## 2017-03-04 ENCOUNTER — Other Ambulatory Visit: Payer: Self-pay

## 2017-03-04 MED ORDER — CLOPIDOGREL BISULFATE 75 MG PO TABS: 75.0000 mg | ORAL_TABLET | Freq: Every day | ORAL | 0 refills | Status: DC

## 2017-03-05 ENCOUNTER — Encounter: Payer: Medicare HMO | Attending: Internal Medicine | Admitting: Internal Medicine

## 2017-03-05 DIAGNOSIS — E11622 Type 2 diabetes mellitus with other skin ulcer: Secondary | ICD-10-CM | POA: Diagnosis present

## 2017-03-05 DIAGNOSIS — E11621 Type 2 diabetes mellitus with foot ulcer: Secondary | ICD-10-CM | POA: Insufficient documentation

## 2017-03-05 DIAGNOSIS — Z880 Allergy status to penicillin: Secondary | ICD-10-CM | POA: Insufficient documentation

## 2017-03-05 DIAGNOSIS — Z8673 Personal history of transient ischemic attack (TIA), and cerebral infarction without residual deficits: Secondary | ICD-10-CM | POA: Insufficient documentation

## 2017-03-05 DIAGNOSIS — L97219 Non-pressure chronic ulcer of right calf with unspecified severity: Secondary | ICD-10-CM | POA: Insufficient documentation

## 2017-03-05 DIAGNOSIS — E785 Hyperlipidemia, unspecified: Secondary | ICD-10-CM | POA: Diagnosis not present

## 2017-03-05 DIAGNOSIS — Z794 Long term (current) use of insulin: Secondary | ICD-10-CM | POA: Insufficient documentation

## 2017-03-05 DIAGNOSIS — I509 Heart failure, unspecified: Secondary | ICD-10-CM | POA: Diagnosis not present

## 2017-03-05 DIAGNOSIS — E43 Unspecified severe protein-calorie malnutrition: Secondary | ICD-10-CM | POA: Insufficient documentation

## 2017-03-05 DIAGNOSIS — L97519 Non-pressure chronic ulcer of other part of right foot with unspecified severity: Secondary | ICD-10-CM | POA: Diagnosis not present

## 2017-03-05 DIAGNOSIS — Z91012 Allergy to eggs: Secondary | ICD-10-CM | POA: Insufficient documentation

## 2017-03-05 DIAGNOSIS — Z91013 Allergy to seafood: Secondary | ICD-10-CM | POA: Insufficient documentation

## 2017-03-05 DIAGNOSIS — D649 Anemia, unspecified: Secondary | ICD-10-CM | POA: Diagnosis not present

## 2017-03-05 DIAGNOSIS — E1152 Type 2 diabetes mellitus with diabetic peripheral angiopathy with gangrene: Secondary | ICD-10-CM | POA: Insufficient documentation

## 2017-03-05 DIAGNOSIS — Z882 Allergy status to sulfonamides status: Secondary | ICD-10-CM | POA: Insufficient documentation

## 2017-03-05 DIAGNOSIS — I70232 Atherosclerosis 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 of calf: Secondary | ICD-10-CM | POA: Insufficient documentation

## 2017-03-05 DIAGNOSIS — I70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 of foot: Secondary | ICD-10-CM | POA: Diagnosis not present

## 2017-03-05 DIAGNOSIS — Z888 Allergy status to other drugs, medicaments and biological substances status: Secondary | ICD-10-CM | POA: Insufficient documentation

## 2017-03-05 DIAGNOSIS — I350 Nonrheumatic aortic (valve) stenosis: Secondary | ICD-10-CM | POA: Insufficient documentation

## 2017-03-05 DIAGNOSIS — I110 Hypertensive heart disease with heart failure: Secondary | ICD-10-CM | POA: Diagnosis not present

## 2017-03-07 NOTE — Progress Notes (Signed)
DANITZA, SCHOENFELDT ([id number]) Visit Report for 03/05/2017 Debridement Details Patient Name: [name], [name] Date of Service: 03/05/2017 10:15 AM Medical Record Patient Account Number: [id number] Number: Treating RN: Baruch Gouty, RN, BSN, Velva Harman July 22, 1941 [phone] y.o. Other Clinician: Date of Birth/Sex: Female) Treating Davell Beckstead, [place] Primary Care Provider: Beverlyn Roux Provider/Extender: G Referring Provider: Dorthula Nettles in Treatment: 17 Debridement Performed for Wound #7 Right,Dorsal Metatarsal head first Assessment: Performed By: Physician Ricard Dillon, MD Debridement: Debridement Severity of Tissue Pre Fat layer exposed Debridement: Pre-procedure Verification/Time Out Yes - 10:54 Taken: Start Time: 10:54 Pain Control: Lidocaine 4% Topical Solution Level: Skin/Subcutaneous Tissue Total Area Debrided (L x 2.5 (cm) x 1 (cm) = 2.5 (cm) W): Tissue and other Non-Viable, Fat, Fibrin/Slough, Subcutaneous material debrided: Instrument: Curette Bleeding: Minimum Hemostasis Achieved: Pressure End Time: 10:55 Procedural Pain: 0 Post Procedural Pain: 0 Response to Treatment: Procedure was tolerated well Post Debridement Measurements of Total Wound Length: (cm) 2.5 Width: (cm) 1 Depth: (cm) 1 Volume: (cm) 1.963 Character of Wound/Ulcer Post Stable Debridement: Severity of Tissue Post Debridement: Fat layer exposed Post Procedure Diagnosis Same as Pre-procedure [name], [name] ([id number]) Electronic Signature(s) Signed: 03/05/2017 1:23:55 PM By: Regan Lemming BSN, RN Signed: 03/06/2017 3:50:17 PM By: Linton Ham MD Entered By: Linton Ham on 03/05/2017 11:02:37 Morren, [name] ([id number]) -------------------------------------------------------------------------------- HPI Details Patient Name: [name], [name] Date of Service: 03/05/2017 10:15 AM Medical Record Patient Account Number: [id number] Number: Treating RN: Baruch Gouty, RN, BSN, Velva Harman 07-05-1941  [phone] y.o. Other Clinician: Date of Birth/Sex: Female) Treating Kayleeann Huxford Primary Care Provider: Beverlyn Roux Provider/Extender: G Referring Provider: Dorthula Nettles in Treatment: 17 History of Present Illness HPI Description: 08/13/16: This is a 76 year old woman who came predominantly for review of 3 cm in diameter circular wound to the left anterior lateral leg. She was in the ER on 08/01/16 I reviewed their notes. There was apparently pus coming out of the wound at that time and the patient arrived requesting debridement which they don't do in the emergency room. Nevertheless I can't see that they did any x-rays. There were no cultures done. She is a type II diabetic and I a note after the patient was in the clinic that she had a bypass graft from the popliteal to the tibial on the right on 02/28/16. She also had a right greater saphenous vein harvest on the same date for arterial bypass. She is going to have vascular studies including ABIs T ABIs on the right on 08/28/16. The patient's surgery was on 02/28/16 by Dr. Vallarie Mare she had a right below the knee popliteal artery to peroneal artery bypass with reverse greater saphenous vein and an endarterectomy of the mid segment peroneal artery. Postoperatively she had a strong mild monophasic peroneal signal with a pink foot. It would appear that the patient is had some nonhealing in the surgical saphenous vein harvest site on the left leg. Surprisingly looking [facility] I cannot see much information about this at all. Dr. Lucious Groves notes from 05/29/16 show that the patient's wounds "are not healed" the right first metatarsal wound healed but then opened back up. 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 At that point the patient had a wound VAC to her right calf with regards to the wounds on her dorsal right toe would appear  that these are felt to be arterial wounds. She has had surgery on the  metatarsal phalangeal in 2015 I Dr. Doran Durand secondary to a right metatarsal phalangeal joint fracture. She is apparently had discoloration around this area since then. 08/28/16; patient arrives with her wounds in much the same condition. The linear vein harvest site and the circular wound below it which I think was a blister. She also has to probing holes in her right great toe and a necrotic eschar on the right second toe. Because of these being arterial wounds I reduced her compression from 3-2 layers this seems to of done satisfactorily she has not had any problems. I cannot see that she is actually had an x-ray ====== 11/06/16 the patient comes in for evaluation of her right lower extremity ulcers. She was here in January 2018 for 2 visits subsequently ended up in the hospital with pneumonia and then to rehabilitation. She has now been discharged from rehabilitation and is home. She has multiple ulcerations to her right lower extremity including the foot and toes. She does have home health in place and they have been placing alginate to the ulcers. She is followed by Dr. Bridgett Larsson of vascular medicine. 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 She recently saw him on 3/23. In office ABIs were: [name] ([id number]) Right 0.48 with monophasic flow to the DP, PT, peroneal Left 0.63 with monophasic flow to the DP and PT Her arterial studies indicated a patent right below knee popliteal to peroneal bypass She had an MRI in February 2008 that was negative frosty myelitis but this showed general soft tissue edema in the right foot and lower extremity concerning for cellulitis She is a diabetic, managed with insulin. Her hemoglobin A1c in December 2017 was 8.4 which is a trend up from previous levels. She had blood work in February 2018 which revealed an  albumin of 2.6 this appears to be relatively acute as an albumin in November 2017 was 3.7 11/13/16; this is a patient I have not seen since February who is readmitted to our clinic last week. 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 Bridgett Larsson. I have reviewed Dr. Lianne Moris notes from March/23/18. Doppler ABI on that date showed an ABI on the right of 0.48 and on the left of 0.63. Dorsalis pedis waveforms were monophasic bilaterally. There was no waveforms detected at the posterior tibial on the right, monophasic on the left. Dr. Lianne Moris comments were that this patient would have follow-up vascular studies in 3 months including ABIs and right lower extremity arterial duplex. She had an MRI in February that was negative for osteomyelitis but showed generalized edema in the foot. Last albumin I see was in January at 3.4 we have been using Santyl to the 4 wounds on the right leg the patient is noted today to have widespread edema well up towards her groin this is pitting 2-3+. 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 Normal ejection fraction. 11/20/16; patient has a follow-up appointment with Dr. Bridgett Larsson on April 23. She is still complaining of a lot of pain in the right foot and right leg. It is not clear to me that this is at all positional however I think it is clear claudication with minimal activity perhaps at rest. At our suggestion she is return to her primary physician's office tomorrow with regards to her pitting lower  extremity bilateral edema that I reviewed in detail last week 11/27/16; the follow-up with Dr. Bridgett Larsson was actually on May 23 on April 23 as I stated in my note last week. N/A case all of her wounds seems somewhat smaller. The 2 on the right leg are definitely smaller. The areas on the dorsal right first toe, right third toe and the lateral part of the right fifth  metatarsal head all looks smaller but have tightly adherent surfaces. We have been using Santyl 12/04/16; follow-up with Dr. Bridgett Larsson on May 23. 2 small open wounds on the right leg continue to get smaller. The area on the right third total lateral aspect of the right fifth toe also look better. The remaining area on the dorsal first toe still has some depth to it. We have been using Santyl to the toes and collagen on the right 12/11/16; according to patient's husband the follow-up with Dr. Bridgett Larsson is not until July. All of the wounds on the right leg are measuring smaller. We have been using some combination of Prisma and Santyl although I think we can go to straight Prisma today. There may have been some confusion with home health about the primary dressing orders here. 12/25/16; the patient has had some healing this week. The area on her right lateral fifth metatarsal head, right third toe are both healed lower right leg is healed. In the vein harvest site superiorly she has one superficial open area. On the dorsal aspect of her right great toe/MTP joint the wound is now divided into 2 however the proximal area is deep and there is palpable bone 01/01/17; still an open area in the middle of her original right surgical scar. The area on the right third toe and right lateral fifth metatarsal head remained closed. Problematic area on the dorsal aspect of the toe. Previous surgery in this area line 01/08/17 small open area on the original scar on her upper anterior leg although this is closing. X-ray I did of the right first toe did not show underlying bony abnormality. Still this area on the dorsal first toe probes to bone. We have been using Prisma 01/15/17; small open area in the original scar in the upper anterior leg is almost fully closed. She has 2 open areas over the dorsal aspect of the right first toe that probes to bone. Used and a form starting last week. Vigorous bone scraping that I did last week  showed few methicillin sensitive staph aureus. She is allergic to Wojciak, Linzi V. ([id number]) penicillin and sulfa drugs. I'm going to give her 2 weeks of doxycycline. She will need an MRI with contrast. 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 She continues on doxycycline for a bone scraping that showed methicillin sensitive staph aureus [allergic to penicillin and sulfa]. We have been using Endo form to the wound. 01/29/17; surprisingly her MRI did not show osteomyelitis. She continues on doxycycline for a bone scraping that showed methicillin sensitive staph aureus with allergies to penicillin and sulfa. We have been using Endo form to the wound. Unfortunately I cannot get a surface on this visit looks like it is able to support a healing state. Proximally there is still exposed bone. There is no overt soft tissue tenderness. Her MRI did show a previous fracture was surgery to this area but no hardware 02/12/17 on evaluation today patient appears to be doing well  in regard to her lower extremity wound. She does have some mild discomfort but this is minimal. There's no evidence of infection. Her left great toe nail has somewhat been lifted up and there is a little bit of slight bleeding underneath but this is still firmly attached. 02/19/17; she ran out of doxycycline 2 days ago. Nevertheless I would like to continue this for 2 weeks to make a full 6 weeks of therapy as the bone scraping that I did from the open area on the dorsal right first toe showed MRSA. This should complete antibiotic therapy. 02/26/17; the patient is completing her 6 weeks of oral doxycycline. Deep wound on the dorsal right toe. This still has some exposed bone proximally. She does have a reasonably granulated surface albeit thin over bone. I've been using Endo form have made application for an amniotic skin sub 03/05/17; the patient has completed  6 weeks of doxycycline. This is a deep wound on the dorsal right toe which has exposed bone proximally. She has granulation over most of this wound albeit a thin layer. I've been using Endo form. Still do not have approval for [employer] Electronic Signature(s) Signed: 03/06/2017 3:50:17 PM By: Linton Ham MD Entered By: Linton Ham on 03/05/2017 11:04:00 Camero, Myrle [name] ([id number]) -------------------------------------------------------------------------------- Physical Exam Details Patient Name: Weisenburger, Lianni V. Date of Service: 03/05/2017 10:15 AM Medical Record Patient Account Number: [id number] Number: Treating RN: Baruch Gouty, RN, BSN, Velva Harman 04/12/1941 [phone] y.o. Other Clinician: Date of Birth/Sex: Female) Treating Louvenia Golomb Primary Care Provider: Beverlyn Roux Provider/Extender: G Referring Provider: Dorthula Nettles in Treatment: 51 Constitutional Patient is hypertensive.. Pulse regular and within target range for patient.[name] Respirations regular, non-labored and within target range.. Temperature is normal and within the target range for the patient.[name] appears in no distress. Notes Wound exam; right great toe dorsally. Proximally in the wound there is still exposed bone. Slight necrotic surface lightly to provided with the #3 curet. Granulation at the base is present albeit a very thin layer. There is no overt surrounding infection. I can't feel her dorsalis pedis pulse and her toe is warm. She is status post revascularization by Dr. Bridgett Larsson Electronic Signature(s) Signed: 03/06/2017 3:50:17 PM By: Linton Ham MD Entered By: Linton Ham on 03/05/2017 11:05:22 Marsico, [name] ([id number]) -------------------------------------------------------------------------------- Physician Orders Details Patient Name: Pandolfi, Eleisha V. Date of Service: 03/05/2017 10:15 AM Medical Record Patient Account Number: [id number] Number: Treating RN: Cornell Barman Mar 04, 1941 (76  y.o. Other Clinician: Date of Birth/Sex: Female) Treating Ramsie Ostrander Primary Care Provider: Beverlyn Roux Provider/Extender: G Referring Provider: Dorthula Nettles in Treatment: 21 Verbal / Phone Orders: No Diagnosis Coding Wound Cleansing Wound #7 Right,Dorsal Metatarsal head first o Clean wound with Normal Saline. o Cleanse wound with mild soap and water Anesthetic Wound #7 Right,Dorsal Metatarsal head first o Topical Lidocaine 4% cream applied to wound bed prior to debridement Primary Wound Dressing Wound #7 Right,Dorsal Metatarsal head first o Other: - ENDOFORM Secondary Dressing Wound #7 Right,Dorsal Metatarsal head first o Dry Gauze o Conform/Kerlix Dressing Change Frequency Wound #7 Right,Dorsal Metatarsal head first o Change dressing every other day. Follow-up Appointments Wound #7 Right,Dorsal Metatarsal head first o Return Appointment in 1 week. Edema Control Wound #7 Right,Dorsal Metatarsal head first o Elevate legs to the level of the heart and pump ankles as often as possible Additional Orders / Instructions Wound #7 Right,Dorsal Metatarsal head first o Increase protein intake. Kagawa, JAMETTA MOOREHEAD ([id number]) o Activity as tolerated Home Health Wound #7  Right,Dorsal Metatarsal head first o [place] [facility] twice weekly, [facility]-Wednesday o Home Health Nurse may visit PRN to address patientos wound care needs. o FACE TO FACE ENCOUNTER: MEDICARE and MEDICAID PATIENTS: I certify that this patient is under my care and that I had a face-to-face encounter that meets the physician face-to-face encounter requirements with this patient on this date. The encounter with the patient was in whole or in part for the following MEDICAL CONDITION: (primary reason for [place]) MEDICAL NECESSITY: I certify, that based on my findings, NURSING services are a medically necessary home health service. HOME BOUND  STATUS: I certify that my clinical findings support that this patient is homebound (i.e., Due to illness or injury, pt requires aid of supportive devices such as crutches, cane, wheelchairs, walkers, the use of special transportation or the assistance of another person to leave their place of residence. There is a normal inability to leave the home and doing so requires considerable and taxing effort. Other absences are for medical reasons / religious services and are infrequent or of short duration when for other reasons). o If current dressing causes regression in wound condition, may D/C ordered dressing product/s and apply Normal Saline Moist Dressing daily until next [place] / Other MD appointment. Thompson of regression in wound condition at [phone]. o Please direct any NON-WOUND related issues/requests for orders to patient's Primary Care Physician Medications-please add to medication list. Wound #7 Right,Dorsal Metatarsal head first o P.O. Antibiotics - Doxy 100mg  BID Electronic Signature(s) Signed: 03/06/2017 10:47:32 AM By: Gretta Cool, BSN, RN, CWS, Kim RN, BSN Signed: 03/06/2017 3:50:17 PM By: Linton Ham MD Entered By: Gretta Cool, BSN, RN, CWS, Kim on 03/05/2017 10:56:20 Riva, [name] ([id number]) -------------------------------------------------------------------------------- Problem List Details Patient Name: Medel, Donnia V. Date of Service: 03/05/2017 10:15 AM Medical Record Patient Account Number: [id number] Number: Treating RN: Baruch Gouty, RN, BSN, Velva Harman 1941/05/04 [phone] y.o. 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 ([id number]) -------------------------------------------------------------------------------- Progress Note Details Patient Name: Brandenburger, Janesia V. Date of Service: 03/05/2017 10:15 AM Medical Record Patient Account Number: [id number] Number: Treating RN: Baruch Gouty, RN, BSN, Velva Harman 1941/03/30 [phone] y.o. Other Clinician: Date of Birth/Sex: Female) Treating Rainbow Salman Primary Care Provider: Beverlyn Roux Provider/Extender: G Referring Provider: Dorthula Nettles in Treatment: 17 Subjective History of Present Illness (HPI) 08/13/16: This is a 76 year old woman who came predominantly for review of 3 cm in diameter circular wound to the left anterior lateral leg. She was in the ER on 08/01/16 I reviewed their notes. There was apparently pus coming out of the wound at that time and  the patient arrived requesting debridement which they don't do in the emergency room. Nevertheless I can't see that they did any x-rays. There were no cultures done. She is a type II diabetic and I a note after the patient was in the clinic that she had a bypass graft from the popliteal to the tibial on the right on 02/28/16. She also had a right greater saphenous vein harvest on the same date for arterial  bypass. She is going to have vascular studies including ABIs T ABIs on the right on 08/28/16. The patient's surgery was on 02/28/16 by Dr. Vallarie Mare she had a right below the knee popliteal artery to peroneal artery bypass with reverse greater saphenous vein and an endarterectomy of the mid segment peroneal artery. Postoperatively she had a strong mild monophasic peroneal signal with a pink foot. It would appear that the patient is had some nonhealing in the surgical saphenous vein harvest site on the left leg. Surprisingly looking [facility] I cannot see much information about this at all. Dr. Lucious Groves notes from 05/29/16 show that the patient's wounds "are not healed" the right first metatarsal wound healed but then opened back up. 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 At that point the patient had a wound VAC to her right calf with regards to the wounds on her dorsal right toe would appear that these are felt to be arterial wounds. She has had surgery on the metatarsal phalangeal in 2015 I Dr. Doran Durand secondary to a right metatarsal phalangeal joint fracture. She is apparently had discoloration around this area since then. 08/28/16; patient arrives with her wounds in much the same condition. The linear vein harvest site and the circular wound below it which I think was a blister. She also has to probing holes in her right great toe and a necrotic eschar on the right second toe. Because of these being arterial wounds I reduced her compression from 3-2 layers this seems to of done satisfactorily she has not had any problems. I cannot see that she is actually had an x-ray ====== 11/06/16 the patient comes in for evaluation of her right lower extremity ulcers. She was here in January 2018 for 2 visits subsequently ended up in the hospital with pneumonia and then to rehabilitation. She has  now been discharged from rehabilitation and is home. She has multiple ulcerations to her right lower extremity including the foot and toes. She does have home health in place and they have been placing alginate to the ulcers. She is followed by Dr. Bridgett Larsson of vascular medicine. 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 She recently saw him on 3/23. In office ABIs were: Dobias, JASSICA ZAZUETA. ([id number]) Right 0.48 with monophasic flow to the DP, PT, peroneal Left 0.63 with monophasic flow to the DP and PT Her arterial studies indicated a patent right below knee popliteal to peroneal bypass She had an MRI in February 2008 that was negative frosty myelitis but this showed general soft tissue edema in the right foot and lower extremity concerning for cellulitis She is a diabetic, managed with insulin. Her hemoglobin A1c in December 2017 was 8.4 which is a trend up from previous levels. She had blood work in February 2018 which revealed an albumin of 2.6 this appears to be relatively acute as an albumin in November  2017 was 3.7 11/13/16; this is a patient I have not seen since February who is readmitted to our clinic last week. 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 Bridgett Larsson. I have reviewed Dr. Lianne Moris notes from March/23/18. Doppler ABI on that date showed an ABI on the right of 0.48 and on the left of 0.63. Dorsalis pedis waveforms were monophasic bilaterally. There was no waveforms detected at the posterior tibial on the right, monophasic on the left. Dr. Lianne Moris comments were that this patient would have follow-up vascular studies in 3 months including ABIs and right lower extremity arterial duplex. She had an MRI in February that was negative for osteomyelitis but showed generalized edema in the foot. Last albumin I see was in January at 3.4 we have been using Santyl  to the 4 wounds on the right leg the patient is noted today to have widespread edema well up towards her groin this is pitting 2-3+. 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 Normal ejection fraction. 11/20/16; patient has a follow-up appointment with Dr. Bridgett Larsson on April 23. She is still complaining of a lot of pain in the right foot and right leg. It is not clear to me that this is at all positional however I think it is clear claudication with minimal activity perhaps at rest. At our suggestion she is return to her primary physician's office tomorrow with regards to her pitting lower extremity bilateral edema that I reviewed in detail last week 11/27/16; the follow-up with Dr. Bridgett Larsson was actually on May 23 on April 23 as I stated in my note last week. N/A case all of her wounds seems somewhat smaller. The 2 on the right leg are definitely smaller. The areas on the dorsal right first toe, right third toe and the lateral part of the right fifth metatarsal head all looks smaller but have tightly adherent surfaces. We have been using Santyl 12/04/16; follow-up with Dr. Bridgett Larsson on May 23. 2 small open wounds on the right leg continue to get smaller. The area on the right third total lateral aspect of the right fifth toe also look better. The remaining area on the dorsal first toe still has some depth to it. We have been using Santyl to the toes and collagen on the right 12/11/16; according to patient's husband the follow-up with Dr. Bridgett Larsson is not until July. All of the wounds on the right leg are measuring smaller. We have been using some combination of Prisma and Santyl although I think we can go to straight Prisma today. There may have been some confusion with home health about the primary dressing orders here. 12/25/16; the patient has had some healing this week. The area on her right lateral fifth metatarsal head, right third toe are both healed  lower right leg is healed. In the vein harvest site superiorly she has one superficial open area. On the dorsal aspect of her right great toe/MTP joint the wound is now divided into 2 however the proximal area is deep and there is palpable bone 01/01/17; still an open area in the middle of her original right surgical scar. The area on the right third toe and right lateral fifth metatarsal head remained closed. Problematic area on the dorsal aspect of the toe. Previous surgery in this area line 01/08/17 small open area on the original scar on her upper anterior leg although  this is closing. X-ray I did of the right first toe did not show underlying bony abnormality. Still this area on the dorsal first toe probes to bone. We have been using Prisma 01/15/17; small open area in the original scar in the upper anterior leg is almost fully closed. She has 2 open areas over the dorsal aspect of the right first toe that probes to bone. Used and a form starting last week. Vigorous bone scraping that I did last week showed few methicillin sensitive staph aureus. She is allergic to Eskenazi, Clista V. ([id number]) penicillin and sulfa drugs. I'm going to give her 2 weeks of doxycycline. She will need an MRI with contrast. 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 She continues on doxycycline for a bone scraping that showed methicillin sensitive staph aureus [allergic to penicillin and sulfa]. We have been using Endo form to the wound. 01/29/17; surprisingly her MRI did not show osteomyelitis. She continues on doxycycline for a bone scraping that showed methicillin sensitive staph aureus with allergies to penicillin and sulfa. We have been using Endo form to the wound. Unfortunately I cannot get a surface on this visit looks like it is able to support a healing state. Proximally there is still exposed bone. There is no overt soft tissue  tenderness. Her MRI did show a previous fracture was surgery to this area but no hardware 02/12/17 on evaluation today patient appears to be doing well in regard to her lower extremity wound. She does have some mild discomfort but this is minimal. There's no evidence of infection. Her left great toe nail has somewhat been lifted up and there is a little bit of slight bleeding underneath but this is still firmly attached. 02/19/17; she ran out of doxycycline 2 days ago. Nevertheless I would like to continue this for 2 weeks to make a full 6 weeks of therapy as the bone scraping that I did from the open area on the dorsal right first toe showed MRSA. This should complete antibiotic therapy. 02/26/17; the patient is completing her 6 weeks of oral doxycycline. Deep wound on the dorsal right toe. This still has some exposed bone proximally. She does have a reasonably granulated surface albeit thin over bone. I've been using Endo form have made application for an amniotic skin sub 03/05/17; the patient has completed 6 weeks of doxycycline. This is a deep wound on the dorsal right toe which has exposed bone proximally. She has granulation over most of this wound albeit a thin layer. I've been using Endo form. Still do not have approval for Affinity Objective Constitutional Patient is hypertensive.. Pulse regular and within target range for patient.[name] Respirations regular, non-labored and within target range.. Temperature is normal and within the target range for the patient.[name] appears in no distress. Vitals Time Taken: 10:37 AM, Height: 64 in, Weight: 200 lbs, BMI: 34.3, Temperature: 97.7 F, Pulse: 74 bpm, Respiratory Rate: 16 breaths/min, Blood Pressure: 157/50 mmHg. General Notes: Wound exam; right great toe dorsally. Proximally in the wound there is still exposed bone. Slight necrotic surface lightly to provided with the #3 curet. Granulation at the base is present albeit a very thin layer. There is  no overt surrounding infection. I can't feel her dorsalis pedis pulse and her toe is warm. She is status post revascularization by Dr. Bridgett Larsson Integumentary (Hair, Skin) Shewell, Aza V. ([id number]) Wound #10 status is Healed -  Epithelialized. Original cause of wound was Gradually Appeared. The wound is located on the Left Toe Great. The wound measures 0cm length x 0cm width x 0cm depth; 0cm^2 area and 0cm^3 volume. There is no tunneling or undermining noted. There is a none present amount of drainage noted. The wound margin is flat and intact. There is large (67-100%) friable granulation within the wound bed. There is no necrotic tissue within the wound bed. The periwound skin appearance did not exhibit: Callus, Crepitus, Excoriation, Induration, Rash, Scarring, Dry/Scaly, Maceration, Atrophie Blanche, Cyanosis, Ecchymosis, Hemosiderin Staining, Mottled, Pallor, Rubor, Erythema. Periwound temperature was noted as No Abnormality. Wound #7 status is Open. Original cause of wound was Gradually Appeared. The wound is located on the Right,Dorsal Metatarsal head first. The wound measures 2.5cm length x 1cm width x 1cm depth; 1.963cm^2 area and 1.963cm^3 volume. There is bone, muscle, and Fat Layer (Subcutaneous Tissue) Exposed exposed. There is no tunneling or undermining noted. There is a large amount of serous drainage noted. The wound margin is flat and intact. There is small (1-33%) pink, friable granulation within the wound bed. There is a medium (34-66%) amount of necrotic tissue within the wound bed including Adherent Slough. The periwound skin appearance exhibited: Induration, Scarring, Ecchymosis, Hemosiderin Staining, Mottled. The periwound skin appearance did not exhibit: Maceration. Periwound temperature was noted as No Abnormality. The periwound has tenderness on palpation. 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 There was a Skin/Subcutaneous Tissue Debridement ([id number]) debridement with total area of 2.5 sq cm performed by Ricard Dillon, MD. with the following instrument(s): Curette to remove Non-Viable tissue/material including Fat Layer (and Subcutaneous Tissue) Exposed, Fibrin/Slough, and Subcutaneous after achieving pain control using Lidocaine 4% Topical Solution. A time out was conducted at 10:54, prior to the start of the procedure. A Minimum amount of bleeding was controlled with Pressure. The procedure Krogstad, Seraphina V. ([id number]) was tolerated well with a pain level of 0 throughout and a pain level of 0 following the procedure. Post Debridement Measurements: 2.5cm length x 1cm width x 1cm depth; 1.963cm^3 volume. Character of Wound/Ulcer Post Debridement is stable. Severity of Tissue Post Debridement is: Fat layer exposed. Post procedure Diagnosis Wound #7: Same as Pre-Procedure Plan Wound Cleansing: Wound #7 Right,Dorsal Metatarsal head first: Clean wound with Normal Saline. Cleanse wound with mild soap and water Anesthetic: Wound #7 Right,Dorsal Metatarsal head first: Topical Lidocaine 4% cream applied to wound bed prior to debridement Primary Wound Dressing: Wound #7 Right,Dorsal Metatarsal head first: Other:  - ENDOFORM Secondary Dressing: Wound #7 Right,Dorsal Metatarsal head first: Dry Gauze Conform/Kerlix Dressing Change Frequency: Wound #7 Right,Dorsal Metatarsal head first: Change dressing every other day. Follow-up Appointments: Wound #7 Right,Dorsal Metatarsal head first: Return Appointment in 1 week. Edema Control: Wound #7 Right,Dorsal Metatarsal head first: Elevate legs to the level of the heart  and pump ankles as often as possible Additional Orders / Instructions: Wound #7 Right,Dorsal Metatarsal head first: Increase protein intake. Activity as tolerated Home Health: Wound #7 Right,Dorsal Metatarsal head first: [place] [facility] weekly, [facility] Nurse may visit [facility] to address patient s wound care needs. FACE TO FACE ENCOUNTER: MEDICARE and MEDICAID PATIENTS: I certify that this patient is under my care and that I had a face-to-face encounter that meets the physician face-to-face encounter requirements with this patient on this date. The encounter with the patient was in whole or in part for the following MEDICAL CONDITION: (primary reason for [place]) MEDICAL NECESSITY: I certify, that based on my findings, NURSING services are a medically necessary home health service. HOME BOUND STATUS: I certify that my clinical findings support that this patient is homebound (i.e., Due to Gallen, AIYANNA AWTREY. ([id number]) illness or injury, pt requires aid of supportive devices such as crutches, cane, wheelchairs, walkers, the use of special transportation or the assistance of another person to leave their place of residence. There is a normal inability to leave the home and doing so requires considerable and taxing effort. Other absences are for medical reasons / religious services and are infrequent or of short duration when for other reasons). If current dressing causes regression in wound condition, may D/C ordered dressing  product/s and apply Normal Saline Moist Dressing daily until next [place] / Other MD appointment. [place] of regression in wound condition at [phone]. Please direct any NON-WOUND related issues/requests for orders to patient's Primary Care Physician Medications-please add to medication list.: Wound #7 Right,Dorsal Metatarsal head first: P.O. Antibiotics - Doxy 100mg  BID o #1 continue with endo-form #2 I would like to proceed with Affinity when available #3 the patient has a follow-up with Dr. Bridgett Larsson will need to see when that appointment is. Based on clinical exam it would seem that she has enough blood flow to heal this wound. Electronic Signature(s) Signed: 03/06/2017 3:50:17 PM By: Linton Ham MD Entered By: Linton Ham on 03/05/2017 11:10:37 Fitchett, Pari [name] ([id number]) -------------------------------------------------------------------------------- SuperBill Details Patient Name: Michelle, Dariela V. Date of Service: 03/05/2017 Medical Record Patient Account Number: [id number] Number: Treating RN: Baruch Gouty, RN, BSN, Velva Harman Mar 22, 1941 [phone] y.o. 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 diabetic peripheral angiopathy with gangrene I70.232 Atherosclerosis 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 of calf I70.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 of foot Facility Procedures CPT4: Description Modifier  Quantity Code [id number] 11042 - DEB SUBQ TISSUE 20 SQ CM/< 1 ICD-10 Description Diagnosis I70.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 of foot Physician Procedures CPT4: Description Modifier Quantity Code [id number] [id number] - WC PHYS SUBQ TISS 20 SQ CM 1 ICD-10 Description Diagnosis I70.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 of foot Electronic Signature(s) Signed: 03/06/2017 3:50:17 PM By: Linton Ham MD Entered By: Linton Ham on 03/05/2017 11:11:03

## 2017-03-07 NOTE — Progress Notes (Signed)
NICHOEL, DIGIULIO ([id number]) Visit Report for 03/05/2017 Arrival Information Details Patient Name: Binegar, MERYLE PUGMIRE. Date of Service: 03/05/2017 10:15 AM Medical Record Number: [id number] Patient Account Number: [id number] Date of Birth/Sex: Jan 14, 1941 (76 y.o. Female) Treating RN: Cornell Barman Primary Care Charmel Pronovost: Beverlyn Roux Other Clinician: Referring Laryssa Hassing: Beverlyn Roux Treating Thamara Leger/Extender: Tito Dine in Treatment: 64 Visit Information History Since Last Visit Added or deleted any medications: No Patient Arrived: Wheel Chair Any new allergies or adverse reactions: No Arrival Time: 10:35 Had a fall or experienced change in No activities of daily living that may affect Accompanied By: husband risk of falls: Transfer Assistance: Manual Signs or symptoms of abuse/neglect No Patient Identification Verified: Yes since last visito Secondary Verification Process Yes Hospitalized since last visit: No Completed: Has Dressing in Place as Prescribed: Yes Patient Requires Transmission-Based No Has Footwear/Offloading in Place as Yes Precautions: Prescribed: Patient Has Alerts: No Left: Surgical Shoe with Pressure Relief Insole Right: Surgical Shoe with Pressure Relief Insole Pain Present Now: No Electronic Signature(s) Signed: 03/06/2017 10:47:32 AM By: Gretta Cool, BSN, RN, CWS, Kim RN, BSN Entered By: Gretta Cool, BSN, RN, CWS, Kim on 03/05/2017 10:36:39 Bryk, [name] ([id number]) -------------------------------------------------------------------------------- Encounter Discharge Information Details Patient Name: Ibach, Anzlee V. Date of Service: 03/05/2017 10:15 AM Medical Record Number: [id number] Patient Account Number: [id number] Date of Birth/Sex: 1941/03/19 (76 y.o. Female) Treating RN: Cornell Barman Primary Care Merian Wroe: Beverlyn Roux Other Clinician: Referring Jayln Madeira: Beverlyn Roux Treating Arizona Sorn/Extender: Tito Dine in Treatment: 17 Encounter Discharge  Information Items Discharge Pain Level: 0 Discharge Condition: Stable Ambulatory Status: Wheelchair Discharge Destination: Home Transportation: Other Accompanied By: hubby Schedule Follow-up Appointment: No Medication Reconciliation completed No and provided to Patient/Care Zaydon Kinser: Provided on Clinical Summary of Care: 03/05/2017 Form Type Recipient Paper Patient [facility] Electronic Signature(s) Signed: 03/05/2017 11:13:44 AM By: Ruthine Dose Entered By: Ruthine Dose on 03/05/2017 11:13:44 Murin, Alejandria V. ([id number]) -------------------------------------------------------------------------------- Lower Extremity Assessment Details Patient Name: Brickey, Memori V. Date of Service: 03/05/2017 10:15 AM Medical Record Number: [id number] Patient Account Number: [id number] Date of Birth/Sex: 1941/04/19 (76 y.o. Female) Treating RN: Cornell Barman Primary Care Jaris Kohles: Beverlyn Roux Other Clinician: Referring Antionetta Ator: Beverlyn Roux Treating Jameica Couts/Extender: Tito Dine in Treatment: 17 Vascular Assessment Claudication: Claudication Assessment [Left:None] [Right:None] Pulses: Dorsalis Pedis Palpable: [Left:Yes] [Right:Yes] Posterior Tibial Extremity colors, hair growth, and conditions: Extremity Color: [Left:Mottled] [Right:Mottled] Hair Growth on Extremity: [Left:Yes] [Right:Yes] Temperature of Extremity: [Left:Warm] [Right:Warm] Capillary Refill: [Left:< 3 seconds] [Right:< 3 seconds] Toe Nail Assessment Left: Right: Thick: Yes Yes Discolored: Yes Yes Deformed: Yes Yes Improper Length and Hygiene: Yes Yes Electronic Signature(s) Signed: 03/06/2017 10:47:32 AM By: Gretta Cool, BSN, RN, CWS, Kim RN, BSN Entered By: Gretta Cool, BSN, RN, CWS, Kim on 03/05/2017 10:51:24 Nunziata, Malayna [name] ([id number]) -------------------------------------------------------------------------------- Multi Wound Chart Details Patient Name: Moncur, Arayah V. Date of Service: 03/05/2017 10:15 AM Medical Record  Number: [id number] Patient Account Number: [id number] Date of Birth/Sex: 05-17-1941 (76 y.o. 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] [7:the Lower Extremity] Comorbid History: [10:Cataracts, Chronic sinus Cataracts, Chronic sinus  N/A problems/congestion, Congestive Heart Failure, Congestive Heart Failure, Hypertension, Type II Diabetes] [7:problems/congestion, Hypertension, Type II Diabetes] Date Acquired: [10:02/04/2017] [7:08/06/2013] [N/A:N/A] Weeks of Treatment: [10:3] [7:17] [N/A:N/A] Wound Status: [10:Healed - Epithelialized] [7:Open] [N/A:N/A] Measurements L x W x D 0x0x0 [7:2.5x1x1] [N/A:N/A] (cm) Area (cm) : [10:0] [7:1.963] [N/A:N/A] Volume (cm) : [10:0] [7:1.963] [N/A:N/A] % Reduction in Area: [10:100.00%] [7:-66.60%] [N/A:N/A] % Reduction in Volume: 100.00% [7:-456.10%] [N/A:N/A] Classification: [10:Grade 1] [7:Grade 2] [N/A:N/A] Exudate Amount: [10:None Present] [7:Large] [N/A:N/A] Exudate Type: [10:N/A] [7:Serous] [N/A:N/A] Exudate Color: [10:N/A] [7:amber] [N/A:N/A] Wound Margin: [10:Flat and Intact] [7:Flat and Intact] [N/A:N/A] Granulation Amount: [10:Large (67-100%)] [7:Small (1-33%)] [N/A:N/A] Granulation Quality: [10:Friable] [7:Pink, Friable] [N/A:N/A] Necrotic Amount: [10:None Present (0%)] [7:Medium (34-66%)] [N/A:N/A] Exposed Structures: [10:Fat Layer  (Subcutaneous Fat Layer (Subcutaneous N/A Tissue) Exposed: No] [7:Tissue) Exposed: Yes] Muscle: Yes Bone: Yes Fascia: No Tendon: No Joint: No Epithelialization: Large (67-100%) Small (1-33%) N/A Periwound Skin Texture: Excoriation: No Induration: Yes N/A Induration: No Scarring: Yes Callus: No Crepitus: No Rash: No Scarring: No Periwound Skin Maceration: No Maceration: No N/A Moisture: Dry/Scaly: No Periwound Skin Color: Atrophie Blanche: No Ecchymosis: Yes N/A Cyanosis: No Hemosiderin Staining: Yes Ecchymosis: No Mottled: Yes Erythema: No Hemosiderin Staining: No Mottled: No Pallor: No Rubor: No Temperature: No Abnormality No Abnormality N/A Tenderness on No Yes N/A Palpation: Wound Preparation: Ulcer Cleansing: Ulcer Cleansing: N/A Rinsed/Irrigated with Rinsed/Irrigated with Saline Saline, Other: soap and water Topical Anesthetic Applied: None Topical Anesthetic Applied: Other: lidociane 4% Treatment Notes Electronic Signature(s) Signed: 03/06/2017 10:47:32 AM By: Gretta Cool, BSN, RN, CWS, Kim RN, BSN Entered By: Gretta Cool, BSN, RN, CWS, Kim on 03/05/2017 10:52:39 Theroux, [name] ([id number]) -------------------------------------------------------------------------------- Multi-Disciplinary Care Plan Details Patient Name: Belson, Jaren V. Date of Service: 03/05/2017 10:15 AM Medical Record Number: [id number] Patient Account Number: [id number] Date of Birth/Sex: 07-May-1941 (76 y.o. Female) Treating RN: Cornell Barman Primary Care Ophie Burrowes: Beverlyn Roux Other Clinician: Referring Latarshia Jersey: Beverlyn Roux Treating Makylah Bossard/Extender: Tito Dine in Treatment: 17 Active Inactive ` Abuse / Safety / Falls / Self Care Management Nursing Diagnoses: Impaired physical mobility Potential for falls Goals: Patient will remain injury free Date Initiated: 11/06/2016 Target Resolution Date: 12/30/2016 Goal Status: Active Patient/caregiver will verbalize understanding of skin  care regimen Date Initiated: 11/06/2016 Target Resolution Date: 12/30/2016 Goal Status: Active Interventions: Assess fall risk on admission and as needed Treatment Activities: Patient referred to home care : 11/06/2016 Notes: ` Nutrition Nursing Diagnoses: Potential for alteratiion in Nutrition/Potential for imbalanced nutrition Goals: Patient/caregiver verbalizes understanding of need to maintain therapeutic glucose control per primary care physician Date Initiated: 11/06/2016 Target Resolution Date: 12/30/2016 Goal Status: Active Interventions: Provide education on elevated blood sugars and impact on wound healing Reining, Cami V. ([id number]) Notes: ` Orientation to the Wound Care Program Nursing Diagnoses: Knowledge deficit related to the wound healing center program Goals: Patient/caregiver will verbalize understanding of the [place] Date Initiated: 11/06/2016 Target Resolution Date: 12/30/2016 Goal Status: Active Interventions: Provide education on orientation to the wound center Notes: ` Venous Leg Ulcer Nursing Diagnoses: Actual venous Insuffiency (use after diagnosis is confirmed) Knowledge deficit related to disease process and management Goals: Non-invasive venous studies are completed as ordered Date Initiated: 11/06/2016 Target Resolution Date: 12/30/2016 Goal Status: Active Patient/caregiver will verbalize understanding of disease process and disease management Date Initiated: 11/06/2016 Target Resolution Date: 12/30/2016 Goal Status: Active Interventions: Assess peripheral edema status every visit. Notes: ` Wound/Skin Impairment Nursing Diagnoses: Impaired tissue integrity Knowledge deficit related to smoking impact on wound healing  Knowledge deficit related to ulceration/compromised skin integrity Goals: Barthold, Alizia V. ([id number]) Ulcer/skin breakdown will heal within 14 weeks Date Initiated: 11/06/2016 Target Resolution Date:  02/05/2017 Goal Status: Active Interventions: Assess ulceration(s) every visit Treatment Activities: Skin care regimen initiated : 11/06/2016 Notes: Electronic Signature(s) Signed: 03/06/2017 10:47:32 AM By: Gretta Cool, BSN, RN, CWS, Kim RN, BSN Entered By: Gretta Cool, BSN, RN, CWS, Kim on 03/05/2017 10:51:32 Tomerlin, Princella [name] ([id number]) -------------------------------------------------------------------------------- Pain Assessment Details Patient Name: Byrnes, Ryelynn V. Date of Service: 03/05/2017 10:15 AM Medical Record Number: [id number] Patient Account Number: [id number] Date of Birth/Sex: [birth year] (76 y.o. Female) Treating RN: Cornell Barman Primary Care Melesa Lecy: Beverlyn Roux Other Clinician: Referring Francia Verry: Beverlyn Roux Treating Ree Alcalde/Extender: Tito Dine in Treatment: 17 Active Problems Location of Pain Severity and Description of Pain Patient Has Paino No Site Locations With Dressing Change: No Pain Management and Medication Current Pain Management: Goals for Pain Management Topical or injectable lidocaine is offered to patient for acute pain when surgical debridement is performed. If needed, Patient is instructed to use over the counter pain medication for the following 24-48 hours after debridement. Wound care MDs do not prescribed pain medications. Patient has chronic pain or uncontrolled pain. Patient has been instructed to make an appointment with their Primary Care Physician for pain management. Electronic Signature(s) Signed: 03/06/2017 10:47:32 AM By: Gretta Cool, BSN, RN, CWS, Kim RN, BSN Entered By: Gretta Cool, BSN, RN, CWS, Kim on 03/05/2017 10:37:00 Calabrese, [name] ([id number]) -------------------------------------------------------------------------------- Patient/Caregiver Education Details Patient Name: Overstreet, Kaysie V. Date of Service: 03/05/2017 10:15 AM Medical Record Patient Account Number: [id number] Number: Treating RN: Cornell Barman [birth year] (76 y.o.  Other Clinician: Date of Birth/Gender: Female) Treating ROBSON, [name] Primary Care Physician: Beverlyn Roux Physician/Extender: G Referring Physician: Dorthula Nettles in Treatment: 2 Education Assessment Education Provided To: Patient Education Topics Provided Elevated Blood Sugar/ Impact on Healing: Methods: Explain/Verbal Responses: Reinforcements needed Welcome To [facility]: Methods: Explain/Verbal Responses: Reinforcements needed Electronic Signature(s) Signed: 03/06/2017 10:47:32 AM By: Gretta Cool, BSN, RN, CWS, Kim RN, BSN Entered By: Gretta Cool, BSN, RN, CWS, Kim on 03/05/2017 11:11:33 Gorey, Aitana [name] ([id number]) -------------------------------------------------------------------------------- Wound Assessment Details Patient Name: Schlottman, Brittania V. Date of Service: 03/05/2017 10:15 AM Medical Record Number: [id number] Patient Account Number: [id number] Date of Birth/Sex: 09-20-1940 (76 y.o. Female) Treating RN: Cornell Barman Primary Care Keilany Burnette: Beverlyn Roux Other Clinician: Referring Lively Haberman: Beverlyn Roux Treating Celinda Dethlefs/Extender: Tito Dine in Treatment: 17 Wound Status Wound Number: 10 Primary Diabetic Wound/Ulcer of the Lower Etiology: Extremity Wound Location: Left Toe Great Wound Healed - Epithelialized Wounding Event: Gradually Appeared Status: Date Acquired: 02/04/2017 Comorbid Cataracts, Chronic sinus Weeks Of Treatment: 3 History: problems/congestion, Congestive Heart Clustered Wound: No Failure, Hypertension, Type II Diabetes Photos Photo Uploaded By: Gretta Cool, BSN, RN, CWS, Kim on 03/05/2017 11:51:46 Wound Measurements Length: (cm) 0 % Reduction in Width: (cm) 0 % Reduction in Depth: (cm) 0 Epithelializat Area: (cm) 0 Tunneling: Volume: (cm) 0 Undermining: Area: 100% Volume: 100% ion: Large (67-100%) No No Wound Description Classification: Grade 1 Foul Odor Afte Wound Margin: Flat and Intact Slough/Fibrino Exudate Amount:  None Present r Cleansing: No No Wound Bed Granulation Amount: Large (67-100%) Exposed Structure Granulation Quality: Friable Fat Layer (Subcutaneous Tissue) Exposed: No Necrotic Amount: None Present (0%) Periwound Skin Texture Texture Color No Abnormalities Noted: No No Abnormalities Noted: No Callus: No Atrophie Blanche: No Naugle, Kiearra V. ([id number]) Crepitus: No Cyanosis: No Excoriation: No Ecchymosis: No Induration: No Erythema: No Rash: No Hemosiderin Staining: No  Scarring: No Mottled: No Pallor: No Moisture Rubor: No No Abnormalities Noted: No Dry / Scaly: No Temperature / Pain Maceration: No Temperature: No Abnormality Wound Preparation Ulcer Cleansing: Rinsed/Irrigated with Saline Topical Anesthetic Applied: None Electronic Signature(s) Signed: 03/06/2017 10:47:32 AM By: Gretta Cool, BSN, RN, CWS, Kim RN, BSN Entered By: Gretta Cool, BSN, RN, CWS, Kim on 03/05/2017 10:45:49 Klimas, Keyasia [name] ([id number]) -------------------------------------------------------------------------------- Wound Assessment Details Patient Name: Wickliff, Dorothea V. Date of Service: 03/05/2017 10:15 AM Medical Record Number: [id number] Patient Account Number: [id number] Date of Birth/Sex: 1940/10/20 (76 y.o. Female) Treating RN: Cornell Barman Primary Care Rula Keniston: Beverlyn Roux Other Clinician: Referring Darnita Woodrum: Beverlyn Roux Treating Keri Tavella/Extender: Tito Dine in Treatment: 17 Wound Status Wound Number: 7 Primary Diabetic Wound/Ulcer of the Lower Etiology: Extremity Wound Location: Right Metatarsal head first - Dorsal Wound Open Status: Wounding Event: Gradually Appeared Comorbid Cataracts, Chronic sinus Date Acquired: 08/06/2013 History: problems/congestion, Congestive Heart Weeks Of Treatment: 17 Failure, Hypertension, Type II Diabetes Clustered Wound: No Photos Photo Uploaded By: Gretta Cool, BSN, RN, CWS, Kim on 03/05/2017 11:51:46 Wound Measurements Length: (cm) 2.5 Width: (cm)  1 Depth: (cm) 1 Area: (cm) 1.963 Volume: (cm) 1.963 % Reduction in Area: -66.6% % Reduction in Volume: -456.1% Epithelialization: Small (1-33%) Tunneling: No Undermining: No Wound Description Classification: Grade 2 Wound Margin: Flat and Intact Exudate Amount: Large Exudate Type: Serous Exudate Color: amber Foul Odor After Cleansing: No Slough/Fibrino Yes Wound Bed Granulation Amount: Small (1-33%) Exposed Structure Granulation Quality: Pink, Friable Fascia Exposed: No Necrotic Amount: Medium (34-66%) Fat Layer (Subcutaneous Tissue) Exposed: Yes Necrotic Quality: Adherent Slough Tendon Exposed: No Muscle Exposed: Yes Necrosis of Muscle: No Doria, Florinda V. ([id number]) Joint Exposed: No Bone Exposed: Yes Periwound Skin Texture Texture Color No Abnormalities Noted: No No Abnormalities Noted: No Induration: Yes Ecchymosis: Yes Scarring: Yes Hemosiderin Staining: Yes Mottled: Yes Moisture No Abnormalities Noted: No Temperature / Pain Maceration: No Temperature: No Abnormality Tenderness on Palpation: Yes Wound Preparation Ulcer Cleansing: Rinsed/Irrigated with Saline, Other: soap and water, Topical Anesthetic Applied: Other: lidociane 4%, Treatment Notes Wound #7 (Right, Dorsal Metatarsal head first) 1. Cleansed with: Cleanse wound with antibacterial soap and water 4. Dressing Applied: Other dressing (specify in notes) 5. Secondary Dressing Applied Gauze and Kerlix/Conform 7. Secured with Tape Notes endoform Electronic Signature(s) Signed: 03/06/2017 10:47:32 AM By: Gretta Cool, BSN, RN, CWS, Kim RN, BSN Entered By: Gretta Cool, BSN, RN, CWS, Kim on 03/05/2017 10:46:09 Lex, [name] ([id number]) -------------------------------------------------------------------------------- [place] Details Patient Name: Dark, Vandora V. Date of Service: 03/05/2017 10:15 AM Medical Record Number: [id number] Patient Account Number: [id number] Date of Birth/Sex: 04/11/1941 (76 y.o.  Female) Treating RN: Cornell Barman Primary Care Keene Gilkey: Beverlyn Roux Other Clinician: Referring Ridley Dileo: Beverlyn Roux Treating Cherylyn Sundby/Extender: Tito Dine in Treatment: 17 Vital Signs Time Taken: 10:37 Temperature (F): 97.7 Height (in): 64 Pulse (bpm): 74 Weight (lbs): 200 Respiratory Rate (breaths/min): 16 Body Mass Index (BMI): 34.3 Blood Pressure (mmHg): 157/50 Reference Range: 80 - 120 mg / dl Electronic Signature(s) Signed: 03/06/2017 10:47:32 AM By: Gretta Cool, BSN, RN, CWS, Kim RN, BSN Entered By: Gretta Cool, BSN, RN, CWS, Kim on 03/05/2017 10:37:18

## 2017-03-12 NOTE — Progress Notes (Signed)
REVIEWED-NO ADDITIONAL RECOMMENDATIONS. 

## 2017-03-13 ENCOUNTER — Encounter: Payer: Medicare HMO | Admitting: Physician Assistant

## 2017-03-13 DIAGNOSIS — E11622 Type 2 diabetes mellitus with other skin ulcer: Secondary | ICD-10-CM | POA: Diagnosis not present

## 2017-03-15 NOTE — Progress Notes (Signed)
[name] ([id number]) Visit Report for 03/13/2017 Chief Complaint Document Details Patient Name: [name], [name] Date of Service: 03/13/2017 10:15 AM Medical Record Number: [id number] Patient Account Number: [id number] Date of Birth/Sex: 07-27-1941 (76 y.o. Female) Treating RN: Cornell Barman Primary Care Provider: Beverlyn Roux Other Clinician: Referring Provider: Beverlyn Roux Treating Provider/Extender: Melburn Hake, HOYT Weeks in Treatment: 18 Information Obtained from: Patient Chief Complaint The patient is here for initial evaluation of RLE and Right foot/toe ulcers Electronic Signature(s) Signed: 03/14/2017 10:18:42 AM By: Worthy Keeler PA-C Entered By: Worthy Keeler on 03/13/2017 10:42:37 [name], [name] ([id number]) -------------------------------------------------------------------------------- HPI Details Patient Name: [name], [name] Date of Service: 03/13/2017 10:15 AM Medical Record Number: [id number] Patient Account Number: [id number] Date of Birth/Sex: 09-21-1940 (76 y.o. Female) Treating RN: Cornell Barman Primary Care Provider: Beverlyn Roux Other Clinician: Referring Provider: Beverlyn Roux Treating Provider/Extender: Melburn Hake, HOYT Weeks in Treatment: 18 History of Present Illness HPI Description: 08/13/16: This is a 76 year old woman who came predominantly for review of 3 cm in diameter circular wound to the left anterior lateral leg. She was in the ER on 08/01/16 I reviewed their notes. There was apparently pus coming out of the wound at that time and the patient arrived requesting debridement which they don't do in the emergency room. Nevertheless I can't see that they did any x-rays. There were no cultures done. She is a type II diabetic and I a note after the patient was in the clinic that she had a bypass graft from the popliteal to the tibial on the right on 02/28/16. She also had a right greater saphenous vein harvest on the same date for arterial bypass. She is going to  have vascular studies including ABIs T ABIs on the right on 08/28/16. The patient's surgery was on 02/28/16 by Dr. Vallarie Mare she had a right below the knee popliteal artery to peroneal artery bypass with reverse greater saphenous vein and an endarterectomy of the mid segment peroneal artery. Postoperatively she had a strong mild monophasic peroneal signal with a pink foot. It would appear that the patient is had some nonhealing in the surgical saphenous vein harvest site on the left leg. Surprisingly looking [facility] I cannot see much information about this at all. Dr. Lucious Groves notes from 05/29/16 show that the patient's wounds "are not healed" the right first metatarsal wound healed but then opened back up. 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 At that point the patient had a wound VAC to her right calf with regards to the wounds on her dorsal right toe would appear that these are felt to be arterial wounds. She has had surgery on the metatarsal phalangeal in 2015 I Dr. Doran Durand secondary to a right metatarsal phalangeal joint fracture. She is apparently had discoloration around this area since then. 08/28/16; patient arrives with her wounds in much the same condition. The linear vein harvest site and the circular wound below it which I think was a blister. She also has to probing holes in her right great toe and a necrotic eschar on the right second toe. Because of these being arterial wounds I reduced her compression from 3-2 layers this seems to of done satisfactorily she has not had any problems. I cannot see that she is actually had an x-ray ====== 11/06/16 the patient comes in for evaluation of her right lower extremity ulcers. She was here in  January 2018 for 2 visits subsequently ended up in the hospital with pneumonia and then to rehabilitation. She has now been discharged  from rehabilitation and is home. She has multiple ulcerations to her right lower extremity including the foot and toes. She does have home health in place and they have been placing alginate to the ulcers. She is followed by Dr. Bridgett Larsson of vascular medicine. 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 She recently saw him on 3/23. In office ABIs were: Right 0.48 with monophasic flow to the DP, PT, peroneal Left 0.63 with monophasic flow to the DP and PT [name], [name] ([id number]) Her arterial studies indicated a patent right below knee popliteal to peroneal bypass She had an MRI in February 2008 that was negative frosty myelitis but this showed general soft tissue edema in the right foot and lower extremity concerning for cellulitis She is a diabetic, managed with insulin. Her hemoglobin A1c in December 2017 was 8.4 which is a trend up from previous levels. She had blood work in February 2018 which revealed an albumin of 2.6 this appears to be relatively acute as an albumin in November 2017 was 3.7 11/13/16; this is a patient I have not seen since February who is readmitted to our clinic last week. 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 Bridgett Larsson. I have reviewed Dr. Lianne Moris notes from March/23/18. Doppler ABI on that date showed an ABI on the right of 0.48 and on the left of 0.63. Dorsalis pedis waveforms were monophasic bilaterally. There was no waveforms detected at the posterior tibial on the right, monophasic on the left. Dr. Lianne Moris comments were that this patient would have follow-up vascular studies in 3 months including ABIs and right lower extremity arterial duplex. She had an MRI in February that was negative for osteomyelitis but showed generalized edema in the foot. Last albumin I see was in January at 3.4 we have been using Santyl to the 4 wounds  on the right leg the patient is noted today to have widespread edema well up towards her groin this is pitting 2-3+. 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 Normal ejection fraction. 11/20/16; patient has a follow-up appointment with Dr. Bridgett Larsson on April 23. She is still complaining of a lot of pain in the right foot and right leg. It is not clear to me that this is at all positional however I think it is clear claudication with minimal activity perhaps at rest. At our suggestion she is return to her primary physician's office tomorrow with regards to her pitting lower extremity bilateral edema that I reviewed in detail last week 11/27/16; the follow-up with Dr. Bridgett Larsson was actually on May 23 on April 23 as I stated in my note last week. N/A case all of her wounds seems somewhat smaller. The 2 on the right leg are definitely smaller. The areas on the dorsal right first toe, right third toe and the lateral part of the right fifth metatarsal head all looks smaller but have tightly adherent surfaces. We have been using Santyl 12/04/16; follow-up with Dr. Bridgett Larsson on May 23. 2 small open wounds on the right leg continue to get smaller. The area on the right third total lateral aspect of the right fifth toe also look better. The remaining area on  the dorsal first toe still has some depth to it. We have been using Santyl to the toes and collagen on the right 12/11/16; according to patient's husband the follow-up with Dr. Bridgett Larsson is not until July. All of the wounds on the right leg are measuring smaller. We have been using some combination of Prisma and Santyl although I think we can go to straight Prisma today. There may have been some confusion with home health about the primary dressing orders here. 12/25/16; the patient has had some healing this week. The area on her right lateral fifth metatarsal head, right third toe are both healed lower right leg is  healed. In the vein harvest site superiorly she has one superficial open area. On the dorsal aspect of her right great toe/MTP joint the wound is now divided into 2 however the proximal area is deep and there is palpable bone 01/01/17; still an open area in the middle of her original right surgical scar. The area on the right third toe and right lateral fifth metatarsal head remained closed. Problematic area on the dorsal aspect of the toe. Previous surgery in this area line 01/08/17 small open area on the original scar on her upper anterior leg although this is closing. X-ray I did of the right first toe did not show underlying bony abnormality. Still this area on the dorsal first toe probes to bone. We have been using Prisma 01/15/17; small open area in the original scar in the upper anterior leg is almost fully closed. She has 2 open areas over the dorsal aspect of the right first toe that probes to bone. Used and a form starting last week. Vigorous bone scraping that I did last week showed few methicillin sensitive staph aureus. She is allergic to penicillin and sulfa drugs. I'm going to give her 2 weeks of doxycycline. She will need an MRI with contrast. 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 ([id number]) 01/22/17; patient has her MRI this afternoon. She continues on doxycycline for a bone scraping that showed methicillin sensitive staph aureus [allergic to penicillin and sulfa]. We have been using Endo form to the wound. 01/29/17; surprisingly her MRI did not show osteomyelitis. She continues on doxycycline for a bone scraping that showed methicillin sensitive staph aureus with allergies to penicillin and sulfa. We have been using Endo form to the wound. Unfortunately I cannot get a surface on this visit looks like it is able to support a healing state. Proximally there is still exposed bone. There is no overt soft tissue tenderness. Her MRI  did show a previous fracture was surgery to this area but no hardware 02/12/17 on evaluation today patient appears to be doing well in regard to her lower extremity wound. She does have some mild discomfort but this is minimal. There's no evidence of infection. Her left great toe nail has somewhat been lifted up and there is a little bit of slight bleeding underneath but this is still firmly attached. 02/19/17; she ran out of doxycycline 2 days ago. Nevertheless I would like to continue this for 2 weeks to make a full 6 weeks of therapy as the bone scraping that I did from the open area on the dorsal right first toe showed MRSA. This should complete antibiotic therapy. 02/26/17; the patient is completing her 6 weeks of oral doxycycline. Deep wound on the dorsal right toe. This still has some exposed bone proximally. She  does have a reasonably granulated surface albeit thin over bone. I've been using Endo form have made application for an amniotic skin sub 03/05/17; the patient has completed 6 weeks of doxycycline. This is a deep wound on the dorsal right toe which has exposed bone proximally. She has granulation over most of this wound albeit a thin layer. I've been using Endo form. Still do not have approval for Affinity 03/13/17 on evaluation today patient's right foot wound appears to be doing better measurement wise compared to her last evaluation. She has been tolerating the dressing change without complication and does have a appointment with the dietary that has been made as far as referral is concerned there just waiting for her and transportation to contact them back for an actual date. Nonetheless patient has been having less pain at this point. No fevers, chills, nausea, or vomiting noted at this time. Electronic Signature(s) Signed: 03/14/2017 10:18:42 AM By: Worthy Keeler PA-C Entered By: Worthy Keeler on 03/13/2017 10:43:55 [name], [name]  ([id number]) -------------------------------------------------------------------------------- Physical Exam Details Patient Name: [name], [name] Date of Service: 03/13/2017 10:15 AM Medical Record Number: [id number] Patient Account Number: [id number] Date of Birth/Sex: 08-25-1940 (76 y.o. Female) Treating RN: Cornell Barman Primary Care Provider: Beverlyn Roux Other Clinician: Referring Provider: Beverlyn Roux Treating Provider/Extender: STONE III, HOYT Weeks in Treatment: 79 Constitutional Well-nourished and well-hydrated in no acute distress. Respiratory normal breathing without difficulty. clear to auscultation bilaterally. Psychiatric this patient is able to make decisions and demonstrates good insight into disease process. Alert and Oriented x 3. pleasant and cooperative. Notes At this point patient's wound appears to have a fairly good granular bed noted on evaluation at this point. Due to the fact that there was a slight granulation layer covering the wound in only minimal slough on the surface sharp debridement was not performed although I did cleanse the wound well with a Saline soaked gauze down to a good surface. Patient still has bone exposed proximally. Electronic Signature(s) Signed: 03/14/2017 10:18:42 AM By: Worthy Keeler PA-C Entered By: Worthy Keeler on 03/13/2017 10:45:23 [name], [name] ([id number]) -------------------------------------------------------------------------------- Physician Orders Details Patient Name: [name], [name] Date of Service: 03/13/2017 10:15 AM Medical Record Number: [id number] Patient Account Number: [id number] Date of Birth/Sex: 1941/02/14 (76 y.o. 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 o Cleanse wound with mild soap and water Anesthetic Wound #7 Right,Dorsal Metatarsal head first o Topical Lidocaine 4% cream applied to wound bed prior to debridement Primary Wound Dressing Wound #7 Right,Dorsal Metatarsal head first o Other: - ENDOFORM Secondary Dressing Wound #7 Right,Dorsal Metatarsal head first o Dry Gauze o Conform/Kerlix Dressing Change Frequency Wound #7 Right,Dorsal Metatarsal head first o Change dressing every other day. Follow-up Appointments Wound #7 Right,Dorsal Metatarsal head first Quiros, Rufus V. (  [id number]) o Return Appointment in 1 week. Edema Control Wound #7 Right,Dorsal Metatarsal head first o Elevate legs to the level of the heart and pump ankles as often as possible Additional Orders / Instructions Wound #7 Right,Dorsal Metatarsal head first o Increase protein intake. o Activity as tolerated Home Health Wound #7 Right,Dorsal Metatarsal head first o Continue Home Health Visits - [facility] weekly, [facility] o Home Health Nurse may visit PRN to address patientos wound care needs. o FACE TO FACE ENCOUNTER: MEDICARE and MEDICAID PATIENTS: I certify that this patient is under my care and that I had a face-to-face encounter that meets the physician face-to-face encounter requirements with this patient on this date. The  encounter with the patient was in whole or in part for the following MEDICAL CONDITION: (primary reason for [place]) MEDICAL NECESSITY: I certify, that based on my findings, NURSING services are a medically necessary home health service. HOME BOUND STATUS: I certify that my clinical findings support that this patient is homebound (i.e., Due to illness or injury, pt requires aid of supportive devices such as crutches, cane, wheelchairs, walkers, the use of special transportation or the assistance of another person to leave their place of residence. There is a normal inability to leave the home and doing so requires considerable and taxing effort. Other absences are for medical reasons / religious services and are infrequent or of short duration when for other reasons). o If current dressing causes regression in wound condition, may D/C ordered dressing product/s and apply Normal Saline Moist Dressing daily until next [place] / Other MD appointment. [place] of regression in wound condition at [phone]. o Please direct any NON-WOUND related issues/requests for orders to patient's Primary Care Physician Notes Being that her wound did respond well I'm gonna recommend that we continue with the Endoform currently. We will see her for reevaluation in one week to see were things stand at that point. If anything worsened significantly in the meantime she will contact the office for additional recommendations. Electronic Signature(s) Signed: 03/14/2017 10:18:42 AM By: Worthy Keeler PA-C Entered By: Worthy Keeler on 03/13/2017 10:47:21 [name], [name] ([id number]) -------------------------------------------------------------------------------- Problem List Details Patient Name: Muffley, Myli V. Date of Service: 03/13/2017 10:15 AM Medical Record Number: [id number] Patient Account Number: [id number] Date of Birth/Sex: 09-21-1940 (76 y.o. 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 ([id number]) [name], [name] ([id number]) -------------------------------------------------------------------------------- Progress Note Details Patient Name: Ashbaugh, Gweneth V. Date of Service: 03/13/2017 10:15 AM Medical Record Number: [id number] Patient Account Number: [id number] Date of Birth/Sex: 1940-12-15 (76 y.o. Female) Treating RN: Cornell Barman Primary Care Provider: Sherril Cong,  ELENA Other Clinician: Referring Provider: Beverlyn Roux Treating Provider/Extender: Melburn Hake, HOYT Weeks in Treatment: 18 Subjective Chief Complaint Information obtained from Patient The patient is here for initial evaluation of RLE and Right foot/toe ulcers History of Present Illness (HPI) 08/13/16: This is a 76 year old woman who came predominantly for review of 3 cm in diameter circular wound to the left anterior lateral leg. She was in the ER on 08/01/16 I reviewed their notes.  There was apparently pus coming out of the wound at that time and the patient arrived requesting debridement which they don't do in the emergency room. Nevertheless I can't see that they did any x-rays. There were no cultures done. She is a type II diabetic and I a note after the patient was in the clinic that she had a bypass graft from the popliteal to the tibial on the right on 02/28/16. She also had a right greater saphenous vein harvest on the same date for arterial bypass. She is going to have vascular studies including ABIs T ABIs on the right on 08/28/16. The patient's surgery was on 02/28/16 by Dr. Vallarie Mare she had a right below the knee popliteal artery to peroneal artery bypass with reverse greater saphenous vein and an endarterectomy of the mid segment peroneal artery. Postoperatively she had a strong mild monophasic peroneal signal with a pink foot. It would appear that the patient is had some nonhealing in the surgical saphenous vein harvest site on the left leg. Surprisingly looking [facility] I cannot see much information about this at all. Dr. Lucious Groves notes from 05/29/16 show that the patient's wounds "are not healed" the right first metatarsal wound healed but then opened back up. 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 At that point the patient had a wound VAC to her right calf with regards to the wounds on her dorsal right toe would appear that these are felt to be arterial wounds. She has had surgery on the metatarsal phalangeal in 2015 I Dr. Doran Durand secondary to a right metatarsal phalangeal joint fracture. She is apparently had discoloration around this area since then. 08/28/16; patient arrives with her wounds in much the same condition. The linear vein harvest site and the circular wound below it which I think was a blister. She also has to probing holes in her  right great toe and a necrotic eschar on the right second toe. Because of these being arterial wounds I reduced her compression from 3-2 layers this seems to of done satisfactorily she has not had any problems. I cannot see that she is actually had an x-ray ====== 11/06/16 the patient comes in for evaluation of her right lower extremity ulcers. She was here in January 2018 for 2 visits subsequently ended up in the hospital with pneumonia and then to rehabilitation. She has now been discharged from rehabilitation and is home. She has multiple ulcerations to her right lower extremity including the foot and toes. She does have home health in place and they have been placing alginate to the [name], [name] ([id number]) ulcers. She is followed by Dr. Bridgett Larsson of vascular medicine. 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 She recently saw him on 3/23. In office ABIs were: Right 0.48 with monophasic flow to the DP, PT, peroneal Left 0.63 with monophasic flow to the DP and PT  Her arterial studies indicated a patent right below knee popliteal to peroneal bypass She had an MRI in February 2008 that was negative frosty myelitis but this showed general soft tissue edema in the right foot and lower extremity concerning for cellulitis She is a diabetic, managed with insulin. Her hemoglobin A1c in December 2017 was 8.4 which is a trend up from previous levels. She had blood work in February 2018 which revealed an albumin of 2.6 this appears to be relatively acute as an albumin in November 2017 was 3.7 11/13/16; this is a patient I have not seen since February who is readmitted to our clinic last week. 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 Bridgett Larsson. I have reviewed Dr. Lianne Moris notes from March/23/18. Doppler ABI on that date showed an ABI on the right of 0.48 and on the left of  0.63. Dorsalis pedis waveforms were monophasic bilaterally. There was no waveforms detected at the posterior tibial on the right, monophasic on the left. Dr. Lianne Moris comments were that this patient would have follow-up vascular studies in 3 months including ABIs and right lower extremity arterial duplex. She had an MRI in February that was negative for osteomyelitis but showed generalized edema in the foot. Last albumin I see was in January at 3.4 we have been using Santyl to the 4 wounds on the right leg the patient is noted today to have widespread edema well up towards her groin this is pitting 2-3+. 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 Normal ejection fraction. 11/20/16; patient has a follow-up appointment with Dr. Bridgett Larsson on April 23. She is still complaining of a lot of pain in the right foot and right leg. It is not clear to me that this is at all positional however I think it is clear claudication with minimal activity perhaps at rest. At our suggestion she is return to her primary physician's office tomorrow with regards to her pitting lower extremity bilateral edema that I reviewed in detail last week 11/27/16; the follow-up with Dr. Bridgett Larsson was actually on May 23 on April 23 as I stated in my note last week. N/A case all of her wounds seems somewhat smaller. The 2 on the right leg are definitely smaller. The areas on the dorsal right first toe, right third toe and the lateral part of the right fifth metatarsal head all looks smaller but have tightly adherent surfaces. We have been using Santyl 12/04/16; follow-up with Dr. Bridgett Larsson on May 23. 2 small open wounds on the right leg continue to get smaller. The area on the right third total lateral aspect of the right fifth toe also look better. The remaining area on the dorsal first toe still has some depth to it. We have been using Santyl to the toes and collagen on the right 12/11/16;  according to patient's husband the follow-up with Dr. Bridgett Larsson is not until July. All of the wounds on the right leg are measuring smaller. We have been using some combination of Prisma and Santyl although I think we can go to straight Prisma today. There may have been some confusion with home health about the primary dressing orders here. 12/25/16; the patient has had some healing this week. The area on her right lateral fifth metatarsal head, right third toe are both healed lower right leg is healed. In the vein harvest site superiorly she has one  superficial open area. On the dorsal aspect of her right great toe/MTP joint the wound is now divided into 2 however the proximal area is deep and there is palpable bone 01/01/17; still an open area in the middle of her original right surgical scar. The area on the right third toe and right lateral fifth metatarsal head remained closed. Problematic area on the dorsal aspect of the toe. Previous surgery in this area line 01/08/17 small open area on the original scar on her upper anterior leg although this is closing. X-ray I did of the right first toe did not show underlying bony abnormality. Still this area on the dorsal first toe probes to bone. We have been using [place], [name] ([id number]) 01/15/17; small open area in the original scar in the upper anterior leg is almost fully closed. She has 2 open areas over the dorsal aspect of the right first toe that probes to bone. Used and a form starting last week. Vigorous bone scraping that I did last week showed few methicillin sensitive staph aureus. She is allergic to penicillin and sulfa drugs. I'm going to give her 2 weeks of doxycycline. She will need an MRI with contrast. 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 She continues on doxycycline for a bone scraping that showed methicillin sensitive staph aureus [allergic to  penicillin and sulfa]. We have been using Endo form to the wound. 01/29/17; surprisingly her MRI did not show osteomyelitis. She continues on doxycycline for a bone scraping that showed methicillin sensitive staph aureus with allergies to penicillin and sulfa. We have been using Endo form to the wound. Unfortunately I cannot get a surface on this visit looks like it is able to support a healing state. Proximally there is still exposed bone. There is no overt soft tissue tenderness. Her MRI did show a previous fracture was surgery to this area but no hardware 02/12/17 on evaluation today patient appears to be doing well in regard to her lower extremity wound. She does have some mild discomfort but this is minimal. There's no evidence of infection. Her left great toe nail has somewhat been lifted up and there is a little bit of slight bleeding underneath but this is still firmly attached. 02/19/17; she ran out of doxycycline 2 days ago. Nevertheless I would like to continue this for 2 weeks to make a full 6 weeks of therapy as the bone scraping that I did from the open area on the dorsal right first toe showed MRSA. This should complete antibiotic therapy. 02/26/17; the patient is completing her 6 weeks of oral doxycycline. Deep wound on the dorsal right toe. This still has some exposed bone proximally. She does have a reasonably granulated surface albeit thin over bone. I've been using Endo form have made application for an amniotic skin sub 03/05/17; the patient has completed 6 weeks of doxycycline. This is a deep wound on the dorsal right toe which has exposed bone proximally. She has granulation over most of this wound albeit a thin layer. I've been using Endo form. Still do not have approval for [facility] 03/13/17 on evaluation today patient's right foot wound appears to be doing better measurement wise compared to her last evaluation. She has been tolerating the dressing change without complication  and does have a appointment with the dietary that has been made as far as referral is concerned there just waiting  for her and transportation to contact them back for an actual date. Nonetheless patient has been having less pain at this point. No fevers, chills, nausea, or vomiting noted at this time. Objective Constitutional Well-nourished and well-hydrated in no acute distress. Vitals Time Taken: 10:15 AM, Height: 64 in, Weight: 200 lbs, BMI: 34.3, Temperature: 97.8 F, Pulse: 78 bpm, Respiratory Rate: 16 breaths/min, Blood Pressure: 124/57 mmHg. Respiratory Mangus, Jordana V. ([id number]) normal breathing without difficulty. clear to auscultation bilaterally. Psychiatric this patient is able to make decisions and demonstrates good insight into disease process. Alert and Oriented x 3. pleasant and cooperative. General Notes: At this point patient's wound appears to have a fairly good granular bed noted on evaluation at this point. Due to the fact that there was a slight granulation layer covering the wound in only minimal slough on the surface sharp debridement was not performed although I did cleanse the wound well with a Saline soaked gauze down to a good surface. Patient still has bone exposed proximally. Integumentary (Hair, Skin) Wound #7 status is Open. Original cause of wound was Gradually Appeared. The wound is located on the Right,Dorsal Metatarsal head first. The wound measures 2cm length x 0.6cm width x 0.5cm depth; 0.942cm^2 area and 0.471cm^3 volume. There is bone, muscle, and Fat Layer (Subcutaneous Tissue) Exposed exposed. There is no tunneling or undermining noted. There is a large amount of serous drainage noted. The wound margin is flat and intact. There is small (1-33%) pink, friable granulation within the wound bed. There is a medium (34-66%) amount of necrotic tissue within the wound bed including Adherent Slough. The periwound skin appearance exhibited: Induration,  Scarring, Ecchymosis, Hemosiderin Staining, Mottled. The periwound skin appearance did not exhibit: Maceration. Periwound temperature was noted as No Abnormality. The periwound has tenderness on palpation. 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 Cleanse wound with mild soap and water [name], [name] ([id number]) Anesthetic: Wound #7 Right,Dorsal Metatarsal head first: Topical Lidocaine 4% cream applied to wound bed prior to debridement Primary Wound Dressing: Wound #7 Right,Dorsal Metatarsal head first: Other: - ENDOFORM Secondary Dressing: Wound #7 Right,Dorsal Metatarsal head first: Dry Gauze Conform/Kerlix Dressing Change Frequency: Wound #7 Right,Dorsal Metatarsal head first: Change dressing every other day. Follow-up Appointments: Wound #7 Right,Dorsal Metatarsal head first: Return Appointment in 1 week. Edema Control: Wound #7 Right,Dorsal Metatarsal head first: Elevate legs to the level of the heart and pump ankles as often as possible Additional Orders / Instructions: Wound #7 Right,Dorsal Metatarsal head first: Increase protein intake. Activity as tolerated Home Health: Wound #7 Right,Dorsal Metatarsal head first: [place] [facility] weekly, [facility] Nurse may visit [facility] to address patient  s wound care needs. FACE TO FACE ENCOUNTER: MEDICARE and MEDICAID PATIENTS: I certify that this patient is under my care and that I had a face-to-face encounter that meets the physician face-to-face encounter requirements with this patient on this date.  The encounter with the patient was in whole or in part for the following MEDICAL CONDITION: (primary reason for [place]) MEDICAL NECESSITY: I certify, that based on my findings, NURSING services are a medically necessary home health service. HOME BOUND STATUS: I certify that my clinical findings support that this patient is homebound (i.e., Due to illness or injury, pt requires aid of supportive devices such as crutches, cane, wheelchairs, walkers, the use of special transportation or the assistance of another person to leave their place of residence. There is a normal inability to leave the home and doing so requires considerable and taxing effort. Other absences are for medical reasons / religious services and are infrequent or of short duration when for other reasons). If current dressing causes regression in wound condition, may D/C ordered dressing product/s and apply Normal Saline Moist Dressing daily until next [place] / Other MD appointment. Garner of regression in wound condition at [phone]. Please direct any NON-WOUND related issues/requests for orders to patient's Primary Care Physician General Notes: Being that her wound did respond well I'm gonna recommend that we continue with the Endoform currently. We will see her for reevaluation in one week to see were things stand at that point. If anything worsened significantly in the meantime she will contact the office for additional recommendations. JOLAYNE, BRANSON ([id number]) Electronic Signature(s) Signed: 03/14/2017 10:18:42 AM By: Worthy Keeler PA-C Entered By: Worthy Keeler on 03/13/2017 10:47:32 Trnka, Orean V.  ([id number]) -------------------------------------------------------------------------------- SuperBill Details Patient Name: Minix, Aston V. Date of Service: 03/13/2017 Medical Record Number: [id number] Patient Account Number: [id number] Date of Birth/Sex: 1940-12-04 (76 y.o. 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 I70.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 of foot Facility Procedures CPT4 Code: [id number] Description: 99213 - WOUND CARE VISIT-LEV 3 EST PT Modifier: Quantity: 1 Physician Procedures CPT4: Description Modifier Quantity Code [id number] 99213 - WC PHYS LEVEL 3 - EST PT 1 ICD-10 Description Diagnosis E11.622 Type 2 diabetes mellitus with other skin ulcer E11.621 Type 2 diabetes mellitus with foot ulcer L97.519 Non-pressure chronic ulcer of  other part of right foot with unspecified severity E11.52 Type 2 diabetes mellitus with diabetic peripheral angiopathy with gangrene Electronic Signature(s) Signed: 03/14/2017 10:18:42 AM By: Worthy Keeler PA-C Entered By: Worthy Keeler on 03/13/2017 10:48:04

## 2017-03-15 NOTE — Progress Notes (Signed)
SUMAYAH, BEARSE ([id number]) Visit Report for 03/13/2017 Arrival Information Details Patient Name: [name] Date of Service: 03/13/2017 10:15 AM Medical Record Number: [id number] Patient Account Number: [id number] Date of Birth/Sex: [birth year] (76 y.o. Female) Treating RN: Cornell Barman Primary Care Addisson Frate: Beverlyn Roux Other Clinician: Referring Lamara Brecht: Beverlyn Roux Treating Castiel Lauricella/Extender: Melburn Hake, HOYT Weeks in Treatment: 18 Visit Information History Since Last Visit Added or deleted any medications: Yes Patient Arrived: Wheel Chair Any new allergies or adverse reactions: No Arrival Time: 10:14 Had a fall or experienced change in No activities of daily living that may affect Accompanied By: [name] risk of falls: Transfer Assistance: None Signs or symptoms of abuse/neglect since last No Patient Identification Verified: Yes visito Secondary Verification Process Yes Hospitalized since last visit: No Completed: Pain Present Now: No Patient Requires Transmission-Based No Precautions: Patient Has Alerts: No Electronic Signature(s) Signed: 03/13/2017 11:10:26 AM By: Gretta Cool, BSN, RN, CWS, Kim RN, BSN Entered By: Gretta Cool, BSN, RN, CWS, Kim on 03/13/2017 10:14:40 [name] ([id number]) -------------------------------------------------------------------------------- Clinic Level of Care Assessment Details Patient Name: [name], [name] Date of Service: 03/13/2017 10:15 AM Medical Record Number: [id number] Patient Account Number: [id number] Date of Birth/Sex: Aug 01, 1941 (77 y.o. Female) Treating RN: Cornell Barman Primary Care Dylin Ihnen: Beverlyn Roux Other Clinician: Referring Babe Clenney: Beverlyn Roux Treating Hatem Cull/Extender: Melburn Hake, HOYT Weeks in Treatment: 18 Clinic Level of Care Assessment Items TOOL 4 Quantity Score []  - Use when only an EandM is performed on FOLLOW-UP visit 0 ASSESSMENTS - Nursing Assessment / Reassessment []  - Reassessment of Co-morbidities (includes  updates in patient status) 0 X - Reassessment of Adherence to Treatment Plan 1 5 ASSESSMENTS - Wound and Skin Assessment / Reassessment X - Simple Wound Assessment / Reassessment - one wound 1 5 []  - Complex Wound Assessment / Reassessment - multiple wounds 0 []  - Dermatologic / Skin Assessment (not related to wound area) 0 ASSESSMENTS - Focused Assessment []  - Circumferential Edema Measurements - multi extremities 0 []  - Nutritional Assessment / Counseling / Intervention 0 []  - Lower Extremity Assessment (monofilament, tuning fork, pulses) 0 []  - Peripheral Arterial Disease Assessment (using hand held doppler) 0 ASSESSMENTS - Ostomy and/or Continence Assessment and Care []  - Incontinence Assessment and Management 0 []  - Ostomy Care Assessment and Management (repouching, etc.) 0 PROCESS - Coordination of Care X - Simple Patient / Family Education for ongoing care 1 15 []  - Complex (extensive) Patient / Family Education for ongoing care 0 X - Staff obtains Programmer, systems, Records, Test Results / Process Orders 1 10 []  - Staff telephones HHA, Nursing Homes / Clarify orders / etc 0 []  - Routine Transfer to another Facility (non-emergent condition) 0 [name], [name] ([id number]) []  - Routine Hospital Admission (non-emergent condition) 0 []  - New Admissions / Biomedical engineer / Ordering NPWT, Apligraf, etc. 0 []  - Emergency Hospital Admission (emergent condition) 0 X - Simple Discharge Coordination 1 10 []  - Complex (extensive) Discharge Coordination 0 PROCESS - Special Needs []  - Pediatric / Minor Patient Management 0 []  - Isolation Patient Management 0 []  - Hearing / Language / Visual special needs 0 []  - Assessment of Community assistance (transportation, D/C planning, etc.) 0 []  - Additional assistance / Altered mentation 0 []  - Support Surface(s) Assessment (bed, cushion, seat, etc.) 0 INTERVENTIONS - Wound Cleansing / Measurement X - Simple Wound Cleansing - one wound 1 5 []  -  Complex Wound Cleansing - multiple wounds 0 X - Wound Imaging (photographs - any number of wounds) 1  5 []  - Wound Tracing (instead of photographs) 0 X - Simple Wound Measurement - one wound 1 5 []  - Complex Wound Measurement - multiple wounds 0 INTERVENTIONS - Wound Dressings []  - Small Wound Dressing one or multiple wounds 0 X - Medium Wound Dressing one or multiple wounds 1 15 []  - Large Wound Dressing one or multiple wounds 0 []  - Application of Medications - topical 0 []  - Application of Medications - injection 0 INTERVENTIONS - Miscellaneous []  - External ear exam 0 [name], [name] ([id number]) []  - Specimen Collection (cultures, biopsies, blood, body fluids, etc.) 0 []  - Specimen(s) / Culture(s) sent or taken to Lab for analysis 0 []  - Patient Transfer (multiple staff / Civil Service fast streamer / Similar devices) 0 []  - Simple Staple / Suture removal (25 or less) 0 []  - Complex Staple / Suture removal (26 or more) 0 []  - Hypo / Hyperglycemic Management (close monitor of Blood Glucose) 0 []  - Ankle / Brachial Index (ABI) - do not check if billed separately 0 X - Vital Signs 1 5 Has the patient been seen at the hospital within the last three years: Yes Total Score: 80 Level Of Care: New/Established - Level 3 Electronic Signature(s) Signed: 03/13/2017 11:10:26 AM By: Gretta Cool, BSN, RN, CWS, Kim RN, BSN Entered By: Gretta Cool, BSN, RN, CWS, Kim on 03/13/2017 10:44:26 [name] ([id number]) -------------------------------------------------------------------------------- Encounter Discharge Information Details Patient Name: [name], [name] Date of Service: 03/13/2017 10:15 AM Medical Record Number: [id number] Patient Account Number: [id number] Date of Birth/Sex: May 18, 1941 (76 y.o. Female) Treating RN: Cornell Barman Primary Care Caia Lofaro: Beverlyn Roux Other Clinician: Referring Avonelle Viveros: Beverlyn Roux Treating Dayron Odland/Extender: Melburn Hake, HOYT Weeks in Treatment: 18 Encounter Discharge Information  Items Discharge Pain Level: 0 Discharge Condition: Stable Ambulatory Status: Wheelchair Discharge Destination: Home Transportation: Private Auto Accompanied By: [name] Schedule Follow-up Appointment: Yes Medication Reconciliation completed Yes and provided to Patient/Care Donshay Lupinski: Provided on Clinical Summary of Care: 03/13/2017 Form Type Recipient Paper Patient [facility] Electronic Signature(s) Signed: 03/13/2017 11:10:26 AM By: Gretta Cool, BSN, RN, CWS, Kim RN, BSN Entered By: Gretta Cool, BSN, RN, CWS, Kim on 03/13/2017 10:45:32 Baik, [name] ([id number]) -------------------------------------------------------------------------------- Lower Extremity Assessment Details Patient Name: Newsham, Vaunda V. Date of Service: 03/13/2017 10:15 AM Medical Record Number: [id number] Patient Account Number: [id number] Date of Birth/Sex: 17-Mar-1941 (76 y.o. Female) Treating RN: Cornell Barman Primary Care Azul Brumett: Beverlyn Roux Other Clinician: Referring Jadden Yim: Beverlyn Roux Treating Lynk Marti/Extender: Melburn Hake, HOYT Weeks in Treatment: 18 Vascular Assessment Pulses: Dorsalis Pedis Palpable: [Right:Yes] Posterior Tibial Extremity colors, hair growth, and conditions: Extremity Color: [Right:Mottled] Hair Growth on Extremity: [Right:No] Temperature of Extremity: [Right:Warm] Capillary Refill: [Right:< 3 seconds] Dependent Rubor: [Right:No] Blanched when Elevated: [Right:No] Lipodermatosclerosis: [Right:No] Toe Nail Assessment Left: Right: Thick: Yes Discolored: Yes Deformed: Yes Improper Length and Hygiene: Yes Electronic Signature(s) Signed: 03/13/2017 11:10:26 AM By: Gretta Cool, BSN, RN, CWS, Kim RN, BSN Entered By: Gretta Cool, BSN, RN, CWS, Kim on 03/13/2017 10:23:45 [name], [name] ([id number]) -------------------------------------------------------------------------------- Multi Wound Chart Details Patient Name: [name], [name] Date of Service: 03/13/2017 10:15 AM Medical Record Number: [id number] Patient  Account Number: [id number] Date of Birth/Sex: Feb 09, 1941 (76 y.o. Female) Treating RN: Cornell Barman Primary Care Yurani Fettes: Beverlyn Roux Other Clinician: Referring Ameera Tigue: Beverlyn Roux Treating Jaydeen Darley/Extender: Melburn Hake, HOYT Weeks in Treatment: 18 Vital Signs Height(in): 64 Pulse(bpm): 78 Weight(lbs): 200 Blood Pressure 124/57 (mmHg): Body Mass Index(BMI): 34 Temperature(F): 97.8 Respiratory Rate 16 (breaths/min): Photos: [N/A:N/A] Wound Location: Right Metatarsal head first N/A N/A - Dorsal Wounding  Event: Gradually Appeared N/A N/A Primary Etiology: Diabetic Wound/Ulcer of N/A N/A the Lower Extremity Comorbid History: Cataracts, Chronic sinus N/A N/A problems/congestion, Congestive Heart Failure, Hypertension, Type II Diabetes Date Acquired: 08/06/2013 N/A N/A Weeks of Treatment: 18 N/A N/A Wound Status: Open N/A N/A Measurements L x W x D 2x0.6x0.5 N/A N/A (cm) Area (cm) : 0.942 N/A N/A Volume (cm) : 0.471 N/A N/A % Reduction in Area: 20.00% N/A N/A % Reduction in Volume: -33.40% N/A N/A Classification: Grade 2 N/A N/A Exudate Amount: Large N/A N/A Exudate Type: Serous N/A N/A Exudate Color: amber N/A N/A Wound Margin: Flat and Intact N/A N/A Rufer, Cheyenna V. ([id number]) Granulation Amount: Small (1-33%) N/A N/A Granulation Quality: Pink, Friable N/A N/A Necrotic Amount: Medium (34-66%) N/A N/A Exposed Structures: Fat Layer (Subcutaneous N/A N/A Tissue) Exposed: Yes Muscle: Yes Bone: Yes Fascia: No Tendon: No Joint: No Epithelialization: Small (1-33%) N/A N/A Periwound Skin Texture: Induration: Yes N/A N/A Scarring: Yes Periwound Skin Maceration: No N/A N/A Moisture: Periwound Skin Color: Ecchymosis: Yes N/A N/A Hemosiderin Staining: Yes Mottled: Yes Temperature: No Abnormality N/A N/A Tenderness on Yes N/A N/A Palpation: Wound Preparation: Ulcer Cleansing: N/A N/A Rinsed/Irrigated with Saline, Other: soap and water Topical  Anesthetic Applied: Other: lidociane 4% Treatment Notes Electronic Signature(s) Signed: 03/13/2017 11:10:26 AM By: Gretta Cool, BSN, RN, CWS, Kim RN, BSN Entered By: Gretta Cool, BSN, RN, CWS, Kim on 03/13/2017 10:25:19 [name], [name] ([id number]) -------------------------------------------------------------------------------- Multi-Disciplinary Care Plan Details Patient Name: [name], [name] Date of Service: 03/13/2017 10:15 AM Medical Record Number: [id number] Patient Account Number: [id number] Date of Birth/Sex: 1941-04-01 (76 y.o. Female) Treating RN: Cornell Barman Primary Care Harel Repetto: Beverlyn Roux Other Clinician: Referring Kleigh Hoelzer: Beverlyn Roux Treating Evadna Donaghy/Extender: Melburn Hake, HOYT Weeks in Treatment: 18 Active Inactive ` Abuse / Safety / Falls / Self Care Management Nursing Diagnoses: Impaired physical mobility Potential for falls Goals: Patient will remain injury free Date Initiated: 11/06/2016 Target Resolution Date: 12/30/2016 Goal Status: Active Patient/caregiver will verbalize understanding of skin care regimen Date Initiated: 11/06/2016 Target Resolution Date: 12/30/2016 Goal Status: Active Interventions: Assess fall risk on admission and as needed Treatment Activities: Patient referred to home care : 11/06/2016 Notes: ` Nutrition Nursing Diagnoses: Potential for alteratiion in Nutrition/Potential for imbalanced nutrition Goals: Patient/caregiver verbalizes understanding of need to maintain therapeutic glucose control per primary care physician Date Initiated: 11/06/2016 Target Resolution Date: 12/30/2016 Goal Status: Active Interventions: Provide education on elevated blood sugars and impact on wound healing [name], [name] ([id number]) Notes: ` Orientation to the Wound Care Program Nursing Diagnoses: Knowledge deficit related to the wound healing center program Goals: Patient/caregiver will verbalize understanding of the [place] Date Initiated:  11/06/2016 Target Resolution Date: 12/30/2016 Goal Status: Active Interventions: Provide education on orientation to the wound center Notes: ` Venous Leg Ulcer Nursing Diagnoses: Actual venous Insuffiency (use after diagnosis is confirmed) Knowledge deficit related to disease process and management Goals: Non-invasive venous studies are completed as ordered Date Initiated: 11/06/2016 Target Resolution Date: 12/30/2016 Goal Status: Active Patient/caregiver will verbalize understanding of disease process and disease management Date Initiated: 11/06/2016 Target Resolution Date: 12/30/2016 Goal Status: Active Interventions: Assess peripheral edema status every visit. Notes: ` Wound/Skin Impairment Nursing Diagnoses: Impaired tissue integrity Knowledge deficit related to smoking impact on wound healing Knowledge deficit related to ulceration/compromised skin integrity Goals: [name], [name] ([id number]) Ulcer/skin breakdown will heal within 14 weeks Date Initiated: 11/06/2016 Target Resolution Date: 02/05/2017 Goal Status: Active Interventions: Assess ulceration(s) every visit Treatment Activities: Skin care  regimen initiated : 11/06/2016 Notes: Electronic Signature(s) Signed: 03/13/2017 11:10:26 AM By: Gretta Cool, BSN, RN, CWS, Kim RN, BSN Entered By: Gretta Cool, BSN, RN, CWS, Kim on 03/13/2017 10:25:01 [name], [name] ([id number]) -------------------------------------------------------------------------------- Pain Assessment Details Patient Name: [name], [name] Date of Service: 03/13/2017 10:15 AM Medical Record Number: [id number] Patient Account Number: [id number] Date of Birth/Sex: 1941-03-20 (76 y.o. Female) Treating RN: Cornell Barman Primary Care Laniesha Das: Beverlyn Roux Other Clinician: Referring Ritu Gagliardo: Beverlyn Roux Treating Judea Fennimore/Extender: Melburn Hake, HOYT Weeks in Treatment: 18 Active Problems Location of Pain Severity and Description of Pain Patient Has Paino Yes Site Locations Pain  Location: Pain in Ulcers With Dressing Change: No Rate the pain. Current Pain Level: 4 Worst Pain Level: 7 Pain Management and Medication Current Pain Management: Goals for Pain Management Topical or injectable lidocaine is offered to patient for acute pain when surgical debridement is performed. If needed, Patient is instructed to use over the counter pain medication for the following 24-48 hours after debridement. Wound care MDs do not prescribed pain medications. Patient has chronic pain or uncontrolled pain. Patient has been instructed to make an appointment with their Primary Care Physician for pain management. Electronic Signature(s) Signed: 03/13/2017 11:10:26 AM By: Gretta Cool, BSN, RN, CWS, Kim RN, BSN Entered By: Gretta Cool, BSN, RN, CWS, Kim on 03/13/2017 10:15:20 [name], [name] ([id number]) -------------------------------------------------------------------------------- Wound Assessment Details Patient Name: [name], [name] V. Date of Service: 03/13/2017 10:15 AM Medical Record Number: [id number] Patient Account Number: [id number] Date of Birth/Sex: [birth year] (76 y.o. Female) Treating RN: Cornell Barman Primary Care Turkessa Ostrom: Beverlyn Roux Other Clinician: Referring Keeli Roberg: Beverlyn Roux Treating Giankarlo Leamer/Extender: Melburn Hake, HOYT Weeks in Treatment: 18 Wound Status Wound Number: 7 Primary Diabetic Wound/Ulcer of the Lower Etiology: Extremity Wound Location: Right Metatarsal head first - Dorsal Wound Open Status: Wounding Event: Gradually Appeared Comorbid Cataracts, Chronic sinus Date Acquired: 08/06/2013 History: problems/congestion, Congestive Heart Weeks Of Treatment: 18 Failure, Hypertension, Type II Diabetes Clustered Wound: No Photos Wound Measurements Length: (cm) 2 Width: (cm) 0.6 Depth: (cm) 0.5 Area: (cm) 0.942 Volume: (cm) 0.471 % Reduction in Area: 20% % Reduction in Volume: -33.4% Epithelialization: Small (1-33%) Tunneling: No Undermining: No Wound  Description Classification: Grade 2 Wound Margin: Flat and Intact Exudate Amount: Large Exudate Type: Serous Exudate Color: amber Foul Odor After Cleansing: No Slough/Fibrino Yes Wound Bed Granulation Amount: Small (1-33%) Exposed Structure Granulation Quality: Pink, Friable Fascia Exposed: No Necrotic Amount: Medium (34-66%) Fat Layer (Subcutaneous Tissue) Exposed: Yes Necrotic Quality: Adherent Slough Tendon Exposed: No Muscle Exposed: Yes Necrosis of Muscle: No Joint Exposed: No Erby, Veola V. ([id number]) Bone Exposed: Yes Periwound Skin Texture Texture Color No Abnormalities Noted: No No Abnormalities Noted: No Induration: Yes Ecchymosis: Yes Scarring: Yes Hemosiderin Staining: Yes Mottled: Yes Moisture No Abnormalities Noted: No Temperature / Pain Maceration: No Temperature: No Abnormality Tenderness on Palpation: Yes Wound Preparation Ulcer Cleansing: Rinsed/Irrigated with Saline, Other: soap and water, Topical Anesthetic Applied: Other: lidociane 4%, Treatment Notes Wound #7 (Right, Dorsal Metatarsal head first) 1. Cleansed with: Clean wound with Normal Saline 4. Dressing Applied: Other dressing (specify in notes) 5. Secondary Dressing Applied ABD and Kerlix/Conform 6. Footwear/Offloading device applied Surgical shoe 7. Secured with Paper tape Notes endoform Electronic Signature(s) Signed: 03/13/2017 11:10:26 AM By: Gretta Cool, BSN, RN, CWS, Kim RN, BSN Entered By: Gretta Cool, BSN, RN, CWS, Kim on 03/13/2017 10:33:01 Lafrance, [name] ([id number]) -------------------------------------------------------------------------------- Vitals Details Patient Name: Hoback, Boluwatife V. Date of Service: 03/13/2017 10:15 AM Medical Record Number: [id number] Patient Account Number: [id number] Date of  Birth/Sex: 1940/12/27 (76 y.o. Female) Treating RN: Cornell Barman Primary Care Kekai Geter: Beverlyn Roux Other Clinician: Referring Jashon Ishida: Beverlyn Roux Treating Kasiah Manka/Extender:  Melburn Hake, HOYT Weeks in Treatment: 18 Vital Signs Time Taken: 10:15 Temperature (F): 97.8 Height (in): 64 Pulse (bpm): 78 Weight (lbs): 200 Respiratory Rate (breaths/min): 16 Body Mass Index (BMI): 34.3 Blood Pressure (mmHg): 124/57 Reference Range: 80 - 120 mg / dl Electronic Signature(s) Signed: 03/13/2017 11:10:26 AM By: Gretta Cool, BSN, RN, CWS, Kim RN, BSN Entered By: Gretta Cool, BSN, RN, CWS, Kim on 03/13/2017 10:15:39

## 2017-03-20 ENCOUNTER — Ambulatory Visit: Payer: Medicare HMO | Admitting: Physician Assistant

## 2017-03-26 ENCOUNTER — Encounter: Payer: Medicare HMO | Admitting: Internal Medicine

## 2017-03-26 DIAGNOSIS — E11622 Type 2 diabetes mellitus with other skin ulcer: Secondary | ICD-10-CM | POA: Diagnosis not present

## 2017-03-27 NOTE — Progress Notes (Signed)
[name] ([id number]) Visit Report for 03/26/2017 Arrival Information Details Patient Name: [name], [name] Date of Service: 03/26/2017 10:15 AM Medical Record Number: [id number] Patient Account Number: [id number] Date of Birth/Sex: 1940/10/04 (76 y.o. Female) Treating RN: [name] Primary Care [name]: [name] Other Clinician: Referring [name]: [name] Treating Kaleena Corrow/Extender: [name] in Treatment: 20 Visit Information History Since Last Visit Added or deleted any medications: No Patient Arrived: Wheel Chair Any new allergies or adverse reactions: No Arrival Time: 10:32 Had a fall or experienced change in No activities of daily living that may affect Accompanied By: husband risk of falls: Transfer Assistance: Manual Signs or symptoms of abuse/neglect since last No Patient Identification Verified: Yes visito Secondary Verification Process Yes Hospitalized since last visit: No Completed: Has Dressing in Place as Prescribed: Yes Patient Requires Transmission-Based No Pain Present Now: No Precautions: Patient Has Alerts: No Notes Patient transfers with walker. Electronic Signature(s) Signed: 03/27/2017 9:18:28 AM By: [name], BSN, RN, CWS, Kim RN, BSN Previous Signature: 03/26/2017 4:57:33 PM Version By: [name], BSN, RN, CWS, Kim RN, BSN Entered By: [name], BSN, RN, CWS, [name] on 03/27/2017 09:02:41 [name] ([id number]) -------------------------------------------------------------------------------- Clinic Level of Care Assessment Details Patient Name: [name], [name] Date of Service: 03/26/2017 10:15 AM Medical Record Number: [id number] Patient Account Number: [id number] Date of Birth/Sex: 1941/02/22 (76 y.o. Female) Treating RN: [name] Primary Care Lyfe Monger: [name] Other Clinician: Referring Adham Johnson: [name] Treating Linah Klapper/Extender: [name] in Treatment: 20 Clinic Level of Care Assessment Items TOOL 4  Quantity Score []  - Use when only an EandM is performed on FOLLOW-UP visit 0 ASSESSMENTS - Nursing Assessment / Reassessment []  - Reassessment of Co-morbidities (includes updates in patient status) 0 X - Reassessment of Adherence to Treatment Plan 1 5 ASSESSMENTS - Wound and Skin Assessment / Reassessment X - Simple Wound Assessment / Reassessment - one wound 1 5 []  - Complex Wound Assessment / Reassessment - multiple wounds 0 []  - Dermatologic / Skin Assessment (not related to wound area) 0 ASSESSMENTS - Focused Assessment []  - Circumferential Edema Measurements - multi extremities 0 []  - Nutritional Assessment / Counseling / Intervention 0 []  - Lower Extremity Assessment (monofilament, tuning fork, pulses) 0 []  - Peripheral Arterial Disease Assessment (using hand held doppler) 0 ASSESSMENTS - Ostomy and/or Continence Assessment and Care []  - Incontinence Assessment and Management 0 []  - Ostomy Care Assessment and Management (repouching, etc.) 0 PROCESS - Coordination of Care X - Simple Patient / Family Education for ongoing care 1 15 []  - Complex (extensive) Patient / Family Education for ongoing care 0 X - Staff obtains Programmer, systems, Records, Test Results / Process Orders 1 10 []  - Staff telephones HHA, Nursing Homes / Clarify orders / etc 0 []  - Routine Transfer to another Facility (non-emergent condition) 0 [name], [name] ([id number]) []  - Routine Hospital Admission (non-emergent condition) 0 []  - New Admissions / Biomedical engineer / Ordering NPWT, Apligraf, etc. 0 []  - Emergency Hospital Admission (emergent condition) 0 X - Simple Discharge Coordination 1 10 []  - Complex (extensive) Discharge Coordination 0 PROCESS - Special Needs []  - Pediatric / Minor Patient Management 0 []  - Isolation Patient Management 0 []  - Hearing / Language / Visual special needs 0 []  - Assessment of Community assistance (transportation, D/C planning, etc.) 0 []  - Additional assistance / Altered  mentation 0 []  - Support Surface(s) Assessment (bed, cushion, seat, etc.) 0 INTERVENTIONS - Wound Cleansing / Measurement X - Simple Wound  Cleansing - one wound 1 5 []  - Complex Wound Cleansing - multiple wounds 0 X - Wound Imaging (photographs - any number of wounds) 1 5 []  - Wound Tracing (instead of photographs) 0 X - Simple Wound Measurement - one wound 1 5 []  - Complex Wound Measurement - multiple wounds 0 INTERVENTIONS - Wound Dressings []  - Small Wound Dressing one or multiple wounds 0 X - Medium Wound Dressing one or multiple wounds 1 15 []  - Large Wound Dressing one or multiple wounds 0 []  - Application of Medications - topical 0 []  - Application of Medications - injection 0 INTERVENTIONS - Miscellaneous []  - External ear exam 0 [name], [name] ([id number]) []  - Specimen Collection (cultures, biopsies, blood, body fluids, etc.) 0 []  - Specimen(s) / Culture(s) sent or taken to Lab for analysis 0 []  - Patient Transfer (multiple staff / [name] Lift / Similar devices) 0 []  - Simple Staple / Suture removal (25 or less) 0 []  - Complex Staple / Suture removal (26 or more) 0 []  - Hypo / Hyperglycemic Management (close monitor of Blood Glucose) 0 []  - Ankle / Brachial Index (ABI) - do not check if billed separately 0 X - Vital Signs 1 5 Has the patient been seen at the hospital within the last three years: Yes Total Score: 80 Level Of Care: New/Established - Level 3 Electronic Signature(s) Signed: 03/27/2017 9:18:28 AM By: [name], BSN, RN, CWS, Kim RN, BSN Previous Signature: 03/26/2017 4:57:33 PM Version By: [name], BSN, RN, CWS, Kim RN, BSN Entered By: [name], BSN, RN, CWS, [name] on 03/27/2017 09:04:11 [name] ([id number]) -------------------------------------------------------------------------------- Encounter Discharge Information Details Patient Name: [name], [name] Date of Service: 03/26/2017 10:15 AM Medical Record Number: [id number] Patient Account Number: [id number] Date  of Birth/Sex: Jul 01, 1941 (76 y.o. Female) Treating RN: [name] Primary Care Wm Sahagun: [name] Other Clinician: Referring Jamarques Pinedo: [name] Treating Ronny Korff/Extender: [name] in Treatment: 20 Encounter Discharge Information Items Discharge Pain Level: 0 Discharge Condition: Stable Ambulatory Status: Ambulatory Discharge Destination: Home Transportation: Private Auto Accompanied By: husband Schedule Follow-up Appointment: Yes Medication Reconciliation completed and provided to Patient/Care Yes Laveta Gilkey: Provided on Clinical Summary of Care: 03/26/2017 Form Type Recipient Paper Patient [facility] Electronic Signature(s) Signed: 03/27/2017 9:18:28 AM By: [name], BSN, RN, CWS, Kim RN, BSN Previous Signature: 03/26/2017 4:57:33 PM Version By: [name], BSN, RN, CWS, Kim RN, BSN Entered By: [name], BSN, RN, CWS, [name] on 03/27/2017 09:05:30 Severs, [name] ([id number]) -------------------------------------------------------------------------------- Lower Extremity Assessment Details Patient Name: Dirusso, Shakita V. Date of Service: 03/26/2017 10:15 AM Medical Record Number: [id number] Patient Account Number: [id number] Date of Birth/Sex: [birth year] (76 y.o. Female) Treating RN: [name] Primary Care Ryleeann Urquiza: [name] Other Clinician: Referring Trevontae Lindahl: [name] Treating Narcissa Melder/Extender: [name] in Treatment: 20 Vascular Assessment Pulses: Dorsalis Pedis Palpable: [Right:Yes] Posterior Tibial Extremity colors, hair growth, and conditions: Extremity Color: [Right:Mottled] Hair Growth on Extremity: [Right:No] Temperature of Extremity: [Right:Warm] Capillary Refill: [Right:< 3 seconds] Dependent Rubor: [Right:No] Blanched when Elevated: [Right:No] Lipodermatosclerosis: [Right:No] Toe Nail Assessment Left: Right: Thick: No Discolored: No Deformed: No Improper Length and Hygiene: No Electronic Signature(s) Signed: 03/27/2017 9:18:28 AM By:  [name], BSN, RN, CWS, Kim RN, BSN Previous Signature: 03/26/2017 4:57:33 PM Version By: [name], BSN, RN, CWS, Kim RN, BSN Entered By: [name], BSN, RN, CWS, [name] on 03/27/2017 09:03:07 Santoyo, Latecia [name] ([id number]) -------------------------------------------------------------------------------- Multi Wound Chart Details Patient Name: Mccormac, Kaleah V. Date of Service: 03/26/2017 10:15 AM Medical Record Number: [id number] Patient Account  Number: [id number] Date of Birth/Sex: 02/09/1941 (76 y.o. Female) Treating RN: [name] Primary Care Manav Pierotti: [name] Other Clinician: Referring Sarra Rachels: [name] Treating Kailee Essman/Extender: [name] in Treatment: 20 Vital Signs Height(in): 64 Pulse(bpm): 91 Weight(lbs): 200 Blood Pressure 142/55 (mmHg): Body Mass Index(BMI): 34 Temperature(F): 98.1 Respiratory Rate 16 (breaths/min): Photos: [N/A:N/A] Wound Location: Right Metatarsal head first N/A N/A - Dorsal Wounding Event: Gradually Appeared N/A N/A Primary Etiology: Diabetic Wound/Ulcer of N/A N/A the Lower Extremity Comorbid History: Cataracts, Chronic sinus N/A N/A problems/congestion, Congestive Heart Failure, Hypertension, Type II Diabetes Date Acquired: 08/06/2013 N/A N/A Weeks of Treatment: 20 N/A N/A Wound Status: Open N/A N/A Measurements L x W x D 2x0.6x0.4 N/A N/A (cm) Area (cm) : 0.942 N/A N/A Volume (cm) : 0.377 N/A N/A % Reduction in Area: 20.00% N/A N/A % Reduction in Volume: -6.80% N/A N/A Classification: Grade 2 N/A N/A Exudate Amount: Large N/A N/A Exudate Type: Serous N/A N/A Exudate Color: amber N/A N/A Wound Margin: Flat and Intact N/A N/A [name], [name] ([id number]) Granulation Amount: Small (1-33%) N/A N/A Granulation Quality: Pink, Friable N/A N/A Necrotic Amount: Medium (34-66%) N/A N/A Exposed Structures: Fat Layer (Subcutaneous N/A N/A Tissue) Exposed: Yes Muscle: Yes Fascia: No Tendon: No Joint: No Bone:  No Epithelialization: Small (1-33%) N/A N/A Periwound Skin Texture: Induration: Yes N/A N/A Scarring: Yes Periwound Skin Maceration: No N/A N/A Moisture: Periwound Skin Color: Ecchymosis: Yes N/A N/A Hemosiderin Staining: Yes Mottled: Yes Temperature: No Abnormality N/A N/A Tenderness on Yes N/A N/A Palpation: Wound Preparation: Ulcer Cleansing: N/A N/A Rinsed/Irrigated with Saline, Other: soap and water Topical Anesthetic Applied: Other: lidociane 4% Treatment Notes Wound #7 (Right, Dorsal Metatarsal head first) 1. Cleansed with: Clean wound with Normal Saline 2. Anesthetic Topical Lidocaine 4% cream to wound bed prior to debridement 4. Dressing Applied: Other dressing (specify in notes) 5. Secondary Dressing Applied Foam Kerlix/Conform Notes endoform Electronic Signature(s) Signed: 03/27/2017 9:18:28 AM By: [name], BSN, RN, CWS, Kim RN, BSN Previous Signature: 03/26/2017 5:02:24 PM Version By: Linton Ham MD Pearlman, ILY DENNO ([id number]) Entered By: [name] BSN, RN, CWS, [name] on 03/27/2017 09:03:34 Steury, [name] ([id number]) -------------------------------------------------------------------------------- [place] Details Patient Name: [name], [name] Date of Service: 03/26/2017 10:15 AM Medical Record Number: [id number] Patient Account Number: [id number] Date of Birth/Sex: 1941/01/16 (76 y.o. Female) Treating RN: [name] Primary Care Rossie Bretado: [name] Other Clinician: Referring Antonious Omahoney: [name] Treating Alashia Brownfield/Extender: [name] in Treatment: 20 Active Inactive ` Abuse / Safety / Falls / Self Care Management Nursing Diagnoses: Impaired physical mobility Potential for falls Goals: Patient will remain injury free Date Initiated: 11/06/2016 Target Resolution Date: 12/30/2016 Goal Status: Active Patient/caregiver will verbalize understanding of skin care regimen Date Initiated: 11/06/2016 Target Resolution Date:  12/30/2016 Goal Status: Active Interventions: Assess fall risk on admission and as needed Treatment Activities: Patient referred to home care : 11/06/2016 Notes: ` Nutrition Nursing Diagnoses: Potential for alteratiion in Nutrition/Potential for imbalanced nutrition Goals: Patient/caregiver verbalizes understanding of need to maintain therapeutic glucose control per primary care physician Date Initiated: 11/06/2016 Target Resolution Date: 12/30/2016 Goal Status: Active Interventions: Provide education on elevated blood sugars and impact on wound healing Zappulla, Shawntee V. ([id number]) Notes: ` Orientation to the Wound Care Program Nursing Diagnoses: Knowledge deficit related to the wound healing center program Goals: Patient/caregiver will verbalize understanding of the [place] Date Initiated: 11/06/2016 Target Resolution Date: 12/30/2016 Goal Status: Active Interventions: Provide education on orientation to the wound  center Notes: ` Venous Leg Ulcer Nursing Diagnoses: Actual venous Insuffiency (use after diagnosis is confirmed) Knowledge deficit related to disease process and management Goals: Non-invasive venous studies are completed as ordered Date Initiated: 11/06/2016 Target Resolution Date: 12/30/2016 Goal Status: Active Patient/caregiver will verbalize understanding of disease process and disease management Date Initiated: 11/06/2016 Target Resolution Date: 12/30/2016 Goal Status: Active Interventions: Assess peripheral edema status every visit. Notes: ` Wound/Skin Impairment Nursing Diagnoses: Impaired tissue integrity Knowledge deficit related to smoking impact on wound healing Knowledge deficit related to ulceration/compromised skin integrity Goals: [name], [name] ([id number]) Ulcer/skin breakdown will heal within 14 weeks Date Initiated: 11/06/2016 Target Resolution Date: 02/05/2017 Goal Status: Active Interventions: Assess ulceration(s) every  visit Treatment Activities: Skin care regimen initiated : 11/06/2016 Notes: Electronic Signature(s) Signed: 03/27/2017 9:18:28 AM By: [name], BSN, RN, CWS, Kim RN, BSN Previous Signature: 03/26/2017 4:57:33 PM Version By: [name], BSN, RN, CWS, Kim RN, BSN Entered By: [name], BSN, RN, CWS, [name] on 03/27/2017 09:03:17 [name], [name] ([id number]) -------------------------------------------------------------------------------- Pain Assessment Details Patient Name: [name], [name] Date of Service: 03/26/2017 10:15 AM Medical Record Number: [id number] Patient Account Number: [id number] Date of Birth/Sex: 03-25-1941 (76 y.o. Female) Treating RN: [name] Primary Care Auriah Hollings: [name] Other Clinician: Referring Wrigley Winborne: [name] Treating Kyndall Chaplin/Extender: [name] in Treatment: 20 Active Problems Location of Pain Severity and Description of Pain Patient Has Paino No Site Locations With Dressing Change: No Pain Management and Medication Current Pain Management: Goals for Pain Management Topical or injectable lidocaine is offered to patient for acute pain when surgical debridement is performed. If needed, Patient is instructed to use over the counter pain medication for the following 24-48 hours after debridement. Wound care MDs do not prescribed pain medications. Patient has chronic pain or uncontrolled pain. Patient has been instructed to make an appointment with their Primary Care Physician for pain management. Electronic Signature(s) Signed: 03/27/2017 9:18:28 AM By: [name], BSN, RN, CWS, Kim RN, BSN Previous Signature: 03/26/2017 4:57:33 PM Version By: [name], BSN, RN, CWS, Kim RN, BSN Entered By: [name], BSN, RN, CWS, [name] on 03/27/2017 09:02:48 [name], [name] ([id number]) -------------------------------------------------------------------------------- Patient/Caregiver Education Details Patient Name: [name], [name] Date of Service: 03/26/2017 10:15 AM Medical Record  Patient Account Number: [id number] Number: Treating RN: [name] 09-23-1940 (76 y.o. Other Clinician: Date of Birth/Gender: Female) Treating ROBSON, [name] Primary Care Physician: [name] Physician/Extender: G Referring Physician: Dorthula Nettles in Treatment: 53 Education Assessment Education Provided To: Patient and Caregiver Education Topics Provided Wound/Skin Impairment: Handouts: Caring for Your Ulcer, Other: Orders sent to [facility] Methods: Demonstration, Explain/Verbal Responses: State content correctly Electronic Signature(s) Signed: 03/27/2017 9:18:28 AM By: [name], BSN, RN, CWS, Kim RN, BSN Entered By: [name], BSN, RN, CWS, [name] on 03/27/2017 09:05:55 Vanwyhe, [name] ([id number]) -------------------------------------------------------------------------------- Wound Assessment Details Patient Name: [name], [name] Date of Service: 03/26/2017 10:15 AM Medical Record Number: [id number] Patient Account Number: [id number] Date of Birth/Sex: 1941-04-16 (75 y.o. Female) Treating RN: [name] Primary Care Zamar Odwyer: [name] Other Clinician: Referring Johanna Stafford: [name] Treating Finnbar Cedillos/Extender: Melburn Hake, HOYT Weeks in Treatment: 20 Wound Status Wound Number: 7 Primary Diabetic Wound/Ulcer of the Lower Etiology: Extremity Wound Location: Right Metatarsal head first - Dorsal Wound Open Status: Wounding Event: Gradually Appeared Comorbid Cataracts, Chronic sinus Date Acquired: 08/06/2013 History: problems/congestion, Congestive Heart Weeks Of Treatment: 20 Failure, Hypertension, Type II Diabetes Clustered Wound: No Photos Wound Measurements Length: (cm) 2 Width: (cm) 0.6 Depth: (cm) 0.4 Area: (cm) 0.942 Volume: (  cm) 0.377 % Reduction in Area: 20% % Reduction in Volume: -6.8% Epithelialization: Small (1-33%) Tunneling: No Undermining: No Wound Description Classification: Grade 2 Wound Margin: Flat and Intact Exudate Amount:  Large Exudate Type: Serous Exudate Color: amber Foul Odor After Cleansing: No Slough/Fibrino Yes Wound Bed Granulation Amount: Small (1-33%) Exposed Structure Granulation Quality: Pink, Friable Fascia Exposed: No Necrotic Amount: Medium (34-66%) Fat Layer (Subcutaneous Tissue) Exposed: Yes Necrotic Quality: Adherent Slough Tendon Exposed: No Muscle Exposed: Yes Necrosis of Muscle: No Joint Exposed: No Skare, Duha V. ([id number]) Bone Exposed: No Periwound Skin Texture Texture Color No Abnormalities Noted: No No Abnormalities Noted: No Induration: Yes Ecchymosis: Yes Scarring: Yes Hemosiderin Staining: Yes Mottled: Yes Moisture No Abnormalities Noted: No Temperature / Pain Maceration: No Temperature: No Abnormality Tenderness on Palpation: Yes Wound Preparation Ulcer Cleansing: Rinsed/Irrigated with Saline, Other: soap and water, Topical Anesthetic Applied: Other: lidociane 4%, Treatment Notes Wound #7 (Right, Dorsal Metatarsal head first) 1. Cleansed with: Clean wound with Normal Saline 2. Anesthetic Topical Lidocaine 4% cream to wound bed prior to debridement 4. Dressing Applied: Other dressing (specify in notes) 5. Secondary Dressing Applied Foam Kerlix/Conform Notes endoform Electronic Signature(s) Signed: 03/26/2017 4:57:33 PM By: [name], BSN, RN, CWS, Kim RN, BSN Entered By: [name], BSN, RN, CWS, [name] on 03/26/2017 10:40:51 Chilton, [name] ([id number]) -------------------------------------------------------------------------------- Vitals Details Patient Name: [name], [name] Date of Service: 03/26/2017 10:15 AM Medical Record Number: [id number] Patient Account Number: [id number] Date of Birth/Sex: 1941/06/11 (76 y.o. Female) Treating RN: [name] Primary Care Dwight Adamczak: [name] Other Clinician: Referring Haruki Arnold: [name] Treating Elvina Bosch/Extender: [name] in Treatment: 20 Vital Signs Time Taken: 10:33 Temperature (F):  98.1 Height (in): 64 Pulse (bpm): 91 Weight (lbs): 200 Respiratory Rate (breaths/min): 16 Body Mass Index (BMI): 34.3 Blood Pressure (mmHg): 142/55 Reference Range: 80 - 120 mg / dl Electronic Signature(s) Signed: 03/27/2017 9:18:28 AM By: [name], BSN, RN, CWS, Kim RN, BSN Previous Signature: 03/26/2017 4:57:33 PM Version By: [name], BSN, RN, CWS, Kim RN, BSN Entered By: [name], BSN, RN, CWS, [name] on 03/27/2017 09:02:56

## 2017-03-30 NOTE — Progress Notes (Signed)
DAGMAR, ADCOX ([id number]) Visit Report for 03/26/2017 HPI Details Patient Name: [name], [name] Date of Service: 03/26/2017 10:15 AM Medical Record Patient Account Number: [id number] Number: Treating RN: Cornell Barman 02/08/1941 (76 y.o. Other Clinician: Date of Birth/Sex: Female) Treating ROBSON, [name] Primary Care Provider: Beverlyn Roux Provider/Extender: G Referring Provider: Dorthula Nettles in Treatment: 20 History of Present Illness HPI Description: 08/13/16: This is a 76 year old woman who came predominantly for review of 3 cm in diameter circular wound to the left anterior lateral leg. She was in the ER on 08/01/16 I reviewed their notes. There was apparently pus coming out of the wound at that time and the patient arrived requesting debridement which they don't do in the emergency room. Nevertheless I can't see that they did any x-rays. There were no cultures done. She is a type II diabetic and I a note after the patient was in the clinic that she had a bypass graft from the popliteal to the tibial on the right on 02/28/16. She also had a right greater saphenous vein harvest on the same date for arterial bypass. She is going to have vascular studies including ABIs T ABIs on the right on 08/28/16. The patient's surgery was on 02/28/16 by Dr. Vallarie Mare she had a right below the knee popliteal artery to peroneal artery bypass with reverse greater saphenous vein and an endarterectomy of the mid segment peroneal artery. Postoperatively she had a strong mild monophasic peroneal signal with a pink foot. It would appear that the patient is had some nonhealing in the surgical saphenous vein harvest site on the left leg. Surprisingly looking [facility] I cannot see much information about this at all. Dr. Lucious Groves notes from 05/29/16 show that the patient's wounds "are not healed" the right first metatarsal wound healed but then opened back up. 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 At that point the patient had a wound VAC to her right calf with regards to the wounds on her dorsal right toe would appear that these are felt to be arterial wounds. She has had surgery on the metatarsal phalangeal in 2015 I Dr. Doran Durand secondary to a right metatarsal phalangeal joint fracture. She is apparently had discoloration around this area since then. 08/28/16; patient arrives with her wounds in much the same condition. The linear vein harvest site and the circular wound below it which I think was a blister. She also has to probing holes in her right great toe and a necrotic eschar on the right second toe. Because of these being arterial wounds I reduced her compression from 3-2 layers this seems to of done satisfactorily she has not had any problems. I cannot see that she is actually had an x-ray ====== 11/06/16 the patient comes in for evaluation of her right lower extremity ulcers. She was here in January 2018 for 2 visits subsequently ended up in the hospital with pneumonia and then to rehabilitation. She has now been discharged from rehabilitation and is home. She has multiple ulcerations to her right lower extremity including the foot and toes. She does have home health in place and they have been placing alginate to the Ramseyer, Jeda V. ([id number]) ulcers. She is followed by Dr. Bridgett Larsson of vascular medicine. 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 She recently saw him on 3/23.  In office ABIs were: Right 0.48 with monophasic flow to the DP, PT, peroneal Left 0.63 with monophasic flow to the DP and PT Her arterial studies indicated a patent right below knee popliteal to peroneal bypass She had an MRI in February 2008 that was negative frosty myelitis but this showed general soft tissue edema in the right  foot and lower extremity concerning for cellulitis She is a diabetic, managed with insulin. Her hemoglobin A1c in December 2017 was 8.4 which is a trend up from previous levels. She had blood work in February 2018 which revealed an albumin of 2.6 this appears to be relatively acute as an albumin in November 2017 was 3.7 11/13/16; this is a patient I have not seen since February who is readmitted to our clinic last week. 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 Bridgett Larsson. I have reviewed Dr. Lianne Moris notes from March/23/18. Doppler ABI on that date showed an ABI on the right of 0.48 and on the left of 0.63. Dorsalis pedis waveforms were monophasic bilaterally. There was no waveforms detected at the posterior tibial on the right, monophasic on the left. Dr. Lianne Moris comments were that this patient would have follow-up vascular studies in 3 months including ABIs and right lower extremity arterial duplex. She had an MRI in February that was negative for osteomyelitis but showed generalized edema in the foot. Last albumin I see was in January at 3.4 we have been using Santyl to the 4 wounds on the right leg the patient is noted today to have widespread edema well up towards her groin this is pitting 2-3+. 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 Normal ejection fraction. 11/20/16; patient has a follow-up appointment with Dr. Bridgett Larsson on April 23. She is still complaining of a lot of pain in the right foot and right leg. It is not clear to me that this is at all positional however I think it is clear claudication with minimal activity perhaps at rest. At our suggestion she is return to her primary physician's office tomorrow with regards to her pitting lower extremity bilateral edema that I reviewed in detail last week 11/27/16; the follow-up with Dr. Bridgett Larsson was actually on May 23 on April  23 as I stated in my note last week. N/A case all of her wounds seems somewhat smaller. The 2 on the right leg are definitely smaller. The areas on the dorsal right first toe, right third toe and the lateral part of the right fifth metatarsal head all looks smaller but have tightly adherent surfaces. We have been using Santyl 12/04/16; follow-up with Dr. Bridgett Larsson on May 23. 2 small open wounds on the right leg continue to get smaller. The area on the right third total lateral aspect of the right fifth toe also look better. The remaining area on the dorsal first toe still has some depth to it. We have been using Santyl to the toes and collagen on the right 12/11/16; according to patient's husband the follow-up with Dr. Bridgett Larsson is not until July. All of the wounds on the right leg are measuring smaller. We have been using some combination of Prisma and Santyl although I think we can go to straight Prisma today. There may have been some confusion with home health about the primary dressing orders here. 12/25/16; the patient has had some healing this week. The area on her right  lateral fifth metatarsal head, right third toe are both healed lower right leg is healed. In the vein harvest site superiorly she has one superficial open area. On the dorsal aspect of her right great toe/MTP joint the wound is now divided into 2 however the proximal area is deep and there is palpable bone 01/01/17; still an open area in the middle of her original right surgical scar. The area on the right third toe and right lateral fifth metatarsal head remained closed. Problematic area on the dorsal aspect of the toe. Previous surgery in this area line 01/08/17 small open area on the original scar on her upper anterior leg although this is closing. X-ray I did of the right first toe did not show underlying bony abnormality. Still this area on the dorsal first toe probes to bone. We have been using [place], Shantele V.  ([id number]) 01/15/17; small open area in the original scar in the upper anterior leg is almost fully closed. She has 2 open areas over the dorsal aspect of the right first toe that probes to bone. Used and a form starting last week. Vigorous bone scraping that I did last week showed few methicillin sensitive staph aureus. She is allergic to penicillin and sulfa drugs. I'm going to give her 2 weeks of doxycycline. She will need an MRI with contrast. 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 She continues on doxycycline for a bone scraping that showed methicillin sensitive staph aureus [allergic to penicillin and sulfa]. We have been using Endo form to the wound. 01/29/17; surprisingly her MRI did not show osteomyelitis. She continues on doxycycline for a bone scraping that showed methicillin sensitive staph aureus with allergies to penicillin and sulfa. We have been using Endo form to the wound. Unfortunately I cannot get a surface on this visit looks like it is able to support a healing state. Proximally there is still exposed bone. There is no overt soft tissue tenderness. Her MRI did show a previous fracture was surgery to this area but no hardware 02/12/17 on evaluation today patient appears to be doing well in regard to her lower extremity wound. She does have some mild discomfort but this is minimal. There's no evidence of infection. Her left great toe nail has somewhat been lifted up and there is a little bit of slight bleeding underneath but this is still firmly attached. 02/19/17; she ran out of doxycycline 2 days ago. Nevertheless I would like to continue this for 2 weeks to make a full 6 weeks of therapy as the bone scraping that I did from the open area on the dorsal right first toe showed MRSA. This should complete antibiotic therapy. 02/26/17; the patient is completing her 6 weeks of oral doxycycline. Deep  wound on the dorsal right toe. This still has some exposed bone proximally. She does have a reasonably granulated surface albeit thin over bone. I've been using Endo form have made application for an amniotic skin sub 03/05/17; the patient has completed 6 weeks of doxycycline. This is a deep wound on the dorsal right toe which has exposed bone proximally. She has granulation over most of this wound albeit a thin layer. I've been using Endo form. Still do not have approval for Affinity 03/13/17 on evaluation today patient's right foot wound appears to be doing better measurement wise compared to her last evaluation. She has been tolerating the dressing  change without complication and does have a appointment with the dietary that has been made as far as referral is concerned there just waiting for her and transportation to contact them back for an actual date. Nonetheless patient has been having less pain at this point. No fevers, chills, nausea, or vomiting noted at this time. 03/26/17; linear wound over the dorsal aspect of her right great toe. At one point this had exposed bone on the most superior aspect however I am pleased to see today that this appears to have a surface of granulation. Apparently product was applied for but denied by [facility]. We'll need to see what that was. The patient has known PAD status post revascularization. MRI did not show osteomyelitis which was done in June Electronic Signature(s) Signed: 03/27/2017 9:18:28 AM By: Gretta Cool, BSN, RN, CWS, Kim RN, BSN Signed: 03/30/2017 12:13:36 PM By: Linton Ham MD Previous Signature: 03/26/2017 5:02:24 PM Version By: Linton Ham MD Entered By: Gretta Cool, BSN, RN, CWS, Kim on 03/27/2017 09:04:43 Thom, [name] ([id number]) -------------------------------------------------------------------------------- Physical Exam Details Patient Name: Olivier, Ethleen V. Date of Service: 03/26/2017 10:15 AM Medical Record Patient Account Number:  [id number] Number: Treating RN: Cornell Barman 1941-02-01 (76 y.o. Other Clinician: Date of Birth/Sex: Female) Treating ROBSON, [name] Primary Care Provider: Beverlyn Roux Provider/Extender: G Referring Provider: Dorthula Nettles in Treatment: 20 Constitutional Sitting or standing Blood Pressure is within target range for patient.. Pulse regular and within target range for patient.[name] Respirations regular, non-labored and within target range.. Temperature is normal and within the target range for the patient.[name] appears in no distress. Eyes Conjunctivae clear. No discharge. Respiratory Respiratory effort is easy and symmetric bilaterally. Rate is normal at rest and on room air.. Bilateral breath sounds are clear and equal in all lobes with no wheezes, rales or rhonchi.. Cardiovascular Jugular venous pressure is borderline 2 to 3/6 midsystolic murmur. Pedal pulses faintly palpable at the dorsalis pedis. Edema present in both extremities. This is tense and nonpitting likely lymphedema. Lymphatic None palpable in the popliteal or inguinal area. Psychiatric No evidence of depression, anxiety, or agitation. Calm, cooperative, and communicative. Appropriate interactions and affect.. Notes Wound exam; I was really pleased to see how this patient is doing today. She does not have palpable or obvious visual exposed bone. This is a big improvement since I saw this 3 weeks ago. We have been using Endoform. There is no evidence of surrounding infection. Electronic Signature(s) Signed: 03/27/2017 9:18:28 AM By: Gretta Cool, BSN, RN, CWS, Kim RN, BSN Signed: 03/30/2017 12:13:36 PM By: Linton Ham MD Previous Signature: 03/26/2017 5:02:24 PM Version By: Linton Ham MD Entered By: Gretta Cool, BSN, RN, CWS, Kim on 03/27/2017 09:04:51 Zaborowski, [name] ([id number]) -------------------------------------------------------------------------------- Physician Orders Details Patient Name: Redditt, Zoiee  V. Date of Service: 03/26/2017 10:15 AM Medical Record Patient Account Number: [id number] Number: Treating RN: Cornell Barman 1941-05-23 (76 y.o. Other Clinician: Date of Birth/Sex: Female) Treating ROBSON, [name] Primary Care Provider: Beverlyn Roux Provider/Extender: G Referring Provider: Dorthula Nettles in Treatment: 20 Verbal / Phone Orders: No Diagnosis Coding Wound Cleansing Wound #7 Right,Dorsal Metatarsal head first o Clean wound with Normal Saline. o Cleanse wound with mild soap and water Anesthetic Wound #7 Right,Dorsal Metatarsal head first o Topical Lidocaine 4% cream applied to wound bed prior to debridement Primary Wound Dressing Wound #7 Right,Dorsal Metatarsal head first o Other: - ENDOFORM Secondary Dressing Wound #7 Right,Dorsal Metatarsal head first o Conform/Kerlix o Foam Dressing Change Frequency Wound #7 Right,Dorsal Metatarsal head first o  Change Dressing Monday, Wednesday, Friday Follow-up Appointments Wound #7 Right,Dorsal Metatarsal head first o Return Appointment in 1 week. Edema Control Wound #7 Right,Dorsal Metatarsal head first o Elevate legs to the level of the heart and pump ankles as often as possible Additional Orders / Instructions Wound #7 Right,Dorsal Metatarsal head first o Increase protein intake. Leiner, BRITTAN [name] ([id number]) o Activity as tolerated Home Health Wound #7 Right,Dorsal Metatarsal head first o Nina Visits - [facility] twice weekly, [facility]-Wednesday o Home Health Nurse may visit PRN to address patientos wound care needs. o FACE TO FACE ENCOUNTER: MEDICARE and MEDICAID PATIENTS: I certify that this patient is under my care and that I had a face-to-face encounter that meets the physician face-to-face encounter requirements with this patient on this date. The encounter with the patient was in whole or in part for the following MEDICAL CONDITION: (primary reason  for [place]) MEDICAL NECESSITY: I certify, that based on my findings, NURSING services are a medically necessary home health service. HOME BOUND STATUS: I certify that my clinical findings support that this patient is homebound (i.e., Due to illness or injury, pt requires aid of supportive devices such as crutches, cane, wheelchairs, walkers, the use of special transportation or the assistance of another person to leave their place of residence. There is a normal inability to leave the home and doing so requires considerable and taxing effort. Other absences are for medical reasons / religious services and are infrequent or of short duration when for other reasons). o If current dressing causes regression in wound condition, may D/C ordered dressing product/s and apply Normal Saline Moist Dressing daily until next [place] / Other MD appointment. [place] of regression in wound condition at [phone]. o Please direct any NON-WOUND related issues/requests for orders to patient's Primary Care Physician Electronic Signature(s) Signed: 03/27/2017 9:18:28 AM By: Gretta Cool, BSN, RN, CWS, Kim RN, BSN Signed: 03/30/2017 12:13:36 PM By: Linton Ham MD Previous Signature: 03/26/2017 4:57:33 PM Version By: Gretta Cool, BSN, RN, CWS, Kim RN, BSN Entered By: Gretta Cool, BSN, RN, CWS, Kim on 03/27/2017 09:03:57 [place], [name] ([id number]) -------------------------------------------------------------------------------- Problem List Details Patient Name: Harron, Kristine V. Date of Service: 03/26/2017 10:15 AM Medical Record Patient Account Number: [id number] Number: Treating RN: Cornell Barman [birth year] (76 y.o. 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 with unspecified 11/06/2016 Yes severity E11.52 Type 2 diabetes mellitus 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 I70.235 Atherosclerosis of native arteries of right leg with 11/06/2016 Yes ulceration of other part of foot Inactive Problems Resolved Problems Electronic Signature(s) ISABELLAMARIE, RANDA ([id number]) Signed: 03/27/2017 9:18:28 AM By: Gretta Cool, BSN, RN, CWS, Kim RN, BSN Signed: 03/30/2017 12:13:36 PM By: Linton Ham MD Previous Signature: 03/26/2017 5:02:24 PM Version By: Linton Ham MD Entered By: Gretta Cool, BSN, RN, CWS, Kim on 03/27/2017 09:04:28 [place], Lovena [name] ([id number]) -------------------------------------------------------------------------------- Progress Note Details Patient Name: Plouffe, Pearley V. Date of Service: 03/26/2017 10:15 AM Medical Record Patient Account Number: [id number]  Number: Treating RN: Cornell Barman Feb 28, 1941 (76 y.o. Other Clinician: Date of Birth/Sex: Female) Treating ROBSON, [name] Primary Care Provider: Beverlyn Roux Provider/Extender: G Referring Provider: Dorthula Nettles in Treatment: 20 Subjective History of Present Illness (HPI) 08/13/16: This is a 76 year old woman who came predominantly for review of 3 cm in diameter circular wound to the left anterior lateral leg. She was in the ER on 08/01/16 I reviewed their notes. There was apparently pus coming out of the wound at that time and the patient arrived requesting debridement which they don't do in the emergency room. Nevertheless I can't see that they did any x-rays. There  were no cultures done. She is a type II diabetic and I a note after the patient was in the clinic that she had a bypass graft from the popliteal to the tibial on the right on 02/28/16. She also had a right greater saphenous vein harvest on the same date for arterial bypass. She is going to have vascular studies including ABIs T ABIs on the right on 08/28/16. The patient's surgery was on 02/28/16 by Dr. Vallarie Mare she had a right below the knee popliteal artery to peroneal artery bypass with reverse greater saphenous vein and an endarterectomy of the mid segment peroneal artery. Postoperatively she had a strong mild monophasic peroneal signal with a pink foot. It would appear that the patient is had some nonhealing in the surgical saphenous vein harvest site on the left leg. Surprisingly looking [facility] I cannot see much information about this at all. Dr. Lucious Groves notes from 05/29/16 show that the patient's wounds "are not healed" the right first metatarsal wound healed but then opened back up. 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 At that point the patient had a wound VAC to her right calf with regards to the wounds on her dorsal right toe would appear that these are felt to be arterial wounds. She has had surgery on the metatarsal phalangeal in 2015 I Dr. Doran Durand secondary to a right metatarsal phalangeal joint fracture. She is apparently had discoloration around this area since then. 08/28/16; patient arrives with her wounds in much the same condition. The linear vein harvest site and the circular wound below it which I think was a blister. She also has to probing holes in her right great toe and a necrotic eschar on the right second toe. Because of these being arterial wounds I reduced her compression from 3-2 layers this seems to of done satisfactorily she has not had any problems. I  cannot see that she is actually had an x-ray ====== 11/06/16 the patient comes in for evaluation of her right lower extremity ulcers. She was here in January 2018 for 2 visits subsequently ended up in the hospital with pneumonia and then to rehabilitation. She has now been discharged from rehabilitation and is home. She has multiple ulcerations to her right lower extremity including the foot and toes. She does have home health in place and they have been placing alginate to the ulcers. She is followed by Dr. Bridgett Larsson of vascular medicine. 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 She recently saw him on 3/23. In office ABIs were: Rautio, Catalaya V. ([id number]) Right 0.48 with monophasic flow to the DP, PT, peroneal Left 0.63 with monophasic flow to the DP and PT Her arterial studies  indicated a patent right below knee popliteal to peroneal bypass She had an MRI in February 2008 that was negative frosty myelitis but this showed general soft tissue edema in the right foot and lower extremity concerning for cellulitis She is a diabetic, managed with insulin. Her hemoglobin A1c in December 2017 was 8.4 which is a trend up from previous levels. She had blood work in February 2018 which revealed an albumin of 2.6 this appears to be relatively acute as an albumin in November 2017 was 3.7 11/13/16; this is a patient I have not seen since February who is readmitted to our clinic last week. 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 Bridgett Larsson. I have reviewed Dr. Lianne Moris notes from March/23/18. Doppler ABI on that date showed an ABI on the right of 0.48 and on the left of 0.63. Dorsalis pedis waveforms were monophasic bilaterally. There was no waveforms detected at the posterior tibial on the right, monophasic on the left. Dr. Lianne Moris comments were that this patient would have  follow-up vascular studies in 3 months including ABIs and right lower extremity arterial duplex. She had an MRI in February that was negative for osteomyelitis but showed generalized edema in the foot. Last albumin I see was in January at 3.4 we have been using Santyl to the 4 wounds on the right leg the patient is noted today to have widespread edema well up towards her groin this is pitting 2-3+. 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 Normal ejection fraction. 11/20/16; patient has a follow-up appointment with Dr. Bridgett Larsson on April 23. She is still complaining of a lot of pain in the right foot and right leg. It is not clear to me that this is at all positional however I think it is clear claudication with minimal activity perhaps at rest. At our suggestion she is return to her primary physician's office tomorrow with regards to her pitting lower extremity bilateral edema that I reviewed in detail last week 11/27/16; the follow-up with Dr. Bridgett Larsson was actually on May 23 on April 23 as I stated in my note last week. N/A case all of her wounds seems somewhat smaller. The 2 on the right leg are definitely smaller. The areas on the dorsal right first toe, right third toe and the lateral part of the right fifth metatarsal head all looks smaller but have tightly adherent surfaces. We have been using Santyl 12/04/16; follow-up with Dr. Bridgett Larsson on May 23. 2 small open wounds on the right leg continue to get smaller. The area on the right third total lateral aspect of the right fifth toe also look better. The remaining area on the dorsal first toe still has some depth to it. We have been using Santyl to the toes and collagen on the right 12/11/16; according to patient's husband the follow-up with Dr. Bridgett Larsson is not until July. All of the wounds on the right leg are measuring smaller. We have been using some combination of Prisma and Santyl although I think we  can go to straight Prisma today. There may have been some confusion with home health about the primary dressing orders here. 12/25/16; the patient has had some healing this week. The area on her right lateral fifth metatarsal head, right third toe are both healed lower right leg is healed. In the vein harvest site superiorly she has one superficial open area.  On the dorsal aspect of her right great toe/MTP joint the wound is now divided into 2 however the proximal area is deep and there is palpable bone 01/01/17; still an open area in the middle of her original right surgical scar. The area on the right third toe and right lateral fifth metatarsal head remained closed. Problematic area on the dorsal aspect of the toe. Previous surgery in this area line 01/08/17 small open area on the original scar on her upper anterior leg although this is closing. X-ray I did of the right first toe did not show underlying bony abnormality. Still this area on the dorsal first toe probes to bone. We have been using Prisma 01/15/17; small open area in the original scar in the upper anterior leg is almost fully closed. She has 2 open areas over the dorsal aspect of the right first toe that probes to bone. Used and a form starting last week. Vigorous bone scraping that I did last week showed few methicillin sensitive staph aureus. She is allergic to Tadlock, Malee V. ([id number]) penicillin and sulfa drugs. I'm going to give her 2 weeks of doxycycline. She will need an MRI with contrast. 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 She continues on doxycycline for a bone scraping that showed methicillin sensitive staph aureus [allergic to penicillin and sulfa]. We have been using Endo form to the wound. 01/29/17; surprisingly her MRI did not show osteomyelitis. She continues on doxycycline for a bone scraping that showed methicillin sensitive staph  aureus with allergies to penicillin and sulfa. We have been using Endo form to the wound. Unfortunately I cannot get a surface on this visit looks like it is able to support a healing state. Proximally there is still exposed bone. There is no overt soft tissue tenderness. Her MRI did show a previous fracture was surgery to this area but no hardware 02/12/17 on evaluation today patient appears to be doing well in regard to her lower extremity wound. She does have some mild discomfort but this is minimal. There's no evidence of infection. Her left great toe nail has somewhat been lifted up and there is a little bit of slight bleeding underneath but this is still firmly attached. 02/19/17; she ran out of doxycycline 2 days ago. Nevertheless I would like to continue this for 2 weeks to make a full 6 weeks of therapy as the bone scraping that I did from the open area on the dorsal right first toe showed MRSA. This should complete antibiotic therapy. 02/26/17; the patient is completing her 6 weeks of oral doxycycline. Deep wound on the dorsal right toe. This still has some exposed bone proximally. She does have a reasonably granulated surface albeit thin over bone. I've been using Endo form have made application for an amniotic skin sub 03/05/17; the patient has completed 6 weeks of doxycycline. This is a deep wound on the dorsal right toe which has exposed bone proximally. She has granulation over most of this wound albeit a thin layer. I've been using Endo form. Still do not have approval for Affinity 03/13/17 on evaluation today patient's right foot wound appears to be doing better measurement wise compared to her last evaluation. She has been tolerating the dressing change without complication and does have a appointment with the dietary that has been made as far as referral is concerned there just waiting for her and transportation  to contact them back for an actual date. Nonetheless patient has  been having less pain at this point. No fevers, chills, nausea, or vomiting noted at this time. 03/26/17; linear wound over the dorsal aspect of her right great toe. At one point this had exposed bone on the most superior aspect however I am pleased to see today that this appears to have a surface of granulation. Apparently product was applied for but denied by [facility]. We'll need to see what that was. The patient has known PAD status post revascularization. MRI did not show osteomyelitis which was done in June Objective Constitutional Sitting or standing Blood Pressure is within target range for patient.. Pulse regular and within target range for patient.[name] Respirations regular, non-labored and within target range.. Temperature is normal and within the target range for the patient.[name] appears in no distress. Vitals Time Taken: 10:33 AM, Height: 64 in, Weight: 200 lbs, BMI: 34.3, Temperature: 98.1 F, Pulse: 91 Harbaugh, Tommy V. ([id number]) bpm, Respiratory Rate: 16 breaths/min, Blood Pressure: 142/55 mmHg. Eyes Conjunctivae clear. No discharge. Respiratory Respiratory effort is easy and symmetric bilaterally. Rate is normal at rest and on room air.. Bilateral breath sounds are clear and equal in all lobes with no wheezes, rales or rhonchi.. Cardiovascular Jugular venous pressure is borderline 2 to 3/6 midsystolic murmur. Pedal pulses faintly palpable at the dorsalis pedis. Edema present in both extremities. This is tense and nonpitting likely lymphedema. Lymphatic None palpable in the popliteal or inguinal area. Psychiatric No evidence of depression, anxiety, or agitation. Calm, cooperative, and communicative. Appropriate interactions and affect.. General Notes: Wound exam; I was really pleased to see how this patient is doing today. She does not have palpable or obvious visual exposed bone. This is a big improvement since I saw this 3 weeks ago. We have been using Endoform. There is no  evidence of surrounding infection. Integumentary (Hair, Skin) Wound #7 status is Open. Original cause of wound was Gradually Appeared. The wound is located on the Right,Dorsal Metatarsal head first. The wound measures 2cm length x 0.6cm width x 0.4cm depth; 0.942cm^2 area and 0.377cm^3 volume. There is muscle and Fat Layer (Subcutaneous Tissue) Exposed exposed. There is no tunneling or undermining noted. There is a large amount of serous drainage noted. The wound margin is flat and intact. There is small (1-33%) pink, friable granulation within the wound bed. There is a medium (34-66%) amount of necrotic tissue within the wound bed including Adherent Slough. The periwound skin appearance exhibited: Induration, Scarring, Ecchymosis, Hemosiderin Staining, Mottled. The periwound skin appearance did not exhibit: Maceration. Periwound temperature was noted as No Abnormality. The periwound has tenderness on palpation. 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 Cleanse wound with mild soap and water Anesthetic: Wound #7 Right,Dorsal Metatarsal head first: Topical Lidocaine 4% cream applied to wound bed prior to debridement Primary Wound Dressing: Wound #7 Right,Dorsal Metatarsal head first: Other: - ENDOFORM Secondary  Dressing: Wound #7  Right,Dorsal Metatarsal head first: Conform/Kerlix Foam Dressing Change Frequency: Wound #7 Right,Dorsal Metatarsal head first: Change Dressing Monday, Wednesday, Friday Follow-up Appointments: Wound #7 Right,Dorsal Metatarsal head first: Return Appointment in 1 week. Edema Control: Wound #7 Right,Dorsal Metatarsal head first: Elevate legs to the level of the heart and pump ankles as often as possible Additional Orders / Instructions: Wound #7 Right,Dorsal Metatarsal head first: Increase protein intake. Activity as tolerated Home Health: Wound #7 Right,Dorsal Metatarsal head first: [facility] - [facility] twice weekly, [facility] Home Health Nurse may visit [facility] to address patient s wound care needs. FACE TO FACE ENCOUNTER: MEDICARE and MEDICAID PATIENTS: I certify that this patient is under my care and that I had a face-to-face encounter that meets the physician face-to-face encounter requirements with this patient on this date. The encounter with the patient was in whole or in part for the following MEDICAL CONDITION: (primary reason for [place]) MEDICAL NECESSITY: I certify, that based on my findings, NURSING services are a medically necessary home health service. HOME BOUND STATUS: I certify that my clinical findings support that this patient is homebound (i.e., Due to illness or injury, pt requires aid of supportive devices such as crutches, cane, wheelchairs, walkers, the use of special transportation or the assistance of another person to leave their place of residence. There is a normal inability to leave the home and doing so requires considerable and taxing effort. Other absences are Fogal, Vollie V. ([id number]) for medical reasons / religious services and are infrequent or of short duration when for other reasons). If current dressing causes regression in wound condition, may D/C ordered dressing product/s and apply Normal Saline Moist  Dressing daily until next [place] / Other MD appointment. [place] of regression in wound condition at [phone]. Please direct any NON-WOUND related issues/requests for orders to patient's Primary Care Physician #1 continue Endoform #2 unfortunately the patient has a considerable co-pay for Oasis which would be my next choice here. #3 she has lower extremity edema but no clear evidence at the bedside of systemic fluid overload. She is on diuretics Electronic Signature(s) Signed: 03/27/2017 9:18:28 AM By: Gretta Cool, BSN, RN, CWS, Kim RN, BSN Signed: 03/30/2017 12:13:36 PM By: Linton Ham MD Previous Signature: 03/26/2017 5:02:24 PM Version By: Linton Ham MD Entered By: Gretta Cool, BSN, RN, CWS, Kim on 03/27/2017 09:05:10 Montini, [name] ([id number]) -------------------------------------------------------------------------------- SuperBill Details Patient Name: [place], Janah V. Date of Service: 03/26/2017 Medical Record Patient Account Number: [id number] Number: Treating RN: Cornell Barman 1941/07/18 (76 y.o. 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 I70.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 of foot Facility Procedures CPT4 Code: [id number] Description: 50932 - WOUND CARE VISIT-LEV 3 EST  PT Modifier: Quantity: 1 Physician Procedures CPT4: Description Modifier Quantity Code [id number] - WC PHYS LEVEL 3 - EST PT 1 ICD-10 Description Diagnosis E11.621 Type 2 diabetes mellitus with foot ulcer I70.235 Atherosclerosis  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 of other part of  foot Electronic Signature(s) Signed: 03/27/2017 9:18:28 AM By: Gretta Cool, BSN, RN, CWS, Kim RN, BSN Signed: 03/30/2017 12:13:36 PM By: Linton Ham MD Previous Signature: 03/26/2017 5:02:24 PM Version By: Linton Ham MD Entered By: Gretta Cool, BSN, RN, CWS, Kim on 03/27/2017 09:04:19

## 2017-04-02 ENCOUNTER — Encounter: Payer: Medicare HMO | Admitting: Internal Medicine

## 2017-04-02 DIAGNOSIS — E11622 Type 2 diabetes mellitus with other skin ulcer: Secondary | ICD-10-CM | POA: Diagnosis not present

## 2017-04-04 NOTE — Progress Notes (Signed)
LAINY, WROBLESKI ([id number]) Visit Report for 04/02/2017 Arrival Information Details Patient Name: [name] [name] GRABEL. Date of Service: 04/02/2017 10:15 AM Medical Record Number: [id number] Patient Account Number: [id number] Date of Birth/Sex: 11-Aug-1940 (76 y.o. Female) Treating RN: Cornell Barman Primary Care Carnell Beavers: Beverlyn Roux Other Clinician: Referring Raymon Schlarb: Beverlyn Roux Treating Relda Agosto/Extender: Tito Dine in Treatment: 21 Visit Information History Since Last Visit Added or deleted any medications: No Patient Arrived: Wheel Chair Any new allergies or adverse reactions: No Arrival Time: 10:18 Had a fall or experienced change in No Accompanied By: husband activities of daily living that may affect [name] risk of falls: Transfer Assistance: Manual Signs or symptoms of abuse/neglect since last No Patient Identification Verified: Yes visito Secondary Verification Process Yes Hospitalized since last visit: No Completed: Has Dressing in Place as Prescribed: Yes Patient Requires Transmission- No Pain Present Now: No Based Precautions: Patient Has Alerts: No Electronic Signature(s) Signed: 04/02/2017 5:49:32 PM By: Gretta Cool, BSN, RN, CWS, Kim RN, BSN Entered By: Gretta Cool, BSN, RN, CWS, Kim on 04/02/2017 10:19:26 [name] [name] ([id number]) -------------------------------------------------------------------------------- Encounter Discharge Information Details Patient Name: [name] [name] V. Date of Service: 04/02/2017 10:15 AM Medical Record Number: [id number] Patient Account Number: [id number] Date of Birth/Sex: 1941/06/08 (76 y.o. Female) Treating RN: Cornell Barman Primary Care Cris Gibby: Beverlyn Roux Other Clinician: Referring Jahrel Borthwick: Beverlyn Roux Treating Sundy Houchins/Extender: Tito Dine in Treatment: 21 Encounter Discharge Information Items Discharge Pain Level: 0 Discharge Condition: Stable Ambulatory Status: Wheelchair Discharge Destination:  Home Transportation: Private Auto Accompanied By: husband Schedule Follow-up Appointment: Yes Medication Reconciliation completed and provided to Patient/Care Yes Deegan Valentino: Provided on Clinical Summary of Care: 04/02/2017 Form Type Recipient Paper Patient [facility] Electronic Signature(s) Signed: 04/02/2017 5:49:32 PM By: Gretta Cool, BSN, RN, CWS, Kim RN, BSN Entered By: Gretta Cool, BSN, RN, CWS, Kim on 04/02/2017 10:51:12 [name] [name] ([id number]) -------------------------------------------------------------------------------- Lower Extremity Assessment Details Patient Name: [name] [name] V. Date of Service: 04/02/2017 10:15 AM Medical Record Number: [id number] Patient Account Number: [id number] Date of Birth/Sex: October 27, 1940 (76 y.o. Female) Treating RN: Cornell Barman Primary Care Basya Casavant: Beverlyn Roux Other Clinician: Referring Othella Slappey: Beverlyn Roux Treating Myleka Moncure/Extender: Tito Dine in Treatment: 21 Vascular Assessment Pulses: Dorsalis Pedis Palpable: [Right:No] Doppler Audible: [Right:Yes] Posterior Tibial Palpable: [Right:No] Doppler Audible: [Right:Inaudible] Extremity colors, hair growth, and conditions: Extremity Color: [Right:Mottled] Hair Growth on Extremity: [Right:No] Temperature of Extremity: [Right:Warm] Capillary Refill: [Right:> 3 seconds] Dependent Rubor: [Right:No] Blanched when Elevated: [Right:No] Lipodermatosclerosis: [Right:No] Toe Nail Assessment Left: Right: Thick: Yes Discolored: Yes Deformed: Yes Improper Length and Hygiene: Yes Electronic Signature(s) Signed: 04/02/2017 5:49:32 PM By: Gretta Cool, BSN, RN, CWS, Kim RN, BSN Entered By: Gretta Cool, BSN, RN, CWS, Kim on 04/02/2017 10:31:31 [name] [name] ([id number]) -------------------------------------------------------------------------------- Multi Wound Chart Details Patient Name: [name] [name] V. Date of Service: 04/02/2017 10:15 AM Medical Record Number: [id number] Patient Account Number:  [id number] Date of Birth/Sex: 07/19/1941 (76 y.o. Female) Treating RN: Cornell Barman Primary Care Kyshawn Teal: Beverlyn Roux Other Clinician: Referring Aniyiah Zell: Beverlyn Roux Treating Auden Tatar/Extender: Tito Dine in Treatment: 21 Vital Signs Height(in): 64 Pulse(bpm): 67 Weight(lbs): 200 Blood Pressure 143/61 (mmHg): Body Mass Index(BMI): 34 Temperature(F): 97.6 Respiratory Rate 16 (breaths/min): Photos: [N/A:N/A] Wound Location: Right Metatarsal head first N/A N/A - Dorsal Wounding Event: Gradually Appeared N/A N/A Primary Etiology: Diabetic Wound/Ulcer of N/A N/A the Lower Extremity Comorbid History: Cataracts, Chronic sinus N/A N/A problems/congestion, Congestive Heart Failure, Hypertension, Type II Diabetes Date Acquired: 08/06/2013 N/A N/A Weeks of Treatment: 21 N/A N/A Wound Status: Open  N/A N/A Measurements L x W x D 1.8x0.5x0.4 N/A N/A (cm) Area (cm) : 0.707 N/A N/A Volume (cm) : 0.283 N/A N/A % Reduction in Area: 40.00% N/A N/A % Reduction in Volume: 19.80% N/A N/A Classification: Grade 2 N/A N/A Exudate Amount: Large N/A N/A Exudate Type: Serous N/A N/A Exudate Color: amber N/A N/A Wound Margin: Flat and Intact N/A N/A [name], [name] ([id number]) Granulation Amount: Small (1-33%) N/A N/A Granulation Quality: Pink, Friable N/A N/A Necrotic Amount: Medium (34-66%) N/A N/A Exposed Structures: Fat Layer (Subcutaneous N/A N/A Tissue) Exposed: Yes Muscle: Yes Fascia: No Tendon: No Joint: No Bone: No Epithelialization: Small (1-33%) N/A N/A Periwound Skin Texture: Induration: Yes N/A N/A Scarring: Yes Periwound Skin Maceration: No N/A N/A Moisture: Periwound Skin Color: Ecchymosis: Yes N/A N/A Hemosiderin Staining: Yes Mottled: Yes Temperature: No Abnormality N/A N/A Tenderness on Yes N/A N/A Palpation: Wound Preparation: Ulcer Cleansing: N/A N/A Rinsed/Irrigated with Saline, Other: soap and water Topical Anesthetic Applied: Other:  lidociane 4% Treatment Notes Electronic Signature(s) Signed: 04/02/2017 5:01:41 PM By: Linton Ham MD Entered By: Linton Ham on 04/02/2017 10:44:58 Sturgess, Kimyetta [name] ([id number]) -------------------------------------------------------------------------------- Multi-Disciplinary Care Plan Details Patient Name: [name] [name] V. Date of Service: 04/02/2017 10:15 AM Medical Record Number: [id number] Patient Account Number: [id number] Date of Birth/Sex: Feb 12, 1941 (76 y.o. Female) Treating RN: Cornell Barman Primary Care Yasiel Goyne: Beverlyn Roux Other Clinician: Referring Makinsey Pepitone: Beverlyn Roux Treating Britton Bera/Extender: Tito Dine in Treatment: 21 Active Inactive ` Abuse / Safety / Falls / Self Care Management Nursing Diagnoses: Impaired physical mobility Potential for falls Goals: Patient will remain injury free Date Initiated: 11/06/2016 Target Resolution Date: 12/30/2016 Goal Status: Active Patient/caregiver will verbalize understanding of skin care regimen Date Initiated: 11/06/2016 Target Resolution Date: 12/30/2016 Goal Status: Active Interventions: Assess fall risk on admission and as needed Treatment Activities: Patient referred to home care : 11/06/2016 Notes: ` Nutrition Nursing Diagnoses: Potential for alteratiion in Nutrition/Potential for imbalanced nutrition Goals: Patient/caregiver verbalizes understanding of need to maintain therapeutic glucose control per primary care physician Date Initiated: 11/06/2016 Target Resolution Date: 12/30/2016 Goal Status: Active Interventions: Provide education on elevated blood sugars and impact on wound healing Leatherbury, Leora V. ([id number]) Notes: ` Orientation to the Wound Care Program Nursing Diagnoses: Knowledge deficit related to the wound healing center program Goals: Patient/caregiver will verbalize understanding of the [place] Date Initiated: 11/06/2016 Target Resolution Date: 12/30/2016 Goal  Status: Active Interventions: Provide education on orientation to the wound center Notes: ` Venous Leg Ulcer Nursing Diagnoses: Actual venous Insuffiency (use after diagnosis is confirmed) Knowledge deficit related to disease process and management Goals: Non-invasive venous studies are completed as ordered Date Initiated: 11/06/2016 Target Resolution Date: 12/30/2016 Goal Status: Active Patient/caregiver will verbalize understanding of disease process and disease management Date Initiated: 11/06/2016 Target Resolution Date: 12/30/2016 Goal Status: Active Interventions: Assess peripheral edema status every visit. Notes: ` Wound/Skin Impairment Nursing Diagnoses: Impaired tissue integrity Knowledge deficit related to smoking impact on wound healing Knowledge deficit related to ulceration/compromised skin integrity Goals: [name] [name] V. ([id number]) Ulcer/skin breakdown will heal within 14 weeks Date Initiated: 11/06/2016 Target Resolution Date: 02/05/2017 Goal Status: Active Interventions: Assess ulceration(s) every visit Treatment Activities: Skin care regimen initiated : 11/06/2016 Notes: Electronic Signature(s) Signed: 04/02/2017 5:49:32 PM By: Gretta Cool, BSN, RN, CWS, Kim RN, BSN Entered By: Gretta Cool, BSN, RN, CWS, Kim on 04/02/2017 10:33:51 Dancy, [name] [name] ([id number]) -------------------------------------------------------------------------------- Pain Assessment Details Patient Name: [name] [name] V. Date of Service: 04/02/2017 10:15 AM Medical Record Number:  [id number] Patient Account Number: [id number] Date of Birth/Sex: January 28, 1941 (76 y.o. Female) Treating RN: Cornell Barman Primary Care Quincey Nored: Beverlyn Roux Other Clinician: Referring Donell Tomkins: Beverlyn Roux Treating Johannah Rozas/Extender: Tito Dine in Treatment: 21 Active Problems Location of Pain Severity and Description of Pain Patient Has Paino No Site Locations With Dressing Change: No Pain Management and  Medication Current Pain Management: Goals for Pain Management Topical or injectable lidocaine is offered to patient for acute pain when surgical debridement is performed. If needed, Patient is instructed to use over the counter pain medication for the following 24-48 hours after debridement. Wound care MDs do not prescribed pain medications. Patient has chronic pain or uncontrolled pain. Patient has been instructed to make an appointment with their Primary Care Physician for pain management. Electronic Signature(s) Signed: 04/02/2017 5:49:32 PM By: Gretta Cool, BSN, RN, CWS, Kim RN, BSN Entered By: Gretta Cool, BSN, RN, CWS, Kim on 04/02/2017 10:19:51 Hemsley, [name] [name] ([id number]) -------------------------------------------------------------------------------- Patient/Caregiver Education Details Patient Name: [name] [name] V. Date of Service: 04/02/2017 10:15 AM Medical Record Patient Account Number: [id number] Number: Treating RN: Cornell Barman 09/14/1940 (76 y.o. Other Clinician: Date of Birth/Gender: Female) Treating ROBSON, [name] Primary Care Physician: Beverlyn Roux Physician/Extender: G Referring Physician: Dorthula Nettles in Treatment: 21 Education Assessment Education Provided To: Patient Education Topics Provided Wound/Skin Impairment: Handouts: Caring for Your Ulcer, Other: continue wound care as prescribed Methods: Demonstration Responses: State content correctly Electronic Signature(s) Signed: 04/02/2017 5:49:32 PM By: Gretta Cool, BSN, RN, CWS, Kim RN, BSN Entered By: Gretta Cool, BSN, RN, CWS, Kim on 04/02/2017 10:51:43 Duffus, [name] [name] ([id number]) -------------------------------------------------------------------------------- Wound Assessment Details Patient Name: [name] [name] V. Date of Service: 04/02/2017 10:15 AM Medical Record Number: [id number] Patient Account Number: [id number] Date of Birth/Sex: 08-19-1940 (76 y.o. Female) Treating RN: Cornell Barman Primary Care Fard Borunda:  Beverlyn Roux Other Clinician: Referring Xan Sparkman: Beverlyn Roux Treating Cordera Stineman/Extender: Tito Dine in Treatment: 21 Wound Status Wound Number: 7 Primary Diabetic Wound/Ulcer of the Lower Etiology: Extremity Wound Location: Right Metatarsal head first - Dorsal Wound Open Status: Wounding Event: Gradually Appeared Comorbid Cataracts, Chronic sinus Date Acquired: 08/06/2013 History: problems/congestion, Congestive Heart Weeks Of Treatment: 21 Failure, Hypertension, Type II Diabetes Clustered Wound: No Photos Wound Measurements Length: (cm) 1.8 Width: (cm) 0.5 Depth: (cm) 0.4 Area: (cm) 0.707 Volume: (cm) 0.283 % Reduction in Area: 40% % Reduction in Volume: 19.8% Epithelialization: Small (1-33%) Tunneling: No Undermining: No Wound Description Classification: Grade 2 Wound Margin: Flat and Intact Exudate Amount: Large Exudate Type: Serous Exudate Color: amber Foul Odor After Cleansing: No Slough/Fibrino Yes Wound Bed Granulation Amount: Small (1-33%) Exposed Structure Granulation Quality: Pink, Friable Fascia Exposed: No Necrotic Amount: Medium (34-66%) Fat Layer (Subcutaneous Tissue) Exposed: Yes Necrotic Quality: Adherent Slough Tendon Exposed: No Muscle Exposed: Yes Necrosis of Muscle: No Joint Exposed: No [name] [name] V. ([id number]) Bone Exposed: No Periwound Skin Texture Texture Color No Abnormalities Noted: No No Abnormalities Noted: No Induration: Yes Ecchymosis: Yes Scarring: Yes Hemosiderin Staining: Yes Mottled: Yes Moisture No Abnormalities Noted: No Temperature / Pain Maceration: No Temperature: No Abnormality Tenderness on Palpation: Yes Wound Preparation Ulcer Cleansing: Rinsed/Irrigated with Saline, Other: soap and water, Topical Anesthetic Applied: Other: lidociane 4%, Treatment Notes Wound #7 (Right, Dorsal Metatarsal head first) 1. Cleansed with: Clean wound with Normal Saline 2. Anesthetic Topical Lidocaine 4%  cream to wound bed prior to debridement 4. Dressing Applied: Other dressing (specify in notes) 5. Secondary Dressing Applied Foam Kerlix/Conform 7. Secured with Tape Notes endoform Electronic Signature(s) Signed: 04/02/2017  5:49:32 PM By: Gretta Cool, BSN, RN, CWS, Kim RN, BSN Entered By: Gretta Cool, BSN, RN, CWS, Kim on 04/02/2017 10:30:32 Broadus, [name] [name] ([id number]) -------------------------------------------------------------------------------- Vitals Details Patient Name: [name] [name] V. Date of Service: 04/02/2017 10:15 AM Medical Record Number: [id number] Patient Account Number: [id number] Date of Birth/Sex: 10-16-1940 (76 y.o. Female) Treating RN: Cornell Barman Primary Care Ronita Hargreaves: Beverlyn Roux Other Clinician: Referring Zamarion Longest: Beverlyn Roux Treating Felicia Both/Extender: Tito Dine in Treatment: 21 Vital Signs Time Taken: 10:19 Temperature (F): 97.6 Height (in): 64 Pulse (bpm): 67 Weight (lbs): 200 Respiratory Rate (breaths/min): 16 Body Mass Index (BMI): 34.3 Blood Pressure (mmHg): 143/61 Reference Range: 80 - 120 mg / dl Electronic Signature(s) Signed: 04/02/2017 5:49:32 PM By: Gretta Cool, BSN, RN, CWS, Kim RN, BSN Entered By: Gretta Cool, BSN, RN, CWS, Kim on 04/02/2017 10:20:12

## 2017-04-04 NOTE — Progress Notes (Addendum)
[name], [name] ([id number]) Visit Report for 04/02/2017 Debridement Details Patient Name: [name], [name] Date of Service: 04/02/2017 10:15 AM Medical Record Patient Account Number: [id number] Number: Treating RN: Cornell Barman 05/07/1941 (76 y.o. Other Clinician: Date of Birth/Sex: Female) Treating ROBSON, [name] Primary Care Provider: Beverlyn Roux Provider/Extender: G Referring Provider: Dorthula Nettles in Treatment: 21 Debridement Performed for Wound #7 Right,Dorsal Metatarsal head first Assessment: Performed By: Physician Ricard Dillon, MD Debridement: Debridement Severity of Tissue Pre Fat layer exposed Debridement: Pre-procedure Verification/Time Out Yes - 10:38 Taken: Start Time: 10:39 Level: Skin/Subcutaneous Tissue Total Area Debrided (L x 1.8 (cm) x 0.5 (cm) = 0.9 (cm) W): Tissue and other Viable, Fibrin/Slough, Subcutaneous material debrided: Instrument: Curette Bleeding: Minimum Hemostasis Achieved: Pressure End Time: 10:39 Procedural Pain: 0 Post Procedural Pain: 0 Response to Treatment: Procedure was tolerated well Post Debridement Measurements of Total Wound Length: (cm) 1.8 Width: (cm) 0.5 Depth: (cm) 0.5 Volume: (cm) 0.353 Character of Wound/Ulcer Post Stable Debridement: Severity of Tissue Post Debridement: Fat layer exposed Post Procedure Diagnosis Same as Pre-procedure [name] ([id number]) Electronic Signature(s) Signed: 04/02/2017 5:01:41 PM By: Linton Ham MD Signed: 04/02/2017 5:49:32 PM By: Gretta Cool, BSN, RN, CWS, Kim RN, BSN Entered By: Linton Ham on 04/02/2017 10:51:08 [name], [name] ([id number]) -------------------------------------------------------------------------------- HPI Details Patient Name: [name], [name] Date of Service: 04/02/2017 10:15 AM Medical Record Patient Account Number: [id number] Number: Treating RN: Cornell Barman 27-Oct-1940 (76 y.o. Other Clinician: Date of  Birth/Sex: Female) Treating ROBSON, [name] Primary Care Provider: Beverlyn Roux Provider/Extender: G Referring Provider: Dorthula Nettles in Treatment: 21 History of Present Illness HPI Description: 08/13/16: This is a 76 year old woman who came predominantly for review of 3 cm in diameter circular wound to the left anterior lateral leg. She was in the ER on 08/01/16 I reviewed their notes. There was apparently pus coming out of the wound at that time and the patient arrived requesting debridement which they don't do in the emergency room. Nevertheless I can't see that they did any x-rays. There were no cultures done. She is a type II diabetic and I a note after the patient was in the clinic that she had a bypass graft from the popliteal to the tibial on the right on 02/28/16. She also had a right greater saphenous vein harvest on the same date for arterial bypass. She is going to have vascular studies including ABIs T ABIs on the right on 08/28/16. The patient's surgery was on 02/28/16 by Dr. Vallarie Mare she had a right below the knee popliteal artery to peroneal artery bypass with reverse greater saphenous vein and an endarterectomy of the mid segment peroneal artery. Postoperatively she had a strong mild monophasic peroneal signal with a pink foot. It would appear that the patient is had some nonhealing in the surgical saphenous vein harvest site on the left leg. Surprisingly [facility] I cannot see much information about this at all. Dr. Lucious Groves notes from 05/29/16 show that the patient's wounds "are not healed" the right first metatarsal wound healed but then opened back up. 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 At that point the patient had a wound VAC to her right calf with regards to the wounds on her dorsal right toe would appear that these are felt to be arterial  wounds. She has had surgery on the metatarsal phalangeal in 2015 I Dr. Doran Durand secondary  to a right metatarsal phalangeal joint fracture. She is apparently had discoloration around this area since then. 08/28/16; patient arrives with her wounds in much the same condition. The linear vein harvest site and the circular wound below it which I think was a blister. She also has to probing holes in her right great toe and a necrotic eschar on the right second toe. Because of these being arterial wounds I reduced her compression from 3-2 layers this seems to of done satisfactorily she has not had any problems. I cannot see that she is actually had an x-ray ====== 11/06/16 the patient comes in for evaluation of her right lower extremity ulcers. She was here in January 2018 for 2 visits subsequently ended up in the hospital with pneumonia and then to rehabilitation. She has now been discharged from rehabilitation and is home. She has multiple ulcerations to her right lower extremity including the foot and toes. She does have home health in place and they have been placing alginate to the ulcers. She is followed by Dr. Bridgett Larsson of vascular medicine. 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 She recently saw him on 3/23. In office ABIs were: [name] ([id number]) Right 0.48 with monophasic flow to the DP, PT, peroneal Left 0.63 with monophasic flow to the DP and PT Her arterial studies indicated a patent right below knee popliteal to peroneal bypass She had an MRI in February 2008 that was negative frosty myelitis but this showed general soft tissue edema in the right foot and lower extremity concerning for cellulitis She is a diabetic, managed with insulin. Her hemoglobin A1c in December 2017 was 8.4 which is a trend up from previous levels. She had blood work in February 2018 which revealed an albumin of 2.6 this appears to be  relatively acute as an albumin in November 2017 was 3.7 11/13/16; this is a patient I have not seen since February who is readmitted to our clinic last week. 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 Bridgett Larsson. I have reviewed Dr. Lianne Moris notes from March/23/18. Doppler ABI on that date showed an ABI on the right of 0.48 and on the left of 0.63. Dorsalis pedis waveforms were monophasic bilaterally. There was no waveforms detected at the posterior tibial on the right, monophasic on the left. Dr. Lianne Moris comments were that this patient would have follow-up vascular studies in 3 months including ABIs and right lower extremity arterial duplex. She had an MRI in February that was negative for osteomyelitis but showed generalized edema in the foot. Last albumin I see was in January at 3.4 we have been using Santyl to the 4 wounds on the right leg the patient is noted today to have widespread edema well up towards her groin this is pitting 2-3+. 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 Normal ejection fraction. 11/20/16; patient has a follow-up appointment with Dr. Bridgett Larsson on [name] 23. She is still complaining of a lot of pain in the right foot and right leg. It is not clear to me that this is at all positional however I think it is clear claudication with minimal activity perhaps at rest. At our suggestion she is return to her primary physician's office tomorrow with regards to her pitting lower extremity bilateral edema that I reviewed in detail  last week 11/27/16; the follow-up with Dr. Bridgett Larsson was actually on May 23 on [name] 23 as I stated in my note last week. N/A case all of her wounds seems somewhat smaller. The 2 on the right leg are definitely smaller. The areas on the dorsal right first toe, right third toe and the lateral part of the right fifth metatarsal head all looks smaller  but have tightly adherent surfaces. We have been using Santyl 12/04/16; follow-up with Dr. Bridgett Larsson on May 23. 2 small open wounds on the right leg continue to get smaller. The area on the right third total lateral aspect of the right fifth toe also look better. The remaining area on the dorsal first toe still has some depth to it. We have been using Santyl to the toes and collagen on the right 12/11/16; according to patient's husband the follow-up with Dr. Bridgett Larsson is not until July. All of the wounds on the right leg are measuring smaller. We have been using some combination of Prisma and Santyl although I think we can go to straight Prisma today. There may have been some confusion with home health about the primary dressing orders here. 12/25/16; the patient has had some healing this week. The area on her right lateral fifth metatarsal head, right third toe are both healed lower right leg is healed. In the vein harvest site superiorly she has one superficial open area. On the dorsal aspect of her right great toe/MTP joint the wound is now divided into 2 however the proximal area is deep and there is palpable bone 01/01/17; still an open area in the middle of her original right surgical scar. The area on the right third toe and right lateral fifth metatarsal head remained closed. Problematic area on the dorsal aspect of the toe. Previous surgery in this area line 01/08/17 small open area on the original scar on her upper anterior leg although this is closing. X-ray I did of the right first toe did not show underlying bony abnormality. Still this area on the dorsal first toe probes to bone. We have been using Prisma 01/15/17; small open area in the original scar in the upper anterior leg is almost fully closed. She has 2 open areas over the dorsal aspect of the right first toe that probes to bone. Used and a form starting last week. Vigorous bone scraping that I did last week showed few methicillin sensitive  staph aureus. She is allergic to Lampson, Suzzanne V. ([id number]) penicillin and sulfa drugs. I'm going to give her 2 weeks of doxycycline. She will need an MRI with contrast. 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 She continues on doxycycline for a bone scraping that showed methicillin sensitive staph aureus [allergic to penicillin and sulfa]. We have been using Endo form to the wound. 01/29/17; surprisingly her MRI did not show osteomyelitis. She continues on doxycycline for a bone scraping that showed methicillin sensitive staph aureus with allergies to penicillin and sulfa. We have been using Endo form to the wound. Unfortunately I cannot get a surface on this visit looks like it is able to support a healing state. Proximally there is still exposed bone. There is no overt soft tissue tenderness. Her MRI did show a previous fracture was surgery to this area but no hardware 02/12/17 on evaluation today patient appears to be doing well in regard to her lower extremity wound. She  does have some mild discomfort but this is minimal. There's no evidence of infection. Her left great toe nail has somewhat been lifted up and there is a little bit of slight bleeding underneath but this is still firmly attached. 02/19/17; she ran out of doxycycline 2 days ago. Nevertheless I would like to continue this for 2 weeks to make a full 6 weeks of therapy as the bone scraping that I did from the open area on the dorsal right first toe showed MRSA. This should complete antibiotic therapy. 02/26/17; the patient is completing her 6 weeks of oral doxycycline. Deep wound on the dorsal right toe. This still has some exposed bone proximally. She does have a reasonably granulated surface albeit thin over bone. I've been using Endo form have made application for an amniotic skin sub 03/05/17; the patient has completed 6 weeks of doxycycline. This is  a deep wound on the dorsal right toe which has exposed bone proximally. She has granulation over most of this wound albeit a thin layer. I've been using Endo form. Still do not have approval for [employer] 03/13/17 on evaluation today patient's right foot wound appears to be doing better measurement wise compared to her last evaluation. She has been tolerating the dressing change without complication and does have a appointment with the dietary that has been made as far as referral is concerned there just waiting for her and transportation to contact them back for an actual date. Nonetheless patient has been having less pain at this point. No fevers, chills, nausea, or vomiting noted at this time. 03/26/17; linear wound over the dorsal aspect of her right great toe. At one point this had exposed bone on the most superior aspect however I am pleased to see today that this appears to have a surface of granulation. Apparently product was applied for but denied by [facility]. We'll need to see what that was. The patient has known PAD status post revascularization. MRI did not show osteomyelitis which was done in June 04/02/17; continued improvement using Endoform. She was approved for [employer] but hasn't over $790 co-pay per application, this is beyond her means Electronic Signature(s) Signed: 04/02/2017 5:01:41 PM By: Linton Ham MD Entered By: Linton Ham on 04/02/2017 10:45:51 Gilliand, [name] ([id number]) -------------------------------------------------------------------------------- Physical Exam Details Patient Name: Valdes, Seerat V. Date of Service: 04/02/2017 10:15 AM Medical Record Patient Account Number: [id number] Number: Treating RN: Cornell Barman 12-20-1940 (76 y.o. Other Clinician: Date of Birth/Sex: Female) Treating ROBSON, [name] Primary Care Provider: Beverlyn Roux Provider/Extender: G Referring Provider: Dorthula Nettles in Treatment: 21 Constitutional Patient is  hypertensive.. Pulse regular and within target range for patient.[name] Respirations regular, non-labored and within target range.. Temperature is normal and within the target range for the patient.[name] appears in no distress. Notes 04/02/17; using a #3 curet I remove necrotic debris from the surface of the wound. Post-debridement healthy granulation and a remarkable improvement. There is no exposed bone. No evidence of surrounding slight cellulitis or infection. Hemostasis with direct pressure oThe proximal part of the wound which was where bone was exposed no longer has any evidence of bone exposure. Electronic Signature(s) Signed: 04/02/2017 5:01:41 PM By: Linton Ham MD Entered By: Linton Ham on 04/02/2017 10:47:12 Pepper, [name] ([id number]) -------------------------------------------------------------------------------- Physician Orders Details Patient Name: [name], [name] Date of Service: 04/02/2017 10:15 AM Medical Record Patient Account Number: [id number] Number: Treating RN: Cornell Barman 04-Sep-1940 (76 y.o. Other Clinician: Date of Birth/Sex: Female) Treating ROBSON, Joanna Primary Care Provider:  Beverlyn Roux Provider/Extender: G Referring Provider: Dorthula Nettles in Treatment: 73 Verbal / Phone Orders: No Diagnosis Coding Wound Cleansing Wound #7 Right,Dorsal Metatarsal head first o Clean wound with Normal Saline. o Cleanse wound with mild soap and water Anesthetic Wound #7 Right,Dorsal Metatarsal head first o Topical Lidocaine 4% cream applied to wound bed prior to debridement Primary Wound Dressing Wound #7 Right,Dorsal Metatarsal head first o Other: - ENDOFORM Secondary Dressing Wound #7 Right,Dorsal Metatarsal head first o Conform/Kerlix o Foam Dressing Change Frequency Wound #7 Right,Dorsal Metatarsal head first o Change Dressing Monday, Wednesday, Friday Follow-up Appointments Wound #7 Right,Dorsal Metatarsal head first o  Return Appointment in 1 week. Edema Control Wound #7 Right,Dorsal Metatarsal head first o Elevate legs to the level of the heart and pump ankles as often as possible Additional Orders / Instructions Wound #7 Right,Dorsal Metatarsal head first o Increase protein intake. Livecchi, BELKYS HENAULT ([id number]) o Activity as tolerated Home Health Wound #7 Right,Dorsal Metatarsal head first o [place] [facility] - [facility] twice weekly, [facility]-Wednesday o Home Health Nurse may visit PRN to address patientos wound care needs. o FACE TO FACE ENCOUNTER: MEDICARE and MEDICAID PATIENTS: I certify that this patient is under my care and that I had a face-to-face encounter that meets the physician face-to-face encounter requirements with this patient on this date. The encounter with the patient was in whole or in part for the following MEDICAL CONDITION: (primary reason for [place]) MEDICAL NECESSITY: I certify, that based on my findings, NURSING services are a medically necessary home health service. HOME BOUND STATUS: I certify that my clinical findings support that this patient is homebound (i.e., Due to illness or injury, pt requires aid of supportive devices such as crutches, cane, wheelchairs, walkers, the use of special transportation or the assistance of another person to leave their place of residence. There is a normal inability to leave the home and doing so requires considerable and taxing effort. Other absences are for medical reasons / religious services and are infrequent or of short duration when for other reasons). o If current dressing causes regression in wound condition, may D/C ordered dressing product/s and apply Normal Saline Moist Dressing daily until next [place] / Other MD appointment. [place] of regression in wound condition at [phone]. o Please direct any NON-WOUND related issues/requests for orders to  patient's Primary Care Physician Electronic Signature(s) Signed: 04/02/2017 5:01:41 PM By: Linton Ham MD Signed: 04/02/2017 5:49:32 PM By: Gretta Cool, BSN, RN, CWS, Kim RN, BSN Entered By: Gretta Cool, BSN, RN, CWS, Kim on 04/02/2017 10:40:22 [name], [name] ([id number]) -------------------------------------------------------------------------------- Problem List Details Patient Name: Naramore, Carizma V. Date of Service: 04/02/2017 10:15 AM Medical Record Patient Account Number: [id number] Number: Treating RN: Cornell Barman September 18, 1940 (76 y.o. 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 ([id number]) -------------------------------------------------------------------------------- Progress Note Details Patient Name: [name], [name] Date of Service: 04/02/2017 10:15 AM Medical Record Patient Account Number: [id number] Number: Treating RN: Cornell Barman Apr 22, 1941 (76 y.o. Other Clinician: Date of Birth/Sex: Female) Treating ROBSON, [name] Primary Care Provider: Beverlyn Roux Provider/Extender: G Referring Provider: Dorthula Nettles in Treatment: 21 Subjective History of Present Illness (HPI) 08/13/16: This is a 76 year old woman who came predominantly for review of 3 cm in diameter circular wound to the left anterior lateral leg. She was in the ER on 08/01/16 I reviewed their notes. There was apparently pus coming out of the wound at that time and the patient arrived requesting debridement which they don't do in the emergency room. Nevertheless I can't see that they did any x-rays. There were no cultures done. She is a type II diabetic and I a note after the patient was in the clinic that she had a bypass graft from the popliteal to the tibial on the right on 02/28/16. She also had a right greater saphenous vein harvest on the same date for arterial bypass. She is going to have vascular studies including ABIs T ABIs on the right on 08/28/16. The patient's surgery was on 02/28/16 by Dr. Vallarie Mare she had a right below the knee popliteal artery to peroneal artery bypass with reverse greater saphenous vein and an endarterectomy of the mid segment peroneal artery. Postoperatively she had a strong mild monophasic peroneal signal with a pink foot. It would appear that the patient is had some nonhealing in the surgical saphenous vein harvest site on the left leg. Surprisingly looking [facility] I cannot see much information about this at all. Dr. Lucious Groves notes from 05/29/16 show that the patient's wounds "are not healed" the right first metatarsal wound healed but then opened back up. 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 At that point the patient had a wound VAC to her right calf with regards to the wounds on her dorsal right toe  would appear that these are felt to be arterial wounds. She has had surgery on the metatarsal phalangeal in 2015 I Dr. Doran Durand secondary to a right metatarsal phalangeal joint fracture. She is apparently had discoloration around this area since then. 08/28/16; patient arrives with her wounds in much the same condition. The linear vein harvest site and the circular wound below it which I think was a blister. She also has to probing holes in her right great toe and a necrotic eschar on the right second toe. Because of these being arterial wounds I reduced her compression from 3-2 layers this seems to of done satisfactorily she has not had any problems. I cannot see that she is actually had an x-ray ====== 11/06/16 the patient comes in for evaluation of her right lower extremity ulcers. She was here in January 2018 for 2 visits subsequently ended up in the hospital with pneumonia and then to rehabilitation. She has now been discharged from rehabilitation and is home. She has multiple ulcerations to her right lower extremity including the foot and toes. She does have home health in place and they have been placing alginate to the ulcers. She is followed by Dr. Bridgett Larsson of vascular medicine. She is  status post a bypass graft to the right below knee popliteal to peroneal using reverse GSV in July 2017. She recently saw him on 3/23. In office ABIs were: [name], [name] ([id number]) Right 0.48 with monophasic flow to the DP, PT, peroneal Left 0.63 with monophasic flow to the DP and PT Her arterial studies indicated a patent right below knee popliteal to peroneal bypass She had an MRI in February 2008 that was negative frosty myelitis but this showed general soft tissue edema in the right foot and lower extremity concerning for cellulitis She is a diabetic, managed with insulin. Her hemoglobin A1c in December 2017 was 8.4 which is a trend up from previous levels. She had blood work in February 2018 which  revealed an albumin of 2.6 this appears to be relatively acute as an albumin in November 2017 was 3.7 11/13/16; this is a patient I have not seen since February who is readmitted to our clinic last week. 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 Bridgett Larsson. I have reviewed Dr. Lianne Moris notes from March/23/18. Doppler ABI on that date showed an ABI on the right of 0.48 and on the left of 0.63. Dorsalis pedis waveforms were monophasic bilaterally. There was no waveforms detected at the posterior tibial on the right, monophasic on the left. Dr. Lianne Moris comments were that this patient would have follow-up vascular studies in 3 months including ABIs and right lower extremity arterial duplex. She had an MRI in February that was negative for osteomyelitis but showed generalized edema in the foot. Last albumin I see was in January at 3.4 we have been using Santyl to the 4 wounds on the right leg the patient is noted today to have widespread edema well up towards her groin this is pitting 2-3+. 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 Normal ejection fraction. 11/20/16; patient has a follow-up appointment with Dr. Bridgett Larsson on [name] 23. She is still complaining of a lot of pain in the right foot and right leg. It is not clear to me that this is at all positional however I think it is clear claudication with minimal activity perhaps at rest. At our suggestion she is return to her primary physician's office tomorrow with regards to her pitting lower extremity bilateral edema that I reviewed in detail last week 11/27/16; the follow-up with Dr. Bridgett Larsson was actually on May 23 on [name] 23 as I stated in my note last week. N/A case all of her wounds seems somewhat smaller. The 2 on the right leg are definitely smaller. The areas on the dorsal right first toe, right third toe and the lateral part of the  right fifth metatarsal head all looks smaller but have tightly adherent surfaces. We have been using Santyl 12/04/16; follow-up with Dr. Bridgett Larsson on May 23. 2 small open wounds on the right leg continue to get smaller. The area on the right third total lateral aspect of the right fifth toe also look better. The remaining area on the dorsal first toe still has some depth to it. We have been using Santyl to the toes and collagen on the right 12/11/16; according to patient's husband the follow-up with Dr. Bridgett Larsson is not until July. All of the wounds on the right leg are measuring smaller. We have been using some combination of Prisma and Santyl although I think we can go to straight Prisma today.  There may have been some confusion with home health about the primary dressing orders here. 12/25/16; the patient has had some healing this week. The area on her right lateral fifth metatarsal head, right third toe are both healed lower right leg is healed. In the vein harvest site superiorly she has one superficial open area. On the dorsal aspect of her right great toe/MTP joint the wound is now divided into 2 however the proximal area is deep and there is palpable bone 01/01/17; still an open area in the middle of her original right surgical scar. The area on the right third toe and right lateral fifth metatarsal head remained closed. Problematic area on the dorsal aspect of the toe. Previous surgery in this area line 01/08/17 small open area on the original scar on her upper anterior leg although this is closing. X-ray I did of the right first toe did not show underlying bony abnormality. Still this area on the dorsal first toe probes to bone. We have been using Prisma 01/15/17; small open area in the original scar in the upper anterior leg is almost fully closed. She has 2 open areas over the dorsal aspect of the right first toe that probes to bone. Used and a form starting last week. Vigorous bone scraping that I  did last week showed few methicillin sensitive staph aureus. She is allergic to Werling, Damonica V. ([id number]) penicillin and sulfa drugs. I'm going to give her 2 weeks of doxycycline. She will need an MRI with contrast. 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 She continues on doxycycline for a bone scraping that showed methicillin sensitive staph aureus [allergic to penicillin and sulfa]. We have been using Endo form to the wound. 01/29/17; surprisingly her MRI did not show osteomyelitis. She continues on doxycycline for a bone scraping that showed methicillin sensitive staph aureus with allergies to penicillin and sulfa. We have been using Endo form to the wound. Unfortunately I cannot get a surface on this visit looks like it is able to support a healing state. Proximally there is still exposed bone. There is no overt soft tissue tenderness. Her MRI did show a previous fracture was surgery to this area but no hardware 02/12/17 on evaluation today patient appears to be doing well in regard to her lower extremity wound. She does have some mild discomfort but this is minimal. There's no evidence of infection. Her left great toe nail has somewhat been lifted up and there is a little bit of slight bleeding underneath but this is still firmly attached. 02/19/17; she ran out of doxycycline 2 days ago. Nevertheless I would like to continue this for 2 weeks to make a full 6 weeks of therapy as the bone scraping that I did from the open area on the dorsal right first toe showed MRSA. This should complete antibiotic therapy. 02/26/17; the patient is completing her 6 weeks of oral doxycycline. Deep wound on the dorsal right toe. This still has some exposed bone proximally. She does have a reasonably granulated surface albeit thin over bone. I've been using Endo form have made application for an amniotic skin sub 03/05/17; the patient  has completed 6 weeks of doxycycline. This is a deep wound on the dorsal right toe which has exposed bone proximally. She has granulation over most of this wound albeit a thin layer. I've been using Endo form. Still do not have  approval for [facility] 03/13/17 on evaluation today patient's right foot wound appears to be doing better measurement wise compared to her last evaluation. She has been tolerating the dressing change without complication and does have a appointment with the dietary that has been made as far as referral is concerned there just waiting for her and transportation to contact them back for an actual date. Nonetheless patient has been having less pain at this point. No fevers, chills, nausea, or vomiting noted at this time. 03/26/17; linear wound over the dorsal aspect of her right great toe. At one point this had exposed bone on the most superior aspect however I am pleased to see today that this appears to have a surface of granulation. Apparently product was applied for but denied by [facility]. We'll need to see what that was. The patient has known PAD status post revascularization. MRI did not show osteomyelitis which was done in June 04/02/17; continued improvement using Endoform. She was approved for [facility] but hasn't over $427 co-pay per application, this is beyond her means Objective Constitutional Patient is hypertensive.. Pulse regular and within target range for patient.[name] Respirations regular, non-labored and within target range.. Temperature is normal and within the target range for the patient.[name] appears in no distress. [place], Aidaly V. ([id number]) Vitals Time Taken: 10:19 AM, Height: 64 in, Weight: 200 lbs, BMI: 34.3, Temperature: 97.6 F, Pulse: 67 bpm, Respiratory Rate: 16 breaths/min, Blood Pressure: 143/61 mmHg. General Notes: 04/02/17; using a #3 curet I remove necrotic debris from the surface of the wound. Post- debridement healthy granulation and a remarkable  improvement. There is no exposed bone. No evidence of surrounding slight cellulitis or infection. Hemostasis with direct pressure The proximal part of the wound which was where bone was exposed no longer has any evidence of bone exposure. Integumentary (Hair, Skin) Wound #7 status is Open. Original cause of wound was Gradually Appeared. The wound is located on the Right,Dorsal Metatarsal head first. The wound measures 1.8cm length x 0.5cm width x 0.4cm depth; 0.707cm^2 area and 0.283cm^3 volume. There is muscle and Fat Layer (Subcutaneous Tissue) Exposed exposed. There is no tunneling or undermining noted. There is a large amount of serous drainage noted. The wound margin is flat and intact. There is small (1-33%) pink, friable granulation within the wound bed. There is a medium (34-66%) amount of necrotic tissue within the wound bed including Adherent Slough. The periwound skin appearance exhibited: Induration, Scarring, Ecchymosis, Hemosiderin Staining, Mottled. The periwound skin appearance did not exhibit: Maceration. Periwound temperature was noted as No Abnormality. The periwound has tenderness on palpation. 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 There  was a Skin/Subcutaneous Tissue Debridement ([id number]) debridement with total area of 0.9 sq cm performed by Ricard Dillon, MD. with the following instrument(s): Curette to remove Viable tissue/material including Fibrin/Slough and Subcutaneous. A  time out was conducted at 10:38, prior to the start of the Ainley, Anabelle V. ([id number]) procedure. A Minimum amount of bleeding was controlled with Pressure. The procedure was tolerated well with a pain level of 0 throughout and a pain level of 0 following the procedure. Post Debridement Measurements: 1.8cm length x 0.5cm width x 0.5cm depth; 0.353cm^3 volume. Character of Wound/Ulcer Post Debridement is stable. Severity of Tissue Post Debridement is: Fat layer exposed. Post procedure Diagnosis Wound #7: Same as Pre-Procedure Plan Wound Cleansing: Wound #7 Right,Dorsal Metatarsal head first: Clean wound with Normal Saline. Cleanse wound with mild soap and water Anesthetic: Wound #7 Right,Dorsal Metatarsal head first: Topical Lidocaine 4% cream applied to wound bed prior to debridement Primary Wound Dressing: Wound #7 Right,Dorsal Metatarsal head first: Other: - ENDOFORM Secondary Dressing: Wound #7 Right,Dorsal Metatarsal head first: Conform/Kerlix Foam Dressing Change Frequency: Wound #7 Right,Dorsal Metatarsal head first: Change Dressing Monday, Wednesday, Friday Follow-up Appointments: Wound #7 Right,Dorsal Metatarsal head first: Return Appointment in 1 week. Edema Control: Wound #7 Right,Dorsal Metatarsal head first: Elevate legs to the level of the heart and pump ankles as often as possible Additional Orders / Instructions: Wound #7 Right,Dorsal Metatarsal head first: Increase protein intake. Activity as tolerated Home Health: Wound #7 Right,Dorsal Metatarsal head first: [place] [facility] weekly, [facility] Home Health Nurse may visit PRN to address patient s wound care  needs. FACE TO FACE ENCOUNTER: MEDICARE and MEDICAID PATIENTS: I certify that this patient is under my care and that I had a face-to-face encounter that meets the physician face-to-face encounter requirements with this patient on this date. The encounter with the patient was in whole or in part for the following MEDICAL CONDITION: (primary reason for [place]) MEDICAL NECESSITY: I certify, that based on my findings, NURSING services are a medically necessary home health service. HOME [place], Clarinda ([id number]) BOUND STATUS: I certify that my clinical findings support that this patient is homebound (i.e., Due to illness or injury, pt requires aid of supportive devices such as crutches, cane, wheelchairs, walkers, the use of special transportation or the assistance of another person to leave their place of residence. There is a normal inability to leave the home and doing so requires considerable and taxing effort. Other absences are for medical reasons / religious services and are infrequent or of short duration when for other reasons). If current dressing causes regression in wound condition, may D/C ordered dressing product/s and apply Normal Saline Moist Dressing daily until next [place] / Other MD appointment. [place] of regression in wound condition at [phone]. Please direct any NON-WOUND related issues/requests for orders to patient's Primary Care Physician #1 continued gratifying improvement but still requiring debridement #2 continue Endoform being [facility] Monday and Friday and she sees [place] on Wednesdays Electronic Signature(s) Signed: 04/14/2017 12:53:54 PM By: Linton Ham MD Signed: 04/15/2017 8:11:33 AM By: Gretta Cool, BSN, RN, CWS, Kim RN, BSN Previous Signature: 04/02/2017 5:01:41 PM Version By: Linton Ham MD Entered By: Gretta Cool, BSN, RN, CWS, Kim on 04/10/2017 11:27:00 Grondahl, Kawanda [name]  ([id number]) -------------------------------------------------------------------------------- [place] Details Patient Name: Mcconnell, Chevonne V. Date of Service: 04/02/2017 Medical Record Patient Account Number: [id number] Number: Treating RN: Cornell Barman 29-Dec-1940 (76 y.o. 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 I70.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 of foot Facility Procedures CPT4: Description Modifier Quantity Code [id number] 11042 - DEB SUBQ TISSUE 20 SQ CM/< 1 ICD-10 Description Diagnosis E11.621 Type 2 diabetes mellitus with foot ulcer 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

## 2017-04-09 ENCOUNTER — Encounter: Payer: Medicare HMO | Attending: Internal Medicine | Admitting: Internal Medicine

## 2017-04-09 DIAGNOSIS — I70232 Atherosclerosis 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 of calf: Secondary | ICD-10-CM | POA: Insufficient documentation

## 2017-04-09 DIAGNOSIS — I110 Hypertensive heart disease with heart failure: Secondary | ICD-10-CM | POA: Diagnosis not present

## 2017-04-09 DIAGNOSIS — Z91012 Allergy to eggs: Secondary | ICD-10-CM | POA: Insufficient documentation

## 2017-04-09 DIAGNOSIS — L97219 Non-pressure chronic ulcer of right calf with unspecified severity: Secondary | ICD-10-CM | POA: Insufficient documentation

## 2017-04-09 DIAGNOSIS — Z888 Allergy status to other drugs, medicaments and biological substances status: Secondary | ICD-10-CM | POA: Diagnosis not present

## 2017-04-09 DIAGNOSIS — D649 Anemia, unspecified: Secondary | ICD-10-CM | POA: Insufficient documentation

## 2017-04-09 DIAGNOSIS — E785 Hyperlipidemia, unspecified: Secondary | ICD-10-CM | POA: Insufficient documentation

## 2017-04-09 DIAGNOSIS — I70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 of foot: Secondary | ICD-10-CM | POA: Insufficient documentation

## 2017-04-09 DIAGNOSIS — E43 Unspecified severe protein-calorie malnutrition: Secondary | ICD-10-CM | POA: Insufficient documentation

## 2017-04-09 DIAGNOSIS — L97519 Non-pressure chronic ulcer of other part of right foot with unspecified severity: Secondary | ICD-10-CM | POA: Diagnosis not present

## 2017-04-09 DIAGNOSIS — Z794 Long term (current) use of insulin: Secondary | ICD-10-CM | POA: Insufficient documentation

## 2017-04-09 DIAGNOSIS — I509 Heart failure, unspecified: Secondary | ICD-10-CM | POA: Diagnosis not present

## 2017-04-09 DIAGNOSIS — E11621 Type 2 diabetes mellitus with foot ulcer: Secondary | ICD-10-CM | POA: Diagnosis not present

## 2017-04-09 DIAGNOSIS — Z880 Allergy status to penicillin: Secondary | ICD-10-CM | POA: Diagnosis not present

## 2017-04-09 DIAGNOSIS — I350 Nonrheumatic aortic (valve) stenosis: Secondary | ICD-10-CM | POA: Insufficient documentation

## 2017-04-09 DIAGNOSIS — E1152 Type 2 diabetes mellitus with diabetic peripheral angiopathy with gangrene: Secondary | ICD-10-CM | POA: Insufficient documentation

## 2017-04-09 DIAGNOSIS — Z91013 Allergy to seafood: Secondary | ICD-10-CM | POA: Diagnosis not present

## 2017-04-09 DIAGNOSIS — Z882 Allergy status to sulfonamides status: Secondary | ICD-10-CM | POA: Diagnosis not present

## 2017-04-09 DIAGNOSIS — Z8673 Personal history of transient ischemic attack (TIA), and cerebral infarction without residual deficits: Secondary | ICD-10-CM | POA: Diagnosis not present

## 2017-04-09 DIAGNOSIS — E11622 Type 2 diabetes mellitus with other skin ulcer: Secondary | ICD-10-CM | POA: Diagnosis present

## 2017-04-11 NOTE — Progress Notes (Signed)
LOLAMAE, VOISIN ([id number]) Visit Report for 04/09/2017 Debridement Details Patient Name: [name], [name] Date of Service: 04/09/2017 10:15 AM Medical Record Patient Account Number: [id number] Number: Treating RN: Cornell Barman 05/08/1941 (76 y.o. Other Clinician: Date of Birth/Sex: Female) Treating ROBSON, [name] Primary Care Provider: Beverlyn Roux Provider/Extender: G Referring Provider: Dorthula Nettles in Treatment: 22 Debridement Performed for Wound #7 Right,Dorsal Metatarsal head first Assessment: Performed By: Physician Ricard Dillon, MD Debridement: Debridement Severity of Tissue Pre Fat layer exposed Debridement: Pre-procedure Verification/Time Out Yes - 10:37 Taken: Start Time: 10:38 Pain Control: Lidocaine 4% Topical Solution Level: Skin/Subcutaneous Tissue Total Area Debrided (L x 1.8 (cm) x 0.5 (cm) = 0.9 (cm) W): Tissue and other Viable, Non-Viable, Exudate, Fibrin/Slough, Subcutaneous material debrided: Instrument: Curette Bleeding: Minimum Hemostasis Achieved: Pressure End Time: 10:41 Procedural Pain: 0 Post Procedural Pain: 0 Response to Treatment: Procedure was tolerated well Post Debridement Measurements of Total Wound Length: (cm) 1.8 Width: (cm) 0.5 Depth: (cm) 0.4 Volume: (cm) 0.283 Character of Wound/Ulcer Post Requires Further Debridement Debridement: Severity of Tissue Post Debridement: Fat layer exposed Post Procedure Diagnosis Same as Pre-procedure DERRIAN, RODAK ([id number]) Electronic Signature(s) Signed: 04/09/2017 5:15:21 PM By: Linton Ham MD Signed: 04/09/2017 5:52:49 PM By: Gretta Cool, BSN, RN, CWS, Kim RN, BSN Entered By: Linton Ham on 04/09/2017 10:49:55 Rohrbach, Emalie V. ([id number]) -------------------------------------------------------------------------------- HPI Details Patient Name: [name], [name] Date of Service: 04/09/2017 10:15 AM Medical Record Patient Account Number:  [id number] Number: Treating RN: Cornell Barman 1940/10/30 (76 y.o. Other Clinician: Date of Birth/Sex: Female) Treating ROBSON, [name] Primary Care Provider: Beverlyn Roux Provider/Extender: G Referring Provider: Dorthula Nettles in Treatment: 22 History of Present Illness HPI Description: 08/13/16: This is a 76 year old woman who came predominantly for review of 3 cm in diameter circular wound to the left anterior lateral leg. She was in the ER on 08/01/16 I reviewed their notes. There was apparently pus coming out of the wound at that time and the patient arrived requesting debridement which they don't do in the emergency room. Nevertheless I can't see that they did any x-rays. There were no cultures done. She is a type II diabetic and I a note after the patient was in the clinic that she had a bypass graft from the popliteal to the tibial on the right on 02/28/16. She also had a right greater saphenous vein harvest on the same date for arterial bypass. She is going to have vascular studies including ABIs T ABIs on the right on 08/28/16. The patient's surgery was on 02/28/16 by Dr. Vallarie Mare she had a right below the knee popliteal artery to peroneal artery bypass with reverse greater saphenous vein and an endarterectomy of the mid segment peroneal artery. Postoperatively she had a strong mild monophasic peroneal signal with a pink foot. It would appear that the patient is had some nonhealing in the surgical saphenous vein harvest site on the left leg. Surprisingly [facility] I cannot see much information about this at all. Dr. Lucious Groves notes from 05/29/16 show that the patient's wounds "are not healed" the right first metatarsal wound healed but then opened back up. 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 At that point the patient had a wound VAC to her right  calf with regards to the wounds on her dorsal right toe would appear that these are felt to be arterial wounds. She has had surgery  on the metatarsal phalangeal in 2015 I Dr. Doran Durand secondary to a right metatarsal phalangeal joint fracture. She is apparently had discoloration around this area since then. 08/28/16; patient arrives with her wounds in much the same condition. The linear vein harvest site and the circular wound below it which I think was a blister. She also has to probing holes in her right great toe and a necrotic eschar on the right second toe. Because of these being arterial wounds I reduced her compression from 3-2 layers this seems to of done satisfactorily she has not had any problems. I cannot see that she is actually had an x-ray ====== 11/06/16 the patient comes in for evaluation of her right lower extremity ulcers. She was here in January 2018 for 2 visits subsequently ended up in the hospital with pneumonia and then to rehabilitation. She has now been discharged from rehabilitation and is home. She has multiple ulcerations to her right lower extremity including the foot and toes. She does have home health in place and they have been placing alginate to the ulcers. She is followed by Dr. Bridgett Larsson of vascular medicine. 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 She recently saw him on 3/23. In office ABIs were: [name] ([id number]) Right 0.48 with monophasic flow to the DP, PT, peroneal Left 0.63 with monophasic flow to the DP and PT Her arterial studies indicated a patent right below knee popliteal to peroneal bypass She had an MRI in February 2008 that was negative frosty myelitis but this showed general soft tissue edema in the right foot and lower extremity concerning for cellulitis She is a diabetic, managed with insulin. Her hemoglobin A1c in December 2017 was 8.4 which is a trend up from  previous levels. She had blood work in February 2018 which revealed an albumin of 2.6 this appears to be relatively acute as an albumin in November 2017 was 3.7 11/13/16; this is a patient I have not seen since February who is readmitted to our clinic last week. 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 Bridgett Larsson. I have reviewed Dr. Lianne Moris notes from March/23/18. Doppler ABI on that date showed an ABI on the right of 0.48 and on the left of 0.63. Dorsalis pedis waveforms were monophasic bilaterally. There was no waveforms detected at the posterior tibial on the right, monophasic on the left. Dr. Lianne Moris comments were that this patient would have follow-up vascular studies in 3 months including ABIs and right lower extremity arterial duplex. She had an MRI in February that was negative for osteomyelitis but showed generalized edema in the foot. Last albumin I see was in January at 3.4 we have been using Santyl to the 4 wounds on the right leg the patient is noted today to have widespread edema well up towards her groin this is pitting 2-3+. 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 Normal ejection fraction. 11/20/16; patient has a follow-up appointment with Dr. Bridgett Larsson on April 23. She is still complaining of a lot of pain in the right foot and right leg. It is not clear to me that this is at all positional however I think it is clear claudication with minimal activity perhaps at rest. At our suggestion she is return to her primary physician's office tomorrow with regards to her  pitting lower extremity bilateral edema that I reviewed in detail last week 11/27/16; the follow-up with Dr. Bridgett Larsson was actually on May 23 on April 23 as I stated in my note last week. N/A case all of her wounds seems somewhat smaller. The 2 on the right leg are definitely smaller. The areas on the dorsal  right first toe, right third toe and the lateral part of the right fifth metatarsal head all looks smaller but have tightly adherent surfaces. We have been using Santyl 12/04/16; follow-up with Dr. Bridgett Larsson on May 23. 2 small open wounds on the right leg continue to get smaller. The area on the right third total lateral aspect of the right fifth toe also look better. The remaining area on the dorsal first toe still has some depth to it. We have been using Santyl to the toes and collagen on the right 12/11/16; according to patient's husband the follow-up with Dr. Bridgett Larsson is not until July. All of the wounds on the right leg are measuring smaller. We have been using some combination of Prisma and Santyl although I think we can go to straight Prisma today. There may have been some confusion with home health about the primary dressing orders here. 12/25/16; the patient has had some healing this week. The area on her right lateral fifth metatarsal head, right third toe are both healed lower right leg is healed. In the vein harvest site superiorly she has one superficial open area. On the dorsal aspect of her right great toe/MTP joint the wound is now divided into 2 however the proximal area is deep and there is palpable bone 01/01/17; still an open area in the middle of her original right surgical scar. The area on the right third toe and right lateral fifth metatarsal head remained closed. Problematic area on the dorsal aspect of the toe. Previous surgery in this area line 01/08/17 small open area on the original scar on her upper anterior leg although this is closing. X-ray I did of the right first toe did not show underlying bony abnormality. Still this area on the dorsal first toe probes to bone. We have been using Prisma 01/15/17; small open area in the original scar in the upper anterior leg is almost fully closed. She has 2 open areas over the dorsal aspect of the right first toe that probes to bone. Used and  a form starting last week. Vigorous bone scraping that I did last week showed few methicillin sensitive staph aureus. She is allergic to Sydnor, Dorette V. ([id number]) penicillin and sulfa drugs. I'm going to give her 2 weeks of doxycycline. She will need an MRI with contrast. 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 She continues on doxycycline for a bone scraping that showed methicillin sensitive staph aureus [allergic to penicillin and sulfa]. We have been using Endo form to the wound. 01/29/17; surprisingly her MRI did not show osteomyelitis. She continues on doxycycline for a bone scraping that showed methicillin sensitive staph aureus with allergies to penicillin and sulfa. We have been using Endo form to the wound. Unfortunately I cannot get a surface on this visit looks like it is able to support a healing state. Proximally there is still exposed bone. There is no overt soft tissue tenderness. Her MRI did show a previous fracture was surgery to this area but no hardware 02/12/17 on evaluation today patient appears to be  doing well in regard to her lower extremity wound. She does have some mild discomfort but this is minimal. There's no evidence of infection. Her left great toe nail has somewhat been lifted up and there is a little bit of slight bleeding underneath but this is still firmly attached. 02/19/17; she ran out of doxycycline 2 days ago. Nevertheless I would like to continue this for 2 weeks to make a full 6 weeks of therapy as the bone scraping that I did from the open area on the dorsal right first toe showed MRSA. This should complete antibiotic therapy. 02/26/17; the patient is completing her 6 weeks of oral doxycycline. Deep wound on the dorsal right toe. This still has some exposed bone proximally. She does have a reasonably granulated surface albeit thin over bone. I've been using Endo form have made  application for an amniotic skin sub 03/05/17; the patient has completed 6 weeks of doxycycline. This is a deep wound on the dorsal right toe which has exposed bone proximally. She has granulation over most of this wound albeit a thin layer. I've been using Endo form. Still do not have approval for [employer] 03/13/17 on evaluation today patient's right foot wound appears to be doing better measurement wise compared to her last evaluation. She has been tolerating the dressing change without complication and does have a appointment with the dietary that has been made as far as referral is concerned there just waiting for her and transportation to contact them back for an actual date. Nonetheless patient has been having less pain at this point. No fevers, chills, nausea, or vomiting noted at this time. 03/26/17; linear wound over the dorsal aspect of her right great toe. At one point this had exposed bone on the most superior aspect however I am pleased to see today that this appears to have a surface of granulation. Apparently product was applied for but denied by [facility]. We'll need to see what that was. The patient has known PAD status post revascularization. MRI did not show osteomyelitis which was done in June 04/02/17; continued improvement using Endoform. She was approved for [facility] but hasn't over $132 co-pay per application, this is beyond her means 04/09/17; continues on Endoform. Electronic Signature(s) Signed: 04/09/2017 5:15:21 PM By: Linton Ham MD Entered By: Linton Ham on 04/09/2017 10:50:23 Harlin, Ambree [name] ([id number]) -------------------------------------------------------------------------------- Physical Exam Details Patient Name: [name], [name] Date of Service: 04/09/2017 10:15 AM Medical Record Patient Account Number: [id number] Number: Treating RN: Cornell Barman [birth year] (76 y.o. Other Clinician: Date of Birth/Sex: Female) Treating ROBSON, [name] Primary Care  Provider: Beverlyn Roux Provider/Extender: G Referring Provider: Dorthula Nettles in Treatment: 22 Constitutional Sitting or standing Blood Pressure is within target range for patient.. Pulse regular and within target range for patient.[name] Respirations regular, non-labored and within target range.. Temperature is normal and within the target range for the patient.[name] appears in no distress. Notes Wound exam; using a #3 curet necrotic surface tissue removed from the surface of the wound post debridement again we have healthy granulation and I think less depth. oThere is some undermining in the proximal part of the wound however certainly no exposed bone Electronic Signature(s) Signed: 04/09/2017 5:15:21 PM By: Linton Ham MD Entered By: Linton Ham on 04/09/2017 10:51:35 Canepa, Remona [name] ([id number]) -------------------------------------------------------------------------------- Physician Orders Details Patient Name: [name], [name] Date of Service: 04/09/2017 10:15 AM Medical Record Patient Account Number: [id number] Number: Treating RN: Ahmed Prima Mar 23, 1941 (76 y.o. Other Clinician: Date of Birth/Sex:  Female) Treating ROBSON, [name] Primary Care Provider: Beverlyn Roux Provider/Extender: G Referring Provider: Dorthula Nettles in Treatment: 20 Verbal / Phone Orders: No Diagnosis Coding Wound Cleansing Wound #7 Right,Dorsal Metatarsal head first o Clean wound with Normal Saline. o Cleanse wound with mild soap and water Anesthetic Wound #7 Right,Dorsal Metatarsal head first o Topical Lidocaine 4% cream applied to wound bed prior to debridement Primary Wound Dressing Wound #7 Right,Dorsal Metatarsal head first o Other: - ENDOFORM Secondary Dressing Wound #7 Right,Dorsal Metatarsal head first o Conform/Kerlix o Foam Dressing Change Frequency Wound #7 Right,Dorsal Metatarsal head first o Change Dressing Monday, Wednesday, Friday Follow-up  Appointments Wound #7 Right,Dorsal Metatarsal head first o Return Appointment in 1 week. Edema Control Wound #7 Right,Dorsal Metatarsal head first o Elevate legs to the level of the heart and pump ankles as often as possible Additional Orders / Instructions Wound #7 Right,Dorsal Metatarsal head first o Increase protein intake. Sabala, KINDALL SWABY ([id number]) o Activity as tolerated Home Health Wound #7 Right,Dorsal Metatarsal head first o [place] [facility] - [facility] twice weekly, [facility]-Wednesday o Home Health Nurse may visit PRN to address patientos wound care needs. o FACE TO FACE ENCOUNTER: MEDICARE and MEDICAID PATIENTS: I certify that this patient is under my care and that I had a face-to-face encounter that meets the physician face-to-face encounter requirements with this patient on this date. The encounter with the patient was in whole or in part for the following MEDICAL CONDITION: (primary reason for [place]) MEDICAL NECESSITY: I certify, that based on my findings, NURSING services are a medically necessary home health service. HOME BOUND STATUS: I certify that my clinical findings support that this patient is homebound (i.e., Due to illness or injury, pt requires aid of supportive devices such as crutches, cane, wheelchairs, walkers, the use of special transportation or the assistance of another person to leave their place of residence. There is a normal inability to leave the home and doing so requires considerable and taxing effort. Other absences are for medical reasons / religious services and are infrequent or of short duration when for other reasons). o If current dressing causes regression in wound condition, may D/C ordered dressing product/s and apply Normal Saline Moist Dressing daily until next [place] / Other MD appointment. [place] of regression in wound condition at [phone]. o  Please direct any NON-WOUND related issues/requests for orders to patient's Primary Care Physician Electronic Signature(s) Signed: 04/09/2017 5:15:21 PM By: Linton Ham MD Signed: 04/09/2017 5:43:38 PM By: Alric Quan Entered By: Alric Quan on 04/09/2017 10:41:44 Regino, Tyese V. ([id number]) -------------------------------------------------------------------------------- Problem List Details Patient Name: [name], [name] Date of Service: 04/09/2017 10:15 AM Medical Record Patient Account Number: [id number] Number: Treating RN: Cornell Barman 1940-08-06 (76 y.o. 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 ([id number]) -------------------------------------------------------------------------------- Progress Note Details Patient Name: [name], [name] Date of Service: 04/09/2017 10:15 AM Medical Record Patient Account Number:  [id number] Number: Treating RN: Cornell Barman 12-Aug-1940 (76 y.o. Other Clinician: Date of Birth/Sex: Female) Treating ROBSON, [name] Primary Care Provider: Beverlyn Roux Provider/Extender: G Referring Provider: Dorthula Nettles in Treatment: 22 Subjective History of Present Illness (HPI) 08/13/16: This is a 76 year old woman who came predominantly for review of 3 cm in diameter circular wound to the left anterior lateral leg. She was in the ER on 08/01/16 I reviewed their notes. There was apparently pus coming out of the wound at that time and the patient arrived requesting debridement which they don't do in the emergency room. Nevertheless I can't see that they did any x-rays. There were no cultures done. She is a type II diabetic and I a note after the patient was in the clinic that she had a bypass graft from the popliteal to the tibial on the right on 02/28/16. She also had a right greater saphenous vein harvest on the same date for arterial bypass. She is going to have vascular studies including ABIs T ABIs on the right on 08/28/16. The patient's surgery was on 02/28/16 by Dr. Vallarie Mare she had a right below the knee popliteal artery to peroneal artery bypass with reverse greater saphenous vein and an endarterectomy of the mid segment peroneal artery. Postoperatively she had a strong mild monophasic peroneal signal with a pink foot. It would appear that the patient is had some nonhealing in the surgical saphenous vein harvest site on the left leg. Surprisingly looking [facility] I cannot see much information about this at all. Dr. Lucious Groves notes from 05/29/16 show that the patient's wounds "are not healed" the right first metatarsal wound healed but then opened back up. 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 At that point the patient had a wound VAC to her right  calf with regards to the wounds on her dorsal right toe would appear that these are felt to be arterial wounds. She has had surgery on the metatarsal phalangeal in 2015 I Dr. Doran Durand secondary to a right metatarsal phalangeal joint fracture. She is apparently had discoloration around this area since then. 08/28/16; patient arrives with her wounds in much the same condition. The linear vein harvest site and the circular wound below it which I think was a blister. She also has to probing holes in her right great toe and a necrotic eschar on the right second toe. Because of these being arterial wounds I reduced her compression from 3-2 layers this seems to of done satisfactorily she has not had any problems. I cannot see that she is actually had an x-ray ====== 11/06/16 the patient comes in for evaluation of her right lower extremity ulcers. She was here in January 2018 for 2 visits subsequently ended up in the hospital with pneumonia and then to rehabilitation. She has now been discharged from rehabilitation and is home. She has multiple ulcerations to her right lower extremity including the foot and toes. She does have home health in place and they have been placing alginate to the ulcers. She is followed by Dr. Bridgett Larsson of vascular medicine. She is status  post a bypass graft to the right below knee popliteal to peroneal using reverse GSV in July 2017. She recently saw him on 3/23. In office ABIs were: Phagan, ZIYA COONROD. ([id number]) Right 0.48 with monophasic flow to the DP, PT, peroneal Left 0.63 with monophasic flow to the DP and PT Her arterial studies indicated a patent right below knee popliteal to peroneal bypass She had an MRI in February 2008 that was negative frosty myelitis but this showed general soft tissue edema in the right foot and lower extremity concerning for cellulitis She is a diabetic, managed with insulin. Her hemoglobin A1c in December 2017 was 8.4 which is a trend up from  previous levels. She had blood work in February 2018 which revealed an albumin of 2.6 this appears to be relatively acute as an albumin in November 2017 was 3.7 11/13/16; this is a patient I have not seen since February who is readmitted to our clinic last week. 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 Bridgett Larsson. I have reviewed Dr. Lianne Moris notes from March/23/18. Doppler ABI on that date showed an ABI on the right of 0.48 and on the left of 0.63. Dorsalis pedis waveforms were monophasic bilaterally. There was no waveforms detected at the posterior tibial on the right, monophasic on the left. Dr. Lianne Moris comments were that this patient would have follow-up vascular studies in 3 months including ABIs and right lower extremity arterial duplex. She had an MRI in February that was negative for osteomyelitis but showed generalized edema in the foot. Last albumin I see was in January at 3.4 we have been using Santyl to the 4 wounds on the right leg the patient is noted today to have widespread edema well up towards her groin this is pitting 2-3+. 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 Normal ejection fraction. 11/20/16; patient has a follow-up appointment with Dr. Bridgett Larsson on April 23. She is still complaining of a lot of pain in the right foot and right leg. It is not clear to me that this is at all positional however I think it is clear claudication with minimal activity perhaps at rest. At our suggestion she is return to her primary physician's office tomorrow with regards to her pitting lower extremity bilateral edema that I reviewed in detail last week 11/27/16; the follow-up with Dr. Bridgett Larsson was actually on May 23 on April 23 as I stated in my note last week. N/A case all of her wounds seems somewhat smaller. The 2 on the right leg are definitely smaller. The areas on the dorsal  right first toe, right third toe and the lateral part of the right fifth metatarsal head all looks smaller but have tightly adherent surfaces. We have been using Santyl 12/04/16; follow-up with Dr. Bridgett Larsson on May 23. 2 small open wounds on the right leg continue to get smaller. The area on the right third total lateral aspect of the right fifth toe also look better. The remaining area on the dorsal first toe still has some depth to it. We have been using Santyl to the toes and collagen on the right 12/11/16; according to patient's husband the follow-up with Dr. Bridgett Larsson is not until July. All of the wounds on the right leg are measuring smaller. We have been using some combination of Prisma and Santyl although I think we can go to straight Prisma today. There  may have been some confusion with home health about the primary dressing orders here. 12/25/16; the patient has had some healing this week. The area on her right lateral fifth metatarsal head, right third toe are both healed lower right leg is healed. In the vein harvest site superiorly she has one superficial open area. On the dorsal aspect of her right great toe/MTP joint the wound is now divided into 2 however the proximal area is deep and there is palpable bone 01/01/17; still an open area in the middle of her original right surgical scar. The area on the right third toe and right lateral fifth metatarsal head remained closed. Problematic area on the dorsal aspect of the toe. Previous surgery in this area line 01/08/17 small open area on the original scar on her upper anterior leg although this is closing. X-ray I did of the right first toe did not show underlying bony abnormality. Still this area on the dorsal first toe probes to bone. We have been using Prisma 01/15/17; small open area in the original scar in the upper anterior leg is almost fully closed. She has 2 open areas over the dorsal aspect of the right first toe that probes to bone. Used and  a form starting last week. Vigorous bone scraping that I did last week showed few methicillin sensitive staph aureus. She is allergic to Treat, [name] ([id number]) penicillin and sulfa drugs. I'm going to give her 2 weeks of doxycycline. She will need an MRI with contrast. 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 She continues on doxycycline for a bone scraping that showed methicillin sensitive staph aureus [allergic to penicillin and sulfa]. We have been using Endo form to the wound. 01/29/17; surprisingly her MRI did not show osteomyelitis. She continues on doxycycline for a bone scraping that showed methicillin sensitive staph aureus with allergies to penicillin and sulfa. We have been using Endo form to the wound. Unfortunately I cannot get a surface on this visit looks like it is able to support a healing state. Proximally there is still exposed bone. There is no overt soft tissue tenderness. Her MRI did show a previous fracture was surgery to this area but no hardware 02/12/17 on evaluation today patient appears to be doing well in regard to her lower extremity wound. She does have some mild discomfort but this is minimal. There's no evidence of infection. Her left great toe nail has somewhat been lifted up and there is a little bit of slight bleeding underneath but this is still firmly attached. 02/19/17; she ran out of doxycycline 2 days ago. Nevertheless I would like to continue this for 2 weeks to make a full 6 weeks of therapy as the bone scraping that I did from the open area on the dorsal right first toe showed MRSA. This should complete antibiotic therapy. 02/26/17; the patient is completing her 6 weeks of oral doxycycline. Deep wound on the dorsal right toe. This still has some exposed bone proximally. She does have a reasonably granulated surface albeit thin over bone. I've been using Endo form have made  application for an amniotic skin sub 03/05/17; the patient has completed 6 weeks of doxycycline. This is a deep wound on the dorsal right toe which has exposed bone proximally. She has granulation over most of this wound albeit a thin layer. I've been using Endo form. Still do not have approval  for [facility] 03/13/17 on evaluation today patient's right foot wound appears to be doing better measurement wise compared to her last evaluation. She has been tolerating the dressing change without complication and does have a appointment with the dietary that has been made as far as referral is concerned there just waiting for her and transportation to contact them back for an actual date. Nonetheless patient has been having less pain at this point. No fevers, chills, nausea, or vomiting noted at this time. 03/26/17; linear wound over the dorsal aspect of her right great toe. At one point this had exposed bone on the most superior aspect however I am pleased to see today that this appears to have a surface of granulation. Apparently product was applied for but denied by [facility]. We'll need to see what that was. The patient has known PAD status post revascularization. MRI did not show osteomyelitis which was done in June 04/02/17; continued improvement using Endoform. She was approved for [facility] but hasn't over $102 co-pay per application, this is beyond her means 04/09/17; continues on Endoform. Objective Constitutional Sitting or standing Blood Pressure is within target range for patient.. Pulse regular and within target range for patient.[name] Respirations regular, non-labored and within target range.. Temperature is normal and within [name], [name] ([id number]) the target range for the patient.[name] appears in no distress. Vitals Time Taken: 10:27 AM, Height: 64 in, Weight: 200 lbs, BMI: 34.3, Temperature: 97.8 F, Pulse: 78 bpm, Respiratory Rate: 16 breaths/min, Blood Pressure: 139/54 mmHg. General Notes: Wound  exam; using a #3 curet necrotic surface tissue removed from the surface of the wound post debridement again we have healthy granulation and I think less depth. There is some undermining in the proximal part of the wound however certainly no exposed bone Integumentary (Hair, Skin) Wound #7 status is Open. Original cause of wound was Gradually Appeared. The wound is located on the Right,Dorsal Metatarsal head first. The wound measures 1.8cm length x 0.5cm width x 0.3cm depth; 0.707cm^2 area and 0.212cm^3 volume. There is muscle and Fat Layer (Subcutaneous Tissue) Exposed exposed. There is no tunneling or undermining noted. There is a large amount of serous drainage noted. The wound margin is flat and intact. There is medium (34-66%) pink, friable granulation within the wound bed. There is a medium (34-66%) amount of necrotic tissue within the wound bed including Adherent Slough. The periwound skin appearance exhibited: Induration, Scarring, Ecchymosis, Hemosiderin Staining, Mottled. The periwound skin appearance did not exhibit: Maceration. Periwound temperature was noted as No Abnormality. The periwound has tenderness on palpation. 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 There was a Skin/Subcutaneous Tissue Debridement ([id number]) debridement with total area of 0.9 sq cm performed by Ricard Dillon, MD. with the following instrument(s): Curette to remove Viable and Non-Viable Sneed, Colleene V. ([id number]) tissue/material including Exudate, Fibrin/Slough, and  Subcutaneous after achieving pain control using Lidocaine 4% Topical Solution. A time out was conducted at 10:37, prior to the start of the procedure. A Minimum amount of bleeding was controlled with Pressure. The procedure was tolerated well with a pain level of 0 throughout and a pain level of 0 following the procedure. Post Debridement Measurements: 1.8cm length x 0.5cm width x 0.4cm depth; 0.283cm^3 volume. Character of Wound/Ulcer Post Debridement requires further debridement. Severity of Tissue Post Debridement is: Fat layer exposed. Post procedure Diagnosis Wound #7: Same as Pre-Procedure Plan Wound Cleansing: Wound #7 Right,Dorsal Metatarsal head first: Clean wound with Normal Saline. Cleanse wound with mild soap and water Anesthetic: Wound #7 Right,Dorsal Metatarsal head first: Topical Lidocaine 4% cream applied to wound bed prior to debridement Primary Wound Dressing: Wound #7 Right,Dorsal Metatarsal head first: Other: - ENDOFORM Secondary Dressing: Wound #7 Right,Dorsal Metatarsal head first: Conform/Kerlix Foam Dressing Change Frequency: Wound #7 Right,Dorsal Metatarsal head first: Change Dressing Monday, Wednesday, Friday Follow-up Appointments: Wound #7 Right,Dorsal Metatarsal head first: Return Appointment in 1 week. Edema Control: Wound #7 Right,Dorsal Metatarsal head first: Elevate legs to the level of the heart and pump ankles as often as possible Additional Orders / Instructions: Wound #7 Right,Dorsal Metatarsal head first: Increase protein intake. Activity as tolerated Home Health: Wound #7 Right,Dorsal Metatarsal head first: [place]  Visits - [facility] twice weekly, [facility] Home Health Nurse may visit [facility] to address patient s wound care needs. FACE TO FACE ENCOUNTER: MEDICARE and MEDICAID PATIENTS: I certify that this patient is under my care and that I had a face-to-face encounter that meets the physician face-to-face encounter requirements with this patient on this date. The encounter with the patient was in whole or in part for the Ruhe, Phila V. ([id number]) following MEDICAL CONDITION: (primary reason for [place]) MEDICAL NECESSITY: I certify, that based on my findings, NURSING services are a medically necessary home health service. HOME BOUND STATUS: I certify that my clinical findings support that this patient is homebound (i.e., Due to illness or injury, pt requires aid of supportive devices such as crutches, cane, wheelchairs, walkers, the use of special transportation or the assistance of another person to leave their place of residence. There is a normal inability to leave the home and doing so requires considerable and taxing effort. Other absences are for medical reasons / religious services and are infrequent or of short duration when for other reasons). If current dressing causes regression in wound condition, may D/C ordered dressing product/s and apply Normal Saline Moist Dressing daily until next [place] / Other MD appointment. [place] of regression in wound condition at [phone]. Please direct any NON-WOUND related issues/requests for orders to patient's Primary Care Physician #1 continue Endoform, we've been making nice progress #2 we do not have an advanced treatment option here which is unfortunate however we are making progress with the Endoform. #3 there is no evidence of surrounding infection Electronic Signature(s) Signed: 04/09/2017 5:15:21 PM By: Linton Ham MD Entered By: Linton Ham on 04/09/2017 10:52:40 Baltazar, Brynli V.  ([id number]) -------------------------------------------------------------------------------- SuperBill Details Patient Name: [name], [name] Date of Service: 04/09/2017 Medical Record Patient Account Number: [id number] Number: Treating RN: Cornell Barman 1941-02-28 (76 y.o. 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 I70.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 of foot Facility Procedures CPT4: Description Modifier Quantity Code [id number] 11042 - DEB SUBQ TISSUE 20 SQ CM/< 1 ICD-10 Description Diagnosis E11.622 Type 2 diabetes mellitus with other skin ulcer L97.519 Non-pressure chronic ulcer of other part of right foot with unspecified  severity Physician Procedures CPT4: Description Modifier Quantity Code [id number] 11042 - WC PHYS SUBQ TISS 20 SQ CM 1 ICD-10 Description Diagnosis E11.622 Type 2 diabetes mellitus with other skin ulcer L97.519 Non-pressure chronic ulcer of other part of right foot with unspecified  severity Electronic Signature(s) Signed: 04/09/2017 5:15:21 PM By: Linton Ham MD Mcelhinny, [name] ([id number]) Entered By: Linton Ham on 04/09/2017 10:53:02

## 2017-04-11 NOTE — Progress Notes (Signed)
ROSHELLE, White ([id number]) Visit Report for 04/09/2017 Arrival Information Details Patient Name: [name], [name] Date of Service: 04/09/2017 10:15 AM Medical Record Number: [id number] Patient Account Number: [id number] Date of Birth/Sex: 1940/10/26 (76 y.o. Female) Treating RN: Carolyne Fiscal, Debi Primary Care Kaylianna Detert: Beverlyn Roux Other Clinician: Referring Magdala Brahmbhatt: Beverlyn Roux Treating Aeron Lheureux/Extender: Tito Dine in Treatment: 11 Visit Information History Since Last Visit All ordered tests and consults were completed: No Patient Arrived: Wheel Chair Added or deleted any medications: No Arrival Time: 10:22 Any new allergies or adverse reactions: No Accompanied By: husband Had a fall or experienced change in No Transfer Assistance: EasyPivot activities of daily living that may affect Patient Lift risk of falls: Patient Identification Verified: Yes Signs or symptoms of abuse/neglect since last No Secondary Verification Process Yes visito Completed: Hospitalized since last visit: No Patient Requires Transmission- No Has Dressing in Place as Prescribed: Yes Based Precautions: Pain Present Now: No Patient Has Alerts: No Electronic Signature(s) Signed: 04/09/2017 5:43:38 PM By: Alric Quan Entered By: Alric Quan on 04/09/2017 10:26:55 Diebel, Quentin V. ([id number]) -------------------------------------------------------------------------------- Encounter Discharge Information Details Patient Name: Silvester, Saul V. Date of Service: 04/09/2017 10:15 AM Medical Record Number: [id number] Patient Account Number: [id number] Date of Birth/Sex: 1941-06-11 (76 y.o. Female) Treating RN: Carolyne Fiscal, Debi Primary Care Twanna Resh: Beverlyn Roux Other Clinician: Referring Miku Udall: Beverlyn Roux Treating Bethsaida Siegenthaler/Extender: Tito Dine in Treatment: 22 Encounter Discharge Information Items Discharge Pain Level: 0 Discharge Condition: Stable Ambulatory Status:  Wheelchair Discharge Destination: Home Transportation: Private Auto Accompanied By: husband Schedule Follow-up Appointment: Yes Medication Reconciliation completed No and provided to Patient/Care Markasia Carrol: Provided on Clinical Summary of Care: 04/09/2017 Form Type Recipient Paper Patient [facility] Electronic Signature(s) Signed: 04/10/2017 9:07:34 AM By: Ruthine Dose Entered By: Ruthine Dose on 04/09/2017 10:54:13 Kidd, Tanvi V. ([id number]) -------------------------------------------------------------------------------- Lower Extremity Assessment Details Patient Name: Soohoo, Kaysen V. Date of Service: 04/09/2017 10:15 AM Medical Record Number: [id number] Patient Account Number: [id number] Date of Birth/Sex: 1940-09-07 (76 y.o. Female) Treating RN: Carolyne Fiscal, Debi Primary Care Katee Wentland: Beverlyn Roux Other Clinician: Referring Tate Jerkins: Beverlyn Roux Treating Belissa Kooy/Extender: Tito Dine in Treatment: 22 Vascular Assessment Pulses: Dorsalis Pedis Palpable: [Right:No] Doppler Audible: [Right:Yes] Posterior Tibial Extremity colors, hair growth, and conditions: Extremity Color: [Right:Hyperpigmented] Temperature of Extremity: [Right:Warm] Capillary Refill: [Right:> 3 seconds] Toe Nail Assessment Left: Right: Thick: Yes Discolored: Yes Deformed: Yes Improper Length and Hygiene: Yes Electronic Signature(s) Signed: 04/09/2017 5:43:38 PM By: Alric Quan Entered By: Alric Quan on 04/09/2017 10:35:49 Langhans, Revella V. ([id number]) -------------------------------------------------------------------------------- Multi Wound Chart Details Patient Name: Dahlem, Shamaria V. Date of Service: 04/09/2017 10:15 AM Medical Record Number: [id number] Patient Account Number: [id number] Date of Birth/Sex: 23-May-1941 (76 y.o. 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] Comorbid History: [7:Cataracts, Chronic sinus N/A problems/congestion, Congestive Heart Failure, Hypertension, Type II Diabetes] Date Acquired: [7:08/06/2013] [N/A:N/A] Weeks of Treatment: [7:22] [N/A:N/A] Wound Status: [7:Open] [N/A:N/A] Measurements L x W x D 1.8x0.5x0.3 [N/A:N/A] (cm) Area (cm) : [7:0.707] [N/A:N/A] Volume (cm) : [7:0.212] [N/A:N/A] % Reduction in Area: [7:40.00%] [N/A:N/A] % Reduction in Volume: 39.90% [N/A:N/A] Classification: [7:Grade 2] [N/A:N/A] Exudate Amount: [  7:Large] [N/A:N/A] Exudate Type: [7:Serous] [N/A:N/A] Exudate Color: [7:amber] [N/A:N/A] Wound Margin: [7:Flat and Intact] [N/A:N/A] Granulation Amount: [7:Medium (34-66%)] [N/A:N/A] Granulation Quality: [7:Pink, Friable] [N/A:N/A] Necrotic Amount: [7:Medium (34-66%)] [N/A:N/A] Exposed Structures: [7:Fat Layer (Subcutaneous N/A Tissue) Exposed: Yes] Muscle: Yes Fascia: No Tendon: No Joint: No Bone: No Epithelialization: Small (1-33%) N/A N/A Debridement: Debridement (50932- N/A N/A 11047) Pre-procedure 10:37 N/A N/A Verification/Time Out Taken: Pain Control: Lidocaine 4% Topical N/A N/A Solution Tissue Debrided: Fibrin/Slough, Exudates, N/A N/A Subcutaneous Level: Skin/Subcutaneous N/A N/A Tissue Debridement Area (sq 0.9 N/A N/A cm): Instrument: Curette N/A N/A Bleeding: Minimum N/A N/A Hemostasis Achieved: Pressure N/A N/A Procedural Pain: 0 N/A N/A Post Procedural Pain: 0 N/A N/A Debridement Treatment Procedure was tolerated N/A N/A Response: well Post  Debridement 1.8x0.5x0.4 N/A N/A Measurements L x W x D (cm) Post Debridement 0.283 N/A N/A Volume: (cm) Periwound Skin Texture: Induration: Yes N/A N/A Scarring: Yes Periwound Skin Maceration: No N/A N/A Moisture: Periwound Skin Color: Ecchymosis: Yes N/A N/A Hemosiderin Staining: Yes Mottled: Yes Temperature: No Abnormality N/A N/A Tenderness on Yes N/A N/A Palpation: Wound Preparation: Ulcer Cleansing: N/A N/A Rinsed/Irrigated with Saline, Other: soap and water Topical Anesthetic Applied: Other: lidociane 4% Procedures Performed: Debridement N/A N/A Slemmer, Yetzali V. ([id number]) Treatment Notes Wound #7 (Right, Dorsal Metatarsal head first) 1. Cleansed with: Clean wound with Normal Saline 2. Anesthetic Topical Lidocaine 4% cream to wound bed prior to debridement 4. Dressing Applied: Other dressing (specify in notes) 5. Secondary Dressing Applied Dry Gauze Foam Kerlix/Conform 7. Secured with Tape Notes endoform Electronic Signature(s) Signed: 04/09/2017 5:15:21 PM By: Linton Ham MD Entered By: Linton Ham on 04/09/2017 10:49:39 Allard, Giulia [name] ([id number]) -------------------------------------------------------------------------------- Multi-Disciplinary Care Plan Details Patient Name: Perkin, Ellanor V. Date of Service: 04/09/2017 10:15 AM Medical Record Number: [id number] Patient Account Number: [id number] Date of Birth/Sex: September 24, 1940 (76 y.o. Female) Treating RN: Carolyne Fiscal, Debi Primary Care Tracie Lindbloom: Beverlyn Roux Other Clinician: Referring Reya Aurich: Beverlyn Roux Treating Courtland Reas/Extender: Tito Dine in Treatment: 22 Active Inactive ` Abuse / Safety / Falls / Self Care Management Nursing Diagnoses: Impaired physical mobility Potential for falls Goals: Patient will remain injury free Date Initiated: 11/06/2016 Target Resolution Date: 12/30/2016 Goal Status: Active Patient/caregiver will verbalize understanding of skin care regimen Date  Initiated: 11/06/2016 Target Resolution Date: 12/30/2016 Goal Status: Active Interventions: Assess fall risk on admission and as needed Treatment Activities: Patient referred to home care : 11/06/2016 Notes: ` Nutrition Nursing Diagnoses: Potential for alteratiion in Nutrition/Potential for imbalanced nutrition Goals: Patient/caregiver verbalizes understanding of need to maintain therapeutic glucose control per primary care physician Date Initiated: 11/06/2016 Target Resolution Date: 12/30/2016 Goal Status: Active Interventions: Provide education on elevated blood sugars and impact on wound healing Helling, Omelia V. ([id number]) Notes: ` Orientation to the Wound Care Program Nursing Diagnoses: Knowledge deficit related to the wound healing center program Goals: Patient/caregiver will verbalize understanding of the [place] Date Initiated: 11/06/2016 Target Resolution Date: 12/30/2016 Goal Status: Active Interventions: Provide education on orientation to the wound center Notes: ` Venous Leg Ulcer Nursing Diagnoses: Actual venous Insuffiency (use after diagnosis is confirmed) Knowledge deficit related to disease process and management Goals: Non-invasive venous studies are completed as ordered Date Initiated: 11/06/2016 Target Resolution Date: 12/30/2016 Goal Status: Active Patient/caregiver will verbalize understanding of disease process and disease management Date Initiated: 11/06/2016 Target Resolution Date: 12/30/2016 Goal Status: Active Interventions: Assess peripheral edema status every visit. Notes: ` Wound/Skin Impairment Nursing Diagnoses: Impaired tissue integrity Knowledge deficit related to smoking  impact on wound healing Knowledge deficit related to ulceration/compromised skin integrity Goals: Rafalski, Amaia V. ([id number]) Ulcer/skin breakdown will heal within 14 weeks Date Initiated: 11/06/2016 Target Resolution Date: 02/05/2017 Goal Status:  Active Interventions: Assess ulceration(s) every visit Treatment Activities: Skin care regimen initiated : 11/06/2016 Notes: Electronic Signature(s) Signed: 04/09/2017 5:43:38 PM By: Alric Quan Entered By: Alric Quan on 04/09/2017 10:37:54 Wigen, Laquida V. ([id number]) -------------------------------------------------------------------------------- Pain Assessment Details Patient Name: Graves, Liller V. Date of Service: 04/09/2017 10:15 AM Medical Record Number: [id number] Patient Account Number: [id number] Date of Birth/Sex: 1940-08-29 (76 y.o. Female) Treating RN: Carolyne Fiscal, Debi Primary Care Prudencio Velazco: Beverlyn Roux Other Clinician: Referring Lerline Valdivia: Beverlyn Roux Treating Kalianna Verbeke/Extender: Tito Dine in Treatment: 22 Active Problems Location of Pain Severity and Description of Pain Patient Has Paino No Site Locations Pain Management and Medication Current Pain Management: Electronic Signature(s) Signed: 04/09/2017 5:43:38 PM By: Alric Quan Entered By: Alric Quan on 04/09/2017 10:27:05 Gaw, Naliyah [name] ([id number]) -------------------------------------------------------------------------------- Patient/Caregiver Education Details Patient Name: Saxer, Dhyana V. Date of Service: 04/09/2017 10:15 AM Medical Record Patient Account Number: [id number] Number: Treating RN: Ahmed Prima [birth year] (76 y.o. Other Clinician: Date of Birth/Gender: Female) Treating ROBSON, [name] Primary Care Physician: Beverlyn Roux Physician/Extender: G Referring Physician: Dorthula Nettles in Treatment: 22 Education Assessment Education Provided To: Patient Education Topics Provided Wound/Skin Impairment: Handouts: Other: change dressing as ordered Methods: Demonstration, Explain/Verbal Responses: State content correctly Electronic Signature(s) Signed: 04/09/2017 5:43:38 PM By: Alric Quan Entered By: Alric Quan on 04/09/2017 10:39:24 Poch,  Elleah V. ([id number]) -------------------------------------------------------------------------------- Wound Assessment Details Patient Name: Posner, Toyoko V. Date of Service: 04/09/2017 10:15 AM Medical Record Number: [id number] Patient Account Number: [id number] Date of Birth/Sex: 1941/05/30 (76 y.o. Female) Treating RN: Carolyne Fiscal, Debi Primary Care Bravlio Luca: Beverlyn Roux Other Clinician: Referring Aneli Zara: Beverlyn Roux Treating Erich Kochan/Extender: Tito Dine in Treatment: 22 Wound Status Wound Number: 7 Primary Diabetic Wound/Ulcer of the Lower Etiology: Extremity Wound Location: Right Metatarsal head first - Dorsal Wound Open Status: Wounding Event: Gradually Appeared Comorbid Cataracts, Chronic sinus Date Acquired: 08/06/2013 History: problems/congestion, Congestive Heart Weeks Of Treatment: 22 Failure, Hypertension, Type II Diabetes Clustered Wound: No Photos Photo Uploaded By: Gretta Cool, BSN, RN, CWS, Kim on 04/09/2017 16:47:17 Wound Measurements Length: (cm) 1.8 Width: (cm) 0.5 Depth: (cm) 0.3 Area: (cm) 0.707 Volume: (cm) 0.212 % Reduction in Area: 40% % Reduction in Volume: 39.9% Epithelialization: Small (1-33%) Tunneling: No Undermining: No Wound Description Classification: Grade 2 Wound Margin: Flat and Intact Exudate Amount: Large Exudate Type: Serous Exudate Color: amber Foul Odor After Cleansing: No Slough/Fibrino Yes Wound Bed Granulation Amount: Medium (34-66%) Exposed Structure Granulation Quality: Pink, Friable Fascia Exposed: No Necrotic Amount: Medium (34-66%) Fat Layer (Subcutaneous Tissue) Exposed: Yes Scarfo, Emrey V. ([id number]) Necrotic Quality: Adherent Slough Tendon Exposed: No Muscle Exposed: Yes Necrosis of Muscle: No Joint Exposed: No Bone Exposed: No Periwound Skin Texture Texture Color No Abnormalities Noted: No No Abnormalities Noted: No Induration: Yes Ecchymosis: Yes Scarring: Yes Hemosiderin Staining:  Yes Mottled: Yes Moisture No Abnormalities Noted: No Temperature / Pain Maceration: No Temperature: No Abnormality Tenderness on Palpation: Yes Wound Preparation Ulcer Cleansing: Rinsed/Irrigated with Saline, Other: soap and water, Topical Anesthetic Applied: Other: lidociane 4%, Treatment Notes Wound #7 (Right, Dorsal Metatarsal head first) 1. Cleansed with: Clean wound with Normal Saline 2. Anesthetic Topical Lidocaine 4% cream to wound bed prior to debridement 4. Dressing Applied: Other dressing (specify in notes) 5. Secondary Dressing Applied Dry Gauze Foam Kerlix/Conform 7. Secured with Tape Notes endoform  Electronic Signature(s) Signed: 04/09/2017 5:43:38 PM By: Alric Quan Entered By: Alric Quan on 04/09/2017 10:34:50 Raineri, Raizy V. ([id number]) -------------------------------------------------------------------------------- Vitals Details Patient Name: Marrow, Jaiya V. Date of Service: 04/09/2017 10:15 AM Medical Record Number: [id number] Patient Account Number: [id number] Date of Birth/Sex: Nov 11, 1940 (76 y.o. Female) Treating RN: Carolyne Fiscal, Debi Primary Care Lanorris Kalisz: Beverlyn Roux Other Clinician: Referring Pranav Lince: Beverlyn Roux Treating Derrious Bologna/Extender: Tito Dine in Treatment: 22 Vital Signs Time Taken: 10:27 Temperature (F): 97.8 Height (in): 64 Pulse (bpm): 78 Weight (lbs): 200 Respiratory Rate (breaths/min): 16 Body Mass Index (BMI): 34.3 Blood Pressure (mmHg): 139/54 Reference Range: 80 - 120 mg / dl Electronic Signature(s) Signed: 04/09/2017 5:43:38 PM By: Alric Quan Entered By: Alric Quan on 04/09/2017 10:29:03

## 2017-04-16 ENCOUNTER — Encounter: Payer: Medicare HMO | Admitting: Internal Medicine

## 2017-04-16 DIAGNOSIS — E11622 Type 2 diabetes mellitus with other skin ulcer: Secondary | ICD-10-CM | POA: Diagnosis not present

## 2017-04-16 NOTE — Progress Notes (Deleted)
Established Bypass  History of Present Illness  [name] is a 76 y.o. (1941/07/30) female who presents for postoperative follow-up for:   1. Right below-the-knee popliteal artery to peroneal artery bypass with reversed greater saphenous vein  2. Endarterectomy of mid-segment peroneal artery(Date: 02/28/16).   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   ***The patient's husband thinks the right leg had near healed prior to the wound recently reopening.  The patient is getting wound care in [place].    The patient's neurologic deficits reportedly are better but she still has weakness in R side with inability to walk and continued problems with word findings.    PMH, PSH, Ambia, SH are all unchanged since 10/25/16  Current Outpatient Prescriptions  Medication Sig Dispense Refill  . acetaminophen (TYLENOL) 325 MG tablet Take 2 tablets (650 mg total) by mouth every 6 (six) hours as needed for moderate pain or fever. 30 tablet 1  . albuterol (PROVENTIL HFA;VENTOLIN HFA) 108 (90 Base) MCG/ACT inhaler Inhale 2 puffs into the lungs every 6 (six) hours as needed.    [name] amLODipine (NORVASC) 10 MG tablet Take 1 tablet (10 mg total) by mouth daily. 30 tablet 3  . aspirin 325 MG tablet Take 1 tablet (325 mg total) by mouth daily with breakfast. (Patient taking differently: Take 81 mg by mouth daily with breakfast. ) 30 tablet 0  . atorvastatin (LIPITOR) 20 MG tablet Take 1 tablet (20 mg total) by mouth daily. 30 tablet 11  . carvedilol (COREG) 12.5 MG tablet Take 12.5 mg by mouth 2 (two) times daily with a meal.    . cetirizine (ZYRTEC) 10 MG tablet Take 10 mg by mouth daily.     . cholecalciferol (VITAMIN D) 1000 units tablet Take 1,000 Units by mouth daily.    . clopidogrel (PLAVIX) 75 MG tablet Take 1 tablet (75 mg total) by mouth daily with  breakfast. 30 tablet 0  . collagenase (SANTYL) ointment Apply topically 2 (two) times daily. Apply to dorsum right foot wound daily. 15 g 0  . docusate sodium (COLACE) 100 MG capsule Take 100 mg by mouth 2 (two) times daily.    . fluticasone (FLONASE) 50 MCG/ACT nasal spray Place 1 spray into both nostrils daily.    . furosemide (LASIX) 40 MG tablet Take 1 tablet (40 mg total) by mouth 2 (two) times daily. 30 tablet 1  . insulin aspart (NOVOLOG) 100 UNIT/ML injection Inject 0-15 Units into the skin 3 (three) times daily with meals. Before each meal 3 times a day, 140-199 - 3 units, 200-250 - 5 units, 251-299 - 8 units,  300-349 - 12 units,  350 or above 15 units. (Patient taking differently: Inject 12 Units into the skin 3 (three) times daily with meals. ) 10 mL 11  . insulin glargine (LANTUS) 100 UNIT/ML injection Inject 0.27 mLs (27 Units total) into the skin at bedtime. 10 mL 11  . isosorbide mononitrate (IMDUR) 60 MG 24 hr tablet Take 1 tablet (60 mg total) by mouth daily. 30 tablet 0  . losartan (COZAAR) 100 MG tablet Take 100 mg by mouth daily.    . metoprolol (LOPRESSOR) 50 MG tablet Take 50 mg by mouth 2 (two) times daily.    . metoprolol succinate (TOPROL-XL) 50 MG 24 hr tablet Take 50 mg by mouth  daily. Take with or immediately following a meal.    . nitroGLYCERIN (NITROSTAT) 0.4 MG SL tablet Place 1 tablet (0.4 mg total) under the tongue every 5 (five) minutes as needed for chest pain. 3 tablet 12  . omeprazole (PRILOSEC) 20 MG capsule Take 20 mg by mouth daily.    . polyethylene glycol (MIRALAX / GLYCOLAX) packet Take 17 g by mouth 2 (two) times daily. (Patient taking differently: Take 17 g by mouth daily as needed for mild constipation. ) 14 each 0  . potassium chloride SA (K-DUR,KLOR-CON) 20 MEQ tablet Take 20 mEq by mouth daily.    . pregabalin (LYRICA) 50 MG capsule Take 50 mg by mouth 3 (three) times daily.    . ranolazine (RANEXA) 500 MG 12 hr tablet Take 1 tablet (500 mg total) by  mouth 2 (two) times daily. 60 tablet 0  . VITAMIN E PO Take 1 capsule by mouth daily.     No current facility-administered medications for this visit.     On ROS: ***, ***   Physical Examination ***There were no vitals filed for this visit. ***There is no height or weight on file to calculate BMI.   General: Somulent, Confused, WD, Ill appearing  Pulmonary: Sym exp, good B air movt, CTA B  Cardiac: RRR, Nl S1, S2, no Murmurs, No rubs, No S3,S4  Vascular: Vessel Right Left  Radial Faintly palpable Faintly palpable  Brachial Palpable Palpable  Carotid Palpable Palpable  Aorta Not palpable due to pannus N/A  Femoral Not palpable in wheelchair Not palpable in wheelchair  Popliteal Not palpable Not palpable  PT Not palpable Not palpable  DP Not palpable Not palpable   Gastrointestinal: soft, non-distended, non-tender to palpation, No guarding or rebound, no HSM, no masses, no CVAT B, No palpable prominent aortic pulse,    Musculoskeletal: in wheelchair, limited exam due to somulence. R foot with ischemic appearing 3rd and 1st toe tips, R 1st metatarsal with some serous drainage, BLE edema 2+  Neurologic: Pain and light touch decreased in both feet, limited exam due to decreased level of consciousness  , Motor exam as listed abov  Non-invasive Vascular Imaging   ABI (***)  R:   ABI: *** (0.48)  PT: {Signals:19197::"none","mono","bi","tri"}  DP: {Signals:19197::"none","mono","bi","tri"}  TBI:  ***  L:   ABI: *** (0.63),   PT: {Signals:19197::"none","mono","bi","tri"}  DP: {Signals:19197::"none","mono","bi","tri"}  TBI: ***   Medical Decision Making  [name] is a 76 y.o. 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  ***   Adele Barthel, MD, FACS [facility] Office: [phone] Pager: [phone]

## 2017-04-17 NOTE — Progress Notes (Signed)
[name], [name] ([id number]) Visit Report for 04/16/2017 Debridement Details Patient Name: [name], [name] Date of Service: 04/16/2017 8:45 AM Medical Record Patient Account Number: [id number] Number: Treating RN: [name] 1940-08-18 (76 y.o. Other Clinician: Date of Birth/Sex: Female) Treating ROBSON, [name] Primary Care Provider: Beverlyn White Provider/Extender: G Referring Provider: Dorthula White in Treatment: 23 Debridement Performed for Wound #7 Right,Dorsal Metatarsal head first Assessment: Performed White: Physician [name] Dillon, MD Debridement: Debridement Severity of Tissue Pre Fat layer exposed Debridement: Pre-procedure Verification/Time Out Yes - 09:08 Taken: Start Time: 09:09 Pain Control: Lidocaine 4% Topical Solution Level: Skin/Subcutaneous Tissue Total Area Debrided (L x 2 (cm) x 0.5 (cm) = 1 (cm) W): Tissue and other Viable, Non-Viable, Exudate, Fibrin/Slough, Subcutaneous material debrided: Instrument: Curette Bleeding: Minimum Hemostasis Achieved: Pressure End Time: 09:11 Procedural Pain: 0 Post Procedural Pain: 0 Response to Treatment: Procedure was tolerated well Post Debridement Measurements of Total Wound Length: (cm) 2 Width: (cm) 0.5 Depth: (cm) 0.3 Volume: (cm) 0.236 Character of Wound/Ulcer Post Requires Further Debridement Debridement: Severity of Tissue Post Debridement: Fat layer exposed Post Procedure Diagnosis Same as Pre-procedure [name], [name] ([id number]) Electronic Signature(s) Signed: 04/16/2017 4:34:43 PM White: [name] Ham MD Signed: 04/16/2017 4:54:17 PM White: [name]: [name] on 04/16/2017 09:10:48 Cuthrell, [name] ([id number]) -------------------------------------------------------------------------------- HPI Details Patient Name: [name], [name] Date of Service: 04/16/2017 8:45 AM Medical Record Patient Account Number: [id number] Number: Treating RN:  [name] 22-Dec-1940 (76 y.o. Other Clinician: Date of Birth/Sex: Female) Treating [name] Primary Care Provider: Beverlyn White Provider/Extender: G Referring Provider: Dorthula White in Treatment: 23 History of Present Illness HPI Description: 08/13/16: This is a 76 year old woman who came predominantly for review of 3 cm in diameter circular wound to the left anterior lateral leg. She was in the ER on 08/01/16 I reviewed their notes. There was apparently pus coming out of the wound at that time and the patient arrived requesting debridement which they don't do in the emergency room. Nevertheless I can't see that they did any x-rays. There were no cultures done. She is a type II diabetic and I a note after the patient was in the clinic that she had a bypass graft from the popliteal to the tibial on the right on 02/28/16. She also had a right greater saphenous vein harvest on the same date for arterial bypass. She is going to have vascular studies including ABIs T ABIs on the right on 08/28/16. The patient's surgery was on 02/28/16 White Dr. Vallarie Mare she had a right below the knee popliteal artery to peroneal artery bypass with reverse greater saphenous vein and an endarterectomy of the mid segment peroneal artery. Postoperatively she had a strong mild monophasic peroneal signal with a pink foot. It would appear that the patient is had some nonhealing in the surgical saphenous vein harvest site on the left leg. Surprisingly [facility] I cannot see much information about this at all. Dr. Lucious Groves notes from 05/29/16 show that the patient's wounds "are not healed" the right first metatarsal wound healed but then opened back up. 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 She has had surgery on the metatarsal phalangeal in  2015 I Dr. Doran Durand secondary to a right metatarsal phalangeal joint fracture. She is apparently had discoloration around this area since then. 08/28/16; patient arrives with her wounds in much the same condition. The linear vein harvest site and the circular wound below it which I think was a blister. She also has to probing holes in her right great toe and a necrotic eschar on the right second toe. Because of these being arterial wounds I reduced her compression from 3-2 layers this seems to of done satisfactorily she has not had any problems. I cannot see that she is actually had an x-ray ====== 11/06/16 the patient comes in for evaluation of her right lower extremity ulcers. She was here in January 2018 for 2 visits subsequently ended up in the hospital with pneumonia and then to rehabilitation. She has now been discharged from rehabilitation and is home. She has multiple ulcerations to her right lower extremity including the foot and toes. She does have home health in place and they have been placing alginate to the ulcers. She is followed White Dr. Bridgett Larsson of vascular medicine. 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 She recently saw him on 3/23. In office ABIs were: Boozer, [name] NOYES. ([id number]) Right 0.48 with monophasic flow to the DP, PT, peroneal Left 0.63 with monophasic flow to the DP and PT Her arterial studies indicated a patent right below knee popliteal to peroneal bypass She had an MRI in February 2008 that was negative frosty myelitis but this showed general soft tissue edema in the right foot and lower extremity concerning for cellulitis She is a diabetic, managed with insulin. Her hemoglobin A1c in December 2017 was 8.4 which is a trend up from previous levels. She had blood work in February 2018  which revealed an albumin of 2.6 this appears to be relatively acute as an albumin in November 2017 was 3.7 11/13/16; this is a patient I have not seen since February who is readmitted to our clinic last week. She is a type II diabetic on insulin with known severe PAD status post revascularization in the left leg White Dr. Bridgett Larsson. I have reviewed Dr. Lianne Moris notes from March/23/18. Doppler ABI on that date showed an ABI on the right of 0.48 and on the left of 0.63. Dorsalis pedis waveforms were monophasic bilaterally. There was no waveforms detected at the posterior tibial on the right, monophasic on the left. Dr. Lianne Moris comments were that this patient would have follow-up vascular studies in 3 months including ABIs and right lower extremity arterial duplex. She had an MRI in February that was negative for osteomyelitis but showed generalized edema in the foot. Last albumin I see was in January at 3.4 we have been using Santyl to the 4 wounds on the right leg the patient is noted today to have widespread edema well up towards her groin this is pitting 2-3+. 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 Normal ejection fraction. 11/20/16; patient has a follow-up appointment with Dr. Bridgett Larsson on April 23. She is still complaining of a lot of pain in the right foot and right leg. It is not clear to me that this is at all positional however I think it is clear claudication with minimal activity perhaps at rest. At our suggestion she is return to her primary physician's office tomorrow with regards to her pitting lower extremity bilateral edema  that I reviewed in detail last week 11/27/16; the follow-up with Dr. Bridgett Larsson was actually on May 23 on April 23 as I stated in my note last week. N/A case all of her wounds seems somewhat smaller. The 2 on the right leg are definitely smaller. The areas on the dorsal right first toe, right third toe and the lateral part  of the right fifth metatarsal head all looks smaller but have tightly adherent surfaces. We have been using Santyl 12/04/16; follow-up with Dr. Bridgett Larsson on May 23. 2 small open wounds on the right leg continue to get smaller. The area on the right third total lateral aspect of the right fifth toe also look better. The remaining area on the dorsal first toe still has some depth to it. We have been using Santyl to the toes and collagen on the right 12/11/16; according to patient's husband the follow-up with Dr. Bridgett Larsson is not until July. All of the wounds on the right leg are measuring smaller. We have been using some combination of Prisma and Santyl although I think we can go to straight Prisma today. There may have been some confusion with home health about the primary dressing orders here. 12/25/16; the patient has had some healing this week. The area on her right lateral fifth metatarsal head, right third toe are both healed lower right leg is healed. In the vein harvest site superiorly she has one superficial open area. On the dorsal aspect of her right great toe/MTP joint the wound is now divided into 2 however the proximal area is deep and there is palpable bone 01/01/17; still an open area in the middle of her original right surgical scar. The area on the right third toe and right lateral fifth metatarsal head remained closed. Problematic area on the dorsal aspect of the toe. Previous surgery in this area line 01/08/17 small open area on the original scar on her upper anterior leg although this is closing. X-ray I did of the right first toe did not show underlying bony abnormality. Still this area on the dorsal first toe probes to bone. We have been using Prisma 01/15/17; small open area in the original scar in the upper anterior leg is almost fully closed. She has 2 open areas over the dorsal aspect of the right first toe that probes to bone. Used and a form starting last week. Vigorous bone scraping  that I did last week showed few methicillin sensitive staph aureus. She is allergic to Blomberg, Aruna V. ([id number]) penicillin and sulfa drugs. I'm going to give her 2 weeks of doxycycline. She will need an MRI with contrast. 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 She continues on doxycycline for a bone scraping that showed methicillin sensitive staph aureus [allergic to penicillin and sulfa]. We have been using Endo form to the wound. 01/29/17; surprisingly her MRI did not show osteomyelitis. She continues on doxycycline for a bone scraping that showed methicillin sensitive staph aureus with allergies to penicillin and sulfa. We have been using Endo form to the wound. Unfortunately I cannot get a surface on this visit looks like it is able to support a healing state. Proximally there is still exposed bone. There is no overt soft tissue tenderness. Her MRI did show a previous fracture was surgery to this area but no hardware 02/12/17 on evaluation today patient appears to be doing well in regard to  her lower extremity wound. She does have some mild discomfort but this is minimal. There's no evidence of infection. Her left great toe nail has somewhat been lifted up and there is a little bit of slight bleeding underneath but this is still firmly attached. 02/19/17; she ran out of doxycycline 2 days ago. Nevertheless I would like to continue this for 2 weeks to make a full 6 weeks of therapy as the bone scraping that I did from the open area on the dorsal right first toe showed MRSA. This should complete antibiotic therapy. 02/26/17; the patient is completing her 6 weeks of oral doxycycline. Deep wound on the dorsal right toe. This still has some exposed bone proximally. She does have a reasonably granulated surface albeit thin over bone. I've been using Endo form have made application for an amniotic skin sub 03/05/17; the  patient has completed 6 weeks of doxycycline. This is a deep wound on the dorsal right toe which has exposed bone proximally. She has granulation over most of this wound albeit a thin layer. I've been using Endo form. Still do not have approval for Affinity 03/13/17 on evaluation today patient's right foot wound appears to be doing better measurement wise compared to her last evaluation. She has been tolerating the dressing change without complication and does have a appointment with the dietary that has been made as far as referral is concerned there just waiting for her and transportation to contact them back for an actual date. Nonetheless patient has been having less pain at this point. No fevers, chills, nausea, or vomiting noted at this time. 03/26/17; linear wound over the dorsal aspect of her right great toe. At one point this had exposed bone on the most superior aspect however I am pleased to see today that this appears to have a surface of granulation. Apparently product was applied for but denied [facility] We'll need to see what that was. The patient has known PAD status post revascularization. MRI did not show osteomyelitis which was done in June 04/02/17; continued improvement using Endoform. She was approved for Oasis but hasn't over $425 co-pay per application, this is beyond her means 04/09/17; continues on Endoform. 04/16/17; appears to be doing nicely continuing on Endoform Electronic Signature(s) Signed: 04/16/2017 4:34:43 PM White: [name] Ham MD Entered White: [name] on 04/16/2017 09:29:26 [name], [name] [name] ([id number]) -------------------------------------------------------------------------------- Physical Exam Details Patient Name: Pamintuan, Zanai V. Date of Service: 04/16/2017 8:45 AM Medical Record Patient Account Number: [id number] Number: Treating RN: [name] 07-02-1941 (76 y.o. Other Clinician: Date of Birth/Sex: Female) Treating ROBSON,  [name] Primary Care Provider: Beverlyn White Provider/Extender: G Referring Provider: Dorthula White in Treatment: 23 Constitutional Sitting or standing Blood Pressure is within target range for patient.. Pulse regular and within target range for patient.[name] Respirations regular, non-labored and within target range.. Temperature is normal and within the target range for the patient.[name] appears in no distress. Cardiovascular Faint but palpable. Notes Woman exam; linear wound on the dorsal aspect of the great toe on the right. This continues to make nice improvement still nonviable tissue removed with a #3 curet hemostasis with direct pressure. Continues to have a superior orifice however this also appears to be Best boy) Signed: 04/16/2017 4:34:43 PM White: [name] Ham MD Entered White: [name] on 04/16/2017 09:30:39 Bonham, Io [name] ([id number]) -------------------------------------------------------------------------------- Physician Orders Details Patient Name: Nemitz, Donzella V. Date of Service: 04/16/2017 8:45 AM Medical Record Patient Account Number: [id number] Number: Treating RN:  Carolyne Fiscal, Debi 25-Apr-1941 [id number] y.o. Other Clinician: Date of Birth/Sex: Female) Treating [name] Primary Care Provider: Beverlyn White Provider/Extender: G Referring Provider: Dorthula White in Treatment: 32 Verbal / Phone Orders: Yes ClinicianCarolyne Fiscal, Debi Read Back and Verified: Yes Diagnosis Coding Wound Cleansing Wound #7 Right,Dorsal Metatarsal head first o Clean wound with Normal Saline. o Cleanse wound with mild soap and water Anesthetic Wound #7 Right,Dorsal Metatarsal head first o Topical Lidocaine 4% cream applied to wound bed prior to debridement Primary Wound Dressing Wound #7 Right,Dorsal Metatarsal head first o Other: - ENDOFORM Secondary Dressing Wound #7 Right,Dorsal Metatarsal head first o Conform/Kerlix o  Foam Dressing Change Frequency Wound #7 Right,Dorsal Metatarsal head first o Change Dressing Monday, Wednesday, Friday Follow-up Appointments Wound #7 Right,Dorsal Metatarsal head first o Return Appointment in 1 week. Edema Control Wound #7 Right,Dorsal Metatarsal head first o Elevate legs to the level of the heart and pump ankles as often as possible Additional Orders / Instructions Wound #7 Right,Dorsal Metatarsal head first o Increase protein intake. Kennebrew, CHAVIE KOLINSKI ([id number]) o Activity as tolerated Home Health Wound #7 Right,Dorsal Metatarsal head first o [facility] twice weekly, [facility]-Wednesday o Home Health Nurse may visit PRN to address patientos wound care needs. o FACE TO FACE ENCOUNTER: MEDICARE and MEDICAID PATIENTS: I certify that this patient is under my care and that I had a face-to-face encounter that meets the physician face-to-face encounter requirements with this patient on this date. The encounter with the patient was in whole or in part for the following MEDICAL CONDITION: (primary reason for Mikes) MEDICAL NECESSITY: I certify, that based on my findings, NURSING services are a medically necessary home health service. HOME BOUND STATUS: I certify that my clinical findings support that this patient is homebound (i.e., Due to illness or injury, pt requires aid of supportive devices such as crutches, cane, wheelchairs, walkers, the use of special transportation or the assistance of another person to leave their place of residence. There is a normal inability to leave the home and doing so requires considerable and taxing effort. Other absences are for medical reasons / religious services and are infrequent or of short duration when for other reasons). o If current dressing causes regression in wound condition, may D/C ordered dressing product/s and apply Normal Saline Moist Dressing daily until  next [place] / Other MD appointment. [place] of regression in wound condition at [phone]. o Please direct any NON-WOUND related issues/requests for orders to patient's Primary Care Physician Electronic Signature(s) Signed: 04/16/2017 4:34:43 PM White: [name] Ham MD Signed: 04/16/2017 4:54:17 PM White: [name]: [name] on 04/16/2017 09:11:05 Gutierrez, Kaia V. ([id number]) -------------------------------------------------------------------------------- Problem List Details Patient Name: Doffing, Silva V. Date of Service: 04/16/2017 8:45 AM Medical Record Patient Account Number: [id number] Number: Treating RN: [name] February 19, 1941 (76 y.o. 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 Date of Service: 04/16/2017 8:45 AM Medical Record Patient Account Number: [id number] Number: Treating RN: [name] 09/30/1940 (76 y.o. Other Clinician: Date of Birth/Sex: Female) Treating [name] Primary Care Provider: Beverlyn White Provider/Extender: G Referring Provider: Dorthula White in Treatment: 23 Subjective History of Present Illness (HPI) 08/13/16: This is a 76 year old woman who came predominantly for review of 3 cm in diameter circular wound to the left anterior lateral leg. She was in the ER on 08/01/16 I reviewed their notes. There was apparently pus coming out of the wound at that time and the patient arrived requesting debridement which they don't do in the emergency room. Nevertheless I can't see that they did any x-rays. There were no cultures done. She is a type II diabetic and I a note after the patient was in the clinic that she had a bypass graft from the popliteal to the tibial on the right on 02/28/16. She also had a right greater saphenous vein harvest on the same date for arterial bypass. She is going to have vascular studies including ABIs T ABIs on the right on 08/28/16. The patient's surgery was on 02/28/16 White Dr. Vallarie Mare she had a right below the knee popliteal artery to peroneal artery bypass with reverse greater saphenous vein and an endarterectomy of the mid segment peroneal artery. Postoperatively she had a strong mild monophasic peroneal signal with a pink foot. It would appear that the patient is had some nonhealing in the surgical saphenous vein harvest site on the left leg. Surprisingly looking [facility] I cannot see much information about this at all. Dr. Lucious Groves notes from 05/29/16 show that the patient's wounds "are not healed" the right first metatarsal wound healed but then opened back up. The patient's postoperative course was complicated White a CVA with near total occlusion of  her left internal carotid artery that required stenting. At that point the patient had a wound VAC to her right calf with regards to the wounds on her dorsal right toe would appear that these are felt to be arterial wounds. She has had surgery on the metatarsal phalangeal in 2015 I Dr. Doran Durand secondary to a right metatarsal phalangeal joint fracture. She is apparently had discoloration around this area since then. 08/28/16; patient arrives with her wounds in much the same condition. The linear vein harvest site and the circular wound below it which I think was a blister. She also has to probing holes in her right great toe and a necrotic eschar on the right second toe. Because of these being arterial wounds I reduced her compression from 3-2 layers this seems to of done satisfactorily she has not had any problems. I cannot see that she is actually had an x-ray ====== 11/06/16 the patient comes in for evaluation of her right lower extremity ulcers. She was here in January 2018 for 2 visits subsequently ended up in the hospital with pneumonia and then to rehabilitation. She has now been discharged from rehabilitation and is home. She has multiple ulcerations to her right lower extremity including the foot and toes. She does have home health in place and they have  been placing alginate to the ulcers. She is followed White Dr. Bridgett Larsson of vascular medicine. 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 She recently saw him on 3/23. In office ABIs were: [name], [name] HORNBAKER. ([id number]) Right 0.48 with monophasic flow to the DP, PT, peroneal Left 0.63 with monophasic flow to the DP and PT Her arterial studies indicated a patent right below knee popliteal to peroneal bypass She had an MRI in February 2008 that was negative frosty myelitis but this showed general soft tissue edema in the right foot and lower extremity concerning for cellulitis She  is a diabetic, managed with insulin. Her hemoglobin A1c in December 2017 was 8.4 which is a trend up from previous levels. She had blood work in February 2018 which revealed an albumin of 2.6 this appears to be relatively acute as an albumin in November 2017 was 3.7 11/13/16; this is a patient I have not seen since February who is readmitted to our clinic last week. She is a type II diabetic on insulin with known severe PAD status post revascularization in the left leg White Dr. Bridgett Larsson. I have reviewed Dr. Lianne Moris notes from March/23/18. Doppler ABI on that date showed an ABI on the right of 0.48 and on the left of 0.63. Dorsalis pedis waveforms were monophasic bilaterally. There was no waveforms detected at the posterior tibial on the right, monophasic on the left. Dr. Lianne Moris comments were that this patient would have follow-up vascular studies in 3 months including ABIs and right lower extremity arterial duplex. She had an MRI in February that was negative for osteomyelitis but showed generalized edema in the foot. Last albumin I see was in January at 3.4 we have been using Santyl to the 4 wounds on the right leg the patient is noted today to have widespread edema well up towards her groin this is pitting 2-3+. 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 Normal ejection fraction. 11/20/16; patient has a follow-up appointment with Dr. Bridgett Larsson on April 23. She is still complaining of a lot of pain in the right foot and right leg. It is not clear to me that this is at all positional however I think it is clear claudication with minimal activity perhaps at rest. At our suggestion she is return to her primary physician's office tomorrow with regards to her pitting lower extremity bilateral edema that I reviewed in detail last week 11/27/16; the follow-up with Dr. Bridgett Larsson was actually on May 23 on April 23 as I stated in my note last week. N/A case all of  her wounds seems somewhat smaller. The 2 on the right leg are definitely smaller. The areas on the dorsal right first toe, right third toe and the lateral part of the right fifth metatarsal head all looks smaller but have tightly adherent surfaces. We have been using Santyl 12/04/16; follow-up with Dr. Bridgett Larsson on May 23. 2 small open wounds on the right leg continue to get smaller. The area on the right third total lateral aspect of the right fifth toe also look better. The remaining area on the dorsal first toe still has some depth to it. We have been using Santyl to the toes and collagen on the right 12/11/16; according to patient's husband the follow-up with Dr. Bridgett Larsson is not until July. All of the wounds on the right leg are measuring smaller. We have been  using some combination of Prisma and Santyl although I think we can go to straight Prisma today. There may have been some confusion with home health about the primary dressing orders here. 12/25/16; the patient has had some healing this week. The area on her right lateral fifth metatarsal head, right third toe are both healed lower right leg is healed. In the vein harvest site superiorly she has one superficial open area. On the dorsal aspect of her right great toe/MTP joint the wound is now divided into 2 however the proximal area is deep and there is palpable bone 01/01/17; still an open area in the middle of her original right surgical scar. The area on the right third toe and right lateral fifth metatarsal head remained closed. Problematic area on the dorsal aspect of the toe. Previous surgery in this area line 01/08/17 small open area on the original scar on her upper anterior leg although this is closing. X-ray I did of the right first toe did not show underlying bony abnormality. Still this area on the dorsal first toe probes to bone. We have been using Prisma 01/15/17; small open area in the original scar in the upper anterior leg is almost  fully closed. She has 2 open areas over the dorsal aspect of the right first toe that probes to bone. Used and a form starting last week. Vigorous bone scraping that I did last week showed few methicillin sensitive staph aureus. She is allergic to [name], [name] ([id number]) penicillin and sulfa drugs. I'm going to give her 2 weeks of doxycycline. She will need an MRI with contrast. 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 She continues on doxycycline for a bone scraping that showed methicillin sensitive staph aureus [allergic to penicillin and sulfa]. We have been using Endo form to the wound. 01/29/17; surprisingly her MRI did not show osteomyelitis. She continues on doxycycline for a bone scraping that showed methicillin sensitive staph aureus with allergies to penicillin and sulfa. We have been using Endo form to the wound. Unfortunately I cannot get a surface on this visit looks like it is able to support a healing state. Proximally there is still exposed bone. There is no overt soft tissue tenderness. Her MRI did show a previous fracture was surgery to this area but no hardware 02/12/17 on evaluation today patient appears to be doing well in regard to her lower extremity wound. She does have some mild discomfort but this is minimal. There's no evidence of infection. Her left great toe nail has somewhat been lifted up and there is a little bit of slight bleeding underneath but this is still firmly attached. 02/19/17; she ran out of doxycycline 2 days ago. Nevertheless I would like to continue this for 2 weeks to make a full 6 weeks of therapy as the bone scraping that I did from the open area on the dorsal right first toe showed MRSA. This should complete antibiotic therapy. 02/26/17; the patient is completing her 6 weeks of oral doxycycline. Deep wound on the dorsal right toe. This still has some exposed bone  proximally. She does have a reasonably granulated surface albeit thin over bone. I've been using Endo form have made application for an amniotic skin sub 03/05/17; the patient has completed 6 weeks of doxycycline. This is a deep wound on the dorsal right toe which has exposed bone proximally. She has granulation over  most of this wound albeit a thin layer. I've been using Endo form. Still do not have approval for [facility] 03/13/17 on evaluation today patient's right foot wound appears to be doing better measurement wise compared to her last evaluation. She has been tolerating the dressing change without complication and does have a appointment with the dietary that has been made as far as referral is concerned there just waiting for her and transportation to contact them back for an actual date. Nonetheless patient has been having less pain at this point. No fevers, chills, nausea, or vomiting noted at this time. 03/26/17; linear wound over the dorsal aspect of her right great toe. At one point this had exposed bone on the most superior aspect however I am pleased to see today that this appears to have a surface of granulation. Apparently product was applied for but denied [facility]. We'll need to see what that was. The patient has known PAD status post revascularization. MRI did not show osteomyelitis which was done in June 04/02/17; continued improvement using Endoform. She was approved for [facility] but hasn't over $470 co-pay per application, this is beyond her means 04/09/17; continues on Endoform. 04/16/17; appears to be doing nicely continuing on Endoform Objective Constitutional Sitting or standing Blood Pressure is within target range for patient.. Pulse regular and within target range [name], [name] ([id number]) for patient.[name] Respirations regular, non-labored and within target range.. Temperature is normal and within the target range for the patient.[name] appears in no distress. Vitals Time Taken:  8:53 AM, Height: 64 in, Weight: 200 lbs, BMI: 34.3, Temperature: 98.2 F, Pulse: 77 bpm, Respiratory Rate: 16 breaths/min, Blood Pressure: 152/53 mmHg. Cardiovascular Faint but palpable. General Notes: Woman exam; linear wound on the dorsal aspect of the great toe on the right. This continues to make nice improvement still nonviable tissue removed with a #3 curet hemostasis with direct pressure. Continues to have a superior orifice however this also appears to be contracting Integumentary (Hair, Skin) Wound #7 status is Open. Original cause of wound was Gradually Appeared. The wound is located on the Right,Dorsal Metatarsal head first. The wound measures 2cm length x 0.5cm width x 0.3cm depth; 0.785cm^2 area and 0.236cm^3 volume. There is muscle and Fat Layer (Subcutaneous Tissue) Exposed exposed. There is no tunneling or undermining noted. There is a large amount of serous drainage noted. The wound margin is flat and intact. There is medium (34-66%) pink, friable granulation within the wound bed. There is a medium (34-66%) amount of necrotic tissue within the wound bed including Adherent Slough. The periwound skin appearance exhibited: Induration, Scarring, Ecchymosis, Hemosiderin Staining, Mottled. The periwound skin appearance did not exhibit: Maceration. Periwound temperature was noted as No Abnormality. The periwound has tenderness on palpation. 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 ([id number]) Pre-procedure diagnosis of Wound #7 is a Diabetic Wound/Ulcer of the Lower Extremity located on the Right,Dorsal Metatarsal head first .Severity of Tissue Pre Debridement is: Fat layer exposed. There was a  Skin/Subcutaneous Tissue Debridement ([id number]) debridement with total area of 1 sq cm performed White [name] Dillon, MD. with the following instrument(s): Curette to remove Viable and Non-Viable tissue/material including Exudate, Fibrin/Slough, and Subcutaneous after achieving pain control using Lidocaine 4% Topical Solution. A time out was conducted at 09:08, prior to the start of the procedure. A Minimum amount of bleeding was controlled with Pressure. The procedure was tolerated well with a pain level of 0 throughout and a pain level of 0 following the procedure. Post Debridement Measurements: 2cm length x 0.5cm width x 0.3cm depth; 0.236cm^3 volume. Character of Wound/Ulcer Post Debridement requires further debridement. Severity of Tissue Post Debridement is: Fat layer exposed. Post procedure Diagnosis Wound #7: Same as Pre-Procedure Plan Wound Cleansing: Wound #7 Right,Dorsal Metatarsal head first: Clean wound with Normal Saline. Cleanse wound with mild soap and water Anesthetic: Wound #7 Right,Dorsal Metatarsal head first: Topical Lidocaine 4% cream applied to wound bed prior to debridement Primary Wound Dressing: Wound #7 Right,Dorsal Metatarsal head first: Other: - ENDOFORM Secondary Dressing: Wound #7 Right,Dorsal Metatarsal head first: Conform/Kerlix Foam Dressing Change Frequency: Wound #7 Right,Dorsal Metatarsal head first: Change Dressing Monday, Wednesday, Friday Follow-up Appointments: Wound #7 Right,Dorsal Metatarsal head first: Return Appointment in 1 week. Edema Control: Wound #7 Right,Dorsal Metatarsal head first: Elevate legs to the level of the heart and pump ankles as often as  possible Additional Orders / Instructions: Wound #7 Right,Dorsal Metatarsal head first: Increase protein intake. Activity as tolerated Home Health: Wound #7 Right,Dorsal Metatarsal head first: [place] [facility] [name], [name] ([id number]) Home Health Nurse may visit PRN to address patient s wound care needs. FACE TO FACE ENCOUNTER: MEDICARE and MEDICAID PATIENTS: I certify that this patient is under my care and that I had a face-to-face encounter that meets the physician face-to-face encounter requirements with this patient on this date. The encounter with the patient was in whole or in part for the following MEDICAL CONDITION: (primary reason for [place]) MEDICAL NECESSITY: I certify, that based on my findings, NURSING services are a medically necessary home health service. HOME BOUND STATUS: I certify that my clinical findings support that this patient is homebound (i.e., Due to illness or injury, pt requires aid of supportive devices such as crutches, cane, wheelchairs, walkers, the use of special transportation or the assistance of another person to leave their place of residence. There is a normal inability to leave the home and doing so requires considerable and taxing effort. Other absences are for medical reasons / religious services and are infrequent or of short duration when for other reasons). If current dressing causes regression in wound condition, may D/C ordered dressing product/s and apply Normal Saline Moist Dressing daily until next [place] / Other MD appointment. Stokes of regression in wound condition at [phone]. Please direct any NON-WOUND related issues/requests for orders to patient's Primary Care Physician #1 continue with the Endoform,conform kerlix Electronic Signature(s) Signed: 04/16/2017 4:34:43 PM White: [name] Ham MD Entered White: [name] on  04/16/2017 09:31:20 [name], [name] ([id number]) -------------------------------------------------------------------------------- SuperBill Details Patient Name: Jeppsen, Laquilla V. Date of Service: 04/16/2017 Medical Record Patient Account Number: [id number] Number: Treating RN: [name] 05-04-1941 (76 y.o. 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 I70.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 of foot Facility Procedures CPT4: Description Modifier Quantity Code [id number] 11042 - DEB SUBQ TISSUE 20 SQ CM/< 1 ICD-10 Description Diagnosis E11.622 Type 2 diabetes mellitus with other skin ulcer L97.519 Non-pressure chronic ulcer of other part of right foot with unspecified  severity Physician Procedures CPT4: Description Modifier Quantity Code [id number] 11042 - WC PHYS SUBQ TISS 20 SQ CM 1 ICD-10 Description Diagnosis E11.622 Type 2 diabetes mellitus with other skin ulcer L97.519 Non-pressure chronic ulcer of other part of right foot with unspecified  severity Electronic Signature(s) Signed: 04/16/2017 4:34:43 PM White: [name] Ham MD August, [name] ([id number]) Entered White: [name] on 04/16/2017 09:31:53

## 2017-04-17 NOTE — Progress Notes (Signed)
ROSLYNN, HOLTE ([id number]) Visit Report for 04/16/2017 Arrival Information Details Patient Name: [name], [name] Date of Service: 04/16/2017 8:45 AM Medical Record Number: [id number] Patient Account Number: [id number] Date of Birth/Sex: Dec 27, 1940 (76 y.o. Female) Treating RN: Carolyne Fiscal, Debi Primary Care Vesper Trant: Beverlyn Roux Other Clinician: Referring Winferd Wease: Beverlyn Roux Treating Delante Karapetyan/Extender: Tito Dine in Treatment: 23 Visit Information History Since Last Visit All ordered tests and consults were completed: No Patient Arrived: Wheel Chair Added or deleted any medications: No Arrival Time: 08:53 Any new allergies or adverse reactions: No Accompanied By: husband Had a fall or experienced change in No Transfer Assistance: EasyPivot activities of daily living that may affect Patient Lift risk of falls: Patient Identification Verified: Yes Signs or symptoms of abuse/neglect since last No Secondary Verification Process Yes visito Completed: Hospitalized since last visit: No Patient Requires Transmission- No Has Dressing in Place as Prescribed: Yes Based Precautions: Pain Present Now: No Patient Has Alerts: No Electronic Signature(s) Signed: 04/16/2017 4:54:17 PM By: Alric Quan Entered By: Alric Quan on 04/16/2017 08:53:29 [place]well, Darlyn V. ([id number]) -------------------------------------------------------------------------------- Encounter Discharge Information Details Patient Name: Gorczyca, Azharia V. Date of Service: 04/16/2017 8:45 AM Medical Record Number: [id number] Patient Account Number: [id number] Date of Birth/Sex: 18-Jun-1941 (76 y.o. Female) Treating RN: Carolyne Fiscal, Debi Primary Care Tacoya Altizer: Beverlyn Roux Other Clinician: Referring Eldred Sooy: Beverlyn Roux Treating Geanine Vandekamp/Extender: Tito Dine in Treatment: 53 Encounter Discharge Information Items Discharge Pain Level: 0 Discharge Condition: Stable Ambulatory Status:  Wheelchair Discharge Destination: Home Transportation: Private Auto Accompanied By: husband Schedule Follow-up Appointment: Yes Medication Reconciliation completed and provided to Patient/Care No Ronson Hagins: Provided on Clinical Summary of Care: 04/16/2017 Form Type Recipient Paper Patient [facility] Electronic Signature(s) Signed: 04/16/2017 4:54:17 PM By: Alric Quan Entered By: Alric Quan on 04/16/2017 10:02:06 Helder, Raymonde V. ([id number]) -------------------------------------------------------------------------------- Lower Extremity Assessment Details Patient Name: Pollak, Rhetta V. Date of Service: 04/16/2017 8:45 AM Medical Record Number: [id number] Patient Account Number: [id number] Date of Birth/Sex: [birth year] (76 y.o. Female) Treating RN: Carolyne Fiscal, Debi Primary Care Analeigha Nauman: Beverlyn Roux Other Clinician: Referring Starling Christofferson: Beverlyn Roux Treating Lewi Drost/Extender: Tito Dine in Treatment: 23 Vascular Assessment Pulses: Dorsalis Pedis Palpable: [Right:No] Doppler Audible: [Right:Yes] Posterior Tibial Extremity colors, hair growth, and conditions: Extremity Color: [Right:Hyperpigmented] Temperature of Extremity: [Right:Warm] Capillary Refill: [Right:> 3 seconds] Toe Nail Assessment Left: Right: Thick: Yes Discolored: Yes Deformed: Yes Improper Length and Hygiene: Yes Electronic Signature(s) Signed: 04/16/2017 4:54:17 PM By: Alric Quan Entered By: Alric Quan on 04/16/2017 09:04:11 Milbourne, Adeana V. ([id number]) -------------------------------------------------------------------------------- Multi Wound Chart Details Patient Name: Grine, Allicia V. Date of Service: 04/16/2017 8:45 AM Medical Record Number: [id number] Patient Account Number: [id number] Date of Birth/Sex: August 24, 1940 (76 y.o. 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] Comorbid History: [7:Cataracts, Chronic sinus N/A problems/congestion, Congestive Heart Failure, Hypertension, Type II Diabetes] Date Acquired: [7:08/06/2013] [N/A:N/A] Weeks of Treatment: [7:23] [N/A:N/A] Wound Status: [7:Open] [N/A:N/A] Measurements L x W x D 2x0.5x0.3 [N/A:N/A] (cm) Area (cm) : [7:0.785] [N/A:N/A] Volume (cm) : [7:0.236] [N/A:N/A] % Reduction in Area: [7:33.40%] [N/A:N/A] % Reduction in Volume: 33.10% [N/A:N/A] Classification: [7:Grade 2] [N/A:N/A] Exudate Amount: [  7:Large] [N/A:N/A] Exudate Type: [7:Serous] [N/A:N/A] Exudate Color: [7:amber] [N/A:N/A] Wound Margin: [7:Flat and Intact] [N/A:N/A] Granulation Amount: [7:Medium (34-66%)] [N/A:N/A] Granulation Quality: [7:Pink, Friable] [N/A:N/A] Necrotic Amount: [7:Medium (34-66%)] [N/A:N/A] Exposed Structures: [7:Fat Layer (Subcutaneous N/A Tissue) Exposed: Yes] Muscle: Yes Fascia: No Tendon: No Joint: No Bone: No Epithelialization: Small (1-33%) N/A N/A Periwound Skin Texture: Induration: Yes N/A N/A Scarring: Yes Periwound Skin Maceration: No N/A N/A Moisture: Periwound Skin Color: Ecchymosis: Yes N/A N/A Hemosiderin Staining: Yes Mottled: Yes Temperature: No Abnormality N/A N/A Tenderness on Yes N/A N/A Palpation: Wound Preparation: Ulcer Cleansing: N/A N/A Rinsed/Irrigated with Saline, Other: soap and water Topical Anesthetic Applied: Other: lidociane 4% Treatment Notes Electronic Signature(s) Signed: 04/16/2017 4:54:17 PM By: Alric Quan Entered By:  Alric Quan on 04/16/2017 09:08:37 [name] ([id number]) -------------------------------------------------------------------------------- Multi-Disciplinary Care Plan Details Patient Name: Alejos, Ailed V. Date of Service: 04/16/2017 8:45 AM Medical Record Number: [id number] Patient Account Number: [id number] Date of Birth/Sex: 1941-01-21 (76 y.o. Female) Treating RN: Carolyne Fiscal, Debi Primary Care Brentlee Delage: Beverlyn Roux Other Clinician: Referring Elcie Pelster: Beverlyn Roux Treating Markcus Lazenby/Extender: Tito Dine in Treatment: 23 Active Inactive ` Abuse / Safety / Falls / Self Care Management Nursing Diagnoses: Impaired physical mobility Potential for falls Goals: Patient will remain injury free Date Initiated: 11/06/2016 Target Resolution Date: 12/30/2016 Goal Status: Active Patient/caregiver will verbalize understanding of skin care regimen Date Initiated: 11/06/2016 Target Resolution Date: 12/30/2016 Goal Status: Active Interventions: Assess fall risk on admission and as needed Treatment Activities: Patient referred to home care : 11/06/2016 Notes: ` Nutrition Nursing Diagnoses: Potential for alteratiion in Nutrition/Potential for imbalanced nutrition Goals: Patient/caregiver verbalizes understanding of need to maintain therapeutic glucose control per primary care physician Date Initiated: 11/06/2016 Target Resolution Date: 12/30/2016 Goal Status: Active Interventions: Provide education on elevated blood sugars and impact on wound healing Capaldi, Troi V. ([id number]) Notes: ` Orientation to the Wound Care Program Nursing Diagnoses: Knowledge deficit related to the wound healing center program Goals: Patient/caregiver will verbalize understanding of the [place] Date Initiated: 11/06/2016 Target Resolution Date: 12/30/2016 Goal Status: Active Interventions: Provide education on orientation to the wound center Notes: ` Venous Leg  Ulcer Nursing Diagnoses: Actual venous Insuffiency (use after diagnosis is confirmed) Knowledge deficit related to disease process and management Goals: Non-invasive venous studies are completed as ordered Date Initiated: 11/06/2016 Target Resolution Date: 12/30/2016 Goal Status: Active Patient/caregiver will verbalize understanding of disease process and disease management Date Initiated: 11/06/2016 Target Resolution Date: 12/30/2016 Goal Status: Active Interventions: Assess peripheral edema status every visit. Notes: ` Wound/Skin Impairment Nursing Diagnoses: Impaired tissue integrity Knowledge deficit related to smoking impact on wound healing Knowledge deficit related to ulceration/compromised skin integrity Goals: Maye, Rylynn V. ([id number]) Ulcer/skin breakdown will heal within 14 weeks Date Initiated: 11/06/2016 Target Resolution Date: 02/05/2017 Goal Status: Active Interventions: Assess ulceration(s) every visit Treatment Activities: Skin care regimen initiated : 11/06/2016 Notes: Electronic Signature(s) Signed: 04/16/2017 4:54:17 PM By: Alric Quan Entered By: Alric Quan on 04/16/2017 09:08:28 Mosteller, Anyi V. ([id number]) -------------------------------------------------------------------------------- Pain Assessment Details Patient Name: Ousley, Miray V. Date of Service: 04/16/2017 8:45 AM Medical Record Number: [id number] Patient Account Number: [id number] Date of Birth/Sex: Feb 22, 1941 (76 y.o. Female) Treating RN: Carolyne Fiscal, Debi Primary Care Wilene Pharo: Beverlyn Roux Other Clinician: Referring Elzie Sheets: Beverlyn Roux Treating Laroy Mustard/Extender: Tito Dine in Treatment: 23 Active Problems Location of Pain Severity and Description of Pain Patient Has Paino No Site Locations Pain Management and Medication Current Pain Management: Electronic Signature(s) Signed: 04/16/2017 4:54:17 PM By:  Alric Quan Entered By: Alric Quan on 04/16/2017  08:53:34 Moscoso, Rekha [name] ([id number]) -------------------------------------------------------------------------------- Patient/Caregiver Education Details Patient Name: Westgate, Aundraya V. Date of Service: 04/16/2017 8:45 AM Medical Record Patient Account Number: [id number] Number: Treating RN: Ahmed Prima 1940-10-24 (76 y.o. Other Clinician: Date of Birth/Gender: Female) Treating ROBSON, [name] Primary Care Physician: Beverlyn Roux Physician/Extender: G Referring Physician: Dorthula Nettles in Treatment: 89 Education Assessment Education Provided To: Patient Education Topics Provided Wound/Skin Impairment: Handouts: Other: change dressing as ordered Methods: Demonstration, Explain/Verbal Responses: State content correctly Electronic Signature(s) Signed: 04/16/2017 4:54:17 PM By: Alric Quan Entered By: Alric Quan on 04/16/2017 10:02:18 Treu, Shaylie V. ([id number]) -------------------------------------------------------------------------------- Wound Assessment Details Patient Name: Totherow, Alexzandria V. Date of Service: 04/16/2017 8:45 AM Medical Record Number: [id number] Patient Account Number: [id number] Date of Birth/Sex: August 18, 1940 (76 y.o. Female) Treating RN: Carolyne Fiscal, Debi Primary Care Pama Roskos: Beverlyn Roux Other Clinician: Referring Velmer Woelfel: Beverlyn Roux Treating Milea Klink/Extender: Tito Dine in Treatment: 23 Wound Status Wound Number: 7 Primary Diabetic Wound/Ulcer of the Lower Etiology: Extremity Wound Location: Right Metatarsal head first - Dorsal Wound Open Status: Wounding Event: Gradually Appeared Comorbid Cataracts, Chronic sinus Date Acquired: 08/06/2013 History: problems/congestion, Congestive Heart Weeks Of Treatment: 23 Failure, Hypertension, Type II Diabetes Clustered Wound: No Photos Photo Uploaded By: Alric Quan on 04/16/2017 16:46:23 Wound Measurements Length: (cm) 2 Width: (cm) 0.5 Depth: (cm) 0.3 Area:  (cm) 0.785 Volume: (cm) 0.236 % Reduction in Area: 33.4% % Reduction in Volume: 33.1% Epithelialization: Small (1-33%) Tunneling: No Undermining: No Wound Description Classification: Grade 2 Wound Margin: Flat and Intact Exudate Amount: Large Exudate Type: Serous Exudate Color: amber Foul Odor After Cleansing: No Slough/Fibrino Yes Wound Bed Granulation Amount: Medium (34-66%) Exposed Structure Granulation Quality: Pink, Friable Fascia Exposed: No Necrotic Amount: Medium (34-66%) Fat Layer (Subcutaneous Tissue) Exposed: Yes Crossett, Davon V. ([id number]) Necrotic Quality: Adherent Slough Tendon Exposed: No Muscle Exposed: Yes Necrosis of Muscle: No Joint Exposed: No Bone Exposed: No Periwound Skin Texture Texture Color No Abnormalities Noted: No No Abnormalities Noted: No Induration: Yes Ecchymosis: Yes Scarring: Yes Hemosiderin Staining: Yes Mottled: Yes Moisture No Abnormalities Noted: No Temperature / Pain Maceration: No Temperature: No Abnormality Tenderness on Palpation: Yes Wound Preparation Ulcer Cleansing: Rinsed/Irrigated with Saline, Other: soap and water, Topical Anesthetic Applied: Other: lidociane 4%, Treatment Notes Wound #7 (Right, Dorsal Metatarsal head first) 1. Cleansed with: Clean wound with Normal Saline 2. Anesthetic Topical Lidocaine 4% cream to wound bed prior to debridement 4. Dressing Applied: Other dressing (specify in notes) 5. Secondary Dressing Applied Dry Gauze Foam Kerlix/Conform 7. Secured with Tape Notes endoform Electronic Signature(s) Signed: 04/16/2017 4:54:17 PM By: Alric Quan Entered By: Alric Quan on 04/16/2017 09:03:43 Vandenbos, Marvelous V. ([id number]) -------------------------------------------------------------------------------- Vitals Details Patient Name: Frankson, Jimesha V. Date of Service: 04/16/2017 8:45 AM Medical Record Number: [id number] Patient Account Number: [id number] Date of Birth/Sex:  12-19-1940 (76 y.o. Female) Treating RN: Carolyne Fiscal, Debi Primary Care Yashua Bracco: Beverlyn Roux Other Clinician: Referring Elroy Schembri: Beverlyn Roux Treating Cartez Mogle/Extender: Tito Dine in Treatment: 23 Vital Signs Time Taken: 08:53 Temperature (F): 98.2 Height (in): 64 Pulse (bpm): 77 Weight (lbs): 200 Respiratory Rate (breaths/min): 16 Body Mass Index (BMI): 34.3 Blood Pressure (mmHg): 152/53 Reference Range: 80 - 120 mg / dl Electronic Signature(s) Signed: 04/16/2017 4:54:17 PM By: Alric Quan Entered By: Alric Quan on 04/16/2017 08:57:57

## 2017-04-18 ENCOUNTER — Ambulatory Visit: Payer: Medicare HMO | Admitting: Vascular Surgery

## 2017-04-18 ENCOUNTER — Inpatient Hospital Stay (HOSPITAL_COMMUNITY): Admission: RE | Admit: 2017-04-18 | Payer: Medicare HMO | Source: Ambulatory Visit

## 2017-04-18 ENCOUNTER — Encounter (HOSPITAL_COMMUNITY): Payer: Medicare HMO

## 2017-04-22 ENCOUNTER — Encounter: Payer: Medicare HMO | Admitting: Internal Medicine

## 2017-04-22 ENCOUNTER — Ambulatory Visit: Payer: Medicare HMO | Admitting: Podiatry

## 2017-04-22 ENCOUNTER — Ambulatory Visit (INDEPENDENT_AMBULATORY_CARE_PROVIDER_SITE_OTHER): Payer: Medicare HMO | Admitting: Podiatry

## 2017-04-22 DIAGNOSIS — E0842 Diabetes mellitus due to underlying condition with diabetic polyneuropathy: Secondary | ICD-10-CM

## 2017-04-22 DIAGNOSIS — M79676 Pain in unspecified toe(s): Secondary | ICD-10-CM | POA: Diagnosis not present

## 2017-04-22 DIAGNOSIS — E11622 Type 2 diabetes mellitus with other skin ulcer: Secondary | ICD-10-CM | POA: Diagnosis not present

## 2017-04-22 DIAGNOSIS — B351 Tinea unguium: Secondary | ICD-10-CM | POA: Diagnosis not present

## 2017-04-23 NOTE — Progress Notes (Signed)
WONDA, GOODGAME ([id number]) Visit Report for 04/22/2017 Debridement Details Patient Name: [name], [name] Date of Service: 04/22/2017 8:45 AM Medical Record Patient Account Number: [id number] Number: Treating RN: Ahmed Prima 02-09-1941 (76 y.o. Other Clinician: Date of Birth/Sex: Female) Treating Doneshia Hill, [place] Primary Care Provider: Beverlyn Roux Provider/Extender: G Referring Provider: Dorthula Nettles in Treatment: 23 Debridement Performed for Wound #7 Right,Dorsal Metatarsal head first Assessment: Performed By: Physician Ricard Dillon, MD Debridement: Debridement Severity of Tissue Pre Fat layer exposed Debridement: Pre-procedure Verification/Time Out Yes - 09:03 Taken: Start Time: 09:04 Pain Control: Lidocaine 4% Topical Solution Level: Skin/Subcutaneous Tissue Total Area Debrided (L x 2.3 (cm) x 0.8 (cm) = 1.84 (cm) W): Tissue and other Viable, Non-Viable, Exudate, Fibrin/Slough, Subcutaneous material debrided: Instrument: Curette Bleeding: Minimum Hemostasis Achieved: Pressure End Time: 09:06 Procedural Pain: 0 Post Procedural Pain: 0 Response to Treatment: Procedure was tolerated well Post Debridement Measurements of Total Wound Length: (cm) 2.3 Width: (cm) 0.8 Depth: (cm) 0.3 Volume: (cm) 0.434 Character of Wound/Ulcer Post Requires Further Debridement Debridement: Severity of Tissue Post Debridement: Fat layer exposed Post Procedure Diagnosis Same as Pre-procedure ARLENA, MARSAN ([id number]) Electronic Signature(s) Signed: 04/22/2017 4:13:20 PM By: Linton Ham MD Signed: 04/22/2017 4:19:24 PM By: Alric Quan Entered By: Linton Ham on 04/22/2017 09:29:05 Martos, Kriss V. ([id number]) -------------------------------------------------------------------------------- HPI Details Patient Name: [name], [name] Date of Service: 04/22/2017 8:45 AM Medical Record Patient Account Number: [id number] Number: Treating  RN: Ahmed Prima 01/20/1941 (76 y.o. Other Clinician: Date of Birth/Sex: Female) Treating Dirck Butch Primary Care Provider: Beverlyn Roux Provider/Extender: G Referring Provider: Dorthula Nettles in Treatment: 23 History of Present Illness HPI Description: 08/13/16: This is a 76 year old woman who came predominantly for review of 3 cm in diameter circular wound to the left anterior lateral leg. She was in the ER on 08/01/16 I reviewed their notes. There was apparently pus coming out of the wound at that time and the patient arrived requesting debridement which they don't do in the emergency room. Nevertheless I can't see that they did any x-rays. There were no cultures done. She is a type II diabetic and I a note after the patient was in the clinic that she had a bypass graft from the popliteal to the tibial on the right on 02/28/16. She also had a right greater saphenous vein harvest on the same date for arterial bypass. She is going to have vascular studies including ABIs T ABIs on the right on 08/28/16. The patient's surgery was on 02/28/16 by Dr. Vallarie Mare she had a right below the knee popliteal artery to peroneal artery bypass with reverse greater saphenous vein and an endarterectomy of the mid segment peroneal artery. Postoperatively she had a strong mild monophasic peroneal signal with a pink foot. It would appear that the patient is had some nonhealing in the surgical saphenous vein harvest site on the left leg. Surprisingly [facility] I cannot see much information about this at all. Dr. Lucious Groves notes from 05/29/16 show that the patient's wounds "are not healed" the right first metatarsal wound healed but then opened back up. 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 At that point the patient had a wound VAC to her right calf with regards to the wounds on her  dorsal right toe would appear that these are felt to be arterial wounds. She has had surgery on the metatarsal phalangeal in  2015 I Dr. Doran Durand secondary to a right metatarsal phalangeal joint fracture. She is apparently had discoloration around this area since then. 08/28/16; patient arrives with her wounds in much the same condition. The linear vein harvest site and the circular wound below it which I think was a blister. She also has to probing holes in her right great toe and a necrotic eschar on the right second toe. Because of these being arterial wounds I reduced her compression from 3-2 layers this seems to of done satisfactorily she has not had any problems. I cannot see that she is actually had an x-ray ====== 11/06/16 the patient comes in for evaluation of her right lower extremity ulcers. She was here in January 2018 for 2 visits subsequently ended up in the hospital with pneumonia and then to rehabilitation. She has now been discharged from rehabilitation and is home. She has multiple ulcerations to her right lower extremity including the foot and toes. She does have home health in place and they have been placing alginate to the ulcers. She is followed by Dr. Bridgett Larsson of vascular medicine. 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 She recently saw him on 3/23. In office ABIs were: [name] ([id number]) Right 0.48 with monophasic flow to the DP, PT, peroneal Left 0.63 with monophasic flow to the DP and PT Her arterial studies indicated a patent right below knee popliteal to peroneal bypass She had an MRI in February 2008 that was negative frosty myelitis but this showed general soft tissue edema in the right foot and lower extremity concerning for cellulitis She is a diabetic, managed with insulin. Her hemoglobin A1c in December 2017 was 8.4 which is a trend up from previous levels. She had blood work in February  2018 which revealed an albumin of 2.6 this appears to be relatively acute as an albumin in November 2017 was 3.7 11/13/16; this is a patient I have not seen since February who is readmitted to our clinic last week. 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 Bridgett Larsson. I have reviewed Dr. Lianne Moris notes from March/23/18. Doppler ABI on that date showed an ABI on the right of 0.48 and on the left of 0.63. Dorsalis pedis waveforms were monophasic bilaterally. There was no waveforms detected at the posterior tibial on the right, monophasic on the left. Dr. Lianne Moris comments were that this patient would have follow-up vascular studies in 3 months including ABIs and right lower extremity arterial duplex. She had an MRI in February that was negative for osteomyelitis but showed generalized edema in the foot. Last albumin I see was in January at 3.4 we have been using Santyl to the 4 wounds on the right leg the patient is noted today to have widespread edema well up towards her groin this is pitting 2-3+. 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 Normal ejection fraction. 11/20/16; patient has a follow-up appointment with Dr. Bridgett Larsson on April 23. She is still complaining of a lot of pain in the right foot and right leg. It is not clear to me that this is at all positional however I think it is clear claudication with minimal activity perhaps at rest. At our suggestion she is return to her primary physician's office tomorrow with regards to her pitting lower extremity bilateral edema  that I reviewed in detail last week 11/27/16; the follow-up with Dr. Bridgett Larsson was actually on May 23 on April 23 as I stated in my note last week. N/A case all of her wounds seems somewhat smaller. The 2 on the right leg are definitely smaller. The areas on the dorsal right first toe, right third toe and the lateral  part of the right fifth metatarsal head all looks smaller but have tightly adherent surfaces. We have been using Santyl 12/04/16; follow-up with Dr. Bridgett Larsson on May 23. 2 small open wounds on the right leg continue to get smaller. The area on the right third total lateral aspect of the right fifth toe also look better. The remaining area on the dorsal first toe still has some depth to it. We have been using Santyl to the toes and collagen on the right 12/11/16; according to patient's husband the follow-up with Dr. Bridgett Larsson is not until July. All of the wounds on the right leg are measuring smaller. We have been using some combination of Prisma and Santyl although I think we can go to straight Prisma today. There may have been some confusion with home health about the primary dressing orders here. 12/25/16; the patient has had some healing this week. The area on her right lateral fifth metatarsal head, right third toe are both healed lower right leg is healed. In the vein harvest site superiorly she has one superficial open area. On the dorsal aspect of her right great toe/MTP joint the wound is now divided into 2 however the proximal area is deep and there is palpable bone 01/01/17; still an open area in the middle of her original right surgical scar. The area on the right third toe and right lateral fifth metatarsal head remained closed. Problematic area on the dorsal aspect of the toe. Previous surgery in this area line 01/08/17 small open area on the original scar on her upper anterior leg although this is closing. X-ray I did of the right first toe did not show underlying bony abnormality. Still this area on the dorsal first toe probes to bone. We have been using Prisma 01/15/17; small open area in the original scar in the upper anterior leg is almost fully closed. She has 2 open areas over the dorsal aspect of the right first toe that probes to bone. Used and a form starting last week. Vigorous bone  scraping that I did last week showed few methicillin sensitive staph aureus. She is allergic to Detienne, Bindi V. ([id number]) penicillin and sulfa drugs. I'm going to give her 2 weeks of doxycycline. She will need an MRI with contrast. 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 She continues on doxycycline for a bone scraping that showed methicillin sensitive staph aureus [allergic to penicillin and sulfa]. We have been using Endo form to the wound. 01/29/17; surprisingly her MRI did not show osteomyelitis. She continues on doxycycline for a bone scraping that showed methicillin sensitive staph aureus with allergies to penicillin and sulfa. We have been using Endo form to the wound. Unfortunately I cannot get a surface on this visit looks like it is able to support a healing state. Proximally there is still exposed bone. There is no overt soft tissue tenderness. Her MRI did show a previous fracture was surgery to this area but no hardware 02/12/17 on evaluation today patient appears to be doing well in regard to  her lower extremity wound. She does have some mild discomfort but this is minimal. There's no evidence of infection. Her left great toe nail has somewhat been lifted up and there is a little bit of slight bleeding underneath but this is still firmly attached. 02/19/17; she ran out of doxycycline 2 days ago. Nevertheless I would like to continue this for 2 weeks to make a full 6 weeks of therapy as the bone scraping that I did from the open area on the dorsal right first toe showed MRSA. This should complete antibiotic therapy. 02/26/17; the patient is completing her 6 weeks of oral doxycycline. Deep wound on the dorsal right toe. This still has some exposed bone proximally. She does have a reasonably granulated surface albeit thin over bone. I've been using Endo form have made application for an amniotic skin  sub 03/05/17; the patient has completed 6 weeks of doxycycline. This is a deep wound on the dorsal right toe which has exposed bone proximally. She has granulation over most of this wound albeit a thin layer. I've been using Endo form. Still do not have approval for [employer] 03/13/17 on evaluation today patient's right foot wound appears to be doing better measurement wise compared to her last evaluation. She has been tolerating the dressing change without complication and does have a appointment with the dietary that has been made as far as referral is concerned there just waiting for her and transportation to contact them back for an actual date. Nonetheless patient has been having less pain at this point. No fevers, chills, nausea, or vomiting noted at this time. 03/26/17; linear wound over the dorsal aspect of her right great toe. At one point this had exposed bone on the most superior aspect however I am pleased to see today that this appears to have a surface of granulation. Apparently product was applied for but denied by [facility]. We'll need to see what that was. The patient has known PAD status post revascularization. MRI did not show osteomyelitis which was done in June 04/02/17; continued improvement using Endoform. She was approved for [employer] but hasn't over $409 co-pay per application, this is beyond her means 04/09/17; continues on Endoform. 04/16/17; appears to be doing nicely continuing on Endoform 04/22/17; patient did not have her dressing changed all week because of the weather. Using Endoform. Base of the wound looks healthy elbow there appears to events surrounding maceration perhaps because of the drainage was not changing the wound dressing Electronic Signature(s) Signed: 04/22/2017 4:13:20 PM By: Linton Ham MD Entered By: Linton Ham on 04/22/2017 09:29:54 Holland, Kamarri [name] ([id number]) -------------------------------------------------------------------------------- Physical  Exam Details Patient Name: Gerling, Ilse V. Date of Service: 04/22/2017 8:45 AM Medical Record Patient Account Number: [id number] Number: Treating RN: Ahmed Prima [birth year] (76 y.o. Other Clinician: Date of Birth/Sex: Female) Treating Murtaza Shell Primary Care Provider: Beverlyn Roux Provider/Extender: G Referring Provider: Dorthula Nettles in Treatment: 71 Constitutional Patient is hypertensive.. Pulse regular and within target range for patient.[name] Respirations regular, non-labored and within target range.. Temperature is normal and within the target range for the patient.[name] appears in no distress. Notes When exam; linear wound on the dorsal aspect of the great toe on the right. Necrotic surface material removed with a #3 curet, hemostasis with direct pressure. She appears to have some surrounding maceration perhaps because of excessive drainage. No clear evidence of infection Electronic Signature(s) Signed: 04/22/2017 4:13:20 PM By: Linton Ham MD Entered By: Linton Ham on 04/22/2017 09:31:11 Busic, Maraki V. (  [id number]) -------------------------------------------------------------------------------- Physician Orders Details Patient Name: Mode, Shaquandra V. Date of Service: 04/22/2017 8:45 AM Medical Record Patient Account Number: [id number] Number: Treating RN: Ahmed Prima 1941-02-27 (76 y.o. Other Clinician: Date of Birth/Sex: Female) Treating Mairi Stagliano Primary Care Provider: Beverlyn Roux Provider/Extender: G Referring Provider: Dorthula Nettles in Treatment: 74 Verbal / Phone Orders: Yes ClinicianCarolyne Fiscal, Debi Read Back and Verified: Yes Diagnosis Coding Wound Cleansing Wound #7 Right,Dorsal Metatarsal head first o Clean wound with Normal Saline. o Cleanse wound with mild soap and water Anesthetic Wound #7 Right,Dorsal Metatarsal head first o Topical Lidocaine 4% cream applied to wound bed prior to  debridement Primary Wound Dressing Wound #7 Right,Dorsal Metatarsal head first o Other: - ENDOFORM Secondary Dressing Wound #7 Right,Dorsal Metatarsal head first o Dry Gauze o Conform/Kerlix o Drawtex Dressing Change Frequency Wound #7 Right,Dorsal Metatarsal head first o Change Dressing Monday, Wednesday, Friday Follow-up Appointments Wound #7 Right,Dorsal Metatarsal head first o Return Appointment in 1 week. Edema Control Wound #7 Right,Dorsal Metatarsal head first o Elevate legs to the level of the heart and pump ankles as often as possible Additional Orders / Instructions Wound #7 Right,Dorsal Metatarsal head first Domagalski, Ardyn V. ([id number]) o Increase protein intake. o Activity as tolerated Home Health Wound #7 Right,Dorsal Metatarsal head first o Continue Home Health Visits - [facility] weekly, [facility] o Home Health Nurse may visit PRN to address patientos wound care needs. o FACE TO FACE ENCOUNTER: MEDICARE and MEDICAID PATIENTS: I certify that this patient is under my care and that I had a face-to-face encounter that meets the physician face-to-face encounter requirements with this patient on this date. The encounter with the patient was in whole or in part for the following MEDICAL CONDITION: (primary reason for [place]) MEDICAL NECESSITY: I certify, that based on my findings, NURSING services are a medically necessary home health service. HOME BOUND STATUS: I certify that my clinical findings support that this patient is homebound (i.e., Due to illness or injury, pt requires aid of supportive devices such as crutches, cane, wheelchairs, walkers, the use of special transportation or the assistance of another person to leave their place of residence. There is a normal inability to leave the home and doing so requires considerable and taxing effort. Other absences are for medical reasons / religious services and are  infrequent or of short duration when for other reasons). o If current dressing causes regression in wound condition, may D/C ordered dressing product/s and apply Normal Saline Moist Dressing daily until next [place] / Other MD appointment. [place] of regression in wound condition at [phone]. o Please direct any NON-WOUND related issues/requests for orders to patient's Primary Care Physician Electronic Signature(s) Signed: 04/22/2017 4:13:20 PM By: Linton Ham MD Signed: 04/22/2017 4:19:24 PM By: Alric Quan Entered By: Alric Quan on 04/22/2017 09:23:40 Schmale, Particia V. ([id number]) -------------------------------------------------------------------------------- Problem List Details Patient Name: Rogus, Linn V. Date of Service: 04/22/2017 8:45 AM Medical Record Patient Account Number: [id number] Number: Treating RN: Ahmed Prima June 06, 1941 (76 y.o. 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 with 11/06/2016 Yes unspecified severity L97.219 Non-pressure chronic ulcer of  right calf with unspecified 11/06/2016 Yes severity E11.52 Type 2 diabetes mellitus 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 ([id number]) -------------------------------------------------------------------------------- Progress Note Details Patient Name: Gauss, Andalyn V. Date of Service: 04/22/2017 8:45 AM Medical Record Patient Account Number: [id number] Number: Treating RN: Ahmed Prima 1941-03-19 (76 y.o. Other Clinician: Date of Birth/Sex: Female) Treating Alichia Alridge Primary Care Provider: Beverlyn Roux Provider/Extender: G Referring Provider: Dorthula Nettles in Treatment: 23 Subjective History of Present Illness (HPI) 08/13/16: This is a 76 year old woman who came predominantly for review of 3 cm in diameter circular wound to the left anterior lateral leg. She was in the ER on 08/01/16 I reviewed their notes. There was apparently pus coming out of the wound at that time and the patient arrived requesting debridement which they don't do in the emergency room. Nevertheless I can't see that they did any x-rays. There were no cultures done. She is a type II diabetic and I a note after the patient was in the clinic that she had a bypass graft from the popliteal to the tibial on the right on 02/28/16. She also had a right greater saphenous vein harvest on the same date for arterial bypass. She is going to have vascular studies including ABIs T ABIs on the right on 08/28/16. The patient's surgery was on 02/28/16 by Dr. Vallarie Mare she had a right below the knee popliteal artery to peroneal artery bypass with reverse greater saphenous vein and an endarterectomy of the mid segment peroneal artery. Postoperatively she had a strong mild monophasic peroneal signal with a pink foot. It would appear that the patient is had some nonhealing in the surgical saphenous vein harvest site on the left leg. Surprisingly looking [facility] I cannot see much information about this at all. Dr. Lucious Groves notes from  05/29/16 show that the patient's wounds "are not healed" the right first metatarsal wound healed but then opened back up. 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 At that point the patient had a wound VAC to her right calf with regards to the wounds on her dorsal right toe would appear that these are felt to be arterial wounds. She has had surgery on the metatarsal phalangeal in 2015 I Dr. Doran Durand secondary to a right metatarsal phalangeal joint fracture. She is apparently had discoloration around this area since then. 08/28/16; patient arrives with her wounds in much the same condition. The linear vein harvest site and the circular wound below it which I think was a blister. She also has to probing holes in her right great toe and a necrotic eschar on the right second toe. Because of these being arterial wounds I reduced her compression from 3-2 layers this seems to of done satisfactorily she has not had any problems. I cannot see that she is actually had an x-ray ====== 11/06/16 the patient comes in for evaluation of her right lower extremity ulcers. She was here in January 2018 for 2 visits subsequently ended up in the hospital with pneumonia and then to rehabilitation. She has now been discharged  from rehabilitation and is home. She has multiple ulcerations to her right lower extremity including the foot and toes. She does have home health in place and they have been placing alginate to the ulcers. She is followed by Dr. Bridgett Larsson of vascular medicine. 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 She recently saw him on 3/23. In office ABIs were: [place], SULY VUKELICH. ([id number]) Right 0.48 with monophasic flow to the DP, PT, peroneal Left 0.63 with monophasic flow to the DP and PT Her arterial studies indicated a patent right below  knee popliteal to peroneal bypass She had an MRI in February 2008 that was negative frosty myelitis but this showed general soft tissue edema in the right foot and lower extremity concerning for cellulitis She is a diabetic, managed with insulin. Her hemoglobin A1c in December 2017 was 8.4 which is a trend up from previous levels. She had blood work in February 2018 which revealed an albumin of 2.6 this appears to be relatively acute as an albumin in November 2017 was 3.7 11/13/16; this is a patient I have not seen since February who is readmitted to our clinic last week. 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 Bridgett Larsson. I have reviewed Dr. Lianne Moris notes from March/23/18. Doppler ABI on that date showed an ABI on the right of 0.48 and on the left of 0.63. Dorsalis pedis waveforms were monophasic bilaterally. There was no waveforms detected at the posterior tibial on the right, monophasic on the left. Dr. Lianne Moris comments were that this patient would have follow-up vascular studies in 3 months including ABIs and right lower extremity arterial duplex. She had an MRI in February that was negative for osteomyelitis but showed generalized edema in the foot. Last albumin I see was in January at 3.4 we have been using Santyl to the 4 wounds on the right leg the patient is noted today to have widespread edema well up towards her groin this is pitting 2-3+. 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 Normal ejection fraction. 11/20/16; patient has a follow-up appointment with Dr. Bridgett Larsson on April 23. She is still complaining of a lot of pain in the right foot and right leg. It is not clear to me that this is at all positional however I think it is clear claudication with minimal activity perhaps at rest. At our suggestion she is return to her primary physician's office tomorrow with  regards to her pitting lower extremity bilateral edema that I reviewed in detail last week 11/27/16; the follow-up with Dr. Bridgett Larsson was actually on May 23 on April 23 as I stated in my note last week. N/A case all of her wounds seems somewhat smaller. The 2 on the right leg are definitely smaller. The areas on the dorsal right first toe, right third toe and the lateral part of the right fifth metatarsal head all looks smaller but have tightly adherent surfaces. We have been using Santyl 12/04/16; follow-up with Dr. Bridgett Larsson on May 23. 2 small open wounds on the right leg continue to get smaller. The area on the right third total lateral aspect of the right fifth toe also look better. The remaining area on the dorsal first toe still has some depth to it. We have been using Santyl to the toes and collagen on the  right 12/11/16; according to patient's husband the follow-up with Dr. Bridgett Larsson is not until July. All of the wounds on the right leg are measuring smaller. We have been using some combination of Prisma and Santyl although I think we can go to straight Prisma today. There may have been some confusion with home health about the primary dressing orders here. 12/25/16; the patient has had some healing this week. The area on her right lateral fifth metatarsal head, right third toe are both healed lower right leg is healed. In the vein harvest site superiorly she has one superficial open area. On the dorsal aspect of her right great toe/MTP joint the wound is now divided into 2 however the proximal area is deep and there is palpable bone 01/01/17; still an open area in the middle of her original right surgical scar. The area on the right third toe and right lateral fifth metatarsal head remained closed. Problematic area on the dorsal aspect of the toe. Previous surgery in this area line 01/08/17 small open area on the original scar on her upper anterior leg although this is closing. X-ray I did of the right first  toe did not show underlying bony abnormality. Still this area on the dorsal first toe probes to bone. We have been using Prisma 01/15/17; small open area in the original scar in the upper anterior leg is almost fully closed. She has 2 open areas over the dorsal aspect of the right first toe that probes to bone. Used and a form starting last week. Vigorous bone scraping that I did last week showed few methicillin sensitive staph aureus. She is allergic to Payano, Calaya V. ([id number]) penicillin and sulfa drugs. I'm going to give her 2 weeks of doxycycline. She will need an MRI with contrast. 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 She continues on doxycycline for a bone scraping that showed methicillin sensitive staph aureus [allergic to penicillin and sulfa]. We have been using Endo form to the wound. 01/29/17; surprisingly her MRI did not show osteomyelitis. She continues on doxycycline for a bone scraping that showed methicillin sensitive staph aureus with allergies to penicillin and sulfa. We have been using Endo form to the wound. Unfortunately I cannot get a surface on this visit looks like it is able to support a healing state. Proximally there is still exposed bone. There is no overt soft tissue tenderness. Her MRI did show a previous fracture was surgery to this area but no hardware 02/12/17 on evaluation today patient appears to be doing well in regard to her lower extremity wound. She does have some mild discomfort but this is minimal. There's no evidence of infection. Her left great toe nail has somewhat been lifted up and there is a little bit of slight bleeding underneath but this is still firmly attached. 02/19/17; she ran out of doxycycline 2 days ago. Nevertheless I would like to continue this for 2 weeks to make a full 6 weeks of therapy as the bone scraping that I did from the open area on the dorsal  right first toe showed MRSA. This should complete antibiotic therapy. 02/26/17; the patient is completing her 6 weeks of oral doxycycline. Deep wound on the dorsal right toe. This still has some exposed bone proximally. She does have a reasonably granulated surface albeit thin over bone. I've been using Endo form have made application for an amniotic skin  sub 03/05/17; the patient has completed 6 weeks of doxycycline. This is a deep wound on the dorsal right toe which has exposed bone proximally. She has granulation over most of this wound albeit a thin layer. I've been using Endo form. Still do not have approval for [employer] 03/13/17 on evaluation today patient's right foot wound appears to be doing better measurement wise compared to her last evaluation. She has been tolerating the dressing change without complication and does have a appointment with the dietary that has been made as far as referral is concerned there just waiting for her and transportation to contact them back for an actual date. Nonetheless patient has been having less pain at this point. No fevers, chills, nausea, or vomiting noted at this time. 03/26/17; linear wound over the dorsal aspect of her right great toe. At one point this had exposed bone on the most superior aspect however I am pleased to see today that this appears to have a surface of granulation. Apparently product was applied for but denied by [facility]. We'll need to see what that was. The patient has known PAD status post revascularization. MRI did not show osteomyelitis which was done in June 04/02/17; continued improvement using Endoform. She was approved for [facility] but hasn't over $546 co-pay per application, this is beyond her means 04/09/17; continues on Endoform. 04/16/17; appears to be doing nicely continuing on Endoform 04/22/17; patient did not have her dressing changed all week because of the weather. Using Endoform. Base of the wound looks healthy elbow there  appears to events surrounding maceration perhaps because of the drainage was not changing the wound dressing Objective Macquarrie, Imara V. ([id number]) Constitutional Patient is hypertensive.. Pulse regular and within target range for patient.[name] Respirations regular, non-labored and within target range.. Temperature is normal and within the target range for the patient.[name] appears in no distress. Vitals Time Taken: 8:41 AM, Height: 64 in, Weight: 200 lbs, BMI: 34.3, Temperature: 97.6 F, Pulse: 78 bpm, Respiratory Rate: 16 breaths/min, Blood Pressure: 147/50 mmHg. General Notes: When exam; linear wound on the dorsal aspect of the great toe on the right. Necrotic surface material removed with a #3 curet, hemostasis with direct pressure. She appears to have some surrounding maceration perhaps because of excessive drainage. No clear evidence of infection Integumentary (Hair, Skin) Wound #7 status is Open. Original cause of wound was Gradually Appeared. The wound is located on the Right,Dorsal Metatarsal head first. The wound measures 2.3cm length x 0.8cm width x 0.2cm depth; 1.445cm^2 area and 0.289cm^3 volume. There is muscle and Fat Layer (Subcutaneous Tissue) Exposed exposed. There is no tunneling or undermining noted. There is a large amount of serous drainage noted. The wound margin is flat and intact. There is medium (34-66%) pink, friable granulation within the wound bed. There is a medium (34-66%) amount of necrotic tissue within the wound bed including Adherent Slough. The periwound skin appearance exhibited: Induration, Scarring, Maceration, Ecchymosis, Hemosiderin Staining, Mottled. Periwound temperature was noted as No Abnormality. The periwound has tenderness on palpation. 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 ([id number]) Pre-procedure diagnosis of Wound #7 is a Diabetic Wound/Ulcer of the Lower Extremity located on the Right,Dorsal Metatarsal head first .Severity of Tissue Pre Debridement is: Fat layer exposed. There was a Skin/Subcutaneous Tissue Debridement ([id number]) debridement with total area of 1.84 sq cm performed by Ricard Dillon, MD. with the following instrument(s): Curette to remove Viable and Non-Viable tissue/material including Exudate, Fibrin/Slough, and Subcutaneous after achieving pain control using Lidocaine 4% Topical Solution. A time out was conducted at 09:03, prior to the start of the procedure. A Minimum amount of bleeding was controlled with Pressure. The procedure was tolerated well with a pain level of 0 throughout and a pain level of 0 following the procedure. Post Debridement Measurements: 2.3cm length x 0.8cm width x 0.3cm depth; 0.434cm^3 volume. Character of Wound/Ulcer Post Debridement requires further debridement. Severity of Tissue Post Debridement is: Fat layer exposed. Post procedure Diagnosis Wound #7: Same as Pre-Procedure Plan Wound Cleansing: Wound #7 Right,Dorsal Metatarsal head first: Clean wound with Normal Saline. Cleanse wound with mild soap and water Anesthetic: Wound #7 Right,Dorsal Metatarsal head first: Topical Lidocaine 4% cream applied to wound bed prior to debridement Primary Wound Dressing: Wound #7 Right,Dorsal Metatarsal head first: Other: - ENDOFORM Secondary Dressing: Wound #7 Right,Dorsal Metatarsal head first: Dry  Gauze Conform/Kerlix Drawtex Dressing Change Frequency: Wound #7 Right,Dorsal Metatarsal head first: Change Dressing Monday, Wednesday, Friday Follow-up Appointments: Wound #7 Right,Dorsal Metatarsal head first: Return Appointment in 1 week. Edema Control: Wound #7 Right,Dorsal Metatarsal head first: Elevate legs to the level of the heart and pump ankles as often as possible Additional Orders / Instructions: Wound #7 Right,Dorsal Metatarsal head first: Increase protein intake. Activity as tolerated Home Health: Wound #7 Right,Dorsal Metatarsal head first: Reichard, GREY SCHLAUCH ([id number]) [place] [facility] - [facility] twice weekly, [facility] Home Health Nurse may visit PRN to address patient s wound care needs. FACE TO FACE ENCOUNTER: MEDICARE and MEDICAID PATIENTS: I certify that this patient is under my care and that I had a face-to-face encounter that meets the physician face-to-face encounter requirements with this patient on this date. The encounter with the patient was in whole or in part for the following MEDICAL CONDITION: (primary reason for [place]) MEDICAL NECESSITY: I certify, that based on my findings, NURSING services are a medically necessary home health service. HOME BOUND STATUS: I certify that my clinical findings support that this patient is homebound (i.e., Due to illness or injury, pt requires aid of supportive devices such as crutches, cane, wheelchairs, walkers, the use of special transportation or the assistance of another person to leave their place of residence. There is a normal inability to leave the home and doing so requires considerable and taxing effort. Other absences are for medical reasons / religious services and are infrequent or of short duration when for other reasons). If current dressing causes regression in wound condition, may D/C ordered dressing product/s and apply Normal Saline Moist Dressing daily until next  [place] / Other MD appointment. [place] of regression in wound condition at [phone]. Please direct any NON-WOUND related issues/requests for orders to patient's Primary Care Physician #1 continue Endoform #2 absorbtive secondary dressing such as xtrasorb Doctor, hospital) Signed: 04/22/2017 4:13:20 PM By: Linton Ham MD Entered By: Linton Ham on 04/22/2017 09:33:20 Vanes, Niomi [name] ([id number]) -------------------------------------------------------------------------------- SuperBill Details Patient Name: Lenzo, Alaysia V. Date of Service: 04/22/2017  Medical Record Patient Account Number: [id number] Number: Treating RN: Ahmed Prima February 24, 1941 (76 y.o. 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 I70.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 of foot Facility Procedures CPT4: Description Modifier Quantity Code [id number] 11042 - DEB SUBQ TISSUE 20 SQ CM/< 1 ICD-10 Description Diagnosis 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

## 2017-04-23 NOTE — Progress Notes (Signed)
MAKYIAH, LIE ([id number]) Visit Report for 04/22/2017 Arrival Information Details Patient Name: Bohnet, TAMALYN WADSWORTH. Date of Service: 04/22/2017 8:45 AM Medical Record Number: [id number] Patient Account Number: [id number] Date of Birth/Sex: 08-08-1940 (76 y.o. Female) Treating RN: Carolyne Fiscal, Debi Primary Care Amyre Segundo: Beverlyn Roux Other Clinician: Referring Bertie Simien: Beverlyn Roux Treating Asees Manfredi/Extender: Tito Dine in Treatment: 23 Visit Information History Since Last Visit All ordered tests and consults were completed: No Patient Arrived: Wheel Chair Added or deleted any medications: No Arrival Time: 08:39 Any new allergies or adverse reactions: No Accompanied By: husband Had a fall or experienced change in No Transfer Assistance: EasyPivot activities of daily living that may affect Patient Lift risk of falls: Patient Identification Verified: Yes Signs or symptoms of abuse/neglect since last No Secondary Verification Process Yes visito Completed: Hospitalized since last visit: No Patient Requires Transmission- No Has Dressing in Place as Prescribed: Yes Based Precautions: Pain Present Now: No Patient Has Alerts: No Electronic Signature(s) Signed: 04/22/2017 4:19:24 PM By: Alric Quan Entered By: Alric Quan on 04/22/2017 08:41:09 Pecore, Khristi V. ([id number]) -------------------------------------------------------------------------------- Encounter Discharge Information Details Patient Name: Schwegler, Arielle V. Date of Service: 04/22/2017 8:45 AM Medical Record Number: [id number] Patient Account Number: [id number] Date of Birth/Sex: 04-09-1941 (76 y.o. Female) Treating RN: Carolyne Fiscal, Debi Primary Care Duffy Dantonio: Beverlyn Roux Other Clinician: Referring Tamecia Mcdougald: Beverlyn Roux Treating Cutter Passey/Extender: Tito Dine in Treatment: 61 Encounter Discharge Information Items Discharge Pain Level: 0 Discharge Condition: Stable Ambulatory Status:  Wheelchair Discharge Destination: Home Transportation: Private Auto Accompanied By: husband Schedule Follow-up Appointment: Yes Medication Reconciliation completed and provided to Patient/Care No Deonta Bomberger: Provided on Clinical Summary of Care: 04/22/2017 Form Type Recipient Paper Patient [facility] Electronic Signature(s) Signed: 04/22/2017 4:18:12 PM By: Ruthine Dose Entered By: Ruthine Dose on 04/22/2017 09:22:20 Kaminski, Samaria V. ([id number]) -------------------------------------------------------------------------------- Lower Extremity Assessment Details Patient Name: Shockley, Alyze V. Date of Service: 04/22/2017 8:45 AM Medical Record Number: [id number] Patient Account Number: [id number] Date of Birth/Sex: 1941-04-21 (76 y.o. Female) Treating RN: Carolyne Fiscal, Debi Primary Care Tye Vigo: Beverlyn Roux Other Clinician: Referring Kenwood Rosiak: Beverlyn Roux Treating Kynzee Devinney/Extender: Tito Dine in Treatment: 23 Vascular Assessment Pulses: Dorsalis Pedis Palpable: [Right:No] Doppler Audible: [Right:Yes] Posterior Tibial Extremity colors, hair growth, and conditions: Extremity Color: [Right:Hyperpigmented] Temperature of Extremity: [Right:Warm] Capillary Refill: [Right:> 3 seconds] Toe Nail Assessment Left: Right: Thick: Yes Discolored: Yes Deformed: Yes Improper Length and Hygiene: Yes Electronic Signature(s) Signed: 04/22/2017 4:19:24 PM By: Alric Quan Entered By: Alric Quan on 04/22/2017 08:49:29 Dart, Magdelena V. ([id number]) -------------------------------------------------------------------------------- Multi Wound Chart Details Patient Name: Pound, Imogene V. Date of Service: 04/22/2017 8:45 AM Medical Record Number: [id number] Patient Account Number: [id number] Date of Birth/Sex: 1940/10/06 (76 y.o. 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] Comorbid History: [7:Cataracts, Chronic sinus N/A problems/congestion, Congestive Heart Failure, Hypertension, Type II Diabetes] Date Acquired: [7:08/06/2013] [N/A:N/A] Weeks of Treatment: [7:23] [N/A:N/A] Wound Status: [7:Open] [N/A:N/A] Measurements L x W x D 2.3x0.8x0.2 [N/A:N/A] (cm) Area (cm) : [7:1.445] [N/A:N/A] Volume (cm) : [7:0.289] [N/A:N/A] % Reduction in Area: [7:-22.70%] [N/A:N/A] % Reduction in Volume: 18.10% [N/A:N/A] Classification: [7:Grade 2] [N/A:N/A] Exudate Amount: [  7:Large] [N/A:N/A] Exudate Type: [7:Serous] [N/A:N/A] Exudate Color: [7:amber] [N/A:N/A] Wound Margin: [7:Flat and Intact] [N/A:N/A] Granulation Amount: [7:Large (67-100%)] [N/A:N/A] Granulation Quality: [7:Pink, Friable] [N/A:N/A] Necrotic Amount: [7:Small (1-33%)] [N/A:N/A] Exposed Structures: [7:Fat Layer (Subcutaneous N/A Tissue) Exposed: Yes] Muscle: Yes Fascia: No Tendon: No Joint: No Bone: No Epithelialization: Small (1-33%) N/A N/A Debridement: Debridement (53976- N/A N/A 11047) Pre-procedure 09:03 N/A N/A Verification/Time Out Taken: Pain Control: Lidocaine 4% Topical N/A N/A Solution Tissue Debrided: Fibrin/Slough, Exudates, N/A N/A Subcutaneous Level: Skin/Subcutaneous N/A N/A Tissue Debridement Area (sq 1.84 N/A N/A cm): Instrument: Curette N/A N/A Bleeding: Minimum N/A N/A Hemostasis Achieved: Pressure N/A N/A Procedural Pain: 0 N/A N/A Post Procedural Pain: 0 N/A N/A Debridement Treatment Procedure was tolerated N/A N/A Response: well Post  Debridement 2.3x0.8x0.3 N/A N/A Measurements L x W x D (cm) Post Debridement 0.434 N/A N/A Volume: (cm) Periwound Skin Texture: Induration: Yes N/A N/A Scarring: Yes Periwound Skin Maceration: No N/A N/A Moisture: Periwound Skin Color: Ecchymosis: Yes N/A N/A Hemosiderin Staining: Yes Mottled: Yes Temperature: No Abnormality N/A N/A Tenderness on Yes N/A N/A Palpation: Wound Preparation: Ulcer Cleansing: N/A N/A Rinsed/Irrigated with Saline, Other: soap and water Topical Anesthetic Applied: Other: lidociane 4% Procedures Performed: Debridement N/A N/A Incorvaia, Mirielle V. ([id number]) Treatment Notes Wound #7 (Right, Dorsal Metatarsal head first) 1. Cleansed with: Clean wound with Normal Saline 2. Anesthetic Topical Lidocaine 4% cream to wound bed prior to debridement 4. Dressing Applied: Other dressing (specify in notes) 5. Secondary Dressing Applied Dry Gauze Kerlix/Conform 7. Secured with Tape Notes endoform, drawtex Electronic Signature(s) Signed: 04/22/2017 4:13:20 PM By: Linton Ham MD Entered By: Linton Ham on 04/22/2017 09:28:46 Berber, Baylie [name] ([id number]) -------------------------------------------------------------------------------- Multi-Disciplinary Care Plan Details Patient Name: Robbins, Mykah V. Date of Service: 04/22/2017 8:45 AM Medical Record Number: [id number] Patient Account Number: [id number] Date of Birth/Sex: 09/12/1940 (76 y.o. Female) Treating RN: Carolyne Fiscal, Debi Primary Care Obert Espindola: Beverlyn Roux Other Clinician: Referring Tarren Sabree: Beverlyn Roux Treating Danetta Prom/Extender: Tito Dine in Treatment: 23 Active Inactive ` Abuse / Safety / Falls / Self Care Management Nursing Diagnoses: Impaired physical mobility Potential for falls Goals: Patient will remain injury free Date Initiated: 11/06/2016 Target Resolution Date: 12/30/2016 Goal Status: Active Patient/caregiver will verbalize understanding of skin care  regimen Date Initiated: 11/06/2016 Target Resolution Date: 12/30/2016 Goal Status: Active Interventions: Assess fall risk on admission and as needed Treatment Activities: Patient referred to home care : 11/06/2016 Notes: ` Nutrition Nursing Diagnoses: Potential for alteratiion in Nutrition/Potential for imbalanced nutrition Goals: Patient/caregiver verbalizes understanding of need to maintain therapeutic glucose control per primary care physician Date Initiated: 11/06/2016 Target Resolution Date: 12/30/2016 Goal Status: Active Interventions: Provide education on elevated blood sugars and impact on wound healing Neiswender, Irem V. ([id number]) Notes: ` Orientation to the Wound Care Program Nursing Diagnoses: Knowledge deficit related to the wound healing center program Goals: Patient/caregiver will verbalize understanding of the [facility] Date Initiated: 11/06/2016 Target Resolution Date: 12/30/2016 Goal Status: Active Interventions: Provide education on orientation to the wound center Notes: ` Venous Leg Ulcer Nursing Diagnoses: Actual venous Insuffiency (use after diagnosis is confirmed) Knowledge deficit related to disease process and management Goals: Non-invasive venous studies are completed as ordered Date Initiated: 11/06/2016 Target Resolution Date: 12/30/2016 Goal Status: Active Patient/caregiver will verbalize understanding of disease process and disease management Date Initiated: 11/06/2016 Target Resolution Date: 12/30/2016 Goal Status: Active Interventions: Assess peripheral edema status every visit. Notes: ` Wound/Skin Impairment Nursing Diagnoses: Impaired tissue integrity Knowledge deficit related to smoking  impact on wound healing Knowledge deficit related to ulceration/compromised skin integrity Goals: Coles, Ondine V. ([id number]) Ulcer/skin breakdown will heal within 14 weeks Date Initiated: 11/06/2016 Target Resolution Date: 02/05/2017 Goal  Status: Active Interventions: Assess ulceration(s) every visit Treatment Activities: Skin care regimen initiated : 11/06/2016 Notes: Electronic Signature(s) Signed: 04/22/2017 4:19:24 PM By: Alric Quan Entered By: Alric Quan on 04/22/2017 08:56:38 Bacus, Sophiya V. ([id number]) -------------------------------------------------------------------------------- Pain Assessment Details Patient Name: Lindseth, Taleia V. Date of Service: 04/22/2017 8:45 AM Medical Record Number: [id number] Patient Account Number: [id number] Date of Birth/Sex: Sep 23, 1940 (76 y.o. Female) Treating RN: Carolyne Fiscal, Debi Primary Care Rafe Mackowski: Beverlyn Roux Other Clinician: Referring Winola Drum: Beverlyn Roux Treating Ainhoa Rallo/Extender: Tito Dine in Treatment: 23 Active Problems Location of Pain Severity and Description of Pain Patient Has Paino No Site Locations Pain Management and Medication Current Pain Management: Electronic Signature(s) Signed: 04/22/2017 4:19:24 PM By: Alric Quan Entered By: Alric Quan on 04/22/2017 08:41:16 Forni, Linet V. ([id number]) -------------------------------------------------------------------------------- Patient/Caregiver Education Details Patient Name: Sefcik, Romonda V. Date of Service: 04/22/2017 8:45 AM Medical Record Patient Account Number: [id number] Number: Treating RN: Ahmed Prima 24-Feb-1941 (76 y.o. Other Clinician: Date of Birth/Gender: Female) Treating ROBSON, [name] Primary Care Physician: Beverlyn Roux Physician/Extender: G Referring Physician: Dorthula Nettles in Treatment: 38 Education Assessment Education Provided To: Patient Education Topics Provided Wound/Skin Impairment: Handouts: Other: change dressing as ordered Methods: Demonstration, Explain/Verbal Responses: State content correctly Electronic Signature(s) Signed: 04/22/2017 4:19:24 PM By: Alric Quan Entered By: Alric Quan on 04/22/2017  08:57:32 Goree, Jonella V. ([id number]) -------------------------------------------------------------------------------- Wound Assessment Details Patient Name: Malay, Krisy V. Date of Service: 04/22/2017 8:45 AM Medical Record Number: [id number] Patient Account Number: [id number] Date of Birth/Sex: 1940-11-20 (76 y.o. Female) Treating RN: Carolyne Fiscal, Debi Primary Care Yareth Macdonnell: Beverlyn Roux Other Clinician: Referring Kit Brubacher: Beverlyn Roux Treating Schneur Crowson/Extender: Tito Dine in Treatment: 23 Wound Status Wound Number: 7 Primary Diabetic Wound/Ulcer of the Lower Etiology: Extremity Wound Location: Right Metatarsal head first - Dorsal Wound Open Status: Wounding Event: Gradually Appeared Comorbid Cataracts, Chronic sinus Date Acquired: 08/06/2013 History: problems/congestion, Congestive Heart Weeks Of Treatment: 23 Failure, Hypertension, Type II Diabetes Clustered Wound: No Photos Wound Measurements Length: (cm) 2.3 Width: (cm) 0.8 Depth: (cm) 0.2 Area: (cm) 1.445 Volume: (cm) 0.289 % Reduction in Area: -22.7% % Reduction in Volume: 18.1% Epithelialization: Small (1-33%) Tunneling: No Undermining: No Wound Description Classification: Grade 2 Wound Margin: Flat and Intact Exudate Amount: Large Exudate Type: Serous Exudate Color: amber Foul Odor After Cleansing: No Slough/Fibrino Yes Wound Bed Granulation Amount: Medium (34-66%) Exposed Structure Granulation Quality: Pink, Friable Fascia Exposed: No Necrotic Amount: Medium (34-66%) Fat Layer (Subcutaneous Tissue) Exposed: Yes Necrotic Quality: Adherent Slough Tendon Exposed: No Gwaltney, Lyrah V. ([id number]) Muscle Exposed: Yes Necrosis of Muscle: No Joint Exposed: No Bone Exposed: No Periwound Skin Texture Texture Color No Abnormalities Noted: No No Abnormalities Noted: No Induration: Yes Ecchymosis: Yes Scarring: Yes Hemosiderin Staining: Yes Mottled: Yes Moisture No Abnormalities Noted: No  Temperature / Pain Maceration: Yes Temperature: No Abnormality Tenderness on Palpation: Yes Wound Preparation Ulcer Cleansing: Rinsed/Irrigated with Saline, Other: soap and water, Topical Anesthetic Applied: Other: lidociane 4%, Treatment Notes Wound #7 (Right, Dorsal Metatarsal head first) 1. Cleansed with: Clean wound with Normal Saline 2. Anesthetic Topical Lidocaine 4% cream to wound bed prior to debridement 4. Dressing Applied: Other dressing (specify in notes) 5. Secondary Dressing Applied Dry Gauze Kerlix/Conform 7. Secured with Tape Notes endoform, drawtex Electronic Signature(s) Signed: 04/22/2017 4:19:24 PM By: Alric Quan Entered By:  Alric Quan on 04/22/2017 09:31:17 Tollison, Arley V. ([id number]) -------------------------------------------------------------------------------- Vitals Details Patient Name: Thurlow, Jenavi V. Date of Service: 04/22/2017 8:45 AM Medical Record Number: [id number] Patient Account Number: [id number] Date of Birth/Sex: [birth year] (76 y.o. Female) Treating RN: Carolyne Fiscal, Debi Primary Care Brayleigh Rybacki: Beverlyn Roux Other Clinician: Referring Sameria Morss: Beverlyn Roux Treating Mason Dibiasio/Extender: Tito Dine in Treatment: 23 Vital Signs Time Taken: 08:41 Temperature (F): 97.6 Height (in): 64 Pulse (bpm): 78 Weight (lbs): 200 Respiratory Rate (breaths/min): 16 Body Mass Index (BMI): 34.3 Blood Pressure (mmHg): 147/50 Reference Range: 80 - 120 mg / dl Electronic Signature(s) Signed: 04/22/2017 4:19:24 PM By: Alric Quan Entered By: Alric Quan on 04/22/2017 08:41:38

## 2017-04-25 NOTE — Progress Notes (Signed)
   SUBJECTIVE Patient with a history of diabetes mellitus presents to office today complaining of elongated, thickened nails. Pain while ambulating in shoes. Patient is unable to trim their own nails. She states she has a wound on the right great toe that is being treated by the wound center.  Past Medical History:  Diagnosis Date  . Acute heart failure ([place])   . Aortic stenosis   . Complication of anesthesia    Hard to wake patient up after having anesthesia  . Diabetes mellitus without complication ([place])   . Diabetic neuropathy ([facility])    Takes Lyrica  . Edema of both legs    Takes Lasix  . GERD (gastroesophageal reflux disease)   . High cholesterol   . HTN (hypertension)   . Hypertension   . PAD (peripheral artery disease) ([place])   . Shortness of breath dyspnea   . Spasm of back muscles   . Stroke ([place])   . Wound, open    Right great toe    OBJECTIVE General Patient is awake, alert, and oriented x 3 and in no acute distress. Derm Skin is dry and supple bilateral. Negative open lesions or macerations. Remaining integument unremarkable. Nails are tender, long, thickened and dystrophic with subungual debris, consistent with onychomycosis, 1-5 bilateral. No signs of infection noted. Vasc  DP and PT pedal pulses palpable bilaterally. Temperature gradient within normal limits.  Neuro Epicritic and protective threshold sensation diminished bilaterally.  Musculoskeletal Exam No symptomatic pedal deformities noted bilateral. Muscular strength within normal limits.  ASSESSMENT 1. Diabetes Mellitus w/ peripheral neuropathy 2. Onychomycosis of nail due to dermatophyte bilateral 3. Pain in foot bilateral  PLAN OF CARE 1. Patient evaluated today. 2. Instructed to maintain good pedal hygiene and foot care. Stressed importance of controlling blood sugar.  3. Mechanical debridement of nails 1-5 bilaterally performed using a nail nipper. Filed with dremel without incident.  4. Return to  clinic in 3 mos.     Edrick Kins, DPM [facility]  Dr. Edrick Kins, [address]                                        [place], [place] [address]                Office [phone]  Fax [phone]

## 2017-04-30 ENCOUNTER — Encounter: Payer: Medicare HMO | Admitting: Internal Medicine

## 2017-04-30 DIAGNOSIS — E11622 Type 2 diabetes mellitus with other skin ulcer: Secondary | ICD-10-CM | POA: Diagnosis not present

## 2017-05-01 NOTE — Progress Notes (Signed)
ZENDAYAH, HARDGRAVE ([id number]) Visit Report for 04/30/2017 Arrival Information Details Patient Name: Prada, GILBERTA PEETERS. Date of Service: 04/30/2017 10:15 AM Medical Record Number: [id number] Patient Account Number: [id number] Date of Birth/Sex: 05-Jun-1941 (76 y.o. Female) Treating RN: Cornell Barman Primary Care Zenia Guest: Beverlyn Roux Other Clinician: Referring Alleyne Lac: Beverlyn Roux Treating Modestine Scherzinger/Extender: Tito Dine in Treatment: 25 Visit Information History Since Last Visit Added or deleted any medications: No Patient Arrived: Ambulatory Any new allergies or adverse reactions: No Arrival Time: 10:07 Had a fall or experienced change in No Accompanied By: husband activities of daily living that may affect Transfer Assistance: Manual risk of falls: Patient Identification Verified: Yes Signs or symptoms of abuse/neglect No Secondary Verification Process Yes since last visito Completed: Hospitalized since last visit: No Patient Requires Transmission-Based No Has Dressing in Place as Prescribed: Yes Precautions: Has Compression in Place as Yes Patient Has Alerts: No Prescribed: Has Footwear/Offloading in Place as Yes Prescribed: Left: Surgical Shoe with Pressure Relief Insole Right: Surgical Shoe with Pressure Relief Insole Pain Present Now: No Electronic Signature(s) Signed: 04/30/2017 1:35:33 PM By: Gretta Cool, BSN, RN, CWS, Kim RN, BSN Entered By: Gretta Cool, BSN, RN, CWS, Kim on 04/30/2017 10:08:13 Boeve, [name] ([id number]) -------------------------------------------------------------------------------- Clinic Level of Care Assessment Details Patient Name: Dayley, Deshundra V. Date of Service: 04/30/2017 10:15 AM Medical Record Number: [id number] Patient Account Number: [id number] Date of Birth/Sex: Mar 18, 1941 (76 y.o. Female) Treating RN: Cornell Barman Primary Care Anthonymichael Munday: Beverlyn Roux Other Clinician: Referring Reign Bartnick: Beverlyn Roux Treating Namita Yearwood/Extender: Tito Dine in Treatment: 25 Clinic Level of Care Assessment Items TOOL 4 Quantity Score []  - Use when only an EandM is performed on FOLLOW-UP visit 0 ASSESSMENTS - Nursing Assessment / Reassessment []  - Reassessment of Co-morbidities (includes updates in patient status) 0 X - Reassessment of Adherence to Treatment Plan 1 5 ASSESSMENTS - Wound and Skin Assessment / Reassessment X - Simple Wound Assessment / Reassessment - one wound 1 5 []  - Complex Wound Assessment / Reassessment - multiple wounds 0 []  - Dermatologic / Skin Assessment (not related to wound area) 0 ASSESSMENTS - Focused Assessment []  - Circumferential Edema Measurements - multi extremities 0 []  - Nutritional Assessment / Counseling / Intervention 0 []  - Lower Extremity Assessment (monofilament, tuning fork, pulses) 0 []  - Peripheral Arterial Disease Assessment (using hand held doppler) 0 ASSESSMENTS - Ostomy and/or Continence Assessment and Care []  - Incontinence Assessment and Management 0 []  - Ostomy Care Assessment and Management (repouching, etc.) 0 PROCESS - Coordination of Care X - Simple Patient / Family Education for ongoing care 1 15 []  - Complex (extensive) Patient / Family Education for ongoing care 0 X - Staff obtains Programmer, systems, Records, Test Results / Process Orders 1 10 []  - Staff telephones HHA, Nursing Homes / Clarify orders / etc 0 []  - Routine Transfer to another Facility (non-emergent condition) 0 Obey, Lachae V. ([id number]) []  - Routine Hospital Admission (non-emergent condition) 0 []  - New Admissions / Biomedical engineer / Ordering NPWT, Apligraf, etc. 0 []  - Emergency Hospital Admission (emergent condition) 0 X - Simple Discharge Coordination 1 10 []  - Complex (extensive) Discharge Coordination 0 PROCESS - Special Needs []  - Pediatric / Minor Patient Management 0 []  - Isolation Patient Management 0 []  - Hearing / Language / Visual special needs 0 []  - Assessment of Community  assistance (transportation, D/C planning, etc.) 0 []  - Additional assistance / Altered mentation 0 []  - Support Surface(s) Assessment (bed, cushion, seat, etc.) 0 INTERVENTIONS -  Wound Cleansing / Measurement X - Simple Wound Cleansing - one wound 1 5 []  - Complex Wound Cleansing - multiple wounds 0 X - Wound Imaging (photographs - any number of wounds) 1 5 []  - Wound Tracing (instead of photographs) 0 X - Simple Wound Measurement - one wound 1 5 []  - Complex Wound Measurement - multiple wounds 0 INTERVENTIONS - Wound Dressings []  - Small Wound Dressing one or multiple wounds 0 X - Medium Wound Dressing one or multiple wounds 1 15 []  - Large Wound Dressing one or multiple wounds 0 []  - Application of Medications - topical 0 []  - Application of Medications - injection 0 INTERVENTIONS - Miscellaneous []  - External ear exam 0 [name], Breeanne V. ([id number]) []  - Specimen Collection (cultures, biopsies, blood, body fluids, etc.) 0 []  - Specimen(s) / Culture(s) sent or taken to Lab for analysis 0 []  - Patient Transfer (multiple staff / Harrel Lemon Lift / Similar devices) 0 []  - Simple Staple / Suture removal (25 or less) 0 []  - Complex Staple / Suture removal (26 or more) 0 []  - Hypo / Hyperglycemic Management (close monitor of Blood Glucose) 0 []  - Ankle / Brachial Index (ABI) - do not check if billed separately 0 X - Vital Signs 1 5 Has the patient been seen at the hospital within the last three years: Yes Total Score: 80 Level Of Care: New/Established - Level 3 Electronic Signature(s) Signed: 04/30/2017 1:35:33 PM By: Gretta Cool, BSN, RN, CWS, Kim RN, BSN Entered By: Gretta Cool, BSN, RN, CWS, Kim on 04/30/2017 10:31:10 Casselman, [name] ([id number]) -------------------------------------------------------------------------------- Encounter Discharge Information Details Patient Name: Liburd, Milea V. Date of Service: 04/30/2017 10:15 AM Medical Record Number: [id number] Patient Account Number:  [id number] Date of Birth/Sex: [birth year] (76 y.o. Female) Treating RN: Cornell Barman Primary Care Cashtyn Pouliot: Beverlyn Roux Other Clinician: Referring Ziyon Cedotal: Beverlyn Roux Treating Timara Loma/Extender: Tito Dine in Treatment: 25 Encounter Discharge Information Items Discharge Pain Level: 0 Discharge Condition: Stable Ambulatory Status: Wheelchair Discharge Destination: Home Transportation: Private Auto Accompanied By: husband Schedule Follow-up Appointment: Yes Medication Reconciliation completed and provided to Patient/Care Yes Selia Wareing: Provided on Clinical Summary of Care: 04/30/2017 Form Type Recipient Paper Patient [facility] Electronic Signature(s) Signed: 04/30/2017 4:20:16 PM By: Ruthine Dose Entered By: Ruthine Dose on 04/30/2017 10:35:04 Rosser, Armida V. ([id number]) -------------------------------------------------------------------------------- Lower Extremity Assessment Details Patient Name: Gillies, Nhyla V. Date of Service: 04/30/2017 10:15 AM Medical Record Number: [id number] Patient Account Number: [id number] Date of Birth/Sex: Aug 01, 1941 (76 y.o. Female) Treating RN: Cornell Barman Primary Care Tasheena Wambolt: Beverlyn Roux Other Clinician: Referring Ramelo Oetken: Beverlyn Roux Treating Abbegale Stehle/Extender: Tito Dine in Treatment: 25 Vascular Assessment Pulses: Dorsalis Pedis Palpable: [Left:Yes] Posterior Tibial Extremity colors, hair growth, and conditions: Extremity Color: [Left:Hyperpigmented] Hair Growth on Extremity: [Left:Yes] Capillary Refill: [Left:< 3 seconds] Dependent Rubor: [Left:No] Blanched when Elevated: [Left:No] Lipodermatosclerosis: [Left:No] Toe Nail Assessment Left: Right: Thick: Yes Discolored: Yes Deformed: Yes Improper Length and Hygiene: No Electronic Signature(s) Signed: 04/30/2017 1:35:33 PM By: Gretta Cool, BSN, RN, CWS, Kim RN, BSN Entered By: Gretta Cool, BSN, RN, CWS, Kim on 04/30/2017 10:14:15 Hamme, [name]  ([id number]) -------------------------------------------------------------------------------- Multi Wound Chart Details Patient Name: Sigg, Aelyn V. Date of Service: 04/30/2017 10:15 AM Medical Record Number: [id number] Patient Account Number: [id number] Date of Birth/Sex: [birth year] (76 y.o. 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] [N/A:N/A] Date Acquired: [7:08/06/2013] [N/A:N/A] Weeks of Treatment: [7:25] [N/A:N/A] Wound Status: [7:Open] [N/A:N/A] Measurements L x W x D 2x0.6x0.5 [N/A:N/A] (cm) Area (cm) : [7:0.942] [N/A:N/A] Volume (cm) : [7:0.471] [N/A:N/A] % Reduction in Area: [7:20.00%] [N/A:N/A] % Reduction in Volume: -33.40% [N/A:N/A] Classification: [7:Grade 2] [N/A:N/A] Periwound Skin Texture: No Abnormalities Noted [N/A:N/A] Periwound Skin [7:No Abnormalities Noted] [N/A:N/A] Moisture: Periwound Skin Color: No Abnormalities Noted [N/A:N/A] Tenderness on [7:No] [N/A:N/A] Treatment Notes Wound #7 (Right, Dorsal Metatarsal head first) Notes endoform, foam Obeirne, Tierria V. ([id number]) Electronic Signature(s) Signed: 04/30/2017 4:28:59 PM By: Linton Ham MD Entered By: Linton Ham on 04/30/2017 10:43:30 Stickler, [name] ([id number]) -------------------------------------------------------------------------------- Multi-Disciplinary Care Plan Details Patient Name: Solar, Kang V. Date of Service: 04/30/2017 10:15 AM Medical Record  Number: [id number] Patient Account Number: [id number] Date of Birth/Sex: 09-29-1940 (76 y.o. Female) Treating RN: Cornell Barman Primary Care Juell Radney: Beverlyn Roux Other Clinician: Referring Tramar Brueckner: Beverlyn Roux Treating Tamarra Geiselman/Extender: Tito Dine in Treatment: 25 Active Inactive ` Abuse / Safety / Falls / Self Care Management Nursing Diagnoses: Impaired physical mobility Potential for falls Goals: Patient will remain injury free Date Initiated: 11/06/2016 Target Resolution Date: 12/30/2016 Goal Status: Active Patient/caregiver will verbalize understanding of skin care regimen Date Initiated: 11/06/2016 Target Resolution Date: 12/30/2016 Goal Status: Active Interventions: Assess fall risk on admission and as needed Treatment Activities: Patient referred to home care : 11/06/2016 Notes: ` Nutrition Nursing Diagnoses: Potential for alteratiion in Nutrition/Potential for imbalanced nutrition Goals: Patient/caregiver verbalizes understanding of need to maintain therapeutic glucose control per primary care physician Date Initiated: 11/06/2016 Target Resolution Date: 12/30/2016 Goal Status: Active Interventions: Provide education on elevated blood sugars and impact on wound healing Romberger, Quentin V. ([id number]) Notes: ` Orientation to the Wound Care Program Nursing Diagnoses: Knowledge deficit related to the wound healing center program Goals: Patient/caregiver will verbalize understanding of the [place] Date Initiated: 11/06/2016 Target Resolution Date: 12/30/2016 Goal Status: Active Interventions: Provide education on orientation to the wound center Notes: ` Venous Leg Ulcer Nursing Diagnoses: Actual venous Insuffiency (use after diagnosis is confirmed) Knowledge deficit related to disease process and management Goals: Non-invasive venous studies are completed as ordered Date Initiated: 11/06/2016 Target Resolution Date: 12/30/2016 Goal Status:  Active Patient/caregiver will verbalize understanding of disease process and disease management Date Initiated: 11/06/2016 Target Resolution Date: 12/30/2016 Goal Status: Active Interventions: Assess peripheral edema status every visit. Notes: ` Wound/Skin Impairment Nursing Diagnoses: Impaired tissue integrity Knowledge deficit related to smoking impact on wound healing Knowledge deficit related to ulceration/compromised skin integrity Goals: Dills, Troyce V. ([id number]) Ulcer/skin breakdown will heal within 14 weeks Date Initiated: 11/06/2016 Target Resolution Date: 02/05/2017 Goal Status: Active Interventions: Assess ulceration(s) every visit Treatment Activities: Skin care regimen initiated : 11/06/2016 Notes: Electronic Signature(s) Signed: 04/30/2017 1:35:33 PM By: Gretta Cool, BSN, RN, CWS, Kim RN, BSN Entered By: Gretta Cool, BSN, RN, CWS, Kim on 04/30/2017 10:21:13 Gearin, [name] ([id number]) -------------------------------------------------------------------------------- Pain Assessment Details Patient Name: Derocher, Shatika V. Date of Service: 04/30/2017 10:15 AM Medical Record Number: [id number] Patient Account Number: [id number] Date of Birth/Sex: November 18, 1940 (77 y.o. Female) Treating RN: Cornell Barman Primary Care Deepti Gunawan: Beverlyn Roux Other Clinician: Referring Tawona Filsinger: Beverlyn Roux Treating Reginald Mangels/Extender: Tito Dine in Treatment: 25 Active Problems Location of Pain Severity and Description of Pain Patient Has  Paino No Site Locations With Dressing Change: No Pain Management and Medication Current Pain Management: Goals for Pain Management Topical or injectable lidocaine is offered to patient for acute pain when surgical debridement is performed. If needed, Patient is instructed to use over the counter pain medication for the following 24-48 hours after debridement. Wound care MDs do not prescribed pain medications. Patient has chronic pain or uncontrolled pain.  Patient has been instructed to make an appointment with their Primary Care Physician for pain management. Electronic Signature(s) Signed: 04/30/2017 1:35:33 PM By: Gretta Cool, BSN, RN, CWS, Kim RN, BSN Entered By: Gretta Cool, BSN, RN, CWS, Kim on 04/30/2017 10:08:33 Blaize, [name] ([id number]) -------------------------------------------------------------------------------- Patient/Caregiver Education Details Patient Name: Gafford, Kyah V. Date of Service: 04/30/2017 10:15 AM Medical Record Patient Account Number: [id number] Number: Treating RN: Cornell Barman Feb 22, 1941 (76 y.o. Other Clinician: Date of Birth/Gender: Female) Treating ROBSON, [name] Primary Care Physician: Beverlyn Roux Physician/Extender: G Referring Physician: Dorthula Nettles in Treatment: 25 Education Assessment Education Provided To: Patient Education Topics Provided Wound/Skin Impairment: Handouts: Caring for Your Ulcer, Other: continue wound care as prescribed Methods: Demonstration Responses: State content correctly Electronic Signature(s) Signed: 04/30/2017 1:35:33 PM By: Gretta Cool, BSN, RN, CWS, Kim RN, BSN Entered By: Gretta Cool, BSN, RN, CWS, Kim on 04/30/2017 10:34:17 Blankenhorn, [name] ([id number]) -------------------------------------------------------------------------------- Wound Assessment Details Patient Name: Netz, Kamira V. Date of Service: 04/30/2017 10:15 AM Medical Record Number: [id number] Patient Account Number: [id number] Date of Birth/Sex: 10-17-1940 (76 y.o. Female) Treating RN: Cornell Barman Primary Care Melva Faux: Beverlyn Roux Other Clinician: Referring Jaasia Viglione: Beverlyn Roux Treating Candia Kingsbury/Extender: Tito Dine in Treatment: 25 Wound Status Wound Number: 7 Primary Diabetic Wound/Ulcer of the Lower Etiology: Extremity Wound Location: Right, Dorsal Metatarsal head first Wound Status: Open Wounding Event: Gradually Appeared Date Acquired: 08/06/2013 Weeks Of Treatment: 25 Clustered  Wound: No Photos Photo Uploaded By: Gretta Cool, BSN, RN, CWS, Kim on 04/30/2017 11:12:21 Wound Measurements Length: (cm) 2 Width: (cm) 0.6 Depth: (cm) 0.5 Area: (cm) 0.942 Volume: (cm) 0.471 % Reduction in Area: 20% % Reduction in Volume: -33.4% Wound Description Classification: Grade 2 Periwound Skin Texture Texture Color No Abnormalities Noted: No No Abnormalities Noted: No Moisture No Abnormalities Noted: No Treatment Notes Wound #7 (Right, Dorsal Metatarsal head first) Notes endoform, foam Kernes, Mykell V. ([id number]) Electronic Signature(s) Signed: 04/30/2017 1:35:33 PM By: Gretta Cool, BSN, RN, CWS, Kim RN, BSN Entered By: Gretta Cool, BSN, RN, CWS, Kim on 04/30/2017 10:12:53 Wissink, [name] ([id number]) -------------------------------------------------------------------------------- [place] Details Patient Name: Havey, Ajwa V. Date of Service: 04/30/2017 10:15 AM Medical Record Number: [id number] Patient Account Number: [id number] Date of Birth/Sex: [birth year] (76 y.o. Female) Treating RN: Cornell Barman Primary Care Jr Milliron: Beverlyn Roux Other Clinician: Referring Jordyne Poehlman: Beverlyn Roux Treating Natori Gudino/Extender: Tito Dine in Treatment: 25 Vital Signs Time Taken: 10:08 Temperature (F): 98.0 Height (in): 64 Pulse (bpm): 63 Weight (lbs): 200 Respiratory Rate (breaths/min): 16 Body Mass Index (BMI): 34.3 Blood Pressure (mmHg): 140/60 Reference Range: 80 - 120 mg / dl Electronic Signature(s) Signed: 04/30/2017 1:35:33 PM By: Gretta Cool, BSN, RN, CWS, Kim RN, BSN Entered By: Gretta Cool, BSN, RN, CWS, Kim on 04/30/2017 10:09:11

## 2017-05-01 NOTE — Progress Notes (Signed)
[name], [name] ([id number]) Visit Report for 04/30/2017 HPI Details Patient Name: [name], [name] Date of Service: 04/30/2017 10:15 AM Medical Record Patient Account Number: [id number] Number: Treating RN: [name] 1941/03/25 (76 y.o. Other Clinician: Date of Birth/Sex: Female) Treating [name] Primary Care Provider: Beverlyn Roux Provider/Extender: G Referring Provider: Dorthula Nettles in Treatment: 25 History of Present Illness HPI Description: 08/13/16: This is a 76 year old woman who came predominantly for review of 3 cm in diameter circular wound to the left anterior lateral leg. She was in the ER on 08/01/16 I reviewed their notes. There was apparently pus coming out of the wound at that time and the patient arrived requesting debridement which they don't do in the emergency room. Nevertheless I can't see that they did any x-rays. There were no cultures done. She is a type II diabetic and I a note after the patient was in the clinic that she had a bypass graft from the popliteal to the tibial on the right on 02/28/16. She also had a right greater saphenous vein harvest on the same date for arterial bypass. She is going to have vascular studies including ABIs T ABIs on the right on 08/28/16. The patient's surgery was on 02/28/16 by Dr. Vallarie Mare she had a right below the knee popliteal artery to peroneal artery bypass with reverse greater saphenous vein and an endarterectomy of the mid segment peroneal artery. Postoperatively she had a strong mild monophasic peroneal signal with a pink foot. It would appear that the patient is had some nonhealing in the surgical saphenous vein harvest site on the left leg. Surprisingly looking [facility] I cannot see much information about this at all. Dr. Lucious Groves notes from 05/29/16 show that the patient's wounds "are not healed" the right first metatarsal wound healed but then opened back up. 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 At that point the patient had a wound VAC to her right calf with regards to the wounds on her dorsal right toe would appear that these are felt to be arterial wounds. She has had surgery on the metatarsal phalangeal in 2015 I Dr. Doran Durand secondary to a right metatarsal phalangeal joint fracture. She is apparently had discoloration around this area since then. 08/28/16; patient arrives with her wounds in much the same condition. The linear vein harvest site and the circular wound below it which I think was a blister. She also has to probing holes in her right great toe and a necrotic eschar on the right second toe. Because of these being arterial wounds I reduced her compression from 3-2 layers this seems to of done satisfactorily she has not had any problems. I cannot see that she is actually had an x-ray ====== 11/06/16 the patient comes in for evaluation of her right lower extremity ulcers. She was here in January 2018 for 2 visits subsequently ended up in the hospital with pneumonia and then to rehabilitation. She has now been discharged from rehabilitation and is home. She has multiple ulcerations to her right lower extremity including the foot and toes. She does have home health in place and they have been placing alginate to the Sprigg, [name] ([id number]) ulcers. She is followed by Dr. Bridgett Larsson of vascular medicine. 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 She recently saw him on 3/23.  In office ABIs were: Right 0.48 with monophasic flow to the DP, PT, peroneal Left 0.63 with monophasic flow to the DP and PT Her arterial studies indicated a patent right below knee popliteal to peroneal bypass She had an MRI in February 2008 that was negative frosty myelitis but this showed general soft tissue edema in the right  foot and lower extremity concerning for cellulitis She is a diabetic, managed with insulin. Her hemoglobin A1c in December 2017 was 8.4 which is a trend up from previous levels. She had blood work in February 2018 which revealed an albumin of 2.6 this appears to be relatively acute as an albumin in November 2017 was 3.7 11/13/16; this is a patient I have not seen since February who is readmitted to our clinic last week. 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 Bridgett Larsson. I have reviewed Dr. Lianne Moris notes from March/23/18. Doppler ABI on that date showed an ABI on the right of 0.48 and on the left of 0.63. Dorsalis pedis waveforms were monophasic bilaterally. There was no waveforms detected at the posterior tibial on the right, monophasic on the left. Dr. Lianne Moris comments were that this patient would have follow-up vascular studies in 3 months including ABIs and right lower extremity arterial duplex. She had an MRI in February that was negative for osteomyelitis but showed generalized edema in the foot. Last albumin I see was in January at 3.4 we have been using Santyl to the 4 wounds on the right leg the patient is noted today to have widespread edema well up towards her groin this is pitting 2-3+. 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 Normal ejection fraction. 11/20/16; patient has a follow-up appointment with Dr. Bridgett Larsson on April 23. She is still complaining of a lot of pain in the right foot and right leg. It is not clear to me that this is at all positional however I think it is clear claudication with minimal activity perhaps at rest. At our suggestion she is return to her primary physician's office tomorrow with regards to her pitting lower extremity bilateral edema that I reviewed in detail last week 11/27/16; the follow-up with Dr. Bridgett Larsson was actually on May 23 on April  23 as I stated in my note last week. N/A case all of her wounds seems somewhat smaller. The 2 on the right leg are definitely smaller. The areas on the dorsal right first toe, right third toe and the lateral part of the right fifth metatarsal head all looks smaller but have tightly adherent surfaces. We have been using Santyl 12/04/16; follow-up with Dr. Bridgett Larsson on May 23. 2 small open wounds on the right leg continue to get smaller. The area on the right third total lateral aspect of the right fifth toe also look better. The remaining area on the dorsal first toe still has some depth to it. We have been using Santyl to the toes and collagen on the right 12/11/16; according to patient's husband the follow-up with Dr. Bridgett Larsson is not until July. All of the wounds on the right leg are measuring smaller. We have been using some combination of Prisma and Santyl although I think we can go to straight Prisma today. There may have been some confusion with home health about the primary dressing orders here. 12/25/16; the patient has had some healing this week. The area on her right  lateral fifth metatarsal head, right third toe are both healed lower right leg is healed. In the vein harvest site superiorly she has one superficial open area. On the dorsal aspect of her right great toe/MTP joint the wound is now divided into 2 however the proximal area is deep and there is palpable bone 01/01/17; still an open area in the middle of her original right surgical scar. The area on the right third toe and right lateral fifth metatarsal head remained closed. Problematic area on the dorsal aspect of the toe. Previous surgery in this area line 01/08/17 small open area on the original scar on her upper anterior leg although this is closing. X-ray I did of the right first toe did not show underlying bony abnormality. Still this area on the dorsal first toe probes to bone. We have been using [place], Almedia V.  ([id number]) 01/15/17; small open area in the original scar in the upper anterior leg is almost fully closed. She has 2 open areas over the dorsal aspect of the right first toe that probes to bone. Used and a form starting last week. Vigorous bone scraping that I did last week showed few methicillin sensitive staph aureus. She is allergic to penicillin and sulfa drugs. I'm going to give her 2 weeks of doxycycline. She will need an MRI with contrast. 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 She continues on doxycycline for a bone scraping that showed methicillin sensitive staph aureus [allergic to penicillin and sulfa]. We have been using Endo form to the wound. 01/29/17; surprisingly her MRI did not show osteomyelitis. She continues on doxycycline for a bone scraping that showed methicillin sensitive staph aureus with allergies to penicillin and sulfa. We have been using Endo form to the wound. Unfortunately I cannot get a surface on this visit looks like it is able to support a healing state. Proximally there is still exposed bone. There is no overt soft tissue tenderness. Her MRI did show a previous fracture was surgery to this area but no hardware 02/12/17 on evaluation today patient appears to be doing well in regard to her lower extremity wound. She does have some mild discomfort but this is minimal. There's no evidence of infection. Her left great toe nail has somewhat been lifted up and there is a little bit of slight bleeding underneath but this is still firmly attached. 02/19/17; she ran out of doxycycline 2 days ago. Nevertheless I would like to continue this for 2 weeks to make a full 6 weeks of therapy as the bone scraping that I did from the open area on the dorsal right first toe showed MRSA. This should complete antibiotic therapy. 02/26/17; the patient is completing her 6 weeks of oral doxycycline. Deep  wound on the dorsal right toe. This still has some exposed bone proximally. She does have a reasonably granulated surface albeit thin over bone. I've been using Endo form have made application for an amniotic skin sub 03/05/17; the patient has completed 6 weeks of doxycycline. This is a deep wound on the dorsal right toe which has exposed bone proximally. She has granulation over most of this wound albeit a thin layer. I've been using Endo form. Still do not have approval for [employer] 03/13/17 on evaluation today patient's right foot wound appears to be doing better measurement wise compared to her last evaluation. She has been tolerating the dressing  change without complication and does have a appointment with the dietary that has been made as far as referral is concerned there just waiting for her and transportation to contact them back for an actual date. Nonetheless patient has been having less pain at this point. No fevers, chills, nausea, or vomiting noted at this time. 03/26/17; linear wound over the dorsal aspect of her right great toe. At one point this had exposed bone on the most superior aspect however I am pleased to see today that this appears to have a surface of granulation. Apparently product was applied for but denied by [facility]. We'll need to see what that was. The patient has known PAD status post revascularization. MRI did not show osteomyelitis which was done in June 04/02/17; continued improvement using Endoform. She was approved for [facility] but hasn't over $213 co-pay per application, this is beyond her means 04/09/17; continues on Endoform. 04/16/17; appears to be doing nicely continuing on Endoform 04/22/17; patient did not have her dressing changed all week because of the weather. Using Endoform. Base of the wound looks healthy elbow there appears to events surrounding maceration perhaps because of the drainage was not changing the wound dressing 04/30/17 wound today continues to  close and except for a small divot on the superior part of the wound. This area did not probe the bone but I found this a little concerning as this was the area with exposed bone before we be able to get this to granulate forward. As I remember things she is not a candidate for skin substitutes secondary to an outlandish co-pay. I asked her husband to look into the out of pocket max for medications if he has 1 [Regranex] or totally for skin substitutes Electronic Signature(s) DAVIONNE, MASTRANGELO ([id number]) Signed: 04/30/2017 4:28:59 PM By: Linton Ham MD Entered By: Linton Ham on 04/30/2017 10:45:20 Espino, [name] ([id number]) -------------------------------------------------------------------------------- Physical Exam Details Patient Name: Lynn, Gayatri V. Date of Service: 04/30/2017 10:15 AM Medical Record Patient Account Number: [id number] Number: Treating RN: [name] [birth year] (76 y.o. Other Clinician: Date of Birth/Sex: Female) Treating Percival Glasheen Primary Care Provider: Beverlyn Roux Provider/Extender: G Referring Provider: Dorthula Nettles in Treatment: 25 Constitutional Sitting or standing Blood Pressure is within target range for patient.. Pulse regular and within target range for patient.[name] Respirations regular, non-labored and within target range.. Temperature is normal and within the target range for the patient.[name] appears in no distress. Eyes Conjunctivae clear. No discharge. Respiratory Respiratory effort is easy and symmetric bilaterally. Rate is normal at rest and on room air.. Cardiovascular Pedal pulses absent bilaterally.. Edema present however it appears to be under control. Lymphatic None palpable in the popliteal or inguinal area. Notes Wound exam; linear wound on the dorsal aspect of left great toe. No debridement was necessary today. She does have a deeper divot imperially and the wound which is somewhat worse than I remember  last week. This is also the area that at one point had exposed bone before we begin to granulate the surface. No evidence of surrounding infection, there was no drainage. Measurements somewhat better Electronic Signature(s) Signed: 04/30/2017 4:28:59 PM By: Linton Ham MD Entered By: Linton Ham on 04/30/2017 10:48:01 Losano, [name] ([id number]) -------------------------------------------------------------------------------- Physician Orders Details Patient Name: Pulver, [place] V. Date of Service: 04/30/2017 10:15 AM Medical Record Patient Account Number: [id number] Number: Treating RN: [name] Dec 09, 1940 (76 y.o. Other Clinician: Date of Birth/Sex: Female) Treating Sharia Averitt Primary Care Provider: Beverlyn Roux Provider/Extender: Darnell Level  Referring Provider: Dorthula Nettles in Treatment: 57 Verbal / Phone Orders: No Diagnosis Coding Wound Cleansing Wound #7 Right,Dorsal Metatarsal head first o Clean wound with Normal Saline. o Cleanse wound with mild soap and water Anesthetic Wound #7 Right,Dorsal Metatarsal head first o Topical Lidocaine 4% cream applied to wound bed prior to debridement Primary Wound Dressing Wound #7 Right,Dorsal Metatarsal head first o Other: - ENDOFORM Secondary Dressing Wound #7 Right,Dorsal Metatarsal head first o Dry Gauze o Conform/Kerlix o Drawtex Dressing Change Frequency Wound #7 Right,Dorsal Metatarsal head first o Change Dressing Monday, Wednesday, Friday Follow-up Appointments Wound #7 Right,Dorsal Metatarsal head first o Return Appointment in 1 week. Edema Control Wound #7 Right,Dorsal Metatarsal head first o Elevate legs to the level of the heart and pump ankles as often as possible Additional Orders / Instructions Wound #7 Right,Dorsal Metatarsal head first [name], [place] V. ([id number]) o Increase protein intake. o Activity as tolerated Home Health Wound #7 Right,Dorsal Metatarsal head  first o Continue Home Health Visits - [facility] weekly, [facility] o Home Health Nurse may visit PRN to address patientos wound care needs. o FACE TO FACE ENCOUNTER: MEDICARE and MEDICAID PATIENTS: I certify that this patient is under my care and that I had a face-to-face encounter that meets the physician face-to-face encounter requirements with this patient on this date. The encounter with the patient was in whole or in part for the following MEDICAL CONDITION: (primary reason for [place]) MEDICAL NECESSITY: I certify, that based on my findings, NURSING services are a medically necessary home health service. HOME BOUND STATUS: I certify that my clinical findings support that this patient is homebound (i.e., Due to illness or injury, pt requires aid of supportive devices such as crutches, cane, wheelchairs, walkers, the use of special transportation or the assistance of another person to leave their place of residence. There is a normal inability to leave the home and doing so requires considerable and taxing effort. Other absences are for medical reasons / religious services and are infrequent or of short duration when for other reasons). o If current dressing causes regression in wound condition, may D/C ordered dressing product/s and apply Normal Saline Moist Dressing daily until next [place] / Other MD appointment. [place] of regression in wound condition at [phone]. o Please direct any NON-WOUND related issues/requests for orders to patient's Primary Care Physician Electronic Signature(s) Signed: 04/30/2017 1:35:33 PM By: Gretta Cool, BSN, RN, CWS, Kim RN, BSN Signed: 04/30/2017 4:28:59 PM By: Linton Ham MD Entered By: Gretta Cool, BSN, RN, CWS, Kim on 04/30/2017 10:30:01 Italiano, [name] ([id number]) -------------------------------------------------------------------------------- Problem List Details Patient Name:  Roger, Selenia V. Date of Service: 04/30/2017 10:15 AM Medical Record Patient Account Number: [id number] Number: Treating RN: [name] Mar 12, 1941 (76 y.o. 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 ([id number]) -------------------------------------------------------------------------------- Progress Note Details Patient Name: [place], Roselie V. Date of Service: 04/30/2017 10:15 AM Medical Record Patient Account Number: [id number] Number: Treating RN: [name] [birth year] (76 y.o. Other Clinician: Date of Birth/Sex: Female) Treating Khali Perella Primary Care Provider: Beverlyn Roux Provider/Extender: G Referring Provider: Dorthula Nettles in Treatment: 25 Subjective History of Present Illness (HPI) 08/13/16: This is a 76 year old woman who came predominantly for review of 3 cm in diameter circular wound to the left anterior lateral leg.  She was in the ER on 08/01/16 I reviewed their notes. There was apparently pus coming out of the wound at that time and the patient arrived requesting debridement which they don't do in the emergency room. Nevertheless I can't see that they did any x-rays. There were no cultures done. She is a type II diabetic and I a note after the patient was in the clinic that she had a bypass graft from the popliteal to the tibial on the right on 02/28/16. She also had a right greater saphenous vein harvest on the same date for arterial bypass. She is going to have vascular studies including ABIs T ABIs on the right on 08/28/16. The patient's surgery was on 02/28/16 by Dr. Vallarie Mare she had a right below the knee popliteal artery to peroneal artery bypass with reverse greater saphenous vein and an endarterectomy of the mid segment peroneal artery. Postoperatively she had a strong mild monophasic peroneal signal with a pink foot. It would appear that the patient is had some nonhealing in the surgical saphenous vein harvest site on the left leg. Surprisingly looking [facility] I cannot see much information about this at all. Dr. Lucious Groves notes from 05/29/16 show that the patient's wounds "are not healed" the right first metatarsal wound healed but then opened back up. 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 At that point the patient had a wound VAC to her right calf with regards to the wounds on her dorsal right toe would appear that these are felt to be arterial wounds. She has had surgery on the metatarsal phalangeal in 2015 I Dr. Doran Durand secondary to a right metatarsal phalangeal joint fracture. She is apparently had discoloration around this area since then. 08/28/16; patient arrives with her wounds in much the same condition. The linear vein harvest site and the circular wound below it which I think  was a blister. She also has to probing holes in her right great toe and a necrotic eschar on the right second toe. Because of these being arterial wounds I reduced her compression from 3-2 layers this seems to of done satisfactorily she has not had any problems. I cannot see that she is actually had an x-ray ====== 11/06/16 the patient comes in for evaluation of her right lower extremity ulcers. She was here in January 2018 for 2 visits subsequently ended up in the hospital with pneumonia and then to rehabilitation. She has now been discharged from rehabilitation and is home. She has multiple ulcerations to her right lower extremity including the foot and toes. She does have home health in place and they have been placing alginate to the ulcers. She is followed by Dr. Bridgett Larsson of vascular medicine. She is status  post a bypass graft to the right below knee popliteal to peroneal using reverse GSV in July 2017. She recently saw him on 3/23. In office ABIs were: [name], [name] ([id number]) Right 0.48 with monophasic flow to the DP, PT, peroneal Left 0.63 with monophasic flow to the DP and PT Her arterial studies indicated a patent right below knee popliteal to peroneal bypass She had an MRI in February 2008 that was negative frosty myelitis but this showed general soft tissue edema in the right foot and lower extremity concerning for cellulitis She is a diabetic, managed with insulin. Her hemoglobin A1c in December 2017 was 8.4 which is a trend up from previous levels. She had blood work in February 2018 which revealed an albumin of 2.6 this appears to be relatively acute as an albumin in November 2017 was 3.7 11/13/16; this is a patient I have not seen since February who is readmitted to our clinic last week. 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 Bridgett Larsson. I have reviewed Dr. Lianne Moris notes from March/23/18. Doppler ABI on that date  showed an ABI on the right of 0.48 and on the left of 0.63. Dorsalis pedis waveforms were monophasic bilaterally. There was no waveforms detected at the posterior tibial on the right, monophasic on the left. Dr. Lianne Moris comments were that this patient would have follow-up vascular studies in 3 months including ABIs and right lower extremity arterial duplex. She had an MRI in February that was negative for osteomyelitis but showed generalized edema in the foot. Last albumin I see was in January at 3.4 we have been using Santyl to the 4 wounds on the right leg the patient is noted today to have widespread edema well up towards her groin this is pitting 2-3+. 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 Normal ejection fraction. 11/20/16; patient has a follow-up appointment with Dr. Bridgett Larsson on April 23. She is still complaining of a lot of pain in the right foot and right leg. It is not clear to me that this is at all positional however I think it is clear claudication with minimal activity perhaps at rest. At our suggestion she is return to her primary physician's office tomorrow with regards to her pitting lower extremity bilateral edema that I reviewed in detail last week 11/27/16; the follow-up with Dr. Bridgett Larsson was actually on May 23 on April 23 as I stated in my note last week. N/A case all of her wounds seems somewhat smaller. The 2 on the right leg are definitely smaller. The areas on the dorsal right first toe, right third toe and the lateral part of the right fifth metatarsal head all looks smaller but have tightly adherent surfaces. We have been using Santyl 12/04/16; follow-up with Dr. Bridgett Larsson on May 23. 2 small open wounds on the right leg continue to get smaller. The area on the right third total lateral aspect of the right fifth toe also look better. The remaining area on the dorsal first toe still has some depth to it. We have been using  Santyl to the toes and collagen on the right 12/11/16; according to patient's husband the follow-up with Dr. Bridgett Larsson is not until July. All of the wounds on the right leg are measuring smaller. We have been using some combination of Prisma and Santyl although I think we can go to straight Prisma today. There  may have been some confusion with home health about the primary dressing orders here. 12/25/16; the patient has had some healing this week. The area on her right lateral fifth metatarsal head, right third toe are both healed lower right leg is healed. In the vein harvest site superiorly she has one superficial open area. On the dorsal aspect of her right great toe/MTP joint the wound is now divided into 2 however the proximal area is deep and there is palpable bone 01/01/17; still an open area in the middle of her original right surgical scar. The area on the right third toe and right lateral fifth metatarsal head remained closed. Problematic area on the dorsal aspect of the toe. Previous surgery in this area line 01/08/17 small open area on the original scar on her upper anterior leg although this is closing. X-ray I did of the right first toe did not show underlying bony abnormality. Still this area on the dorsal first toe probes to bone. We have been using Prisma 01/15/17; small open area in the original scar in the upper anterior leg is almost fully closed. She has 2 open areas over the dorsal aspect of the right first toe that probes to bone. Used and a form starting last week. Vigorous bone scraping that I did last week showed few methicillin sensitive staph aureus. She is allergic to [name], [name] ([id number]) penicillin and sulfa drugs. I'm going to give her 2 weeks of doxycycline. She will need an MRI with contrast. 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 She continues on doxycycline for a bone scraping that  showed methicillin sensitive staph aureus [allergic to penicillin and sulfa]. We have been using Endo form to the wound. 01/29/17; surprisingly her MRI did not show osteomyelitis. She continues on doxycycline for a bone scraping that showed methicillin sensitive staph aureus with allergies to penicillin and sulfa. We have been using Endo form to the wound. Unfortunately I cannot get a surface on this visit looks like it is able to support a healing state. Proximally there is still exposed bone. There is no overt soft tissue tenderness. Her MRI did show a previous fracture was surgery to this area but no hardware 02/12/17 on evaluation today patient appears to be doing well in regard to her lower extremity wound. She does have some mild discomfort but this is minimal. There's no evidence of infection. Her left great toe nail has somewhat been lifted up and there is a little bit of slight bleeding underneath but this is still firmly attached. 02/19/17; she ran out of doxycycline 2 days ago. Nevertheless I would like to continue this for 2 weeks to make a full 6 weeks of therapy as the bone scraping that I did from the open area on the dorsal right first toe showed MRSA. This should complete antibiotic therapy. 02/26/17; the patient is completing her 6 weeks of oral doxycycline. Deep wound on the dorsal right toe. This still has some exposed bone proximally. She does have a reasonably granulated surface albeit thin over bone. I've been using Endo form have made application for an amniotic skin sub 03/05/17; the patient has completed 6 weeks of doxycycline. This is a deep wound on the dorsal right toe which has exposed bone proximally. She has granulation over most of this wound albeit a thin layer. I've been using Endo form. Still do not have approval for  [facility] 03/13/17 on evaluation today patient's right foot wound appears to be doing better measurement wise compared to her last evaluation. She has  been tolerating the dressing change without complication and does have a appointment with the dietary that has been made as far as referral is concerned there just waiting for her and transportation to contact them back for an actual date. Nonetheless patient has been having less pain at this point. No fevers, chills, nausea, or vomiting noted at this time. 03/26/17; linear wound over the dorsal aspect of her right great toe. At one point this had exposed bone on the most superior aspect however I am pleased to see today that this appears to have a surface of granulation. Apparently product was applied for but denied by [facility] We'll need to see what that was. The patient has known PAD status post revascularization. MRI did not show osteomyelitis which was done in June 04/02/17; continued improvement using Endoform. She was approved for [facility] but hasn't over $810 co-pay per application, this is beyond her means 04/09/17; continues on Endoform. 04/16/17; appears to be doing nicely continuing on Endoform 04/22/17; patient did not have her dressing changed all week because of the weather. Using Endoform. Base of the wound looks healthy elbow there appears to events surrounding maceration perhaps because of the drainage was not changing the wound dressing 04/30/17 wound today continues to close and except for a small divot on the superior part of the wound. This area did not probe the bone but I found this a little concerning as this was the area with exposed bone before we be able to get this to granulate forward. As I remember things she is not a candidate for skin substitutes secondary to an outlandish co-pay. I asked her husband to look into the out of pocket max for medications if he has 1 [Regranex] or totally for skin substitutes [name], [name] ([id number]) Objective Constitutional Sitting or standing Blood Pressure is within target range for patient.. Pulse regular and within target range for  patient.[name] Respirations regular, non-labored and within target range.. Temperature is normal and within the target range for the patient.[name] appears in no distress. Vitals Time Taken: 10:08 AM, Height: 64 in, Weight: 200 lbs, BMI: 34.3, Temperature: 98.0 F, Pulse: 63 bpm, Respiratory Rate: 16 breaths/min, Blood Pressure: 140/60 mmHg. Eyes Conjunctivae clear. No discharge. Respiratory Respiratory effort is easy and symmetric bilaterally. Rate is normal at rest and on room air.. Cardiovascular Pedal pulses absent bilaterally.. Edema present however it appears to be under control. Lymphatic None palpable in the popliteal or inguinal area. General Notes: Wound exam; linear wound on the dorsal aspect of left great toe. No debridement was necessary today. She does have a deeper divot imperially and the wound which is somewhat worse than I remember last week. This is also the area that at one point had exposed bone before we begin to granulate the surface. No evidence of surrounding infection, there was no drainage. Measurements somewhat better Integumentary (Hair, Skin) Wound #7 status is Open. Original cause of wound was Gradually Appeared. The wound is located on the Right,Dorsal Metatarsal head first. The wound measures 2cm length x 0.6cm width x 0.5cm depth; 0.942cm^2 area and 0.471cm^3 volume. 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 Cleanse wound with mild soap and water Anesthetic: Wound #7 Right,Dorsal Metatarsal head first: Topical Lidocaine 4% cream applied to wound bed prior to debridement Primary Wound Dressing: Wound #7 Right,Dorsal Metatarsal head first: Other: - ENDOFORM Secondary Dressing: Wound #7 Right,Dorsal Metatarsal head first: Dry Gauze Conform/Kerlix Drawtex Dressing Change Frequency: Wound #7 Right,Dorsal Metatarsal head first: Change Dressing Monday, Wednesday, Friday Follow-up Appointments: Wound #7 Right,Dorsal Metatarsal head first: Return Appointment in 1 week. Edema Control: Wound #7 Right,Dorsal Metatarsal head first: Elevate legs to the level of the heart and pump ankles as often as possible Additional Orders / Instructions: Wound #7 Right,Dorsal Metatarsal head first: Increase protein intake. Activity as tolerated Home Health: Wound #7 Right,Dorsal Metatarsal head first: Box Visits - [facility] twice weekly, [facility] Home Health Nurse may visit [facility] to address patient s wound care needs. FACE TO FACE ENCOUNTER: MEDICARE and MEDICAID PATIENTS: I certify that this patient is under my care and that I had a face-to-face encounter that meets the physician face-to-face encounter requirements with this patient on this date. The encounter with the patient was in whole or in part for the following MEDICAL CONDITION: (primary reason for [place]) MEDICAL NECESSITY: I certify, that based on my findings, NURSING services are a medically necessary home health service. HOME BOUND STATUS: I certify that my clinical findings support that this patient is homebound (i.e., Due to illness or injury, pt requires aid of supportive devices such as crutches, cane, wheelchairs, walkers, the use of special transportation or the assistance of another person to  leave their place of residence. There is a [name], [name] ([id number]) normal inability to leave the home and doing so requires considerable and taxing effort. Other absences are for medical reasons / religious services and are infrequent or of short duration when for other reasons). If current dressing causes regression in wound condition, may D/C ordered dressing product/s and apply Normal Saline Moist Dressing daily until next [place] / Other MD appointment. [place] of regression in wound condition at [phone]. Please direct any NON-WOUND related issues/requests for orders to patient's Primary Care Physician #1 I'm going to continue with Endoform this week #2 careful follow-up of the divot superiorly and the wound #3 have asked the husband to look into the out-of-pocket max situation to see if she might be a candidate for skin substitutes Electronic Signature(s) Signed: 04/30/2017 4:28:59 PM By: Linton Ham MD Entered By: Linton Ham on 04/30/2017 10:49:07 Wagler, [name] ([id number]) -------------------------------------------------------------------------------- [place] Details Patient Name: Mandel, Terrea V. Date of Service: 04/30/2017 Medical Record Patient Account Number: [id number] Number: Treating RN: [name] August 18, 1940 (76 y.o. 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 E11.52 Type 2 diabetes mellitus with diabetic peripheral angiopathy  with gangrene I70.232  Atherosclerosis 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 of calf I70.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 of foot Facility Procedures CPT4 Code: 16109604 Description: 99213 - WOUND CARE VISIT-LEV 3 EST PT Modifier: Quantity: 1 Physician Procedures CPT4: Description Modifier Quantity Code [id number] - WC PHYS LEVEL 3 - EST PT 1 ICD-10 Description Diagnosis E11.621 Type 2 diabetes mellitus with foot ulcer 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

## 2017-05-07 ENCOUNTER — Encounter: Payer: Medicare HMO | Attending: Internal Medicine | Admitting: Internal Medicine

## 2017-05-07 DIAGNOSIS — Z880 Allergy status to penicillin: Secondary | ICD-10-CM | POA: Diagnosis not present

## 2017-05-07 DIAGNOSIS — I350 Nonrheumatic aortic (valve) stenosis: Secondary | ICD-10-CM | POA: Insufficient documentation

## 2017-05-07 DIAGNOSIS — Z888 Allergy status to other drugs, medicaments and biological substances status: Secondary | ICD-10-CM | POA: Diagnosis not present

## 2017-05-07 DIAGNOSIS — L97519 Non-pressure chronic ulcer of other part of right foot with unspecified severity: Secondary | ICD-10-CM | POA: Insufficient documentation

## 2017-05-07 DIAGNOSIS — Z882 Allergy status to sulfonamides status: Secondary | ICD-10-CM | POA: Diagnosis not present

## 2017-05-07 DIAGNOSIS — I70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 of foot: Secondary | ICD-10-CM | POA: Diagnosis not present

## 2017-05-07 DIAGNOSIS — E1152 Type 2 diabetes mellitus with diabetic peripheral angiopathy with gangrene: Secondary | ICD-10-CM | POA: Diagnosis not present

## 2017-05-07 DIAGNOSIS — I509 Heart failure, unspecified: Secondary | ICD-10-CM | POA: Diagnosis not present

## 2017-05-07 DIAGNOSIS — I70232 Atherosclerosis 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 of calf: Secondary | ICD-10-CM | POA: Diagnosis not present

## 2017-05-07 DIAGNOSIS — E11621 Type 2 diabetes mellitus with foot ulcer: Secondary | ICD-10-CM | POA: Diagnosis not present

## 2017-05-07 DIAGNOSIS — E11622 Type 2 diabetes mellitus with other skin ulcer: Secondary | ICD-10-CM | POA: Diagnosis not present

## 2017-05-07 DIAGNOSIS — Z91013 Allergy to seafood: Secondary | ICD-10-CM | POA: Insufficient documentation

## 2017-05-07 DIAGNOSIS — L97219 Non-pressure chronic ulcer of right calf with unspecified severity: Secondary | ICD-10-CM | POA: Diagnosis not present

## 2017-05-07 DIAGNOSIS — Z91012 Allergy to eggs: Secondary | ICD-10-CM | POA: Insufficient documentation

## 2017-05-07 DIAGNOSIS — E785 Hyperlipidemia, unspecified: Secondary | ICD-10-CM | POA: Diagnosis not present

## 2017-05-07 DIAGNOSIS — D649 Anemia, unspecified: Secondary | ICD-10-CM | POA: Diagnosis not present

## 2017-05-07 DIAGNOSIS — E43 Unspecified severe protein-calorie malnutrition: Secondary | ICD-10-CM | POA: Diagnosis not present

## 2017-05-07 DIAGNOSIS — I110 Hypertensive heart disease with heart failure: Secondary | ICD-10-CM | POA: Insufficient documentation

## 2017-05-07 DIAGNOSIS — Z8673 Personal history of transient ischemic attack (TIA), and cerebral infarction without residual deficits: Secondary | ICD-10-CM | POA: Diagnosis not present

## 2017-05-07 DIAGNOSIS — Z794 Long term (current) use of insulin: Secondary | ICD-10-CM | POA: Diagnosis not present

## 2017-05-08 NOTE — Progress Notes (Signed)
[name], [name] ([id number]) Visit Report for 05/07/2017 Arrival Information Details Patient Name: [name] [name] ALRED. Date of Service: 05/07/2017 10:15 AM Medical Record Number: [id number] Patient Account Number: [id number] Date of Birth/Sex: 12-10-1940 (76 y.o. Female) Treating RN: Cornell Barman Primary Care Naevia Unterreiner: Beverlyn Roux Other Clinician: Referring Adeana Grilliot: Beverlyn Roux Treating Delan Ksiazek/Extender: Tito Dine in Treatment: 48 Visit Information History Since Last Visit Added or deleted any medications: No Patient Arrived: Wheel Chair Any new allergies or adverse reactions: No Arrival Time: 10:17 Had a fall or experienced change in No activities of daily living that may affect Accompanied By: husband risk of falls: Transfer Assistance: Manual Signs or symptoms of abuse/neglect No Patient Identification Verified: Yes since last visito Secondary Verification Process Yes Hospitalized since last visit: No Completed: Has Dressing in Place as Prescribed: Yes Patient Requires Transmission-Based No Has Footwear/Offloading in Place as Yes Precautions: Prescribed: Patient Has Alerts: No Left: Surgical Shoe with Pressure Relief Insole Right: Surgical Shoe with Pressure Relief Insole Pain Present Now: No Electronic Signature(s) Signed: 05/07/2017 4:42:06 PM By: Gretta Cool, BSN, RN, CWS, Kim RN, BSN Entered By: Gretta Cool, BSN, RN, CWS, Kim on 05/07/2017 10:19:34 [name] [name] ([id number]) -------------------------------------------------------------------------------- Encounter Discharge Information Details Patient Name: [name], [name] Date of Service: 05/07/2017 10:15 AM Medical Record Number: [id number] Patient Account Number: [id number] Date of Birth/Sex: [birth year] (76 y.o. Female) Treating RN: Cornell Barman Primary Care Amadea Keagy: Beverlyn Roux Other Clinician: Referring Shaniya Tashiro: Beverlyn Roux Treating Jayelle Page/Extender: Tito Dine in Treatment: 29 Encounter  Discharge Information Items Discharge Pain Level: 0 Discharge Condition: Stable Ambulatory Status: Wheelchair Discharge Destination: Home Transportation: Private Auto Accompanied By: husband Schedule Follow-up Appointment: Yes Medication Reconciliation completed and provided to Patient/Care Yes Bethanne Mule: Provided on Clinical Summary of Care: 05/07/2017 Form Type Recipient Paper Patient [facility] Electronic Signature(s) Signed: 05/07/2017 4:17:02 PM By: Ruthine Dose Entered By: Ruthine Dose on 05/07/2017 10:48:35 Navratil, Chicken. ([id number]) -------------------------------------------------------------------------------- Lower Extremity Assessment Details Patient Name: [name] [name] V. Date of Service: 05/07/2017 10:15 AM Medical Record Number: [id number] Patient Account Number: [id number] Date of Birth/Sex: 01-29-1941 (76 y.o. Female) Treating RN: Cornell Barman Primary Care Joel Mericle: Beverlyn Roux Other Clinician: Referring Anarosa Kubisiak: Beverlyn Roux Treating Italo Banton/Extender: Tito Dine in Treatment: 26 Vascular Assessment Pulses: Dorsalis Pedis Palpable: [Right:No] Doppler Audible: [Right:Yes] Posterior Tibial Extremity colors, hair growth, and conditions: Extremity Color: [Right:Hyperpigmented] Hair Growth on Extremity: [Right:No] Temperature of Extremity: [Right:Warm] Capillary Refill: [Right:> 3 seconds] Dependent Rubor: [Right:No] Blanched when Elevated: [Right:No] Lipodermatosclerosis: [Right:No] Toe Nail Assessment Left: Right: Thick: No Discolored: Yes Deformed: No Improper Length and Hygiene: No Notes Pitting edema +2; Doppler pulse on right very faint Electronic Signature(s) Signed: 05/07/2017 4:42:06 PM By: Gretta Cool, BSN, RN, CWS, Kim RN, BSN Entered By: Gretta Cool, BSN, RN, CWS, Kim on 05/07/2017 10:33:29 [name] [name] ([id number]) -------------------------------------------------------------------------------- Multi Wound Chart Details Patient Name: [name],  [name] V. Date of Service: 05/07/2017 10:15 AM Medical Record Number: [id number] Patient Account Number: [id number] Date of Birth/Sex: 08-04-1941 (76 y.o. Female) Treating RN: Cornell Barman Primary Care Early Steel: Beverlyn Roux Other Clinician: Referring Nashae Maudlin: Beverlyn Roux Treating Hagop Mccollam/Extender: Tito Dine in Treatment: 26 Vital Signs Height(in): 64 Pulse(bpm): 73 Weight(lbs): 200 Blood Pressure 114/66 (mmHg): Body Mass Index(BMI): 34 Temperature(F): 98 Respiratory Rate 16 (breaths/min): Photos: [N/A:N/A] Wound Location: Right Metatarsal head first N/A N/A - Dorsal Wounding Event: Gradually Appeared N/A N/A Primary Etiology: Diabetic Wound/Ulcer of N/A N/A the Lower Extremity Comorbid History: Cataracts, Chronic sinus N/A N/A problems/congestion, Congestive Heart Failure, Hypertension, Type  II Diabetes Date Acquired: 08/06/2013 N/A N/A Weeks of Treatment: 26 N/A N/A Wound Status: Open N/A N/A Measurements L x W x D 2x0.6x0.5 N/A N/A (cm) Area (cm) : 0.942 N/A N/A Volume (cm) : 0.471 N/A N/A % Reduction in Area: 20.00% N/A N/A % Reduction in Volume: -33.40% N/A N/A Classification: Grade 2 N/A N/A Exudate Amount: Large N/A N/A Exudate Type: Serous N/A N/A Exudate Color: amber N/A N/A Wound Margin: Flat and Intact N/A N/A [name] [name] V. ([id number]) Granulation Amount: Small (1-33%) N/A N/A Granulation Quality: Red N/A N/A Necrotic Amount: Medium (34-66%) N/A N/A Exposed Structures: Fat Layer (Subcutaneous N/A N/A Tissue) Exposed: Yes Fascia: No Tendon: No Muscle: No Joint: No Bone: No Epithelialization: Small (1-33%) N/A N/A Debridement: Debridement ([id number]- N/A N/A 11047) Pre-procedure 10:35 N/A N/A Verification/Time Out Taken: Pain Control: Other N/A N/A Tissue Debrided: Fibrin/Slough, N/A N/A Subcutaneous Level: Skin/Subcutaneous N/A N/A Tissue Debridement Area (sq 1.2 N/A N/A cm): Instrument: Curette N/A N/A Bleeding: Minimum  N/A N/A Hemostasis Achieved: Pressure N/A N/A Procedural Pain: 0 N/A N/A Post Procedural Pain: 0 N/A N/A Debridement Treatment Procedure was tolerated N/A N/A Response: well Post Debridement 2x0.6x0.5 N/A N/A Measurements L x W x D (cm) Post Debridement 0.471 N/A N/A Volume: (cm) Periwound Skin Texture: Induration: Yes N/A N/A Excoriation: No Callus: No Crepitus: No [name]: No Scarring: No Periwound Skin Maceration: Yes N/A N/A Moisture: Dry/Scaly: No Periwound Skin Color: Hemosiderin Staining: Yes N/A N/A Atrophie Blanche: No Cyanosis: No Ecchymosis: No Erythema: No Mottled: No [name] [name] V. ([id number]) Pallor: No Rubor: No Tenderness on No N/A N/A Palpation: Wound Preparation: Ulcer Cleansing: N/A N/A Rinsed/Irrigated with Saline Topical Anesthetic Applied: Other: lidocaine 4% Procedures Performed: Debridement N/A N/A Treatment Notes Electronic Signature(s) Signed: 05/07/2017 4:23:55 PM By: Linton Ham MD Entered By: Linton Ham on 05/07/2017 10:45:02 [name] [name] [name] ([id number]) -------------------------------------------------------------------------------- Multi-Disciplinary Care Plan Details Patient Name: [name] [name] V. Date of Service: 05/07/2017 10:15 AM Medical Record Number: [id number] Patient Account Number: [id number] Date of Birth/Sex: [birth year] (76 y.o. Female) Treating RN: Cornell Barman Primary Care Reyden Smith: Beverlyn Roux Other Clinician: Referring Cathline Dowen: Beverlyn Roux Treating Dominigue Gellner/Extender: Tito Dine in Treatment: 26 Active Inactive ` Abuse / Safety / Falls / Self Care Management Nursing Diagnoses: Impaired physical mobility Potential for falls Goals: Patient will remain injury free Date Initiated: 11/06/2016 Target Resolution Date: 12/30/2016 Goal Status: Active Patient/caregiver will verbalize understanding of skin care regimen Date Initiated: 11/06/2016 Target Resolution Date: 12/30/2016 Goal Status:  Active Interventions: Assess fall risk on admission and as needed Treatment Activities: Patient referred to home care : 11/06/2016 Notes: ` Nutrition Nursing Diagnoses: Potential for alteratiion in Nutrition/Potential for imbalanced nutrition Goals: Patient/caregiver verbalizes understanding of need to maintain therapeutic glucose control per primary care physician Date Initiated: 11/06/2016 Target Resolution Date: 12/30/2016 Goal Status: Active Interventions: Provide education on elevated blood sugars and impact on wound healing Neaves, Delpha V. ([id number]) Notes: ` Orientation to the Wound Care Program Nursing Diagnoses: Knowledge deficit related to the wound healing center program Goals: Patient/caregiver will verbalize understanding of the [place] Date Initiated: 11/06/2016 Target Resolution Date: 12/30/2016 Goal Status: Active Interventions: Provide education on orientation to the wound center Notes: ` Venous Leg Ulcer Nursing Diagnoses: Actual venous Insuffiency (use after diagnosis is confirmed) Knowledge deficit related to disease process and management Goals: Non-invasive venous studies are completed as ordered Date Initiated: 11/06/2016 Target Resolution Date: 12/30/2016 Goal Status: Active Patient/caregiver will verbalize understanding of disease process and disease management  Date Initiated: 11/06/2016 Target Resolution Date: 12/30/2016 Goal Status: Active Interventions: Assess peripheral edema status every visit. Notes: ` Wound/Skin Impairment Nursing Diagnoses: Impaired tissue integrity Knowledge deficit related to smoking impact on wound healing Knowledge deficit related to ulceration/compromised skin integrity Goals: Fidalgo, Yesika V. ([id number]) Ulcer/skin breakdown will heal within 14 weeks Date Initiated: 11/06/2016 Target Resolution Date: 02/05/2017 Goal Status: Active Interventions: Assess ulceration(s) every visit Treatment  Activities: Skin care regimen initiated : 11/06/2016 Notes: Electronic Signature(s) Signed: 05/07/2017 4:42:06 PM By: Gretta Cool, BSN, RN, CWS, Kim RN, BSN Entered By: Gretta Cool, BSN, RN, CWS, Kim on 05/07/2017 10:27:57 [name] [name] [name] ([id number]) -------------------------------------------------------------------------------- Pain Assessment Details Patient Name: Kilty, Swan[place] Date of Service: 05/07/2017 10:15 AM Medical Record Number: [id number] Patient Account Number: [id number] Date of Birth/Sex: 05/02/1941 (76 y.o. Female) Treating RN: Cornell Barman Primary Care Helmuth Recupero: Beverlyn Roux Other Clinician: Referring Demone Lyles: Beverlyn Roux Treating Charika Mikelson/Extender: Tito Dine in Treatment: 26 Active Problems Location of Pain Severity and Description of Pain Patient Has Paino No Site Locations With Dressing Change: No Pain Management and Medication Current Pain Management: Goals for Pain Management Topical or injectable lidocaine is offered to patient for acute pain when surgical debridement is performed. If needed, Patient is instructed to use over the counter pain medication for the following 24-48 hours after debridement. Wound care MDs do not prescribed pain medications. Patient has chronic pain or uncontrolled pain. Patient has been instructed to make an appointment with their Primary Care Physician for pain management. Electronic Signature(s) Signed: 05/07/2017 4:42:06 PM By: Gretta Cool, BSN, RN, CWS, Kim RN, BSN Entered By: Gretta Cool, BSN, RN, CWS, Kim on 05/07/2017 10:18:25 Gutmann, [name] [name] ([id number]) -------------------------------------------------------------------------------- Patient/Caregiver Education Details Patient Name: [name], Antoinetta V. Date of Service: 05/07/2017 10:15 AM Medical Record Patient Account Number: [id number] Number: Treating RN: Cornell Barman [birth year] (76 y.o. Other Clinician: Date of Birth/Gender: Female) Treating ROBSON, [name] Primary Care  Physician: Beverlyn Roux Physician/Extender: G Referring Physician: Dorthula Nettles in Treatment: 4 Education Assessment Education Provided To: Patient Education Topics Provided Wound/Skin Impairment: Handouts: Caring for Your Ulcer, Other: wound care as prescribed Methods: Demonstration Responses: State content correctly Electronic Signature(s) Signed: 05/07/2017 4:42:06 PM By: Gretta Cool, BSN, RN, CWS, Kim RN, BSN Entered By: Gretta Cool, BSN, RN, CWS, Kim on 05/07/2017 10:48:03 , Ayianna [name] ([id number]) -------------------------------------------------------------------------------- Wound Assessment Details Patient Name: [name] [name] V. Date of Service: 05/07/2017 10:15 AM Medical Record Number: [id number] Patient Account Number: [id number] Date of Birth/Sex: 02-12-1941 (76 y.o. Female) Treating RN: Cornell Barman Primary Care Maryclaire Stoecker: Beverlyn Roux Other Clinician: Referring Majel Giel: Beverlyn Roux Treating Latunya Kissick/Extender: Tito Dine in Treatment: 26 Wound Status Wound Number: 7 Primary Diabetic Wound/Ulcer of the Lower Etiology: Extremity Wound Location: Right Metatarsal head first - Dorsal Wound Open Status: Wounding Event: Gradually Appeared Comorbid Cataracts, Chronic sinus Date Acquired: 08/06/2013 History: problems/congestion, Congestive Heart Weeks Of Treatment: 26 Failure, Hypertension, Type II Diabetes Clustered Wound: No Photos Wound Measurements Length: (cm) 2 Width: (cm) 0.6 Depth: (cm) 0.5 Area: (cm) 0.942 Volume: (cm) 0.471 % Reduction in Area: 20% % Reduction in Volume: -33.4% Epithelialization: Small (1-33%) Tunneling: No Undermining: No Wound Description Classification: Grade 2 Wound Margin: Flat and Intact Exudate Amount: Large Exudate Type: Serous Exudate Color: amber Foul Odor After Cleansing: No Slough/Fibrino Yes Wound Bed Granulation Amount: Small (1-33%) Exposed Structure Granulation Quality: Red Fascia Exposed:  No Necrotic Amount: Medium (34-66%) Fat Layer (Subcutaneous Tissue) Exposed: Yes Necrotic Quality: Adherent Slough Tendon Exposed: No Muscle Exposed: No Joint Exposed: No  Bone Exposed: No Retter, [name] [name] V. ([id number]) Periwound Skin Texture Texture Color No Abnormalities Noted: No No Abnormalities Noted: No Callus: No Atrophie Blanche: No Crepitus: No Cyanosis: No Excoriation: No Ecchymosis: No Induration: Yes Erythema: No [name]: No Hemosiderin Staining: Yes Scarring: No Mottled: No Pallor: No Moisture Rubor: No No Abnormalities Noted: No Dry / Scaly: No Maceration: Yes Wound Preparation Ulcer Cleansing: Rinsed/Irrigated with Saline Topical Anesthetic Applied: Other: lidocaine 4%, Treatment Notes Wound #7 (Right, Dorsal Metatarsal head first) 1. Cleansed with: Clean wound with Normal Saline 2. Anesthetic Topical Lidocaine 4% cream to wound bed prior to debridement 4. Dressing Applied: Other dressing (specify in notes) 5. Secondary Dressing Applied ABD Pad Foam Notes endoform, foam Electronic Signature(s) Signed: 05/07/2017 4:42:06 PM By: Gretta Cool, BSN, RN, CWS, Kim RN, BSN Entered By: Gretta Cool, BSN, RN, CWS, Kim on 05/07/2017 10:26:25 Gonia, [name] [name] ([id number]) -------------------------------------------------------------------------------- Vitals Details Patient Name: [name] [name] V. Date of Service: 05/07/2017 10:15 AM Medical Record Number: [id number] Patient Account Number: [id number] Date of Birth/Sex: 08-Jun-1941 (76 y.o. Female) Treating RN: Cornell Barman Primary Care Blakeley Margraf: Beverlyn Roux Other Clinician: Referring Hussien Greenblatt: Beverlyn Roux Treating Danetta Prom/Extender: Tito Dine in Treatment: 26 Vital Signs Time Taken: 10:18 Temperature (F): 98 Height (in): 64 Pulse (bpm): 73 Weight (lbs): 200 Respiratory Rate (breaths/min): 16 Body Mass Index (BMI): 34.3 Blood Pressure (mmHg): 114/66 Reference Range: 80 - 120 mg / dl Inhaled Oxygen  Concentration (%): 93 Electronic Signature(s) Signed: 05/07/2017 4:42:06 PM By: Gretta Cool, BSN, RN, CWS, Kim RN, BSN Entered By: Gretta Cool, BSN, RN, CWS, Kim on 05/07/2017 10:18:58

## 2017-05-08 NOTE — Progress Notes (Signed)
[name] ([id number]) Visit Report for 05/07/2017 Debridement Details Patient Name: [name], [name] Date of Service: 05/07/2017 10:15 AM Medical Record Patient Account Number: [id number] Number: Treating RN: Cornell Barman [birth year] (76 y.o. Other Clinician: Date of Birth/Sex: Female) Treating Lachell Rochette Primary Care Provider: Beverlyn Roux Provider/Extender: G Referring Provider: Dorthula Nettles in Treatment: 26 Debridement Performed for Wound #7 Right,Dorsal Metatarsal head first Assessment: Performed By: Physician Ricard Dillon, MD Debridement: Debridement Severity of Tissue Pre Fat layer exposed Debridement: Pre-procedure Verification/Time Out Yes - 10:35 Taken: Start Time: 10:36 Pain Control: Other : lidocaine 4% Level: Skin/Subcutaneous Tissue Total Area Debrided (L x 2 (cm) x 0.6 (cm) = 1.2 (cm) W): Tissue and other Viable, Non-Viable, Fibrin/Slough, Subcutaneous material debrided: Instrument: Curette Bleeding: Minimum Hemostasis Achieved: Pressure End Time: 10:36 Procedural Pain: 0 Post Procedural Pain: 0 Response to Treatment: Procedure was tolerated well Post Debridement Measurements of Total Wound Length: (cm) 2 Width: (cm) 0.6 Depth: (cm) 0.5 Volume: (cm) 0.471 Character of Wound/Ulcer Post Stable Debridement: Severity of Tissue Post Debridement: Fat layer exposed Post Procedure Diagnosis Same as Pre-procedure [name] ([id number]) Electronic Signature(s) Signed: 05/07/2017 4:23:55 PM By: Linton Ham MD Signed: 05/07/2017 4:42:06 PM By: Gretta Cool, BSN, RN, CWS, Kim RN, BSN Entered By: Linton Ham on 05/07/2017 10:45:21 [name], [name] ([id number]) -------------------------------------------------------------------------------- HPI Details Patient Name: [name], [name] Date of Service: 05/07/2017 10:15 AM Medical Record Patient Account Number: [id number] Number: Treating RN: Cornell Barman 11-22-1940 (76  y.o. Other Clinician: Date of Birth/Sex: Female) Treating Daril Warga Primary Care Provider: Beverlyn Roux Provider/Extender: G Referring Provider: Dorthula Nettles in Treatment: 26 History of Present Illness HPI Description: 08/13/16: This is a 76 year old woman who came predominantly for review of 3 cm in diameter circular wound to the left anterior lateral leg. She was in the ER on 08/01/16 I reviewed their notes. There was apparently pus coming out of the wound at that time and the patient arrived requesting debridement which they don't do in the emergency room. Nevertheless I can't see that they did any x-rays. There were no cultures done. She is a type II diabetic and I a note after the patient was in the clinic that she had a bypass graft from the popliteal to the tibial on the right on 02/28/16. She also had a right greater saphenous vein harvest on the same date for arterial bypass. She is going to have vascular studies including ABIs T ABIs on the right on 08/28/16. The patient's surgery was on 02/28/16 by Dr. Vallarie Mare she had a right below the knee popliteal artery to peroneal artery bypass with reverse greater saphenous vein and an endarterectomy of the mid segment peroneal artery. Postoperatively she had a strong mild monophasic peroneal signal with a pink foot. It would appear that the patient is had some nonhealing in the surgical saphenous vein harvest site on the left leg. Surprisingly looking [facility] I cannot see much information about this at all. Dr. Lucious Groves notes from 05/29/16 show that the patient's wounds "are not healed" the right first metatarsal wound healed but then opened back up. 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 At that point the patient had a wound VAC to her right calf with regards to the wounds on her dorsal right toe would appear that  these are felt to be arterial wounds. She has had surgery on the metatarsal  phalangeal in 2015 I Dr. Doran Durand secondary to a right metatarsal phalangeal joint fracture. She is apparently had discoloration around this area since then. 08/28/16; patient arrives with her wounds in much the same condition. The linear vein harvest site and the circular wound below it which I think was a blister. She also has to probing holes in her right great toe and a necrotic eschar on the right second toe. Because of these being arterial wounds I reduced her compression from 3-2 layers this seems to of done satisfactorily she has not had any problems. I cannot see that she is actually had an x-ray ====== 11/06/16 the patient comes in for evaluation of her right lower extremity ulcers. She was here in January 2018 for 2 visits subsequently ended up in the hospital with pneumonia and then to rehabilitation. She has now been discharged from rehabilitation and is home. She has multiple ulcerations to her right lower extremity including the foot and toes. She does have home health in place and they have been placing alginate to the ulcers. She is followed by Dr. Bridgett Larsson of vascular medicine. 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 She recently saw him on 3/23. In office ABIs were: [name] ([id number]) Right 0.48 with monophasic flow to the DP, PT, peroneal Left 0.63 with monophasic flow to the DP and PT Her arterial studies indicated a patent right below knee popliteal to peroneal bypass She had an MRI in February 2008 that was negative frosty myelitis but this showed general soft tissue edema in the right foot and lower extremity concerning for cellulitis She is a diabetic, managed with insulin. Her hemoglobin A1c in December 2017 was 8.4 which is a trend up from previous levels. She had blood work in February 2018 which revealed an albumin of  2.6 this appears to be relatively acute as an albumin in November 2017 was 3.7 11/13/16; this is a patient I have not seen since February who is readmitted to our clinic last week. 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 Bridgett Larsson. I have reviewed Dr. Lianne Moris notes from March/23/18. Doppler ABI on that date showed an ABI on the right of 0.48 and on the left of 0.63. Dorsalis pedis waveforms were monophasic bilaterally. There was no waveforms detected at the posterior tibial on the right, monophasic on the left. Dr. Lianne Moris comments were that this patient would have follow-up vascular studies in 3 months including ABIs and right lower extremity arterial duplex. She had an MRI in February that was negative for osteomyelitis but showed generalized edema in the foot. Last albumin I see was in January at 3.4 we have been using Santyl to the 4 wounds on the right leg the patient is noted today to have widespread edema well up towards her groin this is pitting 2-3+. 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 Normal ejection fraction. 11/20/16; patient has a follow-up appointment with Dr. Bridgett Larsson on April 23. She is still complaining of a lot of pain in the right foot and right leg. It is not clear to me that this is at all positional however I think it is clear claudication with minimal activity perhaps at rest. At our suggestion she is return to her primary physician's office tomorrow with regards to her pitting lower extremity  bilateral edema that I reviewed in detail last week 11/27/16; the follow-up with Dr. Bridgett Larsson was actually on May 23 on April 23 as I stated in my note last week. N/A case all of her wounds seems somewhat smaller. The 2 on the right leg are definitely smaller. The areas on the dorsal right first toe, right third toe and the lateral part of the right fifth metatarsal  head all looks smaller but have tightly adherent surfaces. We have been using Santyl 12/04/16; follow-up with Dr. Bridgett Larsson on May 23. 2 small open wounds on the right leg continue to get smaller. The area on the right third total lateral aspect of the right fifth toe also look better. The remaining area on the dorsal first toe still has some depth to it. We have been using Santyl to the toes and collagen on the right 12/11/16; according to patient's husband the follow-up with Dr. Bridgett Larsson is not until July. All of the wounds on the right leg are measuring smaller. We have been using some combination of Prisma and Santyl although I think we can go to straight Prisma today. There may have been some confusion with home health about the primary dressing orders here. 12/25/16; the patient has had some healing this week. The area on her right lateral fifth metatarsal head, right third toe are both healed lower right leg is healed. In the vein harvest site superiorly she has one superficial open area. On the dorsal aspect of her right great toe/MTP joint the wound is now divided into 2 however the proximal area is deep and there is palpable bone 01/01/17; still an open area in the middle of her original right surgical scar. The area on the right third toe and right lateral fifth metatarsal head remained closed. Problematic area on the dorsal aspect of the toe. Previous surgery in this area line 01/08/17 small open area on the original scar on her upper anterior leg although this is closing. X-ray I did of the right first toe did not show underlying bony abnormality. Still this area on the dorsal first toe probes to bone. We have been using Prisma 01/15/17; small open area in the original scar in the upper anterior leg is almost fully closed. She has 2 open areas over the dorsal aspect of the right first toe that probes to bone. Used and a form starting last week. Vigorous bone scraping that I did last week showed few  methicillin sensitive staph aureus. She is allergic to Kunesh, Lucine V. ([id number]) penicillin and sulfa drugs. I'm going to give her 2 weeks of doxycycline. She will need an MRI with contrast. 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 She continues on doxycycline for a bone scraping that showed methicillin sensitive staph aureus [allergic to penicillin and sulfa]. We have been using Endo form to the wound. 01/29/17; surprisingly her MRI did not show osteomyelitis. She continues on doxycycline for a bone scraping that showed methicillin sensitive staph aureus with allergies to penicillin and sulfa. We have been using Endo form to the wound. Unfortunately I cannot get a surface on this visit looks like it is able to support a healing state. Proximally there is still exposed bone. There is no overt soft tissue tenderness. Her MRI did show a previous fracture was surgery to this area but no hardware 02/12/17 on evaluation today patient appears to be doing well in  regard to her lower extremity wound. She does have some mild discomfort but this is minimal. There's no evidence of infection. Her left great toe nail has somewhat been lifted up and there is a little bit of slight bleeding underneath but this is still firmly attached. 02/19/17; she ran out of doxycycline 2 days ago. Nevertheless I would like to continue this for 2 weeks to make a full 6 weeks of therapy as the bone scraping that I did from the open area on the dorsal right first toe showed MRSA. This should complete antibiotic therapy. 02/26/17; the patient is completing her 6 weeks of oral doxycycline. Deep wound on the dorsal right toe. This still has some exposed bone proximally. She does have a reasonably granulated surface albeit thin over bone. I've been using Endo form have made application for an amniotic skin sub 03/05/17; the patient has completed 6 weeks of  doxycycline. This is a deep wound on the dorsal right toe which has exposed bone proximally. She has granulation over most of this wound albeit a thin layer. I've been using Endo form. Still do not have approval for [employer] 03/13/17 on evaluation today patient's right foot wound appears to be doing better measurement wise compared to her last evaluation. She has been tolerating the dressing change without complication and does have a appointment with the dietary that has been made as far as referral is concerned there just waiting for her and transportation to contact them back for an actual date. Nonetheless patient has been having less pain at this point. No fevers, chills, nausea, or vomiting noted at this time. 03/26/17; linear wound over the dorsal aspect of her right great toe. At one point this had exposed bone on the most superior aspect however I am pleased to see today that this appears to have a surface of granulation. Apparently product was applied for but denied by [facility]. We'll need to see what that was. The patient has known PAD status post revascularization. MRI did not show osteomyelitis which was done in June 04/02/17; continued improvement using Endoform. She was approved for [employer] but hasn't over $016 co-pay per application, this is beyond her means 04/09/17; continues on Endoform. 04/16/17; appears to be doing nicely continuing on Endoform 04/22/17; patient did not have her dressing changed all week because of the weather. Using Endoform. Base of the wound looks healthy elbow there appears to events surrounding maceration perhaps because of the drainage was not changing the wound dressing 04/30/17 wound today continues to close and except for a small divot on the superior part of the wound. This area did not probe the bone but I found this a little concerning as this was the area with exposed bone before we be able to get this to granulate forward. As I remember things she is not a  candidate for skin substitutes secondary to an outlandish co-pay. I asked her husband to look into the out of pocket max for medications if he has 1 [Regranex] or totally for skin substitutes 05/07/17; the patient arrives today with a wound roughly the same size although under careful inspection under the light there appears to be some epithelialization. Her intake nurse noted that the Endoform seemed to be placed over the wound rather than in the deeper divots they could really benefit from the Endoform. At this point I have no plans to change the Endoform as at one point this was at least 33% exposed bone and the rest of the wound  very close to the underlying phalanx [name], [name] ([id number]) Electronic Signature(s) Signed: 05/07/2017 4:23:55 PM By: Linton Ham MD Entered By: Linton Ham on 05/07/2017 10:52:04 [name], [name] ([id number]) -------------------------------------------------------------------------------- Physical Exam Details Patient Name: Dehner, Kawena V. Date of Service: 05/07/2017 10:15 AM Medical Record Patient Account Number: [id number] Number: Treating RN: Cornell Barman Dec 12, 1940 (76 y.o. Other Clinician: Date of Birth/Sex: Female) Treating Avanelle Pixley Primary Care Provider: Beverlyn Roux Provider/Extender: G Referring Provider: Dorthula Nettles in Treatment: 26 Constitutional Sitting or standing Blood Pressure is within target range for patient.. Pulse regular and within target range for patient.[name] Respirations regular, non-labored and within target range.. Temperature is normal and within the target range for the patient.[name] appears in no distress. Cardiovascular Very difficult to feel with any dorsalis pedis pulse. Known PAD however her forefoot is warm. Notes Wound exam; linear wound on the dorsal aspect of the left great toe. It appears that some parts of this are epithelializing but she still has a distal and proximal divot. The proximal  divot was where the exposed bone was initially although there is none of that now. There is no evidence of surrounding infection. Using a #3 curet I remove necrotic surface debris from the 2 deeper divots of this wound. Hemostasis with direct pressure. Post-debridement the areas that aren't epithelialized look healthier. I am cautiously encouraged Electronic Signature(s) Signed: 05/07/2017 4:23:55 PM By: Linton Ham MD Entered By: Linton Ham on 05/07/2017 10:59:58 Zoll, Celisse [name] ([id number]) -------------------------------------------------------------------------------- Physician Orders Details Patient Name: Winningham, [name] Date of Service: 05/07/2017 10:15 AM Medical Record Patient Account Number: [id number] Number: Treating RN: Cornell Barman 1940-09-20 (76 y.o. Other Clinician: Date of Birth/Sex: Female) Treating Shantea Poulton Primary Care Provider: Beverlyn Roux Provider/Extender: G Referring Provider: Dorthula Nettles in Treatment: 1 Verbal / Phone Orders: No Diagnosis Coding Wound Cleansing Wound #7 Right,Dorsal Metatarsal head first o Clean wound with Normal Saline. o Cleanse wound with mild soap and water Anesthetic Wound #7 Right,Dorsal Metatarsal head first o Topical Lidocaine 4% cream applied to wound bed prior to debridement Primary Wound Dressing Wound #7 Right,Dorsal Metatarsal head first o Other: - ENDOFORM-moisten before applying and place onto/into wound bed. Secondary Dressing Wound #7 Right,Dorsal Metatarsal head first o Dry Gauze o Conform/Kerlix o Drawtex Dressing Change Frequency Wound #7 Right,Dorsal Metatarsal head first o Change Dressing Monday, Wednesday, Friday Follow-up Appointments Wound #7 Right,Dorsal Metatarsal head first o Return Appointment in 1 week. Edema Control Wound #7 Right,Dorsal Metatarsal head first o Elevate legs to the level of the heart and pump ankles as often as possible Additional  Orders / Instructions Wound #7 Right,Dorsal Metatarsal head first Orlich, Harvest V. ([id number]) o Increase protein intake. o Activity as tolerated Home Health Wound #7 Right,Dorsal Metatarsal head first o Continue Home Health Visits - [facility] weekly, [facility] o Home Health Nurse may visit PRN to address patientos wound care needs. o FACE TO FACE ENCOUNTER: MEDICARE and MEDICAID PATIENTS: I certify that this patient is under my care and that I had a face-to-face encounter that meets the physician face-to-face encounter requirements with this patient on this date. The encounter with the patient was in whole or in part for the following MEDICAL CONDITION: (primary reason for [place]) MEDICAL NECESSITY: I certify, that based on my findings, NURSING services are a medically necessary home health service. HOME BOUND STATUS: I certify that my clinical findings support that this patient is homebound (i.e., Due to illness or injury, pt  requires aid of supportive devices such as crutches, cane, wheelchairs, walkers, the use of special transportation or the assistance of another person to leave their place of residence. There is a normal inability to leave the home and doing so requires considerable and taxing effort. Other absences are for medical reasons / religious services and are infrequent or of short duration when for other reasons). o If current dressing causes regression in wound condition, may D/C ordered dressing product/s and apply Normal Saline Moist Dressing daily until next [place] / Other MD appointment. [place] of regression in wound condition at [phone]. o Please direct any NON-WOUND related issues/requests for orders to patient's Primary Care Physician Electronic Signature(s) Signed: 05/07/2017 4:23:55 PM By: Linton Ham MD Signed: 05/07/2017 4:42:06 PM By: Gretta Cool, BSN, RN, CWS, Kim RN,  BSN Entered By: Gretta Cool, BSN, RN, CWS, Kim on 05/07/2017 10:39:18 [name], [name] ([id number]) -------------------------------------------------------------------------------- Problem List Details Patient Name: Pietila, Keanna V. Date of Service: 05/07/2017 10:15 AM Medical Record Patient Account Number: [id number] Number: Treating RN: Cornell Barman [birth year] (76 y.o. 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 ([id number]) -------------------------------------------------------------------------------- Progress Note Details Patient Name: [name], [name] Date of Service: 05/07/2017 10:15 AM Medical Record Patient Account Number: [id number] Number: Treating RN: Cornell Barman November 30, 1940 (76 y.o. Other Clinician: Date of Birth/Sex: Female) Treating Lundy Cozart Primary Care Provider: Beverlyn Roux Provider/Extender: G Referring Provider: Dorthula Nettles in Treatment: 26 Subjective History of Present Illness (HPI) 08/13/16: This is a 76 year old woman who came predominantly for review of 3 cm in diameter circular wound to the left anterior lateral leg. She was in the ER on 08/01/16 I reviewed their notes. There was apparently pus coming out of the wound at that time and the patient arrived requesting debridement which they don't do in the emergency room. Nevertheless I can't see that they did any x-rays. There were no cultures done. She is a type II diabetic and I a note after the patient was in the clinic that she had a bypass graft from the popliteal to the tibial on the right on 02/28/16. She also had a right greater saphenous vein harvest on the same date for arterial bypass. She is going to have vascular studies including ABIs T ABIs on the right on 08/28/16. The patient's surgery was on 02/28/16 by Dr. Vallarie Mare she had a right below the knee popliteal artery to peroneal artery bypass with reverse greater saphenous vein and an endarterectomy of the mid segment peroneal artery. Postoperatively she had a strong mild monophasic peroneal signal with a pink foot. It would appear that the patient is had  some nonhealing in the surgical saphenous vein harvest site on the left leg. Surprisingly looking [facility] I cannot see much information about this at all. Dr. Lucious Groves notes from 05/29/16 show that the patient's wounds "are not healed" the right first metatarsal wound healed but then opened back up. 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 At that point the patient had a wound VAC to her right calf with regards to the wounds on her dorsal right toe would appear that these are felt to be arterial wounds. She has had surgery on the metatarsal phalangeal in 2015 I Dr. Doran Durand secondary to a right metatarsal phalangeal joint  fracture. She is apparently had discoloration around this area since then. 08/28/16; patient arrives with her wounds in much the same condition. The linear vein harvest site and the circular wound below it which I think was a blister. She also has to probing holes in her right great toe and a necrotic eschar on the right second toe. Because of these being arterial wounds I reduced her compression from 3-2 layers this seems to of done satisfactorily she has not had any problems. I cannot see that she is actually had an x-ray ====== 11/06/16 the patient comes in for evaluation of her right lower extremity ulcers. She was here in January 2018 for 2 visits subsequently ended up in the hospital with pneumonia and then to rehabilitation. She has now been discharged from rehabilitation and is home. She has multiple ulcerations to her right lower extremity including the foot and toes. She does have home health in place and they have been placing alginate to the ulcers. She is followed by Dr. Bridgett Larsson of vascular medicine. 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 She recently saw him on 3/23. In office ABIs were: Delbene, LESLYN MONDA. ([id number]) Right 0.48 with monophasic flow to the DP, PT, peroneal Left 0.63 with monophasic flow to the DP and PT Her arterial studies indicated a patent right below knee popliteal to peroneal bypass She had an MRI in February 2008 that was negative frosty myelitis but this showed general soft tissue edema in the right foot and lower extremity concerning for cellulitis She is a diabetic, managed with insulin. Her hemoglobin A1c in December 2017 was 8.4 which is a trend up from previous levels. She had blood work in February 2018 which revealed an albumin of 2.6 this appears to be relatively acute as an albumin in November 2017 was 3.7 11/13/16; this is a patient I have not seen since February who is readmitted to  our clinic last week. 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 Bridgett Larsson. I have reviewed Dr. Lianne Moris notes from March/23/18. Doppler ABI on that date showed an ABI on the right of 0.48 and on the left of 0.63. Dorsalis pedis waveforms were monophasic bilaterally. There was no waveforms detected at the posterior tibial on the right, monophasic on the left. Dr. Lianne Moris comments were that this patient would have follow-up vascular studies in 3 months including ABIs and right lower extremity arterial duplex. She had an MRI in February that was negative for osteomyelitis but showed generalized edema in the foot. Last albumin I see was in January at 3.4 we have been using Santyl to the  4 wounds on the right leg the patient is noted today to have widespread edema well up towards her groin this is pitting 2-3+. 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 Normal ejection fraction. 11/20/16; patient has a follow-up appointment with Dr. Bridgett Larsson on April 23. She is still complaining of a lot of pain in the right foot and right leg. It is not clear to me that this is at all positional however I think it is clear claudication with minimal activity perhaps at rest. At our suggestion she is return to her primary physician's office tomorrow with regards to her pitting lower extremity bilateral edema that I reviewed in detail last week 11/27/16; the follow-up with Dr. Bridgett Larsson was actually on May 23 on April 23 as I stated in my note last week. N/A case all of her wounds seems somewhat smaller. The 2 on the right leg are definitely smaller. The areas on the dorsal right first toe, right third toe and the lateral part of the right fifth metatarsal head all looks smaller but have tightly adherent surfaces. We have been using Santyl 12/04/16; follow-up with Dr. Bridgett Larsson on May 23. 2 small open wounds on the  right leg continue to get smaller. The area on the right third total lateral aspect of the right fifth toe also look better. The remaining area on the dorsal first toe still has some depth to it. We have been using Santyl to the toes and collagen on the right 12/11/16; according to patient's husband the follow-up with Dr. Bridgett Larsson is not until July. All of the wounds on the right leg are measuring smaller. We have been using some combination of Prisma and Santyl although I think we can go to straight Prisma today. There may have been some confusion with home health about the primary dressing orders here. 12/25/16; the patient has had some healing this week. The area on her right lateral fifth metatarsal head, right third toe are both healed lower right leg is healed. In the vein harvest site superiorly she has one superficial open area. On the dorsal aspect of her right great toe/MTP joint the wound is now divided into 2 however the proximal area is deep and there is palpable bone 01/01/17; still an open area in the middle of her original right surgical scar. The area on the right third toe and right lateral fifth metatarsal head remained closed. Problematic area on the dorsal aspect of the toe. Previous surgery in this area line 01/08/17 small open area on the original scar on her upper anterior leg although this is closing. X-ray I did of the right first toe did not show underlying bony abnormality. Still this area on the dorsal first toe probes to bone. We have been using Prisma 01/15/17; small open area in the original scar in the upper anterior leg is almost fully closed. She has 2 open areas over the dorsal aspect of the right first toe that probes to bone. Used and a form starting last week. Vigorous bone scraping that I did last week showed few methicillin sensitive staph aureus. She is allergic to Laine, Lariza V. ([id number]) penicillin and sulfa drugs. I'm going to give her 2 weeks of  doxycycline. She will need an MRI with contrast. 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 She continues on doxycycline for a bone scraping that  showed methicillin sensitive staph aureus [allergic to penicillin and sulfa]. We have been using Endo form to the wound. 01/29/17; surprisingly her MRI did not show osteomyelitis. She continues on doxycycline for a bone scraping that showed methicillin sensitive staph aureus with allergies to penicillin and sulfa. We have been using Endo form to the wound. Unfortunately I cannot get a surface on this visit looks like it is able to support a healing state. Proximally there is still exposed bone. There is no overt soft tissue tenderness. Her MRI did show a previous fracture was surgery to this area but no hardware 02/12/17 on evaluation today patient appears to be doing well in regard to her lower extremity wound. She does have some mild discomfort but this is minimal. There's no evidence of infection. Her left great toe nail has somewhat been lifted up and there is a little bit of slight bleeding underneath but this is still firmly attached. 02/19/17; she ran out of doxycycline 2 days ago. Nevertheless I would like to continue this for 2 weeks to make a full 6 weeks of therapy as the bone scraping that I did from the open area on the dorsal right first toe showed MRSA. This should complete antibiotic therapy. 02/26/17; the patient is completing her 6 weeks of oral doxycycline. Deep wound on the dorsal right toe. This still has some exposed bone proximally. She does have a reasonably granulated surface albeit thin over bone. I've been using Endo form have made application for an amniotic skin sub 03/05/17; the patient has completed 6 weeks of doxycycline. This is a deep wound on the dorsal right toe which has exposed bone proximally. She has granulation over most of this wound albeit  a thin layer. I've been using Endo form. Still do not have approval for Affinity 03/13/17 on evaluation today patient's right foot wound appears to be doing better measurement wise compared to her last evaluation. She has been tolerating the dressing change without complication and does have a appointment with the dietary that has been made as far as referral is concerned there just waiting for her and transportation to contact them back for an actual date. Nonetheless patient has been having less pain at this point. No fevers, chills, nausea, or vomiting noted at this time. 03/26/17; linear wound over the dorsal aspect of her right great toe. At one point this had exposed bone on the most superior aspect however I am pleased to see today that this appears to have a surface of granulation. Apparently product was applied for but denied by [facility]. We'll need to see what that was. The patient has known PAD status post revascularization. MRI did not show osteomyelitis which was done in June 04/02/17; continued improvement using Endoform. She was approved for Oasis but hasn't over $875 co-pay per application, this is beyond her means 04/09/17; continues on Endoform. 04/16/17; appears to be doing nicely continuing on Endoform 04/22/17; patient did not have her dressing changed all week because of the weather. Using Endoform. Base of the wound looks healthy elbow there appears to events surrounding maceration perhaps because of the drainage was not changing the wound dressing 04/30/17 wound today continues to close and except for a small divot on the superior part of the wound. This area did not probe the bone but I found this a little concerning as this was the area with exposed bone before we be able to get this to granulate forward. As I remember things she is not a  candidate for skin substitutes secondary to an outlandish co-pay. I asked her husband to look into the out of pocket max for medications if he  has 1 [Regranex] or totally for skin substitutes 05/07/17; the patient arrives today with a wound roughly the same size although under careful inspection under the light there appears to be some epithelialization. Her intake nurse noted that the Endoform seemed to be placed over the wound rather than in the deeper divots they could really benefit from the Endoform. At this point I have no plans to change the Endoform as at one point this was at least 33% exposed bone and the rest of the wound very close to the underlying phalanx [name], [name] ([id number]) Objective Constitutional Sitting or standing Blood Pressure is within target range for patient.. Pulse regular and within target range for patient.[name] Respirations regular, non-labored and within target range.. Temperature is normal and within the target range for the patient.[name] appears in no distress. Vitals Time Taken: 10:18 AM, Height: 64 in, Weight: 200 lbs, BMI: 34.3, Temperature: 98 F, Pulse: 73 bpm, Respiratory Rate: 16 breaths/min, Blood Pressure: 114/66 mmHg. Cardiovascular Very difficult to feel with any dorsalis pedis pulse. Known PAD however her forefoot is warm. General Notes: Wound exam; linear wound on the dorsal aspect of the left great toe. It appears that some parts of this are epithelializing but she still has a distal and proximal divot. The proximal divot was where the exposed bone was initially although there is none of that now. There is no evidence of surrounding infection. Using a #3 curet I remove necrotic surface debris from the 2 deeper divots of this wound. Hemostasis with direct pressure. Post-debridement the areas that aren't epithelialized look healthier. I am cautiously encouraged Integumentary (Hair, Skin) Wound #7 status is Open. Original cause of wound was Gradually Appeared. The wound is located on the Right,Dorsal Metatarsal head first. The wound measures 2cm length x 0.6cm width x 0.5cm depth; 0.942cm^2  area and 0.471cm^3 volume. There is Fat Layer (Subcutaneous Tissue) Exposed exposed. There is no tunneling or undermining noted. There is a large amount of serous drainage noted. The wound margin is flat and intact. There is small (1-33%) red granulation within the wound bed. There is a medium (34-66%) amount of necrotic tissue within the wound bed including Adherent Slough. The periwound skin appearance exhibited: Induration, Maceration, Hemosiderin Staining. The periwound skin appearance did not exhibit: Callus, Crepitus, Excoriation, Rash, Scarring, Dry/Scaly, Atrophie Blanche, Cyanosis, Ecchymosis, Mottled, Pallor, Rubor, Erythema. Assessment Active Problems ICD-10 E11.622 - Type 2 diabetes mellitus with other skin ulcer E11.621 - Type 2 diabetes mellitus with foot ulcer [name], [name] 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 There was a Skin/Subcutaneous Tissue Debridement ([id number]) debridement with total area of 1.2 sq cm performed by Ricard Dillon, MD. with the following instrument(s): Curette to remove Viable and Non-Viable tissue/material including Fibrin/Slough and Subcutaneous after achieving pain control using Other (lidocaine 4%). A  time out was conducted at 10:35, prior to the start of the procedure. A Minimum amount of bleeding was controlled with Pressure. The  procedure was tolerated well with a pain level of 0 throughout and a pain level of 0 following the procedure. Post Debridement Measurements: 2cm length x 0.6cm width x 0.5cm depth; 0.471cm^3 volume. Character of Wound/Ulcer Post Debridement is stable. Severity of Tissue Post Debridement is: Fat layer exposed. Post procedure Diagnosis Wound #7: Same as Pre-Procedure Plan Wound Cleansing: Wound #7 Right,Dorsal Metatarsal head first: Clean wound with Normal Saline. Cleanse wound with mild soap and water Anesthetic: Wound #7 Right,Dorsal Metatarsal head first: Topical Lidocaine 4% cream applied to wound bed prior to debridement Primary Wound Dressing: Wound #7 Right,Dorsal Metatarsal head first: Other: - ENDOFORM-moisten before applying and place onto/into wound bed. Secondary Dressing: Wound #7 Right,Dorsal Metatarsal head first: Dry Gauze Conform/Kerlix Drawtex Dressing Change Frequency: All, Estefani V. ([id number]) Wound #7 Right,Dorsal Metatarsal head first: Change Dressing Monday, Wednesday, Friday Follow-up Appointments: Wound #7 Right,Dorsal Metatarsal head first: Return Appointment in 1 week. Edema Control: Wound #7 Right,Dorsal Metatarsal head first: Elevate legs to the level of the heart and pump ankles as often as possible Additional Orders / Instructions: Wound #7 Right,Dorsal Metatarsal head first: Increase protein intake. Activity as tolerated Home Health: Wound #7 Right,Dorsal Metatarsal head first: [facility] weekly, [facility] Home Health Nurse may visit PRN to address patient s wound care needs. FACE TO FACE ENCOUNTER: MEDICARE and MEDICAID PATIENTS: I certify that this patient is under my care and that I had a face-to-face encounter that meets the physician face-to-face encounter requirements with this patient on this date. The encounter with the patient was in whole or in part for the following MEDICAL  CONDITION: (primary reason for [place]) MEDICAL NECESSITY: I certify, that based on my findings, NURSING services are a medically necessary home health service. HOME BOUND STATUS: I certify that my clinical findings support that this patient is homebound (i.e., Due to illness or injury, pt requires aid of supportive devices such as crutches, cane, wheelchairs, walkers, the use of special transportation or the assistance of another person to leave their place of residence. There is a normal inability to leave the home and doing so requires considerable and taxing effort. Other absences are for medical reasons / religious services and are infrequent or of short duration when for other reasons). If current dressing causes regression in wound condition, may D/C ordered dressing product/s and apply Normal Saline Moist Dressing daily until next [place] / Other MD appointment. [place] of regression in wound condition at [phone]. Please direct any NON-WOUND related issues/requests for orders to patient's Primary Care Physician #1 I'm continuing the Endoform based on the fact I'm still seeing continued improvements and the really dramatic improvements we saw initially #2 there close to their annual out of pocket max could consider Regranex and/or Oasis [single-layer] Electronic Signature(s) Signed: 05/07/2017 11:00:35 AM By: Linton Ham MD Entered By: Linton Ham on 05/07/2017 11:00:35 [name], Parmele. ([id number]) -------------------------------------------------------------------------------- SuperBill Details Patient Name: Nieland, Tashia V. Date of Service: 05/07/2017 Medical Record Patient Account Number: [id number] Number: Treating RN: Cornell Barman [birth year] (76 y.o. 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 I70.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 of foot Facility Procedures CPT4: Description Modifier Quantity Code [id number] 11042 - DEB SUBQ TISSUE 20 SQ CM/< 1 ICD-10 Description Diagnosis I70.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 of foot Physician Procedures CPT4: Description Modifier Quantity Code [id number] 97588 - WC PHYS SUBQ TISS 20 SQ CM 1 ICD-10 Description Diagnosis I70.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 of foot Electronic Signature(s) Signed: 05/07/2017 4:23:55 PM By: Linton Ham MD Entered By: Linton Ham on 05/07/2017 10:59:13

## 2017-05-14 ENCOUNTER — Encounter: Payer: Medicare HMO | Admitting: Internal Medicine

## 2017-05-14 DIAGNOSIS — E11622 Type 2 diabetes mellitus with other skin ulcer: Secondary | ICD-10-CM | POA: Diagnosis not present

## 2017-05-18 NOTE — Progress Notes (Signed)
KAYREN, HOLCK ([id number]) Visit Report for 05/14/2017 Arrival Information Details Patient Name: Romig, CLAUDENE GATLIFF. Date of Service: 05/14/2017 10:15 AM Medical Record Number: [id number] Patient Account Number: [id number] Date of Birth/Sex: [birth year] (76 y.o. Female) Treating RN: Cornell Barman Primary Care Jailon Schaible: Beverlyn Roux Other Clinician: Referring Aiman Noe: Beverlyn Roux Treating Gerarda Conklin/Extender: Tito Dine in Treatment: 69 Visit Information History Since Last Visit Added or deleted any medications: No Patient Arrived: Wheel Chair Any new allergies or adverse reactions: No Arrival Time: 10:28 Had a fall or experienced change in No Accompanied By: husband, activities of daily living that may affect [name] risk of falls: Transfer Assistance: Manual Signs or symptoms of abuse/neglect since last No Patient Identification Verified: Yes visito Secondary Verification Process Yes Hospitalized since last visit: No Completed: Has Dressing in Place as Prescribed: Yes Patient Requires Transmission- No Pain Present Now: No Based Precautions: Patient Has Alerts: No Electronic Signature(s) Signed: 05/16/2017 4:29:15 PM By: Gretta Cool, BSN, RN, CWS, Kim RN, BSN Entered By: Gretta Cool, BSN, RN, CWS, Kim on 05/14/2017 10:33:09 Diekman, [name] ([id number]) -------------------------------------------------------------------------------- Clinic Level of Care Assessment Details Patient Name: Sisler, Enolia V. Date of Service: 05/14/2017 10:15 AM Medical Record Number: [id number] Patient Account Number: [id number] Date of Birth/Sex: 12/04/1940 (76 y.o. Female) Treating RN: Cornell Barman Primary Care Anjenette Gerbino: Beverlyn Roux Other Clinician: Referring Mable Dara: Beverlyn Roux Treating Makale Pindell/Extender: Tito Dine in Treatment: 27 Clinic Level of Care Assessment Items TOOL 4 Quantity Score []  - Use when only an EandM is performed on FOLLOW-UP visit 0 ASSESSMENTS - Nursing  Assessment / Reassessment []  - Reassessment of Co-morbidities (includes updates in patient status) 0 X - Reassessment of Adherence to Treatment Plan 1 5 ASSESSMENTS - Wound and Skin Assessment / Reassessment X - Simple Wound Assessment / Reassessment - one wound 1 5 []  - Complex Wound Assessment / Reassessment - multiple wounds 0 []  - Dermatologic / Skin Assessment (not related to wound area) 0 ASSESSMENTS - Focused Assessment []  - Circumferential Edema Measurements - multi extremities 0 []  - Nutritional Assessment / Counseling / Intervention 0 []  - Lower Extremity Assessment (monofilament, tuning fork, pulses) 0 []  - Peripheral Arterial Disease Assessment (using hand held doppler) 0 ASSESSMENTS - Ostomy and/or Continence Assessment and Care []  - Incontinence Assessment and Management 0 []  - Ostomy Care Assessment and Management (repouching, etc.) 0 PROCESS - Coordination of Care X - Simple Patient / Family Education for ongoing care 1 15 []  - Complex (extensive) Patient / Family Education for ongoing care 0 X - Staff obtains Programmer, systems, Records, Test Results / Process Orders 1 10 []  - Staff telephones HHA, Nursing Homes / Clarify orders / etc 0 []  - Routine Transfer to another Facility (non-emergent condition) 0 Garringer, Cleon V. ([id number]) []  - Routine Hospital Admission (non-emergent condition) 0 []  - New Admissions / Biomedical engineer / Ordering NPWT, Apligraf, etc. 0 []  - Emergency Hospital Admission (emergent condition) 0 X - Simple Discharge Coordination 1 10 []  - Complex (extensive) Discharge Coordination 0 PROCESS - Special Needs []  - Pediatric / Minor Patient Management 0 []  - Isolation Patient Management 0 []  - Hearing / Language / Visual special needs 0 []  - Assessment of Community assistance (transportation, D/C planning, etc.) 0 []  - Additional assistance / Altered mentation 0 []  - Support Surface(s) Assessment (bed, cushion, seat, etc.) 0 INTERVENTIONS - Wound  Cleansing / Measurement X - Simple Wound Cleansing - one wound 1 5 []  - Complex Wound Cleansing - multiple wounds 0 X -  Wound Imaging (photographs - any number of wounds) 1 5 []  - Wound Tracing (instead of photographs) 0 X - Simple Wound Measurement - one wound 1 5 []  - Complex Wound Measurement - multiple wounds 0 INTERVENTIONS - Wound Dressings X - Small Wound Dressing one or multiple wounds 1 10 []  - Medium Wound Dressing one or multiple wounds 0 []  - Large Wound Dressing one or multiple wounds 0 []  - Application of Medications - topical 0 []  - Application of Medications - injection 0 INTERVENTIONS - Miscellaneous []  - External ear exam 0 Hasting, Marly V. ([id number]) []  - Specimen Collection (cultures, biopsies, blood, body fluids, etc.) 0 []  - Specimen(s) / Culture(s) sent or taken to Lab for analysis 0 []  - Patient Transfer (multiple staff / Harrel Lemon Lift / Similar devices) 0 []  - Simple Staple / Suture removal (25 or less) 0 []  - Complex Staple / Suture removal (26 or more) 0 []  - Hypo / Hyperglycemic Management (close monitor of Blood Glucose) 0 []  - Ankle / Brachial Index (ABI) - do not check if billed separately 0 X - Vital Signs 1 5 Has the patient been seen at the hospital within the last three years: Yes Total Score: 75 Level Of Care: New/Established - Level 2 Electronic Signature(s) Signed: 05/16/2017 4:29:15 PM By: Gretta Cool, BSN, RN, CWS, Kim RN, BSN Entered By: Gretta Cool, BSN, RN, CWS, Kim on 05/14/2017 12:37:24 Sarafian, [name] ([id number]) -------------------------------------------------------------------------------- Encounter Discharge Information Details Patient Name: Azar, Lakea V. Date of Service: 05/14/2017 10:15 AM Medical Record Number: [id number] Patient Account Number: [id number] Date of Birth/Sex: 11-08-1940 (76 y.o. Female) Treating RN: Cornell Barman Primary Care Drakkar Medeiros: Beverlyn Roux Other Clinician: Referring Kieon Lawhorn: Beverlyn Roux Treating Lenord Fralix/Extender:  Tito Dine in Treatment: 53 Encounter Discharge Information Items Discharge Pain Level: 0 Discharge Condition: Stable Ambulatory Status: Wheelchair Discharge Destination: Home Transportation: Private Auto Accompanied By: husband Schedule Follow-up Appointment: Yes Medication Reconciliation completed and provided to Patient/Care Yes Marthena Whitmyer: Provided on Clinical Summary of Care: 05/14/2017 Form Type Recipient Paper Patient [facility] Electronic Signature(s) Signed: 05/14/2017 12:44:17 PM By: Gretta Cool, BSN, RN, CWS, Kim RN, BSN Entered By: Gretta Cool, BSN, RN, CWS, Kim on 05/14/2017 12:44:17 Jago, [name] ([id number]) -------------------------------------------------------------------------------- Lower Extremity Assessment Details Patient Name: Broom, Shlonda V. Date of Service: 05/14/2017 10:15 AM Medical Record Number: [id number] Patient Account Number: [id number] Date of Birth/Sex: 1941/07/18 (76 y.o. Female) Treating RN: Cornell Barman Primary Care Reannah Totten: Beverlyn Roux Other Clinician: Referring Skylor Hughson: Beverlyn Roux Treating Dennison Mcdaid/Extender: Tito Dine in Treatment: 27 Vascular Assessment Pulses: Dorsalis Pedis Palpable: [Right:No] Doppler Audible: [Right:Yes] Posterior Tibial Extremity colors, hair growth, and conditions: Extremity Color: [Right:Hyperpigmented] Hair Growth on Extremity: [Right:No] Temperature of Extremity: [Right:Warm] Capillary Refill: [Right:> 3 seconds] Dependent Rubor: [Right:No] Blanched when Elevated: [Right:No] Lipodermatosclerosis: [Right:No] Toe Nail Assessment Left: Right: Thick: Yes Discolored: Yes Deformed: Yes Improper Length and Hygiene: Yes Notes Pitting edema +2; Doppler pulse on right very faint Electronic Signature(s) Signed: 05/16/2017 4:29:15 PM By: Gretta Cool, BSN, RN, CWS, Kim RN, BSN Entered By: Gretta Cool, BSN, RN, CWS, Kim on 05/14/2017 10:42:22 Osmon, Monisha [name]  ([id number]) -------------------------------------------------------------------------------- Multi Wound Chart Details Patient Name: Dipierro, Jailen V. Date of Service: 05/14/2017 10:15 AM Medical Record Number: [id number] Patient Account Number: [id number] Date of Birth/Sex: 10/04/1940 (77 y.o. Female) Treating RN: Cornell Barman Primary Care Nikala Walsworth: Beverlyn Roux Other Clinician: Referring Ryleigh Esqueda: Beverlyn Roux Treating Katlynne Mckercher/Extender: Tito Dine in Treatment: 27 Vital Signs Height(in): 64 Pulse(bpm): 72 Weight(lbs): 200 Blood Pressure 139/60 (mmHg): Body  Mass Index(BMI): 34 Temperature(F): 97.8 Respiratory Rate 16 (breaths/min): Photos: [N/A:N/A] Wound Location: Right Metatarsal head first N/A N/A - Dorsal Wounding Event: Gradually Appeared N/A N/A Primary Etiology: Diabetic Wound/Ulcer of N/A N/A the Lower Extremity Comorbid History: Cataracts, Chronic sinus N/A N/A problems/congestion, Congestive Heart Failure, Hypertension, Type II Diabetes Date Acquired: 08/06/2013 N/A N/A Weeks of Treatment: 27 N/A N/A Wound Status: Open N/A N/A Measurements L x W x D 2x0.6x0.5 N/A N/A (cm) Area (cm) : 0.942 N/A N/A Volume (cm) : 0.471 N/A N/A % Reduction in Area: 20.00% N/A N/A % Reduction in Volume: -33.40% N/A N/A Classification: Grade 2 N/A N/A Exudate Amount: Large N/A N/A Exudate Type: Serous N/A N/A Exudate Color: amber N/A N/A Wound Margin: Flat and Intact N/A N/A Franchino, Emberlin V. ([id number]) Granulation Amount: Large (67-100%) N/A N/A Granulation Quality: Red N/A N/A Necrotic Amount: Small (1-33%) N/A N/A Exposed Structures: Fat Layer (Subcutaneous N/A N/A Tissue) Exposed: Yes Fascia: No Tendon: No Muscle: No Joint: No Bone: No Epithelialization: Small (1-33%) N/A N/A Periwound Skin Texture: Induration: Yes N/A N/A Excoriation: No Callus: No Crepitus: No Rash: No Scarring: No Periwound Skin Maceration: Yes N/A N/A Moisture: Dry/Scaly:  No Periwound Skin Color: Hemosiderin Staining: Yes N/A N/A Atrophie Blanche: No Cyanosis: No Ecchymosis: No Erythema: No Mottled: No Pallor: No Rubor: No Tenderness on No N/A N/A Palpation: Wound Preparation: Ulcer Cleansing: N/A N/A Rinsed/Irrigated with Saline Topical Anesthetic Applied: Other: lidocaine 4% Treatment Notes Electronic Signature(s) Signed: 05/14/2017 4:12:03 PM By: Linton Ham MD Entered By: Linton Ham on 05/14/2017 11:07:01 Wallington, Tyreonna [name] ([id number]) -------------------------------------------------------------------------------- Multi-Disciplinary Care Plan Details Patient Name: Lopez, Rhodesia V. Date of Service: 05/14/2017 10:15 AM Medical Record Number: [id number] Patient Account Number: [id number] Date of Birth/Sex: 1941/04/19 (76 y.o. Female) Treating RN: Cornell Barman Primary Care Malachy Coleman: Beverlyn Roux Other Clinician: Referring Tianna Baus: Beverlyn Roux Treating Nima Bamburg/Extender: Tito Dine in Treatment: 19 Active Inactive ` Abuse / Safety / Falls / Self Care Management Nursing Diagnoses: Impaired physical mobility Potential for falls Goals: Patient will remain injury free Date Initiated: 11/06/2016 Target Resolution Date: 12/30/2016 Goal Status: Active Patient/caregiver will verbalize understanding of skin care regimen Date Initiated: 11/06/2016 Target Resolution Date: 12/30/2016 Goal Status: Active Interventions: Assess fall risk on admission and as needed Treatment Activities: Patient referred to home care : 11/06/2016 Notes: ` Nutrition Nursing Diagnoses: Potential for alteratiion in Nutrition/Potential for imbalanced nutrition Goals: Patient/caregiver verbalizes understanding of need to maintain therapeutic glucose control per primary care physician Date Initiated: 11/06/2016 Target Resolution Date: 12/30/2016 Goal Status: Active Interventions: Provide education on elevated blood sugars and impact on wound  healing Farooqui, Carlynn V. ([id number]) Notes: ` Orientation to the Wound Care Program Nursing Diagnoses: Knowledge deficit related to the wound healing center program Goals: Patient/caregiver will verbalize understanding of the [facility] Date Initiated: 11/06/2016 Target Resolution Date: 12/30/2016 Goal Status: Active Interventions: Provide education on orientation to the wound center Notes: ` Venous Leg Ulcer Nursing Diagnoses: Actual venous Insuffiency (use after diagnosis is confirmed) Knowledge deficit related to disease process and management Goals: Non-invasive venous studies are completed as ordered Date Initiated: 11/06/2016 Target Resolution Date: 12/30/2016 Goal Status: Active Patient/caregiver will verbalize understanding of disease process and disease management Date Initiated: 11/06/2016 Target Resolution Date: 12/30/2016 Goal Status: Active Interventions: Assess peripheral edema status every visit. Notes: ` Wound/Skin Impairment Nursing Diagnoses: Impaired tissue integrity Knowledge deficit related to smoking impact on wound healing Knowledge deficit related to ulceration/compromised skin integrity Goals: Baruch, Antigone V. ([id number])  Ulcer/skin breakdown will heal within 14 weeks Date Initiated: 11/06/2016 Target Resolution Date: 02/05/2017 Goal Status: Active Interventions: Assess ulceration(s) every visit Treatment Activities: Skin care regimen initiated : 11/06/2016 Notes: Electronic Signature(s) Signed: 05/16/2017 4:29:15 PM By: Gretta Cool, BSN, RN, CWS, Kim RN, BSN Entered By: Gretta Cool, BSN, RN, CWS, Kim on 05/14/2017 10:50:40 Stillinger, Alonah [name] ([id number]) -------------------------------------------------------------------------------- Pain Assessment Details Patient Name: Marquart, Minnah V. Date of Service: 05/14/2017 10:15 AM Medical Record Number: [id number] Patient Account Number: [id number] Date of Birth/Sex: Aug 14, 1940 (76 y.o. Female) Treating RN:  Cornell Barman Primary Care Gil Ingwersen: Beverlyn Roux Other Clinician: Referring Giordana Weinheimer: Beverlyn Roux Treating Taryne Kiger/Extender: Tito Dine in Treatment: 27 Active Problems Location of Pain Severity and Description of Pain Patient Has Paino No Site Locations With Dressing Change: No Pain Management and Medication Current Pain Management: Goals for Pain Management Topical or injectable lidocaine is offered to patient for acute pain when surgical debridement is performed. If needed, Patient is instructed to use over the counter pain medication for the following 24-48 hours after debridement. Wound care MDs do not prescribed pain medications. Patient has chronic pain or uncontrolled pain. Patient has been instructed to make an appointment with their Primary Care Physician for pain management. Electronic Signature(s) Signed: 05/16/2017 4:29:15 PM By: Gretta Cool, BSN, RN, CWS, Kim RN, BSN Entered By: Gretta Cool, BSN, RN, CWS, Kim on 05/14/2017 10:33:39 Prather, [name] ([id number]) -------------------------------------------------------------------------------- Wound Assessment Details Patient Name: Milsap, Jaspreet V. Date of Service: 05/14/2017 10:15 AM Medical Record Number: [id number] Patient Account Number: [id number] Date of Birth/Sex: 29-Jan-1941 (76 y.o. Female) Treating RN: Cornell Barman Primary Care Draedyn Weidinger: Beverlyn Roux Other Clinician: Referring Maury Groninger: Beverlyn Roux Treating Alyona Romack/Extender: Tito Dine in Treatment: 27 Wound Status Wound Number: 7 Primary Diabetic Wound/Ulcer of the Lower Etiology: Extremity Wound Location: Right Metatarsal head first - Dorsal Wound Open Status: Wounding Event: Gradually Appeared Comorbid Cataracts, Chronic sinus Date Acquired: 08/06/2013 History: problems/congestion, Congestive Heart Weeks Of Treatment: 27 Failure, Hypertension, Type II Diabetes Clustered Wound: No Photos Wound Measurements Length: (cm) 2 Width: (cm)  0.6 Depth: (cm) 0.5 Area: (cm) 0.942 Volume: (cm) 0.471 % Reduction in Area: 20% % Reduction in Volume: -33.4% Epithelialization: Small (1-33%) Tunneling: No Undermining: No Wound Description Classification: Grade 2 Wound Margin: Flat and Intact Exudate Amount: Large Exudate Type: Serous Exudate Color: amber Foul Odor After Cleansing: No Slough/Fibrino Yes Wound Bed Granulation Amount: Large (67-100%) Exposed Structure Granulation Quality: Red Fascia Exposed: No Necrotic Amount: Small (1-33%) Fat Layer (Subcutaneous Tissue) Exposed: Yes Necrotic Quality: Adherent Slough Tendon Exposed: No Muscle Exposed: No Joint Exposed: No Bone Exposed: No Monical, Fiorella V. ([id number]) Periwound Skin Texture Texture Color No Abnormalities Noted: No No Abnormalities Noted: No Callus: No Atrophie Blanche: No Crepitus: No Cyanosis: No Excoriation: No Ecchymosis: No Induration: Yes Erythema: No Rash: No Hemosiderin Staining: Yes Scarring: No Mottled: No Pallor: No Moisture Rubor: No No Abnormalities Noted: No Dry / Scaly: No Maceration: Yes Wound Preparation Ulcer Cleansing: Rinsed/Irrigated with Saline Topical Anesthetic Applied: Other: lidocaine 4%, Treatment Notes Wound #7 (Right, Dorsal Metatarsal head first) 1. Cleansed with: Clean wound with Normal Saline 2. Anesthetic Topical Lidocaine 4% cream to wound bed prior to debridement 4. Dressing Applied: Hydrafera Blue 5. Secondary Dressing Applied ABD and Kerlix/Conform 6. Footwear/Offloading device applied Surgical shoe 7. Secured with Recruitment consultant) Signed: 05/16/2017 4:29:15 PM By: Gretta Cool, BSN, RN, CWS, Kim RN, BSN Entered By: Gretta Cool, BSN, RN, CWS, Kim on 05/14/2017 10:40:37 Saephan, [name] ([id number]) -------------------------------------------------------------------------------- Vitals Details Patient  Name: Moctezuma, Aroura V. Date of Service: 05/14/2017 10:15 AM Medical Record Number:  [id number] Patient Account Number: [id number] Date of Birth/Sex: July 30, 1941 (76 y.o. Female) Treating RN: Cornell Barman Primary Care Pradyun Ishman: Beverlyn Roux Other Clinician: Referring Cavan Bearden: Beverlyn Roux Treating [name]/Extender: Tito Dine in Treatment: 27 Vital Signs Time Taken: 10:33 Temperature (F): 97.8 Height (in): 64 Pulse (bpm): 72 Weight (lbs): 200 Respiratory Rate (breaths/min): 16 Body Mass Index (BMI): 34.3 Blood Pressure (mmHg): 139/60 Reference Range: 80 - 120 mg / dl Electronic Signature(s) Signed: 05/16/2017 4:29:15 PM By: Gretta Cool, BSN, RN, CWS, Kim RN, BSN Entered By: Gretta Cool, BSN, RN, CWS, Kim on 05/14/2017 10:37:00

## 2017-05-18 NOTE — Progress Notes (Signed)
[name] ([id number]) Visit Report for 05/14/2017 HPI Details Patient Name: White, [name] Date of Service: 05/14/2017 10:15 AM Medical Record Patient Account Number: [id number] Number: Treating RN: [name] Apr 02, 1941 (76 y.o. Other Clinician: Date of Birth/Sex: Female) Treating [name] Primary Care Provider: Beverlyn White Provider/Extender: G Referring Provider: Dorthula White in Treatment: 49 History of Present Illness HPI Description: 08/13/16: This is a 76 year old woman who came predominantly for review of 3 cm in diameter circular wound to the left anterior lateral leg. She was in the ER on 08/01/16 I reviewed their notes. There was apparently pus coming out of the wound at that time and the patient arrived requesting debridement which they don't do in the emergency room. Nevertheless I can't see that they did any x-rays. There were no cultures done. She is a type II diabetic and I a note after the patient was in the clinic that she had a bypass graft from the popliteal to the tibial on the right on 02/28/16. She also had a right greater saphenous vein harvest on the same date for arterial bypass. She is going to have vascular studies including ABIs T ABIs on the right on 08/28/16. The patient's surgery was on 02/28/16 by Dr. Vallarie Mare she had a right below the knee popliteal artery to peroneal artery bypass with reverse greater saphenous vein and an endarterectomy of the mid segment peroneal artery. Postoperatively she had a strong mild monophasic peroneal signal with a pink foot. It would appear that the patient is had some nonhealing in the surgical saphenous vein harvest site on the left leg. Surprisingly looking [facility] I cannot see much information about this at all. Dr. Lucious Groves notes from 05/29/16 show that the patient's wounds "are not healed" the right first metatarsal wound healed but then opened back up. 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 At that point the patient had a wound VAC to her right calf with regards to the wounds on her dorsal right toe would appear that these are felt to be arterial wounds. She has had surgery on the metatarsal phalangeal in 2015 I Dr. Doran Durand secondary to a right metatarsal phalangeal joint fracture. She is apparently had discoloration around this area since then. 08/28/16; patient arrives with her wounds in much the same condition. The linear vein harvest site and the circular wound below it which I think was a blister. She also has to probing holes in her right great toe and a necrotic eschar on the right second toe. Because of these being arterial wounds I reduced her compression from 3-2 layers this seems to of done satisfactorily she has not had any problems. I cannot see that she is actually had an x-ray ====== 11/06/16 the patient comes in for evaluation of her right lower extremity ulcers. She was here in January 2018 for 2 visits subsequently ended up in the hospital with pneumonia and then to rehabilitation. She has now been discharged from rehabilitation and is home. She has multiple ulcerations to her right lower extremity including the foot and toes. She does have home health in place and they have been placing alginate to the [name], [name] ([id number]) ulcers. She is followed by Dr. Bridgett Larsson of vascular medicine. 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 She recently saw him on 3/23.  In office ABIs were: Right 0.48 with monophasic flow to the DP, PT, peroneal Left 0.63 with monophasic flow to the DP and PT Her arterial studies indicated a patent right below knee popliteal to peroneal bypass She had an MRI in February 2008 that was negative frosty myelitis but this showed general soft tissue edema in the  right foot and lower extremity concerning for cellulitis She is a diabetic, managed with insulin. Her hemoglobin A1c in December 2017 was 8.4 which is a trend up from previous levels. She had blood work in February 2018 which revealed an albumin of 2.6 this appears to be relatively acute as an albumin in November 2017 was 3.7 11/13/16; this is a patient I have not seen since February who is readmitted to our clinic last week. 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 Bridgett Larsson. I have reviewed Dr. Lianne Moris notes from March/23/18. Doppler ABI on that date showed an ABI on the right of 0.48 and on the left of 0.63. Dorsalis pedis waveforms were monophasic bilaterally. There was no waveforms detected at the posterior tibial on the right, monophasic on the left. Dr. Lianne Moris comments were that this patient would have follow-up vascular studies in 3 months including ABIs and right lower extremity arterial duplex. She had an MRI in February that was negative for osteomyelitis but showed generalized edema in the foot. Last albumin I see was in January at 3.4 we have been using Santyl to the 4 wounds on the right leg the patient is noted today to have widespread edema well up towards her groin this is pitting 2-3+. 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 Normal ejection fraction. 11/20/16; patient has a follow-up appointment with Dr. Bridgett Larsson on April 23. She is still complaining of a lot of pain in the right foot and right leg. It is not clear to me that this is at all positional however I think it is clear claudication with minimal activity perhaps at rest. At our suggestion she is return to her primary physician's office tomorrow with regards to her pitting lower extremity bilateral edema that I reviewed in detail last week 11/27/16; the follow-up with Dr. Bridgett Larsson was actually on May 23 on  April 23 as I stated in my note last week. N/A case all of her wounds seems somewhat smaller. The 2 on the right leg are definitely smaller. The areas on the dorsal right first toe, right third toe and the lateral part of the right fifth metatarsal head all looks smaller but have tightly adherent surfaces. We have been using Santyl 12/04/16; follow-up with Dr. Bridgett Larsson on May 23. 2 small open wounds on the right leg continue to get smaller. The area on the right third total lateral aspect of the right fifth toe also look better. The remaining area on the dorsal first toe still has some depth to it. We have been using Santyl to the toes and collagen on the right 12/11/16; according to patient's husband the follow-up with Dr. Bridgett Larsson is not until July. All of the wounds on the right leg are measuring smaller. We have been using some combination of Prisma and Santyl although I think we can go to straight Prisma today. There may have been some confusion with home health about the primary dressing orders here. 12/25/16; the patient has had some healing this week. The area on her right  lateral fifth metatarsal head, right third toe are both healed lower right leg is healed. In the vein harvest site superiorly she has one superficial open area. On the dorsal aspect of her right great toe/MTP joint the wound is now divided into 2 however the proximal area is deep and there is palpable bone 01/01/17; still an open area in the middle of her original right surgical scar. The area on the right third toe and right lateral fifth metatarsal head remained closed. Problematic area on the dorsal aspect of the toe. Previous surgery in this area line 01/08/17 small open area on the original scar on her upper anterior leg although this is closing. X-ray I did of the right first toe did not show underlying bony abnormality. Still this area on the dorsal first toe probes to bone. We have been using Shubert, [name]  ([id number]) 01/15/17; small open area in the original scar in the upper anterior leg is almost fully closed. She has 2 open areas over the dorsal aspect of the right first toe that probes to bone. Used and a form starting last week. Vigorous bone scraping that I did last week showed few methicillin sensitive staph aureus. She is allergic to penicillin and sulfa drugs. I'm going to give her 2 weeks of doxycycline. She will need an MRI with contrast. 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 She continues on doxycycline for a bone scraping that showed methicillin sensitive staph aureus [allergic to penicillin and sulfa]. We have been using Endo form to the wound. 01/29/17; surprisingly her MRI did not show osteomyelitis. She continues on doxycycline for a bone scraping that showed methicillin sensitive staph aureus with allergies to penicillin and sulfa. We have been using Endo form to the wound. Unfortunately I cannot get a surface on this visit looks like it is able to support a healing state. Proximally there is still exposed bone. There is no overt soft tissue tenderness. Her MRI did show a previous fracture was surgery to this area but no hardware 02/12/17 on evaluation today patient appears to be doing well in regard to her lower extremity wound. She does have some mild discomfort but this is minimal. There's no evidence of infection. Her left great toe nail has somewhat been lifted up and there is a little bit of slight bleeding underneath but this is still firmly attached. 02/19/17; she ran out of doxycycline 2 days ago. Nevertheless I would like to continue this for 2 weeks to make a full 6 weeks of therapy as the bone scraping that I did from the open area on the dorsal right first toe showed MRSA. This should complete antibiotic therapy. 02/26/17; the patient is completing her 6 weeks of oral doxycycline. Deep  wound on the dorsal right toe. This still has some exposed bone proximally. She does have a reasonably granulated surface albeit thin over bone. I've been using Endo form have made application for an amniotic skin sub 03/05/17; the patient has completed 6 weeks of doxycycline. This is a deep wound on the dorsal right toe which has exposed bone proximally. She has granulation over most of this wound albeit a thin layer. I've been using Endo form. Still do not have approval for Affinity 03/13/17 on evaluation today patient's right foot wound appears to be doing better measurement wise compared to her last evaluation. She has been tolerating the dressing  change without complication and does have a appointment with the dietary that has been made as far as referral is concerned there just waiting for her and transportation to contact them back for an actual date. Nonetheless patient has been having less pain at this point. No fevers, chills, nausea, or vomiting noted at this time. 03/26/17; linear wound over the dorsal aspect of her right great toe. At one point this had exposed bone on the most superior aspect however I am pleased to see today that this appears to have a surface of granulation. Apparently product was applied for but denied by [facility] We'll need to see what that was. The patient has known PAD status post revascularization. MRI did not show osteomyelitis which was done in June 04/02/17; continued improvement using Endoform. She was approved for [employer] but hasn't over $962 co-pay per application, this is beyond her means 04/09/17; continues on Endoform. 04/16/17; appears to be doing nicely continuing on Endoform 04/22/17; patient did not have her dressing changed all week because of the weather. Using Endoform. Base of the wound looks healthy elbow there appears to events surrounding maceration perhaps because of the drainage was not changing the wound dressing 04/30/17 wound today continues to  close and except for a small divot on the superior part of the wound. This area did not probe the bone but I found this a little concerning as this was the area with exposed bone before we be able to get this to granulate forward. As I remember things she is not a candidate for skin substitutes secondary to an outlandish co-pay. I asked her husband to look into the out of pocket max for medications if he has 1 [Regranex] or totally for skin substitutes 05/07/17; the patient arrives today with a wound roughly the same size although under careful inspection under the light there appears to be some epithelialization. Her intake nurse noted that the Endoform seemed to be placed over the wound rather than in the deeper divots they could really benefit from the Dibuono, Matina V. ([id number]) Endoform. At this point I have no plans to change the Endoform as at one point this was at least 33% exposed bone and the rest of the wound very close to the underlying phalanx 05/14/17; no major change in the size or appearance of the wound. We have been using Endoform for a prolonged period of time with reasonably good improvement in the epithelialization i.e. no exposed bone especially proximally. Unless we've not made a lot of changes in the last several weeks. She does not complain of pain. She was revascularized early this year or perhaps late last year by Dr. Bridgett Larsson and I wonder if he will need to have a look at her, I will need to review the actual vascular procedure which I think was a distal popliteal bypass Electronic Signature(s) Signed: 05/14/2017 4:12:03 PM By: Linton Ham MD Entered By: Linton Ham on 05/14/2017 11:10:05 Badeaux, [name] ([id number]) -------------------------------------------------------------------------------- Physical Exam Details Patient Name: [name], [name] Date of Service: 05/14/2017 10:15 AM Medical Record Patient Account Number: [id number] Number: Treating  RN: [name] 1940-12-10 (76 y.o. Other Clinician: Date of Birth/Sex: Female) Treating Aanyah Loa Primary Care Provider: Beverlyn White Provider/Extender: G Referring Provider: Dorthula White in Treatment: 27 Constitutional Sitting or standing Blood Pressure is within target range for patient.. Pulse regular and within target range for patient.[name] Respirations regular, non-labored and within target range.. Temperature is normal and within the target range for the  patient.[name] appears in no distress. Eyes Conjunctivae clear. No discharge. Respiratory Respiratory effort is easy and symmetric bilaterally. Rate is normal at rest and on room air.. Cardiovascular Pedal pulse absent in left extremity.. Lymphatic None palpable in the left popliteal or inguinal area. Psychiatric No evidence of depression, anxiety, or agitation. Calm, cooperative, and communicative. Appropriate interactions and affect.. Notes Wound exam; we have made good progress with the Endoform in terms of granulation and there is no longer any exposed bone in however we have stagnated in the last several weeks. There is no overt infection of this wound of the soft tissue. There is a small amount of undermining proximally although this is not changed recently either. Electronic Signature(s) Signed: 05/14/2017 4:12:03 PM By: Linton Ham MD Entered By: Linton Ham on 05/14/2017 11:11:50 Bake, Modena [name] ([id number]) -------------------------------------------------------------------------------- Physician Orders Details Patient Name: Christiana, Anet V. Date of Service: 05/14/2017 10:15 AM Medical Record Patient Account Number: [id number] Number: Treating RN: [name] 1941-07-29 (76 y.o. Other Clinician: Date of Birth/Sex: Female) Treating Loranzo Desha Primary Care Provider: Beverlyn White Provider/Extender: G Referring Provider: Dorthula White in Treatment: 30 Verbal / Phone Orders:  No Diagnosis Coding Wound Cleansing Wound #7 Right,Dorsal Metatarsal head first o Clean wound with Normal Saline. o Cleanse wound with mild soap and water Anesthetic Wound #7 Right,Dorsal Metatarsal head first o Topical Lidocaine 4% cream applied to wound bed prior to debridement Primary Wound Dressing Wound #7 Right,Dorsal Metatarsal head first o Hydrafera Blue Secondary Dressing Wound #7 Right,Dorsal Metatarsal head first o ABD pad o Conform/Kerlix Dressing Change Frequency Wound #7 Right,Dorsal Metatarsal head first o Change Dressing Monday, Wednesday, Friday Follow-up Appointments Wound #7 Right,Dorsal Metatarsal head first o Return Appointment in 1 week. Edema Control Wound #7 Right,Dorsal Metatarsal head first o Elevate legs to the level of the heart and pump ankles as often as possible Additional Orders / Instructions Wound #7 Right,Dorsal Metatarsal head first o Increase protein intake. Baumgarner, GLADY OUDERKIRK ([id number]) o Activity as tolerated Home Health Wound #7 Right,Dorsal Metatarsal head first o [facility] - [facility] twice weekly, [facility]-Wednesday o Home Health Nurse may visit PRN to address patientos wound care needs. o FACE TO FACE ENCOUNTER: MEDICARE and MEDICAID PATIENTS: I certify that this patient is under my care and that I had a face-to-face encounter that meets the physician face-to-face encounter requirements with this patient on this date. The encounter with the patient was in whole or in part for the following MEDICAL CONDITION: (primary reason for [place]) MEDICAL NECESSITY: I certify, that based on my findings, NURSING services are a medically necessary home health service. HOME BOUND STATUS: I certify that my clinical findings support that this patient is homebound (i.e., Due to illness or injury, pt requires aid of supportive devices such as crutches, cane, wheelchairs, walkers, the use of  special transportation or the assistance of another person to leave their place of residence. There is a normal inability to leave the home and doing so requires considerable and taxing effort. Other absences are for medical reasons / religious services and are infrequent or of short duration when for other reasons). o If current dressing causes regression in wound condition, may D/C ordered dressing product/s and apply Normal Saline Moist Dressing daily until next [place] / Other MD appointment. [place] of regression in wound condition at [phone]. o Please direct any NON-WOUND related issues/requests for orders to patient's Primary Care Physician Notes AUTHORIZATION FOR  [facility] Electronic Signature(s) Signed: 05/14/2017 4:12:03 PM By: Linton Ham MD Signed: 05/16/2017 4:29:15 PM By: Gretta Cool, BSN, RN, CWS, Kim RN, BSN Entered By: Gretta Cool, BSN, RN, CWS, Kim on 05/14/2017 10:53:50 Deam, [name] ([id number]) -------------------------------------------------------------------------------- Problem List Details Patient Name: Najarian, Latesia V. Date of Service: 05/14/2017 10:15 AM Medical Record Patient Account Number: [id number] Number: Treating RN: [name] 1941-07-16 (76 y.o. 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 ([id number]) -------------------------------------------------------------------------------- Progress Note Details Patient Name: Brittle, Lanijah V. Date of Service: 05/14/2017 10:15 AM Medical Record Patient Account Number: [id number] Number: Treating RN: [name] 07/09/1941 (76 y.o. Other Clinician: Date of Birth/Sex: Female) Treating Chana Lindstrom Primary Care Provider: Beverlyn White Provider/Extender: G Referring Provider: Dorthula White in Treatment: 27 Subjective History of Present Illness (HPI) 08/13/16: This is a 76 year old woman who came predominantly for review of 3 cm in diameter circular wound to the left anterior lateral leg. She was in the ER on 08/01/16 I reviewed their notes. There was apparently pus coming out of the wound at that time and the patient arrived requesting debridement which they don't do in the emergency room. Nevertheless I can't see that they did any x-rays. There were no cultures done. She is a type II diabetic and I a note after the patient was in the clinic that she had a bypass graft from the popliteal to the tibial on the right on 02/28/16. She also had a right greater saphenous vein harvest on the same date for arterial bypass. She is going to have vascular studies including ABIs T ABIs on the right on 08/28/16. The patient's surgery was on 02/28/16 by Dr. Vallarie Mare she had a right below the knee popliteal artery to peroneal artery bypass with reverse greater saphenous vein and an endarterectomy of the mid segment peroneal artery.  Postoperatively she had a strong mild monophasic peroneal signal with a pink foot. It would appear that the patient is had some nonhealing in the surgical saphenous vein harvest site on the left leg. Surprisingly looking [facility] I cannot see much information about this at all. Dr. Lucious Groves notes from 05/29/16 show that the patient's wounds "are not healed" the right first metatarsal wound healed but then opened back up. 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 At that point the patient had a wound VAC to her right calf with regards to the wounds on her dorsal right toe would appear that these are felt to be arterial wounds. She has had surgery on the metatarsal phalangeal in 2015 I Dr. Doran Durand  secondary to a right metatarsal phalangeal joint fracture. She is apparently had discoloration around this area since then. 08/28/16; patient arrives with her wounds in much the same condition. The linear vein harvest site and the circular wound below it which I think was a blister. She also has to probing holes in her right great toe and a necrotic eschar on the right second toe. Because of these being arterial wounds I reduced her compression from 3-2 layers this seems to of done satisfactorily she has not had any problems. I cannot see that she is actually had an x-ray ====== 11/06/16 the patient comes in for evaluation of her right lower extremity ulcers. She was here in January 2018 for 2 visits subsequently ended up in the hospital with pneumonia and then to rehabilitation. She has now been discharged from rehabilitation and is home. She has multiple ulcerations to her right lower extremity including the foot and toes. She does have home health in place and they have been placing alginate to the ulcers. She is followed by Dr. Bridgett Larsson of vascular medicine. She is status post a bypass graft  to the right below knee popliteal to peroneal using reverse GSV in July 2017. She recently saw him on 3/23. In office ABIs were: Vogt, BLANNIE SHEDLOCK. ([id number]) Right 0.48 with monophasic flow to the DP, PT, peroneal Left 0.63 with monophasic flow to the DP and PT Her arterial studies indicated a patent right below knee popliteal to peroneal bypass She had an MRI in February 2008 that was negative frosty myelitis but this showed general soft tissue edema in the right foot and lower extremity concerning for cellulitis She is a diabetic, managed with insulin. Her hemoglobin A1c in December 2017 was 8.4 which is a trend up from previous levels. She had blood work in February 2018 which revealed an albumin of 2.6 this appears to be relatively acute as an albumin in November 2017 was 3.7 11/13/16; this is a patient I have not seen since February who is readmitted to our clinic last week. 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 Bridgett Larsson. I have reviewed Dr. Lianne Moris notes from March/23/18. Doppler ABI on that date showed an ABI on the right of 0.48 and on the left of 0.63. Dorsalis pedis waveforms were monophasic bilaterally. There was no waveforms detected at the posterior tibial on the right, monophasic on the left. Dr. Lianne Moris comments were that this patient would have follow-up vascular studies in 3 months including ABIs and right lower extremity arterial duplex. She had an MRI in February that was negative for osteomyelitis but showed generalized edema in the foot. Last albumin I see was in January at 3.4 we have been using Santyl to the 4 wounds on the right leg the patient is noted today to have widespread edema well up towards her groin this is pitting 2-3+. 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 Normal ejection fraction. 11/20/16; patient has a follow-up appointment with  Dr. Bridgett Larsson on April 23. She is still complaining of a lot of pain in the right foot and right leg. It is not clear to me that this is at all positional however I think it is clear claudication with minimal activity perhaps at rest. At our suggestion she is return to her primary physician's office tomorrow with regards to her pitting lower extremity bilateral edema that I reviewed  in detail last week 11/27/16; the follow-up with Dr. Bridgett Larsson was actually on May 23 on April 23 as I stated in my note last week. N/A case all of her wounds seems somewhat smaller. The 2 on the right leg are definitely smaller. The areas on the dorsal right first toe, right third toe and the lateral part of the right fifth metatarsal head all looks smaller but have tightly adherent surfaces. We have been using Santyl 12/04/16; follow-up with Dr. Bridgett Larsson on May 23. 2 small open wounds on the right leg continue to get smaller. The area on the right third total lateral aspect of the right fifth toe also look better. The remaining area on the dorsal first toe still has some depth to it. We have been using Santyl to the toes and collagen on the right 12/11/16; according to patient's husband the follow-up with Dr. Bridgett Larsson is not until July. All of the wounds on the right leg are measuring smaller. We have been using some combination of Prisma and Santyl although I think we can go to straight Prisma today. There may have been some confusion with home health about the primary dressing orders here. 12/25/16; the patient has had some healing this week. The area on her right lateral fifth metatarsal head, right third toe are both healed lower right leg is healed. In the vein harvest site superiorly she has one superficial open area. On the dorsal aspect of her right great toe/MTP joint the wound is now divided into 2 however the proximal area is deep and there is palpable bone 01/01/17; still an open area in the middle of her original right surgical  scar. The area on the right third toe and right lateral fifth metatarsal head remained closed. Problematic area on the dorsal aspect of the toe. Previous surgery in this area line 01/08/17 small open area on the original scar on her upper anterior leg although this is closing. X-ray I did of the right first toe did not show underlying bony abnormality. Still this area on the dorsal first toe probes to bone. We have been using Prisma 01/15/17; small open area in the original scar in the upper anterior leg is almost fully closed. She has 2 open areas over the dorsal aspect of the right first toe that probes to bone. Used and a form starting last week. Vigorous bone scraping that I did last week showed few methicillin sensitive staph aureus. She is allergic to Barba, Adelita V. ([id number]) penicillin and sulfa drugs. I'm going to give her 2 weeks of doxycycline. She will need an MRI with contrast. 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 She continues on doxycycline for a bone scraping that showed methicillin sensitive staph aureus [allergic to penicillin and sulfa]. We have been using Endo form to the wound. 01/29/17; surprisingly her MRI did not show osteomyelitis. She continues on doxycycline for a bone scraping that showed methicillin sensitive staph aureus with allergies to penicillin and sulfa. We have been using Endo form to the wound. Unfortunately I cannot get a surface on this visit looks like it is able to support a healing state. Proximally there is still exposed bone. There is no overt soft tissue tenderness. Her MRI did show a previous fracture was surgery to this area but no hardware 02/12/17 on evaluation today patient appears to be doing well in regard to her lower extremity wound.  She does have some mild discomfort but this is minimal. There's no evidence of infection. Her left great toe nail has somewhat  been lifted up and there is a little bit of slight bleeding underneath but this is still firmly attached. 02/19/17; she ran out of doxycycline 2 days ago. Nevertheless I would like to continue this for 2 weeks to make a full 6 weeks of therapy as the bone scraping that I did from the open area on the dorsal right first toe showed MRSA. This should complete antibiotic therapy. 02/26/17; the patient is completing her 6 weeks of oral doxycycline. Deep wound on the dorsal right toe. This still has some exposed bone proximally. She does have a reasonably granulated surface albeit thin over bone. I've been using Endo form have made application for an amniotic skin sub 03/05/17; the patient has completed 6 weeks of doxycycline. This is a deep wound on the dorsal right toe which has exposed bone proximally. She has granulation over most of this wound albeit a thin layer. I've been using Endo form. Still do not have approval for [facility] 03/13/17 on evaluation today patient's right foot wound appears to be doing better measurement wise compared to her last evaluation. She has been tolerating the dressing change without complication and does have a appointment with the dietary that has been made as far as referral is concerned there just waiting for her and transportation to contact them back for an actual date. Nonetheless patient has been having less pain at this point. No fevers, chills, nausea, or vomiting noted at this time. 03/26/17; linear wound over the dorsal aspect of her right great toe. At one point this had exposed bone on the most superior aspect however I am pleased to see today that this appears to have a surface of granulation. Apparently product was applied for but denied by [facility]. We'll need to see what that was. The patient has known PAD status post revascularization. MRI did not show osteomyelitis which was done in June 04/02/17; continued improvement using Endoform. She was approved for  Oasis but hasn't over $536 co-pay per application, this is beyond her means 04/09/17; continues on Endoform. 04/16/17; appears to be doing nicely continuing on Endoform 04/22/17; patient did not have her dressing changed all week because of the weather. Using Endoform. Base of the wound looks healthy elbow there appears to events surrounding maceration perhaps because of the drainage was not changing the wound dressing 04/30/17 wound today continues to close and except for a small divot on the superior part of the wound. This area did not probe the bone but I found this a little concerning as this was the area with exposed bone before we be able to get this to granulate forward. As I remember things she is not a candidate for skin substitutes secondary to an outlandish co-pay. I asked her husband to look into the out of pocket max for medications if he has 1 [Regranex] or totally for skin substitutes 05/07/17; the patient arrives today with a wound roughly the same size although under careful inspection under the light there appears to be some epithelialization. Her intake nurse noted that the Endoform seemed to be placed over the wound rather than in the deeper divots they could really benefit from the Endoform. At this point I have no plans to change the Endoform as at one point this was at least 33% exposed bone and the rest of the wound very close to the underlying phalanx  05/14/17; no major change in the size or appearance of the wound. We have been using Endoform for a Cefalu, [name] ([id number]) prolonged period of time with reasonably good improvement in the epithelialization i.e. no exposed bone especially proximally. Unless we've not made a lot of changes in the last several weeks. She does not complain of pain. She was revascularized early this year or perhaps late last year by Dr. Bridgett Larsson and I wonder if he will need to have a look at her, I will need to review the actual vascular procedure  which I think was a distal popliteal bypass Objective Constitutional Sitting or standing Blood Pressure is within target range for patient.. Pulse regular and within target range for patient.[name] Respirations regular, non-labored and within target range.. Temperature is normal and within the target range for the patient.[name] appears in no distress. Vitals Time Taken: 10:33 AM, Height: 64 in, Weight: 200 lbs, BMI: 34.3, Temperature: 97.8 F, Pulse: 72 bpm, Respiratory Rate: 16 breaths/min, Blood Pressure: 139/60 mmHg. Eyes Conjunctivae clear. No discharge. Respiratory Respiratory effort is easy and symmetric bilaterally. Rate is normal at rest and on room air.. Cardiovascular Pedal pulse absent in left extremity.. Lymphatic None palpable in the left popliteal or inguinal area. Psychiatric No evidence of depression, anxiety, or agitation. Calm, cooperative, and communicative. Appropriate interactions and affect.. General Notes: Wound exam; we have made good progress with the Endoform in terms of granulation and there is no longer any exposed bone in however we have stagnated in the last several weeks. There is no overt infection of this wound of the soft tissue. There is a small amount of undermining proximally although this is not changed recently either. Integumentary (Hair, Skin) Wound #7 status is Open. Original cause of wound was Gradually Appeared. The wound is located on the Right,Dorsal Metatarsal head first. The wound measures 2cm length x 0.6cm width x 0.5cm depth; 0.942cm^2 area and 0.471cm^3 volume. There is Fat Layer (Subcutaneous Tissue) Exposed exposed. Minchew, [name] ([id number]) There is no tunneling or undermining noted. There is a large amount of serous drainage noted. The wound margin is flat and intact. There is large (67-100%) red granulation within the wound bed. There is a small (1-33%) amount of necrotic tissue within the wound bed including Adherent Slough. The  periwound skin appearance exhibited: Induration, Maceration, Hemosiderin Staining. The periwound skin appearance did not exhibit: Callus, Crepitus, Excoriation, Rash, Scarring, Dry/Scaly, Atrophie Blanche, Cyanosis, Ecchymosis, Mottled, Pallor, Rubor, Erythema. 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 Cleanse wound with mild soap and water Anesthetic: Wound #7 Right,Dorsal Metatarsal head first: Topical Lidocaine 4% cream applied to wound bed prior to debridement Primary Wound Dressing: Wound #7 Right,Dorsal Metatarsal head first: Hydrafera Blue Secondary Dressing: Wound #7 Right,Dorsal Metatarsal head first: ABD pad Conform/Kerlix Dressing Change Frequency: Wound #7 Right,Dorsal Metatarsal head first: Change Dressing Monday, Wednesday, Friday Follow-up Appointments: Wound #7 Right,Dorsal Metatarsal head first: Return Appointment in 1 week.  Narez, [name] ([id number]) Edema Control: Wound #7 Right,Dorsal Metatarsal head first: Elevate legs to the level of the heart and pump ankles as often as possible Additional Orders / Instructions: Wound #7 Right,Dorsal Metatarsal head first: Increase protein intake. Activity as tolerated Home Health: Wound #7 Right,Dorsal Metatarsal head first: [facility] twice weekly, Wound Care  Center-Wednesday Home Health Nurse may visit [facility] to address patient s wound care needs. FACE TO FACE ENCOUNTER: MEDICARE and MEDICAID PATIENTS: I certify that this patient is under my care and that I had a face-to-face encounter that meets the physician face-to-face encounter requirements with this patient on this date. The encounter with the patient was in whole or in part for the following MEDICAL CONDITION: (primary reason for [place]) MEDICAL NECESSITY: I certify, that based on my findings, NURSING services are a medically necessary home health service. HOME BOUND STATUS: I certify that my clinical findings support that this patient is homebound (i.e., Due to illness or injury, pt requires aid of supportive devices such as crutches, cane, wheelchairs, walkers, the use of special transportation or the assistance of another person to leave their place of residence. There is a normal inability to leave the home and doing so requires considerable and taxing effort. Other absences are for medical reasons / religious services and are infrequent or of short duration when for other reasons). If current dressing causes regression in wound condition, may D/C ordered dressing product/s and apply Normal Saline Moist Dressing daily until next [place] / Other MD appointment. Guys of regression in wound condition at [phone]. Please direct any NON-WOUND related issues/requests for orders to patient's Primary Care Physician General Notes: AUTHORIZATION FOR [facility]ASIS 1 changed primary dressing from endoform to hydrofera bleu 2 run Oasis thru insurance 3 may need to see Dr. Bridgett Larsson who did her bypass in followup oOcclusion Electronic Signature(s) Signed: 05/14/2017 4:12:03 PM By: Linton Ham MD Entered By: Linton Ham on 05/14/2017 11:16:02 Dowdy, [name] ([id number]) -------------------------------------------------------------------------------- [facility]  Details Patient Name: Birkel, Deema V. Date of Service: 05/14/2017 Medical Record Patient Account Number: [id number] Number: Treating RN: [name] Nov 21, 1940 (76 y.o. 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 I70.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 of foot Facility Procedures CPT4 Code: 76811572 Description: 62035 - WOUND CARE VISIT-LEV 2 EST PT Modifier: Quantity: 1 Physician Procedures CPT4: Description Modifier Quantity Code [id number] - WC PHYS LEVEL 3 - EST PT 1 ICD-10 Description Diagnosis 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

## 2017-05-21 ENCOUNTER — Other Ambulatory Visit
Admission: RE | Admit: 2017-05-21 | Discharge: 2017-05-21 | Disposition: A | Discharge status: Home / Self Care | Payer: Medicare HMO | Source: Other Acute Inpatient Hospital | Attending: Internal Medicine | Admitting: Internal Medicine

## 2017-05-21 ENCOUNTER — Encounter: Payer: Medicare HMO | Admitting: Internal Medicine

## 2017-05-21 DIAGNOSIS — B999 Unspecified infectious disease: Secondary | ICD-10-CM | POA: Insufficient documentation

## 2017-05-21 DIAGNOSIS — E11622 Type 2 diabetes mellitus with other skin ulcer: Secondary | ICD-10-CM | POA: Diagnosis not present

## 2017-05-22 NOTE — Progress Notes (Signed)
SHAUNNA, ROSETTI ([id number]) Visit Report for 05/21/2017 Arrival Information Details Patient Name: [name] [name] PADRON. Date of Service: 05/21/2017 9:15 AM Medical Record Number: [id number] Patient Account Number: [id number] Date of Birth/Sex: September 01, 1940 (76 y.o. Female) Treating RN: Cornell Barman Primary Care Leon Montoya: Beverlyn Roux Other Clinician: Referring Shalissa Easterwood: Beverlyn Roux Treating Jaylaa Gallion/Extender: Tito Dine in Treatment: 28 Visit Information History Since Last Visit Any new allergies or adverse reactions: No Patient Arrived: Wheel Chair Had a fall or experienced change in No activities of daily living that may affect Arrival Time: 09:13 risk of falls: Accompanied By: husband Signs or symptoms of abuse/neglect since last No Transfer Assistance: Manual visito Patient Identification Verified: Yes Hospitalized since last visit: No Secondary Verification Process Yes Has Dressing in Place as Prescribed: Yes Completed: Pain Present Now: No Patient Requires Transmission-Based No Precautions: Patient Has Alerts: No Electronic Signature(s) Signed: 05/21/2017 5:39:47 PM By: Gretta Cool, BSN, RN, CWS, Kim RN, BSN Entered By: Gretta Cool, BSN, RN, CWS, Kim on 05/21/2017 09:23:47 [name] [name] [name] ([id number]) -------------------------------------------------------------------------------- Encounter Discharge Information Details Patient Name: [name] [name] V. Date of Service: 05/21/2017 9:15 AM Medical Record Number: [id number] Patient Account Number: [id number] Date of Birth/Sex: 23-Feb-1941 (76 y.o. Female) Treating RN: Cornell Barman Primary Care Masie Bermingham: Beverlyn Roux Other Clinician: Referring Jarrod Mcenery: Beverlyn Roux Treating Shaden Lacher/Extender: Tito Dine in Treatment: 28 Encounter Discharge Information Items Discharge Pain Level: 0 Discharge Condition: Stable Ambulatory Status: Wheelchair Discharge Destination: Home Transportation: Private Auto Accompanied By:  husband Schedule Follow-up Appointment: Yes Medication Reconciliation completed and provided to Patient/Care Yes Cambreigh Dearing: Provided on Clinical Summary of Care: 05/21/2017 Form Type Recipient Paper Patient [facility] Electronic Signature(s) Signed: 05/21/2017 5:39:47 PM By: Gretta Cool, BSN, RN, CWS, Kim RN, BSN Entered By: Gretta Cool, BSN, RN, CWS, Kim on 05/21/2017 09:57:17 [name] [name] ([id number]) -------------------------------------------------------------------------------- Lower Extremity Assessment Details Patient Name: [name] [name] V. Date of Service: 05/21/2017 9:15 AM Medical Record Number: [id number] Patient Account Number: [id number] Date of Birth/Sex: Feb 14, 1941 (76 y.o. Female) Treating RN: Cornell Barman Primary Care Remington Highbaugh: Beverlyn Roux Other Clinician: Referring Yazleen Molock: Beverlyn Roux Treating Rosabella Edgin/Extender: Tito Dine in Treatment: 28 Vascular Assessment Pulses: Dorsalis Pedis Palpable: [Right:Yes] Posterior Tibial Extremity colors, hair growth, and conditions: Extremity Color: [Right:Hyperpigmented] Hair Growth on Extremity: [Right:No] Temperature of Extremity: [Right:Warm] Capillary Refill: [Right:< 3 seconds] Dependent Rubor: [Right:No] Blanched when Elevated: [Right:No] Lipodermatosclerosis: [Right:No] Toe Nail Assessment Left: Right: Thick: No Discolored: No Deformed: No Improper Length and Hygiene: No Electronic Signature(s) Signed: 05/21/2017 5:39:47 PM By: Gretta Cool, BSN, RN, CWS, Kim RN, BSN Entered By: Gretta Cool, BSN, RN, CWS, Kim on 05/21/2017 09:29:58 [name] [name] ([id number]) -------------------------------------------------------------------------------- Multi Wound Chart Details Patient Name: [name] [name] V. Date of Service: 05/21/2017 9:15 AM Medical Record Number: [id number] Patient Account Number: [id number] Date of Birth/Sex: 1941/05/13 (76 y.o. Female) Treating RN: Cornell Barman Primary Care Beaulah Romanek: Beverlyn Roux Other  Clinician: Referring Emiyah Spraggins: Beverlyn Roux Treating Mehek Grega/Extender: Tito Dine in Treatment: 28 Vital Signs Height(in): 64 Pulse(bpm): 75 Weight(lbs): 200 Blood Pressure 149/56 (mmHg): Body Mass Index(BMI): 34 Temperature(F): 97.8 Respiratory Rate 16 (breaths/min): Photos: [N/A:N/A] Wound Location: Right Metatarsal head first N/A N/A - Dorsal Wounding Event: Gradually Appeared N/A N/A Primary Etiology: Diabetic Wound/Ulcer of N/A N/A the Lower Extremity Comorbid History: Cataracts, Chronic sinus N/A N/A problems/congestion, Congestive Heart Failure, Hypertension, Type II Diabetes Date Acquired: 08/06/2013 N/A N/A Weeks of Treatment: 28 N/A N/A Wound Status: Open N/A N/A Measurements L x W x D 2.5x0.8x0.4 N/A N/A (cm) Area (cm) : 1.571  N/A N/A Volume (cm) : 0.628 N/A N/A % Reduction in Area: -33.40% N/A N/A % Reduction in Volume: -77.90% N/A N/A Classification: Grade 2 N/A N/A Exudate Amount: Large N/A N/A Exudate Type: Serous N/A N/A Exudate Color: amber N/A N/A Wound Margin: Flat and Intact N/A N/A Hing, [name] ([id number]) Granulation Amount: Medium (34-66%) N/A N/A Granulation Quality: Red N/A N/A Necrotic Amount: Medium (34-66%) N/A N/A Exposed Structures: Fat Layer (Subcutaneous N/A N/A Tissue) Exposed: Yes Bone: Yes Fascia: No Tendon: No Muscle: No Joint: No Epithelialization: Small (1-33%) N/A N/A Debridement: Debridement ([id number]- N/A N/A 11047) Pre-procedure 09:40 N/A N/A Verification/Time Out Taken: Pain Control: Other N/A N/A Tissue Debrided: Bone N/A N/A Level: Skin/Subcutaneous N/A N/A Tissue/Muscle/Bone Debridement Area (sq 2 N/A N/A cm): Instrument: Curette, Forceps N/A N/A Specimen: Swab N/A N/A Number of Specimens 1 N/A N/A Taken: Bleeding: Minimum N/A N/A Hemostasis Achieved: Pressure N/A N/A Procedural Pain: 0 N/A N/A Post Procedural Pain: 0 N/A N/A Debridement Treatment Procedure was tolerated N/A  N/A Response: well Post Debridement 2.5x0.8x0.5 N/A N/A Measurements L x W x D (cm) Post Debridement 0.785 N/A N/A Volume: (cm) Periwound Skin Texture: Induration: Yes N/A N/A Excoriation: No Callus: No Crepitus: No Rash: No Scarring: No Periwound Skin Maceration: Yes N/A N/A Moisture: Dry/Scaly: No Periwound Skin Color: Hemosiderin Staining: Yes N/A N/A Atrophie Blanche: No Cyanosis: No Ecchymosis: No Erythema: No [name] [name] V. ([id number]) Mottled: No Pallor: No Rubor: No Tenderness on No N/A N/A Palpation: Wound Preparation: Ulcer Cleansing: N/A N/A Rinsed/Irrigated with Saline Topical Anesthetic Applied: Other: lidocaine 4% Procedures Performed: Debridement N/A N/A Treatment Notes Wound #7 (Right, Dorsal Metatarsal head first) 1. Cleansed with: Clean wound with Normal Saline 2. Anesthetic Topical Lidocaine 4% cream to wound bed prior to debridement 4. Dressing Applied: Hydrafera Blue 5. Secondary Dressing Applied Dry Gauze Notes kerlix Electronic Signature(s) Signed: 05/21/2017 5:51:56 PM By: Linton Ham MD Entered By: Linton Ham on 05/21/2017 10:06:24 Adachi, [name] [name] ([id number]) -------------------------------------------------------------------------------- Multi-Disciplinary Care Plan Details Patient Name: [name] [name] V. Date of Service: 05/21/2017 9:15 AM Medical Record Number: [id number] Patient Account Number: [id number] Date of Birth/Sex: [birth year] (76 y.o. Female) Treating RN: Cornell Barman Primary Care Isabel Ardila: Beverlyn Roux Other Clinician: Referring Krystalyn Kubota: Beverlyn Roux Treating Tanieka Pownall/Extender: Tito Dine in Treatment: 28 Active Inactive ` Abuse / Safety / Falls / Self Care Management Nursing Diagnoses: Impaired physical mobility Potential for falls Goals: Patient will remain injury free Date Initiated: 11/06/2016 Target Resolution Date: 12/30/2016 Goal Status: Active Patient/caregiver will verbalize  understanding of skin care regimen Date Initiated: 11/06/2016 Target Resolution Date: 12/30/2016 Goal Status: Active Interventions: Assess fall risk on admission and as needed Treatment Activities: Patient referred to home care : 11/06/2016 Notes: ` Nutrition Nursing Diagnoses: Potential for alteratiion in Nutrition/Potential for imbalanced nutrition Goals: Patient/caregiver verbalizes understanding of need to maintain therapeutic glucose control per primary care physician Date Initiated: 11/06/2016 Target Resolution Date: 12/30/2016 Goal Status: Active Interventions: Provide education on elevated blood sugars and impact on wound healing [name] [name] V. ([id number]) Notes: ` Orientation to the Wound Care Program Nursing Diagnoses: Knowledge deficit related to the wound healing center program Goals: Patient/caregiver will verbalize understanding of the [facility] Date Initiated: 11/06/2016 Target Resolution Date: 12/30/2016 Goal Status: Active Interventions: Provide education on orientation to the wound center Notes: ` Venous Leg Ulcer Nursing Diagnoses: Actual venous Insuffiency (use after diagnosis is confirmed) Knowledge deficit related to disease process and management Goals: Non-invasive venous studies are completed as ordered Date  Initiated: 11/06/2016 Target Resolution Date: 12/30/2016 Goal Status: Active Patient/caregiver will verbalize understanding of disease process and disease management Date Initiated: 11/06/2016 Target Resolution Date: 12/30/2016 Goal Status: Active Interventions: Assess peripheral edema status every visit. Notes: ` Wound/Skin Impairment Nursing Diagnoses: Impaired tissue integrity Knowledge deficit related to smoking impact on wound healing Knowledge deficit related to ulceration/compromised skin integrity Goals: [name] [name] V. ([id number]) Ulcer/skin breakdown will heal within 14 weeks Date Initiated: 11/06/2016 Target  Resolution Date: 02/05/2017 Goal Status: Active Interventions: Assess ulceration(s) every visit Treatment Activities: Skin care regimen initiated : 11/06/2016 Notes: Electronic Signature(s) Signed: 05/21/2017 5:39:47 PM By: Gretta Cool, BSN, RN, CWS, Kim RN, BSN Entered By: Gretta Cool, BSN, RN, CWS, Kim on 05/21/2017 09:30:34 Servantes, [name] [name] ([id number]) -------------------------------------------------------------------------------- Pain Assessment Details Patient Name: [name] [name] V. Date of Service: 05/21/2017 9:15 AM Medical Record Number: [id number] Patient Account Number: [id number] Date of Birth/Sex: 08-Dec-1940 (76 y.o. Female) Treating RN: Cornell Barman Primary Care Bekah Igoe: Beverlyn Roux Other Clinician: Referring Graylyn Bunney: Beverlyn Roux Treating Nyemah Watton/Extender: Tito Dine in Treatment: 28 Active Problems Location of Pain Severity and Description of Pain Patient Has Paino No Site Locations With Dressing Change: No Pain Management and Medication Current Pain Management: Goals for Pain Management Topical or injectable lidocaine is offered to patient for acute pain when surgical debridement is performed. If needed, Patient is instructed to use over the counter pain medication for the following 24-48 hours after debridement. Wound care MDs do not prescribed pain medications. Patient has chronic pain or uncontrolled pain. Patient has been instructed to make an appointment with their Primary Care Physician for pain management. Electronic Signature(s) Signed: 05/21/2017 5:39:47 PM By: Gretta Cool, BSN, RN, CWS, Kim RN, BSN Entered By: Gretta Cool, BSN, RN, CWS, Kim on 05/21/2017 09:23:55 Higham, [name] [name] ([id number]) -------------------------------------------------------------------------------- Patient/Caregiver Education Details Patient Name: Hagins, Addilee V. Date of Service: 05/21/2017 9:15 AM Medical Record Patient Account Number: [id number] Number: Treating RN: Cornell Barman 04-22-1941 (76 y.o. Other Clinician: Date of Birth/Gender: Female) Treating ROBSON, [name] Primary Care Physician: Beverlyn Roux Physician/Extender: G Referring Physician: Dorthula Nettles in Treatment: 92 Education Assessment Education Provided To: Patient Education Topics Provided Infection: Handouts: Infection Prevention and Management Methods: Demonstration, Explain/Verbal Responses: State content correctly Wound Debridement: Handouts: Wound Debridement Methods: Explain/Verbal Responses: State content correctly Wound/Skin Impairment: Handouts: Caring for Your Ulcer Methods: Demonstration, Explain/Verbal Responses: State content correctly Electronic Signature(s) Signed: 05/21/2017 5:39:47 PM By: Gretta Cool, BSN, RN, CWS, Kim RN, BSN Entered By: Gretta Cool, BSN, RN, CWS, Kim on 05/21/2017 09:57:55 [name] [name] [name] ([id number]) -------------------------------------------------------------------------------- Wound Assessment Details Patient Name: [name] [name] V. Date of Service: 05/21/2017 9:15 AM Medical Record Number: [id number] Patient Account Number: [id number] Date of Birth/Sex: April 22, 1941 (76 y.o. Female) Treating RN: Cornell Barman Primary Care Geraldyn Shain: Beverlyn Roux Other Clinician: Referring Shakiyla Kook: Beverlyn Roux Treating Takiah Maiden/Extender: Tito Dine in Treatment: 28 Wound Status Wound Number: 7 Primary Diabetic Wound/Ulcer of the Lower Etiology: Extremity Wound Location: Right Metatarsal head first - Dorsal Wound Open Status: Wounding Event: Gradually Appeared Comorbid Cataracts, Chronic sinus Date Acquired: 08/06/2013 History: problems/congestion, Congestive Heart Weeks Of Treatment: 28 Failure, Hypertension, Type II Diabetes Clustered Wound: No Photos Wound Measurements Length: (cm) 2.5 Width: (cm) 0.8 Depth: (cm) 0.4 Area: (cm) 1.571 Volume: (cm) 0.628 % Reduction in Area: -33.4% % Reduction in Volume: -77.9% Epithelialization: Small  (1-33%) Tunneling: No Undermining: No Wound Description Classification: Grade 2 Wound Margin: Flat and Intact Exudate Amount: Large Exudate Type: Serous Exudate Color: amber Foul Odor After Cleansing: No  Slough/Fibrino Yes Wound Bed Granulation Amount: Medium (34-66%) Exposed Structure Granulation Quality: Red Fascia Exposed: No Necrotic Amount: Medium (34-66%) Fat Layer (Subcutaneous Tissue) Exposed: Yes Necrotic Quality: Adherent Slough Tendon Exposed: No Muscle Exposed: No Joint Exposed: No Bone Exposed: Yes [name] [name] V. ([id number]) Periwound Skin Texture Texture Color No Abnormalities Noted: No No Abnormalities Noted: No Callus: No Atrophie Blanche: No Crepitus: No Cyanosis: No Excoriation: No Ecchymosis: No Induration: Yes Erythema: No Rash: No Hemosiderin Staining: Yes Scarring: No Mottled: No Pallor: No Moisture Rubor: No No Abnormalities Noted: No Dry / Scaly: No Maceration: Yes Wound Preparation Ulcer Cleansing: Rinsed/Irrigated with Saline Topical Anesthetic Applied: Other: lidocaine 4%, Treatment Notes Wound #7 (Right, Dorsal Metatarsal head first) 1. Cleansed with: Clean wound with Normal Saline 2. Anesthetic Topical Lidocaine 4% cream to wound bed prior to debridement 4. Dressing Applied: Hydrafera Blue 5. Secondary Dressing Applied Dry Gauze Notes kerlix Electronic Signature(s) Signed: 05/21/2017 5:39:47 PM By: Gretta Cool, BSN, RN, CWS, Kim RN, BSN Entered By: Gretta Cool, BSN, RN, CWS, Kim on 05/21/2017 09:42:09 Stephani, [name] [name] ([id number]) -------------------------------------------------------------------------------- [place] Details Patient Name: Fenley, [name] [name] V. Date of Service: 05/21/2017 9:15 AM Medical Record Number: [id number] Patient Account Number: [id number] Date of Birth/Sex: 01-09-1941 (76 y.o. Female) Treating RN: Cornell Barman Primary Care Phillips Goulette: Beverlyn Roux Other Clinician: Referring Amanpreet Delmont: Beverlyn Roux Treating  Aundrey Elahi/Extender: Tito Dine in Treatment: 28 Vital Signs Time Taken: 09:24 Temperature (F): 97.8 Height (in): 64 Pulse (bpm): 75 Weight (lbs): 200 Respiratory Rate (breaths/min): 16 Body Mass Index (BMI): 34.3 Blood Pressure (mmHg): 149/56 Reference Range: 80 - 120 mg / dl Electronic Signature(s) Signed: 05/21/2017 5:39:47 PM By: Gretta Cool, BSN, RN, CWS, Kim RN, BSN Entered By: Gretta Cool, BSN, RN, CWS, Kim on 05/21/2017 [phone]

## 2017-05-22 NOTE — Progress Notes (Signed)
[name] ([id number]) Visit Report for 05/21/2017 Debridement Details Patient Name: [name], [name] Date of Service: 05/21/2017 9:15 AM Medical Record Patient Account Number: [id number] Number: Treating RN: Cornell Barman 03/19/1941 (76 y.o. Other Clinician: Date of Birth/Sex: Female) Treating Kemper Heupel, [place] Primary Care Provider: Beverlyn Roux Provider/Extender: G Referring Provider: Dorthula Nettles in Treatment: 28 Debridement Performed for Wound #7 Right,Dorsal Metatarsal head first Assessment: Performed By: Physician Ricard Dillon, MD Debridement: Debridement Severity of Tissue Pre Bone involvement without necrosis Debridement: Pre-procedure Verification/Time Out Yes - 09:40 Taken: Start Time: 09:42 Pain Control: Other : lidocaine 4% Level: Skin/Subcutaneous Tissue/Muscle/Bone Total Area Debrided (L x 2.5 (cm) x 0.8 (cm) = 2 (cm) W): Tissue and other Non-Viable, Bone, Subcutaneous material debrided: Instrument: Curette, Forceps Specimen: Swab Number of Specimens 1 Taken: Bleeding: Minimum Hemostasis Achieved: Pressure End Time: 09:45 Procedural Pain: 0 Post Procedural Pain: 0 Response to Treatment: Procedure was tolerated well Post Debridement Measurements of Total Wound Length: (cm) 2.5 Width: (cm) 0.8 Depth: (cm) 0.5 Volume: (cm) 0.785 Character of Wound/Ulcer Post Requires Further Debridement Debridement: Boroff, Cookie VMarland Kitchen ([id number]) Severity of Tissue Post Debridement: Bone involvement without necrosis Post Procedure Diagnosis Same as Pre-procedure Electronic Signature(s) Signed: 05/21/2017 5:39:47 PM By: Gretta Cool, BSN, RN, CWS, Kim RN, BSN Signed: 05/21/2017 5:51:56 PM By: Linton Ham MD Entered By: Linton Ham on 05/21/2017 10:06:48 Zalewski, Brylei [name] ([id number]) -------------------------------------------------------------------------------- HPI Details Patient Name: [name], [name] Date of Service: 05/21/2017 9:15  AM Medical Record Patient Account Number: [id number] Number: Treating RN: Cornell Barman 1941/03/19 (76 y.o. Other Clinician: Date of Birth/Sex: Female) Treating Kamyah Wilhelmsen Primary Care Provider: Beverlyn Roux Provider/Extender: G Referring Provider: Dorthula Nettles in Treatment: 28 History of Present Illness HPI Description: 08/13/16: This is a 76 year old woman who came predominantly for review of 3 cm in diameter circular wound to the left anterior lateral leg. She was in the ER on 08/01/16 I reviewed their notes. There was apparently pus coming out of the wound at that time and the patient arrived requesting debridement which they don't do in the emergency room. Nevertheless I can't see that they did any x-rays. There were no cultures done. She is a type II diabetic and I a note after the patient was in the clinic that she had a bypass graft from the popliteal to the tibial on the right on 02/28/16. She also had a right greater saphenous vein harvest on the same date for arterial bypass. She is going to have vascular studies including ABIs T ABIs on the right on 08/28/16. The patient's surgery was on 02/28/16 by Dr. Vallarie Mare she had a right below the knee popliteal artery to peroneal artery bypass with reverse greater saphenous vein and an endarterectomy of the mid segment peroneal artery. Postoperatively she had a strong mild monophasic peroneal signal with a pink foot. It would appear that the patient is had some nonhealing in the surgical saphenous vein harvest site on the left leg. Surprisingly looking [facility] I cannot see much information about this at all. Dr. Lucious Groves notes from 05/29/16 show that the patient's wounds "are not healed" the right first metatarsal wound healed but then opened back up. 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 At that point the  patient had a wound VAC to her right calf with regards to the wounds on her dorsal right toe would appear that these are felt  to be arterial wounds. She has had surgery on the metatarsal phalangeal in 2015 I Dr. Doran Durand secondary to a right metatarsal phalangeal joint fracture. She is apparently had discoloration around this area since then. 08/28/16; patient arrives with her wounds in much the same condition. The linear vein harvest site and the circular wound below it which I think was a blister. She also has to probing holes in her right great toe and a necrotic eschar on the right second toe. Because of these being arterial wounds I reduced her compression from 3-2 layers this seems to of done satisfactorily she has not had any problems. I cannot see that she is actually had an x-ray ====== 11/06/16 the patient comes in for evaluation of her right lower extremity ulcers. She was here in January 2018 for 2 visits subsequently ended up in the hospital with pneumonia and then to rehabilitation. She has now been discharged from rehabilitation and is home. She has multiple ulcerations to her right lower extremity including the foot and toes. She does have home health in place and they have been placing alginate to the ulcers. She is followed by Dr. Bridgett Larsson of vascular medicine. 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 She recently saw him on 3/23. In office ABIs were: Burdin, [name] ([id number]) Right 0.48 with monophasic flow to the DP, PT, peroneal Left 0.63 with monophasic flow to the DP and PT Her arterial studies indicated a patent right below knee popliteal to peroneal bypass She had an MRI in February 2008 that was negative frosty myelitis but this showed general soft tissue edema in the right foot and lower extremity concerning for cellulitis She is a diabetic, managed with insulin. Her hemoglobin A1c in December 2017 was  8.4 which is a trend up from previous levels. She had blood work in February 2018 which revealed an albumin of 2.6 this appears to be relatively acute as an albumin in November 2017 was 3.7 11/13/16; this is a patient I have not seen since February who is readmitted to our clinic last week. 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 Bridgett Larsson. I have reviewed Dr. Lianne Moris notes from March/23/18. Doppler ABI on that date showed an ABI on the right of 0.48 and on the left of 0.63. Dorsalis pedis waveforms were monophasic bilaterally. There was no waveforms detected at the posterior tibial on the right, monophasic on the left. Dr. Lianne Moris comments were that this patient would have follow-up vascular studies in 3 months including ABIs and right lower extremity arterial duplex. She had an MRI in February that was negative for osteomyelitis but showed generalized edema in the foot. Last albumin I see was in January at 3.4 we have been using Santyl to the 4 wounds on the right leg the patient is noted today to have widespread edema well up towards her groin this is pitting 2-3+. 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 Normal ejection fraction. 11/20/16; patient has a follow-up appointment with Dr. Bridgett Larsson on April 23. She is still complaining of a lot of pain in the right foot and right leg. It is not clear to me that this is at all positional however I think it is clear claudication with minimal activity perhaps at rest. At our suggestion she is return to her  primary physician's office tomorrow with regards to her pitting lower extremity bilateral edema that I reviewed in detail last week 11/27/16; the follow-up with Dr. Bridgett Larsson was actually on May 23 on April 23 as I stated in my note last week. N/A case all of her wounds seems somewhat smaller. The 2 on the right leg are definitely  smaller. The areas on the dorsal right first toe, right third toe and the lateral part of the right fifth metatarsal head all looks smaller but have tightly adherent surfaces. We have been using Santyl 12/04/16; follow-up with Dr. Bridgett Larsson on May 23. 2 small open wounds on the right leg continue to get smaller. The area on the right third total lateral aspect of the right fifth toe also look better. The remaining area on the dorsal first toe still has some depth to it. We have been using Santyl to the toes and collagen on the right 12/11/16; according to patient's husband the follow-up with Dr. Bridgett Larsson is not until July. All of the wounds on the right leg are measuring smaller. We have been using some combination of Prisma and Santyl although I think we can go to straight Prisma today. There may have been some confusion with home health about the primary dressing orders here. 12/25/16; the patient has had some healing this week. The area on her right lateral fifth metatarsal head, right third toe are both healed lower right leg is healed. In the vein harvest site superiorly she has one superficial open area. On the dorsal aspect of her right great toe/MTP joint the wound is now divided into 2 however the proximal area is deep and there is palpable bone 01/01/17; still an open area in the middle of her original right surgical scar. The area on the right third toe and right lateral fifth metatarsal head remained closed. Problematic area on the dorsal aspect of the toe. Previous surgery in this area line 01/08/17 small open area on the original scar on her upper anterior leg although this is closing. X-ray I did of the right first toe did not show underlying bony abnormality. Still this area on the dorsal first toe probes to bone. We have been using Prisma 01/15/17; small open area in the original scar in the upper anterior leg is almost fully closed. She has 2 open areas over the dorsal aspect of the right first  toe that probes to bone. Used and a form starting last week. Vigorous bone scraping that I did last week showed few methicillin sensitive staph aureus. She is allergic to Hemric, Lavida V. ([id number]) penicillin and sulfa drugs. I'm going to give her 2 weeks of doxycycline. She will need an MRI with contrast. 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 She continues on doxycycline for a bone scraping that showed methicillin sensitive staph aureus [allergic to penicillin and sulfa]. We have been using Endo form to the wound. 01/29/17; surprisingly her MRI did not show osteomyelitis. She continues on doxycycline for a bone scraping that showed methicillin sensitive staph aureus with allergies to penicillin and sulfa. We have been using Endo form to the wound. Unfortunately I cannot get a surface on this visit looks like it is able to support a healing state. Proximally there is still exposed bone. There is no overt soft tissue tenderness. Her MRI did show a previous fracture was surgery to this area but no hardware  02/12/17 on evaluation today patient appears to be doing well in regard to her lower extremity wound. She does have some mild discomfort but this is minimal. There's no evidence of infection. Her left great toe nail has somewhat been lifted up and there is a little bit of slight bleeding underneath but this is still firmly attached. 02/19/17; she ran out of doxycycline 2 days ago. Nevertheless I would like to continue this for 2 weeks to make a full 6 weeks of therapy as the bone scraping that I did from the open area on the dorsal right first toe showed MRSA. This should complete antibiotic therapy. 02/26/17; the patient is completing her 6 weeks of oral doxycycline. Deep wound on the dorsal right toe. This still has some exposed bone proximally. She does have a reasonably granulated surface albeit thin over bone.  I've been using Endo form have made application for an amniotic skin sub 03/05/17; the patient has completed 6 weeks of doxycycline. This is a deep wound on the dorsal right toe which has exposed bone proximally. She has granulation over most of this wound albeit a thin layer. I've been using Endo form. Still do not have approval for Affinity 03/13/17 on evaluation today patient's right foot wound appears to be doing better measurement wise compared to her last evaluation. She has been tolerating the dressing change without complication and does have a appointment with the dietary that has been made as far as referral is concerned there just waiting for her and transportation to contact them back for an actual date. Nonetheless patient has been having less pain at this point. No fevers, chills, nausea, or vomiting noted at this time. 03/26/17; linear wound over the dorsal aspect of her right great toe. At one point this had exposed bone on the most superior aspect however I am pleased to see today that this appears to have a surface of granulation. Apparently product was applied for but denied by [facility]. We'll need to see what that was. The patient has known PAD status post revascularization. MRI did not show osteomyelitis which was done in June 04/02/17; continued improvement using Endoform. She was approved for Oasis but hasn't over $094 co-pay per application, this is beyond her means 04/09/17; continues on Endoform. 04/16/17; appears to be doing nicely continuing on Endoform 04/22/17; patient did not have her dressing changed all week because of the weather. Using Endoform. Base of the wound looks healthy elbow there appears to events surrounding maceration perhaps because of the drainage was not changing the wound dressing 04/30/17 wound today continues to close and except for a small divot on the superior part of the wound. This area did not probe the bone but I found this a little concerning as  this was the area with exposed bone before we be able to get this to granulate forward. As I remember things she is not a candidate for skin substitutes secondary to an outlandish co-pay. I asked her husband to look into the out of pocket max for medications if he has 1 [Regranex] or totally for skin substitutes 05/07/17; the patient arrives today with a wound roughly the same size although under careful inspection under the light there appears to be some epithelialization. Her intake nurse noted that the Endoform seemed to be placed over the wound rather than in the deeper divots they could really benefit from the Endoform. At this point I have no plans to change the Endoform as at one point this was  at least 33% exposed bone and the rest of the wound very close to the underlying phalanx 05/14/17; no major change in the size or appearance of the wound. We have been using Endoform for a Desroches, Genita V. ([id number]) prolonged period of time with reasonably good improvement in the epithelialization i.e. no exposed bone especially proximally. Unless we've not made a lot of changes in the last several weeks. She does not complain of pain. She was revascularized early this year or perhaps late last year by Dr. Bridgett Larsson and I wonder if he will need to have a look at her, I will need to review the actual vascular procedure which I think was a distal popliteal bypass 05/21/17; switched to either for a blue last week. Patient arrived in clinic with the wound not measuring any better but the surface looking some better. Unfortunately she had some surface slough and when I went to remove this superiorly she had exposed bone. Previously she had had exposed bone in the inferior part of the wound however this is granulated over some weeks ago. Clearly this is a major step back for her. With some difficulty I was able to obtain a piece of the bone probing through the superior part of the wound for culture. We did  not send pathology. It is likely that she is going to need imaging of this site but I did not order this today Electronic Signature(s) Signed: 05/21/2017 5:51:56 PM By: Linton Ham MD Entered By: Linton Ham on 05/21/2017 10:08:18 Barsky, Graceanna [name] ([id number]) -------------------------------------------------------------------------------- Physical Exam Details Patient Name: Pascucci, [name] Date of Service: 05/21/2017 9:15 AM Medical Record Patient Account Number: [id number] Number: Treating RN: Cornell Barman 1941/04/27 (76 y.o. Other Clinician: Date of Birth/Sex: Female) Treating Ami Thornsberry Primary Care Provider: Beverlyn Roux Provider/Extender: G Referring Provider: Dorthula Nettles in Treatment: 8 Constitutional Patient is hypertensive.. Pulse regular and within target range for patient.[name] Respirations regular, non-labored and within target range.. Temperature is normal and within the target range for the patient.[name] appears in no distress. Notes Wound exam; we were making good progress however the exposed bone to the superior part of the wound is clearly a step backward. With some difficulty using a #3 curet I was able to obtain a piece of this for culture but not pathology. Necrotic surface of the bone was also debrided. We will continue with Hydrofera Blue. Hemostasis with direct pressure and notable that there was a significant amount of bleeding Electronic Signature(s) Signed: 05/21/2017 5:51:56 PM By: Linton Ham MD Entered By: Linton Ham on 05/21/2017 10:09:31 Ashton, Samaiya [name] ([id number]) -------------------------------------------------------------------------------- Physician Orders Details Patient Name: Bontrager, Cyerra V. Date of Service: 05/21/2017 9:15 AM Medical Record Patient Account Number: [id number] Number: Treating RN: Cornell Barman 11-10-1940 (76 y.o. Other Clinician: Date of Birth/Sex: Female) Treating Coraleigh Sheeran Primary  Care Provider: Beverlyn Roux Provider/Extender: G Referring Provider: Dorthula Nettles in Treatment: 75 Verbal / Phone Orders: No Diagnosis Coding Wound Cleansing Wound #7 Right,Dorsal Metatarsal head first o Clean wound with Normal Saline. o Cleanse wound with mild soap and water Anesthetic Wound #7 Right,Dorsal Metatarsal head first o Topical Lidocaine 4% cream applied to wound bed prior to debridement Primary Wound Dressing Wound #7 Right,Dorsal Metatarsal head first o Hydrafera Blue Secondary Dressing Wound #7 Right,Dorsal Metatarsal head first o ABD pad o Conform/Kerlix Dressing Change Frequency Wound #7 Right,Dorsal Metatarsal head first o Change Dressing Monday, Wednesday, Friday Follow-up Appointments Wound #7 Right,Dorsal Metatarsal head first o Return Appointment  in 1 week. Edema Control Wound #7 Right,Dorsal Metatarsal head first o Elevate legs to the level of the heart and pump ankles as often as possible Additional Orders / Instructions Wound #7 Right,Dorsal Metatarsal head first o Increase protein intake. Corpening, SHAUNAE SIELOFF ([id number]) o Activity as tolerated Home Health Wound #7 Right,Dorsal Metatarsal head first o [place] [facility] twice weekly, [facility]-Wednesday o Home Health Nurse may visit PRN to address patientos wound care needs. o FACE TO FACE ENCOUNTER: MEDICARE and MEDICAID PATIENTS: I certify that this patient is under my care and that I had a face-to-face encounter that meets the physician face-to-face encounter requirements with this patient on this date. The encounter with the patient was in whole or in part for the following MEDICAL CONDITION: (primary reason for [place]) MEDICAL NECESSITY: I certify, that based on my findings, NURSING services are a medically necessary home health service. HOME BOUND STATUS: I certify that my clinical findings support that this patient is  homebound (i.e., Due to illness or injury, pt requires aid of supportive devices such as crutches, cane, wheelchairs, walkers, the use of special transportation or the assistance of another person to leave their place of residence. There is a normal inability to leave the home and doing so requires considerable and taxing effort. Other absences are for medical reasons / religious services and are infrequent or of short duration when for other reasons). o If current dressing causes regression in wound condition, may D/C ordered dressing product/s and apply Normal Saline Moist Dressing daily until next [place] / Other MD appointment. [place] of regression in wound condition at [phone]. o Please direct any NON-WOUND related issues/requests for orders to patient's Primary Care Physician Laboratory o Bacteria identified in Wound by Culture (MICRO) - Bone oooo LOINC Code: 7564-3 PIRJ Convenience Name: Wound culture routine Electronic Signature(s) Signed: 05/21/2017 5:39:47 PM By: Gretta Cool, BSN, RN, CWS, Kim RN, BSN Signed: 05/21/2017 5:51:56 PM By: Linton Ham MD Entered By: Gretta Cool, BSN, RN, CWS, Kim on 05/21/2017 09:55:53 Meda, Aryanna [name] ([id number]) -------------------------------------------------------------------------------- Problem List Details Patient Name: Bethel, Veleria V. Date of Service: 05/21/2017 9:15 AM Medical Record Patient Account Number: [id number] Number: Treating RN: Cornell Barman [birth year] (76 y.o. 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 Date of Service: 05/21/2017 9:15 AM Medical Record Patient Account Number: [id number] Number: Treating RN: Cornell Barman May 09, 1941 (76 y.o. Other Clinician: Date of Birth/Sex: Female) Treating Arshad Oberholzer Primary Care Provider: Beverlyn Roux Provider/Extender: G Referring Provider: Dorthula Nettles in Treatment: 28 Subjective History  of Present Illness (HPI) 08/13/16: This is a 76 year old woman who came predominantly for review of 3 cm in diameter circular wound to the left anterior lateral leg. She was in the ER on 08/01/16 I reviewed their notes. There was apparently pus coming out of the wound at that time and the patient arrived requesting debridement which they don't do in the emergency room. Nevertheless I can't see that they did any x-rays. There were no cultures done. She is a type II diabetic and I a note after the patient was in the clinic that she had a bypass graft from the popliteal to the tibial on the right on 02/28/16. She also had a right greater saphenous vein harvest on the same date for arterial bypass. She is going to have vascular studies including ABIs T ABIs on  the right on 08/28/16. The patient's surgery was on 02/28/16 by Dr. Vallarie Mare she had a right below the knee popliteal artery to peroneal artery bypass with reverse greater saphenous vein and an endarterectomy of the mid segment peroneal artery. Postoperatively she had a strong mild monophasic peroneal signal with a pink foot. It would appear that the patient is had some nonhealing in the surgical saphenous vein harvest site on the left leg. Surprisingly looking [facility] I cannot see much information about this at all. Dr. Lucious Groves notes from 05/29/16 show that the patient's wounds "are not healed" the right first metatarsal wound healed but then opened back up. 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 At that point the patient had a wound VAC to her right calf with regards to the wounds on her dorsal right toe would appear that these are felt to be arterial wounds. She has had surgery on the metatarsal phalangeal in 2015 I Dr. Doran Durand secondary to a right metatarsal phalangeal joint fracture. She is apparently had discoloration around this area since then. 08/28/16; patient arrives with her wounds in much the same condition. The linear vein harvest site and the circular wound below it which I think was a blister. She also has to probing holes in her right great toe and a necrotic eschar on the right second toe. Because of these being arterial wounds I reduced her compression from 3-2 layers this seems to of done satisfactorily she has not had any problems. I cannot see that she is actually had an x-ray ====== 11/06/16 the patient comes in for evaluation of her right lower extremity ulcers. She was here in January 2018 for 2 visits subsequently ended up in the hospital with pneumonia and then to rehabilitation. She has now been discharged from rehabilitation and is home. She has multiple  ulcerations to her right lower extremity including the foot and toes. She does have home health in place and they have been placing alginate to the ulcers. She is followed by Dr. Bridgett Larsson of vascular medicine. 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 She recently saw him on 3/23. In office ABIs were: Kloos, JASIA HILTUNEN. ([id number]) Right 0.48 with monophasic flow to the DP, PT, peroneal Left 0.63 with monophasic flow to the DP and PT Her arterial studies indicated a patent right below knee popliteal to peroneal bypass She had an MRI in February 2008 that was negative frosty myelitis but this showed general soft tissue edema in the right foot and  lower extremity concerning for cellulitis She is a diabetic, managed with insulin. Her hemoglobin A1c in December 2017 was 8.4 which is a trend up from previous levels. She had blood work in February 2018 which revealed an albumin of 2.6 this appears to be relatively acute as an albumin in November 2017 was 3.7 11/13/16; this is a patient I have not seen since February who is readmitted to our clinic last week. 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 Bridgett Larsson. I have reviewed Dr. Lianne Moris notes from March/23/18. Doppler ABI on that date showed an ABI on the right of 0.48 and on the left of 0.63. Dorsalis pedis waveforms were monophasic bilaterally. There was no waveforms detected at the posterior tibial on the right, monophasic on the left. Dr. Lianne Moris comments were that this patient would have follow-up vascular studies in 3 months including ABIs and right lower extremity arterial duplex. She had an MRI in February that was negative for osteomyelitis but showed generalized edema in the foot. Last albumin I see was in January at 3.4 we have been using Santyl to the 4 wounds on the right leg the patient is noted today to have  widespread edema well up towards her groin this is pitting 2-3+. 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 Normal ejection fraction. 11/20/16; patient has a follow-up appointment with Dr. Bridgett Larsson on April 23. She is still complaining of a lot of pain in the right foot and right leg. It is not clear to me that this is at all positional however I think it is clear claudication with minimal activity perhaps at rest. At our suggestion she is return to her primary physician's office tomorrow with regards to her pitting lower extremity bilateral edema that I reviewed in detail last week 11/27/16; the follow-up with Dr. Bridgett Larsson was actually on May 23 on April 23 as I stated in my note last week. N/A case all of her wounds seems somewhat smaller. The 2 on the right leg are definitely smaller. The areas on the dorsal right first toe, right third toe and the lateral part of the right fifth metatarsal head all looks smaller but have tightly adherent surfaces. We have been using Santyl 12/04/16; follow-up with Dr. Bridgett Larsson on May 23. 2 small open wounds on the right leg continue to get smaller. The area on the right third total lateral aspect of the right fifth toe also look better. The remaining area on the dorsal first toe still has some depth to it. We have been using Santyl to the toes and collagen on the right 12/11/16; according to patient's husband the follow-up with Dr. Bridgett Larsson is not until July. All of the wounds on the right leg are measuring smaller. We have been using some combination of Prisma and Santyl although I think we can go to straight Prisma today. There may have been some confusion with home health about the primary dressing orders here. 12/25/16; the patient has had some healing this week. The area on her right lateral fifth metatarsal head, right third toe are both healed lower right leg is healed. In the vein harvest site superiorly she has  one superficial open area. On the dorsal aspect of her right great toe/MTP joint the wound is now divided into 2 however the proximal area is deep and there is palpable bone 01/01/17; still an open area in  the middle of her original right surgical scar. The area on the right third toe and right lateral fifth metatarsal head remained closed. Problematic area on the dorsal aspect of the toe. Previous surgery in this area line 01/08/17 small open area on the original scar on her upper anterior leg although this is closing. X-ray I did of the right first toe did not show underlying bony abnormality. Still this area on the dorsal first toe probes to bone. We have been using Prisma 01/15/17; small open area in the original scar in the upper anterior leg is almost fully closed. She has 2 open areas over the dorsal aspect of the right first toe that probes to bone. Used and a form starting last week. Vigorous bone scraping that I did last week showed few methicillin sensitive staph aureus. She is allergic to Breck, Taniesha V. ([id number]) penicillin and sulfa drugs. I'm going to give her 2 weeks of doxycycline. She will need an MRI with contrast. 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 She continues on doxycycline for a bone scraping that showed methicillin sensitive staph aureus [allergic to penicillin and sulfa]. We have been using Endo form to the wound. 01/29/17; surprisingly her MRI did not show osteomyelitis. She continues on doxycycline for a bone scraping that showed methicillin sensitive staph aureus with allergies to penicillin and sulfa. We have been using Endo form to the wound. Unfortunately I cannot get a surface on this visit looks like it is able to support a healing state. Proximally there is still exposed bone. There is no overt soft tissue tenderness. Her MRI did show a previous fracture was surgery to this area  but no hardware 02/12/17 on evaluation today patient appears to be doing well in regard to her lower extremity wound. She does have some mild discomfort but this is minimal. There's no evidence of infection. Her left great toe nail has somewhat been lifted up and there is a little bit of slight bleeding underneath but this is still firmly attached. 02/19/17; she ran out of doxycycline 2 days ago. Nevertheless I would like to continue this for 2 weeks to make a full 6 weeks of therapy as the bone scraping that I did from the open area on the dorsal right first toe showed MRSA. This should complete antibiotic therapy. 02/26/17; the patient is completing her 6 weeks of oral doxycycline. Deep wound on the dorsal right toe. This still has some exposed bone proximally. She does have a reasonably granulated surface albeit thin over bone. I've been using Endo form have made application for an amniotic skin sub 03/05/17; the patient has completed 6 weeks of doxycycline. This is a deep wound on the dorsal right toe which has exposed bone proximally. She has granulation over most of this wound albeit a thin layer. I've been using Endo form. Still do not have approval for Affinity 03/13/17 on evaluation today patient's right foot wound appears to be doing better measurement wise compared to her last evaluation. She has been tolerating the dressing change without complication and does have a appointment with the dietary that has been made as far as referral is concerned there just waiting for her and transportation to contact them back for an actual date. Nonetheless patient has been having less pain at this point. No fevers, chills, nausea, or vomiting noted at this time. 03/26/17; linear wound over the dorsal  aspect of her right great toe. At one point this had exposed bone on the most superior aspect however I am pleased to see today that this appears to have a surface of granulation. Apparently product was  applied for but denied by [facility]. We'll need to see what that was. The patient has known PAD status post revascularization. MRI did not show osteomyelitis which was done in June 04/02/17; continued improvement using Endoform. She was approved for Oasis but hasn't over $182 co-pay per application, this is beyond her means 04/09/17; continues on Endoform. 04/16/17; appears to be doing nicely continuing on Endoform 04/22/17; patient did not have her dressing changed all week because of the weather. Using Endoform. Base of the wound looks healthy elbow there appears to events surrounding maceration perhaps because of the drainage was not changing the wound dressing 04/30/17 wound today continues to close and except for a small divot on the superior part of the wound. This area did not probe the bone but I found this a little concerning as this was the area with exposed bone before we be able to get this to granulate forward. As I remember things she is not a candidate for skin substitutes secondary to an outlandish co-pay. I asked her husband to look into the out of pocket max for medications if he has 1 [Regranex] or totally for skin substitutes 05/07/17; the patient arrives today with a wound roughly the same size although under careful inspection under the light there appears to be some epithelialization. Her intake nurse noted that the Endoform seemed to be placed over the wound rather than in the deeper divots they could really benefit from the Endoform. At this point I have no plans to change the Endoform as at one point this was at least 33% exposed bone and the rest of the wound very close to the underlying phalanx 05/14/17; no major change in the size or appearance of the wound. We have been using Endoform for a [name], [name] ([id number]) prolonged period of time with reasonably good improvement in the epithelialization i.e. no exposed bone especially proximally. Unless we've not made a lot of  changes in the last several weeks. She does not complain of pain. She was revascularized early this year or perhaps late last year by Dr. Bridgett Larsson and I wonder if he will need to have a look at her, I will need to review the actual vascular procedure which I think was a distal popliteal bypass 05/21/17; switched to either for a blue last week. Patient arrived in clinic with the wound not measuring any better but the surface looking some better. Unfortunately she had some surface slough and when I went to remove this superiorly she had exposed bone. Previously she had had exposed bone in the inferior part of the wound however this is granulated over some weeks ago. Clearly this is a major step back for her. With some difficulty I was able to obtain a piece of the bone probing through the superior part of the wound for culture. We did not send pathology. It is likely that she is going to need imaging of this site but I did not order this today Objective Constitutional Patient is hypertensive.. Pulse regular and within target range for patient.[name] Respirations regular, non-labored and within target range.. Temperature is normal and within the target range for the patient.[name] appears in no distress. Vitals Time Taken: 9:24 AM, Height: 64 in, Weight: 200 lbs, BMI: 34.3, Temperature: 97.8 F, Pulse:  75 bpm, Respiratory Rate: 16 breaths/min, Blood Pressure: 149/56 mmHg. General Notes: Wound exam; we were making good progress however the exposed bone to the superior part of the wound is clearly a step backward. With some difficulty using a #3 curet I was able to obtain a piece of this for culture but not pathology. Necrotic surface of the bone was also debrided. We will continue with Hydrofera Blue. Hemostasis with direct pressure and notable that there was a significant amount of bleeding Integumentary (Hair, Skin) Wound #7 status is Open. Original cause of wound was Gradually Appeared. The wound is  located on the Right,Dorsal Metatarsal head first. The wound measures 2.5cm length x 0.8cm width x 0.4cm depth; 1.571cm^2 area and 0.628cm^3 volume. There is bone and Fat Layer (Subcutaneous Tissue) Exposed exposed. There is no tunneling or undermining noted. There is a large amount of serous drainage noted. The wound margin is flat and intact. There is medium (34-66%) red granulation within the wound bed. There is a medium (34-66%) amount of necrotic tissue within the wound bed including Adherent Slough. The periwound skin appearance exhibited: Induration, Maceration, Hemosiderin Staining. The periwound skin appearance did not exhibit: Callus, Crepitus, Excoriation, Rash, Scarring, Dry/Scaly, Atrophie Blanche, Cyanosis, Ecchymosis, Mottled, Pallor, Rubor, Erythema. 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 There was a Skin/Subcutaneous Tissue/Muscle/Bone Debridement ([id number]) debridement with total area of 2 sq cm performed by Ricard Dillon, MD. with the following instrument(s): Curette and Forceps to remove Non-Viable tissue/material including Bone and Subcutaneous after achieving pain control using Other  (lidocaine 4%). 1 Specimen was taken by a Swab and sent to the lab per facility protocol.A time out was conducted at 09:40, prior to the start of the procedure. A Minimum amount of bleeding was controlled with Pressure. The procedure was tolerated well with a pain level of 0 throughout and a pain level of 0 following the procedure. Post Debridement Measurements: 2.5cm length x 0.8cm width x 0.5cm depth; 0.785cm^3 volume. Character of Wound/Ulcer Post Debridement requires further debridement. Severity of Tissue Post Debridement is: Bone involvement without necrosis. Post procedure Diagnosis Wound #7: Same as Pre-Procedure Plan Wound Cleansing: Wound #7 Right,Dorsal Metatarsal head first: Clean wound with Normal Saline. Cleanse wound with mild soap and water Anesthetic: Turri, Jessee V. ([id number]) Wound #7 Right,Dorsal Metatarsal head first: Topical Lidocaine 4% cream applied to wound bed prior to debridement Primary Wound Dressing: Wound #7 Right,Dorsal Metatarsal head first: Hydrafera Blue Secondary Dressing: Wound #7 Right,Dorsal Metatarsal head first: ABD pad Conform/Kerlix Dressing Change Frequency: Wound #7 Right,Dorsal Metatarsal head first: Change Dressing Monday, Wednesday, Friday Follow-up Appointments: Wound #7 Right,Dorsal Metatarsal head first: Return Appointment in 1 week. Edema Control: Wound #7 Right,Dorsal Metatarsal head first: Elevate legs to the level of the heart and pump ankles as often as possible Additional Orders /  Instructions: Wound #7 Right,Dorsal Metatarsal head first: Increase protein intake. Activity as tolerated Home Health: Wound #7 Right,Dorsal Metatarsal head first: [place] [facility] weekly, [facility] Home Health Nurse may visit PRN to address patient s wound care needs. FACE TO FACE ENCOUNTER: MEDICARE and MEDICAID PATIENTS: I certify that this patient is under my care and that I had a  face-to-face encounter that meets the physician face-to-face encounter requirements with this patient on this date. The encounter with the patient was in whole or in part for the following MEDICAL CONDITION: (primary reason for [place]) MEDICAL NECESSITY: I certify, that based on my findings, NURSING services are a medically necessary home health service. HOME BOUND STATUS: I certify that my clinical findings support that this patient is homebound (i.e., Due to illness or injury, pt requires aid of supportive devices such as crutches, cane, wheelchairs, walkers, the use of special transportation or the assistance of another person to leave their place of residence. There is a normal inability to leave the home and doing so requires considerable and taxing effort. Other absences are for medical reasons / religious services and are infrequent or of short duration when for other reasons). If current dressing causes regression in wound condition, may D/C ordered dressing product/s and apply Normal Saline Moist Dressing daily until next [place] / Other MD appointment. [place] of regression in wound condition at [phone]. Please direct any NON-WOUND related issues/requests for orders to patient's Primary Care Physician Laboratory ordered were: Wound culture routine - Bone Weissmann, Selia V. ([id number]) #1 there is exposed bone here and I have obtained a specimen for culture #2 See if I can get an additional specimen for pathology next week #3 we had some discussion about the use of a advanced tissue product such as Oasis however given the exposed bone and the possibility of osteomyelitis this will have to be put on hold #4 continue Hydrofera Blue #5 likely she is going to need the toe imaged possible MRI although I need to check a think she has hardware Electronic Signature(s) Signed: 05/21/2017 5:51:56 PM By: Linton Ham MD Entered By: Linton Ham  on 05/21/2017 10:11:45 Grenier, Raphaela [name] ([id number]) -------------------------------------------------------------------------------- SuperBill Details Patient Name: [name], [name] Date of Service: 05/21/2017 Medical Record Patient Account Number: [id number] Number: Treating RN: Cornell Barman 03-04-1941 (76 y.o. 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 I70.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 of foot Facility Procedures CPT4: Description Modifier Quantity Code [id number] 11044 - DEB BONE 20 SQ CM/< 1 ICD-10 Description Diagnosis 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

## 2017-05-23 ENCOUNTER — Ambulatory Visit: Payer: Medicare HMO | Admitting: Vascular Surgery

## 2017-05-23 ENCOUNTER — Encounter (HOSPITAL_COMMUNITY): Payer: Medicare HMO

## 2017-05-26 LAB — AEROBIC CULTURE W GRAM STAIN (SUPERFICIAL SPECIMEN)

## 2017-05-26 LAB — AEROBIC CULTURE  (SUPERFICIAL SPECIMEN)

## 2017-05-28 ENCOUNTER — Encounter: Payer: Medicare HMO | Admitting: Internal Medicine

## 2017-05-28 DIAGNOSIS — E11622 Type 2 diabetes mellitus with other skin ulcer: Secondary | ICD-10-CM | POA: Diagnosis not present

## 2017-05-30 NOTE — Progress Notes (Signed)
AUNIKA, KIRSTEN ([id number]) Visit Report for 05/28/2017 Arrival Information Details Patient Name: [name], [name] Date of Service: 05/28/2017 9:15 AM Medical Record Number: [id number] Patient Account Number: [id number] Date of Birth/Sex: 13-Apr-1941 (76 y.o. Female) Treating RN: [name] Primary Care [name] Other Clinician: Referring [name]: [name] Treating [name]/Extender: [name] in Treatment: 65 Visit Information History Since Last Visit Added or deleted any medications: No Patient Arrived: Wheel Chair Any new allergies or adverse reactions: No Arrival Time: 09:17 Had a fall or experienced change in No activities of daily living that may affect Accompanied By: husband risk of falls: Transfer Assistance: None Hospitalized since last visit: No Patient Identification Verified: Yes Has Dressing in Place as Prescribed: Yes Secondary Verification Process Completed: Yes Has Footwear/Offloading in Place as Yes Patient Requires Transmission-Based No Prescribed: Precautions: Right: Surgical Shoe with Pressure Patient Has Alerts: No Relief Insole Pain Present Now: Yes Electronic Signature(s) Signed: 05/28/2017 5:14:29 PM By: [name], BSN, RN, CWS, Kim RN, BSN Entered By: [name], BSN, RN, CWS, [name] on 05/28/2017 09:28:37 [name], [name] ([id number]) -------------------------------------------------------------------------------- Clinic Level of Care Assessment Details Patient Name: [name], [name] Date of Service: 05/28/2017 9:15 AM Medical Record Number: [id number] Patient Account Number: [id number] Date of Birth/Sex: 1941-04-04 (76 y.o. Female) Treating RN: [name] Primary Care Aianna Fahs: [name] Other Clinician: Referring Serjio Deupree: [name] Treating Yardley Beltran/Extender: [name] in Treatment: 29 Clinic Level of Care Assessment Items TOOL 3 Quantity Score []  - Use when EandM and Procedure is performed on FOLLOW-UP  visit 0 ASSESSMENTS - Nursing Assessment / Reassessment []  - Reassessment of Co-morbidities (includes updates in patient status) 0 X- 1 5 Reassessment of Adherence to Treatment Plan ASSESSMENTS - Wound and Skin Assessment / Reassessment []  - Points for Wound Assessment can only be taken for a new wound of unknown or different 0 etiology and a procedure is NOT performed to that wound []  - 0 Simple Wound Assessment / Reassessment - one wound []  - 0 Complex Wound Assessment / Reassessment - multiple wounds []  - 0 Dermatologic / Skin Assessment (not related to wound area) ASSESSMENTS - Focused Assessment []  - Circumferential Edema Measurements - multi extremities 0 []  - 0 Nutritional Assessment / Counseling / Intervention []  - 0 Lower Extremity Assessment (monofilament, tuning fork, pulses) []  - 0 Peripheral Arterial Disease Assessment (using hand held doppler) ASSESSMENTS - Ostomy and/or Continence Assessment and Care []  - Incontinence Assessment and Management 0 []  - 0 Ostomy Care Assessment and Management (repouching, etc.) PROCESS - Coordination of Care []  - Points for Discharge Coordination can only be taken for a new wound of unknown or different 0 etiology and a procedure is NOT performed to that wound X- 1 15 Simple Patient / Family Education for ongoing care []  - 0 Complex (extensive) Patient / Family Education for ongoing care []  - 0 Staff obtains Programmer, systems, Records, Test Results / Process Orders []  - 0 Staff telephones HHA, Nursing Homes / Clarify orders / etc []  - 0 Routine Transfer to another Facility (non-emergent condition) []  - 0 Routine Hospital Admission (non-emergent condition) [name], [name] ([id number]) []  - 0 New Admissions / Biomedical engineer / Ordering NPWT, Apligraf, etc. []  - 0 Emergency Hospital Admission (emergent condition) X- 1 10 Simple Discharge Coordination []  - 0 Complex (extensive) Discharge Coordination PROCESS - Special  Needs []  - Pediatric / Minor Patient Management 0 []  - 0 Isolation Patient Management []  - 0 Hearing / Language / Visual special needs []  -  0 Assessment of Community assistance (transportation, D/C planning, etc.) []  - 0 Additional assistance / Altered mentation []  - 0 Support Surface(s) Assessment (bed, cushion, seat, etc.) INTERVENTIONS - Wound Cleansing / Measurement []  - Points for Wound Cleaning / Measurement, Wound Dressing, Specimen Collection and 0 Specimen taken to lab can only be taken for a new wound of unknown or different etiology and a procedure is NOT performed to that wound []  - 0 Simple Wound Cleansing - one wound []  - 0 Complex Wound Cleansing - multiple wounds []  - 0 Wound Imaging (photographs - any number of wounds) []  - 0 Wound Tracing (instead of photographs) []  - 0 Simple Wound Measurement - one wound []  - 0 Complex Wound Measurement - multiple wounds INTERVENTIONS - Wound Dressings []  - Small Wound Dressing one or multiple wounds 0 X- 1 15 Medium Wound Dressing one or multiple wounds []  - 0 Large Wound Dressing one or multiple wounds INTERVENTIONS - Miscellaneous []  - External ear exam 0 []  - 0 Specimen Collection (cultures, biopsies, blood, body fluids, etc.) []  - 0 Specimen(s) / Culture(s) sent or taken to Lab for analysis []  - 0 Patient Transfer (multiple staff / Civil Service fast streamer / Similar devices) []  - 0 Simple Staple / Suture removal (25 or less) []  - 0 Complex Staple / Suture removal (26 or more) [name], [name] ([id number]) []  - 0 Hypo / Hyperglycemic Management (close monitor of Blood Glucose) []  - 0 Ankle / Brachial Index (ABI) - do not check if billed separately X- 1 5 Vital Signs Has the patient been seen at the hospital within the last three years: Yes Total Score: 50 Level Of Care: New/Established - Level 2 Electronic Signature(s) Signed: 05/28/2017 5:14:29 PM By: [name], BSN, RN, CWS, Kim RN, BSN Entered By: [name], BSN, RN, CWS,  [name] on 05/28/2017 09:50:53 [name], [name] ([id number]) -------------------------------------------------------------------------------- Encounter Discharge Information Details Patient Name: [name], [name] Date of Service: 05/28/2017 9:15 AM Medical Record Number: [id number] Patient Account Number: [id number] Date of Birth/Sex: Mar 29, 1941 (76 y.o. Female) Treating RN: [name] Primary Care Alanny Rivers: [name] Other Clinician: Referring Abbie Jablon: [name] Treating Kaeo [name]/Extender: [name] in Treatment: 36 Encounter Discharge Information Items Discharge Pain Level: 0 Discharge Condition: Stable Ambulatory Status: Wheelchair Discharge Destination: Home Private Transportation: Auto Accompanied By: husband Schedule Follow-up Appointment: Yes Medication Reconciliation completed and provided Yes to Patient/Care Aadam Zhen: Clinical Summary of Care: Electronic Signature(s) Signed: 05/28/2017 5:14:29 PM By: [name], BSN, RN, CWS, Kim RN, BSN Entered By: [name], BSN, RN, CWS, [name] on 05/28/2017 09:53:01 Hockenberry, [name] ([id number]) -------------------------------------------------------------------------------- Lower Extremity Assessment Details Patient Name: [name], [name] Date of Service: 05/28/2017 9:15 AM Medical Record Number: [id number] Patient Account Number: [id number] Date of Birth/Sex: 1941-07-05 (76 y.o. Female) Treating RN: [name] Primary Care Juana Montini: [name] Other Clinician: Referring Bronsen Serano: [name] Treating Domenick Quebedeaux/Extender: [name] in Treatment: 29 Vascular Assessment Pulses: Dorsalis Pedis Palpable: [Right:No] Doppler Audible: [Right:Inaudible] Posterior Tibial Palpable: [Right:No] Doppler Audible: [Right:Inaudible] Extremity colors, hair growth, and conditions: Extremity Color: [Right:Dusky] Hair Growth on Extremity: [Right:Yes] Temperature of Extremity: [Right:Warm] Capillary Refill: [Right:> 3  seconds] Toe Nail Assessment Left: Right: Thick: No Discolored: No Deformed: Yes Improper Length and Hygiene: No Electronic Signature(s) Signed: 05/28/2017 5:14:29 PM By: [name], BSN, RN, CWS, Kim RN, BSN Entered By: [name], BSN, RN, CWS, [name] on 05/28/2017 09:37:27 Bienkowski, Tryphena [name] ([id number]) -------------------------------------------------------------------------------- Multi Wound Chart Details Patient Name: [name], [name] Date of Service: 05/28/2017 9:15 AM Medical Record  Number: [id number] Patient Account Number: [id number] Date of Birth/Sex: 02-05-1941 (75 y.o. Female) Treating RN: [name] Primary Care Farris Blash: [name] Other Clinician: Referring Kyrian Stage: [name] Treating Brinleigh Tew/Extender: [name] in Treatment: 29 Vital Signs Height(in): 64 Pulse(bpm): 26 Weight(lbs): 200 Blood Pressure(mmHg): 151/53 Body Mass Index(BMI): 34 Temperature(F): 97.8 Respiratory Rate 16 (breaths/min): Photos: [N/A:N/A] Wound Location: Right Metatarsal head first - N/A N/A Dorsal Wounding Event: Gradually Appeared N/A N/A Primary Etiology: Diabetic Wound/Ulcer of the N/A N/A Lower Extremity Comorbid History: Cataracts, Chronic sinus N/A N/A problems/congestion, Congestive Heart Failure, Hypertension, Type II Diabetes Date Acquired: 08/06/2013 N/A N/A Weeks of Treatment: 29 N/A N/A Wound Status: Open N/A N/A Measurements L x W x D 2.5x0.8x0.5 N/A N/A (cm) Area (cm) : 1.571 N/A N/A Volume (cm) : 0.785 N/A N/A % Reduction in Area: -33.40% N/A N/A % Reduction in Volume: -122.40% N/A N/A Classification: Grade 2 N/A N/A Exudate Amount: Large N/A N/A Exudate Type: Serous N/A N/A Exudate Color: amber N/A N/A Wound Margin: Flat and Intact N/A N/A Granulation Amount: Medium (34-66%) N/A N/A Granulation Quality: Red N/A N/A Necrotic Amount: Medium (34-66%) N/A N/A Exposed Structures: Fat Layer (Subcutaneous N/A N/A Tissue) Exposed: Yes Bone:  Yes Fascia: No Tendon: No Ake, Loralie V. ([id number]) Muscle: No Joint: No Epithelialization: Small (1-33%) N/A N/A Debridement: Debridement ([id number]) N/A N/A Pre-procedure 09:44 N/A N/A Verification/Time Out Taken: Pain Control: Other N/A N/A Tissue Debrided: Bone, Fibrin/Slough N/A N/A Level: Skin/Subcutaneous Tissue N/A N/A Debridement Area (sq cm): 2 N/A N/A Instrument: Curette N/A N/A Bleeding: Minimum N/A N/A Hemostasis Achieved: Pressure N/A N/A Procedural Pain: 3 N/A N/A Post Procedural Pain: 3 N/A N/A Debridement Treatment Procedure was tolerated well N/A N/A Response: Post Debridement 2.5x0.8x0.5 N/A N/A Measurements L x W x D (cm) Post Debridement Volume: 0.785 N/A N/A (cm) Periwound Skin Texture: Induration: Yes N/A N/A Excoriation: No Callus: No Crepitus: No Rash: No Scarring: No Periwound Skin Moisture: Maceration: No N/A N/A Dry/Scaly: No Periwound Skin Color: Hemosiderin Staining: Yes N/A N/A Atrophie Blanche: No Cyanosis: No Ecchymosis: No Erythema: No Mottled: No Pallor: No Rubor: No Tenderness on Palpation: Yes N/A N/A Wound Preparation: Ulcer Cleansing: N/A N/A Rinsed/Irrigated with Saline Topical Anesthetic Applied: Other: lidocaine 4% Procedures Performed: Debridement N/A N/A Treatment Notes Wound #7 (Right, Dorsal Metatarsal head first) 1. Cleansed with: Clean wound with Normal Saline 2. Anesthetic Topical Lidocaine 4% cream to wound bed prior to debridement 4. Dressing Applied: Hydrafera Blue 5. Secondary Dressing Applied ABD and Kerlix/Conform Thurston, [place] ([id number]) 7. Secured with Recruitment consultant) Signed: 05/28/2017 5:28:55 PM By: Linton Ham MD Previous Signature: 05/28/2017 9:42:33 AM Version By: [name], BSN, RN, CWS, Kim RN, BSN Entered By: Linton Ham on 05/28/2017 10:17:30 Lokey, [name]  ([id number]) -------------------------------------------------------------------------------- Multi-Disciplinary Care Plan Details Patient Name: [name], [name] Date of Service: 05/28/2017 9:15 AM Medical Record Number: [id number] Patient Account Number: [id number] Date of Birth/Sex: 1941-07-10 (76 y.o. Female) Treating RN: [name] Primary Care Elston Aldape: [name] Other Clinician: Referring Lucca Ballo: [name] Treating Earline Stiner/Extender: [name] in Treatment: 59 Active Inactive ` Abuse / Safety / Falls / Self Care Management Nursing Diagnoses: Impaired physical mobility Potential for falls Goals: Patient will remain injury free Date Initiated: 11/06/2016 Target Resolution Date: 12/30/2016 Goal Status: Active Patient/caregiver will verbalize understanding of skin care regimen Date Initiated: 11/06/2016 Target Resolution Date: 12/30/2016 Goal Status: Active Interventions: Assess fall risk on admission and as needed Treatment Activities: Patient referred to home care :  11/06/2016 Notes: ` Nutrition Nursing Diagnoses: Potential for alteratiion in Nutrition/Potential for imbalanced nutrition Goals: Patient/caregiver verbalizes understanding of need to maintain therapeutic glucose control per primary care physician Date Initiated: 11/06/2016 Target Resolution Date: 12/30/2016 Goal Status: Active Interventions: Provide education on elevated blood sugars and impact on wound healing Notes: ` Orientation to the Wound Care Program Nursing Diagnoses: Knowledge deficit related to the wound healing center program Chretien, LONNETTE SHRODE ([id number]) Goals: Patient/caregiver will verbalize understanding of the [facility] Date Initiated: 11/06/2016 Target Resolution Date: 12/30/2016 Goal Status: Active Interventions: Provide education on orientation to the wound center Notes: ` Venous Leg Ulcer Nursing Diagnoses: Actual venous Insuffiency (use after  diagnosis is confirmed) Knowledge deficit related to disease process and management Goals: Non-invasive venous studies are completed as ordered Date Initiated: 11/06/2016 Target Resolution Date: 12/30/2016 Goal Status: Active Patient/caregiver will verbalize understanding of disease process and disease management Date Initiated: 11/06/2016 Target Resolution Date: 12/30/2016 Goal Status: Active Interventions: Assess peripheral edema status every visit. Notes: ` Wound/Skin Impairment Nursing Diagnoses: Impaired tissue integrity Knowledge deficit related to smoking impact on wound healing Knowledge deficit related to ulceration/compromised skin integrity Goals: Ulcer/skin breakdown will heal within 14 weeks Date Initiated: 11/06/2016 Target Resolution Date: 02/05/2017 Goal Status: Active Interventions: Assess ulceration(s) every visit Treatment Activities: Skin care regimen initiated : 11/06/2016 Notes: Electronic Signature(s) Signed: 05/28/2017 9:42:19 AM By: [name], BSN, RN, CWS, Kim RN, BSN [name], [name] ([id number]) Entered By: [name], BSN, RN, CWS, [name] on 05/28/2017 09:42:18 Woodberry, Catheleen [name] ([id number]) -------------------------------------------------------------------------------- Pain Assessment Details Patient Name: [name], [name] Date of Service: 05/28/2017 9:15 AM Medical Record Number: [id number] Patient Account Number: [id number] Date of Birth/Sex: 16-Nov-1940 (76 y.o. Female) Treating RN: [name] Primary Care Kirra Verga: [name] Other Clinician: Referring Lorraine Terriquez: [name] Treating Kahner Yanik/Extender: [name] in Treatment: 29 Active Problems Location of Pain Severity and Description of Pain Patient Has Paino Yes Site Locations Rate the pain. Current Pain Level: 4 Character of Pain Describe the Pain: Dull, Other: numbing Pain Management and Medication Current Pain Management: Goals for Pain Management Topical or injectable lidocaine is  offered to patient for acute pain when surgical debridement is performed. If needed, Patient is instructed to use over the counter pain medication for the following 24-48 hours after debridement. Wound care MDs do not prescribed pain medications. Patient has chronic pain or uncontrolled pain. Patient has been instructed to make an appointment with their Primary Care Physician for pain management. Electronic Signature(s) Signed: 05/28/2017 5:14:29 PM By: [name], BSN, RN, CWS, Kim RN, BSN Entered By: [name], BSN, RN, CWS, [name] on 05/28/2017 09:30:03 Selk, [name] ([id number]) -------------------------------------------------------------------------------- Patient/Caregiver Education Details Patient Name: Edgington, Tzippy V. Date of Service: 05/28/2017 9:15 AM Medical Record Number: [id number] Patient Account Number: [id number] Date of Birth/Gender: 1940-12-28 (76 y.o. Female) Treating RN: [name] Primary Care Physician: [name] Other Clinician: Referring Physician: Beverlyn White Treating Physician/Extender: [name] in Treatment: 35 Education Assessment Education Provided To: Patient and Caregiver Education Topics Provided Wound/Skin Impairment: Handouts: Caring for Your Ulcer, Other: wound care as prescribed Methods: Demonstration, Explain/Verbal Responses: State content correctly Electronic Signature(s) Signed: 05/28/2017 5:14:29 PM By: [name], BSN, RN, CWS, Kim RN, BSN Entered By: [name], BSN, RN, CWS, [name] on 05/28/2017 09:53:28 [name], [name] ([id number]) -------------------------------------------------------------------------------- Wound Assessment Details Patient Name: [name], [name] Date of Service: 05/28/2017 9:15 AM Medical Record Number: [id number] Patient Account Number: [id number] Date of Birth/Sex: 03-17-1941 (76 y.o. Female) Treating RN: [name]  Primary Care Pami Wool: [name] Other Clinician: Referring Demaryius Imran: [name] Treating  Delcenia Inman/Extender: [name] in Treatment: 29 Wound Status Wound Number: 7 Primary Diabetic Wound/Ulcer of the Lower Extremity Etiology: Wound Location: Right Metatarsal head first - Dorsal Wound Open Wounding Event: Gradually Appeared Status: Date Acquired: 08/06/2013 Comorbid Cataracts, Chronic sinus problems/congestion, Weeks Of Treatment: 29 History: Congestive Heart Failure, Hypertension, Type II Clustered Wound: No Diabetes Photos Wound Measurements Length: (cm) 2.5 Width: (cm) 0.8 Depth: (cm) 0.5 Area: (cm) 1.571 Volume: (cm) 0.785 % Reduction in Area: -33.4% % Reduction in Volume: -122.4% Epithelialization: Small (1-33%) Tunneling: No Undermining: No Wound Description Classification: Grade 2 Wound Margin: Flat and Intact Exudate Amount: Large Exudate Type: Serous Exudate Color: amber Foul Odor After Cleansing: No Slough/Fibrino Yes Wound Bed Granulation Amount: Medium (34-66%) Exposed Structure Granulation Quality: Red Fascia Exposed: No Necrotic Amount: Medium (34-66%) Fat Layer (Subcutaneous Tissue) Exposed: Yes Necrotic Quality: Adherent Slough Tendon Exposed: No Muscle Exposed: No Joint Exposed: No Bone Exposed: Yes Periwound Skin Texture Texture Color No Abnormalities Noted: No No Abnormalities Noted: No Callus: No Atrophie Blanche: No [name], [name] ([id number]) Crepitus: No Cyanosis: No Excoriation: No Ecchymosis: No Induration: Yes Erythema: No Rash: No Hemosiderin Staining: Yes Scarring: No Mottled: No Pallor: No Moisture Rubor: No No Abnormalities Noted: No Dry / Scaly: No Temperature / Pain Maceration: No Tenderness on Palpation: Yes Wound Preparation Ulcer Cleansing: Rinsed/Irrigated with Saline Topical Anesthetic Applied: Other: lidocaine 4%, Treatment Notes Wound #7 (Right, Dorsal Metatarsal head first) 1. Cleansed with: Clean wound with Normal Saline 2. Anesthetic Topical Lidocaine 4% cream to wound  bed prior to debridement 4. Dressing Applied: Hydrafera Blue 5. Secondary Dressing Applied ABD and Kerlix/Conform 7. Secured with Recruitment consultant) Signed: 05/28/2017 5:14:29 PM By: [name], BSN, RN, CWS, Kim RN, BSN Entered By: [name], BSN, RN, CWS, [name] on 05/28/2017 09:34:53 Kujawa, [name] ([id number]) -------------------------------------------------------------------------------- Vitals Details Patient Name: Rappa, Teyona V. Date of Service: 05/28/2017 9:15 AM Medical Record Number: [id number] Patient Account Number: [id number] Date of Birth/Sex: [birth year] (76 y.o. Female) Treating RN: [name] Primary Care Gregrey Bloyd: [name] Other Clinician: Referring Tanasia Budzinski: [name] Treating Juda Lajeunesse/Extender: [name] in Treatment: 29 Vital Signs Time Taken: 09:24 Temperature (F): 97.8 Height (in): 64 Pulse (bpm): 77 Weight (lbs): 200 Respiratory Rate (breaths/min): 16 Body Mass Index (BMI): 34.3 Blood Pressure (mmHg): 151/53 Reference Range: 80 - 120 mg / dl Electronic Signature(s) Signed: 05/28/2017 5:14:29 PM By: [name], BSN, RN, CWS, Kim RN, BSN Entered By: [name], BSN, RN, CWS, [name] on 05/28/2017 09:31:50

## 2017-05-30 NOTE — Progress Notes (Addendum)
LOVEDA, COLAIZZI ([id number]) Visit Report for 05/28/2017 Debridement Details Patient Name: [name], [name] Date of Service: 05/28/2017 9:15 AM Medical Record Number: [id number] Patient Account Number: [id number] Date of Birth/Sex: 05/30/1941 (76 y.o. Female) Treating RN: Cornell Barman Primary Care Provider: Beverlyn Roux Other Clinician: Referring Provider: Beverlyn Roux Treating Provider/Extender: Tito Dine in Treatment: 29 Debridement Performed for Wound #7 Right,Dorsal Metatarsal head first Assessment: Performed By: Physician Ricard Dillon, MD Debridement: Debridement Severity of Tissue Pre Necrosis of bone Debridement: Pre-procedure Verification/Time Yes - 09:44 Out Taken: Start Time: 09:44 Pain Control: Other : lidocaine 4% Level: Skin/Subcutaneous Tissue Total Area Debrided (L x W): 2.5 (cm) x 0.8 (cm) = 2 (cm) Tissue and other material Viable, Non-Viable, Bone, Fibrin/Slough debrided: Instrument: Curette Bleeding: Minimum Hemostasis Achieved: Pressure End Time: 09:47 Procedural Pain: 3 Post Procedural Pain: 3 Response to Treatment: Procedure was tolerated well Post Debridement Measurements of Total Wound Length: (cm) 2.5 Width: (cm) 0.8 Depth: (cm) 0.5 Volume: (cm) 0.785 Character of Wound/Ulcer Post Debridement: Requires Further Debridement Severity of Tissue Post Debridement: Fat layer exposed Post Procedure Diagnosis Same as Pre-procedure Electronic Signature(s) Signed: 05/28/2017 5:14:29 PM By: Gretta Cool, BSN, RN, CWS, Kim RN, BSN Signed: 05/28/2017 5:28:55 PM By: Linton Ham MD Entered By: Linton Ham on 05/28/2017 10:17:48 [name], [name] ([id number]) -------------------------------------------------------------------------------- HPI Details Patient Name: [name], [name] Date of Service: 05/28/2017 9:15 AM Medical Record Number: [id number] Patient Account Number: [id number] Date of Birth/Sex: [birth year] (75 y.o. Female) Treating RN:  Cornell Barman Primary Care Provider: Beverlyn Roux Other Clinician: Referring Provider: Beverlyn Roux Treating Provider/Extender: Tito Dine in Treatment: 29 History of Present Illness HPI Description: 08/13/16: This is a 76 year old woman who came predominantly for review of 3 cm in diameter circular wound to the left anterior lateral leg. She was in the ER on 08/01/16 I reviewed their notes. There was apparently pus coming out of the wound at that time and the patient arrived requesting debridement which they don't do in the emergency room. Nevertheless I can't see that they did any x-rays. There were no cultures done. She is a type II diabetic and I a note after the patient was in the clinic that she had a bypass graft from the popliteal to the tibial on the right on 02/28/16. She also had a right greater saphenous vein harvest on the same date for arterial bypass. She is going to have vascular studies including ABIs T ABIs on the right on 08/28/16. The patient's surgery was on 02/28/16 by Dr. Vallarie Mare she had a right below the knee popliteal artery to peroneal artery bypass with reverse greater saphenous vein and an endarterectomy of the mid segment peroneal artery. Postoperatively she had a strong mild monophasic peroneal signal with a pink foot. It would appear that the patient is had some nonhealing in the surgical saphenous vein harvest site on the left leg. Surprisingly looking [facility] I cannot see much information about this at all. Dr. Lucious Groves notes from 05/29/16 show that the patient's wounds "are not healed" the right first metatarsal wound healed but then opened back up. 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 At that point the patient had a wound VAC to her right calf with regards to the wounds on her dorsal right toe would appear that these are felt to be  arterial wounds. She has had surgery on the metatarsal phalangeal in  2015 I Dr. Doran Durand secondary to a right metatarsal phalangeal joint fracture. She is apparently had discoloration around this area since then. 08/28/16; patient arrives with her wounds in much the same condition. The linear vein harvest site and the circular wound below it which I think was a blister. She also has to probing holes in her right great toe and a necrotic eschar on the right second toe. Because of these being arterial wounds I reduced her compression from 3-2 layers this seems to of done satisfactorily she has not had any problems. I cannot see that she is actually had an x-ray ====== 11/06/16 the patient comes in for evaluation of her right lower extremity ulcers. She was here in January 2018 for 2 visits subsequently ended up in the hospital with pneumonia and then to rehabilitation. She has now been discharged from rehabilitation and is home. She has multiple ulcerations to her right lower extremity including the foot and toes. She does have home health in place and they have been placing alginate to the ulcers. She is followed by Dr. Bridgett Larsson of vascular medicine. 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 She recently saw him on 3/23. In office ABIs were: Right 0.48 with monophasic flow to the DP, PT, peroneal Left 0.63 with monophasic flow to the DP and PT Her arterial studies indicated a patent right below knee popliteal to peroneal bypass She had an MRI in February 2008 that was negative frosty myelitis but this showed general soft tissue edema in the right foot and lower extremity concerning for cellulitis She is a diabetic, managed with insulin. Her hemoglobin A1c in December 2017 was 8.4 which is a trend up from previous levels. She had blood work in February 2018 which revealed an albumin of 2.6 this appears to be relatively acute as  an albumin in November 2017 was 3.7 11/13/16; this is a patient I have not seen since February who is readmitted to our clinic last week. 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 Bridgett Larsson. I have reviewed Dr. Lianne Moris notes from March/23/18. Doppler ABI on that date showed an ABI on the right of 0.48 and on the left of 0.63. Dorsalis pedis waveforms were monophasic bilaterally. There was no waveforms detected at the posterior tibial on the right, monophasic on the left. Dr. Lianne Moris comments were that this patient would have follow-up vascular studies in 3 months including ABIs and right lower extremity arterial duplex. She had an MRI in February that was negative for osteomyelitis but showed generalized edema in the foot. Last albumin I see was in January at 3.4 we have been using Santyl to the 4 wounds on the right leg [name], [name] ([id number]) the patient is noted today to have widespread edema well up towards her groin this is pitting 2-3+. 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 Normal ejection fraction. 11/20/16; patient has a follow-up appointment with Dr. Bridgett Larsson on April 23. She is still complaining of a lot of pain in the right foot and right leg. It is not clear to me that this is at all positional however I think it is clear claudication with minimal activity perhaps at rest. At our suggestion she is return to her primary physician's office tomorrow with regards to her pitting lower extremity bilateral edema  that I reviewed in detail last week 11/27/16; the follow-up with Dr. Bridgett Larsson was actually on May 23 on April 23 as I stated in my note last week. N/A case all of her wounds seems somewhat smaller. The 2 on the right leg are definitely smaller. The areas on the dorsal right first toe, right third toe and the lateral part of the right fifth metatarsal head all looks  smaller but have tightly adherent surfaces. We have been using Santyl 12/04/16; follow-up with Dr. Bridgett Larsson on May 23. 2 small open wounds on the right leg continue to get smaller. The area on the right third total lateral aspect of the right fifth toe also look better. The remaining area on the dorsal first toe still has some depth to it. We have been using Santyl to the toes and collagen on the right 12/11/16; according to patient's husband the follow-up with Dr. Bridgett Larsson is not until July. All of the wounds on the right leg are measuring smaller. We have been using some combination of Prisma and Santyl although I think we can go to straight Prisma today. There may have been some confusion with home health about the primary dressing orders here. 12/25/16; the patient has had some healing this week. The area on her right lateral fifth metatarsal head, right third toe are both healed lower right leg is healed. In the vein harvest site superiorly she has one superficial open area. On the dorsal aspect of her right great toe/MTP joint the wound is now divided into 2 however the proximal area is deep and there is palpable bone 01/01/17; still an open area in the middle of her original right surgical scar. The area on the right third toe and right lateral fifth metatarsal head remained closed. Problematic area on the dorsal aspect of the toe. Previous surgery in this area line 01/08/17 small open area on the original scar on her upper anterior leg although this is closing. X-ray I did of the right first toe did not show underlying bony abnormality. Still this area on the dorsal first toe probes to bone. We have been using Prisma 01/15/17; small open area in the original scar in the upper anterior leg is almost fully closed. She has 2 open areas over the dorsal aspect of the right first toe that probes to bone. Used and a form starting last week. Vigorous bone scraping that I did last week showed few methicillin  sensitive staph aureus. She is allergic to penicillin and sulfa drugs. I'm going to give her 2 weeks of doxycycline. She will need an MRI with contrast. 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 She continues on doxycycline for a bone scraping that showed methicillin sensitive staph aureus [allergic to penicillin and sulfa]. We have been using Endo form to the wound. 01/29/17; surprisingly her MRI did not show osteomyelitis. She continues on doxycycline for a bone scraping that showed methicillin sensitive staph aureus with allergies to penicillin and sulfa. We have been using Endo form to the wound. Unfortunately I cannot get a surface on this visit looks like it is able to support a healing state. Proximally there is still exposed bone. There is no overt soft tissue tenderness. Her MRI did show a previous fracture was surgery to this area but no hardware 02/12/17 on evaluation today patient appears to be doing well in regard to her lower extremity wound.  She does have some mild discomfort but this is minimal. There's no evidence of infection. Her left great toe nail has somewhat been lifted up and there is a little bit of slight bleeding underneath but this is still firmly attached. 02/19/17; she ran out of doxycycline 2 days ago. Nevertheless I would like to continue this for 2 weeks to make a full 6 weeks of therapy as the bone scraping that I did from the open area on the dorsal right first toe showed MRSA. This should complete antibiotic therapy. 02/26/17; the patient is completing her 6 weeks of oral doxycycline. Deep wound on the dorsal right toe. This still has some exposed bone proximally. She does have a reasonably granulated surface albeit thin over bone. I've been using Endo form have made application for an amniotic skin sub 03/05/17; the patient has completed 6 weeks of doxycycline. This is a deep wound on the  dorsal right toe which has exposed bone proximally. She has granulation over most of this wound albeit a thin layer. I've been using Endo form. Still do not have approval for Affinity 03/13/17 on evaluation today patient's right foot wound appears to be doing better measurement wise compared to her last evaluation. She has been tolerating the dressing change without complication and does have a appointment with the dietary that has been made as far as referral is concerned there just waiting for her and transportation to contact them back for an actual date. Nonetheless patient has been having less pain at this point. No fevers, chills, nausea, or vomiting noted at this time. 03/26/17; linear wound over the dorsal aspect of her right great toe. At one point this had exposed bone on the most superior aspect however I am pleased to see today that this appears to have a surface of granulation. Apparently product was applied for but denied by [facility]. We'll need to see what that was. The patient has known PAD status post revascularization. MRI did not show osteomyelitis which was done in June 04/02/17; continued improvement using Endoform. She was approved for [employer]asis but hasn't over $371 co-pay per application, this is beyond her means [name], [name] ([id number]) 04/09/17; continues on Endoform. 04/16/17; appears to be doing nicely continuing on Endoform 04/22/17; patient did not have her dressing changed all week because of the weather. Using Endoform. Base of the wound looks healthy elbow there appears to events surrounding maceration perhaps because of the drainage was not changing the wound dressing 04/30/17 wound today continues to close and except for a small divot on the superior part of the wound. This area did not probe the bone but I found this a little concerning as this was the area with exposed bone before we be able to get this to granulate forward. As I remember things she is not a candidate  for skin substitutes secondary to an outlandish co-pay. I asked her husband to look into the out of pocket max for medications if he has 1 [Regranex] or totally for skin substitutes 05/07/17; the patient arrives today with a wound roughly the same size although under careful inspection under the light there appears to be some epithelialization. Her intake nurse noted that the Endoform seemed to be placed over the wound rather than in the deeper divots they could really benefit from the Endoform. At this point I have no plans to change the Endoform as at one point this was at least 33% exposed bone and the rest of the wound very close  to the underlying phalanx 05/14/17; no major change in the size or appearance of the wound. We have been using Endoform for a prolonged period of time with reasonably good improvement in the epithelialization i.e. no exposed bone especially proximally. Unless we've not made a lot of changes in the last several weeks. She does not complain of pain. She was revascularized early this year or perhaps late last year by Dr. Bridgett Larsson and I wonder if he will need to have a look at her, I will need to review the actual vascular procedure which I think was a distal popliteal bypass 05/21/17; switched to either for a blue last week. Patient arrived in clinic with the wound not measuring any better but the surface looking some better. Unfortunately she had some surface slough and when I went to remove this superiorly she had exposed bone. Previously she had had exposed bone in the inferior part of the wound however this is granulated over some weeks ago. Clearly this is a major step back for her. With some difficulty I was able to obtain a piece of the bone probing through the superior part of the wound for culture. We did not send pathology. It is likely that she is going to need imaging of this site but I did not order this today 05/28/17; bone culture I took last week showed MSSA.  She still has exposed bone however I could not get another piece for pathology. She had an MRI 4 months ago that did not show osteoarthritic at time. Wound rapidly deteriorated superiorly and I suspect this is underlying osteomyelitis. We have been using Hydrofera Blue Electronic Signature(s) Signed: 05/28/2017 5:28:55 PM By: Linton Ham MD Entered By: Linton Ham on 05/28/2017 10:18:45 [name], [name] ([id number]) -------------------------------------------------------------------------------- Physical Exam Details Patient Name: [name], [name] Date of Service: 05/28/2017 9:15 AM Medical Record Number: [id number] Patient Account Number: [id number] Date of Birth/Sex: May 11, 1941 (76 y.o. Female) Treating RN: Cornell Barman Primary Care Provider: Beverlyn Roux Other Clinician: Referring Provider: Beverlyn Roux Treating Provider/Extender: Tito Dine in Treatment: 44 Constitutional Patient is hypertensive.. Pulse regular and within target range for patient.[name] Respirations regular, non-labored and within target range.. Temperature is normal and within the target range for the patient.[name] appears in no distress. Cardiovascular Pedal pulses absent bilaterally.. Notes Wound exam; no major change in the wound from last week. Superiorly there is still exposed bone in the surface of the wound. Using a #3 curet I debrided the surface of this but I could not get a piece of this for pathology. There is no overt surrounding soft tissue infection no tenderness no erythema Electronic Signature(s) Signed: 05/28/2017 5:28:55 PM By: Linton Ham MD Entered By: Linton Ham on 05/28/2017 10:19:50 Stigger, Murline [name] ([id number]) -------------------------------------------------------------------------------- Physician Orders Details Patient Name: Kienitz, Gracyn V. Date of Service: 05/28/2017 9:15 AM Medical Record Number: [id number] Patient Account Number: [id number] Date of Birth/Sex: 1941/05/01  (76 y.o. Female) Treating RN: Cornell Barman Primary Care Provider: Beverlyn Roux Other Clinician: Referring Provider: Beverlyn Roux Treating Provider/Extender: Tito Dine in Treatment: 88 Verbal / Phone Orders: No Diagnosis Coding Wound Cleansing Wound #7 Right,Dorsal Metatarsal head first o Clean wound with Normal Saline. o Cleanse wound with mild soap and water Anesthetic Wound #7 Right,Dorsal Metatarsal head first o Topical Lidocaine 4% cream applied to wound bed prior to debridement Primary Wound Dressing Wound #7 Right,Dorsal Metatarsal head first o Hydrafera Blue Secondary Dressing Wound #7 Right,Dorsal Metatarsal head first o ABD pad o Conform/Kerlix  Dressing Change Frequency Wound #7 Right,Dorsal Metatarsal head first o Change Dressing Monday, Wednesday, Friday Follow-up Appointments Wound #7 Right,Dorsal Metatarsal head first o Return Appointment in 1 week. Edema Control Wound #7 Right,Dorsal Metatarsal head first o Elevate legs to the level of the heart and pump ankles as often as possible Additional Orders / Instructions Wound #7 Right,Dorsal Metatarsal head first o Increase protein intake. o Activity as tolerated Home Health Wound #7 Right,Dorsal Metatarsal head first o Continue Home Health Visits - [facility] weekly, [facility]-Wednesday o Home Health Nurse may visit PRN to address patientos wound care needs. o FACE TO FACE ENCOUNTER: MEDICARE and MEDICAID PATIENTS: I certify that this patient is under my care and that I had a face-to-face encounter that meets the physician face-to-face encounter requirements with this [name], [name] ([id number]) patient on this date. The encounter with the patient was in whole or in part for the following MEDICAL CONDITION: (primary reason for [place]) MEDICAL NECESSITY: I certify, that based on my findings, NURSING services are a medically necessary home health service.  HOME BOUND STATUS: I certify that my clinical findings support that this patient is homebound (i.e., Due to illness or injury, pt requires aid of supportive devices such as crutches, cane, wheelchairs, walkers, the use of special transportation or the assistance of another person to leave their place of residence. There is a normal inability to leave the home and doing so requires considerable and taxing effort. Other absences are for medical reasons / religious services and are infrequent or of short duration when for other reasons). o If current dressing causes regression in wound condition, may D/C ordered dressing product/s and apply Normal Saline Moist Dressing daily until next [place] / Other MD appointment. [place] of regression in wound condition at [phone]. o Please direct any NON-WOUND related issues/requests for orders to patient's Primary Care Physician Medications-please add to medication list. Wound #7 Right,Dorsal Metatarsal head first o P.O. Antibiotics Consults o Infectious Disease Electronic Signature(s) Signed: 05/28/2017 5:14:29 PM By: Gretta Cool, BSN, RN, CWS, Kim RN, BSN Signed: 05/28/2017 5:28:55 PM By: Linton Ham MD Entered By: Gretta Cool, BSN, RN, CWS, Kim on 05/28/2017 09:49:19 Perkinson, [name] ([id number]) -------------------------------------------------------------------------------- Problem List Details Patient Name: Haun, Marchetta V. Date of Service: 05/28/2017 9:15 AM Medical Record Number: [id number] Patient Account Number: [id number] Date of Birth/Sex: 1940/08/07 (76 y.o. 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 ([id number]) -------------------------------------------------------------------------------- Progress Note Details Patient Name: Hudnall, Adilenne V. Date of Service: 05/28/2017 9:15 AM Medical Record Number: [id number] Patient Account Number: [id number] Date of Birth/Sex: 1941-05-18 (  76 y.o. Female) Treating RN: Cornell Barman Primary Care Provider: Beverlyn Roux Other Clinician: Referring Provider: Beverlyn Roux Treating Provider/Extender: Tito Dine in Treatment: 29 Subjective History of Present Illness (HPI) 08/13/16: This is a 76 year old woman who came predominantly for review of 3 cm in diameter circular wound to the left anterior lateral leg. She was in the ER on 08/01/16 I reviewed their notes. There was apparently pus coming out of the wound at that time and the patient arrived requesting debridement which they don't do in the emergency room. Nevertheless I can't see that they did any x-rays. There were no cultures done. She is a type II diabetic and I a note after the patient was in the clinic that she had a bypass graft from the popliteal to the tibial on the right on 02/28/16. She also had a right  greater saphenous vein harvest on the same date for arterial bypass. She is going to have vascular studies including ABIs T ABIs on the right on 08/28/16. The patient's surgery was on 02/28/16 by Dr. Vallarie Mare she had a right below the knee popliteal artery to peroneal artery bypass with reverse greater saphenous vein and an endarterectomy of the mid segment peroneal artery. Postoperatively she had a strong mild monophasic peroneal signal with a pink foot. It would appear that the patient is had some nonhealing in the surgical saphenous vein harvest site on the left leg. Surprisingly looking [facility] I cannot see much information about this at all. Dr. Lucious Groves notes from 05/29/16 show that the patient's wounds "are not healed" the right first metatarsal wound healed but then opened back up. 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 At that point the patient had a wound VAC to her right calf with regards to the wounds on her dorsal right toe would appear that these are felt to be arterial wounds. She has had surgery on the metatarsal phalangeal in 2015 I Dr. Doran Durand secondary to a right metatarsal phalangeal joint fracture. She is apparently had discoloration around this area since then. 08/28/16; patient arrives with her wounds in much the same condition. The linear vein harvest site and the circular wound below it which I think was a blister. She also has to probing holes in her right great toe and a necrotic eschar on the right second toe. Because of these being arterial wounds I reduced her compression from 3-2 layers this seems to of done satisfactorily she has not had any problems. I cannot see that she is actually had an x-ray ====== 11/06/16 the patient comes in for evaluation of her right lower extremity ulcers. She was here in January 2018 for 2 visits subsequently ended up in the  hospital with pneumonia and then to rehabilitation. She has now been discharged from rehabilitation and is home. She has multiple ulcerations to her right lower extremity including the foot and toes. She does have home health in place and they have been placing alginate to the ulcers. She is followed by Dr. Bridgett Larsson of vascular medicine. 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 She recently saw him on 3/23. In office ABIs were: Right 0.48 with monophasic flow to the DP, PT, peroneal Left 0.63 with monophasic flow to the DP and PT Her arterial studies indicated a patent right below knee popliteal to peroneal  bypass She had an MRI in February 2008 that was negative frosty myelitis but this showed general soft tissue edema in the right foot and lower extremity concerning for cellulitis She is a diabetic, managed with insulin. Her hemoglobin A1c in December 2017 was 8.4 which is a trend up from previous levels. She had blood work in February 2018 which revealed an albumin of 2.6 this appears to be relatively acute as an albumin in November 2017 was 3.7 11/13/16; this is a patient I have not seen since February who is readmitted to our clinic last week. 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 Bridgett Larsson. I have reviewed Dr. Lianne Moris notes from March/23/18. Doppler ABI on that date showed an ABI on the right of 0.48 and on the left of 0.63. Dorsalis pedis waveforms were monophasic bilaterally. There was no waveforms detected at the posterior tibial on the right, monophasic on the left. Dr. Lianne Moris comments were that this patient would have follow-up vascular studies in 3 months including ABIs and right lower extremity arterial duplex. She had an MRI in February that was negative for osteomyelitis but showed generalized edema in Jarchow, Tyiana V. ([id number]) the foot. Last  albumin I see was in January at 3.4 we have been using Santyl to the 4 wounds on the right leg the patient is noted today to have widespread edema well up towards her groin this is pitting 2-3+. 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 Normal ejection fraction. 11/20/16; patient has a follow-up appointment with Dr. Bridgett Larsson on April 23. She is still complaining of a lot of pain in the right foot and right leg. It is not clear to me that this is at all positional however I think it is clear claudication with minimal activity perhaps at rest. At our suggestion she is return to her primary physician's office tomorrow with regards to her pitting lower extremity bilateral edema that I reviewed in detail last week 11/27/16; the follow-up with Dr. Bridgett Larsson was actually on May 23 on April 23 as I stated in my note last week. N/A case all of her wounds seems somewhat smaller. The 2 on the right leg are definitely smaller. The areas on the dorsal right first toe, right third toe and the lateral part of the right fifth metatarsal head all looks smaller but have tightly adherent surfaces. We have been using Santyl 12/04/16; follow-up with Dr. Bridgett Larsson on May 23. 2 small open wounds on the right leg continue to get smaller. The area on the right third total lateral aspect of the right fifth toe also look better. The remaining area on the dorsal first toe still has some depth to it. We have been using Santyl to the toes and collagen on the right 12/11/16; according to patient's husband the follow-up with Dr. Bridgett Larsson is not until July. All of the wounds on the right leg are measuring smaller. We have been using some combination of Prisma and Santyl although I think we can go to straight Prisma today. There may have been some confusion with home health about the primary dressing orders here. 12/25/16; the patient has had some healing this week. The area on her right lateral  fifth metatarsal head, right third toe are both healed lower right leg is healed. In the vein harvest site superiorly she has one superficial open area. On the dorsal aspect of  her right great toe/MTP joint the wound is now divided into 2 however the proximal area is deep and there is palpable bone 01/01/17; still an open area in the middle of her original right surgical scar. The area on the right third toe and right lateral fifth metatarsal head remained closed. Problematic area on the dorsal aspect of the toe. Previous surgery in this area line 01/08/17 small open area on the original scar on her upper anterior leg although this is closing. X-ray I did of the right first toe did not show underlying bony abnormality. Still this area on the dorsal first toe probes to bone. We have been using Prisma 01/15/17; small open area in the original scar in the upper anterior leg is almost fully closed. She has 2 open areas over the dorsal aspect of the right first toe that probes to bone. Used and a form starting last week. Vigorous bone scraping that I did last week showed few methicillin sensitive staph aureus. She is allergic to penicillin and sulfa drugs. I'm going to give her 2 weeks of doxycycline. She will need an MRI with contrast. 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 She continues on doxycycline for a bone scraping that showed methicillin sensitive staph aureus [allergic to penicillin and sulfa]. We have been using Endo form to the wound. 01/29/17; surprisingly her MRI did not show osteomyelitis. She continues on doxycycline for a bone scraping that showed methicillin sensitive staph aureus with allergies to penicillin and sulfa. We have been using Endo form to the wound. Unfortunately I cannot get a surface on this visit looks like it is able to support a healing state. Proximally there is still exposed bone. There is no  overt soft tissue tenderness. Her MRI did show a previous fracture was surgery to this area but no hardware 02/12/17 on evaluation today patient appears to be doing well in regard to her lower extremity wound. She does have some mild discomfort but this is minimal. There's no evidence of infection. Her left great toe nail has somewhat been lifted up and there is a little bit of slight bleeding underneath but this is still firmly attached. 02/19/17; she ran out of doxycycline 2 days ago. Nevertheless I would like to continue this for 2 weeks to make a full 6 weeks of therapy as the bone scraping that I did from the open area on the dorsal right first toe showed MRSA. This should complete antibiotic therapy. 02/26/17; the patient is completing her 6 weeks of oral doxycycline. Deep wound on the dorsal right toe. This still has some exposed bone proximally. She does have a reasonably granulated surface albeit thin over bone. I've been using Endo form have made application for an amniotic skin sub 03/05/17; the patient has completed 6 weeks of doxycycline. This is a deep wound on the dorsal right toe which has exposed bone proximally. She has granulation over most of this wound albeit a thin layer. I've been using Endo form. Still do not have approval for Affinity 03/13/17 on evaluation today patient's right foot wound appears to be doing better measurement wise compared to her last evaluation. She has been tolerating the dressing change without complication and does have a appointment with the dietary that has been made as far as referral is concerned there just waiting for her and transportation to contact them back for an actual date. Nonetheless  patient has been having less pain at this point. No fevers, chills, nausea, or vomiting noted at this time. 03/26/17; linear wound over the dorsal aspect of her right great toe. At one point this had exposed bone on the most superior aspect however I am pleased to  see today that this appears to have a surface of granulation. Apparently product was applied for but denied by [facility]. We'll need to see what that was. The patient has known PAD status post revascularization. MRI did not show osteomyelitis which was done in June 04/02/17; continued improvement using Endoform. She was approved for [employer]asis but hasn't over $269 co-pay per application, [name], [name] ([id number]) this is beyond her means 04/09/17; continues on Endoform. 04/16/17; appears to be doing nicely continuing on Endoform 04/22/17; patient did not have her dressing changed all week because of the weather. Using Endoform. Base of the wound looks healthy elbow there appears to events surrounding maceration perhaps because of the drainage was not changing the wound dressing 04/30/17 wound today continues to close and except for a small divot on the superior part of the wound. This area did not probe the bone but I found this a little concerning as this was the area with exposed bone before we be able to get this to granulate forward. As I remember things she is not a candidate for skin substitutes secondary to an outlandish co-pay. I asked her husband to look into the out of pocket max for medications if he has 1 [Regranex] or totally for skin substitutes 05/07/17; the patient arrives today with a wound roughly the same size although under careful inspection under the light there appears to be some epithelialization. Her intake nurse noted that the Endoform seemed to be placed over the wound rather than in the deeper divots they could really benefit from the Endoform. At this point I have no plans to change the Endoform as at one point this was at least 33% exposed bone and the rest of the wound very close to the underlying phalanx 05/14/17; no major change in the size or appearance of the wound. We have been using Endoform for a prolonged period of time with reasonably good improvement in the  epithelialization i.e. no exposed bone especially proximally. Unless we've not made a lot of changes in the last several weeks. She does not complain of pain. She was revascularized early this year or perhaps late last year by Dr. Bridgett Larsson and I wonder if he will need to have a look at her, I will need to review the actual vascular procedure which I think was a distal popliteal bypass 05/21/17; switched to either for a blue last week. Patient arrived in clinic with the wound not measuring any better but the surface looking some better. Unfortunately she had some surface slough and when I went to remove this superiorly she had exposed bone. Previously she had had exposed bone in the inferior part of the wound however this is granulated over some weeks ago. Clearly this is a major step back for her. With some difficulty I was able to obtain a piece of the bone probing through the superior part of the wound for culture. We did not send pathology. It is likely that she is going to need imaging of this site but I did not order this today 05/28/17; bone culture I took last week showed MSSA. She still has exposed bone however I could not get another piece for pathology. She had an MRI  4 months ago that did not show osteoarthritic at time. Wound rapidly deteriorated superiorly and I suspect this is underlying osteomyelitis. We have been using Hydrofera Blue Objective Constitutional Patient is hypertensive.. Pulse regular and within target range for patient.[name] Respirations regular, non-labored and within target range.. Temperature is normal and within the target range for the patient.[name] appears in no distress. Vitals Time Taken: 9:24 AM, Height: 64 in, Weight: 200 lbs, BMI: 34.3, Temperature: 97.8 F, Pulse: 77 bpm, Respiratory Rate: 16 breaths/min, Blood Pressure: 151/53 mmHg. Cardiovascular Pedal pulses absent bilaterally.. General Notes: Wound exam; no major change in the wound from last week. Superiorly  there is still exposed bone in the surface of the wound. Using a #3 curet I debrided the surface of this but I could not get a piece of this for pathology. There is no overt surrounding soft tissue infection no tenderness no erythema Integumentary (Hair, Skin) Wound #7 status is Open. Original cause of wound was Gradually Appeared. The wound is located on the Right,Dorsal Metatarsal head first. The wound measures 2.5cm length x 0.8cm width x 0.5cm depth; 1.571cm^2 area and 0.785cm^3 volume. There is bone and Fat Layer (Subcutaneous Tissue) Exposed exposed. There is no tunneling or undermining noted. [name], [name] ([id number]) There is a large amount of serous drainage noted. The wound margin is flat and intact. There is medium (34-66%) red granulation within the wound bed. There is a medium (34-66%) amount of necrotic tissue within the wound bed including Adherent Slough. The periwound skin appearance exhibited: Induration, Hemosiderin Staining. The periwound skin appearance did not exhibit: Callus, Crepitus, Excoriation, Rash, Scarring, Dry/Scaly, Maceration, Atrophie Blanche, Cyanosis, Ecchymosis, Mottled, Pallor, Rubor, Erythema. The periwound has tenderness on palpation. 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 There was a Skin/Subcutaneous Tissue Debridement ([id number]) debridement with total area of 2 sq cm performed by Ricard Dillon, MD. with the following instrument(s): Curette to remove Viable and Non-Viable tissue/material including Bone and Fibrin/Slough after achieving pain control using Other (lidocaine 4%). A time out was conducted at 09:44, prior to the start of the procedure. A Minimum amount of bleeding was controlled with Pressure. The procedure was tolerated well with a pain level of 3 throughout and a pain level of 3 following the procedure. Post Debridement Measurements: 2.5cm length x 0.8cm width x 0.5cm depth; 0.785cm^3 volume. Character of Wound/Ulcer Post Debridement requires further debridement. Severity of Tissue Post Debridement is: Fat layer exposed. Post procedure Diagnosis Wound #7: Same as Pre-Procedure Plan Wound Cleansing: Wound #7 Right,Dorsal Metatarsal head first: Clean wound with Normal Saline. Cleanse wound with mild soap and water Anesthetic: Wound #7 Right,Dorsal Metatarsal head first: Topical Lidocaine 4% cream applied to wound bed prior to debridement Primary Wound Dressing: [name], [name] ([id number]) Wound #7 Right,Dorsal Metatarsal  head first: Hydrafera Blue Secondary Dressing: Wound #7 Right,Dorsal Metatarsal head first: ABD pad Conform/Kerlix Dressing Change Frequency: Wound #7 Right,Dorsal Metatarsal head first: Change Dressing Monday, Wednesday, Friday Follow-up Appointments: Wound #7 Right,Dorsal Metatarsal head first: Return Appointment in 1 week. Edema Control: Wound #7 Right,Dorsal Metatarsal head first: Elevate legs to the level of the heart and pump ankles as often as possible Additional Orders / Instructions: Wound #7 Right,Dorsal Metatarsal head first: Increase protein intake. Activity as tolerated Home  Health: Wound #7 Right,Dorsal Metatarsal head first: [place] [facility] weekly, [facility] Home Health Nurse may visit PRN to address patient s wound care needs. FACE TO FACE ENCOUNTER: MEDICARE and MEDICAID PATIENTS: I certify that this patient is under my care and that I had a face-to-face encounter that meets the physician face-to-face encounter requirements with this patient on this date. The encounter with the patient was in whole or in part for the following MEDICAL CONDITION: (primary reason for [place]) MEDICAL NECESSITY: I certify, that based on my findings, NURSING services are a medically necessary home health service. HOME BOUND STATUS: I certify that my clinical findings support that this patient is homebound (i.e., Due to illness or injury, pt requires aid of supportive devices such as crutches, cane, wheelchairs, walkers, the use of special transportation or the assistance of another person to leave their place of residence. There is a normal inability to leave the home and doing so requires considerable and taxing effort. Other absences are for medical reasons / religious services and are infrequent or of short duration when for other reasons). If current dressing causes regression in wound condition, may D/C ordered dressing product/s and apply Normal Saline Moist Dressing daily until next [place] / Other MD appointment. Arthur of regression in wound condition at [phone]. Please direct any NON-WOUND related issues/requests for orders to patient's Primary Care Physician Medications-please add to medication list.: Wound #7 Right,Dorsal Metatarsal head first: P.O. Antibiotics Consults ordered were: Infectious Disease #1 I'm going to continue with the [facility] for now #2 consult to infectious disease Dr. Ola Spurr question IV antibiotics and oral antibiotics for MSSA bone  infection #3 I did talk about surgical approaches to this which would undoubtedly involve a first ray amputation #4 patient also has PAD they apparently missed their follow-up appointment with Dr. Bridgett Larsson last week which is unfortunate. This may complicate any attempts to heal this as well. We'll see if we can rebook with Dr. Bridgett Larsson. Their current appointment is in February #5 I'm not certain whether the patient could tolerate hyperbarics however for now I'm not going to pursue this until they see Dr. Nadara Mustard, [name] MORITZ ([id number]) Electronic Signature(s) Signed: 05/28/2017 5:28:55 PM By: Linton Ham MD Entered By: Linton Ham on 05/28/2017 10:22:09 Pizano, [place] [name] ([id number]) -------------------------------------------------------------------------------- [place] Details Patient Name: Hartog, Chiyo V. Date of Service: 05/28/2017 Medical Record Number: [id number] Patient Account Number: [id number] Date of Birth/Sex: 30-Nov-1940 (76 y.o. 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 E11.52 Type 2 diabetes mellitus with diabetic peripheral angiopathy with gangrene I70.232 Atherosclerosis of native arteries  of right leg with ulceration of calf I70.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 of foot Facility Procedures CPT4 Code Description: [id number] 99212 - WOUND CARE VISIT-LEV 2 EST PT Modifier: Quantity: 1 CPT4 Code Description: [id number] 11044 - DEB BONE 20 SQ CM/< ICD-10 Diagnosis  Description I70.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 of L97.519 Non-pressure chronic ulcer of other part of right foot with unspec E11.621 Type 2  diabetes mellitus with foot ulcer Modifier: other part of foot ified severity Quantity: 1 Physician Procedures CPT4: Description Modifier Quantity Code [id number] Debridement; bone (includes epidermis, dermis, subQ tissue, muscle and/or fascia, if 1 performed) 1st 20 sqcm or less ICD-10 Diagnosis Description I70.235 Atherosclerosis of native arteries of right leg  with ulceration of other part of foot L97.519 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

## 2017-06-04 ENCOUNTER — Encounter: Payer: Medicare HMO | Admitting: Physician Assistant

## 2017-06-04 DIAGNOSIS — E11622 Type 2 diabetes mellitus with other skin ulcer: Secondary | ICD-10-CM | POA: Diagnosis not present

## 2017-06-07 NOTE — Progress Notes (Signed)
[name], [name] ([id number]) Visit Report for 06/04/2017 Chief Complaint Document Details Patient Name: [name], [name] Date of Service: 06/04/2017 10:15 AM Medical Record Number: [id number] Patient Account Number: [id number] Date of Birth/Sex: 1940-11-01 (76 y.o. Female) Treating RN: [name] Primary Care Provider: Beverlyn [name] Other Clinician: Referring Provider: Beverlyn [name] Treating Provider/Extender: [name], [name] Weeks in Treatment: 30 Information Obtained from: Patient Chief Complaint The patient is here for initial evaluation of RLE and Right foot/toe ulcers Electronic Signature(s) Signed: 06/04/2017 12:07:21 PM By: [name] Keeler PA-C Entered By: [name] on 06/04/2017 10:16:23 [name], [name] ([id number]) -------------------------------------------------------------------------------- HPI Details Patient Name: [name], [name] Date of Service: 06/04/2017 10:15 AM Medical Record Number: [id number] Patient Account Number: [id number] Date of Birth/Sex: 10/08/1940 (76 y.o. Female) Treating RN: [name] Primary Care Provider: Beverlyn [name] Other Clinician: Referring Provider: Beverlyn [name] Treating Provider/Extender: [name], [name] Weeks in Treatment: 30 History of Present Illness HPI Description: 08/13/16: This is a 76 year old woman who came predominantly for review of 3 cm in diameter circular wound to the left anterior lateral leg. She was in the ER on 08/01/16 I reviewed their notes. There was apparently pus coming out of the wound at that time and the patient arrived requesting debridement which they don't do in the emergency room. Nevertheless I can't see that they did any x-rays. There were no cultures done. She is a type II diabetic and I a note after the patient was in the clinic that she had a bypass graft from the popliteal to the tibial on the right on 02/28/16. She also had a right greater saphenous vein harvest on the same date for arterial bypass. She is  going to have vascular studies including ABIs T ABIs on the right on 08/28/16. The patient's surgery was on 02/28/16 by [name] she had a right below the knee popliteal artery to peroneal artery bypass with reverse greater saphenous vein and an endarterectomy of the mid segment peroneal artery. Postoperatively she had a strong mild monophasic peroneal signal with a pink foot. It would appear that the patient is had some nonhealing in the surgical saphenous vein harvest site on the left leg. Surprisingly looking [facility] I cannot see much information about this at all. [name] notes from 05/29/16 show that the patient's wounds "are not healed" the right first metatarsal wound healed but then opened back up. 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 At that point the patient had a wound VAC to her right calf with regards to the wounds on her dorsal right toe would appear that these are felt to be arterial wounds. She has had surgery on the metatarsal phalangeal in 2015 I [name] secondary to a right metatarsal phalangeal joint fracture. She is apparently had discoloration around this area since then. 08/28/16; patient arrives with her wounds in much the same condition. The linear vein harvest site and the circular wound below it which I think was a blister. She also has to probing holes in her right great toe and a necrotic eschar on the right second toe. Because of these being arterial wounds I reduced her compression from 3-2 layers this seems to of done satisfactorily she has not had any problems. I cannot see that she is actually had an x-ray ====== 11/06/16 the patient comes in for evaluation of her right lower extremity ulcers. She was here in  January 2018 for 2 visits subsequently ended up in the hospital with pneumonia and then to rehabilitation. She has now been  discharged from rehabilitation and is home. She has multiple ulcerations to her right lower extremity including the foot and toes. She does have home health in place and they have been placing alginate to the ulcers. She is followed by [name] of vascular medicine. 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 She recently saw him on 3/23. In office ABIs were: Right 0.48 with monophasic flow to the DP, PT, peroneal Left 0.63 with monophasic flow to the DP and PT Her arterial studies indicated a patent right below knee popliteal to peroneal bypass She had an MRI in February 2008 that was negative frosty myelitis but this showed general soft tissue edema in the right foot and lower extremity concerning for cellulitis She is a diabetic, managed with insulin. Her hemoglobin A1c in December 2017 was 8.4 which is a trend up from previous levels. She had blood work in February 2018 which revealed an albumin of 2.6 this appears to be relatively acute as an albumin in November 2017 was 3.7 11/13/16; this is a patient I have not seen since February who is readmitted to our clinic last week. She is a type II diabetic on insulin with known severe PAD status post revascularization in the left leg by [name] I have reviewed [name] notes from March/23/18. Doppler ABI on that date showed an ABI on the right of 0.48 and on the left of 0.63. Dorsalis pedis waveforms were monophasic bilaterally. There was no waveforms detected at the posterior tibial on the right, monophasic on the left. [name] comments were that this patient would have follow-up vascular studies in 3 months including ABIs and right lower extremity arterial duplex. She had an MRI in February that was negative for osteomyelitis but showed generalized edema in the foot. Last albumin I see was in January at 3.4 we have been using Santyl to the 4 wounds on the right leg [name],  [name] ([id number]) the patient is noted today to have widespread edema well up towards her groin this is pitting 2-3+. 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 Normal ejection fraction. 11/20/16; patient has a follow-up appointment with [name] on April 23. She is still complaining of a lot of pain in the right foot and right leg. It is not clear to me that this is at all positional however I think it is clear claudication with minimal activity perhaps at rest. At our suggestion she is return to her primary physician's office tomorrow with regards to her pitting lower extremity bilateral edema that I reviewed in detail last week 11/27/16; the follow-up with [name] was actually on May 23 on April 23 as I stated in my note last week. N/A case all of her wounds seems somewhat smaller. The 2 on the right leg are definitely smaller. The areas on the dorsal right first toe, right third toe and the lateral part of the right fifth metatarsal head all looks smaller but have tightly adherent surfaces. We have been using Santyl 12/04/16; follow-up with [name] on May 23. 2 small open wounds on the right leg continue to get smaller. The area on the right third total lateral aspect of the right fifth toe also look better. The remaining area on  the dorsal first toe still has some depth to it. We have been using Santyl to the toes and collagen on the right 12/11/16; according to patient's husband the follow-up with [name] is not until July. All of the wounds on the right leg are measuring smaller. We have been using some combination of Prisma and Santyl although I think we can go to straight Prisma today. There may have been some confusion with home health about the primary dressing orders here. 12/25/16; the patient has had some healing this week. The area on her right lateral fifth metatarsal head, right third toe are both healed lower right leg is  healed. In the vein harvest site superiorly she has one superficial open area. On the dorsal aspect of her right great toe/MTP joint the wound is now divided into 2 however the proximal area is deep and there is palpable bone 01/01/17; still an open area in the middle of her original right surgical scar. The area on the right third toe and right lateral fifth metatarsal head remained closed. Problematic area on the dorsal aspect of the toe. Previous surgery in this area line 01/08/17 small open area on the original scar on her upper anterior leg although this is closing. X-ray I did of the right first toe did not show underlying bony abnormality. Still this area on the dorsal first toe probes to bone. We have been using Prisma 01/15/17; small open area in the original scar in the upper anterior leg is almost fully closed. She has 2 open areas over the dorsal aspect of the right first toe that probes to bone. Used and a form starting last week. Vigorous bone scraping that I did last week showed few methicillin sensitive staph aureus. She is allergic to penicillin and sulfa drugs. I'm going to give her 2 weeks of doxycycline. She will need an MRI with contrast. 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 She continues on doxycycline for a bone scraping that showed methicillin sensitive staph aureus [allergic to penicillin and sulfa]. We have been using Endo form to the wound. 01/29/17; surprisingly her MRI did not show osteomyelitis. She continues on doxycycline for a bone scraping that showed methicillin sensitive staph aureus with allergies to penicillin and sulfa. We have been using Endo form to the wound. Unfortunately I cannot get a surface on this visit looks like it is able to support a healing state. Proximally there is still exposed bone. There is no overt soft tissue tenderness. Her MRI did show a previous fracture was  surgery to this area but no hardware 02/12/17 on evaluation today patient appears to be doing well in regard to her lower extremity wound. She does have some mild discomfort but this is minimal. There's no evidence of infection. Her left great toe nail has somewhat been lifted up and there is a little bit of slight bleeding underneath but this is still firmly attached. 02/19/17; she ran out of doxycycline 2 days ago. Nevertheless I would like to continue this for 2 weeks to make a full 6 weeks of therapy as the bone scraping that I did from the open area on the dorsal right first toe showed MRSA. This should complete antibiotic therapy. 02/26/17; the patient is completing her 6 weeks of oral doxycycline. Deep wound on the dorsal right toe. This still has some exposed bone proximally. She does have a reasonably  granulated surface albeit thin over bone. I've been using Endo form have made application for an amniotic skin sub 03/05/17; the patient has completed 6 weeks of doxycycline. This is a deep wound on the dorsal right toe which has exposed bone proximally. She has granulation over most of this wound albeit a thin layer. I've been using Endo form. Still do not have approval for Affinity 03/13/17 on evaluation today patient's right foot wound appears to be doing better measurement wise compared to her last evaluation. She has been tolerating the dressing change without complication and does have a appointment with the dietary that has been made as far as referral is concerned there just waiting for her and transportation to contact them back for an actual date. Nonetheless patient has been having less pain at this point. No fevers, chills, nausea, or vomiting noted at this time. 03/26/17; linear wound over the dorsal aspect of her right great toe. At one point this had exposed bone on the most superior aspect however I am pleased to see today that this appears to have a surface of granulation.  Apparently product was applied for but denied by [facility] We'll need to see what that was. The patient has known PAD status post revascularization. MRI did not show osteomyelitis which was done in June 04/02/17; continued improvement using Endoform. She was approved for [employer] but hasn't over $409 co-pay per application, this is beyond her means [name], [name] ([id number]) 04/09/17; continues on Endoform. 04/16/17; appears to be doing nicely continuing on Endoform 04/22/17; patient did not have her dressing changed all week because of the weather. Using Endoform. Base of the wound looks healthy elbow there appears to events surrounding maceration perhaps because of the drainage was not changing the wound dressing 04/30/17 wound today continues to close and except for a small divot on the superior part of the wound. This area did not probe the bone but I found this a little concerning as this was the area with exposed bone before we be able to get this to granulate forward. As I remember things she is not a candidate for skin substitutes secondary to an outlandish co-pay. I asked her husband to look into the out of pocket max for medications if he has 1 [Regranex] or totally for skin substitutes 05/07/17; the patient arrives today with a wound roughly the same size although under careful inspection under the light there appears to be some epithelialization. Her intake nurse noted that the Endoform seemed to be placed over the wound rather than in the deeper divots they could really benefit from the Endoform. At this point I have no plans to change the Endoform as at one point this was at least 33% exposed bone and the rest of the wound very close to the underlying phalanx 05/14/17; no major change in the size or appearance of the wound. We have been using Endoform for a prolonged period of time with reasonably good improvement in the epithelialization i.e. no exposed bone especially proximally. Unless  we've not made a lot of changes in the last several weeks. She does not complain of pain. She was revascularized early this year or perhaps late last year by [name] and I wonder if he will need to have a look at her, I will need to review the actual vascular procedure which I think was a distal popliteal bypass 05/21/17; switched to either for a blue last week. Patient arrived in clinic with the wound not measuring any  better but the surface looking some better. Unfortunately she had some surface slough and when I went to remove this superiorly she had exposed bone. Previously she had had exposed bone in the inferior part of the wound however this is granulated over some weeks ago. Clearly this is a major step back for her. With some difficulty I was able to obtain a piece of the bone probing through the superior part of the wound for culture. We did not send pathology. It is likely that she is going to need imaging of this site but I did not order this today 05/28/17; bone culture I took last week showed MSSA. She still has exposed bone however I could not get another piece for pathology. She had an MRI 4 months ago that did not show osteoarthritic at time. Wound rapidly deteriorated superiorly and I suspect this is underlying osteomyelitis. We have been using Hydrofera Blue 06/04/17 on evaluation today patient's wound appears to actually be doing a little bit better visually compared to last week's evaluation which is good news. Nonetheless she does have osteomyelitis of the toe which does not appear to be MRSA. No fevers, chills, nausea, or vomiting noted at this time. She is having no discomfort. Electronic Signature(s) Signed: 06/04/2017 12:07:21 PM By: [name] Keeler PA-C Entered By: [name] on 06/04/2017 10:27:28 [name], [name] ([id number]) -------------------------------------------------------------------------------- Physical Exam Details Patient Name: Gause, Viona V. Date  of Service: 06/04/2017 10:15 AM Medical Record Number: [id number] Patient Account Number: [id number] Date of Birth/Sex: 02-04-1941 (76 y.o. Female) Treating RN: [name] Primary Care Provider: Beverlyn [name] Other Clinician: Referring Provider: Beverlyn [name] Treating Provider/Extender: STONE III, [name] Weeks in Treatment: 30 Constitutional Obese and well-hydrated in no acute distress. Respiratory normal breathing without difficulty. clear to auscultation bilaterally. Cardiovascular regular rate and rhythm with normal S1, S2. Psychiatric this patient is able to make decisions and demonstrates good insight into disease process. Alert and Oriented x 3. pleasant and cooperative. Notes Patient's wound appears to be doing better at this point on evaluation visually. She is not currently on an antibiotic fortunately there is no surrounding erythema. No debridement required. Electronic Signature(s) Signed: 06/04/2017 12:07:21 PM By: [name] Keeler PA-C Entered By: [name] on 06/04/2017 10:29:41 [name], [name] ([id number]) -------------------------------------------------------------------------------- Physician Orders Details Patient Name: Deschler, Jaidee V. Date of Service: 06/04/2017 10:15 AM Medical Record Number: [id number] Patient Account Number: [id number] Date of Birth/Sex: 1940-08-29 (76 y.o. 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 o Cleanse wound with mild soap and water Anesthetic Wound #7 Right,Dorsal Metatarsal head first o Topical Lidocaine 4% cream applied to wound bed prior to debridement Primary Wound Dressing Wound #7 Right,Dorsal Metatarsal head first o Hydrafera Blue Secondary Dressing Wound #  7 Right,Dorsal Metatarsal head first o ABD pad o Conform/Kerlix Dressing Change Frequency Wound #7 Right,Dorsal Metatarsal head first o Change Dressing Monday, Wednesday, Friday Follow-up Appointments Wound #7 Right,Dorsal Metatarsal head first o Return Appointment in 1 week. Edema Control Wound #7 Right,Dorsal Metatarsal head first [name], [name] ([id number]) o Elevate legs to the level of the heart and pump ankles as often as possible Additional Orders / Instructions Wound #7 Right,Dorsal Metatarsal head first o Increase protein intake. o Activity as tolerated Home Health Wound #7 Right,Dorsal Metatarsal head first o Continue Home Health Visits - [facility] weekly, [facility] o Home Health Nurse may visit PRN to address patientos wound care needs. o FACE TO FACE ENCOUNTER: MEDICARE and MEDICAID PATIENTS: I certify that this patient is under my care and that I had a face-to-face encounter that meets the physician face-to-face encounter requirements with this patient on this date. The encounter with the patient was in whole or in part for the following MEDICAL CONDITION: (primary reason for [place]) MEDICAL NECESSITY: I certify, that based on my findings, NURSING services are a medically necessary home health  service. HOME BOUND STATUS: I certify that my clinical findings support that this patient is homebound (i.e., Due to illness or injury, pt requires aid of supportive devices such as crutches, cane, wheelchairs, walkers, the use of special transportation or the assistance of another person to leave their place of residence. There is a normal inability to leave the home and doing so requires considerable and taxing effort. Other absences are for medical reasons / religious services and are infrequent or of short duration when for other reasons). o If current dressing causes regression in wound condition, may D/C ordered dressing product/s and apply Normal Saline Moist Dressing daily until next [place] / Other MD appointment. [place] of regression in wound condition at [phone]. o Please direct any NON-WOUND related issues/requests for orders to patient's Primary Care Physician Medications-please add to medication list. Wound #7 Right,Dorsal Metatarsal head first o P.O. Antibiotics Patient Medications Allergies: penicillin, sulfamethizole, iodine, Shellfish Containing Products, eggshell membrane Notifications Medication Indication Start End doxycycline hyclate 06/04/2017 DOSE 1 - oral 100 mg capsule - 1 capsule oral taken 2 times a day 1 hour before meals or else 2 hours after meals Electronic Signature(s) Signed: 06/04/2017 10:31:40 AM By: [name] Keeler PA-C Entered By: [name] on 06/04/2017 10:31:39 Albor, Bernyce V. ([id number]) -------------------------------------------------------------------------------- Problem List Details Patient Name: Shorts, Hazelle V. Date of Service: 06/04/2017 10:15 AM Medical Record Number: [id number] Patient Account Number: [id number] Date of Birth/Sex: [birth year] (76 y.o. 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 ([id number]) -------------------------------------------------------------------------------- Progress Note Details Patient Name: [name], [name] Date of Service: 06/04/2017 10:15 AM Medical Record Number: [id number] Patient Account Number: [id number] Date of Birth/Sex: 1941/06/20 (77 y.o. Female) Treating RN: [name] Primary Care Provider: Beverlyn [name] Other Clinician: Referring Provider: Beverlyn [name] Treating Provider/Extender: [name], [name] Weeks in Treatment: 30 Subjective Chief Complaint Information obtained from Patient The patient is here for initial evaluation of RLE and Right foot/toe ulcers History of Present Illness (HPI) 08/13/16: This is a 76 year old woman who came predominantly for review of 3 cm in diameter circular wound to the left anterior lateral leg. She was in the ER on 08/01/16 I reviewed their notes. There was apparently pus coming out of the wound at that time and  the patient arrived requesting debridement which they don't do in the emergency room. Nevertheless I can't see that they did any x-rays. There were no cultures done. She is a type II diabetic and I a note after the patient was in the clinic that she had a bypass graft from the popliteal to the tibial on the right on 02/28/16. She also had a right greater saphenous vein harvest on the same date for arterial bypass. She is going to have vascular studies including ABIs T ABIs on the right on 08/28/16. The patient's surgery was on 02/28/16 by [name] she had a right below the knee popliteal artery to peroneal artery bypass with reverse greater saphenous vein and an endarterectomy of the mid segment peroneal artery. Postoperatively she had a strong mild monophasic peroneal signal with a pink foot. It would appear that the patient is had some nonhealing in the surgical saphenous vein harvest site on the left leg. Surprisingly looking through [facility] I cannot see much information about this at all. [name] notes from 05/29/16 show that the patient's wounds "are not healed" the right first metatarsal wound healed but then opened back up. 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 At that point the patient had a wound VAC to her right calf with regards to the wounds on her dorsal right toe would appear that these are felt to be arterial wounds. She has had surgery on the metatarsal phalangeal in 2015 I [name] secondary to a right metatarsal phalangeal joint fracture. She is apparently had discoloration around this area since then. 08/28/16; patient arrives with her wounds in much the same condition. The linear vein harvest site and the circular wound below it which I think was a blister. She also has to probing holes in her right great toe and a necrotic eschar on the right second toe. Because  of these being arterial wounds I reduced her compression from 3-2 layers this seems to of done satisfactorily she has not had any problems. I cannot see that she is actually had an x-ray ====== 11/06/16 the patient comes in for evaluation of her right lower extremity ulcers. She was here in January 2018 for 2 visits subsequently ended up in the hospital with pneumonia and then to rehabilitation. She has now been discharged from rehabilitation and is home. She has multiple ulcerations to her right lower extremity including the foot and toes. She does have home health in place and they have been placing alginate to the ulcers. She is followed by [name] of vascular medicine. She is status post  a bypass graft to the right below knee popliteal to peroneal using reverse GSV in July 2017. She recently saw him on 3/23. In office ABIs were: Right 0.48 with monophasic flow to the DP, PT, peroneal Left 0.63 with monophasic flow to the DP and PT Her arterial studies indicated a patent right below knee popliteal to peroneal bypass She had an MRI in February 2008 that was negative frosty myelitis but this showed general soft tissue edema in the right foot and lower extremity concerning for cellulitis She is a diabetic, managed with insulin. Her hemoglobin A1c in December 2017 was 8.4 which is a trend up from previous levels. She had blood work in February 2018 which revealed an albumin of 2.6 this appears to be relatively acute as an albumin in November 2017 was 3.7 11/13/16; this is a patient I have not seen since February who is readmitted to our clinic last week. She is a type II diabetic on [name], [name] ([id number]) insulin with known severe PAD status post revascularization in the left leg by [name] I have reviewed [name] notes from March/23/18. Doppler ABI on that date showed an ABI on the right of 0.48 and on the left of 0.63. Dorsalis pedis waveforms were monophasic bilaterally. There was no  waveforms detected at the posterior tibial on the right, monophasic on the left. [name] comments were that this patient would have follow-up vascular studies in 3 months including ABIs and right lower extremity arterial duplex. She had an MRI in February that was negative for osteomyelitis but showed generalized edema in the foot. Last albumin I see was in January at 3.4 we have been using Santyl to the 4 wounds on the right leg the patient is noted today to have widespread edema well up towards her groin this is pitting 2-3+. 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 Normal ejection fraction. 11/20/16; patient has a follow-up appointment with [name] on April 23. She is still complaining of a lot of pain in the right foot and right leg. It is not clear to me that this is at all positional however I think it is clear claudication with minimal activity perhaps at rest. At our suggestion she is return to her primary physician's office tomorrow with regards to her pitting lower extremity bilateral edema that I reviewed in detail last week 11/27/16; the follow-up with [name] was actually on May 23 on April 23 as I stated in my note last week. N/A case all of her wounds seems somewhat smaller. The 2 on the right leg are definitely smaller. The areas on the dorsal right first toe, right third toe and the lateral part of the right fifth metatarsal head all looks smaller but have tightly adherent surfaces. We have been using Santyl 12/04/16; follow-up with [name] on May 23. 2 small open wounds on the right leg continue to get smaller. The area on the right third total lateral aspect of the right fifth toe also look better. The remaining area on the dorsal first toe still has some depth to it. We have been using Santyl to the toes and collagen on the right 12/11/16; according to patient's husband the follow-up with [name] is not until July. All  of the wounds on the right leg are measuring smaller. We have been using some combination of Prisma and Santyl although I think we can go to straight Prisma today. There may  have been some confusion with home health about the primary dressing orders here. 12/25/16; the patient has had some healing this week. The area on her right lateral fifth metatarsal head, right third toe are both healed lower right leg is healed. In the vein harvest site superiorly she has one superficial open area. On the dorsal aspect of her right great toe/MTP joint the wound is now divided into 2 however the proximal area is deep and there is palpable bone 01/01/17; still an open area in the middle of her original right surgical scar. The area on the right third toe and right lateral fifth metatarsal head remained closed. Problematic area on the dorsal aspect of the toe. Previous surgery in this area line 01/08/17 small open area on the original scar on her upper anterior leg although this is closing. X-ray I did of the right first toe did not show underlying bony abnormality. Still this area on the dorsal first toe probes to bone. We have been using Prisma 01/15/17; small open area in the original scar in the upper anterior leg is almost fully closed. She has 2 open areas over the dorsal aspect of the right first toe that probes to bone. Used and a form starting last week. Vigorous bone scraping that I did last week showed few methicillin sensitive staph aureus. She is allergic to penicillin and sulfa drugs. I'm going to give her 2 weeks of doxycycline. She will need an MRI with contrast. 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 She continues on doxycycline for a bone scraping that showed methicillin sensitive staph aureus [allergic to penicillin and sulfa]. We have been using Endo form to the wound. 01/29/17; surprisingly her MRI did not show  osteomyelitis. She continues on doxycycline for a bone scraping that showed methicillin sensitive staph aureus with allergies to penicillin and sulfa. We have been using Endo form to the wound. Unfortunately I cannot get a surface on this visit looks like it is able to support a healing state. Proximally there is still exposed bone. There is no overt soft tissue tenderness. Her MRI did show a previous fracture was surgery to this area but no hardware 02/12/17 on evaluation today patient appears to be doing well in regard to her lower extremity wound. She does have some mild discomfort but this is minimal. There's no evidence of infection. Her left great toe nail has somewhat been lifted up and there is a little bit of slight bleeding underneath but this is still firmly attached. 02/19/17; she ran out of doxycycline 2 days ago. Nevertheless I would like to continue this for 2 weeks to make a full 6 weeks of therapy as the bone scraping that I did from the open area on the dorsal right first toe showed MRSA. This should complete antibiotic therapy. 02/26/17; the patient is completing her 6 weeks of oral doxycycline. Deep wound on the dorsal right toe. This still has some exposed bone proximally. She does have a reasonably granulated surface albeit thin over bone. I've been using Endo form have made application for an amniotic skin sub 03/05/17; the patient has completed 6 weeks of doxycycline. This is a deep wound on the dorsal right toe which has exposed bone proximally. She has granulation over most of this wound albeit a thin layer. I've been using Endo form. Still do not have approval for [employer] 03/13/17 on evaluation today  patient's right foot wound appears to be doing better measurement wise compared to her last evaluation. She has been tolerating the dressing change without complication and does have a appointment with the dietary that has been made as far as referral is concerned there just  waiting for her and transportation to contact them back for an actual date. Nonetheless patient has been having less pain at this point. No fevers, chills, nausea, or vomiting noted at this time. DRAKE, LANDING ([id number]) 03/26/17; linear wound over the dorsal aspect of her right great toe. At one point this had exposed bone on the most superior aspect however I am pleased to see today that this appears to have a surface of granulation. Apparently product was applied for but denied by [facility]. We'll need to see what that was. The patient has known PAD status post revascularization. MRI did not show osteomyelitis which was done in June 04/02/17; continued improvement using Endoform. She was approved for [facility] but hasn't over $458 co-pay per application, this is beyond her means 04/09/17; continues on Endoform. 04/16/17; appears to be doing nicely continuing on Endoform 04/22/17; patient did not have her dressing changed all week because of the weather. Using Endoform. Base of the wound looks healthy elbow there appears to events surrounding maceration perhaps because of the drainage was not changing the wound dressing 04/30/17 wound today continues to close and except for a small divot on the superior part of the wound. This area did not probe the bone but I found this a little concerning as this was the area with exposed bone before we be able to get this to granulate forward. As I remember things she is not a candidate for skin substitutes secondary to an outlandish co-pay. I asked her husband to look into the out of pocket max for medications if he has 1 [Regranex] or totally for skin substitutes 05/07/17; the patient arrives today with a wound roughly the same size although under careful inspection under the light there appears to be some epithelialization. Her intake nurse noted that the Endoform seemed to be placed over the wound rather than in the deeper divots they could really benefit from the  Endoform. At this point I have no plans to change the Endoform as at one point this was at least 33% exposed bone and the rest of the wound very close to the underlying phalanx 05/14/17; no major change in the size or appearance of the wound. We have been using Endoform for a prolonged period of time with reasonably good improvement in the epithelialization i.e. no exposed bone especially proximally. Unless we've not made a lot of changes in the last several weeks. She does not complain of pain. She was revascularized early this year or perhaps late last year by [name] and I wonder if he will need to have a look at her, I will need to review the actual vascular procedure which I think was a distal popliteal bypass 05/21/17; switched to either for a blue last week. Patient arrived in clinic with the wound not measuring any better but the surface looking some better. Unfortunately she had some surface slough and when I went to remove this superiorly she had exposed bone. Previously she had had exposed bone in the inferior part of the wound however this is granulated over some weeks ago. Clearly this is a major step back for her. With some difficulty I was able to obtain a piece of the bone probing through the  superior part of the wound for culture. We did not send pathology. It is likely that she is going to need imaging of this site but I did not order this today 05/28/17; bone culture I took last week showed MSSA. She still has exposed bone however I could not get another piece for pathology. She had an MRI 4 months ago that did not show osteoarthritic at time. Wound rapidly deteriorated superiorly and I suspect this is underlying osteomyelitis. We have been using Hydrofera Blue 06/04/17 on evaluation today patient's wound appears to actually be doing a little bit better visually compared to last week's evaluation which is good news. Nonetheless she does have osteomyelitis of the toe which does  not appear to be MRSA. No fevers, chills, nausea, or vomiting noted at this time. She is having no discomfort. Patient History Information obtained from Patient. Social History Never smoker, Marital Status - Married, Alcohol Use - Never, Drug Use - No History, Caffeine Use - Daily. Medical History Hospitalization/Surgery History - 09/16/[facility] Double P[facility]. Medical And Surgical History Notes Cardiovascular Stroke 2017 Review of Systems (ROS) Constitutional Symptoms (General Health) Denies complaints or symptoms of Fever, Chills. Respiratory The patient has no complaints or symptoms. Cardiovascular The patient has no complaints or symptoms. [name], [name] ([id number]) Psychiatric Complains or has symptoms of Anxiety. Objective Constitutional Obese and well-hydrated in no acute distress. Vitals Time Taken: 10:01 AM, Height: 64 in, Weight: 200 lbs, BMI: 34.3, Temperature: 98.1 F, Pulse: 75 bpm, Respiratory Rate: 16 breaths/min, Blood Pressure: 157/64 mmHg. Respiratory normal breathing without difficulty. clear to auscultation bilaterally. Cardiovascular regular rate and rhythm with normal S1, S2. Psychiatric this patient is able to make decisions and demonstrates good insight into disease process. Alert and Oriented x 3. pleasant and cooperative. General Notes: Patient's wound appears to be doing better at this point on evaluation visually. She is not currently on an antibiotic fortunately there is no surrounding erythema. No debridement required. Integumentary (Hair, Skin) Wound #7 status is Open. Original cause of wound was Gradually Appeared. The wound is located on the Right,Dorsal Metatarsal head first. The wound measures 2cm length x 0.8cm width x 0.3cm depth; 1.257cm^2 area and 0.377cm^3 volume. There is Fat Layer (Subcutaneous Tissue) Exposed exposed. There is no tunneling or undermining noted. There is a medium amount of serous drainage noted. The wound  margin is flat and intact. There is medium (34-66%) red granulation within the wound bed. There is a medium (34-66%) amount of necrotic tissue within the wound bed including Adherent Slough. The periwound skin appearance exhibited: Induration, Hemosiderin Staining. The periwound skin appearance did not exhibit: Callus, Crepitus, Excoriation, Rash, Scarring, Dry/Scaly, Maceration, Atrophie Blanche, Cyanosis, Ecchymosis, Mottled, Pallor, Rubor, Erythema. The periwound has tenderness on palpation. 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 ([id number]) I70.235 - 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.371 - Chronic multifocal osteomyelitis, right ankle and foot Plan Wound Cleansing: Wound #7 Right,Dorsal Metatarsal head first: Clean wound with Normal Saline. Cleanse wound with mild soap and water Anesthetic: Wound #7 Right,Dorsal Metatarsal  head first: Topical Lidocaine 4% cream applied to wound bed prior to debridement Primary Wound Dressing: Wound #7 Right,Dorsal Metatarsal head first: Hydrafera Blue Secondary Dressing: Wound #7 Right,Dorsal Metatarsal head first: ABD pad Conform/Kerlix Dressing Change Frequency: Wound #7 Right,Dorsal Metatarsal head first: Change Dressing Monday, Wednesday, Friday Follow-up Appointments: Wound #7 Right,Dorsal Metatarsal head first: Return Appointment in 1 week. Edema Control: Wound #7 Right,Dorsal Metatarsal head first: Elevate legs to the level of the  heart and pump ankles as often as possible Additional Orders / Instructions: Wound #7 Right,Dorsal Metatarsal head first: Increase protein intake. Activity as tolerated Home Health: Wound #7 Right,Dorsal Metatarsal head first: [place] [facility] weekly, [facility] Home Health Nurse may visit PRN to address patient s wound care needs. FACE TO FACE ENCOUNTER: MEDICARE and MEDICAID PATIENTS: I certify that this patient is under my care and that I had a face-to-face encounter that meets the physician face-to-face encounter requirements with this patient on this date. The encounter with the patient was in whole or in part for the following MEDICAL CONDITION: (primary reason for [place]) MEDICAL NECESSITY: I certify, that based on my findings, NURSING services are a medically necessary home health service. HOME BOUND STATUS: I certify that my clinical findings support that this patient is homebound (i.e., Due to illness or injury, pt requires aid of supportive devices such as crutches, cane, wheelchairs, walkers, the use of special transportation or the assistance of another person to leave their place of residence. There is a normal inability to leave the home and doing so requires considerable and taxing effort. Other absences are for medical reasons / religious services and are infrequent or of short duration when for other reasons). If current dressing causes regression in wound condition, may D/C ordered dressing product/s and apply Normal Saline Moist Dressing daily until next [place] / Other MD appointment. [place] of regression in wound condition at [phone]. Please direct any NON-WOUND related issues/requests for orders to patient's Primary Care Physician Medications-please add to medication list.: Wound #7 Right,Dorsal Metatarsal head first: P.O. Antibiotics The following medication(s) was  prescribed: doxycycline hyclate oral 100 mg capsule 1 1 capsule oral taken 2 times a day 1 Kempen, Denice V. ([id number]) hour before meals or else 2 hours after meals starting 06/04/2017 We are gonna continue with the Current wound care measures per above. Please see above for specific measures. I am going to place her on doxycycline for the next two weeks until she has a follow-up appointment with Dr. Ola Spurr and then we will go from there. If anything worsens significantly in the interim she will contact our office or have a representative do so for additional recommendations. Electronic Signature(s) Signed: 06/04/2017 12:07:21 PM By: [name] Keeler PA-C Entered By: [name] on 06/04/2017 10:32:16 [name], Surena V. ([id number]) -------------------------------------------------------------------------------- ROS/PFSH Details Patient Name: Loschiavo, Delonna V. Date of Service: 06/04/2017 10:15 AM Medical Record Number: [id number] Patient Account Number: [id number] Date of Birth/Sex: [birth year] (76 y.o. Female) Treating RN: [name] Primary Care Provider: Beverlyn [name] Other Clinician: Referring Provider: Beverlyn [name] Treating Provider/Extender: [name], [name] Weeks in Treatment: 30 Information Obtained From Patient Wound History Do you currently have one or more open woundso Yes How many open wounds do you currently haveo 4 Approximately how long have you had your woundso 2 years How have you been treating your wound(s) until nowo aquacel ag Has your wound(s) ever healed and then re-openedo No Have  you had any lab work done in the past montho No Have you tested positive for an antibiotic resistant organism (MRSA, VRE)o No Have you had any tests for circulation on your legso Yes Where was the test doneo [place] Constitutional Symptoms (General Health) Complaints and Symptoms: Negative for: Fever; Chills Psychiatric Complaints and Symptoms: Positive for: Anxiety Medical  History: Negative for: Anorexia/bulimia; Confinement Anxiety Eyes Medical History: Positive for: Cataracts - removed Negative for: Glaucoma; Optic Neuritis Ear/Nose/Mouth/Throat Medical History: Positive for: Chronic sinus problems/congestion Negative for: Middle ear problems Hematologic/Lymphatic Medical History: Negative for: Anemia; Hemophilia; Human Immunodeficiency Virus; Lymphedema; Sickle Cell Disease Respiratory Complaints and Symptoms: No Complaints or Symptoms Medical History: Negative for: Aspiration; Asthma; Chronic Obstructive Pulmonary Disease (COPD); Pneumothorax; Sleep Apnea; Ovando, Becci V. ([id number]) Tuberculosis Cardiovascular Complaints and Symptoms: No Complaints or Symptoms Medical History: Positive for: Congestive Heart Failure; Hypertension Negative for: Angina; Arrhythmia; Coronary Artery Disease; Deep Vein Thrombosis; Hypotension; Myocardial Infarction; Peripheral Arterial Disease; Peripheral Venous Disease; Phlebitis Past Medical History Notes: Stroke 2017 Gastrointestinal Medical History: Negative for: Cirrhosis ; Colitis; Crohnos; Hepatitis A; Hepatitis B; Hepatitis C Endocrine Medical History: Positive for: Type II Diabetes Negative for: Type I Diabetes Time with diabetes: 13 years Treated with: Insulin Blood sugar tested every day: Yes Tested : 2 Blood sugar testing results: Breakfast: 202 Genitourinary Medical History: Negative for: End Stage Renal Disease Immunological Medical History: Negative for: Lupus Erythematosus; Raynaudos; Scleroderma Integumentary (Skin) Medical History: Negative for: History of Burn; History of pressure wounds Musculoskeletal Medical History: Negative for: Gout; Rheumatoid Arthritis; Osteoarthritis; Osteomyelitis Neurologic Medical History: Negative for: Dementia; Neuropathy; Quadriplegia; Paraplegia; Seizure Disorder Oncologic REJINA, ODLE ([id number]) Medical History: Negative for: Received  Chemotherapy; Received Radiation HBO Extended History Items Eyes: Ear/Nose/Mouth/Throat: Cataracts Chronic sinus problems/congestion Immunizations Pneumococcal Vaccine: Received Pneumococcal Vaccination: Yes Implantable Devices Hospitalization / Surgery History Name of Hospital Purpose of Hospitalization/Surgery Date [facility] Double [facility]neum[facility] 09/16/2016 Family and Social History Never smoker; Marital Status - Married; Alcohol Use: Never; Drug Use: No History; Caffeine Use: Daily; Advanced Directives: No; Patient does not want information on Advanced Directives; Living Will: No; Medical Power of Attorney: No Physician Affirmation I have reviewed and agree with the above information. Electronic Signature(s) Signed: 06/04/2017 12:07:21 PM By: [name] Keeler PA-C Signed: 06/05/2017 5:34:16 PM By: Gretta Cool, BSN, RN, CWS, Kim RN, BSN Entered By: [name] on 06/04/2017 10:28:56 Stantz, Glady [name] ([id number]) -------------------------------------------------------------------------------- SuperBill Details Patient Name: Choy, Xandrea V. Date of Service: 06/04/2017 Medical Record Number: [id number] Patient Account Number: [id number] Date of Birth/Sex: 1941-04-29 (76 y.o. 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 I70.235 Atherosclerosis of native arteries of right leg with  ulceration of other part of foot 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, right ankle and foot Facility Procedures CPT4 Code: 76734193 Description: 79024 - WOUND CARE VISIT-LEV 3 EST PT Modifier: Quantity: 1 Physician Procedures CPT4 Code Description: 0973532 99214 - WC PHYS LEVEL 4 - EST PT ICD-10 Diagnosis Description E11.622 Type 2 diabetes mellitus with other skin ulcer  E11.621 Type 2 diabetes mellitus with foot ulcer 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

## 2017-06-07 NOTE — Progress Notes (Signed)
LYNNAE, LUDEMANN ([id number]) Visit Report for 06/04/2017 Arrival Information Details Patient Name: Argueta, CAROLLEE NUSSBAUMER. Date of Service: 06/04/2017 10:15 AM Medical Record Number: [id number] Patient Account Number: [id number] Date of Birth/Sex: Mar 08, 1941 (76 y.o. Female) Treating RN: Cornell Barman Primary Care Bentleigh Stankus: Beverlyn Roux Other Clinician: Referring Aisia Correira: Beverlyn Roux Treating Jatziry Wechter/Extender: Melburn Hake, HOYT Weeks in Treatment: 30 Visit Information History Since Last Visit Added or deleted any medications: No Patient Arrived: Wheel Chair Any new allergies or adverse reactions: No Arrival Time: 10:00 Had a fall or experienced change in No Accompanied By: husband, activities of daily living that may affect [name] risk of falls: Transfer Assistance: None Signs or symptoms of abuse/neglect since last visito No Patient Identification Verified: Yes Hospitalized since last visit: No Secondary Verification Process Completed: Yes Has Dressing in Place as Prescribed: Yes Patient Requires Transmission-Based No Pain Present Now: No Precautions: Patient Has Alerts: No Electronic Signature(s) Signed: 06/05/2017 5:34:16 PM By: Gretta Cool, BSN, RN, CWS, Kim RN, BSN Entered By: Gretta Cool, BSN, RN, CWS, Kim on 06/04/2017 10:00:55 Alsobrook, [name] ([id number]) -------------------------------------------------------------------------------- Clinic Level of Care Assessment Details Patient Name: Koors, Madonna V. Date of Service: 06/04/2017 10:15 AM Medical Record Number: [id number] Patient Account Number: [id number] Date of Birth/Sex: [birth year] (76 y.o. Female) Treating RN: Cornell Barman Primary Care Vaishnav Demartin: Beverlyn Roux Other Clinician: Referring Bethani Brugger: Beverlyn Roux Treating Sadey Yandell/Extender: Melburn Hake, HOYT Weeks in Treatment: 30 Clinic Level of Care Assessment Items TOOL 4 Quantity Score []  - Use when only an EandM is performed on FOLLOW-UP visit 0 ASSESSMENTS - Nursing Assessment /  Reassessment []  - Reassessment of Co-morbidities (includes updates in patient status) 0 X- 1 5 Reassessment of Adherence to Treatment Plan ASSESSMENTS - Wound and Skin Assessment / Reassessment X - Simple Wound Assessment / Reassessment - one wound 1 5 []  - 0 Complex Wound Assessment / Reassessment - multiple wounds []  - 0 Dermatologic / Skin Assessment (not related to wound area) ASSESSMENTS - Focused Assessment []  - Circumferential Edema Measurements - multi extremities 0 []  - 0 Nutritional Assessment / Counseling / Intervention []  - 0 Lower Extremity Assessment (monofilament, tuning fork, pulses) []  - 0 Peripheral Arterial Disease Assessment (using hand held doppler) ASSESSMENTS - Ostomy and/or Continence Assessment and Care []  - Incontinence Assessment and Management 0 []  - 0 Ostomy Care Assessment and Management (repouching, etc.) PROCESS - Coordination of Care X - Simple Patient / Family Education for ongoing care 1 15 []  - 0 Complex (extensive) Patient / Family Education for ongoing care X- 1 10 Staff obtains Programmer, systems, Records, Test Results / Process Orders []  - 0 Staff telephones HHA, Nursing Homes / Clarify orders / etc []  - 0 Routine Transfer to another Facility (non-emergent condition) []  - 0 Routine Hospital Admission (non-emergent condition) []  - 0 New Admissions / Biomedical engineer / Ordering NPWT, Apligraf, etc. []  - 0 Emergency Hospital Admission (emergent condition) X- 1 10 Simple Discharge Coordination Mancino, Shakiara V. ([id number]) []  - 0 Complex (extensive) Discharge Coordination PROCESS - Special Needs []  - Pediatric / Minor Patient Management 0 []  - 0 Isolation Patient Management []  - 0 Hearing / Language / Visual special needs []  - 0 Assessment of Community assistance (transportation, D/C planning, etc.) []  - 0 Additional assistance / Altered mentation []  - 0 Support Surface(s) Assessment (bed, cushion, seat, etc.) INTERVENTIONS -  Wound Cleansing / Measurement X - Simple Wound Cleansing - one wound 1 5 []  - 0 Complex Wound Cleansing - multiple wounds X- 1 5 Wound Imaging (photographs -  any number of wounds) []  - 0 Wound Tracing (instead of photographs) X- 1 5 Simple Wound Measurement - one wound []  - 0 Complex Wound Measurement - multiple wounds INTERVENTIONS - Wound Dressings []  - Small Wound Dressing one or multiple wounds 0 []  - 0 Medium Wound Dressing one or multiple wounds X- 1 20 Large Wound Dressing one or multiple wounds []  - 0 Application of Medications - topical []  - 0 Application of Medications - injection INTERVENTIONS - Miscellaneous []  - External ear exam 0 []  - 0 Specimen Collection (cultures, biopsies, blood, body fluids, etc.) []  - 0 Specimen(s) / Culture(s) sent or taken to Lab for analysis []  - 0 Patient Transfer (multiple staff / Civil Service fast streamer / Similar devices) []  - 0 Simple Staple / Suture removal (25 or less) []  - 0 Complex Staple / Suture removal (26 or more) []  - 0 Hypo / Hyperglycemic Management (close monitor of Blood Glucose) []  - 0 Ankle / Brachial Index (ABI) - do not check if billed separately X- 1 5 Vital Signs Cashen, Khylie V. ([id number]) Has the patient been seen at the hospital within the last three years: Yes Total Score: 85 Level Of Care: New/Established - Level 3 Electronic Signature(s) Signed: 06/05/2017 5:34:16 PM By: Gretta Cool, BSN, RN, CWS, Kim RN, BSN Entered By: Gretta Cool, BSN, RN, CWS, Kim on 06/04/2017 10:31:14 Mccanless, [name] ([id number]) -------------------------------------------------------------------------------- Encounter Discharge Information Details Patient Name: Bovard, Edmonia V. Date of Service: 06/04/2017 10:15 AM Medical Record Number: [id number] Patient Account Number: [id number] Date of Birth/Sex: 1940/12/16 (76 y.o. Female) Treating RN: Cornell Barman Primary Care Nyaira Hodgens: Beverlyn Roux Other Clinician: Referring Emonii Wienke: Beverlyn Roux Treating  Monserat Prestigiacomo/Extender: Melburn Hake, HOYT Weeks in Treatment: 30 Encounter Discharge Information Items Discharge Pain Level: 0 Discharge Condition: Stable Ambulatory Status: Wheelchair Discharge Destination: Home Transportation: Private Auto Accompanied By: husband Schedule Follow-up Appointment: Yes Medication Reconciliation completed and Yes provided to Patient/Care Tacuma Graffam: Provided on Clinical Summary of Care: 06/04/2017 Form Type Recipient Paper Patient [facility] Electronic Signature(s) Signed: 06/05/2017 5:34:16 PM By: Gretta Cool, BSN, RN, CWS, Kim RN, BSN Entered By: Gretta Cool, BSN, RN, CWS, Kim on 06/04/2017 10:35:17 Kolenda, [name] ([id number]) -------------------------------------------------------------------------------- Lower Extremity Assessment Details Patient Name: Bole, Emmakate V. Date of Service: 06/04/2017 10:15 AM Medical Record Number: [id number] Patient Account Number: [id number] Date of Birth/Sex: 1941/01/03 (76 y.o. Female) Treating RN: Cornell Barman Primary Care Jassen Sarver: Beverlyn Roux Other Clinician: Referring Aurthur Wingerter: Beverlyn Roux Treating Constancia Geeting/Extender: Melburn Hake, HOYT Weeks in Treatment: 30 Edema Assessment Assessed: [Left: No] [Right: No] Edema: [Left: Ye] [Right: s] Vascular Assessment Claudication: Claudication Assessment [Right:Intermittent] Pulses: Dorsalis Pedis Palpable: [Right:Yes] Posterior Tibial Extremity colors, hair growth, and conditions: Extremity Color: [Right:Hyperpigmented] Hair Growth on Extremity: [Right:No] Temperature of Extremity: [Right:Cool] Capillary Refill: [Right:< 3 seconds] Toe Nail Assessment Left: Right: Thick: No Discolored: No Deformed: Yes Improper Length and Hygiene: Yes Electronic Signature(s) Signed: 06/05/2017 5:34:16 PM By: Gretta Cool, BSN, RN, CWS, Kim RN, BSN Entered By: Gretta Cool, BSN, RN, CWS, Kim on 06/04/2017 10:08:40 Flight, Kierrah [name]  ([id number]) -------------------------------------------------------------------------------- Multi Wound Chart Details Patient Name: Norment, Letta V. Date of Service: 06/04/2017 10:15 AM Medical Record Number: [id number] Patient Account Number: [id number] Date of Birth/Sex: 11/03/1940 (76 y.o. Female) Treating RN: Cornell Barman Primary Care Destani Wamser: Beverlyn Roux Other Clinician: Referring Enmanuel Zufall: Beverlyn Roux Treating Kendrix Orman/Extender: Melburn Hake, HOYT Weeks in Treatment: 30 Vital Signs Height(in): 64 Pulse(bpm): 75 Weight(lbs): 200 Blood Pressure(mmHg): 157/64 Body Mass Index(BMI): 34 Temperature(F): 98.1 Respiratory Rate 16 (breaths/min): Photos: [N/A:N/A] Wound Location: Right Metatarsal head  first - N/A N/A Dorsal Wounding Event: Gradually Appeared N/A N/A Primary Etiology: Diabetic Wound/Ulcer of the N/A N/A Lower Extremity Comorbid History: Cataracts, Chronic sinus N/A N/A problems/congestion, Congestive Heart Failure, Hypertension, Type II Diabetes Date Acquired: 08/06/2013 N/A N/A Weeks of Treatment: 30 N/A N/A Wound Status: Open N/A N/A Measurements L x W x D 2x0.8x0.3 N/A N/A (cm) Area (cm) : 1.257 N/A N/A Volume (cm) : 0.377 N/A N/A % Reduction in Area: -6.70% N/A N/A % Reduction in Volume: -6.80% N/A N/A Classification: Grade 2 N/A N/A Exudate Amount: Medium N/A N/A Exudate Type: Serous N/A N/A Exudate Color: amber N/A N/A Wound Margin: Flat and Intact N/A N/A Granulation Amount: Medium (34-66%) N/A N/A Granulation Quality: Red N/A N/A Necrotic Amount: Medium (34-66%) N/A N/A Exposed Structures: Fat Layer (Subcutaneous N/A N/A Tissue) Exposed: Yes Fascia: No Tendon: No Muscle: No Nauman, Savannaha V. ([id number]) Joint: No Bone: No Epithelialization: Small (1-33%) N/A N/A Periwound Skin Texture: Induration: Yes N/A N/A Excoriation: No Callus: No Crepitus: No Rash: No Scarring: No Periwound Skin Moisture: Maceration: No N/A N/A Dry/Scaly:  No Periwound Skin Color: Hemosiderin Staining: Yes N/A N/A Atrophie Blanche: No Cyanosis: No Ecchymosis: No Erythema: No Mottled: No Pallor: No Rubor: No Tenderness on Palpation: Yes N/A N/A Wound Preparation: Ulcer Cleansing: N/A N/A Rinsed/Irrigated with Saline Topical Anesthetic Applied: Other: lidocaine 4% Treatment Notes Electronic Signature(s) Signed: 06/05/2017 5:34:16 PM By: Gretta Cool, BSN, RN, CWS, Kim RN, BSN Entered By: Gretta Cool, BSN, RN, CWS, Kim on 06/04/2017 10:22:40 Miyoshi, [name] ([id number]) -------------------------------------------------------------------------------- Multi-Disciplinary Care Plan Details Patient Name: Ogle, Valery V. Date of Service: 06/04/2017 10:15 AM Medical Record Number: [id number] Patient Account Number: [id number] Date of Birth/Sex: 1941-03-28 (76 y.o. Female) Treating RN: Cornell Barman Primary Care Avaiah Stempel: Beverlyn Roux Other Clinician: Referring Lezlie Ritchey: Beverlyn Roux Treating Hoover Grewe/Extender: Melburn Hake, HOYT Weeks in Treatment: 30 Active Inactive ` Abuse / Safety / Falls / Self Care Management Nursing Diagnoses: Impaired physical mobility Potential for falls Goals: Patient will remain injury free Date Initiated: 11/06/2016 Target Resolution Date: 12/30/2016 Goal Status: Active Patient/caregiver will verbalize understanding of skin care regimen Date Initiated: 11/06/2016 Target Resolution Date: 12/30/2016 Goal Status: Active Interventions: Assess fall risk on admission and as needed Treatment Activities: Patient referred to home care : 11/06/2016 Notes: ` Nutrition Nursing Diagnoses: Potential for alteratiion in Nutrition/Potential for imbalanced nutrition Goals: Patient/caregiver verbalizes understanding of need to maintain therapeutic glucose control per primary care physician Date Initiated: 11/06/2016 Target Resolution Date: 12/30/2016 Goal Status: Active Interventions: Provide education on elevated blood sugars and impact on  wound healing Notes: ` Orientation to the Wound Care Program Nursing Diagnoses: Knowledge deficit related to the wound healing center program Cieslak, FLOREE ZUNIGA ([id number]) Goals: Patient/caregiver will verbalize understanding of the [place] Program Date Initiated: 11/06/2016 Target Resolution Date: 12/30/2016 Goal Status: Active Interventions: Provide education on orientation to the wound center Notes: ` Venous Leg Ulcer Nursing Diagnoses: Actual venous Insuffiency (use after diagnosis is confirmed) Knowledge deficit related to disease process and management Goals: Non-invasive venous studies are completed as ordered Date Initiated: 11/06/2016 Target Resolution Date: 12/30/2016 Goal Status: Active Patient/caregiver will verbalize understanding of disease process and disease management Date Initiated: 11/06/2016 Target Resolution Date: 12/30/2016 Goal Status: Active Interventions: Assess peripheral edema status every visit. Notes: ` Wound/Skin Impairment Nursing Diagnoses: Impaired tissue integrity Knowledge deficit related to smoking impact on wound healing Knowledge deficit related to ulceration/compromised skin integrity Goals: Ulcer/skin breakdown will heal within 14 weeks Date Initiated: 11/06/2016 Target Resolution Date:  02/05/2017 Goal Status: Active Interventions: Assess ulceration(s) every visit Treatment Activities: Skin care regimen initiated : 11/06/2016 Notes: Electronic Signature(s) Signed: 06/05/2017 5:34:16 PM By: Gretta Cool, BSN, RN, CWS, Kim RN, BSN Denis, Kinslie V. ([id number]) Entered By: Gretta Cool, BSN, RN, CWS, Kim on 06/04/2017 10:21:39 Seavey, [name] ([id number]) -------------------------------------------------------------------------------- Pain Assessment Details Patient Name: Winzer, Jon V. Date of Service: 06/04/2017 10:15 AM Medical Record Number: [id number] Patient Account Number: [id number] Date of Birth/Sex: 03-Mar-1941 (76 y.o. Female) Treating  RN: Cornell Barman Primary Care Demari Kropp: Beverlyn Roux Other Clinician: Referring Lewanna Petrak: Beverlyn Roux Treating Dessie Delcarlo/Extender: Melburn Hake, HOYT Weeks in Treatment: 30 Active Problems Location of Pain Severity and Description of Pain Patient Has Paino No Site Locations With Dressing Change: No Pain Management and Medication Current Pain Management: Goals for Pain Management Topical or injectable lidocaine is offered to patient for acute pain when surgical debridement is performed. If needed, Patient is instructed to use over the counter pain medication for the following 24-48 hours after debridement. Wound care MDs do not prescribed pain medications. Patient has chronic pain or uncontrolled pain. Patient has been instructed to make an appointment with their Primary Care Physician for pain management. Electronic Signature(s) Signed: 06/05/2017 5:34:16 PM By: Gretta Cool, BSN, RN, CWS, Kim RN, BSN Entered By: Gretta Cool, BSN, RN, CWS, Kim on 06/04/2017 10:01:21 Jarrells, [name] ([id number]) -------------------------------------------------------------------------------- Patient/Caregiver Education Details Patient Name: Kovacevic, Salley V. Date of Service: 06/04/2017 10:15 AM Medical Record Number: [id number] Patient Account Number: [id number] Date of Birth/Gender: [birth year] (76 y.o. Female) Treating RN: Cornell Barman Primary Care Physician: Beverlyn Roux Other Clinician: Referring Physician: Beverlyn Roux Treating Physician/Extender: Sharalyn Ink in Treatment: 30 Education Assessment Education Provided To: Patient and Caregiver Education Topics Provided Wound/Skin Impairment: Handouts: Caring for Your Ulcer, Other: continue wound care as prescribed Methods: Demonstration, Explain/Verbal Responses: State content correctly Electronic Signature(s) Signed: 06/05/2017 5:34:16 PM By: Gretta Cool, BSN, RN, CWS, Kim RN, BSN Entered By: Gretta Cool, BSN, RN, CWS, Kim on 06/04/2017 10:35:49 Rufener, [name]  ([id number]) -------------------------------------------------------------------------------- Wound Assessment Details Patient Name: Cuthbertson, Christe V. Date of Service: 06/04/2017 10:15 AM Medical Record Number: [id number] Patient Account Number: [id number] Date of Birth/Sex: Sep 30, 1940 (76 y.o. Female) Treating RN: Cornell Barman Primary Care Emarion Toral: Beverlyn Roux Other Clinician: Referring Dawn Convery: Beverlyn Roux Treating Ezariah Nace/Extender: Melburn Hake, HOYT Weeks in Treatment: 30 Wound Status Wound Number: 7 Primary Diabetic Wound/Ulcer of the Lower Extremity Etiology: Wound Location: Right Metatarsal head first - Dorsal Wound Open Wounding Event: Gradually Appeared Status: Date Acquired: 08/06/2013 Comorbid Cataracts, Chronic sinus problems/congestion, Weeks Of Treatment: 30 History: Congestive Heart Failure, Hypertension, Type II Clustered Wound: No Diabetes Photos Wound Measurements Length: (cm) 2 Width: (cm) 0.8 Depth: (cm) 0.3 Area: (cm) 1.257 Volume: (cm) 0.377 % Reduction in Area: -6.7% % Reduction in Volume: -6.8% Epithelialization: Small (1-33%) Tunneling: No Undermining: No Wound Description Classification: Grade 2 Wound Margin: Flat and Intact Exudate Amount: Medium Exudate Type: Serous Exudate Color: amber Foul Odor After Cleansing: No Slough/Fibrino Yes Wound Bed Granulation Amount: Medium (34-66%) Exposed Structure Granulation Quality: Red Fascia Exposed: No Necrotic Amount: Medium (34-66%) Fat Layer (Subcutaneous Tissue) Exposed: Yes Necrotic Quality: Adherent Slough Tendon Exposed: No Muscle Exposed: No Joint Exposed: No Bone Exposed: No Periwound Skin Texture Texture Color No Abnormalities Noted: No No Abnormalities Noted: No Callus: No Atrophie Blanche: No Tarleton, Gavyn V. ([id number]) Crepitus: No Cyanosis: No Excoriation: No Ecchymosis: No Induration: Yes Erythema: No Rash: No Hemosiderin Staining: Yes Scarring: No Mottled: No Pallor:  No Moisture Rubor: No No  Abnormalities Noted: No Dry / Scaly: No Temperature / Pain Maceration: No Tenderness on Palpation: Yes Wound Preparation Ulcer Cleansing: Rinsed/Irrigated with Saline Topical Anesthetic Applied: Other: lidocaine 4%, Treatment Notes Wound #7 (Right, Dorsal Metatarsal head first) 1. Cleansed with: Clean wound with Normal Saline 2. Anesthetic Topical Lidocaine 4% cream to wound bed prior to debridement 4. Dressing Applied: Hydrafera Blue 5. Secondary Dressing Applied ABD and Kerlix/Conform 7. Secured with Recruitment consultant) Signed: 06/05/2017 5:34:16 PM By: Gretta Cool, BSN, RN, CWS, Kim RN, BSN Entered By: Gretta Cool, BSN, RN, CWS, Kim on 06/04/2017 10:06:55 Hodak, [name] ([id number]) -------------------------------------------------------------------------------- Elim Details Patient Name: Droll, Zykera V. Date of Service: 06/04/2017 10:15 AM Medical Record Number: [id number] Patient Account Number: [id number] Date of Birth/Sex: Apr 15, 1941 (76 y.o. Female) Treating RN: Cornell Barman Primary Care Bindi Klomp: Beverlyn Roux Other Clinician: Referring Addysin Porco: Beverlyn Roux Treating Eero Dini/Extender: Melburn Hake, HOYT Weeks in Treatment: 30 Vital Signs Time Taken: 10:01 Temperature (F): 98.1 Height (in): 64 Pulse (bpm): 75 Weight (lbs): 200 Respiratory Rate (breaths/min): 16 Body Mass Index (BMI): 34.3 Blood Pressure (mmHg): 157/64 Reference Range: 80 - 120 mg / dl Electronic Signature(s) Signed: 06/05/2017 5:34:16 PM By: Gretta Cool, BSN, RN, CWS, Kim RN, BSN Entered By: Gretta Cool, BSN, RN, CWS, Kim on 06/04/2017 10:03:37

## 2017-06-11 ENCOUNTER — Encounter: Payer: Medicare HMO | Attending: Internal Medicine | Admitting: Internal Medicine

## 2017-06-11 DIAGNOSIS — Z880 Allergy status to penicillin: Secondary | ICD-10-CM | POA: Insufficient documentation

## 2017-06-11 DIAGNOSIS — M86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, right ankle and foot: Secondary | ICD-10-CM | POA: Insufficient documentation

## 2017-06-11 DIAGNOSIS — E785 Hyperlipidemia, unspecified: Secondary | ICD-10-CM | POA: Insufficient documentation

## 2017-06-11 DIAGNOSIS — E1152 Type 2 diabetes mellitus with diabetic peripheral angiopathy with gangrene: Secondary | ICD-10-CM | POA: Insufficient documentation

## 2017-06-11 DIAGNOSIS — E11621 Type 2 diabetes mellitus with foot ulcer: Secondary | ICD-10-CM | POA: Insufficient documentation

## 2017-06-11 DIAGNOSIS — L97219 Non-pressure chronic ulcer of right calf with unspecified severity: Secondary | ICD-10-CM | POA: Diagnosis not present

## 2017-06-11 DIAGNOSIS — I70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 of foot: Secondary | ICD-10-CM | POA: Diagnosis not present

## 2017-06-11 DIAGNOSIS — I350 Nonrheumatic aortic (valve) stenosis: Secondary | ICD-10-CM | POA: Diagnosis not present

## 2017-06-11 DIAGNOSIS — Z882 Allergy status to sulfonamides status: Secondary | ICD-10-CM | POA: Insufficient documentation

## 2017-06-11 DIAGNOSIS — Z888 Allergy status to other drugs, medicaments and biological substances status: Secondary | ICD-10-CM | POA: Insufficient documentation

## 2017-06-11 DIAGNOSIS — D649 Anemia, unspecified: Secondary | ICD-10-CM | POA: Diagnosis not present

## 2017-06-11 DIAGNOSIS — Z91012 Allergy to eggs: Secondary | ICD-10-CM | POA: Diagnosis not present

## 2017-06-11 DIAGNOSIS — Z91013 Allergy to seafood: Secondary | ICD-10-CM | POA: Diagnosis not present

## 2017-06-11 DIAGNOSIS — I110 Hypertensive heart disease with heart failure: Secondary | ICD-10-CM | POA: Diagnosis not present

## 2017-06-11 DIAGNOSIS — Z8673 Personal history of transient ischemic attack (TIA), and cerebral infarction without residual deficits: Secondary | ICD-10-CM | POA: Insufficient documentation

## 2017-06-11 DIAGNOSIS — L97519 Non-pressure chronic ulcer of other part of right foot with unspecified severity: Secondary | ICD-10-CM | POA: Diagnosis not present

## 2017-06-11 DIAGNOSIS — I70232 Atherosclerosis 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 of calf: Secondary | ICD-10-CM | POA: Insufficient documentation

## 2017-06-11 DIAGNOSIS — I509 Heart failure, unspecified: Secondary | ICD-10-CM | POA: Insufficient documentation

## 2017-06-11 DIAGNOSIS — E11622 Type 2 diabetes mellitus with other skin ulcer: Secondary | ICD-10-CM | POA: Insufficient documentation

## 2017-06-11 DIAGNOSIS — Z794 Long term (current) use of insulin: Secondary | ICD-10-CM | POA: Diagnosis not present

## 2017-06-11 DIAGNOSIS — E43 Unspecified severe protein-calorie malnutrition: Secondary | ICD-10-CM | POA: Diagnosis not present

## 2017-06-13 ENCOUNTER — Ambulatory Visit (INDEPENDENT_AMBULATORY_CARE_PROVIDER_SITE_OTHER)
Admission: RE | Admit: 2017-06-13 | Discharge: 2017-06-13 | Disposition: A | Discharge status: Home / Self Care | Payer: Medicare HMO | Source: Ambulatory Visit | Attending: Vascular Surgery | Admitting: Vascular Surgery

## 2017-06-13 ENCOUNTER — Ambulatory Visit (HOSPITAL_COMMUNITY)
Admission: RE | Admit: 2017-06-13 | Discharge: 2017-06-13 | Disposition: A | Discharge status: Home / Self Care | Payer: Medicare HMO | Source: Ambulatory Visit | Attending: Vascular Surgery | Admitting: Vascular Surgery

## 2017-06-13 ENCOUNTER — Encounter: Payer: Self-pay | Admitting: Vascular Surgery

## 2017-06-13 ENCOUNTER — Ambulatory Visit (INDEPENDENT_AMBULATORY_CARE_PROVIDER_SITE_OTHER): Payer: Medicare HMO | Admitting: Vascular Surgery

## 2017-06-13 VITALS — BP 126/69 | HR 77 | Temp 97.1°F | Resp 20 | Ht 64.0 in | Wt 217.0 lb

## 2017-06-13 DIAGNOSIS — I998 Other disorder of circulatory system: Secondary | ICD-10-CM | POA: Diagnosis not present

## 2017-06-13 DIAGNOSIS — I739 Peripheral vascular disease, unspecified: Secondary | ICD-10-CM | POA: Insufficient documentation

## 2017-06-13 DIAGNOSIS — I70229 Atherosclerosis of native arteries of extremities with rest pain, unspecified extremity: Secondary | ICD-10-CM

## 2017-06-13 NOTE — Progress Notes (Signed)
Established Bypass  History of Present Illness  [name] is a 76 y.o. (1941/04/02) female who presents cc: non-healing R foot wound.  Prior procedures include: 1.  Right below-the-knee popliteal artery to peroneal artery bypass with reversed greater saphenous vein  2.  Endarterectomy of mid-segment peroneal artery(Date: 02/28/16).   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  This patient has been lost to follow up recent due to multiple bouts of illness requiring admission.    Family feels the right foot wound is healing.  Wound care is concerned that the wound is not healing.  PMH, PSH, SH, and [place] unchanged from 10/25/16.   Current Outpatient Medications  Medication Sig Dispense Refill  . acetaminophen (TYLENOL) 325 MG tablet Take 2 tablets (650 mg total) by mouth every 6 (six) hours as needed for moderate pain or fever. 30 tablet 1  . albuterol (PROVENTIL HFA;VENTOLIN HFA) 108 (90 Base) MCG/ACT inhaler Inhale 2 puffs into the lungs every 6 (six) hours as needed.    [name] amLODipine (NORVASC) 10 MG tablet Take 1 tablet (10 mg total) by mouth daily. 30 tablet 3  . aspirin 325 MG tablet Take 1 tablet (325 mg total) by mouth daily with breakfast. (Patient taking differently: Take 81 mg by mouth daily with breakfast. ) 30 tablet 0  . atorvastatin (LIPITOR) 20 MG tablet Take 1 tablet (20 mg total) by mouth daily. 30 tablet 11  . B-D ULTRAFINE III SHORT PEN 31G X 8 MM MISC     . carvedilol (COREG) 12.5 MG tablet Take 12.5 mg by mouth 2 (two) times daily with a meal.    . cetirizine (ZYRTEC) 10 MG tablet Take 10 mg by mouth daily.     . Cholecalciferol (VITAMIN D3) 1000 units CAPS Take by mouth.    . collagenase (SANTYL) ointment Apply topically 2 (two) times daily. Apply to dorsum right foot wound  daily. 15 g 0  . docusate sodium (COLACE) 100 MG capsule Take 100 mg by mouth 2 (two) times daily.    [name] DOXYCYCLINE PO Take 100 mg 2 (two) times daily by mouth.    . fluticasone (FLONASE) 50 MCG/ACT nasal spray Place 1 spray into both nostrils daily.    . insulin aspart (NOVOLOG) 100 UNIT/ML injection Inject 0-15 Units into the skin 3 (three) times daily with meals. Before each meal 3 times a day, 140-199 - 3 units, 200-250 - 5 units, 251-299 - 8 units,  300-349 - 12 units,  350 or above 15 units. (Patient taking differently: Inject 12 Units into the skin 3 (three) times daily with meals. ) 10 mL 11  . insulin glargine (LANTUS) 100 UNIT/ML injection Inject 0.27 mLs (27 Units total) into the skin at bedtime. 10 mL 11  . isosorbide dinitrate (ISORDIL) 30 MG tablet     . isosorbide mononitrate (IMDUR) 60 MG 24 hr tablet Take 1 tablet (60 mg total) by mouth daily. 30 tablet 0  . LANTUS SOLOSTAR 100 UNIT/ML Solostar Pen     . losartan (COZAAR) 100 MG tablet Take 100 mg by mouth daily.    . metoprolol (LOPRESSOR) 50 MG tablet Take 50 mg by mouth 2 (two) times daily.    . metoprolol succinate (TOPROL-XL) 50 MG 24 hr tablet Take 50 mg by  mouth daily. Take with or immediately following a meal.    . nitroGLYCERIN (NITROSTAT) 0.4 MG SL tablet Place 1 tablet (0.4 mg total) under the tongue every 5 (five) minutes as needed for chest pain. 3 tablet 12  . omeprazole (PRILOSEC) 20 MG capsule Take 20 mg by mouth daily.    . polyethylene glycol (MIRALAX / GLYCOLAX) packet Take 17 g by mouth 2 (two) times daily. (Patient taking differently: Take 17 g by mouth daily as needed for mild constipation. ) 14 each 0  . polyethylene glycol powder (GLYCOLAX/MIRALAX) powder     . potassium chloride SA (K-DUR,KLOR-CON) 20 MEQ tablet Take 20 mEq by mouth daily.    . pregabalin (LYRICA) 50 MG capsule Take 50 mg by mouth 3 (three) times daily.    . ranolazine (RANEXA) 500 MG 12 hr tablet Take 1 tablet (500 mg total) by mouth 2  (two) times daily. 60 tablet 0  . ranolazine (RANEXA) 500 MG 12 hr tablet Take by mouth.    . torsemide (DEMADEX) 20 MG tablet     . VITAMIN E PO Take 1 capsule by mouth daily.    [name] aspirin 325 MG tablet Take by mouth.    . carvedilol (COREG) 12.5 MG tablet Take by mouth.    . carvedilol (COREG) 6.25 MG tablet     . cholecalciferol (VITAMIN D) 1000 units tablet Take 1,000 Units by mouth daily.    . clopidogrel (PLAVIX) 75 MG tablet Take 1 tablet (75 mg total) by mouth daily with breakfast. 30 tablet 0  . clopidogrel (PLAVIX) 75 MG tablet Take by mouth.    . furosemide (LASIX) 40 MG tablet Take 1 tablet (40 mg total) by mouth 2 (two) times daily. 30 tablet 1  . pregabalin (LYRICA) 50 MG capsule Take by mouth.     No current facility-administered medications for this visit.     On ROS: swelling in R leg, no rest pain   Physical Examination  Vitals:   06/13/17 1043  BP: 126/69  Pulse: 77  Resp: 20  Temp: (!) 97.1 F (36.2 C)  TempSrc: Oral  SpO2: 95%  Weight: 217 lb (98.4 kg)  Height: 5\' 4"  (1.626 m)   Body mass index is 37.25 kg/m.   General: A&O x 3, Ill appearing, more alert than usual  Pulmonary: Sym exp, good B air movt, CTA B  Cardiac: RRR, Nl S1, S2, no Murmurs, No rubs, No S3,S4  Vascular: Vessel Right Left  Radial Faintly palpable Faintly palpable  Brachial Palpable Palpable  Carotid Palpable Palpable  Aorta Not palpable due to pannus N/A  Femoral Not palpable in wheelchair Not palpable in wheelchair  Popliteal Not palpable Not palpable  PT Not palpable Not palpable  DP Not palpable Not palpable   Gastrointestinal: soft, non-distended, non-tender to palpation, No guarding or rebound, no HSM, no masses, no CVAT B, No palpable prominent aortic pulse,    Musculoskeletal: in wheelchair, R toes look better perfused today, R 1st MT ulcer with granulation, clean appearance, contracted 50% from prior visit, RLE 2-3+, LLE edema 2+, calf wounds  healed  Neurologic: Pain and light touch decreased in both feet, Motor exam as listed above   Non-invasive Vascular Imaging   ABI (06/13/2017)  R:   ABI: 0.62 (0.48),   PT: dampened mono  DP: mono  TBI:  ND  L:   ABI: 0.89 (0.63),   PT: mono  DP: mono  TBI: ND  RLE arterial duplex (06/13/2017)  Difficult exam  Monophasic flow into proximal graft,   Mid and distal not visualized    Medical Decision Making   [name] is a 76 y.o. 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  Prior MRI demonstrated no osteomyelitis.  No hardware has been seen on previous hardware. I offered R leg runoff, possible intervention.  Patient refused. Given the severity of this patient's current functional limitation, I don't think I would consider any further surgical intervention.  I also would not offer any additional surgery without angiography. The patient is currently on a statin: Lipitor.  The patient is currently on an anti-platelet: ASA.  Will continue with q6 month BLE ABI and RLE arterial duplex. Continue wound care.   Adele Barthel, MD, FACS [facility] Office: [phone] Pager: [phone]

## 2017-06-13 NOTE — Progress Notes (Signed)
[name], [name] ([id number]) Visit Report for 06/11/2017 Arrival Information Details Patient Name: [name], [name] AHART. Date of Service: 06/11/2017 12:45 PM Medical Record Number: [id number] Patient Account Number: [id number] Date of Birth/Sex: [birth year] (76 y.o. Female) Treating RN: Roger Shelter Primary Care Halo Shevlin: Beverlyn Roux Other Clinician: Referring Arrabella Westerman: Beverlyn Roux Treating Deziyah Arvin/Extender: Tito Dine in Treatment: 61 Visit Information History Since Last Visit All ordered tests and consults were completed: No Patient Arrived: Wheel Chair Added or deleted any medications: No Arrival Time: 12:39 Any new allergies or adverse reactions: No Accompanied By: husband Had a fall or experienced change in No activities of daily living that may affect Transfer Assistance: None risk of falls: Patient Identification Verified: Yes Signs or symptoms of abuse/neglect since last visito No Secondary Verification Process Completed: Yes Hospitalized since last visit: No Patient Requires Transmission-Based No Pain Present Now: Yes Precautions: Patient Has Alerts: No Electronic Signature(s) Signed: 06/11/2017 4:21:40 PM By: Roger Shelter Entered By: Roger Shelter on 06/11/2017 12:43:39 [name], [name] ([id number]) -------------------------------------------------------------------------------- Clinic Level of Care Assessment Details Patient Name: [name], Akilah V. Date of Service: 06/11/2017 12:45 PM Medical Record Number: [id number] Patient Account Number: [id number] Date of Birth/Sex: [birth year] (76 y.o. Female) Treating RN: Roger Shelter Primary Care Stana Bayon: Beverlyn Roux Other Clinician: Referring Stefhanie Kachmar: Beverlyn Roux Treating Tonishia Steffy/Extender: Tito Dine in Treatment: 31 Clinic Level of Care Assessment Items TOOL 4 Quantity Score []  - Use when only an EandM is performed on FOLLOW-UP visit 0 ASSESSMENTS - Nursing Assessment / Reassessment []  -  Reassessment of Co-morbidities (includes updates in patient status) 0 X- 1 5 Reassessment of Adherence to Treatment Plan ASSESSMENTS - Wound and Skin Assessment / Reassessment X - Simple Wound Assessment / Reassessment - one wound 1 5 []  - 0 Complex Wound Assessment / Reassessment - multiple wounds []  - 0 Dermatologic / Skin Assessment (not related to wound area) ASSESSMENTS - Focused Assessment []  - Circumferential Edema Measurements - multi extremities 0 []  - 0 Nutritional Assessment / Counseling / Intervention []  - 0 Lower Extremity Assessment (monofilament, tuning fork, pulses) []  - 0 Peripheral Arterial Disease Assessment (using hand held doppler) ASSESSMENTS - Ostomy and/or Continence Assessment and Care []  - Incontinence Assessment and Management 0 []  - 0 Ostomy Care Assessment and Management (repouching, etc.) PROCESS - Coordination of Care X - Simple Patient / Family Education for ongoing care 1 15 []  - 0 Complex (extensive) Patient / Family Education for ongoing care X- 1 10 Staff obtains Programmer, systems, Records, Test Results / Process Orders []  - 0 Staff telephones HHA, Nursing Homes / Clarify orders / etc []  - 0 Routine Transfer to another Facility (non-emergent condition) []  - 0 Routine Hospital Admission (non-emergent condition) []  - 0 New Admissions / Biomedical engineer / Ordering NPWT, Apligraf, etc. []  - 0 Emergency Hospital Admission (emergent condition) X- 1 10 Simple Discharge Coordination [name], Ariele V. ([id number]) []  - 0 Complex (extensive) Discharge Coordination PROCESS - Special Needs []  - Pediatric / Minor Patient Management 0 []  - 0 Isolation Patient Management []  - 0 Hearing / Language / Visual special needs []  - 0 Assessment of Community assistance (transportation, D/C planning, etc.) []  - 0 Additional assistance / Altered mentation []  - 0 Support Surface(s) Assessment (bed, cushion, seat, etc.) INTERVENTIONS - Wound Cleansing /  Measurement X - Simple Wound Cleansing - one wound 1 5 []  - 0 Complex Wound Cleansing - multiple wounds X- 1 5 Wound Imaging (photographs - any number of wounds) []  - 0  Wound Tracing (instead of photographs) X- 1 5 Simple Wound Measurement - one wound []  - 0 Complex Wound Measurement - multiple wounds INTERVENTIONS - Wound Dressings []  - Small Wound Dressing one or multiple wounds 0 X- 1 15 Medium Wound Dressing one or multiple wounds []  - 0 Large Wound Dressing one or multiple wounds []  - 0 Application of Medications - topical []  - 0 Application of Medications - injection INTERVENTIONS - Miscellaneous []  - External ear exam 0 []  - 0 Specimen Collection (cultures, biopsies, blood, body fluids, etc.) []  - 0 Specimen(s) / Culture(s) sent or taken to Lab for analysis []  - 0 Patient Transfer (multiple staff / Civil Service fast streamer / Similar devices) []  - 0 Simple Staple / Suture removal (25 or less) []  - 0 Complex Staple / Suture removal (26 or more) []  - 0 Hypo / Hyperglycemic Management (close monitor of Blood Glucose) []  - 0 Ankle / Brachial Index (ABI) - do not check if billed separately X- 1 5 Vital Signs [name], [name] ([id number]) Has the patient been seen at the hospital within the last three years: Yes Total Score: 80 Level Of Care: New/Established - Level 3 Electronic Signature(s) Signed: 06/13/2017 8:11:57 AM By: Roger Shelter Previous Signature: 06/11/2017 4:21:40 PM Version By: Roger Shelter Entered By: Roger Shelter on 06/12/2017 15:28:40 [name] ([id number]) -------------------------------------------------------------------------------- Encounter Discharge Information Details Patient Name: [name], Aashna V. Date of Service: 06/11/2017 12:45 PM Medical Record Number: [id number] Patient Account Number: [id number] Date of Birth/Sex: [birth year] (76 y.o. Female) Treating RN: Cornell Barman Primary Care [name]: Beverlyn Roux Other Clinician: Referring  Talley Kreiser: Beverlyn Roux Treating Saachi Zale/Extender: Tito Dine in Treatment: 31 Encounter Discharge Information Items Schedule Follow-up Appointment: No Medication Reconciliation completed and No provided to Patient/Care Kenzy Campoverde: Provided on Clinical Summary of Care: 06/11/2017 Form Type Recipient Paper Patient [facility] Electronic Signature(s) Signed: 06/12/2017 10:05:11 AM By: Ruthine Dose Entered By: Ruthine Dose on 06/11/2017 13:34:57 [name], [name] ([id number]) -------------------------------------------------------------------------------- Lower Extremity Assessment Details Patient Name: Lupi, Kamrie V. Date of Service: 06/11/2017 12:45 PM Medical Record Number: [id number] Patient Account Number: [id number] Date of Birth/Sex: 1941-06-05 (76 y.o. Female) Treating RN: Roger Shelter Primary Care Donato Studley: Beverlyn Roux Other Clinician: Referring Kailia Starry: Beverlyn Roux Treating Marvens Hollars/Extender: Tito Dine in Treatment: 31 Vascular Assessment Pulses: Dorsalis Pedis Palpable: [Right:No] Doppler Audible: [Right:Yes] Posterior Tibial Palpable: [Right:Yes] Extremity colors, hair growth, and conditions: Extremity Color: [Right:Hyperpigmented] Hair Growth on Extremity: [Right:No] Temperature of Extremity: [Right:Warm] Capillary Refill: [Right:> 3 seconds] Toe Nail Assessment Left: Right: Thick: Yes Discolored: Yes Deformed: Yes Improper Length and Hygiene: Yes Electronic Signature(s) Signed: 06/11/2017 4:21:40 PM By: Roger Shelter Entered By: Roger Shelter on 06/11/2017 12:59:05 Cassetta, Irean V. ([id number]) -------------------------------------------------------------------------------- Multi Wound Chart Details Patient Name: Batta, Adalis V. Date of Service: 06/11/2017 12:45 PM Medical Record Number: [id number] Patient Account Number: [id number] Date of Birth/Sex: [birth year] (76 y.o. Female) Treating RN: Roger Shelter Primary Care Madisynn Plair:  Beverlyn Roux Other Clinician: Referring Shadai Mcclane: Beverlyn Roux Treating Topanga Alvelo/Extender: Tito Dine in Treatment: 31 Vital Signs Height(in): 64 Pulse(bpm): 92 Weight(lbs): 200 Blood Pressure(mmHg): 155/70 Body Mass Index(BMI): 34 Temperature(F): 98.7 Respiratory Rate 16 (breaths/min): Photos: [N/A:N/A] Wound Location: Right Metatarsal head first - N/A N/A Dorsal Wounding Event: Gradually Appeared N/A N/A Primary Etiology: Diabetic Wound/Ulcer of the N/A N/A Lower Extremity Comorbid History: Cataracts, Chronic sinus N/A N/A problems/congestion, Congestive Heart Failure, Hypertension, Type II Diabetes Date Acquired: 08/06/2013 N/A N/A Weeks of Treatment: 31 N/A N/A Wound Status: Open N/A N/A  Measurements L x W x D 2x0.6x0.2 N/A N/A (cm) Area (cm) : 0.942 N/A N/A Volume (cm) : 0.188 N/A N/A % Reduction in Area: 20.00% N/A N/A % Reduction in Volume: 46.70% N/A N/A Classification: Grade 2 N/A N/A Exudate Amount: Medium N/A N/A Exudate Type: Serous N/A N/A Exudate Color: amber N/A N/A Wound Margin: Flat and Intact N/A N/A Granulation Amount: Medium (34-66%) N/A N/A Granulation Quality: Red N/A N/A Necrotic Amount: Medium (34-66%) N/A N/A Exposed Structures: Fat Layer (Subcutaneous N/A N/A Tissue) Exposed: Yes Fascia: No Tendon: No Muscle: No [name], [name] ([id number]) Joint: No Bone: No Epithelialization: Small (1-33%) N/A N/A Periwound Skin Texture: Induration: Yes N/A N/A Excoriation: No Callus: No Crepitus: No Rash: No Scarring: No Periwound Skin Moisture: Maceration: No N/A N/A Dry/Scaly: No Periwound Skin Color: Hemosiderin Staining: Yes N/A N/A Atrophie Blanche: No Cyanosis: No Ecchymosis: No Erythema: No Mottled: No Pallor: No Rubor: No Tenderness on Palpation: Yes N/A N/A Wound Preparation: Ulcer Cleansing: N/A N/A Rinsed/Irrigated with Saline Topical Anesthetic Applied: Other: lidocaine 4% Treatment Notes Electronic  Signature(s) Signed: 06/11/2017 5:39:07 PM By: Linton Ham MD Previous Signature: 06/11/2017 4:21:40 PM Version By: Roger Shelter Entered By: Linton Ham on 06/11/2017 17:24:53 Pinho, Deshon [name] ([id number]) -------------------------------------------------------------------------------- Multi-Disciplinary Care Plan Details Patient Name: [name], [name] Date of Service: 06/11/2017 12:45 PM Medical Record Number: [id number] Patient Account Number: [id number] Date of Birth/Sex: 1941/07/06 (76 y.o. Female) Treating RN: Roger Shelter Primary Care Gamble Enderle: Beverlyn Roux Other Clinician: Referring Zaina Jenkin: Beverlyn Roux Treating Riaan Toledo/Extender: Tito Dine in Treatment: 31 Active Inactive ` Abuse / Safety / Falls / Self Care Management Nursing Diagnoses: Impaired physical mobility Potential for falls Goals: Patient will remain injury free Date Initiated: 11/06/2016 Target Resolution Date: 12/30/2016 Goal Status: Active Patient/caregiver will verbalize understanding of skin care regimen Date Initiated: 11/06/2016 Target Resolution Date: 12/30/2016 Goal Status: Active Interventions: Assess fall risk on admission and as needed Treatment Activities: Patient referred to home care : 11/06/2016 Notes: ` Nutrition Nursing Diagnoses: Potential for alteratiion in Nutrition/Potential for imbalanced nutrition Goals: Patient/caregiver verbalizes understanding of need to maintain therapeutic glucose control per primary care physician Date Initiated: 11/06/2016 Target Resolution Date: 12/30/2016 Goal Status: Active Interventions: Provide education on elevated blood sugars and impact on wound healing Notes: ` Orientation to the Wound Care Program Nursing Diagnoses: Knowledge deficit related to the wound healing center program [name], [name] ANTILLA ([id number]) Goals: Patient/caregiver will verbalize understanding of the [place] Date Initiated: 11/06/2016 Target  Resolution Date: 12/30/2016 Goal Status: Active Interventions: Provide education on orientation to the wound center Notes: ` Venous Leg Ulcer Nursing Diagnoses: Actual venous Insuffiency (use after diagnosis is confirmed) Knowledge deficit related to disease process and management Goals: Non-invasive venous studies are completed as ordered Date Initiated: 11/06/2016 Target Resolution Date: 12/30/2016 Goal Status: Active Patient/caregiver will verbalize understanding of disease process and disease management Date Initiated: 11/06/2016 Target Resolution Date: 12/30/2016 Goal Status: Active Interventions: Assess peripheral edema status every visit. Notes: ` Wound/Skin Impairment Nursing Diagnoses: Impaired tissue integrity Knowledge deficit related to smoking impact on wound healing Knowledge deficit related to ulceration/compromised skin integrity Goals: Ulcer/skin breakdown will heal within 14 weeks Date Initiated: 11/06/2016 Target Resolution Date: 02/05/2017 Goal Status: Active Interventions: Assess ulceration(s) every visit Treatment Activities: Skin care regimen initiated : 11/06/2016 Notes: Electronic Signature(s) Signed: 06/11/2017 4:21:40 PM By: Denzil Hughes ([id number]) Entered By: Roger Shelter on 06/11/2017 13:00:00 Lachapelle, Latika V. ([id number]) -------------------------------------------------------------------------------- Pain Assessment Details Patient Name: [name], [name]  V. Date of Service: 06/11/2017 12:45 PM Medical Record Number: [id number] Patient Account Number: [id number] Date of Birth/Sex: Mar 16, 1941 (76 y.o. Female) Treating RN: Roger Shelter Primary Care Stephaine Breshears: Beverlyn Roux Other Clinician: Referring Irja Wheless: Beverlyn Roux Treating Chelby Salata/Extender: Tito Dine in Treatment: 31 Active Problems Location of Pain Severity and Description of Pain Patient Has Paino Yes Site Locations Pain Location: Pain in  Ulcers Duration of the Pain. Constant / Intermittento Intermittent Rate the pain. Current Pain Level: 6 Character of Pain Describe the Pain: Dull, Throbbing Pain Management and Medication Current Pain Management: Electronic Signature(s) Signed: 06/11/2017 4:21:40 PM By: Roger Shelter Entered By: Roger Shelter on 06/11/2017 12:46:00 [name], [name] ([id number]) -------------------------------------------------------------------------------- Wound Assessment Details Patient Name: [name], [name] Date of Service: 06/11/2017 12:45 PM Medical Record Number: [id number] Patient Account Number: [id number] Date of Birth/Sex: Aug 01, 1941 (76 y.o. Female) Treating RN: Roger Shelter Primary Care Ahmarion Saraceno: Beverlyn Roux Other Clinician: Referring Journey [place]: Beverlyn Roux Treating Audryna Wendt/Extender: Tito Dine in Treatment: 31 Wound Status Wound Number: 7 Primary Diabetic Wound/Ulcer of the Lower Extremity Etiology: Wound Location: Right Metatarsal head first - Dorsal Wound Open Wounding Event: Gradually Appeared Status: Date Acquired: 08/06/2013 Comorbid Cataracts, Chronic sinus problems/congestion, Weeks Of Treatment: 31 History: Congestive Heart Failure, Hypertension, Type II Clustered Wound: No Diabetes Photos Photo Uploaded By: Gretta Cool, BSN, RN, CWS, Kim on 06/11/2017 16:11:18 Wound Measurements Length: (cm) 2 Width: (cm) 0.6 Depth: (cm) 0.2 Area: (cm) 0.942 Volume: (cm) 0.188 % Reduction in Area: 20% % Reduction in Volume: 46.7% Epithelialization: Small (1-33%) Tunneling: No Undermining: No Wound Description Classification: Grade 2 Wound Margin: Flat and Intact Exudate Amount: Medium Exudate Type: Serous Exudate Color: amber Foul Odor After Cleansing: No Slough/Fibrino Yes Wound Bed Granulation Amount: Medium (34-66%) Exposed Structure Granulation Quality: Red Fascia Exposed: No Necrotic Amount: Medium (34-66%) Fat Layer (Subcutaneous Tissue)  Exposed: Yes Necrotic Quality: Adherent Slough Tendon Exposed: No Muscle Exposed: No Joint Exposed: No Bone Exposed: No Periwound Skin Texture Texture Color No Abnormalities Noted: No No Abnormalities Noted: No [name], [name] ([id number]) Callus: No Atrophie Blanche: No Crepitus: No Cyanosis: No Excoriation: No Ecchymosis: No Induration: Yes Erythema: No Rash: No Hemosiderin Staining: Yes Scarring: No Mottled: No Pallor: No Moisture Rubor: No No Abnormalities Noted: No Dry / Scaly: No Temperature / Pain Maceration: No Tenderness on Palpation: Yes Wound Preparation Ulcer Cleansing: Rinsed/Irrigated with Saline Topical Anesthetic Applied: Other: lidocaine 4%, Electronic Signature(s) Signed: 06/11/2017 4:21:40 PM By: Roger Shelter Entered By: Roger Shelter on 06/11/2017 12:55:47 [place], [place] ([id number]) -------------------------------------------------------------------------------- Vitals Details Patient Name: Condon, Tiondra V. Date of Service: 06/11/2017 12:45 PM Medical Record Number: [id number] Patient Account Number: [id number] Date of Birth/Sex: 02/13/1941 (76 y.o. Female) Treating RN: Roger Shelter Primary Care Lashana Spang: Beverlyn Roux Other Clinician: Referring Charmayne Odell: Beverlyn Roux Treating Havanna Groner/Extender: Tito Dine in Treatment: 31 Vital Signs Time Taken: 12:47 Temperature (F): 98.7 Height (in): 64 Pulse (bpm): 76 Weight (lbs): 200 Respiratory Rate (breaths/min): 16 Body Mass Index (BMI): 34.3 Blood Pressure (mmHg): 155/70 Reference Range: 80 - 120 mg / dl Electronic Signature(s) Signed: 06/11/2017 4:21:40 PM By: Roger Shelter Entered By: Roger Shelter on 06/11/2017 12:50:03

## 2017-06-13 NOTE — Progress Notes (Signed)
[name], [name] ([id number]) Visit Report for 06/11/2017 HPI Details Patient Name: [name], [name] Date of Service: 06/11/2017 12:45 PM Medical Record Number: [id number] Patient Account Number: [id number] Date of Birth/Sex: 10/03/1940 (76 y.o. Female) Treating RN: [name] Primary Care Provider: Beverlyn [name] Other Clinician: Referring Provider: Beverlyn [name] Treating Provider/Extender: [name] in Treatment: 31 History of Present Illness HPI Description: 08/13/16: This is a 76 year old woman who came predominantly for review of 3 cm in diameter circular wound to the left anterior lateral leg. She was in the ER on 08/01/16 I reviewed their notes. There was apparently pus coming out of the wound at that time and the patient arrived requesting debridement which they don't do in the emergency room. Nevertheless I can't see that they did any x-rays. There were no cultures done. She is a type II diabetic and I a note after the patient was in the clinic that she had a bypass graft from the popliteal to the tibial on the right on 02/28/16. She also had a right greater saphenous vein harvest on the same date for arterial bypass. She is going to have vascular studies including ABIs T ABIs on the right on 08/28/16. The patient's surgery was on 02/28/16 by [name] she had a right below the knee popliteal artery to peroneal artery bypass with reverse greater saphenous vein and an endarterectomy of the mid segment peroneal artery. Postoperatively she had a strong mild monophasic peroneal signal with a pink foot. It would appear that the patient is had some nonhealing in the surgical saphenous vein harvest site on the left leg. Surprisingly looking [facility] I cannot see much information about this at all. [name] notes from 05/29/16 show that the patient's wounds "are not healed" the right first metatarsal wound healed but then opened back up. 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 At that point the patient had a wound VAC to her right calf with regards to the wounds on her dorsal right toe would appear that these are felt to be arterial wounds. She has had surgery on the metatarsal phalangeal in 2015 I [name] secondary to a right metatarsal phalangeal joint fracture. She is apparently had discoloration around this area since then. 08/28/16; patient arrives with her wounds in much the same condition. The linear vein harvest site and the circular wound below it which I think was a blister. She also has to probing holes in her right great toe and a necrotic eschar on the right second toe. Because of these being arterial wounds I reduced her compression from 3-2 layers this seems to of done satisfactorily she has not had any problems. I cannot see that she is actually had an x-ray ====== 11/06/16 the patient comes in for evaluation of her right lower extremity ulcers. She was here in January 2018 for 2 visits subsequently ended up in the hospital with pneumonia and then to rehabilitation. She has now been discharged from rehabilitation and is home. She has multiple ulcerations to her right lower extremity including the foot and toes. She does have home health in place and they have been placing alginate to the ulcers. She is followed by [name] of vascular medicine. 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 She recently saw him on 3/23. In office ABIs were:  Right 0.48 with monophasic flow to the DP, PT, peroneal Left 0.63 with monophasic flow to the DP and PT Her arterial studies indicated a patent right below knee popliteal to peroneal bypass She had an MRI in February 2008 that was negative frosty myelitis but this showed general soft tissue edema in the right foot and lower extremity concerning  for cellulitis She is a diabetic, managed with insulin. Her hemoglobin A1c in December 2017 was 8.4 which is a trend up from previous levels. She had blood work in February 2018 which revealed an albumin of 2.6 this appears to be relatively acute as an albumin in November 2017 was 3.7 11/13/16; this is a patient I have not seen since February who is readmitted to our clinic last week. She is a type II diabetic on insulin with known severe PAD status post revascularization in the left leg by [name] I have reviewed [name] notes from March/23/18. Doppler ABI on that date showed an ABI on the right of 0.48 and on the left of 0.63. Dorsalis pedis waveforms were monophasic bilaterally. There was no waveforms detected at the posterior tibial on the right, monophasic on the left. [name], [name] ([id number]) [name]'s comments were that this patient would have follow-up vascular studies in 3 months including ABIs and right lower extremity arterial duplex. She had an MRI in February that was negative for osteomyelitis but showed generalized edema in the foot. Last albumin I see was in January at 3.4 we have been using Santyl to the 4 wounds on the right leg the patient is noted today to have widespread edema well up towards her groin this is pitting 2-3+. 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 Normal ejection fraction. 11/20/16; patient has a follow-up appointment with [name] on April 23. She is still complaining of a lot of pain in the right foot and right leg. It is not clear to me that this is at all positional however I think it is clear claudication with minimal activity perhaps at rest. At our suggestion she is return to her primary physician's office tomorrow with regards to her pitting lower extremity bilateral edema that I reviewed in detail last week 11/27/16; the follow-up with [name] was actually on May 23 on April 23 as I  stated in my note last week. N/A case all of her wounds seems somewhat smaller. The 2 on the right leg are definitely smaller. The areas on the dorsal right first toe, right third toe and the lateral part of the right fifth metatarsal head all looks smaller but have tightly adherent surfaces. We have been using Santyl 12/04/16; follow-up with [name] on May 23. 2 small open wounds on the right leg continue to get smaller. The area on the right third total lateral aspect of the right fifth toe also look better. The remaining area on the dorsal first toe still has some depth to it. We have been using Santyl to the toes and collagen on the right 12/11/16; according to patient's husband the follow-up with [name] is not until July. All of the wounds on the right leg are measuring smaller. We have been using some combination of Prisma and Santyl although I think we can go to straight Prisma today. There may have been some confusion with home health about the primary dressing orders here. 12/25/16; the patient has had some healing this week. The area on her right  lateral fifth metatarsal head, right third toe are both healed lower right leg is healed. In the vein harvest site superiorly she has one superficial open area. On the dorsal aspect of her right great toe/MTP joint the wound is now divided into 2 however the proximal area is deep and there is palpable bone 01/01/17; still an open area in the middle of her original right surgical scar. The area on the right third toe and right lateral fifth metatarsal head remained closed. Problematic area on the dorsal aspect of the toe. Previous surgery in this area line 01/08/17 small open area on the original scar on her upper anterior leg although this is closing. X-ray I did of the right first toe did not show underlying bony abnormality. Still this area on the dorsal first toe probes to bone. We have been using Prisma 01/15/17; small open area in the original scar  in the upper anterior leg is almost fully closed. She has 2 open areas over the dorsal aspect of the right first toe that probes to bone. Used and a form starting last week. Vigorous bone scraping that I did last week showed few methicillin sensitive staph aureus. She is allergic to penicillin and sulfa drugs. I'm going to give her 2 weeks of doxycycline. She will need an MRI with contrast. 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 She continues on doxycycline for a bone scraping that showed methicillin sensitive staph aureus [allergic to penicillin and sulfa]. We have been using Endo form to the wound. 01/29/17; surprisingly her MRI did not show osteomyelitis. She continues on doxycycline for a bone scraping that showed methicillin sensitive staph aureus with allergies to penicillin and sulfa. We have been using Endo form to the wound. Unfortunately I cannot get a surface on this visit looks like it is able to support a healing state. Proximally there is still exposed bone. There is no overt soft tissue tenderness. Her MRI did show a previous fracture was surgery to this area but no hardware 02/12/17 on evaluation today patient appears to be doing well in regard to her lower extremity wound. She does have some mild discomfort but this is minimal. There's no evidence of infection. Her left great toe nail has somewhat been lifted up and there is a little bit of slight bleeding underneath but this is still firmly attached. 02/19/17; she ran out of doxycycline 2 days ago. Nevertheless I would like to continue this for 2 weeks to make a full 6 weeks of therapy as the bone scraping that I did from the open area on the dorsal right first toe showed MRSA. This should complete antibiotic therapy. 02/26/17; the patient is completing her 6 weeks of oral doxycycline. Deep wound on the dorsal right toe. This still has some exposed bone  proximally. She does have a reasonably granulated surface albeit thin over bone. I've been using Endo form have made application for an amniotic skin sub 03/05/17; the patient has completed 6 weeks of doxycycline. This is a deep wound on the dorsal right toe which has exposed bone proximally. She has granulation over most of this wound albeit a thin layer. I've been using Endo form. Still do not have approval for Affinity 03/13/17 on evaluation today patient's right foot wound appears to be doing better measurement wise compared to her last evaluation. She has been tolerating the dressing change without complication and  does have a appointment with the dietary that has been made as far as referral is concerned there just waiting for her and transportation to contact them back for an actual date. Nonetheless patient has been having less pain at this point. No fevers, chills, nausea, or vomiting noted at this time. 03/26/17; linear wound over the dorsal aspect of her right great toe. At one point this had exposed bone on the most superior aspect however I am pleased to see today that this appears to have a surface of granulation. Apparently product was applied for but denied by [facility]. We'll need to see what that was. The patient has known PAD status post revascularization. MRI Kiss, Tajana V. ([id number]) did not show osteomyelitis which was done in June 04/02/17; continued improvement using Endoform. She was approved for [employer] but hasn't over $193 co-pay per application, this is beyond her means 04/09/17; continues on Endoform. 04/16/17; appears to be doing nicely continuing on Endoform 04/22/17; patient did not have her dressing changed all week because of the weather. Using Endoform. Base of the wound looks healthy elbow there appears to events surrounding maceration perhaps because of the drainage was not changing the wound dressing 04/30/17 wound today continues to close and except for a small divot on  the superior part of the wound. This area did not probe the bone but I found this a little concerning as this was the area with exposed bone before we be able to get this to granulate forward. As I remember things she is not a candidate for skin substitutes secondary to an outlandish co-pay. I asked her husband to look into the out of pocket max for medications if he has 1 [Regranex] or totally for skin substitutes 05/07/17; the patient arrives today with a wound roughly the same size although under careful inspection under the light there appears to be some epithelialization. Her intake nurse noted that the Endoform seemed to be placed over the wound rather than in the deeper divots they could really benefit from the Endoform. At this point I have no plans to change the Endoform as at one point this was at least 33% exposed bone and the rest of the wound very close to the underlying phalanx 05/14/17; no major change in the size or appearance of the wound. We have been using Endoform for a prolonged period of time with reasonably good improvement in the epithelialization i.e. no exposed bone especially proximally. Unless we've not made a lot of changes in the last several weeks. She does not complain of pain. She was revascularized early this year or perhaps late last year by [name] and I wonder if he will need to have a look at her, I will need to review the actual vascular procedure which I think was a distal popliteal bypass 05/21/17; switched to either for a blue last week. Patient arrived in clinic with the wound not measuring any better but the surface looking some better. Unfortunately she had some surface slough and when I went to remove this superiorly she had exposed bone. Previously she had had exposed bone in the inferior part of the wound however this is granulated over some weeks ago. Clearly this is a major step back for her. With some difficulty I was able to obtain a piece of the  bone probing through the superior part of the wound for culture. We did not send pathology. It is likely that she is going to need imaging of this site but  I did not order this today 05/28/17; bone culture I took last week showed MSSA. She still has exposed bone however I could not get another piece for pathology. She had an MRI 4 months ago that did not show osteoarthritic at time. Wound rapidly deteriorated superiorly and I suspect this is underlying osteomyelitis. We have been using Hydrofera Blue 06/04/17 on evaluation today patient's wound appears to actually be doing a little bit better visually compared to last week's evaluation which is good news. Nonetheless she does have osteomyelitis of the toe which does not appear to be MRSA. No fevers, chills, nausea, or vomiting noted at this time. She is having no discomfort. 06/11/17; patient's wound looks a little healthier than I remember seeing it 2 weeks ago. There is no exposed bone and the surface of the wound appears well granulated. We've been using hydrofera blue. I note that she was started on doxycycline which was MSSA. Her appointment with Dr. Ola Spurr is on November 12 I believe. She has bone documenting MSSA osteomyelitis Electronic Signature(s) Signed: 06/11/2017 5:39:07 PM By: Linton Ham MD Entered By: Linton Ham on 06/11/2017 17:28:54 [name], [name] ([id number]) -------------------------------------------------------------------------------- Physical Exam Details Patient Name: [name], [name] Date of Service: 06/11/2017 12:45 PM Medical Record Number: [id number] Patient Account Number: [id number] Date of Birth/Sex: [birth year] (76 y.o. Female) Treating RN: [name] Primary Care Provider: Beverlyn [name] Other Clinician: Referring Provider: Beverlyn [name] Treating Provider/Extender: [name] in Treatment: 75 Constitutional Patient is hypertensive.. Pulse regular and within target range for patient.[name]  Respirations regular, non-labored and within target range.. Temperature is normal and within the target range for the patient.[name] appears in no distress. Eyes Conjunctivae clear. No discharge. Respiratory Respiratory effort is easy and symmetric bilaterally. Rate is normal at rest and on room air.. . Cardiovascular . [name] Edema present in both extremities.I think this is lymphedema. Lymphatic and palpable in the popliteal areas bilaterally. Integumentary (Hair, Skin) no rashes seen. Psychiatric No evidence of depression, anxiety, or agitation. Calm, cooperative, and communicative. Appropriate interactions and affect.. Notes wound exam; in the 2 weeks it's been so I saw this wound it actually looks better. Better granulated surface and no exposed bone she is on doxycycline started last week and has an appointment with Dr. Ola Spurr of infectious disease next week She has known PAD status post revascularization I think she has enough blood supply here the heel the wound. I don't believe that the issue Electronic Signature(s) Signed: 06/11/2017 5:39:07 PM By: Linton Ham MD Entered By: Linton Ham on 06/11/2017 17:27:50 Vivero, Alannah [name] ([id number]) -------------------------------------------------------------------------------- Physician Orders Details Patient Name: [name], [name] Date of Service: 06/11/2017 12:45 PM Medical Record Number: [id number] Patient Account Number: [id number] Date of Birth/Sex: [birth year] (76 y.o. Female) Treating RN: Roger Shelter Primary Care Provider: Beverlyn [name] Other Clinician: Referring Provider: Beverlyn [name] Treating Provider/Extender: [name] in Treatment: 20 Verbal / Phone Orders: No Diagnosis Coding Wound Cleansing Wound #7 Right,Dorsal Metatarsal head first o Clean wound with Normal Saline. o Cleanse wound with mild soap and water Anesthetic Wound #7 Right,Dorsal Metatarsal head first o Topical Lidocaine 4% cream  applied to wound bed prior to debridement Primary Wound Dressing Wound #7 Right,Dorsal Metatarsal head first o Hydrafera Blue Secondary Dressing Wound #7 Right,Dorsal Metatarsal head first o ABD pad o Conform/Kerlix Dressing Change Frequency Wound #7 Right,Dorsal Metatarsal head first o Change Dressing Monday, Wednesday, Friday Follow-up Appointments Wound #7 Right,Dorsal Metatarsal head first o Return Appointment in 1 week. Edema  Control Wound #7 Right,Dorsal Metatarsal head first o Elevate legs to the level of the heart and pump ankles as often as possible Additional Orders / Instructions Wound #7 Right,Dorsal Metatarsal head first o Increase protein intake. o Activity as tolerated Home Health Wound #7 Right,Dorsal Metatarsal head first o Continue Home Health Visits - [facility] weekly, [facility]-Wednesday o Home Health Nurse may visit PRN to address patientos wound care needs. o FACE TO FACE ENCOUNTER: MEDICARE and MEDICAID PATIENTS: I certify that this patient is under my care and that I had a face-to-face encounter that meets the physician face-to-face encounter requirements with this Barclift, Ymani V. ([id number]) patient on this date. The encounter with the patient was in whole or in part for the following MEDICAL CONDITION: (primary reason for [place]) MEDICAL NECESSITY: I certify, that based on my findings, NURSING services are a medically necessary home health service. HOME BOUND STATUS: I certify that my clinical findings support that this patient is homebound (i.e., Due to illness or injury, pt requires aid of supportive devices such as crutches, cane, wheelchairs, walkers, the use of special transportation or the assistance of another person to leave their place of residence. There is a normal inability to leave the home and doing so requires considerable and taxing effort. Other absences are for medical reasons /  religious services and are infrequent or of short duration when for other reasons). o If current dressing causes regression in wound condition, may D/C ordered dressing product/s and apply Normal Saline Moist Dressing daily until next [place] / Other MD appointment. [place] of regression in wound condition at [phone]. o Please direct any NON-WOUND related issues/requests for orders to patient's Primary Care Physician Medications-please add to medication list. Wound #7 Right,Dorsal Metatarsal head first o P.O. Antibiotics - continue antibiotics as prescribed Electronic Signature(s) Signed: 06/11/2017 4:21:40 PM By: Roger Shelter Signed: 06/11/2017 5:39:07 PM By: Linton Ham MD Entered By: Roger Shelter on 06/11/2017 13:24:27 [name], [name] ([id number]) -------------------------------------------------------------------------------- Problem List Details Patient Name: Steichen, Gargi V. Date of Service: 06/11/2017 12:45 PM Medical Record Number: [id number] Patient Account Number: [id number] Date of Birth/Sex: [birth year] (77 y.o. 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 11/06/2016 Yes E11.52 Type 2 diabetes mellitus 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 Date of Service: 06/11/2017 12:45 PM Medical Record Number: [id number] Patient Account Number: [id number] Date of Birth/Sex: 25-Feb-1941 (76 y.o. Female) Treating RN: [name] Primary Care Provider: Beverlyn [name] Other Clinician: Referring Provider: Beverlyn [name] Treating Provider/Extender: [name] in Treatment: 31 Subjective History of Present Illness (HPI) 08/13/16: This is a  76 year old woman who came predominantly for review of 3 cm in diameter circular wound to the left anterior lateral leg. She was in the ER on 08/01/16 I reviewed their notes. There was apparently pus coming out of the wound at that time and the patient arrived requesting debridement which they don't do in the emergency room. Nevertheless I can't see that they did any x-rays. There were no cultures done. She is a type II diabetic and I a note after the patient was in the clinic that she had a bypass graft from the popliteal to the tibial on the right on 02/28/16. She also had a right greater saphenous vein harvest on the same date for arterial bypass. She is going to have vascular studies including ABIs T ABIs on the right on 08/28/16. The patient's surgery was on 02/28/16 by [name] she had a right below the knee popliteal artery to peroneal artery bypass with reverse greater saphenous vein and an endarterectomy of the mid segment peroneal artery. Postoperatively she had a strong mild monophasic peroneal signal with a pink foot. It would appear that the patient  is had some nonhealing in the surgical saphenous vein harvest site on the left leg. Surprisingly looking [facility] I cannot see much information about this at all. [name] notes from 05/29/16 show that the patient's wounds "are not healed" the right first metatarsal wound healed but then opened back up. 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 At that point the patient had a wound VAC to her right calf with regards to the wounds on her dorsal right toe would appear that these are felt to be arterial wounds. She has had surgery on the metatarsal phalangeal in 2015 I [name] secondary to a right metatarsal phalangeal joint fracture. She is apparently had discoloration around this area since then. 08/28/16; patient arrives with her wounds in much the same condition. The linear vein harvest site and the circular wound below it which I think was a blister. She also has to probing holes in her right great toe and a necrotic eschar on the right second toe. Because of these being arterial wounds I reduced her compression from 3-2 layers this seems to of done satisfactorily she has not had any problems. I cannot see that she is actually had an x-ray ====== 11/06/16 the patient comes in for evaluation of her right lower extremity ulcers. She was here in January 2018 for 2 visits subsequently ended up in the hospital with pneumonia and then to rehabilitation. She has now been discharged from rehabilitation and is home. She has multiple ulcerations to her right lower extremity including the foot and toes. She does have home health in place and they have been placing alginate to the ulcers. She is followed by [name] of vascular medicine. 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 She recently saw him on 3/23. In office  ABIs were: Right 0.48 with monophasic flow to the DP, PT, peroneal Left 0.63 with monophasic flow to the DP and PT Her arterial studies indicated a patent right below knee popliteal to peroneal bypass She had an MRI in February 2008 that was negative frosty myelitis but this showed general soft tissue edema in the right foot and lower extremity concerning for cellulitis She is a diabetic, managed with insulin.  Her hemoglobin A1c in December 2017 was 8.4 which is a trend up from previous levels. She had blood work in February 2018 which revealed an albumin of 2.6 this appears to be relatively acute as an albumin in November 2017 was 3.7 11/13/16; this is a patient I have not seen since February who is readmitted to our clinic last week. She is a type II diabetic on insulin with known severe PAD status post revascularization in the left leg by [name] I have reviewed [name] notes from March/23/18. Doppler ABI on that date showed an ABI on the right of 0.48 and on the left of 0.63. Dorsalis pedis waveforms were monophasic bilaterally. There was no waveforms detected at the posterior tibial on the right, monophasic on the left. [name] comments were that this patient would have follow-up vascular studies in 3 months including ABIs and right lower extremity arterial duplex. She had an MRI in February that was negative for osteomyelitis but showed generalized edema in [name], [name] ([id number]) the foot. Last albumin I see was in January at 3.4 we have been using Santyl to the 4 wounds on the right leg the patient is noted today to have widespread edema well up towards her groin this is pitting 2-3+. 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 Normal ejection fraction. 11/20/16; patient has a follow-up appointment with [name] on April 23. She is still complaining of a lot of pain in the right foot and right leg. It is not clear to me  that this is at all positional however I think it is clear claudication with minimal activity perhaps at rest. At our suggestion she is return to her primary physician's office tomorrow with regards to her pitting lower extremity bilateral edema that I reviewed in detail last week 11/27/16; the follow-up with [name] was actually on May 23 on April 23 as I stated in my note last week. N/A case all of her wounds seems somewhat smaller. The 2 on the right leg are definitely smaller. The areas on the dorsal right first toe, right third toe and the lateral part of the right fifth metatarsal head all looks smaller but have tightly adherent surfaces. We have been using Santyl 12/04/16; follow-up with [name] on May 23. 2 small open wounds on the right leg continue to get smaller. The area on the right third total lateral aspect of the right fifth toe also look better. The remaining area on the dorsal first toe still has some depth to it. We have been using Santyl to the toes and collagen on the right 12/11/16; according to patient's husband the follow-up with [name] is not until July. All of the wounds on the right leg are measuring smaller. We have been using some combination of Prisma and Santyl although I think we can go to straight Prisma today. There may have been some confusion with home health about the primary dressing orders here. 12/25/16; the patient has had some healing this week. The area on her right lateral fifth metatarsal head, right third toe are both healed lower right leg is healed. In the vein harvest site superiorly she has one superficial open area. On the dorsal aspect of her right great toe/MTP joint the wound is now divided into 2 however the proximal area is deep and there is palpable bone 01/01/17; still an open area in the middle of her original right surgical scar. The  area on the right third toe and right lateral fifth metatarsal head remained closed. Problematic area on the  dorsal aspect of the toe. Previous surgery in this area line 01/08/17 small open area on the original scar on her upper anterior leg although this is closing. X-ray I did of the right first toe did not show underlying bony abnormality. Still this area on the dorsal first toe probes to bone. We have been using Prisma 01/15/17; small open area in the original scar in the upper anterior leg is almost fully closed. She has 2 open areas over the dorsal aspect of the right first toe that probes to bone. Used and a form starting last week. Vigorous bone scraping that I did last week showed few methicillin sensitive staph aureus. She is allergic to penicillin and sulfa drugs. I'm going to give her 2 weeks of doxycycline. She will need an MRI with contrast. 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 She continues on doxycycline for a bone scraping that showed methicillin sensitive staph aureus [allergic to penicillin and sulfa]. We have been using Endo form to the wound. 01/29/17; surprisingly her MRI did not show osteomyelitis. She continues on doxycycline for a bone scraping that showed methicillin sensitive staph aureus with allergies to penicillin and sulfa. We have been using Endo form to the wound. Unfortunately I cannot get a surface on this visit looks like it is able to support a healing state. Proximally there is still exposed bone. There is no overt soft tissue tenderness. Her MRI did show a previous fracture was surgery to this area but no hardware 02/12/17 on evaluation today patient appears to be doing well in regard to her lower extremity wound. She does have some mild discomfort but this is minimal. There's no evidence of infection. Her left great toe nail has somewhat been lifted up and there is a little bit of slight bleeding underneath but this is still firmly attached. 02/19/17; she ran out of doxycycline 2 days ago.  Nevertheless I would like to continue this for 2 weeks to make a full 6 weeks of therapy as the bone scraping that I did from the open area on the dorsal right first toe showed MRSA. This should complete antibiotic therapy. 02/26/17; the patient is completing her 6 weeks of oral doxycycline. Deep wound on the dorsal right toe. This still has some exposed bone proximally. She does have a reasonably granulated surface albeit thin over bone. I've been using Endo form have made application for an amniotic skin sub 03/05/17; the patient has completed 6 weeks of doxycycline. This is a deep wound on the dorsal right toe which has exposed bone proximally. She has granulation over most of this wound albeit a thin layer. I've been using Endo form. Still do not have approval for [employer] 03/13/17 on evaluation today patient's right foot wound appears to be doing better measurement wise compared to her last evaluation. She has been tolerating the dressing change without complication and does have a appointment with the dietary that has been made as far as referral is concerned there just waiting for her and transportation to contact them back for an actual date. Nonetheless patient has been having less pain at this point. No fevers, chills, nausea, or vomiting noted at this time. 03/26/17; linear wound over the dorsal aspect of her right great toe. At one point this had exposed  bone on the most superior aspect however I am pleased to see today that this appears to have a surface of granulation. Apparently product was applied for but denied by [facility]. We'll need to see what that was. The patient has known PAD status post revascularization. MRI did not show osteomyelitis which was done in June 04/02/17; continued improvement using Endoform. She was approved for [facility] but hasn't over $269 co-pay per application, [name], [name] ([id number]) this is beyond her means 04/09/17; continues on Endoform. 04/16/17; appears to  be doing nicely continuing on Endoform 04/22/17; patient did not have her dressing changed all week because of the weather. Using Endoform. Base of the wound looks healthy elbow there appears to events surrounding maceration perhaps because of the drainage was not changing the wound dressing 04/30/17 wound today continues to close and except for a small divot on the superior part of the wound. This area did not probe the bone but I found this a little concerning as this was the area with exposed bone before we be able to get this to granulate forward. As I remember things she is not a candidate for skin substitutes secondary to an outlandish co-pay. I asked her husband to look into the out of pocket max for medications if he has 1 [Regranex] or totally for skin substitutes 05/07/17; the patient arrives today with a wound roughly the same size although under careful inspection under the light there appears to be some epithelialization. Her intake nurse noted that the Endoform seemed to be placed over the wound rather than in the deeper divots they could really benefit from the Endoform. At this point I have no plans to change the Endoform as at one point this was at least 33% exposed bone and the rest of the wound very close to the underlying phalanx 05/14/17; no major change in the size or appearance of the wound. We have been using Endoform for a prolonged period of time with reasonably good improvement in the epithelialization i.e. no exposed bone especially proximally. Unless we've not made a lot of changes in the last several weeks. She does not complain of pain. She was revascularized early this year or perhaps late last year by [name] and I wonder if he will need to have a look at her, I will need to review the actual vascular procedure which I think was a distal popliteal bypass 05/21/17; switched to either for a blue last week. Patient arrived in clinic with the wound not measuring any better  but the surface looking some better. Unfortunately she had some surface slough and when I went to remove this superiorly she had exposed bone. Previously she had had exposed bone in the inferior part of the wound however this is granulated over some weeks ago. Clearly this is a major step back for her. With some difficulty I was able to obtain a piece of the bone probing through the superior part of the wound for culture. We did not send pathology. It is likely that she is going to need imaging of this site but I did not order this today 05/28/17; bone culture I took last week showed MSSA. She still has exposed bone however I could not get another piece for pathology. She had an MRI 4 months ago that did not show osteoarthritic at time. Wound rapidly deteriorated superiorly and I suspect this is underlying osteomyelitis. We have been using Hydrofera Blue 06/04/17 on evaluation today patient's wound appears to actually be  doing a little bit better visually compared to last week's evaluation which is good news. Nonetheless she does have osteomyelitis of the toe which does not appear to be MRSA. No fevers, chills, nausea, or vomiting noted at this time. She is having no discomfort. 06/11/17; patient's wound looks a little healthier than I remember seeing it 2 weeks ago. There is no exposed bone and the surface of the wound appears well granulated. We've been using hydrofera blue. I note that she was started on doxycycline which was MSSA. Her appointment with Dr. Ola Spurr is on November 12 I believe. She has bone documenting MSSA osteomyelitis Objective Constitutional Patient is hypertensive.. Pulse regular and within target range for patient.[name] Respirations regular, non-labored and within target range.. Temperature is normal and within the target range for the patient.[name] appears in no distress. Vitals Time Taken: 12:47 PM, Height: 64 in, Weight: 200 lbs, BMI: 34.3, Temperature: 98.7 F, Pulse: 76  bpm, Respiratory Rate: 16 breaths/min, Blood Pressure: 155/70 mmHg. Eyes Conjunctivae clear. No discharge. Respiratory [name], [name] ([id number]) Respiratory effort is easy and symmetric bilaterally. Rate is normal at rest and on room air.. Cardiovascular Edema present in both extremities.I think this is lymphedema. Lymphatic and palpable in the popliteal areas bilaterally. Psychiatric No evidence of depression, anxiety, or agitation. Calm, cooperative, and communicative. Appropriate interactions and affect.. General Notes: wound exam; in the 2 weeks it's been so I saw this wound it actually looks better. Better granulated surface and no exposed bone she is on doxycycline started last week and has an appointment with Dr. Ola Spurr of infectious disease next week She has known PAD status post revascularization I think she has enough blood supply here the heel the wound. I don't believe that the issue Integumentary (Hair, Skin) no rashes seen. Wound #7 status is Open. Original cause of wound was Gradually Appeared. The wound is located on the Right,Dorsal Metatarsal head first. The wound measures 2cm length x 0.6cm width x 0.2cm depth; 0.942cm^2 area and 0.188cm^3 volume. There is Fat Layer (Subcutaneous Tissue) Exposed exposed. There is no tunneling or undermining noted. There is a medium amount of serous drainage noted. The wound margin is flat and intact. There is medium (34-66%) red granulation within the wound bed. There is a medium (34-66%) amount of necrotic tissue within the wound bed including Adherent Slough. The periwound skin appearance exhibited: Induration, Hemosiderin Staining. The periwound skin appearance did not exhibit: Callus, Crepitus, Excoriation, Rash, Scarring, Dry/Scaly, Maceration, Atrophie Blanche, Cyanosis, Ecchymosis, Mottled, Pallor, Rubor, Erythema. The periwound has tenderness on palpation. 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 Cleanse wound with mild soap and water Anesthetic: [name], [name] ([id number]) Wound #7 Right,Dorsal Metatarsal head first: Topical Lidocaine 4% cream applied to wound bed prior to debridement Primary Wound Dressing: Wound #7 Right,Dorsal Metatarsal head first: Hydrafera Blue Secondary Dressing: Wound #7 Right,Dorsal Metatarsal head first: ABD pad  Conform/Kerlix Dressing Change Frequency: Wound #7 Right,Dorsal Metatarsal head first: Change Dressing Monday, Wednesday, Friday Follow-up Appointments: Wound #7 Right,Dorsal Metatarsal head first: Return Appointment in 1 week. Edema Control: Wound #7 Right,Dorsal Metatarsal head first: Elevate legs to the level of the heart and pump ankles as often as possible Additional Orders / Instructions: Wound #7 Right,Dorsal Metatarsal head first: Increase protein intake. Activity as tolerated Home Health: Wound #7 Right,Dorsal Metatarsal head first: [place] [facility] - [facility] twice weekly, [facility] Home Health Nurse may visit PRN to address patient s wound care needs. FACE TO FACE ENCOUNTER: MEDICARE and MEDICAID PATIENTS: I certify that this patient is under my care and that I had a face-to-face encounter that meets the physician face-to-face  encounter requirements with this patient on this date. The encounter with the patient was in whole or in part for the following MEDICAL CONDITION: (primary reason for [place]) MEDICAL NECESSITY: I certify, that based on my findings, NURSING services are a medically necessary home health service. HOME BOUND STATUS: I certify that my clinical findings support that this patient is homebound (i.e., Due to illness or injury, pt requires aid of supportive devices such as crutches, cane, wheelchairs, walkers, the use of special transportation or the assistance of another person to leave their place of residence. There is a normal inability to leave the home and doing so requires considerable and taxing effort. Other absences are for medical reasons / religious services and are infrequent or of short duration when for other reasons). If current dressing causes regression in wound condition, may D/C ordered dressing product/s and apply Normal Saline Moist Dressing daily until next [place] / Other MD appointment. [place] of regression in wound condition at [phone]. Please direct any NON-WOUND related issues/requests for orders to patient's Primary Care Physician Medications-please add to medication list.: Wound #7 Right,Dorsal Metatarsal head first: P.O. Antibiotics - continue antibiotics as prescribed #1continue Hydrofera Blue ABDs, form Kerlix #2 continue doxycycline Electronic Signature(s) Signed: 06/11/2017 5:29:23 PM By: Linton Ham MD Entered By: Linton Ham on 06/11/2017 17:29:23 [name], [name] ([id number]) [name], [name] ([id number]) -------------------------------------------------------------------------------- SuperBill Details Patient Name: Schalk, Meenakshi V. Date of Service: 06/11/2017 Medical Record Number: [id number] Patient Account Number: [id number] Date of Birth/Sex: 1941/04/03 (76 y.o. 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 I70.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, right ankle and foot Facility Procedures CPT4 Code: [id number] Description: 99213 - WOUND CARE VISIT-LEV 3 EST PT Modifier: Quantity: 1 Physician Procedures CPT4 Code Description: [id number] [place] - WC PHYS LEVEL 3 - EST PT ICD-10 Diagnosis Description E11.622 Type 2 diabetes mellitus with other skin ulcer L97.519 Non-pressure chronic ulcer of other part of right foot with unspe M86.371 Chronic multifocal  osteomyelitis, right ankle and foot Modifier: cified severit Quantity: 1 y Engineer, maintenance) Signed: 06/12/2017 3:28:56 PM By: Roger Shelter Previous Signature: 06/11/2017 5:39:07 PM Version By: Dellia Nims,  [name] Como MD Entered By: Roger Shelter on 06/12/2017 15:28:55

## 2017-06-18 ENCOUNTER — Encounter: Payer: Medicare HMO | Admitting: Internal Medicine

## 2017-06-18 DIAGNOSIS — E11622 Type 2 diabetes mellitus with other skin ulcer: Secondary | ICD-10-CM | POA: Diagnosis not present

## 2017-06-19 NOTE — Progress Notes (Signed)
[name] ([id number]) Visit Report for 06/18/2017 Debridement Details Patient Name: [name], [name] Date of Service: 06/18/2017 12:45 PM Medical Record Number: [id number] Patient Account Number: [id number] Date of Birth/Sex: 1941/06/16 (76 y.o. Female) Treating RN: [name] Primary Care Provider: Beverlyn [name] Other Clinician: Referring Provider: Beverlyn [name] Treating Provider/Extender: [name] in Treatment: 32 Debridement Performed for Wound #7 Right,Dorsal Metatarsal head first Assessment: Performed By: Physician [name] Dillon, MD Debridement: Debridement Severity of Tissue Pre Fat layer exposed Debridement: Pre-procedure Verification/Time Yes - 01:05 Out Taken: Start Time: 01:06 Pain Control: Other : lidocaine 4% Level: Skin/Subcutaneous Tissue Total Area Debrided (L x W): 1.8 (cm) x 0.7 (cm) = 1.26 (cm) Tissue and other material Viable, Non-Viable, Eschar, Fibrin/Slough, Subcutaneous debrided: Instrument: Curette Bleeding: Moderate Hemostasis Achieved: Pressure End Time: 01:07 Procedural Pain: 0 Post Procedural Pain: 0 Response to Treatment: Procedure was tolerated well Post Debridement Measurements of Total Wound Length: (cm) 1.8 Width: (cm) 0.7 Depth: (cm) 0.3 Volume: (cm) 0.297 Character of Wound/Ulcer Post Debridement: Stable Severity of Tissue Post Debridement: Fat layer exposed Post Procedure Diagnosis Same as Pre-procedure Electronic Signature(s) Signed: 06/18/2017 4:36:42 PM By: [name] Ham MD Signed: 06/18/2017 4:44:57 PM By: [name] Entered By: [name] on 06/18/2017 13:08:31 [name], [name] ([id number]) -------------------------------------------------------------------------------- HPI Details Patient Name: [name], [name] Date of Service: 06/18/2017 12:45 PM Medical Record Number: [id number] Patient Account Number: [id number] Date of Birth/Sex: 1941-02-02 (76 y.o. Female) Treating RN: [name] Primary Care Provider: Beverlyn [name] Other Clinician: Referring Provider: Beverlyn [name] Treating Provider/Extender: [name] in Treatment: 32 History of Present Illness HPI Description: 08/13/16: This is a 76 year old woman who came predominantly for review of 3 cm in diameter circular wound to the left anterior lateral leg. She was in the ER on 08/01/16 I reviewed their [name]. There was apparently pus coming out of the wound at that time and the patient arrived requesting debridement which they don't do in the emergency room. Nevertheless I can't see that they did any x-rays. There were no cultures done. She is a type II diabetic and I a note after the patient was in the clinic that she had a bypass graft from the popliteal to the tibial on the right on 02/28/16. She also had a right greater saphenous vein harvest on the same date for arterial bypass. She is going to have vascular studies including ABIs T ABIs on the right on 08/28/16. The patient's surgery was on 02/28/16 by Dr. Vallarie [name] she had a right below the knee popliteal artery to peroneal artery bypass with reverse greater saphenous vein and an endarterectomy of the mid segment peroneal artery. Postoperatively she had a strong mild monophasic peroneal signal with a pink foot. It would appear that the patient is had some nonhealing in the surgical saphenous vein harvest site on the left leg. Surprisingly looking through [facility] I cannot see much information about this at all. [name] from 05/29/16 show that the patient's wounds "are not healed" the right first metatarsal wound healed but then opened back up. 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 At that point the patient had a wound VAC to her right calf with regards to the wounds on her dorsal right toe would appear that these are felt to be arterial  wounds. She has had surgery on the metatarsal phalangeal in 2015 I [name] secondary to  a right metatarsal phalangeal joint fracture. She is apparently had discoloration around this area since then. 08/28/16; patient arrives with her wounds in much the same condition. The linear vein harvest site and the circular wound below it which I think was a blister. She also has to probing holes in her right great toe and a necrotic eschar on the right second toe. Because of these being arterial wounds I reduced her compression from 3-2 layers this seems to of done satisfactorily she has not had any problems. I cannot see that she is actually had an x-ray ====== 11/06/16 the patient comes in for evaluation of her right lower extremity ulcers. She was here in January 2018 for 2 visits subsequently ended up in the hospital with pneumonia and then to rehabilitation. She has now been discharged from rehabilitation and is home. She has multiple ulcerations to her right lower extremity including the foot and toes. She does have home health in place and they have been placing alginate to the ulcers. She is followed by [name] of vascular medicine. 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 She recently saw him on 3/23. In office ABIs were: Right 0.48 with monophasic flow to the DP, PT, peroneal Left 0.63 with monophasic flow to the DP and PT Her arterial studies indicated a patent right below knee popliteal to peroneal bypass She had an MRI in February 2008 that was negative frosty myelitis but this showed general soft tissue edema in the right foot and lower extremity concerning for cellulitis She is a diabetic, managed with insulin. Her hemoglobin A1c in December 2017 was 8.4 which is a trend up from previous levels. She had blood work in February 2018 which revealed an albumin of 2.6 this appears to be relatively acute as an albumin in  November 2017 was 3.7 11/13/16; this is a patient I have not seen since February who is readmitted to our clinic last week. She is a type II diabetic on insulin with known severe PAD status post revascularization in the left leg by [name] I have reviewed Dr. Lianne Moris [name] from March/23/18. Doppler ABI on that date showed an ABI on the right of 0.48 and on the left of 0.63. Dorsalis pedis waveforms were monophasic bilaterally. There was no waveforms detected at the posterior tibial on the right, monophasic on the left. Dr. Lianne Moris comments were that this patient would have follow-up vascular studies in 3 months including ABIs and right lower extremity arterial duplex. She had an MRI in February that was negative for osteomyelitis but showed generalized edema in the foot. Last albumin I see was in January at 3.4 we have been using Santyl to the 4 wounds on the right leg Huron, Kataleena V. ([id number]) the patient is noted today to have widespread edema well up towards her groin this is pitting 2-3+. 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 Normal ejection fraction. 11/20/16; patient has a follow-up appointment with [name] on April 23. She is still complaining of a lot of pain in the right foot and right leg. It is not clear to me that this is at all positional however I think it is clear claudication with minimal activity perhaps at rest. At our suggestion she is return to her primary physician's office tomorrow with regards to her pitting lower extremity bilateral edema that I reviewed in detail last  week 11/27/16; the follow-up with [name] was actually on May 23 on April 23 as I stated in my note last week. N/A case all of her wounds seems somewhat smaller. The 2 on the right leg are definitely smaller. The areas on the dorsal right first toe, right third toe and the lateral part of the right fifth metatarsal head all looks smaller but have  tightly adherent surfaces. We have been using Santyl 12/04/16; follow-up with [name] on May 23. 2 small open wounds on the right leg continue to get smaller. The area on the right third total lateral aspect of the right fifth toe also look better. The remaining area on the dorsal first toe still has some depth to it. We have been using Santyl to the toes and collagen on the right 12/11/16; according to patient's husband the follow-up with [name] is not until July. All of the wounds on the right leg are measuring smaller. We have been using some combination of Prisma and Santyl although I think we can go to straight Prisma today. There may have been some confusion with home health about the primary dressing orders here. 12/25/16; the patient has had some healing this week. The area on her right lateral fifth metatarsal head, right third toe are both healed lower right leg is healed. In the vein harvest site superiorly she has one superficial open area. On the dorsal aspect of her right great toe/MTP joint the wound is now divided into 2 however the proximal area is deep and there is palpable bone 01/01/17; still an open area in the middle of her original right surgical scar. The area on the right third toe and right lateral fifth metatarsal head remained closed. Problematic area on the dorsal aspect of the toe. Previous surgery in this area line 01/08/17 small open area on the original scar on her upper anterior leg although this is closing. X-ray I did of the right first toe did not show underlying bony abnormality. Still this area on the dorsal first toe probes to bone. We have been using Prisma 01/15/17; small open area in the original scar in the upper anterior leg is almost fully closed. She has 2 open areas over the dorsal aspect of the right first toe that probes to bone. Used and a form starting last week. Vigorous bone scraping that I did last week showed few methicillin sensitive staph aureus.  She is allergic to penicillin and sulfa drugs. I'm going to give her 2 weeks of doxycycline. She will need an MRI with contrast. 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 She continues on doxycycline for a bone scraping that showed methicillin sensitive staph aureus [allergic to penicillin and sulfa]. We have been using Endo form to the wound. 01/29/17; surprisingly her MRI did not show osteomyelitis. She continues on doxycycline for a bone scraping that showed methicillin sensitive staph aureus with allergies to penicillin and sulfa. We have been using Endo form to the wound. Unfortunately I cannot get a surface on this visit looks like it is able to support a healing state. Proximally there is still exposed bone. There is no overt soft tissue tenderness. Her MRI did show a previous fracture was surgery to this area but no hardware 02/12/17 on evaluation today patient appears to be doing well in regard to her lower extremity wound. She does have some mild discomfort  but this is minimal. There's no evidence of infection. Her left great toe nail has somewhat been lifted up and there is a little bit of slight bleeding underneath but this is still firmly attached. 02/19/17; she ran out of doxycycline 2 days ago. Nevertheless I would like to continue this for 2 weeks to make a full 6 weeks of therapy as the bone scraping that I did from the open area on the dorsal right first toe showed MRSA. This should complete antibiotic therapy. 02/26/17; the patient is completing her 6 weeks of oral doxycycline. Deep wound on the dorsal right toe. This still has some exposed bone proximally. She does have a reasonably granulated surface albeit thin over bone. I've been using Endo form have made application for an amniotic skin sub 03/05/17; the patient has completed 6 weeks of doxycycline. This is a deep wound on the dorsal right toe which  has exposed bone proximally. She has granulation over most of this wound albeit a thin layer. I've been using Endo form. Still do not have approval for Affinity 03/13/17 on evaluation today patient's right foot wound appears to be doing better measurement wise compared to her last evaluation. She has been tolerating the dressing change without complication and does have a appointment with the dietary that has been made as far as referral is concerned there just waiting for her and transportation to contact them back for an actual date. Nonetheless patient has been having less pain at this point. No fevers, chills, nausea, or vomiting noted at this time. 03/26/17; linear wound over the dorsal aspect of her right great toe. At one point this had exposed bone on the most superior aspect however I am pleased to see today that this appears to have a surface of granulation. Apparently product was applied for but denied by [facility]. We'll need to see what that was. The patient has known PAD status post revascularization. MRI did not show osteomyelitis which was done in June 04/02/17; continued improvement using Endoform. She was approved for [employer] but hasn't over $409 co-pay per application, this is beyond her means [name], [name] ([id number]) 04/09/17; continues on Endoform. 04/16/17; appears to be doing nicely continuing on Endoform 04/22/17; patient did not have her dressing changed all week because of the weather. Using Endoform. Base of the wound looks healthy elbow there appears to events surrounding maceration perhaps because of the drainage was not changing the wound dressing 04/30/17 wound today continues to close and except for a small divot on the superior part of the wound. This area did not probe the bone but I found this a little concerning as this was the area with exposed bone before we be able to get this to granulate forward. As I remember things she is not a candidate for skin substitutes  secondary to an outlandish co-pay. I asked her husband to look into the out of pocket max for medications if he has 1 [Regranex] or totally for skin substitutes 05/07/17; the patient arrives today with a wound roughly the same size although under careful inspection under the light there appears to be some epithelialization. Her intake nurse noted that the Endoform seemed to be placed over the wound rather than in the deeper divots they could really benefit from the Endoform. At this point I have no plans to change the Endoform as at one point this was at least 33% exposed bone and the rest of the wound very close to the underlying phalanx 05/14/17; no  major change in the size or appearance of the wound. We have been using Endoform for a prolonged period of time with reasonably good improvement in the epithelialization i.e. no exposed bone especially proximally. Unless we've not made a lot of changes in the last several weeks. She does not complain of pain. She was revascularized early this year or perhaps late last year by [name] and I wonder if he will need to have a look at her, I will need to review the actual vascular procedure which I think was a distal popliteal bypass 05/21/17; switched to either for a blue last week. Patient arrived in clinic with the wound not measuring any better but the surface looking some better. Unfortunately she had some surface slough and when I went to remove this superiorly she had exposed bone. Previously she had had exposed bone in the inferior part of the wound however this is granulated over some weeks ago. Clearly this is a major step back for her. With some difficulty I was able to obtain a piece of the bone probing through the superior part of the wound for culture. We did not send pathology. It is likely that she is going to need imaging of this site but I did not order this today 05/28/17; bone culture I took last week showed MSSA. She still has exposed  bone however I could not get another piece for pathology. She had an MRI 4 months ago that did not show osteoarthritic at time. Wound rapidly deteriorated superiorly and I suspect this is underlying osteomyelitis. We have been using Hydrofera Blue 06/04/17 on evaluation today patient's wound appears to actually be doing a little bit better visually compared to last week's evaluation which is good news. Nonetheless she does have osteomyelitis of the toe which does not appear to be MRSA. No fevers, chills, nausea, or vomiting noted at this time. She is having no discomfort. 06/11/17; patient's wound looks a little healthier than I remember seeing it 2 weeks ago. There is no exposed bone and the surface of the wound appears well granulated. We've been using hydrofera blue. I note that she was started on doxycycline which was MSSA. Her appointment with Dr. Ola Spurr is on November 12 I believe. 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 Vallarie [name] of vascular surgery on 11/9. He offered right leg angiography possible intervention however the patient refused. I've discussed this with the patient's husband today she did not want any further procedures. Also noteworthy that Dr. Vallarie [name] stated that this wound had not healed in 3 years, I was not aware of this degree of chronicity in this area. She has underlying osteomyelitis by bone culture. Culture of this grew MSSA, she is allergic to penicillin and therefore not a candidate for beta lactams. She saw Dr. Ola Spurr of infectious disease this morning, he continued her on doxycycline for another month and apparently sent in a prescription. We have been using Hydrofera Blue. ABI's update by [name] right 0.62, left 0.89. Monophasic wave forms Electronic Signature(s) Signed: 06/18/2017 4:36:42 PM By: [name] Ham MD Entered By: [name] on 06/18/2017 13:32:19 Mould, [name]  ([id number]) -------------------------------------------------------------------------------- Physical Exam Details Patient Name: [name], [name] Date of Service: 06/18/2017 12:45 PM Medical Record Number: [id number] Patient Account Number: [id number] Date of Birth/Sex: 1940-08-20 (76 y.o. Female) Treating RN: [name] Primary Care Provider: Beverlyn [name] Other Clinician: Referring Provider: Beverlyn [name] Treating Provider/Extender: [name] in Treatment: 30 Constitutional Patient is hypertensive.[name]  Pulse regular and within target range for patient.[name] Respirations regular, non-labored and within target range.. Temperature is normal and within the target range for the patient.[name] appears in no distress. [name] Wound exam; the wound is on the dorsal aspect of her right great toe. There is still exposed bone and a small area however most of the wound is covered with adherent necrotic material. Using #3 curet this is debrided off. Hemostasis with direct pressure and relieve the surface doesn't look so bad although is noted there is a small hole that probes to bone. The stasis with direct pressure Electronic Signature(s) Signed: 06/18/2017 4:36:42 PM By: [name] Ham MD Entered By: [name] on 06/18/2017 13:26:58 Meras, [name] ([id number]) -------------------------------------------------------------------------------- Physician Orders Details Patient Name: Kozinski, Devina V. Date of Service: 06/18/2017 12:45 PM Medical Record Number: [id number] Patient Account Number: [id number] Date of Birth/Sex: Feb 01, 1941 (76 y.o. Female) Treating RN: [name] Primary Care Provider: Beverlyn [name] Other Clinician: Referring Provider: Beverlyn [name] Treating Provider/Extender: [name] in Treatment: 38 Verbal / Phone Orders: No Diagnosis Coding Wound Cleansing Wound #7 Right,Dorsal Metatarsal head first o Clean wound with Normal Saline. o Cleanse wound with mild  soap and water Anesthetic Wound #7 Right,Dorsal Metatarsal head first o Topical Lidocaine 4% cream applied to wound bed prior to debridement Primary Wound Dressing Wound #7 Right,Dorsal Metatarsal head first o Hydrafera Blue Secondary Dressing Wound #7 Right,Dorsal Metatarsal head first o ABD pad o Conform/Kerlix Dressing Change Frequency Wound #7 Right,Dorsal Metatarsal head first o Change Dressing Monday, Wednesday, Friday Follow-up Appointments Wound #7 Right,Dorsal Metatarsal head first o Return Appointment in 1 week. Edema Control Wound #7 Right,Dorsal Metatarsal head first o Elevate legs to the level of the heart and pump ankles as often as possible Additional Orders / Instructions Wound #7 Right,Dorsal Metatarsal head first o Increase protein intake. o Activity as tolerated Home Health Wound #7 Right,Dorsal Metatarsal head first o Continue Home Health Visits - [facility] weekly, [facility]-Wednesday o Home Health Nurse may visit PRN to address patientos wound care needs. o FACE TO FACE ENCOUNTER: MEDICARE and MEDICAID PATIENTS: I certify that this patient is under my care and that I had a face-to-face encounter that meets the physician face-to-face encounter requirements with this [name], [name] ([id number]) patient on this date. The encounter with the patient was in whole or in part for the following MEDICAL CONDITION: (primary reason for Cataract) MEDICAL NECESSITY: I certify, that based on my findings, NURSING services are a medically necessary home health service. HOME BOUND STATUS: I certify that my clinical findings support that this patient is homebound (i.e., Due to illness or injury, pt requires aid of supportive devices such as crutches, cane, wheelchairs, walkers, the use of special transportation or the assistance of another person to leave their place of residence. There is a normal inability to leave the home and  doing so requires considerable and taxing effort. Other absences are for medical reasons / religious services and are infrequent or of short duration when for other reasons). o If current dressing causes regression in wound condition, may D/C ordered dressing product/s and apply Normal Saline Moist Dressing daily until next [place] / Other MD appointment. Mena of regression in wound condition at [phone]. o Please direct any NON-WOUND related issues/requests for orders to patient's Primary Care Physician Medications-please add to medication list. Wound #7 Right,Dorsal Metatarsal head first o P.O. Antibiotics - continue antibiotics as prescribed [name] Apligraft authorization  Electronic Signature(s) Signed: 06/18/2017 4:36:42 PM By: [name] Ham MD Signed: 06/18/2017 4:44:57 PM By: [name] Entered By: [name] on 06/18/2017 13:11:35 [name], [name] ([id number]) -------------------------------------------------------------------------------- Problem List Details Patient Name: [name], [name] Date of Service: 06/18/2017 12:45 PM Medical Record Number: [id number] Patient Account Number: [id number] Date of Birth/Sex: [birth year] (76 y.o. 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 11/06/2016 Yes E11.52 Type 2 diabetes 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 Date of Service: 06/18/2017 12:45 PM Medical Record Number: [id number] Patient Account Number: [id number] Date of Birth/Sex: 1941-07-12 (76 y.o. Female) Treating RN: [name] Primary Care Provider: Beverlyn [name] Other Clinician: Referring Provider: Beverlyn [name] Treating Provider/Extender: [name] in Treatment: 32 Subjective History of Present Illness (HPI) 08/13/16: This is a 76 year old woman who came predominantly for review of 3 cm in diameter circular wound to the left anterior lateral leg. She was in the ER on 08/01/16 I reviewed their [name]. There was apparently pus coming out of the wound at that time and the patient arrived requesting debridement which they don't do in the emergency room. Nevertheless I can't see that they did any x-rays. There were no cultures done. She is a type II diabetic and I a note after the patient was in the clinic that she had a bypass graft from the popliteal to the tibial on the right on 02/28/16. She also had a right greater saphenous vein harvest on the same date for arterial bypass. She is going to have vascular studies including ABIs T ABIs on the right on 08/28/16. The patient's surgery was on 02/28/16 by Dr. Vallarie [name] she had a right below the knee popliteal artery to peroneal artery bypass with reverse greater saphenous vein and an endarterectomy of the mid segment  peroneal artery. Postoperatively she had a strong mild monophasic peroneal signal with a pink foot. It would appear that the patient is had some nonhealing in the surgical saphenous vein harvest site on the left leg. Surprisingly looking through [facility] I cannot see much information about this at all. [name] from 05/29/16 show that the patient's wounds "are not healed" the right first metatarsal wound healed but then opened back up. 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 At that point the patient had a wound VAC to her right calf with regards to the wounds on her dorsal right toe would appear that these are felt to be arterial wounds. She has had surgery on the metatarsal phalangeal in 2015 I [name] secondary to a  right metatarsal phalangeal joint fracture. She is apparently had discoloration around this area since then. 08/28/16; patient arrives with her wounds in much the same condition. The linear vein harvest site and the circular wound below it which I think was a blister. She also has to probing holes in her right great toe and a necrotic eschar on the right second toe. Because of these being arterial wounds I reduced her compression from 3-2 layers this seems to of done satisfactorily she has not had any problems. I cannot see that she is actually had an x-ray ====== 11/06/16 the patient comes in for evaluation of her right lower extremity ulcers. She was here in January 2018 for 2 visits subsequently ended up in the hospital with pneumonia and then to rehabilitation. She has now been discharged from rehabilitation and is home. She has multiple ulcerations to her right lower extremity including the foot and toes. She does have home health in place and they have been placing alginate to the ulcers. She is followed by [name] of vascular medicine. She is status post  a bypass graft to the right below knee popliteal to peroneal using reverse GSV in July 2017. She recently saw him on 3/23. In office ABIs were: Right 0.48 with monophasic flow to the DP, PT, peroneal Left 0.63 with monophasic flow to the DP and PT Her arterial studies indicated a patent right below knee popliteal to peroneal bypass She had an MRI in February 2008 that was negative frosty myelitis but this showed general soft tissue edema in the right foot and lower extremity concerning for cellulitis She is a diabetic, managed with insulin. Her hemoglobin A1c in December 2017 was 8.4 which is a trend up from previous levels. She had blood work in February 2018 which revealed an albumin of 2.6 this appears to be relatively acute as an albumin in November 2017 was 3.7 11/13/16; this is a patient I have not seen since February who is readmitted to our clinic last week. She is a type II diabetic on insulin with known severe PAD status post revascularization in the left leg by [name] I have reviewed Dr. Lianne Moris [name] from March/23/18. Doppler ABI on that date showed an ABI on the right of 0.48 and on the left of 0.63. Dorsalis pedis waveforms were monophasic bilaterally. There was no waveforms detected at the posterior tibial on the right, monophasic on the left. Dr. Lianne Moris comments were that this patient would have follow-up vascular studies in 3 months including ABIs and right lower extremity arterial duplex. She had an MRI in February that was negative for osteomyelitis but showed generalized edema in [name], [name] ([id number]) the foot. Last albumin I see was in January at 3.4 we have been using Santyl to the 4 wounds on the right leg the patient is noted today to have widespread edema well up towards her groin this is pitting 2-3+. 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 Normal ejection fraction. 11/20/16; patient has a follow-up  appointment with [name] on April 23. She is still complaining of a lot of pain in the right foot and right leg. It is not clear to me that this is at all positional however I think it is clear claudication with minimal activity perhaps at rest. At our suggestion she is return to her primary physician's office tomorrow with regards to her pitting lower extremity bilateral edema that I reviewed in detail last  week 11/27/16; the follow-up with [name] was actually on May 23 on April 23 as I stated in my note last week. N/A case all of her wounds seems somewhat smaller. The 2 on the right leg are definitely smaller. The areas on the dorsal right first toe, right third toe and the lateral part of the right fifth metatarsal head all looks smaller but have tightly adherent surfaces. We have been using Santyl 12/04/16; follow-up with [name] on May 23. 2 small open wounds on the right leg continue to get smaller. The area on the right third total lateral aspect of the right fifth toe also look better. The remaining area on the dorsal first toe still has some depth to it. We have been using Santyl to the toes and collagen on the right 12/11/16; according to patient's husband the follow-up with [name] is not until July. All of the wounds on the right leg are measuring smaller. We have been using some combination of Prisma and Santyl although I think we can go to straight Prisma today. There may have been some confusion with home health about the primary dressing orders here. 12/25/16; the patient has had some healing this week. The area on her right lateral fifth metatarsal head, right third toe are both healed lower right leg is healed. In the vein harvest site superiorly she has one superficial open area. On the dorsal aspect of her right great toe/MTP joint the wound is now divided into 2 however the proximal area is deep and there is palpable bone 01/01/17; still an open area in the middle of her original  right surgical scar. The area on the right third toe and right lateral fifth metatarsal head remained closed. Problematic area on the dorsal aspect of the toe. Previous surgery in this area line 01/08/17 small open area on the original scar on her upper anterior leg although this is closing. X-ray I did of the right first toe did not show underlying bony abnormality. Still this area on the dorsal first toe probes to bone. We have been using Prisma 01/15/17; small open area in the original scar in the upper anterior leg is almost fully closed. She has 2 open areas over the dorsal aspect of the right first toe that probes to bone. Used and a form starting last week. Vigorous bone scraping that I did last week showed few methicillin sensitive staph aureus. She is allergic to penicillin and sulfa drugs. I'm going to give her 2 weeks of doxycycline. She will need an MRI with contrast. 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 She continues on doxycycline for a bone scraping that showed methicillin sensitive staph aureus [allergic to penicillin and sulfa]. We have been using Endo form to the wound. 01/29/17; surprisingly her MRI did not show osteomyelitis. She continues on doxycycline for a bone scraping that showed methicillin sensitive staph aureus with allergies to penicillin and sulfa. We have been using Endo form to the wound. Unfortunately I cannot get a surface on this visit looks like it is able to support a healing state. Proximally there is still exposed bone. There is no overt soft tissue tenderness. Her MRI did show a previous fracture was surgery to this area but no hardware 02/12/17 on evaluation today patient appears to be doing well in regard to her lower extremity wound. She does have some mild discomfort but  this is minimal. There's no evidence of infection. Her left great toe nail has somewhat been lifted up  and there is a little bit of slight bleeding underneath but this is still firmly attached. 02/19/17; she ran out of doxycycline 2 days ago. Nevertheless I would like to continue this for 2 weeks to make a full 6 weeks of therapy as the bone scraping that I did from the open area on the dorsal right first toe showed MRSA. This should complete antibiotic therapy. 02/26/17; the patient is completing her 6 weeks of oral doxycycline. Deep wound on the dorsal right toe. This still has some exposed bone proximally. She does have a reasonably granulated surface albeit thin over bone. I've been using Endo form have made application for an amniotic skin sub 03/05/17; the patient has completed 6 weeks of doxycycline. This is a deep wound on the dorsal right toe which has exposed bone proximally. She has granulation over most of this wound albeit a thin layer. I've been using Endo form. Still do not have approval for [employer] 03/13/17 on evaluation today patient's right foot wound appears to be doing better measurement wise compared to her last evaluation. She has been tolerating the dressing change without complication and does have a appointment with the dietary that has been made as far as referral is concerned there just waiting for her and transportation to contact them back for an actual date. Nonetheless patient has been having less pain at this point. No fevers, chills, nausea, or vomiting noted at this time. 03/26/17; linear wound over the dorsal aspect of her right great toe. At one point this had exposed bone on the most superior aspect however I am pleased to see today that this appears to have a surface of granulation. Apparently product was applied for but denied by [facility]. We'll need to see what that was. The patient has known PAD status post revascularization. MRI did not show osteomyelitis which was done in June 04/02/17; continued improvement using Endoform. She was approved for [employer]asis but hasn't  over $518 co-pay per application, Fancher, Missy V. ([id number]) this is beyond her means 04/09/17; continues on Endoform. 04/16/17; appears to be doing nicely continuing on Endoform 04/22/17; patient did not have her dressing changed all week because of the weather. Using Endoform. Base of the wound looks healthy elbow there appears to events surrounding maceration perhaps because of the drainage was not changing the wound dressing 04/30/17 wound today continues to close and except for a small divot on the superior part of the wound. This area did not probe the bone but I found this a little concerning as this was the area with exposed bone before we be able to get this to granulate forward. As I remember things she is not a candidate for skin substitutes secondary to an outlandish co-pay. I asked her husband to look into the out of pocket max for medications if he has 1 [Regranex] or totally for skin substitutes 05/07/17; the patient arrives today with a wound roughly the same size although under careful inspection under the light there appears to be some epithelialization. Her intake nurse noted that the Endoform seemed to be placed over the wound rather than in the deeper divots they could really benefit from the Endoform. At this point I have no plans to change the Endoform as at one point this was at least 33% exposed bone and the rest of the wound very close to the underlying phalanx 05/14/17; no major  change in the size or appearance of the wound. We have been using Endoform for a prolonged period of time with reasonably good improvement in the epithelialization i.e. no exposed bone especially proximally. Unless we've not made a lot of changes in the last several weeks. She does not complain of pain. She was revascularized early this year or perhaps late last year by [name] and I wonder if he will need to have a look at her, I will need to review the actual vascular procedure which I think was a  distal popliteal bypass 05/21/17; switched to either for a blue last week. Patient arrived in clinic with the wound not measuring any better but the surface looking some better. Unfortunately she had some surface slough and when I went to remove this superiorly she had exposed bone. Previously she had had exposed bone in the inferior part of the wound however this is granulated over some weeks ago. Clearly this is a major step back for her. With some difficulty I was able to obtain a piece of the bone probing through the superior part of the wound for culture. We did not send pathology. It is likely that she is going to need imaging of this site but I did not order this today 05/28/17; bone culture I took last week showed MSSA. She still has exposed bone however I could not get another piece for pathology. She had an MRI 4 months ago that did not show osteoarthritic at time. Wound rapidly deteriorated superiorly and I suspect this is underlying osteomyelitis. We have been using Hydrofera Blue 06/04/17 on evaluation today patient's wound appears to actually be doing a little bit better visually compared to last week's evaluation which is good news. Nonetheless she does have osteomyelitis of the toe which does not appear to be MRSA. No fevers, chills, nausea, or vomiting noted at this time. She is having no discomfort. 06/11/17; patient's wound looks a little healthier than I remember seeing it 2 weeks ago. There is no exposed bone and the surface of the wound appears well granulated. We've been using hydrofera blue. I note that she was started on doxycycline which was MSSA. Her appointment with Dr. Ola Spurr is on November 12 I believe. 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 Vallarie [name] of vascular surgery on 11/9. He offered right leg angiography possible intervention however the patient refused. I've discussed this with the patient's husband today she did not want any  further procedures. Also noteworthy that Dr. Vallarie [name] stated that this wound had not healed in 3 years, I was not aware of this degree of chronicity in this area. She has underlying osteomyelitis by bone culture. Culture of this grew MSSA, she is allergic to penicillin and therefore not a candidate for beta lactams. She saw Dr. Ola Spurr of infectious disease this morning, he continued her on doxycycline for another month and apparently sent in a prescription. We have been using Hydrofera Blue. ABI's update by [name] right 0.62, left 0.89. Monophasic wave forms Objective Constitutional Patient is hypertensive.. Pulse regular and within target range for patient.[name] Respirations regular, non-labored and within target range.. Temperature is normal and within the target range for the patient.[name] appears in no distress. Gorey, Amandalynn V. ([id number]) Vitals Time Taken: 12:43 PM, Height: 64 in, Weight: 200 lbs, BMI: 34.3, Temperature: 98.1 F, Pulse: 79 bpm, Respiratory Rate: 16 breaths/min, Blood Pressure: 167/59 mmHg. General [name]: Wound exam; the wound is on the dorsal aspect of her right  great toe. There is still exposed bone and a small area however most of the wound is covered with adherent necrotic material. Using #3 curet this is debrided off. Hemostasis with direct pressure and relieve the surface doesn't look so bad although is noted there is a small hole that probes to bone. The stasis with direct pressure Integumentary (Hair, Skin) Wound #7 status is Open. Original cause of wound was Gradually Appeared. The wound is located on the Right,Dorsal Metatarsal head first. The wound measures 1.8cm length x 0.7cm width x 0.3cm depth; 0.99cm^2 area and 0.297cm^3 volume. There is Fat Layer (Subcutaneous Tissue) Exposed exposed. There is no tunneling or undermining noted. There is a medium amount of serous drainage noted. The wound margin is flat and intact. There is medium (34-66%) red granulation within  the wound bed. There is a medium (34-66%) amount of necrotic tissue within the wound bed including Adherent Slough. The periwound skin appearance exhibited: Induration, Hemosiderin Staining. The periwound skin appearance did not exhibit: Callus, Crepitus, Excoriation, Rash, Scarring, Dry/Scaly, Maceration, Atrophie Blanche, Cyanosis, Ecchymosis, Mottled, Pallor, Rubor, Erythema. The periwound has tenderness on palpation. 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 There was a Skin/Subcutaneous Tissue Debridement ([id number]) debridement with total area of 1.26 sq cm performed by [name] Dillon, MD. with the following instrument(s): Curette to remove Viable and Non-Viable tissue/material including Fibrin/Slough, Eschar, and Subcutaneous after achieving pain control using Other (lidocaine 4%). A time out was conducted at 01:05, prior to the start of the procedure. A Moderate amount of bleeding was controlled with Pressure. The procedure was tolerated well with a pain level of 0 throughout and a pain level of 0 following the procedure. Post Debridement Measurements: 1.8cm length x 0.7cm width x 0.3cm  depth; 0.297cm^3 volume. Character of Wound/Ulcer Post Debridement is stable. Severity of Tissue Post Debridement is: Fat layer exposed. Post procedure Diagnosis Wound #7: Same as Pre-Procedure Hurrell, Leslieann V. ([id number]) Plan Wound Cleansing: Wound #7 Right,Dorsal Metatarsal head first: Clean wound with Normal Saline. Cleanse wound with mild soap and water Anesthetic: Wound #7 Right,Dorsal Metatarsal head first: Topical Lidocaine 4% cream applied to wound bed prior to debridement Primary Wound Dressing: Wound #7 Right,Dorsal Metatarsal head first: Hydrafera Blue Secondary Dressing: Wound #7 Right,Dorsal Metatarsal head first: ABD pad Conform/Kerlix Dressing Change Frequency: Wound #7 Right,Dorsal Metatarsal head first: Change Dressing Monday, Wednesday, Friday Follow-up Appointments: Wound #7 Right,Dorsal Metatarsal head first: Return Appointment in 1 week. Edema Control: Wound #7 Right,Dorsal Metatarsal head first: Elevate legs to the level of the heart and pump ankles as often as possible Additional Orders / Instructions: Wound #7 Right,Dorsal Metatarsal head first: Increase protein intake. Activity as tolerated Home Health: Wound #7 Right,Dorsal Metatarsal head first: [place] [facility] weekly, [facility]  Home Health Nurse may visit PRN to address patient s wound care needs. FACE TO FACE ENCOUNTER: MEDICARE and MEDICAID PATIENTS: I certify that this patient is under my care and that I had a face-to-face encounter that meets the physician face-to-face encounter requirements with this patient on this date. The encounter with the patient was in whole or in part for the following MEDICAL CONDITION: (primary reason for [place]) MEDICAL NECESSITY: I certify, that based on my findings, NURSING services are a medically necessary home health service. HOME BOUND STATUS: I certify that my clinical findings support that this patient  is homebound (i.e., Due to illness or injury, pt requires aid of supportive devices such as crutches, cane, wheelchairs, walkers, the use of special transportation or the assistance of another person to leave their place of residence. There is a normal inability to leave the home and doing so requires considerable and taxing effort. Other absences are for medical reasons / religious services and are infrequent or of short duration when for other reasons). If current dressing causes regression in wound condition, may D/C ordered dressing product/s and apply Normal Saline Moist Dressing daily until next [place] / Other MD appointment. Bru[place] of regression in wound condition at [phone]. Please direct any NON-WOUND related issues/requests for orders to patient's Primary Care Physician Medications-please add to medication list.: Wound #7 Right,Dorsal Metatarsal head first: P.O. Antibiotics - continue antibiotics as prescribed General [name]: Apligraft authorization Cohill, Lizzie V. ([id number]) #1 doxycycline for another month as prescribed by Dr. Blane Ohara morning per discussion with her husband although I have not actually seen this note #2 she declined any further attempt at revascularization by [name] #3 [name] makes [name] of this wound being present for 3 years, I was not aware of this degree of chronicity #4 in spite of 1-3 wound actually looks fairly good after debridement. We are going to run an Apligraf through her insurance. If we have any chance of getting closure on this will be with Apligraf. Graphics would be my second choice 5 continue hyrofera blue for now as the primary dressing Electronic Signature(s) Signed: 06/18/2017 1:32:59 PM By: [name] Ham MD Entered By: [name] on 06/18/2017 13:32:59 Veith, Mer Rouge ([id number]) -------------------------------------------------------------------------------- SuperBill  Details Patient Name: Sokoloski, [place] V. Date of Service: 06/18/2017 Medical Record Number: [id number] Patient Account Number: [id number] Date of Birth/Sex: 01/01/1941 (76 y.o. 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 I70.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, right ankle and foot Facility Procedures CPT4 Code Description: [id number] 11042 - DEB SUBQ TISSUE 20 SQ CM/< ICD-10 Diagnosis Description L97.519 Non-pressure chronic ulcer of other part of right foot with unspe E11.621 Type 2 diabetes mellitus with foot ulcer Modifier: cified severit Quantity: 1 y Physician Procedures CPT4 Code Description: [id number] 11042 - WC PHYS SUBQ TISS 20 SQ CM ICD-10 Diagnosis Description L97.519 Non-pressure chronic ulcer of other part of right  foot with unspec E11.621 Type 2 diabetes mellitus with foot ulcer Modifier: ified severit Quantity: 1 y Engineer, maintenance) Signed: 06/18/2017 4:36:42 PM By: [name] Ham MD Entered By: [name] on 06/18/2017 13:29:59

## 2017-06-19 NOTE — Addendum Note (Signed)
Addended by: Lianne Cure A on: 06/19/2017 02:40 PM   Modules accepted: Orders

## 2017-06-19 NOTE — Progress Notes (Signed)
GRAINNE, KNIGHTS ([id number]) Visit Report for 06/18/2017 Arrival Information Details Patient Name: [name], [name] Date of Service: 06/18/2017 12:45 PM Medical Record Number: [id number] Patient Account Number: [id number] Date of Birth/Sex: 04-30-1941 (76 y.o. Female) Treating RN: [name] Primary Care [name]: [name] Other Clinician: Referring [name]: [name] Treating Mersadez Linden/Extender: [name] in Treatment: 5 Visit Information History Since Last Visit All ordered tests and consults were completed: No Patient Arrived: Wheel Chair Added or deleted any medications: No Arrival Time: 12:32 Any new allergies or adverse reactions: No Accompanied By: husband Had a fall or experienced change in No activities of daily living that may affect Transfer Assistance: None risk of falls: Patient Identification Verified: Yes Signs or symptoms of abuse/neglect since last visito No Secondary Verification Process Completed: Yes Hospitalized since last visit: No Patient Requires Transmission-Based No Pain Present Now: No Precautions: Patient Has Alerts: No Electronic Signature(s) Signed: 06/18/2017 4:44:57 PM By: [name] Entered By: [name] on 06/18/2017 12:43:33 [name] ([id number]) -------------------------------------------------------------------------------- Encounter Discharge Information Details Patient Name: [name], [name] Date of Service: 06/18/2017 12:45 PM Medical Record Number: [id number] Patient Account Number: [id number] Date of Birth/Sex: April 28, 1941 (76 y.o. Female) Treating RN: [name] Primary Care Khelani Kops: [name] Other Clinician: Referring [name]: [name] Treating [name]/Extender: [name] in Treatment: 44 Encounter Discharge Information Items Schedule Follow-up Appointment: No Medication Reconciliation completed and No provided to Patient/Care Zeric Baranowski: Provided on Clinical  Summary of Care: 06/18/2017 Form Type Recipient Paper Patient [facility] Electronic Signature(s) Signed: 06/18/2017 4:27:25 PM By: [name] Entered By: [name] on 06/18/2017 13:28:44 [name], [name] ([id number]) -------------------------------------------------------------------------------- Lower Extremity Assessment Details Patient Name: [name], [name] Date of Service: 06/18/2017 12:45 PM Medical Record Number: [id number] Patient Account Number: [id number] Date of Birth/Sex: 10-03-1940 (76 y.o. Female) Treating RN: [name] Primary Care Trini Soldo: [name] Other Clinician: Referring [name]: [name] Treating Rosalea Withrow/Extender: [name] in Treatment: 32 Edema Assessment Assessed: [Left: No] [Right: No] Edema: [Left: Ye] [Right: s] Vascular Assessment Claudication: Claudication Assessment [Right:None] Pulses: Dorsalis Pedis Palpable: [Right:No] Doppler Audible: [Right:Inaudible] Posterior Tibial Extremity colors, hair growth, and conditions: Extremity Color: [Right:Hyperpigmented] Hair Growth on Extremity: [Right:No] Temperature of Extremity: [Right:Warm] Capillary Refill: [Right:> 3 seconds] Toe Nail Assessment Left: Right: Thick: Yes Discolored: Yes Deformed: Yes Improper Length and Hygiene: Yes Electronic Signature(s) Signed: 06/18/2017 4:44:57 PM By: [name] Entered By: [name] on 06/18/2017 12:54:03 [name], [name] ([id number]) -------------------------------------------------------------------------------- Multi Wound Chart Details Patient Name: [name], [name] Date of Service: 06/18/2017 12:45 PM Medical Record Number: [id number] Patient Account Number: [id number] Date of Birth/Sex: [birth year] (76 y.o. Female) Treating RN: [name] Primary Care Daliya Parchment: [name] Other Clinician: Referring [name]: [name] Treating [name]/Extender: [name] in Treatment: 32 Vital  Signs Height(in): 64 Pulse(bpm): 36 Weight(lbs): 200 Blood Pressure(mmHg): 167/59 Body Mass Index(BMI): 34 Temperature(F): 98.1 Respiratory Rate 16 (breaths/min): Photos: [N/A:N/A] Wound Location: Right Metatarsal head first - N/A N/A Dorsal Wounding Event: Gradually Appeared N/A N/A Primary Etiology: Diabetic Wound/Ulcer of the N/A N/A Lower Extremity Comorbid History: Cataracts, Chronic sinus N/A N/A problems/congestion, Congestive Heart Failure, Hypertension, Type II Diabetes Date Acquired: 08/06/2013 N/A N/A Weeks of Treatment: 32 N/A N/A Wound Status: Open N/A N/A Measurements L x W x D 1.8x0.7x0.3 N/A N/A (cm) Area (cm) : 0.99 N/A N/A Volume (cm) : 0.297 N/A N/A % Reduction in Area: 16.00% N/A N/A % Reduction in Volume: 15.90% N/A N/A Classification: Grade 2 N/A N/A  Exudate Amount: Medium N/A N/A Exudate Type: Serous N/A N/A Exudate Color: amber N/A N/A Wound Margin: Flat and Intact N/A N/A Granulation Amount: Medium (34-66%) N/A N/A Granulation Quality: Red N/A N/A Necrotic Amount: Medium (34-66%) N/A N/A Exposed Structures: Fat Layer (Subcutaneous N/A N/A Tissue) Exposed: Yes Fascia: No Tendon: No Muscle: No [name], [name] ([id number]) Joint: No Bone: No Epithelialization: Small (1-33%) N/A N/A Debridement: Debridement ([id number]) N/A N/A Pre-procedure 01:05 N/A N/A Verification/Time Out Taken: Pain Control: Other N/A N/A Tissue Debrided: Necrotic/Eschar, N/A N/A Fibrin/Slough, Subcutaneous Level: Skin/Subcutaneous Tissue N/A N/A Debridement Area (sq cm): 1.26 N/A N/A Instrument: Curette N/A N/A Bleeding: Moderate N/A N/A Hemostasis Achieved: Pressure N/A N/A Procedural Pain: 0 N/A N/A Post Procedural Pain: 0 N/A N/A Debridement Treatment Procedure was tolerated well N/A N/A Response: Post Debridement 1.8x0.7x0.3 N/A N/A Measurements L x W x D (cm) Post Debridement Volume: 0.297 N/A N/A (cm) Periwound Skin Texture: Induration: Yes N/A  N/A Excoriation: No Callus: No Crepitus: No Rash: No Scarring: No Periwound Skin Moisture: Maceration: No N/A N/A Dry/Scaly: No Periwound Skin Color: Hemosiderin Staining: Yes N/A N/A Atrophie Blanche: No Cyanosis: No Ecchymosis: No Erythema: No Mottled: No Pallor: No Rubor: No Tenderness on Palpation: Yes N/A N/A Wound Preparation: Ulcer Cleansing: N/A N/A Rinsed/Irrigated with Saline Topical Anesthetic Applied: Other: lidocaine 4% Procedures Performed: Debridement N/A N/A Treatment Notes Electronic Signature(s) Signed: 06/18/2017 4:36:42 PM By: Linton Ham MD Entered By: Linton Ham on 06/18/2017 13:22:33 Fiorello, [name] ([id number]) -------------------------------------------------------------------------------- Multi-Disciplinary Care Plan Details Patient Name: [name], [name] Date of Service: 06/18/2017 12:45 PM Medical Record Number: [id number] Patient Account Number: [id number] Date of Birth/Sex: Nov 26, 1940 (76 y.o. Female) Treating RN: [name] Primary Care Vallerie Hentz: [name] Other Clinician: Referring Takoda Janowiak: [name] Treating Ermalee Mealy/Extender: [name] in Treatment: 32 Active Inactive ` Abuse / Safety / Falls / Self Care Management Nursing Diagnoses: Impaired physical mobility Potential for falls Goals: Patient will remain injury free Date Initiated: 11/06/2016 Target Resolution Date: 12/30/2016 Goal Status: Active Patient/caregiver will verbalize understanding of skin care regimen Date Initiated: 11/06/2016 Target Resolution Date: 12/30/2016 Goal Status: Active Interventions: Assess fall risk on admission and as needed Treatment Activities: Patient referred to home care : 11/06/2016 Notes: ` Nutrition Nursing Diagnoses: Potential for alteratiion in Nutrition/Potential for imbalanced nutrition Goals: Patient/caregiver verbalizes understanding of need to maintain therapeutic glucose control per primary care  physician Date Initiated: 11/06/2016 Target Resolution Date: 12/30/2016 Goal Status: Active Interventions: Provide education on elevated blood sugars and impact on wound healing Notes: ` Orientation to the Wound Care Program Nursing Diagnoses: Knowledge deficit related to the wound healing center program Venable, GAILENE YOUKHANA ([id number]) Goals: Patient/caregiver will verbalize understanding of the [place] Date Initiated: 11/06/2016 Target Resolution Date: 12/30/2016 Goal Status: Active Interventions: Provide education on orientation to the wound center Notes: ` Venous Leg Ulcer Nursing Diagnoses: Actual venous Insuffiency (use after diagnosis is confirmed) Knowledge deficit related to disease process and management Goals: Non-invasive venous studies are completed as ordered Date Initiated: 11/06/2016 Target Resolution Date: 12/30/2016 Goal Status: Active Patient/caregiver will verbalize understanding of disease process and disease management Date Initiated: 11/06/2016 Target Resolution Date: 12/30/2016 Goal Status: Active Interventions: Assess peripheral edema status every visit. Notes: ` Wound/Skin Impairment Nursing Diagnoses: Impaired tissue integrity Knowledge deficit related to smoking impact on wound healing Knowledge deficit related to ulceration/compromised skin integrity Goals: Ulcer/skin breakdown will heal within 14 weeks Date Initiated: 11/06/2016 Target Resolution Date: 02/05/2017 Goal Status: Active Interventions: Assess ulceration(s)  every visit Treatment Activities: Skin care regimen initiated : 11/06/2016 Notes: Electronic Signature(s) Signed: 06/18/2017 4:44:57 PM By: Denzil Hughes ([id number]) Entered By: [name] on 06/18/2017 12:54:25 [name], [name] ([id number]) -------------------------------------------------------------------------------- Pain Assessment Details Patient Name: Heidelberger, Korynn V. Date of Service:  06/18/2017 12:45 PM Medical Record Number: [id number] Patient Account Number: [id number] Date of Birth/Sex: 1941-03-12 (76 y.o. Female) Treating RN: [name] Primary Care Ridwan Bondy: [name] Other Clinician: Referring Kamayah Pillay: [name] Treating Lucero Auzenne/Extender: [name] in Treatment: 32 Active Problems Location of Pain Severity and Description of Pain Patient Has Paino No Site Locations Pain Management and Medication Current Pain Management: Electronic Signature(s) Signed: 06/18/2017 4:44:57 PM By: [name] Entered By: [name] on 06/18/2017 12:43:46 Meharg, [name] [name] ([id number]) -------------------------------------------------------------------------------- Patient/Caregiver Education Details Patient Name: [name], [name] Date of Service: 06/18/2017 12:45 PM Medical Record Number: [id number] Patient Account Number: [id number] Date of Birth/Gender: 30-Sep-1940 (76 y.o. Female) Treating RN: [name] Primary Care Physician: [name] Other Clinician: Referring Physician: Beverlyn [name] Treating Physician/Extender: [name] in Treatment: 78 Education Assessment Education Provided To: Patient Education Topics Provided Wound/Skin Impairment: Handouts: Caring for Your Ulcer Methods: Explain/Verbal Responses: State content correctly Electronic Signature(s) Signed: 06/18/2017 4:44:57 PM By: [name] Entered By: [name] on 06/18/2017 13:29:11 [name], [name] ([id number]) -------------------------------------------------------------------------------- Wound Assessment Details Patient Name: Bucio, Melizza V. Date of Service: 06/18/2017 12:45 PM Medical Record Number: [id number] Patient Account Number: [id number] Date of Birth/Sex: 06/26/1941 (76 y.o. Female) Treating RN: [name] Primary Care Manoj Enriquez: [name] Other Clinician: Referring Halen Antenucci: [name] Treating Emanie Behan/Extender:  [name] in Treatment: 32 Wound Status Wound Number: 7 Primary Diabetic Wound/Ulcer of the Lower Extremity Etiology: Wound Location: Right Metatarsal head first - Dorsal Wound Open Wounding Event: Gradually Appeared Status: Date Acquired: 08/06/2013 Comorbid Cataracts, Chronic sinus problems/congestion, Weeks Of Treatment: 32 History: Congestive Heart Failure, Hypertension, Type II Clustered Wound: No Diabetes Photos Wound Measurements Length: (cm) 1.8 Width: (cm) 0.7 Depth: (cm) 0.3 Area: (cm) 0.99 Volume: (cm) 0.297 % Reduction in Area: 16% % Reduction in Volume: 15.9% Epithelialization: Small (1-33%) Tunneling: No Undermining: No Wound Description Classification: Grade 2 Wound Margin: Flat and Intact Exudate Amount: Medium Exudate Type: Serous Exudate Color: amber Foul Odor After Cleansing: No Slough/Fibrino Yes Wound Bed Granulation Amount: Medium (34-66%) Exposed Structure Granulation Quality: Red Fascia Exposed: No Necrotic Amount: Medium (34-66%) Fat Layer (Subcutaneous Tissue) Exposed: Yes Necrotic Quality: Adherent Slough Tendon Exposed: No Muscle Exposed: No Joint Exposed: No Bone Exposed: No Periwound Skin Texture Texture Color No Abnormalities Noted: No No Abnormalities Noted: No Callus: No Atrophie Blanche: No Bolte, Rufina V. ([id number]) Crepitus: No Cyanosis: No Excoriation: No Ecchymosis: No Induration: Yes Erythema: No Rash: No Hemosiderin Staining: Yes Scarring: No Mottled: No Pallor: No Moisture Rubor: No No Abnormalities Noted: No Dry / Scaly: No Temperature / Pain Maceration: No Tenderness on Palpation: Yes Wound Preparation Ulcer Cleansing: Rinsed/Irrigated with Saline Topical Anesthetic Applied: Other: lidocaine 4%, Treatment Notes Wound #7 (Right, Dorsal Metatarsal head first) 1. Cleansed with: Clean wound with Normal Saline 2. Anesthetic Topical Lidocaine 4% cream to wound bed prior to  debridement 4. Dressing Applied: Hydrafera Blue 5. Secondary Dressing Applied ABD Pad Dry Gauze Notes heel foam Electronic Signature(s) Signed: 06/18/2017 4:44:57 PM By: [name] Entered By: [name] on 06/18/2017 12:50:55 Arington, Arlisa V. ([id number]) -------------------------------------------------------------------------------- Vitals Details Patient Name: Bevans, Britnie V. Date of Service: 06/18/2017 12:45 PM Medical Record Number: [id number] Patient Account  Number: [id number] Date of Birth/Sex: [birth year] (76 y.o. Female) Treating RN: [name] Primary Care Kali Deadwyler: [name] Other Clinician: Referring Stevie Charter: [name] Treating Maddock Finigan/Extender: [name] in Treatment: 32 Vital Signs Time Taken: 12:43 Temperature (F): 98.1 Height (in): 64 Pulse (bpm): 79 Weight (lbs): 200 Respiratory Rate (breaths/min): 16 Body Mass Index (BMI): 34.3 Blood Pressure (mmHg): 167/59 Reference Range: 80 - 120 mg / dl Electronic Signature(s) Signed: 06/18/2017 4:44:57 PM By: [name] Entered By: [name] on 06/18/2017 12:44:12

## 2017-06-25 ENCOUNTER — Encounter: Payer: Medicare HMO | Admitting: Physician Assistant

## 2017-06-25 DIAGNOSIS — E11622 Type 2 diabetes mellitus with other skin ulcer: Secondary | ICD-10-CM | POA: Diagnosis not present

## 2017-06-26 NOTE — Progress Notes (Signed)
White, [name] ([id number]) Visit Report for 06/25/2017 Chief Complaint Document Details Patient Name: [name], [name] Date of Service: 06/25/2017 10:15 AM Medical Record Number: [id number] Patient Account Number: [id number] Date of Birth/Sex: 1940-10-12 (76 y.o. Female) Treating RN: Roger Shelter Primary Care Provider: Beverlyn Roux Other Clinician: Referring Provider: Beverlyn Roux Treating Provider/Extender: Melburn Hake, Miko Markwood Weeks in Treatment: 18 Information Obtained from: Patient Chief Complaint The patient is here for initial evaluation of RLE and Right foot/toe ulcers Electronic Signature(s) Signed: 06/25/2017 4:01:13 PM By: Worthy Keeler PA-C Entered By: Worthy Keeler on 06/25/2017 10:29:22 Barcelo, Harlie [name] ([id number]) -------------------------------------------------------------------------------- Debridement Details Patient Name: White, [name] Date of Service: 06/25/2017 10:15 AM Medical Record Number: [id number] Patient Account Number: [id number] Date of Birth/Sex: 04-13-1941 (77 y.o. Female) Treating RN: Roger Shelter Primary Care Provider: Beverlyn Roux Other Clinician: Referring Provider: Beverlyn Roux Treating Provider/Extender: STONE III, Jahiem Franzoni Weeks in Treatment: 33 Debridement Performed for Wound #7 Right,Dorsal Metatarsal head first Assessment: Performed By: Physician STONE III, Dj Senteno E., PA-C Debridement: Debridement Severity of Tissue Pre Fat layer exposed Debridement: Pre-procedure Verification/Time Yes - 10:35 Out Taken: Start Time: 10:35 Pain Control: Other : lidocaine 4% Level: Skin/Subcutaneous Tissue Total Area Debrided (L x W): 1.9 (cm) x 0.6 (cm) = 1.14 (cm) Tissue and other material Viable, Non-Viable, Eschar, Fibrin/Slough, Subcutaneous debrided: Instrument: Curette Bleeding: Moderate Hemostasis Achieved: Pressure End Time: 10:36 Procedural Pain: 0 Post Procedural Pain: 0 Response to Treatment: Procedure was tolerated well Post  Debridement Measurements of Total Wound Length: (cm) 1.9 Width: (cm) 0.6 Depth: (cm) 0.2 Volume: (cm) 0.179 Character of Wound/Ulcer Post Debridement: Stable Severity of Tissue Post Debridement: Fat layer exposed Post Procedure Diagnosis Same as Pre-procedure Electronic Signature(s) Signed: 06/25/2017 3:20:43 PM By: Roger Shelter Signed: 06/25/2017 4:01:13 PM By: Worthy Keeler PA-C Entered By: Roger Shelter on 06/25/2017 10:36:49 Eisenmenger, Gaynell V. ([id number]) -------------------------------------------------------------------------------- HPI Details Patient Name: White, [name] Date of Service: 06/25/2017 10:15 AM Medical Record Number: [id number] Patient Account Number: [id number] Date of Birth/Sex: 1941/06/02 (76 y.o. Female) Treating RN: Roger Shelter Primary Care Provider: Beverlyn Roux Other Clinician: Referring Provider: Beverlyn Roux Treating Provider/Extender: Melburn Hake, Candy Leverett Weeks in Treatment: 33 History of Present Illness HPI Description: 08/13/16: This is a 76 year old woman who came predominantly for review of 3 cm in diameter circular wound to the left anterior lateral leg. She was in the ER on 08/01/16 I reviewed their notes. There was apparently pus coming out of the wound at that time and the patient arrived requesting debridement which they don't do in the emergency room. Nevertheless I can't see that they did any x-rays. There were no cultures done. She is a type II diabetic and I a note after the patient was in the clinic that she had a bypass graft from the popliteal to the tibial on the right on 02/28/16. She also had a right greater saphenous vein harvest on the same date for arterial bypass. She is going to have vascular studies including ABIs T ABIs on the right on 08/28/16. The patient's surgery was on 02/28/16 by Dr. Vallarie Mare she had a right below the knee popliteal artery to peroneal artery bypass with reverse greater saphenous vein and an endarterectomy of  the mid segment peroneal artery. Postoperatively she had a strong mild monophasic peroneal signal with a pink foot. It would appear that the patient is had some nonhealing in the surgical saphenous vein harvest site on the left leg. Surprisingly looking [facility] I cannot see much  information about this at all. Dr. Lucious Groves notes from 05/29/16 show that the patient's wounds "are not healed" the right first metatarsal wound healed but then opened back up. 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 At that point the patient had a wound VAC to her right calf with regards to the wounds on her dorsal right toe would appear that these are felt to be arterial wounds. She has had surgery on the metatarsal phalangeal in 2015 I Dr. Doran Durand secondary to a right metatarsal phalangeal joint fracture. She is apparently had discoloration around this area since then. 08/28/16; patient arrives with her wounds in much the same condition. The linear vein harvest site and the circular wound below it which I think was a blister. She also has to probing holes in her right great toe and a necrotic eschar on the right second toe. Because of these being arterial wounds I reduced her compression from 3-2 layers this seems to of done satisfactorily she has not had any problems. I cannot see that she is actually had an x-ray ====== 11/06/16 the patient comes in for evaluation of her right lower extremity ulcers. She was here in January 2018 for 2 visits subsequently ended up in the hospital with pneumonia and then to rehabilitation. She has now been discharged from rehabilitation and is home. She has multiple ulcerations to her right lower extremity including the foot and toes. She does have home health in place and they have been placing alginate to the ulcers. She is followed by Dr. Bridgett Larsson of vascular medicine. She  is status post a bypass graft to the right below knee popliteal to peroneal using reverse GSV in July 2017. She recently saw him on 3/23. In office ABIs were: Right 0.48 with monophasic flow to the DP, PT, peroneal Left 0.63 with monophasic flow to the DP and PT Her arterial studies indicated a patent right below knee popliteal to peroneal bypass She had an MRI in February 2008 that was negative frosty myelitis but this showed general soft tissue edema in the right foot and lower extremity concerning for cellulitis She is a diabetic, managed with insulin. Her hemoglobin A1c in December 2017 was 8.4 which is a trend up from previous levels. She had blood work in February 2018 which revealed an albumin of 2.6 this appears to be relatively acute as an albumin in November 2017 was 3.7 11/13/16; this is a patient I have not seen since February who is readmitted to our clinic last week. 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 Bridgett Larsson. I have reviewed Dr. Lianne Moris notes from March/23/18. Doppler ABI on that date showed an ABI on the right of 0.48 and on the left of 0.63. Dorsalis pedis waveforms were monophasic bilaterally. There was no waveforms detected at the posterior tibial on the right, monophasic on the left. Dr. Lianne Moris comments were that this patient would have follow-up vascular studies in 3 months including ABIs and right lower extremity arterial duplex. She had an MRI in February that was negative for osteomyelitis but showed generalized edema in the foot. Last albumin I see was in January at 3.4 we have been using Santyl to the 4 wounds on the right leg Wentland, Enaya V. ([id number]) the patient is noted today to have widespread edema well up towards her groin this is pitting  2-3+. 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 Normal ejection fraction. 11/20/16; patient has a  follow-up appointment with Dr. Bridgett Larsson on April 23. She is still complaining of a lot of pain in the right foot and right leg. It is not clear to me that this is at all positional however I think it is clear claudication with minimal activity perhaps at rest. At our suggestion she is return to her primary physician's office tomorrow with regards to her pitting lower extremity bilateral edema that I reviewed in detail last week 11/27/16; the follow-up with Dr. Bridgett Larsson was actually on May 23 on April 23 as I stated in my note last week. N/A case all of her wounds seems somewhat smaller. The 2 on the right leg are definitely smaller. The areas on the dorsal right first toe, right third toe and the lateral part of the right fifth metatarsal head all looks smaller but have tightly adherent surfaces. We have been using Santyl 12/04/16; follow-up with Dr. Bridgett Larsson on May 23. 2 small open wounds on the right leg continue to get smaller. The area on the right third total lateral aspect of the right fifth toe also look better. The remaining area on the dorsal first toe still has some depth to it. We have been using Santyl to the toes and collagen on the right 12/11/16; according to patient's husband the follow-up with Dr. Bridgett Larsson is not until July. All of the wounds on the right leg are measuring smaller. We have been using some combination of Prisma and Santyl although I think we can go to straight Prisma today. There may have been some confusion with home health about the primary dressing orders here. 12/25/16; the patient has had some healing this week. The area on her right lateral fifth metatarsal head, right third toe are both healed lower right leg is healed. In the vein harvest site superiorly she has one superficial open area. On the dorsal aspect of her right great toe/MTP joint the wound is now divided into 2 however the proximal area is deep and there is palpable bone 01/01/17; still an open area in the middle of her  original right surgical scar. The area on the right third toe and right lateral fifth metatarsal head remained closed. Problematic area on the dorsal aspect of the toe. Previous surgery in this area line 01/08/17 small open area on the original scar on her upper anterior leg although this is closing. X-ray I did of the right first toe did not show underlying bony abnormality. Still this area on the dorsal first toe probes to bone. We have been using Prisma 01/15/17; small open area in the original scar in the upper anterior leg is almost fully closed. She has 2 open areas over the dorsal aspect of the right first toe that probes to bone. Used and a form starting last week. Vigorous bone scraping that I did last week showed few methicillin sensitive staph aureus. She is allergic to penicillin and sulfa drugs. I'm going to give her 2 weeks of doxycycline. She will need an MRI with contrast. 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 She continues on doxycycline for a bone scraping that showed methicillin sensitive staph aureus [allergic to penicillin and sulfa]. We have been using Endo form to the wound. 01/29/17; surprisingly her MRI did not show osteomyelitis.  She continues on doxycycline for a bone scraping that showed methicillin sensitive staph aureus with allergies to penicillin and sulfa. We have been using Endo form to the wound. Unfortunately I cannot get a surface on this visit looks like it is able to support a healing state. Proximally there is still exposed bone. There is no overt soft tissue tenderness. Her MRI did show a previous fracture was surgery to this area but no hardware 02/12/17 on evaluation today patient appears to be doing well in regard to her lower extremity wound. She does have some mild discomfort but this is minimal. There's no evidence of infection. Her left great toe nail has somewhat been lifted  up and there is a little bit of slight bleeding underneath but this is still firmly attached. 02/19/17; she ran out of doxycycline 2 days ago. Nevertheless I would like to continue this for 2 weeks to make a full 6 weeks of therapy as the bone scraping that I did from the open area on the dorsal right first toe showed MRSA. This should complete antibiotic therapy. 02/26/17; the patient is completing her 6 weeks of oral doxycycline. Deep wound on the dorsal right toe. This still has some exposed bone proximally. She does have a reasonably granulated surface albeit thin over bone. I've been using Endo form have made application for an amniotic skin sub 03/05/17; the patient has completed 6 weeks of doxycycline. This is a deep wound on the dorsal right toe which has exposed bone proximally. She has granulation over most of this wound albeit a thin layer. I've been using Endo form. Still do not have approval for Affinity 03/13/17 on evaluation today patient's right foot wound appears to be doing better measurement wise compared to her last evaluation. She has been tolerating the dressing change without complication and does have a appointment with the dietary that has been made as far as referral is concerned there just waiting for her and transportation to contact them back for an actual date. Nonetheless patient has been having less pain at this point. No fevers, chills, nausea, or vomiting noted at this time. 03/26/17; linear wound over the dorsal aspect of her right great toe. At one point this had exposed bone on the most superior aspect however I am pleased to see today that this appears to have a surface of granulation. Apparently product was applied for but denied by [facility]. We'll need to see what that was. The patient has known PAD status post revascularization. MRI did not show osteomyelitis which was done in June 04/02/17; continued improvement using Endoform. She was approved for [employer] but hasn't  over $063 co-pay per application, this is beyond her means Canlas, Hanin V. ([id number]) 04/09/17; continues on Endoform. 04/16/17; appears to be doing nicely continuing on Endoform 04/22/17; patient did not have her dressing changed all week because of the weather. Using Endoform. Base of the wound looks healthy elbow there appears to events surrounding maceration perhaps because of the drainage was not changing the wound dressing 04/30/17 wound today continues to close and except for a small divot on the superior part of the wound. This area did not probe the bone but I found this a little concerning as this was the area with exposed bone before we be able to get this to granulate forward. As I remember things she is not a candidate for skin substitutes secondary to an outlandish co-pay. I asked her husband to look into the out of pocket max for  medications if he has 1 [Regranex] or totally for skin substitutes 05/07/17; the patient arrives today with a wound roughly the same size although under careful inspection under the light there appears to be some epithelialization. Her intake nurse noted that the Endoform seemed to be placed over the wound rather than in the deeper divots they could really benefit from the Endoform. At this point I have no plans to change the Endoform as at one point this was at least 33% exposed bone and the rest of the wound very close to the underlying phalanx 05/14/17; no major change in the size or appearance of the wound. We have been using Endoform for a prolonged period of time with reasonably good improvement in the epithelialization i.e. no exposed bone especially proximally. Unless we've not made a lot of changes in the last several weeks. She does not complain of pain. She was revascularized early this year or perhaps late last year by Dr. Bridgett Larsson and I wonder if he will need to have a look at her, I will need to review the actual vascular procedure which I think was a  distal popliteal bypass 05/21/17; switched to either for a blue last week. Patient arrived in clinic with the wound not measuring any better but the surface looking some better. Unfortunately she had some surface slough and when I went to remove this superiorly she had exposed bone. Previously she had had exposed bone in the inferior part of the wound however this is granulated over some weeks ago. Clearly this is a major step back for her. With some difficulty I was able to obtain a piece of the bone probing through the superior part of the wound for culture. We did not send pathology. It is likely that she is going to need imaging of this site but I did not order this today 05/28/17; bone culture I took last week showed MSSA. She still has exposed bone however I could not get another piece for pathology. She had an MRI 4 months ago that did not show osteoarthritic at time. Wound rapidly deteriorated superiorly and I suspect this is underlying osteomyelitis. We have been using Hydrofera Blue 06/04/17 on evaluation today patient's wound appears to actually be doing a little bit better visually compared to last week's evaluation which is good news. Nonetheless she does have osteomyelitis of the toe which does not appear to be MRSA. No fevers, chills, nausea, or vomiting noted at this time. She is having no discomfort. 06/11/17; patient's wound looks a little healthier than I remember seeing it 2 weeks ago. There is no exposed bone and the surface of the wound appears well granulated. We've been using hydrofera blue. I note that she was started on doxycycline which was MSSA. Her appointment with Dr. Ola Spurr is on November 12 I believe. 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 Vallarie Mare of vascular surgery on 11/9. He offered right leg angiography possible intervention however the patient refused. I've discussed this with the patient's husband today she did not want any  further procedures. Also noteworthy that Dr. Vallarie Mare stated that this wound had not healed in 3 years, I was not aware of this degree of chronicity in this area. She has underlying osteomyelitis by bone culture. Culture of this grew MSSA, she is allergic to penicillin and therefore not a candidate for beta lactams. She saw Dr. Ola Spurr of infectious disease this morning, he continued her on doxycycline for another month and apparently sent in  a prescription. We have been using Hydrofera Blue. ABI's update by Dr. Bridgett Larsson right 0.62, left 0.89. Monophasic wave forms 06/25/17 on evaluation today patient appears to be doing well in regard to her right great toe wound. She is still taking the doxycycline as prescribed by Dr. Ola Spurr. The Hydrofera Blue Dressing's to be doing as well as anything has for her up to this point and we are still waiting on an insurance authorization for the [place]. She is having no pain which is good news. No fevers, chills, nausea, or vomiting noted at this time. Electronic Signature(s) Signed: 06/25/2017 4:01:13 PM By: Worthy Keeler PA-C Entered By: Worthy Keeler on 06/25/2017 10:42:25 Cosby, Adabella [name] ([id number]) -------------------------------------------------------------------------------- Physical Exam Details Patient Name: Magda, Inetta V. Date of Service: 06/25/2017 10:15 AM Medical Record Number: [id number] Patient Account Number: [id number] Date of Birth/Sex: 12-29-1940 (76 y.o. Female) Treating RN: Roger Shelter Primary Care Provider: Beverlyn Roux Other Clinician: Referring Provider: Beverlyn Roux Treating Provider/Extender: STONE III, Haruko Mersch Weeks in Treatment: 56 Constitutional Obese and well-hydrated in no acute distress. Respiratory normal breathing without difficulty. Psychiatric this patient is able to make decisions and demonstrates good insight into disease process. Alert and Oriented x 3. pleasant and cooperative. Notes Patient does have  pills granulation tissue noted in the wound bed there was some [facility] covering which required debridement and was performed today without complication. She had no discomfort. Electronic Signature(s) Signed: 06/25/2017 4:01:13 PM By: Worthy Keeler PA-C Entered By: Worthy Keeler on 06/25/2017 10:43:54 Tomasso, Barbarann [name] ([id number]) -------------------------------------------------------------------------------- Physician Orders Details Patient Name: Cristobal, Lenyx V. Date of Service: 06/25/2017 10:15 AM Medical Record Number: [id number] Patient Account Number: [id number] Date of Birth/Sex: [birth year] (76 y.o. 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 o Cleanse wound with mild soap and water Anesthetic Wound #7 Right,Dorsal Metatarsal head first o Topical Lidocaine 4% cream applied to wound bed prior to debridement Primary Wound Dressing Wound #7 Right,Dorsal Metatarsal head first o Hydrafera Blue Secondary Dressing Wound #7 Right,Dorsal Metatarsal head first o ABD pad o  Conform/Kerlix Dressing Change Frequency Wound #7 Right,Dorsal Metatarsal head first o Change Dressing Monday, Wednesday, Friday Follow-up Appointments Wound #7 Right,Dorsal Metatarsal head first o Return Appointment in 1 week. Edema Control Wound #7 Right,Dorsal Metatarsal head first Mcmurtry, Jonny V. ([id number]) o Elevate legs to the level of the heart and pump ankles as often as possible Additional Orders / Instructions Wound #7 Right,Dorsal Metatarsal head first o Increase protein intake. o Activity as tolerated Home Health Wound #7 Right,Dorsal Metatarsal head first o Continue Home Health Visits - [facility] weekly, [facility] o Home Health Nurse may visit PRN to address patientos wound care needs. o FACE TO FACE ENCOUNTER: MEDICARE and MEDICAID PATIENTS:  I certify that this patient is under my care and that I had a face-to-face encounter that meets the physician face-to-face encounter requirements with this patient on this date. The encounter with the patient was in whole or in part for the following MEDICAL CONDITION: (primary reason for [place]) MEDICAL NECESSITY: I certify, that based on my findings, NURSING services are a medically necessary home health service. HOME BOUND STATUS: I certify that my clinical findings support that this patient is homebound (i.e., Due to illness or injury, pt requires aid of supportive devices such as crutches, cane, wheelchairs, walkers, the use of special transportation or the assistance of another person to leave their place of residence. There is a normal inability to leave the home and doing so requires considerable and taxing effort. Other absences are for medical reasons / religious services and are infrequent or of short duration when for other reasons). o If current dressing causes regression in wound condition, may D/C ordered dressing product/s and apply Normal Saline Moist Dressing daily  until next [place] / Other MD appointment. [place] of regression in wound condition at [phone]. o Please direct any NON-WOUND related issues/requests for orders to patient's Primary Care Physician Medications-please add to medication list. Wound #7 Right,Dorsal Metatarsal head first o P.O. Antibiotics - continue antibiotics as prescribed Electronic Signature(s) Signed: 06/25/2017 3:20:43 PM By: Roger Shelter Signed: 06/25/2017 4:01:13 PM By: Worthy Keeler PA-C Entered By: Roger Shelter on 06/25/2017 10:58:17 Sholtz, Analysa V. ([id number]) -------------------------------------------------------------------------------- Problem List Details Patient Name: Armijo, Ayisha V. Date of Service: 06/25/2017 10:15 AM Medical Record Number: [id number] Patient Account Number: [id number] Date of Birth/Sex: 1941-02-14 (76 y.o. 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 ([id number]) -------------------------------------------------------------------------------- Progress Note Details Patient Name: Siddiqi, Xandrea V. Date of Service: 06/25/2017 10:15 AM Medical Record Number: [id number] Patient Account Number: [id number] Date of Birth/Sex: 10/25/1940 (76 y.o. Female) Treating RN: Roger Shelter Primary Care Provider: Beverlyn Roux Other Clinician: Referring Provider: Beverlyn Roux Treating Provider/Extender: Melburn Hake, Taejon Irani Weeks in Treatment: 55 Subjective Chief Complaint Information obtained from Patient The patient is here for initial evaluation of RLE and Right foot/toe ulcers History of Present Illness (HPI) 08/13/16: This is a 76 year old woman who came predominantly for review of 3 cm in diameter circular wound to the left anterior lateral leg. She was in the ER on 08/01/16 I reviewed their notes. There was apparently pus coming out of the wound at that time and the patient arrived requesting debridement which they don't do in the emergency room. Nevertheless I can't see that they did any  x-rays. There were no cultures done. She is a type II diabetic and I a note after the patient was in the clinic that she had a bypass graft from the popliteal to the tibial on the right on 02/28/16. She also had a right greater saphenous vein harvest on the same date for arterial bypass. She is going to have vascular studies including ABIs T ABIs on the right on 08/28/16. The patient's surgery was on 02/28/16 by Dr. Vallarie Mare she had a right below the knee popliteal artery to peroneal artery bypass with reverse greater saphenous vein and an endarterectomy of the mid segment peroneal artery. Postoperatively she had a strong mild monophasic peroneal signal with a pink foot. It would appear that the patient is had some nonhealing in the surgical saphenous vein harvest site on the left  leg. Surprisingly looking through [facility] I cannot see much information about this at all. Dr. Lucious Groves notes from 05/29/16 show that the patient's wounds "are not healed" the right first metatarsal wound healed but then opened back up. 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 At that point the patient had a wound VAC to her right calf with regards to the wounds on her dorsal right toe would appear that these are felt to be arterial wounds. She has had surgery on the metatarsal phalangeal in 2015 I Dr. Doran Durand secondary to a right metatarsal phalangeal joint fracture. She is apparently had discoloration around this area since then. 08/28/16; patient arrives with her wounds in much the same condition. The linear vein harvest site and the circular wound below it which I think was a blister. She also has to probing holes in her right great toe and a necrotic eschar on the right second toe. Because of these being arterial wounds I reduced her compression from 3-2 layers this seems to of done satisfactorily she has not had any problems. I cannot see that she is actually had an x-ray ====== 11/06/16 the patient comes in for evaluation of her right lower extremity ulcers. She was here in January 2018 for 2 visits subsequently ended up in the hospital with pneumonia and then to rehabilitation. She has now been discharged from rehabilitation and is home. She has multiple ulcerations to her right lower extremity including the foot and toes. She does have home health in place and they have been placing alginate to the ulcers. She is followed by Dr. Bridgett Larsson of vascular medicine. 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 She recently saw him on 3/23. In office ABIs were: Right 0.48 with monophasic flow to the DP, PT, peroneal Left 0.63  with monophasic flow to the DP and PT Her arterial studies indicated a patent right below knee popliteal to peroneal bypass She had an MRI in February 2008 that was negative frosty myelitis but this showed general soft tissue edema in the right foot and lower extremity concerning for cellulitis She is a diabetic, managed with insulin. Her hemoglobin A1c in December 2017 was 8.4 which is a trend up from previous levels. She had blood work in February 2018 which revealed an albumin of 2.6 this appears to be relatively acute as an albumin in November 2017 was 3.7 11/13/16; this is a patient I have not seen since February who is readmitted to our clinic last week. She is a  type II diabetic on Ramcharan, Maribell V. ([id number]) insulin with known severe PAD status post revascularization in the left leg by Dr. Bridgett Larsson. I have reviewed Dr. Lianne Moris notes from March/23/18. Doppler ABI on that date showed an ABI on the right of 0.48 and on the left of 0.63. Dorsalis pedis waveforms were monophasic bilaterally. There was no waveforms detected at the posterior tibial on the right, monophasic on the left. Dr. Lianne Moris comments were that this patient would have follow-up vascular studies in 3 months including ABIs and right lower extremity arterial duplex. She had an MRI in February that was negative for osteomyelitis but showed generalized edema in the foot. Last albumin I see was in January at 3.4 we have been using Santyl to the 4 wounds on the right leg the patient is noted today to have widespread edema well up towards her groin this is pitting 2-3+. 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 Normal ejection fraction. 11/20/16; patient has a follow-up appointment with Dr. Bridgett Larsson on April 23. She is still complaining of a lot of pain in the right foot and right leg. It is not clear to me that this is at all positional however I think it is clear claudication with  minimal activity perhaps at rest. At our suggestion she is return to her primary physician's office tomorrow with regards to her pitting lower extremity bilateral edema that I reviewed in detail last week 11/27/16; the follow-up with Dr. Bridgett Larsson was actually on May 23 on April 23 as I stated in my note last week. N/A case all of her wounds seems somewhat smaller. The 2 on the right leg are definitely smaller. The areas on the dorsal right first toe, right third toe and the lateral part of the right fifth metatarsal head all looks smaller but have tightly adherent surfaces. We have been using Santyl 12/04/16; follow-up with Dr. Bridgett Larsson on May 23. 2 small open wounds on the right leg continue to get smaller. The area on the right third total lateral aspect of the right fifth toe also look better. The remaining area on the dorsal first toe still has some depth to it. We have been using Santyl to the toes and collagen on the right 12/11/16; according to patient's husband the follow-up with Dr. Bridgett Larsson is not until July. All of the wounds on the right leg are measuring smaller. We have been using some combination of Prisma and Santyl although I think we can go to straight Prisma today. There may have been some confusion with home health about the primary dressing orders here. 12/25/16; the patient has had some healing this week. The area on her right lateral fifth metatarsal head, right third toe are both healed lower right leg is healed. In the vein harvest site superiorly she has one superficial open area. On the dorsal aspect of her right great toe/MTP joint the wound is now divided into 2 however the proximal area is deep and there is palpable bone 01/01/17; still an open area in the middle of her original right surgical scar. The area on the right third toe and right lateral fifth metatarsal head remained closed. Problematic area on the dorsal aspect of the toe. Previous surgery in this area line 01/08/17 small open  area on the original scar on her upper anterior leg although this is closing. X-ray I did of the right first toe did not show underlying bony abnormality. Still this area on  the dorsal first toe probes to bone. We have been using Prisma 01/15/17; small open area in the original scar in the upper anterior leg is almost fully closed. She has 2 open areas over the dorsal aspect of the right first toe that probes to bone. Used and a form starting last week. Vigorous bone scraping that I did last week showed few methicillin sensitive staph aureus. She is allergic to penicillin and sulfa drugs. I'm going to give her 2 weeks of doxycycline. She will need an MRI with contrast. 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 She continues on doxycycline for a bone scraping that showed methicillin sensitive staph aureus [allergic to penicillin and sulfa]. We have been using Endo form to the wound. 01/29/17; surprisingly her MRI did not show osteomyelitis. She continues on doxycycline for a bone scraping that showed methicillin sensitive staph aureus with allergies to penicillin and sulfa. We have been using Endo form to the wound. Unfortunately I cannot get a surface on this visit looks like it is able to support a healing state. Proximally there is still exposed bone. There is no overt soft tissue tenderness. Her MRI did show a previous fracture was surgery to this area but no hardware 02/12/17 on evaluation today patient appears to be doing well in regard to her lower extremity wound. She does have some mild discomfort but this is minimal. There's no evidence of infection. Her left great toe nail has somewhat been lifted up and there is a little bit of slight bleeding underneath but this is still firmly attached. 02/19/17; she ran out of doxycycline 2 days ago. Nevertheless I would like to continue this for 2 weeks to make a full 6  weeks of therapy as the bone scraping that I did from the open area on the dorsal right first toe showed MRSA. This should complete antibiotic therapy. 02/26/17; the patient is completing her 6 weeks of oral doxycycline. Deep wound on the dorsal right toe. This still has some exposed bone proximally. She does have a reasonably granulated surface albeit thin over bone. I've been using Endo form have made application for an amniotic skin sub 03/05/17; the patient has completed 6 weeks of doxycycline. This is a deep wound on the dorsal right toe which has exposed bone proximally. She has granulation over most of this wound albeit a thin layer. I've been using Endo form. Still do not have approval for [employer] 03/13/17 on evaluation today patient's right foot wound appears to be doing better measurement wise compared to her last evaluation. She has been tolerating the dressing change without complication and does have a appointment with the dietary that has been made as far as referral is concerned there just waiting for her and transportation to contact them back for an actual date. Nonetheless patient has been having less pain at this point. No fevers, chills, nausea, or vomiting noted at this time. CINCERE, DEPREY ([id number]) 03/26/17; linear wound over the dorsal aspect of her right great toe. At one point this had exposed bone on the most superior aspect however I am pleased to see today that this appears to have a surface of granulation. Apparently product was applied for but denied by [facility]. We'll need to see what that was. The patient has known PAD status post revascularization. MRI did not show osteomyelitis which was done in June 04/02/17; continued improvement using Endoform.  She was approved for [employer] but hasn't over $588 co-pay per application, this is beyond her means 04/09/17; continues on Endoform. 04/16/17; appears to be doing nicely continuing on Endoform 04/22/17; patient did not have her  dressing changed all week because of the weather. Using Endoform. Base of the wound looks healthy elbow there appears to events surrounding maceration perhaps because of the drainage was not changing the wound dressing 04/30/17 wound today continues to close and except for a small divot on the superior part of the wound. This area did not probe the bone but I found this a little concerning as this was the area with exposed bone before we be able to get this to granulate forward. As I remember things she is not a candidate for skin substitutes secondary to an outlandish co-pay. I asked her husband to look into the out of pocket max for medications if he has 1 [Regranex] or totally for skin substitutes 05/07/17; the patient arrives today with a wound roughly the same size although under careful inspection under the light there appears to be some epithelialization. Her intake nurse noted that the Endoform seemed to be placed over the wound rather than in the deeper divots they could really benefit from the Endoform. At this point I have no plans to change the Endoform as at one point this was at least 33% exposed bone and the rest of the wound very close to the underlying phalanx 05/14/17; no major change in the size or appearance of the wound. We have been using Endoform for a prolonged period of time with reasonably good improvement in the epithelialization i.e. no exposed bone especially proximally. Unless we've not made a lot of changes in the last several weeks. She does not complain of pain. She was revascularized early this year or perhaps late last year by Dr. Bridgett Larsson and I wonder if he will need to have a look at her, I will need to review the actual vascular procedure which I think was a distal popliteal bypass 05/21/17; switched to either for a blue last week. Patient arrived in clinic with the wound not measuring any better but the surface looking some better. Unfortunately she had some surface  slough and when I went to remove this superiorly she had exposed bone. Previously she had had exposed bone in the inferior part of the wound however this is granulated over some weeks ago. Clearly this is a major step back for her. With some difficulty I was able to obtain a piece of the bone probing through the superior part of the wound for culture. We did not send pathology. It is likely that she is going to need imaging of this site but I did not order this today 05/28/17; bone culture I took last week showed MSSA. She still has exposed bone however I could not get another piece for pathology. She had an MRI 4 months ago that did not show osteoarthritic at time. Wound rapidly deteriorated superiorly and I suspect this is underlying osteomyelitis. We have been using Hydrofera Blue 06/04/17 on evaluation today patient's wound appears to actually be doing a little bit better visually compared to last week's evaluation which is good news. Nonetheless she does have osteomyelitis of the toe which does not appear to be MRSA. No fevers, chills, nausea, or vomiting noted at this time. She is having no discomfort. 06/11/17; patient's wound looks a little healthier than I remember seeing it 2 weeks ago. There is no exposed bone  and the surface of the wound appears well granulated. We've been using hydrofera blue. I note that she was started on doxycycline which was MSSA. Her appointment with Dr. Ola Spurr is on November 12 I believe. 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 Vallarie Mare of vascular surgery on 11/9. He offered right leg angiography possible intervention however the patient refused. I've discussed this with the patient's husband today she did not want any further procedures. Also noteworthy that Dr. Vallarie Mare stated that this wound had not healed in 3 years, I was not aware of this degree of chronicity in this area. She has underlying osteomyelitis by bone culture. Culture  of this grew MSSA, she is allergic to penicillin and therefore not a candidate for beta lactams. She saw Dr. Ola Spurr of infectious disease this morning, he continued her on doxycycline for another month and apparently sent in a prescription. We have been using Hydrofera Blue. ABI's update by Dr. Bridgett Larsson right 0.62, left 0.89. Monophasic wave forms 06/25/17 on evaluation today patient appears to be doing well in regard to her right great toe wound. She is still taking the doxycycline as prescribed by Dr. Ola Spurr. The Hydrofera Blue Dressing's to be doing as well as anything has for her up to this point and we are still waiting on an insurance authorization for the [place]. She is having no pain which is good news. No fevers, chills, nausea, or vomiting noted at this time. Patient History Information obtained from Patient. Social History Batzel, SARYE KATH ([id number]) Never smoker, Marital Status - Married, Alcohol Use - Never, Drug Use - No History, Caffeine Use - Daily. Medical History Hospitalization/Surgery History - 09/16/2016, [facility], Double Pneumonia. Medical And Surgical History Notes Cardiovascular Stroke 2017 Review of Systems (ROS) Constitutional Symptoms (General Health) Denies complaints or symptoms of Fever, Chills. Respiratory The patient has no complaints or symptoms. Cardiovascular Complains or has symptoms of LE edema. Psychiatric The patient has no complaints or symptoms. Objective Constitutional Obese and well-hydrated in no acute distress. Vitals Time Taken: 10:15 AM, Height: 64 in, Weight: 200 lbs, BMI: 34.3, Temperature: 97.8 F, Pulse: 78 bpm, Respiratory Rate: 16 breaths/min, Blood Pressure: 142/47 mmHg. Respiratory normal breathing without difficulty. Psychiatric this patient is able to make decisions and demonstrates good insight into disease process. Alert and Oriented x 3. pleasant and cooperative. General Notes: Patient does have pills  granulation tissue noted in the wound bed there was some [facility] covering which required debridement and was performed today without complication. She had no discomfort. Integumentary (Hair, Skin) Wound #7 status is Open. Original cause of wound was Gradually Appeared. The wound is located on the Right,Dorsal Metatarsal head first. The wound measures 1.9cm length x 0.6cm width x 0.2cm depth; 0.895cm^2 area and 0.179cm^3 volume. There is Fat Layer (Subcutaneous Tissue) Exposed exposed. There is no tunneling or undermining noted. There is a large amount of serous drainage noted. The wound margin is flat and intact. There is medium (34-66%) red granulation within the wound bed. There is a medium (34-66%) amount of necrotic tissue within the wound bed including Adherent Slough. The periwound skin appearance exhibited: Induration, Hemosiderin Staining. The periwound skin appearance did not exhibit: Callus, Crepitus, Excoriation, Rash, Scarring, Dry/Scaly, Maceration, Atrophie Blanche, Cyanosis, Ecchymosis, Mottled, Pallor, Rubor, Erythema. The periwound has tenderness on palpation. 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 There was a Skin/Subcutaneous Tissue Debridement ([id number]) debridement with total area of 1.14 sq cm performed by STONE III, Miski Feldpausch E., PA-C. with the following instrument(s): Curette to remove Viable and Non-Viable tissue/material including Fibrin/Slough, Eschar, and Subcutaneous after achieving pain control using Other (lidocaine 4%). A time out was conducted at 10:35, prior to the start of the procedure. A Moderate amount of bleeding was controlled with Pressure. The procedure was tolerated well with a pain level of 0 throughout and a pain level of 0 following the procedure. Post Debridement Measurements: 1.9cm length x 0.6cm width x 0.2cm depth; 0.179cm^3 volume. Character of Wound/Ulcer Post Debridement is stable. Severity of Tissue Post Debridement is: Fat layer exposed. Post procedure Diagnosis Wound #7: Same as Pre-Procedure Plan Wound Cleansing: Wound #7 Right,Dorsal Metatarsal head first: Clean wound with Normal Saline. Cleanse wound with mild soap and water Anesthetic: Wound #7 Right,Dorsal Metatarsal head first: Topical Lidocaine 4% cream applied to wound bed prior to debridement Primary Wound Dressing: Wound #7 Right,Dorsal Metatarsal head first: Hydrafera Blue Secondary Dressing: Wound #7 Right,Dorsal Metatarsal head first: ABD pad Conform/Kerlix Dressing Change Frequency: Wound #7 Right,Dorsal Metatarsal head first: MINAAL, STRUCKMAN ([id number]) Change Dressing Monday, Wednesday, Friday Follow-up Appointments: Wound #7 Right,Dorsal Metatarsal head first: Return Appointment in 1 week. Edema Control: Wound #7 Right,Dorsal Metatarsal head first: Elevate legs to the level of the heart and pump ankles as often as possible Additional Orders / Instructions: Wound #7 Right,Dorsal Metatarsal head first: Increase protein intake. Activity as tolerated Home Health: Wound #7 Right,Dorsal Metatarsal head first: Long Visits - [facility] twice weekly, [facility] Home Health Nurse may visit PRN to address patient s wound care needs. FACE TO FACE ENCOUNTER: MEDICARE and MEDICAID PATIENTS: I certify that this patient is under my care and that I had a face-to-face encounter that meets the physician face-to-face encounter requirements with this patient on this date. The encounter with the patient was in whole or in part for the following MEDICAL CONDITION: (primary reason for [place]) MEDICAL NECESSITY: I certify, that based on my findings, NURSING services are a medically necessary home health service. HOME BOUND STATUS: I certify that my clinical findings support that this patient is homebound (i.e., Due to illness or injury, pt requires aid of supportive devices such as crutches, cane, wheelchairs, walkers, the use of special transportation or the assistance of another person to leave their place of residence. There is a normal inability to leave the home and doing so requires considerable and taxing effort. Other absences are for medical reasons / religious services and are infrequent or of short duration when for other reasons). If current dressing causes regression in wound condition, may D/C ordered dressing product/s and apply Normal Saline Moist Dressing daily until next [place] / Other MD appointment. Tat[place] of regression in wound condition at [phone]. Please direct any  NON-WOUND related issues/requests for orders to patient's Primary Care Physician Medications-please add to medication list.: Wound #7 Right,Dorsal Metatarsal head first: P.O. Antibiotics - continue antibiotics as prescribed I am going to recommend that we continue with the Current wound care measures for the next week. We will continue to work on getting the approval for the advanced tissue products. Please see above for specific wound care orders. We will see patient  for re-evaluation in 1 week here in the clinic. If anything worsens or changes patient will contact our office for additional recommendations. Electronic Signature(s) Signed: 06/25/2017 4:01:13 PM By: Worthy Keeler PA-C Entered By: Worthy Keeler on 06/25/2017 13:29:01 Brickey, Karinna [name] ([id number]) -------------------------------------------------------------------------------- ROS/PFSH Details Patient Name: Falwell, Sissi V. Date of Service: 06/25/2017 10:15 AM Medical Record Number: [id number] Patient Account Number: [id number] Date of Birth/Sex: 1940-09-30 (76 y.o. Female) Treating RN: Roger Shelter Primary Care Provider: Beverlyn Roux Other Clinician: Referring Provider: Beverlyn Roux Treating Provider/Extender: Melburn Hake, Marinna Blane Weeks in Treatment: 12 Information Obtained From Patient Wound History Do you currently have one or more open woundso Yes How many open wounds do you currently haveo 4 Approximately how long have you had your woundso 2 years How have you been treating your wound(s) until nowo aquacel ag Has your wound(s) ever healed and then re-openedo No Have you had any lab work done in the past montho No Have you tested positive for an antibiotic resistant organism (MRSA, VRE)o No Have you had any tests for circulation on your legso Yes Where was the test doneo [facility] Constitutional Symptoms (General Health) Complaints and Symptoms: Negative for: Fever; Chills Cardiovascular Complaints and Symptoms: Positive for: LE edema Medical History: Positive for: Congestive Heart Failure; Hypertension Negative for: Angina; Arrhythmia; Coronary Artery Disease; Deep Vein Thrombosis; Hypotension; Myocardial Infarction; Peripheral Arterial Disease; Peripheral Venous Disease; Phlebitis Past Medical History Notes: Stroke 2017 Eyes Medical History: Positive for: Cataracts - removed Negative for: Glaucoma; Optic Neuritis Ear/Nose/Mouth/Throat Medical History: Positive for:  Chronic sinus problems/congestion Negative for: Middle ear problems Hematologic/Lymphatic Medical History: Negative for: Anemia; Hemophilia; Human Immunodeficiency Virus; Lymphedema; Sickle Cell Disease Respiratory Gaeta, Denajah V. ([id number]) Complaints and Symptoms: No Complaints or Symptoms Medical History: Negative for: Aspiration; Asthma; Chronic Obstructive Pulmonary Disease (COPD); Pneumothorax; Sleep Apnea; Tuberculosis Gastrointestinal Medical History: Negative for: Cirrhosis ; Colitis; Crohnos; Hepatitis A; Hepatitis B; Hepatitis C Endocrine Medical History: Positive for: Type II Diabetes Negative for: Type I Diabetes Time with diabetes: 13 years Treated with: Insulin Blood sugar tested every day: Yes Tested : 2 Blood sugar testing results: Breakfast: 202 Genitourinary Medical History: Negative for: End Stage Renal Disease Immunological Medical History: Negative for: Lupus Erythematosus; Raynaudos; Scleroderma Integumentary (Skin) Medical History: Negative for: History of Burn; History of pressure wounds Musculoskeletal Medical History: Negative for: Gout; Rheumatoid Arthritis; Osteoarthritis; Osteomyelitis Neurologic Medical History: Negative for: Dementia; Neuropathy; Quadriplegia; Paraplegia; Seizure Disorder Oncologic Medical History: Negative for: Received Chemotherapy; Received Radiation Psychiatric Complaints and Symptoms: No Complaints or Symptoms Medical History: Stettler, Lacole V. ([id number]) Negative for: Anorexia/bulimia; Confinement Anxiety HBO Extended History Items Eyes: Ear/Nose/Mouth/Throat: Cataracts Chronic sinus problems/congestion Immunizations Pneumococcal Vaccine: Received Pneumococcal Vaccination: Yes Implantable Devices Hospitalization / Surgery History Name of Hospital Purpose of Hospitalization/Surgery Date [facility] Pneumonia 09/16/2016 Family and Social History Never smoker; Marital Status - Married; Alcohol Use:  Never; Drug Use: No History; Caffeine Use: Daily; Advanced Directives: No; Patient does not want information on Advanced Directives; Living Will: No; Medical Power of Attorney: No Physician Affirmation I have reviewed and  agree with the above information. Electronic Signature(s) Signed: 06/25/2017 3:20:43 PM By: Roger Shelter Signed: 06/25/2017 4:01:13 PM By: Worthy Keeler PA-C Entered By: Worthy Keeler on 06/25/2017 10:43:00 Branham, Aala V. ([id number]) -------------------------------------------------------------------------------- SuperBill Details Patient Name: Martorana, Nohely V. Date of Service: 06/25/2017 Medical Record Number: [id number] Patient Account Number: [id number] Date of Birth/Sex: Aug 31, 1940 (76 y.o. 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, right ankle and foot Facility Procedures CPT4 Code Description: 56256389 11042 - DEB SUBQ TISSUE 20 SQ CM/< ICD-10 Diagnosis Description 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

## 2017-07-02 ENCOUNTER — Encounter: Payer: Medicare HMO | Admitting: Internal Medicine

## 2017-07-02 DIAGNOSIS — E11622 Type 2 diabetes mellitus with other skin ulcer: Secondary | ICD-10-CM | POA: Diagnosis not present

## 2017-07-03 NOTE — Progress Notes (Signed)
[name], [name] ([id number]) Visit Report for 07/02/2017 Arrival Information Details Patient Name: [name] Date of Service: 07/02/2017 10:15 AM Medical Record Number: [id number] Patient Account Number: [id number] Date of Birth/Sex: 12/04/1940 (76 y.o. Female) Treating RN: [name] Primary Care [name]: [name] Other Clinician: Referring [name]: [name] Treating [name]/Extender: [name] in Treatment: 34 Visit Information History Since Last Visit Pain Present Now: Yes Patient Arrived: Ambulatory Arrival Time: 09:52 Accompanied By: [name] Transfer Assistance: EasyPivot Patient Lift Patient Requires Transmission-Based No Precautions: Patient Has Alerts: No Electronic Signature(s) Signed: 07/02/2017 4:54:07 PM By: [name] Entered By: [name] on 07/02/2017 09:58:02 [name], [name] ([id number]) -------------------------------------------------------------------------------- Encounter Discharge Information Details Patient Name: [name], [name] Date of Service: 07/02/2017 10:15 AM Medical Record Number: [id number] Patient Account Number: [id number] Date of Birth/Sex: 1941-06-05 (76 y.o. Female) Treating RN: [name] Primary Care Yunuen Mordan: [name] Other Clinician: Referring [name]: [name] Treating [name]/Extender: [name] in Treatment: 33 Encounter Discharge Information Items Discharge Pain Level: 0 Discharge Condition: Stable Ambulatory Status: Wheelchair Discharge Destination: Home Transportation: Other Accompanied By: [name] Schedule Follow-up Appointment: Yes Medication Reconciliation completed and provided No to Patient/Care Bevan Disney: Clinical Summary of Care: Electronic Signature(s) Signed: 07/02/2017 4:54:07 PM By: [name] Entered By: [name] on 07/02/2017 10:44:30 [name]  ([id number]) -------------------------------------------------------------------------------- Lower Extremity Assessment Details Patient Name: [name], [name] Date of Service: 07/02/2017 10:15 AM Medical Record Number: [id number] Patient Account Number: [id number] Date of Birth/Sex: Sep 08, 1940 (76 y.o. Female) Treating RN: [name] Primary Care [name]: [name] Other Clinician: Referring [name]: [name] Treating [name]/Extender: [name] in Treatment: 34 Edema Assessment Assessed: [Left: No] [Right: Yes] Edema: [Left: Ye] [Right: s] Vascular Assessment Claudication: Claudication Assessment [Right:None] Pulses: Dorsalis Pedis Palpable: [Right:No] Posterior Tibial Extremity colors, hair growth, and conditions: Extremity Color: [Right:Normal] Hair Growth on Extremity: [Right:No] Temperature of Extremity: [Right:Cool] Capillary Refill: [Right:> 3 seconds] Toe Nail Assessment Left: Right: Thick: No Discolored: No Deformed: No Improper Length and Hygiene: No Electronic Signature(s) Signed: 07/02/2017 4:54:07 PM By: [name] Entered By: [name] on 07/02/2017 10:12:43 [name], [name] ([id number]) -------------------------------------------------------------------------------- Multi Wound Chart Details Patient Name: [name], [name] Date of Service: 07/02/2017 10:15 AM Medical Record Number: [id number] Patient Account Number: [id number] Date of Birth/Sex: [birth year] (76 y.o. Female) Treating RN: [name] Primary Care [name]: [name] Other Clinician: Referring [name]: [name] Treating [name]/Extender: [name] in Treatment: 34 Vital Signs Height(in): 64 Pulse(bpm): 71 Weight(lbs): 200 Blood Pressure(mmHg): 129/77 Body Mass Index(BMI): 34 Temperature(F): 98.5 Respiratory Rate 16 (breaths/min): Photos: [N/A:N/A] Wound Location: Right Metatarsal head first - N/A N/A Dorsal Wounding  Event: Gradually Appeared N/A N/A Primary Etiology: Diabetic Wound/Ulcer of the N/A N/A Lower Extremity Comorbid History: Cataracts, Chronic sinus N/A N/A problems/congestion, Congestive Heart Failure, Hypertension, Type II Diabetes Date Acquired: 08/06/2013 N/A N/A Weeks of Treatment: 34 N/A N/A Wound Status: Open N/A N/A Measurements L x W x D 1.9x0.9x0.2 N/A N/A (cm) Area (cm) : 1.343 N/A N/A Volume (cm) : 0.269 N/A N/A % Reduction in Area: -14.00% N/A N/A % Reduction in Volume: 23.80% N/A N/A Classification: Grade 2 N/A N/A Exudate Amount: Large N/A N/A Exudate Type: Serous N/A N/A Exudate Color: amber N/A N/A Wound Margin: Flat and Intact N/A N/A Granulation Amount: Medium (34-66%) N/A N/A Granulation Quality: Red N/A N/A Necrotic Amount: Medium (34-66%) N/A N/A Exposed Structures: Fat Layer (Subcutaneous N/A N/A Tissue) Exposed: Yes Fascia: No Tendon: No Muscle: No  [name] ([id number]) Joint: No Bone: No Epithelialization: Small (1-33%) N/A N/A Debridement: Debridement ([id number]) N/A N/A Pre-procedure 10:24 N/A N/A Verification/Time Out Taken: Pain Control: Other N/A N/A Tissue Debrided: Necrotic/Eschar, N/A N/A Fibrin/Slough, Subcutaneous Level: Skin/Subcutaneous Tissue N/A N/A Debridement Area (sq cm): 1.71 N/A N/A Instrument: Curette N/A N/A Bleeding: Minimum N/A N/A Hemostasis Achieved: Pressure N/A N/A Procedural Pain: 0 N/A N/A Post Procedural Pain: 0 N/A N/A Debridement Treatment Procedure was tolerated well N/A N/A Response: Post Debridement 1.9x0.9x0.2 N/A N/A Measurements L x W x D (cm) Post Debridement Volume: 0.269 N/A N/A (cm) Periwound Skin Texture: Induration: Yes N/A N/A Excoriation: No Callus: No Crepitus: No Rash: No Scarring: No Periwound Skin Moisture: Maceration: No N/A N/A Dry/Scaly: No Periwound Skin Color: Hemosiderin Staining: Yes N/A N/A Atrophie Blanche: No Cyanosis: No Ecchymosis: No Erythema:  No Mottled: No Pallor: No Rubor: No Tenderness on Palpation: Yes N/A N/A Wound Preparation: Ulcer Cleansing: N/A N/A Rinsed/Irrigated with Saline Topical Anesthetic Applied: Other: lidocaine 4% Procedures Performed: Debridement N/A N/A Treatment Notes Electronic Signature(s) Signed: 07/02/2017 5:42:06 PM By: [name] Ham MD Entered By: [name] on 07/02/2017 10:29:17 [name], [name] ([id number]) -------------------------------------------------------------------------------- Multi-Disciplinary Care Plan Details Patient Name: Pizana, Zylee V. Date of Service: 07/02/2017 10:15 AM Medical Record Number: [id number] Patient Account Number: [id number] Date of Birth/Sex: 1941-05-31 (76 y.o. Female) Treating RN: [name] Primary Care Merik Mignano: [name] Other Clinician: Referring Reba Hulett: [name] Treating Florance Paolillo/Extender: [name] in Treatment: 42 Active Inactive ` Abuse / Safety / Falls / Self Care Management Nursing Diagnoses: Impaired physical mobility Potential for falls Goals: Patient will remain injury free Date Initiated: 11/06/2016 Target Resolution Date: 12/30/2016 Goal Status: Active Patient/caregiver will verbalize understanding of skin care regimen Date Initiated: 11/06/2016 Target Resolution Date: 12/30/2016 Goal Status: Active Interventions: Assess fall risk on admission and as needed Treatment Activities: Patient referred to home care : 11/06/2016 Notes: ` Nutrition Nursing Diagnoses: Potential for alteratiion in Nutrition/Potential for imbalanced nutrition Goals: Patient/caregiver verbalizes understanding of need to maintain therapeutic glucose control per primary care physician Date Initiated: 11/06/2016 Target Resolution Date: 12/30/2016 Goal Status: Active Interventions: Provide education on elevated blood sugars and impact on wound healing Notes: ` Orientation to the Wound Care Program Nursing Diagnoses: Knowledge  deficit related to the wound healing center program Tardif, [name] ([id number]) Goals: Patient/caregiver will verbalize understanding of the [place] Date Initiated: 11/06/2016 Target Resolution Date: 12/30/2016 Goal Status: Active Interventions: Provide education on orientation to the wound center Notes: ` Venous Leg Ulcer Nursing Diagnoses: Actual venous Insuffiency (use after diagnosis is confirmed) Knowledge deficit related to disease process and management Goals: Non-invasive venous studies are completed as ordered Date Initiated: 11/06/2016 Target Resolution Date: 12/30/2016 Goal Status: Active Patient/caregiver will verbalize understanding of disease process and disease management Date Initiated: 11/06/2016 Target Resolution Date: 12/30/2016 Goal Status: Active Interventions: Assess peripheral edema status every visit. Notes: ` Wound/Skin Impairment Nursing Diagnoses: Impaired tissue integrity Knowledge deficit related to smoking impact on wound healing Knowledge deficit related to ulceration/compromised skin integrity Goals: Ulcer/skin breakdown will heal within 14 weeks Date Initiated: 11/06/2016 Target Resolution Date: 02/05/2017 Goal Status: Active Interventions: Assess ulceration(s) every visit Treatment Activities: Skin care regimen initiated : 11/06/2016 Notes: Electronic Signature(s) Signed: 07/02/2017 4:54:07 PM By: Denzil Hughes ([id number]) Entered By: [name] on 07/02/2017 10:13:50 Branden, Saray V. ([id number]) -------------------------------------------------------------------------------- Pain Assessment Details Patient Name: Netzer, [name] Date of Service: 07/02/2017 10:15 AM Medical Record Number: [id number] Patient  Account Number: [id number] Date of Birth/Sex: 02-Aug-1941 (76 y.o. Female) Treating RN: [name] Primary Care Eran Windish: [name] Other Clinician: Referring Hance Caspers: [name] Treating Latavius Capizzi/Extender: [name] in Treatment: 34 Active Problems Location of Pain Severity and Description of Pain Patient Has Paino Yes Site Locations Pain Location: Pain in Ulcers Duration of the Pain. Constant / Intermittento Intermittent Rate the pain. Current Pain Level: 5 Character of Pain Describe the Pain: Aching, Dull Pain Management and Medication Current Pain Management: Electronic Signature(s) Signed: 07/02/2017 4:54:07 PM By: [name] Entered By: [name] on 07/02/2017 09:58:23 [name], Javonne [name] ([id number]) -------------------------------------------------------------------------------- Patient/Caregiver Education Details Patient Name: [name], [name] Date of Service: 07/02/2017 10:15 AM Medical Record Number: [id number] Patient Account Number: [id number] Date of Birth/Gender: [birth year] (76 y.o. Female) Treating RN: [name] Primary Care Physician: [name] Other Clinician: Referring Physician: Beverlyn [name] Treating Physician/Extender: [name] in Treatment: 102 Education Assessment Education Provided To: Patient and Caregiver fax orders to home health Education Topics Provided Wound Debridement: Handouts: Wound Debridement Methods: Explain/Verbal Responses: State content correctly Wound/Skin Impairment: Handouts: Caring for Your Ulcer Methods: Explain/Verbal Responses: State content correctly Electronic Signature(s) Signed: 07/02/2017 4:54:07 PM By: [name] Entered By: [name] on 07/02/2017 10:44:58 [name], [name] ([id number]) -------------------------------------------------------------------------------- Wound Assessment Details Patient Name: [name], [name] Date of Service: 07/02/2017 10:15 AM Medical Record Number: [id number] Patient Account Number: [id number] Date of Birth/Sex: 1940/08/07 (76 y.o. Female) Treating RN: [name] Primary Care Lekendrick Alpern: [name] Other Clinician: Referring Camelle Henkels: [name] Treating Desarea Ohagan/Extender: [name] in Treatment: 34 Wound Status Wound Number: 7 Primary Diabetic Wound/Ulcer of the Lower Extremity Etiology: Wound Location: Right Metatarsal head first - Dorsal Wound Open Wounding Event: Gradually Appeared Status: Date Acquired: 08/06/2013 Comorbid Cataracts, Chronic sinus problems/congestion, Weeks Of Treatment: 34 History: Congestive Heart Failure, Hypertension, Type II Clustered Wound: No Diabetes Photos Wound Measurements Length: (cm) 1.9 Width: (cm) 0.9 Depth: (cm) 0.2 Area: (cm) 1.343 Volume: (cm) 0.269 % Reduction in Area: -14% % Reduction in Volume: 23.8% Epithelialization: Small (1-33%) Wound Description Classification: Grade 2 Wound Margin: Flat and Intact Exudate Amount: Large Exudate Type: Serous Exudate Color: amber Foul Odor After Cleansing: No Slough/Fibrino Yes Wound Bed Granulation Amount: Medium (34-66%) Exposed Structure Granulation Quality: Red Fascia Exposed: No Necrotic Amount: Medium (34-66%) Fat Layer (Subcutaneous Tissue) Exposed: Yes Necrotic Quality: Adherent Slough Tendon Exposed: No Muscle Exposed: No Joint Exposed: No Bone Exposed: No Periwound Skin Texture Texture Color No Abnormalities Noted: No No Abnormalities Noted: No Callus: No Atrophie Blanche: No Feider, [name] ([id number]) Crepitus: No Cyanosis: No Excoriation: No Ecchymosis: No Induration: Yes Erythema: No Rash: No Hemosiderin Staining: Yes Scarring: No Mottled: No Pallor: No Moisture Rubor: No No Abnormalities Noted: No Dry / Scaly: No Temperature / Pain Maceration: No Tenderness on Palpation: Yes Wound Preparation Ulcer Cleansing: Rinsed/Irrigated with Saline Topical Anesthetic Applied: Other: lidocaine 4%, Treatment Notes Wound #7 (Right, Dorsal Metatarsal head first) 1. Cleansed with: Clean wound with Normal Saline 2.  Anesthetic Topical Lidocaine 4% cream to wound bed prior to debridement 4. Dressing Applied: Hydrafera Blue 5. Secondary Dressing Applied ABD Pad Notes heel foam and gauze wrap Electronic Signature(s) Signed: 07/02/2017 4:54:07 PM By: [name] Entered By: [name] on 07/02/2017 10:11:04 Polhemus, [place] V. ([id number]) -------------------------------------------------------------------------------- Vitals Details Patient Name: Rettig, Britney V. Date of Service: 07/02/2017 10:15 AM Medical Record Number: [id number] Patient Account Number: [id number] Date of Birth/Sex: 1940/09/23 (76 y.o. Female) Treating RN: Flinchum,  Cheryl Primary Care Tegh Franek: [name] Other Clinician: Referring [place] Mielke: [name] Treating Deakon Frix/Extender: [name] in Treatment: 34 Vital Signs Time Taken: 10:00 Temperature (F): 98.5 Height (in): 64 Pulse (bpm): 78 Weight (lbs): 200 Respiratory Rate (breaths/min): 16 Body Mass Index (BMI): 34.3 Blood Pressure (mmHg): 129/77 Reference Range: 80 - 120 mg / dl Electronic Signature(s) Signed: 07/02/2017 4:54:07 PM By: [name] Entered By: [name] on 07/02/2017 10:01:55

## 2017-07-03 NOTE — Progress Notes (Signed)
[name] ([id number]) Visit Report for 07/02/2017 Debridement Details Patient Name: [name], [name] Date of Service: 07/02/2017 10:15 AM Medical Record Number: [id number] Patient Account Number: [id number] Date of Birth/Sex: 1941-01-21 (76 y.o. Female) Treating RN: Roger Shelter Primary Care Provider: Beverlyn Roux Other Clinician: Referring Provider: Beverlyn Roux Treating Provider/Extender: Tito Dine in Treatment: 34 Debridement Performed for Wound #7 Right,Dorsal Metatarsal head first Assessment: Performed By: Physician Ricard Dillon, MD Debridement: Debridement Severity of Tissue Pre Fat layer exposed Debridement: Pre-procedure Verification/Time Yes - 10:24 Out Taken: Start Time: 10:24 Pain Control: Other : lidocaine 4% Level: Skin/Subcutaneous Tissue Total Area Debrided (L x W): 1.9 (cm) x 0.9 (cm) = 1.71 (cm) Tissue and other material Viable, Non-Viable, Eschar, Fibrin/Slough, Subcutaneous debrided: Instrument: Curette Bleeding: Minimum Hemostasis Achieved: Pressure End Time: 10:25 Procedural Pain: 0 Post Procedural Pain: 0 Response to Treatment: Procedure was tolerated well Post Debridement Measurements of Total Wound Length: (cm) 1.9 Width: (cm) 0.9 Depth: (cm) 0.2 Volume: (cm) 0.269 Character of Wound/Ulcer Post Debridement: Stable Severity of Tissue Post Debridement: Fat layer exposed Post Procedure Diagnosis Same as Pre-procedure Electronic Signature(s) Signed: 07/02/2017 4:54:07 PM By: Roger Shelter Signed: 07/02/2017 5:42:06 PM By: Linton Ham MD Entered By: Linton Ham on 07/02/2017 10:29:28 [name], [name] ([id number]) -------------------------------------------------------------------------------- HPI Details Patient Name: [name], [name] Date of Service: 07/02/2017 10:15 AM Medical Record Number: [id number] Patient Account Number: [id number] Date of Birth/Sex: Jan 19, 1941 (76 y.o. Female) Treating RN: Roger Shelter Primary Care Provider: Beverlyn Roux Other Clinician: Referring Provider: Beverlyn Roux Treating Provider/Extender: Tito Dine in Treatment: 34 History of Present Illness HPI Description: 08/13/16: This is a 76 year old woman who came predominantly for review of 3 cm in diameter circular wound to the left anterior lateral leg. She was in the ER on 08/01/16 I reviewed their notes. There was apparently pus coming out of the wound at that time and the patient arrived requesting debridement which they don't do in the emergency room. Nevertheless I can't see that they did any x-rays. There were no cultures done. She is a type II diabetic and I a note after the patient was in the clinic that she had a bypass graft from the popliteal to the tibial on the right on 02/28/16. She also had a right greater saphenous vein harvest on the same date for arterial bypass. She is going to have vascular studies including ABIs T ABIs on the right on 08/28/16. The patient's surgery was on 02/28/16 by Dr. Vallarie Mare she had a right below the knee popliteal artery to peroneal artery bypass with reverse greater saphenous vein and an endarterectomy of the mid segment peroneal artery. Postoperatively she had a strong mild monophasic peroneal signal with a pink foot. It would appear that the patient is had some nonhealing in the surgical saphenous vein harvest site on the left leg. Surprisingly looking through [facility] I cannot see much information about this at all. Dr. Lucious Groves notes from 05/29/16 show that the patient's wounds "are not healed" the right first metatarsal wound healed but then opened back up. 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 At that point the patient had a wound VAC to her right calf with regards to the wounds on her dorsal right toe would appear that these are felt to be arterial  wounds. She has had surgery on the metatarsal phalangeal in 2015 I Dr. Doran Durand secondary to  a right metatarsal phalangeal joint fracture. She is apparently had discoloration around this area since then. 08/28/16; patient arrives with her wounds in much the same condition. The linear vein harvest site and the circular wound below it which I think was a blister. She also has to probing holes in her right great toe and a necrotic eschar on the right second toe. Because of these being arterial wounds I reduced her compression from 3-2 layers this seems to of done satisfactorily she has not had any problems. I cannot see that she is actually had an x-ray ====== 11/06/16 the patient comes in for evaluation of her right lower extremity ulcers. She was here in January 2018 for 2 visits subsequently ended up in the hospital with pneumonia and then to rehabilitation. She has now been discharged from rehabilitation and is home. She has multiple ulcerations to her right lower extremity including the foot and toes. She does have home health in place and they have been placing alginate to the ulcers. She is followed by Dr. Bridgett Larsson of vascular medicine. 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 She recently saw him on 3/23. In office ABIs were: Right 0.48 with monophasic flow to the DP, PT, peroneal Left 0.63 with monophasic flow to the DP and PT Her arterial studies indicated a patent right below knee popliteal to peroneal bypass She had an MRI in February 2008 that was negative frosty myelitis but this showed general soft tissue edema in the right foot and lower extremity concerning for cellulitis She is a diabetic, managed with insulin. Her hemoglobin A1c in December 2017 was 8.4 which is a trend up from previous levels. She had blood work in February 2018 which revealed an albumin of 2.6 this appears to be relatively acute as an albumin in  November 2017 was 3.7 11/13/16; this is a patient I have not seen since February who is readmitted to our clinic last week. 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 Bridgett Larsson. I have reviewed Dr. Lianne Moris notes from March/23/18. Doppler ABI on that date showed an ABI on the right of 0.48 and on the left of 0.63. Dorsalis pedis waveforms were monophasic bilaterally. There was no waveforms detected at the posterior tibial on the right, monophasic on the left. Dr. Lianne Moris comments were that this patient would have follow-up vascular studies in 3 months including ABIs and right lower extremity arterial duplex. She had an MRI in February that was negative for osteomyelitis but showed generalized edema in the foot. Last albumin I see was in January at 3.4 we have been using Santyl to the 4 wounds on the right leg Moder, Parrie V. ([id number]) the patient is noted today to have widespread edema well up towards her groin this is pitting 2-3+. 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 Normal ejection fraction. 11/20/16; patient has a follow-up appointment with Dr. Bridgett Larsson on April 23. She is still complaining of a lot of pain in the right foot and right leg. It is not clear to me that this is at all positional however I think it is clear claudication with minimal activity perhaps at rest. At our suggestion she is return to her primary physician's office tomorrow with regards to her pitting lower extremity bilateral edema that I reviewed in detail last  week 11/27/16; the follow-up with Dr. Bridgett Larsson was actually on May 23 on April 23 as I stated in my note last week. N/A case all of her wounds seems somewhat smaller. The 2 on the right leg are definitely smaller. The areas on the dorsal right first toe, right third toe and the lateral part of the right fifth metatarsal head all looks smaller but have  tightly adherent surfaces. We have been using Santyl 12/04/16; follow-up with Dr. Bridgett Larsson on May 23. 2 small open wounds on the right leg continue to get smaller. The area on the right third total lateral aspect of the right fifth toe also look better. The remaining area on the dorsal first toe still has some depth to it. We have been using Santyl to the toes and collagen on the right 12/11/16; according to patient's husband the follow-up with Dr. Bridgett Larsson is not until July. All of the wounds on the right leg are measuring smaller. We have been using some combination of Prisma and Santyl although I think we can go to straight Prisma today. There may have been some confusion with home health about the primary dressing orders here. 12/25/16; the patient has had some healing this week. The area on her right lateral fifth metatarsal head, right third toe are both healed lower right leg is healed. In the vein harvest site superiorly she has one superficial open area. On the dorsal aspect of her right great toe/MTP joint the wound is now divided into 2 however the proximal area is deep and there is palpable bone 01/01/17; still an open area in the middle of her original right surgical scar. The area on the right third toe and right lateral fifth metatarsal head remained closed. Problematic area on the dorsal aspect of the toe. Previous surgery in this area line 01/08/17 small open area on the original scar on her upper anterior leg although this is closing. X-ray I did of the right first toe did not show underlying bony abnormality. Still this area on the dorsal first toe probes to bone. We have been using Prisma 01/15/17; small open area in the original scar in the upper anterior leg is almost fully closed. She has 2 open areas over the dorsal aspect of the right first toe that probes to bone. Used and a form starting last week. Vigorous bone scraping that I did last week showed few methicillin sensitive staph aureus.  She is allergic to penicillin and sulfa drugs. I'm going to give her 2 weeks of doxycycline. She will need an MRI with contrast. 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 She continues on doxycycline for a bone scraping that showed methicillin sensitive staph aureus [allergic to penicillin and sulfa]. We have been using Endo form to the wound. 01/29/17; surprisingly her MRI did not show osteomyelitis. She continues on doxycycline for a bone scraping that showed methicillin sensitive staph aureus with allergies to penicillin and sulfa. We have been using Endo form to the wound. Unfortunately I cannot get a surface on this visit looks like it is able to support a healing state. Proximally there is still exposed bone. There is no overt soft tissue tenderness. Her MRI did show a previous fracture was surgery to this area but no hardware 02/12/17 on evaluation today patient appears to be doing well in regard to her lower extremity wound. She does have some mild discomfort  but this is minimal. There's no evidence of infection. Her left great toe nail has somewhat been lifted up and there is a little bit of slight bleeding underneath but this is still firmly attached. 02/19/17; she ran out of doxycycline 2 days ago. Nevertheless I would like to continue this for 2 weeks to make a full 6 weeks of therapy as the bone scraping that I did from the open area on the dorsal right first toe showed MRSA. This should complete antibiotic therapy. 02/26/17; the patient is completing her 6 weeks of oral doxycycline. Deep wound on the dorsal right toe. This still has some exposed bone proximally. She does have a reasonably granulated surface albeit thin over bone. I've been using Endo form have made application for an amniotic skin sub 03/05/17; the patient has completed 6 weeks of doxycycline. This is a deep wound on the dorsal right toe which  has exposed bone proximally. She has granulation over most of this wound albeit a thin layer. I've been using Endo form. Still do not have approval for [employer] 03/13/17 on evaluation today patient's right foot wound appears to be doing better measurement wise compared to her last evaluation. She has been tolerating the dressing change without complication and does have a appointment with the dietary that has been made as far as referral is concerned there just waiting for her and transportation to contact them back for an actual date. Nonetheless patient has been having less pain at this point. No fevers, chills, nausea, or vomiting noted at this time. 03/26/17; linear wound over the dorsal aspect of her right great toe. At one point this had exposed bone on the most superior aspect however I am pleased to see today that this appears to have a surface of granulation. Apparently product was applied for but denied by [facility]. We'll need to see what that was. The patient has known PAD status post revascularization. MRI did not show osteomyelitis which was done in June 04/02/17; continued improvement using Endoform. She was approved for [employer] but hasn't over $161 co-pay per application, this is beyond her means [name], [name] ([id number]) 04/09/17; continues on Endoform. 04/16/17; appears to be doing nicely continuing on Endoform 04/22/17; patient did not have her dressing changed all week because of the weather. Using Endoform. Base of the wound looks healthy elbow there appears to events surrounding maceration perhaps because of the drainage was not changing the wound dressing 04/30/17 wound today continues to close and except for a small divot on the superior part of the wound. This area did not probe the bone but I found this a little concerning as this was the area with exposed bone before we be able to get this to granulate forward. As I remember things she is not a candidate for skin substitutes  secondary to an outlandish co-pay. I asked her husband to look into the out of pocket max for medications if he has 1 [Regranex] or totally for skin substitutes 05/07/17; the patient arrives today with a wound roughly the same size although under careful inspection under the light there appears to be some epithelialization. Her intake nurse noted that the Endoform seemed to be placed over the wound rather than in the deeper divots they could really benefit from the Endoform. At this point I have no plans to change the Endoform as at one point this was at least 33% exposed bone and the rest of the wound very close to the underlying phalanx 05/14/17; no  major change in the size or appearance of the wound. We have been using Endoform for a prolonged period of time with reasonably good improvement in the epithelialization i.e. no exposed bone especially proximally. Unless we've not made a lot of changes in the last several weeks. She does not complain of pain. She was revascularized early this year or perhaps late last year by Dr. Bridgett Larsson and I wonder if he will need to have a look at her, I will need to review the actual vascular procedure which I think was a distal popliteal bypass 05/21/17; switched to either for a blue last week. Patient arrived in clinic with the wound not measuring any better but the surface looking some better. Unfortunately she had some surface slough and when I went to remove this superiorly she had exposed bone. Previously she had had exposed bone in the inferior part of the wound however this is granulated over some weeks ago. Clearly this is a major step back for her. With some difficulty I was able to obtain a piece of the bone probing through the superior part of the wound for culture. We did not send pathology. It is likely that she is going to need imaging of this site but I did not order this today 05/28/17; bone culture I took last week showed MSSA. She still has exposed  bone however I could not get another piece for pathology. She had an MRI 4 months ago that did not show osteoarthritic at time. Wound rapidly deteriorated superiorly and I suspect this is underlying osteomyelitis. We have been using Hydrofera Blue 06/04/17 on evaluation today patient's wound appears to actually be doing a little bit better visually compared to last week's evaluation which is good news. Nonetheless she does have osteomyelitis of the toe which does not appear to be MRSA. No fevers, chills, nausea, or vomiting noted at this time. She is having no discomfort. 06/11/17; patient's wound looks a little healthier than I remember seeing it 2 weeks ago. There is no exposed bone and the surface of the wound appears well granulated. We've been using hydrofera blue. I note that she was started on doxycycline which was MSSA. Her appointment with Dr. Ola Spurr is on November 12 I believe. 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 Vallarie Mare of vascular surgery on 11/9. He offered right leg angiography possible intervention however the patient refused. I've discussed this with the patient's husband today she did not want any further procedures. Also noteworthy that Dr. Vallarie Mare stated that this wound had not healed in 3 years, I was not aware of this degree of chronicity in this area. She has underlying osteomyelitis by bone culture. Culture of this grew MSSA, she is allergic to penicillin and therefore not a candidate for beta lactams. She saw Dr. Ola Spurr of infectious disease this morning, he continued her on doxycycline for another month and apparently sent in a prescription. We have been using Hydrofera Blue. ABI's update by Dr. Bridgett Larsson right 0.62, left 0.89. Monophasic wave forms 06/25/17 on evaluation today patient appears to be doing well in regard to her right great toe wound. She is still taking the doxycycline as prescribed by Dr. Ola Spurr. The Hydrofera Blue  Dressing's to be doing as well as anything has for her up to this point and we are still waiting on an insurance authorization for the [place]. She is having no pain which is good news. No fevers, chills, nausea, or vomiting noted at this time. 07/02/17;  still taking doxycycline as prescribed by Dr. Ola Spurr. Hydrofera Blue continues. We have no palpable bone. Still have not had had approval of [employer] Electronic Signature(s) Signed: 07/02/2017 5:42:06 PM By: Linton Ham MD Entered By: Linton Ham on 07/02/2017 10:31:15 Loiseau, [name] ([id number]) -------------------------------------------------------------------------------- Physical Exam Details Patient Name: [name], [name] Date of Service: 07/02/2017 10:15 AM Medical Record Number: [id number] Patient Account Number: [id number] Date of Birth/Sex: 1941-01-15 (76 y.o. Female) Treating RN: Roger Shelter Primary Care Provider: Beverlyn Roux Other Clinician: Referring Provider: Beverlyn Roux Treating Provider/Extender: Tito Dine in Treatment: 34 Cardiovascular Edema present in both extremities. 3+. Notes Exam; patient has reasonably healthy looking granulation. Again necrotic debris covering the wound that requires debridement with a #5 curet. Hemostasis with direct pressure. Patient tolerates this well. There is no evidence of surrounding infection Electronic Signature(s) Signed: 07/02/2017 5:42:06 PM By: Linton Ham MD Entered By: Linton Ham on 07/02/2017 10:32:15 Mas, [name] ([id number]) -------------------------------------------------------------------------------- Physician Orders Details Patient Name: [name], [name] Date of Service: 07/02/2017 10:15 AM Medical Record Number: [id number] Patient Account Number: [id number] Date of Birth/Sex: 29-Jun-1941 (76 y.o. Female) Treating RN: Roger Shelter Primary Care Provider: Beverlyn Roux Other Clinician: Referring Provider: Beverlyn Roux Treating Provider/Extender: Tito Dine in Treatment: 36 Verbal / Phone Orders: No Diagnosis Coding Wound Cleansing Wound #7 Right,Dorsal Metatarsal head first o Clean wound with Normal Saline. o Cleanse wound with mild soap and water Anesthetic Wound #7 Right,Dorsal Metatarsal head first o Topical Lidocaine 4% cream applied to wound bed prior to debridement Primary Wound Dressing Wound #7 Right,Dorsal Metatarsal head first o Hydrafera Blue Secondary Dressing Wound #7 Right,Dorsal Metatarsal head first o ABD pad o Conform/Kerlix Dressing Change Frequency Wound #7 Right,Dorsal Metatarsal head first o Change Dressing Monday, Wednesday, Friday Follow-up Appointments Wound #7 Right,Dorsal Metatarsal head first o Return Appointment in 1 week. Edema Control Wound #7 Right,Dorsal Metatarsal head first o Elevate legs to the level of the heart and pump ankles as often as possible Additional Orders / Instructions Wound #7 Right,Dorsal Metatarsal head first o Increase protein intake. o Activity as tolerated Home Health Wound #7 Right,Dorsal Metatarsal head first o Continue Home Health Visits - [facility] weekly, [facility] o Home Health Nurse may visit PRN to address patientos wound care needs. o FACE TO FACE ENCOUNTER: MEDICARE and MEDICAID PATIENTS: I certify that this patient is under my care and that I had a face-to-face encounter that meets the physician face-to-face encounter requirements with this [name], [name] ([id number]) patient on this date. The encounter with the patient was in whole or in part for the following MEDICAL CONDITION: (primary reason for [place]) MEDICAL NECESSITY: I certify, that based on my findings, NURSING services are a medically necessary home health service. HOME BOUND STATUS: I certify that my clinical findings support that this patient is homebound (i.e., Due to illness or  injury, pt requires aid of supportive devices such as crutches, cane, wheelchairs, walkers, the use of special transportation or the assistance of another person to leave their place of residence. There is a normal inability to leave the home and doing so requires considerable and taxing effort. Other absences are for medical reasons / religious services and are infrequent or of short duration when for other reasons). o If current dressing causes regression in wound condition, may D/C ordered dressing product/s and apply Normal Saline Moist Dressing daily until next [place] / Other MD appointment. [place] of regression  in wound condition at [phone]. o Please direct any NON-WOUND related issues/requests for orders to patient's Primary Care Physician Medications-please add to medication list. Wound #7 Right,Dorsal Metatarsal head first o P.O. Antibiotics - continue antibiotics as prescribed Electronic Signature(s) Signed: 07/02/2017 4:54:07 PM By: Roger Shelter Signed: 07/02/2017 5:42:06 PM By: Linton Ham MD Entered By: Roger Shelter on 07/02/2017 10:26:27 Falero, [name] ([id number]) -------------------------------------------------------------------------------- Problem List Details Patient Name: Nation, [place] V. Date of Service: 07/02/2017 10:15 AM Medical Record Number: [id number] Patient Account Number: [id number] Date of Birth/Sex: May 21, 1941 (76 y.o. 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 11/06/2016 Yes E11.52 Type 2 diabetes 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 Date of Service: 07/02/2017 10:15 AM Medical Record Number: [id number] Patient Account Number: [id number] Date of Birth/Sex: January 17, 1941 (76 y.o. Female) Treating RN: Roger Shelter Primary Care Provider: Beverlyn Roux Other Clinician: Referring Provider: Beverlyn Roux Treating Provider/Extender: Tito Dine in Treatment: 34 Subjective History of Present Illness (HPI) 08/13/16: This is a 76 year old woman who came predominantly for review of 3 cm in diameter circular wound to the left anterior lateral leg. She was in the ER on 08/01/16 I reviewed their notes. There was apparently pus coming out of the wound at that time and the patient arrived requesting debridement which they don't do in the emergency room. Nevertheless I can't see that they did any x-rays. There were no cultures done. She is a type II diabetic and I a note after the patient was in the clinic that she had a bypass graft from the popliteal to the tibial on the right on 02/28/16. She also had a right greater saphenous vein harvest on the same date for arterial bypass. She is going to have vascular studies including  ABIs T ABIs on the right on 08/28/16. The patient's surgery was on 02/28/16 by Dr. Vallarie Mare she had a right below the knee popliteal artery to peroneal artery bypass with reverse greater saphenous vein and an endarterectomy of the mid segment peroneal artery. Postoperatively she had a strong mild monophasic peroneal signal with a pink foot. It would appear that the patient is had some nonhealing in the surgical saphenous vein harvest site on the left leg. Surprisingly looking through [facility] I cannot see much information about this at all. Dr. Lucious Groves notes from 05/29/16 show that the patient's wounds "are not healed" the right first metatarsal wound healed but then opened back up. 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 At that point the patient had a wound VAC to her  right calf with regards to the wounds on her dorsal right toe would appear that these are felt to be arterial wounds. She has had surgery on the metatarsal phalangeal in 2015 I Dr. Doran Durand secondary to a right metatarsal phalangeal joint fracture. She is apparently had discoloration around this area since then. 08/28/16; patient arrives with her wounds in much the same condition. The linear vein harvest site and the circular wound below it which I think was a blister. She also has to probing holes in her right great toe and a necrotic eschar on the right second toe. Because of these being arterial wounds I reduced her compression from 3-2 layers this seems to of done satisfactorily she has not had any problems. I cannot see that she is actually had an x-ray ====== 11/06/16 the patient comes in for evaluation of her right lower extremity ulcers. She was here in January 2018 for 2 visits subsequently ended up in the hospital with pneumonia and then to rehabilitation. She has now been discharged from rehabilitation and is home. She  has multiple ulcerations to her right lower extremity including the foot and toes. She does have home health in place and they have been placing alginate to the ulcers. She is followed by Dr. Bridgett Larsson of vascular medicine. 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 She recently saw him on 3/23. In office ABIs were: Right 0.48 with monophasic flow to the DP, PT, peroneal Left 0.63 with monophasic flow to the DP and PT Her arterial studies indicated a patent right below knee popliteal to peroneal bypass She had an MRI in February 2008 that was negative frosty myelitis but this showed general soft tissue edema in the right foot and lower extremity concerning for cellulitis She is a diabetic, managed with insulin. Her hemoglobin A1c in December 2017 was 8.4 which is a trend up from previous levels. She had blood work in February 2018 which revealed an albumin of 2.6 this appears to be relatively acute as an albumin in November 2017 was 3.7 11/13/16; this is a patient I have not seen since February who is readmitted to our clinic last week. 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 Bridgett Larsson. I have reviewed Dr. Lianne Moris notes from March/23/18. Doppler ABI on that date showed an ABI on the right of 0.48 and on the left of 0.63. Dorsalis pedis waveforms were monophasic bilaterally. There was no waveforms detected at the posterior tibial on the right, monophasic on the left. Dr. Lianne Moris comments were that this patient would have follow-up vascular studies in 3 months including ABIs and right lower extremity arterial duplex. She had an MRI in February that was negative for osteomyelitis but showed generalized edema in Doyle, Jennel V. ([id number]) the foot. Last albumin I see was in January at 3.4 we have been using Santyl to the 4 wounds on the right leg the patient is noted today  to have widespread edema well up towards her groin this is pitting 2-3+. 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 Normal ejection fraction. 11/20/16; patient has a follow-up appointment with Dr. Bridgett Larsson on April 23. She is still complaining of a lot of pain in the right foot and right leg. It is not clear to me that this is at all positional however I  think it is clear claudication with minimal activity perhaps at rest. At our suggestion she is return to her primary physician's office tomorrow with regards to her pitting lower extremity bilateral edema that I reviewed in detail last week 11/27/16; the follow-up with Dr. Bridgett Larsson was actually on May 23 on April 23 as I stated in my note last week. N/A case all of her wounds seems somewhat smaller. The 2 on the right leg are definitely smaller. The areas on the dorsal right first toe, right third toe and the lateral part of the right fifth metatarsal head all looks smaller but have tightly adherent surfaces. We have been using Santyl 12/04/16; follow-up with Dr. Bridgett Larsson on May 23. 2 small open wounds on the right leg continue to get smaller. The area on the right third total lateral aspect of the right fifth toe also look better. The remaining area on the dorsal first toe still has some depth to it. We have been using Santyl to the toes and collagen on the right 12/11/16; according to patient's husband the follow-up with Dr. Bridgett Larsson is not until July. All of the wounds on the right leg are measuring smaller. We have been using some combination of Prisma and Santyl although I think we can go to straight Prisma today. There may have been some confusion with home health about the primary dressing orders here. 12/25/16; the patient has had some healing this week. The area on her right lateral fifth metatarsal head, right third toe are both healed lower right leg is healed. In the vein harvest site superiorly she  has one superficial open area. On the dorsal aspect of her right great toe/MTP joint the wound is now divided into 2 however the proximal area is deep and there is palpable bone 01/01/17; still an open area in the middle of her original right surgical scar. The area on the right third toe and right lateral fifth metatarsal head remained closed. Problematic area on the dorsal aspect of the toe. Previous surgery in this area line 01/08/17 small open area on the original scar on her upper anterior leg although this is closing. X-ray I did of the right first toe did not show underlying bony abnormality. Still this area on the dorsal first toe probes to bone. We have been using Prisma 01/15/17; small open area in the original scar in the upper anterior leg is almost fully closed. She has 2 open areas over the dorsal aspect of the right first toe that probes to bone. Used and a form starting last week. Vigorous bone scraping that I did last week showed few methicillin sensitive staph aureus. She is allergic to penicillin and sulfa drugs. I'm going to give her 2 weeks of doxycycline. She will need an MRI with contrast. 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 She continues on doxycycline for a bone scraping that showed methicillin sensitive staph aureus [allergic to penicillin and sulfa]. We have been using Endo form to the wound. 01/29/17; surprisingly her MRI did not show osteomyelitis. She continues on doxycycline for a bone scraping that showed methicillin sensitive staph aureus with allergies to penicillin and sulfa. We have been using Endo form to the wound. Unfortunately I cannot get a surface on this visit looks like it is able to support a healing state. Proximally there is still exposed bone. There is no overt soft tissue tenderness. Her  MRI did show a previous fracture was surgery to this area but no hardware 02/12/17 on  evaluation today patient appears to be doing well in regard to her lower extremity wound. She does have some mild discomfort but this is minimal. There's no evidence of infection. Her left great toe nail has somewhat been lifted up and there is a little bit of slight bleeding underneath but this is still firmly attached. 02/19/17; she ran out of doxycycline 2 days ago. Nevertheless I would like to continue this for 2 weeks to make a full 6 weeks of therapy as the bone scraping that I did from the open area on the dorsal right first toe showed MRSA. This should complete antibiotic therapy. 02/26/17; the patient is completing her 6 weeks of oral doxycycline. Deep wound on the dorsal right toe. This still has some exposed bone proximally. She does have a reasonably granulated surface albeit thin over bone. I've been using Endo form have made application for an amniotic skin sub 03/05/17; the patient has completed 6 weeks of doxycycline. This is a deep wound on the dorsal right toe which has exposed bone proximally. She has granulation over most of this wound albeit a thin layer. I've been using Endo form. Still do not have approval for [employer] 03/13/17 on evaluation today patient's right foot wound appears to be doing better measurement wise compared to her last evaluation. She has been tolerating the dressing change without complication and does have a appointment with the dietary that has been made as far as referral is concerned there just waiting for her and transportation to contact them back for an actual date. Nonetheless patient has been having less pain at this point. No fevers, chills, nausea, or vomiting noted at this time. 03/26/17; linear wound over the dorsal aspect of her right great toe. At one point this had exposed bone on the most superior aspect however I am pleased to see today that this appears to have a surface of granulation. Apparently product was applied for but denied by [facility].  We'll need to see what that was. The patient has known PAD status post revascularization. MRI did not show osteomyelitis which was done in June 04/02/17; continued improvement using Endoform. She was approved for [employer] but hasn't over $258 co-pay per application, [name], [name] ([id number]) this is beyond her means 04/09/17; continues on Endoform. 04/16/17; appears to be doing nicely continuing on Endoform 04/22/17; patient did not have her dressing changed all week because of the weather. Using Endoform. Base of the wound looks healthy elbow there appears to events surrounding maceration perhaps because of the drainage was not changing the wound dressing 04/30/17 wound today continues to close and except for a small divot on the superior part of the wound. This area did not probe the bone but I found this a little concerning as this was the area with exposed bone before we be able to get this to granulate forward. As I remember things she is not a candidate for skin substitutes secondary to an outlandish co-pay. I asked her husband to look into the out of pocket max for medications if he has 1 [Regranex] or totally for skin substitutes 05/07/17; the patient arrives today with a wound roughly the same size although under careful inspection under the light there appears to be some epithelialization. Her intake nurse noted that the Endoform seemed to be placed over the wound rather than in the deeper divots they could really benefit from the  Endoform. At this point I have no plans to change the Endoform as at one point this was at least 33% exposed bone and the rest of the wound very close to the underlying phalanx 05/14/17; no major change in the size or appearance of the wound. We have been using Endoform for a prolonged period of time with reasonably good improvement in the epithelialization i.e. no exposed bone especially proximally. Unless we've not made a lot of changes in the last several weeks.  She does not complain of pain. She was revascularized early this year or perhaps late last year by Dr. Bridgett Larsson and I wonder if he will need to have a look at her, I will need to review the actual vascular procedure which I think was a distal popliteal bypass 05/21/17; switched to either for a blue last week. Patient arrived in clinic with the wound not measuring any better but the surface looking some better. Unfortunately she had some surface slough and when I went to remove this superiorly she had exposed bone. Previously she had had exposed bone in the inferior part of the wound however this is granulated over some weeks ago. Clearly this is a major step back for her. With some difficulty I was able to obtain a piece of the bone probing through the superior part of the wound for culture. We did not send pathology. It is likely that she is going to need imaging of this site but I did not order this today 05/28/17; bone culture I took last week showed MSSA. She still has exposed bone however I could not get another piece for pathology. She had an MRI 4 months ago that did not show osteoarthritic at time. Wound rapidly deteriorated superiorly and I suspect this is underlying osteomyelitis. We have been using Hydrofera Blue 06/04/17 on evaluation today patient's wound appears to actually be doing a little bit better visually compared to last week's evaluation which is good news. Nonetheless she does have osteomyelitis of the toe which does not appear to be MRSA. No fevers, chills, nausea, or vomiting noted at this time. She is having no discomfort. 06/11/17; patient's wound looks a little healthier than I remember seeing it 2 weeks ago. There is no exposed bone and the surface of the wound appears well granulated. We've been using hydrofera blue. I note that she was started on doxycycline which was MSSA. Her appointment with Dr. Ola Spurr is on November 12 I believe. She has bone documenting  MSSA osteomyelitis 06/18/17; patient saw Dr. Vallarie Mare of vascular surgery on 11/9. He offered right leg angiography possible intervention however the patient refused. I've discussed this with the patient's husband today she did not want any further procedures. Also noteworthy that Dr. Vallarie Mare stated that this wound had not healed in 3 years, I was not aware of this degree of chronicity in this area. She has underlying osteomyelitis by bone culture. Culture of this grew MSSA, she is allergic to penicillin and therefore not a candidate for beta lactams. She saw Dr. Ola Spurr of infectious disease this morning, he continued her on doxycycline for another month and apparently sent in a prescription. We have been using Hydrofera Blue. ABI's update by Dr. Bridgett Larsson right 0.62, left 0.89. Monophasic wave forms 06/25/17 on evaluation today patient appears to be doing well in regard to her right great toe wound. She is still taking the doxycycline as prescribed by Dr. Ola Spurr. The Hydrofera Blue Dressing's to be doing as well as anything has for  her up to this point and we are still waiting on an insurance authorization for the [place]. She is having no pain which is good news. No fevers, chills, nausea, or vomiting noted at this time. 07/02/17; still taking doxycycline as prescribed by Dr. Ola Spurr. Hydrofera Blue continues. We have no palpable bone. Still have not had had approval of [employer] [name], [name] ([id number]) Objective Constitutional Vitals Time Taken: 10:00 AM, Height: 64 in, Weight: 200 lbs, BMI: 34.3, Temperature: 98.5 F, Pulse: 78 bpm, Respiratory Rate: 16 breaths/min, Blood Pressure: 129/77 mmHg. Cardiovascular Edema present in both extremities. 3+. General Notes: Exam; patient has reasonably healthy looking granulation. Again necrotic debris covering the wound that requires debridement with a #5 curet. Hemostasis with direct pressure. Patient tolerates this well. There is no evidence  of surrounding infection Integumentary (Hair, Skin) Wound #7 status is Open. Original cause of wound was Gradually Appeared. The wound is located on the Right,Dorsal Metatarsal head first. The wound measures 1.9cm length x 0.9cm width x 0.2cm depth; 1.343cm^2 area and 0.269cm^3 volume. There is Fat Layer (Subcutaneous Tissue) Exposed exposed. There is a large amount of serous drainage noted. The wound margin is flat and intact. There is medium (34-66%) red granulation within the wound bed. There is a medium (34- 66%) amount of necrotic tissue within the wound bed including Adherent Slough. The periwound skin appearance exhibited: Induration, Hemosiderin Staining. The periwound skin appearance did not exhibit: Callus, Crepitus, Excoriation, Rash, Scarring, Dry/Scaly, Maceration, Atrophie Blanche, Cyanosis, Ecchymosis, Mottled, Pallor, Rubor, Erythema. The periwound has tenderness on palpation. 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 There was a Skin/Subcutaneous Tissue Debridement ([id number]) debridement with total area of 1.71 sq cm performed by Ricard Dillon, MD. with the following  instrument(s): Curette to remove Viable and Non-Viable tissue/material including Fibrin/Slough, Eschar, and Subcutaneous after achieving pain control using Other (lidocaine 4%). A time out was conducted at 10:24, prior to the start of the procedure. A Minimum amount of bleeding was controlled with Pressure. The procedure was tolerated well with a pain level of 0 throughout and a pain level of 0 following the procedure. Post Debridement Measurements: 1.9cm length x 0.9cm [name], [name] ([id number]) width x 0.2cm depth; 0.269cm^3 volume. Character of Wound/Ulcer Post Debridement is stable. Severity of Tissue Post Debridement is: Fat layer exposed. Post procedure Diagnosis Wound #7: Same as Pre-Procedure Plan Wound Cleansing: Wound #7 Right,Dorsal Metatarsal head first: Clean wound with Normal Saline. Cleanse wound with mild soap and water Anesthetic: Wound #7 Right,Dorsal Metatarsal head first: Topical Lidocaine 4% cream applied to wound bed prior to debridement Primary Wound Dressing: Wound #7 Right,Dorsal Metatarsal head first: Hydrafera Blue Secondary Dressing: Wound #7 Right,Dorsal Metatarsal head first: ABD pad Conform/Kerlix Dressing Change Frequency: Wound #7 Right,Dorsal Metatarsal head first: Change Dressing  Monday, Wednesday, Friday Follow-up Appointments: Wound #7 Right,Dorsal Metatarsal head first: Return Appointment in 1 week. Edema Control: Wound #7 Right,Dorsal Metatarsal head first: Elevate legs to the level of the heart and pump ankles as often as possible Additional Orders / Instructions: Wound #7 Right,Dorsal Metatarsal head first: Increase protein intake. Activity as tolerated Home Health: Wound #7 Right,Dorsal Metatarsal head first: [facility] twice weekly, [facility] Home Health Nurse may visit PRN to address patient s wound care needs. FACE TO FACE ENCOUNTER: MEDICARE and MEDICAID PATIENTS: I certify that this  patient is under my care and that I had a face-to-face encounter that meets the physician face-to-face encounter requirements with this patient on this date. The encounter with the patient was in whole or in part for the following MEDICAL CONDITION: (primary reason for [place]) MEDICAL NECESSITY: I certify, that based on my findings, NURSING services are a medically necessary home health service. HOME BOUND STATUS: I certify that my clinical findings support that this patient is homebound (i.e., Due to illness or injury, pt requires aid of supportive devices such as crutches, cane, wheelchairs, walkers, the use of special transportation or the assistance of another person to leave their place of residence. There is a normal inability to leave the home and doing so requires considerable and taxing effort. Other absences are for medical reasons / religious services and are infrequent or of short duration when for other reasons). If current dressing causes regression in wound condition, may D/C ordered dressing product/s and apply Normal Saline Moist Dressing daily until next [place] / Other MD appointment. Cecil of regression in wound condition at [phone]. Please direct any NON-WOUND related issues/requests for orders to patient's Primary Care Physician Medications-please add to medication list.: Wound #7 Right,Dorsal Metatarsal head first: P.O. Antibiotics - continue antibiotics as prescribed Bradly, Daphney V. ([id number]) 1 continue hydrofera blue Electronic Signature(s) Signed: 07/02/2017 5:42:06 PM By: Linton Ham MD Entered By: Linton Ham on 07/02/2017 10:33:07 Sedam, Jemina [name] ([id number]) -------------------------------------------------------------------------------- SuperBill Details Patient Name: Mongillo, Jelani V. Date of Service: 07/02/2017 Medical Record Number: [id number] Patient Account Number: [id number] Date of Birth/Sex:  03/02/1941 (76 y.o. 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 I70.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, right ankle and foot Facility Procedures CPT4 Code Description: [id number] 11042 - DEB SUBQ TISSUE 20 SQ CM/< ICD-10 Diagnosis Description E11.621 Type 2 diabetes mellitus with foot ulcer L97.519 Non-pressure chronic ulcer of other part of right foot with unspe Modifier: cified severit Quantity: 1 y Physician Procedures CPT4 Code Description: [id number] - WC PHYS SUBQ TISS 20 SQ CM ICD-10 Diagnosis Description E11.621 Type 2 diabetes mellitus with foot ulcer L97.519 Non-pressure chronic ulcer of other part of right foot with unspec Modifier: ified severit Quantity: 1 y Engineer, maintenance) Signed: 07/02/2017 5:42:06 PM By: Linton Ham MD Entered By: Linton Ham on 07/02/2017 10:33:29

## 2017-07-09 ENCOUNTER — Ambulatory Visit: Payer: Medicare HMO | Admitting: Internal Medicine

## 2017-07-10 NOTE — Progress Notes (Signed)
[name] ([id number]) Visit Report for 06/25/2017 Arrival Information Details Patient Name: [name], [name] Date of Service: 06/25/2017 10:15 AM Medical Record Number: [id number] Patient Account Number: [id number] Date of Birth/Sex: September 27, 1940 (76 y.o. Female) Treating RN: Roger Shelter Primary Care Le Faulcon: Beverlyn Roux Other Clinician: Referring Lemoyne Nestor: Beverlyn Roux Treating Cyril Woodmansee/Extender: Melburn Hake, HOYT Weeks in Treatment: 40 Visit Information History Since Last Visit All ordered tests and consults were completed: No Patient Arrived: Wheel Chair Added or deleted any medications: No Arrival Time: 10:07 Any new allergies or adverse reactions: No Accompanied By: husband Had a fall or experienced change in No activities of daily living that may affect Transfer Assistance: None risk of falls: Patient Identification Verified: Yes Signs or symptoms of abuse/neglect since last visito No Secondary Verification Process Completed: Yes Hospitalized since last visit: No Patient Requires Transmission-Based No Pain Present Now: No Precautions: Patient Has Alerts: No Electronic Signature(s) Signed: 06/25/2017 3:20:43 PM By: Roger Shelter Entered By: Roger Shelter on 06/25/2017 10:15:26 [name], [name] ([id number]) -------------------------------------------------------------------------------- Encounter Discharge Information Details Patient Name: [name], [name] Date of Service: 06/25/2017 10:15 AM Medical Record Number: [id number] Patient Account Number: [id number] Date of Birth/Sex: 29-Sep-1940 (76 y.o. Female) Treating RN: Roger Shelter Primary Care Tyger Oka: Beverlyn Roux Other Clinician: Referring Berlinda Farve: Beverlyn Roux Treating Alajah Witman/Extender: Melburn Hake, HOYT Weeks in Treatment: 55 Encounter Discharge Information Items Schedule Follow-up Appointment: No Medication Reconciliation completed and No provided to Patient/Care Leanard Dimaio: Provided on Clinical  Summary of Care: 06/25/2017 Form Type Recipient Paper Patient [facility] Electronic Signature(s) Signed: 07/09/2017 9:57:26 AM By: Ruthine Dose Entered By: Ruthine Dose on 06/25/2017 10:59:52 [name], [name] ([id number]) -------------------------------------------------------------------------------- Lower Extremity Assessment Details Patient Name: [name], [name] Date of Service: 06/25/2017 10:15 AM Medical Record Number: [id number] Patient Account Number: [id number] Date of Birth/Sex: 01-25-1941 (76 y.o. Female) Treating RN: Roger Shelter Primary Care Viridiana Spaid: Beverlyn Roux Other Clinician: Referring Graceson Nichelson: Beverlyn Roux Treating Sulo Janczak/Extender: STONE III, HOYT Weeks in Treatment: 33 Edema Assessment Assessed: [Left: No] [Right: No] Edema: [Left: Ye] [Right: s] Vascular Assessment Claudication: Claudication Assessment [Right:None] Pulses: Dorsalis Pedis Palpable: [Right:Yes] Posterior Tibial Extremity colors, hair growth, and conditions: Extremity Color: [Right:Hyperpigmented] Hair Growth on Extremity: [Right:No] Temperature of Extremity: [Right:Cool] Capillary Refill: [Right:> 3 seconds] Toe Nail Assessment Left: Right: Thick: Yes Discolored: Yes Deformed: Yes Improper Length and Hygiene: Yes Electronic Signature(s) Signed: 06/25/2017 3:20:43 PM By: Roger Shelter Entered By: Roger Shelter on 06/25/2017 10:26:58 [name], [name] ([id number]) -------------------------------------------------------------------------------- Pain Assessment Details Patient Name: [name], [name] Date of Service: 06/25/2017 10:15 AM Medical Record Number: [id number] Patient Account Number: [id number] Date of Birth/Sex: 10/13/1940 (75 y.o. Female) Treating RN: Roger Shelter Primary Care Garcia Dalzell: Beverlyn Roux Other Clinician: Referring Jenee Spaugh: Beverlyn Roux Treating Florean Hoobler/Extender: Melburn Hake, HOYT Weeks in Treatment: 5 Active Problems Location of Pain Severity and Description  of Pain Patient Has Paino No Site Locations Pain Management and Medication Current Pain Management: Electronic Signature(s) Signed: 06/25/2017 3:20:43 PM By: Roger Shelter Entered By: Roger Shelter on 06/25/2017 10:15:35 [name], [name] ([id number]) -------------------------------------------------------------------------------- Patient/Caregiver Education Details Patient Name: [name], [name] Date of Service: 06/25/2017 10:15 AM Medical Record Number: [id number] Patient Account Number: [id number] Date of Birth/Gender: 1941-03-20 (76 y.o. Female) Treating RN: Roger Shelter Primary Care Physician: Beverlyn Roux Other Clinician: Referring Physician: Beverlyn Roux Treating Physician/Extender: Sharalyn Ink in Treatment: 59 Education Assessment Education Provided To: Patient and Caregiver will fax orders to home health Education Topics Provided Wound Debridement: Handouts: Wound Debridement Methods: Explain/Verbal Responses: State content correctly  Wound/Skin Impairment: Handouts: Caring for Your Ulcer Methods: Explain/Verbal Responses: State content correctly Electronic Signature(s) Signed: 06/25/2017 3:20:43 PM By: Roger Shelter Entered By: Roger Shelter on 06/25/2017 11:00:24 [name], [name] ([id number]) -------------------------------------------------------------------------------- Wound Assessment Details Patient Name: [name], [name] Date of Service: 06/25/2017 10:15 AM Medical Record Number: [id number] Patient Account Number: [id number] Date of Birth/Sex: [birth year] (76 y.o. Female) Treating RN: Roger Shelter Primary Care Aleeya Veitch: Beverlyn Roux Other Clinician: Referring Marshawn Normoyle: Beverlyn Roux Treating Morio Widen/Extender: STONE III, HOYT Weeks in Treatment: 33 Wound Status Wound Number: 7 Primary Diabetic Wound/Ulcer of the Lower Extremity Etiology: Wound Location: Right Metatarsal head first - Dorsal Wound Open Wounding Event: Gradually  Appeared Status: Date Acquired: 08/06/2013 Comorbid Cataracts, Chronic sinus problems/congestion, Weeks Of Treatment: 33 History: Congestive Heart Failure, Hypertension, Type II Clustered Wound: No Diabetes Photos Wound Measurements Length: (cm) 1.9 Width: (cm) 0.6 Depth: (cm) 0.2 Area: (cm) 0.895 Volume: (cm) 0.179 % Reduction in Area: 24% % Reduction in Volume: 49.3% Epithelialization: Small (1-33%) Tunneling: No Undermining: No Wound Description Classification: Grade 2 Wound Margin: Flat and Intact Exudate Amount: Large Exudate Type: Serous Exudate Color: amber Foul Odor After Cleansing: No Slough/Fibrino Yes Wound Bed Granulation Amount: Medium (34-66%) Exposed Structure Granulation Quality: Red Fascia Exposed: No Necrotic Amount: Medium (34-66%) Fat Layer (Subcutaneous Tissue) Exposed: Yes Necrotic Quality: Adherent Slough Tendon Exposed: No Muscle Exposed: No Joint Exposed: No Bone Exposed: No Periwound Skin Texture Texture Color No Abnormalities Noted: No No Abnormalities Noted: No Callus: No Atrophie Blanche: No [name], [name] ([id number]) Crepitus: No Cyanosis: No Excoriation: No Ecchymosis: No Induration: Yes Erythema: No Rash: No Hemosiderin Staining: Yes Scarring: No Mottled: No Pallor: No Moisture Rubor: No No Abnormalities Noted: No Dry / Scaly: No Temperature / Pain Maceration: No Tenderness on Palpation: Yes Wound Preparation Ulcer Cleansing: Rinsed/Irrigated with Saline Topical Anesthetic Applied: Other: lidocaine 4%, Electronic Signature(s) Signed: 06/25/2017 3:20:43 PM By: Roger Shelter Entered By: Roger Shelter on 06/25/2017 10:26:16 [name], [name] ([id number]) -------------------------------------------------------------------------------- Vitals Details Patient Name: [name], [name] Date of Service: 06/25/2017 10:15 AM Medical Record Number: [id number] Patient Account Number: [id number] Date of Birth/Sex:  [birth year] (76 y.o. Female) Treating RN: Roger Shelter Primary Care Talise Sligh: Beverlyn Roux Other Clinician: Referring Safina Huard: Beverlyn Roux Treating Leeland Lovelady/Extender: STONE III, HOYT Weeks in Treatment: 33 Vital Signs Time Taken: 10:15 Temperature (F): 97.8 Height (in): 64 Pulse (bpm): 78 Weight (lbs): 200 Respiratory Rate (breaths/min): 16 Body Mass Index (BMI): 34.3 Blood Pressure (mmHg): 142/47 Reference Range: 80 - 120 mg / dl Electronic Signature(s) Signed: 06/25/2017 3:20:43 PM By: Roger Shelter Entered By: Roger Shelter on 06/25/2017 10:16:06

## 2017-07-16 ENCOUNTER — Ambulatory Visit: Payer: Medicare HMO | Admitting: Internal Medicine

## 2017-07-22 ENCOUNTER — Encounter: Payer: Medicare HMO | Admitting: Podiatry

## 2017-07-23 ENCOUNTER — Encounter: Payer: Medicare HMO | Attending: Internal Medicine | Admitting: Internal Medicine

## 2017-07-23 DIAGNOSIS — M86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, right ankle and foot: Secondary | ICD-10-CM | POA: Diagnosis not present

## 2017-07-23 DIAGNOSIS — E1152 Type 2 diabetes mellitus with diabetic peripheral angiopathy with gangrene: Secondary | ICD-10-CM | POA: Diagnosis not present

## 2017-07-23 DIAGNOSIS — I70232 Atherosclerosis 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 of calf: Secondary | ICD-10-CM | POA: Diagnosis not present

## 2017-07-23 DIAGNOSIS — L97519 Non-pressure chronic ulcer of other part of right foot with unspecified severity: Secondary | ICD-10-CM | POA: Insufficient documentation

## 2017-07-23 DIAGNOSIS — I70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 of foot: Secondary | ICD-10-CM | POA: Insufficient documentation

## 2017-07-23 DIAGNOSIS — E11621 Type 2 diabetes mellitus with foot ulcer: Secondary | ICD-10-CM | POA: Insufficient documentation

## 2017-07-23 DIAGNOSIS — Z880 Allergy status to penicillin: Secondary | ICD-10-CM | POA: Diagnosis not present

## 2017-07-23 DIAGNOSIS — L97219 Non-pressure chronic ulcer of right calf with unspecified severity: Secondary | ICD-10-CM | POA: Diagnosis not present

## 2017-07-23 DIAGNOSIS — E11622 Type 2 diabetes mellitus with other skin ulcer: Secondary | ICD-10-CM | POA: Diagnosis not present

## 2017-07-26 NOTE — Progress Notes (Signed)
This encounter was created in error - please disregard.

## 2017-07-27 NOTE — Progress Notes (Signed)
MYLANI, GENTRY ([id number]) Visit Report for 07/23/2017 Debridement Details Patient Name: [name], [name] Date of Service: 07/23/2017 12:30 PM Medical Record Number: [id number] Patient Account Number: [id number] Date of Birth/Sex: Dec 07, 1940 (76 y.o. Female) Treating RN: Cornell Barman Primary Care Provider: Beverlyn Roux Other Clinician: Referring Provider: Beverlyn Roux Treating Provider/Extender: Tito Dine in Treatment: 37 Debridement Performed for Wound #7 Right,Dorsal Metatarsal head first Assessment: Performed By: Physician Ricard Dillon, MD Debridement: Debridement Severity of Tissue Pre Fat layer exposed Debridement: Pre-procedure Verification/Time Yes - 13:00 Out Taken: Start Time: 13:00 Pain Control: Other : lidocaine 4% Level: Skin/Subcutaneous Tissue Total Area Debrided (L x W): 1.9 (cm) x 0.6 (cm) = 1.14 (cm) Tissue and other material Viable, Non-Viable, Fibrin/Slough, Subcutaneous debrided: Instrument: Curette Bleeding: Minimum Hemostasis Achieved: Pressure End Time: 13:05 Procedural Pain: 1 Post Procedural Pain: 3 Response to Treatment: Procedure was tolerated well Post Debridement Measurements of Total Wound Length: (cm) 1.9 Width: (cm) 0.6 Depth: (cm) 0.2 Volume: (cm) 0.179 Character of Wound/Ulcer Post Debridement: Requires Further Debridement Severity of Tissue Post Debridement: Fat layer exposed Post Procedure Diagnosis Same as Pre-procedure Electronic Signature(s) Signed: 07/24/2017 9:07:13 AM By: Gretta Cool, BSN, RN, CWS, Kim RN, BSN Signed: 07/27/2017 7:28:22 AM By: Linton Ham MD Entered By: Linton Ham on 07/23/2017 14:37:42 [name], [name] ([id number]) -------------------------------------------------------------------------------- HPI Details Patient Name: [name], [name] Date of Service: 07/23/2017 12:30 PM Medical Record Number: [id number] Patient Account Number: [id number] Date of Birth/Sex: 09/27/1940 (76 y.o.  Female) Treating RN: Cornell Barman Primary Care Provider: Beverlyn Roux Other Clinician: Referring Provider: Beverlyn Roux Treating Provider/Extender: Tito Dine in Treatment: 37 History of Present Illness HPI Description: 08/13/16: This is a 76 year old woman who came predominantly for review of 3 cm in diameter circular wound to the left anterior lateral leg. She was in the ER on 08/01/16 I reviewed their notes. There was apparently pus coming out of the wound at that time and the patient arrived requesting debridement which they don't do in the emergency room. Nevertheless I can't see that they did any x-rays. There were no cultures done. She is a type II diabetic and I a note after the patient was in the clinic that she had a bypass graft from the popliteal to the tibial on the right on 02/28/16. She also had a right greater saphenous vein harvest on the same date for arterial bypass. She is going to have vascular studies including ABIs T ABIs on the right on 08/28/16. The patient's surgery was on 02/28/16 by Dr. Vallarie Mare she had a right below the knee popliteal artery to peroneal artery bypass with reverse greater saphenous vein and an endarterectomy of the mid segment peroneal artery. Postoperatively she had a strong mild monophasic peroneal signal with a pink foot. It would appear that the patient is had some nonhealing in the surgical saphenous vein harvest site on the left leg. Surprisingly looking through [facility] I cannot see much information about this at all. Dr. Lucious Groves notes from 05/29/16 show that the patient's wounds "are not healed" the right first metatarsal wound healed but then opened back up. 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 At that point the patient had a wound VAC to her right calf with regards to the wounds on her dorsal right toe would appear that these  are felt to be arterial wounds. She has had surgery on the metatarsal phalangeal in  2015 I Dr. Doran Durand secondary to a right metatarsal phalangeal joint fracture. She is apparently had discoloration around this area since then. 08/28/16; patient arrives with her wounds in much the same condition. The linear vein harvest site and the circular wound below it which I think was a blister. She also has to probing holes in her right great toe and a necrotic eschar on the right second toe. Because of these being arterial wounds I reduced her compression from 3-2 layers this seems to of done satisfactorily she has not had any problems. I cannot see that she is actually had an x-ray ====== 11/06/16 the patient comes in for evaluation of her right lower extremity ulcers. She was here in January 2018 for 2 visits subsequently ended up in the hospital with pneumonia and then to rehabilitation. She has now been discharged from rehabilitation and is home. She has multiple ulcerations to her right lower extremity including the foot and toes. She does have home health in place and they have been placing alginate to the ulcers. She is followed by Dr. Bridgett Larsson of vascular medicine. 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 She recently saw him on 3/23. In office ABIs were: Right 0.48 with monophasic flow to the DP, PT, peroneal Left 0.63 with monophasic flow to the DP and PT Her arterial studies indicated a patent right below knee popliteal to peroneal bypass She had an MRI in February 2008 that was negative frosty myelitis but this showed general soft tissue edema in the right foot and lower extremity concerning for cellulitis She is a diabetic, managed with insulin. Her hemoglobin A1c in December 2017 was 8.4 which is a trend up from previous levels. She had blood work in February 2018 which revealed an albumin of 2.6 this appears to be relatively  acute as an albumin in November 2017 was 3.7 11/13/16; this is a patient I have not seen since February who is readmitted to our clinic last week. 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 Bridgett Larsson. I have reviewed Dr. Lianne Moris notes from March/23/18. Doppler ABI on that date showed an ABI on the right of 0.48 and on the left of 0.63. Dorsalis pedis waveforms were monophasic bilaterally. There was no waveforms detected at the posterior tibial on the right, monophasic on the left. Dr. Lianne Moris comments were that this patient would have follow-up vascular studies in 3 months including ABIs and right lower extremity arterial duplex. She had an MRI in February that was negative for osteomyelitis but showed generalized edema in the foot. Last albumin I see was in January at 3.4 we have been using Santyl to the 4 wounds on the right leg Abbs, Quynn V. ([id number]) the patient is noted today to have widespread edema well up towards her groin this is pitting 2-3+. 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 Normal ejection fraction. 11/20/16; patient has a follow-up appointment with Dr. Bridgett Larsson on April 23. She is still complaining of a lot of pain in the right foot and right leg. It is not clear to me that this is at all positional however I think it is clear claudication with minimal activity perhaps at rest. At our suggestion she is return to her primary physician's office tomorrow with regards to her pitting lower extremity bilateral edema  that I reviewed in detail last week 11/27/16; the follow-up with Dr. Bridgett Larsson was actually on May 23 on April 23 as I stated in my note last week. N/A case all of her wounds seems somewhat smaller. The 2 on the right leg are definitely smaller. The areas on the dorsal right first toe, right third toe and the lateral part of the right fifth metatarsal head all  looks smaller but have tightly adherent surfaces. We have been using Santyl 12/04/16; follow-up with Dr. Bridgett Larsson on May 23. 2 small open wounds on the right leg continue to get smaller. The area on the right third total lateral aspect of the right fifth toe also look better. The remaining area on the dorsal first toe still has some depth to it. We have been using Santyl to the toes and collagen on the right 12/11/16; according to patient's husband the follow-up with Dr. Bridgett Larsson is not until July. All of the wounds on the right leg are measuring smaller. We have been using some combination of Prisma and Santyl although I think we can go to straight Prisma today. There may have been some confusion with home health about the primary dressing orders here. 12/25/16; the patient has had some healing this week. The area on her right lateral fifth metatarsal head, right third toe are both healed lower right leg is healed. In the vein harvest site superiorly she has one superficial open area. On the dorsal aspect of her right great toe/MTP joint the wound is now divided into 2 however the proximal area is deep and there is palpable bone 01/01/17; still an open area in the middle of her original right surgical scar. The area on the right third toe and right lateral fifth metatarsal head remained closed. Problematic area on the dorsal aspect of the toe. Previous surgery in this area line 01/08/17 small open area on the original scar on her upper anterior leg although this is closing. X-ray I did of the right first toe did not show underlying bony abnormality. Still this area on the dorsal first toe probes to bone. We have been using Prisma 01/15/17; small open area in the original scar in the upper anterior leg is almost fully closed. She has 2 open areas over the dorsal aspect of the right first toe that probes to bone. Used and a form starting last week. Vigorous bone scraping that I did last week showed few methicillin  sensitive staph aureus. She is allergic to penicillin and sulfa drugs. I'm going to give her 2 weeks of doxycycline. She will need an MRI with contrast. 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 She continues on doxycycline for a bone scraping that showed methicillin sensitive staph aureus [allergic to penicillin and sulfa]. We have been using Endo form to the wound. 01/29/17; surprisingly her MRI did not show osteomyelitis. She continues on doxycycline for a bone scraping that showed methicillin sensitive staph aureus with allergies to penicillin and sulfa. We have been using Endo form to the wound. Unfortunately I cannot get a surface on this visit looks like it is able to support a healing state. Proximally there is still exposed bone. There is no overt soft tissue tenderness. Her MRI did show a previous fracture was surgery to this area but no hardware 02/12/17 on evaluation today patient appears to be doing well in regard to her lower extremity wound.  She does have some mild discomfort but this is minimal. There's no evidence of infection. Her left great toe nail has somewhat been lifted up and there is a little bit of slight bleeding underneath but this is still firmly attached. 02/19/17; she ran out of doxycycline 2 days ago. Nevertheless I would like to continue this for 2 weeks to make a full 6 weeks of therapy as the bone scraping that I did from the open area on the dorsal right first toe showed MRSA. This should complete antibiotic therapy. 02/26/17; the patient is completing her 6 weeks of oral doxycycline. Deep wound on the dorsal right toe. This still has some exposed bone proximally. She does have a reasonably granulated surface albeit thin over bone. I've been using Endo form have made application for an amniotic skin sub 03/05/17; the patient has completed 6 weeks of doxycycline. This is a deep wound on the  dorsal right toe which has exposed bone proximally. She has granulation over most of this wound albeit a thin layer. I've been using Endo form. Still do not have approval for Affinity 03/13/17 on evaluation today patient's right foot wound appears to be doing better measurement wise compared to her last evaluation. She has been tolerating the dressing change without complication and does have a appointment with the dietary that has been made as far as referral is concerned there just waiting for her and transportation to contact them back for an actual date. Nonetheless patient has been having less pain at this point. No fevers, chills, nausea, or vomiting noted at this time. 03/26/17; linear wound over the dorsal aspect of her right great toe. At one point this had exposed bone on the most superior aspect however I am pleased to see today that this appears to have a surface of granulation. Apparently product was applied for but denied by [facility]. We'll need to see what that was. The patient has known PAD status post revascularization. MRI did not show osteomyelitis which was done in June 04/02/17; continued improvement using Endoform. She was approved for [employer] but hasn't over $751 co-pay per application, this is beyond her means [name], [name] ([id number]) 04/09/17; continues on Endoform. 04/16/17; appears to be doing nicely continuing on Endoform 04/22/17; patient did not have her dressing changed all week because of the weather. Using Endoform. Base of the wound looks healthy elbow there appears to events surrounding maceration perhaps because of the drainage was not changing the wound dressing 04/30/17 wound today continues to close and except for a small divot on the superior part of the wound. This area did not probe the bone but I found this a little concerning as this was the area with exposed bone before we be able to get this to granulate forward. As I remember things she is not a candidate  for skin substitutes secondary to an outlandish co-pay. I asked her husband to look into the out of pocket max for medications if he has 1 [Regranex] or totally for skin substitutes 05/07/17; the patient arrives today with a wound roughly the same size although under careful inspection under the light there appears to be some epithelialization. Her intake nurse noted that the Endoform seemed to be placed over the wound rather than in the deeper divots they could really benefit from the Endoform. At this point I have no plans to change the Endoform as at one point this was at least 33% exposed bone and the rest of the wound very close  to the underlying phalanx 05/14/17; no major change in the size or appearance of the wound. We have been using Endoform for a prolonged period of time with reasonably good improvement in the epithelialization i.e. no exposed bone especially proximally. Unless we've not made a lot of changes in the last several weeks. She does not complain of pain. She was revascularized early this year or perhaps late last year by Dr. Bridgett Larsson and I wonder if he will need to have a look at her, I will need to review the actual vascular procedure which I think was a distal popliteal bypass 05/21/17; switched to either for a blue last week. Patient arrived in clinic with the wound not measuring any better but the surface looking some better. Unfortunately she had some surface slough and when I went to remove this superiorly she had exposed bone. Previously she had had exposed bone in the inferior part of the wound however this is granulated over some weeks ago. Clearly this is a major step back for her. With some difficulty I was able to obtain a piece of the bone probing through the superior part of the wound for culture. We did not send pathology. It is likely that she is going to need imaging of this site but I did not order this today 05/28/17; bone culture I took last week showed MSSA.  She still has exposed bone however I could not get another piece for pathology. She had an MRI 4 months ago that did not show osteoarthritic at time. Wound rapidly deteriorated superiorly and I suspect this is underlying osteomyelitis. We have been using Hydrofera Blue 06/04/17 on evaluation today patient's wound appears to actually be doing a little bit better visually compared to last week's evaluation which is good news. Nonetheless she does have osteomyelitis of the toe which does not appear to be MRSA. No fevers, chills, nausea, or vomiting noted at this time. She is having no discomfort. 06/11/17; patient's wound looks a little healthier than I remember seeing it 2 weeks ago. There is no exposed bone and the surface of the wound appears well granulated. We've been using hydrofera blue. I note that she was started on doxycycline which was MSSA. Her appointment with Dr. Ola Spurr is on November 12 I believe. 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 Vallarie Mare of vascular surgery on 11/9. He offered right leg angiography possible intervention however the patient refused. I've discussed this with the patient's husband today she did not want any further procedures. Also noteworthy that Dr. Vallarie Mare stated that this wound had not healed in 3 years, I was not aware of this degree of chronicity in this area. She has underlying osteomyelitis by bone culture. Culture of this grew MSSA, she is allergic to penicillin and therefore not a candidate for beta lactams. She saw Dr. Ola Spurr of infectious disease this morning, he continued her on doxycycline for another month and apparently sent in a prescription. We have been using Hydrofera Blue. ABI's update by Dr. Bridgett Larsson right 0.62, left 0.89. Monophasic wave forms 06/25/17 on evaluation today patient appears to be doing well in regard to her right great toe wound. She is still taking the doxycycline as prescribed by Dr. Ola Spurr. The  Hydrofera Blue Dressing's to be doing as well as anything has for her up to this point and we are still waiting on an insurance authorization for the [place]. She is having no pain which is good news. No fevers, chills, nausea, or  vomiting noted at this time. 07/02/17; still taking doxycycline as prescribed by Dr. Ola Spurr. Hydrofera Blue continues. We have no palpable bone. Still have not had had approval of [employer] Electronic Signature(s) Signed: 07/27/2017 7:28:22 AM By: Linton Ham MD Entered By: Linton Ham on 07/23/2017 14:38:06 [name], [name] ([id number]) -------------------------------------------------------------------------------- Physical Exam Details Patient Name: [name], [name] Date of Service: 07/23/2017 12:30 PM Medical Record Number: [id number] Patient Account Number: [id number] Date of Birth/Sex: 12-28-1940 (76 y.o. Female) Treating RN: Cornell Barman Primary Care Provider: Beverlyn Roux Other Clinician: Referring Provider: Beverlyn Roux Treating Provider/Extender: Tito Dine in Treatment: 37 Notes Wound exam; the patient has a necrotic surface on the wound. Using a #3 curet this was debrided. Hemostasis with direct pressure. Unfortunately dimensions of this wound have not really improved although there is no exposed bone. Electronic Signature(s) Signed: 07/27/2017 7:28:22 AM By: Linton Ham MD Entered By: Linton Ham on 07/23/2017 14:39:10 [name], [name] ([id number]) -------------------------------------------------------------------------------- Physician Orders Details Patient Name: Pfister, Arlene V. Date of Service: 07/23/2017 12:30 PM Medical Record Number: [id number] Patient Account Number: [id number] Date of Birth/Sex: [birth year] (76 y.o. Female) Treating RN: Cornell Barman Primary Care Provider: Beverlyn Roux Other Clinician: Referring Provider: Beverlyn Roux Treating Provider/Extender: Tito Dine in Treatment: 95 Verbal /  Phone Orders: No Diagnosis Coding Wound Cleansing Wound #7 Right,Dorsal Metatarsal head first o Clean wound with Normal Saline. o Cleanse wound with mild soap and water Anesthetic (add to Medication List) Wound #7 Right,Dorsal Metatarsal head first o Topical Lidocaine 4% cream applied to wound bed prior to debridement (In Clinic Only). Primary Wound Dressing Wound #7 Right,Dorsal Metatarsal head first o Other: - Sorbact Secondary Dressing Wound #7 Right,Dorsal Metatarsal head first o ABD pad o Conform/Kerlix Dressing Change Frequency Wound #7 Right,Dorsal Metatarsal head first o Change Dressing Monday, Wednesday, Friday Edema Control Wound #7 Right,Dorsal Metatarsal head first o Elevate legs to the level of the heart and pump ankles as often as possible Additional Orders / Instructions Wound #7 Right,Dorsal Metatarsal head first o Increase protein intake. o Activity as tolerated Home Health Wound #7 Right,Dorsal Metatarsal head first o Continue Home Health Visits - [facility] weekly, [facility] o Home Health Nurse may visit PRN to address patientos wound care needs. o FACE TO FACE ENCOUNTER: MEDICARE and MEDICAID PATIENTS: I certify that this patient is under my care and that I had a face-to-face encounter that meets the physician face-to-face encounter requirements with this patient on this date. The encounter with the patient was in whole or in part for the following MEDICAL CONDITION: (primary reason for [place]) MEDICAL NECESSITY: I certify, that based on my findings, NURSING services are a medically necessary home health service. HOME BOUND STATUS: I certify that my clinical findings support that this patient is homebound (i.e., Due to illness or injury, pt requires aid of supportive devices such as crutches, cane, wheelchairs, walkers, the use of special transportation or the Husby, Adalia V. ([id number]) assistance of  another person to leave their place of residence. There is a normal inability to leave the home and doing so requires considerable and taxing effort. Other absences are for medical reasons / religious services and are infrequent or of short duration when for other reasons). o If current dressing causes regression in wound condition, may D/C ordered dressing product/s and apply Normal Saline Moist Dressing daily until next [place] / Other MD appointment. Miner of regression in wound condition at [phone].   o Please direct any NON-WOUND related issues/requests for orders to patient's Primary Care Physician Medications-please add to medication list. Wound #7 Right,Dorsal Metatarsal head first o P.O. Antibiotics - continue antibiotics as prescribed o Other: - Regranex Authorization Patient Medications Allergies: penicillin, sulfamethizole, iodine, Shellfish Containing Products, eggshell membrane Notifications Medication Indication Start End lidocaine DOSE topical 4 % cream - cream topical Electronic Signature(s) Signed: 07/24/2017 9:07:13 AM By: Gretta Cool, BSN, RN, CWS, Kim RN, BSN Signed: 07/27/2017 7:28:22 AM By: Linton Ham MD Entered By: Gretta Cool, BSN, RN, CWS, Kim on 07/23/2017 15:03:29 MIRA, BALON ([id number]) -------------------------------------------------------------------------------- Prescription 07/23/2017 Patient Name: [name] Provider: Ricard Dillon MD Date of Birth: Mar 22, 1941 NPI#: [id number] Sex: F DEA#: [id number] Phone #: [phone] License #: [id number] Patient Address: [facility] [place], [place] [address] [address] [place], [place] [address] [phone] Allergies penicillin sulfamethizole iodine Shellfish Containing Products eggshell membrane Medication Medication: Route: Strength:  Form: lidocaine 4 % topical cream topical 4% cream Class: TOPICAL LOCAL ANESTHETICS Dose: Frequency / Time: Indication: cream topical Number of Refills: Number of Units: 0 Generic Substitution: Start Date: End Date: One Time Use: Substitution Permitted No Note to Pharmacy: Signature(s): Date(s): CRISTY, COLMENARES ([id number]) Electronic Signature(s) Signed: 07/24/2017 9:07:13 AM By: Gretta Cool, BSN, RN, CWS, Kim RN, BSN Signed: 07/27/2017 7:28:22 AM By: Linton Ham MD Entered By: Gretta Cool, BSN, RN, CWS, Kim on 07/23/2017 15:03:30 Pita, [name] ([id number]) --------------------------------------------------------------------------------  Problem List Details Patient Name: Mccook, Kyler V. Date of Service: 07/23/2017 12:30 PM Medical Record Number: [id number] Patient Account Number: [id number] Date of Birth/Sex: Apr 15, 1941 (76 y.o. 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 ([id number]) -------------------------------------------------------------------------------- Progress Note Details Patient Name: Kulish, Kimbrely V. Date of Service: 07/23/2017 12:30 PM Medical Record Number: [id number] Patient Account Number: [id number] Date of Birth/Sex: 07/10/1941 (76 y.o. Female) Treating RN: Cornell Barman Primary Care Provider: Beverlyn Roux Other Clinician: Referring Provider: Beverlyn Roux Treating Provider/Extender: Tito Dine in Treatment: 37 Subjective History of Present Illness (HPI) 08/13/16: This is a 76 year old woman who came predominantly for review of 3 cm in diameter circular wound to the left anterior lateral leg. She was in the ER on 08/01/16 I reviewed their notes. There was apparently pus coming out of the wound at that time and the patient arrived requesting debridement which they don't do in the emergency room. Nevertheless I can't see that they did any x-rays. There were no cultures done. She is a type II diabetic and I a note after the patient was in the clinic  that she had a bypass graft from the popliteal to the tibial on the right on 02/28/16. She also had a right greater saphenous vein harvest on the same date for arterial bypass. She is going to have vascular studies including ABIs T ABIs on the right on 08/28/16. The patient's surgery was on 02/28/16 by Dr. Vallarie Mare she had a right below the knee popliteal artery to peroneal artery bypass with reverse greater saphenous vein and an endarterectomy of the mid segment peroneal artery. Postoperatively she had a strong mild monophasic peroneal signal with a pink foot. It would appear that the patient is had some nonhealing in the surgical saphenous vein harvest site on the left leg. Surprisingly looking through [facility] I cannot see much information about this at all. Dr. Lucious Groves notes from 05/29/16 show that the patient's wounds  "are not healed" the right first metatarsal wound healed but then opened back up. 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 At that point the patient had a wound VAC to her right calf with regards to the wounds on her dorsal right toe would appear that these are felt to be arterial wounds. She has had surgery on the metatarsal phalangeal in 2015 I Dr. Doran Durand secondary to a right metatarsal phalangeal joint fracture. She is apparently had discoloration around this area since then. 08/28/16; patient arrives with her wounds in much the same condition. The linear vein harvest site and the circular wound below it which I think was a blister. She also has to probing holes in her right great toe and a necrotic eschar on the right second toe. Because of these being arterial wounds I reduced her compression from 3-2 layers this seems to of done satisfactorily she has not had any problems. I cannot see that she is actually had an x-ray ====== 11/06/16 the patient comes in for evaluation of her right lower extremity ulcers. She was here in January 2018 for 2 visits subsequently ended up in the hospital with pneumonia and then to rehabilitation. She has now been discharged from rehabilitation and is home. She has multiple ulcerations to her right lower extremity including the foot and toes. She does have home health in place and they have been placing alginate to the ulcers. She is followed by Dr. Bridgett Larsson of vascular medicine. 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 She recently saw him on 3/23. In office ABIs were: Right 0.48 with monophasic flow to the DP, PT, peroneal Left 0.63 with monophasic flow to the DP and PT Her arterial studies indicated a patent right below knee popliteal to peroneal bypass She had an MRI in February 2008 that  was negative frosty myelitis but this showed general soft tissue edema in the right foot and lower extremity concerning for cellulitis She is a diabetic, managed with insulin. Her hemoglobin A1c in December 2017 was 8.4 which is a trend up from previous levels. She had blood work in February 2018 which revealed an albumin of 2.6 this appears to be relatively acute as an albumin in November 2017 was 3.7 11/13/16; this is a patient I have not seen since February who is readmitted to our clinic last week. 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 Bridgett Larsson. I have reviewed Dr.  Chen's notes from March/23/18. Doppler ABI on that date showed an ABI on the right of 0.48 and on the left of 0.63. Dorsalis pedis waveforms were monophasic bilaterally. There was no waveforms detected at the posterior tibial on the right, monophasic on the left. Dr. Lianne Moris comments were that this patient would have follow-up vascular studies in 3 months including ABIs and right lower extremity arterial duplex. She had an MRI in February that was negative for osteomyelitis but showed generalized edema in [name], [name] ([id number]) the foot. Last albumin I see was in January at 3.4 we have been using Santyl to the 4 wounds on the right leg the patient is noted today to have widespread edema well up towards her groin this is pitting 2-3+. 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 Normal ejection fraction. 11/20/16; patient has a follow-up appointment with Dr. Bridgett Larsson on April 23. She is still complaining of a lot of pain in the right foot and right leg. It is not clear to me that this is at all positional however I think it is clear claudication with minimal activity perhaps at rest. At our suggestion she is return to her primary physician's office tomorrow with regards to her pitting lower extremity bilateral  edema that I reviewed in detail last week 11/27/16; the follow-up with Dr. Bridgett Larsson was actually on May 23 on April 23 as I stated in my note last week. N/A case all of her wounds seems somewhat smaller. The 2 on the right leg are definitely smaller. The areas on the dorsal right first toe, right third toe and the lateral part of the right fifth metatarsal head all looks smaller but have tightly adherent surfaces. We have been using Santyl 12/04/16; follow-up with Dr. Bridgett Larsson on May 23. 2 small open wounds on the right leg continue to get smaller. The area on the right third total lateral aspect of the right fifth toe also look better. The remaining area on the dorsal first toe still has some depth to it. We have been using Santyl to the toes and collagen on the right 12/11/16; according to patient's husband the follow-up with Dr. Bridgett Larsson is not until July. All of the wounds on the right leg are measuring smaller. We have been using some combination of Prisma and Santyl although I think we can go to straight Prisma today. There may have been some confusion with home health about the primary dressing orders here. 12/25/16; the patient has had some healing this week. The area on her right lateral fifth metatarsal head, right third toe are both healed lower right leg is healed. In the vein harvest site superiorly she has one superficial open area. On the dorsal aspect of her right great toe/MTP joint the wound is now divided into 2 however the proximal area is deep and there is palpable bone 01/01/17; still an open area in the middle of her original right surgical scar. The area on the right third toe and right lateral fifth metatarsal head remained closed. Problematic area on the dorsal aspect of the toe. Previous surgery in this area line 01/08/17 small open area on the original scar on her upper anterior leg although this is closing. X-ray I did of the right first toe did not show underlying bony abnormality. Still  this area on the dorsal first toe probes to bone. We have been using Prisma 01/15/17; small open area in the original scar in the upper  anterior leg is almost fully closed. She has 2 open areas over the dorsal aspect of the right first toe that probes to bone. Used and a form starting last week. Vigorous bone scraping that I did last week showed few methicillin sensitive staph aureus. She is allergic to penicillin and sulfa drugs. I'm going to give her 2 weeks of doxycycline. She will need an MRI with contrast. 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 She continues on doxycycline for a bone scraping that showed methicillin sensitive staph aureus [allergic to penicillin and sulfa]. We have been using Endo form to the wound. 01/29/17; surprisingly her MRI did not show osteomyelitis. She continues on doxycycline for a bone scraping that showed methicillin sensitive staph aureus with allergies to penicillin and sulfa. We have been using Endo form to the wound. Unfortunately I cannot get a surface on this visit looks like it is able to support a healing state. Proximally there is still exposed bone. There is no overt soft tissue tenderness. Her MRI did show a previous fracture was surgery to this area but no hardware 02/12/17 on evaluation today patient appears to be doing well in regard to her lower extremity wound. She does have some mild discomfort but this is minimal. There's no evidence of infection. Her left great toe nail has somewhat been lifted up and there is a little bit of slight bleeding underneath but this is still firmly attached. 02/19/17; she ran out of doxycycline 2 days ago. Nevertheless I would like to continue this for 2 weeks to make a full 6 weeks of therapy as the bone scraping that I did from the open area on the dorsal right first toe showed MRSA. This should complete antibiotic therapy. 02/26/17; the  patient is completing her 6 weeks of oral doxycycline. Deep wound on the dorsal right toe. This still has some exposed bone proximally. She does have a reasonably granulated surface albeit thin over bone. I've been using Endo form have made application for an amniotic skin sub 03/05/17; the patient has completed 6 weeks of doxycycline. This is a deep wound on the dorsal right toe which has exposed bone proximally. She has granulation over most of this wound albeit a thin layer. I've been using Endo form. Still do not have approval for [employer] 03/13/17 on evaluation today patient's right foot wound appears to be doing better measurement wise compared to her last evaluation. She has been tolerating the dressing change without complication and does have a appointment with the dietary that has been made as far as referral is concerned there just waiting for her and transportation to contact them back for an actual date. Nonetheless patient has been having less pain at this point. No fevers, chills, nausea, or vomiting noted at this time. 03/26/17; linear wound over the dorsal aspect of her right great toe. At one point this had exposed bone on the most superior aspect however I am pleased to see today that this appears to have a surface of granulation. Apparently product was applied for but denied by [facility]. We'll need to see what that was. The patient has known PAD status post revascularization. MRI did not show osteomyelitis which was done in June 04/02/17; continued improvement using Endoform. She was approved for [employer] but hasn't over $409 co-pay per application, [name], [name] ([id number]) this is beyond her means 04/09/17; continues on Endoform. 04/16/17; appears  to be doing nicely continuing on Endoform 04/22/17; patient did not have her dressing changed all week because of the weather. Using Endoform. Base of the wound looks healthy elbow there appears to events surrounding maceration perhaps because  of the drainage was not changing the wound dressing 04/30/17 wound today continues to close and except for a small divot on the superior part of the wound. This area did not probe the bone but I found this a little concerning as this was the area with exposed bone before we be able to get this to granulate forward. As I remember things she is not a candidate for skin substitutes secondary to an outlandish co-pay. I asked her husband to look into the out of pocket max for medications if he has 1 [Regranex] or totally for skin substitutes 05/07/17; the patient arrives today with a wound roughly the same size although under careful inspection under the light there appears to be some epithelialization. Her intake nurse noted that the Endoform seemed to be placed over the wound rather than in the deeper divots they could really benefit from the Endoform. At this point I have no plans to change the Endoform as at one point this was at least 33% exposed bone and the rest of the wound very close to the underlying phalanx 05/14/17; no major change in the size or appearance of the wound. We have been using Endoform for a prolonged period of time with reasonably good improvement in the epithelialization i.e. no exposed bone especially proximally. Unless we've not made a lot of changes in the last several weeks. She does not complain of pain. She was revascularized early this year or perhaps late last year by Dr. Bridgett Larsson and I wonder if he will need to have a look at her, I will need to review the actual vascular procedure which I think was a distal popliteal bypass 05/21/17; switched to either for a blue last week. Patient arrived in clinic with the wound not measuring any better but the surface looking some better. Unfortunately she had some surface slough and when I went to remove this superiorly she had exposed bone. Previously she had had exposed bone in the inferior part of the wound however this is granulated  over some weeks ago. Clearly this is a major step back for her. With some difficulty I was able to obtain a piece of the bone probing through the superior part of the wound for culture. We did not send pathology. It is likely that she is going to need imaging of this site but I did not order this today 05/28/17; bone culture I took last week showed MSSA. She still has exposed bone however I could not get another piece for pathology. She had an MRI 4 months ago that did not show osteoarthritic at time. Wound rapidly deteriorated superiorly and I suspect this is underlying osteomyelitis. We have been using Hydrofera Blue 06/04/17 on evaluation today patient's wound appears to actually be doing a little bit better visually compared to last week's evaluation which is good news. Nonetheless she does have osteomyelitis of the toe which does not appear to be MRSA. No fevers, chills, nausea, or vomiting noted at this time. She is having no discomfort. 06/11/17; patient's wound looks a little healthier than I remember seeing it 2 weeks ago. There is no exposed bone and the surface of the wound appears well granulated. We've been using hydrofera blue. I note that she was started on doxycycline which  was MSSA. Her appointment with Dr. Ola Spurr is on November 12 I believe. 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 Vallarie Mare of vascular surgery on 11/9. He offered right leg angiography possible intervention however the patient refused. I've discussed this with the patient's husband today she did not want any further procedures. Also noteworthy that Dr. Vallarie Mare stated that this wound had not healed in 3 years, I was not aware of this degree of chronicity in this area. She has underlying osteomyelitis by bone culture. Culture of this grew MSSA, she is allergic to penicillin and therefore not a candidate for beta lactams. She saw Dr. Ola Spurr of infectious disease this morning, he continued  her on doxycycline for another month and apparently sent in a prescription. We have been using Hydrofera Blue. ABI's update by Dr. Bridgett Larsson right 0.62, left 0.89. Monophasic wave forms 06/25/17 on evaluation today patient appears to be doing well in regard to her right great toe wound. She is still taking the doxycycline as prescribed by Dr. Ola Spurr. The Hydrofera Blue Dressing's to be doing as well as anything has for her up to this point and we are still waiting on an insurance authorization for the [place]. She is having no pain which is good news. No fevers, chills, nausea, or vomiting noted at this time. 07/02/17; still taking doxycycline as prescribed by Dr. Ola Spurr. Hydrofera Blue continues. We have no palpable bone. Still have not had had approval of Apligraf Nest, Willisha V. ([id number]) Objective Constitutional Vitals Time Taken: 12:46 PM, Height: 64 in, Weight: 200 lbs, BMI: 34.3, Temperature: 97.7 F, Pulse: 68 bpm, Respiratory Rate: 16 breaths/min, Blood Pressure: 156/70 mmHg. Integumentary (Hair, Skin) Wound #7 status is Open. Original cause of wound was Gradually Appeared. The wound is located on the Right,Dorsal Metatarsal head first. The wound measures 1.9cm length x 0.6cm width x 0.2cm depth; 0.895cm^2 area and 0.179cm^3 volume. There is Fat Layer (Subcutaneous Tissue) Exposed exposed. There is no tunneling or undermining noted. There is a medium amount of serous drainage noted. The wound margin is flat and intact. There is medium (34-66%) red granulation within the wound bed. There is a medium (34-66%) amount of necrotic tissue within the wound bed including Adherent Slough. The periwound skin appearance exhibited: Induration, Hemosiderin Staining. The periwound skin appearance did not exhibit: Callus, Crepitus, Excoriation, Rash, Scarring, Dry/Scaly, Maceration, Atrophie Blanche, Cyanosis, Ecchymosis, Mottled, Pallor, Rubor, Erythema. The periwound has tenderness on  palpation. 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 There was a Skin/Subcutaneous Tissue Debridement ([id number]) debridement with total area of 1.14 sq cm performed by Ricard Dillon, MD. with the following instrument(s): Curette to remove Viable and Non-Viable tissue/material including Fibrin/Slough and Subcutaneous after achieving pain control using Other (lidocaine 4%). A time out was conducted at  13:00, prior to the start of the procedure. A Minimum amount of bleeding was controlled with Pressure. The procedure was tolerated well with a pain level of 1 throughout and a pain level of 3 following the procedure. Post Debridement Measurements: 1.9cm length x 0.6cm width x 0.2cm depth; 0.179cm^3 volume. Character of Wound/Ulcer Post Debridement requires further debridement. Severity of Tissue Post Debridement is: Fat layer exposed. Post procedure Diagnosis Wound #7: Same as Pre-Procedure [name], Maribelle V. ([id number]) Plan Wound Cleansing: Wound #7 Right,Dorsal Metatarsal head first: Clean wound with Normal Saline. Cleanse wound with mild soap and water Anesthetic (add to Medication  List): Wound #7 Right,Dorsal Metatarsal head first: Topical Lidocaine 4% cream applied to wound bed prior to debridement (In Clinic Only). Primary Wound Dressing: Wound #7 Right,Dorsal Metatarsal head first: Other: - Sorbact Secondary Dressing: Wound #7 Right,Dorsal Metatarsal head first: ABD pad Conform/Kerlix Dressing Change Frequency: Wound #7 Right,Dorsal Metatarsal head first: Change Dressing Monday, Wednesday, Friday Edema Control: Wound #7 Right,Dorsal Metatarsal head first: Elevate legs to the level of the heart and pump ankles as often as possible Additional Orders / Instructions: Wound #7 Right,Dorsal Metatarsal head first: Increase protein intake. Activity as tolerated Home Health: Wound #7 Right,Dorsal Metatarsal head first: [facility] twice weekly, [facility] Home Health Nurse may visit PR[facility] to address patient s wound care needs. FACE TO FACE ENCOUNTER: MEDICARE and MEDICAID PATIENTS: I certify that this patient is under my care and that I had a face-to-face encounter that meets the physician face-to-face encounter requirements with this patient on this date. The encounter with the patient was in whole or in part for the following MEDICAL CONDITION: (primary reason for [place]) MEDICAL NECESSITY: I certify, that based on my findings, NURSING services are a medically necessary home health service. HOME BOUND STATUS: I certify that my clinical findings support that this patient is homebound (i.e., Due to illness or injury, pt requires aid of supportive devices such as crutches, cane, wheelchairs, walkers, the use of special transportation or the assistance of another person to leave their place of residence. There is a normal inability to leave the home and doing so requires considerable and taxing effort. Other absences are for medical reasons / religious services and are infrequent or of short duration when for other  reasons). If current dressing causes regression in wound condition, may D/C ordered dressing product/s and apply Normal Saline Moist Dressing daily until next [place] / Other MD appointment. [place] of regression in wound condition at [phone]. Please direct any NON-WOUND related issues/requests for orders to patient's Primary Care Physician Medications-please add to medication list.: Wound #7 Right,Dorsal Metatarsal head first: P.O. Antibiotics - continue antibiotics as prescribed Other: - Regranex Authorization The following medication(s) was prescribed: lidocaine topical 4 % cream cream topical was prescribed at facility at 18:00 until 18:10 [name], [name] ([id number]) #1 we change the primary dressing to sorbact #2 we'll try to run rigor and asked her insurance #3 Apligraf was frankly denied by her insurance as "not medically necessary #4 she is allergic to iodine and therefore not a candidate for any of our iodine base dressings Electronic Signature(s) Signed: 07/25/2017 11:07:33 AM By: Gretta Cool, BSN, RN, CWS, Kim RN, BSN Signed: 07/27/2017 7:28:22 AM By: Linton Ham MD Entered By: Gretta Cool, BSN, RN, CWS, Kim on 07/25/2017 11:07:33 Persico, [name] ([id number]) -------------------------------------------------------------------------------- [place] Details Patient Name: Gutter, Keren V. Date of Service: 07/23/2017 Medical Record Number: [id number] Patient Account  Number: [id number] Date of Birth/Sex: 09/14/1940 (76 y.o. 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 with diabetic peripheral angiopathy with gangrene I70.232 Atherosclerosis 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 of calf I70.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, right ankle and foot Facility Procedures CPT4 Code Description: 59163846 11042 - DEB SUBQ TISSUE 20 SQ CM/< ICD-10 Diagnosis Description E11.52 Type 2 diabetes mellitus with diabetic peripheral angiopathy with L97.519 Non-pressure chronic ulcer of other part of right foot with unspe Modifier: gangrene cified severit Quantity: 1 y Physician Procedures CPT4 Code Description: 6599357 11042 - WC PHYS SUBQ TISS 20 SQ CM ICD-10 Diagnosis Description E11.52 Type 2 diabetes mellitus with diabetic peripheral angiopathy with L97.519 Non-pressure chronic ulcer of other part of right foot with unspec Modifier: gangrene ified severit Quantity: 1 y Engineer, maintenance) Signed: 07/27/2017 7:28:22 AM By: Linton Ham MD Entered By: Linton Ham on 07/23/2017 14:41:32

## 2017-07-27 NOTE — Progress Notes (Signed)
KHALEELAH, YOWELL ([id number]) Visit Report for 07/23/2017 Arrival Information Details Patient Name: [name], [name] Date of Service: 07/23/2017 12:30 PM Medical Record Number: [id number] Patient Account Number: [id number] Date of Birth/Sex: 1940-09-11 (76 y.o. Female) Treating White: [name] Primary Care [name]: [name] Other Clinician: Referring [name]: [name] Treating [name]/Extender: [name] in Treatment: 68 Visit Information History Since Last Visit Added or deleted any medications: No Patient Arrived: Wheel Chair Any new allergies or adverse reactions: No Arrival Time: 12:45 Had a fall or experienced change in No activities of daily living that may affect Accompanied By: husband risk of falls: Transfer Assistance: Manual Signs or symptoms of abuse/neglect since last No Patient Identification Verified: Yes visito Secondary Verification Process Completed: Yes Hospitalized since last visit: No Patient Requires Transmission-Based No Has Dressing in Place as Prescribed: Yes Precautions: Has Footwear/Offloading in Place as Yes Patient Has Alerts: No Prescribed: Left: Surgical Shoe with Pressure Relief Insole Right: Surgical Shoe with Pressure Relief Insole Pain Present Now: No Electronic Signature(s) Signed: 07/24/2017 9:07:13 AM By: [name], BSN, White, CWS, [name], BSN Entered By: [name], BSN, White, CWS, [name] on 07/23/2017 12:46:00 [name], [name] ([id number]) -------------------------------------------------------------------------------- Encounter Discharge Information Details Patient Name: [name], [name] Date of Service: 07/23/2017 12:30 PM Medical Record Number: [id number] Patient Account Number: [id number] Date of Birth/Sex: [birth year] (76 y.o. Female) Treating White: [name] Primary Care [name]: [name] Other Clinician: Referring Zion Ta: [name] Treating [name]/Extender: [name] in Treatment: 35 Encounter  Discharge Information Items Discharge Pain Level: 0 Discharge Condition: Stable Ambulatory Status: Wheelchair Discharge Destination: Home Private Transportation: Auto Accompanied By: husband Schedule Follow-up Appointment: Yes Medication Reconciliation completed and provided Yes to Patient/Care [name]: Clinical Summary of Care: Electronic Signature(s) Signed: 07/24/2017 9:07:13 AM By: [name], BSN, White, CWS, [name], BSN Entered By: [name], BSN, White, CWS, [name] on 07/23/2017 13:20:53 [name], [name] ([id number]) -------------------------------------------------------------------------------- Lower Extremity Assessment Details Patient Name: [name], [name] Date of Service: 07/23/2017 12:30 PM Medical Record Number: [id number] Patient Account Number: [id number] Date of Birth/Sex: 24-Aug-1940 (76 y.o. Female) Treating White: [name] Primary Care [name]: [name] Other Clinician: Referring [name]: [name] Treating [name]/Extender: [name] in Treatment: 37 Vascular Assessment Pulses: Dorsalis Pedis Palpable: [Right:No] Doppler Audible: [Right:Yes] Posterior Tibial Palpable: [Right:No] Doppler Audible: [Right:Inaudible] Extremity colors, hair growth, and conditions: Extremity Color: [Right:Hyperpigmented] Hair Growth on Extremity: [Right:No] Temperature of Extremity: [Right:Warm] Capillary Refill: [Right:> 3 seconds] Toe Nail Assessment Left: Right: Thick: Yes Discolored: Yes Deformed: Yes Improper Length and Hygiene: Yes Electronic Signature(s) Signed: 07/24/2017 9:07:13 AM By: [name], BSN, White, CWS, [name], BSN Entered By: [name], BSN, White, CWS, [name] on 07/23/2017 12:54:54 [name], [name] ([id number]) -------------------------------------------------------------------------------- Multi Wound Chart Details Patient Name: [name], [name] Date of Service: 07/23/2017 12:30 PM Medical Record Number: [id number] Patient Account Number: [id number] Date of  Birth/Sex: [birth year] (76 y.o. Female) Treating White: [name] Primary Care Annarose Ouellet: [name] Other Clinician: Referring [name]: [name] Treating [name]/Extender: [name] in Treatment: 37 Vital Signs Height(in): 64 Pulse(bpm): 38 Weight(lbs): 200 Blood Pressure(mmHg): 156/70 Body Mass Index(BMI): 34 Temperature(F): 97.7 Respiratory Rate 16 (breaths/min): Photos: [N/A:N/A] Wound Location: Right Metatarsal head first - N/A N/A Dorsal Wounding Event: Gradually Appeared N/A N/A Primary Etiology: Diabetic Wound/Ulcer of the N/A N/A Lower Extremity Comorbid History: Cataracts, Chronic sinus N/A N/A problems/congestion, Congestive Heart Failure, Hypertension, Type II Diabetes Date Acquired: 08/06/2013 N/A N/A Weeks of Treatment: 37 N/A N/A Wound Status: Open  N/A N/A Measurements L x W x D 1.9x0.6x0.2 N/A N/A (cm) Area (cm) : 0.895 N/A N/A Volume (cm) : 0.179 N/A N/A % Reduction in Area: 24.00% N/A N/A % Reduction in Volume: 49.30% N/A N/A Classification: Grade 2 N/A N/A Exudate Amount: Medium N/A N/A Exudate Type: Serous N/A N/A Exudate Color: amber N/A N/A Wound Margin: Flat and Intact N/A N/A Granulation Amount: Medium (34-66%) N/A N/A Granulation Quality: Red N/A N/A Necrotic Amount: Medium (34-66%) N/A N/A Exposed Structures: Fat Layer (Subcutaneous N/A N/A Tissue) Exposed: Yes Fascia: No Tendon: No Muscle: No [name], [name] ([id number]) Joint: No Bone: No Epithelialization: Small (1-33%) N/A N/A Debridement: Debridement ([id number]) N/A N/A Pre-procedure 13:00 N/A N/A Verification/Time Out Taken: Pain Control: Other N/A N/A Tissue Debrided: Fibrin/Slough, Subcutaneous N/A N/A Level: Skin/Subcutaneous Tissue N/A N/A Debridement Area (sq cm): 1.14 N/A N/A Instrument: Curette N/A N/A Bleeding: Minimum N/A N/A Hemostasis Achieved: Pressure N/A N/A Procedural Pain: 1 N/A N/A Post Procedural Pain: 3 N/A N/A Debridement Treatment  Procedure was tolerated well N/A N/A Response: Post Debridement 1.9x0.6x0.2 N/A N/A Measurements L x W x D (cm) Post Debridement Volume: 0.179 N/A N/A (cm) Periwound Skin Texture: Induration: Yes N/A N/A Excoriation: No Callus: No Crepitus: No Rash: No Scarring: No Periwound Skin Moisture: Maceration: No N/A N/A Dry/Scaly: No Periwound Skin Color: Hemosiderin Staining: Yes N/A N/A Atrophie Blanche: No Cyanosis: No Ecchymosis: No Erythema: No Mottled: No Pallor: No Rubor: No Tenderness on Palpation: Yes N/A N/A Wound Preparation: Ulcer Cleansing: N/A N/A Rinsed/Irrigated with Saline Topical Anesthetic Applied: Other: lidocaine 4% Procedures Performed: Debridement N/A N/A Treatment Notes Wound #7 (Right, Dorsal Metatarsal head first) 1. Cleansed with: Clean wound with Normal Saline 2. Anesthetic Topical Lidocaine 4% cream to wound bed prior to debridement 4. Dressing Applied: Other dressing (specify in notes) 5. Secondary Dressing Applied ABD Pad Varnadore, [place] ([id number]) 7. Secured with Paper tape Notes sorbact, heel foam and gauze wrap Electronic Signature(s) Signed: 07/27/2017 7:28:22 AM By: Linton Ham MD Entered By: Linton Ham on 07/23/2017 14:37:31 Herrod, Lorrain [name] ([id number]) -------------------------------------------------------------------------------- Multi-Disciplinary Care Plan Details Patient Name: [name], [name] Date of Service: 07/23/2017 12:30 PM Medical Record Number: [id number] Patient Account Number: [id number] Date of Birth/Sex: 1941/05/29 (76 y.o. Female) Treating White: [name] Primary Care Hiyab Nhem: [name] Other Clinician: Referring Jermaine Tholl: [name] Treating Ladene Allocca/Extender: [name] in Treatment: 37 Active Inactive ` Abuse / Safety / Falls / Self Care Management Nursing Diagnoses: Impaired physical mobility Potential for falls Goals: Patient will remain injury free Date Initiated:  11/06/2016 Target Resolution Date: 12/30/2016 Goal Status: Active Patient/caregiver will verbalize understanding of skin care regimen Date Initiated: 11/06/2016 Target Resolution Date: 12/30/2016 Goal Status: Active Interventions: Assess fall risk on admission and as needed Treatment Activities: Patient referred to home care : 11/06/2016 Notes: ` Nutrition Nursing Diagnoses: Potential for alteratiion in Nutrition/Potential for imbalanced nutrition Goals: Patient/caregiver verbalizes understanding of need to maintain therapeutic glucose control per primary care physician Date Initiated: 11/06/2016 Target Resolution Date: 12/30/2016 Goal Status: Active Interventions: Provide education on elevated blood sugars and impact on wound healing Notes: ` Orientation to the Wound Care Program Nursing Diagnoses: Knowledge deficit related to the wound healing center program Alcocer, TAKIRAH BINFORD ([id number]) Goals: Patient/caregiver will verbalize understanding of the [place] Program Date Initiated: 11/06/2016 Target Resolution Date: 12/30/2016 Goal Status: Active Interventions: Provide education on orientation to the wound center Notes: ` Venous Leg Ulcer Nursing Diagnoses: Actual venous Insuffiency (use after diagnosis is confirmed)  Knowledge deficit related to disease process and management Goals: Non-invasive venous studies are completed as ordered Date Initiated: 11/06/2016 Target Resolution Date: 12/30/2016 Goal Status: Active Patient/caregiver will verbalize understanding of disease process and disease management Date Initiated: 11/06/2016 Target Resolution Date: 12/30/2016 Goal Status: Active Interventions: Assess peripheral edema status every visit. Notes: ` Wound/Skin Impairment Nursing Diagnoses: Impaired tissue integrity Knowledge deficit related to smoking impact on wound healing Knowledge deficit related to ulceration/compromised skin integrity Goals: Ulcer/skin  breakdown will heal within 14 weeks Date Initiated: 11/06/2016 Target Resolution Date: 02/05/2017 Goal Status: Active Interventions: Assess ulceration(s) every visit Treatment Activities: Skin care regimen initiated : 11/06/2016 Notes: Electronic Signature(s) Signed: 07/24/2017 9:07:13 AM By: [name], BSN, White, CWS, [name], BSN [name], [name] ([id number]) Entered By: [name], BSN, White, CWS, [name] on 07/23/2017 12:55:00 [name], [name] ([id number]) -------------------------------------------------------------------------------- Pain Assessment Details Patient Name: [name], [name] Date of Service: 07/23/2017 12:30 PM Medical Record Number: [id number] Patient Account Number: [id number] Date of Birth/Sex: 09-Sep-1940 (76 y.o. Female) Treating White: [name] Primary Care Kendrew Paci: [name] Other Clinician: Referring Nussen Pullin: [name] Treating Lynnetta Tom/Extender: [name] in Treatment: 37 Active Problems Location of Pain Severity and Description of Pain Patient Has Paino No Site Locations With Dressing Change: No Pain Management and Medication Current Pain Management: Goals for Pain Management Topical or injectable lidocaine is offered to patient for acute pain when surgical debridement is performed. If needed, Patient is instructed to use over the counter pain medication for the following 24-48 hours after debridement. Wound care MDs do not prescribed pain medications. Patient has chronic pain or uncontrolled pain. Patient has been instructed to make an appointment with their Primary Care Physician for pain management. Electronic Signature(s) Signed: 07/24/2017 9:07:13 AM By: [name], BSN, White, CWS, [name], BSN Entered By: [name], BSN, White, CWS, [name] on 07/23/2017 12:46:22 [place], [name] ([id number]) -------------------------------------------------------------------------------- Patient/Caregiver Education Details Patient Name: [name], [name] Date of Service: 07/23/2017 12:30  PM Medical Record Number: [id number] Patient Account Number: [id number] Date of Birth/Gender: 01/04/1941 (76 y.o. Female) Treating White: [name] Primary Care Physician: [name] Other Clinician: Referring Physician: Beverlyn White Treating Physician/Extender: [name] in Treatment: 42 Education Assessment Education Provided To: Patient Education Topics Provided Venous: Wound/Skin Impairment: Handouts: Caring for Your Ulcer Methods: Demonstration, Explain/Verbal Responses: State content correctly Electronic Signature(s) Signed: 07/24/2017 9:07:13 AM By: [name], BSN, White, CWS, [name], BSN Entered By: [name], BSN, White, CWS, [name] on 07/23/2017 13:21:10 [name], [name] ([id number]) -------------------------------------------------------------------------------- Wound Assessment Details Patient Name: [name], [name] Date of Service: 07/23/2017 12:30 PM Medical Record Number: [id number] Patient Account Number: [id number] Date of Birth/Sex: 06-16-1941 (76 y.o. Female) Treating White: [name] Primary Care Danyael Alipio: [name] Other Clinician: Referring Chanice Brenton: [name] Treating Carline Dura/Extender: [name] in Treatment: 37 Wound Status Wound Number: 7 Primary Diabetic Wound/Ulcer of the Lower Extremity Etiology: Wound Location: Right Metatarsal head first - Dorsal Wound Open Wounding Event: Gradually Appeared Status: Date Acquired: 08/06/2013 Comorbid Cataracts, Chronic sinus problems/congestion, Weeks Of Treatment: 37 History: Congestive Heart Failure, Hypertension, Type II Clustered Wound: No Diabetes Photos Wound Measurements Length: (cm) 1.9 Width: (cm) 0.6 Depth: (cm) 0.2 Area: (cm) 0.895 Volume: (cm) 0.179 % Reduction in Area: 24% % Reduction in Volume: 49.3% Epithelialization: Small (1-33%) Tunneling: No Undermining: No Wound Description Classification: Grade 2 Wound Margin: Flat and Intact Exudate Amount: Medium Exudate Type:  Serous Exudate Color: amber Foul Odor After Cleansing: No Slough/Fibrino Yes Wound Bed Granulation Amount:  Medium (34-66%) Exposed Structure Granulation Quality: Red Fascia Exposed: No Necrotic Amount: Medium (34-66%) Fat Layer (Subcutaneous Tissue) Exposed: Yes Necrotic Quality: Adherent Slough Tendon Exposed: No Muscle Exposed: No Joint Exposed: No Bone Exposed: No Periwound Skin Texture Texture Color No Abnormalities Noted: No No Abnormalities Noted: No Callus: No Atrophie Blanche: No [name], [name] ([id number]) Crepitus: No Cyanosis: No Excoriation: No Ecchymosis: No Induration: Yes Erythema: No Rash: No Hemosiderin Staining: Yes Scarring: No Mottled: No Pallor: No Moisture Rubor: No No Abnormalities Noted: No Dry / Scaly: No Temperature / Pain Maceration: No Tenderness on Palpation: Yes Wound Preparation Ulcer Cleansing: Rinsed/Irrigated with Saline Topical Anesthetic Applied: Other: lidocaine 4%, Treatment Notes Wound #7 (Right, Dorsal Metatarsal head first) 1. Cleansed with: Clean wound with Normal Saline 2. Anesthetic Topical Lidocaine 4% cream to wound bed prior to debridement 4. Dressing Applied: Other dressing (specify in notes) 5. Secondary Dressing Applied ABD Pad 7. Secured with Paper tape Notes sorbact, heel foam and gauze wrap Electronic Signature(s) Signed: 07/24/2017 9:07:13 AM By: [name], BSN, White, CWS, [name], BSN Entered By: [name], BSN, White, CWS, [name] on 07/23/2017 12:54:10 [name], [name] ([id number]) -------------------------------------------------------------------------------- Vitals Details Patient Name: Frosch, Holli V. Date of Service: 07/23/2017 12:30 PM Medical Record Number: [id number] Patient Account Number: [id number] Date of Birth/Sex: [birth year] (76 y.o. Female) Treating White: [name] Primary Care Aryn Kops: [name] Other Clinician: Referring Melis Trochez: [name] Treating Joselin Crandell/Extender: [name] in Treatment: 37 Vital Signs Time Taken: 12:46 Temperature (F): 97.7 Height (in): 64 Pulse (bpm): 68 Weight (lbs): 200 Respiratory Rate (breaths/min): 16 Body Mass Index (BMI): 34.3 Blood Pressure (mmHg): 156/70 Reference Range: 80 - 120 mg / dl Electronic Signature(s) Signed: 07/24/2017 9:07:13 AM By: [name], BSN, White, CWS, [name], BSN Entered By: [name], BSN, White, CWS, [name] on 07/23/2017 12:47:01

## 2017-07-31 ENCOUNTER — Ambulatory Visit: Payer: Medicare HMO | Admitting: Surgery

## 2017-08-06 ENCOUNTER — Encounter: Payer: Medicare HMO | Attending: Internal Medicine | Admitting: Internal Medicine

## 2017-08-06 DIAGNOSIS — Z880 Allergy status to penicillin: Secondary | ICD-10-CM | POA: Insufficient documentation

## 2017-08-06 DIAGNOSIS — L97219 Non-pressure chronic ulcer of right calf with unspecified severity: Secondary | ICD-10-CM | POA: Diagnosis not present

## 2017-08-06 DIAGNOSIS — I70232 Atherosclerosis 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 of calf: Secondary | ICD-10-CM | POA: Insufficient documentation

## 2017-08-06 DIAGNOSIS — E11622 Type 2 diabetes mellitus with other skin ulcer: Secondary | ICD-10-CM | POA: Insufficient documentation

## 2017-08-06 DIAGNOSIS — I70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 of foot: Secondary | ICD-10-CM | POA: Diagnosis not present

## 2017-08-06 DIAGNOSIS — L97519 Non-pressure chronic ulcer of other part of right foot with unspecified severity: Secondary | ICD-10-CM | POA: Diagnosis not present

## 2017-08-06 DIAGNOSIS — M86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, right ankle and foot: Secondary | ICD-10-CM | POA: Insufficient documentation

## 2017-08-06 DIAGNOSIS — E11621 Type 2 diabetes mellitus with foot ulcer: Secondary | ICD-10-CM | POA: Insufficient documentation

## 2017-08-06 DIAGNOSIS — E1152 Type 2 diabetes mellitus with diabetic peripheral angiopathy with gangrene: Secondary | ICD-10-CM | POA: Insufficient documentation

## 2017-08-08 ENCOUNTER — Ambulatory Visit: Payer: Medicare HMO | Admitting: Vascular Surgery

## 2017-08-08 ENCOUNTER — Encounter (HOSPITAL_COMMUNITY): Payer: Medicare HMO

## 2017-08-08 NOTE — Progress Notes (Signed)
MAKINLEE, AWWAD ([id number]) Visit Report for 08/06/2017 Arrival Information Details Patient Name: Coker, LIZZY HAMRE. Date of Service: 08/06/2017 11:00 AM Medical Record Number: [id number] Patient Account Number: [id number] Date of Birth/Sex: 08-26-1940 (77 y.o. Female) Treating RN: Cornell Barman Primary Care [place] Lohmeyer: Beverlyn Roux Other Clinician: Referring Nicolena Schurman: Beverlyn Roux Treating Venezia Sargeant/Extender: Tito Dine in Treatment: 17 Visit Information History Since Last Visit Added or deleted any medications: No Patient Arrived: Wheel Chair Any new allergies or adverse reactions: No Arrival Time: 11:16 Had a fall or experienced change in No activities of daily living that may affect Accompanied By: husband risk of falls: Transfer Assistance: Manual Signs or symptoms of abuse/neglect since last visito No Patient Identification Verified: Yes Hospitalized since last visit: No Secondary Verification Process Completed: Yes Has Dressing in Place as Prescribed: Yes Patient Requires Transmission-Based No Pain Present Now: No Precautions: Patient Has Alerts: No Electronic Signature(s) Signed: 08/07/2017 5:50:18 PM By: Gretta Cool, BSN, RN, CWS, Kim RN, BSN Entered By: Gretta Cool, BSN, RN, CWS, Kim on 08/06/2017 11:17:19 Mabie, [name] ([id number]) -------------------------------------------------------------------------------- Clinic Level of Care Assessment Details Patient Name: Savant, Vale V. Date of Service: 08/06/2017 11:00 AM Medical Record Number: [id number] Patient Account Number: [id number] Date of Birth/Sex: 1941-06-10 (77 y.o. Female) Treating RN: Cornell Barman Primary Care Cregg Jutte: Beverlyn Roux Other Clinician: Referring Hazelene Doten: Beverlyn Roux Treating Atharva Mirsky/Extender: Tito Dine in Treatment: 54 Clinic Level of Care Assessment Items TOOL 4 Quantity Score []  - Use when only an EandM is performed on FOLLOW-UP visit 0 ASSESSMENTS - Nursing Assessment / Reassessment []  -  Reassessment of Co-morbidities (includes updates in patient status) 0 X- 1 5 Reassessment of Adherence to Treatment Plan ASSESSMENTS - Wound and Skin Assessment / Reassessment X - Simple Wound Assessment / Reassessment - one wound 1 5 []  - 0 Complex Wound Assessment / Reassessment - multiple wounds []  - 0 Dermatologic / Skin Assessment (not related to wound area) ASSESSMENTS - Focused Assessment []  - Circumferential Edema Measurements - multi extremities 0 []  - 0 Nutritional Assessment / Counseling / Intervention []  - 0 Lower Extremity Assessment (monofilament, tuning fork, pulses) []  - 0 Peripheral Arterial Disease Assessment (using hand held doppler) ASSESSMENTS - Ostomy and/or Continence Assessment and Care []  - Incontinence Assessment and Management 0 []  - 0 Ostomy Care Assessment and Management (repouching, etc.) PROCESS - Coordination of Care X - Simple Patient / Family Education for ongoing care 1 15 []  - 0 Complex (extensive) Patient / Family Education for ongoing care X- 1 10 Staff obtains Programmer, systems, Records, Test Results / Process Orders []  - 0 Staff telephones HHA, Nursing Homes / Clarify orders / etc []  - 0 Routine Transfer to another Facility (non-emergent condition) []  - 0 Routine Hospital Admission (non-emergent condition) []  - 0 New Admissions / Biomedical engineer / Ordering NPWT, Apligraf, etc. []  - 0 Emergency Hospital Admission (emergent condition) X- 1 10 Simple Discharge Coordination Noori, Sadeen V. ([id number]) []  - 0 Complex (extensive) Discharge Coordination PROCESS - Special Needs []  - Pediatric / Minor Patient Management 0 []  - 0 Isolation Patient Management []  - 0 Hearing / Language / Visual special needs []  - 0 Assessment of Community assistance (transportation, D/C planning, etc.) []  - 0 Additional assistance / Altered mentation []  - 0 Support Surface(s) Assessment (bed, cushion, seat, etc.) INTERVENTIONS - Wound Cleansing /  Measurement X - Simple Wound Cleansing - one wound 1 5 []  - 0 Complex Wound Cleansing - multiple wounds X- 1 5 Wound Imaging (photographs -  any number of wounds) []  - 0 Wound Tracing (instead of photographs) X- 1 5 Simple Wound Measurement - one wound []  - 0 Complex Wound Measurement - multiple wounds INTERVENTIONS - Wound Dressings []  - Small Wound Dressing one or multiple wounds 0 X- 1 15 Medium Wound Dressing one or multiple wounds []  - 0 Large Wound Dressing one or multiple wounds []  - 0 Application of Medications - topical []  - 0 Application of Medications - injection INTERVENTIONS - Miscellaneous []  - External ear exam 0 []  - 0 Specimen Collection (cultures, biopsies, blood, body fluids, etc.) []  - 0 Specimen(s) / Culture(s) sent or taken to Lab for analysis []  - 0 Patient Transfer (multiple staff / Civil Service fast streamer / Similar devices) []  - 0 Simple Staple / Suture removal (25 or less) []  - 0 Complex Staple / Suture removal (26 or more) []  - 0 Hypo / Hyperglycemic Management (close monitor of Blood Glucose) []  - 0 Ankle / Brachial Index (ABI) - do not check if billed separately X- 1 5 Vital Signs Veldman, Jaden V. ([id number]) Has the patient been seen at the hospital within the last three years: Yes Total Score: 80 Level Of Care: New/Established - Level 3 Electronic Signature(s) Signed: 08/07/2017 5:50:18 PM By: Gretta Cool, BSN, RN, CWS, Kim RN, BSN Entered By: Gretta Cool, BSN, RN, CWS, Kim on 08/06/2017 11:43:36 Suliman, [name] ([id number]) -------------------------------------------------------------------------------- Encounter Discharge Information Details Patient Name: Yeoman, Lesia V. Date of Service: 08/06/2017 11:00 AM Medical Record Number: [id number] Patient Account Number: [id number] Date of Birth/Sex: Apr 13, 1941 (77 y.o. Female) Treating RN: Cornell Barman Primary Care Christophor Eick: Beverlyn Roux Other Clinician: Referring Navina Wohlers: Beverlyn Roux Treating Raney Koeppen/Extender:  Tito Dine in Treatment: 68 Encounter Discharge Information Items Discharge Pain Level: 0 Discharge Condition: Stable Ambulatory Status: Ambulatory Discharge Destination: Home Transportation: Private Auto Accompanied By: self Schedule Follow-up Appointment: Yes Medication Reconciliation completed and Yes provided to Patient/Care Harla Mensch: Provided on Clinical Summary of Care: 08/06/2017 Form Type Recipient Paper Patient [facility] Electronic Signature(s) Signed: 08/07/2017 5:50:18 PM By: Gretta Cool, BSN, RN, CWS, Kim RN, BSN Entered By: Gretta Cool, BSN, RN, CWS, Kim on 08/06/2017 11:44:19 Luck, [name] ([id number]) -------------------------------------------------------------------------------- Lower Extremity Assessment Details Patient Name: Ruttan, Kiannah V. Date of Service: 08/06/2017 11:00 AM Medical Record Number: [id number] Patient Account Number: [id number] Date of Birth/Sex: 08/24/1940 (77 y.o. Female) Treating RN: Cornell Barman Primary Care Demiyah Fischbach: Beverlyn Roux Other Clinician: Referring Garin Mata: Beverlyn Roux Treating Ellin Fitzgibbons/Extender: Tito Dine in Treatment: 39 Vascular Assessment Pulses: Dorsalis Pedis Palpable: [Right:No] Doppler Audible: [Right:Yes] Posterior Tibial Extremity colors, hair growth, and conditions: Extremity Color: [Right:Hyperpigmented] Hair Growth on Extremity: [Right:No] Temperature of Extremity: [Right:Warm] Capillary Refill: [Right:< 3 seconds] Toe Nail Assessment Left: Right: Thick: Yes Discolored: Yes Deformed: Yes Improper Length and Hygiene: No Electronic Signature(s) Signed: 08/07/2017 5:50:18 PM By: Gretta Cool, BSN, RN, CWS, Kim RN, BSN Entered By: Gretta Cool, BSN, RN, CWS, Kim on 08/06/2017 11:23:12 Lagrange, [name] ([id number]) -------------------------------------------------------------------------------- Multi Wound Chart Details Patient Name: Pippins, Rosselyn V. Date of Service: 08/06/2017 11:00 AM Medical Record Number:  [id number] Patient Account Number: [id number] Date of Birth/Sex: 1941/03/16 (77 y.o. Female) Treating RN: Cornell Barman Primary Care Arna Luis: Beverlyn Roux Other Clinician: Referring Eliya Bubar: Beverlyn Roux Treating Rudi Bunyard/Extender: Tito Dine in Treatment: 39 Vital Signs Height(in): 64 Pulse(bpm): 82 Weight(lbs): 200 Blood Pressure(mmHg): 169/52 Body Mass Index(BMI): 34 Temperature(F): 97.6 Respiratory Rate 16 (breaths/min): Photos: [N/A:N/A] Wound Location: Right Metatarsal head first - N/A N/A Dorsal Wounding Event: Gradually Appeared N/A N/A Primary Etiology:  Diabetic Wound/Ulcer of the N/A N/A Lower Extremity Comorbid History: Cataracts, Chronic sinus N/A N/A problems/congestion, Congestive Heart Failure, Hypertension, Type II Diabetes Date Acquired: 08/06/2013 N/A N/A Weeks of Treatment: 39 N/A N/A Wound Status: Open N/A N/A Measurements L x W x D 2x0.6x0.3 N/A N/A (cm) Area (cm) : 0.942 N/A N/A Volume (cm) : 0.283 N/A N/A % Reduction in Area: 20.00% N/A N/A % Reduction in Volume: 19.80% N/A N/A Classification: Grade 2 N/A N/A Exudate Amount: Medium N/A N/A Exudate Type: Serous N/A N/A Exudate Color: amber N/A N/A Wound Margin: Flat and Intact N/A N/A Granulation Amount: Medium (34-66%) N/A N/A Granulation Quality: Red N/A N/A Necrotic Amount: Medium (34-66%) N/A N/A Exposed Structures: Fat Layer (Subcutaneous N/A N/A Tissue) Exposed: Yes Fascia: No Tendon: No Muscle: No Divelbiss, Brynnan V. ([id number]) Joint: No Bone: No Epithelialization: Small (1-33%) N/A N/A Periwound Skin Texture: Induration: Yes N/A N/A Excoriation: No Callus: No Crepitus: No Rash: No Scarring: No Periwound Skin Moisture: Maceration: No N/A N/A Dry/Scaly: No Periwound Skin Color: Hemosiderin Staining: Yes N/A N/A Atrophie Blanche: No Cyanosis: No Ecchymosis: No Erythema: No Mottled: No Pallor: No Rubor: No Tenderness on Palpation: Yes N/A N/A Wound  Preparation: Ulcer Cleansing: N/A N/A Rinsed/Irrigated with Saline Topical Anesthetic Applied: Other: lidocaine 4% Treatment Notes Wound #7 (Right, Dorsal Metatarsal head first) 1. Cleansed with: Clean wound with Normal Saline 2. Anesthetic Topical Lidocaine 4% cream to wound bed prior to debridement Notes sorbact, heel foam and gauze wrap Electronic Signature(s) Signed: 08/06/2017 5:48:27 PM By: Linton Ham MD Entered By: Linton Ham on 08/06/2017 12:23:12 Mogle, [name] ([id number]) -------------------------------------------------------------------------------- [place] Details Patient Name: Raisch, Daryle V. Date of Service: 08/06/2017 11:00 AM Medical Record Number: [id number] Patient Account Number: [id number] Date of Birth/Sex: 1940-11-18 (77 y.o. Female) Treating RN: Cornell Barman Primary Care Vaniyah Lansky: Beverlyn Roux Other Clinician: Referring Denzel Etienne: Beverlyn Roux Treating Bari Leib/Extender: Tito Dine in Treatment: 73 Active Inactive ` Abuse / Safety / Falls / Self Care Management Nursing Diagnoses: Impaired physical mobility Potential for falls Goals: Patient will remain injury free Date Initiated: 11/06/2016 Target Resolution Date: 12/30/2016 Goal Status: Active Patient/caregiver will verbalize understanding of skin care regimen Date Initiated: 11/06/2016 Target Resolution Date: 12/30/2016 Goal Status: Active Interventions: Assess fall risk on admission and as needed Treatment Activities: Patient referred to home care : 11/06/2016 Notes: ` Nutrition Nursing Diagnoses: Potential for alteratiion in Nutrition/Potential for imbalanced nutrition Goals: Patient/caregiver verbalizes understanding of need to maintain therapeutic glucose control per primary care physician Date Initiated: 11/06/2016 Target Resolution Date: 12/30/2016 Goal Status: Active Interventions: Provide education on elevated blood sugars and impact on wound  healing Notes: ` Orientation to the Wound Care Program Nursing Diagnoses: Knowledge deficit related to the wound healing center program FLOSSIE, WEXLER ([id number]) Goals: Patient/caregiver will verbalize understanding of the [place] Date Initiated: 11/06/2016 Target Resolution Date: 12/30/2016 Goal Status: Active Interventions: Provide education on orientation to the wound center Notes: ` Venous Leg Ulcer Nursing Diagnoses: Actual venous Insuffiency (use after diagnosis is confirmed) Knowledge deficit related to disease process and management Goals: Non-invasive venous studies are completed as ordered Date Initiated: 11/06/2016 Target Resolution Date: 12/30/2016 Goal Status: Active Patient/caregiver will verbalize understanding of disease process and disease management Date Initiated: 11/06/2016 Target Resolution Date: 12/30/2016 Goal Status: Active Interventions: Assess peripheral edema status every visit. Notes: ` Wound/Skin Impairment Nursing Diagnoses: Impaired tissue integrity Knowledge deficit related to smoking impact on wound healing Knowledge deficit related to ulceration/compromised skin integrity  Goals: Ulcer/skin breakdown will heal within 14 weeks Date Initiated: 11/06/2016 Target Resolution Date: 02/05/2017 Goal Status: Active Interventions: Assess ulceration(s) every visit Treatment Activities: Skin care regimen initiated : 11/06/2016 Notes: Electronic Signature(s) Signed: 08/07/2017 5:50:18 PM By: Gretta Cool, BSN, RN, CWS, Kim RN, BSN Broker, Yaeli V. ([id number]) Entered By: Gretta Cool, BSN, RN, CWS, Kim on 08/06/2017 11:23:21 Girtman, [name] ([id number]) -------------------------------------------------------------------------------- Pain Assessment Details Patient Name: Philbert, [place]field Date of Service: 08/06/2017 11:00 AM Medical Record Number: [id number] Patient Account Number: [id number] Date of Birth/Sex: 01/19/1941 (77 y.o. Female) Treating RN:  Cornell Barman Primary Care Cassadee Vanzandt: Beverlyn Roux Other Clinician: Referring Shadow Stiggers: Beverlyn Roux Treating Brownie Nehme/Extender: Tito Dine in Treatment: 39 Active Problems Location of Pain Severity and Description of Pain Patient Has Paino No Site Locations With Dressing Change: No Pain Management and Medication Current Pain Management: Goals for Pain Management Topical or injectable lidocaine is offered to patient for acute pain when surgical debridement is performed. If needed, Patient is instructed to use over the counter pain medication for the following 24-48 hours after debridement. Wound care MDs do not prescribed pain medications. Patient has chronic pain or uncontrolled pain. Patient has been instructed to make an appointment with their Primary Care Physician for pain management. Electronic Signature(s) Signed: 08/07/2017 5:50:18 PM By: Gretta Cool, BSN, RN, CWS, Kim RN, BSN Entered By: Gretta Cool, BSN, RN, CWS, Kim on 08/06/2017 11:17:30 Kaas, [name] ([id number]) -------------------------------------------------------------------------------- Patient/Caregiver Education Details Patient Name: Tomaselli, Amadi V. Date of Service: 08/06/2017 11:00 AM Medical Record Number: [id number] Patient Account Number: [id number] Date of Birth/Gender: 28-Oct-1940 (77 y.o. Female) Treating RN: Cornell Barman Primary Care Physician: Beverlyn Roux Other Clinician: Referring Physician: Beverlyn Roux Treating Physician/Extender: Tito Dine in Treatment: 82 Education Assessment Education Provided To: Patient and Caregiver Education Topics Provided Wound/Skin Impairment: Handouts: Caring for Your Ulcer Methods: Demonstration, Explain/Verbal Responses: State content correctly Electronic Signature(s) Signed: 08/07/2017 5:50:18 PM By: Gretta Cool, BSN, RN, CWS, Kim RN, BSN Entered By: Gretta Cool, BSN, RN, CWS, Kim on 08/06/2017 11:44:37 Sundberg, [name]  ([id number]) -------------------------------------------------------------------------------- Wound Assessment Details Patient Name: Mattie, Lainee V. Date of Service: 08/06/2017 11:00 AM Medical Record Number: [id number] Patient Account Number: [id number] Date of Birth/Sex: 09/15/1940 (77 y.o. Female) Treating RN: Cornell Barman Primary Care Sabena Winner: Beverlyn Roux Other Clinician: Referring Syerra Abdelrahman: Beverlyn Roux Treating Ginevra Tacker/Extender: Tito Dine in Treatment: 39 Wound Status Wound Number: 7 Primary Diabetic Wound/Ulcer of the Lower Extremity Etiology: Wound Location: Right Metatarsal head first - Dorsal Wound Open Wounding Event: Gradually Appeared Status: Date Acquired: 08/06/2013 Comorbid Cataracts, Chronic sinus problems/congestion, Weeks Of Treatment: 39 History: Congestive Heart Failure, Hypertension, Type II Clustered Wound: No Diabetes Photos Wound Measurements Length: (cm) 2 Width: (cm) 0.6 Depth: (cm) 0.3 Area: (cm) 0.942 Volume: (cm) 0.283 % Reduction in Area: 20% % Reduction in Volume: 19.8% Epithelialization: Small (1-33%) Tunneling: No Undermining: No Wound Description Classification: Grade 2 Wound Margin: Flat and Intact Exudate Amount: Medium Exudate Type: Serous Exudate Color: amber Foul Odor After Cleansing: No Slough/Fibrino Yes Wound Bed Granulation Amount: Medium (34-66%) Exposed Structure Granulation Quality: Red Fascia Exposed: No Necrotic Amount: Medium (34-66%) Fat Layer (Subcutaneous Tissue) Exposed: Yes Necrotic Quality: Adherent Slough Tendon Exposed: No Muscle Exposed: No Joint Exposed: No Bone Exposed: No Periwound Skin Texture Texture Color No Abnormalities Noted: No No Abnormalities Noted: No Callus: No Atrophie Blanche: No Foskett, Arisbel V. ([id number]) Crepitus: No Cyanosis: No Excoriation: No Ecchymosis: No Induration: Yes Erythema: No Rash: No Hemosiderin Staining: Yes Scarring: No Mottled:  No Pallor:  No Moisture Rubor: No No Abnormalities Noted: No Dry / Scaly: No Temperature / Pain Maceration: No Tenderness on Palpation: Yes Wound Preparation Ulcer Cleansing: Rinsed/Irrigated with Saline Topical Anesthetic Applied: Other: lidocaine 4%, Treatment Notes Wound #7 (Right, Dorsal Metatarsal head first) 1. Cleansed with: Clean wound with Normal Saline 2. Anesthetic Topical Lidocaine 4% cream to wound bed prior to debridement Notes sorbact, heel foam and gauze wrap Electronic Signature(s) Signed: 08/07/2017 5:50:18 PM By: Gretta Cool, BSN, RN, CWS, Kim RN, BSN Entered By: Gretta Cool, BSN, RN, CWS, Kim on 08/06/2017 11:22:44 Nau, [name] ([id number]) -------------------------------------------------------------------------------- [place] Details Patient Name: Mcglothen, Perlita V. Date of Service: 08/06/2017 11:00 AM Medical Record Number: [id number] Patient Account Number: [id number] Date of Birth/Sex: 07-22-1941 (77 y.o. Female) Treating RN: Cornell Barman Primary Care Zane Samson: Beverlyn Roux Other Clinician: Referring Alianis Trimmer: Beverlyn Roux Treating Niquita Digioia/Extender: Tito Dine in Treatment: 39 Vital Signs Time Taken: 11:17 Temperature (F): 97.6 Height (in): 64 Pulse (bpm): 82 Weight (lbs): 200 Respiratory Rate (breaths/min): 16 Body Mass Index (BMI): 34.3 Blood Pressure (mmHg): 169/52 Reference Range: 80 - 120 mg / dl Electronic Signature(s) Signed: 08/07/2017 5:50:18 PM By: Gretta Cool, BSN, RN, CWS, Kim RN, BSN Entered By: Gretta Cool, BSN, RN, CWS, Kim on 08/06/2017 11:18:05

## 2017-08-08 NOTE — Progress Notes (Signed)
[name] ([id number]) Visit Report for 08/06/2017 HPI Details Patient Name: White, [name] Date of Service: 08/06/2017 11:00 AM Medical Record Number: [id number] Patient Account Number: [id number] Date of Birth/Sex: 03-17-1941 (77 y.o. Female) Treating RN: [name] Primary Care Provider: Beverlyn White Other Clinician: Referring Provider: Beverlyn White Treating Provider/Extender: [name] in Treatment: 39 History of Present Illness HPI Description: 08/13/16: This is a 77 year old woman who came predominantly for review of 3 cm in diameter circular wound to the left anterior lateral leg. She was in the ER on 08/01/16 I reviewed their notes. There was apparently pus coming out of the wound at that time and the patient arrived requesting debridement which they don't do in the emergency room. Nevertheless I can't see that they did any x-rays. There were no cultures done. She is a type II diabetic and I a note after the patient was in the clinic that she had a bypass graft from the popliteal to the tibial on the right on 02/28/16. She also had a right greater saphenous vein harvest on the same date for arterial bypass. She is going to have vascular studies including ABIs T ABIs on the right on 08/28/16. The patient's surgery was on 02/28/16 by [name] she had a right below the knee popliteal artery to peroneal artery bypass with reverse greater saphenous vein and an endarterectomy of the mid segment peroneal artery. Postoperatively she had a strong mild monophasic peroneal signal with a pink foot. It would appear that the patient is had some nonhealing in the surgical saphenous vein harvest site on the left leg. Surprisingly looking through [facility] I cannot see much information about this at all. [name] notes from 05/29/16 show that the patient's wounds "are not healed" the right first metatarsal wound healed but then opened back up. 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 At that point the patient had a wound VAC to her right calf with regards to the wounds on her dorsal right toe would appear that these are felt to be arterial wounds. She has had surgery on the metatarsal phalangeal in 2015 I [name] secondary to a right metatarsal phalangeal joint fracture. She is apparently had discoloration around this area since then. 08/28/16; patient arrives with her wounds in much the same condition. The linear vein harvest site and the circular wound below it which I think was a blister. She also has to probing holes in her right great toe and a necrotic eschar on the right second toe. Because of these being arterial wounds I reduced her compression from 3-2 layers this seems to of done satisfactorily she has not had any problems. I cannot see that she is actually had an x-ray ====== 11/06/16 the patient comes in for evaluation of her right lower extremity ulcers. She was here in January 2018 for 2 visits subsequently ended up in the hospital with pneumonia and then to rehabilitation. She has now been discharged from rehabilitation and is home. She has multiple ulcerations to her right lower extremity including the foot and toes. She does have home health in place and they have been placing alginate to the ulcers. She is followed by [name] of vascular medicine. 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 She recently saw him on 3/23. In office ABIs were:  Right 0.48 with monophasic flow to the DP, PT, peroneal Left 0.63 with monophasic flow to the DP and PT Her arterial studies indicated a patent right below knee popliteal to peroneal bypass She had an MRI in February 2008 that was negative frosty myelitis but this showed general soft tissue edema in the right foot and lower extremity concerning  for cellulitis She is a diabetic, managed with insulin. Her hemoglobin A1c in December 2017 was 8.4 which is a trend up from previous levels. She had blood work in February 2018 which revealed an albumin of 2.6 this appears to be relatively acute as an albumin in November 2017 was 3.7 11/13/16; this is a patient I have not seen since February who is readmitted to our clinic last week. She is a type II diabetic on insulin with known severe PAD status post revascularization in the left leg by [name] I have reviewed [name] notes from March/23/18. Doppler ABI on that date showed an ABI on the right of 0.48 and on the left of 0.63. Dorsalis pedis waveforms were monophasic bilaterally. There was no waveforms detected at the posterior tibial on the right, monophasic on the left. [name], [name] ([id number]) White's comments were that this patient would have follow-up vascular studies in 3 months including ABIs and right lower extremity arterial duplex. She had an MRI in February that was negative for osteomyelitis but showed generalized edema in the foot. Last albumin I see was in January at 3.4 we have been using Santyl to the 4 wounds on the right leg the patient is noted today to have widespread edema well up towards her groin this is pitting 2-3+. 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 Normal ejection fraction. 11/20/16; patient has a follow-up appointment with [name] on April 23. She is still complaining of a lot of pain in the right foot and right leg. It is not clear to me that this is at all positional however I think it is clear claudication with minimal activity perhaps at rest. At our suggestion she is return to her primary physician's office tomorrow with regards to her pitting lower extremity bilateral edema that I reviewed in detail last week 11/27/16; the follow-up with [name] was actually on May 23 on April 23 as I  stated in my note last week. N/A case all of her wounds seems somewhat smaller. The 2 on the right leg are definitely smaller. The areas on the dorsal right first toe, right third toe and the lateral part of the right fifth metatarsal head all looks smaller but have tightly adherent surfaces. We have been using Santyl 12/04/16; follow-up with [name] on May 23. 2 small open wounds on the right leg continue to get smaller. The area on the right third total lateral aspect of the right fifth toe also look better. The remaining area on the dorsal first toe still has some depth to it. We have been using Santyl to the toes and collagen on the right 12/11/16; according to patient's husband the follow-up with [name] is not until July. All of the wounds on the right leg are measuring smaller. We have been using some combination of Prisma and Santyl although I think we can go to straight Prisma today. There may have been some confusion with home health about the primary dressing orders here. 12/25/16; the patient has had some healing this week. The area on her right  lateral fifth metatarsal head, right third toe are both healed lower right leg is healed. In the vein harvest site superiorly she has one superficial open area. On the dorsal aspect of her right great toe/MTP joint the wound is now divided into 2 however the proximal area is deep and there is palpable bone 01/01/17; still an open area in the middle of her original right surgical scar. The area on the right third toe and right lateral fifth metatarsal head remained closed. Problematic area on the dorsal aspect of the toe. Previous surgery in this area line 01/08/17 small open area on the original scar on her upper anterior leg although this is closing. X-ray I did of the right first toe did not show underlying bony abnormality. Still this area on the dorsal first toe probes to bone. We have been using Prisma 01/15/17; small open area in the original scar  in the upper anterior leg is almost fully closed. She has 2 open areas over the dorsal aspect of the right first toe that probes to bone. Used and a form starting last week. Vigorous bone scraping that I did last week showed few methicillin sensitive staph aureus. She is allergic to penicillin and sulfa drugs. I'[name] going to give her 2 weeks of doxycycline. She will need an MRI with contrast. 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 She continues on doxycycline for a bone scraping that showed methicillin sensitive staph aureus [allergic to penicillin and sulfa]. We have been using Endo form to the wound. 01/29/17; surprisingly her MRI did not show osteomyelitis. She continues on doxycycline for a bone scraping that showed methicillin sensitive staph aureus with allergies to penicillin and sulfa. We have been using Endo form to the wound. Unfortunately I cannot get a surface on this visit looks like it is able to support a healing state. Proximally there is still exposed bone. There is no overt soft tissue tenderness. Her MRI did show a previous fracture was surgery to this area but no hardware 02/12/17 on evaluation today patient appears to be doing well in regard to her lower extremity wound. She does have some mild discomfort but this is minimal. There's no evidence of infection. Her left great toe nail has somewhat been lifted up and there is a little bit of slight bleeding underneath but this is still firmly attached. 02/19/17; she ran out of doxycycline 2 days ago. Nevertheless I would like to continue this for 2 weeks to make a full 6 weeks of therapy as the bone scraping that I did from the open area on the dorsal right first toe showed MRSA. This should complete antibiotic therapy. 02/26/17; the patient is completing her 6 weeks of oral doxycycline. Deep wound on the dorsal right toe. This still has some exposed bone  proximally. She does have a reasonably granulated surface albeit thin over bone. I've been using Endo form have made application for an amniotic skin sub 03/05/17; the patient has completed 6 weeks of doxycycline. This is a deep wound on the dorsal right toe which has exposed bone proximally. She has granulation over most of this wound albeit a thin layer. I've been using Endo form. Still do not have approval for Affinity 03/13/17 on evaluation today patient's right foot wound appears to be doing better measurement wise compared to her last evaluation. She has been tolerating the dressing change without complication and  does have a appointment with the dietary that has been made as far as referral is concerned there just waiting for her and transportation to contact them back for an actual date. Nonetheless patient has been having less pain at this point. No fevers, chills, nausea, or vomiting noted at this time. 03/26/17; linear wound over the dorsal aspect of her right great toe. At one point this had exposed bone on the most superior aspect however I am pleased to see today that this appears to have a surface of granulation. Apparently product was applied for but denied by [facility]. We'll need to see what that was. The patient has known PAD status post revascularization. MRI White, [name] ([id number]) did not show osteomyelitis which was done in June 04/02/17; continued improvement using Endoform. She was approved for [employer] but hasn't over $623 co-pay per application, this is beyond her means 04/09/17; continues on Endoform. 04/16/17; appears to be doing nicely continuing on Endoform 04/22/17; patient did not have her dressing changed all week because of the weather. Using Endoform. Base of the wound looks healthy elbow there appears to events surrounding maceration perhaps because of the drainage was not changing the wound dressing 04/30/17 wound today continues to close and except for a small divot on  the superior part of the wound. This area did not probe the bone but I found this a little concerning as this was the area with exposed bone before we be able to get this to granulate forward. As I remember things she is not a candidate for skin substitutes secondary to an outlandish co-pay. I asked her husband to look into the out of pocket max for medications if he has 1 [Regranex] or totally for skin substitutes 05/07/17; the patient arrives today with a wound roughly the same size although under careful inspection under the light there appears to be some epithelialization. Her intake nurse noted that the Endoform seemed to be placed over the wound rather than in the deeper divots they could really benefit from the Endoform. At this point I have no plans to change the Endoform as at one point this was at least 33% exposed bone and the rest of the wound very close to the underlying phalanx 05/14/17; no major change in the size or appearance of the wound. We have been using Endoform for a prolonged period of time with reasonably good improvement in the epithelialization i.e. no exposed bone especially proximally. Unless we've not made a lot of changes in the last several weeks. She does not complain of pain. She was revascularized early this year or perhaps late last year by [name] and I wonder if he will need to have a look at her, I will need to review the actual vascular procedure which I think was a distal popliteal bypass 05/21/17; switched to either for a blue last week. Patient arrived in clinic with the wound not measuring any better but the surface looking some better. Unfortunately she had some surface slough and when I went to remove this superiorly she had exposed bone. Previously she had had exposed bone in the inferior part of the wound however this is granulated over some weeks ago. Clearly this is a major step back for her. With some difficulty I was able to obtain a piece of the  bone probing through the superior part of the wound for culture. We did not send pathology. It is likely that she is going to need imaging of this site but  I did not order this today 05/28/17; bone culture I took last week showed MSSA. She still has exposed bone however I could not get another piece for pathology. She had an MRI 4 months ago that did not show osteoarthritic at time. Wound rapidly deteriorated superiorly and I suspect this is underlying osteomyelitis. We have been using Hydrofera Blue 06/04/17 on evaluation today patient's wound appears to actually be doing a little bit better visually compared to last week's evaluation which is good news. Nonetheless she does have osteomyelitis of the toe which does not appear to be MRSA. No fevers, chills, nausea, or vomiting noted at this time. She is having no discomfort. 06/11/17; patient's wound looks a little healthier than I remember seeing it 2 weeks ago. There is no exposed bone and the surface of the wound appears well granulated. We've been using hydrofera blue. I note that she was started on doxycycline which was MSSA. Her appointment with Dr. Ola Spurr is on November 12 I believe. She has bone documenting MSSA osteomyelitis 06/18/17; patient saw [name] of vascular surgery on 11/9. He offered right leg angiography possible intervention however the patient refused. I've discussed this with the patient's husband today she did not want any further procedures. Also noteworthy that [name] stated that this wound had not healed in 3 years, I was not aware of this degree of chronicity in this area. She has underlying osteomyelitis by bone culture. Culture of this grew MSSA, she is allergic to penicillin and therefore not a candidate for beta lactams. She saw Dr. Ola Spurr of infectious disease this morning, he continued her on doxycycline for another month and apparently sent in a prescription. We have been using Hydrofera Blue. ABI's  update by [name] right 0.62, left 0.89. Monophasic wave forms 06/25/17 on evaluation today patient appears to be doing well in regard to her right great toe wound. She is still taking the doxycycline as prescribed by Dr. Ola Spurr. The Hydrofera Blue Dressing's to be doing as well as anything has for her up to this point and we are still waiting on an insurance authorization for the [place]. She is having no pain which is good news. No fevers, chills, nausea, or vomiting noted at this time. 07/02/17; still taking doxycycline as prescribed by Dr. Ola Spurr. Hydrofera Blue continues. We have no palpable bone. Still have not had had approval of Apligraf 08/06/16; the patient arrives today with [facility] even though we apparently had changed to Sorbact last time. Her co-pay for Regranex is $389 in discussion with the patient and her husband this was excessive. We'll continue with the Sorbact and standard dressings for now Electronic Signature(s) Signed: 08/06/2017 5:48:27 PM By: Linton Ham MD White, [name] ([id number]) Entered By: Linton Ham on 08/06/2017 12:24:39 White, [name] ([id number]) -------------------------------------------------------------------------------- Physical Exam Details Patient Name: Peretz, Ahtziri V. Date of Service: 08/06/2017 11:00 AM Medical Record Number: [id number] Patient Account Number: [id number] Date of Birth/Sex: 06-11-1941 (77 y.o. Female) Treating RN: [name] Primary Care Provider: Beverlyn White Other Clinician: Referring Provider: Beverlyn White Treating Provider/Extender: [name] in Treatment: 3 Constitutional Patient is hypertensive.. Pulse regular and within target range for patient.[name] Respirations regular, non-labored and within target range.. Temperature is normal and within the target range for the patient.. Eyes Conjunctivae clear. No discharge. Respiratory Respiratory effort is easy and symmetric bilaterally. Rate is  normal at rest and on room air.. Cardiovascular Pedal pulses absent bilaterally.this could be related to the edema in her  lower extremities. Lymphatic none palpable in the popliteal area bilaterally. Psychiatric No evidence of depression, anxiety, or agitation. Calm, cooperative, and communicative. Appropriate interactions and affect.. Notes wound exam; the patient's wound is on the dorsal toe at one point had exposed bone but there is none at that today. Surface still has some surface slough although I only wash this with gauze. No debridement was done. There is no evidence of surrounding infection Electronic Signature(s) Signed: 08/06/2017 5:48:27 PM By: Linton Ham MD Entered By: Linton Ham on 08/06/2017 12:26:17 Scarbro, Lamiyah [name] ([id number]) -------------------------------------------------------------------------------- Physician Orders Details Patient Name: Paolella, Malesha V. Date of Service: 08/06/2017 11:00 AM Medical Record Number: [id number] Patient Account Number: [id number] Date of Birth/Sex: [birth year] (77 y.o. Female) Treating RN: [name] Primary Care Provider: Beverlyn White Other Clinician: Referring Provider: Beverlyn White Treating Provider/Extender: [name] in Treatment: 64 Verbal / Phone Orders: No Diagnosis Coding Wound Cleansing Wound #7 Right,Dorsal Metatarsal head first o Clean wound with Normal Saline. o Cleanse wound with mild soap and water Anesthetic (add to Medication List) Wound #7 Right,Dorsal Metatarsal head first o Topical Lidocaine 4% cream applied to wound bed prior to debridement (In Clinic Only). Primary Wound Dressing Wound #7 Right,Dorsal Metatarsal head first o Other: - Sorbact Secondary Dressing Wound #7 Right,Dorsal Metatarsal head first o ABD pad o Conform/Kerlix Dressing Change Frequency Wound #7 Right,Dorsal Metatarsal head first o Change Dressing Monday, Wednesday, Friday Edema Control Wound #7  Right,Dorsal Metatarsal head first o Elevate legs to the level of the heart and pump ankles as often as possible Additional Orders / Instructions Wound #7 Right,Dorsal Metatarsal head first o Increase protein intake. o Activity as tolerated Home Health Wound #7 Right,Dorsal Metatarsal head first o Continue Home Health Visits - [facility] weekly, [facility] o Home Health Nurse may visit PRN to address patientos wound care needs. o FACE TO FACE ENCOUNTER: MEDICARE and MEDICAID PATIENTS: I certify that this patient is under my care and that I had a face-to-face encounter that meets the physician face-to-face encounter requirements with this patient on this date. The encounter with the patient was in whole or in part for the following MEDICAL CONDITION: (primary reason for [place]) MEDICAL NECESSITY: I certify, that based on my findings, NURSING services are a medically necessary home health service. HOME BOUND STATUS: I certify that my clinical findings support that this patient is homebound (i.e., Due to illness or injury, pt requires aid of supportive devices such as crutches, cane, wheelchairs, walkers, the use of special transportation or the Hewes, [name] ([id number]) assistance of another person to leave their place of residence. There is a normal inability to leave the home and doing so requires considerable and taxing effort. Other absences are for medical reasons / religious services and are infrequent or of short duration when for other reasons). o If current dressing causes regression in wound condition, may D/C ordered dressing product/s and apply Normal Saline Moist Dressing daily until next [place] / Other MD appointment. [place] of regression in wound condition at [phone]. o Please direct any NON-WOUND related issues/requests for orders to patient's Primary Care Physician Medications-please add  to medication list. Wound #7 Right,Dorsal Metatarsal head first o P.O. Antibiotics - continue antibiotics as prescribed Patient Medications Allergies: penicillin, sulfamethizole, iodine, Shellfish Containing Products, eggshell membrane Notifications Medication Indication Start End lidocaine DOSE topical 4 % cream - cream topical Electronic Signature(s) Signed: 08/06/2017 5:48:27 PM By: Linton Ham MD Signed:  08/07/2017 5:50:18 PM By: Gretta Cool, BSN, RN, CWS, Kim RN, BSN Entered By: Gretta Cool, BSN, RN, CWS, Kim on 08/06/2017 11:39:28 LAELANI, VASKO ([id number]) -------------------------------------------------------------------------------- Prescription 08/06/2017 Patient Name: [name] Provider: Ricard Dillon MD Date of Birth: 19-Nov-1940 NPI#: [id number] Sex: F DEA#: [id number] Phone #: [phone] License #: [id number] Patient Address: [facility] [place], [place] [address] [address] [place], [place] [address] [phone] Allergies penicillin sulfamethizole iodine Shellfish Containing Products eggshell membrane Medication Medication: Route: Strength: Form: lidocaine 4 % topical cream topical 4% cream Class: TOPICAL LOCAL ANESTHETICS Dose: Frequency / Time: Indication: cream topical Number of Refills: Number of Units: 0 Generic Substitution: Start Date: End Date: One Time Use: Substitution Permitted No Note to Pharmacy: Signature(s): Date(s): PARA, COSSEY ([id number]) Electronic Signature(s) Signed: 08/06/2017 5:48:27 PM By: Linton Ham MD Signed: 08/07/2017 5:50:18 PM By: Gretta Cool, BSN, RN, CWS, Kim RN, BSN Entered By: Gretta Cool, BSN, RN, CWS, Kim on 08/06/2017 11:39:29 Mccutcheon, [name] ([id number]) --------------------------------------------------------------------------------  Problem List Details Patient Name: White, [name] Date of Service: 08/06/2017  11:00 AM Medical Record Number: [id number] Patient Account Number: [id number] Date of Birth/Sex: 1940-10-31 (77 y.o. 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 Date of Service: 08/06/2017 11:00 AM Medical Record Number: [id number] Patient Account Number: [id number] Date of Birth/Sex: Feb 26, 1941 (77 y.o. Female) Treating RN: [name] Primary Care Provider: Beverlyn White Other Clinician: Referring Provider: Beverlyn White Treating Provider/Extender: [name] in Treatment: 39 Subjective History of Present Illness (HPI) 08/13/16: This is a 77 year old woman who came predominantly for review of 3 cm in diameter circular wound to the left anterior lateral leg. She  was in the ER on 08/01/16 I reviewed their notes. There was apparently pus coming out of the wound at that time and the patient arrived requesting debridement which they don't do in the emergency room. Nevertheless I can't see that they did any x-rays. There were no cultures done. She is a type II diabetic and I a note after the patient was in the clinic that she had a bypass graft from the popliteal to the tibial on the right on 02/28/16. She also had a right greater saphenous vein harvest on the same date for arterial bypass. She is going to have vascular studies including ABIs T ABIs on the right on 08/28/16. The patient's surgery was on 02/28/16 by [name] she had a right below the knee popliteal artery to peroneal artery bypass with reverse greater  saphenous vein and an endarterectomy of the mid segment peroneal artery. Postoperatively she had a strong mild monophasic peroneal signal with a pink foot. It would appear that the patient is had some nonhealing in the surgical saphenous vein harvest site on the left leg. Surprisingly looking [facility] I cannot see much information about this at all. [name] notes from 05/29/16 show that the patient's wounds "are not healed" the right first metatarsal wound healed but then opened back up. 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 At that point the patient had a wound VAC to her right calf with regards to the wounds on her dorsal right toe would appear that these are felt to be arterial wounds. She has had surgery on the metatarsal phalangeal in 2015 I [name] secondary to a right metatarsal phalangeal joint fracture. She is apparently had discoloration around this area since then. 08/28/16; patient arrives with her wounds in much the same condition. The linear vein harvest site and the circular wound below it which I think was a  blister. She also has to probing holes in her right great toe and a necrotic eschar on the right second toe. Because of these being arterial wounds I reduced her compression from 3-2 layers this seems to of done satisfactorily she has not had any problems. I cannot see that she is actually had an x-ray ====== 11/06/16 the patient comes in for evaluation of her right lower extremity ulcers. She was here in January 2018 for 2 visits subsequently ended up in the hospital with pneumonia and then to rehabilitation. She has now been discharged from rehabilitation and is home. She has multiple ulcerations to her right lower extremity including the foot and toes. She does have home health in place and they have been placing alginate to the ulcers. She is followed by [name] of vascular medicine. 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 She recently saw him on 3/23. In office ABIs were: Right 0.48 with monophasic flow to the DP, PT, peroneal Left 0.63 with monophasic flow to the DP and PT Her arterial studies indicated a patent right below knee popliteal to peroneal bypass She had an MRI in February 2008 that was negative frosty myelitis but this showed general soft tissue edema in the right foot and lower extremity concerning for cellulitis She is a diabetic, managed with insulin. Her hemoglobin A1c in December 2017 was 8.4 which is a trend up from previous levels. She had blood work in February 2018 which revealed an albumin of 2.6 this appears to be relatively acute as an albumin in November 2017 was 3.7 11/13/16; this is a patient I have not seen since February who is readmitted to our clinic last week. She is a type II diabetic on insulin with known severe PAD status post revascularization in the left leg by [name] I have reviewed [name] notes from March/23/18. Doppler ABI on that date showed an ABI on the right of 0.48 and  on the left of 0.63. Dorsalis pedis waveforms were monophasic bilaterally. There was no waveforms detected at the posterior tibial on the right, monophasic on the left. [name] comments were that this patient would have follow-up vascular studies in 3 months including ABIs and right lower extremity arterial duplex. She had an MRI in February  that was negative for osteomyelitis but showed generalized edema in White, [name] ([id number]) the foot. Last albumin I see was in January at 3.4 we have been using Santyl to the 4 wounds on the right leg the patient is noted today to have widespread edema well up towards her groin this is pitting 2-3+. 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 Normal ejection fraction. 11/20/16; patient has a follow-up appointment with [name] on April 23. She is still complaining of a lot of pain in the right foot and right leg. It is not clear to me that this is at all positional however I think it is clear claudication with minimal activity perhaps at rest. At our suggestion she is return to her primary physician's office tomorrow with regards to her pitting lower extremity bilateral edema that I reviewed in detail last week 11/27/16; the follow-up with [name] was actually on May 23 on April 23 as I stated in my note last week. N/A case all of her wounds seems somewhat smaller. The 2 on the right leg are definitely smaller. The areas on the dorsal right first toe, right third toe and the lateral part of the right fifth metatarsal head all looks smaller but have tightly adherent surfaces. We have been using Santyl 12/04/16; follow-up with [name] on May 23. 2 small open wounds on the right leg continue to get smaller. The area on the right third total lateral aspect of the right fifth toe also look better. The remaining area on the dorsal first toe still has some depth to it. We have been using Santyl to the  toes and collagen on the right 12/11/16; according to patient's husband the follow-up with [name] is not until July. All of the wounds on the right leg are measuring smaller. We have been using some combination of Prisma and Santyl although I think we can go to straight Prisma today. There may have been some confusion with home health about the primary dressing orders here. 12/25/16; the patient has had some healing this week. The area on her right lateral fifth metatarsal head, right third toe are both healed lower right leg is healed. In the vein harvest site superiorly she has one superficial open area. On the dorsal aspect of her right great toe/MTP joint the wound is now divided into 2 however the proximal area is deep and there is palpable bone 01/01/17; still an open area in the middle of her original right surgical scar. The area on the right third toe and right lateral fifth metatarsal head remained closed. Problematic area on the dorsal aspect of the toe. Previous surgery in this area line 01/08/17 small open area on the original scar on her upper anterior leg although this is closing. X-ray I did of the right first toe did not show underlying bony abnormality. Still this area on the dorsal first toe probes to bone. We have been using Prisma 01/15/17; small open area in the original scar in the upper anterior leg is almost fully closed. She has 2 open areas over the dorsal aspect of the right first toe that probes to bone. Used and a form starting last week. Vigorous bone scraping that I did last week showed few methicillin sensitive staph aureus. She is allergic to penicillin and sulfa drugs. I'[name] going to give her 2 weeks of doxycycline. She will need an MRI with contrast. 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 She continues on doxycycline for a bone scraping that showed methicillin sensitive staph aureus  [allergic to penicillin and sulfa]. We have been using Endo form to the wound. 01/29/17; surprisingly her MRI did not show osteomyelitis. She continues on doxycycline for a bone scraping that showed methicillin sensitive staph aureus with allergies to penicillin and sulfa. We have been using Endo form to the wound. Unfortunately I cannot get a surface on this visit looks like it is able to support a healing state. Proximally there is still exposed bone. There is no overt soft tissue tenderness. Her MRI did show a previous fracture was surgery to this area but no hardware 02/12/17 on evaluation today patient appears to be doing well in regard to her lower extremity wound. She does have some mild discomfort but this is minimal. There's no evidence of infection. Her left great toe nail has somewhat been lifted up and there is a little bit of slight bleeding underneath but this is still firmly attached. 02/19/17; she ran out of doxycycline 2 days ago. Nevertheless I would like to continue this for 2 weeks to make a full 6 weeks of therapy as the bone scraping that I did from the open area on the dorsal right first toe showed MRSA. This should complete antibiotic therapy. 02/26/17; the patient is completing her 6 weeks of oral doxycycline. Deep wound on the dorsal right toe. This still has some exposed bone proximally. She does have a reasonably granulated surface albeit thin over bone. I've been using Endo form have made application for an amniotic skin sub 03/05/17; the patient has completed 6 weeks of doxycycline. This is a deep wound on the dorsal right toe which has exposed bone proximally. She has granulation over most of this wound albeit a thin layer. I've been using Endo form. Still do not have approval for Affinity 03/13/17 on evaluation today patient's right foot wound appears to be doing better measurement wise compared to her last evaluation. She has been tolerating the dressing change without  complication and does have a appointment with the dietary that has been made as far as referral is concerned there just waiting for her and transportation to contact them back for an actual date. Nonetheless patient has been having less pain at this point. No fevers, chills, nausea, or vomiting noted at this time. 03/26/17; linear wound over the dorsal aspect of her right great toe. At one point this had exposed bone on the most superior aspect however I am pleased to see today that this appears to have a surface of granulation. Apparently product was applied for but denied by [facility]. We'll need to see what that was. The patient has known PAD status post revascularization. MRI did not show osteomyelitis which was done in June 04/02/17; continued improvement using Endoform. She was approved for [employer] but hasn't over $542 co-pay per application, White, [name] ([id number]) this is beyond her means 04/09/17; continues on Endoform. 04/16/17; appears to be doing nicely continuing on Endoform 04/22/17; patient did not have her dressing changed all week because of the weather. Using Endoform. Base of the wound looks healthy elbow there appears to events surrounding maceration perhaps because of the drainage was not changing the wound dressing 04/30/17 wound today continues to close and except for a small divot on the superior part of the wound. This area did not probe the bone but  I found this a little concerning as this was the area with exposed bone before we be able to get this to granulate forward. As I remember things she is not a candidate for skin substitutes secondary to an outlandish co-pay. I asked her husband to look into the out of pocket max for medications if he has 1 [Regranex] or totally for skin substitutes 05/07/17; the patient arrives today with a wound roughly the same size although under careful inspection under the light there appears to be some epithelialization. Her intake nurse noted  that the Endoform seemed to be placed over the wound rather than in the deeper divots they could really benefit from the Endoform. At this point I have no plans to change the Endoform as at one point this was at least 33% exposed bone and the rest of the wound very close to the underlying phalanx 05/14/17; no major change in the size or appearance of the wound. We have been using Endoform for a prolonged period of time with reasonably good improvement in the epithelialization i.e. no exposed bone especially proximally. Unless we've not made a lot of changes in the last several weeks. She does not complain of pain. She was revascularized early this year or perhaps late last year by [name] and I wonder if he will need to have a look at her, I will need to review the actual vascular procedure which I think was a distal popliteal bypass 05/21/17; switched to either for a blue last week. Patient arrived in clinic with the wound not measuring any better but the surface looking some better. Unfortunately she had some surface slough and when I went to remove this superiorly she had exposed bone. Previously she had had exposed bone in the inferior part of the wound however this is granulated over some weeks ago. Clearly this is a major step back for her. With some difficulty I was able to obtain a piece of the bone probing through the superior part of the wound for culture. We did not send pathology. It is likely that she is going to need imaging of this site but I did not order this today 05/28/17; bone culture I took last week showed MSSA. She still has exposed bone however I could not get another piece for pathology. She had an MRI 4 months ago that did not show osteoarthritic at time. Wound rapidly deteriorated superiorly and I suspect this is underlying osteomyelitis. We have been using Hydrofera Blue 06/04/17 on evaluation today patient's wound appears to actually be doing a little bit better  visually compared to last week's evaluation which is good news. Nonetheless she does have osteomyelitis of the toe which does not appear to be MRSA. No fevers, chills, nausea, or vomiting noted at this time. She is having no discomfort. 06/11/17; patient's wound looks a little healthier than I remember seeing it 2 weeks ago. There is no exposed bone and the surface of the wound appears well granulated. We've been using hydrofera blue. I note that she was started on doxycycline which was MSSA. Her appointment with Dr. Ola Spurr is on November 12 I believe. She has bone documenting MSSA osteomyelitis 06/18/17; patient saw [name] of vascular surgery on 11/9. He offered right leg angiography possible intervention however the patient refused. I've discussed this with the patient's husband today she did not want any further procedures. Also noteworthy that [name] stated that this wound had not healed in 3 years, I was not aware  of this degree of chronicity in this area. She has underlying osteomyelitis by bone culture. Culture of this grew MSSA, she is allergic to penicillin and therefore not a candidate for beta lactams. She saw Dr. Ola Spurr of infectious disease this morning, he continued her on doxycycline for another month and apparently sent in a prescription. We have been using Hydrofera Blue. ABI's update by [name] right 0.62, left 0.89. Monophasic wave forms 06/25/17 on evaluation today patient appears to be doing well in regard to her right great toe wound. She is still taking the doxycycline as prescribed by Dr. Ola Spurr. The Hydrofera Blue Dressing's to be doing as well as anything has for her up to this point and we are still waiting on an insurance authorization for the [place]. She is having no pain which is good news. No fevers, chills, nausea, or vomiting noted at this time. 07/02/17; still taking doxycycline as prescribed by Dr. Ola Spurr. Hydrofera Blue continues. We have  no palpable bone. Still have not had had approval of Apligraf 08/06/16; the patient arrives today with [facility] even though we apparently had changed to Sorbact last time. Her co-pay for Regranex is $389 in discussion with the patient and her husband this was excessive. We'll continue with the Sorbact and standard dressings for now White, [name] ([id number]) Objective Constitutional Patient is hypertensive.. Pulse regular and within target range for patient.[name] Respirations regular, non-labored and within target range.. Temperature is normal and within the target range for the patient.. Vitals Time Taken: 11:17 AM, Height: 64 in, Weight: 200 lbs, BMI: 34.3, Temperature: 97.6 F, Pulse: 82 bpm, Respiratory Rate: 16 breaths/min, Blood Pressure: 169/52 mmHg. Eyes Conjunctivae clear. No discharge. Respiratory Respiratory effort is easy and symmetric bilaterally. Rate is normal at rest and on room air.. Cardiovascular Pedal pulses absent bilaterally.this could be related to the edema in her lower extremities. Lymphatic none palpable in the popliteal area bilaterally. Psychiatric No evidence of depression, anxiety, or agitation. Calm, cooperative, and communicative. Appropriate interactions and affect.. General Notes: wound exam; the patient's wound is on the dorsal toe at one point had exposed bone but there is none at that today. Surface still has some surface slough although I only wash this with gauze. No debridement was done. There is no evidence of surrounding infection Integumentary (Hair, Skin) Wound #7 status is Open. Original cause of wound was Gradually Appeared. The wound is located on the Right,Dorsal Metatarsal head first. The wound measures 2cm length x 0.6cm width x 0.3cm depth; 0.942cm^2 area and 0.283cm^3 volume. There is Fat Layer (Subcutaneous Tissue) Exposed exposed. There is no tunneling or undermining noted. There is a medium amount of serous drainage noted. The  wound margin is flat and intact. There is medium (34-66%) red granulation within the wound bed. There is a medium (34-66%) amount of necrotic tissue within the wound bed including Adherent Slough. The periwound skin appearance exhibited: Induration, Hemosiderin Staining. The periwound skin appearance did not exhibit: Callus, Crepitus, Excoriation, Rash, Scarring, Dry/Scaly, Maceration, Atrophie Blanche, Cyanosis, Ecchymosis, Mottled, Pallor, Rubor, Erythema. The periwound has tenderness on palpation. 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 ([id number]) M86.371 - Chronic multifocal osteomyelitis, right ankle and foot Plan Wound Cleansing: Wound #7 Right,Dorsal Metatarsal head first: Clean wound with Normal Saline. Cleanse wound with mild soap and water Anesthetic (add to Medication List): Wound #7 Right,Dorsal Metatarsal head first: Topical Lidocaine 4% cream applied to wound bed prior to debridement (In Clinic Only). Primary Wound Dressing: Wound #7 Right,Dorsal Metatarsal head first: Other: - Sorbact Secondary Dressing: Wound #7 Right,Dorsal Metatarsal head first: ABD pad Conform/Kerlix Dressing Change Frequency: Wound #7 Right,Dorsal Metatarsal head first: Change Dressing Monday, Wednesday, Friday Edema Control: Wound #7 Right,Dorsal Metatarsal head first: Elevate legs to the level of the heart and pump ankles as often as  possible Additional Orders / Instructions: Wound #7 Right,Dorsal Metatarsal head first: Increase protein intake. Activity as tolerated Home Health: Wound #7 Right,Dorsal Metatarsal head first: [facility] weekly, [facility] Home Health Nurse may visit PRN to address patient s wound care needs. FACE TO FACE ENCOUNTER: MEDICARE and MEDICAID PATIENTS: I certify that this patient is under my care and that I had a face-to-face encounter that meets the physician face-to-face encounter requirements with this patient on this date. The encounter with the patient was in whole or in part for the following MEDICAL CONDITION: (primary reason for [place]) MEDICAL NECESSITY: I certify, that based on my findings, NURSING services are a medically necessary home health service. HOME BOUND STATUS: I certify that my clinical findings support that this patient is homebound (i.e., Due to illness or injury, pt requires aid of supportive devices such as crutches, cane, wheelchairs, walkers, the use of special transportation or the assistance of another person to leave their place of residence. There is a normal inability to leave the home and doing so requires considerable and taxing effort. Other absences are for medical reasons / religious services and are infrequent or of short duration when for other reasons). If current dressing causes regression in wound condition, may D/C ordered dressing product/s and apply Normal Saline Moist Dressing daily until next [place] / Other MD appointment. [place] of regression in wound condition at [phone]. Please direct any NON-WOUND related issues/requests for orders to patient's Primary Care Physician Medications-please add to medication list.: Wound #7 Right,Dorsal Metatarsal head first: P.O. Antibiotics - continue antibiotics as prescribed The following medication(s) was  prescribed: lidocaine topical 4 % cream cream topical was prescribed at facility [place]ley, Mckaylie V. ([id number]) #1 multifactorial wound on the right dorsal toe. This may have contracted somewhat in terms of overall area but not the depth. Surface of this had some debris although I didn't think this required debridement #2 continue the Sorbact for now #3 there is not an option for an advanced treatment option or Regranex Electronic Signature(s) Signed: 08/06/2017 5:48:27 PM By: Linton Ham MD Entered By: Linton Ham on 08/06/2017 12:27:10 [place], Nechama [name] ([id number]) -------------------------------------------------------------------------------- SuperBill Details Patient Name: Slaydon, Leilana V. Date of Service: 08/06/2017 Medical Record Number: [id number] Patient Account Number: [id number] Date of Birth/Sex: 1941/05/22 (77 y.o. 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

## 2017-08-20 ENCOUNTER — Encounter: Payer: Medicare HMO | Admitting: Internal Medicine

## 2017-08-20 DIAGNOSIS — E11622 Type 2 diabetes mellitus with other skin ulcer: Secondary | ICD-10-CM | POA: Diagnosis not present

## 2017-08-21 NOTE — Progress Notes (Signed)
MELISS, FLEEK ([id number]) Visit Report for 08/20/2017 Arrival Information Details Patient Name: [name], [name] Date of Service: 08/20/2017 9:15 AM Medical Record Number: [id number] Patient Account Number: [id number] Date of Birth/Sex: [birth year] (77 y.o. Female) Treating RN: Cornell Barman Primary Care Valory Wetherby: Beverlyn Roux Other Clinician: Referring Helana Macbride: Beverlyn Roux Treating Zekiel Torian/Extender: Tito Dine in Treatment: 64 Visit Information History Since Last Visit Added or deleted any medications: No Patient Arrived: Wheel Chair Any new allergies or adverse reactions: No Arrival Time: 09:16 Had a fall or experienced change in No activities of daily living that may affect Accompanied By: huisband risk of falls: Transfer Assistance: Manual Signs or symptoms of abuse/neglect since last visito No Patient Identification Verified: Yes Hospitalized since last visit: No Secondary Verification Process Completed: Yes Has Dressing in Place as Prescribed: Yes Patient Requires Transmission-Based No Pain Present Now: No Precautions: Patient Has Alerts: No Electronic Signature(s) Signed: 08/20/2017 4:27:21 PM By: Gretta Cool, BSN, RN, CWS, Kim RN, BSN Entered By: Gretta Cool, BSN, RN, CWS, Kim on 08/20/2017 09:17:20 Helmkamp, [name] ([id number]) -------------------------------------------------------------------------------- Encounter Discharge Information Details Patient Name: [name], [name] Date of Service: 08/20/2017 9:15 AM Medical Record Number: [id number] Patient Account Number: [id number] Date of Birth/Sex: 1941/03/11 (77 y.o. Female) Treating RN: Cornell Barman Primary Care Jeremia Groot: Beverlyn Roux Other Clinician: Referring Jakaila Norment: Beverlyn Roux Treating Edell Mesenbrink/Extender: Tito Dine in Treatment: 79 Encounter Discharge Information Items Discharge Pain Level: 0 Discharge Condition: Stable Ambulatory Status: Wheelchair Discharge Destination: Home Transportation: Private  Auto Accompanied By: husband Schedule Follow-up Appointment: Yes Medication Reconciliation completed and Yes provided to Patient/Care Steven Veazie: Provided on Clinical Summary of Care: 08/20/2017 Form Type Recipient Paper Patient [facility] Electronic Signature(s) Signed: 08/21/2017 8:30:26 AM By: Ruthine Dose Entered By: Ruthine Dose on 08/20/2017 09:49:01 Torrez, Opal V. ([id number]) -------------------------------------------------------------------------------- Lower Extremity Assessment Details Patient Name: [name], [name] Date of Service: 08/20/2017 9:15 AM Medical Record Number: [id number] Patient Account Number: [id number] Date of Birth/Sex: 10/16/1940 (77 y.o. Female) Treating RN: Cornell Barman Primary Care Shreyan Hinz: Beverlyn Roux Other Clinician: Referring Yuuki Skeens: Beverlyn Roux Treating Fabiano Ginley/Extender: Tito Dine in Treatment: 66 Vascular Assessment Pulses: Dorsalis Pedis Palpable: [Right:Yes] Posterior Tibial Extremity colors, hair growth, and conditions: Extremity Color: [Right:Hyperpigmented] Hair Growth on Extremity: [Right:No] Temperature of Extremity: [Right:Warm] Capillary Refill: [Right:> 3 seconds] Toe Nail Assessment Left: Right: Thick: Yes Discolored: Yes Deformed: Yes Improper Length and Hygiene: Yes Electronic Signature(s) Signed: 08/20/2017 4:27:21 PM By: Gretta Cool, BSN, RN, CWS, Kim RN, BSN Entered By: Gretta Cool, BSN, RN, CWS, Kim on 08/20/2017 09:22:18 Scott, Ozell [name] ([id number]) -------------------------------------------------------------------------------- Multi Wound Chart Details Patient Name: [name], [name] Date of Service: 08/20/2017 9:15 AM Medical Record Number: [id number] Patient Account Number: [id number] Date of Birth/Sex: 1941-07-14 (77 y.o. Female) Treating RN: Cornell Barman Primary Care Haron Beilke: Beverlyn Roux Other Clinician: Referring Magnus Crescenzo: Beverlyn Roux Treating Corene Resnick/Extender: Tito Dine in Treatment: 41 Vital  Signs Height(in): 64 Pulse(bpm): 28 Weight(lbs): 200 Blood Pressure(mmHg): 138/44 Body Mass Index(BMI): 34 Temperature(F): 97.7 Respiratory Rate 16 (breaths/min): Photos: [N/A:N/A] Wound Location: Right Metatarsal head first - N/A N/A Dorsal Wounding Event: Gradually Appeared N/A N/A Primary Etiology: Diabetic Wound/Ulcer of the N/A N/A Lower Extremity Comorbid History: Cataracts, Chronic sinus N/A N/A problems/congestion, Congestive Heart Failure, Hypertension, Type II Diabetes Date Acquired: 08/06/2013 N/A N/A Weeks of Treatment: 41 N/A N/A Wound Status: Open N/A N/A Measurements L x W x D 1.6x0.5x0.3 N/A N/A (cm) Area (cm) : 0.628 N/A N/A Volume (cm) : 0.188 N/A N/A % Reduction in Area:  46.70% N/A N/A % Reduction in Volume: 46.70% N/A N/A Classification: Grade 2 N/A N/A Exudate Amount: Medium N/A N/A Exudate Type: Serous N/A N/A Exudate Color: amber N/A N/A Wound Margin: Flat and Intact N/A N/A Granulation Amount: Small (1-33%) N/A N/A Granulation Quality: Red N/A N/A Necrotic Amount: Large (67-100%) N/A N/A Exposed Structures: Fat Layer (Subcutaneous N/A N/A Tissue) Exposed: Yes Fascia: No Tendon: No Muscle: No Jilek, Olyvia V. ([id number]) Joint: No Bone: No Epithelialization: Small (1-33%) N/A N/A Debridement: Debridement ([id number]) N/A N/A Pre-procedure 09:34 N/A N/A Verification/Time Out Taken: Pain Control: Other N/A N/A Tissue Debrided: Exudates, Subcutaneous N/A N/A Level: Skin/Subcutaneous Tissue N/A N/A Debridement Area (sq cm): 0.8 N/A N/A Instrument: Curette N/A N/A Bleeding: Minimum N/A N/A Hemostasis Achieved: Pressure N/A N/A Procedural Pain: 0 N/A N/A Post Procedural Pain: 0 N/A N/A Debridement Treatment Procedure was tolerated well N/A N/A Response: Post Debridement 1.6x0.5x0.4 N/A N/A Measurements L x W x D (cm) Post Debridement Volume: 0.251 N/A N/A (cm) Periwound Skin Texture: Induration: Yes N/A N/A Excoriation:  No Callus: No Crepitus: No Rash: No Scarring: No Periwound Skin Moisture: Maceration: No N/A N/A Dry/Scaly: No Periwound Skin Color: Hemosiderin Staining: Yes N/A N/A Atrophie Blanche: No Cyanosis: No Ecchymosis: No Erythema: No Mottled: No Pallor: No Rubor: No Tenderness on Palpation: Yes N/A N/A Wound Preparation: Ulcer Cleansing: N/A N/A Rinsed/Irrigated with Saline Topical Anesthetic Applied: Other: lidocaine 4% Procedures Performed: Debridement N/A N/A Treatment Notes Wound #7 (Right, Dorsal Metatarsal head first) 1. Cleansed with: Clean wound with Normal Saline 2. Anesthetic Topical Lidocaine 4% cream to wound bed prior to debridement 4. Dressing Applied: Other dressing (specify in notes) 5. Secondary Dressing Applied ABD and Kerlix/Conform Arave, Jamisen V. ([id number]) Notes sorbact, heel foam and gauze wrap Electronic Signature(s) Signed: 08/20/2017 4:56:29 PM By: Linton Ham MD Entered By: Linton Ham on 08/20/2017 09:48:51 Hannula, Karielle [name] ([id number]) -------------------------------------------------------------------------------- Multi-Disciplinary Care Plan Details Patient Name: Massiah, Cleta V. Date of Service: 08/20/2017 9:15 AM Medical Record Number: [id number] Patient Account Number: [id number] Date of Birth/Sex: 09-17-1940 (77 y.o. Female) Treating RN: Cornell Barman Primary Care Kutler Vanvranken: Beverlyn Roux Other Clinician: Referring Matricia Begnaud: Beverlyn Roux Treating Graycen Degan/Extender: Tito Dine in Treatment: 76 Active Inactive ` Abuse / Safety / Falls / Self Care Management Nursing Diagnoses: Impaired physical mobility Potential for falls Goals: Patient will remain injury free Date Initiated: 11/06/2016 Target Resolution Date: 12/30/2016 Goal Status: Active Patient/caregiver will verbalize understanding of skin care regimen Date Initiated: 11/06/2016 Target Resolution Date: 12/30/2016 Goal Status: Active Interventions: Assess fall risk  on admission and as needed Treatment Activities: Patient referred to home care : 11/06/2016 Notes: ` Nutrition Nursing Diagnoses: Potential for alteratiion in Nutrition/Potential for imbalanced nutrition Goals: Patient/caregiver verbalizes understanding of need to maintain therapeutic glucose control per primary care physician Date Initiated: 11/06/2016 Target Resolution Date: 12/30/2016 Goal Status: Active Interventions: Provide education on elevated blood sugars and impact on wound healing Notes: ` Orientation to the Wound Care Program Nursing Diagnoses: Knowledge deficit related to the wound healing center program ALEILA, SYVERSON ([id number]) Goals: Patient/caregiver will verbalize understanding of the [place] Date Initiated: 11/06/2016 Target Resolution Date: 12/30/2016 Goal Status: Active Interventions: Provide education on orientation to the wound center Notes: ` Venous Leg Ulcer Nursing Diagnoses: Actual venous Insuffiency (use after diagnosis is confirmed) Knowledge deficit related to disease process and management Goals: Non-invasive venous studies are completed as ordered Date Initiated: 11/06/2016 Target Resolution Date: 12/30/2016 Goal Status: Active Patient/caregiver will verbalize understanding of disease  process and disease management Date Initiated: 11/06/2016 Target Resolution Date: 12/30/2016 Goal Status: Active Interventions: Assess peripheral edema status every visit. Notes: ` Wound/Skin Impairment Nursing Diagnoses: Impaired tissue integrity Knowledge deficit related to smoking impact on wound healing Knowledge deficit related to ulceration/compromised skin integrity Goals: Ulcer/skin breakdown will heal within 14 weeks Date Initiated: 11/06/2016 Target Resolution Date: 02/05/2017 Goal Status: Active Interventions: Assess ulceration(s) every visit Treatment Activities: Skin care regimen initiated : 11/06/2016 Notes: Electronic  Signature(s) Signed: 08/20/2017 4:27:21 PM By: Gretta Cool, BSN, RN, CWS, Kim RN, BSN Bowron, Shayana V. ([id number]) Entered By: Gretta Cool, BSN, RN, CWS, Kim on 08/20/2017 09:23:58 Oldaker, Angelin [name] ([id number]) -------------------------------------------------------------------------------- Pain Assessment Details Patient Name: Mckissack, Krupa V. Date of Service: 08/20/2017 9:15 AM Medical Record Number: [id number] Patient Account Number: [id number] Date of Birth/Sex: January 26, 1941 (77 y.o. Female) Treating RN: Cornell Barman Primary Care Meshilem Machuca: Beverlyn Roux Other Clinician: Referring Avaline Stillson: Beverlyn Roux Treating Diandre Merica/Extender: Tito Dine in Treatment: 62 Active Problems Location of Pain Severity and Description of Pain Patient Has Paino No Site Locations With Dressing Change: No Pain Management and Medication Current Pain Management: Goals for Pain Management Topical or injectable lidocaine is offered to patient for acute pain when surgical debridement is performed. If needed, Patient is instructed to use over the counter pain medication for the following 24-48 hours after debridement. Wound care MDs do not prescribed pain medications. Patient has chronic pain or uncontrolled pain. Patient has been instructed to make an appointment with their Primary Care Physician for pain management. Electronic Signature(s) Signed: 08/20/2017 4:27:21 PM By: Gretta Cool, BSN, RN, CWS, Kim RN, BSN Entered By: Gretta Cool, BSN, RN, CWS, Kim on 08/20/2017 09:17:28 Tuch, [name] ([id number]) -------------------------------------------------------------------------------- Patient/Caregiver Education Details Patient Name: Ruggerio, Dewanna V. Date of Service: 08/20/2017 9:15 AM Medical Record Number: [id number] Patient Account Number: [id number] Date of Birth/Gender: 04/05/1941 (77 y.o. Female) Treating RN: Cornell Barman Primary Care Physician: Beverlyn Roux Other Clinician: Referring Physician: Beverlyn Roux Treating  Physician/Extender: Tito Dine in Treatment: 19 Education Assessment Education Provided To: Patient and Caregiver Education Topics Provided Welcome To [facility]: Wound/Skin Impairment: Handouts: Caring for Your Ulcer, Other: Orders sent to [facility] Methods: Demonstration, Explain/Verbal Responses: State content correctly Electronic Signature(s) Signed: 08/20/2017 4:27:21 PM By: Gretta Cool, BSN, RN, CWS, Kim RN, BSN Entered By: Gretta Cool, BSN, RN, CWS, Kim on 08/20/2017 09:47:51 Liston, [name] ([id number]) -------------------------------------------------------------------------------- Wound Assessment Details Patient Name: Goldfarb, Hermione V. Date of Service: 08/20/2017 9:15 AM Medical Record Number: [id number] Patient Account Number: [id number] Date of Birth/Sex: June 03, 1941 (77 y.o. Female) Treating RN: Cornell Barman Primary Care Ranell Skibinski: Beverlyn Roux Other Clinician: Referring Emri Sample: Beverlyn Roux Treating Lawrie Tunks/Extender: Tito Dine in Treatment: 41 Wound Status Wound Number: 7 Primary Diabetic Wound/Ulcer of the Lower Extremity Etiology: Wound Location: Right Metatarsal head first - Dorsal Wound Open Wounding Event: Gradually Appeared Status: Date Acquired: 08/06/2013 Comorbid Cataracts, Chronic sinus problems/congestion, Weeks Of Treatment: 41 History: Congestive Heart Failure, Hypertension, Type II Clustered Wound: No Diabetes Photos Wound Measurements Length: (cm) 1.6 Width: (cm) 0.5 Depth: (cm) 0.3 Area: (cm) 0.628 Volume: (cm) 0.188 % Reduction in Area: 46.7% % Reduction in Volume: 46.7% Epithelialization: Small (1-33%) Tunneling: No Undermining: No Wound Description Classification: Grade 2 Wound Margin: Flat and Intact Exudate Amount: Medium Exudate Type: Serous Exudate Color: amber Foul Odor After Cleansing: No Slough/Fibrino Yes Wound Bed Granulation Amount: Small (1-33%) Exposed Structure Granulation Quality: Red Fascia  Exposed: No Necrotic Amount: Large (67-100%) Fat Layer (Subcutaneous Tissue) Exposed: Yes  Necrotic Quality: Adherent Slough Tendon Exposed: No Muscle Exposed: No Joint Exposed: No Bone Exposed: No Periwound Skin Texture Texture Color No Abnormalities Noted: No No Abnormalities Noted: No Callus: No Atrophie Blanche: No Canedo, Braiden V. ([id number]) Crepitus: No Cyanosis: No Excoriation: No Ecchymosis: No Induration: Yes Erythema: No Rash: No Hemosiderin Staining: Yes Scarring: No Mottled: No Pallor: No Moisture Rubor: No No Abnormalities Noted: No Dry / Scaly: No Temperature / Pain Maceration: No Tenderness on Palpation: Yes Wound Preparation Ulcer Cleansing: Rinsed/Irrigated with Saline Topical Anesthetic Applied: Other: lidocaine 4%, Treatment Notes Wound #7 (Right, Dorsal Metatarsal head first) 1. Cleansed with: Clean wound with Normal Saline 2. Anesthetic Topical Lidocaine 4% cream to wound bed prior to debridement 4. Dressing Applied: Other dressing (specify in notes) 5. Secondary Dressing Applied ABD and Kerlix/Conform Notes sorbact, heel foam and gauze wrap Electronic Signature(s) Signed: 08/20/2017 4:27:21 PM By: Gretta Cool, BSN, RN, CWS, Kim RN, BSN Entered By: Gretta Cool, BSN, RN, CWS, Kim on 08/20/2017 09:21:48 Jozwiak, [name] ([id number]) -------------------------------------------------------------------------------- Vitals Details Patient Name: Dingus, Raynell V. Date of Service: 08/20/2017 9:15 AM Medical Record Number: [id number] Patient Account Number: [id number] Date of Birth/Sex: 09-28-1940 (77 y.o. Female) Treating RN: Cornell Barman Primary Care Edita Weyenberg: Beverlyn Roux Other Clinician: Referring Kjuan Seipp: Beverlyn Roux Treating Demetrus Pavao/Extender: Tito Dine in Treatment: 41 Vital Signs Time Taken: 09:17 Temperature (F): 97.7 Height (in): 64 Pulse (bpm): 79 Weight (lbs): 200 Respiratory Rate (breaths/min): 16 Body Mass Index (BMI):  34.3 Blood Pressure (mmHg): 138/44 Reference Range: 80 - 120 mg / dl Notes patient feels good today Electronic Signature(s) Signed: 08/20/2017 4:27:21 PM By: Gretta Cool, BSN, RN, CWS, Kim RN, BSN Entered By: Gretta Cool, BSN, RN, CWS, Kim on 08/20/2017 09:18:21

## 2017-08-21 NOTE — Progress Notes (Signed)
JONASIA, COINER ([id number]) Visit Report for 08/20/2017 Debridement Details Patient Name: [name], [name] Date of Service: 08/20/2017 9:15 AM Medical Record Number: [id number] Patient Account Number: [id number] Date of Birth/Sex: January 19, 1941 (77 y.o. Female) Treating RN: Cornell Barman Primary Care Provider: Beverlyn Roux Other Clinician: Referring Provider: Beverlyn Roux Treating Provider/Extender: Tito Dine in Treatment: 41 Debridement Performed for Wound #7 Right,Dorsal Metatarsal head first Assessment: Performed By: Physician Ricard Dillon, MD Debridement: Debridement Severity of Tissue Pre Fat layer exposed Debridement: Pre-procedure Verification/Time Yes - 09:34 Out Taken: Start Time: 09:35 Pain Control: Other : lidocaine 4% Level: Skin/Subcutaneous Tissue Total Area Debrided (L x W): 1.6 (cm) x 0.5 (cm) = 0.8 (cm) Tissue and other material Viable, Non-Viable, Exudate, Subcutaneous debrided: Instrument: Curette Bleeding: Minimum Hemostasis Achieved: Pressure End Time: 09:38 Procedural Pain: 0 Post Procedural Pain: 0 Response to Treatment: Procedure was tolerated well Post Debridement Measurements of Total Wound Length: (cm) 1.6 Width: (cm) 0.5 Depth: (cm) 0.4 Volume: (cm) 0.251 Character of Wound/Ulcer Post Debridement: Requires Further Debridement Severity of Tissue Post Debridement: Fat layer exposed Post Procedure Diagnosis Same as Pre-procedure Electronic Signature(s) Signed: 08/20/2017 4:27:21 PM By: Gretta Cool, BSN, RN, CWS, Kim RN, BSN Signed: 08/20/2017 4:56:29 PM By: Linton Ham MD Entered By: Linton Ham on 08/20/2017 09:49:06 [name], [name] ([id number]) -------------------------------------------------------------------------------- HPI Details Patient Name: [name], [name] Date of Service: 08/20/2017 9:15 AM Medical Record Number: [id number] Patient Account Number: [id number] Date of Birth/Sex: [birth year] (77 y.o. Female) Treating RN:  Cornell Barman Primary Care Provider: Beverlyn Roux Other Clinician: Referring Provider: Beverlyn Roux Treating Provider/Extender: Tito Dine in Treatment: 41 History of Present Illness HPI Description: 08/13/16: This is a 77 year old woman who came predominantly for review of 3 cm in diameter circular wound to the left anterior lateral leg. She was in the ER on 08/01/16 I reviewed their notes. There was apparently pus coming out of the wound at that time and the patient arrived requesting debridement which they don't do in the emergency room. Nevertheless I can't see that they did any x-rays. There were no cultures done. She is a type II diabetic and I a note after the patient was in the clinic that she had a bypass graft from the popliteal to the tibial on the right on 02/28/16. She also had a right greater saphenous vein harvest on the same date for arterial bypass. She is going to have vascular studies including ABIs T ABIs on the right on 08/28/16. The patient's surgery was on 02/28/16 by Dr. Vallarie Mare she had a right below the knee popliteal artery to peroneal artery bypass with reverse greater saphenous vein and an endarterectomy of the mid segment peroneal artery. Postoperatively she had a strong mild monophasic peroneal signal with a pink foot. It would appear that the patient is had some nonhealing in the surgical saphenous vein harvest site on the left leg. Surprisingly looking through [facility] I cannot see much information about this at all. Dr. Lucious Groves notes from 05/29/16 show that the patient's wounds "are not healed" the right first metatarsal wound healed but then opened back up. 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 At that point the patient had a wound VAC to her right calf with regards to the wounds on her dorsal right toe would appear that these are felt to be  arterial wounds. She has had surgery on the metatarsal phalangeal in  2015 I Dr. Doran Durand secondary to a right metatarsal phalangeal joint fracture. She is apparently had discoloration around this area since then. 08/28/16; patient arrives with her wounds in much the same condition. The linear vein harvest site and the circular wound below it which I think was a blister. She also has to probing holes in her right great toe and a necrotic eschar on the right second toe. Because of these being arterial wounds I reduced her compression from 3-2 layers this seems to of done satisfactorily she has not had any problems. I cannot see that she is actually had an x-ray ====== 11/06/16 the patient comes in for evaluation of her right lower extremity ulcers. She was here in January 2018 for 2 visits subsequently ended up in the hospital with pneumonia and then to rehabilitation. She has now been discharged from rehabilitation and is home. She has multiple ulcerations to her right lower extremity including the foot and toes. She does have home health in place and they have been placing alginate to the ulcers. She is followed by Dr. Bridgett Larsson of vascular medicine. 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 She recently saw him on 3/23. In office ABIs were: Right 0.48 with monophasic flow to the DP, PT, peroneal Left 0.63 with monophasic flow to the DP and PT Her arterial studies indicated a patent right below knee popliteal to peroneal bypass She had an MRI in February 2008 that was negative frosty myelitis but this showed general soft tissue edema in the right foot and lower extremity concerning for cellulitis She is a diabetic, managed with insulin. Her hemoglobin A1c in December 2017 was 8.4 which is a trend up from previous levels. She had blood work in February 2018 which revealed an albumin of 2.6 this appears to be relatively acute as  an albumin in November 2017 was 3.7 11/13/16; this is a patient I have not seen since February who is readmitted to our clinic last week. 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 Bridgett Larsson. I have reviewed Dr. Lianne Moris notes from March/23/18. Doppler ABI on that date showed an ABI on the right of 0.48 and on the left of 0.63. Dorsalis pedis waveforms were monophasic bilaterally. There was no waveforms detected at the posterior tibial on the right, monophasic on the left. Dr. Lianne Moris comments were that this patient would have follow-up vascular studies in 3 months including ABIs and right lower extremity arterial duplex. She had an MRI in February that was negative for osteomyelitis but showed generalized edema in the foot. Last albumin I see was in January at 3.4 we have been using Santyl to the 4 wounds on the right leg Washko, Rome V. ([id number]) the patient is noted today to have widespread edema well up towards her groin this is pitting 2-3+. 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 Normal ejection fraction. 11/20/16; patient has a follow-up appointment with Dr. Bridgett Larsson on April 23. She is still complaining of a lot of pain in the right foot and right leg. It is not clear to me that this is at all positional however I think it is clear claudication with minimal activity perhaps at rest. At our suggestion she is return to her primary physician's office tomorrow with regards to her pitting lower extremity bilateral edema  that I reviewed in detail last week 11/27/16; the follow-up with Dr. Bridgett Larsson was actually on May 23 on April 23 as I stated in my note last week. N/A case all of her wounds seems somewhat smaller. The 2 on the right leg are definitely smaller. The areas on the dorsal right first toe, right third toe and the lateral part of the right fifth metatarsal head all looks  smaller but have tightly adherent surfaces. We have been using Santyl 12/04/16; follow-up with Dr. Bridgett Larsson on May 23. 2 small open wounds on the right leg continue to get smaller. The area on the right third total lateral aspect of the right fifth toe also look better. The remaining area on the dorsal first toe still has some depth to it. We have been using Santyl to the toes and collagen on the right 12/11/16; according to patient's husband the follow-up with Dr. Bridgett Larsson is not until July. All of the wounds on the right leg are measuring smaller. We have been using some combination of Prisma and Santyl although I think we can go to straight Prisma today. There may have been some confusion with home health about the primary dressing orders here. 12/25/16; the patient has had some healing this week. The area on her right lateral fifth metatarsal head, right third toe are both healed lower right leg is healed. In the vein harvest site superiorly she has one superficial open area. On the dorsal aspect of her right great toe/MTP joint the wound is now divided into 2 however the proximal area is deep and there is palpable bone 01/01/17; still an open area in the middle of her original right surgical scar. The area on the right third toe and right lateral fifth metatarsal head remained closed. Problematic area on the dorsal aspect of the toe. Previous surgery in this area line 01/08/17 small open area on the original scar on her upper anterior leg although this is closing. X-ray I did of the right first toe did not show underlying bony abnormality. Still this area on the dorsal first toe probes to bone. We have been using Prisma 01/15/17; small open area in the original scar in the upper anterior leg is almost fully closed. She has 2 open areas over the dorsal aspect of the right first toe that probes to bone. Used and a form starting last week. Vigorous bone scraping that I did last week showed few methicillin  sensitive staph aureus. She is allergic to penicillin and sulfa drugs. I'm going to give her 2 weeks of doxycycline. She will need an MRI with contrast. 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 She continues on doxycycline for a bone scraping that showed methicillin sensitive staph aureus [allergic to penicillin and sulfa]. We have been using Endo form to the wound. 01/29/17; surprisingly her MRI did not show osteomyelitis. She continues on doxycycline for a bone scraping that showed methicillin sensitive staph aureus with allergies to penicillin and sulfa. We have been using Endo form to the wound. Unfortunately I cannot get a surface on this visit looks like it is able to support a healing state. Proximally there is still exposed bone. There is no overt soft tissue tenderness. Her MRI did show a previous fracture was surgery to this area but no hardware 02/12/17 on evaluation today patient appears to be doing well in regard to her lower extremity wound.  She does have some mild discomfort but this is minimal. There's no evidence of infection. Her left great toe nail has somewhat been lifted up and there is a little bit of slight bleeding underneath but this is still firmly attached. 02/19/17; she ran out of doxycycline 2 days ago. Nevertheless I would like to continue this for 2 weeks to make a full 6 weeks of therapy as the bone scraping that I did from the open area on the dorsal right first toe showed MRSA. This should complete antibiotic therapy. 02/26/17; the patient is completing her 6 weeks of oral doxycycline. Deep wound on the dorsal right toe. This still has some exposed bone proximally. She does have a reasonably granulated surface albeit thin over bone. I've been using Endo form have made application for an amniotic skin sub 03/05/17; the patient has completed 6 weeks of doxycycline. This is a deep wound on the  dorsal right toe which has exposed bone proximally. She has granulation over most of this wound albeit a thin layer. I've been using Endo form. Still do not have approval for Affinity 03/13/17 on evaluation today patient's right foot wound appears to be doing better measurement wise compared to her last evaluation. She has been tolerating the dressing change without complication and does have a appointment with the dietary that has been made as far as referral is concerned there just waiting for her and transportation to contact them back for an actual date. Nonetheless patient has been having less pain at this point. No fevers, chills, nausea, or vomiting noted at this time. 03/26/17; linear wound over the dorsal aspect of her right great toe. At one point this had exposed bone on the most superior aspect however I am pleased to see today that this appears to have a surface of granulation. Apparently product was applied for but denied by [facility]. We'll need to see what that was. The patient has known PAD status post revascularization. MRI did not show osteomyelitis which was done in June 04/02/17; continued improvement using Endoform. She was approved for Oasis but hasn't over $250 co-pay per application, this is beyond her means Andrus, Shavana V. ([id number]) 04/09/17; continues on Endoform. 04/16/17; appears to be doing nicely continuing on Endoform 04/22/17; patient did not have her dressing changed all week because of the weather. Using Endoform. Base of the wound looks healthy elbow there appears to events surrounding maceration perhaps because of the drainage was not changing the wound dressing 04/30/17 wound today continues to close and except for a small divot on the superior part of the wound. This area did not probe the bone but I found this a little concerning as this was the area with exposed bone before we be able to get this to granulate forward. As I remember things she is not a candidate  for skin substitutes secondary to an outlandish co-pay. I asked her husband to look into the out of pocket max for medications if he has 1 [Regranex] or totally for skin substitutes 05/07/17; the patient arrives today with a wound roughly the same size although under careful inspection under the light there appears to be some epithelialization. Her intake nurse noted that the Endoform seemed to be placed over the wound rather than in the deeper divots they could really benefit from the Endoform. At this point I have no plans to change the Endoform as at one point this was at least 33% exposed bone and the rest of the wound very close  to the underlying phalanx 05/14/17; no major change in the size or appearance of the wound. We have been using Endoform for a prolonged period of time with reasonably good improvement in the epithelialization i.e. no exposed bone especially proximally. Unless we've not made a lot of changes in the last several weeks. She does not complain of pain. She was revascularized early this year or perhaps late last year by Dr. Bridgett Larsson and I wonder if he will need to have a look at her, I will need to review the actual vascular procedure which I think was a distal popliteal bypass 05/21/17; switched to either for a blue last week. Patient arrived in clinic with the wound not measuring any better but the surface looking some better. Unfortunately she had some surface slough and when I went to remove this superiorly she had exposed bone. Previously she had had exposed bone in the inferior part of the wound however this is granulated over some weeks ago. Clearly this is a major step back for her. With some difficulty I was able to obtain a piece of the bone probing through the superior part of the wound for culture. We did not send pathology. It is likely that she is going to need imaging of this site but I did not order this today 05/28/17; bone culture I took last week showed MSSA.  She still has exposed bone however I could not get another piece for pathology. She had an MRI 4 months ago that did not show osteoarthritic at time. Wound rapidly deteriorated superiorly and I suspect this is underlying osteomyelitis. We have been using Hydrofera Blue 06/04/17 on evaluation today patient's wound appears to actually be doing a little bit better visually compared to last week's evaluation which is good news. Nonetheless she does have osteomyelitis of the toe which does not appear to be MRSA. No fevers, chills, nausea, or vomiting noted at this time. She is having no discomfort. 06/11/17; patient's wound looks a little healthier than I remember seeing it 2 weeks ago. There is no exposed bone and the surface of the wound appears well granulated. We've been using hydrofera blue. I note that she was started on doxycycline which was MSSA. Her appointment with Dr. Ola Spurr is on November 12 I believe. 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 Vallarie Mare of vascular surgery on 11/9. He offered right leg angiography possible intervention however the patient refused. I've discussed this with the patient's husband today she did not want any further procedures. Also noteworthy that Dr. Vallarie Mare stated that this wound had not healed in 3 years, I was not aware of this degree of chronicity in this area. She has underlying osteomyelitis by bone culture. Culture of this grew MSSA, she is allergic to penicillin and therefore not a candidate for beta lactams. She saw Dr. Ola Spurr of infectious disease this morning, he continued her on doxycycline for another month and apparently sent in a prescription. We have been using Hydrofera Blue. ABI's update by Dr. Bridgett Larsson right 0.62, left 0.89. Monophasic wave forms 06/25/17 on evaluation today patient appears to be doing well in regard to her right great toe wound. She is still taking the doxycycline as prescribed by Dr. Ola Spurr. The  Hydrofera Blue Dressing's to be doing as well as anything has for her up to this point and we are still waiting on an insurance authorization for the Realitos. She is having no pain which is good news. No fevers, chills, nausea, or  vomiting noted at this time. 07/02/17; still taking doxycycline as prescribed by Dr. Ola Spurr. Hydrofera Blue continues. We have no palpable bone. Still have not had had approval of Apligraf 08/06/16; the patient arrives today with [facility] even though we apparently had changed to Sorbact last time. Her co-pay for Regranex is $389 in discussion with the patient and her husband this was excessive. We'll continue with the Sorbact and standard dressings for now 08/20/17; we have been using sorbact. The wound bed still requires debridement. Wound measurements are slightly better. Electronic Signature(s) Signed: 08/20/2017 4:56:29 PM By: Linton Ham MD Entered By: Linton Ham on 08/20/2017 09:50:02 [name], [name] ([id number]) [name], [name] ([id number]) -------------------------------------------------------------------------------- Physical Exam Details Patient Name: Stahlecker, Jnya V. Date of Service: 08/20/2017 9:15 AM Medical Record Number: [id number] Patient Account Number: [id number] Date of Birth/Sex: [birth year] (77 y.o. Female) Treating RN: Cornell Barman Primary Care Provider: Beverlyn Roux Other Clinician: Referring Provider: Beverlyn Roux Treating Provider/Extender: Tito Dine in Treatment: 41 Constitutional Sitting or standing Blood Pressure is within target range for patient.. Pulse regular and within target range for patient.[name] Respirations regular, non-labored and within target range.. Temperature is normal and within the target range for the patient.[name] appears in no distress. Cardiovascular Pedal pulses absent. Notes Wound exam; the patient's wound is on the dorsal toe. There is no exposed bone. There is a significant amount of  necrotic debris over the wound surface and 80s and #3 curet to remove this. She has viable granulation at the base of the wound post-debridement with no evidence of infection. Electronic Signature(s) Signed: 08/20/2017 4:56:29 PM By: Linton Ham MD Entered By: Linton Ham on 08/20/2017 09:51:39 [name], [name] ([id number]) -------------------------------------------------------------------------------- Physician Orders Details Patient Name: Czerniak, Ashtan V. Date of Service: 08/20/2017 9:15 AM Medical Record Number: [id number] Patient Account Number: [id number] Date of Birth/Sex: [birth year] (77 y.o. Female) Treating RN: Cornell Barman Primary Care Provider: Beverlyn Roux Other Clinician: Referring Provider: Beverlyn Roux Treating Provider/Extender: Tito Dine in Treatment: 64 Verbal / Phone Orders: No Diagnosis Coding Wound Cleansing Wound #7 Right,Dorsal Metatarsal head first o Clean wound with Normal Saline. o Cleanse wound with mild soap and water Anesthetic (add to Medication List) Wound #7 Right,Dorsal Metatarsal head first o Topical Lidocaine 4% cream applied to wound bed prior to debridement (In Clinic Only). Primary Wound Dressing Wound #7 Right,Dorsal Metatarsal head first o Other: - Sorbact Secondary Dressing Wound #7 Right,Dorsal Metatarsal head first o ABD pad o Conform/Kerlix Dressing Change Frequency Wound #7 Right,Dorsal Metatarsal head first o Change Dressing Monday, Wednesday, Friday Follow-up Appointments Wound #7 Right,Dorsal Metatarsal head first o Return Appointment in 2 weeks. Edema Control Wound #7 Right,Dorsal Metatarsal head first o Elevate legs to the level of the heart and pump ankles as often as possible Additional Orders / Instructions Wound #7 Right,Dorsal Metatarsal head first o Increase protein intake. o Activity as tolerated Home Health Wound #7 Right,Dorsal Metatarsal head first o Continue Home Health  Visits - [facility] weekly, [facility]-Wednesday o Home Health Nurse may visit PRN to address patientos wound care needs. o FACE TO FACE ENCOUNTER: MEDICARE and MEDICAID PATIENTS: I certify that this patient is under my care and that I had a face-to-face encounter that meets the physician face-to-face encounter requirements with this [name], [name] ([id number]) patient on this date. The encounter with the patient was in whole or in part for the following MEDICAL CONDITION: (primary reason for [employer]) MEDICAL NECESSITY: I certify, that based  on my findings, NURSING services are a medically necessary home health service. HOME BOUND STATUS: I certify that my clinical findings support that this patient is homebound (i.e., Due to illness or injury, pt requires aid of supportive devices such as crutches, cane, wheelchairs, walkers, the use of special transportation or the assistance of another person to leave their place of residence. There is a normal inability to leave the home and doing so requires considerable and taxing effort. Other absences are for medical reasons / religious services and are infrequent or of short duration when for other reasons). o If current dressing causes regression in wound condition, may D/C ordered dressing product/s and apply Normal Saline Moist Dressing daily until next [place] / Other MD appointment. [facility] of regression in wound condition at [phone]. o Please direct any NON-WOUND related issues/requests for orders to patient's Primary Care Physician Medications-please add to medication list. Wound #7 Right,Dorsal Metatarsal head first o P.O. Antibiotics - continue antibiotics as prescribed Electronic Signature(s) Signed: 08/20/2017 4:27:21 PM By: Gretta Cool, BSN, RN, CWS, Kim RN, BSN Signed: 08/20/2017 4:56:29 PM By: Linton Ham MD Entered By: Gretta Cool, BSN, RN, CWS, Kim on 08/20/2017 09:48:42 [name],  [name] ([id number]) -------------------------------------------------------------------------------- Problem List Details Patient Name: Cardosa, Shatoya V. Date of Service: 08/20/2017 9:15 AM Medical Record Number: [id number] Patient Account Number: [id number] Date of Birth/Sex: May 04, 1941 (77 y.o. 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 ([id number]) -------------------------------------------------------------------------------- Progress Note Details Patient Name: Maser, Kelena V. Date of Service: 08/20/2017 9:15 AM Medical Record Number: [id number] Patient Account Number: [id number] Date of Birth/Sex: May 11, 1941 (77 y.o. Female) Treating RN: Cornell Barman Primary Care Provider: Beverlyn Roux Other Clinician: Referring Provider: Beverlyn Roux Treating Provider/Extender: Tito Dine in Treatment:  41 Subjective History of Present Illness (HPI) 08/13/16: This is a 77 year old woman who came predominantly for review of 3 cm in diameter circular wound to the left anterior lateral leg. She was in the ER on 08/01/16 I reviewed their notes. There was apparently pus coming out of the wound at that time and the patient arrived requesting debridement which they don't do in the emergency room. Nevertheless I can't see that they did any x-rays. There were no cultures done. She is a type II diabetic and I a note after the patient was in the clinic that she had a bypass graft from the popliteal to the tibial on the right on 02/28/16. She also had a right greater saphenous vein harvest on the same date for arterial bypass. She is going to have vascular studies including ABIs T ABIs on the right  on 08/28/16. The patient's surgery was on 02/28/16 by Dr. Vallarie Mare she had a right below the knee popliteal artery to peroneal artery bypass with reverse greater saphenous vein and an endarterectomy of the mid segment peroneal artery. Postoperatively she had a strong mild monophasic peroneal signal with a pink foot. It would appear that the patient is had some nonhealing in the surgical saphenous vein harvest site on the left leg. Surprisingly looking through [facility] I cannot see much information about this at all. Dr. Lucious Groves notes from 05/29/16 show that the patient's wounds "are not healed" the right first metatarsal wound healed but then opened back up. 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 At that point the patient had a wound VAC to her right calf with regards to the wounds on her dorsal right toe would appear that these are felt to be arterial wounds. She has had surgery on the metatarsal phalangeal in 2015 I Dr. Doran Durand secondary to a right metatarsal phalangeal joint fracture. She is apparently had  discoloration around this area since then. 08/28/16; patient arrives with her wounds in much the same condition. The linear vein harvest site and the circular wound below it which I think was a blister. She also has to probing holes in her right great toe and a necrotic eschar on the right second toe. Because of these being arterial wounds I reduced her compression from 3-2 layers this seems to of done satisfactorily she has not had any problems. I cannot see that she is actually had an x-ray ====== 11/06/16 the patient comes in for evaluation of her right lower extremity ulcers. She was here in January 2018 for 2 visits subsequently ended up in the hospital with pneumonia and then to rehabilitation. She has now been discharged from rehabilitation and is home. She has multiple ulcerations to her right lower extremity including the foot and toes. She does have home health in place and they have been placing alginate to the ulcers. She is followed by Dr. Bridgett Larsson of vascular medicine. 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 She recently saw him on 3/23. In office ABIs were: Right 0.48 with monophasic flow to the DP, PT, peroneal Left 0.63 with monophasic flow to the DP and PT Her arterial studies indicated a patent right below knee popliteal to peroneal bypass She had an MRI in February 2008 that was negative frosty myelitis but this showed general soft tissue edema in the right foot and lower extremity concerning for cellulitis She is a diabetic, managed with insulin. Her hemoglobin A1c in December 2017 was 8.4 which is a trend up from previous levels. She had blood work in February 2018 which revealed an albumin of 2.6 this appears to be relatively acute as an albumin in November 2017 was 3.7 11/13/16; this is a patient I have not seen since February who is readmitted to our clinic last week. She is a type II diabetic on insulin  with known severe PAD status post revascularization in the left leg by Dr. Bridgett Larsson. I have reviewed Dr. Lianne Moris notes from March/23/18. Doppler ABI on that date showed an ABI on the right of 0.48 and on the left of 0.63. Dorsalis pedis waveforms were monophasic bilaterally. There was no waveforms detected at the posterior tibial on the right, monophasic on the left. Dr. Lianne Moris  comments were that this patient would have follow-up vascular studies in 3 months including ABIs and right lower extremity arterial duplex. She had an MRI in February that was negative for osteomyelitis but showed generalized edema in [name], [name] ([id number]) the foot. Last albumin I see was in January at 3.4 we have been using Santyl to the 4 wounds on the right leg the patient is noted today to have widespread edema well up towards her groin this is pitting 2-3+. 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 Normal ejection fraction. 11/20/16; patient has a follow-up appointment with Dr. Bridgett Larsson on April 23. She is still complaining of a lot of pain in the right foot and right leg. It is not clear to me that this is at all positional however I think it is clear claudication with minimal activity perhaps at rest. At our suggestion she is return to her primary physician's office tomorrow with regards to her pitting lower extremity bilateral edema that I reviewed in detail last week 11/27/16; the follow-up with Dr. Bridgett Larsson was actually on May 23 on April 23 as I stated in my note last week. N/A case all of her wounds seems somewhat smaller. The 2 on the right leg are definitely smaller. The areas on the dorsal right first toe, right third toe and the lateral part of the right fifth metatarsal head all looks smaller but have tightly adherent surfaces. We have been using Santyl 12/04/16; follow-up with Dr. Bridgett Larsson on May 23. 2 small open wounds on the right leg continue to get smaller.  The area on the right third total lateral aspect of the right fifth toe also look better. The remaining area on the dorsal first toe still has some depth to it. We have been using Santyl to the toes and collagen on the right 12/11/16; according to patient's husband the follow-up with Dr. Bridgett Larsson is not until July. All of the wounds on the right leg are measuring smaller. We have been using some combination of Prisma and Santyl although I think we can go to straight Prisma today. There may have been some confusion with home health about the primary dressing orders here. 12/25/16; the patient has had some healing this week. The area on her right lateral fifth metatarsal head, right third toe are both healed lower right leg is healed. In the vein harvest site superiorly she has one superficial open area. On the dorsal aspect of her right great toe/MTP joint the wound is now divided into 2 however the proximal area is deep and there is palpable bone 01/01/17; still an open area in the middle of her original right surgical scar. The area on the right third toe and right lateral fifth metatarsal head remained closed. Problematic area on the dorsal aspect of the toe. Previous surgery in this area line 01/08/17 small open area on the original scar on her upper anterior leg although this is closing. X-ray I did of the right first toe did not show underlying bony abnormality. Still this area on the dorsal first toe probes to bone. We have been using Prisma 01/15/17; small open area in the original scar in the upper anterior leg is almost fully closed. She has 2 open areas over the dorsal aspect of the right first toe that probes to bone. Used and a form starting last week. Vigorous bone scraping that I did last week showed few methicillin sensitive staph aureus. She is allergic  to penicillin and sulfa drugs. I'm going to give her 2 weeks of doxycycline. She will need an MRI with contrast. 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 She continues on doxycycline for a bone scraping that showed methicillin sensitive staph aureus [allergic to penicillin and sulfa]. We have been using Endo form to the wound. 01/29/17; surprisingly her MRI did not show osteomyelitis. She continues on doxycycline for a bone scraping that showed methicillin sensitive staph aureus with allergies to penicillin and sulfa. We have been using Endo form to the wound. Unfortunately I cannot get a surface on this visit looks like it is able to support a healing state. Proximally there is still exposed bone. There is no overt soft tissue tenderness. Her MRI did show a previous fracture was surgery to this area but no hardware 02/12/17 on evaluation today patient appears to be doing well in regard to her lower extremity wound. She does have some mild discomfort but this is minimal. There's no evidence of infection. Her left great toe nail has somewhat been lifted up and there is a little bit of slight bleeding underneath but this is still firmly attached. 02/19/17; she ran out of doxycycline 2 days ago. Nevertheless I would like to continue this for 2 weeks to make a full 6 weeks of therapy as the bone scraping that I did from the open area on the dorsal right first toe showed MRSA. This should complete antibiotic therapy. 02/26/17; the patient is completing her 6 weeks of oral doxycycline. Deep wound on the dorsal right toe. This still has some exposed bone proximally. She does have a reasonably granulated surface albeit thin over bone. I've been using Endo form have made application for an amniotic skin sub 03/05/17; the patient has completed 6 weeks of doxycycline. This is a deep wound on the dorsal right toe which has exposed bone proximally. She has granulation over most of this wound albeit a thin layer. I've been using Endo form. Still do not have approval for  [employer] 03/13/17 on evaluation today patient's right foot wound appears to be doing better measurement wise compared to her last evaluation. She has been tolerating the dressing change without complication and does have a appointment with the dietary that has been made as far as referral is concerned there just waiting for her and transportation to contact them back for an actual date. Nonetheless patient has been having less pain at this point. No fevers, chills, nausea, or vomiting noted at this time. 03/26/17; linear wound over the dorsal aspect of her right great toe. At one point this had exposed bone on the most superior aspect however I am pleased to see today that this appears to have a surface of granulation. Apparently product was applied for but denied by [facility]. We'll need to see what that was. The patient has known PAD status post revascularization. MRI did not show osteomyelitis which was done in June 04/02/17; continued improvement using Endoform. She was approved for [employer] but hasn't over $413 co-pay per application, [name], [name] ([id number]) this is beyond her means 04/09/17; continues on Endoform. 04/16/17; appears to be doing nicely continuing on Endoform 04/22/17; patient did not have her dressing changed all week because of the weather. Using Endoform. Base of the wound looks healthy elbow there appears to events surrounding maceration perhaps because of the drainage was not changing the wound dressing  04/30/17 wound today continues to close and except for a small divot on the superior part of the wound. This area did not probe the bone but I found this a little concerning as this was the area with exposed bone before we be able to get this to granulate forward. As I remember things she is not a candidate for skin substitutes secondary to an outlandish co-pay. I asked her husband to look into the out of pocket max for medications if he has 1 [Regranex] or totally for skin  substitutes 05/07/17; the patient arrives today with a wound roughly the same size although under careful inspection under the light there appears to be some epithelialization. Her intake nurse noted that the Endoform seemed to be placed over the wound rather than in the deeper divots they could really benefit from the Endoform. At this point I have no plans to change the Endoform as at one point this was at least 33% exposed bone and the rest of the wound very close to the underlying phalanx 05/14/17; no major change in the size or appearance of the wound. We have been using Endoform for a prolonged period of time with reasonably good improvement in the epithelialization i.e. no exposed bone especially proximally. Unless we've not made a lot of changes in the last several weeks. She does not complain of pain. She was revascularized early this year or perhaps late last year by Dr. Bridgett Larsson and I wonder if he will need to have a look at her, I will need to review the actual vascular procedure which I think was a distal popliteal bypass 05/21/17; switched to either for a blue last week. Patient arrived in clinic with the wound not measuring any better but the surface looking some better. Unfortunately she had some surface slough and when I went to remove this superiorly she had exposed bone. Previously she had had exposed bone in the inferior part of the wound however this is granulated over some weeks ago. Clearly this is a major step back for her. With some difficulty I was able to obtain a piece of the bone probing through the superior part of the wound for culture. We did not send pathology. It is likely that she is going to need imaging of this site but I did not order this today 05/28/17; bone culture I took last week showed MSSA. She still has exposed bone however I could not get another piece for pathology. She had an MRI 4 months ago that did not show osteoarthritic at time. Wound rapidly  deteriorated superiorly and I suspect this is underlying osteomyelitis. We have been using Hydrofera Blue 06/04/17 on evaluation today patient's wound appears to actually be doing a little bit better visually compared to last week's evaluation which is good news. Nonetheless she does have osteomyelitis of the toe which does not appear to be MRSA. No fevers, chills, nausea, or vomiting noted at this time. She is having no discomfort. 06/11/17; patient's wound looks a little healthier than I remember seeing it 2 weeks ago. There is no exposed bone and the surface of the wound appears well granulated. We've been using hydrofera blue. I note that she was started on doxycycline which was MSSA. Her appointment with Dr. Ola Spurr is on November 12 I believe. 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 Vallarie Mare of vascular surgery on 11/9. He offered right leg angiography possible intervention however the patient refused. I've discussed this with the patient's husband  today she did not want any further procedures. Also noteworthy that Dr. Vallarie Mare stated that this wound had not healed in 3 years, I was not aware of this degree of chronicity in this area. She has underlying osteomyelitis by bone culture. Culture of this grew MSSA, she is allergic to penicillin and therefore not a candidate for beta lactams. She saw Dr. Ola Spurr of infectious disease this morning, he continued her on doxycycline for another month and apparently sent in a prescription. We have been using Hydrofera Blue. ABI's update by Dr. Bridgett Larsson right 0.62, left 0.89. Monophasic wave forms 06/25/17 on evaluation today patient appears to be doing well in regard to her right great toe wound. She is still taking the doxycycline as prescribed by Dr. Ola Spurr. The Hydrofera Blue Dressing's to be doing as well as anything has for her up to this point and we are still waiting on an insurance authorization for the Pana. She  is having no pain which is good news. No fevers, chills, nausea, or vomiting noted at this time. 07/02/17; still taking doxycycline as prescribed by Dr. Ola Spurr. Hydrofera Blue continues. We have no palpable bone. Still have not had had approval of Apligraf 08/06/16; the patient arrives today with [facility] even though we apparently had changed to Sorbact last time. Her co-pay for Regranex is $389 in discussion with the patient and her husband this was excessive. We'll continue with the Sorbact and standard dressings for now 08/20/17; we have been using sorbact. The wound bed still requires debridement. Wound measurements are slightly better. [name], [name] ([id number]) Objective Constitutional Sitting or standing Blood Pressure is within target range for patient.. Pulse regular and within target range for patient.[name] Respirations regular, non-labored and within target range.. Temperature is normal and within the target range for the patient.[name] appears in no distress. Vitals Time Taken: 9:17 AM, Height: 64 in, Weight: 200 lbs, BMI: 34.3, Temperature: 97.7 F, Pulse: 79 bpm, Respiratory Rate: 16 breaths/min, Blood Pressure: 138/44 mmHg. General Notes: patient feels good today Cardiovascular Pedal pulses absent. General Notes: Wound exam; the patient's wound is on the dorsal toe. There is no exposed bone. There is a significant amount of necrotic debris over the wound surface and 80s and #3 curet to remove this. She has viable granulation at the base of the wound post-debridement with no evidence of infection. Integumentary (Hair, Skin) Wound #7 status is Open. Original cause of wound was Gradually Appeared. The wound is located on the Right,Dorsal Metatarsal head first. The wound measures 1.6cm length x 0.5cm width x 0.3cm depth; 0.628cm^2 area and 0.188cm^3 volume. There is Fat Layer (Subcutaneous Tissue) Exposed exposed. There is no tunneling or undermining noted. There is a medium  amount of serous drainage noted. The wound margin is flat and intact. There is small (1-33%) red granulation within the wound bed. There is a large (67-100%) amount of necrotic tissue within the wound bed including Adherent Slough. The periwound skin appearance exhibited: Induration, Hemosiderin Staining. The periwound skin appearance did not exhibit: Callus, Crepitus, Excoriation, Rash, Scarring, Dry/Scaly, Maceration, Atrophie Blanche, Cyanosis, Ecchymosis, Mottled, Pallor, Rubor, Erythema. The periwound has tenderness on palpation. 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 ([id number]) Pre-procedure diagnosis of Wound #7 is a Diabetic Wound/Ulcer of the Lower Extremity located on the Right,Dorsal Metatarsal head first .Severity of Tissue Pre Debridement is: Fat layer exposed. There was a Skin/Subcutaneous Tissue Debridement ([id number]) debridement with total area of 0.8 sq cm performed by Ricard Dillon, MD. with the following instrument(s): Curette to remove Viable and Non-Viable tissue/material including Exudate and Subcutaneous after achieving pain control using Other (lidocaine 4%). A time out was conducted at 09:34, prior to the start of the procedure. A Minimum amount of bleeding was controlled with Pressure. The procedure was tolerated well with a pain level  of 0 throughout and a pain level of 0 following the procedure. Post Debridement Measurements: 1.6cm length x 0.5cm width x 0.4cm depth; 0.251cm^3 volume. Character of Wound/Ulcer Post Debridement requires further debridement. Severity of Tissue Post Debridement is: Fat layer exposed. Post procedure Diagnosis Wound #7: Same as Pre-Procedure Plan Wound Cleansing: Wound #7 Right,Dorsal Metatarsal head first: Clean wound with Normal Saline. Cleanse wound with mild soap and water Anesthetic (add to Medication List): Wound #7 Right,Dorsal Metatarsal head first: Topical Lidocaine 4% cream applied to wound bed prior to debridement (In Clinic Only). Primary Wound Dressing: Wound #7 Right,Dorsal Metatarsal head first: Other: - Sorbact Secondary Dressing: Wound #7 Right,Dorsal Metatarsal head first: ABD pad Conform/Kerlix Dressing Change Frequency: Wound #7 Right,Dorsal Metatarsal head first: Change Dressing Monday, Wednesday, Friday Follow-up Appointments: Wound #7 Right,Dorsal Metatarsal head first: Return Appointment in 2 weeks. Edema Control: Wound #7 Right,Dorsal Metatarsal head first: Elevate legs to the level of the heart and pump ankles as often as possible Additional Orders / Instructions: Wound #7 Right,Dorsal Metatarsal head first: Increase protein intake. Activity as tolerated Home Health: Wound #7 Right,Dorsal Metatarsal head first: [facility] [facility] - [facility] twice weekly, [facility] Home Health Nurse may visit PR[facility] to address patient s wound care needs. FACE TO FACE ENCOUNTER: MEDICARE and MEDICAID PATIENTS: I certify that this patient is under my care and that I had a face-to-face encounter that meets the physician face-to-face encounter requirements with this patient on this date. The encounter with the patient was in whole or in part for the following MEDICAL CONDITION: (primary reason for [place]) MEDICAL NECESSITY: I certify, that  based on my findings, NURSING services are a medically necessary home health service. HOME BOUND STATUS: I certify that my clinical findings support that this patient is homebound (i.e., Due to illness or injury, pt requires aid of supportive devices such as crutches, cane, wheelchairs, walkers, the use of special transportation or the assistance of another person to leave their place of residence. There is a normal inability to leave the home and doing so requires considerable and taxing effort. Other absences are for medical reasons / religious services and are infrequent or of short duration when for other reasons). [name], [name] ([id number]) If current dressing causes regression in wound condition, may D/C ordered dressing product/s and apply Normal Saline Moist Dressing daily until next [place] / Other MD appointment. [place] of regression in wound condition at [phone]. Please direct any NON-WOUND related issues/requests for orders to patient's Primary Care Physician Medications-please add to medication list.: Wound #7 Right,Dorsal Metatarsal head first: P.O. Antibiotics -  continue antibiotics as prescribed #1 wound bed looks quite good post debridement #2 we will continue his sorbact, dimensions are better there is no exposed bone #3 patient is not a candidate for any advanced treatment options because of insurance co-pay. Cost of her chronic was well over $300 #4 multifactorial wound including arterial insufficiency, previous surgery in the area etc. Electronic Signature(s) Signed: 08/20/2017 4:56:29 PM By: Linton Ham MD Entered By: Linton Ham on 08/20/2017 09:53:02 Oren, Kiwana V. ([id number]) -------------------------------------------------------------------------------- SuperBill Details Patient Name: Pine, Christol V. Date of Service: 08/20/2017 Medical Record Number: [id number] Patient Account Number: [id number] Date of Birth/Sex:  02-25-1941 (77 y.o. 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 diabetic peripheral angiopathy with gangrene I70.232 Atherosclerosis 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 of calf I70.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, right ankle and foot Facility Procedures CPT4 Code Description: 93790240 11042 - DEB SUBQ TISSUE 20 SQ CM/< ICD-10 Diagnosis Description 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

## 2017-09-03 ENCOUNTER — Encounter: Payer: Medicare HMO | Admitting: Internal Medicine

## 2017-09-03 DIAGNOSIS — E11622 Type 2 diabetes mellitus with other skin ulcer: Secondary | ICD-10-CM | POA: Diagnosis not present

## 2017-09-04 NOTE — Progress Notes (Signed)
[name], [name] ([id number]) Visit Report for 09/03/2017 Arrival Information Details Patient Name: [name], [name] Date of Service: 09/03/2017 10:15 AM Medical Record Number: [id number] Patient Account Number: [id number] Date of Birth/Sex: [birth year] (77 y.o. Female) Treating RN: [name] Primary Care Kaysin Brock: [name] Other Clinician: Referring [name]: [name] Treating [name]/Extender: [name] in Treatment: 58 Visit Information History Since Last Visit All ordered tests and consults were completed: No Patient Arrived: Ambulatory Added or deleted any medications: No Arrival Time: 10:23 Any new allergies or adverse reactions: No Accompanied By: husband Had a fall or experienced change in No Transfer Assistance: None activities of daily living that may affect Patient Identification Verified: Yes risk of falls: Secondary Verification Process Completed: Yes Signs or symptoms of abuse/neglect since last visito No Patient Requires Transmission-Based No Hospitalized since last visit: No Precautions: Pain Present Now: No Patient Has Alerts: No Electronic Signature(s) Signed: 09/03/2017 11:42:03 AM By: [name] Entered By: [name] on 09/03/2017 10:24:06 [name], [name] [name] ([id number]) -------------------------------------------------------------------------------- Clinic Level of Care Assessment Details Patient Name: [name], [name] Date of Service: 09/03/2017 10:15 AM Medical Record Number: [id number] Patient Account Number: [id number] Date of Birth/Sex: 18-May-1941 (77 y.o. Female) Treating RN: [name] Primary Care [name]: [name] Other Clinician: Referring [name]: [name] Treating [name]/Extender: [name] in Treatment: 47 Clinic Level of Care Assessment Items TOOL 4 Quantity Score []  - Use when only an EandM is performed on FOLLOW-UP visit 0 ASSESSMENTS - Nursing Assessment / Reassessment []  -  Reassessment of Co-morbidities (includes updates in patient status) 0 X- 1 5 Reassessment of Adherence to Treatment Plan ASSESSMENTS - Wound and Skin Assessment / Reassessment X - Simple Wound Assessment / Reassessment - one wound 1 5 []  - 0 Complex Wound Assessment / Reassessment - multiple wounds []  - 0 Dermatologic / Skin Assessment (not related to wound area) ASSESSMENTS - Focused Assessment []  - Circumferential Edema Measurements - multi extremities 0 []  - 0 Nutritional Assessment / Counseling / Intervention []  - 0 Lower Extremity Assessment (monofilament, tuning fork, pulses) []  - 0 Peripheral Arterial Disease Assessment (using hand held doppler) ASSESSMENTS - Ostomy and/or Continence Assessment and Care []  - Incontinence Assessment and Management 0 []  - 0 Ostomy Care Assessment and Management (repouching, etc.) PROCESS - Coordination of Care X - Simple Patient / Family Education for ongoing care 1 15 []  - 0 Complex (extensive) Patient / Family Education for ongoing care []  - 0 Staff obtains Programmer, systems, Records, Test Results / Process Orders []  - 0 Staff telephones HHA, Nursing Homes / Clarify orders / etc []  - 0 Routine Transfer to another Facility (non-emergent condition) []  - 0 Routine Hospital Admission (non-emergent condition) []  - 0 New Admissions / Biomedical engineer / Ordering NPWT, Apligraf, etc. []  - 0 Emergency Hospital Admission (emergent condition) X- 1 10 Simple Discharge Coordination [name], [name] ([id number]) []  - 0 Complex (extensive) Discharge Coordination PROCESS - Special Needs []  - Pediatric / Minor Patient Management 0 []  - 0 Isolation Patient Management []  - 0 Hearing / Language / Visual special needs []  - 0 Assessment of Community assistance (transportation, D/C planning, etc.) []  - 0 Additional assistance / Altered mentation []  - 0 Support Surface(s) Assessment (bed, cushion, seat, etc.) INTERVENTIONS - Wound Cleansing /  Measurement X - Simple Wound Cleansing - one wound 1 5 []  - 0 Complex Wound Cleansing - multiple wounds X- 1 5 Wound Imaging (photographs - any number of wounds) []  - 0 Wound  Tracing (instead of photographs) X- 1 5 Simple Wound Measurement - one wound []  - 0 Complex Wound Measurement - multiple wounds INTERVENTIONS - Wound Dressings X - Small Wound Dressing one or multiple wounds 1 10 []  - 0 Medium Wound Dressing one or multiple wounds []  - 0 Large Wound Dressing one or multiple wounds []  - 0 Application of Medications - topical []  - 0 Application of Medications - injection INTERVENTIONS - Miscellaneous []  - External ear exam 0 []  - 0 Specimen Collection (cultures, biopsies, blood, body fluids, etc.) []  - 0 Specimen(s) / Culture(s) sent or taken to Lab for analysis []  - 0 Patient Transfer (multiple staff / Civil Service fast streamer / Similar devices) []  - 0 Simple Staple / Suture removal (25 or less) []  - 0 Complex Staple / Suture removal (26 or more) []  - 0 Hypo / Hyperglycemic Management (close monitor of Blood Glucose) []  - 0 Ankle / Brachial Index (ABI) - do not check if billed separately X- 1 5 Vital Signs [name], [name] ([id number]) Has the patient been seen at the hospital within the last three years: Yes Total Score: 65 Level Of Care: New/Established - Level 2 Electronic Signature(s) Signed: 09/03/2017 11:42:03 AM By: [name] Entered By: [name] on 09/03/2017 11:01:00 [name], [name] [name] ([id number]) -------------------------------------------------------------------------------- Encounter Discharge Information Details Patient Name: [name], [name] Date of Service: 09/03/2017 10:15 AM Medical Record Number: [id number] Patient Account Number: [id number] Date of Birth/Sex: [birth year] (77 y.o. Female) Treating RN: [name] Primary Care [name]: [name] Other Clinician: Referring [name]: [name] Treating [name]/Extender: [name] in Treatment: 7 Encounter Discharge Information Items Discharge Pain Level: 0 Discharge Condition: Stable Ambulatory Status: Wheelchair Discharge Destination: Home Transportation: Private Auto Accompanied By: husband Schedule Follow-up Appointment: Yes Medication Reconciliation completed and No provided to Patient/Care Wisam Siefring: Provided on Clinical Summary of Care: 09/03/2017 Form Type Recipient Paper Patient [facility] Electronic Signature(s) Signed: 09/03/2017 11:42:03 AM By: [name] Entered By: [name] on 09/03/2017 11:02:32 [name], Crescentia V. ([id number]) -------------------------------------------------------------------------------- Lower Extremity Assessment Details Patient Name: [name], [name] Date of Service: 09/03/2017 10:15 AM Medical Record Number: [id number] Patient Account Number: [id number] Date of Birth/Sex: [birth year] (77 y.o. Female) Treating RN: [name] Primary Care Trent Theisen: [name] Other Clinician: Referring Eythan Jayne: [name] Treating Ossiel Marchio/Extender: [name] in Treatment: 43 Edema Assessment Assessed: [Left: No] [Right: No] Edema: [Left: Ye] [Right: s] Vascular Assessment Claudication: Claudication Assessment [Right:None] Pulses: Dorsalis Pedis Palpable: [Right:Yes] Posterior Tibial Extremity colors, hair growth, and conditions: Extremity Color: [Right:Hyperpigmented] Hair Growth on Extremity: [Right:No] Temperature of Extremity: [Right:Warm] Capillary Refill: [Right:> 3 seconds] Toe Nail Assessment Left: Right: Thick: Yes Discolored: Yes Deformed: Yes Improper Length and Hygiene: Yes Electronic Signature(s) Signed: 09/03/2017 11:42:03 AM By: [name] Entered By: [name] on 09/03/2017 10:32:13 [name], [name] ([id number]) -------------------------------------------------------------------------------- Multi Wound Chart Details Patient Name: [name], [name] Date of Service:  09/03/2017 10:15 AM Medical Record Number: [id number] Patient Account Number: [id number] Date of Birth/Sex: Jan 20, 1941 (77 y.o. Female) Treating RN: [name] Primary Care Athelene Hursey: [name] Other Clinician: Referring Faruq Rosenberger: [name] Treating Mikahla Wisor/Extender: [name] in Treatment: 43 Vital Signs Height(in): 64 Pulse(bpm): 72 Weight(lbs): 200 Blood Pressure(mmHg): 149/51 Body Mass Index(BMI): 34 Temperature(F): 97.6 Respiratory Rate 16 (breaths/min): Photos: [7:No Photos] [N/A:N/A] Wound Location: [7:Right Metatarsal head first - N/A Dorsal] Wounding Event: [7:Gradually Appeared] [N/A:N/A] Primary Etiology: [7:Diabetic Wound/Ulcer of the N/A Lower Extremity] Comorbid History: [7:Cataracts, Chronic sinus problems/congestion, Congestive Heart Failure, Hypertension, Type  II Diabetes] [N/A:N/A] Date Acquired: [7:08/06/2013] [N/A:N/A] Weeks of Treatment: [7:43] [N/A:N/A] Wound Status: [7:Open] [N/A:N/A] Measurements L x W x D [7:1.4x0.5x0.3] [N/A:N/A] (cm) Area (cm) : [7:0.55] [N/A:N/A] Volume (cm) : [7:0.165] [N/A:N/A] % Reduction in Area: [7:53.30%] [N/A:N/A] % Reduction in Volume: [7:53.30%] [N/A:N/A] Classification: [7:Grade 2] [N/A:N/A] Exudate Amount: [7:Medium] [N/A:N/A] Exudate Type: [7:Serous] [N/A:N/A] Exudate Color: [7:amber] [N/A:N/A] Wound Margin: [7:Flat and Intact] [N/A:N/A] Granulation Amount: [7:Medium (34-66%)] [N/A:N/A] Granulation Quality: [7:Red] [N/A:N/A] Necrotic Amount: [7:Medium (34-66%)] [N/A:N/A] Exposed Structures: [7:Fat Layer (Subcutaneous Tissue) Exposed: Yes Fascia: No Tendon: No Muscle: No Joint: No Bone: No] [N/A:N/A] Epithelialization: [7:Small (1-33%)] [N/A:N/A] Periwound Skin Texture: [7:Induration: Yes Excoriation: No] [N/A:N/A] Callus: No Crepitus: No Rash: No Scarring: No Periwound Skin Moisture: Maceration: No N/A N/A Dry/Scaly: No Periwound Skin Color: Hemosiderin Staining: Yes N/A  N/A Atrophie Blanche: No Cyanosis: No Ecchymosis: No Erythema: No Mottled: No Pallor: No Rubor: No Tenderness on Palpation: Yes N/A N/A Wound Preparation: Ulcer Cleansing: N/A N/A Rinsed/Irrigated with Saline Topical Anesthetic Applied: Other: lidocaine 4% Treatment Notes Wound #7 (Right, Dorsal Metatarsal head first) 1. Cleansed with: Clean wound with Normal Saline 2. Anesthetic Topical Lidocaine 4% cream to wound bed prior to debridement 3. Peri-wound Care: Moisturizing lotion Notes sorbact, heel foam and gauze wrap Electronic Signature(s) Signed: 09/03/2017 4:49:25 PM By: Linton Ham MD Entered By: Linton Ham on 09/03/2017 11:53:10 Shank, Honesti [name] ([id number]) -------------------------------------------------------------------------------- Multi-Disciplinary Care Plan Details Patient Name: Portell, [name] Date of Service: 09/03/2017 10:15 AM Medical Record Number: [id number] Patient Account Number: [id number] Date of Birth/Sex: 05-30-1941 (77 y.o. Female) Treating RN: [name] Primary Care Jackston O[place]: [name] Other Clinician: Referring Breahna Boylen: [name] Treating Breckin Savannah/Extender: [name] in Treatment: 48 Active Inactive ` Abuse / Safety / Falls / Self Care Management Nursing Diagnoses: Impaired physical mobility Potential for falls Goals: Patient will remain injury free Date Initiated: 11/06/2016 Target Resolution Date: 12/30/2016 Goal Status: Active Patient/caregiver will verbalize understanding of skin care regimen Date Initiated: 11/06/2016 Target Resolution Date: 12/30/2016 Goal Status: Active Interventions: Assess fall risk on admission and as needed Treatment Activities: Patient referred to home care : 11/06/2016 Notes: ` Nutrition Nursing Diagnoses: Potential for alteratiion in Nutrition/Potential for imbalanced nutrition Goals: Patient/caregiver verbalizes understanding of need to maintain therapeutic glucose  control per primary care physician Date Initiated: 11/06/2016 Target Resolution Date: 12/30/2016 Goal Status: Active Interventions: Provide education on elevated blood sugars and impact on wound healing Notes: ` Orientation to the Wound Care Program Nursing Diagnoses: Knowledge deficit related to the wound healing center program ELYCIA, WOODSIDE ([id number]) Goals: Patient/caregiver will verbalize understanding of the [place] Date Initiated: 11/06/2016 Target Resolution Date: 12/30/2016 Goal Status: Active Interventions: Provide education on orientation to the wound center Notes: ` Venous Leg Ulcer Nursing Diagnoses: Actual venous Insuffiency (use after diagnosis is confirmed) Knowledge deficit related to disease process and management Goals: Non-invasive venous studies are completed as ordered Date Initiated: 11/06/2016 Target Resolution Date: 12/30/2016 Goal Status: Active Patient/caregiver will verbalize understanding of disease process and disease management Date Initiated: 11/06/2016 Target Resolution Date: 12/30/2016 Goal Status: Active Interventions: Assess peripheral edema status every visit. Notes: ` Wound/Skin Impairment Nursing Diagnoses: Impaired tissue integrity Knowledge deficit related to smoking impact on wound healing Knowledge deficit related to ulceration/compromised skin integrity Goals: Ulcer/skin breakdown will heal within 14 weeks Date Initiated: 11/06/2016 Target Resolution Date: 02/05/2017 Goal Status: Active Interventions: Assess ulceration(s) every visit Treatment Activities: Skin care regimen initiated : 11/06/2016 Notes: Electronic Signature(s) Signed: 09/03/2017 11:42:03 AM  By: Denzil Hughes ([id number]) Entered By: [name] on 09/03/2017 10:34:17 Kaseman, Kathryne V. ([id number]) -------------------------------------------------------------------------------- Non-Wound Condition Assessment Details Patient  Name: Nadel, Tanai V. Date of Service: 09/03/2017 10:15 AM Medical Record Number: [id number] Patient Account Number: [id number] Date of Birth/Sex: [birth year] (77 y.o. Female) Treating RN: [name] Primary Care Jajuan Skoog: [name] Other Clinician: Referring Layal Javid: [name] Treating Ysabella Babiarz/Extender: [name] in Treatment: 63 Non-Wound Condition: Condition: Suspected Deep Tissue Injury Location: Other: great toe Side: Right Periwound Skin Texture Texture Color No Abnormalities Noted: No No Abnormalities Noted: No Callus: No Atrophie Blanche: No Crepitus: No Cyanosis: No Excoriation: No Ecchymosis: No Friable: No Erythema: No Induration: No Hemosiderin Staining: No Rash: No Mottled: No Scarring: No Pallor: No Rubor: No Moisture No Abnormalities Noted: No Temperature / Pain Dry / Scaly: No Temperature: No Abnormality Maceration: No Tenderness on Palpation: Yes Notes tip of toe has dark areas, tender to touch and skin is closed slight redness noted. Electronic Signature(s) Signed: 09/03/2017 11:42:03 AM By: [name] Entered By: [name] on 09/03/2017 10:34:04 Hawkes, Kasidee V. ([id number]) -------------------------------------------------------------------------------- Pain Assessment Details Patient Name: Sayre, Brissia V. Date of Service: 09/03/2017 10:15 AM Medical Record Number: [id number] Patient Account Number: [id number] Date of Birth/Sex: July 27, 1941 (77 y.o. Female) Treating RN: [name] Primary Care Margreat Widener: [name] Other Clinician: Referring Hari Casaus: [name] Treating Aniko Finnigan/Extender: [name] in Treatment: 64 Active Problems Location of Pain Severity and Description of Pain Patient Has Paino No Site Locations Pain Management and Medication Current Pain Management: Electronic Signature(s) Signed: 09/03/2017 11:42:03 AM By: [name] Entered By: [name] on  09/03/2017 10:24:13 Charney, Taraji V. ([id number]) -------------------------------------------------------------------------------- Wound Assessment Details Patient Name: Charo, Machell V. Date of Service: 09/03/2017 10:15 AM Medical Record Number: [id number] Patient Account Number: [id number] Date of Birth/Sex: [birth year] (77 y.o. Female) Treating RN: [name] Primary Care Naythen Heikkila: [name] Other Clinician: Referring Channell Quattrone: [name] Treating Diezel Mazur/Extender: [name] in Treatment: 62 Wound Status Wound Number: 7 Primary Diabetic Wound/Ulcer of the Lower Extremity Etiology: Wound Location: Right Metatarsal head first - Dorsal Wound Open Wounding Event: Gradually Appeared Status: Date Acquired: 08/06/2013 Comorbid Cataracts, Chronic sinus problems/congestion, Weeks Of Treatment: 43 History: Congestive Heart Failure, Hypertension, Type II Clustered Wound: No Diabetes Photos Photo Uploaded By: [name] on 09/03/2017 16:17:40 Wound Measurements Length: (cm) 1.4 Width: (cm) 0.5 Depth: (cm) 0.3 Area: (cm) 0.55 Volume: (cm) 0.165 % Reduction in Area: 53.3% % Reduction in Volume: 53.3% Epithelialization: Small (1-33%) Tunneling: No Undermining: No Wound Description Classification: Grade 2 Wound Margin: Flat and Intact Exudate Amount: Medium Exudate Type: Serous Exudate Color: amber Foul Odor After Cleansing: No Slough/Fibrino Yes Wound Bed Granulation Amount: Medium (34-66%) Exposed Structure Granulation Quality: Red Fascia Exposed: No Necrotic Amount: Medium (34-66%) Fat Layer (Subcutaneous Tissue) Exposed: Yes Necrotic Quality: Adherent Slough Tendon Exposed: No Muscle Exposed: No Joint Exposed: No Bone Exposed: No Periwound Skin Texture Nicolini, Shristi V. ([id number]) Texture Color No Abnormalities Noted: No No Abnormalities Noted: No Callus: No Atrophie Blanche: No Crepitus: No Cyanosis: No Excoriation: No Ecchymosis:  No Induration: Yes Erythema: No Rash: No Hemosiderin Staining: Yes Scarring: No Mottled: No Pallor: No Moisture Rubor: No No Abnormalities Noted: No Dry / Scaly: No Temperature / Pain Maceration: No Tenderness on Palpation: Yes Wound Preparation Ulcer Cleansing: Rinsed/Irrigated with Saline Topical Anesthetic Applied: Other: lidocaine 4%, Treatment Notes Wound #7 (Right, Dorsal Metatarsal head first) 1. Cleansed with: Clean wound with Normal Saline 2. Anesthetic  Topical Lidocaine 4% cream to wound bed prior to debridement 3. Peri-wound Care: Moisturizing lotion Notes sorbact, heel foam and gauze wrap Electronic Signature(s) Signed: 09/03/2017 11:42:03 AM By: [name] Entered By: [name] on 09/03/2017 10:31:20 Knappenberger, Nyeemah V. ([id number]) -------------------------------------------------------------------------------- Vitals Details Patient Name: Narvaez, Namira V. Date of Service: 09/03/2017 10:15 AM Medical Record Number: [id number] Patient Account Number: [id number] Date of Birth/Sex: 1940/10/13 (77 y.o. Female) Treating RN: [name] Primary Care Harvis Mabus: [name] Other Clinician: Referring Muaz Shorey: [name] Treating Ellanore Vanhook/Extender: [name] in Treatment: 43 Vital Signs Time Taken: 10:24 Temperature (F): 97.6 Height (in): 64 Pulse (bpm): 72 Weight (lbs): 200 Respiratory Rate (breaths/min): 16 Body Mass Index (BMI): 34.3 Blood Pressure (mmHg): 149/51 Reference Range: 80 - 120 mg / dl Electronic Signature(s) Signed: 09/03/2017 11:42:03 AM By: [name] Entered By: [name] on 09/03/2017 10:24:35

## 2017-09-04 NOTE — Progress Notes (Signed)
KYRENE, LONGAN ([id number]) Visit Report for 09/03/2017 HPI Details Patient Name: [name], [name] Date of Service: 09/03/2017 10:15 AM Medical Record Number: [id number] Patient Account Number: [id number] Date of Birth/Sex: [birth year] (77 y.o. Female) Treating RN: Roger Shelter Primary Care Provider: Beverlyn Roux Other Clinician: Referring Provider: Beverlyn Roux Treating Provider/Extender: Tito Dine in Treatment: 37 History of Present Illness HPI Description: 08/13/16: This is a 77 year old woman who came predominantly for review of 3 cm in diameter circular wound to the left anterior lateral leg. She was in the ER on 08/01/16 I reviewed their notes. There was apparently pus coming out of the wound at that time and the patient arrived requesting debridement which they don't do in the emergency room. Nevertheless I can't see that they did any x-rays. There were no cultures done. She is a type II diabetic and I a note after the patient was in the clinic that she had a bypass graft from the popliteal to the tibial on the right on 02/28/16. She also had a right greater saphenous vein harvest on the same date for arterial bypass. She is going to have vascular studies including ABIs T ABIs on the right on 08/28/16. The patient's surgery was on 02/28/16 by Dr. Vallarie Mare she had a right below the knee popliteal artery to peroneal artery bypass with reverse greater saphenous vein and an endarterectomy of the mid segment peroneal artery. Postoperatively she had a strong mild monophasic peroneal signal with a pink foot. It would appear that the patient is had some nonhealing in the surgical saphenous vein harvest site on the left leg. Surprisingly looking through [facility] I cannot see much information about this at all. Dr. Lucious Groves notes from 05/29/16 show that the patient's wounds "are not healed" the right first metatarsal wound healed but then opened back up. 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 At that point the patient had a wound VAC to her right calf with regards to the wounds on her dorsal right toe would appear that these are felt to be arterial wounds. She has had surgery on the metatarsal phalangeal in 2015 I Dr. Doran Durand secondary to a right metatarsal phalangeal joint fracture. She is apparently had discoloration around this area since then. 08/28/16; patient arrives with her wounds in much the same condition. The linear vein harvest site and the circular wound below it which I think was a blister. She also has to probing holes in her right great toe and a necrotic eschar on the right second toe. Because of these being arterial wounds I reduced her compression from 3-2 layers this seems to of done satisfactorily she has not had any problems. I cannot see that she is actually had an x-ray ====== 11/06/16 the patient comes in for evaluation of her right lower extremity ulcers. She was here in January 2018 for 2 visits subsequently ended up in the hospital with pneumonia and then to rehabilitation. She has now been discharged from rehabilitation and is home. She has multiple ulcerations to her right lower extremity including the foot and toes. She does have home health in place and they have been placing alginate to the ulcers. She is followed by Dr. Bridgett Larsson of vascular medicine. 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 She recently saw him on 3/23. In office ABIs were:  Right 0.48 with monophasic flow to the DP, PT, peroneal Left 0.63 with monophasic flow to the DP and PT Her arterial studies indicated a patent right below knee popliteal to peroneal bypass She had an MRI in February 2008 that was negative frosty myelitis but this showed general soft tissue edema in the right foot and lower extremity  concerning for cellulitis She is a diabetic, managed with insulin. Her hemoglobin A1c in December 2017 was 8.4 which is a trend up from previous levels. She had blood work in February 2018 which revealed an albumin of 2.6 this appears to be relatively acute as an albumin in November 2017 was 3.7 11/13/16; this is a patient I have not seen since February who is readmitted to our clinic last week. 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 Bridgett Larsson. I have reviewed Dr. Lianne Moris notes from March/23/18. Doppler ABI on that date showed an ABI on the right of 0.48 and on the left of 0.63. Dorsalis pedis waveforms were monophasic bilaterally. There was no waveforms detected at the posterior tibial on the right, monophasic on the left. Dr. Geoffry Paradise, [name] ([id number]) Chen's comments were that this patient would have follow-up vascular studies in 3 months including ABIs and right lower extremity arterial duplex. She had an MRI in February that was negative for osteomyelitis but showed generalized edema in the foot. Last albumin I see was in January at 3.4 we have been using Santyl to the 4 wounds on the right leg the patient is noted today to have widespread edema well up towards her groin this is pitting 2-3+. 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 Normal ejection fraction. 11/20/16; patient has a follow-up appointment with Dr. Bridgett Larsson on April 23. She is still complaining of a lot of pain in the right foot and right leg. It is not clear to me that this is at all positional however I think it is clear claudication with minimal activity perhaps at rest. At our suggestion she is return to her primary physician's office tomorrow with regards to her pitting lower extremity bilateral edema that I reviewed in detail last week 11/27/16; the follow-up with Dr. Bridgett Larsson was actually on May 23 on April  23 as I stated in my note last week. N/A case all of her wounds seems somewhat smaller. The 2 on the right leg are definitely smaller. The areas on the dorsal right first toe, right third toe and the lateral part of the right fifth metatarsal head all looks smaller but have tightly adherent surfaces. We have been using Santyl 12/04/16; follow-up with Dr. Bridgett Larsson on May 23. 2 small open wounds on the right leg continue to get smaller. The area on the right third total lateral aspect of the right fifth toe also look better. The remaining area on the dorsal first toe still has some depth to it. We have been using Santyl to the toes and collagen on the right 12/11/16; according to patient's husband the follow-up with Dr. Bridgett Larsson is not until July. All of the wounds on the right leg are measuring smaller. We have been using some combination of Prisma and Santyl although I think we can go to straight Prisma today. There may have been some confusion with home health about the primary dressing orders here. 12/25/16; the patient has had some healing this week. The area on her right  lateral fifth metatarsal head, right third toe are both healed lower right leg is healed. In the vein harvest site superiorly she has one superficial open area. On the dorsal aspect of her right great toe/MTP joint the wound is now divided into 2 however the proximal area is deep and there is palpable bone 01/01/17; still an open area in the middle of her original right surgical scar. The area on the right third toe and right lateral fifth metatarsal head remained closed. Problematic area on the dorsal aspect of the toe. Previous surgery in this area line 01/08/17 small open area on the original scar on her upper anterior leg although this is closing. X-ray I did of the right first toe did not show underlying bony abnormality. Still this area on the dorsal first toe probes to bone. We have been using Prisma 01/15/17; small open area in the  original scar in the upper anterior leg is almost fully closed. She has 2 open areas over the dorsal aspect of the right first toe that probes to bone. Used and a form starting last week. Vigorous bone scraping that I did last week showed few methicillin sensitive staph aureus. She is allergic to penicillin and sulfa drugs. I'm going to give her 2 weeks of doxycycline. She will need an MRI with contrast. 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 She continues on doxycycline for a bone scraping that showed methicillin sensitive staph aureus [allergic to penicillin and sulfa]. We have been using Endo form to the wound. 01/29/17; surprisingly her MRI did not show osteomyelitis. She continues on doxycycline for a bone scraping that showed methicillin sensitive staph aureus with allergies to penicillin and sulfa. We have been using Endo form to the wound. Unfortunately I cannot get a surface on this visit looks like it is able to support a healing state. Proximally there is still exposed bone. There is no overt soft tissue tenderness. Her MRI did show a previous fracture was surgery to this area but no hardware 02/12/17 on evaluation today patient appears to be doing well in regard to her lower extremity wound. She does have some mild discomfort but this is minimal. There's no evidence of infection. Her left great toe nail has somewhat been lifted up and there is a little bit of slight bleeding underneath but this is still firmly attached. 02/19/17; she ran out of doxycycline 2 days ago. Nevertheless I would like to continue this for 2 weeks to make a full 6 weeks of therapy as the bone scraping that I did from the open area on the dorsal right first toe showed MRSA. This should complete antibiotic therapy. 02/26/17; the patient is completing her 6 weeks of oral doxycycline. Deep wound on the dorsal right toe. This still has  some exposed bone proximally. She does have a reasonably granulated surface albeit thin over bone. I've been using Endo form have made application for an amniotic skin sub 03/05/17; the patient has completed 6 weeks of doxycycline. This is a deep wound on the dorsal right toe which has exposed bone proximally. She has granulation over most of this wound albeit a thin layer. I've been using Endo form. Still do not have approval for Affinity 03/13/17 on evaluation today patient's right foot wound appears to be doing better measurement wise compared to her last evaluation. She has been tolerating the dressing change without complication and  does have a appointment with the dietary that has been made as far as referral is concerned there just waiting for her and transportation to contact them back for an actual date. Nonetheless patient has been having less pain at this point. No fevers, chills, nausea, or vomiting noted at this time. 03/26/17; linear wound over the dorsal aspect of her right great toe. At one point this had exposed bone on the most superior aspect however I am pleased to see today that this appears to have a surface of granulation. Apparently product was applied for but denied by [facility]. We'll need to see what that was. The patient has known PAD status post revascularization. MRI Whitby, Cerise V. ([id number]) did not show osteomyelitis which was done in June 04/02/17; continued improvement using Endoform. She was approved for Oasis but hasn't over $993 co-pay per application, this is beyond her means 04/09/17; continues on Endoform. 04/16/17; appears to be doing nicely continuing on Endoform 04/22/17; patient did not have her dressing changed all week because of the weather. Using Endoform. Base of the wound looks healthy elbow there appears to events surrounding maceration perhaps because of the drainage was not changing the wound dressing 04/30/17 wound today continues to close and except  for a small divot on the superior part of the wound. This area did not probe the bone but I found this a little concerning as this was the area with exposed bone before we be able to get this to granulate forward. As I remember things she is not a candidate for skin substitutes secondary to an outlandish co-pay. I asked her husband to look into the out of pocket max for medications if he has 1 [Regranex] or totally for skin substitutes 05/07/17; the patient arrives today with a wound roughly the same size although under careful inspection under the light there appears to be some epithelialization. Her intake nurse noted that the Endoform seemed to be placed over the wound rather than in the deeper divots they could really benefit from the Endoform. At this point I have no plans to change the Endoform as at one point this was at least 33% exposed bone and the rest of the wound very close to the underlying phalanx 05/14/17; no major change in the size or appearance of the wound. We have been using Endoform for a prolonged period of time with reasonably good improvement in the epithelialization i.e. no exposed bone especially proximally. Unless we've not made a lot of changes in the last several weeks. She does not complain of pain. She was revascularized early this year or perhaps late last year by Dr. Bridgett Larsson and I wonder if he will need to have a look at her, I will need to review the actual vascular procedure which I think was a distal popliteal bypass 05/21/17; switched to either for a blue last week. Patient arrived in clinic with the wound not measuring any better but the surface looking some better. Unfortunately she had some surface slough and when I went to remove this superiorly she had exposed bone. Previously she had had exposed bone in the inferior part of the wound however this is granulated over some weeks ago. Clearly this is a major step back for her. With some difficulty I was able to  obtain a piece of the bone probing through the superior part of the wound for culture. We did not send pathology. It is likely that she is going to need imaging of this site but  I did not order this today 05/28/17; bone culture I took last week showed MSSA. She still has exposed bone however I could not get another piece for pathology. She had an MRI 4 months ago that did not show osteoarthritic at time. Wound rapidly deteriorated superiorly and I suspect this is underlying osteomyelitis. We have been using Hydrofera Blue 06/04/17 on evaluation today patient's wound appears to actually be doing a little bit better visually compared to last week's evaluation which is good news. Nonetheless she does have osteomyelitis of the toe which does not appear to be MRSA. No fevers, chills, nausea, or vomiting noted at this time. She is having no discomfort. 06/11/17; patient's wound looks a little healthier than I remember seeing it 2 weeks ago. There is no exposed bone and the surface of the wound appears well granulated. We've been using hydrofera blue. I note that she was started on doxycycline which was MSSA. Her appointment with Dr. Ola Spurr is on November 12 I believe. 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 Vallarie Mare of vascular surgery on 11/9. He offered right leg angiography possible intervention however the patient refused. I've discussed this with the patient's husband today she did not want any further procedures. Also noteworthy that Dr. Vallarie Mare stated that this wound had not healed in 3 years, I was not aware of this degree of chronicity in this area. She has underlying osteomyelitis by bone culture. Culture of this grew MSSA, she is allergic to penicillin and therefore not a candidate for beta lactams. She saw Dr. Ola Spurr of infectious disease this morning, he continued her on doxycycline for another month and apparently sent in a prescription. We have been using  Hydrofera Blue. ABI's update by Dr. Bridgett Larsson right 0.62, left 0.89. Monophasic wave forms 06/25/17 on evaluation today patient appears to be doing well in regard to her right great toe wound. She is still taking the doxycycline as prescribed by Dr. Ola Spurr. The Hydrofera Blue Dressing's to be doing as well as anything has for her up to this point and we are still waiting on an insurance authorization for the Oasis. She is having no pain which is good news. No fevers, chills, nausea, or vomiting noted at this time. 07/02/17; still taking doxycycline as prescribed by Dr. Ola Spurr. Hydrofera Blue continues. We have no palpable bone. Still have not had had approval of Apligraf 08/06/16; the patient arrives today with [facility] even though we apparently had changed to Sorbact last time. Her co-pay for Regranex is $389 in discussion with the patient and her husband this was excessive. We'll continue with the Sorbact and standard dressings for now 08/20/17; we have been using sorbact. The wound bed still requires debridement. Wound measurements are slightly better. 09/03/17;using Sorbact.no debridement today. Wound measurements slightly better. There is some discoloration of the tip of her toelooks like bruising Tignor, YISSEL HABERMEHL ([id number]) Electronic Signature(s) Signed: 09/03/2017 4:49:25 PM By: Linton Ham MD Entered By: Linton Ham on 09/03/2017 11:54:29 Vandenbos, Dorrene [name] ([id number]) -------------------------------------------------------------------------------- Physical Exam Details Patient Name: Loconte, [name] Date of Service: 09/03/2017 10:15 AM Medical Record Number: [id number] Patient Account Number: [id number] Date of Birth/Sex: November 17, 1940 (77 y.o. Female) Treating RN: Roger Shelter Primary Care Provider: Beverlyn Roux Other Clinician: Referring Provider: Beverlyn Roux Treating Provider/Extender: Tito Dine in Treatment: 7 Constitutional Patient is  hypertensive.. Pulse regular and within target range for patient.[name] Respirations regular, non-labored and within target range.. Temperature is normal and within the target range for  the patient.[name] appears in no distress. Eyes Conjunctivae clear. No discharge. Respiratory Respiratory effort is easy and symmetric bilaterally. Rate is normal at rest and on room air.. Cardiovascular Pedal pulses palpable albeit reduced. Edema present in both extremities.this is nonpitting and I think mostly lymphedema. Lymphatic none palpable in the popliteal or inguinal area. Psychiatric No evidence of depression, anxiety, or agitation. Calm, cooperative, and communicative. Appropriate interactions and affect.. Notes wound exam; the patient's wounds on the dorsal first toe.there is no exposed bonebut there is still depthto it there is less debris.no debridement today.there is discoloration at the tip of the toe but no tenderness.she does have PAD however the toe was warm Electronic Signature(s) Signed: 09/03/2017 4:49:25 PM By: Linton Ham MD Entered By: Linton Ham on 09/03/2017 11:59:27 Rehfeld, Taler [name] ([id number]) -------------------------------------------------------------------------------- Physician Orders Details Patient Name: Marchetti, Liliana V. Date of Service: 09/03/2017 10:15 AM Medical Record Number: [id number] Patient Account Number: [id number] Date of Birth/Sex: 1941/07/12 (77 y.o. Female) Treating RN: Roger Shelter Primary Care Provider: Beverlyn Roux Other Clinician: Referring Provider: Beverlyn Roux Treating Provider/Extender: Tito Dine in Treatment: 40 Verbal / Phone Orders: No Diagnosis Coding Wound Cleansing Wound #7 Right,Dorsal Metatarsal head first o Clean wound with Normal Saline. o Cleanse wound with mild soap and water Anesthetic (add to Medication List) Wound #7 Right,Dorsal Metatarsal head first o Topical Lidocaine 4% cream applied to wound bed prior  to debridement (In Clinic Only). Primary Wound Dressing Wound #7 Right,Dorsal Metatarsal head first o Other: - Sorbact Secondary Dressing Wound #7 Right,Dorsal Metatarsal head first o ABD pad o Conform/Kerlix Dressing Change Frequency Wound #7 Right,Dorsal Metatarsal head first o Change Dressing Monday, Wednesday, Friday Follow-up Appointments Wound #7 Right,Dorsal Metatarsal head first o Return Appointment in 2 weeks. Edema Control Wound #7 Right,Dorsal Metatarsal head first o Elevate legs to the level of the heart and pump ankles as often as possible Additional Orders / Instructions Wound #7 Right,Dorsal Metatarsal head first o Increase protein intake. o Activity as tolerated Home Health Wound #7 Right,Dorsal Metatarsal head first o Continue Home Health Visits - [facility] twice weekly, [facility]-Wednesday o Home Health Nurse may visit PRN to address patientos wound care needs. o FACE TO FACE ENCOUNTER: MEDICARE and MEDICAID PATIENTS: I certify that this patient is under my care and that I had a face-to-face encounter that meets the physician face-to-face encounter requirements with this Hirschi, Devita V. ([id number]) patient on this date. The encounter with the patient was in whole or in part for the following MEDICAL CONDITION: (primary reason for [place]) MEDICAL NECESSITY: I certify, that based on my findings, NURSING services are a medically necessary home health service. HOME BOUND STATUS: I certify that my clinical findings support that this patient is homebound (i.e., Due to illness or injury, pt requires aid of supportive devices such as crutches, cane, wheelchairs, walkers, the use of special transportation or the assistance of another person to leave their place of residence. There is a normal inability to leave the home and doing so requires considerable and taxing effort. Other absences are for medical reasons / religious services  and are infrequent or of short duration when for other reasons). o If current dressing causes regression in wound condition, may D/C ordered dressing product/s and apply Normal Saline Moist Dressing daily until next [place] / Other MD appointment. Patterson of regression in wound condition at [phone]. o Please direct any NON-WOUND related issues/requests for orders to patient's Primary Care  Physician Medications-please add to medication list. Wound #7 Right,Dorsal Metatarsal head first o P.O. Antibiotics - continue antibiotics as prescribed Electronic Signature(s) Signed: 09/03/2017 11:42:03 AM By: Roger Shelter Signed: 09/03/2017 4:49:25 PM By: Linton Ham MD Entered By: Roger Shelter on 09/03/2017 11:02:55 Havlicek, Bellamie V. ([id number]) -------------------------------------------------------------------------------- Problem List Details Patient Name: [name], [name] Date of Service: 09/03/2017 10:15 AM Medical Record Number: [id number] Patient Account Number: [id number] Date of Birth/Sex: Jul 27, 1941 (77 y.o. 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 ([id number]) -------------------------------------------------------------------------------- Progress Note Details Patient Name: Polo, Brittanyann V. Date of Service: 09/03/2017 10:15 AM Medical Record Number: [id number] Patient Account Number: [id number] Date of Birth/Sex: [birth year] (77 y.o. Female) Treating RN: Roger Shelter Primary Care Provider: Beverlyn Roux Other Clinician: Referring Provider: Beverlyn Roux Treating Provider/Extender: Tito Dine in Treatment: 43 Subjective History of Present Illness (HPI) 08/13/16: This is a 77 year old woman who came predominantly for review of 3 cm in diameter circular wound to the left anterior lateral leg. She was in the ER on 08/01/16 I reviewed their notes. There was apparently pus coming out of the wound at that time and the patient arrived requesting debridement which they don't do in the emergency room. Nevertheless I can't see that they did any x-rays. There were no cultures done. She is a type II diabetic and I a note after the patient was in the clinic that she had a bypass graft from the popliteal to the tibial on the right on 02/28/16. She also had a right greater saphenous vein harvest on the same date for arterial bypass. She is going to have vascular studies including ABIs T ABIs on the right on 08/28/16. The patient's surgery was on 02/28/16 by Dr. Vallarie Mare she had a right below the knee popliteal artery to peroneal artery bypass with reverse greater saphenous vein and an endarterectomy of the mid segment peroneal artery. Postoperatively she had a strong mild monophasic peroneal signal with a pink foot. It would appear that the  patient is had some nonhealing in the surgical saphenous vein harvest site on the left leg. Surprisingly looking through [facility] I cannot see much information about this at all. Dr. Lucious Groves notes from 05/29/16 show that the patient's wounds "are not healed" the right first metatarsal wound healed but then opened back up. 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 At that point the patient had a wound VAC to her right calf with regards to the wounds on her dorsal right toe would appear that these are felt  to be arterial wounds. She has had surgery on the metatarsal phalangeal in 2015 I Dr. Doran Durand secondary to a right metatarsal phalangeal joint fracture. She is apparently had discoloration around this area since then. 08/28/16; patient arrives with her wounds in much the same condition. The linear vein harvest site and the circular wound below it which I think was a blister. She also has to probing holes in her right great toe and a necrotic eschar on the right second toe. Because of these being arterial wounds I reduced her compression from 3-2 layers this seems to of done satisfactorily she has not had any problems. I cannot see that she is actually had an x-ray ====== 11/06/16 the patient comes in for evaluation of her right lower extremity ulcers. She was here in January 2018 for 2 visits subsequently ended up in the hospital with pneumonia and then to rehabilitation. She has now been discharged from rehabilitation and is home. She has multiple ulcerations to her right lower extremity including the foot and toes. She does have home health in place and they have been placing alginate to the ulcers. She is followed by Dr. Bridgett Larsson of vascular medicine. 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 She recently saw him on 3/23. In  office ABIs were: Right 0.48 with monophasic flow to the DP, PT, peroneal Left 0.63 with monophasic flow to the DP and PT Her arterial studies indicated a patent right below knee popliteal to peroneal bypass She had an MRI in February 2008 that was negative frosty myelitis but this showed general soft tissue edema in the right foot and lower extremity concerning for cellulitis She is a diabetic, managed with insulin. Her hemoglobin A1c in December 2017 was 8.4 which is a trend up from previous levels. She had blood work in February 2018 which revealed an albumin of 2.6 this appears to be relatively acute as an albumin in November 2017 was 3.7 11/13/16; this is a patient I have not seen since February who is readmitted to our clinic last week. 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 Bridgett Larsson. I have reviewed Dr. Lianne Moris notes from March/23/18. Doppler ABI on that date showed an ABI on the right of 0.48 and on the left of 0.63. Dorsalis pedis waveforms were monophasic bilaterally. There was no waveforms detected at the posterior tibial on the right, monophasic on the left. Dr. Lianne Moris comments were that this patient would have follow-up vascular studies in 3 months including ABIs and right lower extremity arterial duplex. She had an MRI in February that was negative for osteomyelitis but showed generalized edema in [name], [name] ([id number]) the foot. Last albumin I see was in January at 3.4 we have been using Santyl to the 4 wounds on the right leg the patient is noted today to have widespread edema well up towards her groin this is pitting 2-3+. 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 Normal ejection fraction. 11/20/16; patient has a follow-up appointment with Dr. Bridgett Larsson on April 23. She is still complaining of a lot of pain in the right foot and right leg. It is not clear  to me that this is at all positional however I think it is clear claudication with minimal activity perhaps at rest. At our suggestion she is return to  her primary physician's office tomorrow with regards to her pitting lower extremity bilateral edema that I reviewed in detail last week 11/27/16; the follow-up with Dr. Bridgett Larsson was actually on May 23 on April 23 as I stated in my note last week. N/A case all of her wounds seems somewhat smaller. The 2 on the right leg are definitely smaller. The areas on the dorsal right first toe, right third toe and the lateral part of the right fifth metatarsal head all looks smaller but have tightly adherent surfaces. We have been using Santyl 12/04/16; follow-up with Dr. Bridgett Larsson on May 23. 2 small open wounds on the right leg continue to get smaller. The area on the right third total lateral aspect of the right fifth toe also look better. The remaining area on the dorsal first toe still has some depth to it. We have been using Santyl to the toes and collagen on the right 12/11/16; according to patient's husband the follow-up with Dr. Bridgett Larsson is not until July. All of the wounds on the right leg are measuring smaller. We have been using some combination of Prisma and Santyl although I think we can go to straight Prisma today. There may have been some confusion with home health about the primary dressing orders here. 12/25/16; the patient has had some healing this week. The area on her right lateral fifth metatarsal head, right third toe are both healed lower right leg is healed. In the vein harvest site superiorly she has one superficial open area. On the dorsal aspect of her right great toe/MTP joint the wound is now divided into 2 however the proximal area is deep and there is palpable bone 01/01/17; still an open area in the middle of her original right surgical scar. The area on the right third toe and right lateral fifth metatarsal head remained closed. Problematic area on  the dorsal aspect of the toe. Previous surgery in this area line 01/08/17 small open area on the original scar on her upper anterior leg although this is closing. X-ray I did of the right first toe did not show underlying bony abnormality. Still this area on the dorsal first toe probes to bone. We have been using Prisma 01/15/17; small open area in the original scar in the upper anterior leg is almost fully closed. She has 2 open areas over the dorsal aspect of the right first toe that probes to bone. Used and a form starting last week. Vigorous bone scraping that I did last week showed few methicillin sensitive staph aureus. She is allergic to penicillin and sulfa drugs. I'm going to give her 2 weeks of doxycycline. She will need an MRI with contrast. 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 She continues on doxycycline for a bone scraping that showed methicillin sensitive staph aureus [allergic to penicillin and sulfa]. We have been using Endo form to the wound. 01/29/17; surprisingly her MRI did not show osteomyelitis. She continues on doxycycline for a bone scraping that showed methicillin sensitive staph aureus with allergies to penicillin and sulfa. We have been using Endo form to the wound. Unfortunately I cannot get a surface on this visit looks like it is able to support a healing state. Proximally there is still exposed bone. There is no overt soft tissue tenderness. Her MRI did show a previous fracture was surgery to this area but no hardware 02/12/17 on evaluation today  patient appears to be doing well in regard to her lower extremity wound. She does have some mild discomfort but this is minimal. There's no evidence of infection. Her left great toe nail has somewhat been lifted up and there is a little bit of slight bleeding underneath but this is still firmly attached. 02/19/17; she ran out of doxycycline 2 days  ago. Nevertheless I would like to continue this for 2 weeks to make a full 6 weeks of therapy as the bone scraping that I did from the open area on the dorsal right first toe showed MRSA. This should complete antibiotic therapy. 02/26/17; the patient is completing her 6 weeks of oral doxycycline. Deep wound on the dorsal right toe. This still has some exposed bone proximally. She does have a reasonably granulated surface albeit thin over bone. I've been using Endo form have made application for an amniotic skin sub 03/05/17; the patient has completed 6 weeks of doxycycline. This is a deep wound on the dorsal right toe which has exposed bone proximally. She has granulation over most of this wound albeit a thin layer. I've been using Endo form. Still do not have approval for Affinity 03/13/17 on evaluation today patient's right foot wound appears to be doing better measurement wise compared to her last evaluation. She has been tolerating the dressing change without complication and does have a appointment with the dietary that has been made as far as referral is concerned there just waiting for her and transportation to contact them back for an actual date. Nonetheless patient has been having less pain at this point. No fevers, chills, nausea, or vomiting noted at this time. 03/26/17; linear wound over the dorsal aspect of her right great toe. At one point this had exposed bone on the most superior aspect however I am pleased to see today that this appears to have a surface of granulation. Apparently product was applied for but denied by [facility]. We'll need to see what that was. The patient has known PAD status post revascularization. MRI did not show osteomyelitis which was done in June 04/02/17; continued improvement using Endoform. She was approved for Oasis but hasn't over $413 co-pay per application, Hearld, Phung V. ([phone]) this is beyond her means 04/09/17; continues on Endoform. 04/16/17; appears  to be doing nicely continuing on Endoform 04/22/17; patient did not have her dressing changed all week because of the weather. Using Endoform. Base of the wound looks healthy elbow there appears to events surrounding maceration perhaps because of the drainage was not changing the wound dressing 04/30/17 wound today continues to close and except for a small divot on the superior part of the wound. This area did not probe the bone but I found this a little concerning as this was the area with exposed bone before we be able to get this to granulate forward. As I remember things she is not a candidate for skin substitutes secondary to an outlandish co-pay. I asked her husband to look into the out of pocket max for medications if he has 1 [Regranex] or totally for skin substitutes 05/07/17; the patient arrives today with a wound roughly the same size although under careful inspection under the light there appears to be some epithelialization. Her intake nurse noted that the Endoform seemed to be placed over the wound rather than in the deeper divots they could really benefit from the Endoform. At this point I have no plans to change the Endoform as at one point this was  at least 33% exposed bone and the rest of the wound very close to the underlying phalanx 05/14/17; no major change in the size or appearance of the wound. We have been using Endoform for a prolonged period of time with reasonably good improvement in the epithelialization i.e. no exposed bone especially proximally. Unless we've not made a lot of changes in the last several weeks. She does not complain of pain. She was revascularized early this year or perhaps late last year by Dr. Bridgett Larsson and I wonder if he will need to have a look at her, I will need to review the actual vascular procedure which I think was a distal popliteal bypass 05/21/17; switched to either for a blue last week. Patient arrived in clinic with the wound not measuring any  better but the surface looking some better. Unfortunately she had some surface slough and when I went to remove this superiorly she had exposed bone. Previously she had had exposed bone in the inferior part of the wound however this is granulated over some weeks ago. Clearly this is a major step back for her. With some difficulty I was able to obtain a piece of the bone probing through the superior part of the wound for culture. We did not send pathology. It is likely that she is going to need imaging of this site but I did not order this today 05/28/17; bone culture I took last week showed MSSA. She still has exposed bone however I could not get another piece for pathology. She had an MRI 4 months ago that did not show osteoarthritic at time. Wound rapidly deteriorated superiorly and I suspect this is underlying osteomyelitis. We have been using Hydrofera Blue 06/04/17 on evaluation today patient's wound appears to actually be doing a little bit better visually compared to last week's evaluation which is good news. Nonetheless she does have osteomyelitis of the toe which does not appear to be MRSA. No fevers, chills, nausea, or vomiting noted at this time. She is having no discomfort. 06/11/17; patient's wound looks a little healthier than I remember seeing it 2 weeks ago. There is no exposed bone and the surface of the wound appears well granulated. We've been using hydrofera blue. I note that she was started on doxycycline which was MSSA. Her appointment with Dr. Ola Spurr is on November 12 I believe. 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 Vallarie Mare of vascular surgery on 11/9. He offered right leg angiography possible intervention however the patient refused. I've discussed this with the patient's husband today she did not want any further procedures. Also noteworthy that Dr. Vallarie Mare stated that this wound had not healed in 3 years, I was not aware of this degree of  chronicity in this area. She has underlying osteomyelitis by bone culture. Culture of this grew MSSA, she is allergic to penicillin and therefore not a candidate for beta lactams. She saw Dr. Ola Spurr of infectious disease this morning, he continued her on doxycycline for another month and apparently sent in a prescription. We have been using Hydrofera Blue. ABI's update by Dr. Bridgett Larsson right 0.62, left 0.89. Monophasic wave forms 06/25/17 on evaluation today patient appears to be doing well in regard to her right great toe wound. She is still taking the doxycycline as prescribed by Dr. Ola Spurr. The Hydrofera Blue Dressing's to be doing as well as anything has for her up to this point and we are still waiting on an insurance authorization for the [place]. She  is having no pain which is good news. No fevers, chills, nausea, or vomiting noted at this time. 07/02/17; still taking doxycycline as prescribed by Dr. Ola Spurr. Hydrofera Blue continues. We have no palpable bone. Still have not had had approval of Apligraf 08/06/16; the patient arrives today with [facility] even though we apparently had changed to Sorbact last time. Her co-pay for Regranex is $389 in discussion with the patient and her husband this was excessive. We'll continue with the Sorbact and standard dressings for now 08/20/17; we have been using sorbact. The wound bed still requires debridement. Wound measurements are slightly better. 09/03/17;using Sorbact.no debridement today. Wound measurements slightly better. There is some discoloration of the tip of her toelooks like bruising [name], [name] ([id number]) Objective Constitutional Patient is hypertensive.. Pulse regular and within target range for patient.[name] Respirations regular, non-labored and within target range.. Temperature is normal and within the target range for the patient.[name] appears in no distress. Vitals Time Taken: 10:24 AM, Height: 64 in, Weight: 200 lbs, BMI:  34.3, Temperature: 97.6 F, Pulse: 72 bpm, Respiratory Rate: 16 breaths/min, Blood Pressure: 149/51 mmHg. Eyes Conjunctivae clear. No discharge. Respiratory Respiratory effort is easy and symmetric bilaterally. Rate is normal at rest and on room air.. Cardiovascular Pedal pulses palpable albeit reduced. Edema present in both extremities.this is nonpitting and I think mostly lymphedema. Lymphatic none palpable in the popliteal or inguinal area. Psychiatric No evidence of depression, anxiety, or agitation. Calm, cooperative, and communicative. Appropriate interactions and affect.. General Notes: wound exam; the patient's wounds on the dorsal first toe.there is no exposed bonebut there is still depthto it there is less debris.no debridement today.there is discoloration at the tip of the toe but no tenderness.she does have PAD however the toe was warm Integumentary (Hair, Skin) Wound #7 status is Open. Original cause of wound was Gradually Appeared. The wound is located on the Right,Dorsal Metatarsal head first. The wound measures 1.4cm length x 0.5cm width x 0.3cm depth; 0.55cm^2 area and 0.165cm^3 volume. There is Fat Layer (Subcutaneous Tissue) Exposed exposed. There is no tunneling or undermining noted. There is a medium amount of serous drainage noted. The wound margin is flat and intact. There is medium (34-66%) red granulation within the wound bed. There is a medium (34-66%) amount of necrotic tissue within the wound bed including Adherent Slough. The periwound skin appearance exhibited: Induration, Hemosiderin Staining. The periwound skin appearance did not exhibit: Callus, Crepitus, Excoriation, Rash, Scarring, Dry/Scaly, Maceration, Atrophie Blanche, Cyanosis, Ecchymosis, Mottled, Pallor, Rubor, Erythema. The periwound has tenderness on palpation. 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 Cleanse wound with mild soap and water Anesthetic (add to Medication List): Wound #7 Right,Dorsal Metatarsal head first: Topical Lidocaine 4% cream applied to wound bed prior to debridement (In Clinic Only). Primary Wound Dressing: Wound #7 Right,Dorsal Metatarsal head first: Other: - Sorbact Secondary Dressing:  Wound #7 Right,Dorsal Metatarsal head first: ABD pad Conform/Kerlix Dressing Change Frequency: Wound #7 Right,Dorsal Metatarsal head first: Change Dressing Monday, Wednesday, Friday Follow-up Appointments: Wound #7 Right,Dorsal Metatarsal head first: Return Appointment in 2 weeks. Edema Control: Wound #7 Right,Dorsal Metatarsal head first: Elevate legs to the level of the heart and pump ankles as often as possible Additional Orders / Instructions: Wound #7 Right,Dorsal Metatarsal head first: Increase protein intake. Activity as tolerated Home Health: Wound #7 Right,Dorsal Metatarsal head first: [place] [facility], [facility] Home Health Nurse may visit PRN to address patient s wound care needs. FACE TO FACE ENCOUNTER: MEDICARE and MEDICAID PATIENTS: I certify that this patient is under my care and that I had a face-to-face encounter that meets the physician face-to-face encounter requirements with this  patient on this date. The encounter with the patient was in whole or in part for the following MEDICAL CONDITION: (primary reason for Tucker) MEDICAL NECESSITY: I certify, that based on my findings, NURSING services are a medically necessary home health service. HOME BOUND STATUS: I certify that my clinical findings support that this patient is homebound (i.e., Due to illness or injury, pt requires aid of supportive devices such as crutches, cane, wheelchairs, walkers, the use of special transportation or the assistance of another person to leave their place of residence. There is a normal inability to leave the home and doing so requires considerable and taxing effort. Other absences are for medical reasons / religious services and are infrequent or of short duration when for other reasons). If current dressing causes regression in wound condition, may D/C ordered dressing product/s and apply Normal Saline Moist Dressing daily until next [place] / Other MD appointment. Banks of regression in wound condition at [phone]. Please direct any NON-WOUND related issues/requests for orders to patient's Primary Care Physician Medications-please add to medication list.: Wound #7 Right,Dorsal Metatarsal head first: P.O. Antibiotics - continue antibiotics as prescribed Kuper, Lidia V. ([id number]) #1continue Sorbact for now #2as of late last year we did not have an advance product option #3discoloration at the tip of her toebut it is not overtly infectedor overtly ischemicall need to watch this. I wonder if this is a pressure areaalthough she is not on her feet that much Electronic Signature(s) Signed: 09/03/2017 4:49:25 PM By: Linton Ham MD Entered By: Linton Ham on 09/03/2017 12:05:12 Sigman, Triniti [name] ([id number]) -------------------------------------------------------------------------------- SuperBill Details Patient Name: Lorton, [name] Date  of Service: 09/03/2017 Medical Record Number: [id number] Patient Account Number: [id number] Date of Birth/Sex: 1940-09-05 (77 y.o. 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 I70.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, right ankle and foot Facility Procedures CPT4 Code: [id number] Description: 01093 - WOUND CARE VISIT-LEV 2 EST PT Modifier: Quantity: 1 Physician Procedures CPT4 Code Description: [id number] - WC PHYS LEVEL 3 - EST PT ICD-10 Diagnosis Description L97.519 Non-pressure chronic  ulcer of other part of right foot with unspe E11.622 Type 2 diabetes mellitus with other skin ulcer I70.235 Atherosclerosis of  native arteries of right leg with ulceration o Modifier: cified severity f other part of fo Quantity: 1 ot Electronic Signature(s) Signed: 09/03/2017 4:49:25 PM By: Linton Ham MD Entered By: Linton Ham on 09/03/2017 12:05:34

## 2017-09-17 ENCOUNTER — Encounter: Payer: Medicare HMO | Attending: Internal Medicine | Admitting: Internal Medicine

## 2017-09-17 DIAGNOSIS — I70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 of foot: Secondary | ICD-10-CM | POA: Insufficient documentation

## 2017-09-17 DIAGNOSIS — M86671 Other chronic osteomyelitis, right ankle and foot: Secondary | ICD-10-CM | POA: Insufficient documentation

## 2017-09-17 DIAGNOSIS — L97519 Non-pressure chronic ulcer of other part of right foot with unspecified severity: Secondary | ICD-10-CM | POA: Diagnosis not present

## 2017-09-17 DIAGNOSIS — E1151 Type 2 diabetes mellitus with diabetic peripheral angiopathy without gangrene: Secondary | ICD-10-CM | POA: Insufficient documentation

## 2017-09-17 DIAGNOSIS — E1169 Type 2 diabetes mellitus with other specified complication: Secondary | ICD-10-CM | POA: Insufficient documentation

## 2017-09-17 DIAGNOSIS — E11621 Type 2 diabetes mellitus with foot ulcer: Secondary | ICD-10-CM | POA: Diagnosis present

## 2017-09-18 NOTE — Progress Notes (Signed)
ARLICIA, PAQUETTE ([id number]) Visit Report for 09/17/2017 HPI Details Patient Name: Cothran, [name] Date of Service: 09/17/2017 10:15 AM Medical Record Number: [id number] Patient Account Number: [id number] Date of Birth/Sex: [birth year] (77 y.o. Female) Treating RN: Roger Shelter Primary Care Provider: Beverlyn Roux Other Clinician: Referring Provider: Beverlyn Roux Treating Provider/Extender: Tito Dine in Treatment: 37 History of Present Illness HPI Description: 08/13/16: This is a 77 year old woman who came predominantly for review of 3 cm in diameter circular wound to the left anterior lateral leg. She was in the ER on 08/01/16 I reviewed their notes. There was apparently pus coming out of the wound at that time and the patient arrived requesting debridement which they don't do in the emergency room. Nevertheless I can't see that they did any x-rays. There were no cultures done. She is a type II diabetic and I a note after the patient was in the clinic that she had a bypass graft from the popliteal to the tibial on the right on 02/28/16. She also had a right greater saphenous vein harvest on the same date for arterial bypass. She is going to have vascular studies including ABIs T ABIs on the right on 08/28/16. The patient's surgery was on 02/28/16 by Dr. Vallarie Mare she had a right below the knee popliteal artery to peroneal artery bypass with reverse greater saphenous vein and an endarterectomy of the mid segment peroneal artery. Postoperatively she had a strong mild monophasic peroneal signal with a pink foot. It would appear that the patient is had some nonhealing in the surgical saphenous vein harvest site on the left leg. Surprisingly looking through [facility] I cannot see much information about this at all. Dr. Lucious Groves notes from 05/29/16 show that the patient's wounds "are not healed" the right first metatarsal wound healed but then opened back up. 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 At that point the patient had a wound VAC to her right calf with regards to the wounds on her dorsal right toe would appear that these are felt to be arterial wounds. She has had surgery on the metatarsal phalangeal in 2015 I Dr. Doran Durand secondary to a right metatarsal phalangeal joint fracture. She is apparently had discoloration around this area since then. 08/28/16; patient arrives with her wounds in much the same condition. The linear vein harvest site and the circular wound below it which I think was a blister. She also has to probing holes in her right great toe and a necrotic eschar on the right second toe. Because of these being arterial wounds I reduced her compression from 3-2 layers this seems to of done satisfactorily she has not had any problems. I cannot see that she is actually had an x-ray ====== 11/06/16 the patient comes in for evaluation of her right lower extremity ulcers. She was here in January 2018 for 2 visits subsequently ended up in the hospital with pneumonia and then to rehabilitation. She has now been discharged from rehabilitation and is home. She has multiple ulcerations to her right lower extremity including the foot and toes. She does have home health in place and they have been placing alginate to the ulcers. She is followed by Dr. Bridgett Larsson of vascular medicine. 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 She recently saw him on 3/23. In office ABIs were:  Right 0.48 with monophasic flow to the DP, PT, peroneal Left 0.63 with monophasic flow to the DP and PT Her arterial studies indicated a patent right below knee popliteal to peroneal bypass She had an MRI in February 2008 that was negative frosty myelitis but this showed general soft tissue edema in the right foot and lower extremity  concerning for cellulitis She is a diabetic, managed with insulin. Her hemoglobin A1c in December 2017 was 8.4 which is a trend up from previous levels. She had blood work in February 2018 which revealed an albumin of 2.6 this appears to be relatively acute as an albumin in November 2017 was 3.7 11/13/16; this is a patient I have not seen since February who is readmitted to our clinic last week. 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 Bridgett Larsson. I have reviewed Dr. Lianne Moris notes from March/23/18. Doppler ABI on that date showed an ABI on the right of 0.48 and on the left of 0.63. Dorsalis pedis waveforms were monophasic bilaterally. There was no waveforms detected at the posterior tibial on the right, monophasic on the left. Dr. Geoffry Paradise, [name] ([id number]) Chen's comments were that this patient would have follow-up vascular studies in 3 months including ABIs and right lower extremity arterial duplex. She had an MRI in February that was negative for osteomyelitis but showed generalized edema in the foot. Last albumin I see was in January at 3.4 we have been using Santyl to the 4 wounds on the right leg the patient is noted today to have widespread edema well up towards her groin this is pitting 2-3+. 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 Normal ejection fraction. 11/20/16; patient has a follow-up appointment with Dr. Bridgett Larsson on April 23. She is still complaining of a lot of pain in the right foot and right leg. It is not clear to me that this is at all positional however I think it is clear claudication with minimal activity perhaps at rest. At our suggestion she is return to her primary physician's office tomorrow with regards to her pitting lower extremity bilateral edema that I reviewed in detail last week 11/27/16; the follow-up with Dr. Bridgett Larsson was actually on May 23 on April  23 as I stated in my note last week. N/A case all of her wounds seems somewhat smaller. The 2 on the right leg are definitely smaller. The areas on the dorsal right first toe, right third toe and the lateral part of the right fifth metatarsal head all looks smaller but have tightly adherent surfaces. We have been using Santyl 12/04/16; follow-up with Dr. Bridgett Larsson on May 23. 2 small open wounds on the right leg continue to get smaller. The area on the right third total lateral aspect of the right fifth toe also look better. The remaining area on the dorsal first toe still has some depth to it. We have been using Santyl to the toes and collagen on the right 12/11/16; according to patient's husband the follow-up with Dr. Bridgett Larsson is not until July. All of the wounds on the right leg are measuring smaller. We have been using some combination of Prisma and Santyl although I think we can go to straight Prisma today. There may have been some confusion with home health about the primary dressing orders here. 12/25/16; the patient has had some healing this week. The area on her right  lateral fifth metatarsal head, right third toe are both healed lower right leg is healed. In the vein harvest site superiorly she has one superficial open area. On the dorsal aspect of her right great toe/MTP joint the wound is now divided into 2 however the proximal area is deep and there is palpable bone 01/01/17; still an open area in the middle of her original right surgical scar. The area on the right third toe and right lateral fifth metatarsal head remained closed. Problematic area on the dorsal aspect of the toe. Previous surgery in this area line 01/08/17 small open area on the original scar on her upper anterior leg although this is closing. X-ray I did of the right first toe did not show underlying bony abnormality. Still this area on the dorsal first toe probes to bone. We have been using Prisma 01/15/17; small open area in the  original scar in the upper anterior leg is almost fully closed. She has 2 open areas over the dorsal aspect of the right first toe that probes to bone. Used and a form starting last week. Vigorous bone scraping that I did last week showed few methicillin sensitive staph aureus. She is allergic to penicillin and sulfa drugs. I'm going to give her 2 weeks of doxycycline. She will need an MRI with contrast. 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 She continues on doxycycline for a bone scraping that showed methicillin sensitive staph aureus [allergic to penicillin and sulfa]. We have been using Endo form to the wound. 01/29/17; surprisingly her MRI did not show osteomyelitis. She continues on doxycycline for a bone scraping that showed methicillin sensitive staph aureus with allergies to penicillin and sulfa. We have been using Endo form to the wound. Unfortunately I cannot get a surface on this visit looks like it is able to support a healing state. Proximally there is still exposed bone. There is no overt soft tissue tenderness. Her MRI did show a previous fracture was surgery to this area but no hardware 02/12/17 on evaluation today patient appears to be doing well in regard to her lower extremity wound. She does have some mild discomfort but this is minimal. There's no evidence of infection. Her left great toe nail has somewhat been lifted up and there is a little bit of slight bleeding underneath but this is still firmly attached. 02/19/17; she ran out of doxycycline 2 days ago. Nevertheless I would like to continue this for 2 weeks to make a full 6 weeks of therapy as the bone scraping that I did from the open area on the dorsal right first toe showed MRSA. This should complete antibiotic therapy. 02/26/17; the patient is completing her 6 weeks of oral doxycycline. Deep wound on the dorsal right toe. This still has  some exposed bone proximally. She does have a reasonably granulated surface albeit thin over bone. I've been using Endo form have made application for an amniotic skin sub 03/05/17; the patient has completed 6 weeks of doxycycline. This is a deep wound on the dorsal right toe which has exposed bone proximally. She has granulation over most of this wound albeit a thin layer. I've been using Endo form. Still do not have approval for Affinity 03/13/17 on evaluation today patient's right foot wound appears to be doing better measurement wise compared to her last evaluation. She has been tolerating the dressing change without complication and  does have a appointment with the dietary that has been made as far as referral is concerned there just waiting for her and transportation to contact them back for an actual date. Nonetheless patient has been having less pain at this point. No fevers, chills, nausea, or vomiting noted at this time. 03/26/17; linear wound over the dorsal aspect of her right great toe. At one point this had exposed bone on the most superior aspect however I am pleased to see today that this appears to have a surface of granulation. Apparently product was applied for but denied by [facility]. We'll need to see what that was. The patient has known PAD status post revascularization. MRI Nagele, Sharika V. ([id number]) did not show osteomyelitis which was done in June 04/02/17; continued improvement using Endoform. She was approved for Oasis but hasn't over $956 co-pay per application, this is beyond her means 04/09/17; continues on Endoform. 04/16/17; appears to be doing nicely continuing on Endoform 04/22/17; patient did not have her dressing changed all week because of the weather. Using Endoform. Base of the wound looks healthy elbow there appears to events surrounding maceration perhaps because of the drainage was not changing the wound dressing 04/30/17 wound today continues to close and except  for a small divot on the superior part of the wound. This area did not probe the bone but I found this a little concerning as this was the area with exposed bone before we be able to get this to granulate forward. As I remember things she is not a candidate for skin substitutes secondary to an outlandish co-pay. I asked her husband to look into the out of pocket max for medications if he has 1 [Regranex] or totally for skin substitutes 05/07/17; the patient arrives today with a wound roughly the same size although under careful inspection under the light there appears to be some epithelialization. Her intake nurse noted that the Endoform seemed to be placed over the wound rather than in the deeper divots they could really benefit from the Endoform. At this point I have no plans to change the Endoform as at one point this was at least 33% exposed bone and the rest of the wound very close to the underlying phalanx 05/14/17; no major change in the size or appearance of the wound. We have been using Endoform for a prolonged period of time with reasonably good improvement in the epithelialization i.e. no exposed bone especially proximally. Unless we've not made a lot of changes in the last several weeks. She does not complain of pain. She was revascularized early this year or perhaps late last year by Dr. Bridgett Larsson and I wonder if he will need to have a look at her, I will need to review the actual vascular procedure which I think was a distal popliteal bypass 05/21/17; switched to either for a blue last week. Patient arrived in clinic with the wound not measuring any better but the surface looking some better. Unfortunately she had some surface slough and when I went to remove this superiorly she had exposed bone. Previously she had had exposed bone in the inferior part of the wound however this is granulated over some weeks ago. Clearly this is a major step back for her. With some difficulty I was able to  obtain a piece of the bone probing through the superior part of the wound for culture. We did not send pathology. It is likely that she is going to need imaging of this site but  I did not order this today 05/28/17; bone culture I took last week showed MSSA. She still has exposed bone however I could not get another piece for pathology. She had an MRI 4 months ago that did not show osteoarthritic at time. Wound rapidly deteriorated superiorly and I suspect this is underlying osteomyelitis. We have been using Hydrofera Blue 06/04/17 on evaluation today patient's wound appears to actually be doing a little bit better visually compared to last week's evaluation which is good news. Nonetheless she does have osteomyelitis of the toe which does not appear to be MRSA. No fevers, chills, nausea, or vomiting noted at this time. She is having no discomfort. 06/11/17; patient's wound looks a little healthier than I remember seeing it 2 weeks ago. There is no exposed bone and the surface of the wound appears well granulated. We've been using hydrofera blue. I note that she was started on doxycycline which was MSSA. Her appointment with Dr. Ola Spurr is on November 12 I believe. 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 Vallarie Mare of vascular surgery on 11/9. He offered right leg angiography possible intervention however the patient refused. I've discussed this with the patient's husband today she did not want any further procedures. Also noteworthy that Dr. Vallarie Mare stated that this wound had not healed in 3 years, I was not aware of this degree of chronicity in this area. She has underlying osteomyelitis by bone culture. Culture of this grew MSSA, she is allergic to penicillin and therefore not a candidate for beta lactams. She saw Dr. Ola Spurr of infectious disease this morning, he continued her on doxycycline for another month and apparently sent in a prescription. We have been using  Hydrofera Blue. ABI's update by Dr. Bridgett Larsson right 0.62, left 0.89. Monophasic wave forms 06/25/17 on evaluation today patient appears to be doing well in regard to her right great toe wound. She is still taking the doxycycline as prescribed by Dr. Ola Spurr. The Hydrofera Blue Dressing's to be doing as well as anything has for her up to this point and we are still waiting on an insurance authorization for the Antares. She is having no pain which is good news. No fevers, chills, nausea, or vomiting noted at this time. 07/02/17; still taking doxycycline as prescribed by Dr. Ola Spurr. Hydrofera Blue continues. We have no palpable bone. Still have not had had approval of Apligraf 08/06/16; the patient arrives today with [facility] even though we apparently had changed to Sorbact last time. Her co-pay for Regranex is $389 in discussion with the patient and her husband this was excessive. We'll continue with the Sorbact and standard dressings for now 08/20/17; we have been using sorbact. The wound bed still requires debridement. Wound measurements are slightly better. 09/03/17;using Sorbact.no debridement today. Wound measurements slightly better. There is some discoloration of the tip of her toelooks like bruising Ode, Demiana V. ([id number]) 09/17/17; using Sorbact. No debridement today. Wound dimensions are about the same. Still some discoloration at the tip of her great toe. This does not look any worse than last time Electronic Signature(s) Signed: 09/17/2017 4:45:41 PM By: Linton Ham MD Entered By: Linton Ham on 09/17/2017 12:28:41 Rodda, Nakyah [name] ([id number]) -------------------------------------------------------------------------------- Physical Exam Details Patient Name: Buening, Marshal V. Date of Service: 09/17/2017 10:15 AM Medical Record Number: [id number] Patient Account Number: [id number] Date of Birth/Sex: 01-04-1941 (77 y.o. Female) Treating RN: Roger Shelter Primary Care  Provider: Beverlyn Roux Other Clinician: Referring Provider: Beverlyn Roux Treating Provider/Extender: Tito Dine  in Treatment: 45 Eyes Conjunctivae clear. No discharge. Respiratory Respiratory effort is easy and symmetric bilaterally. Rate is normal at rest and on room air.. Cardiovascular DP pulses are palpable. Edema present in both extremities.think her edema is multifactorial. Integumentary (Hair, Skin) no systemic rash signs of chronic venous insufficiency. Psychiatric No evidence of depression, anxiety, or agitation. Calm, cooperative, and communicative. Appropriate interactions and affect.. Notes wound exam; the patient's wound is on the dorsal first toe. There is no exposed bone but there is some depth miraculously under the light and almost looks like the surface of this wound is epithelializing even though it is still perhaps 0.4 cm deep. There is no surrounding infection Electronic Signature(s) Signed: 09/17/2017 4:45:41 PM By: Linton Ham MD Entered By: Linton Ham on 09/17/2017 12:33:06 Venhuizen, [name] ([id number]) -------------------------------------------------------------------------------- Physician Orders Details Patient Name: Koenen, Earnest V. Date of Service: 09/17/2017 10:15 AM Medical Record Number: [id number] Patient Account Number: [id number] Date of Birth/Sex: 09/15/1940 (77 y.o. Female) Treating RN: Roger Shelter Primary Care Provider: Beverlyn Roux Other Clinician: Referring Provider: Beverlyn Roux Treating Provider/Extender: Tito Dine in Treatment: 59 Verbal / Phone Orders: No Diagnosis Coding Wound Cleansing Wound #7 Right,Dorsal Metatarsal head first o Clean wound with Normal Saline. o Cleanse wound with mild soap and water Anesthetic (add to Medication List) Wound #7 Right,Dorsal Metatarsal head first o Topical Lidocaine 4% cream applied to wound bed prior to debridement (In Clinic Only). Primary Wound  Dressing Wound #7 Right,Dorsal Metatarsal head first o Other: - Sorbact Secondary Dressing Wound #7 Right,Dorsal Metatarsal head first o ABD pad o Conform/Kerlix Dressing Change Frequency Wound #7 Right,Dorsal Metatarsal head first o Change Dressing Monday, Wednesday, Friday Follow-up Appointments Wound #7 Right,Dorsal Metatarsal head first o Return Appointment in 2 weeks. Edema Control Wound #7 Right,Dorsal Metatarsal head first o Elevate legs to the level of the heart and pump ankles as often as possible Additional Orders / Instructions Wound #7 Right,Dorsal Metatarsal head first o Increase protein intake. o Activity as tolerated Home Health Wound #7 Right,Dorsal Metatarsal head first o Continue Home Health Visits - [facility] twice weekly, [facility]-Wednesday o Home Health Nurse may visit PRN to address patientos wound care needs. o FACE TO FACE ENCOUNTER: MEDICARE and MEDICAID PATIENTS: I certify that this patient is under my care and that I had a face-to-face encounter that meets the physician face-to-face encounter requirements with this Meals, Sumiko V. ([id number]) patient on this date. The encounter with the patient was in whole or in part for the following MEDICAL CONDITION: (primary reason for [place]) MEDICAL NECESSITY: I certify, that based on my findings, NURSING services are a medically necessary home health service. HOME BOUND STATUS: I certify that my clinical findings support that this patient is homebound (i.e., Due to illness or injury, pt requires aid of supportive devices such as crutches, cane, wheelchairs, walkers, the use of special transportation or the assistance of another person to leave their place of residence. There is a normal inability to leave the home and doing so requires considerable and taxing effort. Other absences are for medical reasons / religious services and are infrequent or of short duration when for  other reasons). o If current dressing causes regression in wound condition, may D/C ordered dressing product/s and apply Normal Saline Moist Dressing daily until next [place] / Other MD appointment. [place] of regression in wound condition at [phone]. o Please direct any NON-WOUND related issues/requests for orders to patient's Primary  Care Physician Medications-please add to medication list. Wound #7 Right,Dorsal Metatarsal head first o P.O. Antibiotics - continue antibiotics as prescribed Electronic Signature(s) Signed: 09/17/2017 4:11:45 PM By: Roger Shelter Signed: 09/17/2017 4:45:41 PM By: Linton Ham MD Entered By: Roger Shelter on 09/17/2017 10:55:06 Goedken, Khalessi V. ([id number]) -------------------------------------------------------------------------------- Problem List Details Patient Name: Lablanc, Aiyla V. Date of Service: 09/17/2017 10:15 AM Medical Record Number: [id number] Patient Account Number: [id number] Date of Birth/Sex: 09-08-1940 (77 y.o. 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 11/06/2016 Yes E11.52 Type 2 diabetes 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 Date of Service: 09/17/2017 10:15 AM Medical Record Number: [id number] Patient Account Number: [id number] Date of Birth/Sex: Dec 08, 1940 (77 y.o. Female) Treating RN: Roger Shelter Primary Care Provider: Beverlyn Roux Other Clinician: Referring Provider: Beverlyn Roux Treating Provider/Extender: Tito Dine in Treatment: 45 Subjective History of Present Illness (HPI) 08/13/16: This is a 77 year old woman who came predominantly for review of 3 cm in diameter circular wound to the left anterior lateral leg. She was in the ER on 08/01/16 I reviewed their notes. There was apparently pus coming out of the wound at that time and the patient arrived requesting debridement which they don't do in the emergency room. Nevertheless I can't see that they did any x-rays. There were no cultures done. She is a type II diabetic and I a note after the patient was in the clinic that she had a bypass graft from the popliteal to the tibial on the right on 02/28/16. She also had a right greater saphenous vein harvest on the same date for arterial bypass. She is going to have vascular studies including ABIs T ABIs on the right on 08/28/16. The patient's surgery was on 02/28/16 by Dr. Vallarie Mare she had a right below the knee popliteal artery to peroneal artery bypass with reverse greater saphenous vein and an endarterectomy of the mid segment peroneal artery. Postoperatively she had a strong mild monophasic peroneal signal with a pink foot. It would appear that the patient is had some nonhealing in the surgical saphenous vein  harvest site on the left leg. Surprisingly looking through [facility] I cannot see much information about this at all. Dr. Lucious Groves notes from 05/29/16 show that the patient's wounds "are not healed" the right first metatarsal wound healed but then opened back up. 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 At that point the patient had a wound VAC to her right calf with regards to the wounds on her dorsal right toe would appear that these are  felt to be arterial wounds. She has had surgery on the metatarsal phalangeal in 2015 I Dr. Doran Durand secondary to a right metatarsal phalangeal joint fracture. She is apparently had discoloration around this area since then. 08/28/16; patient arrives with her wounds in much the same condition. The linear vein harvest site and the circular wound below it which I think was a blister. She also has to probing holes in her right great toe and a necrotic eschar on the right second toe. Because of these being arterial wounds I reduced her compression from 3-2 layers this seems to of done satisfactorily she has not had any problems. I cannot see that she is actually had an x-ray ====== 11/06/16 the patient comes in for evaluation of her right lower extremity ulcers. She was here in January 2018 for 2 visits subsequently ended up in the hospital with pneumonia and then to rehabilitation. She has now been discharged from rehabilitation and is home. She has multiple ulcerations to her right lower extremity including the foot and toes. She does have home health in place and they have been placing alginate to the ulcers. She is followed by Dr. Bridgett Larsson of vascular medicine. 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 She recently saw him on 3/23. In office ABIs were: Right 0.48 with monophasic flow to the DP,  PT, peroneal Left 0.63 with monophasic flow to the DP and PT Her arterial studies indicated a patent right below knee popliteal to peroneal bypass She had an MRI in February 2008 that was negative frosty myelitis but this showed general soft tissue edema in the right foot and lower extremity concerning for cellulitis She is a diabetic, managed with insulin. Her hemoglobin A1c in December 2017 was 8.4 which is a trend up from previous levels. She had blood work in February 2018 which revealed an albumin of 2.6 this appears to be relatively acute as an albumin in November 2017 was 3.7 11/13/16; this is a patient I have not seen since February who is readmitted to our clinic last week. 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 Bridgett Larsson. I have reviewed Dr. Lianne Moris notes from March/23/18. Doppler ABI on that date showed an ABI on the right of 0.48 and on the left of 0.63. Dorsalis pedis waveforms were monophasic bilaterally. There was no waveforms detected at the posterior tibial on the right, monophasic on the left. Dr. Lianne Moris comments were that this patient would have follow-up vascular studies in 3 months including ABIs and right lower extremity arterial duplex. She had an MRI in February that was negative for osteomyelitis but showed generalized edema in Danek, Shayona V. ([id number]) the foot. Last albumin I see was in January at 3.4 we have been using Santyl to the 4 wounds on the right leg the patient is noted today to have widespread edema well up towards her groin this is pitting 2-3+. 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 Normal ejection fraction. 11/20/16; patient has a follow-up appointment with Dr. Bridgett Larsson on April 23. She is still complaining of a lot of pain in the right foot and right leg. It is not clear to me that this is at all positional however I think it is  clear claudication with minimal activity perhaps at rest. At our suggestion she is return  to her primary physician's office tomorrow with regards to her pitting lower extremity bilateral edema that I reviewed in detail last week 11/27/16; the follow-up with Dr. Bridgett Larsson was actually on May 23 on April 23 as I stated in my note last week. N/A case all of her wounds seems somewhat smaller. The 2 on the right leg are definitely smaller. The areas on the dorsal right first toe, right third toe and the lateral part of the right fifth metatarsal head all looks smaller but have tightly adherent surfaces. We have been using Santyl 12/04/16; follow-up with Dr. Bridgett Larsson on May 23. 2 small open wounds on the right leg continue to get smaller. The area on the right third total lateral aspect of the right fifth toe also look better. The remaining area on the dorsal first toe still has some depth to it. We have been using Santyl to the toes and collagen on the right 12/11/16; according to patient's husband the follow-up with Dr. Bridgett Larsson is not until July. All of the wounds on the right leg are measuring smaller. We have been using some combination of Prisma and Santyl although I think we can go to straight Prisma today. There may have been some confusion with home health about the primary dressing orders here. 12/25/16; the patient has had some healing this week. The area on her right lateral fifth metatarsal head, right third toe are both healed lower right leg is healed. In the vein harvest site superiorly she has one superficial open area. On the dorsal aspect of her right great toe/MTP joint the wound is now divided into 2 however the proximal area is deep and there is palpable bone 01/01/17; still an open area in the middle of her original right surgical scar. The area on the right third toe and right lateral fifth metatarsal head remained closed. Problematic area on the dorsal aspect of the toe. Previous surgery in this area  line 01/08/17 small open area on the original scar on her upper anterior leg although this is closing. X-ray I did of the right first toe did not show underlying bony abnormality. Still this area on the dorsal first toe probes to bone. We have been using Prisma 01/15/17; small open area in the original scar in the upper anterior leg is almost fully closed. She has 2 open areas over the dorsal aspect of the right first toe that probes to bone. Used and a form starting last week. Vigorous bone scraping that I did last week showed few methicillin sensitive staph aureus. She is allergic to penicillin and sulfa drugs. I'm going to give her 2 weeks of doxycycline. She will need an MRI with contrast. 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 She continues on doxycycline for a bone scraping that showed methicillin sensitive staph aureus [allergic to penicillin and sulfa]. We have been using Endo form to the wound. 01/29/17; surprisingly her MRI did not show osteomyelitis. She continues on doxycycline for a bone scraping that showed methicillin sensitive staph aureus with allergies to penicillin and sulfa. We have been using Endo form to the wound. Unfortunately I cannot get a surface on this visit looks like it is able to support a healing state. Proximally there is still exposed bone. There is no overt soft tissue tenderness. Her MRI did show a previous fracture was surgery to this area but no hardware 02/12/17 on evaluation  today patient appears to be doing well in regard to her lower extremity wound. She does have some mild discomfort but this is minimal. There's no evidence of infection. Her left great toe nail has somewhat been lifted up and there is a little bit of slight bleeding underneath but this is still firmly attached. 02/19/17; she ran out of doxycycline 2 days ago. Nevertheless I would like to continue this for 2 weeks  to make a full 6 weeks of therapy as the bone scraping that I did from the open area on the dorsal right first toe showed MRSA. This should complete antibiotic therapy. 02/26/17; the patient is completing her 6 weeks of oral doxycycline. Deep wound on the dorsal right toe. This still has some exposed bone proximally. She does have a reasonably granulated surface albeit thin over bone. I've been using Endo form have made application for an amniotic skin sub 03/05/17; the patient has completed 6 weeks of doxycycline. This is a deep wound on the dorsal right toe which has exposed bone proximally. She has granulation over most of this wound albeit a thin layer. I've been using Endo form. Still do not have approval for Affinity 03/13/17 on evaluation today patient's right foot wound appears to be doing better measurement wise compared to her last evaluation. She has been tolerating the dressing change without complication and does have a appointment with the dietary that has been made as far as referral is concerned there just waiting for her and transportation to contact them back for an actual date. Nonetheless patient has been having less pain at this point. No fevers, chills, nausea, or vomiting noted at this time. 03/26/17; linear wound over the dorsal aspect of her right great toe. At one point this had exposed bone on the most superior aspect however I am pleased to see today that this appears to have a surface of granulation. Apparently product was applied for but denied by [facility]. We'll need to see what that was. The patient has known PAD status post revascularization. MRI did not show osteomyelitis which was done in June 04/02/17; continued improvement using Endoform. She was approved for Oasis but hasn't over $846 co-pay per application, Droz, Kenitha V. ([id number]) this is beyond her means 04/09/17; continues on Endoform. 04/16/17; appears to be doing nicely continuing on Endoform 04/22/17; patient  did not have her dressing changed all week because of the weather. Using Endoform. Base of the wound looks healthy elbow there appears to events surrounding maceration perhaps because of the drainage was not changing the wound dressing 04/30/17 wound today continues to close and except for a small divot on the superior part of the wound. This area did not probe the bone but I found this a little concerning as this was the area with exposed bone before we be able to get this to granulate forward. As I remember things she is not a candidate for skin substitutes secondary to an outlandish co-pay. I asked her husband to look into the out of pocket max for medications if he has 1 [Regranex] or totally for skin substitutes 05/07/17; the patient arrives today with a wound roughly the same size although under careful inspection under the light there appears to be some epithelialization. Her intake nurse noted that the Endoform seemed to be placed over the wound rather than in the deeper divots they could really benefit from the Endoform. At this point I have no plans to change the Endoform as at one point this  was at least 33% exposed bone and the rest of the wound very close to the underlying phalanx 05/14/17; no major change in the size or appearance of the wound. We have been using Endoform for a prolonged period of time with reasonably good improvement in the epithelialization i.e. no exposed bone especially proximally. Unless we've not made a lot of changes in the last several weeks. She does not complain of pain. She was revascularized early this year or perhaps late last year by Dr. Bridgett Larsson and I wonder if he will need to have a look at her, I will need to review the actual vascular procedure which I think was a distal popliteal bypass 05/21/17; switched to either for a blue last week. Patient arrived in clinic with the wound not measuring any better but the surface looking some better. Unfortunately she  had some surface slough and when I went to remove this superiorly she had exposed bone. Previously she had had exposed bone in the inferior part of the wound however this is granulated over some weeks ago. Clearly this is a major step back for her. With some difficulty I was able to obtain a piece of the bone probing through the superior part of the wound for culture. We did not send pathology. It is likely that she is going to need imaging of this site but I did not order this today 05/28/17; bone culture I took last week showed MSSA. She still has exposed bone however I could not get another piece for pathology. She had an MRI 4 months ago that did not show osteoarthritic at time. Wound rapidly deteriorated superiorly and I suspect this is underlying osteomyelitis. We have been using Hydrofera Blue 06/04/17 on evaluation today patient's wound appears to actually be doing a little bit better visually compared to last week's evaluation which is good news. Nonetheless she does have osteomyelitis of the toe which does not appear to be MRSA. No fevers, chills, nausea, or vomiting noted at this time. She is having no discomfort. 06/11/17; patient's wound looks a little healthier than I remember seeing it 2 weeks ago. There is no exposed bone and the surface of the wound appears well granulated. We've been using hydrofera blue. I note that she was started on doxycycline which was MSSA. Her appointment with Dr. Ola Spurr is on November 12 I believe. 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 Vallarie Mare of vascular surgery on 11/9. He offered right leg angiography possible intervention however the patient refused. I've discussed this with the patient's husband today she did not want any further procedures. Also noteworthy that Dr. Vallarie Mare stated that this wound had not healed in 3 years, I was not aware of this degree of chronicity in this area. She has underlying osteomyelitis by bone  culture. Culture of this grew MSSA, she is allergic to penicillin and therefore not a candidate for beta lactams. She saw Dr. Ola Spurr of infectious disease this morning, he continued her on doxycycline for another month and apparently sent in a prescription. We have been using Hydrofera Blue. ABI's update by Dr. Bridgett Larsson right 0.62, left 0.89. Monophasic wave forms 06/25/17 on evaluation today patient appears to be doing well in regard to her right great toe wound. She is still taking the doxycycline as prescribed by Dr. Ola Spurr. The Hydrofera Blue Dressing's to be doing as well as anything has for her up to this point and we are still waiting on an insurance authorization for the [place].  She is having no pain which is good news. No fevers, chills, nausea, or vomiting noted at this time. 07/02/17; still taking doxycycline as prescribed by Dr. Ola Spurr. Hydrofera Blue continues. We have no palpable bone. Still have not had had approval of Apligraf 08/06/16; the patient arrives today with [facility] even though we apparently had changed to Sorbact last time. Her co-pay for Regranex is $389 in discussion with the patient and her husband this was excessive. We'll continue with the Sorbact and standard dressings for now 08/20/17; we have been using sorbact. The wound bed still requires debridement. Wound measurements are slightly better. 09/03/17;using Sorbact.no debridement today. Wound measurements slightly better. There is some discoloration of the tip of her toelooks like bruising 09/17/17; using Sorbact. No debridement today. Wound dimensions are about the same. Still some discoloration at the tip of her great toe. This does not look any worse than last time Preziosi, Mckinnley V. ([id number]) Objective Constitutional Vitals Time Taken: 10:34 AM, Height: 64 in, Weight: 200 lbs, BMI: 34.3, Temperature: 97.6 F, Pulse: 72 bpm, Respiratory Rate: 18 breaths/min, Blood Pressure: 139/52  mmHg. Eyes Conjunctivae clear. No discharge. Respiratory Respiratory effort is easy and symmetric bilaterally. Rate is normal at rest and on room air.. Cardiovascular DP pulses are palpable. Edema present in both extremities.think her edema is multifactorial. Psychiatric No evidence of depression, anxiety, or agitation. Calm, cooperative, and communicative. Appropriate interactions and affect.. General Notes: wound exam; the patient's wound is on the dorsal first toe. There is no exposed bone but there is some depth miraculously under the light and almost looks like the surface of this wound is epithelializing even though it is still perhaps 0.4 cm deep. There is no surrounding infection Integumentary (Hair, Skin) no systemic rash signs of chronic venous insufficiency. Wound #7 status is Open. Original cause of wound was Gradually Appeared. The wound is located on the Right,Dorsal Metatarsal head first. The wound measures 1.4cm length x 3.5cm width x 0.4cm depth; 3.848cm^2 area and 1.539cm^3 volume. There is Fat Layer (Subcutaneous Tissue) Exposed exposed. There is no tunneling or undermining noted. There is a medium amount of serous drainage noted. The wound margin is flat and intact. There is medium (34-66%) red granulation within the wound bed. There is a medium (34-66%) amount of necrotic tissue within the wound bed including Adherent Slough. The periwound skin appearance exhibited: Induration, Hemosiderin Staining. The periwound skin appearance did not exhibit: Callus, Crepitus, Excoriation, Rash, Scarring, Dry/Scaly, Maceration, Atrophie Blanche, Cyanosis, Ecchymosis, Mottled, Pallor, Rubor, Erythema. Periwound temperature was noted as No Abnormality. The periwound has tenderness on palpation. 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 Cleanse wound with mild soap and water Anesthetic (add to Medication List): Wound #7 Right,Dorsal Metatarsal head first: Topical Lidocaine 4% cream applied to wound bed prior to debridement (In Clinic Only). Primary Wound Dressing: Wound #7 Right,Dorsal Metatarsal  head first: Other: - Sorbact Secondary Dressing: Wound #7 Right,Dorsal Metatarsal head first: ABD pad Conform/Kerlix Dressing Change Frequency: Wound #7 Right,Dorsal Metatarsal head first: Change Dressing Monday, Wednesday, Friday Follow-up Appointments: Wound #7 Right,Dorsal Metatarsal head first: Return Appointment in 2 weeks. Edema Control: Wound #7 Right,Dorsal Metatarsal head first: Elevate legs to the level of the heart and pump ankles as often as possible Additional Orders / Instructions: Wound #7 Right,Dorsal Metatarsal head first: Increase protein intake. Activity as tolerated Home Health: Wound #7 Right,Dorsal Metatarsal head first: [facility] twice weekly, [facility] Home Health Nurse may visit PRN to address patient s wound care needs. FACE TO FACE ENCOUNTER: MEDICARE and MEDICAID PATIENTS: I certify that this patient is under my care and that I had a face-to-face encounter that meets the physician face-to-face encounter requirements with this patient on this date. The encounter with the patient was  in whole or in part for the following MEDICAL CONDITION: (primary reason for [place]) MEDICAL NECESSITY: I certify, that based on my findings, NURSING services are a medically necessary home health service. HOME BOUND STATUS: I certify that my clinical findings support that this patient is homebound (i.e., Due to illness or injury, pt requires aid of supportive devices such as crutches, cane, wheelchairs, walkers, the use of special transportation or the assistance of another person to leave their place of residence. There is a normal inability to leave the home and doing so requires considerable and taxing effort. Other absences are for medical reasons / religious services and are infrequent or of short duration when for other reasons). If current dressing causes regression in wound condition, may D/C ordered dressing product/s and apply Normal Saline Moist Dressing daily until next [place] / Other MD appointment. [place] of regression in wound condition at [phone]. Please direct any NON-WOUND related issues/requests for orders to patient's Primary Care Physician Medications-please add to medication list.: Wound #7 Right,Dorsal Metatarsal head first: P.O. Antibiotics - continue antibiotics as prescribed Naumann, Elizaveta V. ([id number]) #1 I think we're going to continue the Sorbact hydrogel for the next 2 weeks #2 careful inspection of the wound base. It is uncommon for wounds to epithelialize with depth however if that's what is going to happen here it would be acceptable. She is Lexicographer) Signed: 09/17/2017 4:45:41 PM By: Linton Ham MD Entered By: Linton Ham on 09/17/2017 12:34:21 Babic, Shiree [name] ([id number]) -------------------------------------------------------------------------------- SuperBill Details Patient Name: Gural, Pascale V. Date of Service: 09/17/2017 Medical Record Number: [id number] Patient Account  Number: [id number] Date of Birth/Sex: May 09, 1941 (77 y.o. 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 I70.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, right ankle and foot Facility Procedures CPT4 Code: 55732202 Description: 54270 - WOUND CARE VISIT-LEV 2 EST PT Modifier: Quantity: 1 Physician Procedures CPT4 Code Description: [id number] - WC PHYS LEVEL 3 - EST PT ICD-10 Diagnosis Description  E11.621 Type 2 diabetes mellitus with foot ulcer L97.519 Non-pressure chronic ulcer of other part of right foot with unspe Modifier: cified severit Quantity: 1 y Engineer, maintenance) Signed: 09/17/2017 4:45:41 PM By: Linton Ham MD Entered By: Linton Ham on 09/17/2017 12:34:42

## 2017-09-18 NOTE — Progress Notes (Signed)
TYKEISHA, PEER ([id number]) Visit Report for 09/17/2017 Arrival Information Details Patient Name: [name], [name] Date of Service: 09/17/2017 10:15 AM Medical Record Number: [id number] Patient Account Number: [id number] Date of Birth/Sex: 11-25-1940 (77 y.o. Female) Treating RN: [name] Primary Care [name]: [name] Other Clinician: Referring [name]: [name] Treating [name]/Extender: [name] in Treatment: 57 Visit Information History Since Last Visit All ordered tests and consults were completed: No Patient Arrived: Wheel Chair Added or deleted any medications: Yes Arrival Time: 10:33 Had a fall or experienced change in No Accompanied By: husband activities of daily living that may affect Transfer Assistance: EasyPivot Patient risk of falls: Lift Signs or symptoms of abuse/neglect since last visito No Patient Identification Verified: Yes Hospitalized since last visit: No Secondary Verification Process Yes Pain Present Now: Yes Completed: Patient Requires Transmission-Based No Precautions: Patient Has Alerts: No Electronic Signature(s) Signed: 09/17/2017 4:11:45 PM By: [name] Entered By: [name] on 09/17/2017 10:33:37 [name] ([id number]) -------------------------------------------------------------------------------- Clinic Level of Care Assessment Details Patient Name: [name], [name] Date of Service: 09/17/2017 10:15 AM Medical Record Number: [id number] Patient Account Number: [id number] Date of Birth/Sex: March 15, 1941 (77 y.o. Female) Treating RN: [name] Primary Care [name]: [name] Other Clinician: Referring [name]: [name] Treating [name]/Extender: [name] in Treatment: 11 Clinic Level of Care Assessment Items TOOL 4 Quantity Score []  - Use when only an EandM is performed on FOLLOW-UP visit 0 ASSESSMENTS - Nursing Assessment / Reassessment X - Reassessment of  Co-morbidities (includes updates in patient status) 1 10 X- 1 5 Reassessment of Adherence to Treatment Plan ASSESSMENTS - Wound and Skin Assessment / Reassessment X - Simple Wound Assessment / Reassessment - one wound 1 5 []  - 0 Complex Wound Assessment / Reassessment - multiple wounds []  - 0 Dermatologic / Skin Assessment (not related to wound area) ASSESSMENTS - Focused Assessment []  - Circumferential Edema Measurements - multi extremities 0 []  - 0 Nutritional Assessment / Counseling / Intervention []  - 0 Lower Extremity Assessment (monofilament, tuning fork, pulses) []  - 0 Peripheral Arterial Disease Assessment (using hand held doppler) ASSESSMENTS - Ostomy and/or Continence Assessment and Care []  - Incontinence Assessment and Management 0 []  - 0 Ostomy Care Assessment and Management (repouching, etc.) PROCESS - Coordination of Care X - Simple Patient / Family Education for ongoing care 1 15 []  - 0 Complex (extensive) Patient / Family Education for ongoing care []  - 0 Staff obtains Programmer, systems, Records, Test Results / Process Orders []  - 0 Staff telephones HHA, Nursing Homes / Clarify orders / etc []  - 0 Routine Transfer to another Facility (non-emergent condition) []  - 0 Routine Hospital Admission (non-emergent condition) []  - 0 New Admissions / Biomedical engineer / Ordering NPWT, Apligraf, etc. []  - 0 Emergency Hospital Admission (emergent condition) X- 1 10 Simple Discharge Coordination White, [name] ([id number]) []  - 0 Complex (extensive) Discharge Coordination PROCESS - Special Needs []  - Pediatric / Minor Patient Management 0 []  - 0 Isolation Patient Management []  - 0 Hearing / Language / Visual special needs []  - 0 Assessment of Community assistance (transportation, D/C planning, etc.) []  - 0 Additional assistance / Altered mentation []  - 0 Support Surface(s) Assessment (bed, cushion, seat, etc.) INTERVENTIONS - Wound Cleansing / Measurement X -  Simple Wound Cleansing - one wound 1 5 []  - 0 Complex Wound Cleansing - multiple wounds X- 1 5 Wound Imaging (photographs - any number of wounds) []  - 0 Wound Tracing (instead of  photographs) X- 1 5 Simple Wound Measurement - one wound []  - 0 Complex Wound Measurement - multiple wounds INTERVENTIONS - Wound Dressings X - Small Wound Dressing one or multiple wounds 1 10 []  - 0 Medium Wound Dressing one or multiple wounds []  - 0 Large Wound Dressing one or multiple wounds []  - 0 Application of Medications - topical []  - 0 Application of Medications - injection INTERVENTIONS - Miscellaneous []  - External ear exam 0 []  - 0 Specimen Collection (cultures, biopsies, blood, body fluids, etc.) []  - 0 Specimen(s) / Culture(s) sent or taken to Lab for analysis []  - 0 Patient Transfer (multiple staff / Civil Service fast streamer / Similar devices) []  - 0 Simple Staple / Suture removal (25 or less) []  - 0 Complex Staple / Suture removal (26 or more) []  - 0 Hypo / Hyperglycemic Management (close monitor of Blood Glucose) []  - 0 Ankle / Brachial Index (ABI) - do not check if billed separately X- 1 5 Vital Signs [name], [name] ([id number]) Has the patient been seen at the hospital within the last three years: Yes Total Score: 75 Level Of Care: New/Established - Level 2 Electronic Signature(s) Signed: 09/17/2017 4:11:45 PM By: [name] Entered By: [name] on 09/17/2017 11:10:57 [name], [name] ([id number]) -------------------------------------------------------------------------------- Encounter Discharge Information Details Patient Name: [name], [name] Date of Service: 09/17/2017 10:15 AM Medical Record Number: [id number] Patient Account Number: [id number] Date of Birth/Sex: 1941/04/03 (77 y.o. Female) Treating RN: [name] Primary Care [name]: [name] Other Clinician: Referring [name]: [name] Treating [name]/Extender: [name] in  Treatment: 4 Encounter Discharge Information Items Schedule Follow-up Appointment: No Medication Reconciliation completed and No provided to Patient/Care Arlin Sass: Provided on Clinical Summary of Care: 09/17/2017 Form Type Recipient Paper Patient [facility] Electronic Signature(s) Signed: 09/17/2017 4:05:31 PM By: Ruthine Dose Entered By: Ruthine Dose on 09/17/2017 11:13:20 [name], [name] ([id number]) -------------------------------------------------------------------------------- Lower Extremity Assessment Details Patient Name: [name], [name] Date of Service: 09/17/2017 10:15 AM Medical Record Number: [id number] Patient Account Number: [id number] Date of Birth/Sex: 1940-12-07 (77 y.o. Female) Treating RN: [name] Primary Care Lenisha Lacap: [name] Other Clinician: Referring Ernesto Lashway: [name] Treating Sabastian Raimondi/Extender: [name] in Treatment: 45 Edema Assessment Assessed: [Left: No] [Right: No] Edema: [Left: Yes] [Right: Yes] Vascular Assessment Claudication: Claudication Assessment [Left:None] [Right:None] Pulses: Dorsalis Pedis Palpable: [Left:No] [Right:No] Doppler Audible: [Left:Yes] [Right:Yes] Posterior Tibial Extremity colors, hair growth, and conditions: Extremity Color: [Left:Normal] [Right:Hyperpigmented] Hair Growth on Extremity: [Left:No] [Right:No] Temperature of Extremity: [Left:Cool] [Right:Cool] Capillary Refill: [Right:< 3 seconds] Toe Nail Assessment Left: Right: Thick: Yes Yes Discolored: Yes Yes Deformed: Yes Yes Improper Length and Hygiene: Yes Yes Electronic Signature(s) Signed: 09/17/2017 4:11:45 PM By: [name] Entered By: [name] on 09/17/2017 10:50:31 Noseworthy, Cythnia V. ([id number]) -------------------------------------------------------------------------------- Multi Wound Chart Details Patient Name: [name], [name] Date of Service: 09/17/2017 10:15 AM Medical Record Number: [id number] Patient Account  Number: [id number] Date of Birth/Sex: Oct 25, 1940 (77 y.o. 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] Comorbid History: [7:Cataracts, Chronic sinus problems/congestion, Congestive Heart Failure, Hypertension, Type II Diabetes] [N/A:N/A] Date Acquired: [7:08/06/2013] [N/A:N/A] Weeks of  Treatment: [7:45] [N/A:N/A] Wound Status: [7:Open] [N/A:N/A] Measurements L x W x D [7:1.4x3.5x0.4] [N/A:N/A] (cm) Area (cm) : [7:3.848] [N/A:N/A] Volume (cm) : [7:1.539] [N/A:N/A] % Reduction in Area: [7:-226.70%] [N/A:N/A] % Reduction in Volume: [7:-336.00%] [N/A:N/A] Classification: [7:Grade 2] [N/A:N/A] Exudate Amount: [7:Medium] [N/A:N/A] Exudate Type: [7:Serous] [N/A:N/A] Exudate Color: [7:amber] [N/A:N/A] Wound Margin: [7:Flat and Intact] [N/A:N/A] Granulation Amount: [7:Medium (34-66%)] [N/A:N/A] Granulation Quality: [7:Red] [N/A:N/A] Necrotic Amount: [7:Medium (34-66%)] [N/A:N/A] Exposed Structures: [7:Fat Layer (Subcutaneous Tissue) Exposed: Yes Fascia: No Tendon: No Muscle: No Joint: No Bone: No] [N/A:N/A] Epithelialization: [7:Small (1-33%)] [N/A:N/A] Periwound Skin Texture: [7:Induration: Yes Excoriation: No] [N/A:N/A] Callus: No Crepitus: No Rash: No Scarring: No Periwound Skin Moisture: Maceration: No N/A N/A Dry/Scaly: No Periwound Skin Color: Hemosiderin Staining: Yes N/A N/A Atrophie Blanche: No Cyanosis: No Ecchymosis: No Erythema:  No Mottled: No Pallor: No Rubor: No Temperature: No Abnormality N/A N/A Tenderness on Palpation: Yes N/A N/A Wound Preparation: Ulcer Cleansing: N/A N/A Rinsed/Irrigated with Saline Topical Anesthetic Applied: Other: lidocaine 4% Treatment Notes Electronic Signature(s) Signed: 09/17/2017 4:45:41 PM By: Linton Ham MD Entered By: Linton Ham on 09/17/2017 12:27:54 [name], [name] ([id number]) -------------------------------------------------------------------------------- Multi-Disciplinary Care Plan Details Patient Name: [name], [name] Date of Service: 09/17/2017 10:15 AM Medical Record Number: [id number] Patient Account Number: [id number] Date of Birth/Sex: Jan 21, 1941 (77 y.o. Female) Treating RN: [name] Primary Care Mauricio Dahlen: [name] Other Clinician: Referring Cletus Mehlhoff: [name] Treating Brodi Nery/Extender: [name] in Treatment: 45 Active Inactive ` Abuse / Safety / Falls / Self Care Management Nursing Diagnoses: Impaired physical mobility Potential for falls Goals: Patient will remain injury free Date Initiated: 11/06/2016 Target Resolution Date: 12/30/2016 Goal Status: Active Patient/caregiver will verbalize understanding of skin care regimen Date Initiated: 11/06/2016 Target Resolution Date: 12/30/2016 Goal Status: Active Interventions: Assess fall risk on admission and as needed Treatment Activities: Patient referred to home care : 11/06/2016 Notes: ` Nutrition Nursing Diagnoses: Potential for alteratiion in Nutrition/Potential for imbalanced nutrition Goals: Patient/caregiver verbalizes understanding of need to maintain therapeutic glucose control per primary care physician Date Initiated: 11/06/2016 Target Resolution Date: 12/30/2016 Goal Status: Active Interventions: Provide education on elevated blood sugars and impact on wound healing Notes: ` Orientation to the Wound Care Program Nursing Diagnoses: Knowledge deficit  related to the wound healing center program [name], [name] ([id number]) Goals: Patient/caregiver will verbalize understanding of the [place] Date Initiated: 11/06/2016 Target Resolution Date: 12/30/2016 Goal Status: Active Interventions: Provide education on orientation to the wound center Notes: ` Venous Leg Ulcer Nursing Diagnoses: Actual venous Insuffiency (use after diagnosis is confirmed) Knowledge deficit related to disease process and management Goals: Non-invasive venous studies are completed as ordered Date Initiated: 11/06/2016 Target Resolution Date: 12/30/2016 Goal Status: Active Patient/caregiver will verbalize understanding of disease process and disease management Date Initiated: 11/06/2016 Target Resolution Date: 12/30/2016 Goal Status: Active Interventions: Assess peripheral edema status every visit. Notes: ` Wound/Skin Impairment Nursing Diagnoses: Impaired tissue integrity Knowledge deficit related to smoking impact on wound healing Knowledge deficit related to ulceration/compromised skin integrity Goals: Ulcer/skin breakdown will heal within 14 weeks Date Initiated: 11/06/2016 Target Resolution Date: 02/05/2017 Goal Status: Active Interventions: Assess ulceration(s) every visit Treatment Activities: Skin care regimen initiated : 11/06/2016 Notes: Electronic Signature(s) Signed: 09/17/2017 4:11:45 PM By: Denzil Hughes ([id number]) Entered By: [name] on 09/17/2017 10:51:08 [name], [name] ([id number]) -------------------------------------------------------------------------------- Pain Assessment Details Patient Name: [name], [name] V. Date of Service: 09/17/2017 10:15 AM Medical Record Number: [id number] Patient Account Number: [id number] Date of  Birth/Sex: Oct 07, 1940 (77 y.o. Female) Treating RN: [name] Primary Care Monay Houlton: [name] Other Clinician: Referring Dainel Arcidiacono: [name] Treating  Charles Andringa/Extender: [name] in Treatment: 45 Active Problems Location of Pain Severity and Description of Pain Patient Has Paino Yes Site Locations Rate the pain. Current Pain Level: 5 Character of Pain Describe the Pain: Aching Pain Management and Medication Current Pain Management: Electronic Signature(s) Signed: 09/17/2017 4:11:45 PM By: [name] Entered By: [name] on 09/17/2017 10:33:58 Urieta, Yvonda V. ([id number]) -------------------------------------------------------------------------------- Wound Assessment Details Patient Name: Kinder, Donnice V. Date of Service: 09/17/2017 10:15 AM Medical Record Number: [id number] Patient Account Number: [id number] Date of Birth/Sex: 1941-05-26 (77 y.o. Female) Treating RN: [name] Primary Care Nayleen Janosik: [name] Other Clinician: Referring Ellesse Antenucci: [name] Treating Lucerito Rosinski/Extender: [name] in Treatment: 97 Wound Status Wound Number: 7 Primary Diabetic Wound/Ulcer of the Lower Extremity Etiology: Wound Location: Right Metatarsal head first - Dorsal Wound Open Wounding Event: Gradually Appeared Status: Date Acquired: 08/06/2013 Comorbid Cataracts, Chronic sinus problems/congestion, Weeks Of Treatment: 45 History: Congestive Heart Failure, Hypertension, Type II Clustered Wound: No Diabetes Photos Photo Uploaded By: [name] on 09/17/2017 16:19:21 Wound Measurements Length: (cm) 1.4 Width: (cm) 3.5 Depth: (cm) 0.4 Area: (cm) 3.848 Volume: (cm) 1.539 % Reduction in Area: -226.7% % Reduction in Volume: -336% Epithelialization: Small (1-33%) Tunneling: No Undermining: No Wound Description Classification: Grade 2 Wound Margin: Flat and Intact Exudate Amount: Medium Exudate Type: Serous Exudate Color: amber Foul Odor After Cleansing: No Slough/Fibrino Yes Wound Bed Granulation Amount: Medium (34-66%) Exposed Structure Granulation Quality:  Red Fascia Exposed: No Necrotic Amount: Medium (34-66%) Fat Layer (Subcutaneous Tissue) Exposed: Yes Necrotic Quality: Adherent Slough Tendon Exposed: No Muscle Exposed: No Joint Exposed: No Bone Exposed: No Periwound Skin Texture [place], Athziri V. ([id number]) Texture Color No Abnormalities Noted: No No Abnormalities Noted: No Callus: No Atrophie Blanche: No Crepitus: No Cyanosis: No Excoriation: No Ecchymosis: No Induration: Yes Erythema: No Rash: No Hemosiderin Staining: Yes Scarring: No Mottled: No Pallor: No Moisture Rubor: No No Abnormalities Noted: No Dry / Scaly: No Temperature / Pain Maceration: No Temperature: No Abnormality Tenderness on Palpation: Yes Wound Preparation Ulcer Cleansing: Rinsed/Irrigated with Saline Topical Anesthetic Applied: Other: lidocaine 4%, Electronic Signature(s) Signed: 09/17/2017 4:11:45 PM By: [name] Entered By: [name] on 09/17/2017 10:45:10 Gruber, Kamani V. ([id number]) -------------------------------------------------------------------------------- Vitals Details Patient Name: Tanimoto, Kristyanna V. Date of Service: 09/17/2017 10:15 AM Medical Record Number: [id number] Patient Account Number: [id number] Date of Birth/Sex: 1940/09/09 (77 y.o. Female) Treating RN: [name] Primary Care Kathrin Folden: [name] Other Clinician: Referring Jiali Linney: [name] Treating Averey Trompeter/Extender: [name] in Treatment: 45 Vital Signs Time Taken: 10:34 Temperature (F): 97.6 Height (in): 64 Pulse (bpm): 72 Weight (lbs): 200 Respiratory Rate (breaths/min): 18 Body Mass Index (BMI): 34.3 Blood Pressure (mmHg): 139/52 Reference Range: 80 - 120 mg / dl Electronic Signature(s) Signed: 09/17/2017 4:11:45 PM By: [name] Entered By: [name] on 09/17/2017 10:34:27

## 2017-10-01 ENCOUNTER — Encounter: Payer: Medicare HMO | Admitting: Internal Medicine

## 2017-10-01 DIAGNOSIS — E11621 Type 2 diabetes mellitus with foot ulcer: Secondary | ICD-10-CM | POA: Diagnosis not present

## 2017-10-02 NOTE — Progress Notes (Signed)
[name], [name] ([id number]) Visit Report for 10/01/2017 HPI Details Patient Name: [name] [name] SPIZZIRRI. Date of Service: 10/01/2017 10:00 AM Medical Record Number: [id number] Patient Account Number: [id number] Date of Birth/Sex: 1940/12/31 (77 y.o. Female) Treating RN: Cornell Barman Primary Care Provider: Beverlyn Roux Other Clinician: Referring Provider: Beverlyn Roux Treating Provider/Extender: Tito Dine in Treatment: 46 History of Present Illness HPI Description: 08/13/16: This is a 77 year old woman who came predominantly for review of 3 cm in diameter circular wound to the left anterior lateral leg. She was in the ER on 08/01/16 I reviewed their notes. There was apparently pus coming out of the wound at that time and the patient arrived requesting debridement which they don't do in the emergency room. Nevertheless I can't see that they did any x-rays. There were no cultures done. She is a type II diabetic and I a note after the patient was in the clinic that she had a bypass graft from the popliteal to the tibial on the right on 02/28/16. She also had a right greater saphenous vein harvest on the same date for arterial bypass. She is going to have vascular studies including ABIs T ABIs on the right on 08/28/16. The patient's surgery was on 02/28/16 by Dr. Vallarie Mare she had a right below the knee popliteal artery to peroneal artery bypass with reverse greater saphenous vein and an endarterectomy of the mid segment peroneal artery. Postoperatively she had a strong mild monophasic peroneal signal with a pink foot. It would appear that the patient is had some nonhealing in the surgical saphenous vein harvest site on the left leg. Surprisingly looking [facility] I cannot see much information about this at all. Dr. Lucious Groves notes from 05/29/16 show that the patient's wounds "are not healed" the right first metatarsal wound healed but then opened back up. 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 At that point the patient had a wound VAC to her right calf with regards to the wounds on her dorsal right toe would appear that these are felt to be arterial wounds. She has had surgery on the metatarsal phalangeal in 2015 I Dr. Doran Durand secondary to a right metatarsal phalangeal joint fracture. She is apparently had discoloration around this area since then. 08/28/16; patient arrives with her wounds in much the same condition. The linear vein harvest site and the circular wound below it which I think was a blister. She also has to probing holes in her right great toe and a necrotic eschar on the right second toe. Because of these being arterial wounds I reduced her compression from 3-2 layers this seems to of done satisfactorily she has not had any problems. I cannot see that she is actually had an x-ray ====== 11/06/16 the patient comes in for evaluation of her right lower extremity ulcers. She was here in January 2018 for 2 visits subsequently ended up in the hospital with pneumonia and then to rehabilitation. She has now been discharged from rehabilitation and is home. She has multiple ulcerations to her right lower extremity including the foot and toes. She does have home health in place and they have been placing alginate to the ulcers. She is followed by Dr. Bridgett Larsson of vascular medicine. 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 She recently saw him on 3/23. In office ABIs were:  Right 0.48 with monophasic flow to the DP, PT, peroneal Left 0.63 with monophasic flow to the DP and PT Her arterial studies indicated a patent right below knee popliteal to peroneal bypass She had an MRI in February 2008 that was negative frosty myelitis but this showed general soft tissue edema in the right foot and lower extremity concerning  for cellulitis She is a diabetic, managed with insulin. Her hemoglobin A1c in December 2017 was 8.4 which is a trend up from previous levels. She had blood work in February 2018 which revealed an albumin of 2.6 this appears to be relatively acute as an albumin in November 2017 was 3.7 11/13/16; 77 this is a patient I have not seen since February who is readmitted to our clinic last week. 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 Bridgett Larsson. I have reviewed Dr. Lianne Moris notes from March/23/18. Doppler ABI on that date showed an ABI on the right of 0.48 and on the left of 0.63. Dorsalis pedis waveforms were monophasic bilaterally. There was no waveforms detected at the posterior tibial on the right, monophasic on the left. Dr. Geoffry Paradise, [name] [name] ([id number]) [name] [name] were that this patient would have follow-up vascular studies in 3 months including ABIs and right lower extremity arterial duplex. She had an MRI in February that was negative for osteomyelitis but showed generalized edema in the foot. Last albumin I see was in January at 3.4 we have been using Santyl to the 4 wounds on the right leg the patient is noted today to have widespread edema well up towards her groin this is pitting 2-3+. 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 Normal ejection fraction. 11/20/16; patient has a follow-up appointment with Dr. Bridgett Larsson on April 23. She is still complaining of a lot of pain in the right foot and right leg. It is not clear to me that this is at all positional however I think it is clear claudication with minimal activity perhaps at rest. At our suggestion she is return to her primary physician's office tomorrow with regards to her pitting lower extremity bilateral edema that I reviewed in detail last week 11/27/16; the follow-up with Dr. Bridgett Larsson was actually on May 23 on April 23 as I  stated in my note last week. N/A case all of her wounds seems somewhat smaller. The 2 on the right leg are definitely smaller. The areas on the dorsal right first toe, right third toe and the lateral part of the right fifth metatarsal head all looks smaller but have tightly adherent surfaces. We have been using Santyl 12/04/16; follow-up with Dr. Bridgett Larsson on May 23. 2 small open wounds on the right leg continue to get smaller. The area on the right third total lateral aspect of the right fifth toe also look better. The remaining area on the dorsal first toe still has some depth to it. We have been using Santyl to the toes and collagen on the right 12/11/16; according to patient's husband the follow-up with Dr. Bridgett Larsson is not until July. All of the wounds on the right leg are measuring smaller. We have been using some combination of Prisma and Santyl although I think we can go to straight Prisma today. There may have been some confusion with home health about the primary dressing orders here. 12/25/16; the patient has had some healing this week. The area on her right  lateral fifth metatarsal head, right third toe are both healed lower right leg is healed. In the vein harvest site superiorly she has one superficial open area. On the dorsal aspect of her right great toe/MTP joint the wound is now divided into 2 however the proximal area is deep and there is palpable bone 01/01/17; still an open area in the middle of her original right surgical scar. The area on the right third toe and right lateral fifth metatarsal head remained closed. Problematic area on the dorsal aspect of the toe. Previous surgery in this area line 01/08/17 small open area on the original scar on her upper anterior leg although this is closing. X-ray I did of the right first toe did not show underlying bony abnormality. Still this area on the dorsal first toe probes to bone. We have been using Prisma 01/15/17; small open area in the original scar  in the upper anterior leg is almost fully closed. She has 2 open areas over the dorsal aspect of the right first toe that probes to bone. Used and a form starting last week. Vigorous bone scraping that I did last week showed few methicillin sensitive staph aureus. She is allergic to penicillin and sulfa drugs. I'm going to give her 2 weeks of doxycycline. She will need an MRI with contrast. 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 She continues on doxycycline for a bone scraping that showed methicillin sensitive staph aureus [allergic to penicillin and sulfa]. We have been using Endo form to the wound. 01/29/17; surprisingly her MRI did not show osteomyelitis. She continues on doxycycline for a bone scraping that showed methicillin sensitive staph aureus with allergies to penicillin and sulfa. We have been using Endo form to the wound. Unfortunately I cannot get a surface on this visit looks like it is able to support a healing state. Proximally there is still exposed bone. There is no overt soft tissue tenderness. Her MRI did show a previous fracture was surgery to this area but no hardware 02/12/17 on evaluation today patient appears to be doing well in regard to her lower extremity wound. She does have some mild discomfort but this is minimal. There's no evidence of infection. Her left great toe nail has somewhat been lifted up and there is a little bit of slight bleeding underneath but this is still firmly attached. 02/19/17; she ran out of doxycycline 2 days ago. Nevertheless I would like to continue this for 2 weeks to make a full 6 weeks of therapy as the bone scraping that I did from the open area on the dorsal right first toe showed MRSA. This should complete antibiotic therapy. 02/26/17; the patient is completing her 6 weeks of oral doxycycline. Deep wound on the dorsal right toe. This still has some exposed bone  proximally. She does have a reasonably granulated surface albeit thin over bone. I've been using Endo form have made application for an amniotic skin sub 03/05/17; the patient has completed 6 weeks of doxycycline. This is a deep wound on the dorsal right toe which has exposed bone proximally. She has granulation over most of this wound albeit a thin layer. I've been using Endo form. Still do not have approval for Affinity 03/13/17 on evaluation today patient's right foot wound appears to be doing better measurement wise compared to her last evaluation. She has been tolerating the dressing change without complication and  does have a appointment with the dietary that has been made as far as referral is concerned there just waiting for her and transportation to contact them back for an actual date. Nonetheless patient has been having less pain at this point. No fevers, chills, nausea, or vomiting noted at this time. 03/26/17; linear wound over the dorsal aspect of her right great toe. At one point this had exposed bone on the most superior aspect however I am pleased to see today that this appears to have a surface of granulation. Apparently product was applied for but denied by [facility]. We'll need to see what that was. The patient has known PAD status post revascularization. MRI Bye, [name] V. ([id number]) did not show osteomyelitis which was done in June 04/02/17; continued improvement using Endoform. She was approved for [employer] but hasn't over $606 co-pay per application, this is beyond her means 04/09/17; continues on Endoform. 04/16/17; appears to be doing nicely continuing on Endoform 04/22/17; patient did not have her dressing changed all week because of the weather. Using Endoform. Base of the wound looks healthy elbow there appears to events surrounding maceration perhaps because of the drainage was not changing the wound dressing 04/30/17 wound today continues to close and except for a small divot on  the superior part of the wound. This area did not probe the bone but I found this a little concerning as this was the area with exposed bone before we be able to get this to granulate forward. As I remember things she is not a candidate for skin substitutes secondary to an outlandish co-pay. I asked her husband to look into the out of pocket max for medications if he has 1 [Regranex] or totally for skin substitutes 05/07/17; the patient arrives today with a wound roughly the same size although under careful inspection under the light there appears to be some epithelialization. Her intake nurse noted that the Endoform seemed to be placed over the wound rather than in the deeper divots they could really benefit from the Endoform. At this point I have no plans to change the Endoform as at one point this was at least 33% exposed bone and the rest of the wound very close to the underlying phalanx 05/14/17; no major change in the size or appearance of the wound. We have been using Endoform for a prolonged period of time with reasonably good improvement in the epithelialization i.e. no exposed bone especially proximally. Unless we've not made a lot of changes in the last several weeks. She does not complain of pain. She was revascularized early this year or perhaps late last year by Dr. Bridgett Larsson and I wonder if he will need to have a look at her, I will need to review the actual vascular procedure which I think was a distal popliteal bypass 05/21/17; switched to either for a blue last week. Patient arrived in clinic with the wound not measuring any better but the surface looking some better. Unfortunately she had some surface slough and when I went to remove this superiorly she had exposed bone. Previously she had had exposed bone in the inferior part of the wound however this is granulated over some weeks ago. Clearly this is a major step back for her. With some difficulty I was able to obtain a piece of the  bone probing through the superior part of the wound for culture. We did not send pathology. It is likely that she is going to need imaging of this site but  I did not order this today 05/28/17; bone culture I took last week showed MSSA. She still has exposed bone however I could not get another piece for pathology. She had an MRI 4 months ago that did not show osteoarthritic at time. Wound rapidly deteriorated superiorly and I suspect this is underlying osteomyelitis. We have been using Hydrofera Blue 06/04/17 on evaluation today patient's wound appears to actually be doing a little bit better visually compared to last week's evaluation which is good news. Nonetheless she does have osteomyelitis of the toe which does not appear to be MRSA. No fevers, chills, nausea, or vomiting noted at this time. She is having no discomfort. 06/11/17; patient's wound looks a little healthier than I remember seeing it 2 weeks ago. There is no exposed bone and the surface of the wound appears well granulated. We've been using hydrofera blue. I note that she was started on doxycycline which was MSSA. Her appointment with Dr. Ola Spurr is on November 12 I believe. 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 Vallarie Mare of vascular surgery on 11/9. He offered right leg angiography possible intervention however the patient refused. I've discussed this with the patient's husband today she did not want any further procedures. Also noteworthy that Dr. Vallarie Mare stated that this wound had not healed in 3 years, I was not aware of this degree of chronicity in this area. She has underlying osteomyelitis by bone culture. Culture of this grew MSSA, she is allergic to penicillin and therefore not a candidate for beta lactams. She saw Dr. Ola Spurr of infectious disease this morning, he continued her on doxycycline for another month and apparently sent in a prescription. We have been using Hydrofera Blue. ABI's  update by Dr. Bridgett Larsson right 0.62, left 0.89. Monophasic wave forms 06/25/17 on evaluation today patient appears to be doing well in regard to her right great toe wound. She is still taking the doxycycline as prescribed by Dr. Ola Spurr. The Hydrofera Blue Dressing's to be doing as well as anything has for her up to this point and we are still waiting on an insurance authorization for the [place]. She is having no pain which is good news. No fevers, chills, nausea, or vomiting noted at this time. 07/02/17; still taking doxycycline as prescribed by Dr. Ola Spurr. Hydrofera Blue continues. We have no palpable bone. Still have not had had approval of Apligraf 08/06/16; the patient arrives today with [facility] even though we apparently had changed to Sorbact last time. Her co-pay for Regranex is $389 in discussion with the patient and her husband this was excessive. We'll continue with the Sorbact and standard dressings for now 08/20/17; we have been using sorbact. The wound bed still requires debridement. Wound measurements are slightly better. 09/03/17;using Sorbact.no debridement today. Wound measurements slightly better. There is some discoloration of the tip of her toelooks like bruising 09/17/17; using Sorbact. No debridement today. Wound dimensions are about the same. Still some discoloration at the tip of [name] [name] [name] V. ([id number]) her great toe. This does not look any worse than last time 10/01/17; using sorbact right great toe I thought she might have surface epithelialization last time although it is certainly not look like that today nevertheless her dimensions are better. Continued surface eschar on the tip of her toe but this is not progressing. She is actually complaining of the left great toe Electronic Signature(s) Signed: 10/01/2017 5:07:14 PM By: Linton Ham MD Entered By: Linton Ham on 10/01/2017 10:50:20 Leist, [name] [name] V.  (  [id number]) -------------------------------------------------------------------------------- Physical Exam Details Patient Name: [name] [name] [name] V. Date of Service: 10/01/2017 10:00 AM Medical Record Number: [id number] Patient Account Number: [id number] Date of Birth/Sex: 1941/06/19 (77 y.o. Female) Treating RN: Cornell Barman Primary Care Provider: Beverlyn Roux Other Clinician: Referring Provider: Beverlyn Roux Treating Provider/Extender: Tito Dine in Treatment: 2 Constitutional Sitting or standing Blood Pressure is within target range for patient.. Pulse regular and within target range for patient.[name] Respirations regular, non-labored and within target range.. Temperature is normal and within the target range for the patient.[name] appears in no distress. Eyes Conjunctivae clear. No discharge. Respiratory Respiratory effort is easy and symmetric bilaterally. Rate is normal at rest and on room air.. Cardiovascular I can feel her dorsalis pedis pulse even through the edema. Known PAD status post revascularization. Edema present in both extremities. This is nonpitting. I think she does have some degree of lymphedema.. Integumentary (Hair, Skin) There is no rash or other skin issues seen chronic venous insufficiency. Psychiatric No evidence of depression, anxiety, or agitation. Calm, cooperative, and communicative. Appropriate interactions and affect.. Notes Wound exam; the patient's wound is on the dorsal first toe. This appears to be improved in terms of length and width not necessarily depth although the base of the wound looks healthy. No debridement is required Electronic Signature(s) Signed: 10/01/2017 5:07:14 PM By: Linton Ham MD Entered By: Linton Ham on 10/01/2017 10:53:33 [name] [name] [name] [name] ([id number]) -------------------------------------------------------------------------------- Physician Orders Details Patient Name: Selway, Carmencita V. Date of Service: 10/01/2017 10:00  AM Medical Record Number: [id number] Patient Account Number: [id number] Date of Birth/Sex: 06-29-1941 (77 y.o. Female) Treating RN: Cornell Barman Primary Care Provider: Beverlyn Roux Other Clinician: Referring Provider: Beverlyn Roux Treating Provider/Extender: Tito Dine in Treatment: 58 Verbal / Phone Orders: No Diagnosis Coding Wound Cleansing Wound #7 Right,Dorsal Metatarsal head first o Clean wound with Normal Saline. o Cleanse wound with mild soap and water Anesthetic (add to Medication List) Wound #7 Right,Dorsal Metatarsal head first o Topical Lidocaine 4% cream applied to wound bed prior to debridement (In Clinic Only). Primary Wound Dressing Wound #7 Right,Dorsal Metatarsal head first o Other: - Sorbact Secondary Dressing Wound #7 Right,Dorsal Metatarsal head first o ABD pad o Conform/Kerlix Dressing Change Frequency Wound #7 Right,Dorsal Metatarsal head first o Change Dressing Monday, Wednesday, Friday Follow-up Appointments Wound #7 Right,Dorsal Metatarsal head first o Return Appointment in 2 weeks. Edema Control Wound #7 Right,Dorsal Metatarsal head first o Elevate legs to the level of the heart and pump ankles as often as possible Additional Orders / Instructions Wound #7 Right,Dorsal Metatarsal head first o Increase protein intake. o Activity as tolerated Home Health Wound #7 Right,Dorsal Metatarsal head first o Continue Home Health Visits - [facility] twice weekly, [facility]-Wednesday o Home Health Nurse may visit PRN to address patientos wound care needs. o FACE TO FACE ENCOUNTER: MEDICARE and MEDICAID PATIENTS: I certify that this patient is under my care and that I had a face-to-face encounter that meets the physician face-to-face encounter requirements with this [name] [name] [name] V. ([id number]) patient on this date. The encounter with the patient was in whole or in part for the following MEDICAL CONDITION: (primary  reason for [place]) MEDICAL NECESSITY: I certify, that based on my findings, NURSING services are a medically necessary home health service. HOME BOUND STATUS: I certify that my clinical findings support that this patient is homebound (i.e., Due to illness or injury, pt requires aid of supportive devices such as crutches, cane, wheelchairs, walkers, the use  of special transportation or the assistance of another person to leave their place of residence. There is a normal inability to leave the home and doing so requires considerable and taxing effort. Other absences are for medical reasons / religious services and are infrequent or of short duration when for other reasons). o If current dressing causes regression in wound condition, may D/C ordered dressing product/s and apply Normal Saline Moist Dressing daily until next [place] / Other MD appointment. [place] of regression in wound condition at [phone]. o Please direct any NON-WOUND related issues/requests for orders to patient's Primary Care Physician Medications-please add to medication list. Wound #7 Right,Dorsal Metatarsal head first o P.O. Antibiotics - continue antibiotics as prescribed Electronic Signature(s) Signed: 10/01/2017 4:57:19 PM By: Gretta Cool, BSN, RN, CWS, Kim RN, BSN Signed: 10/01/2017 5:07:14 PM By: Linton Ham MD Entered By: Gretta Cool, BSN, RN, CWS, Kim on 10/01/2017 10:27:03 [place], [name] [name] ([id number]) -------------------------------------------------------------------------------- Problem List Details Patient Name: [name] [name] [name] V. Date of Service: 10/01/2017 10:00 AM Medical Record Number: [id number] Patient Account Number: [id number] Date of Birth/Sex: 1940-08-31 (77 y.o. 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 ([id number]) -------------------------------------------------------------------------------- Progress Note Details Patient Name: [name] [name] [name] V. Date of Service: 10/01/2017 10:00 AM Medical Record Number: [id number] Patient Account Number: [id number] Date of Birth/Sex: 1941/04/22 (77 y.o. Female) Treating RN: Cornell Barman Primary Care Provider: Beverlyn Roux Other Clinician: Referring Provider: Beverlyn Roux Treating Provider/Extender: Tito Dine in Treatment: 47 Subjective History of Present Illness (HPI) 08/13/16: This is a 77 year old woman who came predominantly for review of 3 cm in diameter circular wound to the left anterior lateral leg. She was in the ER on 08/01/16 I reviewed their notes. There was apparently pus coming out of the wound at that time and the patient arrived requesting debridement which they don't do in the emergency room. Nevertheless I can't see that they did any x-rays. There were no cultures done. She is a type II diabetic and I  a note after the patient was in the clinic that she had a bypass graft from the popliteal to the tibial on the right on 02/28/16. She also had a right greater saphenous vein harvest on the same date for arterial bypass. She is going to have vascular studies including ABIs T ABIs on the right on 08/28/16. The patient's surgery was on 02/28/16 by Dr. Vallarie Mare she had a right below the knee popliteal artery to peroneal artery bypass with reverse greater saphenous vein and an endarterectomy of the mid segment peroneal artery. Postoperatively she had a strong mild monophasic peroneal signal  with a pink foot. It would appear that the patient is had some nonhealing in the surgical saphenous vein harvest site on the left leg. Surprisingly looking through [facility] I cannot see much information about this at all. Dr. Lucious Groves notes from 05/29/16 show that the patient's wounds "are not healed" the right first metatarsal wound healed but then opened back up. 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 At that point the patient had a wound VAC to her right calf with regards to the wounds on her dorsal right toe would appear that these are felt to be arterial wounds. She has had surgery on the metatarsal phalangeal in 2015 I Dr. Doran Durand secondary to a right metatarsal phalangeal joint fracture. She is apparently had discoloration around this area since then. 08/28/16; patient arrives with her wounds in much the same condition. The linear vein harvest site and the circular wound below it which I think was a blister. She also has to probing holes in her right great toe and a necrotic eschar on the right second toe. Because of these being arterial wounds I reduced her compression from 3-2 layers this seems to of done satisfactorily she has not had any problems. I cannot see that she is actually had an  x-ray ====== 11/06/16 the patient comes in for evaluation of her right lower extremity ulcers. She was here in January 2018 for 2 visits subsequently ended up in the hospital with pneumonia and then to rehabilitation. She has now been discharged from rehabilitation and is home. She has multiple ulcerations to her right lower extremity including the foot and toes. She does have home health in place and they have been placing alginate to the ulcers. She is followed by Dr. Bridgett Larsson of vascular medicine. 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 She recently saw him on 3/23. In office ABIs were: Right 0.48 with monophasic flow to the DP, PT, peroneal Left 0.63 with monophasic flow to the DP and PT Her arterial studies indicated a patent right below knee popliteal to peroneal bypass She had an MRI in February 2008 that was negative frosty myelitis but this showed general soft tissue edema in the right foot and lower extremity concerning for cellulitis She is a diabetic, managed with insulin. Her hemoglobin A1c in December 2017 was 8.4 which is a trend up from previous levels. She had blood work in February 2018 which revealed an albumin of 2.6 this appears to be relatively acute as an albumin in November 2017 was 3.7 11/13/16; 77 this is a patient I have not seen since February who is readmitted to our clinic last week. 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 Bridgett Larsson. I have reviewed Dr. Lianne Moris notes from March/23/18. Doppler ABI on that date showed an ABI on the right of 0.48 and on the left of 0.63. Dorsalis pedis waveforms were monophasic bilaterally. There was no waveforms detected at the posterior tibial on the right, monophasic on the left. Dr. Lianne Moris [name] were that this patient would have follow-up vascular studies in 3 months including ABIs and right  lower extremity arterial duplex. She had an MRI in February that was negative for osteomyelitis but showed generalized edema in Burdick, Taylia V. ([id number]) the foot. Last albumin I see  was in January at 3.4 we have been using Santyl to the 4 wounds on the right leg the patient is noted today to have widespread edema well up towards her groin this is pitting 2-3+. 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 Normal ejection fraction. 11/20/16; patient has a follow-up appointment with Dr. Bridgett Larsson on April 23. She is still complaining of a lot of pain in the right foot and right leg. It is not clear to me that this is at all positional however I think it is clear claudication with minimal activity perhaps at rest. At our suggestion she is return to her primary physician's office tomorrow with regards to her pitting lower extremity bilateral edema that I reviewed in detail last week 11/27/16; the follow-up with Dr. Bridgett Larsson was actually on May 23 on April 23 as I stated in my note last week. N/A case all of her wounds seems somewhat smaller. The 2 on the right leg are definitely smaller. The areas on the dorsal right first toe, right third toe and the lateral part of the right fifth metatarsal head all looks smaller but have tightly adherent surfaces. We have been using Santyl 12/04/16; follow-up with Dr. Bridgett Larsson on May 23. 2 small open wounds on the right leg continue to get smaller. The area on the right third total lateral aspect of the right fifth toe also look better. The remaining area on the dorsal first toe still has some depth to it. We have been using Santyl to the toes and collagen on the right 12/11/16; according to patient's husband the follow-up with Dr. Bridgett Larsson is not until July. All of the wounds on the right leg are measuring smaller. We have been using some combination of Prisma and Santyl although I think we can go to straight Prisma today.  There may have been some confusion with home health about the primary dressing orders here. 12/25/16; the patient has had some healing this week. The area on her right lateral fifth metatarsal head, right third toe are both healed lower right leg is healed. In the vein harvest site superiorly she has one superficial open area. On the dorsal aspect of her right great toe/MTP joint the wound is now divided into 2 however the proximal area is deep and there is palpable bone 01/01/17; still an open area in the middle of her original right surgical scar. The area on the right third toe and right lateral fifth metatarsal head remained closed. Problematic area on the dorsal aspect of the toe. Previous surgery in this area line 01/08/17 small open area on the original scar on her upper anterior leg although this is closing. X-ray I did of the right first toe did not show underlying bony abnormality. Still this area on the dorsal first toe probes to bone. We have been using Prisma 01/15/17; small open area in the original scar in the upper anterior leg is almost fully closed. She has 2 open areas over the dorsal aspect of the right first toe that probes to bone. Used and a form starting last week. Vigorous bone scraping that I did last week showed few methicillin sensitive staph aureus. She is allergic to penicillin and sulfa drugs. I'm going to give her 2 weeks of doxycycline. She will need an MRI with contrast. 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 She continues  on doxycycline for a bone scraping that showed methicillin sensitive staph aureus [allergic to penicillin and sulfa]. We have been using Endo form to the wound. 01/29/17; surprisingly her MRI did not show osteomyelitis. She continues on doxycycline for a bone scraping that showed methicillin sensitive staph aureus with allergies to penicillin and sulfa. We have been using  Endo form to the wound. Unfortunately I cannot get a surface on this visit looks like it is able to support a healing state. Proximally there is still exposed bone. There is no overt soft tissue tenderness. Her MRI did show a previous fracture was surgery to this area but no hardware 02/12/17 on evaluation today patient appears to be doing well in regard to her lower extremity wound. She does have some mild discomfort but this is minimal. There's no evidence of infection. Her left great toe nail has somewhat been lifted up and there is a little bit of slight bleeding underneath but this is still firmly attached. 02/19/17; she ran out of doxycycline 2 days ago. Nevertheless I would like to continue this for 2 weeks to make a full 6 weeks of therapy as the bone scraping that I did from the open area on the dorsal right first toe showed MRSA. This should complete antibiotic therapy. 02/26/17; the patient is completing her 6 weeks of oral doxycycline. Deep wound on the dorsal right toe. This still has some exposed bone proximally. She does have a reasonably granulated surface albeit thin over bone. I've been using Endo form have made application for an amniotic skin sub 03/05/17; the patient has completed 6 weeks of doxycycline. This is a deep wound on the dorsal right toe which has exposed bone proximally. She has granulation over most of this wound albeit a thin layer. I've been using Endo form. Still do not have approval for Affinity 03/13/17 on evaluation today patient's right foot wound appears to be doing better measurement wise compared to her last evaluation. She has been tolerating the dressing change without complication and does have a appointment with the dietary that has been made as far as referral is concerned there just waiting for her and transportation to contact them back for an actual date. Nonetheless patient has been having less pain at this point. No fevers, chills, nausea, or vomiting  noted at this time. 03/26/17; linear wound over the dorsal aspect of her right great toe. At one point this had exposed bone on the most superior aspect however I am pleased to see today that this appears to have a surface of granulation. Apparently product was applied for but denied by [facility]. We'll need to see what that was. The patient has known PAD status post revascularization. MRI did not show osteomyelitis which was done in June 04/02/17; continued improvement using Endoform. She was approved for Oasis but hasn't over $161 co-pay per application, Fusaro, Ahtziry V. ([id number]) this is beyond her means 04/09/17; continues on Endoform. 04/16/17; appears to be doing nicely continuing on Endoform 04/22/17; patient did not have her dressing changed all week because of the weather. Using Endoform. Base of the wound looks healthy elbow there appears to events surrounding maceration perhaps because of the drainage was not changing the wound dressing 04/30/17 wound today continues to close and except for a small divot on the superior part of the wound. This area did not probe the bone but I found this a little concerning as this was the area with exposed bone before we be able to get  this to granulate forward. As I remember things she is not a candidate for skin substitutes secondary to an outlandish co-pay. I asked her husband to look into the out of pocket max for medications if he has 1 [Regranex] or totally for skin substitutes 05/07/17; the patient arrives today with a wound roughly the same size although under careful inspection under the light there appears to be some epithelialization. Her intake nurse noted that the Endoform seemed to be placed over the wound rather than in the deeper divots they could really benefit from the Endoform. At this point I have no plans to change the Endoform as at one point this was at least 33% exposed bone and the rest of the wound very close to the underlying  phalanx 05/14/17; no major change in the size or appearance of the wound. We have been using Endoform for a prolonged period of time with reasonably good improvement in the epithelialization i.e. no exposed bone especially proximally. Unless we've not made a lot of changes in the last several weeks. She does not complain of pain. She was revascularized early this year or perhaps late last year by Dr. Bridgett Larsson and I wonder if he will need to have a look at her, I will need to review the actual vascular procedure which I think was a distal popliteal bypass 05/21/17; switched to either for a blue last week. Patient arrived in clinic with the wound not measuring any better but the surface looking some better. Unfortunately she had some surface slough and when I went to remove this superiorly she had exposed bone. Previously she had had exposed bone in the inferior part of the wound however this is granulated over some weeks ago. Clearly this is a major step back for her. With some difficulty I was able to obtain a piece of the bone probing through the superior part of the wound for culture. We did not send pathology. It is likely that she is going to need imaging of this site but I did not order this today 05/28/17; bone culture I took last week showed MSSA. She still has exposed bone however I could not get another piece for pathology. She had an MRI 4 months ago that did not show osteoarthritic at time. Wound rapidly deteriorated superiorly and I suspect this is underlying osteomyelitis. We have been using Hydrofera Blue 06/04/17 on evaluation today patient's wound appears to actually be doing a little bit better visually compared to last week's evaluation which is good news. Nonetheless she does have osteomyelitis of the toe which does not appear to be MRSA. No fevers, chills, nausea, or vomiting noted at this time. She is having no discomfort. 06/11/17; patient's wound looks a little healthier than I  remember seeing it 2 weeks ago. There is no exposed bone and the surface of the wound appears well granulated. We've been using hydrofera blue. I note that she was started on doxycycline which was MSSA. Her appointment with Dr. Ola Spurr is on November 12 I believe. 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 Vallarie Mare of vascular surgery on 11/9. He offered right leg angiography possible intervention however the patient refused. I've discussed this with the patient's husband today she did not want any further procedures. Also noteworthy that Dr. Vallarie Mare stated that this wound had not healed in 3 years, I was not aware of this degree of chronicity in this area. She has underlying osteomyelitis by bone culture. Culture of this grew MSSA,  she is allergic to penicillin and therefore not a candidate for beta lactams. She saw Dr. Ola Spurr of infectious disease this morning, he continued her on doxycycline for another month and apparently sent in a prescription. We have been using Hydrofera Blue. ABI's update by Dr. Bridgett Larsson right 0.62, left 0.89. Monophasic wave forms 06/25/17 on evaluation today patient appears to be doing well in regard to her right great toe wound. She is still taking the doxycycline as prescribed by Dr. Ola Spurr. The Hydrofera Blue Dressing's to be doing as well as anything has for her up to this point and we are still waiting on an insurance authorization for the [place]. She is having no pain which is good news. No fevers, chills, nausea, or vomiting noted at this time. 07/02/17; still taking doxycycline as prescribed by Dr. Ola Spurr. Hydrofera Blue continues. We have no palpable bone. Still have not had had approval of Apligraf 08/06/16; the patient arrives today with [facility] even though we apparently had changed to Sorbact last time. Her co-pay for Regranex is $389 in discussion with the patient and her husband this was excessive. We'll continue with  the Sorbact and standard dressings for now 08/20/17; we have been using sorbact. The wound bed still requires debridement. Wound measurements are slightly better. 09/03/17;using Sorbact.no debridement today. Wound measurements slightly better. There is some discoloration of the tip of her toelooks like bruising 09/17/17; using Sorbact. No debridement today. Wound dimensions are about the same. Still some discoloration at the tip of her great toe. This does not look any worse than last time 10/01/17; using sorbact right great toe I thought she might have surface epithelialization last time although it is certainly not Berrong, Zeba V. ([id number]) look like that today nevertheless her dimensions are better. Continued surface eschar on the tip of her toe but this is not progressing. She is actually complaining of the left great toe Objective Constitutional Sitting or standing Blood Pressure is within target range for patient.. Pulse regular and within target range for patient.[name] Respirations regular, non-labored and within target range.. Temperature is normal and within the target range for the patient.[name] appears in no distress. Vitals Time Taken: 10:05 AM, Height: 64 in, Weight: 200 lbs, BMI: 34.3, Temperature: 97.7 F, Pulse: 79 bpm, Respiratory Rate: 18 breaths/min, Blood Pressure: 118/96 mmHg. Eyes Conjunctivae clear. No discharge. Respiratory Respiratory effort is easy and symmetric bilaterally. Rate is normal at rest and on room air.. Cardiovascular I can feel her dorsalis pedis pulse even through the edema. Known PAD status post revascularization. Edema present in both extremities. This is nonpitting. I think she does have some degree of lymphedema.[name] Psychiatric No evidence of depression, anxiety, or agitation. Calm, cooperative, and communicative. Appropriate interactions and affect.. General Notes: Wound exam; the patient's wound is on the dorsal first toe. This appears to be improved in  terms of length and width not necessarily depth although the base of the wound looks healthy. No debridement is required Integumentary (Hair, Skin) There is no rash or other skin issues seen chronic venous insufficiency. Wound #7 status is Open. Original cause of wound was Gradually Appeared. The wound is located on the Right,Dorsal Metatarsal head first. The wound measures 0.5cm length x 1.7cm width x 0.3cm depth; 0.668cm^2 area and 0.2cm^3 volume. There is Fat Layer (Subcutaneous Tissue) Exposed exposed. There is no tunneling or undermining noted. There is a medium amount of serous drainage noted. The wound margin is flat and intact. There is large (67-100%) red granulation within  the wound bed. There is a small (1-33%) amount of necrotic tissue within the wound bed including Adherent Slough. The periwound skin appearance exhibited: Induration, Hemosiderin Staining. The periwound skin appearance did not exhibit: Callus, Crepitus, Excoriation, Rash, Scarring, Dry/Scaly, Maceration, Atrophie Blanche, Cyanosis, Ecchymosis, Mottled, Pallor, Rubor, Erythema. Periwound temperature was noted as No Abnormality. The periwound has tenderness on palpation. Assessment Active Problems Jinkins, ASHLEY BULTEMA. 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 Cleanse wound with mild soap and water Anesthetic (add to Medication List): Wound #7 Right,Dorsal Metatarsal head first: Topical Lidocaine 4% cream applied to wound bed prior to debridement (In Clinic Only). Primary Wound Dressing: Wound #7 Right,Dorsal Metatarsal head first: Other: - Sorbact Secondary Dressing: Wound #7 Right,Dorsal Metatarsal head first: ABD pad Conform/Kerlix Dressing Change Frequency: Wound #7 Right,Dorsal Metatarsal head first: Change Dressing Monday, Wednesday, Friday Follow-up Appointments: Wound #7 Right,Dorsal Metatarsal head first: Return Appointment in 2 weeks. Edema Control: Wound #7 Right,Dorsal Metatarsal head first: Elevate legs to the level of the heart and pump ankles as often as possible Additional Orders / Instructions: Wound #7 Right,Dorsal Metatarsal head first: Increase protein intake. Activity as tolerated Home Health: Wound #7 Right,Dorsal Metatarsal head first: [facility] - [facility] twice weekly, [facility] Home Health Nurse may visit PRN to address patient s wound care needs. FACE TO FACE ENCOUNTER: MEDICARE and MEDICAID PATIENTS: I certify that this patient is under my care and that I had a face-to-face encounter that meets the physician face-to-face encounter requirements with this patient on this date. The encounter with the patient was in whole or in part for the following MEDICAL CONDITION: (primary reason for [place]) MEDICAL NECESSITY: I certify, that based on my findings, NURSING services are a medically necessary home health service. HOME BOUND STATUS: I certify that my clinical findings support that this patient is homebound (i.e., Due to illness or injury, pt requires aid of supportive devices such as crutches, cane, wheelchairs, walkers, the use of special transportation or the assistance of another person to leave their place of residence. There is a normal inability to leave  the home and doing so requires considerable and taxing effort. Other absences are for medical reasons / religious services and are infrequent or of short duration when for other reasons). If current dressing causes regression in wound condition, may D/C ordered dressing product/s and apply Normal Saline Moist Dressing daily until next [place] / Other MD appointment. [place] of regression in Ellzey, Bernie V. ([id number]) wound condition at [phone]. Please direct any NON-WOUND related issues/requests for orders to patient's Primary Care Physician Medications-please add to medication list.: Wound #7 Right,Dorsal Metatarsal head first: P.O. Antibiotics - continue antibiotics as prescribed #1 continue sorbact #2 dimensions of the wound actually seems better although there doesn't appear to be a lot of change in the depth. The granulation at the base  of the wound still looks healthy. I felt 2 weeks ago that this may be epithelializing into a divot however I don't think that's the case today. Nevertheless the area looks healthy #3 this looks like something that would respond nicely to something like grafix however in the past they've had an exorbitant co-pay i.e. $503 per application Electronic Signature(s) Signed: 10/01/2017 5:07:14 PM By: Linton Ham MD Entered By: Linton Ham on 10/01/2017 11:02:05 Abraha, Trinadee [name] ([id number]) -------------------------------------------------------------------------------- [place] Details Patient Name: Folk, Amisadai V. Date of Service: 10/01/2017 Medical Record Number: [id number] Patient Account Number: [id number] Date of Birth/Sex: Apr 04, 1941 (77 y.o. 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, right ankle and foot Facility Procedures CPT4 Code: 44967591 Description: 99213 - WOUND CARE VISIT-LEV 3 EST PT Modifier: Quantity: 1 Physician Procedures CPT4 Code Description: 6384665 [facility] [place] - WC PHYS LEVEL 3 - EST PT ICD-10 Diagnosis Description E11.622 Type 2 diabetes mellitus with other skin ulcer 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

## 2017-10-07 NOTE — Progress Notes (Signed)
FREDRIKA, CANBY ([id number]) Visit Report for 10/01/2017 Arrival Information Details Patient Name: Thang, LILAS DIEFENDORF. Date of Service: 10/01/2017 10:00 AM Medical Record Number: [id number] Patient Account Number: [id number] Date of Birth/Sex: 1940-10-27 (77 y.o. Female) Treating RN: Carolyne Fiscal, Debi Primary Care Ramisa Duman: Beverlyn Roux Other Clinician: Referring Maxton Noreen: Beverlyn Roux Treating Halaina Vanduzer/Extender: Tito Dine in Treatment: 37 Visit Information History Since Last Visit All ordered tests and consults were completed: No Patient Arrived: Wheel Chair Added or deleted any medications: No Arrival Time: 10:04 Any new allergies or adverse reactions: No Accompanied By: husband Had a fall or experienced change in No Transfer Assistance: EasyPivot Patient activities of daily living that may affect Lift risk of falls: Patient Identification Verified: Yes Signs or symptoms of abuse/neglect since last visito No Secondary Verification Process Yes Hospitalized since last visit: No Completed: Has Dressing in Place as Prescribed: Yes Patient Requires Transmission-Based No Precautions: Pain Present Now: Yes Patient Has Alerts: No Electronic Signature(s) Signed: 10/01/2017 4:30:35 PM By: Alric Quan Entered By: Alric Quan on 10/01/2017 10:05:28 Lariccia, Chantae V. ([id number]) -------------------------------------------------------------------------------- Clinic Level of Care Assessment Details Patient Name: Muchmore, Annalei V. Date of Service: 10/01/2017 10:00 AM Medical Record Number: [id number] Patient Account Number: [id number] Date of Birth/Sex: [birth year] (77 y.o. Female) Treating RN: Cornell Barman Primary Care Jibri Schriefer: Beverlyn Roux Other Clinician: Referring Esperanza Madrazo: Beverlyn Roux Treating Lexee Brashears/Extender: Tito Dine in Treatment: 19 Clinic Level of Care Assessment Items TOOL 4 Quantity Score []  - Use when only an EandM is performed on FOLLOW-UP visit  0 ASSESSMENTS - Nursing Assessment / Reassessment []  - Reassessment of Co-morbidities (includes updates in patient status) 0 X- 1 5 Reassessment of Adherence to Treatment Plan ASSESSMENTS - Wound and Skin Assessment / Reassessment X - Simple Wound Assessment / Reassessment - one wound 1 5 []  - 0 Complex Wound Assessment / Reassessment - multiple wounds []  - 0 Dermatologic / Skin Assessment (not related to wound area) ASSESSMENTS - Focused Assessment []  - Circumferential Edema Measurements - multi extremities 0 []  - 0 Nutritional Assessment / Counseling / Intervention []  - 0 Lower Extremity Assessment (monofilament, tuning fork, pulses) []  - 0 Peripheral Arterial Disease Assessment (using hand held doppler) ASSESSMENTS - Ostomy and/or Continence Assessment and Care []  - Incontinence Assessment and Management 0 []  - 0 Ostomy Care Assessment and Management (repouching, etc.) PROCESS - Coordination of Care X - Simple Patient / Family Education for ongoing care 1 15 []  - 0 Complex (extensive) Patient / Family Education for ongoing care X- 1 10 Staff obtains Programmer, systems, Records, Test Results / Process Orders []  - 0 Staff telephones HHA, Nursing Homes / Clarify orders / etc []  - 0 Routine Transfer to another Facility (non-emergent condition) []  - 0 Routine Hospital Admission (non-emergent condition) []  - 0 New Admissions / Biomedical engineer / Ordering NPWT, Apligraf, etc. []  - 0 Emergency Hospital Admission (emergent condition) X- 1 10 Simple Discharge Coordination Mergen, Chlora V. ([id number]) []  - 0 Complex (extensive) Discharge Coordination PROCESS - Special Needs []  - Pediatric / Minor Patient Management 0 []  - 0 Isolation Patient Management []  - 0 Hearing / Language / Visual special needs []  - 0 Assessment of Community assistance (transportation, D/C planning, etc.) []  - 0 Additional assistance / Altered mentation []  - 0 Support Surface(s) Assessment (bed,  cushion, seat, etc.) INTERVENTIONS - Wound Cleansing / Measurement X - Simple Wound Cleansing - one wound 1 5 []  - 0 Complex Wound Cleansing - multiple wounds X- 1 5 Wound Imaging (  photographs - any number of wounds) []  - 0 Wound Tracing (instead of photographs) X- 1 5 Simple Wound Measurement - one wound []  - 0 Complex Wound Measurement - multiple wounds INTERVENTIONS - Wound Dressings []  - Small Wound Dressing one or multiple wounds 0 X- 1 15 Medium Wound Dressing one or multiple wounds []  - 0 Large Wound Dressing one or multiple wounds []  - 0 Application of Medications - topical []  - 0 Application of Medications - injection INTERVENTIONS - Miscellaneous []  - External ear exam 0 []  - 0 Specimen Collection (cultures, biopsies, blood, body fluids, etc.) []  - 0 Specimen(s) / Culture(s) sent or taken to Lab for analysis []  - 0 Patient Transfer (multiple staff / Civil Service fast streamer / Similar devices) []  - 0 Simple Staple / Suture removal (25 or less) []  - 0 Complex Staple / Suture removal (26 or more) []  - 0 Hypo / Hyperglycemic Management (close monitor of Blood Glucose) []  - 0 Ankle / Brachial Index (ABI) - do not check if billed separately X- 1 5 Vital Signs Akey, Florinda V. ([id number]) Has the patient been seen at the hospital within the last three years: Yes Total Score: 80 Level Of Care: New/Established - Level 3 Electronic Signature(s) Signed: 10/01/2017 4:57:19 PM By: Gretta Cool, BSN, RN, CWS, Kim RN, BSN Entered By: Gretta Cool, BSN, RN, CWS, Kim on 10/01/2017 10:28:08 Irion, [name] ([id number]) -------------------------------------------------------------------------------- Complex / Palliative Patient Assessment Details Patient Name: Keeny, Bethanny V. Date of Service: 10/01/2017 10:00 AM Medical Record Number: [id number] Patient Account Number: [id number] Date of Birth/Sex: [birth year] (77 y.o. Female) Treating RN: Cornell Barman Primary Care Cynthia Cogle: Beverlyn Roux Other  Clinician: Referring Jernee Murtaugh: Beverlyn Roux Treating Heaven Wandell/Extender: Tito Dine in Treatment: 47 Palliative Management Criteria Complex Wound Management Criteria Patient requires a surgical procedure in order to achieve wound healing: However, the patient does not wish to undergo the recommended surgical procedure. Care Approach Wound Care Plan: Complex Wound Management Electronic Signature(s) Signed: 10/02/2017 11:45:59 AM By: Gretta Cool, BSN, RN, CWS, Kim RN, BSN Signed: 10/07/2017 12:57:36 PM By: Linton Ham MD Entered By: Gretta Cool, BSN, RN, CWS, Kim on 10/02/2017 11:45:59 Locklin, [name] ([id number]) -------------------------------------------------------------------------------- Encounter Discharge Information Details Patient Name: Setters, Becka V. Date of Service: 10/01/2017 10:00 AM Medical Record Number: [id number] Patient Account Number: [id number] Date of Birth/Sex: 16-Jul-1941 (77 y.o. Female) Treating RN: Montey Hora Primary Care Gabryela Kimbrell: Beverlyn Roux Other Clinician: Referring Makenzye Troutman: Beverlyn Roux Treating Leanny Moeckel/Extender: Tito Dine in Treatment: 49 Encounter Discharge Information Items Discharge Pain Level: 0 Discharge Condition: Stable Ambulatory Status: Wheelchair Discharge Destination: Home Transportation: Private Auto Accompanied By: spouse Schedule Follow-up Appointment: Yes Medication Reconciliation completed and No provided to Patient/Care Kadejah Sandiford: Provided on Clinical Summary of Care: 10/01/2017 Form Type Recipient Paper Patient [facility] Electronic Signature(s) Signed: 10/03/2017 10:09:42 AM By: Ruthine Dose Entered By: Ruthine Dose on 10/01/2017 10:42:22 Runde, Tiffanie V. ([id number]) -------------------------------------------------------------------------------- Lower Extremity Assessment Details Patient Name: Carriger, Tanetta V. Date of Service: 10/01/2017 10:00 AM Medical Record Number: [id number] Patient Account Number:  [id number] Date of Birth/Sex: 1941-01-07 (77 y.o. Female) Treating RN: Carolyne Fiscal, Debi Primary Care Edman Lipsey: Beverlyn Roux Other Clinician: Referring Elise Gladden: Beverlyn Roux Treating Keidan Aumiller/Extender: Tito Dine in Treatment: 35 Vascular Assessment Pulses: Dorsalis Pedis Palpable: [Right:Yes] Posterior Tibial Extremity colors, hair growth, and conditions: Extremity Color: [Right:Hyperpigmented] Temperature of Extremity: [Right:Cool] Capillary Refill: [Right:< 3 seconds] Toe Nail Assessment Left: Right: Thick: Yes Discolored: Yes Deformed: Yes Improper Length and Hygiene: Yes Electronic Signature(s) Signed: 10/01/2017 4:30:35 PM By:  Alric Quan Entered By: Alric Quan on 10/01/2017 10:14:51 Helderman, Liyah [name] ([id number]) -------------------------------------------------------------------------------- Multi Wound Chart Details Patient Name: Hatchel, Cinderella V. Date of Service: 10/01/2017 10:00 AM Medical Record Number: [id number] Patient Account Number: [id number] Date of Birth/Sex: 11-Mar-1941 (77 y.o. Female) Treating RN: Cornell Barman Primary Care Ida Milbrath: Beverlyn Roux Other Clinician: Referring Crystall Donaldson: Beverlyn Roux Treating Sussie Minor/Extender: Tito Dine in Treatment: 66 Vital Signs Height(in): 64 Pulse(bpm): 79 Weight(lbs): 200 Blood Pressure(mmHg): 118/96 Body Mass Index(BMI): 34 Temperature(F): 97.7 Respiratory Rate 18 (breaths/min): Photos: [7:No Photos] [N/A:N/A] Wound Location: [7:Right Metatarsal head first - N/A Dorsal] Wounding Event: [7:Gradually Appeared] [N/A:N/A] Primary Etiology: [7:Diabetic Wound/Ulcer of the N/A Lower Extremity] Comorbid History: [7:Cataracts, Chronic sinus problems/congestion, Congestive Heart Failure, Hypertension, Type II Diabetes] [N/A:N/A] Date Acquired: [7:08/06/2013] [N/A:N/A] Weeks of Treatment: [7:47] [N/A:N/A] Wound Status: [7:Open] [N/A:N/A] Measurements L x W x D [7:0.5x1.7x0.3]  [N/A:N/A] (cm) Area (cm) : [7:0.668] [N/A:N/A] Volume (cm) : [7:0.2] [N/A:N/A] % Reduction in Area: [7:43.30%] [N/A:N/A] % Reduction in Volume: [7:43.30%] [N/A:N/A] Classification: [7:Grade 2] [N/A:N/A] Exudate Amount: [7:Medium] [N/A:N/A] Exudate Type: [7:Serous] [N/A:N/A] Exudate Color: [7:amber] [N/A:N/A] Wound Margin: [7:Flat and Intact] [N/A:N/A] Granulation Amount: [7:Large (67-100%)] [N/A:N/A] Granulation Quality: [7:Red] [N/A:N/A] Necrotic Amount: [7:Small (1-33%)] [N/A:N/A] Exposed Structures: [7:Fat Layer (Subcutaneous Tissue) Exposed: Yes Fascia: No Tendon: No Muscle: No Joint: No Bone: No] [N/A:N/A] Epithelialization: [7:Small (1-33%)] [N/A:N/A] Periwound Skin Texture: [7:Induration: Yes Excoriation: No] [N/A:N/A] Callus: No Crepitus: No Rash: No Scarring: No Periwound Skin Moisture: Maceration: No N/A N/A Dry/Scaly: No Periwound Skin Color: Hemosiderin Staining: Yes N/A N/A Atrophie Blanche: No Cyanosis: No Ecchymosis: No Erythema: No Mottled: No Pallor: No Rubor: No Temperature: No Abnormality N/A N/A Tenderness on Palpation: Yes N/A N/A Wound Preparation: Ulcer Cleansing: N/A N/A Rinsed/Irrigated with Saline Topical Anesthetic Applied: Other: lidocaine 4% Treatment Notes Wound #7 (Right, Dorsal Metatarsal head first) 1. Cleansed with: Clean wound with Normal Saline 2. Anesthetic Topical Lidocaine 4% cream to wound bed prior to debridement 4. Dressing Applied: Hydrogel Other dressing (specify in notes) 5. Secondary Dressing Applied ABD Pad Dry Gauze Kerlix/Conform Notes sorbact, heel foam and gauze wrap Electronic Signature(s) Signed: 10/01/2017 5:07:14 PM By: Linton Ham MD Entered By: Linton Ham on 10/01/2017 10:47:18 Grape, Mekenzie [name] ([id number]) -------------------------------------------------------------------------------- Multi-Disciplinary Care Plan Details Patient Name: Burkle, Throop Date of Service: 10/01/2017 10:00  AM Medical Record Number: [id number] Patient Account Number: [id number] Date of Birth/Sex: 1941/01/27 (77 y.o. Female) Treating RN: Cornell Barman Primary Care Mellony Danziger: Beverlyn Roux Other Clinician: Referring Rody Keadle: Beverlyn Roux Treating Yara Tomkinson/Extender: Tito Dine in Treatment: 40 Active Inactive ` Abuse / Safety / Falls / Self Care Management Nursing Diagnoses: Impaired physical mobility Potential for falls Goals: Patient will remain injury free Date Initiated: 11/06/2016 Target Resolution Date: 12/30/2016 Goal Status: Active Patient/caregiver will verbalize understanding of skin care regimen Date Initiated: 11/06/2016 Target Resolution Date: 12/30/2016 Goal Status: Active Interventions: Assess fall risk on admission and as needed Treatment Activities: Patient referred to home care : 11/06/2016 Notes: ` Nutrition Nursing Diagnoses: Potential for alteratiion in Nutrition/Potential for imbalanced nutrition Goals: Patient/caregiver verbalizes understanding of need to maintain therapeutic glucose control per primary care physician Date Initiated: 11/06/2016 Target Resolution Date: 12/30/2016 Goal Status: Active Interventions: Provide education on elevated blood sugars and impact on wound healing Notes: ` Orientation to the Wound Care Program Nursing Diagnoses: Knowledge deficit related to the wound healing center program ALAZNE, QUANT ([id number]) Goals: Patient/caregiver will verbalize understanding of the [place] Date Initiated: 11/06/2016 Target Resolution  Date: 12/30/2016 Goal Status: Active Interventions: Provide education on orientation to the wound center Notes: ` Venous Leg Ulcer Nursing Diagnoses: Actual venous Insuffiency (use after diagnosis is confirmed) Knowledge deficit related to disease process and management Goals: Non-invasive venous studies are completed as ordered Date Initiated: 11/06/2016 Target Resolution Date:  12/30/2016 Goal Status: Active Patient/caregiver will verbalize understanding of disease process and disease management Date Initiated: 11/06/2016 Target Resolution Date: 12/30/2016 Goal Status: Active Interventions: Assess peripheral edema status every visit. Notes: ` Wound/Skin Impairment Nursing Diagnoses: Impaired tissue integrity Knowledge deficit related to smoking impact on wound healing Knowledge deficit related to ulceration/compromised skin integrity Goals: Ulcer/skin breakdown will heal within 14 weeks Date Initiated: 11/06/2016 Target Resolution Date: 02/05/2017 Goal Status: Active Interventions: Assess ulceration(s) every visit Treatment Activities: Skin care regimen initiated : 11/06/2016 Notes: Electronic Signature(s) Signed: 10/01/2017 4:57:19 PM By: Gretta Cool, BSN, RN, CWS, Kim RN, BSN Guin, Altovise V. ([id number]) Entered By: Gretta Cool, BSN, RN, CWS, Kim on 10/01/2017 10:25:55 Tempesta, [name] ([id number]) -------------------------------------------------------------------------------- Pain Assessment Details Patient Name: Spielmann, Blanco Date of Service: 10/01/2017 10:00 AM Medical Record Number: [id number] Patient Account Number: [id number] Date of Birth/Sex: [birth year] (77 y.o. Female) Treating RN: Carolyne Fiscal, Debi Primary Care Jalene Demo: Beverlyn Roux Other Clinician: Referring Nickie Deren: Beverlyn Roux Treating Shontelle Muska/Extender: Tito Dine in Treatment: 51 Active Problems Location of Pain Severity and Description of Pain Patient Has Paino Yes Site Locations Rate the pain. Current Pain Level: 5 Character of Pain Describe the Pain: Aching Pain Management and Medication Current Pain Management: Notes Topical or injectable lidocaine is offered to patient for acute pain when surgical debridement is performed. If needed, Patient is instructed to use over the counter pain medication for the following 24-48 hours after debridement. Wound care MDs do not prescribed  pain medications. Patient has chronic pain or uncontrolled pain. Patient has been instructed to make an appointment with their Primary Care Physician for pain management. Electronic Signature(s) Signed: 10/01/2017 4:30:35 PM By: Alric Quan Entered By: Alric Quan on 10/01/2017 10:05:43 Hartinger, Lillyth [name] ([id number]) -------------------------------------------------------------------------------- Patient/Caregiver Education Details Patient Name: Massey, Brezlyn V. Date of Service: 10/01/2017 10:00 AM Medical Record Number: [id number] Patient Account Number: [id number] Date of Birth/Gender: 1941-05-30 (77 y.o. Female) Treating RN: Montey Hora Primary Care Physician: Beverlyn Roux Other Clinician: Referring Physician: Beverlyn Roux Treating Physician/Extender: Tito Dine in Treatment: 56 Education Assessment Education Provided To: Patient and Caregiver Education Topics Provided Wound/Skin Impairment: Handouts: Other: wound care as ordered Methods: Demonstration, Explain/Verbal Responses: State content correctly Electronic Signature(s) Signed: 10/01/2017 4:38:39 PM By: Montey Hora Entered By: Montey Hora on 10/01/2017 10:40:23 Ledesma, Sophira V. ([id number]) -------------------------------------------------------------------------------- Wound Assessment Details Patient Name: Aron, Breckyn V. Date of Service: 10/01/2017 10:00 AM Medical Record Number: [id number] Patient Account Number: [id number] Date of Birth/Sex: 1940/10/05 (77 y.o. Female) Treating RN: Carolyne Fiscal, Debi Primary Care Leona Alen: Beverlyn Roux Other Clinician: Referring Sanjit Mcmichael: Beverlyn Roux Treating Climmie Buelow/Extender: Tito Dine in Treatment: 47 Wound Status Wound Number: 7 Primary Diabetic Wound/Ulcer of the Lower Extremity Etiology: Wound Location: Right Metatarsal head first - Dorsal Wound Open Wounding Event: Gradually Appeared Status: Date Acquired: 08/06/2013 Comorbid Cataracts,  Chronic sinus problems/congestion, Weeks Of Treatment: 47 History: Congestive Heart Failure, Hypertension, Type II Clustered Wound: No Diabetes Wound Measurements Length: (cm) 0.5 Width: (cm) 1.7 Depth: (cm) 0.3 Area: (cm) 0.668 Volume: (cm) 0.2 % Reduction in Area: 43.3% % Reduction in Volume: 43.3% Epithelialization: Small (1-33%) Tunneling: No Undermining: No Wound Description Classification: Grade 2 Wound Margin: Flat and  Intact Exudate Amount: Medium Exudate Type: Serous Exudate Color: amber Foul Odor After Cleansing: No Slough/Fibrino Yes Wound Bed Granulation Amount: Large (67-100%) Exposed Structure Granulation Quality: Red Fascia Exposed: No Necrotic Amount: Small (1-33%) Fat Layer (Subcutaneous Tissue) Exposed: Yes Necrotic Quality: Adherent Slough Tendon Exposed: No Muscle Exposed: No Joint Exposed: No Bone Exposed: No Periwound Skin Texture Texture Color No Abnormalities Noted: No No Abnormalities Noted: No Callus: No Atrophie Blanche: No Crepitus: No Cyanosis: No Excoriation: No Ecchymosis: No Induration: Yes Erythema: No Rash: No Hemosiderin Staining: Yes Scarring: No Mottled: No Pallor: No Moisture Rubor: No No Abnormalities Noted: No Dry / Scaly: No Temperature / Pain Maceration: No Temperature: No Abnormality Chastain, Lynore V. ([id number]) Tenderness on Palpation: Yes Wound Preparation Ulcer Cleansing: Rinsed/Irrigated with Saline Topical Anesthetic Applied: Other: lidocaine 4%, Treatment Notes Wound #7 (Right, Dorsal Metatarsal head first) 1. Cleansed with: Clean wound with Normal Saline 2. Anesthetic Topical Lidocaine 4% cream to wound bed prior to debridement 4. Dressing Applied: Hydrogel Other dressing (specify in notes) 5. Secondary Dressing Applied ABD Pad Dry Gauze Kerlix/Conform Notes sorbact, heel foam and gauze wrap Electronic Signature(s) Signed: 10/01/2017 4:30:35 PM By: Alric Quan Entered By:  Alric Quan on 10/01/2017 10:17:06 Murillo, Dezirea V. ([id number]) -------------------------------------------------------------------------------- Vitals Details Patient Name: Urton, Chudney V. Date of Service: 10/01/2017 10:00 AM Medical Record Number: [id number] Patient Account Number: [id number] Date of Birth/Sex: 05/11/1941 (77 y.o. Female) Treating RN: Carolyne Fiscal, Debi Primary Care Kloe Oates: Beverlyn Roux Other Clinician: Referring Sana Tessmer: Beverlyn Roux Treating Dreyah Montrose/Extender: Tito Dine in Treatment: 47 Vital Signs Time Taken: 10:05 Temperature (F): 97.7 Height (in): 64 Pulse (bpm): 79 Weight (lbs): 200 Respiratory Rate (breaths/min): 18 Body Mass Index (BMI): 34.3 Blood Pressure (mmHg): 118/96 Reference Range: 80 - 120 mg / dl Electronic Signature(s) Signed: 10/01/2017 4:30:35 PM By: Alric Quan Entered By: Alric Quan on 10/01/2017 10:08:21

## 2017-10-15 ENCOUNTER — Encounter: Payer: Medicare HMO | Attending: Internal Medicine | Admitting: Internal Medicine

## 2017-10-15 DIAGNOSIS — I70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 of foot: Secondary | ICD-10-CM | POA: Insufficient documentation

## 2017-10-15 DIAGNOSIS — L97219 Non-pressure chronic ulcer of right calf with unspecified severity: Secondary | ICD-10-CM | POA: Diagnosis not present

## 2017-10-15 DIAGNOSIS — E11621 Type 2 diabetes mellitus with foot ulcer: Secondary | ICD-10-CM | POA: Insufficient documentation

## 2017-10-15 DIAGNOSIS — L97519 Non-pressure chronic ulcer of other part of right foot with unspecified severity: Secondary | ICD-10-CM | POA: Diagnosis not present

## 2017-10-15 DIAGNOSIS — I5032 Chronic diastolic (congestive) heart failure: Secondary | ICD-10-CM | POA: Diagnosis not present

## 2017-10-15 DIAGNOSIS — I70232 Atherosclerosis of native arteries of right leg with ulceration of calf: Secondary | ICD-10-CM | POA: Insufficient documentation

## 2017-10-15 DIAGNOSIS — E113599 Type 2 diabetes mellitus with proliferative diabetic retinopathy without macular edema, unspecified eye: Secondary | ICD-10-CM | POA: Diagnosis not present

## 2017-10-15 DIAGNOSIS — E11622 Type 2 diabetes mellitus with other skin ulcer: Secondary | ICD-10-CM | POA: Insufficient documentation

## 2017-10-15 DIAGNOSIS — M86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, right ankle and foot: Secondary | ICD-10-CM | POA: Diagnosis not present

## 2017-10-15 DIAGNOSIS — I110 Hypertensive heart disease with heart failure: Secondary | ICD-10-CM | POA: Insufficient documentation

## 2017-10-17 NOTE — Progress Notes (Signed)
[name] ([id number]) Visit Report for 10/15/2017 Debridement Details Patient Name: [name], [name] Date of Service: 10/15/2017 10:00 AM Medical Record Number: [id number] Patient Account Number: [id number] Date of Birth/Sex: 07/26/1941 (77 y.o. Female) Treating RN: Cornell Barman Primary Care Provider: Beverlyn Roux Other Clinician: Referring Provider: Beverlyn Roux Treating Provider/Extender: Tito Dine in Treatment: 3 Debridement Performed for Wound #11 Toe Great Assessment: Performed By: Physician Ricard Dillon, MD Debridement: Debridement Severity of Tissue Pre Fat layer exposed Debridement: Pre-procedure Verification/Time Yes - 10:30 Out Taken: Start Time: 10:30 Pain Control: Other : liodicane 4% Level: Skin/Subcutaneous Tissue Total Area Debrided (L x W): 0.3 (cm) x 0.5 (cm) = 0.15 (cm) Tissue and other material Viable, Non-Viable, Subcutaneous debrided: Instrument: Curette Bleeding: Moderate Hemostasis Achieved: Pressure End Time: 10:33 Procedural Pain: 0 Post Procedural Pain: 0 Response to Treatment: Procedure was tolerated well Post Debridement Measurements of Total Wound Length: (cm) 0.3 Width: (cm) 0.5 Depth: (cm) 0.2 Volume: (cm) 0.024 Character of Wound/Ulcer Post Debridement: Stable Severity of Tissue Post Debridement: Fat layer exposed Post Procedure Diagnosis Same as Pre-procedure Electronic Signature(s) Signed: 10/15/2017 5:38:59 PM By: Gretta Cool, BSN, RN, CWS, Kim RN, BSN Signed: 10/15/2017 5:42:24 PM By: Linton Ham MD Entered By: Linton Ham on 10/15/2017 11:03:30 [name], [name] ([id number]) -------------------------------------------------------------------------------- Debridement Details Patient Name: [name], [name] Date of Service: 10/15/2017 10:00 AM Medical Record Number: [id number] Patient Account Number: [id number] Date of Birth/Sex: [birth year] (77 y.o. Female) Treating RN: Cornell Barman Primary Care Provider: Beverlyn Roux Other Clinician: Referring Provider: Beverlyn Roux Treating Provider/Extender: Tito Dine in Treatment: 49 Debridement Performed for Wound #7 Right,Dorsal Metatarsal head first Assessment: Performed By: Physician Ricard Dillon, MD Debridement: Debridement Severity of Tissue Pre Fat layer exposed Debridement: Pre-procedure Verification/Time Yes - 10:30 Out Taken: Start Time: 10:30 Pain Control: Other : liodicane 4% Level: Skin/Subcutaneous Tissue Total Area Debrided (L x W): 0.5 (cm) x 1.5 (cm) = 0.75 (cm) Tissue and other material Viable, Non-Viable, Subcutaneous debrided: Instrument: Curette Bleeding: Moderate Hemostasis Achieved: Pressure End Time: 10:33 Procedural Pain: 0 Post Procedural Pain: 0 Response to Treatment: Procedure was tolerated well Post Debridement Measurements of Total Wound Length: (cm) 0.5 Width: (cm) 1.5 Depth: (cm) 0.2 Volume: (cm) 0.118 Character of Wound/Ulcer Post Debridement: Stable Severity of Tissue Post Debridement: Fat layer exposed Post Procedure Diagnosis Same as Pre-procedure Electronic Signature(s) Signed: 10/15/2017 5:38:59 PM By: Gretta Cool, BSN, RN, CWS, Kim RN, BSN Signed: 10/15/2017 5:42:24 PM By: Linton Ham MD Entered By: Linton Ham on 10/15/2017 11:03:46 [name], [name] ([id number]) -------------------------------------------------------------------------------- HPI Details Patient Name: [name], [name] Date of Service: 10/15/2017 10:00 AM Medical Record Number: [id number] Patient Account Number: [id number] Date of Birth/Sex: [birth year] (77 y.o. Female) Treating RN: Cornell Barman Primary Care Provider: Beverlyn Roux Other Clinician: Referring Provider: Beverlyn Roux Treating Provider/Extender: Tito Dine in Treatment: 75 History of Present Illness HPI Description: 08/13/16: This is a 77 year old woman who came predominantly for review of 3 cm in diameter circular wound to the left anterior  lateral leg. She was in the ER on 08/01/16 I reviewed their notes. There was apparently pus coming out of the wound at that time and the patient arrived requesting debridement which they don't do in the emergency room. Nevertheless I can't see that they did any x-rays. There were no cultures done. She is a type II diabetic and I a note after the patient was in the clinic that she had a bypass graft from the popliteal to  the tibial on the right on 02/28/16. She also had a right greater saphenous vein harvest on the same date for arterial bypass. She is going to have vascular studies including ABIs T ABIs on the right on 08/28/16. The patient's surgery was on 02/28/16 by Dr. Vallarie Mare she had a right below the knee popliteal artery to peroneal artery bypass with reverse greater saphenous vein and an endarterectomy of the mid segment peroneal artery. Postoperatively she had a strong mild monophasic peroneal signal with a pink foot. It would appear that the patient is had some nonhealing in the surgical saphenous vein harvest site on the left leg. Surprisingly looking through [facility] I cannot see much information about this at all. Dr. Lucious Groves notes from 05/29/16 show that the patient's wounds "are not healed" the right first metatarsal wound healed but then opened back up. 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 At that point the patient had a wound VAC to her right calf with regards to the wounds on her dorsal right toe would appear that these are felt to be arterial wounds. She has had surgery on the metatarsal phalangeal in 2015 I Dr. Doran Durand secondary to a right metatarsal phalangeal joint fracture. She is apparently had discoloration around this area since then. 08/28/16; patient arrives with her wounds in much the same condition. The linear vein harvest site and the circular wound below it which  I think was a blister. She also has to probing holes in her right great toe and a necrotic eschar on the right second toe. Because of these being arterial wounds I reduced her compression from 3-2 layers this seems to of done satisfactorily she has not had any problems. I cannot see that she is actually had an x-ray ====== 11/06/16 the patient comes in for evaluation of her right lower extremity ulcers. She was here in January 2018 for 2 visits subsequently ended up in the hospital with pneumonia and then to rehabilitation. She has now been discharged from rehabilitation and is home. She has multiple ulcerations to her right lower extremity including the foot and toes. She does have home health in place and they have been placing alginate to the ulcers. She is followed by Dr. Bridgett Larsson of vascular medicine. 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 She recently saw him on 3/23. In office ABIs were: Right 0.48 with monophasic flow to the DP, PT, peroneal Left 0.63 with monophasic flow to the DP and PT Her arterial studies indicated a patent right below knee popliteal to peroneal bypass She had an MRI in February 2008 that was negative frosty myelitis but this showed general soft tissue edema in the right foot and lower extremity concerning for cellulitis She is a diabetic, managed with insulin. Her hemoglobin A1c in December 2017 was 8.4 which is a trend up from previous levels. She had blood work in February 2018 which revealed an albumin of 2.6 this appears to be relatively acute as an albumin in November 2017 was 3.7 11/13/16; this is a patient I have not seen since February who is readmitted to our clinic last week. 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 Bridgett Larsson. I have reviewed Dr. Lianne Moris notes from March/23/18. Doppler ABI on that date showed an  ABI on the  right of 0.48 and on the left of 0.63. Dorsalis pedis waveforms were monophasic bilaterally. There was no waveforms detected at the posterior tibial on the right, monophasic on the left. Dr. Lianne Moris comments were that this patient would have follow-up vascular studies in 3 months including ABIs and right lower extremity arterial duplex. She had an MRI in February that was negative for osteomyelitis but showed generalized edema in the foot. Last albumin I see was in January at 3.4 we have been using Santyl to the 4 wounds on the right leg Mccord, Meah V. ([id number]) the patient is noted today to have widespread edema well up towards her groin this is pitting 2-3+. 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 Normal ejection fraction. 11/20/16; patient has a follow-up appointment with Dr. Bridgett Larsson on April 23. She is still complaining of a lot of pain in the right foot and right leg. It is not clear to me that this is at all positional however I think it is clear claudication with minimal activity perhaps at rest. At our suggestion she is return to her primary physician's office tomorrow with regards to her pitting lower extremity bilateral edema that I reviewed in detail last week 11/27/16; the follow-up with Dr. Bridgett Larsson was actually on May 23 on April 23 as I stated in my note last week. N/A case all of her wounds seems somewhat smaller. The 2 on the right leg are definitely smaller. The areas on the dorsal right first toe, right third toe and the lateral part of the right fifth metatarsal head all looks smaller but have tightly adherent surfaces. We have been using Santyl 12/04/16; follow-up with Dr. Bridgett Larsson on May 23. 2 small open wounds on the right leg continue to get smaller. The area on the right third total lateral aspect of the right fifth toe also look better. The remaining area on the dorsal first toe still has some depth to it. We have been using  Santyl to the toes and collagen on the right 12/11/16; according to patient's husband the follow-up with Dr. Bridgett Larsson is not until July. All of the wounds on the right leg are measuring smaller. We have been using some combination of Prisma and Santyl although I think we can go to straight Prisma today. There may have been some confusion with home health about the primary dressing orders here. 12/25/16; the patient has had some healing this week. The area on her right lateral fifth metatarsal head, right third toe are both healed lower right leg is healed. In the vein harvest site superiorly she has one superficial open area. On the dorsal aspect of her right great toe/MTP joint the wound is now divided into 2 however the proximal area is deep and there is palpable bone 01/01/17; still an open area in the middle of her original right surgical scar. The area on the right third toe and right lateral fifth metatarsal head remained closed. Problematic area on the dorsal aspect of the toe. Previous surgery in this area line 01/08/17 small open area on the original scar on her upper anterior leg although this is closing. X-ray I did of the right first toe did not show underlying bony abnormality. Still this area on the dorsal first toe probes to bone. We have been using Prisma 01/15/17; small open area in the original scar in the upper anterior leg is almost fully closed. She has 2 open areas  over the dorsal aspect of the right first toe that probes to bone. Used and a form starting last week. Vigorous bone scraping that I did last week showed few methicillin sensitive staph aureus. She is allergic to penicillin and sulfa drugs. I'm going to give her 2 weeks of doxycycline. She will need an MRI with contrast. 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 She continues on doxycycline for a bone scraping that showed methicillin sensitive staph  aureus [allergic to penicillin and sulfa]. We have been using Endo form to the wound. 01/29/17; surprisingly her MRI did not show osteomyelitis. She continues on doxycycline for a bone scraping that showed methicillin sensitive staph aureus with allergies to penicillin and sulfa. We have been using Endo form to the wound. Unfortunately I cannot get a surface on this visit looks like it is able to support a healing state. Proximally there is still exposed bone. There is no overt soft tissue tenderness. Her MRI did show a previous fracture was surgery to this area but no hardware 02/12/17 on evaluation today patient appears to be doing well in regard to her lower extremity wound. She does have some mild discomfort but this is minimal. There's no evidence of infection. Her left great toe nail has somewhat been lifted up and there is a little bit of slight bleeding underneath but this is still firmly attached. 02/19/17; she ran out of doxycycline 2 days ago. Nevertheless I would like to continue this for 2 weeks to make a full 6 weeks of therapy as the bone scraping that I did from the open area on the dorsal right first toe showed MRSA. This should complete antibiotic therapy. 02/26/17; the patient is completing her 6 weeks of oral doxycycline. Deep wound on the dorsal right toe. This still has some exposed bone proximally. She does have a reasonably granulated surface albeit thin over bone. I've been using Endo form have made application for an amniotic skin sub 03/05/17; the patient has completed 6 weeks of doxycycline. This is a deep wound on the dorsal right toe which has exposed bone proximally. She has granulation over most of this wound albeit a thin layer. I've been using Endo form. Still do not have approval for Aff[employer] 03/13/17 on evaluation today patient's right foot wound appears to be doing better measurement wise compared to her last evaluation. She has been tolerating the dressing change  without complication and does have a appointment with the dietary that has been made as far as referral is concerned there just waiting for her and transportation to contact them back for an actual date. Nonetheless patient has been having less pain at this point. No fevers, chills, nausea, or vomiting noted at this time. 03/26/17; linear wound over the dorsal aspect of her right great toe. At one point this had exposed bone on the most superior aspect however I am pleased to see today that this appears to have a surface of granulation. Apparently product was applied for but denied by [facility]. We'll need to see what that was. The patient has known PAD status post revascularization. MRI did not show osteomyelitis which was done in June 04/02/17; continued improvement using Endoform. She was approved for [employer] but hasn't over $409 co-pay per application, this is beyond her means [name], [name] ([id number]) 04/09/17; continues on Endoform. 04/16/17; appears to be doing nicely continuing on Endoform 04/22/17; patient did  not have her dressing changed all week because of the weather. Using Endoform. Base of the wound looks healthy elbow there appears to events surrounding maceration perhaps because of the drainage was not changing the wound dressing 04/30/17 wound today continues to close and except for a small divot on the superior part of the wound. This area did not probe the bone but I found this a little concerning as this was the area with exposed bone before we be able to get this to granulate forward. As I remember things she is not a candidate for skin substitutes secondary to an outlandish co-pay. I asked her husband to look into the out of pocket max for medications if he has 1 [Regranex] or totally for skin substitutes 05/07/17; the patient arrives today with a wound roughly the same size although under careful inspection under the light there appears to be some epithelialization. Her intake nurse  noted that the Endoform seemed to be placed over the wound rather than in the deeper divots they could really benefit from the Endoform. At this point I have no plans to change the Endoform as at one point this was at least 33% exposed bone and the rest of the wound very close to the underlying phalanx 05/14/17; no major change in the size or appearance of the wound. We have been using Endoform for a prolonged period of time with reasonably good improvement in the epithelialization i.e. no exposed bone especially proximally. Unless we've not made a lot of changes in the last several weeks. She does not complain of pain. She was revascularized early this year or perhaps late last year by Dr. Bridgett Larsson and I wonder if he will need to have a look at her, I will need to review the actual vascular procedure which I think was a distal popliteal bypass 05/21/17; switched to either for a blue last week. Patient arrived in clinic with the wound not measuring any better but the surface looking some better. Unfortunately she had some surface slough and when I went to remove this superiorly she had exposed bone. Previously she had had exposed bone in the inferior part of the wound however this is granulated over some weeks ago. Clearly this is a major step back for her. With some difficulty I was able to obtain a piece of the bone probing through the superior part of the wound for culture. We did not send pathology. It is likely that she is going to need imaging of this site but I did not order this today 05/28/17; bone culture I took last week showed MSSA. She still has exposed bone however I could not get another piece for pathology. She had an MRI 4 months ago that did not show osteoarthritic at time. Wound rapidly deteriorated superiorly and I suspect this is underlying osteomyelitis. We have been using Hydrofera Blue 06/04/17 on evaluation today patient's wound appears to actually be doing a little bit better  visually compared to last week's evaluation which is good news. Nonetheless she does have osteomyelitis of the toe which does not appear to be MRSA. No fevers, chills, nausea, or vomiting noted at this time. She is having no discomfort. 06/11/17; patient's wound looks a little healthier than I remember seeing it 2 weeks ago. There is no exposed bone and the surface of the wound appears well granulated. We've been using hydrofera blue. I note that she was started on doxycycline which was MSSA. Her appointment with Dr. Ola Spurr is on November  12 I believe. 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 Vallarie Mare of vascular surgery on 11/9. He offered right leg angiography possible intervention however the patient refused. I've discussed this with the patient's husband today she did not want any further procedures. Also noteworthy that Dr. Vallarie Mare stated that this wound had not healed in 3 years, I was not aware of this degree of chronicity in this area. She has underlying osteomyelitis by bone culture. Culture of this grew MSSA, she is allergic to penicillin and therefore not a candidate for beta lactams. She saw Dr. Ola Spurr of infectious disease this morning, he continued her on doxycycline for another month and apparently sent in a prescription. We have been using Hydrofera Blue. ABI's update by Dr. Bridgett Larsson right 0.62, left 0.89. Monophasic wave forms 06/25/17 on evaluation today patient appears to be doing well in regard to her right great toe wound. She is still taking the doxycycline as prescribed by Dr. Ola Spurr. The Hydrofera Blue Dressing's to be doing as well as anything has for her up to this point and we are still waiting on an insurance authorization for the [place]. She is having no pain which is good news. No fevers, chills, nausea, or vomiting noted at this time. 07/02/17; still taking doxycycline as prescribed by Dr. Ola Spurr. Hydrofera Blue continues. We have  no palpable bone. Still have not had had approval of Apligraf 08/06/16; the patient arrives today with [facility] even though we apparently had changed to Sorbact last time. Her co-pay for Regranex is $389 in discussion with the patient and her husband this was excessive. We'll continue with the Sorbact and standard dressings for now 08/20/17; we have been using sorbact. The wound bed still requires debridement. Wound measurements are slightly better. 09/03/17;using Sorbact.no debridement today. Wound measurements slightly better. There is some discoloration of the tip of her toelooks like bruising 09/17/17; using Sorbact. No debridement today. Wound dimensions are about the same. Still some discoloration at the tip of her great toe. This does not look any worse than last time 10/01/17; using sorbact right great toe I thought she might have surface epithelialization last time although it is certainly not look like that today nevertheless her dimensions are better. Continued surface eschar on the tip of her toe but this is not Shorb, [name] ([id number]) progressing. She is actually complaining of the left great toe 10/15/17; this is a very difficult area on the dorsal aspect of her proximal right great toe. At one point this wound had exposed bone however we have managed to get some degree of epithelialization. She was revascularized by Dr. Vallarie Mare in [place]. She saw Dr. Ola Spurr for underlying osteomyelitis. She is not a candidate for advanced treatment options [insurance issues]. She comes in today with a new area on the tip of her right great toe. The exact history here is unclear. We have been using sorbact Electronic Signature(s) Signed: 10/15/2017 5:42:24 PM By: Linton Ham MD Entered By: Linton Ham on 10/15/2017 11:05:37 [name], [name] ([id number]) -------------------------------------------------------------------------------- Physical Exam Details Patient Name: Thursby, Deaundra  V. Date of Service: 10/15/2017 10:00 AM Medical Record Number: [id number] Patient Account Number: [id number] Date of Birth/Sex: 30-Aug-1940 (77 y.o. Female) Treating RN: Cornell Barman Primary Care Provider: Beverlyn Roux Other Clinician: Referring Provider: Beverlyn Roux Treating Provider/Extender: Tito Dine in Treatment: 12 Constitutional Sitting or standing Blood Pressure is within target range for patient.. Pulse regular and within target range for patient.[name] Respirations regular, non-labored and within  target range.. Temperature is normal and within the target range for the patient.[name] appears in no distress. Notes wound exam; #1 original wound is on the dorsal right great toe. We've had improvement in dimensions of this wound. There is no longer exposed bone. Very gritty surface debrided with a #3 curet hemostasis with direct pressure. #2 she has a new area on the tip of the right great toe. Again a gritty adherent surface debridement with a #3 curet hemostasis with direct pressure. The exact cause of this isn't clear Electronic Signature(s) Signed: 10/15/2017 5:42:24 PM By: Linton Ham MD Entered By: Linton Ham on 10/15/2017 11:09:24 [name], [name] ([id number]) -------------------------------------------------------------------------------- Physician Orders Details Patient Name: [name], [name] Date of Service: 10/15/2017 10:00 AM Medical Record Number: [id number] Patient Account Number: [id number] Date of Birth/Sex: 1941-03-09 (77 y.o. Female) Treating RN: Cornell Barman Primary Care Provider: Beverlyn Roux Other Clinician: Referring Provider: Beverlyn Roux Treating Provider/Extender: Tito Dine in Treatment: 78 Verbal / Phone Orders: No Diagnosis Coding Wound Cleansing Wound #11 Toe Great o Clean wound with Normal Saline. o Cleanse wound with mild soap and water Wound #7 Right,Dorsal Metatarsal head first o Clean wound with Normal Saline. o  Cleanse wound with mild soap and water Anesthetic (add to Medication List) Wound #11 Toe Great o Topical Lidocaine 4% cream applied to wound bed prior to debridement (In Clinic Only). Wound #7 Right,Dorsal Metatarsal head first o Topical Lidocaine 4% cream applied to wound bed prior to debridement (In Clinic Only). Primary Wound Dressing Wound #11 Toe Great o Other: - Sorbact Wound #7 Right,Dorsal Metatarsal head first o Other: - Sorbact Secondary Dressing Wound #11 Toe Great o ABD pad o Conform/Kerlix Wound #7 Right,Dorsal Metatarsal head first o ABD pad o Conform/[place] Dressing Change Frequency Wound #11 Toe Great o Change Dressing Monday, Wednesday, Friday Wound #7 Right,Dorsal Metatarsal head first o Change Dressing Monday, Wednesday, Friday Follow-up Appointments Wound #11 Toe Great o Return Appointment in 2 weeks. [name], [name] ([id number]) Wound #7 Right,Dorsal Metatarsal head first o Return Appointment in 2 weeks. Edema Control Wound #11 Toe Great o Elevate legs to the level of the heart and pump ankles as often as possible Wound #7 Right,Dorsal Metatarsal head first o Elevate legs to the level of the heart and pump ankles as often as possible Additional Orders / Instructions Wound #11 Toe Great o Increase protein intake. o Activity as tolerated Wound #7 Right,Dorsal Metatarsal head first o Increase protein intake. o Activity as tolerated Home Health Wound #11 [facility] Visits - [facility] twice weekly, [facility]-Wednesday o Home Health Nurse may visit PRN to address patientos wound care needs. o FACE TO FACE ENCOUNTER: MEDICARE and MEDICAID PATIENTS: I certify that this patient is under my care and that I had a face-to-face encounter that meets the physician face-to-face encounter requirements with this patient on this date. The encounter with the patient was in whole or in part for the  following MEDICAL CONDITION: (primary reason for [place]) MEDICAL NECESSITY: I certify, that based on my findings, NURSING services are a medically necessary home health service. HOME BOUND STATUS: I certify that my clinical findings support that this patient is homebound (i.e., Due to illness or injury, pt requires aid of supportive devices such as crutches, cane, wheelchairs, walkers, the use of special transportation or the assistance of another person to leave their place of residence. There is a normal inability to leave the home and doing so  requires considerable and taxing effort. Other absences are for medical reasons / religious services and are infrequent or of short duration when for other reasons). o If current dressing causes regression in wound condition, may D/C ordered dressing product/s and apply Normal Saline Moist Dressing daily until next [place] / Other MD appointment. [place] of regression in wound condition at [phone]. o Please direct any NON-WOUND related issues/requests for orders to patient's Primary Care Physician Wound #7 Right,Dorsal Metatarsal head first o [facility] Visits - [facility] twice weekly, [facility]-Wednesday o Home Health Nurse may visit PRN to address patientos wound care needs. o FACE TO FACE ENCOUNTER: MEDICARE and MEDICAID PATIENTS: I certify that this patient is under my care and that I had a face-to-face encounter that meets the physician face-to-face encounter requirements with this patient on this date. The encounter with the patient was in whole or in part for the following MEDICAL CONDITION: (primary reason for [place]) MEDICAL NECESSITY: I certify, that based on my findings, NURSING services are a medically necessary home health service. HOME BOUND STATUS: I certify that my clinical findings support that this patient is homebound (i.e., Due to illness or injury, pt  requires aid of supportive devices such as crutches, cane, wheelchairs, walkers, the use of special transportation or the assistance of another person to leave their place of residence. There is a normal inability to leave the home and doing so requires considerable and taxing effort. Other absences are for medical reasons / religious services and are infrequent or of short duration when for other reasons). o If current dressing causes regression in wound condition, may D/C ordered dressing product/s and apply Normal Saline Moist Dressing daily until next [place] / Other MD appointment. [place] of regression in wound condition at [phone]. o Please direct any NON-WOUND related issues/requests for orders to patient's Primary Care Physician DEREK, HUNEYCUTT ([id number]) Medications-please add to medication list. Wound #11 Toe Great o P.O. Antibiotics - continue antibiotics as prescribed Wound #7 Right,Dorsal Metatarsal head first o P.O. Antibiotics - continue antibiotics as prescribed Electronic Signature(s) Signed: 10/15/2017 5:38:59 PM By: Gretta Cool, BSN, RN, CWS, Kim RN, BSN Signed: 10/15/2017 5:42:24 PM By: Linton Ham MD Entered By: Gretta Cool, BSN, RN, CWS, Kim on 10/15/2017 10:34:21 [name], [name] ([id number]) -------------------------------------------------------------------------------- Problem List Details Patient Name: Reesman, Alyana V. Date of Service: 10/15/2017 10:00 AM Medical Record Number: [id number] Patient Account Number: [id number] Date of Birth/Sex: 1940-09-15 (77 y.o. 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 Date of Service: 10/15/2017 10:00 AM Medical Record Number: [id number] Patient Account Number: [id number] Date of Birth/Sex: 1941/05/22 (77 y.o. Female) Treating RN: Cornell Barman Primary Care Provider: Beverlyn Roux Other Clinician: Referring Provider: Beverlyn Roux Treating Provider/Extender: Tito Dine in Treatment: 49 Subjective History of Present Illness (HPI) 08/13/16: This is a 77 year old woman who came predominantly for review of 3 cm in diameter circular wound to the left anterior lateral leg. She was in the ER on 08/01/16 I reviewed their notes. There was apparently pus coming out of the wound at that time and the patient arrived requesting debridement which they don't do in the emergency room. Nevertheless I can't see that they did any x-rays. There were no cultures done. She is a type II diabetic and I a note after the patient was in the clinic that she had a bypass graft from the popliteal to the tibial on the right on 02/28/16. She also had a right greater saphenous  vein harvest on the same date for arterial bypass. She is going to have vascular studies including ABIs T ABIs on the right on 08/28/16. The patient's surgery was on 02/28/16 by Dr. Vallarie Mare she had a right below the knee popliteal artery to peroneal artery bypass with reverse greater saphenous vein and an endarterectomy of the mid segment peroneal artery. Postoperatively she had a strong mild monophasic peroneal signal with a pink foot. It would appear that the patient is had some nonhealing in the surgical saphenous vein harvest site on the left leg. Surprisingly looking [facility] I cannot see much information about this at all. Dr. Lucious Groves notes from 05/29/16 show that the patient's wounds "are not healed" the right first metatarsal wound healed but then opened back up. 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 At that point the patient had a wound VAC to her right calf with regards to the wounds on her dorsal right toe would appear that these are felt to be arterial wounds. She has had surgery on the metatarsal phalangeal in 2015 I Dr. Doran Durand secondary to a right metatarsal phalangeal joint fracture. She is apparently had discoloration around this area since then. 08/28/16; patient arrives with her wounds in much the same condition. The linear vein harvest site and the circular wound below it which I think was a blister. She also has to probing holes in her right great toe and a necrotic eschar on the right second toe. Because of these being arterial wounds I reduced her compression from 3-2 layers this seems to of done satisfactorily she has not had any problems. I cannot see that she is actually had an x-ray ====== 11/06/16 the patient comes in for evaluation of her right lower extremity ulcers. She was here in January 2018 for 2 visits subsequently ended up in the hospital with  pneumonia and then to rehabilitation. She has now been discharged from rehabilitation and is home. She has multiple ulcerations to her right lower extremity including the foot and toes. She does have home health in place and they have been placing alginate to the ulcers. She is followed by Dr. Bridgett Larsson of vascular medicine. 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 She recently saw him on 3/23. In office ABIs were: Right 0.48 with monophasic flow to the DP, PT, peroneal Left 0.63  with monophasic flow to the DP and PT Her arterial studies indicated a patent right below knee popliteal to peroneal bypass She had an MRI in February 2008 that was negative frosty myelitis but this showed general soft tissue edema in the right foot and lower extremity concerning for cellulitis She is a diabetic, managed with insulin. Her hemoglobin A1c in December 2017 was 8.4 which is a trend up from previous levels. She had blood work in February 2018 which revealed an albumin of 2.6 this appears to be relatively acute as an albumin in November 2017 was 3.7 11/13/16; this is a patient I have not seen since February who is readmitted to our clinic last week. 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 Bridgett Larsson. I have reviewed Dr. Lianne Moris notes from March/23/18. Doppler ABI on that date showed an ABI on the right of 0.48 and on the left of 0.63. Dorsalis pedis waveforms were monophasic bilaterally. There was no waveforms detected at the posterior tibial on the right, monophasic on the left. Dr. Lianne Moris comments were that this patient would have follow-up vascular studies in 3 months including ABIs and right lower extremity arterial duplex. She had an MRI in February that was negative for osteomyelitis but showed generalized edema in Zamor, Ozell V. ([id number]) the foot. Last albumin I see was  in January at 3.4 we have been using Santyl to the 4 wounds on the right leg the patient is noted today to have widespread edema well up towards her groin this is pitting 2-3+. 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 Normal ejection fraction. 11/20/16; patient has a follow-up appointment with Dr. Bridgett Larsson on April 23. She is still complaining of a lot of pain in the right foot and right leg. It is not clear to me that this is at all positional however I think it is clear claudication with minimal activity perhaps at rest. At our suggestion she is return to her primary physician's office tomorrow with regards to her pitting lower extremity bilateral edema that I reviewed in detail last week 11/27/16; the follow-up with Dr. Bridgett Larsson was actually on May 23 on April 23 as I stated in my note last week. N/A case all of her wounds seems somewhat smaller. The 2 on the right leg are definitely smaller. The areas on the dorsal right first toe, right third toe and the lateral part of the right fifth metatarsal head all looks smaller but have tightly adherent surfaces. We have been using Santyl 12/04/16; follow-up with Dr. Bridgett Larsson on May 23. 2 small open wounds on the right leg continue to get smaller. The area on the right third total lateral aspect of the right fifth toe also look better. The remaining area on the dorsal first toe still has some depth to it. We have been using Santyl to the toes and collagen on the right 12/11/16; according to patient's husband the follow-up with Dr. Bridgett Larsson is not until July. All of the wounds on the right leg are measuring smaller. We have been using some combination of Prisma and Santyl although I think we can go to straight Prisma today. There may have been some confusion with home health about the primary dressing orders here. 12/25/16; the patient has had some healing this week. The area on her right lateral fifth metatarsal  head, right third toe are both healed lower right  leg is healed. In the vein harvest site superiorly she has one superficial open area. On the dorsal aspect of her right great toe/MTP joint the wound is now divided into 2 however the proximal area is deep and there is palpable bone 01/01/17; still an open area in the middle of her original right surgical scar. The area on the right third toe and right lateral fifth metatarsal head remained closed. Problematic area on the dorsal aspect of the toe. Previous surgery in this area line 01/08/17 small open area on the original scar on her upper anterior leg although this is closing. X-ray I did of the right first toe did not show underlying bony abnormality. Still this area on the dorsal first toe probes to bone. We have been using Prisma 01/15/17; small open area in the original scar in the upper anterior leg is almost fully closed. She has 2 open areas over the dorsal aspect of the right first toe that probes to bone. Used and a form starting last week. Vigorous bone scraping that I did last week showed few methicillin sensitive staph aureus. She is allergic to penicillin and sulfa drugs. I'm going to give her 2 weeks of doxycycline. She will need an MRI with contrast. 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 She continues on doxycycline for a bone scraping that showed methicillin sensitive staph aureus [allergic to penicillin and sulfa]. We have been using Endo form to the wound. 01/29/17; surprisingly her MRI did not show osteomyelitis. She continues on doxycycline for a bone scraping that showed methicillin sensitive staph aureus with allergies to penicillin and sulfa. We have been using Endo form to the wound. Unfortunately I cannot get a surface on this visit looks like it is able to support a healing state. Proximally there is still exposed bone. There is no overt soft  tissue tenderness. Her MRI did show a previous fracture was surgery to this area but no hardware 02/12/17 on evaluation today patient appears to be doing well in regard to her lower extremity wound. She does have some mild discomfort but this is minimal. There's no evidence of infection. Her left great toe nail has somewhat been lifted up and there is a little bit of slight bleeding underneath but this is still firmly attached. 02/19/17; she ran out of doxycycline 2 days ago. Nevertheless I would like to continue this for 2 weeks to make a full 6 weeks of therapy as the bone scraping that I did from the open area on the dorsal right first toe showed MRSA. This should complete antibiotic therapy. 02/26/17; the patient is completing her 6 weeks of oral doxycycline. Deep wound on the dorsal right toe. This still has some exposed bone proximally. She does have a reasonably granulated surface albeit thin over bone. I've been using Endo form have made application for an amniotic skin sub 03/05/17; the patient has completed 6 weeks of doxycycline. This is a deep wound on the dorsal right toe which has exposed bone proximally. She has granulation over most of this wound albeit a thin layer. I've been using Endo form. Still do not have approval for [employer] 03/13/17 on evaluation today patient's right foot wound appears to be doing better measurement wise compared to her last evaluation. She has been tolerating the dressing change without complication and does have a appointment with the dietary that has been made as far  as referral is concerned there just waiting for her and transportation to contact them back for an actual date. Nonetheless patient has been having less pain at this point. No fevers, chills, nausea, or vomiting noted at this time. 03/26/17; linear wound over the dorsal aspect of her right great toe. At one point this had exposed bone on the most superior aspect however I am pleased to see today  that this appears to have a surface of granulation. Apparently product was applied for but denied by [facility]. We'll need to see what that was. The patient has known PAD status post revascularization. MRI did not show osteomyelitis which was done in June 04/02/17; continued improvement using Endoform. She was approved for [facility] but hasn't over $387 co-pay per application, Borchardt, Zenna V. ([id number]) this is beyond her means 04/09/17; continues on Endoform. 04/16/17; appears to be doing nicely continuing on Endoform 04/22/17; patient did not have her dressing changed all week because of the weather. Using Endoform. Base of the wound looks healthy elbow there appears to events surrounding maceration perhaps because of the drainage was not changing the wound dressing 04/30/17 wound today continues to close and except for a small divot on the superior part of the wound. This area did not probe the bone but I found this a little concerning as this was the area with exposed bone before we be able to get this to granulate forward. As I remember things she is not a candidate for skin substitutes secondary to an outlandish co-pay. I asked her husband to look into the out of pocket max for medications if he has 1 [Regranex] or totally for skin substitutes 05/07/17; the patient arrives today with a wound roughly the same size although under careful inspection under the light there appears to be some epithelialization. Her intake nurse noted that the Endoform seemed to be placed over the wound rather than in the deeper divots they could really benefit from the Endoform. At this point I have no plans to change the Endoform as at one point this was at least 33% exposed bone and the rest of the wound very close to the underlying phalanx 05/14/17; no major change in the size or appearance of the wound. We have been using Endoform for a prolonged period of time with reasonably good improvement in the epithelialization  i.e. no exposed bone especially proximally. Unless we've not made a lot of changes in the last several weeks. She does not complain of pain. She was revascularized early this year or perhaps late last year by Dr. Bridgett Larsson and I wonder if he will need to have a look at her, I will need to review the actual vascular procedure which I think was a distal popliteal bypass 05/21/17; switched to either for a blue last week. Patient arrived in clinic with the wound not measuring any better but the surface looking some better. Unfortunately she had some surface slough and when I went to remove this superiorly she had exposed bone. Previously she had had exposed bone in the inferior part of the wound however this is granulated over some weeks ago. Clearly this is a major step back for her. With some difficulty I was able to obtain a piece of the bone probing through the superior part of the wound for culture. We did not send pathology. It is likely that she is going to need imaging of this site but I did not order this today 05/28/17; bone culture I took last week  showed MSSA. She still has exposed bone however I could not get another piece for pathology. She had an MRI 4 months ago that did not show osteoarthritic at time. Wound rapidly deteriorated superiorly and I suspect this is underlying osteomyelitis. We have been using Hydrofera Blue 06/04/17 on evaluation today patient's wound appears to actually be doing a little bit better visually compared to last week's evaluation which is good news. Nonetheless she does have osteomyelitis of the toe which does not appear to be MRSA. No fevers, chills, nausea, or vomiting noted at this time. She is having no discomfort. 06/11/17; patient's wound looks a little healthier than I remember seeing it 2 weeks ago. There is no exposed bone and the surface of the wound appears well granulated. We've been using hydrofera blue. I note that she was started on doxycycline which  was MSSA. Her appointment with Dr. Ola Spurr is on November 12 I believe. 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 Vallarie Mare of vascular surgery on 11/9. He offered right leg angiography possible intervention however the patient refused. I've discussed this with the patient's husband today she did not want any further procedures. Also noteworthy that Dr. Vallarie Mare stated that this wound had not healed in 3 years, I was not aware of this degree of chronicity in this area. She has underlying osteomyelitis by bone culture. Culture of this grew MSSA, she is allergic to penicillin and therefore not a candidate for beta lactams. She saw Dr. Ola Spurr of infectious disease this morning, he continued her on doxycycline for another month and apparently sent in a prescription. We have been using Hydrofera Blue. ABI's update by Dr. Bridgett Larsson right 0.62, left 0.89. Monophasic wave forms 06/25/17 on evaluation today patient appears to be doing well in regard to her right great toe wound. She is still taking the doxycycline as prescribed by Dr. Ola Spurr. The Hydrofera Blue Dressing's to be doing as well as anything has for her up to this point and we are still waiting on an insurance authorization for the [place]. She is having no pain which is good news. No fevers, chills, nausea, or vomiting noted at this time. 07/02/17; still taking doxycycline as prescribed by Dr. Ola Spurr. Hydrofera Blue continues. We have no palpable bone. Still have not had had approval of Apligraf 08/06/16; the patient arrives today with [facility] even though we apparently had changed to Sorbact last time. Her co-pay for Regranex is $389 in discussion with the patient and her husband this was excessive. We'll continue with the Sorbact and standard dressings for now 08/20/17; we have been using sorbact. The wound bed still requires debridement. Wound measurements are slightly better. 09/03/17;using Sorbact.no  debridement today. Wound measurements slightly better. There is some discoloration of the tip of her toelooks like bruising 09/17/17; using Sorbact. No debridement today. Wound dimensions are about the same. Still some discoloration at the tip of her great toe. This does not look any worse than last time 10/01/17; using sorbact right great toe I thought she might have surface epithelialization last time although it is certainly not [name], Shaylah V. ([id number]) look like that today nevertheless her dimensions are better. Continued surface eschar on the tip of her toe but this is not progressing. She is actually complaining of the left great toe 10/15/17; this is a very difficult area on the dorsal aspect of her proximal right great toe. At one point this wound had exposed bone however we have managed to get some degree  of epithelialization. She was revascularized by Dr. Vallarie Mare in [place]. She saw Dr. Ola Spurr for underlying osteomyelitis. She is not a candidate for advanced treatment options [insurance issues]. She comes in today with a new area on the tip of her right great toe. The exact history here is unclear. We have been using sorbact Objective Constitutional Sitting or standing Blood Pressure is within target range for patient.. Pulse regular and within target range for patient.[name] Respirations regular, non-labored and within target range.. Temperature is normal and within the target range for the patient.[name] appears in no distress. Vitals Time Taken: 10:08 AM, Height: 64 in, Weight: 200 lbs, BMI: 34.3, Temperature: 97.6 F, Pulse: 78 bpm, Respiratory Rate: 18 breaths/min, Blood Pressure: 119/92 mmHg. General Notes: wound exam; #1 original wound is on the dorsal right great toe. We've had improvement in dimensions of this wound. There is no longer exposed bone. Very gritty surface debrided with a #3 curet hemostasis with direct pressure. #2 she has a new area on the tip of the right great  toe. Again a gritty adherent surface debridement with a #3 curet hemostasis with direct pressure. The exact cause of this isn't clear Integumentary (Hair, Skin) Wound #11 status is Open. Original cause of wound was Gradually Appeared. The wound is located on the [employer] The wound measures 0.3cm length x 0.5cm width x 0.1cm depth; 0.118cm^2 area and 0.012cm^3 volume. There is no tunneling or undermining noted. There is a large amount of serosanguineous drainage noted. The wound margin is distinct with the outline attached to the wound base. There is medium (34-66%) red granulation within the wound bed. There is a medium (34- 66%) amount of necrotic tissue within the wound bed including Adherent Slough. Periwound temperature was noted as No Abnormality. The periwound has tenderness on palpation. Wound #7 status is Open. Original cause of wound was Gradually Appeared. The wound is located on the Right,Dorsal Metatarsal head first. The wound measures 0.5cm length x 1.5cm width x 0.2cm depth; 0.589cm^2 area and 0.118cm^3 volume. There is Fat Layer (Subcutaneous Tissue) Exposed exposed. There is no tunneling or undermining noted. There is a medium amount of serous drainage noted. The wound margin is flat and intact. There is large (67-100%) red granulation within the wound bed. There is a small (1-33%) amount of necrotic tissue within the wound bed including Adherent Slough. The periwound skin appearance exhibited: Induration, Hemosiderin Staining. The periwound skin appearance did not exhibit: Callus, Crepitus, Excoriation, Rash, Scarring, Dry/Scaly, Maceration, Atrophie Blanche, Cyanosis, Ecchymosis, Mottled, Pallor, Rubor, Erythema. Periwound temperature was noted as No Abnormality. The periwound has tenderness on palpation. 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 There was a Skin/Subcutaneous Tissue Debridement ([id number]) debridement with total area of 0.15 sq cm performed by ROBSON, Memory Argue,  MD. with the following instrument(s): Curette to remove Viable and Non-Viable tissue/material including Subcutaneous after achieving pain control using Other (liodicane 4%). A time out was conducted at 10:30, prior to the start of the procedure. A Moderate amount of bleeding was controlled with Pressure. The procedure was tolerated well with a pain level of 0 throughout and a pain level of 0 following the procedure. Post Debridement Measurements: 0.3cm length x 0.5cm width x 0.2cm depth; 0.024cm^3 volume. Character of Wound/Ulcer Post Debridement is stable. Severity of Tissue Post Debridement is: Fat layer exposed. Post procedure Diagnosis Wound #11: Same as Pre-Procedure Wound #7 Pre-procedure diagnosis of Wound #7 is a Diabetic Wound/Ulcer of the Lower Extremity located on the Right,Dorsal Metatarsal head first .Severity of Tissue Pre Debridement is: Fat layer exposed. There was a Skin/Subcutaneous Tissue Debridement ([id number]) debridement with total area of 0.75 sq cm performed by Ricard Dillon, MD. with the following instrument(s): Curette to remove Viable and Non-Viable  tissue/material including Subcutaneous after achieving pain control using Other (liodicane 4%). A time out was conducted at 10:30, prior to the start of the procedure. A Moderate amount of bleeding was controlled with Pressure. The procedure was tolerated well with a pain level of 0 throughout and a pain level of 0 following the procedure. Post Debridement Measurements: 0.5cm length x 1.5cm width x 0.2cm depth; 0.118cm^3 volume. Character of Wound/Ulcer Post Debridement is stable. Severity of Tissue Post Debridement is: Fat layer exposed. Post procedure Diagnosis Wound #7: Same as Pre-Procedure Plan Wound Cleansing: Wound #11 Toe Great: Clean wound with Normal Saline. Cleanse wound with mild soap and water Wound #7 Right,Dorsal Metatarsal head first: Clean wound with Normal Saline. Cleanse wound with mild soap and water Anesthetic (add to Medication List): Wound #11 Toe Great: Topical Lidocaine 4% cream applied to wound bed prior to debridement (In Clinic Only). Wound #7 Right,Dorsal Metatarsal head first: Holecek, Anique V. ([id number]) Topical Lidocaine 4% cream applied to wound bed prior to debridement (In Clinic Only). Primary Wound Dressing: Wound #11 Toe Great: Other: - Sorbact Wound #7 Right,Dorsal Metatarsal head first: Other: - Sorbact Secondary Dressing: Wound #11 Toe Great: ABD pad Conform/Kerlix Wound #7 Right,Dorsal Metatarsal head first: ABD pad Conform/Kerlix Dressing Change Frequency: Wound #11 Toe Great: Change Dressing Monday, Wednesday, Friday Wound #7 Right,Dorsal Metatarsal head first: Change Dressing Monday, Wednesday, Friday Follow-up Appointments: Wound #11 Toe Great: Return Appointment in 2 weeks. Wound #7 Right,Dorsal Metatarsal head first: Return Appointment in 2 weeks. Edema Control: Wound #11 Toe Great: Elevate legs to the level of the heart and pump ankles as often as possible Wound #7 Right,Dorsal Metatarsal head first: Elevate legs to the  level of the heart and pump ankles as often as possible Additional Orders / Instructions: Wound #11 Toe Great: Increase protein intake. Activity as tolerated Wound #7 Right,Dorsal Metatarsal head first: Increase protein intake. Activity as tolerated Home Health: Wound #11 Toe Great: [place] [facility], [facility] Health Nurse may visit PRN to address patient s wound care needs. FACE TO FACE ENCOUNTER: MEDICARE and MEDICAID PATIENTS: I certify that this patient is under my care and that I had a face-to-face encounter that meets the physician face-to-face encounter requirements with this patient on this date. The encounter with the patient was in whole or in part for the following MEDICAL CONDITION: (primary reason for [place]) MEDICAL NECESSITY: I certify, that based on my findings, NURSING services are a medically necessary home health service. HOME  BOUND STATUS: I certify that my clinical findings support that this patient is homebound (i.e., Due to illness or injury, pt requires aid of supportive devices such as crutches, cane, wheelchairs, walkers, the use of special transportation or the assistance of another person to leave their place of residence. There is a normal inability to leave the home and doing so requires considerable and taxing effort. Other absences are for medical reasons / religious services and are infrequent or of short duration when for other reasons). If current dressing causes regression in wound condition, may D/C ordered dressing product/s and apply Normal Saline Moist Dressing daily until next [place] / Other MD appointment. [place] of regression in wound condition at [phone]. Please direct any NON-WOUND related issues/requests for orders to patient's Primary Care Physician Wound #7 Right,Dorsal Metatarsal head first: [place] [facility]  weekly, [facility] Home Health Nurse may visit [facility] to address patient s wound care needs. FACE TO FACE ENCOUNTER: MEDICARE and MEDICAID PATIENTS: I certify that this patient is under my care and that I had a face-to-face encounter that meets the physician face-to-face encounter requirements with this patient on this date. The encounter with the patient was in whole or in part for the following MEDICAL CONDITION: (primary reason for [place]) MEDICAL NECESSITY: I certify, that based on my findings, NURSING services are a medically necessary home health service. HOME BOUND STATUS: I certify that my clinical findings support that this patient is homebound (i.e., Due to [employer], CARMYN HAMM. ([id number]) illness or injury, pt requires aid of supportive devices such as crutches, cane, wheelchairs, walkers, the use of special transportation or the assistance of another person to leave their place of residence. There is a normal inability to leave the home and doing so requires considerable and taxing effort. Other absences are for medical reasons / religious services and are infrequent or of short duration when for other reasons). If current dressing causes regression in wound condition, may D/C ordered dressing product/s and apply Normal Saline Moist Dressing daily until next [place] / Other MD appointment. [place] of regression in wound condition at [phone]. Please direct any NON-WOUND related issues/requests for orders to patient's Primary Care Physician Medications-please add to medication list.: Wound #11 Toe Great: P.O. Antibiotics - continue antibiotics as prescribed Wound #7 Right,Dorsal Metatarsal head first: P.O. Antibiotics - continue antibiotics as prescribed #1 we'll continue sorbact of both wound areas #2 the patient has known PAD status post revascularization by Dr. Vallarie Mare at [facility] [place] this looks more  like trauma/footwear issue although I don't have any history here. #3 he may need to look at x-raying the toe especially if things continue to deteriorate. #4 the original wound had exposed bone which we managed to get to granulate however only very slight improvement in dimensions of late Electronic Signature(s) Signed: 10/15/2017 5:42:24 PM By: Linton Ham MD Entered By: Linton Ham on 10/15/2017 11:10:44 Wrage, Mariellen V. ([id number]) -------------------------------------------------------------------------------- SuperBill Details Patient Name: Mitrano, Shia V. Date of Service: 10/15/2017 Medical Record Number: [id number] Patient Account Number: [id number] Date of Birth/Sex: 1941/03/04 (77 y.o. 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 I70.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, right ankle and foot Facility Procedures CPT4 Code Description: [id number] 11042 - DEB SUBQ TISSUE 20 SQ CM/< ICD-10 Diagnosis Description E11.621 Type 2 diabetes mellitus with foot ulcer L97.519 Non-pressure chronic ulcer of other part of right foot with unspe Modifier: cified severit Quantity: 1 y Physician Procedures CPT4 Code Description: [id number] 11042 - WC PHYS SUBQ TISS 20 SQ CM ICD-10 Diagnosis  Description E11.621 Type 2 diabetes mellitus with foot ulcer L97.519 Non-pressure chronic ulcer of other part of right foot with unspec Modifier: ified severit Quantity: 1 y Engineer, maintenance) Signed: 10/15/2017 5:42:24 PM By: Linton Ham MD Entered By: Linton Ham on 10/15/2017 11:11:24

## 2017-10-20 NOTE — Progress Notes (Signed)
MARKELLA, DAO ([id number]) Visit Report for 10/15/2017 Arrival Information Details Patient Name: Ballman, MARVELLE SPAN. Date of Service: 10/15/2017 10:00 AM Medical Record Number: [id number] Patient Account Number: [id number] Date of Birth/Sex: 1941-08-03 (77 y.o. Female) Treating RN: Carolyne Fiscal, Debi Primary Care Cherlyn Syring: Beverlyn Roux Other Clinician: Referring Murna Backer: Beverlyn Roux Treating Debroah Shuttleworth/Extender: Tito Dine in Treatment: 54 Visit Information History Since Last Visit All ordered tests and consults were completed: No Patient Arrived: Wheel Chair Added or deleted any medications: No Arrival Time: 10:05 Any new allergies or adverse reactions: No Accompanied By: husband Had a fall or experienced change in No Transfer Assistance: EasyPivot Patient activities of daily living that may affect Lift risk of falls: Patient Identification Verified: Yes Signs or symptoms of abuse/neglect since last visito No Secondary Verification Process Yes Hospitalized since last visit: No Completed: Has Dressing in Place as Prescribed: Yes Patient Requires Transmission-Based No Precautions: Pain Present Now: No Patient Has Alerts: No Electronic Signature(s) Signed: 10/15/2017 4:40:08 PM By: Alric Quan Entered By: Alric Quan on 10/15/2017 10:06:51 Torrez, Kelcee V. ([id number]) -------------------------------------------------------------------------------- Encounter Discharge Information Details Patient Name: Level, Wana V. Date of Service: 10/15/2017 10:00 AM Medical Record Number: [id number] Patient Account Number: [id number] Date of Birth/Sex: 12/28/1940 (77 y.o. Female) Treating RN: Cornell Barman Primary Care Mirai Greenwood: Beverlyn Roux Other Clinician: Referring Vennie Waymire: Beverlyn Roux Treating Stephinie Battisti/Extender: Tito Dine in Treatment: 39 Encounter Discharge Information Items Schedule Follow-up Appointment: No Medication Reconciliation completed  and No provided to Patient/Care Kieth Hartis: Provided on Clinical Summary of Care: 10/15/2017 Form Type Recipient Paper Patient [facility] Electronic Signature(s) Signed: 10/20/2017 2:38:37 PM By: Ruthine Dose Entered By: Ruthine Dose on 10/15/2017 10:54:54 Barretta, Kelley V. ([id number]) -------------------------------------------------------------------------------- Lower Extremity Assessment Details Patient Name: Sulak, Maija V. Date of Service: 10/15/2017 10:00 AM Medical Record Number: [id number] Patient Account Number: [id number] Date of Birth/Sex: 22-Jul-1941 (76 y.o. Female) Treating RN: Carolyne Fiscal, Debi Primary Care Jaquavian Firkus: Beverlyn Roux Other Clinician: Referring Sinia Antosh: Beverlyn Roux Treating See Beharry/Extender: Tito Dine in Treatment: 6 Vascular Assessment Pulses: Dorsalis Pedis Palpable: [Right:Yes] Posterior Tibial Extremity colors, hair growth, and conditions: Extremity Color: [Right:Hyperpigmented] Temperature of Extremity: [Right:Cool] Capillary Refill: [Right:< 3 seconds] Toe Nail Assessment Left: Right: Thick: Yes Discolored: Yes Deformed: Yes Improper Length and Hygiene: Yes Electronic Signature(s) Signed: 10/15/2017 4:40:08 PM By: Alric Quan Entered By: Alric Quan on 10/15/2017 10:20:25 Schar, Dhalia V. ([id number]) -------------------------------------------------------------------------------- Multi Wound Chart Details Patient Name: Trebilcock, Taralynn V. Date of Service: 10/15/2017 10:00 AM Medical Record Number: [id number] Patient Account Number: [id number] Date of Birth/Sex: 29-Jan-1941 (77 y.o. 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] [7:Lower Extremity] Comorbid History: [11:Cataracts, Chronic sinus problems/congestion, Congestive Heart Failure, Hypertension, Type II Diabetes Hypertension, Type II Diabetes] [7:Cataracts, Chronic sinus problems/congestion, Congestive Heart Failure,] [N/A:N/A] Date Acquired: [11:10/15/2017] [7:08/06/2013] [N/A:N/A] Weeks of Treatment: [11:0] [7:49] [N/A:N/A] Wound Status: [11:Open] [7:Open] [N/A:N/A] Measurements L x W x D [11:0.3x0.5x0.1] [7:0.5x1.5x0.2] [N/A:N/A] (cm) Area (cm) : [11:0.118] [7:0.589] [N/A:N/A] Volume (cm) : [11:0.012] [7:0.118] [N/A:N/A] % Reduction in Area: [11:N/A] [7:50.00%] [N/A:N/A] % Reduction in Volume: [11:N/A] [7:66.60%] [  N/A:N/A] Classification: [11:Grade 2] [7:Grade 2] [N/A:N/A] Exudate Amount: [11:Large] [7:Medium] [N/A:N/A] Exudate Type: [11:Serosanguineous] [7:Serous] [N/A:N/A] Exudate Color: [11:red, brown] [7:amber] [N/A:N/A] Wound Margin: [11:Distinct, outline attached] [7:Flat and Intact] [N/A:N/A] Granulation Amount: [11:Medium (34-66%)] [7:Large (67-100%)] [N/A:N/A] Granulation Quality: [11:Red] [7:Red] [N/A:N/A] Necrotic Amount: [11:Medium (34-66%)] [7:Small (1-33%)] [N/A:N/A] Exposed Structures: [11:Fascia: No Fat Layer (Subcutaneous Tissue) Exposed: No Tendon: No Muscle: No Joint: No Bone: No] [7:Fat Layer (Subcutaneous Tissue) Exposed: Yes Fascia: No Tendon: No Muscle: No Joint: No Bone: No] [N/A:N/A] Epithelialization: [11:None] [7:Small (1-33%)] [N/A:N/A] Debridement: [11:Debridement ([id number]) 10:30] [7:Debridement ([id number]) 10:30] [N/A:N/A N/A] Pre-procedure Verification/Time Out Taken: Pain Control: Other Other  N/A Tissue Debrided: Subcutaneous Subcutaneous N/A Level: Skin/Subcutaneous Tissue Skin/Subcutaneous Tissue N/A Debridement Area (sq cm): 0.15 0.75 N/A Instrument: Curette Curette N/A Bleeding: Moderate Moderate N/A Hemostasis Achieved: Pressure Pressure N/A Procedural Pain: 0 0 N/A Post Procedural Pain: 0 0 N/A Debridement Treatment Procedure was tolerated well Procedure was tolerated well N/A Response: Post Debridement 0.3x0.5x0.2 0.5x1.5x0.2 N/A Measurements L x W x D (cm) Post Debridement Volume: 0.024 0.118 N/A (cm) Periwound Skin Texture: No Abnormalities Noted Induration: Yes N/A Excoriation: No Callus: No Crepitus: No Rash: No Scarring: No Periwound Skin Moisture: No Abnormalities Noted Maceration: No N/A Dry/Scaly: No Periwound Skin Color: No Abnormalities Noted Hemosiderin Staining: Yes N/A Atrophie Blanche: No Cyanosis: No Ecchymosis: No Erythema: No Mottled: No Pallor: No Rubor: No Temperature: No Abnormality No Abnormality N/A Tenderness on Palpation: Yes Yes N/A Wound Preparation: Ulcer Cleansing: Ulcer Cleansing: N/A Rinsed/Irrigated with Saline Rinsed/Irrigated with Saline Topical Anesthetic Applied: Topical Anesthetic Applied: Other: lidocaine 4% Other: lidocaine 4% Procedures Performed: Debridement Debridement N/A Treatment Notes Electronic Signature(s) Signed: 10/15/2017 5:42:24 PM By: Linton Ham MD Entered By: Linton Ham on 10/15/2017 11:03:09 Polzin, [name] ([id number]) -------------------------------------------------------------------------------- Multi-Disciplinary Care Plan Details Patient Name: Minnis, Shakeena V. Date of Service: 10/15/2017 10:00 AM Medical Record Number: [id number] Patient Account Number: [id number] Date of Birth/Sex: May 17, 1941 (76 y.o. Female) Treating RN: Cornell Barman Primary Care Moya Duan: Beverlyn Roux Other Clinician: Referring Dacota Devall: Beverlyn Roux Treating Jakoby Melendrez/Extender: Tito Dine in  Treatment: 33 Active Inactive ` Abuse / Safety / Falls / Self Care Management Nursing Diagnoses: Impaired physical mobility Potential for falls Goals: Patient will remain injury free Date Initiated: 11/06/2016 Target Resolution Date: 12/30/2016 Goal Status: Active Patient/caregiver will verbalize understanding of skin care regimen Date Initiated: 11/06/2016 Target Resolution Date: 12/30/2016 Goal Status: Active Interventions: Assess fall risk on admission and as needed Treatment Activities: Patient referred to home care : 11/06/2016 Notes: ` Nutrition Nursing Diagnoses: Potential for alteratiion in Nutrition/Potential for imbalanced nutrition Goals: Patient/caregiver verbalizes understanding of need to maintain therapeutic glucose control per primary care physician Date Initiated: 11/06/2016 Target Resolution Date: 12/30/2016 Goal Status: Active Interventions: Provide education on elevated blood sugars and impact on wound healing Notes: ` Orientation to the Wound Care Program Nursing Diagnoses: Knowledge deficit related to the wound healing center program ANETTE, BARRA ([id number]) Goals: Patient/caregiver will verbalize understanding of the [place] Date Initiated: 11/06/2016 Target Resolution Date: 12/30/2016 Goal Status: Active Interventions: Provide education on orientation to the wound center Notes: ` Venous Leg Ulcer Nursing Diagnoses: Actual venous Insuffiency (use after diagnosis is confirmed) Knowledge deficit related to disease process and management Goals: Non-invasive venous studies are completed as ordered Date Initiated: 11/06/2016 Target Resolution Date: 12/30/2016 Goal Status: Active Patient/caregiver will verbalize understanding of disease process and disease management Date Initiated: 11/06/2016 Target Resolution Date: 12/30/2016 Goal Status: Active Interventions: Assess peripheral edema status  every visit. Notes: ` Wound/Skin  Impairment Nursing Diagnoses: Impaired tissue integrity Knowledge deficit related to smoking impact on wound healing Knowledge deficit related to ulceration/compromised skin integrity Goals: Ulcer/skin breakdown will heal within 14 weeks Date Initiated: 11/06/2016 Target Resolution Date: 02/05/2017 Goal Status: Active Interventions: Assess ulceration(s) every visit Treatment Activities: Skin care regimen initiated : 11/06/2016 Notes: Electronic Signature(s) Signed: 10/15/2017 5:38:59 PM By: Gretta Cool, BSN, RN, CWS, Kim RN, BSN Ehler, Darica V. ([id number]) Entered By: Gretta Cool, BSN, RN, CWS, Kim on 10/15/2017 10:30:53 Hertzog, [name] ([id number]) -------------------------------------------------------------------------------- Pain Assessment Details Patient Name: Weinhold, Kalla V. Date of Service: 10/15/2017 10:00 AM Medical Record Number: [id number] Patient Account Number: [id number] Date of Birth/Sex: 08-05-1941 (77 y.o. Female) Treating RN: Carolyne Fiscal, Debi Primary Care Alvena Kiernan: Beverlyn Roux Other Clinician: Referring Mekel Haverstock: Beverlyn Roux Treating Keyairra Kolinski/Extender: Tito Dine in Treatment: 65 Active Problems Location of Pain Severity and Description of Pain Patient Has Paino No Site Locations Pain Management and Medication Current Pain Management: Electronic Signature(s) Signed: 10/15/2017 4:40:08 PM By: Alric Quan Entered By: Alric Quan on 10/15/2017 10:07:03 Hochmuth, Dachelle V. ([id number]) -------------------------------------------------------------------------------- Wound Assessment Details Patient Name: Daffin, Kemiyah V. Date of Service: 10/15/2017 10:00 AM Medical Record Number: [id number] Patient Account Number: [id number] Date of Birth/Sex: [birth year] (76 y.o. Female) Treating RN: Carolyne Fiscal, Debi Primary Care Tayanna Talford: Beverlyn Roux Other Clinician: Referring Tonilynn Bieker: Beverlyn Roux Treating Luetta Piazza/Extender: Tito Dine in Treatment: 74 Wound  Status Wound Number: 11 Primary Diabetic Wound/Ulcer of the Lower Extremity Etiology: Wound Location: Toe Great Wound Open Wounding Event: Gradually Appeared Status: Date Acquired: 10/15/2017 Comorbid Cataracts, Chronic sinus problems/congestion, Weeks Of Treatment: 0 History: Congestive Heart Failure, Hypertension, Type II Clustered Wound: No Diabetes Photos Photo Uploaded By: Alric Quan on 10/15/2017 17:15:06 Wound Measurements Length: (cm) 0.3 Width: (cm) 0.5 Depth: (cm) 0.1 Area: (cm) 0.118 Volume: (cm) 0.012 % Reduction in Area: % Reduction in Volume: Epithelialization: None Tunneling: No Undermining: No Wound Description Classification: Grade 2 Wound Margin: Distinct, outline attached Exudate Amount: Large Exudate Type: Serosanguineous Exudate Color: red, brown Foul Odor After Cleansing: No Slough/Fibrino Yes Wound Bed Granulation Amount: Medium (34-66%) Exposed Structure Granulation Quality: Red Fascia Exposed: No Necrotic Amount: Medium (34-66%) Fat Layer (Subcutaneous Tissue) Exposed: No Necrotic Quality: Adherent Slough Tendon Exposed: No Muscle Exposed: No Joint Exposed: No Bone Exposed: No Periwound Skin Texture Quintanilla, Tujuana V. ([id number]) Texture Color No Abnormalities Noted: No No Abnormalities Noted: No Moisture Temperature / Pain No Abnormalities Noted: No Temperature: No Abnormality Tenderness on Palpation: Yes Wound Preparation Ulcer Cleansing: Rinsed/Irrigated with Saline Topical Anesthetic Applied: Other: lidocaine 4%, Electronic Signature(s) Signed: 10/15/2017 4:40:08 PM By: Alric Quan Entered By: Alric Quan on 10/15/2017 10:19:35 Witman, Lizza V. ([id number]) -------------------------------------------------------------------------------- Wound Assessment Details Patient Name: Stockburger, Kamylah V. Date of Service: 10/15/2017 10:00 AM Medical Record Number: [id number] Patient Account Number: [id number] Date of  Birth/Sex: 11-01-1940 (77 y.o. Female) Treating RN: Carolyne Fiscal, Debi Primary Care Joani Cosma: Beverlyn Roux Other Clinician: Referring Georgian Mcclory: Beverlyn Roux Treating Jamisen Duerson/Extender: Tito Dine in Treatment: 2 Wound Status Wound Number: 7 Primary Diabetic Wound/Ulcer of the Lower Extremity Etiology: Wound Location: Right Metatarsal head first - Dorsal Wound Open Wounding Event: Gradually Appeared Status: Date Acquired: 08/06/2013 Comorbid Cataracts, Chronic sinus problems/congestion, Weeks Of Treatment: 49 History: Congestive Heart Failure, Hypertension, Type II Clustered Wound: No Diabetes Photos Photo Uploaded By: Alric Quan on 10/15/2017 17:15:07 Wound Measurements Length: (cm) 0.5 Width: (cm) 1.5 Depth: (cm) 0.2 Area: (cm) 0.589 Volume: (cm) 0.118 % Reduction in Area: 50% %  Reduction in Volume: 66.6% Epithelialization: Small (1-33%) Tunneling: No Undermining: No Wound Description Classification: Grade 2 Wound Margin: Flat and Intact Exudate Amount: Medium Exudate Type: Serous Exudate Color: amber Foul Odor After Cleansing: No Slough/Fibrino Yes Wound Bed Granulation Amount: Large (67-100%) Exposed Structure Granulation Quality: Red Fascia Exposed: No Necrotic Amount: Small (1-33%) Fat Layer (Subcutaneous Tissue) Exposed: Yes Necrotic Quality: Adherent Slough Tendon Exposed: No Muscle Exposed: No Joint Exposed: No Bone Exposed: No Periwound Skin Texture Tokar, Lacinda V. ([id number]) Texture Color No Abnormalities Noted: No No Abnormalities Noted: No Callus: No Atrophie Blanche: No Crepitus: No Cyanosis: No Excoriation: No Ecchymosis: No Induration: Yes Erythema: No Rash: No Hemosiderin Staining: Yes Scarring: No Mottled: No Pallor: No Moisture Rubor: No No Abnormalities Noted: No Dry / Scaly: No Temperature / Pain Maceration: No Temperature: No Abnormality Tenderness on Palpation: Yes Wound Preparation Ulcer Cleansing:  Rinsed/Irrigated with Saline Topical Anesthetic Applied: Other: lidocaine 4%, Electronic Signature(s) Signed: 10/15/2017 4:40:08 PM By: Alric Quan Entered By: Alric Quan on 10/15/2017 10:17:33 Dodgen, Breeann V. ([id number]) -------------------------------------------------------------------------------- Vitals Details Patient Name: Panebianco, Tylynn V. Date of Service: 10/15/2017 10:00 AM Medical Record Number: [id number] Patient Account Number: [id number] Date of Birth/Sex: [birth year] (77 y.o. Female) Treating RN: Carolyne Fiscal, Debi Primary Care Skip Litke: Beverlyn Roux Other Clinician: Referring Alianny Toelle: Beverlyn Roux Treating Aurel Nguyen/Extender: Tito Dine in Treatment: 49 Vital Signs Time Taken: 10:08 Temperature (F): 97.6 Height (in): 64 Pulse (bpm): 78 Weight (lbs): 200 Respiratory Rate (breaths/min): 18 Body Mass Index (BMI): 34.3 Blood Pressure (mmHg): 119/92 Reference Range: 80 - 120 mg / dl Electronic Signature(s) Signed: 10/15/2017 4:40:08 PM By: Alric Quan Entered By: Alric Quan on 10/15/2017 10:11:07

## 2017-10-29 ENCOUNTER — Encounter: Payer: Medicare HMO | Admitting: Internal Medicine

## 2017-10-29 DIAGNOSIS — E11621 Type 2 diabetes mellitus with foot ulcer: Secondary | ICD-10-CM | POA: Diagnosis not present

## 2017-10-30 ENCOUNTER — Other Ambulatory Visit: Payer: Self-pay | Admitting: Family Medicine

## 2017-10-30 ENCOUNTER — Ambulatory Visit
Admission: RE | Admit: 2017-10-30 | Discharge: 2017-10-30 | Disposition: A | Discharge status: Home / Self Care | Payer: Medicare HMO | Source: Ambulatory Visit | Attending: Family Medicine | Admitting: Family Medicine

## 2017-10-30 DIAGNOSIS — R601 Generalized edema: Secondary | ICD-10-CM | POA: Diagnosis present

## 2017-10-30 DIAGNOSIS — J90 Pleural effusion, not elsewhere classified: Secondary | ICD-10-CM | POA: Insufficient documentation

## 2017-10-30 DIAGNOSIS — I10 Essential (primary) hypertension: Secondary | ICD-10-CM

## 2017-10-30 DIAGNOSIS — I119 Hypertensive heart disease without heart failure: Secondary | ICD-10-CM | POA: Diagnosis not present

## 2017-10-31 NOTE — Progress Notes (Signed)
NDEYE, White ([id number]) Visit Report for 10/29/2017 Arrival Information Details Patient Name: [name], [name] Date of Service: 10/29/2017 10:00 AM Medical Record Number: [id number] Patient Account Number: [id number] Date of Birth/Sex: 1940/10/12 (77 y.o. F) Treating RN: Roger Shelter Primary Care Purity Irmen: Beverlyn Roux Other Clinician: Referring [name] Tierce: Beverlyn Roux Treating Tyree Vandruff/Extender: Tito Dine in Treatment: 8 Visit Information History Since Last Visit All ordered tests and consults were completed: No Patient Arrived: Wheel Chair Added or deleted any medications: No Arrival Time: 10:04 Any new allergies or adverse reactions: No Accompanied By: daughter Had a fall or experienced change in No Transfer Assistance: EasyPivot Patient activities of daily living that may affect Lift risk of falls: Patient Identification Verified: Yes Signs or symptoms of abuse/neglect since last visito No Secondary Verification Process Yes Hospitalized since last visit: No Completed: Implantable device outside of the clinic excluding No Patient Requires Transmission-Based No cellular tissue based products placed in the center Precautions: since last visit: Patient Has Alerts: No Pain Present Now: No Electronic Signature(s) Signed: 10/29/2017 4:10:38 PM By: Roger Shelter Entered By: Roger Shelter on 10/29/2017 10:05:13 Dorrough, Kaniesha [name] ([id number]) -------------------------------------------------------------------------------- Clinic Level of Care Assessment Details Patient Name: [name], [name] Date of Service: 10/29/2017 10:00 AM Medical Record Number: [id number] Patient Account Number: [id number] Date of Birth/Sex: February 23, 1941 (77 y.o. F) Treating RN: Cornell Barman Primary Care Denim Start: Beverlyn Roux Other Clinician: Referring Pratt Bress: Beverlyn Roux Treating Keenen Roessner/Extender: Tito Dine in Treatment: 59 Clinic Level of Care Assessment Items TOOL 4  Quantity Score []  - Use when only an EandM is performed on FOLLOW-UP visit 0 ASSESSMENTS - Nursing Assessment / Reassessment X - Reassessment of Co-morbidities (includes updates in patient status) 1 10 []  - 0 Reassessment of Adherence to Treatment Plan ASSESSMENTS - Wound and Skin Assessment / Reassessment X - Simple Wound Assessment / Reassessment - one wound 1 5 []  - 0 Complex Wound Assessment / Reassessment - multiple wounds []  - 0 Dermatologic / Skin Assessment (not related to wound area) ASSESSMENTS - Focused Assessment []  - Circumferential Edema Measurements - multi extremities 0 []  - 0 Nutritional Assessment / Counseling / Intervention []  - 0 Lower Extremity Assessment (monofilament, tuning fork, pulses) []  - 0 Peripheral Arterial Disease Assessment (using hand held doppler) ASSESSMENTS - Ostomy and/or Continence Assessment and Care []  - Incontinence Assessment and Management 0 []  - 0 Ostomy Care Assessment and Management (repouching, etc.) PROCESS - Coordination of Care X - Simple Patient / Family Education for ongoing care 1 15 []  - 0 Complex (extensive) Patient / Family Education for ongoing care X- 1 10 Staff obtains Programmer, systems, Records, Test Results / Process Orders []  - 0 Staff telephones HHA, Nursing Homes / Clarify orders / etc []  - 0 Routine Transfer to another Facility (non-emergent condition) []  - 0 Routine Hospital Admission (non-emergent condition) []  - 0 New Admissions / Biomedical engineer / Ordering NPWT, Apligraf, etc. []  - 0 Emergency Hospital Admission (emergent condition) X- 1 10 Simple Discharge Coordination [name], [name] ([id number]) []  - 0 Complex (extensive) Discharge Coordination PROCESS - Special Needs []  - Pediatric / Minor Patient Management 0 []  - 0 Isolation Patient Management []  - 0 Hearing / Language / Visual special needs []  - 0 Assessment of Community assistance (transportation, D/C planning, etc.) []  - 0 Additional  assistance / Altered mentation []  - 0 Support Surface(s) Assessment (bed, cushion, seat, etc.) INTERVENTIONS - Wound Cleansing / Measurement X - Simple Wound Cleansing - one wound 1 5 []  -  0 Complex Wound Cleansing - multiple wounds X- 1 5 Wound Imaging (photographs - any number of wounds) []  - 0 Wound Tracing (instead of photographs) X- 1 5 Simple Wound Measurement - one wound []  - 0 Complex Wound Measurement - multiple wounds INTERVENTIONS - Wound Dressings []  - Small Wound Dressing one or multiple wounds 0 X- 1 15 Medium Wound Dressing one or multiple wounds []  - 0 Large Wound Dressing one or multiple wounds []  - 0 Application of Medications - topical []  - 0 Application of Medications - injection INTERVENTIONS - Miscellaneous []  - External ear exam 0 []  - 0 Specimen Collection (cultures, biopsies, blood, body fluids, etc.) []  - 0 Specimen(s) / Culture(s) sent or taken to Lab for analysis []  - 0 Patient Transfer (multiple staff / Civil Service fast streamer / Similar devices) []  - 0 Simple Staple / Suture removal (25 or less) []  - 0 Complex Staple / Suture removal (26 or more) []  - 0 Hypo / Hyperglycemic Management (close monitor of Blood Glucose) []  - 0 Ankle / Brachial Index (ABI) - do not check if billed separately X- 1 5 Vital Signs [name], [name] ([id number]) Has the patient been seen at the hospital within the last three years: Yes Total Score: 85 Level Of Care: New/Established - Level 3 Electronic Signature(s) Signed: 10/29/2017 5:06:18 PM By: Gretta Cool, BSN, RN, CWS, Kim RN, BSN Entered By: Gretta Cool, BSN, RN, CWS, Kim on 10/29/2017 10:39:17 Plair, [name] ([id number]) -------------------------------------------------------------------------------- Encounter Discharge Information Details Patient Name: [name], [name] Date of Service: 10/29/2017 10:00 AM Medical Record Number: [id number] Patient Account Number: [id number] Date of Birth/Sex: 14-Dec-1940 (77 y.o. F) Treating RN:  Cornell Barman Primary Care Theresea Trautmann: Beverlyn Roux Other Clinician: Referring Jaymar Loeber: Beverlyn Roux Treating Saralee Bolick/Extender: Tito Dine in Treatment: 24 Encounter Discharge Information Items Discharge Pain Level: 0 Discharge Condition: Stable Ambulatory Status: Wheelchair Discharge Destination: Home Transportation: Private Auto Accompanied By: dtr Schedule Follow-up Appointment: Yes Medication Reconciliation completed and No provided to Patient/Care Pearley Baranek: Provided on Clinical Summary of Care: 10/29/2017 Form Type Recipient Paper Patient [facility] Electronic Signature(s) Signed: 10/29/2017 11:36:38 AM By: Montey Hora Entered By: Montey Hora on 10/29/2017 11:36:38 Dowland, Araina V. ([id number]) -------------------------------------------------------------------------------- Lower Extremity Assessment Details Patient Name: Dohner, Ashawnti V. Date of Service: 10/29/2017 10:00 AM Medical Record Number: [id number] Patient Account Number: [id number] Date of Birth/Sex: 09/11/1940 (77 y.o. F) Treating RN: Roger Shelter Primary Care Santoria Chason: Beverlyn Roux Other Clinician: Referring Tyshawn Keel: Beverlyn Roux Treating Stiles Maxcy/Extender: Tito Dine in Treatment: 56 Vascular Assessment Pulses: Dorsalis Pedis Palpable: [Right:Yes] Posterior Tibial Extremity colors, hair growth, and conditions: Extremity Color: [Right:Hyperpigmented] Hair Growth on Extremity: [Right:No] Temperature of Extremity: [Right:Warm] Capillary Refill: [Right:< 3 seconds] Electronic Signature(s) Signed: 10/29/2017 4:10:38 PM By: Roger Shelter Entered By: Roger Shelter on 10/29/2017 10:13:18 Boan, Sherrey V. ([id number]) -------------------------------------------------------------------------------- Multi Wound Chart Details Patient Name: Joost, Ashika V. Date of Service: 10/29/2017 10:00 AM Medical Record Number: [id number] Patient Account Number: [id number] Date of Birth/Sex: Nov 20, 1940  (77 y.o. 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] [7:Lower Extremity] Comorbid  History: [11:Cataracts, Chronic sinus problems/congestion, Congestive Heart Failure, Hypertension, Type II Diabetes Hypertension, Type II Diabetes] [7:Cataracts, Chronic sinus problems/congestion, Congestive Heart Failure,] [N/A:N/A] Date Acquired: [11:10/15/2017] [7:08/06/2013] [N/A:N/A] Weeks of Treatment: [11:2] [7:51] [N/A:N/A] Wound Status: [11:Open] [7:Open] [N/A:N/A] Measurements L x W x D [11:1x1.1x0.1] [7:2.2x0.9x0.3] [N/A:N/A] (cm) Area (cm) : [11:0.864] [7:1.555] [N/A:N/A] Volume (cm) : [11:0.086] [7:0.467] [N/A:N/A] % Reduction in Area: [11:-632.20%] [7:-32.00%] [N/A:N/A] % Reduction in Volume: [11:-616.70%] [7:-32.30%] [N/A:N/A] Classification: [11:Grade 2] [7:Grade 2] [N/A:N/A] Exudate Amount: [11:Large] [7:Large] [N/A:N/A] Exudate Type: [11:Serosanguineous] [7:Serous] [N/A:N/A] Exudate Color: [11:red, brown] [7:amber] [N/A:N/A] Wound Margin: [11:Distinct, outline attached] [7:Flat and Intact] [N/A:N/A] Granulation Amount: [11:Small (1-33%)] [7:Medium (34-66%)] [N/A:N/A] Granulation Quality: [11:Pink] [7:Red] [N/A:N/A] Necrotic Amount: [11:Large (67-100%)] [7:Medium (34-66%)] [N/A:N/A] Exposed Structures: [11:Fascia: No Fat Layer (Subcutaneous Tissue) Exposed: No Tendon: No Muscle: No  Joint: No Bone: No] [7:Fat Layer (Subcutaneous Tissue) Exposed: Yes Fascia: No Tendon: No Muscle: No Joint: No Bone: No] [N/A:N/A] Epithelialization: [11:Small (1-33%)] [7:Small (1-33%)] [N/A:N/A] Periwound Skin Texture: [11:Excoriation: No Induration: No] [7:Induration: Yes Excoriation: No] [N/A:N/A] Callus: No Callus: No Crepitus: No Crepitus: No Rash: No Rash: No Scarring: No Scarring: No Periwound Skin Moisture: Maceration: No Maceration: No N/A Dry/Scaly: No Dry/Scaly: No Periwound Skin Color: Atrophie Blanche: No Hemosiderin Staining: Yes N/A Cyanosis: No Atrophie Blanche: No Ecchymosis: No Cyanosis: No Erythema: No Ecchymosis: No Hemosiderin Staining: No Erythema: No Mottled: No Mottled: No Pallor: No Pallor: No Rubor: No Rubor: No Temperature: No Abnormality No Abnormality N/A Tenderness on Palpation: Yes Yes N/A Wound Preparation: Ulcer Cleansing: Ulcer Cleansing: N/A Rinsed/Irrigated with Saline Rinsed/Irrigated with Saline Topical Anesthetic Applied: Topical Anesthetic Applied: Other: lidocaine 4% Other: lidocaine 4% Treatment Notes Electronic Signature(s) Signed: 10/30/2017 8:15:41 AM By: Linton Ham MD Entered By: Linton Ham on 10/29/2017 11:17:51 Sydnor, Jurnei [name] ([id number]) -------------------------------------------------------------------------------- Multi-Disciplinary Care Plan Details Patient Name: Hanigan, Greysen V. Date of Service: 10/29/2017 10:00 AM Medical Record Number: [id number] Patient Account Number: [id number] Date of Birth/Sex: 1940-10-09 (77 y.o. F) Treating RN: Cornell Barman Primary Care Zaraya Delauder: Beverlyn Roux Other Clinician: Referring Lesa Vandall: Beverlyn Roux Treating Camile Esters/Extender: Tito Dine in Treatment: 3 Active Inactive ` Abuse / Safety / Falls / Self Care Management Nursing Diagnoses: Impaired physical mobility Potential for falls Goals: Patient will remain injury free Date Initiated:  11/06/2016 Target Resolution Date: 12/30/2016 Goal Status: Active Patient/caregiver will verbalize understanding of skin care regimen Date Initiated: 11/06/2016 Target Resolution Date: 12/30/2016 Goal Status: Active Interventions: Assess fall risk on admission and as needed Treatment Activities: Patient referred to home care : 11/06/2016 Notes: ` Nutrition Nursing Diagnoses: Potential for alteratiion in Nutrition/Potential for imbalanced nutrition Goals: Patient/caregiver verbalizes understanding of need to maintain therapeutic glucose control per primary care physician Date Initiated: 11/06/2016 Target Resolution Date: 12/30/2016 Goal Status: Active Interventions: Provide education on elevated blood sugars and impact on wound healing Notes: ` Orientation to the Wound Care Program Nursing Diagnoses: Knowledge deficit related to the wound healing center program JAWANA, REAGOR ([id number]) Goals: Patient/caregiver will verbalize understanding of the [place] Date Initiated: 11/06/2016 Target Resolution Date: 12/30/2016 Goal Status: Active Interventions: Provide education on orientation to the wound center Notes: ` Venous Leg Ulcer Nursing Diagnoses: Actual venous Insuffiency (use after diagnosis is confirmed) Knowledge deficit related to disease process and management Goals: Non-invasive venous studies are completed as ordered Date Initiated: 11/06/2016 Target Resolution Date: 12/30/2016 Goal Status: Active Patient/caregiver will verbalize understanding of disease process and disease management Date Initiated: 11/06/2016 Target Resolution Date: 12/30/2016 Goal Status: Active  Interventions: Assess peripheral edema status every visit. Notes: ` Wound/Skin Impairment Nursing Diagnoses: Impaired tissue integrity Knowledge deficit related to smoking impact on wound healing Knowledge deficit related to ulceration/compromised skin integrity Goals: Ulcer/skin  breakdown will heal within 14 weeks Date Initiated: 11/06/2016 Target Resolution Date: 02/05/2017 Goal Status: Active Interventions: Assess ulceration(s) every visit Treatment Activities: Skin care regimen initiated : 11/06/2016 Notes: Electronic Signature(s) Signed: 10/29/2017 5:06:18 PM By: Gretta Cool, BSN, RN, CWS, Kim RN, BSN Backer, Dorotea V. ([id number]) Entered By: Gretta Cool, BSN, RN, CWS, Kim on 10/29/2017 10:32:52 Gildner, [name] ([id number]) -------------------------------------------------------------------------------- Pain Assessment Details Patient Name: Valdes, [place] Date of Service: 10/29/2017 10:00 AM Medical Record Number: [id number] Patient Account Number: [id number] Date of Birth/Sex: 07-Sep-1940 (77 y.o. F) Treating RN: Roger Shelter Primary Care Atticus Wedin: Beverlyn Roux Other Clinician: Referring Tasia Liz: Beverlyn Roux Treating Tinleigh Whitmire/Extender: Tito Dine in Treatment: 44 Active Problems Location of Pain Severity and Description of Pain Patient Has Paino No Site Locations Pain Management and Medication Current Pain Management: Electronic Signature(s) Signed: 10/29/2017 4:10:38 PM By: Roger Shelter Entered By: Roger Shelter on 10/29/2017 10:05:20 Tuite, Sitara [name] ([id number]) -------------------------------------------------------------------------------- Patient/Caregiver Education Details Patient Name: Hamblin, Genette V. Date of Service: 10/29/2017 10:00 AM Medical Record Number: [id number] Patient Account Number: [id number] Date of Birth/Gender: 1940-09-22 (77 y.o. F) Treating RN: Montey Hora Primary Care Physician: Beverlyn Roux Other Clinician: Referring Physician: Beverlyn Roux Treating Physician/Extender: Tito Dine in Treatment: 62 Education Assessment Education Provided To: Patient and Caregiver Education Topics Provided Venous: Handouts: Other: f/u with PCP r/t edema Methods: Explain/Verbal Responses: State content  correctly Electronic Signature(s) Signed: 10/29/2017 4:02:56 PM By: Montey Hora Entered By: Montey Hora on 10/29/2017 11:36:59 Marhefka, Hailyn V. ([id number]) -------------------------------------------------------------------------------- Wound Assessment Details Patient Name: Birkey, Layli V. Date of Service: 10/29/2017 10:00 AM Medical Record Number: [id number] Patient Account Number: [id number] Date of Birth/Sex: 09-06-1940 (77 y.o. F) Treating RN: Roger Shelter Primary Care Katrin Grabel: Beverlyn Roux Other Clinician: Referring Tomoya Ringwald: Beverlyn Roux Treating Annarose Ouellet/Extender: Tito Dine in Treatment: 57 Wound Status Wound Number: 11 Primary Diabetic Wound/Ulcer of the Lower Extremity Etiology: Wound Location: Right Toe Great Wound Open Wounding Event: Gradually Appeared Status: Date Acquired: 10/15/2017 Comorbid Cataracts, Chronic sinus problems/congestion, Weeks Of Treatment: 2 History: Congestive Heart Failure, Hypertension, Type II Clustered Wound: No Diabetes Photos Photo Uploaded By: Alric Quan on 10/29/2017 15:47:20 Wound Measurements Length: (cm) 1 Width: (cm) 1.1 Depth: (cm) 0.1 Area: (cm) 0.864 Volume: (cm) 0.086 % Reduction in Area: -632.2% % Reduction in Volume: -616.7% Epithelialization: Small (1-33%) Tunneling: No Undermining: No Wound Description Classification: Grade 2 Wound Margin: Distinct, outline attached Exudate Amount: Large Exudate Type: Serosanguineous Exudate Color: red, brown Foul Odor After Cleansing: No Slough/Fibrino Yes Wound Bed Granulation Amount: Small (1-33%) Exposed Structure Granulation Quality: Pink Fascia Exposed: No Necrotic Amount: Large (67-100%) Fat Layer (Subcutaneous Tissue) Exposed: No Necrotic Quality: Adherent Slough Tendon Exposed: No Muscle Exposed: No Joint Exposed: No Bone Exposed: No Periwound Skin Texture Sapien, Kurstin V. ([id number]) Texture Color No Abnormalities Noted: No No  Abnormalities Noted: No Callus: No Atrophie Blanche: No Crepitus: No Cyanosis: No Excoriation: No Ecchymosis: No Induration: No Erythema: No Rash: No Hemosiderin Staining: No Scarring: No Mottled: No Pallor: No Moisture Rubor: No No Abnormalities Noted: No Dry / Scaly: No Temperature / Pain Maceration: No Temperature: No Abnormality Tenderness on Palpation: Yes Wound Preparation Ulcer Cleansing: Rinsed/Irrigated with Saline Topical Anesthetic Applied: Other: lidocaine 4%, Treatment Notes Wound #11 (Right Toe Great) 1. Cleansed with: Clean wound  with Normal Saline 2. Anesthetic Topical Lidocaine 4% cream to wound bed prior to debridement 4. Dressing Applied: Hydrogel Prisma Ag Other dressing (specify in notes) 5. Secondary Dressing Applied ABD Pad Dry Gauze Kerlix/Conform Notes drawtex, heel foam and gauze wrap Electronic Signature(s) Signed: 10/29/2017 4:10:38 PM By: Roger Shelter Entered By: Roger Shelter on 10/29/2017 10:12:23 Loredo, Jeslyn V. ([id number]) -------------------------------------------------------------------------------- Wound Assessment Details Patient Name: Accardi, Kameran V. Date of Service: 10/29/2017 10:00 AM Medical Record Number: [id number] Patient Account Number: [id number] Date of Birth/Sex: Dec 27, 1940 (77 y.o. F) Treating RN: Roger Shelter Primary Care Haynes Giannotti: Beverlyn Roux Other Clinician: Referring Dasani Thurlow: Beverlyn Roux Treating Lataria Courser/Extender: Tito Dine in Treatment: 62 Wound Status Wound Number: 7 Primary Diabetic Wound/Ulcer of the Lower Extremity Etiology: Wound Location: Right Metatarsal head first - Dorsal Wound Open Wounding Event: Gradually Appeared Status: Date Acquired: 08/06/2013 Comorbid Cataracts, Chronic sinus problems/congestion, Weeks Of Treatment: 51 History: Congestive Heart Failure, Hypertension, Type II Clustered Wound: No Diabetes Photos Photo Uploaded By: Alric Quan on  10/29/2017 15:47:21 Wound Measurements Length: (cm) 2.2 Width: (cm) 0.9 Depth: (cm) 0.3 Area: (cm) 1.555 Volume: (cm) 0.467 % Reduction in Area: -32% % Reduction in Volume: -32.3% Epithelialization: Small (1-33%) Tunneling: No Undermining: No Wound Description Classification: Grade 2 Wound Margin: Flat and Intact Exudate Amount: Large Exudate Type: Serous Exudate Color: amber Foul Odor After Cleansing: No Slough/Fibrino Yes Wound Bed Granulation Amount: Medium (34-66%) Exposed Structure Granulation Quality: Red Fascia Exposed: No Necrotic Amount: Medium (34-66%) Fat Layer (Subcutaneous Tissue) Exposed: Yes Necrotic Quality: Adherent Slough Tendon Exposed: No Muscle Exposed: No Joint Exposed: No Bone Exposed: No Periwound Skin Texture Reinig, Jakyah V. ([id number]) Texture Color No Abnormalities Noted: No No Abnormalities Noted: No Callus: No Atrophie Blanche: No Crepitus: No Cyanosis: No Excoriation: No Ecchymosis: No Induration: Yes Erythema: No Rash: No Hemosiderin Staining: Yes Scarring: No Mottled: No Pallor: No Moisture Rubor: No No Abnormalities Noted: No Dry / Scaly: No Temperature / Pain Maceration: No Temperature: No Abnormality Tenderness on Palpation: Yes Wound Preparation Ulcer Cleansing: Rinsed/Irrigated with Saline Topical Anesthetic Applied: Other: lidocaine 4%, Treatment Notes Wound #7 (Right, Dorsal Metatarsal head first) 1. Cleansed with: Clean wound with Normal Saline 2. Anesthetic Topical Lidocaine 4% cream to wound bed prior to debridement 4. Dressing Applied: Hydrogel Prisma Ag Other dressing (specify in notes) 5. Secondary Dressing Applied ABD Pad Dry Gauze Kerlix/Conform Notes drawtex, heel foam and gauze wrap Electronic Signature(s) Signed: 10/29/2017 4:10:38 PM By: Roger Shelter Entered By: Roger Shelter on 10/29/2017 10:12:47 Carlino, Denora V.  ([id number]) -------------------------------------------------------------------------------- Vitals Details Patient Name: Fisk, Marnie V. Date of Service: 10/29/2017 10:00 AM Medical Record Number: [id number] Patient Account Number: [id number] Date of Birth/Sex: Jan 03, 1941 (77 y.o. F) Treating RN: Roger Shelter Primary Care Gianny Killman: Beverlyn Roux Other Clinician: Referring Bianca Raneri: Beverlyn Roux Treating Chanya Chrisley/Extender: Tito Dine in Treatment: 51 Vital Signs Time Taken: 10:05 Temperature (F): 97.6 Height (in): 64 Pulse (bpm): 66 Weight (lbs): 200 Respiratory Rate (breaths/min): 18 Body Mass Index (BMI): 34.3 Blood Pressure (mmHg): 146/59 Reference Range: 80 - 120 mg / dl Electronic Signature(s) Signed: 10/29/2017 4:10:38 PM By: Roger Shelter Entered By: Roger Shelter on 10/29/2017 10:05:40

## 2017-10-31 NOTE — Progress Notes (Signed)
JAMAIRA, SHERK ([id number]) Visit Report for 10/29/2017 HPI Details Patient Name: White, [name] Date of Service: 10/29/2017 10:00 AM Medical Record Number: [id number] Patient Account Number: [id number] Date of Birth/Sex: [birth year] (77 y.o. F) Treating RN: Cornell Barman Primary Care Provider: Beverlyn Roux Other Clinician: Referring Provider: Beverlyn Roux Treating Provider/Extender: Tito Dine in Treatment: 51 History of Present Illness HPI Description: 08/13/16: This is a 77 year old woman who came predominantly for review of 3 cm in diameter circular wound to the left anterior lateral leg. She was in the ER on 08/01/16 I reviewed their notes. There was apparently pus coming out of the wound at that time and the patient arrived requesting debridement which they don't do in the emergency room. Nevertheless I can't see that they did any x-rays. There were no cultures done. She is a type II diabetic and I a note after the patient was in the clinic that she had a bypass graft from the popliteal to the tibial on the right on 02/28/16. She also had a right greater saphenous vein harvest on the same date for arterial bypass. She is going to have vascular studies including ABIs T ABIs on the right on 08/28/16. The patient's surgery was on 02/28/16 by Dr. Vallarie Mare she had a right below the knee popliteal artery to peroneal artery bypass with reverse greater saphenous vein and an endarterectomy of the mid segment peroneal artery. Postoperatively she had a strong mild monophasic peroneal signal with a pink foot. It would appear that the patient is had some nonhealing in the surgical saphenous vein harvest site on the left leg. Surprisingly looking through [facility] I cannot see much information about this at all. Dr. Lucious Groves notes from 05/29/16 show that the patient's wounds "are not healed" the right first metatarsal wound healed but then opened back up. 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 At that point the patient had a wound VAC to her right calf with regards to the wounds on her dorsal right toe would appear that these are felt to be arterial wounds. She has had surgery on the metatarsal phalangeal in 2015 I Dr. Doran Durand secondary to a right metatarsal phalangeal joint fracture. She is apparently had discoloration around this area since then. 08/28/16; patient arrives with her wounds in much the same condition. The linear vein harvest site and the circular wound below it which I think was a blister. She also has to probing holes in her right great toe and a necrotic eschar on the right second toe. Because of these being arterial wounds I reduced her compression from 3-2 layers this seems to of done satisfactorily she has not had any problems. I cannot see that she is actually had an x-ray ====== 11/06/16 the patient comes in for evaluation of her right lower extremity ulcers. She was here in January 2018 for 2 visits subsequently ended up in the hospital with pneumonia and then to rehabilitation. She has now been discharged from rehabilitation and is home. She has multiple ulcerations to her right lower extremity including the foot and toes. She does have home health in place and they have been placing alginate to the ulcers. She is followed by Dr. Bridgett Larsson of vascular medicine. 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 She recently saw him on 3/23. In office ABIs were:  Right 0.48 with monophasic flow to the DP, PT, peroneal Left 0.63 with monophasic flow to the DP and PT Her arterial studies indicated a patent right below knee popliteal to peroneal bypass She had an MRI in February 2008 that was negative frosty myelitis but this showed general soft tissue edema in the right foot and lower extremity concerning  for cellulitis She is a diabetic, managed with insulin. Her hemoglobin A1c in December 2017 was 8.4 which is a trend up from previous levels. She had blood work in February 2018 which revealed an albumin of 2.6 this appears to be relatively acute as an albumin in November 2017 was 3.7 11/13/16; this is a patient I have not seen since February who is readmitted to our clinic last week. 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 Bridgett Larsson. I have reviewed Dr. Lianne Moris notes from March/23/18. Doppler ABI on that date showed an ABI on the right of 0.48 and on the left of 0.63. Dorsalis pedis waveforms were monophasic bilaterally. There was no waveforms detected at the posterior tibial on the right, monophasic on the left. Dr. Geoffry Paradise, [name] ([id number]) Chen's comments were that this patient would have follow-up vascular studies in 3 months including ABIs and right lower extremity arterial duplex. She had an MRI in February that was negative for osteomyelitis but showed generalized edema in the foot. Last albumin I see was in January at 3.4 we have been using Santyl to the 4 wounds on the right leg the patient is noted today to have widespread edema well up towards her groin this is pitting 2-3+. 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 Normal ejection fraction. 11/20/16; patient has a follow-up appointment with Dr. Bridgett Larsson on April 23. She is still complaining of a lot of pain in the right foot and right leg. It is not clear to me that this is at all positional however I think it is clear claudication with minimal activity perhaps at rest. At our suggestion she is return to her primary physician's office tomorrow with regards to her pitting lower extremity bilateral edema that I reviewed in detail last week 11/27/16; the follow-up with Dr. Bridgett Larsson was actually on May 23 on April 23 as I  stated in my note last week. N/A case all of her wounds seems somewhat smaller. The 2 on the right leg are definitely smaller. The areas on the dorsal right first toe, right third toe and the lateral part of the right fifth metatarsal head all looks smaller but have tightly adherent surfaces. We have been using Santyl 12/04/16; follow-up with Dr. Bridgett Larsson on May 23. 2 small open wounds on the right leg continue to get smaller. The area on the right third total lateral aspect of the right fifth toe also look better. The remaining area on the dorsal first toe still has some depth to it. We have been using Santyl to the toes and collagen on the right 12/11/16; according to patient's husband the follow-up with Dr. Bridgett Larsson is not until July. All of the wounds on the right leg are measuring smaller. We have been using some combination of Prisma and Santyl although I think we can go to straight Prisma today. There may have been some confusion with home health about the primary dressing orders here. 12/25/16; the patient has had some healing this week. The area on her right  lateral fifth metatarsal head, right third toe are both healed lower right leg is healed. In the vein harvest site superiorly she has one superficial open area. On the dorsal aspect of her right great toe/MTP joint the wound is now divided into 2 however the proximal area is deep and there is palpable bone 01/01/17; still an open area in the middle of her original right surgical scar. The area on the right third toe and right lateral fifth metatarsal head remained closed. Problematic area on the dorsal aspect of the toe. Previous surgery in this area line 01/08/17 small open area on the original scar on her upper anterior leg although this is closing. X-ray I did of the right first toe did not show underlying bony abnormality. Still this area on the dorsal first toe probes to bone. We have been using Prisma 01/15/17; small open area in the original scar  in the upper anterior leg is almost fully closed. She has 2 open areas over the dorsal aspect of the right first toe that probes to bone. Used and a form starting last week. Vigorous bone scraping that I did last week showed few methicillin sensitive staph aureus. She is allergic to penicillin and sulfa drugs. I'm going to give her 2 weeks of doxycycline. She will need an MRI with contrast. 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 She continues on doxycycline for a bone scraping that showed methicillin sensitive staph aureus [allergic to penicillin and sulfa]. We have been using Endo form to the wound. 01/29/17; surprisingly her MRI did not show osteomyelitis. She continues on doxycycline for a bone scraping that showed methicillin sensitive staph aureus with allergies to penicillin and sulfa. We have been using Endo form to the wound. Unfortunately I cannot get a surface on this visit looks like it is able to support a healing state. Proximally there is still exposed bone. There is no overt soft tissue tenderness. Her MRI did show a previous fracture was surgery to this area but no hardware 02/12/17 on evaluation today patient appears to be doing well in regard to her lower extremity wound. She does have some mild discomfort but this is minimal. There's no evidence of infection. Her left great toe nail has somewhat been lifted up and there is a little bit of slight bleeding underneath but this is still firmly attached. 02/19/17; she ran out of doxycycline 2 days ago. Nevertheless I would like to continue this for 2 weeks to make a full 6 weeks of therapy as the bone scraping that I did from the open area on the dorsal right first toe showed MRSA. This should complete antibiotic therapy. 02/26/17; the patient is completing her 6 weeks of oral doxycycline. Deep wound on the dorsal right toe. This still has some exposed bone  proximally. She does have a reasonably granulated surface albeit thin over bone. I've been using Endo form have made application for an amniotic skin sub 03/05/17; the patient has completed 6 weeks of doxycycline. This is a deep wound on the dorsal right toe which has exposed bone proximally. She has granulation over most of this wound albeit a thin layer. I've been using Endo form. Still do not have approval for [employer] 03/13/17 on evaluation today patient's right foot wound appears to be doing better measurement wise compared to her last evaluation. She has been tolerating the dressing change without complication and  does have a appointment with the dietary that has been made as far as referral is concerned there just waiting for her and transportation to contact them back for an actual date. Nonetheless patient has been having less pain at this point. No fevers, chills, nausea, or vomiting noted at this time. 03/26/17; linear wound over the dorsal aspect of her right great toe. At one point this had exposed bone on the most superior aspect however I am pleased to see today that this appears to have a surface of granulation. Apparently product was applied for but denied by [facility]. We'll need to see what that was. The patient has known PAD status post revascularization. MRI Tuley, Nika V. ([id number]) did not show osteomyelitis which was done in June 04/02/17; continued improvement using Endoform. She was approved for [employer] but hasn't over $557 co-pay per application, this is beyond her means 04/09/17; continues on Endoform. 04/16/17; appears to be doing nicely continuing on Endoform 04/22/17; patient did not have her dressing changed all week because of the weather. Using Endoform. Base of the wound looks healthy elbow there appears to events surrounding maceration perhaps because of the drainage was not changing the wound dressing 04/30/17 wound today continues to close and except for a small divot on  the superior part of the wound. This area did not probe the bone but I found this a little concerning as this was the area with exposed bone before we be able to get this to granulate forward. As I remember things she is not a candidate for skin substitutes secondary to an outlandish co-pay. I asked her husband to look into the out of pocket max for medications if he has 1 [Regranex] or totally for skin substitutes 05/07/17; the patient arrives today with a wound roughly the same size although under careful inspection under the light there appears to be some epithelialization. Her intake nurse noted that the Endoform seemed to be placed over the wound rather than in the deeper divots they could really benefit from the Endoform. At this point I have no plans to change the Endoform as at one point this was at least 33% exposed bone and the rest of the wound very close to the underlying phalanx 05/14/17; no major change in the size or appearance of the wound. We have been using Endoform for a prolonged period of time with reasonably good improvement in the epithelialization i.e. no exposed bone especially proximally. Unless we've not made a lot of changes in the last several weeks. She does not complain of pain. She was revascularized early this year or perhaps late last year by Dr. Bridgett Larsson and I wonder if he will need to have a look at her, I will need to review the actual vascular procedure which I think was a distal popliteal bypass 05/21/17; switched to either for a blue last week. Patient arrived in clinic with the wound not measuring any better but the surface looking some better. Unfortunately she had some surface slough and when I went to remove this superiorly she had exposed bone. Previously she had had exposed bone in the inferior part of the wound however this is granulated over some weeks ago. Clearly this is a major step back for her. With some difficulty I was able to obtain a piece of the  bone probing through the superior part of the wound for culture. We did not send pathology. It is likely that she is going to need imaging of this site but  I did not order this today 05/28/17; bone culture I took last week showed MSSA. She still has exposed bone however I could not get another piece for pathology. She had an MRI 4 months ago that did not show osteoarthritic at time. Wound rapidly deteriorated superiorly and I suspect this is underlying osteomyelitis. We have been using Hydrofera Blue 06/04/17 on evaluation today patient's wound appears to actually be doing a little bit better visually compared to last week's evaluation which is good news. Nonetheless she does have osteomyelitis of the toe which does not appear to be MRSA. No fevers, chills, nausea, or vomiting noted at this time. She is having no discomfort. 06/11/17; patient's wound looks a little healthier than I remember seeing it 2 weeks ago. There is no exposed bone and the surface of the wound appears well granulated. We've been using hydrofera blue. I note that she was started on doxycycline which was MSSA. Her appointment with Dr. Ola Spurr is on November 12 I believe. 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 Vallarie Mare of vascular surgery on 11/9. He offered right leg angiography possible intervention however the patient refused. I've discussed this with the patient's husband today she did not want any further procedures. Also noteworthy that Dr. Vallarie Mare stated that this wound had not healed in 3 years, I was not aware of this degree of chronicity in this area. She has underlying osteomyelitis by bone culture. Culture of this grew MSSA, she is allergic to penicillin and therefore not a candidate for beta lactams. She saw Dr. Ola Spurr of infectious disease this morning, he continued her on doxycycline for another month and apparently sent in a prescription. We have been using Hydrofera Blue. ABI's  update by Dr. Bridgett Larsson right 0.62, left 0.89. Monophasic wave forms 06/25/17 on evaluation today patient appears to be doing well in regard to her right great toe wound. She is still taking the doxycycline as prescribed by Dr. Ola Spurr. The Hydrofera Blue Dressing's to be doing as well as anything has for her up to this point and we are still waiting on an insurance authorization for the [place]. She is having no pain which is good news. No fevers, chills, nausea, or vomiting noted at this time. 07/02/17; still taking doxycycline as prescribed by Dr. Ola Spurr. Hydrofera Blue continues. We have no palpable bone. Still have not had had approval of Apligraf 08/06/16; the patient arrives today with [facility] even though we apparently had changed to Sorbact last time. Her co-pay for Regranex is $389 in discussion with the patient and her husband this was excessive. We'll continue with the Sorbact and standard dressings for now 08/20/17; we have been using sorbact. The wound bed still requires debridement. Wound measurements are slightly better. 09/03/17;using Sorbact.no debridement today. Wound measurements slightly better. There is some discoloration of the tip of her toelooks like bruising 09/17/17; using Sorbact. No debridement today. Wound dimensions are about the same. Still some discoloration at the tip of Holley, Cleota V. ([id number]) her great toe. This does not look any worse than last time 10/01/17; using sorbact right great toe I thought she might have surface epithelialization last time although it is certainly not look like that today nevertheless her dimensions are better. Continued surface eschar on the tip of her toe but this is not progressing. She is actually complaining of the left great toe 10/15/17; this is a very difficult area on the dorsal aspect of her proximal right great toe. At one point this  wound had exposed bone however we have managed to get some degree of  epithelialization. She was revascularized by Dr. Vallarie Mare in [place]. She saw Dr. Ola Spurr for underlying osteomyelitis. She is not a candidate for advanced treatment options [insurance issues]. She comes in today with a new area on the tip of her right great toe. The exact history here is unclear. We have been using sorbact 10/29/17; patient arrives today with very significant bilateral increase edema which appears to extend into the thighs. This is tight and not particularly pitting however it looks a lot more impressive than I remember. Not surprisingly the wounds on her right great toe have increased in size with weeping edema fluid as the edema extends into the dorsal foot itself. She also has a wound on the tip of her right great toe which is a new wound from 2 weeks ago. We have been using sorbact. 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 He is on Demadex presumably twice a day at 20 mg. I have asked her family to make her an appointment with her primary physicians at the [facility] to go over this degree of edema. This is causing I think deterioration in the right great toe wound. She also has significant PAD and is status post right leg bypass graft. I think this was done by [facility] and vascular. I believe they also said that there was nothing further that could be done with regards to her vascular status. Electronic Signature(s) Signed: 10/30/2017 8:15:41 AM By: Linton Ham MD Entered By: Linton Ham on 10/29/2017 11:25:20 Shuffield, Chan [name] ([id number]) -------------------------------------------------------------------------------- Physical Exam Details Patient Name: Cheek, Helana V. Date of Service: 10/29/2017 10:00 AM Medical Record Number: [id number] Patient Account Number: [id number] Date of Birth/Sex: 02/01/1941 (77 y.o. F) Treating RN: Cornell Barman Primary Care Provider: Beverlyn Roux  Other Clinician: Referring Provider: Beverlyn Roux Treating Provider/Extender: Tito Dine in Treatment: 67 Constitutional Patient is hypertensive.. Pulse regular and within target range for patient.[name] Respirations regular, non-labored and within target range.. Temperature is normal and within the target range for the patient.[name] appears in no distress. Eyes Conjunctivae clear. No discharge. Respiratory Respiratory effort is easy and symmetric bilaterally. Rate is normal at rest and on room air.. shallow air entry no wheezing. Cardiovascular heart sounds are soft. JVP is 3-4 cm at 45o. heart sounds are soft no gallop. Pedal pulses absent bilaterally.however this may be edema related. 3+ to 4+ tight peripheral edema bilaterally. I think this actually extends up into her thighs although she was not addressed for me to see precisely. I think this may be a combination of pitting and nonpitting edema. Gastrointestinal (GI) no obvious abdominal distention no shifting dullness. Lymphatic none palpable in the popliteal or inguinal area. Integumentary (Hair, Skin) there is no rash. Psychiatric No evidence of depression, anxiety, or agitation. Calm, cooperative, and communicative. Appropriate interactions and affect.. Notes exam #1 original wound on the dorsal right great toe is certainly wider. The tissue does not look so bad at the wound bed however there appears to be weeping edema fluid. 2 the area on the tip of the right great toe as a necrotic surface I did not debride this today but debridement is going to likely need to be done Electronic Signature(s) Signed: 10/30/2017 8:15:41 AM By: Linton Ham MD Entered By: Linton Ham on 10/29/2017 11:28:10 Leth, Shatina [name] ([id number]) -------------------------------------------------------------------------------- Physician Orders Details Patient Name: Osei, Syvanna  V. Date of Service: 10/29/2017 10:00 AM Medical Record Number:  [id number] Patient Account Number: [id number] Date of Birth/Sex: [birth year] (77 y.o. F) Treating RN: Cornell Barman Primary Care Provider: Beverlyn Roux Other Clinician: Referring Provider: Beverlyn Roux Treating Provider/Extender: Tito Dine in Treatment: 64 Verbal / Phone Orders: No Diagnosis Coding Wound Cleansing Wound #11 Right Toe Great o Clean wound with Normal Saline. o Cleanse wound with mild soap and water Wound #7 Right,Dorsal Metatarsal head first o Clean wound with Normal Saline. o Cleanse wound with mild soap and water Anesthetic (add to Medication List) Wound #11 Right Toe Great o Topical Lidocaine 4% cream applied to wound bed prior to debridement (In Clinic Only). Wound #7 Right,Dorsal Metatarsal head first o Topical Lidocaine 4% cream applied to wound bed prior to debridement (In Clinic Only). Primary Wound Dressing Wound #11 Right Toe Great o Silver Collagen Wound #7 Right,Dorsal Metatarsal head first o Silver Collagen Secondary Dressing Wound #11 Right Toe Great o Conform/Kerlix o Drawtex Wound #7 Right,Dorsal Metatarsal head first o ABD pad o Conform/Kerlix Dressing Change Frequency Wound #11 Right Toe Great o Change Dressing Monday, Wednesday, Friday Wound #7 Right,Dorsal Metatarsal head first o Change Dressing Monday, Wednesday, Friday Follow-up Appointments Wound #11 Right Toe Great o Return Appointment in 2 weeks. Saulnier, Emonnie V. ([id number]) Wound #7 Right,Dorsal Metatarsal head first o Return Appointment in 2 weeks. Edema Control Wound #11 Right Toe Great o Elevate legs to the level of the heart and pump ankles as often as possible Wound #7 Right,Dorsal Metatarsal head first o Elevate legs to the level of the heart and pump ankles as often as possible Additional Orders / Instructions Wound #11 Right Toe Great o Increase protein intake. o Activity as tolerated Wound #7 Right,Dorsal Metatarsal  head first o Increase protein intake. o Activity as tolerated Home Health Wound #11 Right [place] [facility] weekly, [facility]-Wednesday o Home Health Nurse may visit PRN to address patientos wound care needs. o FACE TO FACE ENCOUNTER: MEDICARE and MEDICAID PATIENTS: I certify that this patient is under my care and that I had a face-to-face encounter that meets the physician face-to-face encounter requirements with this patient on this date. The encounter with the patient was in whole or in part for the following MEDICAL CONDITION: (primary reason for [place] [place]) MEDICAL NECESSITY: I certify, that based on my findings, NURSING services are a medically necessary home health service. HOME BOUND STATUS: I certify that my clinical findings support that this patient is homebound (i.e., Due to illness or injury, pt requires aid of supportive devices such as crutches, cane, wheelchairs, walkers, the use of special transportation or the assistance of another person to leave their place of residence. There is a normal inability to leave the home and doing so requires considerable and taxing effort. Other absences are for medical reasons / religious services and are infrequent or of short duration when for other reasons). o If current dressing causes regression in wound condition, may D/C ordered dressing product/s and apply Normal Saline Moist Dressing daily until next [place] / Other MD appointment. [place] of regression in wound condition at [phone]. o Please direct any NON-WOUND related issues/requests for orders to patient's Primary Care Physician Wound #7 Right,Dorsal Metatarsal head first o [place] [facility]its - [facility] twice weekly, [facility]-Wednesday o Home Health Nurse may visit PRN to address patientos wound care needs. o FACE TO  FACE ENCOUNTER: MEDICARE and  MEDICAID PATIENTS: I certify that this patient is under my care and that I had a face-to-face encounter that meets the physician face-to-face encounter requirements with this patient on this date. The encounter with the patient was in whole or in part for the following MEDICAL CONDITION: (primary reason for [place]) MEDICAL NECESSITY: I certify, that based on my findings, NURSING services are a medically necessary home health service. HOME BOUND STATUS: I certify that my clinical findings support that this patient is homebound (i.e., Due to illness or injury, pt requires aid of supportive devices such as crutches, cane, wheelchairs, walkers, the use of special transportation or the assistance of another person to leave their place of residence. There is a normal inability to leave the home and doing so requires considerable and taxing effort. Other absences are for medical reasons / religious services and are infrequent or of short duration when for other reasons). o If current dressing causes regression in wound condition, may D/C ordered dressing product/s and apply Normal Saline Moist Dressing daily until next [place] / Other MD appointment. [place] of regression in wound condition at [phone]. o Please direct any NON-WOUND related issues/requests for orders to patient's Primary Care Physician GENIENE, LIST ([id number]) Medications-please add to medication list. Wound #11 Right Toe Great o P.O. Antibiotics - continue antibiotics as prescribed Wound #7 Right,Dorsal Metatarsal head first o P.O. Antibiotics - continue antibiotics as prescribed Consults o Cardiology - Follow up with PCP or Cardiologist for excess fluid Electronic Signature(s) Signed: 10/29/2017 5:06:18 PM By: Gretta Cool, BSN, RN, CWS, Kim RN, BSN Signed: 10/30/2017 8:15:41 AM By: Linton Ham MD Entered By: Gretta Cool, BSN, RN, CWS, Kim on 10/29/2017 10:44:36 Arganbright, [name]  ([id number]) -------------------------------------------------------------------------------- Problem List Details Patient Name: Feinstein, Ollie V. Date of Service: 10/29/2017 10:00 AM Medical Record Number: [id number] Patient Account Number: [id number] Date of Birth/Sex: 04-Sep-1940 (77 y.o. F) Treating RN: Cornell Barman Primary Care Provider: Beverlyn Roux Other Clinician: Referring Provider: Beverlyn Roux Treating Provider/Extender: Tito Dine in Treatment: 67 Active Problems ICD-10 Impacting Encounter Code Description Active Date Wound Healing Diagnosis E11.622 Type 2 diabetes mellitus with other skin ulcer 11/06/2016 Yes E11.621 Type 2 diabetes mellitus with foot ulcer 11/06/2016 Yes L97.519 Non-pressure chronic ulcer of other part of right foot with 11/06/2016 Yes unspecified severity L97.219 Non-pressure chronic ulcer of right calf with unspecified 11/06/2016 Yes severity E11.52 Type 2 diabetes mellitus with diabetic peripheral angiopathy 11/06/2016 Yes with gangrene 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 ([id number]) -------------------------------------------------------------------------------- Progress Note Details Patient Name: Accardo, Kiyana V. Date of Service: 10/29/2017 10:00 AM Medical Record Number: [id number] Patient Account Number: [id number] Date of Birth/Sex: August 10, 1940 (77 y.o. F) Treating RN: Cornell Barman Primary Care Provider: Beverlyn Roux Other Clinician: Referring Provider: Beverlyn Roux Treating Provider/Extender: Tito Dine in Treatment: 51 Subjective History of Present Illness (HPI) 08/13/16: This is a 77 year old woman who came predominantly for review of 3 cm in diameter circular wound to the left anterior lateral leg. She was in the ER on 08/01/16 I reviewed their notes. There was apparently pus  coming out of the wound at that time and the patient arrived requesting debridement which they don't do in the emergency room. Nevertheless I can't see that they did any x-rays. There were no cultures done. She is a type II diabetic and I a note after the patient was in the clinic that she had a bypass graft from the popliteal to the tibial on the right on 02/28/16. She also had a right greater saphenous vein harvest on the same date for arterial bypass. She is going to have vascular studies including ABIs T ABIs on the right on 08/28/16. The patient's surgery was on 02/28/16 by Dr. Vallarie Mare she had a right below the knee popliteal artery to peroneal artery bypass with reverse greater saphenous vein and an endarterectomy of the mid segment peroneal artery. Postoperatively she had a strong mild monophasic peroneal signal with a pink foot. It would appear that the patient is had some nonhealing in the surgical saphenous vein harvest site on the left leg. Surprisingly looking [facility] I cannot see much information about this at all. Dr. Lucious Groves notes from 05/29/16 show that the patient's wounds "are not healed" the right first metatarsal wound healed but then opened back up. 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 At that point the patient had a wound VAC to her right calf with regards to the wounds on her dorsal right toe would appear that these are felt to be arterial wounds. She has had surgery on the metatarsal phalangeal in 2015 I Dr. Doran Durand secondary to a right metatarsal phalangeal joint  fracture. She is apparently had discoloration around this area since then. 08/28/16; patient arrives with her wounds in much the same condition. The linear vein harvest site and the circular wound below it which I think was a blister. She also has to probing holes in her right great toe and a necrotic eschar on the right second toe. Because of these being arterial wounds I reduced her compression from 3-2 layers this seems to of done satisfactorily she has not had any problems. I cannot see that she is actually had an x-ray ====== 11/06/16 the patient comes in for evaluation of her right lower extremity ulcers. She was here in January 2018 for 2 visits subsequently ended up in the hospital with pneumonia and then to rehabilitation. She has now been discharged from rehabilitation and is home. She has multiple ulcerations to her right lower extremity including the foot and toes. She does have home health in place and they have been placing alginate to the ulcers. She is followed by Dr. Bridgett Larsson of vascular medicine. 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 She recently saw him on 3/23. In office ABIs were: Right 0.48 with monophasic flow to the DP, PT, peroneal Left 0.63 with monophasic flow to the DP and PT Her arterial studies indicated a patent right below knee popliteal to peroneal bypass She had an MRI in February 2008 that was negative frosty myelitis but this showed general soft tissue edema in the right foot and lower extremity concerning for cellulitis She is a diabetic, managed with insulin. Her hemoglobin A1c in December 2017 was 8.4 which is a trend up from previous levels. She had blood work in February 2018 which revealed an albumin of 2.6 this appears to be relatively acute as  an albumin in November 2017 was 3.7 11/13/16; this is a patient I have not seen since February who is readmitted to our clinic last week. She is  a type II diabetic on insulin with known severe PAD status post revascularization in the left leg by Dr. Bridgett Larsson. I have reviewed Dr. Lianne Moris notes from March/23/18. Doppler ABI on that date showed an ABI on the right of 0.48 and on the left of 0.63. Dorsalis pedis waveforms were monophasic bilaterally. There was no waveforms detected at the posterior tibial on the right, monophasic on the left. Dr. Lianne Moris comments were that this patient would have follow-up vascular studies in 3 months including ABIs and right lower extremity arterial duplex. She had an MRI in February that was negative for osteomyelitis but showed generalized edema in Trusty, Nissa V. ([id number]) the foot. Last albumin I see was in January at 3.4 we have been using Santyl to the 4 wounds on the right leg the patient is noted today to have widespread edema well up towards her groin this is pitting 2-3+. 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 Normal ejection fraction. 11/20/16; patient has a follow-up appointment with Dr. Bridgett Larsson on April 23. She is still complaining of a lot of pain in the right foot and right leg. It is not clear to me that this is at all positional however I think it is clear claudication with minimal activity perhaps at rest. At our suggestion she is return to her primary physician's office tomorrow with regards to her pitting lower extremity bilateral edema that I reviewed in detail last week 11/27/16; the follow-up with Dr. Bridgett Larsson was actually on May 23 on April 23 as I stated in my note last week. N/A case all of her wounds seems somewhat smaller. The 2 on the right leg are definitely smaller. The areas on the dorsal right first toe, right third toe and the lateral part of the right fifth metatarsal head all looks smaller but have tightly adherent surfaces. We have been using Santyl 12/04/16; follow-up with Dr. Bridgett Larsson on May 23. 2 small open wounds on the right  leg continue to get smaller. The area on the right third total lateral aspect of the right fifth toe also look better. The remaining area on the dorsal first toe still has some depth to it. We have been using Santyl to the toes and collagen on the right 12/11/16; according to patient's husband the follow-up with Dr. Bridgett Larsson is not until July. All of the wounds on the right leg are measuring smaller. We have been using some combination of Prisma and Santyl although I think we can go to straight Prisma today. There may have been some confusion with home health about the primary dressing orders here. 12/25/16; the patient has had some healing this week. The area on her right lateral fifth metatarsal head, right third toe are both healed lower right leg is healed. In the vein harvest site superiorly she has one superficial open area. On the dorsal aspect of her right great toe/MTP joint the wound is now divided into 2 however the proximal area is deep and there is palpable bone 01/01/17; still an open area in the middle of her original right surgical scar. The area on the right third toe and right lateral fifth metatarsal head remained closed. Problematic area on the dorsal aspect of the toe. Previous surgery in this area line 01/08/17 small open area on the  original scar on her upper anterior leg although this is closing. X-ray I did of the right first toe did not show underlying bony abnormality. Still this area on the dorsal first toe probes to bone. We have been using Prisma 01/15/17; small open area in the original scar in the upper anterior leg is almost fully closed. She has 2 open areas over the dorsal aspect of the right first toe that probes to bone. Used and a form starting last week. Vigorous bone scraping that I did last week showed few methicillin sensitive staph aureus. She is allergic to penicillin and sulfa drugs. I'm going to give her 2 weeks of doxycycline. She will need an MRI with contrast.  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 She continues on doxycycline for a bone scraping that showed methicillin sensitive staph aureus [allergic to penicillin and sulfa]. We have been using Endo form to the wound. 01/29/17; surprisingly her MRI did not show osteomyelitis. She continues on doxycycline for a bone scraping that showed methicillin sensitive staph aureus with allergies to penicillin and sulfa. We have been using Endo form to the wound. Unfortunately I cannot get a surface on this visit looks like it is able to support a healing state. Proximally there is still exposed bone. There is no overt soft tissue tenderness. Her MRI did show a previous fracture was surgery to this area but no hardware 02/12/17 on evaluation today patient appears to be doing well in regard to her lower extremity wound. She does have some mild discomfort but this is minimal. There's no evidence of infection. Her left great toe nail has somewhat been lifted up and there is a little bit of slight bleeding underneath but this is still firmly attached. 02/19/17; she ran out of doxycycline 2 days ago. Nevertheless I would like to continue this for 2 weeks to make a full 6 weeks of therapy as the bone scraping that I did from the open area on the dorsal right first toe showed MRSA. This should complete antibiotic therapy. 02/26/17; the patient is completing her 6 weeks of oral doxycycline. Deep wound on the dorsal right toe. This still has some exposed bone proximally. She does have a reasonably granulated surface albeit thin over bone. I've been using Endo form have made application for an amniotic skin sub 03/05/17; the patient has completed 6 weeks of doxycycline. This is a deep wound on the dorsal right toe which has exposed bone proximally. She has granulation over most of this wound albeit a thin layer. I've been using Endo form. Still do  not have approval for Affinity 03/13/17 on evaluation today patient's right foot wound appears to be doing better measurement wise compared to her last evaluation. She has been tolerating the dressing change without complication and does have a appointment with the dietary that has been made as far as referral is concerned there just waiting for her and transportation to contact them back for an actual date. Nonetheless patient has been having less pain at this point. No fevers, chills, nausea, or vomiting noted at this time. 03/26/17; linear wound over the dorsal aspect of her right great toe. At one point this had exposed bone on the most superior aspect however I am pleased to see today that this appears to have a surface of granulation. Apparently product was applied for but denied by [facility]. We'll need to see  what that was. The patient has known PAD status post revascularization. MRI did not show osteomyelitis which was done in June 04/02/17; continued improvement using Endoform. She was approved for [facility] but hasn't over $161 co-pay per application, Moler, Madellyn V. ([id number]) this is beyond her means 04/09/17; continues on Endoform. 04/16/17; appears to be doing nicely continuing on Endoform 04/22/17; patient did not have her dressing changed all week because of the weather. Using Endoform. Base of the wound looks healthy elbow there appears to events surrounding maceration perhaps because of the drainage was not changing the wound dressing 04/30/17 wound today continues to close and except for a small divot on the superior part of the wound. This area did not probe the bone but I found this a little concerning as this was the area with exposed bone before we be able to get this to granulate forward. As I remember things she is not a candidate for skin substitutes secondary to an outlandish co-pay. I asked her husband to look into the out of pocket max for medications if he has 1 [Regranex] or  totally for skin substitutes 05/07/17; the patient arrives today with a wound roughly the same size although under careful inspection under the light there appears to be some epithelialization. Her intake nurse noted that the Endoform seemed to be placed over the wound rather than in the deeper divots they could really benefit from the Endoform. At this point I have no plans to change the Endoform as at one point this was at least 33% exposed bone and the rest of the wound very close to the underlying phalanx 05/14/17; no major change in the size or appearance of the wound. We have been using Endoform for a prolonged period of time with reasonably good improvement in the epithelialization i.e. no exposed bone especially proximally. Unless we've not made a lot of changes in the last several weeks. She does not complain of pain. She was revascularized early this year or perhaps late last year by Dr. Bridgett Larsson and I wonder if he will need to have a look at her, I will need to review the actual vascular procedure which I think was a distal popliteal bypass 05/21/17; switched to either for a blue last week. Patient arrived in clinic with the wound not measuring any better but the surface looking some better. Unfortunately she had some surface slough and when I went to remove this superiorly she had exposed bone. Previously she had had exposed bone in the inferior part of the wound however this is granulated over some weeks ago. Clearly this is a major step back for her. With some difficulty I was able to obtain a piece of the bone probing through the superior part of the wound for culture. We did not send pathology. It is likely that she is going to need imaging of this site but I did not order this today 05/28/17; bone culture I took last week showed MSSA. She still has exposed bone however I could not get another piece for pathology. She had an MRI 4 months ago that did not show osteoarthritic at time. Wound  rapidly deteriorated superiorly and I suspect this is underlying osteomyelitis. We have been using Hydrofera Blue 06/04/17 on evaluation today patient's wound appears to actually be doing a little bit better visually compared to last week's evaluation which is good news. Nonetheless she does have osteomyelitis of the toe which does not appear to be MRSA. No fevers, chills, nausea, or  vomiting noted at this time. She is having no discomfort. 06/11/17; patient's wound looks a little healthier than I remember seeing it 2 weeks ago. There is no exposed bone and the surface of the wound appears well granulated. We've been using hydrofera blue. I note that she was started on doxycycline which was MSSA. Her appointment with Dr. Ola Spurr is on November 12 I believe. 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 Vallarie Mare of vascular surgery on 11/9. He offered right leg angiography possible intervention however the patient refused. I've discussed this with the patient's husband today she did not want any further procedures. Also noteworthy that Dr. Vallarie Mare stated that this wound had not healed in 3 years, I was not aware of this degree of chronicity in this area. She has underlying osteomyelitis by bone culture. Culture of this grew MSSA, she is allergic to penicillin and therefore not a candidate for beta lactams. She saw Dr. Ola Spurr of infectious disease this morning, he continued her on doxycycline for another month and apparently sent in a prescription. We have been using Hydrofera Blue. ABI's update by Dr. Bridgett Larsson right 0.62, left 0.89. Monophasic wave forms 06/25/17 on evaluation today patient appears to be doing well in regard to her right great toe wound. She is still taking the doxycycline as prescribed by Dr. Ola Spurr. The Hydrofera Blue Dressing's to be doing as well as anything has for her up to this point and we are still waiting on an insurance authorization for the  [place]. She is having no pain which is good news. No fevers, chills, nausea, or vomiting noted at this time. 07/02/17; still taking doxycycline as prescribed by Dr. Ola Spurr. Hydrofera Blue continues. We have no palpable bone. Still have not had had approval of Apligraf 08/06/16; the patient arrives today with [facility] even though we apparently had changed to Sorbact last time. Her co-pay for Regranex is $389 in discussion with the patient and her husband this was excessive. We'll continue with the Sorbact and standard dressings for now 08/20/17; we have been using sorbact. The wound bed still requires debridement. Wound measurements are slightly better. 09/03/17;using Sorbact.no debridement today. Wound measurements slightly better. There is some discoloration of the tip of her toelooks like bruising 09/17/17; using Sorbact. No debridement today. Wound dimensions are about the same. Still some discoloration at the tip of her great toe. This does not look any worse than last time 10/01/17; using sorbact right great toe I thought she might have surface epithelialization last time although it is certainly not Ruffner, Sammye V. ([id number]) look like that today nevertheless her dimensions are better. Continued surface eschar on the tip of her toe but this is not progressing. She is actually complaining of the left great toe 10/15/17; this is a very difficult area on the dorsal aspect of her proximal right great toe. At one point this wound had exposed bone however we have managed to get some degree of epithelialization. She was revascularized by Dr. Vallarie Mare in [place]. She saw Dr. Ola Spurr for underlying osteomyelitis. She is not a candidate for advanced treatment options [insurance issues]. She comes in today with a new area on the tip of her right great toe. The exact history here is unclear. We have been using sorbact 10/29/17; patient arrives today with very significant bilateral increase  edema which appears to extend into the thighs. This is tight and not particularly pitting however it looks a lot more impressive than I remember. Not surprisingly the  wounds on her right great toe have increased in size with weeping edema fluid as the edema extends into the dorsal foot itself. She also has a wound on the tip of her right great toe which is a new wound from 2 weeks ago. We have been using sorbact. 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 He is on Demadex presumably twice a day at 20 mg. I have asked her family to make her an appointment with her primary physicians at the [facility] to go over this degree of edema. This is causing I think deterioration in the right great toe wound. She also has significant PAD and is status post right leg bypass graft. I think this was done by [facility] and vascular. I believe they also said that there was nothing further that could be done with regards to her vascular status. Objective Constitutional Patient is hypertensive.. Pulse regular and within target range for patient.[name] Respirations regular, non-labored and within target range.. Temperature is normal and within the target range for the patient.[name] appears in no distress. Vitals Time Taken: 10:05 AM, Height: 64 in, Weight: 200 lbs, BMI: 34.3, Temperature: 97.6 F, Pulse: 66 bpm, Respiratory Rate: 18 breaths/min, Blood Pressure: 146/59 mmHg. Eyes Conjunctivae clear. No discharge. Respiratory Respiratory effort is easy and symmetric bilaterally. Rate is normal at rest and on room air.. shallow air entry no wheezing. Cardiovascular heart sounds are soft. JVP is 3-4 cm at 45. heart sounds are soft no gallop. Pedal pulses absent bilaterally.however this may be edema related. 3+ to 4+ tight peripheral edema bilaterally. I think this actually extends up into her thighs although she was not addressed  for me to see precisely. I think this may be a combination of pitting and nonpitting edema. Gastrointestinal (GI) no obvious abdominal distention no shifting dullness. Lymphatic none palpable in the popliteal or inguinal area. Psychiatric No evidence of depression, anxiety, or agitation. Calm, cooperative, and communicative. Appropriate interactions and affect.Geoffry Paradise, Aundra VMarland Kitchen ([id number]) General Notes: exam #1 original wound on the dorsal right great toe is certainly wider. The tissue does not look so bad at the wound bed however there appears to be weeping edema fluid. 2 the area on the tip of the right great toe as a necrotic surface I did not debride this today but debridement is going to likely need to be done Integumentary (Hair, Skin) there is no rash. Wound #11 status is Open. Original cause of wound was Gradually Appeared. The wound is located on the Right Toe Great. The wound measures 1cm length x 1.1cm width x 0.1cm depth; 0.864cm^2 area and 0.086cm^3 volume. There is no tunneling or undermining noted. There is a large amount of serosanguineous drainage noted. The wound margin is distinct with the outline attached to the wound base. There is small (1-33%) pink granulation within the wound bed. There is a large (67-100%) amount of necrotic tissue within the wound bed including Adherent Slough. The periwound skin appearance did not exhibit: Callus, Crepitus, Excoriation, Induration, Rash, Scarring, Dry/Scaly, Maceration, Atrophie Blanche, Cyanosis, Ecchymosis, Hemosiderin Staining, Mottled, Pallor, Rubor, Erythema. Periwound temperature was noted as No Abnormality. The periwound has tenderness on palpation. Wound #7 status is Open. Original cause of wound was Gradually Appeared. The wound is located on the Right,Dorsal Metatarsal head first. The wound measures 2.2cm length x 0.9cm width x 0.3cm depth; 1.555cm^2 area and 0.467cm^3 volume. There is Fat Layer (Subcutaneous Tissue)  Exposed  exposed. There is no tunneling or undermining noted. There is a large amount of serous drainage noted. The wound margin is flat and intact. There is medium (34-66%) red granulation within the wound bed. There is a medium (34-66%) amount of necrotic tissue within the wound bed including Adherent Slough. The periwound skin appearance exhibited: Induration, Hemosiderin Staining. The periwound skin appearance did not exhibit: Callus, Crepitus, Excoriation, Rash, Scarring, Dry/Scaly, Maceration, Atrophie Blanche, Cyanosis, Ecchymosis, Mottled, Pallor, Rubor, Erythema. Periwound temperature was noted as No Abnormality. The periwound has tenderness on palpation. 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 Cleanse wound with mild soap and water Wound #7 Right,Dorsal Metatarsal head first: Clean wound with Normal Saline. Cleanse wound with mild soap and water Anesthetic (add to Medication List): Wound #11 Right Toe Great: Pryce, Jesenia V. ([id number]) Topical Lidocaine 4% cream applied to wound bed prior to debridement (In Clinic Only). Wound #7 Right,Dorsal Metatarsal head first: Topical Lidocaine 4% cream applied to wound bed prior to debridement (In Clinic Only). Primary Wound Dressing: Wound #11 Right Toe Great: Silver  Collagen Wound #7 Right,Dorsal Metatarsal head first: Silver Collagen Secondary Dressing: Wound #11 Right Toe Great: Conform/Kerlix Drawtex Wound #7 Right,Dorsal Metatarsal head first: ABD pad Conform/Kerlix Dressing Change Frequency: Wound #11 Right Toe Great: Change Dressing Monday, Wednesday, Friday Wound #7 Right,Dorsal Metatarsal head first: Change Dressing Monday, Wednesday, Friday Follow-up Appointments: Wound #11 Right Toe Great: Return Appointment in 2 weeks. Wound #7 Right,Dorsal Metatarsal head first: Return Appointment in 2 weeks. Edema Control: Wound #11 Right Toe Great: Elevate legs to the level of the heart and pump ankles as often as possible Wound #7 Right,Dorsal Metatarsal head first: Elevate legs to the level of the heart and pump ankles as often as possible Additional Orders / Instructions: Wound #11 Right Toe Great: Increase protein intake. Activity as tolerated Wound #7 Right,Dorsal Metatarsal head first: Increase protein intake. Activity as tolerated Home Health: Wound #11 Right Toe Great: Continue Home Health Visits - [facility] weekly[facility] Nurse may visit [facility] to address patient s wound care needs. FACE TO FACE ENCOUNTER: MEDICARE and MEDICAID PATIENTS: I certify that this patient is under my care and that I had a face-to-face encounter that meets the physician face-to-face encounter requirements with this patient on this date. The encounter with the patient was in whole or in part for the following MEDICAL CONDITION: (primary reason for [place]) MEDICAL NECESSITY: I certify, that based on my findings, NURSING services are a medically necessary home health service. HOME BOUND STATUS: I certify that my clinical findings support that this patient is homebound (i.e., Due to illness or injury, pt requires aid of supportive devices such as crutches, cane, wheelchairs, walkers, the use of  special transportation or the assistance of another person to leave their place of residence. There is a normal inability to leave the home and doing  so requires considerable and taxing effort. Other absences are for medical reasons / religious services and are infrequent or of short duration when for other reasons). If current dressing causes regression in wound condition, may D/C ordered dressing product/s and apply Normal Saline Moist Dressing daily until next [place] / Other MD appointment. [place] of regression in wound condition at [phone]. Please direct any NON-WOUND related issues/requests for orders to patient's Primary Care Physician Wound #7 Right,Dorsal Metatarsal head first: [place] Visits - [facility] twice weekly, [facility] Nurse may visit [facility] to address patient s wound care needs. FACE TO FACE ENCOUNTER: MEDICARE and MEDICAID PATIENTS: I certify that this patient is under my care and that I had a face-to-face encounter that meets the physician face-to-face encounter requirements with this patient on this date. The encounter with the patient was in whole or in part for the following MEDICAL CONDITION: (primary reason for Home Kareem, Allysha V. ([id number]) Healthcare) MEDICAL NECESSITY: I certify, that based on my findings, NURSING services are a medically necessary home health service. HOME BOUND STATUS: I certify that my clinical findings support that this patient is homebound (i.e., Due to illness or injury, pt requires aid of supportive devices such as crutches, cane, wheelchairs, walkers, the use of special transportation or the assistance of another person to leave their place of residence. There is a normal inability to leave the home and doing so requires considerable and taxing effort. Other absences are for medical reasons / religious services and are infrequent or of short duration when for  other reasons). If current dressing causes regression in wound condition, may D/C ordered dressing product/s and apply Normal Saline Moist Dressing daily until next [place] / Other MD appointment. [place] of regression in wound condition at [phone]. Please direct any NON-WOUND related issues/requests for orders to patient's Primary Care Physician Medications-please add to medication list.: Wound #11 Right Toe Great: P.O. Antibiotics - continue antibiotics as prescribed Wound #7 Right,Dorsal Metatarsal head first: P.O. Antibiotics - continue antibiotics as prescribed Consults ordered were: Cardiology - Follow up with PCP or Cardiologist for excess fluid 1 this patient is out of visible increase in her lower extremity edema that I think needs to be explained. She has chronic diastolic heart failure, stage II chronic renal failure with a stable creatinine below 1. I think she should be screened for proteinuria. I also wonder about the possibility of venous obstruction if there isn't more obvious explanation. #2 we've set her to go back to her primary physician #3 we really weren't going anywhere with sorbact although to be fair there was weeping edema fluid coming out of the wound. Went to Silver collagen today. #4 likely to require debridement of the tip of her toe next week Electronic Signature(s) Signed: 10/30/2017 8:15:41 AM By: Linton Ham MD Entered By: Linton Ham on 10/29/2017 11:30:03 Moyano, Cadey V. ([id number]) -------------------------------------------------------------------------------- SuperBill Details Patient Name: [place], Arletta V. Date of Service: 10/29/2017 Medical Record Number: [id number] Patient Account Number: [id number] Date of Birth/Sex: 07-16-1941 (77 y.o. 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 I70.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, right ankle and foot E08.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

## 2017-11-12 ENCOUNTER — Encounter: Payer: Medicare HMO | Attending: Internal Medicine | Admitting: Internal Medicine

## 2017-11-12 DIAGNOSIS — Z880 Allergy status to penicillin: Secondary | ICD-10-CM | POA: Diagnosis not present

## 2017-11-12 DIAGNOSIS — E11622 Type 2 diabetes mellitus with other skin ulcer: Secondary | ICD-10-CM | POA: Insufficient documentation

## 2017-11-12 DIAGNOSIS — L97512 Non-pressure chronic ulcer of other part of right foot with fat layer exposed: Secondary | ICD-10-CM | POA: Insufficient documentation

## 2017-11-12 DIAGNOSIS — Z794 Long term (current) use of insulin: Secondary | ICD-10-CM | POA: Diagnosis not present

## 2017-11-12 DIAGNOSIS — Z882 Allergy status to sulfonamides status: Secondary | ICD-10-CM | POA: Insufficient documentation

## 2017-11-12 DIAGNOSIS — E1152 Type 2 diabetes mellitus with diabetic peripheral angiopathy with gangrene: Secondary | ICD-10-CM | POA: Insufficient documentation

## 2017-11-12 DIAGNOSIS — I5032 Chronic diastolic (congestive) heart failure: Secondary | ICD-10-CM | POA: Diagnosis not present

## 2017-11-12 DIAGNOSIS — Z8673 Personal history of transient ischemic attack (TIA), and cerebral infarction without residual deficits: Secondary | ICD-10-CM | POA: Insufficient documentation

## 2017-11-12 DIAGNOSIS — M86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, right ankle and foot: Secondary | ICD-10-CM | POA: Insufficient documentation

## 2017-11-12 DIAGNOSIS — I110 Hypertensive heart disease with heart failure: Secondary | ICD-10-CM | POA: Insufficient documentation

## 2017-11-12 DIAGNOSIS — I70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 of foot: Secondary | ICD-10-CM | POA: Insufficient documentation

## 2017-11-12 DIAGNOSIS — E11621 Type 2 diabetes mellitus with foot ulcer: Secondary | ICD-10-CM | POA: Insufficient documentation

## 2017-11-12 DIAGNOSIS — Z79899 Other long term (current) drug therapy: Secondary | ICD-10-CM | POA: Diagnosis not present

## 2017-11-12 DIAGNOSIS — Z95820 Peripheral vascular angioplasty status with implants and grafts: Secondary | ICD-10-CM | POA: Insufficient documentation

## 2017-11-12 DIAGNOSIS — E1169 Type 2 diabetes mellitus with other specified complication: Secondary | ICD-10-CM | POA: Diagnosis not present

## 2017-11-12 DIAGNOSIS — I350 Nonrheumatic aortic (valve) stenosis: Secondary | ICD-10-CM | POA: Diagnosis not present

## 2017-11-14 NOTE — Progress Notes (Signed)
HASSIE, MANDT ([id number]) Visit Report for 11/12/2017 Debridement Details Patient Name: Chesmore, Hendrix V. Date of Service: 11/12/2017 10:00 AM Medical Record Number: [id number] Patient Account Number: [id number] Date of Birth/Sex: 07-23-1941 (77 y.o. F) Treating RN: Cornell Barman Primary Care Provider: Beverlyn Roux Other Clinician: Referring Provider: Beverlyn Roux Treating Provider/Extender: Tito Dine in Treatment: 53 Debridement Performed for Wound #7 Right,Dorsal Metatarsal head first Assessment: Performed By: Physician Ricard Dillon, MD Debridement Type: Debridement Severity of Tissue Pre Fat layer exposed Debridement: Pre-procedure Verification/Time Yes - 10:40 Out Taken: Start Time: 10:41 Pain Control: Other : lidocaine 4% Total Area Debrided (L x W): 1.8 (cm) x 0.5 (cm) = 0.9 (cm) Tissue and other material Viable, Non-Viable, Subcutaneous, Skin: Dermis debrided: Level: Skin/Subcutaneous Tissue Debridement Description: Excisional Instrument: Curette Bleeding: Minimum Hemostasis Achieved: Pressure End Time: 10:45 Procedural Pain: 0 Post Procedural Pain: 0 Response to Treatment: Procedure was tolerated well Post Debridement Measurements of Total Wound Length: (cm) 1.8 Width: (cm) 0.5 Depth: (cm) 0.3 Volume: (cm) 0.212 Character of Wound/Ulcer Post Debridement: Requires Further Debridement Severity of Tissue Post Debridement: Fat layer exposed Post Procedure Diagnosis Same as Pre-procedure Electronic Signature(s) Signed: 11/12/2017 1:43:28 PM By: Gretta Cool, BSN, RN, CWS, Kim RN, BSN Signed: 11/12/2017 5:02:04 PM By: Linton Ham MD Entered By: Linton Ham on 11/12/2017 11:16:05 Mcginniss, Delynn V. ([id number]) -------------------------------------------------------------------------------- HPI Details Patient Name: Krizek, Johnnye V. Date of Service: 11/12/2017 10:00 AM Medical Record Number: [id number] Patient Account Number: [id number] Date of  Birth/Sex: 1940-11-14 (77 y.o. F) Treating RN: Cornell Barman Primary Care Provider: Beverlyn Roux Other Clinician: Referring Provider: Beverlyn Roux Treating Provider/Extender: Tito Dine in Treatment: 50 History of Present Illness HPI Description: 08/13/16: This is a 77 year old woman who came predominantly for review of 3 cm in diameter circular wound to the left anterior lateral leg. She was in the ER on 08/01/16 I reviewed their notes. There was apparently pus coming out of the wound at that time and the patient arrived requesting debridement which they don't do in the emergency room. Nevertheless I can't see that they did any x-rays. There were no cultures done. She is a type II diabetic and I a note after the patient was in the clinic that she had a bypass graft from the popliteal to the tibial on the right on 02/28/16. She also had a right greater saphenous vein harvest on the same date for arterial bypass. She is going to have vascular studies including ABIs T ABIs on the right on 08/28/16. The patient's surgery was on 02/28/16 by Dr. Vallarie Mare she had a right below the knee popliteal artery to peroneal artery bypass with reverse greater saphenous vein and an endarterectomy of the mid segment peroneal artery. Postoperatively she had a strong mild monophasic peroneal signal with a pink foot. It would appear that the patient is had some nonhealing in the surgical saphenous vein harvest site on the left leg. Surprisingly looking through [facility] I cannot see much information about this at all. Dr. Lucious Groves notes from 05/29/16 show that the patient's wounds "are not healed" the right first metatarsal wound healed but then opened back up. 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 At that point the patient had a wound VAC to her right calf with regards to the wounds on her dorsal right toe  would appear that these are felt to be arterial wounds. She has had surgery  on the metatarsal phalangeal in 2015 I Dr. Doran Durand secondary to a right metatarsal phalangeal joint fracture. She is apparently had discoloration around this area since then. 08/28/16; patient arrives with her wounds in much the same condition. The linear vein harvest site and the circular wound below it which I think was a blister. She also has to probing holes in her right great toe and a necrotic eschar on the right second toe. Because of these being arterial wounds I reduced her compression from 3-2 layers this seems to of done satisfactorily she has not had any problems. I cannot see that she is actually had an x-ray ====== 11/06/16 the patient comes in for evaluation of her right lower extremity ulcers. She was here in January 2018 for 2 visits subsequently ended up in the hospital with pneumonia and then to rehabilitation. She has now been discharged from rehabilitation and is home. She has multiple ulcerations to her right lower extremity including the foot and toes. She does have home health in place and they have been placing alginate to the ulcers. She is followed by Dr. Bridgett Larsson of vascular medicine. 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 She recently saw him on 3/23. In office ABIs were: Right 0.48 with monophasic flow to the DP, PT, peroneal Left 0.63 with monophasic flow to the DP and PT Her arterial studies indicated a patent right below knee popliteal to peroneal bypass She had an MRI in February 2008 that was negative frosty myelitis but this showed general soft tissue edema in the right foot and lower extremity concerning for cellulitis She is a diabetic, managed with insulin. Her hemoglobin A1c in December 2017 was 8.4 which is a trend up from previous levels. She had blood work in February 2018 which revealed an albumin of 2.6 this  appears to be relatively acute as an albumin in November 2017 was 3.7 11/13/16; this is a patient I have not seen since February who is readmitted to our clinic last week. 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 Bridgett Larsson. I have reviewed Dr. Lianne Moris notes from March/23/18. Doppler ABI on that date showed an ABI on the right of 0.48 and on the left of 0.63. Dorsalis pedis waveforms were monophasic bilaterally. There was no waveforms detected at the posterior tibial on the right, monophasic on the left. Dr. Lianne Moris comments were that this patient would have follow-up vascular studies in 3 months including ABIs and right lower extremity arterial duplex. She had an MRI in February that was negative for osteomyelitis but showed generalized edema in the foot. Last albumin I see was in January at 3.4 we have been using Santyl to the 4 wounds on the right leg Stucki, Anae V. ([id number]) the patient is noted today to have widespread edema well up towards her groin this is pitting 2-3+. 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 Normal ejection fraction. 11/20/16; patient has a follow-up appointment with Dr. Bridgett Larsson on April 23. She is still complaining of a lot of pain in the right foot and right leg. It is not clear to me that this is at all positional however I think it is clear claudication with minimal activity perhaps at rest. At our suggestion she is return to her primary physician's office tomorrow with regards to her  pitting lower extremity bilateral edema that I reviewed in detail last week 11/27/16; the follow-up with Dr. Bridgett Larsson was actually on May 23 on April 23 as I stated in my note last week. N/A case all of her wounds seems somewhat smaller. The 2 on the right leg are definitely smaller. The areas on the dorsal right first toe, right third toe and the lateral part of the right  fifth metatarsal head all looks smaller but have tightly adherent surfaces. We have been using Santyl 12/04/16; follow-up with Dr. Bridgett Larsson on May 23. 2 small open wounds on the right leg continue to get smaller. The area on the right third total lateral aspect of the right fifth toe also look better. The remaining area on the dorsal first toe still has some depth to it. We have been using Santyl to the toes and collagen on the right 12/11/16; according to patient's husband the follow-up with Dr. Bridgett Larsson is not until July. All of the wounds on the right leg are measuring smaller. We have been using some combination of Prisma and Santyl although I think we can go to straight Prisma today. There may have been some confusion with home health about the primary dressing orders here. 12/25/16; the patient has had some healing this week. The area on her right lateral fifth metatarsal head, right third toe are both healed lower right leg is healed. In the vein harvest site superiorly she has one superficial open area. On the dorsal aspect of her right great toe/MTP joint the wound is now divided into 2 however the proximal area is deep and there is palpable bone 01/01/17; still an open area in the middle of her original right surgical scar. The area on the right third toe and right lateral fifth metatarsal head remained closed. Problematic area on the dorsal aspect of the toe. Previous surgery in this area line 01/08/17 small open area on the original scar on her upper anterior leg although this is closing. X-ray I did of the right first toe did not show underlying bony abnormality. Still this area on the dorsal first toe probes to bone. We have been using Prisma 01/15/17; small open area in the original scar in the upper anterior leg is almost fully closed. She has 2 open areas over the dorsal aspect of the right first toe that probes to bone. Used and a form starting last week. Vigorous bone scraping that I did last week  showed few methicillin sensitive staph aureus. She is allergic to penicillin and sulfa drugs. I'm going to give her 2 weeks of doxycycline. She will need an MRI with contrast. 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 She continues on doxycycline for a bone scraping that showed methicillin sensitive staph aureus [allergic to penicillin and sulfa]. We have been using Endo form to the wound. 01/29/17; surprisingly her MRI did not show osteomyelitis. She continues on doxycycline for a bone scraping that showed methicillin sensitive staph aureus with allergies to penicillin and sulfa. We have been using Endo form to the wound. Unfortunately I cannot get a surface on this visit looks like it is able to support a healing state. Proximally there is still exposed bone. There is no overt soft tissue tenderness. Her MRI did show a previous fracture was surgery to this area but no hardware 02/12/17 on evaluation today patient appears to be doing well in regard  to her lower extremity wound. She does have some mild discomfort but this is minimal. There's no evidence of infection. Her left great toe nail has somewhat been lifted up and there is a little bit of slight bleeding underneath but this is still firmly attached. 02/19/17; she ran out of doxycycline 2 days ago. Nevertheless I would like to continue this for 2 weeks to make a full 6 weeks of therapy as the bone scraping that I did from the open area on the dorsal right first toe showed MRSA. This should complete antibiotic therapy. 02/26/17; the patient is completing her 6 weeks of oral doxycycline. Deep wound on the dorsal right toe. This still has some exposed bone proximally. She does have a reasonably granulated surface albeit thin over bone. I've been using Endo form have made application for an amniotic skin sub 03/05/17; the patient has completed 6 weeks of doxycycline. This  is a deep wound on the dorsal right toe which has exposed bone proximally. She has granulation over most of this wound albeit a thin layer. I've been using Endo form. Still do not have approval for Affinity 03/13/17 on evaluation today patient's right foot wound appears to be doing better measurement wise compared to her last evaluation. She has been tolerating the dressing change without complication and does have a appointment with the dietary that has been made as far as referral is concerned there just waiting for her and transportation to contact them back for an actual date. Nonetheless patient has been having less pain at this point. No fevers, chills, nausea, or vomiting noted at this time. 03/26/17; linear wound over the dorsal aspect of her right great toe. At one point this had exposed bone on the most superior aspect however I am pleased to see today that this appears to have a surface of granulation. Apparently product was applied for but denied by [facility]. We'll need to see what that was. The patient has known PAD status post revascularization. MRI did not show osteomyelitis which was done in June 04/02/17; continued improvement using Endoform. She was approved for Oasis but hasn't over $119 co-pay per application, this is beyond her means Rasool, Arvada V. ([id number]) 04/09/17; continues on Endoform. 04/16/17; appears to be doing nicely continuing on Endoform 04/22/17; patient did not have her dressing changed all week because of the weather. Using Endoform. Base of the wound looks healthy elbow there appears to events surrounding maceration perhaps because of the drainage was not changing the wound dressing 04/30/17 wound today continues to close and except for a small divot on the superior part of the wound. This area did not probe the bone but I found this a little concerning as this was the area with exposed bone before we be able to get this to granulate forward. As I remember things  she is not a candidate for skin substitutes secondary to an outlandish co-pay. I asked her husband to look into the out of pocket max for medications if he has 1 [Regranex] or totally for skin substitutes 05/07/17; the patient arrives today with a wound roughly the same size although under careful inspection under the light there appears to be some epithelialization. Her intake nurse noted that the Endoform seemed to be placed over the wound rather than in the deeper divots they could really benefit from the Endoform. At this point I have no plans to change the Endoform as at one point this was at least 33% exposed bone and the rest  of the wound very close to the underlying phalanx 05/14/17; no major change in the size or appearance of the wound. We have been using Endoform for a prolonged period of time with reasonably good improvement in the epithelialization i.e. no exposed bone especially proximally. Unless we've not made a lot of changes in the last several weeks. She does not complain of pain. She was revascularized early this year or perhaps late last year by Dr. Bridgett Larsson and I wonder if he will need to have a look at her, I will need to review the actual vascular procedure which I think was a distal popliteal bypass 05/21/17; switched to either for a blue last week. Patient arrived in clinic with the wound not measuring any better but the surface looking some better. Unfortunately she had some surface slough and when I went to remove this superiorly she had exposed bone. Previously she had had exposed bone in the inferior part of the wound however this is granulated over some weeks ago. Clearly this is a major step back for her. With some difficulty I was able to obtain a piece of the bone probing through the superior part of the wound for culture. We did not send pathology. It is likely that she is going to need imaging of this site but I did not order this today 05/28/17; bone culture I took  last week showed MSSA. She still has exposed bone however I could not get another piece for pathology. She had an MRI 4 months ago that did not show osteoarthritic at time. Wound rapidly deteriorated superiorly and I suspect this is underlying osteomyelitis. We have been using Hydrofera Blue 06/04/17 on evaluation today patient's wound appears to actually be doing a little bit better visually compared to last week's evaluation which is good news. Nonetheless she does have osteomyelitis of the toe which does not appear to be MRSA. No fevers, chills, nausea, or vomiting noted at this time. She is having no discomfort. 06/11/17; patient's wound looks a little healthier than I remember seeing it 2 weeks ago. There is no exposed bone and the surface of the wound appears well granulated. We've been using hydrofera blue. I note that she was started on doxycycline which was MSSA. Her appointment with Dr. Ola Spurr is on November 12 I believe. 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 Vallarie Mare of vascular surgery on 11/9. He offered right leg angiography possible intervention however the patient refused. I've discussed this with the patient's husband today she did not want any further procedures. Also noteworthy that Dr. Vallarie Mare stated that this wound had not healed in 3 years, I was not aware of this degree of chronicity in this area. She has underlying osteomyelitis by bone culture. Culture of this grew MSSA, she is allergic to penicillin and therefore not a candidate for beta lactams. She saw Dr. Ola Spurr of infectious disease this morning, he continued her on doxycycline for another month and apparently sent in a prescription. We have been using Hydrofera Blue. ABI's update by Dr. Bridgett Larsson right 0.62, left 0.89. Monophasic wave forms 06/25/17 on evaluation today patient appears to be doing well in regard to her right great toe wound. She is still taking the doxycycline as prescribed  by Dr. Ola Spurr. The Hydrofera Blue Dressing's to be doing as well as anything has for her up to this point and we are still waiting on an insurance authorization for the [employer]. She is having no pain which is good news.  No fevers, chills, nausea, or vomiting noted at this time. 07/02/17; still taking doxycycline as prescribed by Dr. Ola Spurr. Hydrofera Blue continues. We have no palpable bone. Still have not had had approval of Apligraf 08/06/16; the patient arrives today with [facility] even though we apparently had changed to Sorbact last time. Her co-pay for Regranex is $389 in discussion with the patient and her husband this was excessive. We'll continue with the Sorbact and standard dressings for now 08/20/17; we have been using sorbact. The wound bed still requires debridement. Wound measurements are slightly better. 09/03/17;using Sorbact.no debridement today. Wound measurements slightly better. There is some discoloration of the tip of her toelooks like bruising 09/17/17; using Sorbact. No debridement today. Wound dimensions are about the same. Still some discoloration at the tip of her great toe. This does not look any worse than last time 10/01/17; using sorbact right great toe I thought she might have surface epithelialization last time although it is certainly not look like that today nevertheless her dimensions are better. Continued surface eschar on the tip of her toe but this is not Fonder, Laasya V. ([id number]) progressing. She is actually complaining of the left great toe 10/15/17; this is a very difficult area on the dorsal aspect of her proximal right great toe. At one point this wound had exposed bone however we have managed to get some degree of epithelialization. She was revascularized by Dr. Vallarie Mare in [place]. She saw Dr. Ola Spurr for underlying osteomyelitis. She is not a candidate for advanced treatment options [insurance issues]. She comes in today with a new area  on the tip of her right great toe. The exact history here is unclear. We have been using sorbact 10/29/17; patient arrives today with very significant bilateral increase edema which appears to extend into the thighs. This is tight and not particularly pitting however it looks a lot more impressive than I remember. Not surprisingly the wounds on her right great toe have increased in size with weeping edema fluid as the edema extends into the dorsal foot itself. She also has a wound on the tip of her right great toe which is a new wound from 2 weeks ago. We have been using sorbact. 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 He is on Demadex presumably twice a day at 20 mg. I have asked her family to make her an appointment with her primary physicians at the [place] [facility] to go over this degree of edema. This is causing I think deterioration in the right great toe wound. She also has significant PAD and is status post right leg bypass graft. I think this was done by [facility] and vascular. I believe they also said that there was nothing further that could be done with regards to her vascular status. 11/12/17; patient is going to see her primary doctor this afternoon with regards to lower extremity edema. She has 2 open wounds on the right great toe the original wound on the dorsal proximal part and then a more necrotic looking area on the tip of her right great toe. We took some time today to review her vascular status. The patient had a relate below knee popliteal to peroneal artery bypass with reversed greater saphenous vein on 06/13/17; she also had endarterectomy of the midsegment of the peroneal artery. The patient's course was complicated by a bilateral CVA and was found to have near occlusion of the left internal  carotid artery that required interventional radiology stenting. I think at some point in time after  that she was offered an arteriogram on the right but she declined. I've used Silver collagen to her wounds recently. Electronic Signature(s) Signed: 11/12/2017 5:02:04 PM By: Linton Ham MD Entered By: Linton Ham on 11/12/2017 11:18:59 Missildine, Dalary [name] ([id number]) -------------------------------------------------------------------------------- Physical Exam Details Patient Name: Levi, Elia V. Date of Service: 11/12/2017 10:00 AM Medical Record Number: [id number] Patient Account Number: [id number] Date of Birth/Sex: 15-Mar-1941 (77 y.o. F) Treating RN: Cornell Barman Primary Care Provider: Beverlyn Roux Other Clinician: Referring Provider: Beverlyn Roux Treating Provider/Extender: Tito Dine in Treatment: 23 Constitutional Patient is hypertensive.[name] Heart rate irregular.[name] Respirations regular, non-labored and within target range.. Temperature is normal and within the target range for the patient.[name] appears in no distress. Notes wound exam #1 original wound on the dorsal right great toe seems smaller than last time. Adherent surface debris gently debrided with a #3 curet. Hemostasis with direct pressure. Subcutaneous tissue also removed. The wound looks better #2 she has a small area of adherent eschar on the tip of the right first toe which looks ischemic. I did not debridement this. oWithin using silver collagen Electronic Signature(s) Signed: 11/12/2017 5:02:04 PM By: Linton Ham MD Entered By: Linton Ham on 11/12/2017 11:29:24 Leadbetter, [name] ([id number]) -------------------------------------------------------------------------------- Physician Orders Details Patient Name: Ehinger, Shanel V. Date of Service: 11/12/2017 10:00 AM Medical Record Number: [id number] Patient Account Number: [id number] Date of Birth/Sex: 1941/03/28 (77 y.o. F) Treating RN: Cornell Barman Primary Care Provider: Beverlyn Roux Other Clinician: Referring Provider: Beverlyn Roux Treating  Provider/Extender: Tito Dine in Treatment: 60 Verbal / Phone Orders: No Diagnosis Coding Wound Cleansing Wound #11 Right Toe Great o Clean wound with Normal Saline. o Cleanse wound with mild soap and water Wound #7 Right,Dorsal Metatarsal head first o Clean wound with Normal Saline. o Cleanse wound with mild soap and water Anesthetic (add to Medication List) Wound #11 Right Toe Great o Topical Lidocaine 4% cream applied to wound bed prior to debridement (In Clinic Only). Wound #7 Right,Dorsal Metatarsal head first o Topical Lidocaine 4% cream applied to wound bed prior to debridement (In Clinic Only). Primary Wound Dressing Wound #11 Right Toe Great o Silver Collagen Wound #7 Right,Dorsal Metatarsal head first o Silver Collagen Secondary Dressing Wound #11 Right Toe Great o Conform/Kerlix o Drawtex Wound #7 Right,Dorsal Metatarsal head first o Conform/Kerlix o Drawtex Dressing Change Frequency Wound #11 Right Toe Great o Change Dressing Monday, Wednesday, Friday Wound #7 Right,Dorsal Metatarsal head first o Change Dressing Monday, Wednesday, Friday Follow-up Appointments Wound #11 Right Toe Great o Return Appointment in 2 weeks. Parmelee, Rashelle V. ([id number]) Wound #7 Right,Dorsal Metatarsal head first o Return Appointment in 2 weeks. Edema Control Wound #11 Right Toe Great o Elevate legs to the level of the heart and pump ankles as often as possible Wound #7 Right,Dorsal Metatarsal head first o Elevate legs to the level of the heart and pump ankles as often as possible Additional Orders / Instructions Wound #11 Right Toe Great o Increase protein intake. o Activity as tolerated Wound #7 Right,Dorsal Metatarsal head first o Increase protein intake. o Activity as tolerated Home Health Wound #11 Right [place] [facility] weekly, [facility]-Wednesday o Home Health  Nurse may visit PRN to address patientos wound care needs. o FACE TO FACE ENCOUNTER: MEDICARE and MEDICAID PATIENTS: I certify that this patient is under my care  and that I had a face-to-face encounter that meets the physician face-to-face encounter requirements with this patient on this date. The encounter with the patient was in whole or in part for the following MEDICAL CONDITION: (primary reason for [place]) MEDICAL NECESSITY: I certify, that based on my findings, NURSING services are a medically necessary home health service. HOME BOUND STATUS: I certify that my clinical findings support that this patient is homebound (i.e., Due to illness or injury, pt requires aid of supportive devices such as crutches, cane, wheelchairs, walkers, the use of special transportation or the assistance of another person to leave their place of residence. There is a normal inability to leave the home and doing so requires considerable and taxing effort. Other absences are for medical reasons / religious services and are infrequent or of short duration when for other reasons). o If current dressing causes regression in wound condition, may D/C ordered dressing product/s and apply Normal Saline Moist Dressing daily until next [place] / Other MD appointment. [place] of regression in wound condition at [phone]. o Please direct any NON-WOUND related issues/requests for orders to patient's [place] [facility] twice weekly, [place] Nurse may visit PRN to address patientos wound care needs. o FACE TO FACE ENCOUNTER: MEDICARE and MEDICAID PATIENTS: I certify that this patient is under my care and that I had a face-to-face encounter that meets the physician face-to-face encounter requirements with this patient on this date. The encounter with the patient was in whole or in part for the  following MEDICAL CONDITION: (primary reason for [place]) MEDICAL NECESSITY: I certify, that based on my findings, NURSING services are a medically necessary home health service. HOME BOUND STATUS: I certify that my clinical findings support that this patient is homebound (i.e., Due to illness or injury, pt requires aid of supportive devices such as crutches, cane, wheelchairs, walkers, the use of special transportation or the assistance of another person to leave their place of residence. There is a normal inability to leave the home and doing so requires considerable and taxing effort. Other absences are for medical reasons / religious services and are infrequent or of short duration when for other reasons). o If current dressing causes regression in wound condition, may D/C ordered dressing product/s and apply Normal Saline Moist Dressing daily until next [place] / Other MD appointment. [place] of regression in wound condition at [phone]. o Please direct any NON-WOUND related issues/requests for orders to patient's Primary Care Physician Wound #7 Right,Dorsal Metatarsal head first Kemple, Jo V. ([id number]) o [place] [facility] twice weekly, [place] Nurse may visit PRN to address patientos wound care needs. o FACE TO FACE ENCOUNTER: MEDICARE and MEDICAID PATIENTS: I certify that this patient is under my care and that I had a face-to-face encounter that meets the physician face-to-face encounter requirements with this patient on this date. The encounter with the patient was in whole or in part for the following MEDICAL CONDITION: (primary reason for [place]) MEDICAL NECESSITY: I certify, that based on my findings, NURSING services are a medically necessary home health service. HOME BOUND STATUS: I certify that my clinical findings support that this patient is homebound (i.e.,  Due to illness or injury, pt requires aid of supportive devices such as crutches, cane, wheelchairs, walkers, the use of special transportation or the assistance  of another person to leave their place of residence. There is a normal inability to leave the home and doing so requires considerable and taxing effort. Other absences are for medical reasons / religious services and are infrequent or of short duration when for other reasons). o If current dressing causes regression in wound condition, may D/C ordered dressing product/s and apply Normal Saline Moist Dressing daily until next [place] / Other MD appointment. [place] of regression in wound condition at [phone]. o Please direct any NON-WOUND related issues/requests for orders to patient's [place] [facility] twice weekly, [place] Nurse may visit PRN to address patientos wound care needs. o FACE TO FACE ENCOUNTER: MEDICARE and MEDICAID PATIENTS: I certify that this patient is under my care and that I had a face-to-face encounter that meets the physician face-to-face encounter requirements with this patient on this date. The encounter with the patient was in whole or in part for the following MEDICAL CONDITION: (primary reason for [place]) MEDICAL NECESSITY: I certify, that based on my findings, NURSING services are a medically necessary home health service. HOME BOUND STATUS: I certify that my clinical findings support that this patient is homebound (i.e., Due to illness or injury, pt requires aid of supportive devices such as crutches, cane, wheelchairs, walkers, the use of special transportation or the assistance of another person to leave their place of residence. There is a normal inability to leave the home and doing so requires considerable and taxing effort. Other absences are for medical reasons /  religious services and are infrequent or of short duration when for other reasons). o If current dressing causes regression in wound condition, may D/C ordered dressing product/s and apply Normal Saline Moist Dressing daily until next [place] / Other MD appointment. [place] of regression in wound condition at [phone]. o Please direct any NON-WOUND related issues/requests for orders to patient's Primary Care Physician Medications-please add to medication list. Wound #11 Right Toe Great o P.O. Antibiotics - continue antibiotics as prescribed Wound #7 Right,Dorsal Metatarsal head first o P.O. Antibiotics - continue antibiotics as prescribed Electronic Signature(s) Signed: 11/12/2017 1:43:28 PM By: Gretta Cool, BSN, RN, CWS, Kim RN, BSN Signed: 11/12/2017 5:02:04 PM By: Linton Ham MD Entered By: Gretta Cool, BSN, RN, CWS, Kim on 11/12/2017 10:45:13 Schwanke, [name] ([id number]) -------------------------------------------------------------------------------- Problem List Details Patient Name: Pidgeon, Karene V. Date of Service: 11/12/2017 10:00 AM Medical Record Number: [id number] Patient Account Number: [id number] Date of Birth/Sex: [birth year] (77 y.o. 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 with unspecified 11/06/2016 Yes severity E11.52 Type 2 diabetes mellitus with diabetic peripheral angiopathy 11/06/2016 Yes with gangrene I70.232  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 Date of Service: 11/12/2017 10:00 AM Medical Record Number: [id number] Patient Account Number: [id number] Date of Birth/Sex: 1941/01/07 (77 y.o. F) Treating RN: Cornell Barman Primary Care Provider: Beverlyn Roux Other Clinician: Referring Provider: Beverlyn Roux Treating Provider/Extender: Tito Dine in Treatment: 53 Subjective History of Present Illness (HPI) 08/13/16: This is a 77 year old woman who came predominantly for review of 3 cm in diameter circular wound to the left anterior lateral leg. She was in the ER on 08/01/16 I reviewed their notes. There was apparently pus coming out of the wound at that time and the patient arrived requesting debridement which they don't do in the emergency room. Nevertheless I can't see that they did any x-rays. There were no cultures done. She is a type II diabetic and I a note after the patient was in the clinic that she had a bypass graft from the popliteal to the tibial on the right on 02/28/16. She also had a right greater saphenous vein harvest on the same date for arterial bypass. She is going to have vascular studies including ABIs T ABIs on the right on 08/28/16. The patient's surgery was on 02/28/16 by Dr. Vallarie Mare she had a right below the knee popliteal artery to peroneal artery bypass with reverse greater saphenous vein and an  endarterectomy of the mid segment peroneal artery. Postoperatively she had a strong mild monophasic peroneal signal with a pink foot. It would appear that the patient is had some nonhealing in the surgical saphenous vein harvest site on the left leg. Surprisingly looking [facility] I cannot see much information about this at all. Dr. Lucious Groves notes from 05/29/16 show that the patient's wounds "are not healed" the right first metatarsal wound healed but then opened back up. 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 At that point the patient had a wound VAC to her right calf with regards to the wounds on her dorsal right toe would appear that these are felt to be arterial wounds. She has had surgery on the metatarsal phalangeal in 2015 I Dr. Doran Durand secondary to a right metatarsal phalangeal joint fracture. She is apparently had discoloration around this area since then. 08/28/16; patient arrives with her wounds in much the same condition. The linear vein harvest site and the circular wound below it which I think was a blister. She also has to probing holes in her right great toe and a necrotic eschar on the right second toe. Because of these being arterial wounds I reduced her compression from 3-2 layers this seems to of done satisfactorily she has not had any problems. I cannot see that she is actually had an x-ray ====== 11/06/16 the patient comes in for evaluation of her right lower extremity ulcers. She was here in January 2018 for 2 visits subsequently ended up in the hospital with pneumonia and then to rehabilitation. She has now been discharged from rehabilitation and is home. She has multiple ulcerations to her right lower extremity including the foot and toes. She does have home health in place and they have been placing alginate to the ulcers. She is followed by Dr. Bridgett Larsson of  vascular medicine. 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 She recently saw  him on 3/23. In office ABIs were: Right 0.48 with monophasic flow to the DP, PT, peroneal Left 0.63 with monophasic flow to the DP and PT Her arterial studies indicated a patent right below knee popliteal to peroneal bypass She had an MRI in February 2008 that was negative frosty myelitis but this showed general soft tissue edema in the right foot and lower extremity concerning for cellulitis She is a diabetic, managed with insulin. Her hemoglobin A1c in December 2017 was 8.4 which is a trend up from previous levels. She had blood work in February 2018 which revealed an albumin of 2.6 this appears to be relatively acute as an albumin in November 2017 was 3.7 11/13/16; this is a patient I have not seen since February who is readmitted to our clinic last week. 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 Bridgett Larsson. I have reviewed Dr. Lianne Moris notes from March/23/18. Doppler ABI on that date showed an ABI on the right of 0.48 and on the left of 0.63. Dorsalis pedis waveforms were monophasic bilaterally. There was no waveforms detected at the posterior tibial on the right, monophasic on the left. Dr. Lianne Moris comments were that this patient would have follow-up vascular studies in 3 months including ABIs and right lower extremity arterial duplex. She had an MRI in February that was negative for osteomyelitis but showed generalized edema in [place], Emmerie V. ([id number]) the foot. Last albumin I see was in January at 3.4 we have been using Santyl to the 4 wounds on the right leg the patient is noted today to have widespread edema well up towards her groin this is pitting 2-3+. 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 Normal ejection  fraction. 11/20/16; patient has a follow-up appointment with Dr. Bridgett Larsson on April 23. She is still complaining of a lot of pain in the right foot and right leg. It is not clear to me that this is at all positional however I think it is clear claudication with minimal activity perhaps at rest. At our suggestion she is return to her primary physician's office tomorrow with regards to her pitting lower extremity bilateral edema that I reviewed in detail last week 11/27/16; the follow-up with Dr. Bridgett Larsson was actually on May 23 on April 23 as I stated in my note last week. N/A case all of her wounds seems somewhat smaller. The 2 on the right leg are definitely smaller. The areas on the dorsal right first toe, right third toe and the lateral part of the right fifth metatarsal head all looks smaller but have tightly adherent surfaces. We have been using Santyl 12/04/16; follow-up with Dr. Bridgett Larsson on May 23. 2 small open wounds on the right leg continue to get smaller. The area on the right third total lateral aspect of the right fifth toe also look better. The remaining area on the dorsal first toe still has some depth to it. We have been using Santyl to the toes and collagen on the right 12/11/16; according to patient's husband the follow-up with Dr. Bridgett Larsson is not until July. All of the wounds on the right leg are measuring smaller. We have been using some combination of Prisma and Santyl although I think we can go to straight Prisma today. There may have been some confusion with home health about the primary dressing orders here. 12/25/16; the patient has had some healing this  week. The area on her right lateral fifth metatarsal head, right third toe are both healed lower right leg is healed. In the vein harvest site superiorly she has one superficial open area. On the dorsal aspect of her right great toe/MTP joint the wound is now divided into 2 however the proximal area is deep and there is palpable bone 01/01/17; still  an open area in the middle of her original right surgical scar. The area on the right third toe and right lateral fifth metatarsal head remained closed. Problematic area on the dorsal aspect of the toe. Previous surgery in this area line 01/08/17 small open area on the original scar on her upper anterior leg although this is closing. X-ray I did of the right first toe did not show underlying bony abnormality. Still this area on the dorsal first toe probes to bone. We have been using Prisma 01/15/17; small open area in the original scar in the upper anterior leg is almost fully closed. She has 2 open areas over the dorsal aspect of the right first toe that probes to bone. Used and a form starting last week. Vigorous bone scraping that I did last week showed few methicillin sensitive staph aureus. She is allergic to penicillin and sulfa drugs. I'm going to give her 2 weeks of doxycycline. She will need an MRI with contrast. 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 She continues on doxycycline for a bone scraping that showed methicillin sensitive staph aureus [allergic to penicillin and sulfa]. We have been using Endo form to the wound. 01/29/17; surprisingly her MRI did not show osteomyelitis. She continues on doxycycline for a bone scraping that showed methicillin sensitive staph aureus with allergies to penicillin and sulfa. We have been using Endo form to the wound. Unfortunately I cannot get a surface on this visit looks like it is able to support a healing state. Proximally there is still exposed bone. There is no overt soft tissue tenderness. Her MRI did show a previous fracture was surgery to this area but no hardware 02/12/17 on evaluation today patient appears to be doing well in regard to her lower extremity wound. She does have some mild discomfort but this is minimal. There's no evidence of infection. Her left great  toe nail has somewhat been lifted up and there is a little bit of slight bleeding underneath but this is still firmly attached. 02/19/17; she ran out of doxycycline 2 days ago. Nevertheless I would like to continue this for 2 weeks to make a full 6 weeks of therapy as the bone scraping that I did from the open area on the dorsal right first toe showed MRSA. This should complete antibiotic therapy. 02/26/17; the patient is completing her 6 weeks of oral doxycycline. Deep wound on the dorsal right toe. This still has some exposed bone proximally. She does have a reasonably granulated surface albeit thin over bone. I've been using Endo form have made application for an amniotic skin sub 03/05/17; the patient has completed 6 weeks of doxycycline. This is a deep wound on the dorsal right toe which has exposed bone proximally. She has granulation over most of this wound albeit a thin layer. I've been using Endo form. Still do not have approval for [facility] 03/13/17 on evaluation today patient's right foot wound appears to be doing better measurement wise compared to her last evaluation. She has been tolerating  the dressing change without complication and does have a appointment with the dietary that has been made as far as referral is concerned there just waiting for her and transportation to contact them back for an actual date. Nonetheless patient has been having less pain at this point. No fevers, chills, nausea, or vomiting noted at this time. 03/26/17; linear wound over the dorsal aspect of her right great toe. At one point this had exposed bone on the most superior aspect however I am pleased to see today that this appears to have a surface of granulation. Apparently product was applied for but denied by [facility]. We'll need to see what that was. The patient has known PAD status post revascularization. MRI did not show osteomyelitis which was done in June 04/02/17; continued improvement using Endoform. She  was approved for [facility] but hasn't over $381 co-pay per application, Alonso, Laken V. ([id number]) this is beyond her means 04/09/17; continues on Endoform. 04/16/17; appears to be doing nicely continuing on Endoform 04/22/17; patient did not have her dressing changed all week because of the weather. Using Endoform. Base of the wound looks healthy elbow there appears to events surrounding maceration perhaps because of the drainage was not changing the wound dressing 04/30/17 wound today continues to close and except for a small divot on the superior part of the wound. This area did not probe the bone but I found this a little concerning as this was the area with exposed bone before we be able to get this to granulate forward. As I remember things she is not a candidate for skin substitutes secondary to an outlandish co-pay. I asked her husband to look into the out of pocket max for medications if he has 1 [Regranex] or totally for skin substitutes 05/07/17; the patient arrives today with a wound roughly the same size although under careful inspection under the light there appears to be some epithelialization. Her intake nurse noted that the Endoform seemed to be placed over the wound rather than in the deeper divots they could really benefit from the Endoform. At this point I have no plans to change the Endoform as at one point this was at least 33% exposed bone and the rest of the wound very close to the underlying phalanx 05/14/17; no major change in the size or appearance of the wound. We have been using Endoform for a prolonged period of time with reasonably good improvement in the epithelialization i.e. no exposed bone especially proximally. Unless we've not made a lot of changes in the last several weeks. She does not complain of pain. She was revascularized early this year or perhaps late last year by Dr. Bridgett Larsson and I wonder if he will need to have a look at her, I will need to review the actual  vascular procedure which I think was a distal popliteal bypass 05/21/17; switched to either for a blue last week. Patient arrived in clinic with the wound not measuring any better but the surface looking some better. Unfortunately she had some surface slough and when I went to remove this superiorly she had exposed bone. Previously she had had exposed bone in the inferior part of the wound however this is granulated over some weeks ago. Clearly this is a major step back for her. With some difficulty I was able to obtain a piece of the bone probing through the superior part of the wound for culture. We did not send pathology. It is likely that she is going to  need imaging of this site but I did not order this today 05/28/17; bone culture I took last week showed MSSA. She still has exposed bone however I could not get another piece for pathology. She had an MRI 4 months ago that did not show osteoarthritic at time. Wound rapidly deteriorated superiorly and I suspect this is underlying osteomyelitis. We have been using Hydrofera Blue 06/04/17 on evaluation today patient's wound appears to actually be doing a little bit better visually compared to last week's evaluation which is good news. Nonetheless she does have osteomyelitis of the toe which does not appear to be MRSA. No fevers, chills, nausea, or vomiting noted at this time. She is having no discomfort. 06/11/17; patient's wound looks a little healthier than I remember seeing it 2 weeks ago. There is no exposed bone and the surface of the wound appears well granulated. We've been using hydrofera blue. I note that she was started on doxycycline which was MSSA. Her appointment with Dr. Ola Spurr is on November 12 I believe. 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 Vallarie Mare of vascular surgery on 11/9. He offered right leg angiography possible intervention however the patient refused. I've discussed this with the patient's  husband today she did not want any further procedures. Also noteworthy that Dr. Vallarie Mare stated that this wound had not healed in 3 years, I was not aware of this degree of chronicity in this area. She has underlying osteomyelitis by bone culture. Culture of this grew MSSA, she is allergic to penicillin and therefore not a candidate for beta lactams. She saw Dr. Ola Spurr of infectious disease this morning, he continued her on doxycycline for another month and apparently sent in a prescription. We have been using Hydrofera Blue. ABI's update by Dr. Bridgett Larsson right 0.62, left 0.89. Monophasic wave forms 06/25/17 on evaluation today patient appears to be doing well in regard to her right great toe wound. She is still taking the doxycycline as prescribed by Dr. Ola Spurr. The Hydrofera Blue Dressing's to be doing as well as anything has for her up to this point and we are still waiting on an insurance authorization for the [place]. She is having no pain which is good news. No fevers, chills, nausea, or vomiting noted at this time. 07/02/17; still taking doxycycline as prescribed by Dr. Ola Spurr. Hydrofera Blue continues. We have no palpable bone. Still have not had had approval of Apligraf 08/06/16; the patient arrives today with [facility] even though we apparently had changed to Sorbact last time. Her co-pay for Regranex is $389 in discussion with the patient and her husband this was excessive. We'll continue with the Sorbact and standard dressings for now 08/20/17; we have been using sorbact. The wound bed still requires debridement. Wound measurements are slightly better. 09/03/17;using Sorbact.no debridement today. Wound measurements slightly better. There is some discoloration of the tip of her toelooks like bruising 09/17/17; using Sorbact. No debridement today. Wound dimensions are about the same. Still some discoloration at the tip of her great toe. This does not look any worse than last  time 10/01/17; using sorbact right great toe I thought she might have surface epithelialization last time although it is certainly not Hilger, Calley V. ([id number]) look like that today nevertheless her dimensions are better. Continued surface eschar on the tip of her toe but this is not progressing. She is actually complaining of the left great toe 10/15/17; this is a very difficult area on the dorsal aspect of her proximal  right great toe. At one point this wound had exposed bone however we have managed to get some degree of epithelialization. She was revascularized by Dr. Vallarie Mare in [place]. She saw Dr. Ola Spurr for underlying osteomyelitis. She is not a candidate for advanced treatment options [insurance issues]. She comes in today with a new area on the tip of her right great toe. The exact history here is unclear. We have been using sorbact 10/29/17; patient arrives today with very significant bilateral increase edema which appears to extend into the thighs. This is tight and not particularly pitting however it looks a lot more impressive than I remember. Not surprisingly the wounds on her right great toe have increased in size with weeping edema fluid as the edema extends into the dorsal foot itself. She also has a wound on the tip of her right great toe which is a new wound from 2 weeks ago. We have been using sorbact. 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 He is on Demadex presumably twice a day at 20 mg. I have asked her family to make her an appointment with her primary physicians at the [facility] to go over this degree of edema. This is causing I think deterioration in the right great toe wound. She also has significant PAD and is status post right leg bypass graft. I think this was done by [facility]. I believe they also said that there was nothing further that could be done with  regards to her vascular status. 11/12/17; patient is going to see her primary doctor this afternoon with regards to lower extremity edema. She has 2 open wounds on the right great toe the original wound on the dorsal proximal part and then a more necrotic looking area on the tip of her right great toe. We took some time today to review her vascular status. The patient had a relate below knee popliteal to peroneal artery bypass with reversed greater saphenous vein on 06/13/17; she also had endarterectomy of the midsegment of the peroneal artery. 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 I think at some point in time after that she was offered an arteriogram on the right but she declined. I've used Silver collagen to her wounds recently. Objective Constitutional Patient is hypertensive.[name] Heart rate irregular.[name] Respirations regular, non-labored and within target range.. Temperature is normal and within the target range for the patient.[name] appears in no distress. Vitals Time Taken: 10:26 AM, Height: 64 in, Weight: 200 lbs, BMI: 34.3, Temperature: 97.7 F, Pulse: 68 bpm, Respiratory Rate: 18 breaths/min, Blood Pressure: 145/57 mmHg. General Notes: wound exam #1 original wound on the dorsal right great toe seems smaller than last time. Adherent surface debris gently debrided with a #3 curet. Hemostasis with direct pressure. Subcutaneous tissue also removed. The wound looks better #2 she has a small area of adherent eschar on the tip of the right first toe which looks ischemic. I did not debridement this. Within using silver collagen Integumentary (Hair, Skin) Wound #11 status is Open. Original cause of wound was Gradually Appeared. The wound is located on the Right Toe Great. The wound measures 1cm length x 0.9cm width x 0.1cm depth; 0.707cm^2 area and 0.071cm^3 volume.  There is no tunneling or undermining noted. There is a large amount of serosanguineous drainage noted. The  wound margin is distinct with the outline attached to the wound base. There is small (1-33%) pink granulation within the wound bed. There is a large Clutter, Shayda V. ([id number]) (67-100%) amount of necrotic tissue within the wound bed including Adherent Slough. The periwound skin appearance exhibited: Excoriation, Maceration. The periwound skin appearance did not exhibit: Callus, Crepitus, Induration, Rash, Scarring, Dry/Scaly, Atrophie Blanche, Cyanosis, Ecchymosis, Hemosiderin Staining, Mottled, Pallor, Rubor, Erythema. Periwound temperature was noted as No Abnormality. The periwound has tenderness on palpation. Wound #7 status is Open. Original cause of wound was Gradually Appeared. The wound is located on the Right,Dorsal Metatarsal head first. The wound measures 1.8cm length x 0.5cm width x 0.3cm depth; 0.707cm^2 area and 0.212cm^3 volume. There is Fat Layer (Subcutaneous Tissue) Exposed exposed. There is no tunneling or undermining noted. There is a large amount of serous drainage noted. The wound margin is flat and intact. There is no granulation within the wound bed. There is a large (67-100%) amount of necrotic tissue within the wound bed including Eschar. The periwound skin appearance exhibited: Induration, Hemosiderin Staining. The periwound skin appearance did not exhibit: Callus, Crepitus, Excoriation, Rash, Scarring, Dry/Scaly, Maceration, Atrophie Blanche, Cyanosis, Ecchymosis, Mottled, Pallor, Rubor, Erythema. Periwound temperature was noted as No Abnormality. The periwound has tenderness on palpation. 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 There was a Excisional Skin/Subcutaneous Tissue Debridement with a total area of 0.9 sq cm performed by Ricard Dillon, MD. With the following instrument (s): Curette. to remove Viable and Non-Viable tissue/material Material removed includes Subcutaneous Tissue and Skin: Dermis and after achieving pain control using Other (lidocaine 4%). No specimens were taken. A time out was conducted at 10:40, prior to the start of the procedure. A Minimum amount of bleeding was controlled with Pressure. The procedure was tolerated well with a pain level of 0 throughout and a pain level of 0 following the procedure. Post Debridement Measurements: 1.8cm length x 0.5cm width x 0.3cm depth; 0.212cm^3 volume. Character of Wound/Ulcer Post Debridement requires further debridement. Severity of Tissue Post Debridement is: Fat layer exposed. Post procedure Diagnosis Wound #7: Same as Pre-Procedure Plan Newson, Latina V. ([id number]) Wound Cleansing: Wound #11 Right Toe Great: Clean wound with Normal Saline. Cleanse wound with mild soap and water Wound #7 Right,Dorsal Metatarsal head first: Clean wound with Normal Saline. Cleanse wound with mild soap and water Anesthetic (add to Medication List): Wound #11 Right Toe Great: Topical Lidocaine 4% cream applied to wound bed prior to debridement (In Clinic Only). Wound #7 Right,Dorsal Metatarsal  head first: Topical Lidocaine 4% cream applied to wound bed prior to debridement (In Clinic Only). Primary Wound Dressing: Wound #11 Right Toe Great: Silver Collagen Wound #7 Right,Dorsal Metatarsal head first: Silver Collagen Secondary Dressing: Wound #11 Right Toe Great: Conform/Kerlix  Drawtex Wound #7 Right,Dorsal Metatarsal head first: Conform/Kerlix Drawtex Dressing Change Frequency: Wound #11 Right Toe Great: Change Dressing Monday, Wednesday, Friday Wound #7 Right,Dorsal Metatarsal head first: Change Dressing Monday, Wednesday, Friday Follow-up Appointments: Wound #11 Right Toe Great: Return Appointment in 2 weeks. Wound #7 Right,Dorsal Metatarsal head first: Return Appointment in 2 weeks. Edema Control: Wound #11 Right Toe Great: Elevate legs to the level of the heart and pump ankles as often as possible Wound #7 Right,Dorsal Metatarsal head first: Elevate legs to the level of the heart and pump ankles as often as possible Additional Orders / Instructions: Wound #11 Right Toe Great: Increase protein intake. Activity as tolerated Wound #7 Right,Dorsal Metatarsal head first: Increase protein intake. Activity as tolerated Home Health: Wound #11 Right Toe Great: Continue Home Health Visits - [facility] twice weekly, [facility] Health Nurse may visit PRN to address patient s wound care needs. FACE TO FACE ENCOUNTER: MEDICARE and MEDICAID PATIENTS: I certify that this patient is under my care and that I had a face-to-face encounter that meets the physician face-to-face encounter requirements with this patient on this date. The encounter with the patient was in whole or in part for the following MEDICAL CONDITION: (primary reason for [place]) MEDICAL NECESSITY: I certify, that based on my findings, NURSING services are a medically necessary home health service. HOME BOUND STATUS: I certify that my clinical findings support that this patient is  homebound (i.e., Due to illness or injury, pt requires aid of supportive devices such as crutches, cane, wheelchairs, walkers, the use of special transportation or the assistance of another person to leave their place of residence. There is a normal inability to leave the home and doing so requires considerable and taxing effort. Other absences are for medical reasons / religious services and are infrequent or of short duration when for other reasons). Laurent, Mahsa V. ([id number]) If current dressing causes regression in wound condition, may D/C ordered dressing product/s and apply Normal Saline Moist Dressing daily until next [place] / Other MD appointment. [place] of regression in wound condition at [phone]. Please direct any NON-WOUND related issues/requests for orders to patient's [place] Visits - [facility] twice weekly, [facility] Home Health Nurse may visit PRN to address patient s wound care needs. FACE TO FACE ENCOUNTER: MEDICARE and MEDICAID PATIENTS: I certify that this patient is under my care and that I had a face-to-face encounter that meets the physician face-to-face encounter requirements with this patient on this date. The encounter with the patient was in whole or in part for the following MEDICAL CONDITION: (primary reason for [place]) MEDICAL NECESSITY: I certify, that based on my findings, NURSING services are a medically necessary home health service. HOME BOUND STATUS: I certify that my clinical findings support that this patient is homebound (i.e., Due to illness or injury, pt requires aid of supportive devices such as crutches, cane, wheelchairs, walkers, the use of special transportation or the assistance of another person to leave their place of residence. There is a normal inability to leave the home and doing so requires considerable and taxing effort. Other absences are  for medical reasons / religious services and are infrequent or of short duration when for other reasons). If current dressing causes regression in wound condition, may D/C ordered dressing product/s and apply Normal Saline Moist Dressing daily until next [place] / Other MD appointment. [place] of  regression in wound condition at [phone]. Please direct any NON-WOUND related issues/requests for orders to patient's Primary Care Physician Wound #7 Right,Dorsal Metatarsal head first: [place] Visits - [facility] twice weekly, [facility] Home Health Nurse may visit PRN to address patient s wound care needs. FACE TO FACE ENCOUNTER: MEDICARE and MEDICAID PATIENTS: I certify that this patient is under my care and that I had a face-to-face encounter that meets the physician face-to-face encounter requirements with this patient on this date. The encounter with the patient was in whole or in part for the following MEDICAL CONDITION: (primary reason for [place]) MEDICAL NECESSITY: I certify, that based on my findings, NURSING services are a medically necessary home health service. HOME BOUND STATUS: I certify that my clinical findings support that this patient is homebound (i.e., Due to illness or injury, pt requires aid of supportive devices such as crutches, cane, wheelchairs, walkers, the use of special transportation or the assistance of another person to leave their place of residence. There is a normal inability to leave the home and doing so requires considerable and taxing effort. Other absences are for medical reasons / religious services and are infrequent or of short duration when for other reasons). If current dressing causes regression in wound condition, may D/C ordered dressing product/s and apply Normal Saline Moist Dressing daily until next [place] / Other MD appointment. [place] of  regression in wound condition at [phone]. Please direct any NON-WOUND related issues/requests for orders to patient's [place] Visits - [facility] twice weekly, [facility] Home Health Nurse may visit PRN to address patient s wound care needs. FACE TO FACE ENCOUNTER: MEDICARE and MEDICAID PATIENTS: I certify that this patient is under my care and that I had a face-to-face encounter that meets the physician face-to-face encounter requirements with this patient on this date. The encounter with the patient was in whole or in part for the following MEDICAL CONDITION: (primary reason for [place]) MEDICAL NECESSITY: I certify, that based on my findings, NURSING services are a medically necessary home health service. HOME BOUND STATUS: I certify that my clinical findings support that this patient is homebound (i.e., Due to illness or injury, pt requires aid of supportive devices such as crutches, cane, wheelchairs, walkers, the use of special transportation or the assistance of another person to leave their place of residence. There is a normal inability to leave the home and doing so requires considerable and taxing effort. Other absences are for medical reasons / religious services and are infrequent or of short duration when for other reasons). If current dressing causes regression in wound condition, may D/C ordered dressing product/s and apply Normal Saline Moist Dressing daily until next [place] / Other MD appointment. [place] of regression in wound condition at [phone]. Please direct any NON-WOUND related issues/requests for orders to patient's Primary Care Physician Medications-please add to medication list.: Wound #11 Right Toe Great: P.O. Antibiotics - continue antibiotics as prescribed Wound #7 Right,Dorsal Metatarsal head first: P.O. Antibiotics - continue antibiotics as prescribed #1 I'm  going to continue with the Silver collagen which really seems to help the dorsal first toe wound on the right Schneeberger, Antonia V. ([id number]) #2 continue Silver collagen to the tip of the right great toe #3 I reviewed her vascular vein issues which were actually with Dr. Bridgett Larsson not elements seen in vascular. The issue seems to have been an  intra-/perioperative stroke with incidental identification of the left internal carotid artery near total occlusion. They were apparently offered an arteriogram with possible interventions on the right at that time and they refused. This was in 2017. I think it might be nice for her to go back and see Dr. Bridgett Larsson this to make sure there isn't anything that needs to be done additionally on the right that might speed healing of the right great toe wounds. I think the complications surrounding her surgery was true true but not directly related. I'm uncertain if any party would want to run the risk of further procedure however Electronic Signature(s) Signed: 11/12/2017 5:02:04 PM By: Linton Ham MD Entered By: Linton Ham on 11/12/2017 11:32:03 Amsler, [place] ([id number]) -------------------------------------------------------------------------------- SuperBill Details Patient Name: Bresee, Muslima V. Date of Service: 11/12/2017 Medical Record Number: [id number] Patient Account Number: [id number] Date of Birth/Sex: [birth year] (77 y.o. 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 I70.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, right ankle and foot Facility Procedures CPT4 Code Description: [id number] 11042 - DEB SUBQ TISSUE 20 SQ CM/< ICD-10 Diagnosis Description I70.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 o L97.519 Non-pressure chronic ulcer of other part of right foot with unspe Modifier: f other part of f cified severity Quantity: 1 oot Physician Procedures CPT4 Code Description: [id number] 11042 - WC PHYS SUBQ TISS 20 SQ CM ICD-10 Diagnosis Description I70.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 of L97.519 Non-pressure chronic ulcer of other part of right foot with unspec Modifier: other part of fo ified severity Quantity: 1 ot Electronic Signature(s) Signed: 11/12/2017 5:02:04 PM By: Linton Ham MD Entered By: Linton Ham on 11/12/2017 11:32:39

## 2017-11-14 NOTE — Progress Notes (Signed)
[name], White ([id number]) Visit Report for 11/12/2017 Arrival Information Details Patient Name: [name], [name] Date of Service: 11/12/2017 10:00 AM Medical Record Number: [id number] Patient Account Number: [id number] Date of Birth/Sex: 02/05/1941 (77 y.o. F) Treating RN: Montey Hora Primary Care Breaunna Gottlieb: Beverlyn Roux Other Clinician: Referring Gelena Klosinski: Beverlyn Roux Treating Pixie Burgener/Extender: Tito Dine in Treatment: 35 Visit Information History Since Last Visit Added or deleted any medications: No Patient Arrived: Wheel Chair Any new allergies or adverse reactions: No Arrival Time: 10:19 Had a fall or experienced change in No activities of daily living that may affect Accompanied By: dtr risk of falls: Transfer Assistance: Manual Signs or symptoms of abuse/neglect since last visito No Patient Identification Verified: Yes Hospitalized since last visit: No Secondary Verification Process Completed: Yes Implantable device outside of the clinic excluding No Patient Requires Transmission-Based No cellular tissue based products placed in the center Precautions: since last visit: Patient Has Alerts: No Has Dressing in Place as Prescribed: Yes Pain Present Now: No Electronic Signature(s) Signed: 11/12/2017 5:03:40 PM By: Montey Hora Entered By: Montey Hora on 11/12/2017 10:25:50 Knick, [name] ([id number]) -------------------------------------------------------------------------------- Encounter Discharge Information Details Patient Name: [name], [name] Date of Service: 11/12/2017 10:00 AM Medical Record Number: [id number] Patient Account Number: [id number] Date of Birth/Sex: 07-09-1941 (77 y.o. F) Treating RN: Cornell Barman Primary Care Shaqueena Mauceri: Beverlyn Roux Other Clinician: Referring Endia Moncur: Beverlyn Roux Treating Ryeleigh Santore/Extender: Tito Dine in Treatment: 72 Encounter Discharge Information Items Discharge Pain Level: 0 Discharge Condition:  Stable Ambulatory Status: Wheelchair Discharge Destination: Home Transportation: Other Accompanied By: daughter Schedule Follow-up Appointment: Yes Medication Reconciliation completed and No provided to Patient/Care Zonnique Norkus: Provided on Clinical Summary of Care: 11/12/2017 Form Type Recipient Paper Patient [facility] Electronic Signature(s) Signed: 11/12/2017 12:48:09 PM By: Roger Shelter Entered By: Roger Shelter on 11/12/2017 11:05:49 White, [name] ([id number]) -------------------------------------------------------------------------------- Lower Extremity Assessment Details Patient Name: [name], [name] Date of Service: 11/12/2017 10:00 AM Medical Record Number: [id number] Patient Account Number: [id number] Date of Birth/Sex: 03/19/1941 (77 y.o. F) Treating RN: Montey Hora Primary Care Donat Humble: Beverlyn Roux Other Clinician: Referring Jaise Moser: Beverlyn Roux Treating Korin Hartwell/Extender: Tito Dine in Treatment: 65 Vascular Assessment Pulses: Dorsalis Pedis Palpable: [Right:Yes] Posterior Tibial Extremity colors, hair growth, and conditions: Extremity Color: [Right:Hyperpigmented] Hair Growth on Extremity: [Right:No] Temperature of Extremity: [Right:Cool] Capillary Refill: [Right:< 3 seconds] Toe Nail Assessment Left: Right: Thick: Yes Discolored: Yes Deformed: Yes Improper Length and Hygiene: No Electronic Signature(s) Signed: 11/12/2017 5:03:40 PM By: Montey Hora Entered By: Montey Hora on 11/12/2017 10:33:17 [name], [name] ([id number]) -------------------------------------------------------------------------------- Multi Wound Chart Details Patient Name: [name], [name] Date of Service: 11/12/2017 10:00 AM Medical Record Number: [id number] Patient Account Number: [id number] Date of Birth/Sex: 21-Aug-1940 (77 y.o. 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] [7:Lower Extremity] Comorbid History: [11:Cataracts, Chronic sinus problems/congestion, Congestive Heart Failure, Hypertension, Type II Diabetes Hypertension, Type II Diabetes] [7:Cataracts, Chronic sinus problems/congestion, Congestive Heart Failure,] [N/A:N/A] Date Acquired: [11:10/15/2017] [7:08/06/2013] [N/A:N/A] Weeks of Treatment: [11:4] [7:53] [N/A:N/A] Wound Status: [11:Open] [7:Open] [N/A:N/A] Measurements L  x W x D [11:1x0.9x0.1] [7:1.8x0.5x0.3] [N/A:N/A] (cm) Area (cm) : [11:0.707] [7:0.707] [N/A:N/A] Volume (cm) : [11:0.071] [7:0.212] [N/A:N/A] % Reduction in Area: [11:-499.20%] [7:40.00%] [N/A:N/A] % Reduction in Volume: [11:-491.70%] [7:39.90%] [N/A:N/A] Classification: [11:Grade 2] [7:Grade 2] [N/A:N/A] Exudate Amount: [11:Large] [7:Large] [N/A:N/A] Exudate Type: [11:Serosanguineous] [7:Serous] [N/A:N/A] Exudate Color: [11:red, brown] [7:amber] [N/A:N/A] Wound Margin: [11:Distinct, outline attached] [7:Flat and Intact] [N/A:N/A] Granulation Amount: [11:Small (1-33%)] [7:None Present (0%)] [N/A:N/A] Granulation Quality: [11:Pink] [7:N/A] [N/A:N/A] Necrotic Amount: [11:Large (67-100%)] [7:Large (67-100%)] [N/A:N/A] Necrotic Tissue: [11:Adherent Slough] [7:Eschar] [N/A:N/A] Exposed Structures: [11:Fascia: No Fat Layer (Subcutaneous Tissue) Exposed: No Tendon: No Muscle: No Joint: No Bone: No] [7:Fat Layer (Subcutaneous Tissue) Exposed: Yes  Fascia: No Tendon: No Muscle: No Joint: No Bone: No] [N/A:N/A] Epithelialization: [11:Small (1-33%)] [7:Small (1-33%)] [N/A:N/A] Debridement: [11:N/A] [7:Debridement - Excisional] [N/A:N/A] Pre-procedure N/A 10:40 N/A Verification/Time Out Taken: Pain Control: N/A Other N/A Tissue Debrided: N/A Subcutaneous N/A Level: N/A Skin/Subcutaneous Tissue N/A Debridement Area (sq cm): N/A 0.9 N/A Instrument: N/A Curette N/A Bleeding: N/A Minimum N/A Hemostasis Achieved: N/A Pressure N/A Procedural Pain: N/A 0 N/A Post Procedural Pain: N/A 0 N/A Debridement Treatment N/A Procedure was tolerated well N/A Response: Post Debridement N/A 1.8x0.5x0.3 N/A Measurements L x W x D (cm) Post Debridement Volume: N/A 0.212 N/A (cm) Periwound Skin Texture: Excoriation: Yes Induration: Yes N/A Induration: No Excoriation: No Callus: No Callus: No Crepitus: No Crepitus: No Rash: No Rash: No Scarring: No Scarring: No Periwound Skin Moisture: Maceration: Yes Maceration: No N/A Dry/Scaly: No Dry/Scaly: No Periwound Skin Color: Atrophie Blanche: No Hemosiderin Staining: Yes N/A Cyanosis: No Atrophie Blanche: No Ecchymosis: No Cyanosis: No Erythema: No Ecchymosis: No Hemosiderin Staining: No Erythema: No Mottled: No Mottled: No Pallor: No Pallor: No Rubor: No Rubor: No Temperature: No Abnormality No Abnormality N/A Tenderness on Palpation: Yes Yes N/A Wound Preparation: Ulcer Cleansing: Ulcer Cleansing: N/A Rinsed/Irrigated with Saline Rinsed/Irrigated with Saline Topical Anesthetic Applied: Topical Anesthetic Applied: Other: lidocaine 4% Other: lidocaine 4% Procedures Performed: N/A Debridement N/A Treatment Notes Wound #11 (Right Toe Great) 1. Cleansed with: Clean wound with Normal Saline 2. Anesthetic Topical Lidocaine 4% cream to wound bed prior to debridement 4. Dressing Applied: Other dressing (specify in notes) 5. Secondary Dressing Applied ABD  Pad Kerlix/Conform Notes darko shoe Veltri, Shevy V. ([id number]) Wound #7 (Right, Dorsal Metatarsal head first) 1. Cleansed with: Clean wound with Normal Saline 2. Anesthetic Topical Lidocaine 4% cream to wound bed prior to debridement 4. Dressing Applied: Other dressing (specify in notes) 5. Secondary Dressing Applied ABD Pad Kerlix/Conform Notes darko Designer, jewellery Signature(s) Signed: 11/12/2017 5:02:04 PM By: Linton Ham MD Entered By: Linton Ham on 11/12/2017 11:05:48 [name], [name] ([id number]) -------------------------------------------------------------------------------- Multi-Disciplinary Care Plan Details Patient Name: Jasek, Sabula Date of Service: 11/12/2017 10:00 AM Medical Record Number: [id number] Patient Account Number: [id number] Date of Birth/Sex: April 01, 1941 (77 y.o. F) Treating RN: Cornell Barman Primary Care Keanan Melander: Beverlyn Roux Other Clinician: Referring Dannetta Lekas: Beverlyn Roux Treating Briahna Pescador/Extender: Tito Dine in Treatment: 70 Active Inactive ` Abuse / Safety / Falls / Self Care Management Nursing Diagnoses: Impaired physical mobility Potential for falls Goals: Patient will remain injury free Date Initiated: 11/06/2016 Target Resolution Date: 12/30/2016 Goal Status: Active Patient/caregiver will verbalize understanding of skin care regimen Date Initiated: 11/06/2016 Target Resolution Date: 12/30/2016 Goal Status: Active Interventions: Assess fall risk on admission and as needed Treatment Activities: Patient referred to home care : 11/06/2016 Notes: ` Nutrition Nursing Diagnoses: Potential for alteratiion in Nutrition/Potential for imbalanced nutrition Goals: Patient/caregiver  verbalizes understanding of need to maintain therapeutic glucose control per primary care physician Date Initiated: 11/06/2016 Target Resolution Date: 12/30/2016 Goal Status: Active Interventions: Provide education on elevated blood sugars and impact  on wound healing Notes: ` Orientation to the Wound Care Program Nursing Diagnoses: Knowledge deficit related to the wound healing center program [name], [name] ([id number]) Goals: Patient/caregiver will verbalize understanding of the [place] Date Initiated: 11/06/2016 Target Resolution Date: 12/30/2016 Goal Status: Active Interventions: Provide education on orientation to the wound center Notes: ` Venous Leg Ulcer Nursing Diagnoses: Actual venous Insuffiency (use after diagnosis is confirmed) Knowledge deficit related to disease process and management Goals: Non-invasive venous studies are completed as ordered Date Initiated: 11/06/2016 Target Resolution Date: 12/30/2016 Goal Status: Active Patient/caregiver will verbalize understanding of disease process and disease management Date Initiated: 11/06/2016 Target Resolution Date: 12/30/2016 Goal Status: Active Interventions: Assess peripheral edema status every visit. Notes: ` Wound/Skin Impairment Nursing Diagnoses: Impaired tissue integrity Knowledge deficit related to smoking impact on wound healing Knowledge deficit related to ulceration/compromised skin integrity Goals: Ulcer/skin breakdown will heal within 14 weeks Date Initiated: 11/06/2016 Target Resolution Date: 02/05/2017 Goal Status: Active Interventions: Assess ulceration(s) every visit Treatment Activities: Skin care regimen initiated : 11/06/2016 Notes: Electronic Signature(s) Signed: 11/12/2017 1:43:28 PM By: Gretta Cool, BSN, RN, CWS, Kim RN, BSN Malstrom, Lark V. ([id number]) Entered By: Gretta Cool, BSN, RN, CWS, Kim on 11/12/2017 10:41:53 Mckneely, Viviann [name] ([id number]) -------------------------------------------------------------------------------- Pain Assessment Details Patient Name: Lysaght, Ginny V. Date of Service: 11/12/2017 10:00 AM Medical Record Number: [id number] Patient Account Number: [id number] Date of Birth/Sex: 04-27-1941 (77 y.o. F) Treating  RN: Montey Hora Primary Care Notnamed Croucher: Beverlyn Roux Other Clinician: Referring Shaunda Tipping: Beverlyn Roux Treating Namine Beahm/Extender: Tito Dine in Treatment: 7 Active Problems Location of Pain Severity and Description of Pain Patient Has Paino Yes Site Locations Pain Location: Pain in Ulcers With Dressing Change: Yes Duration of the Pain. Constant / Intermittento Intermittent Pain Management and Medication Current Pain Management: Electronic Signature(s) Signed: 11/12/2017 5:03:40 PM By: Montey Hora Entered By: Montey Hora on 11/12/2017 10:26:29 Ueda, Dorea [name] ([id number]) -------------------------------------------------------------------------------- Patient/Caregiver Education Details Patient Name: [name], [name] Date of Service: 11/12/2017 10:00 AM Medical Record Number: [id number] Patient Account Number: [id number] Date of Birth/Gender: [birth year] (77 y.o. F) Treating RN: Roger Shelter Primary Care Physician: Beverlyn Roux Other Clinician: Referring Physician: Beverlyn Roux Treating Physician/Extender: Tito Dine in Treatment: 72 Education Assessment Education Provided To: Patient and Caregiver Education Topics Provided Wound Debridement: Methods: Explain/Verbal Responses: State content correctly Wound/Skin Impairment: Handouts: Caring for Your Ulcer Methods: Explain/Verbal Responses: State content correctly Electronic Signature(s) Signed: 11/12/2017 12:48:09 PM By: Roger Shelter Entered By: Roger Shelter on 11/12/2017 11:06:08 [name], [name] ([id number]) -------------------------------------------------------------------------------- Wound Assessment Details Patient Name: [name], [name] Date of Service: 11/12/2017 10:00 AM Medical Record Number: [id number] Patient Account Number: [id number] Date of Birth/Sex: 26-Jun-1941 (77 y.o. F) Treating RN: Montey Hora Primary Care Simya Tercero: Beverlyn Roux Other Clinician: Referring  Estrella Alcaraz: Beverlyn Roux Treating Damoney Julia/Extender: Tito Dine in Treatment: 72 Wound Status Wound Number: 11 Primary Diabetic Wound/Ulcer of the Lower Extremity Etiology: Wound Location: Right Toe Great Wound Open Wounding Event: Gradually Appeared Status: Date Acquired: 10/15/2017 Comorbid Cataracts, Chronic sinus problems/congestion, Weeks Of Treatment: 4 History: Congestive Heart Failure, Hypertension, Type II Clustered Wound: No Diabetes Photos Photo Uploaded By: Montey Hora on 11/12/2017 11:51:56 Wound Measurements Length: (cm) 1 Width: (cm) 0.9 Depth: (cm) 0.1 Area: (cm) 0.707 Volume: (cm) 0.071 % Reduction in Area: -  499.2% % Reduction in Volume: -491.7% Epithelialization: Small (1-33%) Tunneling: No Undermining: No Wound Description Classification: Grade 2 Wound Margin: Distinct, outline attached Exudate Amount: Large Exudate Type: Serosanguineous Exudate Color: red, brown Foul Odor After Cleansing: No Slough/Fibrino Yes Wound Bed Granulation Amount: Small (1-33%) Exposed Structure Granulation Quality: Pink Fascia Exposed: No Necrotic Amount: Large (67-100%) Fat Layer (Subcutaneous Tissue) Exposed: No Necrotic Quality: Adherent Slough Tendon Exposed: No Muscle Exposed: No Joint Exposed: No Bone Exposed: No Periwound Skin Texture Prestigiacomo, Jaxson V. ([id number]) Texture Color No Abnormalities Noted: No No Abnormalities Noted: No Callus: No Atrophie Blanche: No Crepitus: No Cyanosis: No Excoriation: Yes Ecchymosis: No Induration: No Erythema: No Rash: No Hemosiderin Staining: No Scarring: No Mottled: No Pallor: No Moisture Rubor: No No Abnormalities Noted: No Dry / Scaly: No Temperature / Pain Maceration: Yes Temperature: No Abnormality Tenderness on Palpation: Yes Wound Preparation Ulcer Cleansing: Rinsed/Irrigated with Saline Topical Anesthetic Applied: Other: lidocaine 4%, Treatment Notes Wound #11 (Right Toe  Great) 1. Cleansed with: Clean wound with Normal Saline 2. Anesthetic Topical Lidocaine 4% cream to wound bed prior to debridement 4. Dressing Applied: Other dressing (specify in notes) 5. Secondary Dressing Applied ABD Pad Kerlix/Conform Notes darko shoe Electronic Signature(s) Signed: 11/12/2017 5:03:40 PM By: Montey Hora Entered By: Montey Hora on 11/12/2017 10:32:25 [name], [name] ([id number]) -------------------------------------------------------------------------------- Wound Assessment Details Patient Name: [name], [name] Date of Service: 11/12/2017 10:00 AM Medical Record Number: [id number] Patient Account Number: [id number] Date of Birth/Sex: 24-Apr-1941 (77 y.o. F) Treating RN: Montey Hora Primary Care Demitrius Crass: Beverlyn Roux Other Clinician: Referring Tyde Lamison: Beverlyn Roux Treating Lavine Hargrove/Extender: Tito Dine in Treatment: 72 Wound Status Wound Number: 7 Primary Diabetic Wound/Ulcer of the Lower Extremity Etiology: Wound Location: Right Metatarsal head first - Dorsal Wound Open Wounding Event: Gradually Appeared Status: Date Acquired: 08/06/2013 Comorbid Cataracts, Chronic sinus problems/congestion, Weeks Of Treatment: 53 History: Congestive Heart Failure, Hypertension, Type II Clustered Wound: No Diabetes Photos Photo Uploaded By: Montey Hora on 11/12/2017 11:52:36 Wound Measurements Length: (cm) 1.8 Width: (cm) 0.5 Depth: (cm) 0.3 Area: (cm) 0.707 Volume: (cm) 0.212 % Reduction in Area: 40% % Reduction in Volume: 39.9% Epithelialization: Small (1-33%) Tunneling: No Undermining: No Wound Description Classification: Grade 2 Wound Margin: Flat and Intact Exudate Amount: Large Exudate Type: Serous Exudate Color: amber Foul Odor After Cleansing: No Slough/Fibrino Yes Wound Bed Granulation Amount: None Present (0%) Exposed Structure Necrotic Amount: Large (67-100%) Fascia Exposed: No Necrotic Quality: Eschar Fat Layer  (Subcutaneous Tissue) Exposed: Yes Tendon Exposed: No Muscle Exposed: No Joint Exposed: No Bone Exposed: No Periwound Skin Texture [name], Kerly V. ([id number]) Texture Color No Abnormalities Noted: No No Abnormalities Noted: No Callus: No Atrophie Blanche: No Crepitus: No Cyanosis: No Excoriation: No Ecchymosis: No Induration: Yes Erythema: No Rash: No Hemosiderin Staining: Yes Scarring: No Mottled: No Pallor: No Moisture Rubor: No No Abnormalities Noted: No Dry / Scaly: No Temperature / Pain Maceration: No Temperature: No Abnormality Tenderness on Palpation: Yes Wound Preparation Ulcer Cleansing: Rinsed/Irrigated with Saline Topical Anesthetic Applied: Other: lidocaine 4%, Treatment Notes Wound #7 (Right, Dorsal Metatarsal head first) 1. Cleansed with: Clean wound with Normal Saline 2. Anesthetic Topical Lidocaine 4% cream to wound bed prior to debridement 4. Dressing Applied: Other dressing (specify in notes) 5. Secondary Dressing Applied ABD Pad Kerlix/Conform Notes darko shoe Electronic Signature(s) Signed: 11/12/2017 5:03:40 PM By: Montey Hora Entered By: Montey Hora on 11/12/2017 10:32:52 Strausbaugh, Delainey V. ([id number]) -------------------------------------------------------------------------------- Vitals Details Patient Name: Tedeschi, Tien V. Date of Service: 11/12/2017 10:00  AM Medical Record Number: [id number] Patient Account Number: [id number] Date of Birth/Sex: 1940/10/12 (77 y.o. F) Treating RN: Montey Hora Primary Care Doninique Lwin: Beverlyn Roux Other Clinician: Referring Jacobb Alen: Beverlyn Roux Treating Ottie Neglia/Extender: Tito Dine in Treatment: 39 Vital Signs Time Taken: 10:26 Temperature (F): 97.7 Height (in): 64 Pulse (bpm): 68 Weight (lbs): 200 Respiratory Rate (breaths/min): 18 Body Mass Index (BMI): 34.3 Blood Pressure (mmHg): 145/57 Reference Range: 80 - 120 mg / dl Electronic Signature(s) Signed: 11/12/2017  5:03:40 PM By: Montey Hora Entered By: Montey Hora on 11/12/2017 10:27:49

## 2017-11-26 ENCOUNTER — Encounter: Payer: Medicare HMO | Admitting: Internal Medicine

## 2017-11-26 DIAGNOSIS — E11621 Type 2 diabetes mellitus with foot ulcer: Secondary | ICD-10-CM | POA: Diagnosis not present

## 2017-11-30 NOTE — Progress Notes (Signed)
[name], [name] ([id number]) Visit Report for 11/26/2017 Debridement Details Patient Name: [name], [name] Date of Service: 11/26/2017 10:15 AM Medical Record Number: [id number] Patient Account Number: [id number] Date of Birth/Sex: 09-17-1940 (77 y.o. F) Treating RN: Cornell Barman Primary Care Provider: Beverlyn Roux Other Clinician: Referring Provider: Beverlyn Roux Treating Provider/Extender: Tito Dine in Treatment: 7 Debridement Performed for Wound #11 Right Toe Great Assessment: Performed By: Physician Ricard Dillon, MD Debridement Type: Debridement Severity of Tissue Pre Fat layer exposed Debridement: Pre-procedure Verification/Time Yes - 11:27 Out Taken: Start Time: 11:28 Pain Control: Other : lidocaine 4% Total Area Debrided (L x W): 0.8 (cm) x 0.8 (cm) = 0.64 (cm) Tissue and other material Viable, Non-Viable, Eschar, Subcutaneous debrided: Level: Skin/Subcutaneous Tissue Debridement Description: Excisional Instrument: Curette Bleeding: Minimum Hemostasis Achieved: Pressure End Time: 11:31 Procedural Pain: 0 Post Procedural Pain: 0 Response to Treatment: Procedure was tolerated well Post Debridement Measurements of Total Wound Length: (cm) 0.8 Width: (cm) 0.8 Depth: (cm) 0.2 Volume: (cm) 0.101 Character of Wound/Ulcer Post Debridement: Requires Further Debridement Severity of Tissue Post Debridement: Fat layer exposed Post Procedure Diagnosis Same as Pre-procedure Electronic Signature(s) Signed: 11/26/2017 5:40:08 PM By: Linton Ham MD Signed: 11/26/2017 8:51:29 PM By: Gretta Cool, BSN, RN, CWS, Kim RN, BSN Previous Signature: 11/26/2017 12:27:24 PM Version By: Gretta Cool, BSN, RN, CWS, Kim RN, BSN Entered By: Linton Ham on 11/26/2017 13:29:26 [name] ([id number]) -------------------------------------------------------------------------------- HPI Details Patient Name: [name], [name] Date of Service: 11/26/2017 10:15 AM Medical Record  Number: [id number] Patient Account Number: [id number] Date of Birth/Sex: 1941-06-17 (77 y.o. F) Treating RN: Cornell Barman Primary Care Provider: Beverlyn Roux Other Clinician: Referring Provider: Beverlyn Roux Treating Provider/Extender: Tito Dine in Treatment: 18 History of Present Illness HPI Description: 08/13/16: This is a 77 year old woman who came predominantly for review of 3 cm in diameter circular wound to the left anterior lateral leg. She was in the ER on 08/01/16 I reviewed their notes. There was apparently pus coming out of the wound at that time and the patient arrived requesting debridement which they don't do in the emergency room. Nevertheless I can't see that they did any x-rays. There were no cultures done. She is a type II diabetic and I a note after the patient was in the clinic that she had a bypass graft from the popliteal to the tibial on the right on 02/28/16. She also had a right greater saphenous vein harvest on the same date for arterial bypass. She is going to have vascular studies including ABIs T ABIs on the right on 08/28/16. The patient's surgery was on 02/28/16 by Dr. Vallarie Mare she had a right below the knee popliteal artery to peroneal artery bypass with reverse greater saphenous vein and an endarterectomy of the mid segment peroneal artery. Postoperatively she had a strong mild monophasic peroneal signal with a pink foot. It would appear that the patient is had some nonhealing in the surgical saphenous vein harvest site on the left leg. Surprisingly looking through [facility] I cannot see much information about this at all. Dr. Lucious Groves notes from 05/29/16 show that the patient's wounds "are not healed" the right first metatarsal wound healed but then opened back up. 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 At that point the patient had a wound VAC to her  right calf with regards to the wounds on her dorsal right toe would appear  that these are felt to be arterial wounds. She has had surgery on the metatarsal phalangeal in 2015 I Dr. Doran Durand secondary to a right metatarsal phalangeal joint fracture. She is apparently had discoloration around this area since then. 08/28/16; patient arrives with her wounds in much the same condition. The linear vein harvest site and the circular wound below it which I think was a blister. She also has to probing holes in her right great toe and a necrotic eschar on the right second toe. Because of these being arterial wounds I reduced her compression from 3-2 layers this seems to of done satisfactorily she has not had any problems. I cannot see that she is actually had an x-ray ====== 11/06/16 the patient comes in for evaluation of her right lower extremity ulcers. She was here in January 2018 for 2 visits subsequently ended up in the hospital with pneumonia and then to rehabilitation. She has now been discharged from rehabilitation and is home. She has multiple ulcerations to her right lower extremity including the foot and toes. She does have home health in place and they have been placing alginate to the ulcers. She is followed by Dr. Bridgett Larsson of vascular medicine. 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 She recently saw him on 3/23. In office ABIs were: Right 0.48 with monophasic flow to the DP, PT, peroneal Left 0.63 with monophasic flow to the DP and PT Her arterial studies indicated a patent right below knee popliteal to peroneal bypass She had an MRI in February 2008 that was negative frosty myelitis but this showed general soft tissue edema in the right foot and lower extremity concerning for cellulitis She is a diabetic, managed with insulin. Her hemoglobin A1c in December 2017 was 8.4 which is a trend up from previous levels. She had blood  work in February 2018 which revealed an albumin of 2.6 this appears to be relatively acute as an albumin in November 2017 was 3.7 11/13/16; this is a patient I have not seen since February who is readmitted to our clinic last week. 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 Bridgett Larsson. I have reviewed Dr. Lianne Moris notes from March/23/18. Doppler ABI on that date showed an ABI on the right of 0.48 and on the left of 0.63. Dorsalis pedis waveforms were monophasic bilaterally. There was no waveforms detected at the posterior tibial on the right, monophasic on the left. Dr. Lianne Moris comments were that this patient would have follow-up vascular studies in 3 months including ABIs and right lower extremity arterial duplex. She had an MRI in February that was negative for osteomyelitis but showed generalized edema in Schillaci, Nashley V. ([id number]) the foot. Last albumin I see was in January at 3.4 we have been using Santyl to the 4 wounds on the right leg the patient is noted today to have widespread edema well up towards her groin this is pitting 2-3+. 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 Normal ejection fraction. 11/20/16; patient has a follow-up appointment with Dr. Bridgett Larsson on April 23. She is still complaining of a lot of pain in the right foot and right leg. It is not clear to me that this is at all positional however I think it is clear claudication with minimal activity perhaps at rest. At our suggestion she  is return to her primary physician's office tomorrow with regards to her pitting lower extremity bilateral edema that I reviewed in detail last week 11/27/16; the follow-up with Dr. Bridgett Larsson was actually on May 23 on April 23 as I stated in my note last week. N/A case all of her wounds seems somewhat smaller. The 2 on the right leg are definitely smaller. The areas on the dorsal right  first toe, right third toe and the lateral part of the right fifth metatarsal head all looks smaller but have tightly adherent surfaces. We have been using Santyl 12/04/16; follow-up with Dr. Bridgett Larsson on May 23. 2 small open wounds on the right leg continue to get smaller. The area on the right third total lateral aspect of the right fifth toe also look better. The remaining area on the dorsal first toe still has some depth to it. We have been using Santyl to the toes and collagen on the right 12/11/16; according to patient's husband the follow-up with Dr. Bridgett Larsson is not until July. All of the wounds on the right leg are measuring smaller. We have been using some combination of Prisma and Santyl although I think we can go to straight Prisma today. There may have been some confusion with home health about the primary dressing orders here. 12/25/16; the patient has had some healing this week. The area on her right lateral fifth metatarsal head, right third toe are both healed lower right leg is healed. In the vein harvest site superiorly she has one superficial open area. On the dorsal aspect of her right great toe/MTP joint the wound is now divided into 2 however the proximal area is deep and there is palpable bone 01/01/17; still an open area in the middle of her original right surgical scar. The area on the right third toe and right lateral fifth metatarsal head remained closed. Problematic area on the dorsal aspect of the toe. Previous surgery in this area line 01/08/17 small open area on the original scar on her upper anterior leg although this is closing. X-ray I did of the right first toe did not show underlying bony abnormality. Still this area on the dorsal first toe probes to bone. We have been using Prisma 01/15/17; small open area in the original scar in the upper anterior leg is almost fully closed. She has 2 open areas over the dorsal aspect of the right first toe that probes to bone. Used and a form  starting last week. Vigorous bone scraping that I did last week showed few methicillin sensitive staph aureus. She is allergic to penicillin and sulfa drugs. I'm going to give her 2 weeks of doxycycline. She will need an MRI with contrast. 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 She continues on doxycycline for a bone scraping that showed methicillin sensitive staph aureus [allergic to penicillin and sulfa]. We have been using Endo form to the wound. 01/29/17; surprisingly her MRI did not show osteomyelitis. She continues on doxycycline for a bone scraping that showed methicillin sensitive staph aureus with allergies to penicillin and sulfa. We have been using Endo form to the wound. Unfortunately I cannot get a surface on this visit looks like it is able to support a healing state. Proximally there is still exposed bone. There is no overt soft tissue tenderness. Her MRI did show a previous fracture was surgery to this area but no hardware  02/12/17 on evaluation today patient appears to be doing well in regard to her lower extremity wound. She does have some mild discomfort but this is minimal. There's no evidence of infection. Her left great toe nail has somewhat been lifted up and there is a little bit of slight bleeding underneath but this is still firmly attached. 02/19/17; she ran out of doxycycline 2 days ago. Nevertheless I would like to continue this for 2 weeks to make a full 6 weeks of therapy as the bone scraping that I did from the open area on the dorsal right first toe showed MRSA. This should complete antibiotic therapy. 02/26/17; the patient is completing her 6 weeks of oral doxycycline. Deep wound on the dorsal right toe. This still has some exposed bone proximally. She does have a reasonably granulated surface albeit thin over bone. I've been using Endo form have made application for an amniotic skin  sub 03/05/17; the patient has completed 6 weeks of doxycycline. This is a deep wound on the dorsal right toe which has exposed bone proximally. She has granulation over most of this wound albeit a thin layer. I've been using Endo form. Still do not have approval for Aff[employer] 03/13/17 on evaluation today patient's right foot wound appears to be doing better measurement wise compared to her last evaluation. She has been tolerating the dressing change without complication and does have a appointment with the dietary that has been made as far as referral is concerned there just waiting for her and transportation to contact them back for an actual date. Nonetheless patient has been having less pain at this point. No fevers, chills, nausea, or vomiting noted at this time. 03/26/17; linear wound over the dorsal aspect of her right great toe. At one point this had exposed bone on the most superior aspect however I am pleased to see today that this appears to have a surface of granulation. Apparently product was applied for but denied by [facility]. We'll need to see what that was. The patient has known PAD status post revascularization. MRI did not show osteomyelitis which was done in June 04/02/17; continued improvement using Endoform. She was approved for [facility] but hasn't over $563 co-pay per application, Bustamante, Keerstin V. ([id number]) this is beyond her means 04/09/17; continues on Endoform. 04/16/17; appears to be doing nicely continuing on Endoform 04/22/17; patient did not have her dressing changed all week because of the weather. Using Endoform. Base of the wound looks healthy elbow there appears to events surrounding maceration perhaps because of the drainage was not changing the wound dressing 04/30/17 wound today continues to close and except for a small divot on the superior part of the wound. This area did not probe the bone but I found this a little concerning as this was the area with exposed bone before  we be able to get this to granulate forward. As I remember things she is not a candidate for skin substitutes secondary to an outlandish co-pay. I asked her husband to look into the out of pocket max for medications if he has 1 [Regranex] or totally for skin substitutes 05/07/17; the patient arrives today with a wound roughly the same size although under careful inspection under the light there appears to be some epithelialization. Her intake nurse noted that the Endoform seemed to be placed over the wound rather than in the deeper divots they could really benefit from the Endoform. At this point I have no plans to change the Endoform as at  one point this was at least 33% exposed bone and the rest of the wound very close to the underlying phalanx 05/14/17; no major change in the size or appearance of the wound. We have been using Endoform for a prolonged period of time with reasonably good improvement in the epithelialization i.e. no exposed bone especially proximally. Unless we've not made a lot of changes in the last several weeks. She does not complain of pain. She was revascularized early this year or perhaps late last year by Dr. Bridgett Larsson and I wonder if he will need to have a look at her, I will need to review the actual vascular procedure which I think was a distal popliteal bypass 05/21/17; switched to either for a blue last week. Patient arrived in clinic with the wound not measuring any better but the surface looking some better. Unfortunately she had some surface slough and when I went to remove this superiorly she had exposed bone. Previously she had had exposed bone in the inferior part of the wound however this is granulated over some weeks ago. Clearly this is a major step back for her. With some difficulty I was able to obtain a piece of the bone probing through the superior part of the wound for culture. We did not send pathology. It is likely that she is going to need imaging of this  site but I did not order this today 05/28/17; bone culture I took last week showed MSSA. She still has exposed bone however I could not get another piece for pathology. She had an MRI 4 months ago that did not show osteoarthritic at time. Wound rapidly deteriorated superiorly and I suspect this is underlying osteomyelitis. We have been using Hydrofera Blue 06/04/17 on evaluation today patient's wound appears to actually be doing a little bit better visually compared to last week's evaluation which is good news. Nonetheless she does have osteomyelitis of the toe which does not appear to be MRSA. No fevers, chills, nausea, or vomiting noted at this time. She is having no discomfort. 06/11/17; patient's wound looks a little healthier than I remember seeing it 2 weeks ago. There is no exposed bone and the surface of the wound appears well granulated. We've been using hydrofera blue. I note that she was started on doxycycline which was MSSA. Her appointment with Dr. Ola Spurr is on November 12 I believe. 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 Vallarie Mare of vascular surgery on 11/9. He offered right leg angiography possible intervention however the patient refused. I've discussed this with the patient's husband today she did not want any further procedures. Also noteworthy that Dr. Vallarie Mare stated that this wound had not healed in 3 years, I was not aware of this degree of chronicity in this area. She has underlying osteomyelitis by bone culture. Culture of this grew MSSA, she is allergic to penicillin and therefore not a candidate for beta lactams. She saw Dr. Ola Spurr of infectious disease this morning, he continued her on doxycycline for another month and apparently sent in a prescription. We have been using Hydrofera Blue. ABI's update by Dr. Bridgett Larsson right 0.62, left 0.89. Monophasic wave forms 06/25/17 on evaluation today patient appears to be doing well in regard to her right  great toe wound. She is still taking the doxycycline as prescribed by Dr. Ola Spurr. The Hydrofera Blue Dressing's to be doing as well as anything has for her up to this point and we are still waiting on an insurance authorization  for the [place]. She is having no pain which is good news. No fevers, chills, nausea, or vomiting noted at this time. 07/02/17; still taking doxycycline as prescribed by Dr. Ola Spurr. Hydrofera Blue continues. We have no palpable bone. Still have not had had approval of Apligraf 08/06/16; the patient arrives today with [facility] even though we apparently had changed to Sorbact last time. Her co-pay for Regranex is $389 in discussion with the patient and her husband this was excessive. We'll continue with the Sorbact and standard dressings for now 08/20/17; we have been using sorbact. The wound bed still requires debridement. Wound measurements are slightly better. 09/03/17;using Sorbact.no debridement today. Wound measurements slightly better. There is some discoloration of the tip of her toelooks like bruising 09/17/17; using Sorbact. No debridement today. Wound dimensions are about the same. Still some discoloration at the tip of her great toe. This does not look any worse than last time 10/01/17; using sorbact right great toe I thought she might have surface epithelialization last time although it is certainly not Saxby, Chicquita V. ([id number]) look like that today nevertheless her dimensions are better. Continued surface eschar on the tip of her toe but this is not progressing. She is actually complaining of the left great toe 10/15/17; this is a very difficult area on the dorsal aspect of her proximal right great toe. At one point this wound had exposed bone however we have managed to get some degree of epithelialization. She was revascularized by Dr. Vallarie Mare in [place]. She saw Dr. Ola Spurr for underlying osteomyelitis. She is not a candidate for advanced  treatment options [insurance issues]. She comes in today with a new area on the tip of her right great toe. The exact history here is unclear. We have been using sorbact 10/29/17; patient arrives today with very significant bilateral increase edema which appears to extend into the thighs. This is tight and not particularly pitting however it looks a lot more impressive than I remember. Not surprisingly the wounds on her right great toe have increased in size with weeping edema fluid as the edema extends into the dorsal foot itself. She also has a wound on the tip of her right great toe which is a new wound from 2 weeks ago. We have been using sorbact. 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 He is on Demadex presumably twice a day at 20 mg. I have asked her family to make her an appointment with her primary physicians at the [facility] to go over this degree of edema. This is causing I think deterioration in the right great toe wound. She also has significant PAD and is status post right leg bypass graft. I think this was done by [facility]. I believe they also said that there was nothing further that could be done with regards to her vascular status. 11/12/17; patient is going to see her primary doctor this afternoon with regards to lower extremity edema. She has 2 open wounds on the right great toe the original wound on the dorsal proximal part and then a more necrotic looking area on the tip of her right great toe. We took some time today to review her vascular status. The patient had a relate below knee popliteal to peroneal artery bypass with reversed greater saphenous vein on 06/13/17; she also had endarterectomy of the midsegment of the peroneal artery. The patient's course was complicated by a bilateral  CVA and was found to have near occlusion of the left internal carotid artery that  required interventional radiology stenting. I think at some point in time after that she was offered an arteriogram on the right but she declined. I've used Silver collagen to her wounds recently. 11/26/17; patient has 2 open wounds on the right great toe tip as well as the dorsal aspect of her right great toe. She has known PAD. I've been trying to get her back to see vein and vascular in [facility] to see if there are options for endovascular surgery to help with perfusion although the patient and her husband may not want any more surgery. We've been using silver alginate because of maceration. She does not have an option for advanced treatment/skin substitutes Electronic Signature(s) Signed: 11/26/2017 5:40:08 PM By: Linton Ham MD Entered By: Linton Ham on 11/26/2017 13:11:42 [name] ([id number]) -------------------------------------------------------------------------------- Physical Exam Details Patient Name: [name], [name] Date of Service: 11/26/2017 10:15 AM Medical Record Number: [id number] Patient Account Number: [id number] Date of Birth/Sex: [birth year] (77 y.o. F) Treating RN: Cornell Barman Primary Care Provider: Beverlyn Roux Other Clinician: Referring Provider: Beverlyn Roux Treating Provider/Extender: Tito Dine in Treatment: 68 Constitutional Patient is hypertensive.. Pulse regular and within target range for patient.[name] Respirations regular, non-labored and within target range.. Temperature is normal and within the target range for the patient.[name] appears in no distress. Notes wound exam #1 original wound on the dorsal aspect of the right great toe about the same as last time. No debridement done. #2 necrotic surface over the tip of the right great toe removed with a #5 curet along with subcutaneous tissue. Paradoxically this looks quite good. Electronic Signature(s) Signed: 11/26/2017 5:40:08 PM By: Linton Ham MD Entered By: Linton Ham on  11/26/2017 13:28:58 [name], [name] ([id number]) -------------------------------------------------------------------------------- Physician Orders Details Patient Name: [name], [name] Date of Service: 11/26/2017 10:15 AM Medical Record Number: [id number] Patient Account Number: [id number] Date of Birth/Sex: 07/19/1941 (77 y.o. F) Treating RN: Cornell Barman Primary Care Provider: Beverlyn Roux Other Clinician: Referring Provider: Beverlyn Roux Treating Provider/Extender: Tito Dine in Treatment: 19 Verbal / Phone Orders: No Diagnosis Coding Wound Cleansing Wound #11 Right Toe Great o Clean wound with Normal Saline. o Cleanse wound with mild soap and water Anesthetic (add to Medication List) Wound #11 Right Toe Great o Topical Lidocaine 4% cream applied to wound bed prior to debridement (In Clinic Only). Wound #7 Right,Dorsal Metatarsal head first o Topical Lidocaine 4% cream applied to wound bed prior to debridement (In Clinic Only). Primary Wound Dressing Wound #11 Right Toe Great o Silver Collagen Wound #7 Right,Dorsal Metatarsal head first o Silver Collagen Secondary Dressing Wound #11 Right Toe Great o Conform/Kerlix Wound #7 Right,Dorsal Metatarsal head first o Conform/Kerlix Dressing Change Frequency Wound #11 Right Toe Great o Change Dressing Monday, Wednesday, Friday Follow-up Appointments Wound #11 Right Toe Great o Return Appointment in 2 weeks. Wound #7 Right,Dorsal Metatarsal head first o Return Appointment in 2 weeks. Edema Control Wound #11 Right Toe Great o Elevate legs to the level of the heart and pump ankles as often as possible Wound #7 Right,Dorsal Metatarsal head first Pavlovich, Viva V. ([id number]) o Elevate legs to the level of the heart and pump ankles as often as possible Additional Orders / Instructions Wound #11 Right Toe Great o Increase protein intake. o Activity as tolerated Wound #7 Right,Dorsal  Metatarsal head first o Increase protein intake. o Activity as tolerated Home Health Wound #  11 Right [place] [facility], [facility]-Wednesday o [place] [facility] - [facility] weekly, [facility]-Wednesday o Home Health Nurse may visit PRN to address patientos wound care needs. o Home Health Nurse may visit PRN to address patientos wound care needs. o FACE TO FACE ENCOUNTER: MEDICARE and MEDICAID PATIENTS: I certify that this patient is under my care and that I had a face-to-face encounter that meets the physician face-to-face encounter requirements with this patient on this date. The encounter with the patient was in whole or in part for the following MEDICAL CONDITION: (primary reason for [place]) MEDICAL NECESSITY: I certify, that based on my findings, NURSING services are a medically necessary home health service. HOME BOUND STATUS: I certify that my clinical findings support that this patient is homebound (i.e., Due to illness or injury, pt requires aid of supportive devices such as crutches, cane, wheelchairs, walkers, the use of special transportation or the assistance of another person to leave their place of residence. There is a normal inability to leave the home and doing so requires considerable and taxing effort. Other absences are for medical reasons / religious services and are infrequent or of short duration when for other reasons). o FACE TO FACE ENCOUNTER: MEDICARE and MEDICAID PATIENTS: I certify that this patient is under my care and that I had a face-to-face encounter that meets the physician face-to-face encounter requirements with this patient on this date. The encounter with the patient was in whole or in part for the following MEDICAL CONDITION: (primary reason for [place]) MEDICAL NECESSITY: I certify, that based on my findings, NURSING services are a medically  necessary home health service. HOME BOUND STATUS: I certify that my clinical findings support that this patient is homebound (i.e., Due to illness or injury, pt requires aid of supportive devices such as crutches, cane, wheelchairs, walkers, the use of special transportation or the assistance of another person to leave their place of residence. There is a normal inability to leave the home and doing so requires considerable and taxing effort. Other absences are for medical reasons / religious services and are infrequent or of short duration when for other reasons). o If current dressing causes regression in wound condition, may D/C ordered dressing product/s and apply Normal Saline Moist Dressing daily until next [place] / Other MD appointment. [place] of regression in wound condition at [phone]. o If current dressing causes regression in wound condition, may D/C ordered dressing product/s and apply Normal Saline Moist Dressing daily until next [place] / Other MD appointment. [place] of regression in wound condition at [phone]. o Please direct any NON-WOUND related issues/requests for orders to patient's Primary Care Physician o Please direct any NON-WOUND related issues/requests for orders to patient's Primary Care Physician Wound #7 Right,Dorsal Metatarsal head first o [place] [facility] weekly, [facility]-Wednesday o [place] [facility] - [facility] twice weekly, [place] Nurse may visit PRN to address patientos wound care needs. o Home Health Nurse may visit PRN to address patientos wound care needs. o FACE TO FACE ENCOUNTER: MEDICARE and MEDICAID PATIENTS: I certify that this patient is under my care and that I had a face-to-face encounter that meets the physician face-to-face encounter requirements with this patient on this  date. The encounter with the patient was in whole or in part for  the following MEDICAL CONDITION: (primary reason for Home Healthcare) MEDICAL NECESSITY: I certify, that based on my findings, NURSING services are a medically necessary home health service. HOME BOUND STATUS: I certify that my clinical findings support that this patient is homebound (i.e., Due to illness or injury, pt requires aid of [place], Tash[place] V. ([id number]) supportive devices such as crutches, cane, wheelchairs, walkers, the use of special transportation or the assistance of another person to leave their place of residence. There is a normal inability to leave the home and doing so requires considerable and taxing effort. Other absences are for medical reasons / religious services and are infrequent or of short duration when for other reasons). o FACE TO FACE ENCOUNTER: MEDICARE and MEDICAID PATIENTS: I certify that this patient is under my care and that I had a face-to-face encounter that meets the physician face-to-face encounter requirements with this patient on this date. The encounter with the patient was in whole or in part for the following MEDICAL CONDITION: (primary reason for [place]) MEDICAL NECESSITY: I certify, that based on my findings, NURSING services are a medically necessary home health service. HOME BOUND STATUS: I certify that my clinical findings support that this patient is homebound (i.e., Due to illness or injury, pt requires aid of supportive devices such as crutches, cane, wheelchairs, walkers, the use of special transportation or the assistance of another person to leave their place of residence. There is a normal inability to leave the home and doing so requires considerable and taxing effort. Other absences are for medical reasons / religious services and are infrequent or of short duration when for other reasons). o If current dressing causes regression in wound condition, may D/C  ordered dressing product/s and apply Normal Saline Moist Dressing daily until next [place] / Other MD appointment. [place] of regression in wound condition at [phone]. o If current dressing causes regression in wound condition, may D/C ordered dressing product/s and apply Normal Saline Moist Dressing daily until next [place] / Other MD appointment. [place] of regression in wound condition at [phone]. o Please direct any NON-WOUND related issues/requests for orders to patient's Primary Care Physician o Please direct any NON-WOUND related issues/requests for orders to patient's Primary Care Physician Medications-please add to medication list. Wound #11 Right Toe Great o P.O. Antibiotics - continue antibiotics as prescribed Wound #7 Right,Dorsal Metatarsal head first o P.O. Antibiotics - continue antibiotics as prescribed Electronic Signature(s) Signed: 11/26/2017 12:27:24 PM By: Gretta Cool, BSN, RN, CWS, Kim RN, BSN Signed: 11/26/2017 5:40:08 PM By: Linton Ham MD Entered By: Gretta Cool, BSN, RN, CWS, Kim on 11/26/2017 11:33:37 Wernette, [name] ([id number]) -------------------------------------------------------------------------------- Problem List Details Patient Name: [name], [name] Date of Service: 11/26/2017 10:15 AM Medical Record Number: [id number] Patient Account Number: [id number] Date of Birth/Sex: 1941/04/22 (77 y.o. 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 with unspecified 11/06/2016 Yes severity E11.52 Type 2 diabetes mellitus with diabetic peripheral angiopathy 11/06/2016 Yes with gangrene 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 ([id number]) -------------------------------------------------------------------------------- Progress Note Details Patient Name: [name], [name] Date of Service: 11/26/2017 10:15 AM Medical Record Number: [id number] Patient Account Number: [id number] Date of Birth/Sex: Jul 27, 1941 (77 y.o. F) Treating RN: Cornell Barman Primary Care Provider: Beverlyn Roux Other Clinician: Referring Provider: Beverlyn Roux Treating Provider/Extender: Tito Dine in Treatment: 55 Subjective History of Present Illness (HPI) 08/13/16: This is a 77 year old woman who came predominantly for review of 3 cm in diameter circular wound to the left anterior lateral leg. She was in the ER on 08/01/16 I reviewed their notes. There was apparently pus coming out of the wound at that time and the patient arrived requesting debridement which they don't do in the emergency room. Nevertheless I can't see that they did any x-rays. There were no cultures done. She is a type II diabetic and I a note after the patient was in the clinic that she had a bypass graft from the popliteal to the tibial on the right on 02/28/16. She also had a right greater saphenous vein harvest on the same date for arterial bypass. She is going to have vascular  studies including ABIs T ABIs on the right on 08/28/16. The patient's surgery was on 02/28/16 by Dr. Vallarie Mare she had a right below the knee popliteal artery to peroneal artery bypass with reverse greater saphenous vein and an endarterectomy of the mid segment peroneal artery. Postoperatively she had a strong mild monophasic peroneal signal with a pink foot. It would appear that the patient is had some nonhealing in the surgical saphenous vein harvest site on the left leg. Surprisingly looking [facility] I cannot see much information about this at all. Dr. Lucious Groves notes from 05/29/16 show that the patient's wounds "are not healed" the right first metatarsal wound healed but then opened back up. 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 At that point the patient had a wound VAC to her right calf with regards to the wounds on her dorsal right toe would appear that these are felt to be arterial wounds. She has had surgery on the metatarsal phalangeal in 2015 I Dr. Doran Durand secondary to a right metatarsal phalangeal joint fracture. She is apparently had discoloration around this area since then. 08/28/16; patient arrives with her wounds in much the same condition. The linear vein harvest site and the circular wound below it which I think was a blister. She also has to probing holes in her right great toe and a necrotic eschar on the right second toe. Because of these being arterial wounds I reduced her compression from 3-2 layers this seems to of done satisfactorily she has not had any problems. I cannot see that she is actually had an x-ray ====== 11/06/16 the patient comes in for evaluation of her right lower extremity ulcers. She was here in January 2018 for 2 visits subsequently ended up in the hospital with pneumonia and then to rehabilitation. She has now been discharged from rehabilitation  and is home. She has multiple ulcerations to her right lower extremity including the foot and toes. She does  have home health in place and they have been placing alginate to the ulcers. She is followed by Dr. Bridgett Larsson of vascular medicine. 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 She recently saw him on 3/23. In office ABIs were: Right 0.48 with monophasic flow to the DP, PT, peroneal Left 0.63 with monophasic flow to the DP and PT Her arterial studies indicated a patent right below knee popliteal to peroneal bypass She had an MRI in February 2008 that was negative frosty myelitis but this showed general soft tissue edema in the right foot and lower extremity concerning for cellulitis She is a diabetic, managed with insulin. Her hemoglobin A1c in December 2017 was 8.4 which is a trend up from previous levels. She had blood work in February 2018 which revealed an albumin of 2.6 this appears to be relatively acute as an albumin in November 2017 was 3.7 11/13/16; this is a patient I have not seen since February who is readmitted to our clinic last week. 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 Bridgett Larsson. I have reviewed Dr. Lianne Moris notes from March/23/18. Doppler ABI on that date showed an ABI on the right of 0.48 and on the left of 0.63. Dorsalis pedis waveforms were monophasic bilaterally. There was no waveforms detected at the posterior tibial on the right, monophasic on the left. Dr. Lianne Moris comments were that this patient would have follow-up vascular studies in 3 months including ABIs and right lower [name], [name] ([id number]) extremity arterial duplex. She had an MRI in February that was negative for osteomyelitis but showed generalized edema in the foot. Last albumin I see was in January at 3.4 we have been using Santyl to the 4 wounds on the right leg the  patient is noted today to have widespread edema well up towards her groin this is pitting 2-3+. 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 Normal ejection fraction. 11/20/16; patient has a follow-up appointment with Dr. Bridgett Larsson on April 23. She is still complaining of a lot of pain in the right foot and right leg. It is not clear to me that this is at all positional however I think it is clear claudication with minimal activity perhaps at rest. At our suggestion she is return to her primary physician's office tomorrow with regards to her pitting lower extremity bilateral edema that I reviewed in detail last week 11/27/16; the follow-up with Dr. Bridgett Larsson was actually on May 23 on April 23 as I stated in my note last week. N/A case all of her wounds seems somewhat smaller. The 2 on the right leg are definitely smaller. The areas on the dorsal right first toe, right third toe and the lateral part of the right fifth metatarsal head all looks smaller but have tightly adherent surfaces. We have been using Santyl 12/04/16; follow-up with Dr. Bridgett Larsson on May 23. 2 small open wounds on the right leg continue to get smaller. The area on the right third total lateral aspect of the right fifth toe also look better. The remaining area on the dorsal first toe still has some depth to it. We have been using Santyl to the toes and collagen on the right 12/11/16; according to patient's husband the follow-up with Dr. Bridgett Larsson is not until July. All of the wounds on the right  leg are measuring smaller. We have been using some combination of Prisma and Santyl although I think we can go to straight Prisma today. There may have been some confusion with home health about the primary dressing orders here. 12/25/16; the patient has had some healing this week. The area on her right lateral fifth metatarsal head, right third toe are both healed lower right leg is healed. In the vein harvest  site superiorly she has one superficial open area. On the dorsal aspect of her right great toe/MTP joint the wound is now divided into 2 however the proximal area is deep and there is palpable bone 01/01/17; still an open area in the middle of her original right surgical scar. The area on the right third toe and right lateral fifth metatarsal head remained closed. Problematic area on the dorsal aspect of the toe. Previous surgery in this area line 01/08/17 small open area on the original scar on her upper anterior leg although this is closing. X-ray I did of the right first toe did not show underlying bony abnormality. Still this area on the dorsal first toe probes to bone. We have been using Prisma 01/15/17; small open area in the original scar in the upper anterior leg is almost fully closed. She has 2 open areas over the dorsal aspect of the right first toe that probes to bone. Used and a form starting last week. Vigorous bone scraping that I did last week showed few methicillin sensitive staph aureus. She is allergic to penicillin and sulfa drugs. I'm going to give her 2 weeks of doxycycline. She will need an MRI with contrast. 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 She continues on doxycycline for a bone scraping that showed methicillin sensitive staph aureus [allergic to penicillin and sulfa]. We have been using Endo form to the wound. 01/29/17; surprisingly her MRI did not show osteomyelitis. She continues on doxycycline for a bone scraping that showed methicillin sensitive staph aureus with allergies to penicillin and sulfa. We have been using Endo form to the wound. Unfortunately I cannot get a surface on this visit looks like it is able to support a healing state. Proximally there is still exposed bone. There is no overt soft tissue tenderness. Her MRI did show a previous fracture was surgery to this area but  no hardware 02/12/17 on evaluation today patient appears to be doing well in regard to her lower extremity wound. She does have some mild discomfort but this is minimal. There's no evidence of infection. Her left great toe nail has somewhat been lifted up and there is a little bit of slight bleeding underneath but this is still firmly attached. 02/19/17; she ran out of doxycycline 2 days ago. Nevertheless I would like to continue this for 2 weeks to make a full 6 weeks of therapy as the bone scraping that I did from the open area on the dorsal right first toe showed MRSA. This should complete antibiotic therapy. 02/26/17; the patient is completing her 6 weeks of oral doxycycline. Deep wound on the dorsal right toe. This still has some exposed bone proximally. She does have a reasonably granulated surface albeit thin over bone. I've been using Endo form have made application for an amniotic skin sub 03/05/17; the patient has completed 6 weeks of doxycycline. This is a deep wound on the dorsal right toe which has exposed bone proximally. She  has granulation over most of this wound albeit a thin layer. I've been using Endo form. Still do not have approval for [facility] 03/13/17 on evaluation today patient's right foot wound appears to be doing better measurement wise compared to her last evaluation. She has been tolerating the dressing change without complication and does have a appointment with the dietary that has been made as far as referral is concerned there just waiting for her and transportation to contact them back for an actual date. Nonetheless patient has been having less pain at this point. No fevers, chills, nausea, or vomiting noted at this time. 03/26/17; linear wound over the dorsal aspect of her right great toe. At one point this had exposed bone on the most superior aspect however I am pleased to see today that this appears to have a surface of granulation. Apparently product was  applied for but denied by [facility]. We'll need to see what that was. The patient has known PAD status post revascularization. MRI did not show osteomyelitis which was done in June [place], Denitra V. ([id number]) 04/02/17; continued improvement using Endoform. She was approved for [facility] but hasn't over $878 co-pay per application, this is beyond her means 04/09/17; continues on Endoform. 04/16/17; appears to be doing nicely continuing on Endoform 04/22/17; patient did not have her dressing changed all week because of the weather. Using Endoform. Base of the wound looks healthy elbow there appears to events surrounding maceration perhaps because of the drainage was not changing the wound dressing 04/30/17 wound today continues to close and except for a small divot on the superior part of the wound. This area did not probe the bone but I found this a little concerning as this was the area with exposed bone before we be able to get this to granulate forward. As I remember things she is not a candidate for skin substitutes secondary to an outlandish co-pay. I asked her husband to look into the out of pocket max for medications if he has 1 [Regranex] or totally for skin substitutes 05/07/17; the patient arrives today with a wound roughly the same size although under careful inspection under the light there appears to be some epithelialization. Her intake nurse noted that the Endoform seemed to be placed over the wound rather than in the deeper divots they could really benefit from the Endoform. At this point I have no plans to change the Endoform as at one point this was at least 33% exposed bone and the rest of the wound very close to the underlying phalanx 05/14/17; no major change in the size or appearance of the wound. We have been using Endoform for a prolonged period of time with reasonably good improvement in the epithelialization i.e. no exposed bone especially proximally. Unless we've not made a lot of  changes in the last several weeks. She does not complain of pain. She was revascularized early this year or perhaps late last year by Dr. Bridgett Larsson and I wonder if he will need to have a look at her, I will need to review the actual vascular procedure which I think was a distal popliteal bypass 05/21/17; switched to either for a blue last week. Patient arrived in clinic with the wound not measuring any better but the surface looking some better. Unfortunately she had some surface slough and when I went to remove this superiorly she had exposed bone. Previously she had had exposed bone in the inferior part of the wound however this is granulated over some  weeks ago. Clearly this is a major step back for her. With some difficulty I was able to obtain a piece of the bone probing through the superior part of the wound for culture. We did not send pathology. It is likely that she is going to need imaging of this site but I did not order this today 05/28/17; bone culture I took last week showed MSSA. She still has exposed bone however I could not get another piece for pathology. She had an MRI 4 months ago that did not show osteoarthritic at time. Wound rapidly deteriorated superiorly and I suspect this is underlying osteomyelitis. We have been using Hydrofera Blue 06/04/17 on evaluation today patient's wound appears to actually be doing a little bit better visually compared to last week's evaluation which is good news. Nonetheless she does have osteomyelitis of the toe which does not appear to be MRSA. No fevers, chills, nausea, or vomiting noted at this time. She is having no discomfort. 06/11/17; patient's wound looks a little healthier than I remember seeing it 2 weeks ago. There is no exposed bone and the surface of the wound appears well granulated. We've been using hydrofera blue. I note that she was started on doxycycline which was MSSA. Her appointment with Dr. Ola Spurr is on November 12 I believe.  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 Vallarie Mare of vascular surgery on 11/9. He offered right leg angiography possible intervention however the patient refused. I've discussed this with the patient's husband today she did not want any further procedures. Also noteworthy that Dr. Vallarie Mare stated that this wound had not healed in 3 years, I was not aware of this degree of chronicity in this area. She has underlying osteomyelitis by bone culture. Culture of this grew MSSA, she is allergic to penicillin and therefore not a candidate for beta lactams. She saw Dr. Ola Spurr of infectious disease this morning, he continued her on doxycycline for another month and apparently sent in a prescription. We have been using Hydrofera Blue. ABI's update by Dr. Bridgett Larsson right 0.62, left 0.89. Monophasic wave forms 06/25/17 on evaluation today patient appears to be doing well in regard to her right great toe wound. She is still taking the doxycycline as prescribed by Dr. Ola Spurr. The Hydrofera Blue Dressing's to be doing as well as anything has for her up to this point and we are still waiting on an insurance authorization for the [place]. She is having no pain which is good news. No fevers, chills, nausea, or vomiting noted at this time. 07/02/17; still taking doxycycline as prescribed by Dr. Ola Spurr. Hydrofera Blue continues. We have no palpable bone. Still have not had had approval of Apligraf 08/06/16; the patient arrives today with [facility] even though we apparently had changed to Sorbact last time. Her co-pay for Regranex is $389 in discussion with the patient and her husband this was excessive. We'll continue with the Sorbact and standard dressings for now 08/20/17; we have been using sorbact. The wound bed still requires debridement. Wound measurements are slightly better. 09/03/17;using Sorbact.no debridement today. Wound measurements slightly better. There is some  discoloration of the tip of her toelooks like bruising 09/17/17; using Sorbact. No debridement today. Wound dimensions are about the same. Still some discoloration at the tip of her great toe. This does not look any worse than last time Shimer, Aydee V. ([id number]) 10/01/17; using sorbact right great toe I thought she might have surface epithelialization last time although it is certainly  not look like that today nevertheless her dimensions are better. Continued surface eschar on the tip of her toe but this is not progressing. She is actually complaining of the left great toe 10/15/17; this is a very difficult area on the dorsal aspect of her proximal right great toe. At one point this wound had exposed bone however we have managed to get some degree of epithelialization. She was revascularized by Dr. Vallarie Mare in [place]. She saw Dr. Ola Spurr for underlying osteomyelitis. She is not a candidate for advanced treatment options [insurance issues]. She comes in today with a new area on the tip of her right great toe. The exact history here is unclear. We have been using sorbact 10/29/17; patient arrives today with very significant bilateral increase edema which appears to extend into the thighs. This is tight and not particularly pitting however it looks a lot more impressive than I remember. Not surprisingly the wounds on her right great toe have increased in size with weeping edema fluid as the edema extends into the dorsal foot itself. She also has a wound on the tip of her right great toe which is a new wound from 2 weeks ago. We have been using sorbact. 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 He is on Demadex presumably twice a day at 20 mg. I have asked her family to make her an appointment with her primary physicians at the [facility] to go over this degree of edema. This is causing I think deterioration in  the right great toe wound. She also has significant PAD and is status post right leg bypass graft. I think this was done by [facility]. I believe they also said that there was nothing further that could be done with regards to her vascular status. 11/12/17; patient is going to see her primary doctor this afternoon with regards to lower extremity edema. She has 2 open wounds on the right great toe the original wound on the dorsal proximal part and then a more necrotic looking area on the tip of her right great toe. We took some time today to review her vascular status. The patient had a relate below knee popliteal to peroneal artery bypass with reversed greater saphenous vein on 06/13/17; she also had endarterectomy of the midsegment of the peroneal artery. 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 I think at some point in time after that she was offered an arteriogram on the right but she declined. I've used Silver collagen to her wounds recently. 11/26/17; patient has 2 open wounds on the right great toe tip as well as the dorsal aspect of her right great toe. She has known PAD. I've been trying to get her back to see vein and vascular in [facility] to see if there are options for endovascular surgery to help with perfusion although the patient and her husband may not want any more surgery. We've been using silver alginate because of maceration. She does not have an option for advanced treatment/skin substitutes Objective Constitutional Patient is hypertensive.. Pulse regular and within target range for patient.[name] Respirations regular, non-labored and within target range.. Temperature is normal and within the target range for the patient.[name] appears in no distress. Vitals Time Taken: 10:31 AM, Height: 64 in, Weight: 200 lbs, BMI: 34.3, Temperature: 97.7  F,  Pulse: 81 bpm, Respiratory Rate: 18 breaths/min, Blood Pressure: 147/51 mmHg. General Notes: wound exam #1 original wound on the dorsal aspect of the right great toe about the same as last time. No debridement done. #2 necrotic surface over the tip of the right great toe removed with a #5 curet along with subcutaneous tissue. Paradoxically this looks quite good. Integumentary (Hair, Skin) Carey, Dashae V. ([id number]) Wound #11 status is Open. Original cause of wound was Gradually Appeared. The wound is located on the Right Toe Great. The wound measures 0.8cm length x 0.8cm width x 0.1cm depth; 0.503cm^2 area and 0.05cm^3 volume. There is no tunneling or undermining noted. There is a large amount of serosanguineous drainage noted. The wound margin is distinct with the outline attached to the wound base. There is small (1-33%) pink granulation within the wound bed. There is a large (67-100%) amount of necrotic tissue within the wound bed including Adherent Slough. The periwound skin appearance exhibited: Excoriation. The periwound skin appearance did not exhibit: Callus, Crepitus, Induration, Rash, Scarring, Dry/Scaly, Maceration, Atrophie Blanche, Cyanosis, Ecchymosis, Hemosiderin Staining, Mottled, Pallor, Rubor, Erythema. Periwound temperature was noted as No Abnormality. The periwound has tenderness on palpation. Wound #7 status is Open. Original cause of wound was Gradually Appeared. The wound is located on the Right,Dorsal Metatarsal head first. The wound measures 2.3cm length x 0.6cm width x 0.4cm depth; 1.084cm^2 area and 0.434cm^3 volume. There is Fat Layer (Subcutaneous Tissue) Exposed exposed. There is no tunneling or undermining noted. There is a large amount of serous drainage noted. The wound margin is flat and intact. There is medium (34-66%) granulation within the wound bed. There is a medium (34-66%) amount of necrotic tissue within the wound bed including Adherent Slough.  The periwound skin appearance exhibited: Induration, Maceration, Hemosiderin Staining. The periwound skin appearance did not exhibit: Callus, Crepitus, Excoriation, Rash, Scarring, Dry/Scaly, Atrophie Blanche, Cyanosis, Ecchymosis, Mottled, Pallor, Rubor, Erythema. Periwound temperature was noted as No Abnormality. The periwound has tenderness on palpation. 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 There was a Excisional Skin/Subcutaneous Tissue Debridement with a total area of 0.64 sq cm performed by Ricard Dillon, MD. With the following instrument(s): Curette. to remove Viable and Non-Viable tissue/material Material removed includes Eschar and Subcutaneous Tissue and after achieving pain control using Other (lidocaine 4%). No specimens were taken. A time out was conducted at 11:27, prior to the start of the procedure. A Minimum amount of bleeding was controlled with Pressure. The procedure was tolerated well with a pain level of 0 throughout and a pain level of 0 following the procedure. Post Debridement Measurements: 0.8cm length x 0.8cm width x 0.2cm depth; 0.101cm^3 volume. Character of Wound/Ulcer Post  Debridement requires further debridement. Severity of Tissue Post Debridement is: Fat layer exposed. Post procedure Diagnosis  Wound #11: Same as Pre-Procedure Hanf, Jamyiah V. ([id number]) Plan Wound Cleansing: Wound #11 Right Toe Great: Clean wound with Normal Saline. Cleanse wound with mild soap and water Anesthetic (add to Medication List): Wound #11 Right Toe Great: Topical Lidocaine 4% cream applied to wound bed prior to debridement (In Clinic Only). Wound #7 Right,Dorsal Metatarsal head first: Topical Lidocaine 4% cream applied to wound bed prior to debridement (In Clinic Only). Primary Wound Dressing: Wound #11 Right Toe Great: Silver Collagen Wound #7 Right,Dorsal Metatarsal head first: Silver Collagen Secondary Dressing: Wound #11 Right Toe Great: Conform/Kerlix Wound #7 Right,Dorsal Metatarsal head first: Conform/Kerlix Dressing Change Frequency: Wound #11 Right Toe Great: Change Dressing Monday, Wednesday, Friday Follow-up Appointments: Wound #11 Right Toe Great: Return Appointment in 2 weeks. Wound #7 Right,Dorsal Metatarsal head first: Return Appointment in 2 weeks. Edema Control: Wound #11 Right Toe Great: Elevate legs to the level of the heart and pump ankles as often as possible Wound #7 Right,Dorsal Metatarsal head first: Elevate legs to the level of the heart and pump ankles as often as possible Additional Orders / Instructions: Wound #11 Right Toe Great: Increase protein intake. Activity as tolerated Wound #7 Right,Dorsal Metatarsal head first: Increase protein intake. Activity as tolerated Home Health: Wound #11 Right Toe Great: Continue [facility] Health Visits - [facility] weekly, [facility] Continue [facility] Health Visits - [facility] twice weekly, [facility] Home Health Nurse may visit [facility] to address patient s wound care needs. Home Health Nurse may visit [facility] to address patient s wound care needs. FACE TO FACE  ENCOUNTER: MEDICARE and MEDICAID PATIENTS: I certify that this patient is under my care and that I had a face-to-face encounter that meets the physician face-to-face encounter requirements with this patient on this date. The encounter with the patient was in whole or in part for the following MEDICAL CONDITION: (primary reason for [place]) MEDICAL NECESSITY: I certify, that based on my findings, NURSING services are a medically necessary home health service. HOME BOUND STATUS: I certify that my clinical findings support that this patient is homebound (i.e., Due to illness or injury, pt requires aid of supportive devices such as crutches, cane, wheelchairs, walkers, the use of special transportation or the assistance of another person to leave their place of residence. There is a normal inability to leave the home and doing so requires considerable and taxing effort. Other absences are for medical reasons / religious services and are infrequent or of short duration when for other reasons). RONYA, GILCREST ([id number]) FACE TO FACE ENCOUNTER: MEDICARE and MEDICAID PATIENTS: I certify that this patient is under my care and that I had a face-to-face encounter that meets the physician face-to-face encounter requirements with this patient on this date. The encounter with the patient was in whole or in part for the following MEDICAL CONDITION: (primary reason for [place]) MEDICAL NECESSITY: I certify, that based on my findings, NURSING services are a medically necessary home health service. HOME BOUND STATUS: I certify that my clinical findings support that this patient is homebound (i.e., Due to illness or injury, pt requires aid of supportive devices such as crutches, cane, wheelchairs, walkers, the use of special transportation or the assistance of another person to leave their place of residence. There is a normal inability to leave the home and doing so requires considerable and taxing  effort. Other absences are for medical reasons / religious services and are infrequent or of short duration when for other reasons). If  current dressing causes regression in wound condition, may D/C ordered dressing product/s and apply Normal Saline Moist Dressing daily until next [place] / Other MD appointment. [place] of regression in wound condition at [phone]. If current dressing causes regression in wound condition, may D/C ordered dressing product/s and apply Normal Saline Moist Dressing daily until next [place] / Other MD appointment. [place] of regression in wound condition at [phone]. Please direct any NON-WOUND related issues/requests for orders to patient's Primary Care Physician Please direct any NON-WOUND related issues/requests for orders to patient's Primary Care Physician Wound #7 Right,Dorsal Metatarsal head first: [place] Visits - [facility] twice weekly, [facility] [place] Visits - [facility] twice weekly, [facility] Home Health Nurse may visit [facility] to address patient s wound care needs. Home Health Nurse may visit [facility] to address patient s wound care needs. FACE TO FACE ENCOUNTER: MEDICARE and MEDICAID PATIENTS: I certify that this patient is under my care and that I had a face-to-face encounter that meets the physician face-to-face encounter requirements with this patient on this date. The encounter with the patient was in whole or in part for the following MEDICAL CONDITION: (primary reason for [place]) MEDICAL NECESSITY: I certify, that based on my findings, NURSING services are a medically necessary home health service. HOME BOUND STATUS: I certify that my clinical findings support that this patient is homebound (i.e., Due to illness or injury, pt requires aid of supportive devices such as crutches, cane, wheelchairs, walkers, the use  of special transportation or the assistance of another person to leave their place of residence. There is a normal inability to leave the home and doing so requires considerable and taxing effort. Other absences are for medical reasons / religious services and are infrequent or of short duration when for other reasons). FACE TO FACE ENCOUNTER: MEDICARE and MEDICAID PATIENTS: I certify that this patient is under my care and that I had a face-to-face encounter that meets the physician face-to-face encounter requirements with this patient on this date. The encounter with the patient was in whole or in part for the following MEDICAL CONDITION: (primary reason for [place]) MEDICAL NECESSITY: I certify, that based on my findings, NURSING services are a medically necessary home health service. HOME BOUND STATUS: I certify that my clinical findings support that this patient is homebound (i.e., Due to illness or injury, pt requires aid of supportive devices such as crutches, cane, wheelchairs, walkers, the use of special transportation or the assistance of another person to leave their place of residence. There is a normal inability to leave the home and doing so requires considerable and taxing effort. Other absences are for medical reasons / religious services and are infrequent or of short duration when for other reasons). If current dressing causes regression in wound condition, may D/C ordered dressing product/s and apply Normal Saline Moist Dressing daily until next [place] / Other MD appointment. [place] of regression in wound condition at [phone]. If current dressing causes regression in wound condition, may D/C ordered dressing product/s and apply Normal Saline Moist Dressing daily until next [place] / Other MD appointment. [place] of regression in wound condition at [phone]. Please direct any NON-WOUND related  issues/requests for orders to patient's Primary Care Physician Please direct any NON-WOUND related issues/requests for orders to patient's Primary Care Physician Medications-please add to medication list.: Wound #11  Right Toe Great: P.O. Antibiotics - continue antibiotics as prescribed Wound #7 Right,Dorsal Metatarsal head first: P.O. Antibiotics - continue antibiotics as prescribed #1we are continuing silver collagen to both wound areas Mcguinness, Yetta V. ([id number]) #2 I'm trying to get an opinion from vein and vascular Silvestre Mesi whether anything can be done further to improve the blood flow to the foot although I understand that the patient simply does not want another major operation. Electronic Signature(s) Signed: 11/26/2017 5:40:08 PM By: Linton Ham MD Entered By: Linton Ham on 11/26/2017 13:30:36 Oshana, Magdalen [name] ([id number]) -------------------------------------------------------------------------------- SuperBill Details Patient Name: Trentham, Nga V. Date of Service: 11/26/2017 Medical Record Number: [id number] Patient Account Number: [id number] Date of Birth/Sex: March 31, 1941 (77 y.o. 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 I70.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,  right ankle and foot Facility Procedures CPT4 Code Description: [id number] 11042 - DEB SUBQ TISSUE 20 SQ CM/< ICD-10 Diagnosis Description L97.519 Non-pressure chronic ulcer of other part of right foot with unspe Modifier: cified severit Quantity: 1 y Physician Procedures CPT4 Code Description: [id number] 11042 - WC PHYS SUBQ TISS 20 SQ CM ICD-10 Diagnosis Description L97.519 Non-pressure chronic ulcer of other part of right foot with unspec Modifier: ified severit Quantity: 1 y Engineer, maintenance) Signed: 11/26/2017 5:40:08 PM By: Linton Ham MD Entered By: Linton Ham on 11/26/2017 13:31:09

## 2017-12-02 NOTE — Progress Notes (Signed)
[name] ([id number]) Visit Report for 11/26/2017 Arrival Information Details Patient Name: [name], [name] Date of Service: 11/26/2017 10:15 AM Medical Record Number: [id number] Patient Account Number: [id number] Date of Birth/Sex: [birth year] (77 y.o. F) Treating RN: Ahmed Prima Primary Care Daniya Aramburo: Beverlyn Roux Other Clinician: Referring Rawlins Stuard: Beverlyn Roux Treating Saleh Ulbrich/Extender: Tito Dine in Treatment: 51 Visit Information History Since Last Visit All ordered tests and consults were completed: No Patient Arrived: Wheel Chair Added or deleted any medications: No Arrival Time: 10:28 Any new allergies or adverse reactions: No Accompanied By: self Had a fall or experienced change in No Transfer Assistance: EasyPivot Patient activities of daily living that may affect Lift risk of falls: Patient Identification Verified: Yes Signs or symptoms of abuse/neglect since last visito No Secondary Verification Process Yes Hospitalized since last visit: No Completed: Implantable device outside of the clinic excluding No Patient Requires Transmission-Based No cellular tissue based products placed in the center Precautions: since last visit: Patient Has Alerts: No Has Dressing in Place as Prescribed: Yes Pain Present Now: No Electronic Signature(s) Signed: 11/28/2017 4:29:16 PM By: Alric Quan Entered By: Alric Quan on 11/26/2017 10:31:48 Chiasson, Jenean V. ([id number]) -------------------------------------------------------------------------------- Encounter Discharge Information Details Patient Name: [name], [name] Date of Service: 11/26/2017 10:15 AM Medical Record Number: [id number] Patient Account Number: [id number] Date of Birth/Sex: 1941-06-22 (77 y.o. F) Treating RN: Cornell Barman Primary Care Sharalee Witman: Beverlyn Roux Other Clinician: Referring Damarrion Mimbs: Beverlyn Roux Treating Avid Guillette/Extender: Tito Dine in Treatment: 73 Encounter  Discharge Information Items Discharge Pain Level: 0 Discharge Condition: Stable Ambulatory Status: Wheelchair Discharge Destination: Home Transportation: Private Auto Accompanied By: spouse Schedule Follow-up Appointment: Yes Medication Reconciliation completed and No provided to Patient/Care Rosy Estabrook: Provided on Clinical Summary of Care: 11/26/2017 Form Type Recipient Paper Patient [facility] Electronic Signature(s) Signed: 11/26/2017 12:50:37 PM By: Montey Hora Entered By: Montey Hora on 11/26/2017 12:50:37 Harbeson, [name] ([id number]) -------------------------------------------------------------------------------- Lower Extremity Assessment Details Patient Name: [name], [name] Date of Service: 11/26/2017 10:15 AM Medical Record Number: [id number] Patient Account Number: [id number] Date of Birth/Sex: 1940/12/29 (77 y.o. F) Treating RN: Ahmed Prima Primary Care Kashena Novitski: Beverlyn Roux Other Clinician: Referring Taryn Shellhammer: Beverlyn Roux Treating Abby Stines/Extender: Tito Dine in Treatment: 2 Vascular Assessment Pulses: Dorsalis Pedis Palpable: [Right:Yes] Posterior Tibial Extremity colors, hair growth, and conditions: Extremity Color: [Right:Hyperpigmented] Temperature of Extremity: [Right:Warm] Capillary Refill: [Right:< 3 seconds] Toe Nail Assessment Left: Right: Thick: Yes Discolored: Yes Deformed: Yes Improper Length and Hygiene: No Electronic Signature(s) Signed: 11/28/2017 4:29:16 PM By: Alric Quan Entered By: Alric Quan on 11/26/2017 10:44:51 [name], [name] ([id number]) -------------------------------------------------------------------------------- Multi Wound Chart Details Patient Name: [name], [name] Date of Service: 11/26/2017 10:15 AM Medical Record Number: [id number] Patient Account Number: [id number] Date of Birth/Sex: [birth year] (77 y.o. 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] [7:Lower Extremity] Comorbid History: [11:Cataracts, Chronic sinus problems/congestion, Congestive Heart Failure, Hypertension, Type II Diabetes Hypertension, Type II Diabetes] [7:Cataracts, Chronic sinus problems/congestion, Congestive Heart Failure,] [N/A:N/A] Date Acquired: [11:10/15/2017] [7:08/06/2013] [N/A:N/A] Weeks of Treatment: [11:6] [7:55] [N/A:N/A] Wound  Status: [11:Open] [7:Open] [N/A:N/A] Measurements L x W x D [11:0.8x0.8x0.1] [7:2.3x0.6x0.4] [N/A:N/A] (cm) Area (cm) : [11:0.503] [7:1.084] [N/A:N/A] Volume (cm) : [11:0.05] [7:0.434] [N/A:N/A] % Reduction in Area: [11:-326.30%] [7:8.00%] [N/A:N/A] % Reduction in Volume: [11:-316.70%] [7:-22.90%] [N/A:N/A] Classification: [11:Grade 2] [7:Grade 2] [N/A:N/A] Exudate Amount: [11:Large] [7:Large] [N/A:N/A] Exudate Type: [11:Serosanguineous] [7:Serous] [N/A:N/A] Exudate Color: [11:red, brown] [7:amber] [N/A:N/A] Wound Margin: [11:Distinct, outline attached] [7:Flat and Intact] [N/A:N/A] Granulation Amount: [11:Small (1-33%)] [7:Medium (34-66%)] [N/A:N/A] Granulation Quality: [11:Pink] [7:N/A] [N/A:N/A] Necrotic Amount: [11:Large (67-100%)] [7:Medium (34-66%)] [N/A:N/A] Exposed Structures: [11:Fascia: No Fat Layer (Subcutaneous Tissue) Exposed: No Tendon: No Muscle: No Joint: No Bone: No] [7:Fat Layer (Subcutaneous Tissue) Exposed: Yes Fascia: No Tendon: No Muscle: No  Joint: No Bone: No] [N/A:N/A] Epithelialization: [11:Small (1-33%)] [7:Small (1-33%)] [N/A:N/A] Debridement: [11:Debridement - Excisional 11:27] [7:N/A N/A] [N/A:N/A N/A] Pre-procedure Verification/Time Out Taken: Pain Control: Other N/A N/A Tissue Debrided: Necrotic/Eschar, N/A N/A Subcutaneous Level: Skin/Subcutaneous Tissue N/A N/A Debridement Area (sq cm): 0.64 N/A N/A Instrument: Curette N/A N/A Bleeding: Minimum N/A N/A Hemostasis Achieved: Pressure N/A N/A Procedural Pain: 0 N/A N/A Post Procedural Pain: 0 N/A N/A Debridement Treatment Procedure was tolerated well N/A N/A Response: Post Debridement 0.8x0.8x0.2 N/A N/A Measurements L x W x D (cm) Post Debridement Volume: 0.101 N/A N/A (cm) Periwound Skin Texture: Excoriation: Yes Induration: Yes N/A Induration: No Excoriation: No Callus: No Callus: No Crepitus: No Crepitus: No Rash: No Rash: No Scarring: No Scarring: No Periwound Skin Moisture: Maceration: No Maceration: Yes N/A Dry/Scaly: No Dry/Scaly: No Periwound Skin Color: Atrophie Blanche: No Hemosiderin Staining: Yes N/A Cyanosis: No Atrophie Blanche: No Ecchymosis: No Cyanosis: No Erythema: No Ecchymosis: No Hemosiderin Staining: No Erythema: No Mottled: No Mottled: No Pallor: No Pallor: No Rubor: No Rubor: No Temperature: No Abnormality No Abnormality N/A Tenderness on Palpation: Yes Yes N/A Wound Preparation: Ulcer Cleansing: Ulcer Cleansing: N/A Rinsed/Irrigated with Saline Rinsed/Irrigated with Saline Topical Anesthetic Applied: Topical Anesthetic Applied: Other: lidocaine 4% Other: lidocaine 4% Procedures Performed: Debridement N/A N/A Treatment Notes Wound #11 (Right Toe Great) 1. Cleansed with: Clean wound with Normal Saline 2. Anesthetic Topical Lidocaine 4% cream to wound bed prior to debridement 4. Dressing Applied: Prisma Ag 5. Secondary Dressing Applied ABD Pad Kerlix/Conform 7. Secured with Schofield, Nneka V.  ([id number]) Tape Notes darko shoe, heel cup Wound #7 (Right, Dorsal Metatarsal head first) 1. Cleansed with: Clean wound with Normal Saline 2. Anesthetic Topical Lidocaine 4% cream to wound bed prior to debridement 4. Dressing Applied: Prisma Ag 5. Secondary Dressing Applied ABD Pad Kerlix/Conform 7. Secured with Tape Notes darko shoe, heel cup Electronic Signature(s) Signed: 11/26/2017 5:40:08 PM By: Linton Ham MD Previous Signature: 11/26/2017 12:27:24 PM Version By: Gretta Cool, BSN, RN, CWS, Kim RN, BSN Entered By: Linton Ham on 11/26/2017 13:10:04 [name], [name] ([id number]) -------------------------------------------------------------------------------- Multi-Disciplinary Care Plan Details Patient Name: Longo, Fawnda V. Date of Service: 11/26/2017 10:15 AM Medical Record Number: [id number] Patient Account Number: [id number] Date of Birth/Sex: 18-Dec-1940 (77 y.o. F) Treating RN: Cornell Barman Primary Care Julis Haubner: Beverlyn Roux Other Clinician: Referring Twylah Bennetts: Beverlyn Roux Treating Yuriko Portales/Extender: Tito Dine in Treatment: 79 Active Inactive ` Abuse / Safety / Falls / Self Care Management Nursing Diagnoses: Impaired physical mobility Potential for falls Goals: Patient will remain injury free Date Initiated: 11/06/2016 Target Resolution Date: 12/30/2016 Goal Status: Active Patient/caregiver will verbalize understanding of skin care regimen Date Initiated: 11/06/2016 Target Resolution Date: 12/30/2016 Goal Status: Active Interventions: Assess fall risk on admission and as needed Treatment Activities: Patient referred  to home care : 11/06/2016 Notes: ` Nutrition Nursing Diagnoses: Potential for alteratiion in Nutrition/Potential for imbalanced nutrition Goals: Patient/caregiver verbalizes understanding of need to maintain therapeutic glucose control per primary care physician Date Initiated: 11/06/2016 Target Resolution Date: 12/30/2016 Goal Status:  Active Interventions: Provide education on elevated blood sugars and impact on wound healing Notes: ` Orientation to the Wound Care Program Nursing Diagnoses: Knowledge deficit related to the wound healing center program [name], [name] CLEAVER ([id number]) Goals: Patient/caregiver will verbalize understanding of the [place] Date Initiated: 11/06/2016 Target Resolution Date: 12/30/2016 Goal Status: Active Interventions: Provide education on orientation to the wound center Notes: ` Venous Leg Ulcer Nursing Diagnoses: Actual venous Insuffiency (use after diagnosis is confirmed) Knowledge deficit related to disease process and management Goals: Non-invasive venous studies are completed as ordered Date Initiated: 11/06/2016 Target Resolution Date: 12/30/2016 Goal Status: Active Patient/caregiver will verbalize understanding of disease process and disease management Date Initiated: 11/06/2016 Target Resolution Date: 12/30/2016 Goal Status: Active Interventions: Assess peripheral edema status every visit. Notes: ` Wound/Skin Impairment Nursing Diagnoses: Impaired tissue integrity Knowledge deficit related to smoking impact on wound healing Knowledge deficit related to ulceration/compromised skin integrity Goals: Ulcer/skin breakdown will heal within 14 weeks Date Initiated: 11/06/2016 Target Resolution Date: 02/05/2017 Goal Status: Active Interventions: Assess ulceration(s) every visit Treatment Activities: Skin care regimen initiated : 11/06/2016 Notes: Electronic Signature(s) Signed: 11/26/2017 12:27:24 PM By: Gretta Cool, BSN, RN, CWS, Kim RN, BSN [name], [name] ([id number]) Entered By: Gretta Cool, BSN, RN, CWS, Kim on 11/26/2017 11:28:43 Stanforth, [name] ([id number]) -------------------------------------------------------------------------------- Pain Assessment Details Patient Name: [name], [name] Date of Service: 11/26/2017 10:15 AM Medical Record Number: [id number] Patient  Account Number: [id number] Date of Birth/Sex: October 18, 1940 (77 y.o. F) Treating RN: Ahmed Prima Primary Care Markee Remlinger: Beverlyn Roux Other Clinician: Referring Maly Lemarr: Beverlyn Roux Treating Santanna Whitford/Extender: Tito Dine in Treatment: 31 Active Problems Location of Pain Severity and Description of Pain Patient Has Paino No Site Locations Pain Management and Medication Current Pain Management: Electronic Signature(s) Signed: 11/28/2017 4:29:16 PM By: Alric Quan Entered By: Alric Quan on 11/26/2017 10:31:53 Longley, Kember [name] ([id number]) -------------------------------------------------------------------------------- Patient/Caregiver Education Details Patient Name: Keiffer, Ines V. Date of Service: 11/26/2017 10:15 AM Medical Record Number: [id number] Patient Account Number: [id number] Date of Birth/Gender: [birth year] (77 y.o. F) Treating RN: Montey Hora Primary Care Physician: Beverlyn Roux Other Clinician: Referring Physician: Beverlyn Roux Treating Physician/Extender: Tito Dine in Treatment: 44 Education Assessment Education Provided To: Patient and Caregiver Education Topics Provided Wound/Skin Impairment: Handouts: Other: wound care as ordered Methods: Demonstration, Explain/Verbal Responses: State content correctly Electronic Signature(s) Signed: 11/26/2017 5:00:43 PM By: Montey Hora Entered By: Montey Hora on 11/26/2017 12:50:58 [name], [name] ([id number]) -------------------------------------------------------------------------------- Wound Assessment Details Patient Name: Verge, Estella V. Date of Service: 11/26/2017 10:15 AM Medical Record Number: [id number] Patient Account Number: [id number] Date of Birth/Sex: 1941/01/20 (77 y.o. F) Treating RN: Ahmed Prima Primary Care Ludivina Guymon: Beverlyn Roux Other Clinician: Referring Zeb Rawl: Beverlyn Roux Treating Kalai Baca/Extender: Tito Dine in Treatment: 27 Wound  Status Wound Number: 11 Primary Diabetic Wound/Ulcer of the Lower Extremity Etiology: Wound Location: Right Toe Great Wound Open Wounding Event: Gradually Appeared Status: Date Acquired: 10/15/2017 Comorbid Cataracts, Chronic sinus problems/congestion, Weeks Of Treatment: 6 History: Congestive Heart Failure, Hypertension, Type II Clustered Wound: No Diabetes Photos Photo Uploaded By: Roger Shelter on 11/26/2017 16:52:05 Wound Measurements Length: (cm) 0.8 Width: (cm) 0.8 Depth: (cm) 0.1 Area: (cm) 0.503 Volume: (cm) 0.05 % Reduction in Area: -326.3% % Reduction  in Volume: -316.7% Epithelialization: Small (1-33%) Tunneling: No Undermining: No Wound Description Classification: Grade 2 Wound Margin: Distinct, outline attached Exudate Amount: Large Exudate Type: Serosanguineous Exudate Color: red, brown Foul Odor After Cleansing: No Slough/Fibrino Yes Wound Bed Granulation Amount: Small (1-33%) Exposed Structure Granulation Quality: Pink Fascia Exposed: No Necrotic Amount: Large (67-100%) Fat Layer (Subcutaneous Tissue) Exposed: No Necrotic Quality: Adherent Slough Tendon Exposed: No Muscle Exposed: No Joint Exposed: No Bone Exposed: No Periwound Skin Texture [name], [name] ([id number]) Texture Color No Abnormalities Noted: No No Abnormalities Noted: No Callus: No Atrophie Blanche: No Crepitus: No Cyanosis: No Excoriation: Yes Ecchymosis: No Induration: No Erythema: No Rash: No Hemosiderin Staining: No Scarring: No Mottled: No Pallor: No Moisture Rubor: No No Abnormalities Noted: No Dry / Scaly: No Temperature / Pain Maceration: No Temperature: No Abnormality Tenderness on Palpation: Yes Wound Preparation Ulcer Cleansing: Rinsed/Irrigated with Saline Topical Anesthetic Applied: Other: lidocaine 4%, Treatment Notes Wound #11 (Right Toe Great) 1. Cleansed with: Clean wound with Normal Saline 2. Anesthetic Topical Lidocaine 4% cream to  wound bed prior to debridement 4. Dressing Applied: Prisma Ag 5. Secondary Dressing Applied ABD Pad Kerlix/Conform 7. Secured with Tape Notes darko shoe, heel cup Electronic Signature(s) Signed: 11/28/2017 4:29:16 PM By: Alric Quan Entered By: Alric Quan on 11/26/2017 10:41:33 [name], [name] ([id number]) -------------------------------------------------------------------------------- Wound Assessment Details Patient Name: [name], [name] Date of Service: 11/26/2017 10:15 AM Medical Record Number: [id number] Patient Account Number: [id number] Date of Birth/Sex: August 14, 1940 (77 y.o. F) Treating RN: Ahmed Prima Primary Care Chesley Valls: Beverlyn Roux Other Clinician: Referring Maha Fischel: Beverlyn Roux Treating Antonina Deziel/Extender: Tito Dine in Treatment: 66 Wound Status Wound Number: 7 Primary Diabetic Wound/Ulcer of the Lower Extremity Etiology: Wound Location: Right Metatarsal head first - Dorsal Wound Open Wounding Event: Gradually Appeared Status: Date Acquired: 08/06/2013 Comorbid Cataracts, Chronic sinus problems/congestion, Weeks Of Treatment: 55 History: Congestive Heart Failure, Hypertension, Type II Clustered Wound: No Diabetes Photos Photo Uploaded By: Roger Shelter on 11/26/2017 16:52:06 Wound Measurements Length: (cm) 2.3 Width: (cm) 0.6 Depth: (cm) 0.4 Area: (cm) 1.084 Volume: (cm) 0.434 % Reduction in Area: 8% % Reduction in Volume: -22.9% Epithelialization: Small (1-33%) Tunneling: No Undermining: No Wound Description Classification: Grade 2 Wound Margin: Flat and Intact Exudate Amount: Large Exudate Type: Serous Exudate Color: amber Foul Odor After Cleansing: No Slough/Fibrino Yes Wound Bed Granulation Amount: Medium (34-66%) Exposed Structure Necrotic Amount: Medium (34-66%) Fascia Exposed: No Necrotic Quality: Adherent Slough Fat Layer (Subcutaneous Tissue) Exposed: Yes Tendon Exposed: No Muscle Exposed: No Joint  Exposed: No Bone Exposed: No Periwound Skin Texture [name], [name] ([id number]) Texture Color No Abnormalities Noted: No No Abnormalities Noted: No Callus: No Atrophie Blanche: No Crepitus: No Cyanosis: No Excoriation: No Ecchymosis: No Induration: Yes Erythema: No Rash: No Hemosiderin Staining: Yes Scarring: No Mottled: No Pallor: No Moisture Rubor: No No Abnormalities Noted: No Dry / Scaly: No Temperature / Pain Maceration: Yes Temperature: No Abnormality Tenderness on Palpation: Yes Wound Preparation Ulcer Cleansing: Rinsed/Irrigated with Saline Topical Anesthetic Applied: Other: lidocaine 4%, Treatment Notes Wound #7 (Right, Dorsal Metatarsal head first) 1. Cleansed with: Clean wound with Normal Saline 2. Anesthetic Topical Lidocaine 4% cream to wound bed prior to debridement 4. Dressing Applied: Prisma Ag 5. Secondary Dressing Applied ABD Pad Kerlix/Conform 7. Secured with Tape Notes darko shoe, heel cup Electronic Signature(s) Signed: 11/28/2017 4:29:16 PM By: Alric Quan Entered By: Alric Quan on 11/26/2017 10:43:27 Warrior, Lewis V. ([id number]) -------------------------------------------------------------------------------- Vitals Details Patient Name: Masaki, Zahari V. Date of  Service: 11/26/2017 10:15 AM Medical Record Number: [id number] Patient Account Number: [id number] Date of Birth/Sex: 1941-03-03 (77 y.o. F) Treating RN: Ahmed Prima Primary Care Kayelyn Lemon: Beverlyn Roux Other Clinician: Referring Ryah Cribb: Beverlyn Roux Treating Martyna Thorns/Extender: Tito Dine in Treatment: 55 Vital Signs Time Taken: 10:31 Temperature (F): 97.7 Height (in): 64 Pulse (bpm): 81 Weight (lbs): 200 Respiratory Rate (breaths/min): 18 Body Mass Index (BMI): 34.3 Blood Pressure (mmHg): 147/51 Reference Range: 80 - 120 mg / dl Electronic Signature(s) Signed: 11/28/2017 4:29:16 PM By: Alric Quan Entered By: Alric Quan on  11/26/2017 10:32:53

## 2017-12-10 ENCOUNTER — Encounter: Payer: Medicare HMO | Attending: Internal Medicine | Admitting: Internal Medicine

## 2017-12-10 DIAGNOSIS — E11621 Type 2 diabetes mellitus with foot ulcer: Secondary | ICD-10-CM | POA: Insufficient documentation

## 2017-12-10 DIAGNOSIS — E1152 Type 2 diabetes mellitus with diabetic peripheral angiopathy with gangrene: Secondary | ICD-10-CM | POA: Insufficient documentation

## 2017-12-10 DIAGNOSIS — I70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 of foot: Secondary | ICD-10-CM | POA: Diagnosis not present

## 2017-12-10 DIAGNOSIS — L97519 Non-pressure chronic ulcer of other part of right foot with unspecified severity: Secondary | ICD-10-CM | POA: Insufficient documentation

## 2017-12-10 DIAGNOSIS — E11622 Type 2 diabetes mellitus with other skin ulcer: Secondary | ICD-10-CM | POA: Insufficient documentation

## 2017-12-10 DIAGNOSIS — M86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, right ankle and foot: Secondary | ICD-10-CM | POA: Insufficient documentation

## 2017-12-12 ENCOUNTER — Encounter: Payer: Self-pay | Admitting: Family

## 2017-12-12 ENCOUNTER — Ambulatory Visit (HOSPITAL_COMMUNITY)
Admission: RE | Admit: 2017-12-12 | Discharge: 2017-12-12 | Disposition: A | Discharge status: Home / Self Care | Payer: Medicare HMO | Source: Ambulatory Visit | Attending: Family | Admitting: Family

## 2017-12-12 ENCOUNTER — Ambulatory Visit: Payer: Medicare HMO | Admitting: Vascular Surgery

## 2017-12-12 ENCOUNTER — Other Ambulatory Visit: Payer: Self-pay

## 2017-12-12 ENCOUNTER — Ambulatory Visit: Payer: Medicare HMO | Admitting: Family

## 2017-12-12 VITALS — BP 138/76 | HR 82 | Temp 97.5°F | Resp 18 | Ht 64.0 in | Wt 223.0 lb

## 2017-12-12 DIAGNOSIS — I10 Essential (primary) hypertension: Secondary | ICD-10-CM | POA: Insufficient documentation

## 2017-12-12 DIAGNOSIS — E785 Hyperlipidemia, unspecified: Secondary | ICD-10-CM | POA: Diagnosis not present

## 2017-12-12 DIAGNOSIS — R600 Localized edema: Secondary | ICD-10-CM | POA: Diagnosis not present

## 2017-12-12 DIAGNOSIS — I998 Other disorder of circulatory system: Secondary | ICD-10-CM | POA: Insufficient documentation

## 2017-12-12 DIAGNOSIS — I63032 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: Secondary | ICD-10-CM

## 2017-12-12 DIAGNOSIS — Z95828 Presence of other vascular implants and grafts: Secondary | ICD-10-CM

## 2017-12-12 DIAGNOSIS — I7025 Atherosclerosis of native arteries of other extremities with ulceration: Secondary | ICD-10-CM

## 2017-12-12 DIAGNOSIS — R609 Edema, unspecified: Secondary | ICD-10-CM | POA: Diagnosis not present

## 2017-12-12 DIAGNOSIS — I70229 Atherosclerosis of native arteries of extremities with rest pain, unspecified extremity: Secondary | ICD-10-CM

## 2017-12-12 DIAGNOSIS — Z8673 Personal history of transient ischemic attack (TIA), and cerebral infarction without residual deficits: Secondary | ICD-10-CM | POA: Insufficient documentation

## 2017-12-12 NOTE — Patient Instructions (Addendum)
To decrease swelling in your feet and legs: Elevate feet above slightly bent knees, feet above heart, overnight and 3-4 times per day for 20 minutes.    Edema Edema is when you have too much fluid in your body or under your skin. Edema may make your legs, feet, and ankles swell up. Swelling is also common in looser tissues, like around your eyes. This is a common condition. It gets more common as you get older. There are many possible causes of edema. Eating too much salt (sodium) and being on your feet or sitting for a long time can cause edema in your legs, feet, and ankles. Hot weather may make edema worse. Edema is usually painless. Your skin may look swollen or shiny. Follow these instructions at home:  Keep the swollen body part raised (elevated) above the level of your heart when you are sitting or lying down.  Do not sit still or stand for a long time.  Do not wear tight clothes. Do not wear garters on your upper legs.  Exercise your legs. This can help the swelling go down.  Wear elastic bandages or support stockings as told by your doctor.  Eat a low-salt (low-sodium) diet to reduce fluid as told by your doctor.  Depending on the cause of your swelling, you may need to limit how much fluid you drink (fluid restriction).  Take over-the-counter and prescription medicines only as told by your doctor. Contact a doctor if:  Treatment is not working.  You have heart, liver, or kidney disease and have symptoms of edema.  You have sudden and unexplained weight gain. Get help right away if:  You have shortness of breath or chest pain.  You cannot breathe when you lie down.  You have pain, redness, or warmth in the swollen areas.  You have heart, liver, or kidney disease and get edema all of a sudden.  You have a fever and your symptoms get worse all of a sudden. Summary  Edema is when you have too much fluid in your body or under your skin.  Edema may make your legs,  feet, and ankles swell up. Swelling is also common in looser tissues, like around your eyes.  Raise (elevate) the swollen body part above the level of your heart when you are sitting or lying down.  Follow your doctor's instructions about diet and how much fluid you can drink (fluid restriction). This information is not intended to replace advice given to you by your health care provider. Make sure you discuss any questions you have with your health care provider. Document Released: 01/08/2008 Document Revised: 08/09/2016 Document Reviewed: 08/09/2016 Elsevier Interactive Patient Education  2017 [employer] Inc[employer]     Peripheral Vascular Disease Peripheral vascular disease (PVD) is a disease of the blood vessels that are not part of your heart and brain. A simple term for PVD is poor circulation. In most cases, PVD narrows the blood vessels that carry blood from your heart to the rest of your body. This can result in a decreased supply of blood to your arms, legs, and internal organs, like your stomach or kidneys. However, it most often affects a person's lower legs and feet. There are two types of PVD.  Organic PVD. This is the more common type. It is caused by damage to the structure of blood vessels.  Functional PVD. This is caused by conditions that make blood vessels contract and tighten (spasm).  Without treatment, PVD tends to get worse over   time. PVD can also lead to acute ischemic limb. This is when an arm or limb suddenly has trouble getting enough blood. This is a medical emergency. Follow these instructions at home:  Take medicines only as told by your doctor.  Do not use any tobacco products, including cigarettes, chewing tobacco, or electronic cigarettes. If you need help quitting, ask your doctor.  Lose weight if you are overweight, and maintain a healthy weight as told by your doctor.  Eat a diet that is low in fat and cholesterol. If you need help, ask your  doctor.  Exercise regularly. Ask your doctor for some good activities for you.  Take good care of your feet. ? Wear comfortable shoes that fit well. ? Check your feet often for any cuts or sores. Contact a doctor if:  You have cramps in your legs while walking.  You have leg pain when you are at rest.  You have coldness in a leg or foot.  Your skin changes.  You are unable to get or have an erection (erectile dysfunction).  You have cuts or sores on your feet that are not healing. Get help right away if:  Your arm or leg turns cold and blue.  Your arms or legs become red, warm, swollen, painful, or numb.  You have chest pain or trouble breathing.  You suddenly have weakness in your face, arm, or leg.  You become very confused or you cannot speak.  You suddenly have a very bad headache.  You suddenly cannot see. This information is not intended to replace advice given to you by your health care provider. Make sure you discuss any questions you have with your health care provider. Document Released: 10/16/2009 Document Revised: 12/28/2015 Document Reviewed: 12/30/2013 Elsevier Interactive Patient Education  2017 Elsevier Inc.  

## 2017-12-12 NOTE — Progress Notes (Signed)
[facility] [place]   CC: Follow up peripheral artery occlusive disease  History of Present Illness [name] is a 77 y.o. female patient of Dr.Chen who is s/p right below-the-knee popliteal artery to peroneal artery bypass with reversed greater saphenous vein, and endarterectomy of mid-segment peroneal artery on 02/28/16 by Dr. Bridgett Larsson.  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 The patient was in rehab after discharge from the hospital.  Dr. Bridgett Larsson last evaluated pt on 06-13-17. At that time the patient has had chronic wounds in the R foot for 3 months related to hardware removal.  This patient had been lost to follow up due to multiple bouts of illness requiring admission.   Family felt the right foot wound was healing.  Wound care was concerned that the wound was not healing.  R foot wound had NOT healed in 3 year, raising suspicions of residual hardware vs chronic infection.  Prior MRI demonstrated no osteomyelitis.  No hardware has been seen on previous imaging.  Dr. Bridgett Larsson offered R leg runoff, possible intervention.  Patient refused at that time. Given the severity of this patient's functional limitation, Dr. Bridgett Larsson did not think he would consider any further surgical intervention. Dr. Bridgett Larsson indicated that he also would not offer any additional surgery without angiography.  She attends [facility] wound care center to address slowly healing wound at base of right great toe, dorsal aspect.  Husband states HH dresses wound twice/week.  Pt states the wound "looks a whole lot better".  She reports intermittent pain at left great toe.  She denies fever or chills.   She states that she walks with her walker in her home.    Diabetic: Yes, husband states her last A1C was 9.2 Tobacco use: non-smoker  Pt meds include: Statin :Yes Betablocker: Yes ASA: Yes Other  anticoagulants/antiplatelets: Plavix   Past Medical History:  Diagnosis Date  . Acute heart failure ([place])   . Aortic stenosis   . Complication of anesthesia    Hard to wake patient up after having anesthesia  . Diabetes mellitus without complication ([place])   . Diabetic neuropathy ([facility])    Takes Lyrica  . Edema of both legs    Takes Lasix  . GERD (gastroesophageal reflux disease)   . High cholesterol   . HTN (hypertension)   . Hypertension   . PAD (peripheral artery disease) ([place])   . Shortness of breath dyspnea   . Spasm of back muscles   . Stroke ([place])   . Wound, open    Right great toe    Social History Social History   Tobacco Use  . Smoking status: Never Smoker  . Smokeless tobacco: Never Used  . Tobacco comment: Never smoked  Substance Use Topics  . Alcohol use: No    Alcohol/week: 0.0 oz  . Drug use: No    Family History Family History  Problem Relation Age of Onset  . Diabetes Other     Past Surgical History:  Procedure Laterality Date  . BYPASS GRAFT POPLITEAL TO TIBIAL Right 02/28/2016   Procedure: BYPASS GRAFT RIGHT BELOW KNEE POPLITEAL TO PERONEAL USING REVERSED RIGHT GREATER SAPHENOUS VEIN;  Surgeon: Conrad Stockett, MD;  Location: [place];  Service: Vascular;  Laterality: Right;  . CYST EXCISION     Abdomen  . EYE SURGERY Bilateral    Cataract removal  . INTRAMEDULLARY (  IM) NAIL INTERTROCHANTERIC Left 02/04/2014   Procedure: INTRAMEDULLARY (IM) NAIL INTERTROCHANTRIC FEMORAL;  Surgeon: Mauri Pole, MD;  Location: [place];  Service: Orthopedics;  Laterality: Left;  . IR GENERIC HISTORICAL  03/01/2016   IR ANGIO INTRA EXTRACRAN SEL COM CAROTID INNOMINATE UNI R MOD SED 03/01/2016 [name], MD [facility]  . IR GENERIC HISTORICAL  03/01/2016   IR ENDOVASC INTRACRANIAL INF OTHER THAN THROMBO ART INC DIAG ANGIO 03/01/2016 [name], MD [facility]  . IR GENERIC HISTORICAL  03/01/2016   IR INTRAVSC STENT CERV CAROTID W/O EMB-PROT MOD SED INC  ANGIO 03/01/2016 [name], MD [facility]  . IR GENERIC HISTORICAL  06/03/2016   IR RADIOLOGIST EVAL & MGMT 06/03/2016 [facility]  . ORIF TOE FRACTURE Right 02/08/2014   Procedure: OPEN REDUCTION INTERNAL FIXATION Right METATARSAL  FRACTURE ;  Surgeon: Wylene Simmer, MD;  Location: [place];  Service: Orthopedics;  Laterality: Right;  . PERIPHERAL VASCULAR CATHETERIZATION N/A 12/28/2015   Procedure: Abdominal Aortogram w/Lower Extremity;  Surgeon: Conrad [place], MD;  Location: [place] [facility];  Service: Cardiovascular;  Laterality: N/A;  . RADIOLOGY WITH ANESTHESIA N/A 03/01/2016   Procedure: RADIOLOGY WITH ANESTHESIA;  Surgeon: [name], MD;  Location: [place];  Service: Radiology;  Laterality: N/A;  . VEIN HARVEST Right 02/28/2016   Procedure: RIGHT GREATER SAPHENOUS VEIN HARVEST;  Surgeon: Conrad [place], MD;  Location: [place];  Service: Vascular;  Laterality: Right;    Allergies  Allergen Reactions  . Iodine Anaphylaxis  . Penicillins Anaphylaxis    Rec'd cefazolin 02/04/14 pre-op for femur ORIF  . Shellfish Allergy Anaphylaxis  . Shellfish-Derived Products Anaphylaxis  . Sulfa Antibiotics Anaphylaxis  . Sulfacetamide Sodium Anaphylaxis  . Sulfasalazine Anaphylaxis  . Atorvastatin     Other reaction(s): Unknown    Current Outpatient Medications  Medication Sig Dispense Refill  . acetaminophen (TYLENOL) 325 MG tablet Take 2 tablets (650 mg total) by mouth every 6 (six) hours as needed for moderate pain or fever. 30 tablet 1  . albuterol (PROVENTIL HFA;VENTOLIN HFA) 108 (90 Base) MCG/ACT inhaler Inhale 2 puffs into the lungs every 6 (six) hours as needed.    [name] amLODipine (NORVASC) 10 MG tablet Take 1 tablet (10 mg total) by mouth daily. 30 tablet 3  . aspirin 325 MG tablet Take 1 tablet (325 mg total) by mouth daily with breakfast. (Patient taking differently: Take 81 mg by mouth daily with breakfast. ) 30 tablet 0  . aspirin 325 MG tablet Take by mouth.    [name] atorvastatin  (LIPITOR) 20 MG tablet Take 1 tablet (20 mg total) by mouth daily. 30 tablet 11  . B-D ULTRAFINE III SHORT PEN 31G X 8 MM MISC     . carvedilol (COREG) 12.5 MG tablet Take 12.5 mg by mouth 2 (two) times daily with a meal.    . carvedilol (COREG) 12.5 MG tablet Take by mouth.    . carvedilol (COREG) 6.25 MG tablet     . cetirizine (ZYRTEC) 10 MG tablet Take 10 mg by mouth daily.     . cholecalciferol (VITAMIN D) 1000 units tablet Take 1,000 Units by mouth daily.    . Cholecalciferol (VITAMIN D3) 1000 units CAPS Take by mouth.    . clopidogrel (PLAVIX) 75 MG tablet Take 1 tablet (75 mg total) by mouth daily with breakfast. 30 tablet 0  . clopidogrel (PLAVIX) 75 MG tablet Take by mouth.    . collagenase (SANTYL) ointment Apply topically 2 (  two) times daily. Apply to dorsum right foot wound daily. 15 g 0  . docusate sodium (COLACE) 100 MG capsule Take 100 mg by mouth 2 (two) times daily.    [name] DOXYCYCLINE PO Take 100 mg 2 (two) times daily by mouth.    . fluticasone (FLONASE) 50 MCG/ACT nasal spray Place 1 spray into both nostrils daily.    . insulin aspart (NOVOLOG) 100 UNIT/ML injection Inject 0-15 Units into the skin 3 (three) times daily with meals. Before each meal 3 times a day, 140-199 - 3 units, 200-250 - 5 units, 251-299 - 8 units,  300-349 - 12 units,  350 or above 15 units. (Patient taking differently: Inject 12 Units into the skin 3 (three) times daily with meals. ) 10 mL 11  . insulin glargine (LANTUS) 100 UNIT/ML injection Inject 0.27 mLs (27 Units total) into the skin at bedtime. 10 mL 11  . isosorbide dinitrate (ISORDIL) 30 MG tablet     . isosorbide mononitrate (IMDUR) 60 MG 24 hr tablet Take 1 tablet (60 mg total) by mouth daily. 30 tablet 0  . LANTUS SOLOSTAR 100 UNIT/ML Solostar Pen     . losartan (COZAAR) 100 MG tablet Take 100 mg by mouth daily.    . metoprolol (LOPRESSOR) 50 MG tablet Take 50 mg by mouth 2 (two) times daily.    . metoprolol succinate (TOPROL-XL) 50 MG 24 hr  tablet Take 50 mg by mouth daily. Take with or immediately following a meal.    . nitroGLYCERIN (NITROSTAT) 0.4 MG SL tablet Place 1 tablet (0.4 mg total) under the tongue every 5 (five) minutes as needed for chest pain. 3 tablet 12  . omeprazole (PRILOSEC) 20 MG capsule Take 20 mg by mouth daily.    . polyethylene glycol (MIRALAX / GLYCOLAX) packet Take 17 g by mouth 2 (two) times daily. (Patient taking differently: Take 17 g by mouth daily as needed for mild constipation. ) 14 each 0  . polyethylene glycol powder (GLYCOLAX/MIRALAX) powder     . potassium chloride SA (K-DUR,KLOR-CON) 20 MEQ tablet Take 20 mEq by mouth daily.    . pregabalin (LYRICA) 50 MG capsule Take 50 mg by mouth 3 (three) times daily.    . pregabalin (LYRICA) 50 MG capsule Take by mouth.    . ranolazine (RANEXA) 500 MG 12 hr tablet Take 1 tablet (500 mg total) by mouth 2 (two) times daily. 60 tablet 0  . ranolazine (RANEXA) 500 MG 12 hr tablet Take by mouth.    . torsemide (DEMADEX) 20 MG tablet     . VITAMIN E PO Take 1 capsule by mouth daily.    . furosemide (LASIX) 40 MG tablet Take 1 tablet (40 mg total) by mouth 2 (two) times daily. 30 tablet 1   No current facility-administered medications for this visit.     ROS: See HPI for pertinent positives and negatives.   Physical Examination  Vitals:   12/12/17 1414  BP: 138/76  Pulse: 82  Resp: 18  Temp: (!) 97.5 F (36.4 C)  TempSrc: Oral  SpO2: 90%  Weight: 223 lb (101.2 kg)  Height: 5\' 4"  (1.626 m)   Body mass index is 38.28 kg/m.  General: A&O x 3, WDWN, obese female. Gait: seated in w/c Eyes: PERRLA. Pulmonary: Respirations are non labored, CTAB, fair air movement in all fields Cardiac: regular Rhythm    Abdominal aortic pulse is not palpable. Radial pulses: 2+ palpable  VASCULAR EXAM: Extremities with ischemic changes: ulcer at dorsal aspect, base of right great toe, signs of granulating at the edges and base of the  wound (see photo above), surrounding right foot tissue with dependent edema. Right tibial area ulcer has completely healed.  No wounds or gangrene on left leg or foot.  2+ pitting and non pitting dependent edema in both feet and lower legs.    Right foot    Right foot                                                                                                                                                         LE Pulses Right Left       FEMORAL  not palpable seated in w/c, obese  not palpable seated in w/c, obese        POPLITEAL  not palpable   not palpable       POSTERIOR TIBIAL  not palpable    not palpable        DORSALIS PEDIS      ANTERIOR TIBIAL Not palpable   Not palpable     Abdomen: soft, NT, no palpable masses, large panus. Skin: no rashes, see Extremities  Musculoskeletal: Mild weakness right upper and lower extremities, muscle strength 3/5; 5/5 in left upper and lower extremities          Skin: no rashes, no cellulitis, no ulcers noted. Neurologic: A&O X 3; appropriate affect, Sensation is normal; Speech is fluent/normal. CN 2-12 intact except seem to have some hearing loss. Psychiatric: Thought content is normal, mood appropriate for clinical situation.     ASSESSMENT: [name] is a 77 y.o. 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  Her atherosclerotic risk factors include uncontrolled DM, obesity, limited mobility, and heart failure. Fortunately she has never used tobacco. She takes a daily statin, ASA, and Plavix.    - Slowly healing right great toe ulcer, signs of granulation at the edges and in the wound bed: continue wound care management by [facility] wound care center, and [place]. Continue to walk with her walker as much as possible, if she has no  falling issues.   - dependent edema of both lower legs that is likely impeding wound healing: pt and husband instructed how to elevate her feet above her slightly flexed knees overnight and for 20 minutes 3-4x/day    DATA  Right LE Arterial Duplex (12/12/17): Patent CFA and proximal SFA with biphasic waveforms. Popliteal to peroneal artery bypass could not be identified.  No flow seen in the peroneal artery. Exam had to be done in her wheelchair,  pt refused to stand. Study is limited by edema, pain, limited mobility, and pt seated.    ABI (Date: 12/12/2017):  R:   ABI: 0.84 (was 0.62 on 06-13-17),   PT: mono  DP: mono  TBI:  0.62, toe pressure of 102  L:   ABI: 0.82 (was 0.89),   PT: mono  DP: mono   Pero: mono  TBI: 0.75, toe pressure of 123 Right ABI appears improved, left ABI remains about the same. All monophasic waveforms.  ABI's and TBI's are unreliable.  Exam had to be done in her wheelchair, pt refused to stand. Study is limited by edema, pain, limited mobility, and pt seated.     PLAN:  Based on the patient's vascular studies and examination, and after discussing with Dr. Donzetta Matters, pt and her husband are not prepared for pt to have another aortogram, as she expressed concern that she had a stroke after the arteriogram in 2017, and is afraid of the prospect of another stroke.  Pt and husband did agree to meet with Dr. Bridgett Larsson soon, bring one or two of their children with them to discuss and answer their questions.  Continue with wound care center and [facility] wound care.  Hydrogel dressing applied to right foot wound.  I discussed in depth with the patient the nature of atherosclerosis, and emphasized the importance of maximal medical management including strict control of blood pressure, blood glucose, and lipid levels, obtaining regular exercise, and continued cessation of smoking.  The patient is aware that without maximal medical management the underlying  atherosclerotic disease process will progress, limiting the benefit of any interventions.  The patient was given information about PAD including signs, symptoms, treatment, what symptoms should prompt the patient to seek immediate medical care, and risk reduction measures to take.  Clemon Chambers, RN, MSN, FNP-C [facility] Phone: [phone]  Clinic MD: Donzetta Matters on call  12/12/17 2:45 PM

## 2017-12-13 NOTE — Progress Notes (Signed)
KORRINA, ZERN ([id number]) Visit Report for 12/10/2017 Arrival Information Details Patient Name: [name], [name] Date of Service: 12/10/2017 10:15 AM Medical Record Number: [id number] Patient Account Number: [id number] Date of Birth/Sex: February 09, 1941 (77 y.o. F) Treating RN: Ahmed Prima Primary Care Marcella Charlson: Beverlyn Roux Other Clinician: Referring Chaselyn Nanney: Beverlyn Roux Treating Kavonte Bearse/Extender: Tito Dine in Treatment: 78 Visit Information History Since Last Visit All ordered tests and consults were completed: No Patient Arrived: Wheel Chair Added or deleted any medications: No Arrival Time: 10:16 Any new allergies or adverse reactions: No Accompanied By: husband Had a fall or experienced change in No Transfer Assistance: EasyPivot Patient activities of daily living that may affect Lift risk of falls: Patient Identification Verified: Yes Signs or symptoms of abuse/neglect since last visito No Secondary Verification Process Yes Hospitalized since last visit: No Completed: Implantable device outside of the clinic excluding No Patient Requires Transmission-Based No cellular tissue based products placed in the center Precautions: since last visit: Patient Has Alerts: No Has Dressing in Place as Prescribed: Yes Pain Present Now: No Electronic Signature(s) Signed: 12/10/2017 4:48:17 PM By: Alric Quan Entered By: Alric Quan on 12/10/2017 10:16:23 [name], [name] ([id number]) -------------------------------------------------------------------------------- Clinic Level of Care Assessment Details Patient Name: [name], [name] Date of Service: 12/10/2017 10:15 AM Medical Record Number: [id number] Patient Account Number: [id number] Date of Birth/Sex: [birth year] (77 y.o. F) Treating RN: Cornell Barman Primary Care Rachel Samples: Beverlyn Roux Other Clinician: Referring Kashonda Sarkisyan: Beverlyn Roux Treating Zorianna Taliaferro/Extender: Tito Dine in Treatment: 18 Clinic  Level of Care Assessment Items TOOL 4 Quantity Score []  - Use when only an EandM is performed on FOLLOW-UP visit 0 ASSESSMENTS - Nursing Assessment / Reassessment []  - Reassessment of Co-morbidities (includes updates in patient status) 0 X- 1 5 Reassessment of Adherence to Treatment Plan ASSESSMENTS - Wound and Skin Assessment / Reassessment []  - Simple Wound Assessment / Reassessment - one wound 0 X- 2 5 Complex Wound Assessment / Reassessment - multiple wounds []  - 0 Dermatologic / Skin Assessment (not related to wound area) ASSESSMENTS - Focused Assessment []  - Circumferential Edema Measurements - multi extremities 0 []  - 0 Nutritional Assessment / Counseling / Intervention []  - 0 Lower Extremity Assessment (monofilament, tuning fork, pulses) []  - 0 Peripheral Arterial Disease Assessment (using hand held doppler) ASSESSMENTS - Ostomy and/or Continence Assessment and Care []  - Incontinence Assessment and Management 0 []  - 0 Ostomy Care Assessment and Management (repouching, etc.) PROCESS - Coordination of Care []  - Simple Patient / Family Education for ongoing care 0 X- 1 20 Complex (extensive) Patient / Family Education for ongoing care []  - 0 Staff obtains Programmer, systems, Records, Test Results / Process Orders []  - 0 Staff telephones HHA, Nursing Homes / Clarify orders / etc []  - 0 Routine Transfer to another Facility (non-emergent condition) []  - 0 Routine Hospital Admission (non-emergent condition) []  - 0 New Admissions / Biomedical engineer / Ordering NPWT, Apligraf, etc. []  - 0 Emergency Hospital Admission (emergent condition) X- 1 10 Simple Discharge Coordination [name], [name] ([id number]) []  - 0 Complex (extensive) Discharge Coordination PROCESS - Special Needs []  - Pediatric / Minor Patient Management 0 []  - 0 Isolation Patient Management []  - 0 Hearing / Language / Visual special needs []  - 0 Assessment of Community assistance (transportation, D/C  planning, etc.) []  - 0 Additional assistance / Altered mentation []  - 0 Support Surface(s) Assessment (bed, cushion, seat, etc.) INTERVENTIONS - Wound Cleansing / Measurement []  - Simple Wound Cleansing - one  wound 0 X- 2 5 Complex Wound Cleansing - multiple wounds X- 1 5 Wound Imaging (photographs - any number of wounds) []  - 0 Wound Tracing (instead of photographs) []  - 0 Simple Wound Measurement - one wound X- 2 5 Complex Wound Measurement - multiple wounds INTERVENTIONS - Wound Dressings []  - Small Wound Dressing one or multiple wounds 0 X- 2 15 Medium Wound Dressing one or multiple wounds []  - 0 Large Wound Dressing one or multiple wounds []  - 0 Application of Medications - topical []  - 0 Application of Medications - injection INTERVENTIONS - Miscellaneous []  - External ear exam 0 []  - 0 Specimen Collection (cultures, biopsies, blood, body fluids, etc.) []  - 0 Specimen(s) / Culture(s) sent or taken to Lab for analysis []  - 0 Patient Transfer (multiple staff / Civil Service fast streamer / Similar devices) []  - 0 Simple Staple / Suture removal (25 or less) []  - 0 Complex Staple / Suture removal (26 or more) []  - 0 Hypo / Hyperglycemic Management (close monitor of Blood Glucose) []  - 0 Ankle / Brachial Index (ABI) - do not check if billed separately X- 1 5 Vital Signs [name], [name] ([id number]) Has the patient been seen at the hospital within the last three years: Yes Total Score: 105 Level Of Care: New/Established - Level 3 Electronic Signature(s) Signed: 12/10/2017 5:47:31 PM By: Gretta Cool, BSN, RN, CWS, Kim RN, BSN Entered By: Gretta Cool, BSN, RN, CWS, Kim on 12/10/2017 11:04:46 [name], [name] ([id number]) -------------------------------------------------------------------------------- Encounter Discharge Information Details Patient Name: [name], [name] Date of Service: 12/10/2017 10:15 AM Medical Record Number: [id number] Patient Account Number: [id number] Date of Birth/Sex:  05/06/1941 (77 y.o. F) Treating RN: Montey Hora Primary Care Sayed Apostol: Beverlyn Roux Other Clinician: Referring Kimba Lottes: Beverlyn Roux Treating Kameran Mcneese/Extender: Tito Dine in Treatment: 28 Encounter Discharge Information Items Discharge Condition: Stable Ambulatory Status: Wheelchair Discharge Destination: Home Transportation: Private Auto Accompanied By: spouse Schedule Follow-up Appointment: Yes Clinical Summary of Care: Electronic Signature(s) Signed: 12/11/2017 4:34:09 PM By: Montey Hora Entered By: Montey Hora on 12/10/2017 11:17:54 [name], [name] ([id number]) -------------------------------------------------------------------------------- Lower Extremity Assessment Details Patient Name: Raine, Sallyanne V. Date of Service: 12/10/2017 10:15 AM Medical Record Number: [id number] Patient Account Number: [id number] Date of Birth/Sex: Dec 16, 1940 (77 y.o. F) Treating RN: Ahmed Prima Primary Care Mystic Labo: Beverlyn Roux Other Clinician: Referring Odean Fester: Beverlyn Roux Treating Cougar Imel/Extender: Tito Dine in Treatment: 51 Vascular Assessment Pulses: Dorsalis Pedis Palpable: [Left:Yes] Posterior Tibial Extremity colors, hair growth, and conditions: Extremity Color: [Left:Hyperpigmented] Temperature of Extremity: [Left:Warm] Capillary Refill: [Left:< 3 seconds] Toe Nail Assessment Left: Right: Thick: Yes Discolored: Yes Deformed: Yes Improper Length and Hygiene: Yes Electronic Signature(s) Signed: 12/10/2017 4:48:17 PM By: Alric Quan Entered By: Alric Quan on 12/10/2017 10:30:42 [name], [name] ([id number]) -------------------------------------------------------------------------------- Multi Wound Chart Details Patient Name: Snelgrove, Allannah V. Date of Service: 12/10/2017 10:15 AM Medical Record Number: [id number] Patient Account Number: [id number] Date of Birth/Sex: 1941-05-08 (77 y.o. 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] [7:Lower Extremity] Comorbid History: [11:Cataracts, Chronic sinus problems/congestion, Congestive Heart  Failure, Hypertension, Type II Diabetes Hypertension, Type II Diabetes] [7:Cataracts, Chronic sinus problems/congestion, Congestive Heart Failure,] [N/A:N/A] Date Acquired: [11:10/15/2017] [7:08/06/2013] [N/A:N/A] Weeks of Treatment: [11:8] [7:57] [N/A:N/A] Wound Status: [11:Open] [7:Open] [N/A:N/A] Measurements L x W x D [11:2.4x0.9x0.3] [7:0.8x0.7x0.2] [N/A:N/A] (cm) Area (cm) : [11:1.696] [7:0.44] [N/A:N/A] Volume (cm) : [11:0.509] [7:0.088] [N/A:N/A] % Reduction in Area: [11:-1337.30%] [7:62.60%] [N/A:N/A] % Reduction in Volume: [11:-4141.70%] [7:75.10%] [N/A:N/A] Classification: [11:Grade 2] [7:Grade 2] [N/A:N/A] Exudate Amount: [11:Large] [7:Large] [N/A:N/A] Exudate Type: [11:Serosanguineous] [7:Serosanguineous] [N/A:N/A] Exudate Color: [11:red, brown] [7:red, brown] [N/A:N/A] Wound Margin: [11:Distinct, outline attached] [7:Flat and Intact] [N/A:N/A] Granulation Amount: [11:Small (1-33%)] [7:Large (67-100%)] [N/A:N/A] Granulation Quality: [11:Pink] [7:Red] [N/A:N/A] Necrotic Amount: [11:Large (67-100%)] [7:Small (1-33%)] [N/A:N/A] Exposed Structures: [11:Fascia: No Fat Layer (Subcutaneous Tissue) Exposed: No Tendon: No Muscle: No Joint: No Bone: No] [7:Fat Layer (Subcutaneous  Tissue) Exposed: Yes Fascia: No Tendon: No Muscle: No Joint: No Bone: No] [N/A:N/A] Epithelialization: [11:Small (1-33%)] [7:Small (1-33%)] [N/A:N/A] Periwound Skin Texture: [11:Excoriation: Yes Induration: No] [7:Induration: Yes Excoriation: No] [N/A:N/A] Callus: No Callus: No Crepitus: No Crepitus: No Rash: No Rash: No Scarring: No Scarring: No Periwound Skin Moisture: Maceration: No Maceration: No N/A Dry/Scaly: No Dry/Scaly: No Periwound Skin Color: Atrophie Blanche: No Hemosiderin Staining: Yes N/A Cyanosis: No Atrophie Blanche: No Ecchymosis: No Cyanosis: No Erythema: No Ecchymosis: No Hemosiderin Staining: No Erythema: No Mottled: No Mottled: No Pallor: No Pallor: No Rubor: No Rubor: No Temperature: No Abnormality No Abnormality N/A Tenderness on Palpation: Yes Yes N/A Wound Preparation: Ulcer Cleansing: Ulcer Cleansing: N/A Rinsed/Irrigated with Saline Rinsed/Irrigated with Saline Topical Anesthetic Applied: Topical Anesthetic Applied: Other: lidocaine 4% Other: lidocaine 4% Treatment Notes Wound #11 (Right Toe Great) 1. Cleansed with: Clean wound with Normal Saline 2. Anesthetic Topical Lidocaine 4% cream to wound bed prior to debridement 4. Dressing Applied: Prisma Ag 5. Secondary Dressing Applied ABD Pad Kerlix/Conform 7. Secured with Tape Notes darko shoe, heel cup Wound #7 (Right, Dorsal Metatarsal head first) 1. Cleansed with: Clean wound with Normal Saline 2. Anesthetic Topical Lidocaine 4% cream to wound bed prior to debridement 4. Dressing Applied: Prisma Ag 5. Secondary Dressing Applied ABD Pad Kerlix/Conform 7. Secured with Tape Notes darko shoe, heel cup [name], [name] ([id number]) Electronic Signature(s) Signed: 12/11/2017 1:16:01 PM By: Linton Ham MD Entered By: Linton Ham on 12/10/2017 12:53:39 [name], [name]  ([id number]) -------------------------------------------------------------------------------- Multi-Disciplinary Care Plan Details Patient Name: [name], [name] Date of Service: 12/10/2017 10:15 AM Medical Record Number: [id number] Patient Account Number: [id number] Date of Birth/Sex: Dec 01, 1940 (77 y.o. F) Treating RN: Cornell Barman Primary Care Shelbie Franken: Beverlyn Roux Other Clinician: Referring Dorse Locy: Beverlyn Roux Treating Antario Yasuda/Extender: Tito Dine in Treatment: 22 Active Inactive ` Abuse / Safety / Falls / Self Care Management Nursing Diagnoses: Impaired physical mobility Potential for falls Goals: Patient will remain injury free Date Initiated: 11/06/2016 Target Resolution Date: 12/30/2016 Goal Status: Active Patient/caregiver will verbalize understanding of skin care regimen Date Initiated: 11/06/2016 Target Resolution Date: 12/30/2016 Goal Status: Active Interventions: Assess fall risk on admission and as needed Treatment Activities: Patient referred to home care : 11/06/2016 Notes: ` Nutrition Nursing Diagnoses: Potential for alteratiion in Nutrition/Potential for imbalanced nutrition Goals: Patient/caregiver verbalizes understanding of need to maintain therapeutic glucose control per primary care physician Date Initiated: 11/06/2016 Target Resolution Date: 12/30/2016 Goal Status: Active Interventions: Provide education on elevated blood sugars and impact on wound healing Notes: ` Orientation to the Wound Care Program Nursing Diagnoses: Knowledge deficit related to the wound healing center program [name], [name] V. (  [id number]) Goals: Patient/caregiver will verbalize understanding of the [place] Date Initiated: 11/06/2016 Target Resolution Date: 12/30/2016 Goal Status: Active Interventions: Provide education on orientation to the wound center Notes: ` Venous Leg Ulcer Nursing Diagnoses: Actual venous Insuffiency (use after diagnosis is  confirmed) Knowledge deficit related to disease process and management Goals: Non-invasive venous studies are completed as ordered Date Initiated: 11/06/2016 Target Resolution Date: 12/30/2016 Goal Status: Active Patient/caregiver will verbalize understanding of disease process and disease management Date Initiated: 11/06/2016 Target Resolution Date: 12/30/2016 Goal Status: Active Interventions: Assess peripheral edema status every visit. Notes: ` Wound/Skin Impairment Nursing Diagnoses: Impaired tissue integrity Knowledge deficit related to smoking impact on wound healing Knowledge deficit related to ulceration/compromised skin integrity Goals: Ulcer/skin breakdown will heal within 14 weeks Date Initiated: 11/06/2016 Target Resolution Date: 02/05/2017 Goal Status: Active Interventions: Assess ulceration(s) every visit Treatment Activities: Skin care regimen initiated : 11/06/2016 Notes: Electronic Signature(s) Signed: 12/10/2017 5:47:31 PM By: Gretta Cool, BSN, RN, CWS, Kim RN, BSN Beagley, Kloey V. ([id number]) Entered By: Gretta Cool, BSN, RN, CWS, Kim on 12/10/2017 10:58:31 Jefferys, Mylena [name] ([id number]) -------------------------------------------------------------------------------- Pain Assessment Details Patient Name: Talbot, Frostproof Date of Service: 12/10/2017 10:15 AM Medical Record Number: [id number] Patient Account Number: [id number] Date of Birth/Sex: Feb 12, 1941 (77 y.o. F) Treating RN: Ahmed Prima Primary Care Guss Farruggia: Beverlyn Roux Other Clinician: Referring Geraldyn Shain: Beverlyn Roux Treating Shalev Helminiak/Extender: Tito Dine in Treatment: 31 Active Problems Location of Pain Severity and Description of Pain Patient Has Paino No Site Locations Pain Management and Medication Current Pain Management: Electronic Signature(s) Signed: 12/10/2017 4:48:17 PM By: Alric Quan Entered By: Alric Quan on 12/10/2017 10:16:30 [name], [name]  ([id number]) -------------------------------------------------------------------------------- Patient/Caregiver Education Details Patient Name: Juneau, Marijean V. Date of Service: 12/10/2017 10:15 AM Medical Record Number: [id number] Patient Account Number: [id number] Date of Birth/Gender: Sep 24, 1940 (77 y.o. F) Treating RN: Montey Hora Primary Care Physician: Beverlyn Roux Other Clinician: Referring Physician: Beverlyn Roux Treating Physician/Extender: Tito Dine in Treatment: 18 Education Assessment Education Provided To: Patient and Caregiver Education Topics Provided Wound/Skin Impairment: Handouts: Other: wound care as ordered Methods: Explain/Verbal Responses: State content correctly Electronic Signature(s) Signed: 12/11/2017 4:34:09 PM By: Montey Hora Entered By: Montey Hora on 12/10/2017 11:18:40 Bergthold, Novalynn V. ([id number]) -------------------------------------------------------------------------------- Wound Assessment Details Patient Name: Minium, Ivette V. Date of Service: 12/10/2017 10:15 AM Medical Record Number: [id number] Patient Account Number: [id number] Date of Birth/Sex: 05-22-1941 (77 y.o. F) Treating RN: Ahmed Prima Primary Care Jodie Leiner: Beverlyn Roux Other Clinician: Referring Yamilee Harmes: Beverlyn Roux Treating Ziggy Chanthavong/Extender: Tito Dine in Treatment: 40 Wound Status Wound Number: 11 Primary Diabetic Wound/Ulcer of the Lower Extremity Etiology: Wound Location: Right Toe Great Wound Open Wounding Event: Gradually Appeared Status: Date Acquired: 10/15/2017 Comorbid Cataracts, Chronic sinus problems/congestion, Weeks Of Treatment: 8 History: Congestive Heart Failure, Hypertension, Type II Clustered Wound: No Diabetes Photos Photo Uploaded By: Gretta Cool, BSN, RN, CWS, Kim on 12/11/2017 15:45:11 Wound Measurements Length: (cm) 2.4 Width: (cm) 0.9 Depth: (cm) 0.3 Area: (cm) 1.696 Volume: (cm) 0.509 % Reduction in Area:  -1337.3% % Reduction in Volume: -4141.7% Epithelialization: Small (1-33%) Tunneling: No Undermining: No Wound Description Classification: Grade 2 Foul Odo Wound Margin: Distinct, outline attached Slough/F Exudate Amount: Large Exudate Type: Serosanguineous Exudate Color: red, brown r After Cleansing: No ibrino Yes Wound Bed Granulation Amount: Small (1-33%) Exposed Structure Granulation Quality: Pink Fascia Exposed: No Necrotic Amount: Large (67-100%) Fat Layer (Subcutaneous Tissue) Exposed: No Necrotic Quality: Adherent Slough Tendon Exposed: No Muscle Exposed: No Joint Exposed:  No Bone Exposed: No Periwound Skin Texture Cuello, Jini V. ([id number]) Texture Color No Abnormalities Noted: No No Abnormalities Noted: No Callus: No Atrophie Blanche: No Crepitus: No Cyanosis: No Excoriation: Yes Ecchymosis: No Induration: No Erythema: No Rash: No Hemosiderin Staining: No Scarring: No Mottled: No Pallor: No Moisture Rubor: No No Abnormalities Noted: No Dry / Scaly: No Temperature / Pain Maceration: No Temperature: No Abnormality Tenderness on Palpation: Yes Wound Preparation Ulcer Cleansing: Rinsed/Irrigated with Saline Topical Anesthetic Applied: Other: lidocaine 4%, Treatment Notes Wound #11 (Right Toe Great) 1. Cleansed with: Clean wound with Normal Saline 2. Anesthetic Topical Lidocaine 4% cream to wound bed prior to debridement 4. Dressing Applied: Prisma Ag 5. Secondary Dressing Applied ABD Pad Kerlix/Conform 7. Secured with Tape Notes darko shoe, heel cup Electronic Signature(s) Signed: 12/10/2017 4:48:17 PM By: Alric Quan Entered By: Alric Quan on 12/10/2017 10:26:53 Comley, Yennifer V. ([id number]) -------------------------------------------------------------------------------- Wound Assessment Details Patient Name: Archambault, Offie V. Date of Service: 12/10/2017 10:15 AM Medical Record Number: [id number] Patient Account Number:  [id number] Date of Birth/Sex: [birth year] (77 y.o. F) Treating RN: Ahmed Prima Primary Care Isra Lindy: Beverlyn Roux Other Clinician: Referring Deija Buhrman: Beverlyn Roux Treating Lashun Mccants/Extender: Tito Dine in Treatment: 62 Wound Status Wound Number: 7 Primary Diabetic Wound/Ulcer of the Lower Extremity Etiology: Wound Location: Right Metatarsal head first - Dorsal Wound Open Wounding Event: Gradually Appeared Status: Date Acquired: 08/06/2013 Comorbid Cataracts, Chronic sinus problems/congestion, Weeks Of Treatment: 57 History: Congestive Heart Failure, Hypertension, Type II Clustered Wound: No Diabetes Photos Photo Uploaded By: Gretta Cool, BSN, RN, CWS, Kim on 12/11/2017 15:45:12 Wound Measurements Length: (cm) 0.8 Width: (cm) 0.7 Depth: (cm) 0.2 Area: (cm) 0.44 Volume: (cm) 0.088 % Reduction in Area: 62.6% % Reduction in Volume: 75.1% Epithelialization: Small (1-33%) Tunneling: No Undermining: No Wound Description Classification: Grade 2 Wound Margin: Flat and Intact Exudate Amount: Large Exudate Type: Serosanguineous Exudate Color: red, brown Foul Odor After Cleansing: No Slough/Fibrino Yes Wound Bed Granulation Amount: Large (67-100%) Exposed Structure Granulation Quality: Red Fascia Exposed: No Necrotic Amount: Small (1-33%) Fat Layer (Subcutaneous Tissue) Exposed: Yes Necrotic Quality: Adherent Slough Tendon Exposed: No Muscle Exposed: No Joint Exposed: No Bone Exposed: No Periwound Skin Texture Helvey, Artasia V. ([id number]) Texture Color No Abnormalities Noted: No No Abnormalities Noted: No Callus: No Atrophie Blanche: No Crepitus: No Cyanosis: No Excoriation: No Ecchymosis: No Induration: Yes Erythema: No Rash: No Hemosiderin Staining: Yes Scarring: No Mottled: No Pallor: No Moisture Rubor: No No Abnormalities Noted: No Dry / Scaly: No Temperature / Pain Maceration: No Temperature: No Abnormality Tenderness on Palpation:  Yes Wound Preparation Ulcer Cleansing: Rinsed/Irrigated with Saline Topical Anesthetic Applied: Other: lidocaine 4%, Treatment Notes Wound #7 (Right, Dorsal Metatarsal head first) 1. Cleansed with: Clean wound with Normal Saline 2. Anesthetic Topical Lidocaine 4% cream to wound bed prior to debridement 4. Dressing Applied: Prisma Ag 5. Secondary Dressing Applied ABD Pad Kerlix/Conform 7. Secured with Tape Notes darko shoe, heel cup Electronic Signature(s) Signed: 12/10/2017 4:48:17 PM By: Alric Quan Entered By: Alric Quan on 12/10/2017 10:27:27 Kupper, Miasha V. ([id number]) -------------------------------------------------------------------------------- Vitals Details Patient Name: Nelon, Pollyanna V. Date of Service: 12/10/2017 10:15 AM Medical Record Number: [id number] Patient Account Number: [id number] Date of Birth/Sex: [birth year] (77 y.o. F) Treating RN: Ahmed Prima Primary Care Jannis Atkins: Beverlyn Roux Other Clinician: Referring Nivek Powley: Beverlyn Roux Treating Fajr Fife/Extender: Tito Dine in Treatment: 75 Vital Signs Time Taken: 10:16 Temperature (F): 97.9 Height (in): 64 Pulse (bpm): 77 Weight (lbs): 200 Respiratory Rate (breaths/min): 18 Body Mass  Index (BMI): 34.3 Blood Pressure (mmHg): 138/51 Reference Range: 80 - 120 mg / dl Electronic Signature(s) Signed: 12/10/2017 4:48:17 PM By: Alric Quan Entered By: Alric Quan on 12/10/2017 10:17:28

## 2017-12-13 NOTE — Progress Notes (Signed)
[name], [name] ([id number]) Visit Report for 12/10/2017 HPI Details Patient Name: [name], [name] Date of Service: 12/10/2017 10:15 AM Medical Record Number: [id number] Patient Account Number: [id number] Date of Birth/Sex: 12/09/1940 (77 y.o. F) Treating RN: Cornell Barman Primary Care Provider: Beverlyn Roux Other Clinician: Referring Provider: Beverlyn Roux Treating Provider/Extender: Tito Dine in Treatment: 68 History of Present Illness HPI Description: 08/13/16: This is a 77 year old woman who came predominantly for review of 3 cm in diameter circular wound to the left anterior lateral leg. She was in the ER on 08/01/16 I reviewed their notes. There was apparently pus coming out of the wound at that time and the patient arrived requesting debridement which they don't do in the emergency room. Nevertheless I can't see that they did any x-rays. There were no cultures done. She is a type II diabetic and I a note after the patient was in the clinic that she had a bypass graft from the popliteal to the tibial on the right on 02/28/16. She also had a right greater saphenous vein harvest on the same date for arterial bypass. She is going to have vascular studies including ABIs T ABIs on the right on 08/28/16. The patient's surgery was on 02/28/16 by Dr. Vallarie Mare she had a right below the knee popliteal artery to peroneal artery bypass with reverse greater saphenous vein and an endarterectomy of the mid segment peroneal artery. Postoperatively she had a strong mild monophasic peroneal signal with a pink foot. It would appear that the patient is had some nonhealing in the surgical saphenous vein harvest site on the left leg. Surprisingly looking through [facility] I cannot see much information about this at all. Dr. Lucious Groves notes from 05/29/16 show that the patient's wounds "are not healed" the right first metatarsal wound healed but then opened back up. 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 At that point the patient had a wound VAC to her right calf with regards to the wounds on her dorsal right toe would appear that these are felt to be arterial wounds. She has had surgery on the metatarsal phalangeal in 2015 I Dr. Doran Durand secondary to a right metatarsal phalangeal joint fracture. She is apparently had discoloration around this area since then. 08/28/16; patient arrives with her wounds in much the same condition. The linear vein harvest site and the circular wound below it which I think was a blister. She also has to probing holes in her right great toe and a necrotic eschar on the right second toe. Because of these being arterial wounds I reduced her compression from 3-2 layers this seems to of done satisfactorily she has not had any problems. I cannot see that she is actually had an x-ray ====== 11/06/16 the patient comes in for evaluation of her right lower extremity ulcers. She was here in January 2018 for 2 visits subsequently ended up in the hospital with pneumonia and then to rehabilitation. She has now been discharged from rehabilitation and is home. She has multiple ulcerations to her right lower extremity including the foot and toes. She does have home health in place and they have been placing alginate to the ulcers. She is followed by Dr. Bridgett Larsson of vascular medicine. 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 She recently saw him on 3/23. In office ABIs were:  Right 0.48 with monophasic flow to the DP, PT, peroneal Left 0.63 with monophasic flow to the DP and PT Her arterial studies indicated a patent right below knee popliteal to peroneal bypass She had an MRI in February 2008 that was negative frosty myelitis but this showed general soft tissue edema in the right foot and lower extremity concerning  for cellulitis She is a diabetic, managed with insulin. Her hemoglobin A1c in December 2017 was 8.4 which is a trend up from previous levels. She had blood work in February 2018 which revealed an albumin of 2.6 this appears to be relatively acute as an albumin in November 2017 was 3.7 11/13/16; this is a patient I have not seen since February who is readmitted to our clinic last week. 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 Bridgett Larsson. I have reviewed Dr. Lianne Moris notes from March/23/18. Doppler ABI on that date showed an ABI on the right of 0.48 and on the left of 0.63. Dorsalis pedis waveforms [name], [name] ([id number]) were monophasic bilaterally. There was no waveforms detected at the posterior tibial on the right, monophasic on the left. Dr. Lianne Moris comments were that this patient would have follow-up vascular studies in 3 months including ABIs and right lower extremity arterial duplex. She had an MRI in February that was negative for osteomyelitis but showed generalized edema in the foot. Last albumin I see was in January at 3.4 we have been using Santyl to the 4 wounds on the right leg the patient is noted today to have widespread edema well up towards her groin this is pitting 2-3+. 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 Normal ejection fraction. 11/20/16; patient has a follow-up appointment with Dr. Bridgett Larsson on April 23. She is still complaining of a lot of pain in the right foot and right leg. It is not clear to me that this is at all positional however I think it is clear claudication with minimal activity perhaps at rest. At our suggestion she is return to her primary physician's office tomorrow with regards to her pitting lower extremity bilateral edema that I reviewed in detail last week 11/27/16; the follow-up with Dr. Bridgett Larsson was actually on May 23 on April 23 as I  stated in my note last week. N/A case all of her wounds seems somewhat smaller. The 2 on the right leg are definitely smaller. The areas on the dorsal right first toe, right third toe and the lateral part of the right fifth metatarsal head all looks smaller but have tightly adherent surfaces. We have been using Santyl 12/04/16; follow-up with Dr. Bridgett Larsson on May 23. 2 small open wounds on the right leg continue to get smaller. The area on the right third total lateral aspect of the right fifth toe also look better. The remaining area on the dorsal first toe still has some depth to it. We have been using Santyl to the toes and collagen on the right 12/11/16; according to patient's husband the follow-up with Dr. Bridgett Larsson is not until July. All of the wounds on the right leg are measuring smaller. We have been using some combination of Prisma and Santyl although I think we can go to straight Prisma today. There may have been some confusion with home health about the primary dressing orders here. 12/25/16; the patient has had some healing this week. The area on her right  lateral fifth metatarsal head, right third toe are both healed lower right leg is healed. In the vein harvest site superiorly she has one superficial open area. On the dorsal aspect of her right great toe/MTP joint the wound is now divided into 2 however the proximal area is deep and there is palpable bone 01/01/17; still an open area in the middle of her original right surgical scar. The area on the right third toe and right lateral fifth metatarsal head remained closed. Problematic area on the dorsal aspect of the toe. Previous surgery in this area line 01/08/17 small open area on the original scar on her upper anterior leg although this is closing. X-ray I did of the right first toe did not show underlying bony abnormality. Still this area on the dorsal first toe probes to bone. We have been using Prisma 01/15/17; small open area in the original scar  in the upper anterior leg is almost fully closed. She has 2 open areas over the dorsal aspect of the right first toe that probes to bone. Used and a form starting last week. Vigorous bone scraping that I did last week showed few methicillin sensitive staph aureus. She is allergic to penicillin and sulfa drugs. I'm going to give her 2 weeks of doxycycline. She will need an MRI with contrast. 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 She continues on doxycycline for a bone scraping that showed methicillin sensitive staph aureus [allergic to penicillin and sulfa]. We have been using Endo form to the wound. 01/29/17; surprisingly her MRI did not show osteomyelitis. She continues on doxycycline for a bone scraping that showed methicillin sensitive staph aureus with allergies to penicillin and sulfa. We have been using Endo form to the wound. Unfortunately I cannot get a surface on this visit looks like it is able to support a healing state. Proximally there is still exposed bone. There is no overt soft tissue tenderness. Her MRI did show a previous fracture was surgery to this area but no hardware 02/12/17 on evaluation today patient appears to be doing well in regard to her lower extremity wound. She does have some mild discomfort but this is minimal. There's no evidence of infection. Her left great toe nail has somewhat been lifted up and there is a little bit of slight bleeding underneath but this is still firmly attached. 02/19/17; she ran out of doxycycline 2 days ago. Nevertheless I would like to continue this for 2 weeks to make a full 6 weeks of therapy as the bone scraping that I did from the open area on the dorsal right first toe showed MRSA. This should complete antibiotic therapy. 02/26/17; the patient is completing her 6 weeks of oral doxycycline. Deep wound on the dorsal right toe. This still has some exposed bone  proximally. She does have a reasonably granulated surface albeit thin over bone. I've been using Endo form have made application for an amniotic skin sub 03/05/17; the patient has completed 6 weeks of doxycycline. This is a deep wound on the dorsal right toe which has exposed bone proximally. She has granulation over most of this wound albeit a thin layer. I've been using Endo form. Still do not have approval for [employer] 03/13/17 on evaluation today patient's right foot wound appears to be doing better measurement wise compared to her last evaluation. She has been tolerating the dressing change without complication and  does have a appointment with the dietary that has been made as far as referral is concerned there just waiting for her and transportation to contact them back for an actual date. Nonetheless patient has been having less pain at this point. No fevers, chills, nausea, or vomiting noted at this time. 03/26/17; linear wound over the dorsal aspect of her right great toe. At one point this had exposed bone on the most superior aspect however I am pleased to see today that this appears to have a surface of granulation. Apparently product was applied Skiff, Nylani V. ([id number]) for but denied by [facility]. We'll need to see what that was. The patient has known PAD status post revascularization. MRI did not show osteomyelitis which was done in June 04/02/17; continued improvement using Endoform. She was approved for [employer] but hasn't over $735 co-pay per application, this is beyond her means 04/09/17; continues on Endoform. 04/16/17; appears to be doing nicely continuing on Endoform 04/22/17; patient did not have her dressing changed all week because of the weather. Using Endoform. Base of the wound looks healthy elbow there appears to events surrounding maceration perhaps because of the drainage was not changing the wound dressing 04/30/17 wound today continues to close and except for a small divot on  the superior part of the wound. This area did not probe the bone but I found this a little concerning as this was the area with exposed bone before we be able to get this to granulate forward. As I remember things she is not a candidate for skin substitutes secondary to an outlandish co-pay. I asked her husband to look into the out of pocket max for medications if he has 1 [Regranex] or totally for skin substitutes 05/07/17; the patient arrives today with a wound roughly the same size although under careful inspection under the light there appears to be some epithelialization. Her intake nurse noted that the Endoform seemed to be placed over the wound rather than in the deeper divots they could really benefit from the Endoform. At this point I have no plans to change the Endoform as at one point this was at least 33% exposed bone and the rest of the wound very close to the underlying phalanx 05/14/17; no major change in the size or appearance of the wound. We have been using Endoform for a prolonged period of time with reasonably good improvement in the epithelialization i.e. no exposed bone especially proximally. Unless we've not made a lot of changes in the last several weeks. She does not complain of pain. She was revascularized early this year or perhaps late last year by Dr. Bridgett Larsson and I wonder if he will need to have a look at her, I will need to review the actual vascular procedure which I think was a distal popliteal bypass 05/21/17; switched to either for a blue last week. Patient arrived in clinic with the wound not measuring any better but the surface looking some better. Unfortunately she had some surface slough and when I went to remove this superiorly she had exposed bone. Previously she had had exposed bone in the inferior part of the wound however this is granulated over some weeks ago. Clearly this is a major step back for her. With some difficulty I was able to obtain a piece of the  bone probing through the superior part of the wound for culture. We did not send pathology. It is likely that she is going to need imaging of this site but  I did not order this today 05/28/17; bone culture I took last week showed MSSA. She still has exposed bone however I could not get another piece for pathology. She had an MRI 4 months ago that did not show osteoarthritic at time. Wound rapidly deteriorated superiorly and I suspect this is underlying osteomyelitis. We have been using Hydrofera Blue 06/04/17 on evaluation today patient's wound appears to actually be doing a little bit better visually compared to last week's evaluation which is good news. Nonetheless she does have osteomyelitis of the toe which does not appear to be MRSA. No fevers, chills, nausea, or vomiting noted at this time. She is having no discomfort. 06/11/17; patient's wound looks a little healthier than I remember seeing it 2 weeks ago. There is no exposed bone and the surface of the wound appears well granulated. We've been using hydrofera blue. I note that she was started on doxycycline which was MSSA. Her appointment with Dr. Ola Spurr is on November 12 I believe. 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 Vallarie Mare of vascular surgery on 11/9. He offered right leg angiography possible intervention however the patient refused. I've discussed this with the patient's husband today she did not want any further procedures. Also noteworthy that Dr. Vallarie Mare stated that this wound had not healed in 3 years, I was not aware of this degree of chronicity in this area. She has underlying osteomyelitis by bone culture. Culture of this grew MSSA, she is allergic to penicillin and therefore not a candidate for beta lactams. She saw Dr. Ola Spurr of infectious disease this morning, he continued her on doxycycline for another month and apparently sent in a prescription. We have been using Hydrofera Blue. ABI's  update by Dr. Bridgett Larsson right 0.62, left 0.89. Monophasic wave forms 06/25/17 on evaluation today patient appears to be doing well in regard to her right great toe wound. She is still taking the doxycycline as prescribed by Dr. Ola Spurr. The Hydrofera Blue Dressing's to be doing as well as anything has for her up to this point and we are still waiting on an insurance authorization for the [place]. She is having no pain which is good news. No fevers, chills, nausea, or vomiting noted at this time. 07/02/17; still taking doxycycline as prescribed by Dr. Ola Spurr. Hydrofera Blue continues. We have no palpable bone. Still have not had had approval of Apligraf 08/06/16; the patient arrives today with [facility] even though we apparently had changed to Sorbact last time. Her co-pay for Regranex is $389 in discussion with the patient and her husband this was excessive. We'll continue with the Sorbact and standard dressings for now 08/20/17; we have been using sorbact. The wound bed still requires debridement. Wound measurements are slightly better. 09/03/17;using Sorbact.no debridement today. Wound measurements slightly better. There is some discoloration of the tip of her toelooks like bruising [name], [name] ([id number]) 09/17/17; using Sorbact. No debridement today. Wound dimensions are about the same. Still some discoloration at the tip of her great toe. This does not look any worse than last time 10/01/17; using sorbact right great toe I thought she might have surface epithelialization last time although it is certainly not look like that today nevertheless her dimensions are better. Continued surface eschar on the tip of her toe but this is not progressing. She is actually complaining of the left great toe 10/15/17; this is a very difficult area on the dorsal aspect of her proximal right great toe. At one point this  wound had exposed bone however we have managed to get some degree of  epithelialization. She was revascularized by Dr. Vallarie Mare in [place]. She saw Dr. Ola Spurr for underlying osteomyelitis. She is not a candidate for advanced treatment options [insurance issues]. She comes in today with a new area on the tip of her right great toe. The exact history here is unclear. We have been using sorbact 10/29/17; patient arrives today with very significant bilateral increase edema which appears to extend into the thighs. This is tight and not particularly pitting however it looks a lot more impressive than I remember. Not surprisingly the wounds on her right great toe have increased in size with weeping edema fluid as the edema extends into the dorsal foot itself. She also has a wound on the tip of her right great toe which is a new wound from 2 weeks ago. We have been using sorbact. 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 He is on Demadex presumably twice a day at 20 mg. I have asked her family to make her an appointment with her primary physicians at the [facility] to go over this degree of edema. This is causing I think deterioration in the right great toe wound. She also has significant PAD and is status post right leg bypass graft. I think this was done by [facility]. I believe they also said that there was nothing further that could be done with regards to her vascular status. 11/12/17; patient is going to see her primary doctor this afternoon with regards to lower extremity edema. She has 2 open wounds on the right great toe the original wound on the dorsal proximal part and then a more necrotic looking area on the tip of her right great toe. We took some time today to review her vascular status. The patient had a relate below knee popliteal to peroneal artery bypass with reversed greater saphenous vein on 06/13/17; she also had endarterectomy of the midsegment of the  peroneal artery. 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 I think at some point in time after that she was offered an arteriogram on the right but she declined. I've used Silver collagen to her wounds recently. 11/26/17; patient has 2 open wounds on the right great toe tip as well as the dorsal aspect of her right great toe. She has known PAD. I've been trying to get her back to see vein and vascular in [facility] to see if there are options for endovascular surgery to help with perfusion although the patient and her husband may not want any more surgery. We've been using silver alginate because of maceration. She does not have an option for advanced treatment/skin substitutes 12/10/17; continued open wounds much the same as 2 weeks ago on the dorsal aspect of the right great toe on the right great toe tip. I've encouraged to follow-up with Dr. Bridgett Larsson of vein and vascular to explore the possibility that there may be correctable ischemia involved in nonhealing these areas. We made considerable progress on the dorsal right great toe however it is stalled recently. We do not have an advanced treatment option. Originally this had exposed bone however there is a granulated base. Electronic Signature(s) Signed: 12/11/2017 1:16:01 PM By: Linton Ham MD Entered By: Linton Ham on 12/10/2017 12:55:31 Salway, [name]  [name] ([id number]) -------------------------------------------------------------------------------- Physical Exam Details Patient Name: Steadman, Alexyia V. Date of Service: 12/10/2017 10:15 AM Medical Record Number: [id number] Patient Account Number: [id number] Date of Birth/Sex: 1940/12/17 (77 y.o. F) Treating RN: Cornell Barman Primary Care Provider: Beverlyn Roux Other Clinician: Referring Provider: Beverlyn Roux Treating Provider/Extender: Tito Dine in  Treatment: 51 Constitutional Sitting or standing Blood Pressure is within target range for patient.. Pulse regular and within target range for patient.[name] Respirations regular, non-labored and within target range.. Temperature is normal and within the target range for the patient.[name] appears in no distress. Respiratory Respiratory effort is easy and symmetric bilaterally. Rate is normal at rest and on room air.. Bilateral breath sounds are clear and equal in all lobes with no wheezes, rales or rhonchi.. Cardiovascular Heart rhythm and rate regular, without murmur or gallop.. pedal pulses are absent on the right. 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 Integumentary (Hair, Skin) seborrheic keratosis however no other skin issues are seen. Psychiatric somewhat more listless than I'm used to seeing but she is awake and conversational. Notes wound exam; the original wound on the dorsal aspect of the right great toe is about the same. There is granulation here however it was not progressed to feeling in the depth of this wound. oThe area over the tip of the right great toe actually looks healthier this week no debridement is necessary. Electronic Signature(s) Signed: 12/11/2017 1:16:01 PM By: Linton Ham MD Entered By: Linton Ham on 12/10/2017 12:57:46 Merlino, [name] ([id number]) -------------------------------------------------------------------------------- Physician Orders Details Patient Name: Spratlin, Clarrissa V. Date of Service: 12/10/2017 10:15 AM Medical Record Number: [id number] Patient Account Number: [id number] Date of Birth/Sex: 09/16/1940 (77 y.o. F) Treating RN: Cornell Barman Primary Care Provider: Beverlyn Roux Other Clinician: Referring Provider: Beverlyn Roux Treating Provider/Extender: Tito Dine in Treatment: 54 Verbal / Phone Orders: No Diagnosis Coding Wound Cleansing Wound #11 Right Toe Great o Clean wound with Normal  Saline. o Cleanse wound with mild soap and water Anesthetic (add to Medication List) Wound #11 Right Toe Great o Topical Lidocaine 4% cream applied to wound bed prior to debridement (In Clinic Only). Wound #7 Right,Dorsal Metatarsal head first o Topical Lidocaine 4% cream applied to wound bed prior to debridement (In Clinic Only). Primary Wound Dressing Wound #11 Right Toe Great o Silver Collagen Wound #7 Right,Dorsal Metatarsal head first o Silver Collagen Secondary Dressing Wound #11 Right Toe Great o Conform/Kerlix Wound #7 Right,Dorsal Metatarsal head first o Conform/[place] Dressing Change Frequency Wound #11 Right Toe Great o Change Dressing Monday, Wednesday, Friday Follow-up Appointments Wound #11 Right Toe Great o Return Appointment in 2 weeks. Wound #7 Right,Dorsal Metatarsal head first o Return Appointment in 2 weeks. Edema Control Wound #11 Right Toe Great o Elevate legs to the level of the heart and pump ankles as often as possible Wound #7 Right,Dorsal Metatarsal head first [name], [name] ([id number]) o Elevate legs to the level of the heart and pump ankles as often as possible Additional Orders / Instructions Wound #11 Right Toe Great o Increase protein intake. o Activity as tolerated Wound #7 Right,Dorsal Metatarsal head first o Increase protein intake. o Activity as tolerated Home Health Wound #11 Right [place] [facility] - [facility] weekly, [facility] o [place] Visits - [facility] twice weekly, [facility]-Wednesday o Home Health Nurse may visit PRN to address patientos wound care needs. o Home Health Nurse may visit PRN to address patientos wound  care needs. o FACE TO FACE ENCOUNTER: MEDICARE and MEDICAID PATIENTS: I certify that this patient is under my care and that I had a face-to-face encounter that meets the physician face-to-face encounter  requirements with this patient on this date. The encounter with the patient was in whole or in part for the following MEDICAL CONDITION: (primary reason for [place]) MEDICAL NECESSITY: I certify, that based on my findings, NURSING services are a medically necessary home health service. HOME BOUND STATUS: I certify that my clinical findings support that this patient is homebound (i.e., Due to illness or injury, pt requires aid of supportive devices such as crutches, cane, wheelchairs, walkers, the use of special transportation or the assistance of another person to leave their place of residence. There is a normal inability to leave the home and doing so requires considerable and taxing effort. Other absences are for medical reasons / religious services and are infrequent or of short duration when for other reasons). o FACE TO FACE ENCOUNTER: MEDICARE and MEDICAID PATIENTS: I certify that this patient is under my care and that I had a face-to-face encounter that meets the physician face-to-face encounter requirements with this patient on this date. The encounter with the patient was in whole or in part for the following MEDICAL CONDITION: (primary reason for [place]) MEDICAL NECESSITY: I certify, that based on my findings, NURSING services are a medically necessary home health service. HOME BOUND STATUS: I certify that my clinical findings support that this patient is homebound (i.e., Due to illness or injury, pt requires aid of supportive devices such as crutches, cane, wheelchairs, walkers, the use of special transportation or the assistance of another person to leave their place of residence. There is a normal inability to leave the home and doing so requires considerable and taxing effort. Other absences are for medical reasons / religious services and are infrequent or of short duration when for other reasons). o If current dressing causes regression in wound condition, may  D/C ordered dressing product/s and apply Normal Saline Moist Dressing daily until next [place] / Other MD appointment. [place] of regression in wound condition at [phone]. o If current dressing causes regression in wound condition, may D/C ordered dressing product/s and apply Normal Saline Moist Dressing daily until next [place] / Other MD appointment. [place] of regression in wound condition at [phone]. o Please direct any NON-WOUND related issues/requests for orders to patient's Primary Care Physician o Please direct any NON-WOUND related issues/requests for orders to patient's Primary Care Physician Wound #7 Right,Dorsal Metatarsal head first o [place] [facility] - [facility] weekly, [facility]-Wednesday o [place] Visits - [facility] twice weekly, [place] Nurse may visit PRN to address patientos wound care needs. o Home Health Nurse may visit PRN to address patientos wound care needs. o FACE TO FACE ENCOUNTER: MEDICARE and MEDICAID PATIENTS: I certify that this patient is under my care and that I had a face-to-face encounter that meets the physician face-to-face encounter requirements with this patient on this date. The encounter with the patient was in whole or in part for the following MEDICAL CONDITION: (primary reason for [place]) MEDICAL NECESSITY: I certify, that based on my findings, NURSING services are a medically necessary home health service. HOME BOUND STATUS: I certify that my clinical findings support that this patient is homebound (i.e., Due to illness or injury, pt requires aid of  Wesch, Aariel V. ([id number]) supportive devices such as crutches, cane, wheelchairs, walkers, the use of special transportation or the assistance of another person to leave their place of residence. There is a normal inability to leave  the home and doing so requires considerable and taxing effort. Other absences are for medical reasons / religious services and are infrequent or of short duration when for other reasons). o FACE TO FACE ENCOUNTER: MEDICARE and MEDICAID PATIENTS: I certify that this patient is under my care and that I had a face-to-face encounter that meets the physician face-to-face encounter requirements with this patient on this date. The encounter with the patient was in whole or in part for the following MEDICAL CONDITION: (primary reason for [place]) MEDICAL NECESSITY: I certify, that based on my findings, NURSING services are a medically necessary home health service. HOME BOUND STATUS: I certify that my clinical findings support that this patient is homebound (i.e., Due to illness or injury, pt requires aid of supportive devices such as crutches, cane, wheelchairs, walkers, the use of special transportation or the assistance of another person to leave their place of residence. There is a normal inability to leave the home and doing so requires considerable and taxing effort. Other absences are for medical reasons / religious services and are infrequent or of short duration when for other reasons). o If current dressing causes regression in wound condition, may D/C ordered dressing product/s and apply Normal Saline Moist Dressing daily until next [place] / Other MD appointment. [place] of regression in wound condition at [phone]. o If current dressing causes regression in wound condition, may D/C ordered dressing product/s and apply Normal Saline Moist Dressing daily until next [place] / Other MD appointment. [place] of regression in wound condition at [phone]. o Please direct any NON-WOUND related issues/requests for orders to patient's Primary Care Physician o Please direct any NON-WOUND related issues/requests  for orders to patient's Primary Care Physician Medications-please add to medication list. Wound #11 Right Toe Great o P.O. Antibiotics - continue antibiotics as prescribed Wound #7 Right,Dorsal Metatarsal head first o P.O. Antibiotics - continue antibiotics as prescribed Consults o Vascular - Dr Bridgett Larsson 12/12/17 Electronic Signature(s) Signed: 12/10/2017 5:47:31 PM By: Gretta Cool, BSN, RN, CWS, Kim RN, BSN Signed: 12/11/2017 1:16:01 PM By: Linton Ham MD Entered By: Gretta Cool, BSN, RN, CWS, Kim on 12/10/2017 11:03:30 [name], [name] ([id number]) -------------------------------------------------------------------------------- Problem List Details Patient Name: Othman, Rylah V. Date of Service: 12/10/2017 10:15 AM Medical Record Number: [id number] Patient Account Number: [id number] Date of Birth/Sex: 04-04-1941 (77 y.o. 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 ([id number]) Electronic Signature(s) Signed: 12/11/2017 1:16:01 PM  By: Linton Ham MD Entered By: Linton Ham on 12/10/2017 12:52:41 Bueche, Gerldine [name] ([id number]) -------------------------------------------------------------------------------- Progress Note Details Patient Name: Bertino, Shilynn V. Date of Service: 12/10/2017 10:15 AM Medical Record Number: [id number] Patient Account Number: [id number] Date of Birth/Sex: 03-12-1941 (77 y.o. F) Treating RN: Cornell Barman Primary Care Provider: Beverlyn Roux Other Clinician: Referring Provider: Beverlyn Roux Treating Provider/Extender: Tito Dine in Treatment: 57 Subjective History of Present Illness (HPI) 08/13/16: This is a 77 year old woman who came predominantly for review of 3 cm in diameter circular wound to the left anterior lateral leg. She was in the ER on 08/01/16 I reviewed their notes. There was apparently pus coming out of the wound at that time and the patient arrived requesting debridement which they don't do in the emergency room. Nevertheless I can't see that they did any x-rays. There were no cultures done. She is a type II diabetic and I a note after the patient was in the clinic that she had a bypass graft from the popliteal to the tibial on the right on 02/28/16. She also had a right greater saphenous vein harvest on the same date for arterial bypass. She is going to have vascular studies including ABIs T ABIs on the right on 08/28/16. The patient's surgery was on 02/28/16 by Dr. Vallarie Mare she had a right below the knee popliteal artery to peroneal artery bypass with reverse greater saphenous vein and an endarterectomy of the mid segment peroneal artery. Postoperatively she had a strong mild monophasic peroneal signal with a pink foot. It would appear that the patient is had some nonhealing in the surgical saphenous vein harvest site on the left leg. Surprisingly looking [facility] I cannot see much information about this at all. Dr. Lucious Groves notes from 05/29/16 show that the  patient's wounds "are not healed" the right first metatarsal wound healed but then opened back up. 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 At that point the patient had a wound VAC to her right calf with regards to the wounds on her dorsal right toe would appear that these are felt to be arterial wounds. She has had surgery on the metatarsal phalangeal in 2015 I Dr. Doran Durand secondary to a right metatarsal phalangeal joint fracture. She is apparently had discoloration around this area since then. 08/28/16; patient arrives with her wounds in much the same condition. The linear vein harvest site and the circular wound below it which I think was a blister. She also has to probing holes in her right great toe and a necrotic eschar on the right second toe. Because of these being arterial wounds I reduced her compression from 3-2 layers this seems to of done satisfactorily she has not had any problems. I cannot see that she is actually had an x-ray ====== 11/06/16 the patient comes in for evaluation of her right lower extremity ulcers. She was here in January 2018 for 2 visits subsequently ended up in the hospital with pneumonia and then to rehabilitation. She has now been discharged from rehabilitation and is home. She has multiple ulcerations to her right lower extremity including the foot and toes. She does have home health in place and they have been placing alginate to the ulcers. She is followed by Dr. Bridgett Larsson of vascular medicine. 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 She  recently saw him on 3/23. In office ABIs were: Right 0.48 with monophasic flow to the DP, PT, peroneal Left 0.63 with monophasic flow to the DP and PT Her arterial studies indicated a patent right below knee popliteal to peroneal bypass She had an MRI in  February 2008 that was negative frosty myelitis but this showed general soft tissue edema in the right foot and lower extremity concerning for cellulitis She is a diabetic, managed with insulin. Her hemoglobin A1c in December 2017 was 8.4 which is a trend up from previous levels. She had blood work in February 2018 which revealed an albumin of 2.6 this appears to be relatively acute as an albumin in November 2017 was 3.7 11/13/16; this is a patient I have not seen since February who is readmitted to our clinic last week. 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 Bridgett Larsson. I have reviewed Dr. Lianne Moris notes from March/23/18. Doppler ABI on that date showed an ABI on the right of 0.48 and on the left of 0.63. Dorsalis pedis waveforms were monophasic bilaterally. There was no waveforms detected at the posterior tibial on the right, monophasic on the left. Dr. Lianne Moris comments were that this patient would have follow-up vascular studies in 3 months including ABIs and right lower Fenn[place], [name] V. ([id number]) extremity arterial duplex. She had an MRI in February that was negative for osteomyelitis but showed generalized edema in the foot. Last albumin I see was in January at 3.4 we have been using Santyl to the 4 wounds on the right leg the patient is noted today to have widespread edema well up towards her groin this is pitting 2-3+. 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 Normal ejection fraction. 11/20/16; patient has a follow-up appointment with Dr. Bridgett Larsson on April 23. She is still complaining of a lot of pain in the right foot and right leg. It is not clear to me that this is at all positional however I think it is clear claudication with minimal activity perhaps at rest. At our suggestion she is return to her primary physician's office tomorrow with regards to her pitting  lower extremity bilateral edema that I reviewed in detail last week 11/27/16; the follow-up with Dr. Bridgett Larsson was actually on May 23 on April 23 as I stated in my note last week. N/A case all of her wounds seems somewhat smaller. The 2 on the right leg are definitely smaller. The areas on the dorsal right first toe, right third toe and the lateral part of the right fifth metatarsal head all looks smaller but have tightly adherent surfaces. We have been using Santyl 12/04/16; follow-up with Dr. Bridgett Larsson on May 23. 2 small open wounds on the right leg continue to get smaller. The area on the right third total lateral aspect of the right fifth toe also look better. The remaining area on the dorsal first toe still has some depth to it. We have been using Santyl to the toes and collagen on the right 12/11/16; according to patient's husband the follow-up with Dr. Bridgett Larsson is not until July. All of the wounds on the right leg are measuring smaller. We have been using some combination of Prisma and Santyl although I think we can go to straight Prisma today. There may have been some confusion with home health about the primary dressing orders here. 12/25/16; the patient has had  some healing this week. The area on her right lateral fifth metatarsal head, right third toe are both healed lower right leg is healed. In the vein harvest site superiorly she has one superficial open area. On the dorsal aspect of her right great toe/MTP joint the wound is now divided into 2 however the proximal area is deep and there is palpable bone 01/01/17; still an open area in the middle of her original right surgical scar. The area on the right third toe and right lateral fifth metatarsal head remained closed. Problematic area on the dorsal aspect of the toe. Previous surgery in this area line 01/08/17 small open area on the original scar on her upper anterior leg although this is closing. X-ray I did of the right first toe did not show underlying  bony abnormality. Still this area on the dorsal first toe probes to bone. We have been using Prisma 01/15/17; small open area in the original scar in the upper anterior leg is almost fully closed. She has 2 open areas over the dorsal aspect of the right first toe that probes to bone. Used and a form starting last week. Vigorous bone scraping that I did last week showed few methicillin sensitive staph aureus. She is allergic to penicillin and sulfa drugs. I'm going to give her 2 weeks of doxycycline. She will need an MRI with contrast. 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 She continues on doxycycline for a bone scraping that showed methicillin sensitive staph aureus [allergic to penicillin and sulfa]. We have been using Endo form to the wound. 01/29/17; surprisingly her MRI did not show osteomyelitis. She continues on doxycycline for a bone scraping that showed methicillin sensitive staph aureus with allergies to penicillin and sulfa. We have been using Endo form to the wound. Unfortunately I cannot get a surface on this visit looks like it is able to support a healing state. Proximally there is still exposed bone. There is no overt soft tissue tenderness. Her MRI did show a previous fracture was surgery to this area but no hardware 02/12/17 on evaluation today patient appears to be doing well in regard to her lower extremity wound. She does have some mild discomfort but this is minimal. There's no evidence of infection. Her left great toe nail has somewhat been lifted up and there is a little bit of slight bleeding underneath but this is still firmly attached. 02/19/17; she ran out of doxycycline 2 days ago. Nevertheless I would like to continue this for 2 weeks to make a full 6 weeks of therapy as the bone scraping that I did from the open area on the dorsal right first toe showed MRSA. This should complete antibiotic  therapy. 02/26/17; the patient is completing her 6 weeks of oral doxycycline. Deep wound on the dorsal right toe. This still has some exposed bone proximally. She does have a reasonably granulated surface albeit thin over bone. I've been using Endo form have made application for an amniotic skin sub 03/05/17; the patient has completed 6 weeks of doxycycline. This is a deep wound on the dorsal right toe which has exposed bone proximally. She has granulation over most of this wound albeit a thin layer. I've been using Endo form. Still do not have approval for [facility] 03/13/17 on evaluation today patient's right foot wound appears to be doing better measurement wise compared to her last evaluation. She  has been tolerating the dressing change without complication and does have a appointment with the dietary that has been made as far as referral is concerned there just waiting for her and transportation to contact them back for an actual date. Nonetheless patient has been having less pain at this point. No fevers, chills, nausea, or vomiting noted at this time. 03/26/17; linear wound over the dorsal aspect of her right great toe. At one point this had exposed bone on the most superior aspect however I am pleased to see today that this appears to have a surface of granulation. Apparently product was applied for but denied by [facility] We'll need to see what that was. The patient has known PAD status post revascularization. MRI did not show osteomyelitis which was done in June [name], [name] ([id number]) 04/02/17; continued improvement using Endoform. She was approved for [facility] but hasn't over $562 co-pay per application, this is beyond her means 04/09/17; continues on Endoform. 04/16/17; appears to be doing nicely continuing on Endoform 04/22/17; patient did not have her dressing changed all week because of the weather. Using Endoform. Base of the wound looks healthy elbow there appears to events surrounding  maceration perhaps because of the drainage was not changing the wound dressing 04/30/17 wound today continues to close and except for a small divot on the superior part of the wound. This area did not probe the bone but I found this a little concerning as this was the area with exposed bone before we be able to get this to granulate forward. As I remember things she is not a candidate for skin substitutes secondary to an outlandish co-pay. I asked her husband to look into the out of pocket max for medications if he has 1 [Regranex] or totally for skin substitutes 05/07/17; the patient arrives today with a wound roughly the same size although under careful inspection under the light there appears to be some epithelialization. Her intake nurse noted that the Endoform seemed to be placed over the wound rather than in the deeper divots they could really benefit from the Endoform. At this point I have no plans to change the Endoform as at one point this was at least 33% exposed bone and the rest of the wound very close to the underlying phalanx 05/14/17; no major change in the size or appearance of the wound. We have been using Endoform for a prolonged period of time with reasonably good improvement in the epithelialization i.e. no exposed bone especially proximally. Unless we've not made a lot of changes in the last several weeks. She does not complain of pain. She was revascularized early this year or perhaps late last year by Dr. Bridgett Larsson and I wonder if he will need to have a look at her, I will need to review the actual vascular procedure which I think was a distal popliteal bypass 05/21/17; switched to either for a blue last week. Patient arrived in clinic with the wound not measuring any better but the surface looking some better. Unfortunately she had some surface slough and when I went to remove this superiorly she had exposed bone. Previously she had had exposed bone in the inferior part of the wound  however this is granulated over some weeks ago. Clearly this is a major step back for her. With some difficulty I was able to obtain a piece of the bone probing through the superior part of the wound for culture. We did not send pathology. It is likely that she  is going to need imaging of this site but I did not order this today 05/28/17; bone culture I took last week showed MSSA. She still has exposed bone however I could not get another piece for pathology. She had an MRI 4 months ago that did not show osteoarthritic at time. Wound rapidly deteriorated superiorly and I suspect this is underlying osteomyelitis. We have been using Hydrofera Blue 06/04/17 on evaluation today patient's wound appears to actually be doing a little bit better visually compared to last week's evaluation which is good news. Nonetheless she does have osteomyelitis of the toe which does not appear to be MRSA. No fevers, chills, nausea, or vomiting noted at this time. She is having no discomfort. 06/11/17; patient's wound looks a little healthier than I remember seeing it 2 weeks ago. There is no exposed bone and the surface of the wound appears well granulated. We've been using hydrofera blue. I note that she was started on doxycycline which was MSSA. Her appointment with Dr. Ola Spurr is on November 12 I believe. 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 Vallarie Mare of vascular surgery on 11/9. He offered right leg angiography possible intervention however the patient refused. I've discussed this with the patient's husband today she did not want any further procedures. Also noteworthy that Dr. Vallarie Mare stated that this wound had not healed in 3 years, I was not aware of this degree of chronicity in this area. She has underlying osteomyelitis by bone culture. Culture of this grew MSSA, she is allergic to penicillin and therefore not a candidate for beta lactams. She saw Dr. Ola Spurr of infectious disease  this morning, he continued her on doxycycline for another month and apparently sent in a prescription. We have been using Hydrofera Blue. ABI's update by Dr. Bridgett Larsson right 0.62, left 0.89. Monophasic wave forms 06/25/17 on evaluation today patient appears to be doing well in regard to her right great toe wound. She is still taking the doxycycline as prescribed by Dr. Ola Spurr. The Hydrofera Blue Dressing's to be doing as well as anything has for her up to this point and we are still waiting on an insurance authorization for the [place]. She is having no pain which is good news. No fevers, chills, nausea, or vomiting noted at this time. 07/02/17; still taking doxycycline as prescribed by Dr. Ola Spurr. Hydrofera Blue continues. We have no palpable bone. Still have not had had approval of Apligraf 08/06/16; the patient arrives today with [facility] even though we apparently had changed to Sorbact last time. Her co-pay for Regranex is $389 in discussion with the patient and her husband this was excessive. We'll continue with the Sorbact and standard dressings for now 08/20/17; we have been using sorbact. The wound bed still requires debridement. Wound measurements are slightly better. 09/03/17;using Sorbact.no debridement today. Wound measurements slightly better. There is some discoloration of the tip of her toelooks like bruising 09/17/17; using Sorbact. No debridement today. Wound dimensions are about the same. Still some discoloration at the tip of her great toe. This does not look any worse than last time [name], [name] ([id number]) 10/01/17; using sorbact right great toe I thought she might have surface epithelialization last time although it is certainly not look like that today nevertheless her dimensions are better. Continued surface eschar on the tip of her toe but this is not progressing. She is actually complaining of the left great toe 10/15/17; this is a very difficult area on the  dorsal aspect  of her proximal right great toe. At one point this wound had exposed bone however we have managed to get some degree of epithelialization. She was revascularized by Dr. Vallarie Mare in [place]. She saw Dr. Ola Spurr for underlying osteomyelitis. She is not a candidate for advanced treatment options [insurance issues]. She comes in today with a new area on the tip of her right great toe. The exact history here is unclear. We have been using sorbact 10/29/17; patient arrives today with very significant bilateral increase edema which appears to extend into the thighs. This is tight and not particularly pitting however it looks a lot more impressive than I remember. Not surprisingly the wounds on her right great toe have increased in size with weeping edema fluid as the edema extends into the dorsal foot itself. She also has a wound on the tip of her right great toe which is a new wound from 2 weeks ago. We have been using sorbact. 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 He is on Demadex presumably twice a day at 20 mg. I have asked her family to make her an appointment with her primary physicians at the [facility] to go over this degree of edema. This is causing I think deterioration in the right great toe wound. She also has significant PAD and is status post right leg bypass graft. I think this was done by [facility]. I believe they also said that there was nothing further that could be done with regards to her vascular status. 11/12/17; patient is going to see her primary doctor this afternoon with regards to lower extremity edema. She has 2 open wounds on the right great toe the original wound on the dorsal proximal part and then a more necrotic looking area on the tip of her right great toe. We took some time today to review her vascular status. The patient had a relate below knee  popliteal to peroneal artery bypass with reversed greater saphenous vein on 06/13/17; she also had endarterectomy of the midsegment of the peroneal artery. 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 I think at some point in time after that she was offered an arteriogram on the right but she declined. I've used Silver collagen to her wounds recently. 11/26/17; patient has 2 open wounds on the right great toe tip as well as the dorsal aspect of her right great toe. She has known PAD. I've been trying to get her back to see vein and vascular in [facility] to see if there are options for endovascular surgery to help with perfusion although the patient and her husband may not want any more surgery. We've been using silver alginate because of maceration. She does not have an option for advanced treatment/skin substitutes 12/10/17; continued open wounds much the same as 2 weeks ago on the dorsal aspect of the right great toe on the right great toe tip. I've encouraged to follow-up with Dr. Bridgett Larsson of vein and vascular to explore the possibility that there may be correctable ischemia involved in nonhealing these areas. We made considerable progress on the dorsal right great toe however it is stalled recently. We do not have an advanced treatment option. Originally this had exposed bone however there is a granulated base. Objective Constitutional Sitting or standing Blood Pressure is within target  range for patient.. Pulse regular and within target range for patient.[name] Respirations regular, non-labored and within target range.. Temperature is normal and within the target range for the patient.[name] appears in no distress. Vitals Time Taken: 10:16 AM, Height: 64 in, Weight: 200 lbs, BMI: 34.3, Temperature: 97.9 F, Pulse: 77 bpm, Respiratory Rate: 18 breaths/min, Blood Pressure:  138/51 mmHg. Douglass, Yanely V. ([id number]) Respiratory Respiratory effort is easy and symmetric bilaterally. Rate is normal at rest and on room air.. Bilateral breath sounds are clear and equal in all lobes with no wheezes, rales or rhonchi.. Cardiovascular Heart rhythm and rate regular, without murmur or gallop.. pedal pulses are absent on the right. 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 Psychiatric somewhat more listless than I'm used to seeing but she is awake and conversational. General Notes: wound exam; the original wound on the dorsal aspect of the right great toe is about the same. There is granulation here however it was not progressed to feeling in the depth of this wound. The area over the tip of the right great toe actually looks healthier this week no debridement is necessary. Integumentary (Hair, Skin) seborrheic keratosis however no other skin issues are seen. Wound #11 status is Open. Original cause of wound was Gradually Appeared. The wound is located on the Right Toe Great. The wound measures 2.4cm length x 0.9cm width x 0.3cm depth; 1.696cm^2 area and 0.509cm^3 volume. There is no tunneling or undermining noted. There is a large amount of serosanguineous drainage noted. The wound margin is distinct with the outline attached to the wound base. There is small (1-33%) pink granulation within the wound bed. There is a large (67-100%) amount of necrotic tissue within the wound bed including Adherent Slough. The periwound skin appearance exhibited: Excoriation. The periwound skin appearance did not exhibit: Callus, Crepitus, Induration, Rash, Scarring, Dry/Scaly, Maceration, Atrophie Blanche, Cyanosis, Ecchymosis, Hemosiderin Staining, Mottled, Pallor, Rubor, Erythema. Periwound temperature was noted as No Abnormality. The periwound has tenderness on palpation. Wound #7 status is Open. Original cause of wound was Gradually Appeared. The wound is  located on the Right,Dorsal Metatarsal head first. The wound measures 0.8cm length x 0.7cm width x 0.2cm depth; 0.44cm^2 area and 0.088cm^3 volume. There is Fat Layer (Subcutaneous Tissue) Exposed exposed. There is no tunneling or undermining noted. There is a large amount of serosanguineous drainage noted. The wound margin is flat and intact. There is large (67-100%) red granulation within the wound bed. There is a small (1-33%) amount of necrotic tissue within the wound bed including Adherent Slough. The periwound skin appearance exhibited: Induration, Hemosiderin Staining. The periwound skin appearance did not exhibit: Callus, Crepitus, Excoriation, Rash, Scarring, Dry/Scaly, Maceration, Atrophie Blanche, Cyanosis, Ecchymosis, Mottled, Pallor, Rubor, Erythema. Periwound temperature was noted as No Abnormality. The periwound has tenderness on palpation. 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 ([id number]) Plan Wound Cleansing: Wound #11 Right Toe Great: Clean wound with Normal Saline. Cleanse wound with mild soap  and water Anesthetic (add to Medication List): Wound #11 Right Toe Great: Topical Lidocaine 4% cream applied to wound bed prior to debridement (In Clinic Only). Wound #7 Right,Dorsal Metatarsal head first: Topical Lidocaine 4% cream applied to wound bed prior to debridement  (In Clinic Only). Primary Wound Dressing: Wound #11 Right Toe Great: Silver Collagen Wound #7 Right,Dorsal Metatarsal head first: Silver Collagen Secondary Dressing: Wound #11 Right Toe Great: Conform/Kerlix Wound #7 Right,Dorsal Metatarsal head first: Conform/Kerlix Dressing Change Frequency: Wound #11 Right Toe Great: Change Dressing Monday, Wednesday, Friday Follow-up Appointments: Wound #11 Right Toe Great: Return Appointment in 2 weeks. Wound #7 Right,Dorsal Metatarsal head first: Return Appointment in 2 weeks. Edema Control: Wound #11 Right Toe Great: Elevate legs to the level of the heart and pump ankles as often as possible Wound #7 Right,Dorsal Metatarsal head first: Elevate legs to the level of the heart and pump ankles as often as possible Additional Orders / Instructions: Wound #11 Right Toe Great: Increase protein intake. Activity as tolerated Wound #7 Right,Dorsal Metatarsal head first: Increase protein intake. Activity as tolerated Home Health: Wound #11 Right Toe Great: Continue Home Health Visits - [facility], [facility] Continue [facility] Health Visits - [facility] twice weekly, [facility] Home Health Nurse may visit [facility] to address patient s wound care needs. Home Health Nurse may visit [facility] to address patient s wound care needs. FACE TO FACE ENCOUNTER: MEDICARE and MEDICAID PATIENTS: I certify that this patient is under my care and that I had a face-to-face encounter that meets the physician face-to-face encounter requirements with this patient on this date. The encounter with the patient was in whole or in part for the following MEDICAL CONDITION: (primary reason for [place]) MEDICAL NECESSITY: I certify, that based on my findings, NURSING services are a medically necessary home health service. HOME BOUND STATUS: I certify that my clinical findings support that this patient is homebound (i.e., Due to illness  or injury, pt requires aid of supportive devices such as crutches, cane, wheelchairs, walkers, the use of special transportation or the assistance of another person to leave their place of residence. There is a normal inability to leave the home and doing so requires considerable and taxing effort. Other absences are for medical reasons / religious services and are infrequent or of short duration when for other reasons). FACE TO FACE ENCOUNTER: MEDICARE and MEDICAID PATIENTS: I certify that this patient is under my care and that I had a face-to-face encounter that meets the physician face-to-face encounter requirements with this patient on this date. The [place], KEONDRA HAYDU. ([id number]) encounter with the patient was in whole or in part for the following MEDICAL CONDITION: (primary reason for [place]) MEDICAL NECESSITY: I certify, that based on my findings, NURSING services are a medically necessary home health service. HOME BOUND STATUS: I certify that my clinical findings support that this patient is homebound (i.e., Due to illness or injury, pt requires aid of supportive devices such as crutches, cane, wheelchairs, walkers, the use of special transportation or the assistance of another person to leave their place of residence. There is a normal inability to leave the home and doing so requires considerable and taxing effort. Other absences are for medical reasons / religious services and are infrequent or of short duration when for other reasons). If current dressing causes regression in wound condition, may D/C ordered dressing product/s and apply Normal Saline Moist Dressing daily until next [place] / Other MD  appointment. [place] of regression in wound condition at [phone]. If current dressing causes regression in wound condition, may D/C ordered dressing product/s and apply Normal Saline Moist Dressing daily until next [place] / Other MD  appointment. [place] of regression in wound condition at [phone]. Please direct any NON-WOUND related issues/requests for orders to patient's Primary Care Physician Please direct any NON-WOUND related issues/requests for orders to patient's Primary Care Physician Wound #7 Right,Dorsal Metatarsal head first: [place] Visits - [facility] weekly, [facility] [place] Visits - [facility] twice weekly, [facility] Home Health Nurse may visit [facility] to address patient s wound care needs. Home Health Nurse may visit [facility] to address patient s wound care needs. FACE TO FACE ENCOUNTER: MEDICARE and MEDICAID PATIENTS: I certify that this patient is under my care and that I had a face-to-face encounter that meets the physician face-to-face encounter requirements with this patient on this date. The encounter with the patient was in whole or in part for the following MEDICAL CONDITION: (primary reason for [place]) MEDICAL NECESSITY: I certify, that based on my findings, NURSING services are a medically necessary home health service. HOME BOUND STATUS: I certify that my clinical findings support that this patient is homebound (i.e., Due to illness or injury, pt requires aid of supportive devices such as crutches, cane, wheelchairs, walkers, the use of special transportation or the assistance of another person to leave their place of residence. There is a normal inability to leave the home and doing so requires considerable and taxing effort. Other absences are for medical reasons / religious services and are infrequent or of short duration when for other reasons). FACE TO FACE ENCOUNTER: MEDICARE and MEDICAID PATIENTS: I certify that this patient is under my care and that I had a face-to-face encounter that meets the physician face-to-face encounter requirements with this patient on this date. The encounter with the patient  was in whole or in part for the following MEDICAL CONDITION: (primary reason for [place]) MEDICAL NECESSITY: I certify, that based on my findings, NURSING services are a medically necessary home health service. HOME BOUND STATUS: I certify that my clinical findings support that this patient is homebound (i.e., Due to illness or injury, pt requires aid of supportive devices such as crutches, cane, wheelchairs, walkers, the use of special transportation or the assistance of another person to leave their place of residence. There is a normal inability to leave the home and doing so requires considerable and taxing effort. Other absences are for medical reasons / religious services and are infrequent or of short duration when for other reasons). If current dressing causes regression in wound condition, may D/C ordered dressing product/s and apply Normal Saline Moist Dressing daily until next [place] / Other MD appointment. [place] of regression in wound condition at [phone]. If current dressing causes regression in wound condition, may D/C ordered dressing product/s and apply Normal Saline Moist Dressing daily until next [place] / Other MD appointment. [place] of regression in wound condition at [phone]. Please direct any NON-WOUND related issues/requests for orders to patient's Primary Care Physician Please direct any NON-WOUND related issues/requests for orders to patient's Primary Care Physician Medications-please add to medication list.: Wound #11 Right Toe Great: P.O. Antibiotics - continue antibiotics as prescribed Wound #7 Right,Dorsal Metatarsal head first: P.O. Antibiotics - continue antibiotics as prescribed Consults ordered were: Vascular -  Dr Bridgett Larsson 12/12/17 #1I'm continuing with silver collagen to both wound areas #2 consult with Dr. Bridgett Larsson with regards to any further revascularization options Vandivier, Dakisha  V. ([id number]) #3 could consider Tcom to determine whether there is adequate blood flow to the toe #4 I'm not convinced the patient will except the surgical option even if one is offered,nevertheless I thought it important that they discuss this with Dr. Bridgett Larsson Electronic Signature(s) Signed: 12/11/2017 1:16:01 PM By: Linton Ham MD Entered By: Linton Ham on 12/10/2017 12:59:13 Hitzeman, Oletha V. ([id number]) -------------------------------------------------------------------------------- [facility] Details Patient Name: Kendrix, Cari V. Date of Service: 12/10/2017 Medical Record Number: [id number] Patient Account Number: [id number] Date of Birth/Sex: 1941/01/19 (77 y.o. 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 I70.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, right ankle and foot Facility Procedures CPT4 Code: 91694503 Description: 99213 - WOUND CARE VISIT-LEV 3 EST PT Modifier: Quantity: 1 Physician Procedures CPT4 Code Description: 8882800 [place] - WC PHYS LEVEL 3 - EST PT ICD-10 Diagnosis Description L97.519 Non-pressure chronic ulcer of other part of right foot with unspe E11.621 Type 2 diabetes mellitus with foot ulcer Modifier: cified severit Quantity: 1 y Metallurgist) Signed: 12/11/2017 1:16:01 PM By: Linton Ham MD Entered By: Linton Ham on 12/10/2017 13:01:13

## 2017-12-24 ENCOUNTER — Encounter: Payer: Medicare HMO | Admitting: Internal Medicine

## 2017-12-24 DIAGNOSIS — E11622 Type 2 diabetes mellitus with other skin ulcer: Secondary | ICD-10-CM | POA: Diagnosis not present

## 2017-12-26 NOTE — Progress Notes (Signed)
[name] [name], [name] [name] ([id number]) Visit Report for 12/24/2017 Arrival Information Details Patient Name: [name] [name] KITTS. Date of Service: 12/24/2017 10:00 AM Medical Record Number: [id number] Patient Account Number: [id number] Date of Birth/Sex: 1940-11-04 (77 y.o. F) Treating RN: [name] [name] Primary Care Abrial Arrighi: [name] [name] Other Clinician: Referring Prestin Munch: [name] [name] Treating Chanler Schreiter/Extender: [name] [name] in Treatment: 17 Visit Information History Since Last Visit All ordered tests and consults were completed: No Patient Arrived: Wheel Chair Added or deleted any medications: No Arrival Time: 11:04 Any new allergies or adverse reactions: No Accompanied By: husband Had a fall or experienced change in No activities of daily living that may affect Transfer Assistance: None risk of falls: Patient Identification Verified: Yes Signs or symptoms of abuse/neglect since last visito No Secondary Verification Process Completed: Yes Hospitalized since last visit: No Patient Requires Transmission-Based No Implantable device outside of the clinic excluding No Precautions: cellular tissue based products placed in the center Patient Has Alerts: No since last visit: Pain Present Now: No Electronic Signature(s) Signed: 12/24/2017 4:05:18 PM By: [name] [name] Entered By: [name] [name] on 12/24/2017 11:05:06 [name], [name] ([id number]) -------------------------------------------------------------------------------- Complex / Palliative Patient Assessment Details Patient Name: [name] [name] V. Date of Service: 12/24/2017 10:00 AM Medical Record Number: [id number] Patient Account Number: [id number] Date of Birth/Sex: 12-Nov-1940 (77 y.o. F) Treating RN: [name] [name] Primary Care Rolando Whitby: [name] [name] Other Clinician: Referring Manual Navarra: [name] [name] Treating Clerence Gubser/Extender: [name] [name] in Treatment: 59 Palliative Management Criteria Complex Wound Management  Criteria Patient requires a surgical procedure in order to achieve wound healing: However, the patient does not wish to undergo the recommended surgical procedure. Care Approach Wound Care Plan: Complex Wound Management Electronic Signature(s) Signed: 12/24/2017 5:10:30 PM By: [name] Ham MD Signed: 12/24/2017 5:19:09 PM By: [name] [name], BSN, RN, CWS, Kim RN, BSN Entered By: [name] [name], BSN, RN, CWS, [name] on 12/24/2017 11:28:12 [name], [name] Leigh ([id number]) -------------------------------------------------------------------------------- Encounter Discharge Information Details Patient Name: [name] [name] V. Date of Service: 12/24/2017 10:00 AM Medical Record Number: [id number] Patient Account Number: [id number] Date of Birth/Sex: 08-02-1941 (77 y.o. F) Treating RN: Montey Hora Primary Care Deroy Noah: [name] [name] Other Clinician: Referring Martasia Talamante: [name] [name] Treating Korryn Pancoast/Extender: [name] [name] in Treatment: 33 Encounter Discharge Information Items Discharge Condition: Stable Ambulatory Status: Wheelchair Discharge Destination: Home Transportation: Private Auto Accompanied By: spouse Schedule Follow-up Appointment: Yes Clinical Summary of Care: Electronic Signature(s) Signed: 12/24/2017 1:02:37 PM By: Montey Hora Entered By: Montey Hora on 12/24/2017 13:02:37 [name], [name] ([id number]) -------------------------------------------------------------------------------- Lower Extremity Assessment Details Patient Name: [name] [name] V. Date of Service: 12/24/2017 10:00 AM Medical Record Number: [id number] Patient Account Number: [id number] Date of Birth/Sex: [birth year] (77 y.o. F) Treating RN: [name] [name] Primary Care Sakib Noguez: [name] [name] Other Clinician: Referring Dreux Mcgroarty: [name] [name] Treating Rachna Schonberger/Extender: [name] [name] in Treatment: 59 Edema Assessment Assessed: [Left: No] [Right: No] Edema: [Left: Ye] [Right: s] Calf Left: Right: Point of  Measurement: 35 cm From Medial Instep cm 45.2 cm Ankle Left: Right: Point of Measurement: 12 cm From Medial Instep cm 30 cm Vascular Assessment Claudication: Claudication Assessment [Right:None] Pulses: Dorsalis Pedis Palpable: [Right:Yes] Posterior Tibial Extremity colors, hair growth, and conditions: Extremity Color: [Right:Hyperpigmented] Hair Growth on Extremity: [Right:No] Temperature of Extremity: [Right:Warm] Capillary Refill: [Right:< 3 seconds] Toe Nail Assessment Left: Right: Thick: Yes Discolored: Yes Deformed: Yes Improper Length and Hygiene: Yes Electronic Signature(s) Signed: 12/24/2017 4:05:18 PM By: [name] [name] Entered By: [name] [name] on 12/24/2017 11:20:14 [name], [name] ([id number]) -------------------------------------------------------------------------------- Multi Wound Chart  Details Patient Name: [name] [name] V. Date of Service: 12/24/2017 10:00 AM Medical Record Number: [id number] Patient Account Number: [id number] Date of Birth/Sex: 23-Dec-1940 (77 y.o. 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] [7:Lower Extremity] Comorbid History: [11:Cataracts, Chronic sinus problems/congestion, Congestive Heart Failure, Hypertension, Type II Diabetes Hypertension, Type II Diabetes] [7:Cataracts, Chronic sinus problems/congestion, Congestive Heart  Failure,] [N/A:N/A] Date Acquired: [11:10/15/2017] [7:08/06/2013] [N/A:N/A] Weeks of Treatment: [11:10] [7:59] [N/A:N/A] Wound Status: [11:Open] [7:Open] [N/A:N/A] Measurements L x W x D [11:0.7x0.6x0.2] [7:2.5x0.8x0.4] [N/A:N/A] (cm) Area (cm) : [11:0.33] [7:1.571] [N/A:N/A] Volume (cm) : [11:0.066] [7:0.628] [N/A:N/A] % Reduction in Area: [11:-179.70%] [7:-33.40%] [N/A:N/A] % Reduction in Volume: [11:-450.00%] [7:-77.90%] [N/A:N/A] Classification: [11:Grade 2] [7:Grade 2] [N/A:N/A] Exudate Amount: [11:Medium] [7:Large] [N/A:N/A] Exudate Type: [11:Serosanguineous] [7:Serosanguineous] [N/A:N/A] Exudate Color: [11:red, brown] [7:red, brown] [N/A:N/A] Wound Margin: [11:Distinct, outline attached] [7:Flat and Intact] [N/A:N/A] Granulation Amount: [11:Small (1-33%)] [7:Medium (34-66%)] [N/A:N/A] Granulation Quality: [11:Pink] [7:Red] [N/A:N/A] Necrotic Amount: [11:Large (67-100%)] [7:Medium (34-66%)] [N/A:N/A] Necrotic Tissue: [11:Eschar, Adherent Slough] [7:Adherent Slough] [N/A:N/A] Exposed Structures: [11:Fascia: No Fat Layer (Subcutaneous Tissue) Exposed: No Tendon: No Muscle: No Joint: No Bone: No] [7:Fat Layer (Subcutaneous Tissue) Exposed: Yes Fascia: No Tendon: No Muscle: No Joint: No Bone: No] [N/A:N/A] Epithelialization: [11:Small (1-33%)] [7:Small (1-33%)] [N/A:N/A] Debridement: [11:Debridement - Excisional] [7:Debridement - Excisional] [N/A:N/A] Pre-procedure 11:29 11:29 N/A Verification/Time Out Taken: Pain Control: Other Other N/A Tissue Debrided: Subcutaneous Subcutaneous N/A Level: Skin/Subcutaneous Tissue Skin/Subcutaneous Tissue N/A Debridement Area (sq cm): 0.42 2 N/A Instrument: Curette Curette N/A Bleeding: Minimum Minimum N/A Hemostasis Achieved: Pressure Pressure N/A Procedural Pain: 0 0 N/A Post Procedural Pain: 1 1 N/A Debridement Treatment Procedure was tolerated well Procedure was tolerated well N/A Response: Post Debridement 0.7x0.6x0.3 2.5x0.8x0.3  N/A Measurements L x W x D (cm) Post Debridement Volume: 0.099 0.471 N/A (cm) Periwound Skin Texture: Excoriation: No Induration: Yes N/A Induration: No Excoriation: No Callus: No Callus: No Crepitus: No Crepitus: No Rash: No Rash: No Scarring: No Scarring: No Periwound Skin Moisture: Maceration: No Maceration: No N/A Dry/Scaly: No Dry/Scaly: No Periwound Skin Color: Atrophie Blanche: No Hemosiderin Staining: Yes N/A Cyanosis: No Atrophie Blanche: No Ecchymosis: No Cyanosis: No Erythema: No Ecchymosis: No Hemosiderin Staining: No Erythema: No Mottled: No Mottled: No Pallor: No Pallor: No Rubor: No Rubor: No Temperature: No Abnormality No Abnormality N/A Tenderness on Palpation: Yes Yes N/A Wound Preparation: Ulcer Cleansing: Ulcer Cleansing: N/A Rinsed/Irrigated with Saline Rinsed/Irrigated with Saline Topical Anesthetic Applied: Topical Anesthetic Applied: Other: lidocaine 4% Other: lidocaine 4% Procedures Performed: Debridement Debridement N/A Treatment Notes Electronic Signature(s) Signed: 12/24/2017 5:10:30 PM By: [name] Ham MD Entered By: [name] [name] on 12/24/2017 12:14:10 Fruth, Ronda [name] ([id number]) -------------------------------------------------------------------------------- Multi-Disciplinary Care Plan Details Patient Name: [name] [name] V. Date of Service: 12/24/2017 10:00 AM Medical Record Number: [id number] Patient Account Number: [id number] Date of Birth/Sex: 1940-08-10 (77 y.o. F) Treating RN: [name] [name] Primary Care Macklen Wilhoite: [name] [name] Other Clinician: Referring Cystal Shannahan: [name] [name] Treating Jakyiah Briones/Extender: [name] [name] in Treatment: 39 Active Inactive ` Abuse / Safety / Falls / Self Care Management Nursing Diagnoses: Impaired physical  mobility Potential for falls Goals: Patient will remain injury free Date Initiated: 11/06/2016 Target Resolution Date: 12/30/2016 Goal Status: Active Patient/caregiver  will verbalize understanding of skin care regimen Date Initiated: 11/06/2016 Target Resolution Date: 12/30/2016 Goal Status: Active Interventions: Assess fall risk on admission and as needed Treatment Activities: Patient referred to home care : 11/06/2016 Notes: ` Nutrition Nursing Diagnoses: Potential for alteratiion in Nutrition/Potential for imbalanced nutrition Goals: Patient/caregiver verbalizes understanding of need to maintain therapeutic glucose control per primary care physician Date Initiated: 11/06/2016 Target Resolution Date: 12/30/2016 Goal Status: Active Interventions: Provide education on elevated blood sugars and impact on wound healing Notes: ` Orientation to the Wound Care Program Nursing Diagnoses: Knowledge deficit related to the wound healing center program Yeaman, LARK LANGENFELD ([id number]) Goals: Patient/caregiver will verbalize understanding of the [place] Date Initiated: 11/06/2016 Target Resolution Date: 12/30/2016 Goal Status: Active Interventions: Provide education on orientation to the wound center Notes: ` Venous Leg Ulcer Nursing Diagnoses: Actual venous Insuffiency (use after diagnosis is confirmed) Knowledge deficit related to disease process and management Goals: Non-invasive venous studies are completed as ordered Date Initiated: 11/06/2016 Target Resolution Date: 12/30/2016 Goal Status: Active Patient/caregiver will verbalize understanding of disease process and disease management Date Initiated: 11/06/2016 Target Resolution Date: 12/30/2016 Goal Status: Active Interventions: Assess peripheral edema status every visit. Notes: ` Wound/Skin Impairment Nursing Diagnoses: Impaired tissue integrity Knowledge deficit related to smoking impact on wound healing Knowledge deficit related to ulceration/compromised skin integrity Goals: Ulcer/skin breakdown will heal within 14 weeks Date Initiated: 11/06/2016 Target Resolution Date:  02/05/2017 Goal Status: Active Interventions: Assess ulceration(s) every visit Treatment Activities: Skin care regimen initiated : 11/06/2016 Notes: Electronic Signature(s) Signed: 12/24/2017 5:19:09 PM By: [name] [name], BSN, RN, CWS, Kim RN, BSN [name] [name] V. ([id number]) Entered By: [name] [name], BSN, RN, CWS, [name] on 12/24/2017 11:27:40 Marcantonio, Dajia [name] ([id number]) -------------------------------------------------------------------------------- Pain Assessment Details Patient Name: [name] [name] Date of Service: 12/24/2017 10:00 AM Medical Record Number: [id number] Patient Account Number: [id number] Date of Birth/Sex: Apr 16, 1941 (77 y.o. F) Treating RN: [name] [name] Primary Care Kallee Nam: [name] [name] Other Clinician: Referring Merdith Boyd: [name] [name] Treating Yuvaan Olander/Extender: [name] [name] in Treatment: 21 Active Problems Location of Pain Severity and Description of Pain Patient Has Paino No Site Locations Pain Management and Medication Current Pain Management: Electronic Signature(s) Signed: 12/24/2017 4:05:18 PM By: [name] [name] Entered By: [name] [name] on 12/24/2017 11:05:14 Tutor, Kelaiah [name] ([id number]) -------------------------------------------------------------------------------- Patient/Caregiver Education Details Patient Name: [name] [name] V. Date of Service: 12/24/2017 10:00 AM Medical Record Number: [id number] Patient Account Number: [id number] Date of Birth/Gender: [birth year] (77 y.o. F) Treating RN: Montey Hora Primary Care Physician: [name] [name] Other Clinician: Referring Physician: Beverlyn [name] Treating Physician/Extender: [name] [name] in Treatment: 71 Education Assessment Education Provided To: Patient and Caregiver Education Topics Provided Wound/Skin Impairment: Handouts: Other: lotion to periwound Methods: Demonstration, Explain/Verbal Responses: State content correctly Electronic Signature(s) Signed: 12/24/2017 4:55:05 PM By:  Montey Hora Entered By: Montey Hora on 12/24/2017 13:02:56 Glassner, Arlee V. ([id number]) -------------------------------------------------------------------------------- Wound Assessment Details Patient Name: [name] [name] V. Date of Service: 12/24/2017 10:00 AM Medical Record Number: [id number] Patient Account Number: [id number] Date of Birth/Sex: [birth year] (77 y.o. F) Treating RN: [name] [name] Primary Care Strider Vallance: [name] [name] Other Clinician: Referring Ivey Nembhard: [name] [name] Treating Effie Wahlert/Extender: [name] [name] in Treatment: 17 Wound Status Wound Number: 11 Primary Diabetic Wound/Ulcer of the Lower Extremity Etiology: Wound Location: Right Toe Great Wound Open Wounding Event: Gradually Appeared Status: Date Acquired: 10/15/2017 Comorbid Cataracts, Chronic  sinus problems/congestion, Weeks Of Treatment: 10 History: Congestive Heart Failure, Hypertension, Type II Clustered Wound: No Diabetes Photos Photo Uploaded By: [name] [name] on 12/24/2017 16:21:30 Wound Measurements Length: (cm) 0.7 Width: (cm) 0.6 Depth: (cm) 0.2 Area: (cm) 0.33 Volume: (cm) 0.066 % Reduction in Area: -179.7% % Reduction in Volume: -450% Epithelialization: Small (1-33%) Tunneling: No Undermining: No Wound Description Classification: Grade 2 Wound Margin: Distinct, outline attached Exudate Amount: Medium Exudate Type: Serosanguineous Exudate Color: red, brown Foul Odor After Cleansing: No Slough/Fibrino Yes Wound Bed Granulation Amount: Small (1-33%) Exposed Structure Granulation Quality: Pink Fascia Exposed: No Necrotic Amount: Large (67-100%) Fat Layer (Subcutaneous Tissue) Exposed: No Necrotic Quality: Eschar, Adherent Slough Tendon Exposed: No Muscle Exposed: No Joint Exposed: No Bone Exposed: No Periwound Skin Texture Cazeau, Angeliyah V. ([id number]) Texture Color No Abnormalities Noted: No No Abnormalities Noted: No Callus: No Atrophie Blanche:  No Crepitus: No Cyanosis: No Excoriation: No Ecchymosis: No Induration: No Erythema: No Rash: No Hemosiderin Staining: No Scarring: No Mottled: No Pallor: No Moisture Rubor: No No Abnormalities Noted: No Dry / Scaly: No Temperature / Pain Maceration: No Temperature: No Abnormality Tenderness on Palpation: Yes Wound Preparation Ulcer Cleansing: Rinsed/Irrigated with Saline Topical Anesthetic Applied: Other: lidocaine 4%, Treatment Notes Wound #11 (Right Toe Great) 1. Cleansed with: Clean wound with Normal Saline 2. Anesthetic Topical Lidocaine 4% cream to wound bed prior to debridement 4. Dressing Applied: Prisma Ag 5. Secondary Dressing Applied ABD Pad Kerlix/Conform 7. Secured with Tape Notes darko shoe, heel cup Electronic Signature(s) Signed: 12/24/2017 4:05:18 PM By: [name] [name] Entered By: [name] [name] on 12/24/2017 11:17:10 [name] [name] V. ([id number]) -------------------------------------------------------------------------------- Wound Assessment Details Patient Name: [name] [name] V. Date of Service: 12/24/2017 10:00 AM Medical Record Number: [id number] Patient Account Number: [id number] Date of Birth/Sex: 1941/02/22 (76 y.o. F) Treating RN: [name] [name] Primary Care Madisen Ludvigsen: [name] [name] Other Clinician: Referring China Deitrick: [name] [name] Treating Shelley Pooley/Extender: [name] [name] in Treatment: 31 Wound Status Wound Number: 7 Primary Diabetic Wound/Ulcer of the Lower Extremity Etiology: Wound Location: Right Metatarsal head first - Dorsal Wound Open Wounding Event: Gradually Appeared Status: Date Acquired: 08/06/2013 Comorbid Cataracts, Chronic sinus problems/congestion, Weeks Of Treatment: 59 History: Congestive Heart Failure, Hypertension, Type II Clustered Wound: No Diabetes Photos Photo Uploaded By: [name] [name] on 12/24/2017 16:21:30 Wound Measurements Length: (cm) 2.5 Width: (cm) 0.8 Depth: (cm) 0.4 Area: (cm)  1.571 Volume: (cm) 0.628 % Reduction in Area: -33.4% % Reduction in Volume: -77.9% Epithelialization: Small (1-33%) Tunneling: No Undermining: No Wound Description Classification: Grade 2 Wound Margin: Flat and Intact Exudate Amount: Large Exudate Type: Serosanguineous Exudate Color: red, brown Foul Odor After Cleansing: No Slough/Fibrino Yes Wound Bed Granulation Amount: Medium (34-66%) Exposed Structure Granulation Quality: Red Fascia Exposed: No Necrotic Amount: Medium (34-66%) Fat Layer (Subcutaneous Tissue) Exposed: Yes Necrotic Quality: Adherent Slough Tendon Exposed: No Muscle Exposed: No Joint Exposed: No Bone Exposed: No Periwound Skin Texture [name] [name] V. ([id number]) Texture Color No Abnormalities Noted: No No Abnormalities Noted: No Callus: No Atrophie Blanche: No Crepitus: No Cyanosis: No Excoriation: No Ecchymosis: No Induration: Yes Erythema: No Rash: No Hemosiderin Staining: Yes Scarring: No Mottled: No Pallor: No Moisture Rubor: No No Abnormalities Noted: No Dry / Scaly: No Temperature / Pain Maceration: No Temperature: No Abnormality Tenderness on Palpation: Yes Wound Preparation Ulcer Cleansing: Rinsed/Irrigated with Saline Topical Anesthetic Applied: Other: lidocaine 4%, Treatment Notes Wound #7 (Right, Dorsal Metatarsal head first) 1. Cleansed with: Clean wound with Normal Saline 2. Anesthetic Topical Lidocaine 4% cream to wound  bed prior to debridement 4. Dressing Applied: Prisma Ag 5. Secondary Dressing Applied ABD Pad Kerlix/Conform 7. Secured with Tape Notes darko shoe, heel cup Electronic Signature(s) Signed: 12/24/2017 4:05:18 PM By: [name] [name] Entered By: [name] [name] on 12/24/2017 11:18:18 Dyches, [name] V. ([id number]) -------------------------------------------------------------------------------- Vitals Details Patient Name: Maruyama, Deneshia V. Date of Service: 12/24/2017 10:00 AM Medical Record Number:  [id number] Patient Account Number: [id number] Date of Birth/Sex: [birth year] (77 y.o. F) Treating RN: [name] [name] Primary Care Adanya Sosinski: [name] [name] Other Clinician: Referring Mayline Dragon: [name] [name] Treating Ivann [name]/Extender: [name] [name] in Treatment: 38 Vital Signs Time Taken: 11:05 Temperature (F): 98.2 Height (in): 64 Pulse (bpm): 71 Weight (lbs): 200 Respiratory Rate (breaths/min): 18 Body Mass Index (BMI): 34.3 Blood Pressure (mmHg): 117/57 Reference Range: 80 - 120 mg / dl Electronic Signature(s) Signed: 12/24/2017 4:05:18 PM By: [name] [name] Entered By: [name] [name] on 12/24/2017 11:14:25

## 2017-12-26 NOTE — Progress Notes (Signed)
[name] ([id number]) Visit Report for 12/24/2017 Debridement Details Patient Name: [name], [name] Date of Service: 12/24/2017 10:00 AM Medical Record Number: [id number] Patient Account Number: [id number] Date of Birth/Sex: [birth year] (77 y.o. F) Treating [name]: [name] Primary Care Provider: Beverlyn [name] Other Clinician: Referring Provider: Beverlyn [name] Treating Provider/Extender: [name] in Treatment: 59 Debridement Performed for Wound #11 Right Toe Great Assessment: Performed By: Physician [name] Dillon, MD Debridement Type: Debridement Severity of Tissue Pre Fat layer exposed Debridement: Pre-procedure Verification/Time Yes - 11:29 Out Taken: Start Time: 11:29 Pain Control: Other : lidocaine 4% Total Area Debrided (L x W): 0.7 (cm) x 0.6 (cm) = 0.42 (cm) Tissue and other material Viable, Non-Viable, Subcutaneous debrided: Level: Skin/Subcutaneous Tissue Debridement Description: Excisional Instrument: Curette Bleeding: Minimum Hemostasis Achieved: Pressure End Time: 11:30 Procedural Pain: 0 Post Procedural Pain: 1 Response to Treatment: Procedure was tolerated well Level of Consciousness: Awake and Alert Post Procedure Vitals: Temperature: 98.2 Pulse: 71 Respiratory Rate: 16 Blood Pressure: Systolic Blood Pressure: 193 Diastolic Blood Pressure: 57 Post Debridement Measurements of Total Wound Length: (cm) 0.7 Width: (cm) 0.6 Depth: (cm) 0.3 Volume: (cm) 0.099 Character of Wound/Ulcer Post Debridement: Stable Severity of Tissue Post Debridement: Fat layer exposed Post Procedure Diagnosis Same as Pre-procedure [name], [name] ([id number]) Electronic Signature(s) Signed: 12/24/2017 5:10:30 PM By: [name] Ham MD Signed: 12/24/2017 5:19:09 PM By: [name], BSN, [name], CWS, [name], BSN Entered By: [name] on 12/24/2017 12:14:24 [name]  ([id number]) -------------------------------------------------------------------------------- Debridement Details Patient Name: [name], [name] Date of Service: 12/24/2017 10:00 AM Medical Record Number: [id number] Patient Account Number: [id number] Date of Birth/Sex: October 01, 1940 (77 y.o. F) Treating [name]: [name] Primary Care Provider: Beverlyn [name] Other Clinician: Referring Provider: Beverlyn [name] Treating Provider/Extender: [name] in Treatment: 75 Debridement Performed for Wound #7 Right,Dorsal Metatarsal head first Assessment: Performed By: Physician [name] Dillon, MD Debridement Type: Debridement Severity of Tissue Pre Fat layer exposed Debridement: Pre-procedure Verification/Time Yes - 11:29 Out Taken: Start Time: 11:29 Pain Control: Other : lidocaine 4% Total Area Debrided (L x W): 2.5 (cm) x 0.8 (cm) = 2 (cm) Tissue and other material Viable, Non-Viable, Subcutaneous debrided: Level: Skin/Subcutaneous Tissue Debridement Description: Excisional Instrument: Curette Bleeding: Minimum Hemostasis Achieved: Pressure End Time: 11:30 Procedural Pain: 0 Post Procedural Pain: 1 Response to Treatment: Procedure was tolerated well Level of Consciousness: Awake and Alert Post Procedure Vitals: Temperature: 98.2 Pulse: 71 Respiratory Rate: 16 Blood Pressure: Systolic Blood Pressure: 979 Diastolic Blood Pressure: 57 Post Debridement Measurements of Total Wound Length: (cm) 2.5 Width: (cm) 0.8 Depth: (cm) 0.3 Volume: (cm) 0.471 Character of Wound/Ulcer Post Debridement: Stable Severity of Tissue Post Debridement: Fat layer exposed Post Procedure Diagnosis Same as Pre-procedure Electronic Signature(s) Signed: 12/24/2017 5:10:30 PM By: [name] Ham MD Signed: 12/24/2017 5:19:09 PM By: [name], BSN, [name], CWS, [name], BSN [name], [name] ([id number]) Entered By: [name] on 12/24/2017 12:14:37 [name], [name]  ([id number]) -------------------------------------------------------------------------------- HPI Details Patient Name: [name], [name] Date of Service: 12/24/2017 10:00 AM Medical Record Number: [id number] Patient Account Number: [id number] Date of Birth/Sex: [birth year] (77 y.o. F) Treating [name]: [name] Primary Care Provider: Beverlyn [name] Other Clinician: Referring Provider: Beverlyn [name] Treating Provider/Extender: [name] in Treatment: 75 History of Present Illness HPI Description: 08/13/16: This is a 77 year old woman who came predominantly for review of 3 cm in diameter circular wound to the left anterior lateral leg. She was in the ER on 08/01/16 I reviewed  their notes. There was apparently pus coming out of the wound at that time and the patient arrived requesting debridement which they don't do in the emergency room. Nevertheless I can't see that they did any x-rays. There were no cultures done. She is a type II diabetic and I a note after the patient was in the clinic that she had a bypass graft from the popliteal to the tibial on the right on 02/28/16. She also had a right greater saphenous vein harvest on the same date for arterial bypass. She is going to have vascular studies including ABIs T ABIs on the right on 08/28/16. The patient's surgery was on 02/28/16 by Dr. Vallarie Mare she had a right below the knee popliteal artery to peroneal artery bypass with reverse greater saphenous vein and an endarterectomy of the mid segment peroneal artery. Postoperatively she had a strong mild monophasic peroneal signal with a pink foot. It would appear that the patient is had some nonhealing in the surgical saphenous vein harvest site on the left leg. Surprisingly looking through [facility] I cannot see much information about this at all. Dr. Lucious Groves notes from 05/29/16 show that the patient's wounds "are not healed" the right first metatarsal wound healed but then opened back up.  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 At that point the patient had a wound VAC to her right calf with regards to the wounds on her dorsal right toe would appear that these are felt to be arterial wounds. She has had surgery on the metatarsal phalangeal in 2015 I Dr. Doran Durand secondary to a right metatarsal phalangeal joint fracture. She is apparently had discoloration around this area since then. 08/28/16; patient arrives with her wounds in much the same condition. The linear vein harvest site and the circular wound below it which I think was a blister. She also has to probing holes in her right great toe and a necrotic eschar on the right second toe. Because of these being arterial wounds I reduced her compression from 3-2 layers this seems to of done satisfactorily she has not had any problems. I cannot see that she is actually had an x-ray ====== 11/06/16 the patient comes in for evaluation of her right lower extremity ulcers. She was here in January 2018 for 2 visits subsequently ended up in the hospital with pneumonia and then to rehabilitation. She has now been discharged from rehabilitation and is home. She has multiple ulcerations to her right lower extremity including the foot and toes. She does have home health in place and they have been placing alginate to the ulcers. She is followed by Dr. Bridgett Larsson of vascular medicine. 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 She recently saw him on 3/23. In office ABIs were: Right 0.48 with monophasic flow to the DP, PT, peroneal Left 0.63 with monophasic flow to the DP and PT Her arterial studies indicated a patent right below knee popliteal to peroneal bypass She had an MRI in February 2008 that was negative frosty myelitis but this showed general soft tissue edema in the right  foot and lower extremity concerning for cellulitis She is a diabetic, managed with insulin. Her hemoglobin A1c in December 2017 was 8.4 which is a trend up from previous levels. She had blood work in February 2018 which revealed an albumin of 2.6  this appears to be relatively acute as an albumin in November 2017 was 3.7 11/13/16; this is a patient I have not seen since February who is readmitted to our clinic last week. 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 Bridgett Larsson. I have reviewed Dr. Lianne Moris notes from March/23/18. Doppler ABI on that date showed an ABI on the right of 0.48 and on the left of 0.63. Dorsalis pedis waveforms were monophasic bilaterally. There was no waveforms detected at the posterior tibial on the right, monophasic on the left. Dr. Lianne Moris comments were that this patient would have follow-up vascular studies in 3 months including ABIs and right lower extremity arterial duplex. She had an MRI in February that was negative for osteomyelitis but showed generalized edema in [place], Angle V. ([id number]) the foot. Last albumin I see was in January at 3.4 we have been using Santyl to the 4 wounds on the right leg the patient is noted today to have widespread edema well up towards her groin this is pitting 2-3+. 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 Normal ejection fraction. 11/20/16; patient has a follow-up appointment with Dr. Bridgett Larsson on April 23. She is still complaining of a lot of pain in the right foot and right leg. It is not clear to me that this is at all positional however I think it is clear claudication with minimal activity perhaps at rest. At our suggestion she is return to her primary physician's office tomorrow with regards to her pitting lower extremity bilateral edema that I reviewed in detail last week 11/27/16; the follow-up with Dr. Bridgett Larsson was  actually on May 23 on April 23 as I stated in my note last week. N/A case all of her wounds seems somewhat smaller. The 2 on the right leg are definitely smaller. The areas on the dorsal right first toe, right third toe and the lateral part of the right fifth metatarsal head all looks smaller but have tightly adherent surfaces. We have been using Santyl 12/04/16; follow-up with Dr. Bridgett Larsson on May 23. 2 small open wounds on the right leg continue to get smaller. The area on the right third total lateral aspect of the right fifth toe also look better. The remaining area on the dorsal first toe still has some depth to it. We have been using Santyl to the toes and collagen on the right 12/11/16; according to patient's husband the follow-up with Dr. Bridgett Larsson is not until July. All of the wounds on the right leg are measuring smaller. We have been using some combination of Prisma and Santyl although I think we can go to straight Prisma today. There may have been some confusion with home health about the primary dressing orders here. 12/25/16; the patient has had some healing this week. The area on her right lateral fifth metatarsal head, right third toe are both healed lower right leg is healed. In the vein harvest site superiorly she has one superficial open area. On the dorsal aspect of her right great toe/MTP joint the wound is now divided into 2 however the proximal area is deep and there is palpable bone 01/01/17; still an open area in the middle of her original right surgical scar. The area on the right third toe and right lateral fifth metatarsal head remained closed. Problematic area on the dorsal aspect of the toe. Previous surgery in this area line  01/08/17 small open area on the original scar on her upper anterior leg although this is closing. X-ray I did of the right first toe did not show underlying bony abnormality. Still this area on the dorsal first toe probes to bone. We have been using Prisma 01/15/17;  small open area in the original scar in the upper anterior leg is almost fully closed. She has 2 open areas over the dorsal aspect of the right first toe that probes to bone. Used and a form starting last week. Vigorous bone scraping that I did last week showed few methicillin sensitive staph aureus. She is allergic to penicillin and sulfa drugs. I'm going to give her 2 weeks of doxycycline. She will need an MRI with contrast. 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 She continues on doxycycline for a bone scraping that showed methicillin sensitive staph aureus [allergic to penicillin and sulfa]. We have been using Endo form to the wound. 01/29/17; surprisingly her MRI did not show osteomyelitis. She continues on doxycycline for a bone scraping that showed methicillin sensitive staph aureus with allergies to penicillin and sulfa. We have been using Endo form to the wound. Unfortunately I cannot get a surface on this visit looks like it is able to support a healing state. Proximally there is still exposed bone. There is no overt soft tissue tenderness. Her MRI did show a previous fracture was surgery to this area but no hardware 02/12/17 on evaluation today patient appears to be doing well in regard to her lower extremity wound. She does have some mild discomfort but this is minimal. There's no evidence of infection. Her left great toe nail has somewhat been lifted up and there is a little bit of slight bleeding underneath but this is still firmly attached. 02/19/17; she ran out of doxycycline 2 days ago. Nevertheless I would like to continue this for 2 weeks to make a full 6 weeks of therapy as the bone scraping that I did from the open area on the dorsal right first toe showed MRSA. This should complete antibiotic therapy. 02/26/17; the patient is completing her 6 weeks of oral doxycycline. Deep wound on the dorsal right  toe. This still has some exposed bone proximally. She does have a reasonably granulated surface albeit thin over bone. I've been using Endo form have made application for an amniotic skin sub 03/05/17; the patient has completed 6 weeks of doxycycline. This is a deep wound on the dorsal right toe which has exposed bone proximally. She has granulation over most of this wound albeit a thin layer. I've been using Endo form. Still do not have approval for Aff[employer] 03/13/17 on evaluation today patient's right foot wound appears to be doing better measurement wise compared to her last evaluation. She has been tolerating the dressing change without complication and does have a appointment with the dietary that has been made as far as referral is concerned there just waiting for her and transportation to contact them back for an actual date. Nonetheless patient has been having less pain at this point. No fevers, chills, nausea, or vomiting noted at this time. 03/26/17; linear wound over the dorsal aspect of her right great toe. At one point this had exposed bone on the most superior aspect however I am pleased to see today that this appears to have a surface of granulation. Apparently product was applied for but denied  by [facility]. We'll need to see what that was. The patient has known PAD status post revascularization. MRI did not show osteomyelitis which was done in June 04/02/17; continued improvement using Endoform. She was approved for [employer] but hasn't over $119 co-pay per application, [name], [name] ([id number]) this is beyond her means 04/09/17; continues on Endoform. 04/16/17; appears to be doing nicely continuing on Endoform 04/22/17; patient did not have her dressing changed all week because of the weather. Using Endoform. Base of the wound looks healthy elbow there appears to events surrounding maceration perhaps because of the drainage was not changing the wound dressing 04/30/17 wound today continues  to close and except for a small divot on the superior part of the wound. This area did not probe the bone but I found this a little concerning as this was the area with exposed bone before we be able to get this to granulate forward. As I remember things she is not a candidate for skin substitutes secondary to an outlandish co-pay. I asked her husband to look into the out of pocket max for medications if he has 1 [Regranex] or totally for skin substitutes 05/07/17; the patient arrives today with a wound roughly the same size although under careful inspection under the light there appears to be some epithelialization. Her intake nurse noted that the Endoform seemed to be placed over the wound rather than in the deeper divots they could really benefit from the Endoform. At this point I have no plans to change the Endoform as at one point this was at least 33% exposed bone and the rest of the wound very close to the underlying phalanx 05/14/17; no major change in the size or appearance of the wound. We have been using Endoform for a prolonged period of time with reasonably good improvement in the epithelialization i.e. no exposed bone especially proximally. Unless we've not made a lot of changes in the last several weeks. She does not complain of pain. She was revascularized early this year or perhaps late last year by Dr. Bridgett Larsson and I wonder if he will need to have a look at her, I will need to review the actual vascular procedure which I think was a distal popliteal bypass 05/21/17; switched to either for a blue last week. Patient arrived in clinic with the wound not measuring any better but the surface looking some better. Unfortunately she had some surface slough and when I went to remove this superiorly she had exposed bone. Previously she had had exposed bone in the inferior part of the wound however this is granulated over some weeks ago. Clearly this is a major step back for her. With some  difficulty I was able to obtain a piece of the bone probing through the superior part of the wound for culture. We did not send pathology. It is likely that she is going to need imaging of this site but I did not order this today 05/28/17; bone culture I took last week showed MSSA. She still has exposed bone however I could not get another piece for pathology. She had an MRI 4 months ago that did not show osteoarthritic at time. Wound rapidly deteriorated superiorly and I suspect this is underlying osteomyelitis. We have been using Hydrofera Blue 06/04/17 on evaluation today patient's wound appears to actually be doing a little bit better visually compared to last week's evaluation which is good news. Nonetheless she does have osteomyelitis of the toe which does not appear to be  MRSA. No fevers, chills, nausea, or vomiting noted at this time. She is having no discomfort. 06/11/17; patient's wound looks a little healthier than I remember seeing it 2 weeks ago. There is no exposed bone and the surface of the wound appears well granulated. We've been using hydrofera blue. I note that she was started on doxycycline which was MSSA. Her appointment with Dr. Ola Spurr is on November 12 I believe. 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 Vallarie Mare of vascular surgery on 11/9. He offered right leg angiography possible intervention however the patient refused. I've discussed this with the patient's husband today she did not want any further procedures. Also noteworthy that Dr. Vallarie Mare stated that this wound had not healed in 3 years, I was not aware of this degree of chronicity in this area. She has underlying osteomyelitis by bone culture. Culture of this grew MSSA, she is allergic to penicillin and therefore not a candidate for beta lactams. She saw Dr. Ola Spurr of infectious disease this morning, he continued her on doxycycline for another month and apparently sent in a  prescription. We have been using Hydrofera Blue. ABI's update by Dr. Bridgett Larsson right 0.62, left 0.89. Monophasic wave forms 06/25/17 on evaluation today patient appears to be doing well in regard to her right great toe wound. She is still taking the doxycycline as prescribed by Dr. Ola Spurr. The Hydrofera Blue Dressing's to be doing as well as anything has for her up to this point and we are still waiting on an insurance authorization for the [place]. She is having no pain which is good news. No fevers, chills, nausea, or vomiting noted at this time. 07/02/17; still taking doxycycline as prescribed by Dr. Ola Spurr. Hydrofera Blue continues. We have no palpable bone. Still have not had had approval of Apligraf 08/06/16; the patient arrives today with [facility] even though we apparently had changed to Sorbact last time. Her co-pay for Regranex is $389 in discussion with the patient and her husband this was excessive. We'll continue with the Sorbact and standard dressings for now 08/20/17; we have been using sorbact. The wound bed still requires debridement. Wound measurements are slightly better. 09/03/17;using Sorbact.no debridement today. Wound measurements slightly better. There is some discoloration of the tip of her toelooks like bruising 09/17/17; using Sorbact. No debridement today. Wound dimensions are about the same. Still some discoloration at the tip of her great toe. This does not look any worse than last time 10/01/17; using sorbact right great toe I thought she might have surface epithelialization last time although it is certainly not [place], Kayliegh V. ([id number]) look like that today nevertheless her dimensions are better. Continued surface eschar on the tip of her toe but this is not progressing. She is actually complaining of the left great toe 10/15/17; this is a very difficult area on the dorsal aspect of her proximal right great toe. At one point this wound had exposed bone  however we have managed to get some degree of epithelialization. She was revascularized by Dr. Vallarie Mare in [place]. She saw Dr. Ola Spurr for underlying osteomyelitis. She is not a candidate for advanced treatment options [insurance issues]. She comes in today with a new area on the tip of her right great toe. The exact history here is unclear. We have been using sorbact 10/29/17; patient arrives today with very significant bilateral increase edema which appears to extend into the thighs. This is tight and not particularly pitting however it looks a lot more impressive  than I remember. Not surprisingly the wounds on her right great toe have increased in size with weeping edema fluid as the edema extends into the dorsal foot itself. She also has a wound on the tip of her right great toe which is a new wound from 2 weeks ago. We have been using sorbact. 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 He is on Demadex presumably twice a day at 20 mg. I have asked her family to make her an appointment with her primary physicians at the [place] clinic to go over this degree of edema. This is causing I think deterioration in the right great toe wound. She also has significant PAD and is status post right leg bypass graft. I think this was done by [facility] and vascular. I believe they also said that there was nothing further that could be done with regards to her vascular status. 11/12/17; patient is going to see her primary doctor this afternoon with regards to lower extremity edema. She has 2 open wounds on the right great toe the original wound on the dorsal proximal part and then a more necrotic looking area on the tip of her right great toe. We took some time today to review her vascular status. The patient had a relate below knee popliteal to peroneal artery bypass with reversed greater saphenous vein on 06/13/17; she  also had endarterectomy of the midsegment of the peroneal artery. 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 I think at some point in time after that she was offered an arteriogram on the right but she declined. I've used Silver collagen to her wounds recently. 11/26/17; patient has 2 open wounds on the right great toe tip as well as the dorsal aspect of her right great toe. She has known PAD. I've been trying to get her back to see vein and vascular in [facility] to see if there are options for endovascular surgery to help with perfusion although the patient and her husband may not want any more surgery. We've been using silver alginate because of maceration. She does not have an option for advanced treatment/skin substitutes 12/10/17; continued open wounds much the same as 2 weeks ago on the dorsal aspect of the right great toe on the right great toe tip. I've encouraged to follow-up with Dr. Bridgett Larsson of vein and vascular to explore the possibility that there may be correctable ischemia involved in nonhealing these areas. We made considerable progress on the dorsal right great toe however it is stalled recently. We do not have an advanced treatment option. Originally this had exposed bone however there is a granulated base. 12/24/17; patient went back to see vein and vascular in [place]. She was seen by Manus Gunning NP. ABIs on the right were noted to be 0.84 and TBI of the right and 0.62. On the left her ABI was 0.82 and her TBI of 0.75. Waveforms were monophasic at the posterior tibial and dorsalis pedis bilaterally. 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 Her husband reiterates today  that she does not want an arteriogram therefore I'm not going to press the issue. We've been using  silver collagen to the wounds on the tip of the right great toe and dorsal surface of the right great toe Electronic Signature(s) Signed: 12/24/2017 5:10:30 PM By: [name] Ham MD Entered By: [name] on 12/24/2017 12:19:04 Vesely, Elisa [name] ([id number]) -------------------------------------------------------------------------------- Physical Exam Details Patient Name: Milbourn, Magdelene V. Date of Service: 12/24/2017 10:00 AM Medical Record Number: [id number] Patient Account Number: [id number] Date of Birth/Sex: 22-Jan-1941 (77 y.o. F) Treating [name]: [name] Primary Care Provider: Beverlyn [name] Other Clinician: Referring Provider: Beverlyn [name] Treating Provider/Extender: [name] in Treatment: 49 Constitutional Sitting or standing Blood Pressure is within target range for patient.. Pulse regular and within target range for patient.[name] Respirations regular, non-labored and within target range.. Temperature is normal and within the target range for the patient.[name] appears in no distress. Notes when examined; the original wound on the dorsal aspect of her right great toe as a necrotic surface over. Using a #3 curet this responded to debridement removing nonviable tissue. Hemostasis with direct pressure. oThe area over the tip of the right great toe appears smaller with a healthy granulated base. No debridement was necessary here. oNo suggestion of infection in either area. Electronic Signature(s) Signed: 12/24/2017 5:10:30 PM By: [name] Ham MD Entered By: [name] on 12/24/2017 12:20:29 [name], [name] ([id number]) -------------------------------------------------------------------------------- Physician Orders Details Patient Name: Affeldt, Cade V. Date of Service: 12/24/2017 10:00 AM Medical Record Number: [id number] Patient Account Number: [id number] Date of Birth/Sex:  [birth year] (77 y.o. F) Treating [name]: [name] Primary Care Provider: Beverlyn [name] Other Clinician: Referring Provider: Beverlyn [name] Treating Provider/Extender: [name] in Treatment: 30 Verbal / Phone Orders: No Diagnosis Coding Wound Cleansing Wound #11 Right Toe Great o Clean wound with Normal Saline. o Cleanse wound with mild soap and water Anesthetic (add to Medication List) Wound #11 Right Toe Great o Topical Lidocaine 4% cream applied to wound bed prior to debridement (In Clinic Only). Wound #7 Right,Dorsal Metatarsal head first o Topical Lidocaine 4% cream applied to wound bed prior to debridement (In Clinic Only). Primary Wound Dressing Wound #11 Right Toe Great o Silver Collagen Wound #7 Right,Dorsal Metatarsal head first o Silver Collagen Secondary Dressing Wound #11 Right Toe Great o Conform/Kerlix Wound #7 Right,Dorsal Metatarsal head first o Conform/Kerlix Dressing Change Frequency Wound #11 Right Toe Great o Change Dressing Monday, Wednesday, Friday Follow-up Appointments Wound #11 Right Toe Great o Return Appointment in 2 weeks. Wound #7 Right,Dorsal Metatarsal head first o Return Appointment in 2 weeks. Edema Control Wound #11 Right Toe Great o Elevate legs to the level of the heart and pump ankles as often as possible Wound #7 Right,Dorsal Metatarsal head first [name], [name] ([id number]) o Elevate legs to the level of the heart and pump ankles as often as possible Additional Orders / Instructions Wound #11 Right Toe Great o Increase protein intake. o Activity as tolerated Wound #7 Right,Dorsal Metatarsal head first o Increase protein intake. o Activity as tolerated Home Health Wound #11 Right [place] [facility] - [facility] weekly, [facility]-Wednesday o [place] [facility] - [facility] twice weekly, [facility]-Wednesday o Home Health Nurse may  visit PRN to address patientos wound care needs. o Home Health Nurse may visit PRN to address patientos wound care needs. o FACE TO FACE ENCOUNTER: MEDICARE and MEDICAID PATIENTS: I certify that this patient is under my care and that I had a face-to-face encounter that meets the physician face-to-face encounter requirements with this  patient on this date. The encounter with the patient was in whole or in part for the following MEDICAL CONDITION: (primary reason for [place]) MEDICAL NECESSITY: I certify, that based on my findings, NURSING services are a medically necessary home health service. HOME BOUND STATUS: I certify that my clinical findings support that this patient is homebound (i.e., Due to illness or injury, pt requires aid of supportive devices such as crutches, cane, wheelchairs, walkers, the use of special transportation or the assistance of another person to leave their place of residence. There is a normal inability to leave the home and doing so requires considerable and taxing effort. Other absences are for medical reasons / religious services and are infrequent or of short duration when for other reasons). o FACE TO FACE ENCOUNTER: MEDICARE and MEDICAID PATIENTS: I certify that this patient is under my care and that I had a face-to-face encounter that meets the physician face-to-face encounter requirements with this patient on this date. The encounter with the patient was in whole or in part for the following MEDICAL CONDITION: (primary reason for [place]) MEDICAL NECESSITY: I certify, that based on my findings, NURSING services are a medically necessary home health service. HOME BOUND STATUS: I certify that my clinical findings support that this patient is homebound (i.e., Due to illness or injury, pt requires aid of supportive devices such as crutches, cane, wheelchairs, walkers, the use of special transportation or the assistance of another person to leave  their place of residence. There is a normal inability to leave the home and doing so requires considerable and taxing effort. Other absences are for medical reasons / religious services and are infrequent or of short duration when for other reasons). o If current dressing causes regression in wound condition, may D/C ordered dressing product/s and apply Normal Saline Moist Dressing daily until next [place] / Other MD appointment. [place] of regression in wound condition at [phone]. o If current dressing causes regression in wound condition, may D/C ordered dressing product/s and apply Normal Saline Moist Dressing daily until next [place] / Other MD appointment. [place] of regression in wound condition at [phone]. o Please direct any NON-WOUND related issues/requests for orders to patient's Primary Care Physician o Please direct any NON-WOUND related issues/requests for orders to patient's Primary Care Physician Wound #7 Right,Dorsal Metatarsal head first o [place] [facility] - [facility] twice weekly, [facility]-Wednesday o [place] Visits - [facility] twice weekly, [place] Nurse may visit PRN to address patientos wound care needs. o Home Health Nurse may visit PRN to address patientos wound care needs. o FACE TO FACE ENCOUNTER: MEDICARE and MEDICAID PATIENTS: I certify that this patient is under my care and that I had a face-to-face encounter that meets the physician face-to-face encounter requirements with this patient on this date. The encounter with the patient was in whole or in part for the following MEDICAL CONDITION: (primary reason for [place]) MEDICAL NECESSITY: I certify, that based on my findings, NURSING services are a medically necessary home health service. HOME BOUND STATUS: I certify that my clinical findings support  that this patient is homebound (i.e., Due to illness or injury, pt requires aid of Soulliere, Tucker V. ([id number]) supportive devices such as crutches, cane, wheelchairs, walkers, the use of special transportation or the assistance of another person to leave their place of residence. There is a normal inability to leave  the home and doing so requires considerable and taxing effort. Other absences are for medical reasons / religious services and are infrequent or of short duration when for other reasons). o FACE TO FACE ENCOUNTER: MEDICARE and MEDICAID PATIENTS: I certify that this patient is under my care and that I had a face-to-face encounter that meets the physician face-to-face encounter requirements with this patient on this date. The encounter with the patient was in whole or in part for the following MEDICAL CONDITION: (primary reason for [place]) MEDICAL NECESSITY: I certify, that based on my findings, NURSING services are a medically necessary home health service. HOME BOUND STATUS: I certify that my clinical findings support that this patient is homebound (i.e., Due to illness or injury, pt requires aid of supportive devices such as crutches, cane, wheelchairs, walkers, the use of special transportation or the assistance of another person to leave their place of residence. There is a normal inability to leave the home and doing so requires considerable and taxing effort. Other absences are for medical reasons / religious services and are infrequent or of short duration when for other reasons). o If current dressing causes regression in wound condition, may D/C ordered dressing product/s and apply Normal Saline Moist Dressing daily until next [name] / Other MD appointment. [place] of regression in wound condition at [phone]. o If current dressing causes regression in wound condition, may D/C ordered dressing product/s and apply Normal  Saline Moist Dressing daily until next [place] / Other MD appointment. [place] of regression in wound condition at [phone]. o Please direct any NON-WOUND related issues/requests for orders to patient's Primary Care Physician o Please direct any NON-WOUND related issues/requests for orders to patient's Primary Care Physician Medications-please add to medication list. Wound #11 Right Toe Great o P.O. Antibiotics - continue antibiotics as prescribed Wound #7 Right,Dorsal Metatarsal head first o P.O. Antibiotics - continue antibiotics as prescribed Electronic Signature(s) Signed: 12/24/2017 5:10:30 PM By: [name] Ham MD Signed: 12/24/2017 5:19:09 PM By: [name], BSN, [name], CWS, [name], BSN Entered By: [name], BSN, [name], CWS, [name] on 12/24/2017 11:32:01 [name], [name] ([id number]) -------------------------------------------------------------------------------- Problem List Details Patient Name: [name], [name] Date of Service: 12/24/2017 10:00 AM Medical Record Number: [id number] Patient Account Number: [id number] Date of Birth/Sex: [birth year] (77 y.o. 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 with unspecified 11/06/2016 Yes severity E11.52 Type 2 diabetes mellitus with diabetic peripheral angiopathy 11/06/2016 Yes with gangrene 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 Date of Service: 12/24/2017 10:00 AM Medical Record Number: [id number] Patient Account Number: [id number] Date of Birth/Sex: 31-Oct-1940 (77 y.o.  F) Treating [name]: [name] Primary Care Provider: Beverlyn [name] Other Clinician: Referring Provider: Beverlyn [name] Treating Provider/Extender: [name] in Treatment: 59 Subjective History of Present Illness (HPI) 08/13/16: This is a 77 year old woman who came predominantly for review of 3 cm in diameter circular wound to the left anterior lateral leg. She was in the ER on 08/01/16 I reviewed their notes. There was apparently pus coming out of the wound at that time and the patient arrived requesting debridement which they don't do in the emergency room. Nevertheless I can't see that they did any x-rays. There were no cultures done. She is a type II diabetic and I a note after the patient was in the clinic that she had a bypass graft from the popliteal to the tibial on the right on 02/28/16. She also had a right greater saphenous vein harvest on the same date for arterial bypass. She is going to have vascular studies including ABIs T ABIs on the right on 08/28/16. The patient's surgery was on 02/28/16 by Dr. Vallarie Mare she had a right below the knee popliteal artery to peroneal artery bypass with reverse greater saphenous vein and an endarterectomy of the mid segment peroneal artery. Postoperatively she had a strong mild monophasic peroneal  signal with a pink foot. It would appear that the patient is had some nonhealing in the surgical saphenous vein harvest site on the left leg. Surprisingly looking through [facility] I cannot see much information about this at all. Dr. Lucious Groves notes from 05/29/16 show that the patient's wounds "are not healed" the right first metatarsal wound healed but then opened back up. 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 At that point the patient had a wound VAC to her right calf with regards to the wounds on her dorsal right toe would appear that these are felt to be arterial wounds. She has had surgery on the metatarsal phalangeal in 2015 I Dr. Doran Durand secondary to a right metatarsal phalangeal joint fracture. She is apparently had discoloration around this area since then. 08/28/16; patient arrives with her wounds in much the same condition. The linear vein harvest site and the circular wound below it which I think was a blister. She also has to probing holes in her right great toe and a necrotic eschar on the right second toe. Because of these being arterial wounds I reduced her compression from 3-2 layers this seems to of done satisfactorily she has not had any problems. I cannot see that she is actually had an x-ray ====== 11/06/16 the patient comes in for evaluation of her right lower extremity ulcers. She was here in January 2018 for 2 visits subsequently ended up in the hospital with pneumonia and then to rehabilitation. She has now been discharged from rehabilitation and is home. She has multiple ulcerations to her right lower extremity including the foot and toes. She does have home health in place and they have been placing alginate to the ulcers. She is followed by Dr. Bridgett Larsson of vascular medicine. She is status post a bypass graft to the right below knee popliteal to peroneal using reverse  GSV in July 2017. She recently saw him on 3/23. In office ABIs were: Right 0.48 with monophasic flow to the DP, PT, peroneal Left 0.63 with monophasic flow to the DP and PT Her arterial studies indicated a patent right below knee popliteal to peroneal bypass She  had an MRI in February 2008 that was negative frosty myelitis but this showed general soft tissue edema in the right foot and lower extremity concerning for cellulitis She is a diabetic, managed with insulin. Her hemoglobin A1c in December 2017 was 8.4 which is a trend up from previous levels. She had blood work in February 2018 which revealed an albumin of 2.6 this appears to be relatively acute as an albumin in November 2017 was 3.7 11/13/16; this is a patient I have not seen since February who is readmitted to our clinic last week. 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 Bridgett Larsson. I have reviewed Dr. Lianne Moris notes from March/23/18. Doppler ABI on that date showed an ABI on the right of 0.48 and on the left of 0.63. Dorsalis pedis waveforms were monophasic bilaterally. There was no waveforms detected at the posterior tibial on the right, monophasic on the left. Dr. Lianne Moris comments were that this patient would have follow-up vascular studies in 3 months including ABIs and right lower Cecilio, Betul V. ([id number]) extremity arterial duplex. She had an MRI in February that was negative for osteomyelitis but showed generalized edema in the foot. Last albumin I see was in January at 3.4 we have been using Santyl to the 4 wounds on the right leg the patient is noted today to have widespread edema well up towards her groin this is pitting 2-3+. 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 Normal ejection fraction. 11/20/16; patient has a follow-up appointment with Dr. Bridgett Larsson on April 23. She is still complaining of a lot of pain  in the right foot and right leg. It is not clear to me that this is at all positional however I think it is clear claudication with minimal activity perhaps at rest. At our suggestion she is return to her primary physician's office tomorrow with regards to her pitting lower extremity bilateral edema that I reviewed in detail last week 11/27/16; the follow-up with Dr. Bridgett Larsson was actually on May 23 on April 23 as I stated in my note last week. N/A case all of her wounds seems somewhat smaller. The 2 on the right leg are definitely smaller. The areas on the dorsal right first toe, right third toe and the lateral part of the right fifth metatarsal head all looks smaller but have tightly adherent surfaces. We have been using Santyl 12/04/16; follow-up with Dr. Bridgett Larsson on May 23. 2 small open wounds on the right leg continue to get smaller. The area on the right third total lateral aspect of the right fifth toe also look better. The remaining area on the dorsal first toe still has some depth to it. We have been using Santyl to the toes and collagen on the right 12/11/16; according to patient's husband the follow-up with Dr. Bridgett Larsson is not until July. All of the wounds on the right leg are measuring smaller. We have been using some combination of Prisma and Santyl although I think we can go to straight Prisma today. There may have been some confusion with home health about the primary dressing orders here. 12/25/16; the patient has had some healing this week. The area on her right lateral fifth metatarsal head, right third toe are both healed lower right leg is healed. In the vein harvest site superiorly she has one superficial open area. On the dorsal aspect of her right great  toe/MTP joint the wound is now divided into 2 however the proximal area is deep and there is palpable bone 01/01/17; still an open area in the middle of her original right surgical scar. The area on the right third toe and right lateral  fifth metatarsal head remained closed. Problematic area on the dorsal aspect of the toe. Previous surgery in this area line 01/08/17 small open area on the original scar on her upper anterior leg although this is closing. X-ray I did of the right first toe did not show underlying bony abnormality. Still this area on the dorsal first toe probes to bone. We have been using Prisma 01/15/17; small open area in the original scar in the upper anterior leg is almost fully closed. She has 2 open areas over the dorsal aspect of the right first toe that probes to bone. Used and a form starting last week. Vigorous bone scraping that I did last week showed few methicillin sensitive staph aureus. She is allergic to penicillin and sulfa drugs. I'm going to give her 2 weeks of doxycycline. She will need an MRI with contrast. 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 She continues on doxycycline for a bone scraping that showed methicillin sensitive staph aureus [allergic to penicillin and sulfa]. We have been using Endo form to the wound. 01/29/17; surprisingly her MRI did not show osteomyelitis. She continues on doxycycline for a bone scraping that showed methicillin sensitive staph aureus with allergies to penicillin and sulfa. We have been using Endo form to the wound. Unfortunately I cannot get a surface on this visit looks like it is able to support a healing state. Proximally there is still exposed bone. There is no overt soft tissue tenderness. Her MRI did show a previous fracture was surgery to this area but no hardware 02/12/17 on evaluation today patient appears to be doing well in regard to her lower extremity wound. She does have some mild discomfort but this is minimal. There's no evidence of infection. Her left great toe nail has somewhat been lifted up and there is a little bit of slight bleeding underneath but this is still  firmly attached. 02/19/17; she ran out of doxycycline 2 days ago. Nevertheless I would like to continue this for 2 weeks to make a full 6 weeks of therapy as the bone scraping that I did from the open area on the dorsal right first toe showed MRSA. This should complete antibiotic therapy. 02/26/17; the patient is completing her 6 weeks of oral doxycycline. Deep wound on the dorsal right toe. This still has some exposed bone proximally. She does have a reasonably granulated surface albeit thin over bone. I've been using Endo form have made application for an amniotic skin sub 03/05/17; the patient has completed 6 weeks of doxycycline. This is a deep wound on the dorsal right toe which has exposed bone proximally. She has granulation over most of this wound albeit a thin layer. I've been using Endo form. Still do not have approval for [facility] 03/13/17 on evaluation today patient's right foot wound appears to be doing better measurement wise compared to her last evaluation. She has been tolerating the dressing change without complication and does have a appointment with the dietary that has been made as far as referral is concerned there just waiting for her and transportation to contact them back for an actual date. Nonetheless patient has  been having less pain at this point. No fevers, chills, nausea, or vomiting noted at this time. 03/26/17; linear wound over the dorsal aspect of her right great toe. At one point this had exposed bone on the most superior aspect however I am pleased to see today that this appears to have a surface of granulation. Apparently product was applied for but denied by [facility]. We'll need to see what that was. The patient has known PAD status post revascularization. MRI did not show osteomyelitis which was done in June Solow, Itxel V. ([id number]) 04/02/17; continued improvement using Endoform. She was approved for [place] but hasn't over $025 co-pay per application, this is  beyond her means 04/09/17; continues on Endoform. 04/16/17; appears to be doing nicely continuing on Endoform 04/22/17; patient did not have her dressing changed all week because of the weather. Using Endoform. Base of the wound looks healthy elbow there appears to events surrounding maceration perhaps because of the drainage was not changing the wound dressing 04/30/17 wound today continues to close and except for a small divot on the superior part of the wound. This area did not probe the bone but I found this a little concerning as this was the area with exposed bone before we be able to get this to granulate forward. As I remember things she is not a candidate for skin substitutes secondary to an outlandish co-pay. I asked her husband to look into the out of pocket max for medications if he has 1 [Regranex] or totally for skin substitutes 05/07/17; the patient arrives today with a wound roughly the same size although under careful inspection under the light there appears to be some epithelialization. Her intake nurse noted that the Endoform seemed to be placed over the wound rather than in the deeper divots they could really benefit from the Endoform. At this point I have no plans to change the Endoform as at one point this was at least 33% exposed bone and the rest of the wound very close to the underlying phalanx 05/14/17; no major change in the size or appearance of the wound. We have been using Endoform for a prolonged period of time with reasonably good improvement in the epithelialization i.e. no exposed bone especially proximally. Unless we've not made a lot of changes in the last several weeks. She does not complain of pain. She was revascularized early this year or perhaps late last year by Dr. Bridgett Larsson and I wonder if he will need to have a look at her, I will need to review the actual vascular procedure which I think was a distal popliteal bypass 05/21/17; switched to either for a blue last  week. Patient arrived in clinic with the wound not measuring any better but the surface looking some better. Unfortunately she had some surface slough and when I went to remove this superiorly she had exposed bone. Previously she had had exposed bone in the inferior part of the wound however this is granulated over some weeks ago. Clearly this is a major step back for her. With some difficulty I was able to obtain a piece of the bone probing through the superior part of the wound for culture. We did not send pathology. It is likely that she is going to need imaging of this site but I did not order this today 05/28/17; bone culture I took last week showed MSSA. She still has exposed bone however I could not get another piece for pathology. She had an MRI 4 months  ago that did not show osteoarthritic at time. Wound rapidly deteriorated superiorly and I suspect this is underlying osteomyelitis. We have been using Hydrofera Blue 06/04/17 on evaluation today patient's wound appears to actually be doing a little bit better visually compared to last week's evaluation which is good news. Nonetheless she does have osteomyelitis of the toe which does not appear to be MRSA. No fevers, chills, nausea, or vomiting noted at this time. She is having no discomfort. 06/11/17; patient's wound looks a little healthier than I remember seeing it 2 weeks ago. There is no exposed bone and the surface of the wound appears well granulated. We've been using hydrofera blue. I note that she was started on doxycycline which was MSSA. Her appointment with Dr. Ola Spurr is on November 12 I believe. 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 Vallarie Mare of vascular surgery on 11/9. He offered right leg angiography possible intervention however the patient refused. I've discussed this with the patient's husband today she did not want any further procedures. Also noteworthy that Dr. Vallarie Mare stated that this wound  had not healed in 3 years, I was not aware of this degree of chronicity in this area. She has underlying osteomyelitis by bone culture. Culture of this grew MSSA, she is allergic to penicillin and therefore not a candidate for beta lactams. She saw Dr. Ola Spurr of infectious disease this morning, he continued her on doxycycline for another month and apparently sent in a prescription. We have been using Hydrofera Blue. ABI's update by Dr. Bridgett Larsson right 0.62, left 0.89. Monophasic wave forms 06/25/17 on evaluation today patient appears to be doing well in regard to her right great toe wound. She is still taking the doxycycline as prescribed by Dr. Ola Spurr. The Hydrofera Blue Dressing's to be doing as well as anything has for her up to this point and we are still waiting on an insurance authorization for the [place]. She is having no pain which is good news. No fevers, chills, nausea, or vomiting noted at this time. 07/02/17; still taking doxycycline as prescribed by Dr. Ola Spurr. Hydrofera Blue continues. We have no palpable bone. Still have not had had approval of Apligraf 08/06/16; the patient arrives today with [facility] even though we apparently had changed to Sorbact last time. Her co-pay for Regranex is $389 in discussion with the patient and her husband this was excessive. We'll continue with the Sorbact and standard dressings for now 08/20/17; we have been using sorbact. The wound bed still requires debridement. Wound measurements are slightly better. 09/03/17;using Sorbact.no debridement today. Wound measurements slightly better. There is some discoloration of the tip of her toelooks like bruising 09/17/17; using Sorbact. No debridement today. Wound dimensions are about the same. Still some discoloration at the tip of her great toe. This does not look any worse than last time Fischbach, [name] ([id number]) 10/01/17; using sorbact right great toe I thought she might have surface  epithelialization last time although it is certainly not look like that today nevertheless her dimensions are better. Continued surface eschar on the tip of her toe but this is not progressing. She is actually complaining of the left great toe 10/15/17; this is a very difficult area on the dorsal aspect of her proximal right great toe. At one point this wound had exposed bone however we have managed to get some degree of epithelialization. She was revascularized by Dr. Vallarie Mare in [place]. She saw Dr. Ola Spurr for underlying osteomyelitis. She is not a candidate  for advanced treatment options [insurance issues]. She comes in today with a new area on the tip of her right great toe. The exact history here is unclear. We have been using sorbact 10/29/17; patient arrives today with very significant bilateral increase edema which appears to extend into the thighs. This is tight and not particularly pitting however it looks a lot more impressive than I remember. Not surprisingly the wounds on her right great toe have increased in size with weeping edema fluid as the edema extends into the dorsal foot itself. She also has a wound on the tip of her right great toe which is a new wound from 2 weeks ago. We have been using sorbact. 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 He is on Demadex presumably twice a day at 20 mg. I have asked her family to make her an appointment with her primary physicians at the [facility] clinic to go over this degree of edema. This is causing I think deterioration in the right great toe wound. She also has significant PAD and is status post right leg bypass graft. I think this was done by [facility]. I believe they also said that there was nothing further that could be done with regards to her vascular status. 11/12/17; patient is going to see her primary doctor this afternoon with  regards to lower extremity edema. She has 2 open wounds on the right great toe the original wound on the dorsal proximal part and then a more necrotic looking area on the tip of her right great toe. We took some time today to review her vascular status. The patient had a relate below knee popliteal to peroneal artery bypass with reversed greater saphenous vein on 06/13/17; she also had endarterectomy of the midsegment of the peroneal artery. 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 I think at some point in time after that she was offered an arteriogram on the right but she declined. I've used Silver collagen to her wounds recently. 11/26/17; patient has 2 open wounds on the right great toe tip as well as the dorsal aspect of her right great toe. She has known PAD. I've been trying to get her back to see vein and vascular in [facility] to see if there are options for endovascular surgery to help with perfusion although the patient and her husband may not want any more surgery. We've been using silver alginate because of maceration. She does not have an option for advanced treatment/skin substitutes 12/10/17; continued open wounds much the same as 2 weeks ago on the dorsal aspect of the right great toe on the right great toe tip. I've encouraged to follow-up with Dr. Bridgett Larsson of vein and vascular to explore the possibility that there may be correctable ischemia involved in nonhealing these areas. We made considerable progress on the dorsal right great toe however it is stalled recently. We do not have an advanced treatment option. Originally this had exposed bone however there is a granulated base. 12/24/17; patient went back to see vein and vascular in [place]. She was seen by Manus Gunning NP. ABIs on the right were noted to be 0.84 and TBI of the right and 0.62. On  the left her ABI was 0.82 and her TBI of 0.75. Waveforms were monophasic at the posterior tibial  and dorsalis pedis bilaterally. 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 Her husband reiterates today that she does not want an arteriogram therefore I'm not going to press the issue. We've been using silver collagen to the wounds on the tip of the right great toe and dorsal surface of the right great toe Objective Constitutional Cardin, Stephany V. ([id number]) Sitting or standing Blood Pressure is within target range for patient.. Pulse regular and within target range for patient.[name] Respirations regular, non-labored and within target range.. Temperature is normal and within the target range for the patient.[name] appears in no distress. Vitals Time Taken: 11:05 AM, Height: 64 in, Weight: 200 lbs, BMI: 34.3, Temperature: 98.2 F, Pulse: 71 bpm, Respiratory Rate: 18 breaths/min, Blood Pressure: 117/57 mmHg. General Notes: when examined; the original wound on the dorsal aspect of her right great toe as a necrotic surface over. Using a #3 curet this responded to debridement removing nonviable tissue. Hemostasis with direct pressure. The area over the tip of the right great toe appears smaller with a healthy granulated base. No debridement was necessary here. No suggestion of infection in either area. Integumentary (Hair, Skin) Wound #11 status is Open. Original cause of wound was Gradually Appeared. The wound is located on the Right Toe Great. The wound measures 0.7cm length x 0.6cm width x 0.2cm depth; 0.33cm^2 area and 0.066cm^3 volume. There is no tunneling or undermining noted. There is a medium amount of serosanguineous drainage noted. The wound margin is distinct with the outline attached to the wound base. There is small  (1-33%) pink granulation within the wound bed. There is a large (67-100%) amount of necrotic tissue within the wound bed including Eschar and Adherent Slough. The periwound skin appearance did not exhibit: Callus, Crepitus, Excoriation, Induration, Rash, Scarring, Dry/Scaly, Maceration, Atrophie Blanche, Cyanosis, Ecchymosis, Hemosiderin Staining, Mottled, Pallor, Rubor, Erythema. Periwound temperature was noted as No Abnormality. The periwound has tenderness on palpation. Wound #7 status is Open. Original cause of wound was Gradually Appeared. The wound is located on the Right,Dorsal Metatarsal head first. The wound measures 2.5cm length x 0.8cm width x 0.4cm depth; 1.571cm^2 area and 0.628cm^3 volume. There is Fat Layer (Subcutaneous Tissue) Exposed exposed. There is no tunneling or undermining noted. There is a large amount of serosanguineous drainage noted. The wound margin is flat and intact. There is medium (34-66%) red granulation within the wound bed. There is a medium (34-66%) amount of necrotic tissue within the wound bed including Adherent Slough. The periwound skin appearance exhibited: Induration, Hemosiderin Staining. The periwound skin appearance did not exhibit: Callus, Crepitus, Excoriation, Rash, Scarring, Dry/Scaly, Maceration, Atrophie Blanche, Cyanosis, Ecchymosis, Mottled, Pallor, Rubor, Erythema. Periwound temperature was noted as No Abnormality. The periwound has tenderness on palpation. 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 There was a Excisional Skin/Subcutaneous Tissue Debridement with a total area of 0.42 sq cm performed by [name] Dillon, MD. With the following instrument(s): Curette to remove Viable and Non-Viable tissue/material. Material removed includes Subcutaneous Tissue after achieving pain control using Other (lidocaine 4%). A time out was conducted at 11:29, prior to the start of the procedure. A Minimum amount of bleeding was controlled with Pressure. The procedure was tolerated well with a pain level of 0 throughout and a pain level of 1 following the procedure. Patient s Level of Consciousness post procedure was recorded as Awake and Alert and post-procedure vitals were taken including Temperature: 98.2 F, Pulse: 71 bpm, Respiratory Rate: 16 breaths/min, Blood Pressure: (117)/(57) mmHg. Post Debridement Measurements: 0.7cm length x 0.6cm width x 0.3cm depth; 0.099cm^3 volume. Character of Wound/Ulcer Post Debridement is stable. Severity of Tissue Post Debridement is: Fat layer exposed. Post procedure Diagnosis Wound #11: Same as Pre-Procedure Wound #7 Pre-procedure diagnosis of Wound #7 is a Diabetic Wound/Ulcer of the Lower Extremity located on the Right,Dorsal Metatarsal head first .Severity of Tissue Pre Debridement is: Fat layer exposed. There was a Excisional Skin/Subcutaneous Tissue Debridement with a total area of 2 sq cm performed by [name] Dillon, MD. With the following instrument(s): Curette to remove Viable and Non-Viable tissue/material. Material removed includes Subcutaneous Tissue after achieving pain control  using Other (lidocaine 4%). A time out was conducted at 11:29, prior to the start of the procedure. A Minimum amount of bleeding was controlled with Pressure. The procedure was tolerated well with a pain level of 0 throughout and a pain level of 1 following the procedure. Patient s Level of Consciousness post procedure was recorded as Awake and Alert and post-procedure vitals were taken including Temperature: 98.2 F, Pulse: 71 bpm, Respiratory Rate: 16 breaths/min, Blood Pressure: (117)/(57) mmHg. Post Debridement Measurements: 2.5cm length x 0.8cm width x 0.3cm depth; 0.471cm^3 volume. Character of Wound/Ulcer Post Debridement is stable. Severity of Tissue Post Debridement is: Fat layer exposed. Post procedure Diagnosis Wound #7: Same as Pre-Procedure Plan Wound Cleansing: Wound #11 Right Toe Great: Clean wound with Normal Saline. Cleanse wound with mild soap and water Anesthetic (add to Medication List): Wound #11 Right Toe Great: Topical Lidocaine 4% cream applied to wound bed prior to debridement (In Clinic Only). Wound #7 Right,Dorsal Metatarsal head first: Topical Lidocaine 4% cream applied to wound bed prior to debridement (In Clinic Only). Primary Wound Dressing: Wound #11 Right Toe Great: Silver Collagen Wound #7 Right,Dorsal Metatarsal head first: Silver Collagen Secondary Dressing: Wound #11 Right Toe Great: Conform/K[place]ix Wound #7 Right,Dorsal Metatarsal head first: Conform/K[place]ix Dressing Change Frequency: Wound #11 Right Toe Great: Change Dressing Monday, Wednesday, Friday [name], Faustina V. ([id number]) Follow-up Appointments: Wound #11 Right Toe Great: Return Appointment in 2 weeks. Wound #7 Right,Dorsal Metatarsal head first: Return Appointment in 2 weeks. Edema Control: Wound #11 Right Toe Great: Elevate legs to the level of the heart and pump ankles as often as possible Wound #7 Right,Dorsal Metatarsal head first: Elevate legs to the level of the heart and pump  ankles as often as possible Additional Orders / Instructions: Wound #11 Right Toe Great: Increase protein intake. Activity as tolerated Wound #7 Right,Dorsal Metatarsal head first: Increase protein intake. Activity as tolerated Home Health: Wound #11 Right Toe Great: [place]  [facility] twice weekly, [facility]-Wednesday [place] [facility] - [facility] twice weekly, [facility]-Wednesday Home Health Nurse may visit PRN to address patient s wound care needs. Home Health Nurse may visit [facility]N to address patient s wound care needs. FACE TO FACE ENCOUNTER: MEDICARE and MEDICAID PATIENTS: I certify that this patient is under my care and that I had a face-to-face encounter that meets the physician face-to-face encounter requirements with this patient on this date. The encounter with the patient was in whole or in part for the following MEDICAL CONDITION: (primary reason for [place]) MEDICAL NECESSITY: I certify, that based on my findings, NURSING services are a medically necessary home health service. HOME BOUND STATUS: I certify that my clinical findings support that this patient is homebound (i.e., Due to illness or injury, pt requires aid of supportive devices such as crutches, cane, wheelchairs, walkers, the use of special transportation or the assistance of another person to leave their place of residence. There is a normal inability to leave the home and doing so requires considerable and taxing effort. Other absences are for medical reasons / religious services and are infrequent or of short duration when for other reasons). FACE TO FACE ENCOUNTER: MEDICARE and MEDICAID PATIENTS: I certify that this patient is under my care and that I had a face-to-face encounter that meets the physician face-to-face encounter requirements with this patient on this date. The encounter with the patient was in whole or in part for the following MEDICAL CONDITION:  (primary reason for [place]) MEDICAL NECESSITY: I certify, that based on my findings, NURSING services are a medically necessary home health service. HOME BOUND STATUS: I certify that my clinical findings support that this patient is homebound (i.e., Due to illness or injury, pt requires aid of supportive devices such as crutches, cane, wheelchairs, walkers, the use of special transportation or the assistance of another person to leave their place of residence. There is a normal inability to leave the home and doing so requires considerable and taxing effort. Other absences are for medical reasons / religious services and are infrequent or of short duration when for other reasons). If current dressing causes regression in wound condition, may D/C ordered dressing product/s and apply Normal Saline Moist Dressing daily until next [place] / Other MD appointment. [place] of regression in wound condition at [phone]. If current dressing causes regression in wound condition, may D/C ordered dressing product/s and apply Normal Saline Moist Dressing daily until next [place] / Other MD appointment. [place] of regression in wound condition at [phone]. Please direct any NON-WOUND related issues/requests for orders to patient's Primary Care Physician Please direct any NON-WOUND related issues/requests for orders to patient's Primary Care Physician Wound #7 Right,Dorsal Metatarsal head first: [place] - [facility] twice weekly, [facility]Wednesday [place] [facility] - [facility] twice weekly, W[facility] Care Center-Wednesday Home Health Nurse may visit PRN to address patient s wound care needs. Home Health Nurse may visit PRN to address patient s wound care needs. FACE TO FACE ENCOUNTER: MEDICARE and MEDICAID PATIENTS: I certify that this patient is under my care and that I had a face-to-face  encounter that meets the physician face-to-face encounter requirements with this patient on this date. The encounter with the patient was in whole or in part for the following MEDICAL CONDITION: (primary reason for [place]) MEDICAL NECESSITY: I certify, that based on my findings,  NURSING services are a medically necessary home health service. HOME BOUND STATUS: I certify that my clinical findings support that this patient is homebound (i.e., Due to illness or injury, pt requires aid of supportive devices such as crutches, cane, wheelchairs, walkers, the use of special [place], Jazae V. ([id number]) transportation or the assistance of another person to leave their place of residence. There is a normal inability to leave the home and doing so requires considerable and taxing effort. Other absences are for medical reasons / religious services and are infrequent or of short duration when for other reasons). FACE TO FACE ENCOUNTER: MEDICARE and MEDICAID PATIENTS: I certify that this patient is under my care and that I had a face-to-face encounter that meets the physician face-to-face encounter requirements with this patient on this date. The encounter with the patient was in whole or in part for the following MEDICAL CONDITION: (primary reason for [place]) MEDICAL NECESSITY: I certify, that based on my findings, NURSING services are a medically necessary home health service. HOME BOUND STATUS: I certify that my clinical findings support that this patient is homebound (i.e., Due to illness or injury, pt requires aid of supportive devices such as crutches, cane, wheelchairs, walkers, the use of special transportation or the assistance of another person to leave their place of residence. There is a normal inability to leave the home and doing so requires considerable and taxing effort. Other absences are for medical reasons / religious services and are infrequent or of short duration when for  other reasons). If current dressing causes regression in wound condition, may D/C ordered dressing product/s and apply Normal Saline Moist Dressing daily until next [place] / Other MD appointment. [place] of regression in wound condition at [phone]. If current dressing causes regression in wound condition, may D/C ordered dressing product/s and apply Normal Saline Moist Dressing daily until next [place] / Other MD appointment. Johnston of regression in wound condition at [phone]. Please direct any NON-WOUND related issues/requests for orders to patient's Primary Care Physician Please direct any NON-WOUND related issues/requests for orders to patient's Primary Care Physician Medications-please add to medication list.: Wound #11 Right Toe Great: P.O. Antibiotics - continue antibiotics as prescribed Wound #7 Right,Dorsal Metatarsal head first: P.O. Antibiotics - continue antibiotics as prescribed #1 I'm going to continue with silver collagen to both wound areas. We seem to be making progress in the tip of the toe and have at least stability with a healthy surface on the dorsal right toe. #2 she is not a candidate for an advanced treatment option as far as I know she has amount acceptable co-pay #3 I think the patient is simply too fearful to undergo another arteriogram. I'm not going to press this issue further. Otherwise I think Dr. Bridgett Larsson was prepared to go ahead and do this Electronic Signature(s) Signed: 12/24/2017 5:10:30 PM By: [name] Ham MD Entered By: [name] on 12/24/2017 12:21:41 Galano, Gabriellia [name] ([id number]) -------------------------------------------------------------------------------- [place] Details Patient Name: Purdy, Wyolene V. Date of Service: 12/24/2017 Medical Record Number: [id number] Patient Account Number: [id number] Date of Birth/Sex: Jun 02, 1941 (77 y.o. 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 with diabetic peripheral angiopathy with gangrene I70.232 Atherosclerosis of native arteries of right leg with ulceration of calf I70.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, right ankle and foot Facility Procedures CPT4 Code Description: [id number] 11042 - DEB SUBQ TISSUE 20 SQ CM/< ICD-10 Diagnosis Description 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

## 2018-01-07 ENCOUNTER — Encounter: Payer: Medicare HMO | Attending: Internal Medicine | Admitting: Internal Medicine

## 2018-01-07 DIAGNOSIS — I70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 of foot: Secondary | ICD-10-CM | POA: Insufficient documentation

## 2018-01-07 DIAGNOSIS — M86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, right ankle and foot: Secondary | ICD-10-CM | POA: Insufficient documentation

## 2018-01-07 DIAGNOSIS — E1169 Type 2 diabetes mellitus with other specified complication: Secondary | ICD-10-CM | POA: Insufficient documentation

## 2018-01-07 DIAGNOSIS — E11621 Type 2 diabetes mellitus with foot ulcer: Secondary | ICD-10-CM | POA: Insufficient documentation

## 2018-01-07 DIAGNOSIS — E1152 Type 2 diabetes mellitus with diabetic peripheral angiopathy with gangrene: Secondary | ICD-10-CM | POA: Insufficient documentation

## 2018-01-07 DIAGNOSIS — L97519 Non-pressure chronic ulcer of other part of right foot with unspecified severity: Secondary | ICD-10-CM | POA: Diagnosis not present

## 2018-01-09 NOTE — Progress Notes (Signed)
DIMPLES, PROBUS ([id number]) Visit Report for 01/07/2018 Debridement Details Patient Name: [name], [name] Date of Service: 01/07/2018 10:30 AM Medical Record Number: [id number] Patient Account Number: [id number] Date of Birth/Sex: [birth year] (77 y.o. F) Treating RN: Cornell Barman Primary Care Provider: Beverlyn Roux Other Clinician: Referring Provider: Beverlyn Roux Treating Provider/Extender: Tito Dine in Treatment: 61 Debridement Performed for Wound #7 Right,Dorsal Metatarsal head first Assessment: Performed By: Physician Ricard Dillon, MD Debridement Type: Debridement Severity of Tissue Pre Fat layer exposed Debridement: Pre-procedure Verification/Time Yes - 11:10 Out Taken: Start Time: 11:10 Pain Control: Other : lidocaine 4% Total Area Debrided (L x W): 2.3 (cm) x 1 (cm) = 2.3 (cm) Tissue and other material Viable, Non-Viable, Slough, Subcutaneous, Slough debrided: Level: Skin/Subcutaneous Tissue Debridement Description: Excisional Instrument: Curette Bleeding: Large Hemostasis Achieved: Pressure End Time: 11:12 Procedural Pain: 0 Post Procedural Pain: 0 Response to Treatment: Procedure was tolerated well Level of Consciousness: Awake and Alert Post Procedure Vitals: Temperature: 97.8 Pulse: 72 Respiratory Rate: 16 Blood Pressure: Systolic Blood Pressure: 229 Diastolic Blood Pressure: 46 Post Debridement Measurements of Total Wound Length: (cm) 2.3 Width: (cm) 1 Depth: (cm) 0.3 Volume: (cm) 0.542 Character of Wound/Ulcer Post Debridement: Stable Severity of Tissue Post Debridement: Fat layer exposed Post Procedure Diagnosis Same as Pre-procedure [name], [name] ([id number]) Electronic Signature(s) Signed: 01/07/2018 6:13:14 PM By: Linton Ham MD Signed: 01/08/2018 2:15:42 PM By: Gretta Cool, BSN, RN, CWS, Kim RN, BSN Entered By: Linton Ham on 01/07/2018 11:29:16 [name], [name]  ([id number]) -------------------------------------------------------------------------------- HPI Details Patient Name: [name], [name] Date of Service: 01/07/2018 10:30 AM Medical Record Number: [id number] Patient Account Number: [id number] Date of Birth/Sex: [birth year] (77 y.o. F) Treating RN: Cornell Barman Primary Care Provider: Beverlyn Roux Other Clinician: Referring Provider: Beverlyn Roux Treating Provider/Extender: Tito Dine in Treatment: 48 History of Present Illness HPI Description: 08/13/16: This is a 77 year old woman who came predominantly for review of 3 cm in diameter circular wound to the left anterior lateral leg. She was in the ER on 08/01/16 I reviewed their notes. There was apparently pus coming out of the wound at that time and the patient arrived requesting debridement which they don't do in the emergency room. Nevertheless I can't see that they did any x-rays. There were no cultures done. She is a type II diabetic and I a note after the patient was in the clinic that she had a bypass graft from the popliteal to the tibial on the right on 02/28/16. She also had a right greater saphenous vein harvest on the same date for arterial bypass. She is going to have vascular studies including ABIs T ABIs on the right on 08/28/16. The patient's surgery was on 02/28/16 by Dr. Vallarie Mare she had a right below the knee popliteal artery to peroneal artery bypass with reverse greater saphenous vein and an endarterectomy of the mid segment peroneal artery. Postoperatively she had a strong mild monophasic peroneal signal with a pink foot. It would appear that the patient is had some nonhealing in the surgical saphenous vein harvest site on the left leg. Surprisingly looking through [facility] I cannot see much information about this at all. Dr. Lucious Groves notes from 05/29/16 show that the patient's wounds "are not healed" the right first metatarsal wound healed but then opened back up.  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 At that point the patient had a wound VAC  to her right calf with regards to the wounds on her dorsal right toe would appear that these are felt to be arterial wounds. She has had surgery on the metatarsal phalangeal in 2015 I Dr. Doran Durand secondary to a right metatarsal phalangeal joint fracture. She is apparently had discoloration around this area since then. 08/28/16; patient arrives with her wounds in much the same condition. The linear vein harvest site and the circular wound below it which I think was a blister. She also has to probing holes in her right great toe and a necrotic eschar on the right second toe. Because of these being arterial wounds I reduced her compression from 3-2 layers this seems to of done satisfactorily she has not had any problems. I cannot see that she is actually had an x-ray ====== 11/06/16 the patient comes in for evaluation of her right lower extremity ulcers. She was here in January 2018 for 2 visits subsequently ended up in the hospital with pneumonia and then to rehabilitation. She has now been discharged from rehabilitation and is home. She has multiple ulcerations to her right lower extremity including the foot and toes. She does have home health in place and they have been placing alginate to the ulcers. She is followed by Dr. Bridgett Larsson of vascular medicine. 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 She recently saw him on 3/23. In office ABIs were: Right 0.48 with monophasic flow to the DP, PT, peroneal Left 0.63 with monophasic flow to the DP and PT Her arterial studies indicated a patent right below knee popliteal to peroneal bypass She had an MRI in February 2008 that was negative frosty myelitis but this showed general soft tissue edema in the right  foot and lower extremity concerning for cellulitis She is a diabetic, managed with insulin. Her hemoglobin A1c in December 2017 was 8.4 which is a trend up from previous levels. She had blood work in February 2018 which revealed an albumin of 2.6 this appears to be relatively acute as an albumin in November 2017 was 3.7 11/13/16; this is a patient I have not seen since February who is readmitted to our clinic last week. 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 Bridgett Larsson. I have reviewed Dr. Lianne Moris notes from March/23/18. Doppler ABI on that date showed an ABI on the right of 0.48 and on the left of 0.63. Dorsalis pedis waveforms were monophasic bilaterally. There was no waveforms detected at the posterior tibial on the right, monophasic on the left. Dr. Lianne Moris comments were that this patient would have follow-up vascular studies in 3 months including ABIs and right lower extremity arterial duplex. She had an MRI in February that was negative for osteomyelitis but showed generalized edema in Rief, Tamme V. ([id number]) the foot. Last albumin I see was in January at 3.4 we have been using Santyl to the 4 wounds on the right leg the patient is noted today to have widespread edema well up towards her groin this is pitting 2-3+. 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 Normal ejection fraction. 11/20/16; patient has a follow-up appointment with Dr. Bridgett Larsson on April 23. She is still complaining of a lot of pain in the right foot and right leg. It is not clear to me that this is at all positional however  I think it is clear claudication with minimal activity perhaps at rest. At our suggestion she is return to her primary physician's office tomorrow with regards to her pitting lower extremity bilateral edema that I reviewed in detail last week 11/27/16; the follow-up with Dr. Bridgett Larsson was  actually on May 23 on April 23 as I stated in my note last week. N/A case all of her wounds seems somewhat smaller. The 2 on the right leg are definitely smaller. The areas on the dorsal right first toe, right third toe and the lateral part of the right fifth metatarsal head all looks smaller but have tightly adherent surfaces. We have been using Santyl 12/04/16; follow-up with Dr. Bridgett Larsson on May 23. 2 small open wounds on the right leg continue to get smaller. The area on the right third total lateral aspect of the right fifth toe also look better. The remaining area on the dorsal first toe still has some depth to it. We have been using Santyl to the toes and collagen on the right 12/11/16; according to patient's husband the follow-up with Dr. Bridgett Larsson is not until July. All of the wounds on the right leg are measuring smaller. We have been using some combination of Prisma and Santyl although I think we can go to straight Prisma today. There may have been some confusion with home health about the primary dressing orders here. 12/25/16; the patient has had some healing this week. The area on her right lateral fifth metatarsal head, right third toe are both healed lower right leg is healed. In the vein harvest site superiorly she has one superficial open area. On the dorsal aspect of her right great toe/MTP joint the wound is now divided into 2 however the proximal area is deep and there is palpable bone 01/01/17; still an open area in the middle of her original right surgical scar. The area on the right third toe and right lateral fifth metatarsal head remained closed. Problematic area on the dorsal aspect of the toe. Previous surgery in this area line 01/08/17 small open area on the original scar on her upper anterior leg although this is closing. X-ray I did of the right first toe did not show underlying bony abnormality. Still this area on the dorsal first toe probes to bone. We have been using Prisma 01/15/17;  small open area in the original scar in the upper anterior leg is almost fully closed. She has 2 open areas over the dorsal aspect of the right first toe that probes to bone. Used and a form starting last week. Vigorous bone scraping that I did last week showed few methicillin sensitive staph aureus. She is allergic to penicillin and sulfa drugs. I'm going to give her 2 weeks of doxycycline. She will need an MRI with contrast. 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 She continues on doxycycline for a bone scraping that showed methicillin sensitive staph aureus [allergic to penicillin and sulfa]. We have been using Endo form to the wound. 01/29/17; surprisingly her MRI did not show osteomyelitis. She continues on doxycycline for a bone scraping that showed methicillin sensitive staph aureus with allergies to penicillin and sulfa. We have been using Endo form to the wound. Unfortunately I cannot get a surface on this visit looks like it is able to support a healing state. Proximally there is still exposed bone. There is no overt soft tissue  tenderness. Her MRI did show a previous fracture was surgery to this area but no hardware 02/12/17 on evaluation today patient appears to be doing well in regard to her lower extremity wound. She does have some mild discomfort but this is minimal. There's no evidence of infection. Her left great toe nail has somewhat been lifted up and there is a little bit of slight bleeding underneath but this is still firmly attached. 02/19/17; she ran out of doxycycline 2 days ago. Nevertheless I would like to continue this for 2 weeks to make a full 6 weeks of therapy as the bone scraping that I did from the open area on the dorsal right first toe showed MRSA. This should complete antibiotic therapy. 02/26/17; the patient is completing her 6 weeks of oral doxycycline. Deep wound on the dorsal right  toe. This still has some exposed bone proximally. She does have a reasonably granulated surface albeit thin over bone. I've been using Endo form have made application for an amniotic skin sub 03/05/17; the patient has completed 6 weeks of doxycycline. This is a deep wound on the dorsal right toe which has exposed bone proximally. She has granulation over most of this wound albeit a thin layer. I've been using Endo form. Still do not have approval for Affinity 03/13/17 on evaluation today patient's right foot wound appears to be doing better measurement wise compared to her last evaluation. She has been tolerating the dressing change without complication and does have a appointment with the dietary that has been made as far as referral is concerned there just waiting for her and transportation to contact them back for an actual date. Nonetheless patient has been having less pain at this point. No fevers, chills, nausea, or vomiting noted at this time. 03/26/17; linear wound over the dorsal aspect of her right great toe. At one point this had exposed bone on the most superior aspect however I am pleased to see today that this appears to have a surface of granulation. Apparently product was applied for but denied by [facility]. We'll need to see what that was. The patient has known PAD status post revascularization. MRI did not show osteomyelitis which was done in June 04/02/17; continued improvement using Endoform. She was approved for Oasis but hasn't over $696 co-pay per application, [name], [name] ([id number]) this is beyond her means 04/09/17; continues on Endoform. 04/16/17; appears to be doing nicely continuing on Endoform 04/22/17; patient did not have her dressing changed all week because of the weather. Using Endoform. Base of the wound looks healthy elbow there appears to events surrounding maceration perhaps because of the drainage was not changing the wound dressing 04/30/17 wound today continues  to close and except for a small divot on the superior part of the wound. This area did not probe the bone but I found this a little concerning as this was the area with exposed bone before we be able to get this to granulate forward. As I remember things she is not a candidate for skin substitutes secondary to an outlandish co-pay. I asked her husband to look into the out of pocket max for medications if he has 1 [Regranex] or totally for skin substitutes 05/07/17; the patient arrives today with a wound roughly the same size although under careful inspection under the light there appears to be some epithelialization. Her intake nurse noted that the Endoform seemed to be placed over the wound rather than in the deeper divots they could really benefit  from the Endoform. At this point I have no plans to change the Endoform as at one point this was at least 33% exposed bone and the rest of the wound very close to the underlying phalanx 05/14/17; no major change in the size or appearance of the wound. We have been using Endoform for a prolonged period of time with reasonably good improvement in the epithelialization i.e. no exposed bone especially proximally. Unless we've not made a lot of changes in the last several weeks. She does not complain of pain. She was revascularized early this year or perhaps late last year by Dr. Bridgett Larsson and I wonder if he will need to have a look at her, I will need to review the actual vascular procedure which I think was a distal popliteal bypass 05/21/17; switched to either for a blue last week. Patient arrived in clinic with the wound not measuring any better but the surface looking some better. Unfortunately she had some surface slough and when I went to remove this superiorly she had exposed bone. Previously she had had exposed bone in the inferior part of the wound however this is granulated over some weeks ago. Clearly this is a major step back for her. With some  difficulty I was able to obtain a piece of the bone probing through the superior part of the wound for culture. We did not send pathology. It is likely that she is going to need imaging of this site but I did not order this today 05/28/17; bone culture I took last week showed MSSA. She still has exposed bone however I could not get another piece for pathology. She had an MRI 4 months ago that did not show osteoarthritic at time. Wound rapidly deteriorated superiorly and I suspect this is underlying osteomyelitis. We have been using Hydrofera Blue 06/04/17 on evaluation today patient's wound appears to actually be doing a little bit better visually compared to last week's evaluation which is good news. Nonetheless she does have osteomyelitis of the toe which does not appear to be MRSA. No fevers, chills, nausea, or vomiting noted at this time. She is having no discomfort. 06/11/17; patient's wound looks a little healthier than I remember seeing it 2 weeks ago. There is no exposed bone and the surface of the wound appears well granulated. We've been using hydrofera blue. I note that she was started on doxycycline which was MSSA. Her appointment with Dr. Ola Spurr is on November 12 I believe. 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 Vallarie Mare of vascular surgery on 11/9. He offered right leg angiography possible intervention however the patient refused. I've discussed this with the patient's husband today she did not want any further procedures. Also noteworthy that Dr. Vallarie Mare stated that this wound had not healed in 3 years, I was not aware of this degree of chronicity in this area. She has underlying osteomyelitis by bone culture. Culture of this grew MSSA, she is allergic to penicillin and therefore not a candidate for beta lactams. She saw Dr. Ola Spurr of infectious disease this morning, he continued her on doxycycline for another month and apparently sent in a  prescription. We have been using Hydrofera Blue. ABI's update by Dr. Bridgett Larsson right 0.62, left 0.89. Monophasic wave forms 06/25/17 on evaluation today patient appears to be doing well in regard to her right great toe wound. She is still taking the doxycycline as prescribed by Dr. Ola Spurr. The Hydrofera Blue Dressing's to be doing as well as anything  has for her up to this point and we are still waiting on an insurance authorization for the [place]ino. She is having no pain which is good news. No fevers, chills, nausea, or vomiting noted at this time. 07/02/17; still taking doxycycline as prescribed by Dr. Ola Spurr. Hydrofera Blue continues. We have no palpable bone. Still have not had had approval of Apligraf 08/06/16; the patient arrives today with [facility] even though we apparently had changed to Sorbact last time. Her co-pay for Regranex is $389 in discussion with the patient and her husband this was excessive. We'll continue with the Sorbact and standard dressings for now 08/20/17; we have been using sorbact. The wound bed still requires debridement. Wound measurements are slightly better. 09/03/17;using Sorbact.no debridement today. Wound measurements slightly better. There is some discoloration of the tip of her toelooks like bruising 09/17/17; using Sorbact. No debridement today. Wound dimensions are about the same. Still some discoloration at the tip of her great toe. This does not look any worse than last time 10/01/17; using sorbact right great toe I thought she might have surface epithelialization last time although it is certainly not Bertling, Wylma V. ([id number]) look like that today nevertheless her dimensions are better. Continued surface eschar on the tip of her toe but this is not progressing. She is actually complaining of the left great toe 10/15/17; this is a very difficult area on the dorsal aspect of her proximal right great toe. At one point this wound had exposed bone  however we have managed to get some degree of epithelialization. She was revascularized by Dr. Vallarie Mare in [place]. She saw Dr. Ola Spurr for underlying osteomyelitis. She is not a candidate for advanced treatment options [insurance issues]. She comes in today with a new area on the tip of her right great toe. The exact history here is unclear. We have been using sorbact 10/29/17; patient arrives today with very significant bilateral increase edema which appears to extend into the thighs. This is tight and not particularly pitting however it looks a lot more impressive than I remember. Not surprisingly the wounds on her right great toe have increased in size with weeping edema fluid as the edema extends into the dorsal foot itself. She also has a wound on the tip of her right great toe which is a new wound from 2 weeks ago. We have been using sorbact. 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 He is on Demadex presumably twice a day at 20 mg. I have asked her family to make her an appointment with her primary physicians at the [place] [facility] to go over this degree of edema. This is causing I think deterioration in the right great toe wound. She also has significant PAD and is status post right leg bypass graft. I think this was done by [facility]. I believe they also said that there was nothing further that could be done with regards to her vascular status. 11/12/17; patient is going to see her primary doctor this afternoon with regards to lower extremity edema. She has 2 open wounds on the right great toe the original wound on the dorsal proximal part and then a more necrotic looking area on the tip of her right great toe. We took some time today to review her vascular status. The patient had a relate below knee popliteal to peroneal artery bypass with reversed greater saphenous vein on 06/13/17; she  also  had endarterectomy of the midsegment of the peroneal artery. 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 I think at some point in time after that she was offered an arteriogram on the right but she declined. I've used Silver collagen to her wounds recently. 11/26/17; patient has 2 open wounds on the right great toe tip as well as the dorsal aspect of her right great toe. She has known PAD. I've been trying to get her back to see vein and vascular in [facility] to see if there are options for endovascular surgery to help with perfusion although the patient and her husband may not want any more surgery. We've been using silver alginate because of maceration. She does not have an option for advanced treatment/skin substitutes 12/10/17; continued open wounds much the same as 2 weeks ago on the dorsal aspect of the right great toe on the right great toe tip. I've encouraged to follow-up with Dr. Bridgett Larsson of vein and vascular to explore the possibility that there may be correctable ischemia involved in nonhealing these areas. We made considerable progress on the dorsal right great toe however it is stalled recently. We do not have an advanced treatment option. Originally this had exposed bone however there is a granulated base. 12/24/17; patient went back to see vein and vascular in [place]. She was seen by Manus Gunning NP. ABIs on the right were noted to be 0.84 and TBI of the right and 0.62. On the left her ABI was 0.82 and her TBI of 0.75. Waveforms were monophasic at the posterior tibial and dorsalis pedis bilaterally. 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 Her husband reiterates today  that she does not want an arteriogram therefore I'm not going to press the issue. We've been using silver collagen to the wounds on the tip of the right great toe and dorsal surface of the right great toe. 01/07/18; patient arrives today with the tip of her toe healed. An cerebral amount of slough over the wound on the dorsal toe. As noted on 12/24/17 no options currently for revascularization the patient will not allow an arteriogram Electronic Signature(s) Signed: 01/07/2018 6:13:14 PM By: Linton Ham MD Entered By: Linton Ham on 01/07/2018 11:30:22 Kusch, [name] ([id number]) -------------------------------------------------------------------------------- Physical Exam Details Patient Name: Tirey, Shanah V. Date of Service: 01/07/2018 10:30 AM Medical Record Number: [id number] Patient Account Number: [id number] Date of Birth/Sex: 05-21-1941 (77 y.o. F) Treating RN: Cornell Barman Primary Care Provider: Beverlyn Roux Other Clinician: Referring Provider: Beverlyn Roux Treating Provider/Extender: Tito Dine in Treatment: 15 Constitutional Sitting or standing Blood Pressure is within target range for patient.. Pulse regular and within target range for patient.[name] Respirations regular, non-labored and within target range.. Temperature is normal and within the target range for the patient.[name] appears in no distress. Notes wound exam oThe original wound on the dorsal aspect of the right great toe again has a necrotic surface. Using a #3 curette. I removed adherent necrotic subcutaneous debris. Hemostasis with direct pressure. oThe area on the tip of the toe actually is closed, somewhat of a surprising development but pleasantly so. There is no evidence of infection in either area Electronic Signature(s) Signed: 01/07/2018 6:13:14 PM By: Linton Ham  MD Entered By: Linton Ham on 01/07/2018 11:31:37 Zammit, [name]  ([id number]) -------------------------------------------------------------------------------- Physician Orders Details Patient Name: Niemann, Marquetta V. Date of Service: 01/07/2018 10:30 AM Medical Record Number: [id number] Patient Account Number: [id number] Date of Birth/Sex: Oct 31, 1940 (77 y.o. F) Treating RN: Cornell Barman Primary Care Provider: Beverlyn Roux Other Clinician: Referring Provider: Beverlyn Roux Treating Provider/Extender: Tito Dine in Treatment: 68 Verbal / Phone Orders: No Diagnosis Coding Wound Cleansing Wound #7 Right,Dorsal Metatarsal head first o Clean wound with Normal Saline. Anesthetic (add to Medication List) Wound #7 Right,Dorsal Metatarsal head first o Topical Lidocaine 4% cream applied to wound bed prior to debridement (In Clinic Only). Primary Wound Dressing Wound #7 Right,Dorsal Metatarsal head first o Medihoney gel Secondary Dressing Wound #7 Right,Dorsal Metatarsal head first o ABD and Kerlix/Conform Dressing Change Frequency Wound #7 Right,Dorsal Metatarsal head first o Change Dressing Monday, Wednesday, Friday Follow-up Appointments Wound #7 Right,Dorsal Metatarsal head first o Return Appointment in 2 weeks. Edema Control Wound #7 Right,Dorsal Metatarsal head first o Elevate legs to the level of the heart and pump ankles as often as possible Additional Orders / Instructions Wound #7 Right,Dorsal Metatarsal head first o Increase protein intake. o Activity as tolerated Home Health Wound #7 Right,Dorsal Metatarsal head first o Continue [facility] Health Visits - [facility] weekly, [facility] o [place] Visits - [facility] twice weekly, [facility]-Wednesday o Home Health Nurse may visit [facility] to address patientos wound care needs. o Home Health Nurse may visit [facility] to address patientos wound care needs. o FACE TO FACE ENCOUNTER: MEDICARE and MEDICAID PATIENTS: I certify that this  patient is under my care and that I had a face-to-face encounter that meets the physician face-to-face encounter requirements with this Wittner, Ardath V. ([id number]) patient on this date. The encounter with the patient was in whole or in part for the following MEDICAL CONDITION: (primary reason for [place]) MEDICAL NECESSITY: I certify, that based on my findings, NURSING services are a medically necessary home health service. HOME BOUND STATUS: I certify that my clinical findings support that this patient is homebound (i.e., Due to illness or injury, pt requires aid of supportive devices such as crutches, cane, wheelchairs, walkers, the use of special transportation or the assistance of another person to leave their place of residence. There is a normal inability to leave the home and doing so requires considerable and taxing effort. Other absences are for medical reasons / religious services and are infrequent or of short duration when for other reasons). o FACE TO FACE ENCOUNTER: MEDICARE and MEDICAID PATIENTS: I certify that this patient is under my care and that I had a face-to-face encounter that meets the physician face-to-face encounter requirements with this patient on this date. The encounter with the patient was in whole or in part for the following MEDICAL CONDITION: (primary reason for [place]) MEDICAL NECESSITY: I certify, that based on my findings, NURSING services are a medically necessary home health service. HOME BOUND STATUS: I certify that my clinical findings support that this patient is homebound (i.e., Due to illness or injury, pt requires aid of supportive devices such as crutches, cane, wheelchairs, walkers, the use of special transportation or the assistance of another person to leave their place of residence. There is a normal inability to leave the home and doing so requires considerable and taxing effort. Other absences are for medical reasons /  religious services and are infrequent or of short duration when for other  reasons). o If current dressing causes regression in wound condition, may D/C ordered dressing product/s and apply Normal Saline Moist Dressing daily until next [place] / Other MD appointment. [place] of regression in wound condition at [phone]. o If current dressing causes regression in wound condition, may D/C ordered dressing product/s and apply Normal Saline Moist Dressing daily until next [place] / Other MD appointment. [place] of regression in wound condition at [phone]. o Please direct any NON-WOUND related issues/requests for orders to patient's Primary Care Physician o Please direct any NON-WOUND related issues/requests for orders to patient's Primary Care Physician Electronic Signature(s) Signed: 01/07/2018 6:13:14 PM By: Linton Ham MD Signed: 01/08/2018 2:15:42 PM By: Gretta Cool, BSN, RN, CWS, Kim RN, BSN Entered By: Gretta Cool, BSN, RN, CWS, Kim on 01/07/2018 11:14:37 Schembri, [name] ([id number]) -------------------------------------------------------------------------------- Problem List Details Patient Name: Norgard, Aide V. Date of Service: 01/07/2018 10:30 AM Medical Record Number: [id number] Patient Account Number: [id number] Date of Birth/Sex: 1941/05/13 (77 y.o. 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 with  unspecified 11/06/2016 No Yes severity E11.52 Type 2 diabetes mellitus with diabetic peripheral angiopathy 11/06/2016 No Yes with gangrene 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 Date of Service: 01/07/2018 10:30 AM Medical Record Number: [id number] Patient Account Number: [id number] Date of Birth/Sex: 03/08/1941 (77 y.o. F) Treating RN: Cornell Barman Primary Care Provider: Beverlyn Roux Other Clinician: Referring Provider: Beverlyn Roux Treating Provider/Extender: Tito Dine in Treatment: 61 Subjective History of Present Illness (HPI) 08/13/16: This is a 78 year old woman who came predominantly for review of 3 cm in diameter circular wound to the left anterior lateral leg. She was in the ER on 08/01/16 I reviewed their notes. There was apparently pus coming out of the wound at that time and the patient arrived requesting debridement which they don't do in the emergency room. Nevertheless I can't see that they did any x-rays. There were no cultures done. She is a type II diabetic and I a note after the patient was in the clinic that she had a bypass graft from the popliteal to the tibial on the right on 02/28/16. She also had a right greater saphenous vein harvest on the same date for arterial bypass. She is going to have vascular studies including ABIs T ABIs on the right on 08/28/16. The patient's  surgery was on 02/28/16 by Dr. Vallarie Mare she had a right below the knee popliteal artery to peroneal artery bypass with reverse greater saphenous vein and an endarterectomy of the mid segment peroneal artery. Postoperatively she had a strong mild monophasic peroneal signal  with a pink foot. It would appear that the patient is had some nonhealing in the surgical saphenous vein harvest site on the left leg. Surprisingly looking through [facility] I cannot see much information about this at all. Dr. Lucious Groves notes from 05/29/16 show that the patient's wounds "are not healed" the right first metatarsal wound healed but then opened back up. 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 At that point the patient had a wound VAC to her right calf with regards to the wounds on her dorsal right toe would appear that these are felt to be arterial wounds. She has had surgery on the metatarsal phalangeal in 2015 I Dr. Doran Durand secondary to a right metatarsal phalangeal joint fracture. She is apparently had discoloration around this area since then. 08/28/16; patient arrives with her wounds in much the same condition. The linear vein harvest site and the circular wound below it which I think was a blister. She also has to probing holes in her right great toe and a necrotic eschar on the right second toe. Because of these being arterial wounds I reduced her compression from 3-2 layers this seems to of done satisfactorily she has not had any problems. I cannot see that she is actually had an x-ray ====== 11/06/16 the patient comes in for evaluation of her right lower extremity ulcers. She was here in January 2018 for 2 visits subsequently ended up in the hospital with pneumonia and then to rehabilitation. She has now been discharged from rehabilitation and is home. She has multiple ulcerations to her right lower extremity  including the foot and toes. She does have home health in place and they have been placing alginate to the ulcers. She is followed by Dr. Bridgett Larsson of vascular medicine. 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 She recently saw him on 3/23. In office ABIs were: Right 0.48 with monophasic flow to the DP, PT, peroneal Left 0.63 with monophasic flow to the DP and PT Her arterial studies indicated a patent right below knee popliteal to peroneal bypass She had an MRI in February 2008 that was negative frosty myelitis but this showed general soft tissue edema in the right foot and lower extremity concerning for cellulitis She is a diabetic, managed with insulin. Her hemoglobin A1c in December 2017 was 8.4 which is a trend up from previous levels. She had blood work in February 2018 which revealed an albumin of 2.6 this appears to be relatively acute as an albumin in November 2017 was 3.7 11/13/16; this is a patient I have not seen since February who is readmitted to our clinic last week. 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 Bridgett Larsson. I have reviewed Dr. Lianne Moris notes from March/23/18. Doppler ABI on that date showed an ABI on the right of 0.48 and on the left of 0.63. Dorsalis pedis waveforms were monophasic bilaterally. There was no waveforms detected at the posterior tibial on the right, monophasic on the left. Dr. Lianne Moris comments were that this patient would have follow-up vascular studies in 3 months including ABIs and right lower [name], [name] ([id number]) extremity arterial duplex. She had an MRI in February that was negative for osteomyelitis but showed generalized edema in the foot. Last albumin I see was  in January at 3.4 we have been using Santyl to the 4 wounds on the right leg the patient is noted today to have widespread edema well up towards her groin  this is pitting 2-3+. 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 Normal ejection fraction. 11/20/16; patient has a follow-up appointment with Dr. Bridgett Larsson on April 23. She is still complaining of a lot of pain in the right foot and right leg. It is not clear to me that this is at all positional however I think it is clear claudication with minimal activity perhaps at rest. At our suggestion she is return to her primary physician's office tomorrow with regards to her pitting lower extremity bilateral edema that I reviewed in detail last week 11/27/16; the follow-up with Dr. Bridgett Larsson was actually on May 23 on April 23 as I stated in my note last week. N/A case all of her wounds seems somewhat smaller. The 2 on the right leg are definitely smaller. The areas on the dorsal right first toe, right third toe and the lateral part of the right fifth metatarsal head all looks smaller but have tightly adherent surfaces. We have been using Santyl 12/04/16; follow-up with Dr. Bridgett Larsson on May 23. 2 small open wounds on the right leg continue to get smaller. The area on the right third total lateral aspect of the right fifth toe also look better. The remaining area on the dorsal first toe still has some depth to it. We have been using Santyl to the toes and collagen on the right 12/11/16; according to patient's husband the follow-up with Dr. Bridgett Larsson is not until July. All of the wounds on the right leg are measuring smaller. We have been using some combination of Prisma and Santyl although I think we can go to straight Prisma today. There may have been some confusion with home health about the primary dressing orders here. 12/25/16; the patient has had some healing this week. The area on her right lateral fifth metatarsal head, right third toe are both healed lower right leg is healed. In the vein harvest site superiorly she has one superficial open area. On the dorsal aspect  of her right great toe/MTP joint the wound is now divided into 2 however the proximal area is deep and there is palpable bone 01/01/17; still an open area in the middle of her original right surgical scar. The area on the right third toe and right lateral fifth metatarsal head remained closed. Problematic area on the dorsal aspect of the toe. Previous surgery in this area line 01/08/17 small open area on the original scar on her upper anterior leg although this is closing. X-ray I did of the right first toe did not show underlying bony abnormality. Still this area on the dorsal first toe probes to bone. We have been using Prisma 01/15/17; small open area in the original scar in the upper anterior leg is almost fully closed. She has 2 open areas over the dorsal aspect of the right first toe that probes to bone. Used and a form starting last week. Vigorous bone scraping that I did last week showed few methicillin sensitive staph aureus. She is allergic to penicillin and sulfa drugs. I'm going to give her 2 weeks of doxycycline. She will need an MRI with contrast. 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 She continues  on doxycycline for a bone scraping that showed methicillin sensitive staph aureus [allergic to penicillin and sulfa]. We have been using Endo form to the wound. 01/29/17; surprisingly her MRI did not show osteomyelitis. She continues on doxycycline for a bone scraping that showed methicillin sensitive staph aureus with allergies to penicillin and sulfa. We have been using Endo form to the wound. Unfortunately I cannot get a surface on this visit looks like it is able to support a healing state. Proximally there is still exposed bone. There is no overt soft tissue tenderness. Her MRI did show a previous fracture was surgery to this area but no hardware 02/12/17 on evaluation today patient appears to be doing well in  regard to her lower extremity wound. She does have some mild discomfort but this is minimal. There's no evidence of infection. Her left great toe nail has somewhat been lifted up and there is a little bit of slight bleeding underneath but this is still firmly attached. 02/19/17; she ran out of doxycycline 2 days ago. Nevertheless I would like to continue this for 2 weeks to make a full 6 weeks of therapy as the bone scraping that I did from the open area on the dorsal right first toe showed MRSA. This should complete antibiotic therapy. 02/26/17; the patient is completing her 6 weeks of oral doxycycline. Deep wound on the dorsal right toe. This still has some exposed bone proximally. She does have a reasonably granulated surface albeit thin over bone. I've been using Endo form have made application for an amniotic skin sub 03/05/17; the patient has completed 6 weeks of doxycycline. This is a deep wound on the dorsal right toe which has exposed bone proximally. She has granulation over most of this wound albeit a thin layer. I've been using Endo form. Still do not have approval for [employer] 03/13/17 on evaluation today patient's right foot wound appears to be doing better measurement wise compared to her last evaluation. She has been tolerating the dressing change without complication and does have a appointment with the dietary that has been made as far as referral is concerned there just waiting for her and transportation to contact them back for an actual date. Nonetheless patient has been having less pain at this point. No fevers, chills, nausea, or vomiting noted at this time. 03/26/17; linear wound over the dorsal aspect of her right great toe. At one point this had exposed bone on the most superior aspect however I am pleased to see today that this appears to have a surface of granulation. Apparently product was applied for but denied by [facility]. We'll need to see what that was. The patient has  known PAD status post revascularization. MRI did not show osteomyelitis which was done in June Jarnigan, Merissa V. ([id number]) 04/02/17; continued improvement using Endoform. She was approved for [employer] but hasn't over $762 co-pay per application, this is beyond her means 04/09/17; continues on Endoform. 04/16/17; appears to be doing nicely continuing on Endoform 04/22/17; patient did not have her dressing changed all week because of the weather. Using Endoform. Base of the wound looks healthy elbow there appears to events surrounding maceration perhaps because of the drainage was not changing the wound dressing 04/30/17 wound today continues to close and except for a small divot on the superior part of the wound. This area did not probe the bone but I found this a little concerning as this was the area with exposed bone before we be able to get  this to granulate forward. As I remember things she is not a candidate for skin substitutes secondary to an outlandish co-pay. I asked her husband to look into the out of pocket max for medications if he has 1 [Regranex] or totally for skin substitutes 05/07/17; the patient arrives today with a wound roughly the same size although under careful inspection under the light there appears to be some epithelialization. Her intake nurse noted that the Endoform seemed to be placed over the wound rather than in the deeper divots they could really benefit from the Endoform. At this point I have no plans to change the Endoform as at one point this was at least 33% exposed bone and the rest of the wound very close to the underlying phalanx 05/14/17; no major change in the size or appearance of the wound. We have been using Endoform for a prolonged period of time with reasonably good improvement in the epithelialization i.e. no exposed bone especially proximally. Unless we've not made a lot of changes in the last several weeks. She does not complain of pain. She was  revascularized early this year or perhaps late last year by Dr. Bridgett Larsson and I wonder if he will need to have a look at her, I will need to review the actual vascular procedure which I think was a distal popliteal bypass 05/21/17; switched to either for a blue last week. Patient arrived in clinic with the wound not measuring any better but the surface looking some better. Unfortunately she had some surface slough and when I went to remove this superiorly she had exposed bone. Previously she had had exposed bone in the inferior part of the wound however this is granulated over some weeks ago. Clearly this is a major step back for her. With some difficulty I was able to obtain a piece of the bone probing through the superior part of the wound for culture. We did not send pathology. It is likely that she is going to need imaging of this site but I did not order this today 05/28/17; bone culture I took last week showed MSSA. She still has exposed bone however I could not get another piece for pathology. She had an MRI 4 months ago that did not show osteoarthritic at time. Wound rapidly deteriorated superiorly and I suspect this is underlying osteomyelitis. We have been using Hydrofera Blue 06/04/17 on evaluation today patient's wound appears to actually be doing a little bit better visually compared to last week's evaluation which is good news. Nonetheless she does have osteomyelitis of the toe which does not appear to be MRSA. No fevers, chills, nausea, or vomiting noted at this time. She is having no discomfort. 06/11/17; patient's wound looks a little healthier than I remember seeing it 2 weeks ago. There is no exposed bone and the surface of the wound appears well granulated. We've been using hydrofera blue. I note that she was started on doxycycline which was MSSA. Her appointment with Dr. Ola Spurr is on November 12 I believe. 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  Vallarie Mare of vascular surgery on 11/9. He offered right leg angiography possible intervention however the patient refused. I've discussed this with the patient's husband today she did not want any further procedures. Also noteworthy that Dr. Vallarie Mare stated that this wound had not healed in 3 years, I was not aware of this degree of chronicity in this area. She has underlying osteomyelitis by bone culture. Culture of this grew MSSA,  she is allergic to penicillin and therefore not a candidate for beta lactams. She saw Dr. Ola Spurr of infectious disease this morning, he continued her on doxycycline for another month and apparently sent in a prescription. We have been using Hydrofera Blue. ABI's update by Dr. Bridgett Larsson right 0.62, left 0.89. Monophasic wave forms 06/25/17 on evaluation today patient appears to be doing well in regard to her right great toe wound. She is still taking the doxycycline as prescribed by Dr. Ola Spurr. The Hydrofera Blue Dressing's to be doing as well as anything has for her up to this point and we are still waiting on an insurance authorization for the Oriental. She is having no pain which is good news. No fevers, chills, nausea, or vomiting noted at this time. 07/02/17; still taking doxycycline as prescribed by Dr. Ola Spurr. Hydrofera Blue continues. We have no palpable bone. Still have not had had approval of Apligraf 08/06/16; the patient arrives today with [facility] even though we apparently had changed to Sorbact last time. Her co-pay for Regranex is $389 in discussion with the patient and her husband this was excessive. We'll continue with the Sorbact and standard dressings for now 08/20/17; we have been using sorbact. The wound bed still requires debridement. Wound measurements are slightly better. 09/03/17;using Sorbact.no debridement today. Wound measurements slightly better. There is some discoloration of the tip of her toelooks like bruising 09/17/17; using Sorbact. No  debridement today. Wound dimensions are about the same. Still some discoloration at the tip of her great toe. This does not look any worse than last time [name], [name] ([id number]) 10/01/17; using sorbact right great toe I thought she might have surface epithelialization last time although it is certainly not look like that today nevertheless her dimensions are better. Continued surface eschar on the tip of her toe but this is not progressing. She is actually complaining of the left great toe 10/15/17; this is a very difficult area on the dorsal aspect of her proximal right great toe. At one point this wound had exposed bone however we have managed to get some degree of epithelialization. She was revascularized by Dr. Vallarie Mare in [place]. She saw Dr. Ola Spurr for underlying osteomyelitis. She is not a candidate for advanced treatment options [insurance issues]. She comes in today with a new area on the tip of her right great toe. The exact history here is unclear. We have been using sorbact 10/29/17; patient arrives today with very significant bilateral increase edema which appears to extend into the thighs. This is tight and not particularly pitting however it looks a lot more impressive than I remember. Not surprisingly the wounds on her right great toe have increased in size with weeping edema fluid as the edema extends into the dorsal foot itself. She also has a wound on the tip of her right great toe which is a new wound from 2 weeks ago. We have been using sorbact. 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 He is on Demadex presumably twice a day at 20 mg. I have asked her family to make her an appointment with her primary physicians at the [facility] to go over this degree of edema. This is causing I think deterioration in the right great toe wound. She also has significant PAD and is status post right leg  bypass graft. I think this was done by [facility]. I believe they also said that there  was nothing further that could be done with regards to her vascular status. 11/12/17; patient is going to see her primary doctor this afternoon with regards to lower extremity edema. She has 2 open wounds on the right great toe the original wound on the dorsal proximal part and then a more necrotic looking area on the tip of her right great toe. We took some time today to review her vascular status. The patient had a relate below knee popliteal to peroneal artery bypass with reversed greater saphenous vein on 06/13/17; she also had endarterectomy of the midsegment of the peroneal artery. 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 I think at some point in time after that she was offered an arteriogram on the right but she declined. I've used Silver collagen to her wounds recently. 11/26/17; patient has 2 open wounds on the right great toe tip as well as the dorsal aspect of her right great toe. She has known PAD. I've been trying to get her back to see vein and vascular in [facility] to see if there are options for endovascular surgery to help with perfusion although the patient and her husband may not want any more surgery. We've been using silver alginate because of maceration. She does not have an option for advanced treatment/skin substitutes 12/10/17; continued open wounds much the same as 2 weeks ago on the dorsal aspect of the right great toe on the right great toe tip. I've encouraged to follow-up with Dr. Bridgett Larsson of vein and vascular to explore the possibility that there may be correctable ischemia involved in nonhealing these areas. We made considerable progress on the dorsal right great toe however it is stalled recently. We do not have an advanced treatment  option. Originally this had exposed bone however there is a granulated base. 12/24/17; patient went back to see vein and vascular in [place]. She was seen by Manus Gunning NP. ABIs on the right were noted to be 0.84 and TBI of the right and 0.62. On the left her ABI was 0.82 and her TBI of 0.75. Waveforms were monophasic at the posterior tibial and dorsalis pedis bilaterally. 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 Her husband reiterates today that she does not want an arteriogram therefore I'm not going to press the issue. We've been using silver collagen to the wounds on the tip of the right great toe and dorsal surface of the right great toe. 01/07/18; patient arrives today with the tip of her toe healed. An cerebral amount of slough over the wound on the dorsal toe. As noted on 12/24/17 no options currently for revascularization the patient will not allow an arteriogram Objective [name], [name] ([id number]) Constitutional Sitting or standing Blood Pressure is within target range for patient.. Pulse regular and within target range for patient.[name] Respirations regular, non-labored and within target range.. Temperature is normal and within the target range for the patient.[name] appears in no distress. Vitals Time Taken: 10:25 AM, Height: 64 in, Weight: 200 lbs, BMI: 34.3, Temperature: 97.8 F, Pulse: 72 bpm, Respiratory Rate: 18 breaths/min, Blood Pressure: 136/46 mmHg. General Notes: wound exam The original wound on the dorsal aspect of the right great toe again has a necrotic  surface. Using a #3 curette. I removed adherent necrotic subcutaneous debris. Hemostasis with direct pressure. The area on the tip of the toe actually is closed, somewhat of a surprising development but pleasantly so. There is no evidence of  infection in either area Integumentary (Hair, Skin) Wound #11 status is Healed - Epithelialized. Original cause of wound was Gradually Appeared. The wound is located on the Right Toe Great. The wound measures 0cm length x 0cm width x 0cm depth; 0cm^2 area and 0cm^3 volume. Wound #7 status is Open. Original cause of wound was Gradually Appeared. The wound is located on the Right,Dorsal Metatarsal head first. The wound measures 2.3cm length x 1cm width x 0.3cm depth; 1.806cm^2 area and 0.542cm^3 volume. 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 There was a Excisional Skin/Subcutaneous Tissue Debridement with a total area of 2.3 sq cm performed by Ricard Dillon, MD. With the following instrument (s): Curette to remove Viable and Non-Viable tissue/material. Material removed includes Subcutaneous Tissue and Slough and after achieving pain control using Other (lidocaine 4%). No specimens were taken. A time out was conducted at 11:10, prior to the start of the procedure. A Large amount of bleeding was controlled with Pressure. The procedure was tolerated well with a pain level of 0 throughout and a pain level of 0 following the procedure. Patient s Level of Consciousness  post procedure was recorded as Awake and Alert. Post Debridement Measurements: 2.3cm length x 1cm width x 0.3cm depth; 0.542cm^3 volume. [name], [name] GLENDENING ([id number]) Character of Wound/Ulcer Post Debridement is stable. Severity of Tissue Post Debridement is: Fat layer exposed. Post procedure Diagnosis Wound #7: Same as Pre-Procedure Plan Wound Cleansing: Wound #7 Right,Dorsal Metatarsal head first: Clean wound with Normal Saline. Anesthetic (add to Medication List): Wound #7 Right,Dorsal Metatarsal head first: Topical Lidocaine 4% cream applied to wound bed prior to debridement (In Clinic Only). Primary Wound Dressing: Wound #7 Right,Dorsal Metatarsal head first: Medihoney gel Secondary Dressing: Wound #7 Right,Dorsal Metatarsal head first: ABD and Kerlix/Conform Dressing Change Frequency: Wound #7 Right,Dorsal Metatarsal head first: Change Dressing Monday, Wednesday, Friday Follow-up Appointments: Wound #7 Right,Dorsal Metatarsal head first: Return Appointment in 2 weeks. Edema Control: Wound #7 Right,Dorsal Metatarsal head first: Elevate legs to the level of the heart and pump ankles as often as possible Additional Orders / Instructions: Wound #7 Right,Dorsal Metatarsal head first: Increase protein intake. Activity as tolerated Home Health: Wound #7 Right,Dorsal Metatarsal head first: Tea Visits - [facility] twice weekly, [facility] Continue Home Health Visits - [facility] twice weekly, [facility] Home Health Nurse may visit [facility] to address patient s wound care needs. Home Health Nurse may visit [facility] to address patient s wound care needs. FACE TO FACE ENCOUNTER: MEDICARE and MEDICAID PATIENTS: I certify that this patient is under my care and that I had a face-to-face encounter that meets the  physician face-to-face encounter requirements with this patient on this date. The encounter with the patient was in whole or in part for the  following MEDICAL CONDITION: (primary reason for [place]) MEDICAL NECESSITY: I certify, that based on my findings, NURSING services are a medically necessary home health service. HOME BOUND STATUS: I certify that my clinical findings support that this patient is homebound (i.e., Due to illness or injury, pt requires aid of supportive devices such as crutches, cane, wheelchairs, walkers, the use of special transportation or the assistance of another person to leave their place of residence. There is a normal inability to leave the home and doing so requires considerable and taxing effort. Other absences are for medical reasons / religious services and are infrequent or of short duration when for other reasons). FACE TO FACE ENCOUNTER: MEDICARE and MEDICAID PATIENTS: I certify that this patient is under my care and that I had a face-to-face encounter that meets the physician face-to-face encounter requirements with this patient on this date. The encounter with the patient was in whole or in part for the following MEDICAL CONDITION: (primary reason for [place]) MEDICAL NECESSITY: I certify, that based on my findings, NURSING services are a medically necessary home health service. HOME BOUND STATUS: I certify that my clinical findings support that this patient is homebound (i.e., Due to illness or injury, pt requires aid of supportive devices such as crutches, cane, wheelchairs, walkers, the use of special transportation or the assistance of another person to leave their place of residence. There is a normal inability to leave the home and doing so requires considerable and taxing effort. Other absences are for medical reasons / religious services and are infrequent or of short duration when for other reasons). Burkes, [name] V. ([id number]) If current dressing causes regression in wound condition, may D/C ordered dressing product/s and apply Normal Saline Moist Dressing daily until next  [place] / Other MD appointment. [place] of regression in wound condition at [phone]. If current dressing causes regression in wound condition, may D/C ordered dressing product/s and apply Normal Saline Moist Dressing daily until next [place] / Other MD appointment. [place] of regression in wound condition at [phone]. Please direct any NON-WOUND related issues/requests for orders to patient's Primary Care Physician Please direct any NON-WOUND related issues/requests for orders to patient's Primary Care Physician #1 recurrent debridements of the wound. #2 she is allergic to iodine and therefore cannot have Iodoflex #3 I'm going to use medical honey on this area. We've already tried sorbact. Not sure about Santyl #4 the area on the tip of the toe has healed therefore must have enough perfusion to heal this. #5 with a numerous attempts at advanced treatment options, the per application co-pay is simply exorbitant. I've asked her husband to keep an eye on his out-of-pocket annual max and let us know if they approach this Electronic Signature(s) Signed: 01/07/2018 6:13:14 PM By: Linton Ham MD Entered By: Linton Ham on 01/07/2018 11:33:35 [place], [place]. ([id number]) -------------------------------------------------------------------------------- SuperBill Details Patient Name: Rizor, Jinnifer V. Date of Service: 01/07/2018 Medical Record Number: [id number] Patient Account Number: [id number] Date of Birth/Sex: Mar 17, 1941 (77 y.o. 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 I70.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, right ankle and foot Facility Procedures CPT4 Code Description: [id number] 11042 - DEB SUBQ TISSUE 20 SQ CM/< ICD-10 Diagnosis Description L97.519 Non-pressure chronic ulcer of other part of right foot with unspe Modifier: cified severit Quantity: 1 y Physician Procedures CPT4 Code Description: [id number] 11042 - WC PHYS SUBQ TISS 20 SQ CM ICD-10 Diagnosis Description L97.519 Non-pressure chronic ulcer of other part of right foot with unspec Modifier: ified severit Quantity: 1 y Engineer, maintenance) Signed: 01/07/2018 6:13:14 PM By: Linton Ham MD Entered By: Linton Ham on 01/07/2018 11:34:10

## 2018-01-09 NOTE — Progress Notes (Signed)
ASSUNTA, PUPO ([id number]) Visit Report for 01/07/2018 Arrival Information Details Patient Name: [name], [name] Date of Service: 01/07/2018 10:30 AM Medical Record Number: [id number] Patient Account Number: [id number] Date of Birth/Sex: [birth year] (77 y.o. F) Treating RN: Roger Shelter Primary Care Keiyana Stehr: Beverlyn Roux Other Clinician: Referring Mikael Debell: Beverlyn Roux Treating Tedford Berg/Extender: Tito Dine in Treatment: 38 Visit Information History Since Last Visit All ordered tests and consults were completed: No Patient Arrived: Wheel Chair Added or deleted any medications: No Arrival Time: 10:23 Any new allergies or adverse reactions: No Accompanied By: husband Had a fall or experienced change in No activities of daily living that may affect Transfer Assistance: None risk of falls: Patient Identification Verified: Yes Signs or symptoms of abuse/neglect since last visito No Secondary Verification Process Completed: Yes Hospitalized since last visit: No Patient Requires Transmission-Based No Implantable device outside of the clinic excluding No Precautions: cellular tissue based products placed in the center Patient Has Alerts: No since last visit: Pain Present Now: No Electronic Signature(s) Signed: 01/07/2018 5:08:02 PM By: Roger Shelter Entered By: Roger Shelter on 01/07/2018 10:24:01 [place], [place] [name] ([id number]) -------------------------------------------------------------------------------- Encounter Discharge Information Details Patient Name: [name], [name] Date of Service: 01/07/2018 10:30 AM Medical Record Number: [id number] Patient Account Number: [id number] Date of Birth/Sex: 07-13-1941 (77 y.o. F) Treating RN: Cornell Barman Primary Care Lucillie Kiesel: Beverlyn Roux Other Clinician: Referring Mayling Aber: Beverlyn Roux Treating Eulalie Speights/Extender: Tito Dine in Treatment: 50 Encounter Discharge Information Items Discharge Condition:  Stable Ambulatory Status: Wheelchair Discharge Destination: Home Transportation: Private Auto Accompanied By: husband Schedule Follow-up Appointment: Yes Clinical Summary of Care: Electronic Signature(s) Signed: 01/08/2018 2:15:42 PM By: Gretta Cool, BSN, RN, CWS, Kim RN, BSN Entered By: Gretta Cool, BSN, RN, CWS, Kim on 01/07/2018 11:25:23 Kovar, [name] ([id number]) -------------------------------------------------------------------------------- Lower Extremity Assessment Details Patient Name: [name], [name] Date of Service: 01/07/2018 10:30 AM Medical Record Number: [id number] Patient Account Number: [id number] Date of Birth/Sex: 1941/07/30 (77 y.o. F) Treating RN: Roger Shelter Primary Care Earnest Thalman: Beverlyn Roux Other Clinician: Referring Kepler Mccabe: Beverlyn Roux Treating Breken Nazari/Extender: Tito Dine in Treatment: 61 Edema Assessment Assessed: [Left: No] [Right: No] [Left: Edema] [Right: :] Calf Left: Right: Point of Measurement: 35 cm From Medial Instep cm 46 cm Ankle Left: Right: Point of Measurement: 12 cm From Medial Instep cm 32.7 cm Vascular Assessment Claudication: Claudication Assessment [Right:None] Pulses: Dorsalis Pedis Palpable: [Right:No] Doppler Audible: [Right:Yes] Posterior Tibial Palpable: [Right:No] Doppler Audible: [Right:Inaudible] Extremity colors, hair growth, and conditions: Extremity Color: [Right:Hyperpigmented] Hair Growth on Extremity: [Right:No] Temperature of Extremity: [Right:Warm] Capillary Refill: [Right:< 3 seconds] Toe Nail Assessment Left: Right: Thick: Yes Discolored: Yes Deformed: Yes Improper Length and Hygiene: Yes Electronic Signature(s) Signed: 01/07/2018 5:08:02 PM By: Roger Shelter Entered By: Roger Shelter on 01/07/2018 10:42:37 Houchin, Brisa V. ([id number]) -------------------------------------------------------------------------------- Multi Wound Chart Details Patient Name: [name], [name] Date of Service:  01/07/2018 10:30 AM Medical Record Number: [id number] Patient Account Number: [id number] Date of Birth/Sex: [birth year] (77 y.o. 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] [N/A:N/A] Date Acquired: [11:10/15/2017] [7:08/06/2013] [N/A:N/A]  Weeks of Treatment: [11:12] [7:61] [N/A:N/A] Wound Status: [11:Healed - Epithelialized] [7:Open] [N/A:N/A] Measurements L x W x D [11:0x0x0] [7:2.3x1x0.3] [N/A:N/A] (cm) Area (cm) : [11:0] [7:1.806] [N/A:N/A] Volume (cm) : [11:0] [7:0.542] [N/A:N/A] % Reduction in Area: [11:100.00%] [7:-53.30%] [N/A:N/A] % Reduction in Volume: [11:100.00%] [7:-53.50%] [N/A:N/A] Classification: [11:Grade 2] [7:Grade 2] [N/A:N/A] Debridement: [11:N/A] [7:Debridement - Excisional] [N/A:N/A] Pre-procedure [11:N/A] [7:11:10] [N/A:N/A] Verification/Time Out Taken: Pain Control: [11:N/A] [7:Other] [N/A:N/A] Tissue Debrided: [11:N/A] [7:Subcutaneous, Slough] [N/A:N/A] Level: [11:N/A] [7:Skin/Subcutaneous Tissue] [N/A:N/A] Debridement Area (sq cm): [11:N/A] [7:2.3] [N/A:N/A] Instrument: [11:N/A] [7:Curette] [N/A:N/A] Bleeding: [11:N/A] [7:Large] [N/A:N/A] Hemostasis Achieved: [11:N/A] [7:Pressure] [N/A:N/A] Procedural Pain: [11:N/A] [7:0] [N/A:N/A] Post Procedural Pain: [11:N/A] [7:0] [N/A:N/A] Debridement Treatment [11:N/A] [7:Procedure was  tolerated well] [N/A:N/A] Response: Post Debridement [11:N/A] [7:2.3x1x0.3] [N/A:N/A] Measurements L x W x D (cm) Post Debridement Volume: [11:N/A] [7:0.542] [N/A:N/A] (cm) Periwound Skin Texture: [11:No Abnormalities Noted] [7:No Abnormalities Noted] [N/A:N/A] Periwound Skin Moisture: [11:No Abnormalities Noted] [7:No Abnormalities Noted] [N/A:N/A] Periwound Skin Color: No Abnormalities Noted No Abnormalities Noted N/A Tenderness on Palpation: No No N/A Procedures Performed: N/A Debridement N/A Treatment Notes Wound #7 (Right, Dorsal Metatarsal head first) 1. Cleansed with: Clean wound with Normal Saline 2. Anesthetic Topical Lidocaine 4% cream to wound bed prior to debridement 4. Dressing Applied: Medihoney Gel 5. Secondary Dressing Applied ABD Pad Kerlix/Conform 7. Secured with Tape Notes darko shoe, heel cup Electronic Signature(s) Signed: 01/07/2018 6:13:14 PM By: Linton Ham MD Entered By: Linton Ham on 01/07/2018 11:28:45 Mullane, [name] ([id number]) -------------------------------------------------------------------------------- Multi-Disciplinary Care Plan Details Patient Name: [name], [name] Date of Service: 01/07/2018 10:30 AM Medical Record Number: [id number] Patient Account Number: [id number] Date of Birth/Sex: May 10, 1941 (77 y.o. F) Treating RN: Cornell Barman Primary Care Marcelle Hepner: Beverlyn Roux Other Clinician: Referring Kirklin Mcduffee: Beverlyn Roux Treating Abisai Deer/Extender: Tito Dine in Treatment: 79 Active Inactive ` Abuse / Safety / Falls / Self Care Management Nursing Diagnoses: Impaired physical mobility Potential for falls Goals: Patient will remain injury free Date Initiated: 11/06/2016 Target Resolution Date: 12/30/2016 Goal Status: Active Patient/caregiver will verbalize understanding of skin care regimen Date Initiated: 11/06/2016 Target Resolution Date: 12/30/2016 Goal Status: Active Interventions: Assess fall risk on admission  and as needed Treatment Activities: Patient referred to home care : 11/06/2016 Notes: ` Nutrition Nursing Diagnoses: Potential for alteratiion in Nutrition/Potential for imbalanced nutrition Goals: Patient/caregiver verbalizes understanding of need to maintain therapeutic glucose control per primary care physician Date Initiated: 11/06/2016 Target Resolution Date: 12/30/2016 Goal Status: Active Interventions: Provide education on elevated blood sugars and impact on wound healing Notes: ` Orientation to the Wound Care Program Nursing Diagnoses: Knowledge deficit related to the wound healing center program CLARITZA, JULY ([id number]) Goals: Patient/caregiver will verbalize understanding of the [facility] Date Initiated: 11/06/2016 Target Resolution Date: 12/30/2016 Goal Status: Active Interventions: Provide education on orientation to the wound center Notes: ` Venous Leg Ulcer Nursing Diagnoses: Actual venous Insuffiency (use after diagnosis is confirmed) Knowledge deficit related to disease process and management Goals: Non-invasive venous studies are completed as ordered Date Initiated: 11/06/2016 Target Resolution Date: 12/30/2016 Goal Status: Active Patient/caregiver will verbalize understanding of disease process and disease management Date Initiated: 11/06/2016 Target Resolution Date: 12/30/2016 Goal Status: Active Interventions: Assess peripheral edema status every visit. Notes: ` Wound/Skin Impairment Nursing Diagnoses: Impaired tissue integrity Knowledge deficit related to smoking impact on wound healing Knowledge deficit related to ulceration/compromised skin integrity Goals: Ulcer/skin breakdown will heal within 14 weeks Date Initiated: 11/06/2016 Target Resolution Date: 02/05/2017 Goal Status: Active Interventions: Assess  ulceration(s) every visit Treatment Activities: Skin care regimen initiated : 11/06/2016 Notes: Electronic  Signature(s) Signed: 01/08/2018 2:15:42 PM By: Gretta Cool, BSN, RN, CWS, Kim RN, BSN [name], [name] ([id number]) Entered By: Gretta Cool, BSN, RN, CWS, Kim on 01/07/2018 11:09:56 Fleagle, [name] ([id number]) -------------------------------------------------------------------------------- Pain Assessment Details Patient Name: Trovato, Reylynn V. Date of Service: 01/07/2018 10:30 AM Medical Record Number: [id number] Patient Account Number: [id number] Date of Birth/Sex: 05-08-1941 (77 y.o. F) Treating RN: Roger Shelter Primary Care Tomika Eckles: Beverlyn Roux Other Clinician: Referring Babacar Haycraft: Beverlyn Roux Treating Zarie Kosiba/Extender: Tito Dine in Treatment: 50 Active Problems Location of Pain Severity and Description of Pain Patient Has Paino No Site Locations Pain Management and Medication Current Pain Management: Electronic Signature(s) Signed: 01/07/2018 5:08:02 PM By: Roger Shelter Entered By: Roger Shelter on 01/07/2018 10:25:27 Caraher, Lulla [name] ([id number]) -------------------------------------------------------------------------------- Patient/Caregiver Education Details Patient Name: [name], [name] Date of Service: 01/07/2018 10:30 AM Medical Record Number: [id number] Patient Account Number: [id number] Date of Birth/Gender: [birth year] (77 y.o. F) Treating RN: Cornell Barman Primary Care Physician: Beverlyn Roux Other Clinician: Referring Physician: Beverlyn Roux Treating Physician/Extender: Tito Dine in Treatment: 27 Education Assessment Education Provided To: Patient Education Topics Provided Wound/Skin Impairment: Handouts: Caring for Your Ulcer, Skin Care Do's and Dont's, Other: change dressing as ordered Methods: Demonstration, Explain/Verbal Responses: State content correctly Electronic Signature(s) Signed: 01/08/2018 2:15:42 PM By: Gretta Cool, BSN, RN, CWS, Kim RN, BSN Entered By: Gretta Cool, BSN, RN, CWS, Kim on 01/07/2018 11:25:51 [name], [name]  ([id number]) -------------------------------------------------------------------------------- Wound Assessment Details Patient Name: [name], [name] Date of Service: 01/07/2018 10:30 AM Medical Record Number: [id number] Patient Account Number: [id number] Date of Birth/Sex: Jul 03, 1941 (77 y.o. F) Treating RN: Cornell Barman Primary Care Vidit Boissonneault: Beverlyn Roux Other Clinician: Referring Kyandre Okray: Beverlyn Roux Treating Ricco Dershem/Extender: Tito Dine in Treatment: 61 Wound Status Wound Number: 11 Primary Diabetic Wound/Ulcer of the Lower Etiology: Extremity Wound Location: Right Toe Great Wound Status: Healed - Epithelialized Wounding Event: Gradually Appeared Date Acquired: 10/15/2017 Weeks Of Treatment: 12 Clustered Wound: No Photos Photo Uploaded By: Roger Shelter on 01/07/2018 17:11:28 Wound Measurements Length: (cm) 0 Width: (cm) 0 Depth: (cm) 0 Area: (cm) 0 Volume: (cm) 0 % Reduction in Area: 100% % Reduction in Volume: 100% Wound Description Classification: Grade 2 Periwound Skin Texture Texture Color No Abnormalities Noted: No No Abnormalities Noted: No Moisture No Abnormalities Noted: No Electronic Signature(s) Signed: 01/08/2018 2:15:42 PM By: Gretta Cool, BSN, RN, CWS, Kim RN, BSN Entered By: Gretta Cool, BSN, RN, CWS, Kim on 01/07/2018 11:09:20 [name], [name] ([id number]) -------------------------------------------------------------------------------- Wound Assessment Details Patient Name: [name], [name] Date of Service: 01/07/2018 10:30 AM Medical Record Number: [id number] Patient Account Number: [id number] Date of Birth/Sex: 08-02-1941 (77 y.o. F) Treating RN: Roger Shelter Primary Care Raegen Tarpley: Beverlyn Roux Other Clinician: Referring Kimberla Driskill: Beverlyn Roux Treating Christie Copley/Extender: Tito Dine in Treatment: 61 Wound Status Wound Number: 7 Primary Diabetic Wound/Ulcer of the Lower Etiology: Extremity Wound Location: Right, Dorsal Metatarsal  head first Wound Status: Open Wounding Event: Gradually Appeared Date Acquired: 08/06/2013 Weeks Of Treatment: 61 Clustered Wound: No Photos Photo Uploaded By: Roger Shelter on 01/07/2018 17:11:45 Wound Measurements Length: (cm) 2.3 Width: (cm) 1 Depth: (cm) 0.3 Area: (cm) 1.806 Volume: (cm) 0.542 % Reduction in Area: -53.3% % Reduction in Volume: -53.5% Wound Description Classification: Grade 2 Periwound Skin Texture Texture Color No Abnormalities Noted: No No Abnormalities Noted: No Moisture No Abnormalities Noted: No Treatment Notes Wound #7 (Right, Dorsal Metatarsal head first) 1. Cleansed with: Clean  wound with Normal Saline 2. Anesthetic Topical Lidocaine 4% cream to wound bed prior to debridement 4. Dressing Applied: Bookwalter, Kelissa V. ([id number]) Medihoney Gel 5. Secondary Dressing Applied ABD Pad Kerlix/Conform 7. Secured with Tape Notes darko shoe, heel cup Electronic Signature(s) Signed: 01/07/2018 5:08:02 PM By: Roger Shelter Entered By: Roger Shelter on 01/07/2018 10:35:06 [name], [name] ([id number]) -------------------------------------------------------------------------------- Vitals Details Patient Name: [name], [name] V. Date of Service: 01/07/2018 10:30 AM Medical Record Number: [id number] Patient Account Number: [id number] Date of Birth/Sex: 1941-02-19 (77 y.o. F) Treating RN: Roger Shelter Primary Care Montrice Gracey: Beverlyn Roux Other Clinician: Referring Chrystina Naff: Beverlyn Roux Treating Henri Guedes/Extender: Tito Dine in Treatment: 63 Vital Signs Time Taken: 10:25 Temperature (F): 97.8 Height (in): 64 Pulse (bpm): 72 Weight (lbs): 200 Respiratory Rate (breaths/min): 18 Body Mass Index (BMI): 34.3 Blood Pressure (mmHg): 136/46 Reference Range: 80 - 120 mg / dl Electronic Signature(s) Signed: 01/07/2018 5:08:02 PM By: Roger Shelter Entered By: Roger Shelter on 01/07/2018 10:34:12

## 2018-01-14 ENCOUNTER — Ambulatory Visit: Payer: Medicare HMO | Admitting: Vascular Surgery

## 2018-01-21 ENCOUNTER — Encounter: Payer: Medicare HMO | Admitting: Internal Medicine

## 2018-01-21 DIAGNOSIS — E11621 Type 2 diabetes mellitus with foot ulcer: Secondary | ICD-10-CM | POA: Diagnosis not present

## 2018-01-23 NOTE — Progress Notes (Signed)
[name], [name] ([id number]) Visit Report for 01/21/2018 Debridement Details Patient Name: [name], [name] Date of Service: 01/21/2018 10:15 AM Medical Record Number: [id number] Patient Account Number: [id number] Date of Birth/Sex: [birth year] (77 y.o. F) Treating RN: Cornell Barman Primary Care Provider: Beverlyn Roux Other Clinician: Referring Provider: Beverlyn Roux Treating Provider/Extender: Tito Dine in Treatment: 63 Debridement Performed for Wound #7 Right,Dorsal Metatarsal head first Assessment: Performed By: Physician Ricard Dillon, MD Debridement Type: Debridement Severity of Tissue Pre Fat layer exposed Debridement: Pre-procedure Verification/Time Yes - 11:02 Out Taken: Start Time: 11:02 Pain Control: Other : lidocaine 4% Total Area Debrided (L x W): 2.5 (cm) x 0.8 (cm) = 2 (cm) Tissue and other material Viable, Non-Viable, Slough, Subcutaneous, Slough debrided: Level: Skin/Subcutaneous Tissue Debridement Description: Excisional Instrument: Curette Bleeding: Minimum Hemostasis Achieved: Pressure End Time: 11:03 Procedural Pain: 1 Post Procedural Pain: 1 Response to Treatment: Procedure was tolerated well Level of Consciousness: Awake and Alert Post Debridement Measurements of Total Wound Length: (cm) 2.5 Width: (cm) 0.8 Depth: (cm) 0.3 Volume: (cm) 0.471 Character of Wound/Ulcer Post Debridement: Requires Further Debridement Severity of Tissue Post Debridement: Fat layer exposed Post Procedure Diagnosis Same as Pre-procedure Electronic Signature(s) Signed: 01/21/2018 4:07:49 PM By: Linton Ham MD Signed: 01/21/2018 4:19:21 PM By: Gretta Cool, BSN, RN, CWS, Kim RN, BSN Entered By: Linton Ham on 01/21/2018 12:31:36 [name], [name] ([id number]) -------------------------------------------------------------------------------- HPI Details Patient Name: [name], [name] Date of Service: 01/21/2018 10:15 AM Medical Record Number: [id number] Patient  Account Number: [id number] Date of Birth/Sex: 08-23-1940 (77 y.o. F) Treating RN: Cornell Barman Primary Care Provider: Beverlyn Roux Other Clinician: Referring Provider: Beverlyn Roux Treating Provider/Extender: Tito Dine in Treatment: 61 History of Present Illness HPI Description: 08/13/16: This is a 77 year old woman who came predominantly for review of 3 cm in diameter circular wound to the left anterior lateral leg. She was in the ER on 08/01/16 I reviewed their notes. There was apparently pus coming out of the wound at that time and the patient arrived requesting debridement which they don't do in the emergency room. Nevertheless I can't see that they did any x-rays. There were no cultures done. She is a type II diabetic and I a note after the patient was in the clinic that she had a bypass graft from the popliteal to the tibial on the right on 02/28/16. She also had a right greater saphenous vein harvest on the same date for arterial bypass. She is going to have vascular studies including ABIs T ABIs on the right on 08/28/16. The patient's surgery was on 02/28/16 by Dr. Vallarie Mare she had a right below the knee popliteal artery to peroneal artery bypass with reverse greater saphenous vein and an endarterectomy of the mid segment peroneal artery. Postoperatively she had a strong mild monophasic peroneal signal with a pink foot. It would appear that the patient is had some nonhealing in the surgical saphenous vein harvest site on the left leg. Surprisingly looking through [facility] I cannot see much information about this at all. Dr. Lucious Groves notes from 05/29/16 show that the patient's wounds "are not healed" the right first metatarsal wound healed but then opened back up. 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 At that point the patient had a wound VAC to her right calf with regards to  the wounds on her dorsal right toe would appear that these are felt to be  arterial wounds. She has had surgery on the metatarsal phalangeal in 2015 I Dr. Doran Durand secondary to a right metatarsal phalangeal joint fracture. She is apparently had discoloration around this area since then. 08/28/16; patient arrives with her wounds in much the same condition. The linear vein harvest site and the circular wound below it which I think was a blister. She also has to probing holes in her right great toe and a necrotic eschar on the right second toe. Because of these being arterial wounds I reduced her compression from 3-2 layers this seems to of done satisfactorily she has not had any problems. I cannot see that she is actually had an x-ray ====== 11/06/16 the patient comes in for evaluation of her right lower extremity ulcers. She was here in January 2018 for 2 visits subsequently ended up in the hospital with pneumonia and then to rehabilitation. She has now been discharged from rehabilitation and is home. She has multiple ulcerations to her right lower extremity including the foot and toes. She does have home health in place and they have been placing alginate to the ulcers. She is followed by Dr. Bridgett Larsson of vascular medicine. 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 She recently saw him on 3/23. In office ABIs were: Right 0.48 with monophasic flow to the DP, PT, peroneal Left 0.63 with monophasic flow to the DP and PT Her arterial studies indicated a patent right below knee popliteal to peroneal bypass She had an MRI in February 2008 that was negative frosty myelitis but this showed general soft tissue edema in the right foot and lower extremity concerning for cellulitis She is a diabetic, managed with insulin. Her hemoglobin A1c in December 2017 was 8.4 which is a trend up from previous levels. She had blood work in February 2018 which  revealed an albumin of 2.6 this appears to be relatively acute as an albumin in November 2017 was 3.7 11/13/16; this is a patient I have not seen since February who is readmitted to our clinic last week. 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 Bridgett Larsson. I have reviewed Dr. Lianne Moris notes from March/23/18. Doppler ABI on that date showed an ABI on the right of 0.48 and on the left of 0.63. Dorsalis pedis waveforms were monophasic bilaterally. There was no waveforms detected at the posterior tibial on the right, monophasic on the left. Dr. Lianne Moris comments were that this patient would have follow-up vascular studies in 3 months including ABIs and right lower extremity arterial duplex. She had an MRI in February that was negative for osteomyelitis but showed generalized edema in Petitti, Shasta V. ([id number]) the foot. Last albumin I see was in January at 3.4 we have been using Santyl to the 4 wounds on the right leg the patient is noted today to have widespread edema well up towards her groin this is pitting 2-3+. 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 Normal ejection fraction. 11/20/16; patient has a follow-up appointment with Dr. Bridgett Larsson on April 23. She is still complaining of a lot of pain in the right foot and right leg. It is not clear to me that this is at all positional however I think it is clear claudication with minimal activity perhaps at rest. At our suggestion she is return to her primary physician's  office tomorrow with regards to her pitting lower extremity bilateral edema that I reviewed in detail last week 11/27/16; the follow-up with Dr. Bridgett Larsson was actually on May 23 on April 23 as I stated in my note last week. N/A case all of her wounds seems somewhat smaller. The 2 on the right leg are definitely smaller. The areas on the dorsal right first toe, right third toe  and the lateral part of the right fifth metatarsal head all looks smaller but have tightly adherent surfaces. We have been using Santyl 12/04/16; follow-up with Dr. Bridgett Larsson on May 23. 2 small open wounds on the right leg continue to get smaller. The area on the right third total lateral aspect of the right fifth toe also look better. The remaining area on the dorsal first toe still has some depth to it. We have been using Santyl to the toes and collagen on the right 12/11/16; according to patient's husband the follow-up with Dr. Bridgett Larsson is not until July. All of the wounds on the right leg are measuring smaller. We have been using some combination of Prisma and Santyl although I think we can go to straight Prisma today. There may have been some confusion with home health about the primary dressing orders here. 12/25/16; the patient has had some healing this week. The area on her right lateral fifth metatarsal head, right third toe are both healed lower right leg is healed. In the vein harvest site superiorly she has one superficial open area. On the dorsal aspect of her right great toe/MTP joint the wound is now divided into 2 however the proximal area is deep and there is palpable bone 01/01/17; still an open area in the middle of her original right surgical scar. The area on the right third toe and right lateral fifth metatarsal head remained closed. Problematic area on the dorsal aspect of the toe. Previous surgery in this area line 01/08/17 small open area on the original scar on her upper anterior leg although this is closing. X-ray I did of the right first toe did not show underlying bony abnormality. Still this area on the dorsal first toe probes to bone. We have been using Prisma 01/15/17; small open area in the original scar in the upper anterior leg is almost fully closed. She has 2 open areas over the dorsal aspect of the right first toe that probes to bone. Used and a form starting last week. Vigorous  bone scraping that I did last week showed few methicillin sensitive staph aureus. She is allergic to penicillin and sulfa drugs. I'm going to give her 2 weeks of doxycycline. She will need an MRI with contrast. 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 She continues on doxycycline for a bone scraping that showed methicillin sensitive staph aureus [allergic to penicillin and sulfa]. We have been using Endo form to the wound. 01/29/17; surprisingly her MRI did not show osteomyelitis. She continues on doxycycline for a bone scraping that showed methicillin sensitive staph aureus with allergies to penicillin and sulfa. We have been using Endo form to the wound. Unfortunately I cannot get a surface on this visit looks like it is able to support a healing state. Proximally there is still exposed bone. There is no overt soft tissue tenderness. Her MRI did show a previous fracture was surgery to this area but no hardware 02/12/17 on evaluation today patient appears  to be doing well in regard to her lower extremity wound. She does have some mild discomfort but this is minimal. There's no evidence of infection. Her left great toe nail has somewhat been lifted up and there is a little bit of slight bleeding underneath but this is still firmly attached. 02/19/17; she ran out of doxycycline 2 days ago. Nevertheless I would like to continue this for 2 weeks to make a full 6 weeks of therapy as the bone scraping that I did from the open area on the dorsal right first toe showed MRSA. This should complete antibiotic therapy. 02/26/17; the patient is completing her 6 weeks of oral doxycycline. Deep wound on the dorsal right toe. This still has some exposed bone proximally. She does have a reasonably granulated surface albeit thin over bone. I've been using Endo form have made application for an amniotic skin sub 03/05/17; the patient has  completed 6 weeks of doxycycline. This is a deep wound on the dorsal right toe which has exposed bone proximally. She has granulation over most of this wound albeit a thin layer. I've been using Endo form. Still do not have approval for Affinity 03/13/17 on evaluation today patient's right foot wound appears to be doing better measurement wise compared to her last evaluation. She has been tolerating the dressing change without complication and does have a appointment with the dietary that has been made as far as referral is concerned there just waiting for her and transportation to contact them back for an actual date. Nonetheless patient has been having less pain at this point. No fevers, chills, nausea, or vomiting noted at this time. 03/26/17; linear wound over the dorsal aspect of her right great toe. At one point this had exposed bone on the most superior aspect however I am pleased to see today that this appears to have a surface of granulation. Apparently product was applied for but denied by [facility]. We'll need to see what that was. The patient has known PAD status post revascularization. MRI did not show osteomyelitis which was done in June 04/02/17; continued improvement using Endoform. She was approved for Oasis but hasn't over $923 co-pay per application, Gilmer, Katalia V. ([id number]) this is beyond her means 04/09/17; continues on Endoform. 04/16/17; appears to be doing nicely continuing on Endoform 04/22/17; patient did not have her dressing changed all week because of the weather. Using Endoform. Base of the wound looks healthy elbow there appears to events surrounding maceration perhaps because of the drainage was not changing the wound dressing 04/30/17 wound today continues to close and except for a small divot on the superior part of the wound. This area did not probe the bone but I found this a little concerning as this was the area with exposed bone before we be able to get this  to granulate forward. As I remember things she is not a candidate for skin substitutes secondary to an outlandish co-pay. I asked her husband to look into the out of pocket max for medications if he has 1 [Regranex] or totally for skin substitutes 05/07/17; the patient arrives today with a wound roughly the same size although under careful inspection under the light there appears to be some epithelialization. Her intake nurse noted that the Endoform seemed to be placed over the wound rather than in the deeper divots they could really benefit from the Endoform. At this point I have no plans to change the Endoform as at one point this was at least  33% exposed bone and the rest of the wound very close to the underlying phalanx 05/14/17; no major change in the size or appearance of the wound. We have been using Endoform for a prolonged period of time with reasonably good improvement in the epithelialization i.e. no exposed bone especially proximally. Unless we've not made a lot of changes in the last several weeks. She does not complain of pain. She was revascularized early this year or perhaps late last year by Dr. Bridgett Larsson and I wonder if he will need to have a look at her, I will need to review the actual vascular procedure which I think was a distal popliteal bypass 05/21/17; switched to either for a blue last week. Patient arrived in clinic with the wound not measuring any better but the surface looking some better. Unfortunately she had some surface slough and when I went to remove this superiorly she had exposed bone. Previously she had had exposed bone in the inferior part of the wound however this is granulated over some weeks ago. Clearly this is a major step back for her. With some difficulty I was able to obtain a piece of the bone probing through the superior part of the wound for culture. We did not send pathology. It is likely that she is going to need imaging of this site but I did not order  this today 05/28/17; bone culture I took last week showed MSSA. She still has exposed bone however I could not get another piece for pathology. She had an MRI 4 months ago that did not show osteoarthritic at time. Wound rapidly deteriorated superiorly and I suspect this is underlying osteomyelitis. We have been using Hydrofera Blue 06/04/17 on evaluation today patient's wound appears to actually be doing a little bit better visually compared to last week's evaluation which is good news. Nonetheless she does have osteomyelitis of the toe which does not appear to be MRSA. No fevers, chills, nausea, or vomiting noted at this time. She is having no discomfort. 06/11/17; patient's wound looks a little healthier than I remember seeing it 2 weeks ago. There is no exposed bone and the surface of the wound appears well granulated. We've been using hydrofera blue. I note that she was started on doxycycline which was MSSA. Her appointment with Dr. Ola Spurr is on November 12 I believe. 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 Vallarie Mare of vascular surgery on 11/9. He offered right leg angiography possible intervention however the patient refused. I've discussed this with the patient's husband today she did not want any further procedures. Also noteworthy that Dr. Vallarie Mare stated that this wound had not healed in 3 years, I was not aware of this degree of chronicity in this area. She has underlying osteomyelitis by bone culture. Culture of this grew MSSA, she is allergic to penicillin and therefore not a candidate for beta lactams. She saw Dr. Ola Spurr of infectious disease this morning, he continued her on doxycycline for another month and apparently sent in a prescription. We have been using Hydrofera Blue. ABI's update by Dr. Bridgett Larsson right 0.62, left 0.89. Monophasic wave forms 06/25/17 on evaluation today patient appears to be doing well in regard to her right great toe wound. She is  still taking the doxycycline as prescribed by Dr. Ola Spurr. The Hydrofera Blue Dressing's to be doing as well as anything has for her up to this point and we are still waiting on an insurance authorization for the [place]. She is having  no pain which is good news. No fevers, chills, nausea, or vomiting noted at this time. 07/02/17; still taking doxycycline as prescribed by Dr. Ola Spurr. Hydrofera Blue continues. We have no palpable bone. Still have not had had approval of Apligraf 08/06/16; the patient arrives today with [facility] even though we apparently had changed to Sorbact last time. Her co-pay for Regranex is $389 in discussion with the patient and her husband this was excessive. We'll continue with the Sorbact and standard dressings for now 08/20/17; we have been using sorbact. The wound bed still requires debridement. Wound measurements are slightly better. 09/03/17;using Sorbact.no debridement today. Wound measurements slightly better. There is some discoloration of the tip of her toelooks like bruising 09/17/17; using Sorbact. No debridement today. Wound dimensions are about the same. Still some discoloration at the tip of her great toe. This does not look any worse than last time 10/01/17; using sorbact right great toe I thought she might have surface epithelialization last time although it is certainly not [name], [name] ([id number]) look like that today nevertheless her dimensions are better. Continued surface eschar on the tip of her toe but this is not progressing. She is actually complaining of the left great toe 10/15/17; this is a very difficult area on the dorsal aspect of her proximal right great toe. At one point this wound had exposed bone however we have managed to get some degree of epithelialization. She was revascularized by Dr. Vallarie Mare in [place]. She saw Dr. Ola Spurr for underlying osteomyelitis. She is not a candidate for advanced treatment options [insurance  issues]. She comes in today with a new area on the tip of her right great toe. The exact history here is unclear. We have been using sorbact 10/29/17; patient arrives today with very significant bilateral increase edema which appears to extend into the thighs. This is tight and not particularly pitting however it looks a lot more impressive than I remember. Not surprisingly the wounds on her right great toe have increased in size with weeping edema fluid as the edema extends into the dorsal foot itself. She also has a wound on the tip of her right great toe which is a new wound from 2 weeks ago. We have been using sorbact. 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 He is on Demadex presumably twice a day at 20 mg. I have asked her family to make her an appointment with her primary physicians at the [place] [facility] to go over this degree of edema. This is causing I think deterioration in the right great toe wound. She also has significant PAD and is status post right leg bypass graft. I think this was done by [facility]. I believe they also said that there was nothing further that could be done with regards to her vascular status. 11/12/17; patient is going to see her primary doctor this afternoon with regards to lower extremity edema. She has 2 open wounds on the right great toe the original wound on the dorsal proximal part and then a more necrotic looking area on the tip of her right great toe. We took some time today to review her vascular status. The patient had a relate below knee popliteal to peroneal artery bypass with reversed greater saphenous vein on 06/13/17; she also had endarterectomy of the midsegment of the peroneal artery. The patient's course was complicated by a bilateral CVA and was found to have  near occlusion of the left internal carotid artery that required interventional radiology  stenting. I think at some point in time after that she was offered an arteriogram on the right but she declined. I've used Silver collagen to her wounds recently. 11/26/17; patient has 2 open wounds on the right great toe tip as well as the dorsal aspect of her right great toe. She has known PAD. I've been trying to get her back to see vein and vascular in [facility] to see if there are options for endovascular surgery to help with perfusion although the patient and her husband may not want any more surgery. We've been using silver alginate because of maceration. She does not have an option for advanced treatment/skin substitutes 12/10/17; continued open wounds much the same as 2 weeks ago on the dorsal aspect of the right great toe on the right great toe tip. I've encouraged to follow-up with Dr. Bridgett Larsson of vein and vascular to explore the possibility that there may be correctable ischemia involved in nonhealing these areas. We made considerable progress on the dorsal right great toe however it is stalled recently. We do not have an advanced treatment option. Originally this had exposed bone however there is a granulated base. 12/24/17; patient went back to see vein and vascular in [place]. She was seen by Manus Gunning NP. ABIs on the right were noted to be 0.84 and TBI of the right and 0.62. On the left her ABI was 0.82 and her TBI of 0.75. Waveforms were monophasic at the posterior tibial and dorsalis pedis bilaterally. 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 Her husband reiterates today that she does not want an arteriogram therefore I'm not going to press the issue. We've been using silver collagen to the wounds on the tip of the right great toe and dorsal surface of the right great toe. 01/07/18; patient arrives today  with the tip of her toe healed. An cerebral amount of slough over the wound on the dorsal toe. As noted on 12/24/17 no options currently for revascularization the patient will not allow an arteriogram 01/21/18; typical of her right great toe remains healed. Still large amount of slough over the wound on the dorsal toe. No options for revascularization. We've been using Science writer) Signed: 01/21/2018 4:07:49 PM By: Linton Ham MD Entered By: Linton Ham on 01/21/2018 12:32:25 Langenderfer, [name] ([id number]) -------------------------------------------------------------------------------- Physical Exam Details Patient Name: [name], [name] Date of Service: 01/21/2018 10:15 AM Medical Record Number: [id number] Patient Account Number: [id number] Date of Birth/Sex: May 27, 1941 (77 y.o. F) Treating RN: Cornell Barman Primary Care Provider: Beverlyn Roux Other Clinician: Referring Provider: Beverlyn Roux Treating Provider/Extender: Tito Dine in Treatment: 47 Notes wound exam oThe original wound on the dorsal aspect of the right great toe again has a necrotic surface using a #3 curet I remove this. Base of the wound actually looks fairly healthy today. Hemostasis with direct pressure. No evidence of infection. Electronic Signature(s) Signed: 01/21/2018 4:07:49 PM By: Linton Ham MD Entered By: Linton Ham on 01/21/2018 12:38:11 Mogle, [name] ([id number]) -------------------------------------------------------------------------------- Physician Orders Details Patient Name: Dentinger, Sanoe V. Date of Service: 01/21/2018 10:15 AM Medical Record Number: [id number] Patient Account Number: [id number] Date of Birth/Sex: [birth year] (77 y.o. F) Treating RN: Cornell Barman Primary Care Provider: Beverlyn Roux Other Clinician:  Referring Provider: Beverlyn Roux Treating Provider/Extender: Tito Dine in Treatment: 51 Verbal / Phone Orders: No Diagnosis Coding Wound  Cleansing Wound #7 Right,Dorsal Metatarsal head first o Clean wound with Normal Saline. Anesthetic (add to Medication List) Wound #7 Right,Dorsal Metatarsal head first o Topical Lidocaine 4% cream applied to wound bed prior to debridement (In Clinic Only). Primary Wound Dressing Wound #7 Right,Dorsal Metatarsal head first o Medihoney gel Secondary Dressing Wound #7 Right,Dorsal Metatarsal head first o ABD and Kerlix/Conform Dressing Change Frequency Wound #7 Right,Dorsal Metatarsal head first o Change Dressing Monday, Wednesday, Friday Follow-up Appointments Wound #7 Right,Dorsal Metatarsal head first o Return Appointment in 2 weeks. Edema Control Wound #7 Right,Dorsal Metatarsal head first o Elevate legs to the level of the heart and pump ankles as often as possible Additional Orders / Instructions Wound #7 Right,Dorsal Metatarsal head first o Increase protein intake. o Activity as tolerated Home Health Wound #7 Right,Dorsal Metatarsal head first o Continue [facility] Health Visits - [facility] weekly, [facility] o [place] Visits - [facility] twice weekly, [facility]-Wednesday o Home Health Nurse may visit [facility] to address patientos wound care needs. o Home Health Nurse may visit [facility] to address patientos wound care needs. o FACE TO FACE ENCOUNTER: MEDICARE and MEDICAID PATIENTS: I certify that this patient is under my care and that I had a face-to-face encounter that meets the physician face-to-face encounter requirements with this [name], [name] ([id number]) patient on this date. The encounter with the patient was in whole or in part for the following MEDICAL CONDITION: (primary reason for [place]) MEDICAL NECESSITY: I certify, that based on my findings, NURSING services are a medically necessary home health service. HOME BOUND STATUS: I certify that my clinical findings support that this patient is homebound  (i.e., Due to illness or injury, pt requires aid of supportive devices such as crutches, cane, wheelchairs, walkers, the use of special transportation or the assistance of another person to leave their place of residence. There is a normal inability to leave the home and doing so requires considerable and taxing effort. Other absences are for medical reasons / religious services and are infrequent or of short duration when for other reasons). o FACE TO FACE ENCOUNTER: MEDICARE and MEDICAID PATIENTS: I certify that this patient is under my care and that I had a face-to-face encounter that meets the physician face-to-face encounter requirements with this patient on this date. The encounter with the patient was in whole or in part for the following MEDICAL CONDITION: (primary reason for [place]) MEDICAL NECESSITY: I certify, that based on my findings, NURSING services are a medically necessary home health service. HOME BOUND STATUS: I certify that my clinical findings support that this patient is homebound (i.e., Due to illness or injury, pt requires aid of supportive devices such as crutches, cane, wheelchairs, walkers, the use of special transportation or the assistance of another person to leave their place of residence. There is a normal inability to leave the home and doing so requires considerable and taxing effort. Other absences are for medical reasons / religious services and are infrequent or of short duration when for other reasons). o If current dressing causes regression in wound condition, may D/C ordered dressing product/s and apply Normal Saline Moist Dressing daily until next [place] / Other MD appointment. Ethel of regression in wound condition at [phone]. o If current dressing causes regression in wound condition, may D/C ordered  dressing product/s and apply Normal Saline Moist Dressing daily until next [place] /  Other MD appointment. [place] of regression in wound condition at [phone]. o Please direct any NON-WOUND related issues/requests for orders to patient's Primary Care Physician o Please direct any NON-WOUND related issues/requests for orders to patient's Primary Care Physician Electronic Signature(s) Signed: 01/21/2018 4:07:49 PM By: Linton Ham MD Signed: 01/21/2018 4:19:21 PM By: Gretta Cool, BSN, RN, CWS, Kim RN, BSN Entered By: Gretta Cool, BSN, RN, CWS, Kim on 01/21/2018 11:05:00 Grabe, [name] ([id number]) -------------------------------------------------------------------------------- Problem List Details Patient Name: [name], [name] Date of Service: 01/21/2018 10:15 AM Medical Record Number: [id number] Patient Account Number: [id number] Date of Birth/Sex: Jul 30, 1941 (77 y.o. 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 with unspecified 11/06/2016 No Yes severity E11.52 Type 2 diabetes mellitus with diabetic peripheral angiopathy 11/06/2016 No Yes with gangrene 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 Date of Service: 01/21/2018 10:15 AM Medical Record Number: [id number] Patient Account Number: [id number] Date of Birth/Sex: 1941-06-16 (77 y.o. F) Treating RN: Cornell Barman Primary Care Provider: Beverlyn Roux Other Clinician: Referring Provider: Beverlyn Roux Treating Provider/Extender: Tito Dine in Treatment: 63 Subjective History of Present Illness (HPI) 08/13/16: This is a 77 year old woman who came predominantly for review of 3 cm in diameter circular wound to the left anterior lateral leg. She was in the ER on 08/01/16 I reviewed their notes. There was apparently pus coming out of the wound at that time and the patient arrived requesting debridement which they don't do in the emergency room. Nevertheless I can't see that they did any x-rays. There were no cultures done. She is a type II diabetic and I a note after the patient was in the clinic that she had a bypass graft from the popliteal to the tibial on the right on 02/28/16. She also had a right greater saphenous vein harvest on the same date for arterial bypass. She is going to have vascular studies including ABIs T ABIs on the right on 08/28/16. The patient's surgery was on 02/28/16 by Dr. Vallarie Mare she had a right below the knee popliteal artery to peroneal artery bypass with reverse greater saphenous vein and an endarterectomy of the mid segment peroneal artery. Postoperatively she had a strong mild monophasic peroneal signal with a pink foot. It would appear that the patient is had some nonhealing in the surgical saphenous vein harvest site on the left leg. Surprisingly looking through [facility] I cannot see much information about this at all. Dr. Lucious Groves notes from  05/29/16 show that the patient's wounds "are not healed" the  right first metatarsal wound healed but then opened back up. 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 At that point the patient had a wound VAC to her right calf with regards to the wounds on her dorsal right toe would appear that these are felt to be arterial wounds. She has had surgery on the metatarsal phalangeal in 2015 I Dr. Doran Durand secondary to a right metatarsal phalangeal joint fracture. She is apparently had discoloration around this area since then. 08/28/16; patient arrives with her wounds in much the same condition. The linear vein harvest site and the circular wound below it which I think was a blister. She also has to probing holes in her right great toe and a necrotic eschar on the right second toe. Because of these being arterial wounds I reduced her compression from 3-2 layers this seems to of done satisfactorily she has not had any problems. I cannot see that she is actually had an x-ray ====== 11/06/16 the patient comes in for evaluation of her right lower extremity ulcers. She was here in January 2018 for 2 visits subsequently ended up in the hospital with pneumonia and then to rehabilitation. She has now been discharged from rehabilitation and is home. She has multiple ulcerations to her right lower extremity including the foot and toes. She does have home health in place and they have been placing alginate to the ulcers. She is followed by Dr. Bridgett Larsson of vascular medicine. 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 She recently saw him on 3/23. In office ABIs were: Right 0.48 with monophasic flow to the DP, PT, peroneal Left 0.63 with monophasic flow to the DP and PT Her arterial studies indicated a patent right below knee popliteal to peroneal  bypass She had an MRI in February 2008 that was negative frosty myelitis but this showed general soft tissue edema in the right foot and lower extremity concerning for cellulitis She is a diabetic, managed with insulin. Her hemoglobin A1c in December 2017 was 8.4 which is a trend up from previous levels. She had blood work in February 2018 which revealed an albumin of 2.6 this appears to be relatively acute as an albumin in November 2017 was 3.7 11/13/16; this is a patient I have not seen since February who is readmitted to our clinic last week. 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 Bridgett Larsson. I have reviewed Dr. Lianne Moris notes from March/23/18. Doppler ABI on that date showed an ABI on the right of 0.48 and on the left of 0.63. Dorsalis pedis waveforms were monophasic bilaterally. There was no waveforms detected at the posterior tibial on the right, monophasic on the left. Dr. Lianne Moris comments were that this patient would have follow-up vascular studies in 3 months including ABIs and right lower [name], [name] ([id number]) extremity arterial duplex. She had an MRI in February that was negative for osteomyelitis but showed generalized edema in the foot. Last albumin I see was in January at 3.4 we have been using Santyl to the 4 wounds on the right leg the patient is noted today to have widespread edema well up towards her groin this is pitting 2-3+. 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 Normal ejection fraction.  11/20/16; patient has a follow-up appointment with Dr. Bridgett Larsson on April 23. She is still complaining of a lot of pain in the right foot and right leg. It is not clear to me that this is at all positional however I think it is clear claudication with minimal activity perhaps at rest. At our suggestion she is return to her primary physician's office tomorrow with  regards to her pitting lower extremity bilateral edema that I reviewed in detail last week 11/27/16; the follow-up with Dr. Bridgett Larsson was actually on May 23 on April 23 as I stated in my note last week. N/A case all of her wounds seems somewhat smaller. The 2 on the right leg are definitely smaller. The areas on the dorsal right first toe, right third toe and the lateral part of the right fifth metatarsal head all looks smaller but have tightly adherent surfaces. We have been using Santyl 12/04/16; follow-up with Dr. Bridgett Larsson on May 23. 2 small open wounds on the right leg continue to get smaller. The area on the right third total lateral aspect of the right fifth toe also look better. The remaining area on the dorsal first toe still has some depth to it. We have been using Santyl to the toes and collagen on the right 12/11/16; according to patient's husband the follow-up with Dr. Bridgett Larsson is not until July. All of the wounds on the right leg are measuring smaller. We have been using some combination of Prisma and Santyl although I think we can go to straight Prisma today. There may have been some confusion with home health about the primary dressing orders here. 12/25/16; the patient has had some healing this week. The area on her right lateral fifth metatarsal head, right third toe are both healed lower right leg is healed. In the vein harvest site superiorly she has one superficial open area. On the dorsal aspect of her right great toe/MTP joint the wound is now divided into 2 however the proximal area is deep and there is palpable bone 01/01/17; still an open area in the middle of her original right surgical scar. The area on the right third toe and right lateral fifth metatarsal head remained closed. Problematic area on the dorsal aspect of the toe. Previous surgery in this area line 01/08/17 small open area on the original scar on her upper anterior leg although this is closing. X-ray I did of the right first  toe did not show underlying bony abnormality. Still this area on the dorsal first toe probes to bone. We have been using Prisma 01/15/17; small open area in the original scar in the upper anterior leg is almost fully closed. She has 2 open areas over the dorsal aspect of the right first toe that probes to bone. Used and a form starting last week. Vigorous bone scraping that I did last week showed few methicillin sensitive staph aureus. She is allergic to penicillin and sulfa drugs. I'm going to give her 2 weeks of doxycycline. She will need an MRI with contrast. 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 She continues on doxycycline for a bone scraping that showed methicillin sensitive staph aureus [allergic to penicillin and sulfa]. We have been using Endo form to the wound. 01/29/17; surprisingly her MRI did not show osteomyelitis. She continues on doxycycline for a bone scraping that showed methicillin sensitive staph aureus with allergies to penicillin and  sulfa. We have been using Endo form to the wound. Unfortunately I cannot get a surface on this visit looks like it is able to support a healing state. Proximally there is still exposed bone. There is no overt soft tissue tenderness. Her MRI did show a previous fracture was surgery to this area but no hardware 02/12/17 on evaluation today patient appears to be doing well in regard to her lower extremity wound. She does have some mild discomfort but this is minimal. There's no evidence of infection. Her left great toe nail has somewhat been lifted up and there is a little bit of slight bleeding underneath but this is still firmly attached. 02/19/17; she ran out of doxycycline 2 days ago. Nevertheless I would like to continue this for 2 weeks to make a full 6 weeks of therapy as the bone scraping that I did from the open area on the dorsal right first toe showed MRSA. This  should complete antibiotic therapy. 02/26/17; the patient is completing her 6 weeks of oral doxycycline. Deep wound on the dorsal right toe. This still has some exposed bone proximally. She does have a reasonably granulated surface albeit thin over bone. I've been using Endo form have made application for an amniotic skin sub 03/05/17; the patient has completed 6 weeks of doxycycline. This is a deep wound on the dorsal right toe which has exposed bone proximally. She has granulation over most of this wound albeit a thin layer. I've been using Endo form. Still do not have approval for [employer] 03/13/17 on evaluation today patient's right foot wound appears to be doing better measurement wise compared to her last evaluation. She has been tolerating the dressing change without complication and does have a appointment with the dietary that has been made as far as referral is concerned there just waiting for her and transportation to contact them back for an actual date. Nonetheless patient has been having less pain at this point. No fevers, chills, nausea, or vomiting noted at this time. 03/26/17; linear wound over the dorsal aspect of her right great toe. At one point this had exposed bone on the most superior aspect however I am pleased to see today that this appears to have a surface of granulation. Apparently product was applied for but denied by [facility]. We'll need to see what that was. The patient has known PAD status post revascularization. MRI did not show osteomyelitis which was done in June Neighbors, [name] ([id number]) 04/02/17; continued improvement using Endoform. She was approved for [facility] but hasn't over $644 co-pay per application, this is beyond her means 04/09/17; continues on Endoform. 04/16/17; appears to be doing nicely continuing on Endoform 04/22/17; patient did not have her dressing changed all week because of the weather. Using Endoform. Base of the wound looks healthy elbow there  appears to events surrounding maceration perhaps because of the drainage was not changing the wound dressing 04/30/17 wound today continues to close and except for a small divot on the superior part of the wound. This area did not probe the bone but I found this a little concerning as this was the area with exposed bone before we be able to get this to granulate forward. As I remember things she is not a candidate for skin substitutes secondary to an outlandish co-pay. I asked her husband to look into the out of pocket max for medications if he has 1 [Regranex] or totally for skin substitutes 05/07/17; the patient arrives today with a wound  roughly the same size although under careful inspection under the light there appears to be some epithelialization. Her intake nurse noted that the Endoform seemed to be placed over the wound rather than in the deeper divots they could really benefit from the Endoform. At this point I have no plans to change the Endoform as at one point this was at least 33% exposed bone and the rest of the wound very close to the underlying phalanx 05/14/17; no major change in the size or appearance of the wound. We have been using Endoform for a prolonged period of time with reasonably good improvement in the epithelialization i.e. no exposed bone especially proximally. Unless we've not made a lot of changes in the last several weeks. She does not complain of pain. She was revascularized early this year or perhaps late last year by Dr. Bridgett Larsson and I wonder if he will need to have a look at her, I will need to review the actual vascular procedure which I think was a distal popliteal bypass 05/21/17; switched to either for a blue last week. Patient arrived in clinic with the wound not measuring any better but the surface looking some better. Unfortunately she had some surface slough and when I went to remove this superiorly she had exposed bone. Previously she had had exposed bone in  the inferior part of the wound however this is granulated over some weeks ago. Clearly this is a major step back for her. With some difficulty I was able to obtain a piece of the bone probing through the superior part of the wound for culture. We did not send pathology. It is likely that she is going to need imaging of this site but I did not order this today 05/28/17; bone culture I took last week showed MSSA. She still has exposed bone however I could not get another piece for pathology. She had an MRI 4 months ago that did not show osteoarthritic at time. Wound rapidly deteriorated superiorly and I suspect this is underlying osteomyelitis. We have been using Hydrofera Blue 06/04/17 on evaluation today patient's wound appears to actually be doing a little bit better visually compared to last week's evaluation which is good news. Nonetheless she does have osteomyelitis of the toe which does not appear to be MRSA. No fevers, chills, nausea, or vomiting noted at this time. She is having no discomfort. 06/11/17; patient's wound looks a little healthier than I remember seeing it 2 weeks ago. There is no exposed bone and the surface of the wound appears well granulated. We've been using hydrofera blue. I note that she was started on doxycycline which was MSSA. Her appointment with Dr. Ola Spurr is on November 12 I believe. 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 Vallarie Mare of vascular surgery on 11/9. He offered right leg angiography possible intervention however the patient refused. I've discussed this with the patient's husband today she did not want any further procedures. Also noteworthy that Dr. Vallarie Mare stated that this wound had not healed in 3 years, I was not aware of this degree of chronicity in this area. She has underlying osteomyelitis by bone culture. Culture of this grew MSSA, she is allergic to penicillin and therefore not a candidate for beta lactams. She saw Dr.  Ola Spurr of infectious disease this morning, he continued her on doxycycline for another month and apparently sent in a prescription. We have been using Hydrofera Blue. ABI's update by Dr. Bridgett Larsson right 0.62, left 0.89. Monophasic wave  forms 06/25/17 on evaluation today patient appears to be doing well in regard to her right great toe wound. She is still taking the doxycycline as prescribed by Dr. Ola Spurr. The Hydrofera Blue Dressing's to be doing as well as anything has for her up to this point and we are still waiting on an insurance authorization for the [place]. She is having no pain which is good news. No fevers, chills, nausea, or vomiting noted at this time. 07/02/17; still taking doxycycline as prescribed by Dr. Ola Spurr. Hydrofera Blue continues. We have no palpable bone. Still have not had had approval of Apligraf 08/06/16; the patient arrives today with [facility] even though we apparently had changed to Sorbact last time. Her co-pay for Regranex is $389 in discussion with the patient and her husband this was excessive. We'll continue with the Sorbact and standard dressings for now 08/20/17; we have been using sorbact. The wound bed still requires debridement. Wound measurements are slightly better. 09/03/17;using Sorbact.no debridement today. Wound measurements slightly better. There is some discoloration of the tip of her toelooks like bruising 09/17/17; using Sorbact. No debridement today. Wound dimensions are about the same. Still some discoloration at the tip of her great toe. This does not look any worse than last time Fitzmaurice, Brooklynn V. ([id number]) 10/01/17; using sorbact right great toe I thought she might have surface epithelialization last time although it is certainly not look like that today nevertheless her dimensions are better. Continued surface eschar on the tip of her toe but this is not progressing. She is actually complaining of the left great toe 10/15/17; this is  a very difficult area on the dorsal aspect of her proximal right great toe. At one point this wound had exposed bone however we have managed to get some degree of epithelialization. She was revascularized by Dr. Vallarie Mare in [place]. She saw Dr. Ola Spurr for underlying osteomyelitis. She is not a candidate for advanced treatment options [insurance issues]. She comes in today with a new area on the tip of her right great toe. The exact history here is unclear. We have been using sorbact 10/29/17; patient arrives today with very significant bilateral increase edema which appears to extend into the thighs. This is tight and not particularly pitting however it looks a lot more impressive than I remember. Not surprisingly the wounds on her right great toe have increased in size with weeping edema fluid as the edema extends into the dorsal foot itself. She also has a wound on the tip of her right great toe which is a new wound from 2 weeks ago. We have been using sorbact. 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 He is on Demadex presumably twice a day at 20 mg. I have asked her family to make her an appointment with her primary physicians at the [facility] to go over this degree of edema. This is causing I think deterioration in the right great toe wound. She also has significant PAD and is status post right leg bypass graft. I think this was done by [facility]. I believe they also said that there was nothing further that could be done with regards to her vascular status. 11/12/17; patient is going to see her primary doctor this afternoon with regards to lower extremity edema. She has 2 open wounds on the right great toe the original wound on the dorsal proximal part and then a more necrotic looking  area on the tip of her right great toe. We took some time today to review her vascular status. The patient  had a relate below knee popliteal to peroneal artery bypass with reversed greater saphenous vein on 06/13/17; she also had endarterectomy of the midsegment of the peroneal artery. 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 I think at some point in time after that she was offered an arteriogram on the right but she declined. I've used Silver collagen to her wounds recently. 11/26/17; patient has 2 open wounds on the right great toe tip as well as the dorsal aspect of her right great toe. She has known PAD. I've been trying to get her back to see vein and vascular in [facility] to see if there are options for endovascular surgery to help with perfusion although the patient and her husband may not want any more surgery. We've been using silver alginate because of maceration. She does not have an option for advanced treatment/skin substitutes 12/10/17; continued open wounds much the same as 2 weeks ago on the dorsal aspect of the right great toe on the right great toe tip. I've encouraged to follow-up with Dr. Bridgett Larsson of vein and vascular to explore the possibility that there may be correctable ischemia involved in nonhealing these areas. We made considerable progress on the dorsal right great toe however it is stalled recently. We do not have an advanced treatment option. Originally this had exposed bone however there is a granulated base. 12/24/17; patient went back to see vein and vascular in [place]. She was seen by Manus Gunning NP. ABIs on the right were noted to be 0.84 and TBI of the right and 0.62. On the left her ABI was 0.82 and her TBI of 0.75. Waveforms were monophasic at the posterior tibial and dorsalis pedis bilaterally. From the town of the note she was going to be considered for an arteriogram although the patient does not want an arteriogram out of  concern for her periprocedural stroke that she had previously during an attempted this. Her husband reiterates today that she does not want an arteriogram therefore I'm not going to press the issue. We've been using silver collagen to the wounds on the tip of the right great toe and dorsal surface of the right great toe. 01/07/18; patient arrives today with the tip of her toe healed. An cerebral amount of slough over the wound on the dorsal toe. As noted on 12/24/17 no options currently for revascularization the patient will not allow an arteriogram 01/21/18; typical of her right great toe remains healed. Still large amount of slough over the wound on the dorsal toe. No options for revascularization. We've been using Medihoney [name], [name] ([id number]) Objective Constitutional Vitals Time Taken: 10:23 AM, Height: 64 in, Weight: 200 lbs, BMI: 34.3, Temperature: 97.6 F, Pulse: 75 bpm, Respiratory Rate: 18 breaths/min, Blood Pressure: 154/57 mmHg. Integumentary (Hair, Skin) Wound #7 status is Open. Original cause of wound was Gradually Appeared. The wound is located on the Right,Dorsal Metatarsal head first. The wound measures 2.5cm length x 0.8cm width x 0.2cm depth; 1.571cm^2 area and 0.314cm^3 volume. There is no tunneling or undermining noted. There is a large amount of serous drainage noted. The wound margin is distinct with the outline attached to the wound base. There is small (1-33%) pink granulation within the wound bed. There is a large (67-100%)  amount of necrotic tissue within the wound bed including Adherent Slough. Periwound temperature was noted as No Abnormality. The periwound has tenderness on palpation. 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 There was a Excisional Skin/Subcutaneous Tissue Debridement with a total area of 2 sq cm performed by Ricard Dillon, MD. With the following instrument(s): Curette to remove Viable and Non-Viable tissue/material. Material removed includes Subcutaneous Tissue and Slough and after achieving pain control using Other (lidocaine 4%). No specimens were taken. A time out was conducted at 11:02, prior to the start of the procedure. A Minimum amount of bleeding was controlled with Pressure. The procedure was tolerated well with a pain level of 1 throughout and a pain level of 1 following the procedure. Patient s Level of Consciousness post procedure was recorded as Awake and Alert. Post Debridement Measurements: 2.5cm length x 0.8cm width x 0.3cm depth; 0.471cm^3 volume. Character of Wound/Ulcer Post Debridement requires further debridement. Severity of Tissue Post Debridement is: Fat layer exposed. Post procedure Diagnosis Wound #7: Same as Pre-Procedure [name], [name] ([id number]) Plan Wound Cleansing: Wound #7 Right,Dorsal Metatarsal head first: Clean wound with Normal Saline. Anesthetic (add to Medication List): Wound #7 Right,Dorsal Metatarsal head first: Topical Lidocaine 4% cream applied to wound bed prior to debridement (In Clinic Only). Primary Wound Dressing: Wound #7 Right,Dorsal Metatarsal head first: Medihoney gel Secondary Dressing: Wound #7 Right,Dorsal Metatarsal head first: ABD and  Kerlix/Conform Dressing Change Frequency: Wound #7 Right,Dorsal Metatarsal head first: Change Dressing Monday, Wednesday, Friday Follow-up Appointments: Wound #7 Right,Dorsal Metatarsal head first: Return Appointment in 2 weeks. Edema Control: Wound #7 Right,Dorsal Metatarsal head first: Elevate legs to the level of the heart and pump ankles as often as possible Additional Orders / Instructions: Wound #7 Right,Dorsal Metatarsal head first: Increase protein intake. Activity as tolerated Home Health: Wound #7 Right,Dorsal Metatarsal head first: [facility], [facility] Continue [facility] Health Visits - [facility] weekly, [facility] Home Health Nurse may visit [facility] to address patient s wound care needs. Home Health Nurse may visit [facility] to address patient s wound care needs. FACE TO FACE ENCOUNTER: MEDICARE and MEDICAID PATIENTS: I certify that this patient is under my care and that I had a face-to-face encounter that meets the physician face-to-face encounter requirements with this patient on this date. The encounter with the patient was in whole or in part for the following MEDICAL CONDITION: (primary reason for [place]) MEDICAL NECESSITY: I certify, that based on my findings, NURSING services are a medically necessary home health service. HOME BOUND STATUS: I certify that my clinical findings support that this patient is homebound (i.e., Due to illness or injury, pt requires aid of supportive devices such as crutches, cane, wheelchairs, walkers, the use of special transportation or the assistance of another person to leave their place of residence.  There is a normal inability to leave the home and doing so requires considerable and taxing effort. Other absences are for medical reasons / religious services and are infrequent or of short duration when for other reasons). FACE TO FACE ENCOUNTER: MEDICARE and  MEDICAID PATIENTS: I certify that this patient is under my care and that I had a face-to-face encounter that meets the physician face-to-face encounter requirements with this patient on this date. The encounter with the patient was in whole or in part for the following MEDICAL CONDITION: (primary reason for [place]) MEDICAL NECESSITY: I certify, that based on my findings, NURSING services are a medically necessary home health service. HOME BOUND STATUS: I certify that my clinical findings support that this patient is homebound (i.e., Due to illness or injury, pt requires aid of supportive devices such as crutches, cane, wheelchairs, walkers, the use of special transportation or the assistance of another person to leave their place of residence. There is a normal inability to leave the home and doing so requires considerable and taxing effort. Other absences are for medical reasons / religious services and are infrequent or of short duration when for other reasons). If current dressing causes regression in wound condition, may D/C ordered dressing product/s and apply Normal Saline Moist Dressing daily until next [place] / Other MD appointment. [place] of regression in wound condition at [phone]. If current dressing causes regression in wound condition, may D/C ordered dressing product/s and apply Normal Saline Moist Dressing daily until next [place] / Other MD appointment. [place] of regression in wound condition at [phone]. Please direct any NON-WOUND related issues/requests for orders to patient's Primary Care Physician Please direct any NON-WOUND related issues/requests for orders to patient's Primary Care Physician Galindo, [facility] ([id number]) #1 Medihoney gel #2 no change in the dressing #3 surface of the wound even though she is requiring debridement actually looks some better Electronic Signature(s) Signed:  01/21/2018 4:07:49 PM By: Linton Ham MD Entered By: Linton Ham on 01/21/2018 12:39:02 Pernell, [name] ([id number]) -------------------------------------------------------------------------------- [place] Details Patient Name: Lowenthal, Xitlalic V. Date of Service: 01/21/2018 Medical Record Number: [id number] Patient Account Number: [id number] Date of Birth/Sex: 02/01/1941 (77 y.o. 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 I70.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, right ankle and foot Facility Procedures CPT4 Code Description: 07867544 11042 - DEB SUBQ TISSUE 20 SQ CM/< ICD-10 Diagnosis Description L97.519 Non-pressure chronic ulcer of other part of right foot with unspe Modifier: cified severit Quantity: 1 y Physician Procedures CPT4 Code Description: 9201007 11042 - WC PHYS SUBQ TISS 20 SQ CM ICD-10 Diagnosis Description L97.519 Non-pressure chronic ulcer of other part of right foot with unspec Modifier: ified severit Quantity: 1 y Engineer, maintenance) Signed: 01/21/2018 4:07:49 PM By: Linton Ham MD Entered By: Linton Ham on 01/21/2018 12:39:55

## 2018-01-24 NOTE — Progress Notes (Signed)
[name] [name] ([id number]) Visit Report for 01/21/2018 Arrival Information Details Patient Name: [name] [name] ALCARAZ. Date of Service: 01/21/2018 10:15 AM Medical Record Number: [id number] Patient Account Number: [id number] Date of Birth/Sex: [birth year] (77 y.o. F) Treating RN: Ahmed Prima Primary Care Saniah Schroeter: Beverlyn Roux Other Clinician: Referring Zali Kamaka: Beverlyn Roux Treating Kunio Cummiskey/Extender: Tito Dine in Treatment: 29 Visit Information History Since Last Visit All ordered tests and consults were completed: No Patient Arrived: Wheel Chair Added or deleted any medications: No Arrival Time: 10:18 Any new allergies or adverse reactions: No Accompanied By: husband Had a fall or experienced change in No Transfer Assistance: EasyPivot Patient activities of daily living that may affect Lift risk of falls: Patient Identification Verified: Yes Signs or symptoms of abuse/neglect since last visito No Secondary Verification Process Yes Hospitalized since last visit: No Completed: Implantable device outside of the clinic excluding No Patient Requires Transmission-Based No cellular tissue based products placed in the center Precautions: since last visit: Patient Has Alerts: No Has Dressing in Place as Prescribed: Yes Pain Present Now: No Electronic Signature(s) Signed: 01/22/2018 4:29:22 PM By: Alric Quan Entered By: Alric Quan on 01/21/2018 10:20:14 [name], [name] ([id number]) -------------------------------------------------------------------------------- Encounter Discharge Information Details Patient Name: [name] [name]. Date of Service: 01/21/2018 10:15 AM Medical Record Number: [id number] Patient Account Number: [id number] Date of Birth/Sex: 1941/04/17 (77 y.o. F) Treating RN: Montey Hora Primary Care Okie Bogacz: Beverlyn Roux Other Clinician: Referring Jakori Burkett: Beverlyn Roux Treating Rosabelle Jupin/Extender: Tito Dine in Treatment:  44 Encounter Discharge Information Items Discharge Condition: Stable Ambulatory Status: Wheelchair Discharge Destination: Home Transportation: Private Auto Accompanied ByRaechel Chute Schedule Follow-up Appointment: Yes Clinical Summary of Care: Electronic Signature(s) Signed: 01/21/2018 8:11:30 PM By: Montey Hora Entered By: Montey Hora on 01/21/2018 20:11:30 [name], [name] ([id number]) -------------------------------------------------------------------------------- Lower Extremity Assessment Details Patient Name: [name] [name]. Date of Service: 01/21/2018 10:15 AM Medical Record Number: [id number] Patient Account Number: [id number] Date of Birth/Sex: 07-28-1941 (77 y.o. F) Treating RN: Ahmed Prima Primary Care Zyanne Schumm: Beverlyn Roux Other Clinician: Referring Aneisha Skyles: Beverlyn Roux Treating Sharada Albornoz/Extender: Tito Dine in Treatment: 63 Edema Assessment Assessed: [Left: No] [Right: No] [Left: Edema] [Right: :] Calf Left: Right: Point of Measurement: 35 cm From Medial Instep cm 46.4 cm Ankle Left: Right: Point of Measurement: 12 cm From Medial Instep cm 32.4 cm Vascular Assessment Pulses: Dorsalis Pedis Palpable: [Right:No] Doppler Audible: [Right:Yes] Posterior Tibial Extremity colors, hair growth, and conditions: Extremity Color: [Right:Hyperpigmented] Temperature of Extremity: [Right:Warm] Capillary Refill: [Right:< 3 seconds] Toe Nail Assessment Left: Right: Thick: Yes Discolored: Yes Deformed: Yes Improper Length and Hygiene: Yes Electronic Signature(s) Signed: 01/22/2018 4:29:22 PM By: Alric Quan Entered By: Alric Quan on 01/21/2018 10:34:11 [name] [name]. ([id number]) -------------------------------------------------------------------------------- Multi Wound Chart Details Patient Name: [name] [name]. Date of Service: 01/21/2018 10:15 AM Medical Record Number: [id number] Patient Account Number: [id number] Date of Birth/Sex:  01-29-1941 (77 y.o. 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] Comorbid History: [7:Cataracts, Chronic sinus problems/congestion, Congestive Heart Failure, Hypertension, Type II Diabetes] [N/A:N/A] Date Acquired: [7:08/06/2013] [N/A:N/A] Weeks of Treatment: [7:63] [N/A:N/A] Wound Status: [7:Open] [N/A:N/A] Measurements L x  W x D [7:2.5x0.8x0.2] [N/A:N/A] (cm) Area (cm) : [7:1.571] [N/A:N/A] Volume (cm) : [7:0.314] [N/A:N/A] % Reduction in Area: [7:-33.40%] [N/A:N/A] % Reduction in Volume: [7:11.00%] [N/A:N/A] Classification: [7:Grade 2] [N/A:N/A] Exudate Amount: [7:Large] [N/A:N/A] Exudate Type: [7:Serous] [N/A:N/A] Exudate Color: [7:amber] [N/A:N/A] Wound Margin: [7:Distinct, outline attached] [N/A:N/A] Granulation Amount: [7:Small (1-33%)] [N/A:N/A] Granulation Quality: [7:Pink] [N/A:N/A] Necrotic Amount: [7:Large (67-100%)] [N/A:N/A] Exposed Structures: [7:Fascia: No Fat Layer (Subcutaneous Tissue) Exposed: No Tendon: No Muscle: No Joint: No Bone: No] [N/A:N/A] Epithelialization: [7:None] [N/A:N/A] Debridement: [7:Debridement - Excisional 11:02] [N/A:N/A N/A] Pre-procedure Verification/Time Out Taken: Pain Control: Other N/A N/A Tissue Debrided: Subcutaneous, Slough N/A N/A Level: Skin/Subcutaneous Tissue N/A N/A Debridement Area (sq cm): 2 N/A N/A Instrument: Curette N/A N/A Bleeding: Minimum N/A N/A Hemostasis Achieved: Pressure N/A N/A Procedural  Pain: 1 N/A N/A Post Procedural Pain: 1 N/A N/A Debridement Treatment Procedure was tolerated well N/A N/A Response: Post Debridement 2.5x0.8x0.3 N/A N/A Measurements L x W x D (cm) Post Debridement Volume: 0.471 N/A N/A (cm) Periwound Skin Texture: No Abnormalities Noted N/A N/A Periwound Skin Moisture: No Abnormalities Noted N/A N/A Periwound Skin Color: No Abnormalities Noted N/A N/A Temperature: No Abnormality N/A N/A Tenderness on Palpation: Yes N/A N/A Wound Preparation: Ulcer Cleansing: N/A N/A Rinsed/Irrigated with Saline Topical Anesthetic Applied: Other: lidocaine 4% Procedures Performed: Debridement N/A N/A Treatment Notes Electronic Signature(s) Signed: 01/21/2018 4:07:49 PM By: Linton Ham MD Entered By: Linton Ham on 01/21/2018 12:28:39 [name] [name] Leigh ([id number]) -------------------------------------------------------------------------------- Multi-Disciplinary Care Plan Details Patient Name: [name] [name]. Date of Service: 01/21/2018 10:15 AM Medical Record Number: [id number] Patient Account Number: [id number] Date of Birth/Sex: Apr 19, 1941 (77 y.o. F) Treating RN: Cornell Barman Primary Care Zamauri Nez: Beverlyn Roux Other Clinician: Referring Makenli Derstine: Beverlyn Roux Treating Tannisha Kennington/Extender: Tito Dine in Treatment: 1 Active Inactive ` Abuse / Safety / Falls / Self Care Management Nursing Diagnoses: Impaired physical mobility Potential for falls Goals: Patient will remain injury free Date Initiated: 11/06/2016 Target Resolution Date: 12/30/2016 Goal Status: Active Patient/caregiver will verbalize understanding of skin care regimen Date Initiated: 11/06/2016 Target Resolution Date: 12/30/2016 Goal Status: Active Interventions: Assess fall risk on admission and as needed Treatment Activities: Patient referred to home care : 11/06/2016 Notes: ` Nutrition Nursing Diagnoses: Potential for alteratiion in Nutrition/Potential for imbalanced  nutrition Goals: Patient/caregiver verbalizes understanding of need to maintain therapeutic glucose control per primary care physician Date Initiated: 11/06/2016 Target Resolution Date: 12/30/2016 Goal Status: Active Interventions: Provide education on elevated blood sugars and impact on wound healing Notes: ` Orientation to the Wound Care Program Nursing Diagnoses: Knowledge deficit related to the wound healing center program LYDIANA, MILLEY ([id number]) Goals: Patient/caregiver will verbalize understanding of the [place] Date Initiated: 11/06/2016 Target Resolution Date: 12/30/2016 Goal Status: Active Interventions: Provide education on orientation to the wound center Notes: ` Venous Leg Ulcer Nursing Diagnoses: Actual venous Insuffiency (use after diagnosis is confirmed) Knowledge deficit related to disease process and management Goals: Non-invasive venous studies are completed as ordered Date Initiated: 11/06/2016 Target Resolution Date: 12/30/2016 Goal Status: Active Patient/caregiver will verbalize understanding of disease process and disease management Date Initiated: 11/06/2016 Target Resolution Date: 12/30/2016 Goal Status: Active Interventions: Assess peripheral edema status every visit. Notes: ` Wound/Skin Impairment Nursing Diagnoses: Impaired tissue integrity Knowledge deficit related to smoking impact on wound healing Knowledge deficit related to ulceration/compromised skin integrity Goals: Ulcer/skin breakdown will heal within 14 weeks Date Initiated: 11/06/2016 Target Resolution Date: 02/05/2017 Goal Status: Active Interventions: Assess ulceration(s) every visit Treatment Activities:  Skin care regimen initiated : 11/06/2016 Notes: Electronic Signature(s) Signed: 01/21/2018 4:19:21 PM By: Gretta Cool, BSN, RN, CWS, Kim RN, BSN Maldonado, Lesia [name]. ([id number]) Entered By: Gretta Cool, BSN, RN, CWS, Kim on 01/21/2018 11:01:54 Menge, [name] Leigh  ([id number]) -------------------------------------------------------------------------------- Pain Assessment Details Patient Name: [name] [name]. Date of Service: 01/21/2018 10:15 AM Medical Record Number: [id number] Patient Account Number: [id number] Date of Birth/Sex: 03-04-1941 (77 y.o. F) Treating RN: Ahmed Prima Primary Care Evie Croston: Beverlyn Roux Other Clinician: Referring Reneka Nebergall: Beverlyn Roux Treating Zasha Belleau/Extender: Tito Dine in Treatment: 5 Active Problems Location of Pain Severity and Description of Pain Patient Has Paino No Site Locations Pain Management and Medication Current Pain Management: Electronic Signature(s) Signed: 01/22/2018 4:29:22 PM By: Alric Quan Entered By: Alric Quan on 01/21/2018 10:20:22 Macmurray, Eunique [name] ([id number]) -------------------------------------------------------------------------------- Patient/Caregiver Education Details Patient Name: [name] [name]. Date of Service: 01/21/2018 10:15 AM Medical Record Number: [id number] Patient Account Number: [id number] Date of Birth/Gender: [birth year] (77 y.o. F) Treating RN: Montey Hora Primary Care Physician: Beverlyn Roux Other Clinician: Referring Physician: Beverlyn Roux Treating Physician/Extender: Tito Dine in Treatment: 26 Education Assessment Education Provided To: Caregiver Education Topics Provided Wound/Skin Impairment: Handouts: Other: Wound care as ordered Methods: Demonstration, Explain/Verbal Responses: State content correctly Electronic Signature(s) Signed: 01/22/2018 10:39:50 AM By: Montey Hora Entered By: Montey Hora on 01/21/2018 20:12:13 Archila, [name] VMarland Kitchen ([id number]) -------------------------------------------------------------------------------- Wound Assessment Details Patient Name: [name] [name]. Date of Service: 01/21/2018 10:15 AM Medical Record Number: [id number] Patient Account Number: [id number] Date of Birth/Sex:  20-Nov-1940 (77 y.o. F) Treating RN: Ahmed Prima Primary Care Senovia Gauer: Beverlyn Roux Other Clinician: Referring Deo Mehringer: Beverlyn Roux Treating Dmarius Reeder/Extender: Tito Dine in Treatment: 55 Wound Status Wound Number: 7 Primary Diabetic Wound/Ulcer of the Lower Extremity Etiology: Wound Location: Right Metatarsal head first - Dorsal Wound Open Wounding Event: Gradually Appeared Status: Date Acquired: 08/06/2013 Comorbid Cataracts, Chronic sinus problems/congestion, Weeks Of Treatment: 63 History: Congestive Heart Failure, Hypertension, Type II Clustered Wound: No Diabetes Photos Photo Uploaded By: Alric Quan on 01/22/2018 16:34:57 Wound Measurements Length: (cm) 2.5 Width: (cm) 0.8 Depth: (cm) 0.2 Area: (cm) 1.571 Volume: (cm) 0.314 % Reduction in Area: -33.4% % Reduction in Volume: 11% Epithelialization: None Tunneling: No Undermining: No Wound Description Classification: Grade 2 Wound Margin: Distinct, outline attached Exudate Amount: Large Exudate Type: Serous Exudate Color: amber Foul Odor After Cleansing: No Slough/Fibrino Yes Wound Bed Granulation Amount: Small (1-33%) Exposed Structure Granulation Quality: Pink Fascia Exposed: No Necrotic Amount: Large (67-100%) Fat Layer (Subcutaneous Tissue) Exposed: No Necrotic Quality: Adherent Slough Tendon Exposed: No Muscle Exposed: No Joint Exposed: No Bone Exposed: No Periwound Skin Texture Texture Color No Abnormalities Noted: No No Abnormalities Noted: No [name] [name]. ([id number]) Moisture Temperature / Pain No Abnormalities Noted: No Temperature: No Abnormality Tenderness on Palpation: Yes Wound Preparation Ulcer Cleansing: Rinsed/Irrigated with Saline Topical Anesthetic Applied: Other: lidocaine 4%, Treatment Notes Wound #7 (Right, Dorsal Metatarsal head first) 1. Cleansed with: Clean wound with Normal Saline 3. Peri-wound Care: Moisturizing lotion 4. Dressing  Applied: Medihoney Gel 5. Secondary Dressing Applied ABD Pad Dry Gauze Kerlix/Conform Notes darko shoe, heel cup Electronic Signature(s) Signed: 01/22/2018 4:29:22 PM By: Alric Quan Entered By: Alric Quan on 01/21/2018 10:32:22 [name] [name]. ([id number]) -------------------------------------------------------------------------------- Vitals Details Patient Name: Capano, [name] [name]. Date of Service: 01/21/2018 10:15 AM Medical Record Number: [id number] Patient Account Number: [id number] Date of Birth/Sex: 06-01-1941 (77 y.o. F) Treating RN: Ahmed Prima Primary Care Anouk Critzer: Beverlyn Roux Other Clinician: Referring Sarajane Fambrough: Beverlyn Roux  Treating Paddy Neis/Extender: Ricard Dillon Weeks in Treatment: 63 Vital Signs Time Taken: 10:23 Temperature (F): 97.6 Height (in): 64 Pulse (bpm): 75 Weight (lbs): 200 Respiratory Rate (breaths/min): 18 Body Mass Index (BMI): 34.3 Blood Pressure (mmHg): 154/57 Reference Range: 80 - 120 mg / dl Electronic Signature(s) Signed: 01/22/2018 4:29:22 PM By: Alric Quan Entered By: Alric Quan on 01/21/2018 10:24:24

## 2018-02-04 ENCOUNTER — Encounter: Payer: Medicare HMO | Attending: Internal Medicine | Admitting: Internal Medicine

## 2018-02-04 DIAGNOSIS — I110 Hypertensive heart disease with heart failure: Secondary | ICD-10-CM | POA: Diagnosis not present

## 2018-02-04 DIAGNOSIS — E1151 Type 2 diabetes mellitus with diabetic peripheral angiopathy without gangrene: Secondary | ICD-10-CM | POA: Insufficient documentation

## 2018-02-04 DIAGNOSIS — Z8673 Personal history of transient ischemic attack (TIA), and cerebral infarction without residual deficits: Secondary | ICD-10-CM | POA: Insufficient documentation

## 2018-02-04 DIAGNOSIS — E1169 Type 2 diabetes mellitus with other specified complication: Secondary | ICD-10-CM | POA: Insufficient documentation

## 2018-02-04 DIAGNOSIS — E11622 Type 2 diabetes mellitus with other skin ulcer: Secondary | ICD-10-CM | POA: Insufficient documentation

## 2018-02-04 DIAGNOSIS — Z8614 Personal history of Methicillin resistant Staphylococcus aureus infection: Secondary | ICD-10-CM | POA: Diagnosis not present

## 2018-02-04 DIAGNOSIS — I5032 Chronic diastolic (congestive) heart failure: Secondary | ICD-10-CM | POA: Diagnosis not present

## 2018-02-04 DIAGNOSIS — M86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, right ankle and foot: Secondary | ICD-10-CM | POA: Diagnosis not present

## 2018-02-04 DIAGNOSIS — L97219 Non-pressure chronic ulcer of right calf with unspecified severity: Secondary | ICD-10-CM | POA: Diagnosis not present

## 2018-02-04 DIAGNOSIS — Z794 Long term (current) use of insulin: Secondary | ICD-10-CM | POA: Diagnosis not present

## 2018-02-04 DIAGNOSIS — Z880 Allergy status to penicillin: Secondary | ICD-10-CM | POA: Diagnosis not present

## 2018-02-04 DIAGNOSIS — I70232 Atherosclerosis of native arteries of right leg with ulceration of calf: Secondary | ICD-10-CM | POA: Insufficient documentation

## 2018-02-06 NOTE — Progress Notes (Signed)
CLAUDELL, RHODY ([id number]) Visit Report for 02/04/2018 Arrival Information Details Patient Name: [name], [name] DEA. Date of Service: 02/04/2018 10:15 AM Medical Record Number: [id number] Patient Account Number: [id number] Date of Birth/Sex: 1941/02/15 (77 y.o. F) Treating RN: Ahmed Prima Primary Care Hugh Kamara: Beverlyn Roux Other Clinician: Referring Gatsby Chismar: Beverlyn Roux Treating Paislei Dorval/Extender: Tito Dine in Treatment: 58 Visit Information History Since Last Visit All ordered tests and consults were completed: No Patient Arrived: Wheel Chair Added or deleted any medications: No Arrival Time: 10:19 Any new allergies or adverse reactions: No Accompanied By: spouse Had a fall or experienced change in No Transfer Assistance: EasyPivot Patient activities of daily living that may affect Lift risk of falls: Patient Identification Verified: Yes Signs or symptoms of abuse/neglect since last visito No Secondary Verification Process Yes Hospitalized since last visit: No Completed: Implantable device outside of the clinic excluding No Patient Requires Transmission-Based No cellular tissue based products placed in the center Precautions: since last visit: Patient Has Alerts: No Has Dressing in Place as Prescribed: Yes Pain Present Now: Yes Electronic Signature(s) Signed: 02/04/2018 3:49:29 PM By: Alric Quan Entered By: Alric Quan on 02/04/2018 10:20:43 Kauk, Loleta White. ([id number]) -------------------------------------------------------------------------------- Encounter Discharge Information Details Patient Name: [name], Basya White. Date of Service: 02/04/2018 10:15 AM Medical Record Number: [id number] Patient Account Number: [id number] Date of Birth/Sex: [birth year] (77 y.o. F) Treating RN: Montey Hora Primary Care Hisako Bugh: Beverlyn Roux Other Clinician: Referring Lanya Bucks: Beverlyn Roux Treating Jamill Wetmore/Extender: Tito Dine in Treatment:  27 Encounter Discharge Information Items Discharge Condition: Stable Ambulatory Status: Wheelchair Discharge Destination: Home Transportation: Private Auto Accompanied By: spouse Schedule Follow-up Appointment: Yes Clinical Summary of Care: Electronic Signature(s) Signed: 02/04/2018 12:08:09 PM By: Montey Hora Entered By: Montey Hora on 02/04/2018 12:08:09 Waibel, Lawrie VMarland Kitchen ([id number]) -------------------------------------------------------------------------------- Lower Extremity Assessment Details Patient Name: Beldin, Reighlynn White. Date of Service: 02/04/2018 10:15 AM Medical Record Number: [id number] Patient Account Number: [id number] Date of Birth/Sex: 31-Oct-1940 (77 y.o. F) Treating RN: Ahmed Prima Primary Care Shreyan Hinz: Beverlyn Roux Other Clinician: Referring Lora Chavers: Beverlyn Roux Treating Tyreke Kaeser/Extender: Tito Dine in Treatment: 65 Edema Assessment Assessed: [Left: No] [Right: No] [Left: Edema] [Right: :] Calf Left: Right: Point of Measurement: 35 cm From Medial Instep cm 45.7 cm Ankle Left: Right: Point of Measurement: 12 cm From Medial Instep cm 31.2 cm Vascular Assessment Pulses: Dorsalis Pedis Palpable: [Right:No] Doppler Audible: [Right:Yes] Posterior Tibial Extremity colors, hair growth, and conditions: Extremity Color: [Right:Hyperpigmented] Temperature of Extremity: [Right:Warm] Capillary Refill: [Right:< 3 seconds] Toe Nail Assessment Left: Right: Thick: Yes Discolored: Yes Deformed: Yes Improper Length and Hygiene: Yes Electronic Signature(s) Signed: 02/04/2018 3:49:29 PM By: Alric Quan Entered By: Alric Quan on 02/04/2018 10:32:09 Gravette, Champagne White. ([id number]) -------------------------------------------------------------------------------- Multi Wound Chart Details Patient Name: [name] Date of Service: 02/04/2018 10:15 AM Medical Record Number: [id number] Patient Account Number: [id number] Date of Birth/Sex:  10-18-1940 (77 y.o. 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] Comorbid History: [7:Cataracts, Chronic sinus problems/congestion, Congestive Heart Failure, Hypertension, Type II Diabetes] [N/A:N/A] Date Acquired: [7:08/06/2013] [N/A:N/A] Weeks of Treatment: [7:65] [N/A:N/A] Wound Status: [7:Open] [N/A:N/A] Measurements L x  W x D [7:2.5x0.8x0.2] [N/A:N/A] (cm) Area (cm) : [7:1.571] [N/A:N/A] Volume (cm) : [7:0.314] [N/A:N/A] % Reduction in Area: [7:-33.40%] [N/A:N/A] % Reduction in Volume: [7:11.00%] [N/A:N/A] Classification: [7:Grade 2] [N/A:N/A] Exudate Amount: [7:Large] [N/A:N/A] Exudate Type: [7:Serous] [N/A:N/A] Exudate Color: [7:amber] [N/A:N/A] Wound Margin: [7:Distinct, outline attached] [N/A:N/A] Granulation Amount: [7:Small (1-33%)] [N/A:N/A] Granulation Quality: [7:Pink] [N/A:N/A] Necrotic Amount: [7:Large (67-100%)] [N/A:N/A] Exposed Structures: [7:Fascia: No Fat Layer (Subcutaneous Tissue) Exposed: No Tendon: No Muscle: No Joint: No Bone: No] [N/A:N/A] Epithelialization: [7:None] [N/A:N/A] Debridement: [7:Debridement - Excisional 10:55] [N/A:N/A N/A] Pre-procedure Verification/Time Out Taken: Pain Control: Other N/A N/A Tissue Debrided: Subcutaneous, Slough N/A N/A Level: Skin/Subcutaneous Tissue N/A N/A Debridement Area (sq cm): 2 N/A N/A Instrument: Curette N/A N/A Bleeding: Minimum N/A N/A Hemostasis Achieved: Pressure N/A N/A Debridement  Treatment Procedure was tolerated well N/A N/A Response: Post Debridement 2.5x0.8x0.3 N/A N/A Measurements L x W x D (cm) Post Debridement Volume: 0.471 N/A N/A (cm) Periwound Skin Texture: No Abnormalities Noted N/A N/A Periwound Skin Moisture: No Abnormalities Noted N/A N/A Periwound Skin Color: No Abnormalities Noted N/A N/A Temperature: No Abnormality N/A N/A Tenderness on Palpation: Yes N/A N/A Wound Preparation: Ulcer Cleansing: N/A N/A Rinsed/Irrigated with Saline Topical Anesthetic Applied: Other: lidocaine 4% Procedures Performed: Debridement N/A N/A Treatment Notes Wound #7 (Right, Dorsal Metatarsal head first) 1. Cleansed with: Clean wound with Normal Saline 2. Anesthetic Topical Lidocaine 4% cream to wound bed prior to debridement 3. Peri-wound Care: Moisturizing lotion 4. Dressing Applied: Medihoney Gel 5. Secondary Dressing Applied ABD Pad Dry Gauze Foam Kerlix/Conform 7. Secured with Tape Notes darko shoe, heel cup Electronic Signature(s) Signed: 02/06/2018 8:34:07 AM By: Linton Ham MD Entered By: Linton Ham on 02/04/2018 12:44:26 Mcgilvery, An [name] ([id number]) -------------------------------------------------------------------------------- Multi-Disciplinary Care Plan Details Patient Name: Althaus, Cynai White. Date of Service: 02/04/2018 10:15 AM Medical Record Number: [id number] Patient Account Number: [id number] Date of Birth/Sex: 06-Aug-1940 (77 y.o. F) Treating RN: Cornell Barman Primary Care [name] Orton: Beverlyn Roux Other Clinician: Referring Lathyn Griggs: Beverlyn Roux Treating Tata Timmins/Extender: Tito Dine in Treatment: 5 Active Inactive ` Abuse / Safety / Falls / Self Care Management Nursing Diagnoses: Impaired physical mobility Potential for falls Goals: Patient will remain injury free Date Initiated: 11/06/2016 Target Resolution Date: 12/30/2016 Goal Status: Active Patient/caregiver will verbalize understanding of skin care  regimen Date Initiated: 11/06/2016 Target Resolution Date: 12/30/2016 Goal Status: Active Interventions: Assess fall risk on admission and as needed Treatment Activities: Patient referred to home care : 11/06/2016 Notes: ` Nutrition Nursing Diagnoses: Potential for alteratiion in Nutrition/Potential for imbalanced nutrition Goals: Patient/caregiver verbalizes understanding of need to maintain therapeutic glucose control per primary care physician Date Initiated: 11/06/2016 Target Resolution Date: 12/30/2016 Goal Status: Active Interventions: Provide education on elevated blood sugars and impact on wound healing Notes: ` Orientation to the Wound Care Program Nursing Diagnoses: Knowledge deficit related to the wound healing center program ALTHERIA, SHADOAN ([id number]) Goals: Patient/caregiver will verbalize understanding of the [place] Date Initiated: 11/06/2016 Target Resolution Date: 12/30/2016 Goal Status: Active Interventions: Provide education on orientation to the wound center Notes: ` Venous Leg Ulcer Nursing Diagnoses: Actual venous Insuffiency (use after diagnosis is confirmed) Knowledge deficit related to disease process and management Goals: Non-invasive venous studies are completed as ordered Date Initiated: 11/06/2016 Target Resolution Date: 12/30/2016 Goal Status: Active Patient/caregiver will verbalize understanding of disease process and disease management Date Initiated: 11/06/2016 Target Resolution Date: 12/30/2016 Goal Status: Active Interventions: Assess peripheral edema status every visit. Notes: ` Wound/Skin Impairment Nursing  Diagnoses: Impaired tissue integrity Knowledge deficit related to smoking impact on wound healing Knowledge deficit related to ulceration/compromised skin integrity Goals: Ulcer/skin breakdown will heal within 14 weeks Date Initiated: 11/06/2016 Target Resolution Date: 02/05/2017 Goal Status:  Active Interventions: Assess ulceration(s) every visit Treatment Activities: Skin care regimen initiated : 11/06/2016 Notes: Electronic Signature(s) Signed: 02/04/2018 4:43:24 PM By: Gretta Cool, BSN, RN, CWS, Kim RN, BSN Hallam, Chaitra White. ([id number]) Entered By: Gretta Cool, BSN, RN, CWS, Kim on 02/04/2018 10:58:08 Barca, Rozetta [name] ([id number]) -------------------------------------------------------------------------------- Pain Assessment Details Patient Name: Glauser, [place] Date of Service: 02/04/2018 10:15 AM Medical Record Number: [id number] Patient Account Number: [id number] Date of Birth/Sex: 10-23-1940 (77 y.o. F) Treating RN: Ahmed Prima Primary Care Suresh Audi: Beverlyn Roux Other Clinician: Referring Safari Cinque: Beverlyn Roux Treating Jozette Castrellon/Extender: Tito Dine in Treatment: 18 Active Problems Location of Pain Severity and Description of Pain Patient Has Paino Yes Site Locations Pain Location: Pain in Ulcers Rate the pain. Current Pain Level: 4 Character of Pain Describe the Pain: Aching Pain Management and Medication Current Pain Management: Notes Topical or injectable lidocaine is offered to patient for acute pain when surgical debridement is performed. If needed, Patient is instructed to use over the counter pain medication for the following 24-48 hours after debridement. Wound care MDs do not prescribed pain medications. Patient has chronic pain or uncontrolled pain. Patient has been instructed to make an appointment with their Primary Care Physician for pain management. Electronic Signature(s) Signed: 02/04/2018 3:49:29 PM By: Alric Quan Entered By: Alric Quan on 02/04/2018 10:20:52 Ragas, Jonette [name] ([id number]) -------------------------------------------------------------------------------- Patient/Caregiver Education Details Patient Name: Cosens, Tywana White. Date of Service: 02/04/2018 10:15 AM Medical Record Number: [id number] Patient Account Number:  [id number] Date of Birth/Gender: 06-26-1941 (77 y.o. F) Treating RN: Montey Hora Primary Care Physician: Beverlyn Roux Other Clinician: Referring Physician: Beverlyn Roux Treating Physician/Extender: Tito Dine in Treatment: 13 Education Assessment Education Provided To: Patient and Caregiver Education Topics Provided Wound/Skin Impairment: Handouts: Other: may use lotion to periwound area Methods: Explain/Verbal Responses: State content correctly Electronic Signature(s) Signed: 02/04/2018 3:51:22 PM By: Montey Hora Entered By: Montey Hora on 02/04/2018 12:08:31 Schleicher, Yalitza White. ([id number]) -------------------------------------------------------------------------------- Wound Assessment Details Patient Name: Housholder, Nardos White. Date of Service: 02/04/2018 10:15 AM Medical Record Number: [id number] Patient Account Number: [id number] Date of Birth/Sex: 1940-08-14 (77 y.o. F) Treating RN: Ahmed Prima Primary Care Monty Mccarrell: Beverlyn Roux Other Clinician: Referring Jayro Mcmath: Beverlyn Roux Treating Dera Vanaken/Extender: Tito Dine in Treatment: 57 Wound Status Wound Number: 7 Primary Diabetic Wound/Ulcer of the Lower Extremity Etiology: Wound Location: Right Metatarsal head first - Dorsal Wound Open Wounding Event: Gradually Appeared Status: Date Acquired: 08/06/2013 Comorbid Cataracts, Chronic sinus problems/congestion, Weeks Of Treatment: 65 History: Congestive Heart Failure, Hypertension, Type II Clustered Wound: No Diabetes Photos Photo Uploaded By: Alric Quan on 02/04/2018 15:20:09 Wound Measurements Length: (cm) 2.5 Width: (cm) 0.8 Depth: (cm) 0.2 Area: (cm) 1.571 Volume: (cm) 0.314 % Reduction in Area: -33.4% % Reduction in Volume: 11% Epithelialization: None Tunneling: No Undermining: No Wound Description Classification: Grade 2 Wound Margin: Distinct, outline attached Exudate Amount: Large Exudate Type: Serous Exudate Color:  amber Foul Odor After Cleansing: No Slough/Fibrino Yes Wound Bed Granulation Amount: Small (1-33%) Exposed Structure Granulation Quality: Pink Fascia Exposed: No Necrotic Amount: Large (67-100%) Fat Layer (Subcutaneous Tissue) Exposed: No Necrotic Quality: Adherent Slough Tendon Exposed: No Muscle Exposed: No Joint Exposed: No Bone Exposed: No Periwound Skin Texture Petrovic, Elinor White. ([id number]) Texture Color No Abnormalities Noted: No No Abnormalities Noted:  No Moisture Temperature / Pain No Abnormalities Noted: No Temperature: No Abnormality Tenderness on Palpation: Yes Wound Preparation Ulcer Cleansing: Rinsed/Irrigated with Saline Topical Anesthetic Applied: Other: lidocaine 4%, Treatment Notes Wound #7 (Right, Dorsal Metatarsal head first) 1. Cleansed with: Clean wound with Normal Saline 2. Anesthetic Topical Lidocaine 4% cream to wound bed prior to debridement 3. Peri-wound Care: Moisturizing lotion 4. Dressing Applied: Medihoney Gel 5. Secondary Dressing Applied ABD Pad Dry Gauze Foam Kerlix/Conform 7. Secured with Tape Notes darko shoe, heel cup Electronic Signature(s) Signed: 02/04/2018 3:49:29 PM By: Alric Quan Entered By: Alric Quan on 02/04/2018 10:30:17 Gabbert, Sokhna White. ([id number]) -------------------------------------------------------------------------------- Vitals Details Patient Name: Waltrip, Layanna White. Date of Service: 02/04/2018 10:15 AM Medical Record Number: [id number] Patient Account Number: [id number] Date of Birth/Sex: 1940-12-02 (77 y.o. F) Treating RN: Ahmed Prima Primary Care Mckinnley Smithey: Beverlyn Roux Other Clinician: Referring Kourtnei Rauber: Beverlyn Roux Treating Darlisha Kelm/Extender: Tito Dine in Treatment: 65 Vital Signs Time Taken: 10:22 Temperature (F): 97.8 Height (in): 64 Pulse (bpm): 73 Weight (lbs): 200 Respiratory Rate (breaths/min): 18 Body Mass Index (BMI): 34.3 Blood Pressure (mmHg):  138/67 Reference Range: 80 - 120 mg / dl Electronic Signature(s) Signed: 02/04/2018 3:49:29 PM By: Alric Quan Entered By: Alric Quan on 02/04/2018 10:22:54

## 2018-02-06 NOTE — Progress Notes (Signed)
JENNIFIER, SMITHERMAN ([id number]) Visit Report for 02/04/2018 Debridement Details Patient Name: [name], [name] Date of Service: 02/04/2018 10:15 AM Medical Record Number: [id number] Patient Account Number: [id number] Date of Birth/Sex: [birth year] (77 y.o. F) Treating RN: Cornell Barman Primary Care Provider: Beverlyn Roux Other Clinician: Referring Provider: Beverlyn Roux Treating Provider/Extender: Tito Dine in Treatment: 65 Debridement Performed for Wound #7 Right,Dorsal Metatarsal head first Assessment: Performed By: Physician Ricard Dillon, MD Debridement Type: Debridement Severity of Tissue Pre Fat layer exposed Debridement: Pre-procedure Verification/Time Yes - 10:55 Out Taken: Start Time: 10:55 Pain Control: Other : lidocaine 4% Total Area Debrided (L x W): 2.5 (cm) x 0.8 (cm) = 2 (cm) Tissue and other material Viable, Non-Viable, Slough, Subcutaneous, Slough debrided: Level: Skin/Subcutaneous Tissue Debridement Description: Excisional Instrument: Curette Bleeding: Minimum Hemostasis Achieved: Pressure Response to Treatment: Procedure was tolerated well Level of Consciousness: Awake and Alert Post Debridement Measurements of Total Wound Length: (cm) 2.5 Width: (cm) 0.8 Depth: (cm) 0.3 Volume: (cm) 0.471 Character of Wound/Ulcer Post Debridement: Stable Severity of Tissue Post Debridement: Fat layer exposed Post Procedure Diagnosis Same as Pre-procedure Electronic Signature(s) Signed: 02/04/2018 4:43:24 PM By: Gretta Cool, BSN, RN, CWS, Kim RN, BSN Signed: 02/06/2018 8:34:07 AM By: Linton Ham MD Entered By: Linton Ham on 02/04/2018 12:44:52 [name], [name] ([id number]) -------------------------------------------------------------------------------- HPI Details Patient Name: [name], [name] Date of Service: 02/04/2018 10:15 AM Medical Record Number: [id number] Patient Account Number: [id number] Date of Birth/Sex: 08-11-1940 (77 y.o. F) Treating RN: Cornell Barman Primary Care Provider: Beverlyn Roux Other Clinician: Referring Provider: Beverlyn Roux Treating Provider/Extender: Tito Dine in Treatment: 75 History of Present Illness HPI Description: 08/13/16: This is a 77 year old woman who came predominantly for review of 3 cm in diameter circular wound to the left anterior lateral leg. She was in the ER on 08/01/16 I reviewed their notes. There was apparently pus coming out of the wound at that time and the patient arrived requesting debridement which they don't do in the emergency room. Nevertheless I can't see that they did any x-rays. There were no cultures done. She is a type II diabetic and I a note after the patient was in the clinic that she had a bypass graft from the popliteal to the tibial on the right on 02/28/16. She also had a right greater saphenous vein harvest on the same date for arterial bypass. She is going to have vascular studies including ABIs T ABIs on the right on 08/28/16. The patient's surgery was on 02/28/16 by Dr. Vallarie Mare she had a right below the knee popliteal artery to peroneal artery bypass with reverse greater saphenous vein and an endarterectomy of the mid segment peroneal artery. Postoperatively she had a strong mild monophasic peroneal signal with a pink foot. It would appear that the patient is had some nonhealing in the surgical saphenous vein harvest site on the left leg. Surprisingly looking through [facility] I cannot see much information about this at all. Dr. Lucious Groves notes from 05/29/16 show that the patient's wounds "are not healed" the right first metatarsal wound healed but then opened back up. 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 At that point the patient had a wound VAC to her right calf with regards to the wounds on her dorsal right toe would appear that these are felt to be arterial  wounds. She has had surgery on the metatarsal phalangeal in 2015  I Dr. Doran Durand secondary to a right metatarsal phalangeal joint fracture. She is apparently had discoloration around this area since then. 08/28/16; patient arrives with her wounds in much the same condition. The linear vein harvest site and the circular wound below it which I think was a blister. She also has to probing holes in her right great toe and a necrotic eschar on the right second toe. Because of these being arterial wounds I reduced her compression from 3-2 layers this seems to of done satisfactorily she has not had any problems. I cannot see that she is actually had an x-ray ====== 11/06/16 the patient comes in for evaluation of her right lower extremity ulcers. She was here in January 2018 for 2 visits subsequently ended up in the hospital with pneumonia and then to rehabilitation. She has now been discharged from rehabilitation and is home. She has multiple ulcerations to her right lower extremity including the foot and toes. She does have home health in place and they have been placing alginate to the ulcers. She is followed by Dr. Bridgett Larsson of vascular medicine. 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 She recently saw him on 3/23. In office ABIs were: Right 0.48 with monophasic flow to the DP, PT, peroneal Left 0.63 with monophasic flow to the DP and PT Her arterial studies indicated a patent right below knee popliteal to peroneal bypass She had an MRI in February 2008 that was negative frosty myelitis but this showed general soft tissue edema in the right foot and lower extremity concerning for cellulitis She is a diabetic, managed with insulin. Her hemoglobin A1c in December 2017 was 8.4 which is a trend up from previous levels. She had blood work in February 2018 which revealed an albumin of 2.6 this appears to be relatively acute as an albumin in  November 2017 was 3.7 11/13/16; this is a patient I have not seen since February who is readmitted to our clinic last week. 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 Bridgett Larsson. I have reviewed Dr. Lianne Moris notes from March/23/18. Doppler ABI on that date showed an ABI on the right of 0.48 and on the left of 0.63. Dorsalis pedis waveforms were monophasic bilaterally. There was no waveforms detected at the posterior tibial on the right, monophasic on the left. Dr. Lianne Moris comments were that this patient would have follow-up vascular studies in 3 months including ABIs and right lower extremity arterial duplex. She had an MRI in February that was negative for osteomyelitis but showed generalized edema in Kissinger, Jasline White. ([id number]) the foot. Last albumin I see was in January at 3.4 we have been using Santyl to the 4 wounds on the right leg the patient is noted today to have widespread edema well up towards her groin this is pitting 2-3+. 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 Normal ejection fraction. 11/20/16; patient has a follow-up appointment with Dr. Bridgett Larsson on April 23. She is still complaining of a lot of pain in the right foot and right leg. It is not clear to me that this is at all positional however I think it is clear claudication with minimal activity perhaps at rest. At our suggestion she is return to her primary physician's office tomorrow with regards to her pitting lower extremity bilateral edema that  I reviewed in detail last week 11/27/16; the follow-up with Dr. Bridgett Larsson was actually on May 23 on April 23 as I stated in my note last week. N/A case all of her wounds seems somewhat smaller. The 2 on the right leg are definitely smaller. The areas on the dorsal right first toe, right third toe and the lateral part of the right fifth metatarsal head all looks smaller but have  tightly adherent surfaces. We have been using Santyl 12/04/16; follow-up with Dr. Bridgett Larsson on May 23. 2 small open wounds on the right leg continue to get smaller. The area on the right third total lateral aspect of the right fifth toe also look better. The remaining area on the dorsal first toe still has some depth to it. We have been using Santyl to the toes and collagen on the right 12/11/16; according to patient's husband the follow-up with Dr. Bridgett Larsson is not until July. All of the wounds on the right leg are measuring smaller. We have been using some combination of Prisma and Santyl although I think we can go to straight Prisma today. There may have been some confusion with home health about the primary dressing orders here. 12/25/16; the patient has had some healing this week. The area on her right lateral fifth metatarsal head, right third toe are both healed lower right leg is healed. In the vein harvest site superiorly she has one superficial open area. On the dorsal aspect of her right great toe/MTP joint the wound is now divided into 2 however the proximal area is deep and there is palpable bone 01/01/17; still an open area in the middle of her original right surgical scar. The area on the right third toe and right lateral fifth metatarsal head remained closed. Problematic area on the dorsal aspect of the toe. Previous surgery in this area line 01/08/17 small open area on the original scar on her upper anterior leg although this is closing. X-ray I did of the right first toe did not show underlying bony abnormality. Still this area on the dorsal first toe probes to bone. We have been using Prisma 01/15/17; small open area in the original scar in the upper anterior leg is almost fully closed. She has 2 open areas over the dorsal aspect of the right first toe that probes to bone. Used and a form starting last week. Vigorous bone scraping that I did last week showed few methicillin sensitive staph aureus.  She is allergic to penicillin and sulfa drugs. I'm going to give her 2 weeks of doxycycline. She will need an MRI with contrast. 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 She continues on doxycycline for a bone scraping that showed methicillin sensitive staph aureus [allergic to penicillin and sulfa]. We have been using Endo form to the wound. 01/29/17; surprisingly her MRI did not show osteomyelitis. She continues on doxycycline for a bone scraping that showed methicillin sensitive staph aureus with allergies to penicillin and sulfa. We have been using Endo form to the wound. Unfortunately I cannot get a surface on this visit looks like it is able to support a healing state. Proximally there is still exposed bone. There is no overt soft tissue tenderness. Her MRI did show a previous fracture was surgery to this area but no hardware 02/12/17 on evaluation today patient appears to be doing well in regard to her lower extremity wound. She  does have some mild discomfort but this is minimal. There's no evidence of infection. Her left great toe nail has somewhat been lifted up and there is a little bit of slight bleeding underneath but this is still firmly attached. 02/19/17; she ran out of doxycycline 2 days ago. Nevertheless I would like to continue this for 2 weeks to make a full 6 weeks of therapy as the bone scraping that I did from the open area on the dorsal right first toe showed MRSA. This should complete antibiotic therapy. 02/26/17; the patient is completing her 6 weeks of oral doxycycline. Deep wound on the dorsal right toe. This still has some exposed bone proximally. She does have a reasonably granulated surface albeit thin over bone. I've been using Endo form have made application for an amniotic skin sub 03/05/17; the patient has completed 6 weeks of doxycycline. This is a deep wound on the dorsal right toe which  has exposed bone proximally. She has granulation over most of this wound albeit a thin layer. I've been using Endo form. Still do not have approval for Affinity 03/13/17 on evaluation today patient's right foot wound appears to be doing better measurement wise compared to her last evaluation. She has been tolerating the dressing change without complication and does have a appointment with the dietary that has been made as far as referral is concerned there just waiting for her and transportation to contact them back for an actual date. Nonetheless patient has been having less pain at this point. No fevers, chills, nausea, or vomiting noted at this time. 03/26/17; linear wound over the dorsal aspect of her right great toe. At one point this had exposed bone on the most superior aspect however I am pleased to see today that this appears to have a surface of granulation. Apparently product was applied for but denied by [facility]. We'll need to see what that was. The patient has known PAD status post revascularization. MRI did not show osteomyelitis which was done in June 04/02/17; continued improvement using Endoform. She was approved for Oasis but hasn't over $741 co-pay per application, [name], [name] ([id number]) this is beyond her means 04/09/17; continues on Endoform. 04/16/17; appears to be doing nicely continuing on Endoform 04/22/17; patient did not have her dressing changed all week because of the weather. Using Endoform. Base of the wound looks healthy elbow there appears to events surrounding maceration perhaps because of the drainage was not changing the wound dressing 04/30/17 wound today continues to close and except for a small divot on the superior part of the wound. This area did not probe the bone but I found this a little concerning as this was the area with exposed bone before we be able to get this to granulate forward. As I remember things she is not a candidate for skin substitutes  secondary to an outlandish co-pay. I asked her husband to look into the out of pocket max for medications if he has 1 [Regranex] or totally for skin substitutes 05/07/17; the patient arrives today with a wound roughly the same size although under careful inspection under the light there appears to be some epithelialization. Her intake nurse noted that the Endoform seemed to be placed over the wound rather than in the deeper divots they could really benefit from the Endoform. At this point I have no plans to change the Endoform as at one point this was at least 33% exposed bone and the rest of the wound very close to  the underlying phalanx 05/14/17; no major change in the size or appearance of the wound. We have been using Endoform for a prolonged period of time with reasonably good improvement in the epithelialization i.e. no exposed bone especially proximally. Unless we've not made a lot of changes in the last several weeks. She does not complain of pain. She was revascularized early this year or perhaps late last year by Dr. Bridgett Larsson and I wonder if he will need to have a look at her, I will need to review the actual vascular procedure which I think was a distal popliteal bypass 05/21/17; switched to either for a blue last week. Patient arrived in clinic with the wound not measuring any better but the surface looking some better. Unfortunately she had some surface slough and when I went to remove this superiorly she had exposed bone. Previously she had had exposed bone in the inferior part of the wound however this is granulated over some weeks ago. Clearly this is a major step back for her. With some difficulty I was able to obtain a piece of the bone probing through the superior part of the wound for culture. We did not send pathology. It is likely that she is going to need imaging of this site but I did not order this today 05/28/17; bone culture I took last week showed MSSA. She still has exposed  bone however I could not get another piece for pathology. She had an MRI 4 months ago that did not show osteoarthritic at time. Wound rapidly deteriorated superiorly and I suspect this is underlying osteomyelitis. We have been using Hydrofera Blue 06/04/17 on evaluation today patient's wound appears to actually be doing a little bit better visually compared to last week's evaluation which is good news. Nonetheless she does have osteomyelitis of the toe which does not appear to be MRSA. No fevers, chills, nausea, or vomiting noted at this time. She is having no discomfort. 06/11/17; patient's wound looks a little healthier than I remember seeing it 2 weeks ago. There is no exposed bone and the surface of the wound appears well granulated. We've been using hydrofera blue. I note that she was started on doxycycline which was MSSA. Her appointment with Dr. Ola Spurr is on November 12 I believe. 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 Vallarie Mare of vascular surgery on 11/9. He offered right leg angiography possible intervention however the patient refused. I've discussed this with the patient's husband today she did not want any further procedures. Also noteworthy that Dr. Vallarie Mare stated that this wound had not healed in 3 years, I was not aware of this degree of chronicity in this area. She has underlying osteomyelitis by bone culture. Culture of this grew MSSA, she is allergic to penicillin and therefore not a candidate for beta lactams. She saw Dr. Ola Spurr of infectious disease this morning, he continued her on doxycycline for another month and apparently sent in a prescription. We have been using Hydrofera Blue. ABI's update by Dr. Bridgett Larsson right 0.62, left 0.89. Monophasic wave forms 06/25/17 on evaluation today patient appears to be doing well in regard to her right great toe wound. She is still taking the doxycycline as prescribed by Dr. Ola Spurr. The Hydrofera Blue  Dressing's to be doing as well as anything has for her up to this point and we are still waiting on an insurance authorization for the [employer]. She is having no pain which is good news. No fevers, chills, nausea, or vomiting  noted at this time. 07/02/17; still taking doxycycline as prescribed by Dr. Ola Spurr. Hydrofera Blue continues. We have no palpable bone. Still have not had had approval of Apligraf 08/06/16; the patient arrives today with [facility] even though we apparently had changed to Sorbact last time. Her co-pay for Regranex is $389 in discussion with the patient and her husband this was excessive. We'll continue with the Sorbact and standard dressings for now 08/20/17; we have been using sorbact. The wound bed still requires debridement. Wound measurements are slightly better. 09/03/17;using Sorbact.no debridement today. Wound measurements slightly better. There is some discoloration of the tip of her toelooks like bruising 09/17/17; using Sorbact. No debridement today. Wound dimensions are about the same. Still some discoloration at the tip of her great toe. This does not look any worse than last time 10/01/17; using sorbact right great toe I thought she might have surface epithelialization last time although it is certainly not Doble, Maeve White. ([id number]) look like that today nevertheless her dimensions are better. Continued surface eschar on the tip of her toe but this is not progressing. She is actually complaining of the left great toe 10/15/17; this is a very difficult area on the dorsal aspect of her proximal right great toe. At one point this wound had exposed bone however we have managed to get some degree of epithelialization. She was revascularized by Dr. Vallarie Mare in [place]. She saw Dr. Ola Spurr for underlying osteomyelitis. She is not a candidate for advanced treatment options [insurance issues]. She comes in today with a new area on the tip of her right great toe. The  exact history here is unclear. We have been using sorbact 10/29/17; patient arrives today with very significant bilateral increase edema which appears to extend into the thighs. This is tight and not particularly pitting however it looks a lot more impressive than I remember. Not surprisingly the wounds on her right great toe have increased in size with weeping edema fluid as the edema extends into the dorsal foot itself. She also has a wound on the tip of her right great toe which is a new wound from 2 weeks ago. We have been using sorbact. 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 He is on Demadex presumably twice a day at 20 mg. I have asked her family to make her an appointment with her primary physicians at the [place] clinic to go over this degree of edema. This is causing I think deterioration in the right great toe wound. She also has significant PAD and is status post right leg bypass graft. I think this was done by [facility]. I believe they also said that there was nothing further that could be done with regards to her vascular status. 11/12/17; patient is going to see her primary doctor this afternoon with regards to lower extremity edema. She has 2 open wounds on the right great toe the original wound on the dorsal proximal part and then a more necrotic looking area on the tip of her right great toe. We took some time today to review her vascular status. The patient had a relate below knee popliteal to peroneal artery bypass with reversed greater saphenous vein on 06/13/17; she also had endarterectomy of the midsegment of the peroneal artery. 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 I think at some point in time after that she was offered an arteriogram on  the right but she declined. I've used Silver collagen to her wounds recently. 11/26/17; patient has 2 open wounds on the right great toe tip as well as the dorsal aspect of her right great toe. She has known PAD. I've been trying to get her back to see vein and vascular in [facility] to see if there are options for endovascular surgery to help with perfusion although the patient and her husband may not want any more surgery. We've been using silver alginate because of maceration. She does not have an option for advanced treatment/skin substitutes 12/10/17; continued open wounds much the same as 2 weeks ago on the dorsal aspect of the right great toe on the right great toe tip. I've encouraged to follow-up with Dr. Bridgett Larsson of vein and vascular to explore the possibility that there may be correctable ischemia involved in nonhealing these areas. We made considerable progress on the dorsal right great toe however it is stalled recently. We do not have an advanced treatment option. Originally this had exposed bone however there is a granulated base. 12/24/17; patient went back to see vein and vascular in [place]. She was seen by Manus Gunning NP. ABIs on the right were noted to be 0.84 and TBI of the right and 0.62. On the left her ABI was 0.82 and her TBI of 0.75. Waveforms were monophasic at the posterior tibial and dorsalis pedis bilaterally. 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 Her husband reiterates today that she does not want an arteriogram therefore I'm not going to press the issue. We've been using silver collagen to the wounds on the tip of the right great toe and dorsal surface of the right great toe. 01/07/18; patient arrives today with the tip of her toe healed. An cerebral amount of slough over the wound on the  dorsal toe. As noted on 12/24/17 no options currently for revascularization the patient will not allow an arteriogram 01/21/18; typical of her right great toe remains healed. Still large amount of slough over the wound on the dorsal toe. No options for revascularization. We've been using Medihoney 02/04/18; no major change. Still a punched out area on the right great toe dorsal wound. Covered in a difficult necrotic debris. Electronic Signature(s) Signed: 02/06/2018 8:34:07 AM By: Linton Ham MD Entered By: Linton Ham on 02/04/2018 12:46:07 [name], [name] ([id number]) -------------------------------------------------------------------------------- Physical Exam Details Patient Name: Beeney, Berda White. Date of Service: 02/04/2018 10:15 AM Medical Record Number: [id number] Patient Account Number: [id number] Date of Birth/Sex: [birth year] (77 y.o. F) Treating RN: Cornell Barman Primary Care Provider: Beverlyn Roux Other Clinician: Referring Provider: Beverlyn Roux Treating Provider/Extender: Tito Dine in Treatment: 65 Constitutional Sitting or standing Blood Pressure is within target range for patient.. Pulse regular and within target range for patient.[name] Respirations regular, non-labored and within target range.. Temperature is normal and within the target range for the patient.[name] appears in no distress. Notes one exam oThe original wound on the dorsal aspect of the right great toe again has a necrotic surface. Using a #3 curet I debrided this is much as the patient couldn't stand. Hemostasis with direct pressure. Really no improvement in this. I'm going to give medihoney another 2 weeks. We have used  many dressings in this area and although we had success getting this off the bone itself this is stalled for a long period of time. We do not have advanced treatment options Electronic Signature(s) Signed: 02/06/2018 8:34:07 AM By: Linton Ham MD Entered By: Linton Ham on 02/04/2018  13:02:08 [name], [name] ([id number]) -------------------------------------------------------------------------------- Physician Orders Details Patient Name: Bowring, Neasia White. Date of Service: 02/04/2018 10:15 AM Medical Record Number: [id number] Patient Account Number: [id number] Date of Birth/Sex: [birth year] (77 y.o. F) Treating RN: Cornell Barman Primary Care Provider: Beverlyn Roux Other Clinician: Referring Provider: Beverlyn Roux Treating Provider/Extender: Tito Dine in Treatment: 110 Verbal / Phone Orders: No Diagnosis Coding Wound Cleansing Wound #7 Right,Dorsal Metatarsal head first o Clean wound with Normal Saline. Anesthetic (add to Medication List) Wound #7 Right,Dorsal Metatarsal head first o Topical Lidocaine 4% cream applied to wound bed prior to debridement (In Clinic Only). Primary Wound Dressing Wound #7 Right,Dorsal Metatarsal head first o Medihoney gel Secondary Dressing Wound #7 Right,Dorsal Metatarsal head first o ABD and Kerlix/Conform Dressing Change Frequency Wound #7 Right,Dorsal Metatarsal head first o Change Dressing Monday, Wednesday, Friday Follow-up Appointments Wound #7 Right,Dorsal Metatarsal head first o Return Appointment in 2 weeks. Edema Control Wound #7 Right,Dorsal Metatarsal head first o Elevate legs to the level of the heart and pump ankles as often as possible Additional Orders / Instructions Wound #7 Right,Dorsal Metatarsal head first o Increase protein intake. o Activity as tolerated Home Health Wound #7 Right,Dorsal Metatarsal head first o Continue [facility] Health Visits - [facility] weekly, [facility] o [place] Visits - [facility] twice weekly, [facility]-Wednesday o Home Health Nurse may visit [facility] to address patientos wound care needs. o Home Health Nurse may visit [facility] to address patientos wound care needs. o FACE TO FACE ENCOUNTER: MEDICARE and MEDICAID  PATIENTS: I certify that this patient is under my care and that I had a face-to-face encounter that meets the physician face-to-face encounter requirements with this [name], [name] ([id number]) patient on this date. The encounter with the patient was in whole or in part for the following MEDICAL CONDITION: (primary reason for [place]) MEDICAL NECESSITY: I certify, that based on my findings, NURSING services are a medically necessary home health service. HOME BOUND STATUS: I certify that my clinical findings support that this patient is homebound (i.e., Due to illness or injury, pt requires aid of supportive devices such as crutches, cane, wheelchairs, walkers, the use of special transportation or the assistance of another person to leave their place of residence. There is a normal inability to leave the home and doing so requires considerable and taxing effort. Other absences are for medical reasons / religious services and are infrequent or of short duration when for other reasons). o FACE TO FACE ENCOUNTER: MEDICARE and MEDICAID PATIENTS: I certify that this patient is under my care and that I had a face-to-face encounter that meets the physician face-to-face encounter requirements with this patient on this date. The encounter with the patient was in whole or in part for the following MEDICAL CONDITION: (primary reason for [place]) MEDICAL NECESSITY: I certify, that based on my findings, NURSING services are a medically necessary home health service. HOME BOUND STATUS: I certify that my clinical findings support that this patient is homebound (i.e., Due to illness or injury, pt requires aid of supportive devices such as crutches, cane, wheelchairs, walkers, the use of special transportation or the assistance of another person to leave  their place of residence. There is a normal inability to leave the home and doing so requires considerable and taxing effort. Other absences are  for medical reasons / religious services and are infrequent or of short duration when for other reasons). o If current dressing causes regression in wound condition, may D/C ordered dressing product/s and apply Normal Saline Moist Dressing daily until next [place] / Other MD appointment. [place] of regression in wound condition at [phone]. o If current dressing causes regression in wound condition, may D/C ordered dressing product/s and apply Normal Saline Moist Dressing daily until next [place] / Other MD appointment. [place] of regression in wound condition at [phone]. o Please direct any NON-WOUND related issues/requests for orders to patient's Primary Care Physician o Please direct any NON-WOUND related issues/requests for orders to patient's Primary Care Physician Electronic Signature(s) Signed: 02/04/2018 4:43:24 PM By: Gretta Cool, BSN, RN, CWS, Kim RN, BSN Signed: 02/06/2018 8:34:07 AM By: Linton Ham MD Entered By: Gretta Cool, BSN, RN, CWS, Kim on 02/04/2018 11:01:45 Crevier, Jochebed [name] ([id number]) -------------------------------------------------------------------------------- Problem List Details Patient Name: Grudzien, Arlie White. Date of Service: 02/04/2018 10:15 AM Medical Record Number: [id number] Patient Account Number: [id number] Date of Birth/Sex: 16-Jun-1941 (77 y.o. 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 She was offered an arteriogram but additional surgical Making : refused it. 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 ([id number]) -------------------------------------------------------------------------------- Progress Note Details Patient Name: Muska, Tyronza White. Date of Service: 02/04/2018 10:15 AM Medical Record Number: [id number] Patient Account Number: [id number] Date of Birth/Sex: 1941-04-13 (77 y.o. F) Treating RN: Cornell Barman Primary Care Provider: Beverlyn Roux Other Clinician: Referring Provider: Beverlyn Roux Treating Provider/Extender: Tito Dine in Treatment: 65 Subjective History of Present Illness (HPI) 08/13/16: This is a 77 year old woman who came predominantly for review of 3 cm in diameter circular wound to the left anterior lateral leg. She was in the ER on 08/01/16 I reviewed their notes. There was apparently pus coming out of the wound at that time and the patient  arrived requesting debridement which they don't do in the emergency room. Nevertheless I can't see that they did any x-rays. There were no cultures done. She is a type II diabetic and I  a note after the patient was in the clinic that she had a bypass graft from the popliteal to the tibial on the right on 02/28/16. She also had a right greater saphenous vein harvest on the same date for arterial bypass. She is going to have vascular studies including ABIs T ABIs on the right on 08/28/16. The patient's surgery was on 02/28/16 by Dr. Vallarie Mare she had a right below the knee popliteal artery to peroneal artery bypass with reverse greater saphenous vein and an endarterectomy of the mid segment peroneal artery. Postoperatively she had a strong mild monophasic peroneal signal with a pink foot. It would appear that the patient is had some nonhealing in the surgical saphenous vein harvest site on the left leg. Surprisingly looking [facility] I cannot see much information about this at all. Dr. Lucious Groves notes from 05/29/16 show that the patient's wounds "are not healed" the right first metatarsal wound healed but then opened back up. 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 At that point the patient had a wound VAC to her right calf with regards to the wounds on her dorsal right toe would appear that these are felt to be arterial wounds. She has had surgery on the metatarsal phalangeal in 2015 I Dr. Doran Durand secondary to a right metatarsal phalangeal joint fracture. She is apparently had discoloration around this area since then. 08/28/16; patient arrives with her wounds in much the same condition. The linear vein harvest site and the circular wound below it which I think was a blister. She also has to probing holes in her right great toe and a necrotic eschar on the right second toe. Because of these  being arterial wounds I reduced her compression from 3-2 layers this seems to of done satisfactorily she has not had any problems. I cannot see that she is actually had an x-ray ====== 11/06/16 the patient comes in for evaluation of her right lower extremity ulcers. She was here in January 2018 for 2 visits subsequently ended up in the hospital with pneumonia and then to rehabilitation. She has now been discharged from rehabilitation and is home. She has multiple ulcerations to her right lower extremity including the foot and toes. She does have home health in place and they have been placing alginate to the ulcers. She is followed by Dr. Bridgett Larsson of vascular medicine. 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 She recently saw him on 3/23. In office ABIs were: Right 0.48 with monophasic flow to the DP, PT, peroneal Left 0.63 with monophasic flow to the DP and PT Her arterial studies indicated a patent right below knee popliteal to peroneal bypass She had an MRI in February 2008 that was negative frosty myelitis but this showed general soft tissue edema in the right foot and lower extremity concerning for cellulitis She is a diabetic, managed with insulin. Her hemoglobin A1c in December 2017 was 8.4 which is a trend up from previous levels. She had blood work in February 2018 which revealed an albumin of 2.6 this appears to be relatively acute as an albumin in November 2017 was 3.7 11/13/16; this is a patient I have not seen since February who is readmitted to our clinic last week. She is a type II diabetic on insulin with known severe PAD status post revascularization in the left  leg by Dr. Bridgett Larsson. I have reviewed Dr. Lianne Moris notes from March/23/18. Doppler ABI on that date showed an ABI on the right of 0.48 and on the left of 0.63. Dorsalis pedis waveforms were monophasic bilaterally. There was no waveforms detected at the posterior  tibial on the right, monophasic on the left. Dr. Lianne Moris comments were that this patient would have follow-up vascular studies in 3 months including ABIs and right lower Riveron, Joni White. ([id number]) extremity arterial duplex. She had an MRI in February that was negative for osteomyelitis but showed generalized edema in the foot. Last albumin I see was in January at 3.4 we have been using Santyl to the 4 wounds on the right leg the patient is noted today to have widespread edema well up towards her groin this is pitting 2-3+. 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 Normal ejection fraction. 11/20/16; patient has a follow-up appointment with Dr. Bridgett Larsson on April 23. She is still complaining of a lot of pain in the right foot and right leg. It is not clear to me that this is at all positional however I think it is clear claudication with minimal activity perhaps at rest. At our suggestion she is return to her primary physician's office tomorrow with regards to her pitting lower extremity bilateral edema that I reviewed in detail last week 11/27/16; the follow-up with Dr. Bridgett Larsson was actually on May 23 on April 23 as I stated in my note last week. N/A case all of her wounds seems somewhat smaller. The 2 on the right leg are definitely smaller. The areas on the dorsal right first toe, right third toe and the lateral part of the right fifth metatarsal head all looks smaller but have tightly adherent surfaces. We have been using Santyl 12/04/16; follow-up with Dr. Bridgett Larsson on May 23. 2 small open wounds on the right leg continue to get smaller. The area on the right third total lateral aspect of the right fifth toe also look better. The remaining area on the dorsal first toe still has some depth to it. We have been using Santyl to the toes and collagen on the right 12/11/16; according to patient's husband the follow-up with Dr. Bridgett Larsson is not until July. All of the  wounds on the right leg are measuring smaller. We have been using some combination of Prisma and Santyl although I think we can go to straight Prisma today. There may have been some confusion with home health about the primary dressing orders here. 12/25/16; the patient has had some healing this week. The area on her right lateral fifth metatarsal head, right third toe are both healed lower right leg is healed. In the vein harvest site superiorly she has one superficial open area. On the dorsal aspect of her right great toe/MTP joint the wound is now divided into 2 however the proximal area is deep and there is palpable bone 01/01/17; still an open area in the middle of her original right surgical scar. The area on the right third toe and right lateral fifth metatarsal head remained closed. Problematic area on the dorsal aspect of the toe. Previous surgery in this area line 01/08/17 small open area on the original scar on her upper anterior leg although this is closing. X-ray I did of the right first toe did not show underlying bony abnormality. Still this area on the dorsal first toe probes to bone. We have been using Prisma 01/15/17; small open  area in the original scar in the upper anterior leg is almost fully closed. She has 2 open areas over the dorsal aspect of the right first toe that probes to bone. Used and a form starting last week. Vigorous bone scraping that I did last week showed few methicillin sensitive staph aureus. She is allergic to penicillin and sulfa drugs. I'm going to give her 2 weeks of doxycycline. She will need an MRI with contrast. 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 She continues on doxycycline for a bone scraping that showed methicillin sensitive staph aureus [allergic to penicillin and sulfa]. We have been using Endo form to the wound. 01/29/17; surprisingly her MRI did not show osteomyelitis.  She continues on doxycycline for a bone scraping that showed methicillin sensitive staph aureus with allergies to penicillin and sulfa. We have been using Endo form to the wound. Unfortunately I cannot get a surface on this visit looks like it is able to support a healing state. Proximally there is still exposed bone. There is no overt soft tissue tenderness. Her MRI did show a previous fracture was surgery to this area but no hardware 02/12/17 on evaluation today patient appears to be doing well in regard to her lower extremity wound. She does have some mild discomfort but this is minimal. There's no evidence of infection. Her left great toe nail has somewhat been lifted up and there is a little bit of slight bleeding underneath but this is still firmly attached. 02/19/17; she ran out of doxycycline 2 days ago. Nevertheless I would like to continue this for 2 weeks to make a full 6 weeks of therapy as the bone scraping that I did from the open area on the dorsal right first toe showed MRSA. This should complete antibiotic therapy. 02/26/17; the patient is completing her 6 weeks of oral doxycycline. Deep wound on the dorsal right toe. This still has some exposed bone proximally. She does have a reasonably granulated surface albeit thin over bone. I've been using Endo form have made application for an amniotic skin sub 03/05/17; the patient has completed 6 weeks of doxycycline. This is a deep wound on the dorsal right toe which has exposed bone proximally. She has granulation over most of this wound albeit a thin layer. I've been using Endo form. Still do not have approval for [employer] 03/13/17 on evaluation today patient's right foot wound appears to be doing better measurement wise compared to her last evaluation. She has been tolerating the dressing change without complication and does have a appointment with the dietary that has been made as far as referral is concerned there just waiting for her and  transportation to contact them back for an actual date. Nonetheless patient has been having less pain at this point. No fevers, chills, nausea, or vomiting noted at this time. 03/26/17; linear wound over the dorsal aspect of her right great toe. At one point this had exposed bone on the most superior aspect however I am pleased to see today that this appears to have a surface of granulation. Apparently product was applied for but denied by [facility]. We'll need to see what that was. The patient has known PAD status post revascularization. MRI did not show osteomyelitis which was done in June Cataldi, Nanea White. ([id number]) 04/02/17; continued improvement using Endoform. She was approved for [facility] but hasn't over $814 co-pay per application, this is  beyond her means 04/09/17; continues on Endoform. 04/16/17; appears to be doing nicely continuing on Endoform 04/22/17; patient did not have her dressing changed all week because of the weather. Using Endoform. Base of the wound looks healthy elbow there appears to events surrounding maceration perhaps because of the drainage was not changing the wound dressing 04/30/17 wound today continues to close and except for a small divot on the superior part of the wound. This area did not probe the bone but I found this a little concerning as this was the area with exposed bone before we be able to get this to granulate forward. As I remember things she is not a candidate for skin substitutes secondary to an outlandish co-pay. I asked her husband to look into the out of pocket max for medications if he has 1 [Regranex] or totally for skin substitutes 05/07/17; the patient arrives today with a wound roughly the same size although under careful inspection under the light there appears to be some epithelialization. Her intake nurse noted that the Endoform seemed to be placed over the wound rather than in the deeper divots they could really benefit from the Endoform. At this  point I have no plans to change the Endoform as at one point this was at least 33% exposed bone and the rest of the wound very close to the underlying phalanx 05/14/17; no major change in the size or appearance of the wound. We have been using Endoform for a prolonged period of time with reasonably good improvement in the epithelialization i.e. no exposed bone especially proximally. Unless we've not made a lot of changes in the last several weeks. She does not complain of pain. She was revascularized early this year or perhaps late last year by Dr. Bridgett Larsson and I wonder if he will need to have a look at her, I will need to review the actual vascular procedure which I think was a distal popliteal bypass 05/21/17; switched to either for a blue last week. Patient arrived in clinic with the wound not measuring any better but the surface looking some better. Unfortunately she had some surface slough and when I went to remove this superiorly she had exposed bone. Previously she had had exposed bone in the inferior part of the wound however this is granulated over some weeks ago. Clearly this is a major step back for her. With some difficulty I was able to obtain a piece of the bone probing through the superior part of the wound for culture. We did not send pathology. It is likely that she is going to need imaging of this site but I did not order this today 05/28/17; bone culture I took last week showed MSSA. She still has exposed bone however I could not get another piece for pathology. She had an MRI 4 months ago that did not show osteoarthritic at time. Wound rapidly deteriorated superiorly and I suspect this is underlying osteomyelitis. We have been using Hydrofera Blue 06/04/17 on evaluation today patient's wound appears to actually be doing a little bit better visually compared to last week's evaluation which is good news. Nonetheless she does have osteomyelitis of the toe which does not appear to be  MRSA. No fevers, chills, nausea, or vomiting noted at this time. She is having no discomfort. 06/11/17; patient's wound looks a little healthier than I remember seeing it 2 weeks ago. There is no exposed bone and the surface of the wound appears well granulated. We've been using hydrofera blue.  I note that she was started on doxycycline which was MSSA. Her appointment with Dr. Ola Spurr is on November 12 I believe. 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 Vallarie Mare of vascular surgery on 11/9. He offered right leg angiography possible intervention however the patient refused. I've discussed this with the patient's husband today she did not want any further procedures. Also noteworthy that Dr. Vallarie Mare stated that this wound had not healed in 3 years, I was not aware of this degree of chronicity in this area. She has underlying osteomyelitis by bone culture. Culture of this grew MSSA, she is allergic to penicillin and therefore not a candidate for beta lactams. She saw Dr. Ola Spurr of infectious disease this morning, he continued her on doxycycline for another month and apparently sent in a prescription. We have been using Hydrofera Blue. ABI's update by Dr. Bridgett Larsson right 0.62, left 0.89. Monophasic wave forms 06/25/17 on evaluation today patient appears to be doing well in regard to her right great toe wound. She is still taking the doxycycline as prescribed by Dr. Ola Spurr. The Hydrofera Blue Dressing's to be doing as well as anything has for her up to this point and we are still waiting on an insurance authorization for the [place]. She is having no pain which is good news. No fevers, chills, nausea, or vomiting noted at this time. 07/02/17; still taking doxycycline as prescribed by Dr. Ola Spurr. Hydrofera Blue continues. We have no palpable bone. Still have not had had approval of Apligraf 08/06/16; the patient arrives today with [facility] even though we apparently  had changed to Sorbact last time. Her co-pay for Regranex is $389 in discussion with the patient and her husband this was excessive. We'll continue with the Sorbact and standard dressings for now 08/20/17; we have been using sorbact. The wound bed still requires debridement. Wound measurements are slightly better. 09/03/17;using Sorbact.no debridement today. Wound measurements slightly better. There is some discoloration of the tip of her toelooks like bruising 09/17/17; using Sorbact. No debridement today. Wound dimensions are about the same. Still some discoloration at the tip of her great toe. This does not look any worse than last time [name], [name] ([id number]) 10/01/17; using sorbact right great toe I thought she might have surface epithelialization last time although it is certainly not look like that today nevertheless her dimensions are better. Continued surface eschar on the tip of her toe but this is not progressing. She is actually complaining of the left great toe 10/15/17; this is a very difficult area on the dorsal aspect of her proximal right great toe. At one point this wound had exposed bone however we have managed to get some degree of epithelialization. She was revascularized by Dr. Vallarie Mare in [place]. She saw Dr. Ola Spurr for underlying osteomyelitis. She is not a candidate for advanced treatment options [insurance issues]. She comes in today with a new area on the tip of her right great toe. The exact history here is unclear. We have been using sorbact 10/29/17; patient arrives today with very significant bilateral increase edema which appears to extend into the thighs. This is tight and not particularly pitting however it looks a lot more impressive than I remember. Not surprisingly the wounds on her right great toe have increased in size with weeping edema fluid as the edema extends into the dorsal foot itself. She also has a wound on the tip of her right great toe which is a  new wound from 2 weeks  ago. We have been using sorbact. 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 He is on Demadex presumably twice a day at 20 mg. I have asked her family to make her an appointment with her primary physicians at the [place] clinic to go over this degree of edema. This is causing I think deterioration in the right great toe wound. She also has significant PAD and is status post right leg bypass graft. I think this was done by [facility]. I believe they also said that there was nothing further that could be done with regards to her vascular status. 11/12/17; patient is going to see her primary doctor this afternoon with regards to lower extremity edema. She has 2 open wounds on the right great toe the original wound on the dorsal proximal part and then a more necrotic looking area on the tip of her right great toe. We took some time today to review her vascular status. The patient had a relate below knee popliteal to peroneal artery bypass with reversed greater saphenous vein on 06/13/17; she also had endarterectomy of the midsegment of the peroneal artery. 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 I think at some point in time after that she was offered an arteriogram on the right but she declined. I've used Silver collagen to her wounds recently. 11/26/17; patient has 2 open wounds on the right great toe tip as well as the dorsal aspect of her right great toe. She has known PAD. I've been trying to get her back to see vein and vascular in [facility] to see if there are options for endovascular surgery to help with perfusion although the patient and her husband may not want any more surgery. We've been using silver alginate because of maceration. She  does not have an option for advanced treatment/skin substitutes 12/10/17; continued open wounds much the same as 2 weeks ago on the dorsal aspect of the right great toe on the right great toe tip. I've encouraged to follow-up with Dr. Bridgett Larsson of vein and vascular to explore the possibility that there may be correctable ischemia involved in nonhealing these areas. We made considerable progress on the dorsal right great toe however it is stalled recently. We do not have an advanced treatment option. Originally this had exposed bone however there is a granulated base. 12/24/17; patient went back to see vein and vascular in [place]. She was seen by Manus Gunning NP. ABIs on the right were noted to be 0.84 and TBI of the right and 0.62. On the left her ABI was 0.82 and her TBI of 0.75. Waveforms were monophasic at the posterior tibial and dorsalis pedis bilaterally. 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 Her husband reiterates today that she does not want an arteriogram therefore I'm not going to press the issue. We've been using silver collagen to the wounds on the tip of the right great toe and dorsal surface of the right great toe. 01/07/18; patient arrives today with the tip of her toe healed. An cerebral amount of slough over the wound on the dorsal toe. As noted on 12/24/17 no options currently  for revascularization the patient will not allow an arteriogram 01/21/18; typical of her right great toe remains healed. Still large amount of slough over the wound on the dorsal toe. No options for revascularization. We've been using Medihoney 02/04/18; no major change. Still a punched out area on the right great toe dorsal wound. Covered in a difficult necrotic debris. [name], [name] ([id number]) Objective Constitutional Sitting or  standing Blood Pressure is within target range for patient.. Pulse regular and within target range for patient.[name] Respirations regular, non-labored and within target range.. Temperature is normal and within the target range for the patient.[name] appears in no distress. Vitals Time Taken: 10:22 AM, Height: 64 in, Weight: 200 lbs, BMI: 34.3, Temperature: 97.8 F, Pulse: 73 bpm, Respiratory Rate: 18 breaths/min, Blood Pressure: 138/67 mmHg. General Notes: one exam The original wound on the dorsal aspect of the right great toe again has a necrotic surface. Using a #3 curet I debrided this is much as the patient couldn't stand. Hemostasis with direct pressure. Really no improvement in this. I'm going to give medihoney another 2 weeks. We have used many dressings in this area and although we had success getting this off the bone itself this is stalled for a long period of time. We do not have advanced treatment options Integumentary (Hair, Skin) Wound #7 status is Open. Original cause of wound was Gradually Appeared. The wound is located on the Right,Dorsal Metatarsal head first. The wound measures 2.5cm length x 0.8cm width x 0.2cm depth; 1.571cm^2 area and 0.314cm^3 volume. There is no tunneling or undermining noted. There is a large amount of serous drainage noted. The wound margin is distinct with the outline attached to the wound base. There is small (1-33%) pink granulation within the wound bed. There is a large (67-100%) amount of necrotic tissue within the wound bed including Adherent Slough. Periwound temperature was noted as No Abnormality. The periwound has tenderness on palpation. 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 There was a Excisional Skin/Subcutaneous Tissue Debridement with a total area of 2 sq cm performed by Ricard Dillon, MD. With the following instrument(s): Curette to remove Viable and Non-Viable tissue/material. Material removed includes Subcutaneous Tissue and Slough and after achieving pain control using Other (lidocaine 4%). No specimens were taken. A time out was conducted at 10:55, prior to the start of the procedure. A Minimum amount of bleeding was controlled with Pressure. The procedure was tolerated well. Patient s Level of Consciousness post procedure was recorded as Awake and Alert. Post Debridement Measurements: 2.5cm length x 0.8cm width x 0.3cm depth; 0.471cm^3 volume. Character of Wound/Ulcer Post Debridement is stable. Severity of Tissue Post Debridement is: Fat layer exposed. Post procedure Diagnosis Wound #7: Same as Pre-Procedure [name], [name] ([id number]) Plan Wound Cleansing: Wound #7 Right,Dorsal Metatarsal head first: Clean wound with Normal Saline. Anesthetic (add to Medication List): Wound #7 Right,Dorsal Metatarsal head first: Topical Lidocaine 4% cream applied to wound bed prior to debridement (In Clinic Only). Primary Wound Dressing: Wound #7 Right,Dorsal Metatarsal head  first: Medihoney gel Secondary Dressing: Wound #7 Right,Dorsal Metatarsal head first: ABD and Kerlix/Conform Dressing Change Frequency: Wound #7 Right,Dorsal Metatarsal head first: Change Dressing Monday, Wednesday, Friday Follow-up Appointments: Wound #7 Right,Dorsal Metatarsal head first: Return Appointment in 2 weeks. Edema Control: Wound #7 Right,Dorsal Metatarsal head first: Elevate legs to the level of the heart and  pump ankles as often as possible Additional Orders / Instructions: Wound #7 Right,Dorsal Metatarsal head first: Increase protein intake. Activity as tolerated Home Health: Wound #7 Right,Dorsal Metatarsal head first: [place] [facility], [facility] Continue Home Health Visits - [facility] weekly, [facility] Home Health Nurse may visit [facility] to address patient s wound care needs. Home Health Nurse may visit [facility] to address patient s wound care needs. FACE TO FACE ENCOUNTER: MEDICARE and MEDICAID PATIENTS: I certify that this patient is under my care and that I had a face-to-face encounter that meets the physician face-to-face encounter requirements with this patient on this date. The encounter with the patient was in whole or in part for the following MEDICAL CONDITION: (primary reason for [place]) MEDICAL NECESSITY: I certify, that based on my findings, NURSING services are a medically necessary home health service. HOME BOUND STATUS: I certify that my clinical findings support that this patient is homebound (i.e., Due to illness or injury, pt requires aid of supportive devices such as crutches, cane, wheelchairs, walkers, the use of special transportation or the assistance of another person to leave their place of residence. There is a normal inability to leave the home and doing so requires considerable and taxing effort. Other absences are for medical reasons / religious services and are infrequent or of short duration when for other reasons). FACE TO FACE ENCOUNTER: MEDICARE and MEDICAID PATIENTS: I certify that this patient is under my care and that I had a face-to-face encounter that meets the physician face-to-face encounter requirements with this patient on this date. The encounter with the patient was in whole or in part for the following MEDICAL CONDITION: (primary reason for [place]) MEDICAL  NECESSITY: I certify, that based on my findings, NURSING services are a medically necessary home health service. HOME BOUND STATUS: I certify that my clinical findings support that this patient is homebound (i.e., Due to illness or injury, pt requires aid of supportive devices such as crutches, cane, wheelchairs, walkers, the use of special transportation or the assistance of another person to leave their place of residence. There is a normal inability to leave the home and doing so requires considerable and taxing effort. Other absences are for medical reasons / religious services and are infrequent or of short duration when for other reasons). If current dressing causes regression in wound condition, may D/C ordered dressing product/s and apply Normal Saline Moist Dressing daily until next [place] / Other MD appointment. [place] of regression in [place], Alyssandra White. ([id number]) wound condition at [phone]. If current dressing causes regression in wound condition, may D/C ordered dressing product/s and apply Normal Saline Moist Dressing daily until next [place] / Other MD appointment. [place] of regression in wound condition at [phone]. 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 We've been  using Medihoney Interventions: continue Medihoney 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 of foot 11/06/2016 Status: No change Complications: she does not have an option for revascularization that she is elected to pursue. She was offered an arteriogram but refused  it. Interventions: at this point no additional surgical options are present #1 I'm continuing with the same dressing #2 we do not have the options for an advanced treatment option. #3 no revascularization options #4 I've used many different topical dressings on this including Iodoflex and Santyl and sorbact. Electronic Signature(s) Signed: 02/06/2018 8:34:07 AM By: Linton Ham MD Entered By: Linton Ham on 02/04/2018 13:03:35 [place], [place] ([id number]) -------------------------------------------------------------------------------- SuperBill Details Patient Name: Friday, Lynnleigh White. Date of Service: 02/04/2018 Medical Record Number: [id number] Patient Account Number: [id number] Date of Birth/Sex: 1940-08-08 (77 y.o. 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 E11.52 Type 2 diabetes mellitus with diabetic peripheral angiopathy with gangrene I70.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 of foot Facility Procedures CPT4 Code Description: [id number] [place] - DEB SUBQ TISSUE 20 SQ CM/< ICD-10 Diagnosis Description L97.519 Non-pressure chronic ulcer of other part of right foot with unspe Modifier: cified severit Quantity: 1 y Physician Procedures CPT4 Code Description: [id number] [id number] - WC PHYS SUBQ TISS 20 SQ CM ICD-10 Diagnosis Description L97.519 Non-pressure chronic ulcer of other part of right foot with unspec Modifier: ified severit Quantity: 1 y Engineer, maintenance) Signed: 02/06/2018 8:34:07 AM By: Linton Ham MD Entered By: Linton Ham on 02/04/2018 13:04:23

## 2018-02-18 ENCOUNTER — Encounter: Payer: Medicare HMO | Admitting: Internal Medicine

## 2018-02-18 DIAGNOSIS — E1151 Type 2 diabetes mellitus with diabetic peripheral angiopathy without gangrene: Secondary | ICD-10-CM | POA: Diagnosis not present

## 2018-02-21 NOTE — Progress Notes (Signed)
BRAILEIGH, LANDENBERGER ([id number]) Visit Report for 02/18/2018 Debridement Details Patient Name: [name], [name] Date of Service: 02/18/2018 10:00 AM Medical Record Number: [id number] Patient Account Number: [id number] Date of Birth/Sex: 1941/06/05 (77 y.o. F) Treating RN: Montey Hora Primary Care Provider: Beverlyn Roux Other Clinician: Referring Provider: Beverlyn Roux Treating Provider/Extender: Tito Dine in Treatment: 67 Debridement Performed for Wound #7 Right,Dorsal Metatarsal head first Assessment: Performed By: Physician Ricard Dillon, MD Debridement Type: Debridement Severity of Tissue Pre Fat layer exposed Debridement: Pre-procedure Verification/Time Yes - 10:43 Out Taken: Start Time: 10:43 Total Area Debrided (L x W): 3 (cm) x 1.2 (cm) = 3.6 (cm) Tissue and other material Viable, Non-Viable, Slough, Subcutaneous, Biofilm, Slough debrided: Level: Skin/Subcutaneous Tissue Debridement Description: Excisional Instrument: Curette Bleeding: Minimum Hemostasis Achieved: Pressure End Time: 10:44 Procedural Pain: 0 Post Procedural Pain: 0 Response to Treatment: Procedure was tolerated well Level of Consciousness: Awake and Alert Post Debridement Measurements of Total Wound Length: (cm) 3 Width: (cm) 1.2 Depth: (cm) 0.2 Volume: (cm) 0.565 Character of Wound/Ulcer Post Debridement: Stable Severity of Tissue Post Debridement: Fat layer exposed Post Procedure Diagnosis Same as Pre-procedure Electronic Signature(s) Signed: 02/18/2018 5:29:35 PM By: Montey Hora Signed: 02/20/2018 7:56:50 AM By: Linton Ham MD Entered By: Linton Ham on 02/18/2018 12:36:34 [name], [name] [name] ([id number]) -------------------------------------------------------------------------------- HPI Details Patient Name: [name], [name] Date of Service: 02/18/2018 10:00 AM Medical Record Number: [id number] Patient Account Number: [id number] Date of Birth/Sex: [birth year] (77 y.o.  F) Treating RN: Montey Hora Primary Care Provider: Beverlyn Roux Other Clinician: Referring Provider: Beverlyn Roux Treating Provider/Extender: Tito Dine in Treatment: 41 History of Present Illness HPI Description: 08/13/16: This is a 77 year old woman who came predominantly for review of 3 cm in diameter circular wound to the left anterior lateral leg. She was in the ER on 08/01/16 I reviewed their notes. There was apparently pus coming out of the wound at that time and the patient arrived requesting debridement which they don't do in the emergency room. Nevertheless I can't see that they did any x-rays. There were no cultures done. She is a type II diabetic and I a note after the patient was in the clinic that she had a bypass graft from the popliteal to the tibial on the right on 02/28/16. She also had a right greater saphenous vein harvest on the same date for arterial bypass. She is going to have vascular studies including ABIs T ABIs on the right on 08/28/16. The patient's surgery was on 02/28/16 by Dr. Vallarie Mare she had a right below the knee popliteal artery to peroneal artery bypass with reverse greater saphenous vein and an endarterectomy of the mid segment peroneal artery. Postoperatively she had a strong mild monophasic peroneal signal with a pink foot. It would appear that the patient is had some nonhealing in the surgical saphenous vein harvest site on the left leg. Surprisingly looking through [facility] I cannot see much information about this at all. Dr. Lucious Groves notes from 05/29/16 show that the patient's wounds "are not healed" the right first metatarsal wound healed but then opened back up. 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 At that point the patient had a wound VAC to her right calf with regards to the wounds on her dorsal right toe would appear that these  are felt to be arterial wounds. She has had surgery on the metatarsal phalangeal in 2015  I Dr. Doran Durand secondary to a right metatarsal phalangeal joint fracture. She is apparently had discoloration around this area since then. 08/28/16; patient arrives with her wounds in much the same condition. The linear vein harvest site and the circular wound below it which I think was a blister. She also has to probing holes in her right great toe and a necrotic eschar on the right second toe. Because of these being arterial wounds I reduced her compression from 3-2 layers this seems to of done satisfactorily she has not had any problems. I cannot see that she is actually had an x-ray ====== 11/06/16 the patient comes in for evaluation of her right lower extremity ulcers. She was here in January 2018 for 2 visits subsequently ended up in the hospital with pneumonia and then to rehabilitation. She has now been discharged from rehabilitation and is home. She has multiple ulcerations to her right lower extremity including the foot and toes. She does have home health in place and they have been placing alginate to the ulcers. She is followed by Dr. Bridgett Larsson of vascular medicine. 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 She recently saw him on 3/23. In office ABIs were: Right 0.48 with monophasic flow to the DP, PT, peroneal Left 0.63 with monophasic flow to the DP and PT Her arterial studies indicated a patent right below knee popliteal to peroneal bypass She had an MRI in February 2008 that was negative frosty myelitis but this showed general soft tissue edema in the right foot and lower extremity concerning for cellulitis She is a diabetic, managed with insulin. Her hemoglobin A1c in December 2017 was 8.4 which is a trend up from previous levels. She had blood work in February 2018 which revealed an albumin of 2.6 this appears to be relatively  acute as an albumin in November 2017 was 3.7 11/13/16; this is a patient I have not seen since February who is readmitted to our clinic last week. 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 Bridgett Larsson. I have reviewed Dr. Lianne Moris notes from March/23/18. Doppler ABI on that date showed an ABI on the right of 0.48 and on the left of 0.63. Dorsalis pedis waveforms were monophasic bilaterally. There was no waveforms detected at the posterior tibial on the right, monophasic on the left. Dr. Lianne Moris comments were that this patient would have follow-up vascular studies in 3 months including ABIs and right lower extremity arterial duplex. She had an MRI in February that was negative for osteomyelitis but showed generalized edema in Bown, Aerika V. ([id number]) the foot. Last albumin I see was in January at 3.4 we have been using Santyl to the 4 wounds on the right leg the patient is noted today to have widespread edema well up towards her groin this is pitting 2-3+. 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 Normal ejection fraction. 11/20/16; patient has a follow-up appointment with Dr. Bridgett Larsson on April 23. She is still complaining of a lot of pain in the right foot and right leg. It is not clear to me that this is at all positional however I think it is clear claudication with minimal activity perhaps at rest. At our suggestion she is return to her primary physician's office tomorrow with regards to her pitting lower extremity bilateral edema that  I reviewed in detail last week 11/27/16; the follow-up with Dr. Bridgett Larsson was actually on May 23 on April 23 as I stated in my note last week. N/A case all of her wounds seems somewhat smaller. The 2 on the right leg are definitely smaller. The areas on the dorsal right first toe, right third toe and the lateral part of the right fifth metatarsal head all  looks smaller but have tightly adherent surfaces. We have been using Santyl 12/04/16; follow-up with Dr. Bridgett Larsson on May 23. 2 small open wounds on the right leg continue to get smaller. The area on the right third total lateral aspect of the right fifth toe also look better. The remaining area on the dorsal first toe still has some depth to it. We have been using Santyl to the toes and collagen on the right 12/11/16; according to patient's husband the follow-up with Dr. Bridgett Larsson is not until July. All of the wounds on the right leg are measuring smaller. We have been using some combination of Prisma and Santyl although I think we can go to straight Prisma today. There may have been some confusion with home health about the primary dressing orders here. 12/25/16; the patient has had some healing this week. The area on her right lateral fifth metatarsal head, right third toe are both healed lower right leg is healed. In the vein harvest site superiorly she has one superficial open area. On the dorsal aspect of her right great toe/MTP joint the wound is now divided into 2 however the proximal area is deep and there is palpable bone 01/01/17; still an open area in the middle of her original right surgical scar. The area on the right third toe and right lateral fifth metatarsal head remained closed. Problematic area on the dorsal aspect of the toe. Previous surgery in this area line 01/08/17 small open area on the original scar on her upper anterior leg although this is closing. X-ray I did of the right first toe did not show underlying bony abnormality. Still this area on the dorsal first toe probes to bone. We have been using Prisma 01/15/17; small open area in the original scar in the upper anterior leg is almost fully closed. She has 2 open areas over the dorsal aspect of the right first toe that probes to bone. Used and a form starting last week. Vigorous bone scraping that I did last week showed few methicillin  sensitive staph aureus. She is allergic to penicillin and sulfa drugs. I'm going to give her 2 weeks of doxycycline. She will need an MRI with contrast. 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 She continues on doxycycline for a bone scraping that showed methicillin sensitive staph aureus [allergic to penicillin and sulfa]. We have been using Endo form to the wound. 01/29/17; surprisingly her MRI did not show osteomyelitis. She continues on doxycycline for a bone scraping that showed methicillin sensitive staph aureus with allergies to penicillin and sulfa. We have been using Endo form to the wound. Unfortunately I cannot get a surface on this visit looks like it is able to support a healing state. Proximally there is still exposed bone. There is no overt soft tissue tenderness. Her MRI did show a previous fracture was surgery to this area but no hardware 02/12/17 on evaluation today patient appears to be doing well in regard to her lower extremity wound. She  does have some mild discomfort but this is minimal. There's no evidence of infection. Her left great toe nail has somewhat been lifted up and there is a little bit of slight bleeding underneath but this is still firmly attached. 02/19/17; she ran out of doxycycline 2 days ago. Nevertheless I would like to continue this for 2 weeks to make a full 6 weeks of therapy as the bone scraping that I did from the open area on the dorsal right first toe showed MRSA. This should complete antibiotic therapy. 02/26/17; the patient is completing her 6 weeks of oral doxycycline. Deep wound on the dorsal right toe. This still has some exposed bone proximally. She does have a reasonably granulated surface albeit thin over bone. I've been using Endo form have made application for an amniotic skin sub 03/05/17; the patient has completed 6 weeks of doxycycline. This is a deep wound on the  dorsal right toe which has exposed bone proximally. She has granulation over most of this wound albeit a thin layer. I've been using Endo form. Still do not have approval for [employer] 03/13/17 on evaluation today patient's right foot wound appears to be doing better measurement wise compared to her last evaluation. She has been tolerating the dressing change without complication and does have a appointment with the dietary that has been made as far as referral is concerned there just waiting for her and transportation to contact them back for an actual date. Nonetheless patient has been having less pain at this point. No fevers, chills, nausea, or vomiting noted at this time. 03/26/17; linear wound over the dorsal aspect of her right great toe. At one point this had exposed bone on the most superior aspect however I am pleased to see today that this appears to have a surface of granulation. Apparently product was applied for but denied by [facility]. We'll need to see what that was. The patient has known PAD status post revascularization. MRI did not show osteomyelitis which was done in June 04/02/17; continued improvement using Endoform. She was approved for [employer] but hasn't over $308 co-pay per application, Harmon, Daana V. ([id number]) this is beyond her means 04/09/17; continues on Endoform. 04/16/17; appears to be doing nicely continuing on Endoform 04/22/17; patient did not have her dressing changed all week because of the weather. Using Endoform. Base of the wound looks healthy elbow there appears to events surrounding maceration perhaps because of the drainage was not changing the wound dressing 04/30/17 wound today continues to close and except for a small divot on the superior part of the wound. This area did not probe the bone but I found this a little concerning as this was the area with exposed bone before we be able to get this to granulate forward. As I remember things she is not a candidate  for skin substitutes secondary to an outlandish co-pay. I asked her husband to look into the out of pocket max for medications if he has 1 [Regranex] or totally for skin substitutes 05/07/17; the patient arrives today with a wound roughly the same size although under careful inspection under the light there appears to be some epithelialization. Her intake nurse noted that the Endoform seemed to be placed over the wound rather than in the deeper divots they could really benefit from the Endoform. At this point I have no plans to change the Endoform as at one point this was at least 33% exposed bone and the rest of the wound very close to  the underlying phalanx 05/14/17; no major change in the size or appearance of the wound. We have been using Endoform for a prolonged period of time with reasonably good improvement in the epithelialization i.e. no exposed bone especially proximally. Unless we've not made a lot of changes in the last several weeks. She does not complain of pain. She was revascularized early this year or perhaps late last year by Dr. Bridgett Larsson and I wonder if he will need to have a look at her, I will need to review the actual vascular procedure which I think was a distal popliteal bypass 05/21/17; switched to either for a blue last week. Patient arrived in clinic with the wound not measuring any better but the surface looking some better. Unfortunately she had some surface slough and when I went to remove this superiorly she had exposed bone. Previously she had had exposed bone in the inferior part of the wound however this is granulated over some weeks ago. Clearly this is a major step back for her. With some difficulty I was able to obtain a piece of the bone probing through the superior part of the wound for culture. We did not send pathology. It is likely that she is going to need imaging of this site but I did not order this today 05/28/17; bone culture I took last week showed MSSA.  She still has exposed bone however I could not get another piece for pathology. She had an MRI 4 months ago that did not show osteoarthritic at time. Wound rapidly deteriorated superiorly and I suspect this is underlying osteomyelitis. We have been using Hydrofera Blue 06/04/17 on evaluation today patient's wound appears to actually be doing a little bit better visually compared to last week's evaluation which is good news. Nonetheless she does have osteomyelitis of the toe which does not appear to be MRSA. No fevers, chills, nausea, or vomiting noted at this time. She is having no discomfort. 06/11/17; patient's wound looks a little healthier than I remember seeing it 2 weeks ago. There is no exposed bone and the surface of the wound appears well granulated. We've been using hydrofera blue. I note that she was started on doxycycline which was MSSA. Her appointment with Dr. Ola Spurr is on November 12 I believe. 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 Vallarie Mare of vascular surgery on 11/9. He offered right leg angiography possible intervention however the patient refused. I've discussed this with the patient's husband today she did not want any further procedures. Also noteworthy that Dr. Vallarie Mare stated that this wound had not healed in 3 years, I was not aware of this degree of chronicity in this area. She has underlying osteomyelitis by bone culture. Culture of this grew MSSA, she is allergic to penicillin and therefore not a candidate for beta lactams. She saw Dr. Ola Spurr of infectious disease this morning, he continued her on doxycycline for another month and apparently sent in a prescription. We have been using Hydrofera Blue. ABI's update by Dr. Bridgett Larsson right 0.62, left 0.89. Monophasic wave forms 06/25/17 on evaluation today patient appears to be doing well in regard to her right great toe wound. She is still taking the doxycycline as prescribed by Dr. Ola Spurr. The  Hydrofera Blue Dressing's to be doing as well as anything has for her up to this point and we are still waiting on an insurance authorization for the [place]. She is having no pain which is good news. No fevers, chills, nausea, or vomiting  noted at this time. 07/02/17; still taking doxycycline as prescribed by Dr. Ola Spurr. Hydrofera Blue continues. We have no palpable bone. Still have not had had approval of Apligraf 08/06/16; the patient arrives today with [facility] even though we apparently had changed to Sorbact last time. Her co-pay for Regranex is $389 in discussion with the patient and her husband this was excessive. We'll continue with the Sorbact and standard dressings for now 08/20/17; we have been using sorbact. The wound bed still requires debridement. Wound measurements are slightly better. 09/03/17;using Sorbact.no debridement today. Wound measurements slightly better. There is some discoloration of the tip of her toelooks like bruising 09/17/17; using Sorbact. No debridement today. Wound dimensions are about the same. Still some discoloration at the tip of her great toe. This does not look any worse than last time 10/01/17; using sorbact right great toe I thought she might have surface epithelialization last time although it is certainly not Eunice, Shantana V. ([id number]) look like that today nevertheless her dimensions are better. Continued surface eschar on the tip of her toe but this is not progressing. She is actually complaining of the left great toe 10/15/17; this is a very difficult area on the dorsal aspect of her proximal right great toe. At one point this wound had exposed bone however we have managed to get some degree of epithelialization. She was revascularized by Dr. Vallarie Mare in [place]. She saw Dr. Ola Spurr for underlying osteomyelitis. She is not a candidate for advanced treatment options [insurance issues]. She comes in today with a new area on the tip of her right  great toe. The exact history here is unclear. We have been using sorbact 10/29/17; patient arrives today with very significant bilateral increase edema which appears to extend into the thighs. This is tight and not particularly pitting however it looks a lot more impressive than I remember. Not surprisingly the wounds on her right great toe have increased in size with weeping edema fluid as the edema extends into the dorsal foot itself. She also has a wound on the tip of her right great toe which is a new wound from 2 weeks ago. We have been using sorbact. 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 He is on Demadex presumably twice a day at 20 mg. I have asked her family to make her an appointment with her primary physicians at the [place] [facility] to go over this degree of edema. This is causing I think deterioration in the right great toe wound. She also has significant PAD and is status post right leg bypass graft. I think this was done by [facility]. I believe they also said that there was nothing further that could be done with regards to her vascular status. 11/12/17; patient is going to see her primary doctor this afternoon with regards to lower extremity edema. She has 2 open wounds on the right great toe the original wound on the dorsal proximal part and then a more necrotic looking area on the tip of her right great toe. We took some time today to review her vascular status. The patient had a relate below knee popliteal to peroneal artery bypass with reversed greater saphenous vein on 06/13/17; she also had endarterectomy of the midsegment of the peroneal artery. 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 I think at some point in time after that she was offered  an arteriogram on the right but she declined. I've used Silver collagen to her wounds recently. 11/26/17; patient has 2 open wounds on the right great toe tip as well as the dorsal aspect of her right great toe. She has known PAD. I've been trying to get her back to see vein and vascular in [place] [facility] [place] [facility] to see if there are options for endovascular surgery to help with perfusion although the patient and her husband may not want any more surgery. We've been using silver alginate because of maceration. She does not have an option for advanced treatment/skin substitutes 12/10/17; continued open wounds much the same as 2 weeks ago on the dorsal aspect of the right great toe on the right great toe tip. I've encouraged to follow-up with Dr. Bridgett Larsson of vein and vascular to explore the possibility that there may be correctable ischemia involved in nonhealing these areas. We made considerable progress on the dorsal right great toe however it is stalled recently. We do not have an advanced treatment option. Originally this had exposed bone however there is a granulated base. 12/24/17; patient went back to see vein and vascular in [place]. She was seen by Manus Gunning NP. ABIs on the right were noted to be 0.84 and TBI of the right and 0.62. On the left her ABI was 0.82 and her TBI of 0.75. Waveforms were monophasic at the posterior tibial and dorsalis pedis bilaterally. 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 Her husband reiterates today that she does not want an arteriogram therefore I'm not going to press the issue. We've been using silver collagen to the wounds on the tip of the right great toe and dorsal surface of the right great toe. 01/07/18; patient arrives today with the tip of her toe healed. An cerebral amount of slough over  the wound on the dorsal toe. As noted on 12/24/17 no options currently for revascularization the patient will not allow an arteriogram 01/21/18; typical of her right great toe remains healed. Still large amount of slough over the wound on the dorsal toe. No options for revascularization. We've been using Medihoney 02/04/18; no major change. Still a punched out area on the right great toe dorsal wound. Covered in a difficult necrotic debris. 02/18/18; no major change. Still a punched out area over the right great toe. They're using [employer] [facility]) Signed: 02/20/2018 7:56:50 AM By: Linton Ham MD Entered By: Linton Ham on 02/18/2018 12:38:49 [name], [name] ([id number]) -------------------------------------------------------------------------------- Physical Exam Details Patient Name: [name], [name] Date of Service: 02/18/2018 10:00 AM Medical Record Number: [id number] Patient Account Number: [id number] Date of Birth/Sex: 1940-10-07 (77 y.o. F) Treating RN: Montey Hora Primary Care Provider: Beverlyn Roux Other Clinician: Referring Provider: Beverlyn Roux Treating Provider/Extender: Tito Dine in Treatment: 31 Constitutional Sitting or standing Blood Pressure is within target range for patient.. Pulse regular and within target range for patient.[name] Respirations regular, non-labored and within target range.. Temperature is normal and within the target range for the patient.[name] appears in no distress. Notes wound exam; oThe original wound on the dorsal aspect of the right great toe again has a necrotic surface. Using a #3 curet this was debrided. Hemostasis with direct pressure. There is no open area on  the tip of her toe. Electronic Signature(s) Signed: 02/20/2018 7:56:50 AM By: Linton Ham MD Entered By: Linton Ham on 02/18/2018 12:40:01 [name], [name] [name]  ([id number]) -------------------------------------------------------------------------------- Physician Orders Details Patient Name: [name], [name] Date of Service: 02/18/2018 10:00 AM Medical Record Number: [id number] Patient Account Number: [id number] Date of Birth/Sex: 10-23-1940 (76 y.o. F) Treating RN: Roger Shelter Primary Care Provider: Beverlyn Roux Other Clinician: Referring Provider: Beverlyn Roux Treating Provider/Extender: Tito Dine in Treatment: 76 Verbal / Phone Orders: No Diagnosis Coding Wound Cleansing Wound #7 Right,Dorsal Metatarsal head first o Clean wound with Normal Saline. Anesthetic (add to Medication List) Wound #7 Right,Dorsal Metatarsal head first o Topical Lidocaine 4% cream applied to wound bed prior to debridement (In Clinic Only). Primary Wound Dressing Wound #7 Right,Dorsal Metatarsal head first o Medihoney gel Secondary Dressing Wound #7 Right,Dorsal Metatarsal head first o ABD and Kerlix/Conform Dressing Change Frequency Wound #7 Right,Dorsal Metatarsal head first o Change Dressing Monday, Wednesday, Friday Follow-up Appointments Wound #7 Right,Dorsal Metatarsal head first o Return Appointment in 2 weeks. Edema Control Wound #7 Right,Dorsal Metatarsal head first o Elevate legs to the level of the heart and pump ankles as often as possible Additional Orders / Instructions Wound #7 Right,Dorsal Metatarsal head first o Increase protein intake. o Activity as tolerated Home Health Wound #7 Right,Dorsal Metatarsal head first o Continue [facility] Health Visits - [facility] weekly, [facility] o [place] Visits - [facility] twice weekly, [facility]-Wednesday o Home Health Nurse may visit [facility] to address patientos wound care needs. o Home Health Nurse may visit [facility] to address patientos wound care needs. o FACE TO FACE ENCOUNTER: MEDICARE and MEDICAID PATIENTS: I certify that  this patient is under my care and that I had a face-to-face encounter that meets the physician face-to-face encounter requirements with this Neff, Lania V. ([id number]) patient on this date. The encounter with the patient was in whole or in part for the following MEDICAL CONDITION: (primary reason for [place]) MEDICAL NECESSITY: I certify, that based on my findings, NURSING services are a medically necessary home health service. HOME BOUND STATUS: I certify that my clinical findings support that this patient is homebound (i.e., Due to illness or injury, pt requires aid of supportive devices such as crutches, cane, wheelchairs, walkers, the use of special transportation or the assistance of another person to leave their place of residence. There is a normal inability to leave the home and doing so requires considerable and taxing effort. Other absences are for medical reasons / religious services and are infrequent or of short duration when for other reasons). o FACE TO FACE ENCOUNTER: MEDICARE and MEDICAID PATIENTS: I certify that this patient is under my care and that I had a face-to-face encounter that meets the physician face-to-face encounter requirements with this patient on this date. The encounter with the patient was in whole or in part for the following MEDICAL CONDITION: (primary reason for [place]) MEDICAL NECESSITY: I certify, that based on my findings, NURSING services are a medically necessary home health service. HOME BOUND STATUS: I certify that my clinical findings support that this patient is homebound (i.e., Due to illness or injury, pt requires aid of supportive devices such as crutches, cane, wheelchairs, walkers, the use of special transportation or the assistance of another person to leave their place of residence. There is a normal inability to leave the home and doing so requires considerable and taxing effort. Other absences are for medical reasons /  religious services and are infrequent or of short duration when for other reasons). o If current dressing causes regression in wound condition, may D/C ordered dressing product/s and apply Normal Saline Moist Dressing daily until next [place] / Other MD appointment. [place] of regression in wound condition at [phone]. o If current dressing causes regression in wound condition, may D/C ordered dressing product/s and apply Normal Saline Moist Dressing daily until next [place] / Other MD appointment. [place] of regression in wound condition at [phone]. o Please direct any NON-WOUND related issues/requests for orders to patient's Primary Care Physician o Please direct any NON-WOUND related issues/requests for orders to patient's Primary Care Physician Electronic Signature(s) Signed: 02/18/2018 4:58:57 PM By: Roger Shelter Signed: 02/20/2018 7:56:50 AM By: Linton Ham MD Entered By: Roger Shelter on 02/18/2018 10:45:37 [name], [name] ([id number]) -------------------------------------------------------------------------------- Problem List Details Patient Name: [name], [name] Date of Service: 02/18/2018 10:00 AM Medical Record Number: [id number] Patient Account Number: [id number] Date of Birth/Sex: [birth year] (77 y.o. 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 She was offered an arteriogram but additional surgical Making : refused it. 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 Date of Service: 02/18/2018 10:00 AM Medical Record Number: [id number] Patient Account Number: [id number] Date of Birth/Sex: July 31, 1941 (77 y.o. F) Treating RN: Montey Hora Primary Care Provider: Beverlyn Roux Other Clinician: Referring Provider: Beverlyn Roux Treating Provider/Extender: Tito Dine in Treatment: 61 Subjective History of Present Illness (HPI) 08/13/16: This is a 77 year old woman who came predominantly for review of 3 cm in diameter circular wound to the left anterior lateral leg. She was in the ER on 08/01/16 I reviewed their notes. There was apparently pus coming out of the wound at that time and the patient arrived requesting debridement which they don't do in  the emergency room. Nevertheless I can't see that they did any x-rays. There were no cultures done. She is a type II diabetic and I a note after the patient was in the clinic that she had a bypass graft from the popliteal to the tibial on the right on 02/28/16. She also had a right greater saphenous vein harvest on the  same date for arterial bypass. She is going to have vascular studies including ABIs T ABIs on the right on 08/28/16. The patient's surgery was on 02/28/16 by Dr. Vallarie Mare she had a right below the knee popliteal artery to peroneal artery bypass with reverse greater saphenous vein and an endarterectomy of the mid segment peroneal artery. Postoperatively she had a strong mild monophasic peroneal signal with a pink foot. It would appear that the patient is had some nonhealing in the surgical saphenous vein harvest site on the left leg. Surprisingly looking [facility] I cannot see much information about this at all. Dr. Lucious Groves notes from 05/29/16 show that the patient's wounds "are not healed" the right first metatarsal wound healed but then opened back up. 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 At that point the patient had a wound VAC to her right calf with regards to the wounds on her dorsal right toe would appear that these are felt to be arterial wounds. She has had surgery on the metatarsal phalangeal in 2015 I Dr. Doran Durand secondary to a right metatarsal phalangeal joint fracture. She is apparently had discoloration around this area since then. 08/28/16; patient arrives with her wounds in much the same condition. The linear vein harvest site and the circular wound below it which I think was a blister. She also has to probing holes in her right great toe and a necrotic eschar on the right second toe. Because of these being arterial wounds I reduced her compression from 3-2  layers this seems to of done satisfactorily she has not had any problems. I cannot see that she is actually had an x-ray ====== 11/06/16 the patient comes in for evaluation of her right lower extremity ulcers. She was here in January 2018 for 2 visits subsequently ended up in the hospital with pneumonia and then to rehabilitation. She has now been discharged from rehabilitation and is home. She has multiple ulcerations to her right lower extremity including the foot and toes. She does have home health in place and they have been placing alginate to the ulcers. She is followed by Dr. Bridgett Larsson of vascular medicine. 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 She recently saw him on 3/23. In office ABIs were: Right 0.48 with monophasic flow to the DP, PT, peroneal Left 0.63 with monophasic flow to the DP and PT Her arterial studies indicated a patent right below knee popliteal to peroneal bypass She had an MRI in February 2008 that was negative frosty myelitis but this showed general soft tissue edema in the right foot and lower extremity concerning for cellulitis She is a diabetic, managed with insulin. Her hemoglobin A1c in December 2017 was 8.4 which is a trend up from previous levels. She had blood work in February 2018 which revealed an albumin of 2.6 this appears to be relatively acute as an albumin in November 2017 was 3.7 11/13/16; this is a patient I have not seen since February who is readmitted to our clinic last week. 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 Bridgett Larsson. I have reviewed Dr. Lianne Moris notes from March/23/18. Doppler ABI on that date showed an ABI on the right of 0.48 and on the left of 0.63. Dorsalis pedis waveforms were monophasic bilaterally.  There was no waveforms detected at the posterior tibial on the right, monophasic on the left. Dr. Lianne Moris  comments were that this patient would have follow-up vascular studies in 3 months including ABIs and right lower [name], Liany V. ([id number]) extremity arterial duplex. She had an MRI in February that was negative for osteomyelitis but showed generalized edema in the foot. Last albumin I see was in January at 3.4 we have been using Santyl to the 4 wounds on the right leg the patient is noted today to have widespread edema well up towards her groin this is pitting 2-3+. 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 Normal ejection fraction. 11/20/16; patient has a follow-up appointment with Dr. Bridgett Larsson on April 23. She is still complaining of a lot of pain in the right foot and right leg. It is not clear to me that this is at all positional however I think it is clear claudication with minimal activity perhaps at rest. At our suggestion she is return to her primary physician's office tomorrow with regards to her pitting lower extremity bilateral edema that I reviewed in detail last week 11/27/16; the follow-up with Dr. Bridgett Larsson was actually on May 23 on April 23 as I stated in my note last week. N/A case all of her wounds seems somewhat smaller. The 2 on the right leg are definitely smaller. The areas on the dorsal right first toe, right third toe and the lateral part of the right fifth metatarsal head all looks smaller but have tightly adherent surfaces. We have been using Santyl 12/04/16; follow-up with Dr. Bridgett Larsson on May 23. 2 small open wounds on the right leg continue to get smaller. The area on the right third total lateral aspect of the right fifth toe also look better. The remaining area on the dorsal first toe still has some depth to it. We have been using Santyl to the toes and collagen on the right 12/11/16; according to patient's husband the follow-up with Dr. Bridgett Larsson is not until July. All of the wounds on the right leg are measuring smaller. We have  been using some combination of Prisma and Santyl although I think we can go to straight Prisma today. There may have been some confusion with home health about the primary dressing orders here. 12/25/16; the patient has had some healing this week. The area on her right lateral fifth metatarsal head, right third toe are both healed lower right leg is healed. In the vein harvest site superiorly she has one superficial open area. On the dorsal aspect of her right great toe/MTP joint the wound is now divided into 2 however the proximal area is deep and there is palpable bone 01/01/17; still an open area in the middle of her original right surgical scar. The area on the right third toe and right lateral fifth metatarsal head remained closed. Problematic area on the dorsal aspect of the toe. Previous surgery in this area line 01/08/17 small open area on the original scar on her upper anterior leg although this is closing. X-ray I did of the right first toe did not show underlying bony abnormality. Still this area on the dorsal first toe probes to bone. We have been using Prisma 01/15/17; small open area in the original scar in the upper anterior leg is almost fully closed. She has 2 open areas over the dorsal aspect of the right first toe that probes to bone. Used and a form starting  last week. Vigorous bone scraping that I did last week showed few methicillin sensitive staph aureus. She is allergic to penicillin and sulfa drugs. I'm going to give her 2 weeks of doxycycline. She will need an MRI with contrast. 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 She continues on doxycycline for a bone scraping that showed methicillin sensitive staph aureus [allergic to penicillin and sulfa]. We have been using Endo form to the wound. 01/29/17; surprisingly her MRI did not show osteomyelitis. She continues on doxycycline for a bone scraping that  showed methicillin sensitive staph aureus with allergies to penicillin and sulfa. We have been using Endo form to the wound. Unfortunately I cannot get a surface on this visit looks like it is able to support a healing state. Proximally there is still exposed bone. There is no overt soft tissue tenderness. Her MRI did show a previous fracture was surgery to this area but no hardware 02/12/17 on evaluation today patient appears to be doing well in regard to her lower extremity wound. She does have some mild discomfort but this is minimal. There's no evidence of infection. Her left great toe nail has somewhat been lifted up and there is a little bit of slight bleeding underneath but this is still firmly attached. 02/19/17; she ran out of doxycycline 2 days ago. Nevertheless I would like to continue this for 2 weeks to make a full 6 weeks of therapy as the bone scraping that I did from the open area on the dorsal right first toe showed MRSA. This should complete antibiotic therapy. 02/26/17; the patient is completing her 6 weeks of oral doxycycline. Deep wound on the dorsal right toe. This still has some exposed bone proximally. She does have a reasonably granulated surface albeit thin over bone. I've been using Endo form have made application for an amniotic skin sub 03/05/17; the patient has completed 6 weeks of doxycycline. This is a deep wound on the dorsal right toe which has exposed bone proximally. She has granulation over most of this wound albeit a thin layer. I've been using Endo form. Still do not have approval for [employer] 03/13/17 on evaluation today patient's right foot wound appears to be doing better measurement wise compared to her last evaluation. She has been tolerating the dressing change without complication and does have a appointment with the dietary that has been made as far as referral is concerned there just waiting for her and transportation to contact them back for an actual  date. Nonetheless patient has been having less pain at this point. No fevers, chills, nausea, or vomiting noted at this time. 03/26/17; linear wound over the dorsal aspect of her right great toe. At one point this had exposed bone on the most superior aspect however I am pleased to see today that this appears to have a surface of granulation. Apparently product was applied for but denied by [facility]. We'll need to see what that was. The patient has known PAD status post revascularization. MRI did not show osteomyelitis which was done in June [name], Sage V. ([id number]) 04/02/17; continued improvement using Endoform. She was approved for [facility] but hasn't over $841 co-pay per application, this is beyond her means 04/09/17; continues on Endoform. 04/16/17; appears to be doing nicely continuing on Endoform 04/22/17; patient did not have her dressing changed all week because of the weather. Using Endoform. Base of the wound looks  healthy elbow there appears to events surrounding maceration perhaps because of the drainage was not changing the wound dressing 04/30/17 wound today continues to close and except for a small divot on the superior part of the wound. This area did not probe the bone but I found this a little concerning as this was the area with exposed bone before we be able to get this to granulate forward. As I remember things she is not a candidate for skin substitutes secondary to an outlandish co-pay. I asked her husband to look into the out of pocket max for medications if he has 1 [Regranex] or totally for skin substitutes 05/07/17; the patient arrives today with a wound roughly the same size although under careful inspection under the light there appears to be some epithelialization. Her intake nurse noted that the Endoform seemed to be placed over the wound rather than in the deeper divots they could really benefit from the Endoform. At this point I have no plans to change the Endoform as at  one point this was at least 33% exposed bone and the rest of the wound very close to the underlying phalanx 05/14/17; no major change in the size or appearance of the wound. We have been using Endoform for a prolonged period of time with reasonably good improvement in the epithelialization i.e. no exposed bone especially proximally. Unless we've not made a lot of changes in the last several weeks. She does not complain of pain. She was revascularized early this year or perhaps late last year by Dr. Bridgett Larsson and I wonder if he will need to have a look at her, I will need to review the actual vascular procedure which I think was a distal popliteal bypass 05/21/17; switched to either for a blue last week. Patient arrived in clinic with the wound not measuring any better but the surface looking some better. Unfortunately she had some surface slough and when I went to remove this superiorly she had exposed bone. Previously she had had exposed bone in the inferior part of the wound however this is granulated over some weeks ago. Clearly this is a major step back for her. With some difficulty I was able to obtain a piece of the bone probing through the superior part of the wound for culture. We did not send pathology. It is likely that she is going to need imaging of this site but I did not order this today 05/28/17; bone culture I took last week showed MSSA. She still has exposed bone however I could not get another piece for pathology. She had an MRI 4 months ago that did not show osteoarthritic at time. Wound rapidly deteriorated superiorly and I suspect this is underlying osteomyelitis. We have been using Hydrofera Blue 06/04/17 on evaluation today patient's wound appears to actually be doing a little bit better visually compared to last week's evaluation which is good news. Nonetheless she does have osteomyelitis of the toe which does not appear to be MRSA. No fevers, chills, nausea, or vomiting noted at  this time. She is having no discomfort. 06/11/17; patient's wound looks a little healthier than I remember seeing it 2 weeks ago. There is no exposed bone and the surface of the wound appears well granulated. We've been using hydrofera blue. I note that she was started on doxycycline which was MSSA. Her appointment with Dr. Ola Spurr is on November 12 I believe. 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 Vallarie Mare of vascular surgery on  11/9. He offered right leg angiography possible intervention however the patient refused. I've discussed this with the patient's husband today she did not want any further procedures. Also noteworthy that Dr. Vallarie Mare stated that this wound had not healed in 3 years, I was not aware of this degree of chronicity in this area. She has underlying osteomyelitis by bone culture. Culture of this grew MSSA, she is allergic to penicillin and therefore not a candidate for beta lactams. She saw Dr. Ola Spurr of infectious disease this morning, he continued her on doxycycline for another month and apparently sent in a prescription. We have been using Hydrofera Blue. ABI's update by Dr. Bridgett Larsson right 0.62, left 0.89. Monophasic wave forms 06/25/17 on evaluation today patient appears to be doing well in regard to her right great toe wound. She is still taking the doxycycline as prescribed by Dr. Ola Spurr. The Hydrofera Blue Dressing's to be doing as well as anything has for her up to this point and we are still waiting on an insurance authorization for the [place]. She is having no pain which is good news. No fevers, chills, nausea, or vomiting noted at this time. 07/02/17; still taking doxycycline as prescribed by Dr. Ola Spurr. Hydrofera Blue continues. We have no palpable bone. Still have not had had approval of Apligraf 08/06/16; the patient arrives today with [facility] even though we apparently had changed to Sorbact last time. Her co-pay for  Regranex is $389 in discussion with the patient and her husband this was excessive. We'll continue with the Sorbact and standard dressings for now 08/20/17; we have been using sorbact. The wound bed still requires debridement. Wound measurements are slightly better. 09/03/17;using Sorbact.no debridement today. Wound measurements slightly better. There is some discoloration of the tip of her toelooks like bruising 09/17/17; using Sorbact. No debridement today. Wound dimensions are about the same. Still some discoloration at the tip of her great toe. This does not look any worse than last time Rorrer, Carling V. ([id number]) 10/01/17; using sorbact right great toe I thought she might have surface epithelialization last time although it is certainly not look like that today nevertheless her dimensions are better. Continued surface eschar on the tip of her toe but this is not progressing. She is actually complaining of the left great toe 10/15/17; this is a very difficult area on the dorsal aspect of her proximal right great toe. At one point this wound had exposed bone however we have managed to get some degree of epithelialization. She was revascularized by Dr. Vallarie Mare in [place]. She saw Dr. Ola Spurr for underlying osteomyelitis. She is not a candidate for advanced treatment options [insurance issues]. She comes in today with a new area on the tip of her right great toe. The exact history here is unclear. We have been using sorbact 10/29/17; patient arrives today with very significant bilateral increase edema which appears to extend into the thighs. This is tight and not particularly pitting however it looks a lot more impressive than I remember. Not surprisingly the wounds on her right great toe have increased in size with weeping edema fluid as the edema extends into the dorsal foot itself. She also has a wound on the tip of her right great toe which is a new wound from 2 weeks ago. We have been using  sorbact. 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 He is on Demadex presumably twice a day at 20 mg. I  have asked her family to make her an appointment with her primary physicians at the [facility] clinic to go over this degree of edema. This is causing I think deterioration in the right great toe wound. She also has significant PAD and is status post right leg bypass graft. I think this was done by [facility]. I believe they also said that there was nothing further that could be done with regards to her vascular status. 11/12/17; patient is going to see her primary doctor this afternoon with regards to lower extremity edema. She has 2 open wounds on the right great toe the original wound on the dorsal proximal part and then a more necrotic looking area on the tip of her right great toe. We took some time today to review her vascular status. The patient had a relate below knee popliteal to peroneal artery bypass with reversed greater saphenous vein on 06/13/17; she also had endarterectomy of the midsegment of the peroneal artery. 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 I think at some point in time after that she was offered an arteriogram on the right but she declined. I've used Silver collagen to her wounds recently. 11/26/17; patient has 2 open wounds on the right great toe tip as well as the dorsal aspect of her right great toe. She has known PAD. I've been trying to get her back to see vein and vascular in [facility] to see if there are options for endovascular surgery to help with perfusion although the patient and her husband may not want any more surgery. We've been using silver alginate because of maceration. She does not have an option for advanced  treatment/skin substitutes 12/10/17; continued open wounds much the same as 2 weeks ago on the dorsal aspect of the right great toe on the right great toe tip. I've encouraged to follow-up with Dr. Bridgett Larsson of vein and vascular to explore the possibility that there may be correctable ischemia involved in nonhealing these areas. We made considerable progress on the dorsal right great toe however it is stalled recently. We do not have an advanced treatment option. Originally this had exposed bone however there is a granulated base. 12/24/17; patient went back to see vein and vascular in [place]. She was seen by Manus Gunning NP. ABIs on the right were noted to be 0.84 and TBI of the right and 0.62. On the left her ABI was 0.82 and her TBI of 0.75. Waveforms were monophasic at the posterior tibial and dorsalis pedis bilaterally. 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 Her husband reiterates today that she does not want an arteriogram therefore I'm not going to press the issue. We've been using silver collagen to the wounds on the tip of the right great toe and dorsal surface of the right great toe. 01/07/18; patient arrives today with the tip of her toe healed. An cerebral amount of slough over the wound on the dorsal toe. As noted on 12/24/17 no options currently for revascularization the patient will not allow an arteriogram 01/21/18; typical of her right great toe remains healed. Still large amount of slough over the wound on the dorsal toe. No options for revascularization. We've been using  Medihoney 02/04/18; no major change. Still a punched out area on the right great toe dorsal wound. Covered in a difficult necrotic debris. 02/18/18; no major change. Still a punched out area over the right great toe. They're using  [place], Tacori V. ([id number]) Objective Constitutional Sitting or standing Blood Pressure is within target range for patient.. Pulse regular and within target range for patient.[name] Respirations regular, non-labored and within target range.. Temperature is normal and within the target range for the patient.[name] appears in no distress. Vitals Time Taken: 10:10 AM, Height: 64 in, Weight: 200 lbs, BMI: 34.3, Temperature: 97.8 F, Pulse: 79 bpm, Respiratory Rate: 18 breaths/min, Blood Pressure: 117/63 mmHg. General Notes: wound exam; The original wound on the dorsal aspect of the right great toe again has a necrotic surface. Using a #3 curet this was debrided. Hemostasis with direct pressure. There is no open area on the tip of her toe. Integumentary (Hair, Skin) Wound #7 status is Open. Original cause of wound was Gradually Appeared. The wound is located on the Right,Dorsal Metatarsal head first. The wound measures 3cm length x 1.2cm width x 0.2cm depth; 2.827cm^2 area and 0.565cm^3 volume. There is no tunneling or undermining noted. There is a large amount of serous drainage noted. The wound margin is distinct with the outline attached to the wound base. There is small (1-33%) pink granulation within the wound bed. There is a large (67-100%) amount of necrotic tissue within the wound bed including Adherent Slough. Periwound temperature was noted as No Abnormality. The periwound has tenderness on palpation. 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 There was a Excisional Skin/Subcutaneous Tissue Debridement with a total area of 3.6 sq cm performed by Ricard Dillon, MD. With the following instrument (s): Curette to remove Viable and Non-Viable tissue/material. Material removed includes Subcutaneous Tissue, Slough, and Biofilm. No specimens were taken. A time out was conducted at 10:43, prior to the start of the procedure. A Minimum amount of bleeding was controlled with Pressure. The procedure was tolerated well with a pain level of 0 throughout and a pain level of 0 following the procedure. Patient s Level of Consciousness post procedure was recorded as Awake and Alert. Post Debridement Measurements: 3cm length x 1.2cm width x 0.2cm depth; 0.565cm^3 volume. Character of Wound/Ulcer Post Debridement is stable. Severity of Tissue Post Debridement is: Fat layer exposed. Post procedure Diagnosis Wound #7: Same as Pre-Procedure Blanchett, Jecenia V. ([id number]) Plan Wound Cleansing: Wound #7 Right,Dorsal Metatarsal head first: Clean wound with Normal Saline. Anesthetic (add to Medication List): Wound #7 Right,Dorsal Metatarsal head first: Topical Lidocaine 4% cream applied to wound bed prior to debridement (In Clinic Only). Primary Wound Dressing: Wound #7 Right,Dorsal Metatarsal head first: Medihoney gel Secondary Dressing: Wound #7 Right,Dorsal Metatarsal head first: ABD and Kerlix/Conform Dressing Change Frequency: Wound #7 Right,Dorsal Metatarsal head first: Change Dressing Monday, Wednesday, Friday Follow-up Appointments: Wound #7 Right,Dorsal Metatarsal head first: Return Appointment in 2 weeks. Edema Control: Wound #7 Right,Dorsal Metatarsal head first: Elevate legs to the level of  the heart and pump ankles as often as possible Additional Orders / Instructions: Wound #7 Right,Dorsal Metatarsal head first: Increase protein  intake. Activity as tolerated Home Health: Wound #7 Right,Dorsal Metatarsal head first: [place] [facility], [facility] Continue Home Health Visits - [facility] twice weekly, [facility] Home Health Nurse may visit [facility] to address patient s wound care needs. Home Health Nurse may visit [facility] to address patient s wound care needs. FACE TO FACE ENCOUNTER: MEDICARE and MEDICAID PATIENTS: I certify that this patient is under my care and that I had a face-to-face encounter that meets the physician face-to-face encounter requirements with this patient on this date. The encounter with the patient was in whole or in part for the following MEDICAL CONDITION: (primary reason for [place]) MEDICAL NECESSITY: I certify, that based on my findings, NURSING services are a medically necessary home health service. HOME BOUND STATUS: I certify that my clinical findings support that this patient is homebound (i.e., Due to illness or injury, pt requires aid of supportive devices such as crutches, cane, wheelchairs, walkers, the use of special transportation or the assistance of another person to leave their place of residence. There is a normal inability to leave the home and doing so requires considerable and taxing effort. Other absences are for medical reasons / religious services and are infrequent or of short duration when for other reasons). FACE TO FACE ENCOUNTER: MEDICARE and MEDICAID PATIENTS: I certify that this patient is under my care and that I had a face-to-face encounter that meets the physician face-to-face encounter requirements with this patient on this date. The encounter with the patient was in whole or in part for the following MEDICAL CONDITION: (primary reason for [place]) MEDICAL NECESSITY: I certify, that based on my findings, NURSING services are a medically necessary home health service. HOME BOUND STATUS: I  certify that my clinical findings support that this patient is homebound (i.e., Due to illness or injury, pt requires aid of supportive devices such as crutches, cane, wheelchairs, walkers, the use of special transportation or the assistance of another person to leave their place of residence. There is a normal inability to leave the home and doing so requires considerable and taxing effort. Other absences are for medical reasons / religious services and are infrequent or of short duration when for other reasons). If current dressing causes regression in wound condition, may D/C ordered dressing product/s and apply Normal Saline Moist Dressing daily until next [place] / Other MD appointment. [place] of regression in [place], Ronnetta V. ([id number]) wound condition at [phone]. If current dressing causes regression in wound condition, may D/C ordered dressing product/s and apply Normal Saline Moist Dressing daily until next [place] / Other MD appointment. [place] of regression in wound condition at [phone]. 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 We've been using Medihoney Interventions: continue Medihoney 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 of foot 11/06/2016 Status: No change Complications: she does not have an option for revascularization that she is elected to pursue. She was offered an arteriogram but refused it. Interventions: at this point no additional surgical options are  present #1 the patient is really not making any progress. Still requiring  debridement. I'll give vitamin medihone another 2 weeks after that'll probably still go back to Iodoflex although I don't think we've used this in the past. #2 the wound is stalled. When she first came here this went down to bone we've managed to correct this but certainly not getting much in the way of aggressive granulation #3 she has PAD but is not a revascularization candidate #4 not in easy candidate for an advanced treatment option even if the wound surface was readyo [employer]) Signed: 02/20/2018 7:56:50 AM By: Linton Ham MD Entered By: Linton Ham on 02/18/2018 12:41:24 Headrick, Jaiya [name] ([id number]) -------------------------------------------------------------------------------- SuperBill Details Patient Name: Littrell, Elon V. Date of Service: 02/18/2018 Medical Record Number: [id number] Patient Account Number: [id number] Date of Birth/Sex: 1941/07/23 (77 y.o. 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 E11.52 Type 2 diabetes mellitus with diabetic peripheral angiopathy with gangrene I70.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 of foot Facility Procedures CPT4 Code Description: [id number] 11042 - DEB SUBQ TISSUE 20 SQ CM/< ICD-10 Diagnosis Description L97.519 Non-pressure chronic ulcer of other part of right foot with unspe Modifier: cified severit Quantity: 1 y Physician Procedures CPT4 Code Description: [id number] 11042 - WC PHYS SUBQ TISS 20 SQ CM ICD-10 Diagnosis Description L97.519 Non-pressure chronic ulcer of other part of right foot  with unspec Modifier: ified severit Quantity: 1 y Engineer, maintenance) Signed: 02/20/2018 7:56:50 AM By: Linton Ham MD Entered By: Linton Ham on 02/18/2018 12:42:03

## 2018-02-21 NOTE — Progress Notes (Signed)
[name], INGERSON ([id number]) Visit Report for 02/18/2018 Arrival Information Details Patient Name: [name], [name] Date of Service: 02/18/2018 10:00 AM Medical Record Number: [id number] Patient Account Number: [id number] Date of Birth/Sex: Apr 12, 1941 (77 y.o. F) Treating RN: Secundino Ginger Primary Care Jaydeen Darley: Beverlyn Roux Other Clinician: Referring Timmy Cleverly: Beverlyn Roux Treating Kenyia Wambolt/Extender: Tito Dine in Treatment: 34 Visit Information History Since Last Visit Added or deleted any medications: No Patient Arrived: Wheel Chair Any new allergies or adverse reactions: No Arrival Time: 10:14 Had a fall or experienced change in No activities of daily living that may affect Accompanied By: family risk of falls: Transfer Assistance: Other Signs or symptoms of abuse/neglect since last visito No Patient Identification Verified: Yes Hospitalized since last visit: No Secondary Verification Process Completed: Yes Implantable device outside of the clinic excluding No Patient Requires Transmission-Based No cellular tissue based products placed in the center Precautions: since last visit: Patient Has Alerts: No Has Dressing in Place as Prescribed: Yes Pain Present Now: No Notes family transfer Electronic Signature(s) Signed: 02/18/2018 4:26:46 PM By: Secundino Ginger Entered By: Secundino Ginger on 02/18/2018 10:15:38 [name], [name] ([id number]) -------------------------------------------------------------------------------- Encounter Discharge Information Details Patient Name: Donathan, Averiana V. Date of Service: 02/18/2018 10:00 AM Medical Record Number: [id number] Patient Account Number: [id number] Date of Birth/Sex: 04-10-1941 (77 y.o. F) Treating RN: Montey Hora Primary Care Kyri Dai: Beverlyn Roux Other Clinician: Referring Jaidan Prevette: Beverlyn Roux Treating Laverne Hursey/Extender: Tito Dine in Treatment: 46 Encounter Discharge Information Items Discharge Condition:  Stable Ambulatory Status: Wheelchair Discharge Destination: Home Transportation: Private Auto Accompanied By: spouse Schedule Follow-up Appointment: Yes Clinical Summary of Care: Electronic Signature(s) Signed: 02/18/2018 12:51:21 PM By: Montey Hora Entered By: Montey Hora on 02/18/2018 12:51:21 [name] ([id number]) -------------------------------------------------------------------------------- Lower Extremity Assessment Details Patient Name: Gelpi, Chrisette V. Date of Service: 02/18/2018 10:00 AM Medical Record Number: [id number] Patient Account Number: [id number] Date of Birth/Sex: Jan 24, 1941 (77 y.o. F) Treating RN: Secundino Ginger Primary Care Kymir Coles: Beverlyn Roux Other Clinician: Referring Theodore Rahrig: Beverlyn Roux Treating Leina Babe/Extender: Tito Dine in Treatment: 67 Edema Assessment Assessed: [Left: No] [Right: No] [Left: Edema] [Right: :] Calf Left: Right: Point of Measurement: 35 cm From Medial Instep cm 46 cm Ankle Left: Right: Point of Measurement: 12 cm From Medial Instep cm 33 cm Vascular Assessment Claudication: Claudication Assessment [Right:None] Pulses: Dorsalis Pedis Palpable: [Right:Yes] Posterior Tibial Extremity colors, hair growth, and conditions: Extremity Color: [Right:Hyperpigmented] Hair Growth on Extremity: [Right:No] Temperature of Extremity: [Right:Warm] Capillary Refill: [Right:< 3 seconds] Toe Nail Assessment Left: Right: Thick: Yes Discolored: Yes Deformed: Yes Improper Length and Hygiene: Yes Electronic Signature(s) Signed: 02/18/2018 4:26:46 PM By: Secundino Ginger Entered By: Secundino Ginger on 02/18/2018 10:30:26 [name], [name] ([id number]) -------------------------------------------------------------------------------- Multi Wound Chart Details Patient Name: [name], [name] Date of Service: 02/18/2018 10:00 AM Medical Record Number: [id number] Patient Account Number: [id number] Date of Birth/Sex: Jul 16, 1941 (77 y.o.  F) Treating RN: Roger Shelter Primary Care Will Schier: Beverlyn Roux Other Clinician: Referring Sudeep Scheibel: Beverlyn Roux Treating Jivan Symanski/Extender: Tito Dine in Treatment: 34 Vital Signs Height(in): 64 Pulse(bpm): 58 Weight(lbs): 200 Blood Pressure(mmHg): 117/63 Body Mass Index(BMI): 34 Temperature(F): 97.8 Respiratory Rate 18 (breaths/min): Photos: [N/A:N/A] Wound Location: Right Metatarsal head first - N/A N/A Dorsal Wounding Event: Gradually Appeared N/A N/A Primary Etiology: Diabetic Wound/Ulcer of the N/A N/A Lower Extremity Comorbid History: Cataracts, Chronic sinus N/A N/A problems/congestion, Congestive Heart Failure, Hypertension, Type II Diabetes Date Acquired: 08/06/2013 N/A N/A Weeks of Treatment: 67 N/A N/A Wound Status: Open N/A  N/A Measurements L x W x D 3x1.2x0.2 N/A N/A (cm) Area (cm) : 2.827 N/A N/A Volume (cm) : 0.565 N/A N/A % Reduction in Area: -140.00% N/A N/A % Reduction in Volume: -60.10% N/A N/A Classification: Grade 2 N/A N/A Exudate Amount: Large N/A N/A Exudate Type: Serous N/A N/A Exudate Color: amber N/A N/A Wound Margin: Distinct, outline attached N/A N/A Granulation Amount: Small (1-33%) N/A N/A Granulation Quality: Pink N/A N/A Necrotic Amount: Large (67-100%) N/A N/A Exposed Structures: Fascia: No N/A N/A Fat Layer (Subcutaneous Tissue) Exposed: No Tendon: No Muscle: No [name], [name] ([id number]) Joint: No Bone: No Epithelialization: None N/A N/A Debridement: Debridement - Excisional N/A N/A Pre-procedure 10:43 N/A N/A Verification/Time Out Taken: Tissue Debrided: Subcutaneous, Slough N/A N/A Level: Skin/Subcutaneous Tissue N/A N/A Debridement Area (sq cm): 3.6 N/A N/A Instrument: Curette N/A N/A Bleeding: Minimum N/A N/A Hemostasis Achieved: Pressure N/A N/A Procedural Pain: 0 N/A N/A Post Procedural Pain: 0 N/A N/A Debridement Treatment Procedure was tolerated well N/A N/A Response: Post  Debridement 3x1.2x0.2 N/A N/A Measurements L x W x D (cm) Post Debridement Volume: 0.565 N/A N/A (cm) Periwound Skin Texture: No Abnormalities Noted N/A N/A Periwound Skin Moisture: No Abnormalities Noted N/A N/A Periwound Skin Color: No Abnormalities Noted N/A N/A Temperature: No Abnormality N/A N/A Tenderness on Palpation: Yes N/A N/A Wound Preparation: Ulcer Cleansing: N/A N/A Rinsed/Irrigated with Saline Topical Anesthetic Applied: Other: lidocaine 4% Procedures Performed: Debridement N/A N/A Treatment Notes Electronic Signature(s) Signed: 02/20/2018 7:56:50 AM By: Linton Ham MD Entered By: Linton Ham on 02/18/2018 12:36:08 [name], [name] ([id number]) -------------------------------------------------------------------------------- Multi-Disciplinary Care Plan Details Patient Name: Doell, Amanie V. Date of Service: 02/18/2018 10:00 AM Medical Record Number: [id number] Patient Account Number: [id number] Date of Birth/Sex: 1941/02/14 (77 y.o. F) Treating RN: Roger Shelter Primary Care Kohan Azizi: Beverlyn Roux Other Clinician: Referring Kami Kube: Beverlyn Roux Treating Hinata Diener/Extender: Tito Dine in Treatment: 5 Active Inactive ` Abuse / Safety / Falls / Self Care Management Nursing Diagnoses: Impaired physical mobility Potential for falls Goals: Patient will remain injury free Date Initiated: 11/06/2016 Target Resolution Date: 12/30/2016 Goal Status: Active Patient/caregiver will verbalize understanding of skin care regimen Date Initiated: 11/06/2016 Target Resolution Date: 12/30/2016 Goal Status: Active Interventions: Assess fall risk on admission and as needed Treatment Activities: Patient referred to home care : 11/06/2016 Notes: ` Nutrition Nursing Diagnoses: Potential for alteratiion in Nutrition/Potential for imbalanced nutrition Goals: Patient/caregiver verbalizes understanding of need to maintain therapeutic glucose control per primary  care physician Date Initiated: 11/06/2016 Target Resolution Date: 12/30/2016 Goal Status: Active Interventions: Provide education on elevated blood sugars and impact on wound healing Notes: ` Orientation to the Wound Care Program Nursing Diagnoses: Knowledge deficit related to the wound healing center program Lindamood, LADEANA LAPLANT ([id number]) Goals: Patient/caregiver will verbalize understanding of the [place] Program Date Initiated: 11/06/2016 Target Resolution Date: 12/30/2016 Goal Status: Active Interventions: Provide education on orientation to the wound center Notes: ` Venous Leg Ulcer Nursing Diagnoses: Actual venous Insuffiency (use after diagnosis is confirmed) Knowledge deficit related to disease process and management Goals: Non-invasive venous studies are completed as ordered Date Initiated: 11/06/2016 Target Resolution Date: 12/30/2016 Goal Status: Active Patient/caregiver will verbalize understanding of disease process and disease management Date Initiated: 11/06/2016 Target Resolution Date: 12/30/2016 Goal Status: Active Interventions: Assess peripheral edema status every visit. Notes: ` Wound/Skin Impairment Nursing Diagnoses: Impaired tissue integrity Knowledge deficit related to smoking impact on wound healing Knowledge deficit related to ulceration/compromised skin integrity Goals: Ulcer/skin breakdown will heal  within 14 weeks Date Initiated: 11/06/2016 Target Resolution Date: 02/05/2017 Goal Status: Active Interventions: Assess ulceration(s) every visit Treatment Activities: Skin care regimen initiated : 11/06/2016 Notes: Electronic Signature(s) Signed: 02/18/2018 4:58:57 PM By: Denzil Hughes ([id number]) Entered By: Roger Shelter on 02/18/2018 10:42:41 [name], [name] ([id number]) -------------------------------------------------------------------------------- Pain Assessment Details Patient Name: [name], [name] Date of  Service: 02/18/2018 10:00 AM Medical Record Number: [id number] Patient Account Number: [id number] Date of Birth/Sex: [birth year] (77 y.o. F) Treating RN: Secundino Ginger Primary Care Diannie Willner: Beverlyn Roux Other Clinician: Referring Eh Sauseda: Beverlyn Roux Treating Ayesha Markwell/Extender: Tito Dine in Treatment: 107 Active Problems Location of Pain Severity and Description of Pain Patient Has Paino Yes Site Locations Rate the pain. Current Pain Level: 5 Worst Pain Level: 7 Least Pain Level: 4 Tolerable Pain Level: 4 Pain Management and Medication Current Pain Management: Goals for Pain Management Topical or injectable lidocaine is offered to patient for acute pain when surgical debridement is performed. If needed, Patient is instructed to use over the counter pain medication for the following 24-48 hours after debridement. Wound care MDs do not prescribed pain medications. Patient has chronic pain or uncontrolled pain. Patient has been instructed to make an appointment with their Primary Care Physician for pain management. Electronic Signature(s) Signed: 02/18/2018 4:26:46 PM By: Secundino Ginger Entered By: Secundino Ginger on 02/18/2018 10:17:34 [name], [name] ([id number]) -------------------------------------------------------------------------------- Patient/Caregiver Education Details Patient Name: [name], [name] Date of Service: 02/18/2018 10:00 AM Medical Record Number: [id number] Patient Account Number: [id number] Date of Birth/Gender: 1941/03/18 (77 y.o. F) Treating RN: Montey Hora Primary Care Physician: Beverlyn Roux Other Clinician: Referring Physician: Beverlyn Roux Treating Physician/Extender: Tito Dine in Treatment: 46 Education Assessment Education Provided To: Patient and Caregiver Education Topics Provided Wound/Skin Impairment: Handouts: Other: wound care as ordered Methods: Demonstration, Explain/Verbal Responses: State content correctly Electronic  Signature(s) Signed: 02/18/2018 5:29:35 PM By: Montey Hora Entered By: Montey Hora on 02/18/2018 12:51:53 [name], [name] ([id number]) -------------------------------------------------------------------------------- Wound Assessment Details Patient Name: [name], Vernona V. Date of Service: 02/18/2018 10:00 AM Medical Record Number: [id number] Patient Account Number: [id number] Date of Birth/Sex: 1941/06/07 (77 y.o. F) Treating RN: Secundino Ginger Primary Care Quintarius Ferns: Beverlyn Roux Other Clinician: Referring Ross Bender: Beverlyn Roux Treating Isiac Breighner/Extender: Tito Dine in Treatment: 67 Wound Status Wound Number: 7 Primary Diabetic Wound/Ulcer of the Lower Extremity Etiology: Wound Location: Right Metatarsal head first - Dorsal Wound Open Wounding Event: Gradually Appeared Status: Date Acquired: 08/06/2013 Comorbid Cataracts, Chronic sinus problems/congestion, Weeks Of Treatment: 67 History: Congestive Heart Failure, Hypertension, Type II Clustered Wound: No Diabetes Photos Photo Uploaded By: Secundino Ginger on 02/18/2018 10:35:11 Wound Measurements Length: (cm) 3 Width: (cm) 1.2 Depth: (cm) 0.2 Area: (cm) 2.827 Volume: (cm) 0.565 % Reduction in Area: -140% % Reduction in Volume: -60.1% Epithelialization: None Tunneling: No Undermining: No Wound Description Classification: Grade 2 Wound Margin: Distinct, outline attached Exudate Amount: Large Exudate Type: Serous Exudate Color: amber Foul Odor After Cleansing: No Slough/Fibrino Yes Wound Bed Granulation Amount: Small (1-33%) Exposed Structure Granulation Quality: Pink Fascia Exposed: No Necrotic Amount: Large (67-100%) Fat Layer (Subcutaneous Tissue) Exposed: No Necrotic Quality: Adherent Slough Tendon Exposed: No Muscle Exposed: No Joint Exposed: No Bone Exposed: No Periwound Skin Texture Texture Color No Abnormalities Noted: No No Abnormalities Noted: No Bogan, Gursimran V. ([id number]) Moisture  Temperature / Pain No Abnormalities Noted: No Temperature: No Abnormality Tenderness on Palpation: Yes Wound Preparation Ulcer Cleansing: Rinsed/Irrigated with Saline Topical Anesthetic Applied: Other: lidocaine 4%, Treatment Notes  Wound #7 (Right, Dorsal Metatarsal head first) 1. Cleansed with: Clean wound with Normal Saline 2. Anesthetic Topical Lidocaine 4% cream to wound bed prior to debridement 3. Peri-wound Care: Moisturizing lotion 4. Dressing Applied: Medihoney Gel 5. Secondary Dressing Applied ABD Pad Dry Gauze Kerlix/Conform Notes darko shoe, heel cup Electronic Signature(s) Signed: 02/18/2018 4:26:46 PM By: Secundino Ginger Entered By: Secundino Ginger on 02/18/2018 10:27:33 Watson, Hannelore V. ([id number]) -------------------------------------------------------------------------------- Vitals Details Patient Name: Mccraw, Joy V. Date of Service: 02/18/2018 10:00 AM Medical Record Number: [id number] Patient Account Number: [id number] Date of Birth/Sex: [birth year] (77 y.o. F) Treating RN: Secundino Ginger Primary Care Seiya Silsby: Beverlyn Roux Other Clinician: Referring Shani Fitch: Beverlyn Roux Treating Bentley Haralson/Extender: Tito Dine in Treatment: 40 Vital Signs Time Taken: 10:10 Temperature (F): 97.8 Height (in): 64 Pulse (bpm): 79 Weight (lbs): 200 Respiratory Rate (breaths/min): 18 Body Mass Index (BMI): 34.3 Blood Pressure (mmHg): 117/63 Reference Range: 80 - 120 mg / dl Electronic Signature(s) Signed: 02/18/2018 4:26:46 PM By: Secundino Ginger Entered BySecundino Ginger on 02/18/2018 10:18:07

## 2018-03-04 ENCOUNTER — Encounter: Payer: Medicare HMO | Admitting: Internal Medicine

## 2018-03-04 DIAGNOSIS — E1151 Type 2 diabetes mellitus with diabetic peripheral angiopathy without gangrene: Secondary | ICD-10-CM | POA: Diagnosis not present

## 2018-03-15 NOTE — Progress Notes (Signed)
ZAYDEN, HAHNE ([id number]) Visit Report for 03/04/2018 HPI Details Patient Name: [name], [name] Date of Service: 03/04/2018 10:00 AM Medical Record Number: [id number] Patient Account Number: [id number] Date of Birth/Sex: 03/07/1941 (77 y.o. F) Treating RN: Cornell Barman Primary Care Provider: Beverlyn Roux Other Clinician: Referring Provider: Beverlyn Roux Treating Provider/Extender: Tito Dine in Treatment: 72 History of Present Illness HPI Description: 08/13/16: This is a 77 year old woman who came predominantly for review of 3 cm in diameter circular wound to the left anterior lateral leg. She was in the ER on 08/01/16 I reviewed their notes. There was apparently pus coming out of the wound at that time and the patient arrived requesting debridement which they don't do in the emergency room. Nevertheless I can't see that they did any x-rays. There were no cultures done. She is a type II diabetic and I a note after the patient was in the clinic that she had a bypass graft from the popliteal to the tibial on the right on 02/28/16. She also had a right greater saphenous vein harvest on the same date for arterial bypass. She is going to have vascular studies including ABIs T ABIs on the right on 08/28/16. The patient's surgery was on 02/28/16 by Dr. Vallarie Mare she had a right below the knee popliteal artery to peroneal artery bypass with reverse greater saphenous vein and an endarterectomy of the mid segment peroneal artery. Postoperatively she had a strong mild monophasic peroneal signal with a pink foot. It would appear that the patient is had some nonhealing in the surgical saphenous vein harvest site on the left leg. Surprisingly looking through [facility] I cannot see much information about this at all. Dr. Lucious Groves notes from 05/29/16 show that the patient's wounds "are not healed" the right first metatarsal wound healed but then opened back up. 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 At that point the patient had a wound VAC to her right calf with regards to the wounds on her dorsal right toe would appear that these are felt to be arterial wounds. She has had surgery on the metatarsal phalangeal in 2015 I Dr. Doran Durand secondary to a right metatarsal phalangeal joint fracture. She is apparently had discoloration around this area since then. 08/28/16; patient arrives with her wounds in much the same condition. The linear vein harvest site and the circular wound below it which I think was a blister. She also has to probing holes in her right great toe and a necrotic eschar on the right second toe. Because of these being arterial wounds I reduced her compression from 3-2 layers this seems to of done satisfactorily she has not had any problems. I cannot see that she is actually had an x-ray ====== 11/06/16 the patient comes in for evaluation of her right lower extremity ulcers. She was here in January 2018 for 2 visits subsequently ended up in the hospital with pneumonia and then to rehabilitation. She has now been discharged from rehabilitation and is home. She has multiple ulcerations to her right lower extremity including the foot and toes. She does have home health in place and they have been placing alginate to the ulcers. She is followed by Dr. Bridgett Larsson of vascular medicine. 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 She recently saw him on 3/23. In office ABIs were:  Right 0.48 with monophasic flow to the DP, PT, peroneal Left 0.63 with monophasic flow to the DP and PT Her arterial studies indicated a patent right below knee popliteal to peroneal bypass She had an MRI in February 2008 that was negative frosty myelitis but this showed general soft tissue edema in the right foot and lower extremity concerning  for cellulitis She is a diabetic, managed with insulin. Her hemoglobin A1c in December 2017 was 8.4 which is a trend up from previous levels. She had blood work in February 2018 which revealed an albumin of 2.6 this appears to be relatively acute as an albumin in November 2017 was 3.7 11/13/16; this is a patient I have not seen since February who is readmitted to our clinic last week. 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 Bridgett Larsson. I have reviewed Dr. Lianne Moris notes from March/23/18. Doppler ABI on that date showed an ABI on the right of 0.48 and on the left of 0.63. Dorsalis pedis waveforms Winship, Alexis V. ([id number]) were monophasic bilaterally. There was no waveforms detected at the posterior tibial on the right, monophasic on the left. Dr. Lianne Moris comments were that this patient would have follow-up vascular studies in 3 months including ABIs and right lower extremity arterial duplex. She had an MRI in February that was negative for osteomyelitis but showed generalized edema in the foot. Last albumin I see was in January at 3.4 we have been using Santyl to the 4 wounds on the right leg the patient is noted today to have widespread edema well up towards her groin this is pitting 2-3+. 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 Normal ejection fraction. 11/20/16; patient has a follow-up appointment with Dr. Bridgett Larsson on April 23. She is still complaining of a lot of pain in the right foot and right leg. It is not clear to me that this is at all positional however I think it is clear claudication with minimal activity perhaps at rest. At our suggestion she is return to her primary physician's office tomorrow with regards to her pitting lower extremity bilateral edema that I reviewed in detail last week 11/27/16; the follow-up with Dr. Bridgett Larsson was actually on May 23 on April 23 as I  stated in my note last week. N/A case all of her wounds seems somewhat smaller. The 2 on the right leg are definitely smaller. The areas on the dorsal right first toe, right third toe and the lateral part of the right fifth metatarsal head all looks smaller but have tightly adherent surfaces. We have been using Santyl 12/04/16; follow-up with Dr. Bridgett Larsson on May 23. 2 small open wounds on the right leg continue to get smaller. The area on the right third total lateral aspect of the right fifth toe also look better. The remaining area on the dorsal first toe still has some depth to it. We have been using Santyl to the toes and collagen on the right 12/11/16; according to patient's husband the follow-up with Dr. Bridgett Larsson is not until July. All of the wounds on the right leg are measuring smaller. We have been using some combination of Prisma and Santyl although I think we can go to straight Prisma today. There may have been some confusion with home health about the primary dressing orders here. 12/25/16; the patient has had some healing this week. The area on her right  lateral fifth metatarsal head, right third toe are both healed lower right leg is healed. In the vein harvest site superiorly she has one superficial open area. On the dorsal aspect of her right great toe/MTP joint the wound is now divided into 2 however the proximal area is deep and there is palpable bone 01/01/17; still an open area in the middle of her original right surgical scar. The area on the right third toe and right lateral fifth metatarsal head remained closed. Problematic area on the dorsal aspect of the toe. Previous surgery in this area line 01/08/17 small open area on the original scar on her upper anterior leg although this is closing. X-ray I did of the right first toe did not show underlying bony abnormality. Still this area on the dorsal first toe probes to bone. We have been using Prisma 01/15/17; small open area in the original scar  in the upper anterior leg is almost fully closed. She has 2 open areas over the dorsal aspect of the right first toe that probes to bone. Used and a form starting last week. Vigorous bone scraping that I did last week showed few methicillin sensitive staph aureus. She is allergic to penicillin and sulfa drugs. I'm going to give her 2 weeks of doxycycline. She will need an MRI with contrast. 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 She continues on doxycycline for a bone scraping that showed methicillin sensitive staph aureus [allergic to penicillin and sulfa]. We have been using Endo form to the wound. 01/29/17; surprisingly her MRI did not show osteomyelitis. She continues on doxycycline for a bone scraping that showed methicillin sensitive staph aureus with allergies to penicillin and sulfa. We have been using Endo form to the wound. Unfortunately I cannot get a surface on this visit looks like it is able to support a healing state. Proximally there is still exposed bone. There is no overt soft tissue tenderness. Her MRI did show a previous fracture was surgery to this area but no hardware 02/12/17 on evaluation today patient appears to be doing well in regard to her lower extremity wound. She does have some mild discomfort but this is minimal. There's no evidence of infection. Her left great toe nail has somewhat been lifted up and there is a little bit of slight bleeding underneath but this is still firmly attached. 02/19/17; she ran out of doxycycline 2 days ago. Nevertheless I would like to continue this for 2 weeks to make a full 6 weeks of therapy as the bone scraping that I did from the open area on the dorsal right first toe showed MRSA. This should complete antibiotic therapy. 02/26/17; the patient is completing her 6 weeks of oral doxycycline. Deep wound on the dorsal right toe. This still has some exposed bone  proximally. She does have a reasonably granulated surface albeit thin over bone. I've been using Endo form have made application for an amniotic skin sub 03/05/17; the patient has completed 6 weeks of doxycycline. This is a deep wound on the dorsal right toe which has exposed bone proximally. She has granulation over most of this wound albeit a thin layer. I've been using Endo form. Still do not have approval for [employer] 03/13/17 on evaluation today patient's right foot wound appears to be doing better measurement wise compared to her last evaluation. She has been tolerating the dressing change without complication and  does have a appointment with the dietary that has been made as far as referral is concerned there just waiting for her and transportation to contact them back for an actual date. Nonetheless patient has been having less pain at this point. No fevers, chills, nausea, or vomiting noted at this time. 03/26/17; linear wound over the dorsal aspect of her right great toe. At one point this had exposed bone on the most superior aspect however I am pleased to see today that this appears to have a surface of granulation. Apparently product was applied Franek, Weslee V. ([id number]) for but denied by [facility]. We'll need to see what that was. The patient has known PAD status post revascularization. MRI did not show osteomyelitis which was done in June 04/02/17; continued improvement using Endoform. She was approved for [employer] but hasn't over $829 co-pay per application, this is beyond her means 04/09/17; continues on Endoform. 04/16/17; appears to be doing nicely continuing on Endoform 04/22/17; patient did not have her dressing changed all week because of the weather. Using Endoform. Base of the wound looks healthy elbow there appears to events surrounding maceration perhaps because of the drainage was not changing the wound dressing 04/30/17 wound today continues to close and except for a small divot on  the superior part of the wound. This area did not probe the bone but I found this a little concerning as this was the area with exposed bone before we be able to get this to granulate forward. As I remember things she is not a candidate for skin substitutes secondary to an outlandish co-pay. I asked her husband to look into the out of pocket max for medications if he has 1 [Regranex] or totally for skin substitutes 05/07/17; the patient arrives today with a wound roughly the same size although under careful inspection under the light there appears to be some epithelialization. Her intake nurse noted that the Endoform seemed to be placed over the wound rather than in the deeper divots they could really benefit from the Endoform. At this point I have no plans to change the Endoform as at one point this was at least 33% exposed bone and the rest of the wound very close to the underlying phalanx 05/14/17; no major change in the size or appearance of the wound. We have been using Endoform for a prolonged period of time with reasonably good improvement in the epithelialization i.e. no exposed bone especially proximally. Unless we've not made a lot of changes in the last several weeks. She does not complain of pain. She was revascularized early this year or perhaps late last year by Dr. Bridgett Larsson and I wonder if he will need to have a look at her, I will need to review the actual vascular procedure which I think was a distal popliteal bypass 05/21/17; switched to either for a blue last week. Patient arrived in clinic with the wound not measuring any better but the surface looking some better. Unfortunately she had some surface slough and when I went to remove this superiorly she had exposed bone. Previously she had had exposed bone in the inferior part of the wound however this is granulated over some weeks ago. Clearly this is a major step back for her. With some difficulty I was able to obtain a piece of the  bone probing through the superior part of the wound for culture. We did not send pathology. It is likely that she is going to need imaging of this site but  I did not order this today 05/28/17; bone culture I took last week showed MSSA. She still has exposed bone however I could not get another piece for pathology. She had an MRI 4 months ago that did not show osteoarthritic at time. Wound rapidly deteriorated superiorly and I suspect this is underlying osteomyelitis. We have been using Hydrofera Blue 06/04/17 on evaluation today patient's wound appears to actually be doing a little bit better visually compared to last week's evaluation which is good news. Nonetheless she does have osteomyelitis of the toe which does not appear to be MRSA. No fevers, chills, nausea, or vomiting noted at this time. She is having no discomfort. 06/11/17; patient's wound looks a little healthier than I remember seeing it 2 weeks ago. There is no exposed bone and the surface of the wound appears well granulated. We've been using hydrofera blue. I note that she was started on doxycycline which was MSSA. Her appointment with Dr. Ola Spurr is on November 12 I believe. 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 Vallarie Mare of vascular surgery on 11/9. He offered right leg angiography possible intervention however the patient refused. I've discussed this with the patient's husband today she did not want any further procedures. Also noteworthy that Dr. Vallarie Mare stated that this wound had not healed in 3 years, I was not aware of this degree of chronicity in this area. She has underlying osteomyelitis by bone culture. Culture of this grew MSSA, she is allergic to penicillin and therefore not a candidate for beta lactams. She saw Dr. Ola Spurr of infectious disease this morning, he continued her on doxycycline for another month and apparently sent in a prescription. We have been using Hydrofera Blue. ABI's  update by Dr. Bridgett Larsson right 0.62, left 0.89. Monophasic wave forms 06/25/17 on evaluation today patient appears to be doing well in regard to her right great toe wound. She is still taking the doxycycline as prescribed by Dr. Ola Spurr. The Hydrofera Blue Dressing's to be doing as well as anything has for her up to this point and we are still waiting on an insurance authorization for the [place]. She is having no pain which is good news. No fevers, chills, nausea, or vomiting noted at this time. 07/02/17; still taking doxycycline as prescribed by Dr. Ola Spurr. Hydrofera Blue continues. We have no palpable bone. Still have not had had approval of Apligraf 08/06/16; the patient arrives today with [facility] even though we apparently had changed to Sorbact last time. Her co-pay for Regranex is $389 in discussion with the patient and her husband this was excessive. We'll continue with the Sorbact and standard dressings for now 08/20/17; we have been using sorbact. The wound bed still requires debridement. Wound measurements are slightly better. 09/03/17;using Sorbact.no debridement today. Wound measurements slightly better. There is some discoloration of the tip of her toelooks like bruising Edds, Kendyl V. ([id number]) 09/17/17; using Sorbact. No debridement today. Wound dimensions are about the same. Still some discoloration at the tip of her great toe. This does not look any worse than last time 10/01/17; using sorbact right great toe I thought she might have surface epithelialization last time although it is certainly not look like that today nevertheless her dimensions are better. Continued surface eschar on the tip of her toe but this is not progressing. She is actually complaining of the left great toe 10/15/17; this is a very difficult area on the dorsal aspect of her proximal right great toe. At one point this  wound had exposed bone however we have managed to get some degree of  epithelialization. She was revascularized by Dr. Vallarie Mare in [place]. She saw Dr. Ola Spurr for underlying osteomyelitis. She is not a candidate for advanced treatment options [insurance issues]. She comes in today with a new area on the tip of her right great toe. The exact history here is unclear. We have been using sorbact 10/29/17; patient arrives today with very significant bilateral increase edema which appears to extend into the thighs. This is tight and not particularly pitting however it looks a lot more impressive than I remember. Not surprisingly the wounds on her right great toe have increased in size with weeping edema fluid as the edema extends into the dorsal foot itself. She also has a wound on the tip of her right great toe which is a new wound from 2 weeks ago. We have been using sorbact. 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 He is on Demadex presumably twice a day at 20 mg. I have asked her family to make her an appointment with her primary physicians at the [facility] to go over this degree of edema. This is causing I think deterioration in the right great toe wound. She also has significant PAD and is status post right leg bypass graft. I think this was done by [facility]. I believe they also said that there was nothing further that could be done with regards to her vascular status. 11/12/17; patient is going to see her primary doctor this afternoon with regards to lower extremity edema. She has 2 open wounds on the right great toe the original wound on the dorsal proximal part and then a more necrotic looking area on the tip of her right great toe. We took some time today to review her vascular status. The patient had a relate below knee popliteal to peroneal artery bypass with reversed greater saphenous vein on 06/13/17; she also had endarterectomy of the midsegment of the  peroneal artery. 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 I think at some point in time after that she was offered an arteriogram on the right but she declined. I've used Silver collagen to her wounds recently. 11/26/17; patient has 2 open wounds on the right great toe tip as well as the dorsal aspect of her right great toe. She has known PAD. I've been trying to get her back to see vein and vascular in [facility] to see if there are options for endovascular surgery to help with perfusion although the patient and her husband may not want any more surgery. We've been using silver alginate because of maceration. She does not have an option for advanced treatment/skin substitutes 12/10/17; continued open wounds much the same as 2 weeks ago on the dorsal aspect of the right great toe on the right great toe tip. I've encouraged to follow-up with Dr. Bridgett Larsson of vein and vascular to explore the possibility that there may be correctable ischemia involved in nonhealing these areas. We made considerable progress on the dorsal right great toe however it is stalled recently. We do not have an advanced treatment option. Originally this had exposed bone however there is a granulated base. 12/24/17; patient went back to see vein and vascular in [place]. She was seen by Manus Gunning NP. ABIs  on the right were noted to be 0.84 and TBI of the right and 0.62. On the left her ABI was 0.82 and her TBI of 0.75. Waveforms were monophasic at the posterior tibial and dorsalis pedis bilaterally. 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 Her husband reiterates today that she does not want an arteriogram therefore I'm  not going to press the issue. We've been using silver collagen to the wounds on the tip of the right great toe and dorsal surface of the right great toe. 01/07/18; patient arrives today with the tip of her toe healed. An cerebral amount of slough over the wound on the dorsal toe. As noted on 12/24/17 no options currently for revascularization the patient will not allow an arteriogram 01/21/18; typical of her right great toe remains healed. Still large amount of slough over the wound on the dorsal toe. No options for revascularization. We've been using Medihoney 02/04/18; no major change. Still a punched out area on the right great toe dorsal wound. Covered in a difficult necrotic debris. 02/18/18; no major change. Still a punched out area over the right great toe. They're using [place] 03/04/18; no major change. The wound is stalled. Still a punched out area over the right dorsal great toe. We've been using medihone for about a month not much change in the wound dimensions or the characteristics of the wound surface. We've previously used Iodoflex, Hydrofera Blue, sorbact, and I think at one point [place]. She has PAD and does not have any revascularization options although a wound distally on the tip of the great toe did close over Beigel, SIGNA CHEEK ([id number]) Electronic Signature(s) Signed: 03/04/2018 6:17:43 PM By: Linton Ham MD Entered By: Linton Ham on 03/04/2018 16:49:31 Sikora, Loranda [name] ([id number]) -------------------------------------------------------------------------------- Physical Exam Details Patient Name: Pankowski, Teagan V. Date of Service: 03/04/2018 10:00 AM Medical Record Number: [id number] Patient Account Number: [id number] Date of Birth/Sex: 07/24/1941 (77 y.o. F) Treating RN: Cornell Barman Primary Care Provider: Beverlyn Roux Other Clinician: Referring Provider: Beverlyn Roux Treating Provider/Extender: Tito Dine in Treatment: 57 Constitutional Patient is  hypertensive.. Pulse regular and within target range for patient.[name] Respirations regular, non-labored and within target range.. Temperature is normal and within the target range for the patient.[name] appears in no distress. Respiratory Respiratory effort is easy and symmetric bilaterally. Rate is normal at rest and on room air.. Cardiovascular no palpable pedal pulses. Edema present in both extremities.. Integumentary (Hair, Skin) no primary skin issues. Psychiatric No evidence of depression, anxiety, or agitation. Calm, cooperative, and communicative. Appropriate interactions and affect.. Notes exam oThe original wound on the dorsal aspect of the right great toe is about the same. At least 50% covered and necrotic surface debris. I did not debridement this today. Previously debridement has not really resulted in any changes. There is no evidence of surrounding infection Electronic Signature(s) Signed: 03/04/2018 6:17:43 PM By: Linton Ham MD Entered By: Linton Ham on 03/04/2018 16:51:25 Oravec, Nikkole [name] ([id number]) -------------------------------------------------------------------------------- Physician Orders Details Patient Name: Mathieson, Moncerrath V. Date of Service: 03/04/2018 10:00 AM Medical Record Number: [id number] Patient Account Number: [id number] Date of Birth/Sex: 05-01-1941 (77 y.o. F) Treating RN: Cornell Barman Primary Care Provider: Beverlyn Roux Other Clinician: Referring Provider: Beverlyn Roux Treating Provider/Extender: Tito Dine in Treatment: 58 Verbal / Phone Orders: No Diagnosis Coding  Wound Cleansing Wound #7 Right,Dorsal Metatarsal head first o Clean wound with Normal Saline. Anesthetic (add to Medication List) Wound #7 Right,Dorsal Metatarsal head first o Topical Lidocaine 4% cream applied to wound bed prior to debridement (In Clinic Only). Primary Wound Dressing Wound #7 Right,Dorsal Metatarsal head first o Medihoney gel Secondary  Dressing Wound #7 Right,Dorsal Metatarsal head first o ABD and Kerlix/Conform Dressing Change Frequency Wound #7 Right,Dorsal Metatarsal head first o Change Dressing Monday, Wednesday, Friday Follow-up Appointments Wound #7 Right,Dorsal Metatarsal head first o Return Appointment in 3 weeks. Edema Control Wound #7 Right,Dorsal Metatarsal head first o Elevate legs to the level of the heart and pump ankles as often as possible Additional Orders / Instructions Wound #7 Right,Dorsal Metatarsal head first o Increase protein intake. o Activity as tolerated Home Health Wound #7 Right,Dorsal Metatarsal head first o Continue [facility] Health Visits - [facility] weekly, [facility] o [place] Visits - [facility] twice weekly, [facility]-Wednesday o Home Health Nurse may visit [facility] to address patientos wound care needs. o Home Health Nurse may visit [facility] to address patientos wound care needs. o FACE TO FACE ENCOUNTER: MEDICARE and MEDICAID PATIENTS: I certify that this patient is under my care and that I had a face-to-face encounter that meets the physician face-to-face encounter requirements with this Raimondi, Koa V. ([id number]) patient on this date. The encounter with the patient was in whole or in part for the following MEDICAL CONDITION: (primary reason for [place]) MEDICAL NECESSITY: I certify, that based on my findings, NURSING services are a medically necessary home health service. HOME BOUND STATUS: I certify that my clinical findings support that this patient is homebound (i.e., Due to illness or injury, pt requires aid of supportive devices such as crutches, cane, wheelchairs, walkers, the use of special transportation or the assistance of another person to leave their place of residence. There is a normal inability to leave the home and doing so requires considerable and taxing effort. Other absences are for medical reasons /  religious services and are infrequent or of short duration when for other reasons). o FACE TO FACE ENCOUNTER: MEDICARE and MEDICAID PATIENTS: I certify that this patient is under my care and that I had a face-to-face encounter that meets the physician face-to-face encounter requirements with this patient on this date. The encounter with the patient was in whole or in part for the following MEDICAL CONDITION: (primary reason for [place]) MEDICAL NECESSITY: I certify, that based on my findings, NURSING services are a medically necessary home health service. HOME BOUND STATUS: I certify that my clinical findings support that this patient is homebound (i.e., Due to illness or injury, pt requires aid of supportive devices such as crutches, cane, wheelchairs, walkers, the use of special transportation or the assistance of another person to leave their place of residence. There is a normal inability to leave the home and doing so requires considerable and taxing effort. Other absences are for medical reasons / religious services and are infrequent or of short duration when for other reasons). o If current dressing causes regression in wound condition, may D/C ordered dressing product/s and apply Normal Saline Moist Dressing daily until next [place] / Other MD appointment. [place] of regression in wound condition at [phone]. o If current dressing causes regression in wound condition, may D/C ordered dressing product/s and apply Normal Saline Moist Dressing daily until next [place] / Other MD appointment. Notify [facility] of regression in wound condition at [phone]. o Please direct any NON-WOUND related issues/requests for orders to patient's Primary Care Physician o Please direct any NON-WOUND related issues/requests for orders to patient's Primary Care Physician Electronic Signature(s) Signed: 03/04/2018 6:17:43 PM By:  Linton Ham MD Signed: 03/06/2018 6:17:48 PM By: Gretta Cool, BSN, RN, CWS, Kim RN, BSN Entered By: Gretta Cool, BSN, RN, CWS, Kim on 03/04/2018 11:00:31 Wilmeth, [name] ([id number]) -------------------------------------------------------------------------------- Problem List Details Patient Name: Hutmacher, Shene V. Date of Service: 03/04/2018 10:00 AM Medical Record Number: [id number] Patient Account Number: [id number] Date of Birth/Sex: 04/02/1941 (77 y.o. 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 She was offered an arteriogram but additional surgical Making : refused it. 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 ([id number]) -------------------------------------------------------------------------------- Progress Note Details Patient Name: Brem, Kema V. Date of Service: 03/04/2018 10:00 AM Medical Record Number: [id number] Patient Account Number: [id number] Date of Birth/Sex: 1941/04/25 (77 y.o. F) Treating RN: Cornell Barman Primary Care Provider: Beverlyn Roux Other Clinician: Referring Provider: Beverlyn Roux Treating Provider/Extender: Tito Dine in Treatment: 91 Subjective History of Present Illness (HPI) 08/13/16: This is a 77 year old woman who came predominantly for review of 3 cm in diameter circular wound to the left anterior lateral leg. She was in the ER on 08/01/16 I reviewed their notes. There was apparently pus coming out of the wound at that time and the patient arrived requesting debridement which they don't do in the emergency room. Nevertheless I can't see that they did any x-rays. There were no cultures done. She is a type II diabetic and I a note after the patient was in the clinic that she had a bypass graft from the popliteal to the tibial on the right on 02/28/16. She also had a right greater saphenous vein harvest on the same date for arterial bypass. She is going to have vascular studies including ABIs T ABIs on the right on 08/28/16. The patient's surgery was on 02/28/16 by Dr. Vallarie Mare she had a right below the knee popliteal artery to peroneal artery bypass with reverse greater saphenous vein and an endarterectomy of the mid segment peroneal artery. Postoperatively she had a strong mild monophasic peroneal signal with a pink foot. It would appear that the patient is had  some nonhealing in the surgical saphenous vein harvest site on the left leg. Surprisingly looking [facility] I cannot see much information about this at all. Dr. Lucious Groves notes from 05/29/16 show that the patient's wounds "are not healed" the right first metatarsal wound healed but then opened back up. 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 At that point the patient had a wound VAC to her right calf with regards to the wounds on her dorsal right toe would appear that these are felt to be arterial wounds. She has had surgery on the metatarsal phalangeal in 2015 I Dr. Doran Durand secondary to a right metatarsal phalangeal joint fracture. She is apparently had discoloration around this area since then. 08/28/16; patient arrives with her wounds in much the same condition. The linear vein harvest site and the circular wound below it which I think was a blister. She also has to probing holes in her right great toe and a necrotic eschar on the right second toe. Because of these being arterial wounds I reduced her compression from 3-2 layers this seems to of done satisfactorily she has not had any problems. I cannot see that she is actually had an x-ray ====== 11/06/16 the patient comes in for evaluation of her right lower extremity ulcers. She was here in January 2018 for 2 visits subsequently ended up in the hospital with pneumonia and then to rehabilitation. She has now been discharged from rehabilitation and is home. She has multiple ulcerations to her right lower extremity including the foot and toes. She does have home health in place and they have been placing alginate to the ulcers. She is followed by Dr. Bridgett Larsson of vascular medicine. 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 She recently saw him on 3/23. In office ABIs were: Right 0.48 with monophasic flow to the DP, PT, peroneal Left 0.63 with monophasic flow to the DP and  PT Her arterial studies indicated a patent right below knee popliteal to peroneal bypass She had an MRI in February 2008 that was negative frosty myelitis but this showed general soft tissue edema in the right foot and lower extremity concerning for cellulitis She is a diabetic, managed with insulin. Her hemoglobin A1c in December 2017 was 8.4 which is a trend up from previous levels. She had blood work in February 2018 which revealed an albumin of 2.6 this appears to be relatively acute as an albumin in November 2017 was 3.7 11/13/16; this is a patient I have not seen since February who is readmitted to our clinic last week. 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 Bridgett Larsson. I have reviewed Dr. Lianne Moris notes from March/23/18. Doppler ABI on that date showed an ABI on the right of 0.48 and on the left of 0.63. Dorsalis pedis waveforms were monophasic bilaterally. There was no waveforms detected at the posterior tibial on the right, monophasic on the left. Dr. Lianne Moris comments were that this patient would have follow-up vascular studies in 3 months including ABIs and right lower Belkin, Gizzelle V. ([id number]) extremity arterial duplex. She had an MRI in February that was negative for osteomyelitis but showed generalized edema in the foot. Last albumin I see was in January at 3.4 we have been using Santyl to the  4 wounds on the right leg the patient is noted today to have widespread edema well up towards her groin this is pitting 2-3+. 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 Normal ejection fraction. 11/20/16; patient has a follow-up appointment with Dr. Bridgett Larsson on April 23. She is still complaining of a lot of pain in the right foot and right leg. It is not clear to me that this is at all positional however I think it is clear claudication with minimal activity perhaps at rest. At  our suggestion she is return to her primary physician's office tomorrow with regards to her pitting lower extremity bilateral edema that I reviewed in detail last week 11/27/16; the follow-up with Dr. Bridgett Larsson was actually on May 23 on April 23 as I stated in my note last week. N/A case all of her wounds seems somewhat smaller. The 2 on the right leg are definitely smaller. The areas on the dorsal right first toe, right third toe and the lateral part of the right fifth metatarsal head all looks smaller but have tightly adherent surfaces. We have been using Santyl 12/04/16; follow-up with Dr. Bridgett Larsson on May 23. 2 small open wounds on the right leg continue to get smaller. The area on the right third total lateral aspect of the right fifth toe also look better. The remaining area on the dorsal first toe still has some depth to it. We have been using Santyl to the toes and collagen on the right 12/11/16; according to patient's husband the follow-up with Dr. Bridgett Larsson is not until July. All of the wounds on the right leg are measuring smaller. We have been using some combination of Prisma and Santyl although I think we can go to straight Prisma today. There may have been some confusion with home health about the primary dressing orders here. 12/25/16; the patient has had some healing this week. The area on her right lateral fifth metatarsal head, right third toe are both healed lower right leg is healed. In the vein harvest site superiorly she has one superficial open area. On the dorsal aspect of her right great toe/MTP joint the wound is now divided into 2 however the proximal area is deep and there is palpable bone 01/01/17; still an open area in the middle of her original right surgical scar. The area on the right third toe and right lateral fifth metatarsal head remained closed. Problematic area on the dorsal aspect of the toe. Previous surgery in this area line 01/08/17 small open area on the original scar on her  upper anterior leg although this is closing. X-ray I did of the right first toe did not show underlying bony abnormality. Still this area on the dorsal first toe probes to bone. We have been using Prisma 01/15/17; small open area in the original scar in the upper anterior leg is almost fully closed. She has 2 open areas over the dorsal aspect of the right first toe that probes to bone. Used and a form starting last week. Vigorous bone scraping that I did last week showed few methicillin sensitive staph aureus. She is allergic to penicillin and sulfa drugs. I'm going to give her 2 weeks of doxycycline. She will need an MRI with contrast. 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 She continues on doxycycline for a bone scraping that showed methicillin sensitive staph  aureus [allergic to penicillin and sulfa]. We have been using Endo form to the wound. 01/29/17; surprisingly her MRI did not show osteomyelitis. She continues on doxycycline for a bone scraping that showed methicillin sensitive staph aureus with allergies to penicillin and sulfa. We have been using Endo form to the wound. Unfortunately I cannot get a surface on this visit looks like it is able to support a healing state. Proximally there is still exposed bone. There is no overt soft tissue tenderness. Her MRI did show a previous fracture was surgery to this area but no hardware 02/12/17 on evaluation today patient appears to be doing well in regard to her lower extremity wound. She does have some mild discomfort but this is minimal. There's no evidence of infection. Her left great toe nail has somewhat been lifted up and there is a little bit of slight bleeding underneath but this is still firmly attached. 02/19/17; she ran out of doxycycline 2 days ago. Nevertheless I would like to continue this for 2 weeks to make a full 6 weeks of therapy as the bone scraping  that I did from the open area on the dorsal right first toe showed MRSA. This should complete antibiotic therapy. 02/26/17; the patient is completing her 6 weeks of oral doxycycline. Deep wound on the dorsal right toe. This still has some exposed bone proximally. She does have a reasonably granulated surface albeit thin over bone. I've been using Endo form have made application for an amniotic skin sub 03/05/17; the patient has completed 6 weeks of doxycycline. This is a deep wound on the dorsal right toe which has exposed bone proximally. She has granulation over most of this wound albeit a thin layer. I've been using Endo form. Still do not have approval for [employer] 03/13/17 on evaluation today patient's right foot wound appears to be doing better measurement wise compared to her last evaluation. She has been tolerating the dressing change without complication and does have a appointment with the dietary that has been made as far as referral is concerned there just waiting for her and transportation to contact them back for an actual date. Nonetheless patient has been having less pain at this point. No fevers, chills, nausea, or vomiting noted at this time. 03/26/17; linear wound over the dorsal aspect of her right great toe. At one point this had exposed bone on the most superior aspect however I am pleased to see today that this appears to have a surface of granulation. Apparently product was applied for but denied by [facility] We'll need to see what that was. The patient has known PAD status post revascularization. MRI did not show osteomyelitis which was done in June Gombert, Antonio V. ([id number]) 04/02/17; continued improvement using Endoform. She was approved for [employer] but hasn't over $998 co-pay per application, this is beyond her means 04/09/17; continues on Endoform. 04/16/17; appears to be doing nicely continuing on Endoform 04/22/17; patient did not have her dressing changed all week because of  the weather. Using Endoform. Base of the wound looks healthy elbow there appears to events surrounding maceration perhaps because of the drainage was not changing the wound dressing 04/30/17 wound today continues to close and except for a small divot on the superior part of the wound. This area did not probe the bone but I found this a little concerning as this was the area with exposed bone before we be able to get this to granulate forward. As I remember things she is not  a candidate for skin substitutes secondary to an outlandish co-pay. I asked her husband to look into the out of pocket max for medications if he has 1 [Regranex] or totally for skin substitutes 05/07/17; the patient arrives today with a wound roughly the same size although under careful inspection under the light there appears to be some epithelialization. Her intake nurse noted that the Endoform seemed to be placed over the wound rather than in the deeper divots they could really benefit from the Endoform. At this point I have no plans to change the Endoform as at one point this was at least 33% exposed bone and the rest of the wound very close to the underlying phalanx 05/14/17; no major change in the size or appearance of the wound. We have been using Endoform for a prolonged period of time with reasonably good improvement in the epithelialization i.e. no exposed bone especially proximally. Unless we've not made a lot of changes in the last several weeks. She does not complain of pain. She was revascularized early this year or perhaps late last year by Dr. Bridgett Larsson and I wonder if he will need to have a look at her, I will need to review the actual vascular procedure which I think was a distal popliteal bypass 05/21/17; switched to either for a blue last week. Patient arrived in clinic with the wound not measuring any better but the surface looking some better. Unfortunately she had some surface slough and when I went to remove this  superiorly she had exposed bone. Previously she had had exposed bone in the inferior part of the wound however this is granulated over some weeks ago. Clearly this is a major step back for her. With some difficulty I was able to obtain a piece of the bone probing through the superior part of the wound for culture. We did not send pathology. It is likely that she is going to need imaging of this site but I did not order this today 05/28/17; bone culture I took last week showed MSSA. She still has exposed bone however I could not get another piece for pathology. She had an MRI 4 months ago that did not show osteoarthritic at time. Wound rapidly deteriorated superiorly and I suspect this is underlying osteomyelitis. We have been using Hydrofera Blue 06/04/17 on evaluation today patient's wound appears to actually be doing a little bit better visually compared to last week's evaluation which is good news. Nonetheless she does have osteomyelitis of the toe which does not appear to be MRSA. No fevers, chills, nausea, or vomiting noted at this time. She is having no discomfort. 06/11/17; patient's wound looks a little healthier than I remember seeing it 2 weeks ago. There is no exposed bone and the surface of the wound appears well granulated. We've been using hydrofera blue. I note that she was started on doxycycline which was MSSA. Her appointment with Dr. Ola Spurr is on November 12 I believe. 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 Vallarie Mare of vascular surgery on 11/9. He offered right leg angiography possible intervention however the patient refused. I've discussed this with the patient's husband today she did not want any further procedures. Also noteworthy that Dr. Vallarie Mare stated that this wound had not healed in 3 years, I was not aware of this degree of chronicity in this area. She has underlying osteomyelitis by bone culture. Culture of this grew MSSA, she is allergic to  penicillin and therefore not a candidate for  beta lactams. She saw Dr. Ola Spurr of infectious disease this morning, he continued her on doxycycline for another month and apparently sent in a prescription. We have been using Hydrofera Blue. ABI's update by Dr. Bridgett Larsson right 0.62, left 0.89. Monophasic wave forms 06/25/17 on evaluation today patient appears to be doing well in regard to her right great toe wound. She is still taking the doxycycline as prescribed by Dr. Ola Spurr. The Hydrofera Blue Dressing's to be doing as well as anything has for her up to this point and we are still waiting on an insurance authorization for the [place]. She is having no pain which is good news. No fevers, chills, nausea, or vomiting noted at this time. 07/02/17; still taking doxycycline as prescribed by Dr. Ola Spurr. Hydrofera Blue continues. We have no palpable bone. Still have not had had approval of Apligraf 08/06/16; the patient arrives today with [facility] even though we apparently had changed to Sorbact last time. Her co-pay for Regranex is $389 in discussion with the patient and her husband this was excessive. We'll continue with the Sorbact and standard dressings for now 08/20/17; we have been using sorbact. The wound bed still requires debridement. Wound measurements are slightly better. 09/03/17;using Sorbact.no debridement today. Wound measurements slightly better. There is some discoloration of the tip of her toelooks like bruising 09/17/17; using Sorbact. No debridement today. Wound dimensions are about the same. Still some discoloration at the tip of her great toe. This does not look any worse than last time Crandle, Estalene V. ([id number]) 10/01/17; using sorbact right great toe I thought she might have surface epithelialization last time although it is certainly not look like that today nevertheless her dimensions are better. Continued surface eschar on the tip of her toe but this is  not progressing. She is actually complaining of the left great toe 10/15/17; this is a very difficult area on the dorsal aspect of her proximal right great toe. At one point this wound had exposed bone however we have managed to get some degree of epithelialization. She was revascularized by Dr. Vallarie Mare in [place]. She saw Dr. Ola Spurr for underlying osteomyelitis. She is not a candidate for advanced treatment options [insurance issues]. She comes in today with a new area on the tip of her right great toe. The exact history here is unclear. We have been using sorbact 10/29/17; patient arrives today with very significant bilateral increase edema which appears to extend into the thighs. This is tight and not particularly pitting however it looks a lot more impressive than I remember. Not surprisingly the wounds on her right great toe have increased in size with weeping edema fluid as the edema extends into the dorsal foot itself. She also has a wound on the tip of her right great toe which is a new wound from 2 weeks ago. We have been using sorbact. 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 He is on Demadex presumably twice a day at 20 mg. I have asked her family to make her an appointment with her primary physicians at the [facility] to go over this degree of edema. This is causing I think deterioration in the right great toe wound. She also has significant PAD and is status post right leg bypass graft. I think this was done by [facility]. I believe they also said that there was nothing further that could be done with regards to her vascular  status. 11/12/17; patient is going to see her primary doctor this afternoon with regards to lower extremity edema. She has 2 open wounds on the right great toe the original wound on the dorsal proximal part and then a more necrotic looking area on the tip of  her right great toe. We took some time today to review her vascular status. The patient had a relate below knee popliteal to peroneal artery bypass with reversed greater saphenous vein on 06/13/17; she also had endarterectomy of the midsegment of the peroneal artery. 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 I think at some point in time after that she was offered an arteriogram on the right but she declined. I've used Silver collagen to her wounds recently. 11/26/17; patient has 2 open wounds on the right great toe tip as well as the dorsal aspect of her right great toe. She has known PAD. I've been trying to get her back to see vein and vascular in [facility] to see if there are options for endovascular surgery to help with perfusion although the patient and her husband may not want any more surgery. We've been using silver alginate because of maceration. She does not have an option for advanced treatment/skin substitutes 12/10/17; continued open wounds much the same as 2 weeks ago on the dorsal aspect of the right great toe on the right great toe tip. I've encouraged to follow-up with Dr. Bridgett Larsson of vein and vascular to explore the possibility that there may be correctable ischemia involved in nonhealing these areas. We made considerable progress on the dorsal right great toe however it is stalled recently. We do not have an advanced treatment option. Originally this had exposed bone however there is a granulated base. 12/24/17; patient went back to see vein and vascular in [place]. She was seen by Manus Gunning NP. ABIs on the right were noted to be 0.84 and TBI of the right and 0.62. On the left her ABI was 0.82 and her TBI of 0.75. Waveforms were monophasic at the posterior tibial and dorsalis pedis bilaterally. From the town of the note she was going to be  considered for an arteriogram although the patient does not want an arteriogram out of concern for her periprocedural stroke that she had previously during an attempted this. Her husband reiterates today that she does not want an arteriogram therefore I'm not going to press the issue. We've been using silver collagen to the wounds on the tip of the right great toe and dorsal surface of the right great toe. 01/07/18; patient arrives today with the tip of her toe healed. An cerebral amount of slough over the wound on the dorsal toe. As noted on 12/24/17 no options currently for revascularization the patient will not allow an arteriogram 01/21/18; typical of her right great toe remains healed. Still large amount of slough over the wound on the dorsal toe. No options for revascularization. We've been using Medihoney 02/04/18; no major change. Still a punched out area on the right great toe dorsal wound. Covered in a difficult necrotic debris. 02/18/18; no major change. Still a punched out area over the right great toe. They're using Senath 03/04/18; no major change. The wound is stalled. Still a punched out area over the right dorsal great toe. We've been using medihone for about a month not much change in the wound dimensions or the characteristics  of the wound surface. We've previously used Iodoflex, Hydrofera Blue, sorbact, and I think at one point [place]. She has PAD and does not have any revascularization options although a wound distally on the tip of the great toe did close over Kissler, Sophya V. ([id number]) Objective Constitutional Patient is hypertensive.. Pulse regular and within target range for patient.[name] Respirations regular, non-labored and within target range.. Temperature is normal and within the target range for the patient.[name] appears in no distress. Vitals Time Taken: 10:40 AM, Height: 64 in, Weight: 200 lbs, BMI: 34.3, Temperature: 97.7 F, Pulse: 75 bpm, Respiratory Rate: 18 breaths/min,  Blood Pressure: 158/56 mmHg. Respiratory Respiratory effort is easy and symmetric bilaterally. Rate is normal at rest and on room air.. Cardiovascular no palpable pedal pulses. Edema present in both extremities.[name] Psychiatric No evidence of depression, anxiety, or agitation. Calm, cooperative, and communicative. Appropriate interactions and affect.. General Notes: exam The original wound on the dorsal aspect of the right great toe is about the same. At least 50% covered and necrotic surface debris. I did not debridement this today. Previously debridement has not really resulted in any changes. There is no evidence of surrounding infection Integumentary (Hair, Skin) no primary skin issues. Wound #7 status is Open. Original cause of wound was Gradually Appeared. The wound is located on the Right,Dorsal Metatarsal head first. The wound measures 2.8cm length x 1cm width x 0.3cm depth; 2.199cm^2 area and 0.66cm^3 volume. There is no tunneling or undermining noted. There is a large amount of serous drainage noted. The wound margin is distinct with the outline attached to the wound base. There is small (1-33%) pink granulation within the wound bed. There is a large (67-100%) amount of necrotic tissue within the wound bed including Adherent Slough. The periwound skin appearance exhibited: Maceration. Periwound temperature was noted as No Abnormality. The periwound has tenderness on palpation. 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 ([id number]) Plan Wound Cleansing: Wound #7 Right,Dorsal Metatarsal head first: Clean wound with Normal Saline. Anesthetic (add to  Medication List): Wound #7 Right,Dorsal Metatarsal head first: Topical Lidocaine 4% cream applied to wound bed prior to debridement (In Clinic Only). Primary Wound Dressing: Wound #7 Right,Dorsal Metatarsal head first: Medihoney gel Secondary Dressing: Wound #7 Right,Dorsal Metatarsal head first: ABD and Kerlix/Conform Dressing Change Frequency: Wound #7 Right,Dorsal Metatarsal head first: Change Dressing Monday, Wednesday, Friday Follow-up Appointments: Wound #7 Right,Dorsal Metatarsal head first: Return Appointment in 3 weeks. Edema Control: Wound #7 Right,Dorsal Metatarsal head first: Elevate legs to the level of the heart and pump ankles as often as possible Additional Orders / Instructions: Wound #7 Right,Dorsal Metatarsal head first: Increase protein intake. Activity as tolerated Home Health: Wound #7 Right,Dorsal Metatarsal head first: [place] [facility], [facility] Continue Home Health Visits - [facility] weekly, [facility] Home Health Nurse may visit [facility] to address patient s wound care needs. Home Health Nurse may visit [facility] to address patient s wound care needs. FACE TO FACE ENCOUNTER: MEDICARE and MEDICAID PATIENTS: I certify that this patient is under my care and that I had a face-to-face encounter that meets the physician face-to-face encounter requirements with this patient on this date.  The encounter with the patient was in whole or in part for the following MEDICAL CONDITION: (primary reason for [place]) MEDICAL NECESSITY: I certify, that based on my findings, NURSING services are a medically necessary home health service. HOME BOUND STATUS: I certify that my clinical findings support that this patient is homebound (i.e., Due to illness or injury, pt requires aid of supportive devices such as crutches, cane, wheelchairs, walkers, the use of special transportation or the assistance of  another person to leave their place of residence. There is a normal inability to leave the home and doing so requires considerable and taxing effort. Other absences are for medical reasons / religious services and are infrequent or of short duration when for other reasons). FACE TO FACE ENCOUNTER: MEDICARE and MEDICAID PATIENTS: I certify that this patient is under my care and that I had a face-to-face encounter that meets the physician face-to-face encounter requirements with this patient on this date. The encounter with the patient was in whole or in part for the following MEDICAL CONDITION: (primary reason for [place]) MEDICAL NECESSITY: I certify, that based on my findings, NURSING services are a medically necessary home health service. HOME BOUND STATUS: I certify that my clinical findings support that this patient is homebound (i.e., Due to illness or injury, pt requires aid of supportive devices such as crutches, cane, wheelchairs, walkers, the use of special transportation or the assistance of another person to leave their place of residence. There is a normal inability to leave the home and doing so requires considerable and taxing effort. Other absences are for medical reasons / religious services and are infrequent or of short duration when for other reasons). If current dressing causes regression in wound condition, may D/C ordered dressing product/s and apply Normal Saline Moist Dressing daily until next [place] / Other MD appointment. [place] of regression in wound condition at [phone]. If current dressing causes regression in wound condition, may D/C ordered dressing product/s and apply Normal Saline Moist Dressing daily until next [place] / Other MD appointment. [place] of regression in wound condition at [phone]. 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 We've been using Medihoney Interventions: continue Medihoney 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 of foot 11/06/2016 Status: No change Complications: she does not have an option for revascularization that she is elected to pursue. She was offered an arteriogram but refused it. Interventions: at this point no additional surgical options are present #1 continue medihone gel for now #2 were going to consider doing Anasept gel if we can supply this. Electronic Signature(s) Signed: 03/04/2018 6:17:43 PM By: Linton Ham MD Entered By: Linton Ham on 03/04/2018 16:52:15 Weltman, Ashlie [name] ([id number]) -------------------------------------------------------------------------------- SuperBill Details Patient Name: Dibbern, Korayma V. Date of Service: 03/04/2018 Medical Record Number: [id number] Patient Account Number: [id number] Date of Birth/Sex: Nov 23, 1940 (77 y.o. F) Treating RN: Cornell Barman Primary Care Provider: Beverlyn Roux Other Clinician: Referring Provider: Beverlyn Roux Treating Provider/Extender: Tito Dine in Treatment: 69 Diagnosis Coding ICD-10 Codes Code Description E11.622 Type 2 diabetes mellitus with other skin ulcer E11.621 Type 2 diabetes mellitus with foot ulcer L97.519 Non-pressure chronic  ulcer of other part of right foot with unspecified severity E11.52 Type 2 diabetes mellitus with diabetic peripheral angiopathy with gangrene I70.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 of foot Facility Procedures CPT4 Code: [id number] Description: 99213 - WOUND CARE VISIT-LEV 3  EST PT Modifier: Quantity: 1 Physician Procedures CPT4 Code Description: [id number] 99213 - WC PHYS LEVEL 3 - EST PT ICD-10 Diagnosis Description L97.519 Non-pressure chronic ulcer of other part of right foot with unspe I70.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 o Modifier: cified severity f other part of fo Quantity: 1 ot Electronic Signature(s) Signed: 03/04/2018 6:17:43 PM By: Linton Ham MD Entered By: Linton Ham on 03/04/2018 16:52:59

## 2018-03-18 NOTE — Progress Notes (Signed)
[name], [name] ([id number]) Visit Report for 03/04/2018 Arrival Information Details Patient Name: [name], [name] Date of Service: 03/04/2018 10:00 AM Medical Record Number: [id number] Patient Account Number: [id number] Date of Birth/Sex: 07/05/1941 (77 y.o. F) Treating RN: Ahmed Prima Primary Care Khoa Opdahl: Beverlyn Roux Other Clinician: Referring Izel Eisenhardt: Beverlyn Roux Treating Kenetha Cozza/Extender: Tito Dine in Treatment: 89 Visit Information History Since Last Visit All ordered tests and consults were completed: No Patient Arrived: Wheel Chair Added or deleted any medications: No Arrival Time: 10:38 Any new allergies or adverse reactions: No Accompanied By: husband Had a fall or experienced change in No Transfer Assistance: EasyPivot Patient activities of daily living that may affect Lift risk of falls: Patient Identification Verified: Yes Signs or symptoms of abuse/neglect since last visito No Secondary Verification Process Yes Hospitalized since last visit: No Completed: Implantable device outside of the clinic excluding No Patient Requires Transmission-Based No cellular tissue based products placed in the center Precautions: since last visit: Patient Has Alerts: No Has Dressing in Place as Prescribed: Yes Pain Present Now: No Electronic Signature(s) Signed: 03/09/2018 4:59:33 PM By: Alric Quan Entered By: Alric Quan on 03/04/2018 10:39:23 [name], [name] ([id number]) -------------------------------------------------------------------------------- Clinic Level of Care Assessment Details Patient Name: White, [name] Date of Service: 03/04/2018 10:00 AM Medical Record Number: [id number] Patient Account Number: [id number] Date of Birth/Sex: 1940/09/26 (77 y.o. F) Treating RN: Cornell Barman Primary Care Arelyn Gauer: Beverlyn Roux Other Clinician: Referring Callan Yontz: Beverlyn Roux Treating Abisai Deer/Extender: Tito Dine in Treatment: 50 Clinic  Level of Care Assessment Items TOOL 4 Quantity Score []  - Use when only an EandM is performed on FOLLOW-UP visit 0 ASSESSMENTS - Nursing Assessment / Reassessment []  - Reassessment of Co-morbidities (includes updates in patient status) 0 X- 1 5 Reassessment of Adherence to Treatment Plan ASSESSMENTS - Wound and Skin Assessment / Reassessment X - Simple Wound Assessment / Reassessment - one wound 1 5 []  - 0 Complex Wound Assessment / Reassessment - multiple wounds []  - 0 Dermatologic / Skin Assessment (not related to wound area) ASSESSMENTS - Focused Assessment []  - Circumferential Edema Measurements - multi extremities 0 []  - 0 Nutritional Assessment / Counseling / Intervention []  - 0 Lower Extremity Assessment (monofilament, tuning fork, pulses) []  - 0 Peripheral Arterial Disease Assessment (using hand held doppler) ASSESSMENTS - Ostomy and/or Continence Assessment and Care []  - Incontinence Assessment and Management 0 []  - 0 Ostomy Care Assessment and Management (repouching, etc.) PROCESS - Coordination of Care X - Simple Patient / Family Education for ongoing care 1 15 []  - 0 Complex (extensive) Patient / Family Education for ongoing care X- 1 10 Staff obtains Programmer, systems, Records, Test Results / Process Orders []  - 0 Staff telephones HHA, Nursing Homes / Clarify orders / etc []  - 0 Routine Transfer to another Facility (non-emergent condition) []  - 0 Routine Hospital Admission (non-emergent condition) []  - 0 New Admissions / Biomedical engineer / Ordering NPWT, Apligraf, etc. []  - 0 Emergency Hospital Admission (emergent condition) X- 1 10 Simple Discharge Coordination [name], [name] ([id number]) []  - 0 Complex (extensive) Discharge Coordination PROCESS - Special Needs []  - Pediatric / Minor Patient Management 0 []  - 0 Isolation Patient Management []  - 0 Hearing / Language / Visual special needs []  - 0 Assessment of Community assistance (transportation, D/C  planning, etc.) []  - 0 Additional assistance / Altered mentation []  - 0 Support Surface(s) Assessment (bed, cushion, seat, etc.) INTERVENTIONS - Wound Cleansing / Measurement X - Simple Wound Cleansing -  one wound 1 5 []  - 0 Complex Wound Cleansing - multiple wounds X- 1 5 Wound Imaging (photographs - any number of wounds) []  - 0 Wound Tracing (instead of photographs) X- 1 5 Simple Wound Measurement - one wound []  - 0 Complex Wound Measurement - multiple wounds INTERVENTIONS - Wound Dressings []  - Small Wound Dressing one or multiple wounds 0 X- 1 15 Medium Wound Dressing one or multiple wounds []  - 0 Large Wound Dressing one or multiple wounds []  - 0 Application of Medications - topical []  - 0 Application of Medications - injection INTERVENTIONS - Miscellaneous []  - External ear exam 0 []  - 0 Specimen Collection (cultures, biopsies, blood, body fluids, etc.) []  - 0 Specimen(s) / Culture(s) sent or taken to Lab for analysis []  - 0 Patient Transfer (multiple staff / Civil Service fast streamer / Similar devices) []  - 0 Simple Staple / Suture removal (25 or less) []  - 0 Complex Staple / Suture removal (26 or more) []  - 0 Hypo / Hyperglycemic Management (close monitor of Blood Glucose) []  - 0 Ankle / Brachial Index (ABI) - do not check if billed separately X- 1 5 Vital Signs White, [name] ([id number]) Has the patient been seen at the hospital within the last three years: Yes Total Score: 80 Level Of Care: New/Established - Level 3 Electronic Signature(s) Signed: 03/06/2018 6:17:48 PM By: Gretta Cool, BSN, RN, CWS, Kim RN, BSN Entered By: Gretta Cool, BSN, RN, CWS, Kim on 03/04/2018 11:02:09 [name], [name] ([id number]) -------------------------------------------------------------------------------- Encounter Discharge Information Details Patient Name: White, [name] Date of Service: 03/04/2018 10:00 AM Medical Record Number: [id number] Patient Account Number: [id number] Date of Birth/Sex:  [birth year] (77 y.o. F) Treating RN: Montey Hora Primary Care Vianey Caniglia: Beverlyn Roux Other Clinician: Referring Sherlin Sonier: Beverlyn Roux Treating Braxson Hollingsworth/Extender: Tito Dine in Treatment: 88 Encounter Discharge Information Items Discharge Condition: Stable Ambulatory Status: Wheelchair Discharge Destination: Home Transportation: Private Auto Accompanied By: sister Schedule Follow-up Appointment: Yes Clinical Summary of Care: Electronic Signature(s) Signed: 03/04/2018 11:44:42 AM By: Montey Hora Entered By: Montey Hora on 03/04/2018 11:44:41 Wisman, Pattricia V. ([id number]) -------------------------------------------------------------------------------- Lower Extremity Assessment Details Patient Name: Macphail, Medora V. Date of Service: 03/04/2018 10:00 AM Medical Record Number: [id number] Patient Account Number: [id number] Date of Birth/Sex: 02-13-1941 (77 y.o. F) Treating RN: Ahmed Prima Primary Care Branko Steeves: Beverlyn Roux Other Clinician: Referring Cerys Winget: Beverlyn Roux Treating Trinita Devlin/Extender: Tito Dine in Treatment: 69 Edema Assessment Assessed: [Left: No] [Right: No] [Left: Edema] [Right: :] Calf Left: Right: Point of Measurement: 35 cm From Medial Instep cm 46 cm Ankle Left: Right: Point of Measurement: 12 cm From Medial Instep cm 33 cm Vascular Assessment Pulses: Dorsalis Pedis Palpable: [Right:Yes] Posterior Tibial Extremity colors, hair growth, and conditions: Extremity Color: [Right:Hyperpigmented] Temperature of Extremity: [Right:Warm] Capillary Refill: [Right:< 3 seconds] Toe Nail Assessment Left: Right: Thick: Yes Discolored: Yes Deformed: Yes Improper Length and Hygiene: Yes Electronic Signature(s) Signed: 03/09/2018 4:59:33 PM By: Alric Quan Entered By: Alric Quan on 03/04/2018 10:50:33 [name], [name] ([id number]) -------------------------------------------------------------------------------- Multi Wound  Chart Details Patient Name: [name], [name] Date of Service: 03/04/2018 10:00 AM Medical Record Number: [id number] Patient Account Number: [id number] Date of Birth/Sex: [birth year] (77 y.o. 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] Comorbid History: [7:Cataracts, Chronic sinus problems/congestion, Congestive Heart Failure, Hypertension, Type II Diabetes] [N/A:N/A] Date Acquired: [7:08/06/2013] [N/A:N/A] Weeks of Treatment: [7:69] [N/A:N/A] Wound Status: [7:Open] [N/A:N/A] Measurements L x W x D [7:2.8x1x0.3] [N/A:N/A] (cm) Area (cm) : [7:2.199] [N/A:N/A] Volume (cm) : [7:0.66] [N/A:N/A] % Reduction in Area: [7:-86.70%] [N/A:N/A] % Reduction in Volume: [7:-87.00%] [N/A:N/A] Classification: [7:Grade 2] [N/A:N/A] Exudate Amount: [7:Large] [N/A:N/A] Exudate Type: [7:Serous] [N/A:N/A] Exudate Color: [7:amber] [N/A:N/A] Wound Margin: [7:Distinct, outline attached] [N/A:N/A] Granulation Amount: [7:Small (1-33%)] [N/A:N/A] Granulation Quality: [7:Pink] [N/A:N/A] Necrotic Amount: [7:Large (67-100%)] [N/A:N/A] Exposed Structures: [7:Fascia: No Fat Layer (Subcutaneous Tissue) Exposed: No Tendon: No Muscle: No Joint: No Bone: No] [N/A:N/A] Epithelialization: [7:None] [N/A:N/A] Periwound Skin Texture: [7:No Abnormalities Noted] [N/A:N/A] Periwound Skin Moisture: [7:Maceration: Yes] [N/A:N/A] Periwound Skin Color: No Abnormalities Noted N/A N/A Temperature: No Abnormality  N/A N/A Tenderness on Palpation: Yes N/A N/A Wound Preparation: Ulcer Cleansing: N/A N/A Rinsed/Irrigated with Saline Topical Anesthetic Applied: Other: lidocaine 4% Treatment Notes Wound #7 (Right, Dorsal Metatarsal head first) 1. Cleansed with: Clean wound with Normal Saline 2. Anesthetic Topical Lidocaine 4% cream to wound bed prior to debridement 3. Peri-wound Care: Moisturizing lotion 4. Dressing Applied: Medihoney Gel 5. Secondary Dressing Applied ABD Pad Dry Gauze Kerlix/Conform 7. Secured with Tape Notes darko shoe, heel cup Electronic Signature(s) Signed: 03/04/2018 6:17:43 PM By: Linton Ham MD Entered By: Linton Ham on 03/04/2018 16:45:28 Jokerst, Progress ([id number]) -------------------------------------------------------------------------------- Multi-Disciplinary Care Plan Details Patient Name: [name], [name] Date of Service: 03/04/2018 10:00 AM Medical Record Number: [id number] Patient Account Number: [id number] Date of Birth/Sex: Mar 19, 1941 (77 y.o. F) Treating RN: Cornell Barman Primary Care Griselda Bramblett: Beverlyn Roux Other Clinician: Referring Roemello Speyer: Beverlyn Roux Treating Gennell How/Extender: Tito Dine in Treatment: 49 Active Inactive ` Abuse / Safety / Falls / Self Care Management Nursing Diagnoses: Impaired physical mobility Potential for falls Goals: Patient will remain injury free Date Initiated: 11/06/2016 Target Resolution Date: 12/30/2016 Goal Status: Active Patient/caregiver will verbalize understanding of skin care regimen Date Initiated: 11/06/2016 Target Resolution Date: 12/30/2016 Goal Status: Active Interventions: Assess fall risk on admission and as needed Treatment Activities: Patient referred to home care : 11/06/2016 Notes: ` Nutrition Nursing Diagnoses: Potential for alteratiion in Nutrition/Potential for imbalanced nutrition Goals: Patient/caregiver verbalizes understanding of need to maintain therapeutic glucose  control per primary care physician Date Initiated: 11/06/2016 Target Resolution Date: 12/30/2016 Goal Status: Active Interventions: Provide education on elevated blood sugars and impact on wound healing Notes: ` Orientation to the Wound Care Program Nursing Diagnoses: Knowledge deficit related to the wound healing center program HEAVENLEE, MAIORANA ([id number]) Goals: Patient/caregiver will verbalize understanding of the [place] Program Date Initiated: 11/06/2016 Target Resolution Date: 12/30/2016 Goal Status: Active Interventions: Provide education on orientation to the wound center Notes: ` Venous Leg Ulcer Nursing Diagnoses: Actual venous Insuffiency (use after diagnosis is confirmed) Knowledge deficit related to disease process and management Goals: Non-invasive venous studies are completed as ordered Date Initiated: 11/06/2016 Target Resolution Date: 12/30/2016 Goal Status: Active Patient/caregiver will verbalize understanding of disease process and disease management Date Initiated: 11/06/2016 Target Resolution Date: 12/30/2016 Goal Status: Active Interventions: Assess peripheral edema status every visit. Notes: ` Wound/Skin Impairment Nursing Diagnoses: Impaired tissue integrity Knowledge deficit related to smoking impact on wound healing Knowledge deficit related to ulceration/compromised skin integrity Goals: Ulcer/skin breakdown will heal within 14 weeks Date Initiated: 11/06/2016 Target Resolution Date:  02/05/2017 Goal Status: Active Interventions: Assess ulceration(s) every visit Treatment Activities: Skin care regimen initiated : 11/06/2016 Notes: Electronic Signature(s) Signed: 03/06/2018 6:17:48 PM By: Gretta Cool, BSN, RN, CWS, Kim RN, BSN Carnegie, Jaxie V. ([id number]) Entered By: Gretta Cool, BSN, RN, CWS, Kim on 03/04/2018 10:59:04 [name], [name] ([id number]) -------------------------------------------------------------------------------- Pain Assessment  Details Patient Name: [name], [name] Date of Service: 03/04/2018 10:00 AM Medical Record Number: [id number] Patient Account Number: [id number] Date of Birth/Sex: 01/20/1941 (77 y.o. F) Treating RN: Ahmed Prima Primary Care Ranada Vigorito: Beverlyn Roux Other Clinician: Referring Halsey Persaud: Beverlyn Roux Treating Izzabella Besse/Extender: Tito Dine in Treatment: 50 Active Problems Location of Pain Severity and Description of Pain Patient Has Paino No Site Locations Pain Management and Medication Current Pain Management: Electronic Signature(s) Signed: 03/09/2018 4:59:33 PM By: Alric Quan Entered By: Alric Quan on 03/04/2018 10:40:26 [name], [name] ([id number]) -------------------------------------------------------------------------------- Patient/Caregiver Education Details Patient Name: [name], [name] Date of Service: 03/04/2018 10:00 AM Medical Record Number: [id number] Patient Account Number: [id number] Date of Birth/Gender: 09/30/1940 (77 y.o. F) Treating RN: Montey Hora Primary Care Physician: Beverlyn Roux Other Clinician: Referring Physician: Beverlyn Roux Treating Physician/Extender: Tito Dine in Treatment: 61 Education Assessment Education Provided To: Patient and Caregiver Education Topics Provided Wound/Skin Impairment: Handouts: Other: foot movement to aid in circulation to wound Methods: Explain/Verbal Responses: State content correctly Electronic Signature(s) Signed: 03/04/2018 5:29:24 PM By: Montey Hora Entered By: Montey Hora on 03/04/2018 11:45:41 [name], Mckinzee V. ([id number]) -------------------------------------------------------------------------------- Wound Assessment Details Patient Name: Mcguinn, Allex V. Date of Service: 03/04/2018 10:00 AM Medical Record Number: [id number] Patient Account Number: [id number] Date of Birth/Sex: [birth year] (77 y.o. F) Treating RN: Ahmed Prima Primary Care Tanetta Fuhriman: Beverlyn Roux Other  Clinician: Referring Lakeidra Reliford: Beverlyn Roux Treating Edword Cu/Extender: Tito Dine in Treatment: 69 Wound Status Wound Number: 7 Primary Diabetic Wound/Ulcer of the Lower Extremity Etiology: Wound Location: Right Metatarsal head first - Dorsal Wound Open Wounding Event: Gradually Appeared Status: Date Acquired: 08/06/2013 Comorbid Cataracts, Chronic sinus problems/congestion, Weeks Of Treatment: 69 History: Congestive Heart Failure, Hypertension, Type II Clustered Wound: No Diabetes Photos Photo Uploaded By: Alric Quan on 03/05/2018 08:00:42 Wound Measurements Length: (cm) 2.8 Width: (cm) 1 Depth: (cm) 0.3 Area: (cm) 2.199 Volume: (cm) 0.66 % Reduction in Area: -86.7% % Reduction in Volume: -87% Epithelialization: None Tunneling: No Undermining: No Wound Description Classification: Grade 2 Wound Margin: Distinct, outline attached Exudate Amount: Large Exudate Type: Serous Exudate Color: amber Foul Odor After Cleansing: No Slough/Fibrino Yes Wound Bed Granulation Amount: Small (1-33%) Exposed Structure Granulation Quality: Pink Fascia Exposed: No Necrotic Amount: Large (67-100%) Fat Layer (Subcutaneous Tissue) Exposed: No Necrotic Quality: Adherent Slough Tendon Exposed: No Muscle Exposed: No Joint Exposed: No Bone Exposed: No Periwound Skin Texture Heminger, Rukaya V. ([id number]) Texture Color No Abnormalities Noted: No No Abnormalities Noted: No Moisture Temperature / Pain No Abnormalities Noted: No Temperature: No Abnormality Maceration: Yes Tenderness on Palpation: Yes Wound Preparation Ulcer Cleansing: Rinsed/Irrigated with Saline Topical Anesthetic Applied: Other: lidocaine 4%, Treatment Notes Wound #7 (Right, Dorsal Metatarsal head first) 1. Cleansed with: Clean wound with Normal Saline 2. Anesthetic Topical Lidocaine 4% cream to wound bed prior to debridement 3. Peri-wound Care: Moisturizing lotion 4. Dressing  Applied: Medihoney Gel 5. Secondary Dressing Applied ABD Pad Dry Gauze Kerlix/Conform 7. Secured with Tape Notes darko shoe, heel cup Electronic Signature(s) Signed: 03/09/2018 4:59:33 PM By: Alric Quan Entered By: Alric Quan on 03/04/2018 10:49:08 Hoogland, Keyira V. ([id number]) -------------------------------------------------------------------------------- Vitals Details Patient Name: Beale, Tyson V. Date of  Service: 03/04/2018 10:00 AM Medical Record Number: [id number] Patient Account Number: [id number] Date of Birth/Sex: [birth year] (77 y.o. F) Treating RN: Ahmed Prima Primary Care Karmel Patricelli: Beverlyn Roux Other Clinician: Referring Winda Summerall: Beverlyn Roux Treating Asael Pann/Extender: Tito Dine in Treatment: 32 Vital Signs Time Taken: 10:40 Temperature (F): 97.7 Height (in): 64 Pulse (bpm): 75 Weight (lbs): 200 Respiratory Rate (breaths/min): 18 Body Mass Index (BMI): 34.3 Blood Pressure (mmHg): 158/56 Reference Range: 80 - 120 mg / dl Electronic Signature(s) Signed: 03/09/2018 4:59:33 PM By: Alric Quan Entered By: Alric Quan on 03/04/2018 10:41:16

## 2018-03-25 ENCOUNTER — Encounter: Payer: Medicare HMO | Attending: Internal Medicine | Admitting: Internal Medicine

## 2018-03-25 DIAGNOSIS — E1152 Type 2 diabetes mellitus with diabetic peripheral angiopathy with gangrene: Secondary | ICD-10-CM | POA: Diagnosis not present

## 2018-03-25 DIAGNOSIS — E11621 Type 2 diabetes mellitus with foot ulcer: Secondary | ICD-10-CM | POA: Diagnosis not present

## 2018-03-25 DIAGNOSIS — I509 Heart failure, unspecified: Secondary | ICD-10-CM | POA: Diagnosis not present

## 2018-03-25 DIAGNOSIS — I110 Hypertensive heart disease with heart failure: Secondary | ICD-10-CM | POA: Insufficient documentation

## 2018-03-25 DIAGNOSIS — L97219 Non-pressure chronic ulcer of right calf with unspecified severity: Secondary | ICD-10-CM | POA: Insufficient documentation

## 2018-03-25 DIAGNOSIS — E11622 Type 2 diabetes mellitus with other skin ulcer: Secondary | ICD-10-CM | POA: Insufficient documentation

## 2018-03-25 DIAGNOSIS — I70232 Atherosclerosis of native arteries of right leg with ulceration of calf: Secondary | ICD-10-CM | POA: Diagnosis not present

## 2018-03-25 DIAGNOSIS — L97519 Non-pressure chronic ulcer of other part of right foot with unspecified severity: Secondary | ICD-10-CM | POA: Insufficient documentation

## 2018-04-03 NOTE — Progress Notes (Signed)
[name] ([id number]) Visit Report for 03/25/2018 Arrival Information Details Patient Name: [name], [name] Date of Service: 03/25/2018 10:00 AM Medical Record Number: [id number] Patient Account Number: [id number] Date of Birth/Sex: [birth year] (77 y.o. F) Treating RN: Ahmed Prima Primary Care Taylee Gunnells: Beverlyn Roux Other Clinician: Referring Saffron Busey: Beverlyn Roux Treating Rodgerick Gilliand/Extender: Tito Dine in Treatment: 46 Visit Information History Since Last Visit All ordered tests and consults were completed: No Patient Arrived: Wheel Chair Added or deleted any medications: No Arrival Time: 10:24 Any new allergies or adverse reactions: No Accompanied By: husband Had a fall or experienced change in No Transfer Assistance: EasyPivot Patient activities of daily living that may affect Lift risk of falls: Patient Identification Verified: Yes Signs or symptoms of abuse/neglect since last visito No Secondary Verification Process Yes Hospitalized since last visit: No Completed: Implantable device outside of the clinic excluding No Patient Requires Transmission-Based No cellular tissue based products placed in the center Precautions: since last visit: Patient Has Alerts: No Has Dressing in Place as Prescribed: Yes Pain Present Now: Yes Electronic Signature(s) Signed: 03/25/2018 4:43:18 PM By: Alric Quan Entered By: Alric Quan on 03/25/2018 10:24:57 Mcdougall, Lafaye V. ([id number]) -------------------------------------------------------------------------------- Clinic Level of Care Assessment Details Patient Name: [name], [name] Date of Service: 03/25/2018 10:00 AM Medical Record Number: [id number] Patient Account Number: [id number] Date of Birth/Sex: [birth year] (77 y.o. F) Treating RN: Montey Hora Primary Care Girlie Veltri: Beverlyn Roux Other Clinician: Referring Evren Shankland: Beverlyn Roux Treating Kylil Swopes/Extender: Tito Dine in Treatment:  72 Clinic Level of Care Assessment Items TOOL 4 Quantity Score []  - Use when only an EandM is performed on FOLLOW-UP visit 0 ASSESSMENTS - Nursing Assessment / Reassessment X - Reassessment of Co-morbidities (includes updates in patient status) 1 10 X- 1 5 Reassessment of Adherence to Treatment Plan ASSESSMENTS - Wound and Skin Assessment / Reassessment X - Simple Wound Assessment / Reassessment - one wound 1 5 []  - 0 Complex Wound Assessment / Reassessment - multiple wounds []  - 0 Dermatologic / Skin Assessment (not related to wound area) ASSESSMENTS - Focused Assessment []  - Circumferential Edema Measurements - multi extremities 0 []  - 0 Nutritional Assessment / Counseling / Intervention X- 1 5 Lower Extremity Assessment (monofilament, tuning fork, pulses) []  - 0 Peripheral Arterial Disease Assessment (using hand held doppler) ASSESSMENTS - Ostomy and/or Continence Assessment and Care []  - Incontinence Assessment and Management 0 []  - 0 Ostomy Care Assessment and Management (repouching, etc.) PROCESS - Coordination of Care X - Simple Patient / Family Education for ongoing care 1 15 []  - 0 Complex (extensive) Patient / Family Education for ongoing care []  - 0 Staff obtains Programmer, systems, Records, Test Results / Process Orders []  - 0 Staff telephones HHA, Nursing Homes / Clarify orders / etc []  - 0 Routine Transfer to another Facility (non-emergent condition) []  - 0 Routine Hospital Admission (non-emergent condition) []  - 0 New Admissions / Biomedical engineer / Ordering NPWT, Apligraf, etc. []  - 0 Emergency Hospital Admission (emergent condition) X- 1 10 Simple Discharge Coordination Pownall, Benicia V. ([id number]) []  - 0 Complex (extensive) Discharge Coordination PROCESS - Special Needs []  - Pediatric / Minor Patient Management 0 []  - 0 Isolation Patient Management []  - 0 Hearing / Language / Visual special needs []  - 0 Assessment of Community assistance  (transportation, D/C planning, etc.) []  - 0 Additional assistance / Altered mentation []  - 0 Support Surface(s) Assessment (bed, cushion, seat, etc.) INTERVENTIONS - Wound Cleansing / Measurement X - Simple Wound  Cleansing - one wound 1 5 []  - 0 Complex Wound Cleansing - multiple wounds X- 1 5 Wound Imaging (photographs - any number of wounds) []  - 0 Wound Tracing (instead of photographs) X- 1 5 Simple Wound Measurement - one wound []  - 0 Complex Wound Measurement - multiple wounds INTERVENTIONS - Wound Dressings []  - Small Wound Dressing one or multiple wounds 0 X- 1 15 Medium Wound Dressing one or multiple wounds []  - 0 Large Wound Dressing one or multiple wounds []  - 0 Application of Medications - topical []  - 0 Application of Medications - injection INTERVENTIONS - Miscellaneous []  - External ear exam 0 []  - 0 Specimen Collection (cultures, biopsies, blood, body fluids, etc.) []  - 0 Specimen(s) / Culture(s) sent or taken to Lab for analysis []  - 0 Patient Transfer (multiple staff / Civil Service fast streamer / Similar devices) []  - 0 Simple Staple / Suture removal (25 or less) []  - 0 Complex Staple / Suture removal (26 or more) []  - 0 Hypo / Hyperglycemic Management (close monitor of Blood Glucose) []  - 0 Ankle / Brachial Index (ABI) - do not check if billed separately X- 1 5 Vital Signs Mcquade, Josi V. ([id number]) Has the patient been seen at the hospital within the last three years: Yes Total Score: 85 Level Of Care: New/Established - Level 3 Electronic Signature(s) Signed: 03/25/2018 5:18:29 PM By: Montey Hora Entered By: Montey Hora on 03/25/2018 10:56:32 Sage, Arleatha [name] ([id number]) -------------------------------------------------------------------------------- Encounter Discharge Information Details Patient Name: [name], [name] Date of Service: 03/25/2018 10:00 AM Medical Record Number: [id number] Patient Account Number: [id number] Date of Birth/Sex: 1940-12-02  (77 y.o. F) Treating RN: Roger Shelter Primary Care Neesha Langton: Beverlyn Roux Other Clinician: Referring Ross Hefferan: Beverlyn Roux Treating Elgar Scoggins/Extender: Tito Dine in Treatment: 65 Encounter Discharge Information Items Discharge Condition: Stable Ambulatory Status: Wheelchair Discharge Destination: Home Transportation: Private Auto Schedule Follow-up Appointment: Yes Clinical Summary of Care: Electronic Signature(s) Signed: 03/25/2018 12:51:13 PM By: Roger Shelter Entered By: Roger Shelter on 03/25/2018 11:01:17 Puleo, Kerie V. ([id number]) -------------------------------------------------------------------------------- Lower Extremity Assessment Details Patient Name: Huyett, Joletta V. Date of Service: 03/25/2018 10:00 AM Medical Record Number: [id number] Patient Account Number: [id number] Date of Birth/Sex: [birth year] (77 y.o. F) Treating RN: Ahmed Prima Primary Care Caragh Gasper: Beverlyn Roux Other Clinician: Referring Jolita Haefner: Beverlyn Roux Treating Deasha Clendenin/Extender: Tito Dine in Treatment: 72 Edema Assessment Assessed: [Left: No] [Right: No] [Left: Edema] [Right: :] Calf Left: Right: Point of Measurement: 35 cm From Medial Instep cm 47.2 cm Ankle Left: Right: Point of Measurement: 12 cm From Medial Instep cm 33.2 cm Vascular Assessment Pulses: Dorsalis Pedis Palpable: [Right:Yes] Posterior Tibial Extremity colors, hair growth, and conditions: Extremity Color: [Right:Hyperpigmented] Temperature of Extremity: [Right:Warm] Capillary Refill: [Right:< 3 seconds] Toe Nail Assessment Left: Right: Thick: Yes Discolored: Yes Deformed: Yes Improper Length and Hygiene: Yes Electronic Signature(s) Signed: 03/25/2018 4:43:18 PM By: Alric Quan Entered By: Alric Quan on 03/25/2018 10:36:22 Woodrick, Santiago V. ([id number]) -------------------------------------------------------------------------------- Multi Wound Chart Details Patient  Name: Cooler, Jacklyn V. Date of Service: 03/25/2018 10:00 AM Medical Record Number: [id number] Patient Account Number: [id number] Date of Birth/Sex: [birth year] (77 y.o. 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] Comorbid History: [7:Cataracts, Chronic sinus problems/congestion, Congestive Heart Failure, Hypertension, Type II Diabetes] [N/A:N/A] Date Acquired: [7:08/06/2013] [N/A:N/A] Weeks of Treatment: [7:72] [N/A:N/A] Wound Status: [7:Open] [N/A:N/A] Measurements L x W x D [7:2.8x1x0.3] [N/A:N/A] (cm) Area (cm) : [7:2.199] [N/A:N/A] Volume (cm) : [7:0.66] [N/A:N/A] % Reduction in Area: [7:-86.70%] [N/A:N/A] % Reduction in Volume: [7:-87.00%] [N/A:N/A] Classification: [7:Grade 2] [N/A:N/A] Exudate Amount: [7:Large] [N/A:N/A] Exudate Type: [7:Serous] [N/A:N/A] Exudate Color: [7:amber] [N/A:N/A] Wound Margin: [7:Distinct, outline attached] [N/A:N/A] Granulation Amount: [7:Small (1-33%)] [N/A:N/A] Granulation Quality: [7:Pink] [N/A:N/A] Necrotic Amount: [7:Large (67-100%)] [N/A:N/A] Exposed Structures: [7:Fascia: No Fat Layer (Subcutaneous Tissue) Exposed: No Tendon: No Muscle: No Joint: No Bone: No] [N/A:N/A] Epithelialization: [7:None] [N/A:N/A] Periwound Skin Texture: [7:No Abnormalities Noted] [N/A:N/A] Periwound Skin Moisture: [7:Maceration: Yes] [N/A:N/A] Periwound Skin Color: No Abnormalities Noted N/A N/A Temperature: No Abnormality N/A  N/A Tenderness on Palpation: Yes N/A N/A Wound Preparation: Ulcer Cleansing: N/A N/A Rinsed/Irrigated with Saline Topical Anesthetic Applied: Other: lidocaine 4% Treatment Notes Electronic Signature(s) Signed: 03/25/2018 5:18:29 PM By: Montey Hora Entered By: Montey Hora on 03/25/2018 10:41:47 Hessel, Latoiya [name] ([id number]) -------------------------------------------------------------------------------- Multi-Disciplinary Care Plan Details Patient Name: Toops, Amaya V. Date of Service: 03/25/2018 10:00 AM Medical Record Number: [id number] Patient Account Number: [id number] Date of Birth/Sex: 06/15/1941 (77 y.o. F) Treating RN: Montey Hora Primary Care Rylinn Linzy: Beverlyn Roux Other Clinician: Referring Piera Downs: Beverlyn Roux Treating Allahna Husband/Extender: Tito Dine in Treatment: 13 Active Inactive ` Abuse / Safety / Falls / Self Care Management Nursing Diagnoses: Impaired physical mobility Potential for falls Goals: Patient will remain injury free Date Initiated: 11/06/2016 Target Resolution Date: 12/30/2016 Goal Status: Active Patient/caregiver will verbalize understanding of skin care regimen Date Initiated: 11/06/2016 Target Resolution Date: 12/30/2016 Goal Status: Active Interventions: Assess fall risk on admission and as needed Treatment Activities: Patient referred to home care : 11/06/2016 Notes: ` Nutrition Nursing Diagnoses: Potential for alteratiion in Nutrition/Potential for imbalanced nutrition Goals: Patient/caregiver verbalizes understanding of need to maintain therapeutic glucose control per primary care physician Date Initiated: 11/06/2016 Target Resolution Date: 12/30/2016 Goal Status: Active Interventions: Provide education on elevated blood sugars and impact on wound healing Notes: ` Orientation to the Wound Care Program Nursing Diagnoses: Knowledge deficit related to the wound healing center program Sinkfield, KEYLEE SHRESTHA  ([id number]) Goals: Patient/caregiver will verbalize understanding of the [facility] Program Date Initiated: 11/06/2016 Target Resolution Date: 12/30/2016 Goal Status: Active Interventions: Provide education on orientation to the wound center Notes: ` Venous Leg Ulcer Nursing Diagnoses: Actual venous Insuffiency (use after diagnosis is confirmed) Knowledge deficit related to disease process and management Goals: Non-invasive venous studies are completed as ordered Date Initiated: 11/06/2016 Target Resolution Date: 12/30/2016 Goal Status: Active Patient/caregiver will verbalize understanding of disease process and disease management Date Initiated: 11/06/2016 Target Resolution Date: 12/30/2016 Goal Status: Active Interventions: Assess peripheral edema status every visit. Notes: ` Wound/Skin Impairment Nursing Diagnoses: Impaired tissue integrity Knowledge deficit related to smoking impact on wound healing Knowledge deficit related to ulceration/compromised skin integrity Goals: Ulcer/skin breakdown will heal within 14 weeks Date Initiated: 11/06/2016 Target Resolution Date: 02/05/2017 Goal Status: Active Interventions: Assess ulceration(s) every visit Treatment Activities: Skin care regimen initiated : 11/06/2016 Notes: Electronic Signature(s) Signed: 03/25/2018 5:18:29 PM By: Ammie Dalton ([id number]) Entered By: Montey Hora on 03/25/2018 10:41:23 Loomis, Annina V. ([id number]) -------------------------------------------------------------------------------- Pain Assessment Details Patient Name: Cropp, Dorna V. Date of Service: 03/25/2018 10:00 AM Medical Record Number: [id number] Patient Account Number: [id number]  Date of Birth/Sex: 1941/06/16 (77 y.o. F) Treating RN: Ahmed Prima Primary Care [name] [name]: Beverlyn Roux Other Clinician: Referring Jeraldean Wechter: Beverlyn Roux Treating Karrie Fluellen/Extender: Tito Dine in Treatment: 72 Active  Problems Location of Pain Severity and Description of Pain Patient Has Paino No Site Locations Pain Management and Medication Current Pain Management: Electronic Signature(s) Signed: 03/25/2018 4:43:18 PM By: Alric Quan Entered By: Alric Quan on 03/25/2018 10:25:07 Saltsman, Kayleeann [name] ([id number]) -------------------------------------------------------------------------------- Patient/Caregiver Education Details Patient Name: Viloria, Ruby V. Date of Service: 03/25/2018 10:00 AM Medical Record Number: [id number] Patient Account Number: [id number] Date of Birth/Gender: Oct 10, 1940 (77 y.o. F) Treating RN: Roger Shelter Primary Care Physician: Beverlyn Roux Other Clinician: Referring Physician: Beverlyn Roux Treating Physician/Extender: Tito Dine in Treatment: 11 Education Assessment Education Provided To: Patient Education Topics Provided Wound Debridement: Handouts: Wound Debridement Methods: Explain/Verbal Responses: State content correctly Wound/Skin Impairment: Handouts: Caring for Your Ulcer Methods: Explain/Verbal Responses: State content correctly Electronic Signature(s) Signed: 03/25/2018 12:51:13 PM By: Roger Shelter Entered By: Roger Shelter on 03/25/2018 11:01:32 Melikian, Sharni V. ([id number]) -------------------------------------------------------------------------------- Wound Assessment Details Patient Name: Malina, Fenna V. Date of Service: 03/25/2018 10:00 AM Medical Record Number: [id number] Patient Account Number: [id number] Date of Birth/Sex: 1940-09-14 (77 y.o. F) Treating RN: Ahmed Prima Primary Care Aadhira Heffernan: Beverlyn Roux Other Clinician: Referring Lorenso Quirino: Beverlyn Roux Treating Brad Mcgaughy/Extender: Tito Dine in Treatment: 72 Wound Status Wound Number: 7 Primary Diabetic Wound/Ulcer of the Lower Extremity Etiology: Wound Location: Right Metatarsal head first - Dorsal Wound Open Wounding Event: Gradually  Appeared Status: Date Acquired: 08/06/2013 Comorbid Cataracts, Chronic sinus problems/congestion, Weeks Of Treatment: 72 History: Congestive Heart Failure, Hypertension, Type II Clustered Wound: No Diabetes Photos Photo Uploaded By: Alric Quan on 03/25/2018 16:29:30 Wound Measurements Length: (cm) 2.8 Width: (cm) 1 Depth: (cm) 0.3 Area: (cm) 2.199 Volume: (cm) 0.66 % Reduction in Area: -86.7% % Reduction in Volume: -87% Epithelialization: None Tunneling: No Undermining: No Wound Description Classification: Grade 2 Wound Margin: Distinct, outline attached Exudate Amount: Large Exudate Type: Serous Exudate Color: amber Foul Odor After Cleansing: No Slough/Fibrino Yes Wound Bed Granulation Amount: Small (1-33%) Exposed Structure Granulation Quality: Pink Fascia Exposed: No Necrotic Amount: Large (67-100%) Fat Layer (Subcutaneous Tissue) Exposed: No Necrotic Quality: Adherent Slough Tendon Exposed: No Muscle Exposed: No Joint Exposed: No Bone Exposed: No Periwound Skin Texture Jipson, Latausha V. ([id number]) Texture Color No Abnormalities Noted: No No Abnormalities Noted: No Moisture Temperature / Pain No Abnormalities Noted: No Temperature: No Abnormality Maceration: Yes Tenderness on Palpation: Yes Wound Preparation Ulcer Cleansing: Rinsed/Irrigated with Saline Topical Anesthetic Applied: Other: lidocaine 4%, Treatment Notes Wound #7 (Right, Dorsal Metatarsal head first) 1. Cleansed with: Clean wound with Normal Saline 2. Anesthetic Topical Lidocaine 4% cream to wound bed prior to debridement 4. Dressing Applied: Iodoflex 5. Secondary Dressing Applied Dry Gauze Kerlix/Conform Notes darko shoe, heel cup Electronic Signature(s) Signed: 03/25/2018 4:43:18 PM By: Alric Quan Entered By: Alric Quan on 03/25/2018 10:33:26 Friesen, Gayleen V. ([id number]) -------------------------------------------------------------------------------- Vitals  Details Patient Name: Smoot, Syana V. Date of Service: 03/25/2018 10:00 AM Medical Record Number: [id number] Patient Account Number: [id number] Date of Birth/Sex: Dec 09, 1940 (77 y.o. F) Treating RN: Ahmed Prima Primary Care Cranford Blessinger: Beverlyn Roux Other Clinician: Referring Harjit Douds: Beverlyn Roux Treating Bryan Goin/Extender: Tito Dine in Treatment: 72 Vital Signs Time Taken: 10:25 Temperature (F): 97.9 Height (in): 64 Pulse (bpm): 79 Weight (lbs): 200 Respiratory Rate (breaths/min): 18 Body Mass Index (BMI): 34.3 Blood Pressure (mmHg): 143/57 Reference Range: 80 - 120 mg / dl  Electronic Signature(s) Signed: 03/25/2018 4:43:18 PM By: Alric Quan Entered By: Alric Quan on 03/25/2018 10:29:08

## 2018-04-07 NOTE — Progress Notes (Signed)
CARMIE, LANPHER ([id number]) Visit Report for 03/25/2018 HPI Details Patient Name: [name], [name] Date of Service: 03/25/2018 10:00 AM Medical Record Number: [id number] Patient Account Number: [id number] Date of Birth/Sex: 06-04-1941 (77 y.o. F) Treating RN: Cornell Barman Primary Care Provider: Beverlyn Roux Other Clinician: Referring Provider: Beverlyn Roux Treating Provider/Extender: Tito Dine in Treatment: 72 History of Present Illness HPI Description: 08/13/16: This is a 77 year old woman who came predominantly for review of 3 cm in diameter circular wound to the left anterior lateral leg. She was in the ER on 08/01/16 I reviewed their notes. There was apparently pus coming out of the wound at that time and the patient arrived requesting debridement which they don't do in the emergency room. Nevertheless I can't see that they did any x-rays. There were no cultures done. She is a type II diabetic and I a note after the patient was in the clinic that she had a bypass graft from the popliteal to the tibial on the right on 02/28/16. She also had a right greater saphenous vein harvest on the same date for arterial bypass. She is going to have vascular studies including ABIs T ABIs on the right on 08/28/16. The patient's surgery was on 02/28/16 by Dr. Vallarie Mare she had a right below the knee popliteal artery to peroneal artery bypass with reverse greater saphenous vein and an endarterectomy of the mid segment peroneal artery. Postoperatively she had a strong mild monophasic peroneal signal with a pink foot. It would appear that the patient is had some nonhealing in the surgical saphenous vein harvest site on the left leg. Surprisingly looking through [facility] I cannot see much information about this at all. Dr. Lucious Groves notes from 05/29/16 show that the patient's wounds "are not healed" the right first metatarsal wound healed but then opened back up. 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 At that point the patient had a wound VAC to her right calf with regards to the wounds on her dorsal right toe would appear that these are felt to be arterial wounds. She has had surgery on the metatarsal phalangeal in 2015 I Dr. Doran Durand secondary to a right metatarsal phalangeal joint fracture. She is apparently had discoloration around this area since then. 08/28/16; patient arrives with her wounds in much the same condition. The linear vein harvest site and the circular wound below it which I think was a blister. She also has to probing holes in her right great toe and a necrotic eschar on the right second toe. Because of these being arterial wounds I reduced her compression from 3-2 layers this seems to of done satisfactorily she has not had any problems. I cannot see that she is actually had an x-ray ====== 11/06/16 the patient comes in for evaluation of her right lower extremity ulcers. She was here in January 2018 for 2 visits subsequently ended up in the hospital with pneumonia and then to rehabilitation. She has now been discharged from rehabilitation and is home. She has multiple ulcerations to her right lower extremity including the foot and toes. She does have home health in place and they have been placing alginate to the ulcers. She is followed by Dr. Bridgett Larsson of vascular medicine. 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 She recently saw him on 3/23. In office ABIs were:  Right 0.48 with monophasic flow to the DP, PT, peroneal Left 0.63 with monophasic flow to the DP and PT Her arterial studies indicated a patent right below knee popliteal to peroneal bypass She had an MRI in February 2008 that was negative frosty myelitis but this showed general soft tissue edema in the right foot and lower extremity concerning  for cellulitis She is a diabetic, managed with insulin. Her hemoglobin A1c in December 2017 was 8.4 which is a trend up from previous levels. She had blood work in February 2018 which revealed an albumin of 2.6 this appears to be relatively acute as an albumin in November 2017 was 3.7 11/13/16; this is a patient I have not seen since February who is readmitted to our clinic last week. 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 Bridgett Larsson. I have reviewed Dr. Lianne Moris notes from March/23/18. Doppler ABI on that date showed an ABI on the right of 0.48 and on the left of 0.63. Dorsalis pedis waveforms Koranda, Sophiagrace V. ([id number]) were monophasic bilaterally. There was no waveforms detected at the posterior tibial on the right, monophasic on the left. Dr. Lianne Moris comments were that this patient would have follow-up vascular studies in 3 months including ABIs and right lower extremity arterial duplex. She had an MRI in February that was negative for osteomyelitis but showed generalized edema in the foot. Last albumin I see was in January at 3.4 we have been using Santyl to the 4 wounds on the right leg the patient is noted today to have widespread edema well up towards her groin this is pitting 2-3+. 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 Normal ejection fraction. 11/20/16; patient has a follow-up appointment with Dr. Bridgett Larsson on April 23. She is still complaining of a lot of pain in the right foot and right leg. It is not clear to me that this is at all positional however I think it is clear claudication with minimal activity perhaps at rest. At our suggestion she is return to her primary physician's office tomorrow with regards to her pitting lower extremity bilateral edema that I reviewed in detail last week 11/27/16; the follow-up with Dr. Bridgett Larsson was actually on May 23 on April 23 as I  stated in my note last week. N/A case all of her wounds seems somewhat smaller. The 2 on the right leg are definitely smaller. The areas on the dorsal right first toe, right third toe and the lateral part of the right fifth metatarsal head all looks smaller but have tightly adherent surfaces. We have been using Santyl 12/04/16; follow-up with Dr. Bridgett Larsson on May 23. 2 small open wounds on the right leg continue to get smaller. The area on the right third total lateral aspect of the right fifth toe also look better. The remaining area on the dorsal first toe still has some depth to it. We have been using Santyl to the toes and collagen on the right 12/11/16; according to patient's husband the follow-up with Dr. Bridgett Larsson is not until July. All of the wounds on the right leg are measuring smaller. We have been using some combination of Prisma and Santyl although I think we can go to straight Prisma today. There may have been some confusion with home health about the primary dressing orders here. 12/25/16; the patient has had some healing this week. The area on her right  lateral fifth metatarsal head, right third toe are both healed lower right leg is healed. In the vein harvest site superiorly she has one superficial open area. On the dorsal aspect of her right great toe/MTP joint the wound is now divided into 2 however the proximal area is deep and there is palpable bone 01/01/17; still an open area in the middle of her original right surgical scar. The area on the right third toe and right lateral fifth metatarsal head remained closed. Problematic area on the dorsal aspect of the toe. Previous surgery in this area line 01/08/17 small open area on the original scar on her upper anterior leg although this is closing. X-ray I did of the right first toe did not show underlying bony abnormality. Still this area on the dorsal first toe probes to bone. We have been using Prisma 01/15/17; small open area in the original scar  in the upper anterior leg is almost fully closed. She has 2 open areas over the dorsal aspect of the right first toe that probes to bone. Used and a form starting last week. Vigorous bone scraping that I did last week showed few methicillin sensitive staph aureus. She is allergic to penicillin and sulfa drugs. I'm going to give her 2 weeks of doxycycline. She will need an MRI with contrast. 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 She continues on doxycycline for a bone scraping that showed methicillin sensitive staph aureus [allergic to penicillin and sulfa]. We have been using Endo form to the wound. 01/29/17; surprisingly her MRI did not show osteomyelitis. She continues on doxycycline for a bone scraping that showed methicillin sensitive staph aureus with allergies to penicillin and sulfa. We have been using Endo form to the wound. Unfortunately I cannot get a surface on this visit looks like it is able to support a healing state. Proximally there is still exposed bone. There is no overt soft tissue tenderness. Her MRI did show a previous fracture was surgery to this area but no hardware 02/12/17 on evaluation today patient appears to be doing well in regard to her lower extremity wound. She does have some mild discomfort but this is minimal. There's no evidence of infection. Her left great toe nail has somewhat been lifted up and there is a little bit of slight bleeding underneath but this is still firmly attached. 02/19/17; she ran out of doxycycline 2 days ago. Nevertheless I would like to continue this for 2 weeks to make a full 6 weeks of therapy as the bone scraping that I did from the open area on the dorsal right first toe showed MRSA. This should complete antibiotic therapy. 02/26/17; the patient is completing her 6 weeks of oral doxycycline. Deep wound on the dorsal right toe. This still has some exposed bone  proximally. She does have a reasonably granulated surface albeit thin over bone. I've been using Endo form have made application for an amniotic skin sub 03/05/17; the patient has completed 6 weeks of doxycycline. This is a deep wound on the dorsal right toe which has exposed bone proximally. She has granulation over most of this wound albeit a thin layer. I've been using Endo form. Still do not have approval for Affinity 03/13/17 on evaluation today patient's right foot wound appears to be doing better measurement wise compared to her last evaluation. She has been tolerating the dressing change without complication and  does have a appointment with the dietary that has been made as far as referral is concerned there just waiting for her and transportation to contact them back for an actual date. Nonetheless patient has been having less pain at this point. No fevers, chills, nausea, or vomiting noted at this time. 03/26/17; linear wound over the dorsal aspect of her right great toe. At one point this had exposed bone on the most superior aspect however I am pleased to see today that this appears to have a surface of granulation. Apparently product was applied Skiff, Nylani V. ([id number]) for but denied by [facility]. We'll need to see what that was. The patient has known PAD status post revascularization. MRI did not show osteomyelitis which was done in June 04/02/17; continued improvement using Endoform. She was approved for [employer] but hasn't over $735 co-pay per application, this is beyond her means 04/09/17; continues on Endoform. 04/16/17; appears to be doing nicely continuing on Endoform 04/22/17; patient did not have her dressing changed all week because of the weather. Using Endoform. Base of the wound looks healthy elbow there appears to events surrounding maceration perhaps because of the drainage was not changing the wound dressing 04/30/17 wound today continues to close and except for a small divot on  the superior part of the wound. This area did not probe the bone but I found this a little concerning as this was the area with exposed bone before we be able to get this to granulate forward. As I remember things she is not a candidate for skin substitutes secondary to an outlandish co-pay. I asked her husband to look into the out of pocket max for medications if he has 1 [Regranex] or totally for skin substitutes 05/07/17; the patient arrives today with a wound roughly the same size although under careful inspection under the light there appears to be some epithelialization. Her intake nurse noted that the Endoform seemed to be placed over the wound rather than in the deeper divots they could really benefit from the Endoform. At this point I have no plans to change the Endoform as at one point this was at least 33% exposed bone and the rest of the wound very close to the underlying phalanx 05/14/17; no major change in the size or appearance of the wound. We have been using Endoform for a prolonged period of time with reasonably good improvement in the epithelialization i.e. no exposed bone especially proximally. Unless we've not made a lot of changes in the last several weeks. She does not complain of pain. She was revascularized early this year or perhaps late last year by Dr. Bridgett Larsson and I wonder if he will need to have a look at her, I will need to review the actual vascular procedure which I think was a distal popliteal bypass 05/21/17; switched to either for a blue last week. Patient arrived in clinic with the wound not measuring any better but the surface looking some better. Unfortunately she had some surface slough and when I went to remove this superiorly she had exposed bone. Previously she had had exposed bone in the inferior part of the wound however this is granulated over some weeks ago. Clearly this is a major step back for her. With some difficulty I was able to obtain a piece of the  bone probing through the superior part of the wound for culture. We did not send pathology. It is likely that she is going to need imaging of this site but  I did not order this today 05/28/17; bone culture I took last week showed MSSA. She still has exposed bone however I could not get another piece for pathology. She had an MRI 4 months ago that did not show osteoarthritic at time. Wound rapidly deteriorated superiorly and I suspect this is underlying osteomyelitis. We have been using Hydrofera Blue 06/04/17 on evaluation today patient's wound appears to actually be doing a little bit better visually compared to last week's evaluation which is good news. Nonetheless she does have osteomyelitis of the toe which does not appear to be MRSA. No fevers, chills, nausea, or vomiting noted at this time. She is having no discomfort. 06/11/17; patient's wound looks a little healthier than I remember seeing it 2 weeks ago. There is no exposed bone and the surface of the wound appears well granulated. We've been using hydrofera blue. I note that she was started on doxycycline which was MSSA. Her appointment with Dr. Ola Spurr is on November 12 I believe. 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 Vallarie Mare of vascular surgery on 11/9. He offered right leg angiography possible intervention however the patient refused. I've discussed this with the patient's husband today she did not want any further procedures. Also noteworthy that Dr. Vallarie Mare stated that this wound had not healed in 3 years, I was not aware of this degree of chronicity in this area. She has underlying osteomyelitis by bone culture. Culture of this grew MSSA, she is allergic to penicillin and therefore not a candidate for beta lactams. She saw Dr. Ola Spurr of infectious disease this morning, he continued her on doxycycline for another month and apparently sent in a prescription. We have been using Hydrofera Blue. ABI's  update by Dr. Bridgett Larsson right 0.62, left 0.89. Monophasic wave forms 06/25/17 on evaluation today patient appears to be doing well in regard to her right great toe wound. She is still taking the doxycycline as prescribed by Dr. Ola Spurr. The Hydrofera Blue Dressing's to be doing as well as anything has for her up to this point and we are still waiting on an insurance authorization for the [place]. She is having no pain which is good news. No fevers, chills, nausea, or vomiting noted at this time. 07/02/17; still taking doxycycline as prescribed by Dr. Ola Spurr. Hydrofera Blue continues. We have no palpable bone. Still have not had had approval of Apligraf 08/06/16; the patient arrives today with [facility] even though we apparently had changed to Sorbact last time. Her co-pay for Regranex is $389 in discussion with the patient and her husband this was excessive. We'll continue with the Sorbact and standard dressings for now 08/20/17; we have been using sorbact. The wound bed still requires debridement. Wound measurements are slightly better. 09/03/17;using Sorbact.no debridement today. Wound measurements slightly better. There is some discoloration of the tip of her toelooks like bruising Shrode, Lenzi V. ([id number]) 09/17/17; using Sorbact. No debridement today. Wound dimensions are about the same. Still some discoloration at the tip of her great toe. This does not look any worse than last time 10/01/17; using sorbact right great toe I thought she might have surface epithelialization last time although it is certainly not look like that today nevertheless her dimensions are better. Continued surface eschar on the tip of her toe but this is not progressing. She is actually complaining of the left great toe 10/15/17; this is a very difficult area on the dorsal aspect of her proximal right great toe. At one point this  wound had exposed bone however we have managed to get some degree of  epithelialization. She was revascularized by Dr. Vallarie Mare in [place]. She saw Dr. Ola Spurr for underlying osteomyelitis. She is not a candidate for advanced treatment options [insurance issues]. She comes in today with a new area on the tip of her right great toe. The exact history here is unclear. We have been using sorbact 10/29/17; patient arrives today with very significant bilateral increase edema which appears to extend into the thighs. This is tight and not particularly pitting however it looks a lot more impressive than I remember. Not surprisingly the wounds on her right great toe have increased in size with weeping edema fluid as the edema extends into the dorsal foot itself. She also has a wound on the tip of her right great toe which is a new wound from 2 weeks ago. We have been using sorbact. 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 He is on Demadex presumably twice a day at 20 mg. I have asked her family to make her an appointment with her primary physicians at the [facility] to go over this degree of edema. This is causing I think deterioration in the right great toe wound. She also has significant PAD and is status post right leg bypass graft. I think this was done by [facility]. I believe they also said that there was nothing further that could be done with regards to her vascular status. 11/12/17; patient is going to see her primary doctor this afternoon with regards to lower extremity edema. She has 2 open wounds on the right great toe the original wound on the dorsal proximal part and then a more necrotic looking area on the tip of her right great toe. We took some time today to review her vascular status. The patient had a relate below knee popliteal to peroneal artery bypass with reversed greater saphenous vein on 06/13/17; she also had endarterectomy of the midsegment of the  peroneal artery. 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 I think at some point in time after that she was offered an arteriogram on the right but she declined. I've used Silver collagen to her wounds recently. 11/26/17; patient has 2 open wounds on the right great toe tip as well as the dorsal aspect of her right great toe. She has known PAD. I've been trying to get her back to see vein and vascular in [facility] to see if there are options for endovascular surgery to help with perfusion although the patient and her husband may not want any more surgery. We've been using silver alginate because of maceration. She does not have an option for advanced treatment/skin substitutes 12/10/17; continued open wounds much the same as 2 weeks ago on the dorsal aspect of the right great toe on the right great toe tip. I've encouraged to follow-up with Dr. Bridgett Larsson of vein and vascular to explore the possibility that there may be correctable ischemia involved in nonhealing these areas. We made considerable progress on the dorsal right great toe however it is stalled recently. We do not have an advanced treatment option. Originally this had exposed bone however there is a granulated base. 12/24/17; patient went back to see vein and vascular in [place]. She was seen by Manus Gunning NP. ABIs  on the right were noted to be 0.84 and TBI of the right and 0.62. On the left her ABI was 0.82 and her TBI of 0.75. Waveforms were monophasic at the posterior tibial and dorsalis pedis bilaterally. 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 Her husband reiterates today that she does not want an arteriogram therefore I'm  not going to press the issue. We've been using silver collagen to the wounds on the tip of the right great toe and dorsal surface of the right great toe. 01/07/18; patient arrives today with the tip of her toe healed. An cerebral amount of slough over the wound on the dorsal toe. As noted on 12/24/17 no options currently for revascularization the patient will not allow an arteriogram 01/21/18; typical of her right great toe remains healed. Still large amount of slough over the wound on the dorsal toe. No options for revascularization. We've been using Medihoney 02/04/18; no major change. Still a punched out area on the right great toe dorsal wound. Covered in a difficult necrotic debris. 02/18/18; no major change. Still a punched out area over the right great toe. They're using [place] 03/04/18; no major change. The wound is stalled. Still a punched out area over the right dorsal great toe. We've been using medihone for about a month not much change in the wound dimensions or the characteristics of the wound surface. We've previously used Iodoflex, Hydrofera Blue, sorbact, and I think at one point [place]. She has PAD and does not have any revascularization options although a wound distally on the tip of the great toe did close over 03/24/18; absolutely no change. Still adherent debris over the wound surface there is no palpable bone but the wound still has Carnero, Rene V. ([id number]) some depth. This is predominantly a arterial wound and she basically has refused further attempts at revascularization. I would consider this a totally palliative situation except for she managed to heal the wound at the tip of the same toe some months ago. I changed her back to Iodoflex The other issue is that [facility] called and wants to pare back on the visits which have been 3 times a week. They will drop to 2 times a week next week Electronic Signature(s) Signed: 03/25/2018 5:35:06 PM By: Linton Ham MD Entered By: Linton Ham on 03/25/2018 12:25:27 Barham, Miel [name] ([id number]) -------------------------------------------------------------------------------- Physical Exam Details Patient Name: Sappington, Braylen V. Date of Service: 03/25/2018 10:00 AM Medical Record Number: [id number] Patient Account Number: [id number] Date of Birth/Sex: 23-May-1941 (77 y.o. F) Treating RN: Cornell Barman Primary Care Provider: Beverlyn Roux Other Clinician: Referring Provider: Beverlyn Roux Treating Provider/Extender: Tito Dine in Treatment: 72 Constitutional Sitting or standing Blood Pressure is within target range for patient.. Pulse regular and within target range for patient.[name] Respirations regular, non-labored and within target range.. Temperature is normal and within the target range for the patient.[name] appears in no distress. Notes when exam; the original wounds on the dorsal aspect of the right great toe. Punched out area that at one time was down to bone however there is no palpable bone currently. I think we made some progress in filling this wound in with Endoform although the wound is stalled and really has been unchanged for many months. There is no evidence of infection Electronic Signature(s)  Signed: 03/25/2018 5:35:06 PM By: Linton Ham MD Entered By: Linton Ham on 03/25/2018 12:26:29 Henrie, [name] ([id number]) -------------------------------------------------------------------------------- Physician Orders Details Patient Name: Cohen, Cellie V. Date of Service: 03/25/2018 10:00 AM Medical Record Number: [id number] Patient Account Number: [id number] Date of Birth/Sex: 12/18/1940 (77 y.o. F) Treating RN: Montey Hora Primary Care Provider: Beverlyn Roux Other Clinician: Referring Provider: Beverlyn Roux Treating Provider/Extender: Tito Dine in Treatment: 28 Verbal / Phone Orders: No Diagnosis Coding Wound Cleansing Wound #7 Right,Dorsal Metatarsal head  first o Clean wound with Normal Saline. Anesthetic (add to Medication List) Wound #7 Right,Dorsal Metatarsal head first o Topical Lidocaine 4% cream applied to wound bed prior to debridement (In Clinic Only). Primary Wound Dressing Wound #7 Right,Dorsal Metatarsal head first o Iodoflex Secondary Dressing Wound #7 Right,Dorsal Metatarsal head first o ABD and Kerlix/Conform Dressing Change Frequency Wound #7 Right,Dorsal Metatarsal head first o Change Dressing Monday, Wednesday, Friday Follow-up Appointments Wound #7 Right,Dorsal Metatarsal head first o Return Appointment in 3 weeks. Edema Control Wound #7 Right,Dorsal Metatarsal head first o Elevate legs to the level of the heart and pump ankles as often as possible Additional Orders / Instructions Wound #7 Right,Dorsal Metatarsal head first o Increase protein intake. o Activity as tolerated Home Health Wound #7 Right,Dorsal Metatarsal head first o Continue Home Health Visits - [facility] may decrease patient's visit frequency to 2 times weekly - please notify [facility] if visits will be decreased to 1 time weekly o [place] Visits - [facility] twice weekly, [facility]-Wednesday o Home Health Nurse may visit PRN to address patientos wound care needs. o Home Health Nurse may visit PRN to address patientos wound care needs. GEORGIAN, MCCLORY ([id number]) o FACE TO FACE ENCOUNTER: MEDICARE and MEDICAID PATIENTS: I certify that this patient is under my care and that I had a face-to-face encounter that meets the physician face-to-face encounter requirements with this patient on this date. The encounter with the patient was in whole or in part for the following MEDICAL CONDITION: (primary reason for [place]) MEDICAL NECESSITY: I certify, that based on my findings, NURSING services are a medically necessary home health service. HOME BOUND STATUS: I certify that my clinical findings  support that this patient is homebound (i.e., Due to illness or injury, pt requires aid of supportive devices such as crutches, cane, wheelchairs, walkers, the use of special transportation or the assistance of another person to leave their place of residence. There is a normal inability to leave the home and doing so requires considerable and taxing effort. Other absences are for medical reasons / religious services and are infrequent or of short duration when for other reasons). o FACE TO FACE ENCOUNTER: MEDICARE and MEDICAID PATIENTS: I certify that this patient is under my care and that I had a face-to-face encounter that meets the physician face-to-face encounter requirements with this patient on this date. The encounter with the patient was in whole or in part for the following MEDICAL CONDITION: (primary reason for [place]) MEDICAL NECESSITY: I certify, that based on my findings, NURSING services are a medically necessary home health service. HOME BOUND STATUS: I certify that my clinical findings support that this patient is homebound (i.e., Due to illness or injury, pt requires aid of supportive devices such as crutches, cane, wheelchairs, walkers, the use of special transportation or the assistance of another person to leave their place of residence. There is a normal inability to leave the home and doing so  requires considerable and taxing effort. Other absences are for medical reasons / religious services and are infrequent or of short duration when for other reasons). o If current dressing causes regression in wound condition, may D/C ordered dressing product/s and apply Normal Saline Moist Dressing daily until next [place] / Other MD appointment. [place] of regression in wound condition at [phone]. o If current dressing causes regression in wound condition, may D/C ordered dressing product/s and apply Normal Saline Moist Dressing  daily until next [place] / Other MD appointment. [place] of regression in wound condition at [phone]. o Please direct any NON-WOUND related issues/requests for orders to patient's Primary Care Physician o Please direct any NON-WOUND related issues/requests for orders to patient's Primary Care Physician Electronic Signature(s) Signed: 03/25/2018 5:18:29 PM By: Montey Hora Signed: 03/25/2018 5:35:06 PM By: Linton Ham MD Entered By: Montey Hora on 03/25/2018 10:48:13 Landin, Chaunda V. ([id number]) -------------------------------------------------------------------------------- Problem List Details Patient Name: Grimaldo, Estel V. Date of Service: 03/25/2018 10:00 AM Medical Record Number: [id number] Patient Account Number: [id number] Date of Birth/Sex: May 18, 1941 (77 y.o. 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 She was offered an arteriogram but additional surgical Making : refused it. 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 Date of Service: 03/25/2018 10:00 AM Medical Record Number: [id number] Patient Account Number: [id number] Date of Birth/Sex: 12/17/1940 (77 y.o. F) Treating RN: Cornell Barman Primary Care Provider: Beverlyn Roux Other Clinician: Referring Provider: Beverlyn Roux Treating Provider/Extender: Tito Dine in Treatment: 72 Subjective History of Present Illness (HPI) 08/13/16: This is a 77 year old woman who came predominantly for review of 3 cm in diameter circular wound to the left anterior lateral leg. She was in the ER on 08/01/16 I reviewed their notes. There was apparently pus coming out of the wound at that time and the patient arrived requesting debridement which they don't do in the emergency room. Nevertheless I can't see that they did any x-rays. There were no cultures done. She is a type II diabetic and I a note after the patient was in the clinic that she had a bypass graft from the popliteal to the tibial on the right on 02/28/16. She also  had a right greater saphenous vein harvest on the same date for arterial bypass. She is going to have vascular studies including ABIs T ABIs on the right on 08/28/16. The patient's surgery was on 02/28/16 by Dr. Vallarie Mare she had a right below the knee popliteal artery to  peroneal artery bypass with reverse greater saphenous vein and an endarterectomy of the mid segment peroneal artery. Postoperatively she had a strong mild monophasic peroneal signal with a pink foot. It would appear that the patient is had some nonhealing in the surgical saphenous vein harvest site on the left leg. Surprisingly looking [facility] I cannot see much information about this at all. Dr. Lucious Groves notes from 05/29/16 show that the patient's wounds "are not healed" the right first metatarsal wound healed but then opened back up. 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 At that point the patient had a wound VAC to her right calf with regards to the wounds on her dorsal right toe would appear that these are felt to be arterial wounds. She has had surgery on the metatarsal phalangeal in 2015 I Dr. Doran Durand secondary to a right metatarsal phalangeal joint fracture. She is apparently had discoloration around this area since then. 08/28/16; patient arrives with her wounds in much the same condition. The linear vein harvest site and the circular wound below it which I think was a blister. She also has to probing holes in her right great toe and a necrotic eschar on the right second toe. Because of these being arterial wounds I reduced her compression from 3-2 layers this seems to of done satisfactorily she has not had any problems. I cannot see that she is actually had an x-ray ====== 11/06/16 the patient comes in for evaluation of her right lower extremity ulcers. She was here in January 2018 for 2 visits subsequently ended up in the hospital with pneumonia and then to rehabilitation. She has now been discharged from rehabilitation and is home. She has multiple ulcerations to her right lower extremity including the foot and toes. She does have home health in place and they have been  placing alginate to the ulcers. She is followed by Dr. Bridgett Larsson of vascular medicine. 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 She recently saw him on 3/23. In office ABIs were: Right 0.48 with monophasic flow to the DP, PT, peroneal Left 0.63 with monophasic flow to the DP and PT Her arterial studies indicated a patent right below knee popliteal to peroneal bypass She had an MRI in February 2008 that was negative frosty myelitis but this showed general soft tissue edema in the right foot and lower extremity concerning for cellulitis She is a diabetic, managed with insulin. Her hemoglobin A1c in December 2017 was 8.4 which is a trend up from previous levels. She had blood work in February 2018 which revealed an albumin of 2.6 this appears to be relatively acute as an albumin in November 2017 was 3.7 11/13/16; this is a patient I have not seen since February who is readmitted to our clinic last week. 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 Bridgett Larsson. I have reviewed Dr. Lianne Moris notes from March/23/18. Doppler ABI on that date showed an ABI on the right of 0.48 and on  the left of 0.63. Dorsalis pedis waveforms were monophasic bilaterally. There was no waveforms detected at the posterior tibial on the right, monophasic on the left. Dr. Lianne Moris comments were that this patient would have follow-up vascular studies in 3 months including ABIs and right lower Joiner, Orah V. ([id number]) extremity arterial duplex. She had an MRI in February that was negative for osteomyelitis but showed generalized edema in the foot. Last albumin I see was in January at 3.4 we have been using Santyl to the 4 wounds on the right leg the patient is noted today to have widespread edema well up towards her groin this is pitting 2-3+. 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 Normal ejection fraction. 11/20/16; patient has a follow-up appointment with Dr. Bridgett Larsson on April 23. She is still complaining of a lot of pain in the right foot and right leg. It is not clear to me that this is at all positional however I think it is clear claudication with minimal activity perhaps at rest. At our suggestion she is return to her primary physician's office tomorrow with regards to her pitting lower extremity bilateral edema that I reviewed in detail last week 11/27/16; the follow-up with Dr. Bridgett Larsson was actually on May 23 on April 23 as I stated in my note last week. N/A case all of her wounds seems somewhat smaller. The 2 on the right leg are definitely smaller. The areas on the dorsal right first toe, right third toe and the lateral part of the right fifth metatarsal head all looks smaller but have tightly adherent surfaces. We have been using Santyl 12/04/16; follow-up with Dr. Bridgett Larsson on May 23. 2 small open wounds on the right leg continue to get smaller. The area on the right third total lateral aspect of the right fifth toe also look better. The remaining area on the dorsal first toe still has some depth to it. We have been using Santyl to the toes and collagen on the right 12/11/16; according to patient's husband the follow-up with Dr. Bridgett Larsson is not until July. All of the wounds on the right leg are measuring smaller. We have been using some combination of Prisma and Santyl although I think we can go to straight Prisma today. There may have been some confusion with home health about the primary dressing orders here. 12/25/16; the patient has had some healing this week. The area on her right lateral fifth metatarsal head, right third toe are both healed lower right leg is healed. In the vein harvest site superiorly she has one superficial open area. On the dorsal aspect of her right great toe/MTP joint the wound is now divided into 2 however the  proximal area is deep and there is palpable bone 01/01/17; still an open area in the middle of her original right surgical scar. The area on the right third toe and right lateral fifth metatarsal head remained closed. Problematic area on the dorsal aspect of the toe. Previous surgery in this area line 01/08/17 small open area on the original scar on her upper anterior leg although this is closing. X-ray I did of the right first toe did not show underlying bony abnormality. Still this area on the dorsal first toe probes to bone. We have been using Prisma 01/15/17; small open area in the original scar in the upper anterior leg is almost fully closed. She has 2 open areas over the dorsal aspect of the right first  toe that probes to bone. Used and a form starting last week. Vigorous bone scraping that I did last week showed few methicillin sensitive staph aureus. She is allergic to penicillin and sulfa drugs. I'm going to give her 2 weeks of doxycycline. She will need an MRI with contrast. 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 She continues on doxycycline for a bone scraping that showed methicillin sensitive staph aureus [allergic to penicillin and sulfa]. We have been using Endo form to the wound. 01/29/17; surprisingly her MRI did not show osteomyelitis. She continues on doxycycline for a bone scraping that showed methicillin sensitive staph aureus with allergies to penicillin and sulfa. We have been using Endo form to the wound. Unfortunately I cannot get a surface on this visit looks like it is able to support a healing state. Proximally there is still exposed bone. There is no overt soft tissue tenderness. Her MRI did show a previous fracture was surgery to this area but no hardware 02/12/17 on evaluation today patient appears to be doing well in regard to her lower extremity wound. She does have some mild discomfort but  this is minimal. There's no evidence of infection. Her left great toe nail has somewhat been lifted up and there is a little bit of slight bleeding underneath but this is still firmly attached. 02/19/17; she ran out of doxycycline 2 days ago. Nevertheless I would like to continue this for 2 weeks to make a full 6 weeks of therapy as the bone scraping that I did from the open area on the dorsal right first toe showed MRSA. This should complete antibiotic therapy. 02/26/17; the patient is completing her 6 weeks of oral doxycycline. Deep wound on the dorsal right toe. This still has some exposed bone proximally. She does have a reasonably granulated surface albeit thin over bone. I've been using Endo form have made application for an amniotic skin sub 03/05/17; the patient has completed 6 weeks of doxycycline. This is a deep wound on the dorsal right toe which has exposed bone proximally. She has granulation over most of this wound albeit a thin layer. I've been using Endo form. Still do not have approval for [employer] 03/13/17 on evaluation today patient's right foot wound appears to be doing better measurement wise compared to her last evaluation. She has been tolerating the dressing change without complication and does have a appointment with the dietary that has been made as far as referral is concerned there just waiting for her and transportation to contact them back for an actual date. Nonetheless patient has been having less pain at this point. No fevers, chills, nausea, or vomiting noted at this time. 03/26/17; linear wound over the dorsal aspect of her right great toe. At one point this had exposed bone on the most superior aspect however I am pleased to see today that this appears to have a surface of granulation. Apparently product was applied for but denied by [facility]. We'll need to see what that was. The patient has known PAD status post revascularization. MRI did not show osteomyelitis which  was done in June Emminger, Tajah V. ([id number]) 04/02/17; continued improvement using Endoform. She was approved for [employer] but hasn't over $326 co-pay per application, this is beyond her means 04/09/17; continues on Endoform. 04/16/17; appears to be doing nicely continuing on Endoform 04/22/17; patient did not have her dressing changed all week because  of the weather. Using Endoform. Base of the wound looks healthy elbow there appears to events surrounding maceration perhaps because of the drainage was not changing the wound dressing 04/30/17 wound today continues to close and except for a small divot on the superior part of the wound. This area did not probe the bone but I found this a little concerning as this was the area with exposed bone before we be able to get this to granulate forward. As I remember things she is not a candidate for skin substitutes secondary to an outlandish co-pay. I asked her husband to look into the out of pocket max for medications if he has 1 [Regranex] or totally for skin substitutes 05/07/17; the patient arrives today with a wound roughly the same size although under careful inspection under the light there appears to be some epithelialization. Her intake nurse noted that the Endoform seemed to be placed over the wound rather than in the deeper divots they could really benefit from the Endoform. At this point I have no plans to change the Endoform as at one point this was at least 33% exposed bone and the rest of the wound very close to the underlying phalanx 05/14/17; no major change in the size or appearance of the wound. We have been using Endoform for a prolonged period of time with reasonably good improvement in the epithelialization i.e. no exposed bone especially proximally. Unless we've not made a lot of changes in the last several weeks. She does not complain of pain. She was revascularized early this year or perhaps late last year by Dr. Bridgett Larsson and I wonder if he  will need to have a look at her, I will need to review the actual vascular procedure which I think was a distal popliteal bypass 05/21/17; switched to either for a blue last week. Patient arrived in clinic with the wound not measuring any better but the surface looking some better. Unfortunately she had some surface slough and when I went to remove this superiorly she had exposed bone. Previously she had had exposed bone in the inferior part of the wound however this is granulated over some weeks ago. Clearly this is a major step back for her. With some difficulty I was able to obtain a piece of the bone probing through the superior part of the wound for culture. We did not send pathology. It is likely that she is going to need imaging of this site but I did not order this today 05/28/17; bone culture I took last week showed MSSA. She still has exposed bone however I could not get another piece for pathology. She had an MRI 4 months ago that did not show osteoarthritic at time. Wound rapidly deteriorated superiorly and I suspect this is underlying osteomyelitis. We have been using Hydrofera Blue 06/04/17 on evaluation today patient's wound appears to actually be doing a little bit better visually compared to last week's evaluation which is good news. Nonetheless she does have osteomyelitis of the toe which does not appear to be MRSA. No fevers, chills, nausea, or vomiting noted at this time. She is having no discomfort. 06/11/17; patient's wound looks a little healthier than I remember seeing it 2 weeks ago. There is no exposed bone and the surface of the wound appears well granulated. We've been using hydrofera blue. I note that she was started on doxycycline which was MSSA. Her appointment with Dr. Ola Spurr is on November 12 I believe. She has bone documenting MSSA osteomyelitis  06/18/17; patient saw Dr. Vallarie Mare of vascular surgery on 11/9. He offered right leg angiography possible intervention  however the patient refused. I've discussed this with the patient's husband today she did not want any further procedures. Also noteworthy that Dr. Vallarie Mare stated that this wound had not healed in 3 years, I was not aware of this degree of chronicity in this area. She has underlying osteomyelitis by bone culture. Culture of this grew MSSA, she is allergic to penicillin and therefore not a candidate for beta lactams. She saw Dr. Ola Spurr of infectious disease this morning, he continued her on doxycycline for another month and apparently sent in a prescription. We have been using Hydrofera Blue. ABI's update by Dr. Bridgett Larsson right 0.62, left 0.89. Monophasic wave forms 06/25/17 on evaluation today patient appears to be doing well in regard to her right great toe wound. She is still taking the doxycycline as prescribed by Dr. Ola Spurr. The Hydrofera Blue Dressing's to be doing as well as anything has for her up to this point and we are still waiting on an insurance authorization for the [place]. She is having no pain which is good news. No fevers, chills, nausea, or vomiting noted at this time. 07/02/17; still taking doxycycline as prescribed by Dr. Ola Spurr. Hydrofera Blue continues. We have no palpable bone. Still have not had had approval of Apligraf 08/06/16; the patient arrives today with [facility] even though we apparently had changed to Sorbact last time. Her co-pay for Regranex is $389 in discussion with the patient and her husband this was excessive. We'll continue with the Sorbact and standard dressings for now 08/20/17; we have been using sorbact. The wound bed still requires debridement. Wound measurements are slightly better. 09/03/17;using Sorbact.no debridement today. Wound measurements slightly better. There is some discoloration of the tip of her toelooks like bruising 09/17/17; using Sorbact. No debridement today. Wound dimensions are about the same. Still some discoloration at the  tip of her great toe. This does not look any worse than last time Matarazzo, Janai V. ([id number]) 10/01/17; using sorbact right great toe I thought she might have surface epithelialization last time although it is certainly not look like that today nevertheless her dimensions are better. Continued surface eschar on the tip of her toe but this is not progressing. She is actually complaining of the left great toe 10/15/17; this is a very difficult area on the dorsal aspect of her proximal right great toe. At one point this wound had exposed bone however we have managed to get some degree of epithelialization. She was revascularized by Dr. Vallarie Mare in [place]. She saw Dr. Ola Spurr for underlying osteomyelitis. She is not a candidate for advanced treatment options [insurance issues]. She comes in today with a new area on the tip of her right great toe. The exact history here is unclear. We have been using sorbact 10/29/17; patient arrives today with very significant bilateral increase edema which appears to extend into the thighs. This is tight and not particularly pitting however it looks a lot more impressive than I remember. Not surprisingly the wounds on her right great toe have increased in size with weeping edema fluid as the edema extends into the dorsal foot itself. She also has a wound on the tip of her right great toe which is a new wound from 2 weeks ago. We have been using sorbact. 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 He is on  Demadex presumably twice a day at 20 mg. I have asked her family to make her an appointment with her primary physicians at the [facility] to go over this degree of edema. This is causing I think deterioration in the right great toe wound. She also has significant PAD and is status post right leg bypass graft. I think this was done by [facility]. I believe they also  said that there was nothing further that could be done with regards to her vascular status. 11/12/17; patient is going to see her primary doctor this afternoon with regards to lower extremity edema. She has 2 open wounds on the right great toe the original wound on the dorsal proximal part and then a more necrotic looking area on the tip of her right great toe. We took some time today to review her vascular status. The patient had a relate below knee popliteal to peroneal artery bypass with reversed greater saphenous vein on 06/13/17; she also had endarterectomy of the midsegment of the peroneal artery. 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 I think at some point in time after that she was offered an arteriogram on the right but she declined. I've used Silver collagen to her wounds recently. 11/26/17; patient has 2 open wounds on the right great toe tip as well as the dorsal aspect of her right great toe. She has known PAD. I've been trying to get her back to see vein and vascular in [facility] to see if there are options for endovascular surgery to help with perfusion although the patient and her husband may not want any more surgery. We've been using silver alginate because of maceration. She does not have an option for advanced treatment/skin substitutes 12/10/17; continued open wounds much the same as 2 weeks ago on the dorsal aspect of the right great toe on the right great toe tip. I've encouraged to follow-up with Dr. Bridgett Larsson of vein and vascular to explore the possibility that there may be correctable ischemia involved in nonhealing these areas. We made considerable progress on the dorsal right great toe however it is stalled recently. We do not have an advanced treatment option. Originally this had exposed bone however there is a granulated base. 12/24/17;  patient went back to see vein and vascular in [place]. She was seen by Manus Gunning NP. ABIs on the right were noted to be 0.84 and TBI of the right and 0.62. On the left her ABI was 0.82 and her TBI of 0.75. Waveforms were monophasic at the posterior tibial and dorsalis pedis bilaterally. 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 Her husband reiterates today that she does not want an arteriogram therefore I'm not going to press the issue. We've been using silver collagen to the wounds on the tip of the right great toe and dorsal surface of the right great toe. 01/07/18; patient arrives today with the tip of her toe healed. An cerebral amount of slough over the wound on the dorsal toe. As noted on 12/24/17 no options currently for revascularization the patient will not allow an arteriogram 01/21/18; typical of her right great toe remains healed. Still large amount of slough over the wound on  the dorsal toe. No options for revascularization. We've been using Medihoney 02/04/18; no major change. Still a punched out area on the right great toe dorsal wound. Covered in a difficult necrotic debris. 02/18/18; no major change. Still a punched out area over the right great toe. They're using Otho 03/04/18; no major change. The wound is stalled. Still a punched out area over the right dorsal great toe. We've been using medihone for about a month not much change in the wound dimensions or the characteristics of the wound surface. We've previously used Iodoflex, Hydrofera Blue, sorbact, and I think at one point [place]. She has PAD and does not have any revascularization options although a wound distally on the tip of the great toe did close over 03/24/18; absolutely no change. Still adherent debris over the wound surface there is no  palpable bone but the wound still has some depth. This is predominantly a arterial wound and she basically has refused further attempts at revascularization. I would consider this a totally palliative situation except for she managed to heal the wound at the tip of the same toe some Ralphs, Waynette V. ([id number]) months ago. I changed her back to Iodoflex The other issue is that [facility] called and wants to pare back on the visits which have been 3 times a week. They will drop to 2 times a week next week Objective Constitutional Sitting or standing Blood Pressure is within target range for patient.. Pulse regular and within target range for patient.[name] Respirations regular, non-labored and within target range.. Temperature is normal and within the target range for the patient.[name] appears in no distress. Vitals Time Taken: 10:25 AM, Height: 64 in, Weight: 200 lbs, BMI: 34.3, Temperature: 97.9 F, Pulse: 79 bpm, Respiratory Rate: 18 breaths/min, Blood Pressure: 143/57 mmHg. General Notes: when exam; the original wounds on the dorsal aspect of the right great toe. Punched out area that at one time was down to bone however there is no palpable bone currently. I think we made some progress in filling this wound in with Endoform although the wound is stalled and really has been unchanged for many months. There is no evidence of infection Integumentary (Hair, Skin) Wound #7 status is Open. Original cause of wound was Gradually Appeared. The wound is located on the Right,Dorsal Metatarsal head first. The wound measures 2.8cm length x 1cm width x 0.3cm depth; 2.199cm^2 area and 0.66cm^3 volume. There is no tunneling or undermining noted. There is a large amount of serous drainage noted. The wound margin is distinct with the outline attached to the wound base. There is small (1-33%) pink granulation within the wound bed. There is a large (67-100%) amount of necrotic tissue within the wound bed  including Adherent Slough. The periwound skin appearance exhibited: Maceration. Periwound temperature was noted as No Abnormality. The periwound has tenderness on palpation. 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 ([id number]) Wound Cleansing: Wound #7 Right,Dorsal Metatarsal head first: Clean wound with Normal Saline. Anesthetic (add to Medication List): Wound #7 Right,Dorsal Metatarsal head first: Topical Lidocaine 4% cream applied to wound bed prior to debridement (In Clinic Only). Primary Wound Dressing: Wound #7 Right,Dorsal Metatarsal head first: Iodoflex Secondary  Dressing: Wound #7 Right,Dorsal Metatarsal head first: ABD and Kerlix/Conform Dressing Change Frequency: Wound #7 Right,Dorsal Metatarsal head first: Change Dressing Monday, Wednesday, Friday Follow-up Appointments: Wound #7 Right,Dorsal Metatarsal head first: Return Appointment in 3 weeks. Edema Control: Wound #7 Right,Dorsal Metatarsal head first: Elevate legs to the level of the heart and pump ankles as often as possible Additional Orders / Instructions: Wound #7 Right,Dorsal Metatarsal head first: Increase protein intake. Activity as tolerated Home Health: Wound #7 Right,Dorsal Metatarsal head first: [place] [facility] may decrease patient's visit frequency to 2 times weekly - please notify [facility] if visits will be decreased to 1 time weekly [place] [facility] twice weekly, [facility] Home Health Nurse may visit [facility] to address patient s wound care needs. Home Health Nurse may visit [facility] to address  patient s wound care needs. FACE TO FACE ENCOUNTER: MEDICARE and MEDICAID PATIENTS: I certify that this patient is under my care and that I had a face-to-face encounter that meets the physician face-to-face encounter requirements with this patient on this date. The encounter with the patient was in whole or in part for the following MEDICAL CONDITION: (primary reason for [place]) MEDICAL NECESSITY: I certify, that based on my findings, NURSING services are a medically necessary home health service. HOME BOUND STATUS: I certify that my clinical findings support that this patient is homebound (i.e., Due to illness or injury, pt requires aid of supportive devices such as crutches, cane, wheelchairs, walkers, the use of special transportation or the assistance of another person to leave their place of residence. There is a normal inability to leave the home and doing so requires considerable and taxing effort. Other absences are for medical reasons / religious services and are infrequent or of short duration when for other reasons). FACE TO FACE ENCOUNTER: MEDICARE and MEDICAID PATIENTS: I certify that this patient is under my care and that I had a face-to-face encounter that meets the physician face-to-face encounter requirements with this patient on this date. The encounter with the patient was in whole or in part for the following MEDICAL CONDITION: (primary reason for [place]) MEDICAL NECESSITY: I certify, that based on my findings, NURSING services are a medically necessary home health service. HOME BOUND STATUS: I certify that my clinical findings support that this patient is homebound (i.e., Due to illness or injury, pt requires aid of supportive devices such as crutches, cane, wheelchairs, walkers, the use of special transportation or the assistance of another person to leave their place of residence. There is a normal inability to leave the home and doing so requires  considerable and taxing effort. Other absences are for medical reasons / religious services and are infrequent or of short duration when for other reasons). If current dressing causes regression in wound condition, may D/C ordered dressing product/s and apply Normal Saline Moist Dressing daily until next [place] / Other MD appointment. [place] of regression in wound condition at [phone]. If current dressing causes regression in wound condition, may D/C ordered dressing product/s and apply Normal Saline Moist Dressing daily until next [place] / Other MD appointment. [place] of regression in wound condition at [phone]. Please direct any NON-WOUND related issues/requests for orders to patient's Primary Care Physician Please direct any NON-WOUND related issues/requests for orders to patient's Primary Care Physician Medical Decision Making [name], EMRIE GAYLE. ([id number]) Non-pressure chronic ulcer of other part of right  foot with unspecified severity 11/06/2016 Status: No change Complications: still adherent surface debris. Interventions: change to iodoflex 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 of foot 11/06/2016 Status: No change Complications: she does not have an option for revascularization that she is elected to pursue. She was offered an arteriogram but refused it. Interventions: at this point no additional surgical options are present #1 I change the primary dressing to Iodoflex to see if we can get some additional debridement and 1 #2 once again we cocked about advanced treatment options although as I remember things the patient has a roughly $229 per application co-pay Electronic Signature(s) Signed: 03/25/2018 5:35:06 PM By: Linton Ham MD Entered By: Linton Ham on 03/25/2018 12:27:21 Ettinger, Chavon [name]  ([id number]) -------------------------------------------------------------------------------- [place] Details Patient Name: Tripp, Happy V. Date of Service: 03/25/2018 Medical Record Number: [id number] Patient Account Number: [id number] Date of Birth/Sex: February 11, 1941 (77 y.o. 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 E11.52 Type 2 diabetes mellitus with diabetic peripheral angiopathy with gangrene I70.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 of foot Facility Procedures CPT4 Code: [id number] Description: [place] VISIT-LEV 3 EST PT Modifier: Quantity: 1 Physician Procedures CPT4 Code Description: [id number] - WC PHYS LEVEL 2 - EST PT ICD-10 Diagnosis Description L97.519 Non-pressure chronic ulcer of other part of right foot with unspe E11.622 Type 2 diabetes mellitus with other skin ulcer Modifier: cified severit Quantity: 1 y Engineer, maintenance) Signed: 03/25/2018 5:35:06 PM By: Linton Ham MD Entered By: Linton Ham on 03/25/2018 12:27:50

## 2018-04-10 ENCOUNTER — Emergency Department: Payer: Medicare HMO

## 2018-04-10 ENCOUNTER — Other Ambulatory Visit: Payer: Self-pay

## 2018-04-10 ENCOUNTER — Inpatient Hospital Stay
Admission: EM | Admit: 2018-04-10 | Discharge: 2018-04-17 | DRG: 689 | Disposition: A | Discharge status: Skilled Nursing Facility | Payer: Medicare HMO | Attending: Specialist | Admitting: Specialist

## 2018-04-10 ENCOUNTER — Inpatient Hospital Stay: Payer: Medicare HMO

## 2018-04-10 ENCOUNTER — Inpatient Hospital Stay
Admit: 2018-04-10 | Discharge: 2018-04-10 | Disposition: A | Discharge status: Home / Self Care | Payer: Medicare HMO | Attending: Internal Medicine | Admitting: Internal Medicine

## 2018-04-10 DIAGNOSIS — N3000 Acute cystitis without hematuria: Secondary | ICD-10-CM | POA: Diagnosis present

## 2018-04-10 DIAGNOSIS — B9620 Unspecified Escherichia coli [E. coli] as the cause of diseases classified elsewhere: Secondary | ICD-10-CM | POA: Diagnosis present

## 2018-04-10 DIAGNOSIS — N390 Urinary tract infection, site not specified: Secondary | ICD-10-CM | POA: Diagnosis present

## 2018-04-10 DIAGNOSIS — Z6838 Body mass index (BMI) 38.0-38.9, adult: Secondary | ICD-10-CM | POA: Diagnosis not present

## 2018-04-10 DIAGNOSIS — T148XXA Other injury of unspecified body region, initial encounter: Secondary | ICD-10-CM

## 2018-04-10 DIAGNOSIS — K219 Gastro-esophageal reflux disease without esophagitis: Secondary | ICD-10-CM | POA: Diagnosis present

## 2018-04-10 DIAGNOSIS — J449 Chronic obstructive pulmonary disease, unspecified: Secondary | ICD-10-CM | POA: Diagnosis present

## 2018-04-10 DIAGNOSIS — E7220 Disorder of urea cycle metabolism, unspecified: Secondary | ICD-10-CM | POA: Diagnosis present

## 2018-04-10 DIAGNOSIS — E1151 Type 2 diabetes mellitus with diabetic peripheral angiopathy without gangrene: Secondary | ICD-10-CM | POA: Diagnosis present

## 2018-04-10 DIAGNOSIS — I69351 Hemiplegia and hemiparesis following cerebral infarction affecting right dominant side: Secondary | ICD-10-CM

## 2018-04-10 DIAGNOSIS — E669 Obesity, unspecified: Secondary | ICD-10-CM | POA: Diagnosis present

## 2018-04-10 DIAGNOSIS — H9190 Unspecified hearing loss, unspecified ear: Secondary | ICD-10-CM | POA: Diagnosis present

## 2018-04-10 DIAGNOSIS — J9600 Acute respiratory failure, unspecified whether with hypoxia or hypercapnia: Secondary | ICD-10-CM

## 2018-04-10 DIAGNOSIS — E1140 Type 2 diabetes mellitus with diabetic neuropathy, unspecified: Secondary | ICD-10-CM | POA: Diagnosis present

## 2018-04-10 DIAGNOSIS — I110 Hypertensive heart disease with heart failure: Secondary | ICD-10-CM | POA: Diagnosis present

## 2018-04-10 DIAGNOSIS — L97519 Non-pressure chronic ulcer of other part of right foot with unspecified severity: Secondary | ICD-10-CM | POA: Diagnosis present

## 2018-04-10 DIAGNOSIS — Z880 Allergy status to penicillin: Secondary | ICD-10-CM | POA: Diagnosis not present

## 2018-04-10 DIAGNOSIS — G4730 Sleep apnea, unspecified: Secondary | ICD-10-CM | POA: Diagnosis present

## 2018-04-10 DIAGNOSIS — E119 Type 2 diabetes mellitus without complications: Secondary | ICD-10-CM

## 2018-04-10 DIAGNOSIS — Z91013 Allergy to seafood: Secondary | ICD-10-CM

## 2018-04-10 DIAGNOSIS — I5031 Acute diastolic (congestive) heart failure: Secondary | ICD-10-CM | POA: Diagnosis present

## 2018-04-10 DIAGNOSIS — G9341 Metabolic encephalopathy: Secondary | ICD-10-CM | POA: Diagnosis present

## 2018-04-10 DIAGNOSIS — I350 Nonrheumatic aortic (valve) stenosis: Secondary | ICD-10-CM | POA: Diagnosis present

## 2018-04-10 DIAGNOSIS — Z823 Family history of stroke: Secondary | ICD-10-CM

## 2018-04-10 DIAGNOSIS — R1310 Dysphagia, unspecified: Secondary | ICD-10-CM | POA: Diagnosis present

## 2018-04-10 DIAGNOSIS — E860 Dehydration: Secondary | ICD-10-CM | POA: Diagnosis present

## 2018-04-10 DIAGNOSIS — E785 Hyperlipidemia, unspecified: Secondary | ICD-10-CM | POA: Diagnosis present

## 2018-04-10 DIAGNOSIS — J9601 Acute respiratory failure with hypoxia: Secondary | ICD-10-CM | POA: Diagnosis not present

## 2018-04-10 DIAGNOSIS — I509 Heart failure, unspecified: Secondary | ICD-10-CM

## 2018-04-10 DIAGNOSIS — E1165 Type 2 diabetes mellitus with hyperglycemia: Secondary | ICD-10-CM | POA: Diagnosis present

## 2018-04-10 DIAGNOSIS — Z8249 Family history of ischemic heart disease and other diseases of the circulatory system: Secondary | ICD-10-CM

## 2018-04-10 DIAGNOSIS — J9602 Acute respiratory failure with hypercapnia: Secondary | ICD-10-CM | POA: Diagnosis not present

## 2018-04-10 DIAGNOSIS — Z833 Family history of diabetes mellitus: Secondary | ICD-10-CM

## 2018-04-10 DIAGNOSIS — E11621 Type 2 diabetes mellitus with foot ulcer: Secondary | ICD-10-CM | POA: Diagnosis present

## 2018-04-10 LAB — URINALYSIS, COMPLETE (UACMP) WITH MICROSCOPIC
BACTERIA UA: NONE SEEN
BILIRUBIN URINE: NEGATIVE
Glucose, UA: NEGATIVE mg/dL
KETONES UR: NEGATIVE mg/dL
NITRITE: NEGATIVE
PROTEIN: NEGATIVE mg/dL
SQUAMOUS EPITHELIAL / LPF: NONE SEEN (ref 0–5)
Specific Gravity, Urine: 1.009 (ref 1.005–1.030)
pH: 9 — ABNORMAL HIGH (ref 5.0–8.0)

## 2018-04-10 LAB — CBC WITH DIFFERENTIAL/PLATELET
BASOS PCT: 0 %
Basophils Absolute: 0 10*3/uL (ref 0–0.1)
EOS ABS: 0 10*3/uL (ref 0–0.7)
Eosinophils Relative: 0 %
HEMATOCRIT: 42.5 % (ref 35.0–47.0)
Hemoglobin: 13.9 g/dL (ref 12.0–16.0)
LYMPHS ABS: 0.3 10*3/uL — AB (ref 1.0–3.6)
Lymphocytes Relative: 2 %
MCH: 27.5 pg (ref 26.0–34.0)
MCHC: 32.8 g/dL (ref 32.0–36.0)
MCV: 83.9 fL (ref 80.0–100.0)
MONOS PCT: 5 %
Monocytes Absolute: 0.7 10*3/uL (ref 0.2–0.9)
NEUTROS ABS: 14.3 10*3/uL — AB (ref 1.4–6.5)
Neutrophils Relative %: 93 %
Platelets: 168 10*3/uL (ref 150–440)
RBC: 5.07 MIL/uL (ref 3.80–5.20)
RDW: 17 % — ABNORMAL HIGH (ref 11.5–14.5)
WBC: 15.3 10*3/uL — AB (ref 3.6–11.0)

## 2018-04-10 LAB — SEDIMENTATION RATE: SED RATE: 13 mm/h (ref 0–30)

## 2018-04-10 LAB — COMPREHENSIVE METABOLIC PANEL
ALT: 22 U/L (ref 0–44)
ANION GAP: 9 (ref 5–15)
AST: 22 U/L (ref 15–41)
Albumin: 3.7 g/dL (ref 3.5–5.0)
Alkaline Phosphatase: 76 U/L (ref 38–126)
BUN: 13 mg/dL (ref 8–23)
CALCIUM: 9 mg/dL (ref 8.9–10.3)
CHLORIDE: 100 mmol/L (ref 98–111)
CO2: 31 mmol/L (ref 22–32)
CREATININE: 0.9 mg/dL (ref 0.44–1.00)
Glucose, Bld: 167 mg/dL — ABNORMAL HIGH (ref 70–99)
Potassium: 3.7 mmol/L (ref 3.5–5.1)
SODIUM: 140 mmol/L (ref 135–145)
Total Bilirubin: 1.1 mg/dL (ref 0.3–1.2)
Total Protein: 6.9 g/dL (ref 6.5–8.1)

## 2018-04-10 LAB — GLUCOSE, CAPILLARY
Glucose-Capillary: 117 mg/dL — ABNORMAL HIGH (ref 70–99)
Glucose-Capillary: 178 mg/dL — ABNORMAL HIGH (ref 70–99)

## 2018-04-10 MED ORDER — ALBUTEROL SULFATE (2.5 MG/3ML) 0.083% IN NEBU: 3.0000 mL | INHALATION_SOLUTION | Freq: Four times a day (QID) | RESPIRATORY_TRACT | Status: DC | PRN

## 2018-04-10 MED ORDER — CARVEDILOL 6.25 MG PO TABS
6.2500 mg | ORAL_TABLET | Freq: Two times a day (BID) | ORAL | Status: DC
  Administered 2018-04-10 – 2018-04-17 (×13): 6.25 mg via ORAL
  Filled 2018-04-10 (×3): qty 1
  Filled 2018-04-10 (×3): qty 2
  Filled 2018-04-10 (×3): qty 1
  Filled 2018-04-10: qty 2
  Filled 2018-04-10 (×9): qty 1

## 2018-04-10 MED ORDER — SODIUM CHLORIDE 0.9 % IV BOLUS
500.0000 mL | Freq: Once | INTRAVENOUS | Status: AC
  Administered 2018-04-10: 500 mL via INTRAVENOUS

## 2018-04-10 MED ORDER — VANCOMYCIN HCL IN DEXTROSE 1-5 GM/200ML-% IV SOLN: 1000.0000 mg | Freq: Once | INTRAVENOUS | Status: DC

## 2018-04-10 MED ORDER — ONDANSETRON HCL 4 MG PO TABS: 4.0000 mg | ORAL_TABLET | Freq: Four times a day (QID) | ORAL | Status: DC | PRN

## 2018-04-10 MED ORDER — FLUTICASONE PROPIONATE 50 MCG/ACT NA SUSP
1.0000 | Freq: Every day | NASAL | Status: DC | PRN
  Filled 2018-04-10: qty 16

## 2018-04-10 MED ORDER — POLYETHYLENE GLYCOL 3350 17 G PO PACK
17.0000 g | PACK | Freq: Every day | ORAL | Status: DC | PRN
  Administered 2018-04-16: 17 g via ORAL
  Filled 2018-04-10: qty 1

## 2018-04-10 MED ORDER — ISOSORBIDE DINITRATE 30 MG PO TABS
30.0000 mg | ORAL_TABLET | Freq: Every day | ORAL | Status: DC
  Administered 2018-04-11 – 2018-04-17 (×6): 30 mg via ORAL
  Filled 2018-04-10 (×8): qty 1

## 2018-04-10 MED ORDER — SODIUM CHLORIDE 0.9 % IV SOLN
1.0000 g | INTRAVENOUS | Status: DC
  Administered 2018-04-11 – 2018-04-16 (×6): 1 g via INTRAVENOUS
  Filled 2018-04-10 (×2): qty 1
  Filled 2018-04-10: qty 10
  Filled 2018-04-10: qty 1
  Filled 2018-04-10: qty 10
  Filled 2018-04-10: qty 1

## 2018-04-10 MED ORDER — FUROSEMIDE 10 MG/ML IJ SOLN: 40.0000 mg | Freq: Every day | INTRAMUSCULAR | Status: DC

## 2018-04-10 MED ORDER — FUROSEMIDE 10 MG/ML IJ SOLN
40.0000 mg | Freq: Two times a day (BID) | INTRAMUSCULAR | Status: DC
  Administered 2018-04-10 – 2018-04-12 (×4): 40 mg via INTRAVENOUS
  Filled 2018-04-10 (×4): qty 4

## 2018-04-10 MED ORDER — VANCOMYCIN HCL IN DEXTROSE 1-5 GM/200ML-% IV SOLN
1000.0000 mg | INTRAVENOUS | Status: DC
  Administered 2018-04-11 – 2018-04-12 (×3): 1000 mg via INTRAVENOUS
  Filled 2018-04-10 (×5): qty 200

## 2018-04-10 MED ORDER — LORATADINE 10 MG PO TABS
10.0000 mg | ORAL_TABLET | Freq: Every day | ORAL | Status: DC
  Administered 2018-04-10 – 2018-04-17 (×7): 10 mg via ORAL
  Filled 2018-04-10 (×8): qty 1

## 2018-04-10 MED ORDER — ACETAMINOPHEN 500 MG PO TABS: 1000.0000 mg | ORAL_TABLET | Freq: Four times a day (QID) | ORAL | Status: DC | PRN

## 2018-04-10 MED ORDER — POTASSIUM CHLORIDE CRYS ER 20 MEQ PO TBCR
20.0000 meq | EXTENDED_RELEASE_TABLET | Freq: Every day | ORAL | Status: DC
  Administered 2018-04-10 – 2018-04-17 (×7): 20 meq via ORAL
  Filled 2018-04-10 (×7): qty 1

## 2018-04-10 MED ORDER — INSULIN GLARGINE 100 UNIT/ML ~~LOC~~ SOLN
20.0000 [IU] | Freq: Every day | SUBCUTANEOUS | Status: DC
  Administered 2018-04-10 – 2018-04-14 (×5): 20 [IU] via SUBCUTANEOUS
  Filled 2018-04-10 (×8): qty 0.2

## 2018-04-10 MED ORDER — AMLODIPINE BESYLATE 10 MG PO TABS
10.0000 mg | ORAL_TABLET | Freq: Every day | ORAL | Status: DC
  Administered 2018-04-10 – 2018-04-17 (×7): 10 mg via ORAL
  Filled 2018-04-10: qty 1
  Filled 2018-04-10: qty 2
  Filled 2018-04-10 (×5): qty 1
  Filled 2018-04-10: qty 2

## 2018-04-10 MED ORDER — ACETAMINOPHEN 650 MG RE SUPP: 650.0000 mg | Freq: Four times a day (QID) | RECTAL | Status: DC | PRN

## 2018-04-10 MED ORDER — DOCUSATE SODIUM 100 MG PO CAPS
100.0000 mg | ORAL_CAPSULE | Freq: Two times a day (BID) | ORAL | Status: DC
  Administered 2018-04-10 – 2018-04-17 (×10): 100 mg via ORAL
  Filled 2018-04-10 (×12): qty 1

## 2018-04-10 MED ORDER — VITAMIN D 1000 UNITS PO TABS
2000.0000 [IU] | ORAL_TABLET | Freq: Every day | ORAL | Status: DC
  Administered 2018-04-10 – 2018-04-17 (×7): 2000 [IU] via ORAL
  Filled 2018-04-10 (×8): qty 2

## 2018-04-10 MED ORDER — INSULIN ASPART 100 UNIT/ML ~~LOC~~ SOLN
0.0000 [IU] | Freq: Every day | SUBCUTANEOUS | Status: DC
  Administered 2018-04-11: 21:00:00 3 [IU] via SUBCUTANEOUS
  Administered 2018-04-12: 2 [IU] via SUBCUTANEOUS
  Administered 2018-04-13: 3 [IU] via SUBCUTANEOUS
  Administered 2018-04-14: 5 [IU] via SUBCUTANEOUS
  Administered 2018-04-15: 2 [IU] via SUBCUTANEOUS
  Administered 2018-04-16: 4 [IU] via SUBCUTANEOUS
  Filled 2018-04-10 (×6): qty 1

## 2018-04-10 MED ORDER — METOPROLOL SUCCINATE ER 50 MG PO TB24
50.0000 mg | ORAL_TABLET | Freq: Every day | ORAL | Status: DC
  Administered 2018-04-10 – 2018-04-14 (×4): 50 mg via ORAL
  Filled 2018-04-10 (×4): qty 1

## 2018-04-10 MED ORDER — ONDANSETRON HCL 4 MG/2ML IJ SOLN
4.0000 mg | Freq: Four times a day (QID) | INTRAMUSCULAR | Status: DC | PRN
  Administered 2018-04-12: 4 mg via INTRAVENOUS
  Filled 2018-04-10: qty 2

## 2018-04-10 MED ORDER — ASPIRIN EC 325 MG PO TBEC
325.0000 mg | DELAYED_RELEASE_TABLET | Freq: Every day | ORAL | Status: DC
  Administered 2018-04-10 – 2018-04-17 (×7): 325 mg via ORAL
  Filled 2018-04-10 (×7): qty 1

## 2018-04-10 MED ORDER — ENOXAPARIN SODIUM 40 MG/0.4ML ~~LOC~~ SOLN
40.0000 mg | SUBCUTANEOUS | Status: DC
  Administered 2018-04-10 – 2018-04-16 (×7): 40 mg via SUBCUTANEOUS
  Filled 2018-04-10 (×7): qty 0.4

## 2018-04-10 MED ORDER — VANCOMYCIN HCL 10 G IV SOLR
1250.0000 mg | Freq: Once | INTRAVENOUS | Status: AC
  Administered 2018-04-10: 1250 mg via INTRAVENOUS
  Filled 2018-04-10: qty 1250

## 2018-04-10 MED ORDER — INSULIN ASPART 100 UNIT/ML ~~LOC~~ SOLN
0.0000 [IU] | Freq: Three times a day (TID) | SUBCUTANEOUS | Status: DC
  Administered 2018-04-11: 12:00:00 2 [IU] via SUBCUTANEOUS
  Administered 2018-04-11: 17:00:00 5 [IU] via SUBCUTANEOUS
  Administered 2018-04-11: 09:00:00 1 [IU] via SUBCUTANEOUS
  Administered 2018-04-12 – 2018-04-13 (×4): 3 [IU] via SUBCUTANEOUS
  Administered 2018-04-13: 2 [IU] via SUBCUTANEOUS
  Administered 2018-04-13: 3 [IU] via SUBCUTANEOUS
  Administered 2018-04-14 – 2018-04-15 (×4): 5 [IU] via SUBCUTANEOUS
  Administered 2018-04-15: 7 [IU] via SUBCUTANEOUS
  Administered 2018-04-15: 5 [IU] via SUBCUTANEOUS
  Administered 2018-04-16: 2 [IU] via SUBCUTANEOUS
  Administered 2018-04-16: 7 [IU] via SUBCUTANEOUS
  Administered 2018-04-16: 3 [IU] via SUBCUTANEOUS
  Administered 2018-04-17: 2 [IU] via SUBCUTANEOUS
  Administered 2018-04-17: 5 [IU] via SUBCUTANEOUS
  Filled 2018-04-10 (×19): qty 1

## 2018-04-10 MED ORDER — NITROGLYCERIN 0.4 MG SL SUBL: 0.4000 mg | SUBLINGUAL_TABLET | SUBLINGUAL | Status: DC | PRN

## 2018-04-10 MED ORDER — SODIUM CHLORIDE 0.9 % IV SOLN
1.0000 g | Freq: Once | INTRAVENOUS | Status: AC
  Administered 2018-04-10: 1 g via INTRAVENOUS
  Filled 2018-04-10: qty 10

## 2018-04-10 MED ORDER — PREGABALIN 50 MG PO CAPS
50.0000 mg | ORAL_CAPSULE | Freq: Three times a day (TID) | ORAL | Status: DC
  Administered 2018-04-10 – 2018-04-12 (×5): 50 mg via ORAL
  Filled 2018-04-10 (×5): qty 1

## 2018-04-10 MED ORDER — PANTOPRAZOLE SODIUM 40 MG PO TBEC
40.0000 mg | DELAYED_RELEASE_TABLET | Freq: Every day | ORAL | Status: DC
  Administered 2018-04-10 – 2018-04-17 (×7): 40 mg via ORAL
  Filled 2018-04-10 (×7): qty 1

## 2018-04-10 MED ORDER — CLOPIDOGREL BISULFATE 75 MG PO TABS
75.0000 mg | ORAL_TABLET | Freq: Every day | ORAL | Status: DC
  Administered 2018-04-10 – 2018-04-17 (×7): 75 mg via ORAL
  Filled 2018-04-10 (×8): qty 1

## 2018-04-10 MED ORDER — ATORVASTATIN CALCIUM 20 MG PO TABS
20.0000 mg | ORAL_TABLET | Freq: Every day | ORAL | Status: DC
  Administered 2018-04-10 – 2018-04-16 (×7): 20 mg via ORAL
  Filled 2018-04-10 (×7): qty 1

## 2018-04-10 MED ORDER — LOSARTAN POTASSIUM 50 MG PO TABS
100.0000 mg | ORAL_TABLET | Freq: Every day | ORAL | Status: DC
  Administered 2018-04-10 – 2018-04-17 (×7): 100 mg via ORAL
  Filled 2018-04-10 (×7): qty 2

## 2018-04-10 MED ORDER — ACETAMINOPHEN 325 MG PO TABS
650.0000 mg | ORAL_TABLET | Freq: Four times a day (QID) | ORAL | Status: DC | PRN
  Administered 2018-04-11: 650 mg via ORAL
  Filled 2018-04-10: qty 2

## 2018-04-10 NOTE — H&P (Signed)
[facility] [place] NAME: [name]    MR#:  [id number]  DATE OF BIRTH:  March 20, 1941  DATE OF ADMISSION:  04/10/2018  PRIMARY CARE PHYSICIAN: [name], MD   REQUESTING/REFERRING PHYSICIAN: Dr Lavonia Drafts  CHIEF COMPLAINT:   Chief Complaint  Patient presents with  . Altered Mental Status    HISTORY OF PRESENT ILLNESS:  [name]  is a 77 y.o. female brought in by family for altered mental status.  Patient is not the best historian.  She complains of headache, foot pain stomach pain and shortness of breath.  Family states that she was altered this morning normally she can get up and walk with a walker but she was very weak and almost fell down.  They tried to get her pull up off but she had a bowel movement on the floor.  In the ER she was found to have a positive urine analysis and hospitalist services were contacted for further evaluation.  PAST MEDICAL HISTORY:   Past Medical History:  Diagnosis Date  . Acute heart failure ([place])   . Aortic stenosis   . Complication of anesthesia    Hard to wake patient up after having anesthesia  . Diabetes mellitus without complication ([place])   . Diabetic neuropathy ([facility])    Takes Lyrica  . Edema of both legs    Takes Lasix  . GERD (gastroesophageal reflux disease)   . High cholesterol   . HTN (hypertension)   . Hypertension   . PAD (peripheral artery disease) ([place])   . Shortness of breath dyspnea   . Spasm of back muscles   . Stroke ([place])   . Wound, open    Right great toe    PAST SURGICAL HISTORY:   Past Surgical History:  Procedure Laterality Date  . BYPASS GRAFT POPLITEAL TO TIBIAL Right 02/28/2016   Procedure: BYPASS GRAFT RIGHT BELOW KNEE POPLITEAL TO PERONEAL USING REVERSED RIGHT GREATER SAPHENOUS VEIN;  Surgeon: Conrad [place], MD;  Location: [facility];  Service: Vascular;  Laterality: Right;  . CYST EXCISION     Abdomen  . EYE SURGERY Bilateral    Cataract  removal  . INTRAMEDULLARY (IM) NAIL INTERTROCHANTERIC Left 02/04/2014   Procedure: INTRAMEDULLARY (IM) NAIL INTERTROCHANTRIC FEMORAL;  Surgeon: Mauri Pole, MD;  Location: [facility];  Service: Orthopedics;  Laterality: Left;  . IR GENERIC HISTORICAL  03/01/2016   IR ANGIO INTRA EXTRACRAN SEL COM CAROTID INNOMINATE UNI R MOD SED 03/01/2016 [name], MD [facility]  . IR GENERIC HISTORICAL  03/01/2016   IR ENDOVASC INTRACRANIAL INF OTHER THAN THROMBO ART INC DIAG ANGIO 03/01/2016 [name], MD [facility]  . IR GENERIC HISTORICAL  03/01/2016   IR INTRAVSC STENT CERV CAROTID W/O EMB-PROT MOD SED INC ANGIO 03/01/2016 [name], MD [facility]  . IR GENERIC HISTORICAL  06/03/2016   IR RADIOLOGIST EVAL & MGMT 06/03/2016 [facility]  . ORIF TOE FRACTURE Right 02/08/2014   Procedure: OPEN REDUCTION INTERNAL FIXATION Right METATARSAL  FRACTURE ;  Surgeon: Wylene Simmer, MD;  Location: [facility];  Service: Orthopedics;  Laterality: Right;  . PERIPHERAL VASCULAR CATHETERIZATION N/A 12/28/2015   Procedure: Abdominal Aortogram w/Lower Extremity;  Surgeon: Conrad Slinger, MD;  Location: [facility];  Service: Cardiovascular;  Laterality: N/A;  . RADIOLOGY WITH ANESTHESIA N/A 03/01/2016   Procedure: RADIOLOGY WITH ANESTHESIA;  Surgeon: [name], MD;  Location: [facility];  Service: Radiology;  Laterality: N/A;  .  VEIN HARVEST Right 02/28/2016   Procedure: RIGHT GREATER SAPHENOUS VEIN HARVEST;  Surgeon: Conrad Opdyke [place], MD;  Location: [facility];  Service: Vascular;  Laterality: Right;    SOCIAL HISTORY:   Social History   Tobacco Use  . Smoking status: Never Smoker  . Smokeless tobacco: Never Used  . Tobacco comment: Never smoked  Substance Use Topics  . Alcohol use: No    Alcohol/week: 0.0 standard drinks    FAMILY HISTORY:   Family History  Problem Relation Age of Onset  . Diabetes Other   . Liver disease Mother   . CVA Father   . Diabetes Father   . Hypertension Father      DRUG ALLERGIES:   Allergies  Allergen Reactions  . Iodine Anaphylaxis  . Penicillins Anaphylaxis    Rec'd cefazolin 02/04/14 pre-op for femur ORIF Has patient had a PCN reaction causing immediate rash, facial/tongue/throat swelling, SOB or lightheadedness with hypotension: Unknown Has patient had a PCN reaction causing severe rash involving mucus membranes or skin necrosis: Unknown Has patient had a PCN reaction that required hospitalization: Unknown Has patient had a PCN reaction occurring within the last 10 years: Unknown If all of the above answers are "NO", then may proceed with Cephalosporin u  . Shellfish Allergy Anaphylaxis  . Shellfish-Derived Products Anaphylaxis  . Sulfa Antibiotics Anaphylaxis  . Sulfacetamide Sodium Anaphylaxis  . Sulfasalazine Anaphylaxis  . Atorvastatin     Other reaction(s): Unknown  . Morphine And Related Other (See Comments)    Altered mental status    REVIEW OF SYSTEMS:  CONSTITUTIONAL: No fever.  Positive for headache.  Positive for dizziness.  Positive for weakness EYES: No blurred or double vision.  EARS, NOSE, AND THROAT: No tinnitus or ear pain. No sore throat RESPIRATORY: No cough.positive for shortness of breath, no wheezing or hemoptysis.  CARDIOVASCULAR: No chest pain.  Positive for edema.  GASTROINTESTINAL: No nausea, vomiting.  One episode of diarrhea or soft stool.  Some lower abdominal pain. No blood in bowel movements GENITOURINARY: No dysuria, hematuria.  ENDOCRINE: No polyuria, nocturia,  HEMATOLOGY: No anemia, easy bruising or bleeding SKIN: No rash or lesion. MUSCULOSKELETAL: Positive for right foot pain NEUROLOGIC: Positive for weakness PSYCHIATRY: No anxiety or depression.   MEDICATIONS AT HOME:   Prior to Admission medications   Medication Sig Start Date End Date Taking? Authorizing Provider  acetaminophen (TYLENOL) 500 MG tablet Take 1,000 mg by mouth every 6 (six) hours as needed for mild pain.   Yes [provider]  albuterol (PROVENTIL HFA;VENTOLIN HFA) 108 (90 Base) MCG/ACT inhaler Inhale 2 puffs into the lungs every 6 (six) hours as needed.   Yes [provider]  amLODipine (NORVASC) 10 MG tablet Take 1 tablet (10 mg total) by mouth daily. 03/13/16  Yes Alvia Grove, PA-C  aspirin 325 MG tablet Take 1 tablet (325 mg total) by mouth daily with breakfast. 07/10/16  Yes Holley Raring, MD  atorvastatin (LIPITOR) 20 MG tablet Take 1 tablet (20 mg total) by mouth daily. 03/13/16  Yes Alvia Grove, PA-C  carvedilol (COREG) 6.25 MG tablet Take 6.25 mg by mouth 2 (two) times daily with a meal.  02/06/17  Yes [provider]  cetirizine (ZYRTEC) 10 MG tablet Take 10 mg by mouth daily.    Yes [provider]  cholecalciferol (VITAMIN D) 1000 units tablet Take 2,000 Units by mouth daily.    Yes [provider]  clopidogrel (PLAVIX) 75 MG tablet Take 1  tablet (75 mg total) by mouth daily with breakfast. 03/04/17  Yes Conrad [place], MD  docusate sodium (COLACE) 100 MG capsule Take 100 mg by mouth 2 (two) times daily.   Yes [provider]  fluticasone (FLONASE) 50 MCG/ACT nasal spray Place 1 spray into both nostrils daily.   Yes [provider]  insulin aspart (NOVOLOG) 100 UNIT/ML injection Inject 0-15 Units into the skin 3 (three) times daily with meals. Before each meal 3 times a day, 140-199 - 3 units, 200-250 - 5 units, 251-299 - 8 units,  300-349 - 12 units,  350 or above 15 units. Patient taking differently: Inject 0-15 Units into the skin 3 (three) times daily with meals.  02/09/14  Yes Thurnell Lose, MD  insulin glargine (LANTUS) 100 UNIT/ML injection Inject 0.27 mLs (27 Units total) into the skin at bedtime. Patient taking differently: Inject 20-28 Units into the skin 2 (two) times daily. Take 20 units in the morning and 28 units at bedtime 09/16/16  Yes Reyne Dumas, MD  isosorbide dinitrate (ISORDIL) 30 MG tablet Take 30 mg by mouth  daily.  04/16/17  Yes [provider]  nitroGLYCERIN (NITROSTAT) 0.4 MG SL tablet Place 1 tablet (0.4 mg total) under the tongue every 5 (five) minutes as needed for chest pain. 07/09/16  Yes Holley Raring, MD  polyethylene glycol (MIRALAX / GLYCOLAX) packet Take 17 g by mouth 2 (two) times daily. Patient taking differently: Take 17 g by mouth daily as needed for mild constipation.  02/09/14  Yes Thurnell Lose, MD  potassium chloride SA (K-DUR,KLOR-CON) 20 MEQ tablet Take 20 mEq by mouth daily.   Yes [provider]  pregabalin (LYRICA) 50 MG capsule Take 50 mg by mouth at bedtime.    Yes [provider]  pregabalin (LYRICA) 50 MG capsule Take 50 mg by mouth 3 (three) times daily.    Yes [provider]  torsemide (DEMADEX) 20 MG tablet Take 40 mg by mouth 3 (three) times daily.  04/17/17  Yes [provider]  VITAMIN E PO Take 1 capsule by mouth daily.   Yes [provider]  furosemide (LASIX) 40 MG tablet Take 1 tablet (40 mg total) by mouth 2 (two) times daily. 09/16/16 10/16/16  Reyne Dumas, MD  losartan (COZAAR) 100 MG tablet Take 100 mg by mouth daily.    [provider]  metoprolol succinate (TOPROL-XL) 50 MG 24 hr tablet Take 50 mg by mouth daily. Take with or immediately following a meal.    [provider]  omeprazole (PRILOSEC) 20 MG capsule Take 20 mg by mouth daily.    [provider]      VITAL SIGNS:  Blood pressure (!) 189/44, pulse 97, temperature 98.8 F (37.1 C), temperature source Oral, resp. rate (!) 25, height 5\' 5"  (1.651 m), weight 101.2 kg, SpO2 100 %.  PHYSICAL EXAMINATION:  GENERAL:  77 y.o.-year-old patient lying in the bed with no acute distress.  EYES: Pupils equal, round, reactive to light and accommodation. No scleral icterus. Extraocular muscles intact.  HEENT: Head atraumatic, normocephalic. Oropharynx and nasopharynx clear.  Wax bilateral tympanic membrane obscured NECK:   Supple, no jugular venous distention. No thyroid enlargement, no tenderness.  LUNGS: Decreased breath sounds bilateral, no wheezing, positive rales at the bases.  No use of accessory muscles of respiration.  CARDIOVASCULAR: S1, S2 normal.  3 out of 6 systolic murmurs.  No rubs, or gallops.  ABDOMEN: Soft, nontender, nondistended. Bowel sounds present.  No organomegaly or mass.  EXTREMITIES: 3+ pedal edema, no cyanosis, or clubbing.  NEUROLOGIC: Cranial nerves II through XII are intact. Sensation intact. Gait not checked.  PSYCHIATRIC: The patient is alert and falls asleep easily.  SKIN: Right leg chronic scarring from bypass surgery.  Nonhealing ulcer on the top of the right first toe  LABORATORY PANEL:   CBC Recent Labs  Lab 04/10/18 1135  WBC 15.3*  HGB 13.9  HCT 42.5  PLT 168   ------------------------------------------------------------------------------------------------------------------  Chemistries  Recent Labs  Lab 04/10/18 1135  NA 140  K 3.7  CL 100  CO2 31  GLUCOSE 167*  BUN 13  CREATININE 0.90  CALCIUM 9.0  AST 22  ALT 22  ALKPHOS 76  BILITOT 1.1   ------------------------------------------------------------------------------------------------------------------  Cardiac Enzymes No results for input(s): TROPONINI in the last 168 hours. ------------------------------------------------------------------------------------------------------------------  RADIOLOGY:  Ct Head Wo Contrast  Result Date: 04/10/2018 CLINICAL DATA:  77 year old female with altered mental status. EXAM: CT HEAD WITHOUT CONTRAST TECHNIQUE: Contiguous axial images were obtained from the base of the skull through the vertex without intravenous contrast. COMPARISON:  08/29/2016 CT and prior studies FINDINGS: Brain: No evidence of acute infarction, hemorrhage, hydrocephalus, extra-axial collection or mass effect. A 1 cm calcified meningioma within the RIGHT posterior fossa is unchanged and  without adjacent parenchymal edema. Mild atrophy and mild chronic small-vessel white matter ischemic changes again noted. Vascular: Carotid atherosclerotic calcifications noted. Skull: Normal. Negative for fracture or focal lesion. Sinuses/Orbits: No acute finding. Other: None. IMPRESSION: 1. No evidence of acute intracranial abnormality. 2. Mild atrophy and chronic small-vessel white matter ischemic changes. 3. Stable 1 cm calcified meningioma within the RIGHT posterior fossa. Electronically Signed   By: Margarette Canada M.D.   On: 04/10/2018 12:08   Dg Chest Port 1 View  Result Date: 04/10/2018 CLINICAL DATA:  Altered mental status. EXAM: PORTABLE CHEST 1 VIEW COMPARISON:  10/30/2017. FINDINGS: Cardiomegaly with mild pulmonary vascular prominence and bilateral interstitial prominence. Findings consistent with mild CHF. Small bilateral pleural effusions. No pneumothorax. IMPRESSION: Cardiomegaly with mild pulmonary interstitial prominence consistent with mild CHF. Electronically Signed   By: Marcello Moores  Register   On: 04/10/2018 12:11    EKG:   Sinus rhythm 97 bpm  IMPRESSION AND PLAN:   1.  Acute encephalopathy with acute cystitis.  IV Rocephin ordered.  Urine culture ordered. 2.  Nonhealing ulcer right foot.  Send off a sedimentation rate and get an x-ray of the foot.  Case discussed with Dr. Vickki Muff podiatry to evaluate.  Add vancomycin and get blood cultures. 3.  Accelerated hypertension.  Start by giving her usual medications and go from there. 4.  Acute congestive heart failure with lower extremity edema and anasarca.  Change Lasix to IV twice daily.  Obtain an echocardiogram to 5.  Prior echocardiogram showing aortic stenosis.  Get a repeat echocardiogram to evaluate severity  6.  Type 2 diabetes mellitus on Lantus.  Add sliding scale.  Decrease the dose of her Lantus and follow for now. 7.  Neuropathy on Lyrica 8.  Obesity.  Weight loss needed 9.  History of peripheral vascular disease and stroke  on aspirin and Plavix.  All the records are reviewed and case discussed with ED provider. Management plans discussed with the patient, family and they are in agreement.  CODE STATUS: full code  TOTAL TIME TAKING CARE OF THIS PATIENT: 50 minutes, including acp time.    Loletha Grayer M.D on 04/10/2018 at 2:38 PM  Between 7am to  6pm - Pager - [phone]  After 6pm call admission pager [phone]  [facility]  [phone]  CC: Primary care physician; [name], MD

## 2018-04-10 NOTE — Progress Notes (Signed)
Pharmacy Antibiotic Note  [name] is a 77 y.o. female admitted on 04/10/2018 with foot wound/nonhealing ulcer.  Pharmacy has been consulted for Vancomycin dosing.  Patient also being treated for a UTI w/ Ceftriaxone  Plan: Patient received Vancomycin 1250 mg IV x 1. Will continue with stacked dosing- Vancomycin 1000 IV every 18 hours.  Goal trough 15-20 mcg/mL.  Ke 0.045  T1/2 15.4  Vd 52.2  Adj BW 74.6 kg   Will order Vancomycin trough prior to 5th dose.   Height: 5\' 5"  (165.1 cm) Weight: 223 lb (101.2 kg) IBW/kg (Calculated) : 57  Temp (24hrs), Avg:98.7 F (37.1 C), Min:98.6 F (37 C), Max:98.8 F (37.1 C)  Recent Labs  Lab 04/10/18 1135  WBC 15.3*  CREATININE 0.90    Estimated Creatinine Clearance: 63.7 mL/min (by C-G formula based on SCr of 0.9 mg/dL).    Allergies  Allergen Reactions  . Iodine Anaphylaxis  . Penicillins Anaphylaxis    Rec'd cefazolin 02/04/14 pre-op for femur ORIF Has patient had a PCN reaction causing immediate rash, facial/tongue/throat swelling, SOB or lightheadedness with hypotension: Unknown Has patient had a PCN reaction causing severe rash involving mucus membranes or skin necrosis: Unknown Has patient had a PCN reaction that required hospitalization: Unknown Has patient had a PCN reaction occurring within the last 10 years: Unknown If all of the above answers are "NO", then may proceed with Cephalosporin u  . Shellfish Allergy Anaphylaxis  . Shellfish-Derived Products Anaphylaxis  . Sulfa Antibiotics Anaphylaxis  . Sulfacetamide Sodium Anaphylaxis  . Sulfasalazine Anaphylaxis  . Atorvastatin     Other reaction(s): Unknown  . Morphine And Related Other (See Comments)    Altered mental status    Antimicrobials this admission: Vanc 9/6 >>   CTX 9/6 >>    Dose adjustments this admission:    Microbiology results: 9/6 BCx: pending 9/6 UCx: pending    Sputum:      MRSA PCR:    Thank you for allowing pharmacy to be a part of this  patient's care.  Caiden Monsivais A 04/10/2018 7:08 PM

## 2018-04-10 NOTE — ED Provider Notes (Signed)
[facility] Emergency Department Provider Note   ____________________________________________    I have reviewed the triage vital signs and the nursing notes.   HISTORY  Chief Complaint Altered Mental Status  Son provides majority of the history due to altered mental status.   HPI [name] is a 77 y.o. female who presents for reported altered mental status.  Son reports the patient has a history of CVA which is left her with some right-sided weakness.  She also has diabetes.  He reports over the last 2 to 3 days especially in the morning she is seen more confused.  This morning she seemed quite confused and unaware of what was going on.  He denies fevers or chills.  Intermittent cough noted.  Currently she seems to be better than this morning.   Past Medical History:  Diagnosis Date  . Acute heart failure ([place])   . Aortic stenosis   . Complication of anesthesia    Hard to wake patient up after having anesthesia  . Diabetes mellitus without complication ([place])   . Diabetic neuropathy (HCC)    Takes Lyrica  . Edema of both legs    Takes Lasix  . GERD (gastroesophageal reflux disease)   . High cholesterol   . HTN (hypertension)   . Hypertension   . PAD (peripheral artery disease) ([place])   . Shortness of breath dyspnea   . Spasm of back muscles   . Stroke ([place])   . Wound, open    Right great toe    Patient Active Problem List   Diagnosis Date Noted  . Sepsis due to Streptococcus, group B ([place])   . Multiple open wounds of lower leg   . PVD (peripheral vascular disease) ([place])   . Bacteremia 08/31/2016  . Sepsis ([place]) 08/29/2016  . Bilateral pneumonia 08/29/2016  . Benign essential HTN   . Acute on chronic diastolic heart failure ([place])   . Ulcer of right lower extremity ([place])   . Chest pain 07/05/2016  . Impacted cerumen of both ears 06/05/2016  . 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 Pleural effusion, bilateral     . Pulmonary congestion   . Wheeze   . Dyspnea   . CHF (congestive heart failure) ([place])   . Right sided weakness   . Acute respiratory acidosis   . Surgery, elective   . Anemia, iron deficiency   . Leukocytosis   . Cerebrovascular accident (CVA) due to thrombosis of precerebral artery ([place]) 03/01/2016  . 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 Acute respiratory failure ([place])   . PAD (peripheral artery disease) ([place]) 02/28/2016  . Atherosclerotic peripheral vascular disease with intermittent claudication ([place]) 12/08/2015  . Open toe wound 05/04/2014  . Anemia, chronic disease 04/27/2014  . Occult blood positive stool 04/17/2014  . Edema 04/03/2014  . Unspecified hereditary and idiopathic peripheral neuropathy 03/14/2014  . Acute and chronic respiratory failure 02/14/2014  . DM2 (diabetes mellitus, type 2) ([place]) 02/14/2014  . Respiratory failure ([place]) 02/12/2014  . Altered mental status 02/12/2014  . Acute encephalopathy 02/12/2014  . Acute respiratory failure with hypercapnia ([place]) 02/12/2014  . Aortic stenosis 02/04/2014  . Closed left subtrochanteric femur fracture ([place]) 02/03/2014  . Hip fracture, left ([place]) 02/03/2014  . Hypokalemia 02/03/2014  . Fibula fracture 02/03/2014  . MTP instability 02/03/2014  . Hip fracture ([place]) 02/03/2014  . HTN (hypertension)   . Type 2 diabetes mellitus without  complication, without long-term current use of insulin ([place])   . HLD (hyperlipidemia)     Past Surgical History:  Procedure Laterality Date  . BYPASS GRAFT POPLITEAL TO TIBIAL Right 02/28/2016   Procedure: BYPASS GRAFT RIGHT BELOW KNEE POPLITEAL TO PERONEAL USING REVERSED RIGHT GREATER SAPHENOUS VEIN;  Surgeon: Conrad Oak Hill, MD;  Location: [place];  Service: Vascular;  Laterality: Right;  . CYST EXCISION     Abdomen  . EYE SURGERY Bilateral    Cataract removal  . INTRAMEDULLARY (IM) NAIL INTERTROCHANTERIC Left 02/04/2014   Procedure:  INTRAMEDULLARY (IM) NAIL INTERTROCHANTRIC FEMORAL;  Surgeon: Mauri Pole, MD;  Location: [place];  Service: Orthopedics;  Laterality: Left;  . IR GENERIC HISTORICAL  03/01/2016   IR ANGIO INTRA EXTRACRAN SEL COM CAROTID INNOMINATE UNI R MOD SED 03/01/2016 [name], MD [facility]  . IR GENERIC HISTORICAL  03/01/2016   IR ENDOVASC INTRACRANIAL INF OTHER THAN THROMBO ART INC DIAG ANGIO 03/01/2016 [name], MD [facility]  . IR GENERIC HISTORICAL  03/01/2016   IR INTRAVSC STENT CERV CAROTID W/O EMB-PROT MOD SED INC ANGIO 03/01/2016 [name], MD [facility]  . IR GENERIC HISTORICAL  06/03/2016   IR RADIOLOGIST EVAL & MGMT 06/03/2016 [facility]  . ORIF TOE FRACTURE Right 02/08/2014   Procedure: OPEN REDUCTION INTERNAL FIXATION Right METATARSAL  FRACTURE ;  Surgeon: Wylene Simmer, MD;  Location: [place];  Service: Orthopedics;  Laterality: Right;  . PERIPHERAL VASCULAR CATHETERIZATION N/A 12/28/2015   Procedure: Abdominal Aortogram w/Lower Extremity;  Surgeon: Conrad Shafter, MD;  Location: [place] [facility];  Service: Cardiovascular;  Laterality: N/A;  . RADIOLOGY WITH ANESTHESIA N/A 03/01/2016   Procedure: RADIOLOGY WITH ANESTHESIA;  Surgeon: [name], MD;  Location: [place];  Service: Radiology;  Laterality: N/A;  . VEIN HARVEST Right 02/28/2016   Procedure: RIGHT GREATER SAPHENOUS VEIN HARVEST;  Surgeon: Conrad [place], MD;  Location: [place];  Service: Vascular;  Laterality: Right;    Prior to Admission medications   Medication Sig Start Date End Date Taking? Authorizing Provider  acetaminophen (TYLENOL) 325 MG tablet Take 2 tablets (650 mg total) by mouth every 6 (six) hours as needed for moderate pain or fever. 09/16/16   Reyne Dumas, MD  albuterol (PROVENTIL HFA;VENTOLIN HFA) 108 (90 Base) MCG/ACT inhaler Inhale 2 puffs into the lungs every 6 (six) hours as needed.    [provider]  amLODipine (NORVASC) 10 MG tablet Take 1 tablet (10 mg total) by mouth  daily. 03/13/16   Alvia Grove, PA-C  aspirin 325 MG tablet Take 1 tablet (325 mg total) by mouth daily with breakfast. Patient taking differently: Take 81 mg by mouth daily with breakfast.  07/10/16   Holley Raring, MD  aspirin 325 MG tablet Take by mouth. 03/13/16   [provider]  atorvastatin (LIPITOR) 20 MG tablet Take 1 tablet (20 mg total) by mouth daily. 03/13/16   Alvia Grove, PA-C  B-D ULTRAFINE III SHORT PEN 31G X 8 MM MISC  04/01/17   [provider]  carvedilol (COREG) 12.5 MG tablet Take 12.5 mg by mouth 2 (two) times daily with a meal.    [provider]  carvedilol (COREG) 12.5 MG tablet Take by mouth.    [provider]  carvedilol (COREG) 6.25 MG tablet  02/06/17   [provider]  cetirizine (ZYRTEC) 10 MG tablet Take 10 mg by mouth daily.     [provider]  cholecalciferol (VITAMIN  D) 1000 units tablet Take 1,000 Units by mouth daily.    [provider]  Cholecalciferol (VITAMIN D3) 1000 units CAPS Take by mouth.    [provider]  clopidogrel (PLAVIX) 75 MG tablet Take 1 tablet (75 mg total) by mouth daily with breakfast. 03/04/17   Conrad Hidalgo, MD  clopidogrel (PLAVIX) 75 MG tablet Take by mouth. 03/13/16   [provider]  collagenase (SANTYL) ointment Apply topically 2 (two) times daily. Apply to dorsum right foot wound daily. 03/13/16   Alvia Grove, PA-C  docusate sodium (COLACE) 100 MG capsule Take 100 mg by mouth 2 (two) times daily.    [provider]  DOXYCYCLINE PO Take 100 mg 2 (two) times daily by mouth.    [provider]  fluticasone (FLONASE) 50 MCG/ACT nasal spray Place 1 spray into both nostrils daily.    [provider]  furosemide (LASIX) 40 MG tablet Take 1 tablet (40 mg total) by mouth 2 (two) times daily. 09/16/16 10/16/16  Reyne Dumas, MD  insulin aspart (NOVOLOG) 100 UNIT/ML injection Inject 0-15 Units into the skin 3 (three) times daily  with meals. Before each meal 3 times a day, 140-199 - 3 units, 200-250 - 5 units, 251-299 - 8 units,  300-349 - 12 units,  350 or above 15 units. Patient taking differently: Inject 12 Units into the skin 3 (three) times daily with meals.  02/09/14   Thurnell Lose, MD  insulin glargine (LANTUS) 100 UNIT/ML injection Inject 0.27 mLs (27 Units total) into the skin at bedtime. 09/16/16   Reyne Dumas, MD  isosorbide dinitrate (ISORDIL) 30 MG tablet  04/16/17   [provider]  isosorbide mononitrate (IMDUR) 60 MG 24 hr tablet Take 1 tablet (60 mg total) by mouth daily. 07/10/16   Holley Raring, MD  LANTUS SOLOSTAR 100 UNIT/ML Solostar Pen  04/15/17   [provider]  losartan (COZAAR) 100 MG tablet Take 100 mg by mouth daily.    [provider]  metoprolol (LOPRESSOR) 50 MG tablet Take 50 mg by mouth 2 (two) times daily.    [provider]  metoprolol succinate (TOPROL-XL) 50 MG 24 hr tablet Take 50 mg by mouth daily. Take with or immediately following a meal.    [provider]  nitroGLYCERIN (NITROSTAT) 0.4 MG SL tablet Place 1 tablet (0.4 mg total) under the tongue every 5 (five) minutes as needed for chest pain. 07/09/16   Holley Raring, MD  omeprazole (PRILOSEC) 20 MG capsule Take 20 mg by mouth daily.    [provider]  polyethylene glycol (MIRALAX / GLYCOLAX) packet Take 17 g by mouth 2 (two) times daily. Patient taking differently: Take 17 g by mouth daily as needed for mild constipation.  02/09/14   Thurnell Lose, MD  polyethylene glycol powder (GLYCOLAX/MIRALAX) powder  02/27/17   [provider]  potassium chloride SA (K-DUR,KLOR-CON) 20 MEQ tablet Take 20 mEq by mouth daily.    [provider]  pregabalin (LYRICA) 50 MG capsule Take 50 mg by mouth 3 (three) times daily.    [provider]  pregabalin (LYRICA) 50 MG capsule Take by mouth.    [provider]  ranolazine (RANEXA) 500 MG 12 hr tablet Take  1 tablet (500 mg total) by mouth 2 (two) times daily. 07/09/16   Holley Raring, MD  ranolazine (RANEXA) 500 MG 12 hr tablet Take by mouth. 07/09/16   [provider]  torsemide (DEMADEX) 20 MG  tablet  04/17/17   [provider]  VITAMIN E PO Take 1 capsule by mouth daily.    [provider]     Allergies Iodine; Penicillins; Shellfish allergy; Shellfish-derived products; Sulfa antibiotics; Sulfacetamide sodium; Sulfasalazine; and Atorvastatin  Family History  Problem Relation Age of Onset  . Diabetes Other     Social History Social History   Tobacco Use  . Smoking status: Never Smoker  . Smokeless tobacco: Never Used  . Tobacco comment: Never smoked  Substance Use Topics  . Alcohol use: No    Alcohol/week: 0.0 standard drinks  . Drug use: No    Review of Systems limited by altered mental status  Constitutional: No reports of fever Eyes: No reports of discharge ENT: No epistaxis Cardiovascular: Denies chest pain. Respiratory: Intermittent cough Gastrointestinal: No vomiting Genitourinary: No foul-smelling urine Musculoskeletal: No joint swelling Skin: Negative for rash. Neurological: Negative for new weakness   ____________________________________________   PHYSICAL EXAM:  VITAL SIGNS: ED Triage Vitals  Enc Vitals Group     BP 04/10/18 1100 (!) 127/48     Pulse Rate 04/10/18 1100 95     Resp 04/10/18 1100 (!) 22     Temp 04/10/18 1149 98.8 F (37.1 C)     Temp Source 04/10/18 1149 Oral     SpO2 04/10/18 1100 95 %     Weight 04/10/18 1100 101.2 kg (223 lb)     Height 04/10/18 1100 1.651 m (5\' 5" )     Head Circumference --      Peak Flow --      Pain Score 04/10/18 1128 0     Pain Loc --      Pain Edu? --      Excl. in [place]? --     Constitutional: Alert but disoriented  Head: Atraumatic. Nose: No congestion/rhinnorhea. Mouth/Throat: Mucous membranes are moist.   Cardiovascular: Normal rate, regular rhythm.  Good peripheral  circulation. Respiratory: Normal respiratory effort.  No retractions.  Gastrointestinal: Soft and nontender. No distention. Genitourinary: deferred Musculoskeletal: No lower extremity tenderness nor edema.  Warm and well perfused Neurologic: No gross neurological deficits are appreciated, cranial nerves II through XII are normal.   Skin:  Skin is warm, dry.  Chronic wound to the right foot, no surrounding erythema or discharge   ____________________________________________   LABS (all labs ordered are listed, but only abnormal results are displayed)  Labs Reviewed  COMPREHENSIVE METABOLIC PANEL - Abnormal; Notable for the following components:      Result Value   Glucose, Bld 167 (*)    All other components within normal limits  CBC WITH DIFFERENTIAL/PLATELET - Abnormal; Notable for the following components:   WBC 15.3 (*)    RDW 17.0 (*)    Neutro Abs 14.3 (*)    Lymphs Abs 0.3 (*)    All other components within normal limits  URINALYSIS, COMPLETE (UACMP) WITH MICROSCOPIC - Abnormal; Notable for the following components:   Color, Urine YELLOW (*)    APPearance CLEAR (*)    pH 9.0 (*)    Hgb urine dipstick SMALL (*)    Leukocytes, UA LARGE (*)    All other components within normal limits  URINE CULTURE  CBG MONITORING, ED   ____________________________________________  EKG  ED ECG REPORT I, Lavonia Drafts, the attending physician, personally viewed and interpreted this ECG.  Date: 04/10/2018  Rhythm: normal sinus rhythm QRS Axis: normal Intervals: normal ST/T Wave abnormalities: normal Narrative Interpretation: no evidence of acute ischemia  ____________________________________________  RADIOLOGY  CT head no acute distress ____________________________________________   PROCEDURES  Procedure(s) performed: No  Procedures   Critical Care performed: No ____________________________________________   INITIAL IMPRESSION / ASSESSMENT AND PLAN / ED  COURSE  Pertinent labs & imaging results that were available during my care of the patient were reviewed by me and considered in my medical decision making (see chart for details).  Patient presents with mild confusion which seems to be worsening over the last couple days.  Suspicious for metabolic encephalopathy.  We will check labs including chest x-ray, urinalysis, CT head and reevaluate  CT head is overall reassuring, lab work is significant for elevated white blood cell count and urinalysis demonstrates significantly elevated leukocytes consistent with urinary tract infection.  We will give IV Rocephin and admit to the hospital service.  Patient has had cefazolin in the past and tolerated it.     ____________________________________________   FINAL CLINICAL IMPRESSION(S) / ED DIAGNOSES  Final diagnoses:  Lower urinary tract infectious disease  Acute metabolic encephalopathy        Note:  This document was prepared using Dragon voice recognition software and may include unintentional dictation errors.    Lavonia Drafts, MD 04/10/18 [phone]

## 2018-04-10 NOTE — ED Triage Notes (Signed)
Pt comes from home with AMS per [facility]. Family told EMS she was well at 1230am this momring, then upon waking at 0800 she appeared confused and was unresponsive to questions. CBG was 197 per EMS. She was given 3u novolog and 20u of lantus. EMS reports that there was trouble getting a BP in her left arm. BP was 102/49 in left arm. She has a hx of a stroke 1 year ago. Temp was 98.8 oral.

## 2018-04-10 NOTE — ED Notes (Signed)
Lab at bedside to obtain blood 

## 2018-04-10 NOTE — ED Notes (Signed)
Lab called for straight stick.

## 2018-04-10 NOTE — Progress Notes (Signed)
Patient ID: [name], female   DOB: 06/11/1941, 77 y.o.   MRN: [id number]  ACP note  Patient is unable to participate in this conversation Husband, son and daughter-in-law at the bedside  Diagnosis: Acute cystitis, acute encephalopathy, chronic wound infection right foot, acute congestive heart failure, accelerated hypertension, aortic stenosis, type 2 diabetes history of stroke, history of peripheral vascular disease  CODE STATUS discussed.  Patient is a full code at this point time.  Plan.  IV antibiotics with vancomycin Rocephin follow-up urine culture and blood cultures.  X-ray of the right foot and check ESR.  Podiatry consultation.  Physical therapy consultation.  Monitor patient's status during the hospital course.  Time spent on ACP discussion 17 minutes Dr. Loletha Grayer

## 2018-04-10 NOTE — Progress Notes (Signed)
   04/10/18 1803  Clinical Encounter Type  Visited With Patient  Visit Type Initial  Referral From Nurse   Responded to Order Requisition for prayer request, but patient was resting and asked that Chaplain return another time. Will refer to incoming chaplain for follow-up tomorrow.

## 2018-04-11 LAB — BASIC METABOLIC PANEL
ANION GAP: 6 (ref 5–15)
BUN: 12 mg/dL (ref 8–23)
CO2: 31 mmol/L (ref 22–32)
Calcium: 8.8 mg/dL — ABNORMAL LOW (ref 8.9–10.3)
Chloride: 103 mmol/L (ref 98–111)
Creatinine, Ser: 0.7 mg/dL (ref 0.44–1.00)
GFR calc Af Amer: >60 mL/min (ref >60)
Glucose, Bld: 142 mg/dL — ABNORMAL HIGH (ref 70–99)
POTASSIUM: 4.4 mmol/L (ref 3.5–5.1)
Sodium: 140 mmol/L (ref 135–145)

## 2018-04-11 LAB — GLUCOSE, CAPILLARY
GLUCOSE-CAPILLARY: 149 mg/dL — AB (ref 70–99)
GLUCOSE-CAPILLARY: 264 mg/dL — AB (ref 70–99)
GLUCOSE-CAPILLARY: 259 mg/dL — AB (ref 70–99)
Glucose-Capillary: 176 mg/dL — ABNORMAL HIGH (ref 70–99)

## 2018-04-11 LAB — CBC
HEMATOCRIT: 41.8 % (ref 35.0–47.0)
HEMOGLOBIN: 13.7 g/dL (ref 12.0–16.0)
MCH: 27.8 pg (ref 26.0–34.0)
MCHC: 32.7 g/dL (ref 32.0–36.0)
MCV: 85.1 fL (ref 80.0–100.0)
Platelets: 151 10*3/uL (ref 150–440)
RBC: 4.91 MIL/uL (ref 3.80–5.20)
RDW: 17.5 % — ABNORMAL HIGH (ref 11.5–14.5)
WBC: 9 10*3/uL (ref 3.6–11.0)

## 2018-04-11 LAB — HEMOGLOBIN A1C
Hgb A1c MFr Bld: 8.8 % — ABNORMAL HIGH (ref 4.8–5.6)
MEAN PLASMA GLUCOSE: 205.86 mg/dL

## 2018-04-11 NOTE — Evaluation (Signed)
Physical Therapy Evaluation Patient Details Name: [name] MRN: [id number] DOB: 08/09/1940 Today's Date: 04/11/2018   History of Present Illness  77 yo female with onset of acute cystitis and pleural effusion was admitted and noted to be in CHF, had AMS and was referred to PT for mobility check.  Pt is complicated by being quite HOH and having significant confusion still affecting her comprehension.  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  Her plan is to ask for SNF stay to gradually increase her strength, tolerance for sitting and to allow her to recover previous level of ability that allowed her to live home with family.  Pt is unable to give an accurate PLOF so will anticipate getting family history when available.  Follow acutely for these objectives.    Follow Up Recommendations SNF    Equipment Recommendations  None recommended by PT    Recommendations for Other Services       Precautions / Restrictions Precautions Precautions: Fall(telemetry) Precaution Comments: HOH, better L ear Restrictions Weight Bearing Restrictions: No      Mobility  Bed Mobility Overal bed mobility: Needs Assistance Bed Mobility: Supine to Sit;Sit to Supine     Supine to sit: Total assist Sit to supine: Total assist   General bed mobility comments: pt is unable to do much initiation and cannot do motor planning to roll to sit up  Transfers Overall transfer level: Needs assistance Equipment used: None             General transfer comment: could not get pt sitting securely enough to attempt  Ambulation/Gait             General Gait Details: unable to attempt  Stairs            Wheelchair Mobility    Modified Rankin (Stroke  Patients Only)       Balance Overall balance assessment: Needs assistance Sitting-balance support: Feet supported Sitting balance-Leahy Scale: Zero                                       Pertinent Vitals/Pain Pain Assessment: Faces Faces Pain Scale: Hurts even more Pain Location: back with attempts to sit up Pain Descriptors / Indicators: Tightness Pain Intervention(s): Limited activity within patient's tolerance;Monitored during session;Repositioned    Home Living Family/patient expects to be discharged to:: Skilled nursing facility Living Arrangements: Spouse/significant other;Children Available Help at Discharge: Family;Available 24 hours/day Type of Home: House         Home Equipment: Walker - 2 wheels Additional Comments: pt able to give very little information about PLOF    Prior Function Level of [place]: Needs assistance   Gait / Transfers Assistance Needed: RW per pt     Comments: Pt not able to give information     Hand Dominance        Extremity/Trunk Assessment   Upper Extremity Assessment Upper Extremity Assessment: Generalized weakness    Lower Extremity Assessment Lower Extremity Assessment: Generalized weakness    Cervical / Trunk Assessment Cervical / Trunk Assessment: Kyphotic  Communication   Communication: HOH(hears better L ear)  Cognition Arousal/Alertness: Lethargic Behavior During Therapy: Flat affect  Overall Cognitive Status: No family/caregiver present to determine baseline cognitive functioning                                 General Comments: pt has meningioma and is unclear if she was cognitively impaired before this admission      General Comments General comments (skin integrity, edema, etc.): pt is generally resistive of being moved once initiating so could not attempt to stand or even sit securely    Exercises     Assessment/Plan    PT Assessment Patient needs continued PT  services  PT Problem List Decreased strength;Decreased range of motion;Decreased activity tolerance;Decreased balance;Decreased mobility;Decreased coordination;Decreased cognition;Decreased safety awareness;Cardiopulmonary status limiting activity       PT Treatment Interventions DME instruction;Functional mobility training;Therapeutic activities;Therapeutic exercise;Balance training;Neuromuscular re-education;Patient/family education    PT Goals (Current goals can be found in the Care Plan section)  Acute Rehab PT Goals Patient Stated Goal: none stated PT Goal Formulation: Patient unable to participate in goal setting Time For Goal Achievement: 04/25/18 Potential to Achieve Goals: Fair    Frequency Min 2X/week   Barriers to discharge Decreased caregiver support requires a hoyer lift and assistance of 2 for bed mobiity or transfers    Co-evaluation               AM-PAC PT "6 Clicks" Daily Activity  Outcome Measure Difficulty turning over in bed (including adjusting bedclothes, sheets and blankets)?: Unable Difficulty moving from lying on back to sitting on the side of the bed? : Unable Difficulty sitting down on and standing up from a chair with arms (e.g., wheelchair, bedside commode, etc,.)?: Unable Help needed moving to and from a bed to chair (including a wheelchair)?: Total Help needed walking in hospital room?: Total Help needed climbing 3-5 steps with a railing? : Total 6 Click Score: 6    End of Session Equipment Utilized During Treatment: Oxygen Activity Tolerance: Patient limited by fatigue;Patient limited by lethargy;Treatment limited secondary to medical complications (Comment) Patient left: in bed;with call bell/phone within reach;with bed alarm set Nurse Communication: Mobility status PT Visit Diagnosis: Muscle weakness (generalized) (M62.81);Other abnormalities of gait and mobility (R26.89);Adult, failure to thrive (R62.7)    Time: 1035-1101 PT Time  Calculation (min) (ACUTE ONLY): 26 min   Charges:   PT Evaluation $PT Eval Moderate Complexity: 1 Mod PT Treatments $Therapeutic Activity: 8-22 mins       Ramond Dial 04/11/2018, 9:03 PM   Mee Hives, PT MS Acute Rehab Dept. Number: [place] [facility] [place]

## 2018-04-11 NOTE — NC FL2 (Addendum)
[place] LEVEL OF CARE SCREENING TOOL     IDENTIFICATION  Patient Name: [name] Birthdate: 12-14-1940 Sex: female Admission Date (Current Location): 04/10/2018  [place] and [place] Number:  Engineering geologist and Address:  [facility], [address] [place], [place] [address]      Provider Number: [id number]  Attending Physician Name and Address:  Dustin Flock, MD  Relative Name and Phone Number:  Khori Underberg (Spouse) [phone]; Gladstone Lighter (Son) [phone]; Lane Hacker (Daughter) [phone]    Current Level of Care: Hospital Recommended Level of Care: [place] Prior Approval Number:    Date Approved/Denied:   PASRR Number: [id number]  Discharge Plan: SNF    Current Diagnoses: Patient Active Problem List   Diagnosis Date Noted  . Acute cystitis 04/10/2018  . Sepsis due to Streptococcus, group B ([place])   . Multiple open wounds of lower leg   . PVD (peripheral vascular disease) ([place])   . Bacteremia 08/31/2016  . Sepsis ([place]) 08/29/2016  . Bilateral pneumonia 08/29/2016  . Benign essential HTN   . Acute on chronic diastolic heart failure ([place])   . Ulcer of right lower extremity ([place])   . Chest pain 07/05/2016  . Impacted cerumen of both ears 06/05/2016  . 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 Pleural effusion, bilateral   . Pulmonary congestion   . Wheeze   . Dyspnea   . CHF (congestive heart failure) ([place])   . Right sided weakness   . Acute respiratory acidosis   . Surgery, elective   . Anemia, iron deficiency   . Leukocytosis   . Cerebrovascular accident (CVA) due to thrombosis of precerebral artery ([place]) 03/01/2016  . 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 Acute respiratory failure ([place])   . PAD (peripheral artery disease) ([place]) 02/28/2016  . Atherosclerotic peripheral vascular disease with intermittent  claudication ([place]) 12/08/2015  . Open toe wound 05/04/2014  . Anemia, chronic disease 04/27/2014  . Occult blood positive stool 04/17/2014  . Edema 04/03/2014  . Unspecified hereditary and idiopathic peripheral neuropathy 03/14/2014  . Acute and chronic respiratory failure 02/14/2014  . DM2 (diabetes mellitus, type 2) ([place]) 02/14/2014  . Respiratory failure ([place]) 02/12/2014  . Altered mental status 02/12/2014  . Acute encephalopathy 02/12/2014  . Acute respiratory failure with hypercapnia ([place]) 02/12/2014  . Aortic stenosis 02/04/2014  . Closed left subtrochanteric femur fracture ([place]) 02/03/2014  . Hip fracture, left ([place]) 02/03/2014  . Hypokalemia 02/03/2014  . Fibula fracture 02/03/2014  . MTP instability 02/03/2014  . Hip fracture ([place]) 02/03/2014  . HTN (hypertension)   . Type 2 diabetes mellitus without complication, without long-term current use of insulin ([place])   . HLD (hyperlipidemia)     Orientation RESPIRATION BLADDER Height & Weight     Self, Time, Situation, Place  O2(2L o2) Incontinent Weight: 223 lb (101.2 kg) Height:  5\' 5"  (165.1 cm)  BEHAVIORAL SYMPTOMS/MOOD NEUROLOGICAL BOWEL NUTRITION STATUS      Continent Diet(Heart healthy/Carb modified)  AMBULATORY STATUS COMMUNICATION OF NEEDS Skin   Extensive Assist Verbally Acute cystitis on legs                       Personal Care Assistance Level of Assistance  Bathing, Feeding, Dressing Bathing Assistance: Limited assistance Feeding assistance: Independent Dressing Assistance: Limited assistance     Functional Limitations Info  Sight, Hearing, Speech Sight Info: Adequate Hearing Info:  Impaired Speech Info: Adequate    SPECIAL CARE FACTORS FREQUENCY  PT (By licensed PT), OT (By licensed OT)     PT Frequency: Up to 5X per week OT Frequency: Up to 3X per week            Contractures Contractures Info: Not present    Additional Factors Info  Code Status, Allergies Code Status Info:  Full Allergies Info: Iodine, Penicillins, Shellfish Allergy, Shellfish-derived Products, Sulfa Antibiotics, Sulfacetamide Sodium, Sulfasalazine, Atorvastatin, Morphine And Related           Current Medications (04/11/2018):  This is the current hospital active medication list Current Facility-Administered Medications  Medication Dose Route Frequency Provider Last Rate Last Dose  . acetaminophen (TYLENOL) tablet 650 mg  650 mg Oral Q6H PRN Loletha Grayer, MD       Or  . acetaminophen (TYLENOL) suppository 650 mg  650 mg Rectal Q6H PRN Wieting, Richard, MD      . albuterol (PROVENTIL) (2.5 MG/3ML) 0.083% nebulizer solution 3 mL  3 mL Inhalation Q6H PRN Wieting, Richard, MD      . amLODipine (NORVASC) tablet 10 mg  10 mg Oral Daily Loletha Grayer, MD   10 mg at 04/11/18 0858  . aspirin EC tablet 325 mg  325 mg Oral Q breakfast Loletha Grayer, MD   325 mg at 04/11/18 0858  . atorvastatin (LIPITOR) tablet 20 mg  20 mg Oral q1800 Loletha Grayer, MD   20 mg at 04/10/18 1812  . carvedilol (COREG) tablet 6.25 mg  6.25 mg Oral BID WC Loletha Grayer, MD   6.25 mg at 04/11/18 0858  . cefTRIAXone (ROCEPHIN) 1 g in sodium chloride 0.9 % 100 mL IVPB  1 g Intravenous Q24H Loletha Grayer, MD 200 mL/hr at 04/11/18 0905 1 g at 04/11/18 0905  . cholecalciferol (VITAMIN D) tablet 2,000 Units  2,000 Units Oral Daily Loletha Grayer, MD   2,000 Units at 04/11/18 0857  . clopidogrel (PLAVIX) tablet 75 mg  75 mg Oral Q breakfast Loletha Grayer, MD   75 mg at 04/11/18 0857  . docusate sodium (COLACE) capsule 100 mg  100 mg Oral BID Loletha Grayer, MD   100 mg at 04/11/18 0858  . enoxaparin (LOVENOX) injection 40 mg  40 mg Subcutaneous Q24H Loletha Grayer, MD   40 mg at 04/10/18 2257  . fluticasone (FLONASE) 50 MCG/ACT nasal spray 1 spray  1 spray Each Nare Daily PRN Wieting, Richard, MD      . furosemide (LASIX) injection 40 mg  40 mg Intravenous BID Loletha Grayer, MD   40 mg at 04/11/18 0857  .  insulin aspart (novoLOG) injection 0-5 Units  0-5 Units Subcutaneous QHS Wieting, Richard, MD      . insulin aspart (novoLOG) injection 0-9 Units  0-9 Units Subcutaneous TID WC Loletha Grayer, MD   1 Units at 04/11/18 0856  . insulin glargine (LANTUS) injection 20 Units  20 Units Subcutaneous QHS Loletha Grayer, MD   20 Units at 04/10/18 2259  . isosorbide dinitrate (ISORDIL) tablet 30 mg  30 mg Oral Daily Wieting, Richard, MD      . loratadine (CLARITIN) tablet 10 mg  10 mg Oral Daily Loletha Grayer, MD   10 mg at 04/11/18 0858  . losartan (COZAAR) tablet 100 mg  100 mg Oral Daily Loletha Grayer, MD   100 mg at 04/11/18 0857  . metoprolol succinate (TOPROL-XL) 24 hr tablet 50 mg  50 mg Oral Daily Loletha Grayer, MD   50  mg at 04/11/18 0857  . nitroGLYCERIN (NITROSTAT) SL tablet 0.4 mg  0.4 mg Sublingual Q5 min PRN Loletha Grayer, MD      . ondansetron [facility]) tablet 4 mg  4 mg Oral Q6H PRN Wieting, Richard, MD       Or  . ondansetron (ZOFRAN) injection 4 mg  4 mg Intravenous Q6H PRN Wieting, Richard, MD      . pantoprazole (PROTONIX) EC tablet 40 mg  40 mg Oral Daily Loletha Grayer, MD   40 mg at 04/11/18 0857  . polyethylene glycol (MIRALAX / GLYCOLAX) packet 17 g  17 g Oral Daily PRN Wieting, Richard, MD      . potassium chloride SA (K-DUR,KLOR-CON) CR tablet 20 mEq  20 mEq Oral Daily Loletha Grayer, MD   20 mEq at 04/11/18 0858  . pregabalin (LYRICA) capsule 50 mg  50 mg Oral TID Loletha Grayer, MD   50 mg at 04/11/18 0858  . vancomycin (VANCOCIN) IVPB 1000 mg/200 mL premix  1,000 mg Intravenous Q18H Loletha Grayer, MD 200 mL/hr at 04/11/18 0459       Discharge Medications: Please see discharge summary for a list of discharge medications.  Relevant Imaging Results:  Relevant Lab Results:   Additional Information [phone]  Zettie Pho, LCSW

## 2018-04-11 NOTE — Progress Notes (Signed)
[facility]                                                                                                                                                                                  Patient Demographics   [name], is a 77 y.o. female, DOB - 09-30-1940, RKY:[id number]  Admit date - 04/10/2018   Admitting Physician Loletha Grayer, MD  Outpatient Primary MD for the patient is Adamo, Hattie Perch, MD   LOS - 1  Subjective: Patient admitted with acute encephalopathy.  Now doing better hard of hearing    Review of Systems:   CONSTITUTIONAL: No documented fever. No fatigue, weakness. No weight gain, no weight loss.  EYES: No blurry or double vision.  ENT: No tinnitus. No postnasal drip. No redness of the oropharynx.  RESPIRATORY: No cough, no wheeze, no hemoptysis. No dyspnea.  CARDIOVASCULAR: No chest pain. No orthopnea. No palpitations. No syncope.  GASTROINTESTINAL: No nausea, no vomiting or diarrhea. No abdominal pain. No melena or hematochezia.  GENITOURINARY: No dysuria or hematuria.  ENDOCRINE: No polyuria or nocturia. No heat or cold intolerance.  HEMATOLOGY: No anemia. No bruising. No bleeding.  INTEGUMENTARY: No rashes. No lesions.  MUSCULOSKELETAL: Right lower extremity chronic ulcer  nEUROLOGIC: No numbness, tingling, or ataxia. No seizure-type activity.  PSYCHIATRIC: No anxiety. No insomnia. No ADD.    Vitals:   Vitals:   04/10/18 2106 04/11/18 0348 04/11/18 0436 04/11/18 0857  BP: (!) 136/57 106/64 (!) 139/57 (!) 146/60  Pulse: 94 (!) 48 92 94  Resp: (!) 22 14 18    Temp: 99.1 F (37.3 C) 98 F (36.7 C) 98.1 F (36.7 C)   TempSrc: Oral Oral    SpO2: 98%  100%   Weight:      Height:        Wt Readings from Last 3 Encounters:  04/10/18 101.2 kg  12/12/17 101.2 kg  06/13/17 98.4 kg     Intake/Output Summary (Last 24 hours) at 04/11/2018 1157 Last data filed at 04/11/2018 0649 Gross per 24 hour  Intake 128.87 ml   Output 700 ml  Net -571.13 ml    Physical Exam:   GENERAL: Pleasant-appearing in no apparent distress.  HEAD, EYES, EARS, NOSE AND THROAT: Atraumatic, normocephalic. Extraocular muscles are intact. Pupils equal and reactive to light. Sclerae anicteric. No conjunctival injection. No oro-pharyngeal erythema.  NECK: Supple. There is no jugular venous distention. No bruits, no lymphadenopathy, no thyromegaly.  HEART: Regular rate and rhythm,. No murmurs, no rubs, no clicks.  LUNGS: Clear to auscultation bilaterally. No rales or rhonchi. No wheezes.  ABDOMEN: Soft, flat, nontender, nondistended. Has good bowel sounds. No  hepatosplenomegaly appreciated.  EXTREMITIES: Right lower extremity chronic ulcer.  NEUROLOGIC: The patient is alert, awake, and oriented x3 with no focal motor or sensory deficits appreciated bilaterally.  SKIN: Moist and warm with no rashes appreciated.  Psych: Not anxious, depressed LN: No inguinal LN enlargement    Antibiotics   Anti-infectives (From admission, onward)   Start     Dose/Rate Route Frequency Ordered Stop   04/11/18 1000  cefTRIAXone (ROCEPHIN) 1 g in sodium chloride 0.9 % 100 mL IVPB     1 g 200 mL/hr over 30 Minutes Intravenous Every 24 hours 04/10/18 1427     04/11/18 0400  vancomycin (VANCOCIN) IVPB 1000 mg/200 mL premix     1,000 mg 200 mL/hr over 60 Minutes Intravenous Every 18 hours 04/10/18 1907     04/10/18 1730  vancomycin (VANCOCIN) 1,250 mg in sodium chloride 0.9 % 250 mL IVPB     1,250 mg 166.7 mL/hr over 90 Minutes Intravenous  Once 04/10/18 1700 04/10/18 2009   04/10/18 1515  vancomycin (VANCOCIN) IVPB 1000 mg/200 mL premix  Status:  Discontinued     1,000 mg 200 mL/hr over 60 Minutes Intravenous  Once 04/10/18 1504 04/10/18 1700   04/10/18 1345  cefTRIAXone (ROCEPHIN) 1 g in sodium chloride 0.9 % 100 mL IVPB     1 g 200 mL/hr over 30 Minutes Intravenous  Once 04/10/18 1343 04/10/18 1518      Medications   Scheduled Meds: .  amLODipine  10 mg Oral Daily  . aspirin EC  325 mg Oral Q breakfast  . atorvastatin  20 mg Oral q1800  . carvedilol  6.25 mg Oral BID WC  . cholecalciferol  2,000 Units Oral Daily  . clopidogrel  75 mg Oral Q breakfast  . docusate sodium  100 mg Oral BID  . enoxaparin (LOVENOX) injection  40 mg Subcutaneous Q24H  . furosemide  40 mg Intravenous BID  . insulin aspart  0-5 Units Subcutaneous QHS  . insulin aspart  0-9 Units Subcutaneous TID WC  . insulin glargine  20 Units Subcutaneous QHS  . isosorbide dinitrate  30 mg Oral Daily  . loratadine  10 mg Oral Daily  . losartan  100 mg Oral Daily  . metoprolol succinate  50 mg Oral Daily  . pantoprazole  40 mg Oral Daily  . potassium chloride SA  20 mEq Oral Daily  . pregabalin  50 mg Oral TID   Continuous Infusions: . cefTRIAXone (ROCEPHIN)  IV 1 g (04/11/18 0905)  . vancomycin 200 mL/hr at 04/11/18 0459   PRN Meds:.acetaminophen **OR** acetaminophen, albuterol, fluticasone, nitroGLYCERIN, ondansetron **OR** ondansetron (ZOFRAN) IV, polyethylene glycol   Data Review:   Micro Results Recent Results (from the past 240 hour(s))  CULTURE, BLOOD (ROUTINE X 2) w Reflex to ID Panel     Status: None (Preliminary result)   Collection Time: 04/10/18  5:18 PM  Result Value Ref Range Status   Specimen Description BLOOD L HAND  Final   Special Requests   Final    BOTTLES DRAWN AEROBIC AND ANAEROBIC Blood Culture results may not be optimal due to an inadequate volume of blood received in culture bottles   Culture   Final    NO GROWTH < 12 HOURS Performed at [facility], [address] [place], [place] [address]    Report Status PENDING  Incomplete  CULTURE, BLOOD (ROUTINE X 2) w Reflex to ID Panel     Status: None (Preliminary result)   Collection  Time: 04/10/18  6:11 PM  Result Value Ref Range Status   Specimen Description BLOOD BLOOD LEFT HAND  Final   Special Requests   Final    BOTTLES DRAWN AEROBIC ONLY Blood Culture  results may not be optimal due to an inadequate volume of blood received in culture bottles   Culture   Final    NO GROWTH < 12 HOURS Performed at [facility], [address], [place], [place] [address]    Report Status PENDING  Incomplete    Radiology Reports Ct Head Wo Contrast  Result Date: 04/10/2018 CLINICAL DATA:  77 year old female with altered mental status. EXAM: CT HEAD WITHOUT CONTRAST TECHNIQUE: Contiguous axial images were obtained from the base of the skull through the vertex without intravenous contrast. COMPARISON:  08/29/2016 CT and prior studies FINDINGS: Brain: No evidence of acute infarction, hemorrhage, hydrocephalus, extra-axial collection or mass effect. A 1 cm calcified meningioma within the RIGHT posterior fossa is unchanged and without adjacent parenchymal edema. Mild atrophy and mild chronic small-vessel white matter ischemic changes again noted. Vascular: Carotid atherosclerotic calcifications noted. Skull: Normal. Negative for fracture or focal lesion. Sinuses/Orbits: No acute finding. Other: None. IMPRESSION: 1. No evidence of acute intracranial abnormality. 2. Mild atrophy and chronic small-vessel white matter ischemic changes. 3. Stable 1 cm calcified meningioma within the RIGHT posterior fossa. Electronically Signed   By: Margarette Canada M.D.   On: 04/10/2018 12:08   Dg Chest Port 1 View  Result Date: 04/10/2018 CLINICAL DATA:  Altered mental status. EXAM: PORTABLE CHEST 1 VIEW COMPARISON:  10/30/2017. FINDINGS: Cardiomegaly with mild pulmonary vascular prominence and bilateral interstitial prominence. Findings consistent with mild CHF. Small bilateral pleural effusions. No pneumothorax. IMPRESSION: Cardiomegaly with mild pulmonary interstitial prominence consistent with mild CHF. Electronically Signed   By: Marcello Moores  Register   On: 04/10/2018 12:11   Dg Foot Complete Right  Result Date: 04/10/2018 CLINICAL DATA:  Chronic wound of the right foot. EXAM: RIGHT FOOT  COMPLETE - 3+ VIEW COMPARISON:  None. FINDINGS: No acute fracture or dislocation. Arthrodesis of the first MTP joint. Generalized osteopenia. No periosteal reaction or bone destruction. No soft tissue emphysema or radiopaque foreign body. Generalized soft tissue swelling of the right lower leg and foot. Peripheral vascular atherosclerotic disease. IMPRESSION: 1. No radiographic evidence of osteomyelitis of the right foot. Electronically Signed   By: Kathreen Devoid   On: 04/10/2018 15:28     CBC Recent Labs  Lab 04/10/18 1135 04/11/18 0355  WBC 15.3* 9.0  HGB 13.9 13.7  HCT 42.5 41.8  PLT 168 151  MCV 83.9 85.1  MCH 27.5 27.8  MCHC 32.8 32.7  RDW 17.0* 17.5*  LYMPHSABS 0.3*  --   MONOABS 0.7  --   EOSABS 0.0  --   BASOSABS 0.0  --     Chemistries  Recent Labs  Lab 04/10/18 1135 04/11/18 0355  NA 140 140  K 3.7 4.4  CL 100 103  CO2 31 31  GLUCOSE 167* 142*  BUN 13 12  CREATININE 0.90 0.70  CALCIUM 9.0 8.8*  AST 22  --   ALT 22  --   ALKPHOS 76  --   BILITOT 1.1  --    ------------------------------------------------------------------------------------------------------------------ estimated creatinine clearance is 71.7 mL/min (by C-G formula based on SCr of 0.7 mg/dL). ------------------------------------------------------------------------------------------------------------------ Recent Labs    04/11/18 0355  HGBA1C 8.8*   ------------------------------------------------------------------------------------------------------------------ No results for input(s): CHOL, HDL, LDLCALC, TRIG, CHOLHDL, LDLDIRECT in the last 72 hours. ------------------------------------------------------------------------------------------------------------------ No  results for input(s): TSH, T4TOTAL, T3FREE, THYROIDAB in the last 72 hours.  Invalid input(s): FREET3 ------------------------------------------------------------------------------------------------------------------ No  results for input(s): VITAMINB12, FOLATE, FERRITIN, TIBC, IRON, RETICCTPCT in the last 72 hours.  Coagulation profile No results for input(s): INR, PROTIME in the last 168 hours.  No results for input(s): DDIMER in the last 72 hours.  Cardiac Enzymes No results for input(s): CKMB, TROPONINI, MYOGLOBIN in the last 168 hours.  Invalid input(s): CK ------------------------------------------------------------------------------------------------------------------ Invalid input(s): [place]    1.  Acute encephalopathy with acute cystitis.  Continue IV antibiotics patient's mental status is improved await urine culture 2.  Chronic lower extremity ulcer appreciate podiatry input local wound care recommended outpatient follow-up with vascular 3.  Accelerated hypertension.    Blood pressure now improved continue current medication 4.  Acute congestive heart failure with lower extremity edema and anasarca.  Change Lasix to IV twice daily.  Obtain an echocardiogram to further assess  5 .  Type 2 diabetes mellitus continue Lantus and sliding scale insulin  6..  Neuropathy on Lyrica 7.  Obesity.  Weight loss needed 8.  History of peripheral vascular disease and stroke on aspirin and Plavix.     Code Status Orders  (From admission, onward)         Start     Ordered   04/10/18 1437  Full code  Continuous     04/10/18 1437        Code Status History    Date Active Date Inactive Code Status Order ID Comments User Context   08/29/2016 0516 09/17/2016 1938 Full Code [id number]  Etta Quill, DO ED   07/05/2016 1755 07/09/2016 2131 Full Code [id number]  Shela Leff, MD Inpatient   03/01/2016 1524 03/13/2016 2119 Full Code [id number]  [name], MD Inpatient   03/01/2016 1450 03/01/2016 1524 Full Code [id number]  Marliss Coots, PA-C Inpatient   03/01/2016 1104 03/01/2016 1450 Full Code [id number]  Roland Earl, MD Inpatient   02/28/2016 1915 03/01/2016 1104 Full  Code [id number]  Orbie Hurst Inpatient   02/12/2014 0708 02/13/2014 1850 Full Code [id number]  Oswald Hillock, MD Inpatient   02/08/2014 1317 02/10/2014 1758 Full Code [id number]  Wylene Simmer, MD Inpatient   02/03/2014 1459 02/08/2014 1317 Full Code [id number]  Radene Gunning, NP Inpatient           Consults podiatry DVT Prophylaxis  Lovenox  Lab Results  Component Value Date   PLT 151 04/11/2018     Time Spent in minutes   66min  Greater than 50% of time spent in care coordination and counseling patient regarding the condition and plan of care.   Dustin Flock M.D on 04/11/2018 at 11:57 AM  Between 7am to 6pm - Pager - [phone]  After 6pm go to [email or web address] - Proofreader  [employer]   Office  [phone]

## 2018-04-11 NOTE — Clinical Social Work Note (Signed)
Clinical Social Work Assessment  Patient Details  Name: [name] MRN: [id number] Date of Birth: [birth year]  Date of referral:  04/11/18               Reason for consult:  Facility Placement                Permission sought to share information with:  Chartered certified accountant granted to share information::     Name::        Agency::  [facility]  Relationship::     Contact Information:     Housing/Transportation Living arrangements for the past 2 months:  [place] of Information:  Patient, Spouse, Adult Children Patient Interpreter Needed:  None Criminal Activity/Legal Involvement Pertinent to Current Situation/Hospitalization:    Significant Relationships:  Church, Adult Children, Warehouse manager, Spouse, Siblings, Other Family Members, Friend Lives with:  Spouse Do you feel safe going back to the place where you live?  Yes Need for family participation in patient care:  Yes (Comment)(Patient is HOH and confused at this time)  Care giving concerns:  Probable need for SNF post-discharge   Social Worker assessment / plan:  CSW met with the patient and her family at bedside to discuss discharge planning. The patient was [facility] and intermittently confused. The patient's spouse, son, and daughter all assisted in explaining the discussion. The patient gave verbal permision for her family to speak on her behalf. The patient's family shared that they agree with SNF placement, and their preference is [facility] due to their past experiences at the facility.The CSW explained the need for prior authorization from [facility], and the family stated that they understood. The client has [facility] for home health at this time, and the family would like to arrange for home health post-SNF through [facility].   The patient is HOH, and the family requested "drops" for her ears. The CSW will advise the medical team of this  request. The CSW will rprovide bed offers as they become available and will follow for discharge facilitation.  Employment status:  Retired Nurse, adult PT Recommendations:  [facility] / Referral to community resources:  [place]  Patient/Family's Response to care:  The patient and her family thanked the CSW.  Patient/Family's Understanding of and Emotional Response to Diagnosis, Current Treatment, and Prognosis:  The patient's family understands that she has elevated care needs and agree with SNF for discharge.  Emotional Assessment Appearance:  Appears stated age Attitude/Demeanor/Rapport:  Engaged Affect (typically observed):  Appropriate, Pleasant Orientation:  Oriented to Self, Oriented to Place Alcohol / Substance use:  Never Used Psych involvement (Current and /or in the community):  No (Comment)  Discharge Needs  Concerns to be addressed:  Discharge Planning Concerns, Care Coordination Readmission within the last 30 days:  No Current discharge risk:  Chronically ill Barriers to Discharge:  Continued Medical Work up   [employer], LCSW 04/11/2018, 4:39 PM

## 2018-04-11 NOTE — Progress Notes (Signed)
   04/11/18 1100  Clinical Encounter Type  Visited With Patient  Visit Type Initial  Referral From Physician  Consult/Referral To Chaplain  Spiritual Encounters  Spiritual Needs Prayer  Stress Factors  Patient Stress Factors Health changes  Family Stress Factors Health changes  CH received an (OR) for prayer, Mrs. [name] is a 77 year old. female who was admitted due to altered mental status.  Son reports the patient has a history of CVA which is left her with some right-sided weakness.  She also has diabetes. The [place] presented to the room, and spoke with the nurse who was exiting, the nurse stated that the patient was very drowsy, The [place] entered the room, alerting the patient to whom I am and why I was there for spiritual care and prayer. Pastoral presence ensued. The patient was groggy and minimally alert, The [place] grabbed her hand and knelt bedside and offered words of encouragement, support and prayer commenced. The patient bestowed salutations of hope, encouragement and healing upon her prior to exiting.

## 2018-04-11 NOTE — Consult Note (Signed)
ORTHOPAEDIC CONSULTATION  REQUESTING PHYSICIAN: Dustin Flock, MD  Chief Complaint: Right foot ulcer  HPI: [name] is a 77 y.o. female who complains of  Chronic right foot ulcer.  Has been seen at wound center and recently in a patient clinic by Dr. Cleda Mccreedy.  Consult to evaluate wound for possible infection.  Past Medical History:  Diagnosis Date  . Acute heart failure ([place])   . Aortic stenosis   . Complication of anesthesia    Hard to wake patient up after having anesthesia  . Diabetes mellitus without complication ([place])   . Diabetic neuropathy ([facility])    Takes Lyrica  . Edema of both legs    Takes Lasix  . GERD (gastroesophageal reflux disease)   . High cholesterol   . HTN (hypertension)   . Hypertension   . PAD (peripheral artery disease) ([place])   . Shortness of breath dyspnea   . Spasm of back muscles   . Stroke ([place])   . Wound, open    Right great toe   Past Surgical History:  Procedure Laterality Date  . BYPASS GRAFT POPLITEAL TO TIBIAL Right 02/28/2016   Procedure: BYPASS GRAFT RIGHT BELOW KNEE POPLITEAL TO PERONEAL USING REVERSED RIGHT GREATER SAPHENOUS VEIN;  Surgeon: Conrad [place], MD;  Location: [facility];  Service: Vascular;  Laterality: Right;  . CYST EXCISION     Abdomen  . EYE SURGERY Bilateral    Cataract removal  . INTRAMEDULLARY (IM) NAIL INTERTROCHANTERIC Left 02/04/2014   Procedure: INTRAMEDULLARY (IM) NAIL INTERTROCHANTRIC FEMORAL;  Surgeon: Mauri Pole, MD;  Location: [facility];  Service: Orthopedics;  Laterality: Left;  . IR GENERIC HISTORICAL  03/01/2016   IR ANGIO INTRA EXTRACRAN SEL COM CAROTID INNOMINATE UNI R MOD SED 03/01/2016 [name], MD [facility]  . IR GENERIC HISTORICAL  03/01/2016   IR ENDOVASC INTRACRANIAL INF OTHER THAN THROMBO ART INC DIAG ANGIO 03/01/2016 [name], MD [facility]  . IR GENERIC HISTORICAL  03/01/2016   IR INTRAVSC STENT CERV CAROTID W/O EMB-PROT MOD SED INC ANGIO 03/01/2016 [name], MD  [facility]  . IR GENERIC HISTORICAL  06/03/2016   IR RADIOLOGIST EVAL & MGMT 06/03/2016 [facility]  . ORIF TOE FRACTURE Right 02/08/2014   Procedure: OPEN REDUCTION INTERNAL FIXATION Right METATARSAL  FRACTURE ;  Surgeon: Wylene Simmer, MD;  Location: [facility];  Service: Orthopedics;  Laterality: Right;  . PERIPHERAL VASCULAR CATHETERIZATION N/A 12/28/2015   Procedure: Abdominal Aortogram w/Lower Extremity;  Surgeon: Conrad Oaks, MD;  Location: [facility];  Service: Cardiovascular;  Laterality: N/A;  . RADIOLOGY WITH ANESTHESIA N/A 03/01/2016   Procedure: RADIOLOGY WITH ANESTHESIA;  Surgeon: [name], MD;  Location: [facility];  Service: Radiology;  Laterality: N/A;  . VEIN HARVEST Right 02/28/2016   Procedure: RIGHT GREATER SAPHENOUS VEIN HARVEST;  Surgeon: Conrad Leland, MD;  Location: [place] [facility];  Service: Vascular;  Laterality: Right;   Social History   Socioeconomic History  . Marital status: Married    Spouse name: Not on file  . Number of children: Not on file  . Years of education: Not on file  . Highest education level: Not on file  Occupational History  . Not on file  Social Needs  . Financial resource strain: Not on file  . Food insecurity:    Worry: Not on file    Inability: Not on file  . Transportation needs:    Medical: Not on file    Non-medical: Not on  file  Tobacco Use  . Smoking status: Never Smoker  . Smokeless tobacco: Never Used  . Tobacco comment: Never smoked  Substance and Sexual Activity  . Alcohol use: No    Alcohol/week: 0.0 standard drinks  . Drug use: No  . Sexual activity: Never  Lifestyle  . Physical activity:    Days per week: Not on file    Minutes per session: Not on file  . Stress: Not on file  Relationships  . Social connections:    Talks on phone: Not on file    Gets together: Not on file    Attends religious service: Not on file    Active member of club or organization: Not on file    Attends meetings of clubs or  organizations: Not on file    Relationship status: Not on file  Other Topics Concern  . Not on file  Social History Narrative  . Not on file   Family History  Problem Relation Age of Onset  . Diabetes Other   . Liver disease Mother   . CVA Father   . Diabetes Father   . Hypertension Father    Allergies  Allergen Reactions  . Iodine Anaphylaxis  . Penicillins Anaphylaxis    Rec'd cefazolin 02/04/14 pre-op for femur ORIF Has patient had a PCN reaction causing immediate rash, facial/tongue/throat swelling, SOB or lightheadedness with hypotension: Unknown Has patient had a PCN reaction causing severe rash involving mucus membranes or skin necrosis: Unknown Has patient had a PCN reaction that required hospitalization: Unknown Has patient had a PCN reaction occurring within the last 10 years: Unknown If all of the above answers are "NO", then may proceed with Cephalosporin u  . Shellfish Allergy Anaphylaxis  . Shellfish-Derived Products Anaphylaxis  . Sulfa Antibiotics Anaphylaxis  . Sulfacetamide Sodium Anaphylaxis  . Sulfasalazine Anaphylaxis  . Atorvastatin     Other reaction(s): Unknown  . Morphine And Related Other (See Comments)    Altered mental status   Prior to Admission medications   Medication Sig Start Date End Date Taking? Authorizing Provider  acetaminophen (TYLENOL) 500 MG tablet Take 1,000 mg by mouth every 6 (six) hours as needed for mild pain.   Yes [provider]  albuterol (PROVENTIL HFA;VENTOLIN HFA) 108 (90 Base) MCG/ACT inhaler Inhale 2 puffs into the lungs every 6 (six) hours as needed.   Yes [provider]  amLODipine (NORVASC) 10 MG tablet Take 1 tablet (10 mg total) by mouth daily. 03/13/16  Yes Alvia Grove, PA-C  aspirin 325 MG tablet Take 1 tablet (325 mg total) by mouth daily with breakfast. 07/10/16  Yes Holley Raring, MD  atorvastatin (LIPITOR) 20 MG tablet Take 1 tablet (20 mg total) by mouth daily. 03/13/16  Yes Alvia Grove, PA-C  carvedilol (COREG) 6.25 MG tablet Take 6.25 mg by mouth 2 (two) times daily with a meal.  02/06/17  Yes [provider]  cetirizine (ZYRTEC) 10 MG tablet Take 10 mg by mouth daily.    Yes [provider]  cholecalciferol (VITAMIN D) 1000 units tablet Take 2,000 Units by mouth daily.    Yes [provider]  clopidogrel (PLAVIX) 75 MG tablet Take 1 tablet (75 mg total) by mouth daily with breakfast. 03/04/17  Yes Conrad Anderson, MD  docusate sodium (COLACE) 100 MG capsule Take 100 mg by mouth 2 (two) times daily.   Yes [provider]  fluticasone (FLONASE) 50 MCG/ACT nasal spray Place 1 spray into both  nostrils daily.   Yes [provider]  insulin aspart (NOVOLOG) 100 UNIT/ML injection Inject 0-15 Units into the skin 3 (three) times daily with meals. Before each meal 3 times a day, 140-199 - 3 units, 200-250 - 5 units, 251-299 - 8 units,  300-349 - 12 units,  350 or above 15 units. Patient taking differently: Inject 0-15 Units into the skin 3 (three) times daily with meals.  02/09/14  Yes Thurnell Lose, MD  insulin glargine (LANTUS) 100 UNIT/ML injection Inject 0.27 mLs (27 Units total) into the skin at bedtime. Patient taking differently: Inject 20-28 Units into the skin 2 (two) times daily. Take 20 units in the morning and 28 units at bedtime 09/16/16  Yes Reyne Dumas, MD  isosorbide dinitrate (ISORDIL) 30 MG tablet Take 30 mg by mouth daily.  04/16/17  Yes [provider]  nitroGLYCERIN (NITROSTAT) 0.4 MG SL tablet Place 1 tablet (0.4 mg total) under the tongue every 5 (five) minutes as needed for chest pain. 07/09/16  Yes Holley Raring, MD  polyethylene glycol (MIRALAX / GLYCOLAX) packet Take 17 g by mouth 2 (two) times daily. Patient taking differently: Take 17 g by mouth daily as needed for mild constipation.  02/09/14  Yes Thurnell Lose, MD  potassium chloride SA (K-DUR,KLOR-CON) 20 MEQ tablet Take 20 mEq by mouth daily.   Yes  [provider]  pregabalin (LYRICA) 50 MG capsule Take 50 mg by mouth at bedtime.    Yes [provider]  pregabalin (LYRICA) 50 MG capsule Take 50 mg by mouth 3 (three) times daily.    Yes [provider]  torsemide (DEMADEX) 20 MG tablet Take 40 mg by mouth 3 (three) times daily.  04/17/17  Yes [provider]  VITAMIN E PO Take 1 capsule by mouth daily.   Yes [provider]  furosemide (LASIX) 40 MG tablet Take 1 tablet (40 mg total) by mouth 2 (two) times daily. Patient not taking: Reported on 04/10/2018 09/16/16 10/16/16  Reyne Dumas, MD   Ct Head Wo Contrast  Result Date: 04/10/2018 CLINICAL DATA:  77 year old female with altered mental status. EXAM: CT HEAD WITHOUT CONTRAST TECHNIQUE: Contiguous axial images were obtained from the base of the skull through the vertex without intravenous contrast. COMPARISON:  08/29/2016 CT and prior studies FINDINGS: Brain: No evidence of acute infarction, hemorrhage, hydrocephalus, extra-axial collection or mass effect. A 1 cm calcified meningioma within the RIGHT posterior fossa is unchanged and without adjacent parenchymal edema. Mild atrophy and mild chronic small-vessel white matter ischemic changes again noted. Vascular: Carotid atherosclerotic calcifications noted. Skull: Normal. Negative for fracture or focal lesion. Sinuses/Orbits: No acute finding. Other: None. IMPRESSION: 1. No evidence of acute intracranial abnormality. 2. Mild atrophy and chronic small-vessel white matter ischemic changes. 3. Stable 1 cm calcified meningioma within the RIGHT posterior fossa. Electronically Signed   By: Margarette Canada M.D.   On: 04/10/2018 12:08   Dg Chest Port 1 View  Result Date: 04/10/2018 CLINICAL DATA:  Altered mental status. EXAM: PORTABLE CHEST 1 VIEW COMPARISON:  10/30/2017. FINDINGS: Cardiomegaly with mild pulmonary vascular prominence and bilateral interstitial prominence. Findings consistent with mild CHF. Small  bilateral pleural effusions. No pneumothorax. IMPRESSION: Cardiomegaly with mild pulmonary interstitial prominence consistent with mild CHF. Electronically Signed   By: Marcello Moores  Register   On: 04/10/2018 12:11   Dg Foot Complete Right  Result Date: 04/10/2018 CLINICAL DATA:  Chronic wound of the right foot. EXAM: RIGHT FOOT COMPLETE - 3+ VIEW  COMPARISON:  None. FINDINGS: No acute fracture or dislocation. Arthrodesis of the first MTP joint. Generalized osteopenia. No periosteal reaction or bone destruction. No soft tissue emphysema or radiopaque foreign body. Generalized soft tissue swelling of the right lower leg and foot. Peripheral vascular atherosclerotic disease. IMPRESSION: 1. No radiographic evidence of osteomyelitis of the right foot. Electronically Signed   By: Kathreen Devoid   On: 04/10/2018 15:28    Positive ROS: All other systems have been reviewed and were otherwise negative with the exception of those mentioned in the HPI and as above.  12 point ROS was performed.  Physical Exam: General: Alert and oriented.  No apparent distress.  Vascular:  Left foot:Dorsalis Pedis:  absent Posterior Tibial:  absent  Right foot: Dorsalis Pedis:  absent Posterior Tibial:  absent  Neuro:absent protective sensation  Derm: Left foot without ulceration.  Dorsal right foot along the first metatarsophalangeal joint has ulceration through epidermis and dermal layer.  No obvious subcutaneous tissue or tendon at this time.  Mixed fibrotic granular tissue base.  Measures approximately 2 x 1 cm.  No active drainage.  Mild darkened discoloration of the surrounding skin without necrosis at this time.  No cellulitic or lymphangitic streaking.  No foul odor.  No purulence.  Ortho/MS: Edema to bilateral lower extremities.  No range of motion to the right first MTPJ secondarily to 1st MTPJ fusion as is demonstrated on x-ray.  Assessment: Full-thickness ulcer dorsal right first MTPJ. Chronic peripheral vascular  disease. Diabetes with peripheral vascular disease and neuropathy.  Plan: This does not appear to be an infected wound.  X-rays are negative for gas in the soft tissue.  First MTPJ fusion is noted.  This could be autofusion or remote history of surgery.  Patient was not able to provide very good review of systems or history today.  She was arousable and had difficulty hearing.  Patient has been seen by vascular surgery in the past outpatient.  He is undergone revascularization of the left lower extremity.  Per Dr. Cleda Mccreedy in the outpatient clinic note vascular surgery has recommended revascularization of the right lower extremity but patient has refused in the past.  Patient should follow-up with vascular surgery in the outpatient clinic.  For now will have dressings applied to the dorsal foot wound.  Follow-up with podiatry in the outpatient clinic in 4 to 6 weeks.  Patient has been seen by wound care on a regular basis.    Elesa Hacker, DPM Cell [phone]   04/11/2018 10:43 AM

## 2018-04-12 ENCOUNTER — Inpatient Hospital Stay: Payer: Medicare HMO

## 2018-04-12 DIAGNOSIS — J9601 Acute respiratory failure with hypoxia: Secondary | ICD-10-CM

## 2018-04-12 LAB — CBC WITH DIFFERENTIAL/PLATELET
BASOS ABS: 0 10*3/uL (ref 0–0.1)
BASOS PCT: 0 %
EOS ABS: 0 10*3/uL (ref 0–0.7)
Eosinophils Relative: 0 %
HEMATOCRIT: 37.9 % (ref 35.0–47.0)
HEMOGLOBIN: 12.1 g/dL (ref 12.0–16.0)
Lymphocytes Relative: 7 %
Lymphs Abs: 0.6 10*3/uL — ABNORMAL LOW (ref 1.0–3.6)
MCH: 27.5 pg (ref 26.0–34.0)
MCHC: 31.9 g/dL — ABNORMAL LOW (ref 32.0–36.0)
MCV: 86.5 fL (ref 80.0–100.0)
Monocytes Absolute: 0.8 10*3/uL (ref 0.2–0.9)
Monocytes Relative: 10 %
NEUTROS ABS: 7.3 10*3/uL — AB (ref 1.4–6.5)
Neutrophils Relative %: 83 %
Platelets: 121 10*3/uL — ABNORMAL LOW (ref 150–440)
RBC: 4.39 MIL/uL (ref 3.80–5.20)
RDW: 17.8 % — ABNORMAL HIGH (ref 11.5–14.5)
WBC: 8.8 10*3/uL (ref 3.6–11.0)

## 2018-04-12 LAB — GLUCOSE, CAPILLARY
GLUCOSE-CAPILLARY: 240 mg/dL — AB (ref 70–99)
GLUCOSE-CAPILLARY: 243 mg/dL — AB (ref 70–99)
GLUCOSE-CAPILLARY: 212 mg/dL — AB (ref 70–99)
Glucose-Capillary: 213 mg/dL — ABNORMAL HIGH (ref 70–99)
Glucose-Capillary: 243 mg/dL — ABNORMAL HIGH (ref 70–99)

## 2018-04-12 LAB — BLOOD GAS, ARTERIAL
ACID-BASE EXCESS: 5.2 mmol/L — AB (ref 0.0–2.0)
Acid-Base Excess: 5.6 mmol/L — ABNORMAL HIGH (ref 0.0–2.0)
Acid-Base Excess: 9.1 mmol/L — ABNORMAL HIGH (ref 0.0–2.0)
BICARBONATE: 35.1 mmol/L — AB (ref 20.0–28.0)
BICARBONATE: 38.4 mmol/L — AB (ref 20.0–28.0)
Bicarbonate: 37 mmol/L — ABNORMAL HIGH (ref 20.0–28.0)
Delivery systems: POSITIVE
Expiratory PAP: 5
FIO2: 0.3
FIO2: 0.3
FIO2: 0.28
Inspiratory PAP: 12
O2 SAT: 79.8 %
O2 Saturation: 91.8 %
O2 Saturation: 78.2 %
PATIENT TEMPERATURE: 37
PATIENT TEMPERATURE: 37
PCO2 ART: 99 mmHg — AB (ref 32.0–48.0)
PCO2 ART: 80 mmHg — AB (ref 32.0–48.0)
PCO2 ART: 78 mmHg — AB (ref 32.0–48.0)
PH ART: 7.18 — AB (ref 7.350–7.450)
PO2 ART: 78 mmHg — AB (ref 83.0–108.0)
PO2 ART: 49 mmHg — AB (ref 83.0–108.0)
Patient temperature: 37
pH, Arterial: 7.25 — ABNORMAL LOW (ref 7.350–7.450)
pH, Arterial: 7.3 — ABNORMAL LOW (ref 7.350–7.450)
pO2, Arterial: 50 mmHg — ABNORMAL LOW (ref 83.0–108.0)

## 2018-04-12 LAB — COMPREHENSIVE METABOLIC PANEL
ALK PHOS: 54 U/L (ref 38–126)
ALT: 25 U/L (ref 0–44)
ANION GAP: 7 (ref 5–15)
AST: 21 U/L (ref 15–41)
Albumin: 2.7 g/dL — ABNORMAL LOW (ref 3.5–5.0)
BUN: 14 mg/dL (ref 8–23)
CALCIUM: 8.8 mg/dL — AB (ref 8.9–10.3)
CHLORIDE: 100 mmol/L (ref 98–111)
CO2: 32 mmol/L (ref 22–32)
CREATININE: 0.81 mg/dL (ref 0.44–1.00)
Glucose, Bld: 257 mg/dL — ABNORMAL HIGH (ref 70–99)
Potassium: 5 mmol/L (ref 3.5–5.1)
SODIUM: 139 mmol/L (ref 135–145)
Total Bilirubin: 0.6 mg/dL (ref 0.3–1.2)
Total Protein: 5.7 g/dL — ABNORMAL LOW (ref 6.5–8.1)

## 2018-04-12 LAB — AMMONIA: Ammonia: 58 umol/L — ABNORMAL HIGH (ref 9–35)

## 2018-04-12 MED ORDER — LACTULOSE ENEMA
300.0000 mL | Freq: Two times a day (BID) | ORAL | Status: DC
  Administered 2018-04-12: 300 mL via RECTAL
  Filled 2018-04-12: qty 300

## 2018-04-12 MED ORDER — SODIUM CHLORIDE 0.9 % IV SOLN
INTRAVENOUS | Status: DC
  Administered 2018-04-12: 10:00:00 via INTRAVENOUS

## 2018-04-12 MED ORDER — LORAZEPAM 2 MG/ML IJ SOLN
0.5000 mg | INTRAMUSCULAR | Status: DC | PRN
  Administered 2018-04-12 – 2018-04-14 (×3): 0.5 mg via INTRAVENOUS
  Filled 2018-04-12 (×3): qty 1

## 2018-04-12 MED ORDER — SODIUM CHLORIDE 0.9 % IV SOLN
INTRAVENOUS | Status: DC | PRN
  Administered 2018-04-13: 10 mL via INTRAVENOUS

## 2018-04-12 MED ORDER — LACTULOSE 10 GM/15ML PO SOLN: 30.0000 g | Freq: Two times a day (BID) | ORAL | Status: DC

## 2018-04-12 MED ORDER — LACTULOSE 10 GM/15ML PO SOLN
30.0000 g | Freq: Two times a day (BID) | ORAL | Status: DC
  Administered 2018-04-12: 30 g via ORAL
  Filled 2018-04-12 (×2): qty 60

## 2018-04-12 MED ORDER — FUROSEMIDE 10 MG/ML IJ SOLN
40.0000 mg | Freq: Once | INTRAMUSCULAR | Status: AC
  Administered 2018-04-12: 40 mg via INTRAVENOUS
  Filled 2018-04-12: qty 4

## 2018-04-12 NOTE — Progress Notes (Signed)
[place] at [facility]                                                                                                                                                                                  Patient Demographics   [name], is a 77 y.o. female, DOB - 19-Sep-1940, QQP:[id number]  Admit date - 04/10/2018   Admitting Physician Loletha Grayer, MD  Outpatient Primary MD for the patient is Adamo, Hattie Perch, MD   LOS - 2  Subjective: Patient continues to be lethargic her eyes are open but unable to communicate Patient was able to communicate yesterday   Review of Systems:   CONSTITUTIONAL: Unable to provide   Vitals:   Vitals:   04/11/18 2035 04/11/18 2215 04/12/18 0538 04/12/18 0805  BP: (!) 142/59  (!) 147/54 (!) 157/64  Pulse: 97  83 94  Resp: (!) 24  (!) 21   Temp: (!) 103 F (39.4 C) 99.1 F (37.3 C) 98 F (36.7 C) 99.8 F (37.7 C)  TempSrc: Oral Oral  Oral  SpO2: 99%  97% 100%  Weight:   107.5 kg   Height:        Wt Readings from Last 3 Encounters:  04/12/18 107.5 kg  12/12/17 101.2 kg  06/13/17 98.4 kg     Intake/Output Summary (Last 24 hours) at 04/12/2018 1133 Last data filed at 04/12/2018 0636 Gross per 24 hour  Intake 463.66 ml  Output 750 ml  Net -286.34 ml    Physical Exam:   GENERAL: Ill-appearing HEAD, EYES, EARS, NOSE AND THROAT: Atraumatic, normocephalic. . Pupils equal and reactive to light. Sclerae anicteric. No conjunctival injection. No oro-pharyngeal erythema.  NECK: Supple. There is no jugular venous distention. No bruits, no lymphadenopathy, no thyromegaly.  HEART: Regular rate and rhythm,. No murmurs, no rubs, no clicks.  LUNGS: Clear to auscultation bilaterally. No rales or rhonchi. No wheezes.  ABDOMEN: Soft, flat, nontender, nondistended. Has good bowel sounds. No hepatosplenomegaly appreciated.  EXTREMITIES: Right lower extremity chronic ulcer.  NEUROLOGIC: She is lethargic, able to open eyes but  unable to communicate SKIN: Moist and warm with no rashes appreciated.  Psych: Not anxious, depressed LN: No inguinal LN enlargement    Antibiotics   Anti-infectives (From admission, onward)   Start     Dose/Rate Route Frequency Ordered Stop   04/11/18 1000  cefTRIAXone (ROCEPHIN) 1 g in sodium chloride 0.9 % 100 mL IVPB     1 g 200 mL/hr over 30 Minutes Intravenous Every 24 hours 04/10/18 1427     04/11/18 0400  vancomycin (VANCOCIN) IVPB 1000 mg/200 mL premix     1,000 mg 200 mL/hr  over 60 Minutes Intravenous Every 18 hours 04/10/18 1907     04/10/18 1730  vancomycin (VANCOCIN) 1,250 mg in sodium chloride 0.9 % 250 mL IVPB     1,250 mg 166.7 mL/hr over 90 Minutes Intravenous  Once 04/10/18 1700 04/10/18 2009   04/10/18 1515  vancomycin (VANCOCIN) IVPB 1000 mg/200 mL premix  Status:  Discontinued     1,000 mg 200 mL/hr over 60 Minutes Intravenous  Once 04/10/18 1504 04/10/18 1700   04/10/18 1345  cefTRIAXone (ROCEPHIN) 1 g in sodium chloride 0.9 % 100 mL IVPB     1 g 200 mL/hr over 30 Minutes Intravenous  Once 04/10/18 1343 04/10/18 1518      Medications   Scheduled Meds: . amLODipine  10 mg Oral Daily  . aspirin EC  325 mg Oral Q breakfast  . atorvastatin  20 mg Oral q1800  . carvedilol  6.25 mg Oral BID WC  . cholecalciferol  2,000 Units Oral Daily  . clopidogrel  75 mg Oral Q breakfast  . docusate sodium  100 mg Oral BID  . enoxaparin (LOVENOX) injection  40 mg Subcutaneous Q24H  . insulin aspart  0-5 Units Subcutaneous QHS  . insulin aspart  0-9 Units Subcutaneous TID WC  . insulin glargine  20 Units Subcutaneous QHS  . isosorbide dinitrate  30 mg Oral Daily  . loratadine  10 mg Oral Daily  . losartan  100 mg Oral Daily  . metoprolol succinate  50 mg Oral Daily  . pantoprazole  40 mg Oral Daily  . potassium chloride SA  20 mEq Oral Daily   Continuous Infusions: . sodium chloride 75 mL/hr at 04/12/18 1022  . cefTRIAXone (ROCEPHIN)  IV 1 g (04/12/18 0831)  .  vancomycin Stopped (04/11/18 2214)   PRN Meds:.acetaminophen **OR** acetaminophen, albuterol, fluticasone, nitroGLYCERIN, ondansetron **OR** ondansetron (ZOFRAN) IV, polyethylene glycol   Data Review:   Micro Results Recent Results (from the past 240 hour(s))  Urine Culture     Status: Abnormal (Preliminary result)   Collection Time: 04/10/18 12:41 PM  Result Value Ref Range Status   Specimen Description   Final    URINE, RANDOM Performed at [facility], [address] [place], [place] [address]    Special Requests   Final    NONE Performed at [facility], [address] [place], [place] [address]    Culture 50,000 COLONIES/mL GRAM NEGATIVE RODS (A)  Final   Report Status PENDING  Incomplete  CULTURE, BLOOD (ROUTINE X 2) w Reflex to ID Panel     Status: None (Preliminary result)   Collection Time: 04/10/18  5:18 PM  Result Value Ref Range Status   Specimen Description BLOOD L HAND  Final   Special Requests   Final    BOTTLES DRAWN AEROBIC AND ANAEROBIC Blood Culture results may not be optimal due to an inadequate volume of blood received in culture bottles   Culture   Final    NO GROWTH 2 DAYS Performed at [facility], [address], [place], [place] [address]    Report Status PENDING  Incomplete  CULTURE, BLOOD (ROUTINE X 2) w Reflex to ID Panel     Status: None (Preliminary result)   Collection Time: 04/10/18  6:11 PM  Result Value Ref Range Status   Specimen Description BLOOD BLOOD LEFT HAND  Final   Special Requests   Final    BOTTLES DRAWN AEROBIC ONLY Blood Culture results may not be optimal due to  an inadequate volume of blood received in culture bottles   Culture   Final    NO GROWTH 2 DAYS Performed at [facility], [address] [place], [place] [address]    Report Status PENDING  Incomplete    Radiology Reports Ct Head Wo Contrast  Result Date: 04/10/2018 CLINICAL DATA:  77 year old female with altered mental  status. EXAM: CT HEAD WITHOUT CONTRAST TECHNIQUE: Contiguous axial images were obtained from the base of the skull through the vertex without intravenous contrast. COMPARISON:  08/29/2016 CT and prior studies FINDINGS: Brain: No evidence of acute infarction, hemorrhage, hydrocephalus, extra-axial collection or mass effect. A 1 cm calcified meningioma within the RIGHT posterior fossa is unchanged and without adjacent parenchymal edema. Mild atrophy and mild chronic small-vessel white matter ischemic changes again noted. Vascular: Carotid atherosclerotic calcifications noted. Skull: Normal. Negative for fracture or focal lesion. Sinuses/Orbits: No acute finding. Other: None. IMPRESSION: 1. No evidence of acute intracranial abnormality. 2. Mild atrophy and chronic small-vessel white matter ischemic changes. 3. Stable 1 cm calcified meningioma within the RIGHT posterior fossa. Electronically Signed   By: Margarette Canada M.D.   On: 04/10/2018 12:08   Dg Chest Port 1 View  Result Date: 04/10/2018 CLINICAL DATA:  Altered mental status. EXAM: PORTABLE CHEST 1 VIEW COMPARISON:  10/30/2017. FINDINGS: Cardiomegaly with mild pulmonary vascular prominence and bilateral interstitial prominence. Findings consistent with mild CHF. Small bilateral pleural effusions. No pneumothorax. IMPRESSION: Cardiomegaly with mild pulmonary interstitial prominence consistent with mild CHF. Electronically Signed   By: Marcello Moores  Register   On: 04/10/2018 12:11   Dg Foot Complete Right  Result Date: 04/10/2018 CLINICAL DATA:  Chronic wound of the right foot. EXAM: RIGHT FOOT COMPLETE - 3+ VIEW COMPARISON:  None. FINDINGS: No acute fracture or dislocation. Arthrodesis of the first MTP joint. Generalized osteopenia. No periosteal reaction or bone destruction. No soft tissue emphysema or radiopaque foreign body. Generalized soft tissue swelling of the right lower leg and foot. Peripheral vascular atherosclerotic disease. IMPRESSION: 1. No radiographic  evidence of osteomyelitis of the right foot. Electronically Signed   By: Kathreen Devoid   On: 04/10/2018 15:28     CBC Recent Labs  Lab 04/10/18 1135 04/11/18 0355 04/12/18 1025  WBC 15.3* 9.0 8.8  HGB 13.9 13.7 12.1  HCT 42.5 41.8 37.9  PLT 168 151 121*  MCV 83.9 85.1 86.5  MCH 27.5 27.8 27.5  MCHC 32.8 32.7 31.9*  RDW 17.0* 17.5* 17.8*  LYMPHSABS 0.3*  --  0.6*  MONOABS 0.7  --  0.8  EOSABS 0.0  --  0.0  BASOSABS 0.0  --  0.0    Chemistries  Recent Labs  Lab 04/10/18 1135 04/11/18 0355 04/12/18 1025  NA 140 140 139  K 3.7 4.4 5.0  CL 100 103 100  CO2 31 31 32  GLUCOSE 167* 142* 257*  BUN 13 12 14   CREATININE 0.90 0.70 0.81  CALCIUM 9.0 8.8* 8.8*  AST 22  --  21  ALT 22  --  25  ALKPHOS 76  --  54  BILITOT 1.1  --  0.6   ------------------------------------------------------------------------------------------------------------------ estimated creatinine clearance is 73.1 mL/min (by C-G formula based on SCr of 0.81 mg/dL). ------------------------------------------------------------------------------------------------------------------ Recent Labs    04/11/18 0355  HGBA1C 8.8*   ------------------------------------------------------------------------------------------------------------------ No results for input(s): CHOL, HDL, LDLCALC, TRIG, CHOLHDL, LDLDIRECT in the last 72 hours. ------------------------------------------------------------------------------------------------------------------ No results for input(s): TSH, T4TOTAL, T3FREE, THYROIDAB in the last 72 hours.  Invalid input(s): FREET3 ------------------------------------------------------------------------------------------------------------------  No results for input(s): VITAMINB12, FOLATE, FERRITIN, TIBC, IRON, RETICCTPCT in the last 72 hours.  Coagulation profile No results for input(s): INR, PROTIME in the last 168 hours.  No results for input(s): DDIMER in the last 72 hours.  Cardiac  Enzymes No results for input(s): CKMB, TROPONINI, MYOGLOBIN in the last 168 hours.  Invalid input(s): CK ------------------------------------------------------------------------------------------------------------------ Invalid input(s): [place]    1.  Acute encephalopathy due to hypercarbic respiratory failure Start BiPAP, I have discussed the case with intensivist to transfer to the stepdown I have updated patient's husband Her cystitis is not severe enough to cause encephalopathy likely she had hypercarbic respiratory failure from admission 2.  Chronic lower extremity ulcer appreciate podiatry input local wound care recommended outpatient follow-up with vascular, no evidence of infection 3.  Accelerated hypertension.    Blood pressure now improved continue current medication 4.  Acute congestive heart failure currently compensated patient appears little dehydrated we will try some fluids 5 .  Type 2 diabetes mellitus continue Lantus and sliding scale insulin sugars are currently elevated continue current therapy 6..  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  Obesity.  Weight loss needed 8.  History of peripheral vascular disease and stroke on aspirin and Plavix.     Code Status Orders  (From admission, onward)         Start     Ordered   04/10/18 1437  Full code  Continuous     04/10/18 1437        Code Status History    Date Active Date Inactive Code Status Order ID Comments User Context   08/29/2016 0516 09/17/2016 1938 Full Code [id number]  Etta Quill, DO ED   07/05/2016 1755 07/09/2016 2131 Full Code [id number]  Shela Leff, MD Inpatient   03/01/2016 1524 03/13/2016 2119 Full Code [id number]  [name], MD Inpatient   03/01/2016 1450 03/01/2016 1524 Full Code [id number]  Marliss Coots, PA-C Inpatient   03/01/2016 1104 03/01/2016 1450 Full Code [id number]  Roland Earl, MD Inpatient   02/28/2016 1915 03/01/2016 1104 Full Code  [id number]  Orbie Hurst Inpatient   02/12/2014 0708 02/13/2014 1850 Full Code [id number]  Oswald Hillock, MD Inpatient   02/08/2014 1317 02/10/2014 1758 Full Code [id number]  Wylene Simmer, MD Inpatient   02/03/2014 1459 02/08/2014 1317 Full Code [id number]  Radene Gunning, NP Inpatient           Consults podiatry DVT Prophylaxis  Lovenox  Lab Results  Component Value Date   PLT 121 (L) 04/12/2018     Time Spent in minutes   45 minutes of critical care time spent  Greater than 50% of time spent in care coordination and counseling patient regarding the condition and plan of care.   Dustin Flock M.D on 04/12/2018 at 11:33 AM  Between 7am to 6pm - Pager - [phone]  After 6pm go to [email or web address] - Proofreader  [employer]   Office  [phone]

## 2018-04-12 NOTE — Progress Notes (Signed)
Received report from Megan, RN

## 2018-04-12 NOTE — Progress Notes (Signed)
Dr Soyla Murphy had increased Ipap 12

## 2018-04-12 NOTE — Progress Notes (Signed)
PT arrived in ICU on [place] 2L. SPO2 @ 91%. And NS @ 75ML/hr. Pt is confused and minimally responsive, following commands and opens eyes to speech. RRT placed Pt on BIPAP at 35% SPO2 increased to 96%. Will continue to monitor.

## 2018-04-12 NOTE — Progress Notes (Signed)
Temp rechecked on 04/11/2018 at 2215. Temp 99.1 at recheck.

## 2018-04-12 NOTE — Progress Notes (Signed)
Administered lactulose  enema (see Mar), enema not retained for 60 mins as ordered. Notified Dr, Soyla Murphy. See new orders.

## 2018-04-12 NOTE — Progress Notes (Signed)
Increased FIo2 due to PAO2 of 50 on ABG

## 2018-04-12 NOTE — Consult Note (Addendum)
Name: [name] MRN: [id number] DOB: 1940-11-19     CONSULTATION DATE: 04/10/2018    HISTORY OF PRESENT ILLNESS:  77 years old lady with history of diabetes mellitus, diabetic neuropathy, aortic stenosis, heart failure, dyslipidemia, hypertension, peripheral artery disease status post right popliteal to peroneal bypass, CVA, GERD and chronic ulcer right big toe.  Patient is admitted to the hospital with altered mental status and diagnosis of acute encephalopathy.  Earlier today she was noticed to be more lethargic and able to communicate with was unchanged from yesterday when she was able to communicate as per Dr. Posey Pronto progress note.  EEG was remarkable for acute hypercapnic respiratory failure, ICU transfer was requested because of requirement to titrate BiPAP Patient arrived to ICU somnolent, arousable to touch, on a BiPAP setting of 10/5 with low tidal volume between 150 to 200.  Facemask was probably fitted on the face, delta gap was increased to 7 with improvement of tidal volume to range between 450-600. All history was obtained from the hospitalist and EMR. PAST MEDICAL HISTORY :   has a past medical history of Acute heart failure ([place]), Aortic stenosis, Complication of anesthesia, Diabetes mellitus without complication ([place]), Diabetic neuropathy ([place]), Edema of both legs, GERD (gastroesophageal reflux disease), High cholesterol, HTN (hypertension), Hypertension, PAD (peripheral artery disease) ([place]), Shortness of breath dyspnea, Spasm of back muscles, Stroke ([place]), and Wound, open.  has a past surgical history that includes Intramedullary (im) nail intertrochanteric (Left, 02/04/2014); ORIF toe fracture (Right, 02/08/2014); Cardiac catheterization (N/A, 12/28/2015); Cyst excision; Eye surgery (Bilateral); Bypass graft popliteal to tibial (Right, 02/28/2016); Vein harvest (Right, 02/28/2016); Radiology with anesthesia (N/A, 03/01/2016); ir generic historical (03/01/2016); ir generic historical  (03/01/2016); ir generic historical (03/01/2016); and ir generic historical (06/03/2016). Prior to Admission medications   Medication Sig Start Date End Date Taking? Authorizing Provider  acetaminophen (TYLENOL) 500 MG tablet Take 1,000 mg by mouth every 6 (six) hours as needed for mild pain.   Yes [provider]  albuterol (PROVENTIL HFA;VENTOLIN HFA) 108 (90 Base) MCG/ACT inhaler Inhale 2 puffs into the lungs every 6 (six) hours as needed.   Yes [provider]  amLODipine (NORVASC) 10 MG tablet Take 1 tablet (10 mg total) by mouth daily. 03/13/16  Yes Alvia Grove, PA-C  aspirin 325 MG tablet Take 1 tablet (325 mg total) by mouth daily with breakfast. 07/10/16  Yes Holley Raring, MD  atorvastatin (LIPITOR) 20 MG tablet Take 1 tablet (20 mg total) by mouth daily. 03/13/16  Yes Alvia Grove, PA-C  carvedilol (COREG) 6.25 MG tablet Take 6.25 mg by mouth 2 (two) times daily with a meal.  02/06/17  Yes [provider]  cetirizine (ZYRTEC) 10 MG tablet Take 10 mg by mouth daily.    Yes [provider]  cholecalciferol (VITAMIN D) 1000 units tablet Take 2,000 Units by mouth daily.    Yes [provider]  clopidogrel (PLAVIX) 75 MG tablet Take 1 tablet (75 mg total) by mouth daily with breakfast. 03/04/17  Yes Conrad , MD  docusate sodium (COLACE) 100 MG capsule Take 100 mg by mouth 2 (two) times daily.   Yes [provider]  fluticasone (FLONASE) 50 MCG/ACT nasal spray Place 1 spray into both nostrils daily.   Yes [provider]  insulin aspart (NOVOLOG) 100 UNIT/ML injection Inject 0-15 Units into the skin 3 (three) times daily with meals. Before each meal 3 times a day, 140-199 - 3 units, 200-250 - 5 units,  251-299 - 8 units,  300-349 - 12 units,  350 or above 15 units. Patient taking differently: Inject 0-15 Units into the skin 3 (three) times daily with meals.  02/09/14  Yes Thurnell Lose, MD  insulin glargine (LANTUS) 100  UNIT/ML injection Inject 0.27 mLs (27 Units total) into the skin at bedtime. Patient taking differently: Inject 20-28 Units into the skin 2 (two) times daily. Take 20 units in the morning and 28 units at bedtime 09/16/16  Yes Reyne Dumas, MD  isosorbide dinitrate (ISORDIL) 30 MG tablet Take 30 mg by mouth daily.  04/16/17  Yes [provider]  nitroGLYCERIN (NITROSTAT) 0.4 MG SL tablet Place 1 tablet (0.4 mg total) under the tongue every 5 (five) minutes as needed for chest pain. 07/09/16  Yes Holley Raring, MD  polyethylene glycol (MIRALAX / GLYCOLAX) packet Take 17 g by mouth 2 (two) times daily. Patient taking differently: Take 17 g by mouth daily as needed for mild constipation.  02/09/14  Yes Thurnell Lose, MD  potassium chloride SA (K-DUR,KLOR-CON) 20 MEQ tablet Take 20 mEq by mouth daily.   Yes [provider]  pregabalin (LYRICA) 50 MG capsule Take 50 mg by mouth at bedtime.    Yes [provider]  pregabalin (LYRICA) 50 MG capsule Take 50 mg by mouth 3 (three) times daily.    Yes [provider]  torsemide (DEMADEX) 20 MG tablet Take 40 mg by mouth 3 (three) times daily.  04/17/17  Yes [provider]  VITAMIN E PO Take 1 capsule by mouth daily.   Yes [provider]  furosemide (LASIX) 40 MG tablet Take 1 tablet (40 mg total) by mouth 2 (two) times daily. Patient not taking: Reported on 04/10/2018 09/16/16 10/16/16  Reyne Dumas, MD   Allergies  Allergen Reactions  . Iodine Anaphylaxis  . Penicillins Anaphylaxis    Rec'd cefazolin 02/04/14 pre-op for femur ORIF Has patient had a PCN reaction causing immediate rash, facial/tongue/throat swelling, SOB or lightheadedness with hypotension: Unknown Has patient had a PCN reaction causing severe rash involving mucus membranes or skin necrosis: Unknown Has patient had a PCN reaction that required hospitalization: Unknown Has patient had a PCN reaction occurring within the last 10 years:  Unknown If all of the above answers are "NO", then may proceed with Cephalosporin u  . Shellfish Allergy Anaphylaxis  . Shellfish-Derived Products Anaphylaxis  . Sulfa Antibiotics Anaphylaxis  . Sulfacetamide Sodium Anaphylaxis  . Sulfasalazine Anaphylaxis  . Atorvastatin     Other reaction(s): Unknown  . Morphine And Related Other (See Comments)    Altered mental status    FAMILY HISTORY:  family history includes CVA in her father; Diabetes in her father and other; Hypertension in her father; Liver disease in her mother. SOCIAL HISTORY:  reports that she has never smoked. She has never used smokeless tobacco. She reports that she does not drink alcohol or use drugs.  REVIEW OF SYSTEMS:   Unable to obtain due to critical illness   VITAL SIGNS: Temp:  [98 F (36.7 C)-103 F (39.4 C)] 98.7 F (37.1 C) (09/08 1235) Pulse Rate:  [79-97] 79 (09/08 1400) Resp:  [19-24] 20 (09/08 1400) BP: (133-157)/(48-94) 133/94 (09/08 1400) SpO2:  [92 %-100 %] 94 % (09/08 1400) FiO2 (%):  [35 %] 35 % (09/08 1235) Weight:  [106.1 kg-107.5 kg] 106.1 kg (09/08 1235)  Physical Examination:  Somnolent, arousable to touch tracking, moving extremities however not following commands.  No neck rigidity and  normal muscle tone. Tolerating BiPAP, no distress, bilateral equal air entry and no adventitious sounds S1 sign and S2 are audible with no murmur Benign abdomen with feeble peristalsis Wasted extremities with chronic skin changes more on the right side and dressed right big toe ulcer.   ASSESSMENT / PLAN:  Acute hypercarbic respiratory failure. -Optimize BiPAP settings, monitor ABG and work of breathing. -Consider intubation if no improvement  Altered mental status with toxic metabolic encephalopathy (hyper cardia, hyperammonemia).  CT head on admission showed no acute intracranial abnormalities, stable 1 cm calcified hemangioma right posterior fossa. -Optimize ABG and metabolic  derangement -Monitor neuro status and consider repeat CT head if no improvement  Atelectasis and pneumonia.  Bibasilar airspace disease. -On ceftriaxone + vancomycin.  Monitor CXR + CBC + FiO2  Right foot ulcer.  No osteomyelitis changes on x-ray, history of peripheral artery disease status post right popliteal peroneal revascularization graft. -Continue with wound care and management as per podiatry. -ASA + Plavix  UTI.  E. coli her urine culture -Continue ceftriaxone + vancomycin and follow final culture results  Uncontrolled diabetes mellitus with hemoglobin A1c 8.8 -Optimize glycemic control  Mild CHF with pulmonary congestion on 04/10/2018 chest x-ray -Watch for volume overload and monitor chest x-ray -Follow with echo report  Hypertension -Optimize antihypertensives and monitor hemodynamics  Dyslipidemia -Statin  Full code  DVT & GI prophylaxis.  Continue with supportive care.  Critical care time 50 minutes  Mental status improved patient is awake and able to hold a conversation with her family at the bedside.  Repeat ABG showed pH 7.25 from 7.18 however more likely mixed venous sample with O2 sat 78% on the gas while sound percent on the monitor. We will try off BiPAP on the nasal cannula, monitor respiratory status, mental status and ABG.   Mr. Ciampa (husband) was updated with the patient condition and agreed to the plan of care.

## 2018-04-12 NOTE — Progress Notes (Signed)
Tech informed this RN of temperature of 103. Abel Presto, MD notified. Patient given 650mg  acetaminophen. Environment adjusted (extra blankets removed from patient and temperature in room adjusted from 55F to 58F. Will recheck temp to see if interventions were effective. Urine culture and blood cultures pending. No new orders at this time

## 2018-04-12 NOTE — Progress Notes (Signed)
Pt on [place] 2L Per Dr. Soyla Murphy. Pt is confused, but talkative, following commands and recognizes family members.

## 2018-04-12 NOTE — Progress Notes (Signed)
Patient awoke and was trying to pull bipap off, placed patient on [place] at 2l per Dr.Samaan request.

## 2018-04-12 NOTE — Progress Notes (Signed)
PT Cancellation Note  Patient Details Name: [name] MRN: [id number] DOB: [birth year]   Cancelled Treatment:    Reason Eval/Treat Not Completed: Medical issues which prohibited therapy(Per chart review, patiet noted with transfer to CCU due to decline in respiratory status and need for BiPAP.  Per policy, will require new orders to resume PT services.  Please re-consult as medically appropriate.)  Katilin Raynes H. Owens Shark, PT, DPT, NCS 04/12/18, 11:08 PM [phone]

## 2018-04-13 ENCOUNTER — Inpatient Hospital Stay: Payer: Medicare HMO

## 2018-04-13 LAB — CBC WITH DIFFERENTIAL/PLATELET
BASOS PCT: 0 %
Basophils Absolute: 0 10*3/uL (ref 0–0.1)
EOS ABS: 0 10*3/uL (ref 0–0.7)
Eosinophils Relative: 0 %
HEMATOCRIT: 40 % (ref 35.0–47.0)
Hemoglobin: 12.9 g/dL (ref 12.0–16.0)
Lymphocytes Relative: 8 %
Lymphs Abs: 0.6 10*3/uL — ABNORMAL LOW (ref 1.0–3.6)
MCH: 27.4 pg (ref 26.0–34.0)
MCHC: 32.1 g/dL (ref 32.0–36.0)
MCV: 85.2 fL (ref 80.0–100.0)
MONO ABS: 0.8 10*3/uL (ref 0.2–0.9)
MONOS PCT: 10 %
NEUTROS ABS: 7 10*3/uL — AB (ref 1.4–6.5)
Neutrophils Relative %: 82 %
Platelets: 126 10*3/uL — ABNORMAL LOW (ref 150–440)
RBC: 4.7 MIL/uL (ref 3.80–5.20)
RDW: 17.2 % — ABNORMAL HIGH (ref 11.5–14.5)
WBC: 8.4 10*3/uL (ref 3.6–11.0)

## 2018-04-13 LAB — BASIC METABOLIC PANEL
Anion gap: 6 (ref 5–15)
BUN: 12 mg/dL (ref 8–23)
CO2: 35 mmol/L — AB (ref 22–32)
Calcium: 9.2 mg/dL (ref 8.9–10.3)
Chloride: 100 mmol/L (ref 98–111)
Creatinine, Ser: 0.71 mg/dL (ref 0.44–1.00)
GFR calc non Af Amer: >60 mL/min (ref >60)
Glucose, Bld: 241 mg/dL — ABNORMAL HIGH (ref 70–99)
Potassium: 5 mmol/L (ref 3.5–5.1)
Sodium: 141 mmol/L (ref 135–145)

## 2018-04-13 LAB — GLUCOSE, CAPILLARY
GLUCOSE-CAPILLARY: 213 mg/dL — AB (ref 70–99)
GLUCOSE-CAPILLARY: 227 mg/dL — AB (ref 70–99)
GLUCOSE-CAPILLARY: 247 mg/dL — AB (ref 70–99)
Glucose-Capillary: 262 mg/dL — ABNORMAL HIGH (ref 70–99)
Glucose-Capillary: 194 mg/dL — ABNORMAL HIGH (ref 70–99)

## 2018-04-13 LAB — BLOOD GAS, ARTERIAL
Acid-Base Excess: 9.6 mmol/L — ABNORMAL HIGH (ref 0.0–2.0)
Bicarbonate: 38.9 mmol/L — ABNORMAL HIGH (ref 20.0–28.0)
DELIVERY SYSTEMS: POSITIVE
EXPIRATORY PAP: 5
FIO2: 0.35
INSPIRATORY PAP: 18
Mechanical Rate: 12
O2 SAT: 96.4 %
PO2 ART: 93 mmHg (ref 83.0–108.0)
Patient temperature: 37
pCO2 arterial: 79 mmHg (ref 32.0–48.0)
pH, Arterial: 7.3 — ABNORMAL LOW (ref 7.350–7.450)

## 2018-04-13 LAB — LIPID PANEL
Cholesterol: 158 mg/dL (ref 0–200)
HDL: 35 mg/dL — ABNORMAL LOW (ref >40)
LDL Cholesterol: 101 mg/dL — ABNORMAL HIGH (ref 0–99)
TRIGLYCERIDES: 109 mg/dL (ref <150)
Total CHOL/HDL Ratio: 4.5 RATIO
VLDL: 22 mg/dL (ref 0–40)

## 2018-04-13 LAB — URINE CULTURE: Culture: 50000 — AB

## 2018-04-13 LAB — ECHOCARDIOGRAM COMPLETE
Height: 65 in
WEIGHTICAEL: 3568 [oz_av]

## 2018-04-13 LAB — HEPATIC FUNCTION PANEL
ALT: 22 U/L (ref 0–44)
AST: 16 U/L (ref 15–41)
Albumin: 2.9 g/dL — ABNORMAL LOW (ref 3.5–5.0)
Alkaline Phosphatase: 59 U/L (ref 38–126)
BILIRUBIN TOTAL: 0.8 mg/dL (ref 0.3–1.2)
Total Protein: 6.3 g/dL — ABNORMAL LOW (ref 6.5–8.1)

## 2018-04-13 LAB — PHOSPHORUS: PHOSPHORUS: 2.7 mg/dL (ref 2.5–4.6)

## 2018-04-13 LAB — MAGNESIUM: Magnesium: 2.1 mg/dL (ref 1.7–2.4)

## 2018-04-13 LAB — AMMONIA: Ammonia: 32 umol/L (ref 9–35)

## 2018-04-13 LAB — BRAIN NATRIURETIC PEPTIDE: B NATRIURETIC PEPTIDE 5: 322 pg/mL — AB (ref 0.0–100.0)

## 2018-04-13 MED ORDER — LORAZEPAM 2 MG/ML IJ SOLN
1.0000 mg | Freq: Once | INTRAMUSCULAR | Status: AC
  Administered 2018-04-13: 1 mg via INTRAVENOUS
  Filled 2018-04-13: qty 1

## 2018-04-13 MED ORDER — LABETALOL HCL 5 MG/ML IV SOLN
10.0000 mg | INTRAVENOUS | Status: DC | PRN
  Administered 2018-04-13 – 2018-04-15 (×3): 10 mg via INTRAVENOUS
  Filled 2018-04-13 (×3): qty 4

## 2018-04-13 MED ORDER — LEVALBUTEROL HCL 0.63 MG/3ML IN NEBU
0.6300 mg | INHALATION_SOLUTION | Freq: Once | RESPIRATORY_TRACT | Status: AC
  Administered 2018-04-13: 0.63 mg via RESPIRATORY_TRACT
  Filled 2018-04-13: qty 3

## 2018-04-13 MED ORDER — FUROSEMIDE 10 MG/ML IJ SOLN
20.0000 mg | Freq: Once | INTRAMUSCULAR | Status: AC
  Administered 2018-04-13: 20 mg via INTRAVENOUS
  Filled 2018-04-13: qty 2

## 2018-04-13 MED ORDER — HYDRALAZINE HCL 20 MG/ML IJ SOLN
10.0000 mg | Freq: Four times a day (QID) | INTRAMUSCULAR | Status: DC | PRN
  Administered 2018-04-13 – 2018-04-15 (×3): 10 mg via INTRAVENOUS
  Filled 2018-04-13 (×4): qty 1

## 2018-04-13 NOTE — Progress Notes (Signed)
Pharmacy Antibiotic Note  [name] is a 77 y.o. female admitted on 04/10/2018 with foot wound/nonhealing ulcer. Patient remaines on BiPAP. No osteomyelitis identified on X-ray. Patient with pan-sensitve Ecoli in urine. Pharmacy has been consulted for Vancomycin dosing.  Plan: Per ICU rounds, will discontinue vancomycin and continue ceftriaxone for total course of 7 days to cover possible lung involvement.   Height: 5\' 5"  (165.1 cm) Weight: 233 lb 14.5 oz (106.1 kg) IBW/kg (Calculated) : 57  Temp (24hrs), Avg:98.6 F (37 C), Min:97.9 F (36.6 C), Max:99.1 F (37.3 C)  Recent Labs  Lab 04/10/18 1135 04/11/18 0355 04/12/18 1025 04/13/18 0553  WBC 15.3* 9.0 8.8 8.4  CREATININE 0.90 0.70 0.81 0.71    Estimated Creatinine Clearance: 73.5 mL/min (by C-G formula based on SCr of 0.71 mg/dL).    Allergies  Allergen Reactions  . Iodine Anaphylaxis  . Penicillins Anaphylaxis    Rec'd cefazolin 02/04/14 pre-op for femur ORIF Has patient had a PCN reaction causing immediate rash, facial/tongue/throat swelling, SOB or lightheadedness with hypotension: Unknown Has patient had a PCN reaction causing severe rash involving mucus membranes or skin necrosis: Unknown Has patient had a PCN reaction that required hospitalization: Unknown Has patient had a PCN reaction occurring within the last 10 years: Unknown If all of the above answers are "NO", then may proceed with Cephalosporin u  . Shellfish Allergy Anaphylaxis  . Shellfish-Derived Products Anaphylaxis  . Sulfa Antibiotics Anaphylaxis  . Sulfacetamide Sodium Anaphylaxis  . Sulfasalazine Anaphylaxis  . Atorvastatin     Other reaction(s): Unknown  . Morphine And Related Other (See Comments)    Altered mental status    Antimicrobials this admission: Vancomycin 9/6 >> 9/9 Ceftriaxone 9/6 >>    Dose adjustments this admission:    Microbiology results: 9/6 BCx: no grwoth x 3 days  9/6 UCx: 50K E.coli, pan-sensitive   Thank you  for allowing pharmacy to be a part of this patient's care.  Starla Deller L 04/13/2018 10:58 AM

## 2018-04-13 NOTE — Progress Notes (Signed)
Patient high risk for aspiration and on BiPAP.  When BiPAP removed for mouth care mouth rinse rolls out of side of patients mouth.  Information discussed in rounds with plan to hold PO medications until Dr. Jefferson Fuel speaks with family about treatment plan.  Due to ABG results patient unable to come off BiPAP.

## 2018-04-13 NOTE — Progress Notes (Signed)
Inpatient Diabetes Program Recommendations  AACE/ADA: New Consensus Statement on Inpatient Glycemic Control (2019)  Target Ranges:  Prepandial:   less than 140 mg/dL      Peak postprandial:   less than 180 mg/dL (1-2 hours)      Critically ill patients:  140 - 180 mg/dL  Results for NOHELI, MELDER (MRN [id number]) as of 04/13/2018 08:11  Ref. Range 04/12/2018 07:56 04/12/2018 11:56 04/12/2018 12:36 04/12/2018 16:02 04/12/2018 22:30 04/13/2018 07:24  Glucose-Capillary Latest Ref Range: 70 - 99 mg/dL 213 (H) 240 (H) 243 (H) 212 (H) 243 (H) 262 (H)    Review of Glycemic Control  Diabetes history: DM2 Outpatient Diabetes medications: Lantus 20 units QAM, Lantus 28 units QHS, Novolog 0-15 units TID with meals Current orders for Inpatient glycemic control: Lantus 20 units QHS, Novolog 0-9 units TID with meals, Novolog 0-5 units QHS  Inpatient Diabetes Program Recommendations:  Insulin - Basal: Please consider ordering Lantus 10 units QAM (and continue Lantus 20 units QHS).  Thanks, Barnie Alderman, RN, MSN, CDE Diabetes Coordinator Inpatient Diabetes Program [phone] (Team Pager from 8am to 5pm)

## 2018-04-13 NOTE — Progress Notes (Signed)
Daughter concerned about patient having stroke since patient is presenting with same type of symptoms from previous stroke.  Dr. Jefferson Fuel notified.  Order obtained for CT of the head.

## 2018-04-13 NOTE — Progress Notes (Signed)
[place] at [facility]                                                                                                                                                                                  Patient Demographics   [name], is a 77 y.o. female, DOB - 1941-04-18, JHE:[id number]  Admit date - 04/10/2018   Admitting Physician Loletha Grayer, MD  Outpatient Primary MD for the patient is Adamo, Hattie Perch, MD   LOS - 3  Subjective: Patient continues to be drowsy    Review of Systems:   CONSTITUTIONAL: Drowsy able to provide   Vitals:   Vitals:   04/13/18 1100 04/13/18 1200 04/13/18 1300 04/13/18 1400  BP: (!) 156/50 (!) 153/76 (!) 152/68 (!) 161/58  Pulse: 88 86 84 88  Resp: 19 17 20 14   Temp:      TempSrc:      SpO2: 100% 100% 100% 100%  Weight:      Height:        Wt Readings from Last 3 Encounters:  04/12/18 106.1 kg  12/12/17 101.2 kg  06/13/17 98.4 kg     Intake/Output Summary (Last 24 hours) at 04/13/2018 1511 Last data filed at 04/13/2018 1400 Gross per 24 hour  Intake 535.21 ml  Output 105 ml  Net 430.21 ml    Physical Exam:   GENERAL: Ill-appearing HEAD, EYES, EARS, NOSE AND THROAT: Atraumatic, normocephalic. Extraocular muscles are intact. Pupils equal and reactive to light. Sclerae anicteric. No conjunctival injection. No oro-pharyngeal erythema.  NECK: Supple. There is no jugular venous distention. No bruits, no lymphadenopathy, no thyromegaly.  HEART: Regular rate and rhythm,. No murmurs, no rubs, no clicks.  LUNGS: Clear to auscultation bilaterally. No rales or rhonchi. No wheezes.  ABDOMEN: Soft, flat, nontender, nondistended. Has good bowel sounds. No hepatosplenomegaly appreciated.  EXTREMITIES: Right lower extremity chronic ulcer.  NEUROLOGIC: The patient is drowsy  sKIN: Moist and warm with no rashes appreciated.  Psych: Not anxious, depressed LN: No inguinal LN enlargement    Antibiotics    Anti-infectives (From admission, onward)   Start     Dose/Rate Route Frequency Ordered Stop   04/11/18 1000  cefTRIAXone (ROCEPHIN) 1 g in sodium chloride 0.9 % 100 mL IVPB     1 g 200 mL/hr over 30 Minutes Intravenous Every 24 hours 04/10/18 1427 04/18/18 0959   04/11/18 0400  vancomycin (VANCOCIN) IVPB 1000 mg/200 mL premix  Status:  Discontinued     1,000 mg 200 mL/hr over 60 Minutes Intravenous Every 18 hours 04/10/18 1907 04/13/18 1034   04/10/18 1730  vancomycin (VANCOCIN) 1,250 mg in sodium chloride 0.9 % 250  mL IVPB     1,250 mg 166.7 mL/hr over 90 Minutes Intravenous  Once 04/10/18 1700 04/10/18 2009   04/10/18 1515  vancomycin (VANCOCIN) IVPB 1000 mg/200 mL premix  Status:  Discontinued     1,000 mg 200 mL/hr over 60 Minutes Intravenous  Once 04/10/18 1504 04/10/18 1700   04/10/18 1345  cefTRIAXone (ROCEPHIN) 1 g in sodium chloride 0.9 % 100 mL IVPB     1 g 200 mL/hr over 30 Minutes Intravenous  Once 04/10/18 1343 04/10/18 1518      Medications   Scheduled Meds: . amLODipine  10 mg Oral Daily  . aspirin EC  325 mg Oral Q breakfast  . atorvastatin  20 mg Oral q1800  . carvedilol  6.25 mg Oral BID WC  . cholecalciferol  2,000 Units Oral Daily  . clopidogrel  75 mg Oral Q breakfast  . docusate sodium  100 mg Oral BID  . enoxaparin (LOVENOX) injection  40 mg Subcutaneous Q24H  . insulin aspart  0-5 Units Subcutaneous QHS  . insulin aspart  0-9 Units Subcutaneous TID WC  . insulin glargine  20 Units Subcutaneous QHS  . isosorbide dinitrate  30 mg Oral Daily  . loratadine  10 mg Oral Daily  . losartan  100 mg Oral Daily  . metoprolol succinate  50 mg Oral Daily  . pantoprazole  40 mg Oral Daily  . potassium chloride SA  20 mEq Oral Daily   Continuous Infusions: . sodium chloride 10 mL/hr at 04/13/18 1400  . cefTRIAXone (ROCEPHIN)  IV Stopped (04/13/18 1019)   PRN Meds:.sodium chloride, acetaminophen **OR** acetaminophen, albuterol, fluticasone, LORazepam,  nitroGLYCERIN, ondansetron **OR** ondansetron (ZOFRAN) IV, polyethylene glycol   Data Review:   Micro Results Recent Results (from the past 240 hour(s))  Urine Culture     Status: Abnormal   Collection Time: 04/10/18 12:41 PM  Result Value Ref Range Status   Specimen Description   Final    URINE, RANDOM Performed at [facility], [address], [place], [place] [address]    Special Requests   Final    NONE Performed at [facility], [place], [place] [address]    Culture 50,000 COLONIES/mL ESCHERICHIA COLI (A)  Final   Report Status 04/13/2018 FINAL  Final   Organism ID, Bacteria ESCHERICHIA COLI (A)  Final      Susceptibility   Escherichia coli - MIC*    AMPICILLIN <=2 SENSITIVE Sensitive     CEFAZOLIN <=4 SENSITIVE Sensitive     CEFTRIAXONE <=1 SENSITIVE Sensitive     CIPROFLOXACIN <=0.25 SENSITIVE Sensitive     GENTAMICIN <=1 SENSITIVE Sensitive     IMIPENEM 1 SENSITIVE Sensitive     NITROFURANTOIN 32 SENSITIVE Sensitive     TRIMETH/SULFA <=20 SENSITIVE Sensitive     AMPICILLIN/SULBACTAM <=2 SENSITIVE Sensitive     PIP/TAZO <=4 SENSITIVE Sensitive     Extended ESBL NEGATIVE Sensitive     * 50,000 COLONIES/mL ESCHERICHIA COLI  CULTURE, BLOOD (ROUTINE X 2) w Reflex to ID Panel     Status: None (Preliminary result)   Collection Time: 04/10/18  5:18 PM  Result Value Ref Range Status   Specimen Description BLOOD L HAND  Final   Special Requests   Final    BOTTLES DRAWN AEROBIC AND ANAEROBIC Blood Culture results may not be optimal due to an inadequate volume of blood received in culture bottles   Culture   Final    NO GROWTH 3 DAYS  Performed at [facility], [address] [place], [place] [address]    Report Status PENDING  Incomplete  CULTURE, BLOOD (ROUTINE X 2) w Reflex to ID Panel     Status: None (Preliminary result)   Collection Time: 04/10/18  6:11 PM  Result Value Ref Range Status   Specimen Description BLOOD BLOOD  LEFT HAND  Final   Special Requests   Final    BOTTLES DRAWN AEROBIC ONLY Blood Culture results may not be optimal due to an inadequate volume of blood received in culture bottles   Culture   Final    NO GROWTH 3 DAYS Performed at [facility], [address] [place], [place] [address]    Report Status PENDING  Incomplete    Radiology Reports Ct Head Wo Contrast  Result Date: 04/10/2018 CLINICAL DATA:  77 year old female with altered mental status. EXAM: CT HEAD WITHOUT CONTRAST TECHNIQUE: Contiguous axial images were obtained from the base of the skull through the vertex without intravenous contrast. COMPARISON:  08/29/2016 CT and prior studies FINDINGS: Brain: No evidence of acute infarction, hemorrhage, hydrocephalus, extra-axial collection or mass effect. A 1 cm calcified meningioma within the RIGHT posterior fossa is unchanged and without adjacent parenchymal edema. Mild atrophy and mild chronic small-vessel white matter ischemic changes again noted. Vascular: Carotid atherosclerotic calcifications noted. Skull: Normal. Negative for fracture or focal lesion. Sinuses/Orbits: No acute finding. Other: None. IMPRESSION: 1. No evidence of acute intracranial abnormality. 2. Mild atrophy and chronic small-vessel white matter ischemic changes. 3. Stable 1 cm calcified meningioma within the RIGHT posterior fossa. Electronically Signed   By: Margarette Canada M.D.   On: 04/10/2018 12:08   Dg Chest Port 1 View  Result Date: 04/12/2018 CLINICAL DATA:  Patient med with CHF.  Congestive heart failure. EXAM: PORTABLE CHEST 1 VIEW COMPARISON:  April 10, 2018 FINDINGS: Cardiomegaly. Mild edema. Opacity is no more focal in the right base and there may be an associated small layering effusion. No other change. IMPRESSION: 1. Cardiomegaly and mild edema. 2. The more focal opacity in the right base is favored represent a small effusion and underlying atelectasis. A developing superimposed infiltrate is not  excluded. Electronically Signed   By: Dorise Bullion III M.D   On: 04/12/2018 16:02   Dg Chest Port 1 View  Result Date: 04/10/2018 CLINICAL DATA:  Altered mental status. EXAM: PORTABLE CHEST 1 VIEW COMPARISON:  10/30/2017. FINDINGS: Cardiomegaly with mild pulmonary vascular prominence and bilateral interstitial prominence. Findings consistent with mild CHF. Small bilateral pleural effusions. No pneumothorax. IMPRESSION: Cardiomegaly with mild pulmonary interstitial prominence consistent with mild CHF. Electronically Signed   By: Marcello Moores  Register   On: 04/10/2018 12:11   Dg Foot Complete Right  Result Date: 04/10/2018 CLINICAL DATA:  Chronic wound of the right foot. EXAM: RIGHT FOOT COMPLETE - 3+ VIEW COMPARISON:  None. FINDINGS: No acute fracture or dislocation. Arthrodesis of the first MTP joint. Generalized osteopenia. No periosteal reaction or bone destruction. No soft tissue emphysema or radiopaque foreign body. Generalized soft tissue swelling of the right lower leg and foot. Peripheral vascular atherosclerotic disease. IMPRESSION: 1. No radiographic evidence of osteomyelitis of the right foot. Electronically Signed   By: Kathreen Devoid   On: 04/10/2018 15:28     CBC Recent Labs  Lab 04/10/18 1135 04/11/18 0355 04/12/18 1025 04/13/18 0553  WBC 15.3* 9.0 8.8 8.4  HGB 13.9 13.7 12.1 12.9  HCT 42.5 41.8 37.9 40.0  PLT 168 151 121* 126*  MCV  83.9 85.1 86.5 85.2  MCH 27.5 27.8 27.5 27.4  MCHC 32.8 32.7 31.9* 32.1  RDW 17.0* 17.5* 17.8* 17.2*  LYMPHSABS 0.3*  --  0.6* 0.6*  MONOABS 0.7  --  0.8 0.8  EOSABS 0.0  --  0.0 0.0  BASOSABS 0.0  --  0.0 0.0    Chemistries  Recent Labs  Lab 04/10/18 1135 04/11/18 0355 04/12/18 1025 04/13/18 0553  NA 140 140 139 141  K 3.7 4.4 5.0 5.0  CL 100 103 100 100  CO2 31 31 32 35*  GLUCOSE 167* 142* 257* 241*  BUN 13 12 14 12   CREATININE 0.90 0.70 0.81 0.71  CALCIUM 9.0 8.8* 8.8* 9.2  MG  --   --   --  2.1  AST 22  --  21 16  ALT 22  --   25 22  ALKPHOS 76  --  54 59  BILITOT 1.1  --  0.6 0.8   ------------------------------------------------------------------------------------------------------------------ estimated creatinine clearance is 73.5 mL/min (by C-G formula based on SCr of 0.71 mg/dL). ------------------------------------------------------------------------------------------------------------------ Recent Labs    04/11/18 0355  HGBA1C 8.8*   ------------------------------------------------------------------------------------------------------------------ Recent Labs    04/13/18 0553  CHOL 158  HDL 35*  LDLCALC 101*  TRIG 109  CHOLHDL 4.5   ------------------------------------------------------------------------------------------------------------------ No results for input(s): TSH, T4TOTAL, T3FREE, THYROIDAB in the last 72 hours.  Invalid input(s): FREET3 ------------------------------------------------------------------------------------------------------------------ No results for input(s): VITAMINB12, FOLATE, FERRITIN, TIBC, IRON, RETICCTPCT in the last 72 hours.  Coagulation profile No results for input(s): INR, PROTIME in the last 168 hours.  No results for input(s): DDIMER in the last 72 hours.  Cardiac Enzymes No results for input(s): CKMB, TROPONINI, MYOGLOBIN in the last 168 hours.  Invalid input(s): CK ------------------------------------------------------------------------------------------------------------------ Invalid input(s): [place]    1.  Acute encephalopathy patient is acute hypercarbic respiratory failure continue BiPAP 2.  Chronic lower extremity ulcer appreciate podiatry input local wound care recommended outpatient follow-up with vascular 3.  Accelerated hypertension.    Pressure stable  4.  Acute congestive heart failure with lower extremity edema and anasarca.   5 .  Type 2 diabetes mellitus continue Lantus and sliding scale insulin  6..   Neuropathy Lyrica discontinued due to drowsiness  7.  Obesity.  Weight loss needed 8.  History of peripheral vascular disease and stroke on aspirin and Plavix.     Code Status Orders  (From admission, onward)         Start     Ordered   04/10/18 1437  Full code  Continuous     04/10/18 1437        Code Status History    Date Active Date Inactive Code Status Order ID Comments User Context   08/29/2016 0516 09/17/2016 1938 Full Code [id number]  Etta Quill, DO ED   07/05/2016 1755 07/09/2016 2131 Full Code [id number]  Shela Leff, MD Inpatient   03/01/2016 1524 03/13/2016 2119 Full Code [id number]  [name], MD Inpatient   03/01/2016 1450 03/01/2016 1524 Full Code [id number]  Marliss Coots, PA-C Inpatient   03/01/2016 1104 03/01/2016 1450 Full Code [id number]  Roland Earl, MD Inpatient   02/28/2016 1915 03/01/2016 1104 Full Code [id number]  Orbie Hurst Inpatient   02/12/2014 0708 02/13/2014 1850 Full Code [id number]  Oswald Hillock, MD Inpatient   02/08/2014 1317 02/10/2014 1758 Full Code [id number]  Wylene Simmer, MD Inpatient   02/03/2014 1459 02/08/2014 1317 Full Code [id number]  Black,  Lezlie Octave, NP Inpatient           Consults podiatry DVT Prophylaxis  Lovenox  Lab Results  Component Value Date   PLT 126 (L) 04/13/2018     Time Spent in minutes   37min  Greater than 50% of time spent in care coordination and counseling patient regarding the condition and plan of care.   Dustin Flock M.D on 04/13/2018 at 3:11 PM  Between 7am to 6pm - Pager - [phone]  After 6pm go to [email or web address] - Proofreader  [employer]   Office  [phone]

## 2018-04-13 NOTE — Progress Notes (Signed)
Follow up - Critical Care Medicine Note  Patient Details:    [name] is an 77 y.o. female. with history of diabetes mellitus, diabetic neuropathy, aortic stenosis, heart failure, dyslipidemia, hypertension, peripheral artery disease, status post right popliteal to peroneal bypass, CVA, GERD and chronic ulcer right big toe.  Patient was admitted to the hospital with altered mental status and diagnosis of acute encephalopathy.  She was noticed to be more lethargic and able to communicate. ABG was remarkable for acute hypercapnic respiratory failure, ICU transfer was requested because of requirement to titrate BiPAP  Anti-infectives:  Anti-infectives (From admission, onward)   Start     Dose/Rate Route Frequency Ordered Stop   04/11/18 1000  cefTRIAXone (ROCEPHIN) 1 g in sodium chloride 0.9 % 100 mL IVPB     1 g 200 mL/hr over 30 Minutes Intravenous Every 24 hours 04/10/18 1427 04/18/18 0959   04/11/18 0400  vancomycin (VANCOCIN) IVPB 1000 mg/200 mL premix  Status:  Discontinued     1,000 mg 200 mL/hr over 60 Minutes Intravenous Every 18 hours 04/10/18 1907 04/13/18 1034   04/10/18 1730  vancomycin (VANCOCIN) 1,250 mg in sodium chloride 0.9 % 250 mL IVPB     1,250 mg 166.7 mL/hr over 90 Minutes Intravenous  Once 04/10/18 1700 04/10/18 2009   04/10/18 1515  vancomycin (VANCOCIN) IVPB 1000 mg/200 mL premix  Status:  Discontinued     1,000 mg 200 mL/hr over 60 Minutes Intravenous  Once 04/10/18 1504 04/10/18 1700   04/10/18 1345  cefTRIAXone (ROCEPHIN) 1 g in sodium chloride 0.9 % 100 mL IVPB     1 g 200 mL/hr over 30 Minutes Intravenous  Once 04/10/18 1343 04/10/18 1518      Microbiology: Results for orders placed or performed during the hospital encounter of 04/10/18  Urine Culture     Status: Abnormal   Collection Time: 04/10/18 12:41 PM  Result Value Ref Range Status   Specimen Description   Final    URINE, RANDOM Performed at [facility], [address]  [place], [place] [address]    Special Requests   Final    NONE Performed at [facility], [address] [place], [place] [address]    Culture 50,000 COLONIES/mL ESCHERICHIA COLI (A)  Final   Report Status 04/13/2018 FINAL  Final   Organism ID, Bacteria ESCHERICHIA COLI (A)  Final      Susceptibility   Escherichia coli - MIC*    AMPICILLIN <=2 SENSITIVE Sensitive     CEFAZOLIN <=4 SENSITIVE Sensitive     CEFTRIAXONE <=1 SENSITIVE Sensitive     CIPROFLOXACIN <=0.25 SENSITIVE Sensitive     GENTAMICIN <=1 SENSITIVE Sensitive     IMIPENEM 1 SENSITIVE Sensitive     NITROFURANTOIN 32 SENSITIVE Sensitive     TRIMETH/SULFA <=20 SENSITIVE Sensitive     AMPICILLIN/SULBACTAM <=2 SENSITIVE Sensitive     PIP/TAZO <=4 SENSITIVE Sensitive     Extended ESBL NEGATIVE Sensitive     * 50,000 COLONIES/mL ESCHERICHIA COLI  CULTURE, BLOOD (ROUTINE X 2) w Reflex to ID Panel     Status: None (Preliminary result)   Collection Time: 04/10/18  5:18 PM  Result Value Ref Range Status   Specimen Description BLOOD L HAND  Final   Special Requests   Final    BOTTLES DRAWN AEROBIC AND ANAEROBIC Blood Culture results may not be optimal due to an inadequate volume of blood received in culture bottles   Culture   Final  NO GROWTH 3 DAYS Performed at [facility], [address] [place], [place] [address]    Report Status PENDING  Incomplete  CULTURE, BLOOD (ROUTINE X 2) w Reflex to ID Panel     Status: None (Preliminary result)   Collection Time: 04/10/18  6:11 PM  Result Value Ref Range Status   Specimen Description BLOOD BLOOD LEFT HAND  Final   Special Requests   Final    BOTTLES DRAWN AEROBIC ONLY Blood Culture results may not be optimal due to an inadequate volume of blood received in culture bottles   Culture   Final    NO GROWTH 3 DAYS Performed at [facility], [address], [place], [place] [address]    Report Status PENDING  Incomplete   Studies: Ct Head Wo  Contrast  Result Date: 04/10/2018 CLINICAL DATA:  77 year old female with altered mental status. EXAM: CT HEAD WITHOUT CONTRAST TECHNIQUE: Contiguous axial images were obtained from the base of the skull through the vertex without intravenous contrast. COMPARISON:  08/29/2016 CT and prior studies FINDINGS: Brain: No evidence of acute infarction, hemorrhage, hydrocephalus, extra-axial collection or mass effect. A 1 cm calcified meningioma within the RIGHT posterior fossa is unchanged and without adjacent parenchymal edema. Mild atrophy and mild chronic small-vessel white matter ischemic changes again noted. Vascular: Carotid atherosclerotic calcifications noted. Skull: Normal. Negative for fracture or focal lesion. Sinuses/Orbits: No acute finding. Other: None. IMPRESSION: 1. No evidence of acute intracranial abnormality. 2. Mild atrophy and chronic small-vessel white matter ischemic changes. 3. Stable 1 cm calcified meningioma within the RIGHT posterior fossa. Electronically Signed   By: Margarette Canada M.D.   On: 04/10/2018 12:08   Dg Chest Port 1 View  Result Date: 04/12/2018 CLINICAL DATA:  Patient med with CHF.  Congestive heart failure. EXAM: PORTABLE CHEST 1 VIEW COMPARISON:  April 10, 2018 FINDINGS: Cardiomegaly. Mild edema. Opacity is no more focal in the right base and there may be an associated small layering effusion. No other change. IMPRESSION: 1. Cardiomegaly and mild edema. 2. The more focal opacity in the right base is favored represent a small effusion and underlying atelectasis. A developing superimposed infiltrate is not excluded. Electronically Signed   By: Dorise Bullion III M.D   On: 04/12/2018 16:02   Dg Chest Port 1 View  Result Date: 04/10/2018 CLINICAL DATA:  Altered mental status. EXAM: PORTABLE CHEST 1 VIEW COMPARISON:  10/30/2017. FINDINGS: Cardiomegaly with mild pulmonary vascular prominence and bilateral interstitial prominence. Findings consistent with mild CHF. Small bilateral  pleural effusions. No pneumothorax. IMPRESSION: Cardiomegaly with mild pulmonary interstitial prominence consistent with mild CHF. Electronically Signed   By: Marcello Moores  Register   On: 04/10/2018 12:11   Dg Foot Complete Right  Result Date: 04/10/2018 CLINICAL DATA:  Chronic wound of the right foot. EXAM: RIGHT FOOT COMPLETE - 3+ VIEW COMPARISON:  None. FINDINGS: No acute fracture or dislocation. Arthrodesis of the first MTP joint. Generalized osteopenia. No periosteal reaction or bone destruction. No soft tissue emphysema or radiopaque foreign body. Generalized soft tissue swelling of the right lower leg and foot. Peripheral vascular atherosclerotic disease. IMPRESSION: 1. No radiographic evidence of osteomyelitis of the right foot. Electronically Signed   By: Kathreen Devoid   On: 04/10/2018 15:28    Consults: Treatment Team:  Pccm, Ander Gaster, MD   Subjective:    Overnight Issues: patient has remained on BiPAP,limited responsiveness on this morning's exam. Arterial blood gas was rechecked which showed hypercapnic respiratory failure with a pH  is 7.3 which was not significantly different from prior ABG.  Objective:  Vital signs for last 24 hours: Temp:  [97.9 F (36.6 C)-99.1 F (37.3 C)] 99.1 F (37.3 C) (09/09 0900) Pulse Rate:  [79-96] 88 (09/09 1000) Resp:  [13-24] 20 (09/09 1000) BP: (127-166)/(48-137) 165/69 (09/09 1000) SpO2:  [91 %-100 %] 100 % (09/09 1000) FiO2 (%):  [30 %-35 %] 35 % (09/09 1000) Weight:  [106.1 kg] 106.1 kg (09/08 1235)  Hemodynamic parameters for last 24 hours:    Intake/Output from previous day: 09/08 0701 - 09/09 0700 In: 554.5 [P.O.:100; I.V.:156.2; IV Piggyback:298.2] Out: 105 [Urine:103; Stool:2]  Intake/Output this shift: Total I/O In: 34 [I.V.:0.3; IV Piggyback:33.7] Out: -   Vent settings for last 24 hours: FiO2 (%):  [30 %-35 %] 35 %  Physical Exam:  Vital signs: Please see the above listed vital signs HEENT: Patient is presently wearing  BiPAP, admitted oral exam, trachea midline Cardiovascular: Irregularly irregular rhythm with controlled ventricular response Abdominal: Positive bowel sounds, soft exam Extremity: Dressed right big toe ulcer, chronic peripheral vascular disease changes Neruologic: Patient is encephalopathic, will minimally respond, moves all extremities  Assessment/Plan:   Acute hypercarbic respiratory failure requiring BiPAP  Altered mental status with toxic metabolic encephalopathy hypercapnia and  Hyperammonemia. Ammonia level was down to 32.  CT head on admission showed no acute intracranial abnormalities, stable 1 cm calcified hemangioma right posterior fossa.  Right foot ulcer.  No osteomyelitis changes on x-ray, history of peripheral artery disease status post right popliteal peroneal revascularization graft. Continue with wound care and management as per podiatry.  UTI.  E. coli her urine culture, on Rocephin  Uncontrolled diabetes mellitus sliding scale coverage  Mild CHF with pulmonary congestion on 04/10/2018 chest x-ray  Will need to discuss with family outlining goals of care and how aggressive they wish to be.  Critical Care Total Time 35 minutes  Drelyn Pistilli 04/13/2018  *Care during the described time interval was provided by me and/or other providers on the critical care team.  I have reviewed this patient's available data, including medical history, events of note, physical examination and test results as part of my evaluation.

## 2018-04-13 NOTE — Progress Notes (Signed)
Dr. Jefferson Fuel at bedside updating husband about patient status.

## 2018-04-13 NOTE — Progress Notes (Signed)
Patient not taking anything PO.  When mouth care preformed mouth wash rolls out of side of mouth.  Patient with decreased urine output.  Dr. Jefferson Fuel notified and verbalized since patient appears more awake to take BiPAP off and place patient on nasal cannula.  If patients able to stay off BiPAP have speech see patient in morning.  Dr. Jefferson Fuel also verbalized that patients BUN and creatine are normal so no IV fluids ordered.

## 2018-04-14 LAB — BLOOD GAS, ARTERIAL
ACID-BASE EXCESS: 13.4 mmol/L — AB (ref 0.0–2.0)
Bicarbonate: 40.1 mmol/L — ABNORMAL HIGH (ref 20.0–28.0)
Delivery systems: POSITIVE
Expiratory PAP: 5
FIO2: 0.3
Inspiratory PAP: 18
Mechanical Rate: 8
O2 Saturation: 94.4 %
PCO2 ART: 59 mmHg — AB (ref 32.0–48.0)
PO2 ART: 70 mmHg — AB (ref 83.0–108.0)
Patient temperature: 37
pH, Arterial: 7.44 (ref 7.350–7.450)

## 2018-04-14 LAB — GLUCOSE, CAPILLARY
GLUCOSE-CAPILLARY: 262 mg/dL — AB (ref 70–99)
GLUCOSE-CAPILLARY: 284 mg/dL — AB (ref 70–99)
GLUCOSE-CAPILLARY: 372 mg/dL — AB (ref 70–99)
Glucose-Capillary: 257 mg/dL — ABNORMAL HIGH (ref 70–99)

## 2018-04-14 LAB — CBC WITH DIFFERENTIAL/PLATELET
Basophils Absolute: 0 10*3/uL (ref 0–0.1)
Basophils Relative: 0 %
EOS ABS: 0 10*3/uL (ref 0–0.7)
Eosinophils Relative: 0 %
HEMATOCRIT: 40.7 % (ref 35.0–47.0)
HEMOGLOBIN: 13.2 g/dL (ref 12.0–16.0)
LYMPHS ABS: 0.7 10*3/uL — AB (ref 1.0–3.6)
LYMPHS PCT: 7 %
MCH: 27.4 pg (ref 26.0–34.0)
MCHC: 32.4 g/dL (ref 32.0–36.0)
MCV: 84.7 fL (ref 80.0–100.0)
Monocytes Absolute: 1.1 10*3/uL — ABNORMAL HIGH (ref 0.2–0.9)
Monocytes Relative: 11 %
NEUTROS ABS: 8.3 10*3/uL — AB (ref 1.4–6.5)
NEUTROS PCT: 82 %
Platelets: 134 10*3/uL — ABNORMAL LOW (ref 150–440)
RBC: 4.8 MIL/uL (ref 3.80–5.20)
RDW: 16.8 % — ABNORMAL HIGH (ref 11.5–14.5)
WBC: 10.1 10*3/uL (ref 3.6–11.0)

## 2018-04-14 LAB — BASIC METABOLIC PANEL
ANION GAP: 9 (ref 5–15)
BUN: 13 mg/dL (ref 8–23)
CHLORIDE: 100 mmol/L (ref 98–111)
CO2: 35 mmol/L — ABNORMAL HIGH (ref 22–32)
CREATININE: 0.57 mg/dL (ref 0.44–1.00)
Calcium: 9.4 mg/dL (ref 8.9–10.3)
GFR calc non Af Amer: >60 mL/min (ref >60)
Glucose, Bld: 293 mg/dL — ABNORMAL HIGH (ref 70–99)
POTASSIUM: 4.1 mmol/L (ref 3.5–5.1)
SODIUM: 144 mmol/L (ref 135–145)

## 2018-04-14 LAB — CALCIUM, IONIZED: CALCIUM, IONIZED, SERUM: 5 mg/dL (ref 4.5–5.6)

## 2018-04-14 LAB — THYROID PANEL WITH TSH
Free Thyroxine Index: 2.1 (ref 1.2–4.9)
T3 Uptake Ratio: 33 % (ref 24–39)
T4, Total: 6.3 ug/dL (ref 4.5–12.0)
TSH: 0.633 u[IU]/mL (ref 0.450–4.500)

## 2018-04-14 LAB — MRSA PCR SCREENING: MRSA by PCR: NEGATIVE

## 2018-04-14 MED ORDER — ETHACRYNATE SODIUM 50 MG IV SOLR
50.0000 mg | Freq: Once | INTRAVENOUS | Status: AC
  Administered 2018-04-15: 50 mg via INTRAVENOUS
  Filled 2018-04-14: qty 50

## 2018-04-14 MED ORDER — ETHACRYNATE SODIUM 50 MG IV SOLR
50.0000 mg | Freq: Once | INTRAVENOUS | Status: AC
  Administered 2018-04-14: 50 mg via INTRAVENOUS
  Filled 2018-04-14: qty 50

## 2018-04-14 NOTE — Progress Notes (Signed)
[place] at [facility]                                                                                                                                                                                  Patient Demographics   [name], is a 77 y.o. female, DOB - 12-13-1940, PZW:[id number]  Admit date - 04/10/2018   Admitting Physician Loletha Grayer, MD  Outpatient Primary MD for the patient is Adamo, Hattie Perch, MD   LOS - 4  Subjective: Patient continues to be drowsy on BiPAP this morning    Review of Systems:   CONSTITUTIONAL: Drowsy unable to provide   Vitals:   Vitals:   04/14/18 1200 04/14/18 1300 04/14/18 1400 04/14/18 1500  BP: (!) 164/67 (!) 164/85 (!) 159/54 (!) 144/75  Pulse: 91 96 91 85  Resp: 17 (!) 30 (!) 25 18  Temp: 98.6 F (37 C)     TempSrc: Oral     SpO2: 95% 93% 93% 93%  Weight:      Height:        Wt Readings from Last 3 Encounters:  04/12/18 106.1 kg  12/12/17 101.2 kg  06/13/17 98.4 kg     Intake/Output Summary (Last 24 hours) at 04/14/2018 1541 Last data filed at 04/14/2018 1324 Gross per 24 hour  Intake 849.23 ml  Output 1650 ml  Net -800.77 ml    Physical Exam:   GENERAL: Ill-appearing HEAD, EYES, EARS, NOSE AND THROAT: Atraumatic, normocephalic. Extraocular muscles are intact. Pupils equal and reactive to light. Sclerae anicteric. No conjunctival injection. No oro-pharyngeal erythema.  NECK: Supple. There is no jugular venous distention. No bruits, no lymphadenopathy, no thyromegaly.  HEART: Regular rate and rhythm,. No murmurs, no rubs, no clicks.  LUNGS: Clear to auscultation bilaterally. No rales or rhonchi. No wheezes.  ABDOMEN: Soft, flat, nontender, nondistended. Has good bowel sounds. No hepatosplenomegaly appreciated.  EXTREMITIES: Right lower extremity chronic ulcer.  NEUROLOGIC: The patient is drowsy  sKIN: Moist and warm with no rashes appreciated.  Psych: Not anxious, depressed LN: No inguinal  LN enlargement    Antibiotics   Anti-infectives (From admission, onward)   Start     Dose/Rate Route Frequency Ordered Stop   04/11/18 1000  cefTRIAXone (ROCEPHIN) 1 g in sodium chloride 0.9 % 100 mL IVPB     1 g 200 mL/hr over 30 Minutes Intravenous Every 24 hours 04/10/18 1427 04/18/18 0959   04/11/18 0400  vancomycin (VANCOCIN) IVPB 1000 mg/200 mL premix  Status:  Discontinued     1,000 mg 200 mL/hr over 60 Minutes Intravenous Every 18 hours 04/10/18 1907 04/13/18 1034   04/10/18 1730  vancomycin (  VANCOCIN) 1,250 mg in sodium chloride 0.9 % 250 mL IVPB     1,250 mg 166.7 mL/hr over 90 Minutes Intravenous  Once 04/10/18 1700 04/10/18 2009   04/10/18 1515  vancomycin (VANCOCIN) IVPB 1000 mg/200 mL premix  Status:  Discontinued     1,000 mg 200 mL/hr over 60 Minutes Intravenous  Once 04/10/18 1504 04/10/18 1700   04/10/18 1345  cefTRIAXone (ROCEPHIN) 1 g in sodium chloride 0.9 % 100 mL IVPB     1 g 200 mL/hr over 30 Minutes Intravenous  Once 04/10/18 1343 04/10/18 1518      Medications   Scheduled Meds: . amLODipine  10 mg Oral Daily  . aspirin EC  325 mg Oral Q breakfast  . atorvastatin  20 mg Oral q1800  . carvedilol  6.25 mg Oral BID WC  . cholecalciferol  2,000 Units Oral Daily  . clopidogrel  75 mg Oral Q breakfast  . docusate sodium  100 mg Oral BID  . enoxaparin (LOVENOX) injection  40 mg Subcutaneous Q24H  . insulin aspart  0-5 Units Subcutaneous QHS  . insulin aspart  0-9 Units Subcutaneous TID WC  . insulin glargine  20 Units Subcutaneous QHS  . isosorbide dinitrate  30 mg Oral Daily  . loratadine  10 mg Oral Daily  . losartan  100 mg Oral Daily  . pantoprazole  40 mg Oral Daily  . potassium chloride SA  20 mEq Oral Daily   Continuous Infusions: . sodium chloride 10 mL/hr at 04/14/18 0800  . cefTRIAXone (ROCEPHIN)  IV 1 g (04/14/18 0924)  . [START ON 04/15/2018] ethadrynate sodium (EDECRIN) IVPB    . ethadrynate sodium (EDECRIN) IVPB     PRN Meds:.sodium  chloride, acetaminophen **OR** acetaminophen, albuterol, fluticasone, hydrALAZINE, labetalol, LORazepam, nitroGLYCERIN, ondansetron **OR** ondansetron (ZOFRAN) IV, polyethylene glycol   Data Review:   Micro Results Recent Results (from the past 240 hour(s))  Urine Culture     Status: Abnormal   Collection Time: 04/10/18 12:41 PM  Result Value Ref Range Status   Specimen Description   Final    URINE, RANDOM Performed at [facility], [address] [place], [place] [address]    Special Requests   Final    NONE Performed at [facility], [place], [place] [address]    Culture 50,000 COLONIES/mL ESCHERICHIA COLI (A)  Final   Report Status 04/13/2018 FINAL  Final   Organism ID, Bacteria ESCHERICHIA COLI (A)  Final      Susceptibility   Escherichia coli - MIC*    AMPICILLIN <=2 SENSITIVE Sensitive     CEFAZOLIN <=4 SENSITIVE Sensitive     CEFTRIAXONE <=1 SENSITIVE Sensitive     CIPROFLOXACIN <=0.25 SENSITIVE Sensitive     GENTAMICIN <=1 SENSITIVE Sensitive     IMIPENEM 1 SENSITIVE Sensitive     NITROFURANTOIN 32 SENSITIVE Sensitive     TRIMETH/SULFA <=20 SENSITIVE Sensitive     AMPICILLIN/SULBACTAM <=2 SENSITIVE Sensitive     PIP/TAZO <=4 SENSITIVE Sensitive     Extended ESBL NEGATIVE Sensitive     * 50,000 COLONIES/mL ESCHERICHIA COLI  CULTURE, BLOOD (ROUTINE X 2) w Reflex to ID Panel     Status: None (Preliminary result)   Collection Time: 04/10/18  5:18 PM  Result Value Ref Range Status   Specimen Description BLOOD L HAND  Final   Special Requests   Final    BOTTLES DRAWN AEROBIC AND ANAEROBIC Blood Culture results may not be optimal due to  an inadequate volume of blood received in culture bottles   Culture   Final    NO GROWTH 4 DAYS Performed at [facility], [address] [place], [place] [address]    Report Status PENDING  Incomplete  CULTURE, BLOOD (ROUTINE X 2) w Reflex to ID Panel     Status: None (Preliminary result)    Collection Time: 04/10/18  6:11 PM  Result Value Ref Range Status   Specimen Description BLOOD BLOOD LEFT HAND  Final   Special Requests   Final    BOTTLES DRAWN AEROBIC ONLY Blood Culture results may not be optimal due to an inadequate volume of blood received in culture bottles   Culture   Final    NO GROWTH 4 DAYS Performed at [facility], [address], [place], [place] [address]    Report Status PENDING  Incomplete  MRSA PCR Screening     Status: None   Collection Time: 04/14/18  9:40 AM  Result Value Ref Range Status   MRSA by PCR NEGATIVE NEGATIVE Final    Comment:        The GeneXpert MRSA Assay (FDA approved for NASAL specimens only), is one component of a comprehensive MRSA colonization surveillance program. It is not intended to diagnose MRSA infection nor to guide or monitor treatment for MRSA infections. Performed at [facility], [address], [place], [place] [address]     Radiology Reports Ct Head Wo Contrast  Result Date: 04/13/2018 CLINICAL DATA:  Altered mental status.  Worsening confusion. EXAM: CT HEAD WITHOUT CONTRAST TECHNIQUE: Contiguous axial images were obtained from the base of the skull through the vertex without intravenous contrast. COMPARISON:  Head CT 04/10/2018 FINDINGS: Brain: Mild patient motion artifact. Unchanged degree of atrophy and chronic small vessel ischemia. Unchanged calcified right posterior fossa meningioma without significant mass effect. No intracranial hemorrhage. No midline shift. No hydrocephalus. The basilar cisterns are patent. No evidence of territorial infarct or acute ischemia. No extra-axial or intracranial fluid collection. Vascular: Atherosclerosis of skullbase vasculature without hyperdense vessel or abnormal calcification. Skull: No fracture or focal lesion. Sinuses/Orbits: No acute findings. Other: None. IMPRESSION: 1.  No acute intracranial abnormality. 2. Unchanged atrophy and chronic small  vessel ischemia. 3. Unchanged calcified right posterior fossa meningioma without mass effect. Electronically Signed   By: Keith Rake M.D.   On: 04/13/2018 22:41   Ct Head Wo Contrast  Result Date: 04/10/2018 CLINICAL DATA:  77 year old female with altered mental status. EXAM: CT HEAD WITHOUT CONTRAST TECHNIQUE: Contiguous axial images were obtained from the base of the skull through the vertex without intravenous contrast. COMPARISON:  08/29/2016 CT and prior studies FINDINGS: Brain: No evidence of acute infarction, hemorrhage, hydrocephalus, extra-axial collection or mass effect. A 1 cm calcified meningioma within the RIGHT posterior fossa is unchanged and without adjacent parenchymal edema. Mild atrophy and mild chronic small-vessel white matter ischemic changes again noted. Vascular: Carotid atherosclerotic calcifications noted. Skull: Normal. Negative for fracture or focal lesion. Sinuses/Orbits: No acute finding. Other: None. IMPRESSION: 1. No evidence of acute intracranial abnormality. 2. Mild atrophy and chronic small-vessel white matter ischemic changes. 3. Stable 1 cm calcified meningioma within the RIGHT posterior fossa. Electronically Signed   By: Margarette Canada M.D.   On: 04/10/2018 12:08   Dg Chest Port 1 View  Result Date: 04/13/2018 CLINICAL DATA:  Acute respiratory failure. EXAM: PORTABLE CHEST 1 VIEW COMPARISON:  Radiograph yesterday, as well as 04/10/2018 FINDINGS: Unchanged cardiomegaly and mediastinal contours. Progressive  hazy opacities at the lung bases likely combination of pleural fluid and atelectasis. Pulmonary edema is similar to prior exam. No pneumothorax. IMPRESSION: Progressive hazy opacity at the lung bases likely pleural effusions and atelectasis. Unchanged cardiomegaly and pulmonary edema. Electronically Signed   By: Keith Rake M.D.   On: 04/13/2018 22:09   Dg Chest Port 1 View  Result Date: 04/12/2018 CLINICAL DATA:  Patient med with CHF.  Congestive heart  failure. EXAM: PORTABLE CHEST 1 VIEW COMPARISON:  April 10, 2018 FINDINGS: Cardiomegaly. Mild edema. Opacity is no more focal in the right base and there may be an associated small layering effusion. No other change. IMPRESSION: 1. Cardiomegaly and mild edema. 2. The more focal opacity in the right base is favored represent a small effusion and underlying atelectasis. A developing superimposed infiltrate is not excluded. Electronically Signed   By: Dorise Bullion III M.D   On: 04/12/2018 16:02   Dg Chest Port 1 View  Result Date: 04/10/2018 CLINICAL DATA:  Altered mental status. EXAM: PORTABLE CHEST 1 VIEW COMPARISON:  10/30/2017. FINDINGS: Cardiomegaly with mild pulmonary vascular prominence and bilateral interstitial prominence. Findings consistent with mild CHF. Small bilateral pleural effusions. No pneumothorax. IMPRESSION: Cardiomegaly with mild pulmonary interstitial prominence consistent with mild CHF. Electronically Signed   By: Marcello Moores  Register   On: 04/10/2018 12:11   Dg Foot Complete Right  Result Date: 04/10/2018 CLINICAL DATA:  Chronic wound of the right foot. EXAM: RIGHT FOOT COMPLETE - 3+ VIEW COMPARISON:  None. FINDINGS: No acute fracture or dislocation. Arthrodesis of the first MTP joint. Generalized osteopenia. No periosteal reaction or bone destruction. No soft tissue emphysema or radiopaque foreign body. Generalized soft tissue swelling of the right lower leg and foot. Peripheral vascular atherosclerotic disease. IMPRESSION: 1. No radiographic evidence of osteomyelitis of the right foot. Electronically Signed   By: Kathreen Devoid   On: 04/10/2018 15:28     CBC Recent Labs  Lab 04/10/18 1135 04/11/18 0355 04/12/18 1025 04/13/18 0553 04/14/18 0424  WBC 15.3* 9.0 8.8 8.4 10.1  HGB 13.9 13.7 12.1 12.9 13.2  HCT 42.5 41.8 37.9 40.0 40.7  PLT 168 151 121* 126* 134*  MCV 83.9 85.1 86.5 85.2 84.7  MCH 27.5 27.8 27.5 27.4 27.4  MCHC 32.8 32.7 31.9* 32.1 32.4  RDW 17.0* 17.5*  17.8* 17.2* 16.8*  LYMPHSABS 0.3*  --  0.6* 0.6* 0.7*  MONOABS 0.7  --  0.8 0.8 1.1*  EOSABS 0.0  --  0.0 0.0 0.0  BASOSABS 0.0  --  0.0 0.0 0.0    Chemistries  Recent Labs  Lab 04/10/18 1135 04/11/18 0355 04/12/18 1025 04/13/18 0553 04/14/18 0424  NA 140 140 139 141 144  K 3.7 4.4 5.0 5.0 4.1  CL 100 103 100 100 100  CO2 31 31 32 35* 35*  GLUCOSE 167* 142* 257* 241* 293*  BUN 13 12 14 12 13   CREATININE 0.90 0.70 0.81 0.71 0.57  CALCIUM 9.0 8.8* 8.8* 9.2 9.4  MG  --   --   --  2.1  --   AST 22  --  21 16  --   ALT 22  --  25 22  --   ALKPHOS 76  --  54 59  --   BILITOT 1.1  --  0.6 0.8  --    ------------------------------------------------------------------------------------------------------------------ estimated creatinine clearance is 73.5 mL/min (by C-G formula based on SCr of 0.57 mg/dL). ------------------------------------------------------------------------------------------------------------------ No results for input(s): HGBA1C in the last 72 hours. ------------------------------------------------------------------------------------------------------------------  Recent Labs    04/13/18 0553  CHOL 158  HDL 35*  LDLCALC 101*  TRIG 109  CHOLHDL 4.5   ------------------------------------------------------------------------------------------------------------------ Recent Labs    04/13/18 1236  TSH 0.633  T4TOTAL 6.3   ------------------------------------------------------------------------------------------------------------------ No results for input(s): VITAMINB12, FOLATE, FERRITIN, TIBC, IRON, RETICCTPCT in the last 72 hours.  Coagulation profile No results for input(s): INR, PROTIME in the last 168 hours.  No results for input(s): DDIMER in the last 72 hours.  Cardiac Enzymes No results for input(s): CKMB, TROPONINI, MYOGLOBIN in the last 168 hours.  Invalid input(s):  CK ------------------------------------------------------------------------------------------------------------------ Invalid input(s): [place]    1.  Acute encephalopathy due to acute hypercarbic respiratory failure continue BiPAP, patient may need MRI due to previous history of stroke CT of the head was negative 2.  Chronic lower extremity ulcer appreciate podiatry input local wound care recommended outpatient follow-up with vascular 3.  Accelerated hypertension.    Pressure stable  4.  Acute congestive heart failure with lower extremity edema and anasarca.    Given IV Lasix now not on any diuretic 5 .  Type 2 diabetes mellitus continue Lantus and sliding scale insulin  6..   Cystitis present on admission currently on IV ceftriaxone  7.  Obesity.  Weight loss needed 8.  History of peripheral vascular disease and stroke on aspirin and Plavix.     Code Status Orders  (From admission, onward)         Start     Ordered   04/10/18 1437  Full code  Continuous     04/10/18 1437        Code Status History    Date Active Date Inactive Code Status Order ID Comments User Context   08/29/2016 0516 09/17/2016 1938 Full Code [id number]  Etta Quill, DO ED   07/05/2016 1755 07/09/2016 2131 Full Code [id number]  Shela Leff, MD Inpatient   03/01/2016 1524 03/13/2016 2119 Full Code [id number]  [name], MD Inpatient   03/01/2016 1450 03/01/2016 1524 Full Code [id number]  Marliss Coots, PA-C Inpatient   03/01/2016 1104 03/01/2016 1450 Full Code [id number]  Roland Earl, MD Inpatient   02/28/2016 1915 03/01/2016 1104 Full Code [id number]  Orbie Hurst Inpatient   02/12/2014 0708 02/13/2014 1850 Full Code [id number]  Oswald Hillock, MD Inpatient   02/08/2014 1317 02/10/2014 1758 Full Code [id number]  Wylene Simmer, MD Inpatient   02/03/2014 1459 02/08/2014 1317 Full Code [id number]  Radene Gunning, NP Inpatient           Consults podiatry DVT Prophylaxis   Lovenox  Lab Results  Component Value Date   PLT 134 (L) 04/14/2018     Time Spent in minutes   105min  Greater than 50% of time spent in care coordination and counseling patient regarding the condition and plan of care.   Dustin Flock M.D on 04/14/2018 at 3:41 PM  Between 7am to 6pm - Pager - [phone]  After 6pm go to [email or web address] - Proofreader  [employer]   Office  [phone]

## 2018-04-14 NOTE — Progress Notes (Signed)
Neuro: Pt remains confused, but able to follow commands at this time. Pt lethargic and A&OX1.   Respiratory: Pt off and on with Bipap. Pts o2 sats WNL.   Cardiovascular: Pt remains in sinus rhythm with some ectopy noted. Pt with elevated BP at times with no PRN medications given throughout shift. Pt afebrile.   GI/GU: Pt with external catheter in place with adequate urine output. Pt with 1000 ml of urine output throughout shift. Last BM 04/13/2018. Pt on mech soft diet and tolerating at this time.   Skin: Skin intact with no s/s of skin breakdown at this time. Pt continues to be a Copy turn. Wound to right foot with dressing intact.    Pain: Pt reporting and with no signs of pain at this time.   Events: NO acute events throughout shift. Pts plan of care to continue with current regimen, Family updated and no further questions at this time.

## 2018-04-14 NOTE — Progress Notes (Signed)
Follow up - Critical Care Medicine Note  Patient Details:    [name] is an 77 y.o. female. with history of diabetes mellitus, diabetic neuropathy, aortic stenosis, heart failure, dyslipidemia, hypertension, peripheral artery disease, status post right popliteal to peroneal bypass, CVA, GERD and chronic ulcer right big toe.  Patient was admitted to the hospital with altered mental status and diagnosis of acute encephalopathy.  She was noticed to be more lethargic and able to communicate. ABG was remarkable for acute hypercapnic respiratory failure, ICU transfer was requested because of requirement to titrate BiPAP  Anti-infectives:  Anti-infectives (From admission, onward)   Start     Dose/Rate Route Frequency Ordered Stop   04/11/18 1000  cefTRIAXone (ROCEPHIN) 1 g in sodium chloride 0.9 % 100 mL IVPB     1 g 200 mL/hr over 30 Minutes Intravenous Every 24 hours 04/10/18 1427 04/18/18 0959   04/11/18 0400  vancomycin (VANCOCIN) IVPB 1000 mg/200 mL premix  Status:  Discontinued     1,000 mg 200 mL/hr over 60 Minutes Intravenous Every 18 hours 04/10/18 1907 04/13/18 1034   04/10/18 1730  vancomycin (VANCOCIN) 1,250 mg in sodium chloride 0.9 % 250 mL IVPB     1,250 mg 166.7 mL/hr over 90 Minutes Intravenous  Once 04/10/18 1700 04/10/18 2009   04/10/18 1515  vancomycin (VANCOCIN) IVPB 1000 mg/200 mL premix  Status:  Discontinued     1,000 mg 200 mL/hr over 60 Minutes Intravenous  Once 04/10/18 1504 04/10/18 1700   04/10/18 1345  cefTRIAXone (ROCEPHIN) 1 g in sodium chloride 0.9 % 100 mL IVPB     1 g 200 mL/hr over 30 Minutes Intravenous  Once 04/10/18 1343 04/10/18 1518      Microbiology: Results for orders placed or performed during the hospital encounter of 04/10/18  Urine Culture     Status: Abnormal   Collection Time: 04/10/18 12:41 PM  Result Value Ref Range Status   Specimen Description   Final    URINE, RANDOM Performed at [facility], [address]  [place], [place] [address]    Special Requests   Final    NONE Performed at [facility], [address] [place], [place] [address]    Culture 50,000 COLONIES/mL ESCHERICHIA COLI (A)  Final   Report Status 04/13/2018 FINAL  Final   Organism ID, Bacteria ESCHERICHIA COLI (A)  Final      Susceptibility   Escherichia coli - MIC*    AMPICILLIN <=2 SENSITIVE Sensitive     CEFAZOLIN <=4 SENSITIVE Sensitive     CEFTRIAXONE <=1 SENSITIVE Sensitive     CIPROFLOXACIN <=0.25 SENSITIVE Sensitive     GENTAMICIN <=1 SENSITIVE Sensitive     IMIPENEM 1 SENSITIVE Sensitive     NITROFURANTOIN 32 SENSITIVE Sensitive     TRIMETH/SULFA <=20 SENSITIVE Sensitive     AMPICILLIN/SULBACTAM <=2 SENSITIVE Sensitive     PIP/TAZO <=4 SENSITIVE Sensitive     Extended ESBL NEGATIVE Sensitive     * 50,000 COLONIES/mL ESCHERICHIA COLI  CULTURE, BLOOD (ROUTINE X 2) w Reflex to ID Panel     Status: None (Preliminary result)   Collection Time: 04/10/18  5:18 PM  Result Value Ref Range Status   Specimen Description BLOOD L HAND  Final   Special Requests   Final    BOTTLES DRAWN AEROBIC AND ANAEROBIC Blood Culture results may not be optimal due to an inadequate volume of blood received in culture bottles   Culture   Final  NO GROWTH 4 DAYS Performed at [facility], [address] [place], [place] [address]    Report Status PENDING  Incomplete  CULTURE, BLOOD (ROUTINE X 2) w Reflex to ID Panel     Status: None (Preliminary result)   Collection Time: 04/10/18  6:11 PM  Result Value Ref Range Status   Specimen Description BLOOD BLOOD LEFT HAND  Final   Special Requests   Final    BOTTLES DRAWN AEROBIC ONLY Blood Culture results may not be optimal due to an inadequate volume of blood received in culture bottles   Culture   Final    NO GROWTH 4 DAYS Performed at [facility], [address], [place], [place] [address]    Report Status PENDING  Incomplete   Studies: Ct Head Wo  Contrast  Result Date: 04/13/2018 CLINICAL DATA:  Altered mental status.  Worsening confusion. EXAM: CT HEAD WITHOUT CONTRAST TECHNIQUE: Contiguous axial images were obtained from the base of the skull through the vertex without intravenous contrast. COMPARISON:  Head CT 04/10/2018 FINDINGS: Brain: Mild patient motion artifact. Unchanged degree of atrophy and chronic small vessel ischemia. Unchanged calcified right posterior fossa meningioma without significant mass effect. No intracranial hemorrhage. No midline shift. No hydrocephalus. The basilar cisterns are patent. No evidence of territorial infarct or acute ischemia. No extra-axial or intracranial fluid collection. Vascular: Atherosclerosis of skullbase vasculature without hyperdense vessel or abnormal calcification. Skull: No fracture or focal lesion. Sinuses/Orbits: No acute findings. Other: None. IMPRESSION: 1.  No acute intracranial abnormality. 2. Unchanged atrophy and chronic small vessel ischemia. 3. Unchanged calcified right posterior fossa meningioma without mass effect. Electronically Signed   By: Keith Rake M.D.   On: 04/13/2018 22:41   Ct Head Wo Contrast  Result Date: 04/10/2018 CLINICAL DATA:  77 year old female with altered mental status. EXAM: CT HEAD WITHOUT CONTRAST TECHNIQUE: Contiguous axial images were obtained from the base of the skull through the vertex without intravenous contrast. COMPARISON:  08/29/2016 CT and prior studies FINDINGS: Brain: No evidence of acute infarction, hemorrhage, hydrocephalus, extra-axial collection or mass effect. A 1 cm calcified meningioma within the RIGHT posterior fossa is unchanged and without adjacent parenchymal edema. Mild atrophy and mild chronic small-vessel white matter ischemic changes again noted. Vascular: Carotid atherosclerotic calcifications noted. Skull: Normal. Negative for fracture or focal lesion. Sinuses/Orbits: No acute finding. Other: None. IMPRESSION: 1. No evidence of acute  intracranial abnormality. 2. Mild atrophy and chronic small-vessel white matter ischemic changes. 3. Stable 1 cm calcified meningioma within the RIGHT posterior fossa. Electronically Signed   By: Margarette Canada M.D.   On: 04/10/2018 12:08   Dg Chest Port 1 View  Result Date: 04/13/2018 CLINICAL DATA:  Acute respiratory failure. EXAM: PORTABLE CHEST 1 VIEW COMPARISON:  Radiograph yesterday, as well as 04/10/2018 FINDINGS: Unchanged cardiomegaly and mediastinal contours. Progressive hazy opacities at the lung bases likely combination of pleural fluid and atelectasis. Pulmonary edema is similar to prior exam. No pneumothorax. IMPRESSION: Progressive hazy opacity at the lung bases likely pleural effusions and atelectasis. Unchanged cardiomegaly and pulmonary edema. Electronically Signed   By: Keith Rake M.D.   On: 04/13/2018 22:09   Dg Chest Port 1 View  Result Date: 04/12/2018 CLINICAL DATA:  Patient med with CHF.  Congestive heart failure. EXAM: PORTABLE CHEST 1 VIEW COMPARISON:  April 10, 2018 FINDINGS: Cardiomegaly. Mild edema. Opacity is no more focal in the right base and there may be an associated small layering effusion. No other change. IMPRESSION: 1.  Cardiomegaly and mild edema. 2. The more focal opacity in the right base is favored represent a small effusion and underlying atelectasis. A developing superimposed infiltrate is not excluded. Electronically Signed   By: Dorise Bullion III M.D   On: 04/12/2018 16:02   Dg Chest Port 1 View  Result Date: 04/10/2018 CLINICAL DATA:  Altered mental status. EXAM: PORTABLE CHEST 1 VIEW COMPARISON:  10/30/2017. FINDINGS: Cardiomegaly with mild pulmonary vascular prominence and bilateral interstitial prominence. Findings consistent with mild CHF. Small bilateral pleural effusions. No pneumothorax. IMPRESSION: Cardiomegaly with mild pulmonary interstitial prominence consistent with mild CHF. Electronically Signed   By: Marcello Moores  Register   On: 04/10/2018 12:11    Dg Foot Complete Right  Result Date: 04/10/2018 CLINICAL DATA:  Chronic wound of the right foot. EXAM: RIGHT FOOT COMPLETE - 3+ VIEW COMPARISON:  None. FINDINGS: No acute fracture or dislocation. Arthrodesis of the first MTP joint. Generalized osteopenia. No periosteal reaction or bone destruction. No soft tissue emphysema or radiopaque foreign body. Generalized soft tissue swelling of the right lower leg and foot. Peripheral vascular atherosclerotic disease. IMPRESSION: 1. No radiographic evidence of osteomyelitis of the right foot. Electronically Signed   By: Kathreen Devoid   On: 04/10/2018 15:28    Consults: Treatment Team:  Pccm, Ander Gaster, MD   Subjective:    Overnight Issues: patient had some improvement in mental status yesterday although still confused and agitated. Repeat CT scan of the head was performed which did not reveal any significant change from prior study. Presently is resting comfortably on BiPAP  Objective:  Vital signs for last 24 hours: Temp:  [98.9 F (37.2 C)-99.9 F (37.7 C)] 99.9 F (37.7 C) (09/10 0400) Pulse Rate:  [84-114] 90 (09/10 0630) Resp:  [14-26] 16 (09/10 0630) BP: (77-206)/(47-187) 151/79 (09/10 0630) SpO2:  [94 %-100 %] 98 % (09/10 0630) FiO2 (%):  [30 %-35 %] 30 % (09/09 2203)  Hemodynamic parameters for last 24 hours:    Intake/Output from previous day: 09/09 0701 - 09/10 0700 In: 516.5 [P.O.:240; I.V.:176.5; IV Piggyback:100.1] Out: 1000 [Urine:1000]  Intake/Output this shift: No intake/output data recorded.  Vent settings for last 24 hours: FiO2 (%):  [30 %-35 %] 30 %  Physical Exam:  Vital signs: Please see the above listed vital signs HEENT: Patient is presently wearing BiPAP, admitted oral exam, trachea midline Cardiovascular: Irregularly irregular rhythm with controlled ventricular response Abdominal: Positive bowel sounds, soft exam Extremity: Dressed right big toe ulcer, chronic peripheral vascular disease  changes Neruologic: Patient is encephalopathic, will minimally respond, moves all extremities  Assessment/Plan:   Acute hypercarbic respiratory failure requiring BiPAP. Will continue to wean as tolerated today. Tolerated off BiPAP yesterday with improved mentation. pon further questioning per husband patient has never drank, has no prior diagnosis of COPD/asthma/OSA/OHS.Thyroid function tests are within normal limits  Altered mental status with toxic metabolic encephalopathy hypercapnia. epeat CT scan did not show any acute change  Right foot ulcer.  No osteomyelitis changes on x-ray, history of peripheral artery disease status post right popliteal peroneal revascularization graft. Continue with wound care and management as per podiatry.  UTI.  E. coli her urine culture, on Rocephin  Uncontrolled diabetes mellitus sliding scale coverage  Critical Care Total Time 35 minutes  Ghina Bittinger 04/14/2018  *Care during the described time interval was provided by me and/or other providers on the critical care team.  I have reviewed this patient's available data, including medical history, events of note, physical examination and test results as part  of my evaluation. Patient ID: [name], female   DOB: 12/04/1940, 77 y.o.   MRN: [id number]

## 2018-04-14 NOTE — Progress Notes (Signed)
Inpatient Diabetes Program Recommendations  AACE/ADA: New Consensus Statement on Inpatient Glycemic Control (2019)  Target Ranges:  Prepandial:   less than 140 mg/dL      Peak postprandial:   less than 180 mg/dL (1-2 hours)      Critically ill patients:  140 - 180 mg/dL   Results for [name], White (MRN [id number]) as of 04/14/2018 08:44  Ref. Range 04/13/2018 07:24 04/13/2018 09:03 04/13/2018 11:46 04/13/2018 16:51 04/13/2018 21:58 04/14/2018 07:58  Glucose-Capillary Latest Ref Range: 70 - 99 mg/dL 262 (H) 213 (H) 227 (H) 194 (H) 247 (H) 262 (H)   Review of Glycemic Control  Diabetes history: DM2 Outpatient Diabetes medications: Lantus 20 units QAM, Lantus 28 units QHS, Novolog 0-15 units TID with meals Current orders for Inpatient glycemic control: Lantus 20 units QHS, Novolog 0-9 units TID with meals, Novolog 0-5 units QHS  Inpatient Diabetes Program Recommendations:  Insulin - Basal: Please consider ordering Lantus 10 units QAM (and continue Lantus 20 units QHS).  Thanks, Barnie Alderman, RN, MSN, CDE Diabetes Coordinator Inpatient Diabetes Program [phone] (Team Pager from 8am to 5pm)

## 2018-04-14 NOTE — Progress Notes (Signed)
SLP Cancellation Note  Patient Details Name: [name] MRN: [id number] DOB: 06-04-1941   Cancelled treatment:       Reason Eval/Treat Not Completed: Medical issues which prohibited therapy(chart reviewed; consulted NSG then met w/ pt/husband). Pt is currently on BiPAP for respiratory support. NSG reported pt attempted some of her lunch meal w/ min oral phase deficits noted - increased oral time and holding of meats.  ST will f/u in morning for further assessment during meal. NSG/Husband agreed.     Orinda Kenner, MS, CCC-SLP Watson,Katherine 04/14/2018, 2:50 PM

## 2018-04-14 NOTE — Progress Notes (Signed)
SLP Cancellation Note  Patient Details Name: ALEXIAS MARGERUM MRN: [id number] DOB: [birth year]   Cancelled treatment:       Reason Eval/Treat Not Completed: Medical issues which prohibited therapy(chart reviewed; consulted NSG ). Pt remains on BiPAP this morning. Will f/u later in AM when pt is off BiPAP. NSG agreed.    Orinda Kenner, MS, CCC-SLP Vasco Chong 04/14/2018, 8:59 AM

## 2018-04-15 ENCOUNTER — Ambulatory Visit: Payer: Medicare HMO | Admitting: Internal Medicine

## 2018-04-15 LAB — GLUCOSE, CAPILLARY
GLUCOSE-CAPILLARY: 274 mg/dL — AB (ref 70–99)
Glucose-Capillary: 315 mg/dL — ABNORMAL HIGH (ref 70–99)
Glucose-Capillary: 281 mg/dL — ABNORMAL HIGH (ref 70–99)
Glucose-Capillary: 232 mg/dL — ABNORMAL HIGH (ref 70–99)

## 2018-04-15 LAB — CULTURE, BLOOD (ROUTINE X 2)
CULTURE: NO GROWTH
Culture: NO GROWTH

## 2018-04-15 MED ORDER — INSULIN GLARGINE 100 UNIT/ML ~~LOC~~ SOLN
25.0000 [IU] | Freq: Every day | SUBCUTANEOUS | Status: DC
  Administered 2018-04-15 – 2018-04-16 (×2): 25 [IU] via SUBCUTANEOUS
  Filled 2018-04-15 (×4): qty 0.25

## 2018-04-15 MED ORDER — INSULIN GLARGINE 100 UNIT/ML ~~LOC~~ SOLN
10.0000 [IU] | Freq: Every day | SUBCUTANEOUS | Status: DC
  Administered 2018-04-15 – 2018-04-17 (×3): 10 [IU] via SUBCUTANEOUS
  Filled 2018-04-15 (×6): qty 0.1

## 2018-04-15 NOTE — Progress Notes (Signed)
Assumed care of patient. Agree with previous assessment.

## 2018-04-15 NOTE — Progress Notes (Signed)
Inpatient Diabetes Program Recommendations  AACE/ADA: New Consensus Statement on Inpatient Glycemic Control (2019)  Target Ranges:  Prepandial:   less than 140 mg/dL      Peak postprandial:   less than 180 mg/dL (1-2 hours)      Critically ill patients:  140 - 180 mg/dL   Results for MARCAYLA, BUDGE (MRN [id number]) as of 04/15/2018 12:01  Ref. Range 04/14/2018 07:58 04/14/2018 12:04 04/14/2018 16:54 04/14/2018 21:13 04/15/2018 07:30  Glucose-Capillary Latest Ref Range: 70 - 99 mg/dL 262 (H) 257 (H) 284 (H) 372 (H) 315 (H)   Review of Glycemic Control  Diabetes history:DM2 Outpatient Diabetes medications:Lantus 20 units QAM, Lantus 28 units QHS, Novolog 0-15 units TID with meals Current orders for Inpatient glycemic control:Lantus 10 units QAM (added this morning), Lantus 20 units QHS, Novolog 0-9 units TID with meals, Novolog 0-5 units QHS  Inpatient Diabetes Program Recommendations: Insulin - Basal: Please consider increasing bedtime dose of Lantus to 25 units QHS.  Thanks, Barnie Alderman, RN, MSN, CDE Diabetes Coordinator Inpatient Diabetes Program [phone] (Team Pager from 8am to 5pm)

## 2018-04-15 NOTE — Progress Notes (Signed)
Follow up - Critical Care Medicine Note  Patient Details:    [name] is an 77 y.o. female. with history of diabetes mellitus, diabetic neuropathy, aortic stenosis, heart failure, dyslipidemia, hypertension, peripheral artery disease, status post right popliteal to peroneal bypass, CVA, GERD and chronic ulcer right big toe.  Patient was admitted to the hospital with altered mental status and diagnosis of acute encephalopathy.  She was noticed to be more lethargic and able to communicate. ABG was remarkable for acute hypercapnic respiratory failure, ICU transfer was requested because of requirement to titrate BiPAP  Anti-infectives:  Anti-infectives (From admission, onward)   Start     Dose/Rate Route Frequency Ordered Stop   04/11/18 1000  cefTRIAXone (ROCEPHIN) 1 g in sodium chloride 0.9 % 100 mL IVPB     1 g 200 mL/hr over 30 Minutes Intravenous Every 24 hours 04/10/18 1427 04/18/18 0959   04/11/18 0400  vancomycin (VANCOCIN) IVPB 1000 mg/200 mL premix  Status:  Discontinued     1,000 mg 200 mL/hr over 60 Minutes Intravenous Every 18 hours 04/10/18 1907 04/13/18 1034   04/10/18 1730  vancomycin (VANCOCIN) 1,250 mg in sodium chloride 0.9 % 250 mL IVPB     1,250 mg 166.7 mL/hr over 90 Minutes Intravenous  Once 04/10/18 1700 04/10/18 2009   04/10/18 1515  vancomycin (VANCOCIN) IVPB 1000 mg/200 mL premix  Status:  Discontinued     1,000 mg 200 mL/hr over 60 Minutes Intravenous  Once 04/10/18 1504 04/10/18 1700   04/10/18 1345  cefTRIAXone (ROCEPHIN) 1 g in sodium chloride 0.9 % 100 mL IVPB     1 g 200 mL/hr over 30 Minutes Intravenous  Once 04/10/18 1343 04/10/18 1518      Microbiology: Results for orders placed or performed during the hospital encounter of 04/10/18  Urine Culture     Status: Abnormal   Collection Time: 04/10/18 12:41 PM  Result Value Ref Range Status   Specimen Description   Final    URINE, RANDOM Performed at [facility], [address]  [place], [place] [address]    Special Requests   Final    NONE Performed at [facility], [address] [place], [place] [address]    Culture 50,000 COLONIES/mL ESCHERICHIA COLI (A)  Final   Report Status 04/13/2018 FINAL  Final   Organism ID, Bacteria ESCHERICHIA COLI (A)  Final      Susceptibility   Escherichia coli - MIC*    AMPICILLIN <=2 SENSITIVE Sensitive     CEFAZOLIN <=4 SENSITIVE Sensitive     CEFTRIAXONE <=1 SENSITIVE Sensitive     CIPROFLOXACIN <=0.25 SENSITIVE Sensitive     GENTAMICIN <=1 SENSITIVE Sensitive     IMIPENEM 1 SENSITIVE Sensitive     NITROFURANTOIN 32 SENSITIVE Sensitive     TRIMETH/SULFA <=20 SENSITIVE Sensitive     AMPICILLIN/SULBACTAM <=2 SENSITIVE Sensitive     PIP/TAZO <=4 SENSITIVE Sensitive     Extended ESBL NEGATIVE Sensitive     * 50,000 COLONIES/mL ESCHERICHIA COLI  CULTURE, BLOOD (ROUTINE X 2) w Reflex to ID Panel     Status: None   Collection Time: 04/10/18  5:18 PM  Result Value Ref Range Status   Specimen Description BLOOD L HAND  Final   Special Requests   Final    BOTTLES DRAWN AEROBIC AND ANAEROBIC Blood Culture results may not be optimal due to an inadequate volume of blood received in culture bottles   Culture   Final  NO GROWTH 5 DAYS Performed at [facility], [address] [place], [place] [address]    Report Status 04/15/2018 FINAL  Final  CULTURE, BLOOD (ROUTINE X 2) w Reflex to ID Panel     Status: None   Collection Time: 04/10/18  6:11 PM  Result Value Ref Range Status   Specimen Description BLOOD BLOOD LEFT HAND  Final   Special Requests   Final    BOTTLES DRAWN AEROBIC ONLY Blood Culture results may not be optimal due to an inadequate volume of blood received in culture bottles   Culture   Final    NO GROWTH 5 DAYS Performed at [facility], [address] [place], [place] [address]    Report Status 04/15/2018 FINAL  Final  MRSA PCR Screening     Status: None   Collection Time: 04/14/18   9:40 AM  Result Value Ref Range Status   MRSA by PCR NEGATIVE NEGATIVE Final    Comment:        The GeneXpert MRSA Assay (FDA approved for NASAL specimens only), is one component of a comprehensive MRSA colonization surveillance program. It is not intended to diagnose MRSA infection nor to guide or monitor treatment for MRSA infections. Performed at [facility], [address], [place], [place] [address]    Studies: Ct Head Wo Contrast  Result Date: 04/13/2018 CLINICAL DATA:  Altered mental status.  Worsening confusion. EXAM: CT HEAD WITHOUT CONTRAST TECHNIQUE: Contiguous axial images were obtained from the base of the skull through the vertex without intravenous contrast. COMPARISON:  Head CT 04/10/2018 FINDINGS: Brain: Mild patient motion artifact. Unchanged degree of atrophy and chronic small vessel ischemia. Unchanged calcified right posterior fossa meningioma without significant mass effect. No intracranial hemorrhage. No midline shift. No hydrocephalus. The basilar cisterns are patent. No evidence of territorial infarct or acute ischemia. No extra-axial or intracranial fluid collection. Vascular: Atherosclerosis of skullbase vasculature without hyperdense vessel or abnormal calcification. Skull: No fracture or focal lesion. Sinuses/Orbits: No acute findings. Other: None. IMPRESSION: 1.  No acute intracranial abnormality. 2. Unchanged atrophy and chronic small vessel ischemia. 3. Unchanged calcified right posterior fossa meningioma without mass effect. Electronically Signed   By: Keith Rake M.D.   On: 04/13/2018 22:41   Ct Head Wo Contrast  Result Date: 04/10/2018 CLINICAL DATA:  77 year old female with altered mental status. EXAM: CT HEAD WITHOUT CONTRAST TECHNIQUE: Contiguous axial images were obtained from the base of the skull through the vertex without intravenous contrast. COMPARISON:  08/29/2016 CT and prior studies FINDINGS: Brain: No evidence of acute infarction,  hemorrhage, hydrocephalus, extra-axial collection or mass effect. A 1 cm calcified meningioma within the RIGHT posterior fossa is unchanged and without adjacent parenchymal edema. Mild atrophy and mild chronic small-vessel white matter ischemic changes again noted. Vascular: Carotid atherosclerotic calcifications noted. Skull: Normal. Negative for fracture or focal lesion. Sinuses/Orbits: No acute finding. Other: None. IMPRESSION: 1. No evidence of acute intracranial abnormality. 2. Mild atrophy and chronic small-vessel white matter ischemic changes. 3. Stable 1 cm calcified meningioma within the RIGHT posterior fossa. Electronically Signed   By: Margarette Canada M.D.   On: 04/10/2018 12:08   Dg Chest Port 1 View  Result Date: 04/13/2018 CLINICAL DATA:  Acute respiratory failure. EXAM: PORTABLE CHEST 1 VIEW COMPARISON:  Radiograph yesterday, as well as 04/10/2018 FINDINGS: Unchanged cardiomegaly and mediastinal contours. Progressive hazy opacities at the lung bases likely combination of pleural fluid and atelectasis. Pulmonary edema is similar to prior exam.  No pneumothorax. IMPRESSION: Progressive hazy opacity at the lung bases likely pleural effusions and atelectasis. Unchanged cardiomegaly and pulmonary edema. Electronically Signed   By: Keith Rake M.D.   On: 04/13/2018 22:09   Dg Chest Port 1 View  Result Date: 04/12/2018 CLINICAL DATA:  Patient med with CHF.  Congestive heart failure. EXAM: PORTABLE CHEST 1 VIEW COMPARISON:  April 10, 2018 FINDINGS: Cardiomegaly. Mild edema. Opacity is no more focal in the right base and there may be an associated small layering effusion. No other change. IMPRESSION: 1. Cardiomegaly and mild edema. 2. The more focal opacity in the right base is favored represent a small effusion and underlying atelectasis. A developing superimposed infiltrate is not excluded. Electronically Signed   By: Dorise Bullion III M.D   On: 04/12/2018 16:02   Dg Chest Port 1 View  Result  Date: 04/10/2018 CLINICAL DATA:  Altered mental status. EXAM: PORTABLE CHEST 1 VIEW COMPARISON:  10/30/2017. FINDINGS: Cardiomegaly with mild pulmonary vascular prominence and bilateral interstitial prominence. Findings consistent with mild CHF. Small bilateral pleural effusions. No pneumothorax. IMPRESSION: Cardiomegaly with mild pulmonary interstitial prominence consistent with mild CHF. Electronically Signed   By: Marcello Moores  Register   On: 04/10/2018 12:11   Dg Foot Complete Right  Result Date: 04/10/2018 CLINICAL DATA:  Chronic wound of the right foot. EXAM: RIGHT FOOT COMPLETE - 3+ VIEW COMPARISON:  None. FINDINGS: No acute fracture or dislocation. Arthrodesis of the first MTP joint. Generalized osteopenia. No periosteal reaction or bone destruction. No soft tissue emphysema or radiopaque foreign body. Generalized soft tissue swelling of the right lower leg and foot. Peripheral vascular atherosclerotic disease. IMPRESSION: 1. No radiographic evidence of osteomyelitis of the right foot. Electronically Signed   By: Kathreen Devoid   On: 04/10/2018 15:28    Consults: Treatment Team:  Pccm, Ander Gaster, MD Henreitta Leber, MD   Subjective:    Overnight Issues: patient had some improvement in mental status yesterday although still confused and agitated. Repeat CT scan of the head was performed which did not reveal any significant change from prior study. Presently is resting comfortably on BiPAP  Objective:  Vital signs for last 24 hours: Temp:  [98.2 F (36.8 C)-99.4 F (37.4 C)] 98.8 F (37.1 C) (09/11 0800) Pulse Rate:  [71-98] 89 (09/11 0800) Resp:  [16-32] 23 (09/11 0800) BP: (130-199)/(41-136) 164/61 (09/11 0800) SpO2:  [93 %-99 %] 97 % (09/11 0800)  Hemodynamic parameters for last 24 hours:    Intake/Output from previous day: 09/10 0701 - 09/11 0700 In: 1009.9 [P.O.:680; I.V.:236.4; IV Piggyback:93.5] Out: 2450 [Urine:2450]  Intake/Output this shift: No intake/output data  recorded.  Vent settings for last 24 hours:    Physical Exam:  Vital signs: Please see the above listed vital signs HEENT: Patient is presently wearing BiPAP, admitted oral exam, trachea midline Cardiovascular: Irregularly irregular rhythm with controlled ventricular response Abdominal: Positive bowel sounds, soft exam Extremity: Dressed right big toe ulcer, chronic peripheral vascular disease changes Neruologic: Patient is encephalopathic, will minimally respond, moves all extremities  Assessment/Plan:   Acute hypercarbic respiratory failure requiring BiPAP. Will continue to wean as tolerated today. Tolerated off BiPAP yesterday with improved mentation. pon further questioning per husband patient has never drank, has no prior diagnosis of COPD/asthma/OSA/OHS.Thyroid function tests are within normal limits  Altered mental status with toxic metabolic encephalopathy hypercapnia. epeat CT scan did not show any acute change  Right foot ulcer.  No osteomyelitis changes on x-ray, history of peripheral artery disease status post  right popliteal peroneal revascularization graft. Continue with wound care and management as per podiatry.  UTI.  E. coli her urine culture, on Rocephin  Uncontrolled diabetes mellitus sliding scale coverage, will adjust    Leesha Veno 04/15/2018  *Care during the described time interval was provided by me and/or other providers on the critical care team.  I have reviewed this patient's available data, including medical history, events of note, physical examination and test results as part of my evaluation. Patient ID: MONNIE GUDGEL, female   DOB: 02/10/1941, 77 y.o.   MRN: [id number] Patient ID: LAELYN BLUMENTHAL, female   DOB: 1940/09/13, 77 y.o.   MRN: [id number]

## 2018-04-15 NOTE — Progress Notes (Signed)
[facility] [place] [facility] [place] NAME: [name]    MR#:  [id number]  DATE OF BIRTH:  Nov 16, 1940  SUBJECTIVE:   Patient presented to the hospital due to altered mental status secondary to hypercapnic respiratory failure.  Mental status has improved, weaned off BiPAP as of this morning.  REVIEW OF SYSTEMS:    Review of Systems  Unable to perform ROS: Mental acuity    Nutrition: Dysphagia III with thin liquids Tolerating Diet: yes Tolerating PT: Await Eval.   DRUG ALLERGIES:   Allergies  Allergen Reactions  . Iodine Anaphylaxis  . Penicillins Anaphylaxis    Rec'd cefazolin 02/04/14 pre-op for femur ORIF Has patient had a PCN reaction causing immediate rash, facial/tongue/throat swelling, SOB or lightheadedness with hypotension: Unknown Has patient had a PCN reaction causing severe rash involving mucus membranes or skin necrosis: Unknown Has patient had a PCN reaction that required hospitalization: Unknown Has patient had a PCN reaction occurring within the last 10 years: Unknown If all of the above answers are "NO", then may proceed with Cephalosporin u  . Shellfish Allergy Anaphylaxis  . Shellfish-Derived Products Anaphylaxis  . Sulfa Antibiotics Anaphylaxis  . Sulfacetamide Sodium Anaphylaxis  . Sulfasalazine Anaphylaxis  . Atorvastatin     Other reaction(s): Unknown  . Morphine And Related Other (See Comments)    Altered mental status    VITALS:  Blood pressure (!) 172/65, pulse 91, temperature 98.8 F (37.1 C), temperature source Oral, resp. rate (!) 23, height 5\' 5"  (1.651 m), weight 106.1 kg, SpO2 95 %.  PHYSICAL EXAMINATION:   Physical Exam  GENERAL:  77 y.o.-year-old obese patient lying in bed with some tremor and confused.  EYES: Pupils equal, round, reactive to light and accommodation. No scleral icterus. Extraocular muscles intact.  HEENT: Head atraumatic, normocephalic. Oropharynx and nasopharynx clear.  NECK:  Supple, no  jugular venous distention. No thyroid enlargement, no tenderness.  LUNGS: Normal breath sounds bilaterally, no wheezing, rales, rhonchi. No use of accessory muscles of respiration.  CARDIOVASCULAR: S1, S2 normal. No murmurs, rubs, or gallops.  ABDOMEN: Soft, nontender, nondistended. Bowel sounds present. No organomegaly or mass.  EXTREMITIES: No cyanosis, clubbing or edema b/l.    NEUROLOGIC: Cranial nerves II through XII are intact. No focal Motor or sensory deficits b/l.  Globally weak with a + Tremor. PSYCHIATRIC: The patient is alert and oriented x 1.  SKIN: No obvious rash, lesion, or ulcer.    LABORATORY PANEL:   CBC Recent Labs  Lab 04/14/18 0424  WBC 10.1  HGB 13.2  HCT 40.7  PLT 134*   ------------------------------------------------------------------------------------------------------------------  Chemistries  Recent Labs  Lab 04/13/18 0553 04/14/18 0424  NA 141 144  K 5.0 4.1  CL 100 100  CO2 35* 35*  GLUCOSE 241* 293*  BUN 12 13  CREATININE 0.71 0.57  CALCIUM 9.2 9.4  MG 2.1  --   AST 16  --   ALT 22  --   ALKPHOS 59  --   BILITOT 0.8  --    ------------------------------------------------------------------------------------------------------------------  Cardiac Enzymes No results for input(s): TROPONINI in the last 168 hours. ------------------------------------------------------------------------------------------------------------------  RADIOLOGY:  Ct Head Wo Contrast  Result Date: 04/13/2018 CLINICAL DATA:  Altered mental status.  Worsening confusion. EXAM: CT HEAD WITHOUT CONTRAST TECHNIQUE: Contiguous axial images were obtained from the base of the skull through the vertex without intravenous contrast. COMPARISON:  Head CT 04/10/2018 FINDINGS: Brain: Mild patient motion artifact. Unchanged degree of atrophy and chronic small  vessel ischemia. Unchanged calcified right posterior fossa meningioma without significant mass effect. No intracranial  hemorrhage. No midline shift. No hydrocephalus. The basilar cisterns are patent. No evidence of territorial infarct or acute ischemia. No extra-axial or intracranial fluid collection. Vascular: Atherosclerosis of skullbase vasculature without hyperdense vessel or abnormal calcification. Skull: No fracture or focal lesion. Sinuses/Orbits: No acute findings. Other: None. IMPRESSION: 1.  No acute intracranial abnormality. 2. Unchanged atrophy and chronic small vessel ischemia. 3. Unchanged calcified right posterior fossa meningioma without mass effect. Electronically Signed   By: Keith Rake M.D.   On: 04/13/2018 22:41   Dg Chest Port 1 View  Result Date: 04/13/2018 CLINICAL DATA:  Acute respiratory failure. EXAM: PORTABLE CHEST 1 VIEW COMPARISON:  Radiograph yesterday, as well as 04/10/2018 FINDINGS: Unchanged cardiomegaly and mediastinal contours. Progressive hazy opacities at the lung bases likely combination of pleural fluid and atelectasis. Pulmonary edema is similar to prior exam. No pneumothorax. IMPRESSION: Progressive hazy opacity at the lung bases likely pleural effusions and atelectasis. Unchanged cardiomegaly and pulmonary edema. Electronically Signed   By: Keith Rake M.D.   On: 04/13/2018 22:09     ASSESSMENT AND PLAN:   77 year old female with past medical history of COPD, history of previous CVA, peripheral artery disease, hypertension, hyperlipidemia, GERD, diabetes, diabetic neuropathy, history of aortic stenosis who presents to the hospital due to altered mental status.  1.  Altered mental status- likely metabolic encephalopathy secondary to hypercapnic respiratory failure.  Patient does have a previous history of stroke but initially CT head was negative. - Weaned off BiPAP now his hypercapnia is improved mental status close to baseline.  2.  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 - No evidence of acute COPD exacerbation.  No wheezing  or bronchospasm.  Was on BiPAP, now weaned off and doing better.  3.  Urinary tract infection-based off urinalysis and urine culture.  Urine culture grew out 50,000 colonies of E. coli. -Continue IV ceftriaxone.  4.  Essential hypertension-continue Norvasc, Imdur, hydralazine as needed.  Continue losartan.  5.  Diabetes type 2 without complication-continue Lantus, sliding scale insulin.  6.  Hx of previous CVA-continue aspirin, Plavix, atorvastatin.  7.  Hyperlipidemia-continue atorvastatin.  Transfer to floor later today.  Await physical therapy evaluation.   All the records are reviewed and case discussed with Care Management/Social Worker. Management plans discussed with the patient, family and they are in agreement.  CODE STATUS: Full code  DVT Prophylaxis: Lovenox  TOTAL TIME TAKING CARE OF THIS PATIENT: 30 minutes.   POSSIBLE D/C IN 2-3 DAYS, DEPENDING ON CLINICAL CONDITION.   Henreitta Leber M.D on 04/15/2018 at 3:22 PM  Between 7am to 6pm - Pager - [phone]  After 6pm go to [email or web address] - Technical brewer  Hospitalists  Office  [phone]  CC: Primary care physician; [name], MD

## 2018-04-15 NOTE — Evaluation (Signed)
Clinical/Bedside Swallow Evaluation Patient Details  Name: [name] MRN: [id number] Date of Birth: 23-Sep-1940  Today's Date: 04/15/2018 Time: SLP Start Time (ACUTE ONLY): 1215 SLP Stop Time (ACUTE ONLY): 1315 SLP Time Calculation (min) (ACUTE ONLY): 60 min  Past Medical History:  Past Medical History:  Diagnosis Date  . Acute heart failure ([place])   . Aortic stenosis   . Complication of anesthesia    Hard to wake patient up after having anesthesia  . Diabetes mellitus without complication ([place])   . Diabetic neuropathy ([facility])    Takes Lyrica  . Edema of both legs    Takes Lasix  . GERD (gastroesophageal reflux disease)   . High cholesterol   . HTN (hypertension)   . Hypertension   . PAD (peripheral artery disease) ([place])   . Shortness of breath dyspnea   . Spasm of back muscles   . Stroke ([place])   . Wound, open    Right great toe   Past Surgical History:  Past Surgical History:  Procedure Laterality Date  . BYPASS GRAFT POPLITEAL TO TIBIAL Right 02/28/2016   Procedure: BYPASS GRAFT RIGHT BELOW KNEE POPLITEAL TO PERONEAL USING REVERSED RIGHT GREATER SAPHENOUS VEIN;  Surgeon: Conrad L'Anse, MD;  Location: [place];  Service: Vascular;  Laterality: Right;  . CYST EXCISION     Abdomen  . EYE SURGERY Bilateral    Cataract removal  . INTRAMEDULLARY (IM) NAIL INTERTROCHANTERIC Left 02/04/2014   Procedure: INTRAMEDULLARY (IM) NAIL INTERTROCHANTRIC FEMORAL;  Surgeon: Mauri Pole, MD;  Location: [place];  Service: Orthopedics;  Laterality: Left;  . IR GENERIC HISTORICAL  03/01/2016   IR ANGIO INTRA EXTRACRAN SEL COM CAROTID INNOMINATE UNI R MOD SED 03/01/2016 [name], MD [facility]  . IR GENERIC HISTORICAL  03/01/2016   IR ENDOVASC INTRACRANIAL INF OTHER THAN THROMBO ART INC DIAG ANGIO 03/01/2016 [name], MD [facility]  . IR GENERIC HISTORICAL  03/01/2016   IR INTRAVSC STENT CERV CAROTID W/O EMB-PROT MOD SED INC ANGIO 03/01/2016 [name], MD [facility]  .  IR GENERIC HISTORICAL  06/03/2016   IR RADIOLOGIST EVAL & MGMT 06/03/2016 [facility]  . ORIF TOE FRACTURE Right 02/08/2014   Procedure: OPEN REDUCTION INTERNAL FIXATION Right METATARSAL  FRACTURE ;  Surgeon: Wylene Simmer, MD;  Location: [facility];  Service: Orthopedics;  Laterality: Right;  . PERIPHERAL VASCULAR CATHETERIZATION N/A 12/28/2015   Procedure: Abdominal Aortogram w/Lower Extremity;  Surgeon: Conrad Campbell, MD;  Location: [facility];  Service: Cardiovascular;  Laterality: N/A;  . RADIOLOGY WITH ANESTHESIA N/A 03/01/2016   Procedure: RADIOLOGY WITH ANESTHESIA;  Surgeon: [name], MD;  Location: [facility];  Service: Radiology;  Laterality: N/A;  . VEIN HARVEST Right 02/28/2016   Procedure: RIGHT GREATER SAPHENOUS VEIN HARVEST;  Surgeon: Conrad St. [place]s, MD;  Location: [place];  Service: Vascular;  Laterality: Right;   HPI:  Pt is an 77 y.o. female. 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  Patient was admitted to the hospital with altered mental status and diagnosis of acute encephalopathy; lethargy.  She was noticed to become more lethargic and required ICU transfer because of requirement to titrate BiPAP.  She remains drowsy w/ min improved mentation per MD but appears to remain confused w/ Cognitive decline.  She often called out repeatedly for "iced tea, cold cold  cold iced tea out of the refrigerator".  Pt did not make any attempt to feed self and drank impulsively when offered the cup.    Assessment / Plan / Recommendation Clinical Impression  Pt appears to present w/ fairly adequate oropharyngeal phase swallowing function w/ Min. increased oral phase time for bolus management and mastication b/f swallowing/clearing - unsure if this is related to her declined Cognitive status. In  addition, pt did NOT want food trials, she just wanted to drink Iced Tea and called out loudly for it repeatedly. Pt appears at reduced risk for aspiration when following general aspiration precautions including small bites of food fed to her at a time, check for oral clearing. Pt required setup and positioning assistance then FULL assistance w/ feeding of thin liquids via Straw and purees/soft solids w/ no immediate, overt s/s of aspiration noted; clear vocal quality noted b/t trials and no decline in respiratory status post trials(O2 sats remained 96-98%). Oral phase c/b increased mastication time/effort w/ increased textured foods; min increased oral phase time for bolus management. Noted pt often munched on the textured boluses w/ her front teeth as well as posteriorly b/f swallowing. She was effective w/ all bolus consistencies. Pt often REFUSED the foods requesting "cold" foods - the peaches were cold and cut small but she declined after ~4-5 bites. FULL assistance given to feed pt; encouragement to take po trials as pt only wanted to drink "iced tea". Pt's Cognitive status is declined at this time as noted in her verbal engagement w/ SLP. Unsure of pt's baseline Cognitive status - pt required Mod.+ cues for follow through w/ tasks; she only wanted to drink "Iced tea". Recommend a mech soft diet w/ thin liquids w/ general aspiration precautions. Pills Whole/Crushed in Puree for easier swallowing as needed. Tray setup at meals. Recommend ST f/u for formal Cognitive assessment to address awareness of safety issues, responses if pt's Cognitive status does not improve.  SLP Visit Diagnosis: Dysphagia, oral phase (R13.11)(declined Cognitive status)    Aspiration Risk  Mild aspiration risk(w/ declined Cognitive status currently)    Diet Recommendation  Dysphagia level 3(mech soft) w/ Thin liquids; aspiration precautions; feeding assistance at meals  Medication Administration: Whole meds with puree(as able to  tolerate - may need Crushed)    Other  Recommendations Recommended Consults: (Dietician f/u d/t decreased intake(food)) Oral Care Recommendations: Oral care BID;Staff/trained caregiver to provide oral care Other Recommendations: (n/a)   Follow up Recommendations None      Frequency and Duration (n/a)  (n/a)       Prognosis Prognosis for Safe Diet Advancement: Fair(-Good) Barriers to Reach Goals: Cognitive deficits;Behavior;Severity of deficits      Swallow Study   General Date of Onset: 04/10/18 HPI: Pt is an 77 y.o. female. 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  Patient was admitted to the hospital with altered mental status and diagnosis of acute encephalopathy; lethargy.  She was noticed to become more lethargic and required ICU transfer because of requirement to titrate BiPAP.  She remains drowsy w/ min improved mentation per MD but appears to remain confused w/ Cognitive decline.  She often called out repeatedly for "iced tea, cold cold cold iced tea out of the refrigerator".  Pt did not make any attempt to feed self and drank impulsively when offered the cup.  Type of Study:  Bedside Swallow Evaluation Previous Swallow Assessment: none noted in chart Diet Prior to this Study: Regular;Thin liquids Temperature Spikes Noted: (wbc 10.1; temp 99.4) Respiratory Status: Nasal cannula(2 liters) History of Recent Intubation: No Behavior/Cognition: Confused;Distractible;Requires cueing;Doesn't follow directions(Awake) Oral Cavity Assessment: Within Functional Limits(appeared) Oral Care Completed by SLP: Recent completion by staff Oral Cavity - Dentition: Adequate natural dentition Vision: (n/a) Self-Feeding Abilities: Total assist(did not attempt to assist w/ self-feeding) Patient  Positioning: Upright in bed(needed positioning) Baseline Vocal Quality: Normal Volitional Cough: Cognitively unable to elicit Volitional Swallow: Unable to elicit    Oral/Motor/Sensory Function Overall Oral Motor/Sensory Function: Within functional limits(appeared so w/ bolus management and clearing)   Ice Chips Ice chips: Not tested   Thin Liquid Thin Liquid: Within functional limits Presentation: Straw(fed; ~10+ ozs) Other Comments: drank impulsively    Nectar Thick Nectar Thick Liquid: Not tested   Honey Thick Honey Thick Liquid: Not tested   Puree Puree: Within functional limits Presentation: Spoon(fed; 5 trials)   Solid     Solid: Impaired Presentation: Spoon(fed; 8 trials) Oral Phase Impairments: Impaired mastication(increased munching and grinding w/ increased textures) Oral Phase Functional Implications: Impaired mastication;Prolonged oral transit;Oral residue(scattered) Pharyngeal Phase Impairments: (none) Other Comments: w/ time, pt was able to clear trial residue      Orinda Kenner, MS, CCC-SLP Watson,Katherine 04/15/2018,2:02 PM

## 2018-04-15 NOTE — Progress Notes (Signed)
Report called to Ander Purpura, RN on med/surg room 201. Pt has been transferred to room 201 via bed on telemetry.

## 2018-04-16 LAB — BASIC METABOLIC PANEL
ANION GAP: 6 (ref 5–15)
BUN: 15 mg/dL (ref 8–23)
CALCIUM: 9.1 mg/dL (ref 8.9–10.3)
CO2: 40 mmol/L — AB (ref 22–32)
CREATININE: 0.55 mg/dL (ref 0.44–1.00)
Chloride: 97 mmol/L — ABNORMAL LOW (ref 98–111)
GFR calc Af Amer: >60 mL/min (ref >60)
GFR calc non Af Amer: >60 mL/min (ref >60)
Glucose, Bld: 215 mg/dL — ABNORMAL HIGH (ref 70–99)
POTASSIUM: 3.3 mmol/L — AB (ref 3.5–5.1)
Sodium: 143 mmol/L (ref 135–145)

## 2018-04-16 LAB — GLUCOSE, CAPILLARY
GLUCOSE-CAPILLARY: 345 mg/dL — AB (ref 70–99)
Glucose-Capillary: 186 mg/dL — ABNORMAL HIGH (ref 70–99)
Glucose-Capillary: 216 mg/dL — ABNORMAL HIGH (ref 70–99)
Glucose-Capillary: 323 mg/dL — ABNORMAL HIGH (ref 70–99)

## 2018-04-16 MED ORDER — POTASSIUM CHLORIDE 20 MEQ PO PACK
40.0000 meq | PACK | Freq: Once | ORAL | Status: AC
  Administered 2018-04-16: 40 meq via ORAL
  Filled 2018-04-16: qty 2

## 2018-04-16 MED ORDER — ADULT MULTIVITAMIN W/MINERALS CH
1.0000 | ORAL_TABLET | Freq: Every day | ORAL | Status: DC
  Administered 2018-04-16 – 2018-04-17 (×2): 1 via ORAL
  Filled 2018-04-16 (×3): qty 1

## 2018-04-16 MED ORDER — ENSURE ENLIVE PO LIQD
237.0000 mL | Freq: Two times a day (BID) | ORAL | Status: DC
  Administered 2018-04-16 – 2018-04-17 (×2): 237 mL via ORAL

## 2018-04-16 MED ORDER — OCUVITE-LUTEIN PO CAPS
1.0000 | ORAL_CAPSULE | Freq: Every day | ORAL | Status: DC
  Administered 2018-04-16 – 2018-04-17 (×2): 1 via ORAL
  Filled 2018-04-16 (×2): qty 1

## 2018-04-16 NOTE — Progress Notes (Signed)
Initial Nutrition Assessment  DOCUMENTATION CODES:   Obesity unspecified  INTERVENTION:   Ensure Enlive po BID, each supplement provides 350 kcal and 20 grams of protein  MVI daily  Ocuvite daily for wound healing (provides zinc, vitamin A, vitamin C, Vitamin E, copper, and selenium)  NUTRITION DIAGNOSIS:   Inadequate oral intake related to acute illness as evidenced by meal completion < 25%.  GOAL:   Patient will meet greater than or equal to 90% of their needs  MONITOR:   PO intake, Supplement acceptance, Labs, Weight trends, Skin, I & O's  REASON FOR ASSESSMENT:   Consult Assessment of nutrition requirement/status  ASSESSMENT:   77 year old female with past medical history of COPD, history of previous CVA, peripheral artery disease, hypertension, hyperlipidemia, GERD, diabetes, diabetic neuropathy, history of aortic stenosis who presents to the hospital due to altered mental status.  Met with pt in room today. Pt is a poor historian but reports poor appetite and oral intake today and pta. Pt ate only bites of her breakfast this morning. Pt reports being thirsty today. Pt seen by SLP and placed on a dysphagia 3 diet. Pt reports that's she has never had supplements but she prefers strawberry and vanilla flavors. Pt also prefers supplements served cold. Per chart, pt appears weight stable pta but there is only a few weight measurements available to assess. RD will order supplements and vitamins to encourage wound healing and help pt meet her estimated needs.    Medications reviewed and include: aspirin, vitamin D, plavix, colace, lovenox, insulin, protonix, KCl, miralax  Labs reviewed: K 3.3(L), Cl 97(L) cbgs- 281, 274, 232, 186, 216 x 24 hrs AIC 8.8(H)  NUTRITION - FOCUSED PHYSICAL EXAM:    Most Recent Value  Orbital Region  No depletion  Upper Arm Region  No depletion  Thoracic and Lumbar Region  No depletion  Buccal Region  No depletion  Temple Region  No  depletion  Clavicle Bone Region  No depletion  Clavicle and Acromion Bone Region  No depletion  Scapular Bone Region  No depletion  Dorsal Hand  No depletion  Patellar Region  No depletion  Anterior Thigh Region  No depletion  Posterior Calf Region  No depletion  Edema (RD Assessment)  Mild  Hair  Reviewed  Eyes  Reviewed  Mouth  Reviewed  Skin  Reviewed  Nails  Reviewed     Diet Order:   Diet Order            DIET DYS 3 Room service appropriate? Yes with Assist; Fluid consistency: Thin  Diet effective now             EDUCATION NEEDS:   Not appropriate for education at this time  Skin:  Skin Assessment: Reviewed RN Assessment(Diabetic foot wound )  Last BM:  9/9- type 5  Height:   Ht Readings from Last 1 Encounters:  04/12/18 '5\' 5"'  (1.651 m)    Weight:   Wt Readings from Last 1 Encounters:  04/12/18 106.1 kg    Ideal Body Weight:  56.8 kg  BMI:  Body mass index is 38.92 kg/m.  Estimated Nutritional Needs:   Kcal:  1700-2000kcal/day   Protein:  106-116g/day   Fluid:  >1.7L/day   Koleen Distance MS, RD, LDN Pager #- [phone] Office#- [phone] After Hours Pager: [phone]

## 2018-04-16 NOTE — Progress Notes (Signed)
Clinical Education officer, museum (CSW) contacted patient's husband [name] and presented bed offers. He chose [facility]. CSW started [place] authorization today. [facility] admissions coordinator at [facility] is aware of above.   [employer], LCSW [phone]

## 2018-04-16 NOTE — Progress Notes (Signed)
Inpatient Diabetes Program Recommendations  AACE/ADA: New Consensus Statement on Inpatient Glycemic Control (2019)  Target Ranges:  Prepandial:   less than 140 mg/dL      Peak postprandial:   less than 180 mg/dL (1-2 hours)      Critically ill patients:  140 - 180 mg/dL  Results for ERCELL, PERLMAN (MRN [id number]) as of 04/16/2018 09:29  Ref. Range 04/15/2018 07:30 04/15/2018 12:00 04/15/2018 16:09 04/15/2018 21:25 04/16/2018 07:55  Glucose-Capillary Latest Ref Range: 70 - 99 mg/dL 315 (H) 281 (H) 274 (H) 232 (H) 186 (H)    Review of Glycemic Control  Diabetes history:DM2 Outpatient Diabetes medications:Lantus 20 units QAM, Lantus 28 units QHS, Novolog 0-15 units TID with meals Current orders for Inpatient glycemic control:Lantus 10 units QAM, Lantus 25 units QHS, Novolog 0-9 units TID with meals, Novolog 0-5 units QHS  Inpatient Diabetes Program Recommendations: Insulin-Meal Coverage: If post prandial glucose is consistently greater than 180 mg/dl, please consider ordering Novolog 3 units TID with meals for meal coverage if patient eats at lest 50% of meals.  Thanks, Barnie Alderman, RN, MSN, CDE Diabetes Coordinator Inpatient Diabetes Program [phone] (Team Pager from 8am to 5pm)

## 2018-04-16 NOTE — Progress Notes (Signed)
[facility] NAME: [name]    MR#:  [id number]  DATE OF BIRTH:  04-11-1941  SUBJECTIVE:   No acute events overnight, off BiPAP and respiratory status stable.  Still remains somewhat confused.  REVIEW OF SYSTEMS:    Review of Systems  Unable to perform ROS: Mental acuity    Nutrition: Dysphagia III with thin liquids Tolerating Diet: yes Tolerating PT: Eval noted.   DRUG ALLERGIES:   Allergies  Allergen Reactions  . Iodine Anaphylaxis  . Penicillins Anaphylaxis    Tolerates ceftriaxone, cefazolin Has patient had a PCN reaction causing immediate rash, facial/tongue/throat swelling, SOB or lightheadedness with hypotension: Unknown Has patient had a PCN reaction causing severe rash involving mucus membranes or skin necrosis: Unknown Has patient had a PCN reaction that required hospitalization: Unknown Has patient had a PCN reaction occurring within the last 10 years: Unknown If all of the above answers are "NO", then may proceed with Cephalosporin u  . Shellfish Allergy Anaphylaxis  . Shellfish-Derived Products Anaphylaxis  . Sulfa Antibiotics Anaphylaxis  . Sulfacetamide Sodium Anaphylaxis  . Sulfasalazine Anaphylaxis  . Atorvastatin     Other reaction(s): Unknown  . Morphine And Related Other (See Comments)    Altered mental status    VITALS:  Blood pressure (!) 138/39, pulse 82, temperature 97.9 F (36.6 C), temperature source Oral, resp. rate 16, height 5\' 5"  (1.651 m), weight 106.1 kg, SpO2 98 %.  PHYSICAL EXAMINATION:   Physical Exam  GENERAL:  77 y.o.-year-old obese patient lying in bed with some tremor and confused.  EYES: Pupils equal, round, reactive to light and accommodation. No scleral icterus. Extraocular muscles intact.  HEENT: Head atraumatic, normocephalic. Oropharynx and nasopharynx clear.  NECK:  Supple, no jugular venous distention. No thyroid enlargement, no tenderness.  LUNGS: Normal breath  sounds bilaterally, no wheezing, rales, rhonchi. No use of accessory muscles of respiration.  CARDIOVASCULAR: S1, S2 normal. No murmurs, rubs, or gallops.  ABDOMEN: Soft, nontender, nondistended. Bowel sounds present. No organomegaly or mass.  EXTREMITIES: No cyanosis, clubbing or edema b/l.    NEUROLOGIC: Cranial nerves II through XII are intact. No focal Motor or sensory deficits b/l.  Globally weak with a + Tremor. PSYCHIATRIC: The patient is alert and oriented x 1.  SKIN: No obvious rash, lesion, or ulcer.    LABORATORY PANEL:   CBC Recent Labs  Lab 04/14/18 0424  WBC 10.1  HGB 13.2  HCT 40.7  PLT 134*   ------------------------------------------------------------------------------------------------------------------  Chemistries  Recent Labs  Lab 04/13/18 0553  04/16/18 0350  NA 141   < > 143  K 5.0   < > 3.3*  CL 100   < > 97*  CO2 35*   < > 40*  GLUCOSE 241*   < > 215*  BUN 12   < > 15  CREATININE 0.71   < > 0.55  CALCIUM 9.2   < > 9.1  MG 2.1  --   --   AST 16  --   --   ALT 22  --   --   ALKPHOS 59  --   --   BILITOT 0.8  --   --    < > = values in this interval not displayed.   ------------------------------------------------------------------------------------------------------------------  Cardiac Enzymes No results for input(s): TROPONINI in the last 168 hours. ------------------------------------------------------------------------------------------------------------------  RADIOLOGY:  No results found.   ASSESSMENT AND PLAN:   77 year old female with past medical history  of COPD, history of previous CVA, peripheral artery disease, hypertension, hyperlipidemia, GERD, diabetes, diabetic neuropathy, history of aortic stenosis who presents to the hospital due to altered mental status.  1.  Altered mental status- likely metabolic encephalopathy secondary to hypercapnic respiratory failure.  Patient does have a previous history of stroke but  CT head  was negative on admission. - Weaned off BiPAP now his hypercapnia has improved and mental status close to baseline.  2.  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 - No evidence of acute COPD exacerbation.  No wheezing or bronchospasm.  Was on BiPAP, now weaned off and doing better.  3.  Urinary tract infection-based off urinalysis and urine culture.  Urine culture grew out 50,000 colonies of E. coli. - finished treatment with IV Ceftriaxone.   4.  Essential hypertension-continue Norvasc, Imdur, Losartan, and hydralazine as needed.    5.  Diabetes type 2 without complication-continue Lantus, sliding scale insulin.  6.  Hx of previous CVA-continue aspirin, Plavix, atorvastatin.  7.  Hyperlipidemia-continue atorvastatin.  Await physical therapy evaluation but patient likely will need short-term rehab upon discharge.  Discussed with social work.   All the records are reviewed and case discussed with Care Management/Social Worker. Management plans discussed with the patient, family and they are in agreement.  CODE STATUS: Full code  DVT Prophylaxis: Lovenox  TOTAL TIME TAKING CARE OF THIS PATIENT: 30 minutes.   POSSIBLE D/C IN 2-3 DAYS, DEPENDING ON CLINICAL CONDITION.   Henreitta Leber M.D on 04/16/2018 at 2:52 PM  Between 7am to 6pm - Pager - [phone]  After 6pm go to [email or web address] - Technical brewer [facility]  Office  [phone]  CC: Primary care physician; [name], MD

## 2018-04-16 NOTE — Evaluation (Signed)
Physical Therapy Re-Evaluation Patient Details Name: [name] MRN: [id number] DOB: 07-08-1941 Today's Date: 04/16/2018   History of Present Illness  Pt is a 77 y.o. female brought in by family for altered mental status.  Family stated that the pt was altered and that normally she can get up and walk with a walker but she was very weak and almost fell down.  They tried to get her pull up off but she had a bowel movement on the floor.  In the ER she was found to have a positive urine analysis and hospitalist services were contacted for further evaluation.  Pt diagnosed with Acute encephalopathy with acute cystitis, non-healing R dorsal foot ulcer, HTN, acute CHF with LE edema, and aortic stenosis on 04/10/18.  Pt admitted to the ICU 04/12/18 to titrate BiPAP secondary to acute hypercapnic respiratory failure.  Assessment now includes: Altered mental status- likely metabolic encephalopathy secondary to hypercapnic respiratory failure, Hypercapnic respiratory failure-secondary to underlying COPD with sleep apnea, UTI, HTN, DM, HLD, and h/o CVA.        Clinical Impression  Pt presents with deficits in strength, transfers, mobility, gait, balance, and activity tolerance.  Pt was lethargic and confused but level of alertness improved somewhat during session.  Pt was unable to provide history or PLOF during this re-evaluation with below history copied from recent previous PT evaluation.  Pt required +2 total assist with sup to/from sit and once sitting at the EOB required frequent min-mod A to prevent posterior LOB.  Pt was able to follow simple commands with extra cueing while participating in below therex.  Pt will benefit from PT services in a SNF setting upon discharge to safely address above deficits for decreased caregiver assistance and eventual return to PLOF.      Follow Up Recommendations SNF    Equipment Recommendations  Other (comment)(TBD at next venue of care)    Recommendations for Other  Services       Precautions / Restrictions Precautions Precautions: Fall Restrictions Weight Bearing Restrictions: No      Mobility  Bed Mobility Overal bed mobility: Needs Assistance Bed Mobility: Supine to Sit;Sit to Supine     Supine to sit: Total assist;+2 for physical assistance Sit to supine: Total assist;+2 for physical assistance   General bed mobility comments: Pt unable to provide assistance during sup to/from sit  Transfers                 General transfer comment: Unable/unsafe to attempt  Ambulation/Gait             General Gait Details: Unable/unsafe to attempt  Stairs            Wheelchair Mobility    Modified Rankin (Stroke Patients Only)       Balance Overall balance assessment: Needs assistance Sitting-balance support: Feet supported Sitting balance-Leahy Scale: Poor Sitting balance - Comments: Pt able to maintain sitting balance at the EOB 20% of the time with SBA only and 80% of the time with min-mod A to prevent LOB       Standing balance comment: Unable                             Pertinent Vitals/Pain Pain Assessment: No/denies pain Pain Intervention(s): Monitored during session    Home Living Family/patient expects to be discharged to:: Skilled nursing facility(Pt unable to provide history secondary to cognition, no family present; history as follows is  from previous PT eval) Living Arrangements: Spouse/significant other;Children Available Help at Discharge: Family;Available 24 hours/day Type of Home: House         Home Equipment: Walker - 2 wheels      Prior Function Level of Independence: Needs assistance   Gait / Transfers Assistance Needed: RW per pt     Comments: Pt not able to give information     Hand Dominance        Extremity/Trunk Assessment   Upper Extremity Assessment Upper Extremity Assessment: Generalized weakness    Lower Extremity Assessment Lower Extremity  Assessment: Generalized weakness       Communication   Communication: HOH  Cognition Arousal/Alertness: Lethargic Behavior During Therapy: Flat affect Overall Cognitive Status: No family/caregiver present to determine baseline cognitive functioning                                        General Comments      Exercises Total Joint Exercises Ankle Circles/Pumps: AROM;Both;10 reps(Verbal and tactile cues required for technique during below therex) Quad Sets: AROM;AAROM;Both;5 reps Heel Slides: AAROM;Both;5 reps Hip ABduction/ADduction: AAROM;Both;5 reps Straight Leg Raises: AAROM;Both;5 reps Long Arc Quad: AROM;AAROM;Both;10 reps Knee Flexion: AROM;Both;10 reps Other Exercises Other Exercises: Static sitting balance/core therex at the EOB with extensive verbal and tactile cues for maintaining good sitting posture and stability   Assessment/Plan    PT Assessment Patient needs continued PT services  PT Problem List Decreased strength;Decreased activity tolerance;Decreased balance;Decreased mobility       PT Treatment Interventions Gait training;Functional mobility training;Balance training;Therapeutic exercise;Therapeutic activities;Patient/family education;DME instruction    PT Goals (Current goals can be found in the Care Plan section)  Acute Rehab PT Goals PT Goal Formulation: Patient unable to participate in goal setting Time For Goal Achievement: 04/29/18 Potential to Achieve Goals: Fair    Frequency Min 2X/week   Barriers to discharge Other (comment) Pt dependent with functional mobility during PT evaluation    Co-evaluation               AM-PAC PT "6 Clicks" Daily Activity  Outcome Measure Difficulty turning over in bed (including adjusting bedclothes, sheets and blankets)?: Unable Difficulty moving from lying on back to sitting on the side of the bed? : Unable Difficulty sitting down on and standing up from a chair with arms (e.g.,  wheelchair, bedside commode, etc,.)?: Unable Help needed moving to and from a bed to chair (including a wheelchair)?: Total Help needed walking in hospital room?: Total Help needed climbing 3-5 steps with a railing? : Total 6 Click Score: 6    End of Session Equipment Utilized During Treatment: Oxygen Activity Tolerance: Patient limited by fatigue;Patient limited by lethargy Patient left: in bed;with call bell/phone within reach;with bed alarm set Nurse Communication: Mobility status PT Visit Diagnosis: Muscle weakness (generalized) (M62.81);Difficulty in walking, not elsewhere classified (R26.2)    Time: 1410-1435 PT Time Calculation (min) (ACUTE ONLY): 25 min   Charges:   PT Evaluation $PT Re-evaluation: 1 Re-eval PT Treatments $Therapeutic Exercise: 8-22 mins        D. Royetta Asal PT, DPT 04/16/18, 3:47 PM

## 2018-04-16 NOTE — Care Management Note (Signed)
Case Management Note  Patient Details  Name: [name] MRN: [id number] Date of Birth: 1941/06/19  Subjective/Objective:                 Patient is currently open to [facility] agency receiving RN PT OT Aide and SLP   Action/Plan:  Notified agency of admission and that has transferred out of icu  Expected Discharge Date:  04/14/18               Expected Discharge Plan:     In-House Referral:     Discharge planning Services     Post Acute Care Choice:    Choice offered to:     DME Arranged:    DME Agency:     HH Arranged:    [place] Agency:     Status of Service:     If discussed at [facility] [facility] [employer], dates discussed:    Additional Comments:  Katrina Stack, RN 04/16/2018, 8:10 AM

## 2018-04-16 NOTE — Care Management Important Message (Signed)
Copy of signed IM left with patient in room.  

## 2018-04-16 NOTE — Progress Notes (Signed)
Clinical Education officer, museum (CSW) faxed today's PT and MD note to [facility] as requested.   [employer], LCSW [phone]

## 2018-04-16 NOTE — Clinical Social Work Placement (Signed)
   CLINICAL SOCIAL WORK PLACEMENT  NOTE  Date:  04/16/2018  Patient Details  Name: [name] MRN: [id number] Date of Birth: Nov 24, 1940  Clinical Social Work is seeking post-discharge placement for this patient at the [place] level of care (*CSW will initial, date and re-position this form in  chart as items are completed):  Yes   Patient/family provided with [place] Work Department's list of facilities offering this level of care within the geographic area requested by the patient (or if unable, by the patient's family).  Yes   Patient/family informed of their freedom to choose among providers that offer the needed level of care, that participate in Medicare, Medicaid or managed care program needed by the patient, have an available bed and are willing to accept the patient.  Yes   Patient/family informed of [facility]'s ownership interest in [facility] and [facility], as well as of the fact that they are under no obligation to receive care at these facilities.  PASRR submitted to EDS on       PASRR number received on       Existing PASRR number confirmed on 04/11/18     FL2 transmitted to all facilities in geographic area requested by pt/family on 04/11/18     FL2 transmitted to all facilities within larger geographic area on       Patient informed that his/her managed care company has contracts with or will negotiate with certain facilities, including the following:        Yes   Patient/family informed of bed offers received.  Patient chooses bed at [facility] )     Physician recommends and patient chooses bed at      Patient to be transferred to   on  .  Patient to be transferred to facility by       Patient family notified on   of transfer.  Name of family member notified:        PHYSICIAN       Additional Comment:    _______________________________________________ Latrice Storlie, Veronia Beets, LCSW 04/16/2018,  10:35 AM

## 2018-04-16 NOTE — Progress Notes (Signed)
[facility] requested more recent PT notes. PT order has been placed. Clinical Education officer, museum (CSW) will sent PT note to [facility] when available.   [employer], LCSW [phone]

## 2018-04-16 NOTE — Progress Notes (Signed)
Speech Language Pathology Treatment: Dysphagia  Patient Details Name: [name] MRN: [id number] DOB: 12/06/1940 Today's Date: 04/16/2018 Time: 4098-1191 SLP Time Calculation (min) (ACUTE ONLY): 37 min  Assessment / Plan / Recommendation Clinical Impression  Pt seen for ongoing assessment of toleration of diet; trials of po's; family education - Son and Husband were present this evening. Pt awake, more alert per family. Pt verbally responsive and indicating her wants/needs w/ regard to eating/drinking. Pt did require verbal encouragement to take a little more po(foods) during the meal - she did drink Ensure readily. Pt's Cognitive status continues to appear min declined.  Pt consumed trials of thin liquids via cup/Straw and food trials of puree and mech soft w/ no overt s/s of aspiration noted; no decline in respiratory status post trials. Vocal quality was clear b/t trials when she spoke. Oral phase appeared improved w/ bolus attention and management. Timely mastication and A-P transfer noted; less munching on the foods and more ready acceptance of trials. She tended to avoid the meats so Pinto beans were recommended for tomorrow to Son/Husband who agreed. No oral residue remained post trials. She did not readily feed herself but discussed w/ family this could be something she could continue to work on w/ OT.  Overall, pt appeared much improved w/ her oral awareness of task of po intake, eating/drinking. No overt s/s of aspiration noted w/ po intake at this meal. Family felt comfortable w/ her swallowing and know the next focus is on pt helping to feed herself. Recommend a Mech Soft diet w/ thin liquids for conservation of energy at this time; general aspiration precautions; Pills in Puree if needed for safer swallowing.  No further Dysphagia tx indicated; recommend f/u for Cognitive-linguistic if needs indicated. This would best be addressed in a more structured setting w/ routine and less  distractions. Family agreed. NSG updated.    HPI HPI: Pt is an 77 y.o. female. 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  Patient was admitted to the hospital with altered mental status and diagnosis of acute encephalopathy; lethargy.  She was noticed to become more lethargic and required ICU transfer because of requirement to titrate BiPAP.  She remains drowsy w/ min improved mentation per MD but appears to remain confused w/ Cognitive decline.  She often called out repeatedly for "iced tea, cold cold cold iced tea out of the refrigerator".  Pt did not make any attempt to feed self and drank impulsively when offered the cup.       SLP Plan  All goals met       Recommendations  Diet recommendations: Dysphagia 3 (mechanical soft);Thin liquid(for conservation of energy at this time) Liquids provided via: Cup;Straw Medication Administration: Whole meds with puree(as needed for safer swallowing while sick/in bed) Supervision: Staff to assist with self feeding;Intermittent supervision to cue for compensatory strategies(encourage pt to help feed self) Compensations: Minimize environmental distractions;Slow rate;Small sips/bites;Follow solids with liquid Postural Changes and/or Swallow Maneuvers: Seated upright 90 degrees;Upright 30-60 min after meal                General recommendations: (Dietician f/u - likes Ensure) Oral Care Recommendations: Oral care BID;Staff/trained caregiver to provide oral care Follow up Recommendations: None(for swallowing; assess Cognitive status) SLP Visit Diagnosis: Dysphagia, unspecified (R13.10) Plan: All goals met       GO  Orinda Kenner, MS, CCC-SLP Watson,Katherine 04/16/2018, 6:13 PM

## 2018-04-17 LAB — CREATININE, SERUM
Creatinine, Ser: 0.6 mg/dL (ref 0.44–1.00)
GFR calc Af Amer: >60 mL/min (ref >60)
GFR calc non Af Amer: >60 mL/min (ref >60)

## 2018-04-17 LAB — GLUCOSE, CAPILLARY
GLUCOSE-CAPILLARY: 184 mg/dL — AB (ref 70–99)
GLUCOSE-CAPILLARY: 267 mg/dL — AB (ref 70–99)

## 2018-04-17 MED ORDER — TORSEMIDE 20 MG PO TABS: 40.0000 mg | ORAL_TABLET | Freq: Two times a day (BID) | ORAL | Status: DC

## 2018-04-17 MED ORDER — OMEPRAZOLE 20 MG PO CPDR: 20.0000 mg | DELAYED_RELEASE_CAPSULE | Freq: Every day | ORAL | Status: DC

## 2018-04-17 NOTE — Care Management (Signed)
Patient to discharge to [facility].  CSW facilitating.  Joelene Millin from [facility] notified of disposition.

## 2018-04-17 NOTE — Progress Notes (Signed)
Inpatient Diabetes Program Recommendations  AACE/ADA: New Consensus Statement on Inpatient Glycemic Control (2019)  Target Ranges:  Prepandial:   less than 140 mg/dL      Peak postprandial:   less than 180 mg/dL (1-2 hours)      Critically ill patients:  140 - 180 mg/dL   Results for HAELEY, FORDHAM (MRN [id number]) as of 04/17/2018 08:48  Ref. Range 04/16/2018 07:55 04/16/2018 11:50 04/16/2018 16:41 04/16/2018 21:07 04/17/2018 07:30  Glucose-Capillary Latest Ref Range: 70 - 99 mg/dL 186 (H) 216 (H) 345 (H) 323 (H) 184 (H)   Review of Glycemic Control Diabetes history:DM2 Outpatient Diabetes medications:Lantus 20 units QAM, Lantus 28 units QHS, Novolog 0-15 units TID with meals Current orders for Inpatient glycemic control:Lantus 10 units QAM,Lantus 25 units QHS, Novolog 0-9 units TID with meals, Novolog 0-5 units QHS  Inpatient Diabetes Program Recommendations: Insulin-Meal Coverage: Post prandial glucose is consistently greater than 180 mg/dl, please consider ordering Novolog 5 units TID with meals for meal coverage if patient eats at lest 50% of meals.  Thanks, Barnie Alderman, RN, MSN, CDE Diabetes Coordinator Inpatient Diabetes Program [phone] (Team Pager from 8am to 5pm)

## 2018-04-17 NOTE — Clinical Social Work Placement (Signed)
   CLINICAL SOCIAL WORK PLACEMENT  NOTE  Date:  04/17/2018  Patient Details  Name: [name] MRN: [id number] Date of Birth: [birth year]  Clinical Social Work is seeking post-discharge placement for this patient at the [place] level of care (*CSW will initial, date and re-position this form in  chart as items are completed):  Yes   Patient/family provided with [place] Work Department's list of facilities offering this level of care within the geographic area requested by the patient (or if unable, by the patient's family).  Yes   Patient/family informed of their freedom to choose among providers that offer the needed level of care, that participate in Medicare, Medicaid or managed care program needed by the patient, have an available bed and are willing to accept the patient.  Yes   Patient/family informed of [facility]'s ownership interest in [facility] and [facility], as well as of the fact that they are under no obligation to receive care at these facilities.  PASRR submitted to EDS on       PASRR number received on       Existing PASRR number confirmed on 04/11/18     FL2 transmitted to all facilities in geographic area requested by pt/family on 04/11/18     FL2 transmitted to all facilities within larger geographic area on       Patient informed that his/her managed care company has contracts with or will negotiate with certain facilities, including the following:        Yes   Patient/family informed of bed offers received.  Patient chooses bed at [facility] )     Physician recommends and patient chooses bed at      Patient to be transferred to [facility] ) on 04/17/18.  Patient to be transferred to facility by [facility] )     Patient family notified on 04/17/18 of transfer.  Name of family member notified:  (Patient's husband Tylena Prisk is aware of D/C today. )     PHYSICIAN        Additional Comment:    _______________________________________________ Khaleed Holan, Veronia Beets, LCSW 04/17/2018, 2:21 PM

## 2018-04-17 NOTE — Progress Notes (Signed)
Patient is medically stable for D/C to [facility] today. [facility] authorization has been received, authorization # [id number]. Per [facility] admissions coordinator at [facility] patient can come today. RN will call report and arrange EMS for transport. Clinical Education officer, museum (CSW) sent D/C orders to [facility] via [place]. Patient is aware of above. CSW contacted patient's husband Latondra Gebhart and made him aware of above. Please reconsult if future social work needs arise. CSW signing off.   [employer], LCSW [phone]

## 2018-04-17 NOTE — Progress Notes (Signed)
Patient was eager to talk about her faith, family, and need for healing of her R toe and L leg. Chaplain offered compassion, emotional support, and energetic prayer. Patient asked for follow-up visits.

## 2018-04-17 NOTE — Progress Notes (Signed)
AVS printed. Called report to [facility], spoke to [place]. EMS called for non-emergent transport. IV removed. Awaiting pick up.

## 2018-04-17 NOTE — Discharge Summary (Signed)
[facility] NAME: [name]    MR#:  [id number]  DATE OF BIRTH:  25-Mar-1941  DATE OF ADMISSION:  04/10/2018 ADMITTING PHYSICIAN: Loletha Grayer, MD  DATE OF DISCHARGE: 04/17/2018  PRIMARY CARE PHYSICIAN: [name], MD    ADMISSION DIAGNOSIS:  Lower urinary tract infectious disease [N39.0] Wound, open [T14.8XXA] Acute metabolic encephalopathy [O16.07]  DISCHARGE DIAGNOSIS:  Active Problems:   Acute cystitis   SECONDARY DIAGNOSIS:   Past Medical History:  Diagnosis Date  . Acute heart failure ([place])   . Aortic stenosis   . Complication of anesthesia    Hard to wake patient up after having anesthesia  . Diabetes mellitus without complication ([place])   . Diabetic neuropathy ([facility])    Takes Lyrica  . Edema of both legs    Takes Lasix  . GERD (gastroesophageal reflux disease)   . High cholesterol   . HTN (hypertension)   . Hypertension   . PAD (peripheral artery disease) ([place])   . Shortness of breath dyspnea   . Spasm of back muscles   . Stroke ([place])   . Wound, open    Right great toe    HOSPITAL COURSE:   77 year old female with past medical history of COPD, history of previous CVA, peripheral artery disease, hypertension, hyperlipidemia, GERD, diabetes, diabetic neuropathy, history of aortic stenosis who presents to the hospital due to altered mental status.  1.  Altered mental status-  this was metabolic encephalopathy secondary to hypercapnic respiratory failure, UTI.  Patient CT head on admission was negative.  Patient was treated for the hyper Respiratory failure with BiPAP and also given IV antibiotics for UTI.  Mental status has significantly improved and is currently back to baseline.  2.  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 - No evidence of acute COPD exacerbation.  No wheezing or bronchospasm.   -Patient was placed on BiPAP and hypercapnia has improved.   Patient's mental status is also improved.  She is now being discharged to short-term rehab.  3.  Urinary tract infection-based off urinalysis and urine culture.  Urine culture grew out 50,000 colonies of E. coli. - she has finished treatment with IV Ceftriaxone.   4.  Essential hypertension- while in the hospital patient received some as needed IV hydralazine and labetalol.  Her blood pressure has improved.  She will resume her carvedilol, Imdur, torsemide.   5.  Diabetes type 2 without complication- his blood sugars were somewhat labile.  Diabetes coordinator was consulted, patient's Lantus dose was adjusted.  She is being discharged on the Lantus at 27 units and also NovoLog sliding scale.  Further changes to her diabetic regimen can be done as an outpatient.  6.  Hx of previous CVA- she will continue aspirin, Plavix, atorvastatin.  7.  Hyperlipidemia-continue atorvastatin.  8.  Chronic diastolic CHF- patient will resume her torsemide at a lower dose along with her Imdur, carvedilol.  9.  Neuropathy-patient will continue her Lyrica.  10.  GERD-patient will continue omeprazole.  DISCHARGE CONDITIONS:   Stable  CONSULTS OBTAINED:  Treatment Team:  Henreitta Leber, MD  DRUG ALLERGIES:   Allergies  Allergen Reactions  . Iodine Anaphylaxis  . Penicillins Anaphylaxis    Tolerates ceftriaxone, cefazolin Has patient had a PCN reaction causing immediate rash, facial/tongue/throat swelling, SOB or lightheadedness with hypotension: Unknown Has patient had a PCN reaction causing severe rash involving mucus membranes or skin necrosis: Unknown Has patient had  a PCN reaction that required hospitalization: Unknown Has patient had a PCN reaction occurring within the last 10 years: Unknown If all of the above answers are "NO", then may proceed with Cephalosporin u  . Shellfish Allergy Anaphylaxis  . Shellfish-Derived Products Anaphylaxis  . Sulfa Antibiotics Anaphylaxis  .  Sulfacetamide Sodium Anaphylaxis  . Sulfasalazine Anaphylaxis  . Atorvastatin     Other reaction(s): Unknown  . Morphine And Related Other (See Comments)    Altered mental status    DISCHARGE MEDICATIONS:   Allergies as of 04/17/2018      Reactions   Iodine Anaphylaxis   Penicillins Anaphylaxis   Tolerates ceftriaxone, cefazolin Has patient had a PCN reaction causing immediate rash, facial/tongue/throat swelling, SOB or lightheadedness with hypotension: Unknown Has patient had a PCN reaction causing severe rash involving mucus membranes or skin necrosis: Unknown Has patient had a PCN reaction that required hospitalization: Unknown Has patient had a PCN reaction occurring within the last 10 years: Unknown If all of the above answers are "NO", then may proceed with Cephalosporin u   Shellfish Allergy Anaphylaxis   Shellfish-derived Products Anaphylaxis   Sulfa Antibiotics Anaphylaxis   Sulfacetamide Sodium Anaphylaxis   Sulfasalazine Anaphylaxis   Atorvastatin    Other reaction(s): Unknown   Morphine And Related Other (See Comments)   Altered mental status      Medication List    STOP taking these medications   amLODipine 10 MG tablet Commonly known as:  NORVASC   furosemide 40 MG tablet Commonly known as:  LASIX     TAKE these medications   acetaminophen 500 MG tablet Commonly known as:  TYLENOL Take 1,000 mg by mouth every 6 (six) hours as needed for mild pain.   albuterol 108 (90 Base) MCG/ACT inhaler Commonly known as:  PROVENTIL HFA;VENTOLIN HFA Inhale 2 puffs into the lungs every 6 (six) hours as needed.   aspirin 325 MG tablet Take 1 tablet (325 mg total) by mouth daily with breakfast.   atorvastatin 20 MG tablet Commonly known as:  LIPITOR Take 1 tablet (20 mg total) by mouth daily.   carvedilol 6.25 MG tablet Commonly known as:  COREG Take 6.25 mg by mouth 2 (two) times daily with a meal.   cetirizine 10 MG tablet Commonly known as:  ZYRTEC Take 10  mg by mouth daily.   cholecalciferol 1000 units tablet Commonly known as:  VITAMIN D Take 2,000 Units by mouth daily.   clopidogrel 75 MG tablet Commonly known as:  PLAVIX Take 1 tablet (75 mg total) by mouth daily with breakfast.   docusate sodium 100 MG capsule Commonly known as:  COLACE Take 100 mg by mouth 2 (two) times daily.   fluticasone 50 MCG/ACT nasal spray Commonly known as:  FLONASE Place 1 spray into both nostrils daily.   insulin aspart 100 UNIT/ML injection Commonly known as:  novoLOG Inject 0-15 Units into the skin 3 (three) times daily with meals. Before each meal 3 times a day, 140-199 - 3 units, 200-250 - 5 units, 251-299 - 8 units,  300-349 - 12 units,  350 or above 15 units. What changed:  additional instructions   insulin glargine 100 UNIT/ML injection Commonly known as:  LANTUS Inject 0.27 mLs (27 Units total) into the skin at bedtime. What changed:    how much to take  when to take this  additional instructions   isosorbide dinitrate 30 MG tablet Commonly known as:  ISORDIL Take 30 mg by mouth daily.  nitroGLYCERIN 0.4 MG SL tablet Commonly known as:  NITROSTAT Place 1 tablet (0.4 mg total) under the tongue every 5 (five) minutes as needed for chest pain.   omeprazole 20 MG capsule Commonly known as:  PRILOSEC Take 1 capsule (20 mg total) by mouth daily.   polyethylene glycol packet Commonly known as:  MIRALAX / GLYCOLAX Take 17 g by mouth 2 (two) times daily. What changed:    when to take this  reasons to take this   potassium chloride SA 20 MEQ tablet Commonly known as:  K-DUR,KLOR-CON Take 20 mEq by mouth daily.   pregabalin 50 MG capsule Commonly known as:  LYRICA Take 50 mg by mouth 3 (three) times daily. What changed:  Another medication with the same name was removed. Continue taking this medication, and follow the directions you see here.   torsemide 20 MG tablet Commonly known as:  DEMADEX Take 2 tablets (40 mg total)  by mouth 2 (two) times daily. What changed:  when to take this   VITAMIN E PO Take 1 capsule by mouth daily.         DISCHARGE INSTRUCTIONS:   DIET:  Cardiac diet and Diabetic diet  DISCHARGE CONDITION:  Stable  ACTIVITY:  Activity as tolerated  OXYGEN:  Home Oxygen: No.   Oxygen Delivery: room air  DISCHARGE LOCATION:  nursing home   If you experience worsening of your admission symptoms, develop shortness of breath, life threatening emergency, suicidal or homicidal thoughts you must seek medical attention immediately by calling 911 or calling your MD immediately  if symptoms less severe.  You Must read complete instructions/literature along with all the possible adverse reactions/side effects for all the Medicines you take and that have been prescribed to you. Take any new Medicines after you have completely understood and accpet all the possible adverse reactions/side effects.   Please note  You were cared for by a hospitalist during your hospital stay. If you have any questions about your discharge medications or the care you received while you were in the hospital after you are discharged, you can call the unit and asked to speak with the hospitalist on call if the hospitalist that took care of you is not available. Once you are discharged, your primary care physician will handle any further medical issues. Please note that NO REFILLS for any discharge medications will be authorized once you are discharged, as it is imperative that you return to your primary care physician (or establish a relationship with a primary care physician if you do not have one) for your aftercare needs so that they can reassess your need for medications and monitor your lab values.     Today   No acute events overnight.  Mental status much improved.  Patient's family is at bedside.  Patient tolerating p.o. well.  No further worsening shortness of breath or any other associated symptoms.  Will  discharge to short-term rehab today.  VITAL SIGNS:  Blood pressure (!) 153/61, pulse 89, temperature 98.6 F (37 C), temperature source Oral, resp. rate 17, height 5\' 5"  (1.651 m), weight 106.1 kg, SpO2 98 %.  I/O:    Intake/Output Summary (Last 24 hours) at 04/17/2018 1318 Last data filed at 04/17/2018 0453 Gross per 24 hour  Intake 720 ml  Output 1550 ml  Net -830 ml    PHYSICAL EXAMINATION:   GENERAL:  77 y.o.-year-old obese patient lying in bed with some tremor and confused.  EYES: Pupils equal, round, reactive to  light and accommodation. No scleral icterus. Extraocular muscles intact.  HEENT: Head atraumatic, normocephalic. Oropharynx and nasopharynx clear.  NECK:  Supple, no jugular venous distention. No thyroid enlargement, no tenderness.  LUNGS: Normal breath sounds bilaterally, no wheezing, rales, rhonchi. No use of accessory muscles of respiration.  CARDIOVASCULAR: S1, S2 normal. No murmurs, rubs, or gallops.  ABDOMEN: Soft, nontender, nondistended. Bowel sounds present. No organomegaly or mass.  EXTREMITIES: No cyanosis, clubbing, + 1 edema b/l.   NEUROLOGIC: Cranial nerves II through XII are intact. No focal Motor or sensory deficits b/l.  Globally weak PSYCHIATRIC: The patient is alert and oriented x 1.  SKIN: No obvious rash, lesion, or ulcer.   DATA REVIEW:   CBC Recent Labs  Lab 04/14/18 0424  WBC 10.1  HGB 13.2  HCT 40.7  PLT 134*    Chemistries  Recent Labs  Lab 04/13/18 0553  04/16/18 0350 04/17/18 0500  NA 141   < > 143  --   K 5.0   < > 3.3*  --   CL 100   < > 97*  --   CO2 35*   < > 40*  --   GLUCOSE 241*   < > 215*  --   BUN 12   < > 15  --   CREATININE 0.71   < > 0.55 0.60  CALCIUM 9.2   < > 9.1  --   MG 2.1  --   --   --   AST 16  --   --   --   ALT 22  --   --   --   ALKPHOS 59  --   --   --   BILITOT 0.8  --   --   --    < > = values in this interval not displayed.    Cardiac Enzymes No results for input(s): TROPONINI in the  last 168 hours.  Microbiology Results  Results for orders placed or performed during the hospital encounter of 04/10/18  Urine Culture     Status: Abnormal   Collection Time: 04/10/18 12:41 PM  Result Value Ref Range Status   Specimen Description   Final    URINE, RANDOM Performed at [facility], [address] [place], [place] [address]    Special Requests   Final    NONE Performed at [facility], [place], [place] [address]    Culture 50,000 COLONIES/mL ESCHERICHIA COLI (A)  Final   Report Status 04/13/2018 FINAL  Final   Organism ID, Bacteria ESCHERICHIA COLI (A)  Final      Susceptibility   Escherichia coli - MIC*    AMPICILLIN <=2 SENSITIVE Sensitive     CEFAZOLIN <=4 SENSITIVE Sensitive     CEFTRIAXONE <=1 SENSITIVE Sensitive     CIPROFLOXACIN <=0.25 SENSITIVE Sensitive     GENTAMICIN <=1 SENSITIVE Sensitive     IMIPENEM 1 SENSITIVE Sensitive     NITROFURANTOIN 32 SENSITIVE Sensitive     TRIMETH/SULFA <=20 SENSITIVE Sensitive     AMPICILLIN/SULBACTAM <=2 SENSITIVE Sensitive     PIP/TAZO <=4 SENSITIVE Sensitive     Extended ESBL NEGATIVE Sensitive     * 50,000 COLONIES/mL ESCHERICHIA COLI  CULTURE, BLOOD (ROUTINE X 2) w Reflex to ID Panel     Status: None   Collection Time: 04/10/18  5:18 PM  Result Value Ref Range Status   Specimen Description BLOOD L HAND  Final   Special Requests   Final  BOTTLES DRAWN AEROBIC AND ANAEROBIC Blood Culture results may not be optimal due to an inadequate volume of blood received in culture bottles   Culture   Final    NO GROWTH 5 DAYS Performed at [facility], [address] [place], [place] [address]    Report Status 04/15/2018 FINAL  Final  CULTURE, BLOOD (ROUTINE X 2) w Reflex to ID Panel     Status: None   Collection Time: 04/10/18  6:11 PM  Result Value Ref Range Status   Specimen Description BLOOD BLOOD LEFT HAND  Final   Special Requests   Final    BOTTLES DRAWN AEROBIC  ONLY Blood Culture results may not be optimal due to an inadequate volume of blood received in culture bottles   Culture   Final    NO GROWTH 5 DAYS Performed at [facility], [address] [place], [place] [address]    Report Status 04/15/2018 FINAL  Final  MRSA PCR Screening     Status: None   Collection Time: 04/14/18  9:40 AM  Result Value Ref Range Status   MRSA by PCR NEGATIVE NEGATIVE Final    Comment:        The GeneXpert MRSA Assay (FDA approved for NASAL specimens only), is one component of a comprehensive MRSA colonization surveillance program. It is not intended to diagnose MRSA infection nor to guide or monitor treatment for MRSA infections. Performed at [facility], [address] [place],  [address]     RADIOLOGY:  No results found.    Management plans discussed with the patient, family and they are in agreement.  CODE STATUS:     Code Status Orders  (From admission, onward)         Start     Ordered   04/10/18 1437  Full code  Continuous     04/10/18 1437          TOTAL TIME TAKING CARE OF THIS PATIENT: 40 minutes.    Henreitta Leber M.D on 04/17/2018 at 1:18 PM  Between 7am to 6pm - Pager - [phone]  After 6pm go to [email or web address] - [employer] [facility]  Office  [phone]  CC: Primary care physician; [name], MD

## 2018-04-27 ENCOUNTER — Encounter: Payer: Self-pay | Admitting: Family

## 2018-04-27 ENCOUNTER — Ambulatory Visit: Payer: Medicare HMO | Attending: Family | Admitting: Family

## 2018-04-27 VITALS — BP 120/82 | HR 86 | Resp 18 | Ht 64.0 in | Wt 202.0 lb

## 2018-04-27 DIAGNOSIS — I5032 Chronic diastolic (congestive) heart failure: Secondary | ICD-10-CM | POA: Diagnosis not present

## 2018-04-27 DIAGNOSIS — Z79899 Other long term (current) drug therapy: Secondary | ICD-10-CM | POA: Insufficient documentation

## 2018-04-27 DIAGNOSIS — Z7951 Long term (current) use of inhaled steroids: Secondary | ICD-10-CM | POA: Insufficient documentation

## 2018-04-27 DIAGNOSIS — Z833 Family history of diabetes mellitus: Secondary | ICD-10-CM | POA: Insufficient documentation

## 2018-04-27 DIAGNOSIS — I110 Hypertensive heart disease with heart failure: Secondary | ICD-10-CM | POA: Insufficient documentation

## 2018-04-27 DIAGNOSIS — Z888 Allergy status to other drugs, medicaments and biological substances status: Secondary | ICD-10-CM | POA: Diagnosis not present

## 2018-04-27 DIAGNOSIS — Z7982 Long term (current) use of aspirin: Secondary | ICD-10-CM | POA: Insufficient documentation

## 2018-04-27 DIAGNOSIS — Z880 Allergy status to penicillin: Secondary | ICD-10-CM | POA: Diagnosis not present

## 2018-04-27 DIAGNOSIS — Z823 Family history of stroke: Secondary | ICD-10-CM | POA: Diagnosis not present

## 2018-04-27 DIAGNOSIS — E7800 Pure hypercholesterolemia, unspecified: Secondary | ICD-10-CM | POA: Insufficient documentation

## 2018-04-27 DIAGNOSIS — Z8673 Personal history of transient ischemic attack (TIA), and cerebral infarction without residual deficits: Secondary | ICD-10-CM | POA: Diagnosis not present

## 2018-04-27 DIAGNOSIS — K219 Gastro-esophageal reflux disease without esophagitis: Secondary | ICD-10-CM | POA: Insufficient documentation

## 2018-04-27 DIAGNOSIS — Z794 Long term (current) use of insulin: Secondary | ICD-10-CM | POA: Insufficient documentation

## 2018-04-27 DIAGNOSIS — Z8249 Family history of ischemic heart disease and other diseases of the circulatory system: Secondary | ICD-10-CM | POA: Insufficient documentation

## 2018-04-27 DIAGNOSIS — Z885 Allergy status to narcotic agent status: Secondary | ICD-10-CM | POA: Insufficient documentation

## 2018-04-27 DIAGNOSIS — Z882 Allergy status to sulfonamides status: Secondary | ICD-10-CM | POA: Diagnosis not present

## 2018-04-27 DIAGNOSIS — E1151 Type 2 diabetes mellitus with diabetic peripheral angiopathy without gangrene: Secondary | ICD-10-CM | POA: Insufficient documentation

## 2018-04-27 DIAGNOSIS — Z7902 Long term (current) use of antithrombotics/antiplatelets: Secondary | ICD-10-CM | POA: Insufficient documentation

## 2018-04-27 DIAGNOSIS — E1159 Type 2 diabetes mellitus with other circulatory complications: Secondary | ICD-10-CM

## 2018-04-27 DIAGNOSIS — Z8379 Family history of other diseases of the digestive system: Secondary | ICD-10-CM | POA: Diagnosis not present

## 2018-04-27 DIAGNOSIS — E1140 Type 2 diabetes mellitus with diabetic neuropathy, unspecified: Secondary | ICD-10-CM | POA: Insufficient documentation

## 2018-04-27 DIAGNOSIS — Z91013 Allergy to seafood: Secondary | ICD-10-CM | POA: Insufficient documentation

## 2018-04-27 DIAGNOSIS — I10 Essential (primary) hypertension: Secondary | ICD-10-CM

## 2018-04-27 DIAGNOSIS — I509 Heart failure, unspecified: Secondary | ICD-10-CM | POA: Diagnosis present

## 2018-04-27 NOTE — Patient Instructions (Addendum)
Begin weighing daily and call for an overnight weight gain of > 2 pounds or a weekly weight gain of >5 pounds. 

## 2018-04-27 NOTE — Progress Notes (Signed)
Patient ID: [name], female    DOB: [birth year], 77 y.o.   MRN: [id number]  HPI  [name] is a 77 y/o female with a history of DM, hyperlipidemia, HTN, stroke, aortic stenosis, GERD, PAD and chronic heart failure.   Echo report from 04/10/18 reviewed and showed an EF of 55-60% along with moderate AS.   Admitted 04/10/18 due to altered mental status thought to be due to metabolic encephalopathy secondary to hypercapnic respiratory failure and UTI. Initially needed bipap and IV antibiotics. Received IV hydralazine and labetalol due to HTN. Discharged after 7 days to SNF.   She presents today for her initial visit with a chief complaint of moderate fatigue upon minimal exertion. She describes this as being present for several months. She has associated shortness of breath, pedal edema, feet pain and weakness along with this. She denies any difficulty sleeping, abdominal distention, palpitations, chest pain or dizziness. Is not able to stand on the scale and her husband says that prior to recent admission, she could walk around with her walker.   Past Medical History:  Diagnosis Date  . Acute heart failure ([place])   . Aortic stenosis   . CHF (congestive heart failure) ([place])   . Complication of anesthesia    Hard to wake patient up after having anesthesia  . Diabetes mellitus without complication ([place])   . Diabetic neuropathy ([facility])    Takes Lyrica  . Edema of both legs    Takes Lasix  . GERD (gastroesophageal reflux disease)   . High cholesterol   . HTN (hypertension)   . Hypertension   . PAD (peripheral artery disease) ([place])   . Shortness of breath dyspnea   . Spasm of back muscles   . Stroke ([place])   . Wound, open    Right great toe   Past Surgical History:  Procedure Laterality Date  . BYPASS GRAFT POPLITEAL TO TIBIAL Right 02/28/2016   Procedure: BYPASS GRAFT RIGHT BELOW KNEE POPLITEAL TO PERONEAL USING REVERSED RIGHT GREATER SAPHENOUS VEIN;  Surgeon: Conrad Coffey, MD;  Location: [facility];  Service: Vascular;  Laterality: Right;  . CYST EXCISION     Abdomen  . EYE SURGERY Bilateral    Cataract removal  . INTRAMEDULLARY (IM) NAIL INTERTROCHANTERIC Left 02/04/2014   Procedure: INTRAMEDULLARY (IM) NAIL INTERTROCHANTRIC FEMORAL;  Surgeon: Mauri Pole, MD;  Location: [facility];  Service: Orthopedics;  Laterality: Left;  . IR GENERIC HISTORICAL  03/01/2016   IR ANGIO INTRA EXTRACRAN SEL COM CAROTID INNOMINATE UNI R MOD SED 03/01/2016 [name], MD [facility]  . IR GENERIC HISTORICAL  03/01/2016   IR ENDOVASC INTRACRANIAL INF OTHER THAN THROMBO ART INC DIAG ANGIO 03/01/2016 [name], MD [facility]  . IR GENERIC HISTORICAL  03/01/2016   IR INTRAVSC STENT CERV CAROTID W/O EMB-PROT MOD SED INC ANGIO 03/01/2016 [name], MD [facility]  . IR GENERIC HISTORICAL  06/03/2016   IR RADIOLOGIST EVAL & MGMT 06/03/2016 [facility]  . ORIF TOE FRACTURE Right 02/08/2014   Procedure: OPEN REDUCTION INTERNAL FIXATION Right METATARSAL  FRACTURE ;  Surgeon: Wylene Simmer, MD;  Location: [facility];  Service: Orthopedics;  Laterality: Right;  . PERIPHERAL VASCULAR CATHETERIZATION N/A 12/28/2015   Procedure: Abdominal Aortogram w/Lower Extremity;  Surgeon: Conrad [place], MD;  Location: [facility];  Service: Cardiovascular;  Laterality: N/A;  . RADIOLOGY WITH ANESTHESIA N/A 03/01/2016   Procedure: RADIOLOGY WITH ANESTHESIA;  Surgeon: [name], MD;  Location: [facility];  Service: Radiology;  Laterality: N/A;  . VEIN HARVEST Right 02/28/2016   Procedure: RIGHT GREATER SAPHENOUS VEIN HARVEST;  Surgeon: Conrad [place], MD;  Location: [facility];  Service: Vascular;  Laterality: Right;   Family History  Problem Relation Age of Onset  . Diabetes Other   . Liver disease Mother   . CVA Father   . Diabetes Father   . Hypertension Father    Social History   Tobacco Use  . Smoking status: Never Smoker  . Smokeless tobacco: Never Used  . Tobacco comment: Never smoked  Substance  Use Topics  . Alcohol use: No    Alcohol/week: 0.0 standard drinks   Allergies  Allergen Reactions  . Iodine Anaphylaxis  . Penicillins Anaphylaxis    Tolerates ceftriaxone, cefazolin Has patient had a PCN reaction causing immediate rash, facial/tongue/throat swelling, SOB or lightheadedness with hypotension: Unknown Has patient had a PCN reaction causing severe rash involving mucus membranes or skin necrosis: Unknown Has patient had a PCN reaction that required hospitalization: Unknown Has patient had a PCN reaction occurring within the last 10 years: Unknown If all of the above answers are "NO", then may proceed with Cephalosporin u  . Shellfish Allergy Anaphylaxis  . Shellfish-Derived Products Anaphylaxis  . Sulfa Antibiotics Anaphylaxis  . Sulfacetamide Sodium Anaphylaxis  . Sulfasalazine Anaphylaxis  . Atorvastatin     Other reaction(s): Unknown  . Morphine And Related Other (See Comments)    Altered mental status   Prior to Admission medications   Medication Sig Start Date End Date Taking? Authorizing Provider  acetaminophen (TYLENOL) 500 MG tablet Take 1,000 mg by mouth every 6 (six) hours as needed for mild pain.   Yes [provider]  albuterol (PROVENTIL HFA;VENTOLIN HFA) 108 (90 Base) MCG/ACT inhaler Inhale 2 puffs into the lungs every 6 (six) hours as needed.   Yes [provider]  aspirin 325 MG tablet Take 1 tablet (325 mg total) by mouth daily with breakfast. 07/10/16  Yes Holley Raring, MD  atorvastatin (LIPITOR) 20 MG tablet Take 1 tablet (20 mg total) by mouth daily. 03/13/16  Yes Alvia Grove, PA-C  carvedilol (COREG) 6.25 MG tablet Take 6.25 mg by mouth 2 (two) times daily with a meal.  02/06/17  Yes [provider]  cetirizine (ZYRTEC) 10 MG tablet Take 10 mg by mouth daily.    Yes [provider]  cholecalciferol (VITAMIN D) 1000 units tablet Take 2,000 Units by mouth daily.    Yes [provider]  clopidogrel  (PLAVIX) 75 MG tablet Take 1 tablet (75 mg total) by mouth daily with breakfast. 03/04/17  Yes Conrad Hammon, MD  docusate sodium (COLACE) 100 MG capsule Take 100 mg by mouth 2 (two) times daily.   Yes [provider]  fluticasone (FLONASE) 50 MCG/ACT nasal spray Place 1 spray into both nostrils daily.   Yes [provider]  insulin aspart (NOVOLOG) 100 UNIT/ML injection Inject 0-15 Units into the skin 3 (three) times daily with meals. Before each meal 3 times a day, 140-199 - 3 units, 200-250 - 5 units, 251-299 - 8 units,  300-349 - 12 units,  350 or above 15 units. Patient taking differently: Inject 10 Units into the skin 3 (three) times daily with meals.  02/09/14  Yes Thurnell Lose, MD  insulin glargine (LANTUS) 100 UNIT/ML injection Inject 0.27 mLs (27 Units total) into the skin at bedtime. Patient taking differently: Inject 32 Units into the skin at  bedtime.  09/16/16  Yes Reyne Dumas, MD  isosorbide dinitrate (ISORDIL) 30 MG tablet Take 30 mg by mouth daily.  04/16/17  Yes [provider]  nitroGLYCERIN (NITROSTAT) 0.4 MG SL tablet Place 1 tablet (0.4 mg total) under the tongue every 5 (five) minutes as needed for chest pain. 07/09/16  Yes Holley Raring, MD  omeprazole (PRILOSEC) 20 MG capsule Take 1 capsule (20 mg total) by mouth daily. 04/17/18  Yes Sainani, Belia Heman, MD  polyethylene glycol (MIRALAX / GLYCOLAX) packet Take 17 g by mouth 2 (two) times daily. Patient taking differently: Take 17 g by mouth daily as needed for mild constipation.  02/09/14  Yes Thurnell Lose, MD  potassium chloride SA (K-DUR,KLOR-CON) 20 MEQ tablet Take 20 mEq by mouth daily.   Yes [provider]  pregabalin (LYRICA) 50 MG capsule Take 50 mg by mouth 3 (three) times daily.    Yes [provider]  torsemide (DEMADEX) 20 MG tablet Take 2 tablets (40 mg total) by mouth 2 (two) times daily. 04/17/18  Yes Sainani, Belia Heman, MD  VITAMIN E PO Take 1 capsule by mouth daily.    Yes [provider]    Review of Systems  Constitutional: Positive for fatigue. Negative for appetite change.  HENT: Positive for hearing loss. Negative for congestion.   Eyes: Negative.   Respiratory: Positive for shortness of breath. Negative for chest tightness.   Cardiovascular: Positive for leg swelling. Negative for chest pain and palpitations.  Gastrointestinal: Negative for abdominal distention and abdominal pain.  Endocrine: Negative.   Genitourinary: Negative.   Musculoskeletal: Positive for arthralgias (feet pain). Negative for neck pain.  Skin: Positive for wound (right great toe wound (seeing wound center)).  Allergic/Immunologic: Negative.   Neurological: Positive for weakness. Negative for dizziness and light-headedness.  Hematological: Negative for adenopathy. Does not bruise/bleed easily.  Psychiatric/Behavioral: Negative for sleep disturbance. The patient is not nervous/anxious.    Vitals:   04/27/18 1018 04/27/18 1052  BP: (!) 129/112 120/82  Pulse: 86   Resp: 18   SpO2: 91%   Weight: 202 lb (91.6 kg)   Height: 5\' 4"  (1.626 m)    Wt Readings from Last 3 Encounters:  04/27/18 202 lb (91.6 kg)  04/12/18 233 lb 14.5 oz (106.1 kg)  12/12/17 223 lb (101.2 kg)   Lab Results  Component Value Date   CREATININE 0.60 04/17/2018   CREATININE 0.55 04/16/2018   CREATININE 0.57 04/14/2018    Physical Exam  Constitutional: She is oriented to person, place, and time. She appears well-developed and well-nourished.  HENT:  Head: Normocephalic and atraumatic.  Randomly grinds her teeth  Neck: Normal range of motion. Neck supple. No JVD present.  Cardiovascular: Normal rate and regular rhythm.  Pulmonary/Chest: Effort normal. No respiratory distress. She has no wheezes. She has no rales.  Abdominal: Soft. She exhibits no distension.  Musculoskeletal:       Right lower leg: She exhibits edema (2+ pitting lower leg).       Left lower leg: She exhibits edema  (2+ pitting lower leg).  Neurological: She is alert and oriented to person, place, and time.  Skin: Skin is warm and dry.  Psychiatric: She has a normal mood and affect. Her behavior is normal.  Nursing note and vitals reviewed.  Assessment & Plan:  1: Chronic heart failure with preserved ejection fraction- - NYHA class III - mildly fluid overloaded today - not being weighed and an order was written for her  to be weighed daily and to call for an overnight weight gain of >2 pounds or a weekly weight gain of >5 pounds - not adding salt to her food; currently living in a facility - order written for TED hose to be applied to left leg as the right leg has a wound on the great toe (per husband). Remove the TED hose at bedtime.  - elevate legs when sitting or lying down for long periods - had PT working with her but husband isn't sure that PT is continuing as he feels like his wife tells PT that she can't do the exercises. He says that he's going to talk with them so that PT can continue with the goal being to get her back home.  - wearing oxygen @ 3L around the clock - saw cardiology Nehemiah Massed) 05/07/17 - BNP 04/13/18 was 322.0 - PharmD reconciled medications  2: HTN- - BP initially elevated at 129/112 but was rechecked with manual cuff at 120/82 - sees PCP at the facility - [facility] 04/16/18 reviewed and showed sodium 142, potassium 3.3, creatinine 0.55 and GFR >60  3: Diabetes-  - glucose at the facility today was 198 - saw wound center 03/25/18 - A1c 04/11/18 was 8.8%  Facility medication list was reviewed.  Return here in 1 month or sooner for any questions/problems before then.

## 2018-04-28 ENCOUNTER — Encounter: Payer: Self-pay | Admitting: Family

## 2018-04-29 ENCOUNTER — Encounter: Payer: Medicare HMO | Attending: Internal Medicine | Admitting: Internal Medicine

## 2018-04-29 DIAGNOSIS — J449 Chronic obstructive pulmonary disease, unspecified: Secondary | ICD-10-CM | POA: Insufficient documentation

## 2018-04-29 DIAGNOSIS — I5032 Chronic diastolic (congestive) heart failure: Secondary | ICD-10-CM | POA: Diagnosis not present

## 2018-04-29 DIAGNOSIS — I70232 Atherosclerosis of native arteries of right leg with ulceration of calf: Secondary | ICD-10-CM | POA: Insufficient documentation

## 2018-04-29 DIAGNOSIS — E1152 Type 2 diabetes mellitus with diabetic peripheral angiopathy with gangrene: Secondary | ICD-10-CM | POA: Diagnosis not present

## 2018-04-29 DIAGNOSIS — Z794 Long term (current) use of insulin: Secondary | ICD-10-CM | POA: Insufficient documentation

## 2018-04-29 DIAGNOSIS — E11621 Type 2 diabetes mellitus with foot ulcer: Secondary | ICD-10-CM | POA: Diagnosis present

## 2018-04-29 DIAGNOSIS — L97219 Non-pressure chronic ulcer of right calf with unspecified severity: Secondary | ICD-10-CM | POA: Insufficient documentation

## 2018-04-29 DIAGNOSIS — E1151 Type 2 diabetes mellitus with diabetic peripheral angiopathy without gangrene: Secondary | ICD-10-CM | POA: Insufficient documentation

## 2018-04-29 DIAGNOSIS — Z9981 Dependence on supplemental oxygen: Secondary | ICD-10-CM | POA: Diagnosis not present

## 2018-04-29 DIAGNOSIS — I890 Lymphedema, not elsewhere classified: Secondary | ICD-10-CM | POA: Diagnosis not present

## 2018-04-29 DIAGNOSIS — I110 Hypertensive heart disease with heart failure: Secondary | ICD-10-CM | POA: Insufficient documentation

## 2018-04-29 DIAGNOSIS — L97519 Non-pressure chronic ulcer of other part of right foot with unspecified severity: Secondary | ICD-10-CM | POA: Insufficient documentation

## 2018-05-01 NOTE — Progress Notes (Signed)
[name], White ([id number]) Visit Report for 04/29/2018 HPI Details Patient Name: [name], [name] Date of Service: 04/29/2018 11:00 AM Medical Record Number: [id number] Patient Account Number: [id number] Date of Birth/Sex: [birth year] (77 y.o. F) Treating RN: Cornell Barman Primary Care Provider: Beverlyn Roux Other Clinician: Referring Provider: Beverlyn Roux Treating Provider/Extender: Tito Dine in Treatment: 40 History of Present Illness HPI Description: 08/13/16: This is a 77 year old woman who came predominantly for review of 3 cm in diameter circular wound to the left anterior lateral leg. She was in the ER on 08/01/16 I reviewed their notes. There was apparently pus coming out of the wound at that time and the patient arrived requesting debridement which they don't do in the emergency room. Nevertheless I can't see that they did any x-rays. There were no cultures done. She is a type II diabetic and I a note after the patient was in the clinic that she had a bypass graft from the popliteal to the tibial on the right on 02/28/16. She also had a right greater saphenous vein harvest on the same date for arterial bypass. She is going to have vascular studies including ABIs T ABIs on the right on 08/28/16. The patient's surgery was on 02/28/16 by Dr. Vallarie Mare she had a right below the knee popliteal artery to peroneal artery bypass with reverse greater saphenous vein and an endarterectomy of the mid segment peroneal artery. Postoperatively she had a strong mild monophasic peroneal signal with a pink foot. It would appear that the patient is had some nonhealing in the surgical saphenous vein harvest site on the left leg. Surprisingly looking through [facility] I cannot see much information about this at all. Dr. Lucious Groves notes from 05/29/16 show that the patient's wounds "are not healed" the right first metatarsal wound healed but then opened back up. 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 At that point the patient had a wound VAC to her right calf with regards to the wounds on her dorsal right toe would appear that these are felt to be arterial wounds. She has had surgery on the metatarsal phalangeal in 2015 I Dr. Doran Durand secondary to a right metatarsal phalangeal joint fracture. She is apparently had discoloration around this area since then. 08/28/16; patient arrives with her wounds in much the same condition. The linear vein harvest site and the circular wound below it which I think was a blister. She also has to probing holes in her right great toe and a necrotic eschar on the right second toe. Because of these being arterial wounds I reduced her compression from 3-2 layers this seems to of done satisfactorily she has not had any problems. I cannot see that she is actually had an x-ray ====== 11/06/16 the patient comes in for evaluation of her right lower extremity ulcers. She was here in January 2018 for 2 visits subsequently ended up in the hospital with pneumonia and then to rehabilitation. She has now been discharged from rehabilitation and is home. She has multiple ulcerations to her right lower extremity including the foot and toes. She does have home health in place and they have been placing alginate to the ulcers. She is followed by Dr. Bridgett Larsson of vascular medicine. 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 She recently saw him on 3/23. In office ABIs were:  Right 0.48 with monophasic flow to the DP, PT, peroneal Left 0.63 with monophasic flow to the DP and PT Her arterial studies indicated a patent right below knee popliteal to peroneal bypass She had an MRI in February 2008 that was negative frosty myelitis but this showed general soft tissue edema in the right foot and lower extremity concerning  for cellulitis She is a diabetic, managed with insulin. Her hemoglobin A1c in December 2017 was 8.4 which is a trend up from previous levels. She had blood work in February 2018 which revealed an albumin of 2.6 this appears to be relatively acute as an albumin in November 2017 was 3.7 11/13/16; this is a patient I have not seen since February who is readmitted to our clinic last week. 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 Bridgett Larsson. I have reviewed Dr. Lianne Moris notes from March/23/18. Doppler ABI on that date showed an ABI on the right of 0.48 and on the left of 0.63. Dorsalis pedis waveforms Jeanty, Kathyleen V. ([id number]) were monophasic bilaterally. There was no waveforms detected at the posterior tibial on the right, monophasic on the left. Dr. Lianne Moris comments were that this patient would have follow-up vascular studies in 3 months including ABIs and right lower extremity arterial duplex. She had an MRI in February that was negative for osteomyelitis but showed generalized edema in the foot. Last albumin I see was in January at 3.4 we have been using Santyl to the 4 wounds on the right leg the patient is noted today to have widespread edema well up towards her groin this is pitting 2-3+. 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 Normal ejection fraction. 11/20/16; patient has a follow-up appointment with Dr. Bridgett Larsson on April 23. She is still complaining of a lot of pain in the right foot and right leg. It is not clear to me that this is at all positional however I think it is clear claudication with minimal activity perhaps at rest. At our suggestion she is return to her primary physician's office tomorrow with regards to her pitting lower extremity bilateral edema that I reviewed in detail last week 11/27/16; the follow-up with Dr. Bridgett Larsson was actually on May 23 on April 23 as I  stated in my note last week. N/A case all of her wounds seems somewhat smaller. The 2 on the right leg are definitely smaller. The areas on the dorsal right first toe, right third toe and the lateral part of the right fifth metatarsal head all looks smaller but have tightly adherent surfaces. We have been using Santyl 12/04/16; follow-up with Dr. Bridgett Larsson on May 23. 2 small open wounds on the right leg continue to get smaller. The area on the right third total lateral aspect of the right fifth toe also look better. The remaining area on the dorsal first toe still has some depth to it. We have been using Santyl to the toes and collagen on the right 12/11/16; according to patient's husband the follow-up with Dr. Bridgett Larsson is not until July. All of the wounds on the right leg are measuring smaller. We have been using some combination of Prisma and Santyl although I think we can go to straight Prisma today. There may have been some confusion with home health about the primary dressing orders here. 12/25/16; the patient has had some healing this week. The area on her right  lateral fifth metatarsal head, right third toe are both healed lower right leg is healed. In the vein harvest site superiorly she has one superficial open area. On the dorsal aspect of her right great toe/MTP joint the wound is now divided into 2 however the proximal area is deep and there is palpable bone 01/01/17; still an open area in the middle of her original right surgical scar. The area on the right third toe and right lateral fifth metatarsal head remained closed. Problematic area on the dorsal aspect of the toe. Previous surgery in this area line 01/08/17 small open area on the original scar on her upper anterior leg although this is closing. X-ray I did of the right first toe did not show underlying bony abnormality. Still this area on the dorsal first toe probes to bone. We have been using Prisma 01/15/17; small open area in the original scar  in the upper anterior leg is almost fully closed. She has 2 open areas over the dorsal aspect of the right first toe that probes to bone. Used and a form starting last week. Vigorous bone scraping that I did last week showed few methicillin sensitive staph aureus. She is allergic to penicillin and sulfa drugs. I'm going to give her 2 weeks of doxycycline. She will need an MRI with contrast. 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 She continues on doxycycline for a bone scraping that showed methicillin sensitive staph aureus [allergic to penicillin and sulfa]. We have been using Endo form to the wound. 01/29/17; surprisingly her MRI did not show osteomyelitis. She continues on doxycycline for a bone scraping that showed methicillin sensitive staph aureus with allergies to penicillin and sulfa. We have been using Endo form to the wound. Unfortunately I cannot get a surface on this visit looks like it is able to support a healing state. Proximally there is still exposed bone. There is no overt soft tissue tenderness. Her MRI did show a previous fracture was surgery to this area but no hardware 02/12/17 on evaluation today patient appears to be doing well in regard to her lower extremity wound. She does have some mild discomfort but this is minimal. There's no evidence of infection. Her left great toe nail has somewhat been lifted up and there is a little bit of slight bleeding underneath but this is still firmly attached. 02/19/17; she ran out of doxycycline 2 days ago. Nevertheless I would like to continue this for 2 weeks to make a full 6 weeks of therapy as the bone scraping that I did from the open area on the dorsal right first toe showed MRSA. This should complete antibiotic therapy. 02/26/17; the patient is completing her 6 weeks of oral doxycycline. Deep wound on the dorsal right toe. This still has some exposed bone  proximally. She does have a reasonably granulated surface albeit thin over bone. I've been using Endo form have made application for an amniotic skin sub 03/05/17; the patient has completed 6 weeks of doxycycline. This is a deep wound on the dorsal right toe which has exposed bone proximally. She has granulation over most of this wound albeit a thin layer. I've been using Endo form. Still do not have approval for [employer] 03/13/17 on evaluation today patient's right foot wound appears to be doing better measurement wise compared to her last evaluation. She has been tolerating the dressing change without complication and  does have a appointment with the dietary that has been made as far as referral is concerned there just waiting for her and transportation to contact them back for an actual date. Nonetheless patient has been having less pain at this point. No fevers, chills, nausea, or vomiting noted at this time. 03/26/17; linear wound over the dorsal aspect of her right great toe. At one point this had exposed bone on the most superior aspect however I am pleased to see today that this appears to have a surface of granulation. Apparently product was applied Titus, Xitlalic V. ([id number]) for but denied by [facility]. We'll need to see what that was. The patient has known PAD status post revascularization. MRI did not show osteomyelitis which was done in June 04/02/17; continued improvement using Endoform. She was approved for [employer] but hasn't over $008 co-pay per application, this is beyond her means 04/09/17; continues on Endoform. 04/16/17; appears to be doing nicely continuing on Endoform 04/22/17; patient did not have her dressing changed all week because of the weather. Using Endoform. Base of the wound looks healthy elbow there appears to events surrounding maceration perhaps because of the drainage was not changing the wound dressing 04/30/17 wound today continues to close and except for a small divot on  the superior part of the wound. This area did not probe the bone but I found this a little concerning as this was the area with exposed bone before we be able to get this to granulate forward. As I remember things she is not a candidate for skin substitutes secondary to an outlandish co-pay. I asked her husband to look into the out of pocket max for medications if he has 1 [Regranex] or totally for skin substitutes 05/07/17; the patient arrives today with a wound roughly the same size although under careful inspection under the light there appears to be some epithelialization. Her intake nurse noted that the Endoform seemed to be placed over the wound rather than in the deeper divots they could really benefit from the Endoform. At this point I have no plans to change the Endoform as at one point this was at least 33% exposed bone and the rest of the wound very close to the underlying phalanx 05/14/17; no major change in the size or appearance of the wound. We have been using Endoform for a prolonged period of time with reasonably good improvement in the epithelialization i.e. no exposed bone especially proximally. Unless we've not made a lot of changes in the last several weeks. She does not complain of pain. She was revascularized early this year or perhaps late last year by Dr. Bridgett Larsson and I wonder if he will need to have a look at her, I will need to review the actual vascular procedure which I think was a distal popliteal bypass 05/21/17; switched to either for a blue last week. Patient arrived in clinic with the wound not measuring any better but the surface looking some better. Unfortunately she had some surface slough and when I went to remove this superiorly she had exposed bone. Previously she had had exposed bone in the inferior part of the wound however this is granulated over some weeks ago. Clearly this is a major step back for her. With some difficulty I was able to obtain a piece of the  bone probing through the superior part of the wound for culture. We did not send pathology. It is likely that she is going to need imaging of this site but  I did not order this today 05/28/17; bone culture I took last week showed MSSA. She still has exposed bone however I could not get another piece for pathology. She had an MRI 4 months ago that did not show osteoarthritic at time. Wound rapidly deteriorated superiorly and I suspect this is underlying osteomyelitis. We have been using Hydrofera Blue 06/04/17 on evaluation today patient's wound appears to actually be doing a little bit better visually compared to last week's evaluation which is good news. Nonetheless she does have osteomyelitis of the toe which does not appear to be MRSA. No fevers, chills, nausea, or vomiting noted at this time. She is having no discomfort. 06/11/17; patient's wound looks a little healthier than I remember seeing it 2 weeks ago. There is no exposed bone and the surface of the wound appears well granulated. We've been using hydrofera blue. I note that she was started on doxycycline which was MSSA. Her appointment with Dr. Ola Spurr is on November 12 I believe. 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 Vallarie Mare of vascular surgery on 11/9. He offered right leg angiography possible intervention however the patient refused. I've discussed this with the patient's husband today she did not want any further procedures. Also noteworthy that Dr. Vallarie Mare stated that this wound had not healed in 3 years, I was not aware of this degree of chronicity in this area. She has underlying osteomyelitis by bone culture. Culture of this grew MSSA, she is allergic to penicillin and therefore not a candidate for beta lactams. She saw Dr. Ola Spurr of infectious disease this morning, he continued her on doxycycline for another month and apparently sent in a prescription. We have been using Hydrofera Blue. ABI's  update by Dr. Bridgett Larsson right 0.62, left 0.89. Monophasic wave forms 06/25/17 on evaluation today patient appears to be doing well in regard to her right great toe wound. She is still taking the doxycycline as prescribed by Dr. Ola Spurr. The Hydrofera Blue Dressing's to be doing as well as anything has for her up to this point and we are still waiting on an insurance authorization for the [place]. She is having no pain which is good news. No fevers, chills, nausea, or vomiting noted at this time. 07/02/17; still taking doxycycline as prescribed by Dr. Ola Spurr. Hydrofera Blue continues. We have no palpable bone. Still have not had had approval of Apligraf 08/06/16; the patient arrives today with [facility] even though we apparently had changed to Sorbact last time. Her co-pay for Regranex is $389 in discussion with the patient and her husband this was excessive. We'll continue with the Sorbact and standard dressings for now 08/20/17; we have been using sorbact. The wound bed still requires debridement. Wound measurements are slightly better. 09/03/17;using Sorbact.no debridement today. Wound measurements slightly better. There is some discoloration of the tip of her toelooks like bruising Pangle, Carlisha V. ([id number]) 09/17/17; using Sorbact. No debridement today. Wound dimensions are about the same. Still some discoloration at the tip of her great toe. This does not look any worse than last time 10/01/17; using sorbact right great toe I thought she might have surface epithelialization last time although it is certainly not look like that today nevertheless her dimensions are better. Continued surface eschar on the tip of her toe but this is not progressing. She is actually complaining of the left great toe 10/15/17; this is a very difficult area on the dorsal aspect of her proximal right great toe. At one point this  wound had exposed bone however we have managed to get some degree of  epithelialization. She was revascularized by Dr. Vallarie Mare in [place]. She saw Dr. Ola Spurr for underlying osteomyelitis. She is not a candidate for advanced treatment options [insurance issues]. She comes in today with a new area on the tip of her right great toe. The exact history here is unclear. We have been using sorbact 10/29/17; patient arrives today with very significant bilateral increase edema which appears to extend into the thighs. This is tight and not particularly pitting however it looks a lot more impressive than I remember. Not surprisingly the wounds on her right great toe have increased in size with weeping edema fluid as the edema extends into the dorsal foot itself. She also has a wound on the tip of her right great toe which is a new wound from 2 weeks ago. We have been using sorbact. 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 He is on Demadex presumably twice a day at 20 mg. I have asked her family to make her an appointment with her primary physicians at the [facility] to go over this degree of edema. This is causing I think deterioration in the right great toe wound. She also has significant PAD and is status post right leg bypass graft. I think this was done by [facility]. I believe they also said that there was nothing further that could be done with regards to her vascular status. 11/12/17; patient is going to see her primary doctor this afternoon with regards to lower extremity edema. She has 2 open wounds on the right great toe the original wound on the dorsal proximal part and then a more necrotic looking area on the tip of her right great toe. We took some time today to review her vascular status. The patient had a relate below knee popliteal to peroneal artery bypass with reversed greater saphenous vein on 06/13/17; she also had endarterectomy of the midsegment of the  peroneal artery. 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 I think at some point in time after that she was offered an arteriogram on the right but she declined. I've used Silver collagen to her wounds recently. 11/26/17; patient has 2 open wounds on the right great toe tip as well as the dorsal aspect of her right great toe. She has known PAD. I've been trying to get her back to see vein and vascular in [facility] to see if there are options for endovascular surgery to help with perfusion although the patient and her husband may not want any more surgery. We've been using silver alginate because of maceration. She does not have an option for advanced treatment/skin substitutes 12/10/17; continued open wounds much the same as 2 weeks ago on the dorsal aspect of the right great toe on the right great toe tip. I've encouraged to follow-up with Dr. Bridgett Larsson of vein and vascular to explore the possibility that there may be correctable ischemia involved in nonhealing these areas. We made considerable progress on the dorsal right great toe however it is stalled recently. We do not have an advanced treatment option. Originally this had exposed bone however there is a granulated base. 12/24/17; patient went back to see vein and vascular in [place]. She was seen by Manus Gunning NP. ABIs  on the right were noted to be 0.84 and TBI of the right and 0.62. On the left her ABI was 0.82 and her TBI of 0.75. Waveforms were monophasic at the posterior tibial and dorsalis pedis bilaterally. 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 Her husband reiterates today that she does not want an arteriogram therefore I'm  not going to press the issue. We've been using silver collagen to the wounds on the tip of the right great toe and dorsal surface of the right great toe. 01/07/18; patient arrives today with the tip of her toe healed. An cerebral amount of slough over the wound on the dorsal toe. As noted on 12/24/17 no options currently for revascularization the patient will not allow an arteriogram 01/21/18; typical of her right great toe remains healed. Still large amount of slough over the wound on the dorsal toe. No options for revascularization. We've been using Medihoney 02/04/18; no major change. Still a punched out area on the right great toe dorsal wound. Covered in a difficult necrotic debris. 02/18/18; no major change. Still a punched out area over the right great toe. They're using Raeford 03/04/18; no major change. The wound is stalled. Still a punched out area over the right dorsal great toe. We've been using medihone for about a month not much change in the wound dimensions or the characteristics of the wound surface. We've previously used Iodoflex, Hydrofera Blue, sorbact, and I think at one point Lindrith. She has PAD and does not have any revascularization options although a wound distally on the tip of the great toe did close over 03/25/18; absolutely no change. Still adherent debris over the wound surface there is no palpable bone but the wound still has Goede, Marlis V. ([id number]) some depth. This is predominantly a arterial wound and she basically has refused further attempts at revascularization. I would consider this a totally palliative situation except for she managed to heal the wound at the tip of the same toe some months ago. I changed her back to Iodoflex The other issue is that [facility] called and wants to pare back on the visits which have been 3 times a week. They will drop to 2 times a week next week 04/29/18; since the patient was last here she was admitted to hospital  from 9/6 through 9/13. She was noted to have altered LOC, declining ability to walk. 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 She was discharged to [facility]. She is on oxygen chronically at 3 L. I don't think they've been doing anything specific to the wound. Her husband laments that she is no longer able to stand and walk or transfer. Yet he seems determined to take her home Electronic Signature(s) Signed: 04/29/2018 6:15:06 PM By: Linton Ham MD Entered By: Linton [name] on 04/29/2018 11:52:36 Banas, Gloriana [name] ([id number]) -------------------------------------------------------------------------------- Physical Exam Details Patient Name: Savoia, Kayleena V. Date of Service: 04/29/2018 11:00 AM Medical Record Number: [id number] Patient Account Number: [id number] Date of Birth/Sex: 03-10-1941 (77 y.o. F) Treating RN: Cornell Barman Primary Care Provider: Beverlyn Roux Other Clinician: Referring Provider: Beverlyn Roux Treating Provider/Extender: Tito Dine in Treatment: 36 Constitutional Sitting or standing Blood Pressure  is within target range for patient.. Pulse regular and within target range for patient.[name] Respirations regular, non-labored and within target range.[name] appears in no distress. Respiratory Respiratory effort is easy and symmetric bilaterally. Rate is normal at rest and on room air.[name] oxygen at 3 L. Cardiovascular pulses palpable on the right dorsalis pedis and posterior tibial. there is edema in both lower legs. Integumentary (Hair, Skin) chronic edema in the lower legs partially systemic fluid volume overload and partially lymphedema. Neurological he seems generally very weak. Question mild bilateral facial weakness. Psychiatric seems somewhat depressed and less responsive initially however she became more  animated. Notes wound exam; the original wound is on the dorsal aspect of the right great toe actually looked fairly healthy. We had been using Iodoflex however I think we can move to a silver collagen. In a nursing home we won't be able to get too much in terms of advanced treatment products there is no evidence of surrounding infection no debridement was necessary. Electronic Signature(s) Signed: 04/29/2018 6:15:06 PM By: Linton Ham MD Entered By: Linton [name] on 04/29/2018 11:55:35 Jamil, Ajayla [name] ([id number]) -------------------------------------------------------------------------------- Physician Orders Details Patient Name: Pontiff, Amberlea V. Date of Service: 04/29/2018 11:00 AM Medical Record Number: [id number] Patient Account Number: [id number] Date of Birth/Sex: February 11, 1941 (77 y.o. F) Treating RN: Cornell Barman Primary Care Provider: Beverlyn Roux Other Clinician: Referring Provider: Beverlyn Roux Treating Provider/Extender: Tito Dine in Treatment: 22 Verbal / Phone Orders: No Diagnosis Coding Wound Cleansing Wound #7 Right,Dorsal Metatarsal head first o Cleanse wound with mild soap and water Anesthetic (add to Medication List) Wound #7 Right,Dorsal Metatarsal head first o Topical Lidocaine 4% cream applied to wound bed prior to debridement (In Clinic Only). Primary Wound Dressing Wound #7 Right,Dorsal Metatarsal head first o Silver Collagen Secondary Dressing Wound #7 Right,Dorsal Metatarsal head first o ABD and Kerlix/Conform Dressing Change Frequency Wound #7 Right,Dorsal Metatarsal head first o Change Dressing Monday, Wednesday, Friday Follow-up Appointments Wound #7 Right,Dorsal Metatarsal head first o Return Appointment in 1 week. Edema Control Wound #7 Right,Dorsal Metatarsal head first o Patient to wear own compression stockings o Elevate legs to the level of the heart and pump ankles as often as possible Notes Patient is  currently at [facility]. Electronic Signature(s) Signed: 04/29/2018 5:31:05 PM By: Gretta Cool, BSN, RN, CWS, Kim RN, BSN Signed: 04/29/2018 6:15:06 PM By: Linton Ham MD Entered By: Gretta Cool, BSN, RN, CWS, Kim on 04/29/2018 11:26:44 Sohail, [name] ([id number]) -------------------------------------------------------------------------------- Problem List Details Patient Name: Mineer, Zorina V. Date of Service: 04/29/2018 11:00 AM Medical Record Number: [id number] Patient Account Number: [id number] Date of Birth/Sex: 1941/07/28 (76 y.o. 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 with 11/06/2016 No Yes unspecified severity E11.52 Type 2 diabetes mellitus with diabetic peripheral angiopathy 11/06/2016 No Yes with gangrene 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 ([id number]) -------------------------------------------------------------------------------- Progress Note  Details Patient Name: Roberg, Mariaclara V. Date of Service: 04/29/2018 11:00 AM Medical Record Number: [id number] Patient Account Number: [id number] Date of Birth/Sex: March 26, 1941 (77 y.o. F) Treating RN: Cornell Barman Primary Care Provider: Beverlyn Roux Other Clinician: Referring Provider: Beverlyn Roux Treating Provider/Extender: Tito Dine in Treatment: 41 Subjective History of Present Illness (HPI) 08/13/16: This is a 77 year old woman who came predominantly for review of 3 cm in diameter circular wound to the left anterior lateral leg. She was in the ER on 08/01/16 I reviewed their notes. There was apparently pus coming out of the wound at that time and the patient arrived requesting debridement which they don't do in the emergency room. Nevertheless I can't see that they did any x-rays. There were no cultures done. She is a type II diabetic and I a note after the patient was in the clinic that she had a bypass graft from the popliteal to the tibial on the right on 02/28/16. She also had a right greater saphenous vein harvest on the same date for arterial bypass. She is going to have vascular studies including ABIs T ABIs on the right on 08/28/16. The patient's surgery was on 02/28/16 by Dr. Vallarie Mare she had a right below the knee popliteal artery to peroneal artery bypass with reverse greater saphenous vein and an endarterectomy of the mid segment peroneal artery. Postoperatively she had a strong mild monophasic peroneal signal with a pink foot. It would appear that the patient is had some nonhealing in the surgical saphenous vein harvest site on the left leg. Surprisingly looking through [facility] I cannot see much information about this at all. Dr. Lucious Groves notes from 05/29/16 show that the patient's wounds "are not healed" the right first metatarsal wound healed but then opened back up. 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 At that point the patient had a wound VAC to her right calf with regards to the wounds on her dorsal right toe would appear that these are felt to be arterial wounds. She has had surgery on the metatarsal phalangeal in 2015 I Dr. Doran Durand secondary to a right metatarsal phalangeal joint fracture. She is apparently had discoloration around this area since then. 08/28/16; patient arrives with her wounds in much the same condition. The linear vein harvest site and the circular wound below it which I think was a blister. She also has to probing holes in her right great toe and a necrotic eschar on the right second toe. Because of these being arterial wounds I reduced her compression from 3-2 layers this seems to of done satisfactorily she has not had any problems. I cannot see that she is actually had an x-ray ====== 11/06/16 the patient comes in for evaluation of her right lower extremity ulcers. She was here in January 2018 for 2 visits subsequently ended up in the hospital with pneumonia and then to rehabilitation. She has now been discharged from rehabilitation and is home. She has multiple ulcerations to her right lower extremity including the foot and toes. She does have home health in place and they have been placing alginate to the ulcers. She is followed by Dr. Bridgett Larsson of vascular medicine. She is status post a  bypass graft to the right below knee popliteal to peroneal using reverse GSV in July 2017. She recently saw him on 3/23. In office ABIs were: Right 0.48 with monophasic flow to the DP, PT, peroneal Left 0.63 with monophasic flow to the DP and PT Her arterial studies indicated a patent right below knee popliteal to peroneal bypass She had an MRI in February 2008 that was negative frosty myelitis but this showed general soft tissue edema in the right foot and lower extremity concerning for cellulitis She is a diabetic, managed with insulin. Her  hemoglobin A1c in December 2017 was 8.4 which is a trend up from previous levels. She had blood work in February 2018 which revealed an albumin of 2.6 this appears to be relatively acute as an albumin in November 2017 was 3.7 11/13/16; this is a patient I have not seen since February who is readmitted to our clinic last week. 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 Bridgett Larsson. I have reviewed Dr. Lianne Moris notes from March/23/18. Doppler ABI on that date showed an ABI on the right of 0.48 and on the left of 0.63. Dorsalis pedis waveforms were monophasic bilaterally. There was no waveforms detected at the posterior tibial on the right, monophasic on the left. Dr. Lianne Moris comments were that this patient would have follow-up vascular studies in 3 months including ABIs and right lower Moravek, Moani V. ([id number]) extremity arterial duplex. She had an MRI in February that was negative for osteomyelitis but showed generalized edema in the foot. Last albumin I see was in January at 3.4 we have been using Santyl to the 4 wounds on the right leg the patient is noted today to have widespread edema well up towards her groin this is pitting 2-3+. 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 Normal ejection fraction. 11/20/16; patient has a follow-up appointment with Dr. Bridgett Larsson on April 23. She is still complaining of a lot of pain in the right foot and right leg. It is not clear to me that this is at all positional however I think it is clear claudication with minimal activity perhaps at rest. At our suggestion she is return to her primary physician's office tomorrow with regards to her pitting lower extremity bilateral edema that I reviewed in detail last week 11/27/16; the follow-up with Dr. Bridgett Larsson was actually on May 23 on April 23 as I stated in my note last week. N/A case all of her wounds seems  somewhat smaller. The 2 on the right leg are definitely smaller. The areas on the dorsal right first toe, right third toe and the lateral part of the right fifth metatarsal head all looks smaller but have tightly adherent surfaces. We have been using Santyl 12/04/16; follow-up with Dr. Bridgett Larsson on May 23. 2 small open wounds on the right leg continue to get smaller. The area on the right third total lateral aspect of the right fifth toe also look better. The remaining area on the dorsal first toe still has some depth to it. We have been using Santyl to the toes and collagen on the right 12/11/16; according to patient's husband the follow-up with Dr. Bridgett Larsson is not until July. All of the wounds on the right leg are measuring smaller. We have been using some combination of Prisma and Santyl although I think we can go to straight Prisma today. There may have  been some confusion with home health about the primary dressing orders here. 12/25/16; the patient has had some healing this week. The area on her right lateral fifth metatarsal head, right third toe are both healed lower right leg is healed. In the vein harvest site superiorly she has one superficial open area. On the dorsal aspect of her right great toe/MTP joint the wound is now divided into 2 however the proximal area is deep and there is palpable bone 01/01/17; still an open area in the middle of her original right surgical scar. The area on the right third toe and right lateral fifth metatarsal head remained closed. Problematic area on the dorsal aspect of the toe. Previous surgery in this area line 01/08/17 small open area on the original scar on her upper anterior leg although this is closing. X-ray I did of the right first toe did not show underlying bony abnormality. Still this area on the dorsal first toe probes to bone. We have been using Prisma 01/15/17; small open area in the original scar in the upper anterior leg is almost fully closed. She has 2  open areas over the dorsal aspect of the right first toe that probes to bone. Used and a form starting last week. Vigorous bone scraping that I did last week showed few methicillin sensitive staph aureus. She is allergic to penicillin and sulfa drugs. I'm going to give her 2 weeks of doxycycline. She will need an MRI with contrast. 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 She continues on doxycycline for a bone scraping that showed methicillin sensitive staph aureus [allergic to penicillin and sulfa]. We have been using Endo form to the wound. 01/29/17; surprisingly her MRI did not show osteomyelitis. She continues on doxycycline for a bone scraping that showed methicillin sensitive staph aureus with allergies to penicillin and sulfa. We have been using Endo form to the wound. Unfortunately I cannot get a surface on this visit looks like it is able to support a healing state. Proximally there is still exposed bone. There is no overt soft tissue tenderness. Her MRI did show a previous fracture was surgery to this area but no hardware 02/12/17 on evaluation today patient appears to be doing well in regard to her lower extremity wound. She does have some mild discomfort but this is minimal. There's no evidence of infection. Her left great toe nail has somewhat been lifted up and there is a little bit of slight bleeding underneath but this is still firmly attached. 02/19/17; she ran out of doxycycline 2 days ago. Nevertheless I would like to continue this for 2 weeks to make a full 6 weeks of therapy as the bone scraping that I did from the open area on the dorsal right first toe showed MRSA. This should complete antibiotic therapy. 02/26/17; the patient is completing her 6 weeks of oral doxycycline. Deep wound on the dorsal right toe. This still has some exposed bone proximally. She does have a reasonably granulated surface  albeit thin over bone. I've been using Endo form have made application for an amniotic skin sub 03/05/17; the patient has completed 6 weeks of doxycycline. This is a deep wound on the dorsal right toe which has exposed bone proximally. She has granulation over most of this wound albeit a thin layer. I've been using Endo form. Still do not have approval for Affinity 03/13/17 on evaluation today  patient's right foot wound appears to be doing better measurement wise compared to her last evaluation. She has been tolerating the dressing change without complication and does have a appointment with the dietary that has been made as far as referral is concerned there just waiting for her and transportation to contact them back for an actual date. Nonetheless patient has been having less pain at this point. No fevers, chills, nausea, or vomiting noted at this time. 03/26/17; linear wound over the dorsal aspect of her right great toe. At one point this had exposed bone on the most superior aspect however I am pleased to see today that this appears to have a surface of granulation. Apparently product was applied for but denied by [facility]. We'll need to see what that was. The patient has known PAD status post revascularization. MRI did not show osteomyelitis which was done in June Tindall, Elin V. ([id number]) 04/02/17; continued improvement using Endoform. She was approved for [employer]asis but hasn't over $338 co-pay per application, this is beyond her means 04/09/17; continues on Endoform. 04/16/17; appears to be doing nicely continuing on Endoform 04/22/17; patient did not have her dressing changed all week because of the weather. Using Endoform. Base of the wound looks healthy elbow there appears to events surrounding maceration perhaps because of the drainage was not changing the wound dressing 04/30/17 wound today continues to close and except for a small divot on the superior part of the wound. This area did not probe  the bone but I found this a little concerning as this was the area with exposed bone before we be able to get this to granulate forward. As I remember things she is not a candidate for skin substitutes secondary to an outlandish co-pay. I asked her husband to look into the out of pocket max for medications if he has 1 [Regranex] or totally for skin substitutes 05/07/17; the patient arrives today with a wound roughly the same size although under careful inspection under the light there appears to be some epithelialization. Her intake nurse noted that the Endoform seemed to be placed over the wound rather than in the deeper divots they could really benefit from the Endoform. At this point I have no plans to change the Endoform as at one point this was at least 33% exposed bone and the rest of the wound very close to the underlying phalanx 05/14/17; no major change in the size or appearance of the wound. We have been using Endoform for a prolonged period of time with reasonably good improvement in the epithelialization i.e. no exposed bone especially proximally. Unless we've not made a lot of changes in the last several weeks. She does not complain of pain. She was revascularized early this year or perhaps late last year by Dr. Bridgett Larsson and I wonder if he will need to have a look at her, I will need to review the actual vascular procedure which I think was a distal popliteal bypass 05/21/17; switched to either for a blue last week. Patient arrived in clinic with the wound not measuring any better but the surface looking some better. Unfortunately she had some surface slough and when I went to remove this superiorly she had exposed bone. Previously she had had exposed bone in the inferior part of the wound however this is granulated over some weeks ago. Clearly this is a major step back for her. With some difficulty I was able to obtain a piece of the bone probing through the superior  part of the wound for  culture. We did not send pathology. It is likely that she is going to need imaging of this site but I did not order this today 05/28/17; bone culture I took last week showed MSSA. She still has exposed bone however I could not get another piece for pathology. She had an MRI 4 months ago that did not show osteoarthritic at time. Wound rapidly deteriorated superiorly and I suspect this is underlying osteomyelitis. We have been using Hydrofera Blue 06/04/17 on evaluation today patient's wound appears to actually be doing a little bit better visually compared to last week's evaluation which is good news. Nonetheless she does have osteomyelitis of the toe which does not appear to be MRSA. No fevers, chills, nausea, or vomiting noted at this time. She is having no discomfort. 06/11/17; patient's wound looks a little healthier than I remember seeing it 2 weeks ago. There is no exposed bone and the surface of the wound appears well granulated. We've been using hydrofera blue. I note that she was started on doxycycline which was MSSA. Her appointment with Dr. Ola Spurr is on November 12 I believe. 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 Vallarie Mare of vascular surgery on 11/9. He offered right leg angiography possible intervention however the patient refused. I've discussed this with the patient's husband today she did not want any further procedures. Also noteworthy that Dr. Vallarie Mare stated that this wound had not healed in 3 years, I was not aware of this degree of chronicity in this area. She has underlying osteomyelitis by bone culture. Culture of this grew MSSA, she is allergic to penicillin and therefore not a candidate for beta lactams. She saw Dr. Ola Spurr of infectious disease this morning, he continued her on doxycycline for another month and apparently sent in a prescription. We have been using Hydrofera Blue. ABI's update by Dr. Bridgett Larsson right 0.62, left 0.89. Monophasic wave  forms 06/25/17 on evaluation today patient appears to be doing well in regard to her right great toe wound. She is still taking the doxycycline as prescribed by Dr. Ola Spurr. The Hydrofera Blue Dressing's to be doing as well as anything has for her up to this point and we are still waiting on an insurance authorization for the [place]. She is having no pain which is good news. No fevers, chills, nausea, or vomiting noted at this time. 07/02/17; still taking doxycycline as prescribed by Dr. Ola Spurr. Hydrofera Blue continues. We have no palpable bone. Still have not had had approval of Apligraf 08/06/16; the patient arrives today with [facility] even though we apparently had changed to Sorbact last time. Her co-pay for Regranex is $389 in discussion with the patient and her husband this was excessive. We'll continue with the Sorbact and standard dressings for now 08/20/17; we have been using sorbact. The wound bed still requires debridement. Wound measurements are slightly better. 09/03/17;using Sorbact.no debridement today. Wound measurements slightly better. There is some discoloration of the tip of her toelooks like bruising 09/17/17; using Sorbact. No debridement today. Wound dimensions are about the same. Still some discoloration at the tip of her great toe. This does not look any worse than last time Hallum, Madelynn V. ([id number]) 10/01/17; using sorbact right great toe I thought she might have surface epithelialization last time although it is certainly not look like that today nevertheless her dimensions are better. Continued surface eschar on the tip of her toe but this is not progressing. She is actually complaining  of the left great toe 10/15/17; this is a very difficult area on the dorsal aspect of her proximal right great toe. At one point this wound had exposed bone however we have managed to get some degree of epithelialization. She was revascularized by Dr. Vallarie Mare in [place]. She  saw Dr. Ola Spurr for underlying osteomyelitis. She is not a candidate for advanced treatment options [insurance issues]. She comes in today with a new area on the tip of her right great toe. The exact history here is unclear. We have been using sorbact 10/29/17; patient arrives today with very significant bilateral increase edema which appears to extend into the thighs. This is tight and not particularly pitting however it looks a lot more impressive than I remember. Not surprisingly the wounds on her right great toe have increased in size with weeping edema fluid as the edema extends into the dorsal foot itself. She also has a wound on the tip of her right great toe which is a new wound from 2 weeks ago. We have been using sorbact. 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 He is on Demadex presumably twice a day at 20 mg. I have asked her family to make her an appointment with her primary physicians at the [facility] to go over this degree of edema. This is causing I think deterioration in the right great toe wound. She also has significant PAD and is status post right leg bypass graft. I think this was done by [facility]. I believe they also said that there was nothing further that could be done with regards to her vascular status. 11/12/17; patient is going to see her primary doctor this afternoon with regards to lower extremity edema. She has 2 open wounds on the right great toe the original wound on the dorsal proximal part and then a more necrotic looking area on the tip of her right great toe. We took some time today to review her vascular status. The patient had a relate below knee popliteal to peroneal artery bypass with reversed greater saphenous vein on 06/13/17; she also had endarterectomy of the midsegment of the peroneal artery. 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 I think at some point in time after that she was offered an arteriogram on the right but she declined. I've used Silver collagen to her wounds recently. 11/26/17; patient has 2 open wounds on the right great toe tip as well as the dorsal aspect of her right great toe. She has known PAD. I've been trying to get her back to see vein and vascular in [facility] to see if there are options for endovascular surgery to help with perfusion although the patient and her husband may not want any more surgery. We've been using silver alginate because of maceration. She does not have an option for advanced treatment/skin substitutes 12/10/17; continued open wounds much the same as 2 weeks ago on the dorsal aspect of the right great toe on the right great toe tip. I've encouraged to follow-up with Dr. Bridgett Larsson of vein and vascular to explore the possibility that there may be correctable ischemia involved in nonhealing these areas. We made considerable progress on the dorsal right great toe however it is stalled recently. We do not have an advanced treatment option. Originally this had exposed  bone however there is a granulated base. 12/24/17; patient went back to see vein and vascular in [place] She was seen by Manus Gunning NP. ABIs on the right were noted to be 0.84 and TBI of the right and 0.62. On the left her ABI was 0.82 and her TBI of 0.75. Waveforms were monophasic at the posterior tibial and dorsalis pedis bilaterally. 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 Her husband reiterates today that she does not want an arteriogram therefore I'm not going to press the issue. We've been using silver collagen to the  wounds on the tip of the right great toe and dorsal surface of the right great toe. 01/07/18; patient arrives today with the tip of her toe healed. An cerebral amount of slough over the wound on the dorsal toe. As noted on 12/24/17 no options currently for revascularization the patient will not allow an arteriogram 01/21/18; typical of her right great toe remains healed. Still large amount of slough over the wound on the dorsal toe. No options for revascularization. We've been using Medihoney 02/04/18; no major change. Still a punched out area on the right great toe dorsal wound. Covered in a difficult necrotic debris. 02/18/18; no major change. Still a punched out area over the right great toe. They're using Culver 03/04/18; no major change. The wound is stalled. Still a punched out area over the right dorsal great toe. We've been using medihone for about a month not much change in the wound dimensions or the characteristics of the wound surface. We've previously used Iodoflex, Hydrofera Blue, sorbact, and I think at one point Sombrillo. She has PAD and does not have any revascularization options although a wound distally on the tip of the great toe did close over 03/25/18; absolutely no change. Still adherent debris over the wound surface there is no palpable bone but the wound still has some depth. This is predominantly a arterial wound and she basically has refused further attempts at revascularization. I would consider this a totally palliative situation except for she managed to heal the wound at the tip of the same toe some Allum, Jeidi V. ([id number]) months ago. I changed her back to Iodoflex The other issue is that [facility] has called and wants to pare back on the visits which have been 3 times a week. They will drop to 2 times a week next week 04/29/18; since the patient was last here she was admitted to hospital from 9/6 through 9/13. She was noted to have altered LOC, declining  ability to walk. 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 She was discharged to [facility]. She is on oxygen chronically at 3 L. I don't think they've been doing anything specific to the wound. Her husband laments that she is no longer able to stand and walk or transfer. Yet he seems determined to take her home Objective Constitutional Sitting or standing Blood Pressure is within target range for patient.. Pulse regular and within target range for patient.[name] Respirations regular, non-labored and within target range.[name] appears in no distress. Vitals Time Taken: 11:06 AM, Height: 64 in, Weight: 200 lbs, BMI: 34.3, Temperature: 98.1 F, Pulse: 72 bpm, Respiratory Rate: 18 breaths/min,  Blood Pressure: 112/64 mmHg. Respiratory Respiratory effort is easy and symmetric bilaterally. Rate is normal at rest and on room air.[name] oxygen at 3 L. Cardiovascular pulses palpable on the right dorsalis pedis and posterior tibial. there is edema in both lower legs. Neurological he seems generally very weak. Question mild bilateral facial weakness. Psychiatric seems somewhat depressed and less responsive initially however she became more animated. General Notes: wound exam; the original wound is on the dorsal aspect of the right great toe actually looked fairly healthy. We had been using Iodoflex however I think we can move to a silver collagen. In a nursing home we won't be able to get too much in terms of advanced treatment products there is no evidence of surrounding infection no debridement was necessary. Integumentary (Hair, Skin) chronic edema in the lower legs partially systemic fluid volume overload and partially lymphedema. Wound #7 status is Open. Original cause of wound was Gradually Appeared. The wound is located on the Right,Dorsal Metatarsal head first.  The wound measures 2cm length x 0.8cm width x 0.3cm depth; 1.257cm^2 area and 0.377cm^3 volume. There is no tunneling or undermining noted. There is a large amount of serous drainage noted. The wound margin is distinct with the outline attached to the wound base. There is small (1-33%) pink granulation within the wound bed. There is a large (67-100%) amount of necrotic tissue within the wound bed including Adherent Slough. The periwound skin appearance exhibited: Maceration. Periwound temperature was noted as No Abnormality. The periwound has tenderness on palpation. 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 Primary Wound Dressing: Wound #7 Right,Dorsal Metatarsal head first: Silver Collagen Secondary Dressing: Wound #7 Right,Dorsal Metatarsal head first: ABD and Kerlix/Conform Dressing Change Frequency: Wound #7 Right,Dorsal Metatarsal head first: Change Dressing Monday, Wednesday, Friday Follow-up Appointments: Wound #7 Right,Dorsal Metatarsal head first: Return Appointment in 1 week. Edema Control: Wound #7 Right,Dorsal Metatarsal head first: Patient to wear own compression stockings Elevate legs to the level of the heart and pump ankles as often as possible General Notes: Patient is currently at [facility]. #1 I changed her primary dressing to silver  collagen #2 had some thoughts about Regranex. This would not classify as a wound care advance dressing #3 I'm not really sure what happened to this woman however she is apparently now more immobilized than ever. She had a CT scan that did not show a stroke, did show small vessel disease unchanged from last time they did not do an MRI Electronic Signature(s) Signed: 04/29/2018 6:15:06 PM By: Linton Ham MD Tennison, ARDYN FORGE ([id number]) Entered By: Linton [name] on 04/29/2018 12:00:59 Erker, Gloris V. ([id number]) -------------------------------------------------------------------------------- SuperBill Details Patient Name: Shibuya, Yaretzi V. Date of Service: 04/29/2018 Medical Record Number: [id number] Patient Account Number: [id number] Date of Birth/Sex: 30-Dec-1940 (77 y.o. 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 E11.52 Type 2 diabetes mellitus with diabetic peripheral angiopathy with gangrene I70.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 of foot Facility Procedures CPT4 Code: [id number] Description: 99213 - WOUND CARE VISIT-LEV 3 EST PT Modifier: Quantity: 1 Physician Procedures CPT4 Code Description: [id number] [place] - WC PHYS LEVEL 3 - EST PT ICD-10 Diagnosis Description L97.519 Non-pressure chronic ulcer of other part of right foot with unspe I70.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 o E11.622 Type  2 diabetes mellitus with other skin ulcer Modifier: cified severity f other part of fo Quantity: 1 ot Electronic Signature(s) Signed: 04/29/2018  6:15:06 PM By: Linton Ham MD Entered By: Linton [name] on 04/29/2018 12:01:36

## 2018-05-01 NOTE — Progress Notes (Signed)
JANNATUL, WOJDYLA ([id number]) Visit Report for 04/29/2018 Arrival Information Details Patient Name: Sortor, DANNON PERLOW. Date of Service: 04/29/2018 11:00 AM Medical Record Number: [id number] Patient Account Number: [id number] Date of Birth/Sex: [birth year] (77 y.o. F) Treating RN: Roger Shelter Primary Care Aaylah Pokorny: Beverlyn Roux Other Clinician: Referring Yohan Samons: Beverlyn Roux Treating Maxie Debose/Extender: Tito Dine in Treatment: 45 Visit Information History Since Last Visit All ordered tests and consults were completed: No Patient Arrived: Wheel Chair Added or deleted any medications: No Arrival Time: 11:05 Any new allergies or adverse reactions: No Accompanied By: husband Had a fall or experienced change in No activities of daily living that may affect Transfer Assistance: None risk of falls: Patient Identification Verified: Yes Signs or symptoms of abuse/neglect since last visito No Secondary Verification Process Completed: Yes Hospitalized since last visit: No Patient Requires Transmission-Based No Implantable device outside of the clinic excluding No Precautions: cellular tissue based products placed in the center Patient Has Alerts: No since last visit: Pain Present Now: No Electronic Signature(s) Signed: 04/29/2018 4:06:29 PM By: Roger Shelter Entered By: Roger Shelter on 04/29/2018 11:06:20 Ingham, Icel [name] ([id number]) -------------------------------------------------------------------------------- Clinic Level of Care Assessment Details Patient Name: Fitting, [place] V. Date of Service: 04/29/2018 11:00 AM Medical Record Number: [id number] Patient Account Number: [id number] Date of Birth/Sex: 06-Nov-1940 (77 y.o. F) Treating RN: Cornell Barman Primary Care Zailey Audia: Beverlyn Roux Other Clinician: Referring Haakon Titsworth: Beverlyn Roux Treating Sair Faulcon/Extender: Tito Dine in Treatment: 55 Clinic Level of Care Assessment Items TOOL 4 Quantity Score []  -  Use when only an EandM is performed on FOLLOW-UP visit 0 ASSESSMENTS - Nursing Assessment / Reassessment []  - Reassessment of Co-morbidities (includes updates in patient status) 0 X- 1 5 Reassessment of Adherence to Treatment Plan ASSESSMENTS - Wound and Skin Assessment / Reassessment X - Simple Wound Assessment / Reassessment - one wound 1 5 []  - 0 Complex Wound Assessment / Reassessment - multiple wounds []  - 0 Dermatologic / Skin Assessment (not related to wound area) ASSESSMENTS - Focused Assessment []  - Circumferential Edema Measurements - multi extremities 0 []  - 0 Nutritional Assessment / Counseling / Intervention []  - 0 Lower Extremity Assessment (monofilament, tuning fork, pulses) []  - 0 Peripheral Arterial Disease Assessment (using hand held doppler) ASSESSMENTS - Ostomy and/or Continence Assessment and Care []  - Incontinence Assessment and Management 0 []  - 0 Ostomy Care Assessment and Management (repouching, etc.) PROCESS - Coordination of Care X - Simple Patient / Family Education for ongoing care 1 15 []  - 0 Complex (extensive) Patient / Family Education for ongoing care X- 1 10 Staff obtains Programmer, systems, Records, Test Results / Process Orders []  - 0 Staff telephones HHA, Nursing Homes / Clarify orders / etc []  - 0 Routine Transfer to another Facility (non-emergent condition) []  - 0 Routine Hospital Admission (non-emergent condition) []  - 0 New Admissions / Biomedical engineer / Ordering NPWT, Apligraf, etc. []  - 0 Emergency Hospital Admission (emergent condition) X- 1 10 Simple Discharge Coordination Bains, Meigan V. ([id number]) []  - 0 Complex (extensive) Discharge Coordination PROCESS - Special Needs []  - Pediatric / Minor Patient Management 0 []  - 0 Isolation Patient Management []  - 0 Hearing / Language / Visual special needs []  - 0 Assessment of Community assistance (transportation, D/C planning, etc.) []  - 0 Additional assistance / Altered  mentation []  - 0 Support Surface(s) Assessment (bed, cushion, seat, etc.) INTERVENTIONS - Wound Cleansing / Measurement X - Simple Wound Cleansing - one wound 1 5 []  - 0 Complex  Wound Cleansing - multiple wounds X- 1 5 Wound Imaging (photographs - any number of wounds) []  - 0 Wound Tracing (instead of photographs) X- 1 5 Simple Wound Measurement - one wound []  - 0 Complex Wound Measurement - multiple wounds INTERVENTIONS - Wound Dressings []  - Small Wound Dressing one or multiple wounds 0 X- 1 15 Medium Wound Dressing one or multiple wounds []  - 0 Large Wound Dressing one or multiple wounds []  - 0 Application of Medications - topical []  - 0 Application of Medications - injection INTERVENTIONS - Miscellaneous []  - External ear exam 0 []  - 0 Specimen Collection (cultures, biopsies, blood, body fluids, etc.) []  - 0 Specimen(s) / Culture(s) sent or taken to Lab for analysis []  - 0 Patient Transfer (multiple staff / Civil Service fast streamer / Similar devices) []  - 0 Simple Staple / Suture removal (25 or less) []  - 0 Complex Staple / Suture removal (26 or more) []  - 0 Hypo / Hyperglycemic Management (close monitor of Blood Glucose) []  - 0 Ankle / Brachial Index (ABI) - do not check if billed separately X- 1 5 Vital Signs Hanken, [place] V. ([id number]) Has the patient been seen at the hospital within the last three years: Yes Total Score: 80 Level Of Care: New/Established - Level 3 Electronic Signature(s) Signed: 04/29/2018 5:31:05 PM By: Gretta Cool, BSN, RN, CWS, Kim RN, BSN Entered By: Gretta Cool, BSN, RN, CWS, Kim on 04/29/2018 11:27:35 Schuneman, [name] ([id number]) -------------------------------------------------------------------------------- Encounter Discharge Information Details Patient Name: Reza, Lavaughn V. Date of Service: 04/29/2018 11:00 AM Medical Record Number: [id number] Patient Account Number: [id number] Date of Birth/Sex: [birth year] (77 y.o. F) Treating RN: Montey Hora Primary  Care Shandreka Dante: Beverlyn Roux Other Clinician: Referring Dinnis Rog: Beverlyn Roux Treating Devontay Celaya/Extender: Tito Dine in Treatment: 38 Encounter Discharge Information Items Discharge Condition: Stable Ambulatory Status: Wheelchair Discharge Destination: [place] Telephoned: No Orders Sent: Yes Transportation: Private Auto Accompanied By: spouse Schedule Follow-up Appointment: Yes Clinical Summary of Care: Electronic Signature(s) Signed: 04/29/2018 11:56:26 AM By: Montey Hora Entered By: Montey Hora on 04/29/2018 11:56:25 Deoliveira, Rocquel V. ([id number]) -------------------------------------------------------------------------------- Lower Extremity Assessment Details Patient Name: Butler, Megahn V. Date of Service: 04/29/2018 11:00 AM Medical Record Number: [id number] Patient Account Number: [id number] Date of Birth/Sex: 07-Mar-1941 (77 y.o. F) Treating RN: Roger Shelter Primary Care Shaunte Weissinger: Beverlyn Roux Other Clinician: Referring Elpidia Karn: Beverlyn Roux Treating Raenah Murley/Extender: Tito Dine in Treatment: 77 Edema Assessment Assessed: [Left: No] [Right: No] Edema: [Left: Ye] [Right: s] Calf Left: Right: Point of Measurement: 35 cm From Medial Instep cm 43.5 cm Ankle Left: Right: Point of Measurement: 12 cm From Medial Instep cm 30 cm Vascular Assessment Claudication: Claudication Assessment [Right:None] Pulses: Dorsalis Pedis Palpable: [Right:Yes] Posterior Tibial Extremity colors, hair growth, and conditions: Extremity Color: [Right:Hyperpigmented] Hair Growth on Extremity: [Right:Yes] Temperature of Extremity: [Right:Warm] Capillary Refill: [Right:> 3 seconds] Toe Nail Assessment Left: Right: Thick: Yes Discolored: Yes Deformed: Yes Improper Length and Hygiene: Yes Electronic Signature(s) Signed: 04/29/2018 4:06:29 PM By: Roger Shelter Entered By: Roger Shelter on 04/29/2018 11:16:19 Genest, Lianna V.  ([id number]) -------------------------------------------------------------------------------- Multi Wound Chart Details Patient Name: Backs, Larri V. Date of Service: 04/29/2018 11:00 AM Medical Record Number: [id number] Patient Account Number: [id number] Date of Birth/Sex: [birth year] (77 y.o. 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] Comorbid History: [7:Cataracts, Chronic sinus problems/congestion, Congestive Heart Failure, Hypertension, Type II Diabetes] [N/A:N/A] Date Acquired: [7:08/06/2013] [N/A:N/A] Weeks of Treatment: [7:77] [N/A:N/A] Wound Status: [7:Open] [N/A:N/A] Measurements L x W x D [7:2x0.8x0.3] [N/A:N/A] (cm) Area (cm) : [7:1.257] [N/A:N/A] Volume (cm) : [7:0.377] [N/A:N/A] % Reduction in Area: [7:-6.70%] [N/A:N/A] % Reduction in Volume: [7:-6.80%] [N/A:N/A] Classification: [7:Grade 2] [N/A:N/A] Exudate Amount: [7:Large] [N/A:N/A] Exudate Type: [7:Serous] [N/A:N/A] Exudate Color: [7:amber] [N/A:N/A] Wound Margin: [7:Distinct, outline attached] [N/A:N/A] Granulation Amount: [7:Small (1-33%)] [N/A:N/A] Granulation Quality: [7:Pink] [N/A:N/A] Necrotic Amount: [7:Large (67-100%)] [N/A:N/A] Exposed Structures: [7:Fascia: No Fat Layer (Subcutaneous Tissue) Exposed: No Tendon: No Muscle: No Joint: No Bone: No] [N/A:N/A] Epithelialization: [7:None] [N/A:N/A] Periwound Skin Texture: [7:No Abnormalities Noted] [N/A:N/A] Periwound Skin  Moisture: [7:Maceration: Yes] [N/A:N/A] Periwound Skin Color: No Abnormalities Noted N/A N/A Temperature: No Abnormality N/A N/A Tenderness on Palpation: Yes N/A N/A Wound Preparation: Ulcer Cleansing: N/A N/A Rinsed/Irrigated with Saline Topical Anesthetic Applied: Other: lidocaine 4% Treatment Notes Electronic Signature(s) Signed: 04/29/2018 6:15:06 PM By: Linton Ham MD Entered By: Linton Ham on 04/29/2018 11:50:41 Garbers, Spring [name] ([id number]) -------------------------------------------------------------------------------- Multi-Disciplinary Care Plan Details Patient Name: Bega, Saxon V. Date of Service: 04/29/2018 11:00 AM Medical Record Number: [id number] Patient Account Number: [id number] Date of Birth/Sex: Jun 12, 1941 (77 y.o. F) Treating RN: Cornell Barman Primary Care Venna Berberich: Beverlyn Roux Other Clinician: Referring Ziyonna Christner: Beverlyn Roux Treating Shatana Saxton/Extender: Tito Dine in Treatment: 2 Active Inactive ` Abuse / Safety / Falls / Self Care Management Nursing Diagnoses: Impaired physical mobility Potential for falls Goals: Patient will remain injury free Date Initiated: 11/06/2016 Target Resolution Date: 12/30/2016 Goal Status: Active Patient/caregiver will verbalize understanding of skin care regimen Date Initiated: 11/06/2016 Target Resolution Date: 12/30/2016 Goal Status: Active Interventions: Assess fall risk on admission and as needed Treatment Activities: Patient referred to home care : 11/06/2016 Notes: ` Nutrition Nursing Diagnoses: Potential for alteratiion in Nutrition/Potential for imbalanced nutrition Goals: Patient/caregiver verbalizes understanding of need to maintain therapeutic glucose control per primary care physician Date Initiated: 11/06/2016 Target Resolution Date: 12/30/2016 Goal Status: Active Interventions: Provide education on elevated blood sugars and impact on wound healing Notes: ` Orientation to the Wound Care  Program Nursing Diagnoses: Knowledge deficit related to the wound healing center program ALLESSANDRA, BERNARDI ([id number]) Goals: Patient/caregiver will verbalize understanding of the [place] Program Date Initiated: 11/06/2016 Target Resolution Date: 12/30/2016 Goal Status: Active Interventions: Provide education on orientation to the wound center Notes: ` Venous Leg Ulcer Nursing Diagnoses: Actual venous Insuffiency (use after diagnosis is confirmed) Knowledge deficit related to disease process and management Goals: Non-invasive venous studies are completed as ordered Date Initiated: 11/06/2016 Target Resolution Date: 12/30/2016 Goal Status: Active Patient/caregiver will verbalize understanding of disease process and disease management Date Initiated: 11/06/2016 Target Resolution Date: 12/30/2016 Goal Status: Active Interventions: Assess peripheral edema status every visit. Notes: ` Wound/Skin Impairment Nursing Diagnoses: Impaired tissue integrity Knowledge deficit related to smoking impact on wound healing Knowledge deficit related to ulceration/compromised skin integrity Goals: Ulcer/skin breakdown will heal within 14 weeks Date Initiated: 11/06/2016 Target Resolution Date: 02/05/2017 Goal Status: Active Interventions: Assess ulceration(s) every visit Treatment Activities: Skin care regimen initiated : 11/06/2016 Notes: Electronic Signature(s) Signed: 04/29/2018 5:31:05 PM By: Gretta Cool, BSN, RN, CWS, Kim RN, BSN Hogrefe, Tijah V. ([id number]) Entered By: Gretta Cool, BSN, RN, CWS, Kim on 04/29/2018 11:22:20  Vine, Juniper V. ([id number]) -------------------------------------------------------------------------------- Pain Assessment Details Patient Name: [name], Luberta V. Date of Service: 04/29/2018 11:00 AM Medical Record Number: [id number] Patient Account Number: [id number] Date of Birth/Sex: August 24, 1940 (77 y.o. F) Treating RN: Roger Shelter Primary Care Apollos Tenbrink: Beverlyn Roux  Other Clinician: Referring Avacyn Kloosterman: Beverlyn Roux Treating Khayree Delellis/Extender: Tito Dine in Treatment: 48 Active Problems Location of Pain Severity and Description of Pain Patient Has Paino No Site Locations Pain Management and Medication Current Pain Management: Electronic Signature(s) Signed: 04/29/2018 4:06:29 PM By: Roger Shelter Entered By: Roger Shelter on 04/29/2018 11:06:27 Yasin, Dinesha [name] ([id number]) -------------------------------------------------------------------------------- Patient/Caregiver Education Details Patient Name: Slagel, Pranathi V. Date of Service: 04/29/2018 11:00 AM Medical Record Number: [id number] Patient Account Number: [id number] Date of Birth/Gender: 21-Mar-1941 (77 y.o. F) Treating RN: Cornell Barman Primary Care Physician: Beverlyn Roux Other Clinician: Referring Physician: Beverlyn Roux Treating Physician/Extender: Tito Dine in Treatment: 71 Education Assessment Education Provided To: Patient Education Topics Provided Wound/Skin Impairment: Handouts: Caring for Your Ulcer, Other: Send orders to SNF Methods: Demonstration, Explain/Verbal Responses: State content correctly Electronic Signature(s) Signed: 04/29/2018 5:31:05 PM By: Gretta Cool, BSN, RN, CWS, Kim RN, BSN Entered By: Gretta Cool, BSN, RN, CWS, Kim on 04/29/2018 11:28:25 Scheiderer, [name] ([id number]) -------------------------------------------------------------------------------- Wound Assessment Details Patient Name: Stahnke, Annelle V. Date of Service: 04/29/2018 11:00 AM Medical Record Number: [id number] Patient Account Number: [id number] Date of Birth/Sex: [birth year] (77 y.o. F) Treating RN: Roger Shelter Primary Care Sudeep Scheibel: Beverlyn Roux Other Clinician: Referring Octa Uplinger: Beverlyn Roux Treating Mathan Darroch/Extender: Tito Dine in Treatment: 77 Wound Status Wound Number: 7 Primary Diabetic Wound/Ulcer of the Lower Extremity Etiology: Wound Location: Right  Metatarsal head first - Dorsal Wound Open Wounding Event: Gradually Appeared Status: Date Acquired: 08/06/2013 Comorbid Cataracts, Chronic sinus problems/congestion, Weeks Of Treatment: 77 History: Congestive Heart Failure, Hypertension, Type II Clustered Wound: No Diabetes Photos Photo Uploaded By: Roger Shelter on 04/29/2018 16:17:52 Wound Measurements Length: (cm) 2 Width: (cm) 0.8 Depth: (cm) 0.3 Area: (cm) 1.257 Volume: (cm) 0.377 % Reduction in Area: -6.7% % Reduction in Volume: -6.8% Epithelialization: None Tunneling: No Undermining: No Wound Description Classification: Grade 2 Wound Margin: Distinct, outline attached Exudate Amount: Large Exudate Type: Serous Exudate Color: amber Foul Odor After Cleansing: No Slough/Fibrino Yes Wound Bed Granulation Amount: Small (1-33%) Exposed Structure Granulation Quality: Pink Fascia Exposed: No Necrotic Amount: Large (67-100%) Fat Layer (Subcutaneous Tissue) Exposed: No Necrotic Quality: Adherent Slough Tendon Exposed: No Muscle Exposed: No Joint Exposed: No Bone Exposed: No Periwound Skin Texture Hoar, Nicle V. ([id number]) Texture Color No Abnormalities Noted: No No Abnormalities Noted: No Moisture Temperature / Pain No Abnormalities Noted: No Temperature: No Abnormality Maceration: Yes Tenderness on Palpation: Yes Wound Preparation Ulcer Cleansing: Rinsed/Irrigated with Saline Topical Anesthetic Applied: Other: lidocaine 4%, Treatment Notes Wound #7 (Right, Dorsal Metatarsal head first) 1. Cleansed with: Clean wound with Normal Saline 2. Anesthetic Topical Lidocaine 4% cream to wound bed prior to debridement 3. Peri-wound Care: Moisturizing lotion 4. Dressing Applied: Prisma Ag 5. Secondary Dressing Applied ABD Pad Dry Gauze Kerlix/Conform 7. Secured with Tape Notes darko shoe, heel cup Electronic Signature(s) Signed: 04/29/2018 4:06:29 PM By: Roger Shelter Entered By: Roger Shelter  on 04/29/2018 11:14:19 Miyasato, Cait V. ([id number]) -------------------------------------------------------------------------------- Vitals Details Patient Name: Zetino, Ethan V. Date of Service: 04/29/2018 11:00 AM Medical Record Number: [id number] Patient Account Number: [id number] Date of Birth/Sex: April 05, 1941 (77 y.o. F) Treating RN: Roger Shelter Primary Care Shareeka Yim: Beverlyn Roux Other Clinician: Referring Iyonna Rish: Beverlyn Roux Treating Keena Heesch/Extender: Ricard Dillon  Weeks in Treatment: 77 Vital Signs Time Taken: 11:06 Temperature (F): 98.1 Height (in): 64 Pulse (bpm): 72 Weight (lbs): 200 Respiratory Rate (breaths/min): 18 Body Mass Index (BMI): 34.3 Blood Pressure (mmHg): 112/64 Reference Range: 80 - 120 mg / dl Electronic Signature(s) Signed: 04/29/2018 4:06:29 PM By: Roger Shelter Entered By: Roger Shelter on 04/29/2018 11:06:54

## 2018-05-13 ENCOUNTER — Encounter: Payer: No Typology Code available for payment source | Attending: Internal Medicine | Admitting: Internal Medicine

## 2018-05-13 DIAGNOSIS — J449 Chronic obstructive pulmonary disease, unspecified: Secondary | ICD-10-CM | POA: Diagnosis not present

## 2018-05-13 DIAGNOSIS — F05 Delirium due to known physiological condition: Secondary | ICD-10-CM | POA: Diagnosis not present

## 2018-05-13 DIAGNOSIS — Z9981 Dependence on supplemental oxygen: Secondary | ICD-10-CM | POA: Diagnosis not present

## 2018-05-13 DIAGNOSIS — L97519 Non-pressure chronic ulcer of other part of right foot with unspecified severity: Secondary | ICD-10-CM | POA: Diagnosis not present

## 2018-05-13 DIAGNOSIS — G4730 Sleep apnea, unspecified: Secondary | ICD-10-CM | POA: Insufficient documentation

## 2018-05-13 DIAGNOSIS — E1151 Type 2 diabetes mellitus with diabetic peripheral angiopathy without gangrene: Secondary | ICD-10-CM | POA: Insufficient documentation

## 2018-05-13 DIAGNOSIS — Z794 Long term (current) use of insulin: Secondary | ICD-10-CM | POA: Diagnosis not present

## 2018-05-13 DIAGNOSIS — E11621 Type 2 diabetes mellitus with foot ulcer: Secondary | ICD-10-CM | POA: Insufficient documentation

## 2018-05-16 NOTE — Progress Notes (Signed)
[name] [name] ([id number]) Visit Report for 05/13/2018 Arrival Information Details Patient Name: [name] [name] LAL. Date of Service: 05/13/2018 11:00 AM Medical Record Number: [id number] Patient Account Number: [id number] Date of Birth/Sex: January 28, 1941 (77 y.o. F) Treating RN: [name] [name] Primary Care [name] [name]: [name] [name] Other Clinician: Referring [name] [name]: [name] [name] Treating Tuff Clabo/Extender: [name] [name] in Treatment: 40 Visit Information History Since Last Visit Added or deleted any medications: No Patient Arrived: Wheel Chair Any new allergies or adverse reactions: No Arrival Time: 11:28 Had a fall or experienced change in No activities of daily living that may affect Accompanied By: husband risk of falls: Transfer Assistance: Other Signs or symptoms of abuse/neglect since last visito No Patient Identification Verified: Yes Hospitalized since last visit: No Secondary Verification Process Completed: Yes Implantable device outside of the clinic excluding No Patient Requires Transmission-Based No cellular tissue based products placed in the center Precautions: since last visit: Patient Has Alerts: No Has Dressing in Place as Prescribed: Yes Pain Present Now: No Notes stayed in wheel chair. Electronic Signature(s) Signed: 05/13/2018 11:44:54 AM By: [name] [name] Entered By: [name] [name] on 05/13/2018 11:29:41 [name] [name] ([id number]) -------------------------------------------------------------------------------- Encounter Discharge Information Details Patient Name: [name] [name] [name]. Date of Service: 05/13/2018 11:00 AM Medical Record Number: [id number] Patient Account Number: [id number] Date of Birth/Sex: September 26, 1940 (78 y.o. F) Treating RN: [name] [name] Primary Care Jamont Mellin: [name] [name] Other Clinician: Referring Caidyn [name]: [name] [name] Treating Leandrew Keech/Extender: [name] [name] in Treatment: 65 Encounter Discharge Information Items Discharge Condition:  Stable Ambulatory Status: Ambulatory Discharge Destination: Home Transportation: Private Auto Schedule Follow-up Appointment: No Clinical Summary of Care: Post Procedure Vitals: Temperature (F): 98.5 Pulse (bpm): 82 Respiratory Rate (breaths/min): 18 Blood Pressure (mmHg): 137/54 Electronic Signature(s) Signed: 05/13/2018 5:34:51 PM By: [name] Cool, BSN, RN, CWS, Kim RN, BSN Entered By: [name] [name] on 05/13/2018 12:00:04 [name] [name] ([id number]) -------------------------------------------------------------------------------- Lower Extremity Assessment Details Patient Name: [name] [name] [name]. Date of Service: 05/13/2018 11:00 AM Medical Record Number: [id number] Patient Account Number: [id number] Date of Birth/Sex: November 16, 1940 (77 y.o. F) Treating RN: [name] [name] Primary Care Myrel Rappleye: [name] [name] Other Clinician: Referring Amalio [name]: [name] [name] Treating [name] [name]: [name] [name] in Treatment: 79 Edema Assessment Assessed: [Left: No] [Right: No] [Left: Edema] [Right: :] Calf Left: Right: Point of Measurement: 35 cm From Medial Instep cm cm Ankle Left: Right: Point of Measurement: 12 cm From Medial Instep cm cm Vascular Assessment Claudication: Claudication Assessment [Right:None] Pulses: Dorsalis Pedis Palpable: [Right:Yes] Posterior Tibial Extremity colors, hair growth, and conditions: Extremity Color: [Right:Hyperpigmented] Hair Growth on Extremity: [Right:No] Temperature of Extremity: [Right:Warm] Capillary Refill: [Right:> 3 seconds] Toe Nail Assessment Left: Right: Thick: Yes Discolored: Yes Deformed: Yes Improper Length and Hygiene: No Electronic Signature(s) Signed: 05/13/2018 11:44:54 AM By: [name] [name] Entered By: [name] [name] on 05/13/2018 11:34:35 [name] [name]. ([id number]) -------------------------------------------------------------------------------- Multi Wound Chart Details Patient Name: [name] [name] [name]. Date of Service:  05/13/2018 11:00 AM Medical Record Number: [id number] Patient Account Number: [id number] Date of Birth/Sex: 1941-05-20 (77 y.o. F) Treating RN: [name] [name] Primary Care Malynda Smolinski: [name] [name] Other Clinician: Referring [name] [name]: [name] [name] Treating Esiah Bazinet/Extender: [name] [name] in Treatment: 36 Vital Signs Height(in): 64 Pulse(bpm): 82 Weight(lbs): 200 Blood Pressure(mmHg): 137/54 Body Mass Index(BMI): 34 Temperature(F): 98.5 Respiratory Rate 18 (breaths/min): Photos: [N/A:N/A] Wound Location: Right Metatarsal head first - N/A N/A Dorsal Wounding Event: Gradually Appeared N/A N/A Primary Etiology: Diabetic Wound/Ulcer of the N/A N/A Lower Extremity Comorbid History: Cataracts, Chronic sinus N/A N/A problems/congestion,  Congestive Heart Failure, Hypertension, Type II Diabetes Date Acquired: 08/06/2013 N/A N/A Weeks of Treatment: 60 N/A N/A Wound Status: Open N/A N/A Measurements L x W x D 0.9x0.5x0.2 N/A N/A (cm) Area (cm) : 0.353 N/A N/A Volume (cm) : 0.071 N/A N/A % Reduction in Area: 70.00% N/A N/A % Reduction in Volume: 79.90% N/A N/A Classification: Grade 2 N/A N/A Exudate Amount: Small N/A N/A Exudate Type: Serous N/A N/A Exudate Color: amber N/A N/A Wound Margin: Distinct, outline attached N/A N/A Granulation Amount: None Present (0%) N/A N/A Necrotic Amount: Small (1-33%) N/A N/A Exposed Structures: Fascia: No N/A N/A Fat Layer (Subcutaneous Tissue) Exposed: No Tendon: No [name] [name]. ([id number]) Muscle: No Joint: No Bone: No Epithelialization: None N/A N/A Debridement: Debridement - Selective/Open N/A N/A Wound Pre-procedure 11:50 N/A N/A Verification/Time Out Taken: Pain Control: Other N/A N/A Tissue Debrided: Slough N/A N/A Level: Non-Viable Tissue N/A N/A Debridement Area (sq cm): 0.45 N/A N/A Instrument: Curette N/A N/A Bleeding: None N/A N/A Debridement Treatment Procedure was tolerated well N/A N/A Response: Post  Debridement 0.9x0.5x0.1 N/A N/A Measurements L x W x D (cm) Post Debridement Volume: 0.035 N/A N/A (cm) Periwound Skin Texture: No Abnormalities Noted N/A N/A Periwound Skin Moisture: Maceration: Yes N/A N/A Periwound Skin Color: No Abnormalities Noted N/A N/A Temperature: No Abnormality N/A N/A Tenderness on Palpation: Yes N/A N/A Wound Preparation: Ulcer Cleansing: N/A N/A Rinsed/Irrigated with Saline Topical Anesthetic Applied: Other: lidocaine 4% Procedures Performed: Debridement N/A N/A Treatment Notes Wound #7 (Right, Dorsal Metatarsal head first) 1. Cleansed with: Clean wound with Normal Saline 2. Anesthetic Topical Lidocaine 4% cream to wound bed prior to debridement 4. Dressing Applied: Prisma Ag 5. Secondary Dressing Applied Dry Gauze Kerlix/Conform Notes darko shoe, heel cup Electronic Signature(s) Signed: 05/13/2018 6:14:59 PM By: Linton Ham MD Entered By: Linton Ham on 05/13/2018 13:06:04 Christo, Novi [name] ([id number]) -------------------------------------------------------------------------------- Multi-Disciplinary Care Plan Details Patient Name: [name] [name] [name]. Date of Service: 05/13/2018 11:00 AM Medical Record Number: [id number] Patient Account Number: [id number] Date of Birth/Sex: 1941/03/05 (77 y.o. F) Treating RN: [name] [name] Primary Care Leonie Amacher: [name] [name] Other Clinician: Referring Macala Baldonado: [name] [name] Treating Bostyn Kunkler/Extender: [name] [name] in Treatment: 40 Active Inactive ` Abuse / Safety / Falls / Self Care Management Nursing Diagnoses: Impaired physical mobility Potential for falls Goals: Patient will remain injury free Date Initiated: 11/06/2016 Target Resolution Date: 12/30/2016 Goal Status: Active Patient/caregiver will verbalize understanding of skin care regimen Date Initiated: 11/06/2016 Target Resolution Date: 12/30/2016 Goal Status: Active Interventions: Assess fall risk on admission and as  needed Treatment Activities: Patient referred to home care : 11/06/2016 Notes: ` Nutrition Nursing Diagnoses: Potential for alteratiion in Nutrition/Potential for imbalanced nutrition Goals: Patient/caregiver verbalizes understanding of need to maintain therapeutic glucose control per primary care physician Date Initiated: 11/06/2016 Target Resolution Date: 12/30/2016 Goal Status: Active Interventions: Provide education on elevated blood sugars and impact on wound healing Notes: ` Orientation to the Wound Care Program Nursing Diagnoses: Knowledge deficit related to the wound healing center program GRETTELL, RANSDELL ([id number]) Goals: Patient/caregiver will verbalize understanding of the [place] Date Initiated: 11/06/2016 Target Resolution Date: 12/30/2016 Goal Status: Active Interventions: Provide education on orientation to the wound center Notes: ` Venous Leg Ulcer Nursing Diagnoses: Actual venous Insuffiency (use after diagnosis is confirmed) Knowledge deficit related to disease process and management Goals: Non-invasive venous studies are completed as ordered Date Initiated: 11/06/2016 Target Resolution Date: 12/30/2016 Goal Status: Active Patient/caregiver will verbalize understanding of disease process  and disease management Date Initiated: 11/06/2016 Target Resolution Date: 12/30/2016 Goal Status: Active Interventions: Assess peripheral edema status every visit. Notes: ` Wound/Skin Impairment Nursing Diagnoses: Impaired tissue integrity Knowledge deficit related to smoking impact on wound healing Knowledge deficit related to ulceration/compromised skin integrity Goals: Ulcer/skin breakdown will heal within 14 weeks Date Initiated: 11/06/2016 Target Resolution Date: 02/05/2017 Goal Status: Active Interventions: Assess ulceration(s) every visit Treatment Activities: Skin care regimen initiated : 11/06/2016 Notes: Electronic Signature(s) Signed:  05/13/2018 5:34:51 PM By: [name] Cool, BSN, RN, CWS, Kim RN, BSN [name] [name] [name]. ([id number]) Entered By: [name] [name] on 05/13/2018 11:48:17 [name] [name] [name] ([id number]) -------------------------------------------------------------------------------- Pain Assessment Details Patient Name: Gorby, Alahia [name]. Date of Service: 05/13/2018 11:00 AM Medical Record Number: [id number] Patient Account Number: [id number] Date of Birth/Sex: August 02, 1941 (77 y.o. F) Treating RN: [name] [name] Primary Care Deago Burruss: [name] [name] Other Clinician: Referring Veyda Kaufman: [name] [name] Treating Peighton Edgin/Extender: [name] [name] in Treatment: 71 Active Problems Location of Pain Severity and Description of Pain Patient Has Paino Patient Unable to Respond Site Locations Pain Management and Medication Current Pain Management: Electronic Signature(s) Signed: 05/13/2018 5:34:51 PM By: [name] Cool, BSN, RN, CWS, Kim RN, BSN Entered By: [name] [name] on 05/13/2018 11:47:46 Lavoy, Carrolyn [name] ([id number]) -------------------------------------------------------------------------------- Patient/Caregiver Education Details Patient Name: [name] [name] [name]. Date of Service: 05/13/2018 11:00 AM Medical Record Number: [id number] Patient Account Number: [id number] Date of Birth/Gender: 1940-12-25 (77 y.o. F) Treating RN: [name] [name] Primary Care Physician: [name] [name] Other Clinician: Referring Physician: Beverlyn [name] Treating Physician/Extender: [name] [name] in Treatment: 47 Education Assessment Education Provided To: Caregiver Education Topics Provided Wound/Skin Impairment: Handouts: Caring for Your Ulcer Methods: Demonstration, Explain/Verbal Responses: State content correctly Electronic Signature(s) Signed: 05/13/2018 5:34:51 PM By: [name] Cool, BSN, RN, CWS, Kim RN, BSN Entered By: [name] [name] on 05/13/2018 12:00:09 [name] [name] [name]  ([id number]) -------------------------------------------------------------------------------- Wound Assessment Details Patient Name: [name] [name] [name]. Date of Service: 05/13/2018 11:00 AM Medical Record Number: [id number] Patient Account Number: [id number] Date of Birth/Sex: Feb 22, 1941 (77 y.o. F) Treating RN: [name] [name] Primary Care Lorren Splawn: [name] [name] Other Clinician: Referring Kennth Vanbenschoten: [name] [name] Treating Porscha Axley/Extender: [name] [name] in Treatment: 79 Wound Status Wound Number: 7 Primary Diabetic Wound/Ulcer of the Lower Extremity Etiology: Wound Location: Right Metatarsal head first - Dorsal Wound Open Wounding Event: Gradually Appeared Status: Date Acquired: 08/06/2013 Comorbid Cataracts, Chronic sinus problems/congestion, Weeks Of Treatment: 79 History: Congestive Heart Failure, Hypertension, Type II Clustered Wound: No Diabetes Photos Photo Uploaded By: [name] [name] on 05/13/2018 11:42:51 Wound Measurements Length: (cm) 0.9 Width: (cm) 0.5 Depth: (cm) 0.2 Area: (cm) 0.353 Volume: (cm) 0.071 % Reduction in Area: 70% % Reduction in Volume: 79.9% Epithelialization: None Tunneling: No Undermining: No Wound Description Classification: Grade 2 Wound Margin: Distinct, outline attached Exudate Amount: Small Exudate Type: Serous Exudate Color: amber Foul Odor After Cleansing: No Slough/Fibrino Yes Wound Bed Granulation Amount: None Present (0%) Exposed Structure Necrotic Amount: Small (1-33%) Fascia Exposed: No Necrotic Quality: Adherent Slough Fat Layer (Subcutaneous Tissue) Exposed: No Tendon Exposed: No Muscle Exposed: No Joint Exposed: No Bone Exposed: No Periwound Skin Texture [name] [name] [name]. ([id number]) Texture Color No Abnormalities Noted: Yes No Abnormalities Noted: Yes Moisture Temperature / Pain No Abnormalities Noted: No Temperature: No Abnormality Maceration: Yes Tenderness on Palpation: Yes Wound Preparation Ulcer Cleansing:  Rinsed/Irrigated with Saline Topical Anesthetic Applied: Other: lidocaine 4%, Treatment Notes Wound #7 (Right, Dorsal Metatarsal head first) 1. Cleansed with: Clean wound with Normal Saline  2. Anesthetic Topical Lidocaine 4% cream to wound bed prior to debridement 4. Dressing Applied: Prisma Ag 5. Secondary Dressing Applied Dry Gauze Kerlix/Conform Notes darko shoe, heel cup Electronic Signature(s) Signed: 05/13/2018 11:44:54 AM By: [name] [name] Entered By: [name] [name] on 05/13/2018 11:33:54 [name] [name] [name]. ([id number]) -------------------------------------------------------------------------------- Vitals Details Patient Name: Seelinger, [name] [name]. Date of Service: 05/13/2018 11:00 AM Medical Record Number: [id number] Patient Account Number: [id number] Date of Birth/Sex: 08/01/1941 (77 y.o. F) Treating RN: [name] [name] Primary Care Chenoah Mcnally: [name] [name] Other Clinician: Referring Jennilee Demarco: [name] [name] Treating Kazoua Gossen/Extender: [name] [name] in Treatment: 66 Vital Signs Time Taken: 11:29 Temperature (F): 98.5 Height (in): 64 Pulse (bpm): 82 Weight (lbs): 200 Respiratory Rate (breaths/min): 18 Body Mass Index (BMI): 34.3 Blood Pressure (mmHg): 137/54 Reference Range: 80 - 120 mg / dl Pulse Oximetry (%): 96 Inhaled Oxygen Concentration (%): 2 Electronic Signature(s) Signed: 05/13/2018 11:44:54 AM By: [name] [name] Entered By: [name] [name] on 05/13/2018 11:30:56

## 2018-05-16 NOTE — Progress Notes (Signed)
[name], [name] ([id number]) Visit Report for 05/13/2018 Debridement Details Patient Name: [name], [name] Date of Service: 05/13/2018 11:00 AM Medical Record Number: [id number] Patient Account Number: [id number] Date of Birth/Sex: 07-15-1941 (77 y.o. F) Treating RN: Cornell Barman Primary Care Provider: Beverlyn Roux Other Clinician: Referring Provider: Beverlyn Roux Treating Provider/Extender: Tito Dine in Treatment: 79 Debridement Performed for Wound #7 Right,Dorsal Metatarsal head first Assessment: Performed By: Physician Ricard Dillon, MD Debridement Type: Debridement Severity of Tissue Pre Fat layer exposed Debridement: Level of Consciousness (Pre- Responds to Verbal Stimuli procedure): Pre-procedure Verification/Time Yes - 11:50 Out Taken: Start Time: 11:50 Pain Control: Other : lidocaine 4% Total Area Debrided (L x W): 0.9 (cm) x 0.5 (cm) = 0.45 (cm) Tissue and other material Non-Viable, Slough, Slough debrided: Level: Non-Viable Tissue Debridement Description: Selective/Open Wound Instrument: Curette Bleeding: None End Time: 11:52 Response to Treatment: Procedure was tolerated well Level of Consciousness Responds to Verbal Stimuli (Post-procedure): Post Debridement Measurements of Total Wound Length: (cm) 0.9 Width: (cm) 0.5 Depth: (cm) 0.1 Volume: (cm) 0.035 Character of Wound/Ulcer Post Debridement: Requires Further Debridement Severity of Tissue Post Debridement: Fat layer exposed Post Procedure Diagnosis Same as Pre-procedure Electronic Signature(s) Signed: 05/13/2018 5:34:51 PM By: Gretta Cool, BSN, RN, CWS, Kim RN, BSN Signed: 05/13/2018 6:14:59 PM By: Linton Ham MD Entered By: Linton Ham on 05/13/2018 13:06:48 [name] ([id number]) -------------------------------------------------------------------------------- HPI Details Patient Name: [name], [name] Date of Service: 05/13/2018 11:00 AM Medical Record Number:  [id number] Patient Account Number: [id number] Date of Birth/Sex: Feb 06, 1941 (77 y.o. F) Treating RN: Cornell Barman Primary Care Provider: Beverlyn Roux Other Clinician: Referring Provider: Beverlyn Roux Treating Provider/Extender: Tito Dine in Treatment: 62 History of Present Illness HPI Description: 08/13/16: This is a 77 year old woman who came predominantly for review of 3 cm in diameter circular wound to the left anterior lateral leg. She was in the ER on 08/01/16 I reviewed their notes. There was apparently pus coming out of the wound at that time and the patient arrived requesting debridement which they don't do in the emergency room. Nevertheless I can't see that they did any x-rays. There were no cultures done. She is a type II diabetic and I a note after the patient was in the clinic that she had a bypass graft from the popliteal to the tibial on the right on 02/28/16. She also had a right greater saphenous vein harvest on the same date for arterial bypass. She is going to have vascular studies including ABIs T ABIs on the right on 08/28/16. The patient's surgery was on 02/28/16 by Dr. Vallarie Mare she had a right below the knee popliteal artery to peroneal artery bypass with reverse greater saphenous vein and an endarterectomy of the mid segment peroneal artery. Postoperatively she had a strong mild monophasic peroneal signal with a pink foot. It would appear that the patient is had some nonhealing in the surgical saphenous vein harvest site on the left leg. Surprisingly looking through [facility] I cannot see much information about this at all. Dr. Lucious Groves notes from 05/29/16 show that the patient's wounds "are not healed" the right first metatarsal wound healed but then opened back up. 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 At that point the patient had a wound VAC to her right  calf with regards to the wounds on her dorsal right toe would appear that these are felt to be  arterial wounds. She has had surgery on the metatarsal phalangeal in 2015 I Dr. Doran Durand secondary to a right metatarsal phalangeal joint fracture. She is apparently had discoloration around this area since then. 08/28/16; patient arrives with her wounds in much the same condition. The linear vein harvest site and the circular wound below it which I think was a blister. She also has to probing holes in her right great toe and a necrotic eschar on the right second toe. Because of these being arterial wounds I reduced her compression from 3-2 layers this seems to of done satisfactorily she has not had any problems. I cannot see that she is actually had an x-ray ====== 11/06/16 the patient comes in for evaluation of her right lower extremity ulcers. She was here in January 2018 for 2 visits subsequently ended up in the hospital with pneumonia and then to rehabilitation. She has now been discharged from rehabilitation and is home. She has multiple ulcerations to her right lower extremity including the foot and toes. She does have home health in place and they have been placing alginate to the ulcers. She is followed by Dr. Bridgett Larsson of vascular medicine. 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 She recently saw him on 3/23. In office ABIs were: Right 0.48 with monophasic flow to the DP, PT, peroneal Left 0.63 with monophasic flow to the DP and PT Her arterial studies indicated a patent right below knee popliteal to peroneal bypass She had an MRI in February 2008 that was negative frosty myelitis but this showed general soft tissue edema in the right foot and lower extremity concerning for cellulitis She is a diabetic, managed with insulin. Her hemoglobin A1c in December 2017 was 8.4 which is a trend up from previous levels. She had blood work in  February 2018 which revealed an albumin of 2.6 this appears to be relatively acute as an albumin in November 2017 was 3.7 11/13/16; this is a patient I have not seen since February who is readmitted to our clinic last week. 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 Bridgett Larsson. I have reviewed Dr. Lianne Moris notes from March/23/18. Doppler ABI on that date showed an ABI on the right of 0.48 and on the left of 0.63. Dorsalis pedis waveforms were monophasic bilaterally. There was no waveforms detected at the posterior tibial on the right, monophasic on the left. Dr. Lianne Moris comments were that this patient would have follow-up vascular studies in 3 months including ABIs and right lower extremity arterial duplex. She had an MRI in February that was negative for osteomyelitis but showed generalized edema in Holmes, Rajanae V. ([id number]) the foot. Last albumin I see was in January at 3.4 we have been using Santyl to the 4 wounds on the right leg the patient is noted today to have widespread edema well up towards her groin this is pitting 2-3+. 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 Normal ejection fraction. 11/20/16; patient has a follow-up appointment with Dr. Bridgett Larsson on April 23. She is still complaining of a lot of pain in the right foot and right leg. It is not clear to me that this is at all positional however I think it is clear claudication with minimal activity perhaps at rest. At our suggestion she is return to her primary physician's  office tomorrow with regards to her pitting lower extremity bilateral edema that I reviewed in detail last week 11/27/16; the follow-up with Dr. Bridgett Larsson was actually on May 23 on April 23 as I stated in my note last week. N/A case all of her wounds seems somewhat smaller. The 2 on the right leg are definitely smaller. The areas on the dorsal right first toe,  right third toe and the lateral part of the right fifth metatarsal head all looks smaller but have tightly adherent surfaces. We have been using Santyl 12/04/16; follow-up with Dr. Bridgett Larsson on May 23. 2 small open wounds on the right leg continue to get smaller. The area on the right third total lateral aspect of the right fifth toe also look better. The remaining area on the dorsal first toe still has some depth to it. We have been using Santyl to the toes and collagen on the right 12/11/16; according to patient's husband the follow-up with Dr. Bridgett Larsson is not until July. All of the wounds on the right leg are measuring smaller. We have been using some combination of Prisma and Santyl although I think we can go to straight Prisma today. There may have been some confusion with home health about the primary dressing orders here. 12/25/16; the patient has had some healing this week. The area on her right lateral fifth metatarsal head, right third toe are both healed lower right leg is healed. In the vein harvest site superiorly she has one superficial open area. On the dorsal aspect of her right great toe/MTP joint the wound is now divided into 2 however the proximal area is deep and there is palpable bone 01/01/17; still an open area in the middle of her original right surgical scar. The area on the right third toe and right lateral fifth metatarsal head remained closed. Problematic area on the dorsal aspect of the toe. Previous surgery in this area line 01/08/17 small open area on the original scar on her upper anterior leg although this is closing. X-ray I did of the right first toe did not show underlying bony abnormality. Still this area on the dorsal first toe probes to bone. We have been using Prisma 01/15/17; small open area in the original scar in the upper anterior leg is almost fully closed. She has 2 open areas over the dorsal aspect of the right first toe that probes to bone. Used and a form starting last  week. Vigorous bone scraping that I did last week showed few methicillin sensitive staph aureus. She is allergic to penicillin and sulfa drugs. I'm going to give her 2 weeks of doxycycline. She will need an MRI with contrast. 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 She continues on doxycycline for a bone scraping that showed methicillin sensitive staph aureus [allergic to penicillin and sulfa]. We have been using Endo form to the wound. 01/29/17; surprisingly her MRI did not show osteomyelitis. She continues on doxycycline for a bone scraping that showed methicillin sensitive staph aureus with allergies to penicillin and sulfa. We have been using Endo form to the wound. Unfortunately I cannot get a surface on this visit looks like it is able to support a healing state. Proximally there is still exposed bone. There is no overt soft tissue tenderness. Her MRI did show a previous fracture was surgery to this area but no hardware 02/12/17 on evaluation today patient appears  to be doing well in regard to her lower extremity wound. She does have some mild discomfort but this is minimal. There's no evidence of infection. Her left great toe nail has somewhat been lifted up and there is a little bit of slight bleeding underneath but this is still firmly attached. 02/19/17; she ran out of doxycycline 2 days ago. Nevertheless I would like to continue this for 2 weeks to make a full 6 weeks of therapy as the bone scraping that I did from the open area on the dorsal right first toe showed MRSA. This should complete antibiotic therapy. 02/26/17; the patient is completing her 6 weeks of oral doxycycline. Deep wound on the dorsal right toe. This still has some exposed bone proximally. She does have a reasonably granulated surface albeit thin over bone. I've been using Endo form have made application for an amniotic skin sub 03/05/17; the  patient has completed 6 weeks of doxycycline. This is a deep wound on the dorsal right toe which has exposed bone proximally. She has granulation over most of this wound albeit a thin layer. I've been using Endo form. Still do not have approval for Affinity 03/13/17 on evaluation today patient's right foot wound appears to be doing better measurement wise compared to her last evaluation. She has been tolerating the dressing change without complication and does have a appointment with the dietary that has been made as far as referral is concerned there just waiting for her and transportation to contact them back for an actual date. Nonetheless patient has been having less pain at this point. No fevers, chills, nausea, or vomiting noted at this time. 03/26/17; linear wound over the dorsal aspect of her right great toe. At one point this had exposed bone on the most superior aspect however I am pleased to see today that this appears to have a surface of granulation. Apparently product was applied for but denied by [facility]. We'll need to see what that was. The patient has known PAD status post revascularization. MRI did not show osteomyelitis which was done in June 04/02/17; continued improvement using Endoform. She was approved for [employer] but hasn't over $818 co-pay per application, [name], [name] ([id number]) this is beyond her means 04/09/17; continues on Endoform. 04/16/17; appears to be doing nicely continuing on Endoform 04/22/17; patient did not have her dressing changed all week because of the weather. Using Endoform. Base of the wound looks healthy elbow there appears to events surrounding maceration perhaps because of the drainage was not changing the wound dressing 04/30/17 wound today continues to close and except for a small divot on the superior part of the wound. This area did not probe the bone but I found this a little concerning as this was the area with exposed bone before we be able to get  this to granulate forward. As I remember things she is not a candidate for skin substitutes secondary to an outlandish co-pay. I asked her husband to look into the out of pocket max for medications if he has 1 [Regranex] or totally for skin substitutes 05/07/17; the patient arrives today with a wound roughly the same size although under careful inspection under the light there appears to be some epithelialization. Her intake nurse noted that the Endoform seemed to be placed over the wound rather than in the deeper divots they could really benefit from the Endoform. At this point I have no plans to change the Endoform as at one point this was at least  33% exposed bone and the rest of the wound very close to the underlying phalanx 05/14/17; no major change in the size or appearance of the wound. We have been using Endoform for a prolonged period of time with reasonably good improvement in the epithelialization i.e. no exposed bone especially proximally. Unless we've not made a lot of changes in the last several weeks. She does not complain of pain. She was revascularized early this year or perhaps late last year by Dr. Bridgett Larsson and I wonder if he will need to have a look at her, I will need to review the actual vascular procedure which I think was a distal popliteal bypass 05/21/17; switched to either for a blue last week. Patient arrived in clinic with the wound not measuring any better but the surface looking some better. Unfortunately she had some surface slough and when I went to remove this superiorly she had exposed bone. Previously she had had exposed bone in the inferior part of the wound however this is granulated over some weeks ago. Clearly this is a major step back for her. With some difficulty I was able to obtain a piece of the bone probing through the superior part of the wound for culture. We did not send pathology. It is likely that she is going to need imaging of this site but I did not  order this today 05/28/17; bone culture I took last week showed MSSA. She still has exposed bone however I could not get another piece for pathology. She had an MRI 4 months ago that did not show osteoarthritic at time. Wound rapidly deteriorated superiorly and I suspect this is underlying osteomyelitis. We have been using Hydrofera Blue 06/04/17 on evaluation today patient's wound appears to actually be doing a little bit better visually compared to last week's evaluation which is good news. Nonetheless she does have osteomyelitis of the toe which does not appear to be MRSA. No fevers, chills, nausea, or vomiting noted at this time. She is having no discomfort. 06/11/17; patient's wound looks a little healthier than I remember seeing it 2 weeks ago. There is no exposed bone and the surface of the wound appears well granulated. We've been using hydrofera blue. I note that she was started on doxycycline which was MSSA. Her appointment with Dr. Ola Spurr is on November 12 I believe. 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 Vallarie Mare of vascular surgery on 11/9. He offered right leg angiography possible intervention however the patient refused. I've discussed this with the patient's husband today she did not want any further procedures. Also noteworthy that Dr. Vallarie Mare stated that this wound had not healed in 3 years, I was not aware of this degree of chronicity in this area. She has underlying osteomyelitis by bone culture. Culture of this grew MSSA, she is allergic to penicillin and therefore not a candidate for beta lactams. She saw Dr. Ola Spurr of infectious disease this morning, he continued her on doxycycline for another month and apparently sent in a prescription. We have been using Hydrofera Blue. ABI's update by Dr. Bridgett Larsson right 0.62, left 0.89. Monophasic wave forms 06/25/17 on evaluation today patient appears to be doing well in regard to her right great toe wound. She  is still taking the doxycycline as prescribed by Dr. Ola Spurr. The Hydrofera Blue Dressing's to be doing as well as anything has for her up to this point and we are still waiting on an insurance authorization for the [place]. She is having  no pain which is good news. No fevers, chills, nausea, or vomiting noted at this time. 07/02/17; still taking doxycycline as prescribed by Dr. Ola Spurr. Hydrofera Blue continues. We have no palpable bone. Still have not had had approval of Apligraf 08/06/16; the patient arrives today with [facility] even though we apparently had changed to Sorbact last time. Her co-pay for Regranex is $389 in discussion with the patient and her husband this was excessive. We'll continue with the Sorbact and standard dressings for now 08/20/17; we have been using sorbact. The wound bed still requires debridement. Wound measurements are slightly better. 09/03/17;using Sorbact.no debridement today. Wound measurements slightly better. There is some discoloration of the tip of her toelooks like bruising 09/17/17; using Sorbact. No debridement today. Wound dimensions are about the same. Still some discoloration at the tip of her great toe. This does not look any worse than last time 10/01/17; using sorbact right great toe I thought she might have surface epithelialization last time although it is certainly not [name], [name] ([id number]) look like that today nevertheless her dimensions are better. Continued surface eschar on the tip of her toe but this is not progressing. She is actually complaining of the left great toe 10/15/17; this is a very difficult area on the dorsal aspect of her proximal right great toe. At one point this wound had exposed bone however we have managed to get some degree of epithelialization. She was revascularized by Dr. Vallarie Mare in [place]. She saw Dr. Ola Spurr for underlying osteomyelitis. She is not a candidate for advanced treatment options  [insurance issues]. She comes in today with a new area on the tip of her right great toe. The exact history here is unclear. We have been using sorbact 10/29/17; patient arrives today with very significant bilateral increase edema which appears to extend into the thighs. This is tight and not particularly pitting however it looks a lot more impressive than I remember. Not surprisingly the wounds on her right great toe have increased in size with weeping edema fluid as the edema extends into the dorsal foot itself. She also has a wound on the tip of her right great toe which is a new wound from 2 weeks ago. We have been using sorbact. 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 He is on Demadex presumably twice a day at 20 mg. I have asked her family to make her an appointment with her primary physicians at the [place] [facility] to go over this degree of edema. This is causing I think deterioration in the right great toe wound. She also has significant PAD and is status post right leg bypass graft. I think this was done by [facility]. I believe they also said that there was nothing further that could be done with regards to her vascular status. 11/12/17; patient is going to see her primary doctor this afternoon with regards to lower extremity edema. She has 2 open wounds on the right great toe the original wound on the dorsal proximal part and then a more necrotic looking area on the tip of her right great toe. We took some time today to review her vascular status. The patient had a relate below knee popliteal to peroneal artery bypass with reversed greater saphenous vein on 06/13/17; she also had endarterectomy of the midsegment of the peroneal artery. The patient's course was complicated by a bilateral CVA and was found to have  near occlusion of the left internal carotid artery that required interventional  radiology stenting. I think at some point in time after that she was offered an arteriogram on the right but she declined. I've used Silver collagen to her wounds recently. 11/26/17; patient has 2 open wounds on the right great toe tip as well as the dorsal aspect of her right great toe. She has known PAD. I've been trying to get her back to see vein and vascular in [facility] to see if there are options for endovascular surgery to help with perfusion although the patient and her husband may not want any more surgery. We've been using silver alginate because of maceration. She does not have an option for advanced treatment/skin substitutes 12/10/17; continued open wounds much the same as 2 weeks ago on the dorsal aspect of the right great toe on the right great toe tip. I've encouraged to follow-up with Dr. Bridgett Larsson of vein and vascular to explore the possibility that there may be correctable ischemia involved in nonhealing these areas. We made considerable progress on the dorsal right great toe however it is stalled recently. We do not have an advanced treatment option. Originally this had exposed bone however there is a granulated base. 12/24/17; patient went back to see vein and vascular in [place]. She was seen by Manus Gunning NP. ABIs on the right were noted to be 0.84 and TBI of the right and 0.62. On the left her ABI was 0.82 and her TBI of 0.75. Waveforms were monophasic at the posterior tibial and dorsalis pedis bilaterally. 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 Her husband reiterates today that she does not want an arteriogram therefore I'm not going to press the issue. We've been using silver collagen to the wounds on the tip of the right great toe and dorsal surface of the right great toe. 01/07/18; patient  arrives today with the tip of her toe healed. An cerebral amount of slough over the wound on the dorsal toe. As noted on 12/24/17 no options currently for revascularization the patient will not allow an arteriogram 01/21/18; typical of her right great toe remains healed. Still large amount of slough over the wound on the dorsal toe. No options for revascularization. We've been using Medihoney 02/04/18; no major change. Still a punched out area on the right great toe dorsal wound. Covered in a difficult necrotic debris. 02/18/18; no major change. Still a punched out area over the right great toe. They're using Niarada 03/04/18; no major change. The wound is stalled. Still a punched out area over the right dorsal great toe. We've been using medihone for about a month not much change in the wound dimensions or the characteristics of the wound surface. We've previously used Iodoflex, Hydrofera Blue, sorbact, and I think at one point [place]. She has PAD and does not have any revascularization options although a wound distally on the tip of the great toe did close over 03/25/18; absolutely no change. Still adherent debris over the wound surface there is no palpable bone but the wound still has some depth. This is predominantly a arterial wound and she basically has refused further attempts at revascularization. I would consider this a totally palliative situation except for she managed to heal the wound at the tip of the same toe some months  ago. I changed her back to [place], [name] ([id number]) The other issue is that [facility] has called and wants to pare back on the visits which have been 3 times a week. They will drop to 2 times a week next week 04/29/18; since the patient was last here she was admitted to hospital from 9/6 through 9/13. She was noted to have altered LOC, declining ability to walk. 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 She was discharged to [facility]. She is on oxygen chronically at 3 L. I don't think they've been doing anything specific to the wound. Her husband laments that she is no longer able to stand and walk or transfer. Yet he seems determined to take her home 05/13/18; 2 week follow-up. The patient is still at [place]. She arrives lethargic in clinic. She was also confused during her index hospitalization. She was felt to have metabolic encephalopathy secondary to UTI. Although she also had hypercapnic respiratory failure secondary to COPD and sleep apnea. She is on chronic oxygen. Paradoxically her wound actually looks a lot better and this is really filled in nicely. We're using silver collagen Electronic Signature(s) Signed: 05/13/2018 6:14:59 PM By: Linton Ham MD Entered By: Linton Ham on 05/13/2018 13:08:22 [name], [name] ([id number]) -------------------------------------------------------------------------------- Physical Exam Details Patient Name: [name], [name] Date of Service: 05/13/2018 11:00 AM Medical Record Number: [id number] Patient Account Number: [id number] Date of Birth/Sex: Mar 01, 1941 (77 y.o. F) Treating RN: Cornell Barman Primary Care Provider: Beverlyn Roux Other Clinician: Referring Provider: Beverlyn Roux Treating Provider/Extender: Tito Dine in Treatment: 56 Constitutional Sitting or standing Blood Pressure is within target range for patient.. Pulse regular and within target range for patient.[name] Respirations regular, non-labored and within target range.. Temperature is normal and within the target range for the patient.[name] appears in no distress. Cardiovascular Pedal pulses are palpable faintly. Notes Would exam; the original wound on the dorsal aspect of her right great toe has closed and remarkably. Still some necrotic surface debris  removed #3 curet. Hemostasis with direct pressure. Resultant wound bed looks really quite good repair there is no evidence of surrounding infection. I also note that she has a lot less edema in the right leg probably reflecting the fact that she is either in bed with her leg up or in the chair with her leg up. Electronic Signature(s) Signed: 05/13/2018 6:14:59 PM By: Linton Ham MD Entered By: Linton Ham on 05/13/2018 13:09:39 [name] ([id number]) -------------------------------------------------------------------------------- Physician Orders Details Patient Name: [name], [name] Date of Service: 05/13/2018 11:00 AM Medical Record Number: [id number] Patient Account Number: [id number] Date of Birth/Sex: January 09, 1941 (77 y.o. F) Treating RN: Cornell Barman Primary Care Provider: Beverlyn Roux Other Clinician: Referring Provider: Beverlyn Roux Treating Provider/Extender: Tito Dine in Treatment: 70 Verbal / Phone Orders: No Diagnosis Coding Wound Cleansing Wound #7 Right,Dorsal Metatarsal head first o Cleanse wound with mild soap and water Anesthetic (add to Medication List) Wound #7 Right,Dorsal Metatarsal head first o Topical Lidocaine 4% cream applied to wound bed prior to debridement (In Clinic Only). Primary Wound Dressing Wound #7 Right,Dorsal Metatarsal head first o Silver Collagen Secondary Dressing Wound #7 Right,Dorsal Metatarsal head first o ABD and Kerlix/Conform Dressing Change Frequency Wound #7 Right,Dorsal Metatarsal head first o Change Dressing Monday, Wednesday, Friday Follow-up Appointments Wound #7 Right,Dorsal Metatarsal head first o Return  Appointment in 1 week. Edema Control Wound #7 Right,Dorsal Metatarsal head first o Patient to wear own compression stockings o Elevate legs to the level of the heart and pump ankles as often as possible Electronic Signature(s) Signed: 05/13/2018 5:34:51 PM By: Gretta Cool, BSN, RN, CWS, Kim RN,  BSN Signed: 05/13/2018 6:14:59 PM By: Linton Ham MD Entered By: Gretta Cool, BSN, RN, CWS, Kim on 05/13/2018 11:51:40 [name] ([id number]) -------------------------------------------------------------------------------- Problem List Details Patient Name: Bjelland, [place] V. Date of Service: 05/13/2018 11:00 AM Medical Record Number: [id number] Patient Account Number: [id number] Date of Birth/Sex: 08/07/1940 (77 y.o. 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 with 11/06/2016 No Yes unspecified severity E11.52 Type 2 diabetes mellitus with diabetic peripheral angiopathy 11/06/2016 No Yes with gangrene 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 ([id number]) -------------------------------------------------------------------------------- Progress Note Details Patient Name: Stanhope, Avaleen V. Date of Service: 05/13/2018 11:00 AM Medical Record Number: [id number] Patient Account  Number: [id number] Date of Birth/Sex: [birth year] (77 y.o. F) Treating RN: Cornell Barman Primary Care Provider: Beverlyn Roux Other Clinician: Referring Provider: Beverlyn Roux Treating Provider/Extender: Tito Dine in Treatment: 72 Subjective History of Present Illness (HPI) 08/13/16: This is a 77 year old woman who came predominantly for review of 3 cm in diameter circular wound to the left anterior lateral leg. She was in the ER on 08/01/16 I reviewed their notes. There was apparently pus coming out of the wound at that time and the patient arrived requesting debridement which they don't do in the emergency room. Nevertheless I can't see that they did any x-rays. There were no cultures done. She is a type II diabetic and I a note after the patient was in the clinic that she had a bypass graft from the popliteal to the tibial on the right on 02/28/16. She also had a right greater saphenous vein harvest on the same date for arterial bypass. She is going to have vascular studies including ABIs T ABIs on the right on 08/28/16. The patient's surgery was on 02/28/16 by Dr. Vallarie Mare she had a right below the knee popliteal artery to peroneal artery bypass with reverse greater saphenous vein and an endarterectomy of the mid segment peroneal artery. Postoperatively she had a strong mild monophasic peroneal signal with a pink foot. It would appear that the patient is had some nonhealing in the surgical saphenous vein harvest site on the left leg. Surprisingly looking through [facility] I cannot see much information about this at all. Dr. Lucious Groves notes from 05/29/16 show that the patient's wounds "are not healed" the right first metatarsal wound healed but then opened back up. 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 At that point the patient had a wound VAC to her right calf with regards to the  wounds on her dorsal right toe would appear that these are felt to be arterial wounds. She has had surgery on the metatarsal phalangeal in 2015 I Dr. Doran Durand secondary to a right metatarsal phalangeal joint fracture. She is apparently had discoloration around this area since then. 08/28/16; patient arrives with her wounds in much the same condition. The linear vein harvest site and the circular wound below it which I think was a blister. She also has to probing holes in her right great toe and a necrotic eschar on the right second toe. Because of these being arterial wounds I reduced her compression from 3-2 layers this seems to of done satisfactorily she has not had any problems. I cannot see that she is actually had an x-ray ====== 11/06/16 the patient comes in for evaluation of her right lower extremity ulcers. She was here in January 2018 for 2 visits subsequently ended up in the hospital with pneumonia and then to rehabilitation. She has now been discharged from rehabilitation and is home. She has multiple ulcerations to her right lower extremity including the foot and toes. She does have home health in place and they have been placing alginate to the ulcers. She is followed by Dr. Bridgett Larsson of vascular medicine. 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 She recently saw him on 3/23. In office ABIs were: Right 0.48 with monophasic flow to the DP, PT, peroneal Left 0.63 with monophasic flow to the DP and PT Her arterial studies indicated a patent right below knee popliteal to peroneal bypass She had an MRI in February 2008 that was negative frosty myelitis but this showed general soft tissue edema in the right foot and lower extremity concerning for cellulitis She is a diabetic, managed with insulin. Her hemoglobin A1c in December 2017 was 8.4 which is a trend up from previous levels. She had blood work in February 2018 which  revealed an albumin of 2.6 this appears to be relatively acute as an albumin in November 2017 was 3.7 11/13/16; this is a patient I have not seen since February who is readmitted to our clinic last week. 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 Bridgett Larsson. I have reviewed Dr. Lianne Moris notes from March/23/18. Doppler ABI on that date showed an ABI on the right of 0.48 and on the left of 0.63. Dorsalis pedis waveforms were monophasic bilaterally. There was no waveforms detected at the posterior tibial on the right, monophasic on the left. Dr. Lianne Moris comments were that this patient would have follow-up vascular studies in 3 months including ABIs and right lower [name], [name] ([id number]) extremity arterial duplex. She had an MRI in February that was negative for osteomyelitis but showed generalized edema in the foot. Last albumin I see was in January at 3.4 we have been using Santyl to the 4 wounds on the right leg the patient is noted today to have widespread edema well up towards her groin this is pitting 2-3+. 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 Normal ejection fraction. 11/20/16; patient has a follow-up appointment with Dr. Bridgett Larsson on April 23. She is still complaining of a lot  of pain in the right foot and right leg. It is not clear to me that this is at all positional however I think it is clear claudication with minimal activity perhaps at rest. At our suggestion she is return to her primary physician's office tomorrow with regards to her pitting lower extremity bilateral edema that I reviewed in detail last week 11/27/16; the follow-up with Dr. Bridgett Larsson was actually on May 23 on April 23 as I stated in my note last week. N/A case all of her wounds seems somewhat smaller. The 2 on the right leg are definitely smaller. The areas on the dorsal right first toe, right third toe  and the lateral part of the right fifth metatarsal head all looks smaller but have tightly adherent surfaces. We have been using Santyl 12/04/16; follow-up with Dr. Bridgett Larsson on May 23. 2 small open wounds on the right leg continue to get smaller. The area on the right third total lateral aspect of the right fifth toe also look better. The remaining area on the dorsal first toe still has some depth to it. We have been using Santyl to the toes and collagen on the right 12/11/16; according to patient's husband the follow-up with Dr. Bridgett Larsson is not until July. All of the wounds on the right leg are measuring smaller. We have been using some combination of Prisma and Santyl although I think we can go to straight Prisma today. There may have been some confusion with home health about the primary dressing orders here. 12/25/16; the patient has had some healing this week. The area on her right lateral fifth metatarsal head, right third toe are both healed lower right leg is healed. In the vein harvest site superiorly she has one superficial open area. On the dorsal aspect of her right great toe/MTP joint the wound is now divided into 2 however the proximal area is deep and there is palpable bone 01/01/17; still an open area in the middle of her original right surgical scar. The area on the right third toe and right lateral fifth metatarsal head remained closed. Problematic area on the dorsal aspect of the toe. Previous surgery in this area line 01/08/17 small open area on the original scar on her upper anterior leg although this is closing. X-ray I did of the right first toe did not show underlying bony abnormality. Still this area on the dorsal first toe probes to bone. We have been using Prisma 01/15/17; small open area in the original scar in the upper anterior leg is almost fully closed. She has 2 open areas over the dorsal aspect of the right first toe that probes to bone. Used and a form starting last week. Vigorous  bone scraping that I did last week showed few methicillin sensitive staph aureus. She is allergic to penicillin and sulfa drugs. I'm going to give her 2 weeks of doxycycline. She will need an MRI with contrast. 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 She continues on doxycycline for a bone scraping that showed methicillin sensitive staph aureus [allergic to penicillin and sulfa]. We have been using Endo form to the wound. 01/29/17; surprisingly her MRI did not show osteomyelitis. She continues on doxycycline for a bone scraping that showed methicillin sensitive staph aureus with allergies to penicillin and sulfa. We have been using Endo form to the wound. Unfortunately I cannot get a surface on this visit  looks like it is able to support a healing state. Proximally there is still exposed bone. There is no overt soft tissue tenderness. Her MRI did show a previous fracture was surgery to this area but no hardware 02/12/17 on evaluation today patient appears to be doing well in regard to her lower extremity wound. She does have some mild discomfort but this is minimal. There's no evidence of infection. Her left great toe nail has somewhat been lifted up and there is a little bit of slight bleeding underneath but this is still firmly attached. 02/19/17; she ran out of doxycycline 2 days ago. Nevertheless I would like to continue this for 2 weeks to make a full 6 weeks of therapy as the bone scraping that I did from the open area on the dorsal right first toe showed MRSA. This should complete antibiotic therapy. 02/26/17; the patient is completing her 6 weeks of oral doxycycline. Deep wound on the dorsal right toe. This still has some exposed bone proximally. She does have a reasonably granulated surface albeit thin over bone. I've been using Endo form have made application for an amniotic skin sub 03/05/17; the patient has  completed 6 weeks of doxycycline. This is a deep wound on the dorsal right toe which has exposed bone proximally. She has granulation over most of this wound albeit a thin layer. I've been using Endo form. Still do not have approval for Affinity 03/13/17 on evaluation today patient's right foot wound appears to be doing better measurement wise compared to her last evaluation. She has been tolerating the dressing change without complication and does have a appointment with the dietary that has been made as far as referral is concerned there just waiting for her and transportation to contact them back for an actual date. Nonetheless patient has been having less pain at this point. No fevers, chills, nausea, or vomiting noted at this time. 03/26/17; linear wound over the dorsal aspect of her right great toe. At one point this had exposed bone on the most superior aspect however I am pleased to see today that this appears to have a surface of granulation. Apparently product was applied for but denied by [facility]. We'll need to see what that was. The patient has known PAD status post revascularization. MRI did not show osteomyelitis which was done in June Nicotra, Tiwanda V. ([id number]) 04/02/17; continued improvement using Endoform. She was approved for Oasis but hasn't over $761 co-pay per application, this is beyond her means 04/09/17; continues on Endoform. 04/16/17; appears to be doing nicely continuing on Endoform 04/22/17; patient did not have her dressing changed all week because of the weather. Using Endoform. Base of the wound looks healthy elbow there appears to events surrounding maceration perhaps because of the drainage was not changing the wound dressing 04/30/17 wound today continues to close and except for a small divot on the superior part of the wound. This area did not probe the bone but I found this a little concerning as this was the area with exposed bone before we be able to get this  to granulate forward. As I remember things she is not a candidate for skin substitutes secondary to an outlandish co-pay. I asked her husband to look into the out of pocket max for medications if he has 1 [Regranex] or totally for skin substitutes 05/07/17; the patient arrives today with a wound roughly the same size although under careful inspection under the light there appears to be some epithelialization. Her intake  nurse noted that the Endoform seemed to be placed over the wound rather than in the deeper divots they could really benefit from the Endoform. At this point I have no plans to change the Endoform as at one point this was at least 33% exposed bone and the rest of the wound very close to the underlying phalanx 05/14/17; no major change in the size or appearance of the wound. We have been using Endoform for a prolonged period of time with reasonably good improvement in the epithelialization i.e. no exposed bone especially proximally. Unless we've not made a lot of changes in the last several weeks. She does not complain of pain. She was revascularized early this year or perhaps late last year by Dr. Bridgett Larsson and I wonder if he will need to have a look at her, I will need to review the actual vascular procedure which I think was a distal popliteal bypass 05/21/17; switched to either for a blue last week. Patient arrived in clinic with the wound not measuring any better but the surface looking some better. Unfortunately she had some surface slough and when I went to remove this superiorly she had exposed bone. Previously she had had exposed bone in the inferior part of the wound however this is granulated over some weeks ago. Clearly this is a major step back for her. With some difficulty I was able to obtain a piece of the bone probing through the superior part of the wound for culture. We did not send pathology. It is likely that she is going to need imaging of this site but I did not order  this today 05/28/17; bone culture I took last week showed MSSA. She still has exposed bone however I could not get another piece for pathology. She had an MRI 4 months ago that did not show osteoarthritic at time. Wound rapidly deteriorated superiorly and I suspect this is underlying osteomyelitis. We have been using Hydrofera Blue 06/04/17 on evaluation today patient's wound appears to actually be doing a little bit better visually compared to last week's evaluation which is good news. Nonetheless she does have osteomyelitis of the toe which does not appear to be MRSA. No fevers, chills, nausea, or vomiting noted at this time. She is having no discomfort. 06/11/17; patient's wound looks a little healthier than I remember seeing it 2 weeks ago. There is no exposed bone and the surface of the wound appears well granulated. We've been using hydrofera blue. I note that she was started on doxycycline which was MSSA. Her appointment with Dr. Ola Spurr is on November 12 I believe. 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 Vallarie Mare of vascular surgery on 11/9. He offered right leg angiography possible intervention however the patient refused. I've discussed this with the patient's husband today she did not want any further procedures. Also noteworthy that Dr. Vallarie Mare stated that this wound had not healed in 3 years, I was not aware of this degree of chronicity in this area. She has underlying osteomyelitis by bone culture. Culture of this grew MSSA, she is allergic to penicillin and therefore not a candidate for beta lactams. She saw Dr. Ola Spurr of infectious disease this morning, he continued her on doxycycline for another month and apparently sent in a prescription. We have been using Hydrofera Blue. ABI's update by Dr. Bridgett Larsson right 0.62, left 0.89. Monophasic wave forms 06/25/17 on evaluation today patient appears to be doing well in regard to her right great toe wound. She  is  still taking the doxycycline as prescribed by Dr. Ola Spurr. The Hydrofera Blue Dressing's to be doing as well as anything has for her up to this point and we are still waiting on an insurance authorization for the Eek. She is having no pain which is good news. No fevers, chills, nausea, or vomiting noted at this time. 07/02/17; still taking doxycycline as prescribed by Dr. Ola Spurr. Hydrofera Blue continues. We have no palpable bone. Still have not had had approval of Apligraf 08/06/16; the patient arrives today with [facility] even though we apparently had changed to Sorbact last time. Her co-pay for Regranex is $389 in discussion with the patient and her husband this was excessive. We'll continue with the Sorbact and standard dressings for now 08/20/17; we have been using sorbact. The wound bed still requires debridement. Wound measurements are slightly better. 09/03/17;using Sorbact.no debridement today. Wound measurements slightly better. There is some discoloration of the tip of her toelooks like bruising 09/17/17; using Sorbact. No debridement today. Wound dimensions are about the same. Still some discoloration at the tip of her great toe. This does not look any worse than last time [place], Sydell V. ([id number]) 10/01/17; using sorbact right great toe I thought she might have surface epithelialization last time although it is certainly not look like that today nevertheless her dimensions are better. Continued surface eschar on the tip of her toe but this is not progressing. She is actually complaining of the left great toe 10/15/17; this is a very difficult area on the dorsal aspect of her proximal right great toe. At one point this wound had exposed bone however we have managed to get some degree of epithelialization. She was revascularized by Dr. Vallarie Mare in [place]. She saw Dr. Ola Spurr for underlying osteomyelitis. She is not a candidate for advanced treatment options [insurance  issues]. She comes in today with a new area on the tip of her right great toe. The exact history here is unclear. We have been using sorbact 10/29/17; patient arrives today with very significant bilateral increase edema which appears to extend into the thighs. This is tight and not particularly pitting however it looks a lot more impressive than I remember. Not surprisingly the wounds on her right great toe have increased in size with weeping edema fluid as the edema extends into the dorsal foot itself. She also has a wound on the tip of her right great toe which is a new wound from 2 weeks ago. We have been using sorbact. 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 He is on Demadex presumably twice a day at 20 mg. I have asked her family to make her an appointment with her primary physicians at the [facility] [facility] to go over this degree of edema. This is causing I think deterioration in the right great toe wound. She also has significant PAD and is status post right leg bypass graft. I think this was done by [facility]. I believe they also said that there was nothing further that could be done with regards to her vascular status. 11/12/17; patient is going to see her primary doctor this afternoon with regards to lower extremity edema. She has 2 open wounds on the right great toe the original wound on the dorsal proximal part and then a more necrotic looking area on the tip of her right great toe. We took some time today to review her vascular status.  The patient had a relate below knee popliteal to peroneal artery bypass with reversed greater saphenous vein on 06/13/17; she also had endarterectomy of the midsegment of the peroneal artery. 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 I think at some point in time after that she was offered an arteriogram on the right but she declined. I've used Silver collagen to her wounds recently. 11/26/17; patient has 2 open wounds on the right great toe tip as well as the dorsal aspect of her right great toe. She has known PAD. I've been trying to get her back to see vein and vascular in [facility] to see if there are options for endovascular surgery to help with perfusion although the patient and her husband may not want any more surgery. We've been using silver alginate because of maceration. She does not have an option for advanced treatment/skin substitutes 12/10/17; continued open wounds much the same as 2 weeks ago on the dorsal aspect of the right great toe on the right great toe tip. I've encouraged to follow-up with Dr. Bridgett Larsson of vein and vascular to explore the possibility that there may be correctable ischemia involved in nonhealing these areas. We made considerable progress on the dorsal right great toe however it is stalled recently. We do not have an advanced treatment option. Originally this had exposed bone however there is a granulated base. 12/24/17; patient went back to see vein and vascular in [place]. She was seen by Manus Gunning NP. ABIs on the right were noted to be 0.84 and TBI of the right and 0.62. On the left her ABI was 0.82 and her TBI of 0.75. Waveforms were monophasic at the posterior tibial and dorsalis pedis bilaterally. 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 Her husband reiterates today that she does not want an arteriogram therefore I'm not going to press the issue. We've been using silver collagen to the wounds on the tip of the right great toe and dorsal surface of the right great toe. 01/07/18; patient arrives today  with the tip of her toe healed. An cerebral amount of slough over the wound on the dorsal toe. As noted on 12/24/17 no options currently for revascularization the patient will not allow an arteriogram 01/21/18; typical of her right great toe remains healed. Still large amount of slough over the wound on the dorsal toe. No options for revascularization. We've been using Medihoney 02/04/18; no major change. Still a punched out area on the right great toe dorsal wound. Covered in a difficult necrotic debris. 02/18/18; no major change. Still a punched out area over the right great toe. They're using Proctor 03/04/18; no major change. The wound is stalled. Still a punched out area over the right dorsal great toe. We've been using medihone for about a month not much change in the wound dimensions or the characteristics of the wound surface. We've previously used Iodoflex, Hydrofera Blue, sorbact, and I think at one point [place]. She has PAD and does not have any revascularization options although a wound distally on the tip of the great toe did close over 03/25/18; absolutely no change. Still adherent debris over the wound surface there is no palpable bone but the wound  still has some depth. This is predominantly a arterial wound and she basically has refused further attempts at revascularization. I would consider this a totally palliative situation except for she managed to heal the wound at the tip of the same toe some Rostron, Devany V. ([id number]) months ago. I changed her back to Iodoflex The other issue is that [facility] has called and wants to pare back on the visits which have been 3 times a week. They will drop to 2 times a week next week 04/29/18; since the patient was last here she was admitted to hospital from 9/6 through 9/13. She was noted to have altered LOC, declining ability to walk. 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 She was discharged to [place]. She is on oxygen chronically at 3 L. I don't think they've been doing anything specific to the wound. Her husband laments that she is no longer able to stand and walk or transfer. Yet he seems determined to take her home 05/13/18; 2 week follow-up. The patient is still at [place]. She arrives lethargic in clinic. She was also confused during her index hospitalization. She was felt to have metabolic encephalopathy secondary to UTI. Although she also had hypercapnic respiratory failure secondary to COPD and sleep apnea. She is on chronic oxygen. Paradoxically her wound actually looks a lot better and this is really filled in nicely. We're using silver collagen Objective Constitutional Sitting or standing Blood Pressure is within target range for patient.. Pulse regular and within target range for patient.[name] Respirations regular, non-labored and within target range.. Temperature is normal and within the target range for the patient.[name] appears in no distress. Vitals Time Taken: 11:29 AM, Height: 64 in, Weight: 200 lbs, BMI: 34.3, Temperature: 98.5 F, Pulse: 82 bpm, Respiratory Rate: 18 breaths/min, Blood Pressure: 137/54 mmHg, Pulse Oximetry: 96 %. Cardiovascular Pedal pulses are palpable faintly. General Notes: Would exam; the original wound on the dorsal aspect of her right great toe has closed and remarkably. Still some necrotic surface debris removed #3 curet. Hemostasis with direct pressure. Resultant wound bed looks really quite good repair there is no evidence of surrounding infection. I also note that she has a lot less edema in the right leg probably reflecting the fact that she is either in bed with her leg up or in the chair with her leg up. Integumentary (Hair, Skin) Wound #7 status is Open. Original cause of wound was Gradually  Appeared. The wound is located on the Right,Dorsal Metatarsal head first. The wound measures 0.9cm length x 0.5cm width x 0.2cm depth; 0.353cm^2 area and 0.071cm^3 volume. There is no tunneling or undermining noted. There is a small amount of serous drainage noted. The wound margin is distinct with the outline attached to the wound base. There is no granulation within the wound bed. There is a small (1-33%) amount of necrotic tissue within the wound bed including Adherent Slough. The periwound skin appearance had no abnormalities noted for texture. The periwound skin appearance had no abnormalities noted for color. The periwound skin appearance exhibited: Maceration. Periwound temperature was noted as No Abnormality. The periwound has tenderness on palpation. 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 There was a Selective/Open Wound Non- Viable Tissue Debridement with a total area of 0.45 sq cm performed by Ricard Dillon, MD. With the following instrument(s): Curette to remove Non-Viable tissue/material. Material removed includes [facility] after achieving pain control using Other (lidocaine 4%). No specimens were taken. A time out was conducted at 11:50, prior to the start of the procedure. There was no bleeding. The procedure was tolerated well. Post Debridement Measurements: 0.9cm length x 0.5cm width x 0.1cm depth; 0.035cm^3  volume. Character of Wound/Ulcer Post Debridement requires further debridement. Severity of Tissue Post Debridement is: Fat layer exposed. Post procedure Diagnosis Wound #7: Same as Pre-Procedure Plan Wound Cleansing: Wound #7 Right,Dorsal Metatarsal head first: Cleanse wound with mild soap and water Anesthetic (add to Medication List): Wound #7 Right,Dorsal Metatarsal head first: Topical Lidocaine 4% cream applied to wound bed prior to debridement (In Clinic Only). Primary Wound Dressing: Wound #7 Right,Dorsal Metatarsal head first: Silver Collagen Secondary Dressing: Wound #7 Right,Dorsal Metatarsal head first: ABD and Kerlix/Conform Dressing Change Frequency: Wound #7 Right,Dorsal Metatarsal head first: Change Dressing Monday, Wednesday, Friday Follow-up Appointments: Wound #7 Right,Dorsal Metatarsal head first: Return Appointment in 1 week. Edema Control: Wound #7 Right,Dorsal Metatarsal head first: Scharnhorst, Naiara V. ([id number]) Patient to wear own compression stockings Elevate legs to the level of the heart and pump ankles as often as possible #1 continue with the silver collagen. #2 miracullously this wound looks like it's on its way to closing Electronic Signature(s) Signed: 05/13/2018 6:14:59 PM By: Linton Ham MD Entered By: Linton Ham on 05/13/2018 13:10:52 Leavelle, Maycel V. ([id number]) -------------------------------------------------------------------------------- SuperBill Details Patient Name: Zertuche, Luiza V. Date of Service: 05/13/2018 Medical Record Number: [id number] Patient Account Number: [id number] Date of Birth/Sex: 09/16/1940 (77 y.o. 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 E11.52 Type 2 diabetes mellitus with diabetic peripheral angiopathy with gangrene I70.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 of foot Facility Procedures CPT4 Code Description: [id number] - DEBRIDE WOUND 1ST 20 SQ CM OR < ICD-10 Diagnosis Description L97.519 Non-pressure chronic ulcer of other part of right foot with unspeci Modifier: fied severit Quantity: 1 y Physician Procedures CPT4 Code Description: [id number] - WC PHYS DEBR WO ANESTH 20 SQ CM ICD-10 Diagnosis Description L97.519 Non-pressure chronic ulcer of other part of right foot with unspeci Modifier: fied severit Quantity: 1 y Engineer, maintenance) Signed: 05/13/2018 6:14:59 PM By: Linton Ham MD Entered By: Linton Ham on 05/13/2018 13:11:17

## 2018-05-27 ENCOUNTER — Ambulatory Visit: Payer: Medicare HMO | Admitting: Internal Medicine

## 2018-05-29 ENCOUNTER — Ambulatory Visit: Payer: Medicare HMO | Admitting: Physician Assistant

## 2018-06-03 ENCOUNTER — Encounter: Payer: No Typology Code available for payment source | Admitting: Internal Medicine

## 2018-06-03 ENCOUNTER — Inpatient Hospital Stay
Admission: EM | Admit: 2018-06-03 | Discharge: 2018-06-10 | DRG: 871 | Disposition: A | Discharge status: Home Health | Payer: Medicare HMO | Attending: Internal Medicine | Admitting: Internal Medicine

## 2018-06-03 ENCOUNTER — Encounter: Payer: Self-pay | Admitting: Emergency Medicine

## 2018-06-03 ENCOUNTER — Emergency Department: Payer: Medicare HMO

## 2018-06-03 ENCOUNTER — Other Ambulatory Visit: Payer: Self-pay

## 2018-06-03 DIAGNOSIS — I130 Hypertensive heart and chronic kidney disease with heart failure and stage 1 through stage 4 chronic kidney disease, or unspecified chronic kidney disease: Secondary | ICD-10-CM | POA: Diagnosis present

## 2018-06-03 DIAGNOSIS — E872 Acidosis: Secondary | ICD-10-CM | POA: Diagnosis present

## 2018-06-03 DIAGNOSIS — E1122 Type 2 diabetes mellitus with diabetic chronic kidney disease: Secondary | ICD-10-CM | POA: Diagnosis present

## 2018-06-03 DIAGNOSIS — I5032 Chronic diastolic (congestive) heart failure: Secondary | ICD-10-CM | POA: Diagnosis present

## 2018-06-03 DIAGNOSIS — E1151 Type 2 diabetes mellitus with diabetic peripheral angiopathy without gangrene: Secondary | ICD-10-CM | POA: Diagnosis present

## 2018-06-03 DIAGNOSIS — Y95 Nosocomial condition: Secondary | ICD-10-CM | POA: Diagnosis present

## 2018-06-03 DIAGNOSIS — E785 Hyperlipidemia, unspecified: Secondary | ICD-10-CM | POA: Diagnosis present

## 2018-06-03 DIAGNOSIS — J441 Chronic obstructive pulmonary disease with (acute) exacerbation: Secondary | ICD-10-CM

## 2018-06-03 DIAGNOSIS — E873 Alkalosis: Secondary | ICD-10-CM | POA: Diagnosis present

## 2018-06-03 DIAGNOSIS — N179 Acute kidney failure, unspecified: Secondary | ICD-10-CM | POA: Diagnosis present

## 2018-06-03 DIAGNOSIS — N3000 Acute cystitis without hematuria: Secondary | ICD-10-CM | POA: Diagnosis present

## 2018-06-03 DIAGNOSIS — Z8673 Personal history of transient ischemic attack (TIA), and cerebral infarction without residual deficits: Secondary | ICD-10-CM | POA: Diagnosis not present

## 2018-06-03 DIAGNOSIS — K219 Gastro-esophageal reflux disease without esophagitis: Secondary | ICD-10-CM | POA: Diagnosis present

## 2018-06-03 DIAGNOSIS — E7800 Pure hypercholesterolemia, unspecified: Secondary | ICD-10-CM | POA: Diagnosis present

## 2018-06-03 DIAGNOSIS — E875 Hyperkalemia: Secondary | ICD-10-CM | POA: Diagnosis present

## 2018-06-03 DIAGNOSIS — Z91013 Allergy to seafood: Secondary | ICD-10-CM

## 2018-06-03 DIAGNOSIS — J9622 Acute and chronic respiratory failure with hypercapnia: Secondary | ICD-10-CM | POA: Diagnosis not present

## 2018-06-03 DIAGNOSIS — Z833 Family history of diabetes mellitus: Secondary | ICD-10-CM

## 2018-06-03 DIAGNOSIS — R0902 Hypoxemia: Secondary | ICD-10-CM | POA: Diagnosis present

## 2018-06-03 DIAGNOSIS — R319 Hematuria, unspecified: Secondary | ICD-10-CM | POA: Diagnosis present

## 2018-06-03 DIAGNOSIS — Z79899 Other long term (current) drug therapy: Secondary | ICD-10-CM

## 2018-06-03 DIAGNOSIS — G4730 Sleep apnea, unspecified: Secondary | ICD-10-CM | POA: Diagnosis present

## 2018-06-03 DIAGNOSIS — Z7951 Long term (current) use of inhaled steroids: Secondary | ICD-10-CM

## 2018-06-03 DIAGNOSIS — Z882 Allergy status to sulfonamides status: Secondary | ICD-10-CM

## 2018-06-03 DIAGNOSIS — E11621 Type 2 diabetes mellitus with foot ulcer: Secondary | ICD-10-CM | POA: Diagnosis present

## 2018-06-03 DIAGNOSIS — B9620 Unspecified Escherichia coli [E. coli] as the cause of diseases classified elsewhere: Secondary | ICD-10-CM | POA: Diagnosis present

## 2018-06-03 DIAGNOSIS — R4182 Altered mental status, unspecified: Secondary | ICD-10-CM | POA: Diagnosis not present

## 2018-06-03 DIAGNOSIS — G9341 Metabolic encephalopathy: Secondary | ICD-10-CM | POA: Diagnosis present

## 2018-06-03 DIAGNOSIS — A419 Sepsis, unspecified organism: Secondary | ICD-10-CM | POA: Diagnosis present

## 2018-06-03 DIAGNOSIS — N390 Urinary tract infection, site not specified: Secondary | ICD-10-CM | POA: Diagnosis present

## 2018-06-03 DIAGNOSIS — Z794 Long term (current) use of insulin: Secondary | ICD-10-CM

## 2018-06-03 DIAGNOSIS — L97509 Non-pressure chronic ulcer of other part of unspecified foot with unspecified severity: Secondary | ICD-10-CM | POA: Diagnosis present

## 2018-06-03 DIAGNOSIS — Z1612 Extended spectrum beta lactamase (ESBL) resistance: Secondary | ICD-10-CM | POA: Diagnosis present

## 2018-06-03 DIAGNOSIS — E1140 Type 2 diabetes mellitus with diabetic neuropathy, unspecified: Secondary | ICD-10-CM | POA: Diagnosis present

## 2018-06-03 DIAGNOSIS — Z7982 Long term (current) use of aspirin: Secondary | ICD-10-CM

## 2018-06-03 DIAGNOSIS — J9621 Acute and chronic respiratory failure with hypoxia: Secondary | ICD-10-CM | POA: Diagnosis present

## 2018-06-03 DIAGNOSIS — R0602 Shortness of breath: Secondary | ICD-10-CM

## 2018-06-03 DIAGNOSIS — Z6841 Body Mass Index (BMI) 40.0 and over, adult: Secondary | ICD-10-CM | POA: Diagnosis not present

## 2018-06-03 DIAGNOSIS — Z515 Encounter for palliative care: Secondary | ICD-10-CM | POA: Diagnosis not present

## 2018-06-03 DIAGNOSIS — J440 Chronic obstructive pulmonary disease with acute lower respiratory infection: Secondary | ICD-10-CM | POA: Diagnosis present

## 2018-06-03 DIAGNOSIS — I248 Other forms of acute ischemic heart disease: Secondary | ICD-10-CM | POA: Diagnosis present

## 2018-06-03 DIAGNOSIS — Z7189 Other specified counseling: Secondary | ICD-10-CM | POA: Diagnosis not present

## 2018-06-03 DIAGNOSIS — I350 Nonrheumatic aortic (valve) stenosis: Secondary | ICD-10-CM | POA: Diagnosis present

## 2018-06-03 DIAGNOSIS — D649 Anemia, unspecified: Secondary | ICD-10-CM | POA: Diagnosis present

## 2018-06-03 DIAGNOSIS — I5020 Unspecified systolic (congestive) heart failure: Secondary | ICD-10-CM | POA: Diagnosis not present

## 2018-06-03 DIAGNOSIS — J9600 Acute respiratory failure, unspecified whether with hypoxia or hypercapnia: Secondary | ICD-10-CM

## 2018-06-03 DIAGNOSIS — Z7902 Long term (current) use of antithrombotics/antiplatelets: Secondary | ICD-10-CM

## 2018-06-03 DIAGNOSIS — J189 Pneumonia, unspecified organism: Secondary | ICD-10-CM | POA: Diagnosis present

## 2018-06-03 DIAGNOSIS — N189 Chronic kidney disease, unspecified: Secondary | ICD-10-CM | POA: Diagnosis present

## 2018-06-03 DIAGNOSIS — Z823 Family history of stroke: Secondary | ICD-10-CM

## 2018-06-03 DIAGNOSIS — E870 Hyperosmolality and hypernatremia: Secondary | ICD-10-CM | POA: Diagnosis present

## 2018-06-03 DIAGNOSIS — Z8249 Family history of ischemic heart disease and other diseases of the circulatory system: Secondary | ICD-10-CM

## 2018-06-03 DIAGNOSIS — R400 Somnolence: Secondary | ICD-10-CM

## 2018-06-03 LAB — LACTIC ACID, PLASMA
Lactic Acid, Venous: 1.9 mmol/L (ref 0.5–1.9)
Lactic Acid, Venous: 2 mmol/L (ref 0.5–1.9)

## 2018-06-03 LAB — URINALYSIS, COMPLETE (UACMP) WITH MICROSCOPIC
BILIRUBIN URINE: NEGATIVE
Glucose, UA: >=500 mg/dL — AB
Ketones, ur: 20 mg/dL — AB
Nitrite: NEGATIVE
Protein, ur: 30 mg/dL — AB
SPECIFIC GRAVITY, URINE: 1.008 (ref 1.005–1.030)
WBC, UA: >50 WBC/hpf — ABNORMAL HIGH (ref 0–5)
pH: 5 (ref 5.0–8.0)

## 2018-06-03 LAB — BLOOD GAS, ARTERIAL
ACID-BASE EXCESS: 21.8 mmol/L — AB (ref 0.0–2.0)
Acid-Base Excess: 16.7 mmol/L — ABNORMAL HIGH (ref 0.0–2.0)
Bicarbonate: 45.8 mmol/L — ABNORMAL HIGH (ref 20.0–28.0)
Bicarbonate: 51.5 mmol/L — ABNORMAL HIGH (ref 20.0–28.0)
DELIVERY SYSTEMS: POSITIVE
Delivery systems: POSITIVE
EXPIRATORY PAP: 8
Expiratory PAP: 8
FIO2: 0.5
FIO2: 0.35
Inspiratory PAP: 18
Inspiratory PAP: 18
MECHANICAL RATE: 12
O2 Saturation: 98.2 %
O2 Saturation: 97.3 %
PCO2 ART: 89 mmHg — AB (ref 32.0–48.0)
PO2 ART: 97 mmHg (ref 83.0–108.0)
Patient temperature: 37
Patient temperature: 37
RATE: 8 resp/min
pCO2 arterial: 81 mmHg (ref 32.0–48.0)
pH, Arterial: 7.36 (ref 7.350–7.450)
pH, Arterial: 7.37 (ref 7.350–7.450)
pO2, Arterial: 112 mmHg — ABNORMAL HIGH (ref 83.0–108.0)

## 2018-06-03 LAB — FIBRIN DERIVATIVES D-DIMER (ARMC ONLY): Fibrin derivatives D-dimer (ARMC): 706.51 ng/mL (FEU) — ABNORMAL HIGH (ref 0.00–499.00)

## 2018-06-03 LAB — COMPREHENSIVE METABOLIC PANEL
ALBUMIN: 3 g/dL — AB (ref 3.5–5.0)
ALT: 10 U/L (ref 0–44)
AST: 16 U/L (ref 15–41)
Alkaline Phosphatase: 72 U/L (ref 38–126)
Anion gap: 13 (ref 5–15)
BILIRUBIN TOTAL: 1.6 mg/dL — AB (ref 0.3–1.2)
BUN: 36 mg/dL — ABNORMAL HIGH (ref 8–23)
CALCIUM: 9.5 mg/dL (ref 8.9–10.3)
CHLORIDE: 97 mmol/L — AB (ref 98–111)
CO2: 36 mmol/L — ABNORMAL HIGH (ref 22–32)
Creatinine, Ser: 1.08 mg/dL — ABNORMAL HIGH (ref 0.44–1.00)
GFR calc Af Amer: 57 mL/min — ABNORMAL LOW (ref >60)
GFR, EST NON AFRICAN AMERICAN: 49 mL/min — AB (ref >60)
Glucose, Bld: 340 mg/dL — ABNORMAL HIGH (ref 70–99)
POTASSIUM: 5.3 mmol/L — AB (ref 3.5–5.1)
Sodium: 146 mmol/L — ABNORMAL HIGH (ref 135–145)
TOTAL PROTEIN: 6.9 g/dL (ref 6.5–8.1)

## 2018-06-03 LAB — TROPONIN I
TROPONIN I: 0.65 ng/mL — AB (ref <0.03)
TROPONIN I: 0.8 ng/mL — AB (ref <0.03)
Troponin I: 0.77 ng/mL (ref <0.03)

## 2018-06-03 LAB — CBC WITH DIFFERENTIAL/PLATELET
ABS IMMATURE GRANULOCYTES: 0.09 10*3/uL — AB (ref 0.00–0.07)
Basophils Absolute: 0 10*3/uL (ref 0.0–0.1)
Basophils Relative: 0 %
EOS PCT: 0 %
Eosinophils Absolute: 0.1 10*3/uL (ref 0.0–0.5)
HEMATOCRIT: 40.2 % (ref 36.0–46.0)
Hemoglobin: 11.3 g/dL — ABNORMAL LOW (ref 12.0–15.0)
IMMATURE GRANULOCYTES: 1 %
LYMPHS ABS: 0.6 10*3/uL — AB (ref 0.7–4.0)
Lymphocytes Relative: 3 %
MCH: 26.5 pg (ref 26.0–34.0)
MCHC: 28.1 g/dL — ABNORMAL LOW (ref 30.0–36.0)
MCV: 94.4 fL (ref 80.0–100.0)
MONO ABS: 1.4 10*3/uL — AB (ref 0.1–1.0)
MONOS PCT: 8 %
Neutro Abs: 14.7 10*3/uL — ABNORMAL HIGH (ref 1.7–7.7)
Neutrophils Relative %: 88 %
PLATELETS: 156 10*3/uL (ref 150–400)
RBC: 4.26 MIL/uL (ref 3.87–5.11)
RDW: 15.7 % — ABNORMAL HIGH (ref 11.5–15.5)
Smear Review: NORMAL
WBC: 16.8 10*3/uL — ABNORMAL HIGH (ref 4.0–10.5)
nRBC: 0 % (ref 0.0–0.2)

## 2018-06-03 LAB — BRAIN NATRIURETIC PEPTIDE: B NATRIURETIC PEPTIDE 5: 625 pg/mL — AB (ref 0.0–100.0)

## 2018-06-03 LAB — GLUCOSE, CAPILLARY
GLUCOSE-CAPILLARY: 284 mg/dL — AB (ref 70–99)
Glucose-Capillary: 335 mg/dL — ABNORMAL HIGH (ref 70–99)
Glucose-Capillary: 309 mg/dL — ABNORMAL HIGH (ref 70–99)
Glucose-Capillary: 229 mg/dL — ABNORMAL HIGH (ref 70–99)

## 2018-06-03 LAB — MRSA PCR SCREENING: MRSA BY PCR: NEGATIVE

## 2018-06-03 LAB — PROCALCITONIN: Procalcitonin: 3.07 ng/mL

## 2018-06-03 MED ORDER — NITROGLYCERIN 0.4 MG SL SUBL: 0.4000 mg | SUBLINGUAL_TABLET | SUBLINGUAL | Status: DC | PRN

## 2018-06-03 MED ORDER — VANCOMYCIN HCL IN DEXTROSE 1-5 GM/200ML-% IV SOLN
1000.0000 mg | Freq: Once | INTRAVENOUS | Status: AC
  Administered 2018-06-03: 1000 mg via INTRAVENOUS
  Filled 2018-06-03: qty 200

## 2018-06-03 MED ORDER — FLUTICASONE PROPIONATE 50 MCG/ACT NA SUSP
1.0000 | Freq: Every evening | NASAL | Status: DC
  Administered 2018-06-06 – 2018-06-09 (×5): 1 via NASAL
  Filled 2018-06-03 (×2): qty 16

## 2018-06-03 MED ORDER — ISOSORBIDE DINITRATE 30 MG PO TABS
30.0000 mg | ORAL_TABLET | Freq: Every day | ORAL | Status: DC
  Administered 2018-06-04 – 2018-06-10 (×7): 30 mg via ORAL
  Filled 2018-06-03 (×8): qty 1

## 2018-06-03 MED ORDER — ACETAMINOPHEN 500 MG PO TABS
1000.0000 mg | ORAL_TABLET | Freq: Four times a day (QID) | ORAL | Status: DC | PRN
  Administered 2018-06-07 – 2018-06-09 (×3): 1000 mg via ORAL
  Filled 2018-06-03 (×3): qty 2

## 2018-06-03 MED ORDER — VANCOMYCIN HCL 10 G IV SOLR
1250.0000 mg | INTRAVENOUS | Status: DC
  Administered 2018-06-03: 1250 mg via INTRAVENOUS
  Filled 2018-06-03 (×2): qty 1250

## 2018-06-03 MED ORDER — CLOPIDOGREL BISULFATE 75 MG PO TABS
75.0000 mg | ORAL_TABLET | Freq: Every day | ORAL | Status: DC
  Administered 2018-06-04 – 2018-06-10 (×7): 75 mg via ORAL
  Filled 2018-06-03 (×7): qty 1

## 2018-06-03 MED ORDER — DOCUSATE SODIUM 100 MG PO CAPS: 100.0000 mg | ORAL_CAPSULE | Freq: Two times a day (BID) | ORAL | Status: DC | PRN

## 2018-06-03 MED ORDER — POTASSIUM CHLORIDE CRYS ER 20 MEQ PO TBCR: 20.0000 meq | EXTENDED_RELEASE_TABLET | Freq: Every day | ORAL | Status: DC

## 2018-06-03 MED ORDER — POLYETHYLENE GLYCOL 3350 17 G PO PACK
17.0000 g | PACK | Freq: Two times a day (BID) | ORAL | Status: DC
  Administered 2018-06-04 – 2018-06-10 (×6): 17 g via ORAL
  Filled 2018-06-03 (×8): qty 1

## 2018-06-03 MED ORDER — PREGABALIN 50 MG PO CAPS
50.0000 mg | ORAL_CAPSULE | Freq: Two times a day (BID) | ORAL | Status: DC
  Administered 2018-06-04 – 2018-06-10 (×13): 50 mg via ORAL
  Filled 2018-06-03 (×13): qty 1

## 2018-06-03 MED ORDER — INSULIN ASPART 100 UNIT/ML ~~LOC~~ SOLN
0.0000 [IU] | SUBCUTANEOUS | Status: DC
  Administered 2018-06-03: 5 [IU] via SUBCUTANEOUS
  Administered 2018-06-03: 8 [IU] via SUBCUTANEOUS
  Administered 2018-06-03: 11 [IU] via SUBCUTANEOUS
  Administered 2018-06-04: 15 [IU] via SUBCUTANEOUS
  Administered 2018-06-04 – 2018-06-05 (×5): 8 [IU] via SUBCUTANEOUS
  Administered 2018-06-05 (×2): 11 [IU] via SUBCUTANEOUS
  Administered 2018-06-05: 5 [IU] via SUBCUTANEOUS
  Administered 2018-06-05: 3 [IU] via SUBCUTANEOUS
  Administered 2018-06-06: 17:00:00 11 [IU] via SUBCUTANEOUS
  Administered 2018-06-06 (×2): 3 [IU] via SUBCUTANEOUS
  Administered 2018-06-06: 2 [IU] via SUBCUTANEOUS
  Administered 2018-06-06: 8 [IU] via SUBCUTANEOUS
  Administered 2018-06-06: 15 [IU] via SUBCUTANEOUS
  Administered 2018-06-07: 21:00:00 8 [IU] via SUBCUTANEOUS
  Administered 2018-06-07: 10:00:00 2 [IU] via SUBCUTANEOUS
  Administered 2018-06-07: 8 [IU] via SUBCUTANEOUS
  Administered 2018-06-07: 3 [IU] via SUBCUTANEOUS
  Administered 2018-06-07 – 2018-06-08 (×2): 5 [IU] via SUBCUTANEOUS
  Administered 2018-06-08 (×2): 3 [IU] via SUBCUTANEOUS
  Administered 2018-06-08: 13:00:00 2 [IU] via SUBCUTANEOUS
  Filled 2018-06-03 (×28): qty 1

## 2018-06-03 MED ORDER — IPRATROPIUM-ALBUTEROL 0.5-2.5 (3) MG/3ML IN SOLN: 3.0000 mL | Freq: Four times a day (QID) | RESPIRATORY_TRACT | Status: DC | PRN

## 2018-06-03 MED ORDER — ATORVASTATIN CALCIUM 20 MG PO TABS
20.0000 mg | ORAL_TABLET | Freq: Every day | ORAL | Status: DC
  Administered 2018-06-04 – 2018-06-10 (×7): 20 mg via ORAL
  Filled 2018-06-03 (×7): qty 1

## 2018-06-03 MED ORDER — CARVEDILOL 3.125 MG PO TABS
6.2500 mg | ORAL_TABLET | Freq: Two times a day (BID) | ORAL | Status: DC
  Administered 2018-06-04 – 2018-06-10 (×13): 6.25 mg via ORAL
  Filled 2018-06-03 (×7): qty 2
  Filled 2018-06-03 (×2): qty 1
  Filled 2018-06-03 (×2): qty 2
  Filled 2018-06-03: qty 1
  Filled 2018-06-03: qty 2

## 2018-06-03 MED ORDER — IPRATROPIUM-ALBUTEROL 0.5-2.5 (3) MG/3ML IN SOLN
3.0000 mL | Freq: Once | RESPIRATORY_TRACT | Status: AC
  Administered 2018-06-03: 3 mL via RESPIRATORY_TRACT

## 2018-06-03 MED ORDER — ASPIRIN 325 MG PO TABS
325.0000 mg | ORAL_TABLET | Freq: Every day | ORAL | Status: DC
  Administered 2018-06-04 – 2018-06-10 (×7): 325 mg via ORAL
  Filled 2018-06-03 (×7): qty 1

## 2018-06-03 MED ORDER — SODIUM ZIRCONIUM CYCLOSILICATE 10 G PO PACK
10.0000 g | PACK | Freq: Once | ORAL | Status: DC
  Filled 2018-06-03: qty 1

## 2018-06-03 MED ORDER — BUDESONIDE 0.25 MG/2ML IN SUSP
0.2500 mg | Freq: Two times a day (BID) | RESPIRATORY_TRACT | Status: DC
  Administered 2018-06-03 – 2018-06-10 (×14): 0.25 mg via RESPIRATORY_TRACT
  Filled 2018-06-03 (×14): qty 2

## 2018-06-03 MED ORDER — LORAZEPAM 2 MG/ML IJ SOLN
INTRAMUSCULAR | Status: AC
  Administered 2018-06-03: 1 mg via INTRAVENOUS
  Filled 2018-06-03: qty 1

## 2018-06-03 MED ORDER — VITAMIN D 1000 UNITS PO TABS
2000.0000 [IU] | ORAL_TABLET | Freq: Every day | ORAL | Status: DC
  Administered 2018-06-04 – 2018-06-10 (×7): 2000 [IU] via ORAL
  Filled 2018-06-03 (×7): qty 2

## 2018-06-03 MED ORDER — SODIUM CHLORIDE 0.9 % IV SOLN
2.0000 g | Freq: Two times a day (BID) | INTRAVENOUS | Status: DC
  Administered 2018-06-03 – 2018-06-05 (×4): 2 g via INTRAVENOUS
  Filled 2018-06-03 (×5): qty 2

## 2018-06-03 MED ORDER — IPRATROPIUM-ALBUTEROL 0.5-2.5 (3) MG/3ML IN SOLN
RESPIRATORY_TRACT | Status: AC
  Filled 2018-06-03: qty 6

## 2018-06-03 MED ORDER — HEPARIN SODIUM (PORCINE) 5000 UNIT/ML IJ SOLN
5000.0000 [IU] | Freq: Three times a day (TID) | INTRAMUSCULAR | Status: DC
  Administered 2018-06-03 – 2018-06-10 (×21): 5000 [IU] via SUBCUTANEOUS
  Filled 2018-06-03 (×22): qty 1

## 2018-06-03 MED ORDER — PANTOPRAZOLE SODIUM 40 MG PO TBEC
40.0000 mg | DELAYED_RELEASE_TABLET | Freq: Every day | ORAL | Status: DC
  Administered 2018-06-04 – 2018-06-10 (×7): 40 mg via ORAL
  Filled 2018-06-03 (×7): qty 1

## 2018-06-03 MED ORDER — IPRATROPIUM-ALBUTEROL 0.5-2.5 (3) MG/3ML IN SOLN
3.0000 mL | Freq: Four times a day (QID) | RESPIRATORY_TRACT | Status: DC
  Administered 2018-06-03 – 2018-06-06 (×11): 3 mL via RESPIRATORY_TRACT
  Filled 2018-06-03 (×11): qty 3

## 2018-06-03 MED ORDER — ASPIRIN 300 MG RE SUPP
300.0000 mg | Freq: Once | RECTAL | Status: AC
  Administered 2018-06-03: 300 mg via RECTAL
  Filled 2018-06-03: qty 1

## 2018-06-03 MED ORDER — DOCUSATE SODIUM 100 MG PO CAPS
100.0000 mg | ORAL_CAPSULE | Freq: Two times a day (BID) | ORAL | Status: DC
  Administered 2018-06-04 – 2018-06-10 (×11): 100 mg via ORAL
  Filled 2018-06-03 (×12): qty 1

## 2018-06-03 MED ORDER — SODIUM CHLORIDE 0.9 % IV SOLN
2.0000 g | Freq: Once | INTRAVENOUS | Status: AC
  Administered 2018-06-03: 2 g via INTRAVENOUS
  Filled 2018-06-03 (×2): qty 2

## 2018-06-03 MED ORDER — LORAZEPAM 2 MG/ML IJ SOLN
1.0000 mg | Freq: Once | INTRAMUSCULAR | Status: AC
  Administered 2018-06-03: 1 mg via INTRAVENOUS

## 2018-06-03 MED ORDER — LEVOFLOXACIN IN D5W 750 MG/150ML IV SOLN
750.0000 mg | Freq: Once | INTRAVENOUS | Status: AC
  Administered 2018-06-03: 750 mg via INTRAVENOUS
  Filled 2018-06-03: qty 150

## 2018-06-03 MED ORDER — LORAZEPAM 2 MG/ML IJ SOLN: 0.5000 mg | Freq: Once | INTRAMUSCULAR | Status: DC

## 2018-06-03 NOTE — H&P (Signed)
[facility] NAME: [name]    MR#:  [id number]  DATE OF BIRTH:  26-Mar-1941  DATE OF ADMISSION:  06/03/2018  PRIMARY CARE PHYSICIAN: [name], MD   REQUESTING/REFERRING PHYSICIAN: malinda  CHIEF COMPLAINT:   Chief Complaint  Patient presents with  . Respiratory Distress    HISTORY OF PRESENT ILLNESS: [name]  is a 77 y.o. female with a known history of CHF< Aortic stenosis, DM, Diabetic neuropathy, PAD, Stroke, Htn- was in hospital last month for UTI, respi failure, required bipap. Was in [facility] since then, not walking there in last month. Was sent today from Wound care clinic as she was drowsy, hypoxic. Noted to have high CO2, UTi, Respi failure, Infiltrates in CT chest- so given to hospitalist after starting on bipap.  PAST MEDICAL HISTORY:   Past Medical History:  Diagnosis Date  . Acute heart failure ([place])   . Aortic stenosis   . CHF (congestive heart failure) ([place])   . Complication of anesthesia    Hard to wake patient up after having anesthesia  . Diabetes mellitus without complication ([place])   . Diabetic neuropathy ([facility])    Takes Lyrica  . Edema of both legs    Takes Lasix  . GERD (gastroesophageal reflux disease)   . High cholesterol   . HTN (hypertension)   . Hypertension   . PAD (peripheral artery disease) ([place])   . Shortness of breath dyspnea   . Spasm of back muscles   . Stroke ([place])   . Wound, open    Right great toe    PAST SURGICAL HISTORY:  Past Surgical History:  Procedure Laterality Date  . BYPASS GRAFT POPLITEAL TO TIBIAL Right 02/28/2016   Procedure: BYPASS GRAFT RIGHT BELOW KNEE POPLITEAL TO PERONEAL USING REVERSED RIGHT GREATER SAPHENOUS VEIN;  Surgeon: Conrad Payette, MD;  Location: [place];  Service: Vascular;  Laterality: Right;  . CYST EXCISION     Abdomen  . EYE SURGERY Bilateral    Cataract removal  . INTRAMEDULLARY (IM) NAIL INTERTROCHANTERIC Left 02/04/2014    Procedure: INTRAMEDULLARY (IM) NAIL INTERTROCHANTRIC FEMORAL;  Surgeon: Mauri Pole, MD;  Location: [place];  Service: Orthopedics;  Laterality: Left;  . IR GENERIC HISTORICAL  03/01/2016   IR ANGIO INTRA EXTRACRAN SEL COM CAROTID INNOMINATE UNI R MOD SED 03/01/2016 [name], MD [facility]  . IR GENERIC HISTORICAL  03/01/2016   IR ENDOVASC INTRACRANIAL INF OTHER THAN THROMBO ART INC DIAG ANGIO 03/01/2016 [name], MD [facility]  . IR GENERIC HISTORICAL  03/01/2016   IR INTRAVSC STENT CERV CAROTID W/O EMB-PROT MOD SED INC ANGIO 03/01/2016 [name], MD [facility]  . IR GENERIC HISTORICAL  06/03/2016   IR RADIOLOGIST EVAL & MGMT 06/03/2016 [facility]  . ORIF TOE FRACTURE Right 02/08/2014   Procedure: OPEN REDUCTION INTERNAL FIXATION Right METATARSAL  FRACTURE ;  Surgeon: Wylene Simmer, MD;  Location: [place];  Service: Orthopedics;  Laterality: Right;  . PERIPHERAL VASCULAR CATHETERIZATION N/A 12/28/2015   Procedure: Abdominal Aortogram w/Lower Extremity;  Surgeon: Conrad Pen Mar, MD;  Location: [facility];  Service: Cardiovascular;  Laterality: N/A;  . RADIOLOGY WITH ANESTHESIA N/A 03/01/2016   Procedure: RADIOLOGY WITH ANESTHESIA;  Surgeon: [name], MD;  Location: [facility];  Service: Radiology;  Laterality: N/A;  . VEIN HARVEST Right 02/28/2016   Procedure: RIGHT GREATER SAPHENOUS VEIN HARVEST;  Surgeon: Conrad Fairmount, MD;  Location: [facility];  Service: Vascular;  Laterality: Right;    SOCIAL HISTORY:  Social History   Tobacco Use  . Smoking status: Never Smoker  . Smokeless tobacco: Never Used  . Tobacco comment: Never smoked  Substance Use Topics  . Alcohol use: No    Alcohol/week: 0.0 standard drinks    FAMILY HISTORY:  Family History  Problem Relation Age of Onset  . Diabetes Other   . Liver disease Mother   . CVA Father   . Diabetes Father   . Hypertension Father     DRUG ALLERGIES:  Allergies  Allergen Reactions  . Iodine Anaphylaxis   . Penicillins Anaphylaxis    Tolerates ceftriaxone, cefazolin Has patient had a PCN reaction causing immediate rash, facial/tongue/throat swelling, SOB or lightheadedness with hypotension: Unknown Has patient had a PCN reaction causing severe rash involving mucus membranes or skin necrosis: Unknown Has patient had a PCN reaction that required hospitalization: Unknown Has patient had a PCN reaction occurring within the last 10 years: Unknown If all of the above answers are "NO", then may proceed with Cephalosporin u  . Shellfish Allergy Anaphylaxis  . Shellfish-Derived Products Anaphylaxis  . Sulfa Antibiotics Anaphylaxis  . Sulfacetamide Sodium Anaphylaxis  . Sulfasalazine Anaphylaxis  . Morphine And Related Other (See Comments)    Altered mental status    REVIEW OF SYSTEMS:   Patient is drowsy currently.  On BiPAP.  Cannot give review of system.  MEDICATIONS AT HOME:  Prior to Admission medications   Medication Sig Start Date End Date Taking? Authorizing Provider  acetaminophen (TYLENOL) 500 MG tablet Take 1,000 mg by mouth every 6 (six) hours as needed for mild pain.   Yes [provider]  albuterol (PROVENTIL HFA;VENTOLIN HFA) 108 (90 Base) MCG/ACT inhaler Inhale 2 puffs into the lungs every 6 (six) hours as needed for wheezing or shortness of breath.    Yes [provider]  aspirin 325 MG tablet Take 1 tablet (325 mg total) by mouth daily with breakfast. Patient taking differently: Take 325 mg by mouth daily.  07/10/16  Yes Holley Raring, MD  atorvastatin (LIPITOR) 20 MG tablet Take 1 tablet (20 mg total) by mouth daily. 03/13/16  Yes Virgina Jock A, PA-C  carvedilol (COREG) 6.25 MG tablet Take 6.25 mg by mouth 2 (two) times daily.    Yes [provider]  cetirizine (ZYRTEC) 10 MG tablet Take 10 mg by mouth daily.    Yes [provider]  cholecalciferol (VITAMIN D) 1000 units tablet Take 2,000 Units by mouth daily.    Yes [provider]  clopidogrel (PLAVIX) 75 MG tablet Take 1 tablet (75 mg total) by mouth daily with breakfast. 03/04/17  Yes Conrad Shalimar, MD  docusate sodium (COLACE) 100 MG capsule Take 100 mg by mouth 2 (two) times daily.   Yes [provider]  fluticasone (FLONASE) 50 MCG/ACT nasal spray Place 1 spray into both nostrils every evening.    Yes [provider]  insulin aspart (NOVOLOG) 100 UNIT/ML injection Inject 0-15 Units into the skin 3 (three) times daily with meals. Before each meal 3 times a day, 140-199 - 3 units, 200-250 - 5 units, 251-299 - 8 units,  300-349 - 12 units,  350 or above 15 units. Patient taking differently: Inject 14 Units into the skin 3 (three) times daily with meals.  02/09/14  Yes Thurnell Lose, MD  insulin glargine (LANTUS) 100 UNIT/ML injection Inject 0.27 mLs (27 Units total) into the skin  at bedtime. Patient taking differently: Inject 40 Units into the skin at bedtime.  09/16/16  Yes Reyne Dumas, MD  isosorbide dinitrate (ISORDIL) 30 MG tablet Take 30 mg by mouth daily.  04/16/17  Yes [provider]  nitroGLYCERIN (NITROSTAT) 0.4 MG SL tablet Place 1 tablet (0.4 mg total) under the tongue every 5 (five) minutes as needed for chest pain. 07/09/16  Yes Holley Raring, MD  nystatin ointment (MYCOSTATIN) Apply 1 application topically 3 (three) times daily. Apply to area below breasts   Yes [provider]  omeprazole (PRILOSEC) 20 MG capsule Take 1 capsule (20 mg total) by mouth daily. 04/17/18  Yes Sainani, Belia Heman, MD  polyethylene glycol (MIRALAX / GLYCOLAX) packet Take 17 g by mouth 2 (two) times daily. 02/09/14  Yes Thurnell Lose, MD  potassium chloride SA (K-DUR,KLOR-CON) 20 MEQ tablet Take 20 mEq by mouth daily.   Yes [provider]  pregabalin (LYRICA) 50 MG capsule Take 50 mg by mouth 2 (two) times daily.    Yes [provider]  torsemide (DEMADEX) 20 MG tablet Take 2 tablets (40 mg total) by mouth 2 (two) times daily.  04/17/18  Yes Sainani, Belia Heman, MD  VITAMIN E PO Take 1 capsule by mouth daily.   Yes [provider]      PHYSICAL EXAMINATION:   VITAL SIGNS: Blood pressure (!) 157/46, pulse 86, temperature 100 F (37.8 C), temperature source Axillary, resp. rate 16, SpO2 100 %.  GENERAL:  77 y.o.-year-old patient lying in the bed with no acute distress.  EYES: Pupils equal, round, reactive to light and accommodation. No scleral icterus. Extraocular muscles intact.  HEENT: Head atraumatic, normocephalic. Oropharynx and nasopharynx clear.  NECK:  Supple, no jugular venous distention. No thyroid enlargement, no tenderness.  LUNGS: Normal breath sounds bilaterally, no wheezing, rales,rhonchi or crepitation. No use of accessory muscles of respiration.  On BiPAP use currently. CARDIOVASCULAR: S1, S2 normal. No murmurs, rubs, or gallops.  ABDOMEN: Soft, nontender, nondistended. Bowel sounds present. No organomegaly or mass.  EXTREMITIES: Some pedal edema, no cyanosis, or clubbing.  Chronic postsurgical scars and changes on right leg and foot but does not have any active infection. NEUROLOGIC: Patient is drowsy on BiPAP and opens eyes to stimuli but goes back to sleep.   PSYCHIATRIC: The patient is drowsy. SKIN: No obvious rash, lesion, or ulcer.   LABORATORY PANEL:   CBC Recent Labs  Lab 06/03/18 1122  WBC 16.8*  HGB 11.3*  HCT 40.2  PLT 156  MCV 94.4  MCH 26.5  MCHC 28.1*  RDW 15.7*  LYMPHSABS 0.6*  MONOABS 1.4*  EOSABS 0.1  BASOSABS 0.0   ------------------------------------------------------------------------------------------------------------------  Chemistries  Recent Labs  Lab 06/03/18 1122  NA 146*  K 5.3*  CL 97*  CO2 36*  GLUCOSE 340*  BUN 36*  CREATININE 1.08*  CALCIUM 9.5  AST 16  ALT 10  ALKPHOS 72  BILITOT 1.6*   ------------------------------------------------------------------------------------------------------------------ CrCl cannot be calculated  (Unknown ideal weight.). ------------------------------------------------------------------------------------------------------------------ No results for input(s): TSH, T4TOTAL, T3FREE, THYROIDAB in the last 72 hours.  Invalid input(s): FREET3   Coagulation profile No results for input(s): INR, PROTIME in the last 168 hours. ------------------------------------------------------------------------------------------------------------------- No results for input(s): DDIMER in the last 72 hours. -------------------------------------------------------------------------------------------------------------------  Cardiac Enzymes Recent Labs  Lab 06/03/18 1122  TROPONINI 0.65*   ------------------------------------------------------------------------------------------------------------------ Invalid input(s): POCBNP  ---------------------------------------------------------------------------------------------------------------  Urinalysis    Component Value Date/Time   COLORURINE YELLOW (A) 06/03/2018 1122   APPEARANCEUR CLOUDY (A)  06/03/2018 1122   LABSPEC 1.008 06/03/2018 1122   PHURINE 5.0 06/03/2018 1122   GLUCOSEU >=500 (A) 06/03/2018 1122   HGBUR MODERATE (A) 06/03/2018 1122   BILIRUBINUR NEGATIVE 06/03/2018 1122   KETONESUR 20 (A) 06/03/2018 1122   PROTEINUR 30 (A) 06/03/2018 1122   UROBILINOGEN 0.2 02/12/2014 0110   NITRITE NEGATIVE 06/03/2018 1122   LEUKOCYTESUR LARGE (A) 06/03/2018 1122     RADIOLOGY: Dg Chest 1 View  Result Date: 06/03/2018 CLINICAL DATA:  Respiratory distress and altered mental status. EXAM: CHEST  1 VIEW COMPARISON:  04/13/2018. FINDINGS: Again noted is moderate cardiac enlargement. Bilateral pleural effusions and pulmonary vascular congestion noted. No airspace consolidation. IMPRESSION: 1. Suspect mild congestive heart failure. Electronically Signed   By: Kerby Moors M.D.   On: 06/03/2018 11:40   Ct Head Wo Contrast  Result Date:  06/03/2018 CLINICAL DATA:  respiratory distress and AMS. EMS states patient discharged from [facility] today. Per staff patient potentially posturing. Pt on 4L [place] by EMS. Hx of DBM, HTN, PVD, CHF. EDP at bedside upon arrival and pt placed on Bipap upon arrival. CODE SEPSIS. TKV EXAM: CT HEAD WITHOUT CONTRAST TECHNIQUE: Contiguous axial images were obtained from the base of the skull through the vertex without intravenous contrast. COMPARISON:  04/13/2018 and previous FINDINGS: Brain: Diffuse parenchymal atrophy. Patchy areas of hypoattenuation in deep and periventricular white matter bilaterally. Negative for acute intracranial hemorrhage, mass lesion, acute infarction, midline shift, or mass-effect. Acute infarct may be inapparent on noncontrast CT. Ventricles and sulci symmetric. Chronic bilateral basal ganglia mineralization. Vascular: Atherosclerotic and physiologic intracranial calcifications. Skull: Normal. Negative for fracture .1 cm calcified extra-axial lesion in the posterior cranial fossa on the right, stable since previous. Sinuses/Orbits: No acute finding. Other: None IMPRESSION: 1. Negative for bleed or other acute intracranial process. 2. Atrophy and nonspecific white matter changes. 3. 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 Electronically Signed   By: Lucrezia Europe M.D.   On: 06/03/2018 13:22   Ct Chest Wo Contrast  Result Date: 06/03/2018 CLINICAL DATA:  respiratory distress and AMS. EMS states patient discharged from [facility] today. Per staff patient potentially posturing. Pt on 4L [place] by EMS. Hx of DBM, HTN, PVD, CHF. EDP at bedside upon arrival and pt placed on Bipap upon arrival. CODE SEPSIS. TKV EXAM: CT CHEST WITHOUT CONTRAST TECHNIQUE: Multidetector CT imaging of the chest was performed following the standard protocol without IV contrast. COMPARISON:  None. FINDINGS: Cardiovascular: Mild  predominantly left-sided cardiomegaly. Small pericardial effusion. Borderline dilated central pulmonary arteries. Mitral annulus calcifications. Heavy aortic leaflet calcifications. Aortic Atherosclerosis (ICD10-170.0) without aneurysm. Mediastinum/Nodes: No hilar or mediastinal adenopathy. Lungs/Pleura: Consolidation/atelectasis posteriorly in both lower lobes, right worse than left, with some air bronchograms on the right. Small nodular areas of consolidation in the posterior right upper lobe and superior segment right lower lobe. No significant pleural effusion. No pneumothorax. Upper Abdomen: No acute findings. Musculoskeletal: No chest wall mass or suspicious bone lesions identified. IMPRESSION: 1. Asymmetric lower lobe consolidation right worse than left. 2. Nodular areas of consolidation in the right upper lung, presumably infectious/inflammatory. Non-contrast chest CT at 3-6 months is recommended to confirm appropriate resolution. If the nodules are stable at time of repeat CT, then future CT at 18-24 months (from today's scan) is considered optional for low-risk patients, but is recommended for high-risk patients. This recommendation follows the consensus statement: Guidelines for Management of Incidental Pulmonary Nodules Detected on CT  Images: From the Fleischner Society 2017; Radiology 2017; [phone]. 3. Heavy aortic leaflet calcifications. Correlate with any echocardiographic evidence of aortic stenosis or insufficiency. Electronically Signed   By: Lucrezia Europe M.D.   On: 06/03/2018 13:32    EKG: Orders placed or performed during the hospital encounter of 06/03/18  . EKG 12-Lead  . EKG 12-Lead  . ED EKG 12-Lead  . ED EKG 12-Lead    IMPRESSION AND PLAN:  *Acute on chronic respiratory failure with hypoxia and hypercapnia Secondary to healthcare associated pneumonia Sepsis and UTI  Currently on BiPAP, monitor in stepdown unit. Monitor per ICU physician.  *Healthcare associated  pneumonia Broad-spectrum coverage for now and follow cultures. Check for procalcitonin and MRSA screen.  *UTI Culture is sent, broad-spectrum antibiotics for now.  *Altered mental status, metabolic encephalopathy due to infections and respiratory failure CT head is negative, monitor.  *Chronic diastolic congestive heart failure Currently no exacerbation symptoms, will monitor.  *COPD Currently no active wheezing, continue with BiPAP in stepdown.  *Elevated troponin Will follow serial troponins.  *Elevated d-dimer Likely due to sepsis.  Continue monitor.   All the records are reviewed and case discussed with ED provider. Management plans discussed with the patient, family and they are in agreement.  CODE STATUS: Full code Code Status History    Date Active Date Inactive Code Status Order ID Comments User Context   04/10/2018 1437 04/17/2018 1946 Full Code [id number]  Loletha Grayer, MD ED   08/29/2016 0516 09/17/2016 1938 Full Code [id number]  Etta Quill, DO ED   07/05/2016 1755 07/09/2016 2131 Full Code [id number]  Shela Leff, MD Inpatient   03/01/2016 1524 03/13/2016 2119 Full Code [id number]  [name], MD Inpatient   03/01/2016 1450 03/01/2016 1524 Full Code [id number]  Marliss Coots, PA-C Inpatient   03/01/2016 1104 03/01/2016 1450 Full Code [id number]  Roland Earl, MD Inpatient   02/28/2016 1915 03/01/2016 1104 Full Code [id number]  Orbie Hurst Inpatient   02/12/2014 0708 02/13/2014 1850 Full Code [id number]  Oswald Hillock, MD Inpatient   02/08/2014 1317 02/10/2014 1758 Full Code [id number]  Wylene Simmer, MD Inpatient   02/03/2014 1459 02/08/2014 1317 Full Code [id number]  Radene Gunning, NP Inpatient      Patient's husband, brother, sister-in-law were present in the room during my visit. TOTAL TIME TAKING CARE OF THIS PATIENT: 50 minutes.    Vaughan Basta M.D on 06/03/2018   Between 7am to 6pm - Pager - [phone]  After 6pm go to [email or web address]  - password [email or web address] [facility]  Office  [phone]  CC: Primary care physician; [name], MD   Note: This dictation was prepared with Dragon dictation along with smaller phrase technology. Any transcriptional errors that result from this process are unintentional.

## 2018-06-03 NOTE — ED Notes (Signed)
Only able to obtain one set of blood cultures.  MD aware and okay without second set.

## 2018-06-03 NOTE — ED Provider Notes (Signed)
[facility] Emergency Department Provider Note   ____________________________________________   First MD Initiated Contact with Patient 06/03/18 1116     (approximate)  I have reviewed the triage vital signs and the nursing notes.   HISTORY  Chief Complaint Respiratory Distress  History obtained from EMS as patient is unable to provide any due to her decreased mental status  HPI [name] is a 77 y.o. female who comes from the wound center.  She is been treated for a diabetic ulcer.  She is supposed to be discharged from [facility] today.  At the wound center she was lethargic slow to respond seem to have some head-bobbing O2 sats were very low when up on 4 L of oxygen.  Here patient is very slow to respond she was taken off of 4 L of oxygen and put on 2 BiPAP her sats dropped into the 70s but quickly went up again on the BiPAP ultrasound shows good heart activity there are no search light seen on ultrasound of the lungs x-ray is pending   Past Medical History:  Diagnosis Date  . Acute heart failure ([place])   . Aortic stenosis   . CHF (congestive heart failure) ([place])   . Complication of anesthesia    Hard to wake patient up after having anesthesia  . Diabetes mellitus without complication ([place])   . Diabetic neuropathy (HCC)    Takes Lyrica  . Edema of both legs    Takes Lasix  . GERD (gastroesophageal reflux disease)   . High cholesterol   . HTN (hypertension)   . Hypertension   . PAD (peripheral artery disease) ([place])   . Shortness of breath dyspnea   . Spasm of back muscles   . Stroke ([place])   . Wound, open    Right great toe    Patient Active Problem List   Diagnosis Date Noted  . Acute cystitis 04/10/2018  . Sepsis due to Streptococcus, group B ([address])   . Multiple open wounds of lower leg   . PVD (peripheral vascular disease) ([place])   . Bacteremia 08/31/2016  . Sepsis ([place]) 08/29/2016  . Benign essential HTN   . Acute on chronic  diastolic heart failure ([place])   . Ulcer of right lower extremity ([place])   . Chest pain 07/05/2016  . Impacted cerumen of both ears 06/05/2016  . 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 Pleural effusion, bilateral   . Wheeze   . Dyspnea   . CHF (congestive heart failure) ([place])   . Right sided weakness   . Acute respiratory acidosis   . Surgery, elective   . Anemia, iron deficiency   . Leukocytosis   . Cerebrovascular accident (CVA) due to thrombosis of precerebral artery ([place]) 03/01/2016  . 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 PAD (peripheral artery disease) ([place]) 02/28/2016  . Atherosclerotic peripheral vascular disease with intermittent claudication ([place]) 12/08/2015  . Open toe wound 05/04/2014  . Anemia, chronic disease 04/27/2014  . Occult blood positive stool 04/17/2014  . Edema 04/03/2014  . Unspecified hereditary and idiopathic peripheral neuropathy 03/14/2014  . DM2 (diabetes mellitus, type 2) ([place]) 02/14/2014  . Altered mental status 02/12/2014  . Acute encephalopathy 02/12/2014  . Aortic stenosis 02/04/2014  . Closed left subtrochanteric femur fracture ([place]) 02/03/2014  . Hip fracture, left ([place]) 02/03/2014  . Hypokalemia 02/03/2014  . Fibula fracture 02/03/2014  . MTP instability 02/03/2014  . Hip fracture (  [place]) 02/03/2014  . HTN (hypertension)   . HLD (hyperlipidemia)     Past Surgical History:  Procedure Laterality Date  . BYPASS GRAFT POPLITEAL TO TIBIAL Right 02/28/2016   Procedure: BYPASS GRAFT RIGHT BELOW KNEE POPLITEAL TO PERONEAL USING REVERSED RIGHT GREATER SAPHENOUS VEIN;  Surgeon: Conrad Coalmont, MD;  Location: [place];  Service: Vascular;  Laterality: Right;  . CYST EXCISION     Abdomen  . EYE SURGERY Bilateral    Cataract removal  . INTRAMEDULLARY (IM) NAIL INTERTROCHANTERIC Left 02/04/2014   Procedure: INTRAMEDULLARY (IM) NAIL INTERTROCHANTRIC FEMORAL;  Surgeon: Mauri Pole, MD;  Location:  [place];  Service: Orthopedics;  Laterality: Left;  . IR GENERIC HISTORICAL  03/01/2016   IR ANGIO INTRA EXTRACRAN SEL COM CAROTID INNOMINATE UNI R MOD SED 03/01/2016 [name], MD [facility]  . IR GENERIC HISTORICAL  03/01/2016   IR ENDOVASC INTRACRANIAL INF OTHER THAN THROMBO ART INC DIAG ANGIO 03/01/2016 [name], MD [facility]  . IR GENERIC HISTORICAL  03/01/2016   IR INTRAVSC STENT CERV CAROTID W/O EMB-PROT MOD SED INC ANGIO 03/01/2016 [name], MD [facility]  . IR GENERIC HISTORICAL  06/03/2016   IR RADIOLOGIST EVAL & MGMT 06/03/2016 [facility]  . ORIF TOE FRACTURE Right 02/08/2014   Procedure: OPEN REDUCTION INTERNAL FIXATION Right METATARSAL  FRACTURE ;  Surgeon: Wylene Simmer, MD;  Location: [place];  Service: Orthopedics;  Laterality: Right;  . PERIPHERAL VASCULAR CATHETERIZATION N/A 12/28/2015   Procedure: Abdominal Aortogram w/Lower Extremity;  Surgeon: Conrad [place], MD;  Location: [place] [facility];  Service: Cardiovascular;  Laterality: N/A;  . RADIOLOGY WITH ANESTHESIA N/A 03/01/2016   Procedure: RADIOLOGY WITH ANESTHESIA;  Surgeon: [name], MD;  Location: [place];  Service: Radiology;  Laterality: N/A;  . VEIN HARVEST Right 02/28/2016   Procedure: RIGHT GREATER SAPHENOUS VEIN HARVEST;  Surgeon: Conrad , MD;  Location: [place];  Service: Vascular;  Laterality: Right;    Prior to Admission medications   Medication Sig Start Date End Date Taking? Authorizing Provider  acetaminophen (TYLENOL) 500 MG tablet Take 1,000 mg by mouth every 6 (six) hours as needed for mild pain.   Yes [provider]  albuterol (PROVENTIL HFA;VENTOLIN HFA) 108 (90 Base) MCG/ACT inhaler Inhale 2 puffs into the lungs every 6 (six) hours as needed for wheezing or shortness of breath.    Yes [provider]  aspirin 325 MG tablet Take 1 tablet (325 mg total) by mouth daily with breakfast. Patient taking differently: Take 325 mg by mouth daily.  07/10/16  Yes  Holley Raring, MD  atorvastatin (LIPITOR) 20 MG tablet Take 1 tablet (20 mg total) by mouth daily. 03/13/16  Yes Virgina Jock A, PA-C  carvedilol (COREG) 6.25 MG tablet Take 6.25 mg by mouth 2 (two) times daily.    Yes [provider]  cetirizine (ZYRTEC) 10 MG tablet Take 10 mg by mouth daily.    Yes [provider]  cholecalciferol (VITAMIN D) 1000 units tablet Take 2,000 Units by mouth daily.    Yes [provider]  omeprazole (PRILOSEC) 20 MG capsule Take 1 capsule (20 mg total) by mouth daily. 04/17/18  Yes Sainani, Belia Heman, MD  polyethylene glycol (MIRALAX / GLYCOLAX) packet Take 17 g by mouth 2 (two) times daily. 02/09/14  Yes Thurnell Lose, MD  potassium chloride SA (K-DUR,KLOR-CON) 20 MEQ tablet Take 20 mEq by mouth daily.   Yes [provider]  torsemide (DEMADEX) 20  MG tablet Take 2 tablets (40 mg total) by mouth 2 (two) times daily. 04/17/18  Yes Sainani, Belia Heman, MD  VITAMIN E PO Take 1 capsule by mouth daily.   Yes [provider]  clopidogrel (PLAVIX) 75 MG tablet Take 1 tablet (75 mg total) by mouth daily with breakfast. 03/04/17   Conrad Holden, MD  docusate sodium (COLACE) 100 MG capsule Take 100 mg by mouth 2 (two) times daily.    [provider]  fluticasone (FLONASE) 50 MCG/ACT nasal spray Place 1 spray into both nostrils daily.    [provider]  insulin aspart (NOVOLOG) 100 UNIT/ML injection Inject 0-15 Units into the skin 3 (three) times daily with meals. Before each meal 3 times a day, 140-199 - 3 units, 200-250 - 5 units, 251-299 - 8 units,  300-349 - 12 units,  350 or above 15 units. Patient taking differently: Inject 10 Units into the skin 3 (three) times daily with meals.  02/09/14   Thurnell Lose, MD  insulin glargine (LANTUS) 100 UNIT/ML injection Inject 0.27 mLs (27 Units total) into the skin at bedtime. Patient taking differently: Inject 32 Units into the skin at bedtime.  09/16/16   Reyne Dumas, MD    isosorbide dinitrate (ISORDIL) 30 MG tablet Take 30 mg by mouth daily.  04/16/17   [provider]  nitroGLYCERIN (NITROSTAT) 0.4 MG SL tablet Place 1 tablet (0.4 mg total) under the tongue every 5 (five) minutes as needed for chest pain. 07/09/16   Holley Raring, MD  pregabalin (LYRICA) 50 MG capsule Take 50 mg by mouth 3 (three) times daily.     [provider]    Allergies Iodine; Penicillins; Shellfish allergy; Shellfish-derived products; Sulfa antibiotics; Sulfacetamide sodium; Sulfasalazine; Atorvastatin; and Morphine and related  Family History  Problem Relation Age of Onset  . Diabetes Other   . Liver disease Mother   . CVA Father   . Diabetes Father   . Hypertension Father     Social History Social History   Tobacco Use  . Smoking status: Never Smoker  . Smokeless tobacco: Never Used  . Tobacco comment: Never smoked  Substance Use Topics  . Alcohol use: No    Alcohol/week: 0.0 standard drinks  . Drug use: No    Review of Systems Unobtainable due to mental status  ____________________________________________   PHYSICAL EXAM:  VITAL SIGNS: ED Triage Vitals  Enc Vitals Group     BP 06/03/18 1113 (!) 152/49     Pulse Rate 06/03/18 1113 90     Resp 06/03/18 1113 16     Temp --      Temp src --      SpO2 06/03/18 1113 100 %     Weight --      Height --      Head Circumference --      Peak Flow --      Pain Score 06/03/18 1114 0     Pain Loc --      Pain Edu? --      Excl. in [place]? --     Constitutional: Obtunded arouses slightly with sternal rub moves her arms and goes back to sleep Eyes: Conjunctivae are normal. PERRL. EOMI. Head: Atraumatic. Nose: No congestion/rhinnorhea. Mouth/Throat: Mucous membranes are moist.  Oropharynx non-erythematous. Neck: No stridor.  No JVD  cardiovascular: Normal rate, regular rhythm. Grossly normal heart sounds.  Good peripheral circulation. Respiratory: Normal respiratory effort.  No retractions.  Lungs decreased breath  sounds throughout Gastrointestinal: Soft and nontender. No distention. No abdominal bruits. No CVA tenderness. Musculoskeletal: Leg edema this appears to be chronic based on her history of multiple scars on the right leg    Neurologic: Obtunded Skin:  Skin is warm, dry and intact. No rash noted. Psychiatric: Mood and affect are normal. Speech and behavior are normal.  ____________________________________________   LABS (all labs ordered are listed, but only abnormal results are displayed)  Labs Reviewed  COMPREHENSIVE METABOLIC PANEL - Abnormal; Notable for the following components:      Result Value   Sodium 146 (*)    Potassium 5.3 (*)    Chloride 97 (*)    CO2 36 (*)    Glucose, Bld 340 (*)    BUN 36 (*)    Creatinine, Ser 1.08 (*)    Albumin 3.0 (*)    Total Bilirubin 1.6 (*)    GFR calc non Af Amer 49 (*)    GFR calc Af Amer 57 (*)    All other components within normal limits  BRAIN NATRIURETIC PEPTIDE - Abnormal; Notable for the following components:   B Natriuretic Peptide 625.0 (*)    All other components within normal limits  TROPONIN I - Abnormal; Notable for the following components:   Troponin I 0.65 (*)    All other components within normal limits  CBC WITH DIFFERENTIAL/PLATELET - Abnormal; Notable for the following components:   WBC 16.8 (*)    Hemoglobin 11.3 (*)    MCHC 28.1 (*)    RDW 15.7 (*)    Neutro Abs 14.7 (*)    Lymphs Abs 0.6 (*)    Monocytes Absolute 1.4 (*)    Abs Immature Granulocytes 0.09 (*)    All other components within normal limits  URINALYSIS, COMPLETE (UACMP) WITH MICROSCOPIC - Abnormal; Notable for the following components:   Color, Urine YELLOW (*)    APPearance CLOUDY (*)    Glucose, UA >=500 (*)    Hgb urine dipstick MODERATE (*)    Ketones, ur 20 (*)    Protein, ur 30 (*)    Leukocytes, UA LARGE (*)    WBC, UA >50 (*)    Bacteria, UA RARE (*)    All other components within normal limits  BLOOD GAS,  ARTERIAL - Abnormal; Notable for the following components:   pCO2 arterial 81 (*)    pO2, Arterial 112 (*)    Bicarbonate 45.8 (*)    Acid-Base Excess 16.7 (*)    All other components within normal limits  FIBRIN DERIVATIVES D-DIMER ([facility] ONLY) - Abnormal; Notable for the following components:   Fibrin derivatives D-dimer ([facility]) 706.51 (*)    All other components within normal limits  CULTURE, BLOOD (ROUTINE X 2)  CULTURE, BLOOD (ROUTINE X 2)  LACTIC ACID, PLASMA  LACTIC ACID, PLASMA   ____________________________________________  EKG  EKG read interpreted by me shows normal sinus rhythm rate of 94 normal axis there are flipped T waves inferiorly and laterally in the chest leads but these are old compared to the last 2 EKG starting in January of last year. ____________________________________________  [employer]  ED MD interpretation:   Official radiology report(s): Dg Chest 1 View  Result Date: 06/03/2018 CLINICAL DATA:  Respiratory distress and altered mental status. EXAM: CHEST  1 VIEW COMPARISON:  04/13/2018. FINDINGS: Again noted is moderate cardiac enlargement. Bilateral pleural effusions and pulmonary vascular congestion noted. No airspace consolidation. IMPRESSION: 1. Suspect mild congestive heart failure. Electronically Signed  By: Kerby Moors M.D.   On: 06/03/2018 11:40   Ct Head Wo Contrast  Result Date: 06/03/2018 CLINICAL DATA:  respiratory distress and AMS. EMS states patient discharged from [facility] today. Per staff patient potentially posturing. Pt on 4L [place] by EMS. Hx of DBM, HTN, PVD, CHF. EDP at bedside upon arrival and pt placed on Bipap upon arrival. CODE SEPSIS. TKV EXAM: CT HEAD WITHOUT CONTRAST TECHNIQUE: Contiguous axial images were obtained from the base of the skull through the vertex without intravenous contrast. COMPARISON:  04/13/2018 and previous FINDINGS: Brain: Diffuse parenchymal atrophy. Patchy areas of hypoattenuation in deep and  periventricular white matter bilaterally. Negative for acute intracranial hemorrhage, mass lesion, acute infarction, midline shift, or mass-effect. Acute infarct may be inapparent on noncontrast CT. Ventricles and sulci symmetric. Chronic bilateral basal ganglia mineralization. Vascular: Atherosclerotic and physiologic intracranial calcifications. Skull: Normal. Negative for fracture .1 cm calcified extra-axial lesion in the posterior cranial fossa on the right, stable since previous. Sinuses/Orbits: No acute finding. Other: None IMPRESSION: 1. Negative for bleed or other acute intracranial process. 2. Atrophy and nonspecific white matter changes. 3. 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 Electronically Signed   By: Lucrezia Europe M.D.   On: 06/03/2018 13:22   Ct Chest Wo Contrast  Result Date: 06/03/2018 CLINICAL DATA:  respiratory distress and AMS. EMS states patient discharged from [facility] today. Per staff patient potentially posturing. Pt on 4L [place] by EMS. Hx of DBM, HTN, PVD, CHF. EDP at bedside upon arrival and pt placed on Bipap upon arrival. CODE SEPSIS. TKV EXAM: CT CHEST WITHOUT CONTRAST TECHNIQUE: Multidetector CT imaging of the chest was performed following the standard protocol without IV contrast. COMPARISON:  None. FINDINGS: Cardiovascular: Mild predominantly left-sided cardiomegaly. Small pericardial effusion. Borderline dilated central pulmonary arteries. Mitral annulus calcifications. Heavy aortic leaflet calcifications. Aortic Atherosclerosis (ICD10-170.0) without aneurysm. Mediastinum/Nodes: No hilar or mediastinal adenopathy. Lungs/Pleura: Consolidation/atelectasis posteriorly in both lower lobes, right worse than left, with some air bronchograms on the right. Small nodular areas of consolidation in the posterior right upper lobe and superior segment right lower lobe. No significant pleural effusion. No  pneumothorax. Upper Abdomen: No acute findings. Musculoskeletal: No chest wall mass or suspicious bone lesions identified. IMPRESSION: 1. Asymmetric lower lobe consolidation right worse than left. 2. Nodular areas of consolidation in the right upper lung, presumably infectious/inflammatory. Non-contrast chest CT at 3-6 months is recommended to confirm appropriate resolution. If the nodules are stable at time of repeat CT, then future CT at 18-24 months (from today's scan) is considered optional for low-risk patients, but is recommended for high-risk patients. This recommendation follows the consensus statement: Guidelines for Management of Incidental Pulmonary Nodules Detected on CT Images: From the Fleischner Society 2017; Radiology 2017; 284:228-243. 3. Heavy aortic leaflet calcifications. Correlate with any echocardiographic evidence of aortic stenosis or insufficiency. Electronically Signed   By: Lucrezia Europe M.D.   On: 06/03/2018 13:32    ____________________________________________   PROCEDURES  Procedure(s) performed:   Procedures  Critical Care performed: Critical care time 1 hour.  This includes speaking with the family being at bedside for quite some time reviewing the patient's old records reviewing this patient's studies discussing patient with hospitalist and formally getting a plan of action.  ____________________________________________   INITIAL IMPRESSION / ASSESSMENT AND PLAN / ED COURSE Discussed patient with her family and review the old records patient had altered mental status last time with a  CO2 of 79.  At 66 now this may explain everything.  She does have elevated BNP and what appears to be CHF on chest x-ray her troponin is elevated as well although her EKG does not show an acute MI this could be an end STEMI I will give her aspirin.  She does take aspirin every day anyway.  Family reports last 2 to 3 days she has been increasingly lethargic and out of it.  Treat her for her  UTI her CHF and continue from there.  Patient has an elevated BNP and is edematous at the present time I will not give her fluids given her she probably is septic.  Do not want to make her lung function worse.  Discussed the patient with Dr. Eduard Clos.  He agrees.          ____________________________________________   FINAL CLINICAL IMPRESSION(S) / ED DIAGNOSES  Final diagnoses:  COPD exacerbation ([place])  Systolic congestive heart failure, unspecified HF chronicity ([place])  Hypoxia  Urinary tract infection with hematuria, site unspecified  Altered mental status, unspecified altered mental status type  Community acquired pneumonia, unspecified laterality     ED Discharge Orders    None       Note:  This document was prepared using Dragon voice recognition software and may include unintentional dictation errors.    Nena Polio, MD 06/03/18 1352

## 2018-06-03 NOTE — Progress Notes (Signed)
Pharmacy Antibiotic Note  [name] is a 77 y.o. female admitted on 06/03/2018 with pneumonia and sepsis.  Pharmacy has been consulted for cefepime and vancomycin dosing.  Plan: 1. Cefepime 2 gm IV Q12H 2. Vancomycin 1 gm IV x 1 in ED followed in approximately 6 hours (stacked dosing) by vancomycin 1.25 gm IV Q18H, predicted trough 17 mcg/ml. Pharmacy will continue to follow and adjust as needed to maintain trough 15 to 20 mcg/ml.   Vd 53.3 L, Ke 0.049 hr-1, T1/2 14 hr  Weight: 238 lb 15.7 oz (108.4 kg)  Temp (24hrs), Avg:100 F (37.8 C), Min:100 F (37.8 C), Max:100 F (37.8 C)  Recent Labs  Lab 06/03/18 1122  WBC 16.8*  CREATININE 1.08*  LATICACIDVEN 1.9    Estimated Creatinine Clearance: 54.1 mL/min (A) (by C-G formula based on SCr of 1.08 mg/dL (H)).    Allergies  Allergen Reactions  . Iodine Anaphylaxis  . Penicillins Anaphylaxis    Tolerates ceftriaxone, cefazolin Has patient had a PCN reaction causing immediate rash, facial/tongue/throat swelling, SOB or lightheadedness with hypotension: Unknown Has patient had a PCN reaction causing severe rash involving mucus membranes or skin necrosis: Unknown Has patient had a PCN reaction that required hospitalization: Unknown Has patient had a PCN reaction occurring within the last 10 years: Unknown If all of the above answers are "NO", then may proceed with Cephalosporin u  . Shellfish Allergy Anaphylaxis  . Shellfish-Derived Products Anaphylaxis  . Sulfa Antibiotics Anaphylaxis  . Sulfacetamide Sodium Anaphylaxis  . Sulfasalazine Anaphylaxis  . Morphine And Related Other (See Comments)    Altered mental status    Antimicrobials this admission: Levofloxacin 750 mg IV x 1 in ED  Dose adjustments this admission:   Microbiology results:  BCx:   UCx:    Sputum:    MRSA PCR:   Thank you for allowing pharmacy to be a part of this patient's care.  Laural Benes, Pharm.D., BCPS Clinical Pharmacist 06/03/2018  3:17 PM

## 2018-06-03 NOTE — Consult Note (Addendum)
Name: [name] MRN: [id number] DOB: [birth year]    ADMISSION DATE:  06/03/2018 CONSULTATION DATE: 06/03/2018  REFERRING MD : Dr. Anselm Jungling   CHIEF COMPLAINT: Shortness of Breath   BRIEF PATIENT DESCRIPTION:  77 yo female admitted with sepsis secondary to UTI and pneumonia and acute on chronic hypercapnic respiratory failure secondary to pneumonia and pulmonary edema requiring Bipap   SIGNIFICANT EVENTS/STUDIES:  10/30-Pt admitted to stepdown unit on Bipap   10/30-CT Head revealed negative for bleed or other acute intracranial process. Atrophy and nonspecific white matter changes. 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 10/30-CT Chest revealed Asymmetric lower lobe consolidation right worse than left. Nodular areas of consolidation in the right upper lung, presumably infectious/inflammatory. Non-contrast chest CT at 3-6 months is recommended to confirm appropriate resolution. If the nodules are stable at time of repeat CT, then future CT at 18-24 months (from today's scan) is considered optional for low-risk patients, but is recommended for high-risk patients. This recommendation follows the consensus statement: Guidelines for Management of Incidental Pulmonary Nodules Detected on CT Images: From the Fleischner Society 2017; Radiology 2017; 284:228-243. Heavy aortic leaflet calcifications. Correlate with any echocardiographic evidence of aortic stenosis or insufficiency  HISTORY OF PRESENT ILLNESS:   This is a 77 yo female with a PMH of Stroke, Severe PAD, Right Diabetic Foot Ulcer (managed by wound care clinic, podiatry, and infectious disease), HTN, Hypercholesteremia, GERD, Diabetic Neuropathy, Type II Diabetes Mellitus, CVA, Acute on Chronic Diastolic CHF, and Aortic Stenosis.  She presented to [facility] ER on 10/30 via EMS from the wound care clinic with respiratory distress, altered mental status, and concern of pt  posturing.  Per ER notes she was discharged from [facility] today 06/03/18. Today at the wound care center she was lethargic, slow to respond, and hypoxic in respiratory distress prompting EMS notification. Upon arrival to the ER the pt was on 4L O2 via nasal canula, however she remained hypoxic requiring Bipap.  CT Chest revealed asymmetric lower lobe consolidation right worse than left and UA revealed UTI.  Lab results revealed Na+ 146, K+ 5.3, glucose 340, BUN 36, creatinine 1.08, total bilirubin 1.6, BNP 625, troponin 0.65, wbc 16.8, and d-dimer 706.51.  She received iv cefepime, vancomycin, and levaquin.  She was subsequently admitted to the stepdown unit by hospitalist team for additional workup and treatment.    PAST MEDICAL HISTORY :   has a past medical history of Acute heart failure ([place]), Aortic stenosis, CHF (congestive heart failure) ([place]), Complication of anesthesia, Diabetes mellitus without complication ([place]), Diabetic neuropathy ([place]), Edema of both legs, GERD (gastroesophageal reflux disease), High cholesterol, HTN (hypertension), Hypertension, PAD (peripheral artery disease) ([place]), Shortness of breath dyspnea, Spasm of back muscles, Stroke ([place]), and Wound, open.  has a past surgical history that includes Intramedullary (im) nail intertrochanteric (Left, 02/04/2014); ORIF toe fracture (Right, 02/08/2014); Cardiac catheterization (N/A, 12/28/2015); Cyst excision; Eye surgery (Bilateral); Bypass graft popliteal to tibial (Right, 02/28/2016); Vein harvest (Right, 02/28/2016); Radiology with anesthesia (N/A, 03/01/2016); ir generic historical (03/01/2016); ir generic historical (03/01/2016); ir generic historical (03/01/2016); and ir generic historical (06/03/2016). Prior to Admission medications   Medication Sig Start Date End Date Taking? Authorizing Provider  acetaminophen (TYLENOL) 500 MG tablet Take 1,000 mg by mouth every 6 (six) hours as needed for mild pain.   Yes [provider]   albuterol (PROVENTIL HFA;VENTOLIN HFA) 108 (90 Base) MCG/ACT inhaler Inhale 2 puffs into the lungs every 6 (six)  hours as needed for wheezing or shortness of breath.    Yes [provider]  aspirin 325 MG tablet Take 1 tablet (325 mg total) by mouth daily with breakfast. Patient taking differently: Take 325 mg by mouth daily.  07/10/16  Yes Holley Raring, MD  atorvastatin (LIPITOR) 20 MG tablet Take 1 tablet (20 mg total) by mouth daily. 03/13/16  Yes Virgina Jock A, PA-C  carvedilol (COREG) 6.25 MG tablet Take 6.25 mg by mouth 2 (two) times daily.    Yes [provider]  cetirizine (ZYRTEC) 10 MG tablet Take 10 mg by mouth daily.    Yes [provider]  cholecalciferol (VITAMIN D) 1000 units tablet Take 2,000 Units by mouth daily.    Yes [provider]  clopidogrel (PLAVIX) 75 MG tablet Take 1 tablet (75 mg total) by mouth daily with breakfast. 03/04/17  Yes Conrad [place], MD  docusate sodium (COLACE) 100 MG capsule Take 100 mg by mouth 2 (two) times daily.   Yes [provider]  fluticasone (FLONASE) 50 MCG/ACT nasal spray Place 1 spray into both nostrils every evening.    Yes [provider]  insulin aspart (NOVOLOG) 100 UNIT/ML injection Inject 0-15 Units into the skin 3 (three) times daily with meals. Before each meal 3 times a day, 140-199 - 3 units, 200-250 - 5 units, 251-299 - 8 units,  300-349 - 12 units,  350 or above 15 units. Patient taking differently: Inject 14 Units into the skin 3 (three) times daily with meals.  02/09/14  Yes Thurnell Lose, MD  insulin glargine (LANTUS) 100 UNIT/ML injection Inject 0.27 mLs (27 Units total) into the skin at bedtime. Patient taking differently: Inject 40 Units into the skin at bedtime.  09/16/16  Yes Reyne Dumas, MD  isosorbide dinitrate (ISORDIL) 30 MG tablet Take 30 mg by mouth daily.  04/16/17  Yes [provider]  nitroGLYCERIN (NITROSTAT) 0.4 MG SL tablet Place 1 tablet (0.4 mg  total) under the tongue every 5 (five) minutes as needed for chest pain. 07/09/16  Yes Holley Raring, MD  nystatin ointment (MYCOSTATIN) Apply 1 application topically 3 (three) times daily. Apply to area below breasts   Yes [provider]  omeprazole (PRILOSEC) 20 MG capsule Take 1 capsule (20 mg total) by mouth daily. 04/17/18  Yes Sainani, Belia Heman, MD  polyethylene glycol (MIRALAX / GLYCOLAX) packet Take 17 g by mouth 2 (two) times daily. 02/09/14  Yes Thurnell Lose, MD  potassium chloride SA (K-DUR,KLOR-CON) 20 MEQ tablet Take 20 mEq by mouth daily.   Yes [provider]  pregabalin (LYRICA) 50 MG capsule Take 50 mg by mouth 2 (two) times daily.    Yes [provider]  torsemide (DEMADEX) 20 MG tablet Take 2 tablets (40 mg total) by mouth 2 (two) times daily. 04/17/18  Yes Sainani, Belia Heman, MD  VITAMIN E PO Take 1 capsule by mouth daily.   Yes [provider]   Allergies  Allergen Reactions  . Iodine Anaphylaxis  . Penicillins Anaphylaxis    Tolerates ceftriaxone, cefazolin Has patient had a PCN reaction causing immediate rash, facial/tongue/throat swelling, SOB or lightheadedness with hypotension: Unknown Has patient had a PCN reaction causing severe rash involving mucus membranes or skin necrosis: Unknown Has patient had a PCN reaction that required hospitalization: Unknown Has patient had a PCN reaction occurring within the last 10 years: Unknown If all of the above answers are "NO", then may proceed with Cephalosporin u  .  Shellfish Allergy Anaphylaxis  . Shellfish-Derived Products Anaphylaxis  . Sulfa Antibiotics Anaphylaxis  . Sulfacetamide Sodium Anaphylaxis  . Sulfasalazine Anaphylaxis  . Morphine And Related Other (See Comments)    Altered mental status    FAMILY HISTORY:  family history includes CVA in her father; Diabetes in her father and other; Hypertension in her father; Liver disease in her mother. SOCIAL HISTORY:  reports that she  has never smoked. She has never used smokeless tobacco. She reports that she does not drink alcohol or use drugs.  REVIEW OF SYSTEMS:  Unable to assess pt lethargic on Bipap   SUBJECTIVE:  Unable to assess pt lethargic on Bipap  VITAL SIGNS: Temp:  [100 F (37.8 C)] 100 F (37.8 C) (10/30 1125) Pulse Rate:  [81-94] 83 (10/30 1445) Resp:  [12-23] 16 (10/30 1445) BP: (136-163)/(43-62) 154/46 (10/30 1445) SpO2:  [100 %] 100 % (10/30 1445) Weight:  [108.4 kg] 108.4 kg (10/30 1500)  PHYSICAL EXAMINATION: General: acutely ill appearing female, NAD on Bipap  Neuro: lethargic, not following commands, PERRL HEENT: supple, no JVD  Cardiovascular: nsr, rrr, no R/G  Lungs: diminished throughout, even, non labored  Abdomen: +BS x4, obese, soft, non distended  Musculoskeletal: 1+ bilateral lower extremity edema Skin: healing diabetic ulceration present over the dorsal first metatarsal phalangeal joint no drainage or erythema present   Recent Labs  Lab 06/03/18 1122  NA 146*  K 5.3*  CL 97*  CO2 36*  BUN 36*  CREATININE 1.08*  GLUCOSE 340*   Recent Labs  Lab 06/03/18 1122  HGB 11.3*  HCT 40.2  WBC 16.8*  PLT 156   Dg Chest 1 View  Result Date: 06/03/2018 CLINICAL DATA:  Respiratory distress and altered mental status. EXAM: CHEST  1 VIEW COMPARISON:  04/13/2018. FINDINGS: Again noted is moderate cardiac enlargement. Bilateral pleural effusions and pulmonary vascular congestion noted. No airspace consolidation. IMPRESSION: 1. Suspect mild congestive heart failure. Electronically Signed   By: Kerby Moors M.D.   On: 06/03/2018 11:40   Ct Head Wo Contrast  Result Date: 06/03/2018 CLINICAL DATA:  respiratory distress and AMS. EMS states patient discharged from [facility] today. Per staff patient potentially posturing. Pt on 4L [place] by EMS. Hx of DBM, HTN, PVD, CHF. EDP at bedside upon arrival and pt placed on Bipap upon arrival. CODE SEPSIS. TKV EXAM: CT HEAD WITHOUT  CONTRAST TECHNIQUE: Contiguous axial images were obtained from the base of the skull through the vertex without intravenous contrast. COMPARISON:  04/13/2018 and previous FINDINGS: Brain: Diffuse parenchymal atrophy. Patchy areas of hypoattenuation in deep and periventricular white matter bilaterally. Negative for acute intracranial hemorrhage, mass lesion, acute infarction, midline shift, or mass-effect. Acute infarct may be inapparent on noncontrast CT. Ventricles and sulci symmetric. Chronic bilateral basal ganglia mineralization. Vascular: Atherosclerotic and physiologic intracranial calcifications. Skull: Normal. Negative for fracture .1 cm calcified extra-axial lesion in the posterior cranial fossa on the right, stable since previous. Sinuses/Orbits: No acute finding. Other: None IMPRESSION: 1. Negative for bleed or other acute intracranial process. 2. Atrophy and nonspecific white matter changes. 3. 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 Electronically Signed   By: Lucrezia Europe M.D.   On: 06/03/2018 13:22   Ct Chest Wo Contrast  Result Date: 06/03/2018 CLINICAL DATA:  respiratory distress and AMS. EMS states patient discharged from [facility] today. Per staff patient potentially posturing. Pt on 4L [place] by EMS. Hx of DBM, HTN, PVD, CHF.  EDP at bedside upon arrival and pt placed on Bipap upon arrival. CODE SEPSIS. TKV EXAM: CT CHEST WITHOUT CONTRAST TECHNIQUE: Multidetector CT imaging of the chest was performed following the standard protocol without IV contrast. COMPARISON:  None. FINDINGS: Cardiovascular: Mild predominantly left-sided cardiomegaly. Small pericardial effusion. Borderline dilated central pulmonary arteries. Mitral annulus calcifications. Heavy aortic leaflet calcifications. Aortic Atherosclerosis (ICD10-170.0) without aneurysm. Mediastinum/Nodes: No hilar or mediastinal adenopathy. Lungs/Pleura:  Consolidation/atelectasis posteriorly in both lower lobes, right worse than left, with some air bronchograms on the right. Small nodular areas of consolidation in the posterior right upper lobe and superior segment right lower lobe. No significant pleural effusion. No pneumothorax. Upper Abdomen: No acute findings. Musculoskeletal: No chest wall mass or suspicious bone lesions identified. IMPRESSION: 1. Asymmetric lower lobe consolidation right worse than left. 2. Nodular areas of consolidation in the right upper lung, presumably infectious/inflammatory. Non-contrast chest CT at 3-6 months is recommended to confirm appropriate resolution. If the nodules are stable at time of repeat CT, then future CT at 18-24 months (from today's scan) is considered optional for low-risk patients, but is recommended for high-risk patients. This recommendation follows the consensus statement: Guidelines for Management of Incidental Pulmonary Nodules Detected on CT Images: From the Fleischner Society 2017; Radiology 2017; 284:228-243. 3. Heavy aortic leaflet calcifications. Correlate with any echocardiographic evidence of aortic stenosis or insufficiency. Electronically Signed   By: Lucrezia Europe M.D.   On: 06/03/2018 13:32    ASSESSMENT / PLAN:  Acute on chronic hypercapnic respiratory failure secondary to pneumonia and pulmonary edema  Hx: COPD and Sleep Apnea  Prn and qhs Bipap for dyspnea and hypoxia  Scheduled and prn bronchodilator therapy  Nebulized steroids  Repeat ABG  Repeat CXR in am  Will likely need sleep study and CPAP in outpatient setting   Acute on chronic diastolic CHF exacerbation  Elevated troponin secondary to NSTEMI vs. demand ischemia in setting of respiratory failure  Hx: Aortic Stenosis, Severe PAD, HTN, and CVA Continuous telemetry monitoring  Trend troponin's  Continue outpatient aspirin, atorvastatin, and plavix  Acute on chronic renal failure with hyperkalemia secondary to sepsis  Trend  BMP Replace electrolytes as indicated Monitor UOP  Avoid nephrotoxic medications   GERD Continue protonix   Sepsis secondary to pneumonia and UTI  Trend WBC and monitor fever curve Trend PCT Follow cultures Continue vancomycin and cefepime   Anemia without obvious acute blood loss VTE px: subq heparin  Trend CBC  Monitor for s/sx of bleeding and transfuse for hgb <7  Type II Diabetes Mellitus  CBG's q4hrs SSI   Acute encephalopathy secondary to hypercapnia  Attempt to avoid sedating medication   Marda Stalker, [place] Pager [phone] (please enter 7 digits) PCCM Consult Pager [phone] (please enter 7 digits)

## 2018-06-03 NOTE — Progress Notes (Signed)
Family Meeting Note  Advance Directive:no  Today a meeting took place with the spouse.  Patient is unable to participate due LK:HVFMBB capacity drowsy   The following clinical team members were present during this meeting:MD  The following were discussed:Patient's diagnosis: Acute on chronic respiratory failure, pneumonia, UTI, Patient's progosis: Unable to determine and Goals for treatment: Full Code  Additional follow-up to be provided: Intensivist  Time spent during discussion:20 minutes  Vaughan Basta, MD

## 2018-06-03 NOTE — ED Triage Notes (Addendum)
Pt to ED via EMS emergency traffic from wound care clinic for respiratory distress and AMS.  EMS states patient discharged from [facility] today.  Per staff patient potentially posturing.  Pt on 4L [place] by EMS.  Hx of DBM, HTN, PVD, CHF.  EDP at bedside upon arrival and pt placed on Bipap upon arrival.

## 2018-06-03 NOTE — Progress Notes (Signed)
Pt's follow up ABG with at 1952 with Respiratory acidosis and concomitant Metabolic alkalosis (4.58 / 89 / 97 / 51.5).  Orders given to respiratory therapy to increase BiPAP settings.  Pt is unable to receive Diamox due to Allergy to sulfa (anaphylactic reaction).  Will recheck ABG at 0000.   Darel Hong, AGACNP-BC [facility] Pager: [phone] Cell: [phone]

## 2018-06-03 NOTE — Consult Note (Addendum)
CODE SEPSIS - PHARMACY COMMUNICATION  **Broad Spectrum Antibiotics should be administered within 1 hour of Sepsis diagnosis**  Time Code Sepsis Called/Page Received: 11:59 AM  Antibiotics Ordered: Levofloxacin 750 mg IV  Time of 1st antibiotic administration:12:35  Additional action taken by pharmacy: Called  If necessary, Name of Provider/Nurse Contacted: Perley Jain, PharmD Pharmacy Resident  06/03/2018 2:29 PM

## 2018-06-04 ENCOUNTER — Inpatient Hospital Stay: Payer: Medicare HMO

## 2018-06-04 DIAGNOSIS — J9622 Acute and chronic respiratory failure with hypercapnia: Secondary | ICD-10-CM

## 2018-06-04 LAB — BLOOD GAS, ARTERIAL
ACID-BASE EXCESS: 16.4 mmol/L — AB (ref 0.0–2.0)
Bicarbonate: 48 mmol/L — ABNORMAL HIGH (ref 20.0–28.0)
DELIVERY SYSTEMS: POSITIVE
Expiratory PAP: 8
FIO2: 0.35
INSPIRATORY PAP: 18
Mechanical Rate: 14
O2 Saturation: 98.1 %
PO2 ART: 108 mmHg (ref 83.0–108.0)
Patient temperature: 37
pCO2 arterial: 83 mmHg (ref 32.0–48.0)
pH, Arterial: 7.37 (ref 7.350–7.450)

## 2018-06-04 LAB — GLUCOSE, CAPILLARY
GLUCOSE-CAPILLARY: 255 mg/dL — AB (ref 70–99)
GLUCOSE-CAPILLARY: 284 mg/dL — AB (ref 70–99)
GLUCOSE-CAPILLARY: 357 mg/dL — AB (ref 70–99)
GLUCOSE-CAPILLARY: 296 mg/dL — AB (ref 70–99)
Glucose-Capillary: 278 mg/dL — ABNORMAL HIGH (ref 70–99)

## 2018-06-04 LAB — CBC
HEMATOCRIT: 37.5 % (ref 36.0–46.0)
Hemoglobin: 10.9 g/dL — ABNORMAL LOW (ref 12.0–15.0)
MCH: 26.8 pg (ref 26.0–34.0)
MCHC: 29.1 g/dL — AB (ref 30.0–36.0)
MCV: 92.1 fL (ref 80.0–100.0)
Platelets: 151 10*3/uL (ref 150–400)
RBC: 4.07 MIL/uL (ref 3.87–5.11)
RDW: 15.4 % (ref 11.5–15.5)
WBC: 14.2 10*3/uL — ABNORMAL HIGH (ref 4.0–10.5)
nRBC: 0 % (ref 0.0–0.2)

## 2018-06-04 LAB — BASIC METABOLIC PANEL
Anion gap: 12 (ref 5–15)
BUN: 41 mg/dL — AB (ref 8–23)
CHLORIDE: 100 mmol/L (ref 98–111)
CO2: 39 mmol/L — AB (ref 22–32)
CREATININE: 0.89 mg/dL (ref 0.44–1.00)
Calcium: 9.7 mg/dL (ref 8.9–10.3)
GFR calc Af Amer: >60 mL/min (ref >60)
GFR calc non Af Amer: >60 mL/min (ref >60)
GLUCOSE: 271 mg/dL — AB (ref 70–99)
Potassium: 4.3 mmol/L (ref 3.5–5.1)
Sodium: 151 mmol/L — ABNORMAL HIGH (ref 135–145)

## 2018-06-04 LAB — PROCALCITONIN: Procalcitonin: 2.72 ng/mL

## 2018-06-04 LAB — TROPONIN I: Troponin I: 0.79 ng/mL (ref <0.03)

## 2018-06-04 MED ORDER — HYDRALAZINE HCL 20 MG/ML IJ SOLN
10.0000 mg | Freq: Four times a day (QID) | INTRAMUSCULAR | Status: DC | PRN
  Administered 2018-06-04: 20 mg via INTRAVENOUS
  Filled 2018-06-04 (×2): qty 1

## 2018-06-04 MED ORDER — DEXTROSE 5 % IV SOLN: INTRAVENOUS | Status: DC

## 2018-06-04 MED ORDER — NICARDIPINE HCL IN NACL 20-0.86 MG/200ML-% IV SOLN
3.0000 mg/h | INTRAVENOUS | Status: DC
  Administered 2018-06-04: 3 mg/h via INTRAVENOUS
  Filled 2018-06-04: qty 200

## 2018-06-04 MED ORDER — SODIUM CHLORIDE 0.9 % IV SOLN
INTRAVENOUS | Status: DC | PRN
  Administered 2018-06-04 – 2018-06-08 (×5): 500 mL via INTRAVENOUS

## 2018-06-04 MED ORDER — INSULIN GLARGINE 100 UNIT/ML ~~LOC~~ SOLN
30.0000 [IU] | Freq: Every day | SUBCUTANEOUS | Status: DC
  Administered 2018-06-04 – 2018-06-05 (×2): 30 [IU] via SUBCUTANEOUS
  Filled 2018-06-04 (×3): qty 0.3

## 2018-06-04 MED ORDER — ORAL CARE MOUTH RINSE
15.0000 mL | Freq: Two times a day (BID) | OROMUCOSAL | Status: DC
  Administered 2018-06-04 – 2018-06-10 (×11): 15 mL via OROMUCOSAL

## 2018-06-04 NOTE — Progress Notes (Signed)
PHARMACY - PHYSICIAN COMMUNICATION CRITICAL VALUE ALERT - BLOOD CULTURE IDENTIFICATION (BCID)  CATALEYA CRISTINA is an 77 y.o. female who presented to [facility] on 06/03/2018 with a chief complaint of   Assessment:  1/4 GPR (include suspected source if known)  Name of physician (or Provider) Contacted: Dewaine Conger  Current antibiotics: cefepime/vanc  Changes to prescribed antibiotics recommended:  n/a  Results for orders placed or performed during the hospital encounter of 08/28/16  Blood Culture ID Panel (Reflexed) (Collected: 08/29/2016  1:27 AM)  Result Value Ref Range   Enterococcus species NOT DETECTED NOT DETECTED   Listeria monocytogenes NOT DETECTED NOT DETECTED   Staphylococcus species NOT DETECTED NOT DETECTED   Staphylococcus aureus (BCID) NOT DETECTED NOT DETECTED   Streptococcus species DETECTED (A) NOT DETECTED   Streptococcus agalactiae DETECTED (A) NOT DETECTED   Streptococcus pneumoniae NOT DETECTED NOT DETECTED   Streptococcus pyogenes NOT DETECTED NOT DETECTED   Acinetobacter baumannii NOT DETECTED NOT DETECTED   Enterobacteriaceae species NOT DETECTED NOT DETECTED   Enterobacter cloacae complex NOT DETECTED NOT DETECTED   Escherichia coli NOT DETECTED NOT DETECTED   Klebsiella oxytoca NOT DETECTED NOT DETECTED   Klebsiella pneumoniae NOT DETECTED NOT DETECTED   Proteus species NOT DETECTED NOT DETECTED   Serratia marcescens NOT DETECTED NOT DETECTED   Haemophilus influenzae NOT DETECTED NOT DETECTED   Neisseria meningitidis NOT DETECTED NOT DETECTED   Pseudomonas aeruginosa NOT DETECTED NOT DETECTED   Candida albicans NOT DETECTED NOT DETECTED   Candida glabrata NOT DETECTED NOT DETECTED   Candida krusei NOT DETECTED NOT DETECTED   Candida parapsilosis NOT DETECTED NOT DETECTED   Candida tropicalis NOT DETECTED NOT DETECTED    Erique Kaser S 06/04/2018  4:44 AM

## 2018-06-04 NOTE — Progress Notes (Signed)
[place] [facility] [place] NAME: [name]    MR#:  [id number]  DATE OF BIRTH:  [birth year]  SUBJECTIVE:  CHIEF COMPLAINT:   Chief Complaint  Patient presents with  . Respiratory Distress  on & off Bipap during day REVIEW OF SYSTEMS:  Review of Systems  Constitutional: Negative for chills, fever and weight loss.  HENT: Negative for nosebleeds and sore throat.   Eyes: Negative for blurred vision.  Respiratory: Positive for shortness of breath. Negative for cough and wheezing.   Cardiovascular: Negative for chest pain, orthopnea, leg swelling and PND.  Gastrointestinal: Negative for abdominal pain, constipation, diarrhea, heartburn, nausea and vomiting.  Genitourinary: Negative for dysuria and urgency.  Musculoskeletal: Negative for back pain.  Skin: Negative for rash.  Neurological: Negative for dizziness, speech change, focal weakness and headaches.  Endo/Heme/Allergies: Does not bruise/bleed easily.  Psychiatric/Behavioral: Negative for depression.    DRUG ALLERGIES:   Allergies  Allergen Reactions  . Iodine Anaphylaxis  . Penicillins Anaphylaxis    Tolerates ceftriaxone, cefazolin Has patient had a PCN reaction causing immediate rash, facial/tongue/throat swelling, SOB or lightheadedness with hypotension: Unknown Has patient had a PCN reaction causing severe rash involving mucus membranes or skin necrosis: Unknown Has patient had a PCN reaction that required hospitalization: Unknown Has patient had a PCN reaction occurring within the last 10 years: Unknown If all of the above answers are "NO", then may proceed with Cephalosporin u  . Shellfish Allergy Anaphylaxis  . Shellfish-Derived Products Anaphylaxis  . Sulfa Antibiotics Anaphylaxis  . Sulfacetamide Sodium Anaphylaxis  . Sulfasalazine Anaphylaxis  . Morphine And Related Other (See Comments)    Altered mental status   VITALS:  Blood pressure (!) 146/45, pulse 86,  temperature 98.7 F (37.1 C), temperature source Axillary, resp. rate 18, height 5' (1.524 m), weight 100.3 kg, SpO2 97 %. PHYSICAL EXAMINATION:  Physical Exam  Constitutional: She is oriented to person, place, and time. She appears lethargic.  HENT:  Head: Normocephalic and atraumatic.  Eyes: Pupils are equal, round, and reactive to light. Conjunctivae and EOM are normal.  Neck: Normal range of motion. Neck supple. No tracheal deviation present. No thyromegaly present.  Cardiovascular: Normal rate, regular rhythm and normal heart sounds.  Pulmonary/Chest: Effort normal and breath sounds normal. No respiratory distress. She has no wheezes. She exhibits no tenderness.  Abdominal: Soft. Bowel sounds are normal. She exhibits no distension. There is no tenderness.  Musculoskeletal: Normal range of motion.  Neurological: She is oriented to person, place, and time. She appears lethargic. No cranial nerve deficit.  Skin: Skin is warm and dry. No rash noted.   LABORATORY PANEL:  Female CBC Recent Labs  Lab 06/04/18 0116  WBC 14.2*  HGB 10.9*  HCT 37.5  PLT 151   ------------------------------------------------------------------------------------------------------------------ Chemistries  Recent Labs  Lab 06/03/18 1122 06/04/18 0116  NA 146* 151*  K 5.3* 4.3  CL 97* 100  CO2 36* 39*  GLUCOSE 340* 271*  BUN 36* 41*  CREATININE 1.08* 0.89  CALCIUM 9.5 9.7  AST 16  --   ALT 10  --   ALKPHOS 72  --   BILITOT 1.6*  --    RADIOLOGY:  Dg Chest Port 1 View  Result Date: 06/04/2018 CLINICAL DATA:  Acute respiratory failure.  Follow-up exam. EXAM: PORTABLE CHEST 1 VIEW COMPARISON:  06/03/2018 and older exams. FINDINGS: Stable enlargement of the cardiopericardial silhouette. Basilar opacity has improved compared to the previous day's study consistent  with a reduction in sizes of pleural effusions and associated atelectasis. Remainder of the lungs is clear. No pneumothorax. IMPRESSION: 1.  Improved lung aeration with decreased lung base atelectasis and smaller pleural effusions. 2. No new abnormalities.  No current evidence of pulmonary edema. Electronically Signed   By: Lajean Manes M.D.   On: 06/04/2018 06:59   ASSESSMENT AND PLAN:   *Acute on chronic respiratory failure with hypoxia and hypercapnia Secondary to healthcare associated pneumonia Sepsis and UTI Currently on BiPAP as need and weaning efforts to [place]., monitor in stepdown unit for now  *Healthcare associated pneumonia cefepime  *UTI Await c/s, continue cefepime  *Altered mental status, metabolic encephalopathy due to infections and respiratory failure CT head is negative, monitor.  *Chronic diastolic congestive heart failure Currently no exacerbation symptoms, will monitor.  *COPD Currently no active wheezing, continue with BiPAP in stepdown.  *Elevated troponin Trending down,demand ischemia  *Elevated d-dimer Likely due to sepsis.  Continue monitor.  * Hypernatremia: consider D5     All the records are reviewed and case discussed with Care Management/Social Worker. Management plans discussed with the patient, nursing and they are in agreement.  CODE STATUS: Full Code  TOTAL TIME TAKING CARE OF THIS PATIENT: 15 minutes.   More than 50% of the time was spent in counseling/coordination of care: YES  POSSIBLE D/C IN 2-3 DAYS, DEPENDING ON CLINICAL CONDITION.   Max Sane M.D on 06/04/2018 at 6:32 PM  Between 7am to 6pm - Pager - [phone]  After 6pm go to [email or web address] - Technical brewer  Hospitalists  Office  [phone]  CC: Primary care physician; [name], MD  Note: This dictation was prepared with Dragon dictation along with smaller phrase technology. Any transcriptional errors that result from this process are unintentional.

## 2018-06-04 NOTE — Care Management Note (Signed)
Case Management Note  Patient Details  Name: [name] MRN: [id number] Date of Birth: [birth year]  Subjective/Objective:    Patient admitted to the ICU with respiratory distress and altered mental status.  Patient was a planned discharged from [place] yesterday 10/30.  She was picked up by her husband and the plan was to take her to her PCP and the wound clinic and then home.  Home Health is arranged with [place].  At the Wound clinic appointment ems was called for respiratory distress and AMS.  RNCM called [facility] and spoke with Claiborne Billings [phone] to confirm planned discharged yesterday and services set up in the home.  Claiborne Billings states the discharge was planned, the patient was on oxygen at [facility] and discharged with oxygen.  Per the patient's husband there has not been oxygen set up in the home.  Claiborne Billings with [facility] will verify information with discharge planner of [place] and return my call.  Claiborne Billings was able to tell me that home health services have been set up with [facility], and equipment ordered was a hoyer lift.  The family reports that the patient should have a CPAP for home because of apnea but the patient has never had a sleep study and a CPAP has not been ordered.  RNCM will continue to gather information and follow up.                     Action/Plan:   Expected Discharge Date:                  Expected Discharge Plan:  [place]  In-House Referral:     Discharge planning Services  CM Consult  Post Acute Care Choice:    Choice offered to:     DME Arranged:    DME Agency:     HH Arranged:    [place] Agency:     Status of Service:  In process, will continue to follow  If discussed at Long Length of Stay Meetings, dates discussed:    Additional Comments:  Shelbie Hutching, RN 06/04/2018, 11:08 AM

## 2018-06-04 NOTE — Progress Notes (Signed)
O2 to .30  RR to 16

## 2018-06-04 NOTE — Progress Notes (Signed)
Pharmacy Antibiotic Note  [name] is a 77 y.o. female admitted on 06/03/2018 with sepsis secondary to pneumonia and UTI. Of note, patient was hospitalized last month for UTI (9/6 UCx grew E. Coli, pansensitive; treated with CTX x7 days) and was sent to [place] since then. Pharmacy has been consulted for cefepime and vancomycin dosing.  Plan: Continue Cefepime 2 gm IV Q12H.  Vancomycin was discontinued this morning due to the negative MRSA PCR.   Height: 5' (152.4 cm) Weight: 221 lb 1.9 oz (100.3 kg) IBW/kg (Calculated) : 45.5  Temp (24hrs), Avg:98.8 F (37.1 C), Min:97.7 F (36.5 C), Max:100 F (37.8 C)  Recent Labs  Lab 06/03/18 1122 06/03/18 1651 06/04/18 0116  WBC 16.8*  --  14.2*  CREATININE 1.08*  --  0.89  LATICACIDVEN 1.9 2.0*  --     Estimated Creatinine Clearance: 58.1 mL/min (by C-G formula based on SCr of 0.89 mg/dL).    Allergies  Allergen Reactions  . Iodine Anaphylaxis  . Penicillins Anaphylaxis    Tolerates ceftriaxone, cefazolin Has patient had a PCN reaction causing immediate rash, facial/tongue/throat swelling, SOB or lightheadedness with hypotension: Unknown Has patient had a PCN reaction causing severe rash involving mucus membranes or skin necrosis: Unknown Has patient had a PCN reaction that required hospitalization: Unknown Has patient had a PCN reaction occurring within the last 10 years: Unknown If all of the above answers are "NO", then may proceed with Cephalosporin u  . Shellfish Allergy Anaphylaxis  . Shellfish-Derived Products Anaphylaxis  . Sulfa Antibiotics Anaphylaxis  . Sulfacetamide Sodium Anaphylaxis  . Sulfasalazine Anaphylaxis  . Morphine And Related Other (See Comments)    Altered mental status    Antimicrobials this admission: Levofloxacin 750 mg IV x 1 in ED Vancomycin 1000 mg x 1, 1250 mg x 1   Cefepime 10/30 >>   Dose adjustments this admission:   Microbiology results: 10/30 BCx: no growth x 24 hours             BCx (anerobic bottle only): GPR, pending   10/30 UCx: >100,000 GNRs   10/30 MRSA PCR: negative   Thank you for allowing pharmacy to be a part of this patient's care.  [place], [place].D. Candidate  06/04/2018 11:16 AM

## 2018-06-04 NOTE — Progress Notes (Signed)
Name: [name] MRN: [id number] DOB: [birth year]     CONSULTATION DATE: 06/03/2018  Subjective & objective: Improved mental and respiratory status.  PAST MEDICAL HISTORY :   has a past medical history of Acute heart failure ([place]), Aortic stenosis, CHF (congestive heart failure) ([place]), Complication of anesthesia, Diabetes mellitus without complication ([place]), Diabetic neuropathy ([place]), Edema of both legs, GERD (gastroesophageal reflux disease), High cholesterol, HTN (hypertension), Hypertension, PAD (peripheral artery disease) ([place]), Shortness of breath dyspnea, Spasm of back muscles, Stroke ([place]), and Wound, open.  has a past surgical history that includes Intramedullary (im) nail intertrochanteric (Left, 02/04/2014); ORIF toe fracture (Right, 02/08/2014); Cardiac catheterization (N/A, 12/28/2015); Cyst excision; Eye surgery (Bilateral); Bypass graft popliteal to tibial (Right, 02/28/2016); Vein harvest (Right, 02/28/2016); Radiology with anesthesia (N/A, 03/01/2016); ir generic historical (03/01/2016); ir generic historical (03/01/2016); ir generic historical (03/01/2016); and ir generic historical (06/03/2016). Prior to Admission medications   Medication Sig Start Date End Date Taking? Authorizing Provider  acetaminophen (TYLENOL) 500 MG tablet Take 1,000 mg by mouth every 6 (six) hours as needed for mild pain.   Yes [provider]  albuterol (PROVENTIL HFA;VENTOLIN HFA) 108 (90 Base) MCG/ACT inhaler Inhale 2 puffs into the lungs every 6 (six) hours as needed for wheezing or shortness of breath.    Yes [provider]  aspirin 325 MG tablet Take 1 tablet (325 mg total) by mouth daily with breakfast. Patient taking differently: Take 325 mg by mouth daily.  07/10/16  Yes Holley Raring, MD  atorvastatin (LIPITOR) 20 MG tablet Take 1 tablet (20 mg total) by mouth daily. 03/13/16  Yes Virgina Jock A, PA-C  carvedilol (COREG) 6.25 MG tablet Take 6.25 mg by mouth 2 (two) times daily.     Yes [provider]  cetirizine (ZYRTEC) 10 MG tablet Take 10 mg by mouth daily.    Yes [provider]  cholecalciferol (VITAMIN D) 1000 units tablet Take 2,000 Units by mouth daily.    Yes [provider]  clopidogrel (PLAVIX) 75 MG tablet Take 1 tablet (75 mg total) by mouth daily with breakfast. 03/04/17  Yes Conrad [place], MD  docusate sodium (COLACE) 100 MG capsule Take 100 mg by mouth 2 (two) times daily.   Yes [provider]  fluticasone (FLONASE) 50 MCG/ACT nasal spray Place 1 spray into both nostrils every evening.    Yes [provider]  insulin aspart (NOVOLOG) 100 UNIT/ML injection Inject 0-15 Units into the skin 3 (three) times daily with meals. Before each meal 3 times a day, 140-199 - 3 units, 200-250 - 5 units, 251-299 - 8 units,  300-349 - 12 units,  350 or above 15 units. Patient taking differently: Inject 14 Units into the skin 3 (three) times daily with meals.  02/09/14  Yes Thurnell Lose, MD  insulin glargine (LANTUS) 100 UNIT/ML injection Inject 0.27 mLs (27 Units total) into the skin at bedtime. Patient taking differently: Inject 40 Units into the skin at bedtime.  09/16/16  Yes Reyne Dumas, MD  isosorbide dinitrate (ISORDIL) 30 MG tablet Take 30 mg by mouth daily.  04/16/17  Yes [provider]  nitroGLYCERIN (NITROSTAT) 0.4 MG SL tablet Place 1 tablet (0.4 mg total) under the tongue every 5 (five) minutes as needed for chest pain. 07/09/16  Yes Holley Raring, MD  nystatin ointment (MYCOSTATIN) Apply 1 application topically 3 (three) times daily. Apply to area below breasts   Yes [provider]  omeprazole (PRILOSEC) 20 MG capsule  Take 1 capsule (20 mg total) by mouth daily. 04/17/18  Yes Sainani, Belia Heman, MD  polyethylene glycol (MIRALAX / GLYCOLAX) packet Take 17 g by mouth 2 (two) times daily. 02/09/14  Yes Thurnell Lose, MD  potassium chloride SA (K-DUR,KLOR-CON) 20 MEQ tablet Take 20 mEq by mouth daily.    Yes [provider]  pregabalin (LYRICA) 50 MG capsule Take 50 mg by mouth 2 (two) times daily.    Yes [provider]  torsemide (DEMADEX) 20 MG tablet Take 2 tablets (40 mg total) by mouth 2 (two) times daily. 04/17/18  Yes Sainani, Belia Heman, MD  VITAMIN E PO Take 1 capsule by mouth daily.   Yes [provider]   Allergies  Allergen Reactions  . Iodine Anaphylaxis  . Penicillins Anaphylaxis    Tolerates ceftriaxone, cefazolin Has patient had a PCN reaction causing immediate rash, facial/tongue/throat swelling, SOB or lightheadedness with hypotension: Unknown Has patient had a PCN reaction causing severe rash involving mucus membranes or skin necrosis: Unknown Has patient had a PCN reaction that required hospitalization: Unknown Has patient had a PCN reaction occurring within the last 10 years: Unknown If all of the above answers are "NO", then may proceed with Cephalosporin u  . Shellfish Allergy Anaphylaxis  . Shellfish-Derived Products Anaphylaxis  . Sulfa Antibiotics Anaphylaxis  . Sulfacetamide Sodium Anaphylaxis  . Sulfasalazine Anaphylaxis  . Morphine And Related Other (See Comments)    Altered mental status    FAMILY HISTORY:  family history includes CVA in her father; Diabetes in her father and other; Hypertension in her father; Liver disease in her mother. SOCIAL HISTORY:  reports that she has never smoked. She has never used smokeless tobacco. She reports that she does not drink alcohol or use drugs.  REVIEW OF SYSTEMS:   Unable to obtain due to critical illness   VITAL SIGNS: Temp:  [97.7 F (36.5 C)-99.1 F (37.3 C)] 98.9 F (37.2 C) (10/31 1200) Pulse Rate:  [67-98] 90 (10/31 1400) Resp:  [14-30] 25 (10/31 1400) BP: (103-222)/(26-188) 111/26 (10/31 1400) SpO2:  [94 %-100 %] 94 % (10/31 1400) FiO2 (%):  [30 %-35 %] 30 % (10/31 0815) Weight:  [100.3 kg] 100.3 kg (10/30 1622)  Physical Examination:  Lethargic, respond to verbal  stimulation no focal motor deficits.  Spontaneously moving all extremities Tolerating BiPAP, no distress, bilateral equal air entry and no adventitious sounds S1 & S2 are audible with no murmur Benign abdominal exam with normal peristalsis Wasted extremities no edema was paresthesia of both lower extremities   A  & P  Acute on chronic respiratory failure was baseline obstructive sleep apnea.  Tolerating BiPAP -Try off BiPAP on the nasal cannula and optimize O2 therapy to keep O2 sat 88 to 90%  Altered mental status (improved) with toxic metabolic encephalopathy and hypercarbic respiratory failure.  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 -Monitor neuro status  AKI, mild hyperkalemia and hyponatremia with intravascular volume depletion. -Optimize volume, avoid nephrotoxins, monitor renal panel and urine output.  Elevated troponin more likely due to demand versus supply mismatch, no chest pain no acute specific ST segment changes on EKG. echocardiogram 04/2018 LVEF 55 to 60% no wall motion abnormalities -Monitor cardiac enzymes  UTI.  E. coli -Continue cefepime and follow final culture results  Atelectasis and pneumonia.  Bibasilar airspace disease -Continue with cefepime, DC vancomycin with MRSA PCR negative -Monitor CXR + CBC +  FiO2  Right upper lobe lung nodule on the CT chest.  Possible infective/inflammatory/neoplastic etiology -Pulmonary consult after resolution of acute phase of illness and follow-up with CT scan in 6 months  Sepsis -Empiric antibiotic, monitor pro count and cultures  Anemia -Keep hemoglobin more than 7 g/dL  Diabetes mellitus -Glycemic control  Hypertension -Optimize antihypertensives and monitor hemodynamics  Full code  DVT & GI prophylaxis.  Continue with supportive care  Family was updated at the bedside and they agreed to the plan of  care Critical care time 45 minutes

## 2018-06-04 NOTE — Progress Notes (Signed)
FIO2 TO .30  RR to 16

## 2018-06-04 NOTE — Care Management Note (Signed)
Case Management Note  Patient Details  Name: [name] MRN: [id number] Date of Birth: Jun 30, 1941  Subjective/Objective:       RNCM spoke with Claiborne Billings from [facility] again and [place] verified with her discharge planner that the patient was not on oxygen at the facility and was not discharged with oxygen.  Husband told RNCM that the patient was discharged with oxygen.  No oxygen was ordered or supposed to be set up in the home.  Patient was also not on a CPAP at [facility].   RNCM will cont to follow for discharge planning- patient is not ready for discharge at this time.                 Action/Plan:   Expected Discharge Date:                  Expected Discharge Plan:  [place]  In-House Referral:     Discharge planning Services  CM Consult  Post Acute Care Choice:    Choice offered to:     DME Arranged:    DME Agency:     HH Arranged:    [place] Agency:     Status of Service:  In process, will continue to follow  If discussed at Long Length of Stay Meetings, dates discussed:    Additional Comments:  Shelbie Hutching, RN 06/04/2018, 11:37 AM

## 2018-06-04 NOTE — Progress Notes (Signed)
RR to 16 & FIO2 to .30

## 2018-06-04 NOTE — Progress Notes (Signed)
Inpatient Diabetes Program Recommendations  AACE/ADA: New Consensus Statement on Inpatient Glycemic Control (2015)  Target Ranges:  Prepandial:   less than 140 mg/dL      Peak postprandial:   less than 180 mg/dL (1-2 hours)      Critically ill patients:  140 - 180 mg/dL   Results for [name], [name] (MRN [id number]) as of 06/04/2018 09:23  Ref. Range 06/03/2018 10:26 06/03/2018 16:22 06/03/2018 19:31  Glucose-Capillary Latest Ref Range: 70 - 99 mg/dL 335 (H) 284 (H)  8 units NOVOLOG  309 (H)  11 units NOVOLOG    Results for [name], [name] (MRN [id number]) as of 06/04/2018 09:23  Ref. Range 06/03/2018 23:24 06/04/2018 03:32 06/04/2018 08:26  Glucose-Capillary Latest Ref Range: 70 - 99 mg/dL 229 (H)  5 units NOVOLOG  255 (H)  8 units NOVOLOG  284 (H)  8 units NOVOLOG     Admit CHF/ COPD/ PNA/ UTI/ Sepsis  History: DM  Home DM Meds: Lantus 40 units QHS        Novolog 14 units TID  Current Orders: Novolog Moderate Correction Scale/ SSI (0-15 units) Q4 hours     MD- Please consider starting Lantus 30 units QHS (75% total home dose)    --Will follow patient during hospitalization--  Wyn Quaker RN, MSN, CDE Diabetes Coordinator Inpatient Glycemic Control Team Team Pager: [phone] (8a-5p)

## 2018-06-05 ENCOUNTER — Inpatient Hospital Stay: Payer: Medicare HMO

## 2018-06-05 DIAGNOSIS — J9621 Acute and chronic respiratory failure with hypoxia: Secondary | ICD-10-CM

## 2018-06-05 LAB — CBC WITH DIFFERENTIAL/PLATELET
ABS IMMATURE GRANULOCYTES: 0.02 10*3/uL (ref 0.00–0.07)
Basophils Absolute: 0 10*3/uL (ref 0.0–0.1)
Basophils Relative: 0 %
Eosinophils Absolute: 0.2 10*3/uL (ref 0.0–0.5)
Eosinophils Relative: 2 %
HCT: 38.9 % (ref 36.0–46.0)
HEMOGLOBIN: 11.5 g/dL — AB (ref 12.0–15.0)
Immature Granulocytes: 0 %
LYMPHS PCT: 5 %
Lymphs Abs: 0.5 10*3/uL — ABNORMAL LOW (ref 0.7–4.0)
MCH: 26.5 pg (ref 26.0–34.0)
MCHC: 29.6 g/dL — ABNORMAL LOW (ref 30.0–36.0)
MCV: 89.6 fL (ref 80.0–100.0)
MONO ABS: 0.9 10*3/uL (ref 0.1–1.0)
MONOS PCT: 9 %
NEUTROS ABS: 7.9 10*3/uL — AB (ref 1.7–7.7)
Neutrophils Relative %: 84 %
PLATELETS: 133 10*3/uL — AB (ref 150–400)
RBC: 4.34 MIL/uL (ref 3.87–5.11)
RDW: 15.6 % — ABNORMAL HIGH (ref 11.5–15.5)
WBC: 9.5 10*3/uL (ref 4.0–10.5)
nRBC: 0 % (ref 0.0–0.2)

## 2018-06-05 LAB — URINE CULTURE

## 2018-06-05 LAB — BASIC METABOLIC PANEL
Anion gap: 10 (ref 5–15)
BUN: 41 mg/dL — AB (ref 8–23)
CO2: 39 mmol/L — AB (ref 22–32)
Calcium: 9.6 mg/dL (ref 8.9–10.3)
Chloride: 102 mmol/L (ref 98–111)
Creatinine, Ser: 0.87 mg/dL (ref 0.44–1.00)
GFR calc Af Amer: >60 mL/min (ref >60)
GLUCOSE: 210 mg/dL — AB (ref 70–99)
Potassium: 4.2 mmol/L (ref 3.5–5.1)
Sodium: 151 mmol/L — ABNORMAL HIGH (ref 135–145)

## 2018-06-05 LAB — PROCALCITONIN: Procalcitonin: 1.68 ng/mL

## 2018-06-05 LAB — GLUCOSE, CAPILLARY
GLUCOSE-CAPILLARY: 205 mg/dL — AB (ref 70–99)
GLUCOSE-CAPILLARY: 170 mg/dL — AB (ref 70–99)
GLUCOSE-CAPILLARY: 116 mg/dL — AB (ref 70–99)
GLUCOSE-CAPILLARY: 334 mg/dL — AB (ref 70–99)
Glucose-Capillary: 266 mg/dL — ABNORMAL HIGH (ref 70–99)
Glucose-Capillary: 331 mg/dL — ABNORMAL HIGH (ref 70–99)

## 2018-06-05 MED ORDER — SODIUM CHLORIDE 0.9 % IV SOLN
1.0000 g | Freq: Three times a day (TID) | INTRAVENOUS | Status: DC
  Administered 2018-06-05 – 2018-06-10 (×14): 1 g via INTRAVENOUS
  Filled 2018-06-05 (×20): qty 1

## 2018-06-05 NOTE — Progress Notes (Signed)
[name] ([id number]) Visit Report for 06/03/2018 HPI Details Patient Name: [name], [name] Date of Service: 06/03/2018 9:30 AM Medical Record Number: [id number] Patient Account Number: [id number] Date of Birth/Sex: 1940-08-18 (77 y.o. F) Treating RN: Cornell Barman Primary Care Provider: Beverlyn Roux Other Clinician: Referring Provider: Beverlyn Roux Treating Provider/Extender: Tito Dine in Treatment: 31 History of Present Illness HPI Description: 08/13/16: This is a 77 year old woman who came predominantly for review of 3 cm in diameter circular wound to the left anterior lateral leg. She was in the ER on 08/01/16 I reviewed their notes. There was apparently pus coming out of the wound at that time and the patient arrived requesting debridement which they don't do in the emergency room. Nevertheless I can't see that they did any x-rays. There were no cultures done. She is a type II diabetic and I a note after the patient was in the clinic that she had a bypass graft from the popliteal to the tibial on the right on 02/28/16. She also had a right greater saphenous vein harvest on the same date for arterial bypass. She is going to have vascular studies including ABIs T ABIs on the right on 08/28/16. The patient's surgery was on 02/28/16 by Dr. Vallarie Mare she had a right below the knee popliteal artery to peroneal artery bypass with reverse greater saphenous vein and an endarterectomy of the mid segment peroneal artery. Postoperatively she had a strong mild monophasic peroneal signal with a pink foot. It would appear that the patient is had some nonhealing in the surgical saphenous vein harvest site on the left leg. Surprisingly looking through [facility] I cannot see much information about this at all. Dr. Lucious Groves notes from 05/29/16 show that the patient's wounds "are not healed" the right first metatarsal wound healed but then opened back up. 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 At that point the patient had a wound VAC to her right calf with regards to the wounds on her dorsal right toe would appear that these are felt to be arterial wounds. She has had surgery on the metatarsal phalangeal in 2015 I Dr. Doran Durand secondary to a right metatarsal phalangeal joint fracture. She is apparently had discoloration around this area since then. 08/28/16; patient arrives with her wounds in much the same condition. The linear vein harvest site and the circular wound below it which I think was a blister. She also has to probing holes in her right great toe and a necrotic eschar on the right second toe. Because of these being arterial wounds I reduced her compression from 3-2 layers this seems to of done satisfactorily she has not had any problems. I cannot see that she is actually had an x-ray ====== 11/06/16 the patient comes in for evaluation of her right lower extremity ulcers. She was here in January 2018 for 2 visits subsequently ended up in the hospital with pneumonia and then to rehabilitation. She has now been discharged from rehabilitation and is home. She has multiple ulcerations to her right lower extremity including the foot and toes. She does have home health in place and they have been placing alginate to the ulcers. She is followed by Dr. Bridgett Larsson of vascular medicine. 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 She recently saw him on 3/23. In office ABIs were:  Right 0.48 with monophasic flow to the DP, PT, peroneal Left 0.63 with monophasic flow to the DP and PT Her arterial studies indicated a patent right below knee popliteal to peroneal bypass She had an MRI in February 2008 that was negative frosty myelitis but this showed general soft tissue edema in the right foot and lower extremity concerning  for cellulitis She is a diabetic, managed with insulin. Her hemoglobin A1c in December 2017 was 8.4 which is a trend up from previous levels. She had blood work in February 2018 which revealed an albumin of 2.6 this appears to be relatively acute as an albumin in November 2017 was 3.7 11/13/16; this is a patient I have not seen since February who is readmitted to our clinic last week. 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 Bridgett Larsson. I have reviewed Dr. Lianne Moris notes from March/23/18. Doppler ABI on that date showed an ABI on the right of 0.48 and on the left of 0.63. Dorsalis pedis waveforms [name], [name] ([id number]) were monophasic bilaterally. There was no waveforms detected at the posterior tibial on the right, monophasic on the left. Dr. Lianne Moris comments were that this patient would have follow-up vascular studies in 3 months including ABIs and right lower extremity arterial duplex. She had an MRI in February that was negative for osteomyelitis but showed generalized edema in the foot. Last albumin I see was in January at 3.4 we have been using Santyl to the 4 wounds on the right leg the patient is noted today to have widespread edema well up towards her groin this is pitting 2-3+. 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 Normal ejection fraction. 11/20/16; patient has a follow-up appointment with Dr. Bridgett Larsson on April 23. She is still complaining of a lot of pain in the right foot and right leg. It is not clear to me that this is at all positional however I think it is clear claudication with minimal activity perhaps at rest. At our suggestion she is return to her primary physician's office tomorrow with regards to her pitting lower extremity bilateral edema that I reviewed in detail last week 11/27/16; the follow-up with Dr. Bridgett Larsson was actually on May 23 on April 23 as I  stated in my note last week. N/A case all of her wounds seems somewhat smaller. The 2 on the right leg are definitely smaller. The areas on the dorsal right first toe, right third toe and the lateral part of the right fifth metatarsal head all looks smaller but have tightly adherent surfaces. We have been using Santyl 12/04/16; follow-up with Dr. Bridgett Larsson on May 23. 2 small open wounds on the right leg continue to get smaller. The area on the right third total lateral aspect of the right fifth toe also look better. The remaining area on the dorsal first toe still has some depth to it. We have been using Santyl to the toes and collagen on the right 12/11/16; according to patient's husband the follow-up with Dr. Bridgett Larsson is not until July. All of the wounds on the right leg are measuring smaller. We have been using some combination of Prisma and Santyl although I think we can go to straight Prisma today. There may have been some confusion with home health about the primary dressing orders here. 12/25/16; the patient has had some healing this week. The area on her right  lateral fifth metatarsal head, right third toe are both healed lower right leg is healed. In the vein harvest site superiorly she has one superficial open area. On the dorsal aspect of her right great toe/MTP joint the wound is now divided into 2 however the proximal area is deep and there is palpable bone 01/01/17; still an open area in the middle of her original right surgical scar. The area on the right third toe and right lateral fifth metatarsal head remained closed. Problematic area on the dorsal aspect of the toe. Previous surgery in this area line 01/08/17 small open area on the original scar on her upper anterior leg although this is closing. X-ray I did of the right first toe did not show underlying bony abnormality. Still this area on the dorsal first toe probes to bone. We have been using Prisma 01/15/17; small open area in the original scar  in the upper anterior leg is almost fully closed. She has 2 open areas over the dorsal aspect of the right first toe that probes to bone. Used and a form starting last week. Vigorous bone scraping that I did last week showed few methicillin sensitive staph aureus. She is allergic to penicillin and sulfa drugs. I'm going to give her 2 weeks of doxycycline. She will need an MRI with contrast. 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 She continues on doxycycline for a bone scraping that showed methicillin sensitive staph aureus [allergic to penicillin and sulfa]. We have been using Endo form to the wound. 01/29/17; surprisingly her MRI did not show osteomyelitis. She continues on doxycycline for a bone scraping that showed methicillin sensitive staph aureus with allergies to penicillin and sulfa. We have been using Endo form to the wound. Unfortunately I cannot get a surface on this visit looks like it is able to support a healing state. Proximally there is still exposed bone. There is no overt soft tissue tenderness. Her MRI did show a previous fracture was surgery to this area but no hardware 02/12/17 on evaluation today patient appears to be doing well in regard to her lower extremity wound. She does have some mild discomfort but this is minimal. There's no evidence of infection. Her left great toe nail has somewhat been lifted up and there is a little bit of slight bleeding underneath but this is still firmly attached. 02/19/17; she ran out of doxycycline 2 days ago. Nevertheless I would like to continue this for 2 weeks to make a full 6 weeks of therapy as the bone scraping that I did from the open area on the dorsal right first toe showed MRSA. This should complete antibiotic therapy. 02/26/17; the patient is completing her 6 weeks of oral doxycycline. Deep wound on the dorsal right toe. This still has some exposed bone  proximally. She does have a reasonably granulated surface albeit thin over bone. I've been using Endo form have made application for an amniotic skin sub 03/05/17; the patient has completed 6 weeks of doxycycline. This is a deep wound on the dorsal right toe which has exposed bone proximally. She has granulation over most of this wound albeit a thin layer. I've been using Endo form. Still do not have approval for Affinity 03/13/17 on evaluation today patient's right foot wound appears to be doing better measurement wise compared to her last evaluation. She has been tolerating the dressing change without complication and  does have a appointment with the dietary that has been made as far as referral is concerned there just waiting for her and transportation to contact them back for an actual date. Nonetheless patient has been having less pain at this point. No fevers, chills, nausea, or vomiting noted at this time. 03/26/17; linear wound over the dorsal aspect of her right great toe. At one point this had exposed bone on the most superior aspect however I am pleased to see today that this appears to have a surface of granulation. Apparently product was applied [place], Saidee V. ([id number]) for but denied by [facility]. We'll need to see what that was. The patient has known PAD status post revascularization. MRI did not show osteomyelitis which was done in June 04/02/17; continued improvement using Endoform. She was approved for [employer] but hasn't over $371 co-pay per application, this is beyond her means 04/09/17; continues on Endoform. 04/16/17; appears to be doing nicely continuing on Endoform 04/22/17; patient did not have her dressing changed all week because of the weather. Using Endoform. Base of the wound looks healthy elbow there appears to events surrounding maceration perhaps because of the drainage was not changing the wound dressing 04/30/17 wound today continues to close and except for a small divot on  the superior part of the wound. This area did not probe the bone but I found this a little concerning as this was the area with exposed bone before we be able to get this to granulate forward. As I remember things she is not a candidate for skin substitutes secondary to an outlandish co-pay. I asked her husband to look into the out of pocket max for medications if he has 1 [Regranex] or totally for skin substitutes 05/07/17; the patient arrives today with a wound roughly the same size although under careful inspection under the light there appears to be some epithelialization. Her intake nurse noted that the Endoform seemed to be placed over the wound rather than in the deeper divots they could really benefit from the Endoform. At this point I have no plans to change the Endoform as at one point this was at least 33% exposed bone and the rest of the wound very close to the underlying phalanx 05/14/17; no major change in the size or appearance of the wound. We have been using Endoform for a prolonged period of time with reasonably good improvement in the epithelialization i.e. no exposed bone especially proximally. Unless we've not made a lot of changes in the last several weeks. She does not complain of pain. She was revascularized early this year or perhaps late last year by Dr. Bridgett Larsson and I wonder if he will need to have a look at her, I will need to review the actual vascular procedure which I think was a distal popliteal bypass 05/21/17; switched to either for a blue last week. Patient arrived in clinic with the wound not measuring any better but the surface looking some better. Unfortunately she had some surface slough and when I went to remove this superiorly she had exposed bone. Previously she had had exposed bone in the inferior part of the wound however this is granulated over some weeks ago. Clearly this is a major step back for her. With some difficulty I was able to obtain a piece of the  bone probing through the superior part of the wound for culture. We did not send pathology. It is likely that she is going to need imaging of this site but  I did not order this today 05/28/17; bone culture I took last week showed MSSA. She still has exposed bone however I could not get another piece for pathology. She had an MRI 4 months ago that did not show osteoarthritic at time. Wound rapidly deteriorated superiorly and I suspect this is underlying osteomyelitis. We have been using Hydrofera Blue 06/04/17 on evaluation today patient's wound appears to actually be doing a little bit better visually compared to last week's evaluation which is good news. Nonetheless she does have osteomyelitis of the toe which does not appear to be MRSA. No fevers, chills, nausea, or vomiting noted at this time. She is having no discomfort. 06/11/17; patient's wound looks a little healthier than I remember seeing it 2 weeks ago. There is no exposed bone and the surface of the wound appears well granulated. We've been using hydrofera blue. I note that she was started on doxycycline which was MSSA. Her appointment with Dr. Ola Spurr is on November 12 I believe. 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 Vallarie Mare of vascular surgery on 11/9. He offered right leg angiography possible intervention however the patient refused. I've discussed this with the patient's husband today she did not want any further procedures. Also noteworthy that Dr. Vallarie Mare stated that this wound had not healed in 3 years, I was not aware of this degree of chronicity in this area. She has underlying osteomyelitis by bone culture. Culture of this grew MSSA, she is allergic to penicillin and therefore not a candidate for beta lactams. She saw Dr. Ola Spurr of infectious disease this morning, he continued her on doxycycline for another month and apparently sent in a prescription. We have been using Hydrofera Blue. ABI's  update by Dr. Bridgett Larsson right 0.62, left 0.89. Monophasic wave forms 06/25/17 on evaluation today patient appears to be doing well in regard to her right great toe wound. She is still taking the doxycycline as prescribed by Dr. Ola Spurr. The Hydrofera Blue Dressing's to be doing as well as anything has for her up to this point and we are still waiting on an insurance authorization for the [place]. She is having no pain which is good news. No fevers, chills, nausea, or vomiting noted at this time. 07/02/17; still taking doxycycline as prescribed by Dr. Ola Spurr. Hydrofera Blue continues. We have no palpable bone. Still have not had had approval of Apligraf 08/06/16; the patient arrives today with [facility] even though we apparently had changed to Sorbact last time. Her co-pay for Regranex is $389 in discussion with the patient and her husband this was excessive. We'll continue with the Sorbact and standard dressings for now 08/20/17; we have been using sorbact. The wound bed still requires debridement. Wound measurements are slightly better. 09/03/17;using Sorbact.no debridement today. Wound measurements slightly better. There is some discoloration of the tip of her toelooks like bruising [name], [name] ([id number]) 09/17/17; using Sorbact. No debridement today. Wound dimensions are about the same. Still some discoloration at the tip of her great toe. This does not look any worse than last time 10/01/17; using sorbact right great toe I thought she might have surface epithelialization last time although it is certainly not look like that today nevertheless her dimensions are better. Continued surface eschar on the tip of her toe but this is not progressing. She is actually complaining of the left great toe 10/15/17; this is a very difficult area on the dorsal aspect of her proximal right great toe. At one point this  wound had exposed bone however we have managed to get some degree of  epithelialization. She was revascularized by Dr. Vallarie Mare in [place]. She saw Dr. Ola Spurr for underlying osteomyelitis. She is not a candidate for advanced treatment options [insurance issues]. She comes in today with a new area on the tip of her right great toe. The exact history here is unclear. We have been using sorbact 10/29/17; patient arrives today with very significant bilateral increase edema which appears to extend into the thighs. This is tight and not particularly pitting however it looks a lot more impressive than I remember. Not surprisingly the wounds on her right great toe have increased in size with weeping edema fluid as the edema extends into the dorsal foot itself. She also has a wound on the tip of her right great toe which is a new wound from 2 weeks ago. We have been using sorbact. 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 He is on Demadex presumably twice a day at 20 mg. I have asked her family to make her an appointment with her primary physicians at the [place] [facility] to go over this degree of edema. This is causing I think deterioration in the right great toe wound. She also has significant PAD and is status post right leg bypass graft. I think this was done by [facility]. I believe they also said that there was nothing further that could be done with regards to her vascular status. 11/12/17; patient is going to see her primary doctor this afternoon with regards to lower extremity edema. She has 2 open wounds on the right great toe the original wound on the dorsal proximal part and then a more necrotic looking area on the tip of her right great toe. We took some time today to review her vascular status. The patient had a relate below knee popliteal to peroneal artery bypass with reversed greater saphenous vein on 06/13/17; she also had endarterectomy of the midsegment of the  peroneal artery. 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 I think at some point in time after that she was offered an arteriogram on the right but she declined. I've used Silver collagen to her wounds recently. 11/26/17; patient has 2 open wounds on the right great toe tip as well as the dorsal aspect of her right great toe. She has known PAD. I've been trying to get her back to see vein and vascular in [facility] to see if there are options for endovascular surgery to help with perfusion although the patient and her husband may not want any more surgery. We've been using silver alginate because of maceration. She does not have an option for advanced treatment/skin substitutes 12/10/17; continued open wounds much the same as 2 weeks ago on the dorsal aspect of the right great toe on the right great toe tip. I've encouraged to follow-up with Dr. Bridgett Larsson of vein and vascular to explore the possibility that there may be correctable ischemia involved in nonhealing these areas. We made considerable progress on the dorsal right great toe however it is stalled recently. We do not have an advanced treatment option. Originally this had exposed bone however there is a granulated base. 12/24/17; patient went back to see vein and vascular in [place]. She was seen by Manus Gunning NP. ABIs  on the right were noted to be 0.84 and TBI of the right and 0.62. On the left her ABI was 0.82 and her TBI of 0.75. Waveforms were monophasic at the posterior tibial and dorsalis pedis bilaterally. 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 Her husband reiterates today that she does not want an arteriogram therefore I'm  not going to press the issue. We've been using silver collagen to the wounds on the tip of the right great toe and dorsal surface of the right great toe. 01/07/18; patient arrives today with the tip of her toe healed. An cerebral amount of slough over the wound on the dorsal toe. As noted on 12/24/17 no options currently for revascularization the patient will not allow an arteriogram 01/21/18; typical of her right great toe remains healed. Still large amount of slough over the wound on the dorsal toe. No options for revascularization. We've been using Medihoney 02/04/18; no major change. Still a punched out area on the right great toe dorsal wound. Covered in a difficult necrotic debris. 02/18/18; no major change. Still a punched out area over the right great toe. They're using [place] 03/04/18; no major change. The wound is stalled. Still a punched out area over the right dorsal great toe. We've been using medihone for about a month not much change in the wound dimensions or the characteristics of the wound surface. We've previously used Iodoflex, Hydrofera Blue, sorbact, and I think at one point [place]. She has PAD and does not have any revascularization options although a wound distally on the tip of the great toe did close over 03/25/18; absolutely no change. Still adherent debris over the wound surface there is no palpable bone but the wound still has Malizia, Mikita V. ([id number]) some depth. This is predominantly a arterial wound and she basically has refused further attempts at revascularization. I would consider this a totally palliative situation except for she managed to heal the wound at the tip of the same toe some months ago. I changed her back to Iodoflex The other issue is that [facility] called and wants to pare back on the visits which have been 3 times a week. They will drop to 2 times a week next week 04/29/18; since the patient was last here she was admitted to hospital  from 9/6 through 9/13. She was noted to have altered LOC, declining ability to walk. 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 She was discharged to [place]. She is on oxygen chronically at 3 L. I don't think they've been doing anything specific to the wound. Her husband laments that she is no longer able to stand and walk or transfer. Yet he seems determined to take her home 05/13/18; 2 week follow-up. The patient is still at [place]. She arrives lethargic in clinic. She was also confused during her index hospitalization. She was felt to have metabolic encephalopathy secondary to UTI. Although she also had hypercapnic respiratory failure secondary to COPD and sleep apnea. She is on chronic oxygen. Paradoxically her wound actually looks a lot better and this is really filled in nicely. We're using silver collagen 06/03/18 3 week follow-up. The [facility] but was supposed to be discharged home today. She arrives in clinic virtually unresponsive. Her wound is actually healed Electronic Signature(s) Signed: 06/03/2018 5:32:05 PM By: Linton Ham MD Entered By: Linton Ham on 06/03/2018 12:16:48 Sundell, Ebbie [name] ([id number]) -------------------------------------------------------------------------------- Physical Exam Details Patient Name: [name], [name] Date of Service: 06/03/2018 9:30 AM Medical Record Number: [id number] Patient Account Number: [id number] Date of Birth/Sex: February 05, 1941 (77 y.o. F) Treating RN: Cornell Barman Primary Care Provider: Beverlyn Roux Other Clinician: Referring Provider: Beverlyn Roux Treating Provider/Extender: Tito Dine in Treatment: 3 Constitutional Sitting or standing Blood Pressure is within target range for patient.. Pulse regular and within target range for  patient.[name] Respirations regular, non-labored and within target range.. Temperature is normal and within the target range for the patient.. Patient arrives in clinic minimally responsive. Eyes Eyes were closed. Respiratory Above normal respiratory effort noted. Respiratory rate elveaated. Bibasilar crackles. Cardiovascular Heart sounds are distant. Gastrointestinal (GI) Abdomen is soft perhaps some left CVA tenderness it was not reproducible. Genitourinary (GU) No clear suprapubic tenderness. Integumentary (Hair, Skin) No rash was seen. Neurological Patient arrives in clinic minimally responsive. She seemed to have a head bobbing tremor and also an episode tremor of her right hand. Nothing overtly lateralizing. Psychiatric Patient is lethargic and doesn't even respond to the sternal rub. Marked change from her usual baseline mental status. Notes Wound exam; the original wound on the right dorsal great toe has closed. This is fully epithelialized. This is a surprising but pleasant outcome nevertheless. Electronic Signature(s) Signed: 06/03/2018 5:32:05 PM By: Linton Ham MD Entered By: Linton Ham on 06/03/2018 12:22:25 Kuriakose, [name] ([id number]) -------------------------------------------------------------------------------- Physician Orders Details Patient Name: Kawano, Shavonn V. Date of Service: 06/03/2018 9:30 AM Medical Record Number: [id number] Patient Account Number: [id number] Date of Birth/Sex: 1941/03/02 (77 y.o. 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 Electronic Signature(s) Signed: 06/03/2018 12:25:48 PM By: Gretta Cool, BSN, RN, CWS, Kim RN, BSN Signed: 06/03/2018 5:32:05 PM By: Linton Ham MD Entered By: Gretta Cool, BSN, RN, CWS, Kim on 06/03/2018 12:25:47 Gladue, [name] ([id number]) -------------------------------------------------------------------------------- Problem List Details Patient Name: Polan, Coralynn V. Date of Service: 06/03/2018 9:30 AM Medical Record Number: [id number] Patient Account Number: [id number] Date of Birth/Sex: 09/04/1940 (77 y.o. 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 with 11/06/2016 No Yes unspecified severity E11.52 Type 2 diabetes mellitus with diabetic peripheral angiopathy 11/06/2016 No Yes with gangrene I70.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 of 11/06/2016  No Yes other part of foot F05 Delirium due to known physiological condition 06/03/2018 No Yes Inactive Problems ICD-10 Code Description Active Date Inactive Date L97.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 ([id number]) -------------------------------------------------------------------------------- Progress Note Details Patient Name: [name], [name] Date of Service: 06/03/2018 9:30 AM Medical Record Number: [id number] Patient Account Number: [id number] Date of Birth/Sex: 1941/07/02 (77 y.o. F) Treating RN: Cornell Barman Primary Care Provider: Beverlyn Roux Other Clinician: Referring Provider: Beverlyn Roux Treating Provider/Extender: Tito Dine in Treatment: 21 Subjective History of Present Illness (HPI) 08/13/16: This is a 77 year old woman who came predominantly for review of 3 cm in diameter circular wound to the left anterior lateral leg. She was in the ER on 08/01/16 I reviewed their notes. There was apparently pus coming out of the wound at that time and the patient arrived requesting debridement which they don't do in the emergency room. Nevertheless I can't see that they did any x-rays. There were no cultures done. She is a type II diabetic and I a note after the patient was in the clinic that she had a bypass graft from the popliteal to the tibial on the right on 02/28/16. She also had a right greater saphenous vein harvest on the same date for arterial bypass. She is going to have vascular studies including ABIs T ABIs on the right on 08/28/16. The patient's surgery was on 02/28/16 by Dr. Vallarie Mare she had a right below the knee popliteal artery to peroneal artery bypass with reverse greater saphenous vein and an endarterectomy of the mid segment peroneal artery. Postoperatively she had a strong mild monophasic peroneal signal with a pink foot. It would appear that the patient is had some  nonhealing in the surgical saphenous vein harvest site on the left leg. Surprisingly looking through [facility] I cannot see much information about this at all. Dr. Lucious Groves notes from 05/29/16 show that the patient's wounds "are not healed" the right first metatarsal wound healed but then opened back up. 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 At that point the patient had a wound VAC to her right calf with regards to the wounds on her dorsal right toe would appear that these are felt to be arterial wounds. She has had surgery on the metatarsal phalangeal in 2015 I Dr. Doran Durand secondary to a right metatarsal phalangeal joint fracture. She is apparently had discoloration around this area since then. 08/28/16; patient arrives with her wounds in much the same condition. The linear vein harvest site and the circular wound below it which I think was a blister. She also has to probing holes in her right great toe and a necrotic eschar on the right second toe. Because of these being arterial wounds I reduced her compression from 3-2 layers this seems to of done satisfactorily she has not had any problems. I cannot see that she is actually had an x-ray ====== 11/06/16 the patient comes in for evaluation of her right lower extremity ulcers. She was here in January 2018 for 2 visits subsequently ended up in the hospital with pneumonia and  then to rehabilitation. She has now been discharged from rehabilitation and is home. She has multiple ulcerations to her right lower extremity including the foot and toes. She does have home health in place and they have been placing alginate to the ulcers. She is followed by Dr. Bridgett Larsson of vascular medicine. 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 She recently saw him on 3/23. In office ABIs  were: Right 0.48 with monophasic flow to the DP, PT, peroneal Left 0.63 with monophasic flow to the DP and PT Her arterial studies indicated a patent right below knee popliteal to peroneal bypass She had an MRI in February 2008 that was negative frosty myelitis but this showed general soft tissue edema in the right foot and lower extremity concerning for cellulitis She is a diabetic, managed with insulin. Her hemoglobin A1c in December 2017 was 8.4 which is a trend up from previous levels. She had blood work in February 2018 which revealed an albumin of 2.6 this appears to be relatively acute as an albumin in November 2017 was 3.7 11/13/16; this is a patient I have not seen since February who is readmitted to our clinic last week. 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 Bridgett Larsson. I have reviewed Dr. Lianne Moris notes from March/23/18. Doppler ABI on that date showed an ABI on the right of 0.48 and on the left of 0.63. Dorsalis pedis waveforms were monophasic bilaterally. There was no waveforms detected at the posterior tibial on the right, monophasic on the left. Dr. Lianne Moris comments were that this patient would have follow-up vascular studies in 3 months including ABIs and right lower Oddo, Doniqua V. ([id number]) extremity arterial duplex. She had an MRI in February that was negative for osteomyelitis but showed generalized edema in the foot. Last albumin I see was in January at 3.4 we have been using Santyl to the 4 wounds on the right leg the patient is noted today to have widespread edema well up towards her groin this is pitting 2-3+. 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 Normal ejection fraction. 11/20/16; patient has a follow-up appointment with Dr. Bridgett Larsson on April 23. She is still complaining of a lot of pain in the right foot and right leg. It is not clear to me that  this is at all positional however I think it is clear claudication with minimal activity perhaps at rest. At our suggestion she is return to her primary physician's office tomorrow with regards to her pitting lower extremity bilateral edema that I reviewed in detail last week 11/27/16; the follow-up with Dr. Bridgett Larsson was actually on May 23 on April 23 as I stated in my note last week. N/A case all of her wounds seems somewhat smaller. The 2 on the right leg are definitely smaller. The areas on the dorsal right first toe, right third toe and the lateral part of the right fifth metatarsal head all looks smaller but have tightly adherent surfaces. We have been using Santyl 12/04/16; follow-up with Dr. Bridgett Larsson on May 23. 2 small open wounds on the right leg continue to get smaller. The area on the right third total lateral aspect of the right fifth toe also look better. The remaining area on the dorsal first toe still has some depth to it. We have been using  Santyl to the toes and collagen on the right 12/11/16; according to patient's husband the follow-up with Dr. Bridgett Larsson is not until July. All of the wounds on the right leg are measuring smaller. We have been using some combination of Prisma and Santyl although I think we can go to straight Prisma today. There may have been some confusion with home health about the primary dressing orders here. 12/25/16; the patient has had some healing this week. The area on her right lateral fifth metatarsal head, right third toe are both healed lower right leg is healed. In the vein harvest site superiorly she has one superficial open area. On the dorsal aspect of her right great toe/MTP joint the wound is now divided into 2 however the proximal area is deep and there is palpable bone 01/01/17; still an open area in the middle of her original right surgical scar. The area on the right third toe and right lateral fifth metatarsal head remained closed. Problematic area on the dorsal  aspect of the toe. Previous surgery in this area line 01/08/17 small open area on the original scar on her upper anterior leg although this is closing. X-ray I did of the right first toe did not show underlying bony abnormality. Still this area on the dorsal first toe probes to bone. We have been using Prisma 01/15/17; small open area in the original scar in the upper anterior leg is almost fully closed. She has 2 open areas over the dorsal aspect of the right first toe that probes to bone. Used and a form starting last week. Vigorous bone scraping that I did last week showed few methicillin sensitive staph aureus. She is allergic to penicillin and sulfa drugs. I'm going to give her 2 weeks of doxycycline. She will need an MRI with contrast. 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 She continues on doxycycline for a bone scraping that showed methicillin sensitive staph aureus [allergic to penicillin and sulfa]. We have been using Endo form to the wound. 01/29/17; surprisingly her MRI did not show osteomyelitis. She continues on doxycycline for a bone scraping that showed methicillin sensitive staph aureus with allergies to penicillin and sulfa. We have been using Endo form to the wound. Unfortunately I cannot get a surface on this visit looks like it is able to support a healing state. Proximally there is still exposed bone. There is no overt soft tissue tenderness. Her MRI did show a previous fracture was surgery to this area but no hardware 02/12/17 on evaluation today patient appears to be doing well in regard to her lower extremity wound. She does have some mild discomfort but this is minimal. There's no evidence of infection. Her left great toe nail has somewhat been lifted up and there is a little bit of slight bleeding underneath but this is still firmly attached. 02/19/17; she ran out of doxycycline 2 days ago.  Nevertheless I would like to continue this for 2 weeks to make a full 6 weeks of therapy as the bone scraping that I did from the open area on the dorsal right first toe showed MRSA. This should complete antibiotic therapy. 02/26/17; the patient is completing her 6 weeks of oral doxycycline. Deep wound on the dorsal right toe. This still has some exposed bone proximally. She does have a reasonably granulated surface albeit thin over bone. I've been using Endo form have made application  for an amniotic skin sub 03/05/17; the patient has completed 6 weeks of doxycycline. This is a deep wound on the dorsal right toe which has exposed bone proximally. She has granulation over most of this wound albeit a thin layer. I've been using Endo form. Still do not have approval for Affinity 03/13/17 on evaluation today patient's right foot wound appears to be doing better measurement wise compared to her last evaluation. She has been tolerating the dressing change without complication and does have a appointment with the dietary that has been made as far as referral is concerned there just waiting for her and transportation to contact them back for an actual date. Nonetheless patient has been having less pain at this point. No fevers, chills, nausea, or vomiting noted at this time. 03/26/17; linear wound over the dorsal aspect of her right great toe. At one point this had exposed bone on the most superior aspect however I am pleased to see today that this appears to have a surface of granulation. Apparently product was applied for but denied by [facility]. We'll need to see what that was. The patient has known PAD status post revascularization. MRI did not show osteomyelitis which was done in June [name], [name] ([id number]) 04/02/17; continued improvement using Endoform. She was approved for [employer] but hasn't over $562 co-pay per application, this is beyond her means 04/09/17; continues on Endoform. 04/16/17; appears to  be doing nicely continuing on Endoform 04/22/17; patient did not have her dressing changed all week because of the weather. Using Endoform. Base of the wound looks healthy elbow there appears to events surrounding maceration perhaps because of the drainage was not changing the wound dressing 04/30/17 wound today continues to close and except for a small divot on the superior part of the wound. This area did not probe the bone but I found this a little concerning as this was the area with exposed bone before we be able to get this to granulate forward. As I remember things she is not a candidate for skin substitutes secondary to an outlandish co-pay. I asked her husband to look into the out of pocket max for medications if he has 1 [Regranex] or totally for skin substitutes 05/07/17; the patient arrives today with a wound roughly the same size although under careful inspection under the light there appears to be some epithelialization. Her intake nurse noted that the Endoform seemed to be placed over the wound rather than in the deeper divots they could really benefit from the Endoform. At this point I have no plans to change the Endoform as at one point this was at least 33% exposed bone and the rest of the wound very close to the underlying phalanx 05/14/17; no major change in the size or appearance of the wound. We have been using Endoform for a prolonged period of time with reasonably good improvement in the epithelialization i.e. no exposed bone especially proximally. Unless we've not made a lot of changes in the last several weeks. She does not complain of pain. She was revascularized early this year or perhaps late last year by Dr. Bridgett Larsson and I wonder if he will need to have a look at her, I will need to review the actual vascular procedure which I think was a distal popliteal bypass 05/21/17; switched to either for a blue last week. Patient arrived in clinic with the wound not measuring any better  but the surface looking some better. Unfortunately she had some surface slough and  when I went to remove this superiorly she had exposed bone. Previously she had had exposed bone in the inferior part of the wound however this is granulated over some weeks ago. Clearly this is a major step back for her. With some difficulty I was able to obtain a piece of the bone probing through the superior part of the wound for culture. We did not send pathology. It is likely that she is going to need imaging of this site but I did not order this today 05/28/17; bone culture I took last week showed MSSA. She still has exposed bone however I could not get another piece for pathology. She had an MRI 4 months ago that did not show osteoarthritic at time. Wound rapidly deteriorated superiorly and I suspect this is underlying osteomyelitis. We have been using Hydrofera Blue 06/04/17 on evaluation today patient's wound appears to actually be doing a little bit better visually compared to last week's evaluation which is good news. Nonetheless she does have osteomyelitis of the toe which does not appear to be MRSA. No fevers, chills, nausea, or vomiting noted at this time. She is having no discomfort. 06/11/17; patient's wound looks a little healthier than I remember seeing it 2 weeks ago. There is no exposed bone and the surface of the wound appears well granulated. We've been using hydrofera blue. I note that she was started on doxycycline which was MSSA. Her appointment with Dr. Ola Spurr is on November 12 I believe. 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 Vallarie Mare of vascular surgery on 11/9. He offered right leg angiography possible intervention however the patient refused. I've discussed this with the patient's husband today she did not want any further procedures. Also noteworthy that Dr. Vallarie Mare stated that this wound had not healed in 3 years, I was not aware of this degree of chronicity  in this area. She has underlying osteomyelitis by bone culture. Culture of this grew MSSA, she is allergic to penicillin and therefore not a candidate for beta lactams. She saw Dr. Ola Spurr of infectious disease this morning, he continued her on doxycycline for another month and apparently sent in a prescription. We have been using Hydrofera Blue. ABI's update by Dr. Bridgett Larsson right 0.62, left 0.89. Monophasic wave forms 06/25/17 on evaluation today patient appears to be doing well in regard to her right great toe wound. She is still taking the doxycycline as prescribed by Dr. Ola Spurr. The Hydrofera Blue Dressing's to be doing as well as anything has for her up to this point and we are still waiting on an insurance authorization for the [place]. She is having no pain which is good news. No fevers, chills, nausea, or vomiting noted at this time. 07/02/17; still taking doxycycline as prescribed by Dr. Ola Spurr. Hydrofera Blue continues. We have no palpable bone. Still have not had had approval of Apligraf 08/06/16; the patient arrives today with [facility] even though we apparently had changed to Sorbact last time. Her co-pay for Regranex is $389 in discussion with the patient and her husband this was excessive. We'll continue with the Sorbact and standard dressings for now 08/20/17; we have been using sorbact. The wound bed still requires debridement. Wound measurements are slightly better. 09/03/17;using Sorbact.no debridement today. Wound measurements slightly better. There is some discoloration of the tip of her toelooks like bruising 09/17/17; using Sorbact. No debridement today. Wound dimensions are about the same. Still some discoloration at the tip of her great toe. This does not  look any worse than last time [name], [name] BETTY. ([id number]) 10/01/17; using sorbact right great toe I thought she might have surface epithelialization last time although it is certainly not look like that today  nevertheless her dimensions are better. Continued surface eschar on the tip of her toe but this is not progressing. She is actually complaining of the left great toe 10/15/17; this is a very difficult area on the dorsal aspect of her proximal right great toe. At one point this wound had exposed bone however we have managed to get some degree of epithelialization. She was revascularized by Dr. Vallarie Mare in [place]. She saw Dr. Ola Spurr for underlying osteomyelitis. She is not a candidate for advanced treatment options [insurance issues]. She comes in today with a new area on the tip of her right great toe. The exact history here is unclear. We have been using sorbact 10/29/17; patient arrives today with very significant bilateral increase edema which appears to extend into the thighs. This is tight and not particularly pitting however it looks a lot more impressive than I remember. Not surprisingly the wounds on her right great toe have increased in size with weeping edema fluid as the edema extends into the dorsal foot itself. She also has a wound on the tip of her right great toe which is a new wound from 2 weeks ago. We have been using sorbact. 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 He is on Demadex presumably twice a day at 20 mg. I have asked her family to make her an appointment with her primary physicians at the [facility] to go over this degree of edema. This is causing I think deterioration in the right great toe wound. She also has significant PAD and is status post right leg bypass graft. I think this was done by [facility]. I believe they also said that there was nothing further that could be done with regards to her vascular status. 11/12/17; patient is going to see her primary doctor this afternoon with regards to lower extremity edema. She has 2 open wounds on the right great toe  the original wound on the dorsal proximal part and then a more necrotic looking area on the tip of her right great toe. We took some time today to review her vascular status. The patient had a relate below knee popliteal to peroneal artery bypass with reversed greater saphenous vein on 06/13/17; she also had endarterectomy of the midsegment of the peroneal artery. 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 I think at some point in time after that she was offered an arteriogram on the right but she declined. I've used Silver collagen to her wounds recently. 11/26/17; patient has 2 open wounds on the right great toe tip as well as the dorsal aspect of her right great toe. She has known PAD. I've been trying to get her back to see vein and vascular in [facility] to see if there are options for endovascular surgery to help with perfusion although the patient and her husband may not want any more surgery. We've been using silver alginate because of maceration. She does not have an option for advanced treatment/skin substitutes 12/10/17; continued open wounds much the same as 2 weeks ago on the dorsal aspect of the right great toe on the  right great toe tip. I've encouraged to follow-up with Dr. Bridgett Larsson of vein and vascular to explore the possibility that there may be correctable ischemia involved in nonhealing these areas. We made considerable progress on the dorsal right great toe however it is stalled recently. We do not have an advanced treatment option. Originally this had exposed bone however there is a granulated base. 12/24/17; patient went back to see vein and vascular in [place]. She was seen by Manus Gunning NP. ABIs on the right were noted to be 0.84 and TBI of the right and 0.62. On the left her ABI was 0.82 and her TBI of 0.75. Waveforms were monophasic at the  posterior tibial and dorsalis pedis bilaterally. 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 Her husband reiterates today that she does not want an arteriogram therefore I'm not going to press the issue. We've been using silver collagen to the wounds on the tip of the right great toe and dorsal surface of the right great toe. 01/07/18; patient arrives today with the tip of her toe healed. An cerebral amount of slough over the wound on the dorsal toe. As noted on 12/24/17 no options currently for revascularization the patient will not allow an arteriogram 01/21/18; typical of her right great toe remains healed. Still large amount of slough over the wound on the dorsal toe. No options for revascularization. We've been using Medihoney 02/04/18; no major change. Still a punched out area on the right great toe dorsal wound. Covered in a difficult necrotic debris. 02/18/18; no major change. Still a punched out area over the right great toe. They're using [place] 03/04/18; no major change. The wound is stalled. Still a punched out area over the right dorsal great toe. We've been using medihone for about a month not much change in the wound dimensions or the characteristics of the wound surface. We've previously used Iodoflex, Hydrofera Blue, sorbact, and I think at one point [place]. She has PAD and does not have any revascularization options although a wound distally on the tip of the great toe did close over 03/25/18; absolutely no change. Still adherent debris over the wound surface there is no palpable bone but the wound still has some depth. This is predominantly a arterial wound and she basically has refused further attempts at revascularization. I would consider this a totally palliative situation except for she managed to heal the  wound at the tip of the same toe some [name], [name] ([id number]) months ago. I changed her back to Iodoflex The other issue is that [facility] has called and wants to pare back on the visits which have been 3 times a week. They will drop to 2 times a week next week 04/29/18; since the patient was last here she was admitted to hospital from 9/6 through 9/13. She was noted to have altered LOC, declining ability to walk. 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 She was discharged to [facility]. She is on oxygen chronically at 3 L. I don't think they've been doing anything specific to the wound. Her husband laments that she is no longer able to stand and walk or transfer. Yet he seems determined to take  her home 05/13/18; 2 week follow-up. The patient is still at [facility]. She arrives lethargic in clinic. She was also confused during her index hospitalization. She was felt to have metabolic encephalopathy secondary to UTI. Although she also had hypercapnic respiratory failure secondary to COPD and sleep apnea. She is on chronic oxygen. Paradoxically her wound actually looks a lot better and this is really filled in nicely. We're using silver collagen 06/03/18 3 week follow-up. The [facility] but was supposed to be discharged home today. She arrives in clinic virtually unresponsive. Her wound is actually healed Objective Constitutional Sitting or standing Blood Pressure is within target range for patient.. Pulse regular and within target range for patient.[name] Respirations regular, non-labored and within target range.. Temperature is normal and within the target range for the patient.. Patient arrives in clinic minimally responsive. Vitals Time Taken: 9:55 AM, Height: 64 in, Weight: 200 lbs, BMI: 34.3,  Temperature: 99.0 F, Pulse: 100 bpm, Respiratory Rate: 18 breaths/min, Blood Pressure: 100/87 mmHg, Capillary Blood Glucose: 336 mg/dl. General Notes: 50 upon arrival - 4L O2 Eyes Eyes were closed. Respiratory Above normal respiratory effort noted. Respiratory rate elveaated. Bibasilar crackles. Cardiovascular Heart sounds are distant. Gastrointestinal (GI) Abdomen is soft perhaps some left CVA tenderness it was not reproducible. Genitourinary (GU) No clear suprapubic tenderness. Neurological Patient arrives in clinic minimally responsive. She seemed to have a head bobbing tremor and also an episode tremor of her right hand. Nothing overtly lateralizing. Psychiatric Patient is lethargic and doesn't even respond to the sternal rub. Marked change from her usual baseline mental status. [name], [name] ([id number]) General Notes: Wound exam; the original wound on the right dorsal great toe has closed. This is fully epithelialized. This is a surprising but pleasant outcome nevertheless. Integumentary (Hair, Skin) No rash was seen. Wound #7 status is Open. Original cause of wound was Gradually Appeared. The wound is located on the Right,Dorsal Metatarsal head first. The wound measures 0.1cm length x 0.1cm width x 0.1cm depth; 0.008cm^2 area and 0.001cm^3 volume. There is no tunneling or undermining noted. There is a none present amount of drainage noted. The wound margin is distinct with the outline attached to the wound base. There is no granulation within the wound bed. There is no necrotic tissue within the wound bed. The periwound skin appearance had no abnormalities noted for texture. The periwound skin appearance had no abnormalities noted for color. The periwound skin appearance exhibited: Maceration. Periwound temperature was noted as No Abnormality. The periwound has tenderness on palpation. 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 Measured CBG in our clinic was 326 certainly not at cause of clear mental status changes. #2 we initially put a pulse ox on her at 50%. With 4 L of oxygen she became more responsive. I note that she has a history of COPD and sleep apnea with perhaps CO2 narcosis. She appears to be in acute respiratory failure as a cause of her altered mental status. I also wondered whether she could be having focal seizures #3 I did not have a drug list to review #4 After discussing  at length with her husband we are going to send the patient the ER. We called EMS by the time they got here she was somewhat more arousable but seemed to fade out and then come back around. #5 there is no way that she can be sent home what this Electronic Signature(s) Signed: 06/03/2018 5:32:05 PM By: Linton Ham MD Entered By: Linton Ham on 06/03/2018 12:25:30 [name], [name] METHOT ([id number]) Frees, [name] V. ([id number]) -------------------------------------------------------------------------------- SuperBill Details Patient Name: Garciagarcia, Ashni V. Date of Service: 06/03/2018 Medical Record Number: [id number] Patient Account Number: [id number] Date of Birth/Sex: 21-Sep-1940 (77 y.o. F) Treating RN: Cornell Barman Primary Care Provider: Beverlyn Roux Other Clinician: Referring Provider: Beverlyn Roux Treating Provider/Extender: Tito Dine in Treatment: 82 Diagnosis Coding ICD-10 Codes Code Description E11.622 Type 2 diabetes mellitus with other skin ulcer E11.621 Type 2 diabetes mellitus with foot ulcer L97.519 Non-pressure chronic ulcer of  other part of right foot with unspecified severity E11.52 Type 2 diabetes mellitus with diabetic peripheral angiopathy with gangrene I70.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 of foot F05 Delirium due to known physiological condition Physician Procedures CPT4 Code Description: [id number] - WC PHYS LEVEL 4 - EST PT ICD-10 Diagnosis Description F05 Delirium due to known physiological condition L97.519 Non-pressure chronic ulcer of other part of right foot with unspe Modifier: cified severit Quantity: 1 y Engineer, maintenance) Signed: 06/03/2018 5:32:05 PM By: Linton Ham MD Entered By: Linton Ham on 06/03/2018 12:26:06

## 2018-06-05 NOTE — Progress Notes (Signed)
Name: [name] MRN: [id number] DOB: 1941-08-03     CONSULTATION DATE: 06/03/2018 Subjective & objectives: Improved mental status and tolerating nasal cannula  PAST MEDICAL HISTORY :   has a past medical history of Acute heart failure ([place]), Aortic stenosis, CHF (congestive heart failure) ([place]), Complication of anesthesia, Diabetes mellitus without complication ([place]), Diabetic neuropathy ([place]), Edema of both legs, GERD (gastroesophageal reflux disease), High cholesterol, HTN (hypertension), Hypertension, PAD (peripheral artery disease) ([place]), Shortness of breath dyspnea, Spasm of back muscles, Stroke ([place]), and Wound, open.  has a past surgical history that includes Intramedullary (im) nail intertrochanteric (Left, 02/04/2014); ORIF toe fracture (Right, 02/08/2014); Cardiac catheterization (N/A, 12/28/2015); Cyst excision; Eye surgery (Bilateral); Bypass graft popliteal to tibial (Right, 02/28/2016); Vein harvest (Right, 02/28/2016); Radiology with anesthesia (N/A, 03/01/2016); ir generic historical (03/01/2016); ir generic historical (03/01/2016); ir generic historical (03/01/2016); and ir generic historical (06/03/2016). Prior to Admission medications   Medication Sig Start Date End Date Taking? Authorizing Provider  acetaminophen (TYLENOL) 500 MG tablet Take 1,000 mg by mouth every 6 (six) hours as needed for mild pain.   Yes [provider]  albuterol (PROVENTIL HFA;VENTOLIN HFA) 108 (90 Base) MCG/ACT inhaler Inhale 2 puffs into the lungs every 6 (six) hours as needed for wheezing or shortness of breath.    Yes [provider]  aspirin 325 MG tablet Take 1 tablet (325 mg total) by mouth daily with breakfast. Patient taking differently: Take 325 mg by mouth daily.  07/10/16  Yes Holley Raring, MD  atorvastatin (LIPITOR) 20 MG tablet Take 1 tablet (20 mg total) by mouth daily. 03/13/16  Yes Virgina Jock A, PA-C  carvedilol (COREG) 6.25 MG tablet Take 6.25 mg by mouth 2 (two) times  daily.    Yes [provider]  cetirizine (ZYRTEC) 10 MG tablet Take 10 mg by mouth daily.    Yes [provider]  cholecalciferol (VITAMIN D) 1000 units tablet Take 2,000 Units by mouth daily.    Yes [provider]  clopidogrel (PLAVIX) 75 MG tablet Take 1 tablet (75 mg total) by mouth daily with breakfast. 03/04/17  Yes Conrad , MD  docusate sodium (COLACE) 100 MG capsule Take 100 mg by mouth 2 (two) times daily.   Yes [provider]  fluticasone (FLONASE) 50 MCG/ACT nasal spray Place 1 spray into both nostrils every evening.    Yes [provider]  insulin aspart (NOVOLOG) 100 UNIT/ML injection Inject 0-15 Units into the skin 3 (three) times daily with meals. Before each meal 3 times a day, 140-199 - 3 units, 200-250 - 5 units, 251-299 - 8 units,  300-349 - 12 units,  350 or above 15 units. Patient taking differently: Inject 14 Units into the skin 3 (three) times daily with meals.  02/09/14  Yes Thurnell Lose, MD  insulin glargine (LANTUS) 100 UNIT/ML injection Inject 0.27 mLs (27 Units total) into the skin at bedtime. Patient taking differently: Inject 40 Units into the skin at bedtime.  09/16/16  Yes Reyne Dumas, MD  isosorbide dinitrate (ISORDIL) 30 MG tablet Take 30 mg by mouth daily.  04/16/17  Yes [provider]  nitroGLYCERIN (NITROSTAT) 0.4 MG SL tablet Place 1 tablet (0.4 mg total) under the tongue every 5 (five) minutes as needed for chest pain. 07/09/16  Yes Holley Raring, MD  nystatin ointment (MYCOSTATIN) Apply 1 application topically 3 (three) times daily. Apply to area below breasts   Yes [provider]  omeprazole (PRILOSEC) 20 MG  capsule Take 1 capsule (20 mg total) by mouth daily. 04/17/18  Yes Sainani, Belia Heman, MD  polyethylene glycol (MIRALAX / GLYCOLAX) packet Take 17 g by mouth 2 (two) times daily. 02/09/14  Yes Thurnell Lose, MD  potassium chloride SA (K-DUR,KLOR-CON) 20 MEQ tablet Take 20 mEq by mouth  daily.   Yes [provider]  pregabalin (LYRICA) 50 MG capsule Take 50 mg by mouth 2 (two) times daily.    Yes [provider]  torsemide (DEMADEX) 20 MG tablet Take 2 tablets (40 mg total) by mouth 2 (two) times daily. 04/17/18  Yes Sainani, Belia Heman, MD  VITAMIN E PO Take 1 capsule by mouth daily.   Yes [provider]   Allergies  Allergen Reactions  . Iodine Anaphylaxis  . Penicillins Anaphylaxis    Tolerates ceftriaxone, cefazolin Has patient had a PCN reaction causing immediate rash, facial/tongue/throat swelling, SOB or lightheadedness with hypotension: Unknown Has patient had a PCN reaction causing severe rash involving mucus membranes or skin necrosis: Unknown Has patient had a PCN reaction that required hospitalization: Unknown Has patient had a PCN reaction occurring within the last 10 years: Unknown If all of the above answers are "NO", then may proceed with Cephalosporin u  . Shellfish Allergy Anaphylaxis  . Shellfish-Derived Products Anaphylaxis  . Sulfa Antibiotics Anaphylaxis  . Sulfacetamide Sodium Anaphylaxis  . Sulfasalazine Anaphylaxis  . Morphine And Related Other (See Comments)    Altered mental status    FAMILY HISTORY:  family history includes CVA in her father; Diabetes in her father and other; Hypertension in her father; Liver disease in her mother. SOCIAL HISTORY:  reports that she has never smoked. She has never used smokeless tobacco. She reports that she does not drink alcohol or use drugs.  REVIEW OF SYSTEMS:   Unable to obtain due to critical illness   VITAL SIGNS: Temp:  [98.6 F (37 C)-98.8 F (37.1 C)] 98.8 F (37.1 C) (11/01 0900) Pulse Rate:  [75-91] 77 (11/01 1300) Resp:  [13-28] 23 (11/01 1300) BP: (103-155)/(26-67) 128/46 (11/01 1300) SpO2:  [94 %-100 %] 100 % (11/01 0742) FiO2 (%):  [30 %] 30 % (11/01 0201)   Physical Examination:  Awake, disoriented, following commands Spontaneously moving all  extremities.  No acute focal motor deficits Tolerating the cannula, able to talk in full sentences, no distress, bilateral equal air entry and no adventitious sounds S1 & S2 are audible with no murmur Benign abdominal exam with normal peristalsis Wasted extremities no edema was paresthesia of both lower extremities   A  & P  Acute on chronic respiratory failure was baseline obstructive sleep apnea.  Tolerating BiPAP -Try off BiPAP on the nasal cannula and optimize O2 therapy to keep O2 sat 88 to 90%  Altered mental status (improved) with toxic metabolic encephalopathy and hypercarbic respiratory failure.  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 -Monitor neuro status  AKI, mild hyperkalemia and hyponatremia with intravascular volume depletion. -Optimize volume, avoid nephrotoxins, monitor renal panel and urine output.  Elevated troponin more likely due to demand versus supply mismatch, no chest pain no acute specific ST segment changes on EKG. echocardiogram 04/2018 LVEF 55 to 60% no wall motion abnormalities -Monitor cardiac enzymes  UTI.  E. Coli ESBL -Start on Meropenem, d/c cefepime and follow final culture results  Atelectasis and pneumonia.  Bibasilar airspace disease -Start on Meropenem, d/c cefepime, DC vancomycin with MRSA  PCR negative -Monitor CXR + CBC + FiO2  Right upper lobe lung nodule on the CT chest.  Possible infective/inflammatory/neoplastic etiology -Pulmonary consult after resolution of acute phase of illness and follow-up with CT scan in 6 months  Sepsis -Empiric antibiotic, monitor pro count and cultures  Anemia -Keep hemoglobin more than 7 g/dL  Diabetes mellitus -Glycemic control  Hypertension -Optimize antihypertensives and monitor hemodynamics  Full code  DVT & GI prophylaxis.  Continue with supportive care  Family was updated and  they agreed to the plan of care Critical care time 35 minutes

## 2018-06-05 NOTE — Progress Notes (Addendum)
Pharmacy Antibiotic Note  [name] is a 77 y.o. female admitted on 06/03/2018 with sepsis secondary to pneumonia and UTI. Of note, patient was hospitalized last month for UTI (9/6 UCx grew E. Coli, pansensitive; treated with CTX x7 days) and was sent to [facility] since then. Pharmacy has been consulted for meropenem dosing.  Plan: Per discussions with CCM, switch cefepime to meropenem 1 g IV q8h today due to the urinary culture from 10/30 growing E. Coli and susceptibility results (ESBL resistant; sensitive to imipenem).  Height: 5' (152.4 cm) Weight: 221 lb 1.9 oz (100.3 kg) IBW/kg (Calculated) : 45.5  Temp (24hrs), Avg:98.8 F (37.1 C), Min:98.6 F (37 C), Max:98.9 F (37.2 C)  Recent Labs  Lab 06/03/18 1122 06/03/18 1651 06/04/18 0116 06/05/18 0531  WBC 16.8*  --  14.2* 9.5  CREATININE 1.08*  --  0.89 0.87  LATICACIDVEN 1.9 2.0*  --   --     Estimated Creatinine Clearance: 59.4 mL/min (by C-G formula based on SCr of 0.87 mg/dL).    Allergies  Allergen Reactions  . Iodine Anaphylaxis  . Penicillins Anaphylaxis    Tolerates ceftriaxone, cefazolin Has patient had a PCN reaction causing immediate rash, facial/tongue/throat swelling, SOB or lightheadedness with hypotension: Unknown Has patient had a PCN reaction causing severe rash involving mucus membranes or skin necrosis: Unknown Has patient had a PCN reaction that required hospitalization: Unknown Has patient had a PCN reaction occurring within the last 10 years: Unknown If all of the above answers are "NO", then may proceed with Cephalosporin u  . Shellfish Allergy Anaphylaxis  . Shellfish-Derived Products Anaphylaxis  . Sulfa Antibiotics Anaphylaxis  . Sulfacetamide Sodium Anaphylaxis  . Sulfasalazine Anaphylaxis  . Morphine And Related Other (See Comments)    Altered mental status    Antimicrobials this admission: Levofloxacin 750 mg IV x 1 in ED Vancomycin 1000 mg x 1, 1250 mg x 1   Cefepime  10/30 >> 11/1 Meropenem 11/1 >>   Dose adjustments this admission:   Microbiology results: 10/30 BCx: no growth x 48 hours            BCx (anerobic bottle only): GPR, pending   10/30 UCx: >=100,000 [name] (ESBL resistant; sensitive to imipenem)      10/30 MRSA PCR: negative   Thank you for allowing pharmacy to be a part of this patient's care.  [place], [place].D. Candidate  06/05/2018 11:09 AM     11/4 [facility] called with 2nd bottle [name], K[facility](-). Already on meropenem.  Sim Boast, PharmD, BCPS  06/08/18 4:35 AM

## 2018-06-05 NOTE — Care Management (Addendum)
Patient co-pay for Trilogy unit is $219.80/month. RNCM attempted to meet with patient she remained sleeping.  Patient may meet criteria for home NIV/Trilogy unit.  I spoke with [facility]UP and he suggests that pulmonologist make that call and that patient may benefit from CPAP which is require sleep study in outpatient. Form for Trilogy left on patient's chart for pulmonologist review.  Patient expected to transition to acute care level of hospital.

## 2018-06-05 NOTE — Progress Notes (Signed)
Rate to 16 per keene .j do

## 2018-06-05 NOTE — Progress Notes (Signed)
Palliative:  I went in to visit for [place] with Ms. Ransdell and her husband at bedside. However, he husband tells me that he has to leave for a doctor's appointment for himself in [place] and will not be back before I leave for the day. Consult was not complete as patient unable to participate and fixated on getting water, iced tea, and eating her lunch. Earliest consult date will be Monday 06/08/18. Recommend outpatient palliative consult for further discussions.   No charge  Vinie Sill, NP Palliative Medicine Team Pager # [phone] (M-F 8a-5p) Team Phone # [phone] (Nights/Weekends)

## 2018-06-05 NOTE — Progress Notes (Signed)
Patient on Bipap for approximately 15 minutes before she took Bipap mask off, Refused to put Bipap mask back on. Patient is currently on 2L [place]. RN aware.

## 2018-06-05 NOTE — Progress Notes (Signed)
[name]    MR#:  [id number]  DATE OF BIRTH:  13-Mar-1941  SUBJECTIVE:  CHIEF COMPLAINT:   Chief Complaint  Patient presents with  . Respiratory Distress  Off BiPAP, tolerating N.C. REVIEW OF SYSTEMS:  Review of Systems  Constitutional: Negative for chills, fever and weight loss.  HENT: Negative for nosebleeds and sore throat.   Eyes: Negative for blurred vision.  Respiratory: Positive for shortness of breath. Negative for cough and wheezing.   Cardiovascular: Negative for chest pain, orthopnea, leg swelling and PND.  Gastrointestinal: Negative for abdominal pain, constipation, diarrhea, heartburn, nausea and vomiting.  Genitourinary: Negative for dysuria and urgency.  Musculoskeletal: Negative for back pain.  Skin: Negative for rash.  Neurological: Negative for dizziness, speech change, focal weakness and headaches.  Endo/Heme/Allergies: Does not bruise/bleed easily.  Psychiatric/Behavioral: Negative for depression.   DRUG ALLERGIES:   Allergies  Allergen Reactions  . Iodine Anaphylaxis  . Penicillins Anaphylaxis    Tolerates ceftriaxone, cefazolin Has patient had a PCN reaction causing immediate rash, facial/tongue/throat swelling, SOB or lightheadedness with hypotension: Unknown Has patient had a PCN reaction causing severe rash involving mucus membranes or skin necrosis: Unknown Has patient had a PCN reaction that required hospitalization: Unknown Has patient had a PCN reaction occurring within the last 10 years: Unknown If all of the above answers are "NO", then may proceed with Cephalosporin u  . Shellfish Allergy Anaphylaxis  . Shellfish-Derived Products Anaphylaxis  . Sulfa Antibiotics Anaphylaxis  . Sulfacetamide Sodium Anaphylaxis  . Sulfasalazine Anaphylaxis  . Morphine And Related Other (See Comments)    Altered mental status   VITALS:  Blood pressure (!) 105/43, pulse 80, temperature  98.8 F (37.1 C), temperature source Axillary, resp. rate (!) 25, height 5' (1.524 m), weight 100.3 kg, SpO2 100 %. PHYSICAL EXAMINATION:  Physical Exam  Constitutional: She is oriented to person, place, and time. She appears lethargic.  HENT:  Head: Normocephalic and atraumatic.  Eyes: Pupils are equal, round, and reactive to light. Conjunctivae and EOM are normal.  Neck: Normal range of motion. Neck supple. No tracheal deviation present. No thyromegaly present.  Cardiovascular: Normal rate, regular rhythm and normal heart sounds.  Pulmonary/Chest: Effort normal and breath sounds normal. No respiratory distress. She has no wheezes. She exhibits no tenderness.  Abdominal: Soft. Bowel sounds are normal. She exhibits no distension. There is no tenderness.  Musculoskeletal: Normal range of motion.  Neurological: She is oriented to person, place, and time. She appears lethargic. No cranial nerve deficit.  Skin: Skin is warm and dry. No rash noted.   LABORATORY PANEL:  Female CBC Recent Labs  Lab 06/05/18 0531  WBC 9.5  HGB 11.5*  HCT 38.9  PLT 133*   ------------------------------------------------------------------------------------------------------------------ Chemistries  Recent Labs  Lab 06/03/18 1122  06/05/18 0531  NA 146*   < > 151*  K 5.3*   < > 4.2  CL 97*   < > 102  CO2 36*   < > 39*  GLUCOSE 340*   < > 210*  BUN 36*   < > 41*  CREATININE 1.08*   < > 0.87  CALCIUM 9.5   < > 9.6  AST 16  --   --   ALT 10  --   --   ALKPHOS 72  --   --   BILITOT 1.6*  --   --    < > = values in this  interval not displayed.   RADIOLOGY:  Dg Chest Port 1 View  Result Date: 06/05/2018 CLINICAL DATA:  Pneumonia. EXAM: PORTABLE CHEST 1 VIEW COMPARISON:  Radiograph of June 04, 2018. FINDINGS: Stable cardiomegaly. No pneumothorax is noted. Stable mild bibasilar subsegmental atelectasis is noted with small pleural effusions. Bony thorax is unremarkable. IMPRESSION: Stable mild  bibasilar subsegmental atelectasis with small pleural effusions. Electronically Signed   By: Marijo Conception, M.D.   On: 06/05/2018 07:21   ASSESSMENT AND PLAN:   *Acute on chronic respiratory failure with hypoxia and hypercapnia: off BiPAP and on N.C. Secondary to healthcare associated pneumonia  * Sepsis: present on admission due to UTI, PNA  * ESBL UTI - meropenem  * Pneumonia - Meropenem  * Acute metabolic encephalopathy - CT head neg  * ARF - resolved with hydration  * elevated troponins - due to supply demand ischemia  * Chronic diastolic congestive heart failure Currently no exacerbation symptoms, will monitor.  * Hypernatremia: consider D5     All the records are reviewed and case discussed with Care Management/Social Worker. Management plans discussed with the patient, nursing and they are in agreement.  CODE STATUS: Full Code  TOTAL TIME TAKING CARE OF THIS PATIENT: 15 minutes.   More than 50% of the time was spent in counseling/coordination of care: YES  POSSIBLE D/C IN 2-3 DAYS, DEPENDING ON CLINICAL CONDITION.   Max Sane M.D on 06/05/2018 at 3:23 PM  Between 7am to 6pm - Pager - [phone]  After 6pm go to [email or web address] - Technical brewer [facility]  Office  [phone]  CC: Primary care physician; [name], MD  Note: This dictation was prepared with Dragon dictation along with smaller phrase technology. Any transcriptional errors that result from this process are unintentional.

## 2018-06-06 LAB — GLUCOSE, CAPILLARY
GLUCOSE-CAPILLARY: 144 mg/dL — AB (ref 70–99)
GLUCOSE-CAPILLARY: 182 mg/dL — AB (ref 70–99)
GLUCOSE-CAPILLARY: 197 mg/dL — AB (ref 70–99)
GLUCOSE-CAPILLARY: 240 mg/dL — AB (ref 70–99)
Glucose-Capillary: 387 mg/dL — ABNORMAL HIGH (ref 70–99)
Glucose-Capillary: 329 mg/dL — ABNORMAL HIGH (ref 70–99)
Glucose-Capillary: 256 mg/dL — ABNORMAL HIGH (ref 70–99)

## 2018-06-06 LAB — BASIC METABOLIC PANEL
Anion gap: 8 (ref 5–15)
BUN: 33 mg/dL — AB (ref 8–23)
CALCIUM: 9 mg/dL (ref 8.9–10.3)
CO2: 36 mmol/L — ABNORMAL HIGH (ref 22–32)
CREATININE: 0.72 mg/dL (ref 0.44–1.00)
Chloride: 98 mmol/L (ref 98–111)
GFR calc Af Amer: >60 mL/min (ref >60)
GLUCOSE: 349 mg/dL — AB (ref 70–99)
Potassium: 4.6 mmol/L (ref 3.5–5.1)
SODIUM: 142 mmol/L (ref 135–145)

## 2018-06-06 LAB — PROCALCITONIN: Procalcitonin: 0.77 ng/mL

## 2018-06-06 MED ORDER — IPRATROPIUM-ALBUTEROL 0.5-2.5 (3) MG/3ML IN SOLN
3.0000 mL | Freq: Three times a day (TID) | RESPIRATORY_TRACT | Status: DC
  Administered 2018-06-06 – 2018-06-10 (×12): 3 mL via RESPIRATORY_TRACT
  Filled 2018-06-06 (×12): qty 3

## 2018-06-06 MED ORDER — INSULIN GLARGINE 100 UNIT/ML ~~LOC~~ SOLN
35.0000 [IU] | Freq: Every day | SUBCUTANEOUS | Status: DC
  Administered 2018-06-06 – 2018-06-07 (×2): 35 [IU] via SUBCUTANEOUS
  Filled 2018-06-06 (×3): qty 0.35

## 2018-06-06 MED ORDER — IPRATROPIUM-ALBUTEROL 0.5-2.5 (3) MG/3ML IN SOLN: 3.0000 mL | RESPIRATORY_TRACT | Status: DC | PRN

## 2018-06-06 NOTE — Progress Notes (Signed)
[place] at [place] NAME: [name]    MR#:  [id number]  DATE OF BIRTH:  [birth year]  SUBJECTIVE:  CHIEF COMPLAINT:   Chief Complaint  Patient presents with  . Respiratory Distress   Patient doing better breathing improved  REVIEW OF SYSTEMS:  Review of Systems  Constitutional: Negative for chills, fever and weight loss.  HENT: Negative for nosebleeds and sore throat.   Eyes: Negative for blurred vision.  Respiratory: Negative for cough, shortness of breath and wheezing.   Cardiovascular: Negative for chest pain, orthopnea, leg swelling and PND.  Gastrointestinal: Negative for abdominal pain, constipation, diarrhea, heartburn, nausea and vomiting.  Genitourinary: Negative for dysuria and urgency.  Musculoskeletal: Negative for back pain.  Skin: Negative for rash.  Neurological: Negative for dizziness, speech change, focal weakness and headaches.  Endo/Heme/Allergies: Does not bruise/bleed easily.  Psychiatric/Behavioral: Negative for depression.   DRUG ALLERGIES:   Allergies  Allergen Reactions  . Iodine Anaphylaxis  . Penicillins Anaphylaxis    Tolerates ceftriaxone, cefazolin Has patient had a PCN reaction causing immediate rash, facial/tongue/throat swelling, SOB or lightheadedness with hypotension: Unknown Has patient had a PCN reaction causing severe rash involving mucus membranes or skin necrosis: Unknown Has patient had a PCN reaction that required hospitalization: Unknown Has patient had a PCN reaction occurring within the last 10 years: Unknown If all of the above answers are "NO", then may proceed with Cephalosporin u  . Shellfish Allergy Anaphylaxis  . Shellfish-Derived Products Anaphylaxis  . Sulfa Antibiotics Anaphylaxis  . Sulfacetamide Sodium Anaphylaxis  . Sulfasalazine Anaphylaxis  . Morphine And Related Other (See Comments)    Altered mental status   VITALS:  Blood pressure (!) 154/62, pulse 71,  temperature 98.6 F (37 C), temperature source Oral, resp. rate 20, height 5' (1.524 m), weight 100.3 kg, SpO2 100 %. PHYSICAL EXAMINATION:  Physical Exam  Constitutional: She is oriented to person, place, and time.  HENT:  Head: Normocephalic and atraumatic.  Eyes: Pupils are equal, round, and reactive to light. Conjunctivae and EOM are normal.  Neck: Normal range of motion. Neck supple. No tracheal deviation present. No thyromegaly present.  Cardiovascular: Normal rate, regular rhythm and normal heart sounds.  Pulmonary/Chest: Effort normal and breath sounds normal. No respiratory distress. She has no wheezes. She exhibits no tenderness.  Abdominal: Soft. Bowel sounds are normal. She exhibits no distension. There is no tenderness.  Musculoskeletal: Normal range of motion.  Neurological: She is oriented to person, place, and time. No cranial nerve deficit.  Skin: Skin is warm and dry. No rash noted.   LABORATORY PANEL:  Female CBC Recent Labs  Lab 06/05/18 0531  WBC 9.5  HGB 11.5*  HCT 38.9  PLT 133*   ------------------------------------------------------------------------------------------------------------------ Chemistries  Recent Labs  Lab 06/03/18 1122  06/06/18 1050  NA 146*   < > 142  K 5.3*   < > 4.6  CL 97*   < > 98  CO2 36*   < > 36*  GLUCOSE 340*   < > 349*  BUN 36*   < > 33*  CREATININE 1.08*   < > 0.72  CALCIUM 9.5   < > 9.0  AST 16  --   --   ALT 10  --   --   ALKPHOS 72  --   --   BILITOT 1.6*  --   --    < > = values in this interval not displayed.   RADIOLOGY:  No results found. ASSESSMENT AND PLAN:   *Acute on chronic respiratory failure with hypoxia and hypercapnia: Due to cystitis and pneumonia, continue IV antibiotics patient is doing better   * Sepsis: present on admission due to pneumonia and UTI continue IV meropenem  * ESBL UTI - meropenem  * Pneumonia - Meropenem Follow procalcitonin levels  * Acute metabolic encephalopathy -  CT head neg Now improving  * ARF - resolved with hydration  * elevated troponins - due to supply demand ischemia  * Chronic diastolic congestive heart failure Currently no exacerbation symptoms, will monitor.  * Hypernatremia: Sodium now normal  *Diabetes type 2 continue sliding-scale insulin and Lantus sugars elevated I will adjust her Lantus  All the records are reviewed and case discussed with Care Management/Social Worker. Management plans discussed with the patient, nursing and they are in agreement.  CODE STATUS: Full Code  TOTAL TIME TAKING CARE OF THIS PATIENT: 35 minutes   More than 50% of the time was spent in counseling/coordination of care: YES  POSSIBLE D/C IN 2-3 DAYS, DEPENDING ON CLINICAL CONDITION.   Dustin Flock M.D on 06/06/2018 at 12:39 PM  Between 7am to 6pm - Pager - [phone]  After 6pm go to [email or web address] - Technical brewer [facility]  Office  [phone]  CC: Primary care physician; [name], MD  Note: This dictation was prepared with Dragon dictation along with smaller phrase technology. Any transcriptional errors that result from this process are unintentional.

## 2018-06-06 NOTE — Progress Notes (Addendum)
ARBADELLA, KIMBLER ([id number]) Visit Report for 06/03/2018 Arrival Information Details Patient Name: [name], [name] Date of Service: 06/03/2018 9:30 AM Medical Record Number: [id number] Patient Account Number: [id number] Date of Birth/Sex: 30-Oct-1940 (77 y.o. F) Treating RN: [name] Primary Care [name]: [name] Other Clinician: Referring [name]: [name] Treating [name]/Extender: [name] in Treatment: 57 Visit Information History Since Last Visit Added or deleted any medications: No Patient Arrived: Wheel Chair Any new allergies or adverse reactions: No Arrival Time: 09:53 Had a fall or experienced change in No activities of daily living that may affect Accompanied By: husband risk of falls: Transfer Assistance: None Signs or symptoms of abuse/neglect since last visito No Patient Requires Transmission-Based No Hospitalized since last visit: No Precautions: Implantable device outside of the clinic excluding No Patient Has Alerts: No cellular tissue based products placed in the center since last visit: Has Dressing in Place as Prescribed: Yes Pain Present Now: No Electronic Signature(s) Signed: 06/03/2018 4:40:44 PM By: Lorine Bears RCP, RRT, CHT Entered By: Lorine Bears on 06/03/2018 09:56:48 [name], [name] ([id number]) -------------------------------------------------------------------------------- Clinic Level of Care Assessment Details Patient Name: [name], [name] Date of Service: 06/03/2018 9:30 AM Medical Record Number: [id number] Patient Account Number: [id number] Date of Birth/Sex: [birth year] (77 y.o. F) Treating RN: [name] Primary Care [name]: [name] Other Clinician: Referring [name]: [name] Treating [name]/Extender: [name] in Treatment: 6 Clinic Level of Care Assessment Items TOOL 4 Quantity Score []  - Use when only an EandM is performed on FOLLOW-UP visit  0 ASSESSMENTS - Nursing Assessment / Reassessment []  - Reassessment of Co-morbidities (includes updates in patient status) 0 []  - 0 Reassessment of Adherence to Treatment Plan ASSESSMENTS - Wound and Skin Assessment / Reassessment X - Simple Wound Assessment / Reassessment - one wound 1 5 []  - 0 Complex Wound Assessment / Reassessment - multiple wounds []  - 0 Dermatologic / Skin Assessment (not related to wound area) ASSESSMENTS - Focused Assessment []  - Circumferential Edema Measurements - multi extremities 0 []  - 0 Nutritional Assessment / Counseling / Intervention []  - 0 Lower Extremity Assessment (monofilament, tuning fork, pulses) []  - 0 Peripheral Arterial Disease Assessment (using hand held doppler) ASSESSMENTS - Ostomy and/or Continence Assessment and Care []  - Incontinence Assessment and Management 0 []  - 0 Ostomy Care Assessment and Management (repouching, etc.) PROCESS - Coordination of Care X - Simple Patient / Family Education for ongoing care 1 15 []  - 0 Complex (extensive) Patient / Family Education for ongoing care []  - 0 Staff obtains Programmer, systems, Records, Test Results / Process Orders []  - 0 Staff telephones HHA, Nursing Homes / Clarify orders / etc []  - 0 Routine Transfer to another Facility (non-emergent condition) []  - 0 Routine Hospital Admission (non-emergent condition) []  - 0 New Admissions / Biomedical engineer / Ordering NPWT, Apligraf, etc. X- 1 20 Emergency Hospital Admission (emergent condition) []  - 0 Simple Discharge Coordination Waltermire, [name] ([id number]) []  - 0 Complex (extensive) Discharge Coordination PROCESS - Special Needs []  - Pediatric / Minor Patient Management 0 []  - 0 Isolation Patient Management []  - 0 Hearing / Language / Visual special needs []  - 0 Assessment of Community assistance (transportation, D/C planning, etc.) []  - 0 Additional assistance / Altered mentation []  - 0 Support Surface(s) Assessment (bed,  cushion, seat, etc.) INTERVENTIONS - Wound Cleansing / Measurement X - Simple Wound Cleansing - one wound 1 5 []  - 0 Complex Wound Cleansing - multiple wounds  X- 1 5 Wound Imaging (photographs - any number of wounds) []  - 0 Wound Tracing (instead of photographs) X- 1 5 Simple Wound Measurement - one wound []  - 0 Complex Wound Measurement - multiple wounds INTERVENTIONS - Wound Dressings []  - Small Wound Dressing one or multiple wounds 0 []  - 0 Medium Wound Dressing one or multiple wounds []  - 0 Large Wound Dressing one or multiple wounds []  - 0 Application of Medications - topical []  - 0 Application of Medications - injection INTERVENTIONS - Miscellaneous []  - External ear exam 0 []  - 0 Specimen Collection (cultures, biopsies, blood, body fluids, etc.) []  - 0 Specimen(s) / Culture(s) sent or taken to Lab for analysis []  - 0 Patient Transfer (multiple staff / Civil Service fast streamer / Similar devices) []  - 0 Simple Staple / Suture removal (25 or less) []  - 0 Complex Staple / Suture removal (26 or more) []  - 0 Hypo / Hyperglycemic Management (close monitor of Blood Glucose) []  - 0 Ankle / Brachial Index (ABI) - do not check if billed separately X- 1 5 Vital Signs [name], [name] ([id number]) Has the patient been seen at the hospital within the last three years: Yes Total Score: 60 Level Of Care: New/Established - Level 2 Electronic Signature(s) Signed: 06/04/2018 7:26:08 AM By: [name], BSN, RN, CWS, Kim RN, BSN Entered By: [name], BSN, RN, CWS, Kim on 06/03/2018 12:26:28 [name], [name] ([id number]) -------------------------------------------------------------------------------- Encounter Discharge Information Details Patient Name: [name], [name] Date of Service: 06/03/2018 9:30 AM Medical Record Number: [id number] Patient Account Number: [id number] Date of Birth/Sex: Apr 25, 1941 (77 y.o. F) Treating RN: [name] Primary Care Kimon Loewen: [name] Other Clinician: Referring  [name]: [name] Treating [name]/Extender: [name] in Treatment: 54 Encounter Discharge Information Items Discharge Condition: Unstable Ambulatory Status: Stretcher Discharge Destination: Emergency Room Transportation: Ambulance Accompanied By: EMS and Husband Schedule Follow-up Appointment: Yes Clinical Summary of Care: Electronic Signature(s) Signed: 06/03/2018 12:27:45 PM By: [name], BSN, RN, CWS, Kim RN, BSN Entered By: [name], BSN, RN, CWS, Kim on 06/03/2018 12:27:44 [name], [name] ([id number]) -------------------------------------------------------------------------------- Multi Wound Chart Details Patient Name: [name], [name] Date of Service: 06/03/2018 9:30 AM Medical Record Number: [id number] Patient Account Number: [id number] Date of Birth/Sex: [birth year] (77 y.o. F) Treating RN: [name] Primary Care Kalima Saylor: [name] Other Clinician: Referring Dail Lerew: [name] Treating Orlinda Slomski/Extender: [name] in Treatment: 24 Vital Signs Height(in): 64 Capillary Blood Glucose 336 (mg/dl): Weight(lbs): 200 Pulse(bpm): 100 Body Mass Index(BMI): 34 Blood Pressure(mmHg): 100/87 Temperature(F): 99.0 Respiratory Rate 18 (breaths/min): Photos: [N/A:N/A] Wound Location: Right Metatarsal head first - N/A N/A Dorsal Wounding Event: Gradually Appeared N/A N/A Primary Etiology: Diabetic Wound/Ulcer of the N/A N/A Lower Extremity Comorbid History: Cataracts, Chronic sinus N/A N/A problems/congestion, Congestive Heart Failure, Hypertension, Type II Diabetes Date Acquired: 08/06/2013 N/A N/A Weeks of Treatment: 82 N/A N/A Wound Status: Open N/A N/A Measurements L x W x D 0.1x0.1x0.1 N/A N/A (cm) Area (cm) : 0.008 N/A N/A Volume (cm) : 0.001 N/A N/A % Reduction in Area: 99.30% N/A N/A % Reduction in Volume: 99.70% N/A N/A Classification: Grade 2 N/A N/A Exudate Amount: None Present N/A N/A Wound Margin: Distinct, outline  attached N/A N/A Granulation Amount: None Present (0%) N/A N/A Necrotic Amount: None Present (0%) N/A N/A Exposed Structures: Fascia: No N/A N/A Fat Layer (Subcutaneous Tissue) Exposed: No Tendon: No Muscle: No Darroch, Zalea V. ([id number]) Joint: No Bone: No Epithelialization: None N/A N/A Periwound Skin Texture: No Abnormalities Noted N/A  N/A Periwound Skin Moisture: Maceration: Yes N/A N/A Periwound Skin Color: No Abnormalities Noted N/A N/A Temperature: No Abnormality N/A N/A Tenderness on Palpation: Yes N/A N/A Wound Preparation: Ulcer Cleansing: N/A N/A Rinsed/Irrigated with Saline Topical Anesthetic Applied: Other: lidocaine 4% Treatment Notes Electronic Signature(s) Signed: 06/03/2018 12:24:33 PM By: [name], BSN, RN, CWS, Kim RN, BSN Entered By: [name], BSN, RN, CWS, Kim on 06/03/2018 12:24:33 [name], [name] ([id number]) -------------------------------------------------------------------------------- [place] Details Patient Name: [name], [name] Date of Service: 06/03/2018 9:30 AM Medical Record Number: [id number] Patient Account Number: [id number] Date of Birth/Sex: Aug 03, 1941 (77 y.o. F) Treating RN: [name] Primary Care Aashish Hamm: [name] Other Clinician: Referring Shihab States: [name] Treating Sriya Kroeze/Extender: [name] in Treatment: 85 Active Inactive Electronic Signature(s) Signed: 07/13/2018 10:44:15 AM By: [name], BSN, RN, CWS, Kim RN, BSN Previous Signature: 06/03/2018 12:24:07 PM Version By: [name], BSN, RN, CWS, Kim RN, BSN Entered By: [name], BSN, RN, CWS, Kim on 07/13/2018 10:44:14 [name], [name] ([id number]) -------------------------------------------------------------------------------- Pain Assessment Details Patient Name: [name], [name] Date of Service: 06/03/2018 9:30 AM Medical Record Number: [id number] Patient Account Number: [id number] Date of Birth/Sex: [birth year] (77 y.o. F) Treating RN: [name] Primary Care Wallace Cogliano: [name] Other Clinician: Referring Raji Glinski: [name] Treating Dava Rensch/Extender: [name] in Treatment: 33 Active Problems Location of Pain Severity and Description of Pain Patient Has Paino No Site Locations Pain Management and Medication Current Pain Management: Electronic Signature(s) Signed: 06/03/2018 4:40:44 PM By: Lorine Bears RCP, RRT, CHT Signed: 06/04/2018 7:26:08 AM By: [name], BSN, RN, CWS, Kim RN, BSN Entered By: Lorine Bears on 06/03/2018 09:57:02 Covello, Novella [name] ([id number]) -------------------------------------------------------------------------------- Patient/Caregiver Education Details Patient Name: [name], [name] Date of Service: 06/03/2018 9:30 AM Medical Record Number: [id number] Patient Account Number: [id number] Date of Birth/Gender: 1941-06-26 (77 y.o. F) Treating RN: [name] Primary Care Physician: [name] Other Clinician: Referring Physician: Beverlyn White Treating Physician/Extender: [name] in Treatment: 27 Education Assessment Education Provided To: Caregiver Education Topics Provided Notes Patient sent [facility] ED by EMS. Electronic Signature(s) Signed: 06/04/2018 7:26:08 AM By: [name], BSN, RN, CWS, Kim RN, BSN Entered By: [name], BSN, RN, CWS, Kim on 06/03/2018 12:27:16 Wojtkiewicz, Maressa [name] ([id number]) -------------------------------------------------------------------------------- Wound Assessment Details Patient Name: Voigt, Natisha V. Date of Service: 06/03/2018 9:30 AM Medical Record Number: [id number] Patient Account Number: [id number] Date of Birth/Sex: 1941-05-29 (77 y.o. F) Treating RN: Secundino Ginger Primary Care Barron Vanloan: [name] Other Clinician: Referring Soren Lazarz: [name] Treating Hisayo Delossantos/Extender: [name] in Treatment: 82 Wound Status Wound Number: 7 Primary Diabetic Wound/Ulcer of the Lower  Extremity Etiology: Wound Location: Right Metatarsal head first - Dorsal Wound Open Wounding Event: Gradually Appeared Status: Date Acquired: 08/06/2013 Comorbid Cataracts, Chronic sinus problems/congestion, Weeks Of Treatment: 82 History: Congestive Heart Failure, Hypertension, Type II Clustered Wound: No Diabetes Photos Photo Uploaded By: Secundino Ginger on 06/03/2018 10:22:10 Wound Measurements Length: (cm) 0.1 Width: (cm) 0.1 Depth: (cm) 0.1 Area: (cm) 0.008 Volume: (cm) 0.001 % Reduction in Area: 99.3% % Reduction in Volume: 99.7% Epithelialization: None Tunneling: No Undermining: No Wound Description Classification: Grade 2 Wound Margin: Distinct, outline attached Exudate Amount: None Present Foul Odor After Cleansing: No Slough/Fibrino No Wound Bed Granulation Amount: None Present (0%) Exposed Structure Necrotic Amount: None Present (0%) Fascia Exposed: No Fat Layer (Subcutaneous Tissue) Exposed: No Tendon Exposed: No Muscle Exposed: No Joint Exposed: No Bone Exposed: No Periwound Skin Texture Texture Color No Abnormalities Noted: Yes No  Abnormalities Noted: Yes Ferrara, Nakisha V. ([id number]) Moisture Temperature / Pain No Abnormalities Noted: No Temperature: No Abnormality Maceration: Yes Tenderness on Palpation: Yes Wound Preparation Ulcer Cleansing: Rinsed/Irrigated with Saline Topical Anesthetic Applied: Other: lidocaine 4%, Electronic Signature(s) Signed: 06/03/2018 3:57:54 PM By: Secundino Ginger Entered By: Secundino Ginger on 06/03/2018 10:08:22 Finnan, [name] ([id number]) -------------------------------------------------------------------------------- Vitals Details Patient Name: Vanzee, Cyrilla V. Date of Service: 06/03/2018 9:30 AM Medical Record Number: [id number] Patient Account Number: [id number] Date of Birth/Sex: Mar 13, 1941 (77 y.o. F) Treating RN: [name] Primary Care Chenita Ruda: [name] Other Clinician: Referring Prisca Gearing: [name] Treating  Zandyr Barnhill/Extender: [name] in Treatment: 20 Vital Signs Time Taken: 09:55 Temperature (F): 99.0 Height (in): 64 Pulse (bpm): 100 Weight (lbs): 200 Respiratory Rate (breaths/min): 18 Body Mass Index (BMI): 34.3 Blood Pressure (mmHg): 100/87 Capillary Blood Glucose (mg/dl): 336 Reference Range: 80 - 120 mg / dl Notes 50 upon arrival - 4L O2 Electronic Signature(s) Signed: 06/04/2018 7:26:08 AM By: [name], BSN, RN, CWS, Kim RN, BSN Entered By: [name], BSN, RN, CWS, Kim on 06/03/2018 10:41:55

## 2018-06-07 LAB — GASTROINTESTINAL PANEL BY PCR, STOOL (REPLACES STOOL CULTURE)
ADENOVIRUS F40/41: NOT DETECTED
Astrovirus: NOT DETECTED
CAMPYLOBACTER SPECIES: NOT DETECTED
CRYPTOSPORIDIUM: NOT DETECTED
CYCLOSPORA CAYETANENSIS: NOT DETECTED
ENTAMOEBA HISTOLYTICA: NOT DETECTED
ENTEROPATHOGENIC E COLI (EPEC): NOT DETECTED
Enteroaggregative E coli (EAEC): NOT DETECTED
Enterotoxigenic E coli (ETEC): NOT DETECTED
Giardia lamblia: NOT DETECTED
Norovirus GI/GII: NOT DETECTED
PLESIMONAS SHIGELLOIDES: NOT DETECTED
Rotavirus A: NOT DETECTED
SAPOVIRUS (I, II, IV, AND V): NOT DETECTED
Salmonella species: NOT DETECTED
Shiga like toxin producing E coli (STEC): NOT DETECTED
Shigella/Enteroinvasive E coli (EIEC): NOT DETECTED
VIBRIO CHOLERAE: NOT DETECTED
VIBRIO SPECIES: NOT DETECTED
YERSINIA ENTEROCOLITICA: NOT DETECTED

## 2018-06-07 LAB — BASIC METABOLIC PANEL
Anion gap: 7 (ref 5–15)
BUN: 28 mg/dL — ABNORMAL HIGH (ref 8–23)
CALCIUM: 9.1 mg/dL (ref 8.9–10.3)
CO2: 37 mmol/L — AB (ref 22–32)
CREATININE: 0.6 mg/dL (ref 0.44–1.00)
Chloride: 98 mmol/L (ref 98–111)
GFR calc Af Amer: >60 mL/min (ref >60)
GFR calc non Af Amer: >60 mL/min (ref >60)
GLUCOSE: 189 mg/dL — AB (ref 70–99)
Potassium: 3.6 mmol/L (ref 3.5–5.1)
Sodium: 142 mmol/L (ref 135–145)

## 2018-06-07 LAB — C DIFFICILE QUICK SCREEN W PCR REFLEX
C DIFFICILE (CDIFF) TOXIN: NEGATIVE
C DIFFICLE (CDIFF) ANTIGEN: NEGATIVE
C Diff interpretation: NOT DETECTED

## 2018-06-07 LAB — GLUCOSE, CAPILLARY
GLUCOSE-CAPILLARY: 216 mg/dL — AB (ref 70–99)
GLUCOSE-CAPILLARY: 281 mg/dL — AB (ref 70–99)
Glucose-Capillary: 190 mg/dL — ABNORMAL HIGH (ref 70–99)
Glucose-Capillary: 147 mg/dL — ABNORMAL HIGH (ref 70–99)
Glucose-Capillary: 233 mg/dL — ABNORMAL HIGH (ref 70–99)
Glucose-Capillary: 296 mg/dL — ABNORMAL HIGH (ref 70–99)

## 2018-06-07 LAB — CBC
HEMATOCRIT: 36.6 % (ref 36.0–46.0)
Hemoglobin: 11 g/dL — ABNORMAL LOW (ref 12.0–15.0)
MCH: 26.5 pg (ref 26.0–34.0)
MCHC: 30.1 g/dL (ref 30.0–36.0)
MCV: 88.2 fL (ref 80.0–100.0)
Platelets: 139 10*3/uL — ABNORMAL LOW (ref 150–400)
RBC: 4.15 MIL/uL (ref 3.87–5.11)
RDW: 15.2 % (ref 11.5–15.5)
WBC: 10.8 10*3/uL — ABNORMAL HIGH (ref 4.0–10.5)
nRBC: 0 % (ref 0.0–0.2)

## 2018-06-07 NOTE — Progress Notes (Signed)
Placed patient on 2l [place] after neb treatment

## 2018-06-07 NOTE — Progress Notes (Signed)
[facility] NAME: [name]    MR#:  [id number]  DATE OF BIRTH:  09-Jun-1941  SUBJECTIVE:  CHIEF COMPLAINT:   Chief Complaint  Patient presents with  . Respiratory Distress   Patient hard of hearing   REVIEW OF SYSTEMS:  Review of Systems  Constitutional: Negative for chills, fever and weight loss.  HENT: Negative for nosebleeds and sore throat.   Eyes: Negative for blurred vision.  Respiratory: Negative for cough, shortness of breath and wheezing.   Cardiovascular: Negative for chest pain, orthopnea, leg swelling and PND.  Gastrointestinal: Negative for abdominal pain, constipation, diarrhea, heartburn, nausea and vomiting.  Genitourinary: Negative for dysuria and urgency.  Musculoskeletal: Negative for back pain.  Skin: Negative for rash.  Neurological: Negative for dizziness, speech change, focal weakness and headaches.  Endo/Heme/Allergies: Does not bruise/bleed easily.  Psychiatric/Behavioral: Negative for depression.   DRUG ALLERGIES:   Allergies  Allergen Reactions  . Iodine Anaphylaxis  . Penicillins Anaphylaxis    Tolerates ceftriaxone, cefazolin Has patient had a PCN reaction causing immediate rash, facial/tongue/throat swelling, SOB or lightheadedness with hypotension: Unknown Has patient had a PCN reaction causing severe rash involving mucus membranes or skin necrosis: Unknown Has patient had a PCN reaction that required hospitalization: Unknown Has patient had a PCN reaction occurring within the last 10 years: Unknown If all of the above answers are "NO", then may proceed with Cephalosporin u  . Shellfish Allergy Anaphylaxis  . Shellfish-Derived Products Anaphylaxis  . Sulfa Antibiotics Anaphylaxis  . Sulfacetamide Sodium Anaphylaxis  . Sulfasalazine Anaphylaxis  . Morphine And Related Other (See Comments)    Altered mental status   VITALS:  Blood pressure (!) 138/49, pulse 72, temperature 98.6 F  (37 C), temperature source Oral, resp. rate 19, height 5' (1.524 m), weight 100.3 kg, SpO2 95 %. PHYSICAL EXAMINATION:  Physical Exam  Constitutional: She is oriented to person, place, and time.  HENT:  Head: Normocephalic and atraumatic.  Eyes: Pupils are equal, round, and reactive to light. Conjunctivae and EOM are normal.  Neck: Normal range of motion. Neck supple. No tracheal deviation present. No thyromegaly present.  Cardiovascular: Normal rate, regular rhythm and normal heart sounds.  Pulmonary/Chest: Effort normal and breath sounds normal. No respiratory distress. She has no wheezes. She exhibits no tenderness.  Abdominal: Soft. Bowel sounds are normal. She exhibits no distension. There is no tenderness.  Musculoskeletal: Normal range of motion.  Neurological: She is oriented to person, place, and time. No cranial nerve deficit.  Skin: Skin is warm and dry. No rash noted.   LABORATORY PANEL:  Female CBC Recent Labs  Lab 06/07/18 0336  WBC 10.8*  HGB 11.0*  HCT 36.6  PLT 139*   ------------------------------------------------------------------------------------------------------------------ Chemistries  Recent Labs  Lab 06/03/18 1122  06/07/18 0336  NA 146*   < > 142  K 5.3*   < > 3.6  CL 97*   < > 98  CO2 36*   < > 37*  GLUCOSE 340*   < > 189*  BUN 36*   < > 28*  CREATININE 1.08*   < > 0.60  CALCIUM 9.5   < > 9.1  AST 16  --   --   ALT 10  --   --   ALKPHOS 72  --   --   BILITOT 1.6*  --   --    < > = values in this interval not displayed.   RADIOLOGY:  No results found. ASSESSMENT AND PLAN:   *Acute on chronic respiratory failure with hypoxia and hypercapnia: Due to UTI and pneumonia, continue IV antibiotics patient is doing better  * Sepsis: present on admission due to pneumonia and UTI continue IV meropenem  * ESBL UTI - meropenem  * Pneumonia - Meropenem -Repeat chest x-ray in the morning  * Acute metabolic encephalopathy - CT head neg Now  improving appears to be close to her baseline I know her from before  * ARF - resolved with hydration  * elevated troponins - due to supply demand ischemia  * Chronic diastolic congestive heart failure Currently no exacerbation symptoms, will monitor.  * Hypernatremia: Sodium now normal  *Diabetes type 2 continue sliding-scale insulin and Lantus   All the records are reviewed and case discussed with Care Management/Social Worker. Management plans discussed with the patient, nursing and they are in agreement.  CODE STATUS: Full Code  TOTAL TIME TAKING CARE OF THIS PATIENT: 35 minutes   More than 50% of the time was spent in counseling/coordination of care: YES  POSSIBLE D/C IN 2-3 DAYS, DEPENDING ON CLINICAL CONDITION.   Dustin Flock M.D on 06/07/2018 at 11:53 AM  Between 7am to 6pm - Pager - [phone]  After 6pm go to [email or web address] - Technical brewer [facility]  Office  [phone]  CC: Primary care physician; [name], MD  Note: This dictation was prepared with Dragon dictation along with smaller phrase technology. Any transcriptional errors that result from this process are unintentional.

## 2018-06-07 NOTE — Evaluation (Signed)
Physical Therapy Evaluation Patient Details Name: [name] MRN: [id number] DOB: 1941/07/26 Today's Date: 06/07/2018   History of Present Illness  77 yo female with onset of weakness and sepsis from PNA and atelectasis was admitted for care.  Has contact precautions, recent UTI with respiratory failure.  PMHx:  CHF, aortic stenosis, DM, PN, PAD, stroke, HTN, UTI with respiratory failure Oct 2019  Clinical Impression  Pt was seen for initial mobility and note her difficulty with transition to side of bed and resistance to stand.  Pt is confused and has trouble with directional cues, limited insight into what mobility will require.  Pt has agreed to try to stand then retracted the approval, then when PT returned her to bed asked when she is getting up.  Has unusual grinding of her teeth and lethargic appearance but asked for gum to stop herself from rubbing teeth together.  Follow acutely for mobility as pt will permit and continue to progress her to the chair as able.  Pt sits with poor balance but can control with sufficient cues, but will not let PT assist her to stand or use RW to attempt it.      Follow Up Recommendations SNF    Equipment Recommendations  Rolling walker with 5" wheels    Recommendations for Other Services       Precautions / Restrictions Precautions Precautions: Fall Precaution Comments: telemetry Restrictions Weight Bearing Restrictions: No      Mobility  Bed Mobility Overal bed mobility: Needs Assistance Bed Mobility: Supine to Sit;Sit to Supine     Supine to sit: Max assist Sit to supine: Max assist   General bed mobility comments: pt is resisting PT to sit up then tries to help briefly  Transfers                 General transfer comment: pt refused to let PT assist her after PT had her set up with walker and then with PT to directly assist, refusing unless more people were there in room  Ambulation/Gait             General Gait  Details: unable  Stairs            Wheelchair Mobility    Modified Rankin (Stroke Patients Only)       Balance Overall balance assessment: History of Falls;Needs assistance Sitting-balance support: Feet supported;Bilateral upper extremity supported Sitting balance-Leahy Scale: Poor                                       Pertinent Vitals/Pain Pain Assessment: No/denies pain    Home Living Family/patient expects to be discharged to:: Skilled nursing facility                 Additional Comments: pt able to give very little information about PLOF    Prior Function Level of Independence: Needs assistance   Gait / Transfers Assistance Needed: pt states she was using RW and walking  ADL's / Homemaking Assistance Needed: no information from pt        Hand Dominance        Extremity/Trunk Assessment   Upper Extremity Assessment Upper Extremity Assessment: Generalized weakness    Lower Extremity Assessment Lower Extremity Assessment: Generalized weakness    Cervical / Trunk Assessment Cervical / Trunk Assessment: Kyphotic  Communication   Communication: HOH  Cognition Arousal/Alertness: Lethargic Behavior During  Therapy: Impulsive Overall Cognitive Status: No family/caregiver present to determine baseline cognitive functioning                                 General Comments: pt is making comments that are contradictory about what she wants and is grinding her teeth but reports she cannot do anything about it.  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  Pt is having pain with pressure of compression garments on lower legs    Exercises     Assessment/Plan     PT Assessment Patient needs continued PT services  PT Problem List Decreased strength;Decreased range of motion;Decreased activity tolerance;Decreased balance;Decreased mobility;Decreased coordination;Decreased cognition;Decreased knowledge of use of DME;Decreased safety awareness;Decreased knowledge of precautions;Cardiopulmonary status limiting activity       PT Treatment Interventions DME instruction;Functional mobility training;Therapeutic activities;Therapeutic exercise;Balance training;Neuromuscular re-education;Patient/family education    PT Goals (Current goals can be found in the Care Plan section)       Frequency Min 2X/week   Barriers to discharge Inaccessible home environment;Decreased caregiver support home with insufficient help to transfer and get up from bed    Co-evaluation               AM-PAC PT "6 Clicks" Daily Activity  Outcome Measure Difficulty turning over in bed (including adjusting bedclothes, sheets and blankets)?: Unable Difficulty moving from lying on back to sitting on the side of the bed? : Unable Difficulty sitting down on and standing up from a chair with arms (e.g., wheelchair, bedside commode, etc,.)?: Unable Help needed moving to and from a bed to chair (including a wheelchair)?: A Lot Help needed walking in hospital room?: Total Help needed climbing 3-5 steps with a railing? : Total 6 Click Score: 7    End of Session Equipment Utilized During Treatment: Gait belt;Oxygen Activity Tolerance: Patient limited by fatigue;Treatment limited secondary to medical complications (Comment) Patient left: in bed;with call bell/phone within reach;with bed alarm set Nurse Communication: Mobility status;Other (comment)(pt limitations for movement with self limiting behavior) PT Visit Diagnosis: Muscle weakness (generalized) (M62.81);Difficulty in walking, not elsewhere classified (R26.2);Other abnormalities of gait and mobility (R26.89);Adult, failure to  thrive (R62.7)    Time: 0950-1030 PT Time Calculation (min) (ACUTE ONLY): 40 min   Charges:   PT Evaluation $PT Eval Moderate Complexity: 1 Mod PT Treatments $Therapeutic Exercise: 8-22 mins $Therapeutic Activity: 23-37 mins       Ramond Dial 06/07/2018, 5:36 PM   Mee Hives, PT MS Acute Rehab Dept. Number: [place] and [place]

## 2018-06-08 ENCOUNTER — Inpatient Hospital Stay: Payer: Medicare HMO

## 2018-06-08 LAB — BASIC METABOLIC PANEL
Anion gap: 4 — ABNORMAL LOW (ref 5–15)
BUN: 21 mg/dL (ref 8–23)
CALCIUM: 9.1 mg/dL (ref 8.9–10.3)
CO2: 40 mmol/L — ABNORMAL HIGH (ref 22–32)
CREATININE: 0.56 mg/dL (ref 0.44–1.00)
Chloride: 97 mmol/L — ABNORMAL LOW (ref 98–111)
GFR calc Af Amer: >60 mL/min (ref >60)
GLUCOSE: 199 mg/dL — AB (ref 70–99)
Potassium: 4 mmol/L (ref 3.5–5.1)
Sodium: 141 mmol/L (ref 135–145)

## 2018-06-08 LAB — CBC
HCT: 37.8 % (ref 36.0–46.0)
Hemoglobin: 11.6 g/dL — ABNORMAL LOW (ref 12.0–15.0)
MCH: 27.2 pg (ref 26.0–34.0)
MCHC: 30.7 g/dL (ref 30.0–36.0)
MCV: 88.5 fL (ref 80.0–100.0)
PLATELETS: 142 10*3/uL — AB (ref 150–400)
RBC: 4.27 MIL/uL (ref 3.87–5.11)
RDW: 15 % (ref 11.5–15.5)
WBC: 14.2 10*3/uL — ABNORMAL HIGH (ref 4.0–10.5)
nRBC: 0 % (ref 0.0–0.2)

## 2018-06-08 LAB — BLOOD CULTURE ID PANEL (REFLEXED)
Acinetobacter baumannii: NOT DETECTED
CANDIDA TROPICALIS: NOT DETECTED
Candida albicans: NOT DETECTED
Candida glabrata: NOT DETECTED
Candida krusei: NOT DETECTED
Candida parapsilosis: NOT DETECTED
Carbapenem resistance: NOT DETECTED
Enterobacter cloacae complex: NOT DETECTED
Enterobacteriaceae species: DETECTED — AB
Enterococcus species: NOT DETECTED
Escherichia coli: DETECTED — AB
HAEMOPHILUS INFLUENZAE: NOT DETECTED
Klebsiella oxytoca: NOT DETECTED
Klebsiella pneumoniae: NOT DETECTED
Listeria monocytogenes: NOT DETECTED
Neisseria meningitidis: NOT DETECTED
Proteus species: NOT DETECTED
Pseudomonas aeruginosa: NOT DETECTED
SERRATIA MARCESCENS: NOT DETECTED
STAPHYLOCOCCUS SPECIES: NOT DETECTED
STREPTOCOCCUS AGALACTIAE: NOT DETECTED
STREPTOCOCCUS SPECIES: NOT DETECTED
Staphylococcus aureus (BCID): NOT DETECTED
Streptococcus pneumoniae: NOT DETECTED
Streptococcus pyogenes: NOT DETECTED

## 2018-06-08 LAB — GLUCOSE, CAPILLARY
GLUCOSE-CAPILLARY: 181 mg/dL — AB (ref 70–99)
GLUCOSE-CAPILLARY: 200 mg/dL — AB (ref 70–99)
GLUCOSE-CAPILLARY: 319 mg/dL — AB (ref 70–99)
GLUCOSE-CAPILLARY: 283 mg/dL — AB (ref 70–99)
Glucose-Capillary: 154 mg/dL — ABNORMAL HIGH (ref 70–99)

## 2018-06-08 LAB — CULTURE, BLOOD (ROUTINE X 2): Culture: NO GROWTH

## 2018-06-08 LAB — PROCALCITONIN: Procalcitonin: 0.27 ng/mL

## 2018-06-08 MED ORDER — INSULIN ASPART 100 UNIT/ML ~~LOC~~ SOLN
3.0000 [IU] | Freq: Three times a day (TID) | SUBCUTANEOUS | Status: DC
  Administered 2018-06-08 – 2018-06-10 (×6): 3 [IU] via SUBCUTANEOUS
  Filled 2018-06-08 (×6): qty 1

## 2018-06-08 MED ORDER — MODAFINIL 100 MG PO TABS
100.0000 mg | ORAL_TABLET | Freq: Every day | ORAL | Status: DC
  Administered 2018-06-08 – 2018-06-10 (×3): 100 mg via ORAL
  Filled 2018-06-08 (×3): qty 1

## 2018-06-08 MED ORDER — INSULIN GLARGINE 100 UNIT/ML ~~LOC~~ SOLN
40.0000 [IU] | Freq: Every day | SUBCUTANEOUS | Status: DC
  Administered 2018-06-08 – 2018-06-09 (×2): 40 [IU] via SUBCUTANEOUS
  Filled 2018-06-08 (×3): qty 0.4

## 2018-06-08 MED ORDER — INSULIN ASPART 100 UNIT/ML ~~LOC~~ SOLN
0.0000 [IU] | Freq: Three times a day (TID) | SUBCUTANEOUS | Status: DC
  Administered 2018-06-08: 11 [IU] via SUBCUTANEOUS
  Administered 2018-06-09: 8 [IU] via SUBCUTANEOUS
  Administered 2018-06-09 (×2): 3 [IU] via SUBCUTANEOUS
  Administered 2018-06-10: 2 [IU] via SUBCUTANEOUS
  Filled 2018-06-08 (×5): qty 1

## 2018-06-08 MED ORDER — INSULIN ASPART 100 UNIT/ML ~~LOC~~ SOLN
0.0000 [IU] | Freq: Every day | SUBCUTANEOUS | Status: DC
  Administered 2018-06-08 – 2018-06-09 (×2): 3 [IU] via SUBCUTANEOUS
  Filled 2018-06-08 (×2): qty 1

## 2018-06-08 NOTE — Care Management Important Message (Signed)
Important Message  Patient Details  Name: OMER MONTER MRN: [id number] Date of Birth: January 17, 1941   Medicare Important Message Given:  Yes    Shelbie Ammons, RN 06/08/2018, 11:53 AM

## 2018-06-08 NOTE — Progress Notes (Signed)
Palliative Note:  Referral received. I spoke with patient's husband and son. Son is sick and he or patient will not be able to come up to the hospital today. Husband states he may be able to come up to visit after 6pm today once daughter is available. I introduced myself and Palliative to family. They verbalized understanding.   Husband request [place] meeting tomorrow 11/5 @ 10am.   Detailed notes and recommendations to follow.   Thank you for your referral.   Alda Lea, AGPCNP-BC Palliative Medicine Team  Phone: [phone] Fax: [phone] Pager: [phone] Amion: N. [place]

## 2018-06-08 NOTE — Progress Notes (Signed)
BIPAP ordered for pnt to wear overnight however at approximately 345am BIPAP alarm began sounding. Assessed pnt and she demanded that I take BIPAP off of her because she thinks it is making her not be able to breathe. Removed BIPAP and put back on O2. Oxygen saturation is stable.   Called respiratory therapy who said patient has been intermittently refusing BIPAP and that he would see her when he rounds.

## 2018-06-08 NOTE — Progress Notes (Signed)
Inpatient Diabetes Program Recommendations  AACE/ADA: New Consensus Statement on Inpatient Glycemic Control (2015)  Target Ranges:  Prepandial:   less than 140 mg/dL      Peak postprandial:   less than 180 mg/dL (1-2 hours)      Critically ill patients:  140 - 180 mg/dL   Lab Results  Component Value Date   GLUCAP 200 (H) 06/08/2018   HGBA1C 8.8 (H) 04/11/2018    Review of Glycemic ControlResults for CHARMAYNE, ODELL (MRN [id number]) as of 06/08/2018 13:26  Ref. Range 06/07/2018 07:55 06/07/2018 11:52 06/07/2018 16:48 06/07/2018 20:07 06/07/2018 23:58 06/08/2018 04:04 06/08/2018 07:47 06/08/2018 12:03  Glucose-Capillary Latest Ref Range: 70 - 99 mg/dL 147 (H) 233 (H) 281 (H) 296 (H) 216 (H) 181 (H) 154 (H) 200 (H)   Diabetes history: DM 2 Outpatient Diabetes medications: Novolog 14 units tid with meals, Lantus 40 units q HS Current orders for Inpatient glycemic control:  Novolog moderate q 4 hours, Lantus 35 units q HS Inpatient Diabetes Program Recommendations:   May consider changing Novolog correction to tid with meals and HS.  Also consider increasing Lantus to 40 units q HS. Also consider adding Novolog meal coverage 3 units tid with meals.    Thanks, Adah [name], RN, BC-ADM Inpatient Diabetes Coordinator Pager [phone] (8a-5p)

## 2018-06-08 NOTE — Progress Notes (Signed)
[place] at [place] NAME: Exa Bomba    MR#:  [id number]  DATE OF BIRTH:  1941-03-02  SUBJECTIVE:  CHIEF COMPLAINT:   Chief Complaint  Patient presents with  . Respiratory Distress  Patient continues to be intermittently drowsy.  REVIEW OF SYSTEMS:  Review of Systems  Unable to perform ROS: Acuity of condition   DRUG ALLERGIES:   Allergies  Allergen Reactions  . Iodine Anaphylaxis  . Penicillins Anaphylaxis    Tolerates ceftriaxone, cefazolin Has patient had a PCN reaction causing immediate rash, facial/tongue/throat swelling, SOB or lightheadedness with hypotension: Unknown Has patient had a PCN reaction causing severe rash involving mucus membranes or skin necrosis: Unknown Has patient had a PCN reaction that required hospitalization: Unknown Has patient had a PCN reaction occurring within the last 10 years: Unknown If all of the above answers are "NO", then may proceed with Cephalosporin u  . Shellfish Allergy Anaphylaxis  . Shellfish-Derived Products Anaphylaxis  . Sulfa Antibiotics Anaphylaxis  . Sulfacetamide Sodium Anaphylaxis  . Sulfasalazine Anaphylaxis  . Morphine And Related Other (See Comments)    Altered mental status   VITALS:  Blood pressure (!) 168/54, pulse 74, temperature 97.8 F (36.6 C), temperature source Oral, resp. rate 18, height 5' (1.524 m), weight 100.3 kg, SpO2 98 %. PHYSICAL EXAMINATION:  Physical Exam  HENT:  Head: Normocephalic and atraumatic.  Eyes: Pupils are equal, round, and reactive to light. Conjunctivae and EOM are normal.  Neck: Normal range of motion. Neck supple. No tracheal deviation present. No thyromegaly present.  Cardiovascular: Normal rate, regular rhythm and normal heart sounds.  Pulmonary/Chest: Effort normal and breath sounds normal. No respiratory distress. She has no wheezes. She exhibits no tenderness.  Abdominal: Soft. Bowel sounds are normal. She exhibits no  distension. There is no tenderness.  Musculoskeletal: Normal range of motion.  Neurological: No cranial nerve deficit.  Skin: Skin is warm and dry. No rash noted.   LABORATORY PANEL:  Female CBC Recent Labs  Lab 06/08/18 0534  WBC 14.2*  HGB 11.6*  HCT 37.8  PLT 142*   ------------------------------------------------------------------------------------------------------------------ Chemistries  Recent Labs  Lab 06/03/18 1122  06/08/18 0534  NA 146*   < > 141  K 5.3*   < > 4.0  CL 97*   < > 97*  CO2 36*   < > 40*  GLUCOSE 340*   < > 199*  BUN 36*   < > 21  CREATININE 1.08*   < > 0.56  CALCIUM 9.5   < > 9.1  AST 16  --   --   ALT 10  --   --   ALKPHOS 72  --   --   BILITOT 1.6*  --   --    < > = values in this interval not displayed.   RADIOLOGY:  Dg Chest 2 View  Result Date: 06/08/2018 CLINICAL DATA:  Dyspnea EXAM: CHEST - 2 VIEW COMPARISON:  06/05/2018 chest radiograph. FINDINGS: Stable cardiomediastinal silhouette with mild cardiomegaly. No pneumothorax. Small bilateral pleural effusions, slightly increased bilaterally. No overt pulmonary edema. Patchy bibasilar consolidation is slightly worsened bilaterally. IMPRESSION: 1. Small bilateral pleural effusions, slightly increased bilaterally. 2. Patchy bibasilar lung consolidation, slightly worsened bilaterally, which could represent atelectasis, aspiration and/or pneumonia. 3. Stable mild cardiomegaly without overt pulmonary edema. Electronically Signed   By: Ilona Sorrel M.D.   On: 06/08/2018 07:49   ASSESSMENT AND PLAN:   *Acute on chronic respiratory failure  with hypoxia and hypercapnia: Due to UTI and pneumonia, continue IV antibiotics    * Sepsis: present on admission due to pneumonia and UTI continue IV meropenem  * ESBL UTI - meropenem  * Pneumonia - Meropenem -Repeat chest x-ray shows some worsening  * Acute metabolic encephalopathy - CT head neg This is related to hypercarbia I will start patient on  Provigil BiPAP most at nighttime  * ARF - resolved with hydration  * elevated troponins - due to supply demand ischemia  * Chronic diastolic congestive heart failure Currently no exacerbation symptoms, will monitor.  * Hypernatremia: Sodium now normal  *Diabetes type 2 continue sliding-scale insulin and Lantus   All the records are reviewed and case discussed with Care Management/Social Worker. Management plans discussed with the patient, nursing and they are in agreement.  CODE STATUS: Full Code  TOTAL TIME TAKING CARE OF THIS PATIENT: 35 minutes   More than 50% of the time was spent in counseling/coordination of care: YES  POSSIBLE D/C IN 2-3 DAYS, DEPENDING ON CLINICAL CONDITION.   Dustin Flock M.D on 06/08/2018 at 11:23 AM  Between 7am to 6pm - Pager - [phone]  After 6pm go to [email or web address] - Technical brewer [facility]  Office  [phone]  CC: Primary care physician; [name], MD  Note: This dictation was prepared with Dragon dictation along with smaller phrase technology. Any transcriptional errors that result from this process are unintentional.

## 2018-06-09 ENCOUNTER — Ambulatory Visit: Payer: Medicare HMO | Admitting: Family

## 2018-06-09 DIAGNOSIS — J189 Pneumonia, unspecified organism: Secondary | ICD-10-CM

## 2018-06-09 DIAGNOSIS — N390 Urinary tract infection, site not specified: Secondary | ICD-10-CM

## 2018-06-09 DIAGNOSIS — Z7189 Other specified counseling: Secondary | ICD-10-CM

## 2018-06-09 DIAGNOSIS — Z515 Encounter for palliative care: Secondary | ICD-10-CM

## 2018-06-09 DIAGNOSIS — R4182 Altered mental status, unspecified: Secondary | ICD-10-CM

## 2018-06-09 DIAGNOSIS — R319 Hematuria, unspecified: Secondary | ICD-10-CM

## 2018-06-09 DIAGNOSIS — J441 Chronic obstructive pulmonary disease with (acute) exacerbation: Secondary | ICD-10-CM

## 2018-06-09 DIAGNOSIS — I5020 Unspecified systolic (congestive) heart failure: Secondary | ICD-10-CM

## 2018-06-09 LAB — BASIC METABOLIC PANEL
Anion gap: 8 (ref 5–15)
BUN: 13 mg/dL (ref 8–23)
CO2: 37 mmol/L — ABNORMAL HIGH (ref 22–32)
Calcium: 9.3 mg/dL (ref 8.9–10.3)
Chloride: 99 mmol/L (ref 98–111)
Creatinine, Ser: 0.57 mg/dL (ref 0.44–1.00)
GFR calc Af Amer: >60 mL/min (ref >60)
Glucose, Bld: 191 mg/dL — ABNORMAL HIGH (ref 70–99)
POTASSIUM: 4.4 mmol/L (ref 3.5–5.1)
SODIUM: 144 mmol/L (ref 135–145)

## 2018-06-09 LAB — CBC
HCT: 37.5 % (ref 36.0–46.0)
HEMOGLOBIN: 11.4 g/dL — AB (ref 12.0–15.0)
MCH: 26.2 pg (ref 26.0–34.0)
MCHC: 30.4 g/dL (ref 30.0–36.0)
MCV: 86.2 fL (ref 80.0–100.0)
Platelets: 178 10*3/uL (ref 150–400)
RBC: 4.35 MIL/uL (ref 3.87–5.11)
RDW: 15 % (ref 11.5–15.5)
WBC: 14.7 10*3/uL — AB (ref 4.0–10.5)
nRBC: 0 % (ref 0.0–0.2)

## 2018-06-09 LAB — CULTURE, BLOOD (ROUTINE X 2): SPECIAL REQUESTS: ADEQUATE

## 2018-06-09 LAB — GLUCOSE, CAPILLARY
GLUCOSE-CAPILLARY: 198 mg/dL — AB (ref 70–99)
GLUCOSE-CAPILLARY: 257 mg/dL — AB (ref 70–99)
Glucose-Capillary: 185 mg/dL — ABNORMAL HIGH (ref 70–99)
Glucose-Capillary: 261 mg/dL — ABNORMAL HIGH (ref 70–99)

## 2018-06-09 NOTE — Progress Notes (Signed)
Advanced care plan.  Purpose of the Encounter: CODE STATUS  Parties in Attendance: Patient spouse and patient  Patient's Decision Capacity: Not intact  Subjective/Patient's story:  Patient 77 year old who has morbid obesity and likely severe sleep apnea, with acute respiratory failure requiring BiPAP very slow to improve at high risk of readmission  Objective/Medical story  I discussed with the spouse again regarding overall poor prognosis.  Presented option for DNR status with as well as home hospice.  Goals of care determination:  He states that she is she will do better when she gets home.  Wants her to be a full code does not want hospice following her  CODE STATUS: Full code no hospice at home   Time spent discussing advanced care planning: 16 minutes

## 2018-06-09 NOTE — Plan of Care (Signed)

## 2018-06-09 NOTE — Progress Notes (Signed)
[place] at [place] NAME: [name]    MR#:  [id number]  DATE OF BIRTH:  [birth year]  SUBJECTIVE:  CHIEF COMPLAINT:   Chief Complaint  Patient presents with  . Respiratory Distress   More awake today but confused that is her baseline husband at bedside   REVIEW OF SYSTEMS:  Review of Systems  Unable to perform ROS: Acuity of condition   DRUG ALLERGIES:   Allergies  Allergen Reactions  . Iodine Anaphylaxis  . Penicillins Anaphylaxis    Tolerates ceftriaxone, cefazolin Has patient had a PCN reaction causing immediate rash, facial/tongue/throat swelling, SOB or lightheadedness with hypotension: Unknown Has patient had a PCN reaction causing severe rash involving mucus membranes or skin necrosis: Unknown Has patient had a PCN reaction that required hospitalization: Unknown Has patient had a PCN reaction occurring within the last 10 years: Unknown If all of the above answers are "NO", then may proceed with Cephalosporin u  . Shellfish Allergy Anaphylaxis  . Shellfish-Derived Products Anaphylaxis  . Sulfa Antibiotics Anaphylaxis  . Sulfacetamide Sodium Anaphylaxis  . Sulfasalazine Anaphylaxis  . Morphine And Related Other (See Comments)    Altered mental status   VITALS:  Blood pressure (!) 162/47, pulse 88, temperature 98.2 F (36.8 C), temperature source Oral, resp. rate 18, height 5' (1.524 m), weight 100.3 kg, SpO2 94 %. PHYSICAL EXAMINATION:  Physical Exam  HENT:  Head: Normocephalic and atraumatic.  Eyes: Pupils are equal, round, and reactive to light. Conjunctivae and EOM are normal.  Neck: Normal range of motion. Neck supple. No tracheal deviation present. No thyromegaly present.  Cardiovascular: Normal rate, regular rhythm and normal heart sounds.  Pulmonary/Chest: Effort normal and breath sounds normal. No respiratory distress. She has no wheezes. She exhibits no tenderness.  Abdominal: Soft. Bowel sounds are normal.  She exhibits no distension. There is no tenderness.  Musculoskeletal: Normal range of motion.  Neurological: No cranial nerve deficit.  Skin: Skin is warm and dry. No rash noted.   LABORATORY PANEL:  Female CBC Recent Labs  Lab 06/09/18 0610  WBC 14.7*  HGB 11.4*  HCT 37.5  PLT 178   ------------------------------------------------------------------------------------------------------------------ Chemistries  Recent Labs  Lab 06/03/18 1122  06/09/18 0610  NA 146*   < > 144  K 5.3*   < > 4.4  CL 97*   < > 99  CO2 36*   < > 37*  GLUCOSE 340*   < > 191*  BUN 36*   < > 13  CREATININE 1.08*   < > 0.57  CALCIUM 9.5   < > 9.3  AST 16  --   --   ALT 10  --   --   ALKPHOS 72  --   --   BILITOT 1.6*  --   --    < > = values in this interval not displayed.   RADIOLOGY:  No results found. ASSESSMENT AND PLAN:   *Acute on chronic respiratory failure with hypoxia and hypercapnia: Due to UTI and pneumonia,  Continue meropenem IV 1 more day  * Sepsis: present on admission due to pneumonia and UTI continue IV meropenem  * ESBL UTI - meropenem  * Pneumonia - Meropenem -Repeat chest x-ray shows some worsening this is related to atelectasis  * Acute metabolic encephalopathy - CT head neg This is related to hypercarbia continue Provigil BiPAP most at nighttime Patient will need trilogy at discharge  * ARF - resolved with hydration  *  elevated troponins - due to supply demand ischemia  * Chronic diastolic congestive heart failure Currently no exacerbation symptoms, will monitor.  * Hypernatremia: Sodium now normal  *Diabetes type 2 continue sliding-scale insulin and Lantus    Husband at bedside explained him prognosis very poor he wants to take her home probable discharge tomorrow   All the records are reviewed and case discussed with Care Management/Social Worker. Management plans discussed with the patient, nursing and they are in agreement.  CODE STATUS:  Full Code  TOTAL TIME TAKING CARE OF THIS PATIENT: 35 minutes   More than 50% of the time was spent in counseling/coordination of care: YES  POSSIBLE D/C IN 2-3 DAYS, DEPENDING ON CLINICAL CONDITION.   Dustin Flock M.D on 06/09/2018 at 11:54 AM  Between 7am to 6pm - Pager - [phone]  After 6pm go to [email or web address] - Technical brewer [facility]  Office  [phone]  CC: Primary care physician; [name], MD  Note: This dictation was prepared with Dragon dictation along with smaller phrase technology. Any transcriptional errors that result from this process are unintentional.

## 2018-06-09 NOTE — Clinical Social Work Note (Signed)
CSW was consulted for SNF placement. Patient's husband states that he will be taking patient home and does not want to send her to SNF at this time. CSW signing off. Please re consult if further needs arise.   [place], [place]

## 2018-06-09 NOTE — Plan of Care (Signed)
  Problem: Education: Goal: Knowledge of General Education information will improve Description Including pain rating scale, medication(s)/side effects and non-pharmacologic comfort measures 06/09/2018 1459 by Daylene Posey, RN Outcome: Progressing 06/09/2018 1458 by Daylene Posey, RN Outcome: Progressing   Problem: Health Behavior/Discharge Planning: Goal: Ability to manage health-related needs will improve 06/09/2018 1459 by Daylene Posey, RN Outcome: Progressing 06/09/2018 1458 by Daylene Posey, RN Outcome: Progressing   Problem: Clinical Measurements: Goal: Ability to maintain clinical measurements within normal limits will improve 06/09/2018 1459 by Daylene Posey, RN Outcome: Progressing 06/09/2018 1458 by Daylene Posey, RN Outcome: Progressing Goal: Will remain free from infection 06/09/2018 1459 by Daylene Posey, RN Outcome: Progressing 06/09/2018 1458 by Daylene Posey, RN Outcome: Progressing Goal: Diagnostic test results will improve 06/09/2018 1459 by Daylene Posey, RN Outcome: Progressing 06/09/2018 1458 by Daylene Posey, RN Outcome: Progressing Goal: Respiratory complications will improve 06/09/2018 1459 by Daylene Posey, RN Outcome: Progressing 06/09/2018 1458 by Daylene Posey, RN Outcome: Progressing Goal: Cardiovascular complication will be avoided 06/09/2018 1459 by Daylene Posey, RN Outcome: Progressing 06/09/2018 1458 by Daylene Posey, RN Outcome: Progressing   Problem: Activity: Goal: Risk for activity intolerance will decrease 06/09/2018 1459 by Daylene Posey, RN Outcome: Progressing 06/09/2018 1458 by Daylene Posey, RN Outcome: Progressing   Problem: Nutrition: Goal: Adequate nutrition will be maintained 06/09/2018 1459 by Daylene Posey, RN Outcome: Progressing 06/09/2018 1458 by Daylene Posey, RN Outcome: Progressing   Problem: Coping: Goal: Level of anxiety will decrease 06/09/2018 1459 by Daylene Posey, RN Outcome: Progressing 06/09/2018 1458 by Daylene Posey, RN Outcome: Progressing   Problem: Elimination: Goal: Will not experience complications related to bowel motility 06/09/2018 1459 by Daylene Posey, RN Outcome: Progressing 06/09/2018 1458 by Daylene Posey, RN Outcome: Progressing Goal: Will not experience complications related to urinary retention 06/09/2018 1459 by Daylene Posey, RN Outcome: Progressing 06/09/2018 1458 by Daylene Posey, RN Outcome: Progressing   Problem: Pain Managment: Goal: General experience of comfort will improve 06/09/2018 1459 by Daylene Posey, RN Outcome: Progressing 06/09/2018 1458 by Daylene Posey, RN Outcome: Progressing   Problem: Safety: Goal: Ability to remain free from injury will improve 06/09/2018 1459 by Daylene Posey, RN Outcome: Progressing 06/09/2018 1458 by Daylene Posey, RN Outcome: Progressing   Problem: Skin Integrity: Goal: Risk for impaired skin integrity will decrease 06/09/2018 1459 by Daylene Posey, RN Outcome: Progressing 06/09/2018 1458 by Daylene Posey, RN Outcome: Progressing   Problem: Fluid Volume: Goal: Hemodynamic stability will improve 06/09/2018 1459 by Daylene Posey, RN Outcome: Progressing 06/09/2018 1458 by Daylene Posey, RN Outcome: Progressing   Problem: Clinical Measurements: Goal: Diagnostic test results will improve 06/09/2018 1459 by Daylene Posey, RN Outcome: Progressing 06/09/2018 1458 by Daylene Posey, RN Outcome: Progressing Goal: Signs and symptoms of infection will decrease 06/09/2018 1459 by Daylene Posey, RN Outcome: Progressing 06/09/2018 1458 by Daylene Posey, RN Outcome: Progressing   Problem: Respiratory: Goal: Ability to maintain adequate ventilation will improve 06/09/2018 1459 by Daylene Posey, RN Outcome: Progressing 06/09/2018 1458 by Daylene Posey, RN Outcome: Progressing

## 2018-06-09 NOTE — Progress Notes (Signed)
Pt. Flatly refuses to wear BIPAP in spite of constant coaching and encouragement. Pt. Placed back on O2 via 2L Nasal cannula.

## 2018-06-09 NOTE — Care Management (Signed)
Spoke with [name] at the bedside. States he wants to take his wife home. States he has support coming to the home. Would like [facility] to come to the home. Also, will pay co-pay for Trilogy. Unsure about transportation home. Discharge tomorrow per Dr. Reginia Forts RN MSN CCM Care Management [phone]

## 2018-06-09 NOTE — NC FL2 (Signed)
[place] LEVEL OF CARE SCREENING TOOL     IDENTIFICATION  Patient Name: [name] Birthdate: 07/20/1941 Sex: female Admission Date (Current Location): 06/03/2018  [place] and [place] Number:  Engineering geologist and Address:  [facility], [address] [place], [place] [address]      Provider Number: [id number]  Attending Physician Name and Address:  Dustin Flock, MD  Relative Name and Phone Number:  Selen Smucker- daughter [phone]    Current Level of Care: Hospital Recommended Level of Care: [place] Prior Approval Number:    Date Approved/Denied:   PASRR Number: [id number]  Discharge Plan: SNF    Current Diagnoses: Patient Active Problem List   Diagnosis Date Noted  . Healthcare-associated pneumonia 06/03/2018  . Acute cystitis 04/10/2018  . Sepsis due to Streptococcus, group B ([place])   . Multiple open wounds of lower leg   . PVD (peripheral vascular disease) ([place])   . Bacteremia 08/31/2016  . Sepsis ([place]) 08/29/2016  . Benign essential HTN   . Acute on chronic diastolic heart failure ([place])   . Ulcer of right lower extremity ([place])   . Chest pain 07/05/2016  . Impacted cerumen of both ears 06/05/2016  . 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 Pleural effusion, bilateral   . Wheeze   . Dyspnea   . CHF (congestive heart failure) ([place])   . Right sided weakness   . Acute respiratory acidosis   . Surgery, elective   . Anemia, iron deficiency   . Leukocytosis   . Cerebrovascular accident (CVA) due to thrombosis of precerebral artery ([place]) 03/01/2016  . 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 PAD (peripheral artery disease) ([place]) 02/28/2016  . Atherosclerotic peripheral vascular disease with intermittent claudication ([place]) 12/08/2015  . Open toe wound 05/04/2014  . Anemia, chronic disease 04/27/2014  . Occult blood positive stool  04/17/2014  . Edema 04/03/2014  . Unspecified hereditary and idiopathic peripheral neuropathy 03/14/2014  . DM2 (diabetes mellitus, type 2) ([place]) 02/14/2014  . Altered mental status 02/12/2014  . Acute encephalopathy 02/12/2014  . Aortic stenosis 02/04/2014  . Closed left subtrochanteric femur fracture ([place]) 02/03/2014  . Hip fracture, left ([place]) 02/03/2014  . Hypokalemia 02/03/2014  . Fibula fracture 02/03/2014  . MTP instability 02/03/2014  . Hip fracture ([place]) 02/03/2014  . HTN (hypertension)   . HLD (hyperlipidemia)     Orientation RESPIRATION BLADDER Height & Weight     Self, Time, Place  O2(2 liters ) Incontinent Weight: 221 lb 1.9 oz (100.3 kg) Height:  5' (152.4 cm)  BEHAVIORAL SYMPTOMS/MOOD NEUROLOGICAL BOWEL NUTRITION STATUS  (none) (none) Incontinent Diet(dysphagia 2)  AMBULATORY STATUS COMMUNICATION OF NEEDS Skin   Extensive Assist Verbally Other (Comment)(diabetic foot ulcer)                       Personal Care Assistance Level of Assistance  Bathing, Feeding, Dressing Bathing Assistance: Limited assistance Feeding assistance: Independent Dressing Assistance: Limited assistance     Functional Limitations Info  Sight, Hearing, Speech Sight Info: Adequate Hearing Info: Adequate Speech Info: Adequate    SPECIAL CARE FACTORS FREQUENCY  PT (By licensed PT), OT (By licensed OT)     PT Frequency: 5 OT Frequency: 5            Contractures Contractures Info: Not present    Additional Factors Info  Code Status, Allergies Code Status Info: Full Code  Allergies Info:  Iodine, Penicillins, Shellfish Allergy, Shellfish-derived Products, Sulfa Antibiotics, Sulfacetamide Sodium, Sulfasalazine, Morphine And Related           Current Medications (06/09/2018):  This is the current hospital active medication list Current Facility-Administered Medications  Medication Dose Route Frequency Provider Last Rate Last Dose  . 0.9 %  sodium chloride infusion    Intravenous PRN Cassandria Santee, MD 5 mL/hr at 06/08/18 0414 500 mL at 06/08/18 0414  . acetaminophen (TYLENOL) tablet 1,000 mg  1,000 mg Oral Q6H PRN Vaughan Basta, MD   1,000 mg at 06/08/18 0357  . aspirin tablet 325 mg  325 mg Oral Daily Vaughan Basta, MD   325 mg at 06/08/18 0829  . atorvastatin (LIPITOR) tablet 20 mg  20 mg Oral Daily Vaughan Basta, MD   20 mg at 06/08/18 0828  . budesonide (PULMICORT) nebulizer solution 0.25 mg  0.25 mg Nebulization BID Awilda Bill, NP   0.25 mg at 06/09/18 0744  . carvedilol (COREG) tablet 6.25 mg  6.25 mg Oral BID Vaughan Basta, MD   6.25 mg at 06/08/18 2040  . cholecalciferol (VITAMIN D) tablet 2,000 Units  2,000 Units Oral Daily Vaughan Basta, MD   2,000 Units at 06/08/18 [phone]  . clopidogrel (PLAVIX) tablet 75 mg  75 mg Oral Q breakfast Vaughan Basta, MD   75 mg at 06/08/18 0828  . docusate sodium (COLACE) capsule 100 mg  100 mg Oral BID Vaughan Basta, MD   100 mg at 06/08/18 2040  . docusate sodium (COLACE) capsule 100 mg  100 mg Oral BID PRN Vaughan Basta, MD      . fluticasone (FLONASE) 50 MCG/ACT nasal spray 1 spray  1 spray Each Nare QPM Vaughan Basta, MD   1 spray at 06/08/18 1712  . heparin injection 5,000 Units  5,000 Units Subcutaneous Q8H Vaughan Basta, MD   5,000 Units at 06/09/18 [phone]  . hydrALAZINE (APRESOLINE) injection 10-20 mg  10-20 mg Intravenous Q6H PRN Darel Hong D, NP   20 mg at 06/04/18 0730  . insulin aspart (novoLOG) injection 0-15 Units  0-15 Units Subcutaneous TID WC Dustin Flock, MD   3 Units at 06/09/18 0816  . insulin aspart (novoLOG) injection 0-5 Units  0-5 Units Subcutaneous QHS Dustin Flock, MD   3 Units at 06/08/18 2041  . insulin aspart (novoLOG) injection 3 Units  3 Units Subcutaneous TID WC Dustin Flock, MD   3 Units at 06/09/18 0816  . insulin glargine (LANTUS) injection 40 Units  40 Units Subcutaneous QHS Dustin Flock, MD   40 Units at 06/08/18 2040  . ipratropium-albuterol (DUONEB) 0.5-2.5 (3) MG/3ML nebulizer solution 3 mL  3 mL Nebulization TID Max Sane, MD   3 mL at 06/09/18 0744  . ipratropium-albuterol (DUONEB) 0.5-2.5 (3) MG/3ML nebulizer solution 3 mL  3 mL Nebulization Q4H PRN Max Sane, MD      . isosorbide dinitrate (ISORDIL) tablet 30 mg  30 mg Oral Daily Vaughan Basta, MD   30 mg at 06/08/18 0829  . MEDLINE mouth rinse  15 mL Mouth Rinse BID Cassandria Santee, MD   15 mL at 06/08/18 2045  . meropenem (MERREM) 1 g in sodium chloride 0.9 % 100 mL IVPB  1 g Intravenous Q8H Awilda Bill, NP   Stopped at 06/09/18 [phone]  . modafinil (PROVIGIL) tablet 100 mg  100 mg Oral Daily Dustin Flock, MD   100 mg at 06/08/18 1239  . nitroGLYCERIN (NITROSTAT) SL tablet 0.4 mg  0.4 mg Sublingual Q5 min PRN Vaughan Basta, MD      . pantoprazole (PROTONIX) EC tablet 40 mg  40 mg Oral Daily Vaughan Basta, MD   40 mg at 06/08/18 0828  . polyethylene glycol (MIRALAX / GLYCOLAX) packet 17 g  17 g Oral BID Vaughan Basta, MD   17 g at 06/08/18 0827  . pregabalin (LYRICA) capsule 50 mg  50 mg Oral BID Vaughan Basta, MD   50 mg at 06/08/18 2040     Discharge Medications: Please see discharge summary for a list of discharge medications.  Relevant Imaging Results:  Relevant Lab Results:   Additional Information SSN: [id number]  Annamaria Boots, [place]

## 2018-06-09 NOTE — Progress Notes (Signed)
Pharmacy Antibiotic Note  RAEDYN KLINCK is a 77 y.o. female admitted on 06/03/2018 with sepsis secondary to pneumonia and UTI. Of note, patient was hospitalized last month for UTI (9/6 UCx grew E. Coli, pansensitive; treated with CTX x7 days) and was sent to [facility] since then. Pharmacy has been consulted for meropenem dosing. Both her blood and urine have grown out ESBL E coli. This is day number 5 of IV meropenem. Her renal function has been stable  Plan: Continue meropenem 1 g IV q8h  Height: 5' (152.4 cm) Weight: 221 lb 1.9 oz (100.3 kg) IBW/kg (Calculated) : 45.5  Temp (24hrs), Avg:98.1 F (36.7 C), Min:97.7 F (36.5 C), Max:98.4 F (36.9 C)  Recent Labs  Lab 06/03/18 1122 06/03/18 1651 06/04/18 0116 06/05/18 0531 06/06/18 1050 06/07/18 0336 06/08/18 0534 06/09/18 0610  WBC 16.8*  --  14.2* 9.5  --  10.8* 14.2* 14.7*  CREATININE 1.08*  --  0.89 0.87 0.72 0.60 0.56 0.57  LATICACIDVEN 1.9 2.0*  --   --   --   --   --   --     Estimated Creatinine Clearance: 64.7 mL/min (by C-G formula based on SCr of 0.57 mg/dL).    Allergies  Allergen Reactions  . Iodine Anaphylaxis  . Penicillins Anaphylaxis    Tolerates ceftriaxone, cefazolin Has patient had a PCN reaction causing immediate rash, facial/tongue/throat swelling, SOB or lightheadedness with hypotension: Unknown Has patient had a PCN reaction causing severe rash involving mucus membranes or skin necrosis: Unknown Has patient had a PCN reaction that required hospitalization: Unknown Has patient had a PCN reaction occurring within the last 10 years: Unknown If all of the above answers are "NO", then may proceed with Cephalosporin u  . Shellfish Allergy Anaphylaxis  . Shellfish-Derived Products Anaphylaxis  . Sulfa Antibiotics Anaphylaxis  . Sulfacetamide Sodium Anaphylaxis  . Sulfasalazine Anaphylaxis  . Morphine And Related Other (See Comments)    Altered mental status    Antimicrobials this  admission: Levofloxacin 750 mg IV x 1 in ED Vancomycin 1000 mg x 1, 1250 mg x 1   Cefepime 10/30 >> 11/1 Meropenem 11/1 >>   Microbiology results: 10/30 BCx: E coli (ESBL resistant; sensitive to imipenem)    10/30 UCx: >=100,000 E.coli (ESBL resistant; sensitive to imipenem)      10/30 MRSA PCR: negative   Thank you for allowing pharmacy to be a part of this patient's care.  Dallie Piles, Pharm.D 06/09/2018 7:52 AM

## 2018-06-09 NOTE — Consult Note (Signed)
Consultation Note Date: 06/09/2018   Patient Name: [name]  DOB: [birth year]  MRN: [id number]  Age / Sex: 77 y.o., female  PCP: [name] Richards, MD Referring Physician: Dustin Flock, MD  Reason for Consultation: Establishing goals of care  HPI/Patient Profile: 77 y.o. female admitted on 06/03/2018 from the wound care clinic with complaints of drowsiness and hypoxia. Patient is currently a resident at [facility] for rehabilitation. She has a past medical history of diabetes, diabetic neuropathy, PAD, hypertension, aortic stenosis, CHF, hyperlipidemia, and GERD. Family reported that patient has not ambulated in the past month while at the facility. During her ED course UA positive for UTI, CT chest showed infiltrates, PCO2 89, PO2 112, Bicarb 51.5, Sodium 151, Glucose 271, Potassium 5.3, BUN 36, Creatinine 1.08, Albumin 3.0 , total bilirubin 1.6, BNP 625, troponin 0.65, Lactic acid 1.9, WBC 16.8. Since admission she remains confused, continues on IV antibiotics for UTI and pneumonia. CT head negative. She continues with BiPAP mostly at bedtime, and continues with electrolyte replacement. Palliative Medicine team consulted for goals of care discussion.   Clinical Assessment and Goals of Care: I have reviewed medical records including lab results, imaging, Epic notes, and MAR, received report from the bedside RN, and assessed the patient. I later spoke with her husband, [name] to discuss diagnosis prognosis, [place], EOL wishes, disposition and options. Husband and I were originally supposed to meet, however staff informed me he was not able to stay earlier.   I introduced Palliative Medicine as specialized medical care for people living with serious illness. It focuses on providing relief from the symptoms and stress of a serious illness. The goal is to improve quality of life for both the patient and the  family.  Husband verbalized the goal is for his wife to return home. He continues to state he and his family are not interested in her returning to SNF. They feel that this is why she is in her current condition because they were not able to be there daily and provide the care she needs. Husband feels as though she was doing somewhat better before she was placed into the facility.   We discussed her current illness and what it means in the larger context of her on-going co-morbidities.  Natural disease trajectory and expectations at EOL were discussed. [name] continues to express that he feels his wife will get better and stronger with time.   He request to continue to treat the treatable with aggressive measures. I attempted to discuss patient's decline in health and her conditions specifically regarding altered mental status, diabetes, and CHF. Husband verbalized he is awareness of her health conditions and again that he is hopeful for improvement.   Patient does not have any documented advance directives. Husband reports he is her decision maker and primary caregiver. I attempted to discuss patient's full code status, in consideration of her current illness and co-morbidities. [name] request his wife remain a full code.   Hospice and Palliative Care services outpatient  were explained and offered. Based on his expressed wishes and goals to continue to treat aggressively. [name] would be most appropriate for outpatient palliative services. Husband verbalizes his wishes and goal for home health located out of [place]. He is hopeful she will be discharged in the next few days.   Questions and concerns were addressed.  The family was encouraged to call with questions or concerns.  PMT will continue to support holistically.  Primary Decision Maker: NEXT OF [name]     SUMMARY OF RECOMMENDATIONS    Full Code-as confirmed by husband  Continue to treat the treatable with  aggressive measures  Husband express he and his children are hopeful patient will be discharged home soon with HHPT. They are not interested in SNF placement.   Outpatient Palliative at minimum recommended.   Code Status/Advance Care Planning:  Full code  Palliative Prophylaxis:   Aspiration, Bowel Regimen, Delirium Protocol, Frequent Pain Assessment, Oral Care, Palliative Wound Care and Turn Reposition  Additional Recommendations (Limitations, Scope, Preferences):  Full Scope Treatment-continue to treat the treatable   Psycho-social/Spiritual:   Desire for further Chaplaincy support:NO   Additional Recommendations: Caregiving  Support/Resources and Education on Hospice  Prognosis:   Unable to determine-Guarded to Poor   Discharge Planning: Home with Home Health and outpatient Palliative      Primary Diagnoses: Present on Admission: . Sepsis ([place]) . Acute cystitis . Altered mental status   I have reviewed the medical record, interviewed the patient and family, and examined the patient. The following aspects are pertinent.  Past Medical History:  Diagnosis Date  . Acute heart failure ([place])   . Aortic stenosis   . CHF (congestive heart failure) ([place])   . Complication of anesthesia    Hard to wake patient up after having anesthesia  . Diabetes mellitus without complication ([place])   . Diabetic neuropathy (H[facility])    Takes Lyrica  . Edema of both legs    Takes Lasix  . GERD (gastroesophageal reflux disease)   . High cholesterol   . HTN (hypertension)   . Hypertension   . PAD (peripheral artery disease) ([place])   . Shortness of breath dyspnea   . Spasm of back muscles   . Stroke ([place])   . Wound, open    Right great toe   Social History   Socioeconomic History  . Marital status: Married    Spouse name: Not on file  . Number of children: Not on file  . Years of education: Not on file  . Highest education level: Not on file  Occupational History  . Not on  file  Social Needs  . Financial resource strain: Not on file  . Food insecurity:    Worry: Not on file    Inability: Not on file  . Transportation needs:    Medical: Not on file    Non-medical: Not on file  Tobacco Use  . Smoking status: Never Smoker  . Smokeless tobacco: Never Used  . Tobacco comment: Never smoked  Substance and Sexual Activity  . Alcohol use: No    Alcohol/week: 0.0 standard drinks  . Drug use: No  . Sexual activity: Never  Lifestyle  . Physical activity:    Days per week: Not on file    Minutes per session: Not on file  . Stress: Not on file  Relationships  . Social connections:    Talks on phone: Not on file    Gets together: Not on file  Attends religious service: Not on file    Active member of club or organization: Not on file    Attends meetings of clubs or organizations: Not on file    Relationship status: Not on file  Other Topics Concern  . Not on file  Social History Narrative  . Not on file   Family History  Problem Relation Age of Onset  . Diabetes Other   . Liver disease Mother   . CVA Father   . Diabetes Father   . Hypertension Father    Scheduled Meds: . aspirin  325 mg Oral Daily  . atorvastatin  20 mg Oral Daily  . budesonide (PULMICORT) nebulizer solution  0.25 mg Nebulization BID  . carvedilol  6.25 mg Oral BID  . cholecalciferol  2,000 Units Oral Daily  . clopidogrel  75 mg Oral Q breakfast  . docusate sodium  100 mg Oral BID  . fluticasone  1 spray Each Nare QPM  . heparin  5,000 Units Subcutaneous Q8H  . insulin aspart  0-15 Units Subcutaneous TID WC  . insulin aspart  0-5 Units Subcutaneous QHS  . insulin aspart  3 Units Subcutaneous TID WC  . insulin glargine  40 Units Subcutaneous QHS  . ipratropium-albuterol  3 mL Nebulization TID  . isosorbide dinitrate  30 mg Oral Daily  . mouth rinse  15 mL Mouth Rinse BID  . modafinil  100 mg Oral Daily  . pantoprazole  40 mg Oral Daily  . polyethylene glycol  17 g Oral  BID  . pregabalin  50 mg Oral BID   Continuous Infusions: . sodium chloride 500 mL (06/08/18 0414)  . meropenem (MERREM) IV Stopped (06/09/18 0412)   PRN Meds:.sodium chloride, acetaminophen, docusate sodium, hydrALAZINE, ipratropium-albuterol, nitroGLYCERIN Medications Prior to Admission:  Prior to Admission medications   Medication Sig Start Date End Date Taking? Authorizing Provider  acetaminophen (TYLENOL) 500 MG tablet Take 1,000 mg by mouth every 6 (six) hours as needed for mild pain.   Yes [provider]  albuterol (PROVENTIL HFA;VENTOLIN HFA) 108 (90 Base) MCG/ACT inhaler Inhale 2 puffs into the lungs every 6 (six) hours as needed for wheezing or shortness of breath.    Yes [provider]  aspirin 325 MG tablet Take 1 tablet (325 mg total) by mouth daily with breakfast. Patient taking differently: Take 325 mg by mouth daily.  07/10/16  Yes Holley Raring, MD  atorvastatin (LIPITOR) 20 MG tablet Take 1 tablet (20 mg total) by mouth daily. 03/13/16  Yes Virgina Jock A, PA-C  carvedilol (COREG) 6.25 MG tablet Take 6.25 mg by mouth 2 (two) times daily.    Yes [provider]  cetirizine (ZYRTEC) 10 MG tablet Take 10 mg by mouth daily.    Yes [provider]  cholecalciferol (VITAMIN D) 1000 units tablet Take 2,000 Units by mouth daily.    Yes [provider]  clopidogrel (PLAVIX) 75 MG tablet Take 1 tablet (75 mg total) by mouth daily with breakfast. 03/04/17  Yes Conrad Godley, MD  docusate sodium (COLACE) 100 MG capsule Take 100 mg by mouth 2 (two) times daily.   Yes [provider]  fluticasone (FLONASE) 50 MCG/ACT nasal spray Place 1 spray into both nostrils every evening.    Yes [provider]  insulin aspart (NOVOLOG) 100 UNIT/ML injection Inject 0-15 Units into the skin 3 (three) times daily with meals. Before each meal 3 times a day, 140-199 - 3 units, 200-250 - 5 units,  251-299 - 8 units,  300-349 - 12 units,  350 or  above 15 units. Patient taking differently: Inject 14 Units into the skin 3 (three) times daily with meals.  02/09/14  Yes Thurnell Lose, MD  insulin glargine (LANTUS) 100 UNIT/ML injection Inject 0.27 mLs (27 Units total) into the skin at bedtime. Patient taking differently: Inject 40 Units into the skin at bedtime.  09/16/16  Yes Reyne Dumas, MD  isosorbide dinitrate (ISORDIL) 30 MG tablet Take 30 mg by mouth daily.  04/16/17  Yes [provider]  nitroGLYCERIN (NITROSTAT) 0.4 MG SL tablet Place 1 tablet (0.4 mg total) under the tongue every 5 (five) minutes as needed for chest pain. 07/09/16  Yes Holley Raring, MD  nystatin ointment (MYCOSTATIN) Apply 1 application topically 3 (three) times daily. Apply to area below breasts   Yes [provider]  omeprazole (PRILOSEC) 20 MG capsule Take 1 capsule (20 mg total) by mouth daily. 04/17/18  Yes Sainani, Belia Heman, MD  polyethylene glycol (MIRALAX / GLYCOLAX) packet Take 17 g by mouth 2 (two) times daily. 02/09/14  Yes Thurnell Lose, MD  potassium chloride SA (K-DUR,KLOR-CON) 20 MEQ tablet Take 20 mEq by mouth daily.   Yes [provider]  pregabalin (LYRICA) 50 MG capsule Take 50 mg by mouth 2 (two) times daily.    Yes [provider]  torsemide (DEMADEX) 20 MG tablet Take 2 tablets (40 mg total) by mouth 2 (two) times daily. 04/17/18  Yes Sainani, Belia Heman, MD  VITAMIN E PO Take 1 capsule by mouth daily.   Yes [provider]   Allergies  Allergen Reactions  . Iodine Anaphylaxis  . Penicillins Anaphylaxis    Tolerates ceftriaxone, cefazolin Has patient had a PCN reaction causing immediate rash, facial/tongue/throat swelling, SOB or lightheadedness with hypotension: Unknown Has patient had a PCN reaction causing severe rash involving mucus membranes or skin necrosis: Unknown Has patient had a PCN reaction that required hospitalization: Unknown Has patient had a PCN reaction occurring within the last 10  years: Unknown If all of the above answers are "NO", then may proceed with Cephalosporin u  . Shellfish Allergy Anaphylaxis  . Shellfish-Derived Products Anaphylaxis  . Sulfa Antibiotics Anaphylaxis  . Sulfacetamide Sodium Anaphylaxis  . Sulfasalazine Anaphylaxis  . Morphine And Related Other (See Comments)    Altered mental status   Review of Systems  Unable to perform ROS: Mental status change    Physical Exam  Constitutional: Vital signs are normal.  Cardiovascular: Normal rate, regular rhythm, normal heart sounds and normal pulses.  Pulmonary/Chest: Effort normal. She has decreased breath sounds.  Abdominal: Soft. Normal appearance and bowel sounds are normal.  Neurological: She is alert. She is disoriented.  Baseline   Skin: Skin is warm and dry. Bruising noted.  Psychiatric: Cognition and memory are impaired. She expresses inappropriate judgment.  Nursing note and vitals reviewed.   Vital Signs: BP (!) 162/47 (BP Location: Right Arm)   Pulse 88   Temp 98.2 F (36.8 C) (Oral)   Resp 18   Ht 5' (1.524 m)   Wt 100.3 kg   SpO2 94%   BMI 43.18 kg/m  Pain Scale: 0-10   Pain Score: 0-No pain   SpO2: SpO2: 94 % O2 Device:SpO2: 94 % O2 Flow Rate: .O2 Flow Rate (L/min): 2 L/min  IO: Intake/output summary:   Intake/Output Summary (Last 24 hours) at 06/09/2018 1052 Last data filed at 06/08/2018 1626 Gross per 24 hour  Intake -  Output 700 ml  Net -700 ml    LBM: Last BM Date: 06/09/18 Baseline Weight: Weight: 108.4 kg Most recent weight: Weight: 100.3 kg     Palliative Assessment/Data: PPS 30%   Time In: 1015 Time Out: 1100 Time Total: 45 min.   Greater than 50%  of this time was spent counseling and coordinating care related to the above assessment and plan.  Signed by: Alda Lea, AGPCNP-BC Palliative Medicine Team  Phone: [phone] Fax: [phone] Pager: [phone] Amion: Bjorn Pippin    Please contact Palliative Medicine Team  phone at [phone] for questions and concerns.  For individual provider: See Shea Evans

## 2018-06-10 LAB — GLUCOSE, CAPILLARY
GLUCOSE-CAPILLARY: 122 mg/dL — AB (ref 70–99)
GLUCOSE-CAPILLARY: 168 mg/dL — AB (ref 70–99)

## 2018-06-10 MED ORDER — NITROFURANTOIN MONOHYD MACRO 100 MG PO CAPS: 100.0000 mg | ORAL_CAPSULE | Freq: Two times a day (BID) | ORAL | 0 refills | Status: AC

## 2018-06-10 MED ORDER — BUDESONIDE 0.25 MG/2ML IN SUSP: 0.2500 mg | Freq: Two times a day (BID) | RESPIRATORY_TRACT | 12 refills | Status: DC

## 2018-06-10 MED ORDER — IPRATROPIUM-ALBUTEROL 0.5-2.5 (3) MG/3ML IN SOLN: 3.0000 mL | Freq: Two times a day (BID) | RESPIRATORY_TRACT | Status: DC

## 2018-06-10 MED ORDER — MODAFINIL 100 MG PO TABS: 200.0000 mg | ORAL_TABLET | Freq: Every day | ORAL | 0 refills | Status: DC

## 2018-06-10 MED ORDER — IPRATROPIUM-ALBUTEROL 0.5-2.5 (3) MG/3ML IN SOLN: 3.0000 mL | Freq: Two times a day (BID) | RESPIRATORY_TRACT | 3 refills | Status: DC

## 2018-06-10 NOTE — Progress Notes (Signed)
IS instructions given for atelectasis. Pt has difficulty understanding instructions. Pt inspire 322ml+-. Pt needs continued encouragement to perform inspiration slower.

## 2018-06-10 NOTE — Progress Notes (Signed)
NEED FOR NIV  1. Severe copd with chronic respiratory failure  Pt continues to exhibit signs of hypercapnia associated with chronic respiratory failure secondary to severe COPD.  Patient requires the use of NIV both nightly and daytime to help with exasperation.  The use of the NIV we will treat patient has high PCO2 level and reduce risk of exacerbations and hospitalizations when he is at night and during the day.  Patient will need these advanced settings in conjunction with her current medication regimen; BiPAP is not an option due to his functional limitations in severity of the patient's condition.  Failure to have NIV available for use over 24 hours.  Could lead to death

## 2018-06-10 NOTE — Progress Notes (Signed)
SATURATION QUALIFICATIONS: (This note is used to comply with regulatory documentation for home oxygen)  Patient Saturations on Room Air at Rest = 86% Patient Saturations on Room Air while Ambulating =   Patient Saturations onLiters of oxygen while Ambulating   Please briefly explain why patient needs home oxygen: 02 sat 86% on room air at rest. Pt unable to ambulate. 02 sat 95% on 2L 02.

## 2018-06-10 NOTE — Progress Notes (Signed)
Discharge instructions given to husband. Prescription given to husband. Husband verbalizes understanding. Pt discharged home via EMS. Handoff report given to EMS.

## 2018-06-10 NOTE — Care Management (Signed)
Discharge to home today per Dr. Posey Pronto. Qualifies for home oxygen. Will be getting home oxygen, trilogy, and home services per [place]. Husband would like transportation to be arranged per [facility] Shelbie Ammons RN MSN [employer] [phone]

## 2018-06-10 NOTE — Discharge Summary (Signed)
[facility] [employer] [facility] [place] at [facility], 77 y.o., DOB April 17, 1941, MRN [id number]. Admission date: 06/03/2018 Discharge Date 06/10/2018 Primary MD [name], MD Admitting Physician Vaughan Basta, MD  Admission Diagnosis  Hypoxia [R09.02] COPD exacerbation ([place]) [J44.1] Urinary tract infection with hematuria, site unspecified [N39.0, R31.9] Altered mental status, unspecified altered mental status type [R41.82] Community acquired pneumonia, unspecified laterality [N39.7] Systolic congestive heart failure, unspecified HF chronicity ([place]) [I50.20]  Discharge Diagnosis   Active Problems: Acute on chronic respiratory failure with hypoxia and hypercapnia due to acute COPD exasperation as well as likely severe sleep apnea Sepsis due to ESBL UTI Pneumonia status post treatment Acute metabolic encephalopathy Acute renal failure Elevated troponin due to demand ischemia Chronic diastolic CHF Hyponatremia Diabetes type 2     Hospital Course  Patient is a 77 year old female with known history of CHF, aortic stenosis, diabetes, diabetic neuropathy, COPD, previous stroke and hypertension who is presented to the hospital with respiratory distress.  CT scan showed pneumonia.  Patient had to be started on IV antibiotics and placed on BiPAP.  Patient was subsequently just kept on BiPAP at nighttime.  And oxygen during the day.  Now she has a trilogy machine that is set up for home.  Initially physical therapy recommended rehab however her husband stated that he did not want her to go to rehab.  He states that he would be able to take care of her at home.  Home oxygen and trilogy machine have all been set up.   Overall prognosis for this patient is very poor I reiterated this multiple times to the husband.  She is at high risk of readmissions.  I have recommended palliative care team to follow her at home.         Consults  pulmonary/intensive  care  Significant Tests:  See full reports for all details     Dg Chest 1 View  Result Date: 06/03/2018 CLINICAL DATA:  Respiratory distress and altered mental status. EXAM: CHEST  1 VIEW COMPARISON:  04/13/2018. FINDINGS: Again noted is moderate cardiac enlargement. Bilateral pleural effusions and pulmonary vascular congestion noted. No airspace consolidation. IMPRESSION: 1. Suspect mild congestive heart failure. Electronically Signed   By: Kerby Moors M.D.   On: 06/03/2018 11:40   Dg Chest 2 View  Result Date: 06/08/2018 CLINICAL DATA:  Dyspnea EXAM: CHEST - 2 VIEW COMPARISON:  06/05/2018 chest radiograph. FINDINGS: Stable cardiomediastinal silhouette with mild cardiomegaly. No pneumothorax. Small bilateral pleural effusions, slightly increased bilaterally. No overt pulmonary edema. Patchy bibasilar consolidation is slightly worsened bilaterally. IMPRESSION: 1. Small bilateral pleural effusions, slightly increased bilaterally. 2. Patchy bibasilar lung consolidation, slightly worsened bilaterally, which could represent atelectasis, aspiration and/or pneumonia. 3. Stable mild cardiomegaly without overt pulmonary edema. Electronically Signed   By: Ilona Sorrel M.D.   On: 06/08/2018 07:49   Ct Head Wo Contrast  Result Date: 06/03/2018 CLINICAL DATA:  respiratory distress and AMS. EMS states patient discharged from [facility] today. Per staff patient potentially posturing. Pt on 4L  by EMS. Hx of DBM, HTN, PVD, CHF. EDP at bedside upon arrival and pt placed on Bipap upon arrival. CODE SEPSIS. TKV EXAM: CT HEAD WITHOUT CONTRAST TECHNIQUE: Contiguous axial images were obtained from the base of the skull through the vertex without intravenous contrast. COMPARISON:  04/13/2018 and previous FINDINGS: Brain: Diffuse parenchymal atrophy. Patchy areas of hypoattenuation in deep and periventricular white matter bilaterally. Negative for acute intracranial hemorrhage, mass lesion, acute infarction,  midline shift, or mass-effect. Acute infarct may be inapparent on noncontrast CT. Ventricles and sulci symmetric. Chronic bilateral basal ganglia mineralization. Vascular: Atherosclerotic and physiologic intracranial calcifications. Skull: Normal. Negative for fracture .1 cm calcified extra-axial lesion in the posterior cranial fossa on the right, stable since previous. Sinuses/Orbits: No acute finding. Other: None IMPRESSION: 1. Negative for bleed or other acute intracranial process. 2. Atrophy and nonspecific white matter changes. 3. 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 Electronically Signed   By: Lucrezia Europe M.D.   On: 06/03/2018 13:22   Ct Chest Wo Contrast  Result Date: 06/03/2018 CLINICAL DATA:  respiratory distress and AMS. EMS states patient discharged from [facility] today. Per staff patient potentially posturing. Pt on 4L [place] by EMS. Hx of DBM, HTN, PVD, CHF. EDP at bedside upon arrival and pt placed on Bipap upon arrival. CODE SEPSIS. TKV EXAM: CT CHEST WITHOUT CONTRAST TECHNIQUE: Multidetector CT imaging of the chest was performed following the standard protocol without IV contrast. COMPARISON:  None. FINDINGS: Cardiovascular: Mild predominantly left-sided cardiomegaly. Small pericardial effusion. Borderline dilated central pulmonary arteries. Mitral annulus calcifications. Heavy aortic leaflet calcifications. Aortic Atherosclerosis (ICD10-170.0) without aneurysm. Mediastinum/Nodes: No hilar or mediastinal adenopathy. Lungs/Pleura: Consolidation/atelectasis posteriorly in both lower lobes, right worse than left, with some air bronchograms on the right. Small nodular areas of consolidation in the posterior right upper lobe and superior segment right lower lobe. No significant pleural effusion. No pneumothorax. Upper Abdomen: No acute findings. Musculoskeletal: No chest wall mass or suspicious bone lesions identified.  IMPRESSION: 1. Asymmetric lower lobe consolidation right worse than left. 2. Nodular areas of consolidation in the right upper lung, presumably infectious/inflammatory. Non-contrast chest CT at 3-6 months is recommended to confirm appropriate resolution. If the nodules are stable at time of repeat CT, then future CT at 18-24 months (from today's scan) is considered optional for low-risk patients, but is recommended for high-risk patients. This recommendation follows the consensus statement: Guidelines for Management of Incidental Pulmonary Nodules Detected on CT Images: From the Fleischner Society 2017; Radiology 2017; 284:228-243. 3. Heavy aortic leaflet calcifications. Correlate with any echocardiographic evidence of aortic stenosis or insufficiency. Electronically Signed   By: Lucrezia Europe M.D.   On: 06/03/2018 13:32   Dg Chest Port 1 View  Result Date: 06/05/2018 CLINICAL DATA:  Pneumonia. EXAM: PORTABLE CHEST 1 VIEW COMPARISON:  Radiograph of June 04, 2018. FINDINGS: Stable cardiomegaly. No pneumothorax is noted. Stable mild bibasilar subsegmental atelectasis is noted with small pleural effusions. Bony thorax is unremarkable. IMPRESSION: Stable mild bibasilar subsegmental atelectasis with small pleural effusions. Electronically Signed   By: Marijo Conception, M.D.   On: 06/05/2018 07:21   Dg Chest Port 1 View  Result Date: 06/04/2018 CLINICAL DATA:  Acute respiratory failure.  Follow-up exam. EXAM: PORTABLE CHEST 1 VIEW COMPARISON:  06/03/2018 and older exams. FINDINGS: Stable enlargement of the cardiopericardial silhouette. Basilar opacity has improved compared to the previous day's study consistent with a reduction in sizes of pleural effusions and associated atelectasis. Remainder of the lungs is clear. No pneumothorax. IMPRESSION: 1. Improved lung aeration with decreased lung base atelectasis and smaller pleural effusions. 2. No new abnormalities.  No current evidence of pulmonary edema.  Electronically Signed   By: Lajean Manes M.D.   On: 06/04/2018 06:59       Today   Subjective:   [name] patient doing much better denies any complaints currently  Objective:   Blood pressure [name])  132/37, pulse 89, temperature 97.7 F (36.5 C), temperature source Oral, resp. rate 20, height 5' (1.524 m), weight 100.3 kg, SpO2 (!) 88 %.  .  Intake/Output Summary (Last 24 hours) at 06/10/2018 1344 Last data filed at 06/10/2018 0838 Gross per 24 hour  Intake 839.26 ml  Output 750 ml  Net 89.26 ml    Exam VITAL SIGNS: Blood pressure (!) 132/37, pulse 89, temperature 97.7 F (36.5 C), temperature source Oral, resp. rate 20, height 5' (1.524 m), weight 100.3 kg, SpO2 (!) 88 %.  GENERAL:  77 y.o.-year-old patient lying in the bed with no acute distress.  EYES: Pupils equal, round, reactive to light and accommodation. No scleral icterus. Extraocular muscles intact.  HEENT: Head atraumatic, normocephalic. Oropharynx and nasopharynx clear.  NECK:  Supple, no jugular venous distention. No thyroid enlargement, no tenderness.  LUNGS: Normal breath sounds bilaterally, no wheezing, rales,rhonchi or crepitation. No use of accessory muscles of respiration.  CARDIOVASCULAR: S1, S2 normal. No murmurs, rubs, or gallops.  ABDOMEN: Soft, nontender, nondistended. Bowel sounds present. No organomegaly or mass.  EXTREMITIES: No pedal edema, cyanosis, or clubbing.  NEUROLOGIC: Cranial nerves II through XII are intact. Muscle strength 5/5 in all extremities. Sensation intact. Gait not checked.  PSYCHIATRIC: The patient is alert and oriented x 3.  SKIN: No obvious rash, lesion, or ulcer.   Data Review     CBC w Diff:  Lab Results  Component Value Date   WBC 14.7 (H) 06/09/2018   HGB 11.4 (L) 06/09/2018   HCT 37.5 06/09/2018   PLT 178 06/09/2018   LYMPHOPCT 5 06/05/2018   MONOPCT 9 06/05/2018   EOSPCT 2 06/05/2018   BASOPCT 0 06/05/2018   CMP:  Lab Results  Component Value Date   NA  144 06/09/2018   K 4.4 06/09/2018   CL 99 06/09/2018   CO2 37 (H) 06/09/2018   BUN 13 06/09/2018   CREATININE 0.57 06/09/2018   PROT 6.9 06/03/2018   ALBUMIN 3.0 (L) 06/03/2018   BILITOT 1.6 (H) 06/03/2018   ALKPHOS 72 06/03/2018   AST 16 06/03/2018   ALT 10 06/03/2018  .  Micro Results Recent Results (from the past 240 hour(s))  Urine Culture     Status: Abnormal   Collection Time: 06/03/18 11:22 AM  Result Value Ref Range Status   Specimen Description   Final    URINE, CATHETERIZED Performed at [facility], [address], [place], [place] [address]    Special Requests   Final    NONE Performed at [facility], [address] [place], [place] [address]    Culture (A)  Final    >=100,000 COLONIES/mL ESCHERICHIA COLI Confirmed Extended Spectrum Beta-Lactamase Producer (ESBL).  In bloodstream infections from ESBL organisms, carbapenems are preferred over piperacillin/tazobactam. They are shown to have a lower risk of mortality.    Report Status 06/05/2018 FINAL  Final   Organism ID, Bacteria ESCHERICHIA COLI (A)  Final      Susceptibility   Escherichia coli - MIC*    AMPICILLIN >=32 RESISTANT Resistant     CEFAZOLIN >=64 RESISTANT Resistant     CEFTRIAXONE >=64 RESISTANT Resistant     CIPROFLOXACIN >=4 RESISTANT Resistant     GENTAMICIN <=1 SENSITIVE Sensitive     IMIPENEM <=0.25 SENSITIVE Sensitive     NITROFURANTOIN <=16 SENSITIVE Sensitive     TRIMETH/SULFA >=320 RESISTANT Resistant     AMPICILLIN/SULBACTAM >=32 RESISTANT Resistant     PIP/TAZO 64 INTERMEDIATE Intermediate  Extended ESBL POSITIVE Resistant     * >=100,000 COLONIES/mL ESCHERICHIA COLI  Culture, blood (routine x 2)     Status: Abnormal   Collection Time: 06/03/18 12:25 PM  Result Value Ref Range Status   Specimen Description   Final    BLOOD RIGHT HAND Performed at [facility], [address], [place], [place] [address]    Special Requests   Final    BOTTLES  DRAWN AEROBIC AND ANAEROBIC Blood Culture adequate volume Performed at [facility], [address] [place], [place] [address]    Culture  Setup Time   Final    GRAM POSITIVE RODS ANAEROBIC BOTTLE ONLY CRITICAL RESULT CALLED TO, READ BACK BY AND VERIFIED WITH: C/MATT MCBANE @0025  06/04/18 [facility] AEROBIC BOTTLE ONLY GRAM NEGATIVE RODS CRITICAL RESULT CALLED TO, READ BACK BY AND VERIFIED WITH: MATT MCBANE ON 06/08/18 AT 0330 [facility]    Culture (A)  Final    ESCHERICHIA COLI Confirmed Extended Spectrum Beta-Lactamase Producer (ESBL).  In bloodstream infections from ESBL organisms, carbapenems are preferred over piperacillin/tazobactam. They are shown to have a lower risk of mortality. CORRECTED RESULTS PREVIOUSLY REPORTED AS: GRAM POSITIVE RODS CORRECTED RESULTS CALLED TO: [place] [id number] BEAJ Performed at [facility], [place] [address], [place], [place] [address]    Report Status 06/09/2018 FINAL  Final   Organism ID, Bacteria ESCHERICHIA COLI  Final      Susceptibility   Escherichia coli - MIC*    AMPICILLIN >=32 RESISTANT Resistant     CEFAZOLIN >=64 RESISTANT Resistant     CEFEPIME >=64 RESISTANT Resistant     CEFTAZIDIME >=64 RESISTANT Resistant     CEFTRIAXONE >=64 RESISTANT Resistant     CIPROFLOXACIN >=4 RESISTANT Resistant     GENTAMICIN <=1 SENSITIVE Sensitive     IMIPENEM <=0.25 SENSITIVE Sensitive     TRIMETH/SULFA >=320 RESISTANT Resistant     AMPICILLIN/SULBACTAM >=32 RESISTANT Resistant     PIP/TAZO 64 INTERMEDIATE Intermediate     Extended ESBL POSITIVE Resistant     * ESCHERICHIA COLI  Blood Culture ID Panel (Reflexed)     Status: Abnormal   Collection Time: 06/03/18 12:25 PM  Result Value Ref Range Status   Enterococcus species NOT DETECTED NOT DETECTED Final   Listeria monocytogenes NOT DETECTED NOT DETECTED Final   Staphylococcus species NOT DETECTED NOT DETECTED Final   Staphylococcus aureus (BCID) NOT DETECTED NOT DETECTED Final    Streptococcus species NOT DETECTED NOT DETECTED Final   Streptococcus agalactiae NOT DETECTED NOT DETECTED Final   Streptococcus pneumoniae NOT DETECTED NOT DETECTED Final   Streptococcus pyogenes NOT DETECTED NOT DETECTED Final   Acinetobacter baumannii NOT DETECTED NOT DETECTED Final   Enterobacteriaceae species DETECTED (A) NOT DETECTED Final    Comment: Enterobacteriaceae represent a large family of gram-negative bacteria, not a single organism. CRITICAL RESULT CALLED TO, READ BACK BY AND VERIFIED WITH: MATT MCBANE ON 06/08/18 AT 0330 [place]    Enterobacter cloacae complex NOT DETECTED NOT DETECTED Final   Escherichia coli DETECTED (A) NOT DETECTED Final    Comment: CRITICAL RESULT CALLED TO, READ BACK BY AND VERIFIED WITH: MATT MCBANE ON 06/08/18 AT 0330 [place]    Klebsiella oxytoca NOT DETECTED NOT DETECTED Final   Klebsiella pneumoniae NOT DETECTED NOT DETECTED Final   Proteus species NOT DETECTED NOT DETECTED Final   Serratia marcescens NOT DETECTED NOT DETECTED Final   Carbapenem resistance NOT DETECTED NOT DETECTED Final   Haemophilus influenzae NOT DETECTED NOT  DETECTED Final   Neisseria meningitidis NOT DETECTED NOT DETECTED Final   Pseudomonas aeruginosa NOT DETECTED NOT DETECTED Final   Candida albicans NOT DETECTED NOT DETECTED Final   Candida glabrata NOT DETECTED NOT DETECTED Final   Candida krusei NOT DETECTED NOT DETECTED Final   Candida parapsilosis NOT DETECTED NOT DETECTED Final   Candida tropicalis NOT DETECTED NOT DETECTED Final    Comment: Performed at [facility], [address] [place], [place] [address]  MRSA PCR Screening     Status: None   Collection Time: 06/03/18  4:25 PM  Result Value Ref Range Status   MRSA by PCR NEGATIVE NEGATIVE Final    Comment:        The GeneXpert MRSA Assay (FDA approved for NASAL specimens only), is one component of a comprehensive MRSA colonization surveillance program. It is not intended to diagnose  MRSA infection nor to guide or monitor treatment for MRSA infections. Performed at [facility], [address] [place], [place] [address]   Culture, blood (routine x 2)     Status: None   Collection Time: 06/03/18  4:51 PM  Result Value Ref Range Status   Specimen Description BLOOD BLOOD RIGHT HAND  Final   Special Requests   Final    BOTTLES DRAWN AEROBIC ONLY Blood Culture results may not be optimal due to an inadequate volume of blood received in culture bottles   Culture   Final    NO GROWTH 5 DAYS Performed at [facility], [address] [place], [place] [address]    Report Status 06/08/2018 FINAL  Final  C difficile quick scan w PCR reflex     Status: None   Collection Time: 06/07/18 12:15 AM  Result Value Ref Range Status   C Diff antigen NEGATIVE NEGATIVE Final   C Diff toxin NEGATIVE NEGATIVE Final   C Diff interpretation No C. difficile detected.  Final    Comment: Performed at [facility], [address] [place], [place] [address]  Gastrointestinal Panel by PCR , Stool     Status: None   Collection Time: 06/07/18 12:15 AM  Result Value Ref Range Status   Campylobacter species NOT DETECTED NOT DETECTED Final   Plesimonas shigelloides NOT DETECTED NOT DETECTED Final   Salmonella species NOT DETECTED NOT DETECTED Final   Yersinia enterocolitica NOT DETECTED NOT DETECTED Final   Vibrio species NOT DETECTED NOT DETECTED Final   Vibrio cholerae NOT DETECTED NOT DETECTED Final   Enteroaggregative E coli (EAEC) NOT DETECTED NOT DETECTED Final   Enteropathogenic E coli (EPEC) NOT DETECTED NOT DETECTED Final   Enterotoxigenic E coli (ETEC) NOT DETECTED NOT DETECTED Final   Shiga like toxin producing E coli (STEC) NOT DETECTED NOT DETECTED Final   Shigella/Enteroinvasive E coli (EIEC) NOT DETECTED NOT DETECTED Final   Cryptosporidium NOT DETECTED NOT DETECTED Final   Cyclospora cayetanensis NOT DETECTED NOT DETECTED Final   Entamoeba  histolytica NOT DETECTED NOT DETECTED Final   Giardia lamblia NOT DETECTED NOT DETECTED Final   Adenovirus F40/41 NOT DETECTED NOT DETECTED Final   Astrovirus NOT DETECTED NOT DETECTED Final   Norovirus GI/GII NOT DETECTED NOT DETECTED Final   Rotavirus A NOT DETECTED NOT DETECTED Final   Sapovirus (I, II, IV, and V) NOT DETECTED NOT DETECTED Final    Comment: Performed at [facility], [address], [place],  [address]        Code Status Orders  (From admission, onward)  Start     Ordered   06/03/18 1512  Full code  Continuous     06/03/18 1511        Code Status History    Date Active Date Inactive Code Status Order ID Comments User Context   04/10/2018 1437 04/17/2018 1946 Full Code [id number]  Loletha Grayer, MD ED   08/29/2016 0516 09/17/2016 1938 Full Code [id number]  Etta Quill, DO ED   07/05/2016 1755 07/09/2016 2131 Full Code [id number]  Shela Leff, MD Inpatient   03/01/2016 1524 03/13/2016 2119 Full Code [id number]  [name], MD Inpatient   03/01/2016 1450 03/01/2016 1524 Full Code [id number]  Marliss Coots, PA-C Inpatient   03/01/2016 1104 03/01/2016 1450 Full Code [id number]  Roland Earl, MD Inpatient   02/28/2016 1915 03/01/2016 1104 Full Code [id number]  Ulyses Amor, PA-C Inpatient   02/12/2014 0708 02/13/2014 1850 Full Code [id number]  Oswald Hillock, MD Inpatient   02/08/2014 1317 02/10/2014 1758 Full Code [id number]  Wylene Simmer, MD Inpatient   02/03/2014 1459 02/08/2014 1317 Full Code [id number]  Radene Gunning, NP Inpatient          Follow-up Information    [facility] Follow up on 06/19/2018.   Specialty:  Cardiology Why:  at 10:40am Contact information: [place] [phone]          Discharge Medications   Allergies as of 06/10/2018      Reactions   Iodine Anaphylaxis   Penicillins Anaphylaxis   Tolerates  ceftriaxone, cefazolin Has patient had a PCN reaction causing immediate rash, facial/tongue/throat swelling, SOB or lightheadedness with hypotension: Unknown Has patient had a PCN reaction causing severe rash involving mucus membranes or skin necrosis: Unknown Has patient had a PCN reaction that required hospitalization: Unknown Has patient had a PCN reaction occurring within the last 10 years: Unknown If all of the above answers are "NO", then may proceed with Cephalosporin u   Shellfish Allergy Anaphylaxis   Shellfish-derived Products Anaphylaxis   Sulfa Antibiotics Anaphylaxis   Sulfacetamide Sodium Anaphylaxis   Sulfasalazine Anaphylaxis   Morphine And Related Other (See Comments)   Altered mental status      Medication List    STOP taking these medications   insulin aspart 100 UNIT/ML injection Commonly known as:  novoLOG     TAKE these medications   acetaminophen 500 MG tablet Commonly known as:  TYLENOL Take 1,000 mg by mouth every 6 (six) hours as needed for mild pain.   albuterol 108 (90 Base) MCG/ACT inhaler Commonly known as:  PROVENTIL HFA;VENTOLIN HFA Inhale 2 puffs into the lungs every 6 (six) hours as needed for wheezing or shortness of breath.   aspirin 325 MG tablet Take 1 tablet (325 mg total) by mouth daily with breakfast. What changed:  when to take this   atorvastatin 20 MG tablet Commonly known as:  LIPITOR Take 1 tablet (20 mg total) by mouth daily.   budesonide 0.25 MG/2ML nebulizer solution Commonly known as:  PULMICORT Take 2 mLs (0.25 mg total) by nebulization 2 (two) times daily.   carvedilol 6.25 MG tablet Commonly known as:  COREG Take 6.25 mg by mouth 2 (two) times daily.   cetirizine 10 MG tablet Commonly known as:  ZYRTEC Take 10 mg by mouth daily.   cholecalciferol 1000 units tablet Commonly known as:  VITAMIN D Take 2,000 Units by mouth daily.   clopidogrel 75  MG tablet Commonly known as:  PLAVIX Take 1 tablet (75 mg total)  by mouth daily with breakfast.   docusate sodium 100 MG capsule Commonly known as:  COLACE Take 100 mg by mouth 2 (two) times daily.   fluticasone 50 MCG/ACT nasal spray Commonly known as:  FLONASE Place 1 spray into both nostrils every evening.   insulin glargine 100 UNIT/ML injection Commonly known as:  LANTUS Inject 0.27 mLs (27 Units total) into the skin at bedtime. What changed:  how much to take   ipratropium-albuterol 0.5-2.5 (3) MG/3ML Soln Commonly known as:  DUONEB Take 3 mLs by nebulization 2 (two) times daily.   isosorbide dinitrate 30 MG tablet Commonly known as:  ISORDIL Take 30 mg by mouth daily.   modafinil 100 MG tablet Commonly known as:  PROVIGIL Take 2 tablets (200 mg total) by mouth daily. Start taking on:  06/11/2018   nitrofurantoin (macrocrystal-monohydrate) 100 MG capsule Commonly known as:  MACROBID Take 1 capsule (100 mg total) by mouth 2 (two) times daily for 7 days.   nitroGLYCERIN 0.4 MG SL tablet Commonly known as:  NITROSTAT Place 1 tablet (0.4 mg total) under the tongue every 5 (five) minutes as needed for chest pain.   nystatin ointment Commonly known as:  MYCOSTATIN Apply 1 application topically 3 (three) times daily. Apply to area below breasts   omeprazole 20 MG capsule Commonly known as:  PRILOSEC Take 1 capsule (20 mg total) by mouth daily.   polyethylene glycol packet Commonly known as:  MIRALAX / GLYCOLAX Take 17 g by mouth 2 (two) times daily.   potassium chloride SA 20 MEQ tablet Commonly known as:  K-DUR,KLOR-CON Take 20 mEq by mouth daily.   pregabalin 50 MG capsule Commonly known as:  LYRICA Take 50 mg by mouth 2 (two) times daily.   torsemide 20 MG tablet Commonly known as:  DEMADEX Take 2 tablets (40 mg total) by mouth 2 (two) times daily.   VITAMIN E PO Take 1 capsule by mouth daily.            Durable Medical Equipment  (From admission, onward)         Start     Ordered   06/10/18 1049  DME  Oxygen  Once    Question Answer Comment  Mode or (Route) Nasal cannula   Liters per Minute 2   Frequency Continuous (stationary and portable oxygen unit needed)   Oxygen delivery system Gas      06/10/18 1049   06/10/18 1047  For home use only DME Nebulizer machine  Once    Question:  Patient needs a nebulizer to treat with the following condition  Answer:  COPD (chronic obstructive pulmonary disease) ([place])   06/10/18 1049   06/10/18 1047  DME Oxygen  Once    Question Answer Comment  Mode or (Route) Nasal cannula   Liters per Minute 2   Frequency Continuous (stationary and portable oxygen unit needed)   Oxygen delivery system Gas      06/10/18 1049             Total Time in preparing paper work, data evaluation and todays exam - 37 minutes  Dustin Flock M.D on 06/10/2018 at [place]  [phone]

## 2018-06-19 ENCOUNTER — Other Ambulatory Visit: Payer: Self-pay

## 2018-06-19 ENCOUNTER — Ambulatory Visit: Payer: Medicare HMO | Admitting: Family

## 2018-06-19 ENCOUNTER — Emergency Department: Payer: Medicare HMO

## 2018-06-19 ENCOUNTER — Emergency Department
Admission: EM | Admit: 2018-06-19 | Discharge: 2018-06-19 | Disposition: A | Discharge status: Home / Self Care | Payer: Medicare HMO | Attending: Emergency Medicine | Admitting: Emergency Medicine

## 2018-06-19 DIAGNOSIS — I5030 Unspecified diastolic (congestive) heart failure: Secondary | ICD-10-CM | POA: Insufficient documentation

## 2018-06-19 DIAGNOSIS — Z7982 Long term (current) use of aspirin: Secondary | ICD-10-CM | POA: Insufficient documentation

## 2018-06-19 DIAGNOSIS — Z8673 Personal history of transient ischemic attack (TIA), and cerebral infarction without residual deficits: Secondary | ICD-10-CM | POA: Diagnosis not present

## 2018-06-19 DIAGNOSIS — Z7902 Long term (current) use of antithrombotics/antiplatelets: Secondary | ICD-10-CM | POA: Diagnosis not present

## 2018-06-19 DIAGNOSIS — Z794 Long term (current) use of insulin: Secondary | ICD-10-CM | POA: Diagnosis not present

## 2018-06-19 DIAGNOSIS — R109 Unspecified abdominal pain: Secondary | ICD-10-CM | POA: Diagnosis present

## 2018-06-19 DIAGNOSIS — E119 Type 2 diabetes mellitus without complications: Secondary | ICD-10-CM | POA: Diagnosis not present

## 2018-06-19 DIAGNOSIS — N390 Urinary tract infection, site not specified: Secondary | ICD-10-CM | POA: Diagnosis not present

## 2018-06-19 DIAGNOSIS — I110 Hypertensive heart disease with heart failure: Secondary | ICD-10-CM | POA: Diagnosis not present

## 2018-06-19 DIAGNOSIS — Z79899 Other long term (current) drug therapy: Secondary | ICD-10-CM | POA: Diagnosis not present

## 2018-06-19 LAB — URINALYSIS, COMPLETE (UACMP) WITH MICROSCOPIC
Bilirubin Urine: NEGATIVE
Glucose, UA: NEGATIVE mg/dL
Ketones, ur: NEGATIVE mg/dL
NITRITE: NEGATIVE
PROTEIN: NEGATIVE mg/dL
Specific Gravity, Urine: 1.009 (ref 1.005–1.030)
WBC, UA: >50 WBC/hpf — ABNORMAL HIGH (ref 0–5)
pH: 6 (ref 5.0–8.0)

## 2018-06-19 LAB — COMPREHENSIVE METABOLIC PANEL
ALK PHOS: 79 U/L (ref 38–126)
ALT: 22 U/L (ref 0–44)
AST: 35 U/L (ref 15–41)
Albumin: 2.9 g/dL — ABNORMAL LOW (ref 3.5–5.0)
Anion gap: 6 (ref 5–15)
BILIRUBIN TOTAL: 0.7 mg/dL (ref 0.3–1.2)
BUN: 10 mg/dL (ref 8–23)
CALCIUM: 9 mg/dL (ref 8.9–10.3)
CO2: 40 mmol/L — AB (ref 22–32)
CREATININE: 0.49 mg/dL (ref 0.44–1.00)
Chloride: 97 mmol/L — ABNORMAL LOW (ref 98–111)
GFR calc non Af Amer: >60 mL/min (ref >60)
Glucose, Bld: 90 mg/dL (ref 70–99)
Potassium: 3.9 mmol/L (ref 3.5–5.1)
SODIUM: 143 mmol/L (ref 135–145)
TOTAL PROTEIN: 6.4 g/dL — AB (ref 6.5–8.1)

## 2018-06-19 LAB — CBC
HEMATOCRIT: 42 % (ref 36.0–46.0)
HEMOGLOBIN: 12.8 g/dL (ref 12.0–15.0)
MCH: 26.8 pg (ref 26.0–34.0)
MCHC: 30.5 g/dL (ref 30.0–36.0)
MCV: 88.1 fL (ref 80.0–100.0)
Platelets: 259 10*3/uL (ref 150–400)
RBC: 4.77 MIL/uL (ref 3.87–5.11)
RDW: 16.7 % — ABNORMAL HIGH (ref 11.5–15.5)
WBC: 8 10*3/uL (ref 4.0–10.5)
nRBC: 0 % (ref 0.0–0.2)

## 2018-06-19 LAB — TROPONIN I: Troponin I: 0.15 ng/mL (ref <0.03)

## 2018-06-19 MED ORDER — SODIUM CHLORIDE 0.9 % IV SOLN
1.0000 g | Freq: Once | INTRAVENOUS | Status: AC
  Administered 2018-06-19: 1 g via INTRAVENOUS
  Filled 2018-06-19: qty 10

## 2018-06-19 MED ORDER — NITROFURANTOIN MONOHYD MACRO 100 MG PO CAPS: 100.0000 mg | ORAL_CAPSULE | Freq: Two times a day (BID) | ORAL | 0 refills | Status: AC

## 2018-06-19 NOTE — ED Provider Notes (Signed)
[facility] Emergency Department Provider Note   ____________________________________________    I have reviewed the triage vital signs and the nursing notes.   HISTORY  Chief Complaint Abdominal Pain     HPI [name] is a 77 y.o. female with a history of diabetes, CHF, hypertension who presents today with complaints of abdominal pain.  She states her entire abdomen is "sore ".  Denies chest pain.  No fevers or chills.  No vomiting.  Patient is a poor historian.  Review of medical records demonstrates admission for metabolic encephalopathy in the past related to possible pneumonia.   Past Medical History:  Diagnosis Date  . Acute heart failure ([place])   . Aortic stenosis   . CHF (congestive heart failure) ([place])   . Complication of anesthesia    Hard to wake patient up after having anesthesia  . Diabetes mellitus without complication ([place])   . Diabetic neuropathy (HCC)    Takes Lyrica  . Edema of both legs    Takes Lasix  . GERD (gastroesophageal reflux disease)   . High cholesterol   . HTN (hypertension)   . Hypertension   . PAD (peripheral artery disease) ([place])   . Shortness of breath dyspnea   . Spasm of back muscles   . Stroke ([place])   . Wound, open    Right great toe    Patient Active Problem List   Diagnosis Date Noted  . Healthcare-associated pneumonia 06/03/2018  . Acute cystitis 04/10/2018  . Sepsis due to Streptococcus, group B ([place])   . Multiple open wounds of lower leg   . PVD (peripheral vascular disease) ([place])   . Bacteremia 08/31/2016  . Sepsis ([place]) 08/29/2016  . Benign essential HTN   . Acute on chronic diastolic heart failure ([place])   . Ulcer of right lower extremity ([place])   . Chest pain 07/05/2016  . Impacted cerumen of both ears 06/05/2016  . 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 Pleural effusion, bilateral   . Wheeze   . Dyspnea   . CHF (congestive heart failure) ([place])   . Right  sided weakness   . Acute respiratory acidosis   . Surgery, elective   . Anemia, iron deficiency   . Leukocytosis   . Cerebrovascular accident (CVA) due to thrombosis of precerebral artery ([place]) 03/01/2016  . 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 PAD (peripheral artery disease) ([place]) 02/28/2016  . Atherosclerotic peripheral vascular disease with intermittent claudication ([place]) 12/08/2015  . Open toe wound 05/04/2014  . Anemia, chronic disease 04/27/2014  . Occult blood positive stool 04/17/2014  . Edema 04/03/2014  . Unspecified hereditary and idiopathic peripheral neuropathy 03/14/2014  . DM2 (diabetes mellitus, type 2) ([place]) 02/14/2014  . Altered mental status 02/12/2014  . Acute encephalopathy 02/12/2014  . Aortic stenosis 02/04/2014  . Closed left subtrochanteric femur fracture ([place]) 02/03/2014  . Hip fracture, left ([place]) 02/03/2014  . Hypokalemia 02/03/2014  . Fibula fracture 02/03/2014  . MTP instability 02/03/2014  . Hip fracture ([place]) 02/03/2014  . HTN (hypertension)   . HLD (hyperlipidemia)     Past Surgical History:  Procedure Laterality Date  . BYPASS GRAFT POPLITEAL TO TIBIAL Right 02/28/2016   Procedure: BYPASS GRAFT RIGHT BELOW KNEE POPLITEAL TO PERONEAL USING REVERSED RIGHT GREATER SAPHENOUS VEIN;  Surgeon: Conrad [place], MD;  Location: [facility];  Service: Vascular;  Laterality: Right;  . CYST EXCISION  Abdomen  . EYE SURGERY Bilateral    Cataract removal  . INTRAMEDULLARY (IM) NAIL INTERTROCHANTERIC Left 02/04/2014   Procedure: INTRAMEDULLARY (IM) NAIL INTERTROCHANTRIC FEMORAL;  Surgeon: Mauri Pole, MD;  Location: [facility];  Service: Orthopedics;  Laterality: Left;  . IR GENERIC HISTORICAL  03/01/2016   IR ANGIO INTRA EXTRACRAN SEL COM CAROTID INNOMINATE UNI R MOD SED 03/01/2016 [name], MD [facility]  . IR GENERIC HISTORICAL  03/01/2016   IR ENDOVASC INTRACRANIAL INF OTHER THAN THROMBO ART INC DIAG ANGIO  03/01/2016 [name], MD [facility]  . IR GENERIC HISTORICAL  03/01/2016   IR INTRAVSC STENT CERV CAROTID W/O EMB-PROT MOD SED INC ANGIO 03/01/2016 [name], MD [facility]  . IR GENERIC HISTORICAL  06/03/2016   IR RADIOLOGIST EVAL & MGMT 06/03/2016 [facility]  . ORIF TOE FRACTURE Right 02/08/2014   Procedure: OPEN REDUCTION INTERNAL FIXATION Right METATARSAL  FRACTURE ;  Surgeon: Wylene Simmer, MD;  Location: [facility];  Service: Orthopedics;  Laterality: Right;  . PERIPHERAL VASCULAR CATHETERIZATION N/A 12/28/2015   Procedure: Abdominal Aortogram w/Lower Extremity;  Surgeon: Conrad Lorena, MD;  Location: [facility];  Service: Cardiovascular;  Laterality: N/A;  . RADIOLOGY WITH ANESTHESIA N/A 03/01/2016   Procedure: RADIOLOGY WITH ANESTHESIA;  Surgeon: [name], MD;  Location: [facility];  Service: Radiology;  Laterality: N/A;  . VEIN HARVEST Right 02/28/2016   Procedure: RIGHT GREATER SAPHENOUS VEIN HARVEST;  Surgeon: Conrad Fulton, MD;  Location: [facility];  Service: Vascular;  Laterality: Right;    Prior to Admission medications   Medication Sig Start Date End Date Taking? Authorizing Provider  acetaminophen (TYLENOL) 500 MG tablet Take 1,000 mg by mouth every 6 (six) hours as needed for mild pain.    [provider]  albuterol (PROVENTIL HFA;VENTOLIN HFA) 108 (90 Base) MCG/ACT inhaler Inhale 2 puffs into the lungs every 6 (six) hours as needed for wheezing or shortness of breath.     [provider]  aspirin 325 MG tablet Take 1 tablet (325 mg total) by mouth daily with breakfast. Patient taking differently: Take 325 mg by mouth daily.  07/10/16   Holley Raring, MD  atorvastatin (LIPITOR) 20 MG tablet Take 1 tablet (20 mg total) by mouth daily. 03/13/16   Virgina Jock A, PA-C  budesonide (PULMICORT) 0.25 MG/2ML nebulizer solution Take 2 mLs (0.25 mg total) by nebulization 2 (two) times daily. 06/10/18   Dustin Flock, MD  carvedilol (COREG) 6.25 MG  tablet Take 6.25 mg by mouth 2 (two) times daily.     [provider]  cetirizine (ZYRTEC) 10 MG tablet Take 10 mg by mouth daily.     [provider]  cholecalciferol (VITAMIN D) 1000 units tablet Take 2,000 Units by mouth daily.     [provider]  clopidogrel (PLAVIX) 75 MG tablet Take 1 tablet (75 mg total) by mouth daily with breakfast. 03/04/17   Conrad [place], MD  docusate sodium (COLACE) 100 MG capsule Take 100 mg by mouth 2 (two) times daily.    [provider]  fluticasone (FLONASE) 50 MCG/ACT nasal spray Place 1 spray into both nostrils every evening.     [provider]  insulin glargine (LANTUS) 100 UNIT/ML injection Inject 0.27 mLs (27 Units total) into the skin at bedtime. Patient taking differently: Inject 40 Units into the skin at bedtime.  09/16/16   Reyne Dumas, MD  ipratropium-albuterol (DUONEB) 0.5-2.5 (3) MG/3ML SOLN Take 3 mLs by nebulization  2 (two) times daily. 06/10/18   Dustin Flock, MD  isosorbide dinitrate (ISORDIL) 30 MG tablet Take 30 mg by mouth daily.  04/16/17   [provider]  modafinil (PROVIGIL) 100 MG tablet Take 2 tablets (200 mg total) by mouth daily. 06/11/18   Dustin Flock, MD  nitroGLYCERIN (NITROSTAT) 0.4 MG SL tablet Place 1 tablet (0.4 mg total) under the tongue every 5 (five) minutes as needed for chest pain. 07/09/16   Holley Raring, MD  nystatin ointment (MYCOSTATIN) Apply 1 application topically 3 (three) times daily. Apply to area below breasts    [provider]  omeprazole (PRILOSEC) 20 MG capsule Take 1 capsule (20 mg total) by mouth daily. 04/17/18   Henreitta Leber, MD  polyethylene glycol (MIRALAX / GLYCOLAX) packet Take 17 g by mouth 2 (two) times daily. 02/09/14   Thurnell Lose, MD  potassium chloride SA (K-DUR,KLOR-CON) 20 MEQ tablet Take 20 mEq by mouth daily.    [provider]  pregabalin (LYRICA) 50 MG capsule Take 50 mg by mouth 2 (two) times daily.      [provider]  torsemide (DEMADEX) 20 MG tablet Take 2 tablets (40 mg total) by mouth 2 (two) times daily. 04/17/18   Henreitta Leber, MD  VITAMIN E PO Take 1 capsule by mouth daily.    [provider]     Allergies Iodine; Penicillins; Shellfish allergy; Shellfish-derived products; Sulfa antibiotics; Sulfacetamide sodium; Sulfasalazine; and Morphine and related  Family History  Problem Relation Age of Onset  . Diabetes Other   . Liver disease Mother   . CVA Father   . Diabetes Father   . Hypertension Father     Social History Social History   Tobacco Use  . Smoking status: Never Smoker  . Smokeless tobacco: Never Used  . Tobacco comment: Never smoked  Substance Use Topics  . Alcohol use: No    Alcohol/week: 0.0 standard drinks  . Drug use: No    Review of Systems limited as poor historian  Constitutional: No dizziness Eyes: No visual changes.  ENT: No neck pain Cardiovascular: Denies chest pain. Respiratory: Denies shortness of breath. Gastrointestinal: As above Genitourinary: Negative for dysuria. Musculoskeletal: Occasional left arm pain Skin: Negative for rash. Neurological: Negative for weakness   ____________________________________________   PHYSICAL EXAM:  VITAL SIGNS: ED Triage Vitals [06/19/18 1734]  Enc Vitals Group     BP (!) 198/61     Pulse Rate 77     Resp (!) 26     Temp 97.7 F (36.5 C)     Temp src      SpO2 97 %     Weight 100.3 kg (221 lb 1.9 oz)     Height 1.524 m (5')     Head Circumference      Peak Flow      Pain Score 10     Pain Loc      Pain Edu?      Excl. in [place]?     Constitutional: Alert, appears to answer questions appropriately Eyes: Conjunctivae are normal.   Nose: No congestion/rhinnorhea. Mouth/Throat: Mucous membranes are moist.   Cardiovascular: Normal rate, regular rhythm. Grossly normal heart sounds.  Good peripheral circulation. Respiratory: Normal respiratory effort.  No retractions.  Lungs CTAB.  Gastrointestinal: Soft and nontender. No distention.  No significant tenderness to palpation reassuring exam  Musculoskeletal: .  Warm and well perfused Neurologic:  Normal speech and language. No gross focal neurologic deficits are  appreciated.  Skin:  Skin is warm, dry and intact. No rash noted.   ____________________________________________   LABS (all labs ordered are listed, but only abnormal results are displayed)  Labs Reviewed  CBC - Abnormal; Notable for the following components:      Result Value   RDW 16.7 (*)    All other components within normal limits  COMPREHENSIVE METABOLIC PANEL - Abnormal; Notable for the following components:   Chloride 97 (*)    CO2 40 (*)    Total Protein 6.4 (*)    Albumin 2.9 (*)    All other components within normal limits  TROPONIN I - Abnormal; Notable for the following components:   Troponin I 0.15 (*)    All other components within normal limits  BLOOD GAS, VENOUS - Abnormal; Notable for the following components:   pCO2, Ven 78 (*)    Bicarbonate 46.1 (*)    Acid-Base Excess 16.6 (*)    All other components within normal limits  URINALYSIS, COMPLETE (UACMP) WITH MICROSCOPIC - Abnormal; Notable for the following components:   Color, Urine YELLOW (*)    APPearance CLOUDY (*)    Hgb urine dipstick SMALL (*)    Leukocytes, UA LARGE (*)    WBC, UA >50 (*)    Bacteria, UA MANY (*)    Non Squamous Epithelial PRESENT (*)    All other components within normal limits  URINE CULTURE   ____________________________________________  EKG  ED ECG REPORT I, Lavonia Drafts, the attending physician, personally viewed and interpreted this ECG.  Date: 06/19/2018  Rhythm: normal sinus rhythm QRS Axis: normal Intervals: normal ST/T Wave abnormalities: normal Narrative Interpretation: no evidence of acute ischemia  ____________________________________________  RADIOLOGY  Chest x-ray unremarkable KUB  unremarkable ____________________________________________   PROCEDURES  Procedure(s) performed: No  Procedures   Critical Care performed: No ____________________________________________   INITIAL IMPRESSION / ASSESSMENT AND PLAN / ED COURSE  Pertinent labs & imaging results that were available during my care of the patient were reviewed by me and considered in my medical decision making (see chart for details).  Patient presents reportedly for abdominal pain.  On exam she has no tenderness in her abdomen, question whether has some baseline confusion.  Will obtain labs, chest x-ray, KUB, urinalysis and reexamined.  EMS reports the patient was slated to be put in a nursing home with husband refused and thought that he could care for her at home but that is obviously not the case  Patient CO2 is elevated at 78, she does appear to have chronically elevated CO2, apparently she wears BiPAP at night.  Troponin 0.15, this appears to be declining from last hospital visit, she has no chest pain.  Chest x-ray and KUB unremarkable   ----------------------------------------- 7:56 PM on 06/19/2018 -----------------------------------------  Family is here, they report that she is at her baseline.  Discussed with her the evidence of urinary tract infection, will give IV Rocephin, she is tolerated this in the past.  They are quite comfortable with her being discharged with outpatient antibiotics.  We will add culture       ____________________________________________   FINAL CLINICAL IMPRESSION(S) / ED DIAGNOSES  Final diagnoses:  Lower urinary tract infectious disease        Note:  This document was prepared using Dragon voice recognition software and may include unintentional dictation errors.    Lavonia Drafts, MD 06/19/18 [phone]

## 2018-06-19 NOTE — ED Notes (Signed)
MD Corky Downs at bedside updating family on plan of care

## 2018-06-19 NOTE — ED Notes (Signed)
Pt's son signed discharge instructions and has no questions at this time.

## 2018-06-19 NOTE — ED Notes (Signed)
Per Anderson Malta @ [facility] - since the patient has been here only 3 hrs, and EMS is not busy at this time, they will be glad to assist in returning the patient home.  [place] could not assist.

## 2018-06-19 NOTE — ED Triage Notes (Addendum)
Pt comes via [facility] from home with c/o upper quad abdominal pain. EMS performed EKG-NSR. VSS  Pt has hx of GERD, and has been burping and some gas. Pt also c/o back pain. EMS states ulcers per family. Pt is bedbound and wear 2L O2 chronically.  Pt does have home health.  Pt is alert and oriented.

## 2018-06-19 NOTE — ED Notes (Signed)
Pt cleaned up and repositioned in bed. 

## 2018-06-19 NOTE — ED Notes (Signed)
Pt has scar and some dryness and peeling noted right lower leg. Pt states she had surgery on her leg in the past. Pt also states that she has wound in her rectum. Pt states pain when wiping while I was cleaning her up. Pt resting comfortably at this time. Family at bedside.

## 2018-06-19 NOTE — ED Notes (Signed)
Pt given applesauce, ice cream and water at this time

## 2018-06-19 NOTE — ED Notes (Signed)
Family at bedside. Updated family on information that I had. Will inform MD to speak with family and update on plan of care.

## 2018-06-19 NOTE — ED Notes (Signed)
Pt cleaned up and respostioned in bed.

## 2018-06-19 NOTE — ED Notes (Signed)
Date and time results received: 11/15/191829 (use smartphrase ".now" to insert current time)  Test: troponin Critical Value: 0.15  Name of Provider Notified: Kinner MD  Orders Received? Or Actions Taken?: Orders Received - See Orders for details

## 2018-06-20 LAB — BLOOD GAS, ARTERIAL
Acid-Base Excess: 20.6 mmol/L — ABNORMAL HIGH (ref 0.0–2.0)
Acid-Base Excess: 21.3 mmol/L — ABNORMAL HIGH (ref 0.0–2.0)
BICARBONATE: 49.1 mmol/L — AB (ref 20.0–28.0)
Bicarbonate: 46.2 mmol/L — ABNORMAL HIGH (ref 20.0–28.0)
Delivery systems: POSITIVE
Expiratory PAP: 8
FIO2: 0.3
FIO2: 30
INSPIRATORY PAP: 18
O2 SAT: 97.6 %
O2 Saturation: 95.8 %
PATIENT TEMPERATURE: 37
PCO2 ART: 54 mmHg — AB (ref 32.0–48.0)
PCO2 ART: 69 mmHg — AB (ref 32.0–48.0)
PH ART: 7.46 — AB (ref 7.350–7.450)
PO2 ART: 86 mmHg (ref 83.0–108.0)
PO2 ART: 76 mmHg — AB (ref 83.0–108.0)
Patient temperature: 37
pH, Arterial: 7.54 — ABNORMAL HIGH (ref 7.350–7.450)

## 2018-06-20 LAB — BLOOD GAS, VENOUS
Acid-Base Excess: 16.6 mmol/L — ABNORMAL HIGH (ref 0.0–2.0)
Bicarbonate: 46.1 mmol/L — ABNORMAL HIGH (ref 20.0–28.0)
PATIENT TEMPERATURE: 37
pCO2, Ven: 78 mmHg (ref 44.0–60.0)
pH, Ven: 7.38 (ref 7.250–7.430)

## 2018-06-22 LAB — URINE CULTURE

## 2018-07-10 ENCOUNTER — Emergency Department: Payer: Medicare HMO

## 2018-07-10 ENCOUNTER — Other Ambulatory Visit: Payer: Self-pay

## 2018-07-10 ENCOUNTER — Emergency Department
Admission: EM | Admit: 2018-07-10 | Discharge: 2018-07-10 | Disposition: A | Discharge status: Home / Self Care | Payer: Medicare HMO | Attending: Emergency Medicine | Admitting: Emergency Medicine

## 2018-07-10 DIAGNOSIS — E119 Type 2 diabetes mellitus without complications: Secondary | ICD-10-CM | POA: Diagnosis not present

## 2018-07-10 DIAGNOSIS — Z7982 Long term (current) use of aspirin: Secondary | ICD-10-CM | POA: Diagnosis not present

## 2018-07-10 DIAGNOSIS — N3090 Cystitis, unspecified without hematuria: Secondary | ICD-10-CM | POA: Insufficient documentation

## 2018-07-10 DIAGNOSIS — I509 Heart failure, unspecified: Secondary | ICD-10-CM | POA: Insufficient documentation

## 2018-07-10 DIAGNOSIS — R531 Weakness: Secondary | ICD-10-CM | POA: Insufficient documentation

## 2018-07-10 DIAGNOSIS — Z8673 Personal history of transient ischemic attack (TIA), and cerebral infarction without residual deficits: Secondary | ICD-10-CM | POA: Diagnosis not present

## 2018-07-10 DIAGNOSIS — I110 Hypertensive heart disease with heart failure: Secondary | ICD-10-CM | POA: Insufficient documentation

## 2018-07-10 DIAGNOSIS — Z79899 Other long term (current) drug therapy: Secondary | ICD-10-CM | POA: Insufficient documentation

## 2018-07-10 DIAGNOSIS — Z794 Long term (current) use of insulin: Secondary | ICD-10-CM | POA: Diagnosis not present

## 2018-07-10 DIAGNOSIS — Z7901 Long term (current) use of anticoagulants: Secondary | ICD-10-CM | POA: Diagnosis not present

## 2018-07-10 DIAGNOSIS — R42 Dizziness and giddiness: Secondary | ICD-10-CM | POA: Diagnosis present

## 2018-07-10 LAB — CBC
HCT: 40.4 % (ref 36.0–46.0)
Hemoglobin: 12.6 g/dL (ref 12.0–15.0)
MCH: 26.5 pg (ref 26.0–34.0)
MCHC: 31.2 g/dL (ref 30.0–36.0)
MCV: 84.9 fL (ref 80.0–100.0)
Platelets: 433 K/uL — ABNORMAL HIGH (ref 150–400)
RBC: 4.76 MIL/uL (ref 3.87–5.11)
RDW: 17.8 % — ABNORMAL HIGH (ref 11.5–15.5)
WBC: 10.2 K/uL (ref 4.0–10.5)
nRBC: 0 % (ref 0.0–0.2)

## 2018-07-10 LAB — BASIC METABOLIC PANEL WITH GFR
Anion gap: 8 (ref 5–15)
BUN: 15 mg/dL (ref 8–23)
CO2: 31 mmol/L (ref 22–32)
Calcium: 9 mg/dL (ref 8.9–10.3)
Chloride: 97 mmol/L — ABNORMAL LOW (ref 98–111)
Creatinine, Ser: 0.68 mg/dL (ref 0.44–1.00)
GFR calc Af Amer: >60 mL/min
GFR calc non Af Amer: >60 mL/min
Glucose, Bld: 156 mg/dL — ABNORMAL HIGH (ref 70–99)
Potassium: 4.7 mmol/L (ref 3.5–5.1)
Sodium: 136 mmol/L (ref 135–145)

## 2018-07-10 LAB — URINALYSIS, COMPLETE (UACMP) WITH MICROSCOPIC
Bilirubin Urine: NEGATIVE
GLUCOSE, UA: NEGATIVE mg/dL
Ketones, ur: NEGATIVE mg/dL
Nitrite: POSITIVE — AB
Protein, ur: 30 mg/dL — AB
Specific Gravity, Urine: 1.011 (ref 1.005–1.030)
Squamous Epithelial / HPF: NONE SEEN (ref 0–5)
WBC, UA: >50 WBC/hpf — ABNORMAL HIGH (ref 0–5)
pH: 5 (ref 5.0–8.0)

## 2018-07-10 LAB — GLUCOSE, CAPILLARY: Glucose-Capillary: 130 mg/dL — ABNORMAL HIGH (ref 70–99)

## 2018-07-10 MED ORDER — NITROFURANTOIN MACROCRYSTAL 100 MG PO CAPS: 100.0000 mg | ORAL_CAPSULE | Freq: Two times a day (BID) | ORAL | 0 refills | Status: AC

## 2018-07-10 MED ORDER — SODIUM CHLORIDE 0.9 % IV BOLUS
1000.0000 mL | Freq: Once | INTRAVENOUS | Status: AC
  Administered 2018-07-10: 1000 mL via INTRAVENOUS

## 2018-07-10 MED ORDER — NITROFURANTOIN MONOHYD MACRO 100 MG PO CAPS
100.0000 mg | ORAL_CAPSULE | Freq: Once | ORAL | Status: AC
  Administered 2018-07-10: 100 mg via ORAL
  Filled 2018-07-10: qty 1

## 2018-07-10 NOTE — ED Provider Notes (Addendum)
[facility] Emergency Department Provider Note  ____________________________________________  Time seen: Approximately 2:44 PM  I have reviewed the triage vital signs and the nursing notes.   HISTORY  Chief Complaint Dizziness  Level 5 Caveat: Portions of the History and Physical including HPI and review of systems are unable to be completely obtained due to patient being a poor historian    HPI [name] is a 77 y.o. female with a history of heart failure CHF diabetes hypertension who was brought to the ED by her family due to an episode of unresponsiveness today.  She was lying in her bed, and they used a Hoyer lift with physical therapist present to put her into a chair after which she seemed to go limp and unresponsive for about 2 minutes before she regained consciousness.  No postictal phase or urinary incontinence.  She does state that she feels dizzy.  They feel that she is been eating and drinking normally.  She denies any acute pain or other complaints.      Past Medical History:  Diagnosis Date  . Acute heart failure ([place])   . Aortic stenosis   . CHF (congestive heart failure) ([place])   . Complication of anesthesia    Hard to wake patient up after having anesthesia  . Diabetes mellitus without complication ([place])   . Diabetic neuropathy (HCC)    Takes Lyrica  . Edema of both legs    Takes Lasix  . GERD (gastroesophageal reflux disease)   . High cholesterol   . HTN (hypertension)   . Hypertension   . PAD (peripheral artery disease) ([place])   . Shortness of breath dyspnea   . Spasm of back muscles   . Stroke ([place])   . Wound, open    Right great toe     Patient Active Problem List   Diagnosis Date Noted  . Healthcare-associated pneumonia 06/03/2018  . Acute cystitis 04/10/2018  . Sepsis due to Streptococcus, group B ([place])   . Multiple open wounds of lower leg   . PVD (peripheral vascular disease) ([place])   . Bacteremia 08/31/2016  .  Sepsis ([place]) 08/29/2016  . Benign essential HTN   . Acute on chronic diastolic heart failure ([place])   . Ulcer of right lower extremity ([place])   . Chest pain 07/05/2016  . Impacted cerumen of both ears 06/05/2016  . 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 Pleural effusion, bilateral   . Wheeze   . Dyspnea   . CHF (congestive heart failure) ([place])   . Right sided weakness   . Acute respiratory acidosis   . Surgery, elective   . Anemia, iron deficiency   . Leukocytosis   . Cerebrovascular accident (CVA) due to thrombosis of precerebral artery ([place]) 03/01/2016  . 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 PAD (peripheral artery disease) ([place]) 02/28/2016  . Atherosclerotic peripheral vascular disease with intermittent claudication ([place]) 12/08/2015  . Open toe wound 05/04/2014  . Anemia, chronic disease 04/27/2014  . Occult blood positive stool 04/17/2014  . Edema 04/03/2014  . Unspecified hereditary and idiopathic peripheral neuropathy 03/14/2014  . DM2 (diabetes mellitus, type 2) ([place]) 02/14/2014  . Altered mental status 02/12/2014  . Acute encephalopathy 02/12/2014  . Aortic stenosis 02/04/2014  . Closed left subtrochanteric femur fracture ([place]) 02/03/2014  . Hip fracture, left ([place]) 02/03/2014  . Hypokalemia 02/03/2014  . Fibula fracture 02/03/2014  . MTP instability 02/03/2014  . Hip  fracture ([place]) 02/03/2014  . HTN (hypertension)   . HLD (hyperlipidemia)      Past Surgical History:  Procedure Laterality Date  . BYPASS GRAFT POPLITEAL TO TIBIAL Right 02/28/2016   Procedure: BYPASS GRAFT RIGHT BELOW KNEE POPLITEAL TO PERONEAL USING REVERSED RIGHT GREATER SAPHENOUS VEIN;  Surgeon: Conrad Whatley, MD;  Location: [place];  Service: Vascular;  Laterality: Right;  . CYST EXCISION     Abdomen  . EYE SURGERY Bilateral    Cataract removal  . INTRAMEDULLARY (IM) NAIL INTERTROCHANTERIC Left 02/04/2014   Procedure: INTRAMEDULLARY (IM)  NAIL INTERTROCHANTRIC FEMORAL;  Surgeon: Mauri Pole, MD;  Location: [place];  Service: Orthopedics;  Laterality: Left;  . IR GENERIC HISTORICAL  03/01/2016   IR ANGIO INTRA EXTRACRAN SEL COM CAROTID INNOMINATE UNI R MOD SED 03/01/2016 [name], MD [facility]  . IR GENERIC HISTORICAL  03/01/2016   IR ENDOVASC INTRACRANIAL INF OTHER THAN THROMBO ART INC DIAG ANGIO 03/01/2016 [name], MD [facility]  . IR GENERIC HISTORICAL  03/01/2016   IR INTRAVSC STENT CERV CAROTID W/O EMB-PROT MOD SED INC ANGIO 03/01/2016 [name], MD [facility]  . IR GENERIC HISTORICAL  06/03/2016   IR RADIOLOGIST EVAL & MGMT 06/03/2016 [facility]  . ORIF TOE FRACTURE Right 02/08/2014   Procedure: OPEN REDUCTION INTERNAL FIXATION Right METATARSAL  FRACTURE ;  Surgeon: Wylene Simmer, MD;  Location: [place];  Service: Orthopedics;  Laterality: Right;  . PERIPHERAL VASCULAR CATHETERIZATION N/A 12/28/2015   Procedure: Abdominal Aortogram w/Lower Extremity;  Surgeon: Conrad [place], MD;  Location: [facility];  Service: Cardiovascular;  Laterality: N/A;  . RADIOLOGY WITH ANESTHESIA N/A 03/01/2016   Procedure: RADIOLOGY WITH ANESTHESIA;  Surgeon: [name], MD;  Location: [place];  Service: Radiology;  Laterality: N/A;  . VEIN HARVEST Right 02/28/2016   Procedure: RIGHT GREATER SAPHENOUS VEIN HARVEST;  Surgeon: Conrad [place], MD;  Location: [place];  Service: Vascular;  Laterality: Right;     Prior to Admission medications   Medication Sig Start Date End Date Taking? Authorizing Provider  acetaminophen (TYLENOL) 500 MG tablet Take 1,000 mg by mouth every 6 (six) hours as needed for mild pain.    [provider]  albuterol (PROVENTIL HFA;VENTOLIN HFA) 108 (90 Base) MCG/ACT inhaler Inhale 2 puffs into the lungs every 6 (six) hours as needed for wheezing or shortness of breath.     [provider]  aspirin 325 MG tablet Take 1 tablet (325 mg total) by mouth daily with  breakfast. Patient taking differently: Take 325 mg by mouth daily.  07/10/16   Holley Raring, MD  atorvastatin (LIPITOR) 20 MG tablet Take 1 tablet (20 mg total) by mouth daily. 03/13/16   Virgina Jock A, PA-C  budesonide (PULMICORT) 0.25 MG/2ML nebulizer solution Take 2 mLs (0.25 mg total) by nebulization 2 (two) times daily. 06/10/18   Dustin Flock, MD  carvedilol (COREG) 6.25 MG tablet Take 6.25 mg by mouth 2 (two) times daily.     [provider]  cetirizine (ZYRTEC) 10 MG tablet Take 10 mg by mouth daily.     [provider]  cholecalciferol (VITAMIN D) 1000 units tablet Take 2,000 Units by mouth daily.     [provider]  clopidogrel (PLAVIX) 75 MG tablet Take 1 tablet (75 mg total) by mouth daily with breakfast. 03/04/17   Conrad [place]amo, MD  docusate sodium (COLACE) 100 MG capsule Take 100 mg by mouth 2 (two) times daily.  [provider]  fluticasone (FLONASE) 50 MCG/ACT nasal spray Place 1 spray into both nostrils every evening.     [provider]  insulin glargine (LANTUS) 100 UNIT/ML injection Inject 0.27 mLs (27 Units total) into the skin at bedtime. Patient taking differently: Inject 40 Units into the skin at bedtime.  09/16/16   Reyne Dumas, MD  ipratropium-albuterol (DUONEB) 0.5-2.5 (3) MG/3ML SOLN Take 3 mLs by nebulization 2 (two) times daily. 06/10/18   Dustin Flock, MD  isosorbide dinitrate (ISORDIL) 30 MG tablet Take 30 mg by mouth daily.  04/16/17   [provider]  modafinil (PROVIGIL) 100 MG tablet Take 2 tablets (200 mg total) by mouth daily. 06/11/18   Dustin Flock, MD  nitrofurantoin (MACRODANTIN) 100 MG capsule Take 1 capsule (100 mg total) by mouth 2 (two) times daily for 7 days. 07/10/18 07/17/18  Carrie Mew, MD  nitroGLYCERIN (NITROSTAT) 0.4 MG SL tablet Place 1 tablet (0.4 mg total) under the tongue every 5 (five) minutes as needed for chest pain. 07/09/16   Holley Raring, MD  nystatin ointment  (MYCOSTATIN) Apply 1 application topically 3 (three) times daily. Apply to area below breasts    [provider]  omeprazole (PRILOSEC) 20 MG capsule Take 1 capsule (20 mg total) by mouth daily. 04/17/18   Henreitta Leber, MD  polyethylene glycol (MIRALAX / GLYCOLAX) packet Take 17 g by mouth 2 (two) times daily. 02/09/14   Thurnell Lose, MD  potassium chloride SA (K-DUR,KLOR-CON) 20 MEQ tablet Take 20 mEq by mouth daily.    [provider]  pregabalin (LYRICA) 50 MG capsule Take 50 mg by mouth 2 (two) times daily.     [provider]  torsemide (DEMADEX) 20 MG tablet Take 2 tablets (40 mg total) by mouth 2 (two) times daily. 04/17/18   Henreitta Leber, MD  VITAMIN E PO Take 1 capsule by mouth daily.    [provider]     Allergies Iodine; Penicillins; Shellfish allergy; Shellfish-derived products; Sulfa antibiotics; Sulfacetamide sodium; Sulfasalazine; and Morphine and related   Family History  Problem Relation Age of Onset  . Diabetes Other   . Liver disease Mother   . CVA Father   . Diabetes Father   . Hypertension Father     Social History Social History   Tobacco Use  . Smoking status: Never Smoker  . Smokeless tobacco: Never Used  . Tobacco comment: Never smoked  Substance Use Topics  . Alcohol use: No    Alcohol/week: 0.0 standard drinks  . Drug use: No    Review of Systems  Constitutional:   No fever or chills.  ENT:   No sore throat. No rhinorrhea. Cardiovascular:   No chest pain or syncope. Respiratory:   No dyspnea or cough. Gastrointestinal:   Negative for abdominal pain, vomiting and diarrhea.  Musculoskeletal:   Negative for focal pain or swelling All other systems reviewed and are negative except as documented above in ROS and HPI.  ____________________________________________   PHYSICAL EXAM:  VITAL SIGNS: ED Triage Vitals  Enc Vitals Group     BP 07/10/18 1227 (!) 184/62     Pulse Rate 07/10/18 1230 80      Resp 07/10/18 1227 20     Temp 07/10/18 1305 97.7 F (36.5 C)     Temp Source 07/10/18 1305 Oral     SpO2 07/10/18 1225 100 %     Weight 07/10/18 1229 220 lb 7.4 oz (100 kg)  Height 07/10/18 1229 5' (1.524 m)     Head Circumference --      Peak Flow --      Pain Score --      Pain Loc --      Pain Edu? --      Excl. in [place]? --     Vital signs reviewed, nursing assessments reviewed.   Constitutional:   Alert and oriented to person and place. Non-toxic appearance. Eyes:   Conjunctivae are normal. EOMI. PERRL. ENT      Head:   Normocephalic and atraumatic.      Nose:   No congestion/rhinnorhea.       Mouth/Throat:   Dry mucous membranes, no pharyngeal erythema. No peritonsillar mass.       Neck:   No meningismus. Full ROM. Hematological/Lymphatic/Immunilogical:   No cervical lymphadenopathy. Cardiovascular:   RRR. Symmetric bilateral radial and DP pulses.  No murmurs. Cap refill less than 2 seconds. Respiratory:   Normal respiratory effort without tachypnea/retractions. Breath sounds are clear and equal bilaterally. No wheezes/rales/rhonchi. Gastrointestinal:   Soft and nontender. Non distended. There is no CVA tenderness.  No rebound, rigidity, or guarding.  Musculoskeletal:   Normal range of motion in all extremities. No joint effusions.  No lower extremity tenderness.  No edema. Neurologic:   Normal speech and language.  Motor grossly intact, symmetric, somewhat effort limited. No acute focal neurologic deficits are appreciated.  Skin:    Skin is warm, dry with stage II decubitus ulcer about 1 cm back on both the skin overlying the left SI joint and the skin overlying the right SI joint.  No inflammatory or infectious changes. ____________________________________________    LABS (pertinent positives/negatives) (all labs ordered are listed, but only abnormal results are displayed) Labs Reviewed  BASIC METABOLIC PANEL - Abnormal; Notable for the following components:       Result Value   Chloride 97 (*)    Glucose, Bld 156 (*)    All other components within normal limits  CBC - Abnormal; Notable for the following components:   RDW 17.8 (*)    Platelets 433 (*)    All other components within normal limits  URINALYSIS, COMPLETE (UACMP) WITH MICROSCOPIC - Abnormal; Notable for the following components:   Color, Urine YELLOW (*)    APPearance TURBID (*)    Hgb urine dipstick SMALL (*)    Protein, ur 30 (*)    Nitrite POSITIVE (*)    Leukocytes, UA LARGE (*)    WBC, UA >50 (*)    Bacteria, UA FEW (*)    All other components within normal limits  GLUCOSE, CAPILLARY - Abnormal; Notable for the following components:   Glucose-Capillary 130 (*)    All other components within normal limits  URINE CULTURE  CBG MONITORING, ED   ____________________________________________   EKG  Interpreted by me Sinus rhythm rate of 81, normal axis and intervals.  Normal QRS and ST segments.  Isolated T wave inversion in lead III which is nonspecific.  ____________________________________________    RADIOLOGY  Dg Chest Portable 1 View  Result Date: 07/10/2018 CLINICAL DATA:  Altered mental status and weakness. EXAM: PORTABLE CHEST 1 VIEW COMPARISON:  06/19/2018, 06/18/2018 and CT from 06/03/2018 FINDINGS: Stable cardiac enlargement. Persistent densities at both lung bases most compatible with mild consolidation and volume loss. Negative for pulmonary edema. Patient is mildly rotated towards the right. Negative for pneumothorax. No acute bone abnormality. IMPRESSION: 1. Persistent bibasilar chest densities. Findings are compatible with  volume loss and mild consolidation. Cannot exclude small pleural effusions. 2. Cardiomegaly without pulmonary edema. Electronically Signed   By: Markus [place] M.D.   On: 07/10/2018 14:21    ____________________________________________   PROCEDURES Procedures  ____________________________________________  DIFFERENTIAL DIAGNOSIS    Pneumonia, urinary tract infection, dehydration, electrolyte disturbance, intracranial hemorrhage  CLINICAL IMPRESSION / ASSESSMENT AND PLAN / ED COURSE  Pertinent labs & imaging results that were available during my care of the patient were reviewed by me and considered in my medical decision making (see chart for details).    Patient presents with generalized weakness, episode of what is most likely syncope.  No vascular or cardiac or neurologic symptoms to suggest that this is ACS PE dissection stroke.  She is not septic.  She does appear to be somewhat dehydrated on exam otherwise has no focal symptoms or findings.  Serum labs are unremarkable.  Urinalysis shows a clear nitrate positive UTI.  Due to her allergy profile, I will start her on Macrobid and send a urine culture.  Recommend close follow-up with primary care.  Chest x-ray is unremarkable.  We will follow-up CT head, if no severe findings patient can be discharged home to follow-up with primary care.      ____________________________________________   FINAL CLINICAL IMPRESSION(S) / ED DIAGNOSES    Final diagnoses:  Generalized weakness  Cystitis     ED Discharge Orders         Ordered    nitrofurantoin (MACRODANTIN) 100 MG capsule  2 times daily     07/10/18 1443          Portions of this note were generated with dragon dictation software. Dictation errors may occur despite best attempts at proofreading.    Carrie Mew, MD 07/10/18 1448    Carrie Mew, MD 07/10/18 [phone]

## 2018-07-10 NOTE — ED Notes (Signed)
Assisted to EMS stretcher by this RN.

## 2018-07-10 NOTE — ED Notes (Signed)
md attempted ultrasound iv but unable to obtain access. Blue, green, purple lab tubes obtained at this time

## 2018-07-10 NOTE — ED Notes (Signed)
Pt had episode of bowel and bladder incontinence. Pt cleaned and repositioned in bed at this time

## 2018-07-10 NOTE — ED Triage Notes (Signed)
Pt arrives via ems from home with reports of genralized pain, and altered mental status. Ems states son reported pt "zoned out" a couple times this morning after physical therapy. Son reported to ems that pt sometimes pt get tired after physical therapy. Pt complaining of feeling lightheaded, but denies that any visual changes such as room spinning when eyes are open. Pt alert and oriented to self on arrival. Unable to state what month it is at this time, able to states she is in hospital but unable to state situation. No acute distress noted at this time. Pt has been bed bound for several months. Ems reported 2 pressure ulcers.

## 2018-07-10 NOTE — ED Notes (Signed)
Iv team at bedside at this time 

## 2018-07-10 NOTE — Discharge Instructions (Signed)
Your tests today show that you have a urinary tract infection.  Take Macrobid as prescribed to treat this and follow-up with your doctor on Monday for to continue monitoring your symptoms.  Return to the emergency room if you have worsening symptoms including fever, inability to eat or drink, passing out, or other concerns.

## 2018-07-12 LAB — URINE CULTURE

## 2018-07-18 ENCOUNTER — Emergency Department (HOSPITAL_COMMUNITY)
Admission: EM | Admit: 2018-07-18 | Discharge: 2018-07-18 | Disposition: A | Discharge status: Home / Self Care | Payer: Medicare HMO | Attending: Emergency Medicine | Admitting: Emergency Medicine

## 2018-07-18 ENCOUNTER — Emergency Department (HOSPITAL_COMMUNITY): Payer: Medicare HMO

## 2018-07-18 ENCOUNTER — Encounter (HOSPITAL_COMMUNITY): Payer: Self-pay | Admitting: Emergency Medicine

## 2018-07-18 DIAGNOSIS — Z7902 Long term (current) use of antithrombotics/antiplatelets: Secondary | ICD-10-CM | POA: Insufficient documentation

## 2018-07-18 DIAGNOSIS — Z794 Long term (current) use of insulin: Secondary | ICD-10-CM | POA: Insufficient documentation

## 2018-07-18 DIAGNOSIS — I5032 Chronic diastolic (congestive) heart failure: Secondary | ICD-10-CM | POA: Diagnosis not present

## 2018-07-18 DIAGNOSIS — Z7982 Long term (current) use of aspirin: Secondary | ICD-10-CM | POA: Diagnosis not present

## 2018-07-18 DIAGNOSIS — Z79899 Other long term (current) drug therapy: Secondary | ICD-10-CM | POA: Insufficient documentation

## 2018-07-18 DIAGNOSIS — I110 Hypertensive heart disease with heart failure: Secondary | ICD-10-CM | POA: Diagnosis not present

## 2018-07-18 DIAGNOSIS — R4182 Altered mental status, unspecified: Secondary | ICD-10-CM | POA: Diagnosis not present

## 2018-07-18 DIAGNOSIS — E119 Type 2 diabetes mellitus without complications: Secondary | ICD-10-CM | POA: Insufficient documentation

## 2018-07-18 LAB — URINALYSIS, ROUTINE W REFLEX MICROSCOPIC
BACTERIA UA: NONE SEEN
Bilirubin Urine: NEGATIVE
Glucose, UA: NEGATIVE mg/dL
Hgb urine dipstick: NEGATIVE
Ketones, ur: 5 mg/dL — AB
NITRITE: NEGATIVE
Protein, ur: NEGATIVE mg/dL
Specific Gravity, Urine: 1.011 (ref 1.005–1.030)
pH: 5 (ref 5.0–8.0)

## 2018-07-18 LAB — CBC WITH DIFFERENTIAL/PLATELET
Abs Immature Granulocytes: 0.05 10*3/uL (ref 0.00–0.07)
Basophils Absolute: 0.1 10*3/uL (ref 0.0–0.1)
Basophils Relative: 1 %
Eosinophils Absolute: 0.2 10*3/uL (ref 0.0–0.5)
Eosinophils Relative: 2 %
HCT: 42.1 % (ref 36.0–46.0)
HEMOGLOBIN: 12.7 g/dL (ref 12.0–15.0)
Immature Granulocytes: 1 %
Lymphocytes Relative: 18 %
Lymphs Abs: 1.9 10*3/uL (ref 0.7–4.0)
MCH: 25.7 pg — ABNORMAL LOW (ref 26.0–34.0)
MCHC: 30.2 g/dL (ref 30.0–36.0)
MCV: 85.2 fL (ref 80.0–100.0)
MONO ABS: 1 10*3/uL (ref 0.1–1.0)
Monocytes Relative: 10 %
Neutro Abs: 7.5 10*3/uL (ref 1.7–7.7)
Neutrophils Relative %: 68 %
Platelets: 295 10*3/uL (ref 150–400)
RBC: 4.94 MIL/uL (ref 3.87–5.11)
RDW: 18.7 % — ABNORMAL HIGH (ref 11.5–15.5)
WBC: 10.7 10*3/uL — ABNORMAL HIGH (ref 4.0–10.5)
nRBC: 0 % (ref 0.0–0.2)

## 2018-07-18 LAB — COMPREHENSIVE METABOLIC PANEL
ALK PHOS: 112 U/L (ref 38–126)
ALT: 15 U/L (ref 0–44)
AST: 20 U/L (ref 15–41)
Albumin: 2.5 g/dL — ABNORMAL LOW (ref 3.5–5.0)
Anion gap: 7 (ref 5–15)
BUN: 13 mg/dL (ref 8–23)
CALCIUM: 8.9 mg/dL (ref 8.9–10.3)
CO2: 31 mmol/L (ref 22–32)
Chloride: 97 mmol/L — ABNORMAL LOW (ref 98–111)
Creatinine, Ser: 0.5 mg/dL (ref 0.44–1.00)
GFR calc Af Amer: >60 mL/min (ref >60)
GFR calc non Af Amer: >60 mL/min (ref >60)
Glucose, Bld: 91 mg/dL (ref 70–99)
Potassium: 4.4 mmol/L (ref 3.5–5.1)
Sodium: 135 mmol/L (ref 135–145)
Total Bilirubin: 0.7 mg/dL (ref 0.3–1.2)
Total Protein: 6.4 g/dL — ABNORMAL LOW (ref 6.5–8.1)

## 2018-07-18 MED ORDER — CIPROFLOXACIN HCL 250 MG PO TABS
500.0000 mg | ORAL_TABLET | Freq: Once | ORAL | Status: AC
  Administered 2018-07-18: 500 mg via ORAL
  Filled 2018-07-18: qty 2

## 2018-07-18 MED ORDER — CIPROFLOXACIN HCL 500 MG PO TABS: 500.0000 mg | ORAL_TABLET | Freq: Two times a day (BID) | ORAL | 0 refills | Status: DC

## 2018-07-18 MED ORDER — SODIUM CHLORIDE 0.9 % IV SOLN: INTRAVENOUS | Status: DC

## 2018-07-18 NOTE — ED Triage Notes (Signed)
Pt brought in by ems for decreased loc since last night.  Recently treated for UTI with macrobid.  No family available for additional info at this time.

## 2018-07-18 NOTE — ED Provider Notes (Signed)
[facility] EMERGENCY DEPARTMENT Provider Note   CSN: [id number] Arrival date & time: 07/18/18  1032     History   Chief Complaint Chief Complaint  Patient presents with  . Altered Mental Status    HPI [name] is a 77 y.o. female.  Patient brought in by [facility] from [facility].  Patient normally followed at the scat clinic in [place] area and usually seen at the [facility].  Family called out for some decreased decreased mental status since last night seem more prevalent today where she would drift in and out.  Patient was seen [facility] emergency department December 6 work-up for very similar presentation with finding of urinary tract infection and treated with Macrobid.  Family states that she seemed to improve on that but then today she seems to be back to where she was.  They deny any nausea or vomiting she has had some loose bowel movements but no real diarrhea.  She completed the course of antibiotics yesterday.  Patient is normally on BiPAP at night.  And is always on 2 L of oxygen at all times.  Patient's been bedridden for the past 3 months has been nonambulatory.     Past Medical History:  Diagnosis Date  . Acute heart failure ([place])   . Aortic stenosis   . CHF (congestive heart failure) ([place])   . Complication of anesthesia    Hard to wake patient up after having anesthesia  . Diabetes mellitus without complication ([place])   . Diabetic neuropathy (HCC)    Takes Lyrica  . Edema of both legs    Takes Lasix  . GERD (gastroesophageal reflux disease)   . High cholesterol   . HTN (hypertension)   . Hypertension   . PAD (peripheral artery disease) ([place])   . Shortness of breath dyspnea   . Spasm of back muscles   . Stroke ([place])   . Wound, open    Right great toe    Patient Active Problem List   Diagnosis Date Noted  . Healthcare-associated pneumonia 06/03/2018  . Acute cystitis 04/10/2018  . Sepsis due to Streptococcus, group B ([place])   .  Multiple open wounds of lower leg   . PVD (peripheral vascular disease) ([place])   . Bacteremia 08/31/2016  . Sepsis ([place]) 08/29/2016  . Benign essential HTN   . Acute on chronic diastolic heart failure ([place])   . Ulcer of right lower extremity ([place])   . Chest pain 07/05/2016  . Impacted cerumen of both ears 06/05/2016  . 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 Pleural effusion, bilateral   . Wheeze   . Dyspnea   . CHF (congestive heart failure) ([place])   . Right sided weakness   . Acute respiratory acidosis   . Surgery, elective   . Anemia, iron deficiency   . Leukocytosis   . Cerebrovascular accident (CVA) due to thrombosis of precerebral artery ([place]) 03/01/2016  . 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 PAD (peripheral artery disease) ([place]) 02/28/2016  . Atherosclerotic peripheral vascular disease with intermittent claudication ([place]) 12/08/2015  . Open toe wound 05/04/2014  . Anemia, chronic disease 04/27/2014  . Occult blood positive stool 04/17/2014  . Edema 04/03/2014  . Unspecified hereditary and idiopathic peripheral neuropathy 03/14/2014  . DM2 (diabetes mellitus, type 2) ([place]) 02/14/2014  . Altered mental status 02/12/2014  . Acute encephalopathy 02/12/2014  . Aortic stenosis 02/04/2014  . Closed left subtrochanteric  femur fracture ([place]) 02/03/2014  . Hip fracture, left ([place]) 02/03/2014  . Hypokalemia 02/03/2014  . Fibula fracture 02/03/2014  . MTP instability 02/03/2014  . Hip fracture ([place]) 02/03/2014  . HTN (hypertension)   . HLD (hyperlipidemia)     Past Surgical History:  Procedure Laterality Date  . BYPASS GRAFT POPLITEAL TO TIBIAL Right 02/28/2016   Procedure: BYPASS GRAFT RIGHT BELOW KNEE POPLITEAL TO PERONEAL USING REVERSED RIGHT GREATER SAPHENOUS VEIN;  Surgeon: Conrad P[place]allup, MD;  Location: [place];  Service: Vascular;  Laterality: Right;  . CYST EXCISION     Abdomen  . EYE SURGERY Bilateral     Cataract removal  . INTRAMEDULLARY (IM) NAIL INTERTROCHANTERIC Left 02/04/2014   Procedure: INTRAMEDULLARY (IM) NAIL INTERTROCHANTRIC FEMORAL;  Surgeon: Mauri Pole, MD;  Location: [place];  Service: Orthopedics;  Laterality: Left;  . IR GENERIC HISTORICAL  03/01/2016   IR ANGIO INTRA EXTRACRAN SEL COM CAROTID INNOMINATE UNI R MOD SED 03/01/2016 [name], MD [facility]  . IR GENERIC HISTORICAL  03/01/2016   IR ENDOVASC INTRACRANIAL INF OTHER THAN THROMBO ART INC DIAG ANGIO 03/01/2016 [name], MD [facility]  . IR GENERIC HISTORICAL  03/01/2016   IR INTRAVSC STENT CERV CAROTID W/O EMB-PROT MOD SED INC ANGIO 03/01/2016 [name], MD [facility]  . IR GENERIC HISTORICAL  06/03/2016   IR RADIOLOGIST EVAL & MGMT 06/03/2016 [facility]  . ORIF TOE FRACTURE Right 02/08/2014   Procedure: OPEN REDUCTION INTERNAL FIXATION Right METATARSAL  FRACTURE ;  Surgeon: Wylene Simmer, MD;  Location: [place];  Service: Orthopedics;  Laterality: Right;  . PERIPHERAL VASCULAR CATHETERIZATION N/A 12/28/2015   Procedure: Abdominal Aortogram w/Lower Extremity;  Surgeon: Conrad Angus, MD;  Location: [place] [facility];  Service: Cardiovascular;  Laterality: N/A;  . RADIOLOGY WITH ANESTHESIA N/A 03/01/2016   Procedure: RADIOLOGY WITH ANESTHESIA;  Surgeon: [name], MD;  Location: [place];  Service: Radiology;  Laterality: N/A;  . VEIN HARVEST Right 02/28/2016   Procedure: RIGHT GREATER SAPHENOUS VEIN HARVEST;  Surgeon: Conrad [place] Club, MD;  Location: [facility];  Service: Vascular;  Laterality: Right;     OB History    Gravida  6   Para  2   Term  2   Preterm      AB  4   Living  2     SAB  4   TAB      Ectopic      Multiple      Live Births               Home Medications    Prior to Admission medications   Medication Sig Start Date End Date Taking? Authorizing Provider  acetaminophen (TYLENOL) 500 MG tablet Take 1,000 mg by mouth every 6 (six) hours as needed for mild  pain.   Yes [provider]  albuterol (PROVENTIL HFA;VENTOLIN HFA) 108 (90 Base) MCG/ACT inhaler Inhale 2 puffs into the lungs every 6 (six) hours as needed for wheezing or shortness of breath.    Yes [provider]  aspirin 325 MG tablet Take 1 tablet (325 mg total) by mouth daily with breakfast. Patient taking differently: Take 325 mg by mouth daily.  07/10/16  Yes Holley Raring, MD  atorvastatin (LIPITOR) 20 MG tablet Take 1 tablet (20 mg total) by mouth daily. 03/13/16  Yes Virgina Jock A, PA-C  budesonide (PULMICORT) 0.25 MG/2ML nebulizer solution Take 2 mLs (0.25 mg total) by nebulization 2 (two) times  daily. 06/10/18  Yes Dustin Flock, MD  carvedilol (COREG) 6.25 MG tablet Take 6.25 mg by mouth 2 (two) times daily.    Yes [provider]  cetirizine (ZYRTEC) 10 MG tablet Take 10 mg by mouth daily.    Yes [provider]  cholecalciferol (VITAMIN D) 1000 units tablet Take 2,000 Units by mouth daily.    Yes [provider]  clopidogrel (PLAVIX) 75 MG tablet Take 1 tablet (75 mg total) by mouth daily with breakfast. 03/04/17  Yes Conrad Fossil, MD  docusate sodium (COLACE) 100 MG capsule Take 100 mg by mouth 2 (two) times daily.   Yes [provider]  fluticasone (FLONASE) 50 MCG/ACT nasal spray Place 1 spray into both nostrils every evening.    Yes [provider]  insulin glargine (LANTUS) 100 UNIT/ML injection Inject 0.27 mLs (27 Units total) into the skin at bedtime. 09/16/16  Yes Reyne Dumas, MD  ipratropium-albuterol (DUONEB) 0.5-2.5 (3) MG/3ML SOLN Take 3 mLs by nebulization 2 (two) times daily. 06/10/18  Yes Dustin Flock, MD  isosorbide dinitrate (ISORDIL) 30 MG tablet Take 30 mg by mouth daily.  04/16/17  Yes [provider]  modafinil (PROVIGIL) 100 MG tablet Take 2 tablets (200 mg total) by mouth daily. 06/11/18  Yes Dustin Flock, MD  nitrofurantoin, macrocrystal-monohydrate, (MACROBID) 100 MG capsule Take  100 mg by mouth 2 (two) times daily.   Yes [provider]  nitroGLYCERIN (NITROSTAT) 0.4 MG SL tablet Place 1 tablet (0.4 mg total) under the tongue every 5 (five) minutes as needed for chest pain. 07/09/16  Yes Holley Raring, MD  nystatin ointment (MYCOSTATIN) Apply 1 application topically 3 (three) times daily. Apply to area below breasts   Yes [provider]  omeprazole (PRILOSEC) 20 MG capsule Take 1 capsule (20 mg total) by mouth daily. 04/17/18  Yes Sainani, Belia Heman, MD  polyethylene glycol (MIRALAX / GLYCOLAX) packet Take 17 g by mouth 2 (two) times daily. 02/09/14  Yes Thurnell Lose, MD  potassium chloride SA (K-DUR,KLOR-CON) 20 MEQ tablet Take 20 mEq by mouth daily.   Yes [provider]  pregabalin (LYRICA) 50 MG capsule Take 50 mg by mouth daily.    Yes [provider]  torsemide (DEMADEX) 20 MG tablet Take 2 tablets (40 mg total) by mouth 2 (two) times daily. 04/17/18  Yes Sainani, Belia Heman, MD  vitamin E 400 UNIT capsule Take 1 capsule by mouth daily.    Yes [provider]  amLODipine (NORVASC) 10 MG tablet Take 10 mg by mouth daily.    [provider]  ciprofloxacin (CIPRO) 500 MG tablet Take 1 tablet (500 mg total) by mouth 2 (two) times daily. 07/18/18   Fredia Sorrow, MD    Family History Family History  Problem Relation Age of Onset  . Diabetes Other   . Liver disease Mother   . CVA Father   . Diabetes Father   . Hypertension Father     Social History Social History   Tobacco Use  . Smoking status: Never Smoker  . Smokeless tobacco: Never Used  . Tobacco comment: Never smoked  Substance Use Topics  . Alcohol use: No    Alcohol/week: 0.0 standard drinks  . Drug use: No     Allergies   Iodine; Penicillins; Shellfish allergy; Shellfish-derived products; Sulfa antibiotics; Sulfacetamide sodium; Sulfasalazine; and Morphine and related   Review of Systems Review of Systems  Unable to perform ROS: Mental  status change  Constitutional:  Negative for fever.  Respiratory: Negative for shortness of breath.   Gastrointestinal: Negative for abdominal pain, diarrhea, nausea and vomiting.  Neurological: Positive for weakness.  Psychiatric/Behavioral: Positive for confusion.     Physical Exam Updated Vital Signs BP (!) 162/83   Pulse (!) 114   Temp 99.8 F (37.7 C) (Rectal)   Resp (!) 31   Ht 1.524 m (5')   Wt 100 kg   SpO2 91%   BMI 43.06 kg/m   Physical Exam Vitals signs and nursing note reviewed.  Constitutional:      General: She is not in acute distress. HENT:     Head: Normocephalic and atraumatic.     Mouth/Throat:     Mouth: Mucous membranes are moist.  Eyes:     Extraocular Movements: Extraocular movements intact.     Conjunctiva/sclera: Conjunctivae normal.     Pupils: Pupils are equal, round, and reactive to light.  Neck:     Musculoskeletal: Normal range of motion and neck supple.  Cardiovascular:     Rate and Rhythm: Regular rhythm. Tachycardia present.  Pulmonary:     Effort: Pulmonary effort is normal. No respiratory distress.     Breath sounds: Normal breath sounds. No wheezing or rales.  Abdominal:     General: Bowel sounds are normal.     Palpations: Abdomen is soft.     Tenderness: There is no abdominal tenderness.  Musculoskeletal: Normal range of motion.        General: Swelling present.  Skin:    General: Skin is warm.     Capillary Refill: Capillary refill takes less than 2 seconds.     Findings: No rash.  Neurological:     Mental Status: She is alert.     Comments: Patient's alert.  Will move upper extremities.  Minimal lower extremity movement.  Patient has been bedridden for about 3 months.  No longer ambulates.      ED Treatments / Results  Labs (all labs ordered are listed, but only abnormal results are displayed) Labs Reviewed  URINALYSIS, ROUTINE W REFLEX MICROSCOPIC - Abnormal; Notable for the following components:      Result Value     APPearance HAZY (*)    Ketones, ur 5 (*)    Leukocytes, UA MODERATE (*)    All other components within normal limits  CBC WITH DIFFERENTIAL/PLATELET - Abnormal; Notable for the following components:   WBC 10.7 (*)    MCH 25.7 (*)    RDW 18.7 (*)    All other components within normal limits  COMPREHENSIVE METABOLIC PANEL - Abnormal; Notable for the following components:   Chloride 97 (*)    Total Protein 6.4 (*)    Albumin 2.5 (*)    All other components within normal limits  URINE CULTURE  BLOOD GAS, ARTERIAL    EKG None  Radiology Dg Chest 2 View  Result Date: 07/18/2018 CLINICAL DATA:  77 year old female with decreased level of consciousness. Recent UTI. EXAM: CHEST - 2 VIEW COMPARISON:  Prior chest x-ray 07/10/2018 FINDINGS: Stable cardiomegaly. Mild pulmonary vascular congestion with perhaps trace edema. Small bilateral pleural effusions. Low inspiratory volumes. No acute osseous abnormality. IMPRESSION: Mild CHF. Small bilateral layering pleural effusions. Electronically Signed   By: Jacqulynn Cadet M.D.   On: 07/18/2018 12:23   Ct Head Wo Contrast  Result Date: 07/18/2018 CLINICAL DATA:  Decreased level of consciousness since last night, recently treated UTI EXAM: CT HEAD WITHOUT CONTRAST TECHNIQUE: Contiguous axial images were obtained  from the base of the skull through the vertex without intravenous contrast. Sagittal and coronal MPR images reconstructed from axial data set. COMPARISON:  07/10/2018 FINDINGS: Brain: Generalized atrophy. Normal ventricular morphology. No midline shift or mass effect. Small vessel chronic ischemic changes of deep cerebral white matter. Calcified extra-axial mass at floor of RIGHT posterior fossa, 13 x 7 x 13 mm likely calcified meningioma, unchanged no intracranial hemorrhage, additional mass lesion, evidence of acute infarction, or extra-axial fluid collection. Vascular: Atherosclerotic calcification of internal carotid arteries bilaterally  at skull base. Skull: Intact Sinuses/Orbits: Clear Other: N/A IMPRESSION: Atrophy with small vessel chronic ischemic changes of deep cerebral white matter. 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 No acute intracranial abnormalities. Electronically Signed   By: Lavonia Dana M.D.   On: 07/18/2018 12:16    Procedures Procedures (including critical care time)  Medications Ordered in ED Medications  0.9 %  sodium chloride infusion (has no administration in time range)  ciprofloxacin (CIPRO) tablet 500 mg (has no administration in time range)     Initial Impression / Assessment and Plan / ED Course  I have reviewed the triage vital signs and the nursing notes.  Pertinent labs & imaging results that were available during my care of the patient were reviewed by me and considered in my medical decision making (see chart for details).     Patient with presentation very similar to what she was seen at [facility] emergency department on December 6.  Some waxing and waning altered mental status.  At that time they determined she had probable urinary tract infection was treated with Macrodantin.  Family seem to imply that she did improve on this.  But then here today she seemed to be confused again.  They were concerned about recurrent urinary tract infection.  However urinalysis does have some leukocytosis but does not have any significant bacteria and nitrite is negative.  Rest of work-up for altered mental status CT had chest x-ray labs without significant abnormalities.  They mentioned on chest x-ray there may be some mild CHF.  But this is somewhat questionable.  Patient is normally on oxygen nasal cannula at all times.  Her oxygen saturations on that here are fine.  Blood pressure is slightly elevated respiratory rate in general is been 18 range.  Patient's initial heart rate was slightly tachycardic at 114.  White blood cell count remained at the 10,000 level.  Family talks about  her using BiPAP at times at home.  Had considered checking arterial blood gas here respiratory unable to get a blood gas.  We had some difficulty just even getting venous blood from her earlier.  Patient's oxygen saturations have been in the upper 90s on her normal 2 L of oxygen.  Cardiac monitor with heart rates in the 80s.  Regular.  Consistent with a sinus type rhythm.  In addition patient CO2 on her metabolic panel was slightly elevated in the 30s suggesting that she does have some CO2 retention but she does not seem to be significantly somnolent here.  Discussion with family patient seems fairly alert hears not really L that somnolent.  I think patient can be discharged home we will go ahead and give a trial of Cipro as the antibiotic since patient does have some white blood cells in the urine while were waiting for the urine culture.  Patient has a penicillin allergy so cephalosporins or would not be appropriate.  Family agrees patient overall seems to have some improvement  here more alert.  They will return for any new or worse symptoms.  To follow-up with her doctors next week.  Final Clinical Impressions(s) / ED Diagnoses   Final diagnoses:  Altered mental status, unspecified altered mental status type    ED Discharge Orders         Ordered    ciprofloxacin (CIPRO) 500 MG tablet  2 times daily     07/18/18 1450           Fredia Sorrow, MD 07/18/18 1458

## 2018-07-18 NOTE — Progress Notes (Signed)
Unable to obtain ABG. RN and MD made aware.

## 2018-07-19 LAB — URINE CULTURE: Culture: <10000 — AB

## 2018-12-23 ENCOUNTER — Other Ambulatory Visit: Payer: Self-pay

## 2018-12-23 ENCOUNTER — Emergency Department: Payer: Medicare HMO

## 2018-12-23 ENCOUNTER — Inpatient Hospital Stay
Admission: EM | Admit: 2018-12-23 | Discharge: 2018-12-28 | DRG: 871 | Disposition: A | Discharge status: Home Health | Payer: Medicare HMO | Attending: Internal Medicine | Admitting: Internal Medicine

## 2018-12-23 ENCOUNTER — Encounter: Payer: Self-pay | Admitting: Intensive Care

## 2018-12-23 ENCOUNTER — Inpatient Hospital Stay: Payer: Medicare HMO

## 2018-12-23 DIAGNOSIS — Z885 Allergy status to narcotic agent status: Secondary | ICD-10-CM

## 2018-12-23 DIAGNOSIS — Z888 Allergy status to other drugs, medicaments and biological substances status: Secondary | ICD-10-CM

## 2018-12-23 DIAGNOSIS — A4151 Sepsis due to Escherichia coli [E. coli]: Principal | ICD-10-CM | POA: Diagnosis present

## 2018-12-23 DIAGNOSIS — E7800 Pure hypercholesterolemia, unspecified: Secondary | ICD-10-CM | POA: Diagnosis present

## 2018-12-23 DIAGNOSIS — E1140 Type 2 diabetes mellitus with diabetic neuropathy, unspecified: Secondary | ICD-10-CM | POA: Diagnosis present

## 2018-12-23 DIAGNOSIS — E785 Hyperlipidemia, unspecified: Secondary | ICD-10-CM | POA: Diagnosis present

## 2018-12-23 DIAGNOSIS — Z794 Long term (current) use of insulin: Secondary | ICD-10-CM

## 2018-12-23 DIAGNOSIS — Z91013 Allergy to seafood: Secondary | ICD-10-CM | POA: Diagnosis not present

## 2018-12-23 DIAGNOSIS — I5032 Chronic diastolic (congestive) heart failure: Secondary | ICD-10-CM | POA: Diagnosis present

## 2018-12-23 DIAGNOSIS — Z880 Allergy status to penicillin: Secondary | ICD-10-CM

## 2018-12-23 DIAGNOSIS — N390 Urinary tract infection, site not specified: Secondary | ICD-10-CM | POA: Diagnosis not present

## 2018-12-23 DIAGNOSIS — Z833 Family history of diabetes mellitus: Secondary | ICD-10-CM | POA: Diagnosis not present

## 2018-12-23 DIAGNOSIS — Z823 Family history of stroke: Secondary | ICD-10-CM

## 2018-12-23 DIAGNOSIS — I161 Hypertensive emergency: Secondary | ICD-10-CM | POA: Diagnosis present

## 2018-12-23 DIAGNOSIS — A419 Sepsis, unspecified organism: Secondary | ICD-10-CM | POA: Diagnosis not present

## 2018-12-23 DIAGNOSIS — H903 Sensorineural hearing loss, bilateral: Secondary | ICD-10-CM | POA: Diagnosis present

## 2018-12-23 DIAGNOSIS — R6510 Systemic inflammatory response syndrome (SIRS) of non-infectious origin without acute organ dysfunction: Secondary | ICD-10-CM

## 2018-12-23 DIAGNOSIS — Z8249 Family history of ischemic heart disease and other diseases of the circulatory system: Secondary | ICD-10-CM

## 2018-12-23 DIAGNOSIS — D329 Benign neoplasm of meninges, unspecified: Secondary | ICD-10-CM | POA: Diagnosis present

## 2018-12-23 DIAGNOSIS — Z7982 Long term (current) use of aspirin: Secondary | ICD-10-CM | POA: Diagnosis not present

## 2018-12-23 DIAGNOSIS — E1151 Type 2 diabetes mellitus with diabetic peripheral angiopathy without gangrene: Secondary | ICD-10-CM | POA: Diagnosis present

## 2018-12-23 DIAGNOSIS — Z7189 Other specified counseling: Secondary | ICD-10-CM | POA: Diagnosis not present

## 2018-12-23 DIAGNOSIS — Z1612 Extended spectrum beta lactamase (ESBL) resistance: Secondary | ICD-10-CM | POA: Diagnosis present

## 2018-12-23 DIAGNOSIS — I10 Essential (primary) hypertension: Secondary | ICD-10-CM | POA: Diagnosis not present

## 2018-12-23 DIAGNOSIS — I248 Other forms of acute ischemic heart disease: Secondary | ICD-10-CM | POA: Diagnosis present

## 2018-12-23 DIAGNOSIS — Z1159 Encounter for screening for other viral diseases: Secondary | ICD-10-CM | POA: Diagnosis not present

## 2018-12-23 DIAGNOSIS — Z7902 Long term (current) use of antithrombotics/antiplatelets: Secondary | ICD-10-CM

## 2018-12-23 DIAGNOSIS — R778 Other specified abnormalities of plasma proteins: Secondary | ICD-10-CM

## 2018-12-23 DIAGNOSIS — K529 Noninfective gastroenteritis and colitis, unspecified: Secondary | ICD-10-CM | POA: Diagnosis present

## 2018-12-23 DIAGNOSIS — R4182 Altered mental status, unspecified: Secondary | ICD-10-CM

## 2018-12-23 DIAGNOSIS — N3001 Acute cystitis with hematuria: Secondary | ICD-10-CM | POA: Diagnosis present

## 2018-12-23 DIAGNOSIS — G9341 Metabolic encephalopathy: Secondary | ICD-10-CM | POA: Diagnosis present

## 2018-12-23 DIAGNOSIS — K219 Gastro-esophageal reflux disease without esophagitis: Secondary | ICD-10-CM | POA: Diagnosis present

## 2018-12-23 DIAGNOSIS — R7989 Other specified abnormal findings of blood chemistry: Secondary | ICD-10-CM | POA: Diagnosis not present

## 2018-12-23 DIAGNOSIS — Z515 Encounter for palliative care: Secondary | ICD-10-CM | POA: Diagnosis not present

## 2018-12-23 DIAGNOSIS — Z79899 Other long term (current) drug therapy: Secondary | ICD-10-CM

## 2018-12-23 DIAGNOSIS — Z7951 Long term (current) use of inhaled steroids: Secondary | ICD-10-CM

## 2018-12-23 DIAGNOSIS — I110 Hypertensive heart disease with heart failure: Secondary | ICD-10-CM | POA: Diagnosis present

## 2018-12-23 DIAGNOSIS — A499 Bacterial infection, unspecified: Secondary | ICD-10-CM

## 2018-12-23 DIAGNOSIS — Z8673 Personal history of transient ischemic attack (TIA), and cerebral infarction without residual deficits: Secondary | ICD-10-CM | POA: Diagnosis not present

## 2018-12-23 LAB — URINALYSIS, COMPLETE (UACMP) WITH MICROSCOPIC
Bilirubin Urine: NEGATIVE
Glucose, UA: >=500 mg/dL — AB
Ketones, ur: 20 mg/dL — AB
Nitrite: POSITIVE — AB
Protein, ur: 100 mg/dL — AB
RBC / HPF: >50 RBC/hpf — ABNORMAL HIGH (ref 0–5)
Specific Gravity, Urine: 1.012 (ref 1.005–1.030)
Squamous Epithelial / HPF: NONE SEEN (ref 0–5)
WBC, UA: >50 WBC/hpf — ABNORMAL HIGH (ref 0–5)
pH: 6 (ref 5.0–8.0)

## 2018-12-23 LAB — GLUCOSE, CAPILLARY
Glucose-Capillary: 227 mg/dL — ABNORMAL HIGH (ref 70–99)
Glucose-Capillary: 250 mg/dL — ABNORMAL HIGH (ref 70–99)
Glucose-Capillary: 256 mg/dL — ABNORMAL HIGH (ref 70–99)
Glucose-Capillary: 257 mg/dL — ABNORMAL HIGH (ref 70–99)
Glucose-Capillary: 261 mg/dL — ABNORMAL HIGH (ref 70–99)
Glucose-Capillary: 179 mg/dL — ABNORMAL HIGH (ref 70–99)

## 2018-12-23 LAB — CBC WITH DIFFERENTIAL/PLATELET
Abs Immature Granulocytes: 0.09 10*3/uL — ABNORMAL HIGH (ref 0.00–0.07)
Abs Immature Granulocytes: 0.1 10*3/uL — ABNORMAL HIGH (ref 0.00–0.07)
Basophils Absolute: 0 10*3/uL (ref 0.0–0.1)
Basophils Absolute: 0 10*3/uL (ref 0.0–0.1)
Basophils Relative: 0 %
Basophils Relative: 0 %
Eosinophils Absolute: 0.1 10*3/uL (ref 0.0–0.5)
Eosinophils Absolute: 0.1 10*3/uL (ref 0.0–0.5)
Eosinophils Relative: 1 %
Eosinophils Relative: 1 %
HCT: 36.1 % (ref 36.0–46.0)
HCT: 37.9 % (ref 36.0–46.0)
Hemoglobin: 11.7 g/dL — ABNORMAL LOW (ref 12.0–15.0)
Hemoglobin: 11.7 g/dL — ABNORMAL LOW (ref 12.0–15.0)
Immature Granulocytes: 0 %
Immature Granulocytes: 1 %
Lymphocytes Relative: 3 %
Lymphocytes Relative: 3 %
Lymphs Abs: 0.6 10*3/uL — ABNORMAL LOW (ref 0.7–4.0)
Lymphs Abs: 0.6 10*3/uL — ABNORMAL LOW (ref 0.7–4.0)
MCH: 26.8 pg (ref 26.0–34.0)
MCH: 26.6 pg (ref 26.0–34.0)
MCHC: 32.4 g/dL (ref 30.0–36.0)
MCHC: 30.9 g/dL (ref 30.0–36.0)
MCV: 82.6 fL (ref 80.0–100.0)
MCV: 86.1 fL (ref 80.0–100.0)
Monocytes Absolute: 0.9 10*3/uL (ref 0.1–1.0)
Monocytes Absolute: 1 10*3/uL (ref 0.1–1.0)
Monocytes Relative: 4 %
Monocytes Relative: 5 %
Neutro Abs: 19.9 10*3/uL — ABNORMAL HIGH (ref 1.7–7.7)
Neutro Abs: 20.2 10*3/uL — ABNORMAL HIGH (ref 1.7–7.7)
Neutrophils Relative %: 92 %
Neutrophils Relative %: 90 %
Platelets: 286 10*3/uL (ref 150–400)
Platelets: 288 10*3/uL (ref 150–400)
RBC: 4.37 MIL/uL (ref 3.87–5.11)
RBC: 4.4 MIL/uL (ref 3.87–5.11)
RDW: 14.9 % (ref 11.5–15.5)
RDW: 15 % (ref 11.5–15.5)
WBC: 21.7 10*3/uL — ABNORMAL HIGH (ref 4.0–10.5)
WBC: 22.1 10*3/uL — ABNORMAL HIGH (ref 4.0–10.5)
nRBC: 0 % (ref 0.0–0.2)
nRBC: 0 % (ref 0.0–0.2)

## 2018-12-23 LAB — BLOOD GAS, VENOUS
Acid-Base Excess: 7.6 mmol/L — ABNORMAL HIGH (ref 0.0–2.0)
Bicarbonate: 32 mmol/L — ABNORMAL HIGH (ref 20.0–28.0)
O2 Saturation: 87.2 %
Patient temperature: 37
pCO2, Ven: 43 mmHg — ABNORMAL LOW (ref 44.0–60.0)
pH, Ven: 7.48 — ABNORMAL HIGH (ref 7.250–7.430)
pO2, Ven: 49 mmHg — ABNORMAL HIGH (ref 32.0–45.0)

## 2018-12-23 LAB — LACTIC ACID, PLASMA
Lactic Acid, Venous: 1.3 mmol/L (ref 0.5–1.9)
Lactic Acid, Venous: 1.2 mmol/L (ref 0.5–1.9)
Lactic Acid, Venous: 1.4 mmol/L (ref 0.5–1.9)

## 2018-12-23 LAB — COMPREHENSIVE METABOLIC PANEL
ALT: 8 U/L (ref 0–44)
ALT: 9 U/L (ref 0–44)
AST: 10 U/L — ABNORMAL LOW (ref 15–41)
AST: 11 U/L — ABNORMAL LOW (ref 15–41)
Albumin: 2.8 g/dL — ABNORMAL LOW (ref 3.5–5.0)
Albumin: 2.9 g/dL — ABNORMAL LOW (ref 3.5–5.0)
Alkaline Phosphatase: 74 U/L (ref 38–126)
Alkaline Phosphatase: 69 U/L (ref 38–126)
Anion gap: 11 (ref 5–15)
Anion gap: 11 (ref 5–15)
BUN: 13 mg/dL (ref 8–23)
BUN: 12 mg/dL (ref 8–23)
CO2: 28 mmol/L (ref 22–32)
CO2: 27 mmol/L (ref 22–32)
Calcium: 9.6 mg/dL (ref 8.9–10.3)
Calcium: 9.4 mg/dL (ref 8.9–10.3)
Chloride: 96 mmol/L — ABNORMAL LOW (ref 98–111)
Chloride: 101 mmol/L (ref 98–111)
Creatinine, Ser: 0.61 mg/dL (ref 0.44–1.00)
Creatinine, Ser: 0.57 mg/dL (ref 0.44–1.00)
GFR calc Af Amer: >60 mL/min (ref >60)
GFR calc Af Amer: >60 mL/min (ref >60)
GFR calc non Af Amer: >60 mL/min (ref >60)
GFR calc non Af Amer: >60 mL/min (ref >60)
Glucose, Bld: 264 mg/dL — ABNORMAL HIGH (ref 70–99)
Glucose, Bld: 290 mg/dL — ABNORMAL HIGH (ref 70–99)
Potassium: 3.6 mmol/L (ref 3.5–5.1)
Potassium: 3.8 mmol/L (ref 3.5–5.1)
Sodium: 135 mmol/L (ref 135–145)
Sodium: 139 mmol/L (ref 135–145)
Total Bilirubin: 0.8 mg/dL (ref 0.3–1.2)
Total Bilirubin: 0.6 mg/dL (ref 0.3–1.2)
Total Protein: 7 g/dL (ref 6.5–8.1)
Total Protein: 6.8 g/dL (ref 6.5–8.1)

## 2018-12-23 LAB — PROTIME-INR
INR: 1.2 (ref 0.8–1.2)
Prothrombin Time: 15 seconds (ref 11.4–15.2)

## 2018-12-23 LAB — TROPONIN I
Troponin I: 0.21 ng/mL (ref <0.03)
Troponin I: 0.25 ng/mL (ref <0.03)
Troponin I: 0.33 ng/mL

## 2018-12-23 LAB — MRSA PCR SCREENING: MRSA by PCR: POSITIVE — AB

## 2018-12-23 LAB — PROCALCITONIN: Procalcitonin: 0.97 ng/mL

## 2018-12-23 LAB — SARS CORONAVIRUS 2 BY RT PCR (HOSPITAL ORDER, PERFORMED IN ~~LOC~~ HOSPITAL LAB): SARS Coronavirus 2: NEGATIVE

## 2018-12-23 LAB — APTT: aPTT: 31 seconds (ref 24–36)

## 2018-12-23 MED ORDER — INSULIN ASPART 100 UNIT/ML ~~LOC~~ SOLN: 0.0000 [IU] | Freq: Every day | SUBCUTANEOUS | Status: DC

## 2018-12-23 MED ORDER — INSULIN GLARGINE 100 UNIT/ML ~~LOC~~ SOLN
6.0000 [IU] | Freq: Every day | SUBCUTANEOUS | Status: DC
  Administered 2018-12-23 – 2018-12-24 (×2): 6 [IU] via SUBCUTANEOUS
  Filled 2018-12-23 (×4): qty 0.06

## 2018-12-23 MED ORDER — LABETALOL HCL 5 MG/ML IV SOLN
10.0000 mg | Freq: Once | INTRAVENOUS | Status: AC
  Administered 2018-12-23: 13:00:00 10 mg via INTRAVENOUS

## 2018-12-23 MED ORDER — IPRATROPIUM-ALBUTEROL 0.5-2.5 (3) MG/3ML IN SOLN
3.0000 mL | Freq: Two times a day (BID) | RESPIRATORY_TRACT | Status: DC
  Administered 2018-12-23 – 2018-12-28 (×10): 3 mL via RESPIRATORY_TRACT
  Filled 2018-12-23 (×11): qty 3

## 2018-12-23 MED ORDER — AZTREONAM 1 G IJ SOLR: 1.0000 g | Freq: Three times a day (TID) | INTRAMUSCULAR | Status: DC

## 2018-12-23 MED ORDER — SODIUM CHLORIDE 0.9% FLUSH
10.0000 mL | Freq: Two times a day (BID) | INTRAVENOUS | Status: DC
  Administered 2018-12-23: 16:00:00 30 mL
  Administered 2018-12-23: 21:00:00 20 mL
  Administered 2018-12-24 – 2018-12-26 (×5): 10 mL

## 2018-12-23 MED ORDER — SODIUM CHLORIDE 0.9 % IV SOLN
INTRAVENOUS | Status: DC
  Administered 2018-12-23: 16:00:00 via INTRAVENOUS

## 2018-12-23 MED ORDER — NITROGLYCERIN 0.4 MG SL SUBL: 0.4000 mg | SUBLINGUAL_TABLET | SUBLINGUAL | Status: DC | PRN

## 2018-12-23 MED ORDER — LORAZEPAM 2 MG/ML IJ SOLN
1.0000 mg | INTRAMUSCULAR | Status: DC | PRN
  Administered 2018-12-24: 1 mg via INTRAVENOUS
  Filled 2018-12-23: qty 1

## 2018-12-23 MED ORDER — VANCOMYCIN HCL 10 G IV SOLR: 1250.0000 mg | INTRAVENOUS | Status: DC

## 2018-12-23 MED ORDER — INSULIN ASPART 100 UNIT/ML ~~LOC~~ SOLN
0.0000 [IU] | SUBCUTANEOUS | Status: DC
  Administered 2018-12-23: 23:00:00 2 [IU] via SUBCUTANEOUS
  Administered 2018-12-23 (×2): 5 [IU] via SUBCUTANEOUS
  Administered 2018-12-24 (×4): 2 [IU] via SUBCUTANEOUS
  Administered 2018-12-24: 19:00:00 3 [IU] via SUBCUTANEOUS
  Administered 2018-12-24: 17:00:00 2 [IU] via SUBCUTANEOUS
  Administered 2018-12-25: 20:00:00 3 [IU] via SUBCUTANEOUS
  Administered 2018-12-25 (×3): 2 [IU] via SUBCUTANEOUS
  Administered 2018-12-25: 23:00:00 3 [IU] via SUBCUTANEOUS
  Administered 2018-12-25: 13:00:00 2 [IU] via SUBCUTANEOUS
  Administered 2018-12-26: 16:00:00 5 [IU] via SUBCUTANEOUS
  Administered 2018-12-26: 3 [IU] via SUBCUTANEOUS
  Administered 2018-12-26: 04:00:00 2 [IU] via SUBCUTANEOUS
  Administered 2018-12-26: 12:00:00 5 [IU] via SUBCUTANEOUS
  Administered 2018-12-26: 2 [IU] via SUBCUTANEOUS
  Administered 2018-12-27: 08:00:00 1 [IU] via SUBCUTANEOUS
  Administered 2018-12-27: 3 [IU] via SUBCUTANEOUS
  Administered 2018-12-27: 04:00:00 2 [IU] via SUBCUTANEOUS
  Administered 2018-12-27: 20:00:00 1 [IU] via SUBCUTANEOUS
  Administered 2018-12-27: 18:00:00 2 [IU] via SUBCUTANEOUS
  Administered 2018-12-27: 1 [IU] via SUBCUTANEOUS
  Administered 2018-12-27 – 2018-12-28 (×2): 2 [IU] via SUBCUTANEOUS
  Administered 2018-12-28: 09:00:00 1 [IU] via SUBCUTANEOUS
  Filled 2018-12-23 (×29): qty 1

## 2018-12-23 MED ORDER — LABETALOL HCL 5 MG/ML IV SOLN
INTRAVENOUS | Status: AC
  Administered 2018-12-23: 13:00:00 10 mg via INTRAVENOUS
  Filled 2018-12-23: qty 4

## 2018-12-23 MED ORDER — PHENOL 1.4 % MT LIQD
1.0000 | OROMUCOSAL | Status: DC | PRN
  Administered 2018-12-23: 20:00:00 1 via OROMUCOSAL
  Filled 2018-12-23: qty 177

## 2018-12-23 MED ORDER — SODIUM CHLORIDE 0.9 % IV SOLN
Freq: Once | INTRAVENOUS | Status: AC
  Administered 2018-12-23: 12:00:00 via INTRAVENOUS

## 2018-12-23 MED ORDER — VANCOMYCIN HCL IN DEXTROSE 1-5 GM/200ML-% IV SOLN
1000.0000 mg | Freq: Once | INTRAVENOUS | Status: DC
  Filled 2018-12-23: qty 200

## 2018-12-23 MED ORDER — ONDANSETRON HCL 4 MG/2ML IJ SOLN
4.0000 mg | Freq: Once | INTRAMUSCULAR | Status: AC
  Administered 2018-12-23: 12:00:00 4 mg via INTRAVENOUS
  Filled 2018-12-23: qty 2

## 2018-12-23 MED ORDER — SODIUM CHLORIDE 0.9 % IV SOLN
1.0000 g | Freq: Three times a day (TID) | INTRAVENOUS | Status: DC
  Filled 2018-12-23 (×2): qty 1

## 2018-12-23 MED ORDER — SODIUM CHLORIDE 0.9 % IV SOLN
1.0000 g | INTRAVENOUS | Status: DC
  Administered 2018-12-23: 18:00:00 1 g via INTRAVENOUS
  Filled 2018-12-23: qty 1

## 2018-12-23 MED ORDER — SODIUM CHLORIDE 0.9 % IV SOLN
2.0000 g | Freq: Three times a day (TID) | INTRAVENOUS | Status: DC
  Filled 2018-12-23: qty 2

## 2018-12-23 MED ORDER — CHLORHEXIDINE GLUCONATE CLOTH 2 % EX PADS
6.0000 | MEDICATED_PAD | Freq: Every day | CUTANEOUS | Status: DC
  Administered 2018-12-25 – 2018-12-28 (×3): 6 via TOPICAL

## 2018-12-23 MED ORDER — ONDANSETRON HCL 4 MG/2ML IJ SOLN
4.0000 mg | Freq: Four times a day (QID) | INTRAMUSCULAR | Status: DC | PRN
  Administered 2018-12-23 – 2018-12-26 (×3): 4 mg via INTRAVENOUS
  Filled 2018-12-23 (×2): qty 2

## 2018-12-23 MED ORDER — PANTOPRAZOLE SODIUM 40 MG IV SOLR: 40.0000 mg | INTRAVENOUS | Status: DC

## 2018-12-23 MED ORDER — VANCOMYCIN HCL 10 G IV SOLR
2000.0000 mg | Freq: Once | INTRAVENOUS | Status: DC
  Filled 2018-12-23: qty 2000

## 2018-12-23 MED ORDER — LEVOFLOXACIN IN D5W 750 MG/150ML IV SOLN
750.0000 mg | Freq: Once | INTRAVENOUS | Status: AC
  Administered 2018-12-23: 14:00:00 750 mg via INTRAVENOUS
  Filled 2018-12-23: qty 150

## 2018-12-23 MED ORDER — PROMETHAZINE HCL 25 MG/ML IJ SOLN
12.5000 mg | INTRAMUSCULAR | Status: DC | PRN
  Administered 2018-12-23 (×2): 12.5 mg via INTRAVENOUS
  Filled 2018-12-23 (×2): qty 1

## 2018-12-23 MED ORDER — ENOXAPARIN SODIUM 40 MG/0.4ML ~~LOC~~ SOLN
40.0000 mg | SUBCUTANEOUS | Status: DC
  Administered 2018-12-23 – 2018-12-27 (×5): 40 mg via SUBCUTANEOUS
  Filled 2018-12-23 (×5): qty 0.4

## 2018-12-23 MED ORDER — ACETAMINOPHEN 325 MG PO TABS
650.0000 mg | ORAL_TABLET | Freq: Four times a day (QID) | ORAL | Status: DC | PRN
  Administered 2018-12-23 – 2018-12-28 (×6): 650 mg via ORAL
  Filled 2018-12-23 (×6): qty 2

## 2018-12-23 MED ORDER — SODIUM CHLORIDE 0.9% FLUSH: 10.0000 mL | INTRAVENOUS | Status: DC | PRN

## 2018-12-23 MED ORDER — PANTOPRAZOLE SODIUM 40 MG IV SOLR
40.0000 mg | INTRAVENOUS | Status: DC
  Administered 2018-12-23: 40 mg via INTRAVENOUS
  Filled 2018-12-23: qty 40

## 2018-12-23 MED ORDER — INSULIN ASPART 100 UNIT/ML ~~LOC~~ SOLN: 0.0000 [IU] | Freq: Three times a day (TID) | SUBCUTANEOUS | Status: DC

## 2018-12-23 MED ORDER — NICARDIPINE HCL IN NACL 20-0.86 MG/200ML-% IV SOLN
3.0000 mg/h | INTRAVENOUS | Status: DC
  Administered 2018-12-23: 13:00:00 5 mg/h via INTRAVENOUS
  Administered 2018-12-24: 02:00:00 3 mg/h via INTRAVENOUS
  Filled 2018-12-23 (×3): qty 200

## 2018-12-23 MED ORDER — HYDRALAZINE HCL 20 MG/ML IJ SOLN
10.0000 mg | INTRAMUSCULAR | Status: DC | PRN
  Administered 2018-12-23 (×2): 10 mg via INTRAVENOUS
  Administered 2018-12-23 – 2018-12-26 (×4): 20 mg via INTRAVENOUS
  Filled 2018-12-23 (×6): qty 1

## 2018-12-23 MED ORDER — ONDANSETRON HCL 4 MG/2ML IJ SOLN
INTRAMUSCULAR | Status: AC
  Filled 2018-12-23: qty 2

## 2018-12-23 MED ORDER — MUPIROCIN 2 % EX OINT
1.0000 "application " | TOPICAL_OINTMENT | Freq: Two times a day (BID) | CUTANEOUS | Status: AC
  Administered 2018-12-23 – 2018-12-28 (×10): 1 via NASAL
  Filled 2018-12-23: qty 22

## 2018-12-23 NOTE — ED Notes (Addendum)
Phlebotomy trying for 2nd set of cultures & trop/lactic at L arm; once completed will start BP gtt on this side.

## 2018-12-23 NOTE — ED Notes (Signed)
Patient back from CT.

## 2018-12-23 NOTE — ED Notes (Signed)
CCU RN will call this RN back once available.

## 2018-12-23 NOTE — ED Notes (Signed)
Called CCU again to give report.

## 2018-12-23 NOTE — ED Notes (Signed)
Pt's son Remington Skalsky briefly updated. Admitting provider at bedside.

## 2018-12-23 NOTE — ED Notes (Signed)
Phlebotomy at bedside.

## 2018-12-23 NOTE — ED Notes (Signed)
Notified EDP Williams about pt's BP continuing to stay elevated in 676H diastolic. New orders: labetalol 2mg .

## 2018-12-23 NOTE — ED Notes (Signed)
[facility] confirms levaquin and nicardipine compatible. Will run together.

## 2018-12-23 NOTE — ED Notes (Signed)
Patient transported to CT 

## 2018-12-23 NOTE — ED Notes (Addendum)
Ed provider looking for [place] IV at this time

## 2018-12-23 NOTE — ED Notes (Addendum)
IV team at bedside to place midline.

## 2018-12-23 NOTE — ED Notes (Signed)
Pt to have BG checked before leaving for CCU.

## 2018-12-23 NOTE — Consult Note (Signed)
Name: [name] MRN: [id number] DOB: June 05, 1941     CONSULTATION DATE: 12/23/2018  REFERRING MD : [name]  CHIEF COMPLAINT: Mental status changes  STUDIES:  UA consistent with UTI   HISTORY OF PRESENT ILLNESS:   78 year old pleasant White female seen today in the ICU for hypertensive emergency with acute mental status changes in the setting of vomiting and UTI  Patient is able to tell us that she has pain all over however her review of systems is limited  Her presenting blood pressure was 209/70 but she says she is in pain all over  CT of the head shows stable meningioma no acute findings Patient was started on empiric antibiotic therapy and placed on a nicardipine infusion for high blood pressure     PAST MEDICAL HISTORY :   has a past medical history of Acute heart failure ([place]), Aortic stenosis, CHF (congestive heart failure) ([place]), Complication of anesthesia, Diabetes mellitus without complication ([place]), Diabetic neuropathy ([place]), Edema of both legs, GERD (gastroesophageal reflux disease), High cholesterol, HTN (hypertension), Hypertension, PAD (peripheral artery disease) ([place]), Shortness of breath dyspnea, Spasm of back muscles, Stroke ([place]), and Wound, open.  has a past surgical history that includes Intramedullary (im) nail intertrochanteric (Left, 02/04/2014); ORIF toe fracture (Right, 02/08/2014); Cardiac catheterization (N/A, 12/28/2015); Cyst excision; Eye surgery (Bilateral); Bypass graft popliteal to tibial (Right, 02/28/2016); Vein harvest (Right, 02/28/2016); Radiology with anesthesia (N/A, 03/01/2016); ir generic historical (03/01/2016); ir generic historical (03/01/2016); ir generic historical (03/01/2016); and ir generic historical (06/03/2016). Prior to Admission medications   Medication Sig Start Date End Date Taking? Authorizing Provider  acetaminophen (TYLENOL) 500 MG tablet Take 1,000 mg by mouth every 6 (six) hours as needed for mild pain.   Yes [provider]  albuterol (PROVENTIL HFA;VENTOLIN HFA) 108 (90 Base) MCG/ACT inhaler Inhale 2 puffs into the lungs every 6 (six) hours as needed for wheezing or shortness of breath.    Yes [provider]  amLODipine (NORVASC) 10 MG tablet Take 10 mg by mouth daily.   Yes [provider]  aspirin 325 MG tablet Take 1 tablet (325 mg total) by mouth daily with breakfast. Patient taking differently: Take 325 mg by mouth daily.  07/10/16  Yes Holley Raring, MD  atorvastatin (LIPITOR) 20 MG tablet Take 1 tablet (20 mg total) by mouth daily. 03/13/16  Yes Virgina Jock A, PA-C  budesonide (PULMICORT) 0.25 MG/2ML nebulizer solution Take 2 mLs (0.25 mg total) by nebulization 2 (two) times daily. 06/10/18  Yes Dustin Flock, MD  carvedilol (COREG) 6.25 MG tablet Take 6.25 mg by mouth 2 (two) times daily.    Yes [provider]  cetirizine (ZYRTEC) 10 MG tablet Take 10 mg by mouth daily.    Yes [provider]  cholecalciferol (VITAMIN D) 1000 units tablet Take 2,000 Units by mouth daily.    Yes [provider]  clopidogrel (PLAVIX) 75 MG tablet Take 1 tablet (75 mg total) by mouth daily with breakfast. 03/04/17  Yes Conrad Jeddito, MD  docusate sodium (COLACE) 100 MG capsule Take 100 mg by mouth 2 (two) times daily.   Yes [provider]  fluticasone (FLONASE) 50 MCG/ACT nasal spray Place 1 spray into both nostrils every evening.    Yes [provider]  insulin glargine (LANTUS) 100 UNIT/ML injection Inject 0.27 mLs (27 Units total) into the skin at bedtime. Patient taking differently: Inject 20 Units into the skin at bedtime.  09/16/16  Yes Reyne Dumas, MD  ipratropium-albuterol (DUONEB) 0.5-2.5 (3) MG/3ML SOLN Take 3 mLs by nebulization 2 (two) times daily. 06/10/18  Yes Dustin Flock, MD  isosorbide dinitrate (ISORDIL) 30 MG tablet Take 30 mg by mouth daily.  04/16/17  Yes [provider]  modafinil (PROVIGIL) 100 MG tablet Take 2 tablets (200 mg  total) by mouth daily. 06/11/18  Yes Dustin Flock, MD  nitroGLYCERIN (NITROSTAT) 0.4 MG SL tablet Place 1 tablet (0.4 mg total) under the tongue every 5 (five) minutes as needed for chest pain. 07/09/16  Yes Holley Raring, MD  omeprazole (PRILOSEC) 20 MG capsule Take 1 capsule (20 mg total) by mouth daily. 04/17/18  Yes Sainani, Belia Heman, MD  polyethylene glycol (MIRALAX / GLYCOLAX) packet Take 17 g by mouth 2 (two) times daily. 02/09/14  Yes Thurnell Lose, MD  potassium chloride SA (K-DUR,KLOR-CON) 20 MEQ tablet Take 20 mEq by mouth daily.   Yes [provider]  torsemide (DEMADEX) 20 MG tablet Take 2 tablets (40 mg total) by mouth 2 (two) times daily. 04/17/18  Yes Henreitta Leber, MD   Allergies  Allergen Reactions  . Iodine Anaphylaxis  . Penicillins Anaphylaxis    Tolerates ceftriaxone, cefazolin Has patient had a PCN reaction causing immediate rash, facial/tongue/throat swelling, SOB or lightheadedness with hypotension: Unknown Has patient had a PCN reaction causing severe rash involving mucus membranes or skin necrosis: Unknown Has patient had a PCN reaction that required hospitalization: Unknown Has patient had a PCN reaction occurring within the last 10 years: Unknown If all of the above answers are "NO", then may proceed with Cephalosporin u  . Shellfish Allergy Anaphylaxis  . Shellfish-Derived Products Anaphylaxis  . Sulfa Antibiotics Anaphylaxis  . Sulfacetamide Sodium Anaphylaxis  . Sulfasalazine Anaphylaxis  . Eggs Or Egg-Derived Products     Other reaction(s): SHORTNESS OF BREATH  . Morphine And Related Other (See Comments)    Altered mental status    FAMILY HISTORY:  family history includes CVA in her father; Diabetes in her father and another family member; Hypertension in her father; Liver disease in her mother. SOCIAL HISTORY:  reports that she has never smoked. She has never used smokeless tobacco. She reports that she does not drink alcohol or use drugs.   REVIEW OF SYSTEMS:   Unable to obtain due to critical illness   VITAL SIGNS: Temp:  [99.8 F (37.7 C)] 99.8 F (37.7 C) (05/20 0853) Pulse Rate:  [85-97] 93 (05/20 1443) Resp:  [19-25] 19 (05/20 1444) BP: (151-225)/(55-92) 156/61 (05/20 1446) SpO2:  [93 %-100 %] 94 % (05/20 1446) Weight:  [90.7 kg] 90.7 kg (05/20 0854)   No intake/output data recorded. No intake/output data recorded.   SpO2: 94 % O2 Flow Rate (L/min): 2 L/min   Physical Examination:  GENERAL:critically ill appearing, HEAD: Normocephalic, atraumatic.  EYES: Pupils equal, round, reactive to light.  No scleral icterus.  MOUTH: Moist mucosal membrane. NECK: Supple. No JVD.  PULMONARY: +rhonchi, CARDIOVASCULAR: S1 and S2. Regular rate and rhythm. No murmurs, rubs, or gallops.  GASTROINTESTINAL: Soft, nontender, -distended. No masses. Positive bowel sounds. No hepatosplenomegaly.  MUSCULOSKELETAL: No swelling, clubbing, or edema.  NEUROLOGIC: lethargic but arousable SKIN:intact,warm,dry    MEDICATIONS: I have reviewed all medications and confirmed regimen as documented   CULTURE RESULTS   Recent Results (from the past 240 hour(s))  SARS Coronavirus 2 (CEPHEID - Performed in [facility] hospital lab), Hosp Order     Status: None   Collection Time: 12/23/18  9:55 AM  Result Value  Ref Range Status   SARS Coronavirus 2 NEGATIVE NEGATIVE Final    Comment: (NOTE) If result is NEGATIVE SARS-CoV-2 target nucleic acids are NOT DETECTED. The SARS-CoV-2 RNA is generally detectable in upper and lower  respiratory specimens during the acute phase of infection. The lowest  concentration of SARS-CoV-2 viral copies this assay can detect is 250  copies / mL. A negative result does not preclude SARS-CoV-2 infection  and should not be used as the sole basis for treatment or other  patient management decisions.  A negative result may occur with  improper specimen collection / handling, submission of specimen other   than nasopharyngeal swab, presence of viral mutation(s) within the  areas targeted by this assay, and inadequate number of viral copies  (<250 copies / mL). A negative result must be combined with clinical  observations, patient history, and epidemiological information. If result is POSITIVE SARS-CoV-2 target nucleic acids are DETECTED. The SARS-CoV-2 RNA is generally detectable in upper and lower  respiratory specimens dur ing the acute phase of infection.  Positive  results are indicative of active infection with SARS-CoV-2.  Clinical  correlation with patient history and other diagnostic information is  necessary to determine patient infection status.  Positive results do  not rule out bacterial infection or co-infection with other viruses. If result is PRESUMPTIVE POSTIVE SARS-CoV-2 nucleic acids MAY BE PRESENT.   A presumptive positive result was obtained on the submitted specimen  and confirmed on repeat testing.  While 2019 novel coronavirus  (SARS-CoV-2) nucleic acids may be present in the submitted sample  additional confirmatory testing may be necessary for epidemiological  and / or clinical management purposes  to differentiate between  SARS-CoV-2 and other Sarbecovirus currently known to infect humans.  If clinically indicated additional testing with an alternate test  methodology [phone]) is advised. The SARS-CoV-2 RNA is generally  detectable in upper and lower respiratory sp ecimens during the acute  phase of infection. The expected result is Negative. Fact Sheet for Patients:  StrictlyIdeas.no Fact Sheet for Healthcare Providers: BankingDealers.co.za This test is not yet approved or cleared by the [place] FDA and has been authorized for detection and/or diagnosis of SARS-CoV-2 by FDA under an Emergency Use Authorization (EUA).  This EUA will remain in effect (meaning this test can be used) for the duration of the  COVID-19 declaration under Section 564(b)(1) of the Act, 21 U.S.C. section 360bbb-3(b)(1), unless the authorization is terminated or revoked sooner. Performed at [facility], [address] [place], [place] [address]           IMAGING    Dg Chest 1 View  Result Date: 12/23/2018 CLINICAL DATA:  Altered mental status.  Hypertension. EXAM: CHEST  1 VIEW COMPARISON:  July 18, 2018 FINDINGS: There is no appreciable edema or consolidation. There is cardiomegaly with pulmonary vascularity normal. No adenopathy. No bone lesions. IMPRESSION: Cardiomegaly.  No edema or consolidation. Electronically Signed   By: Lowella Grip III M.D.   On: 12/23/2018 09:26   Ct Head Wo Contrast  Result Date: 12/23/2018 CLINICAL DATA:  Altered mental status EXAM: CT HEAD WITHOUT CONTRAST TECHNIQUE: Contiguous axial images were obtained from the base of the skull through the vertex without intravenous contrast. COMPARISON:  07/18/2018 FINDINGS: Brain: Chronic atrophic and ischemic changes are again identified and stable. No findings to suggest acute hemorrhage or acute infarction are noted. Calcified meningioma at the base of the right posterior fossa is noted Vascular: No hyperdense vessel or unexpected  calcification. Skull: Normal. Negative for fracture or focal lesion. Sinuses/Orbits: No acute finding. Other: None. IMPRESSION: Stable right posterior cranial fossa meningioma. Stable atrophic and ischemic changes without acute abnormality. Electronically Signed   By: Inez Catalina M.D.   On: 12/23/2018 09:58        ASSESSMENT AND PLAN SYNOPSIS  Acute encephalopathy most likely from UTI with a history of a stroke with possible acute pain no obvious source of pain  Acute encephalopathy from hypertensive crisis Wean nicardipine infusion to off and assess blood pressure Patient unable to take oral meds at this time We will prescribe as needed  antihypertensive meds  Urinary tract infection  Antibiotics changed to Rocephin only Follow-up cultures   CARDIAC ICU monitoring  ID -continue IV abx as prescibed -follow up cultures  GI GI PROPHYLAXIS as indicated  NUTRITIONAL STATUS DIET--> n.p.o. Constipation protocol as indicated   ENDO - will use ICU hypoglycemic\Hyperglycemia protocol if needed    ELECTROLYTES -follow labs as needed -replace as needed -pharmacy consultation and following   DVT/GI PRX ordered TRANSFUSIONS AS NEEDED MONITOR FSBS ASSESS the need for LABS as needed   Critical Care Time devoted to patient care services described in this note is 45 minutes.   Overall, patient is critically ill, prognosis is guarded.     Corrin Parker, M.D.  [facility]  Medical Director [place] Director [facility] Cardio-Pulmonary Department

## 2018-12-23 NOTE — Progress Notes (Signed)
Pharmacy Antibiotic Note  [name] is a 78 y.o. female admitted on 12/23/2018 with sepsis.  Pharmacy has been consulted for Vancomycin and Aztreonam dosing. Patient has penicillin allergy but has tolerated cephalosporins in the past. Will change Aztreonam to Cefepime per consult.  Plan: Vancomycin 2000mg  IV loading dose followed by  Vancomycin 1250 mg IV Q 24 hrs. Goal AUC 400-550. Expected AUC: 523 SCr used: 0.8 (actual SCr of 0.61) Expected Cmin: 11.7  Will discontinue Aztreonam.  Cefepime 2g IV q8h   Height: 5\' 7"  (170.2 cm) Weight: 200 lb (90.7 kg) IBW/kg (Calculated) : 61.6  Temp (24hrs), Avg:99.8 F (37.7 C), Min:99.8 F (37.7 C), Max:99.8 F (37.7 C)  Recent Labs  Lab 12/23/18 1113 12/23/18 1300  WBC 21.7*  --   CREATININE 0.61  --   LATICACIDVEN  --  1.3    Estimated Creatinine Clearance: 69.1 mL/min (by C-G formula based on SCr of 0.61 mg/dL).    Allergies  Allergen Reactions  . Iodine Anaphylaxis  . Penicillins Anaphylaxis    Tolerates ceftriaxone, cefazolin Has patient had a PCN reaction causing immediate rash, facial/tongue/throat swelling, SOB or lightheadedness with hypotension: Unknown Has patient had a PCN reaction causing severe rash involving mucus membranes or skin necrosis: Unknown Has patient had a PCN reaction that required hospitalization: Unknown Has patient had a PCN reaction occurring within the last 10 years: Unknown If all of the above answers are "NO", then may proceed with Cephalosporin u  . Shellfish Allergy Anaphylaxis  . Shellfish-Derived Products Anaphylaxis  . Sulfa Antibiotics Anaphylaxis  . Sulfacetamide Sodium Anaphylaxis  . Sulfasalazine Anaphylaxis  . Eggs Or Egg-Derived Products     Other reaction(s): SHORTNESS OF BREATH  . Morphine And Related Other (See Comments)    Altered mental status    Antimicrobials this admission: Levaquin  5/20 x 1 in ED Vancomycin  5/20 >>  Cefepime 5/20 >>  Thank you for allowing  pharmacy to be a part of this patient's care.  Paulina Fusi, PharmD, BCPS 12/23/2018 2:05 PM

## 2018-12-23 NOTE — ED Triage Notes (Signed)
Patient arrived from home by EMS for AMS and emesis since this AM. Baseline patient is normally bed bound and A&O x4. Patient non-verbal at this time and opens eyes to verbal stimuli. HX diabetes, HTN, and stroke with right side deficits. EMS vitals 552 systolic, 08YE, 233 blood sugar, 90% RA and placed on 2L O2 sats of 100%, 98.1 ax

## 2018-12-23 NOTE — H&P (Signed)
[facility] NAME: [name]    MR#:  [id number]  DATE OF BIRTH:  1940/08/12  DATE OF ADMISSION:  12/23/2018  PRIMARY CARE PHYSICIAN: [name], MD   REQUESTING/REFERRING PHYSICIAN: Earleen Newport, MD  CHIEF COMPLAINT:   Altered mental status HISTORY OF PRESENT ILLNESS:  [name]  is a 78 y.o. female with a known history of insulin requiring diabetes mellitus, GERD, hyperlipidemia, hypertension, old history of stroke is brought into the ED for altered mental status via EMS.  Patient was vomiting and appeared more confused than her baseline at around 3 AM in the morning CT head with stable meningioma no acute findings.  Blood pressure is very high at 209/70.  Cover test is negative.  Urinalysis is abnormal and urine cultures were sent.  Patient is opening her eyes during my examination but not providing any information.  IV labetalol was given with no significant improvement in the blood pressure.  Patient is started on broad-spectrum IV antibiotics aztreonam and vancomycin by the ED physician.  PAST MEDICAL HISTORY:   Past Medical History:  Diagnosis Date  . Acute heart failure ([place])   . Aortic stenosis   . CHF (congestive heart failure) ([place])   . Complication of anesthesia    Hard to wake patient up after having anesthesia  . Diabetes mellitus without complication ([place])   . Diabetic neuropathy ([facility])    Takes Lyrica  . Edema of both legs    Takes Lasix  . GERD (gastroesophageal reflux disease)   . High cholesterol   . HTN (hypertension)   . Hypertension   . PAD (peripheral artery disease) ([place])   . Shortness of breath dyspnea   . Spasm of back muscles   . Stroke ([place])   . Wound, open    Right great toe    PAST SURGICAL HISTOIRY:   Past Surgical History:  Procedure Laterality Date  . BYPASS GRAFT POPLITEAL TO TIBIAL Right 02/28/2016   Procedure: BYPASS GRAFT RIGHT BELOW KNEE POPLITEAL TO PERONEAL  USING REVERSED RIGHT GREATER SAPHENOUS VEIN;  Surgeon: Conrad [place], MD;  Location: [facility];  Service: Vascular;  Laterality: Right;  . CYST EXCISION     Abdomen  . EYE SURGERY Bilateral    Cataract removal  . INTRAMEDULLARY (IM) NAIL INTERTROCHANTERIC Left 02/04/2014   Procedure: INTRAMEDULLARY (IM) NAIL INTERTROCHANTRIC FEMORAL;  Surgeon: Mauri Pole, MD;  Location: [facility];  Service: Orthopedics;  Laterality: Left;  . IR GENERIC HISTORICAL  03/01/2016   IR ANGIO INTRA EXTRACRAN SEL COM CAROTID INNOMINATE UNI R MOD SED 03/01/2016 [name], MD [facility]  . IR GENERIC HISTORICAL  03/01/2016   IR ENDOVASC INTRACRANIAL INF OTHER THAN THROMBO ART INC DIAG ANGIO 03/01/2016 [name], MD [facility]  . IR GENERIC HISTORICAL  03/01/2016   IR INTRAVSC STENT CERV CAROTID W/O EMB-PROT MOD SED INC ANGIO 03/01/2016 [name], MD [facility]  . IR GENERIC HISTORICAL  06/03/2016   IR RADIOLOGIST EVAL & MGMT 06/03/2016 [facility]  . ORIF TOE FRACTURE Right 02/08/2014   Procedure: OPEN REDUCTION INTERNAL FIXATION Right METATARSAL  FRACTURE ;  Surgeon: Wylene Simmer, MD;  Location: [facility];  Service: Orthopedics;  Laterality: Right;  . PERIPHERAL VASCULAR CATHETERIZATION N/A 12/28/2015   Procedure: Abdominal Aortogram w/Lower Extremity;  Surgeon: Conrad , MD;  Location: [facility];  Service: Cardiovascular;  Laterality: N/A;  . RADIOLOGY WITH ANESTHESIA N/A 03/01/2016  Procedure: RADIOLOGY WITH ANESTHESIA;  Surgeon: [name], MD;  Location: [facility];  Service: Radiology;  Laterality: N/A;  . VEIN HARVEST Right 02/28/2016   Procedure: RIGHT GREATER SAPHENOUS VEIN HARVEST;  Surgeon: Conrad Pinnacle, MD;  Location: [facility];  Service: Vascular;  Laterality: Right;    SOCIAL HISTORY:   Social History   Tobacco Use  . Smoking status: Never Smoker  . Smokeless tobacco: Never Used  . Tobacco comment: Never smoked  Substance Use Topics  . Alcohol use: No    Alcohol/week:  0.0 standard drinks    FAMILY HISTORY:   Family History  Problem Relation Age of Onset  . Diabetes Other   . Liver disease Mother   . CVA Father   . Diabetes Father   . Hypertension Father     DRUG ALLERGIES:   Allergies  Allergen Reactions  . Iodine Anaphylaxis  . Penicillins Anaphylaxis    Tolerates ceftriaxone, cefazolin Has patient had a PCN reaction causing immediate rash, facial/tongue/throat swelling, SOB or lightheadedness with hypotension: Unknown Has patient had a PCN reaction causing severe rash involving mucus membranes or skin necrosis: Unknown Has patient had a PCN reaction that required hospitalization: Unknown Has patient had a PCN reaction occurring within the last 10 years: Unknown If all of the above answers are "NO", then may proceed with Cephalosporin u  . Shellfish Allergy Anaphylaxis  . Shellfish-Derived Products Anaphylaxis  . Sulfa Antibiotics Anaphylaxis  . Sulfacetamide Sodium Anaphylaxis  . Sulfasalazine Anaphylaxis  . Eggs Or Egg-Derived Products     Other reaction(s): SHORTNESS OF BREATH  . Morphine And Related Other (See Comments)    Altered mental status    REVIEW OF SYSTEMS:  Review of system unobtainable as the patient is encephalopathic  MEDICATIONS AT HOME:   Prior to Admission medications   Medication Sig Start Date End Date Taking? Authorizing Provider  acetaminophen (TYLENOL) 500 MG tablet Take 1,000 mg by mouth every 6 (six) hours as needed for mild pain.   Yes [provider]  albuterol (PROVENTIL HFA;VENTOLIN HFA) 108 (90 Base) MCG/ACT inhaler Inhale 2 puffs into the lungs every 6 (six) hours as needed for wheezing or shortness of breath.    Yes [provider]  amLODipine (NORVASC) 10 MG tablet Take 10 mg by mouth daily.   Yes [provider]  aspirin 325 MG tablet Take 1 tablet (325 mg total) by mouth daily with breakfast. Patient taking differently: Take 325 mg by mouth daily.  07/10/16  Yes  Holley Raring, MD  atorvastatin (LIPITOR) 20 MG tablet Take 1 tablet (20 mg total) by mouth daily. 03/13/16  Yes Virgina Jock A, PA-C  budesonide (PULMICORT) 0.25 MG/2ML nebulizer solution Take 2 mLs (0.25 mg total) by nebulization 2 (two) times daily. 06/10/18  Yes Dustin Flock, MD  carvedilol (COREG) 6.25 MG tablet Take 6.25 mg by mouth 2 (two) times daily.    Yes [provider]  cetirizine (ZYRTEC) 10 MG tablet Take 10 mg by mouth daily.    Yes [provider]  cholecalciferol (VITAMIN D) 1000 units tablet Take 2,000 Units by mouth daily.    Yes [provider]  clopidogrel (PLAVIX) 75 MG tablet Take 1 tablet (75 mg total) by mouth daily with breakfast. 03/04/17  Yes Conrad Marlton, MD  docusate sodium (COLACE) 100 MG capsule Take 100 mg by mouth 2 (two) times daily.   Yes [provider]  fluticasone (FLONASE) 50 MCG/ACT nasal spray Place 1 spray into  both nostrils every evening.    Yes [provider]  insulin glargine (LANTUS) 100 UNIT/ML injection Inject 0.27 mLs (27 Units total) into the skin at bedtime. Patient taking differently: Inject 20 Units into the skin at bedtime.  09/16/16  Yes Reyne Dumas, MD  ipratropium-albuterol (DUONEB) 0.5-2.5 (3) MG/3ML SOLN Take 3 mLs by nebulization 2 (two) times daily. 06/10/18  Yes Dustin Flock, MD  isosorbide dinitrate (ISORDIL) 30 MG tablet Take 30 mg by mouth daily.  04/16/17  Yes [provider]  modafinil (PROVIGIL) 100 MG tablet Take 2 tablets (200 mg total) by mouth daily. 06/11/18  Yes Dustin Flock, MD  nitroGLYCERIN (NITROSTAT) 0.4 MG SL tablet Place 1 tablet (0.4 mg total) under the tongue every 5 (five) minutes as needed for chest pain. 07/09/16  Yes Holley Raring, MD  omeprazole (PRILOSEC) 20 MG capsule Take 1 capsule (20 mg total) by mouth daily. 04/17/18  Yes Sainani, Belia Heman, MD  polyethylene glycol (MIRALAX / GLYCOLAX) packet Take 17 g by mouth 2 (two) times daily. 02/09/14  Yes  Thurnell Lose, MD  potassium chloride SA (K-DUR,KLOR-CON) 20 MEQ tablet Take 20 mEq by mouth daily.   Yes [provider]  torsemide (DEMADEX) 20 MG tablet Take 2 tablets (40 mg total) by mouth 2 (two) times daily. 04/17/18  Yes Sainani, Belia Heman, MD      VITAL SIGNS:  Blood pressure (!) 176/69, pulse 90, temperature 99.8 F (37.7 C), temperature source Oral, resp. rate (!) 22, height '5\' 7"'  (1.702 m), weight 90.7 kg, SpO2 98 %.  PHYSICAL EXAMINATION:  GENERAL:  78 y.o.-year-old patient lying in the bed with no acute distress.  EYES: Pupils equal, round, reactive to light and accommodation. No scleral icterus. Extraocular muscles intact.  HEENT: Head atraumatic, normocephalic. Oropharynx and nasopharynx clear.  NECK:  Supple, no jugular venous distention. No thyroid enlargement, no tenderness.  LUNGS: Normal breath sounds bilaterally, no wheezing, rales,rhonchi or crepitation. No use of accessory muscles of respiration.  CARDIOVASCULAR: S1, S2 normal. No murmurs, rubs, or gallops.  ABDOMEN: Soft, nontender, nondistended. Bowel sounds present.  EXTREMITIES: No pedal edema, cyanosis, or clubbing.  NEUROLOGIC: Patient is arousable but disoriented gait not checked.  PSYCHIATRIC: The patient is arousable but disoriented SKIN: No obvious rash, lesion, or ulcer.   LABORATORY PANEL:   CBC Recent Labs  Lab 12/23/18 1113  WBC 21.7*  HGB 11.7*  HCT 36.1  PLT 286   ------------------------------------------------------------------------------------------------------------------  Chemistries  Recent Labs  Lab 12/23/18 1113  NA 135  K 3.6  CL 96*  CO2 28  GLUCOSE 264*  BUN 13  CREATININE 0.61  CALCIUM 9.6  AST 10*  ALT 8  ALKPHOS 74  BILITOT 0.8   ------------------------------------------------------------------------------------------------------------------  Cardiac Enzymes Recent Labs  Lab 12/23/18 1113  TROPONINI 0.21*    ------------------------------------------------------------------------------------------------------------------  RADIOLOGY:  Dg Chest 1 View  Result Date: 12/23/2018 CLINICAL DATA:  Altered mental status.  Hypertension. EXAM: CHEST  1 VIEW COMPARISON:  July 18, 2018 FINDINGS: There is no appreciable edema or consolidation. There is cardiomegaly with pulmonary vascularity normal. No adenopathy. No bone lesions. IMPRESSION: Cardiomegaly.  No edema or consolidation. Electronically Signed   By: Lowella Grip III M.D.   On: 12/23/2018 09:26   Ct Head Wo Contrast  Result Date: 12/23/2018 CLINICAL DATA:  Altered mental status EXAM: CT HEAD WITHOUT CONTRAST TECHNIQUE: Contiguous axial images were obtained from the base of the skull through the vertex without intravenous contrast. COMPARISON:  07/18/2018 FINDINGS: Brain:  Chronic atrophic and ischemic changes are again identified and stable. No findings to suggest acute hemorrhage or acute infarction are noted. Calcified meningioma at the base of the right posterior fossa is noted Vascular: No hyperdense vessel or unexpected calcification. Skull: Normal. Negative for fracture or focal lesion. Sinuses/Orbits: No acute finding. Other: None. IMPRESSION: Stable right posterior cranial fossa meningioma. Stable atrophic and ischemic changes without acute abnormality. Electronically Signed   By: Inez Catalina M.D.   On: 12/23/2018 09:58    EKG:   Orders placed or performed during the hospital encounter of 12/23/18  . ED EKG  . ED EKG  . EKG 12-Lead  . EKG 12-Lead    IMPRESSION AND PLAN:    #Malignant hypertension Admit to stepdown unit Cardene drip titrate as needed, titrate for systolic blood pressure 175 Currently patient is n.p.o. as she is altered Intensivist Dr. Mortimer Fries notified, he is aware of the consult  #Acute metabolic encephalopathy from sepsis Patient met septic criteria with leukocytosis and tachycardia Blood cultures, urine  cultures ordered.  Chest x-ray negative On broad-spectrum IV antibiotics aztreonam and vancomycin Monitor lactic acid and procalcitonin COVID test negative Hydrate with IV fluids  CT head no acute findings  #Diabetes mellitus-insulin requiring Patient takes Lantus 27 units nightly will start her on 6 units for basal coverage and sliding scale insulin at this time currently n.p.o.  #Elevated troponin-probably demand ischemia from malignant hypertension Monitor patient on telemetry Cycle cardiac biomarkers Echocardiogram Cardiology consult placed and Dr. Nehemiah Massed notified he is aware of the consult  #Chronic stable meningioma-CT head findings Continue close monitoring   GI prophylaxis with Protonix DVT prophylaxis with Lovenox subcu   All the records are reviewed and case discussed with ED provider. Management plans discussed with the patient's daughter [name] and she is in agreement.  May contact husband Mr. Hemmer at [phone] May contact son [name] at [phone]  CODE STATUS: FC , husband daughter and son are the healthcare POA  TOTAL CRITICAL CARE TIME TAKING CARE OF THIS PATIENT: 50  minutes.   Note: This dictation was prepared with Dragon dictation along with smaller phrase technology. Any transcriptional errors that result from this process are unintentional.  Nicholes Mango M.D on 12/23/2018 at 1:40 PM  Between 7am to 6pm - Pager - [phone]  After 6pm go to [email or web address] - password [email or web address] [facility]  [facility]  Office  [phone]  CC: Primary care physician; [name], MD

## 2018-12-23 NOTE — ED Provider Notes (Signed)
[facility] Emergency Department Provider Note       Time seen: ----------------------------------------- 8:46 AM on 12/23/2018 -----------------------------------------  Level V caveat: History/ROS limited by altered mental status I have reviewed the triage vital signs and the nursing notes.  HISTORY   Chief Complaint No chief complaint on file.    HPI [name] is a 78 y.o. female with a history of heart failure, diabetes, GERD, hyperlipidemia, hypertension, CVA who presents to the ED for altered mental status and vomiting that appeared to start around 3:00 this morning.  No further information or report is available.  Past Medical History:  Diagnosis Date  . Acute heart failure ([place])   . Aortic stenosis   . CHF (congestive heart failure) ([place])   . Complication of anesthesia    Hard to wake patient up after having anesthesia  . Diabetes mellitus without complication ([place])   . Diabetic neuropathy (HCC)    Takes Lyrica  . Edema of both legs    Takes Lasix  . GERD (gastroesophageal reflux disease)   . High cholesterol   . HTN (hypertension)   . Hypertension   . PAD (peripheral artery disease) ([place])   . Shortness of breath dyspnea   . Spasm of back muscles   . Stroke ([place])   . Wound, open    Right great toe    Patient Active Problem List   Diagnosis Date Noted  . Healthcare-associated pneumonia 06/03/2018  . Acute cystitis 04/10/2018  . Sepsis due to Streptococcus, group B ([place])   . Multiple open wounds of lower leg   . PVD (peripheral vascular disease) ([place])   . Bacteremia 08/31/2016  . Sepsis ([place]) 08/29/2016  . Benign essential HTN   . Acute on chronic diastolic heart failure ([place])   . Ulcer of right lower extremity ([place])   . Chest pain 07/05/2016  . Impacted cerumen of both ears 06/05/2016  . 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 Pleural effusion, bilateral   . Wheeze   . Dyspnea   . CHF (congestive  heart failure) ([place])   . Right sided weakness   . Acute respiratory acidosis   . Surgery, elective   . Anemia, iron deficiency   . Leukocytosis   . Cerebrovascular accident (CVA) due to thrombosis of precerebral artery ([place]) 03/01/2016  . 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 PAD (peripheral artery disease) ([place]) 02/28/2016  . Atherosclerotic peripheral vascular disease with intermittent claudication ([place]) 12/08/2015  . Open toe wound 05/04/2014  . Anemia, chronic disease 04/27/2014  . Occult blood positive stool 04/17/2014  . Edema 04/03/2014  . Unspecified hereditary and idiopathic peripheral neuropathy 03/14/2014  . DM2 (diabetes mellitus, type 2) ([place]) 02/14/2014  . Altered mental status 02/12/2014  . Acute encephalopathy 02/12/2014  . Aortic stenosis 02/04/2014  . Closed left subtrochanteric femur fracture ([place]) 02/03/2014  . Hip fracture, left ([place]) 02/03/2014  . Hypokalemia 02/03/2014  . Fibula fracture 02/03/2014  . MTP instability 02/03/2014  . Hip fracture ([place]) 02/03/2014  . HTN (hypertension)   . HLD (hyperlipidemia)     Past Surgical History:  Procedure Laterality Date  . BYPASS GRAFT POPLITEAL TO TIBIAL Right 02/28/2016   Procedure: BYPASS GRAFT RIGHT BELOW KNEE POPLITEAL TO PERONEAL USING REVERSED RIGHT GREATER SAPHENOUS VEIN;  Surgeon: Conrad Roland, MD;  Location: [employer];  Service: Vascular;  Laterality: Right;  . CYST EXCISION     Abdomen  . EYE  SURGERY Bilateral    Cataract removal  . INTRAMEDULLARY (IM) NAIL INTERTROCHANTERIC Left 02/04/2014   Procedure: INTRAMEDULLARY (IM) NAIL INTERTROCHANTRIC FEMORAL;  Surgeon: Mauri Pole, MD;  Location: [place];  Service: Orthopedics;  Laterality: Left;  . IR GENERIC HISTORICAL  03/01/2016   IR ANGIO INTRA EXTRACRAN SEL COM CAROTID INNOMINATE UNI R MOD SED 03/01/2016 [name], MD [facility]  . IR GENERIC HISTORICAL  03/01/2016   IR ENDOVASC INTRACRANIAL INF OTHER THAN  THROMBO ART INC DIAG ANGIO 03/01/2016 [name], MD [facility]  . IR GENERIC HISTORICAL  03/01/2016   IR INTRAVSC STENT CERV CAROTID W/O EMB-PROT MOD SED INC ANGIO 03/01/2016 [name], MD [facility]  . IR GENERIC HISTORICAL  06/03/2016   IR RADIOLOGIST EVAL & MGMT 06/03/2016 [facility]  . ORIF TOE FRACTURE Right 02/08/2014   Procedure: OPEN REDUCTION INTERNAL FIXATION Right METATARSAL  FRACTURE ;  Surgeon: Wylene Simmer, MD;  Location: [place];  Service: Orthopedics;  Laterality: Right;  . PERIPHERAL VASCULAR CATHETERIZATION N/A 12/28/2015   Procedure: Abdominal Aortogram w/Lower Extremity;  Surgeon: Conrad Webber, MD;  Location: [place] [facility];  Service: Cardiovascular;  Laterality: N/A;  . RADIOLOGY WITH ANESTHESIA N/A 03/01/2016   Procedure: RADIOLOGY WITH ANESTHESIA;  Surgeon: [name], MD;  Location: [place];  Service: Radiology;  Laterality: N/A;  . VEIN HARVEST Right 02/28/2016   Procedure: RIGHT GREATER SAPHENOUS VEIN HARVEST;  Surgeon: Conrad Nash, MD;  Location: [place];  Service: Vascular;  Laterality: Right;    Allergies Iodine; Penicillins; Shellfish allergy; Shellfish-derived products; Sulfa antibiotics; Sulfacetamide sodium; Sulfasalazine; and Morphine and related  Social History Social History   Tobacco Use  . Smoking status: Never Smoker  . Smokeless tobacco: Never Used  . Tobacco comment: Never smoked  Substance Use Topics  . Alcohol use: No    Alcohol/week: 0.0 standard drinks  . Drug use: No   Review of Systems Positive for altered mental status and vomiting  All systems negative/normal/unremarkable except as stated in the HPI  ____________________________________________   PHYSICAL EXAM:  VITAL SIGNS: ED Triage Vitals  Enc Vitals Group     BP      Pulse      Resp      Temp      Temp src      SpO2      Weight      Height      Head Circumference      Peak Flow      Pain Score      Pain Loc      Pain Edu?      Excl. in  [place]?    Constitutional: Alert but disoriented, she will not follow commands. no distress noted Eyes: Conjunctivae are normal. Normal extraocular movements.  Nipples are equal round and reactive to light ENT      Head: Normocephalic and atraumatic.      Nose: No congestion/rhinnorhea.      Mouth/Throat: Mucous membranes are moist.      Neck: No stridor. Cardiovascular: Normal rate, regular rhythm.  Harsh systolic murmur Respiratory: Normal respiratory effort without tachypnea nor retractions. Breath sounds are clear and equal bilaterally. No wheezes/rales/rhonchi. Gastrointestinal: Soft and nontender. Normal bowel sounds Musculoskeletal: Nontender with normal range of motion in extremities. No lower extremity tenderness nor edema. Neurologic:  Normal speech and language. No gross focal neurologic deficits are appreciated.  Skin:  Skin is warm, dry and intact. No rash noted. Psychiatric: Mood and  affect are normal. Speech and behavior are normal.  ____________________________________________  EKG: Interpreted by me.  Sinus rhythm with a rate of 96 bpm, normal PR interval, normal QRS, normal QT  ____________________________________________  ED COURSE:  As part of my medical decision making, I reviewed the following data within the [place] History obtained from family if available, nursing notes, old chart and ekg, as well as notes from prior ED visits. Patient presented for her mental status and vomiting, we will assess with labs and imaging as indicated at this time. Clinical Course as of Dec 22 1217  Wed Dec 23, 2018  1030 Family states patient has been weak and not eating for the past week or so, vomits but has had generalized poor intake.   [JW]    Clinical Course User Index [JW] Earleen Newport, MD   Procedures  [name] was evaluated in Emergency Department on 12/23/2018 for the symptoms described in the history of present illness. She was evaluated  in the context of the global COVID-19 pandemic, which necessitated consideration that the patient might be at risk for infection with the SARS-CoV-2 virus that causes COVID-19. Institutional protocols and algorithms that pertain to the evaluation of patients at risk for COVID-19 are in a state of rapid change based on information released by regulatory bodies including the CDC and federal and state organizations. These policies and algorithms were followed during the patient's care in the ED.  ____________________________________________   LABS (pertinent positives/negatives)  Labs Reviewed  CBC WITH DIFFERENTIAL/PLATELET - Abnormal; Notable for the following components:      Result Value   WBC 21.7 (*)    Hemoglobin 11.7 (*)    Neutro Abs 19.9 (*)    Lymphs Abs 0.6 (*)    Abs Immature Granulocytes 0.09 (*)    All other components within normal limits  COMPREHENSIVE METABOLIC PANEL - Abnormal; Notable for the following components:   Chloride 96 (*)    Glucose, Bld 264 (*)    Albumin 2.8 (*)    AST 10 (*)    All other components within normal limits  URINALYSIS, COMPLETE (UACMP) WITH MICROSCOPIC - Abnormal; Notable for the following components:   Color, Urine AMBER (*)    APPearance TURBID (*)    Glucose, UA >=500 (*)    Hgb urine dipstick LARGE (*)    Ketones, ur 20 (*)    Protein, ur 100 (*)    Nitrite POSITIVE (*)    Leukocytes,Ua LARGE (*)    RBC / HPF >50 (*)    WBC, UA >50 (*)    Bacteria, UA MANY (*)    All other components within normal limits  TROPONIN I - Abnormal; Notable for the following components:   Troponin I 0.21 (*)    All other components within normal limits  BLOOD GAS, VENOUS - Abnormal; Notable for the following components:   pH, Ven 7.48 (*)    pCO2, Ven 43 (*)    pO2, Ven 49.0 (*)    Bicarbonate 32.0 (*)    Acid-Base Excess 7.6 (*)    All other components within normal limits  SARS CORONAVIRUS 2 (HOSPITAL ORDER, [place] [facility])   CULTURE, BLOOD (ROUTINE X 2)  CULTURE, BLOOD (ROUTINE X 2)  URINE CULTURE  LACTIC ACID, PLASMA  CBG MONITORING, ED   CRITICAL CARE Performed by: Laurence Aly   Total critical care time: 30 minutes  Critical care time was exclusive of  separately billable procedures and treating other patients.  Critical care was necessary to treat or prevent imminent or life-threatening deterioration.  Critical care was time spent personally by me on the following activities: development of treatment plan with patient and/or surrogate as well as nursing, discussions with consultants, evaluation of patient's response to treatment, examination of patient, obtaining history from patient or surrogate, ordering and performing treatments and interventions, ordering and review of laboratory studies, ordering and review of radiographic studies, pulse oximetry and re-evaluation of patient's condition.  RADIOLOGY Images were viewed by me  CT head, chest x-ray IMPRESSION: Stable right posterior cranial fossa meningioma.  Stable atrophic and ischemic changes without acute abnormality. IMPRESSION: Cardiomegaly.  No edema or consolidation. ____________________________________________   DIFFERENTIAL DIAGNOSIS   Gastroenteritis, dehydration, electrolyte abnormality, CVA, seizure, occult infection  FINAL ASSESSMENT AND PLAN  Altered mental status, UTI with possible urosepsis, elevated troponin, vomiting   Plan: The patient had presented for altered mental status and vomiting. Patient's labs do indicate significant leukocytosis and significant urinary tract infection. Patient's imaging was unremarkable.  We have obtained cultures and given broad-spectrum antibiotics.  She is also received IV fluids.  I will discuss with the hospitalist for admission.   Laurence Aly, MD    Note: This note was generated in part or whole with voice recognition software. Voice recognition is usually quite  accurate but there are transcription errors that can and very often do occur. I apologize for any typographical errors that were not detected and corrected.     Earleen Newport, MD 12/23/18 [phone]

## 2018-12-23 NOTE — ED Notes (Signed)
Fall risk band on pt's R wrist; non-slip socks on pt's feet.

## 2018-12-23 NOTE — ED Notes (Signed)
Lab called to assist with blood cultures as pt is very hard stick.

## 2018-12-23 NOTE — ED Notes (Signed)
Channel error; will start BP gtt once new/working channel found.

## 2018-12-23 NOTE — ED Notes (Signed)
X-ray at bedside

## 2018-12-23 NOTE — Progress Notes (Signed)
[name] Hong, NP notified of troponin (trending up since original draw). [name]

## 2018-12-24 ENCOUNTER — Inpatient Hospital Stay: Admit: 2018-12-24 | Payer: Medicare HMO

## 2018-12-24 ENCOUNTER — Inpatient Hospital Stay
Admit: 2018-12-24 | Discharge: 2018-12-24 | Disposition: A | Discharge status: Home / Self Care | Payer: Medicare HMO | Attending: Internal Medicine | Admitting: Internal Medicine

## 2018-12-24 DIAGNOSIS — R7989 Other specified abnormal findings of blood chemistry: Secondary | ICD-10-CM

## 2018-12-24 DIAGNOSIS — N3001 Acute cystitis with hematuria: Secondary | ICD-10-CM

## 2018-12-24 DIAGNOSIS — R6510 Systemic inflammatory response syndrome (SIRS) of non-infectious origin without acute organ dysfunction: Secondary | ICD-10-CM

## 2018-12-24 DIAGNOSIS — R4182 Altered mental status, unspecified: Secondary | ICD-10-CM

## 2018-12-24 DIAGNOSIS — Z515 Encounter for palliative care: Secondary | ICD-10-CM

## 2018-12-24 DIAGNOSIS — Z7189 Other specified counseling: Secondary | ICD-10-CM

## 2018-12-24 DIAGNOSIS — A419 Sepsis, unspecified organism: Secondary | ICD-10-CM

## 2018-12-24 LAB — ECHOCARDIOGRAM COMPLETE
Height: 66 in
Weight: 2737.23 oz

## 2018-12-24 LAB — BLOOD CULTURE ID PANEL (REFLEXED)

## 2018-12-24 LAB — MAGNESIUM: Magnesium: 2.1 mg/dL (ref 1.7–2.4)

## 2018-12-24 LAB — BASIC METABOLIC PANEL
Anion gap: 12 (ref 5–15)
BUN: 12 mg/dL (ref 8–23)
CO2: 25 mmol/L (ref 22–32)
Calcium: 9.3 mg/dL (ref 8.9–10.3)
Chloride: 102 mmol/L (ref 98–111)
Creatinine, Ser: 0.53 mg/dL (ref 0.44–1.00)
GFR calc Af Amer: >60 mL/min (ref >60)
GFR calc non Af Amer: >60 mL/min (ref >60)
Glucose, Bld: 193 mg/dL — ABNORMAL HIGH (ref 70–99)
Potassium: 3.9 mmol/L (ref 3.5–5.1)
Sodium: 139 mmol/L (ref 135–145)

## 2018-12-24 LAB — GLUCOSE, CAPILLARY
Glucose-Capillary: 171 mg/dL — ABNORMAL HIGH (ref 70–99)
Glucose-Capillary: 167 mg/dL — ABNORMAL HIGH (ref 70–99)
Glucose-Capillary: 171 mg/dL — ABNORMAL HIGH (ref 70–99)
Glucose-Capillary: 192 mg/dL — ABNORMAL HIGH (ref 70–99)
Glucose-Capillary: 212 mg/dL — ABNORMAL HIGH (ref 70–99)
Glucose-Capillary: 183 mg/dL — ABNORMAL HIGH (ref 70–99)

## 2018-12-24 LAB — CBC
HCT: 40 % (ref 36.0–46.0)
Hemoglobin: 12.8 g/dL (ref 12.0–15.0)
MCH: 26.5 pg (ref 26.0–34.0)
MCHC: 32 g/dL (ref 30.0–36.0)
MCV: 82.8 fL (ref 80.0–100.0)
Platelets: 283 10*3/uL (ref 150–400)
RBC: 4.83 MIL/uL (ref 3.87–5.11)
RDW: 15 % (ref 11.5–15.5)
WBC: 22 10*3/uL — ABNORMAL HIGH (ref 4.0–10.5)
nRBC: 0 % (ref 0.0–0.2)

## 2018-12-24 LAB — PHOSPHORUS: Phosphorus: 2 mg/dL — ABNORMAL LOW (ref 2.5–4.6)

## 2018-12-24 LAB — TROPONIN I: Troponin I: 0.4 ng/mL (ref <0.03)

## 2018-12-24 MED ORDER — TORSEMIDE 20 MG PO TABS
20.0000 mg | ORAL_TABLET | Freq: Two times a day (BID) | ORAL | Status: DC
  Administered 2018-12-24 – 2018-12-26 (×5): 20 mg via ORAL
  Filled 2018-12-24 (×5): qty 1

## 2018-12-24 MED ORDER — PANTOPRAZOLE SODIUM 40 MG PO TBEC
40.0000 mg | DELAYED_RELEASE_TABLET | Freq: Every day | ORAL | Status: DC
  Administered 2018-12-24 – 2018-12-27 (×4): 40 mg via ORAL
  Filled 2018-12-24 (×4): qty 1

## 2018-12-24 MED ORDER — AMLODIPINE BESYLATE 10 MG PO TABS
10.0000 mg | ORAL_TABLET | Freq: Every day | ORAL | Status: DC
  Administered 2018-12-24 – 2018-12-28 (×5): 10 mg via ORAL
  Filled 2018-12-24 (×6): qty 1

## 2018-12-24 MED ORDER — CARVEDILOL 6.25 MG PO TABS
6.2500 mg | ORAL_TABLET | Freq: Two times a day (BID) | ORAL | Status: DC
  Administered 2018-12-24 – 2018-12-25 (×4): 6.25 mg via ORAL
  Filled 2018-12-24 (×5): qty 1

## 2018-12-24 MED ORDER — LABETALOL HCL 5 MG/ML IV SOLN
10.0000 mg | INTRAVENOUS | Status: DC | PRN
  Administered 2018-12-24 – 2018-12-26 (×3): 10 mg via INTRAVENOUS
  Filled 2018-12-24 (×2): qty 4

## 2018-12-24 MED ORDER — SODIUM CHLORIDE 0.9 % IV SOLN
1.0000 g | Freq: Three times a day (TID) | INTRAVENOUS | Status: DC
  Administered 2018-12-24 – 2018-12-28 (×13): 1 g via INTRAVENOUS
  Filled 2018-12-24 (×15): qty 1

## 2018-12-24 NOTE — Progress Notes (Signed)
Family Meeting Note  Advance Directive:yes  Today a meeting took place with the Patient.    The following clinical team members were present during this meeting:MD  The following were discussed:Patient's diagnosis:Malignant HTN, elevated troponin, sepsis, DM, Ch meningioma , plan of care discussed with the pt . Plan explained with daughter over phone   Patient's progosis: Unable to determine and Goals for treatment: Full Code  Additional follow-up to be provided:  Hospitalist, intensivist and cardiology   Time spent during discussion:17 min  Nicholes Mango, MD

## 2018-12-24 NOTE — Consult Note (Addendum)
Consultation Note Date: 12/24/2018   Patient Name: [name]  DOB: 01-Aug-1941  MRN: [id number]  Age / Sex: 78 y.o., female  PCP: [name], MD Referring Physician: Nicholes Mango, MD  Reason for Consultation: Establishing goals of care  HPI/Patient Profile: [name]  is a 78 y.o. female with a known history of insulin requiring diabetes mellitus, GERD, hyperlipidemia, hypertension, old history of stroke is brought into the ED for altered mental status via EMS.  Patient was vomiting and appeared more confused than her baseline at around 3 AM in the morning CT head with stable meningioma no acute findings.  Blood pressure was very high at 209/70.   Clinical Assessment and Goals of Care: Patient is resting in bed. She states she has pain everywhere. She states sh is married and has 2 children. She is not able to answer questions further.   Spoke with her husband via the phone. He states he has tried to take the best care possible of her. He states he has to perform her ADL's and feed her. He states she will drink Ensure, but only eats bites of food. He states she has lost a lot of weight but cannot state how much or in what time. He states she doe not talk much, and complains of pain. He states "she needs field nurses to come check her water", as this would allow tx of her UTI's more quickly, and at home.     We discussed her diagnosis, prognosis, and GOC.   The difference between an aggressive medical intervention path and a comfort care path was discussed.  Values and goals of care important to patient and family were attempted to be elicited.  Discussed limitations of medical interventions to prolong quality of life in some situations and discussed the concept of human mortality.  He states he understands she is declining. We discussed suffering. He states to "let her survive as long as we can. I  don't want to do anything to hasten her death." He states God is in control of all this. He states he wants to see if she gets better. If she does not improve, he will further consider plans with care moving forward. He states as long as she is "some better" he wants to do what can be done to keep her alive. We discussed code status. He gave this thought and stated he would need to speak to the children.   SUMMARY OF RECOMMENDATIONS   Will continue to follow.  Full scope/full code.  Recommend palliative at D/C.    Prognosis:   < 6 months  Discharge Planning: To Be Determined      Primary Diagnoses: Present on Admission: . HTN (hypertension), malignant   I have reviewed the medical record, interviewed the patient and family, and examined the patient. The following aspects are pertinent.  Past Medical History:  Diagnosis Date  . Acute heart failure ([place])   . Aortic stenosis   . CHF (congestive heart failure) ([place])   .  Complication of anesthesia    Hard to wake patient up after having anesthesia  . Diabetes mellitus without complication ([place])   . Diabetic neuropathy (HCC)    Takes Lyrica  . Edema of both legs    Takes Lasix  . GERD (gastroesophageal reflux disease)   . High cholesterol   . HTN (hypertension)   . Hypertension   . PAD (peripheral artery disease) ([place])   . Shortness of breath dyspnea   . Spasm of back muscles   . Stroke ([place])   . Wound, open    Right great toe   Social History   Socioeconomic History  . Marital status: Married    Spouse name: Not on file  . Number of children: Not on file  . Years of education: Not on file  . Highest education level: Not on file  Occupational History  . Not on file  Social Needs  . Financial resource strain: Not on file  . Food insecurity:    Worry: Not on file    Inability: Not on file  . Transportation needs:    Medical: Not on file    Non-medical: Not on file  Tobacco Use  . Smoking status: Never Smoker   . Smokeless tobacco: Never Used  . Tobacco comment: Never smoked  Substance and Sexual Activity  . Alcohol use: No    Alcohol/week: 0.0 standard drinks  . Drug use: No  . Sexual activity: Never  Lifestyle  . Physical activity:    Days per week: Not on file    Minutes per session: Not on file  . Stress: Not on file  Relationships  . Social connections:    Talks on phone: Not on file    Gets together: Not on file    Attends religious service: Not on file    Active member of club or organization: Not on file    Attends meetings of clubs or organizations: Not on file    Relationship status: Not on file  Other Topics Concern  . Not on file  Social History Narrative  . Not on file   Family History  Problem Relation Age of Onset  . Diabetes Other   . Liver disease Mother   . CVA Father   . Diabetes Father   . Hypertension Father    Scheduled Meds: . amLODipine  10 mg Oral Daily  . carvedilol  6.25 mg Oral BID WC  . Chlorhexidine Gluconate Cloth  6 each Topical Q0600  . enoxaparin (LOVENOX) injection  40 mg Subcutaneous Q24H  . insulin aspart  0-9 Units Subcutaneous Q4H  . insulin glargine  6 Units Subcutaneous QHS  . ipratropium-albuterol  3 mL Nebulization BID  . mupirocin ointment  1 application Nasal BID  . pantoprazole  40 mg Oral QHS  . sodium chloride flush  10-40 mL Intracatheter Q12H  . torsemide  20 mg Oral BID   Continuous Infusions: . meropenem (MERREM) IV Stopped (12/24/18 0419)   PRN Meds:.acetaminophen, hydrALAZINE, labetalol, LORazepam, nitroGLYCERIN, ondansetron (ZOFRAN) IV, phenol, promethazine, sodium chloride flush Medications Prior to Admission:  Prior to Admission medications   Medication Sig Start Date End Date Taking? Authorizing Provider  acetaminophen (TYLENOL) 500 MG tablet Take 1,000 mg by mouth every 6 (six) hours as needed for mild pain.   Yes [provider]  albuterol (PROVENTIL HFA;VENTOLIN HFA) 108 (90 Base) MCG/ACT inhaler  Inhale 2 puffs into the lungs every 6 (six) hours as needed for wheezing or shortness of  breath.    Yes [provider]  amLODipine (NORVASC) 10 MG tablet Take 10 mg by mouth daily.   Yes [provider]  aspirin 325 MG tablet Take 1 tablet (325 mg total) by mouth daily with breakfast. Patient taking differently: Take 325 mg by mouth daily.  07/10/16  Yes Holley Raring, MD  atorvastatin (LIPITOR) 20 MG tablet Take 1 tablet (20 mg total) by mouth daily. 03/13/16  Yes Virgina Jock A, PA-C  budesonide (PULMICORT) 0.25 MG/2ML nebulizer solution Take 2 mLs (0.25 mg total) by nebulization 2 (two) times daily. 06/10/18  Yes Dustin Flock, MD  carvedilol (COREG) 6.25 MG tablet Take 6.25 mg by mouth 2 (two) times daily.    Yes [provider]  cetirizine (ZYRTEC) 10 MG tablet Take 10 mg by mouth daily.    Yes [provider]  cholecalciferol (VITAMIN D) 1000 units tablet Take 2,000 Units by mouth daily.    Yes [provider]  clopidogrel (PLAVIX) 75 MG tablet Take 1 tablet (75 mg total) by mouth daily with breakfast. 03/04/17  Yes Conrad Yaphank, MD  docusate sodium (COLACE) 100 MG capsule Take 100 mg by mouth 2 (two) times daily.   Yes [provider]  fluticasone (FLONASE) 50 MCG/ACT nasal spray Place 1 spray into both nostrils every evening.    Yes [provider]  insulin glargine (LANTUS) 100 UNIT/ML injection Inject 0.27 mLs (27 Units total) into the skin at bedtime. Patient taking differently: Inject 20 Units into the skin at bedtime.  09/16/16  Yes Reyne Dumas, MD  ipratropium-albuterol (DUONEB) 0.5-2.5 (3) MG/3ML SOLN Take 3 mLs by nebulization 2 (two) times daily. 06/10/18  Yes Dustin Flock, MD  isosorbide dinitrate (ISORDIL) 30 MG tablet Take 30 mg by mouth daily.  04/16/17  Yes [provider]  modafinil (PROVIGIL) 100 MG tablet Take 2 tablets (200 mg total) by mouth daily. 06/11/18  Yes Dustin Flock, MD  nitroGLYCERIN  (NITROSTAT) 0.4 MG SL tablet Place 1 tablet (0.4 mg total) under the tongue every 5 (five) minutes as needed for chest pain. 07/09/16  Yes Holley Raring, MD  omeprazole (PRILOSEC) 20 MG capsule Take 1 capsule (20 mg total) by mouth daily. 04/17/18  Yes Sainani, Belia Heman, MD  polyethylene glycol (MIRALAX / GLYCOLAX) packet Take 17 g by mouth 2 (two) times daily. 02/09/14  Yes Thurnell Lose, MD  potassium chloride SA (K-DUR,KLOR-CON) 20 MEQ tablet Take 20 mEq by mouth daily.   Yes [provider]  torsemide (DEMADEX) 20 MG tablet Take 2 tablets (40 mg total) by mouth 2 (two) times daily. 04/17/18  Yes Henreitta Leber, MD   Allergies  Allergen Reactions  . Iodine Anaphylaxis  . Penicillins Anaphylaxis    Tolerates ceftriaxone, cefazolin Has patient had a PCN reaction causing immediate rash, facial/tongue/throat swelling, SOB or lightheadedness with hypotension: Unknown Has patient had a PCN reaction causing severe rash involving mucus membranes or skin necrosis: Unknown Has patient had a PCN reaction that required hospitalization: Unknown Has patient had a PCN reaction occurring within the last 10 years: Unknown If all of the above answers are "NO", then may proceed with Cephalosporin u  . Shellfish Allergy Anaphylaxis  . Shellfish-Derived Products Anaphylaxis  . Sulfa Antibiotics Anaphylaxis  . Sulfacetamide Sodium Anaphylaxis  . Sulfasalazine Anaphylaxis  . Eggs Or Egg-Derived Products     Other reaction(s): SHORTNESS OF BREATH  . Morphine And Related Other (See Comments)    Altered mental status  Review of Systems  Musculoskeletal:       Complains of generalized pain    Physical Exam Pulmonary:     Effort: Pulmonary effort is normal.  Neurological:     Mental Status: She is alert.     Vital Signs: BP (!) 154/56   Pulse 86   Temp 98 F (36.7 C) (Oral)   Resp 15   Ht 5\' 6"  (1.676 m)   Wt 77.6 kg   SpO2 94%   BMI 27.61 kg/m  Pain Scale: CPOT   Pain Score: 5     SpO2: SpO2: 94 % O2 Device:SpO2: 94 % O2 Flow Rate: .O2 Flow Rate (L/min): 2 L/min  IO: Intake/output summary:   Intake/Output Summary (Last 24 hours) at 12/24/2018 1436 Last data filed at 12/24/2018 0700 Gross per 24 hour  Intake 627.48 ml  Output 300 ml  Net 327.48 ml    LBM: Last BM Date: (PTA) Baseline Weight: Weight: 90.7 kg Most recent weight: Weight: 77.6 kg     Palliative Assessment/Data:     Time In: 1:40 Time Out: 2:50 Time Total: 70 min Greater than 50%  of this time was spent counseling and coordinating care related to the above assessment and plan.  Signed by: Asencion Gowda, NP   Please contact Palliative Medicine Team phone at [phone] for questions and concerns.  For individual provider: See Shea Evans

## 2018-12-24 NOTE — TOC Initial Note (Signed)
Transition of White [name]) - Initial/Assessment Note    Patient Details  Name: [name] MRN: [id number] Date of Birth: 10/26/1940  Transition of White [name]) CM/SW Contact:    [name] Maudlin, RN Phone Number: 12/24/2018, 1:46 PM  Clinical Narrative:   [name] team consulted to complete high risk assessment and establish disposition plans. Patient still un oriented, spoke mainly with son [name] to complete assessment. Patient lives with her spouse [name], per [name] he and his sister are available for support and are required to assist a lot since [name] struggles to provide all of [name] needed White for [name] patient on his own. At baseline, patient is able to transfer and take a few steps per [name] but unable to ambulate long distances. Patient has White bed, hoyer lift, chronic O2 through [employer], trilogy machine, walker, bedside commode, shower chair and wheelchair all in home. Patient recently closed with [name] and [name] feels it was beneficial for his mother and they would like to pursue this again if appropriate. Palliative White is consulted for goals of White. Notified [name] from [name] that there may be need for home health. TOC team to continue to follow.                 Expected Discharge Plan: [name] Barriers to Discharge: Continued Medical Work up   Patient Goals and CMS Choice   CMS Medicare.gov Compare Post Acute White list provided to:: Patient Represenative (must comment)(son [name]) Choice offered to / list presented to : Adult Children  Expected Discharge Plan and Services Expected Discharge Plan: [place]   Discharge Planning Services: CM Consult Post Acute White Choice: [place] arrangements for [name] past 2 months: Single Family Home                           HH Arranged: PT, Nurse's Aide, RN [name] Agency: [facility] ([facility]) Date HH Agency Contacted: 12/24/18 Time [place]:  [phone] Representative spoke with at [place]: [place] Arrangements/Services Living arrangements for [name] past 2 months: [place] Lives with:: Spouse Patient language and need for interpreter reviewed:: Yes Do you feel safe going back to [name] place where you live?: Yes      Need for Family Participation in Patient White: No (Comment)   Current home services: DME Criminal Activity/Legal Involvement Pertinent to Current Situation/Hospitalization: No - Comment as needed  Activities of Daily Living      Permission Sought/Granted Permission sought to share information with : Case Manager, Family Supports                Emotional Assessment Appearance:: Appears older than stated age Attitude/Demeanor/Rapport: Unable to Assess Affect (typically observed): Unable to Assess Orientation: : Oriented to Self      Admission diagnosis:  SIRS (systemic inflammatory response syndrome) (HCC) [R65.10] Acute cystitis with hematuria [N30.01] Elevated troponin I level [R79.89] Altered mental status, unspecified altered mental status type [R41.82] HTN (hypertension), malignant [I10] Patient Active Problem List   Diagnosis Date Noted  . HTN (hypertension), malignant 12/23/2018  . Healthcare-associated pneumonia 06/03/2018  . Acute cystitis 04/10/2018  . Sepsis due to Streptococcus, group B ([place])   . Multiple open wounds of lower leg   . PVD (peripheral vascular disease) ([place])   . Bacteremia 08/31/2016  . Sepsis ([place]) 08/29/2016  . Benign essential HTN   . Acute  on chronic diastolic heart failure ([place])   . Ulcer of right lower extremity ([place])   . Chest pain 07/05/2016  . Impacted cerumen of both ears 06/05/2016  . 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 Pleural effusion, bilateral   . Wheeze   . Dyspnea   . CHF (congestive heart failure) ([place])   . Right sided weakness   . Acute respiratory acidosis   . Surgery, elective   . Anemia, iron  deficiency   . Leukocytosis   . Cerebrovascular accident (CVA) due to thrombosis of precerebral artery ([place]) 03/01/2016  . 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 PAD (peripheral artery disease) ([place]) 02/28/2016  . Atherosclerotic peripheral vascular disease with intermittent claudication ([place]) 12/08/2015  . Open toe wound 05/04/2014  . Anemia, chronic disease 04/27/2014  . Occult blood positive stool 04/17/2014  . Edema 04/03/2014  . Unspecified hereditary and idiopathic peripheral neuropathy 03/14/2014  . DM2 (diabetes mellitus, type 2) ([place]) 02/14/2014  . Altered mental status 02/12/2014  . Acute encephalopathy 02/12/2014  . Aortic stenosis 02/04/2014  . Closed left subtrochanteric femur fracture ([place]) 02/03/2014  . Hip fracture, left ([place]) 02/03/2014  . Hypokalemia 02/03/2014  . Fibula fracture 02/03/2014  . MTP instability 02/03/2014  . Hip fracture ([place]) 02/03/2014  . HTN (hypertension)   . HLD (hyperlipidemia)    PCP:  [name] Richards, MD Pharmacy:   [facility] [place] [address] [place] [address] Phone: [phone] Fax: [phone]     Social Determinants of Health (SDOH) Interventions    Readmission Risk Interventions Readmission Risk Prevention Plan 12/24/2018  [place] or Home White Consult Complete  Some recent data might be hidden

## 2018-12-24 NOTE — Progress Notes (Signed)
Pharmacy Electrolyte Monitoring Consult:  Pharmacy consulted to assist in monitoring and replacing electrolytes in this 78 y.o. female admitted on 12/23/2018 with Altered Mental Status   Labs:  Sodium (mmol/L)  Date Value  12/24/2018 139   Potassium (mmol/L)  Date Value  12/24/2018 3.9   Magnesium (mg/dL)  Date Value  12/24/2018 2.1   Phosphorus (mg/dL)  Date Value  12/24/2018 2.0 (L)   Calcium (mg/dL)  Date Value  12/24/2018 9.3   Albumin (g/dL)  Date Value  12/23/2018 2.9 (L)    Assessment/Plan: No replacement warranted.   Will recheck electrolytes with am labs.

## 2018-12-24 NOTE — Progress Notes (Signed)
PHARMACIST - PHYSICIAN COMMUNICATION  CONCERNING: IV to Oral Route Change Policy  RECOMMENDATION: This patient is receiving pantoprazole by the intravenous route.  Based on criteria approved by the Pharmacy and Therapeutics Committee, the intravenous medication(s) is/are being converted to the equivalent oral dose form(s).   DESCRIPTION: These criteria include:  The patient is eating (either orally or via tube) and/or has been taking other orally administered medications for a least 24 hours  The patient has no evidence of active gastrointestinal bleeding or impaired GI absorption (gastrectomy, short bowel, patient on TNA or NPO).  If you have questions about this conversion, please contact the Pharmacy Department   []   [phone] )  [employer] 12/24/2018 2:18 PM

## 2018-12-24 NOTE — Progress Notes (Signed)
PHARMACY - PHYSICIAN COMMUNICATION CRITICAL VALUE ALERT - BLOOD CULTURE IDENTIFICATION (BCID)  [name] is an 78 y.o. female who presented to [facility] on 12/23/2018 with a chief complaint of AMS  Assessment:  PCT 0.97, LA 1.2 >> 1.4, WBC 22.1 >> 22.0, CXR cardiomegaly, CT positive for meningioma but no acute process, repeat CXR edema vs. Atelectasis vs. Atypical infx, UA nitrite +, leukocytes +, bacteria many. 2/4 GNR BCID Enterobacteriaceae E. Coli [facility]-  Name of physician (or Provider) Contacted: Darel Hong  Current antibiotics: Ceftriaxone 1g q24h  Changes to prescribed antibiotics recommended:  Recommendations accepted by provider -- switched to meropenem 1g IV q8h for potential ESBL E. Coli  Results for orders placed or performed during the hospital encounter of 12/23/18  Blood Culture ID Panel (Reflexed) (Collected: 12/23/2018  1:00 PM)  Result Value Ref Range   Enterococcus species NOT DETECTED NOT DETECTED   Listeria monocytogenes NOT DETECTED NOT DETECTED   Staphylococcus species NOT DETECTED NOT DETECTED   Staphylococcus aureus (BCID) NOT DETECTED NOT DETECTED   Streptococcus species NOT DETECTED NOT DETECTED   Streptococcus agalactiae NOT DETECTED NOT DETECTED   Streptococcus pneumoniae NOT DETECTED NOT DETECTED   Streptococcus pyogenes NOT DETECTED NOT DETECTED   Acinetobacter baumannii NOT DETECTED NOT DETECTED   Enterobacteriaceae species DETECTED (A) NOT DETECTED   Enterobacter cloacae complex NOT DETECTED NOT DETECTED   Escherichia coli DETECTED (A) NOT DETECTED   Klebsiella oxytoca NOT DETECTED NOT DETECTED   Klebsiella pneumoniae NOT DETECTED NOT DETECTED   Proteus species NOT DETECTED NOT DETECTED   Serratia marcescens NOT DETECTED NOT DETECTED   Carbapenem resistance NOT DETECTED NOT DETECTED   Haemophilus influenzae NOT DETECTED NOT DETECTED   Neisseria meningitidis NOT DETECTED NOT DETECTED   Pseudomonas aeruginosa NOT DETECTED NOT DETECTED   Candida albicans NOT DETECTED NOT DETECTED   Candida glabrata NOT DETECTED NOT DETECTED   Candida krusei NOT DETECTED NOT DETECTED   Candida parapsilosis NOT DETECTED NOT DETECTED   Candida tropicalis NOT DETECTED NOT DETECTED   Tobie Lords, PharmD, BCPS Clinical Pharmacist 12/24/2018

## 2018-12-24 NOTE — Consult Note (Signed)
[facility] Consultation Note  Patient ID: [name], MRN: [id number], DOB/AGE: 1941/04/18 78 y.o. Admit date: 12/23/2018   Date of Consult: 12/24/2018 Primary Physician: [name], MD Primary Cardiologist: Nehemiah Massed  Chief Complaint:  Chief Complaint  Patient presents with  . Altered Mental Status   Reason for Consult: Elevated troponin and malignant hypertension  HPI: 78 y.o. female with known aortic valve stenosis peripheral vascular disease with previous stroke and significant hypertension diabetes with complication previously on appropriate medication management for treatment of these issues.  The patient has had new onset of significant shortness of breath weakness fatigue and altered mental state.  It has been found that the patient does have a urinary tract infection for which may have caused her altered mental state.  In addition to that the patient has had an EKG showing normal sinus rhythm with nonspecific ST and T wave changes.  Troponin level has been 0.4 more consistent with demand ischemia rather than acute coronary syndrome.  She has had some improvements of mental status although still somewhat confused this morning.  Chest x-ray has showed cardiomegaly but no evidence of congestive heart failure.  Blood pressure was significantly elevated on admission but now slightly improved.  The patient is hemodynamically stable at this time  Past Medical History:  Diagnosis Date  . Acute heart failure ([place])   . Aortic stenosis   . CHF (congestive heart failure) ([place])   . Complication of anesthesia    Hard to wake patient up after having anesthesia  . Diabetes mellitus without complication ([place])   . Diabetic neuropathy ([facility])    Takes Lyrica  . Edema of both legs    Takes Lasix  . GERD (gastroesophageal reflux disease)   . High cholesterol   . HTN (hypertension)   . Hypertension   . PAD (peripheral artery disease) ([place])   . Shortness of breath dyspnea   .  Spasm of back muscles   . Stroke ([place])   . Wound, open    Right great toe      Surgical History:  Past Surgical History:  Procedure Laterality Date  . BYPASS GRAFT POPLITEAL TO TIBIAL Right 02/28/2016   Procedure: BYPASS GRAFT RIGHT BELOW KNEE POPLITEAL TO PERONEAL USING REVERSED RIGHT GREATER SAPHENOUS VEIN;  Surgeon: Conrad Clackamas, MD;  Location: [place];  Service: Vascular;  Laterality: Right;  . CYST EXCISION     Abdomen  . EYE SURGERY Bilateral    Cataract removal  . INTRAMEDULLARY (IM) NAIL INTERTROCHANTERIC Left 02/04/2014   Procedure: INTRAMEDULLARY (IM) NAIL INTERTROCHANTRIC FEMORAL;  Surgeon: Mauri Pole, MD;  Location: [place];  Service: Orthopedics;  Laterality: Left;  . IR GENERIC HISTORICAL  03/01/2016   IR ANGIO INTRA EXTRACRAN SEL COM CAROTID INNOMINATE UNI R MOD SED 03/01/2016 [name], MD [facility]  . IR GENERIC HISTORICAL  03/01/2016   IR ENDOVASC INTRACRANIAL INF OTHER THAN THROMBO ART INC DIAG ANGIO 03/01/2016 [name], MD [facility]  . IR GENERIC HISTORICAL  03/01/2016   IR INTRAVSC STENT CERV CAROTID W/O EMB-PROT MOD SED INC ANGIO 03/01/2016 [name], MD [facility]  . IR GENERIC HISTORICAL  06/03/2016   IR RADIOLOGIST EVAL & MGMT 06/03/2016 [facility]  . ORIF TOE FRACTURE Right 02/08/2014   Procedure: OPEN REDUCTION INTERNAL FIXATION Right METATARSAL  FRACTURE ;  Surgeon: Wylene Simmer, MD;  Location: [facility];  Service: Orthopedics;  Laterality: Right;  . PERIPHERAL VASCULAR CATHETERIZATION N/A 12/28/2015   Procedure:  Abdominal Aortogram w/Lower Extremity;  Surgeon: Conrad [place], MD;  Location: [facility];  Service: Cardiovascular;  Laterality: N/A;  . RADIOLOGY WITH ANESTHESIA N/A 03/01/2016   Procedure: RADIOLOGY WITH ANESTHESIA;  Surgeon: [name], MD;  Location: [place];  Service: Radiology;  Laterality: N/A;  . VEIN HARVEST Right 02/28/2016   Procedure: RIGHT GREATER SAPHENOUS VEIN HARVEST;  Surgeon: Conrad Garwin, MD;   Location: [place];  Service: Vascular;  Laterality: Right;     Home Meds: Prior to Admission medications   Medication Sig Start Date End Date Taking? Authorizing Provider  acetaminophen (TYLENOL) 500 MG tablet Take 1,000 mg by mouth every 6 (six) hours as needed for mild pain.   Yes [provider]  albuterol (PROVENTIL HFA;VENTOLIN HFA) 108 (90 Base) MCG/ACT inhaler Inhale 2 puffs into the lungs every 6 (six) hours as needed for wheezing or shortness of breath.    Yes [provider]  amLODipine (NORVASC) 10 MG tablet Take 10 mg by mouth daily.   Yes [provider]  aspirin 325 MG tablet Take 1 tablet (325 mg total) by mouth daily with breakfast. Patient taking differently: Take 325 mg by mouth daily.  07/10/16  Yes Holley Raring, MD  atorvastatin (LIPITOR) 20 MG tablet Take 1 tablet (20 mg total) by mouth daily. 03/13/16  Yes Virgina Jock A, PA-C  budesonide (PULMICORT) 0.25 MG/2ML nebulizer solution Take 2 mLs (0.25 mg total) by nebulization 2 (two) times daily. 06/10/18  Yes Dustin Flock, MD  carvedilol (COREG) 6.25 MG tablet Take 6.25 mg by mouth 2 (two) times daily.    Yes [provider]  cetirizine (ZYRTEC) 10 MG tablet Take 10 mg by mouth daily.    Yes [provider]  cholecalciferol (VITAMIN D) 1000 units tablet Take 2,000 Units by mouth daily.    Yes [provider]  clopidogrel (PLAVIX) 75 MG tablet Take 1 tablet (75 mg total) by mouth daily with breakfast. 03/04/17  Yes Conrad [place], MD  docusate sodium (COLACE) 100 MG capsule Take 100 mg by mouth 2 (two) times daily.   Yes [provider]  fluticasone (FLONASE) 50 MCG/ACT nasal spray Place 1 spray into both nostrils every evening.    Yes [provider]  insulin glargine (LANTUS) 100 UNIT/ML injection Inject 0.27 mLs (27 Units total) into the skin at bedtime. Patient taking differently: Inject 20 Units into the skin at bedtime.  09/16/16  Yes Reyne Dumas, MD   ipratropium-albuterol (DUONEB) 0.5-2.5 (3) MG/3ML SOLN Take 3 mLs by nebulization 2 (two) times daily. 06/10/18  Yes Dustin Flock, MD  isosorbide dinitrate (ISORDIL) 30 MG tablet Take 30 mg by mouth daily.  04/16/17  Yes [provider]  modafinil (PROVIGIL) 100 MG tablet Take 2 tablets (200 mg total) by mouth daily. 06/11/18  Yes Dustin Flock, MD  nitroGLYCERIN (NITROSTAT) 0.4 MG SL tablet Place 1 tablet (0.4 mg total) under the tongue every 5 (five) minutes as needed for chest pain. 07/09/16  Yes Holley Raring, MD  omeprazole (PRILOSEC) 20 MG capsule Take 1 capsule (20 mg total) by mouth daily. 04/17/18  Yes Sainani, Belia Heman, MD  polyethylene glycol (MIRALAX / GLYCOLAX) packet Take 17 g by mouth 2 (two) times daily. 02/09/14  Yes Thurnell Lose, MD  potassium chloride SA (K-DUR,KLOR-CON) 20 MEQ tablet Take 20 mEq by mouth daily.   Yes [provider]  torsemide (DEMADEX) 20 MG tablet Take 2 tablets (40 mg total) by mouth 2 (  two) times daily. 04/17/18  Yes Henreitta Leber, MD    Inpatient Medications:  . Chlorhexidine Gluconate Cloth  6 each Topical Q0600  . enoxaparin (LOVENOX) injection  40 mg Subcutaneous Q24H  . insulin aspart  0-9 Units Subcutaneous Q4H  . insulin glargine  6 Units Subcutaneous QHS  . ipratropium-albuterol  3 mL Nebulization BID  . mupirocin ointment  1 application Nasal BID  . pantoprazole (PROTONIX) IV  40 mg Intravenous Q24H  . sodium chloride flush  10-40 mL Intracatheter Q12H   . meropenem (MERREM) IV Stopped (12/24/18 0419)  . niCARDipine Stopped (12/24/18 4967)    Allergies:  Allergies  Allergen Reactions  . Iodine Anaphylaxis  . Penicillins Anaphylaxis    Tolerates ceftriaxone, cefazolin Has patient had a PCN reaction causing immediate rash, facial/tongue/throat swelling, SOB or lightheadedness with hypotension: Unknown Has patient had a PCN reaction causing severe rash involving mucus membranes or skin necrosis: Unknown Has  patient had a PCN reaction that required hospitalization: Unknown Has patient had a PCN reaction occurring within the last 10 years: Unknown If all of the above answers are "NO", then may proceed with Cephalosporin u  . Shellfish Allergy Anaphylaxis  . Shellfish-Derived Products Anaphylaxis  . Sulfa Antibiotics Anaphylaxis  . Sulfacetamide Sodium Anaphylaxis  . Sulfasalazine Anaphylaxis  . Eggs Or Egg-Derived Products     Other reaction(s): SHORTNESS OF BREATH  . Morphine And Related Other (See Comments)    Altered mental status    Social History   Socioeconomic History  . Marital status: Married    Spouse name: Not on file  . Number of children: Not on file  . Years of education: Not on file  . Highest education level: Not on file  Occupational History  . Not on file  Social Needs  . Financial resource strain: Not on file  . Food insecurity:    Worry: Not on file    Inability: Not on file  . Transportation needs:    Medical: Not on file    Non-medical: Not on file  Tobacco Use  . Smoking status: Never Smoker  . Smokeless tobacco: Never Used  . Tobacco comment: Never smoked  Substance and Sexual Activity  . Alcohol use: No    Alcohol/week: 0.0 standard drinks  . Drug use: No  . Sexual activity: Never  Lifestyle  . Physical activity:    Days per week: Not on file    Minutes per session: Not on file  . Stress: Not on file  Relationships  . Social connections:    Talks on phone: Not on file    Gets together: Not on file    Attends religious service: Not on file    Active member of club or organization: Not on file    Attends meetings of clubs or organizations: Not on file    Relationship status: Not on file  . Intimate partner violence:    Fear of current or ex partner: Not on file    Emotionally abused: Not on file    Physically abused: Not on file    Forced sexual activity: Not on file  Other Topics Concern  . Not on file  Social History Narrative  . Not  on file     Family History  Problem Relation Age of Onset  . Diabetes Other   . Liver disease Mother   . CVA Father   . Diabetes Father   . Hypertension Father      Review of  Systems The patient cannot converse well enough to assess Labs: Recent Labs    12/23/18 1113 12/23/18 1300 12/23/18 1830 12/24/18 0044  TROPONINI 0.21* 0.25* 0.33* 0.40*   Lab Results  Component Value Date   WBC 22.0 (H) 12/24/2018   HGB 12.8 12/24/2018   HCT 40.0 12/24/2018   MCV 82.8 12/24/2018   PLT 283 12/24/2018    Recent Labs  Lab 12/23/18 1540 12/24/18 0044  NA 139 139  K 3.8 3.9  CL 101 102  CO2 27 25  BUN 12 12  CREATININE 0.57 0.53  CALCIUM 9.4 9.3  PROT 6.8  --   BILITOT 0.6  --   ALKPHOS 69  --   ALT 9  --   AST 11*  --   GLUCOSE 290* 193*   Lab Results  Component Value Date   CHOL 158 04/13/2018   HDL 35 (L) 04/13/2018   LDLCALC 101 (H) 04/13/2018   TRIG 109 04/13/2018   No results found for: DDIMER  Radiology/Studies:  Dg Chest 1 View  Result Date: 12/23/2018 CLINICAL DATA:  Altered mental status.  Hypertension. EXAM: CHEST  1 VIEW COMPARISON:  July 18, 2018 FINDINGS: There is no appreciable edema or consolidation. There is cardiomegaly with pulmonary vascularity normal. No adenopathy. No bone lesions. IMPRESSION: Cardiomegaly.  No edema or consolidation. Electronically Signed   By: Lowella Grip III M.D.   On: 12/23/2018 09:26   Ct Head Wo Contrast  Result Date: 12/23/2018 CLINICAL DATA:  Altered mental status EXAM: CT HEAD WITHOUT CONTRAST TECHNIQUE: Contiguous axial images were obtained from the base of the skull through the vertex without intravenous contrast. COMPARISON:  07/18/2018 FINDINGS: Brain: Chronic atrophic and ischemic changes are again identified and stable. No findings to suggest acute hemorrhage or acute infarction are noted. Calcified meningioma at the base of the right posterior fossa is noted Vascular: No hyperdense vessel or unexpected  calcification. Skull: Normal. Negative for fracture or focal lesion. Sinuses/Orbits: No acute finding. Other: None. IMPRESSION: Stable right posterior cranial fossa meningioma. Stable atrophic and ischemic changes without acute abnormality. Electronically Signed   By: Inez Catalina M.D.   On: 12/23/2018 09:58   Dg Chest Port 1 View  Result Date: 12/23/2018 CLINICAL DATA:  Sepsis EXAM: PORTABLE CHEST 1 VIEW COMPARISON:  12/23/2018 FINDINGS: The cardiac silhouette is enlarged. There are short interval worsening of bibasilar airspace opacities favored to represent a combination of pleural fluid and atelectasis. An underlying infiltrate is difficult to exclude. There are prominent interstitial lung markings and scattered hazy airspace opacities. There is no pneumothorax. IMPRESSION: 1. Short interval worsening of bibasilar airspace opacities which may represent a combination of pleural fluid and atelectasis or infiltrates. 2. Cardiomegaly. 3. Hazy scattered airspace opacities which can be seen in patients with pulmonary edema or an atypical infectious process. Electronically Signed   By: Constance Holster M.D.   On: 12/23/2018 15:33    EKG: Normal sinus rhythm with nonspecific ST and T wave changes  Weights: Filed Weights   12/23/18 0854 12/23/18 1509  Weight: 90.7 kg 77.6 kg     Physical Exam: Blood pressure (!) 177/55, pulse 100, temperature 98 F (36.7 C), temperature source Oral, resp. rate (!) 25, height 5\' 6"  (1.676 m), weight 77.6 kg, SpO2 90 %. Body mass index is 27.61 kg/m. General: Well developed, well nourished, in no acute distress.  Although has some difficulty with mental status changes Head eyes ears nose throat: Normocephalic, atraumatic, sclera non-icteric, no xanthomas, nares are  without discharge. No apparent thyromegaly and/or mass  Lungs: Normal respiratory effort.  Few wheezes, no rales, no rhonchi.  Heart: RRR with normal S1 soft S2.  4+ right upper sternal border murmur  consistent with aortic valve stenosis as before gallop, no rub, PMI is normal size and placement, carotid upstroke normal without bruit, jugular venous pressure is normal Abdomen: Soft, non-tender,  distended with normoactive bowel sounds. No hepatomegaly. No rebound/guarding. No obvious abdominal masses. Abdominal aorta is normal size without bruit Extremities: Trace edema. no cyanosis, no clubbing, no ulcers  Peripheral : 2+ bilateral upper extremity pulses, 2+ bilateral femoral pulses, 2+ bilateral dorsal pedal pulse Neuro: Not alert and oriented. No facial asymmetry. No focal deficit. Moves all extremities spontaneously. Musculoskeletal: Normal muscle tone without kyphosis Psych: Does not responds to questions appropriately with a normal affect.    Assessment: 78 year old female with peripheral vascular disease diabetes previous stroke valvular heart disease with aortic valve stenosis and new onset altered mental status most consistent with urinary tract infection and malignant hypertension with elevated troponin consistent with demand ischemia rather than acute coronary syndrome and no current evidence of congestive heart failure  Plan: 1.  Continue supportive care and treatment for altered mental status including antibiotic treatment as needed 2.  Continue aggressive treatment of malignant hypertension possibly causing elevated troponin due to demand ischemia with continued intravenous medication management until patient can take oral medications then consider reinstatement of carvedilol amlodipine combination as before 3.  Continue antiplatelet medication management for further risk reduction cardiovascular event 4.  Further consideration of echocardiogram as necessary for reevaluation of extent of aortic valve stenosis contributing to malignant hypertension and/or LVH although currently no evidence of myocardial infarction and/or heart failure symptoms 5.  Further treatment as necessary  after above  Signed, Corey Skains M.D. [facility] 12/24/2018, 9:01 AM

## 2018-12-24 NOTE — Progress Notes (Signed)
*  PRELIMINARY RESULTS* Echocardiogram 2D Echocardiogram has been performed.  [name] 12/24/2018, 12:29 PM

## 2018-12-24 NOTE — Progress Notes (Signed)
[facility] [facility] [place] NAME: [name]    MR#:  [id number]  DATE OF BIRTH:  1940-08-15  SUBJECTIVE:  CHIEF COMPLAINT:  Pt is feeling better.  Answering most of the questions  REVIEW OF SYSTEMS:  CONSTITUTIONAL: No fever, fatigue or weakness.  EYES: No blurred or double vision.  EARS, NOSE, AND THROAT: No tinnitus or ear pain.  RESPIRATORY: No cough, shortness of breath, wheezing or hemoptysis.  CARDIOVASCULAR: No chest pain, orthopnea, edema.  GASTROINTESTINAL: No nausea, vomiting, diarrhea or abdominal pain.  HEMATOLOGY: No anemia, easy bruising or bleeding SKIN: No rash or lesion. MUSCULOSKELETAL: No joint pain or arthritis.   NEUROLOGIC: No tingling, numbness, weakness.  PSYCHIATRY: No anxiety or depression.   DRUG ALLERGIES:   Allergies  Allergen Reactions  . Iodine Anaphylaxis  . Penicillins Anaphylaxis    Tolerates ceftriaxone, cefazolin Has patient had a PCN reaction causing immediate rash, facial/tongue/throat swelling, SOB or lightheadedness with hypotension: Unknown Has patient had a PCN reaction causing severe rash involving mucus membranes or skin necrosis: Unknown Has patient had a PCN reaction that required hospitalization: Unknown Has patient had a PCN reaction occurring within the last 10 years: Unknown If all of the above answers are "NO", then may proceed with Cephalosporin u  . Shellfish Allergy Anaphylaxis  . Shellfish-Derived Products Anaphylaxis  . Sulfa Antibiotics Anaphylaxis  . Sulfacetamide Sodium Anaphylaxis  . Sulfasalazine Anaphylaxis  . Eggs Or Egg-Derived Products     Other reaction(s): SHORTNESS OF BREATH  . Morphine And Related Other (See Comments)    Altered mental status    VITALS:  Blood pressure (!) 145/53, pulse 86, temperature 98 F (36.7 C), temperature source Oral, resp. rate 18, height 5' 6" (1.676 m), weight 77.6 kg, SpO2 91 %.  PHYSICAL EXAMINATION:  GENERAL:  78  y.o.-year-old patient lying in the bed with no acute distress.  EYES: Pupils equal, round, reactive to light and accommodation. No scleral icterus. Extraocular muscles intact.  HEENT: Head atraumatic, normocephalic. Oropharynx and nasopharynx clear.  NECK:  Supple, no jugular venous distention. No thyroid enlargement, no tenderness.  LUNGS: Normal breath sounds bilaterally, no wheezing, rales,rhonchi or crepitation. No use of accessory muscles of respiration.  CARDIOVASCULAR: S1, S2 normal. No murmurs, rubs, or gallops.  ABDOMEN: Soft, nontender, nondistended. Bowel sounds present. No organomegaly or mass.  EXTREMITIES: No pedal edema, cyanosis, or clubbing.  NEUROLOGIC: Awake alert, oriented x2-3 sensation intact. Gait not checked.  PSYCHIATRIC: The patient is alert and oriented x2- 3.  SKIN: No obvious rash, lesion, or ulcer.    LABORATORY PANEL:   CBC Recent Labs  Lab 12/24/18 0044  WBC 22.0*  HGB 12.8  HCT 40.0  PLT 283   ------------------------------------------------------------------------------------------------------------------  Chemistries  Recent Labs  Lab 12/23/18 1540 12/24/18 0044  NA 139 139  K 3.8 3.9  CL 101 102  CO2 27 25  GLUCOSE 290* 193*  BUN 12 12  CREATININE 0.57 0.53  CALCIUM 9.4 9.3  MG  --  2.1  AST 11*  --   ALT 9  --   ALKPHOS 69  --   BILITOT 0.6  --    ------------------------------------------------------------------------------------------------------------------  Cardiac Enzymes Recent Labs  Lab 12/24/18 0044  TROPONINI 0.40*   ------------------------------------------------------------------------------------------------------------------  RADIOLOGY:  Dg Chest 1 View  Result Date: 12/23/2018 CLINICAL DATA:  Altered mental status.  Hypertension. EXAM: CHEST  1 VIEW COMPARISON:  July 18, 2018 FINDINGS: There is no appreciable edema or consolidation. There  is cardiomegaly with pulmonary vascularity normal. No adenopathy.  No bone lesions. IMPRESSION: Cardiomegaly.  No edema or consolidation. Electronically Signed   By: Lowella Grip III M.D.   On: 12/23/2018 09:26   Ct Head Wo Contrast  Result Date: 12/23/2018 CLINICAL DATA:  Altered mental status EXAM: CT HEAD WITHOUT CONTRAST TECHNIQUE: Contiguous axial images were obtained from the base of the skull through the vertex without intravenous contrast. COMPARISON:  07/18/2018 FINDINGS: Brain: Chronic atrophic and ischemic changes are again identified and stable. No findings to suggest acute hemorrhage or acute infarction are noted. Calcified meningioma at the base of the right posterior fossa is noted Vascular: No hyperdense vessel or unexpected calcification. Skull: Normal. Negative for fracture or focal lesion. Sinuses/Orbits: No acute finding. Other: None. IMPRESSION: Stable right posterior cranial fossa meningioma. Stable atrophic and ischemic changes without acute abnormality. Electronically Signed   By: Inez Catalina M.D.   On: 12/23/2018 09:58   Dg Chest Port 1 View  Result Date: 12/23/2018 CLINICAL DATA:  Sepsis EXAM: PORTABLE CHEST 1 VIEW COMPARISON:  12/23/2018 FINDINGS: The cardiac silhouette is enlarged. There are short interval worsening of bibasilar airspace opacities favored to represent a combination of pleural fluid and atelectasis. An underlying infiltrate is difficult to exclude. There are prominent interstitial lung markings and scattered hazy airspace opacities. There is no pneumothorax. IMPRESSION: 1. Short interval worsening of bibasilar airspace opacities which may represent a combination of pleural fluid and atelectasis or infiltrates. 2. Cardiomegaly. 3. Hazy scattered airspace opacities which can be seen in patients with pulmonary edema or an atypical infectious process. Electronically Signed   By: Constance Holster M.D.   On: 12/23/2018 15:33    EKG:   Orders placed or performed during the hospital encounter of 12/23/18  . ED EKG  . ED EKG   . EKG 12-Lead  . EKG 12-Lead    ASSESSMENT AND PLAN:    #Malignant hypertension Blood pressure today slowly improving Cardene drip titrate as needed, wean off  Intensivist Dr. Mortimer Fries is following  #Acute metabolic encephalopathy from sepsis with E. coli Patient met septic criteria with leukocytosis and tachycardia Blood cultures with E. coli, urine cultures pending.  MRSA  PCR positive Chest x-ray negative On broad-spectrum IV antibiotics aztreonam and vancomycin Monitor lactic acid and procalcitonin COVID test negative flu test negative Hydrate with IV fluids  CT head no acute findings  #Diabetes mellitus-insulin requiring Patient takes Lantus 27 units nightly will start her on 6 units for basal coverage and sliding scale insulin at this time currently n.p.o.  #Elevated troponin-probably demand ischemia from malignant hypertension Monitor patient on telemetry.  Patient denies any chest pain today Cycle cardiac biomarkers-troponin 0 0.33-0.40 Echocardiogram Cardiology  Dr. Nehemiah Massed notified, follow-up with him.  No cardiac interventions needed at this time  #Chronic stable meningioma-CT head findings Continue close monitoring   GI prophylaxis with Protonix DVT prophylaxis with Lovenox subcu    All the records are reviewed and case discussed with Care Management/Social Workerr. Management plans discussed with the patient, daughter [name] and they are in agreement.  CODE STATUS:  fc   TOTAL TIME TAKING CARE OF THIS PATIENT: 36  minutes.   POSSIBLE D/C IN 1-2  DAYS, DEPENDING ON CLINICAL CONDITION.  Note: This dictation was prepared with Dragon dictation along with smaller phrase technology. Any transcriptional errors that result from this process are unintentional.   Nicholes Mango M.D on 12/24/2018 at 1:31 PM  Between 7am to 6pm - Pager - [phone] After  6pm go to [email or web address] - password [email or web address] [facility]  [facility]  Office   [phone]  CC: Primary care physician; [name], MD

## 2018-12-25 DIAGNOSIS — A499 Bacterial infection, unspecified: Secondary | ICD-10-CM

## 2018-12-25 DIAGNOSIS — Z1612 Extended spectrum beta lactamase (ESBL) resistance: Secondary | ICD-10-CM

## 2018-12-25 LAB — GLUCOSE, CAPILLARY
Glucose-Capillary: 157 mg/dL — ABNORMAL HIGH (ref 70–99)
Glucose-Capillary: 187 mg/dL — ABNORMAL HIGH (ref 70–99)
Glucose-Capillary: 164 mg/dL — ABNORMAL HIGH (ref 70–99)
Glucose-Capillary: 190 mg/dL — ABNORMAL HIGH (ref 70–99)
Glucose-Capillary: 219 mg/dL — ABNORMAL HIGH (ref 70–99)
Glucose-Capillary: 209 mg/dL — ABNORMAL HIGH (ref 70–99)

## 2018-12-25 LAB — CBC WITH DIFFERENTIAL/PLATELET
Abs Immature Granulocytes: 0.1 10*3/uL — ABNORMAL HIGH (ref 0.00–0.07)
Basophils Absolute: 0 10*3/uL (ref 0.0–0.1)
Basophils Relative: 0 %
Eosinophils Absolute: 0 10*3/uL (ref 0.0–0.5)
Eosinophils Relative: 0 %
HCT: 42 % (ref 36.0–46.0)
Hemoglobin: 13.5 g/dL (ref 12.0–15.0)
Immature Granulocytes: 1 %
Lymphocytes Relative: 5 %
Lymphs Abs: 0.9 10*3/uL (ref 0.7–4.0)
MCH: 26.6 pg (ref 26.0–34.0)
MCHC: 32.1 g/dL (ref 30.0–36.0)
MCV: 82.7 fL (ref 80.0–100.0)
Monocytes Absolute: 1.3 10*3/uL — ABNORMAL HIGH (ref 0.1–1.0)
Monocytes Relative: 7 %
Neutro Abs: 15.5 10*3/uL — ABNORMAL HIGH (ref 1.7–7.7)
Neutrophils Relative %: 87 %
Platelets: 307 10*3/uL (ref 150–400)
RBC: 5.08 MIL/uL (ref 3.87–5.11)
RDW: 14.9 % (ref 11.5–15.5)
WBC: 17.7 10*3/uL — ABNORMAL HIGH (ref 4.0–10.5)
nRBC: 0 % (ref 0.0–0.2)

## 2018-12-25 LAB — BASIC METABOLIC PANEL
Anion gap: 12 (ref 5–15)
BUN: 11 mg/dL (ref 8–23)
CO2: 26 mmol/L (ref 22–32)
Calcium: 9.3 mg/dL (ref 8.9–10.3)
Chloride: 101 mmol/L (ref 98–111)
Creatinine, Ser: 0.63 mg/dL (ref 0.44–1.00)
GFR calc Af Amer: >60 mL/min (ref >60)
GFR calc non Af Amer: >60 mL/min (ref >60)
Glucose, Bld: 183 mg/dL — ABNORMAL HIGH (ref 70–99)
Potassium: 2.7 mmol/L — CL (ref 3.5–5.1)
Sodium: 139 mmol/L (ref 135–145)

## 2018-12-25 LAB — URINE CULTURE: Culture: >=100000 — AB

## 2018-12-25 LAB — PHOSPHORUS: Phosphorus: 1.5 mg/dL — ABNORMAL LOW (ref 2.5–4.6)

## 2018-12-25 LAB — MAGNESIUM: Magnesium: 1.8 mg/dL (ref 1.7–2.4)

## 2018-12-25 LAB — POTASSIUM: Potassium: 3.3 mmol/L — ABNORMAL LOW (ref 3.5–5.1)

## 2018-12-25 MED ORDER — CLOPIDOGREL BISULFATE 75 MG PO TABS
75.0000 mg | ORAL_TABLET | Freq: Every day | ORAL | Status: DC
  Administered 2018-12-25 – 2018-12-28 (×4): 75 mg via ORAL
  Filled 2018-12-25 (×4): qty 1

## 2018-12-25 MED ORDER — POTASSIUM CHLORIDE 10 MEQ/100ML IV SOLN
10.0000 meq | INTRAVENOUS | Status: AC
  Administered 2018-12-25 (×5): 10 meq via INTRAVENOUS
  Filled 2018-12-25 (×5): qty 100

## 2018-12-25 MED ORDER — MAGNESIUM SULFATE 2 GM/50ML IV SOLN
2.0000 g | Freq: Once | INTRAVENOUS | Status: AC
  Administered 2018-12-25: 17:00:00 2 g via INTRAVENOUS
  Filled 2018-12-25: qty 50

## 2018-12-25 MED ORDER — INSULIN GLARGINE 100 UNIT/ML ~~LOC~~ SOLN
10.0000 [IU] | Freq: Every day | SUBCUTANEOUS | Status: DC
  Administered 2018-12-25 – 2018-12-27 (×3): 10 [IU] via SUBCUTANEOUS
  Filled 2018-12-25 (×4): qty 0.1

## 2018-12-25 MED ORDER — POTASSIUM PHOSPHATES 15 MMOLE/5ML IV SOLN
30.0000 mmol | Freq: Once | INTRAVENOUS | Status: AC
  Administered 2018-12-25: 13:00:00 30 mmol via INTRAVENOUS
  Filled 2018-12-25: qty 10

## 2018-12-25 NOTE — Progress Notes (Signed)
Pharmacy Electrolyte Monitoring Consult:  Pharmacy consulted to assist in monitoring and replacing electrolytes in this 78 y.o. female admitted on 12/23/2018 with Altered Mental Status   Labs:  Sodium (mmol/L)  Date Value  12/25/2018 139   Potassium (mmol/L)  Date Value  12/25/2018 3.3 (L)   Magnesium (mg/dL)  Date Value  12/25/2018 1.8   Phosphorus (mg/dL)  Date Value  12/25/2018 1.5 (L)   Calcium (mg/dL)  Date Value  12/25/2018 9.3   Albumin (g/dL)  Date Value  12/23/2018 2.9 (L)    Assessment/Plan: Patient receiving torsemide 20mg  PO BID.   Patient received potassium 92mEq IV Q5hr x 5 doses this am.   Will order potassium 74mmol IV x 1 and magnesium 2g IV x 1.   Most recent potassium level obtained during potassium phosphate infusion. Will not give further replacement.   Will replace for goal potassium ~ 4, goal magnesium ~ 2, and goal phosphorus > 2.5.   Pharmacy will continue to monitor and adjust per consult.   Jenice Leiner L 12/25/2018 4:16 PM

## 2018-12-25 NOTE — Progress Notes (Signed)
Daily Progress Note   Patient Name: [name]       Date: 12/25/2018 DOB: September 15, 1940  Age: 78 y.o. MRN#: [id number] Attending Physician: Fritzi Mandes, MD Primary Care Physician: [name], MD Admit Date: 12/23/2018  Reason for Consultation/Follow-up: Establishing goals of care  Subjective: Patient is resting in bed. She states she does not like that the mattress moves. Explained the purpose. She states she does not like the lines/tubes/drains.    We discussed her diagnosis, prognosis, GOC, EOL wishes disposition and options.   The difference between an aggressive medical intervention path and a comfort care path was discussed.  Values and goals of care important to patient and family were attempted to be elicited.  Discussed limitations of medical interventions to prolong quality of life in some situations and discussed the concept of human mortality.  She states "I have never given it any thought" when asking about GOC. She states "it's hard to say" when asked about code status.   Length of Stay: 2  Current Medications: Scheduled Meds:  . amLODipine  10 mg Oral Daily  . carvedilol  6.25 mg Oral BID WC  . Chlorhexidine Gluconate Cloth  6 each Topical Q0600  . clopidogrel  75 mg Oral Daily  . enoxaparin (LOVENOX) injection  40 mg Subcutaneous Q24H  . insulin aspart  0-9 Units Subcutaneous Q4H  . insulin glargine  6 Units Subcutaneous QHS  . ipratropium-albuterol  3 mL Nebulization BID  . mupirocin ointment  1 application Nasal BID  . pantoprazole  40 mg Oral QHS  . sodium chloride flush  10-40 mL Intracatheter Q12H  . torsemide  20 mg Oral BID    Continuous Infusions: . meropenem (MERREM) IV 1 g (12/25/18 0517)  . potassium PHOSPHATE IVPB (in mmol)      PRN Meds:  acetaminophen, hydrALAZINE, labetalol, LORazepam, nitroGLYCERIN, ondansetron (ZOFRAN) IV, phenol, promethazine, sodium chloride flush  Physical Exam Pulmonary:     Effort: Pulmonary effort is normal.  Neurological:     Mental Status: She is alert.             Vital Signs: BP (!) 176/88   Pulse 87   Temp 98.2 F (36.8 C) (Oral)   Resp 16   Ht 5\' 6"  (1.676 m)   Wt 77.6 kg  SpO2 96%   BMI 27.61 kg/m  SpO2: SpO2: 96 % O2 Device: O2 Device: Room Air O2 Flow Rate: O2 Flow Rate (L/min): 2 L/min  Intake/output summary:   Intake/Output Summary (Last 24 hours) at 12/25/2018 1305 Last data filed at 12/25/2018 0600 Gross per 24 hour  Intake 785.62 ml  Output 1100 ml  Net -314.38 ml   LBM: Last BM Date: (PTA) Baseline Weight: Weight: 90.7 kg Most recent weight: Weight: 77.6 kg       Palliative Assessment/Data:    Flowsheet Rows     Most Recent Value  Intake Tab  Unit at Time of Referral  ICU  Date Notified  12/23/18  Palliative Care Type  Return patient Palliative Care  Reason for referral  Clarify Goals of Care  Date of Admission  12/23/18  Date first seen by Palliative Care  12/24/18  # of days Palliative referral response time  1 Day(s)  # of days IP prior to Palliative referral  0  Clinical Assessment  Psychosocial & Spiritual Assessment  Palliative Care Outcomes      Patient Active Problem List   Diagnosis Date Noted  . ESBL (extended spectrum beta-lactamase) producing bacteria infection 12/25/2018  . E coli bacteremia 12/25/2018  . HTN (hypertension), malignant 12/23/2018  . Healthcare-associated pneumonia 06/03/2018  . Acute cystitis 04/10/2018  . Sepsis due to Streptococcus, group B ([place])   . Multiple open wounds of lower leg   . PVD (peripheral vascular disease) ([place])   . Bacteremia 08/31/2016  . Sepsis ([place]) 08/29/2016  . Benign essential HTN   . Acute on chronic diastolic heart failure ([place])   . Ulcer of right lower extremity ([place])   . Chest  pain 07/05/2016  . Impacted cerumen of both ears 06/05/2016  . 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 Pleural effusion, bilateral   . Wheeze   . Dyspnea   . CHF (congestive heart failure) ([place])   . Right sided weakness   . Acute respiratory acidosis   . Surgery, elective   . Anemia, iron deficiency   . Leukocytosis   . Cerebrovascular accident (CVA) due to thrombosis of precerebral artery ([place]) 03/01/2016  . 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 PAD (peripheral artery disease) ([place]) 02/28/2016  . Atherosclerotic peripheral vascular disease with intermittent claudication ([place]) 12/08/2015  . Open toe wound 05/04/2014  . Anemia, chronic disease 04/27/2014  . Occult blood positive stool 04/17/2014  . Edema 04/03/2014  . Unspecified hereditary and idiopathic peripheral neuropathy 03/14/2014  . DM2 (diabetes mellitus, type 2) ([place]) 02/14/2014  . Altered mental status 02/12/2014  . Acute encephalopathy 02/12/2014  . Aortic stenosis 02/04/2014  . Closed left subtrochanteric femur fracture ([place]) 02/03/2014  . Hip fracture, left ([place]) 02/03/2014  . Hypokalemia 02/03/2014  . Fibula fracture 02/03/2014  . MTP instability 02/03/2014  . Hip fracture ([place]) 02/03/2014  . HTN (hypertension)   . HLD (hyperlipidemia)     Palliative Care Assessment & Plan    Recommendations/Plan:  Recommend palliative at D/C.     Code Status: Code Status History    Date Active Date Inactive Code Status Order ID Comments User Context   06/03/2018 1511 06/10/2018 2216 Full Code [id number]  Vaughan Basta, MD ED   04/10/2018 1437 04/17/2018 1946 Full Code [id number]  Loletha Grayer, MD ED   08/29/2016 0516 09/17/2016 1938 Full Code [id number]  Etta Quill, DO ED   07/05/2016 1755 07/09/2016 2131 Full Code  [id number]  Shela Leff, MD Inpatient   03/01/2016 1524 03/13/2016 2119 Full Code [id number]  [name], MD Inpatient    03/01/2016 1450 03/01/2016 1524 Full Code [id number]  Estill Bakes Inpatient   03/01/2016 1104 03/01/2016 1450 Full Code [id number]  Roland Earl, MD Inpatient   02/28/2016 1915 03/01/2016 1104 Full Code [id number]  Orbie Hurst Inpatient   02/12/2014 0708 02/13/2014 1850 Full Code [id number]  Oswald Hillock, MD Inpatient   02/08/2014 1317 02/10/2014 1758 Full Code [id number]  Wylene Simmer, MD Inpatient   02/03/2014 1459 02/08/2014 1317 Full Code [id number]  Radene Gunning, NP Inpatient       Prognosis:   Unable to determine  Discharge Planning:  To Be Determined  Care plan was discussed with RN, primary team  Thank you for allowing the Palliative Medicine Team to assist in the care of this patient.   Total Time 35 min Prolonged Time Billed no      Greater than 50%  of this time was spent counseling and coordinating care related to the above assessment and plan.  Asencion Gowda, NP  Please contact Palliative Medicine Team phone at [phone] for questions and concerns.

## 2018-12-25 NOTE — Progress Notes (Signed)
SYNOPSIS  CC  Follow up HTN    HPI HTN better  E.coli bacteremia ESBL urine On IV abx Alert and awake No resp distress       VITALS: BP (!) 156/66   Pulse 96   Temp 98.2 F (36.8 C) (Oral)   Resp (!) 22   Ht 5\' 6"  (1.676 m)   Wt 77.6 kg   SpO2 95%   BMI 27.61 kg/m     I/O last 3 completed shifts: In: 4765 [P.O.:720; I.V.:403.4; IV Piggyback:417.6] Out: 1800 [Urine:1800] No intake/output data recorded.  SpO2: 95 % O2 Flow Rate (L/min): 2 L/min   Review of Systems:  Gen:  Denies  fever, sweats, chills weigh loss  HEENT: Denies blurred vision, double vision, ear pain, eye pain, hearing loss, nose bleeds, sore throat Cardiac:  No dizziness, chest pain or heaviness, chest tightness,edema, No JVD Resp:   No cough, -sputum production, -shortness of breath,-wheezing, -hemoptysis,  Gi: Denies swallowing difficulty, stomach pain, nausea or vomiting, diarrhea, constipation, bowel incontinence Gu:  Denies bladder incontinence, burning urine Ext:   Denies Joint pain, stiffness or swelling Skin: Denies  skin rash, easy bruising or bleeding or hives Endoc:  Denies polyuria, polydipsia , polyphagia or weight change Psych:   Denies depression, insomnia or hallucinations  Other:  All other systems negative  Physical Examination:   GENERAL:NAD, no fevers, chills, no weakness no fatigue HEAD: Normocephalic, atraumatic.  EYES: PERLA, EOMI No scleral icterus.  MOUTH: Moist mucosal membrane.  EAR, NOSE, THROAT: Clear without exudates. No external lesions.  NECK: Supple. No thyromegaly.  No JVD.  PULMONARY: CTA B/L no wheezing, rhonchi, crackles CARDIOVASCULAR: S1 and S2. Regular rate and rhythm. No murmurs GASTROINTESTINAL: Soft, nontender, nondistended. Positive bowel sounds.  MUSCULOSKELETAL: No swelling, clubbing, or edema.  NEUROLOGIC: No gross focal neurological deficits 0/5 strength lower extremities SKIN: No ulceration, lesions, rashes, or cyanosis.  PSYCHIATRIC:  Insight, judgment intact. -depression -anxiety ALL OTHER ROS ARE NEGATIVE    I personally reviewed Labs under Results section.   MEDICATIONS: I have reviewed all medications and confirmed regimen as documented   CULTURE RESULTS   Recent Results (from the past 240 hour(s))  SARS Coronavirus 2 (CEPHEID - Performed in [place] hospital lab), Hosp Order     Status: None   Collection Time: 12/23/18  9:55 AM  Result Value Ref Range Status   SARS Coronavirus 2 NEGATIVE NEGATIVE Final    Comment: (NOTE) If result is NEGATIVE SARS-CoV-2 target nucleic acids are NOT DETECTED. The SARS-CoV-2 RNA is generally detectable in upper and lower  respiratory specimens during the acute phase of infection. The lowest  concentration of SARS-CoV-2 viral copies this assay can detect is 250  copies / mL. A negative result does not preclude SARS-CoV-2 infection  and should not be used as the sole basis for treatment or other  patient management decisions.  A negative result may occur with  improper specimen collection / handling, submission of specimen other  than nasopharyngeal swab, presence of viral mutation(s) within the  areas targeted by this assay, and inadequate number of viral copies  (<250 copies / mL). A negative result must be combined with clinical  observations, patient history, and epidemiological information. If result is POSITIVE SARS-CoV-2 target nucleic acids are DETECTED. The SARS-CoV-2 RNA is generally detectable in upper and lower  respiratory specimens dur ing the acute phase of infection.  Positive  results are indicative of active infection with SARS-CoV-2.  Clinical  correlation with patient  history and other diagnostic information is  necessary to determine patient infection status.  Positive results do  not rule out bacterial infection or co-infection with other viruses. If result is PRESUMPTIVE POSTIVE SARS-CoV-2 nucleic acids MAY BE PRESENT.   A presumptive positive  result was obtained on the submitted specimen  and confirmed on repeat testing.  While 2019 novel coronavirus  (SARS-CoV-2) nucleic acids may be present in the submitted sample  additional confirmatory testing may be necessary for epidemiological  and / or clinical management purposes  to differentiate between  SARS-CoV-2 and other Sarbecovirus currently known to infect humans.  If clinically indicated additional testing with an alternate test  methodology [phone]) is advised. The SARS-CoV-2 RNA is generally  detectable in upper and lower respiratory sp ecimens during the acute  phase of infection. The expected result is Negative. Fact Sheet for Patients:  StrictlyIdeas.no Fact Sheet for Healthcare Providers: BankingDealers.co.za This test is not yet approved or cleared by the [place] FDA and has been authorized for detection and/or diagnosis of SARS-CoV-2 by FDA under an Emergency Use Authorization (EUA).  This EUA will remain in effect (meaning this test can be used) for the duration of the COVID-19 declaration under Section 564(b)(1) of the Act, 21 U.S.C. section 360bbb-3(b)(1), unless the authorization is terminated or revoked sooner. Performed at [facility], [address], [place], [place] [address]   Urine Culture     Status: Abnormal   Collection Time: 12/23/18 11:13 AM  Result Value Ref Range Status   Specimen Description   Final    URINE, CLEAN CATCH Performed at [facility], [address], [place], [place] [address]    Special Requests   Final    NONE Performed at [facility], [place] [address], [place], [place] [address]    Culture (A)  Final    >=100,000 COLONIES/mL ESCHERICHIA COLI Confirmed Extended Spectrum Beta-Lactamase Producer (ESBL).  In bloodstream infections from ESBL organisms, carbapenems are preferred over piperacillin/tazobactam. They are shown to have a lower risk of  mortality.    Report Status 12/25/2018 FINAL  Final   Organism ID, Bacteria ESCHERICHIA COLI (A)  Final      Susceptibility   Escherichia coli - MIC*    AMPICILLIN >=32 RESISTANT Resistant     CEFAZOLIN >=64 RESISTANT Resistant     CEFTRIAXONE >=64 RESISTANT Resistant     CIPROFLOXACIN >=4 RESISTANT Resistant     GENTAMICIN <=1 SENSITIVE Sensitive     IMIPENEM <=0.25 SENSITIVE Sensitive     NITROFURANTOIN <=16 SENSITIVE Sensitive     TRIMETH/SULFA >=320 RESISTANT Resistant     AMPICILLIN/SULBACTAM >=32 RESISTANT Resistant     PIP/TAZO 8 SENSITIVE Sensitive     Extended ESBL POSITIVE Resistant     * >=100,000 COLONIES/mL ESCHERICHIA COLI  MRSA PCR Screening     Status: Abnormal   Collection Time: 12/23/18 12:47 PM  Result Value Ref Range Status   MRSA by PCR POSITIVE (A) NEGATIVE Final    Comment:        The GeneXpert MRSA Assay (FDA approved for NASAL specimens only), is one component of a comprehensive MRSA colonization surveillance program. It is not intended to diagnose MRSA infection nor to guide or monitor treatment for MRSA infections. RESULT CALLED TO, READ BACK BY AND VERIFIED WITH: BRANDI MCGHEE AT 3267 ON 12/23/2018 [place]. Performed at [facility], [address] [place], [place] [address]   Blood culture (routine x 2)  Status: Abnormal (Preliminary result)   Collection Time: 12/23/18  1:00 PM  Result Value Ref Range Status   Specimen Description   Final    BLOOD BLOOD RIGHT HAND Performed at [facility], [address] [place], [place] [address]    Special Requests   Final    BOTTLES DRAWN AEROBIC AND ANAEROBIC Blood Culture adequate volume Performed at [facility], [address] [place], [place] [address]    Culture  Setup Time   Final    GRAM NEGATIVE RODS AEROBIC BOTTLE ONLY CRITICAL RESULT CALLED TO, READ BACK BY AND VERIFIED WITH: Tobie Lords [id number] 12/24/2018 HNM Performed at [facility], [place] [address] [place], [place] [address]    Culture ESCHERICHIA COLI (A)  Final   Report Status PENDING  Incomplete  Blood Culture ID Panel (Reflexed)     Status: Abnormal   Collection Time: 12/23/18  1:00 PM  Result Value Ref Range Status   Enterococcus species NOT DETECTED NOT DETECTED Final   Listeria monocytogenes NOT DETECTED NOT DETECTED Final   Staphylococcus species NOT DETECTED NOT DETECTED Final   Staphylococcus aureus (BCID) NOT DETECTED NOT DETECTED Final   Streptococcus species NOT DETECTED NOT DETECTED Final   Streptococcus agalactiae NOT DETECTED NOT DETECTED Final   Streptococcus pneumoniae NOT DETECTED NOT DETECTED Final   Streptococcus pyogenes NOT DETECTED NOT DETECTED Final   Acinetobacter baumannii NOT DETECTED NOT DETECTED Final   Enterobacteriaceae species DETECTED (A) NOT DETECTED Final    Comment: Enterobacteriaceae represent a large family of gram-negative bacteria, not a single organism. CRITICAL RESULT CALLED TO, READ BACK BY AND VERIFIED WITH: DAVID BESANTI PHARMD 0250 12/24/2018 HNM    Enterobacter cloacae complex NOT DETECTED NOT DETECTED Final   Escherichia coli DETECTED (A) NOT DETECTED Final    Comment: CRITICAL RESULT CALLED TO, READ BACK BY AND VERIFIED WITH: DAVID BESANTI PHARMD 0250 12/24/2018 HNM     Klebsiella oxytoca NOT DETECTED NOT DETECTED Final   Klebsiella pneumoniae NOT DETECTED NOT DETECTED Final   Proteus species NOT DETECTED NOT DETECTED Final   Serratia marcescens NOT DETECTED NOT DETECTED Final   Carbapenem resistance NOT DETECTED NOT DETECTED Final   Haemophilus influenzae NOT DETECTED NOT DETECTED Final   Neisseria meningitidis NOT DETECTED NOT DETECTED Final   Pseudomonas aeruginosa NOT DETECTED NOT DETECTED Final   Candida albicans NOT DETECTED NOT DETECTED Final   Candida glabrata NOT DETECTED NOT DETECTED Final   Candida krusei NOT DETECTED NOT DETECTED Final   Candida parapsilosis NOT DETECTED NOT DETECTED Final   Candida  tropicalis NOT DETECTED NOT DETECTED Final    Comment: Performed at [facility], [address] [place], [place] [address]  Blood culture (routine x 2)     Status: None (Preliminary result)   Collection Time: 12/23/18  1:22 PM  Result Value Ref Range Status   Specimen Description BLOOD BLOOD LEFT HAND  Final   Special Requests   Final    BOTTLES DRAWN AEROBIC AND ANAEROBIC Blood Culture adequate volume   Culture  Setup Time   Final    GRAM NEGATIVE RODS AEROBIC BOTTLE ONLY CRITICAL RESULT CALLED TO, READ BACK BY AND VERIFIED WITH: Tobie Lords [id number] 12/24/2018 HNM Performed at [facility], [address] [place], [place] [address]    Culture GRAM NEGATIVE RODS  Final   Report Status PENDING  Incomplete           ASSESSMENT AND PLAN SYNOPSIS  HTN emergency -resolved Sepsis with E coli bacteremia and ESBL UTI treated with appropriate ABX Patient is floor care status   ELECTROLYTES -follow labs as needed -replace as needed -pharmacy consultation and following    DVT/GI PRX ordered TRANSFUSIONS AS NEEDED MONITOR FSBS ASSESS the need for LABS as needed    Corrin Parker, M.D.  [facility]  Medical Director [place] Director [facility] Cardio-Pulmonary Department

## 2018-12-25 NOTE — Progress Notes (Signed)
[place] at [place] NAME: [name]    MR#:  [id number]  DATE OF BIRTH:  Dec 21, 1940  SUBJECTIVE:  CHIEF COMPLAINT:  Pt is feeling better.  Answering most of the questions Wants more water. Wants to go home REVIEW OF SYSTEMS:  CONSTITUTIONAL: No fever, fatigue or weakness.  EYES: No blurred or double vision.  EARS, NOSE, AND THROAT: No tinnitus or ear pain.  RESPIRATORY: No cough, shortness of breath, wheezing or hemoptysis.  CARDIOVASCULAR: No chest pain, orthopnea, edema.  GASTROINTESTINAL: No nausea, vomiting, diarrhea or abdominal pain.  HEMATOLOGY: No anemia, easy bruising or bleeding SKIN: No rash or lesion. MUSCULOSKELETAL: No joint pain or arthritis.   NEUROLOGIC: No tingling, numbness, weakness.  PSYCHIATRY: No anxiety or depression.   DRUG ALLERGIES:   Allergies  Allergen Reactions  . Iodine Anaphylaxis  . Penicillins Anaphylaxis    Tolerates ceftriaxone, cefazolin Has patient had a PCN reaction causing immediate rash, facial/tongue/throat swelling, SOB or lightheadedness with hypotension: Unknown Has patient had a PCN reaction causing severe rash involving mucus membranes or skin necrosis: Unknown Has patient had a PCN reaction that required hospitalization: Unknown Has patient had a PCN reaction occurring within the last 10 years: Unknown If all of the above answers are "NO", then may proceed with Cephalosporin u  . Shellfish Allergy Anaphylaxis  . Shellfish-Derived Products Anaphylaxis  . Sulfa Antibiotics Anaphylaxis  . Sulfacetamide Sodium Anaphylaxis  . Sulfasalazine Anaphylaxis  . Eggs Or Egg-Derived Products     Other reaction(s): SHORTNESS OF BREATH  . Morphine And Related Other (See Comments)    Altered mental status    VITALS:  Blood pressure (!) 176/88, pulse 87, temperature 98.2 F (36.8 C), temperature source Oral, resp. rate 16, height '5\' 6"'  (1.676 m), weight 77.6 kg, SpO2 96 %.  PHYSICAL  EXAMINATION:  GENERAL:  78 y.o.-year-old patient lying in the bed with no acute distress. Chronically ill EYES: Pupils equal, round, reactive to light and accommodation. No scleral icterus. Extraocular muscles intact.  HEENT: Head atraumatic, normocephalic. Oropharynx and nasopharynx clear.  NECK:  Supple, no jugular venous distention. No thyroid enlargement, no tenderness.  LUNGS: Normal breath sounds bilaterally, no wheezing, rales,rhonchi or crepitation. No use of accessory muscles of respiration.  CARDIOVASCULAR: S1, S2 normal. No murmurs, rubs, or gallops.  ABDOMEN: Soft, nontender, nondistended. Bowel sounds present. No organomegaly or mass.  EXTREMITIES: No pedal edema, cyanosis, or clubbing.  NEUROLOGIC: Awake alert, oriented x2 sensation intact. Gait not checked. Moves all extremities well. Overall deconditioned/week PSYCHIATRIC: The patient is alert and oriented x2- 3.  SKIN: No obvious rash, lesion, or ulcer.    LABORATORY PANEL:   CBC Recent Labs  Lab 12/25/18 0425  WBC 17.7*  HGB 13.5  HCT 42.0  PLT 307   ------------------------------------------------------------------------------------------------------------------  Chemistries  Recent Labs  Lab 12/23/18 1540  12/25/18 0425 12/25/18 1332  NA 139   < > 139  --   K 3.8   < > 2.7* 3.3*  CL 101   < > 101  --   CO2 27   < > 26  --   GLUCOSE 290*   < > 183*  --   BUN 12   < > 11  --   CREATININE 0.57   < > 0.63  --   CALCIUM 9.4   < > 9.3  --   MG  --    < > 1.8  --   AST 11*  --   --   --  ALT 9  --   --   --   ALKPHOS 69  --   --   --   BILITOT 0.6  --   --   --    < > = values in this interval not displayed.   ------------------------------------------------------------------------------------------------------------------  Cardiac Enzymes Recent Labs  Lab 12/24/18 0044  TROPONINI 0.40*    ------------------------------------------------------------------------------------------------------------------  RADIOLOGY:  Dg Chest Port 1 View  Result Date: 12/23/2018 CLINICAL DATA:  Sepsis EXAM: PORTABLE CHEST 1 VIEW COMPARISON:  12/23/2018 FINDINGS: The cardiac silhouette is enlarged. There are short interval worsening of bibasilar airspace opacities favored to represent a combination of pleural fluid and atelectasis. An underlying infiltrate is difficult to exclude. There are prominent interstitial lung markings and scattered hazy airspace opacities. There is no pneumothorax. IMPRESSION: 1. Short interval worsening of bibasilar airspace opacities which may represent a combination of pleural fluid and atelectasis or infiltrates. 2. Cardiomegaly. 3. Hazy scattered airspace opacities which can be seen in patients with pulmonary edema or an atypical infectious process. Electronically Signed   By: Constance Holster M.D.   On: 12/23/2018 15:33    EKG:   Orders placed or performed during the hospital encounter of 12/23/18  . ED EKG  . ED EKG  . EKG 12-Lead  . EKG 12-Lead    ASSESSMENT AND PLAN:   [name]  is a 78 y.o. female with a known history of insulin requiring diabetes mellitus, GERD, hyperlipidemia, hypertension, old history of stroke is brought into the ED for altered mental status via EMS  #Malignant hypertension Blood pressure today slowly improving Cardene drip titrate as needed, wean off  Intensivist Dr. Mortimer Fries is following  #Acute metabolic encephalopathy from sepsis with E. coli Patient met septic criteria with leukocytosis and tachycardia Blood cultures with E. coli, urine cultures pending.  MRSA  PCR positive Chest x-ray negative On broad-spectrum IV antibiotics meropenem COVID test negative flu test negative Hydrate with IV fluids  CT head no acute findings  #Diabetes mellitus-insulin requiring Patient takes Lantus 6 units nightly and sliding scale  insulin   #Elevated troponin-probably demand ischemia from malignant hypertension  - Patient denies any chest pain today -Cycle cardiac biomarkers-troponin 0 0.33-0.40 -Echocardiogram left ventricle has low normal systolic function, with an ejection fraction of 50-55%. The cavity size was normal. There is moderately increased left ventricular wall thickness. Left ventricular diastolic Doppler parameters are indeterminate. -Cardiology  Dr. Nehemiah Massed notified, follow-up with him.  No cardiac interventions needed at this time  #Chronic stable meningioma-CT head findings Continue close monitoring  #GI prophylaxis with Protonix  # DVT prophylaxis with Lovenox subcu  PT to see pt  All the records are reviewed and case discussed with Care Management/Social Workerr. Management plans discussed with the patient, daughter [name] and they are in agreement.  CODE STATUS:  fc   TOTAL TIME TAKING CARE OF THIS PATIENT: 36  minutes.   POSSIBLE D/C IN 1-2  DAYS, DEPENDING ON CLINICAL CONDITION.  Note: This dictation was prepared with Dragon dictation along with smaller phrase technology. Any transcriptional errors that result from this process are unintentional.   Fritzi Mandes M.D on 12/25/2018 at 2:20 PM  Between 7am to 6pm - Pager - [phone] After 6pm go to [email or web address] - password [email or web address]  [place] [facility]  Office  [phone]  CC: Primary care physician; [name], MD

## 2018-12-25 NOTE — Progress Notes (Signed)
Patient's Daughter called about concerns of patient's husband thinking patient is going "to die". Daughter was reassured that the patient was not actively dying but what the palliative nurse practitioner was contacting them about goals of care. Daughter was also told that the patient does not wish to be in the hospital and repeatedly stated she dislikes the labs, "all of the cords" and IV's. Daughter stated she wanted everything done and "this is not a good time to die."

## 2018-12-26 LAB — CBC WITH DIFFERENTIAL/PLATELET
Abs Immature Granulocytes: 0.05 10*3/uL (ref 0.00–0.07)
Basophils Absolute: 0 10*3/uL (ref 0.0–0.1)
Basophils Relative: 0 %
Eosinophils Absolute: 0.1 10*3/uL (ref 0.0–0.5)
Eosinophils Relative: 0 %
HCT: 40.5 % (ref 36.0–46.0)
Hemoglobin: 13.3 g/dL (ref 12.0–15.0)
Immature Granulocytes: 0 %
Lymphocytes Relative: 12 %
Lymphs Abs: 1.4 10*3/uL (ref 0.7–4.0)
MCH: 26.4 pg (ref 26.0–34.0)
MCHC: 32.8 g/dL (ref 30.0–36.0)
MCV: 80.4 fL (ref 80.0–100.0)
Monocytes Absolute: 0.8 10*3/uL (ref 0.1–1.0)
Monocytes Relative: 6 %
Neutro Abs: 9.5 10*3/uL — ABNORMAL HIGH (ref 1.7–7.7)
Neutrophils Relative %: 82 %
Platelets: 337 10*3/uL (ref 150–400)
RBC: 5.04 MIL/uL (ref 3.87–5.11)
RDW: 14.9 % (ref 11.5–15.5)
WBC: 11.8 10*3/uL — ABNORMAL HIGH (ref 4.0–10.5)
nRBC: 0 % (ref 0.0–0.2)

## 2018-12-26 LAB — CULTURE, BLOOD (ROUTINE X 2)
Special Requests: ADEQUATE
Special Requests: ADEQUATE

## 2018-12-26 LAB — BASIC METABOLIC PANEL
Anion gap: 10 (ref 5–15)
BUN: 12 mg/dL (ref 8–23)
CO2: 29 mmol/L (ref 22–32)
Calcium: 8.7 mg/dL — ABNORMAL LOW (ref 8.9–10.3)
Chloride: 96 mmol/L — ABNORMAL LOW (ref 98–111)
Creatinine, Ser: 0.5 mg/dL (ref 0.44–1.00)
GFR calc Af Amer: >60 mL/min (ref >60)
GFR calc non Af Amer: >60 mL/min (ref >60)
Glucose, Bld: 152 mg/dL — ABNORMAL HIGH (ref 70–99)
Potassium: 3.1 mmol/L — ABNORMAL LOW (ref 3.5–5.1)
Sodium: 135 mmol/L (ref 135–145)

## 2018-12-26 LAB — GLUCOSE, CAPILLARY
Glucose-Capillary: 151 mg/dL — ABNORMAL HIGH (ref 70–99)
Glucose-Capillary: 155 mg/dL — ABNORMAL HIGH (ref 70–99)
Glucose-Capillary: 208 mg/dL — ABNORMAL HIGH (ref 70–99)
Glucose-Capillary: 209 mg/dL — ABNORMAL HIGH (ref 70–99)
Glucose-Capillary: 229 mg/dL — ABNORMAL HIGH (ref 70–99)
Glucose-Capillary: 251 mg/dL — ABNORMAL HIGH (ref 70–99)
Glucose-Capillary: 254 mg/dL — ABNORMAL HIGH (ref 70–99)

## 2018-12-26 LAB — MAGNESIUM: Magnesium: 2 mg/dL (ref 1.7–2.4)

## 2018-12-26 LAB — PHOSPHORUS: Phosphorus: 2.3 mg/dL — ABNORMAL LOW (ref 2.5–4.6)

## 2018-12-26 MED ORDER — FUROSEMIDE 10 MG/ML IJ SOLN
20.0000 mg | Freq: Two times a day (BID) | INTRAMUSCULAR | Status: DC
  Administered 2018-12-26 – 2018-12-27 (×3): 20 mg via INTRAVENOUS
  Filled 2018-12-26 (×3): qty 2

## 2018-12-26 MED ORDER — POTASSIUM PHOSPHATE MONOBASIC 500 MG PO TABS
1000.0000 mg | ORAL_TABLET | ORAL | Status: DC
  Administered 2018-12-26: 07:00:00 1000 mg via ORAL
  Filled 2018-12-26 (×2): qty 2

## 2018-12-26 MED ORDER — DOCUSATE SODIUM 100 MG PO CAPS
100.0000 mg | ORAL_CAPSULE | Freq: Two times a day (BID) | ORAL | Status: DC
  Administered 2018-12-26: 12:00:00 100 mg via ORAL
  Filled 2018-12-26: qty 1

## 2018-12-26 MED ORDER — POTASSIUM PHOSPHATE MONOBASIC 500 MG PO TABS
1000.0000 mg | ORAL_TABLET | Freq: Once | ORAL | Status: AC
  Administered 2018-12-26: 09:00:00 1000 mg via ORAL
  Filled 2018-12-26: qty 2

## 2018-12-26 MED ORDER — POTASSIUM CHLORIDE CRYS ER 20 MEQ PO TBCR
40.0000 meq | EXTENDED_RELEASE_TABLET | ORAL | Status: DC
  Administered 2018-12-26: 07:00:00 40 meq via ORAL
  Filled 2018-12-26: qty 2

## 2018-12-26 MED ORDER — CARVEDILOL 12.5 MG PO TABS
12.5000 mg | ORAL_TABLET | Freq: Two times a day (BID) | ORAL | Status: DC
  Administered 2018-12-26 – 2018-12-28 (×5): 12.5 mg via ORAL
  Filled 2018-12-26 (×5): qty 1

## 2018-12-26 MED ORDER — POTASSIUM CHLORIDE CRYS ER 20 MEQ PO TBCR
40.0000 meq | EXTENDED_RELEASE_TABLET | Freq: Two times a day (BID) | ORAL | Status: AC
  Administered 2018-12-26 (×2): 40 meq via ORAL
  Filled 2018-12-26 (×2): qty 2

## 2018-12-26 MED ORDER — HYDRALAZINE HCL 50 MG PO TABS
25.0000 mg | ORAL_TABLET | Freq: Three times a day (TID) | ORAL | Status: DC
  Administered 2018-12-26 – 2018-12-28 (×8): 25 mg via ORAL
  Filled 2018-12-26 (×8): qty 1

## 2018-12-26 NOTE — Progress Notes (Signed)
Pharmacy Electrolyte Monitoring Consult:  Pharmacy consulted to assist in monitoring and replacing electrolytes in this 78 y.o. female admitted on 12/23/2018 with Altered Mental Status   Labs:  Sodium (mmol/L)  Date Value  12/26/2018 135   Potassium (mmol/L)  Date Value  12/26/2018 3.1 (L)   Magnesium (mg/dL)  Date Value  12/26/2018 2.0   Phosphorus (mg/dL)  Date Value  12/26/2018 2.3 (L)   Calcium (mg/dL)  Date Value  12/26/2018 8.7 (L)   Albumin (g/dL)  Date Value  12/23/2018 2.9 (L)    Assessment/Plan: 05/23 @ 0500 K 3.1, phos 2.3. Will replace w/ Kphos 1000 mg PO x 2 and KCI 40 mEq PO x 2 and KCI 40 mEq PO bid x 2 doses and will recheck electrolytes w/ am labs.  Will replace for goal potassium ~ 4, goal magnesium ~ 2, and goal phosphorus > 2.5.   Pharmacy will continue to monitor and adjust per consult.   Tobie Lords, PharmD, BCPS Clinical Pharmacist 12/26/2018

## 2018-12-26 NOTE — Progress Notes (Signed)
Pharmacy Electrolyte Monitoring Consult:  Pharmacy consulted to assist in monitoring and replacing electrolytes in this 78 y.o. female admitted on 12/23/2018 with Altered Mental Status   Labs:  Sodium (mmol/White)  Date Value  12/26/2018 135   Potassium (mmol/White)  Date Value  12/26/2018 3.1 (White)   Magnesium (mg/dL)  Date Value  12/26/2018 2.0   Phosphorus (mg/dL)  Date Value  12/26/2018 2.3 (White)   Calcium (mg/dL)  Date Value  12/26/2018 8.7 (White)   Albumin (g/dL)  Date Value  12/23/2018 2.9 (White)    Assessment/Plan: Patient receiving torsemide 20mg  PO BID and furosemide 20mg  IV Q12hr. Per conversation with MD, will discontinue torsemide.    Patient ordered potassium 2mEq PO x 1 and then potassium 33mEq PO BID x 2 doses. Patient ordered potassium phosphate 1000mg  PO x 2 doses.   Will replace for goal potassium ~ 4, goal magnesium ~ 2, and goal phosphorus > 2.5.   Pharmacy will continue to monitor and adjust per consult.   [name] 12/26/2018 10:30 AM

## 2018-12-26 NOTE — Evaluation (Signed)
Physical Therapy Evaluation Patient Details Name: [name] MRN: [id number] DOB: August 01, 1941 Today's Date: 12/26/2018   History of Present Illness  Pt is a 78 y.o. female presenting to hospital 12/23/18 with AMS and vomiting.  CT of head showing stable R posterior cranial fossa meningioma.  Pt admitted with malignant htn, acute metabolic encephalopathy from sepsis, DM, elevated troponin (probably from demand ischemia per chart review), and UTI.  PMH includes heart failure, DM, HLD, htn, CVA, CHF, R great toe wound, SOB, PVD, h/o L IMN.  Clinical Impression  Prior to hospital admission, per chart review pt was able to transfer and take a few steps.  Pt lives with her husband; son and daughter assist a lot.  Pt requesting prune juice multiple times upon PT arrival in order to have bowel movement.  As therapist was assessing pt in bed and brought bed sheets down to assess LE's, significant amount of liquid stool noted.  Nurse notified and came to assess.  Pt max assist for logrolling R in bed for clean-up.  Nursing staff present with pt assisting with clean-up (therapy session ended).  Pt would benefit from skilled PT to address noted impairments and functional limitations (see below for any additional details).  Upon hospital discharge, anticipate pt will be able to return home with 24/7 assist and HHPT (per chart review pt has hoyer lift, hospital bed, BSC, and w/c at home already).    Follow Up Recommendations Supervision/Assistance - 24 hour;Home health PT    Equipment Recommendations  Hospital bed;Other (comment);3in1 (PT);Wheelchair (measurements PT);Wheelchair cushion (measurements PT)(hoyer lift (per chart pt already has this DME))    Recommendations for Other Services       Precautions / Restrictions Precautions Precautions: Fall Restrictions Weight Bearing Restrictions: No      Mobility  Bed Mobility Overal bed mobility: Needs Assistance Bed Mobility: Rolling Rolling: Max  assist         General bed mobility comments: assist to initiate and logroll to R side for clean-up  Transfers                 General transfer comment: Deferred d/t needing clean-up  Ambulation/Gait             General Gait Details: Deferred d/t needing clean-up  Stairs            Wheelchair Mobility    Modified Rankin (Stroke Patients Only)       Balance                                             Pertinent Vitals/Pain Pain Assessment: Faces Faces Pain Scale: No hurt Pain Intervention(s): Limited activity within patient's tolerance;Monitored during session;Repositioned  Vitals stable and WFL throughout treatment session.    Home Living Family/patient expects to be discharged to:: [facility] bed;Other (comment);Walker - 2 wheels;Bedside commode;Shower seat;Wheelchair - manual(hoyer lift; trilogy machine) Additional Comments: Pt reports living with her husband but reports that she does not remember when asked home set up questions.    Prior Function Level of Independence: Needs assistance   Gait / Transfers Assistance Needed: Per chart review pt able to transfer and take a few steps (but unable to ambulate long distances).  ADL's / Homemaking Assistance Needed: Per chart review pt required  assist for ADL's and feeding.  Comments: Pt reports that she does not remember when asked PLOF questions.  Per chart review pt's son and daughter assist a lot.  Has home O2.     Hand Dominance        Extremity/Trunk Assessment   Upper Extremity Assessment Upper Extremity Assessment: Generalized weakness;Difficult to assess due to impaired cognition(R shoulder flexion AAROM to grossly 80 degrees; R elbow flexion/extension WFL; deferred L UE d/t bowel noted on pt's hand and needing clean-up)    Lower Extremity Assessment Lower Extremity Assessment: Generalized weakness;Difficult to assess due to  impaired cognition       Communication   Communication: HOH  Cognition Arousal/Alertness: Awake/alert Behavior During Therapy: Flat affect Overall Cognitive Status: No family/caregiver present to determine baseline cognitive functioning                                 General Comments: Oriented to name but not DOB; oriented to hospital      General Comments General comments (skin integrity, edema, etc.): Wounds noted B toes/feet.  Nursing cleared pt for participation in physical therapy.  Pt agreeable to PT session.    Exercises     Assessment/Plan    PT Assessment Patient needs continued PT services  PT Problem List Decreased strength;Decreased activity tolerance;Decreased balance;Decreased mobility;Decreased cognition;Decreased knowledge of use of DME;Decreased knowledge of precautions       PT Treatment Interventions DME instruction;Gait training;Functional mobility training;Therapeutic activities;Therapeutic exercise;Balance training;Patient/family education    PT Goals (Current goals can be found in the Care Plan section)  Acute Rehab PT Goals Patient Stated Goal: to improve mobility PT Goal Formulation: With patient Time For Goal Achievement: 01/09/19 Potential to Achieve Goals: Fair    Frequency Min 2X/week   Barriers to discharge        Co-evaluation               AM-PAC PT "6 Clicks" Mobility  Outcome Measure Help needed turning from your back to your side while in a flat bed without using bedrails?: A Lot Help needed moving from lying on your back to sitting on the side of a flat bed without using bedrails?: A Lot Help needed moving to and from a bed to a chair (including a wheelchair)?: A Lot Help needed standing up from a chair using your arms (e.g., wheelchair or bedside chair)?: A Lot Help needed to walk in hospital room?: Total Help needed climbing 3-5 steps with a railing? : Total 6 Click Score: 10    End of Session    Activity Tolerance: Other (comment)(pt reporting feeling nauseas (nurse notified and pt with emesis bag)) Patient left: in bed;with nursing/sitter in room(nursing staff present assisting pt with clean-up) Nurse Communication: Mobility status;Precautions PT Visit Diagnosis: Other abnormalities of gait and mobility (R26.89);Muscle weakness (generalized) (M62.81);Difficulty in walking, not elsewhere classified (R26.2)    Time: 5427-0623 PT Time Calculation (min) (ACUTE ONLY): 19 min   Charges:   PT Evaluation $PT Eval Low Complexity: 1 Low         [place]ton, PT 12/26/18, 4:40 PM [phone]

## 2018-12-26 NOTE — Progress Notes (Signed)
[place] at [place] NAME: [name]    MR#:  [id number]  DATE OF BIRTH:  Apr 24, 1941  SUBJECTIVE:  CHIEF COMPLAINT: heaving  answering most of the questions  REVIEW OF SYSTEMS:  CONSTITUTIONAL: No fever, fatigue or weakness.  EYES: No blurred or double vision.  EARS, NOSE, AND THROAT: No tinnitus or ear pain.  RESPIRATORY: No cough, shortness of breath, wheezing or hemoptysis.  CARDIOVASCULAR: No chest pain, orthopnea, edema.  GASTROINTESTINAL: No nausea, vomiting, diarrhea or abdominal pain.  HEMATOLOGY: No anemia, easy bruising or bleeding SKIN: No rash or lesion. MUSCULOSKELETAL: No joint pain or arthritis.   NEUROLOGIC: No tingling, numbness, weakness.  PSYCHIATRY: No anxiety or depression.   DRUG ALLERGIES:   Allergies  Allergen Reactions  . Iodine Anaphylaxis  . Penicillins Anaphylaxis    Tolerates ceftriaxone, cefazolin Has patient had a PCN reaction causing immediate rash, facial/tongue/throat swelling, SOB or lightheadedness with hypotension: Unknown Has patient had a PCN reaction causing severe rash involving mucus membranes or skin necrosis: Unknown Has patient had a PCN reaction that required hospitalization: Unknown Has patient had a PCN reaction occurring within the last 10 years: Unknown If all of the above answers are "NO", then may proceed with Cephalosporin u  . Shellfish Allergy Anaphylaxis  . Shellfish-Derived Products Anaphylaxis  . Sulfa Antibiotics Anaphylaxis  . Sulfacetamide Sodium Anaphylaxis  . Sulfasalazine Anaphylaxis  . Eggs Or Egg-Derived Products     Other reaction(s): SHORTNESS OF BREATH  . Morphine And Related Other (See Comments)    Altered mental status    VITALS:  Blood pressure 95/73, pulse 83, temperature 97.9 F (36.6 C), temperature source Oral, resp. rate 15, height '5\' 6"'  (1.676 m), weight 77.6 kg, SpO2 96 %.  PHYSICAL EXAMINATION:  GENERAL:  78 y.o.-year-old patient lying  in the bed with no acute distress. Chronically ill EYES: Pupils equal, round, reactive to light and accommodation. No scleral icterus. Extraocular muscles intact.  HEENT: Head atraumatic, normocephalic. Oropharynx and nasopharynx clear.  NECK:  Supple, no jugular venous distention. No thyroid enlargement, no tenderness.  LUNGS: Normal breath sounds bilaterally, no wheezing, rales,rhonchi or crepitation. No use of accessory muscles of respiration.  CARDIOVASCULAR: S1, S2 normal. No murmurs, rubs, or gallops.  ABDOMEN: Soft, nontender, nondistended. Bowel sounds present. No organomegaly or mass.  EXTREMITIES: No pedal edema, cyanosis, or clubbing.  NEUROLOGIC: Awake alert, oriented x2 sensation intact. Gait not checked. Moves all extremities well. Overall deconditioned/week PSYCHIATRIC: The patient is alert and oriented x2- 3.  SKIN: No obvious rash, lesion, or ulcer.    LABORATORY PANEL:   CBC Recent Labs  Lab 12/26/18 0449  WBC 11.8*  HGB 13.3  HCT 40.5  PLT 337   ------------------------------------------------------------------------------------------------------------------  Chemistries  Recent Labs  Lab 12/23/18 1540  12/26/18 0449  NA 139   < > 135  K 3.8   < > 3.1*  CL 101   < > 96*  CO2 27   < > 29  GLUCOSE 290*   < > 152*  BUN 12   < > 12  CREATININE 0.57   < > 0.50  CALCIUM 9.4   < > 8.7*  MG  --    < > 2.0  AST 11*  --   --   ALT 9  --   --   ALKPHOS 69  --   --   BILITOT 0.6  --   --    < > = values  in this interval not displayed.   ------------------------------------------------------------------------------------------------------------------  Cardiac Enzymes Recent Labs  Lab 12/24/18 0044  TROPONINI 0.40*   ------------------------------------------------------------------------------------------------------------------  RADIOLOGY:  No results found.  EKG:   Orders placed or performed during the hospital encounter of 12/23/18  . ED EKG  .  ED EKG  . EKG 12-Lead  . EKG 12-Lead    ASSESSMENT AND PLAN:   [name]  is a 78 y.o. female with a known history of insulin requiring diabetes mellitus, GERD, hyperlipidemia, hypertension, old history of stroke is brought into the ED for altered mental status via EMS  #Acute metabolic encephalopathy from sepsis with E. coli Patient met septic criteria with leukocytosis and tachycardia Blood cultures with E. coli, urine cultures pending.  MRSA  PCR positive Chest x-ray negative On broad-spectrum IV antibiotics meropenem COVID test negative flu test negative Hydrate with IV fluids  CT head no acute findings  #Diabetes mellitus-insulin requiring Patient takes Lantus 6 units nightly and sliding scale insulin   #Hypertension continue Coreg and amlodipine  #Elevated troponin-probably demand ischemia from malignant hypertension  - Patient denies any chest pain today -Cycle cardiac biomarkers-troponin 0 0.33-0.40 -Echocardiogram left ventricle has low normal systolic function, with an ejection fraction of 50-55%. The cavity size was normal. There is moderately increased left ventricular wall thickness. Left ventricular diastolic Doppler parameters are indeterminate. -Cardiology  Dr. Nehemiah Massed notified, follow-up with him.  No cardiac interventions needed at this time  #Chronic stable meningioma-CT head findings Continue close monitoring  #GI prophylaxis with Protonix  # DVT prophylaxis with Lovenox subcu  PT to see pt  All the records are reviewed and case discussed with Care Management/Social Workerr. Management plans discussed with the patient, daughter [name] and they are in agreement.  CODE STATUS:  fc   TOTAL TIME TAKING CARE OF THIS PATIENT: 36  minutes.   POSSIBLE D/C IN 1-2  DAYS, DEPENDING ON CLINICAL CONDITION.  Note: This dictation was prepared with Dragon dictation along with smaller phrase technology. Any transcriptional errors that result from this process  are unintentional.   Fritzi Mandes M.D on 12/26/2018 at 3:03 PM  Between 7am to 6pm - Pager - [phone] After 6pm go to [email or web address] - password [email or web address]  [place] [facility]  Office  [phone]  CC: Primary care physician; [name], MD

## 2018-12-27 ENCOUNTER — Inpatient Hospital Stay: Payer: Self-pay

## 2018-12-27 LAB — RENAL FUNCTION PANEL
Albumin: 2.5 g/dL — ABNORMAL LOW (ref 3.5–5.0)
Anion gap: 8 (ref 5–15)
BUN: 14 mg/dL (ref 8–23)
CO2: 28 mmol/L (ref 22–32)
Calcium: 9.1 mg/dL (ref 8.9–10.3)
Chloride: 100 mmol/L (ref 98–111)
Creatinine, Ser: 0.59 mg/dL (ref 0.44–1.00)
GFR calc Af Amer: >60 mL/min
GFR calc non Af Amer: >60 mL/min
Glucose, Bld: 158 mg/dL — ABNORMAL HIGH (ref 70–99)
Phosphorus: 2.2 mg/dL — ABNORMAL LOW (ref 2.5–4.6)
Potassium: 4.1 mmol/L (ref 3.5–5.1)
Sodium: 136 mmol/L (ref 135–145)

## 2018-12-27 LAB — GLUCOSE, CAPILLARY
Glucose-Capillary: 135 mg/dL — ABNORMAL HIGH (ref 70–99)
Glucose-Capillary: 143 mg/dL — ABNORMAL HIGH (ref 70–99)
Glucose-Capillary: 146 mg/dL — ABNORMAL HIGH (ref 70–99)
Glucose-Capillary: 146 mg/dL — ABNORMAL HIGH (ref 70–99)
Glucose-Capillary: 153 mg/dL — ABNORMAL HIGH (ref 70–99)
Glucose-Capillary: 158 mg/dL — ABNORMAL HIGH (ref 70–99)
Glucose-Capillary: 161 mg/dL — ABNORMAL HIGH (ref 70–99)

## 2018-12-27 LAB — MAGNESIUM: Magnesium: 2.1 mg/dL (ref 1.7–2.4)

## 2018-12-27 MED ORDER — SODIUM CHLORIDE 0.9% FLUSH
10.0000 mL | Freq: Two times a day (BID) | INTRAVENOUS | Status: DC
  Administered 2018-12-27: 22:00:00 10 mL

## 2018-12-27 MED ORDER — TORSEMIDE 20 MG PO TABS
20.0000 mg | ORAL_TABLET | Freq: Two times a day (BID) | ORAL | Status: DC
  Administered 2018-12-27 – 2018-12-28 (×2): 20 mg via ORAL
  Filled 2018-12-27 (×2): qty 1

## 2018-12-27 MED ORDER — SODIUM CHLORIDE 0.9% FLUSH: 10.0000 mL | INTRAVENOUS | Status: DC | PRN

## 2018-12-27 NOTE — Progress Notes (Signed)
[place] [facility] NAME: [name]    MR#:  [id number]  DATE OF BIRTH:  [birth year]  SUBJECTIVE:  no new complaints. Eating well. Had good bowel movement. Nausea subsided  REVIEW OF SYSTEMS:  CONSTITUTIONAL: No fever, fatigue or weakness.  EYES: No blurred or double vision.  EARS, NOSE, AND THROAT: No tinnitus or ear pain.  RESPIRATORY: No cough, shortness of breath, wheezing or hemoptysis.  CARDIOVASCULAR: No chest pain, orthopnea, edema.  GASTROINTESTINAL: No nausea, vomiting, diarrhea or abdominal pain.  HEMATOLOGY: No anemia, easy bruising or bleeding SKIN: No rash or lesion. MUSCULOSKELETAL: No joint pain or arthritis.   NEUROLOGIC: No tingling, numbness, weakness.  PSYCHIATRY: No anxiety or depression.   DRUG ALLERGIES:   Allergies  Allergen Reactions  . Iodine Anaphylaxis  . Penicillins Anaphylaxis    Tolerates ceftriaxone, cefazolin Has patient had a PCN reaction causing immediate rash, facial/tongue/throat swelling, SOB or lightheadedness with hypotension: Unknown Has patient had a PCN reaction causing severe rash involving mucus membranes or skin necrosis: Unknown Has patient had a PCN reaction that required hospitalization: Unknown Has patient had a PCN reaction occurring within the last 10 years: Unknown If all of the above answers are "NO", then may proceed with Cephalosporin u  . Shellfish Allergy Anaphylaxis  . Shellfish-Derived Products Anaphylaxis  . Sulfa Antibiotics Anaphylaxis  . Sulfacetamide Sodium Anaphylaxis  . Sulfasalazine Anaphylaxis  . Eggs Or Egg-Derived Products     Other reaction(s): SHORTNESS OF BREATH  . Morphine And Related Other (See Comments)    Altered mental status    VITALS:  Blood pressure 108/60, pulse 84, temperature (!) 97.5 F (36.4 C), temperature source Oral, resp. rate 18, height _0  (1.676 m), weight 77.6 kg, SpO2 96 %.  PHYSICAL EXAMINATION:  GENERAL:  78  y.o.-year-old patient lying in the bed with no acute distress. Chronically ill EYES: Pupils equal, round, reactive to light and accommodation. No scleral icterus. Extraocular muscles intact.  HEENT: Head atraumatic, normocephalic. Oropharynx and nasopharynx clear.  NECK:  Supple, no jugular venous distention. No thyroid enlargement, no tenderness.  LUNGS: Normal breath sounds bilaterally, no wheezing, rales,rhonchi or crepitation. No use of accessory muscles of respiration.  CARDIOVASCULAR: S1, S2 normal. No murmurs, rubs, or gallops.  ABDOMEN: Soft, nontender, nondistended. Bowel sounds present. No organomegaly or mass.  EXTREMITIES: No pedal edema, cyanosis, or clubbing.  NEUROLOGIC: Awake alert, oriented x2 sensation intact. Gait not checked. Moves all extremities well. Overall deconditioned/week PSYCHIATRIC: The patient is alert and oriented x2- 3.  SKIN: No obvious rash, lesion, or ulcer.    LABORATORY PANEL:   CBC Recent Labs  Lab 12/26/18 0449  WBC 11.8*  HGB 13.3  HCT 40.5  PLT 337   ------------------------------------------------------------------------------------------------------------------  Chemistries  Recent Labs  Lab 12/23/18 1540  12/27/18 0635  NA 139   < > 136  K 3.8   < > 4.1  CL 101   < > 100  CO2 27   < > 28  GLUCOSE 290*   < > 158*  BUN 12   < > 14  CREATININE 0.57   < > 0.59  CALCIUM 9.4   < > 9.1  MG  --    < > 2.1  AST 11*  --   --   ALT 9  --   --   ALKPHOS 69  --   --   BILITOT 0.6  --   --    < > =  values in this interval not displayed.   ------------------------------------------------------------------------------------------------------------------  Cardiac Enzymes Recent Labs  Lab 12/24/18 0044  TROPONINI 0.40*   ------------------------------------------------------------------------------------------------------------------  RADIOLOGY:  [place] Ekg Site Rite  Result Date: 12/27/2018 If Site Rite image not attached, placement  could not be confirmed due to current cardiac rhythm.   EKG:   Orders placed or performed during the hospital encounter of 12/23/18  . ED EKG  . ED EKG  . EKG 12-Lead  . EKG 12-Lead    ASSESSMENT AND PLAN:   [name]  is a 78 y.o. female with a known history of insulin requiring diabetes mellitus, GERD, hyperlipidemia, hypertension, old history of stroke is brought into the ED for altered mental status via EMS  #Acute metabolic encephalopathy from sepsis with E. coli -Patient met septic criteria with leukocytosis and tachycardia -Blood cultures with E. coli, urine cultures pending.  MRSA  PCR positive Chest x-ray negative -On broad-spectrum IV antibiotics meropenem-- patient will need PICC line for IV antibiotics at home. This was discussed with patient's daughter [name] and updated. -COVID test negative flu test negative -CT head no acute findings  #Diabetes mellitus-insulin requiring -Patient takes Lantus 6 units nightly and sliding scale insulin   #Hypertension continue Coreg and amlodipine  #Elevated troponin-probably demand ischemia from malignant hypertension  - Patient denies any chest pain today -Cycle cardiac biomarkers-troponin 0 0.33-0.40 -Echocardiogram left ventricle has low normal systolic function, with an ejection fraction of 50-55%. The cavity size was normal. There is moderately increased left ventricular wall thickness. Left ventricular diastolic Doppler parameters are indeterminate. -Cardiology  Dr. Nehemiah Massed notified, follow-up with him.  No cardiac interventions needed at this time  #Chronic stable meningioma-CT head findings Continue close monitoring  #GI prophylaxis with Protonix  # DVT prophylaxis with Lovenox subcu  PT evaluation noted. Will arrange home health PT and RN. Dissipate discharge going home tomorrow with IV antibiotics once PICC line is placed. Daughter is in agreement.  CODE STATUS:  fc   TOTAL TIME TAKING CARE OF THIS  PATIENT: 30  minutes.   POSSIBLE D/C IN 1-2  DAYS, DEPENDING ON CLINICAL CONDITION.  Note: This dictation was prepared with Dragon dictation along with smaller phrase technology. Any transcriptional errors that result from this process are unintentional.   Fritzi Mandes M.D on 12/27/2018 at 1:49 PM  Between 7am to 6pm - Pager - [phone] After 6pm go to [email or web address] - password [email or web address] [facility]  [facility]  Office  [phone]  CC: Primary care physician; [name], MD

## 2018-12-27 NOTE — Progress Notes (Signed)
Pharmacy Electrolyte Monitoring Consult:  Pharmacy consulted to assist in monitoring and replacing electrolytes in this 78 y.o. female admitted on 12/23/2018 with Altered Mental Status   Labs:  Sodium (mmol/L)  Date Value  12/27/2018 136   Potassium (mmol/L)  Date Value  12/27/2018 4.1   Magnesium (mg/dL)  Date Value  12/27/2018 2.1   Phosphorus (mg/dL)  Date Value  12/27/2018 2.2 (L)   Calcium (mg/dL)  Date Value  12/27/2018 9.1   Albumin (g/dL)  Date Value  12/27/2018 2.5 (L)    Assessment/Plan: Patient receiving  furosemide 20mg  IV Q12hr.   No replacement warranted.   Will obtain electrolytes with am labs. If electrolytes remain in range, will consider every 48-72 hour electrolytes checks.   Will replace to maintain within normal limits.   Pharmacy will continue to monitor and adjust per consult.   Kiyo Heal L 12/27/2018 8:42 AM

## 2018-12-27 NOTE — TOC Progression Note (Signed)
Transition of Care [facility]) - Progression Note    Patient Details  Name: [name] MRN: [id number] Date of Birth: 04/01/1941  [facility] CM/SW Contact  Latanya Maudlin, RN Phone Number: 12/27/2018, 12:41 PM  Clinical Narrative: [facility] team was contacted by MD about discharge plans for tomorrow. Patient is set to leave with home health. Jason from [facility] following. Notified Pam from [facility] of need for home antibiotics. Pam to coordinate education session with son [name]      Expected Discharge Plan: [place] Barriers to Discharge: Continued Medical Work up  Expected Discharge Plan and Services Expected Discharge Plan: [place]   Discharge Planning Services: CM Consult Post Acute Care Choice: [place] arrangements for the past 2 months: Single Family Home                           HH Arranged: PT, Nurse's Aide, RN [place] Agency: [place] ([facility]) Date HH Agency Contacted: 12/24/18 Time [place]: 1346 Representative spoke with at [place] city : [place] ([place]) Interventions    Readmission Risk Interventions Readmission Risk Prevention Plan 12/24/2018  [place] or Home Care Consult Complete  Some recent data might be hidden

## 2018-12-27 NOTE — Progress Notes (Signed)
Peripherally Inserted Central Catheter/Midline Placement  The IV Nurse has discussed with the patient and/or persons authorized to consent for the patient, the purpose of this procedure and the potential benefits and risks involved with this procedure.  The benefits include less needle sticks, lab draws from the catheter, and the patient may be discharged home with the catheter. Risks include, but not limited to, infection, bleeding, blood clot (thrombus formation), and puncture of an artery; nerve damage and irregular heartbeat and possibility to perform a PICC exchange if needed/ordered by physician.  Alternatives to this procedure were also discussed.  Bard Power PICC patient education guide, fact sheet on infection prevention and patient information card has been provided to patient /or left at bedside.    PICC/Midline Placement Documentation  PICC Single Lumen 12/27/18 PICC Left Brachial 41 cm 2 cm (Active)  Indication for Insertion or Continuance of Line Home intravenous therapies (PICC only) 12/27/2018  5:00 PM  Exposed Catheter (cm) 2 cm 12/27/2018  5:00 PM  Site Assessment Clean;Dry;Intact 12/27/2018  5:00 PM  Line Status Flushed;Blood return noted 12/27/2018  5:00 PM  Dressing Type Transparent 12/27/2018  5:00 PM  Dressing Status Clean;Dry;Intact;Antimicrobial disc in place 12/27/2018  5:00 PM  Dressing Change Due 01/03/19 12/27/2018  5:00 PM    Telephone consent   Mickeal Skinner 12/27/2018, 5:27 PM

## 2018-12-28 LAB — MAGNESIUM: Magnesium: 2 mg/dL (ref 1.7–2.4)

## 2018-12-28 LAB — GLUCOSE, CAPILLARY
Glucose-Capillary: 91 mg/dL (ref 70–99)
Glucose-Capillary: 122 mg/dL — ABNORMAL HIGH (ref 70–99)
Glucose-Capillary: 162 mg/dL — ABNORMAL HIGH (ref 70–99)

## 2018-12-28 LAB — BASIC METABOLIC PANEL
Anion gap: 10 (ref 5–15)
BUN: 12 mg/dL (ref 8–23)
CO2: 29 mmol/L (ref 22–32)
Calcium: 8.6 mg/dL — ABNORMAL LOW (ref 8.9–10.3)
Chloride: 96 mmol/L — ABNORMAL LOW (ref 98–111)
Creatinine, Ser: 0.49 mg/dL (ref 0.44–1.00)
GFR calc Af Amer: >60 mL/min (ref >60)
GFR calc non Af Amer: >60 mL/min (ref >60)
Glucose, Bld: 109 mg/dL — ABNORMAL HIGH (ref 70–99)
Potassium: 3 mmol/L — ABNORMAL LOW (ref 3.5–5.1)
Sodium: 135 mmol/L (ref 135–145)

## 2018-12-28 LAB — PHOSPHORUS: Phosphorus: 2.4 mg/dL — ABNORMAL LOW (ref 2.5–4.6)

## 2018-12-28 MED ORDER — TORSEMIDE 20 MG PO TABS: 20.0000 mg | ORAL_TABLET | Freq: Two times a day (BID) | ORAL | Status: DC

## 2018-12-28 MED ORDER — ERTAPENEM IV (FOR PTA / DISCHARGE USE ONLY): 1.0000 g | INTRAVENOUS | 0 refills | Status: AC

## 2018-12-28 MED ORDER — HYDRALAZINE HCL 25 MG PO TABS: 25.0000 mg | ORAL_TABLET | Freq: Three times a day (TID) | ORAL | 0 refills | Status: DC

## 2018-12-28 MED ORDER — POTASSIUM PHOSPHATE MONOBASIC 500 MG PO TABS
500.0000 mg | ORAL_TABLET | Freq: Once | ORAL | Status: AC
  Administered 2018-12-28: 12:00:00 500 mg via ORAL
  Filled 2018-12-28: qty 1

## 2018-12-28 MED ORDER — CARVEDILOL 12.5 MG PO TABS: 6.2500 mg | ORAL_TABLET | Freq: Two times a day (BID) | ORAL | 1 refills | Status: DC

## 2018-12-28 MED ORDER — POTASSIUM CHLORIDE CRYS ER 20 MEQ PO TBCR
40.0000 meq | EXTENDED_RELEASE_TABLET | Freq: Once | ORAL | Status: AC
  Administered 2018-12-28: 09:00:00 40 meq via ORAL
  Filled 2018-12-28: qty 2

## 2018-12-28 MED ORDER — SODIUM CHLORIDE 0.9 % IV SOLN
1.0000 g | INTRAVENOUS | Status: DC
  Administered 2018-12-28: 12:00:00 1000 mg via INTRAVENOUS
  Filled 2018-12-28 (×2): qty 1

## 2018-12-28 MED ORDER — POTASSIUM PHOSPHATES 15 MMOLE/5ML IV SOLN
15.0000 mmol | Freq: Once | INTRAVENOUS | Status: AC
  Administered 2018-12-28: 10:00:00 15 mmol via INTRAVENOUS
  Filled 2018-12-28: qty 5

## 2018-12-28 MED ORDER — INSULIN GLARGINE 100 UNIT/ML ~~LOC~~ SOLN: 15.0000 [IU] | Freq: Every day | SUBCUTANEOUS | 0 refills | Status: DC

## 2018-12-28 NOTE — Discharge Summary (Signed)
[facility] NAME: [name]    MR#:  [id number]  DATE OF BIRTH:  Feb 08, 1941  DATE OF ADMISSION:  12/23/2018 ADMITTING PHYSICIAN: Nicholes Mango, MD  DATE OF DISCHARGE: 12/28/2018  PRIMARY CARE PHYSICIAN: [name], MD    ADMISSION DIAGNOSIS:  SIRS (systemic inflammatory response syndrome) (HCC) [R65.10] Acute cystitis with hematuria [N30.01] Elevated troponin I level [R79.89] Altered mental status, unspecified altered mental status type [R41.82] HTN (hypertension), malignant [I10]  DISCHARGE DIAGNOSIS:  E. coli sepsis E. coli UTI  SECONDARY DIAGNOSIS:   Past Medical History:  Diagnosis Date  . Acute heart failure ([place])   . Aortic stenosis   . CHF (congestive heart failure) ([place])   . Complication of anesthesia    Hard to wake patient up after having anesthesia  . Diabetes mellitus without complication ([place])   . Diabetic neuropathy (HCC)    Takes Lyrica  . Edema of both legs    Takes Lasix  . GERD (gastroesophageal reflux disease)   . High cholesterol   . HTN (hypertension)   . Hypertension   . PAD (peripheral artery disease) ([place])   . Shortness of breath dyspnea   . Spasm of back muscles   . Stroke ([place])   . Wound, open    Right great toe    HOSPITAL COURSE:   [place] a78 y.o.femalewith a known history of insulin requiring diabetes mellitus, GERD, hyperlipidemia, hypertension, old history of stroke is brought into the ED for altered mental status via EMS  #Acute metabolic encephalopathy from sepsis with E. coli -Patient met septic criteria with leukocytosis and tachycardia--now resolved -Blood cultures with E. Coli - MRSA  PCR positive -Chest x-ray negative -On broad-spectrum IV antibiotics meropenem-- patient s/p PICC line for IV ertapenem (total 14 days)at home. This was discussed with patient's daughter [name] and updated. -COVID test negative flu test negative -CT head no acute  findings  #Diabetes mellitus-insulin requiring -cont lantus and SSI  #Hypertension  -continue Coreg and amlodipine  #Elevated troponin-probably demand ischemia from malignant hypertension  - Patient denies any chest pain today -Cycle cardiac biomarkers-troponin 0 0.33-0.40 -Echocardiogram left ventricle has low normal systolic function, with an ejection fraction of 50-55%. The cavity size was normal. There is moderately increased left ventricular wall thickness.  -Cardiology  Dr. Nehemiah Massed , follow-up with him.  No cardiac interventions needed at this time  #Chronic stable meningioma-CT head findings Continue close monitoring  #GI prophylaxis with Protonix  # DVT prophylaxis with Lovenox subcu  PT evaluation noted. Will arrange home health PT and RN.  discharge home today with IV antibiotics with PICC line.  CONSULTS OBTAINED:  Treatment Team:  [facility]cc[facility], [facility], MD  DRUG ALLERGIES:   Allergies  Allergen Reactions  . Iodine Anaphylaxis  . Penicillins Anaphylaxis    Tolerates ceftriaxone, cefazolin Has patient had a PCN reaction causing immediate rash, facial/tongue/throat swelling, SOB or lightheadedness with hypotension: Unknown Has patient had a PCN reaction causing severe rash involving mucus membranes or skin necrosis: Unknown Has patient had a PCN reaction that required hospitalization: Unknown Has patient had a PCN reaction occurring within the last 10 years: Unknown If all of the above answers are "NO", then may proceed with Cephalosporin u  . Shellfish Allergy Anaphylaxis  . Shellfish-Derived Products Anaphylaxis  . Sulfa Antibiotics Anaphylaxis  . Sulfacetamide Sodium Anaphylaxis  . Sulfasalazine Anaphylaxis  . Eggs Or Egg-Derived Products     Other reaction(s): SHORTNESS OF BREATH  .  Morphine And Related Other (See Comments)    Altered mental status    DISCHARGE MEDICATIONS:   Allergies as of 12/28/2018      Reactions   Iodine Anaphylaxis    Penicillins Anaphylaxis   Tolerates ceftriaxone, cefazolin Has patient had a PCN reaction causing immediate rash, facial/tongue/throat swelling, SOB or lightheadedness with hypotension: Unknown Has patient had a PCN reaction causing severe rash involving mucus membranes or skin necrosis: Unknown Has patient had a PCN reaction that required hospitalization: Unknown Has patient had a PCN reaction occurring within the last 10 years: Unknown If all of the above answers are "NO", then may proceed with Cephalosporin u   Shellfish Allergy Anaphylaxis   Shellfish-derived Products Anaphylaxis   Sulfa Antibiotics Anaphylaxis   Sulfacetamide Sodium Anaphylaxis   Sulfasalazine Anaphylaxis   Eggs Or Egg-derived Products    Other reaction(s): SHORTNESS OF BREATH   Morphine And Related Other (See Comments)   Altered mental status      Medication List    TAKE these medications   acetaminophen 500 MG tablet Commonly known as:  TYLENOL Take 1,000 mg by mouth every 6 (six) hours as needed for mild pain.   albuterol 108 (90 Base) MCG/ACT inhaler Commonly known as:  VENTOLIN HFA Inhale 2 puffs into the lungs every 6 (six) hours as needed for wheezing or shortness of breath.   amLODipine 10 MG tablet Commonly known as:  NORVASC Take 10 mg by mouth daily.   aspirin 325 MG tablet Take 1 tablet (325 mg total) by mouth daily with breakfast. What changed:  when to take this   atorvastatin 20 MG tablet Commonly known as:  LIPITOR Take 1 tablet (20 mg total) by mouth daily.   budesonide 0.25 MG/2ML nebulizer solution Commonly known as:  PULMICORT Take 2 mLs (0.25 mg total) by nebulization 2 (two) times daily.   carvedilol 12.5 MG tablet Commonly known as:  COREG Take 0.5 tablets (6.25 mg total) by mouth 2 (two) times daily. What changed:  medication strength   cetirizine 10 MG tablet Commonly known as:  ZYRTEC Take 10 mg by mouth daily.   cholecalciferol 1000 units tablet Commonly known as:   VITAMIN D Take 2,000 Units by mouth daily.   clopidogrel 75 MG tablet Commonly known as:  PLAVIX Take 1 tablet (75 mg total) by mouth daily with breakfast.   docusate sodium 100 MG capsule Commonly known as:  COLACE Take 100 mg by mouth 2 (two) times daily.   ertapenem  IVPB Commonly known as:  INVANZ Inject 1 g into the vein daily for 14 days. Indication:  ESBL E. Coli Bacteremia Last Day of Therapy:  01/11/2019 Labs - Once weekly:  CBC/D and BMP, Labs - Every other week:  ESR and CRP   fluticasone 50 MCG/ACT nasal spray Commonly known as:  FLONASE Place 1 spray into both nostrils every evening.   hydrALAZINE 25 MG tablet Commonly known as:  APRESOLINE Take 1 tablet (25 mg total) by mouth every 8 (eight) hours.   insulin glargine 100 UNIT/ML injection Commonly known as:  LANTUS Inject 0.15 mLs (15 Units total) into the skin at bedtime. What changed:  how much to take   ipratropium-albuterol 0.5-2.5 (3) MG/3ML Soln Commonly known as:  DUONEB Take 3 mLs by nebulization 2 (two) times daily.   isosorbide dinitrate 30 MG tablet Commonly known as:  ISORDIL Take 30 mg by mouth daily.   modafinil 100 MG tablet Commonly known as:  PROVIGIL Take 2  tablets (200 mg total) by mouth daily.   nitroGLYCERIN 0.4 MG SL tablet Commonly known as:  NITROSTAT Place 1 tablet (0.4 mg total) under the tongue every 5 (five) minutes as needed for chest pain.   omeprazole 20 MG capsule Commonly known as:  PRILOSEC Take 1 capsule (20 mg total) by mouth daily.   polyethylene glycol 17 g packet Commonly known as:  MIRALAX / GLYCOLAX Take 17 g by mouth 2 (two) times daily.   potassium chloride SA 20 MEQ tablet Commonly known as:  K-DUR Take 20 mEq by mouth daily.   torsemide 20 MG tablet Commonly known as:  DEMADEX Take 1 tablet (20 mg total) by mouth 2 (two) times daily. What changed:  how much to take            Home Infusion Instuctions  (From admission, onward)          Start     Ordered   12/28/18 0000  Home infusion instructions [facility] May follow [facility] Dosing Protocol; May administer Cathflo as needed to maintain patency of vascular access device.; Flushing of vascular access device: per [facility] Protocol: 0.9% NaCl pre/post medica...    Question Answer Comment  Instructions May follow [place] Dosing Protocol   Instructions May administer Cathflo as needed to maintain patency of vascular access device.   Instructions Flushing of vascular access device: per [facility] Protocol: 0.9% NaCl pre/post medication administration and prn patency; Heparin 100 u/ml, 58m for implanted ports and Heparin 10u/ml, 565mfor all other central venous catheters.   Instructions May follow [facility] Anaphylaxis Protocol for First Dose Administration in the home: 0.9% NaCl at 25-50 ml/hr to maintain IV access for protocol meds. Epinephrine 0.3 ml IV/IM PRN and Benadryl 25-50 IV/IM PRN s/s of anaphylaxis.   Instructions [facility] Infusion Coordinator (RN) to assist per patient IV care needs in the home PRN.      12/28/18 1239          If you experience worsening of your admission symptoms, develop shortness of breath, life threatening emergency, suicidal or homicidal thoughts you must seek medical attention immediately by calling 911 or calling your MD immediately  if symptoms less severe.  You Must read complete instructions/literature along with all the possible adverse reactions/side effects for all the Medicines you take and that have been prescribed to you. Take any new Medicines after you have completely understood and accept all the possible adverse reactions/side effects.   Please note  You were cared for by a hospitalist during your hospital stay. If you have any questions about your discharge medications or the care you received while you were in the hospital after you are discharged, you can call the unit and asked to speak with the hospitalist on call if  the hospitalist that took care of you is not available. Once you are discharged, your primary care physician will handle any further medical issues. Please note that NO REFILLS for any discharge medications will be authorized once you are discharged, as it is imperative that you return to your primary care physician (or establish a relationship with a primary care physician if you do not have one) for your aftercare needs so that they can reassess your need for medications and monitor your lab values. Today   SUBJECTIVE    No new issues VITAL SIGNS:  Blood pressure 126/62, pulse 84, temperature 98.3 F (36.8 C), temperature source Axillary, resp. rate 19, height '5\' 6"'  (1.676  m), weight 77.6 kg, SpO2 93 %.  I/O:    Intake/Output Summary (Last 24 hours) at 12/28/2018 1303 Last data filed at 12/28/2018 1100 Gross per 24 hour  Intake 324.2 ml  Output 1075 ml  Net -750.8 ml    PHYSICAL EXAMINATION:  GENERAL:  78 y.o.-year-old patient lying in the bed with no acute distress.  EYES: Pupils equal, round, reactive to light and accommodation. No scleral icterus. Extraocular muscles intact.  HEENT: Head atraumatic, normocephalic. Oropharynx and nasopharynx clear.  NECK:  Supple, no jugular venous distention. No thyroid enlargement, no tenderness.  LUNGS: Normal breath sounds bilaterally, no wheezing, rales,rhonchi or crepitation. No use of accessory muscles of respiration.  CARDIOVASCULAR: S1, S2 normal. No murmurs, rubs, or gallops.  ABDOMEN: Soft, non-tender, non-distended. Bowel sounds present. No organomegaly or mass.  EXTREMITIES: No pedal edema, cyanosis, or clubbing. PICC + NEUROLOGIC: Cranial nerves II through XII are intact. Muscle strength 5/5 in all extremities. Sensation intact. Gait not checked.  PSYCHIATRIC: The patient is alert and oriented x 3.  SKIN: No obvious rash, lesion, or ulcer.   DATA REVIEW:   CBC  Recent Labs  Lab 12/26/18 0449  WBC 11.8*  HGB 13.3  HCT 40.5   PLT 337    Chemistries  Recent Labs  Lab 12/23/18 1540  12/28/18 0536  NA 139   < > 135  K 3.8   < > 3.0*  CL 101   < > 96*  CO2 27   < > 29  GLUCOSE 290*   < > 109*  BUN 12   < > 12  CREATININE 0.57   < > 0.49  CALCIUM 9.4   < > 8.6*  MG  --    < > 2.0  AST 11*  --   --   ALT 9  --   --   ALKPHOS 69  --   --   BILITOT 0.6  --   --    < > = values in this interval not displayed.    Microbiology Results   Recent Results (from the past 240 hour(s))  SARS Coronavirus 2 (CEPHEID - Performed in [facility] hospital lab), Hosp Order     Status: None   Collection Time: 12/23/18  9:55 AM  Result Value Ref Range Status   SARS Coronavirus 2 NEGATIVE NEGATIVE Final    Comment: (NOTE) If result is NEGATIVE SARS-CoV-2 target nucleic acids are NOT DETECTED. The SARS-CoV-2 RNA is generally detectable in upper and lower  respiratory specimens during the acute phase of infection. The lowest  concentration of SARS-CoV-2 viral copies this assay can detect is 250  copies / mL. A negative result does not preclude SARS-CoV-2 infection  and should not be used as the sole basis for treatment or other  patient management decisions.  A negative result may occur with  improper specimen collection / handling, submission of specimen other  than nasopharyngeal swab, presence of viral mutation(s) within the  areas targeted by this assay, and inadequate number of viral copies  (<250 copies / mL). A negative result must be combined with clinical  observations, patient history, and epidemiological information. If result is POSITIVE SARS-CoV-2 target nucleic acids are DETECTED. The SARS-CoV-2 RNA is generally detectable in upper and lower  respiratory specimens dur ing the acute phase of infection.  Positive  results are indicative of active infection with SARS-CoV-2.  Clinical  correlation with patient history and other diagnostic information is  necessary to determine patient  infection status.   Positive results do  not rule out bacterial infection or co-infection with other viruses. If result is PRESUMPTIVE POSTIVE SARS-CoV-2 nucleic acids MAY BE PRESENT.   A presumptive positive result was obtained on the submitted specimen  and confirmed on repeat testing.  While 2019 novel coronavirus  (SARS-CoV-2) nucleic acids may be present in the submitted sample  additional confirmatory testing may be necessary for epidemiological  and / or clinical management purposes  to differentiate between  SARS-CoV-2 and other Sarbecovirus currently known to infect humans.  If clinically indicated additional testing with an alternate test  methodology [phone]) is advised. The SARS-CoV-2 RNA is generally  detectable in upper and lower respiratory sp ecimens during the acute  phase of infection. The expected result is Negative. Fact Sheet for Patients:  StrictlyIdeas.no Fact Sheet for Healthcare Providers: BankingDealers.co.za This test is not yet approved or cleared by the [place] FDA and has been authorized for detection and/or diagnosis of SARS-CoV-2 by FDA under an Emergency Use Authorization (EUA).  This EUA will remain in effect (meaning this test can be used) for the duration of the COVID-19 declaration under Section 564(b)(1) of the Act, 21 U.S.C. section 360bbb-3(b)(1), unless the authorization is terminated or revoked sooner. Performed at [facility], [address] [place], [place] [address]   Urine Culture     Status: Abnormal   Collection Time: 12/23/18 11:13 AM  Result Value Ref Range Status   Specimen Description   Final    URINE, CLEAN CATCH Performed at [facility], [address], [place], [place] [address]    Special Requests   Final    NONE Performed at [facility], [place] [address], [place], [place] [address]    Culture (A)  Final    >=100,000 COLONIES/mL ESCHERICHIA COLI Confirmed  Extended Spectrum Beta-Lactamase Producer (ESBL).  In bloodstream infections from ESBL organisms, carbapenems are preferred over piperacillin/tazobactam. They are shown to have a lower risk of mortality.    Report Status 12/25/2018 FINAL  Final   Organism ID, Bacteria ESCHERICHIA COLI (A)  Final      Susceptibility   Escherichia coli - MIC*    AMPICILLIN >=32 RESISTANT Resistant     CEFAZOLIN >=64 RESISTANT Resistant     CEFTRIAXONE >=64 RESISTANT Resistant     CIPROFLOXACIN >=4 RESISTANT Resistant     GENTAMICIN <=1 SENSITIVE Sensitive     IMIPENEM <=0.25 SENSITIVE Sensitive     NITROFURANTOIN <=16 SENSITIVE Sensitive     TRIMETH/SULFA >=320 RESISTANT Resistant     AMPICILLIN/SULBACTAM >=32 RESISTANT Resistant     PIP/TAZO 8 SENSITIVE Sensitive     Extended ESBL POSITIVE Resistant     * >=100,000 COLONIES/mL ESCHERICHIA COLI  MRSA PCR Screening     Status: Abnormal   Collection Time: 12/23/18 12:47 PM  Result Value Ref Range Status   MRSA by PCR POSITIVE (A) NEGATIVE Final    Comment:        The GeneXpert MRSA Assay (FDA approved for NASAL specimens only), is one component of a comprehensive MRSA colonization surveillance program. It is not intended to diagnose MRSA infection nor to guide or monitor treatment for MRSA infections. RESULT CALLED TO, READ BACK BY AND VERIFIED WITH: BRANDI MCGHEE AT 4585 ON 12/23/2018 [place]. Performed at [facility], [address] [place], [place] [address]   Blood culture (routine x 2)     Status: Abnormal   Collection Time: 12/23/18  1:00 PM  Result Value Ref Range Status   Specimen Description   Final    BLOOD BLOOD RIGHT HAND Performed at [facility], [address] [place], [place] [address]    Special Requests   Final    BOTTLES DRAWN AEROBIC AND ANAEROBIC Blood Culture adequate volume Performed at [facility], [address] [place], [place] [address]    Culture  Setup Time   Final    GRAM NEGATIVE  RODS AEROBIC BOTTLE ONLY CRITICAL RESULT CALLED TO, READ BACK BY AND VERIFIED WITH: Tobie Lords [id number] [id number] 12/24/2018 HNM Performed at [facility], [place] [address], [place], [place] [address]    Culture (A)  Final    ESCHERICHIA COLI Confirmed Extended Spectrum Beta-Lactamase Producer (ESBL).  In bloodstream infections from ESBL organisms, carbapenems are preferred over piperacillin/tazobactam. They are shown to have a lower risk of mortality.    Report Status 12/26/2018 FINAL  Final   Organism ID, Bacteria ESCHERICHIA COLI  Final      Susceptibility   Escherichia coli - MIC*    AMPICILLIN >=32 RESISTANT Resistant     CEFAZOLIN >=64 RESISTANT Resistant     CEFEPIME >=64 RESISTANT Resistant     CEFTAZIDIME RESISTANT Resistant     CEFTRIAXONE >=64 RESISTANT Resistant     CIPROFLOXACIN >=4 RESISTANT Resistant     GENTAMICIN <=1 SENSITIVE Sensitive     IMIPENEM <=0.25 SENSITIVE Sensitive     TRIMETH/SULFA >=320 RESISTANT Resistant     AMPICILLIN/SULBACTAM >=32 RESISTANT Resistant     PIP/TAZO 16 SENSITIVE Sensitive     Extended ESBL POSITIVE Resistant     * ESCHERICHIA COLI  Blood Culture ID Panel (Reflexed)     Status: Abnormal   Collection Time: 12/23/18  1:00 PM  Result Value Ref Range Status   Enterococcus species NOT DETECTED NOT DETECTED Final   Listeria monocytogenes NOT DETECTED NOT DETECTED Final   Staphylococcus species NOT DETECTED NOT DETECTED Final   Staphylococcus aureus (BCID) NOT DETECTED NOT DETECTED Final   Streptococcus species NOT DETECTED NOT DETECTED Final   Streptococcus agalactiae NOT DETECTED NOT DETECTED Final   Streptococcus pneumoniae NOT DETECTED NOT DETECTED Final   Streptococcus pyogenes NOT DETECTED NOT DETECTED Final   Acinetobacter baumannii NOT DETECTED NOT DETECTED Final   Enterobacteriaceae species DETECTED (A) NOT DETECTED Final    Comment: Enterobacteriaceae represent a large family of gram-negative bacteria, not a single  organism. CRITICAL RESULT CALLED TO, READ BACK BY AND VERIFIED WITH: DAVID BESANTI PHARMD 0250 12/24/2018 HNM    Enterobacter cloacae complex NOT DETECTED NOT DETECTED Final   Escherichia coli DETECTED (A) NOT DETECTED Final    Comment: CRITICAL RESULT CALLED TO, READ BACK BY AND VERIFIED WITH: DAVID BESANTI PHARMD 0250 12/24/2018 HNM     Klebsiella oxytoca NOT DETECTED NOT DETECTED Final   Klebsiella pneumoniae NOT DETECTED NOT DETECTED Final   Proteus species NOT DETECTED NOT DETECTED Final   Serratia marcescens NOT DETECTED NOT DETECTED Final   Carbapenem resistance NOT DETECTED NOT DETECTED Final   Haemophilus influenzae NOT DETECTED NOT DETECTED Final   Neisseria meningitidis NOT DETECTED NOT DETECTED Final   Pseudomonas aeruginosa NOT DETECTED NOT DETECTED Final   Candida albicans NOT DETECTED NOT DETECTED Final   Candida glabrata NOT DETECTED NOT DETECTED Final   Candida krusei NOT DETECTED NOT DETECTED Final   Candida parapsilosis NOT DETECTED NOT DETECTED Final   Candida tropicalis NOT DETECTED NOT DETECTED Final    Comment: Performed at [facility]  [facility], [address] [place], [place] [address]  Blood culture (routine x 2)     Status: Abnormal   Collection Time: 12/23/18  1:22 PM  Result Value Ref Range Status   Specimen Description   Final    BLOOD BLOOD LEFT HAND Performed at [facility], [address] [place], [place] [address]    Special Requests   Final    BOTTLES DRAWN AEROBIC AND ANAEROBIC Blood Culture adequate volume Performed at [facility], [address], [place], [place] [address]    Culture  Setup Time   Final    GRAM NEGATIVE RODS AEROBIC BOTTLE ONLY CRITICAL RESULT CALLED TO, READ BACK BY AND VERIFIED WITH: Tobie Lords [id number] 12/24/2018 HNM Performed at [facility], [address] [place], [place] [address]    Culture (A)  Final    ESCHERICHIA COLI SUSCEPTIBILITIES PERFORMED ON PREVIOUS CULTURE WITHIN  THE LAST 5 DAYS. Performed at [facility], [place] [address], [place], [place] [address]    Report Status 12/26/2018 FINAL  Final    RADIOLOGY:  [place] Ekg Site Rite  Result Date: 12/27/2018 If Site Rite image not attached, placement could not be confirmed due to current cardiac rhythm.    CODE STATUS:     Code Status Orders  (From admission, onward)         Start     Ordered   12/28/18 0746  Full code  Continuous     12/28/18 0746        Code Status History    Date Active Date Inactive Code Status Order ID Comments User Context   06/03/2018 1511 06/10/2018 2216 Full Code [id number]  Vaughan Basta, MD ED   04/10/2018 1437 04/17/2018 1946 Full Code [id number]  Loletha Grayer, MD ED   08/29/2016 0516 09/17/2016 1938 Full Code [id number]  Etta Quill, DO ED   07/05/2016 1755 07/09/2016 2131 Full Code [id number]  Shela Leff, MD Inpatient   03/01/2016 1524 03/13/2016 2119 Full Code [id number]  [name], MD Inpatient   03/01/2016 1450 03/01/2016 1524 Full Code [id number]  Marliss Coots, PA-C Inpatient   03/01/2016 1104 03/01/2016 1450 Full Code [id number]  Roland Earl, MD Inpatient   02/28/2016 1915 03/01/2016 1104 Full Code [id number]  Ulyses Amor, PA-C Inpatient   02/12/2014 0708 02/13/2014 1850 Full Code [id number]  Oswald Hillock, MD Inpatient   02/08/2014 1317 02/10/2014 1758 Full Code [id number]  Wylene Simmer, MD Inpatient   02/03/2014 1459 02/08/2014 1317 Full Code [id number]  Radene Gunning, NP Inpatient      TOTAL TIME TAKING CARE OF THIS PATIENT: *40* minutes.    Fritzi Mandes M.D on 12/28/2018 at 1:03 PM  Between 7am to 6pm - Pager - [phone] After 6pm go to [email or web address] - password [email or web address] [facility]  Office  [phone]  CC: Primary care physician; [name], MD

## 2018-12-28 NOTE — Progress Notes (Signed)
Patient discharged with [facility]. Patient's belongings and D/C paperwork were sent with EMS. Patient leaves in good general physical condition.

## 2018-12-28 NOTE — Progress Notes (Signed)
Pharmacy Electrolyte Monitoring Consult:  Pharmacy consulted to assist in monitoring and replacing electrolytes in this 78 y.o. female admitted on 12/23/2018 with Altered Mental Status   Labs:  Sodium (mmol/L)  Date Value  12/28/2018 135   Potassium (mmol/L)  Date Value  12/28/2018 3.0 (L)   Magnesium (mg/dL)  Date Value  12/28/2018 2.0   Phosphorus (mg/dL)  Date Value  12/28/2018 2.4 (L)   Calcium (mg/dL)  Date Value  12/28/2018 8.6 (L)   Albumin (g/dL)  Date Value  12/27/2018 2.5 (L)    Assessment/Plan: K 3.0  Mag 2.0  Phos 2.4  Scr 0.49   Patient ordered torsemide 20 mg PO bid. Potassium Phosphate 15 mmol IV x 1 dose  KCL 40 meq PO x 1. Will f/u K at 1800  Will obtain electrolytes with am labs. If electrolytes remain in range, will consider every 48-72 hour electrolytes checks.   Will replace to maintain within normal limits.   Pharmacy will continue to monitor and adjust per consult.   [name] 12/28/2018 8:12 AM

## 2018-12-28 NOTE — TOC Transition Note (Signed)
Transition of Care [facility]) - CM/SW Discharge Note   Patient Details  Name: [name] MRN: [id number] Date of Birth: 1941/05/24  Transition of Care [facility]) CM/SW Contact:  Shelbie Hutching, RN Phone Number: 12/28/2018, 11:14 AM   Clinical Narrative:     Patient will discharge home today with home health and IV antibiotics.  Family requests EMS transport home., RN will arrange when patient is ready to be discharge.  Final next level of care: Home w Home Health Services Barriers to Discharge: Barriers Resolved   Patient Goals and CMS Choice   CMS Medicare.gov Compare Post Acute Care list provided to:: Patient Represenative (must comment)(son [name]) Choice offered to / list presented to : Adult Children  Discharge Placement                       Discharge Plan and Services   Discharge Planning Services: CM Consult Post Acute Care Choice: Home Health                    HH Arranged: RN, Nurse's Aide, IV Antibiotics HH Agency: [place] ([facility]) Date [place]: 12/28/18 Time HH Agency Contacted: 1100 Representative spoke with at [place]: Floydene Flock  also Carolynn Sayers with infusion  Social Determinants of Health (SDOH) Interventions     Readmission Risk Interventions Readmission Risk Prevention Plan 12/24/2018  [place] or Home Care Consult Complete  Some recent data might be hidden

## 2018-12-28 NOTE — Plan of Care (Signed)

## 2018-12-28 NOTE — TOC Progression Note (Signed)
Transition of Care [facility]) - Progression Note    Patient Details  Name: [name] MRN: [id number] Date of Birth: Nov 19, 1940  Transition of Care [facility]) CM/SW Contact  Shelbie Hutching, RN Phone Number: 12/28/2018, 12:33 PM  Clinical Narrative:    Referral faxed to OP palliative with [place] care to [phone].   Expected Discharge Plan: [place] Barriers to Discharge: Barriers Resolved  Expected Discharge Plan and Services Expected Discharge Plan: [place]   Discharge Planning Services: CM Consult Post Acute Care Choice: [place] arrangements for the past 2 months: [place]: RN, Nurse's Aide, IV Antibiotics HH Agency: [place] ([facility]) Date HH Agency Contacted: 12/28/18 Time HH Agency Contacted: 1100 Representative spoke with at [place]: Floydene Flock  also Carolynn Sayers with infusion   Social Determinants of Health (SDOH) Interventions    Readmission Risk Interventions Readmission Risk Prevention Plan 12/24/2018  [place] or Home Care Consult Complete  Some recent data might be hidden

## 2018-12-28 NOTE — Progress Notes (Signed)
Updated patient's daughter, [name].

## 2018-12-28 NOTE — Progress Notes (Signed)
Discharge instructions discussed with patient's spouse, [name], over telephone. Plan is to discharge to home today at 1500 hrs. [facility] to provide transport. [facility] to pick-up care tomorrow with in-home visit. Patient's spouse denies questions or concerns. Patient will DC with PICC in place.

## 2018-12-28 NOTE — Consult Note (Signed)
PHARMACY CONSULT NOTE FOR:  OUTPATIENT  PARENTERAL ANTIBIOTIC THERAPY (OPAT)  Indication: ESBL E. Coli Bacteremia Regimen: Ertapenem 1 g q24h IV x 14 days End date: 01/11/2019  IV antibiotic discharge orders are pended. To discharging provider:  please sign these orders via discharge navigator,  Select New Orders & click on the button choice - Manage This Unsigned Work.     Thank you for allowing pharmacy to be a part of this patient's care.   Paticia Stack, PharmD Pharmacy Resident  12/28/2018 10:28 AM

## 2019-01-05 ENCOUNTER — Telehealth: Payer: Self-pay | Admitting: Internal Medicine

## 2019-01-05 NOTE — Telephone Encounter (Signed)
01/04/2019  2:40pm: spoke to husband [name], who asked that we call back next week. Though I explained the scope of Palliative Care Services, he seemed a little distrustful.  Mr. Delosreyes mentioned that he has no computer or smart phone, so that visit would have to be by telephone contact only. [facility] home care is involved: RN Laverle Hobby (Cindi from [facility] will sent her a text) and PT Amy Small Lynch [phone]) following. I spoke to Amy, who will also introduce what Palliative Care services could provide. Amy saw patient today, and shared that patient is showing some signs of decline, including decreased oral intake. I will reach out next week.  Violeta Gelinas NP-C [phone]

## 2019-01-11 ENCOUNTER — Telehealth: Payer: Self-pay

## 2019-01-11 NOTE — Telephone Encounter (Signed)
Phone call placed to patient to introduce Palliative Care and to offer to schedule visit with NP. Spoke with patient's son who scheduled visit for 01/12/2019 @ 11:00 am.

## 2019-01-12 ENCOUNTER — Other Ambulatory Visit: Payer: Medicare HMO | Admitting: Internal Medicine

## 2019-01-12 ENCOUNTER — Other Ambulatory Visit: Payer: Self-pay

## 2019-01-12 ENCOUNTER — Encounter: Payer: Self-pay | Admitting: Internal Medicine

## 2019-01-12 DIAGNOSIS — Z515 Encounter for palliative care: Secondary | ICD-10-CM

## 2019-01-12 NOTE — Progress Notes (Signed)
June 9th, 2020 [place] Consult Note Telephone: [phone]  Fax: [phone]  Due to the current COVID-19 infection/crises, the family prefer, and have given their verbal consent for, a provider visit via telemedicine. HIPPA policies of confidentially were discussed. Video-audio (telehealth) contact was unable to be done due technical barriers from the patients side.  PATIENT NAME: [name] DOB: 1940/11/10 MRN: [id number]  PRIMARY CARE PROVIDER:   [name], MD  REFERRING PROVIDER:  [name], [place], [place] [address]  RESPONSIBLE PARTY:  (spouse) Kagan Hietpas [phone], [phone], [phone]. (son) Tyjanae Bartek [phone]. (dtr) Shatara Stanek [phone]   ASSESSMENT / RECOMMENDATONS:   *patient's husband [name] is the primary source of information. 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 She is conversant and on topic with her family, but tends to be quieter as of late. She is not confused. She naps a lot during the day but awakens easily. She has a positive pressure breathing device (not sure what type; trilogy vs BiPap vs CPAP) that she tolerates well but husband restricts use of device to 2-4 hour intervals as he thinks she is clearer headed then if she wears the device for longer intervals. We discusses the reasons for her being on the device (OSA) and perhaps he could trial her for longer periods of time and see if she might be clearer headed and feeling better. He plans to give this a try. The device was recently switched out d/t malfunctioning, but seems to be working well at this time. Otherwise patient wears O2 via [place]. Patient is currently receiving PT/aide services via [facility] Drema Dallas RN and and Duffy Rhody PT [phone]). Patient is bed bound (hospital bed with side rails), though she  can assist with turns. PT is working with her on being able to sit up. She is dependent for assist with transfers, with personal hygiene, and with dressing. PT is trying to obtain a reclining wheelchair as she tends to slip down in the ones she has.  Patient can feed herself but husband usually feeds her as he feels she eats more with his assist. Last recorded weight May 2020 is 171 lbs, but husband reports she appears much less than that now ("her legs are much smaller". She is 5'6". Her weight 6 months earlier was 220 lbs. Her appetite has diminished so that she eats only a few bites at a time throughout the day. She drinks Glucerna shakes tid. Spouse checks her CBG's daily and they run 140's-150's; rarely as high as 200. She is incontinent of bowel and bladder. She moves her bowels qd to tid; occasional hard stools. Patient's son had added a "bowel pill" to her meds tid. Spouse reports patient with adequate fluid intake and good urine output. Patient has developed a blackened 3rd toe on left foot, which will be evaluated tomorrow at office of PCP Dr. Sherril Cong.   3. Family Supports: Patient's husband is her Media planner and primary caregiver.  Daughter is a Chief Executive Officer in [place]. Son [name] visits lives about 10 miles and visits at least once a week so that his dad can get out of the home. [name] also delivers the groceries. Patient worked at [employer]). Married 51 years. Currently aide (temp via [place]) 2 x/week to assist with bathing but  historically and in the future Husband assumes these responsibilities. Husband is coping very well. He acknowledges it is hard work but he loves his wife and has no regrets. He is in good spirits and has a positive outlook.    4. [facility] Directive: Full code. Treat the treatable with aggressive measures. Husband mentions that it's hard to make these decisions prior to the event. I encouraged him to openly discuss this with his  wife.  5. Goals of Care: To keep patient at home for care.    6. Follow up Palliative Care Visit: will reach out in about 2 months to check in.  I spent 60 minutes providing this consultation,  from 11am to noon. More than 50% of the time in this consultation was spent coordinating communication.   HISTORY OF PRESENT ILLNESS:  LARYSSA HASSING is a 78 y.o. female with h/o DM (insulin requiring/neuropathy), GERD, HLD, HTN, peripheral artery disease (R popliteal to tibial bypass graft), meningioma (chronic stable), LE wounds, CHF, aortic stenosis, recurrent UTI's, OSA, and stroke. She is s/p hospital admission at [facility] 5/20-5/25/2020 where she was treated for urinary sepsis complicated by SIRS (systemic inflammatory response syndrome), malignant HTN, and altered mental status. She was sent home with a PIC line for continued IV antbx (14 day course). Palliative Care was asked to help address goals of care.   CODE STATUS: full code  PPS: 0% HOSPICE ELIGIBILITY/DIAGNOSIS: TBD  PAST MEDICAL HISTORY:  Past Medical History:  Diagnosis Date   Acute heart failure ([place])    Aortic stenosis    CHF (congestive heart failure) (HCC)    Complication of anesthesia    Hard to wake patient up after having anesthesia   Diabetes mellitus without complication (HCC)    Diabetic neuropathy (HCC)    Takes Lyrica   Edema of both legs    Takes Lasix   GERD (gastroesophageal reflux disease)    High cholesterol    HTN (hypertension)    Hypertension    PAD (peripheral artery disease) (HCC)    Shortness of breath dyspnea    Spasm of back muscles    Stroke (HCC)    Wound, open    Right great toe    SOCIAL HX:  Social History   Tobacco Use   Smoking status: Never Smoker   Smokeless tobacco: Never Used   Tobacco comment: Never smoked  Substance Use Topics   Alcohol use: No    Alcohol/week: 0.0 standard drinks    ALLERGIES:  Allergies  Allergen Reactions   Iodine Anaphylaxis    Penicillins Anaphylaxis    Tolerates ceftriaxone, cefazolin Has patient had a PCN reaction causing immediate rash, facial/tongue/throat swelling, SOB or lightheadedness with hypotension: Unknown Has patient had a PCN reaction causing severe rash involving mucus membranes or skin necrosis: Unknown Has patient had a PCN reaction that required hospitalization: Unknown Has patient had a PCN reaction occurring within the last 10 years: Unknown If all of the above answers are "NO", then may proceed with Cephalosporin u   Shellfish Allergy Anaphylaxis   Shellfish-Derived Products Anaphylaxis   Sulfa Antibiotics Anaphylaxis   Sulfacetamide Sodium Anaphylaxis   Sulfasalazine Anaphylaxis   Eggs Or Egg-Derived Products     Other reaction(s): SHORTNESS OF BREATH   Morphine And Related Other (See Comments)    Altered mental status     PERTINENT MEDICATIONS:  Outpatient Encounter Medications as of 01/12/2019  Medication Sig   acetaminophen (TYLENOL) 500 MG tablet Take 1,000 mg by mouth  every 6 (six) hours as needed for mild pain.   albuterol (PROVENTIL HFA;VENTOLIN HFA) 108 (90 Base) MCG/ACT inhaler Inhale 2 puffs into the lungs every 6 (six) hours as needed for wheezing or shortness of breath.    amLODipine (NORVASC) 10 MG tablet Take 10 mg by mouth daily.   aspirin 325 MG tablet Take 1 tablet (325 mg total) by mouth daily with breakfast. (Patient taking differently: Take 325 mg by mouth daily. )   atorvastatin (LIPITOR) 20 MG tablet Take 1 tablet (20 mg total) by mouth daily.   budesonide (PULMICORT) 0.25 MG/2ML nebulizer solution Take 2 mLs (0.25 mg total) by nebulization 2 (two) times daily.   carvedilol (COREG) 12.5 MG tablet Take 0.5 tablets (6.25 mg total) by mouth 2 (two) times daily.   cetirizine (ZYRTEC) 10 MG tablet Take 10 mg by mouth daily.    cholecalciferol (VITAMIN D) 1000 units tablet Take 2,000 Units by mouth daily.    clopidogrel (PLAVIX) 75 MG tablet Take 1 tablet  (75 mg total) by mouth daily with breakfast.   docusate sodium (COLACE) 100 MG capsule Take 100 mg by mouth 2 (two) times daily.   fluticasone (FLONASE) 50 MCG/ACT nasal spray Place 1 spray into both nostrils every evening.    hydrALAZINE (APRESOLINE) 25 MG tablet Take 1 tablet (25 mg total) by mouth every 8 (eight) hours.   insulin glargine (LANTUS) 100 UNIT/ML injection Inject 0.15 mLs (15 Units total) into the skin at bedtime.   ipratropium-albuterol (DUONEB) 0.5-2.5 (3) MG/3ML SOLN Take 3 mLs by nebulization 2 (two) times daily.   isosorbide dinitrate (ISORDIL) 30 MG tablet Take 30 mg by mouth daily.    modafinil (PROVIGIL) 100 MG tablet Take 2 tablets (200 mg total) by mouth daily.   nitroGLYCERIN (NITROSTAT) 0.4 MG SL tablet Place 1 tablet (0.4 mg total) under the tongue every 5 (five) minutes as needed for chest pain.   omeprazole (PRILOSEC) 20 MG capsule Take 1 capsule (20 mg total) by mouth daily.   polyethylene glycol (MIRALAX / GLYCOLAX) packet Take 17 g by mouth 2 (two) times daily.   potassium chloride SA (K-DUR,KLOR-CON) 20 MEQ tablet Take 20 mEq by mouth daily.   torsemide (DEMADEX) 20 MG tablet Take 1 tablet (20 mg total) by mouth 2 (two) times daily.   No facility-administered encounter medications on file as of 01/12/2019.     PHYSICAL EXAM:  Unable as telephonic visit only.  Julianne Handler, NP

## 2019-01-28 ENCOUNTER — Encounter (INDEPENDENT_AMBULATORY_CARE_PROVIDER_SITE_OTHER): Payer: Self-pay | Admitting: Vascular Surgery

## 2019-01-28 ENCOUNTER — Encounter (INDEPENDENT_AMBULATORY_CARE_PROVIDER_SITE_OTHER): Payer: Self-pay

## 2019-01-28 ENCOUNTER — Other Ambulatory Visit: Payer: Self-pay

## 2019-01-28 ENCOUNTER — Ambulatory Visit (INDEPENDENT_AMBULATORY_CARE_PROVIDER_SITE_OTHER): Payer: Medicare HMO | Admitting: Vascular Surgery

## 2019-01-28 VITALS — BP 126/88 | HR 81 | Resp 17

## 2019-01-28 DIAGNOSIS — I10 Essential (primary) hypertension: Secondary | ICD-10-CM | POA: Diagnosis not present

## 2019-01-28 DIAGNOSIS — E782 Mixed hyperlipidemia: Secondary | ICD-10-CM

## 2019-01-28 DIAGNOSIS — I5032 Chronic diastolic (congestive) heart failure: Secondary | ICD-10-CM | POA: Diagnosis not present

## 2019-01-28 DIAGNOSIS — Z79899 Other long term (current) drug therapy: Secondary | ICD-10-CM

## 2019-01-28 DIAGNOSIS — I70263 Atherosclerosis of native arteries of extremities with gangrene, bilateral legs: Secondary | ICD-10-CM | POA: Diagnosis not present

## 2019-01-28 DIAGNOSIS — L97529 Non-pressure chronic ulcer of other part of left foot with unspecified severity: Secondary | ICD-10-CM | POA: Diagnosis not present

## 2019-01-28 NOTE — Progress Notes (Signed)
MRN : [id number]  [name] is a 78 y.o. (07-12-1941) female who presents with chief complaint of  Chief Complaint  Patient presents with  . New Patient (Initial Visit)    ref Adamo for PVD and gangrene left toe  .  History of Present Illness:   The patient is seen for evaluation of painful lower extremities and diminished pulses associated with gangrene of the left 3rd toe.  The patient's husband notes the ulcer has been present for several months and has not been improving.  It is very painful but doesn't have any drainage.  No specific history of trauma noted.  The patient's husband denies fever or chills.  the patient does have diabetes.  She is s/p right below knee popliteal to peroneal bypass with reversed saphenous vein by Dr Bridgett Larsson on 02/28/2016.   This was for gangrene at the base of the right great toe.   There is increasing rest pain but no dangling of an extremity off the side of the bed during the night for relief.  No history of back problems or DJD of the lumbar sacral spine.   The patient denies amaurosis fugax or recent TIA symptoms. There are no recent neurological changes noted. The patient denies history of DVT, PE or superficial thrombophlebitis. The patient denies recent episodes of angina or shortness of breath.   Current Meds  Medication Sig  . acetaminophen (TYLENOL) 500 MG tablet Take 1,000 mg by mouth every 6 (six) hours as needed for mild pain.  [name] albuterol (PROVENTIL HFA;VENTOLIN HFA) 108 (90 Base) MCG/ACT inhaler Inhale 2 puffs into the lungs every 6 (six) hours as needed for wheezing or shortness of breath.   [name] aspirin 325 MG tablet Take 1 tablet (325 mg total) by mouth daily with breakfast. (Patient taking differently: Take 325 mg by mouth daily. )  . atorvastatin (LIPITOR) 20 MG tablet Take 1 tablet (20 mg total) by mouth daily.  . budesonide (PULMICORT) 0.25 MG/2ML nebulizer solution Take 2 mLs (0.25 mg total) by nebulization 2 (two) times daily.  .  carvedilol (COREG) 12.5 MG tablet Take 0.5 tablets (6.25 mg total) by mouth 2 (two) times daily.  . cetirizine (ZYRTEC) 10 MG tablet Take 10 mg by mouth daily.   . cholecalciferol (VITAMIN D) 1000 units tablet Take 2,000 Units by mouth daily.   . clopidogrel (PLAVIX) 75 MG tablet Take 1 tablet (75 mg total) by mouth daily with breakfast.  . docusate sodium (COLACE) 100 MG capsule Take 100 mg by mouth 2 (two) times daily.  . fluticasone (FLONASE) 50 MCG/ACT nasal spray Place 1 spray into both nostrils every evening.   . gabapentin (NEURONTIN) 100 MG capsule   . insulin glargine (LANTUS) 100 UNIT/ML injection Inject 0.15 mLs (15 Units total) into the skin at bedtime.  [name] ipratropium-albuterol (DUONEB) 0.5-2.5 (3) MG/3ML SOLN Take 3 mLs by nebulization 2 (two) times daily.  . isosorbide dinitrate (ISORDIL) 30 MG tablet Take 30 mg by mouth daily.   . magnesium oxide (MAG-OX) 400 MG tablet Take 400 mg by mouth daily.  . modafinil (PROVIGIL) 100 MG tablet Take 2 tablets (200 mg total) by mouth daily.  . nitroGLYCERIN (NITROSTAT) 0.4 MG SL tablet Place 1 tablet (0.4 mg total) under the tongue every 5 (five) minutes as needed for chest pain.  [name] omeprazole (PRILOSEC) 20 MG capsule Take 1 capsule (20 mg total) by mouth daily.  . polyethylene glycol (MIRALAX / GLYCOLAX) packet Take 17 g by mouth  2 (two) times daily.  . potassium chloride SA (K-DUR,KLOR-CON) 20 MEQ tablet Take 20 mEq by mouth daily.  [name] torsemide (DEMADEX) 20 MG tablet Take 1 tablet (20 mg total) by mouth 2 (two) times daily.    Past Medical History:  Diagnosis Date  . Acute heart failure ([place])   . Aortic stenosis   . CHF (congestive heart failure) ([place])   . Complication of anesthesia    Hard to wake patient up after having anesthesia  . Diabetes mellitus without complication ([place])   . Diabetic neuropathy (HCC)    Takes Lyrica  . Edema of both legs    Takes Lasix  . GERD (gastroesophageal reflux disease)   . High cholesterol   .  HTN (hypertension)   . Hypertension   . PAD (peripheral artery disease) ([place])   . Shortness of breath dyspnea   . Spasm of back muscles   . Stroke ([place])   . Wound, open    Right great toe    Past Surgical History:  Procedure Laterality Date  . BYPASS GRAFT POPLITEAL TO TIBIAL Right 02/28/2016   Procedure: BYPASS GRAFT RIGHT BELOW KNEE POPLITEAL TO PERONEAL USING REVERSED RIGHT GREATER SAPHENOUS VEIN;  Surgeon: Conrad Woodruff, MD;  Location: [place];  Service: Vascular;  Laterality: Right;  . CYST EXCISION     Abdomen  . EYE SURGERY Bilateral    Cataract removal  . INTRAMEDULLARY (IM) NAIL INTERTROCHANTERIC Left 02/04/2014   Procedure: INTRAMEDULLARY (IM) NAIL INTERTROCHANTRIC FEMORAL;  Surgeon: Mauri Pole, MD;  Location: [place];  Service: Orthopedics;  Laterality: Left;  . IR GENERIC HISTORICAL  03/01/2016   IR ANGIO INTRA EXTRACRAN SEL COM CAROTID INNOMINATE UNI R MOD SED 03/01/2016 [name], MD [facility]  . IR GENERIC HISTORICAL  03/01/2016   IR ENDOVASC INTRACRANIAL INF OTHER THAN THROMBO ART INC DIAG ANGIO 03/01/2016 [name], MD [facility]  . IR GENERIC HISTORICAL  03/01/2016   IR INTRAVSC STENT CERV CAROTID W/O EMB-PROT MOD SED INC ANGIO 03/01/2016 [name], MD [facility]  . IR GENERIC HISTORICAL  06/03/2016   IR RADIOLOGIST EVAL & MGMT 06/03/2016 [facility]  . ORIF TOE FRACTURE Right 02/08/2014   Procedure: OPEN REDUCTION INTERNAL FIXATION Right METATARSAL  FRACTURE ;  Surgeon: Wylene Simmer, MD;  Location: [facility];  Service: Orthopedics;  Laterality: Right;  . PERIPHERAL VASCULAR CATHETERIZATION N/A 12/28/2015   Procedure: Abdominal Aortogram w/Lower Extremity;  Surgeon: Conrad Shrewsbury, MD;  Location: [facility] [facility];  Service: Cardiovascular;  Laterality: N/A;  . RADIOLOGY WITH ANESTHESIA N/A 03/01/2016   Procedure: RADIOLOGY WITH ANESTHESIA;  Surgeon: [name], MD;  Location: [place];  Service: Radiology;  Laterality: N/A;  . VEIN HARVEST  Right 02/28/2016   Procedure: RIGHT GREATER SAPHENOUS VEIN HARVEST;  Surgeon: Conrad Diehlstadt, MD;  Location: [place];  Service: Vascular;  Laterality: Right;    Social History Social History   Tobacco Use  . Smoking status: Never Smoker  . Smokeless tobacco: Never Used  . Tobacco comment: Never smoked  Substance Use Topics  . Alcohol use: No    Alcohol/week: 0.0 standard drinks  . Drug use: No    Family History Family History  Problem Relation Age of Onset  . Diabetes Other   . Liver disease Mother   . CVA Father   . Diabetes Father   . Hypertension Father   No family history of bleeding/clotting disorders, porphyria or autoimmune disease   Allergies  Allergen  Reactions  . Iodine Anaphylaxis  . Penicillins Anaphylaxis    Tolerates ceftriaxone, cefazolin Has patient had a PCN reaction causing immediate rash, facial/tongue/throat swelling, SOB or lightheadedness with hypotension: Unknown Has patient had a PCN reaction causing severe rash involving mucus membranes or skin necrosis: Unknown Has patient had a PCN reaction that required hospitalization: Unknown Has patient had a PCN reaction occurring within the last 10 years: Unknown If all of the above answers are "NO", then may proceed with Cephalosporin u  . Shellfish Allergy Anaphylaxis  . Shellfish-Derived Products Anaphylaxis  . Sulfa Antibiotics Anaphylaxis  . Sulfacetamide Sodium Anaphylaxis  . Sulfasalazine Anaphylaxis  . Eggs Or Egg-Derived Products     Other reaction(s): SHORTNESS OF BREATH  . Morphine And Related Other (See Comments)    Altered mental status     REVIEW OF SYSTEMS (Negative unless checked)  Constitutional: [] Weight loss  [] Fever  [] Chills Cardiac: [] Chest pain   [] Chest pressure   [] Palpitations   [] Shortness of breath when laying flat   [] Shortness of breath with exertion. Vascular:  [] Pain in legs with walking   [x] Pain in legs at rest  [] History of DVT   [] Phlebitis   [] Swelling in legs    [] Varicose veins   [x] Non-healing ulcers Pulmonary:   [] Uses home oxygen   [] Productive cough   [] Hemoptysis   [] Wheeze  [] COPD   [] Asthma Neurologic:  [] Dizziness   [] Seizures   [x] History of stroke   [] History of TIA  [] Aphasia   [] Vissual changes   [x] Weakness or numbness in arm   [x] Weakness or numbness in leg Musculoskeletal:   [] Joint swelling   [] Joint pain   [] Low back pain Hematologic:  [] Easy bruising  [] Easy bleeding   [] Hypercoagulable state   [] Anemic Gastrointestinal:  [] Diarrhea   [] Vomiting  [] Gastroesophageal reflux/heartburn   [] Difficulty swallowing. Genitourinary:  [] Chronic kidney disease   [] Difficult urination  [] Frequent urination   [] Blood in urine Skin:  [] Rashes   [x] Ulcers  Psychological:  [] History of anxiety   []  History of major depression.  Physical Examination  Vitals:   01/28/19 0952  BP: 126/88  Pulse: 81  Resp: 17   There is no height or weight on file to calculate BMI. Gen: WD/WN, frail seen on a gurney moderate distress Head: [place]/AT, No temporalis wasting.  Ear/Nose/Throat: Hearing grossly intact, nares w/o erythema or drainage, poor dentition Eyes: PER, EOMI, sclera nonicteric.  Neck: Supple, no masses.  No bruit or JVD.  Pulmonary:  Good air movement, clear to auscultation bilaterally, no use of accessory muscles.  Cardiac: RRR, normal S1, S2, no Murmurs. Vascular: gangrene of the left third toe, dry Vessel Right Left  Radial Palpable Palpable  PT Not Palpable Not Palpable  DP Not Palpable Not Palpable  Gastrointestinal: soft, non-distended. No guarding/no peritoneal signs.  Musculoskeletal: M/S 5/5 throughout.  No deformity or atrophy.  Neurologic: CN 2-12 intact. Pain and light touch intact in extremities.  Symmetrical.  Speech is fluent. Motor exam as listed above. Psychiatric: Judgment intact, Mood & affect appropriate for pt's clinical situation. Dermatologic: + ulcers noted.  No changes consistent with cellulitis. Lymph : No Cervical  lymphadenopathy, no lichenification or skin changes of chronic lymphedema.  CBC Lab Results  Component Value Date   WBC 11.8 (H) 12/26/2018   HGB 13.3 12/26/2018   HCT 40.5 12/26/2018   MCV 80.4 12/26/2018   PLT 337 12/26/2018    BMET    Component Value Date/Time   NA 135 12/28/2018 0536  K 3.0 (L) 12/28/2018 0536   CL 96 (L) 12/28/2018 0536   CO2 29 12/28/2018 0536   GLUCOSE 109 (H) 12/28/2018 0536   BUN 12 12/28/2018 0536   CREATININE 0.49 12/28/2018 0536   CALCIUM 8.6 (L) 12/28/2018 0536   GFRNONAA >60 12/28/2018 0536   GFRAA >60 12/28/2018 0536   CrCl cannot be calculated (Patient's most recent lab result is older than the maximum 21 days allowed.).  COAG Lab Results  Component Value Date   INR 1.2 12/23/2018   INR 1.07 09/16/2016   INR 1.20 02/26/2016    Radiology No results found.    Assessment/Plan 1. 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  This represents a limb threatening ischemia and places the patient at the risk for left limb loss.  Patient should undergo angiography of the lower extremities with the hope for intervention for left limb salvage.  The risks and benefits as well as the alternative therapies was discussed in detail with the patient.  All questions were answered.  Patient's husband and her daughter agree to proceed with left leg angiography.  The patient will follow up with me in the office after the procedure.    A total of 70 minutes was spent with this patient and greater than 50% was spent in counseling and coordination of care with the patient.  This included a phone call to Ms Nancie Neas of about 20 minutes after the patient interview.  Discussion included the treatment options for vascular disease including indications for surgery and  intervention.  Also discussed is the appropriate timing of treatment.  In addition medical therapy was discussed.  2. Essential hypertension Continue antihypertensive medications as already ordered, these medications have been reviewed and there are no changes at this time.   3. Chronic diastolic congestive heart failure (HCC) Continue cardiac and antihypertensive medications as already ordered and reviewed, no changes at this time.  Continue statin as ordered and reviewed, no changes at this time  Nitrates PRN for chest pain   4. Mixed hyperlipidemia Continue statin as ordered and reviewed, no changes at this time    Hortencia Pilar, MD  01/28/2019 12:37 PM

## 2019-02-07 ENCOUNTER — Other Ambulatory Visit (INDEPENDENT_AMBULATORY_CARE_PROVIDER_SITE_OTHER): Payer: Self-pay | Admitting: Nurse Practitioner

## 2019-02-08 ENCOUNTER — Telehealth (INDEPENDENT_AMBULATORY_CARE_PROVIDER_SITE_OTHER): Payer: Self-pay

## 2019-02-08 DIAGNOSIS — E782 Mixed hyperlipidemia: Secondary | ICD-10-CM | POA: Diagnosis not present

## 2019-02-08 DIAGNOSIS — I5032 Chronic diastolic (congestive) heart failure: Secondary | ICD-10-CM | POA: Diagnosis not present

## 2019-02-08 DIAGNOSIS — Z8673 Personal history of transient ischemic attack (TIA), and cerebral infarction without residual deficits: Secondary | ICD-10-CM | POA: Diagnosis not present

## 2019-02-08 DIAGNOSIS — Z888 Allergy status to other drugs, medicaments and biological substances status: Secondary | ICD-10-CM | POA: Diagnosis not present

## 2019-02-08 DIAGNOSIS — Z882 Allergy status to sulfonamides status: Secondary | ICD-10-CM | POA: Diagnosis not present

## 2019-02-08 DIAGNOSIS — E1152 Type 2 diabetes mellitus with diabetic peripheral angiopathy with gangrene: Secondary | ICD-10-CM | POA: Diagnosis not present

## 2019-02-08 DIAGNOSIS — Z833 Family history of diabetes mellitus: Secondary | ICD-10-CM | POA: Diagnosis not present

## 2019-02-08 DIAGNOSIS — Z95820 Peripheral vascular angioplasty status with implants and grafts: Secondary | ICD-10-CM | POA: Diagnosis not present

## 2019-02-08 DIAGNOSIS — I110 Hypertensive heart disease with heart failure: Secondary | ICD-10-CM | POA: Diagnosis not present

## 2019-02-08 DIAGNOSIS — L97529 Non-pressure chronic ulcer of other part of left foot with unspecified severity: Secondary | ICD-10-CM | POA: Diagnosis not present

## 2019-02-08 DIAGNOSIS — I70262 Atherosclerosis of native arteries of extremities with gangrene, left leg: Secondary | ICD-10-CM | POA: Diagnosis not present

## 2019-02-08 DIAGNOSIS — K219 Gastro-esophageal reflux disease without esophagitis: Secondary | ICD-10-CM | POA: Diagnosis not present

## 2019-02-08 DIAGNOSIS — E1122 Type 2 diabetes mellitus with diabetic chronic kidney disease: Secondary | ICD-10-CM | POA: Diagnosis not present

## 2019-02-08 DIAGNOSIS — E11621 Type 2 diabetes mellitus with foot ulcer: Secondary | ICD-10-CM | POA: Diagnosis not present

## 2019-02-08 DIAGNOSIS — Z880 Allergy status to penicillin: Secondary | ICD-10-CM | POA: Diagnosis not present

## 2019-02-08 DIAGNOSIS — Z1159 Encounter for screening for other viral diseases: Secondary | ICD-10-CM | POA: Diagnosis not present

## 2019-02-08 DIAGNOSIS — E1142 Type 2 diabetes mellitus with diabetic polyneuropathy: Secondary | ICD-10-CM | POA: Diagnosis not present

## 2019-02-08 DIAGNOSIS — Z885 Allergy status to narcotic agent status: Secondary | ICD-10-CM | POA: Diagnosis not present

## 2019-02-08 DIAGNOSIS — Z8249 Family history of ischemic heart disease and other diseases of the circulatory system: Secondary | ICD-10-CM | POA: Diagnosis not present

## 2019-02-08 MED ORDER — CLINDAMYCIN PHOSPHATE 300 MG/50ML IV SOLN
300.0000 mg | Freq: Once | INTRAVENOUS | Status: AC
  Administered 2019-02-09: 300 mg via INTRAVENOUS
  Filled 2019-02-08: qty 50

## 2019-02-08 NOTE — Telephone Encounter (Signed)
Patient's husband called wanting to know exactly where to go for the Covid-19 test. I explained that she would need to go to the [facility] and then the [facility] for her procedure. Patient's husband wanted to know if someone could go in with her because she is on a stretcher and being brought there by EMS transport. I advised the patient to ask someone at the [facility] tomorrow morning.

## 2019-02-09 ENCOUNTER — Ambulatory Visit
Admission: RE | Admit: 2019-02-09 | Discharge: 2019-02-09 | Disposition: A | Discharge status: Home / Self Care | Payer: Medicare HMO | Attending: Vascular Surgery | Admitting: Vascular Surgery

## 2019-02-09 ENCOUNTER — Encounter
Admission: RE | Disposition: A | Discharge status: Home / Self Care | Payer: Self-pay | Source: Home / Self Care | Attending: Vascular Surgery

## 2019-02-09 ENCOUNTER — Other Ambulatory Visit: Payer: Self-pay

## 2019-02-09 ENCOUNTER — Other Ambulatory Visit
Admission: RE | Admit: 2019-02-09 | Discharge: 2019-02-09 | Disposition: A | Discharge status: Home / Self Care | Payer: Medicare HMO | Source: Ambulatory Visit | Attending: Vascular Surgery | Admitting: Vascular Surgery

## 2019-02-09 DIAGNOSIS — E11621 Type 2 diabetes mellitus with foot ulcer: Secondary | ICD-10-CM | POA: Insufficient documentation

## 2019-02-09 DIAGNOSIS — I70262 Atherosclerosis of native arteries of extremities with gangrene, left leg: Secondary | ICD-10-CM | POA: Diagnosis not present

## 2019-02-09 DIAGNOSIS — K219 Gastro-esophageal reflux disease without esophagitis: Secondary | ICD-10-CM | POA: Insufficient documentation

## 2019-02-09 DIAGNOSIS — Z1159 Encounter for screening for other viral diseases: Secondary | ICD-10-CM | POA: Insufficient documentation

## 2019-02-09 DIAGNOSIS — E1122 Type 2 diabetes mellitus with diabetic chronic kidney disease: Secondary | ICD-10-CM | POA: Insufficient documentation

## 2019-02-09 DIAGNOSIS — E1152 Type 2 diabetes mellitus with diabetic peripheral angiopathy with gangrene: Secondary | ICD-10-CM | POA: Insufficient documentation

## 2019-02-09 DIAGNOSIS — Z888 Allergy status to other drugs, medicaments and biological substances status: Secondary | ICD-10-CM | POA: Insufficient documentation

## 2019-02-09 DIAGNOSIS — E782 Mixed hyperlipidemia: Secondary | ICD-10-CM | POA: Insufficient documentation

## 2019-02-09 DIAGNOSIS — Z8249 Family history of ischemic heart disease and other diseases of the circulatory system: Secondary | ICD-10-CM | POA: Insufficient documentation

## 2019-02-09 DIAGNOSIS — L97529 Non-pressure chronic ulcer of other part of left foot with unspecified severity: Secondary | ICD-10-CM | POA: Insufficient documentation

## 2019-02-09 DIAGNOSIS — Z833 Family history of diabetes mellitus: Secondary | ICD-10-CM | POA: Insufficient documentation

## 2019-02-09 DIAGNOSIS — Z885 Allergy status to narcotic agent status: Secondary | ICD-10-CM | POA: Insufficient documentation

## 2019-02-09 DIAGNOSIS — I70269 Atherosclerosis of native arteries of extremities with gangrene, unspecified extremity: Secondary | ICD-10-CM

## 2019-02-09 DIAGNOSIS — Z882 Allergy status to sulfonamides status: Secondary | ICD-10-CM | POA: Insufficient documentation

## 2019-02-09 DIAGNOSIS — I110 Hypertensive heart disease with heart failure: Secondary | ICD-10-CM | POA: Insufficient documentation

## 2019-02-09 DIAGNOSIS — I5032 Chronic diastolic (congestive) heart failure: Secondary | ICD-10-CM | POA: Insufficient documentation

## 2019-02-09 DIAGNOSIS — E1142 Type 2 diabetes mellitus with diabetic polyneuropathy: Secondary | ICD-10-CM | POA: Insufficient documentation

## 2019-02-09 DIAGNOSIS — Z95820 Peripheral vascular angioplasty status with implants and grafts: Secondary | ICD-10-CM | POA: Insufficient documentation

## 2019-02-09 DIAGNOSIS — Z8673 Personal history of transient ischemic attack (TIA), and cerebral infarction without residual deficits: Secondary | ICD-10-CM | POA: Insufficient documentation

## 2019-02-09 DIAGNOSIS — Z880 Allergy status to penicillin: Secondary | ICD-10-CM | POA: Insufficient documentation

## 2019-02-09 HISTORY — PX: LOWER EXTREMITY ANGIOGRAPHY: CATH118251

## 2019-02-09 LAB — BUN: BUN: 13 mg/dL (ref 8–23)

## 2019-02-09 LAB — CREATININE, SERUM
Creatinine, Ser: 0.53 mg/dL (ref 0.44–1.00)
GFR calc Af Amer: >60 mL/min (ref >60)
GFR calc non Af Amer: >60 mL/min (ref >60)

## 2019-02-09 LAB — SARS CORONAVIRUS 2 BY RT PCR (HOSPITAL ORDER, PERFORMED IN ~~LOC~~ HOSPITAL LAB): SARS Coronavirus 2: NEGATIVE

## 2019-02-09 LAB — GLUCOSE, CAPILLARY
Glucose-Capillary: 208 mg/dL — ABNORMAL HIGH (ref 70–99)
Glucose-Capillary: 211 mg/dL — ABNORMAL HIGH (ref 70–99)
Glucose-Capillary: 264 mg/dL — ABNORMAL HIGH (ref 70–99)

## 2019-02-09 SURGERY — LOWER EXTREMITY ANGIOGRAPHY
Anesthesia: Moderate Sedation | Site: Leg Lower | Laterality: Left

## 2019-02-09 MED ORDER — SODIUM CHLORIDE 0.9 % IV SOLN
INTRAVENOUS | Status: DC
  Administered 2019-02-09: 1000 mL via INTRAVENOUS

## 2019-02-09 MED ORDER — HEPARIN SODIUM (PORCINE) 1000 UNIT/ML IJ SOLN
INTRAMUSCULAR | Status: AC
  Filled 2019-02-09: qty 1

## 2019-02-09 MED ORDER — LABETALOL HCL 5 MG/ML IV SOLN: 10.0000 mg | INTRAVENOUS | Status: DC | PRN

## 2019-02-09 MED ORDER — FAMOTIDINE 20 MG PO TABS
40.0000 mg | ORAL_TABLET | Freq: Once | ORAL | Status: AC | PRN
  Administered 2019-02-09: 40 mg via ORAL

## 2019-02-09 MED ORDER — DIPHENHYDRAMINE HCL 50 MG/ML IJ SOLN
INTRAMUSCULAR | Status: AC
  Administered 2019-02-09: 25 mg via INTRAVENOUS
  Filled 2019-02-09: qty 1

## 2019-02-09 MED ORDER — MIDAZOLAM HCL 2 MG/ML PO SYRP
ORAL_SOLUTION | ORAL | Status: AC
  Filled 2019-02-09: qty 4

## 2019-02-09 MED ORDER — HEPARIN SODIUM (PORCINE) 1000 UNIT/ML IJ SOLN
INTRAMUSCULAR | Status: DC | PRN
  Administered 2019-02-09: 5000 [IU] via INTRAVENOUS

## 2019-02-09 MED ORDER — HYDRALAZINE HCL 20 MG/ML IJ SOLN: 5.0000 mg | INTRAMUSCULAR | Status: DC | PRN

## 2019-02-09 MED ORDER — FENTANYL CITRATE (PF) 100 MCG/2ML IJ SOLN
INTRAMUSCULAR | Status: DC | PRN
  Administered 2019-02-09 (×3): 25 ug via INTRAVENOUS

## 2019-02-09 MED ORDER — DIPHENHYDRAMINE HCL 50 MG/ML IJ SOLN
50.0000 mg | Freq: Once | INTRAMUSCULAR | Status: AC | PRN
  Administered 2019-02-09: 10:00:00 25 mg via INTRAVENOUS

## 2019-02-09 MED ORDER — CLINDAMYCIN PHOSPHATE 300 MG/50ML IV SOLN
INTRAVENOUS | Status: AC
  Filled 2019-02-09: qty 50

## 2019-02-09 MED ORDER — FENTANYL CITRATE (PF) 100 MCG/2ML IJ SOLN: 12.5000 ug | Freq: Once | INTRAMUSCULAR | Status: DC | PRN

## 2019-02-09 MED ORDER — MORPHINE SULFATE (PF) 4 MG/ML IV SOLN: 2.0000 mg | INTRAVENOUS | Status: DC | PRN

## 2019-02-09 MED ORDER — MIDAZOLAM HCL 2 MG/ML PO SYRP
8.0000 mg | ORAL_SOLUTION | Freq: Once | ORAL | Status: AC | PRN
  Administered 2019-02-09: 4 mg via ORAL

## 2019-02-09 MED ORDER — FENTANYL CITRATE (PF) 100 MCG/2ML IJ SOLN
INTRAMUSCULAR | Status: AC
  Filled 2019-02-09: qty 2

## 2019-02-09 MED ORDER — LIDOCAINE HCL (PF) 1 % IJ SOLN
INTRAMUSCULAR | Status: AC
  Filled 2019-02-09: qty 30

## 2019-02-09 MED ORDER — SODIUM CHLORIDE 0.9% FLUSH: 3.0000 mL | INTRAVENOUS | Status: DC | PRN

## 2019-02-09 MED ORDER — METHYLPREDNISOLONE SODIUM SUCC 125 MG IJ SOLR
125.0000 mg | Freq: Once | INTRAMUSCULAR | Status: AC | PRN
  Administered 2019-02-09: 10:00:00 125 mg via INTRAVENOUS

## 2019-02-09 MED ORDER — ACETAMINOPHEN 325 MG PO TABS: 650.0000 mg | ORAL_TABLET | ORAL | Status: DC | PRN

## 2019-02-09 MED ORDER — SODIUM CHLORIDE 0.9% FLUSH: 3.0000 mL | Freq: Two times a day (BID) | INTRAVENOUS | Status: DC

## 2019-02-09 MED ORDER — SODIUM CHLORIDE 0.9 % IV SOLN: INTRAVENOUS | Status: AC

## 2019-02-09 MED ORDER — METHYLPREDNISOLONE SODIUM SUCC 125 MG IJ SOLR
INTRAMUSCULAR | Status: AC
  Administered 2019-02-09: 125 mg via INTRAVENOUS
  Filled 2019-02-09: qty 2

## 2019-02-09 MED ORDER — ONDANSETRON HCL 4 MG/2ML IJ SOLN: 4.0000 mg | Freq: Four times a day (QID) | INTRAMUSCULAR | Status: DC | PRN

## 2019-02-09 MED ORDER — OXYCODONE HCL 5 MG PO TABS: 5.0000 mg | ORAL_TABLET | ORAL | Status: DC | PRN

## 2019-02-09 MED ORDER — MIDAZOLAM HCL 2 MG/2ML IJ SOLN
INTRAMUSCULAR | Status: DC | PRN
  Administered 2019-02-09: 1 mg via INTRAVENOUS
  Administered 2019-02-09: 0.5 mg via INTRAVENOUS

## 2019-02-09 MED ORDER — FAMOTIDINE 20 MG PO TABS
ORAL_TABLET | ORAL | Status: AC
  Administered 2019-02-09: 40 mg via ORAL
  Filled 2019-02-09: qty 2

## 2019-02-09 MED ORDER — IODIXANOL 320 MG/ML IV SOLN
INTRAVENOUS | Status: DC | PRN
  Administered 2019-02-09: 130 mL via INTRA_ARTERIAL

## 2019-02-09 MED ORDER — SODIUM CHLORIDE 0.9 % IV SOLN: 250.0000 mL | INTRAVENOUS | Status: DC | PRN

## 2019-02-09 MED ORDER — MIDAZOLAM HCL 5 MG/5ML IJ SOLN
INTRAMUSCULAR | Status: AC
  Filled 2019-02-09: qty 5

## 2019-02-09 SURGICAL SUPPLY — 35 items
BALLN COYOTE OTW 2X100X150 (BALLOONS) ×3
BALLN LUTONIX 018 4X100X130 (BALLOONS) ×3
BALLN LUTONIX DCB 4X100X130 (BALLOONS) ×3
BALLN ULTRASCOR 014 2.5X40X150 (BALLOONS) ×3
BALLN ULTRASCORE 014 2X100X150 (BALLOONS) ×3
BALLN ULTRASCORE 014 3X200X150 (BALLOONS) ×3
BALLN ULTRVRSE 1.5X40X150 (BALLOONS) ×3
BALLOON COYOTE OTW 2X100X150 (BALLOONS) IMPLANT
BALLOON LUTONIX 018 4X100X130 (BALLOONS) IMPLANT
BALLOON LUTONIX DCB 4X100X130 (BALLOONS) IMPLANT
BALLOON ULTRSCR 014 2.5X40X150 (BALLOONS) IMPLANT
BALLOON ULTRSCRE 014 2X100X150 (BALLOONS) IMPLANT
BALLOON ULTRSCRE 014 3X200X150 (BALLOONS) IMPLANT
BALLOON ULTRVRSE 1.5X40X150 (BALLOONS) IMPLANT
CATH CROSSER 14S OTW 146CM (CATHETERS) ×3 IMPLANT
CATH PIG 70CM (CATHETERS) ×3 IMPLANT
CATH SIDEKICK ANG TAPER 110 (SHEATH) ×3 IMPLANT
CATH STS 5FR 125CM (CATHETERS) ×3 IMPLANT
CATH VERT 5FR 125CM (CATHETERS) ×2 IMPLANT
DEVICE PRESTO INFLATION (MISCELLANEOUS) ×2 IMPLANT
DEVICE STARCLOSE SE CLOSURE (Vascular Products) ×2 IMPLANT
DEVICE TORQUE .025-.038 (MISCELLANEOUS) ×3 IMPLANT
GLIDEWIRE ADV .035X260CM (WIRE) ×3 IMPLANT
KIT FLOWMATE PROCEDURAL (KITS) ×3 IMPLANT
NDL ENTRY 21GA 7CM ECHOTIP (NEEDLE) ×1 IMPLANT
NEEDLE ENTRY 21GA 7CM ECHOTIP (NEEDLE) ×3 IMPLANT
PACK ANGIOGRAPHY (CUSTOM PROCEDURE TRAY) ×3 IMPLANT
SET INTRO CAPELLA COAXIAL (SET/KITS/TRAYS/PACK) ×3 IMPLANT
SHEATH BRITE TIP 5FRX11 (SHEATH) ×6 IMPLANT
SHEATH RAABE 7FR (SHEATH) ×3 IMPLANT
SYR MEDRAD MARK 7 150ML (SYRINGE) ×2 IMPLANT
TUBING CONTRAST HIGH PRESS 72 (TUBING) ×3 IMPLANT
VALVE HEMO TOUHY BORST Y (ADAPTER) ×2 IMPLANT
WIRE J 3MM .035X145CM (WIRE) ×3 IMPLANT
WIRE RUNTHROUGH .014X300CM (WIRE) ×2 IMPLANT

## 2019-02-09 NOTE — H&P (Signed)
[facility] History & Physical Update  The patient was interviewed and re-examined.  The patient's previous History and Physical has been reviewed and is unchanged.  There is no change in the plan of care. We plan to proceed with the scheduled procedure.  Hortencia Pilar, MD  02/09/2019, 11:03 AM

## 2019-02-09 NOTE — Op Note (Signed)
[facility] Percutaneous Study/Intervention Procedural Note   Date of Surgery: 02/09/2019  Surgeon:  Katha Cabal, MD.  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 Introduction catheter into left lower extremity 3rd order catheter placement  2. Contrast injection left lower extremity for distal runoff   3. Crosser atherectomy of the left popliteal artery 4.  Percutaneous transluminal angioplasty left popliteal artery to 4 mm with a Lutonix drug-eluting balloon             5.  Crosser atherectomy of the left anterior tibial artery                         6.     Percutaneous transluminal angioplasty of the left anterior tibial artery to 3 mm and the left dorsalis pedis artery to 1.5 mm                        7.   Star close closure right common femoral arteriotomy  Anesthesia: Conscious sedation was administered under my direct supervision by the interventional radiology RN. IV Versed plus fentanyl were utilized. Continuous ECG, pulse oximetry and blood pressure was monitored throughout the entire procedure. Conscious sedation was for a total of 135 minutes.  Sheath: 7 [place] Raby right common femoral retrograde  Contrast: 130 cc  Fluoroscopy Time: 26.5 minutes  Indications: [name] presents with gangrenous changes to multiple toes left foot.  The risks and benefits are reviewed all questions answered patient agrees to proceed.  Procedure: [name] is a 78 y.o. y.o. female who was identified and appropriate procedural time out was performed. The patient was then placed supine on the table and prepped and draped in the usual sterile fashion.   Ultrasound was placed in the sterile sleeve and the right groin was evaluated the right common femoral artery was echolucent  and pulsatile indicating patency.  Image was recorded for the permanent record and under real-time visualization a microneedle was inserted into the common femoral artery microwire followed by a micro-sheath.  A J-wire was then advanced through the micro-sheath and a  5 [place] sheath was then inserted over a J-wire. J-wire was then advanced and a 5 French pigtail catheter was positioned at the level of T12. AP projection of the aorta was then obtained. Pigtail catheter was repositioned to above the bifurcation and a RAO view of the pelvis was obtained.  Subsequently a pigtail catheter with the stiff angle Glidewire was used to cross the aortic bifurcation the catheter wire were advanced down into the left distal external iliac artery. Oblique view of the femoral bifurcation was then obtained and subsequently the wire was reintroduced and the pigtail catheter negotiated into the SFA representing third order catheter placement. Distal runoff was then performed.  Diagnostic interpretation: The abdominal aorta is opacified with a bolus injection of contrast.  There are no hemodynamically significant lesions.  There are minimal atherosclerotic changes noted.  Bilateral nephrograms are identified normal in size there are single renal arteries no evidence of hemodynamically significant renal artery stenosis.  The bilateral common and external iliac arteries are widely patent.  No evidence of hemodynamically significant stenosis identified.  Internal iliac arteries are patent bilaterally as well.  The left common femoral and profunda femoris arteries are widely patent.  The superficial femoral artery is widely patent with mild  atherosclerotic changes.  At [facility] canal the above-knee popliteal demonstrates a 70% smooth narrowing which tapers down to an occlusion of the popliteal at the level of the tibial plateau.  Trifurcation is largely occluded as well.  Distal runoff there is nonvisualization of the tibioperoneal  trunk posterior tibial throughout its entirety and the peroneal for its proximal and mid thirds.  Anterior tibial demonstrates occlusion at its origin and a short segment proximally it then occludes again.  The distal one third is patent however at the level of the ankle there is a subtotal occlusion greater than 90%.  Dorsalis pedis is largely occluded throughout its course.    Based on these images I elected to proceed with intervention for limb salvage.  5000 units of heparin was then given and allowed to circulate and a 7 [place] Raby sheath was advanced up and over the bifurcation and positioned in the femoral artery  The 14 S Crosser catheter was then prepped on the field and a angled side kick catheter was advanced into the cul-de-sac of the popliteal under magnified imaging. Using the Crosser catheter the occlusion of the popliteal was negotiated.   Injection of contrast through the side-port demonstrated intraluminal positioning.  Using the angled side kick the 14 S catheter was then negotiated into the anterior tibial.  The first occluded segment of the anterior tibial was then negotiated.  Again, hand-injection through the side-port demonstrated intraluminal positioning however I was not able to get the Crosser catheter to negotiate the turn and engage the long segment occlusion of the anterior tibial.  I therefore advanced an advantage wire through the side kick catheter and negotiated the wire down into the cul-de-sac of the anterior tibial.  This then allowed advancing the side kick catheter.  The 52 S crosser catheter was then reintroduced and using the Crosser I was able to successfully negotiate the long segment occlusion of the anterior tibial.  Hand-injection of contrast through the side-port of the 14 S catheter demonstrated the distal third of the anterior tibial was patent down to the ankle where the subtotal occlusion again was noted.  0.014 run-through wire was then able to be negotiated  down across the distal anterior tibial and actually out the foot with the tip of the wire positioned at the base of the first metatarsal.  A 3 mm x 20 cm ultra score balloon was then advanced across the anterior tibial extending up into the popliteal.  2 separate inflations were made each to 12 atm for 1 minute.  Next a 4 mm x 100 mm Lutonix balloon was used to angioplasty the below-knee popliteal and a second 4 x 100 mm Lutonix balloon was used to angioplasty the proximal popliteal up to Hunter's canal.  Both inflations were to 12 atm for 2 full minutes.  Follow-up imaging now demonstrated less than 10% residual stenosis throughout the entire popliteal artery.  The 3 mm inflation did an excellent job in the proximal SFA however in the mid SFA a greater than 50% residual stenosis was still noted and this was treated with a series of balloons beginning with a 2 mm Ultraverse but ultimately extending to a 2.5 mm x 40 mm ultra score.  Inflations were to 10 to 12 atm for 1 minute.  Follow-up imaging now demonstrated the anterior tibial was widely patent with less than 10% residual stenosis down to the lesion at the ankle.  I was able to get a 1.5 mm balloon across 3 serial inflations  were then performed beginning in the distal dorsalis pedis and extending proximally and then a 2 mm x 40 mm ultra score was used to treat the high-grade lesion.  The ultra score inflation was to 14 atm for 2 minutes.  Follow-up imaging now demonstrated rapid flow throughout the anterior tibial and all the way out the dorsalis pedis to the toes.  There is less than 10% residual stenosis throughout the entire tibial system.  I have also now identified 2 large collateral branches from the anterior tibial there filling the distal peroneal which collateralizes to the plantar arteries.  After review of these images the sheath is pulled into the right external iliac oblique of the common femoral is obtained and a Star close device deployed.  There no immediate Complications.  Findings:  The abdominal aorta is opacified with a bolus injection of contrast.  There are no hemodynamically significant lesions.  There are minimal atherosclerotic changes noted.  Bilateral nephrograms are identified normal in size there are single renal arteries no evidence of hemodynamically significant renal artery stenosis.  The bilateral common and external iliac arteries are widely patent.  No evidence of hemodynamically significant stenosis identified.  Internal iliac arteries are patent bilaterally as well.  The left common femoral and profunda femoris arteries are widely patent.  The superficial femoral artery is widely patent with mild atherosclerotic changes.  At [facility] canal the above-knee popliteal demonstrates a 70% smooth narrowing which tapers down to an occlusion of the popliteal at the level of the tibial plateau.  Trifurcation is largely occluded as well.  Distal runoff there is nonvisualization of the tibioperoneal trunk posterior tibial throughout its entirety and the peroneal for its proximal and mid thirds.  Anterior tibial demonstrates occlusion at its origin and a short segment proximally it then occludes again.  The distal one third is patent however at the level of the ankle there is a subtotal occlusion greater than 90%.  Dorsalis pedis is largely occluded throughout its course.    Crosser atherectomy of the popliteal as well as the anterior tibial is successful and intraluminal positioning is confirmed by hand-injection of contrast.  Angioplasty to 4 mm of the popliteal yields an excellent result no indication for stenting and there is less than 10% residual stenosis.  Angioplasty to 3 mm maximal diameter of the anterior tibial yields an excellent result with less than 10% residual stenosis throughout the entire anterior tibial.  Angioplasty of the dorsalis pedis to 1.5 mm yields an excellent result and there is now inline flow of  contrast down to the forefoot.   Disposition: Patient was taken to the recovery room in stable condition having tolerated the procedure well.  [name] 02/09/2019,1:51 PM

## 2019-02-10 ENCOUNTER — Encounter: Payer: Self-pay | Admitting: Vascular Surgery

## 2019-02-25 ENCOUNTER — Other Ambulatory Visit (INDEPENDENT_AMBULATORY_CARE_PROVIDER_SITE_OTHER): Payer: Self-pay | Admitting: Vascular Surgery

## 2019-02-25 DIAGNOSIS — Z9862 Peripheral vascular angioplasty status: Secondary | ICD-10-CM

## 2019-02-25 DIAGNOSIS — I70262 Atherosclerosis of native arteries of extremities with gangrene, left leg: Secondary | ICD-10-CM

## 2019-03-01 ENCOUNTER — Encounter (INDEPENDENT_AMBULATORY_CARE_PROVIDER_SITE_OTHER): Payer: Self-pay

## 2019-03-01 ENCOUNTER — Ambulatory Visit (INDEPENDENT_AMBULATORY_CARE_PROVIDER_SITE_OTHER): Payer: Medicare HMO

## 2019-03-01 ENCOUNTER — Telehealth (INDEPENDENT_AMBULATORY_CARE_PROVIDER_SITE_OTHER): Payer: Self-pay

## 2019-03-01 ENCOUNTER — Ambulatory Visit (INDEPENDENT_AMBULATORY_CARE_PROVIDER_SITE_OTHER): Payer: Medicare HMO | Admitting: Vascular Surgery

## 2019-03-01 ENCOUNTER — Other Ambulatory Visit: Payer: Self-pay

## 2019-03-01 ENCOUNTER — Encounter (INDEPENDENT_AMBULATORY_CARE_PROVIDER_SITE_OTHER): Payer: Self-pay | Admitting: Vascular Surgery

## 2019-03-01 VITALS — BP 142/76 | HR 80 | Resp 16

## 2019-03-01 DIAGNOSIS — E1159 Type 2 diabetes mellitus with other circulatory complications: Secondary | ICD-10-CM | POA: Diagnosis not present

## 2019-03-01 DIAGNOSIS — Z9862 Peripheral vascular angioplasty status: Secondary | ICD-10-CM | POA: Diagnosis not present

## 2019-03-01 DIAGNOSIS — I70262 Atherosclerosis of native arteries of extremities with gangrene, left leg: Secondary | ICD-10-CM | POA: Diagnosis not present

## 2019-03-01 DIAGNOSIS — Z79899 Other long term (current) drug therapy: Secondary | ICD-10-CM

## 2019-03-01 DIAGNOSIS — I5032 Chronic diastolic (congestive) heart failure: Secondary | ICD-10-CM

## 2019-03-01 DIAGNOSIS — I70263 Atherosclerosis of native arteries of extremities with gangrene, bilateral legs: Secondary | ICD-10-CM

## 2019-03-01 DIAGNOSIS — Z794 Long term (current) use of insulin: Secondary | ICD-10-CM

## 2019-03-01 DIAGNOSIS — E782 Mixed hyperlipidemia: Secondary | ICD-10-CM

## 2019-03-01 DIAGNOSIS — I10 Essential (primary) hypertension: Secondary | ICD-10-CM | POA: Diagnosis not present

## 2019-03-01 NOTE — Progress Notes (Signed)
MRN : [id number]  [name] is a 78 y.o. (25-Aug-1940) female who presents with chief complaint of  Chief Complaint  Patient presents with   Follow-up    [facility] 3week ABI  .  History of Present Illness:   The patient returns to the office for followup and review status post angiogram with intervention. The patient notes little if any improvement in the lower extremity wounds.  No new ulcers or wounds have occurred since the last visit.  There have been no significant changes to the patient's overall health care.  The patient denies amaurosis fugax or recent TIA symptoms. There are no recent neurological changes noted. The patient denies history of DVT, PE or superficial thrombophlebitis. The patient denies recent episodes of angina or shortness of breath.   ABI's Rt=0.63 and Lt=0.78  (previous ABI's Rt=0.84 and Lt=0.82)   Current Meds  Medication Sig   acetaminophen (TYLENOL) 500 MG tablet Take 1,000 mg by mouth every 6 (six) hours as needed for mild pain.   albuterol (PROVENTIL HFA;VENTOLIN HFA) 108 (90 Base) MCG/ACT inhaler Inhale 2 puffs into the lungs every 6 (six) hours as needed for wheezing or shortness of breath.    amLODipine (NORVASC) 10 MG tablet Take 10 mg by mouth daily.   aspirin 325 MG tablet Take 1 tablet (325 mg total) by mouth daily with breakfast. (Patient taking differently: Take 325 mg by mouth daily. )   atorvastatin (LIPITOR) 20 MG tablet Take 1 tablet (20 mg total) by mouth daily. (Patient taking differently: Take 20 mg by mouth at bedtime. )   budesonide (PULMICORT) 0.25 MG/2ML nebulizer solution Take 2 mLs (0.25 mg total) by nebulization 2 (two) times daily.   carvedilol (COREG) 6.25 MG tablet Take 6.25 mg by mouth 2 (two) times daily with a meal.   cetirizine (ZYRTEC) 10 MG tablet Take 10 mg by mouth daily.    Cholecalciferol (VITAMIN D) 50 MCG (2000 UT) CAPS Take 2,000 Units by mouth daily.    clopidogrel (PLAVIX) 75 MG tablet Take 1 tablet  (75 mg total) by mouth daily with breakfast.   docusate sodium (COLACE) 100 MG capsule Take 100 mg by mouth 2 (two) times daily.   fluticasone (FLONASE) 50 MCG/ACT nasal spray Place 1 spray into both nostrils daily as needed for allergies.    gabapentin (NEURONTIN) 300 MG capsule Take 300 mg by mouth every evening.    hydrALAZINE (APRESOLINE) 25 MG tablet Take 1 tablet (25 mg total) by mouth every 8 (eight) hours.   insulin glargine (LANTUS) 100 UNIT/ML injection Inject 0.15 mLs (15 Units total) into the skin at bedtime.   ipratropium-albuterol (DUONEB) 0.5-2.5 (3) MG/3ML SOLN Take 3 mLs by nebulization 2 (two) times daily.   magnesium oxide (MAG-OX) 400 MG tablet Take 400 mg by mouth 2 (two) times daily.    modafinil (PROVIGIL) 200 MG tablet Take 200 mg by mouth daily.   nitroGLYCERIN (NITROSTAT) 0.4 MG SL tablet Place 1 tablet (0.4 mg total) under the tongue every 5 (five) minutes as needed for chest pain.   omeprazole (PRILOSEC) 20 MG capsule Take 1 capsule (20 mg total) by mouth daily.   OXYGEN Inhale 2 L into the lungs daily as needed (short of breath).   polyethylene glycol (MIRALAX / GLYCOLAX) packet Take 17 g by mouth 2 (two) times daily. (Patient taking differently: Take 17 g by mouth daily as needed for moderate constipation. )   potassium chloride SA (K-DUR,KLOR-CON) 20 MEQ tablet Take 20  mEq by mouth daily.   torsemide (DEMADEX) 20 MG tablet Take 1 tablet (20 mg total) by mouth 2 (two) times daily.   Vitamin E 180 MG CAPS Take 180 mg by mouth daily.    Past Medical History:  Diagnosis Date   Acute heart failure (HCC)    Aortic stenosis    CHF (congestive heart failure) (HCC)    Complication of anesthesia    Hard to wake patient up after having anesthesia   Diabetes mellitus without complication ([place])    Diabetic neuropathy (HCC)    Takes Lyrica   Edema of both legs    Takes Lasix   GERD (gastroesophageal reflux disease)    High cholesterol    HTN  (hypertension)    Hypertension    PAD (peripheral artery disease) (HCC)    Shortness of breath dyspnea    Spasm of back muscles    Stroke (HCC)    Wound, open    Right great toe    Past Surgical History:  Procedure Laterality Date   BYPASS GRAFT POPLITEAL TO TIBIAL Right 02/28/2016   Procedure: BYPASS GRAFT RIGHT BELOW KNEE POPLITEAL TO PERONEAL USING REVERSED RIGHT GREATER SAPHENOUS VEIN;  Surgeon: Conrad Kent, MD;  Location: [place];  Service: Vascular;  Laterality: Right;   CYST EXCISION     Abdomen   EYE SURGERY Bilateral    Cataract removal   INTRAMEDULLARY (IM) NAIL INTERTROCHANTERIC Left 02/04/2014   Procedure: INTRAMEDULLARY (IM) NAIL INTERTROCHANTRIC FEMORAL;  Surgeon: Mauri Pole, MD;  Location: [place];  Service: Orthopedics;  Laterality: Left;   IR GENERIC HISTORICAL  03/01/2016   IR ANGIO INTRA EXTRACRAN SEL COM CAROTID INNOMINATE UNI R MOD SED 03/01/2016 [name], MD [facility]   IR GENERIC HISTORICAL  03/01/2016   IR ENDOVASC INTRACRANIAL INF OTHER THAN THROMBO ART INC DIAG ANGIO 03/01/2016 [name], MD [facility]   IR GENERIC HISTORICAL  03/01/2016   IR INTRAVSC STENT CERV CAROTID W/O EMB-PROT MOD SED INC ANGIO 03/01/2016 [name], MD [facility]   IR GENERIC HISTORICAL  06/03/2016   IR RADIOLOGIST EVAL & MGMT 06/03/2016 [facility]   LOWER EXTREMITY ANGIOGRAPHY Left 02/09/2019   Procedure: LOWER EXTREMITY ANGIOGRAPHY;  Surgeon: Katha Cabal, MD;  Location: [place] [facility];  Service: Cardiovascular;  Laterality: Left;   ORIF TOE FRACTURE Right 02/08/2014   Procedure: OPEN REDUCTION INTERNAL FIXATION Right METATARSAL  FRACTURE ;  Surgeon: Wylene Simmer, MD;  Location: [place];  Service: Orthopedics;  Laterality: Right;   PERIPHERAL VASCULAR CATHETERIZATION N/A 12/28/2015   Procedure: Abdominal Aortogram w/Lower Extremity;  Surgeon: Conrad Denton, MD;  Location: [place] [facility];  Service: Cardiovascular;  Laterality: N/A;    RADIOLOGY WITH ANESTHESIA N/A 03/01/2016   Procedure: RADIOLOGY WITH ANESTHESIA;  Surgeon: [name], MD;  Location: [place];  Service: Radiology;  Laterality: N/A;   VEIN HARVEST Right 02/28/2016   Procedure: RIGHT GREATER SAPHENOUS VEIN HARVEST;  Surgeon: Conrad Trowbridge, MD;  Location[facility];  Service: Vascular;  Laterality: Right;    Social History Social History   Tobacco Use   Smoking status: Never Smoker   Smokeless tobacco: Never Used   Tobacco comment: Never smoked  Substance Use Topics   Alcohol use: No    Alcohol/week: 0.0 standard drinks   Drug use: No    Family History Family History  Problem Relation Age of Onset   Diabetes Other    Liver disease Mother  CVA Father    Diabetes Father    Hypertension Father     Allergies  Allergen Reactions   Iodine Anaphylaxis   Penicillins Anaphylaxis    Tolerates ceftriaxone, cefazolin Has patient had a PCN reaction causing immediate rash, facial/tongue/throat swelling, SOB or lightheadedness with hypotension: Unknown Has patient had a PCN reaction causing severe rash involving mucus membranes or skin necrosis: Unknown Has patient had a PCN reaction that required hospitalization: Unknown Has patient had a PCN reaction occurring within the last 10 years: Unknown If all of the above answers are "NO", then may proceed with Cephalosporin u   Shellfish Allergy Anaphylaxis   Shellfish-Derived Products Anaphylaxis   Sulfa Antibiotics Anaphylaxis   Sulfacetamide Sodium Anaphylaxis   Sulfasalazine Anaphylaxis   Eggs Or Egg-Derived Products     Other reaction(s): SHORTNESS OF BREATH   Morphine And Related Other (See Comments)    Altered mental status     REVIEW OF SYSTEMS (Negative unless checked)  Constitutional: [] Weight loss  [] Fever  [] Chills Cardiac: [] Chest pain   [] Chest pressure   [] Palpitations   [] Shortness of breath when laying flat   [] Shortness of breath with exertion. Vascular:   [] Pain in legs with walking   [x] Pain in legs at rest  [] History of DVT   [] Phlebitis   [] Swelling in legs   [] Varicose veins   [x] Non-healing ulcers Pulmonary:   [] Uses home oxygen   [] Productive cough   [] Hemoptysis   [] Wheeze  [] COPD   [] Asthma Neurologic:  [] Dizziness   [] Seizures   [] History of stroke   [] History of TIA  [] Aphasia   [] Vissual changes   [] Weakness or numbness in arm   [] Weakness or numbness in leg Musculoskeletal:   [] Joint swelling   [] Joint pain   [] Low back pain Hematologic:  [] Easy bruising  [] Easy bleeding   [] Hypercoagulable state   [] Anemic Gastrointestinal:  [] Diarrhea   [] Vomiting  [] Gastroesophageal reflux/heartburn   [] Difficulty swallowing. Genitourinary:  [] Chronic kidney disease   [] Difficult urination  [] Frequent urination   [] Blood in urine Skin:  [] Rashes   [x] Ulcers  Psychological:  [] History of anxiety   []  History of major depression.  Physical Examination  Vitals:   03/01/19 1112  BP: (!) 142/76  Pulse: 80  Resp: 16   There is no height or weight on file to calculate BMI. Gen: WD/WN, NAD Head: [place]/AT, No temporalis wasting.  Ear/Nose/Throat: Hearing grossly intact, nares w/o erythema or drainage Eyes: PER, EOMI, sclera nonicteric.  Neck: Supple, no large masses.   Pulmonary:  Good air movement, no audible wheezing bilaterally, no use of accessory muscles.  Cardiac: RRR, no JVD Vascular: ulcer of left third toe with gangrene Vessel Right Left  PT Not Palpable Not Palpable  DP Not Palpable Not Palpable  Gastrointestinal: Non-distended. No guarding/no peritoneal signs.  Musculoskeletal: M/S 0/5 legs 3/5 arms.  No deformity or atrophy.  Neurologic: CN 2-12 intact. Symmetrical.  Speech is fluent. Motor exam as listed above. Psychiatric: Judgment intact, Mood & affect appropriate for pt's clinical situation. Dermatologic: No rashes or ulcers noted.  No changes consistent with cellulitis. Lymph : No lichenification or skin changes of chronic  lymphedema.  CBC Lab Results  Component Value Date   WBC 11.8 (H) 12/26/2018   HGB 13.3 12/26/2018   HCT 40.5 12/26/2018   MCV 80.4 12/26/2018   PLT 337 12/26/2018    BMET    Component Value Date/Time   NA 135 12/28/2018 0536   K 3.0 (L) 12/28/2018 7628  CL 96 (L) 12/28/2018 0536   CO2 29 12/28/2018 0536   GLUCOSE 109 (H) 12/28/2018 0536   BUN 13 02/09/2019 0914   CREATININE 0.53 02/09/2019 0914   CALCIUM 8.6 (L) 12/28/2018 0536   GFRNONAA >60 02/09/2019 0914   GFRAA >60 02/09/2019 0914   CrCl cannot be calculated (Unknown ideal weight.).  COAG Lab Results  Component Value Date   INR 1.2 12/23/2018   INR 1.07 09/16/2016   INR 1.20 02/26/2016    Radiology Vas [place] Burnard Bunting With/wo Tbi  Result Date: 03/01/2019 LOWER EXTREMITY DOPPLER STUDY Indications: Ulceration, gangrene, and peripheral artery disease.  Comparison Study: 12/12/2017 Performing Technologist: Charlane Ferretti RT (R)(VS)  Examination Guidelines: A complete evaluation includes at minimum, Doppler waveform signals and systolic blood pressure reading at the level of bilateral brachial, anterior tibial, and posterior tibial arteries, when vessel segments are accessible. Bilateral testing is considered an integral part of a complete examination. Photoelectric Plethysmograph (PPG) waveforms and toe systolic pressure readings are included as required and additional duplex testing as needed. Limited examinations for reoccurring indications may be performed as noted.  ABI Findings: +---------+------------------+-----+----------+--------+  Right     Rt Pressure (mmHg) Index Waveform   Comment   +---------+------------------+-----+----------+--------+  Brachial  178                                           +---------+------------------+-----+----------+--------+  ATA       52                 0.29  monophasic           +---------+------------------+-----+----------+--------+  PTA       113                0.63  monophasic            +---------+------------------+-----+----------+--------+  Great Toe 66                 0.37  Abnormal             +---------+------------------+-----+----------+--------+ +---------+------------------+-----+----------+-----------------+  Left      Lt Pressure (mmHg) Index Waveform   Comment            +---------+------------------+-----+----------+-----------------+  Brachial  171                                                    +---------+------------------+-----+----------+-----------------+  ATA                                monophasic Non comprerssable  +---------+------------------+-----+----------+-----------------+  PTA       139                0.78  monophasic                    +---------+------------------+-----+----------+-----------------+  Great Toe 32                 0.18  Dampened                      +---------+------------------+-----+----------+-----------------+ +-------+-----------+-----------+------------+------------+  ABI/TBI Today's ABI Today's TBI Previous ABI Previous TBI  +-------+-----------+-----------+------------+------------+  Right   .  63         .37         .84          .62           +-------+-----------+-----------+------------+------------+  Left    .78         .18         .82          .75           +-------+-----------+-----------+------------+------------+ Bilateral ABIs appear decreased compared to prior study on 12/12/2017. Bilateral TBIs appear decreased compared to prior study on 12/12/2017.  Summary: Right: Resting right ankle-brachial index indicates moderate right lower extremity arterial disease. The right toe-brachial index is abnormal. Left: Resting left ankle-brachial index indicates moderate left lower extremity arterial disease. The left toe-brachial index is abnormal.  *See table(s) above for measurements and observations.  Electronically signed by Hortencia Pilar MD on 03/01/2019 at 5:23:48 PM.   Final       Assessment/Plan 1. 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  This represents a limb threatening ischemia and places the patient at the risk for limb loss.  Patient should undergo angiography of the left lower extremity with the hope for intervention for limb salvage.  The risks and benefits as well as the alternative therapies was discussed in detail with the patient.  All questions were answered.  Patient agrees to proceed with left leg intervention and angiography.  The patient will follow up with me in the office after the procedure.    2. Benign essential HTN Continue antihypertensive medications as already ordered, these medications have been reviewed and there are no changes at this time.   3. Chronic diastolic congestive heart failure (HCC) Continue cardiac and antihypertensive medications as already ordered and reviewed, no changes at this time.  Continue statin as ordered and reviewed, no changes at this time  Nitrates PRN for chest pain   4. Type 2 diabetes mellitus with other circulatory complication, with long-term current use of insulin (HCC) Continue hypoglycemic medications as already ordered, these medications have been reviewed and there are no changes at this time.  Hgb A1C to be monitored as already arranged by primary service   5. Mixed hyperlipidemia Continue statin as ordered and reviewed, no changes at this time     Hortencia Pilar, MD  03/01/2019 9:34 PM

## 2019-03-01 NOTE — Telephone Encounter (Signed)
Patient was seen today in office and was scheduled for a angio with Dr. Delana Meyer. Patient is scheduled on 03/10/2019 with a 10:30 am arrival time and will do her Covid testing the same morning at 8:00 am. Pre-procedure paperwork was given to the patient.

## 2019-03-09 ENCOUNTER — Other Ambulatory Visit (INDEPENDENT_AMBULATORY_CARE_PROVIDER_SITE_OTHER): Payer: Self-pay | Admitting: Nurse Practitioner

## 2019-03-09 MED ORDER — CLINDAMYCIN PHOSPHATE 300 MG/50ML IV SOLN
300.0000 mg | Freq: Once | INTRAVENOUS | Status: AC
  Administered 2019-03-10: 300 mg via INTRAVENOUS

## 2019-03-10 ENCOUNTER — Encounter: Payer: Self-pay | Admitting: *Deleted

## 2019-03-10 ENCOUNTER — Other Ambulatory Visit
Admission: RE | Admit: 2019-03-10 | Discharge: 2019-03-10 | Disposition: A | Discharge status: Home / Self Care | Payer: Medicare HMO | Source: Ambulatory Visit | Attending: Vascular Surgery | Admitting: Vascular Surgery

## 2019-03-10 ENCOUNTER — Other Ambulatory Visit: Payer: Self-pay

## 2019-03-10 ENCOUNTER — Ambulatory Visit
Admission: RE | Admit: 2019-03-10 | Discharge: 2019-03-10 | Disposition: A | Discharge status: Home / Self Care | Payer: Medicare HMO | Attending: Vascular Surgery | Admitting: Vascular Surgery

## 2019-03-10 ENCOUNTER — Encounter
Admission: RE | Disposition: A | Discharge status: Home / Self Care | Payer: Self-pay | Source: Home / Self Care | Attending: Vascular Surgery

## 2019-03-10 DIAGNOSIS — Z833 Family history of diabetes mellitus: Secondary | ICD-10-CM | POA: Diagnosis not present

## 2019-03-10 DIAGNOSIS — Z95828 Presence of other vascular implants and grafts: Secondary | ICD-10-CM | POA: Diagnosis not present

## 2019-03-10 DIAGNOSIS — I70263 Atherosclerosis of native arteries of extremities with gangrene, bilateral legs: Secondary | ICD-10-CM | POA: Insufficient documentation

## 2019-03-10 DIAGNOSIS — I110 Hypertensive heart disease with heart failure: Secondary | ICD-10-CM | POA: Insufficient documentation

## 2019-03-10 DIAGNOSIS — I70222 Atherosclerosis of native arteries of extremities with rest pain, left leg: Secondary | ICD-10-CM | POA: Diagnosis not present

## 2019-03-10 DIAGNOSIS — L97519 Non-pressure chronic ulcer of other part of right foot with unspecified severity: Secondary | ICD-10-CM | POA: Diagnosis not present

## 2019-03-10 DIAGNOSIS — E11621 Type 2 diabetes mellitus with foot ulcer: Secondary | ICD-10-CM | POA: Insufficient documentation

## 2019-03-10 DIAGNOSIS — E1140 Type 2 diabetes mellitus with diabetic neuropathy, unspecified: Secondary | ICD-10-CM | POA: Insufficient documentation

## 2019-03-10 DIAGNOSIS — Z8673 Personal history of transient ischemic attack (TIA), and cerebral infarction without residual deficits: Secondary | ICD-10-CM | POA: Insufficient documentation

## 2019-03-10 DIAGNOSIS — I70269 Atherosclerosis of native arteries of extremities with gangrene, unspecified extremity: Secondary | ICD-10-CM

## 2019-03-10 DIAGNOSIS — Z882 Allergy status to sulfonamides status: Secondary | ICD-10-CM | POA: Diagnosis not present

## 2019-03-10 DIAGNOSIS — K219 Gastro-esophageal reflux disease without esophagitis: Secondary | ICD-10-CM | POA: Insufficient documentation

## 2019-03-10 DIAGNOSIS — Z20828 Contact with and (suspected) exposure to other viral communicable diseases: Secondary | ICD-10-CM | POA: Insufficient documentation

## 2019-03-10 DIAGNOSIS — Z79899 Other long term (current) drug therapy: Secondary | ICD-10-CM | POA: Diagnosis not present

## 2019-03-10 DIAGNOSIS — Z880 Allergy status to penicillin: Secondary | ICD-10-CM | POA: Diagnosis not present

## 2019-03-10 DIAGNOSIS — Z7902 Long term (current) use of antithrombotics/antiplatelets: Secondary | ICD-10-CM | POA: Insufficient documentation

## 2019-03-10 DIAGNOSIS — Z794 Long term (current) use of insulin: Secondary | ICD-10-CM | POA: Insufficient documentation

## 2019-03-10 DIAGNOSIS — Z823 Family history of stroke: Secondary | ICD-10-CM | POA: Insufficient documentation

## 2019-03-10 DIAGNOSIS — Z7982 Long term (current) use of aspirin: Secondary | ICD-10-CM | POA: Diagnosis not present

## 2019-03-10 DIAGNOSIS — Z8249 Family history of ischemic heart disease and other diseases of the circulatory system: Secondary | ICD-10-CM | POA: Insufficient documentation

## 2019-03-10 DIAGNOSIS — L97529 Non-pressure chronic ulcer of other part of left foot with unspecified severity: Secondary | ICD-10-CM | POA: Diagnosis not present

## 2019-03-10 DIAGNOSIS — I5032 Chronic diastolic (congestive) heart failure: Secondary | ICD-10-CM | POA: Insufficient documentation

## 2019-03-10 DIAGNOSIS — Z7951 Long term (current) use of inhaled steroids: Secondary | ICD-10-CM | POA: Insufficient documentation

## 2019-03-10 DIAGNOSIS — Z885 Allergy status to narcotic agent status: Secondary | ICD-10-CM | POA: Diagnosis not present

## 2019-03-10 DIAGNOSIS — E782 Mixed hyperlipidemia: Secondary | ICD-10-CM | POA: Diagnosis not present

## 2019-03-10 HISTORY — PX: LOWER EXTREMITY ANGIOGRAPHY: CATH118251

## 2019-03-10 LAB — CREATININE, SERUM
Creatinine, Ser: 0.52 mg/dL (ref 0.44–1.00)
GFR calc Af Amer: >60 mL/min (ref >60)
GFR calc non Af Amer: >60 mL/min (ref >60)

## 2019-03-10 LAB — BUN: BUN: 13 mg/dL (ref 8–23)

## 2019-03-10 LAB — GLUCOSE, CAPILLARY
Glucose-Capillary: 179 mg/dL — ABNORMAL HIGH (ref 70–99)
Glucose-Capillary: 168 mg/dL — ABNORMAL HIGH (ref 70–99)

## 2019-03-10 LAB — SARS CORONAVIRUS 2 BY RT PCR (HOSPITAL ORDER, PERFORMED IN ~~LOC~~ HOSPITAL LAB): SARS Coronavirus 2: NEGATIVE

## 2019-03-10 SURGERY — LOWER EXTREMITY ANGIOGRAPHY
Anesthesia: Moderate Sedation | Site: Leg Lower | Laterality: Left

## 2019-03-10 MED ORDER — FAMOTIDINE 20 MG PO TABS: 40.0000 mg | ORAL_TABLET | Freq: Once | ORAL | Status: DC | PRN

## 2019-03-10 MED ORDER — FENTANYL CITRATE (PF) 100 MCG/2ML IJ SOLN
INTRAMUSCULAR | Status: DC | PRN
  Administered 2019-03-10: 12.5 ug via INTRAVENOUS
  Administered 2019-03-10: 25 ug via INTRAVENOUS

## 2019-03-10 MED ORDER — HEPARIN SODIUM (PORCINE) 1000 UNIT/ML IJ SOLN
INTRAMUSCULAR | Status: AC
  Filled 2019-03-10: qty 1

## 2019-03-10 MED ORDER — MIDAZOLAM HCL 5 MG/5ML IJ SOLN
INTRAMUSCULAR | Status: AC
  Filled 2019-03-10: qty 5

## 2019-03-10 MED ORDER — HYDRALAZINE HCL 20 MG/ML IJ SOLN
INTRAMUSCULAR | Status: AC
  Administered 2019-03-10: 14:00:00 10 mg via INTRAVENOUS
  Filled 2019-03-10: qty 1

## 2019-03-10 MED ORDER — SODIUM CHLORIDE 0.9 % IV SOLN
INTRAVENOUS | Status: DC
  Administered 2019-03-10: 75 mL/h via INTRAVENOUS

## 2019-03-10 MED ORDER — LABETALOL HCL 5 MG/ML IV SOLN
INTRAVENOUS | Status: AC
  Administered 2019-03-10: 13:00:00 10 mg via INTRAVENOUS
  Filled 2019-03-10: qty 4

## 2019-03-10 MED ORDER — ONDANSETRON HCL 4 MG/2ML IJ SOLN: 4.0000 mg | Freq: Four times a day (QID) | INTRAMUSCULAR | Status: DC | PRN

## 2019-03-10 MED ORDER — METHYLPREDNISOLONE SODIUM SUCC 125 MG IJ SOLR
125.0000 mg | Freq: Once | INTRAMUSCULAR | Status: AC | PRN
  Administered 2019-03-10: 11:00:00 125 mg via INTRAVENOUS

## 2019-03-10 MED ORDER — SODIUM CHLORIDE 0.9 % IV SOLN: Freq: Once | INTRAVENOUS | Status: DC

## 2019-03-10 MED ORDER — HEPARIN SODIUM (PORCINE) 1000 UNIT/ML IJ SOLN
INTRAMUSCULAR | Status: DC | PRN
  Administered 2019-03-10: 5000 [IU] via INTRAVENOUS

## 2019-03-10 MED ORDER — CLINDAMYCIN PHOSPHATE 300 MG/50ML IV SOLN
INTRAVENOUS | Status: AC
  Administered 2019-03-10: 11:00:00 300 mg via INTRAVENOUS
  Filled 2019-03-10: qty 50

## 2019-03-10 MED ORDER — LABETALOL HCL 5 MG/ML IV SOLN
10.0000 mg | Freq: Once | INTRAVENOUS | Status: AC
  Administered 2019-03-10: 13:00:00 10 mg via INTRAVENOUS

## 2019-03-10 MED ORDER — MIDAZOLAM HCL 2 MG/2ML IJ SOLN
INTRAMUSCULAR | Status: DC | PRN
  Administered 2019-03-10: 1 mg via INTRAVENOUS
  Administered 2019-03-10: 0.5 mg via INTRAVENOUS

## 2019-03-10 MED ORDER — DIPHENHYDRAMINE HCL 50 MG/ML IJ SOLN
INTRAMUSCULAR | Status: DC | PRN
  Administered 2019-03-10: 25 mg via INTRAVENOUS

## 2019-03-10 MED ORDER — FENTANYL CITRATE (PF) 100 MCG/2ML IJ SOLN
INTRAMUSCULAR | Status: AC
  Filled 2019-03-10: qty 2

## 2019-03-10 MED ORDER — METHYLPREDNISOLONE SODIUM SUCC 125 MG IJ SOLR
INTRAMUSCULAR | Status: AC
  Administered 2019-03-10: 11:00:00 125 mg via INTRAVENOUS
  Filled 2019-03-10: qty 2

## 2019-03-10 MED ORDER — HYDRALAZINE HCL 20 MG/ML IJ SOLN
10.0000 mg | Freq: Once | INTRAMUSCULAR | Status: AC
  Administered 2019-03-10: 10 mg via INTRAVENOUS

## 2019-03-10 MED ORDER — MIDAZOLAM HCL 2 MG/ML PO SYRP: 8.0000 mg | ORAL_SOLUTION | Freq: Once | ORAL | Status: DC | PRN

## 2019-03-10 MED ORDER — DIPHENHYDRAMINE HCL 50 MG/ML IJ SOLN: 50.0000 mg | Freq: Once | INTRAMUSCULAR | Status: DC | PRN

## 2019-03-10 MED ORDER — LABETALOL HCL 5 MG/ML IV SOLN
10.0000 mg | Freq: Once | INTRAVENOUS | Status: AC
  Administered 2019-03-10: 10 mg via INTRAVENOUS

## 2019-03-10 MED ORDER — DIPHENHYDRAMINE HCL 50 MG/ML IJ SOLN
INTRAMUSCULAR | Status: AC
  Filled 2019-03-10: qty 1

## 2019-03-10 MED ORDER — HYDROMORPHONE HCL 1 MG/ML IJ SOLN: 1.0000 mg | Freq: Once | INTRAMUSCULAR | Status: DC | PRN

## 2019-03-10 SURGICAL SUPPLY — 22 items
BALLN LUTONIX 018 5X60X130 (BALLOONS) ×3
BALLN ULTRASCORE 014 3X100X150 (BALLOONS) ×3
BALLN ULTRASCORE 014 4X40X150 (BALLOONS) ×3
BALLOON LUTONIX 018 5X60X130 (BALLOONS) ×1 IMPLANT
BALLOON ULTRSCRE 014 3X100X150 (BALLOONS) ×1 IMPLANT
BALLOON ULTRSCRE 014 4X40X150 (BALLOONS) ×1 IMPLANT
CATH PIG 70CM (CATHETERS) ×3 IMPLANT
CATH VERT 5FR 125CM (CATHETERS) ×3 IMPLANT
COVER PROBE U/S 5X48 (MISCELLANEOUS) ×3 IMPLANT
DEVICE PRESTO INFLATION (MISCELLANEOUS) ×3 IMPLANT
DEVICE STARCLOSE SE CLOSURE (Vascular Products) ×3 IMPLANT
DEVICE TORQUE .025-.038 (MISCELLANEOUS) ×3 IMPLANT
NEEDLE ENTRY 21GA 7CM ECHOTIP (NEEDLE) ×3 IMPLANT
PACK ANGIOGRAPHY (CUSTOM PROCEDURE TRAY) ×3 IMPLANT
SET INTRO CAPELLA COAXIAL (SET/KITS/TRAYS/PACK) ×3 IMPLANT
SHEATH BRITE TIP 5FRX11 (SHEATH) ×3 IMPLANT
SHEATH SHUTTLE 6FR (SHEATH) ×3 IMPLANT
TUBING CONTRAST HIGH PRESS 72 (TUBING) ×3 IMPLANT
VALVE CHECKFLO PERFORMER (SHEATH) ×3 IMPLANT
WIRE AQUATRACK .035X260CM (WIRE) ×3 IMPLANT
WIRE J 3MM .035X145CM (WIRE) ×3 IMPLANT
WIRE RUNTHROUGH .014X300CM (WIRE) ×3 IMPLANT

## 2019-03-10 NOTE — Op Note (Signed)
[facility] Percutaneous Study/Intervention Procedural Note   Date of Surgery: 03/10/2019  Surgeon: Hortencia Pilar  Pre-operative Diagnosis: Atherosclerotic occlusive disease bilateral lower extremities with rest pain left lower extremity  Post-operative diagnosis: Same  Procedure(s) Performed: 1. Introduction catheter into left lower extremity 3rd order catheter placement  2. Contrast injection left lower extremity for distal runoff  3. Percutaneous transluminal angioplasty to 5 mm with a Lutonix drug-eluting balloon left popliteal artery 4. Percutaneous transluminal angioplasty left anterior tibial to 3 mm  5. Star close closure right common femoral arteriotomy  Anesthesia: Conscious sedation was administered under my direct supervision by the interventional radiology RN. IV Versed plus fentanyl were utilized. Continuous ECG, pulse oximetry and blood pressure was monitored throughout the entire procedure.  Conscious sedation was for a total of 1 hour 17 minutes.  Sheath: 90 cm 6 [place] shuttle sheath right common femoral retrograde  Contrast: 110 cc  Fluoroscopy Time: 12.4 minutes  Indications: [name] presents with increasing pain of the left lower extremity.  The patient recently underwent angiography where an occlusion of the popliteal was crossed as well as multiple greater than 80% stenoses in the anterior tibial these were treated with balloon angioplasty which at the time of angiography looked excellent.  Unfortunately when she returned to the office for her follow-up her ABI had actually decreased from 0.8 2.6 and she was still having significant pain in the left foot.  Based on these findings I am bring her back to the angios suite to determine what has changed and correct this.  This suggests the patient is having limb threatening ischemia. The risks and benefits are reviewed all  questions answered patient agrees to proceed.  Procedure:[name] is a 78 y.o. y.o. female who was identified and appropriate procedural time out was performed. The patient was then placed supine on the table and prepped and draped in the usual sterile fashion.   Ultrasound was placed in the sterile sleeve and the right groin was evaluated the right common femoral artery was echolucent and pulsatile indicating patency. Image was recorded for the permanent record and under real-time visualization a microneedle was inserted into the common femoral artery followed by the microwire and then the micro-sheath. A J-wire was then advanced through the micro-sheath and a 5 [place] sheath was then inserted over a J-wire. J-wire was then advanced and a 5 French pigtail catheter was positioned at the level of T12.  AP projection of the aorta was then obtained. Pigtail catheter was repositioned to above the bifurcation and a RAO view of the pelvis was obtained. Subsequently a pigtail catheter with the aqua track wire was used to cross the aortic bifurcation the catheter wire were advanced down into the left distal external iliac artery. Oblique view of the femoral bifurcation was then obtained and subsequently the wire was reintroduced and the pigtail catheter negotiated into the SFA representing third order catheter placement. Distal runoff was then performed.  Diagnostic interpretation: The abdominal aorta remains patent and free of hemodynamically significant stenoses.  There are no changes noted.  Aortic bifurcation is widely patent and bilateral common and external iliac arteries demonstrate mild atherosclerotic changes.  No new lesions or hemodynamically significant stenoses are identified.  The left common femoral profunda femoris and superficial femoral arteries demonstrate mild disease but are patent overall.  Again in comparison to her previous angiogram no significant changes identified.  The  popliteal is patent.  This is improved compared to the  original angiogram which demonstrated a popliteal occlusion.  However, in the mid popliteal at the level of the tibial plateau there is a greater than 60% stenosis.  This is significantly worse in appearance when compared to the final imaging of the initial angiogram.  The trifurcation demonstrates significant disease however the anterior tibial is patent.  On imaging there are 2 lesions noted both of which are greater than 80%.  I suspect these represent a recoil when compared to the initial angiogram.  At the level of the ankle runoff is unchanged.  Based on these findings I elected to re-treat the mid popliteal as well as the anterior tibial arteries  5000 units of heparin was then given and allowed to circulate and a 90 cm shuttle sheath was advanced up and over the bifurcation and positioned in the distal superficial femoral artery.  Kumpe catheter and aqua track wire were then negotiated down into the distal popliteal. Catheter was then advanced. Hand injection contrast demonstrated the anterior tibial anatomy in detail.  A 0.014 wire was then negotiated down to the level of the ankle.  Hand-injection of contrast through the Kumpe catheter was used to make sure that we were luminal distally.  With the 014 wire in position and a 3 mm x 100 mm ultra score balloon was used to angioplasty the anterior tibial and its proximal one third. Each inflation was for 2 minutes at 12 atm. Follow-up imaging demonstrated excellent patency with preservation of the distal runoff and less than 10% residual stenosis.  The detector was then repositioned and the popliteal was reimaged as noted a greater than 60% stenosis is noted in this area and after appropriate measurements are made a 4 mm x 40 mm ultra score balloon is advanced across the center of this lesion inflation is to 10 atm for 1 minute.  Next, a 5 mm x 100 mm Lutonix balloon was used to angioplasty the  popliteal artery. Inflation was to 12 atmospheres for 2 minutes. Follow-up imaging demonstrated patency with excellent result and less than 5% residual stenosis. Distal runoff was then reassessed and noted to be widely patent.   After review of these images the sheath is pulled into the right external iliac oblique of the common femoral is obtained and a Star close device deployed. There no immediate Complications.  Findings: The abdominal aorta remains patent and free of hemodynamically significant stenoses.  There are no changes noted.  Aortic bifurcation is widely patent and bilateral common and external iliac arteries demonstrate mild atherosclerotic changes.  No new lesions or hemodynamically significant stenoses are identified.  The left common femoral profunda femoris and superficial femoral arteries demonstrate mild disease but are patent overall.  Again in comparison to her previous angiogram no significant changes identified.  The popliteal is patent.  This is improved compared to the original angiogram which demonstrated a popliteal occlusion.  However, in the mid popliteal at the level of the tibial plateau there is a greater than 60% stenosis.  This is significantly worse in appearance when compared to the final imaging of the initial angiogram.  The trifurcation demonstrates significant disease however the anterior tibial is patent.  On imaging there are 2 lesions noted both of which are greater than 80%.  I suspect these represent a recoil when compared to the initial angiogram.  At the level of the ankle runoff is unchanged.  Following angioplasty anterior  tibial now is in-line flow and looks quite nice with <5% residual stenosis. Angioplasty  of the popliteal yields an excellent result with less than 5% residual stenosis.  Summary: Successful recanalization left lower extremity for limb salvage   Disposition: Patient was taken to the recovery room in stable  condition having tolerated the procedure well.  [name]  03/10/2019,12:15 PM

## 2019-03-10 NOTE — H&P (Signed)
 [facility] History & Physical Update  The patient was interviewed and re-examined.  The patient's previous History and Physical has been reviewed and is unchanged.  There is no change in the plan of care. We plan to proceed with the scheduled procedure.  Hortencia Pilar, MD  03/10/2019, 10:32 AM

## 2019-03-15 ENCOUNTER — Encounter: Payer: Self-pay | Admitting: Vascular Surgery

## 2019-03-29 ENCOUNTER — Ambulatory Visit (INDEPENDENT_AMBULATORY_CARE_PROVIDER_SITE_OTHER): Payer: Medicare HMO

## 2019-03-29 ENCOUNTER — Encounter (INDEPENDENT_AMBULATORY_CARE_PROVIDER_SITE_OTHER): Payer: Self-pay | Admitting: Vascular Surgery

## 2019-03-29 ENCOUNTER — Other Ambulatory Visit (INDEPENDENT_AMBULATORY_CARE_PROVIDER_SITE_OTHER): Payer: Self-pay | Admitting: Vascular Surgery

## 2019-03-29 ENCOUNTER — Other Ambulatory Visit: Payer: Self-pay

## 2019-03-29 ENCOUNTER — Ambulatory Visit (INDEPENDENT_AMBULATORY_CARE_PROVIDER_SITE_OTHER): Payer: Medicare HMO | Admitting: Vascular Surgery

## 2019-03-29 VITALS — BP 142/75 | HR 78 | Resp 14 | Ht 65.0 in

## 2019-03-29 DIAGNOSIS — S81809A Unspecified open wound, unspecified lower leg, initial encounter: Secondary | ICD-10-CM

## 2019-03-29 DIAGNOSIS — I70222 Atherosclerosis of native arteries of extremities with rest pain, left leg: Secondary | ICD-10-CM

## 2019-03-29 DIAGNOSIS — E1159 Type 2 diabetes mellitus with other circulatory complications: Secondary | ICD-10-CM

## 2019-03-29 DIAGNOSIS — I5032 Chronic diastolic (congestive) heart failure: Secondary | ICD-10-CM

## 2019-03-29 DIAGNOSIS — Z9862 Peripheral vascular angioplasty status: Secondary | ICD-10-CM

## 2019-03-29 DIAGNOSIS — E782 Mixed hyperlipidemia: Secondary | ICD-10-CM | POA: Diagnosis not present

## 2019-03-29 DIAGNOSIS — I70263 Atherosclerosis of native arteries of extremities with gangrene, bilateral legs: Secondary | ICD-10-CM | POA: Diagnosis not present

## 2019-03-29 DIAGNOSIS — Z794 Long term (current) use of insulin: Secondary | ICD-10-CM

## 2019-03-29 DIAGNOSIS — I10 Essential (primary) hypertension: Secondary | ICD-10-CM | POA: Diagnosis not present

## 2019-03-29 NOTE — Progress Notes (Signed)
MRN : [id number]  [name] is a 78 y.o. (10-20-1940) female who presents with chief complaint of No chief complaint on file. [name]  History of Present Illness:   The patient returns to the office for followup and review of the noninvasive studies.  She is status post re-intervention of the left lower extremity on 03/10/2019.  At that time balloon angioplasty of the left popliteal as well as balloon angioplasty of the left anterior tibial was performed.  Postoperatively she had a palpable DP pulse on the left.  Today she denies pain in her left foot.  She is complaining of pain in the right heel.    No interval development of left rest pain symptoms. No new ulcers or wounds have occurred since the last visit.  There have been no significant changes to the patient's overall health care.  The patient denies amaurosis fugax or recent TIA symptoms. There are no recent neurological changes noted. The patient denies history of DVT, PE or superficial thrombophlebitis. The patient denies recent episodes of angina or shortness of breath.   ABI Rt=0.52 and Lt=0.87  (previous ABI's Rt=0.63 and Lt=0.78)   No outpatient medications have been marked as taking for the 03/29/19 encounter (Appointment) with Delana Meyer, Dolores Lory, MD.    Past Medical History:  Diagnosis Date  . Acute heart failure ([place])   . Aortic stenosis   . CHF (congestive heart failure) ([place])   . Complication of anesthesia    Hard to wake patient up after having anesthesia  . Diabetes mellitus without complication ([place])   . Diabetic neuropathy (HCC)    Takes Lyrica  . Edema of both legs    Takes Lasix  . GERD (gastroesophageal reflux disease)   . High cholesterol   . HTN (hypertension)   . Hypertension   . PAD (peripheral artery disease) ([place])   . Shortness of breath dyspnea   . Spasm of back muscles   . Stroke ([place])   . Wound, open    Right great toe    Past Surgical History:  Procedure Laterality Date  . BYPASS  GRAFT POPLITEAL TO TIBIAL Right 02/28/2016   Procedure: BYPASS GRAFT RIGHT BELOW KNEE POPLITEAL TO PERONEAL USING REVERSED RIGHT GREATER SAPHENOUS VEIN;  Surgeon: Conrad Bellefontaine, MD;  Location: [facility];  Service: Vascular;  Laterality: Right;  . CYST EXCISION     Abdomen  . EYE SURGERY Bilateral    Cataract removal  . INTRAMEDULLARY (IM) NAIL INTERTROCHANTERIC Left 02/04/2014   Procedure: INTRAMEDULLARY (IM) NAIL INTERTROCHANTRIC FEMORAL;  Surgeon: Mauri Pole, MD;  Location: [place];  Service: Orthopedics;  Laterality: Left;  . IR GENERIC HISTORICAL  03/01/2016   IR ANGIO INTRA EXTRACRAN SEL COM CAROTID INNOMINATE UNI R MOD SED 03/01/2016 [name], MD [facility]  . IR GENERIC HISTORICAL  03/01/2016   IR ENDOVASC INTRACRANIAL INF OTHER THAN THROMBO ART INC DIAG ANGIO 03/01/2016 [name], MD [facility]  . IR GENERIC HISTORICAL  03/01/2016   IR INTRAVSC STENT CERV CAROTID W/O EMB-PROT MOD SED INC ANGIO 03/01/2016 [name], MD [facility]  . IR GENERIC HISTORICAL  06/03/2016   IR RADIOLOGIST EVAL & MGMT 06/03/2016 [facility]  . LOWER EXTREMITY ANGIOGRAPHY Left 02/09/2019   Procedure: LOWER EXTREMITY ANGIOGRAPHY;  Surgeon: Katha Cabal, MD;  Location: [facility];  Service: Cardiovascular;  Laterality: Left;  . LOWER EXTREMITY ANGIOGRAPHY Left 03/10/2019   Procedure: LOWER EXTREMITY ANGIOGRAPHY;  Surgeon: Katha Cabal, MD;  Location: [facility];  Service: Cardiovascular;  Laterality: Left;  . ORIF TOE FRACTURE Right 02/08/2014   Procedure: OPEN REDUCTION INTERNAL FIXATION Right METATARSAL  FRACTURE ;  Surgeon: Wylene Simmer, MD;  Location: [facility];  Service: Orthopedics;  Laterality: Right;  . PERIPHERAL VASCULAR CATHETERIZATION N/A 12/28/2015   Procedure: Abdominal Aortogram w/Lower Extremity;  Surgeon: Conrad Atlantic, MD;  Location: [facility] [facility];  Service: Cardiovascular;  Laterality: N/A;  . RADIOLOGY WITH ANESTHESIA N/A 03/01/2016   Procedure:  RADIOLOGY WITH ANESTHESIA;  Surgeon: [name], MD;  Location: [place];  Service: Radiology;  Laterality: N/A;  . VEIN HARVEST Right 02/28/2016   Procedure: RIGHT GREATER SAPHENOUS VEIN HARVEST;  Surgeon: Conrad [place], MD;  Location: [place];  Service: Vascular;  Laterality: Right;    Social History Social History   Tobacco Use  . Smoking status: Never Smoker  . Smokeless tobacco: Never Used  . Tobacco comment: Never smoked  Substance Use Topics  . Alcohol use: No    Alcohol/week: 0.0 standard drinks  . Drug use: No    Family History Family History  Problem Relation Age of Onset  . Diabetes Other   . Liver disease Mother   . CVA Father   . Diabetes Father   . Hypertension Father     Allergies  Allergen Reactions  . Iodine Anaphylaxis  . Penicillins Anaphylaxis    Tolerates ceftriaxone, cefazolin Did it involve swelling of the face/tongue/throat, SOB, or low BP? Unknown Did it involve sudden or severe rash/hives, skin peeling, or any reaction on the inside of your mouth or nose? Unknown Did you need to seek medical attention at a hospital or doctor's office? Unknown When did it last happen?Unknown If all above answers are "NO", may proceed with cephalosporin use.   . Shellfish Allergy Anaphylaxis  . Sulfa Antibiotics Anaphylaxis  . Sulfacetamide Sodium Anaphylaxis  . Sulfasalazine Anaphylaxis  . Eggs Or Egg-Derived Products     Other reaction(s): SHORTNESS OF BREATH  . Morphine And Related Other (See Comments)    Altered mental status     REVIEW OF SYSTEMS (Negative unless checked)  Constitutional: [] Weight loss  [] Fever  [] Chills Cardiac: [] Chest pain   [] Chest pressure   [] Palpitations   [] Shortness of breath when laying flat   [] Shortness of breath with exertion. Vascular:  [] Pain in legs with walking   [] Pain in legs at rest  [] History of DVT   [] Phlebitis   [] Swelling in legs   [] Varicose veins   [] Non-healing ulcers Pulmonary:   [] Uses home oxygen    [] Productive cough   [] Hemoptysis   [] Wheeze  [] COPD   [] Asthma Neurologic:  [] Dizziness   [] Seizures   [] History of stroke   [] History of TIA  [] Aphasia   [] Vissual changes   [x] Weakness or numbness in arm   [x] Weakness or numbness in leg Musculoskeletal:   [] Joint swelling   [] Joint pain   [] Low back pain Hematologic:  [] Easy bruising  [] Easy bleeding   [] Hypercoagulable state   [] Anemic Gastrointestinal:  [] Diarrhea   [] Vomiting  [] Gastroesophageal reflux/heartburn   [] Difficulty swallowing. Genitourinary:  [] Chronic kidney disease   [] Difficult urination  [] Frequent urination   [] Blood in urine Skin:  [] Rashes   [] Ulcers  Psychological:  [] History of anxiety   []  History of major depression.  Physical Examination  There were no vitals filed for this visit. There is no height or weight on file to calculate BMI. Gen: WD/WN, NAD; debilitated seen in a  gurney Head: [place]/AT, No temporalis wasting.  Ear/Nose/Throat: Hearing grossly intact, nares w/o erythema or drainage Eyes: PER, EOMI, sclera nonicteric.  Neck: Supple, no large masses.   Pulmonary:  Good air movement, no audible wheezing bilaterally, no use of accessory muscles.  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  The toe itself is mummified down to the base.  There is no drainage there is no cellulitis.  The tip of the left great toe demonstrates some dry gangrene but this appears superficial.  The right heel is intact skin is soft and supple Vessel Right Left  PT Not Palpable Not Palpable  DP Not Palpable 2+ Palpable  Gastrointestinal: Non-distended. No guarding/no peritoneal signs.  Musculoskeletal: M/S 5/5 throughout.  No deformity or atrophy.  Neurologic: CN 2-12 intact. Symmetrical.  Speech is fluent. Motor exam as listed above. Psychiatric: Judgment intact, Mood & affect appropriate for pt's clinical situation. Dermatologic: + ulcers noted see above.  No changes consistent with cellulitis.  Lymph : No lichenification or skin changes of chronic lymphedema.  CBC Lab Results  Component Value Date   WBC 11.8 (H) 12/26/2018   HGB 13.3 12/26/2018   HCT 40.5 12/26/2018   MCV 80.4 12/26/2018   PLT 337 12/26/2018    BMET    Component Value Date/Time   NA 135 12/28/2018 0536   K 3.0 (L) 12/28/2018 0536   CL 96 (L) 12/28/2018 0536   CO2 29 12/28/2018 0536   GLUCOSE 109 (H) 12/28/2018 0536   BUN 13 03/10/2019 0932   CREATININE 0.52 03/10/2019 0932   CALCIUM 8.6 (L) 12/28/2018 0536   GFRNONAA >60 03/10/2019 0932   GFRAA >60 03/10/2019 0932   CrCl cannot be calculated (Unknown ideal weight.).  COAG Lab Results  Component Value Date   INR 1.2 12/23/2018   INR 1.07 09/16/2016   INR 1.20 02/26/2016    Radiology Vas [place] Burnard Bunting With/wo Tbi  Result Date: 03/01/2019 LOWER EXTREMITY DOPPLER STUDY Indications: Ulceration, gangrene, and peripheral artery disease.  Comparison Study: 12/12/2017 Performing Technologist: Charlane Ferretti RT (R)(VS)  Examination Guidelines: A complete evaluation includes at minimum, Doppler waveform signals and systolic blood pressure reading at the level of bilateral brachial, anterior tibial, and posterior tibial arteries, when vessel segments are accessible. Bilateral testing is considered an integral part of a complete examination. Photoelectric Plethysmograph (PPG) waveforms and toe systolic pressure readings are included as required and additional duplex testing as needed. Limited examinations for reoccurring indications may be performed as noted.  ABI Findings: +---------+------------------+-----+----------+--------+ Right    Rt Pressure (mmHg)IndexWaveform  Comment  +---------+------------------+-----+----------+--------+ Brachial 178                                       +---------+------------------+-----+----------+--------+ ATA      52                0.29 monophasic         +---------+------------------+-----+----------+--------+ PTA       113               0.63 monophasic         +---------+------------------+-----+----------+--------+ Great Toe66                0.37 Abnormal           +---------+------------------+-----+----------+--------+ +---------+------------------+-----+----------+-----------------+ Left     Lt Pressure (mmHg)IndexWaveform  Comment           +---------+------------------+-----+----------+-----------------+  Brachial 171                                                +---------+------------------+-----+----------+-----------------+ ATA                             monophasicNon comprerssable +---------+------------------+-----+----------+-----------------+ PTA      139               0.78 monophasic                  +---------+------------------+-----+----------+-----------------+ Great Toe32                0.18 Dampened                    +---------+------------------+-----+----------+-----------------+ +-------+-----------+-----------+------------+------------+ ABI/TBIToday's ABIToday's TBIPrevious ABIPrevious TBI +-------+-----------+-----------+------------+------------+ Right  .63        .37        .84         .62          +-------+-----------+-----------+------------+------------+ Left   .78        .18        .82         .75          +-------+-----------+-----------+------------+------------+ Bilateral ABIs appear decreased compared to prior study on 12/12/2017. Bilateral TBIs appear decreased compared to prior study on 12/12/2017.  Summary: Right: Resting right ankle-brachial index indicates moderate right lower extremity arterial disease. The right toe-brachial index is abnormal. Left: Resting left ankle-brachial index indicates moderate left lower extremity arterial disease. The left toe-brachial index is abnormal.  *See table(s) above for measurements and observations.  Electronically signed by Hortencia Pilar MD on 03/01/2019 at 5:23:48 PM.   Final       Assessment/Plan 1. 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  The patient reports that the leg pain is essentially gone.  She now has a palpable left DP pulse.  The patient denies lifestyle limiting changes at this point in time.    No further invasive studies, angiography or surgery at this time The patient should continue walking and begin a more formal exercise program.  The patient should continue antiplatelet therapy and aggressive treatment of the lipid abnormalities  I have discussed third toe amputation secondary to the gangrene but she is not accepting of this.  At this time there does not appear to be any drainage suggesting a purulent collection or any cellulitis.  There is nothing urgent pressing [place] to perform amputation.  Consequently, I will see her back sooner than typically (in 2 weeks) so that I may reassess her wound.  Patient should undergo noninvasive studies as ordered. The patient will follow up with me after the studies.    A total of 25 minutes was spent with this patient and greater than 50% was spent in counseling and coordination of care with the patient.  Discussion included the treatment options for vascular disease including indications for surgery and intervention.  Also discussed is the appropriate timing of treatment.  In addition medical therapy was discussed.  2. Essential hypertension Continue antihypertensive medications as already ordered, these medications have been reviewed and there are no changes at this time.   3. Type 2  diabetes mellitus with other circulatory complication, with long-term current use of insulin (HCC) Continue hypoglycemic medications as already ordered, these medications have been reviewed and there are no changes at this time.  Hgb A1C to be monitored as already arranged by primary service   4. Mixed  hyperlipidemia Continue statin as ordered and reviewed, no changes at this time   5. Chronic diastolic congestive heart failure (HCC) Continue cardiac and antihypertensive medications as already ordered and reviewed, no changes at this time.  Continue statin as ordered and reviewed, no changes at this time  Nitrates PRN for chest pain     Hortencia Pilar, MD  03/29/2019 11:38 AM

## 2019-04-01 ENCOUNTER — Telehealth (INDEPENDENT_AMBULATORY_CARE_PROVIDER_SITE_OTHER): Payer: Self-pay | Admitting: Vascular Surgery

## 2019-04-01 NOTE — Telephone Encounter (Signed)
Spoke with [name]-patient daughter and advised her that her mothers visit earlier this week went well. [name] had requested a consult with Schnier but he is unavailable at the moment and is going to be in contact with her soon. [name] verbalized understanding and thanked [place] for reaching back out to her. AS, CMA

## 2019-04-15 ENCOUNTER — Ambulatory Visit (INDEPENDENT_AMBULATORY_CARE_PROVIDER_SITE_OTHER): Payer: Medicare HMO | Admitting: Vascular Surgery

## 2019-04-15 ENCOUNTER — Other Ambulatory Visit (INDEPENDENT_AMBULATORY_CARE_PROVIDER_SITE_OTHER): Payer: Self-pay | Admitting: Vascular Surgery

## 2019-04-15 ENCOUNTER — Other Ambulatory Visit (INDEPENDENT_AMBULATORY_CARE_PROVIDER_SITE_OTHER): Payer: Medicare HMO

## 2019-04-15 ENCOUNTER — Encounter (INDEPENDENT_AMBULATORY_CARE_PROVIDER_SITE_OTHER): Payer: Self-pay | Admitting: Vascular Surgery

## 2019-04-15 ENCOUNTER — Other Ambulatory Visit: Payer: Self-pay

## 2019-04-15 ENCOUNTER — Other Ambulatory Visit: Payer: Self-pay | Admitting: Internal Medicine

## 2019-04-15 VITALS — BP 172/88 | HR 72 | Resp 18 | Ht 65.0 in

## 2019-04-15 DIAGNOSIS — E782 Mixed hyperlipidemia: Secondary | ICD-10-CM

## 2019-04-15 DIAGNOSIS — R0989 Other specified symptoms and signs involving the circulatory and respiratory systems: Secondary | ICD-10-CM | POA: Diagnosis not present

## 2019-04-15 DIAGNOSIS — Z9862 Peripheral vascular angioplasty status: Secondary | ICD-10-CM

## 2019-04-15 DIAGNOSIS — E1159 Type 2 diabetes mellitus with other circulatory complications: Secondary | ICD-10-CM | POA: Diagnosis not present

## 2019-04-15 DIAGNOSIS — I739 Peripheral vascular disease, unspecified: Secondary | ICD-10-CM

## 2019-04-15 DIAGNOSIS — Z794 Long term (current) use of insulin: Secondary | ICD-10-CM

## 2019-04-15 DIAGNOSIS — I10 Essential (primary) hypertension: Secondary | ICD-10-CM

## 2019-04-15 DIAGNOSIS — I70263 Atherosclerosis of native arteries of extremities with gangrene, bilateral legs: Secondary | ICD-10-CM | POA: Diagnosis not present

## 2019-04-15 NOTE — Progress Notes (Signed)
MRN : [id number]  [name] is a 78 y.o. (1941/05/15) female who presents with chief complaint of  Chief Complaint  Patient presents with  . Follow-up  .  History of Present Illness:   The patient returns to the office for followup and review of the noninvasive studies.  She is status post re-intervention of the left lower extremity on 03/10/2019.  At that time balloon angioplasty of the left popliteal as well as balloon angioplasty of the left anterior tibial was performed.  Postoperatively she had a palpable DP pulse on the left.  Today she denies pain in her left foot.  She is complaining of pain in the right heel.    No interval development of left rest pain symptoms. No new ulcers or wounds have occurred since the last visit.  There have been no significant changes to the patient's overall health care.  The patient denies amaurosis fugax or recent TIA symptoms. There are no recent neurological changes noted. The patient denies history of DVT, PE or superficial thrombophlebitis. The patient denies recent episodes of angina or shortness of breath.   ABI Rt=0.52 and Lt=0.87  (previous ABI's Rt=0.63 and Lt=0.78)  Current Meds  Medication Sig  . acetaminophen (TYLENOL) 500 MG tablet Take 1,000 mg by mouth every 6 (six) hours as needed for mild pain.  [name] albuterol (PROVENTIL HFA;VENTOLIN HFA) 108 (90 Base) MCG/ACT inhaler Inhale 2 puffs into the lungs every 6 (six) hours as needed for wheezing or shortness of breath.   [name] amLODipine (NORVASC) 10 MG tablet Take 10 mg by mouth daily.  [name] aspirin 325 MG tablet Take 1 tablet (325 mg total) by mouth daily with breakfast. (Patient taking differently: Take 325 mg by mouth daily. )  . atorvastatin (LIPITOR) 20 MG tablet Take 1 tablet (20 mg total) by mouth daily. (Patient taking differently: Take 20 mg by mouth at bedtime. )  . budesonide (PULMICORT) 0.25 MG/2ML nebulizer solution Take 2 mLs (0.25 mg total) by nebulization 2 (two) times  daily.  . carvedilol (COREG) 6.25 MG tablet Take 6.25 mg by mouth 2 (two) times daily with a meal.  . cetirizine (ZYRTEC) 10 MG tablet Take 10 mg by mouth daily.   . Cholecalciferol (VITAMIN D) 50 MCG (2000 UT) CAPS Take 2,000 Units by mouth daily.   . clopidogrel (PLAVIX) 75 MG tablet Take 1 tablet (75 mg total) by mouth daily with breakfast.  . docusate sodium (COLACE) 100 MG capsule Take 100 mg by mouth 2 (two) times daily.  . fluticasone (FLONASE) 50 MCG/ACT nasal spray Place 1 spray into both nostrils daily as needed for allergies.   [name] gabapentin (NEURONTIN) 300 MG capsule Take 300 mg by mouth every evening.   . hydrALAZINE (APRESOLINE) 25 MG tablet Take 1 tablet (25 mg total) by mouth every 8 (eight) hours.  . insulin glargine (LANTUS) 100 UNIT/ML injection Inject 0.15 mLs (15 Units total) into the skin at bedtime.  [name] ipratropium-albuterol (DUONEB) 0.5-2.5 (3) MG/3ML SOLN Take 3 mLs by nebulization 2 (two) times daily.  . isosorbide dinitrate (ISORDIL) 30 MG tablet Take 30 mg by mouth daily.  . magnesium oxide (MAG-OX) 400 MG tablet Take 400 mg by mouth 2 (two) times daily.   . modafinil (PROVIGIL) 200 MG tablet Take 200 mg by mouth daily.  . nitroGLYCERIN (NITROSTAT) 0.4 MG SL tablet Place 1 tablet (0.4 mg total) under the tongue every 5 (five) minutes as needed for chest pain.  [name] omeprazole (PRILOSEC) 20  MG capsule Take 1 capsule (20 mg total) by mouth daily.  . OXYGEN Inhale 2 L into the lungs daily as needed (short of breath).  . polyethylene glycol (MIRALAX / GLYCOLAX) packet Take 17 g by mouth 2 (two) times daily. (Patient taking differently: Take 17 g by mouth daily as needed for moderate constipation. )  . potassium chloride SA (K-DUR,KLOR-CON) 20 MEQ tablet Take 20 mEq by mouth daily.  . pregabalin (LYRICA) 50 MG capsule Take 50 mg by mouth 2 (two) times daily.  [name] torsemide (DEMADEX) 20 MG tablet Take 1 tablet (20 mg total) by mouth 2 (two) times daily.  . Vitamin E 180 MG CAPS  Take 180 mg by mouth daily.    Past Medical History:  Diagnosis Date  . Acute heart failure ([place])   . Aortic stenosis   . CHF (congestive heart failure) ([place])   . Complication of anesthesia    Hard to wake patient up after having anesthesia  . Diabetes mellitus without complication ([place])   . Diabetic neuropathy (H[facility])    Takes Lyrica  . Edema of both legs    Takes Lasix  . GERD (gastroesophageal reflux disease)   . High cholesterol   . HTN (hypertension)   . Hypertension   . PAD (peripheral artery disease) ([place])   . Shortness of breath dyspnea   . Spasm of back muscles   . Stroke ([place])   . Wound, open    Right great toe    Past Surgical History:  Procedure Laterality Date  . BYPASS GRAFT POPLITEAL TO TIBIAL Right 02/28/2016   Procedure: BYPASS GRAFT RIGHT BELOW KNEE POPLITEAL TO PERONEAL USING REVERSED RIGHT GREATER SAPHENOUS VEIN;  Surgeon: Conrad [place], MD;  Location: [facility];  Service: Vascular;  Laterality: Right;  . CYST EXCISION     Abdomen  . EYE SURGERY Bilateral    Cataract removal  . INTRAMEDULLARY (IM) NAIL INTERTROCHANTERIC Left 02/04/2014   Procedure: INTRAMEDULLARY (IM) NAIL INTERTROCHANTRIC FEMORAL;  Surgeon: Mauri Pole, MD;  Location: [facility];  Service: Orthopedics;  Laterality: Left;  . IR GENERIC HISTORICAL  03/01/2016   IR ANGIO INTRA EXTRACRAN SEL COM CAROTID INNOMINATE UNI R MOD SED 03/01/2016 [name], MD [facility]  . IR GENERIC HISTORICAL  03/01/2016   IR ENDOVASC INTRACRANIAL INF OTHER THAN THROMBO ART INC DIAG ANGIO 03/01/2016 [name], MD [facility]  . IR GENERIC HISTORICAL  03/01/2016   IR INTRAVSC STENT CERV CAROTID W/O EMB-PROT MOD SED INC ANGIO 03/01/2016 [name], MD [facility]  . IR GENERIC HISTORICAL  06/03/2016   IR RADIOLOGIST EVAL & MGMT 06/03/2016 [facility]  . LOWER EXTREMITY ANGIOGRAPHY Left 02/09/2019   Procedure: LOWER EXTREMITY ANGIOGRAPHY;  Surgeon: Katha Cabal, MD;  Location: [facility];  Service: Cardiovascular;  Laterality: Left;  . LOWER EXTREMITY ANGIOGRAPHY Left 03/10/2019   Procedure: LOWER EXTREMITY ANGIOGRAPHY;  Surgeon: Katha Cabal, MD;  Location: [facility];  Service: Cardiovascular;  Laterality: Left;  . ORIF TOE FRACTURE Right 02/08/2014   Procedure: OPEN REDUCTION INTERNAL FIXATION Right METATARSAL  FRACTURE ;  Surgeon: Wylene Simmer, MD;  Location: [facility];  Service: Orthopedics;  Laterality: Right;  . PERIPHERAL VASCULAR CATHETERIZATION N/A 12/28/2015   Procedure: Abdominal Aortogram w/Lower Extremity;  Surgeon: Conrad [place], MD;  Location: [name];  Service: Cardiovascular;  Laterality: N/A;  . RADIOLOGY WITH ANESTHESIA N/A 03/01/2016   Procedure: RADIOLOGY WITH ANESTHESIA;  Surgeon: [name], MD;  Location: [facility];  Service: Radiology;  Laterality: N/A;  . VEIN HARVEST Right 02/28/2016   Procedure: RIGHT GREATER SAPHENOUS VEIN HARVEST;  Surgeon: Conrad Dasher, MD;  Location: [facility];  Service: Vascular;  Laterality: Right;    Social History Social History   Tobacco Use  . Smoking status: Never Smoker  . Smokeless tobacco: Never Used  . Tobacco comment: Never smoked  Substance Use Topics  . Alcohol use: No    Alcohol/week: 0.0 standard drinks  . Drug use: No    Family History Family History  Problem Relation Age of Onset  . Diabetes Other   . Liver disease Mother   . CVA Father   . Diabetes Father   . Hypertension Father     Allergies  Allergen Reactions  . Iodine Anaphylaxis  . Penicillins Anaphylaxis    Tolerates ceftriaxone, cefazolin Did it involve swelling of the face/tongue/throat, SOB, or low BP? Unknown Did it involve sudden or severe rash/hives, skin peeling, or any reaction on the inside of your mouth or nose? Unknown Did you need to seek medical attention at a hospital or doctor's office? Unknown When did it last happen?Unknown If all above answers are "NO", may proceed with cephalosporin use.    . Shellfish Allergy Anaphylaxis  . Sulfa Antibiotics Anaphylaxis  . Sulfacetamide Sodium Anaphylaxis  . Sulfasalazine Anaphylaxis  . Eggs Or Egg-Derived Products     Other reaction(s): SHORTNESS OF BREATH  . Morphine And Related Other (See Comments)    Altered mental status     REVIEW OF SYSTEMS (Negative unless checked)  Constitutional: [] Weight loss  [] Fever  [] Chills Cardiac: [] Chest pain   [] Chest pressure   [] Palpitations   [] Shortness of breath when laying flat   [] Shortness of breath with exertion. Vascular:  [] Pain in legs with walking   [x] Pain in legs at rest  [] History of DVT   [] Phlebitis   [x] Swelling in legs   [] Varicose veins   [x] Non-healing ulcers Pulmonary:   [] Uses home oxygen   [] Productive cough   [] Hemoptysis   [] Wheeze  [] COPD   [] Asthma Neurologic:  [] Dizziness   [] Seizures   [] History of stroke   [] History of TIA  [] Aphasia   [] Vissual changes   [] Weakness or numbness in arm   [] Weakness or numbness in leg Musculoskeletal:   [] Joint swelling   [] Joint pain   [] Low back pain Hematologic:  [] Easy bruising  [] Easy bleeding   [] Hypercoagulable state   [] Anemic Gastrointestinal:  [] Diarrhea   [] Vomiting  [] Gastroesophageal reflux/heartburn   [] Difficulty swallowing. Genitourinary:  [] Chronic kidney disease   [] Difficult urination  [] Frequent urination   [] Blood in urine Skin:  [] Rashes   [x] Ulcers  Psychological:  [] History of anxiety   []  History of major depression.  Physical Examination  Vitals:   04/15/19 1033  BP: (!) 172/88  Pulse: 72  Resp: 18  Height: 5\' 5"  (1.651 m)   Body mass index is 29.95 kg/m. Gen: WD/WN, NAD Head: [place]/AT, No temporalis wasting.  Ear/Nose/Throat: Hearing grossly intact, nares w/o erythema or drainage Eyes: PER, EOMI, sclera nonicteric.  Neck: Supple, no large masses.   Pulmonary:  Good air movement, no audible wheezing bilaterally, no use of accessory muscles.  Cardiac: RRR, no JVD Vascular: dry gangrene right heel and left  first and third toes, no cellulitis Vessel Right Left  PT Not Palpable Not Palpable  DP Not Palpable Palpable  Gastrointestinal: Non-distended. No guarding/no peritoneal signs.  Musculoskeletal: M/S 5/5 throughout.  No deformity or  atrophy.  Neurologic: CN 2-12 intact. Symmetrical.  Speech is fluent. Motor exam as listed above. Psychiatric: Judgment intact, Mood & affect appropriate for pt's clinical situation. Dermatologic: No rashes + ulcers noted.  No changes consistent with cellulitis. Lymph : No lichenification or skin changes of chronic lymphedema.  CBC Lab Results  Component Value Date   WBC 11.8 (H) 12/26/2018   HGB 13.3 12/26/2018   HCT 40.5 12/26/2018   MCV 80.4 12/26/2018   PLT 337 12/26/2018    BMET    Component Value Date/Time   NA 135 12/28/2018 0536   K 3.0 (L) 12/28/2018 0536   CL 96 (L) 12/28/2018 0536   CO2 29 12/28/2018 0536   GLUCOSE 109 (H) 12/28/2018 0536   BUN 13 03/10/2019 0932   CREATININE 0.52 03/10/2019 0932   CALCIUM 8.6 (L) 12/28/2018 0536   GFRNONAA >60 03/10/2019 0932   GFRAA >60 03/10/2019 0932   CrCl cannot be calculated (Patient's most recent lab result is older than the maximum 21 days allowed.).  COAG Lab Results  Component Value Date   INR 1.2 12/23/2018   INR 1.07 09/16/2016   INR 1.20 02/26/2016    Radiology Vas [place] [facility] Bunting With/wo Tbi  Result Date: 03/29/2019 LOWER EXTREMITY DOPPLER STUDY Indications: Ulceration, gangrene, and peripheral artery disease. Other Factors: 03/10/2019 Left PTA popliteal and ATA.  Comparison Study: 03/01/2019 Performing Technologist: Charlane Ferretti RT (R)(VS)  Examination Guidelines: A complete evaluation includes at minimum, Doppler waveform signals and systolic blood pressure reading at the level of bilateral brachial, anterior tibial, and posterior tibial arteries, when vessel segments are accessible. Bilateral testing is considered an integral part of a complete examination. Photoelectric Plethysmograph  (PPG) waveforms and toe systolic pressure readings are included as required and additional duplex testing as needed. Limited examinations for reoccurring indications may be performed as noted.  ABI Findings: +---------+------------------+-----+----------+----------------+ Right    Rt Pressure (mmHg)IndexWaveform  Comment          +---------+------------------+-----+----------+----------------+ Brachial 205                                               +---------+------------------+-----+----------+----------------+ ATA      109               0.52 monophasic                 +---------+------------------+-----+----------+----------------+ PTA                             monophasicNon compressable +---------+------------------+-----+----------+----------------+ Great Toe29                0.14 Abnormal                   +---------+------------------+-----+----------+----------------+ +---------+------------------+-----+----------+-------+ Left     Lt Pressure (mmHg)IndexWaveform  Comment +---------+------------------+-----+----------+-------+ Brachial 209                                      +---------+------------------+-----+----------+-------+ ATA      182               0.87 monophasic        +---------+------------------+-----+----------+-------+ PTA      186  0.89 monophasic        +---------+------------------+-----+----------+-------+ Great Toe94                0.45 Dampened          +---------+------------------+-----+----------+-------+ +-------+-----------+-----------+------------+------------+ ABI/TBIToday's ABIToday's TBIPrevious ABIPrevious TBI +-------+-----------+-----------+------------+------------+ Right  .52        .14        .63         .37          +-------+-----------+-----------+------------+------------+ Left   .87        .45        .78         .18           +-------+-----------+-----------+------------+------------+ Right ABIs appear essentially unchanged compared to prior study on 03/01/2019. Left ABIs appear increased compared to prior study on 03/01/2019.  Summary: Right: Resting right ankle-brachial index indicates noncompressible right lower extremity arteries.The right toe-brachial index is abnormal. ABIs are unreliable. Right TBI appears decreased from the previous exam ob 2.27.2020. Left: Resting left ankle-brachial index indicates mild left lower extremity arterial disease. The left toe-brachial index is abnormal. Left TBI appears slightly increased from the previous exam on 10/01/2018. *See table(s) above for measurements and observations.  Electronically signed by Hortencia Pilar MD on 03/29/2019 at 4:58:49 PM.   Final     Assessment/Plan 1. 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  This represents a limb threatening ischemia and places the patient at the risk for right limb loss.  Patient should undergo angiography of the right lower extremity with the hope for intervention for limb salvage.  The risks and benefits as well as the alternative therapies was discussed in detail with the patient.  All questions were answered.  Patient agrees to proceed with right leg angiography.  The patient will follow up with me in the office after the procedure.    2. Essential hypertension Continue antihypertensive medications as already ordered, these medications have been reviewed and there are no changes at this time.   3. Type 2 diabetes mellitus with other circulatory complication, with long-term current use of insulin (HCC) Continue hypoglycemic medications as already ordered, these medications have been reviewed and there are no changes at this time.   Hgb A1C to be monitored as already arranged by primary service   4. Mixed hyperlipidemia Continue statin as ordered and reviewed, no changes at this time     Hortencia Pilar, MD  04/15/2019 10:55 AM

## 2019-04-16 ENCOUNTER — Telehealth (INDEPENDENT_AMBULATORY_CARE_PROVIDER_SITE_OTHER): Payer: Self-pay

## 2019-04-16 NOTE — Telephone Encounter (Signed)
Spoke with the patient's husband and she is now scheduled with Dr. Delana Meyer for angio on 04/27/2019. Patient will arrive at the [facility] at 8:00 am for her Covid testing then go to the [facility] at 10:00 am for her procedure. This was discussed with the husband and pre-procedure instructions will be mailed to the patient's home.

## 2019-04-19 ENCOUNTER — Telehealth: Payer: Self-pay

## 2019-04-19 NOTE — Telephone Encounter (Signed)
Spoke with patient's husband to check in and to offer to schedule a follow up visit with Palliative NP. Patient's husband is receptive to this and scheduled for Friday 04/23/2019 @ 11:am

## 2019-04-22 ENCOUNTER — Encounter (INDEPENDENT_AMBULATORY_CARE_PROVIDER_SITE_OTHER): Payer: Self-pay | Admitting: Vascular Surgery

## 2019-04-23 ENCOUNTER — Other Ambulatory Visit: Payer: Self-pay

## 2019-04-23 ENCOUNTER — Other Ambulatory Visit: Payer: Medicare HMO | Admitting: Internal Medicine

## 2019-04-23 DIAGNOSIS — Z515 Encounter for palliative care: Secondary | ICD-10-CM

## 2019-04-23 NOTE — Progress Notes (Signed)
   Sept 18th 2020 11am:   TC to home to verify [facility] visit this morning. Patient's spouse Mr. Stephen Howrey reports that patient is being followed by [facility], and he doesn't want duplication of services. For now, he wishes to discontinue [facility].  Violeta Gelinas NP-C [phone]

## 2019-04-25 ENCOUNTER — Encounter: Payer: Self-pay | Admitting: Internal Medicine

## 2019-04-26 ENCOUNTER — Other Ambulatory Visit (INDEPENDENT_AMBULATORY_CARE_PROVIDER_SITE_OTHER): Payer: Self-pay | Admitting: Nurse Practitioner

## 2019-04-27 ENCOUNTER — Encounter: Payer: Self-pay | Admitting: *Deleted

## 2019-04-27 ENCOUNTER — Other Ambulatory Visit
Admission: RE | Admit: 2019-04-27 | Discharge: 2019-04-27 | Disposition: A | Discharge status: Home / Self Care | Payer: Medicare HMO | Source: Ambulatory Visit | Attending: Vascular Surgery | Admitting: Vascular Surgery

## 2019-04-27 ENCOUNTER — Observation Stay
Admission: RE | Admit: 2019-04-27 | Discharge: 2019-04-28 | Disposition: A | Discharge status: Home / Self Care | Payer: Medicare HMO | Attending: Vascular Surgery | Admitting: Vascular Surgery

## 2019-04-27 ENCOUNTER — Other Ambulatory Visit: Payer: Self-pay

## 2019-04-27 ENCOUNTER — Encounter
Admission: RE | Disposition: A | Discharge status: Home / Self Care | Payer: Self-pay | Source: Home / Self Care | Attending: Vascular Surgery

## 2019-04-27 ENCOUNTER — Other Ambulatory Visit (INDEPENDENT_AMBULATORY_CARE_PROVIDER_SITE_OTHER): Payer: Self-pay | Admitting: Vascular Surgery

## 2019-04-27 DIAGNOSIS — Z8249 Family history of ischemic heart disease and other diseases of the circulatory system: Secondary | ICD-10-CM | POA: Insufficient documentation

## 2019-04-27 DIAGNOSIS — I509 Heart failure, unspecified: Secondary | ICD-10-CM | POA: Diagnosis not present

## 2019-04-27 DIAGNOSIS — Z7902 Long term (current) use of antithrombotics/antiplatelets: Secondary | ICD-10-CM | POA: Insufficient documentation

## 2019-04-27 DIAGNOSIS — E11621 Type 2 diabetes mellitus with foot ulcer: Secondary | ICD-10-CM | POA: Insufficient documentation

## 2019-04-27 DIAGNOSIS — Z882 Allergy status to sulfonamides status: Secondary | ICD-10-CM | POA: Insufficient documentation

## 2019-04-27 DIAGNOSIS — Z823 Family history of stroke: Secondary | ICD-10-CM | POA: Diagnosis not present

## 2019-04-27 DIAGNOSIS — Z880 Allergy status to penicillin: Secondary | ICD-10-CM | POA: Insufficient documentation

## 2019-04-27 DIAGNOSIS — Z885 Allergy status to narcotic agent status: Secondary | ICD-10-CM | POA: Diagnosis not present

## 2019-04-27 DIAGNOSIS — I70263 Atherosclerosis of native arteries of extremities with gangrene, bilateral legs: Secondary | ICD-10-CM | POA: Diagnosis not present

## 2019-04-27 DIAGNOSIS — Z79899 Other long term (current) drug therapy: Secondary | ICD-10-CM | POA: Diagnosis not present

## 2019-04-27 DIAGNOSIS — K219 Gastro-esophageal reflux disease without esophagitis: Secondary | ICD-10-CM | POA: Insufficient documentation

## 2019-04-27 DIAGNOSIS — Z833 Family history of diabetes mellitus: Secondary | ICD-10-CM | POA: Insufficient documentation

## 2019-04-27 DIAGNOSIS — L97529 Non-pressure chronic ulcer of other part of left foot with unspecified severity: Secondary | ICD-10-CM | POA: Insufficient documentation

## 2019-04-27 DIAGNOSIS — E1152 Type 2 diabetes mellitus with diabetic peripheral angiopathy with gangrene: Principal | ICD-10-CM | POA: Insufficient documentation

## 2019-04-27 DIAGNOSIS — Z7982 Long term (current) use of aspirin: Secondary | ICD-10-CM | POA: Diagnosis not present

## 2019-04-27 DIAGNOSIS — L97419 Non-pressure chronic ulcer of right heel and midfoot with unspecified severity: Secondary | ICD-10-CM | POA: Diagnosis not present

## 2019-04-27 DIAGNOSIS — E782 Mixed hyperlipidemia: Secondary | ICD-10-CM | POA: Insufficient documentation

## 2019-04-27 DIAGNOSIS — I110 Hypertensive heart disease with heart failure: Secondary | ICD-10-CM | POA: Insufficient documentation

## 2019-04-27 DIAGNOSIS — Z888 Allergy status to other drugs, medicaments and biological substances status: Secondary | ICD-10-CM | POA: Insufficient documentation

## 2019-04-27 DIAGNOSIS — I739 Peripheral vascular disease, unspecified: Secondary | ICD-10-CM | POA: Diagnosis present

## 2019-04-27 DIAGNOSIS — Z20828 Contact with and (suspected) exposure to other viral communicable diseases: Secondary | ICD-10-CM | POA: Insufficient documentation

## 2019-04-27 DIAGNOSIS — I70261 Atherosclerosis of native arteries of extremities with gangrene, right leg: Secondary | ICD-10-CM | POA: Diagnosis not present

## 2019-04-27 DIAGNOSIS — E1159 Type 2 diabetes mellitus with other circulatory complications: Secondary | ICD-10-CM | POA: Insufficient documentation

## 2019-04-27 DIAGNOSIS — Z794 Long term (current) use of insulin: Secondary | ICD-10-CM | POA: Diagnosis not present

## 2019-04-27 DIAGNOSIS — Z95828 Presence of other vascular implants and grafts: Secondary | ICD-10-CM | POA: Diagnosis not present

## 2019-04-27 DIAGNOSIS — E1140 Type 2 diabetes mellitus with diabetic neuropathy, unspecified: Secondary | ICD-10-CM | POA: Insufficient documentation

## 2019-04-27 DIAGNOSIS — Z8673 Personal history of transient ischemic attack (TIA), and cerebral infarction without residual deficits: Secondary | ICD-10-CM | POA: Diagnosis not present

## 2019-04-27 DIAGNOSIS — I350 Nonrheumatic aortic (valve) stenosis: Secondary | ICD-10-CM | POA: Diagnosis not present

## 2019-04-27 HISTORY — PX: LOWER EXTREMITY ANGIOGRAPHY: CATH118251

## 2019-04-27 LAB — CREATININE, SERUM
Creatinine, Ser: 0.55 mg/dL (ref 0.44–1.00)
GFR calc Af Amer: >60 mL/min (ref >60)
GFR calc non Af Amer: >60 mL/min (ref >60)

## 2019-04-27 LAB — GLUCOSE, CAPILLARY
Glucose-Capillary: 212 mg/dL — ABNORMAL HIGH (ref 70–99)
Glucose-Capillary: 287 mg/dL — ABNORMAL HIGH (ref 70–99)
Glucose-Capillary: 305 mg/dL — ABNORMAL HIGH (ref 70–99)
Glucose-Capillary: 213 mg/dL — ABNORMAL HIGH (ref 70–99)

## 2019-04-27 LAB — SARS CORONAVIRUS 2 BY RT PCR (HOSPITAL ORDER, PERFORMED IN ~~LOC~~ HOSPITAL LAB): SARS Coronavirus 2: NEGATIVE

## 2019-04-27 LAB — BUN: BUN: 11 mg/dL (ref 8–23)

## 2019-04-27 SURGERY — LOWER EXTREMITY ANGIOGRAPHY
Anesthesia: Moderate Sedation | Site: Leg Lower | Laterality: Left

## 2019-04-27 MED ORDER — SODIUM CHLORIDE 0.9 % IV SOLN: INTRAVENOUS | Status: DC

## 2019-04-27 MED ORDER — MIDAZOLAM HCL 2 MG/2ML IJ SOLN
INTRAMUSCULAR | Status: DC | PRN
  Administered 2019-04-27: 1 mg via INTRAVENOUS
  Administered 2019-04-27: 2 mg via INTRAVENOUS
  Administered 2019-04-27 (×2): 0.5 mg via INTRAVENOUS

## 2019-04-27 MED ORDER — DOCUSATE SODIUM 100 MG PO CAPS
100.0000 mg | ORAL_CAPSULE | Freq: Two times a day (BID) | ORAL | Status: DC
  Administered 2019-04-27 – 2019-04-28 (×2): 100 mg via ORAL
  Filled 2019-04-27 (×2): qty 1

## 2019-04-27 MED ORDER — ISOSORBIDE DINITRATE 30 MG PO TABS
30.0000 mg | ORAL_TABLET | Freq: Every day | ORAL | Status: DC
  Administered 2019-04-27 – 2019-04-28 (×2): 30 mg via ORAL
  Filled 2019-04-27 (×2): qty 1

## 2019-04-27 MED ORDER — FENTANYL CITRATE (PF) 100 MCG/2ML IJ SOLN: 12.5000 ug | Freq: Once | INTRAMUSCULAR | Status: DC | PRN

## 2019-04-27 MED ORDER — HEPARIN SODIUM (PORCINE) 5000 UNIT/ML IJ SOLN
5000.0000 [IU] | Freq: Three times a day (TID) | INTRAMUSCULAR | Status: DC
  Administered 2019-04-28 (×2): 5000 [IU] via SUBCUTANEOUS
  Filled 2019-04-27 (×2): qty 1

## 2019-04-27 MED ORDER — ASPIRIN EC 81 MG PO TBEC
81.0000 mg | DELAYED_RELEASE_TABLET | Freq: Every day | ORAL | Status: DC
  Administered 2019-04-27 – 2019-04-28 (×2): 81 mg via ORAL
  Filled 2019-04-27 (×2): qty 1

## 2019-04-27 MED ORDER — CHLORHEXIDINE GLUCONATE CLOTH 2 % EX PADS
6.0000 | MEDICATED_PAD | Freq: Every day | CUTANEOUS | Status: DC
  Administered 2019-04-28: 6 via TOPICAL

## 2019-04-27 MED ORDER — CLINDAMYCIN PHOSPHATE 300 MG/50ML IV SOLN
300.0000 mg | Freq: Once | INTRAVENOUS | Status: AC
  Administered 2019-04-27: 14:00:00 via INTRAVENOUS

## 2019-04-27 MED ORDER — HEPARIN SODIUM (PORCINE) 1000 UNIT/ML IJ SOLN
INTRAMUSCULAR | Status: AC
  Filled 2019-04-27: qty 1

## 2019-04-27 MED ORDER — METHYLPREDNISOLONE SODIUM SUCC 125 MG IJ SOLR
INTRAMUSCULAR | Status: AC
  Administered 2019-04-27: 125 mg via INTRAVENOUS
  Filled 2019-04-27: qty 2

## 2019-04-27 MED ORDER — ONDANSETRON HCL 4 MG/2ML IJ SOLN: 4.0000 mg | Freq: Four times a day (QID) | INTRAMUSCULAR | Status: DC | PRN

## 2019-04-27 MED ORDER — HYDRALAZINE HCL 25 MG PO TABS
25.0000 mg | ORAL_TABLET | Freq: Three times a day (TID) | ORAL | Status: DC
  Administered 2019-04-27 – 2019-04-28 (×2): 25 mg via ORAL
  Filled 2019-04-27 (×2): qty 1

## 2019-04-27 MED ORDER — INSULIN GLARGINE 100 UNIT/ML ~~LOC~~ SOLN
15.0000 [IU] | Freq: Every day | SUBCUTANEOUS | Status: DC
  Administered 2019-04-27: 15 [IU] via SUBCUTANEOUS
  Filled 2019-04-27 (×2): qty 0.15

## 2019-04-27 MED ORDER — ATORVASTATIN CALCIUM 20 MG PO TABS
20.0000 mg | ORAL_TABLET | Freq: Every day | ORAL | Status: DC
  Administered 2019-04-27: 20 mg via ORAL
  Filled 2019-04-27: qty 1

## 2019-04-27 MED ORDER — ACETAMINOPHEN 325 MG PO TABS: 650.0000 mg | ORAL_TABLET | Freq: Four times a day (QID) | ORAL | Status: DC | PRN

## 2019-04-27 MED ORDER — DIPHENHYDRAMINE HCL 50 MG/ML IJ SOLN
50.0000 mg | Freq: Once | INTRAMUSCULAR | Status: AC | PRN
  Administered 2019-04-27: 10:00:00 50 mg via INTRAVENOUS

## 2019-04-27 MED ORDER — HEPARIN SODIUM (PORCINE) 1000 UNIT/ML IJ SOLN
INTRAMUSCULAR | Status: DC | PRN
  Administered 2019-04-27: 5000 [IU] via INTRAVENOUS
  Administered 2019-04-27: 2000 [IU] via INTRAVENOUS

## 2019-04-27 MED ORDER — INSULIN ASPART 100 UNIT/ML ~~LOC~~ SOLN
0.0000 [IU] | Freq: Three times a day (TID) | SUBCUTANEOUS | Status: DC
  Administered 2019-04-27: 17:00:00 11 [IU] via SUBCUTANEOUS
  Administered 2019-04-28 (×2): 4 [IU] via SUBCUTANEOUS
  Filled 2019-04-27 (×3): qty 1

## 2019-04-27 MED ORDER — ALBUTEROL SULFATE (2.5 MG/3ML) 0.083% IN NEBU: 2.5000 mg | INHALATION_SOLUTION | Freq: Four times a day (QID) | RESPIRATORY_TRACT | Status: DC | PRN

## 2019-04-27 MED ORDER — PANTOPRAZOLE SODIUM 40 MG PO TBEC
40.0000 mg | DELAYED_RELEASE_TABLET | Freq: Every day | ORAL | Status: DC
  Administered 2019-04-27 – 2019-04-28 (×2): 40 mg via ORAL
  Filled 2019-04-27 (×2): qty 1

## 2019-04-27 MED ORDER — MAGNESIUM OXIDE 400 (241.3 MG) MG PO TABS
400.0000 mg | ORAL_TABLET | Freq: Two times a day (BID) | ORAL | Status: DC
  Administered 2019-04-27 – 2019-04-28 (×2): 400 mg via ORAL
  Filled 2019-04-27 (×2): qty 1

## 2019-04-27 MED ORDER — MIDAZOLAM HCL 5 MG/5ML IJ SOLN
INTRAMUSCULAR | Status: AC
  Filled 2019-04-27: qty 5

## 2019-04-27 MED ORDER — TORSEMIDE 20 MG PO TABS
20.0000 mg | ORAL_TABLET | Freq: Two times a day (BID) | ORAL | Status: DC
  Administered 2019-04-27 – 2019-04-28 (×2): 20 mg via ORAL
  Filled 2019-04-27 (×3): qty 1

## 2019-04-27 MED ORDER — CLOPIDOGREL BISULFATE 75 MG PO TABS
75.0000 mg | ORAL_TABLET | Freq: Every day | ORAL | Status: DC
  Administered 2019-04-28: 75 mg via ORAL
  Filled 2019-04-27: qty 1

## 2019-04-27 MED ORDER — DIPHENHYDRAMINE HCL 50 MG/ML IJ SOLN
INTRAMUSCULAR | Status: AC
  Administered 2019-04-27: 50 mg via INTRAVENOUS
  Filled 2019-04-27: qty 1

## 2019-04-27 MED ORDER — GABAPENTIN 300 MG PO CAPS
300.0000 mg | ORAL_CAPSULE | Freq: Every evening | ORAL | Status: DC
  Administered 2019-04-27: 300 mg via ORAL
  Filled 2019-04-27: qty 1

## 2019-04-27 MED ORDER — BUDESONIDE 0.25 MG/2ML IN SUSP
0.2500 mg | Freq: Two times a day (BID) | RESPIRATORY_TRACT | Status: DC
  Administered 2019-04-27 – 2019-04-28 (×2): 0.25 mg via RESPIRATORY_TRACT
  Filled 2019-04-27 (×2): qty 2

## 2019-04-27 MED ORDER — INSULIN ASPART 100 UNIT/ML ~~LOC~~ SOLN
SUBCUTANEOUS | Status: AC
  Administered 2019-04-27: 11 [IU] via SUBCUTANEOUS
  Filled 2019-04-27: qty 1

## 2019-04-27 MED ORDER — FAMOTIDINE 20 MG PO TABS
ORAL_TABLET | ORAL | Status: AC
  Administered 2019-04-27: 40 mg via ORAL
  Filled 2019-04-27: qty 2

## 2019-04-27 MED ORDER — FLUTICASONE PROPIONATE 50 MCG/ACT NA SUSP
1.0000 | Freq: Every day | NASAL | Status: DC | PRN
  Filled 2019-04-27: qty 16

## 2019-04-27 MED ORDER — FENTANYL CITRATE (PF) 100 MCG/2ML IJ SOLN
INTRAMUSCULAR | Status: AC
  Filled 2019-04-27: qty 2

## 2019-04-27 MED ORDER — IODIXANOL 320 MG/ML IV SOLN
INTRAVENOUS | Status: DC | PRN
  Administered 2019-04-27: 170 mL via INTRA_ARTERIAL

## 2019-04-27 MED ORDER — AMLODIPINE BESYLATE 5 MG PO TABS
10.0000 mg | ORAL_TABLET | Freq: Every day | ORAL | Status: DC
  Administered 2019-04-27 – 2019-04-28 (×2): 10 mg via ORAL
  Filled 2019-04-27 (×2): qty 2

## 2019-04-27 MED ORDER — FENTANYL CITRATE (PF) 100 MCG/2ML IJ SOLN
INTRAMUSCULAR | Status: DC | PRN
  Administered 2019-04-27 (×2): 25 ug via INTRAVENOUS
  Administered 2019-04-27 (×2): 50 ug via INTRAVENOUS

## 2019-04-27 MED ORDER — PREGABALIN 50 MG PO CAPS
50.0000 mg | ORAL_CAPSULE | Freq: Two times a day (BID) | ORAL | Status: DC
  Administered 2019-04-27 – 2019-04-28 (×2): 50 mg via ORAL
  Filled 2019-04-27 (×2): qty 1

## 2019-04-27 MED ORDER — SODIUM CHLORIDE 0.9 % IV SOLN
INTRAVENOUS | Status: DC
  Administered 2019-04-27: 10:00:00 via INTRAVENOUS

## 2019-04-27 MED ORDER — FAMOTIDINE 20 MG PO TABS
40.0000 mg | ORAL_TABLET | Freq: Once | ORAL | Status: AC | PRN
  Administered 2019-04-27: 10:00:00 40 mg via ORAL

## 2019-04-27 MED ORDER — POLYETHYLENE GLYCOL 3350 17 G PO PACK: 17.0000 g | PACK | Freq: Every day | ORAL | Status: DC | PRN

## 2019-04-27 MED ORDER — ACETAMINOPHEN 650 MG RE SUPP: 650.0000 mg | Freq: Four times a day (QID) | RECTAL | Status: DC | PRN

## 2019-04-27 MED ORDER — IPRATROPIUM-ALBUTEROL 0.5-2.5 (3) MG/3ML IN SOLN
3.0000 mL | Freq: Two times a day (BID) | RESPIRATORY_TRACT | Status: DC
  Administered 2019-04-27 – 2019-04-28 (×2): 3 mL via RESPIRATORY_TRACT
  Filled 2019-04-27 (×2): qty 3

## 2019-04-27 MED ORDER — INSULIN ASPART 100 UNIT/ML ~~LOC~~ SOLN
0.0000 [IU] | Freq: Every day | SUBCUTANEOUS | Status: DC
  Administered 2019-04-27: 2 [IU] via SUBCUTANEOUS

## 2019-04-27 MED ORDER — MIDAZOLAM HCL 2 MG/ML PO SYRP: 8.0000 mg | ORAL_SOLUTION | Freq: Once | ORAL | Status: DC | PRN

## 2019-04-27 MED ORDER — NITROGLYCERIN 0.4 MG SL SUBL: 0.4000 mg | SUBLINGUAL_TABLET | SUBLINGUAL | Status: DC | PRN

## 2019-04-27 MED ORDER — CARVEDILOL 6.25 MG PO TABS
6.2500 mg | ORAL_TABLET | Freq: Two times a day (BID) | ORAL | Status: DC
  Administered 2019-04-28: 10:00:00 6.25 mg via ORAL
  Filled 2019-04-27: qty 1

## 2019-04-27 MED ORDER — POTASSIUM CHLORIDE CRYS ER 20 MEQ PO TBCR
20.0000 meq | EXTENDED_RELEASE_TABLET | Freq: Every day | ORAL | Status: DC
  Administered 2019-04-28: 10:00:00 20 meq via ORAL
  Filled 2019-04-27: qty 1

## 2019-04-27 MED ORDER — MODAFINIL 100 MG PO TABS
200.0000 mg | ORAL_TABLET | Freq: Every day | ORAL | Status: DC
  Administered 2019-04-28: 200 mg via ORAL
  Filled 2019-04-27: qty 1
  Filled 2019-04-27: qty 2
  Filled 2019-04-27: qty 1

## 2019-04-27 MED ORDER — HYDROCODONE-ACETAMINOPHEN 5-325 MG PO TABS: 1.0000 | ORAL_TABLET | Freq: Four times a day (QID) | ORAL | Status: DC | PRN

## 2019-04-27 MED ORDER — METHYLPREDNISOLONE SODIUM SUCC 125 MG IJ SOLR
125.0000 mg | Freq: Once | INTRAMUSCULAR | Status: AC | PRN
  Administered 2019-04-27: 10:00:00 125 mg via INTRAVENOUS

## 2019-04-27 MED ORDER — CLINDAMYCIN PHOSPHATE 300 MG/50ML IV SOLN
INTRAVENOUS | Status: AC
  Administered 2019-04-27: 14:00:00 via INTRAVENOUS
  Filled 2019-04-27: qty 50

## 2019-04-27 SURGICAL SUPPLY — 37 items
BALLN COYOTE ES OTW 2X40X144 (BALLOONS) ×3
BALLN LUTONIX 018 4X150X130 (BALLOONS) ×3
BALLN LUTONIX 018 5X60X130 (BALLOONS) ×3
BALLN ULTRSCOR 014 2.5X200X150 (BALLOONS) ×3
BALLN ULTRVRSE 3X100X130C (BALLOONS) ×3
BALLOON COYOTE ES OTW 2X40X144 (BALLOONS) ×1 IMPLANT
BALLOON LUTONIX 018 4X150X130 (BALLOONS) ×1 IMPLANT
BALLOON LUTONIX 018 5X60X130 (BALLOONS) ×1 IMPLANT
BALLOON ULTRSC 014 2.5X200X150 (BALLOONS) ×1 IMPLANT
BALLOON ULTRVRSE 3X100X130C (BALLOONS) ×1 IMPLANT
CATH CROSSER 14S OTW 146CM (CATHETERS) ×3 IMPLANT
CATH PIG 70CM (CATHETERS) ×3 IMPLANT
CATH SEEKER .035X135CM (CATHETERS) ×3 IMPLANT
CATH SIDEKICK ANG 110 GEOALIGN (CATHETERS) ×3 IMPLANT
CATH VERT 5FR 125CM (CATHETERS) ×3 IMPLANT
COVER PROBE U/S 5X48 (MISCELLANEOUS) ×6 IMPLANT
DEVICE PRESTO INFLATION (MISCELLANEOUS) ×3 IMPLANT
DEVICE SAFEGUARD 24CM (GAUZE/BANDAGES/DRESSINGS) ×3 IMPLANT
DEVICE STARCLOSE SE CLOSURE (Vascular Products) ×3 IMPLANT
DEVICE TORQUE .025-.038 (MISCELLANEOUS) ×3 IMPLANT
DRAPE BRACHIAL (DRAPES) ×3 IMPLANT
ENSNARE 9-15 (MISCELLANEOUS) ×3 IMPLANT
GLIDEWIRE ADV .035X260CM (WIRE) ×3 IMPLANT
GLIDEWIRE ANGLED SS 035X260CM (WIRE) ×3 IMPLANT
NEEDLE ENTRY 21GA 7CM ECHOTIP (NEEDLE) ×3 IMPLANT
PACK ANGIOGRAPHY (CUSTOM PROCEDURE TRAY) ×3 IMPLANT
SET INTRO CAPELLA COAXIAL (SET/KITS/TRAYS/PACK) ×3 IMPLANT
SHEATH BRITE TIP 5FRX11 (SHEATH) ×3 IMPLANT
SHEATH HALO 035 5FRX10 (SHEATH) ×3 IMPLANT
SHEATH MICROPUNCTURE PEDAL 4FR (SHEATH) ×3 IMPLANT
SHEATH PINNACLE ST 7F 65CM (SHEATH) ×3 IMPLANT
SYR MEDRAD MARK 7 150ML (SYRINGE) ×3 IMPLANT
TUBING CONTRAST HIGH PRESS 72 (TUBING) ×3 IMPLANT
VALVE CHECKFLO PERFORMER (SHEATH) ×3 IMPLANT
WIRE G V18X300CM (WIRE) ×3 IMPLANT
WIRE J 3MM .035X145CM (WIRE) ×3 IMPLANT
WIRE RUNTHROUGH .014X300CM (WIRE) ×3 IMPLANT

## 2019-04-27 NOTE — Progress Notes (Signed)
Pt arrived to floor. VSS. Pt alert and oriented x3. Pt is forgetful at times. Provided pt with warm blankets per her request. NAD.

## 2019-04-27 NOTE — OR Nursing (Signed)
Incontinent urine in diaper, Purewick placed

## 2019-04-27 NOTE — OR Nursing (Signed)
Husband said the medications havent changed and the company that supplies the meds didn't provide list. He gave her her usual am meds as provided in [place] by same company. He is not sure what meds he gave. He reports son takes care of medication company

## 2019-04-27 NOTE — H&P (Signed)
[facility] History & Physical Update  The patient was interviewed and re-examined.  The patient's previous History and Physical has been reviewed and is unchanged.  There is no change in the plan of care. We plan to proceed with the scheduled procedure.  Hortencia Pilar, MD  04/27/2019, 1:17 PM

## 2019-04-27 NOTE — OR Nursing (Signed)
Family password Royce Macadamia

## 2019-04-27 NOTE — Op Note (Signed)
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  Introduction catheter into right lower extremity 3rd order catheter placement  2. Contrast injection right lower extremity for distal runoff  3.  Ultrasound-guided access to the distal right anterior tibial artery             4.   Percutaneous transluminal angioplasty right superficial femoral and popliteal arteries to 5 mm with Lutonix drug-eluting balloon 4. Crosser atherectomy with percutaneous transluminal angioplasty right anterior tibial artery to 3 to 4 mm  5. Star close closure left common femoral arteriotomy  Anesthesia: Conscious sedation was administered under my direct supervision by the interventional radiology RN. IV Versed plus fentanyl were utilized. Continuous ECG, pulse oximetry and blood pressure was monitored throughout the entire procedure.  Conscious sedation was for a total of 150 minutes.  Sheath: 7 [place] destination sheath retrograde left common femoral; 5 French micro sheath retrograde right anterior tibial  Contrast: 170 cc  Fluoroscopy Time: 22.3 minutes  Indications: [name] presents with increasing pain of the right lower extremity.  She also has an area of dry gangrene between the first and second toes.  This suggests the patient is having limb threatening ischemia. The risks and benefits are reviewed all questions answered patient agrees to proceed.  Procedure:[name] is a 78 y.o. y.o. female who was identified and appropriate procedural time out was performed. The patient was then placed supine on the table and prepped and draped in the usual sterile  fashion.   Ultrasound was placed in the sterile sleeve and the left groin was evaluated the left common femoral artery was echolucent and pulsatile indicating patency. Image was recorded for the permanent record and under real-time visualization a microneedle was inserted into the common femoral artery followed by the microwire and then the micro-sheath. A J-wire was then advanced through the micro-sheath and a 5 [place] sheath was then inserted over a J-wire. J-wire was then advanced and a 5 French pigtail catheter was positioned at the level of L3.  Pigtail catheter was repositioned to above the bifurcation and a LAO view of the pelvis was obtained. Subsequently a pigtail catheter with the stiff angle Glidewire was used to cross the aortic bifurcation the catheter wire were advanced down into the right distal external iliac artery. Oblique view of the femoral bifurcation was then obtained and subsequently the wire was reintroduced and the pigtail catheter negotiated into the SFA representing third order catheter placement. Distal runoff was then performed.  Diagnostic interpretation: The abdominal aorta was not formally imaged as a recent angiogram demonstrated it was free of hemodynamically significant stenoses.  The aortic bifurcation is widely patent and the right common and external iliac arteries although demonstrating calcific disease do not have any hemodynamically significant lesions.  The right common femoral and profunda femoris are patent diffusely diseased but without focal hemodynamically significant stenoses.  The right SFA is patent in its proximal two thirds.  At [facility] canal extending into the popliteal there are 3 greater than 70% stenoses noted.  The trifurcation is occluded including the origin of the anterior tibial as well as the tibioperoneal trunk posterior tibial and peroneal.  The anterior tibial is reconstituted approximately 1 cm from its origin and then demonstrates multiple  greater than 90% stenoses in its  proximal two thirds, the distal one third is free of hemodynamically significant stenosis however there is occlusion at the level of the dorsalis pedis.  There is a very large collateral which extends down to the medial and lateral plantar vessels and these then fill the pedal arch.  The peroneal as well as the posterior tibial are nonvisualized throughout their entirety.  Based on these findings I elected to move forward with intervention  5000 units of heparin was then given and allowed to circulate and a 7 [place] destination sheath was advanced up and over the bifurcation and positioned in the superficial femoral artery  Straight catheter and stiff angle Glidewire were then negotiated down into the distal popliteal right to the origin of the anterior tibial.  A 14 S crosser catheter was then prepped on the field and a an angled side kick catheter.  Sica catheter was negotiated down to the occluded anterior tibial and the 14 S catheter then used to cross this short segment occlusion.  Although successfully crossing this occlusion in the proximal SFA I was unable to negotiate a wire all the way distally to the ankle.  After multiple attempts with different wires I elected to perform pedal access.  Ultrasound was returned to the field in a sterile sleeve.  The anterior tibial artery was identified by ultrasound just above the level of the ankle.  1% lidocaine was infiltrated in the soft tissues.  A micropuncture needle was then used to access the anterior tibial artery.  Image was recorded for the permanent record.  Microwire followed by a micro-sheath was then inserted.  A 0.018 wire was then advanced in a retrograde fashion through the tibial and negotiated into first the popliteal and then advanced into the common femoral.  A EZ snare was then introduced into the common femoral from the femoral sheath and the 035 wire captured and pulled extracorporeally.  A balloon was  advanced in an antegrade direction down across the tibial disease and positioned with its tip in the proximal anterior tibial.  Initially this was 3 mm x 60 mm Ultraverse balloon was used to angioplasty the proximal anterior tibial at its origin as noted.  This yielded significant improvement and now allowed catheters to cross quite readily.  The 0.018 seeker catheter was then advanced down to the level of the micro puncture sheath.  The 0.035 wire was now removed completely and the micro-sheath was removed.  A 0.014 [place] wire was advanced beyond the arterial puncture site down to the distal anterior tibial.  A 2 mm x 40 mm balloon was then inflated across the puncture site.  This intervention was done after removing the micropuncture this was performed primarily to tamponade  the arterial puncture site.  Next a 2.5 mm x 30 cm ultra score balloon was advanced from the distal anterior tibial to the proximal anterior tibial to treat the greater than 90% multiple lesions.  The inflation was for 1 minute at 12 atm. Follow-up imaging demonstrated excellent patency of the mid and distal anterior tibial but the lesion at the origin was still suboptimal.  Therefore a 4 mm Lutonix balloon was advanced across the origin and inflated to 6 atm for 1 minute.  A repeat 3 mm ultra score inflation was also performed to 10 atm for 1 minute.  Follow-up imaging now demonstrated less than 20% residual stenosis throughout the entire length of the anterior tibial and attention was turned to the popliteal and distal SFA.  The detector  was then repositioned and the SFA and popliteal was reimaged multiple greater than 70% stenosis are noted in this area and after appropriate measurements are made a 4 mm x 150 mm Lutonix balloon was used to angioplasty the superficial femoral and popliteal arteries. Inflations were to 12 atmospheres for 2 minutes.  Subsequently a 5 mm x 40 mm Lutonix balloon was used to treat the distal popliteal  again inflation was to 12 atm for 1 minute.  Follow-up imaging demonstrated patency with excellent result. Distal runoff was then reassessed and noted to be widely patent.   After review of these images the sheath is pulled into the left external iliac oblique of the common femoral is obtained and a Star close device deployed. There no immediate Complications.  Findings:  The abdominal aorta was not formally imaged as a recent angiogram demonstrated it was free of hemodynamically significant stenoses.  The aortic bifurcation is widely patent and the right common and external iliac arteries although demonstrating calcific disease do not have any hemodynamically significant lesions.  The right common femoral and profunda femoris are patent diffusely diseased but without focal hemodynamically significant stenoses.  The right SFA is patent in its proximal two thirds.  At [facility] canal extending into the popliteal there are 3 greater than 70% stenoses noted.  The trifurcation is occluded including the origin of the anterior tibial as well as the tibioperoneal trunk posterior tibial and peroneal.  The anterior tibial is reconstituted approximately 1 cm from its origin and then demonstrates multiple greater than 90% stenoses in its proximal two thirds, the distal one third is free of hemodynamically significant stenosis however there is occlusion at the level of the dorsalis pedis.  There is a very large collateral which extends down to the medial and lateral plantar vessels and these then fill the pedal arch.  The peroneal as well as the posterior tibial are nonvisualized throughout their entirety.  Following crosser atherectomy with a 68 S catheter there is recanalization of the origin of the anterior tibial.  He remains problematic to advance the catheter distally and therefore pedal access is obtained as above and the wire is subsequently captured with a snare.  This now provides a stable rail throughout the  entire SFA and anterior tibial. Following angioplasty anterior tibial now is in-line flow and looks quite nice with less than 20% residual stenosis at its origin and less than 10% residual stenosis throughout its course. Angioplasty of the SFA at [place] canal extending down to the length of the popliteal for multiple tandem lesions treatment yields an excellent result with less than 10% residual stenosis.  Summary: Successful recanalization right lower extremity for limb salvage   Disposition: Patient was taken to the recovery room in stable condition having tolerated the procedure well.  Belenda Cruise  04/27/2019,6:36 PM

## 2019-04-28 ENCOUNTER — Telehealth: Payer: Self-pay

## 2019-04-28 ENCOUNTER — Encounter: Payer: Self-pay | Admitting: Vascular Surgery

## 2019-04-28 DIAGNOSIS — I70261 Atherosclerosis of native arteries of extremities with gangrene, right leg: Secondary | ICD-10-CM

## 2019-04-28 DIAGNOSIS — E1152 Type 2 diabetes mellitus with diabetic peripheral angiopathy with gangrene: Secondary | ICD-10-CM | POA: Diagnosis not present

## 2019-04-28 LAB — MAGNESIUM: Magnesium: 2.1 mg/dL (ref 1.7–2.4)

## 2019-04-28 LAB — CBC
HCT: 32.9 % — ABNORMAL LOW (ref 36.0–46.0)
Hemoglobin: 10.3 g/dL — ABNORMAL LOW (ref 12.0–15.0)
MCH: 25.8 pg — ABNORMAL LOW (ref 26.0–34.0)
MCHC: 31.3 g/dL (ref 30.0–36.0)
MCV: 82.5 fL (ref 80.0–100.0)
Platelets: 241 10*3/uL (ref 150–400)
RBC: 3.99 MIL/uL (ref 3.87–5.11)
RDW: 14.6 % (ref 11.5–15.5)
WBC: 12.1 10*3/uL — ABNORMAL HIGH (ref 4.0–10.5)
nRBC: 0 % (ref 0.0–0.2)

## 2019-04-28 LAB — GLUCOSE, CAPILLARY
Glucose-Capillary: 185 mg/dL — ABNORMAL HIGH (ref 70–99)
Glucose-Capillary: 150 mg/dL — ABNORMAL HIGH (ref 70–99)

## 2019-04-28 LAB — BASIC METABOLIC PANEL
Anion gap: 7 (ref 5–15)
BUN: 14 mg/dL (ref 8–23)
CO2: 26 mmol/L (ref 22–32)
Calcium: 9.1 mg/dL (ref 8.9–10.3)
Chloride: 106 mmol/L (ref 98–111)
Creatinine, Ser: 0.67 mg/dL (ref 0.44–1.00)
GFR calc Af Amer: >60 mL/min (ref >60)
GFR calc non Af Amer: >60 mL/min (ref >60)
Glucose, Bld: 241 mg/dL — ABNORMAL HIGH (ref 70–99)
Potassium: 3.6 mmol/L (ref 3.5–5.1)
Sodium: 139 mmol/L (ref 135–145)

## 2019-04-28 MED ORDER — ASPIRIN 81 MG PO TBEC: 81.0000 mg | DELAYED_RELEASE_TABLET | Freq: Every day | ORAL | Status: DC

## 2019-04-28 NOTE — TOC Transition Note (Signed)
Transition of Care [facility]) - CM/SW Discharge Note   Patient Details  Name: [name] MRN: [id number] Date of Birth: 1941-06-06  Transition [facility] CM/SW Contact:  Beverly Sessions, RN Phone Number: 04/28/2019, 1:55 PM   Clinical Narrative:    Patient to discharge today  Assessment completed with husband via phone.  States that patient is bedbound.  Uses lift to transfer her to [facility]  PCP Ter[place]  Husband states that [employer] is who takes the patient to all of her appointments and who will be picking her up today.  Bedside RN to call [phone]  States that patient has hospital bed, lift, O2, BSC, shower seat, trilogy, WC and RW in the home  Patient open with home health through [place].  Corene Cornea with [place] aware of appointment  Patient followed by [facility].    Final next level of care: [place] Barriers to Discharge: No Barriers Identified   Patient Goals and CMS Choice        Discharge Placement                  Name of family member notified: Husband Patient and family notified of of transfer: 04/28/19  Discharge Plan and Services                          HH Arranged: RN [facility] Agency: [place] ([place]) Date [place]: 04/28/19 Time [place]: 1354 Representative spoke with at [place]: [place] ([place]) Interventions     Readmission Risk Interventions Readmission Risk Prevention Plan 12/24/2018  [place] or Home Care Consult Complete  Some recent data might be hidden

## 2019-04-28 NOTE — Progress Notes (Signed)
[facility]    Discharge Summary  Patient ID:  [name] MRN: [id number] DOB/AGE: [birth year] 78 y.o.  Admit date: 04/27/2019 Discharge date: 04/28/2019 Date of Surgery: 04/27/2019 Surgeon: Surgeon(s): Schnier, Dolores Lory, MD  Admission Diagnosis: PAD (peripheral artery disease) [facility]) [I73.9]  Discharge Diagnoses:  PAD (peripheral artery disease) ([place]) [I73.9]  Secondary Diagnoses: Past Medical History:  Diagnosis Date  . Acute heart failure ([place])   . Aortic stenosis   . CHF (congestive heart failure) ([place])   . Complication of anesthesia    Hard to wake patient up after having anesthesia  . Diabetes mellitus without complication ([place])   . Diabetic neuropathy (H[facility])    Takes Lyrica  . Edema of both legs    Takes Lasix  . GERD (gastroesophageal reflux disease)   . High cholesterol   . HTN (hypertension)   . Hypertension   . PAD (peripheral artery disease) ([place])   . Shortness of breath dyspnea   . Spasm of back muscles   . Stroke ([place])   . Wound, open    Right great toe   Procedure(s): LOWER EXTREMITY ANGIOGRAPHY  Discharged Condition: good  HPI / Hospital Course:  [name] is a 78 y.o. female is S/P Right Procedure(s): LOWER EXTREMITY ANGIOGRAPHY  The patient is a 78 year old female with multiple medical issues including peripheral artery disease. [name] presented with increasing pain of the right lower extremity.  She also has an area of dry gangrene between the first and second toes.  This suggests the patient is having limb threatening ischemia.  On April 27, 2019 the patient underwent:  1.  Introduction catheter into right lower extremity 3rd order catheter placement  2. Contrast injection right lower extremity for distal runoff  3.  Ultrasound-guided access to the distal right anterior tibial artery             4.   Percutaneous transluminal angioplasty right superficial femoral  and popliteal arteries to 5 mm with Lutonix drug-eluting balloon 4. Crosser atherectomy with percutaneous transluminal angioplasty right anterior tibial artery to 3 to 4 mm  5. Star close closure left common femoral arteriotomy  She tolerated the procedure well and was transferred to the surgical floor without issue.  The patient was kept overnight for observation.  No issues night of procedure.  Postop day #1 /day of discharge, the patient was tolerating regular diet, urinating independently, her pain was controlled with the use of p.o. pain medication and she was ambulating at baseline.  The patient's vital signs are stable and she was afebrile.  Physical exam:  A&Ox3, NAD CV: RRR Pulm: CTA Bilaterally Abdomen: Soft, nontender, nondistended, positive bowel sounds Left groin: Access site dressing removed -clean and dry, without swelling. Right calf access site: Twice daily removed.  No active bleeding noted.  No swelling. Right lower extremity: Thigh soft, calf soft.  Extremities warm distally to the toes.  Hard to palpate pedal pulses due to edema however the foot is warm.  Labs as below  Complications:none  Consults: None  Significant Diagnostic Studies: CBC Lab Results  Component Value Date   WBC 12.1 (H) 04/28/2019   HGB 10.3 (L) 04/28/2019   HCT 32.9 (L) 04/28/2019   MCV 82.5 04/28/2019   PLT 241 04/28/2019   BMET    Component Value Date/Time   NA 139 04/28/2019 0425   K 3.6 04/28/2019 0425   CL 106 04/28/2019 0425   CO2 26 04/28/2019 0425  GLUCOSE 241 (H) 04/28/2019 0425   BUN 14 04/28/2019 0425   CREATININE 0.67 04/28/2019 0425   CALCIUM 9.1 04/28/2019 0425   GFRNONAA >60 04/28/2019 0425   GFRAA >60 04/28/2019 0425   COAG Lab Results  Component Value Date   INR 1.2 12/23/2018   INR 1.07 09/16/2016   INR 1.20 02/26/2016   Disposition:  Discharge to :Home  Allergies as of 04/28/2019      Reactions   Iodine Anaphylaxis    Penicillins Anaphylaxis   Tolerates ceftriaxone, cefazolin Did it involve swelling of the face/tongue/throat, SOB, or low BP? Unknown Did it involve sudden or severe rash/hives, skin peeling, or any reaction on the inside of your mouth or nose? Unknown Did you need to seek medical attention at a hospital or doctor's office? Unknown When did it last happen?Unknown If all above answers are "NO", may proceed with cephalosporin use.   Shellfish Allergy Anaphylaxis   Sulfa Antibiotics Anaphylaxis   Sulfacetamide Sodium Anaphylaxis   Sulfasalazine Anaphylaxis   Eggs Or Egg-derived Products    Other reaction(s): SHORTNESS OF BREATH   Morphine And Related Other (See Comments)   Altered mental status      Medication List    TAKE these medications   acetaminophen 500 MG tablet Commonly known as: TYLENOL Take 1,000 mg by mouth every 6 (six) hours as needed for mild pain.   albuterol 108 (90 Base) MCG/ACT inhaler Commonly known as: VENTOLIN HFA Inhale 2 puffs into the lungs every 6 (six) hours as needed for wheezing or shortness of breath.   amLODipine 10 MG tablet Commonly known as: NORVASC Take 10 mg by mouth daily.   aspirin 81 MG EC tablet Take 1 tablet (81 mg total) by mouth daily. Start taking on: April 29, 2019   atorvastatin 20 MG tablet Commonly known as: LIPITOR Take 1 tablet (20 mg total) by mouth daily. What changed: when to take this   budesonide 0.25 MG/2ML nebulizer solution Commonly known as: PULMICORT Take 2 mLs (0.25 mg total) by nebulization 2 (two) times daily.   carvedilol 6.25 MG tablet Commonly known as: COREG Take 6.25 mg by mouth 2 (two) times daily with a meal.   cetirizine 10 MG tablet Commonly known as: ZYRTEC Take 10 mg by mouth daily.   clopidogrel 75 MG tablet Commonly known as: PLAVIX Take 1 tablet (75 mg total) by mouth daily with breakfast.   docusate sodium 100 MG capsule Commonly known as: COLACE Take 100 mg by mouth 2 (two)  times daily.   fluticasone 50 MCG/ACT nasal spray Commonly known as: FLONASE Place 1 spray into both nostrils daily as needed for allergies.   gabapentin 300 MG capsule Commonly known as: NEURONTIN Take 300 mg by mouth every evening.   hydrALAZINE 25 MG tablet Commonly known as: APRESOLINE Take 1 tablet (25 mg total) by mouth every 8 (eight) hours.   insulin glargine 100 UNIT/ML injection Commonly known as: LANTUS Inject 0.15 mLs (15 Units total) into the skin at bedtime.   ipratropium-albuterol 0.5-2.5 (3) MG/3ML Soln Commonly known as: DUONEB Take 3 mLs by nebulization 2 (two) times daily.   isosorbide dinitrate 30 MG tablet Commonly known as: ISORDIL Take 30 mg by mouth daily.   magnesium oxide 400 MG tablet Commonly known as: MAG-OX Take 400 mg by mouth 2 (two) times daily.   modafinil 200 MG tablet Commonly known as: PROVIGIL Take 200 mg by mouth daily.   nitroGLYCERIN 0.4 MG SL tablet Commonly known as:  NITROSTAT Place 1 tablet (0.4 mg total) under the tongue every 5 (five) minutes as needed for chest pain.   omeprazole 20 MG capsule Commonly known as: PRILOSEC Take 1 capsule (20 mg total) by mouth daily.   OXYGEN Inhale 2 L into the lungs daily as needed (short of breath).   polyethylene glycol 17 g packet Commonly known as: MIRALAX / GLYCOLAX Take 17 g by mouth 2 (two) times daily. What changed:   when to take this  reasons to take this   potassium chloride SA 20 MEQ tablet Commonly known as: K-DUR Take 20 mEq by mouth daily.   pregabalin 50 MG capsule Commonly known as: LYRICA Take 50 mg by mouth 2 (two) times daily.   torsemide 20 MG tablet Commonly known as: DEMADEX Take 1 tablet (20 mg total) by mouth 2 (two) times daily.   Vitamin D 50 MCG (2000 UT) Caps Take 2,000 Units by mouth daily.   Vitamin E 180 MG Caps Take 180 mg by mouth daily.      Verbal and written Discharge instructions given to the patient. Wound care per Discharge  AVS Follow-up Information    Schnier, Dolores Lory, MD Follow up in 1 month(s).   Specialties: Vascular Surgery, Cardiology, Radiology, Vascular Surgery Why: Can see Schnier or Arna Medici. Will need ABI's.  Contact information: [place] [address] [phone]          Signed: Sela Hua, PA-C  04/28/2019, 1:19 PM

## 2019-04-28 NOTE — Discharge Instructions (Signed)
You may shower as of Friday.  Please keep groins clean and dry.

## 2019-04-28 NOTE — Progress Notes (Signed)
04/28/2019 4:37 PM  [name] to be D/C'd Home per MD order.  Discussed prescriptions and follow up appointments with the patient. Prescriptions given to patient, medication list explained in detail. Pt verbalized understanding.  Allergies as of 04/28/2019      Reactions   Iodine Anaphylaxis   Penicillins Anaphylaxis   Tolerates ceftriaxone, cefazolin Did it involve swelling of the face/tongue/throat, SOB, or low BP? Unknown Did it involve sudden or severe rash/hives, skin peeling, or any reaction on the inside of your mouth or nose? Unknown Did you need to seek medical attention at a hospital or doctor's office? Unknown When did it last happen?Unknown If all above answers are "NO", may proceed with cephalosporin use.   Shellfish Allergy Anaphylaxis   Sulfa Antibiotics Anaphylaxis   Sulfacetamide Sodium Anaphylaxis   Sulfasalazine Anaphylaxis   Eggs Or Egg-derived Products    Other reaction(s): SHORTNESS OF BREATH   Morphine And Related Other (See Comments)   Altered mental status      Medication List    TAKE these medications   acetaminophen 500 MG tablet Commonly known as: TYLENOL Take 1,000 mg by mouth every 6 (six) hours as needed for mild pain.   albuterol 108 (90 Base) MCG/ACT inhaler Commonly known as: VENTOLIN HFA Inhale 2 puffs into the lungs every 6 (six) hours as needed for wheezing or shortness of breath.   amLODipine 10 MG tablet Commonly known as: NORVASC Take 10 mg by mouth daily.   aspirin 81 MG EC tablet Take 1 tablet (81 mg total) by mouth daily. Start taking on: April 29, 2019   atorvastatin 20 MG tablet Commonly known as: LIPITOR Take 1 tablet (20 mg total) by mouth daily. What changed: when to take this   budesonide 0.25 MG/2ML nebulizer solution Commonly known as: PULMICORT Take 2 mLs (0.25 mg total) by nebulization 2 (two) times daily.   carvedilol 6.25 MG tablet Commonly known as: COREG Take 6.25 mg by mouth 2 (two) times  daily with a meal.   cetirizine 10 MG tablet Commonly known as: ZYRTEC Take 10 mg by mouth daily.   clopidogrel 75 MG tablet Commonly known as: PLAVIX Take 1 tablet (75 mg total) by mouth daily with breakfast.   docusate sodium 100 MG capsule Commonly known as: COLACE Take 100 mg by mouth 2 (two) times daily.   fluticasone 50 MCG/ACT nasal spray Commonly known as: FLONASE Place 1 spray into both nostrils daily as needed for allergies.   gabapentin 300 MG capsule Commonly known as: NEURONTIN Take 300 mg by mouth every evening.   hydrALAZINE 25 MG tablet Commonly known as: APRESOLINE Take 1 tablet (25 mg total) by mouth every 8 (eight) hours.   insulin glargine 100 UNIT/ML injection Commonly known as: LANTUS Inject 0.15 mLs (15 Units total) into the skin at bedtime.   ipratropium-albuterol 0.5-2.5 (3) MG/3ML Soln Commonly known as: DUONEB Take 3 mLs by nebulization 2 (two) times daily.   isosorbide dinitrate 30 MG tablet Commonly known as: ISORDIL Take 30 mg by mouth daily.   magnesium oxide 400 MG tablet Commonly known as: MAG-OX Take 400 mg by mouth 2 (two) times daily.   modafinil 200 MG tablet Commonly known as: PROVIGIL Take 200 mg by mouth daily.   nitroGLYCERIN 0.4 MG SL tablet Commonly known as: NITROSTAT Place 1 tablet (0.4 mg total) under the tongue every 5 (five) minutes as needed for chest pain.   omeprazole 20 MG capsule Commonly known as: PRILOSEC Take 1  capsule (20 mg total) by mouth daily.   OXYGEN Inhale 2 L into the lungs daily as needed (short of breath).   polyethylene glycol 17 g packet Commonly known as: MIRALAX / GLYCOLAX Take 17 g by mouth 2 (two) times daily. What changed:   when to take this  reasons to take this   potassium chloride SA 20 MEQ tablet Commonly known as: K-DUR Take 20 mEq by mouth daily.   pregabalin 50 MG capsule Commonly known as: LYRICA Take 50 mg by mouth 2 (two) times daily.   torsemide 20 MG  tablet Commonly known as: DEMADEX Take 1 tablet (20 mg total) by mouth 2 (two) times daily.   Vitamin D 50 MCG (2000 UT) Caps Take 2,000 Units by mouth daily.   Vitamin E 180 MG Caps Take 180 mg by mouth daily.       Vitals:   04/28/19 1242 04/28/19 1607  BP: (!) 114/54 135/77  Pulse: 71 67  Resp: 15 16  Temp: 97.9 F (36.6 C) 98.7 F (37.1 C)  SpO2: 98% 96%    Skin clean, dry and intact without evidence of skin break down, no evidence of skin tears noted. IV catheter discontinued intact. Site without signs and symptoms of complications. Dressing and pressure applied. Pt denies pain at this time. No complaints noted.  An After Visit Summary was printed and given to the patient. Patient escorted and D/C via [facility] [place]  [place]

## 2019-04-28 NOTE — Care Management Obs Status (Signed)
[facility] NOTIFICATION   Patient Details  Name: MICHON VLAHAKIS MRN: [id number] Date of Birth: 05-16-1941   Medicare Observation Status Notification Given:  No(admitted obs less thand 24 hours)    Beverly Sessions, RN 04/28/2019, 1:24 PM

## 2019-04-29 NOTE — Telephone Encounter (Signed)
Phone call placed to [facility] to verify that patient is being followed by Palliative Care through [facility].    [facility] does not offer Palliative care but instead collaborates with community Palliative care. Primary Palliative NP made aware.

## 2019-04-29 NOTE — Discharge Summary (Signed)
[facility]    Discharge Summary  Patient ID:  [name] MRN: [id number] DOB/AGE: [birth year] 78 y.o.  Admit date: 04/27/2019 Discharge date: 04/28/2019 Date of Surgery: 04/27/2019 Surgeon: Surgeon(s): Schnier, Dolores Lory, MD  Admission Diagnosis: PAD (peripheral artery disease) [facility]) [I73.9]  Discharge Diagnoses:  PAD (peripheral artery disease) ([place]) [I73.9]  Secondary Diagnoses:     Past Medical History:  Diagnosis Date  . Acute heart failure ([place])   . Aortic stenosis   . CHF (congestive heart failure) ([place])   . Complication of anesthesia    Hard to wake patient up after having anesthesia  . Diabetes mellitus without complication ([place])   . Diabetic neuropathy ([facility])    Takes Lyrica  . Edema of both legs    Takes Lasix  . GERD (gastroesophageal reflux disease)   . High cholesterol   . HTN (hypertension)   . Hypertension   . PAD (peripheral artery disease) ([place])   . Shortness of breath dyspnea   . Spasm of back muscles   . Stroke ([place])   . Wound, open    Right great toe   Procedure(s): LOWER EXTREMITY ANGIOGRAPHY  Discharged Condition: good  HPI / Hospital Course:  [name] is a 78 y.o. female is S/P Right Procedure(s): LOWER EXTREMITY ANGIOGRAPHY  The patient is a 78 year old female with multiple medical issues including peripheral artery disease. [name] Huntpresented with increasing pain of the rightlower extremity. She also has an area of dry gangrene between the first and second toes. This suggests the patient is having limb threatening ischemia.  On April 27, 2019 the patient underwent:  1. Introduction catheter intorightlower extremity 3rd order catheter placement  2.Contrast injectionrightlower extremity for distal runoff  3. Ultrasound-guided access to thedistal right anterior tibialartery 4.Percutaneous transluminal  angioplasty rightsuperficial femoral and popliteal arteries to 5 mm with Lutonix drug-eluting balloon 4. Crosser atherectomy with percutaneous transluminal angioplastyright anterior tibial artery to 3 to 4 mm  5. Star close closure leftcommon femoral arteriotomy  She tolerated the procedure well and was transferred to the surgical floor without issue.  The patient was kept overnight for observation.  No issues night of procedure.  Postop day #1 /day of discharge, the patient was tolerating regular diet, urinating independently, her pain was controlled with the use of p.o. pain medication and she was ambulating at baseline.  The patient's vital signs are stable and she was afebrile.  Physical exam:  A&Ox3, NAD CV: RRR Pulm: CTA Bilaterally Abdomen: Soft, nontender, nondistended, positive bowel sounds Left groin: Access site dressing removed -clean and dry, without swelling. Right calf access site: Twice daily removed.  No active bleeding noted.  No swelling. Right lower extremity: Thigh soft, calf soft.  Extremities warm distally to the toes.  Hard to palpate pedal pulses due to edema however the foot is warm.  Labs as below  Complications:none  Consults: None  Significant Diagnostic Studies: CBC Recent Labs       Lab Results  Component Value Date   WBC 12.1 (H) 04/28/2019   HGB 10.3 (L) 04/28/2019   HCT 32.9 (L) 04/28/2019   MCV 82.5 04/28/2019   PLT 241 04/28/2019     BMET Labs (Brief)          Component Value Date/Time   NA 139 04/28/2019 0425   K 3.6 04/28/2019 0425   CL 106 04/28/2019 0425   CO2 26 04/28/2019 0425   GLUCOSE 241 (H) 04/28/2019 0425   BUN  14 04/28/2019 0425   CREATININE 0.67 04/28/2019 0425   CALCIUM 9.1 04/28/2019 0425   GFRNONAA >60 04/28/2019 0425   GFRAA >60 04/28/2019 0425     COAG Recent Labs       Lab Results  Component Value Date   INR 1.2 12/23/2018   INR 1.07 09/16/2016   INR  1.20 02/26/2016     Disposition:  Discharge to :Home       Allergies as of 04/28/2019      Reactions   Iodine Anaphylaxis   Penicillins Anaphylaxis   Tolerates ceftriaxone, cefazolin Did it involve swelling of the face/tongue/throat, SOB, or low BP? Unknown Did it involve sudden or severe rash/hives, skin peeling, or any reaction on the inside of your mouth or nose? Unknown Did you need to seek medical attention at a hospital or doctor's office? Unknown When did it last happen?Unknown If all above answers are "NO", may proceed with cephalosporin use.   Shellfish Allergy Anaphylaxis   Sulfa Antibiotics Anaphylaxis   Sulfacetamide Sodium Anaphylaxis   Sulfasalazine Anaphylaxis   Eggs Or Egg-derived Products    Other reaction(s): SHORTNESS OF BREATH   Morphine And Related Other (See Comments)   Altered mental status         Medication List    TAKE these medications   acetaminophen 500 MG tablet Commonly known as: TYLENOL Take 1,000 mg by mouth every 6 (six) hours as needed for mild pain.   albuterol 108 (90 Base) MCG/ACT inhaler Commonly known as: VENTOLIN HFA Inhale 2 puffs into the lungs every 6 (six) hours as needed for wheezing or shortness of breath.   amLODipine 10 MG tablet Commonly known as: NORVASC Take 10 mg by mouth daily.   aspirin 81 MG EC tablet Take 1 tablet (81 mg total) by mouth daily. Start taking on: April 29, 2019   atorvastatin 20 MG tablet Commonly known as: LIPITOR Take 1 tablet (20 mg total) by mouth daily. What changed: when to take this   budesonide 0.25 MG/2ML nebulizer solution Commonly known as: PULMICORT Take 2 mLs (0.25 mg total) by nebulization 2 (two) times daily.   carvedilol 6.25 MG tablet Commonly known as: COREG Take 6.25 mg by mouth 2 (two) times daily with a meal.   cetirizine 10 MG tablet Commonly known as: ZYRTEC Take 10 mg by mouth daily.   clopidogrel 75 MG tablet Commonly known  as: PLAVIX Take 1 tablet (75 mg total) by mouth daily with breakfast.   docusate sodium 100 MG capsule Commonly known as: COLACE Take 100 mg by mouth 2 (two) times daily.   fluticasone 50 MCG/ACT nasal spray Commonly known as: FLONASE Place 1 spray into both nostrils daily as needed for allergies.   gabapentin 300 MG capsule Commonly known as: NEURONTIN Take 300 mg by mouth every evening.   hydrALAZINE 25 MG tablet Commonly known as: APRESOLINE Take 1 tablet (25 mg total) by mouth every 8 (eight) hours.   insulin glargine 100 UNIT/ML injection Commonly known as: LANTUS Inject 0.15 mLs (15 Units total) into the skin at bedtime.   ipratropium-albuterol 0.5-2.5 (3) MG/3ML Soln Commonly known as: DUONEB Take 3 mLs by nebulization 2 (two) times daily.   isosorbide dinitrate 30 MG tablet Commonly known as: ISORDIL Take 30 mg by mouth daily.   magnesium oxide 400 MG tablet Commonly known as: MAG-OX Take 400 mg by mouth 2 (two) times daily.   modafinil 200 MG tablet Commonly known as: PROVIGIL Take 200 mg by  mouth daily.   nitroGLYCERIN 0.4 MG SL tablet Commonly known as: NITROSTAT Place 1 tablet (0.4 mg total) under the tongue every 5 (five) minutes as needed for chest pain.   omeprazole 20 MG capsule Commonly known as: PRILOSEC Take 1 capsule (20 mg total) by mouth daily.   OXYGEN Inhale 2 L into the lungs daily as needed (short of breath).   polyethylene glycol 17 g packet Commonly known as: MIRALAX / GLYCOLAX Take 17 g by mouth 2 (two) times daily. What changed:   when to take this  reasons to take this   potassium chloride SA 20 MEQ tablet Commonly known as: K-DUR Take 20 mEq by mouth daily.   pregabalin 50 MG capsule Commonly known as: LYRICA Take 50 mg by mouth 2 (two) times daily.   torsemide 20 MG tablet Commonly known as: DEMADEX Take 1 tablet (20 mg total) by mouth 2 (two) times daily.   Vitamin D 50 MCG (2000 UT) Caps Take  2,000 Units by mouth daily.   Vitamin E 180 MG Caps Take 180 mg by mouth daily.      Verbal and written Discharge instructions given to the patient. Wound care per Discharge AVS    Follow-up Information    Schnier, Dolores Lory, MD Follow up in 1 month(s).   Specialties: Vascular Surgery, Cardiology, Radiology, Vascular Surgery Why: Can see Schnier or Arna Medici. Will need ABI's.  Contact information: [place] [id number] [phone]          Signed: Sela Hua, PA-C  04/28/2019, 1:19 PM          Electronically signed by Sela Hua, PA-C at 04/28/2019 1:19 PM

## 2019-05-24 ENCOUNTER — Other Ambulatory Visit (INDEPENDENT_AMBULATORY_CARE_PROVIDER_SITE_OTHER): Payer: Self-pay | Admitting: Vascular Surgery

## 2019-05-24 DIAGNOSIS — I70261 Atherosclerosis of native arteries of extremities with gangrene, right leg: Secondary | ICD-10-CM

## 2019-05-24 DIAGNOSIS — Z9862 Peripheral vascular angioplasty status: Secondary | ICD-10-CM

## 2019-05-31 ENCOUNTER — Telehealth (INDEPENDENT_AMBULATORY_CARE_PROVIDER_SITE_OTHER): Payer: Self-pay

## 2019-05-31 ENCOUNTER — Ambulatory Visit (INDEPENDENT_AMBULATORY_CARE_PROVIDER_SITE_OTHER): Payer: Medicare HMO | Admitting: Vascular Surgery

## 2019-05-31 ENCOUNTER — Other Ambulatory Visit: Payer: Self-pay

## 2019-05-31 ENCOUNTER — Ambulatory Visit (INDEPENDENT_AMBULATORY_CARE_PROVIDER_SITE_OTHER): Payer: Medicare HMO

## 2019-05-31 ENCOUNTER — Encounter (INDEPENDENT_AMBULATORY_CARE_PROVIDER_SITE_OTHER): Payer: Self-pay | Admitting: Vascular Surgery

## 2019-05-31 VITALS — BP 142/82 | HR 66 | Resp 18

## 2019-05-31 DIAGNOSIS — I10 Essential (primary) hypertension: Secondary | ICD-10-CM

## 2019-05-31 DIAGNOSIS — Z9862 Peripheral vascular angioplasty status: Secondary | ICD-10-CM

## 2019-05-31 DIAGNOSIS — I70261 Atherosclerosis of native arteries of extremities with gangrene, right leg: Secondary | ICD-10-CM | POA: Diagnosis not present

## 2019-05-31 DIAGNOSIS — Z794 Long term (current) use of insulin: Secondary | ICD-10-CM

## 2019-05-31 DIAGNOSIS — I70263 Atherosclerosis of native arteries of extremities with gangrene, bilateral legs: Secondary | ICD-10-CM | POA: Diagnosis not present

## 2019-05-31 DIAGNOSIS — E782 Mixed hyperlipidemia: Secondary | ICD-10-CM | POA: Diagnosis not present

## 2019-05-31 DIAGNOSIS — E1159 Type 2 diabetes mellitus with other circulatory complications: Secondary | ICD-10-CM

## 2019-05-31 NOTE — Progress Notes (Signed)
MRN : [id number]  JAYELLE CAVENDISH is a 78 y.o. (Dec 06, 1940) female who presents with chief complaint of  Chief Complaint  Patient presents with  . Follow-up    ultrasound follow up  .  History of Present Illness:  The patient returns to the office for followup and review status post angiogram with intervention on 04/27/2019.   The right leg was addressed at this intervention:  Pedal stick was made and crosser atherectomy with angioplasty was performed of the right AT angioplasty of the SFA was also done.  The left leg was intervened upon 02/09/2019.  The patient's husband notes there is now pus around the left third toe.  He states the toe is much worse. They both indicate there is mild left rest pain symptoms.  No new ulcers or wounds have occurred since the last visit.  There have been no significant changes to the patient's overall health care.  There are no recent neurological changes noted. The patient denies history of DVT, PE or superficial thrombophlebitis. The patient denies recent episodes of angina or shortness of breath.   ABI's Rt=0.69 and Lt=0.47  (previous ABI's Rt=0.52 and Lt=0.87)   Current Meds  Medication Sig  . acetaminophen (TYLENOL) 500 MG tablet Take 1,000 mg by mouth every 6 (six) hours as needed for mild pain.  [name] albuterol (PROVENTIL HFA;VENTOLIN HFA) 108 (90 Base) MCG/ACT inhaler Inhale 2 puffs into the lungs every 6 (six) hours as needed for wheezing or shortness of breath.   [name] amLODipine (NORVASC) 10 MG tablet Take 10 mg by mouth daily.  [name] aspirin EC 81 MG EC tablet Take 1 tablet (81 mg total) by mouth daily.  [name] atorvastatin (LIPITOR) 20 MG tablet Take 1 tablet (20 mg total) by mouth daily. (Patient taking differently: Take 20 mg by mouth at bedtime. )  . budesonide (PULMICORT) 0.25 MG/2ML nebulizer solution Take 2 mLs (0.25 mg total) by nebulization 2 (two) times daily.  . carvedilol (COREG) 6.25 MG tablet Take 6.25 mg by mouth 2 (two) times daily with a  meal.  . cetirizine (ZYRTEC) 10 MG tablet Take 10 mg by mouth daily.   . Cholecalciferol (VITAMIN D) 50 MCG (2000 UT) CAPS Take 2,000 Units by mouth daily.   . clopidogrel (PLAVIX) 75 MG tablet Take 1 tablet (75 mg total) by mouth daily with breakfast.  . docusate sodium (COLACE) 100 MG capsule Take 100 mg by mouth 2 (two) times daily.  . fluticasone (FLONASE) 50 MCG/ACT nasal spray Place 1 spray into both nostrils daily as needed for allergies.   [name] gabapentin (NEURONTIN) 300 MG capsule Take 300 mg by mouth every evening.   . hydrALAZINE (APRESOLINE) 25 MG tablet Take 1 tablet (25 mg total) by mouth every 8 (eight) hours.  . insulin glargine (LANTUS) 100 UNIT/ML injection Inject 0.15 mLs (15 Units total) into the skin at bedtime.  [name] ipratropium-albuterol (DUONEB) 0.5-2.5 (3) MG/3ML SOLN Take 3 mLs by nebulization 2 (two) times daily.  . isosorbide dinitrate (ISORDIL) 30 MG tablet Take 30 mg by mouth daily.  . magnesium oxide (MAG-OX) 400 MG tablet Take 400 mg by mouth 2 (two) times daily.   . modafinil (PROVIGIL) 200 MG tablet Take 200 mg by mouth daily.  . nitroGLYCERIN (NITROSTAT) 0.4 MG SL tablet Place 1 tablet (0.4 mg total) under the tongue every 5 (five) minutes as needed for chest pain.  [name] omeprazole (PRILOSEC) 20 MG capsule Take 1 capsule (20 mg total) by mouth daily.  [name]  OXYGEN Inhale 2 L into the lungs daily as needed (short of breath).  . polyethylene glycol (MIRALAX / GLYCOLAX) packet Take 17 g by mouth 2 (two) times daily. (Patient taking differently: Take 17 g by mouth daily as needed for moderate constipation. )  . potassium chloride SA (K-DUR,KLOR-CON) 20 MEQ tablet Take 20 mEq by mouth daily.  . pregabalin (LYRICA) 50 MG capsule Take 50 mg by mouth 2 (two) times daily.  [name] torsemide (DEMADEX) 20 MG tablet Take 1 tablet (20 mg total) by mouth 2 (two) times daily.  . Vitamin E 180 MG CAPS Take 180 mg by mouth daily.    Past Medical History:  Diagnosis Date  . Acute heart failure  ([place])   . Aortic stenosis   . CHF (congestive heart failure) ([place])   . Complication of anesthesia    Hard to wake patient up after having anesthesia  . Diabetes mellitus without complication ([place])   . Diabetic neuropathy (H[facility])    Takes Lyrica  . Edema of both legs    Takes Lasix  . GERD (gastroesophageal reflux disease)   . High cholesterol   . HTN (hypertension)   . Hypertension   . PAD (peripheral artery disease) ([place])   . Shortness of breath dyspnea   . Spasm of back muscles   . Stroke ([place])   . Wound, open    Right great toe    Past Surgical History:  Procedure Laterality Date  . BYPASS GRAFT POPLITEAL TO TIBIAL Right 02/28/2016   Procedure: BYPASS GRAFT RIGHT BELOW KNEE POPLITEAL TO PERONEAL USING REVERSED RIGHT GREATER SAPHENOUS VEIN;  Surgeon: Conrad Ontonagon, MD;  Location: [facility];  Service: Vascular;  Laterality: Right;  . CYST EXCISION     Abdomen  . EYE SURGERY Bilateral    Cataract removal  . INTRAMEDULLARY (IM) NAIL INTERTROCHANTERIC Left 02/04/2014   Procedure: INTRAMEDULLARY (IM) NAIL INTERTROCHANTRIC FEMORAL;  Surgeon: Mauri Pole, MD;  Location: [facility];  Service: Orthopedics;  Laterality: Left;  . IR GENERIC HISTORICAL  03/01/2016   IR ANGIO INTRA EXTRACRAN SEL COM CAROTID INNOMINATE UNI R MOD SED 03/01/2016 [name], MD [facility]  . IR GENERIC HISTORICAL  03/01/2016   IR ENDOVASC INTRACRANIAL INF OTHER THAN THROMBO ART INC DIAG ANGIO 03/01/2016 [name], MD [facility]  . IR GENERIC HISTORICAL  03/01/2016   IR INTRAVSC STENT CERV CAROTID W/O EMB-PROT MOD SED INC ANGIO 03/01/2016 [name], MD [facility]  . IR GENERIC HISTORICAL  06/03/2016   IR RADIOLOGIST EVAL & MGMT 06/03/2016 [facility]  . LOWER EXTREMITY ANGIOGRAPHY Left 02/09/2019   Procedure: LOWER EXTREMITY ANGIOGRAPHY;  Surgeon: Katha Cabal, MD;  Location: [facility];  Service: Cardiovascular;  Laterality: Left;  . LOWER EXTREMITY ANGIOGRAPHY Left 03/10/2019    Procedure: LOWER EXTREMITY ANGIOGRAPHY;  Surgeon: Katha Cabal, MD;  Location: [facility];  Service: Cardiovascular;  Laterality: Left;  . LOWER EXTREMITY ANGIOGRAPHY Left 04/27/2019   Procedure: LOWER EXTREMITY ANGIOGRAPHY;  Surgeon: Katha Cabal, MD;  Location: [facility];  Service: Cardiovascular;  Laterality: Left;  . ORIF TOE FRACTURE Right 02/08/2014   Procedure: OPEN REDUCTION INTERNAL FIXATION Right METATARSAL  FRACTURE ;  Surgeon: Wylene Simmer, MD;  Location: [facility];  Service: Orthopedics;  Laterality: Right;  . PERIPHERAL VASCULAR CATHETERIZATION N/A 12/28/2015   Procedure: Abdominal Aortogram w/Lower Extremity;  Surgeon: Conrad Hunter, MD;  Location: [facility];  Service: Cardiovascular;  Laterality: N/A;  .  RADIOLOGY WITH ANESTHESIA N/A 03/01/2016   Procedure: RADIOLOGY WITH ANESTHESIA;  Surgeon: [name], MD;  Location: [facility];  Service: Radiology;  Laterality: N/A;  . VEIN HARVEST Right 02/28/2016   Procedure: RIGHT GREATER SAPHENOUS VEIN HARVEST;  Surgeon: Conrad Cambria, MD;  Location: [facility];  Service: Vascular;  Laterality: Right;    Social History Social History   Tobacco Use  . Smoking status: Never Smoker  . Smokeless tobacco: Never Used  . Tobacco comment: Never smoked  Substance Use Topics  . Alcohol use: No    Alcohol/week: 0.0 standard drinks  . Drug use: No    Family History Family History  Problem Relation Age of Onset  . Diabetes Other   . Liver disease Mother   . CVA Father   . Diabetes Father   . Hypertension Father     Allergies  Allergen Reactions  . Iodine Anaphylaxis  . Penicillins Anaphylaxis    Tolerates ceftriaxone, cefazolin Did it involve swelling of the face/tongue/throat, SOB, or low BP? Unknown Did it involve sudden or severe rash/hives, skin peeling, or any reaction on the inside of your mouth or nose? Unknown Did you need to seek medical attention at a hospital or doctor's office? Unknown  When did it last happen?Unknown If all above answers are "NO", may proceed with cephalosporin use.   . Shellfish Allergy Anaphylaxis  . Sulfa Antibiotics Anaphylaxis  . Sulfacetamide Sodium Anaphylaxis  . Sulfasalazine Anaphylaxis  . Eggs Or Egg-Derived Products     Other reaction(s): SHORTNESS OF BREATH  . Morphine And Related Other (See Comments)    Altered mental status     REVIEW OF SYSTEMS (Negative unless checked)  Constitutional: [] Weight loss  [] Fever  [] Chills Cardiac: [] Chest pain   [] Chest pressure   [] Palpitations   [] Shortness of breath when laying flat   [] Shortness of breath with exertion. Vascular:  [] Pain in legs with walking   [x] Pain in legs at rest  [] History of DVT   [] Phlebitis   [] Swelling in legs   [] Varicose veins   [x] Non-healing ulcers Pulmonary:   [] Uses home oxygen   [] Productive cough   [] Hemoptysis   [] Wheeze  [] COPD   [] Asthma Neurologic:  [] Dizziness   [] Seizures   [x] History of stroke   [] History of TIA  [] Aphasia   [] Vissual changes   [] Weakness or numbness in arm   [] Weakness or numbness in leg Musculoskeletal:   [] Joint swelling   [] Joint pain   [] Low back pain Hematologic:  [] Easy bruising  [] Easy bleeding   [] Hypercoagulable state   [] Anemic Gastrointestinal:  [] Diarrhea   [] Vomiting  [x] Gastroesophageal reflux/heartburn   [] Difficulty swallowing. Genitourinary:  [] Chronic kidney disease   [] Difficult urination  [] Frequent urination   [] Blood in urine Skin:  [] Rashes   [] Ulcers  Psychological:  [] History of anxiety   []  History of major depression.  Physical Examination  Vitals:   05/31/19 1111  BP: (!) 142/82  Pulse: 66  Resp: 18   There is no height or weight on file to calculate BMI. Gen: WD/WN, NAD Head: [place]/AT, No temporalis wasting.  Ear/Nose/Throat: Hearing grossly intact, nares w/o erythema or drainage Eyes: PER, EOMI, sclera nonicteric.  Neck: Supple, no large masses.   Pulmonary:  Good air movement, no audible wheezing  bilaterally, no use of accessory muscles.  Cardiac: RRR, no JVD Vascular: dry ulcers on the right stable noninfected; left third toe with phalangeal bone exposed with some pus noted. Vessel Right Left  PT Not Palpable Not  Palpable  DP Not Palpable Not Palpable  Gastrointestinal: Non-distended. No guarding/no peritoneal signs.  Musculoskeletal: M/S 5/5 throughout.  No deformity or atrophy.  Neurologic: CN 2-12 intact. Symmetrical.  Speech is fluent. Motor exam as listed above. Psychiatric: Judgment intact, Mood & affect appropriate for pt's clinical situation. Dermatologic: No rashes + ulcers noted.  No changes consistent with cellulitis. Lymph : No lichenification or skin changes of chronic lymphedema.  CBC Lab Results  Component Value Date   WBC 12.1 (H) 04/28/2019   HGB 10.3 (L) 04/28/2019   HCT 32.9 (L) 04/28/2019   MCV 82.5 04/28/2019   PLT 241 04/28/2019    BMET    Component Value Date/Time   NA 139 04/28/2019 0425   K 3.6 04/28/2019 0425   CL 106 04/28/2019 0425   CO2 26 04/28/2019 0425   GLUCOSE 241 (H) 04/28/2019 0425   BUN 14 04/28/2019 0425   CREATININE 0.67 04/28/2019 0425   CALCIUM 9.1 04/28/2019 0425   GFRNONAA >60 04/28/2019 0425   GFRAA >60 04/28/2019 0425   CrCl cannot be calculated (Patient's most recent lab result is older than the maximum 21 days allowed.).  COAG Lab Results  Component Value Date   INR 1.2 12/23/2018   INR 1.07 09/16/2016   INR 1.20 02/26/2016    Radiology No results found.   Assessment/Plan 1. 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  Her ABIs show marked improvement on the right consistent with her recent intervention unfortunately the left ABI has returned to its pre-July intervention level.  This suggests the anterior tibial has likely gone down on the left.   Therefore I would recommend reintervention followed by moving forward with amputation while we have reestablished adequate arterial perfusion.  The patient has evidence of severe atherosclerotic changes of both lower extremities associated with ulceration and tissue loss of the left foot.  This represents a limb threatening ischemia and places the patient at the risk for left limb loss.  Patient should undergo angiography of the left lower extremities with the hope for intervention for limb salvage.  The risks and benefits as well as the alternative therapies was discussed in detail with the patient.  All questions were answered.  Patient agrees to proceed with left leg angiogram angiography.  The patient will follow up with me in the office after the procedure.    2. Essential hypertension Continue antihypertensive medications as already ordered, these medications have been reviewed and there are no changes at this time.   3. Type 2 diabetes mellitus with other circulatory complication, with long-term current use of insulin (HCC) Continue hypoglycemic medications as already ordered, these medications have been reviewed and there are no changes at this time.  Hgb A1C to be monitored as already arranged by primary service   4. Mixed hyperlipidemia Continue statin as ordered and reviewed, no changes at this time     Hortencia Pilar, MD  05/31/2019 11:12 AM

## 2019-05-31 NOTE — Telephone Encounter (Signed)
Called and spoke with the patient's husband and she is scheduled for angio with Dr. Delana Meyer on 06/15/2019 with a 11:00 am arrival time to the [facility]. Patient is also scheduled for surgery with Dr. Delana Meyer on 06/16/2019. Patient will do her Covid testing and pre-op on 06/11/2019 at 9:45 am at the [facility]. The pre-procedure and surgical instructions were discussed and will be mailed to the patient.

## 2019-06-02 ENCOUNTER — Inpatient Hospital Stay
Admission: EM | Admit: 2019-06-02 | Discharge: 2019-06-11 | DRG: 253 | Disposition: A | Discharge status: Home / Self Care | Payer: Medicare HMO | Source: Ambulatory Visit | Attending: Internal Medicine | Admitting: Internal Medicine

## 2019-06-02 ENCOUNTER — Encounter: Payer: Self-pay | Admitting: *Deleted

## 2019-06-02 ENCOUNTER — Other Ambulatory Visit: Payer: Self-pay

## 2019-06-02 DIAGNOSIS — E1159 Type 2 diabetes mellitus with other circulatory complications: Secondary | ICD-10-CM | POA: Diagnosis not present

## 2019-06-02 DIAGNOSIS — Z993 Dependence on wheelchair: Secondary | ICD-10-CM

## 2019-06-02 DIAGNOSIS — G609 Hereditary and idiopathic neuropathy, unspecified: Secondary | ICD-10-CM | POA: Diagnosis present

## 2019-06-02 DIAGNOSIS — Z7401 Bed confinement status: Secondary | ICD-10-CM

## 2019-06-02 DIAGNOSIS — G9340 Encephalopathy, unspecified: Secondary | ICD-10-CM | POA: Diagnosis not present

## 2019-06-02 DIAGNOSIS — I110 Hypertensive heart disease with heart failure: Secondary | ICD-10-CM | POA: Diagnosis present

## 2019-06-02 DIAGNOSIS — Z6827 Body mass index (BMI) 27.0-27.9, adult: Secondary | ICD-10-CM

## 2019-06-02 DIAGNOSIS — Z7951 Long term (current) use of inhaled steroids: Secondary | ICD-10-CM | POA: Diagnosis not present

## 2019-06-02 DIAGNOSIS — Z885 Allergy status to narcotic agent status: Secondary | ICD-10-CM | POA: Diagnosis not present

## 2019-06-02 DIAGNOSIS — Z515 Encounter for palliative care: Secondary | ICD-10-CM

## 2019-06-02 DIAGNOSIS — Z882 Allergy status to sulfonamides status: Secondary | ICD-10-CM

## 2019-06-02 DIAGNOSIS — I5032 Chronic diastolic (congestive) heart failure: Secondary | ICD-10-CM | POA: Diagnosis present

## 2019-06-02 DIAGNOSIS — Z8619 Personal history of other infectious and parasitic diseases: Secondary | ICD-10-CM | POA: Diagnosis not present

## 2019-06-02 DIAGNOSIS — Z20828 Contact with and (suspected) exposure to other viral communicable diseases: Secondary | ICD-10-CM | POA: Diagnosis present

## 2019-06-02 DIAGNOSIS — I509 Heart failure, unspecified: Secondary | ICD-10-CM | POA: Diagnosis not present

## 2019-06-02 DIAGNOSIS — Z833 Family history of diabetes mellitus: Secondary | ICD-10-CM | POA: Diagnosis not present

## 2019-06-02 DIAGNOSIS — I70202 Unspecified atherosclerosis of native arteries of extremities, left leg: Secondary | ICD-10-CM | POA: Diagnosis not present

## 2019-06-02 DIAGNOSIS — Z7902 Long term (current) use of antithrombotics/antiplatelets: Secondary | ICD-10-CM

## 2019-06-02 DIAGNOSIS — E1152 Type 2 diabetes mellitus with diabetic peripheral angiopathy with gangrene: Secondary | ICD-10-CM | POA: Diagnosis present

## 2019-06-02 DIAGNOSIS — Z91012 Allergy to eggs: Secondary | ICD-10-CM

## 2019-06-02 DIAGNOSIS — Z880 Allergy status to penicillin: Secondary | ICD-10-CM

## 2019-06-02 DIAGNOSIS — R011 Cardiac murmur, unspecified: Secondary | ICD-10-CM | POA: Diagnosis not present

## 2019-06-02 DIAGNOSIS — Z794 Long term (current) use of insulin: Secondary | ICD-10-CM

## 2019-06-02 DIAGNOSIS — Z79899 Other long term (current) drug therapy: Secondary | ICD-10-CM

## 2019-06-02 DIAGNOSIS — I96 Gangrene, not elsewhere classified: Secondary | ICD-10-CM | POA: Diagnosis present

## 2019-06-02 DIAGNOSIS — K219 Gastro-esophageal reflux disease without esophagitis: Secondary | ICD-10-CM | POA: Diagnosis present

## 2019-06-02 DIAGNOSIS — Z95820 Peripheral vascular angioplasty status with implants and grafts: Secondary | ICD-10-CM | POA: Diagnosis not present

## 2019-06-02 DIAGNOSIS — E1142 Type 2 diabetes mellitus with diabetic polyneuropathy: Secondary | ICD-10-CM | POA: Diagnosis present

## 2019-06-02 DIAGNOSIS — Z89412 Acquired absence of left great toe: Secondary | ICD-10-CM | POA: Diagnosis not present

## 2019-06-02 DIAGNOSIS — B9620 Unspecified Escherichia coli [E. coli] as the cause of diseases classified elsewhere: Secondary | ICD-10-CM | POA: Diagnosis not present

## 2019-06-02 DIAGNOSIS — Z888 Allergy status to other drugs, medicaments and biological substances status: Secondary | ICD-10-CM | POA: Diagnosis not present

## 2019-06-02 DIAGNOSIS — R8271 Bacteriuria: Secondary | ICD-10-CM | POA: Diagnosis not present

## 2019-06-02 DIAGNOSIS — N390 Urinary tract infection, site not specified: Secondary | ICD-10-CM | POA: Diagnosis present

## 2019-06-02 DIAGNOSIS — E785 Hyperlipidemia, unspecified: Secondary | ICD-10-CM | POA: Diagnosis present

## 2019-06-02 DIAGNOSIS — E1151 Type 2 diabetes mellitus with diabetic peripheral angiopathy without gangrene: Secondary | ICD-10-CM | POA: Diagnosis not present

## 2019-06-02 DIAGNOSIS — E119 Type 2 diabetes mellitus without complications: Secondary | ICD-10-CM

## 2019-06-02 DIAGNOSIS — Z8673 Personal history of transient ischemic attack (TIA), and cerebral infarction without residual deficits: Secondary | ICD-10-CM

## 2019-06-02 DIAGNOSIS — I70203 Unspecified atherosclerosis of native arteries of extremities, bilateral legs: Secondary | ICD-10-CM | POA: Diagnosis not present

## 2019-06-02 DIAGNOSIS — I739 Peripheral vascular disease, unspecified: Secondary | ICD-10-CM | POA: Diagnosis present

## 2019-06-02 DIAGNOSIS — Z1612 Extended spectrum beta lactamase (ESBL) resistance: Secondary | ICD-10-CM | POA: Diagnosis present

## 2019-06-02 DIAGNOSIS — I10 Essential (primary) hypertension: Secondary | ICD-10-CM | POA: Diagnosis not present

## 2019-06-02 DIAGNOSIS — L8990 Pressure ulcer of unspecified site, unspecified stage: Secondary | ICD-10-CM | POA: Diagnosis present

## 2019-06-02 DIAGNOSIS — I998 Other disorder of circulatory system: Secondary | ICD-10-CM | POA: Diagnosis not present

## 2019-06-02 DIAGNOSIS — Z87440 Personal history of urinary (tract) infections: Secondary | ICD-10-CM | POA: Diagnosis not present

## 2019-06-02 DIAGNOSIS — Z823 Family history of stroke: Secondary | ICD-10-CM

## 2019-06-02 DIAGNOSIS — E782 Mixed hyperlipidemia: Secondary | ICD-10-CM | POA: Diagnosis present

## 2019-06-02 DIAGNOSIS — I350 Nonrheumatic aortic (valve) stenosis: Secondary | ICD-10-CM | POA: Diagnosis present

## 2019-06-02 DIAGNOSIS — E7800 Pure hypercholesterolemia, unspecified: Secondary | ICD-10-CM | POA: Diagnosis present

## 2019-06-02 DIAGNOSIS — Z8249 Family history of ischemic heart disease and other diseases of the circulatory system: Secondary | ICD-10-CM

## 2019-06-02 DIAGNOSIS — Z89422 Acquired absence of other left toe(s): Secondary | ICD-10-CM | POA: Diagnosis not present

## 2019-06-02 LAB — COMPREHENSIVE METABOLIC PANEL
ALT: 18 U/L (ref 0–44)
AST: 18 U/L (ref 15–41)
Albumin: 3.3 g/dL — ABNORMAL LOW (ref 3.5–5.0)
Alkaline Phosphatase: 70 U/L (ref 38–126)
Anion gap: 14 (ref 5–15)
BUN: 12 mg/dL (ref 8–23)
CO2: 23 mmol/L (ref 22–32)
Calcium: 9.6 mg/dL (ref 8.9–10.3)
Chloride: 102 mmol/L (ref 98–111)
Creatinine, Ser: 0.64 mg/dL (ref 0.44–1.00)
GFR calc Af Amer: >60 mL/min (ref >60)
GFR calc non Af Amer: >60 mL/min (ref >60)
Glucose, Bld: 178 mg/dL — ABNORMAL HIGH (ref 70–99)
Potassium: 4.1 mmol/L (ref 3.5–5.1)
Sodium: 139 mmol/L (ref 135–145)
Total Bilirubin: 0.5 mg/dL (ref 0.3–1.2)
Total Protein: 6.7 g/dL (ref 6.5–8.1)

## 2019-06-02 LAB — URINALYSIS, COMPLETE (UACMP) WITH MICROSCOPIC
Bacteria, UA: NONE SEEN
Bilirubin Urine: NEGATIVE
Glucose, UA: NEGATIVE mg/dL
Hgb urine dipstick: NEGATIVE
Ketones, ur: NEGATIVE mg/dL
Nitrite: NEGATIVE
Protein, ur: 30 mg/dL — AB
Specific Gravity, Urine: 1.011 (ref 1.005–1.030)
Squamous Epithelial / HPF: NONE SEEN (ref 0–5)
WBC, UA: >50 WBC/hpf — ABNORMAL HIGH (ref 0–5)
pH: 7 (ref 5.0–8.0)

## 2019-06-02 LAB — CBC
HCT: 38.2 % (ref 36.0–46.0)
Hemoglobin: 11.9 g/dL — ABNORMAL LOW (ref 12.0–15.0)
MCH: 25.8 pg — ABNORMAL LOW (ref 26.0–34.0)
MCHC: 31.2 g/dL (ref 30.0–36.0)
MCV: 82.7 fL (ref 80.0–100.0)
Platelets: 310 10*3/uL (ref 150–400)
RBC: 4.62 MIL/uL (ref 3.87–5.11)
RDW: 15.2 % (ref 11.5–15.5)
WBC: 12.8 10*3/uL — ABNORMAL HIGH (ref 4.0–10.5)
nRBC: 0 % (ref 0.0–0.2)

## 2019-06-02 LAB — HEMOGLOBIN A1C
Hgb A1c MFr Bld: 8.3 % — ABNORMAL HIGH (ref 4.8–5.6)
Mean Plasma Glucose: 191.51 mg/dL

## 2019-06-02 LAB — GLUCOSE, CAPILLARY: Glucose-Capillary: 224 mg/dL — ABNORMAL HIGH (ref 70–99)

## 2019-06-02 MED ORDER — ATORVASTATIN CALCIUM 20 MG PO TABS
20.0000 mg | ORAL_TABLET | Freq: Every day | ORAL | Status: DC
  Administered 2019-06-02 – 2019-06-10 (×9): 20 mg via ORAL
  Filled 2019-06-02 (×9): qty 1

## 2019-06-02 MED ORDER — ONDANSETRON HCL 4 MG/2ML IJ SOLN: 4.0000 mg | Freq: Four times a day (QID) | INTRAMUSCULAR | Status: DC | PRN

## 2019-06-02 MED ORDER — PANTOPRAZOLE SODIUM 40 MG PO TBEC
40.0000 mg | DELAYED_RELEASE_TABLET | Freq: Every day | ORAL | Status: DC
  Administered 2019-06-02 – 2019-06-11 (×8): 40 mg via ORAL
  Filled 2019-06-02 (×8): qty 1

## 2019-06-02 MED ORDER — ACETAMINOPHEN 650 MG RE SUPP: 650.0000 mg | Freq: Four times a day (QID) | RECTAL | Status: DC | PRN

## 2019-06-02 MED ORDER — ASPIRIN EC 81 MG PO TBEC
81.0000 mg | DELAYED_RELEASE_TABLET | Freq: Every day | ORAL | Status: DC
  Administered 2019-06-03 – 2019-06-11 (×7): 81 mg via ORAL
  Filled 2019-06-02 (×7): qty 1

## 2019-06-02 MED ORDER — AMLODIPINE BESYLATE 10 MG PO TABS
10.0000 mg | ORAL_TABLET | Freq: Every day | ORAL | Status: DC
  Administered 2019-06-03 – 2019-06-11 (×7): 10 mg via ORAL
  Filled 2019-06-02 (×7): qty 1

## 2019-06-02 MED ORDER — CARVEDILOL 3.125 MG PO TABS
6.2500 mg | ORAL_TABLET | Freq: Two times a day (BID) | ORAL | Status: DC
  Administered 2019-06-03 – 2019-06-11 (×13): 6.25 mg via ORAL
  Filled 2019-06-02 (×13): qty 2

## 2019-06-02 MED ORDER — INSULIN ASPART 100 UNIT/ML ~~LOC~~ SOLN
0.0000 [IU] | Freq: Three times a day (TID) | SUBCUTANEOUS | Status: DC
  Administered 2019-06-03: 1 [IU] via SUBCUTANEOUS
  Administered 2019-06-03: 3 [IU] via SUBCUTANEOUS
  Administered 2019-06-03: 1 [IU] via SUBCUTANEOUS
  Administered 2019-06-04: 17:00:00 9 [IU] via SUBCUTANEOUS
  Administered 2019-06-04: 3 [IU] via SUBCUTANEOUS
  Administered 2019-06-05: 17:00:00 5 [IU] via SUBCUTANEOUS
  Administered 2019-06-05: 2 [IU] via SUBCUTANEOUS
  Administered 2019-06-05: 13:00:00 3 [IU] via SUBCUTANEOUS
  Administered 2019-06-06: 12:00:00 1 [IU] via SUBCUTANEOUS
  Administered 2019-06-06 – 2019-06-07 (×2): 3 [IU] via SUBCUTANEOUS
  Administered 2019-06-07: 1 [IU] via SUBCUTANEOUS
  Administered 2019-06-07: 2 [IU] via SUBCUTANEOUS
  Administered 2019-06-08: 5 [IU] via SUBCUTANEOUS
  Administered 2019-06-08: 2 [IU] via SUBCUTANEOUS
  Administered 2019-06-09: 3 [IU] via SUBCUTANEOUS
  Administered 2019-06-09: 0 [IU] via SUBCUTANEOUS
  Administered 2019-06-10 (×2): 1 [IU] via SUBCUTANEOUS
  Administered 2019-06-11: 13:00:00 2 [IU] via SUBCUTANEOUS
  Filled 2019-06-02 (×20): qty 1

## 2019-06-02 MED ORDER — INSULIN ASPART 100 UNIT/ML ~~LOC~~ SOLN
0.0000 [IU] | Freq: Every day | SUBCUTANEOUS | Status: DC
  Administered 2019-06-02 – 2019-06-03 (×2): 2 [IU] via SUBCUTANEOUS
  Administered 2019-06-04: 4 [IU] via SUBCUTANEOUS
  Administered 2019-06-06 – 2019-06-08 (×2): 3 [IU] via SUBCUTANEOUS
  Administered 2019-06-10: 2 [IU] via SUBCUTANEOUS
  Filled 2019-06-02 (×8): qty 1

## 2019-06-02 MED ORDER — ONDANSETRON HCL 4 MG PO TABS: 4.0000 mg | ORAL_TABLET | Freq: Four times a day (QID) | ORAL | Status: DC | PRN

## 2019-06-02 MED ORDER — SODIUM CHLORIDE 0.9 % IV SOLN
1.0000 g | Freq: Three times a day (TID) | INTRAVENOUS | Status: DC
  Administered 2019-06-02 – 2019-06-03 (×3): 1 g via INTRAVENOUS
  Filled 2019-06-02 (×4): qty 1

## 2019-06-02 MED ORDER — ACETAMINOPHEN 325 MG PO TABS
650.0000 mg | ORAL_TABLET | Freq: Four times a day (QID) | ORAL | Status: DC | PRN
  Administered 2019-06-08 – 2019-06-11 (×2): 650 mg via ORAL
  Filled 2019-06-02 (×2): qty 2

## 2019-06-02 MED ORDER — ENOXAPARIN SODIUM 40 MG/0.4ML ~~LOC~~ SOLN
40.0000 mg | SUBCUTANEOUS | Status: DC
  Administered 2019-06-02 – 2019-06-06 (×4): 40 mg via SUBCUTANEOUS
  Filled 2019-06-02 (×4): qty 0.4

## 2019-06-02 NOTE — ED Triage Notes (Signed)
Per EMS from [facility] report, Patient is bed bound at home and has recurrent UTIs.  Patient was on Macrobid in September and Cefepime in October finishing on 10/19. Home Health called EMS to transport for IV antibiotics due to recent urine specimen still showed a UTI.

## 2019-06-02 NOTE — ED Notes (Addendum)
Patient's husband Kealee Heinly would like to be contacted at [phone]. Cell phone [phone].

## 2019-06-02 NOTE — ED Provider Notes (Signed)
[facility] Emergency Department Provider Note  Time seen: 7:12 PM  I have reviewed the triage vital signs and the nursing notes.   HISTORY  Chief Complaint Recurrent UTI   HPI [name] is a 78 y.o. female with a past medical history of CHF, diabetes, gastric reflux, hypertension, presents to the emergency department for recurrent urinary tract infection.  According to the patient's husband patient has been experiencing a significant urinary tract infection over the past 1 month.  Has gone through 2 rounds of oral antibiotics initially nitrofurantoin and then a cephalosporin, continued to have a significant urinary tract infection that her doctor sent her into the emergency department today per her husband to be admitted for IV antibiotics.  Here the patient is calm cooperative and pleasant.  Denies any fever cough or shortness of breath.  No abdominal pain nausea vomiting or diarrhea.   Past Medical History:  Diagnosis Date  . Acute heart failure ([place])   . Aortic stenosis   . CHF (congestive heart failure) ([place])   . Complication of anesthesia    Hard to wake patient up after having anesthesia  . Diabetes mellitus without complication ([place])   . Diabetic neuropathy (HCC)    Takes Lyrica  . Edema of both legs    Takes Lasix  . GERD (gastroesophageal reflux disease)   . High cholesterol   . HTN (hypertension)   . Hypertension   . PAD (peripheral artery disease) ([place])   . Shortness of breath dyspnea   . Spasm of back muscles   . Stroke ([place])   . Wound, open    Right great toe    Patient Active Problem List   Diagnosis Date Noted  . PAD (peripheral artery disease) ([place]) 04/27/2019  . ESBL (extended spectrum beta-lactamase) producing bacteria infection 12/25/2018  . E coli bacteremia 12/25/2018  . HTN (hypertension), malignant 12/23/2018  . Healthcare-associated pneumonia 06/03/2018  . Acute cystitis 04/10/2018  . Benign essential HTN 04/22/2017   . Sepsis due to Streptococcus, group B ([place]) 09/12/2016  . Multiple open wounds of lower leg 09/12/2016  . PVD (peripheral vascular disease) ([place]) 09/12/2016  . Bacteremia 08/31/2016  . Ulcer of right lower extremity ([place]) 08/29/2016  . Sepsis ([place]) 08/29/2016  . Acute on chronic diastolic heart failure ([place]) 07/08/2016  . HTN (hypertension) 07/05/2016  . Chest pain 07/05/2016  . Impacted cerumen of both ears 06/05/2016  . 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 CHF (congestive heart failure) ([place]) 03/10/2016  . HLD (hyperlipidemia) 03/09/2016  . Pleural effusion, bilateral 03/09/2016  . Wheeze 03/09/2016  . Dyspnea 03/08/2016  . Right sided weakness 03/05/2016  . Acute respiratory acidosis 03/05/2016  . Surgery, elective 03/04/2016  . Anemia, iron deficiency 03/03/2016  . Leukocytosis 03/02/2016  . Cerebrovascular accident (CVA) due to thrombosis of precerebral artery ([place]) 03/01/2016  . 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 Atherosclerosis of native arteries of the extremities with gangrene ([place]) 12/08/2015  . Open toe wound 05/04/2014  . Anemia, chronic disease 04/27/2014  . Occult blood positive stool 04/17/2014  . Edema 04/03/2014  . Unspecified hereditary and idiopathic peripheral neuropathy 03/14/2014  . DM2 (diabetes mellitus, type 2) ([place]) 02/14/2014  . Altered mental status 02/12/2014  . Acute encephalopathy 02/12/2014  . Aortic stenosis 02/04/2014  . Closed left subtrochanteric femur fracture ([place]) 02/03/2014  . Hip fracture, left ([place]) 02/03/2014  . Hypokalemia 02/03/2014  . Fibula fracture 02/03/2014  .  MTP instability 02/03/2014  . Hip fracture ([place]) 02/03/2014    Past Surgical History:  Procedure Laterality Date  . BYPASS GRAFT POPLITEAL TO TIBIAL Right 02/28/2016   Procedure: BYPASS GRAFT RIGHT BELOW KNEE POPLITEAL TO PERONEAL USING REVERSED RIGHT GREATER SAPHENOUS VEIN;  Surgeon: Conrad [place],  MD;  Location: [place];  Service: Vascular;  Laterality: Right;  . CYST EXCISION     Abdomen  . EYE SURGERY Bilateral    Cataract removal  . INTRAMEDULLARY (IM) NAIL INTERTROCHANTERIC Left 02/04/2014   Procedure: INTRAMEDULLARY (IM) NAIL INTERTROCHANTRIC FEMORAL;  Surgeon: Mauri Pole, MD;  Location: [place];  Service: Orthopedics;  Laterality: Left;  . IR GENERIC HISTORICAL  03/01/2016   IR ANGIO INTRA EXTRACRAN SEL COM CAROTID INNOMINATE UNI R MOD SED 03/01/2016 [name], MD [facility]  . IR GENERIC HISTORICAL  03/01/2016   IR ENDOVASC INTRACRANIAL INF OTHER THAN THROMBO ART INC DIAG ANGIO 03/01/2016 [name], MD [facility]  . IR GENERIC HISTORICAL  03/01/2016   IR INTRAVSC STENT CERV CAROTID W/O EMB-PROT MOD SED INC ANGIO 03/01/2016 [name], MD [facility]  . IR GENERIC HISTORICAL  06/03/2016   IR RADIOLOGIST EVAL & MGMT 06/03/2016 [facility]  . LOWER EXTREMITY ANGIOGRAPHY Left 02/09/2019   Procedure: LOWER EXTREMITY ANGIOGRAPHY;  Surgeon: Katha Cabal, MD;  Location: [place] [facility];  Service: Cardiovascular;  Laterality: Left;  . LOWER EXTREMITY ANGIOGRAPHY Left 03/10/2019   Procedure: LOWER EXTREMITY ANGIOGRAPHY;  Surgeon: Katha Cabal, MD;  Location: [place] [facility];  Service: Cardiovascular;  Laterality: Left;  . LOWER EXTREMITY ANGIOGRAPHY Left 04/27/2019   Procedure: LOWER EXTREMITY ANGIOGRAPHY;  Surgeon: Katha Cabal, MD;  Location: [place] [facility];  Service: Cardiovascular;  Laterality: Left;  . ORIF TOE FRACTURE Right 02/08/2014   Procedure: OPEN REDUCTION INTERNAL FIXATION Right METATARSAL  FRACTURE ;  Surgeon: Wylene Simmer, MD;  Location: [place];  Service: Orthopedics;  Laterality: Right;  . PERIPHERAL VASCULAR CATHETERIZATION N/A 12/28/2015   Procedure: Abdominal Aortogram w/Lower Extremity;  Surgeon: Conrad Pendleton, MD;  Location: [place] [facility];  Service: Cardiovascular;  Laterality: N/A;  . RADIOLOGY WITH ANESTHESIA  N/A 03/01/2016   Procedure: RADIOLOGY WITH ANESTHESIA;  Surgeon: [name], MD;  Location: [place];  Service: Radiology;  Laterality: N/A;  . VEIN HARVEST Right 02/28/2016   Procedure: RIGHT GREATER SAPHENOUS VEIN HARVEST;  Surgeon: Conrad Avon, MD;  Location: [place];  Service: Vascular;  Laterality: Right;    Prior to Admission medications   Medication Sig Start Date End Date Taking? Authorizing Provider  acetaminophen (TYLENOL) 500 MG tablet Take 1,000 mg by mouth every 6 (six) hours as needed for mild pain.    [provider]  albuterol (PROVENTIL HFA;VENTOLIN HFA) 108 (90 Base) MCG/ACT inhaler Inhale 2 puffs into the lungs every 6 (six) hours as needed for wheezing or shortness of breath.     [provider]  amLODipine (NORVASC) 10 MG tablet Take 10 mg by mouth daily.    [provider]  aspirin EC 81 MG EC tablet Take 1 tablet (81 mg total) by mouth daily. 04/29/19   Stegmayer, Joelene Millin A, PA-C  atorvastatin (LIPITOR) 20 MG tablet Take 1 tablet (20 mg total) by mouth daily. Patient taking differently: Take 20 mg by mouth at bedtime.  03/13/16   Virgina Jock A, PA-C  budesonide (PULMICORT) 0.25 MG/2ML nebulizer solution Take 2 mLs (0.25 mg total) by nebulization 2 (two) times daily. 06/10/18  Dustin Flock, MD  carvedilol (COREG) 6.25 MG tablet Take 6.25 mg by mouth 2 (two) times daily with a meal.    [provider]  cetirizine (ZYRTEC) 10 MG tablet Take 10 mg by mouth daily.     [provider]  Cholecalciferol (VITAMIN D) 50 MCG (2000 UT) CAPS Take 2,000 Units by mouth daily.     [provider]  clopidogrel (PLAVIX) 75 MG tablet Take 1 tablet (75 mg total) by mouth daily with breakfast. 03/04/17   Conrad Stockton, MD  docusate sodium (COLACE) 100 MG capsule Take 100 mg by mouth 2 (two) times daily.    [provider]  fluticasone (FLONASE) 50 MCG/ACT nasal spray Place 1 spray into both nostrils daily as needed for  allergies.     [provider]  gabapentin (NEURONTIN) 300 MG capsule Take 300 mg by mouth every evening.  12/07/18   [provider]  hydrALAZINE (APRESOLINE) 25 MG tablet Take 1 tablet (25 mg total) by mouth every 8 (eight) hours. 12/28/18   Fritzi Mandes, MD  insulin glargine (LANTUS) 100 UNIT/ML injection Inject 0.15 mLs (15 Units total) into the skin at bedtime. 12/28/18   Fritzi Mandes, MD  ipratropium-albuterol (DUONEB) 0.5-2.5 (3) MG/3ML SOLN Take 3 mLs by nebulization 2 (two) times daily. 06/10/18   Dustin Flock, MD  isosorbide dinitrate (ISORDIL) 30 MG tablet Take 30 mg by mouth daily.    [provider]  magnesium oxide (MAG-OX) 400 MG tablet Take 400 mg by mouth 2 (two) times daily.     [provider]  modafinil (PROVIGIL) 200 MG tablet Take 200 mg by mouth daily.    [provider]  nitroGLYCERIN (NITROSTAT) 0.4 MG SL tablet Place 1 tablet (0.4 mg total) under the tongue every 5 (five) minutes as needed for chest pain. 07/09/16   Holley Raring, MD  omeprazole (PRILOSEC) 20 MG capsule Take 1 capsule (20 mg total) by mouth daily. 04/17/18   Henreitta Leber, MD  OXYGEN Inhale 2 L into the lungs daily as needed (short of breath).    [provider]  polyethylene glycol (MIRALAX / GLYCOLAX) packet Take 17 g by mouth 2 (two) times daily. Patient taking differently: Take 17 g by mouth daily as needed for moderate constipation.  02/09/14   Thurnell Lose, MD  potassium chloride SA (K-DUR,KLOR-CON) 20 MEQ tablet Take 20 mEq by mouth daily.    [provider]  pregabalin (LYRICA) 50 MG capsule Take 50 mg by mouth 2 (two) times daily.    [provider]  torsemide (DEMADEX) 20 MG tablet Take 1 tablet (20 mg total) by mouth 2 (two) times daily. 12/28/18   Fritzi Mandes, MD  Vitamin E 180 MG CAPS Take 180 mg by mouth daily.    [provider]    Allergies  Allergen Reactions  . Iodine Anaphylaxis  . Penicillins  Anaphylaxis    Tolerates ceftriaxone, cefazolin Did it involve swelling of the face/tongue/throat, SOB, or low BP? Unknown Did it involve sudden or severe rash/hives, skin peeling, or any reaction on the inside of your mouth or nose? Unknown Did you need to seek medical attention at a hospital or doctor's office? Unknown When did it last happen?Unknown If all above answers are "NO", may proceed with cephalosporin use.   . Shellfish Allergy Anaphylaxis  . Sulfa Antibiotics Anaphylaxis  . Sulfacetamide Sodium Anaphylaxis  . Sulfasalazine Anaphylaxis  . Eggs Or Egg-Derived Products     Other  reaction(s): SHORTNESS OF BREATH  . Morphine And Related Other (See Comments)    Altered mental status    Family History  Problem Relation Age of Onset  . Diabetes Other   . Liver disease Mother   . CVA Father   . Diabetes Father   . Hypertension Father     Social History Social History   Tobacco Use  . Smoking status: Never Smoker  . Smokeless tobacco: Never Used  . Tobacco comment: Never smoked  Substance Use Topics  . Alcohol use: No    Alcohol/week: 0.0 standard drinks  . Drug use: No    Review of Systems Constitutional: Negative for fever Cardiovascular: Negative for chest pain. Respiratory: Negative for shortness of breath. Gastrointestinal: Negative for abdominal pain, vomiting and diarrhea. Genitourinary: Negative for urinary compaints Musculoskeletal: Negative for musculoskeletal complaints Skin: Negative for skin complaints  Neurological: Negative for headache All other ROS negative  ____________________________________________   PHYSICAL EXAM:  VITAL SIGNS: ED Triage Vitals  Enc Vitals Group     BP 06/02/19 1840 (!) 152/55     Pulse Rate 06/02/19 1840 82     Resp 06/02/19 1840 18     Temp 06/02/19 1840 98 F (36.7 C)     Temp Source 06/02/19 1840 Oral     SpO2 06/02/19 1840 97 %     Weight 06/02/19 1843 163 lb (73.9 kg)     Height 06/02/19 1843 5'  4" (1.626 m)     Head Circumference --      Peak Flow --      Pain Score 06/02/19 1841 0     Pain Loc --      Pain Edu? --      Excl. in [place]? --     Constitutional: Alert. Well appearing and in no distress. Eyes: Normal exam ENT      Head: Normocephalic and atraumatic.      Mouth/Throat: Mucous membranes are moist. Cardiovascular: Normal rate, regular rhythm. Respiratory: Normal respiratory effort without tachypnea nor retractions. Breath sounds are clear  Gastrointestinal: Soft and nontender. No distention.   Musculoskeletal: Nontender with normal range of motion in all extremities. Neurologic:  Normal speech and language.  Skin:  Skin is warm, dry and intact.  Psychiatric: Mood and affect are normal.  ____________________________________________  INITIAL IMPRESSION / ASSESSMENT AND PLAN / ED COURSE  Pertinent labs & imaging results that were available during my care of the patient were reviewed by me and considered in my medical decision making (see chart for details).   Patient presents to the emergency department over concerns over recurrent/continued urinary tract infection despite 2 rounds of oral antibiotics at home.  Differential would include recurrent UTIs, ESBL or resistant infection.  We will check labs, urine culture, and continue to closely monitor while awaiting results.  Overall the patient appears well.  Patient's urinalysis appears to be infected with greater than 50 white blood cells.  We will send a urine culture.  Patient has a history of nearly pan resistant urinary tract infections with a admission in May for ESBL appearing E. coli infection.  They have been susceptible to meropenem in the past.  Patient does have an anaphylactic penicillin allergy however appears to have received meropenem at least twice per record review.  We will restart meropenem and admit to the hospitalist service once labs have resulted.  [name] was evaluated in Emergency Department  on 06/02/2019 for the symptoms described in the history of present  illness. She was evaluated in the context of the global COVID-19 pandemic, which necessitated consideration that the patient might be at risk for infection with the SARS-CoV-2 virus that causes COVID-19. Institutional protocols and algorithms that pertain to the evaluation of patients at risk for COVID-19 are in a state of rapid change based on information released by regulatory bodies including the CDC and federal and state organizations. These policies and algorithms were followed during the patient's care in the ED.  ____________________________________________   FINAL CLINICAL IMPRESSION(S) / ED DIAGNOSES  Urinary tract infection   Harvest Dark, MD 06/02/19 2037

## 2019-06-02 NOTE — H&P (Signed)
[facility] NAME: [name]    MR#:  [id number]  DATE OF BIRTH:  1941/02/12  DATE OF ADMISSION:  06/02/2019  PRIMARY CARE PHYSICIAN: [name], MD   REQUESTING/REFERRING PHYSICIAN: Kerman Passey, MD  CHIEF COMPLAINT:   Chief Complaint  Patient presents with  . Recurrent UTI    HISTORY OF PRESENT ILLNESS:  [name]  is a 78 y.o. female who presents with chief complaint as above.  Patient presents the ED because she was told by her primary doctor that she has persistent UTI.  She has been taking Macrobid, but on repeat urine testing she was told to come to the hospital because her urine is still infected.  She has a history of ESBL E. coli UTIs.  In the past she has required PICC line for antibiotics due to multidrug resistance.  Hospitalist were called for admission  PAST MEDICAL HISTORY:   Past Medical History:  Diagnosis Date  . Acute heart failure ([place])   . Aortic stenosis   . CHF (congestive heart failure) ([place])   . Complication of anesthesia    Hard to wake patient up after having anesthesia  . Diabetes mellitus without complication ([place])   . Diabetic neuropathy (H[facility])    Takes Lyrica  . Edema of both legs    Takes Lasix  . GERD (gastroesophageal reflux disease)   . High cholesterol   . HTN (hypertension)   . Hypertension   . PAD (peripheral artery disease) ([place])   . Shortness of breath dyspnea   . Spasm of back muscles   . Stroke ([place])   . Wound, open    Right great toe     PAST SURGICAL HISTORY:   Past Surgical History:  Procedure Laterality Date  . BYPASS GRAFT POPLITEAL TO TIBIAL Right 02/28/2016   Procedure: BYPASS GRAFT RIGHT BELOW KNEE POPLITEAL TO PERONEAL USING REVERSED RIGHT GREATER SAPHENOUS VEIN;  Surgeon: Conrad Lewisville, MD;  Location: [facility];  Service: Vascular;  Laterality: Right;  . CYST EXCISION     Abdomen  . EYE SURGERY Bilateral    Cataract removal  . INTRAMEDULLARY (IM)  NAIL INTERTROCHANTERIC Left 02/04/2014   Procedure: INTRAMEDULLARY (IM) NAIL INTERTROCHANTRIC FEMORAL;  Surgeon: Mauri Pole, MD;  Location: [facility];  Service: Orthopedics;  Laterality: Left;  . IR GENERIC HISTORICAL  03/01/2016   IR ANGIO INTRA EXTRACRAN SEL COM CAROTID INNOMINATE UNI R MOD SED 03/01/2016 [name], MD [facility]  . IR GENERIC HISTORICAL  03/01/2016   IR ENDOVASC INTRACRANIAL INF OTHER THAN THROMBO ART INC DIAG ANGIO 03/01/2016 [name], MD [facility]  . IR GENERIC HISTORICAL  03/01/2016   IR INTRAVSC STENT CERV CAROTID W/O EMB-PROT MOD SED INC ANGIO 03/01/2016 [name], MD [facility]  . IR GENERIC HISTORICAL  06/03/2016   IR RADIOLOGIST EVAL & MGMT 06/03/2016 [facility]  . LOWER EXTREMITY ANGIOGRAPHY Left 02/09/2019   Procedure: LOWER EXTREMITY ANGIOGRAPHY;  Surgeon: Katha Cabal, MD;  Location: [facility];  Service: Cardiovascular;  Laterality: Left;  . LOWER EXTREMITY ANGIOGRAPHY Left 03/10/2019   Procedure: LOWER EXTREMITY ANGIOGRAPHY;  Surgeon: Katha Cabal, MD;  Location: [facility];  Service: Cardiovascular;  Laterality: Left;  . LOWER EXTREMITY ANGIOGRAPHY Left 04/27/2019   Procedure: LOWER EXTREMITY ANGIOGRAPHY;  Surgeon: Katha Cabal, MD;  Location: [facility];  Service: Cardiovascular;  Laterality: Left;  . ORIF TOE FRACTURE Right 02/08/2014  Procedure: OPEN REDUCTION INTERNAL FIXATION Right METATARSAL  FRACTURE ;  Surgeon: Wylene Simmer, MD;  Location: [facility];  Service: Orthopedics;  Laterality: Right;  . PERIPHERAL VASCULAR CATHETERIZATION N/A 12/28/2015   Procedure: Abdominal Aortogram w/Lower Extremity;  Surgeon: Conrad Sherrodsville, MD;  Location: [facility];  Service: Cardiovascular;  Laterality: N/A;  . RADIOLOGY WITH ANESTHESIA N/A 03/01/2016   Procedure: RADIOLOGY WITH ANESTHESIA;  Surgeon: [name], MD;  Location: [facility];  Service: Radiology;  Laterality: N/A;  . VEIN HARVEST  Right 02/28/2016   Procedure: RIGHT GREATER SAPHENOUS VEIN HARVEST;  Surgeon: Conrad Yakima, MD;  Location: [facility];  Service: Vascular;  Laterality: Right;     SOCIAL HISTORY:   Social History   Tobacco Use  . Smoking status: Never Smoker  . Smokeless tobacco: Never Used  . Tobacco comment: Never smoked  Substance Use Topics  . Alcohol use: No    Alcohol/week: 0.0 standard drinks     FAMILY HISTORY:   Family History  Problem Relation Age of Onset  . Diabetes Other   . Liver disease Mother   . CVA Father   . Diabetes Father   . Hypertension Father      DRUG ALLERGIES:   Allergies  Allergen Reactions  . Iodine Anaphylaxis  . Penicillins Anaphylaxis    Tolerates ceftriaxone, cefazolin Did it involve swelling of the face/tongue/throat, SOB, or low BP? Unknown Did it involve sudden or severe rash/hives, skin peeling, or any reaction on the inside of your mouth or nose? Unknown Did you need to seek medical attention at a hospital or doctor's office? Unknown When did it last happen?Unknown If all above answers are "NO", may proceed with cephalosporin use.   . Shellfish Allergy Anaphylaxis  . Sulfa Antibiotics Anaphylaxis  . Sulfacetamide Sodium Anaphylaxis  . Sulfasalazine Anaphylaxis  . Eggs Or Egg-Derived Products     Other reaction(s): SHORTNESS OF BREATH  . Morphine And Related Other (See Comments)    Altered mental status    MEDICATIONS AT HOME:   Prior to Admission medications   Medication Sig Start Date End Date Taking? Authorizing Provider  acetaminophen (TYLENOL) 500 MG tablet Take 1,000 mg by mouth every 6 (six) hours as needed for mild pain.    [provider]  albuterol (PROVENTIL HFA;VENTOLIN HFA) 108 (90 Base) MCG/ACT inhaler Inhale 2 puffs into the lungs every 6 (six) hours as needed for wheezing or shortness of breath.     [provider]  amLODipine (NORVASC) 10 MG tablet Take 10 mg by mouth daily.    [provider]   aspirin EC 81 MG EC tablet Take 1 tablet (81 mg total) by mouth daily. 04/29/19   Stegmayer, Joelene Millin A, PA-C  atorvastatin (LIPITOR) 20 MG tablet Take 1 tablet (20 mg total) by mouth daily. Patient taking differently: Take 20 mg by mouth at bedtime.  03/13/16   Virgina Jock A, PA-C  budesonide (PULMICORT) 0.25 MG/2ML nebulizer solution Take 2 mLs (0.25 mg total) by nebulization 2 (two) times daily. 06/10/18   Dustin Flock, MD  carvedilol (COREG) 6.25 MG tablet Take 6.25 mg by mouth 2 (two) times daily with a meal.    [provider]  cetirizine (ZYRTEC) 10 MG tablet Take 10 mg by mouth daily.     [provider]  Cholecalciferol (VITAMIN D) 50 MCG (2000 UT) CAPS Take 2,000 Units by mouth daily.     [provider]  clopidogrel (PLAVIX) 75 MG tablet Take  1 tablet (75 mg total) by mouth daily with breakfast. 03/04/17   Conrad Burton, MD  docusate sodium (COLACE) 100 MG capsule Take 100 mg by mouth 2 (two) times daily.    [provider]  fluticasone (FLONASE) 50 MCG/ACT nasal spray Place 1 spray into both nostrils daily as needed for allergies.     [provider]  gabapentin (NEURONTIN) 300 MG capsule Take 300 mg by mouth every evening.  12/07/18   [provider]  hydrALAZINE (APRESOLINE) 25 MG tablet Take 1 tablet (25 mg total) by mouth every 8 (eight) hours. 12/28/18   Fritzi Mandes, MD  insulin glargine (LANTUS) 100 UNIT/ML injection Inject 0.15 mLs (15 Units total) into the skin at bedtime. 12/28/18   Fritzi Mandes, MD  ipratropium-albuterol (DUONEB) 0.5-2.5 (3) MG/3ML SOLN Take 3 mLs by nebulization 2 (two) times daily. 06/10/18   Dustin Flock, MD  isosorbide dinitrate (ISORDIL) 30 MG tablet Take 30 mg by mouth daily.    [provider]  magnesium oxide (MAG-OX) 400 MG tablet Take 400 mg by mouth 2 (two) times daily.     [provider]  modafinil (PROVIGIL) 200 MG tablet Take 200 mg by mouth daily.    [provider]   nitroGLYCERIN (NITROSTAT) 0.4 MG SL tablet Place 1 tablet (0.4 mg total) under the tongue every 5 (five) minutes as needed for chest pain. 07/09/16   Holley Raring, MD  omeprazole (PRILOSEC) 20 MG capsule Take 1 capsule (20 mg total) by mouth daily. 04/17/18   Henreitta Leber, MD  OXYGEN Inhale 2 L into the lungs daily as needed (short of breath).    [provider]  polyethylene glycol (MIRALAX / GLYCOLAX) packet Take 17 g by mouth 2 (two) times daily. Patient taking differently: Take 17 g by mouth daily as needed for moderate constipation.  02/09/14   Thurnell Lose, MD  potassium chloride SA (K-DUR,KLOR-CON) 20 MEQ tablet Take 20 mEq by mouth daily.    [provider]  pregabalin (LYRICA) 50 MG capsule Take 50 mg by mouth 2 (two) times daily.    [provider]  torsemide (DEMADEX) 20 MG tablet Take 1 tablet (20 mg total) by mouth 2 (two) times daily. 12/28/18   Fritzi Mandes, MD  Vitamin E 180 MG CAPS Take 180 mg by mouth daily.    [provider]    REVIEW OF SYSTEMS:  Review of Systems  Constitutional: Negative for chills, fever, malaise/fatigue and weight loss.  HENT: Negative for ear pain, hearing loss and tinnitus.   Eyes: Negative for blurred vision, double vision, pain and redness.  Respiratory: Negative for cough, hemoptysis and shortness of breath.   Cardiovascular: Negative for chest pain, palpitations, orthopnea and leg swelling.  Gastrointestinal: Negative for abdominal pain, constipation, diarrhea, nausea and vomiting.  Genitourinary: Positive for dysuria. Negative for frequency and hematuria.  Musculoskeletal: Negative for back pain, joint pain and neck pain.  Skin:       No acne, rash, or lesions  Neurological: Negative for dizziness, tremors, focal weakness and weakness.  Endo/Heme/Allergies: Negative for polydipsia. Does not bruise/bleed easily.  Psychiatric/Behavioral: Negative for depression. The patient is not nervous/anxious and  does not have insomnia.      VITAL SIGNS:   Vitals:   06/02/19 1840 06/02/19 1843  BP: (!) 152/55   Pulse: 82   Resp: 18   Temp: 98 F (36.7 C)   TempSrc: Oral   SpO2: 97%   Weight:  73.9 kg  Height:  5\' 4"  (1.626 m)   Wt Readings from Last 3 Encounters:  06/02/19 73.9 kg  02/09/19 81.6 kg  12/23/18 77.6 kg    PHYSICAL EXAMINATION:  Physical Exam  Vitals reviewed. Constitutional: She is oriented to person, place, and time. She appears well-developed and well-nourished. No distress.  HENT:  Head: Normocephalic and atraumatic.  Mouth/Throat: Oropharynx is clear and moist.  Eyes: Pupils are equal, round, and reactive to light. Conjunctivae and EOM are normal. No scleral icterus.  Neck: Normal range of motion. Neck supple. No JVD present. No thyromegaly present.  Cardiovascular: Normal rate, regular rhythm and intact distal pulses. Exam reveals no gallop and no friction rub.  No murmur heard. Respiratory: Effort normal and breath sounds normal. No respiratory distress. She has no wheezes. She has no rales.  GI: Soft. Bowel sounds are normal. She exhibits no distension. There is no abdominal tenderness.  Musculoskeletal: Normal range of motion.        General: No edema.     Comments: No arthritis, no gout  Lymphadenopathy:    She has no cervical adenopathy.  Neurological: She is alert and oriented to person, place, and time. No cranial nerve deficit.  No dysarthria, no aphasia  Skin: Skin is warm and dry. No rash noted. No erythema.  Psychiatric: She has a normal mood and affect. Her behavior is normal. Judgment and thought content normal.    LABORATORY PANEL:   CBC No results for input(s): WBC, HGB, HCT, PLT in the last 168 hours. ------------------------------------------------------------------------------------------------------------------  Chemistries  No results for input(s): NA, K, CL, CO2, GLUCOSE, BUN, CREATININE, CALCIUM, MG, AST, ALT, ALKPHOS, BILITOT in  the last 168 hours.  Invalid input(s): GFRCGP ------------------------------------------------------------------------------------------------------------------  Cardiac Enzymes No results for input(s): TROPONINI in the last 168 hours. ------------------------------------------------------------------------------------------------------------------  RADIOLOGY:  No results found.  EKG:   Orders placed or performed during the hospital encounter of 12/23/18  . ED EKG  . ED EKG  . EKG 12-Lead  . EKG 12-Lead    IMPRESSION AND PLAN:  Principal Problem:   Recurrent UTI -patient has a history of ESBL E. coli UTI which is only sensitive to gentamicin, Macrobid, imipenem.  We will start her on meropenem tonight, and send repeat culture.  Patient could likely benefit from urology consult Active Problems:   HTN (hypertension) -home dose antihypertensives   DM2 (diabetes mellitus, type 2) (HCC) -sliding scale insulin   Chronic diastolic CHF (congestive heart failure) ([place]) -continue home meds   PVD (peripheral vascular disease) ([place]) -continue home medications   HLD (hyperlipidemia) -home dose antilipid  Chart review performed and case discussed with ED provider. Labs, imaging and/or ECG reviewed by provider and discussed with patient/family. Management plans discussed with the patient and/or family.  COVID-19 status: Pending  DVT PROPHYLAXIS: SubQ lovenox   GI PROPHYLAXIS:  PPI   ADMISSION STATUS: Inpatient     CODE STATUS: Full Code Status History    Date Active Date Inactive Code Status Order ID Comments User Context   04/27/2019 1551 04/28/2019 1947 Full Code [id number]  Leonides Schanz Inpatient   02/09/2019 1407 02/11/2019 0540 Full Code [id number]  Katha Cabal, MD Inpatient   12/28/2018 0746 12/28/2018 1847 Full Code [id number]  Jesse Sans, RN Inpatient   06/03/2018 1511 06/10/2018 2216 Full Code [id number]  Vaughan Basta, MD ED   04/10/2018 1437  04/17/2018 1946 Full Code [id number]  Loletha Grayer, MD ED   08/29/2016 0516 09/17/2016 1938 Full  Code [id number]  Etta Quill, DO ED   07/05/2016 1755 07/09/2016 2131 Full Code [id number]  Shela Leff, MD Inpatient   03/01/2016 1524 03/13/2016 2119 Full Code [id number]  [name], MD Inpatient   03/01/2016 1450 03/01/2016 1524 Full Code [id number]  Estill Bakes Inpatient   03/01/2016 1104 03/01/2016 1450 Full Code [id number]  Roland Earl, MD Inpatient   02/28/2016 1915 03/01/2016 1104 Full Code [id number]  Orbie Hurst Inpatient   02/12/2014 0708 02/13/2014 1850 Full Code [id number]  Oswald Hillock, MD Inpatient   02/08/2014 1317 02/10/2014 1758 Full Code [id number]  Wylene Simmer, MD Inpatient   02/03/2014 1459 02/08/2014 1317 Full Code [id number]  Radene Gunning, NP Inpatient   Advance Care Planning Activity    Advance Directive Documentation     Most Recent Value  Type of Advance Directive  Healthcare Power of Attorney  Pre-existing out of facility DNR order (yellow form or pink MOST form)  -  "MOST" Form in Place?  -      TOTAL TIME TAKING CARE OF THIS PATIENT: 45 minutes.   This patient was evaluated in the context of the global COVID-19 pandemic, which necessitated consideration that the patient might be at risk for infection with the SARS-CoV-2 virus that causes COVID-19. Institutional protocols and algorithms that pertain to the evaluation of patients at risk for COVID-19 are in a state of rapid change based on information released by regulatory bodies including the CDC and federal and state organizations. These policies and algorithms were followed to the best of this provider's knowledge to date during the patient's care at this facility.  Ethlyn Daniels 06/02/2019, 8:46 PM  [facility]  Office  [phone]  CC: Primary care physician; [name], MD  Note:  This document was prepared using Dragon voice recognition software and may include  unintentional dictation errors.

## 2019-06-02 NOTE — ED Notes (Signed)
2nd lab tech at bedside to draw labs.

## 2019-06-02 NOTE — ED Notes (Signed)
Patient's husband is at the bedside in the hallway. Husband states patient was here for a PICC line insertion for home antibiotic infusion.

## 2019-06-02 NOTE — ED Notes (Addendum)
Lab at bedside. Dr. Jannifer Franklin at bedside. Patient given ginger ale and applesauce to eat after Covid test. Patient refused [place] sandwich. Husband at bedside.

## 2019-06-02 NOTE — Consult Note (Signed)
Pharmacy Antibiotic Note  [name] is a 78 y.o. female admitted on 06/02/2019 with UTI.  Pharmacy has been consulted for Meropenem dosing due to her history of ESBL UTI.  Plan: Meropenem 1g Q8 hours  Height: 5\' 4"  (162.6 cm) Weight: 163 lb (73.9 kg) IBW/kg (Calculated) : 54.7  Temp (24hrs), Avg:98 F (36.7 C), Min:98 F (36.7 C), Max:98 F (36.7 C)  Recent Labs  Lab 06/02/19 2149  WBC 12.8*    CrCl cannot be calculated (Patient's most recent lab result is older than the maximum 21 days allowed.).    Allergies  Allergen Reactions  . Iodine Anaphylaxis  . Penicillins Anaphylaxis    Tolerates ceftriaxone, cefazolin Did it involve swelling of the face/tongue/throat, SOB, or low BP? Unknown Did it involve sudden or severe rash/hives, skin peeling, or any reaction on the inside of your mouth or nose? Unknown Did you need to seek medical attention at a hospital or doctor's office? Unknown When did it last happen?Unknown If all above answers are "NO", may proceed with cephalosporin use.   . Shellfish Allergy Anaphylaxis  . Sulfa Antibiotics Anaphylaxis  . Sulfacetamide Sodium Anaphylaxis  . Sulfasalazine Anaphylaxis  . Eggs Or Egg-Derived Products     Other reaction(s): SHORTNESS OF BREATH  . Morphine And Related Other (See Comments)    Altered mental status    Antimicrobials this admission: Meropenem 10/28 >>     Microbiology results: 10/28 UCx: pending    Thank you for allowing pharmacy to be a part of this patient's care.  Thresia Ramanathan A [place]le [place] 06/02/2019 10:04 PM

## 2019-06-02 NOTE — ED Notes (Signed)
Patient taken to a room for in and out cath.

## 2019-06-02 NOTE — ED Notes (Signed)
IV team was able to get an IV, but no labs. Lab was called for blood draw.

## 2019-06-03 ENCOUNTER — Other Ambulatory Visit (INDEPENDENT_AMBULATORY_CARE_PROVIDER_SITE_OTHER): Payer: Self-pay | Admitting: Vascular Surgery

## 2019-06-03 ENCOUNTER — Other Ambulatory Visit: Payer: Self-pay

## 2019-06-03 DIAGNOSIS — Z885 Allergy status to narcotic agent status: Secondary | ICD-10-CM

## 2019-06-03 DIAGNOSIS — Z91013 Allergy to seafood: Secondary | ICD-10-CM

## 2019-06-03 DIAGNOSIS — Z95820 Peripheral vascular angioplasty status with implants and grafts: Secondary | ICD-10-CM

## 2019-06-03 DIAGNOSIS — I110 Hypertensive heart disease with heart failure: Secondary | ICD-10-CM | POA: Diagnosis not present

## 2019-06-03 DIAGNOSIS — I350 Nonrheumatic aortic (valve) stenosis: Secondary | ICD-10-CM

## 2019-06-03 DIAGNOSIS — Z881 Allergy status to other antibiotic agents status: Secondary | ICD-10-CM

## 2019-06-03 DIAGNOSIS — Z888 Allergy status to other drugs, medicaments and biological substances status: Secondary | ICD-10-CM

## 2019-06-03 DIAGNOSIS — R011 Cardiac murmur, unspecified: Secondary | ICD-10-CM

## 2019-06-03 DIAGNOSIS — E1151 Type 2 diabetes mellitus with diabetic peripheral angiopathy without gangrene: Secondary | ICD-10-CM | POA: Diagnosis not present

## 2019-06-03 DIAGNOSIS — Z79899 Other long term (current) drug therapy: Secondary | ICD-10-CM

## 2019-06-03 DIAGNOSIS — Z880 Allergy status to penicillin: Secondary | ICD-10-CM

## 2019-06-03 DIAGNOSIS — Z91041 Radiographic dye allergy status: Secondary | ICD-10-CM

## 2019-06-03 DIAGNOSIS — N390 Urinary tract infection, site not specified: Secondary | ICD-10-CM

## 2019-06-03 DIAGNOSIS — Z87440 Personal history of urinary (tract) infections: Secondary | ICD-10-CM

## 2019-06-03 DIAGNOSIS — I70203 Unspecified atherosclerosis of native arteries of extremities, bilateral legs: Secondary | ICD-10-CM

## 2019-06-03 DIAGNOSIS — Z91012 Allergy to eggs: Secondary | ICD-10-CM

## 2019-06-03 DIAGNOSIS — Z7401 Bed confinement status: Secondary | ICD-10-CM

## 2019-06-03 DIAGNOSIS — Z7902 Long term (current) use of antithrombotics/antiplatelets: Secondary | ICD-10-CM

## 2019-06-03 DIAGNOSIS — Z8619 Personal history of other infectious and parasitic diseases: Secondary | ICD-10-CM

## 2019-06-03 DIAGNOSIS — I509 Heart failure, unspecified: Secondary | ICD-10-CM

## 2019-06-03 LAB — CBC
HCT: 34.1 % — ABNORMAL LOW (ref 36.0–46.0)
Hemoglobin: 11 g/dL — ABNORMAL LOW (ref 12.0–15.0)
MCH: 26 pg (ref 26.0–34.0)
MCHC: 32.3 g/dL (ref 30.0–36.0)
MCV: 80.6 fL (ref 80.0–100.0)
Platelets: 286 10*3/uL (ref 150–400)
RBC: 4.23 MIL/uL (ref 3.87–5.11)
RDW: 14.9 % (ref 11.5–15.5)
WBC: 11 10*3/uL — ABNORMAL HIGH (ref 4.0–10.5)
nRBC: 0 % (ref 0.0–0.2)

## 2019-06-03 LAB — GLUCOSE, CAPILLARY
Glucose-Capillary: 147 mg/dL — ABNORMAL HIGH (ref 70–99)
Glucose-Capillary: 130 mg/dL — ABNORMAL HIGH (ref 70–99)
Glucose-Capillary: 147 mg/dL — ABNORMAL HIGH (ref 70–99)
Glucose-Capillary: 216 mg/dL — ABNORMAL HIGH (ref 70–99)
Glucose-Capillary: 206 mg/dL — ABNORMAL HIGH (ref 70–99)

## 2019-06-03 LAB — BASIC METABOLIC PANEL
Anion gap: 9 (ref 5–15)
BUN: 15 mg/dL (ref 8–23)
CO2: 25 mmol/L (ref 22–32)
Calcium: 9.3 mg/dL (ref 8.9–10.3)
Chloride: 104 mmol/L (ref 98–111)
Creatinine, Ser: 0.61 mg/dL (ref 0.44–1.00)
GFR calc Af Amer: >60 mL/min (ref >60)
GFR calc non Af Amer: >60 mL/min (ref >60)
Glucose, Bld: 171 mg/dL — ABNORMAL HIGH (ref 70–99)
Potassium: 3.6 mmol/L (ref 3.5–5.1)
Sodium: 138 mmol/L (ref 135–145)

## 2019-06-03 LAB — MRSA PCR SCREENING: MRSA by PCR: POSITIVE — AB

## 2019-06-03 LAB — SARS CORONAVIRUS 2 (TAT 6-24 HRS): SARS Coronavirus 2: NEGATIVE

## 2019-06-03 MED ORDER — CLINDAMYCIN PHOSPHATE 300 MG/50ML IV SOLN
300.0000 mg | Freq: Once | INTRAVENOUS | Status: DC
  Filled 2019-06-03: qty 50

## 2019-06-03 MED ORDER — NITROGLYCERIN 0.4 MG SL SUBL: 0.4000 mg | SUBLINGUAL_TABLET | SUBLINGUAL | Status: DC | PRN

## 2019-06-03 MED ORDER — HYDRALAZINE HCL 50 MG PO TABS
25.0000 mg | ORAL_TABLET | Freq: Three times a day (TID) | ORAL | Status: DC
  Administered 2019-06-03 – 2019-06-11 (×18): 25 mg via ORAL
  Filled 2019-06-03 (×21): qty 1

## 2019-06-03 MED ORDER — VITAMIN D3 25 MCG (1000 UNIT) PO TABS
2000.0000 [IU] | ORAL_TABLET | Freq: Every day | ORAL | Status: DC
  Administered 2019-06-03 – 2019-06-11 (×7): 2000 [IU] via ORAL
  Filled 2019-06-03 (×16): qty 2

## 2019-06-03 MED ORDER — POLYETHYLENE GLYCOL 3350 17 G PO PACK
17.0000 g | PACK | Freq: Every day | ORAL | Status: DC | PRN
  Administered 2019-06-05 – 2019-06-07 (×2): 17 g via ORAL
  Filled 2019-06-03 (×2): qty 1

## 2019-06-03 MED ORDER — MODAFINIL 100 MG PO TABS
200.0000 mg | ORAL_TABLET | Freq: Every day | ORAL | Status: DC
  Administered 2019-06-03 – 2019-06-11 (×7): 200 mg via ORAL
  Filled 2019-06-03 (×7): qty 2

## 2019-06-03 MED ORDER — BUDESONIDE 0.25 MG/2ML IN SUSP
0.2500 mg | Freq: Two times a day (BID) | RESPIRATORY_TRACT | Status: DC
  Administered 2019-06-03: 0.25 mg via RESPIRATORY_TRACT
  Filled 2019-06-03: qty 2

## 2019-06-03 MED ORDER — DOCUSATE SODIUM 100 MG PO CAPS
100.0000 mg | ORAL_CAPSULE | Freq: Two times a day (BID) | ORAL | Status: DC
  Administered 2019-06-03 – 2019-06-11 (×15): 100 mg via ORAL
  Filled 2019-06-03 (×17): qty 1

## 2019-06-03 MED ORDER — CLOPIDOGREL BISULFATE 75 MG PO TABS
75.0000 mg | ORAL_TABLET | Freq: Every day | ORAL | Status: DC
  Administered 2019-06-05 – 2019-06-11 (×5): 75 mg via ORAL
  Filled 2019-06-03 (×5): qty 1

## 2019-06-03 MED ORDER — GABAPENTIN 300 MG PO CAPS
300.0000 mg | ORAL_CAPSULE | Freq: Every evening | ORAL | Status: DC
  Administered 2019-06-03 – 2019-06-10 (×8): 300 mg via ORAL
  Filled 2019-06-03 (×8): qty 1

## 2019-06-03 MED ORDER — ALBUTEROL SULFATE (2.5 MG/3ML) 0.083% IN NEBU
2.5000 mg | INHALATION_SOLUTION | Freq: Four times a day (QID) | RESPIRATORY_TRACT | Status: DC | PRN
  Filled 2019-06-03: qty 3

## 2019-06-03 MED ORDER — MUPIROCIN 2 % EX OINT
1.0000 "application " | TOPICAL_OINTMENT | Freq: Two times a day (BID) | CUTANEOUS | Status: AC
  Administered 2019-06-03 – 2019-06-07 (×9): 1 via NASAL
  Filled 2019-06-03: qty 22

## 2019-06-03 MED ORDER — ACETAMINOPHEN 500 MG PO TABS: 1000.0000 mg | ORAL_TABLET | Freq: Four times a day (QID) | ORAL | Status: DC | PRN

## 2019-06-03 MED ORDER — IPRATROPIUM-ALBUTEROL 0.5-2.5 (3) MG/3ML IN SOLN: 3.0000 mL | Freq: Two times a day (BID) | RESPIRATORY_TRACT | Status: DC

## 2019-06-03 MED ORDER — TORSEMIDE 20 MG PO TABS
20.0000 mg | ORAL_TABLET | Freq: Two times a day (BID) | ORAL | Status: DC
  Administered 2019-06-03 – 2019-06-11 (×13): 20 mg via ORAL
  Filled 2019-06-03 (×13): qty 1

## 2019-06-03 MED ORDER — BUDESONIDE 0.25 MG/2ML IN SUSP: 0.2500 mg | Freq: Two times a day (BID) | RESPIRATORY_TRACT | Status: DC

## 2019-06-03 MED ORDER — ISOSORBIDE DINITRATE 30 MG PO TABS
30.0000 mg | ORAL_TABLET | Freq: Every day | ORAL | Status: DC
  Administered 2019-06-03 – 2019-06-11 (×7): 30 mg via ORAL
  Filled 2019-06-03 (×10): qty 1

## 2019-06-03 MED ORDER — VITAMIN E 180 MG (400 UNIT) PO CAPS
400.0000 [IU] | ORAL_CAPSULE | Freq: Every day | ORAL | Status: DC
  Administered 2019-06-03 – 2019-06-11 (×7): 400 [IU] via ORAL
  Filled 2019-06-03 (×9): qty 1

## 2019-06-03 MED ORDER — MAGNESIUM OXIDE 400 (241.3 MG) MG PO TABS
400.0000 mg | ORAL_TABLET | Freq: Two times a day (BID) | ORAL | Status: DC
  Administered 2019-06-03 – 2019-06-11 (×15): 400 mg via ORAL
  Filled 2019-06-03 (×25): qty 1

## 2019-06-03 MED ORDER — FLUTICASONE PROPIONATE 50 MCG/ACT NA SUSP
1.0000 | Freq: Every day | NASAL | Status: DC | PRN
  Filled 2019-06-03: qty 16

## 2019-06-03 MED ORDER — SODIUM CHLORIDE 0.9 % IV SOLN
INTRAVENOUS | Status: DC
  Administered 2019-06-03 – 2019-06-06 (×5): via INTRAVENOUS

## 2019-06-03 MED ORDER — INSULIN GLARGINE 100 UNIT/ML ~~LOC~~ SOLN
15.0000 [IU] | Freq: Every day | SUBCUTANEOUS | Status: DC
  Administered 2019-06-03 – 2019-06-10 (×8): 15 [IU] via SUBCUTANEOUS
  Filled 2019-06-03 (×9): qty 0.15

## 2019-06-03 MED ORDER — PREGABALIN 50 MG PO CAPS
50.0000 mg | ORAL_CAPSULE | Freq: Two times a day (BID) | ORAL | Status: DC
  Administered 2019-06-03 – 2019-06-11 (×15): 50 mg via ORAL
  Filled 2019-06-03 (×15): qty 1

## 2019-06-03 MED ORDER — POTASSIUM CHLORIDE CRYS ER 20 MEQ PO TBCR
20.0000 meq | EXTENDED_RELEASE_TABLET | Freq: Every day | ORAL | Status: DC
  Administered 2019-06-03 – 2019-06-11 (×7): 20 meq via ORAL
  Filled 2019-06-03 (×7): qty 1

## 2019-06-03 MED ORDER — IPRATROPIUM-ALBUTEROL 0.5-2.5 (3) MG/3ML IN SOLN
3.0000 mL | Freq: Two times a day (BID) | RESPIRATORY_TRACT | Status: DC
  Administered 2019-06-03: 20:00:00 3 mL via RESPIRATORY_TRACT
  Filled 2019-06-03: qty 3

## 2019-06-03 MED ORDER — CHLORHEXIDINE GLUCONATE CLOTH 2 % EX PADS
6.0000 | MEDICATED_PAD | Freq: Every day | CUTANEOUS | Status: DC
  Administered 2019-06-03 – 2019-06-11 (×6): 6 via TOPICAL

## 2019-06-03 MED ORDER — LORATADINE 10 MG PO TABS
10.0000 mg | ORAL_TABLET | Freq: Every day | ORAL | Status: DC
  Administered 2019-06-03 – 2019-06-11 (×7): 10 mg via ORAL
  Filled 2019-06-03 (×7): qty 1

## 2019-06-03 NOTE — Progress Notes (Signed)
[name] NAME: [name]    MR#:  [id number]  DATE OF BIRTH:  02-10-1941  SUBJECTIVE:  CHIEF COMPLAINT:   Chief Complaint  Patient presents with  . Recurrent UTI    REVIEW OF SYSTEMS:  ROS Constitutional: Negative for chills, fever, malaise/fatigue and weight loss.  HENT: Negative for ear pain, hearing loss and tinnitus.   Eyes: Negative for blurred vision, double vision, pain and redness.  Respiratory: Negative for cough, hemoptysis and shortness of breath.   Cardiovascular: Negative for chest pain, palpitations, orthopnea and leg swelling.  Gastrointestinal: Negative for abdominal pain, constipation, diarrhea, nausea and vomiting.  Genitourinary: Positive for dysuria. Negative for frequency and hematuria.  Musculoskeletal: Negative for back pain, joint pain and neck pain.  Skin:       No acne, rash, or lesions  Neurological: Negative for dizziness, tremors, focal weakness and weakness.  Endo/Heme/Allergies: Negative for polydipsia. Does not bruise/bleed easily.  Psychiatric/Behavioral: Negative for depression. The patient is not nervous/anxious and does not have insomnia.   DRUG ALLERGIES:   Allergies  Allergen Reactions  . Iodine Anaphylaxis  . Penicillins Anaphylaxis    Tolerates ceftriaxone, cefazolin Did it involve swelling of the face/tongue/throat, SOB, or low BP? Unknown Did it involve sudden or severe rash/hives, skin peeling, or any reaction on the inside of your mouth or nose? Unknown Did you need to seek medical attention at a hospital or doctor's office? Unknown When did it last happen?Unknown If all above answers are "NO", may proceed with cephalosporin use.   . Shellfish Allergy Anaphylaxis  . Sulfa Antibiotics Anaphylaxis  . Sulfacetamide Sodium Anaphylaxis  . Sulfasalazine Anaphylaxis  . Eggs Or Egg-Derived Products     Other reaction(s): SHORTNESS OF BREATH  . Morphine And Related Other  (See Comments)    Altered mental status   VITALS:  Blood pressure (!) 155/97, pulse 89, temperature 98.3 F (36.8 C), temperature source Oral, resp. rate 18, height 5\' 4"  (1.626 m), weight 73.9 kg, SpO2 100 %. PHYSICAL EXAMINATION:  Physical Exam  Constitutional: She is oriented to person, place, and time and well-developed, well-nourished, and in no distress.  HENT:  Head: Normocephalic and atraumatic.  Eyes: Pupils are equal, round, and reactive to light. Conjunctivae are normal. Right eye exhibits no discharge.  Neck: Normal range of motion. Neck supple. No thyromegaly present.  Cardiovascular: Normal rate, regular rhythm and normal heart sounds.  Pulmonary/Chest: Effort normal and breath sounds normal. No respiratory distress.  Abdominal: Soft. Bowel sounds are normal. She exhibits no distension.  Musculoskeletal: Normal range of motion.     Comments: Gangrenous toes on both lower extremity as evidenced on the pictures below.  Neurological: She is alert and oriented to person, place, and time.  Generalized weakness  Skin: Skin is warm. She is not diaphoretic.  Gangrenous toes on both lower extremity as evidenced on the pictures below.  Psychiatric: Affect and judgment normal.         LABORATORY PANEL:  Female CBC Recent Labs  Lab 06/03/19 0451  WBC 11.0*  HGB 11.0*  HCT 34.1*  PLT 286   ------------------------------------------------------------------------------------------------------------------ Chemistries  Recent Labs  Lab 06/02/19 2149 06/03/19 0451  NA 139 138  K 4.1 3.6  CL 102 104  CO2 23 25  GLUCOSE 178* 171*  BUN 12 15  CREATININE 0.64 0.61  CALCIUM 9.6 9.3  AST 18  --   ALT 18  --   ALKPHOS 70  --  BILITOT 0.5  --    RADIOLOGY:  No results found. ASSESSMENT AND PLAN:   1.  Recurrent UTI -patient has a history of ESBL E. coli UTI which is only sensitive to gentamicin, Macrobid, imipenem.  Patient was sent to the hospital from primary care  physician for recurrent UTI.  Patient already started on IV meropenem pending results of urine culture and sensitivities Patient is incontinent of urine.  Denies any fevers or dysuria. There is concern about possibility of asymptomatic bacteriuria.  Will request for infectious disease specialist input  2. HTN (hypertension)  Blood pressure control improving.  Continue Norvasc and Coreg   3  DM2 (diabetes mellitus, type 2) (HCC) -sliding scale insulin  4.  Chronic diastolic CHF (congestive heart failure) ; Stable -continue home meds  5.   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 Vascular surgery consulted. Plans for angiogram tomorrow  6.  HLD (hyperlipidemia) -home dose antilipid  DVT prophylaxis; Lovenox  All the records are reviewed and case discussed with Care Management/Social Worker. Management plans discussed with the patient, family and they are in agreement. Called and updated husband on treatment plans as outlined.  CODE STATUS: Full Code  TOTAL TIME TAKING CARE OF THIS PATIENT: 35 minutes.   More than 50% of the time was spent in counseling/coordination of care: YES  POSSIBLE D/C IN 2 DAYS, DEPENDING ON CLINICAL CONDITION.   [name] M.D on 06/03/2019 at 1:09 PM  Between 7am to 6pm - Pager - [phone]  After 6pm go to [email or web address] - [facility]  Office  [phone]  CC: Primary care physician; [name] Richards, MD  Note: This dictation was prepared with Dragon dictation along with smaller phrase technology. Any transcriptional errors that result from this process are unintentional.

## 2019-06-03 NOTE — TOC Initial Note (Signed)
Transition of Care [facility]) - Initial/Assessment Note    Patient Details  Name: [name] MRN: [id number] Date of Birth: [birth year]  Transition of Care [facility] CM/SW Contact:    Shelbie Hutching, RN Phone Number: 06/03/2019, 9:26 AM  Clinical Narrative:                 RNCM spoke with patient's husband via phone, introduced self and explained role of case manager.   Patient is admitted with recurrent UTI, patient has needed IV antibiotics in the past  Patient lives at home with her husband, she is bedbound, husband uses hoyer lift to transfer to wheelchair.  [facility] [employer] takes patient to her appointments.  Patient has all needed equipment at home- hospital bed, lift, O2, BSC, shower seat, trilogy, WC, and RW.  Patient has had [place] in the past.  Expected Discharge Plan: [place] Barriers to Discharge: Continued Medical Work up   Patient Goals and CMS Choice   CMS Medicare.gov Compare Post Acute Care list provided to:: Patient Choice offered to / list presented to : Patient  Expected Discharge Plan and Services Expected Discharge Plan: [place]   Discharge Planning Services: CM Consult Post Acute Care Choice: Resumption of Svcs/PTA Provider Living arrangements for the past 2 months: Single Family Home                                      Prior Living Arrangements/Services Living arrangements for the past 2 months: Single Family Home Lives with:: Spouse Patient language and need for interpreter reviewed:: No Do you feel safe going back to the place where you live?: Yes      Need for Family Participation in Patient Care: Yes (Comment)(bedbound) Care giver support system in place?: Yes (comment)(husband) Current home services: DME(hospital bed, hoyer lift, wheelchair, oxygen) Criminal Activity/Legal Involvement Pertinent to Current Situation/Hospitalization: No - Comment as needed  Activities of Daily  Living   ADL Screening (condition at time of admission) Patient's cognitive ability adequate to safely complete daily activities?: Yes Is the patient deaf or have difficulty hearing?: Yes Does the patient have difficulty seeing, even when wearing glasses/contacts?: No Does the patient have difficulty concentrating, remembering, or making decisions?: Yes Patient able to express need for assistance with ADLs?: Yes Does the patient have difficulty dressing or bathing?: Yes Independently performs ADLs?: No Communication: Independent Dressing (OT): Needs assistance Is this a change from baseline?: Pre-admission baseline Grooming: Needs assistance Is this a change from baseline?: Pre-admission baseline Feeding: Independent Bathing: Dependent Is this a change from baseline?: Pre-admission baseline Toileting: Dependent Is this a change from baseline?: Pre-admission baseline In/Out Bed: Dependent Is this a change from baseline?: Pre-admission baseline Walks in Home: Dependent Is this a change from baseline?: Pre-admission baseline Does the patient have difficulty walking or climbing stairs?: Yes Weakness of Legs: Both Weakness of Arms/Hands: None  Permission Sought/Granted Permission sought to share information with : Case Manager, Family Supports Permission granted to share information with : Yes, Verbal Permission Granted  Share Information with NAME: [name]- husband           Emotional Assessment Appearance:: Appears stated age Attitude/Demeanor/Rapport: Engaged Affect (typically observed): Accepting Orientation: : Oriented to Self, Oriented to Place, Oriented to Situation Alcohol / Substance Use: Not Applicable Psych Involvement: No (comment)  Admission diagnosis:  Urinary tract infection without hematuria, site  unspecified [N39.0] Patient Active Problem List   Diagnosis Date Noted  . Recurrent UTI 06/02/2019  . UTI (urinary tract infection) 06/02/2019  . PAD (peripheral  artery disease) ([place]) 04/27/2019  . ESBL (extended spectrum beta-lactamase) producing bacteria infection 12/25/2018  . Healthcare-associated pneumonia 06/03/2018  . Acute cystitis 04/10/2018  . Sepsis due to Streptococcus, group B ([place]) 09/12/2016  . Multiple open wounds of lower leg 09/12/2016  . PVD (peripheral vascular disease) ([place]) 09/12/2016  . Bacteremia 08/31/2016  . Ulcer of right lower extremity ([place]) 08/29/2016  . Sepsis ([place]) 08/29/2016  . Acute on chronic diastolic heart failure ([place]) 07/08/2016  . HTN (hypertension) 07/05/2016  . Chest pain 07/05/2016  . Impacted cerumen of both ears 06/05/2016  . 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 Chronic diastolic CHF (congestive heart failure) ([place]) 03/10/2016  . HLD (hyperlipidemia) 03/09/2016  . Pleural effusion, bilateral 03/09/2016  . Wheeze 03/09/2016  . Dyspnea 03/08/2016  . Right sided weakness 03/05/2016  . Acute respiratory acidosis 03/05/2016  . Surgery, elective 03/04/2016  . Anemia, iron deficiency 03/03/2016  . Leukocytosis 03/02/2016  . Cerebrovascular accident (CVA) due to thrombosis of precerebral artery ([place]) 03/01/2016  . 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 Atherosclerosis of native arteries of the extremities with gangrene ([place]) 12/08/2015  . Open toe wound 05/04/2014  . Anemia, chronic disease 04/27/2014  . Occult blood positive stool 04/17/2014  . Edema 04/03/2014  . Unspecified hereditary and idiopathic peripheral neuropathy 03/14/2014  . DM2 (diabetes mellitus, type 2) ([place]) 02/14/2014  . Altered mental status 02/12/2014  . Acute encephalopathy 02/12/2014  . Aortic stenosis 02/04/2014  . Closed left subtrochanteric femur fracture ([place]) 02/03/2014  . Hip fracture, left ([place]) 02/03/2014  . Hypokalemia 02/03/2014  . Fibula fracture 02/03/2014  . MTP instability 02/03/2014  . Hip fracture ([place]) 02/03/2014   PCP:  [name],  MD Pharmacy:   [facility] [place] [address] [place] [address] Phone: [phone] Fax: [phone]     Social Determinants of Health (SDOH) Interventions    Readmission Risk Interventions Readmission Risk Prevention Plan 12/24/2018  [place] or Home Care Consult Complete  Some recent data might be hidden

## 2019-06-03 NOTE — Progress Notes (Signed)
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  Patient was recently seen by Dr. Delana Meyer in the outpatient setting on May 31, 2019.  Patient with gangrenous changes to the toes on the left foot.  Still complaining of what sounds like left rest pain symptoms.  Patient was admitted to the hospital for recurrent UTI.  The patient is relatively bedbound and does not ambulate.  She denies any fever, nausea vomiting.  Denies any shortness of breath or chest pain.  ABI's Rt=0.69 and Lt=0.47  (previous ABI's Rt=0.52 and Lt=0.87)  Patient was scheduled to undergo left lower extremity angiogram with possible intervention with Dr. Delana Meyer on June 16, 2019.  Since the patient is now in house, we will plan on left lower extremity angiogram tomorrow.  Possible toe amputation vs metatarsal amputation possibly next week.  Vascular was consulted by Dr. Stark Jock for further recommendations  Objective: Vitals:   06/02/19 2205 06/02/19 2232 06/03/19 0443 06/03/19 0938  BP: (!) 158/55 (!) 175/82 (!) 144/62 (!) 155/97  Pulse: 84 83 89   Resp: '18 18 18   ' Temp:  98.4 F (36.9 C) 98.3 F (36.8 C)   TempSrc:  Oral Oral   SpO2: 100% 100% 100%   Weight:      Height:        Intake/Output Summary (Last 24 hours) at 06/03/2019 1357 Last data filed at 06/03/2019 1226 Gross per 24 hour  Intake 200 ml  Output 770 ml  Net -570 ml   Physical Exam: A&Ox3, NAD CV: RRR Pulmonary: CTA Bilaterally Abdomen: Soft, Nontender, Nondistended Vascular:  Right Lower Extremity: Thigh soft.  Calf soft.  Extremity is warm distally to toes.  Unable to palpate pedal pulses however the foot is warm.  Some discoloration noted to the metatarsal/toes however no active ulcerations or gangrene noted.    Left Lower  Extremity:  Thigh soft.  Calf soft.  Extremity is warm distally to toes.  Unable to palpate pedal pulses however the foot is warm.  Some discoloration noted to the metatarsal/toes with gangrenous changes to most toes.    Laboratory: CBC    Component Value Date/Time   WBC 11.0 (H) 06/03/2019 0451   HGB 11.0 (L) 06/03/2019 0451   HCT 34.1 (L) 06/03/2019 0451   PLT 286 06/03/2019 0451   BMET    Component Value Date/Time   NA 138 06/03/2019 0451   K 3.6 06/03/2019 0451   CL 104 06/03/2019 0451   CO2 25 06/03/2019 0451   GLUCOSE 171 (H) 06/03/2019 0451   BUN 15 06/03/2019 0451   CREATININE 0.61 06/03/2019 0451   CALCIUM 9.3 06/03/2019 0451   GFRNONAA >60 06/03/2019 0451   GFRAA >60 06/03/2019 0451   Assessment/Planning: The patient is a 78 year old female with multiple medical issues including atherosclerotic occlusive disease to the bilateral lower extremity status post endovascular interventions.  Patient now presents with progressively worsening ulceration/gangrene to the left toes.  The patient was originally scheduled to undergo a left lower extremity angiogram with intervention on June 16, 2019 however is now admitted for UTI.  We will proceed with a left lower extremity angiogram with possible intervention with Dr. Delana Meyer tomorrow as the patient is in house.  Met with the patient and her husband who was at the bedside.  Again, discussed the procedure, risks  and benefits.  All questions answered.  The patient wishes to proceed.  Discussed with Dr. Eber Hong Stegmayer PA-C 06/03/2019 1:57 PM

## 2019-06-03 NOTE — Plan of Care (Signed)
  Problem: Education: Goal: Knowledge of General Education information will improve Description: Including pain rating scale, medication(s)/side effects and non-pharmacologic comfort measures 06/03/2019 1448 by Bjorn Pippin, RN Outcome: Progressing  Pt is confused and does not understands her POC>  Her husband is at the bedside and understands POC and has no further questions   Problem: Clinical Measurements: Goal: Will remain free from infection Outcome: Progressing S/Sx of infection monitor and assessed q-shift/ PRN, along with VS.  Pt has been afebrile thus far.  She remains on IV abx per MD's orders.  Will continue to monitor and assess.    Problem: Activity: Goal: Risk for activity intolerance will decrease Outcome: Progressing  Pt is bed bound and needs the assistance of RN staff to reposition her.     Problem: Pain Managment: Goal: General experience of comfort will improve Outcome: Progressing Pt has denied pain thus far.     Problem: Safety: Goal: Ability to remain free from injury will improve Outcome: Progressing Instructed pt to utilize RN call light for assistance.  Hourly rounds performed.  Bed alarm implemented to keep pt safe from falls .  Settings set to third most sensitive setting. Bed in lowest position, locked with two upper side rails engaged.  Belongings and call light within reach.    Problem: Skin Integrity: Goal: Risk for impaired skin integrity will decrease Outcome: Not Progressing Skin integrity monitored and assessed q-shift/ PRN.  Pt is on q2 hourly turns to prevent further skin impairment.  Tubes and drains assessed for device related pressure sores.  Pt has open sacral wound and foot wounds.  MD ordered for surgical consult for an angiogram.  Will continue to monitor and assess.

## 2019-06-03 NOTE — Consult Note (Signed)
NAME: [name]  DOB: March 11, 1941  MRN: [id number]  Date/Time: 06/03/2019 6:44 PM  REQUESTING PROVIDER: Dr. Nathaniel Man Subjective:  REASON FOR CONSULT: ESBL UTI ?Patient is a limited historian.  Chart reviewed. [name] is a 78 y.o. female with a history of diabetes mellitus, CHF, aortic stenosis, peripheral vascular disease, chronic changes in the foot and toes secondary to diabetes, history of treated MSSA infection of the right first metatarsal head ulcer in the past, recent angiogram on 04/27/2019 and angioplasty on the right anterior tibial artery and right SFA done by Dr. Franchot Gallo.  On 03/10/2019 patient had transluminal angioplasty of the left anterior tibial artery,And on 02/09/2019 underwent angioplasty of the left anterior tibial artery, left popliteal artery with plan to do another angiogram on 06/16/2019.  Presents to the ED on 06/02/2019 per EMS from [facility] for recurrent UTI.  Patient is bedbound at home.  Patient was on Macrobid in September and cefepime in October finishing on 05/24/2019.  Her home health called EMS to transport her for IV antibiotics due to recent urine specimen showing UTI.  Patient is totally asymptomatic.  She does not have any symptoms related to urinary tract infection like dysuria or incontinence or pain abdomen.  She has been through multiple courses of antibiotics as outpatient. In the ED her vitals were152/55, temperature of 98, heart rate of 82.  UA showed WBC of more than 50.  Urine culture was sent and he was started on meropenem.  I am asked to see the patient for the same. Patient states she is bedbound and unable to walk.  Says she has had chronic foot wounds and has poor circulation to the feet. She does not have any symptoms of urinary tract infection.  She does not know why she was sent to the hospital.  She states she does not drink much fluid.  She has no fever. Past Medical History:  Diagnosis Date  . Acute heart failure ([place])   . Aortic stenosis    . CHF (congestive heart failure) ([place])   . Complication of anesthesia    Hard to wake patient up after having anesthesia  . Diabetes mellitus without complication ([place])   . Diabetic neuropathy ([facility])    Takes Lyrica  . Edema of both legs    Takes Lasix  . GERD (gastroesophageal reflux disease)   . High cholesterol   . HTN (hypertension)   . Hypertension   . PAD (peripheral artery disease) ([place])   . Shortness of breath dyspnea   . Spasm of back muscles   . Stroke ([place])   . Wound, open    Right great toe    Past Surgical History:  Procedure Laterality Date  . BYPASS GRAFT POPLITEAL TO TIBIAL Right 02/28/2016   Procedure: BYPASS GRAFT RIGHT BELOW KNEE POPLITEAL TO PERONEAL USING REVERSED RIGHT GREATER SAPHENOUS VEIN;  Surgeon: Conrad [place], MD;  Location: [place];  Service: Vascular;  Laterality: Right;  . CYST EXCISION     Abdomen  . EYE SURGERY Bilateral    Cataract removal  . INTRAMEDULLARY (IM) NAIL INTERTROCHANTERIC Left 02/04/2014   Procedure: INTRAMEDULLARY (IM) NAIL INTERTROCHANTRIC FEMORAL;  Surgeon: Mauri Pole, MD;  Location: [place];  Service: Orthopedics;  Laterality: Left;  . IR GENERIC HISTORICAL  03/01/2016   IR ANGIO INTRA EXTRACRAN SEL COM CAROTID INNOMINATE UNI R MOD SED 03/01/2016 [name], MD [facility]  . IR GENERIC HISTORICAL  03/01/2016   IR ENDOVASC INTRACRANIAL INF OTHER THAN  THROMBO ART INC DIAG ANGIO 03/01/2016 [name], MD [facility]  . IR GENERIC HISTORICAL  03/01/2016   IR INTRAVSC STENT CERV CAROTID W/O EMB-PROT MOD SED INC ANGIO 03/01/2016 [name], MD [facility]  . IR GENERIC HISTORICAL  06/03/2016   IR RADIOLOGIST EVAL & MGMT 06/03/2016 [facility]  . LOWER EXTREMITY ANGIOGRAPHY Left 02/09/2019   Procedure: LOWER EXTREMITY ANGIOGRAPHY;  Surgeon: Katha Cabal, MD;  Location: [place] [facility];  Service: Cardiovascular;  Laterality: Left;  . LOWER EXTREMITY ANGIOGRAPHY Left 03/10/2019   Procedure: LOWER EXTREMITY  ANGIOGRAPHY;  Surgeon: Katha Cabal, MD;  Location: [place] [facility];  Service: Cardiovascular;  Laterality: Left;  . LOWER EXTREMITY ANGIOGRAPHY Left 04/27/2019   Procedure: LOWER EXTREMITY ANGIOGRAPHY;  Surgeon: Katha Cabal, MD;  Location: [place] [facility];  Service: Cardiovascular;  Laterality: Left;  . ORIF TOE FRACTURE Right 02/08/2014   Procedure: OPEN REDUCTION INTERNAL FIXATION Right METATARSAL  FRACTURE ;  Surgeon: Wylene Simmer, MD;  Location: [place];  Service: Orthopedics;  Laterality: Right;  . PERIPHERAL VASCULAR CATHETERIZATION N/A 12/28/2015   Procedure: Abdominal Aortogram w/Lower Extremity;  Surgeon: Conrad [place], MD;  Location: [place] [facility];  Service: Cardiovascular;  Laterality: N/A;  . RADIOLOGY WITH ANESTHESIA N/A 03/01/2016   Procedure: RADIOLOGY WITH ANESTHESIA;  Surgeon: [name], MD;  Location: [place];  Service: Radiology;  Laterality: N/A;  . VEIN HARVEST Right 02/28/2016   Procedure: RIGHT GREATER SAPHENOUS VEIN HARVEST;  Surgeon: Conrad [place], MD;  Location: [facility];  Service: Vascular;  Laterality: Right;    Social History   Socioeconomic History  . Marital status: Married    Spouse name: Not on file  . Number of children: Not on file  . Years of education: Not on file  . Highest education level: Not on file  Occupational History  . Not on file  Social Needs  . Financial resource strain: Not on file  . Food insecurity    Worry: Not on file    Inability: Not on file  . Transportation needs    Medical: Not on file    Non-medical: Not on file  Tobacco Use  . Smoking status: Never Smoker  . Smokeless tobacco: Never Used  . Tobacco comment: Never smoked  Substance and Sexual Activity  . Alcohol use: No    Alcohol/week: 0.0 standard drinks  . Drug use: No  . Sexual activity: Never  Lifestyle  . Physical activity    Days per week: Not on file    Minutes per session: Not on file  . Stress: Not on file  Relationships  . Social  Herbalist on phone: Not on file    Gets together: Not on file    Attends religious service: Not on file    Active member of club or organization: Not on file    Attends meetings of clubs or organizations: Not on file    Relationship status: Not on file  . Intimate partner violence    Fear of current or ex partner: Not on file    Emotionally abused: Not on file    Physically abused: Not on file    Forced sexual activity: Not on file  Other Topics Concern  . Not on file  Social History Narrative  . Not on file    Family History  Problem Relation Age of Onset  . Diabetes Other   . Liver disease Mother   .  CVA Father   . Diabetes Father   . Hypertension Father    Allergies  Allergen Reactions  . Iodine Anaphylaxis  . Penicillins Anaphylaxis    Tolerates ceftriaxone, cefazolin Did it involve swelling of the face/tongue/throat, SOB, or low BP? Unknown Did it involve sudden or severe rash/hives, skin peeling, or any reaction on the inside of your mouth or nose? Unknown Did you need to seek medical attention at a hospital or doctor's office? Unknown When did it last happen?Unknown If all above answers are "NO", may proceed with cephalosporin use.   . Shellfish Allergy Anaphylaxis  . Sulfa Antibiotics Anaphylaxis  . Sulfacetamide Sodium Anaphylaxis  . Sulfasalazine Anaphylaxis  . Eggs Or Egg-Derived Products     Other reaction(s): SHORTNESS OF BREATH  . Morphine And Related Other (See Comments)    Altered mental status    ? Current Facility-Administered Medications  Medication Dose Route Frequency Provider Last Rate Last Dose  . [START ON 06/04/2019] 0.9 %  sodium chloride infusion   Intravenous Continuous Stegmayer, Kimberly A, PA-C      . acetaminophen (TYLENOL) tablet 650 mg  650 mg Oral Q6H PRN [name], MD       Or  . acetaminophen (TYLENOL) suppository 650 mg  650 mg Rectal Q6H PRN [name], MD      . acetaminophen (TYLENOL) tablet 1,000 mg   1,000 mg Oral Q6H PRN Ojie, Jude, MD      . albuterol (PROVENTIL) (2.5 MG/3ML) 0.083% nebulizer solution 2.5 mg  2.5 mg Inhalation Q6H PRN Ojie, Jude, MD      . amLODipine (NORVASC) tablet 10 mg  10 mg Oral Daily [name], MD   10 mg at 06/03/19 0939  . aspirin EC tablet 81 mg  81 mg Oral Daily [name], MD   81 mg at 06/03/19 [id number]  . atorvastatin (LIPITOR) tablet 20 mg  20 mg Oral Corwin Levins, MD   20 mg at 06/02/19 2339  . budesonide (PULMICORT) nebulizer solution 0.25 mg  0.25 mg Nebulization BID Ojie, Jude, MD      . carvedilol (COREG) tablet 6.25 mg  6.25 mg Oral BID WC [name], MD   6.25 mg at 06/03/19 1628  . Chlorhexidine Gluconate Cloth 2 % PADS 6 each  6 each Topical Daily Stark Jock, Jude, MD   6 each at 06/03/19 1539  . cholecalciferol (VITAMIN D) tablet 2,000 Units  2,000 Units Oral Daily Stark Jock, Jude, MD   2,000 Units at 06/03/19 1421  . [START ON 06/04/2019] clopidogrel (PLAVIX) tablet 75 mg  75 mg Oral Q breakfast Ojie, Jude, MD      . docusate sodium (COLACE) capsule 100 mg  100 mg Oral BID Stark Jock, Jude, MD   100 mg at 06/03/19 1421  . enoxaparin (LOVENOX) injection 40 mg  40 mg Subcutaneous Q24H [name], MD   40 mg at 06/02/19 2339  . fluticasone (FLONASE) 50 MCG/ACT nasal spray 1 spray  1 spray Each Nare Daily PRN Ojie, Jude, MD      . gabapentin (NEURONTIN) capsule 300 mg  300 mg Oral QPM Ojie, Jude, MD   300 mg at 06/03/19 1627  . hydrALAZINE (APRESOLINE) tablet 25 mg  25 mg Oral Q8H Ojie, Jude, MD   25 mg at 06/03/19 1421  . insulin aspart (novoLOG) injection 0-5 Units  0-5 Units Subcutaneous QHS [name], MD   2 Units at 06/02/19 2342  . insulin aspart (novoLOG) injection 0-9 Units  0-9 Units  Subcutaneous TID WC [name], MD   3 Units at 06/03/19 1715  . insulin glargine (LANTUS) injection 15 Units  15 Units Subcutaneous QHS Ojie, Jude, MD      . ipratropium-albuterol (DUONEB) 0.5-2.5 (3) MG/3ML nebulizer solution 3 mL  3 mL Nebulization BID Ojie,  Jude, MD      . isosorbide dinitrate (ISORDIL) tablet 30 mg  30 mg Oral Daily Ojie, Jude, MD   30 mg at 06/03/19 1458  . loratadine (CLARITIN) tablet 10 mg  10 mg Oral Daily Ojie, Jude, MD   10 mg at 06/03/19 1421  . magnesium oxide (MAG-OX) tablet 400 mg  400 mg Oral BID Stark Jock, Jude, MD   400 mg at 06/03/19 1421  . modafinil (PROVIGIL) tablet 200 mg  200 mg Oral Daily Ojie, Jude, MD   200 mg at 06/03/19 1421  . mupirocin ointment (BACTROBAN) 2 % 1 application  1 application Nasal BID Ojie, Jude, MD      . nitroGLYCERIN (NITROSTAT) SL tablet 0.4 mg  0.4 mg Sublingual Q5 min PRN Ojie, Jude, MD      . ondansetron (ZOFRAN) tablet 4 mg  4 mg Oral Q6H PRN [name], MD       Or  . ondansetron [facility]) injection 4 mg  4 mg Intravenous Q6H PRN [name], MD      . pantoprazole (PROTONIX) EC tablet 40 mg  40 mg Oral Daily [name], MD   40 mg at 06/03/19 [id number]  . polyethylene glycol (MIRALAX / GLYCOLAX) packet 17 g  17 g Oral Daily PRN Ojie, Jude, MD      . potassium chloride SA (KLOR-CON) CR tablet 20 mEq  20 mEq Oral Daily Ojie, Jude, MD   20 mEq at 06/03/19 1421  . pregabalin (LYRICA) capsule 50 mg  50 mg Oral BID Stark Jock, Jude, MD   50 mg at 06/03/19 1421  . torsemide (DEMADEX) tablet 20 mg  20 mg Oral BID Stark Jock, Jude, MD   20 mg at 06/03/19 1628  . vitamin E capsule 400 Units  400 Units Oral Daily Ojie, Jude, MD   400 Units at 06/03/19 1458     Abtx:  Anti-infectives (From admission, onward)   Start     Dose/Rate Route Frequency Ordered Stop   06/02/19 2200  meropenem (MERREM) 1 g in sodium chloride 0.9 % 100 mL IVPB  Status:  Discontinued     1 g 200 mL/hr over 30 Minutes Intravenous Every 8 hours 06/02/19 2036 06/03/19 1550      REVIEW OF SYSTEMS:  Const: negative fever, negative chills, negative weight loss Eyes: negative diplopia or visual changes, negative eye pain ENT: negative coryza, negative sore throat Resp: negative cough, hemoptysis, dyspnea Cards: negative for chest  pain, palpitations, lower extremity edema GU: negative for frequency, dysuria and hematuria GI: Negative for abdominal pain, diarrhea, bleeding, constipation Skin: negative for rash and pruritus Heme: negative for easy bruising and gum/nose bleeding MS: Has generalized weakness Neurolo: Says she had stroke and the left right side was affected before Psych: negative for feelings of anxiety, depression  Endocrine: Has diabetes Allergy/Immunology- n multiple medication allergy including penicillin, sulfa antibiotics.  ? Pertinent Positives include : Objective:  VITALS:  BP (!) 131/54 (BP Location: Right Arm)   Pulse 86   Temp 97.8 F (36.6 C)   Resp 18   Ht 5\' 4"  (1.626 m)   Wt 73.9 kg   SpO2 96%   BMI 27.98 kg/m  PHYSICAL  EXAM:  General: Alert, cooperative, no distress, appears stated age.  Oriented in place person, knows the president's name, responds appropriately Head: Normocephalic, without obvious abnormality, atraumatic. Eyes: Conjunctivae clear, anicteric sclerae. Pupils are equal ENT Nares normal. No drainage or sinus tenderness. Lips, mucosa, and tongue normal. No Thrush Neck: Supple, symmetrical, no adenopathy, thyroid: non tender no carotid bruit and no JVD. Back: Did not examine Lungs: Clear to auscultation bilaterally. No Wheezing or Rhonchi. No rales. Heart: 3 x 6 systolic murmur Abdomen: Soft, non-tender,not distended. Bowel sounds normal. No masses Extremities: Chronic discoloration of the toes and wounds on the toes none look grossly infected      Skin: No rashes or lesions. Or bruising Lymph: Cervical, supraclavicular normal. Neurologic: Did not examine in detail.  But she is able to move her extremities independently Pertinent Labs Lab Results CBC    Component Value Date/Time   WBC 11.0 (H) 06/03/2019 0451   RBC 4.23 06/03/2019 0451   HGB 11.0 (L) 06/03/2019 0451   HCT 34.1 (L) 06/03/2019 0451   PLT 286 06/03/2019 0451   MCV 80.6 06/03/2019 0451    MCH 26.0 06/03/2019 0451   MCHC 32.3 06/03/2019 0451   RDW 14.9 06/03/2019 0451   LYMPHSABS 1.4 12/26/2018 0449   MONOABS 0.8 12/26/2018 0449   EOSABS 0.1 12/26/2018 0449   BASOSABS 0.0 12/26/2018 0449    CMP Latest Ref Rng & Units 06/03/2019 06/02/2019 04/28/2019  Glucose 70 - 99 mg/dL 171(H) 178(H) 241(H)  BUN 8 - 23 mg/dL 15 12 14   Creatinine 0.44 - 1.00 mg/dL 0.61 0.64 0.67  Sodium 135 - 145 mmol/L 138 139 139  Potassium 3.5 - 5.1 mmol/L 3.6 4.1 3.6  Chloride 98 - 111 mmol/L 104 102 106  CO2 22 - 32 mmol/L 25 23 26   Calcium 8.9 - 10.3 mg/dL 9.3 9.6 9.1  Total Protein 6.5 - 8.1 g/dL - 6.7 -  Total Bilirubin 0.3 - 1.2 mg/dL - 0.5 -  Alkaline Phos 38 - 126 U/L - 70 -  AST 15 - 41 U/L - 18 -  ALT 0 - 44 U/L - 18 -      Microbiology: Recent Results (from the past 240 hour(s))  SARS CORONAVIRUS 2 (TAT 6-24 HRS) Nasopharyngeal Nasopharyngeal Swab     Status: None   Collection Time: 06/02/19  9:35 PM   Specimen: Nasopharyngeal Swab  Result Value Ref Range Status   SARS Coronavirus 2 NEGATIVE NEGATIVE Final    Comment: (NOTE) SARS-CoV-2 target nucleic acids are NOT DETECTED. The SARS-CoV-2 RNA is generally detectable in upper and lower respiratory specimens during the acute phase of infection. Negative results do not preclude SARS-CoV-2 infection, do not rule out co-infections with other pathogens, and should not be used as the sole basis for treatment or other patient management decisions. Negative results must be combined with clinical observations, patient history, and epidemiological information. The expected result is Negative. Fact Sheet for Patients: SugarRoll.be Fact Sheet for Healthcare Providers: https://www.woods-mathews.com/ This test is not yet approved or cleared by the [place] FDA and  has been authorized for detection and/or diagnosis of SARS-CoV-2 by FDA under an Emergency Use Authorization (EUA). This EUA  will remain  in effect (meaning this test can be used) for the duration of the COVID-19 declaration under Section 56 4(b)(1) of the Act, 21 U.S.C. section 360bbb-3(b)(1), unless the authorization is terminated or revoked sooner. Performed at [facility], [place] [address] [place], [place] [address]   MRSA  PCR Screening     Status: Abnormal   Collection Time: 06/03/19  3:17 PM   Specimen: Nasopharyngeal  Result Value Ref Range Status   MRSA by PCR POSITIVE (A) NEGATIVE Final    Comment:        The GeneXpert MRSA Assay (FDA approved for NASAL specimens only), is one component of a comprehensive MRSA colonization surveillance program. It is not intended to diagnose MRSA infection nor to guide or monitor treatment for MRSA infections. RESULT CALLED TO, READ BACK BY AND VERIFIED WITH: JUAN RODRIGUEZ 06/03/19 @ [id number]  [facility] Performed at [facility], [address] [place], [place] [address]     IMAGING RESULTS: I have personally reviewed the films ? Impression/Recommendation ? ?UTI.  Patient sent from home for abnormal urinary rate culture.  But patient is totally asymptomatic with no symptom suggestive of urinary tract infection.  Hence will not treat her.  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 We will discuss with her husband.  Peripheral artery disease.  Chronic ischemic wounds of the toes.  None seem to be overtly infected.  There is a plan for angio by Dr. Franchot Gallo.  Recent bilateral angios done in September and July.  Diabetes mellitus.  Is on insulin in the hospital.  Hypertension on amlodipine carvedilol hydralazine ? ___Peripheral vascular disease on clopidogrel.  Patient on modafinil ________________________________________________ Discussed with patient and requesting provider and the pharmacist  Note:  This document was prepared using Dragon voice recognition software  and may include unintentional dictation errors.

## 2019-06-03 NOTE — Consult Note (Addendum)
[place] [facility] Nurse wound consult note Reason for Consult: Consult requested for bilat toes.  [facility] team is performing consults remotely today; reviewed progress notes.  Bedside nurse indicates bilat toes with necrotic wounds and strong odor with mod amt drainage.  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  This complex medical condition is beyond the scope of practice for [place] nurses.  Secure chat sent to the primary team to communicate and they plan to request a Vascular team consult.  Please refer to their recommendations for further plan of care.  Please re-consult if further assistance is needed.  Thank-you,  Julien Girt MSN, [place], [place], [place], [place]

## 2019-06-04 ENCOUNTER — Inpatient Hospital Stay: Payer: Medicare HMO

## 2019-06-04 DIAGNOSIS — I998 Other disorder of circulatory system: Secondary | ICD-10-CM

## 2019-06-04 DIAGNOSIS — E1151 Type 2 diabetes mellitus with diabetic peripheral angiopathy without gangrene: Secondary | ICD-10-CM | POA: Diagnosis not present

## 2019-06-04 DIAGNOSIS — I10 Essential (primary) hypertension: Secondary | ICD-10-CM | POA: Diagnosis not present

## 2019-06-04 DIAGNOSIS — R8271 Bacteriuria: Secondary | ICD-10-CM | POA: Diagnosis not present

## 2019-06-04 DIAGNOSIS — L8990 Pressure ulcer of unspecified site, unspecified stage: Secondary | ICD-10-CM | POA: Diagnosis present

## 2019-06-04 LAB — CBC
HCT: 31.9 % — ABNORMAL LOW (ref 36.0–46.0)
Hemoglobin: 10.1 g/dL — ABNORMAL LOW (ref 12.0–15.0)
MCH: 25.9 pg — ABNORMAL LOW (ref 26.0–34.0)
MCHC: 31.7 g/dL (ref 30.0–36.0)
MCV: 81.8 fL (ref 80.0–100.0)
Platelets: 277 10*3/uL (ref 150–400)
RBC: 3.9 MIL/uL (ref 3.87–5.11)
RDW: 15 % (ref 11.5–15.5)
WBC: 10.1 10*3/uL (ref 4.0–10.5)
nRBC: 0 % (ref 0.0–0.2)

## 2019-06-04 LAB — BASIC METABOLIC PANEL
Anion gap: 7 (ref 5–15)
BUN: 11 mg/dL (ref 8–23)
CO2: 25 mmol/L (ref 22–32)
Calcium: 9.3 mg/dL (ref 8.9–10.3)
Chloride: 104 mmol/L (ref 98–111)
Creatinine, Ser: 0.49 mg/dL (ref 0.44–1.00)
GFR calc Af Amer: >60 mL/min (ref >60)
GFR calc non Af Amer: >60 mL/min (ref >60)
Glucose, Bld: 118 mg/dL — ABNORMAL HIGH (ref 70–99)
Potassium: 3.8 mmol/L (ref 3.5–5.1)
Sodium: 136 mmol/L (ref 135–145)

## 2019-06-04 LAB — GLUCOSE, CAPILLARY
Glucose-Capillary: 94 mg/dL (ref 70–99)
Glucose-Capillary: 93 mg/dL (ref 70–99)
Glucose-Capillary: 204 mg/dL — ABNORMAL HIGH (ref 70–99)
Glucose-Capillary: 392 mg/dL — ABNORMAL HIGH (ref 70–99)
Glucose-Capillary: 302 mg/dL — ABNORMAL HIGH (ref 70–99)

## 2019-06-04 LAB — MAGNESIUM: Magnesium: 2.1 mg/dL (ref 1.7–2.4)

## 2019-06-04 MED ORDER — FAMOTIDINE IN NACL 20-0.9 MG/50ML-% IV SOLN
20.0000 mg | Freq: Once | INTRAVENOUS | Status: AC
  Administered 2019-06-04: 20 mg via INTRAVENOUS
  Filled 2019-06-04: qty 50

## 2019-06-04 MED ORDER — DIPHENHYDRAMINE HCL 50 MG/ML IJ SOLN
INTRAMUSCULAR | Status: AC
  Administered 2019-06-04: 25 mg via INTRAVENOUS
  Filled 2019-06-04: qty 1

## 2019-06-04 MED ORDER — MIDAZOLAM HCL 5 MG/5ML IJ SOLN
INTRAMUSCULAR | Status: AC
  Filled 2019-06-04: qty 5

## 2019-06-04 MED ORDER — FENTANYL CITRATE (PF) 100 MCG/2ML IJ SOLN
INTRAMUSCULAR | Status: AC
  Filled 2019-06-04: qty 2

## 2019-06-04 MED ORDER — HYDROMORPHONE HCL 1 MG/ML IJ SOLN: 1.0000 mg | Freq: Once | INTRAMUSCULAR | Status: DC | PRN

## 2019-06-04 MED ORDER — HEPARIN SODIUM (PORCINE) 1000 UNIT/ML IJ SOLN
INTRAMUSCULAR | Status: AC
  Filled 2019-06-04: qty 1

## 2019-06-04 MED ORDER — ONDANSETRON HCL 4 MG/2ML IJ SOLN: 4.0000 mg | Freq: Four times a day (QID) | INTRAMUSCULAR | Status: DC | PRN

## 2019-06-04 MED ORDER — SODIUM CHLORIDE 0.9% FLUSH
10.0000 mL | Freq: Two times a day (BID) | INTRAVENOUS | Status: DC
  Administered 2019-06-05: 11:00:00 20 mL
  Administered 2019-06-06: 10 mL
  Administered 2019-06-06: 13:00:00 3 mL
  Administered 2019-06-07 – 2019-06-11 (×6): 10 mL

## 2019-06-04 MED ORDER — MIDAZOLAM HCL 2 MG/ML PO SYRP: 8.0000 mg | ORAL_SOLUTION | Freq: Once | ORAL | Status: DC | PRN

## 2019-06-04 MED ORDER — FAMOTIDINE 20 MG PO TABS: 40.0000 mg | ORAL_TABLET | Freq: Once | ORAL | Status: DC | PRN

## 2019-06-04 MED ORDER — CLINDAMYCIN PHOSPHATE 300 MG/50ML IV SOLN
INTRAVENOUS | Status: AC
  Filled 2019-06-04: qty 50

## 2019-06-04 MED ORDER — SODIUM CHLORIDE 0.9% FLUSH: 10.0000 mL | INTRAVENOUS | Status: DC | PRN

## 2019-06-04 MED ORDER — DIPHENHYDRAMINE HCL 50 MG/ML IJ SOLN
50.0000 mg | Freq: Once | INTRAMUSCULAR | Status: AC | PRN
  Administered 2019-06-04: 08:00:00 25 mg via INTRAVENOUS

## 2019-06-04 MED ORDER — METHYLPREDNISOLONE SODIUM SUCC 125 MG IJ SOLR
125.0000 mg | Freq: Once | INTRAMUSCULAR | Status: AC | PRN
  Administered 2019-06-04: 125 mg via INTRAVENOUS

## 2019-06-04 MED ORDER — METHYLPREDNISOLONE SODIUM SUCC 125 MG IJ SOLR
INTRAMUSCULAR | Status: AC
  Filled 2019-06-04: qty 2

## 2019-06-04 MED ORDER — SODIUM CHLORIDE 0.9 % IV SOLN: INTRAVENOUS | Status: DC

## 2019-06-04 NOTE — Progress Notes (Addendum)
[name], [name] was paged and informed that patient is only responsive to turning or sternal rub and will not answer orientation questions. Only 25 mg of benadryl will be given for dye allergy prep rather than 50 mg.  Requested that Dr. Delana Meyer come to the bedside to assess patient as her nuero assessment is concerning. Patient will not speak fully formed sentences, pupils are very sluggish and barely responsive remaining at around 3/4 mm. Patient appears to be fatigued and sleepy. Per Dr. Delana Meyer and his assessment patient is to return to room and hospitalist informed of change is status. Diedra the nurse was informed of this and stated that she would notify hospitalist once patient is returned to the room.

## 2019-06-04 NOTE — Progress Notes (Signed)
[facility] [place] NAME: [name]    MR#:  [id number]  DATE OF BIRTH:  October 08, 1940  SUBJECTIVE:  CHIEF COMPLAINT:   Chief Complaint  Patient presents with  . Recurrent UTI   Patient was scheduled for angiogram this morning but this had to be canceled this morning due to patient being very lethargic and patient sent back to the room.  No fevers. Patient now awake and alert.  Husband at bedside stating that patient is usually is very lethargic in the mornings.  REVIEW OF SYSTEMS:  ROS Constitutional: Negative for chills, fever, malaise/fatigue and weight loss.  HENT: Negative for ear pain, hearing loss and tinnitus.   Eyes: Negative for blurred vision, double vision, pain and redness.  Respiratory: Negative for cough, hemoptysis and shortness of breath.   Cardiovascular: Negative for chest pain, palpitations, orthopnea and leg swelling.  Gastrointestinal: Negative for abdominal pain, constipation, diarrhea, nausea and vomiting.  Genitourinary: Negative for frequency and hematuria.  Musculoskeletal: Negative for back pain, joint pain and neck pain.  Skin:       No acne, rash, or lesions  Neurological: Negative for dizziness, tremors, focal weakness and weakness.  Endo/Heme/Allergies: Negative for polydipsia. Does not bruise/bleed easily.  Psychiatric/Behavioral: Negative for depression. The patient is not nervous/anxious and does not have insomnia.   DRUG ALLERGIES:   Allergies  Allergen Reactions  . Iodine Anaphylaxis  . Penicillins Anaphylaxis    Tolerates ceftriaxone, cefazolin Did it involve swelling of the face/tongue/throat, SOB, or low BP? Unknown Did it involve sudden or severe rash/hives, skin peeling, or any reaction on the inside of your mouth or nose? Unknown Did you need to seek medical attention at a hospital or doctor's office? Unknown When did it last happen?Unknown If all above answers are "NO", may proceed  with cephalosporin use.   . Shellfish Allergy Anaphylaxis  . Sulfa Antibiotics Anaphylaxis  . Sulfacetamide Sodium Anaphylaxis  . Sulfasalazine Anaphylaxis  . Eggs Or Egg-Derived Products     Other reaction(s): SHORTNESS OF BREATH  . Morphine And Related Other (See Comments)    Altered mental status   VITALS:  Blood pressure (!) 130/47, pulse 67, temperature (!) 97.3 F (36.3 C), temperature source Oral, resp. rate 16, height 5\' 4"  (1.626 m), weight 73.9 kg, SpO2 99 %. PHYSICAL EXAMINATION:  Physical Exam  Constitutional: She is oriented to person, place, and time and well-developed, well-nourished, and in no distress.  HENT:  Head: Normocephalic and atraumatic.  Eyes: Pupils are equal, round, and reactive to light. Conjunctivae are normal. Right eye exhibits no discharge.  Neck: Normal range of motion. Neck supple. No thyromegaly present.  Cardiovascular: Normal rate, regular rhythm and normal heart sounds.  Pulmonary/Chest: Effort normal and breath sounds normal. No respiratory distress.  Abdominal: Soft. Bowel sounds are normal. She exhibits no distension.  Musculoskeletal: Normal range of motion.     Comments: Gangrenous toes on both lower extremity as evidenced on the pictures below.  Neurological: She is alert and oriented to person, place, and time.  Generalized weakness  Skin: Skin is warm. She is not diaphoretic.  Gangrenous toes on both lower extremity as evidenced on the pictures below.  Psychiatric: Affect and judgment normal.         LABORATORY PANEL:  Female CBC Recent Labs  Lab 06/04/19 0533  WBC 10.1  HGB 10.1*  HCT 31.9*  PLT 277   ------------------------------------------------------------------------------------------------------------------ Chemistries  Recent Labs  Lab 06/02/19 2149  06/04/19 0533  NA 139   < > 136  K 4.1   < > 3.8  CL 102   < > 104  CO2 23   < > 25  GLUCOSE 178*   < > 118*  BUN 12   < > 11  CREATININE 0.64   < > 0.49   CALCIUM 9.6   < > 9.3  MG  --   --  2.1  AST 18  --   --   ALT 18  --   --   ALKPHOS 70  --   --   BILITOT 0.5  --   --    < > = values in this interval not displayed.   RADIOLOGY:  Ct Head Wo Contrast  Result Date: 06/04/2019 CLINICAL DATA:  Encephalopathy, history CHF, diabetes mellitus, hypertension, stroke EXAM: CT HEAD WITHOUT CONTRAST TECHNIQUE: Contiguous axial images were obtained from the base of the skull through the vertex without intravenous contrast. COMPARISON:  12/23/2018 FINDINGS: Brain: Generalized atrophy. Normal ventricular morphology for degree of atrophy. No midline shift or mass effect. 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 Calcified mass at floor of RIGHT posterior fossa 11 x 11 x 7 mm likely calcified meningioma unchanged. No intracranial hemorrhage, additional mass lesion or evidence of acute infarction. No extra-axial fluid collections. Vascular: Atherosclerotic calcification of internal carotid and vertebral arteries at skull base Skull: Demineralized but intact Sinuses/Orbits: Clear Other: N/A IMPRESSION: Atrophy with small vessel chronic ischemic changes of deep cerebral white matter. 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 No acute intracranial abnormalities. Electronically Signed   By: Lavonia Dana M.D.   On: 06/04/2019 10:40   ASSESSMENT AND PLAN:   1.   Asymptomatic bacteriuria  Patient with history of recurrent UTI -patient has a history of ESBL E. coli UTI which is only sensitive to gentamicin, Macrobid, imipenem.  Patient was sent to the hospital from primary care physician for recurrent UTI.  Patient was initially started on IV meropenem pending results of urine culture and sensitivities Patient is incontinent of urine.  Denies any fevers or dysuria. Patient completely asymptomatic.  Seen by infectious disease specialist Dr. Steva Ready who also agrees that antibiotics is not indicated  at this time since patient is completely asymptomatic.  She is very likely colonized or could have pyuria and bacteriuria secondary to genitourinary syndrome of menopause This morning infectious disease specialist and I discussed with husband and explained to him that unless patient is symptomatic with fevers, there was no indication to continue rechecking urinalysis.  Infectious disease specialist to also contact primary care physician with same information.  2. HTN (hypertension)  Blood pressure control improving.  Continue Norvasc and Coreg   3  DM2 (diabetes mellitus, type 2) (HCC) -sliding scale insulin  4.  Chronic diastolic CHF (congestive heart failure) ; Stable -continue home meds  5.   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 Vascular surgery consulted and patient was initially scheduled for angiogram this morning.  Angiogram was canceled and patient sent back to the room due to concern for altered mental status and patient being very lethargic.  Stat CT scan of the head done negative for any acute findings. Patient's husband later arrived and reported that usually patient is very lethargic in the mornings.  Husband not comfortable with discharging patient today.  Would like angiogram to be done prior to discharge.  I discussed with vascular team and plan is for angiogram on Tuesday if patient still in the hospital.  6.  HLD (hyperlipidemia) -home dose antilipid   DVT prophylaxis; Lovenox  All the records are reviewed and case discussed with Care Management/Social Worker. Management plans discussed with the patient, family and they are in agreement. Updated husband present at bedside on treatment plans.  CODE STATUS: Full Code  TOTAL TIME TAKING CARE OF THIS PATIENT: 36 minutes.   More than 50% of the time was spent in counseling/coordination of care: YES  POSSIBLE D/C IN 2 DAYS, DEPENDING ON CLINICAL  CONDITION.   Cleopha Indelicato M.D on 06/04/2019 at 12:01 PM  Between 7am to 6pm - Pager - [phone]  After 6pm go to [email or web address] - Technical brewer [facility]  Office  [phone]  CC: Primary care physician; [name], MD  Note: This dictation was prepared with Dragon dictation along with smaller phrase technology. Any transcriptional errors that result from this process are unintentional.

## 2019-06-04 NOTE — Progress Notes (Addendum)
Date of Admission:  06/02/2019     ID: [name] is a 78 y.o.  Principal Problem:   Recurrent UTI Active Problems:   HTN (hypertension)   HLD (hyperlipidemia)   DM2 (diabetes mellitus, type 2) (HCC)   Chronic diastolic CHF (congestive heart failure) (HCC)   PVD (peripheral vascular disease) (HCC)   UTI (urinary tract infection)   Pressure injury of skin    Subjective: Patient apparently was very sleepy this morning and hence was not taken for angiogram.  But now she is awake and talking and is asking to drink some fluids.  Husband at bedside.  Medications:  . amLODipine  10 mg Oral Daily  . aspirin EC  81 mg Oral Daily  . atorvastatin  20 mg Oral QHS  . budesonide  0.25 mg Nebulization BID  . carvedilol  6.25 mg Oral BID WC  . Chlorhexidine Gluconate Cloth  6 each Topical Daily  . cholecalciferol  2,000 Units Oral Daily  . clopidogrel  75 mg Oral Q breakfast  . docusate sodium  100 mg Oral BID  . enoxaparin (LOVENOX) injection  40 mg Subcutaneous Q24H  . gabapentin  300 mg Oral QPM  . hydrALAZINE  25 mg Oral Q8H  . insulin aspart  0-5 Units Subcutaneous QHS  . insulin aspart  0-9 Units Subcutaneous TID WC  . insulin glargine  15 Units Subcutaneous QHS  . ipratropium-albuterol  3 mL Nebulization BID  . isosorbide dinitrate  30 mg Oral Daily  . loratadine  10 mg Oral Daily  . magnesium oxide  400 mg Oral BID  . methylPREDNISolone sodium succinate      . modafinil  200 mg Oral Daily  . mupirocin ointment  1 application Nasal BID  . pantoprazole  40 mg Oral Daily  . potassium chloride SA  20 mEq Oral Daily  . pregabalin  50 mg Oral BID  . torsemide  20 mg Oral BID  . vitamin E  400 Units Oral Daily    Objective: Vital signs in last 24 hours: Temp:  [97.3 F (36.3 C)-98.1 F (36.7 C)] 97.3 F (36.3 C) (10/30 0858) Pulse Rate:  [57-79] 67 (10/30 0858) Resp:  [14-16] 16 (10/30 0858) BP: (116-130)/(46-64) 130/47 (10/30 0858) SpO2:  [95 %-99 %] 99 % (10/30  0858)  PHYSICAL EXAM:  General: Alert, cooperative, no distress, slow to respond.  Oriented in self        Lab Results Recent Labs    06/03/19 0451 06/04/19 0533  WBC 11.0* 10.1  HGB 11.0* 10.1*  HCT 34.1* 31.9*  NA 138 136  K 3.6 3.8  CL 104 104  CO2 25 25  BUN 15 11  CREATININE 0.61 0.49   Liver Panel Recent Labs    06/02/19 2149  PROT 6.7  ALBUMIN 3.3*  AST 18  ALT 18  ALKPHOS 70  BILITOT 0.5   Sedimentation Rate No results for input(s): ESRSEDRATE in the last 72 hours. C-Reactive Protein No results for input(s): CRP in the last 72 hours.  Microbiology:  Studies/Results: Ct Head Wo Contrast  Result Date: 06/04/2019 CLINICAL DATA:  Encephalopathy, history CHF, diabetes mellitus, hypertension, stroke EXAM: CT HEAD WITHOUT CONTRAST TECHNIQUE: Contiguous axial images were obtained from the base of the skull through the vertex without intravenous contrast. COMPARISON:  12/23/2018 FINDINGS: Brain: Generalized atrophy. Normal ventricular morphology for degree of atrophy. No midline shift or mass effect. 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 Calcified mass at floor of RIGHT  posterior fossa 11 x 11 x 7 mm likely calcified meningioma unchanged. No intracranial hemorrhage, additional mass lesion or evidence of acute infarction. No extra-axial fluid collections. Vascular: Atherosclerotic calcification of internal carotid and vertebral arteries at skull base Skull: Demineralized but intact Sinuses/Orbits: Clear Other: N/A IMPRESSION: Atrophy with small vessel chronic ischemic changes of deep cerebral white matter. 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 No acute intracranial abnormalities. Electronically Signed   By: Lavonia Dana M.D.   On: 06/04/2019 10:40     Assessment/Plan:   Asymptomatic bacteriuria but has been sent from home as UTI as the visiting nurse keeps checking her urine and finding  bacteria in the urine.  Patient is totally asymptomatic.  Post void bladder scan did not show any residue.  Discussed with husband that urine should never be checked routinely every week.  It should only be checked when she has a fever.  Even somnolence or lethargy is not a sign of urinary tract infection.  It could mean that she is dehydrated or constipated.  Or due to medication. I explained to the husband that we will always find bacteria in the urine and pus cells because she is bedbound and her personal hygiene is compromised. Husband understands the situation and has asked me to contact his P[facility].  He will also tell her about not checking urine routinely.  Peripheral artery disease.  Chronic ischemic wounds of the toes.  None seem to be overtly infected.  Dr. Franchot Gallo following her. Next Diabetes mellitus on insulin in the hospital.  Hypertension on amlodipine, carvedilol and hydralazine.  Patient is on modafinil  Discussed with her husband and Dr. Stark Jock. ID will sign off call if needed.

## 2019-06-05 LAB — GLUCOSE, CAPILLARY
Glucose-Capillary: 189 mg/dL — ABNORMAL HIGH (ref 70–99)
Glucose-Capillary: 157 mg/dL — ABNORMAL HIGH (ref 70–99)
Glucose-Capillary: 251 mg/dL — ABNORMAL HIGH (ref 70–99)
Glucose-Capillary: 149 mg/dL — ABNORMAL HIGH (ref 70–99)

## 2019-06-05 LAB — BASIC METABOLIC PANEL
Anion gap: 7 (ref 5–15)
BUN: 11 mg/dL (ref 8–23)
CO2: 27 mmol/L (ref 22–32)
Calcium: 9.5 mg/dL (ref 8.9–10.3)
Chloride: 106 mmol/L (ref 98–111)
Creatinine, Ser: 0.61 mg/dL (ref 0.44–1.00)
GFR calc Af Amer: >60 mL/min (ref >60)
GFR calc non Af Amer: >60 mL/min (ref >60)
Glucose, Bld: 225 mg/dL — ABNORMAL HIGH (ref 70–99)
Potassium: 3.9 mmol/L (ref 3.5–5.1)
Sodium: 140 mmol/L (ref 135–145)

## 2019-06-05 LAB — URINE CULTURE: Culture: >=100000 — AB

## 2019-06-05 LAB — MAGNESIUM: Magnesium: 2.1 mg/dL (ref 1.7–2.4)

## 2019-06-05 NOTE — Progress Notes (Signed)
[facility] [place] NAME: [name]    MR#:  [id number]  DATE OF BIRTH:  [birth year]  SUBJECTIVE:  CHIEF COMPLAINT:   Chief Complaint  Patient presents with  . Recurrent UTI   Patient seen today More awake and alert Response to all verbal commands Yesterday angiogram was canceled secondary to lethargy and confusion  REVIEW OF SYSTEMS:  ROS Constitutional: Negative for chills, fever, malaise/fatigue and weight loss.  HENT: Negative for ear pain, hearing loss and tinnitus.   Eyes: Negative for blurred vision, double vision, pain and redness.  Respiratory: Negative for cough, hemoptysis and shortness of breath.   Cardiovascular: Negative for chest pain, palpitations, orthopnea and leg swelling.  Gastrointestinal: Negative for abdominal pain, constipation, diarrhea, nausea and vomiting.  Genitourinary: Negative for frequency and hematuria.  Musculoskeletal: Negative for back pain, joint pain and neck pain.  Skin:       No acne, rash, or lesions  Neurological: Negative for dizziness, tremors, focal weakness and weakness.  Endo/Heme/Allergies: Negative for polydipsia. Does not bruise/bleed easily.  Psychiatric/Behavioral: Negative for depression. The patient is not nervous/anxious and does not have insomnia.   DRUG ALLERGIES:   Allergies  Allergen Reactions  . Iodine Anaphylaxis  . Penicillins Anaphylaxis    Tolerates ceftriaxone, cefazolin Did it involve swelling of the face/tongue/throat, SOB, or low BP? Unknown Did it involve sudden or severe rash/hives, skin peeling, or any reaction on the inside of your mouth or nose? Unknown Did you need to seek medical attention at a hospital or doctor's office? Unknown When did it last happen?Unknown If all above answers are "NO", may proceed with cephalosporin use.   . Shellfish Allergy Anaphylaxis  . Sulfa Antibiotics Anaphylaxis  . Sulfacetamide Sodium Anaphylaxis  .  Sulfasalazine Anaphylaxis  . Eggs Or Egg-Derived Products     Other reaction(s): SHORTNESS OF BREATH  . Morphine And Related Other (See Comments)    Altered mental status   VITALS:  Blood pressure (!) 127/57, pulse 68, temperature 97.9 F (36.6 C), temperature source Oral, resp. rate 16, height 5\' 4"  (1.626 m), weight 73.9 kg, SpO2 98 %. PHYSICAL EXAMINATION:  Physical Exam  Constitutional: She is oriented to person, place, and time and well-developed, well-nourished, and in no distress.  HENT:  Head: Normocephalic and atraumatic.  Eyes: Pupils are equal, round, and reactive to light. Conjunctivae are normal. Right eye exhibits no discharge.  Neck: Normal range of motion. Neck supple. No thyromegaly present.  Cardiovascular: Normal rate, regular rhythm and normal heart sounds.  Pulmonary/Chest: Effort normal and breath sounds normal. No respiratory distress.  Abdominal: Soft. Bowel sounds are normal. She exhibits no distension.  Musculoskeletal: Normal range of motion.     Comments: Gangrenous toes on both lower extremity as evidenced on the pictures below.  Neurological: She is alert and oriented to person, place, and time.  Generalized weakness  Skin: Skin is warm. She is not diaphoretic.  Gangrenous toes on both lower extremity as evidenced on the pictures below.  Psychiatric: Affect and judgment normal.         LABORATORY PANEL:  Female CBC Recent Labs  Lab 06/04/19 0533  WBC 10.1  HGB 10.1*  HCT 31.9*  PLT 277   ------------------------------------------------------------------------------------------------------------------ Chemistries  Recent Labs  Lab 06/02/19 2149  06/05/19 0541  NA 139   < > 140  K 4.1   < > 3.9  CL 102   < > 106  CO2 23   < >  27  GLUCOSE 178*   < > 225*  BUN 12   < > 11  CREATININE 0.64   < > 0.61  CALCIUM 9.6   < > 9.5  MG  --    < > 2.1  AST 18  --   --   ALT 18  --   --   ALKPHOS 70  --   --   BILITOT 0.5  --   --    < > =  values in this interval not displayed.   RADIOLOGY:  No results found. ASSESSMENT AND PLAN:  78 year-old female patient with history of aortic stenosis, chronic congestive heart failure, diabetic neuropathy, type 2 diabetes mellitus, GERD, hyperlipidemia, hypertension, peripheral arterial disease, CVA, wounds in the toes currently under hospitalist service  1.   Asymptomatic bacteriuria  Patient with history of recurrent UTI -patient has a history of ESBL E. coli UTI which is only sensitive to gentamicin, Macrobid, imipenem.  Patient was sent to the hospital from primary care physician for recurrent UTI.  Patient was initially started on IV meropenem pending results of urine culture and sensitivities Patient is incontinent of urine.  Denies any fevers or dysuria. Patient completely asymptomatic.  Seen by infectious disease specialist Dr. Steva Ready who also agrees that antibiotics is not indicated at this time since patient is completely asymptomatic.  She is very likely colonized or could have pyuria and bacteriuria secondary to genitourinary syndrome of menopause This morning infectious disease specialist and I discussed with husband and explained to him that unless patient is symptomatic with fevers, there was no indication to continue rechecking urinalysis.  Infectious disease specialist to also contact primary care physician with same information.  2. HTN (hypertension)  Blood pressure control improving.  Continue Norvasc and Coreg   3  DM2 (diabetes mellitus, type 2) (HCC) -sliding scale insulin  4.  Chronic diastolic CHF (congestive heart failure) ; Stable -continue home meds Medical management to continue  5.   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 Vascular surgery consulted and patient was initially scheduled for angiogram this morning.  Follow-up with vascular surgery for angiogram and further intervention.   Stat CT scan of the head done negative for any acute findings. Patient's husband later arrived and reported that usually patient is very lethargic in the mornings.  Husband  Would like angiogram to be done prior to discharge.  I discussed with vascular team and plan is for angiogram on Tuesday if patient still in the hospital.  6.  HLD (hyperlipidemia) -home dose antilipid   7.DVT prophylaxis; Lovenox  All the records are reviewed and case discussed with Care Management/Social Worker. Management plans discussed with the patient, family and they are in agreement. Updated husband present at bedside on treatment plans.  CODE STATUS: Full Code  TOTAL TIME TAKING CARE OF THIS PATIENT: 36 minutes.   More than 50% of the time was spent in counseling/coordination of care: YES  POSSIBLE D/C IN 2 DAYS, DEPENDING ON CLINICAL CONDITION.   Saundra Shelling M.D on 06/05/2019 at 11:40 AM  Between 7am to 6pm - Pager - [phone]  After 6pm go to [email or web address] - Technical brewer [facility]  Office  [phone]  CC: Primary care physician; [name], MD  Note: This dictation was prepared with Dragon dictation along with smaller phrase technology. Any transcriptional errors that result from this process are unintentional.

## 2019-06-05 NOTE — Progress Notes (Signed)
*   Surgery Date in Future *   Subjective/Chief Complaint: Patient feels cold.  She denies any pain. Dressings intact.   Objective: Vital signs in last 24 hours: Temp:  [97.5 F (36.4 C)-98 F (36.7 C)] 97.9 F (36.6 C) (10/31 0811) Pulse Rate:  [68-80] 68 (10/31 0811) Resp:  [16-18] 16 (10/31 0811) BP: (118-156)/(49-57) 127/57 (10/31 0811) SpO2:  [97 %-98 %] 98 % (10/31 0811) Last BM Date: 06/03/19  Intake/Output from previous day: 10/30 0701 - 10/31 0700 In: 1163.6 [I.V.:1163.6] Out: 1400 [Urine:1400] Intake/Output this shift: No intake/output data recorded.  General appearance: alert, cooperative, appears stated age and morbidly obese Head: Normocephalic, without obvious abnormality, atraumatic Resp: clear to auscultation bilaterally and normal percussion bilaterally Extremities: feet dressings intact  Lab Results:  Recent Labs    06/03/19 0451 06/04/19 0533  WBC 11.0* 10.1  HGB 11.0* 10.1*  HCT 34.1* 31.9*  PLT 286 277   BMET Recent Labs    06/04/19 0533 06/05/19 0541  NA 136 140  K 3.8 3.9  CL 104 106  CO2 25 27  GLUCOSE 118* 225*  BUN 11 11  CREATININE 0.49 0.61  CALCIUM 9.3 9.5   PT/INR No results for input(s): LABPROT, INR in the last 72 hours. ABG No results for input(s): PHART, HCO3 in the last 72 hours.  Invalid input(s): PCO2, PO2  Studies/Results: Ct Head Wo Contrast  Result Date: 06/04/2019 CLINICAL DATA:  Encephalopathy, history CHF, diabetes mellitus, hypertension, stroke EXAM: CT HEAD WITHOUT CONTRAST TECHNIQUE: Contiguous axial images were obtained from the base of the skull through the vertex without intravenous contrast. COMPARISON:  12/23/2018 FINDINGS: Brain: Generalized atrophy. Normal ventricular morphology for degree of atrophy. No midline shift or mass effect. 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 Calcified mass at floor of RIGHT posterior fossa 11 x 11 x 7 mm likely calcified meningioma unchanged. No intracranial  hemorrhage, additional mass lesion or evidence of acute infarction. No extra-axial fluid collections. Vascular: Atherosclerotic calcification of internal carotid and vertebral arteries at skull base Skull: Demineralized but intact Sinuses/Orbits: Clear Other: N/A IMPRESSION: Atrophy with small vessel chronic ischemic changes of deep cerebral white matter. 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 No acute intracranial abnormalities. Electronically Signed   By: Lavonia Dana M.D.   On: 06/04/2019 10:40    Anti-infectives: Anti-infectives (From admission, onward)   Start     Dose/Rate Route Frequency Ordered Stop   06/04/19 0000  clindamycin (CLEOCIN) IVPB 300 mg  Status:  Discontinued    Note to Pharmacy: To be given in specials   300 mg 100 mL/hr over 30 Minutes Intravenous  Once 06/03/19 2334 06/04/19 0852   06/02/19 2200  meropenem (MERREM) 1 g in sodium chloride 0.9 % 100 mL IVPB  Status:  Discontinued     1 g 200 mL/hr over 30 Minutes Intravenous Every 8 hours 06/02/19 2036 06/03/19 1550      Assessment/Plan: s/p Procedure(s): Lower Extremity Angiography (Left) Continue with conservative measures  LOS: 3 days    Elmore Guise 06/05/2019

## 2019-06-06 DIAGNOSIS — I96 Gangrene, not elsewhere classified: Secondary | ICD-10-CM

## 2019-06-06 DIAGNOSIS — I5032 Chronic diastolic (congestive) heart failure: Secondary | ICD-10-CM

## 2019-06-06 LAB — BASIC METABOLIC PANEL
Anion gap: 4 — ABNORMAL LOW (ref 5–15)
BUN: 8 mg/dL (ref 8–23)
CO2: 30 mmol/L (ref 22–32)
Calcium: 9 mg/dL (ref 8.9–10.3)
Chloride: 107 mmol/L (ref 98–111)
Creatinine, Ser: 0.49 mg/dL (ref 0.44–1.00)
GFR calc Af Amer: >60 mL/min (ref >60)
GFR calc non Af Amer: >60 mL/min (ref >60)
Glucose, Bld: 86 mg/dL (ref 70–99)
Potassium: 3.7 mmol/L (ref 3.5–5.1)
Sodium: 141 mmol/L (ref 135–145)

## 2019-06-06 LAB — CBC
HCT: 32.8 % — ABNORMAL LOW (ref 36.0–46.0)
Hemoglobin: 10.2 g/dL — ABNORMAL LOW (ref 12.0–15.0)
MCH: 25.6 pg — ABNORMAL LOW (ref 26.0–34.0)
MCHC: 31.1 g/dL (ref 30.0–36.0)
MCV: 82.4 fL (ref 80.0–100.0)
Platelets: 297 10*3/uL (ref 150–400)
RBC: 3.98 MIL/uL (ref 3.87–5.11)
RDW: 15.3 % (ref 11.5–15.5)
WBC: 9.7 10*3/uL (ref 4.0–10.5)
nRBC: 0 % (ref 0.0–0.2)

## 2019-06-06 LAB — GLUCOSE, CAPILLARY
Glucose-Capillary: 91 mg/dL (ref 70–99)
Glucose-Capillary: 130 mg/dL — ABNORMAL HIGH (ref 70–99)
Glucose-Capillary: 235 mg/dL — ABNORMAL HIGH (ref 70–99)
Glucose-Capillary: 257 mg/dL — ABNORMAL HIGH (ref 70–99)

## 2019-06-06 NOTE — Progress Notes (Addendum)
PROGRESS NOTE  [name] [id number] DOB: 20-Feb-1941 DOA: 06/02/2019 PCP: [name], MD  Brief History   78 year old woman presented with recurrent UTI, PMH resistant organisms, has required PICC line for antibiotics in the past.  Admitted for UTI.  Subsequently seen by infectious disease and felt to have asymptomatic bacteriuria.  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  Seen by vascular surgery.  A & P  Gangrenous toes bilateral lower extremities, atherosclerosis bilateral lower extremities.  Plans were for left angiogram but were canceled temporarily given somnolence which resolved.  Follows with vascular surgery as an outpatient, status post angiogram 9/22 which addressed right leg. --Continue conservative measures today per surgery.  Awaiting angiogram.  Asymptomatic bacteriuria, seen by infectious disease with recommendation not to use antibiotics unless symptomatic with fever. --No further evaluation suggested at this time  Diabetes mellitus type 2 with diabetic neuropathy.  Hemoglobin A1c 8.3. --Stable.  Continue Lantus and sliding scale insulin.  Continue Neurontin.  Essential hypertension --Stable.  Continue Norvasc and carvedilol  Chronic diastolic CHF --Appears compensated.  Continue torsemide, carvedilol.  PMH mild to moderate aortic stenosis  DVT prophylaxis: enoxaparin Code Status: Full Family Communication: none Disposition Plan: Lives at home with husband prior to admission.  Per chart bedbound and has been uses [employer] lift to transfer to wheelchair.  Has old equipment at home.   Murray Hodgkins, MD  [facility] Direct contact: see [email or web address] (further directions at bottom of note if needed) 7PM-7AM contact night coverage as at bottom of note 06/06/2019, 10:07 AM  LOS: 4 days   Significant Hospital Events   . 10/28 admitted for asymptomatic bacteriuria . 10/29 seen by vascular surgery . 10/29 seen by  infectious disease   Consults:  [name] Vascular surgery . Infectious disease   Procedures:  .   Significant Diagnostic Tests:  . CT head atrophy, small vessel chronic ischemic changes.  No acute intracranial abnormalities.   Micro Data:  . Bacteria noted   Antimicrobials:  .   Interval History/Subjective  Seems confused.  Reports some neck pain.  No pain in legs.  No other complaints elicited.  Objective   Vitals:  Vitals:   06/05/19 2004 06/06/19 0444  BP: (!) 106/48 (!) 122/53  Pulse: 79 72  Resp: 17 18  Temp: 98.4 F (36.9 C) 98.2 F (36.8 C)  SpO2: 95% 98%    Exam:  Constitutional: Appears calm, comfortable. Eyes: Pupils and irises appear unremarkable. ENT: Appears to be hard of hearing Neck: Strength appears intact.  Moves neck without apparent pain.  Nontender to palpation.  No gross abnormalities noted. Respiratory: Clear to auscultation bilaterally.  No wheezes, rales or rhonchi.  Normal respiratory effort. Cardiovascular: Regular rate and rhythm.  No murmur, rub or gallop.  No lower extremity edema. Abdomen: Soft Skin: Bilateral gangrene of feet noted. Psychiatric: Appears to be confused.  Able to follow simple commands.  I have personally reviewed the following:   Today's Data  . BMP unremarkable . Hemoglobin stable at 10.2. [name] CBG stable   Scheduled Meds: . amLODipine  10 mg Oral Daily  . aspirin EC  81 mg Oral Daily  . atorvastatin  20 mg Oral QHS  . carvedilol  6.25 mg Oral BID WC  . Chlorhexidine Gluconate Cloth  6 each Topical Daily  . cholecalciferol  2,000 Units Oral Daily  . clopidogrel  75 mg Oral Q breakfast  . docusate sodium  100 mg Oral BID  . enoxaparin (LOVENOX)  injection  40 mg Subcutaneous Q24H  . gabapentin  300 mg Oral QPM  . hydrALAZINE  25 mg Oral Q8H  . insulin aspart  0-5 Units Subcutaneous QHS  . insulin aspart  0-9 Units Subcutaneous TID WC  . insulin glargine  15 Units Subcutaneous QHS  . isosorbide dinitrate  30 mg  Oral Daily  . loratadine  10 mg Oral Daily  . magnesium oxide  400 mg Oral BID  . modafinil  200 mg Oral Daily  . mupirocin ointment  1 application Nasal BID  . pantoprazole  40 mg Oral Daily  . potassium chloride SA  20 mEq Oral Daily  . pregabalin  50 mg Oral BID  . sodium chloride flush  10-40 mL Intracatheter Q12H  . torsemide  20 mg Oral BID  . vitamin E  400 Units Oral Daily   Continuous Infusions:   Principal Problem:   PVD (peripheral vascular disease) (HCC) Active Problems:   HTN (hypertension)   HLD (hyperlipidemia)   DM2 (diabetes mellitus, type 2) (HCC)   Chronic diastolic CHF (congestive heart failure) (HCC)   Recurrent UTI   UTI (urinary tract infection)   Pressure injury of skin   Gangrene of toe of both feet ([place])   LOS: 4 days   How to contact the [facility] Attending or Consulting provider 7A - 7P or covering provider during after hours [place], for this patient?  1. Check the care team in [facility] and look for a) attending/consulting TRH provider listed and b) the [facility] team listed 2. Log into [email or web address] and use [place]'s universal password to access. If you do not have the password, please contact the hospital operator. 3. Locate the [facility] provider you are looking for under [facility] and page to a number that you can be directly reached. 4. If you still have difficulty reaching the provider, please page the [facility] (Director on Call) for the Hospitalists listed on amion for assistance.

## 2019-06-06 NOTE — Progress Notes (Signed)
*   Surgery Date in Future *   Subjective/Chief Complaint: Patient seems to be comfortable however states that her legs are heavy. She denies any pain in her toes or feet   Objective: Vital signs in last 24 hours: Temp:  [98.2 F (36.8 C)-98.8 F (37.1 C)] 98.2 F (36.8 C) (11/01 0444) Pulse Rate:  [72-79] 72 (11/01 0444) Resp:  [16-18] 18 (11/01 0444) BP: (106-122)/(48-53) 122/53 (11/01 0444) SpO2:  [95 %-98 %] 98 % (11/01 0444) Last BM Date: 06/03/19  Intake/Output from previous day: 10/31 0701 - 11/01 0700 In: -  Out: 2300 [Urine:2300] Intake/Output this shift: No intake/output data recorded.  General appearance: alert, cooperative and appears stated age Neck: no adenopathy, no carotid bruit, no JVD, supple, symmetrical, trachea midline and thyroid not enlarged, symmetric, no tenderness/mass/nodules Back: symmetric, no curvature. ROM normal. No CVA tenderness. Resp: clear to auscultation bilaterally and normal percussion bilaterally Extremities: Bilateral toes are gangrenous and painted with betadine.  No foul smell is noted. No erythema is noted  Lab Results:  Recent Labs    06/04/19 0533 06/06/19 0625  WBC 10.1 9.7  HGB 10.1* 10.2*  HCT 31.9* 32.8*  PLT 277 297   BMET Recent Labs    06/05/19 0541 06/06/19 0625  NA 140 141  K 3.9 3.7  CL 106 107  CO2 27 30  GLUCOSE 225* 86  BUN 11 8  CREATININE 0.61 0.49  CALCIUM 9.5 9.0   PT/INR No results for input(s): LABPROT, INR in the last 72 hours. ABG No results for input(s): PHART, HCO3 in the last 72 hours.  Invalid input(s): PCO2, PO2  Studies/Results: No results found.  Anti-infectives: Anti-infectives (From admission, onward)   Start     Dose/Rate Route Frequency Ordered Stop   06/04/19 0000  clindamycin (CLEOCIN) IVPB 300 mg  Status:  Discontinued    Note to Pharmacy: To be given in specials   300 mg 100 mL/hr over 30 Minutes Intravenous  Once 06/03/19 2334 06/04/19 0852   06/02/19 2200   meropenem (MERREM) 1 g in sodium chloride 0.9 % 100 mL IVPB  Status:  Discontinued     1 g 200 mL/hr over 30 Minutes Intravenous Every 8 hours 06/02/19 2036 06/03/19 1550      Assessment/Plan: s/p Procedure(s): Lower Extremity Angiography (Left) Planning for cath lab for aortogram with runoff of bilateral lower limbs.   LOS: 4 days    Elmore Guise 06/06/2019

## 2019-06-07 ENCOUNTER — Other Ambulatory Visit (INDEPENDENT_AMBULATORY_CARE_PROVIDER_SITE_OTHER): Payer: Self-pay | Admitting: Vascular Surgery

## 2019-06-07 DIAGNOSIS — I96 Gangrene, not elsewhere classified: Secondary | ICD-10-CM

## 2019-06-07 DIAGNOSIS — E1152 Type 2 diabetes mellitus with diabetic peripheral angiopathy with gangrene: Principal | ICD-10-CM

## 2019-06-07 LAB — GLUCOSE, CAPILLARY
Glucose-Capillary: 171 mg/dL — ABNORMAL HIGH (ref 70–99)
Glucose-Capillary: 139 mg/dL — ABNORMAL HIGH (ref 70–99)
Glucose-Capillary: 220 mg/dL — ABNORMAL HIGH (ref 70–99)
Glucose-Capillary: 199 mg/dL — ABNORMAL HIGH (ref 70–99)

## 2019-06-07 MED ORDER — SODIUM CHLORIDE 0.9 % IV SOLN
INTRAVENOUS | Status: DC
  Administered 2019-06-08: via INTRAVENOUS

## 2019-06-07 NOTE — TOC Progression Note (Signed)
Transition of Care [facility]) - Progression Note    Patient Details  Name: [name] MRN: [id number] Date of Birth: 12-02-1940  Transition of Care [facility] CM/SW Contact  Zailen Albarran, Lenice Llamas Phone Number: [phone]  06/07/2019, 4:23 PM  Clinical Narrative: Per [place] representative they are open to patient for nursing.       Expected Discharge Plan: [place] Barriers to Discharge: Continued Medical Work up  Expected Discharge Plan and Services Expected Discharge Plan: [place]   Discharge Planning Services: CM Consult Post Acute Care Choice: Resumption of Svcs/PTA Provider Living arrangements for the past 2 months: Single Family Home                                       Social Determinants of Health (SDOH) Interventions    Readmission Risk Interventions Readmission Risk Prevention Plan 12/24/2018  [place] or Home Care Consult Complete  Some recent data might be hidden

## 2019-06-07 NOTE — Progress Notes (Signed)
[facility]  Communication Note Plan on left lower extremity angiogram with possible intervention tomorrow with Dr. Delana Meyer. Will pre-op.  Discussed with Dr. Eber Hong Stegmayer PA-C 06/07/2019 9:10 AM

## 2019-06-07 NOTE — Progress Notes (Signed)
PROGRESS NOTE  [name] [id number] DOB: 12/04/1940 DOA: 06/02/2019 PCP: [name], MD  Brief History   78 year old woman presented with recurrent UTI, PMH resistant organisms, has required PICC line for antibiotics in the past.  Admitted for UTI.  Subsequently seen by infectious disease and felt to have asymptomatic bacteriuria.  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  Seen by vascular surgery.  A & P  Gangrenous toes bilateral lower extremities, atherosclerosis bilateral lower extremities.  Plans were for left angiogram but were canceled temporarily given somnolence which resolved.  Follows with vascular surgery as an outpatient, status post angiogram 9/22 which addressed right leg. --Continue conservative measures.  Plan for a left lower extremity angiogram tomorrow with possible intervention.  Asymptomatic bacteriuria, seen by infectious disease with recommendation not to use antibiotics unless symptomatic with fever. --No treatment indicated  Diabetes mellitus type 2 with diabetic neuropathy.  Hemoglobin A1c 8.3. --Remains stable.  Continue Lantus and sliding scale insulin.  Continue Neurontin.  Essential hypertension --Remained stable.  Continue Norvasc and carvedilol  Chronic diastolic CHF --Appears compensated.  Continue torsemide, carvedilol.  PMH mild to moderate aortic stenosis  No abdominal pain on exam.  Significance is unclear but no further evaluation suggested at this point.  DVT prophylaxis: enoxaparin Code Status: Full Family Communication: Discussed with husband at bedside Disposition Plan: Lives at home with husband prior to admission.  Per chart bedbound and has been uses [employer] lift to transfer to wheelchair.  Has all equipment at home.   Murray Hodgkins, MD  [facility] Direct contact: see [email or web address] (further directions at bottom of note if needed) 7PM-7AM contact night coverage as at bottom of note  06/07/2019, 2:14 PM  LOS: 5 days   Significant Hospital Events   . 10/28 admitted for asymptomatic bacteriuria . 10/29 seen by vascular surgery . 10/29 seen by infectious disease   Consults:  [name] Vascular surgery . Infectious disease   Procedures:  .   Significant Diagnostic Tests:  . CT head atrophy, small vessel chronic ischemic changes.  No acute intracranial abnormalities.   Micro Data:  . Bacteria noted   Antimicrobials:  .   Interval History/Subjective  Feels okay today.  Does note some abdominal pain.  Objective   Vitals:  Vitals:   06/07/19 0416 06/07/19 0840  BP: (!) 122/52 (!) 153/64  Pulse: 79 81  Resp: 16   Temp: 98.3 F (36.8 C)   SpO2: 98%     Exam:  Constitutional: Appears calm, comfortable. Respiratory: Clear to auscultation bilaterally.  No wheezes, rales or rhonchi.  Normal respiratory effort. Cardiovascular: Regular rate and rhythm.  No murmur, rub or gallop.  No lower extremity edema. Skin: Bilateral gangrene of the toes noted. Abdomen: Soft, nontender, nondistended. Psychiatric: Appears confused but is quite pleasant.  I have personally reviewed the following:   Today's Data  . CBG stable   Scheduled Meds: . amLODipine  10 mg Oral Daily  . aspirin EC  81 mg Oral Daily  . atorvastatin  20 mg Oral QHS  . carvedilol  6.25 mg Oral BID WC  . Chlorhexidine Gluconate Cloth  6 each Topical Daily  . cholecalciferol  2,000 Units Oral Daily  . clopidogrel  75 mg Oral Q breakfast  . docusate sodium  100 mg Oral BID  . enoxaparin (LOVENOX) injection  40 mg Subcutaneous Q24H  . gabapentin  300 mg Oral QPM  . hydrALAZINE  25 mg Oral Q8H  . insulin aspart  0-5 Units Subcutaneous QHS  . insulin aspart  0-9 Units Subcutaneous TID WC  . insulin glargine  15 Units Subcutaneous QHS  . isosorbide dinitrate  30 mg Oral Daily  . loratadine  10 mg Oral Daily  . magnesium oxide  400 mg Oral BID  . modafinil  200 mg Oral Daily  . mupirocin ointment  1  application Nasal BID  . pantoprazole  40 mg Oral Daily  . potassium chloride SA  20 mEq Oral Daily  . pregabalin  50 mg Oral BID  . sodium chloride flush  10-40 mL Intracatheter Q12H  . torsemide  20 mg Oral BID  . vitamin E  400 Units Oral Daily   Continuous Infusions: . [START ON 06/08/2019] sodium chloride      Principal Problem:   PVD (peripheral vascular disease) (HCC) Active Problems:   HTN (hypertension)   HLD (hyperlipidemia)   DM2 (diabetes mellitus, type 2) (HCC)   Chronic diastolic CHF (congestive heart failure) (HCC)   Recurrent UTI   UTI (urinary tract infection)   Pressure injury of skin   Gangrene of toe of both feet ([place])   LOS: 5 days   How to contact the [facility] Attending or Consulting provider [place] or covering provider during after hours [place], for this patient?  1. Check the care team in [facility] and look for a) attending/consulting TRH provider listed and b) the [facility] team listed 2. Log into [email or web address] and use [place]'s universal password to access. If you do not have the password, please contact the hospital operator. 3. Locate the [facility] provider you are looking for under [facility] and page to a number that you can be directly reached. 4. If you still have difficulty reaching the provider, please page the [facility] (Director on Call) for the Hospitalists listed on amion for assistance.

## 2019-06-07 NOTE — Progress Notes (Signed)
Date of Admission:  06/02/2019     ID: [name] is a 78 y.o. female  Principal Problem:   PVD (peripheral vascular disease) ([place]) Active Problems:   HTN (hypertension)   HLD (hyperlipidemia)   DM2 (diabetes mellitus, type 2) (HCC)   Chronic diastolic CHF (congestive heart failure) (HCC)   Recurrent UTI   UTI (urinary tract infection)   Pressure injury of skin   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)    Subjective:  Doing well Off antibiotics in the last 4 days. Is going for angio tomorrow. Medications:  . amLODipine  10 mg Oral Daily  . aspirin EC  81 mg Oral Daily  . atorvastatin  20 mg Oral QHS  . carvedilol  6.25 mg Oral BID WC  . Chlorhexidine Gluconate Cloth  6 each Topical Daily  . cholecalciferol  2,000 Units Oral Daily  . clopidogrel  75 mg Oral Q breakfast  . docusate sodium  100 mg Oral BID  . gabapentin  300 mg Oral QPM  . hydrALAZINE  25 mg Oral Q8H  . insulin aspart  0-5 Units Subcutaneous QHS  . insulin aspart  0-9 Units Subcutaneous TID WC  . insulin glargine  15 Units Subcutaneous QHS  . isosorbide dinitrate  30 mg Oral Daily  . loratadine  10 mg Oral Daily  . magnesium oxide  400 mg Oral BID  . modafinil  200 mg Oral Daily  . mupirocin ointment  1 application Nasal BID  . pantoprazole  40 mg Oral Daily  . potassium chloride SA  20 mEq Oral Daily  . pregabalin  50 mg Oral BID  . sodium chloride flush  10-40 mL Intracatheter Q12H  . torsemide  20 mg Oral BID  . vitamin E  400 Units Oral Daily    Objective: Vital signs in last 24 hours: Temp:  [98.3 F (36.8 C)-98.4 F (36.9 C)] 98.4 F (36.9 C) (11/02 1516) Pulse Rate:  [77-81] 77 (11/02 1516) Resp:  [16-17] 17 (11/02 1516) BP: (110-153)/(50-64) 110/50 (11/02 1516) SpO2:  [97 %-98 %] 97 % (11/02 1516)  PHYSICAL EXAM:  General: Alert, cooperative, no distress, appears stated age.  Lungs: Clear to auscultation bilaterally. No Wheezing or Rhonchi. No rales. Heart: Regular rate and  rhythm, no murmur, rub or gallop. Abdomen: Soft,  Extremities: Ischemic toes of both feet. The the third toe on the left foot is necrotic.  Not overtly infected.      Skin: No rashes or lesions. Or bruising Lymph: Cervical, supraclavicular normal. Neurologic: Grossly non-focal  Lab Results Recent Labs    06/05/19 0541 06/06/19 0625  WBC  --  9.7  HGB  --  10.2*  HCT  --  32.8*  NA 140 141  K 3.9 3.7  CL 106 107  CO2 27 30  BUN 11 8  CREATININE 0.61 0.49   Liver Panel No results for input(s): PROT, ALBUMIN, AST, ALT, ALKPHOS, BILITOT, BILIDIR, IBILI in the last 72 hours. Sedimentation Rate No results for input(s): ESRSEDRATE in the last 72 hours. C-Reactive Protein No results for input(s): CRP in the last 72 hours.  Microbiology: 06/02/19 ESBl  E.coli Studies/Results: No results found.   Assessment/Plan: Asymptomatic bacteriuria but has been sent from home as UTI as the visiting nurse keeps checking her urine and finding bacteria in the urine.  Patient is totally asymptomatic.  Post void bladder scan did not show any residue.  Discussed with husband that urine should never be  checked routinely every week.  It should only be checked when she has a fever.  Even somnolence or lethargy is not a sign of urinary tract infection.  It could mean that she is dehydrated or constipated.  Or due to medication. I explained to the husband that we will always find bacteria in the urine and pus cells because she is bedbound and her personal hygiene is compromised. Husband understands the situation and has asked me to contact her PCP at [facility].  He will also tell her about not checking urine routinely. stopped meropenem last week  Peripheral artery disease.  Chronic ischemic wounds of the toes.  None seem to be overtly infected.  Dr. Franchot Gallo following her. Surgery planned Observe off antibioitcs  Diabetes mellitus on insulin in the hospital.  Hypertension on amlodipine,  carvedilol and hydralazine.  Patient is on modafinil  Discussed the management with vascular team.  ID will follow peripherally- call if needed

## 2019-06-07 NOTE — Care Management Important Message (Signed)
Important Message  Patient Details  Name: [name] MRN: [id number] Date of Birth: [birth year]   Medicare Important Message Given:  Yes(Due to patient being on isolation RN gave IM.)     Alaa Eyerman, Lenice Llamas 06/07/2019, 4:23 PM

## 2019-06-08 ENCOUNTER — Encounter: Payer: Self-pay | Admitting: Anesthesiology

## 2019-06-08 ENCOUNTER — Encounter: Payer: Self-pay | Admitting: Vascular Surgery

## 2019-06-08 ENCOUNTER — Encounter
Admission: EM | Disposition: A | Discharge status: Home / Self Care | Payer: Self-pay | Source: Ambulatory Visit | Attending: Family Medicine

## 2019-06-08 DIAGNOSIS — Z515 Encounter for palliative care: Secondary | ICD-10-CM

## 2019-06-08 DIAGNOSIS — I739 Peripheral vascular disease, unspecified: Secondary | ICD-10-CM

## 2019-06-08 DIAGNOSIS — I70202 Unspecified atherosclerosis of native arteries of extremities, left leg: Secondary | ICD-10-CM

## 2019-06-08 HISTORY — PX: LOWER EXTREMITY ANGIOGRAPHY: CATH118251

## 2019-06-08 LAB — BASIC METABOLIC PANEL
Anion gap: 7 (ref 5–15)
BUN: 7 mg/dL — ABNORMAL LOW (ref 8–23)
CO2: 29 mmol/L (ref 22–32)
Calcium: 9.3 mg/dL (ref 8.9–10.3)
Chloride: 103 mmol/L (ref 98–111)
Creatinine, Ser: 0.61 mg/dL (ref 0.44–1.00)
GFR calc Af Amer: >60 mL/min (ref >60)
GFR calc non Af Amer: >60 mL/min (ref >60)
Glucose, Bld: 154 mg/dL — ABNORMAL HIGH (ref 70–99)
Potassium: 4 mmol/L (ref 3.5–5.1)
Sodium: 139 mmol/L (ref 135–145)

## 2019-06-08 LAB — MAGNESIUM: Magnesium: 2.1 mg/dL (ref 1.7–2.4)

## 2019-06-08 LAB — GLUCOSE, CAPILLARY
Glucose-Capillary: 157 mg/dL — ABNORMAL HIGH (ref 70–99)
Glucose-Capillary: 152 mg/dL — ABNORMAL HIGH (ref 70–99)
Glucose-Capillary: 175 mg/dL — ABNORMAL HIGH (ref 70–99)
Glucose-Capillary: 290 mg/dL — ABNORMAL HIGH (ref 70–99)
Glucose-Capillary: 294 mg/dL — ABNORMAL HIGH (ref 70–99)

## 2019-06-08 SURGERY — LOWER EXTREMITY ANGIOGRAPHY
Anesthesia: Moderate Sedation | Laterality: Left

## 2019-06-08 MED ORDER — MIDAZOLAM HCL 2 MG/ML PO SYRP
8.0000 mg | ORAL_SOLUTION | Freq: Once | ORAL | Status: DC | PRN
  Filled 2019-06-08: qty 4

## 2019-06-08 MED ORDER — CLINDAMYCIN PHOSPHATE 300 MG/50ML IV SOLN
INTRAVENOUS | Status: AC
  Administered 2019-06-08: 09:00:00 300 mg via INTRAVENOUS
  Filled 2019-06-08: qty 50

## 2019-06-08 MED ORDER — DIPHENHYDRAMINE HCL 50 MG/ML IJ SOLN
50.0000 mg | Freq: Once | INTRAMUSCULAR | Status: AC | PRN
  Administered 2019-06-08: 09:00:00 25 mg via INTRAVENOUS

## 2019-06-08 MED ORDER — SODIUM CHLORIDE 0.9% FLUSH
3.0000 mL | INTRAVENOUS | Status: DC | PRN
  Administered 2019-06-10: 3 mL via INTRAVENOUS
  Filled 2019-06-08: qty 3

## 2019-06-08 MED ORDER — FAMOTIDINE 20 MG PO TABS: 40.0000 mg | ORAL_TABLET | Freq: Once | ORAL | Status: DC | PRN

## 2019-06-08 MED ORDER — METHYLPREDNISOLONE SODIUM SUCC 125 MG IJ SOLR
INTRAMUSCULAR | Status: AC
  Administered 2019-06-08: 09:00:00 125 mg via INTRAVENOUS
  Filled 2019-06-08: qty 2

## 2019-06-08 MED ORDER — HEPARIN SODIUM (PORCINE) 1000 UNIT/ML IJ SOLN
INTRAMUSCULAR | Status: DC | PRN
  Administered 2019-06-08: 5000 [IU] via INTRAVENOUS

## 2019-06-08 MED ORDER — HEPARIN SODIUM (PORCINE) 1000 UNIT/ML IJ SOLN
INTRAMUSCULAR | Status: AC
  Filled 2019-06-08: qty 1

## 2019-06-08 MED ORDER — METHYLPREDNISOLONE SODIUM SUCC 125 MG IJ SOLR
125.0000 mg | Freq: Once | INTRAMUSCULAR | Status: AC | PRN
  Administered 2019-06-08: 09:00:00 125 mg via INTRAVENOUS

## 2019-06-08 MED ORDER — SODIUM CHLORIDE 0.9 % IV SOLN: INTRAVENOUS | Status: DC

## 2019-06-08 MED ORDER — CLINDAMYCIN PHOSPHATE 300 MG/50ML IV SOLN
300.0000 mg | Freq: Once | INTRAVENOUS | Status: AC
  Administered 2019-06-08 (×2): 300 mg via INTRAVENOUS
  Filled 2019-06-08: qty 50

## 2019-06-08 MED ORDER — IODIXANOL 320 MG/ML IV SOLN
INTRAVENOUS | Status: DC | PRN
  Administered 2019-06-08: 10:00:00 110 mL via INTRA_ARTERIAL

## 2019-06-08 MED ORDER — ONDANSETRON HCL 4 MG/2ML IJ SOLN: 4.0000 mg | Freq: Four times a day (QID) | INTRAMUSCULAR | Status: DC | PRN

## 2019-06-08 MED ORDER — SODIUM CHLORIDE 0.9 % IV SOLN
INTRAVENOUS | Status: DC
  Administered 2019-06-09 – 2019-06-11 (×4): via INTRAVENOUS

## 2019-06-08 MED ORDER — MIDAZOLAM HCL 2 MG/2ML IJ SOLN
INTRAMUSCULAR | Status: DC | PRN
  Administered 2019-06-08: 1 mg via INTRAVENOUS

## 2019-06-08 MED ORDER — DIPHENHYDRAMINE HCL 50 MG/ML IJ SOLN
INTRAMUSCULAR | Status: AC
  Administered 2019-06-08: 09:00:00 25 mg via INTRAVENOUS
  Filled 2019-06-08: qty 1

## 2019-06-08 MED ORDER — FENTANYL CITRATE (PF) 100 MCG/2ML IJ SOLN
INTRAMUSCULAR | Status: AC
  Filled 2019-06-08: qty 2

## 2019-06-08 MED ORDER — SODIUM CHLORIDE 0.9 % IV SOLN: 250.0000 mL | INTRAVENOUS | Status: DC | PRN

## 2019-06-08 MED ORDER — HYDROMORPHONE HCL 1 MG/ML IJ SOLN: 1.0000 mg | Freq: Once | INTRAMUSCULAR | Status: DC | PRN

## 2019-06-08 MED ORDER — SODIUM CHLORIDE 0.9% FLUSH
3.0000 mL | Freq: Two times a day (BID) | INTRAVENOUS | Status: DC
  Administered 2019-06-10 – 2019-06-11 (×3): 3 mL via INTRAVENOUS

## 2019-06-08 MED ORDER — FENTANYL CITRATE (PF) 100 MCG/2ML IJ SOLN
INTRAMUSCULAR | Status: DC | PRN
  Administered 2019-06-08: 50 ug via INTRAVENOUS

## 2019-06-08 MED ORDER — FAMOTIDINE 20 MG PO TABS
ORAL_TABLET | ORAL | Status: AC
  Filled 2019-06-08: qty 2

## 2019-06-08 MED ORDER — MIDAZOLAM HCL 2 MG/2ML IJ SOLN
INTRAMUSCULAR | Status: AC
  Filled 2019-06-08: qty 4

## 2019-06-08 SURGICAL SUPPLY — 21 items
BALLN LUTONIX 018 4X150X130 (BALLOONS) ×3
BALLN ULTRASCORE 014 3X100X150 (BALLOONS) ×3
BALLN ULTRVRSE 2X100X150 (BALLOONS) ×3
BALLOON LUTONIX 018 4X150X130 (BALLOONS) ×1 IMPLANT
BALLOON ULTRSCRE 014 3X100X150 (BALLOONS) ×1 IMPLANT
BALLOON ULTRVRSE 2X100X150 (BALLOONS) ×1 IMPLANT
CATH PIG 70CM (CATHETERS) ×3 IMPLANT
CATH VERT 5FR 125CM (CATHETERS) ×3 IMPLANT
DEVICE PRESTO INFLATION (MISCELLANEOUS) ×3 IMPLANT
DEVICE STARCLOSE SE CLOSURE (Vascular Products) ×3 IMPLANT
GLIDEWIRE ADV .035X260CM (WIRE) ×3 IMPLANT
NEEDLE ENTRY 21GA 7CM ECHOTIP (NEEDLE) ×3 IMPLANT
PACK ANGIOGRAPHY (CUSTOM PROCEDURE TRAY) ×3 IMPLANT
SET INTRO CAPELLA COAXIAL (SET/KITS/TRAYS/PACK) ×3 IMPLANT
SHEATH BRITE TIP 5FRX11 (SHEATH) ×3 IMPLANT
SHEATH RAABE 6FR (SHEATH) ×3 IMPLANT
SYR MEDRAD MARK 7 150ML (SYRINGE) ×3 IMPLANT
TUBING CONTRAST HIGH PRESS 72 (TUBING) ×3 IMPLANT
WIRE G V18X300CM (WIRE) ×3 IMPLANT
WIRE J 3MM .035X145CM (WIRE) ×3 IMPLANT
WIRE RUNTHROUGH .014X300CM (WIRE) ×3 IMPLANT

## 2019-06-08 NOTE — Progress Notes (Signed)
PROGRESS NOTE  [name] [id number] DOB: 04-11-1941 DOA: 06/02/2019 PCP: [name], MD  Brief History   78 year old woman presented with recurrent UTI, PMH resistant organisms, has required PICC line for antibiotics in the past.  Admitted for UTI.  Subsequently seen by infectious disease and felt to have asymptomatic bacteriuria.  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  Seen by vascular surgery.  Plan was made for angiogram but this had to be canceled secondary to acute encephalopathy.  This was performed 11/3 with successful recannulization left lower extremity for limb salvage.  Surgery is planned for tomorrow for amputation.  Thereafter should be able to go home when cleared by vascular surgery.  A & P  Gangrenous toes bilateral lower extremities, atherosclerosis bilateral lower extremities.  Plans were for left angiogram but were canceled temporarily given somnolence which resolved.   --Underwent recannulization left lower extremity for limb salvage 11/3. --Plan for surgery tomorrow  Asymptomatic bacteriuria, seen by infectious disease with recommendation not to use antibiotics unless symptomatic with fever. --No treatment indicated  Diabetes mellitus type 2 with diabetic neuropathy.  Hemoglobin A1c 8.3. --Remains stable.  Continue Lantus, sliding scale insulin, Neurontin.  Essential hypertension --Somewhat labile.  Continue amlodipine and carvedilol.  Chronic diastolic CHF --Appears stable.  Continue torsemide and carvedilol.  PMH mild to moderate aortic stenosis  Plan for surgery tomorrow.  Home when cleared by vascular surgery.  DVT prophylaxis: enoxaparin Code Status: Full Family Communication: None at bedside Disposition Plan: Lives at home with husband prior to admission.  Per chart bedbound and has been uses [employer] lift to transfer to wheelchair.  Has all equipment at home.   Murray Hodgkins, MD  [facility]  Direct contact: see [email or web address] (further directions at bottom of note if needed) 7PM-7AM contact night coverage as at bottom of note 06/08/2019, 3:13 PM  LOS: 6 days   Significant Hospital Events   . 10/28 admitted for asymptomatic bacteriuria . 10/29 seen by vascular surgery . 10/29 seen by infectious disease   Consults:  [name] Vascular surgery . Infectious disease   Procedures:  11/3 Percutaneous transluminal angioplasty left superficial femoral and popliteal arteries to 4 mm with Lutonix drug-eluting balloon4. Percutaneous transluminal angioplasty left anterior tibial to 3 mm with an ultra score balloon  Significant Diagnostic Tests:  . CT head atrophy, small vessel chronic ischemic changes.  No acute intracranial abnormalities.   Micro Data:  .    Antimicrobials:  .   Interval History/Subjective  Feels okay today.  Some leg pain.  Objective   Vitals:  Vitals:   06/08/19 1130 06/08/19 1218  BP: (!) 123/59 (!) 156/52  Pulse: 78 79  Resp: 13 17  Temp:  97.8 F (36.6 C)  SpO2: 100% 92%    Exam:  Constitutional.  Appears calm, comfortable. Psychiatric.  Grossly normal mood and affect.  Speech fluent.  Mildly confused. Respiratory.  Clear to auscultation bilaterally.  No wheezes, rales or rhonchi.  Normal respiratory effort. Cardiovascular.  Regular rate and rhythm.  No murmur, rub or gallop.  No significant lower extremity edema. Skin.  Bilateral changes and gangrene in both feet.  I have personally reviewed the following:   Today's Data  . CBG stable   Scheduled Meds: . amLODipine  10 mg Oral Daily  . aspirin EC  81 mg Oral Daily  . atorvastatin  20 mg Oral QHS  . carvedilol  6.25 mg Oral BID WC  . Chlorhexidine Gluconate Cloth  6  each Topical Daily  . cholecalciferol  2,000 Units Oral Daily  . clopidogrel  75 mg Oral Q breakfast  . docusate sodium  100 mg Oral BID  . fentaNYL      . gabapentin  300 mg Oral QPM  . heparin      . hydrALAZINE  25 mg Oral  Q8H  . insulin aspart  0-5 Units Subcutaneous QHS  . insulin aspart  0-9 Units Subcutaneous TID WC  . insulin glargine  15 Units Subcutaneous QHS  . isosorbide dinitrate  30 mg Oral Daily  . loratadine  10 mg Oral Daily  . magnesium oxide  400 mg Oral BID  . midazolam      . modafinil  200 mg Oral Daily  . mupirocin ointment  1 application Nasal BID  . pantoprazole  40 mg Oral Daily  . potassium chloride SA  20 mEq Oral Daily  . pregabalin  50 mg Oral BID  . sodium chloride flush  10-40 mL Intracatheter Q12H  . sodium chloride flush  3 mL Intravenous Q12H  . torsemide  20 mg Oral BID  . vitamin E  400 Units Oral Daily   Continuous Infusions: . sodium chloride    . sodium chloride    . [START ON 06/09/2019] sodium chloride      Principal Problem:   PVD (peripheral vascular disease) (HCC) Active Problems:   HTN (hypertension)   HLD (hyperlipidemia)   DM2 (diabetes mellitus, type 2) (HCC)   Chronic diastolic CHF (congestive heart failure) (HCC)   Recurrent UTI   UTI (urinary tract infection)   Pressure injury of skin   Gangrene of toe of both feet ([place])   LOS: 6 days   How to contact the [facility] Attending or Consulting provider [place] or covering provider during after hours [place], for this patient?  1. Check the care team in [facility] and look for a) attending/consulting [facility] provider listed and b) the [facility] team listed 2. Log into [email or web address] and use 's universal password to access. If you do not have the password, please contact the hospital operator. 3. Locate the [facility] provider you are looking for under [facility] and page to a number that you can be directly reached. 4. If you still have difficulty reaching the provider, please page the [facility] (Director on Call) for the Hospitalists listed on amion for assistance.

## 2019-06-08 NOTE — Op Note (Signed)
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 Introduction catheter into left lower extremity 3rd order catheter placement  2. Contrast injection left lower extremity for distal runoff  3. Percutaneous transluminal angioplasty left superficial femoral and popliteal arteries to 4 mm with Lutonix drug-eluting balloon 4. Percutaneous transluminal angioplasty left anterior tibial to 3 mm with an ultra score balloon  5. Star close closure right common femoral arteriotomy  Anesthesia: Conscious sedation was administered under my direct supervision by the interventional radiology RN. IV Versed plus fentanyl were utilized. Continuous ECG, pulse oximetry and blood pressure was monitored throughout the entire procedure.  Conscious sedation was for a total of 1 hour 17 minutes.  Sheath: 6 [place]aby right common femoral retrograde  Contrast: 110 cc  Fluoroscopy Time: 8.9 minutes  Indications: [name] presents with increasing pain of the left lower extremity.  She also has necrosis of the left third toe.  Previous intervention resulted in a palpable DP pulse.  However, at this point the pulses no longer palpable and ABI suggest recurrence of her tibial disease.  This suggests the patient is having limb threatening ischemia. The risks and benefits are reviewed all questions answered patient agrees to proceed.  Procedure:[name] is a 78 y.o. y.o. female who was identified and appropriate procedural time out was performed. The patient was then placed supine on the table and prepped and draped in the usual sterile fashion.   Ultrasound was placed in the  sterile sleeve and the right groin was evaluated the right common femoral artery was echolucent and pulsatile indicating patency. Image was recorded for the permanent record and under real-time visualization a microneedle was inserted into the common femoral artery followed by the microwire and then the micro-sheath. A J-wire was then advanced through the micro-sheath and a 5 [place] sheath was then inserted over a J-wire. J-wire was then advanced and a 5 French pigtail catheter was positioned at the level of the aortic bifurcation the catheter and wire were advanced down into the left distal external iliac artery. Oblique view of the femoral bifurcation was then obtained and subsequently the wire was reintroduced and the pigtail catheter negotiated into the SFA representing third order catheter placement. Distal runoff was then performed.  Diagnostic interpretation: The distal aorta is opacified with a bolus injection of contrast as is the common and external iliac arteries.  These are widely patent there is no evidence of hemodynamically significant disease this is unchanged compared to the previous study.  The left common femoral and profunda femoris are widely patent.  No hemodynamically significant lesion is noted.  The SFA is widely patent its proximal two thirds however at Hunter's canal there is a greater than 70% stenosis.  There is then diffuse disease with several areas of greater than 70% stenosis within the mid and distal popliteal.  The tibioperoneal trunk posterior tibial and peroneal are all occluded and there is no evidence of reconstitution distally.  The anterior tibial is patent at its origin but then tapers to an occlusion in its proximal one third.  The mid anterior tibial is reconstituted and is patent down to the foot filling the dorsalis pedis and the pedal arch.  5000 units of heparin was then given and allowed to circulate and a 6 [place] Raby sheath was  advanced up and over the  bifurcation and positioned in the femoral artery  Kumpe catheter and stiff angle Glidewire were then negotiated down into the distal popliteal. Catheter was then advanced into the origin of the anterior tibial.. Hand injection contrast demonstrated the tibial anatomy in detail.  A V 18 wire was then introduced and under roadmap negotiated through the occlusion.  A 2 mm x 10 cm Ultraverse balloon was used to angioplasty the anterior tibial.  Next, a 0.014 run-through wire is advanced through the balloon and positioned with the tip in the dorsalis pedis a 3 mm x 10 cm ultra score balloon is then advanced across the stenosis of the anterior tibial artery to serial inflations were made both to 10 atm for 1 minute each.  Follow-up imaging demonstrated excellent patency with less than 10% residual stenosis and with preservation of the distal runoff.  The detector was then repositioned and the SFA and popliteal was reimaged greater than 70% stenosis at Hunter's canal is again noted and after appropriate measurements are made a 4 mm x 150 mm Lutonix balloon was used to angioplasty the superficial femoral and popliteal arteries. Inflation was to 12 atmospheres for 2 minutes. Follow-up imaging demonstrated patency with excellent result and less than 10% residual stenosis.  This was confirmed and a second oblique view.  Distal runoff was then reassessed and noted to be widely patent.   After review of these images the sheath is pulled into the right external iliac oblique of the common femoral is obtained and a Star close device deployed. There no immediate Complications.  Findings:  The distal aorta is opacified with a bolus injection of contrast as is the common and external iliac arteries.  These are widely patent there is no evidence of hemodynamically significant disease this is unchanged compared to the previous study.  The left common femoral and profunda femoris are widely patent.  No hemodynamically  significant lesion is noted.  The SFA is widely patent its proximal two thirds however at Hunter's canal there is a greater than 70% stenosis.  There is then diffuse disease with several areas of greater than 70% stenosis within the mid and distal popliteal.  The tibioperoneal trunk posterior tibial and peroneal are all occluded and there is no evidence of reconstitution distally.  The anterior tibial is patent at its origin but then tapers to an occlusion in its proximal one third.  The mid anterior tibial is reconstituted and is patent down to the foot filling the dorsalis pedis and the pedal arch.  Following angioplasty anterior tibial now is in-line flow and looks quite nice with less than 10% residual stenosis after maximal dilatation to 3 mm. Angioplasty of the SFA to 4 mm with Lutonix drug-eluting balloon at Hunter's canal yields an excellent result with less than 10% residual stenosis.  Summary: Successful recanalization left lower extremity for limb salvage   Disposition: Patient was taken to the recovery room in stable condition having tolerated the procedure well.  [name] 06/08/2019,9:58 AM

## 2019-06-08 NOTE — Consult Note (Signed)
Palliative Medicine   Name: [name] Date: 06/08/2019 MRN: [id number]  DOB: Apr 23, 1941  Patient Care Team: [name], MD as PCP - General (Family Medicine) Danie Binder, MD as Consulting Physician (Gastroenterology) Kerry Kass, MD as Referring Physician (Surgery) Donato Schultz, MD as Referring Physician Corey Skains, MD as Consulting Physician (Cardiology) Julianne Handler, NP as Nurse Practitioner (Hospice and Palliative Medicine)    REASON FOR CONSULTATION: Palliative Care consult requested for this 78 y.o. female with multiple medical problems including PAD, diabetes, peripheral neuropathy, history of CVA, diastolic dysfunction with history of CHF, history of ESBL E. coli UTIs, who was admitted to the hospital on 06/02/2019 with recurrent UTI.  Hospitalization has been complicated by bilateral gangrenous toes secondary to severe peripheral vascular disease.  On 06/08/2019 patient underwent PTA of the left superficial femoral and popliteal arteries and left anterior tibial.  Palliative care was consulted to help address goals.  SOCIAL HISTORY:     reports that she has never smoked. She has never used smokeless tobacco. She reports that she does not drink alcohol or use drugs.   Patient is married and lives at home with her husband.  She has a son and daughter, both of whom are involved in her care.  Patient previously worked as a [employer] manager.  ADVANCE DIRECTIVES:  Not on file.  Reportedly, patient's daughter is her financial POA  CODE STATUS:   PAST MEDICAL HISTORY: Past Medical History:  Diagnosis Date  . Acute heart failure ([place])   . Aortic stenosis   . CHF (congestive heart failure) ([place])   . Complication of anesthesia    Hard to wake patient up after having anesthesia  . Diabetes mellitus without complication ([place])   . Diabetic neuropathy ([facility])    Takes Lyrica  . Edema of both legs    Takes Lasix  . GERD (gastroesophageal reflux disease)   . High  cholesterol   . HTN (hypertension)   . Hypertension   . PAD (peripheral artery disease) ([place])   . Shortness of breath dyspnea   . Spasm of back muscles   . Stroke ([place])   . Wound, open    Right great toe    PAST SURGICAL HISTORY:  Past Surgical History:  Procedure Laterality Date  . BYPASS GRAFT POPLITEAL TO TIBIAL Right 02/28/2016   Procedure: BYPASS GRAFT RIGHT BELOW KNEE POPLITEAL TO PERONEAL USING REVERSED RIGHT GREATER SAPHENOUS VEIN;  Surgeon: Conrad Herndon, MD;  Location: [place];  Service: Vascular;  Laterality: Right;  . CYST EXCISION     Abdomen  . EYE SURGERY Bilateral    Cataract removal  . INTRAMEDULLARY (IM) NAIL INTERTROCHANTERIC Left 02/04/2014   Procedure: INTRAMEDULLARY (IM) NAIL INTERTROCHANTRIC FEMORAL;  Surgeon: Mauri Pole, MD;  Location: [place];  Service: Orthopedics;  Laterality: Left;  . IR GENERIC HISTORICAL  03/01/2016   IR ANGIO INTRA EXTRACRAN SEL COM CAROTID INNOMINATE UNI R MOD SED 03/01/2016 [name], MD [facility]  . IR GENERIC HISTORICAL  03/01/2016   IR ENDOVASC INTRACRANIAL INF OTHER THAN THROMBO ART INC DIAG ANGIO 03/01/2016 [name], MD [facility]  . IR GENERIC HISTORICAL  03/01/2016   IR INTRAVSC STENT CERV CAROTID W/O EMB-PROT MOD SED INC ANGIO 03/01/2016 [name], MD [facility]  . IR GENERIC HISTORICAL  06/03/2016   IR RADIOLOGIST EVAL & MGMT 06/03/2016 [facility]  . LOWER EXTREMITY ANGIOGRAPHY Left 02/09/2019   Procedure: LOWER EXTREMITY ANGIOGRAPHY;  Surgeon: Katha Cabal, MD;  Location: [place] [facility];  Service: Cardiovascular;  Laterality: Left;  . LOWER EXTREMITY ANGIOGRAPHY Left 03/10/2019   Procedure: LOWER EXTREMITY ANGIOGRAPHY;  Surgeon: Katha Cabal, MD;  Location: [facility];  Service: Cardiovascular;  Laterality: Left;  . LOWER EXTREMITY ANGIOGRAPHY Left 04/27/2019   Procedure: LOWER EXTREMITY ANGIOGRAPHY;  Surgeon: Katha Cabal, MD;  Location: [place] [facility];   Service: Cardiovascular;  Laterality: Left;  . LOWER EXTREMITY ANGIOGRAPHY Left 06/08/2019   Procedure: Lower Extremity Angiography;  Surgeon: Katha Cabal, MD;  Location: [place] [facility];  Service: Cardiovascular;  Laterality: Left;  . ORIF TOE FRACTURE Right 02/08/2014   Procedure: OPEN REDUCTION INTERNAL FIXATION Right METATARSAL  FRACTURE ;  Surgeon: Wylene Simmer, MD;  Location: [place];  Service: Orthopedics;  Laterality: Right;  . PERIPHERAL VASCULAR CATHETERIZATION N/A 12/28/2015   Procedure: Abdominal Aortogram w/Lower Extremity;  Surgeon: Conrad Santa Claus, MD;  Location: [place] [facility];  Service: Cardiovascular;  Laterality: N/A;  . RADIOLOGY WITH ANESTHESIA N/A 03/01/2016   Procedure: RADIOLOGY WITH ANESTHESIA;  Surgeon: [name], MD;  Location: [place];  Service: Radiology;  Laterality: N/A;  . VEIN HARVEST Right 02/28/2016   Procedure: RIGHT GREATER SAPHENOUS VEIN HARVEST;  Surgeon: Conrad Pearisburg, MD;  Location: [place];  Service: Vascular;  Laterality: Right;    HEMATOLOGY/ONCOLOGY HISTORY:  Oncology History   No history exists.    ALLERGIES:  is allergic to iodine; penicillins; shellfish allergy; sulfa antibiotics; sulfacetamide sodium; sulfasalazine; eggs or egg-derived products; and morphine and related.  MEDICATIONS:  Current Facility-Administered Medications  Medication Dose Route Frequency Provider Last Rate Last Dose  . 0.9 %  sodium chloride infusion   Intravenous Continuous Schnier, Dolores Lory, MD      . 0.9 %  sodium chloride infusion  250 mL Intravenous PRN Schnier, Dolores Lory, MD      . Derrill Memo ON 06/09/2019] 0.9 %  sodium chloride infusion   Intravenous Continuous Stegmayer, Kimberly A, PA-C      . acetaminophen (TYLENOL) tablet 650 mg  650 mg Oral Q6H PRN Schnier, Dolores Lory, MD       Or  . acetaminophen (TYLENOL) suppository 650 mg  650 mg Rectal Q6H PRN Schnier, Dolores Lory, MD      . albuterol (PROVENTIL) (2.5 MG/3ML) 0.083% nebulizer solution 2.5 mg  2.5 mg  Inhalation Q6H PRN Schnier, Dolores Lory, MD      . amLODipine (NORVASC) tablet 10 mg  10 mg Oral Daily Schnier, Dolores Lory, MD   10 mg at 06/07/19 0842  . aspirin EC tablet 81 mg  81 mg Oral Daily Schnier, Dolores Lory, MD   Stopped at 06/09/19 1000  . atorvastatin (LIPITOR) tablet 20 mg  20 mg Oral QHS Schnier, Dolores Lory, MD   20 mg at 06/07/19 2127  . carvedilol (COREG) tablet 6.25 mg  6.25 mg Oral BID WC Schnier, Dolores Lory, MD   6.25 mg at 06/07/19 1728  . Chlorhexidine Gluconate Cloth 2 % PADS 6 each  6 each Topical Daily Schnier, Dolores Lory, MD   6 each at 06/07/19 [phone]  . cholecalciferol (VITAMIN D) tablet 2,000 Units  2,000 Units Oral Daily Schnier, Dolores Lory, MD   2,000 Units at 06/07/19 [phone]  . clopidogrel (PLAVIX) tablet 75 mg  75 mg Oral Q breakfast Schnier, Dolores Lory, MD   Stopped at 06/09/19 0800  . docusate sodium (COLACE) capsule 100 mg  100 mg  Oral BID Delana Meyer Dolores Lory, MD   100 mg at 06/07/19 2127  . fentaNYL (SUBLIMAZE) 100 MCG/2ML injection           . fluticasone (FLONASE) 50 MCG/ACT nasal spray 1 spray  1 spray Each Nare Daily PRN Schnier, Dolores Lory, MD      . gabapentin (NEURONTIN) capsule 300 mg  300 mg Oral QPM Schnier, Dolores Lory, MD   300 mg at 06/07/19 1728  . heparin 1000 UNIT/ML injection           . hydrALAZINE (APRESOLINE) tablet 25 mg  25 mg Oral Q8H Schnier, Dolores Lory, MD   25 mg at 06/08/19 1247  . HYDROmorphone (DILAUDID) injection 1 mg  1 mg Intravenous Once PRN Schnier, Dolores Lory, MD      . insulin aspart (novoLOG) injection 0-5 Units  0-5 Units Subcutaneous QHS Schnier, Dolores Lory, MD   3 Units at 06/06/19 2125  . insulin aspart (novoLOG) injection 0-9 Units  0-9 Units Subcutaneous TID WC Schnier, Dolores Lory, MD   2 Units at 06/08/19 1247  . insulin glargine (LANTUS) injection 15 Units  15 Units Subcutaneous QHS Schnier, Dolores Lory, MD   15 Units at 06/07/19 2126  . isosorbide dinitrate (ISORDIL) tablet 30 mg  30 mg Oral Daily Schnier, Dolores Lory, MD   30 mg at 06/07/19  0841  . loratadine (CLARITIN) tablet 10 mg  10 mg Oral Daily Schnier, Dolores Lory, MD   10 mg at 06/07/19 0842  . magnesium oxide (MAG-OX) tablet 400 mg  400 mg Oral BID Delana Meyer Dolores Lory, MD   400 mg at 06/07/19 2127  . midazolam (VERSED) 2 MG/2ML injection           . modafinil (PROVIGIL) tablet 200 mg  200 mg Oral Daily Schnier, Dolores Lory, MD   200 mg at 06/07/19 0841  . mupirocin ointment (BACTROBAN) 2 % 1 application  1 application Nasal BID Ojie, Jude, MD   1 application at 14/23/95 2127  . nitroGLYCERIN (NITROSTAT) SL tablet 0.4 mg  0.4 mg Sublingual Q5 min PRN Schnier, Dolores Lory, MD      . ondansetron [facility]) tablet 4 mg  4 mg Oral Q6H PRN Schnier, Dolores Lory, MD       Or  . ondansetron [facility]) injection 4 mg  4 mg Intravenous Q6H PRN Schnier, Dolores Lory, MD      . ondansetron [facility]) injection 4 mg  4 mg Intravenous Q6H PRN Schnier, Dolores Lory, MD      . ondansetron [facility]) injection 4 mg  4 mg Intravenous Q6H PRN Schnier, Dolores Lory, MD      . pantoprazole (PROTONIX) EC tablet 40 mg  40 mg Oral Daily Schnier, Dolores Lory, MD   40 mg at 06/07/19 0841  . polyethylene glycol (MIRALAX / GLYCOLAX) packet 17 g  17 g Oral Daily PRN Schnier, Dolores Lory, MD   17 g at 06/07/19 0839  . potassium chloride SA (KLOR-CON) CR tablet 20 mEq  20 mEq Oral Daily Schnier, Dolores Lory, MD   20 mEq at 06/07/19 0841  . pregabalin (LYRICA) capsule 50 mg  50 mg Oral BID Delana Meyer Dolores Lory, MD   50 mg at 06/07/19 2127  . sodium chloride flush (NS) 0.9 % injection 10-40 mL  10-40 mL Intracatheter Q12H Schnier, Dolores Lory, MD   10 mL at 06/07/19 2355  . sodium chloride flush (NS) 0.9 % injection 10-40 mL  10-40 mL Intracatheter PRN Schnier, Dolores Lory,  MD      . sodium chloride flush (NS) 0.9 % injection 3 mL  3 mL Intravenous Q12H Schnier, Dolores Lory, MD      . sodium chloride flush (NS) 0.9 % injection 3 mL  3 mL Intravenous PRN Schnier, Dolores Lory, MD      . torsemide (DEMADEX) tablet 20 mg  20 mg Oral BID Schnier,  Dolores Lory, MD   20 mg at 06/07/19 1728  . vitamin E capsule 400 Units  400 Units Oral Daily Schnier, Dolores Lory, MD   400 Units at 06/07/19 0841    VITAL SIGNS: BP (!) 156/52 (BP Location: Right Arm)   Pulse 79   Temp 97.8 F (36.6 C)   Resp 17   Ht _0  (1.626 m)   Wt 163 lb (73.9 kg)   SpO2 92%   BMI 27.98 kg/m  Filed Weights   06/02/19 1843  Weight: 163 lb (73.9 kg)    Estimated body mass index is 27.98 kg/m as calculated from the following:   Height as of this encounter: _1  (1.626 m).   Weight as of this encounter: 163 lb (73.9 kg).  LABS: CBC:    Component Value Date/Time   WBC 9.7 06/06/2019 0625   HGB 10.2 (L) 06/06/2019 0625   HCT 32.8 (L) 06/06/2019 0625   PLT 297 06/06/2019 0625   MCV 82.4 06/06/2019 0625   NEUTROABS 9.5 (H) 12/26/2018 0449   LYMPHSABS 1.4 12/26/2018 0449   MONOABS 0.8 12/26/2018 0449   EOSABS 0.1 12/26/2018 0449   BASOSABS 0.0 12/26/2018 0449   Comprehensive Metabolic Panel:    Component Value Date/Time   NA 139 06/08/2019 0538   K 4.0 06/08/2019 0538   CL 103 06/08/2019 0538   CO2 29 06/08/2019 0538   BUN 7 (L) 06/08/2019 0538   CREATININE 0.61 06/08/2019 0538   GLUCOSE 154 (H) 06/08/2019 0538   CALCIUM 9.3 06/08/2019 0538   AST 18 06/02/2019 2149   ALT 18 06/02/2019 2149   ALKPHOS 70 06/02/2019 2149   BILITOT 0.5 06/02/2019 2149   PROT 6.7 06/02/2019 2149   ALBUMIN 3.3 (L) 06/02/2019 2149    RADIOGRAPHIC STUDIES: Ct Head Wo Contrast  Result Date: 06/04/2019 CLINICAL DATA:  Encephalopathy, history CHF, diabetes mellitus, hypertension, stroke EXAM: CT HEAD WITHOUT CONTRAST TECHNIQUE: Contiguous axial images were obtained from the base of the skull through the vertex without intravenous contrast. COMPARISON:  12/23/2018 FINDINGS: Brain: Generalized atrophy. Normal ventricular morphology for degree of atrophy. No midline shift or mass effect. 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 Calcified mass at floor of RIGHT  posterior fossa 11 x 11 x 7 mm likely calcified meningioma unchanged. No intracranial hemorrhage, additional mass lesion or evidence of acute infarction. No extra-axial fluid collections. Vascular: Atherosclerotic calcification of internal carotid and vertebral arteries at skull base Skull: Demineralized but intact Sinuses/Orbits: Clear Other: N/A IMPRESSION: Atrophy with small vessel chronic ischemic changes of deep cerebral white matter. 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 No acute intracranial abnormalities. Electronically Signed   By: Lavonia Dana M.D.   On: 06/04/2019 10:40   Vas [place] Burnard Bunting With/wo Tbi  Result Date: 05/31/2019 LOWER EXTREMITY DOPPLER STUDY Indications: Ulceration, gangrene, and peripheral artery disease. Other Factors: 03/10/2019 Left PTA popliteal and ATA.  Vascular Interventions: 04/27/2019: Ultrasound Guided access to the Distal Right  Anterior Tibial Artery. PTA of the Right SFA and                         Popliteal Artery. Crosser Atherectomy with PTA of the                         Right ATA. Comparison Study: 03/29/2019 Performing Technologist: Almira Coaster RVS  Examination Guidelines: A complete evaluation includes at minimum, Doppler waveform signals and systolic blood pressure reading at the level of bilateral brachial, anterior tibial, and posterior tibial arteries, when vessel segments are accessible. Bilateral testing is considered an integral part of a complete examination. Photoelectric Plethysmograph (PPG) waveforms and toe systolic pressure readings are included as required and additional duplex testing as needed. Limited examinations for reoccurring indications may be performed as noted.  ABI Findings: +--------+------------------+-----+----------+--------+ Right   Rt Pressure (mmHg)IndexWaveform  Comment  +--------+------------------+-----+----------+--------+ [id number]                                        +--------+------------------+-----+----------+--------+ ATA     141               0.69 monophasic         +--------+------------------+-----+----------+--------+ PTA     0                 0.00 absent             +--------+------------------+-----+----------+--------+ +--------+------------------+-----+----------+-------+ Left    Lt Pressure (mmHg)IndexWaveform  Comment +--------+------------------+-----+----------+-------+ [id number]                                      +--------+------------------+-----+----------+-------+ ATA     96                0.47 monophasic        +--------+------------------+-----+----------+-------+ PTA     84                0.41 monophasic        +--------+------------------+-----+----------+-------+ +-------+-----------+-----------+------------+------------+ ABI/TBIToday's ABIToday's TBIPrevious ABIPrevious TBI +-------+-----------+-----------+------------+------------+ Right  .69                   .52         .14          +-------+-----------+-----------+------------+------------+ Left   .47                   .87         .45          +-------+-----------+-----------+------------+------------+ No TBI's obtained due to Ulcers on all toes. Left ABIs appear decreased compared to prior study on 03/29/2019. Right ABIs appear increased compared to prior study on 03/29/2019.  Summary: Right: Resting right ankle-brachial index indicates moderate right lower extremity arterial disease. No Right PTA seen while Imaging Rt Ankle level. Left: Resting left ankle-brachial index indicates severe left lower extremity arterial disease.  *See table(s) above for measurements and observations.  Electronically signed by Hortencia Pilar MD on 05/31/2019 at 4:48:14 PM.    Final     PERFORMANCE STATUS (ECOG) : 4 - Bedbound  Review of Systems Unless otherwise noted, a complete review of systems is negative.   Physical Exam General: NAD, frail appearing, thin Pulmonary: Unlabored Extremities: no  edema, no joint deformities Skin: Wounds noted to bilateral toes Neurological: Weakness, some confusion  IMPRESSION: I met with patient and her husband.  Patient had percutaneous angioplasty of the left leg earlier today.  Patient is pleasantly confused.  She repeatedly states that she is hungry but otherwise denies any acute concerns.  Patient's husband reports that at baseline, patient is essentially bedbound.  They are occasionally able to get her up to the wheelchair but mostly patient requires maximum assistance with all ADLs including feeding.  Patient's husband says that he is in agreement with the current scope of medical treatment.  He would want to continue treating the treatable.  Husband says that patient is currently receiving home health through [facility] and also has services through [facility].  Despite that level of care, patient's husband reports feeling somewhat overwhelmed in the home and feels that patient would benefit from increased resources.  He would like for patient to continue receiving home health at time of discharge.  We did discuss the future involvement of hospice and he seems interested in that.  However, I would recommend that patient be first followed by palliative care at home as she continues to receive home health services.  We discussed advance care planning.  Patient's daughter is reportedly her financial POA.  Patient does not have a healthcare power of attorney nor does she have a living will.  We did discuss CODE STATUS.  Husband says that it is a conversation that he needs to have with his children and patient when she is better able to participate.    PLAN: -Continue current scope of treatment -Recommend home with home health and palliative care when she is medically ready -Full code    Time Total: 60 minutes  Visit consisted of counseling and  education dealing with the complex and emotionally intense issues of symptom management and palliative care in the setting of serious and potentially life-threatening illness.Greater than 50%  of this time was spent counseling and coordinating care related to the above assessment and plan.  Signed by: Altha Harm, PhD, NP-C

## 2019-06-08 NOTE — Progress Notes (Signed)
New referral for [facility] community Palliative program to follow at home received from [facility]. Patient information given to referral. Patient will also be followed by [facility]. Flo Shanks [facility], [place] [phone]

## 2019-06-08 NOTE — Progress Notes (Signed)
Patient back from specials with IV fluids infusing at 75 ml/hr.  No c/o pain and resting comfortably in bed with no bleeding at right groin dressing site.  IVF discontinued and verbal order from Dr. Sarajane Jews to restart prior diet.  Husband at bedside and updated on plan of care.  Clarise Cruz, BSN

## 2019-06-09 ENCOUNTER — Inpatient Hospital Stay: Payer: Medicare HMO | Admitting: Anesthesiology

## 2019-06-09 ENCOUNTER — Encounter
Admission: EM | Disposition: A | Discharge status: Home / Self Care | Payer: Self-pay | Source: Ambulatory Visit | Attending: Family Medicine

## 2019-06-09 ENCOUNTER — Encounter: Payer: Self-pay | Admitting: *Deleted

## 2019-06-09 DIAGNOSIS — E1159 Type 2 diabetes mellitus with other circulatory complications: Secondary | ICD-10-CM

## 2019-06-09 DIAGNOSIS — Z89422 Acquired absence of other left toe(s): Secondary | ICD-10-CM

## 2019-06-09 DIAGNOSIS — Z794 Long term (current) use of insulin: Secondary | ICD-10-CM

## 2019-06-09 DIAGNOSIS — B9620 Unspecified Escherichia coli [E. coli] as the cause of diseases classified elsewhere: Secondary | ICD-10-CM

## 2019-06-09 DIAGNOSIS — Z1612 Extended spectrum beta lactamase (ESBL) resistance: Secondary | ICD-10-CM

## 2019-06-09 DIAGNOSIS — I70203 Unspecified atherosclerosis of native arteries of extremities, bilateral legs: Secondary | ICD-10-CM

## 2019-06-09 DIAGNOSIS — Z89412 Acquired absence of left great toe: Secondary | ICD-10-CM

## 2019-06-09 HISTORY — PX: AMPUTATION TOE: SHX6595

## 2019-06-09 LAB — GLUCOSE, CAPILLARY
Glucose-Capillary: 154 mg/dL — ABNORMAL HIGH (ref 70–99)
Glucose-Capillary: 160 mg/dL — ABNORMAL HIGH (ref 70–99)
Glucose-Capillary: 183 mg/dL — ABNORMAL HIGH (ref 70–99)
Glucose-Capillary: 239 mg/dL — ABNORMAL HIGH (ref 70–99)
Glucose-Capillary: 249 mg/dL — ABNORMAL HIGH (ref 70–99)
Glucose-Capillary: 278 mg/dL — ABNORMAL HIGH (ref 70–99)

## 2019-06-09 LAB — APTT: aPTT: 30 seconds (ref 24–36)

## 2019-06-09 LAB — BASIC METABOLIC PANEL
Anion gap: 9 (ref 5–15)
BUN: 10 mg/dL (ref 8–23)
CO2: 26 mmol/L (ref 22–32)
Calcium: 9.1 mg/dL (ref 8.9–10.3)
Chloride: 103 mmol/L (ref 98–111)
Creatinine, Ser: 0.53 mg/dL (ref 0.44–1.00)
GFR calc Af Amer: >60 mL/min (ref >60)
GFR calc non Af Amer: >60 mL/min (ref >60)
Glucose, Bld: 197 mg/dL — ABNORMAL HIGH (ref 70–99)
Potassium: 3.7 mmol/L (ref 3.5–5.1)
Sodium: 138 mmol/L (ref 135–145)

## 2019-06-09 LAB — PROTIME-INR
INR: 1.1 (ref 0.8–1.2)
Prothrombin Time: 13.9 seconds (ref 11.4–15.2)

## 2019-06-09 LAB — CBC
HCT: 33.4 % — ABNORMAL LOW (ref 36.0–46.0)
Hemoglobin: 10.5 g/dL — ABNORMAL LOW (ref 12.0–15.0)
MCH: 25.7 pg — ABNORMAL LOW (ref 26.0–34.0)
MCHC: 31.4 g/dL (ref 30.0–36.0)
MCV: 81.7 fL (ref 80.0–100.0)
Platelets: 278 10*3/uL (ref 150–400)
RBC: 4.09 MIL/uL (ref 3.87–5.11)
RDW: 15.2 % (ref 11.5–15.5)
WBC: 11.8 10*3/uL — ABNORMAL HIGH (ref 4.0–10.5)
nRBC: 0 % (ref 0.0–0.2)

## 2019-06-09 LAB — TYPE AND SCREEN
ABO/RH(D): O POS
Antibody Screen: NEGATIVE

## 2019-06-09 LAB — MAGNESIUM: Magnesium: 2 mg/dL (ref 1.7–2.4)

## 2019-06-09 SURGERY — AMPUTATION, TOE
Anesthesia: General | Laterality: Left

## 2019-06-09 MED ORDER — CLINDAMYCIN PHOSPHATE 300 MG/50ML IV SOLN
300.0000 mg | Freq: Once | INTRAVENOUS | Status: AC
  Administered 2019-06-09: 300 mg via INTRAVENOUS
  Filled 2019-06-09: qty 50

## 2019-06-09 MED ORDER — ROCURONIUM BROMIDE 50 MG/5ML IV SOLN
INTRAVENOUS | Status: AC
  Filled 2019-06-09: qty 1

## 2019-06-09 MED ORDER — ONDANSETRON HCL 4 MG/2ML IJ SOLN
INTRAMUSCULAR | Status: DC | PRN
  Administered 2019-06-09: 4 mg via INTRAVENOUS

## 2019-06-09 MED ORDER — FENTANYL CITRATE (PF) 100 MCG/2ML IJ SOLN
INTRAMUSCULAR | Status: AC
  Filled 2019-06-09: qty 2

## 2019-06-09 MED ORDER — FENTANYL CITRATE (PF) 100 MCG/2ML IJ SOLN: 25.0000 ug | INTRAMUSCULAR | Status: DC | PRN

## 2019-06-09 MED ORDER — RISAQUAD PO CAPS
1.0000 | ORAL_CAPSULE | Freq: Every day | ORAL | Status: DC
  Administered 2019-06-10 – 2019-06-11 (×2): 1 via ORAL
  Filled 2019-06-09 (×2): qty 1

## 2019-06-09 MED ORDER — PHENYLEPHRINE HCL (PRESSORS) 10 MG/ML IV SOLN
INTRAVENOUS | Status: DC | PRN
  Administered 2019-06-09 (×5): 100 ug via INTRAVENOUS

## 2019-06-09 MED ORDER — DEXAMETHASONE SODIUM PHOSPHATE 10 MG/ML IJ SOLN
INTRAMUSCULAR | Status: DC | PRN
  Administered 2019-06-09: 5 mg via INTRAVENOUS

## 2019-06-09 MED ORDER — PROPOFOL 10 MG/ML IV BOLUS
INTRAVENOUS | Status: DC | PRN
  Administered 2019-06-09: 90 mg via INTRAVENOUS

## 2019-06-09 MED ORDER — ONDANSETRON HCL 4 MG/2ML IJ SOLN
INTRAMUSCULAR | Status: AC
  Filled 2019-06-09: qty 2

## 2019-06-09 MED ORDER — ONDANSETRON HCL 4 MG/2ML IJ SOLN
4.0000 mg | Freq: Once | INTRAMUSCULAR | Status: AC
  Administered 2019-06-09: 4 mg via INTRAVENOUS

## 2019-06-09 MED ORDER — SUGAMMADEX SODIUM 200 MG/2ML IV SOLN
INTRAVENOUS | Status: DC | PRN
  Administered 2019-06-09: 200 mg via INTRAVENOUS

## 2019-06-09 MED ORDER — ROCURONIUM BROMIDE 100 MG/10ML IV SOLN
INTRAVENOUS | Status: DC | PRN
  Administered 2019-06-09: 50 mg via INTRAVENOUS

## 2019-06-09 MED ORDER — LIDOCAINE HCL (PF) 2 % IJ SOLN
INTRAMUSCULAR | Status: AC
  Filled 2019-06-09: qty 10

## 2019-06-09 MED ORDER — LIDOCAINE HCL (CARDIAC) PF 100 MG/5ML IV SOSY
PREFILLED_SYRINGE | INTRAVENOUS | Status: DC | PRN
  Administered 2019-06-09: 100 mg via INTRAVENOUS

## 2019-06-09 MED ORDER — DEXAMETHASONE SODIUM PHOSPHATE 10 MG/ML IJ SOLN
INTRAMUSCULAR | Status: AC
  Filled 2019-06-09: qty 1

## 2019-06-09 MED ORDER — FENTANYL CITRATE (PF) 100 MCG/2ML IJ SOLN
INTRAMUSCULAR | Status: DC | PRN
  Administered 2019-06-09 (×2): 50 ug via INTRAVENOUS

## 2019-06-09 MED ORDER — CLINDAMYCIN PHOSPHATE 300 MG/50ML IV SOLN
300.0000 mg | Freq: Four times a day (QID) | INTRAVENOUS | Status: AC
  Administered 2019-06-10 (×2): 300 mg via INTRAVENOUS
  Filled 2019-06-09 (×4): qty 50

## 2019-06-09 MED ORDER — MORPHINE SULFATE (PF) 2 MG/ML IV SOLN
1.0000 mg | INTRAVENOUS | Status: DC | PRN
  Administered 2019-06-09 – 2019-06-11 (×3): 1 mg via INTRAVENOUS
  Filled 2019-06-09 (×3): qty 1

## 2019-06-09 MED ORDER — PROPOFOL 10 MG/ML IV BOLUS
INTRAVENOUS | Status: AC
  Filled 2019-06-09: qty 20

## 2019-06-09 MED ORDER — SUGAMMADEX SODIUM 200 MG/2ML IV SOLN
INTRAVENOUS | Status: AC
  Filled 2019-06-09: qty 2

## 2019-06-09 MED ORDER — KETOROLAC TROMETHAMINE 30 MG/ML IJ SOLN
INTRAMUSCULAR | Status: DC | PRN
  Administered 2019-06-09: 30 mg via INTRAVENOUS

## 2019-06-09 MED ORDER — GLYCOPYRROLATE 0.2 MG/ML IJ SOLN
INTRAMUSCULAR | Status: DC | PRN
  Administered 2019-06-09: 0.2 mg via INTRAVENOUS

## 2019-06-09 SURGICAL SUPPLY — 23 items
BNDG GAUZE 4.5X4.1 6PLY STRL (MISCELLANEOUS) ×2 IMPLANT
CANISTER SUCT 1200ML W/VALVE (MISCELLANEOUS) ×4 IMPLANT
DRSG GAUZE FLUFF 36X18 (GAUZE/BANDAGES/DRESSINGS) ×2 IMPLANT
ELECT REM PT RETURN 9FT ADLT (ELECTROSURGICAL) ×3
ELECTRODE REM PT RTRN 9FT ADLT (ELECTROSURGICAL) IMPLANT
GAUZE XEROFORM 5X9 LF (GAUZE/BANDAGES/DRESSINGS) ×2 IMPLANT
GLOVE BIO SURGEON STRL SZ7 (GLOVE) ×4 IMPLANT
GLOVE SURG SYN 8.0 (GLOVE) ×6 IMPLANT
GLOVE SURG SYN 8.0 PF PI (GLOVE) IMPLANT
GOWN STRL REUS W/ TWL LRG LVL3 (GOWN DISPOSABLE) IMPLANT
GOWN STRL REUS W/ TWL XL LVL3 (GOWN DISPOSABLE) IMPLANT
GOWN STRL REUS W/TWL LRG LVL3 (GOWN DISPOSABLE) ×2
GOWN STRL REUS W/TWL XL LVL3 (GOWN DISPOSABLE) ×2
HANDLE YANKAUER SUCT BULB TIP (MISCELLANEOUS) ×2 IMPLANT
KIT TURNOVER KIT A (KITS) ×2 IMPLANT
NS IRRIG 500ML POUR BTL (IV SOLUTION) ×2 IMPLANT
PACK EXTREMITY ARMC (MISCELLANEOUS) ×2 IMPLANT
STOCKINETTE STRL 6IN 960660 (GAUZE/BANDAGES/DRESSINGS) ×2 IMPLANT
SUT ETHILON 4-0 (SUTURE) ×4
SUT ETHILON 4-0 FS2 18XMFL BLK (SUTURE) ×2
SUT VIC AB 3-0 SH 27 (SUTURE) ×2
SUT VIC AB 3-0 SH 27X BRD (SUTURE) IMPLANT
SUTURE ETHLN 4-0 FS2 18XMF BLK (SUTURE) IMPLANT

## 2019-06-09 NOTE — Anesthesia Preprocedure Evaluation (Addendum)
Anesthesia Evaluation  Patient identified by MRN, date of birth, ID band Patient awake    Reviewed: Allergy & Precautions, H&P , NPO status , Patient's Chart, lab work & pertinent test results  History of Anesthesia Complications (+) history of anesthetic complications ("hard to wake up")  Airway Mallampati: III  TM Distance: >3 FB Neck ROM: full    Dental  (+) Poor Dentition, Chipped   Pulmonary shortness of breath,           Cardiovascular hypertension, + Peripheral Vascular Disease and +CHF  + Valvular Problems/Murmurs AS      Neuro/Psych CVA (cognitive deficits, oriented x 1, right sided arm pain/weakness, non-ambulatory), Residual Symptoms negative psych ROS   GI/Hepatic Neg liver ROS, GERD  Controlled,  Endo/Other  diabetes  Renal/GU      Musculoskeletal   Abdominal   Peds  Hematology  (+) Blood dyscrasia, anemia , Hgb 10.2   Anesthesia Other Findings Past Medical History: No date: Acute heart failure (HCC) No date: Aortic stenosis No date: CHF (congestive heart failure) (HCC) No date: Complication of anesthesia     Comment:  Hard to wake patient up after having anesthesia No date: Diabetes mellitus without complication (HCC) No date: Diabetic neuropathy (HCC)     Comment:  Takes Lyrica No date: Edema of both legs     Comment:  Takes Lasix No date: GERD (gastroesophageal reflux disease) No date: High cholesterol No date: HTN (hypertension) No date: Hypertension No date: PAD (peripheral artery disease) (HCC) No date: Shortness of breath dyspnea No date: Spasm of back muscles No date: Stroke [facility] No date: Wound, open     Comment:  Right great toe  Past Surgical History: 02/28/2016: BYPASS GRAFT POPLITEAL TO TIBIAL; Right     Comment:  Procedure: BYPASS GRAFT RIGHT BELOW KNEE POPLITEAL TO               PERONEAL USING REVERSED RIGHT GREATER SAPHENOUS VEIN;                Surgeon: Conrad Pen Argyl, MD;   Location: [place];  Service:               Vascular;  Laterality: Right; No date: CYST EXCISION     Comment:  Abdomen No date: EYE SURGERY; Bilateral     Comment:  Cataract removal 02/04/2014: INTRAMEDULLARY (IM) NAIL INTERTROCHANTERIC; Left     Comment:  Procedure: INTRAMEDULLARY (IM) NAIL INTERTROCHANTRIC               FEMORAL;  Surgeon: Mauri Pole, MD;  Location: [place];               Service: Orthopedics;  Laterality: Left; 03/01/2016: IR GENERIC HISTORICAL     Comment:  IR ANGIO INTRA EXTRACRAN SEL COM CAROTID INNOMINATE UNI               R MOD SED 03/01/2016 [name], MD [facility] 03/01/2016: IR GENERIC HISTORICAL     Comment:  IR ENDOVASC INTRACRANIAL INF OTHER THAN THROMBO ART INC               DIAG ANGIO 03/01/2016 [name], MD [facility] 03/01/2016: IR GENERIC HISTORICAL     Comment:  IR INTRAVSC STENT CERV CAROTID W/O EMB-PROT MOD SED INC               ANGIO 03/01/2016 [name], MD [facility] 06/03/2016: IR GENERIC HISTORICAL     Comment:  IR RADIOLOGIST  EVAL & MGMT 06/03/2016 [facility] 02/09/2019: LOWER EXTREMITY ANGIOGRAPHY; Left     Comment:  Procedure: LOWER EXTREMITY ANGIOGRAPHY;  Surgeon:               Katha Cabal, MD;  Location: [place] [facility];               Service: Cardiovascular;  Laterality: Left; 03/10/2019: LOWER EXTREMITY ANGIOGRAPHY; Left     Comment:  Procedure: LOWER EXTREMITY ANGIOGRAPHY;  Surgeon:               Katha Cabal, MD;  Location: [place] [facility];               Service: Cardiovascular;  Laterality: Left; 04/27/2019: LOWER EXTREMITY ANGIOGRAPHY; Left     Comment:  Procedure: LOWER EXTREMITY ANGIOGRAPHY;  Surgeon:               Katha Cabal, MD;  Location: [place] [facility];               Service: Cardiovascular;  Laterality: Left; 06/08/2019: LOWER EXTREMITY ANGIOGRAPHY; Left     Comment:  Procedure: Lower Extremity Angiography;  Surgeon:               Katha Cabal, MD;   Location: [place] [facility];               Service: Cardiovascular;  Laterality: Left; 02/08/2014: ORIF TOE FRACTURE; Right     Comment:  Procedure: OPEN REDUCTION INTERNAL FIXATION Right               METATARSAL  FRACTURE ;  Surgeon: Wylene Simmer, MD;                Location: [place];  Service: Orthopedics;  Laterality:               Right; 12/28/2015: PERIPHERAL VASCULAR CATHETERIZATION; N/A     Comment:  Procedure: Abdominal Aortogram w/Lower Extremity;                Surgeon: Conrad Borden, MD;  Location: [place] [facility];              Service: Cardiovascular;  Laterality: N/A; 03/01/2016: RADIOLOGY WITH ANESTHESIA; N/A     Comment:  Procedure: RADIOLOGY WITH ANESTHESIA;  Surgeon: [name], MD;  Location: [place];  Service: Radiology;                Laterality: N/A; 02/28/2016: VEIN HARVEST; Right     Comment:  Procedure: RIGHT GREATER SAPHENOUS VEIN HARVEST;                Surgeon: Conrad Eunice, MD;  [facility];  Service:               Vascular;  Laterality: Right;  BMI    Body Mass Index: 27.62 kg/m      Reproductive/Obstetrics negative OB ROS                            Anesthesia Physical Anesthesia Plan  ASA: III  Anesthesia Plan: General ETT   Post-op Pain Management:    Induction:   PONV Risk Score and Plan: Ondansetron, Dexamethasone, Midazolam and Treatment may vary due to age or medical condition  Airway Management Planned:   Additional Equipment:  Intra-op Plan:   Post-operative Plan:   Informed Consent: I have reviewed the patients History and Physical, chart, labs and discussed the procedure including the risks, benefits and alternatives for the proposed anesthesia with the patient or authorized representative who has indicated his/her understanding and acceptance.     Dental Advisory Given  Plan Discussed with: Anesthesiologist, CRNA and Surgeon  Anesthesia Plan Comments:         Anesthesia  Quick Evaluation

## 2019-06-09 NOTE — Transfer of Care (Signed)
Immediate Anesthesia Transfer of Care Note  Patient: [name]  Procedure(s) Performed: Left third toe and partial great toe amputation and debridement (Left )  Patient Location: PACU  Anesthesia Type:General  Level of Consciousness: awake  Airway & Oxygen Therapy: Patient Spontanous Breathing and Patient connected to face mask oxygen  Post-op Assessment: Report given to RN and Post -op Vital signs reviewed and stable  Post vital signs: Reviewed  Last Vitals:  Vitals Value Taken Time  BP 182/61 06/09/19 1256  Temp    Pulse 88 06/09/19 1256  Resp 10 06/09/19 1256  SpO2 97 % 06/09/19 1256    Last Pain:  Vitals:   06/09/19 1026  TempSrc: Temporal  PainSc: 0-No pain         Complications: No apparent anesthesia complications

## 2019-06-09 NOTE — Anesthesia Post-op Follow-up Note (Signed)
Anesthesia QCDR form completed.        

## 2019-06-09 NOTE — Anesthesia Procedure Notes (Signed)
Procedure Name: Intubation Date/Time: 06/09/2019 11:46 AM Performed by: Aline Brochure, CRNA Pre-anesthesia Checklist: Patient identified, Emergency Drugs available, Suction available and Patient being monitored Patient Re-evaluated:Patient Re-evaluated prior to induction Oxygen Delivery Method: Circle system utilized Preoxygenation: Pre-oxygenation with 100% oxygen Induction Type: IV induction Ventilation: Mask ventilation without difficulty Laryngoscope Size: McGraph and 3 Grade View: Grade I Tube type: Oral Tube size: 7.0 mm Number of attempts: 1 Airway Equipment and Method: Stylet and Video-laryngoscopy Placement Confirmation: ETT inserted through vocal cords under direct vision,  positive ETCO2 and breath sounds checked- equal and bilateral Secured at: 21 cm Tube secured with: Tape Dental Injury: Teeth and Oropharynx as per pre-operative assessment

## 2019-06-09 NOTE — Op Note (Signed)
    OPERATIVE NOTE   PROCEDURE: Interphalangeal amputation of the left great toe and left third toe  PRE-OPERATIVE DIAGNOSIS: Gangrene of the first and third toes left foot; atherosclerotic occlusive disease with ulceration  POST-OPERATIVE DIAGNOSIS: Same  SURGEON: Hortencia Pilar  ASSISTANT(S): None  ANESTHESIA: general  ESTIMATED BLOOD LOSS: 10 cc   FINDING(S): Purulent material noted in the necrotic area of the tip of the great toe on the left foot.  Exposed bone with purulence left third toe  SPECIMEN(S): 2 specimens the third toe separated from the debrided first toe  INDICATIONS:   [name] is a 78 y.o. female who presents with peripheral vascular disease who is status post intervention and now has a palpable dorsalis pedis pulse.  She has gangrenous changes to the first and third toes on the left.  Purulent material has been noted as well.  She is therefore undergoing debridement.  DESCRIPTION: After full informed written consent was obtained from the patient, the patient was brought back to the operating room and placed supine upon the operating table.  Prior to induction, the patient received IV antibiotics.   After obtaining adequate anesthesia, the patient was then prepped and draped in the standard fashion for a debridement with partial toe amputation left foot.  The third toe is noted to be gangrenous with necrotic material down to the phalangeal bone.  The proximal phalangeal was then transected with bone shears and a rongeur is used to debride it back to a more robust bone.  Tendons are debrided sharply with Mayo scissors.  The wound is then packed with a sponge.  Pus is noted to be coming out of the wound and the great toe.  Once this is debrided the distal phalangeal bone is exposed.  15 blade is then used to create a teardrop shaped shaped incision to excise the distal gangrenous soft tissues and expose the phalangeal bone.  The distal phalangeal bone is then  transected with a bone shears.  This creates a flap with the skin tissue of the dorsal surface of the great toe.  This area is then washed with sterile saline.  Hemostasis is obtained in both locations with Bovie cautery the subcutaneous tissues were then reapproximated with interrupted 3-0 Vicryl sutures.  The skin is then reapproximated at both locations with interrupted 4-0 nylon vertical mattress sutures.  The patient tolerated this procedure well.   The Xeroform was then placed across the incision followed by fluffed gauze and then a Kerlix.  COMPLICATIONS: None  CONDITION: [name]  Office: [phone]   06/09/2019, 12:46 PM

## 2019-06-09 NOTE — Progress Notes (Signed)
[facility] [place] NAME: [name]    MR#:  [id number]  DATE OF BIRTH:  01/17/1941  SUBJECTIVE:  patient sleepy this morning. No new issues per RN. Had uneventful night. Sugars stable. She is NPO for pending surgery. REVIEW OF SYSTEMS:   Review of Systems  Constitutional: Positive for malaise/fatigue. Negative for chills, fever and weight loss.  HENT: Negative for ear discharge, ear pain and nosebleeds.   Eyes: Negative for blurred vision, pain and discharge.  Respiratory: Negative for sputum production, shortness of breath, wheezing and stridor.   Cardiovascular: Negative for chest pain, palpitations, orthopnea and PND.  Gastrointestinal: Negative for abdominal pain, diarrhea, nausea and vomiting.  Genitourinary: Negative for frequency and urgency.  Musculoskeletal: Negative for back pain and joint pain.  Neurological: Positive for weakness. Negative for sensory change, speech change and focal weakness.  Psychiatric/Behavioral: Negative for depression and hallucinations. The patient is not nervous/anxious.    Tolerating Diet:npo today Tolerating PT: bed bound  DRUG ALLERGIES:   Allergies  Allergen Reactions   Iodine Anaphylaxis   Penicillins Anaphylaxis    Tolerates ceftriaxone, cefazolin Did it involve swelling of the face/tongue/throat, SOB, or low BP? Unknown Did it involve sudden or severe rash/hives, skin peeling, or any reaction on the inside of your mouth or nose? Unknown Did you need to seek medical attention at a hospital or doctor's office? Unknown When did it last happen?Unknown If all above answers are NO, may proceed with cephalosporin use.    Shellfish Allergy Anaphylaxis   Sulfa Antibiotics Anaphylaxis   Sulfacetamide Sodium Anaphylaxis   Sulfasalazine Anaphylaxis   Eggs Or Egg-Derived Products     Other reaction(s): SHORTNESS OF BREATH   Morphine And Related Other (See Comments)    Altered  mental status    VITALS:  Blood pressure 128/68, pulse 72, temperature 98.4 F (36.9 C), temperature source Oral, resp. rate 16, height 5\' 4"  (1.626 m), weight 73.9 kg, SpO2 99 %.  PHYSICAL EXAMINATION:   Physical Exam  GENERAL:  78 y.o.-year-old patient lying in the bed with no acute distress. obese EYES: Pupils equal, round, reactive to light and accommodation. No scleral icterus. Extraocular muscles intact.  HEENT: Head atraumatic, normocephalic. Oropharynx and nasopharynx clear.  NECK:  Supple, no jugular venous distention. No thyroid enlargement, no tenderness.  LUNGS: Normal breath sounds bilaterally, no wheezing, rales, rhonchi. No use of accessory muscles of respiration.  CARDIOVASCULAR: S1, S2 normal. No murmurs, rubs, or gallops.  ABDOMEN: Soft, nontender, nondistended. Bowel sounds present. No organomegaly or mass.  EXTREMITIES:         NEUROLOGIC: Cranial nerves II through XII are intact. No focal Motor or sensory deficits b/l.  generalized weakness PSYCHIATRIC:  patient is alert but sleepy SKIN:as above  LABORATORY PANEL:  CBC Recent Labs  Lab 06/09/19 0618  WBC 11.8*  HGB 10.5*  HCT 33.4*  PLT 278    Chemistries  Recent Labs  Lab 06/02/19 2149  06/09/19 0618  NA 139   < > 138  K 4.1   < > 3.7  CL 102   < > 103  CO2 23   < > 26  GLUCOSE 178*   < > 197*  BUN 12   < > 10  CREATININE 0.64   < > 0.53  CALCIUM 9.6   < > 9.1  MG  --    < > 2.0  AST 18  --   --   ALT  18  --   --   ALKPHOS 70  --   --   BILITOT 0.5  --   --    < > = values in this interval not displayed.   Cardiac Enzymes No results for input(s): TROPONINI in the last 168 hours. RADIOLOGY:  No results found. ASSESSMENT AND PLAN:  [name] is a 78 y.o. female with a history of diabetes mellitus, CHF, aortic stenosis, peripheral vascular disease, chronic changes in the foot and toes secondary to diabetes, history of treated MSSA infection of the right first metatarsal head ulcer  in the past, recent angiogram on 04/27/2019 and angioplasty on the right anterior tibial artery and right SFA done by Dr. Franchot Gallo.    Gangrenous toes bilateral lower extremities, atherosclerosis bilateral lower extremities.   -Plans were for left angiogram but were canceled temporarily given somnolence which resolved.   --Underwent recannulization left lower extremity for limb salvage 11/3. --Plan for amputation 11/4 per Dr Ronalee Belts  Asymptomatic bacteriuria, seen by infectious disease with recommendation not to use antibiotics unless symptomatic with fever. --No treatment indicated  Diabetes mellitus type 2 with diabetic neuropathy - Hemoglobin A1c 8.3. --Remains stable.   -Continue Lantus, sliding scale insulin -Neurontin.  Essential hypertension --Somewhat labile.  Continue amlodipine and carvedilol.  Chronic diastolic CHF --Appears stable.  Continue torsemide and carvedilol.   mild to moderate aortic stenosis  Seen by Palliative care--Full code with home palliative service  Case discussed with Care Management/Social Worker.  CODE STATUS: Full code DVT Prophylaxis: lovenox (after surgery)  TOTAL TIME TAKING CARE OF THIS PATIENT: *30* minutes.  >50% time spent on counselling and coordination of care  POSSIBLE D/C IN *2-3* DAYS, DEPENDING ON CLINICAL CONDITION.  Note: This dictation was prepared with Dragon dictation along with smaller phrase technology. Any transcriptional errors that result from this process are unintentional.  Fritzi Mandes M.D on 06/09/2019 at 8:33 AM  Between 7am to 6pm - Pager - [phone]  After 6pm go to [email or web address] - password [email or web address] [facility]   CC: Primary care physician; [name], MDPatient ID: [name], female   DOB: 02/05/1941, 78 y.o.   MRN: [id number]

## 2019-06-09 NOTE — Progress Notes (Addendum)
   Date of Admission:  06/02/2019       Subjective: Patient had surgery today. Underwent amputation of the left great toe and the third toe. Purulence seen. Patient is thirsty. No fever No pain  Medications:  . amLODipine  10 mg Oral Daily  . aspirin EC  81 mg Oral Daily  . atorvastatin  20 mg Oral QHS  . carvedilol  6.25 mg Oral BID WC  . Chlorhexidine Gluconate Cloth  6 each Topical Daily  . cholecalciferol  2,000 Units Oral Daily  . clopidogrel  75 mg Oral Q breakfast  . docusate sodium  100 mg Oral BID  . gabapentin  300 mg Oral QPM  . hydrALAZINE  25 mg Oral Q8H  . insulin aspart  0-5 Units Subcutaneous QHS  . insulin aspart  0-9 Units Subcutaneous TID WC  . insulin glargine  15 Units Subcutaneous QHS  . isosorbide dinitrate  30 mg Oral Daily  . loratadine  10 mg Oral Daily  . magnesium oxide  400 mg Oral BID  . modafinil  200 mg Oral Daily  . ondansetron      . pantoprazole  40 mg Oral Daily  . potassium chloride SA  20 mEq Oral Daily  . pregabalin  50 mg Oral BID  . sodium chloride flush  10-40 mL Intracatheter Q12H  . sodium chloride flush  3 mL Intravenous Q12H  . torsemide  20 mg Oral BID  . vitamin E  400 Units Oral Daily    Objective: Vital signs in last 24 hours: Temp:  [97.3 F (36.3 C)-98.4 F (36.9 C)] 97.3 F (36.3 C) (11/04 1404) Pulse Rate:  [66-88] 69 (11/04 1404) Resp:  [10-18] 17 (11/04 1404) BP: (128-188)/(54-83) 160/68 (11/04 1404) SpO2:  [95 %-100 %] 95 % (11/04 1404) Weight:  [73 kg] 73 kg (11/04 1026)  PHYSICAL EXAM:  General: Awake in no distress Lungs: Bilateral air entry Heart: S1-S2 Abdomen: Soft, non-tender,not distended. Bowel sounds normal. No masses Extremities: Left foot surgical dressing not removed right foot first great toe has discoloration.  With a scab  lymph: Cervical, supraclavicular normal. Neurologic: Grossly non-focal  Lab Results Recent Labs    06/08/19 0538 06/09/19 0618  WBC  --  11.8*  HGB  --   10.5*  HCT  --  33.4*  NA 139 138  K 4.0 3.7  CL 103 103  CO2 29 26  BUN 7* 10  CREATININE 0.61 0.53    Assessment/Plan:  Peripheral arterial disease with chronic ischemic wounds on the toes.  Today she underwent amputation of the first and the third toe of the left foot.  Some purulence noted at surgery.  Discussed with Dr. Franchot Gallo.  As the infected tissue and bone has been removed he recommends 3 doses of clindamycin postop.  Patient has penicillin allergy.  Asymptomatic bacteriuria with ESBL E. coli.  Colonized with it.  Post void bladder scan showed no residual urine.  No treatment is needed. Discussed with the husband that urine should not be checked routinely unless she has a fever or any clinical instability.   Diabetes mellitus on insulin.  Hypertension on amlodipine, carvedilol and hydralazine.  Patient is on modafinil  Discussed the management with the surgical team and her husband.  ID will sign off.  Call if needed.

## 2019-06-09 NOTE — H&P (Signed)
[facility] History & Physical Update  The patient was interviewed and re-examined.  The patient's previous History and Physical has been reviewed and is unchanged.  There is no change in the plan of care. We plan to proceed with the scheduled procedure.  Hortencia Pilar, MD  06/09/2019, 11:16 AM

## 2019-06-10 ENCOUNTER — Other Ambulatory Visit: Payer: Medicare HMO

## 2019-06-10 ENCOUNTER — Encounter: Payer: Self-pay | Admitting: Vascular Surgery

## 2019-06-10 LAB — SURGICAL PATHOLOGY

## 2019-06-10 LAB — GLUCOSE, CAPILLARY
Glucose-Capillary: 138 mg/dL — ABNORMAL HIGH (ref 70–99)
Glucose-Capillary: 96 mg/dL (ref 70–99)
Glucose-Capillary: 133 mg/dL — ABNORMAL HIGH (ref 70–99)
Glucose-Capillary: 194 mg/dL — ABNORMAL HIGH (ref 70–99)

## 2019-06-10 MED ORDER — ENOXAPARIN SODIUM 40 MG/0.4ML ~~LOC~~ SOLN
40.0000 mg | SUBCUTANEOUS | Status: DC
  Administered 2019-06-10 – 2019-06-11 (×2): 40 mg via SUBCUTANEOUS
  Filled 2019-06-10 (×2): qty 0.4

## 2019-06-10 NOTE — Progress Notes (Signed)
[facility] Daily Progress Note   Subjective: 06/10/19: Interphalangeal amputation of the left great toe and left third toe  06/08/19: 1. Introduction catheter into left lower extremity 3rd order catheter placement  2. Contrast injection left lower extremity for distal runoff  3. Percutaneous transluminal angioplasty left superficial femoral and popliteal arteries to 4 mm with Lutonix drug-eluting balloon 4. Percutaneous transluminal angioplasty left anterior tibial to 3 mm with an ultra score balloon  5. Star close closure right common femoral arteriotomy  Patient without complaint this AM. No issues overnight.   Objective: Vitals:   06/09/19 1959 06/09/19 2342 06/10/19 0417 06/10/19 0937  BP: (!) 153/53 (!) 145/60 (!) 133/50 (!) 175/66  Pulse: 71 66 65 64  Resp: 17 19 17 20   Temp: (!) 97.4 F (36.3 C) 97.9 F (36.6 C) 97.7 F (36.5 C) 97.7 F (36.5 C)  TempSrc: Oral Oral Oral Oral  SpO2: 98% 98% 98% 96%  Weight:      Height:        Intake/Output Summary (Last 24 hours) at 06/10/2019 1117 Last data filed at 06/09/2019 1500 Gross per 24 hour  Intake 670 ml  Output -  Net 670 ml   Physical Exam: A&Ox3, NAD CV: RRR Pulmonary: CTA Bilaterally Abdomen: Soft, Nontender, Nondistended Vascular:  Left Lower Extremity: Thigh soft.  Calf soft.  Extremities warm distally.  Over dressing intact with some drainage.   Laboratory: CBC    Component Value Date/Time   WBC 11.8 (H) 06/09/2019 0618   HGB 10.5 (L) 06/09/2019 0618   HCT 33.4 (L) 06/09/2019 0618   PLT 278 06/09/2019 0618   BMET    Component Value Date/Time   NA 138 06/09/2019 0618   K 3.7 06/09/2019 0618   CL 103 06/09/2019 0618   CO2 26 06/09/2019 0618   GLUCOSE 197 (H) 06/09/2019 0618   BUN 10 06/09/2019 0618   CREATININE 0.53 06/09/2019 0618   CALCIUM 9.1 06/09/2019 0618   GFRNONAA >60 06/09/2019 0618   GFRAA >60  06/09/2019 0618   Assessment/Planning: The patient is a 78 year old female with multiple medical issues including atherosclerotic disease with gangrene.  The patient is now s/p interphalangeal amputation of the left great toe and left third toe - POD#1 1) plan to change in OR dressing tomorrow 2) agree with discharge home after dressing change tomorrow  Discussed with Dr. Eber Hong Yuritzi Kamp PA-C 06/10/2019 11:17 AM

## 2019-06-10 NOTE — Progress Notes (Signed)
[facility] NAME: [name]    MR#:  [id number]  DATE OF BIRTH:  1941/07/10  SUBJECTIVE:  postop day one. Overall doing well. Waiting for her husband to come for her to eat breakfast. Sugars better denies any chest pain or shortness of breath REVIEW OF SYSTEMS:   Review of Systems  Constitutional: Positive for malaise/fatigue. Negative for chills, fever and weight loss.  HENT: Negative for ear discharge, ear pain and nosebleeds.   Eyes: Negative for blurred vision, pain and discharge.  Respiratory: Negative for sputum production, shortness of breath, wheezing and stridor.   Cardiovascular: Negative for chest pain, palpitations, orthopnea and PND.  Gastrointestinal: Negative for abdominal pain, diarrhea, nausea and vomiting.  Genitourinary: Negative for frequency and urgency.  Musculoskeletal: Negative for back pain and joint pain.  Neurological: Positive for weakness. Negative for sensory change, speech change and focal weakness.  Psychiatric/Behavioral: Negative for depression and hallucinations. The patient is not nervous/anxious.    Tolerating Diet yes Tolerating PT: bed bound  DRUG ALLERGIES:   Allergies  Allergen Reactions  . Iodine Anaphylaxis  . Penicillins Anaphylaxis    Tolerates ceftriaxone, cefazolin Did it involve swelling of the face/tongue/throat, SOB, or low BP? Unknown Did it involve sudden or severe rash/hives, skin peeling, or any reaction on the inside of your mouth or nose? Unknown Did you need to seek medical attention at a hospital or doctor's office? Unknown When did it last happen?Unknown If all above answers are "NO", may proceed with cephalosporin use.   . Shellfish Allergy Anaphylaxis  . Sulfa Antibiotics Anaphylaxis  . Sulfacetamide Sodium Anaphylaxis  . Sulfasalazine Anaphylaxis  . Eggs Or Egg-Derived Products     Other reaction(s): SHORTNESS OF BREATH  . Morphine And Related Other  (See Comments)    Altered mental status    VITALS:  Blood pressure (!) 133/50, pulse 65, temperature 97.7 F (36.5 C), temperature source Oral, resp. rate 17, height 5\' 4"  (1.626 m), weight 73 kg, SpO2 98 %.  PHYSICAL EXAMINATION:   Physical Exam  GENERAL:  78 y.o.-year-old patient lying in the bed with no acute distress. obese EYES: Pupils equal, round, reactive to light and accommodation. No scleral icterus. Extraocular muscles intact.  HEENT: Head atraumatic, normocephalic. Oropharynx and nasopharynx clear.  NECK:  Supple, no jugular venous distention. No thyroid enlargement, no tenderness.  LUNGS: Normal breath sounds bilaterally, no wheezing, rales, rhonchi. No use of accessory muscles of respiration.  CARDIOVASCULAR: S1, S2 normal. No murmurs, rubs, or gallops.  ABDOMEN: Soft, nontender, nondistended. Bowel sounds present. No organomegaly or mass.  EXTREMITIES:     Has post surgical dressing+      NEUROLOGIC: Cranial nerves II through XII are intact. No focal Motordeficits b/l.  generalized weakness PSYCHIATRIC:  patient is alert and awake SKIN:as above  LABORATORY PANEL:  CBC Recent Labs  Lab 06/09/19 0618  WBC 11.8*  HGB 10.5*  HCT 33.4*  PLT 278    Chemistries  Recent Labs  Lab 06/09/19 0618  NA 138  K 3.7  CL 103  CO2 26  GLUCOSE 197*  BUN 10  CREATININE 0.53  CALCIUM 9.1  MG 2.0   Cardiac Enzymes No results for input(s): TROPONINI in the last 168 hours. RADIOLOGY:  No results found. ASSESSMENT AND PLAN:  [name] is a 78 y.o. female with a history of diabetes mellitus, CHF, aortic stenosis, peripheral vascular disease, chronic changes in the foot and toes secondary  to diabetes, history of treated MSSA infection of the right first metatarsal head ulcer in the past, recent angiogram on 04/27/2019 and angioplasty on the right anterior tibial artery and right SFA done by Dr. Franchot Gallo.    Gangrenous toes bilateral lower extremities,  atherosclerosis bilateral lower extremities.   --Underwent recannulization left lower extremity for limb salvage 11/3. --s/p amputation of left 3rd toe and debdridement of left great11/4 per Dr Ronalee Belts -received three doses of IV clindamycin. Does not need any more antibiotics.  Asymptomatic bacteriuria, seen by infectious disease with recommendation not to use antibiotics unless symptomatic with fever. --No treatment indicated  Diabetes mellitus type 2 with diabetic neuropathy - Hemoglobin A1c 8.3. --Remains stable.   -Continue Lantus, sliding scale insulin -Neurontin.  Essential hypertension --Somewhat labile.  Continue amlodipine and carvedilol.  Chronic diastolic CHF --Appears stable.  Continue torsemide and carvedilol.   mild to moderate aortic stenosis  Seen by Palliative care--Full code with home palliative service. Patient overall doing better. Anticipate discharged tomorrow if okay with vascular surgery.  Case management for discharge planning. Discussed with daughter [name] on the phone and updated. She agrees with the plan.  Case discussed with Care Management/Social Worker.  CODE STATUS: Full code DVT Prophylaxis: lovenox (after surgery)  TOTAL TIME TAKING CARE OF THIS PATIENT: *30* minutes.  >50% time spent on counselling and coordination of care  POSSIBLE D/C IN *2-3* DAYS, DEPENDING ON CLINICAL CONDITION.  Note: This dictation was prepared with Dragon dictation along with smaller phrase technology. Any transcriptional errors that result from this process are unintentional.  Fritzi Mandes M.D on 06/10/2019 at 8:51 AM  Between 7am to 6pm - Pager - [phone]  After 6pm go to [email or web address] - password [email or web address] [facility]   CC: Primary care physician; [name], MDPatient ID: [name], female   DOB: 1940-11-02, 78 y.o.   MRN: [id number]

## 2019-06-10 NOTE — Care Management Important Message (Signed)
Important Message  Patient Details  Name: [name] MRN: [id number] Date of Birth: December 01, 1940   Medicare Important Message Given:  Yes     Shelbie Hutching, RN 06/10/2019, 4:12 PM

## 2019-06-10 NOTE — Anesthesia Postprocedure Evaluation (Signed)
Anesthesia Post Note  Patient: [name]  Procedure(s) Performed: Left third toe and partial great toe amputation and debridement (Left )  Patient location during evaluation: PACU Anesthesia Type: General Level of consciousness: awake and alert Pain management: pain level controlled Vital Signs Assessment: post-procedure vital signs reviewed and stable Respiratory status: spontaneous breathing, nonlabored ventilation and respiratory function stable Cardiovascular status: blood pressure returned to baseline and stable Postop Assessment: no apparent nausea or vomiting Anesthetic complications: no     Last Vitals:  Vitals:   06/10/19 0937 06/10/19 1646  BP: (!) 175/66 (!) 147/60  Pulse: 64 71  Resp: 20 17  Temp: 36.5 C 36.7 C  SpO2: 96% 97%    Last Pain:  Vitals:   06/10/19 1646  TempSrc: Oral  PainSc:                  Durenda Hurt

## 2019-06-10 NOTE — TOC Progression Note (Signed)
Transition of Care [facility]) - Progression Note    Patient Details  Name: [name] MRN: [id number] Date of Birth: [birth year]  [facility]) CM/SW Contact  Shelbie Hutching, RN Phone Number: 06/10/2019, 4:12 PM  Clinical Narrative:    Plan will be for patient to discharge home tomorrow after vascular surgery changes wound dressing to foot.  Patient will discharge to home, will need EMS transport.  Patient has all needed equipment at home, patient will be followed by [facility].    Expected Discharge Plan: [place] Barriers to Discharge: Continued Medical Work up  Expected Discharge Plan and Services Expected Discharge Plan: [place]   Discharge Planning Services: CM Consult Post Acute Care Choice: Resumption of Svcs/PTA Provider Living arrangements for the past 2 months: Single Family Home                                       Social Determinants of Health (SDOH) Interventions    Readmission Risk Interventions Readmission Risk Prevention Plan 12/24/2018  [place] or Home Care Consult Complete  Some recent data might be hidden

## 2019-06-11 ENCOUNTER — Other Ambulatory Visit: Payer: Medicare HMO

## 2019-06-11 LAB — GLUCOSE, CAPILLARY
Glucose-Capillary: 120 mg/dL — ABNORMAL HIGH (ref 70–99)
Glucose-Capillary: 180 mg/dL — ABNORMAL HIGH (ref 70–99)

## 2019-06-11 MED ORDER — TRAMADOL HCL 50 MG PO TABS: 50.0000 mg | ORAL_TABLET | Freq: Four times a day (QID) | ORAL | 0 refills | Status: DC | PRN

## 2019-06-11 MED ORDER — TRAMADOL HCL 50 MG PO TABS
50.0000 mg | ORAL_TABLET | Freq: Once | ORAL | Status: AC
  Administered 2019-06-11: 50 mg via ORAL

## 2019-06-11 MED ORDER — TRAMADOL HCL 50 MG PO TABS
50.0000 mg | ORAL_TABLET | Freq: Four times a day (QID) | ORAL | Status: DC | PRN
  Filled 2019-06-11: qty 1

## 2019-06-11 NOTE — Progress Notes (Signed)
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 Introduction catheter intoleftlower extremity 3rd order catheter placement  2.Contrast injectionleftlower extremity for distal runoff  3. Percutaneous transluminal angioplastyleftsuperficial femoral and popliteal arteries to 4 mm with Lutonix drug-eluting balloon 4. Percutaneous transluminal angioplastyleft anterior tibial to 3 mm with an ultra score balloon  5. Star close closure rightcommon femoral arteriotomy  Subjective: Patient without complaint. No issues overnight.  Objective: Vitals:   06/10/19 0937 06/10/19 1646 06/10/19 2025 06/11/19 0458  BP: (!) 175/66 (!) 147/60 (!) 140/56 (!) 137/50  Pulse: 64 71 72 65  Resp: 20 17 16 18   Temp: 97.7 F (36.5 C) 98.1 F (36.7 C) (!) 97.5 F (36.4 C) 98.3 F (36.8 C)  TempSrc: Oral Oral Oral Oral  SpO2: 96% 97% 100% 96%  Weight:      Height:        Intake/Output Summary (Last 24 hours) at 06/11/2019 1236 Last data filed at 06/11/2019 0617 Gross per 24 hour  Intake 2376.57 ml  Output 1700 ml  Net 676.57 ml   Physical Exam: A&Ox3, NAD CV: RRR Pulmonary: CTA Bilaterally Abdomen: Soft, Nontender, Nondistended Vascular:             Left Lower Extremity: Thigh soft.  Calf soft.  Extremities warm distally. Dressing with some dried blood. OR dressing removed. First toe / 3rd toe amputation site intact, clean and dry. Foot warm. New dressing applied.      Document Information Photos    06/11/2019 10:26  Attached To:  Hospital Encounter on 06/02/19  Source Information Sela Hua, PA-C  [facility] (1c)   Laboratory: CBC    Component Value Date/Time   WBC 11.8 (H) 06/09/2019 0618   HGB 10.5 (L) 06/09/2019 0618   HCT 33.4 (L) 06/09/2019 0618   PLT 278 06/09/2019 0618   BMET    Component  Value Date/Time   NA 138 06/09/2019 0618   K 3.7 06/09/2019 0618   CL 103 06/09/2019 0618   CO2 26 06/09/2019 0618   GLUCOSE 197 (H) 06/09/2019 0618   BUN 10 06/09/2019 0618   CREATININE 0.53 06/09/2019 0618   CALCIUM 9.1 06/09/2019 0618   GFRNONAA >60 06/09/2019 0618   GFRAA >60 06/09/2019 0618   Assessment/Planning: The patient is a 78 year old female with multiple medical issues including atherosclerotic disease with gangrene.  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 Eber Hong  PA-C 06/11/2019 12:36 PM

## 2019-06-11 NOTE — TOC Transition Note (Signed)
Transition of Care [facility]) - CM/SW Discharge Note   Patient Details  Name: [name] MRN: [id number] Date of Birth: [birth year]  [facility] CM/SW Contact:  Shelbie Hutching, RN Phone Number: 06/11/2019, 10:54 AM   Clinical Narrative:    Patient is medically stable for discharge.  Patient will discharge home with home health services through [place].  Patient will transport home via [facility].     Final next level of care: Home w Home Health Services Barriers to Discharge: No Barriers Identified   Patient Goals and CMS Choice   CMS Medicare.gov Compare Post Acute Care list provided to:: Patient Choice offered to / list presented to : Patient  Discharge Placement                       Discharge Plan and Services   Discharge Planning Services: CM Consult Post Acute Care Choice: Resumption of Svcs/PTA Provider                    HH Arranged: RN, PT, Nurse's Aide, Social Work [employer] Agency: [place] ([place]) Date [place]: 06/11/19 Time [place]: 1054 Representative spoke with at [place]: [place] Determinants of Health (SDOH) Interventions     Readmission Risk Interventions Readmission Risk Prevention Plan 12/24/2018  [place] or Home Care Consult Complete  Some recent data might be hidden

## 2019-06-11 NOTE — Progress Notes (Signed)
EMS called for transport to home address. Yisel Megill S, RN  

## 2019-06-11 NOTE — Discharge Summary (Addendum)
[facility] NAME: [name]    MR#:  [id number]  DATE OF BIRTH:  Dec 18, 1940  DATE OF ADMISSION:  06/02/2019 ADMITTING PHYSICIAN: [name], MD  DATE OF DISCHARGE: 06/11/2019  PRIMARY CARE PHYSICIAN: [name], MD    ADMISSION DIAGNOSIS:  Urinary tract infection without hematuria, site unspecified [N39.0]  DISCHARGE DIAGNOSIS:  Bilateral Severe PAD with Left great toe debridement and left 3rd toe Amputation  SECONDARY DIAGNOSIS:   Past Medical History:  Diagnosis Date  . Acute heart failure ([place])   . Aortic stenosis   . CHF (congestive heart failure) ([place])   . Complication of anesthesia    Hard to wake patient up after having anesthesia  . Diabetes mellitus without complication ([place])   . Diabetic neuropathy ([facility])    Takes Lyrica  . Edema of both legs    Takes Lasix  . GERD (gastroesophageal reflux disease)   . High cholesterol   . HTN (hypertension)   . Hypertension   . PAD (peripheral artery disease) ([place])   . Shortness of breath dyspnea   . Spasm of back muscles   . Stroke ([place])   . Wound, open    Right great toe    HOSPITAL COURSE:   [name] a 78 y.o.femalewith a history ofdiabetes mellitus, CHF, aortic stenosis, peripheral vascular disease, chronic changes in the foot and toes secondary to diabetes, history of treated MSSA infection of the right first metatarsal head ulcer in the past,recent angiogram on 04/27/2019 and angioplasty on the right anterior tibial artery and right SFA done by Dr. Franchot Gallo.   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 --s/p amputation of left 3rd toe and debdridement of left great11/4 per Dr Ronalee Belts -received three doses of IV clindamycin. Does not need any more antibiotics. -Seen changes as per instruction by vascular. Patient will have close  follow-up with vascular surgery as outpatient.  Asymptomatic bacteriuria, seen by infectious disease with recommendation not to use antibiotics unless symptomatic with fever. --No treatment indicated  Diabetes mellitus type 2 with diabetic neuropathy -Hemoglobin A1c 8.3. --Remains stable.  -Continue Lantus, sliding scale insulin -Neurontin.  Essential hypertension --Somewhat labile. Continue amlodipine and carvedilol.  Chronic diastolic CHF --Appears stable. Continue torsemide and carvedilol.   mild to moderate aortic stenosis  Seen by Palliative care--Full code with home palliative service. Patient overall doing better.  Patient will discharged to home after dressing change by vascular.  This was discussed with his daughter [name] yesterday. Will set up home health  Nursing. CONSULTS OBTAINED:  Treatment Team:  Algernon Huxley, MD Tsosie Billing, MD  DRUG ALLERGIES:   Allergies  Allergen Reactions  . Iodine Anaphylaxis  . Penicillins Anaphylaxis    Tolerates ceftriaxone, cefazolin Did it involve swelling of the face/tongue/throat, SOB, or low BP? Unknown Did it involve sudden or severe rash/hives, skin peeling, or any reaction on the inside of your mouth or nose? Unknown Did you need to seek medical attention at a hospital or doctor's office? Unknown When did it last happen?Unknown If all above answers are "NO", may proceed with cephalosporin use.   . Shellfish Allergy Anaphylaxis  . Sulfa Antibiotics Anaphylaxis  . Sulfacetamide Sodium Anaphylaxis  . Sulfasalazine Anaphylaxis  . Eggs Or Egg-Derived Products     Other reaction(s): SHORTNESS OF BREATH  . Morphine And Related Other (See Comments)    Altered  mental status    DISCHARGE MEDICATIONS:   Allergies as of 06/11/2019      Reactions   Iodine Anaphylaxis   Penicillins Anaphylaxis   Tolerates ceftriaxone, cefazolin Did it involve swelling of the face/tongue/throat, SOB, or low BP?  Unknown Did it involve sudden or severe rash/hives, skin peeling, or any reaction on the inside of your mouth or nose? Unknown Did you need to seek medical attention at a hospital or doctor's office? Unknown When did it last happen?Unknown If all above answers are "NO", may proceed with cephalosporin use.   Shellfish Allergy Anaphylaxis   Sulfa Antibiotics Anaphylaxis   Sulfacetamide Sodium Anaphylaxis   Sulfasalazine Anaphylaxis   Eggs Or Egg-derived Products    Other reaction(s): SHORTNESS OF BREATH   Morphine And Related Other (See Comments)   Altered mental status      Medication List    TAKE these medications   acetaminophen 500 MG tablet Commonly known as: TYLENOL Take 1,000 mg by mouth every 6 (six) hours as needed for mild pain.   albuterol 108 (90 Base) MCG/ACT inhaler Commonly known as: VENTOLIN HFA Inhale 2 puffs into the lungs every 6 (six) hours as needed for wheezing or shortness of breath.   amLODipine 10 MG tablet Commonly known as: NORVASC Take 10 mg by mouth daily.   aspirin 81 MG EC tablet Take 1 tablet (81 mg total) by mouth daily.   atorvastatin 20 MG tablet Commonly known as: LIPITOR Take 1 tablet (20 mg total) by mouth daily. What changed: when to take this   budesonide 0.25 MG/2ML nebulizer solution Commonly known as: PULMICORT Take 2 mLs (0.25 mg total) by nebulization 2 (two) times daily.   carvedilol 6.25 MG tablet Commonly known as: COREG Take 6.25 mg by mouth 2 (two) times daily with a meal.   cetirizine 10 MG tablet Commonly known as: ZYRTEC Take 10 mg by mouth daily.   clopidogrel 75 MG tablet Commonly known as: PLAVIX Take 1 tablet (75 mg total) by mouth daily with breakfast.   docusate sodium 100 MG capsule Commonly known as: COLACE Take 100 mg by mouth 2 (two) times daily.   fluticasone 50 MCG/ACT nasal spray Commonly known as: FLONASE Place 1 spray into both nostrils daily as needed for allergies.   gabapentin 300  MG capsule Commonly known as: NEURONTIN Take 300 mg by mouth every evening.   hydrALAZINE 25 MG tablet Commonly known as: APRESOLINE Take 1 tablet (25 mg total) by mouth every 8 (eight) hours.   insulin glargine 100 UNIT/ML injection Commonly known as: LANTUS Inject 0.15 mLs (15 Units total) into the skin at bedtime.   ipratropium-albuterol 0.5-2.5 (3) MG/3ML Soln Commonly known as: DUONEB Take 3 mLs by nebulization 2 (two) times daily.   isosorbide dinitrate 30 MG tablet Commonly known as: ISORDIL Take 30 mg by mouth daily.   magnesium oxide 400 MG tablet Commonly known as: MAG-OX Take 400 mg by mouth 2 (two) times daily.   modafinil 200 MG tablet Commonly known as: PROVIGIL Take 200 mg by mouth daily.   nitroGLYCERIN 0.4 MG SL tablet Commonly known as: NITROSTAT Place 1 tablet (0.4 mg total) under the tongue every 5 (five) minutes as needed for chest pain.   omeprazole 20 MG capsule Commonly known as: PRILOSEC Take 1 capsule (20 mg total) by mouth daily.   OXYGEN Inhale 2 L into the lungs daily as needed (short of breath).   polyethylene glycol 17 g packet Commonly known as: MIRALAX /  GLYCOLAX Take 17 g by mouth 2 (two) times daily. What changed:   when to take this  reasons to take this   potassium chloride SA 20 MEQ tablet Commonly known as: KLOR-CON Take 20 mEq by mouth daily.   pregabalin 50 MG capsule Commonly known as: LYRICA Take 50 mg by mouth 2 (two) times daily.   torsemide 20 MG tablet Commonly known as: DEMADEX Take 1 tablet (20 mg total) by mouth 2 (two) times daily.   traMADol 50 MG tablet Commonly known as: ULTRAM Take 1 tablet (50 mg total) by mouth every 6 (six) hours as needed for severe pain.   Vitamin D 50 MCG (2000 UT) Caps Take 2,000 Units by mouth daily.   Vitamin E 180 MG Caps Take 180 mg by mouth daily.       If you experience worsening of your admission symptoms, develop shortness of breath, life threatening  emergency, suicidal or homicidal thoughts you must seek medical attention immediately by calling 911 or calling your MD immediately  if symptoms less severe.  You Must read complete instructions/literature along with all the possible adverse reactions/side effects for all the Medicines you take and that have been prescribed to you. Take any new Medicines after you have completely understood and accept all the possible adverse reactions/side effects.   Please note  You were cared for by a hospitalist during your hospital stay. If you have any questions about your discharge medications or the care you received while you were in the hospital after you are discharged, you can call the unit and asked to speak with the hospitalist on call if the hospitalist that took care of you is not available. Once you are discharged, your primary care physician will handle any further medical issues. Please note that NO REFILLS for any discharge medications will be authorized once you are discharged, as it is imperative that you return to your primary care physician (or establish a relationship with a primary care physician if you do not have one) for your aftercare needs so that they can reassess your need for medications and monitor your lab values. Today   SUBJECTIVE   No new complaints. Overall did well yesterday. No new issues per RN VITAL SIGNS:  Blood pressure (!) 137/50, pulse 65, temperature 98.3 F (36.8 C), temperature source Oral, resp. rate 18, height 5\' 4"  (1.626 m), weight 73 kg, SpO2 96 %.  I/O:    Intake/Output Summary (Last 24 hours) at 06/11/2019 1216 Last data filed at 06/11/2019 0617 Gross per 24 hour  Intake 2376.57 ml  Output 1700 ml  Net 676.57 ml    PHYSICAL EXAMINATION:  GENERAL:  78 y.o.-year-old patient lying in the bed with no acute distress. Chronically ill EYES: Pupils equal, round, reactive to light and accommodation. No scleral icterus. Extraocular muscles intact.  HEENT:  Head atraumatic, normocephalic. Oropharynx and nasopharynx clear.  NECK:  Supple, no jugular venous distention. No thyroid enlargement, no tenderness.  LUNGS: Normal breath sounds bilaterally, no wheezing, rales,rhonchi or crepitation. No use of accessory muscles of respiration.  CARDIOVASCULAR: S1, S2 normal. No murmurs, rubs, or gallops.  ABDOMEN: Soft, non-tender, non-distended. Bowel sounds present. No organomegaly or mass.  EXTREMITIES: preoperatively left   foot exam   NEUROLOGIC: Cranial nerves II through XII are intact. Diabetic neuropathy PSYCHIATRIC: The patient is alert and oriented x 3.    DATA REVIEW:   CBC  Recent Labs  Lab 06/09/19 0618  WBC 11.8*  HGB 10.5*  HCT 33.4*  PLT 278    Chemistries  Recent Labs  Lab 06/09/19 0618  NA 138  K 3.7  CL 103  CO2 26  GLUCOSE 197*  BUN 10  CREATININE 0.53  CALCIUM 9.1  MG 2.0    Microbiology Results   Recent Results (from the past 240 hour(s))  Urine culture     Status: Abnormal   Collection Time: 06/02/19  6:35 PM   Specimen: Urine, Random  Result Value Ref Range Status   Specimen Description   Final    URINE, RANDOM Performed at [facility], [address], [place], [place] [address]    Special Requests   Final    NONE Performed at [facility], [address] [place], [place] [address]    Culture (A)  Final    >=100,000 COLONIES/mL ESCHERICHIA COLI Confirmed Extended Spectrum Beta-Lactamase Producer (ESBL).  In bloodstream infections from ESBL organisms, carbapenems are preferred over piperacillin/tazobactam. They are shown to have a lower risk of mortality.    Report Status 06/05/2019 FINAL  Final   Organism ID, Bacteria ESCHERICHIA COLI (A)  Final      Susceptibility   Escherichia coli - MIC*    AMPICILLIN >=32 RESISTANT Resistant     CEFAZOLIN >=64 RESISTANT Resistant     CEFTRIAXONE >=64 RESISTANT Resistant     CIPROFLOXACIN >=4 RESISTANT Resistant     GENTAMICIN <=1  SENSITIVE Sensitive     IMIPENEM <=0.25 SENSITIVE Sensitive     NITROFURANTOIN <=16 SENSITIVE Sensitive     TRIMETH/SULFA >=320 RESISTANT Resistant     AMPICILLIN/SULBACTAM >=32 RESISTANT Resistant     PIP/TAZO 64 INTERMEDIATE Intermediate     Extended ESBL POSITIVE Resistant     * >=100,000 COLONIES/mL ESCHERICHIA COLI  SARS CORONAVIRUS 2 (TAT 6-24 HRS) Nasopharyngeal Nasopharyngeal Swab     Status: None   Collection Time: 06/02/19  9:35 PM   Specimen: Nasopharyngeal Swab  Result Value Ref Range Status   SARS Coronavirus 2 NEGATIVE NEGATIVE Final    Comment: (NOTE) SARS-CoV-2 target nucleic acids are NOT DETECTED. The SARS-CoV-2 RNA is generally detectable in upper and lower respiratory specimens during the acute phase of infection. Negative results do not preclude SARS-CoV-2 infection, do not rule out co-infections with other pathogens, and should not be used as the sole basis for treatment or other patient management decisions. Negative results must be combined with clinical observations, patient history, and epidemiological information. The expected result is Negative. Fact Sheet for Patients: SugarRoll.be Fact Sheet for Healthcare Providers: https://www.woods-mathews.com/ This test is not yet approved or cleared by the [place] FDA and  has been authorized for detection and/or diagnosis of SARS-CoV-2 by FDA under an Emergency Use Authorization (EUA). This EUA will remain  in effect (meaning this test can be used) for the duration of the COVID-19 declaration under Section 56 4(b)(1) of the Act, 21 U.S.C. section 360bbb-3(b)(1), unless the authorization is terminated or revoked sooner. Performed at [facility], [place] [address], [place], [place] [address]   MRSA PCR Screening     Status: Abnormal   Collection Time: 06/03/19  3:17 PM   Specimen: Nasopharyngeal  Result Value Ref Range Status   MRSA by PCR POSITIVE (A)  NEGATIVE Final    Comment:        The GeneXpert MRSA Assay (FDA approved for NASAL specimens only), is one component of a comprehensive MRSA colonization surveillance program. It is not intended to diagnose MRSA infection nor to  guide or monitor treatment for MRSA infections. RESULT CALLED TO, READ BACK BY AND VERIFIED WITH: JUAN RODRIGUEZ 06/03/19 @ [id number]  [place] Performed at [facility], [address] [place], [place] [address]     RADIOLOGY:  No results found.   CODE STATUS:     Code Status Orders  (From admission, onward)         Start     Ordered   06/02/19 2114  Full code  Continuous     06/02/19 2113        Code Status History    Date Active Date Inactive Code Status Order ID Comments User Context   04/27/2019 1551 04/28/2019 1947 Full Code [id number]  Leonides Schanz Inpatient   02/09/2019 1407 02/11/2019 0540 Full Code [id number]  Katha Cabal, MD Inpatient   12/28/2018 0746 12/28/2018 1847 Full Code [id number]  Jesse Sans, RN Inpatient   06/03/2018 1511 06/10/2018 2216 Full Code [id number]  Vaughan Basta, MD ED   04/10/2018 1437 04/17/2018 1946 Full Code [id number]  Loletha Grayer, MD ED   08/29/2016 0516 09/17/2016 1938 Full Code [id number]  Etta Quill, DO ED   07/05/2016 1755 07/09/2016 2131 Full Code [id number]  Shela Leff, MD Inpatient   03/01/2016 1524 03/13/2016 2119 Full Code [id number]  [name], MD Inpatient   03/01/2016 1450 03/01/2016 1524 Full Code [id number]  Marliss Coots, PA-C Inpatient   03/01/2016 1104 03/01/2016 1450 Full Code [id number]  Roland Earl, MD Inpatient   02/28/2016 1915 03/01/2016 1104 Full Code [id number]  Ulyses Amor, PA-C Inpatient   02/12/2014 0708 02/13/2014 1850 Full Code [id number]  Oswald Hillock, MD Inpatient   02/08/2014 1317 02/10/2014 1758 Full Code [id number]  Wylene Simmer, MD Inpatient   02/03/2014 1459 02/08/2014 1317 Full Code [id number]  Radene Gunning, NP Inpatient   Advance Care  Planning Activity    Advance Directive Documentation     Most Recent Value  Type of Advance Directive  Healthcare Power of Attorney  Pre-existing out of facility DNR order (yellow form or pink MOST form)  -  "MOST" Form in Place?  -      TOTAL TIME TAKING CARE OF THIS PATIENT: *40* minutes.    Fritzi Mandes M.D on 06/11/2019 at 12:16 PM  Between 7am to 6pm - Pager - [phone] After 6pm go to [email or web address] - password [email or web address]  [facility]    CC: Primary care physician; [name], MD

## 2019-06-11 NOTE — Progress Notes (Signed)
Pt left at this time via ems for home. Packet  Sent with pt/  Midline d/cd/ pt had bath and in transfer pack. No distress or c/o.vss

## 2019-06-11 NOTE — Discharge Instructions (Addendum)
Vascular Surgery Discharge Instructions 1) You may shower as of Sunday. Keep amputation sites clean and dry. 2) Daily Dressing Changes: Place xeroform to amputation site. Cover with dry gauze. Cover with Kerlix. Cover with ACE.

## 2019-06-14 ENCOUNTER — Other Ambulatory Visit (INDEPENDENT_AMBULATORY_CARE_PROVIDER_SITE_OTHER): Payer: Self-pay | Admitting: Nurse Practitioner

## 2019-06-15 ENCOUNTER — Ambulatory Visit: Admit: 2019-06-15 | Payer: Medicare HMO | Admitting: Vascular Surgery

## 2019-06-15 SURGERY — LOWER EXTREMITY ANGIOGRAPHY
Anesthesia: Moderate Sedation | Site: Leg Lower | Laterality: Left

## 2019-06-16 ENCOUNTER — Encounter: Admission: RE | Payer: Self-pay | Source: Home / Self Care

## 2019-06-16 ENCOUNTER — Inpatient Hospital Stay: Admission: RE | Admit: 2019-06-16 | Payer: Medicare HMO | Source: Home / Self Care | Admitting: Vascular Surgery

## 2019-06-16 SURGERY — AMPUTATION, FOOT, RAY
Anesthesia: General | Laterality: Left

## 2019-06-17 ENCOUNTER — Other Ambulatory Visit (INDEPENDENT_AMBULATORY_CARE_PROVIDER_SITE_OTHER): Payer: Self-pay | Admitting: Vascular Surgery

## 2019-06-17 DIAGNOSIS — Z9862 Peripheral vascular angioplasty status: Secondary | ICD-10-CM

## 2019-06-17 DIAGNOSIS — I739 Peripheral vascular disease, unspecified: Secondary | ICD-10-CM

## 2019-06-21 ENCOUNTER — Encounter (INDEPENDENT_AMBULATORY_CARE_PROVIDER_SITE_OTHER): Payer: Self-pay | Admitting: Nurse Practitioner

## 2019-06-21 ENCOUNTER — Ambulatory Visit (INDEPENDENT_AMBULATORY_CARE_PROVIDER_SITE_OTHER): Payer: Medicare HMO

## 2019-06-21 ENCOUNTER — Ambulatory Visit (INDEPENDENT_AMBULATORY_CARE_PROVIDER_SITE_OTHER): Payer: Medicare HMO | Admitting: Nurse Practitioner

## 2019-06-21 ENCOUNTER — Other Ambulatory Visit (INDEPENDENT_AMBULATORY_CARE_PROVIDER_SITE_OTHER): Payer: Self-pay | Admitting: Nurse Practitioner

## 2019-06-21 ENCOUNTER — Other Ambulatory Visit: Payer: Self-pay

## 2019-06-21 VITALS — BP 137/78 | HR 75 | Resp 16 | Ht 65.0 in

## 2019-06-21 DIAGNOSIS — Z9862 Peripheral vascular angioplasty status: Secondary | ICD-10-CM

## 2019-06-21 DIAGNOSIS — E782 Mixed hyperlipidemia: Secondary | ICD-10-CM

## 2019-06-21 DIAGNOSIS — I739 Peripheral vascular disease, unspecified: Secondary | ICD-10-CM

## 2019-06-21 DIAGNOSIS — I96 Gangrene, not elsewhere classified: Secondary | ICD-10-CM

## 2019-06-21 NOTE — Progress Notes (Signed)
SUBJECTIVE:  Patient ID: [name], female    DOB: Dec 18, 1940, 78 y.o.   MRN: [id number] Chief Complaint  Patient presents with  . Follow-up    ultrasound    HPI  [name] is a 78 y.o. female that recently underwent amputation of the left first and third toes due to gangrene.  Today the patient reports the patient's pain is decently controlled.  The patient is lucid today and endorses a sore bottom due to the stretcher.  Today the first toe amputation appears clean, dry and intact with no dehiscence.  The third toe amputation site show's some copious drainage.   The patient's husband denies any fever, chills, nausea, vomiting or diarrhea following surgery.  Today the patient underwent noninvasive studies, the right ABI was 1.01 with a TBI 0.69.  The left was 0.96 with a TBI 0.47.  The patient had monophasic waveforms bilaterally in the tibial arteries.  This is a chronic change from the previous studies done on 05/31/2019.  The right ABI was 0.69 in the left was 0.47.  TBI's were unable to be obtained at that time.  Past Medical History:  Diagnosis Date  . Acute heart failure ([place])   . Aortic stenosis   . CHF (congestive heart failure) ([place])   . Complication of anesthesia    Hard to wake patient up after having anesthesia  . Diabetes mellitus without complication ([place])   . Diabetic neuropathy (HCC)    Takes Lyrica  . Edema of both legs    Takes Lasix  . GERD (gastroesophageal reflux disease)   . High cholesterol   . HTN (hypertension)   . Hypertension   . PAD (peripheral artery disease) ([place])   . Shortness of breath dyspnea   . Spasm of back muscles   . Stroke ([place])   . Wound, open    Right great toe    Past Surgical History:  Procedure Laterality Date  . AMPUTATION TOE Left 06/09/2019   Procedure: Left third toe and partial great toe amputation and debridement;  Surgeon: Katha Cabal, MD;  Location: [facility];  Service: Vascular;  Laterality: Left;  . BYPASS  GRAFT POPLITEAL TO TIBIAL Right 02/28/2016   Procedure: BYPASS GRAFT RIGHT BELOW KNEE POPLITEAL TO PERONEAL USING REVERSED RIGHT GREATER SAPHENOUS VEIN;  Surgeon: Conrad [place]ale, MD;  Location: [place];  Service: Vascular;  Laterality: Right;  . CYST EXCISION     Abdomen  . EYE SURGERY Bilateral    Cataract removal  . INTRAMEDULLARY (IM) NAIL INTERTROCHANTERIC Left 02/04/2014   Procedure: INTRAMEDULLARY (IM) NAIL INTERTROCHANTRIC FEMORAL;  Surgeon: Mauri Pole, MD;  Location: [place];  Service: Orthopedics;  Laterality: Left;  . IR GENERIC HISTORICAL  03/01/2016   IR ANGIO INTRA EXTRACRAN SEL COM CAROTID INNOMINATE UNI R MOD SED 03/01/2016 [name], MD [facility]  . IR GENERIC HISTORICAL  03/01/2016   IR ENDOVASC INTRACRANIAL INF OTHER THAN THROMBO ART INC DIAG ANGIO 03/01/2016 [name], MD [facility]  . IR GENERIC HISTORICAL  03/01/2016   IR INTRAVSC STENT CERV CAROTID W/O EMB-PROT MOD SED INC ANGIO 03/01/2016 [name], MD [facility]  . IR GENERIC HISTORICAL  06/03/2016   IR RADIOLOGIST EVAL & MGMT 06/03/2016 [facility]  . LOWER EXTREMITY ANGIOGRAPHY Left 02/09/2019   Procedure: LOWER EXTREMITY ANGIOGRAPHY;  Surgeon: Katha Cabal, MD;  Location: [facility];  Service: Cardiovascular;  Laterality: Left;  . LOWER EXTREMITY ANGIOGRAPHY Left 03/10/2019  Procedure: LOWER EXTREMITY ANGIOGRAPHY;  Surgeon: Katha Cabal, MD;  Location: [place];  Service: Cardiovascular;  Laterality: Left;  . LOWER EXTREMITY ANGIOGRAPHY Left 04/27/2019   Procedure: LOWER EXTREMITY ANGIOGRAPHY;  Surgeon: Katha Cabal, MD;  Location: [place] [facility];  Service: Cardiovascular;  Laterality: Left;  . LOWER EXTREMITY ANGIOGRAPHY Left 06/08/2019   Procedure: Lower Extremity Angiography;  Surgeon: Katha Cabal, MD;  Location: [place] [facility];  Service: Cardiovascular;  Laterality: Left;  . ORIF TOE FRACTURE Right 02/08/2014   Procedure: OPEN  REDUCTION INTERNAL FIXATION Right METATARSAL  FRACTURE ;  Surgeon: Wylene Simmer, MD;  Location: [facility];  Service: Orthopedics;  Laterality: Right;  . PERIPHERAL VASCULAR CATHETERIZATION N/A 12/28/2015   Procedure: Abdominal Aortogram w/Lower Extremity;  Surgeon: Conrad Strathmere, MD;  Location: [facility];  Service: Cardiovascular;  Laterality: N/A;  . RADIOLOGY WITH ANESTHESIA N/A 03/01/2016   Procedure: RADIOLOGY WITH ANESTHESIA;  Surgeon: [name], MD;  Location: [place];  Service: Radiology;  Laterality: N/A;  . VEIN HARVEST Right 02/28/2016   Procedure: RIGHT GREATER SAPHENOUS VEIN HARVEST;  Surgeon: Conrad Ravensdale, MD;  Location: [facility];  Service: Vascular;  Laterality: Right;    Social History   Socioeconomic History  . Marital status: Married    Spouse name: Not on file  . Number of children: Not on file  . Years of education: Not on file  . Highest education level: Not on file  Occupational History  . Not on file  Social Needs  . Financial resource strain: Not on file  . Food insecurity    Worry: Not on file    Inability: Not on file  . Transportation needs    Medical: Not on file    Non-medical: Not on file  Tobacco Use  . Smoking status: Never Smoker  . Smokeless tobacco: Never Used  . Tobacco comment: Never smoked  Substance and Sexual Activity  . Alcohol use: No    Alcohol/week: 0.0 standard drinks  . Drug use: No  . Sexual activity: Never  Lifestyle  . Physical activity    Days per week: Not on file    Minutes per session: Not on file  . Stress: Not on file  Relationships  . Social Herbalist on phone: Not on file    Gets together: Not on file    Attends religious service: Not on file    Active member of club or organization: Not on file    Attends meetings of clubs or organizations: Not on file    Relationship status: Not on file  . Intimate partner violence    Fear of current or ex partner: Not on file    Emotionally abused: Not on file     Physically abused: Not on file    Forced sexual activity: Not on file  Other Topics Concern  . Not on file  Social History Narrative  . Not on file    Family History  Problem Relation Age of Onset  . Diabetes Other   . Liver disease Mother   . CVA Father   . Diabetes Father   . Hypertension Father     Allergies  Allergen Reactions  . Iodine Anaphylaxis  . Penicillins Anaphylaxis    Tolerates ceftriaxone, cefazolin Did it involve swelling of the face/tongue/throat, SOB, or low BP? Unknown Did it involve sudden or severe rash/hives, skin peeling, or any reaction on the inside of your  mouth or nose? Unknown Did you need to seek medical attention at a hospital or doctor's office? Unknown When did it last happen?Unknown If all above answers are "NO", may proceed with cephalosporin use.   . Shellfish Allergy Anaphylaxis  . Sulfa Antibiotics Anaphylaxis  . Sulfacetamide Sodium Anaphylaxis  . Sulfasalazine Anaphylaxis  . Eggs Or Egg-Derived Products     Other reaction(s): SHORTNESS OF BREATH  . Morphine And Related Other (See Comments)    Altered mental status     Review of Systems   Review of Systems: Negative Unless Checked Constitutional: [] Weight loss  [] Fever  [] Chills Cardiac: [] Chest pain   []  Atrial Fibrillation  [] Palpitations   [] Shortness of breath when laying flat   [] Shortness of breath with exertion. [] Shortness of breath at rest Vascular:  [] Pain in legs with walking   [] Pain in legs with standing [] Pain in legs when laying flat   [] Claudication    [] Pain in feet when laying flat    [] History of DVT   [] Phlebitis   [x] Swelling in legs   [] Varicose veins   [] Non-healing ulcers Pulmonary:   [] Uses home oxygen   [] Productive cough   [] Hemoptysis   [] Wheeze  [] COPD   [] Asthma Neurologic:  [] Dizziness   [] Seizures  [] Blackouts [x] History of stroke   [] History of TIA  [] Aphasia   [] Temporary Blindness   [x] Weakness or numbness in arm   [x] Weakness or numbness  in leg Musculoskeletal:   [] Joint swelling   [] Joint pain   [] Low back pain  []  History of Knee Replacement [] Arthritis [] back Surgeries  []  Spinal Stenosis    Hematologic:  [] Easy bruising  [] Easy bleeding   [] Hypercoagulable state   [] Anemic Gastrointestinal:  [] Diarrhea   [] Vomiting  [] Gastroesophageal reflux/heartburn   [] Difficulty swallowing. [] Abdominal pain Genitourinary:  [] Chronic kidney disease   [] Difficult urination  [] Anuric   [] Blood in urine [] Frequent urination  [] Burning with urination   [] Hematuria Skin:  [] Rashes   [] Ulcers [x] Wounds Psychological:  [] History of anxiety   []  History of major depression  [x]  Memory Difficulties      OBJECTIVE:   Physical Exam  BP 137/78 (BP Location: Left Arm)   Pulse 75   Resp 16   Ht 5\' 5"  (1.651 m)   BMI 26.78 kg/m   Gen: WD/WN, NAD Head: [place]/AT, No temporalis wasting.  Ear/Nose/Throat: Hearing grossly intact, nares w/o erythema or drainage Eyes: PER, EOMI, sclera nonicteric.  Neck: Supple, no masses.  No JVD.  Pulmonary:  Good air movement, no use of accessory muscles.  Cardiac: RRR Vascular:  Vessel Right Left  Radial Palpable Palpable  Dorsalis Pedis Not Palpable Not Palpable  Posterior Tibial Not Palpable Not Palpable   Gastrointestinal: soft, non-distended. No guarding/no peritoneal signs.  Musculoskeletal: M/S 5/5 throughout.    Patient's left great toe and third toe amputated.  Great toe incision looks well.  Third toe incision has some copious drainage Neurologic: Pain and light touch intact in extremities.  Symmetrical.  Speech is fluent. Motor exam as listed above. Psychiatric: Judgment intact, Mood & affect appropriate for pt's clinical situation. Dermatologic: No Venous rashes. No Ulcers Noted.  No changes consistent with cellulitis. Lymph : No Cervical lymphadenopathy, no lichenification or skin changes of chronic lymphedema.       ASSESSMENT AND PLAN:  1. 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  The concerning point is the patient's third toe amputation.  Swab was taken to culture to  determine if there is infection if antibiotics are needed.  If so we will call some in for the patient, this was discussed with the patient.  Advised to continue with current wound care.  They will follow-up in 6 weeks for [place] to reevaluate the wound.  2. Mixed hyperlipidemia Continue statin as ordered and reviewed, no changes at this time   3. PAD (peripheral artery disease) ([place]) Noninvasive studies today are improved compared to the prior study done on 05/31/2019.  The patient's wounds are improving which is a good sign.  We will repeat noninvasive studies in 3 months as long as patient's wounds continue to heal without issue.   Current Outpatient Medications on File Prior to Visit  Medication Sig Dispense Refill  . acetaminophen (TYLENOL) 500 MG tablet Take 1,000 mg by mouth every 6 (six) hours as needed for mild pain.    [name] albuterol (PROVENTIL HFA;VENTOLIN HFA) 108 (90 Base) MCG/ACT inhaler Inhale 2 puffs into the lungs every 6 (six) hours as needed for wheezing or shortness of breath.     [name] amLODipine (NORVASC) 10 MG tablet Take 10 mg by mouth daily.    [name] aspirin EC 81 MG EC tablet Take 1 tablet (81 mg total) by mouth daily.    [name] atorvastatin (LIPITOR) 20 MG tablet Take 1 tablet (20 mg total) by mouth daily. (Patient taking differently: Take 20 mg by mouth at bedtime. ) 30 tablet 11  . budesonide (PULMICORT) 0.25 MG/2ML nebulizer solution Take 2 mLs (0.25 mg total) by nebulization 2 (two) times daily. 60 mL 12  . carvedilol (COREG) 6.25 MG tablet Take 6.25 mg by mouth 2 (two) times daily with a meal.    . cetirizine (ZYRTEC) 10 MG tablet Take 10 mg by mouth daily.     . Cholecalciferol (VITAMIN D) 50 MCG (2000 UT) CAPS Take 2,000 Units by mouth daily.     . clopidogrel (PLAVIX) 75 MG tablet Take 1 tablet (75 mg total) by mouth daily with breakfast. 30 tablet  0  . docusate sodium (COLACE) 100 MG capsule Take 100 mg by mouth 2 (two) times daily.    . fluticasone (FLONASE) 50 MCG/ACT nasal spray Place 1 spray into both nostrils daily as needed for allergies.     [name] gabapentin (NEURONTIN) 300 MG capsule Take 300 mg by mouth every evening.     . hydrALAZINE (APRESOLINE) 25 MG tablet Take 1 tablet (25 mg total) by mouth every 8 (eight) hours. 60 tablet 0  . insulin glargine (LANTUS) 100 UNIT/ML injection Inject 0.15 mLs (15 Units total) into the skin at bedtime. 1 vial 0  . ipratropium-albuterol (DUONEB) 0.5-2.5 (3) MG/3ML SOLN Take 3 mLs by nebulization 2 (two) times daily. 360 mL 3  . isosorbide dinitrate (ISORDIL) 30 MG tablet Take 30 mg by mouth daily.    . magnesium oxide (MAG-OX) 400 MG tablet Take 400 mg by mouth 2 (two) times daily.     . modafinil (PROVIGIL) 200 MG tablet Take 200 mg by mouth daily.    [name] omeprazole (PRILOSEC) 20 MG capsule Take 1 capsule (20 mg total) by mouth daily.    . OXYGEN Inhale 2 L into the lungs daily as needed (short of breath).    . polyethylene glycol (MIRALAX / GLYCOLAX) packet Take 17 g by mouth 2 (two) times daily. (Patient taking differently: Take 17 g by mouth daily as needed for moderate constipation. ) 14 each 0  . potassium chloride SA (K-DUR,KLOR-CON) 20 MEQ tablet  Take 20 mEq by mouth daily.    . pregabalin (LYRICA) 50 MG capsule Take 50 mg by mouth 2 (two) times daily.    [name] torsemide (DEMADEX) 20 MG tablet Take 1 tablet (20 mg total) by mouth 2 (two) times daily.    . traMADol (ULTRAM) 50 MG tablet Take 1 tablet (50 mg total) by mouth every 6 (six) hours as needed for severe pain. 15 tablet 0  . Vitamin E 180 MG CAPS Take 180 mg by mouth daily.    . nitroGLYCERIN (NITROSTAT) 0.4 MG SL tablet Place 1 tablet (0.4 mg total) under the tongue every 5 (five) minutes as needed for chest pain. (Patient not taking: Reported on 06/21/2019) 3 tablet 12   No current facility-administered medications on file prior to  visit.     There are no Patient Instructions on file for this visit. No follow-ups on file.   Kris Hartmann, NP  This note was completed with Sales executive.  Any errors are purely unintentional.

## 2019-06-25 ENCOUNTER — Telehealth: Payer: Self-pay | Admitting: Internal Medicine

## 2019-06-25 NOTE — Telephone Encounter (Signed)
Spoke with husband [name], regarding Palliative services and he had several questions regarding Palliative.  He stated that patient was receiving services from [facility] as well as [facility] and he wanted to know what we would provide that [facility] doesn't provide.  I told him I would check with my supervisor to find out more about what services they provide and that I would get back with him and he was in agreement with this.  3:40 PM:  Salomon Mast back to explain to him that I found out that [facility] did not provide Palliative services and that it should not interfere with the other services that she was receiving.  He requested that I call and speak with his daughter Wynn Maudlin, who is a Chief Executive Officer and has POA, he gave me her number.  3:55 PM:  Spoke with daughter [name] and explained why I was calling and discussed Palliative services with her.  She said she would speak with patient and her Dad this weekend and either she or her Dad would call me back next week to let me know what they have decided to do.

## 2019-06-28 LAB — AEROBIC CULTURE

## 2019-06-29 ENCOUNTER — Other Ambulatory Visit (INDEPENDENT_AMBULATORY_CARE_PROVIDER_SITE_OTHER): Payer: Self-pay | Admitting: Nurse Practitioner

## 2019-06-30 ENCOUNTER — Telehealth: Payer: Self-pay | Admitting: Internal Medicine

## 2019-06-30 NOTE — Telephone Encounter (Signed)
Rec'd message that husband had called me on 06/29/19, returned his call.  Spoke with him about scheduling Palliative Consult and he was in agreement with this.  I have scheduled a Telephone Consult for 07/02/19 @ 11:30 AM

## 2019-07-02 ENCOUNTER — Other Ambulatory Visit: Payer: Medicare HMO | Admitting: Internal Medicine

## 2019-07-02 ENCOUNTER — Other Ambulatory Visit: Payer: Self-pay

## 2019-07-02 ENCOUNTER — Encounter: Payer: Self-pay | Admitting: Internal Medicine

## 2019-07-02 DIAGNOSIS — Z7189 Other specified counseling: Secondary | ICD-10-CM

## 2019-07-02 DIAGNOSIS — Z515 Encounter for palliative care: Secondary | ICD-10-CM

## 2019-07-02 NOTE — Progress Notes (Addendum)
Nov 27th, 2020 [facility] Palliative Care Consult Note Telephone: [phone]  Fax: [phone]  Due to the current COVID-19 infection/crises, the family prefer, and have given their verbal consent for, a provider visit via telemedicine. HIPPA policies of confidentially were discussed. Video-audio (telehealth) contact was unable to be done due technical barriers from the patient's side.  PATIENT NAME: [name] DOB: 1941-02-13 MRN: [id number] [address], [place], [place]  PRIMARY CARE PROVIDER:    [name], [place] [id number]  Fallon Brown (NP) [facility]   REFERRING PROVIDER:  [name], MD (referred 06/16/2019)  RESPONSIBLE PARTY: (spouse) [name] [phone] or [phone]. (son) [name] [phone]. (dtr) Wynn Maudlin (lawyer)  [phone].  ASSESSMENT / RECOMMENDATIONS:  1. Advance Care Planning: A. Directives: Full Code B. Goals of Care: Keep comfortable. Hoping to regain her ability to walk.  2. Cognitive / Functional status: Patient was sleeping at time of visit. Information from PCG spouse [name]. Patient is A & O. Able to feed herself. Needs total assist with bathing, dressing, toileting (diaper changes q3h), and personal hygiene.  Height 5'5". BMI 24.78 kg/m2. Patient is bed bound (last 6 months); unable to stand and pivot. Two years ago could ambulate with walker. Up in [place] few times a week, when patient's son is also available to help. Her L toe wounds are healing well; spouse or SN change bandage qd. No drainage. She has a pressure injury on R buttocks (post hospitalization) which spouse manages with butt paste; notes healing well. He's careful to rotate her side to side q 2-3 hrs. Bedsores after hospitalization. Her toe pain is well managed with gabapentin, Tylenol, and Lyrica. She wears BiPAP at night (O2), and prn O2 va nasal canula  during the day. Occasional constipation well managed will prn Miralax and bid stool softener.   -I called and left a message with [facility] to see if PT scheduled to visit. Also if could add HHA.   3. Family and community supports: Patient and her husband live in their home, with close f/u by son [name] who manages the pill box filling and assists with patient care (about twice weekly). He lives 10 min drive away. Married to spouse [name] for 83 yrs. [name] is the PCG; his time is totally consumed by patient care. He copes by watching TV, especially politics. Would like to have a HHA if at all possible. I called [facility] to see if PT scheduled to come to the home (post hospitalization). They provide SN for wound care.   4. Follow up Palliative Care Visit: F/U tele-health visit (TC) on 08/05/2019, noon. I left a voice mail with patient's son [name] to f/u on my visit.  I spent 60 minutes providing this consultation from 11:30am to 12:30pm. More than 50% of the time in this consultation was spent coordinating communication.   HISTORY OF PRESENT ILLNESS:  [name] is a 78 y.o. 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 Complication 3rd toe infection. Plan seria (q6mo) noninvasive studies for PAD surveillance. Last 05/31/2019.  (02/28/2016) R BK popliteal to peroneal with reversed R SVG  (03/01/2016) stent cervical carotid. H/O L hip replacement, CHF, aortic stenosis, DM (neuropathy), LE edema, ERD, HLD, HTN, lower back spasms, and stroke, Palliative Care was asked to help address goals of care.   CODE STATUS: Full Code  PPS: 30%  HOSPICE ELIGIBILITY/DIAGNOSIS: TBD  PAST MEDICAL HISTORY:  Past Medical History:  Diagnosis Date  . Acute heart failure ([place])   . Aortic stenosis   . CHF (congestive heart failure) ([place])   . Complication of anesthesia    Hard to wake patient up after having anesthesia  .  Diabetes mellitus without complication ([place])   . Diabetic neuropathy (HCC)    Takes Lyrica  . Edema of both legs    Takes Lasix  . GERD (gastroesophageal reflux disease)   . High cholesterol   . HTN (hypertension)   . Hypertension   . PAD (peripheral artery disease) ([place])   . Shortness of breath dyspnea   . Spasm of back muscles   . Stroke ([place])   . Wound, open    Right great toe    SOCIAL HX:  Social History   Tobacco Use  . Smoking status: Never Smoker  . Smokeless tobacco: Never Used  . Tobacco comment: Never smoked  Substance Use Topics  . Alcohol use: No    Alcohol/week: 0.0 standard drinks    ALLERGIES:  Allergies  Allergen Reactions  . Iodine Anaphylaxis  . Penicillins Anaphylaxis    Tolerates ceftriaxone, cefazolin Did it involve swelling of the face/tongue/throat, SOB, or low BP? Unknown Did it involve sudden or severe rash/hives, skin peeling, or any reaction on the inside of your mouth or nose? Unknown Did you need to seek medical attention at a hospital or doctor's office? Unknown When did it last happen?Unknown If all above answers are "NO", may proceed with cephalosporin use.   . Shellfish Allergy Anaphylaxis  . Sulfa Antibiotics Anaphylaxis  . Sulfacetamide Sodium Anaphylaxis  . Sulfasalazine Anaphylaxis  . Eggs Or Egg-Derived Products     Other reaction(s): SHORTNESS OF BREATH  . Morphine And Related Other (See Comments)    Altered mental status     PERTINENT MEDICATIONS:  Outpatient Encounter Medications as of 07/02/2019  Medication Sig  . acetaminophen (TYLENOL) 500 MG tablet Take 1,000 mg by mouth every 6 (six) hours as needed for mild pain.  [name] albuterol (PROVENTIL HFA;VENTOLIN HFA) 108 (90 Base) MCG/ACT inhaler Inhale 2 puffs into the lungs every 6 (six) hours as needed for wheezing or shortness of breath.   [name] amLODipine (NORVASC) 10 MG tablet Take 10 mg by mouth daily.  [name] aspirin EC 81 MG EC tablet Take 1 tablet (81 mg total) by mouth  daily.  [name] atorvastatin (LIPITOR) 20 MG tablet Take 1 tablet (20 mg total) by mouth daily. (Patient taking differently: Take 20 mg by mouth at bedtime. )  . budesonide (PULMICORT) 0.25 MG/2ML nebulizer solution Take 2 mLs (0.25 mg total) by nebulization 2 (two) times daily.  . carvedilol (COREG) 6.25 MG tablet Take 6.25 mg by mouth 2 (two) times daily with a meal.  . cetirizine (ZYRTEC) 10 MG tablet Take 10 mg by mouth daily.   . Cholecalciferol (VITAMIN D) 50 MCG (2000 UT) CAPS Take 2,000 Units by mouth daily.   . clopidogrel (PLAVIX) 75 MG tablet Take 1 tablet (75 mg total) by mouth daily with breakfast.  . docusate sodium (COLACE) 100 MG capsule Take 100 mg by mouth 2 (two) times daily.  . fluticasone (FLONASE) 50 MCG/ACT nasal spray Place 1 spray into both nostrils daily as needed for allergies.   [name] gabapentin (NEURONTIN) 300 MG capsule Take 300 mg by mouth every evening.   . hydrALAZINE (APRESOLINE) 25 MG tablet Take 1 tablet (25 mg total)  by mouth every 8 (eight) hours.  . insulin glargine (LANTUS) 100 UNIT/ML injection Inject 0.15 mLs (15 Units total) into the skin at bedtime.  [name] ipratropium-albuterol (DUONEB) 0.5-2.5 (3) MG/3ML SOLN Take 3 mLs by nebulization 2 (two) times daily.  . isosorbide dinitrate (ISORDIL) 30 MG tablet Take 30 mg by mouth daily.  . magnesium oxide (MAG-OX) 400 MG tablet Take 400 mg by mouth 2 (two) times daily.   . modafinil (PROVIGIL) 200 MG tablet Take 200 mg by mouth daily.  . nitroGLYCERIN (NITROSTAT) 0.4 MG SL tablet Place 1 tablet (0.4 mg total) under the tongue every 5 (five) minutes as needed for chest pain. (Patient not taking: Reported on 06/21/2019)  . omeprazole (PRILOSEC) 20 MG capsule Take 1 capsule (20 mg total) by mouth daily.  . OXYGEN Inhale 2 L into the lungs daily as needed (short of breath).  . polyethylene glycol (MIRALAX / GLYCOLAX) packet Take 17 g by mouth 2 (two) times daily. (Patient taking differently: Take 17 g by mouth daily as needed  for moderate constipation. )  . potassium chloride SA (K-DUR,KLOR-CON) 20 MEQ tablet Take 20 mEq by mouth daily.  . pregabalin (LYRICA) 50 MG capsule Take 50 mg by mouth 2 (two) times daily.  [name] torsemide (DEMADEX) 20 MG tablet Take 1 tablet (20 mg total) by mouth 2 (two) times daily.  . traMADol (ULTRAM) 50 MG tablet Take 1 tablet (50 mg total) by mouth every 6 (six) hours as needed for severe pain.  . Vitamin E 180 MG CAPS Take 180 mg by mouth daily.   No facility-administered encounter medications on file as of 07/02/2019.     PHYSICAL EXAM:   PE deferred d/t tele-health nature of visit (audio only)  Julianne Handler, NP

## 2019-07-05 ENCOUNTER — Telehealth: Payer: Self-pay | Admitting: Internal Medicine

## 2019-07-05 NOTE — Telephone Encounter (Signed)
12:50pm:   Left message at Dr. Quinn Plowman office, asking if they could send referral request to [facility] (who is currently seeing patient for wound dressing changes) for home PT/Home Health Aid.   Violeta Gelinas NP-[facility] [phone]

## 2019-07-08 ENCOUNTER — Other Ambulatory Visit (INDEPENDENT_AMBULATORY_CARE_PROVIDER_SITE_OTHER): Payer: Self-pay | Admitting: Nurse Practitioner

## 2019-07-12 ENCOUNTER — Other Ambulatory Visit (INDEPENDENT_AMBULATORY_CARE_PROVIDER_SITE_OTHER): Payer: Self-pay | Admitting: Nurse Practitioner

## 2019-07-12 DIAGNOSIS — T8149XA Infection following a procedure, other surgical site, initial encounter: Secondary | ICD-10-CM

## 2019-07-12 MED ORDER — DOXYCYCLINE HYCLATE 100 MG PO CAPS: 100.0000 mg | ORAL_CAPSULE | Freq: Two times a day (BID) | ORAL | 0 refills | Status: DC

## 2019-07-12 NOTE — Progress Notes (Signed)
The swab that we took of her toe wound did show some bacterial growth that will need some antibiotics.  Please confirm the pharmacy for me and I will send it in.

## 2019-08-02 ENCOUNTER — Ambulatory Visit (INDEPENDENT_AMBULATORY_CARE_PROVIDER_SITE_OTHER): Payer: Medicare HMO | Admitting: Vascular Surgery

## 2019-08-05 ENCOUNTER — Encounter: Payer: Self-pay | Admitting: Internal Medicine

## 2019-08-05 ENCOUNTER — Other Ambulatory Visit: Payer: Self-pay

## 2019-08-05 ENCOUNTER — Other Ambulatory Visit: Payer: Medicare HMO | Admitting: Internal Medicine

## 2019-08-05 DIAGNOSIS — Z7189 Other specified counseling: Secondary | ICD-10-CM

## 2019-08-05 DIAGNOSIS — Z515 Encounter for palliative care: Secondary | ICD-10-CM

## 2019-08-05 NOTE — Progress Notes (Signed)
Dec 31st, 2020 [facility] Consult Note Telephone: [phone]  Fax: [phone]   Due to the current COVID-19 infection/crises, the family prefer, and have given their verbal consent for, a provider visit via telemedicine. HIPPA policies of confidentially were discussed. Video-audio (telehealth) contact was unable to be done due technical barriers from the patient's side.  PATIENT NAME: [name]  DOB: [birth year] MRN: [id number] [address] [place], [place]   PRIMARY CARE PROVIDER:    Frazier White, [place] [address]   -[name] (NP) [facility]  -[facility] [phone]. [name], [place] (contracted through patient's insurance carrier [employer]). NP [name] [phone]).    REFERRING PROVIDER:  [name], MD (referred 06/16/2019)   RESPONSIBLE PARTY: (spouse) [name] [phone] or [phone]. (son) [name] [phone]. (dtr) [name] (lawyer) [phone].  [name] prn [phone]   ASSESSMENT / RECOMMENDATIONS:  1. Advance Care Planning: A. Directives: Full Code B. Goals of Care: Keep comfortable. Hoping to regain her ability to walk, or at least get out of bed into a wheelchair.   2. Coordination of care: A. Patient is followed by [name] (contracted through patient's insurance carrier [name]). NP [name] [phone]). [name] plans monthly home, next scheduled Jan 26th.  [name] [phone]):  -Skilled Nurse [name] visits weekly for wound care sacral pressure injuries and toe/foot wounds.  -Physical Therapist [name] [phone]) saw patient single visit post hospitalization Nov 12th. Patient's spouse believes [name] measured patient for reclining wheelchair. I need to check with Ammie to find out what company wheelchair  to be obtained from, and amount of insurance coverage.  I called [name] and left a message for her to contact me to follow up. I'd like to hear her thoughts on a PT re-evaluation; feasibility of increasing patient's strength/mobility. I did discuss with husband [name] that the real work and exercise needs to come from a motivated patient, who will participate daily in the required exercises. I believe the family would benefit from OT consult as well, to help streamline and manage patient's personal care and promote some patient independence. Also, would like to see if patient and his son would benefit from additional instruction on safe/proper use of [place] lift.   -I left a message for patient's PCP Dr. Sherril White for a referral for a home health aide.   C. Transportation:   - Currently, patient transports back and forth to her clinic appointments via [name] transportation [phone]) out of [place], which costs the family $300 round trip. Patient has to use this service d/t need for patient to lay on a stretcher. Spouse's goal, if can obtain a reclining wheelchair, is to go back to utilizing [name] ([name] system, [place] [phone]). I left a message for their director [name] to contact me so I can get a sense if patient could eventually go back to utilizing those services if she is in a reclining wheelchair. Mr. [name] has a [employer] fast streamer; he assures me that his son, who visits multi times a day, would be available to help patient transport in/out of wheelchair via her Harrel Lemon lift.    3. Cognitive / Functional status: (from previous note) Patient is A & O. Able to feed herself. Needs total assist with bathing, dressing, toileting (diaper changes q3h), and  personal hygiene.  Height 5'5". BMI 24.78 kg/m2. Patient is bed bound (last 6 months); unable to stand and pivot. Two years ago could ambulate with walker. Up in [place] few times weekly when patient's son is also available  to help. Patient has L toe wound, and a pressure injury R buttocks (post hospitalization). Her toe pain is well managed with gabapentin, Tylenol, and Lyrica. She wears BiPAP at night (O2), and prn O2 va nasal canula during the day. Occasional constipation well managed will prn Miralax and bid stool softener.   4. Family and community supports: (from prior note) Patient and her husband live in their home, with close f/u by son [name] who manages the pill box filling and assists with patient care (about twice weekly). He lives 10 min drive away. Married to spouse [name] for 73 yrs. [name] is the PCG; his time is totally consumed by patient care. He copes by watching TV, especially politics.  [name] is scheduled for a urinary tract procedure on Jan 7th (outpatient)  5. Follow up Palliative: Will call [name] early next week to f/u responses from [place] (have they received referral for HHA). F.\/U  Dr [name] and PT evaluations/wheelchair status.   I spent 60 minutes providing this consultation from 2pm to 3pm. More than 50% of the time in this consultation was spent coordinating communication.    HISTORY OF PRESENT ILLNESS:  [name] is a 78 y.o. 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 Complication 3rd toe infection. Plan seria (q18mo) noninvasive studies for PAD surveillance. Last 05/31/2019.  (02/28/2016) R BK popliteal to peroneal with reversed R SVG  (03/01/2016) stent cervical carotid. H/O L hip replacement, CHF, aortic stenosis, DM (neuropathy), LE edema, ERD, HLD, HTN, lower back spasms, and stroke, This is a f/u Palliative Care Visit from Nov 27th.    CODE STATUS: Full Code   PPS: 30%   HOSPICE ELIGIBILITY/DIAGNOSIS: TBD  PAST MEDICAL HISTORY:  Past Medical History:  Diagnosis Date  . Acute heart failure ([name])   . Aortic stenosis   . CHF (congestive heart failure) ([name])   . Complication of anesthesia     Hard to wake patient up after having anesthesia  . Diabetes mellitus without complication ([name])   . Diabetic neuropathy ([name])    Takes Lyrica  . Edema of both legs    Takes Lasix  . GERD (gastroesophageal reflux disease)   . High cholesterol   . HTN (hypertension)   . Hypertension   . PAD (peripheral artery disease) ([place])   . Shortness of breath dyspnea   . Spasm of back muscles   . Stroke ([place])   . Wound, open    Right great toe    SOCIAL HX:  Social History   Tobacco Use  . Smoking status: Never Smoker  . Smokeless tobacco: Never Used  . Tobacco comment: Never smoked  Substance Use Topics  . Alcohol use: No    Alcohol/week: 0.0 standard drinks    ALLERGIES:  Allergies  Allergen Reactions  . Iodine Anaphylaxis  . Penicillins Anaphylaxis    Tolerates ceftriaxone, cefazolin Did it involve swelling of the face/tongue/throat, SOB, or low BP? Unknown Did it involve sudden or severe rash/hives, skin peeling, or any reaction on the inside of your mouth or nose? Unknown Did you need to seek medical attention at a hospital or doctor's office? Unknown When did it last happen?Unknown If all above answers  are "NO", may proceed with cephalosporin use.   . Shellfish Allergy Anaphylaxis  . Sulfa Antibiotics Anaphylaxis  . Sulfacetamide Sodium Anaphylaxis  . Sulfasalazine Anaphylaxis  . Eggs Or Egg-Derived Products     Other reaction(s): SHORTNESS OF BREATH  . Morphine And Related Other (See Comments)    Altered mental status     PERTINENT MEDICATIONS:  Outpatient Encounter Medications as of 08/05/2019  Medication Sig  . acetaminophen (TYLENOL) 500 MG tablet Take 1,000 mg by mouth every 6 (six) hours as needed for mild pain.  [name] albuterol (PROVENTIL HFA;VENTOLIN HFA) 108 (90 Base) MCG/ACT inhaler Inhale 2 puffs into the lungs every 6 (six) hours as needed for wheezing or shortness of breath.   [name] amLODipine (NORVASC) 10 MG tablet Take 10 mg by mouth daily.  [name] aspirin  EC 81 MG EC tablet Take 1 tablet (81 mg total) by mouth daily.  [name] atorvastatin (LIPITOR) 20 MG tablet Take 1 tablet (20 mg total) by mouth daily. (Patient taking differently: Take 20 mg by mouth at bedtime. )  . budesonide (PULMICORT) 0.25 MG/2ML nebulizer solution Take 2 mLs (0.25 mg total) by nebulization 2 (two) times daily.  . carvedilol (COREG) 6.25 MG tablet Take 6.25 mg by mouth 2 (two) times daily with a meal.  . cetirizine (ZYRTEC) 10 MG tablet Take 10 mg by mouth daily.   . Cholecalciferol (VITAMIN D) 50 MCG (2000 UT) CAPS Take 2,000 Units by mouth daily.   . clopidogrel (PLAVIX) 75 MG tablet Take 1 tablet (75 mg total) by mouth daily with breakfast.  . docusate sodium (COLACE) 100 MG capsule Take 100 mg by mouth 2 (two) times daily.  [name] doxycycline (VIBRAMYCIN) 100 MG capsule Take 1 capsule (100 mg total) by mouth 2 (two) times daily.  . fluticasone (FLONASE) 50 MCG/ACT nasal spray Place 1 spray into both nostrils daily as needed for allergies.   [name] gabapentin (NEURONTIN) 300 MG capsule Take 300 mg by mouth every evening.   . hydrALAZINE (APRESOLINE) 25 MG tablet Take 1 tablet (25 mg total) by mouth every 8 (eight) hours.  . insulin glargine (LANTUS) 100 UNIT/ML injection Inject 0.15 mLs (15 Units total) into the skin at bedtime.  [name] ipratropium-albuterol (DUONEB) 0.5-2.5 (3) MG/3ML SOLN Take 3 mLs by nebulization 2 (two) times daily.  . isosorbide dinitrate (ISORDIL) 30 MG tablet Take 30 mg by mouth daily.  . magnesium oxide (MAG-OX) 400 MG tablet Take 400 mg by mouth 2 (two) times daily.   . modafinil (PROVIGIL) 200 MG tablet Take 200 mg by mouth daily.  . nitroGLYCERIN (NITROSTAT) 0.4 MG SL tablet Place 1 tablet (0.4 mg total) under the tongue every 5 (five) minutes as needed for chest pain. (Patient not taking: Reported on 06/21/2019)  . omeprazole (PRILOSEC) 20 MG capsule Take 1 capsule (20 mg total) by mouth daily.  . OXYGEN Inhale 2 L into the lungs daily as needed (short of  breath).  . polyethylene glycol (MIRALAX / GLYCOLAX) packet Take 17 g by mouth 2 (two) times daily. (Patient taking differently: Take 17 g by mouth daily as needed for moderate constipation. )  . potassium chloride SA (K-DUR,KLOR-CON) 20 MEQ tablet Take 20 mEq by mouth daily.  . pregabalin (LYRICA) 50 MG capsule Take 50 mg by mouth 2 (two) times daily.  [name] torsemide (DEMADEX) 20 MG tablet Take 1 tablet (20 mg total) by mouth 2 (two) times daily.  . traMADol (ULTRAM) 50 MG tablet Take 1 tablet (50 mg total) by  mouth every 6 (six) hours as needed for severe pain.  . Vitamin E 180 MG CAPS Take 180 mg by mouth daily.   No facility-administered encounter medications on file as of 08/05/2019.    PHYSICAL EXAM:   PE deferred d/t tele-health nature of visit (audio only)    Julianne Handler, NP

## 2019-08-09 ENCOUNTER — Encounter (INDEPENDENT_AMBULATORY_CARE_PROVIDER_SITE_OTHER): Payer: Self-pay | Admitting: Vascular Surgery

## 2019-08-09 ENCOUNTER — Other Ambulatory Visit: Payer: Self-pay

## 2019-08-09 ENCOUNTER — Ambulatory Visit (INDEPENDENT_AMBULATORY_CARE_PROVIDER_SITE_OTHER): Payer: Medicare HMO | Admitting: Vascular Surgery

## 2019-08-09 VITALS — BP 162/62 | HR 96 | Resp 18

## 2019-08-09 DIAGNOSIS — E1159 Type 2 diabetes mellitus with other circulatory complications: Secondary | ICD-10-CM

## 2019-08-09 DIAGNOSIS — Z794 Long term (current) use of insulin: Secondary | ICD-10-CM

## 2019-08-09 DIAGNOSIS — E782 Mixed hyperlipidemia: Secondary | ICD-10-CM

## 2019-08-09 DIAGNOSIS — I10 Essential (primary) hypertension: Secondary | ICD-10-CM

## 2019-08-09 DIAGNOSIS — I70263 Atherosclerosis of native arteries of extremities with gangrene, bilateral legs: Secondary | ICD-10-CM

## 2019-08-09 NOTE — Progress Notes (Signed)
MRN : [id number]  [name] is a 79 y.o. (01-15-1941) female who presents with chief complaint of  Chief Complaint  Patient presents with  . Follow-up    6week follow up  .  History of Present Illness:  The patient returns to the office for followup and review status post angiogram with intervention on 06/08/2019.   Percutaneous transluminal angioplasty left superficial femoral and popliteal arteries to 4 mm with Lutonix drug-eluting balloon.  Percutaneous transluminal angioplasty left anterior tibial to 3 mm with an ultra score balloon  The patient notes improvement in the lower extremity symptoms. No interval shortening of the patient's claudication distance or rest pain symptoms. Previous wounds have now healed.  No new ulcers or wounds have occurred since the last visit.  There have been no significant changes to the patient's overall health care.  The patient denies amaurosis fugax or recent TIA symptoms. There are no recent neurological changes noted. The patient denies history of DVT, PE or superficial thrombophlebitis. The patient denies recent episodes of angina or shortness of breath.     No outpatient medications have been marked as taking for the 08/09/19 encounter (Office Visit) with Delana Meyer, Dolores Lory, MD.    Past Medical History:  Diagnosis Date  . Acute heart failure ([place])   . Aortic stenosis   . CHF (congestive heart failure) ([place])   . Complication of anesthesia    Hard to wake patient up after having anesthesia  . Diabetes mellitus without complication ([place])   . Diabetic neuropathy ([facility])    Takes Lyrica  . Edema of both legs    Takes Lasix  . GERD (gastroesophageal reflux disease)   . High cholesterol   . HTN (hypertension)   . Hypertension   . PAD (peripheral artery disease) ([place])   . Shortness of breath dyspnea   . Spasm of back muscles   . Stroke ([place])   . Wound, open    Right great toe    Past Surgical History:  Procedure Laterality Date   . AMPUTATION TOE Left 06/09/2019   Procedure: Left third toe and partial great toe amputation and debridement;  Surgeon: Katha Cabal, MD;  Location: [facility];  Service: Vascular;  Laterality: Left;  . BYPASS GRAFT POPLITEAL TO TIBIAL Right 02/28/2016   Procedure: BYPASS GRAFT RIGHT BELOW KNEE POPLITEAL TO PERONEAL USING REVERSED RIGHT GREATER SAPHENOUS VEIN;  Surgeon: Conrad Thayer, MD;  Location: [facility];  Service: Vascular;  Laterality: Right;  . CYST EXCISION     Abdomen  . EYE SURGERY Bilateral    Cataract removal  . INTRAMEDULLARY (IM) NAIL INTERTROCHANTERIC Left 02/04/2014   Procedure: INTRAMEDULLARY (IM) NAIL INTERTROCHANTRIC FEMORAL;  Surgeon: Mauri Pole, MD;  Location: [facility];  Service: Orthopedics;  Laterality: Left;  . IR GENERIC HISTORICAL  03/01/2016   IR ANGIO INTRA EXTRACRAN SEL COM CAROTID INNOMINATE UNI R MOD SED 03/01/2016 [name], MD [facility]  . IR GENERIC HISTORICAL  03/01/2016   IR ENDOVASC INTRACRANIAL INF OTHER THAN THROMBO ART INC DIAG ANGIO 03/01/2016 [name], MD [facility]  . IR GENERIC HISTORICAL  03/01/2016   IR INTRAVSC STENT CERV CAROTID W/O EMB-PROT MOD SED INC ANGIO 03/01/2016 [name], MD [facility]  . IR GENERIC HISTORICAL  06/03/2016   IR RADIOLOGIST EVAL & MGMT 06/03/2016 [facility]  . LOWER EXTREMITY ANGIOGRAPHY Left 02/09/2019   Procedure: LOWER EXTREMITY ANGIOGRAPHY;  Surgeon: Katha Cabal, MD;  Location: [facility];  Service: Cardiovascular;  Laterality: Left;  . LOWER EXTREMITY ANGIOGRAPHY Left 03/10/2019   Procedure: LOWER EXTREMITY ANGIOGRAPHY;  Surgeon: Katha Cabal, MD;  Location: [facility];  Service: Cardiovascular;  Laterality: Left;  . LOWER EXTREMITY ANGIOGRAPHY Left 04/27/2019   Procedure: LOWER EXTREMITY ANGIOGRAPHY;  Surgeon: Katha Cabal, MD;  Location: [facility];  Service: Cardiovascular;  Laterality: Left;  . LOWER EXTREMITY ANGIOGRAPHY Left 06/08/2019    Procedure: Lower Extremity Angiography;  Surgeon: Katha Cabal, MD;  Location: [facility];  Service: Cardiovascular;  Laterality: Left;  . ORIF TOE FRACTURE Right 02/08/2014   Procedure: OPEN REDUCTION INTERNAL FIXATION Right METATARSAL  FRACTURE ;  Surgeon: Wylene Simmer, MD;  Location: [facility];  Service: Orthopedics;  Laterality: Right;  . PERIPHERAL VASCULAR CATHETERIZATION N/A 12/28/2015   Procedure: Abdominal Aortogram w/Lower Extremity;  Surgeon: Conrad [place], MD;  Location: [facility];  Service: Cardiovascular;  Laterality: N/A;  . RADIOLOGY WITH ANESTHESIA N/A 03/01/2016   Procedure: RADIOLOGY WITH ANESTHESIA;  Surgeon: [name], MD;  Location: [facility];  Service: Radiology;  Laterality: N/A;  . VEIN HARVEST Right 02/28/2016   Procedure: RIGHT GREATER SAPHENOUS VEIN HARVEST;  Surgeon: Conrad Haddonfield, MD;  Location: [facility];  Service: Vascular;  Laterality: Right;    Social History Social History   Tobacco Use  . Smoking status: Never Smoker  . Smokeless tobacco: Never Used  . Tobacco comment: Never smoked  Substance Use Topics  . Alcohol use: No    Alcohol/week: 0.0 standard drinks  . Drug use: No    Family History Family History  Problem Relation Age of Onset  . Diabetes Other   . Liver disease Mother   . CVA Father   . Diabetes Father   . Hypertension Father     Allergies  Allergen Reactions  . Iodine Anaphylaxis  . Penicillins Anaphylaxis    Tolerates ceftriaxone, cefazolin Did it involve swelling of the face/tongue/throat, SOB, or low BP? Unknown Did it involve sudden or severe rash/hives, skin peeling, or any reaction on the inside of your mouth or nose? Unknown Did you need to seek medical attention at a hospital or doctor's office? Unknown When did it last happen?Unknown If all above answers are "NO", may proceed with cephalosporin use.   . Shellfish Allergy Anaphylaxis  . Sulfa Antibiotics Anaphylaxis  . Sulfacetamide Sodium  Anaphylaxis  . Sulfasalazine Anaphylaxis  . Eggs Or Egg-Derived Products     Other reaction(s): SHORTNESS OF BREATH  . Morphine And Related Other (See Comments)    Altered mental status     REVIEW OF SYSTEMS (Negative unless checked)  Constitutional: [] Weight loss  [] Fever  [] Chills Cardiac: [] Chest pain   [] Chest pressure   [] Palpitations   [] Shortness of breath when laying flat   [] Shortness of breath with exertion. Vascular:  [] Pain in legs with walking   [] Pain in legs at rest  [] History of DVT   [] Phlebitis   [x] Swelling in legs   [] Varicose veins   [x] Non-healing ulcers Pulmonary:   [] Uses home oxygen   [] Productive cough   [] Hemoptysis   [] Wheeze  [] COPD   [] Asthma Neurologic:  [] Dizziness   [] Seizures   [] History of stroke   [] History of TIA  [] Aphasia   [] Vissual changes   [] Weakness or numbness in arm   [] Weakness or numbness in leg Musculoskeletal:   [] Joint swelling   [] Joint pain   [x] Low back pain Hematologic:  [] Easy bruising  [] Easy  bleeding   [] Hypercoagulable state   [] Anemic Gastrointestinal:  [] Diarrhea   [] Vomiting  [] Gastroesophageal reflux/heartburn   [] Difficulty swallowing. Genitourinary:  [] Chronic kidney disease   [] Difficult urination  [] Frequent urination   [] Blood in urine Skin:  [] Rashes   [] Ulcers  Psychological:  [] History of anxiety   []  History of major depression.  Physical Examination  Vitals:   08/09/19 1426  BP: (!) 162/62  Pulse: 96  Resp: 18   There is no height or weight on file to calculate BMI. Gen: WD/WN, seen in a gurney complaining of buttock pain Head: [place]/AT, No temporalis wasting.  Ear/Nose/Throat: Hearing grossly intact, nares w/o erythema or drainage Eyes: PER, EOMI, sclera nonicteric.  Neck: Supple, no large masses.   Pulmonary:  Good air movement, no audible wheezing bilaterally, no use of accessory muscles.  Cardiac: RRR, no JVD Vascular: There is 3+ edema bilaterally.  The area of eschar at the base of the right great toe  has healed.  The left great toe amputation site has healed with the exception of a small area of the suture line on the dorsal surface.  This appears to be a small stitch abscess.  Both feet are warm hyperemic with brisk capillary refill.   Vessel Right Left  Radial Palpable Palpable  PT Not Palpable Not Palpable  DP Not Palpable Not Palpable  Gastrointestinal: Non-distended. No guarding/no peritoneal signs.  Musculoskeletal: M/S 5/5 throughout.  No deformity or atrophy.  Neurologic: CN 2-12 intact. Symmetrical.  Speech is fluent. Motor exam as listed above. Psychiatric: Judgment intact, Mood & affect appropriate for pt's clinical situation. Dermatologic: No rashes or ulcers noted.  No changes consistent with cellulitis. Lymph : No lichenification or skin changes of chronic lymphedema.  CBC Lab Results  Component Value Date   WBC 11.8 (H) 06/09/2019   HGB 10.5 (L) 06/09/2019   HCT 33.4 (L) 06/09/2019   MCV 81.7 06/09/2019   PLT 278 06/09/2019    BMET    Component Value Date/Time   NA 138 06/09/2019 0618   K 3.7 06/09/2019 0618   CL 103 06/09/2019 0618   CO2 26 06/09/2019 0618   GLUCOSE 197 (H) 06/09/2019 0618   BUN 10 06/09/2019 0618   CREATININE 0.53 06/09/2019 0618   CALCIUM 9.1 06/09/2019 0618   GFRNONAA >60 06/09/2019 0618   GFRAA >60 06/09/2019 0618   CrCl cannot be calculated (Patient's most recent lab result is older than the maximum 21 days allowed.).  COAG Lab Results  Component Value Date   INR 1.1 06/09/2019   INR 1.2 12/23/2018   INR 1.07 09/16/2016    Radiology No results found.   Assessment/Plan 1. 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  The amputation sites are clearly viable and healing well.  I believe the 1 small area at the dorsum of the great toe amputation site represents a stitch abscess.  Also of note we have successfully eliminated the patient's lower extremity  pain.  Her primary concern at this point is the pressure on her buttock and she has a known sacral decub.  We will follow up with an ABI in 3 months.  2. Essential hypertension Continue antihypertensive medications as already ordered, these medications have been reviewed and there are no changes at this time.   3. Type 2 diabetes mellitus with other circulatory complication, with long-term current use of insulin ([facility]) Continue hypoglycemic medications as already ordered, these medications have been reviewed  and there are no changes at this time.  Hgb A1C to be monitored as already arranged by primary service   4. Mixed hyperlipidemia Continue statin as ordered and reviewed, no changes at this time     Hortencia Pilar, MD  08/09/2019 2:27 PM

## 2019-09-03 ENCOUNTER — Telehealth: Payer: Self-pay | Admitting: Internal Medicine

## 2019-09-03 NOTE — Telephone Encounter (Signed)
Late entry: [facility] NP [place] [phone]) following patient in-home visits q month. Patient's husband, per earlier conversations, wishes to continue with [facility], and discontinue [facility] NP d/t duplication of services.  I did update NP Montoya regarding this. Also, communicated to her option of having [facility] [phone]) come to patient's home to measure patient for wheelchair. It can take a few days to 3 months for [facility] to gain approval from an insurance company to provide chair.  Violeta Gelinas NP-C [phone]

## 2019-09-21 ENCOUNTER — Telehealth (INDEPENDENT_AMBULATORY_CARE_PROVIDER_SITE_OTHER): Payer: Self-pay

## 2019-09-22 NOTE — Telephone Encounter (Signed)
I left VM a for [name] of [facility] Making her aware that the verbal order she requested was approved by NP Arna Medici and also that if she had any questions she could give [place] a call back and I left the number to the facility.

## 2019-09-22 NOTE — Telephone Encounter (Signed)
She may be asking for a verbal order and if so that is fine.

## 2019-11-03 ENCOUNTER — Other Ambulatory Visit (INDEPENDENT_AMBULATORY_CARE_PROVIDER_SITE_OTHER): Payer: Self-pay | Admitting: Vascular Surgery

## 2019-11-03 DIAGNOSIS — Z9862 Peripheral vascular angioplasty status: Secondary | ICD-10-CM

## 2019-11-03 DIAGNOSIS — I70263 Atherosclerosis of native arteries of extremities with gangrene, bilateral legs: Secondary | ICD-10-CM

## 2019-11-08 ENCOUNTER — Ambulatory Visit (INDEPENDENT_AMBULATORY_CARE_PROVIDER_SITE_OTHER): Payer: Medicare HMO | Admitting: Vascular Surgery

## 2019-11-08 ENCOUNTER — Encounter (INDEPENDENT_AMBULATORY_CARE_PROVIDER_SITE_OTHER): Payer: Medicare HMO

## 2019-11-22 ENCOUNTER — Ambulatory Visit (INDEPENDENT_AMBULATORY_CARE_PROVIDER_SITE_OTHER): Payer: Medicare HMO

## 2019-11-22 ENCOUNTER — Ambulatory Visit (INDEPENDENT_AMBULATORY_CARE_PROVIDER_SITE_OTHER): Payer: Medicare HMO | Admitting: Vascular Surgery

## 2019-11-22 ENCOUNTER — Other Ambulatory Visit: Payer: Self-pay

## 2019-11-22 DIAGNOSIS — Z9862 Peripheral vascular angioplasty status: Secondary | ICD-10-CM | POA: Diagnosis not present

## 2019-11-22 DIAGNOSIS — I70263 Atherosclerosis of native arteries of extremities with gangrene, bilateral legs: Secondary | ICD-10-CM

## 2019-11-26 ENCOUNTER — Encounter (INDEPENDENT_AMBULATORY_CARE_PROVIDER_SITE_OTHER): Payer: Self-pay | Admitting: Vascular Surgery

## 2020-03-30 ENCOUNTER — Encounter: Payer: Self-pay | Admitting: Emergency Medicine

## 2020-03-30 ENCOUNTER — Other Ambulatory Visit: Payer: Self-pay

## 2020-03-30 ENCOUNTER — Emergency Department: Payer: Medicare HMO

## 2020-03-30 ENCOUNTER — Inpatient Hospital Stay
Admission: EM | Admit: 2020-03-30 | Discharge: 2020-04-08 | DRG: 271 | Disposition: A | Discharge status: Home Health | Payer: Medicare HMO | Attending: Internal Medicine | Admitting: Internal Medicine

## 2020-03-30 DIAGNOSIS — Z9981 Dependence on supplemental oxygen: Secondary | ICD-10-CM | POA: Diagnosis not present

## 2020-03-30 DIAGNOSIS — E785 Hyperlipidemia, unspecified: Secondary | ICD-10-CM | POA: Diagnosis present

## 2020-03-30 DIAGNOSIS — E1140 Type 2 diabetes mellitus with diabetic neuropathy, unspecified: Secondary | ICD-10-CM | POA: Diagnosis present

## 2020-03-30 DIAGNOSIS — Z8673 Personal history of transient ischemic attack (TIA), and cerebral infarction without residual deficits: Secondary | ICD-10-CM

## 2020-03-30 DIAGNOSIS — E109 Type 1 diabetes mellitus without complications: Secondary | ICD-10-CM | POA: Diagnosis present

## 2020-03-30 DIAGNOSIS — Z833 Family history of diabetes mellitus: Secondary | ICD-10-CM

## 2020-03-30 DIAGNOSIS — Z880 Allergy status to penicillin: Secondary | ICD-10-CM

## 2020-03-30 DIAGNOSIS — I110 Hypertensive heart disease with heart failure: Secondary | ICD-10-CM | POA: Diagnosis present

## 2020-03-30 DIAGNOSIS — Z823 Family history of stroke: Secondary | ICD-10-CM | POA: Diagnosis not present

## 2020-03-30 DIAGNOSIS — E119 Type 2 diabetes mellitus without complications: Secondary | ICD-10-CM

## 2020-03-30 DIAGNOSIS — L089 Local infection of the skin and subcutaneous tissue, unspecified: Secondary | ICD-10-CM

## 2020-03-30 DIAGNOSIS — T148XXA Other injury of unspecified body region, initial encounter: Secondary | ICD-10-CM | POA: Diagnosis not present

## 2020-03-30 DIAGNOSIS — J449 Chronic obstructive pulmonary disease, unspecified: Secondary | ICD-10-CM | POA: Diagnosis present

## 2020-03-30 DIAGNOSIS — L03032 Cellulitis of left toe: Secondary | ICD-10-CM | POA: Diagnosis present

## 2020-03-30 DIAGNOSIS — Z20822 Contact with and (suspected) exposure to covid-19: Secondary | ICD-10-CM | POA: Diagnosis present

## 2020-03-30 DIAGNOSIS — M869 Osteomyelitis, unspecified: Secondary | ICD-10-CM | POA: Diagnosis present

## 2020-03-30 DIAGNOSIS — Z8249 Family history of ischemic heart disease and other diseases of the circulatory system: Secondary | ICD-10-CM | POA: Diagnosis not present

## 2020-03-30 DIAGNOSIS — I5032 Chronic diastolic (congestive) heart failure: Secondary | ICD-10-CM | POA: Diagnosis present

## 2020-03-30 DIAGNOSIS — L0390 Cellulitis, unspecified: Secondary | ICD-10-CM | POA: Diagnosis present

## 2020-03-30 DIAGNOSIS — E1159 Type 2 diabetes mellitus with other circulatory complications: Secondary | ICD-10-CM | POA: Diagnosis not present

## 2020-03-30 DIAGNOSIS — A419 Sepsis, unspecified organism: Secondary | ICD-10-CM | POA: Diagnosis not present

## 2020-03-30 DIAGNOSIS — I10 Essential (primary) hypertension: Secondary | ICD-10-CM | POA: Diagnosis not present

## 2020-03-30 DIAGNOSIS — K219 Gastro-esophageal reflux disease without esophagitis: Secondary | ICD-10-CM | POA: Diagnosis present

## 2020-03-30 DIAGNOSIS — E1142 Type 2 diabetes mellitus with diabetic polyneuropathy: Secondary | ICD-10-CM | POA: Diagnosis not present

## 2020-03-30 DIAGNOSIS — I96 Gangrene, not elsewhere classified: Secondary | ICD-10-CM

## 2020-03-30 DIAGNOSIS — I70262 Atherosclerosis of native arteries of extremities with gangrene, left leg: Secondary | ICD-10-CM | POA: Diagnosis not present

## 2020-03-30 DIAGNOSIS — I8291 Chronic embolism and thrombosis of unspecified vein: Secondary | ICD-10-CM | POA: Diagnosis present

## 2020-03-30 DIAGNOSIS — I739 Peripheral vascular disease, unspecified: Secondary | ICD-10-CM | POA: Diagnosis present

## 2020-03-30 DIAGNOSIS — Z7982 Long term (current) use of aspirin: Secondary | ICD-10-CM | POA: Diagnosis not present

## 2020-03-30 DIAGNOSIS — E1152 Type 2 diabetes mellitus with diabetic peripheral angiopathy with gangrene: Secondary | ICD-10-CM | POA: Diagnosis present

## 2020-03-30 DIAGNOSIS — Z794 Long term (current) use of insulin: Secondary | ICD-10-CM | POA: Diagnosis not present

## 2020-03-30 DIAGNOSIS — E1149 Type 2 diabetes mellitus with other diabetic neurological complication: Secondary | ICD-10-CM | POA: Diagnosis not present

## 2020-03-30 LAB — COMPREHENSIVE METABOLIC PANEL
ALT: 13 U/L (ref 0–44)
AST: 17 U/L (ref 15–41)
Albumin: 3.2 g/dL — ABNORMAL LOW (ref 3.5–5.0)
Alkaline Phosphatase: 69 U/L (ref 38–126)
Anion gap: 7 (ref 5–15)
BUN: 13 mg/dL (ref 8–23)
CO2: 30 mmol/L (ref 22–32)
Calcium: 9.3 mg/dL (ref 8.9–10.3)
Chloride: 99 mmol/L (ref 98–111)
Creatinine, Ser: 0.54 mg/dL (ref 0.44–1.00)
GFR calc Af Amer: >60 mL/min (ref >60)
GFR calc non Af Amer: >60 mL/min (ref >60)
Glucose, Bld: 265 mg/dL — ABNORMAL HIGH (ref 70–99)
Potassium: 4.3 mmol/L (ref 3.5–5.1)
Sodium: 136 mmol/L (ref 135–145)
Total Bilirubin: 0.6 mg/dL (ref 0.3–1.2)
Total Protein: 6.6 g/dL (ref 6.5–8.1)

## 2020-03-30 LAB — SARS CORONAVIRUS 2 BY RT PCR (HOSPITAL ORDER, PERFORMED IN ~~LOC~~ HOSPITAL LAB): SARS Coronavirus 2: NEGATIVE

## 2020-03-30 LAB — SEDIMENTATION RATE: Sed Rate: 30 mm/hr (ref 0–30)

## 2020-03-30 LAB — PROTIME-INR
INR: 1.1 (ref 0.8–1.2)
Prothrombin Time: 13.4 seconds (ref 11.4–15.2)

## 2020-03-30 LAB — CBC WITH DIFFERENTIAL/PLATELET
Abs Immature Granulocytes: 0.04 10*3/uL (ref 0.00–0.07)
Basophils Absolute: 0.1 10*3/uL (ref 0.0–0.1)
Basophils Relative: 1 %
Eosinophils Absolute: 0.4 10*3/uL (ref 0.0–0.5)
Eosinophils Relative: 3 %
HCT: 37.6 % (ref 36.0–46.0)
Hemoglobin: 12 g/dL (ref 12.0–15.0)
Immature Granulocytes: 0 %
Lymphocytes Relative: 17 %
Lymphs Abs: 2.4 10*3/uL (ref 0.7–4.0)
MCH: 26.7 pg (ref 26.0–34.0)
MCHC: 31.9 g/dL (ref 30.0–36.0)
MCV: 83.6 fL (ref 80.0–100.0)
Monocytes Absolute: 0.8 10*3/uL (ref 0.1–1.0)
Monocytes Relative: 6 %
Neutro Abs: 10.2 10*3/uL — ABNORMAL HIGH (ref 1.7–7.7)
Neutrophils Relative %: 73 %
Platelets: 305 10*3/uL (ref 150–400)
RBC: 4.5 MIL/uL (ref 3.87–5.11)
RDW: 14.4 % (ref 11.5–15.5)
WBC: 13.9 10*3/uL — ABNORMAL HIGH (ref 4.0–10.5)
nRBC: 0 % (ref 0.0–0.2)

## 2020-03-30 LAB — LACTIC ACID, PLASMA: Lactic Acid, Venous: 1.4 mmol/L (ref 0.5–1.9)

## 2020-03-30 LAB — GLUCOSE, CAPILLARY: Glucose-Capillary: 294 mg/dL — ABNORMAL HIGH (ref 70–99)

## 2020-03-30 LAB — CK: Total CK: 45 U/L (ref 38–234)

## 2020-03-30 MED ORDER — PREGABALIN 50 MG PO CAPS
50.0000 mg | ORAL_CAPSULE | Freq: Two times a day (BID) | ORAL | Status: DC
  Administered 2020-03-30 – 2020-03-31 (×2): 50 mg via ORAL
  Filled 2020-03-30 (×2): qty 1

## 2020-03-30 MED ORDER — ONDANSETRON HCL 4 MG/2ML IJ SOLN: 4.0000 mg | Freq: Four times a day (QID) | INTRAMUSCULAR | Status: DC | PRN

## 2020-03-30 MED ORDER — SODIUM CHLORIDE 0.9 % IV SOLN: 1.0000 g | INTRAVENOUS | Status: DC

## 2020-03-30 MED ORDER — ACETAMINOPHEN 650 MG RE SUPP: 650.0000 mg | Freq: Four times a day (QID) | RECTAL | Status: DC | PRN

## 2020-03-30 MED ORDER — PANTOPRAZOLE SODIUM 40 MG PO TBEC
40.0000 mg | DELAYED_RELEASE_TABLET | Freq: Every day | ORAL | Status: DC
  Administered 2020-03-30 – 2020-04-08 (×8): 40 mg via ORAL
  Filled 2020-03-30 (×9): qty 1

## 2020-03-30 MED ORDER — CLOPIDOGREL BISULFATE 75 MG PO TABS
75.0000 mg | ORAL_TABLET | Freq: Every day | ORAL | Status: DC
  Administered 2020-03-31 – 2020-04-08 (×7): 75 mg via ORAL
  Filled 2020-03-30 (×7): qty 1

## 2020-03-30 MED ORDER — IPRATROPIUM-ALBUTEROL 0.5-2.5 (3) MG/3ML IN SOLN
3.0000 mL | Freq: Two times a day (BID) | RESPIRATORY_TRACT | Status: DC
  Administered 2020-03-30 – 2020-04-01 (×5): 3 mL via RESPIRATORY_TRACT
  Filled 2020-03-30 (×5): qty 3

## 2020-03-30 MED ORDER — AMLODIPINE BESYLATE 10 MG PO TABS
10.0000 mg | ORAL_TABLET | Freq: Every day | ORAL | Status: DC
  Administered 2020-03-31 – 2020-04-08 (×7): 10 mg via ORAL
  Filled 2020-03-30: qty 1
  Filled 2020-03-30: qty 2
  Filled 2020-03-30 (×3): qty 1
  Filled 2020-03-30: qty 2
  Filled 2020-03-30: qty 1
  Filled 2020-03-30: qty 2

## 2020-03-30 MED ORDER — ONDANSETRON HCL 4 MG PO TABS
4.0000 mg | ORAL_TABLET | Freq: Four times a day (QID) | ORAL | Status: DC | PRN
  Administered 2020-04-05: 4 mg via ORAL
  Filled 2020-03-30: qty 1

## 2020-03-30 MED ORDER — ACETAMINOPHEN 325 MG PO TABS
650.0000 mg | ORAL_TABLET | Freq: Four times a day (QID) | ORAL | Status: DC | PRN
  Administered 2020-03-31 – 2020-04-07 (×10): 650 mg via ORAL
  Filled 2020-03-30 (×10): qty 2

## 2020-03-30 MED ORDER — HYDRALAZINE HCL 25 MG PO TABS
25.0000 mg | ORAL_TABLET | Freq: Three times a day (TID) | ORAL | Status: DC
  Administered 2020-03-30 – 2020-04-08 (×23): 25 mg via ORAL
  Filled 2020-03-30 (×26): qty 1

## 2020-03-30 MED ORDER — VANCOMYCIN HCL 500 MG/100ML IV SOLN
500.0000 mg | Freq: Two times a day (BID) | INTRAVENOUS | Status: DC
  Administered 2020-03-31 – 2020-04-02 (×5): 500 mg via INTRAVENOUS
  Filled 2020-03-30 (×7): qty 100

## 2020-03-30 MED ORDER — ALBUTEROL SULFATE (2.5 MG/3ML) 0.083% IN NEBU: 3.0000 mL | INHALATION_SOLUTION | Freq: Four times a day (QID) | RESPIRATORY_TRACT | Status: DC | PRN

## 2020-03-30 MED ORDER — TORSEMIDE 20 MG PO TABS
20.0000 mg | ORAL_TABLET | Freq: Two times a day (BID) | ORAL | Status: DC
  Administered 2020-03-30 – 2020-04-08 (×16): 20 mg via ORAL
  Filled 2020-03-30 (×19): qty 1

## 2020-03-30 MED ORDER — SODIUM CHLORIDE 0.9 % IV SOLN: INTRAVENOUS | Status: DC

## 2020-03-30 MED ORDER — CARVEDILOL 3.125 MG PO TABS
6.2500 mg | ORAL_TABLET | Freq: Two times a day (BID) | ORAL | Status: DC
  Administered 2020-03-30 – 2020-04-08 (×16): 6.25 mg via ORAL
  Filled 2020-03-30 (×18): qty 2

## 2020-03-30 MED ORDER — INSULIN ASPART 100 UNIT/ML ~~LOC~~ SOLN
0.0000 [IU] | Freq: Three times a day (TID) | SUBCUTANEOUS | Status: DC
  Administered 2020-03-31 (×2): 5 [IU] via SUBCUTANEOUS
  Administered 2020-03-31: 8 [IU] via SUBCUTANEOUS
  Administered 2020-04-01 (×3): 2 [IU] via SUBCUTANEOUS
  Administered 2020-04-02: 3 [IU] via SUBCUTANEOUS
  Administered 2020-04-02 (×2): 2 [IU] via SUBCUTANEOUS
  Administered 2020-04-03: 7 [IU] via SUBCUTANEOUS
  Administered 2020-04-04: 3 [IU] via SUBCUTANEOUS
  Administered 2020-04-04: 2 [IU] via SUBCUTANEOUS
  Administered 2020-04-04: 5 [IU] via SUBCUTANEOUS
  Administered 2020-04-05 (×2): 3 [IU] via SUBCUTANEOUS
  Administered 2020-04-05: 2 [IU] via SUBCUTANEOUS
  Administered 2020-04-06: 3 [IU] via SUBCUTANEOUS
  Administered 2020-04-07: 2 [IU] via SUBCUTANEOUS
  Administered 2020-04-07 (×2): 3 [IU] via SUBCUTANEOUS
  Administered 2020-04-08: 5 [IU] via SUBCUTANEOUS
  Filled 2020-03-30 (×20): qty 1

## 2020-03-30 MED ORDER — HYDRALAZINE HCL 20 MG/ML IJ SOLN
10.0000 mg | INTRAMUSCULAR | Status: DC | PRN
  Administered 2020-04-03: 10 mg via INTRAVENOUS
  Filled 2020-03-30: qty 1

## 2020-03-30 MED ORDER — INSULIN DETEMIR 100 UNIT/ML ~~LOC~~ SOLN
10.0000 [IU] | Freq: Every day | SUBCUTANEOUS | Status: DC
  Administered 2020-03-30 – 2020-04-07 (×8): 10 [IU] via SUBCUTANEOUS
  Filled 2020-03-30 (×11): qty 0.1

## 2020-03-30 MED ORDER — INSULIN ASPART 100 UNIT/ML ~~LOC~~ SOLN
0.0000 [IU] | Freq: Every day | SUBCUTANEOUS | Status: DC
  Administered 2020-03-30: 3 [IU] via SUBCUTANEOUS
  Administered 2020-04-04: 2 [IU] via SUBCUTANEOUS
  Filled 2020-03-30 (×2): qty 1

## 2020-03-30 MED ORDER — ENOXAPARIN SODIUM 40 MG/0.4ML ~~LOC~~ SOLN
40.0000 mg | SUBCUTANEOUS | Status: DC
  Administered 2020-03-30 – 2020-04-07 (×7): 40 mg via SUBCUTANEOUS
  Filled 2020-03-30 (×9): qty 0.4

## 2020-03-30 MED ORDER — GABAPENTIN 300 MG PO CAPS
300.0000 mg | ORAL_CAPSULE | Freq: Every evening | ORAL | Status: DC
  Administered 2020-03-30 – 2020-04-07 (×9): 300 mg via ORAL
  Filled 2020-03-30 (×10): qty 1

## 2020-03-30 MED ORDER — DOCUSATE SODIUM 100 MG PO CAPS
100.0000 mg | ORAL_CAPSULE | Freq: Two times a day (BID) | ORAL | Status: DC
  Administered 2020-03-30 – 2020-04-08 (×16): 100 mg via ORAL
  Filled 2020-03-30 (×17): qty 1

## 2020-03-30 MED ORDER — CLINDAMYCIN PHOSPHATE 600 MG/50ML IV SOLN
600.0000 mg | Freq: Three times a day (TID) | INTRAVENOUS | Status: DC
  Administered 2020-03-30 – 2020-03-31 (×2): 600 mg via INTRAVENOUS
  Filled 2020-03-30 (×5): qty 50

## 2020-03-30 MED ORDER — LORATADINE 10 MG PO TABS
10.0000 mg | ORAL_TABLET | Freq: Every day | ORAL | Status: DC
  Administered 2020-03-31 – 2020-04-08 (×7): 10 mg via ORAL
  Filled 2020-03-30 (×7): qty 1

## 2020-03-30 MED ORDER — ISOSORBIDE DINITRATE 30 MG PO TABS
30.0000 mg | ORAL_TABLET | Freq: Every day | ORAL | Status: DC
  Administered 2020-03-30 – 2020-04-08 (×7): 30 mg via ORAL
  Filled 2020-03-30 (×10): qty 1

## 2020-03-30 MED ORDER — BUDESONIDE 0.25 MG/2ML IN SUSP
0.2500 mg | Freq: Two times a day (BID) | RESPIRATORY_TRACT | Status: DC
  Administered 2020-03-30 – 2020-04-05 (×12): 0.25 mg via RESPIRATORY_TRACT
  Filled 2020-03-30 (×12): qty 2

## 2020-03-30 MED ORDER — FOLIC ACID 1 MG PO TABS
1.0000 mg | ORAL_TABLET | Freq: Every day | ORAL | Status: DC
  Administered 2020-03-30 – 2020-04-08 (×8): 1 mg via ORAL
  Filled 2020-03-30 (×8): qty 1

## 2020-03-30 MED ORDER — TRAZODONE HCL 50 MG PO TABS
25.0000 mg | ORAL_TABLET | Freq: Every evening | ORAL | Status: DC | PRN
  Administered 2020-03-30 – 2020-04-07 (×5): 25 mg via ORAL
  Filled 2020-03-30 (×5): qty 1

## 2020-03-30 MED ORDER — CLINDAMYCIN PHOSPHATE 600 MG/50ML IV SOLN
600.0000 mg | Freq: Three times a day (TID) | INTRAVENOUS | Status: DC
  Administered 2020-03-30: 600 mg via INTRAVENOUS

## 2020-03-30 MED ORDER — MAGNESIUM OXIDE 400 (241.3 MG) MG PO TABS
400.0000 mg | ORAL_TABLET | Freq: Two times a day (BID) | ORAL | Status: DC
  Administered 2020-03-30 – 2020-04-08 (×16): 400 mg via ORAL
  Filled 2020-03-30 (×16): qty 1

## 2020-03-30 MED ORDER — VANCOMYCIN HCL IN DEXTROSE 1-5 GM/200ML-% IV SOLN
1000.0000 mg | Freq: Once | INTRAVENOUS | Status: AC
  Administered 2020-03-30 (×2): 1000 mg via INTRAVENOUS
  Filled 2020-03-30: qty 200

## 2020-03-30 MED ORDER — MODAFINIL 100 MG PO TABS
200.0000 mg | ORAL_TABLET | Freq: Every day | ORAL | Status: DC
  Administered 2020-04-02 – 2020-04-08 (×5): 200 mg via ORAL
  Filled 2020-03-30 (×6): qty 2

## 2020-03-30 NOTE — ED Provider Notes (Signed)
[facility] Emergency Department Provider Note   ____________________________________________   First MD Initiated Contact with Patient 03/30/20 1542     (approximate)  I have reviewed the triage vital signs and the nursing notes.   HISTORY  Chief Complaint Toe Pain   HPI [name] is a 79 y.o. female who comes to the emergency room after an office visit with vascular surgery.  Her left I believe it is her second toe is black.  There is also some discolored skin on the tip of the second toe of the other foot      Past Medical History:  Diagnosis Date  . Acute heart failure ([place])   . Aortic stenosis   . CHF (congestive heart failure) ([place])   . Complication of anesthesia    Hard to wake patient up after having anesthesia  . Diabetes mellitus without complication ([place])   . Diabetic neuropathy (HCC)    Takes Lyrica  . Edema of both legs    Takes Lasix  . GERD (gastroesophageal reflux disease)   . High cholesterol   . HTN (hypertension)   . Hypertension   . PAD (peripheral artery disease) ([place])   . Shortness of breath dyspnea   . Spasm of back muscles   . Stroke ([place])   . Wound, open    Right great toe    Patient Active Problem List   Diagnosis Date Noted  . Palliative care encounter   . Gangrene of toe of both feet ([place]) 06/06/2019  . Pressure injury of skin 06/04/2019  . Recurrent UTI 06/02/2019  . UTI (urinary tract infection) 06/02/2019  . PAD (peripheral artery disease) ([place]) 04/27/2019  . ESBL (extended spectrum beta-lactamase) producing bacteria infection 12/25/2018  . Healthcare-associated pneumonia 06/03/2018  . Acute cystitis 04/10/2018  . Sepsis due to Streptococcus, group B ([place]) 09/12/2016  . Multiple open wounds of lower leg 09/12/2016  . PVD (peripheral vascular disease) ([place]) 09/12/2016  . Bacteremia 08/31/2016  . Ulcer of right lower extremity ([place]) 08/29/2016  . Sepsis ([place]) 08/29/2016  . Acute on chronic  diastolic heart failure ([place]) 07/08/2016  . HTN (hypertension) 07/05/2016  . Chest pain 07/05/2016  . Impacted cerumen of both ears 06/05/2016  . 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 Chronic diastolic CHF (congestive heart failure) ([place]) 03/10/2016  . HLD (hyperlipidemia) 03/09/2016  . Pleural effusion, bilateral 03/09/2016  . Wheeze 03/09/2016  . Dyspnea 03/08/2016  . Right sided weakness 03/05/2016  . Acute respiratory acidosis 03/05/2016  . Surgery, elective 03/04/2016  . Anemia, iron deficiency 03/03/2016  . Leukocytosis 03/02/2016  . Cerebrovascular accident (CVA) due to thrombosis of precerebral artery ([place]) 03/01/2016  . 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 Atherosclerosis of native arteries of the extremities with gangrene ([place]) 12/08/2015  . Open toe wound 05/04/2014  . Anemia, chronic disease 04/27/2014  . Occult blood positive stool 04/17/2014  . Edema 04/03/2014  . Unspecified hereditary and idiopathic peripheral neuropathy 03/14/2014  . DM2 (diabetes mellitus, type 2) ([place]) 02/14/2014  . Altered mental status 02/12/2014  . Acute encephalopathy 02/12/2014  . Aortic stenosis 02/04/2014  . Closed left subtrochanteric femur fracture ([place]) 02/03/2014  . Hip fracture, left ([place]) 02/03/2014  . Hypokalemia 02/03/2014  . Fibula fracture 02/03/2014  . MTP instability 02/03/2014  . Hip fracture ([place]) 02/03/2014    Past Surgical History:  Procedure Laterality Date  . AMPUTATION TOE Left 06/09/2019   Procedure: Left  third toe and partial great toe amputation and debridement;  Surgeon: Katha Cabal, MD;  Location: [facility];  Service: Vascular;  Laterality: Left;  . BYPASS GRAFT POPLITEAL TO TIBIAL Right 02/28/2016   Procedure: BYPASS GRAFT RIGHT BELOW KNEE POPLITEAL TO PERONEAL USING REVERSED RIGHT GREATER SAPHENOUS VEIN;  Surgeon: Conrad Coahoma, MD;  Location: [place];  Service: Vascular;  Laterality: Right;    . CYST EXCISION     Abdomen  . EYE SURGERY Bilateral    Cataract removal  . INTRAMEDULLARY (IM) NAIL INTERTROCHANTERIC Left 02/04/2014   Procedure: INTRAMEDULLARY (IM) NAIL INTERTROCHANTRIC FEMORAL;  Surgeon: Mauri Pole, MD;  Location: [place];  Service: Orthopedics;  Laterality: Left;  . IR GENERIC HISTORICAL  03/01/2016   IR ANGIO INTRA EXTRACRAN SEL COM CAROTID INNOMINATE UNI R MOD SED 03/01/2016 [name], MD [facility]  . IR GENERIC HISTORICAL  03/01/2016   IR ENDOVASC INTRACRANIAL INF OTHER THAN THROMBO ART INC DIAG ANGIO 03/01/2016 [name], MD [facility]  . IR GENERIC HISTORICAL  03/01/2016   IR INTRAVSC STENT CERV CAROTID W/O EMB-PROT MOD SED INC ANGIO 03/01/2016 [name], MD [facility]  . IR GENERIC HISTORICAL  06/03/2016   IR RADIOLOGIST EVAL & MGMT 06/03/2016 [facility]  . LOWER EXTREMITY ANGIOGRAPHY Left 02/09/2019   Procedure: LOWER EXTREMITY ANGIOGRAPHY;  Surgeon: Katha Cabal, MD;  Location: [place] [facility];  Service: Cardiovascular;  Laterality: Left;  . LOWER EXTREMITY ANGIOGRAPHY Left 03/10/2019   Procedure: LOWER EXTREMITY ANGIOGRAPHY;  Surgeon: Katha Cabal, MD;  Location: [place] [facility];  Service: Cardiovascular;  Laterality: Left;  . LOWER EXTREMITY ANGIOGRAPHY Left 04/27/2019   Procedure: LOWER EXTREMITY ANGIOGRAPHY;  Surgeon: Katha Cabal, MD;  Location: [place] [facility];  Service: Cardiovascular;  Laterality: Left;  . LOWER EXTREMITY ANGIOGRAPHY Left 06/08/2019   Procedure: Lower Extremity Angiography;  Surgeon: Katha Cabal, MD;  Location: [place] [facility];  Service: Cardiovascular;  Laterality: Left;  . ORIF TOE FRACTURE Right 02/08/2014   Procedure: OPEN REDUCTION INTERNAL FIXATION Right METATARSAL  FRACTURE ;  Surgeon: Wylene Simmer, MD;  Location: [place];  Service: Orthopedics;  Laterality: Right;  . PERIPHERAL VASCULAR CATHETERIZATION N/A 12/28/2015   Procedure: Abdominal Aortogram w/Lower  Extremity;  Surgeon: Conrad [place], MD;  Location: [place] [facility];  Service: Cardiovascular;  Laterality: N/A;  . RADIOLOGY WITH ANESTHESIA N/A 03/01/2016   Procedure: RADIOLOGY WITH ANESTHESIA;  Surgeon: [name], MD;  Location: [place];  Service: Radiology;  Laterality: N/A;  . VEIN HARVEST Right 02/28/2016   Procedure: RIGHT GREATER SAPHENOUS VEIN HARVEST;  Surgeon: Conrad Oradell, MD;  Location: [place];  Service: Vascular;  Laterality: Right;    Prior to Admission medications   Medication Sig Start Date End Date Taking? Authorizing Provider  acetaminophen (TYLENOL) 500 MG tablet Take 1,000 mg by mouth every 6 (six) hours as needed for mild pain.    [provider]  albuterol (PROVENTIL HFA;VENTOLIN HFA) 108 (90 Base) MCG/ACT inhaler Inhale 2 puffs into the lungs every 6 (six) hours as needed for wheezing or shortness of breath.     [provider]  amLODipine (NORVASC) 10 MG tablet Take 10 mg by mouth daily.    [provider]  aspirin EC 81 MG EC tablet Take 1 tablet (81 mg total) by mouth daily. 04/29/19   Stegmayer, Joelene Millin A, PA-C  atorvastatin (LIPITOR) 20 MG tablet Take 1 tablet (20 mg total) by mouth daily.  Patient taking differently: Take 20 mg by mouth at bedtime.  03/13/16   Virgina Jock A, PA-C  budesonide (PULMICORT) 0.25 MG/2ML nebulizer solution Take 2 mLs (0.25 mg total) by nebulization 2 (two) times daily. 06/10/18   Dustin Flock, MD  carvedilol (COREG) 6.25 MG tablet Take 6.25 mg by mouth 2 (two) times daily with a meal.    [provider]  cetirizine (ZYRTEC) 10 MG tablet Take 10 mg by mouth daily.     [provider]  Cholecalciferol (VITAMIN D) 50 MCG (2000 UT) CAPS Take 2,000 Units by mouth daily.     [provider]  clopidogrel (PLAVIX) 75 MG tablet Take 1 tablet (75 mg total) by mouth daily with breakfast. 03/04/17   Conrad Beckley, MD  docusate sodium (COLACE) 100 MG capsule Take 100 mg by mouth 2 (two) times  daily.    [provider]  doxycycline (VIBRAMYCIN) 100 MG capsule Take 1 capsule (100 mg total) by mouth 2 (two) times daily. 07/12/19   Kris Hartmann, NP  fluticasone (FLONASE) 50 MCG/ACT nasal spray Place 1 spray into both nostrils daily as needed for allergies.     [provider]  gabapentin (NEURONTIN) 300 MG capsule Take 300 mg by mouth every evening.  12/07/18   [provider]  hydrALAZINE (APRESOLINE) 25 MG tablet Take 1 tablet (25 mg total) by mouth every 8 (eight) hours. 12/28/18   Fritzi Mandes, MD  insulin glargine (LANTUS) 100 UNIT/ML injection Inject 0.15 mLs (15 Units total) into the skin at bedtime. 12/28/18   Fritzi Mandes, MD  ipratropium-albuterol (DUONEB) 0.5-2.5 (3) MG/3ML SOLN Take 3 mLs by nebulization 2 (two) times daily. 06/10/18   Dustin Flock, MD  isosorbide dinitrate (ISORDIL) 30 MG tablet Take 30 mg by mouth daily.    [provider]  magnesium oxide (MAG-OX) 400 MG tablet Take 400 mg by mouth 2 (two) times daily.     [provider]  modafinil (PROVIGIL) 200 MG tablet Take 200 mg by mouth daily.    [provider]  nitroGLYCERIN (NITROSTAT) 0.4 MG SL tablet Place 1 tablet (0.4 mg total) under the tongue every 5 (five) minutes as needed for chest pain. Patient not taking: Reported on 06/21/2019 07/09/16   Holley Raring, MD  NYSTATIN powder  08/03/19   [provider]  omeprazole (PRILOSEC) 20 MG capsule Take 1 capsule (20 mg total) by mouth daily. 04/17/18   Henreitta Leber, MD  OXYGEN Inhale 2 L into the lungs daily as needed (short of breath).    [provider]  polyethylene glycol (MIRALAX / GLYCOLAX) packet Take 17 g by mouth 2 (two) times daily. Patient taking differently: Take 17 g by mouth daily as needed for moderate constipation.  02/09/14   Thurnell Lose, MD  potassium chloride SA (K-DUR,KLOR-CON) 20 MEQ tablet Take 20 mEq by mouth daily.    [provider]  pregabalin (LYRICA) 50  MG capsule Take 50 mg by mouth 2 (two) times daily.    [provider]  torsemide (DEMADEX) 20 MG tablet Take 1 tablet (20 mg total) by mouth 2 (two) times daily. 12/28/18   Fritzi Mandes, MD  traMADol (ULTRAM) 50 MG tablet Take 1 tablet (50 mg total) by mouth every 6 (six) hours as needed for severe pain. 06/11/19   Fritzi Mandes, MD  Vitamin E 180 MG CAPS Take 180 mg by mouth daily.    [provider]    Allergies Iodine,  Penicillins, Shellfish allergy, Sulfa antibiotics, Sulfacetamide sodium, Sulfasalazine, Eggs or egg-derived products, and Morphine and related  Family History  Problem Relation Age of Onset  . Diabetes Other   . Liver disease Mother   . CVA Father   . Diabetes Father   . Hypertension Father     Social History Social History   Tobacco Use  . Smoking status: Never Smoker  . Smokeless tobacco: Never Used  . Tobacco comment: Never smoked  Substance Use Topics  . Alcohol use: No    Alcohol/week: 0.0 standard drinks  . Drug use: No    Review of Systems  Constitutional: No fever/chills Eyes: No visual changes. ENT: No sore throat. Cardiovascular: Denies chest pain. Respiratory: Denies shortness of breath. Gastrointestinal: No abdominal pain.  No nausea, no vomiting.  No diarrhea.  No constipation. Genitourinary: Negative for dysuria. Musculoskeletal: Negative for back pain. Skin: Negative for rash. Neurological: Negative for headaches, focal weakness   ____________________________________________   PHYSICAL EXAM:  VITAL SIGNS: ED Triage Vitals  Enc Vitals Group     BP 03/30/20 1049 (!) 142/57     Pulse Rate 03/30/20 1049 83     Resp 03/30/20 1049 17     Temp 03/30/20 1049 98.3 F (36.8 C)     Temp Source 03/30/20 1049 Oral     SpO2 03/30/20 1049 96 %     Weight --      Height --      Head Circumference --      Peak Flow --      Pain Score 03/30/20 1054 4     Pain Loc --      Pain Edu? --      Excl. in [place]? --      Constitutional: Alert and oriented. Well appearing and in no acute distress. Eyes: Conjunctivae are normal. PER Head: Atraumatic. Nose: No congestion/rhinnorhea. Mouth/Throat: Mucous membranes are moist.  Oropharynx non-erythematous. Neck: No stridor.  Cardiovascular: Normal rate, regular rhythm. Grossly normal heart sounds.  Good peripheral circulation. Respiratory: Normal respiratory effort.  No retractions. Lungs few crackles in the bases Gastrointestinal: Soft and nontender. No distention. No abdominal bruits. No CVA tenderness. Musculoskeletal: No lower extremity tenderness nor edema.  Toes and left forefoot as described in HPI Neurologic:  Normal speech and language.  Skin:  Skin is warm, dry and intact. No rash noted.   ____________________________________________   LABS (all labs ordered are listed, but only abnormal results are displayed)  Labs Reviewed  COMPREHENSIVE METABOLIC PANEL - Abnormal; Notable for the following components:      Result Value   Glucose, Bld 265 (*)    Albumin 3.2 (*)    All other components within normal limits  CBC WITH DIFFERENTIAL/PLATELET - Abnormal; Notable for the following components:   WBC 13.9 (*)    Neutro Abs 10.2 (*)    All other components within normal limits  CULTURE, BLOOD (ROUTINE X 2)  CULTURE, BLOOD (ROUTINE X 2)  SARS CORONAVIRUS 2 BY RT PCR (HOSPITAL ORDER, [place] [facility])  LACTIC ACID, PLASMA  LACTIC ACID, PLASMA  URINALYSIS, COMPLETE (UACMP) WITH MICROSCOPIC  PROTIME-INR  CK  SEDIMENTATION RATE   ____________________________________________  EKG   ____________________________________________  RADIOLOGY  ED MD interpretation: Chest x-ray read by radiology as cardiomegaly chronic changes no acute changes foot read by radiology as no definite osteomyelitis.  I reviewed both of these films and agree  Official radiology report(s): DG Chest 1 View  Result Date:  03/30/2020 CLINICAL  DATA:  Sepsis, toe infection EXAM: CHEST  1 VIEW COMPARISON:  12/23/2018 FINDINGS: Stable cardiomegaly. Chronic bilateral interstitial prominence. No focal airspace consolidation, pleural effusion, or pneumothorax. Bones appear demineralized. IMPRESSION: Cardiomegaly with chronic bilateral interstitial prominence. No focal airspace consolidation. Electronically Signed   By: Davina Poke D.O.   On: 03/30/2020 11:38   DG Foot Complete Left  Result Date: 03/30/2020 CLINICAL DATA:  Sepsis.  Second toe infection. EXAM: LEFT FOOT - COMPLETE 3+ VIEW COMPARISON:  None. FINDINGS: Diffuse osteopenia is noted. 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 Vascular calcifications are noted. No fracture or dislocation is noted. No definite lytic destruction is seen to suggest acute osteomyelitis. IMPRESSION: No definite lytic destruction is seen to suggest acute osteomyelitis. Electronically Signed   By: Marijo Conception M.D.   On: 03/30/2020 11:41    ____________________________________________   PROCEDURES  Procedure(s) performed (including Critical Care):  Procedures   ____________________________________________   INITIAL IMPRESSION / ASSESSMENT AND PLAN / ED COURSE  [place]ust patient with Dr. Vickki Muff with podiatry and with Dr. Lucky Cowboy vascular.  Dr. Lazaro Arms suggest antibiotics and get her admitted to the hospitalist they will both consult.  Patient apparently sent from PCP office for sepsis and toe infection.  Not sure about the sepsis there is no apparent fever.  She does have a white count lactic acid is normal she is not tachycardic or tachypneic or anything else.  We will give her antibiotics.  She is allergic to penicillin therefore after discussing her with Dr. Lucky Cowboy will use vancomycin and clindamycin.  Patient's toe was reported to be black for the last 3 or 4 days.  This is not enough time for osteomyelitis to to develop to the point where can be seen on x-ray.  She has a little bit of black  skin at the tip of the other second toe on the other foot as well.  There is a possibility that she may be having emboli flicked off causing her toes to be necrotic.  This we should evaluate for this however more likely is that she just has poor blood flow to the feet and is losing her toes little by little.  She has had previous toe amputations.              ____________________________________________   FINAL CLINICAL IMPRESSION(S) / ED DIAGNOSES  Final diagnoses:  Necrotic toes [facility])     ED Discharge Orders    None       Note:  This document was prepared using Dragon voice recognition software and may include unintentional dictation errors.    Nena Polio, MD 03/30/20 1600

## 2020-03-30 NOTE — ED Notes (Signed)
IV team at bedside 

## 2020-03-30 NOTE — ED Notes (Signed)
IV team consult placed unable to gain access

## 2020-03-30 NOTE — H&P (Signed)
History and Physical    [name] POE:[id number] DOB: [birth year] DOA: 03/30/2020  PCP: [name], MD  Patient coming from: Home  I have personally briefly reviewed patient's old medical records in [place]  Chief Complaint: Toe infection  HPI: [name] is a 79 y.o. female with medical history significant of diastolic congestive heart failure, diabetes mellitus, diabetic neuropathy, hypertension, hyperlipidemia, GERD, peripheral vascular disease, CVA presents to the ED at the request of her primary care physician for evaluation of a black necrotic left second toe with presumed cellulitis.  On my evaluation the patient and her husband at are in the room.  They are poor historians.  The majority the history was gained by reading the medical record and speaking with the ED physician  Last documentation in care everywhere was from 2019.  No updated documentation but apparently she is known to [facility].  It is unclear exactly how long her symptoms been going on or how long her toes look black.  Her story is rather inconsistent.  In the emergency department she was given 1 dose of vancomycin and clindamycin because of numerous antibiotic allergies/intolerances.  Hospitalist contacted for admission.  Apparently the toes been black for the last 3 to 4 days.  No osteomyelitis was noted on imaging.   ED Course: Patient was given 1 dose of vancomycin, 1 dose clindamycin.  Blood cultures were taken on admission although the patient does not appear clinically septic.  She does have a minimal leukocytosis but is afebrile with good blood pressure and heart rate.  Dr. Lucky Cowboy from vascular surgery and Dr. Vickki Muff from podiatry both contacted by ED provider.  Recommended admission for IV antibiotics.  Review of Systems: As per HPI otherwise 14 point review of systems negative.    Past Medical History:  Diagnosis Date  . Acute heart failure ([place])   . Aortic stenosis   . CHF  (congestive heart failure) ([place])   . Complication of anesthesia    Hard to wake patient up after having anesthesia  . Diabetes mellitus without complication ([place])   . Diabetic neuropathy ([facility])    Takes Lyrica  . Edema of both legs    Takes Lasix  . GERD (gastroesophageal reflux disease)   . High cholesterol   . HTN (hypertension)   . Hypertension   . PAD (peripheral artery disease) ([place])   . Shortness of breath dyspnea   . Spasm of back muscles   . Stroke ([place])   . Wound, open    Right great toe    Past Surgical History:  Procedure Laterality Date  . AMPUTATION TOE Left 06/09/2019   Procedure: Left third toe and partial great toe amputation and debridement;  Surgeon: Katha Cabal, MD;  Location: [facility];  Service: Vascular;  Laterality: Left;  . BYPASS GRAFT POPLITEAL TO TIBIAL Right 02/28/2016   Procedure: BYPASS GRAFT RIGHT BELOW KNEE POPLITEAL TO PERONEAL USING REVERSED RIGHT GREATER SAPHENOUS VEIN;  Surgeon: Conrad Mamou, MD;  Location: [facility];  Service: Vascular;  Laterality: Right;  . CYST EXCISION     Abdomen  . EYE SURGERY Bilateral    Cataract removal  . INTRAMEDULLARY (IM) NAIL INTERTROCHANTERIC Left 02/04/2014   Procedure: INTRAMEDULLARY (IM) NAIL INTERTROCHANTRIC FEMORAL;  Surgeon: Mauri Pole, MD;  Location: [facility];  Service: Orthopedics;  Laterality: Left;  . IR GENERIC HISTORICAL  03/01/2016   IR ANGIO INTRA EXTRACRAN SEL COM CAROTID INNOMINATE UNI R MOD  SED 03/01/2016 [name], MD [facility]  . IR GENERIC HISTORICAL  03/01/2016   IR ENDOVASC INTRACRANIAL INF OTHER THAN THROMBO ART INC DIAG ANGIO 03/01/2016 [name], MD [facility]  . IR GENERIC HISTORICAL  03/01/2016   IR INTRAVSC STENT CERV CAROTID W/O EMB-PROT MOD SED INC ANGIO 03/01/2016 [name], MD [facility]  . IR GENERIC HISTORICAL  06/03/2016   IR RADIOLOGIST EVAL & MGMT 06/03/2016 [facility]  . LOWER EXTREMITY ANGIOGRAPHY Left 02/09/2019   Procedure: LOWER EXTREMITY  ANGIOGRAPHY;  Surgeon: Katha Cabal, MD;  Location: [facility];  Service: Cardiovascular;  Laterality: Left;  . LOWER EXTREMITY ANGIOGRAPHY Left 03/10/2019   Procedure: LOWER EXTREMITY ANGIOGRAPHY;  Surgeon: Katha Cabal, MD;  Location: [facility];  Service: Cardiovascular;  Laterality: Left;  . LOWER EXTREMITY ANGIOGRAPHY Left 04/27/2019   Procedure: LOWER EXTREMITY ANGIOGRAPHY;  Surgeon: Katha Cabal, MD;  Location: [facility];  Service: Cardiovascular;  Laterality: Left;  . LOWER EXTREMITY ANGIOGRAPHY Left 06/08/2019   Procedure: Lower Extremity Angiography;  Surgeon: Katha Cabal, MD;  Location: [facility];  Service: Cardiovascular;  Laterality: Left;  . ORIF TOE FRACTURE Right 02/08/2014   Procedure: OPEN REDUCTION INTERNAL FIXATION Right METATARSAL  FRACTURE ;  Surgeon: Wylene Simmer, MD;  Location: [facility];  Service: Orthopedics;  Laterality: Right;  . PERIPHERAL VASCULAR CATHETERIZATION N/A 12/28/2015   Procedure: Abdominal Aortogram w/Lower Extremity;  Surgeon: Conrad [place], MD;  Location: [facility];  Service: Cardiovascular;  Laterality: N/A;  . RADIOLOGY WITH ANESTHESIA N/A 03/01/2016   Procedure: RADIOLOGY WITH ANESTHESIA;  Surgeon: [name], MD;  Location: [facility];  Service: Radiology;  Laterality: N/A;  . VEIN HARVEST Right 02/28/2016   Procedure: RIGHT GREATER SAPHENOUS VEIN HARVEST;  Surgeon: Conrad Fruitland, MD;  Location: [name];  Service: Vascular;  Laterality: Right;     reports that she has never smoked. She has never used smokeless tobacco. She reports that she does not drink alcohol and does not use drugs.  Allergies  Allergen Reactions  . Iodine Anaphylaxis  . Penicillins Anaphylaxis    Tolerates ceftriaxone, cefazolin Did it involve swelling of the face/tongue/throat, SOB, or low BP? Unknown Did it involve sudden or severe rash/hives, skin peeling, or any reaction on the inside of your mouth or nose?  Unknown Did you need to seek medical attention at a hospital or doctor's office? Unknown When did it last happen?Unknown If all above answers are "NO", may proceed with cephalosporin use.   . Shellfish Allergy Anaphylaxis  . Sulfa Antibiotics Anaphylaxis  . Sulfacetamide Sodium Anaphylaxis  . Sulfasalazine Anaphylaxis  . Eggs Or Egg-Derived Products     Other reaction(s): SHORTNESS OF BREATH  . Morphine And Related Other (See Comments)    Altered mental status    Family History  Problem Relation Age of Onset  . Diabetes Other   . Liver disease Mother   . CVA Father   . Diabetes Father   . Hypertension Father      Prior to Admission medications   Medication Sig Start Date End Date Taking? Authorizing Provider  acetaminophen (TYLENOL) 500 MG tablet Take 1,000 mg by mouth every 6 (six) hours as needed for mild pain.    [provider]  albuterol (PROVENTIL HFA;VENTOLIN HFA) 108 (90 Base) MCG/ACT inhaler Inhale 2 puffs into the lungs every 6 (six) hours as needed for wheezing or shortness of breath.  [provider]  amLODipine (NORVASC) 10 MG tablet Take 10 mg by mouth daily.    [provider]  aspirin EC 81 MG EC tablet Take 1 tablet (81 mg total) by mouth daily. 04/29/19   Stegmayer, Joelene Millin A, PA-C  atorvastatin (LIPITOR) 20 MG tablet Take 1 tablet (20 mg total) by mouth daily. Patient taking differently: Take 20 mg by mouth at bedtime.  03/13/16   Virgina Jock A, PA-C  budesonide (PULMICORT) 0.25 MG/2ML nebulizer solution Take 2 mLs (0.25 mg total) by nebulization 2 (two) times daily. 06/10/18   Dustin Flock, MD  carvedilol (COREG) 6.25 MG tablet Take 6.25 mg by mouth 2 (two) times daily with a meal.    [provider]  cetirizine (ZYRTEC) 10 MG tablet Take 10 mg by mouth daily.     [provider]  Cholecalciferol (VITAMIN D) 50 MCG (2000 UT) CAPS Take 2,000 Units by mouth daily.     [provider]  clopidogrel  (PLAVIX) 75 MG tablet Take 1 tablet (75 mg total) by mouth daily with breakfast. 03/04/17   Conrad Georgetown, MD  docusate sodium (COLACE) 100 MG capsule Take 100 mg by mouth 2 (two) times daily.    [provider]  doxycycline (VIBRAMYCIN) 100 MG capsule Take 1 capsule (100 mg total) by mouth 2 (two) times daily. 07/12/19   Kris Hartmann, NP  fluticasone (FLONASE) 50 MCG/ACT nasal spray Place 1 spray into both nostrils daily as needed for allergies.     [provider]  gabapentin (NEURONTIN) 300 MG capsule Take 300 mg by mouth every evening.  12/07/18   [provider]  hydrALAZINE (APRESOLINE) 25 MG tablet Take 1 tablet (25 mg total) by mouth every 8 (eight) hours. 12/28/18   Fritzi Mandes, MD  insulin glargine (LANTUS) 100 UNIT/ML injection Inject 0.15 mLs (15 Units total) into the skin at bedtime. 12/28/18   Fritzi Mandes, MD  ipratropium-albuterol (DUONEB) 0.5-2.5 (3) MG/3ML SOLN Take 3 mLs by nebulization 2 (two) times daily. 06/10/18   Dustin Flock, MD  isosorbide dinitrate (ISORDIL) 30 MG tablet Take 30 mg by mouth daily.    [provider]  magnesium oxide (MAG-OX) 400 MG tablet Take 400 mg by mouth 2 (two) times daily.     [provider]  modafinil (PROVIGIL) 200 MG tablet Take 200 mg by mouth daily.    [provider]  nitroGLYCERIN (NITROSTAT) 0.4 MG SL tablet Place 1 tablet (0.4 mg total) under the tongue every 5 (five) minutes as needed for chest pain. Patient not taking: Reported on 06/21/2019 07/09/16   Holley Raring, MD  NYSTATIN powder  08/03/19   [provider]  omeprazole (PRILOSEC) 20 MG capsule Take 1 capsule (20 mg total) by mouth daily. 04/17/18   Henreitta Leber, MD  OXYGEN Inhale 2 L into the lungs daily as needed (short of breath).    [provider]  polyethylene glycol (MIRALAX / GLYCOLAX) packet Take 17 g by mouth 2 (two) times daily. Patient taking differently: Take 17 g by mouth daily as needed for  moderate constipation.  02/09/14   Thurnell Lose, MD  potassium chloride SA (K-DUR,KLOR-CON) 20 MEQ tablet Take 20 mEq by mouth daily.    [provider]  pregabalin (LYRICA) 50 MG capsule Take 50 mg by mouth 2 (two) times daily.    [provider]  torsemide (DEMADEX) 20 MG tablet Take 1 tablet (20 mg total) by mouth 2 (two) times  daily. 12/28/18   Fritzi Mandes, MD  traMADol (ULTRAM) 50 MG tablet Take 1 tablet (50 mg total) by mouth every 6 (six) hours as needed for severe pain. 06/11/19   Fritzi Mandes, MD  Vitamin E 180 MG CAPS Take 180 mg by mouth daily.    [provider]    Physical Exam: Vitals:   03/30/20 1049  BP: (!) 142/57  Pulse: 83  Resp: 17  Temp: 98.3 F (36.8 C)  TempSrc: Oral  SpO2: 96%     Vitals:   03/30/20 1049  BP: (!) 142/57  Pulse: 83  Resp: 17  Temp: 98.3 F (36.8 C)  TempSrc: Oral  SpO2: 96%   Constitutional: NAD, calm, comfortable Eyes: PERRL, lids and conjunctivae normal ENMT: Mucous membranes are dry. Posterior pharynx clear of any exudate or lesions.poor dentition.  Neck: normal, supple, no masses, no thyromegaly Respiratory: Lung sounds diffusely decreased.  No wheezing.  Normal work of breathing.  No oxygen requirement Cardiovascular: Regular rate and rhythm.  3/6 systolic murmur.  Decreased pedal pulses bilaterally.  Sensation lower extremities decreased Abdomen: no tenderness, no masses palpated. No hepatosplenomegaly. Bowel sounds positive.  Musculoskeletal: Left second toe black and necrotic.  Some drainage, non-foul-smelling.  Left hallux status post amputation.  Right lower extremity splotchy ecchymoses. Skin: Scattered ecchymoses and excoriations in various stages of healing Neurologic: Cranial nerves grossly intact.  Sensation decreased bilateral lower extremities.   Psychiatric: Limited judgment.  Poor insight into disease process.  Alert, oriented x3.  Normal affect   Labs on Admission: I have personally  reviewed following labs and imaging studies  CBC: Recent Labs  Lab 03/30/20 1058  WBC 13.9*  NEUTROABS 10.2*  HGB 12.0  HCT 37.6  MCV 83.6  PLT 209   Basic Metabolic Panel: Recent Labs  Lab 03/30/20 1058  NA 136  K 4.3  CL 99  CO2 30  GLUCOSE 265*  BUN 13  CREATININE 0.54  CALCIUM 9.3   GFR: CrCl cannot be calculated (Unknown ideal weight.). Liver Function Tests: Recent Labs  Lab 03/30/20 1058  AST 17  ALT 13  ALKPHOS 69  BILITOT 0.6  PROT 6.6  ALBUMIN 3.2*   No results for input(s): LIPASE, AMYLASE in the last 168 hours. No results for input(s): AMMONIA in the last 168 hours. Coagulation Profile: No results for input(s): INR, PROTIME in the last 168 hours. Cardiac Enzymes: No results for input(s): CKTOTAL, CKMB, CKMBINDEX, TROPONINI in the last 168 hours. BNP (last 3 results) No results for input(s): PROBNP in the last 8760 hours. HbA1C: No results for input(s): HGBA1C in the last 72 hours. CBG: No results for input(s): GLUCAP in the last 168 hours. Lipid Profile: No results for input(s): CHOL, HDL, LDLCALC, TRIG, CHOLHDL, LDLDIRECT in the last 72 hours. Thyroid Function Tests: No results for input(s): TSH, T4TOTAL, FREET4, T3FREE, THYROIDAB in the last 72 hours. Anemia Panel: No results for input(s): VITAMINB12, FOLATE, FERRITIN, TIBC, IRON, RETICCTPCT in the last 72 hours. Urine analysis:    Component Value Date/Time   COLORURINE YELLOW (A) 06/02/2019 1835   APPEARANCEUR TURBID (A) 06/02/2019 1835   LABSPEC 1.011 06/02/2019 1835   PHURINE 7.0 06/02/2019 1835   GLUCOSEU NEGATIVE 06/02/2019 1835   HGBUR NEGATIVE 06/02/2019 [place] 06/02/2019 [place] 06/02/2019 [place] 30 (A) 06/02/2019 1835   UROBILINOGEN 0.2 02/12/2014 0110   NITRITE NEGATIVE 06/02/2019 1835   LEUKOCYTESUR LARGE (A) 06/02/2019 1835    Radiological  Exams on Admission: DG Chest 1 View  Result Date: 03/30/2020 CLINICAL DATA:   Sepsis, toe infection EXAM: CHEST  1 VIEW COMPARISON:  12/23/2018 FINDINGS: Stable cardiomegaly. Chronic bilateral interstitial prominence. No focal airspace consolidation, pleural effusion, or pneumothorax. Bones appear demineralized. IMPRESSION: Cardiomegaly with chronic bilateral interstitial prominence. No focal airspace consolidation. Electronically Signed   By: Davina Poke D.O.   On: 03/30/2020 11:38   DG Foot Complete Left  Result Date: 03/30/2020 CLINICAL DATA:  Sepsis.  Second toe infection. EXAM: LEFT FOOT - COMPLETE 3+ VIEW COMPARISON:  None. FINDINGS: Diffuse osteopenia is noted. 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 Vascular calcifications are noted. No fracture or dislocation is noted. No definite lytic destruction is seen to suggest acute osteomyelitis. IMPRESSION: No definite lytic destruction is seen to suggest acute osteomyelitis. Electronically Signed   By: Marijo Conception M.D.   On: 03/30/2020 11:41     Assessment/Plan Active Problems:   Infected wound  Necrotic left second toe with superimposed cellulitis Exact timeframe is unknown Patient states ~been black for 3 to 4 days, I suspect longer Sensation decreased Patient is known to podiatry as well as [facility] No osteomyelitis noted on plain film imaging Clinically not septic by criteria Plan: Admit inpatient, MedSurg Empiric vancomycin IV, pharmacy dosing Clindamycin 600 mg IV every 8 hours Submental IVF Follow cultures taken on admission As needed pain control W OC consult Vascular consult, communicated with Dr. Lucky Cowboy Podiatry consult, communicated with Dr. Vickki Muff Suspect patient will require surgical intervention/amputation Appreciate surgical recommendations  Chronic diastolic congestive heart failure Does not appear acutely exacerbated Continue goal-directed medical therapy Continue Coreg 6.25 twice daily Continue Demadex 20 twice daily No lisinopril noted on  home med rec  Peripheral vascular disease History of CVA Continue dual antiplatelet therapy with aspirin and Plavix  Essential hypertension Continue Coreg 6.25 twice daily Continue amlodipine 10 mg daily Continue hydralazine 25 mg p.o. every 8 hours As needed hydralazine  Insulin-dependent diabetes mellitus Diabetic neuropathy Home Lantus dose 18 units daily at bedtime Plan: Start Lantus 18 units nightly Moderate sliding scale Carb modified diet Check A1c Continue gabapentin/pregabalin Consider diabetes coordinator consult if glycemic control becomes difficult  GERD PPI  COPD Appears baseline Not acutely exacerbated Continue home nebulizer/inhaler regimen     DVT prophylaxis: Lovenox Code Status: Full Family Communication: Husband at bedside Disposition Plan: Anticipate return to previous home environment Consults called: Podiatry: Vickki Muff; vascular: Dew Admission status: Inpatient, MedSurg   Sidney Ace MD [facility]   If 7PM-7AM, please contact night-coverage   03/30/2020, 4:08 PM

## 2020-03-30 NOTE — Progress Notes (Signed)
Patient arrived from ED with RN.

## 2020-03-30 NOTE — ED Notes (Signed)
Pt had large BM. Pt cleaned up and clean brief placed. Linens changed. duoderm placed on sacrum with barrier cream

## 2020-03-30 NOTE — Progress Notes (Signed)
Pharmacy Antibiotic Note  [name] is a 79 y.o. female admitted on 03/30/2020 with cellulitis.  Pharmacy has been consulted for Vancomycin dosing.  Patient received vancomycin 1g IV x 1 dose in the ED.   Plan: Will start vancomycin 500mg  IV every 12 hours.  Pharmacy will continue to monitor and adjust dose as needed.   Height: 5\' 5"  (165.1 cm) Weight: 66.2 kg (146 lb) IBW/kg (Calculated) : 57  Temp (24hrs), Avg:98.3 F (36.8 C), Min:98.3 F (36.8 C), Max:98.3 F (36.8 C)  Recent Labs  Lab 03/30/20 1058  WBC 13.9*  CREATININE 0.54  LATICACIDVEN 1.4    Estimated Creatinine Clearance: 53 mL/min (by C-G formula based on SCr of 0.54 mg/dL).    Allergies  Allergen Reactions  . Iodine Anaphylaxis  . Penicillins Anaphylaxis    Tolerates ceftriaxone, cefazolin Did it involve swelling of the face/tongue/throat, SOB, or low BP? Unknown Did it involve sudden or severe rash/hives, skin peeling, or any reaction on the inside of your mouth or nose? Unknown Did you need to seek medical attention at a hospital or doctor's office? Unknown When did it last happen?Unknown If all above answers are "NO", may proceed with cephalosporin use.   . Shellfish Allergy Anaphylaxis  . Sulfa Antibiotics Anaphylaxis  . Sulfacetamide Sodium Anaphylaxis  . Sulfasalazine Anaphylaxis  . Eggs Or Egg-Derived Products     Other reaction(s): SHORTNESS OF BREATH  . Morphine And Related Other (See Comments)    Altered mental status    Antimicrobials this admission: 8/26 Clindamycin  >>  8/26 vancomycin >>   Microbiology results: 8/26 BCx: pending  Thank you for allowing pharmacy to be a part of this patient's care.  Pernell Dupre, PharmD, BCPS Clinical Pharmacist 03/30/2020 7:19 PM

## 2020-03-30 NOTE — ED Notes (Signed)
Attempted IV stick with no success. RN Karena Addison to attempt

## 2020-03-30 NOTE — Consult Note (Signed)
ORTHOPAEDIC CONSULTATION  REQUESTING PHYSICIAN: Sidney Ace, MD  Chief Complaint: Left foot infection  HPI: [name] is a 79 y.o. female who complains of  Worsening infection left foot. Seen by PCP today and referred to ER for gangrenous toes.  Pt with hx DM with PVD.  Last seen by vascular in January.  Past Medical History:  Diagnosis Date  . Acute heart failure ([place])   . Aortic stenosis   . CHF (congestive heart failure) ([place])   . Complication of anesthesia    Hard to wake patient up after having anesthesia  . Diabetes mellitus without complication ([place])   . Diabetic neuropathy ([facility])    Takes Lyrica  . Edema of both legs    Takes Lasix  . GERD (gastroesophageal reflux disease)   . High cholesterol   . HTN (hypertension)   . Hypertension   . PAD (peripheral artery disease) ([place])   . Shortness of breath dyspnea   . Spasm of back muscles   . Stroke ([place])   . Wound, open    Right great toe   Past Surgical History:  Procedure Laterality Date  . AMPUTATION TOE Left 06/09/2019   Procedure: Left third toe and partial great toe amputation and debridement;  Surgeon: Katha Cabal, MD;  Location: [facility];  Service: Vascular;  Laterality: Left;  . BYPASS GRAFT POPLITEAL TO TIBIAL Right 02/28/2016   Procedure: BYPASS GRAFT RIGHT BELOW KNEE POPLITEAL TO PERONEAL USING REVERSED RIGHT GREATER SAPHENOUS VEIN;  Surgeon: Conrad [place], MD;  Location: [place];  Service: Vascular;  Laterality: Right;  . CYST EXCISION     Abdomen  . EYE SURGERY Bilateral    Cataract removal  . INTRAMEDULLARY (IM) NAIL INTERTROCHANTERIC Left 02/04/2014   Procedure: INTRAMEDULLARY (IM) NAIL INTERTROCHANTRIC FEMORAL;  Surgeon: Mauri Pole, MD;  Location: [place];  Service: Orthopedics;  Laterality: Left;  . IR GENERIC HISTORICAL  03/01/2016   IR ANGIO INTRA EXTRACRAN SEL COM CAROTID INNOMINATE UNI R MOD SED 03/01/2016 [name], MD [facility]  . IR GENERIC HISTORICAL  03/01/2016   IR  ENDOVASC INTRACRANIAL INF OTHER THAN THROMBO ART INC DIAG ANGIO 03/01/2016 [name], MD [facility]  . IR GENERIC HISTORICAL  03/01/2016   IR INTRAVSC STENT CERV CAROTID W/O EMB-PROT MOD SED INC ANGIO 03/01/2016 [name], MD [facility]  . IR GENERIC HISTORICAL  06/03/2016   IR RADIOLOGIST EVAL & MGMT 06/03/2016 [facility]  . LOWER EXTREMITY ANGIOGRAPHY Left 02/09/2019   Procedure: LOWER EXTREMITY ANGIOGRAPHY;  Surgeon: Katha Cabal, MD;  Location: [place] [facility];  Service: Cardiovascular;  Laterality: Left;  . LOWER EXTREMITY ANGIOGRAPHY Left 03/10/2019   Procedure: LOWER EXTREMITY ANGIOGRAPHY;  Surgeon: Katha Cabal, MD;  Location: [place] [facility];  Service: Cardiovascular;  Laterality: Left;  . LOWER EXTREMITY ANGIOGRAPHY Left 04/27/2019   Procedure: LOWER EXTREMITY ANGIOGRAPHY;  Surgeon: Katha Cabal, MD;  Location: [place] [facility];  Service: Cardiovascular;  Laterality: Left;  . LOWER EXTREMITY ANGIOGRAPHY Left 06/08/2019   Procedure: Lower Extremity Angiography;  Surgeon: Katha Cabal, MD;  Location: [place] [facility];  Service: Cardiovascular;  Laterality: Left;  . ORIF TOE FRACTURE Right 02/08/2014   Procedure: OPEN REDUCTION INTERNAL FIXATION Right METATARSAL  FRACTURE ;  Surgeon: Wylene Simmer, MD;  Location: [place];  Service: Orthopedics;  Laterality: Right;  . PERIPHERAL VASCULAR CATHETERIZATION N/A 12/28/2015   Procedure: Abdominal Aortogram w/Lower Extremity;  Surgeon: Conrad Ball, MD;  Location: [place] [facility];  Service: Cardiovascular;  Laterality: N/A;  . RADIOLOGY WITH ANESTHESIA N/A 03/01/2016   Procedure: RADIOLOGY WITH ANESTHESIA;  Surgeon: [name], MD;  Location: [facility];  Service: Radiology;  Laterality: N/A;  . VEIN HARVEST Right 02/28/2016   Procedure: RIGHT GREATER SAPHENOUS VEIN HARVEST;  Surgeon: Conrad [place], MD;  Location: [place];  Service: Vascular;  Laterality: Right;   Social History    Socioeconomic History  . Marital status: Married    Spouse name: Not on file  . Number of children: Not on file  . Years of education: Not on file  . Highest education level: Not on file  Occupational History  . Not on file  Tobacco Use  . Smoking status: Never Smoker  . Smokeless tobacco: Never Used  . Tobacco comment: Never smoked  Substance and Sexual Activity  . Alcohol use: No    Alcohol/week: 0.0 standard drinks  . Drug use: No  . Sexual activity: Never  Other Topics Concern  . Not on file  Social History Narrative  . Not on file   Social Determinants of Health   Financial Resource Strain:   . Difficulty of Paying Living Expenses: Not on file  Food Insecurity:   . Worried About Charity fundraiser in the Last Year: Not on file  . Ran Out of Food in the Last Year: Not on file  Transportation Needs:   . Lack of Transportation (Medical): Not on file  . Lack of Transportation (Non-Medical): Not on file  Physical Activity:   . Days of Exercise per Week: Not on file  . Minutes of Exercise per Session: Not on file  Stress:   . Feeling of Stress : Not on file  Social Connections:   . Frequency of Communication with Friends and Family: Not on file  . Frequency of Social Gatherings with Friends and Family: Not on file  . Attends Religious Services: Not on file  . Active Member of Clubs or Organizations: Not on file  . Attends Archivist Meetings: Not on file  . Marital Status: Not on file   Family History  Problem Relation Age of Onset  . Diabetes Other   . Liver disease Mother   . CVA Father   . Diabetes Father   . Hypertension Father    Allergies  Allergen Reactions  . Iodine Anaphylaxis  . Penicillins Anaphylaxis    Tolerates ceftriaxone, cefazolin Did it involve swelling of the face/tongue/throat, SOB, or low BP? Unknown Did it involve sudden or severe rash/hives, skin peeling, or any reaction on the inside of your mouth or nose? Unknown Did  you need to seek medical attention at a hospital or doctor's office? Unknown When did it last happen?Unknown If all above answers are "NO", may proceed with cephalosporin use.   . Shellfish Allergy Anaphylaxis  . Sulfa Antibiotics Anaphylaxis  . Sulfacetamide Sodium Anaphylaxis  . Sulfasalazine Anaphylaxis  . Eggs Or Egg-Derived Products     Other reaction(s): SHORTNESS OF BREATH  . Morphine And Related Other (See Comments)    Altered mental status   Prior to Admission medications   Medication Sig Start Date End Date Taking? Authorizing Provider  acetaminophen (TYLENOL) 500 MG tablet Take 1,000 mg by mouth every 6 (six) hours as needed for mild pain.   Yes [provider]  amLODipine (NORVASC) 10 MG tablet Take 10 mg by mouth daily.   Yes [provider]  aspirin EC 81  MG EC tablet Take 1 tablet (81 mg total) by mouth daily. 04/29/19  Yes Stegmayer, Joelene Millin A, PA-C  atorvastatin (LIPITOR) 20 MG tablet Take 1 tablet (20 mg total) by mouth daily. Patient taking differently: Take 20 mg by mouth at bedtime.  03/13/16  Yes Virgina Jock A, PA-C  budesonide (PULMICORT) 0.25 MG/2ML nebulizer solution Take 2 mLs (0.25 mg total) by nebulization 2 (two) times daily. 06/10/18  Yes Dustin Flock, MD  carvedilol (COREG) 6.25 MG tablet Take 6.25 mg by mouth 2 (two) times daily with a meal.   Yes [provider]  cetirizine (ZYRTEC) 10 MG tablet Take 10 mg by mouth daily.    Yes [provider]  Cholecalciferol (VITAMIN D) 50 MCG (2000 UT) CAPS Take 2,000 Units by mouth daily.    Yes [provider]  clopidogrel (PLAVIX) 75 MG tablet Take 1 tablet (75 mg total) by mouth daily with breakfast. 03/04/17  Yes Conrad [place], MD  docusate sodium (COLACE) 100 MG capsule Take 100 mg by mouth 2 (two) times daily.   Yes [provider]  fluticasone (FLONASE) 50 MCG/ACT nasal spray Place 1 spray into both nostrils daily as needed for allergies.    Yes  [provider]  gabapentin (NEURONTIN) 300 MG capsule Take 300 mg by mouth every evening.  12/07/18  Yes [provider]  hydrALAZINE (APRESOLINE) 25 MG tablet Take 1 tablet (25 mg total) by mouth every 8 (eight) hours. Patient taking differently: Take 25 mg by mouth 3 (three) times daily.  12/28/18  Yes Fritzi Mandes, MD  insulin glargine (LANTUS) 100 UNIT/ML injection Inject 0.15 mLs (15 Units total) into the skin at bedtime. Patient taking differently: Inject 18 Units into the skin at bedtime.  12/28/18  Yes Fritzi Mandes, MD  magnesium oxide (MAG-OX) 400 MG tablet Take 400 mg by mouth 2 (two) times daily.    Yes [provider]  modafinil (PROVIGIL) 200 MG tablet Take 200 mg by mouth daily.   Yes [provider]  omeprazole (PRILOSEC) 20 MG capsule Take 1 capsule (20 mg total) by mouth daily. 04/17/18  Yes Sainani, Belia Heman, MD  polyethylene glycol (MIRALAX / GLYCOLAX) packet Take 17 g by mouth 2 (two) times daily. Patient taking differently: Take 17 g by mouth daily as needed for moderate constipation.  02/09/14  Yes Thurnell Lose, MD  potassium chloride SA (K-DUR,KLOR-CON) 20 MEQ tablet Take 20 mEq by mouth daily.   Yes [provider]  torsemide (DEMADEX) 20 MG tablet Take 1 tablet (20 mg total) by mouth 2 (two) times daily. 12/28/18  Yes Fritzi Mandes, MD  Vitamin E 180 MG CAPS Take 180 mg by mouth daily.   Yes [provider]   DG Chest 1 View  Result Date: 03/30/2020 CLINICAL DATA:  Sepsis, toe infection EXAM: CHEST  1 VIEW COMPARISON:  12/23/2018 FINDINGS: Stable cardiomegaly. Chronic bilateral interstitial prominence. No focal airspace consolidation, pleural effusion, or pneumothorax. Bones appear demineralized. IMPRESSION: Cardiomegaly with chronic bilateral interstitial prominence. No focal airspace consolidation. Electronically Signed   By: Davina Poke D.O.   On: 03/30/2020 11:38   DG Foot Complete Left  Result Date:  03/30/2020 CLINICAL DATA:  Sepsis.  Second toe infection. EXAM: LEFT FOOT - COMPLETE 3+ VIEW COMPARISON:  None. FINDINGS: Diffuse osteopenia is noted. 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 Vascular calcifications are noted. No fracture or dislocation is noted. No definite lytic destruction is seen to suggest acute  osteomyelitis. IMPRESSION: No definite lytic destruction is seen to suggest acute osteomyelitis. Electronically Signed   By: Marijo Conception M.D.   On: 03/30/2020 11:41    Positive ROS: All other systems have been reviewed and were otherwise negative with the exception of those mentioned in the HPI and as above.  12 point ROS was performed.  Physical Exam: General: Alert and oriented.  No apparent distress.  Vascular:  Left foot:Dorsalis Pedis:  absent Posterior Tibial:  absent  Right foot: Dorsalis Pedis:  absent Posterior Tibial:  absent  Neuro: gross sensation intact  Derm:Right foot without ulcer but significant atrophic skin especially over 1st mtpj.  Left foot with gangrene of 2nd toe. Lymphangitic streak to dorsal foot.  Darkened discoloration to lesser toes  Ortho/MS: s/p left 3rd toe amputation    Assessment: Gangrene left forefoot. DM with PVD  Plan: Will await vascular consult.  Pt without pulses and leg is cool. Concerned for any attempts at limb salvage till evaluated.  Pt high risk for amputation based on clinical appearance.     Elesa Hacker, DPM Cell [phone]   03/30/2020 6:18 PM

## 2020-03-30 NOTE — ED Triage Notes (Signed)
Sent in from PCP office  Presents with left 2nd toe infected  Toe is black in color

## 2020-03-30 NOTE — ED Notes (Signed)
Called pharmacy and spoke with Corene Cornea he is going to start working on verifying medications

## 2020-03-31 ENCOUNTER — Encounter: Payer: Self-pay | Admitting: Internal Medicine

## 2020-03-31 DIAGNOSIS — I96 Gangrene, not elsewhere classified: Secondary | ICD-10-CM

## 2020-03-31 DIAGNOSIS — A419 Sepsis, unspecified organism: Secondary | ICD-10-CM

## 2020-03-31 LAB — COMPREHENSIVE METABOLIC PANEL
ALT: 10 U/L (ref 0–44)
AST: 13 U/L — ABNORMAL LOW (ref 15–41)
Albumin: 2.9 g/dL — ABNORMAL LOW (ref 3.5–5.0)
Alkaline Phosphatase: 60 U/L (ref 38–126)
Anion gap: 10 (ref 5–15)
BUN: 13 mg/dL (ref 8–23)
CO2: 28 mmol/L (ref 22–32)
Calcium: 9.4 mg/dL (ref 8.9–10.3)
Chloride: 103 mmol/L (ref 98–111)
Creatinine, Ser: 0.69 mg/dL (ref 0.44–1.00)
GFR calc Af Amer: >60 mL/min (ref >60)
GFR calc non Af Amer: >60 mL/min (ref >60)
Glucose, Bld: 243 mg/dL — ABNORMAL HIGH (ref 70–99)
Potassium: 4.1 mmol/L (ref 3.5–5.1)
Sodium: 141 mmol/L (ref 135–145)
Total Bilirubin: 0.9 mg/dL (ref 0.3–1.2)
Total Protein: 5.8 g/dL — ABNORMAL LOW (ref 6.5–8.1)

## 2020-03-31 LAB — CBC
HCT: 32.8 % — ABNORMAL LOW (ref 36.0–46.0)
Hemoglobin: 10.8 g/dL — ABNORMAL LOW (ref 12.0–15.0)
MCH: 27.3 pg (ref 26.0–34.0)
MCHC: 32.9 g/dL (ref 30.0–36.0)
MCV: 83 fL (ref 80.0–100.0)
Platelets: 284 10*3/uL (ref 150–400)
RBC: 3.95 MIL/uL (ref 3.87–5.11)
RDW: 14.5 % (ref 11.5–15.5)
WBC: 9.4 10*3/uL (ref 4.0–10.5)
nRBC: 0 % (ref 0.0–0.2)

## 2020-03-31 LAB — GLUCOSE, CAPILLARY
Glucose-Capillary: 222 mg/dL — ABNORMAL HIGH (ref 70–99)
Glucose-Capillary: 238 mg/dL — ABNORMAL HIGH (ref 70–99)
Glucose-Capillary: 273 mg/dL — ABNORMAL HIGH (ref 70–99)
Glucose-Capillary: 170 mg/dL — ABNORMAL HIGH (ref 70–99)

## 2020-03-31 LAB — URINALYSIS, COMPLETE (UACMP) WITH MICROSCOPIC
Bilirubin Urine: NEGATIVE
Glucose, UA: >=500 mg/dL — AB
Hgb urine dipstick: NEGATIVE
Ketones, ur: NEGATIVE mg/dL
Nitrite: NEGATIVE
Protein, ur: NEGATIVE mg/dL
Specific Gravity, Urine: 1.007 (ref 1.005–1.030)
Squamous Epithelial / HPF: NONE SEEN (ref 0–5)
pH: 9 — ABNORMAL HIGH (ref 5.0–8.0)

## 2020-03-31 LAB — PROTIME-INR
INR: 1.1 (ref 0.8–1.2)
Prothrombin Time: 14.2 seconds (ref 11.4–15.2)

## 2020-03-31 LAB — MRSA PCR SCREENING: MRSA by PCR: NEGATIVE

## 2020-03-31 LAB — HEMOGLOBIN A1C
Hgb A1c MFr Bld: 10.2 % — ABNORMAL HIGH (ref 4.8–5.6)
Mean Plasma Glucose: 246.04 mg/dL

## 2020-03-31 MED ORDER — SODIUM CHLORIDE 0.9 % IV SOLN
1.0000 g | Freq: Every day | INTRAVENOUS | Status: DC
  Administered 2020-03-31 – 2020-04-07 (×7): 1 g via INTRAVENOUS
  Filled 2020-03-31: qty 1
  Filled 2020-03-31: qty 10
  Filled 2020-03-31: qty 1
  Filled 2020-03-31 (×2): qty 10
  Filled 2020-03-31 (×4): qty 1

## 2020-03-31 MED ORDER — METRONIDAZOLE IN NACL 5-0.79 MG/ML-% IV SOLN
500.0000 mg | Freq: Three times a day (TID) | INTRAVENOUS | Status: DC
  Administered 2020-03-31 – 2020-04-07 (×21): 500 mg via INTRAVENOUS
  Filled 2020-03-31 (×24): qty 100

## 2020-03-31 MED ORDER — ATORVASTATIN CALCIUM 20 MG PO TABS
20.0000 mg | ORAL_TABLET | Freq: Every day | ORAL | Status: DC
  Administered 2020-03-31 – 2020-04-07 (×8): 20 mg via ORAL
  Filled 2020-03-31 (×8): qty 1

## 2020-03-31 MED ORDER — ASPIRIN EC 81 MG PO TBEC
81.0000 mg | DELAYED_RELEASE_TABLET | Freq: Every day | ORAL | Status: DC
  Administered 2020-03-31 – 2020-04-08 (×7): 81 mg via ORAL
  Filled 2020-03-31 (×7): qty 1

## 2020-03-31 NOTE — Progress Notes (Signed)
Pharmacy Antibiotic Note  [name] is a 79 y.o. female with history of diabetes and peripheral vascular disease admitted on 03/30/2020 with gangrenous left second toe.  Pharmacy has been consulted for vancomycin dosing. Patient is also on ceftriaxone and metronidazole. Vascular has been consulted for potential revascularization procedure. Podiatry also following. Patient is at high risk for amputation.   Today, 03/31/20  --Day # 2 antibiotics --Patient is afebrile, WBC trending down --Scr trending up (0.54 >> 0.69)  Plan: Continue vancomycin 500mg  IV every 12 hours.  Daily Scr per protocol. Levels at steady state as indicated. Pharmacy will continue to monitor and adjust dose as needed.   Height: 5\' 5"  (165.1 cm) Weight: 66.2 kg (146 lb) IBW/kg (Calculated) : 57  Temp (24hrs), Avg:97.7 F (36.5 C), Min:96.2 F (35.7 C), Max:98.4 F (36.9 C)  Recent Labs  Lab 03/30/20 1058 03/31/20 0634  WBC 13.9* 9.4  CREATININE 0.54 0.69  LATICACIDVEN 1.4  --     Estimated Creatinine Clearance: 53 mL/min (by C-G formula based on SCr of 0.69 mg/dL).    Allergies  Allergen Reactions  . Iodine Anaphylaxis  . Penicillins Anaphylaxis    Tolerates ceftriaxone, cefazolin Did it involve swelling of the face/tongue/throat, SOB, or low BP? Unknown Did it involve sudden or severe rash/hives, skin peeling, or any reaction on the inside of your mouth or nose? Unknown Did you need to seek medical attention at a hospital or doctor's office? Unknown When did it last happen?Unknown If all above answers are "NO", may proceed with cephalosporin use.   . Shellfish Allergy Anaphylaxis  . Sulfa Antibiotics Anaphylaxis  . Sulfacetamide Sodium Anaphylaxis  . Sulfasalazine Anaphylaxis  . Eggs Or Egg-Derived Products     Other reaction(s): SHORTNESS OF BREATH  . Morphine And Related Other (See Comments)    Altered mental status    Antimicrobials this admission: 8/26 clindamycin  >> 8/27 8/26  vancomycin >>  8/27 ceftriaxone >> 8/27 metronidazole >>  Microbiology results: 8/26 BCx: NGTD  Thank you for allowing pharmacy to be a part of this patient's care.  Benita Gutter 03/31/2020 4:24 PM

## 2020-03-31 NOTE — Progress Notes (Signed)
Inpatient Diabetes Program Recommendations  AACE/ADA: New Consensus Statement on Inpatient Glycemic Control (2015)  Target Ranges:  Prepandial:   less than 140 mg/dL      Peak postprandial:   less than 180 mg/dL (1-2 hours)      Critically ill patients:  140 - 180 mg/dL   Lab Results  Component Value Date   GLUCAP 238 (H) 03/31/2020   HGBA1C 10.2 (H) 03/30/2020    Review of Glycemic Control Results for [name], [name] (MRN [id number]) as of 03/31/2020 14:12  Ref. Range 03/30/2020 23:07 03/31/2020 08:02 03/31/2020 11:57  Glucose-Capillary Latest Ref Range: 70 - 99 mg/dL 294 (H) 222 (H) 238 (H)   Diabetes history:  DM2  Outpatient Diabetes medications:  lantus 18 units QHS  Current orders for Inpatient glycemic control:  novolog 0-15 units tid novolog 0-5 qhs  Inpatient Diabetes Program Recommendations:    1) Lantus 15 units QHS 2) If post prandials continue to be elevated-Novolog 2 units tid with meals if eats at least 50%   Will continue to follow while inpatient.  Thank you, Reche [place], RN, BSN Diabetes Coordinator Inpatient Diabetes Program [phone] (team pager from 8a-5p)

## 2020-03-31 NOTE — Consult Note (Signed)
[facility] Vascular Consult Note  MRN : [id number]  [name] is a 79 y.o. (13-Nov-1940) female who presents with chief complaint of  Chief Complaint  Patient presents with  . Toe Pain   History of Present Illness:  Of note: No family present at bedside. Patient is lethargic and unable to provide any history. Information for this consult was obtained from bedside nurse and previous Epic notation.  [name] is a 79 year old female with medical history significant for diastolic congestive heart failure, diabetes mellitus, diabetic neuropathy, hypertension, hyperlipidemia, GERD, peripheral vascular disease, CVA presents to the ED at the request of her primary care physician for evaluation of a black necrotic left second toe with presumed cellulitis.    Last endovascular intervention on June 08, 2019: 1. Introduction catheter into left lower extremity 3rd order catheter placement  2. Contrast injection left lower extremity for distal runoff  3. Percutaneous transluminal angioplasty left superficial femoral and popliteal arteries to 4 mm with L[employer] drug-eluting balloon 4. Percutaneous transluminal angioplasty left anterior tibial to 3 mm with an ultra score balloon  5. 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  Vascular surgery was consulted by Dr. Priscella Mann for possible endovascular intervention.  Current Facility-Administered Medications  Medication Dose Route Frequency Provider Last Rate Last Admin  . 0.9 %  sodium chloride infusion   Intravenous Continuous Ralene Muskrat B, MD 75 mL/hr at 03/30/20 2312 Restarted at 03/30/20 2312  . acetaminophen (TYLENOL) tablet 650 mg  650 mg Oral Q6H PRN Ralene Muskrat B, MD       Or  .  acetaminophen (TYLENOL) suppository 650 mg  650 mg Rectal Q6H PRN Sreenath, Sudheer B, MD      . albuterol (PROVENTIL) (2.5 MG/3ML) 0.083% nebulizer solution 3 mL  3 mL Inhalation Q6H PRN Sreenath, Sudheer B, MD      . amLODipine (NORVASC) tablet 10 mg  10 mg Oral Daily Ralene Muskrat B, MD   10 mg at 03/31/20 0823  . aspirin EC tablet 81 mg  81 mg Oral Daily Shawna Clamp, MD   81 mg at 03/31/20 1356  . atorvastatin (LIPITOR) tablet 20 mg  20 mg Oral Daily Shawna Clamp, MD      . budesonide (PULMICORT) nebulizer solution 0.25 mg  0.25 mg Nebulization BID Priscella Mann, Sudheer B, MD   0.25 mg at 03/31/20 1044  . carvedilol (COREG) tablet 6.25 mg  6.25 mg Oral BID WC Ralene Muskrat B, MD   6.25 mg at 03/31/20 0827  . cefTRIAXone (ROCEPHIN) 1 g in sodium chloride 0.9 % 100 mL IVPB  1 g Intravenous Daily Shawna Clamp, MD      . clopidogrel (PLAVIX) tablet 75 mg  75 mg Oral Q breakfast Ralene Muskrat B, MD   75 mg at 03/31/20 0823  . docusate sodium (COLACE) capsule 100 mg  100 mg Oral BID Ralene Muskrat B, MD   100 mg at 03/31/20 0828  . enoxaparin (LOVENOX) injection 40 mg  40 mg Subcutaneous Q24H Ralene Muskrat B, MD   40 mg at 03/30/20 2311  . folic acid (FOLVITE) tablet 1 mg  1 mg Oral Daily Sreenath, Sudheer B, MD   1 mg at 03/31/20 5277  . gabapentin (NEURONTIN) capsule 300 mg  300 mg Oral QPM Sidney Ace, MD   300  mg at 03/30/20 2323  . hydrALAZINE (APRESOLINE) injection 10 mg  10 mg Intravenous Q4H PRN Ralene Muskrat B, MD      . hydrALAZINE (APRESOLINE) tablet 25 mg  25 mg Oral Q8H Sreenath, Sudheer B, MD   25 mg at 03/31/20 1402  . insulin aspart (novoLOG) injection 0-15 Units  0-15 Units Subcutaneous TID WC Ralene Muskrat B, MD   5 Units at 03/31/20 1356  . insulin aspart (novoLOG) injection 0-5 Units  0-5 Units Subcutaneous QHS Ralene Muskrat B, MD   3 Units at 03/30/20 2322  . insulin detemir (LEVEMIR) injection 10 Units  10 Units Subcutaneous QHS Ralene Muskrat B, MD   10 Units at 03/30/20 2329  . ipratropium-albuterol (DUONEB) 0.5-2.5 (3) MG/3ML nebulizer solution 3 mL  3 mL Nebulization BID Priscella Mann, Sudheer B, MD   3 mL at 03/31/20 1044  . isosorbide dinitrate (ISORDIL) tablet 30 mg  30 mg Oral Daily Ralene Muskrat B, MD   30 mg at 03/31/20 0347  . loratadine (CLARITIN) tablet 10 mg  10 mg Oral Daily Ralene Muskrat B, MD   10 mg at 03/31/20 0828  . magnesium oxide (MAG-OX) tablet 400 mg  400 mg Oral BID Ralene Muskrat B, MD   400 mg at 03/31/20 0828  . metroNIDAZOLE (FLAGYL) IVPB 500 mg  500 mg Intravenous Q8H Kumar, Rubye Oaks, MD      . modafinil (PROVIGIL) tablet 200 mg  200 mg Oral Daily Sreenath, Sudheer B, MD      . ondansetron (ZOFRAN) tablet 4 mg  4 mg Oral Q6H PRN Sreenath, Sudheer B, MD       Or  . ondansetron (ZOFRAN) injection 4 mg  4 mg Intravenous Q6H PRN Sreenath, Sudheer B, MD      . pantoprazole (PROTONIX) EC tablet 40 mg  40 mg Oral Daily Ralene Muskrat B, MD   40 mg at 03/31/20 0829  . torsemide (DEMADEX) tablet 20 mg  20 mg Oral BID Ralene Muskrat B, MD   20 mg at 03/31/20 0829  . traZODone (DESYREL) tablet 25 mg  25 mg Oral QHS PRN Ralene Muskrat B, MD   25 mg at 03/30/20 2324  . vancomycin (VANCOREADY) IVPB 500 mg/100 mL  500 mg Intravenous Q12H Pernell Dupre, RPH 100 mL/hr at 03/31/20 0737 500 mg at 03/31/20 4259   Past Medical History:  Diagnosis Date  . Acute heart failure ([place])   . Aortic stenosis   . CHF (congestive heart failure) ([place])   . Complication of anesthesia    Hard to wake patient up after having anesthesia  . Diabetes mellitus without complication ([place])   . Diabetic neuropathy ([facility])    Takes Lyrica  . Edema of both legs    Takes Lasix  . GERD (gastroesophageal reflux disease)   . High cholesterol   . HTN (hypertension)   . Hypertension   . PAD (peripheral artery disease) ([place])   . Shortness of breath dyspnea   . Spasm of back muscles   . Stroke ([place])   . Wound, open     Right great toe   Past Surgical History:  Procedure Laterality Date  . AMPUTATION TOE Left 06/09/2019   Procedure: Left third toe and partial great toe amputation and debridement;  Surgeon: Katha Cabal, MD;  Location: [facility];  Service: Vascular;  Laterality: Left;  . BYPASS GRAFT POPLITEAL TO TIBIAL Right 02/28/2016   Procedure: BYPASS GRAFT RIGHT BELOW KNEE POPLITEAL TO PERONEAL USING REVERSED  RIGHT GREATER SAPHENOUS VEIN;  Surgeon: Conrad Bovill, MD;  Location: [place];  Service: Vascular;  Laterality: Right;  . CYST EXCISION     Abdomen  . EYE SURGERY Bilateral    Cataract removal  . INTRAMEDULLARY (IM) NAIL INTERTROCHANTERIC Left 02/04/2014   Procedure: INTRAMEDULLARY (IM) NAIL INTERTROCHANTRIC FEMORAL;  Surgeon: Mauri Pole, MD;  Location: [place];  Service: Orthopedics;  Laterality: Left;  . IR GENERIC HISTORICAL  03/01/2016   IR ANGIO INTRA EXTRACRAN SEL COM CAROTID INNOMINATE UNI R MOD SED 03/01/2016 [name], MD [facility]  . IR GENERIC HISTORICAL  03/01/2016   IR ENDOVASC INTRACRANIAL INF OTHER THAN THROMBO ART INC DIAG ANGIO 03/01/2016 [name], MD [facility]  . IR GENERIC HISTORICAL  03/01/2016   IR INTRAVSC STENT CERV CAROTID W/O EMB-PROT MOD SED INC ANGIO 03/01/2016 [name], MD [facility]  . IR GENERIC HISTORICAL  06/03/2016   IR RADIOLOGIST EVAL & MGMT 06/03/2016 [facility]  . LOWER EXTREMITY ANGIOGRAPHY Left 02/09/2019   Procedure: LOWER EXTREMITY ANGIOGRAPHY;  Surgeon: Katha Cabal, MD;  Location: [place] [facility];  Service: Cardiovascular;  Laterality: Left;  . LOWER EXTREMITY ANGIOGRAPHY Left 03/10/2019   Procedure: LOWER EXTREMITY ANGIOGRAPHY;  Surgeon: Katha Cabal, MD;  Location: [place] [facility];  Service: Cardiovascular;  Laterality: Left;  . LOWER EXTREMITY ANGIOGRAPHY Left 04/27/2019   Procedure: LOWER EXTREMITY ANGIOGRAPHY;  Surgeon: Katha Cabal, MD;  Location: [place] [facility];  Service:  Cardiovascular;  Laterality: Left;  . LOWER EXTREMITY ANGIOGRAPHY Left 06/08/2019   Procedure: Lower Extremity Angiography;  Surgeon: Katha Cabal, MD;  Location: [place] [facility];  Service: Cardiovascular;  Laterality: Left;  . ORIF TOE FRACTURE Right 02/08/2014   Procedure: OPEN REDUCTION INTERNAL FIXATION Right METATARSAL  FRACTURE ;  Surgeon: Wylene Simmer, MD;  Location: [place];  Service: Orthopedics;  Laterality: Right;  . PERIPHERAL VASCULAR CATHETERIZATION N/A 12/28/2015   Procedure: Abdominal Aortogram w/Lower Extremity;  Surgeon: Conrad Avoca, MD;  Location: [place] [facility];  Service: Cardiovascular;  Laterality: N/A;  . RADIOLOGY WITH ANESTHESIA N/A 03/01/2016   Procedure: RADIOLOGY WITH ANESTHESIA;  Surgeon: [name], MD;  Location: [place];  Service: Radiology;  Laterality: N/A;  . VEIN HARVEST Right 02/28/2016   Procedure: RIGHT GREATER SAPHENOUS VEIN HARVEST;  Surgeon: Conrad Hartwell, MD;  Location: [place];  Service: Vascular;  Laterality: Right;   Social History Social History   Tobacco Use  . Smoking status: Never Smoker  . Smokeless tobacco: Never Used  . Tobacco comment: Never smoked  Substance Use Topics  . Alcohol use: No    Alcohol/week: 0.0 standard drinks  . Drug use: No   Family History Family History  Problem Relation Age of Onset  . Diabetes Other   . Liver disease Mother   . CVA Father   . Diabetes Father   . Hypertension Father   Unable to ascertain due to the patient's mental status  Allergies  Allergen Reactions  . Iodine Anaphylaxis  . Penicillins Anaphylaxis    Tolerates ceftriaxone, cefazolin Did it involve swelling of the face/tongue/throat, SOB, or low BP? Unknown Did it involve sudden or severe rash/hives, skin peeling, or any reaction on the inside of your mouth or nose? Unknown Did you need to seek medical attention at a hospital or doctor's office? Unknown When did it last happen?Unknown If all above answers are "NO",  may proceed with cephalosporin use.   . Shellfish  Allergy Anaphylaxis  . Sulfa Antibiotics Anaphylaxis  . Sulfacetamide Sodium Anaphylaxis  . Sulfasalazine Anaphylaxis  . Eggs Or Egg-Derived Products     Other reaction(s): SHORTNESS OF BREATH  . Morphine And Related Other (See Comments)    Altered mental status   REVIEW OF SYSTEMS (Negative unless checked)  Constitutional: [] Weight loss  [] Fever  [] Chills Cardiac: [] Chest pain   [] Chest pressure   [] Palpitations   [] Shortness of breath when laying flat   [] Shortness of breath at rest   [] Shortness of breath with exertion. Vascular:  [] Pain in legs with walking   [] Pain in legs at rest   [] Pain in legs when laying flat   [] Claudication   [] Pain in feet when walking  [] Pain in feet at rest  [] Pain in feet when laying flat   [] History of DVT   [] Phlebitis   [] Swelling in legs   [] Varicose veins   [x] Non-healing ulcers Pulmonary:   [] Uses home oxygen   [] Productive cough   [] Hemoptysis   [] Wheeze  [] COPD   [] Asthma Neurologic:  [] Dizziness  [] Blackouts   [] Seizures   [] History of stroke   [] History of TIA  [] Aphasia   [] Temporary blindness   [] Dysphagia   [] Weakness or numbness in arms   [] Weakness or numbness in legs Musculoskeletal:  [] Arthritis   [] Joint swelling   [] Joint pain   [] Low back pain Hematologic:  [] Easy bruising  [] Easy bleeding   [] Hypercoagulable state   [] Anemic  [] Hepatitis Gastrointestinal:  [] Blood in stool   [] Vomiting blood  [] Gastroesophageal reflux/heartburn   [] Difficulty swallowing. Genitourinary:  [] Chronic kidney disease   [] Difficult urination  [] Frequent urination  [] Burning with urination   [] Blood in urine Skin:  [] Rashes   [x] Ulcers   [x] Wounds Psychological:  [] History of anxiety   []  History of major depression.  Physical Examination  Vitals:   03/31/20 0638 03/31/20 0802 03/31/20 0822 03/31/20 1402  BP: (!) 108/59 130/62 132/63 128/64  Pulse: 74 73    Resp: 17 18    Temp: 98 F (36.7 C) 98.2 F (36.8  C)    TempSrc: Oral Oral    SpO2: 100% 100%    Weight:      Height:       Body mass index is 24.3 kg/m. Gen: Lethargic barely answers to name, NAD Head: [place]/AT, No temporalis wasting. Prominent temp pulse not noted. Ear/Nose/Throat: Hearing grossly intact, nares w/o erythema or drainage, oropharynx w/o Erythema/Exudate Eyes: Sclera non-icteric, conjunctiva clear Neck: Trachea midline.  No JVD.  Pulmonary:  Good air movement, respirations not labored, equal bilaterally.  Cardiac: RRR, normal S1, S2. Vascular:  Vessel Right Left  Radial Palpable Palpable  Ulnar Palpable Palpable  Brachial Palpable Palpable  Carotid Palpable, without bruit Palpable, without bruit  Aorta Not palpable N/A  Femoral Palpable Palpable  Popliteal Palpable Palpable  PT Non-Palpable Non-Palpable  DP Non-Palpable Non-Palpable   Left lower extremity: Second toe with dry gangrene.  Previous amputation site for the first and third toes are healed.  Unable to palpate pedal pulses.  Thigh soft.  Calf soft.  Extremity is warm distally toes transitioned to cooler at the remaining toes.  Gastrointestinal: soft, non-tender/non-distended. No guarding/reflex.  Musculoskeletal: M/S 5/5 throughout.  Extremities without ischemic changes.  No deformity or atrophy. No edema. Neurologic: Sensation grossly intact in extremities.  Symmetrical.  Speech is fluent. Motor exam as listed above. Psychiatric: Judgment intact, Mood & affect appropriate for pt's clinical situation. Dermatologic: As above Lymph : No Cervical, Axillary, or  Inguinal lymphadenopathy.  CBC Lab Results  Component Value Date   WBC 9.4 03/31/2020   HGB 10.8 (L) 03/31/2020   HCT 32.8 (L) 03/31/2020   MCV 83.0 03/31/2020   PLT 284 03/31/2020   BMET    Component Value Date/Time   NA 141 03/31/2020 0634   K 4.1 03/31/2020 0634   CL 103 03/31/2020 0634   CO2 28 03/31/2020 0634   GLUCOSE 243 (H) 03/31/2020 0634   BUN 13 03/31/2020 0634   CREATININE  0.69 03/31/2020 0634   CALCIUM 9.4 03/31/2020 0634   GFRNONAA >60 03/31/2020 0634   GFRAA >60 03/31/2020 0258   Estimated Creatinine Clearance: 53 mL/min (by C-G formula based on SCr of 0.69 mg/dL).  COAG Lab Results  Component Value Date   INR 1.1 03/31/2020   INR 1.1 03/30/2020   INR 1.1 06/09/2019   Radiology DG Chest 1 View  Result Date: 03/30/2020 CLINICAL DATA:  Sepsis, toe infection EXAM: CHEST  1 VIEW COMPARISON:  12/23/2018 FINDINGS: Stable cardiomegaly. Chronic bilateral interstitial prominence. No focal airspace consolidation, pleural effusion, or pneumothorax. Bones appear demineralized. IMPRESSION: Cardiomegaly with chronic bilateral interstitial prominence. No focal airspace consolidation. Electronically Signed   By: Davina Poke D.O.   On: 03/30/2020 11:38   DG Foot Complete Left  Result Date: 03/30/2020 CLINICAL DATA:  Sepsis.  Second toe infection. EXAM: LEFT FOOT - COMPLETE 3+ VIEW COMPARISON:  None. FINDINGS: Diffuse osteopenia is noted. 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 Vascular calcifications are noted. No fracture or dislocation is noted. No definite lytic destruction is seen to suggest acute osteomyelitis. IMPRESSION: No definite lytic destruction is seen to suggest acute osteomyelitis. Electronically Signed   By: Marijo Conception M.D.   On: 03/30/2020 11:41   Assessment/Plan The patient is a 79 year old female with multiple medical issues well-known to our service as we have treated her for her atherosclerotic disease to the left lower extremity in the past requiring endovascular intervention amputation of the first and third toes  1.  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 Patient was lost to follow-up last seen member third,  2020 Presents to the [facility] emergency department at the request of her PCP due to a gangrenous second toe on the left foot Unable to palpate pedal pulses in the setting of known peripheral artery disease and multiple risk factors recommend undergoing a left lower extremity angiogram possible intervention and attempt assess the patient's anatomy and contributing degree of atherosclerotic disease.  Appropriate, an attempt to revascularize electively at that time.  Patient is lethargic at this time, will also try to reach out to the patient's husband her power of attorney however we will plan on a left lower extremity angiogram possible intervention on Monday with Dr. Lucky Cowboy  2.  Diabetes: On appropriate medications Encouraged good control as its slows the progression of atherosclerotic disease.  3.  Hyperlipidemia: On Aspirin, Plavix and Statin for medical management. Encouraged good control as its slows the progression of atherosclerotic disease.  Discussed with Dr. Mayme Genta, PA-C  03/31/2020 2:04 PM    This note was created with Dragon medical transcription system.  Any error is purely unintentional

## 2020-03-31 NOTE — Progress Notes (Signed)
PROGRESS NOTE    [name]  JJK:[id number] DOB: 03-Jun-1941 DOA: 03/30/2020 PCP: [name], MD   Brief Narrative:  [name] is a 79 y.o. female with medical history significant of diastolic congestive heart failure, diabetes mellitus, diabetic neuropathy, hypertension, hyperlipidemia, GERD, peripheral vascular disease, CVA presents to the ED at the request of her primary care physician for evaluation of a black necrotic left second toe with presumed cellulitis.  The majority the history was gained by reading the medical record and speaking with the ED physician  Last documentation in care everywhere was from 2019.  No updated documentation but apparently she is known to [facility].  It is unclear exactly how long her symptoms been going on or how long her toes look black. Her story is rather inconsistent.  In the emergency department she was given 1 dose of vancomycin and clindamycin because of numerous antibiotic allergies/intolerances.  Hospitalist contacted for admission. Apparently the toes been black for the last 3 to 4 days.  No osteomyelitis was noted on imaging.  Patient is admitted for necrotic left foot toes with superimposed cellulitis,  Patient is started on IV antibiotics,  Patient was seen by podiatry,  recommended patient needs vascular evaluation. Patient high risk for amputation based on clinical appearance.  Patient eventually need amputation of those necrotic toes.  Vascular surgery recommended left lower extremity angiogram,  possible intervention on Monday.   Assessment & Plan:   Active Problems:   Infected wound   Necrotic left second toe with superimposed cellulitis Exact timeframe is unknown. Patient states ~been black for 3 to 4 days, we suspect longer. Sensation decreased,  Patient is known to podiatry as well as [facility].  No osteomyelitis noted on plain film imaging. Clinically not septic by  criteria.  Plan: Admit inpatient, MedSurg Empiric vancomycin IV, pharmacy dosing Clindamycin 600 mg IV every 8 hours. Continue  IVF Follow cultures taken on admission As needed pain control WOC consult : out of scope of practice. Vascular consulted ,  Dr. Lucky Cowboy recommended the left lower extremity angiogram,  possible intervention on Monday Podiatry consulted,   Dr. Vickki Muff states patient at high risk for amputation given clinical situation. Suspect patient will require surgical intervention/amputation Appreciate surgical recommendations  Chronic diastolic congestive heart failure Does not appear acutely exacerbated. Continue goal-directed medical therapy Continue Coreg 6.25 twice daily Continue Demadex 20 twice daily No lisinopril noted on home med rec.  Peripheral vascular disease History of CVA. Continue dual antiplatelet therapy with aspirin and Plavix  Essential hypertension Continue Coreg 6.25 twice daily Continue amlodipine 10 mg daily Continue hydralazine 25 mg p.o. every 8 hours As needed hydralazine  Insulin-dependent diabetes mellitus Diabetic neuropathy Home Lantus dose 18 units daily at bedtime Plan: Start Lantus 18 units nightly Moderate sliding scale Carb modified diet Check A1c Continue gabapentin/pregabalin Consider diabetes coordinator consult if glycemic control becomes difficult  GERD PPI  COPD Appears baseline Not acutely exacerbated Continue home nebulizer/inhaler regimen   DVT prophylaxis: Lovenox Code Status: Full Family Communication: Husband at bedside Disposition Plan: Anticipate return to previous home environment Consults called: Podiatry: Vickki Muff; vascular: Dew Patient is not medically clear requiring possible amputation and further treatment.    Consultants:    Podiatry, Wound care and Vascular surgery.  Procedures:  Antimicrobials:  Anti-infectives (From admission, onward)   Start     Dose/Rate Route Frequency  Ordered Stop   03/31/20 1400  metroNIDAZOLE (FLAGYL) IVPB 500 mg  500 mg 100 mL/hr over 60 Minutes Intravenous Every 8 hours 03/31/20 1043     03/31/20 1200  cefTRIAXone (ROCEPHIN) 1 g in sodium chloride 0.9 % 100 mL IVPB        1 g 200 mL/hr over 30 Minutes Intravenous Daily 03/31/20 1043     03/31/20 0600  vancomycin (VANCOREADY) IVPB 500 mg/100 mL        500 mg 100 mL/hr over 60 Minutes Intravenous Every 12 hours 03/30/20 1917     03/30/20 1615  cefTRIAXone (ROCEPHIN) 1 g in sodium chloride 0.9 % 100 mL IVPB  Status:  Discontinued        1 g 200 mL/hr over 30 Minutes Intravenous Every 24 hours 03/30/20 1607 03/30/20 1706   03/30/20 1615  clindamycin (CLEOCIN) IVPB 600 mg  Status:  Discontinued        600 mg 100 mL/hr over 30 Minutes Intravenous Every 8 hours 03/30/20 1608 03/30/20 1706   03/30/20 1600  vancomycin (VANCOCIN) IVPB 1000 mg/200 mL premix        1,000 mg 200 mL/hr over 60 Minutes Intravenous  Once 03/30/20 1556 03/30/20 2243   03/30/20 1600  clindamycin (CLEOCIN) IVPB 600 mg  Status:  Discontinued        600 mg 100 mL/hr over 30 Minutes Intravenous Every 8 hours 03/30/20 1556 03/31/20 1043     Subjective: Patient was seen and examined at bedside.  She is very lethargic,  arousable on a sternal rub,   tells her name and goes back to sleep.  Objective: Vitals:   03/31/20 0638 03/31/20 0802 03/31/20 0822 03/31/20 1402  BP: (!) 108/59 130/62 132/63 128/64  Pulse: 74 73    Resp: 17 18    Temp: 98 F (36.7 C) 98.2 F (36.8 C)    TempSrc: Oral Oral    SpO2: 100% 100%    Weight:      Height:        Intake/Output Summary (Last 24 hours) at 03/31/2020 1508 Last data filed at 03/31/2020 0400 Gross per 24 hour  Intake 793.75 ml  Output --  Net 793.75 ml   Filed Weights   03/30/20 1659  Weight: 66.2 kg    Examination:  General exam: Appears calm and comfortable, lethargic arousable to sternal rub. Respiratory system: Clear to auscultation. Respiratory  effort normal. Cardiovascular system: S1 & S2 heard, RRR. No JVD, murmurs, rubs, gallops or clicks. No pedal edema. Gastrointestinal system: Abdomen is nondistended, soft and nontender. No organomegaly or masses felt. Normal bowel sounds heard. Central nervous system: Alert and oriented. No focal neurological deficits. Extremities:  Left LE: cold, gangrenous toes noted. Skin: No rashes, lesions or ulcers Psychiatry: Judgement and insight appear normal. Mood & affect appropriate.     Data Reviewed: I have personally reviewed following labs and imaging studies  CBC: Recent Labs  Lab 03/30/20 1058 03/31/20 0634  WBC 13.9* 9.4  NEUTROABS 10.2*  --   HGB 12.0 10.8*  HCT 37.6 32.8*  MCV 83.6 83.0  PLT 305 740   Basic Metabolic Panel: Recent Labs  Lab 03/30/20 1058 03/31/20 0634  NA 136 141  K 4.3 4.1  CL 99 103  CO2 30 28  GLUCOSE 265* 243*  BUN 13 13  CREATININE 0.54 0.69  CALCIUM 9.3 9.4   GFR: Estimated Creatinine Clearance: 53 mL/min (by C-G formula based on SCr of 0.69 mg/dL). Liver Function Tests: Recent Labs  Lab 03/30/20 1058 03/31/20 0634  AST 17 13*  ALT 13 10  ALKPHOS 69 60  BILITOT 0.6 0.9  PROT 6.6 5.8*  ALBUMIN 3.2* 2.9*   No results for input(s): LIPASE, AMYLASE in the last 168 hours. No results for input(s): AMMONIA in the last 168 hours. Coagulation Profile: Recent Labs  Lab 03/30/20 2238 03/31/20 0634  INR 1.1 1.1   Cardiac Enzymes: Recent Labs  Lab 03/30/20 1058  CKTOTAL 45   BNP (last 3 results) No results for input(s): PROBNP in the last 8760 hours. HbA1C: Recent Labs    03/30/20 1722  HGBA1C 10.2*   CBG: Recent Labs  Lab 03/30/20 2307 03/31/20 0802 03/31/20 1157  GLUCAP 294* 222* 238*   Lipid Profile: No results for input(s): CHOL, HDL, LDLCALC, TRIG, CHOLHDL, LDLDIRECT in the last 72 hours. Thyroid Function Tests: No results for input(s): TSH, T4TOTAL, FREET4, T3FREE, THYROIDAB in the last 72 hours. Anemia  Panel: No results for input(s): VITAMINB12, FOLATE, FERRITIN, TIBC, IRON, RETICCTPCT in the last 72 hours. Sepsis Labs: Recent Labs  Lab 03/30/20 1058  LATICACIDVEN 1.4    Recent Results (from the past 240 hour(s))  Culture, blood (Routine x 2)     Status: None (Preliminary result)   Collection Time: 03/30/20 10:58 AM   Specimen: BLOOD  Result Value Ref Range Status   Specimen Description BLOOD RIGHT HAND  Final   Special Requests   Final    BOTTLES DRAWN AEROBIC AND ANAEROBIC Blood Culture adequate volume   Culture   Final    NO GROWTH < 24 HOURS Performed at [facility], [address], [place], [place] [address]    Report Status PENDING  Incomplete  SARS Coronavirus 2 by RT PCR (hospital order, performed in [place] hospital lab) Nasopharyngeal Nasopharyngeal Swab     Status: None   Collection Time: 03/30/20  6:07 PM   Specimen: Nasopharyngeal Swab  Result Value Ref Range Status   SARS Coronavirus 2 NEGATIVE NEGATIVE Final    Comment: (NOTE) SARS-CoV-2 target nucleic acids are NOT DETECTED.  The SARS-CoV-2 RNA is generally detectable in upper and lower respiratory specimens during the acute phase of infection. The lowest concentration of SARS-CoV-2 viral copies this assay can detect is 250 copies / mL. A negative result does not preclude SARS-CoV-2 infection and should not be used as the sole basis for treatment or other patient management decisions.  A negative result may occur with improper specimen collection / handling, submission of specimen other than nasopharyngeal swab, presence of viral mutation(s) within the areas targeted by this assay, and inadequate number of viral copies (<250 copies / mL). A negative result must be combined with clinical observations, patient history, and epidemiological information.  Fact Sheet for Patients:   StrictlyIdeas.no  Fact Sheet for Healthcare  Providers: BankingDealers.co.za  This test is not yet approved or  cleared by the [place] FDA and has been authorized for detection and/or diagnosis of SARS-CoV-2 by FDA under an Emergency Use Authorization (EUA).  This EUA will remain in effect (meaning this test can be used) for the duration of the COVID-19 declaration under Section 564(b)(1) of the Act, 21 U.S.C. section 360bbb-3(b)(1), unless the authorization is terminated or revoked sooner.  Performed at [facility], [address] [place], [place] [address]   MRSA PCR Screening     Status: None   Collection Time: 03/31/20  4:03 AM   Specimen: Nasopharyngeal  Result Value Ref Range Status   MRSA by PCR NEGATIVE NEGATIVE Final    Comment:  The GeneXpert MRSA Assay (FDA approved for NASAL specimens only), is one component of a comprehensive MRSA colonization surveillance program. It is not intended to diagnose MRSA infection nor to guide or monitor treatment for MRSA infections. Performed at [facility], [address], [place], [place] [address]          Radiology Studies: DG Chest 1 View  Result Date: 03/30/2020 CLINICAL DATA:  Sepsis, toe infection EXAM: CHEST  1 VIEW COMPARISON:  12/23/2018 FINDINGS: Stable cardiomegaly. Chronic bilateral interstitial prominence. No focal airspace consolidation, pleural effusion, or pneumothorax. Bones appear demineralized. IMPRESSION: Cardiomegaly with chronic bilateral interstitial prominence. No focal airspace consolidation. Electronically Signed   By: Davina Poke D.O.   On: 03/30/2020 11:38   DG Foot Complete Left  Result Date: 03/30/2020 CLINICAL DATA:  Sepsis.  Second toe infection. EXAM: LEFT FOOT - COMPLETE 3+ VIEW COMPARISON:  None. FINDINGS: Diffuse osteopenia is noted. 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 Vascular calcifications are noted. No fracture or dislocation is noted. No definite  lytic destruction is seen to suggest acute osteomyelitis. IMPRESSION: No definite lytic destruction is seen to suggest acute osteomyelitis. Electronically Signed   By: Marijo Conception M.D.   On: 03/30/2020 11:41        Scheduled Meds: . amLODipine  10 mg Oral Daily  . aspirin EC  81 mg Oral Daily  . atorvastatin  20 mg Oral Daily  . budesonide  0.25 mg Nebulization BID  . carvedilol  6.25 mg Oral BID WC  . clopidogrel  75 mg Oral Q breakfast  . docusate sodium  100 mg Oral BID  . enoxaparin (LOVENOX) injection  40 mg Subcutaneous Q24H  . folic acid  1 mg Oral Daily  . gabapentin  300 mg Oral QPM  . hydrALAZINE  25 mg Oral Q8H  . insulin aspart  0-15 Units Subcutaneous TID WC  . insulin aspart  0-5 Units Subcutaneous QHS  . insulin detemir  10 Units Subcutaneous QHS  . ipratropium-albuterol  3 mL Nebulization BID  . isosorbide dinitrate  30 mg Oral Daily  . loratadine  10 mg Oral Daily  . magnesium oxide  400 mg Oral BID  . modafinil  200 mg Oral Daily  . pantoprazole  40 mg Oral Daily  . torsemide  20 mg Oral BID   Continuous Infusions: . sodium chloride 75 mL/hr at 03/30/20 2312  . cefTRIAXone (ROCEPHIN)  IV 1 g (03/31/20 1403)  . metronidazole    . vancomycin 500 mg (03/31/20 0737)     LOS: 1 day    Time spent: 35 mins    Jayra Choyce, MD [facility]   If 7PM-7AM, please contact night-coverage

## 2020-03-31 NOTE — Consult Note (Addendum)
[facility] Nurse Consult Note:  Reason for Consult: Consult requested for foot wounds.   Dr Vickki Muff of the podiatry service has already performed a consult last evening and indicates that pt has gangrenous toes.  This complicated medical condition is beyond the scope of practice for [facility] nurses.  His note indicates, "Will await vascular consult.  Pt without pulses and leg is cool. Concerned for any attempts at limb salvage till evaluated.  Pt high risk for amputation based on clinical appearance." Please refer to vascular team for further plan of care.  Please re-consult if further assistance is needed.  Thank-you,  Julien Girt MSN, [place], [place], [place], [place]

## 2020-04-01 LAB — COMPREHENSIVE METABOLIC PANEL
ALT: 8 U/L (ref 0–44)
AST: 14 U/L — ABNORMAL LOW (ref 15–41)
Albumin: 2.8 g/dL — ABNORMAL LOW (ref 3.5–5.0)
Alkaline Phosphatase: 54 U/L (ref 38–126)
Anion gap: 8 (ref 5–15)
BUN: 13 mg/dL (ref 8–23)
CO2: 26 mmol/L (ref 22–32)
Calcium: 8.8 mg/dL — ABNORMAL LOW (ref 8.9–10.3)
Chloride: 104 mmol/L (ref 98–111)
Creatinine, Ser: 0.51 mg/dL (ref 0.44–1.00)
GFR calc Af Amer: >60 mL/min (ref >60)
GFR calc non Af Amer: >60 mL/min (ref >60)
Glucose, Bld: 162 mg/dL — ABNORMAL HIGH (ref 70–99)
Potassium: 4 mmol/L (ref 3.5–5.1)
Sodium: 138 mmol/L (ref 135–145)
Total Bilirubin: 0.6 mg/dL (ref 0.3–1.2)
Total Protein: 5.7 g/dL — ABNORMAL LOW (ref 6.5–8.1)

## 2020-04-01 LAB — CBC
HCT: 33.3 % — ABNORMAL LOW (ref 36.0–46.0)
Hemoglobin: 10.7 g/dL — ABNORMAL LOW (ref 12.0–15.0)
MCH: 27.6 pg (ref 26.0–34.0)
MCHC: 32.1 g/dL (ref 30.0–36.0)
MCV: 85.8 fL (ref 80.0–100.0)
Platelets: 253 10*3/uL (ref 150–400)
RBC: 3.88 MIL/uL (ref 3.87–5.11)
RDW: 14.7 % (ref 11.5–15.5)
WBC: 9.2 10*3/uL (ref 4.0–10.5)
nRBC: 0 % (ref 0.0–0.2)

## 2020-04-01 LAB — MAGNESIUM: Magnesium: 1.9 mg/dL (ref 1.7–2.4)

## 2020-04-01 LAB — GLUCOSE, CAPILLARY
Glucose-Capillary: 143 mg/dL — ABNORMAL HIGH (ref 70–99)
Glucose-Capillary: 143 mg/dL — ABNORMAL HIGH (ref 70–99)
Glucose-Capillary: 139 mg/dL — ABNORMAL HIGH (ref 70–99)
Glucose-Capillary: 91 mg/dL (ref 70–99)

## 2020-04-01 LAB — PHOSPHORUS: Phosphorus: 3.5 mg/dL (ref 2.5–4.6)

## 2020-04-01 LAB — BRAIN NATRIURETIC PEPTIDE: B Natriuretic Peptide: 135.5 pg/mL — ABNORMAL HIGH (ref 0.0–100.0)

## 2020-04-01 NOTE — Plan of Care (Signed)
°  Problem: Education: Goal: Knowledge of General Education information will improve Description: Including pain rating scale, medication(s)/side effects and non-pharmacologic comfort measures Outcome: Progressing   Problem: Health Behavior/Discharge Planning: Goal: Ability to manage health-related needs will improve Outcome: Progressing   Problem: Clinical Measurements: Goal: Ability to maintain clinical measurements within normal limits will improve Outcome: Progressing Goal: Will remain free from infection Outcome: Progressing Goal: Diagnostic test results will improve Outcome: Progressing Goal: Respiratory complications will improve Outcome: Progressing Goal: Cardiovascular complication will be avoided Outcome: Progressing   Problem: Activity: Goal: Risk for activity intolerance will decrease Outcome: Progressing   Problem: Nutrition: Goal: Adequate nutrition will be maintained Outcome: Progressing   Problem: Coping: Goal: Level of anxiety will decrease Outcome: Progressing   Problem: Elimination: Goal: Will not experience complications related to bowel motility Outcome: Progressing Goal: Will not experience complications related to urinary retention Outcome: Progressing   Problem: Pain Managment: Goal: General experience of comfort will improve Outcome: Progressing   Problem: Safety: Goal: Ability to remain free from injury will improve Outcome: Progressing   Problem: Skin Integrity: Goal: Risk for impaired skin integrity will decrease Outcome: Progressing  DISCUSSED POC WITH PT SPOUSE - PT ALERT AND PLEASANTLY CONFUSED THIS AM

## 2020-04-01 NOTE — Progress Notes (Signed)
PROGRESS NOTE    [name]  UGQ:[id number] DOB: 1941/03/11 DOA: 03/30/2020 PCP: [name], MD   Brief Narrative:  [name] is a 79 y.o. female with medical history significant of diastolic congestive heart failure, diabetes mellitus, diabetic neuropathy, hypertension, hyperlipidemia, GERD, peripheral vascular disease, CVA presents to the ED at the request of her primary care physician for evaluation of a black necrotic left second toe with presumed cellulitis.  The majority the history was gained by reading the medical record and speaking with the ED physician  Last documentation in care everywhere was from 2019.  No updated documentation but apparently she is known to [facility].  It is unclear exactly how long her symptoms been going on or how long her toes look black. Her story is rather inconsistent.  In the emergency department she was given 1 dose of vancomycin and clindamycin because of numerous antibiotic allergies/intolerances.  Hospitalist contacted for admission. Apparently the toes been black for the last 3 to 4 days.  No osteomyelitis was noted on imaging.  Patient is admitted for necrotic left foot toes with superimposed cellulitis,  Patient is started on IV antibiotics,  Patient was seen by podiatry,  recommended patient needs vascular evaluation. Patient high risk for amputation based on clinical appearance.  Patient eventually need amputation of those necrotic toes.  Vascular surgery recommended left lower extremity angiogram,  possible intervention on Monday.   Assessment & Plan:   Active Problems:   Infected wound   Necrotic left second toe with superimposed cellulitis Exact timeframe is unknown. Patient states ~been black for 3 to 4 days, we suspect longer. Sensation decreased,  Patient is known to podiatry as well as [facility].  No osteomyelitis noted on plain film imaging. Clinically not septic by  criteria.  Plan: Admit inpatient, MedSurg Empiric vancomycin IV, pharmacy dosing Clindamycin 600 mg IV every 8 hours. DC due to risk of C .Diff Continue  IVF  Gentle hydration. Follow cultures taken on admission As needed pain control WOC consult : out of scope of practice. Vascular consulted ,  Dr. Lucky Cowboy recommended the left lower extremity angiogram,  possible intervention on Monday Podiatry consulted,   Dr. Vickki Muff states patient at high risk for amputation given clinical situation. Suspect patient will require surgical intervention/amputation Appreciate surgical recommendations  Chronic diastolic congestive heart failure Does not appear acutely exacerbated. Continue goal-directed medical therapy Continue Coreg 6.25 twice daily Continue Demadex 20 twice daily No lisinopril noted on home med rec.  Peripheral vascular disease History of CVA. Continue dual antiplatelet therapy with aspirin and Plavix.  Essential hypertension Continue Coreg 6.25 twice daily Continue amlodipine 10 mg daily Continue hydralazine 25 mg p.o. every 8 hours As needed hydralazine  Insulin-dependent diabetes mellitus Diabetic neuropathy Home Lantus dose 18 units daily at bedtime Plan: Start Lantus 18 units nightly Moderate sliding scale Carb modified diet Check A1c Continue gabapentin/pregabalin Consider diabetes coordinator consult if glycemic control becomes difficult  GERD PPI  COPD Appears baseline Not acutely exacerbated Continue home nebulizer/inhaler regimen   DVT prophylaxis: Lovenox Code Status: Full Family Communication: Husband at bedside Disposition Plan: Anticipate return to previous home environment Consults called: Podiatry: Vickki Muff; vascular: Dew Patient is not medically clear requiring possible amputation and further treatment.    Consultants:    Podiatry, Wound care and Vascular surgery.  Procedures:  Antimicrobials:  Anti-infectives (From admission,  onward)   Start     Dose/Rate Route Frequency Ordered Stop  03/31/20 1400  metroNIDAZOLE (FLAGYL) IVPB 500 mg        500 mg 100 mL/hr over 60 Minutes Intravenous Every 8 hours 03/31/20 1043     03/31/20 1200  cefTRIAXone (ROCEPHIN) 1 g in sodium chloride 0.9 % 100 mL IVPB        1 g 200 mL/hr over 30 Minutes Intravenous Daily 03/31/20 1043     03/31/20 0600  vancomycin (VANCOREADY) IVPB 500 mg/100 mL        500 mg 100 mL/hr over 60 Minutes Intravenous Every 12 hours 03/30/20 1917     03/30/20 1615  cefTRIAXone (ROCEPHIN) 1 g in sodium chloride 0.9 % 100 mL IVPB  Status:  Discontinued        1 g 200 mL/hr over 30 Minutes Intravenous Every 24 hours 03/30/20 1607 03/30/20 1706   03/30/20 1615  clindamycin (CLEOCIN) IVPB 600 mg  Status:  Discontinued        600 mg 100 mL/hr over 30 Minutes Intravenous Every 8 hours 03/30/20 1608 03/30/20 1706   03/30/20 1600  vancomycin (VANCOCIN) IVPB 1000 mg/200 mL premix        1,000 mg 200 mL/hr over 60 Minutes Intravenous  Once 03/30/20 1556 03/30/20 2243   03/30/20 1600  clindamycin (CLEOCIN) IVPB 600 mg  Status:  Discontinued        600 mg 100 mL/hr over 30 Minutes Intravenous Every 8 hours 03/30/20 1556 03/31/20 1043     Subjective: Patient was seen and examined at bedside.  No overnight events.  She she appears much alert, oriented tells her name, denies any pain.  Objective: Vitals:   03/31/20 2015 03/31/20 2333 04/01/20 0743 04/01/20 0942  BP:  (!) 145/50  (!) 142/59  Pulse:  72  79  Resp:  18  16  Temp:  97.7 F (36.5 C)  98.1 F (36.7 C)  TempSrc:  Oral  Oral  SpO2: 97% 99% 97% 99%  Weight:      Height:        Intake/Output Summary (Last 24 hours) at 04/01/2020 1204 Last data filed at 04/01/2020 0947 Gross per 24 hour  Intake 1706.25 ml  Output 1450 ml  Net 256.25 ml   Filed Weights   03/30/20 1659  Weight: 66.2 kg    Examination:  General exam: Appears calm and comfortable, lethargic arousable to sternal  rub. Respiratory system: Clear to auscultation. Respiratory effort normal. Cardiovascular system: S1 & S2 heard, RRR. No JVD, murmurs, rubs, gallops or clicks. No pedal edema. Gastrointestinal system: Abdomen is nondistended, soft and nontender. No organomegaly or masses felt. Normal bowel sounds heard. Central nervous system: Alert and oriented. No focal neurological deficits. Extremities:  Left LE: cold, gangrenous toes noted. Skin: No rashes, lesions or ulcers Psychiatry: Judgement and insight appear normal. Mood & affect appropriate.     Data Reviewed: I have personally reviewed following labs and imaging studies  CBC: Recent Labs  Lab 03/30/20 1058 03/31/20 0634 04/01/20 0615  WBC 13.9* 9.4 9.2  NEUTROABS 10.2*  --   --   HGB 12.0 10.8* 10.7*  HCT 37.6 32.8* 33.3*  MCV 83.6 83.0 85.8  PLT 305 284 096   Basic Metabolic Panel: Recent Labs  Lab 03/30/20 1058 03/31/20 0634 04/01/20 0615  NA 136 141 138  K 4.3 4.1 4.0  CL 99 103 104  CO2 30 28 26   GLUCOSE 265* 243* 162*  BUN 13 13 13   CREATININE 0.54 0.69 0.51  CALCIUM 9.3 9.4 8.8*  MG  --   --  1.9  PHOS  --   --  3.5   GFR: Estimated Creatinine Clearance: 53 mL/min (by C-G formula based on SCr of 0.51 mg/dL). Liver Function Tests: Recent Labs  Lab 03/30/20 1058 03/31/20 0634 04/01/20 0615  AST 17 13* 14*  ALT 13 10 8   ALKPHOS 69 60 54  BILITOT 0.6 0.9 0.6  PROT 6.6 5.8* 5.7*  ALBUMIN 3.2* 2.9* 2.8*   No results for input(s): LIPASE, AMYLASE in the last 168 hours. No results for input(s): AMMONIA in the last 168 hours. Coagulation Profile: Recent Labs  Lab 03/30/20 2238 03/31/20 0634  INR 1.1 1.1   Cardiac Enzymes: Recent Labs  Lab 03/30/20 1058  CKTOTAL 45   BNP (last 3 results) No results for input(s): PROBNP in the last 8760 hours. HbA1C: Recent Labs    03/30/20 1722  HGBA1C 10.2*   CBG: Recent Labs  Lab 03/31/20 0802 03/31/20 1157 03/31/20 1707 03/31/20 2134 04/01/20 0826   GLUCAP 222* 238* 273* 170* 143*   Lipid Profile: No results for input(s): CHOL, HDL, LDLCALC, TRIG, CHOLHDL, LDLDIRECT in the last 72 hours. Thyroid Function Tests: No results for input(s): TSH, T4TOTAL, FREET4, T3FREE, THYROIDAB in the last 72 hours. Anemia Panel: No results for input(s): VITAMINB12, FOLATE, FERRITIN, TIBC, IRON, RETICCTPCT in the last 72 hours. Sepsis Labs: Recent Labs  Lab 03/30/20 1058  LATICACIDVEN 1.4    Recent Results (from the past 240 hour(s))  Culture, blood (Routine x 2)     Status: None (Preliminary result)   Collection Time: 03/30/20 10:58 AM   Specimen: BLOOD  Result Value Ref Range Status   Specimen Description BLOOD RIGHT HAND  Final   Special Requests   Final    BOTTLES DRAWN AEROBIC AND ANAEROBIC Blood Culture adequate volume   Culture   Final    NO GROWTH < 24 HOURS Performed at [facility], [address], [place], [place] [address]    Report Status PENDING  Incomplete  SARS Coronavirus 2 by RT PCR (hospital order, performed in [place] hospital lab) Nasopharyngeal Nasopharyngeal Swab     Status: None   Collection Time: 03/30/20  6:07 PM   Specimen: Nasopharyngeal Swab  Result Value Ref Range Status   SARS Coronavirus 2 NEGATIVE NEGATIVE Final    Comment: (NOTE) SARS-CoV-2 target nucleic acids are NOT DETECTED.  The SARS-CoV-2 RNA is generally detectable in upper and lower respiratory specimens during the acute phase of infection. The lowest concentration of SARS-CoV-2 viral copies this assay can detect is 250 copies / mL. A negative result does not preclude SARS-CoV-2 infection and should not be used as the sole basis for treatment or other patient management decisions.  A negative result may occur with improper specimen collection / handling, submission of specimen other than nasopharyngeal swab, presence of viral mutation(s) within the areas targeted by this assay, and inadequate number of viral copies (<250 copies /  mL). A negative result must be combined with clinical observations, patient history, and epidemiological information.  Fact Sheet for Patients:   StrictlyIdeas.no  Fact Sheet for Healthcare Providers: BankingDealers.co.za  This test is not yet approved or  cleared by the [place] FDA and has been authorized for detection and/or diagnosis of SARS-CoV-2 by FDA under an Emergency Use Authorization (EUA).  This EUA will remain in effect (meaning this test can be used) for the duration of the COVID-19 declaration under Section 564(b)(1) of the Act, 21 U.S.C. section  360bbb-3(b)(1), unless the authorization is terminated or revoked sooner.  Performed at [facility], [address] [place], [place] [address]   MRSA PCR Screening     Status: None   Collection Time: 03/31/20  4:03 AM   Specimen: Nasopharyngeal  Result Value Ref Range Status   MRSA by PCR NEGATIVE NEGATIVE Final    Comment:        The GeneXpert MRSA Assay (FDA approved for NASAL specimens only), is one component of a comprehensive MRSA colonization surveillance program. It is not intended to diagnose MRSA infection nor to guide or monitor treatment for MRSA infections. Performed at [facility], [address] [place], [place] [address]          Radiology Studies: No results found.      Scheduled Meds: . amLODipine  10 mg Oral Daily  . aspirin EC  81 mg Oral Daily  . atorvastatin  20 mg Oral Daily  . budesonide  0.25 mg Nebulization BID  . carvedilol  6.25 mg Oral BID WC  . clopidogrel  75 mg Oral Q breakfast  . docusate sodium  100 mg Oral BID  . enoxaparin (LOVENOX) injection  40 mg Subcutaneous Q24H  . folic acid  1 mg Oral Daily  . gabapentin  300 mg Oral QPM  . hydrALAZINE  25 mg Oral Q8H  . insulin aspart  0-15 Units Subcutaneous TID WC  . insulin aspart  0-5 Units Subcutaneous QHS  . insulin detemir  10 Units  Subcutaneous QHS  . ipratropium-albuterol  3 mL Nebulization BID  . isosorbide dinitrate  30 mg Oral Daily  . loratadine  10 mg Oral Daily  . magnesium oxide  400 mg Oral BID  . modafinil  200 mg Oral Daily  . pantoprazole  40 mg Oral Daily  . torsemide  20 mg Oral BID   Continuous Infusions: . sodium chloride 75 mL/hr at 04/01/20 0530  . cefTRIAXone (ROCEPHIN)  IV 1 g (03/31/20 1403)  . metronidazole 500 mg (04/01/20 7425)  . vancomycin 500 mg (04/01/20 0533)     LOS: 2 days    Time spent: 25 mins    Jemina Scahill, MD [facility]   If 7PM-7AM, please contact night-coverage

## 2020-04-01 NOTE — Consult Note (Signed)
Vascular surgery progress note  MRN : [id number]  [name] is a 79 y.o. (Sep 23, 1940) female who presents with chief complaint of  Chief Complaint  Patient presents with  . Toe Pain   History of Present Illness:   [name] is a 79 year old female with medical history significant for diastolic congestive heart failure, diabetes mellitus, diabetic neuropathy, hypertension, hyperlipidemia, GERD, peripheral vascular disease, CVA presents to the ED at the request of her primary care physician for evaluation of a black necrotic left second toe with presumed cellulitis.    Last endovascular intervention on June 08, 2019: 1. Introduction catheter into left lower extremity 3rd order catheter placement  2. Contrast injection left lower extremity for distal runoff  3. Percutaneous transluminal angioplasty left superficial femoral and popliteal arteries to 4 mm with Lutonix drug-eluting balloon 4. Percutaneous transluminal angioplasty left anterior tibial to 3 mm with an ultra score balloon  5. 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 Poor historian      Physical Examination  Vitals:   03/31/20 1709 03/31/20 2015 03/31/20 2333 04/01/20 0743  BP: (!) 124/53  (!) 145/50   Pulse: 69  72   Resp: 16  18   Temp: 97.9 F (36.6 C)  97.7 F (36.5 C)   TempSrc: Oral  Oral   SpO2: 96% 97% 99% 97%  Weight:      Height:       Body mass index is 24.3 kg/m. Gen:  barely answers to name, NAD  Vascular:  Vessel Right Left  Radial Palpable Palpable  Ulnar Palpable Palpable  Brachial Palpable Palpable  Carotid Palpable, without bruit Palpable, without bruit  Aorta Not palpable N/A  Femoral Palpable Palpable  Popliteal Palpable Palpable  PT Non-Palpable Non-Palpable  DP  Non-Palpable Non-Palpable   Left lower extremity: Second toe with dry gangrene.  Previous amputation site for the first and third toes are healed.  Unable to palpate pedal pulses.  Thigh soft.  Calf soft.  Extremity is warm distally toes transitioned to cooler at the remaining toes.  CBC Lab Results  Component Value Date   WBC 9.2 04/01/2020   HGB 10.7 (L) 04/01/2020   HCT 33.3 (L) 04/01/2020   MCV 85.8 04/01/2020   PLT 253 04/01/2020   BMET    Component Value Date/Time   NA 138 04/01/2020 0615   K 4.0 04/01/2020 0615   CL 104 04/01/2020 0615   CO2 26 04/01/2020 0615   GLUCOSE 162 (H) 04/01/2020 0615   BUN 13 04/01/2020 0615   CREATININE 0.51 04/01/2020 0615   CALCIUM 8.8 (L) 04/01/2020 0615   GFRNONAA >60 04/01/2020 0615   GFRAA >60 04/01/2020 0615   Estimated Creatinine Clearance: 53 mL/min (by C-G formula based on SCr of 0.51 mg/dL).  COAG Lab Results  Component Value Date   INR 1.1 03/31/2020   INR 1.1 03/30/2020   INR 1.1 06/09/2019   Radiology DG Chest 1 View  Result Date: 03/30/2020 CLINICAL DATA:  Sepsis, toe infection EXAM: CHEST  1 VIEW COMPARISON:  12/23/2018 FINDINGS: Stable cardiomegaly. Chronic bilateral interstitial prominence. No focal airspace consolidation, pleural effusion, or pneumothorax. Bones appear demineralized. IMPRESSION: Cardiomegaly with chronic bilateral interstitial prominence. No focal airspace consolidation. Electronically Signed   By: Davina Poke D.O.   On: 03/30/2020 11:38   DG Foot Complete  Left  Result Date: 03/30/2020 CLINICAL DATA:  Sepsis.  Second toe infection. EXAM: LEFT FOOT - COMPLETE 3+ VIEW COMPARISON:  None. FINDINGS: Diffuse osteopenia is noted. 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 Vascular calcifications are noted. No fracture or dislocation is noted. No definite lytic destruction is seen to suggest acute osteomyelitis. IMPRESSION: No definite lytic destruction is seen to suggest acute  osteomyelitis. Electronically Signed   By: Marijo Conception M.D.   On: 03/30/2020 11:41   Assessment/Plan The patient is a 79 year old female with multiple medical issues well-known to our service as we have treated her for her atherosclerotic disease to the left lower extremity in the past requiring endovascular intervention amputation of the first and third toes  1.  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 Unable to palpate pedal pulses in the setting of known peripheral artery disease and multiple risk factors recommend undergoing a left lower extremity angiogram possible intervention and attempt assess the patient's anatomy and contributing degree of atherosclerotic disease.  Appropriate, an attempt to revascularize electively at that time we will plan on a left lower extremity angiogram possible intervention on Monday with Dr. Lucky Cowboy  2.  Diabetes: On appropriate medications Encouraged good control as its slows the progression of atherosclerotic disease.  3.  Hyperlipidemia: On Aspirin, Plavix and Statin for medical management. Encouraged good control as its slows the progression of atherosclerotic disease.  Richardson Dopp, MD

## 2020-04-02 LAB — VANCOMYCIN, TROUGH: Vancomycin Tr: 14 ug/mL — ABNORMAL LOW (ref 15–20)

## 2020-04-02 LAB — BASIC METABOLIC PANEL
Anion gap: 11 (ref 5–15)
BUN: 11 mg/dL (ref 8–23)
CO2: 23 mmol/L (ref 22–32)
Calcium: 8.6 mg/dL — ABNORMAL LOW (ref 8.9–10.3)
Chloride: 104 mmol/L (ref 98–111)
Creatinine, Ser: 0.53 mg/dL (ref 0.44–1.00)
GFR calc Af Amer: >60 mL/min (ref >60)
GFR calc non Af Amer: >60 mL/min (ref >60)
Glucose, Bld: 147 mg/dL — ABNORMAL HIGH (ref 70–99)
Potassium: 3.3 mmol/L — ABNORMAL LOW (ref 3.5–5.1)
Sodium: 138 mmol/L (ref 135–145)

## 2020-04-02 LAB — CBC
HCT: 32.6 % — ABNORMAL LOW (ref 36.0–46.0)
Hemoglobin: 10.3 g/dL — ABNORMAL LOW (ref 12.0–15.0)
MCH: 26.8 pg (ref 26.0–34.0)
MCHC: 31.6 g/dL (ref 30.0–36.0)
MCV: 84.9 fL (ref 80.0–100.0)
Platelets: 279 10*3/uL (ref 150–400)
RBC: 3.84 MIL/uL — ABNORMAL LOW (ref 3.87–5.11)
RDW: 14.8 % (ref 11.5–15.5)
WBC: 9.6 10*3/uL (ref 4.0–10.5)
nRBC: 0 % (ref 0.0–0.2)

## 2020-04-02 LAB — GLUCOSE, CAPILLARY
Glucose-Capillary: 169 mg/dL — ABNORMAL HIGH (ref 70–99)
Glucose-Capillary: 145 mg/dL — ABNORMAL HIGH (ref 70–99)
Glucose-Capillary: 146 mg/dL — ABNORMAL HIGH (ref 70–99)
Glucose-Capillary: 190 mg/dL — ABNORMAL HIGH (ref 70–99)

## 2020-04-02 MED ORDER — VANCOMYCIN HCL 750 MG/150ML IV SOLN
750.0000 mg | Freq: Two times a day (BID) | INTRAVENOUS | Status: DC
  Administered 2020-04-02 – 2020-04-05 (×6): 750 mg via INTRAVENOUS
  Filled 2020-04-02 (×7): qty 150

## 2020-04-02 MED ORDER — IPRATROPIUM-ALBUTEROL 0.5-2.5 (3) MG/3ML IN SOLN: 3.0000 mL | Freq: Four times a day (QID) | RESPIRATORY_TRACT | Status: DC | PRN

## 2020-04-02 MED ORDER — POTASSIUM CHLORIDE CRYS ER 20 MEQ PO TBCR
40.0000 meq | EXTENDED_RELEASE_TABLET | Freq: Once | ORAL | Status: AC
  Administered 2020-04-02: 40 meq via ORAL
  Filled 2020-04-02: qty 2

## 2020-04-02 NOTE — Progress Notes (Addendum)
Pharmacy Antibiotic Note  [name] is a 79 y.o. female with history of diabetes and peripheral vascular disease admitted on 03/30/2020 with gangrenous left second toe.  Pharmacy has been consulted for vancomycin dosing. Patient is also on ceftriaxone and metronidazole. Vascular has been consulted for potential revascularization procedure. Podiatry also following. Patient is at high risk for amputation.   Today, 04/02/20  --Day # 4 antibiotics --Patient is afebrile, WBC stable at 9.6 --Scr trending up (0.54 >> 0.69 >> 0.53)  Plan: Vancomycin trough was 14 (~12 hour post dose), slightly below goal range. Goal trough 15-20. Will adjust vancomycin dose to 750 mg q12H.  Plan to order vancomycin trough in 2-3 days.    Height: 5\' 5"  (165.1 cm) Weight: 66.2 kg (146 lb) IBW/kg (Calculated) : 57  Temp (24hrs), Avg:97.9 F (36.6 C), Min:97.7 F (36.5 C), Max:98 F (36.7 C)  Recent Labs  Lab 03/30/20 1058 03/31/20 0634 04/01/20 0615 04/02/20 0605  WBC 13.9* 9.4 9.2 9.6  CREATININE 0.54 0.69 0.51 0.53  LATICACIDVEN 1.4  --   --   --   VANCOTROUGH  --   --   --  14*    Estimated Creatinine Clearance: 53 mL/min (by C-G formula based on SCr of 0.53 mg/dL).    Allergies  Allergen Reactions  . Iodine Anaphylaxis  . Penicillins Anaphylaxis    Tolerates ceftriaxone, cefazolin Did it involve swelling of the face/tongue/throat, SOB, or low BP? Unknown Did it involve sudden or severe rash/hives, skin peeling, or any reaction on the inside of your mouth or nose? Unknown Did you need to seek medical attention at a hospital or doctor's office? Unknown When did it last happen?Unknown If all above answers are "NO", may proceed with cephalosporin use.   . Shellfish Allergy Anaphylaxis  . Sulfa Antibiotics Anaphylaxis  . Sulfacetamide Sodium Anaphylaxis  . Sulfasalazine Anaphylaxis  . Eggs Or Egg-Derived Products     Other reaction(s): SHORTNESS OF BREATH  . Morphine And Related Other  (See Comments)    Altered mental status    Antimicrobials this admission: 8/26 clindamycin  >> 8/27 8/26 vancomycin >>  8/27 ceftriaxone >> 8/27 metronidazole >>  Microbiology results: 8/26 BCx: NGTD  Thank you for allowing pharmacy to be a part of this patient's care.  Oswald Hillock, PharmD, BCPS 04/02/2020 1:29 PM

## 2020-04-02 NOTE — Consult Note (Signed)
Vascular surgery progress note  MRN : [id number]  [name] is a 79 y.o. (1940-12-21) female who presents with chief complaint of  Chief Complaint  Patient presents with  . Toe Pain   History of Present Illness:   Patient states she has pain in left leg. Unable to communicate well.  Physical Examination  Vitals:   04/01/20 1732 04/02/20 0009 04/02/20 0523 04/02/20 0820  BP: (!) 134/53 (!) 124/48 (!) 132/47 (!) 189/77  Pulse: 75 78  81  Resp: 17 18  17   Temp: 98 F (36.7 C) 97.7 F (36.5 C)  97.9 F (36.6 C)  TempSrc: Oral Oral  Oral  SpO2: 99% 99%  98%  Weight:      Height:       Body mass index is 24.3 kg/m. Gen:  barely answers to name, NAD  Vascular:  Vessel Right Left  Radial Palpable Palpable  Ulnar Palpable Palpable  Brachial Palpable Palpable  Carotid Palpable, without bruit Palpable, without bruit  Aorta Not palpable N/A  Femoral Palpable Palpable  Popliteal Palpable Palpable  PT monophasic monophasic  DP monophasic monophasic   Left lower extremity: Second toe with dry gangrene.  Previous amputation site for the first and third toes are healed.  Unable to palpate pedal pulses.  Thigh soft.  Calf soft.  Extremity is warm distally toes transitioned to cooler at the remaining toes.  CBC Lab Results  Component Value Date   WBC 9.6 04/02/2020   HGB 10.3 (L) 04/02/2020   HCT 32.6 (L) 04/02/2020   MCV 84.9 04/02/2020   PLT 279 04/02/2020   BMET    Component Value Date/Time   NA 138 04/02/2020 0605   K 3.3 (L) 04/02/2020 0605   CL 104 04/02/2020 0605   CO2 23 04/02/2020 0605   GLUCOSE 147 (H) 04/02/2020 0605   BUN 11 04/02/2020 0605   CREATININE 0.53 04/02/2020 0605   CALCIUM 8.6 (L) 04/02/2020 0605   GFRNONAA >60 04/02/2020 0605   GFRAA >60 04/02/2020 4709   Estimated Creatinine Clearance: 53 mL/min (by C-G formula based on SCr of 0.53 mg/dL).  COAG Lab Results  Component Value Date   INR 1.1 03/31/2020   INR 1.1 03/30/2020   INR 1.1  06/09/2019   Radiology DG Chest 1 View  Result Date: 03/30/2020 CLINICAL DATA:  Sepsis, toe infection EXAM: CHEST  1 VIEW COMPARISON:  12/23/2018 FINDINGS: Stable cardiomegaly. Chronic bilateral interstitial prominence. No focal airspace consolidation, pleural effusion, or pneumothorax. Bones appear demineralized. IMPRESSION: Cardiomegaly with chronic bilateral interstitial prominence. No focal airspace consolidation. Electronically Signed   By: Davina Poke D.O.   On: 03/30/2020 11:38   DG Foot Complete Left  Result Date: 03/30/2020 CLINICAL DATA:  Sepsis.  Second toe infection. EXAM: LEFT FOOT - COMPLETE 3+ VIEW COMPARISON:  None. FINDINGS: Diffuse osteopenia is noted. 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 Vascular calcifications are noted. No fracture or dislocation is noted. No definite lytic destruction is seen to suggest acute osteomyelitis. IMPRESSION: No definite lytic destruction is seen to suggest acute osteomyelitis. Electronically Signed   By: Marijo Conception M.D.   On: 03/30/2020 11:41   Assessment/Plan  1.  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 Lucky Cowboy  2.  Diabetes: On appropriate medications Encouraged good control as its slows the progression  of atherosclerotic disease.  3.  Hyperlipidemia: On Aspirin, Plavix and Statin for medical management. Encouraged good control as its slows the progression of atherosclerotic disease.  Richardson Dopp, MD

## 2020-04-02 NOTE — Progress Notes (Signed)
PROGRESS NOTE    [name]  ZOX:[id number] DOB: [birth year] DOA: 03/30/2020 PCP: [name], MD   Brief Narrative:  [name] is a 79 y.o. female with medical history significant of diastolic congestive heart failure, diabetes mellitus, diabetic neuropathy, hypertension, hyperlipidemia, GERD, peripheral vascular disease, CVA presents to the ED at the request of her primary care physician for evaluation of a black necrotic left second toe with presumed cellulitis.  The majority the history was gained by reading the medical record and speaking with the ED physician  Last documentation in care everywhere was from 2019.  No updated documentation but apparently she is known to [facility].  It is unclear exactly how long her symptoms been going on or how long her toes look black. Her story is rather inconsistent.  In the emergency department she was given 1 dose of vancomycin and clindamycin because of numerous antibiotic allergies/intolerances.  Hospitalist contacted for admission. Apparently the toes been black for the last 3 to 4 days.  No osteomyelitis was noted on imaging.  Patient is admitted for necrotic left foot toes with superimposed cellulitis,  Patient is started on IV antibiotics,  Patient was seen by podiatry,  recommended patient needs vascular evaluation. Patient high risk for amputation based on clinical appearance.  Patient eventually need amputation of those necrotic toes.  Vascular surgery recommended left lower extremity angiogram,  possible intervention on Monday.   Assessment & Plan:   Active Problems:   Infected wound   Necrotic left second toe with superimposed cellulitis Exact timeframe is unknown. Patient states ~been black for 3 to 4 days, we suspect longer. Sensation decreased,  Patient is known to podiatry as well as [facility].  No osteomyelitis noted on plain film imaging. Clinically not septic by  criteria.  Plan: Admit inpatient, MedSurg Empiric vancomycin IV, pharmacy dosing Clindamycin 600 mg IV every 8 hours. DC due to risk of C .Diff Continue  IVF  Gentle hydration. Blood cultures : No growth so far As needed pain control WOC consult : out of scope of practice. Vascular consulted ,  Dr. Lucky Cowboy recommended the left lower extremity angiogram,  possible intervention on Monday Podiatry consulted,   Dr. Vickki Muff states patient at high risk for amputation given clinical situation. Suspect patient will require surgical intervention/amputation Appreciate surgical recommendations  Chronic diastolic congestive heart failure Does not appear acutely exacerbated. Continue goal-directed medical therapy Continue Coreg 6.25 twice daily Continue Demadex 20 twice daily No lisinopril noted on home med rec.  Peripheral vascular disease History of CVA. Continue dual antiplatelet therapy with aspirin and Plavix.  Essential hypertension Continue Coreg 6.25 twice daily Continue amlodipine 10 mg daily Continue hydralazine 25 mg p.o. every 8 hours As needed hydralazine  Insulin-dependent diabetes mellitus Diabetic neuropathy Home Lantus dose 18 units daily at bedtime Plan: Start Lantus 18 units nightly Moderate sliding scale Carb modified diet Check A1c Continue gabapentin/pregabalin Consider diabetes coordinator consult if glycemic control becomes difficult  GERD PPI  COPD Appears baseline Not acutely exacerbated Continue home nebulizer/inhaler regimen   DVT prophylaxis: Lovenox Code Status: Full Family Communication: Husband at bedside Disposition Plan: Anticipate return to previous home environment Consults called: Podiatry: Vickki Muff; vascular: Dew Patient is not medically clear requiring possible amputation and further treatment.    Consultants:    Podiatry, Wound care and Vascular surgery.  Procedures:  Antimicrobials:  Anti-infectives (From admission,  onward)   Start     Dose/Rate Route Frequency Ordered  Stop   03/31/20 1400  metroNIDAZOLE (FLAGYL) IVPB 500 mg        500 mg 100 mL/hr over 60 Minutes Intravenous Every 8 hours 03/31/20 1043     03/31/20 1200  cefTRIAXone (ROCEPHIN) 1 g in sodium chloride 0.9 % 100 mL IVPB        1 g 200 mL/hr over 30 Minutes Intravenous Daily 03/31/20 1043     03/31/20 0600  vancomycin (VANCOREADY) IVPB 500 mg/100 mL        500 mg 100 mL/hr over 60 Minutes Intravenous Every 12 hours 03/30/20 1917     03/30/20 1615  cefTRIAXone (ROCEPHIN) 1 g in sodium chloride 0.9 % 100 mL IVPB  Status:  Discontinued        1 g 200 mL/hr over 30 Minutes Intravenous Every 24 hours 03/30/20 1607 03/30/20 1706   03/30/20 1615  clindamycin (CLEOCIN) IVPB 600 mg  Status:  Discontinued        600 mg 100 mL/hr over 30 Minutes Intravenous Every 8 hours 03/30/20 1608 03/30/20 1706   03/30/20 1600  vancomycin (VANCOCIN) IVPB 1000 mg/200 mL premix        1,000 mg 200 mL/hr over 60 Minutes Intravenous  Once 03/30/20 1556 03/30/20 2243   03/30/20 1600  clindamycin (CLEOCIN) IVPB 600 mg  Status:  Discontinued        600 mg 100 mL/hr over 30 Minutes Intravenous Every 8 hours 03/30/20 1556 03/31/20 1043     Subjective: Patient was seen and examined at bedside.  No overnight events.   She appears much alert, oriented tells her name, denies any pain. Patient is very concerned about losing the toe, explained that it  will be discussed in detail before procedure.  Objective: Vitals:   04/01/20 1732 04/02/20 0009 04/02/20 0523 04/02/20 0820  BP: (!) 134/53 (!) 124/48 (!) 132/47 (!) 189/77  Pulse: 75 78  81  Resp: 17 18  17   Temp: 98 F (36.7 C) 97.7 F (36.5 C)  97.9 F (36.6 C)  TempSrc: Oral Oral  Oral  SpO2: 99% 99%  98%  Weight:      Height:        Intake/Output Summary (Last 24 hours) at 04/02/2020 1235 Last data filed at 04/02/2020 1202 Gross per 24 hour  Intake --  Output 2250 ml  Net -2250 ml   Filed Weights    03/30/20 1659  Weight: 66.2 kg    Examination:  General exam: Appears calm and comfortable, lethargic arousable to sternal rub. Respiratory system: Clear to auscultation. Respiratory effort normal. Cardiovascular system: S1 & S2 heard, RRR. No JVD, murmurs, rubs, gallops or clicks. No pedal edema. Gastrointestinal system: Abdomen is nondistended, soft and nontender. No organomegaly or masses felt. Normal bowel sounds heard. Central nervous system: Alert and oriented. No focal neurological deficits. Extremities:  Left LE: cold, gangrenous toes noted. Skin: No rashes, lesions or ulcers Psychiatry: Judgement and insight appear normal. Mood & affect appropriate.     Data Reviewed: I have personally reviewed following labs and imaging studies  CBC: Recent Labs  Lab 03/30/20 1058 03/31/20 0634 04/01/20 0615 04/02/20 0605  WBC 13.9* 9.4 9.2 9.6  NEUTROABS 10.2*  --   --   --   HGB 12.0 10.8* 10.7* 10.3*  HCT 37.6 32.8* 33.3* 32.6*  MCV 83.6 83.0 85.8 84.9  PLT 305 284 253 161   Basic Metabolic Panel: Recent Labs  Lab 03/30/20 1058 03/31/20 0634 04/01/20 0615 04/02/20 0605  NA  136 141 138 138  K 4.3 4.1 4.0 3.3*  CL 99 103 104 104  CO2 30 28 26 23   GLUCOSE 265* 243* 162* 147*  BUN 13 13 13 11   CREATININE 0.54 0.69 0.51 0.53  CALCIUM 9.3 9.4 8.8* 8.6*  MG  --   --  1.9  --   PHOS  --   --  3.5  --    GFR: Estimated Creatinine Clearance: 53 mL/min (by C-G formula based on SCr of 0.53 mg/dL). Liver Function Tests: Recent Labs  Lab 03/30/20 1058 03/31/20 0634 04/01/20 0615  AST 17 13* 14*  ALT 13 10 8   ALKPHOS 69 60 54  BILITOT 0.6 0.9 0.6  PROT 6.6 5.8* 5.7*  ALBUMIN 3.2* 2.9* 2.8*   No results for input(s): LIPASE, AMYLASE in the last 168 hours. No results for input(s): AMMONIA in the last 168 hours. Coagulation Profile: Recent Labs  Lab 03/30/20 2238 03/31/20 0634  INR 1.1 1.1   Cardiac Enzymes: Recent Labs  Lab 03/30/20 1058  CKTOTAL 45   BNP  (last 3 results) No results for input(s): PROBNP in the last 8760 hours. HbA1C: Recent Labs    03/30/20 1722  HGBA1C 10.2*   CBG: Recent Labs  Lab 04/01/20 1233 04/01/20 1730 04/01/20 2203 04/02/20 0820 04/02/20 1202  GLUCAP 143* 139* 91 169* 145*   Lipid Profile: No results for input(s): CHOL, HDL, LDLCALC, TRIG, CHOLHDL, LDLDIRECT in the last 72 hours. Thyroid Function Tests: No results for input(s): TSH, T4TOTAL, FREET4, T3FREE, THYROIDAB in the last 72 hours. Anemia Panel: No results for input(s): VITAMINB12, FOLATE, FERRITIN, TIBC, IRON, RETICCTPCT in the last 72 hours. Sepsis Labs: Recent Labs  Lab 03/30/20 1058  LATICACIDVEN 1.4    Recent Results (from the past 240 hour(s))  Culture, blood (Routine x 2)     Status: None (Preliminary result)   Collection Time: 03/30/20 10:58 AM   Specimen: BLOOD  Result Value Ref Range Status   Specimen Description BLOOD RIGHT HAND  Final   Special Requests   Final    BOTTLES DRAWN AEROBIC AND ANAEROBIC Blood Culture adequate volume   Culture   Final    NO GROWTH 3 DAYS Performed at [facility], [address], [place], [place] [address]    Report Status PENDING  Incomplete  SARS Coronavirus 2 by RT PCR (hospital order, performed in [facility] hospital lab) Nasopharyngeal Nasopharyngeal Swab     Status: None   Collection Time: 03/30/20  6:07 PM   Specimen: Nasopharyngeal Swab  Result Value Ref Range Status   SARS Coronavirus 2 NEGATIVE NEGATIVE Final    Comment: (NOTE) SARS-CoV-2 target nucleic acids are NOT DETECTED.  The SARS-CoV-2 RNA is generally detectable in upper and lower respiratory specimens during the acute phase of infection. The lowest concentration of SARS-CoV-2 viral copies this assay can detect is 250 copies / mL. A negative result does not preclude SARS-CoV-2 infection and should not be used as the sole basis for treatment or other patient management decisions.  A negative result may occur  with improper specimen collection / handling, submission of specimen other than nasopharyngeal swab, presence of viral mutation(s) within the areas targeted by this assay, and inadequate number of viral copies (<250 copies / mL). A negative result must be combined with clinical observations, patient history, and epidemiological information.  Fact Sheet for Patients:   StrictlyIdeas.no  Fact Sheet for Healthcare Providers: BankingDealers.co.za  This test is not yet approved or  cleared  by the [place] and has been authorized for detection and/or diagnosis of SARS-CoV-2 by FDA under an Emergency Use Authorization (EUA).  This EUA will remain in effect (meaning this test can be used) for the duration of the COVID-19 declaration under Section 564(b)(1) of the Act, 21 U.S.C. section 360bbb-3(b)(1), unless the authorization is terminated or revoked sooner.  Performed at [facility], [address] [place], [place] [address]   MRSA PCR Screening     Status: None   Collection Time: 03/31/20  4:03 AM   Specimen: Nasopharyngeal  Result Value Ref Range Status   MRSA by PCR NEGATIVE NEGATIVE Final    Comment:        The GeneXpert MRSA Assay (FDA approved for NASAL specimens only), is one component of a comprehensive MRSA colonization surveillance program. It is not intended to diagnose MRSA infection nor to guide or monitor treatment for MRSA infections. Performed at [facility], [address] [place], [place] [address]     Radiology Studies: No results found.  Scheduled Meds: . amLODipine  10 mg Oral Daily  . aspirin EC  81 mg Oral Daily  . atorvastatin  20 mg Oral Daily  . budesonide  0.25 mg Nebulization BID  . carvedilol  6.25 mg Oral BID WC  . clopidogrel  75 mg Oral Q breakfast  . docusate sodium  100 mg Oral BID  . enoxaparin (LOVENOX) injection  40 mg Subcutaneous Q24H  . folic acid  1 mg  Oral Daily  . gabapentin  300 mg Oral QPM  . hydrALAZINE  25 mg Oral Q8H  . insulin aspart  0-15 Units Subcutaneous TID WC  . insulin aspart  0-5 Units Subcutaneous QHS  . insulin detemir  10 Units Subcutaneous QHS  . isosorbide dinitrate  30 mg Oral Daily  . loratadine  10 mg Oral Daily  . magnesium oxide  400 mg Oral BID  . modafinil  200 mg Oral Daily  . pantoprazole  40 mg Oral Daily  . torsemide  20 mg Oral BID   Continuous Infusions: . sodium chloride 75 mL/hr at 04/01/20 2212  . cefTRIAXone (ROCEPHIN)  IV 1 g (04/02/20 1051)  . metronidazole 500 mg (04/02/20 0528)  . vancomycin 500 mg (04/02/20 0701)     LOS: 3 days    Time spent: 25 mins    Leam Madero, MD [facility]   If 7PM-7AM, please contact night-coverage

## 2020-04-03 ENCOUNTER — Encounter: Payer: Self-pay | Admitting: Internal Medicine

## 2020-04-03 ENCOUNTER — Encounter
Admission: EM | Disposition: A | Discharge status: Home Health | Payer: Self-pay | Source: Home / Self Care | Attending: Family Medicine

## 2020-04-03 DIAGNOSIS — I70262 Atherosclerosis of native arteries of extremities with gangrene, left leg: Secondary | ICD-10-CM

## 2020-04-03 HISTORY — PX: LOWER EXTREMITY ANGIOGRAPHY: CATH118251

## 2020-04-03 LAB — BASIC METABOLIC PANEL
Anion gap: 5 (ref 5–15)
BUN: 10 mg/dL (ref 8–23)
CO2: 27 mmol/L (ref 22–32)
Calcium: 8.8 mg/dL — ABNORMAL LOW (ref 8.9–10.3)
Chloride: 108 mmol/L (ref 98–111)
Creatinine, Ser: 0.44 mg/dL (ref 0.44–1.00)
GFR calc Af Amer: >60 mL/min (ref >60)
GFR calc non Af Amer: >60 mL/min (ref >60)
Glucose, Bld: 94 mg/dL (ref 70–99)
Potassium: 3.5 mmol/L (ref 3.5–5.1)
Sodium: 140 mmol/L (ref 135–145)

## 2020-04-03 LAB — GLUCOSE, CAPILLARY
Glucose-Capillary: 104 mg/dL — ABNORMAL HIGH (ref 70–99)
Glucose-Capillary: 185 mg/dL — ABNORMAL HIGH (ref 70–99)
Glucose-Capillary: 232 mg/dL — ABNORMAL HIGH (ref 70–99)
Glucose-Capillary: 256 mg/dL — ABNORMAL HIGH (ref 70–99)
Glucose-Capillary: 79 mg/dL (ref 70–99)
Glucose-Capillary: 87 mg/dL (ref 70–99)
Glucose-Capillary: 92 mg/dL (ref 70–99)

## 2020-04-03 LAB — CBC
HCT: 33.8 % — ABNORMAL LOW (ref 36.0–46.0)
Hemoglobin: 10.9 g/dL — ABNORMAL LOW (ref 12.0–15.0)
MCH: 27.3 pg (ref 26.0–34.0)
MCHC: 32.2 g/dL (ref 30.0–36.0)
MCV: 84.7 fL (ref 80.0–100.0)
Platelets: 288 10*3/uL (ref 150–400)
RBC: 3.99 MIL/uL (ref 3.87–5.11)
RDW: 14.7 % (ref 11.5–15.5)
WBC: 9.2 10*3/uL (ref 4.0–10.5)
nRBC: 0 % (ref 0.0–0.2)

## 2020-04-03 SURGERY — LOWER EXTREMITY ANGIOGRAPHY
Anesthesia: Moderate Sedation | Laterality: Left

## 2020-04-03 MED ORDER — SODIUM CHLORIDE 0.9 % IV SOLN: INTRAVENOUS | Status: DC

## 2020-04-03 MED ORDER — DIPHENHYDRAMINE HCL 50 MG/ML IJ SOLN
INTRAMUSCULAR | Status: AC
  Administered 2020-04-03: 50 mg via INTRAVENOUS
  Filled 2020-04-03: qty 1

## 2020-04-03 MED ORDER — MIDAZOLAM HCL 2 MG/2ML IJ SOLN
INTRAMUSCULAR | Status: DC | PRN
  Administered 2020-04-03: 2 mg via INTRAVENOUS

## 2020-04-03 MED ORDER — IODIXANOL 320 MG/ML IV SOLN
INTRAVENOUS | Status: DC | PRN
  Administered 2020-04-03: 65 mL

## 2020-04-03 MED ORDER — METHYLPREDNISOLONE SODIUM SUCC 125 MG IJ SOLR: 125.0000 mg | Freq: Once | INTRAMUSCULAR | Status: AC

## 2020-04-03 MED ORDER — METHYLPREDNISOLONE SODIUM SUCC 125 MG IJ SOLR
INTRAMUSCULAR | Status: AC
  Administered 2020-04-03: 125 mg via INTRAVENOUS
  Filled 2020-04-03: qty 2

## 2020-04-03 MED ORDER — OXYCODONE-ACETAMINOPHEN 5-325 MG PO TABS
1.0000 | ORAL_TABLET | Freq: Four times a day (QID) | ORAL | Status: DC | PRN
  Administered 2020-04-03 – 2020-04-08 (×7): 1 via ORAL
  Filled 2020-04-03 (×8): qty 1

## 2020-04-03 MED ORDER — HEPARIN SODIUM (PORCINE) 1000 UNIT/ML IJ SOLN
INTRAMUSCULAR | Status: DC | PRN
  Administered 2020-04-03: 5000 [IU] via INTRAVENOUS

## 2020-04-03 MED ORDER — HEPARIN SODIUM (PORCINE) 1000 UNIT/ML IJ SOLN
INTRAMUSCULAR | Status: AC
  Filled 2020-04-03: qty 1

## 2020-04-03 MED ORDER — DIPHENHYDRAMINE HCL 50 MG/ML IJ SOLN: 50.0000 mg | Freq: Once | INTRAMUSCULAR | Status: AC

## 2020-04-03 MED ORDER — FENTANYL CITRATE (PF) 100 MCG/2ML IJ SOLN
INTRAMUSCULAR | Status: AC
  Filled 2020-04-03: qty 2

## 2020-04-03 MED ORDER — MIDAZOLAM HCL 5 MG/5ML IJ SOLN
INTRAMUSCULAR | Status: AC
  Filled 2020-04-03: qty 5

## 2020-04-03 MED ORDER — FENTANYL CITRATE (PF) 100 MCG/2ML IJ SOLN
INTRAMUSCULAR | Status: DC | PRN
  Administered 2020-04-03: 50 ug via INTRAVENOUS

## 2020-04-03 SURGICAL SUPPLY — 17 items
BALLN LUTONIX 018 5X300X130 (BALLOONS) ×3
BALLN ULTRVRSE 3X150X150 (BALLOONS) ×3
BALLOON LUTONIX 018 5X300X130 (BALLOONS) ×1 IMPLANT
BALLOON ULTRVRSE 3X150X150 (BALLOONS) ×1 IMPLANT
CATH ANGIO 5F PIGTAIL 65CM (CATHETERS) ×3 IMPLANT
CATH BEACON 5 .038 100 VERT TP (CATHETERS) ×3 IMPLANT
CATH ROTAREX 135 6FR (CATHETERS) ×3 IMPLANT
DEVICE PRESTO INFLATION (MISCELLANEOUS) ×3 IMPLANT
DEVICE STARCLOSE SE CLOSURE (Vascular Products) ×3 IMPLANT
GLIDEWIRE ADV .035X260CM (WIRE) ×3 IMPLANT
PACK ANGIOGRAPHY (CUSTOM PROCEDURE TRAY) ×3 IMPLANT
SHEATH ANL2 6FRX45 HC (SHEATH) ×3 IMPLANT
SHEATH BRITE TIP 5FRX11 (SHEATH) ×3 IMPLANT
SYR MEDRAD MARK 7 150ML (SYRINGE) ×3 IMPLANT
TUBING CONTRAST HIGH PRESS 72 (TUBING) ×3 IMPLANT
WIRE G V18X300CM (WIRE) ×3 IMPLANT
WIRE J 3MM .035X145CM (WIRE) ×3 IMPLANT

## 2020-04-03 NOTE — Progress Notes (Signed)
Patient off floor today for CTA procedure with vascular surgery.  We will await results prior to planning any further surgical intervention.  Patient likely will need potentially transmetatarsal amputation versus more proximal amputation based on vascular findings.  Appreciate recommendations.  Will reassess again tomorrow.

## 2020-04-03 NOTE — Progress Notes (Signed)
PROGRESS NOTE    [name]  KDX:[id number] DOB: April 29, 1941 DOA: 03/30/2020 PCP: [name], MD   Brief Narrative:  [name] is a 79 y.o. female with medical history significant of diastolic congestive heart failure, diabetes mellitus, diabetic neuropathy, hypertension, hyperlipidemia, GERD, peripheral vascular disease, CVA presents to the ED at the request of her primary care physician for evaluation of a black necrotic left second toe with presumed cellulitis.  The majority the history was gained by reading the medical record and speaking with the ED physician  Last documentation in care everywhere was from 2019.  No updated documentation but apparently she is known to [facility].  It is unclear exactly how long her symptoms been going on or how long her toes look black. Her story is rather inconsistent.  In the emergency department she was given 1 dose of vancomycin and clindamycin because of numerous antibiotic allergies/intolerances.  Hospitalist contacted for admission. Apparently the toes been black for the last 3 to 4 days.  No osteomyelitis was noted on imaging.  Patient is admitted for necrotic left foot toes with superimposed cellulitis,  Patient is started on IV antibiotics,  Patient was seen by podiatry,  recommended patient needs vascular evaluation. Patient high risk for amputation based on clinical appearance.  Patient eventually need amputation of those necrotic toes.  Vascular surgery recommended left lower extremity angiogram,  possible intervention today.   Assessment & Plan:   Active Problems:   Infected wound   Necrotic left second toe with superimposed cellulitis Exact timeframe is unknown. Patient states ~been black for 3 to 4 days, we suspect longer. Sensation decreased,  Patient is known to podiatry as well as [facility].  No osteomyelitis noted on plain film imaging. Clinically not septic by criteria.  Plan: Admit  inpatient, MedSurg Empiric vancomycin IV, pharmacy dosing Clindamycin 600 mg IV every 8 hours. DC due to risk of C .Diff Continue  IVF  Gentle hydration. Blood cultures : No growth so far As needed pain control WOC consult : out of scope of practice. Vascular consulted ,  Dr. Lucky Cowboy recommended the left lower extremity angiogram,  possible intervention 8/30. Podiatry consulted,   Dr. Vickki Muff states patient at high risk for amputation given clinical situation. Suspect patient will require surgical intervention/amputation Appreciate surgical recommendations  Chronic diastolic congestive heart failure Does not appear acutely exacerbated. Continue goal-directed medical therapy Continue Coreg 6.25 twice daily Continue Demadex 20 twice daily No lisinopril noted on home med rec.  Peripheral vascular disease History of CVA. Continue dual antiplatelet therapy with aspirin and Plavix.  Essential hypertension Continue Coreg 6.25 twice daily Continue amlodipine 10 mg daily Continue hydralazine 25 mg p.o. every 8 hours As needed hydralazine  Insulin-dependent diabetes mellitus Diabetic neuropathy Home Lantus dose 18 units daily at bedtime Plan: Start Lantus 18 units nightly Moderate sliding scale Carb modified diet Check A1c Continue gabapentin/pregabalin Consider diabetes coordinator consult if glycemic control becomes difficult  GERD PPI  COPD Appears baseline Not acutely exacerbated Continue home nebulizer/inhaler regimen   DVT prophylaxis: Lovenox Code Status: Full Family Communication: Husband at bedside Disposition Plan: Anticipate return to previous home environment Consults called: Podiatry: Vickki Muff; vascular: Dew Patient is not medically clear requiring possible amputation and further treatment.    Consultants:    Podiatry, Wound care and Vascular surgery.  Procedures:  Antimicrobials:  Anti-infectives (From admission, onward)   Start     Dose/Rate  Route Frequency Ordered Stop  04/02/20 1800  [MAR Hold]  vancomycin (VANCOREADY) IVPB 750 mg/150 mL        (MAR Hold since Mon 04/03/2020 at 0939.Hold Reason: Transfer to a Procedural area.)   750 mg 150 mL/hr over 60 Minutes Intravenous Every 12 hours 04/02/20 1332     03/31/20 1400  [MAR Hold]  metroNIDAZOLE (FLAGYL) IVPB 500 mg        (MAR Hold since Mon 04/03/2020 at 0939.Hold Reason: Transfer to a Procedural area.)   500 mg 100 mL/hr over 60 Minutes Intravenous Every 8 hours 03/31/20 1043     03/31/20 1200  [MAR Hold]  cefTRIAXone (ROCEPHIN) 1 g in sodium chloride 0.9 % 100 mL IVPB        (MAR Hold since Mon 04/03/2020 at 0939.Hold Reason: Transfer to a Procedural area.)   1 g 200 mL/hr over 30 Minutes Intravenous Daily 03/31/20 1043     03/31/20 0600  vancomycin (VANCOREADY) IVPB 500 mg/100 mL  Status:  Discontinued        500 mg 100 mL/hr over 60 Minutes Intravenous Every 12 hours 03/30/20 1917 04/02/20 1332   03/30/20 1615  cefTRIAXone (ROCEPHIN) 1 g in sodium chloride 0.9 % 100 mL IVPB  Status:  Discontinued        1 g 200 mL/hr over 30 Minutes Intravenous Every 24 hours 03/30/20 1607 03/30/20 1706   03/30/20 1615  clindamycin (CLEOCIN) IVPB 600 mg  Status:  Discontinued        600 mg 100 mL/hr over 30 Minutes Intravenous Every 8 hours 03/30/20 1608 03/30/20 1706   03/30/20 1600  vancomycin (VANCOCIN) IVPB 1000 mg/200 mL premix        1,000 mg 200 mL/hr over 60 Minutes Intravenous  Once 03/30/20 1556 03/30/20 2243   03/30/20 1600  clindamycin (CLEOCIN) IVPB 600 mg  Status:  Discontinued        600 mg 100 mL/hr over 30 Minutes Intravenous Every 8 hours 03/30/20 1556 03/31/20 1043     Subjective: Patient was seen and examined at bedside.  No overnight events.   She appears more sleepy but arousable , scheduled to have angiogram today, possible amputation.  Objective: Vitals:   04/03/20 0050 04/03/20 0522 04/03/20 0731 04/03/20 0941  BP: (!) 152/61 (!) 162/67 (!) 99/50 (!)  183/65  Pulse: 82 75 72 84  Resp: 20 18 17  (!) 8  Temp: 98.1 F (36.7 C) 97.7 F (36.5 C) 98.6 F (37 C) (!) 97.5 F (36.4 C)  TempSrc:  Oral  Oral  SpO2: 98% 99% 99% 97%  Weight:      Height:        Intake/Output Summary (Last 24 hours) at 04/03/2020 1116 Last data filed at 04/03/2020 0626 Gross per 24 hour  Intake 4565.42 ml  Output 4050 ml  Net 515.42 ml   Filed Weights   03/30/20 1659  Weight: 66.2 kg    Examination:  General exam: Appears calm and comfortable, lethargic arousable to sternal rub. Respiratory system: Clear to auscultation. Respiratory effort normal. Cardiovascular system: S1 & S2 heard, RRR. No JVD, murmurs, rubs, gallops or clicks. No pedal edema. Gastrointestinal system: Abdomen is nondistended, soft and nontender. No organomegaly or masses felt. Normal bowel sounds heard. Central nervous system: Alert and oriented. No focal neurological deficits. Extremities:  Left LE: cold, gangrenous toes noted. Skin: No rashes, lesions or ulcers Psychiatry: Judgement and insight appear normal. Mood & affect appropriate.     Data Reviewed: I have personally reviewed following labs  and imaging studies  CBC: Recent Labs  Lab 03/30/20 1058 03/31/20 0634 04/01/20 0615 04/02/20 0605 04/03/20 0425  WBC 13.9* 9.4 9.2 9.6 9.2  NEUTROABS 10.2*  --   --   --   --   HGB 12.0 10.8* 10.7* 10.3* 10.9*  HCT 37.6 32.8* 33.3* 32.6* 33.8*  MCV 83.6 83.0 85.8 84.9 84.7  PLT 305 284 253 279 127   Basic Metabolic Panel: Recent Labs  Lab 03/30/20 1058 03/31/20 0634 04/01/20 0615 04/02/20 0605 04/03/20 0425  NA 136 141 138 138 140  K 4.3 4.1 4.0 3.3* 3.5  CL 99 103 104 104 108  CO2 30 28 26 23 27   GLUCOSE 265* 243* 162* 147* 94  BUN 13 13 13 11 10   CREATININE 0.54 0.69 0.51 0.53 0.44  CALCIUM 9.3 9.4 8.8* 8.6* 8.8*  MG  --   --  1.9  --   --   PHOS  --   --  3.5  --   --    GFR: Estimated Creatinine Clearance: 53 mL/min (by C-G formula based on SCr of 0.44  mg/dL). Liver Function Tests: Recent Labs  Lab 03/30/20 1058 03/31/20 0634 04/01/20 0615  AST 17 13* 14*  ALT 13 10 8   ALKPHOS 69 60 54  BILITOT 0.6 0.9 0.6  PROT 6.6 5.8* 5.7*  ALBUMIN 3.2* 2.9* 2.8*   No results for input(s): LIPASE, AMYLASE in the last 168 hours. No results for input(s): AMMONIA in the last 168 hours. Coagulation Profile: Recent Labs  Lab 03/30/20 2238 03/31/20 0634  INR 1.1 1.1   Cardiac Enzymes: Recent Labs  Lab 03/30/20 1058  CKTOTAL 45   BNP (last 3 results) No results for input(s): PROBNP in the last 8760 hours. HbA1C: No results for input(s): HGBA1C in the last 72 hours. CBG: Recent Labs  Lab 04/02/20 1644 04/02/20 2107 04/03/20 0732 04/03/20 0859 04/03/20 0947  GLUCAP 146* 190* 79 87 92   Lipid Profile: No results for input(s): CHOL, HDL, LDLCALC, TRIG, CHOLHDL, LDLDIRECT in the last 72 hours. Thyroid Function Tests: No results for input(s): TSH, T4TOTAL, FREET4, T3FREE, THYROIDAB in the last 72 hours. Anemia Panel: No results for input(s): VITAMINB12, FOLATE, FERRITIN, TIBC, IRON, RETICCTPCT in the last 72 hours. Sepsis Labs: Recent Labs  Lab 03/30/20 1058  LATICACIDVEN 1.4    Recent Results (from the past 240 hour(s))  Culture, blood (Routine x 2)     Status: None (Preliminary result)   Collection Time: 03/30/20 10:58 AM   Specimen: BLOOD  Result Value Ref Range Status   Specimen Description BLOOD RIGHT HAND  Final   Special Requests   Final    BOTTLES DRAWN AEROBIC AND ANAEROBIC Blood Culture adequate volume   Culture   Final    NO GROWTH 4 DAYS Performed at [facility], [address], [place], [place] [address]    Report Status PENDING  Incomplete  SARS Coronavirus 2 by RT PCR (hospital order, performed in [facility] hospital lab) Nasopharyngeal Nasopharyngeal Swab     Status: None   Collection Time: 03/30/20  6:07 PM   Specimen: Nasopharyngeal Swab  Result Value Ref Range Status   SARS Coronavirus 2  NEGATIVE NEGATIVE Final    Comment: (NOTE) SARS-CoV-2 target nucleic acids are NOT DETECTED.  The SARS-CoV-2 RNA is generally detectable in upper and lower respiratory specimens during the acute phase of infection. The lowest concentration of SARS-CoV-2 viral copies this assay can detect is 250 copies / mL. A  negative result does not preclude SARS-CoV-2 infection and should not be used as the sole basis for treatment or other patient management decisions.  A negative result may occur with improper specimen collection / handling, submission of specimen other than nasopharyngeal swab, presence of viral mutation(s) within the areas targeted by this assay, and inadequate number of viral copies (<250 copies / mL). A negative result must be combined with clinical observations, patient history, and epidemiological information.  Fact Sheet for Patients:   StrictlyIdeas.no  Fact Sheet for Healthcare Providers: BankingDealers.co.za  This test is not yet approved or  cleared by the [place] FDA and has been authorized for detection and/or diagnosis of SARS-CoV-2 by FDA under an Emergency Use Authorization (EUA).  This EUA will remain in effect (meaning this test can be used) for the duration of the COVID-19 declaration under Section 564(b)(1) of the Act, 21 U.S.C. section 360bbb-3(b)(1), unless the authorization is terminated or revoked sooner.  Performed at [facility], [address] [place], [place] [address]   MRSA PCR Screening     Status: None   Collection Time: 03/31/20  4:03 AM   Specimen: Nasopharyngeal  Result Value Ref Range Status   MRSA by PCR NEGATIVE NEGATIVE Final    Comment:        The GeneXpert MRSA Assay (FDA approved for NASAL specimens only), is one component of a comprehensive MRSA colonization surveillance program. It is not intended to diagnose MRSA infection nor to guide or monitor treatment  for MRSA infections. Performed at [facility], [address] [place], [place] [address]     Radiology Studies: No results found.  Scheduled Meds: . [MAR Hold] amLODipine  10 mg Oral Daily  . [MAR Hold] aspirin EC  81 mg Oral Daily  . [MAR Hold] atorvastatin  20 mg Oral Daily  . [MAR Hold] budesonide  0.25 mg Nebulization BID  . [MAR Hold] carvedilol  6.25 mg Oral BID WC  . [MAR Hold] clopidogrel  75 mg Oral Q breakfast  . [MAR Hold] docusate sodium  100 mg Oral BID  . [MAR Hold] enoxaparin (LOVENOX) injection  40 mg Subcutaneous Q24H  . fentaNYL      . [MAR Hold] folic acid  1 mg Oral Daily  . [MAR Hold] gabapentin  300 mg Oral QPM  . heparin sodium (porcine)      . [MAR Hold] hydrALAZINE  25 mg Oral Q8H  . [MAR Hold] insulin aspart  0-15 Units Subcutaneous TID WC  . [MAR Hold] insulin aspart  0-5 Units Subcutaneous QHS  . [MAR Hold] insulin detemir  10 Units Subcutaneous QHS  . [MAR Hold] isosorbide dinitrate  30 mg Oral Daily  . [MAR Hold] loratadine  10 mg Oral Daily  . [MAR Hold] magnesium oxide  400 mg Oral BID  . midazolam      . [MAR Hold] modafinil  200 mg Oral Daily  . [MAR Hold] pantoprazole  40 mg Oral Daily  . [MAR Hold] torsemide  20 mg Oral BID   Continuous Infusions: . sodium chloride 75 mL/hr at 04/03/20 0811  . sodium chloride    . [MAR Hold] cefTRIAXone (ROCEPHIN)  IV Stopped (04/02/20 1121)  . [MAR Hold] metronidazole 500 mg (04/03/20 0516)  . [MAR Hold] vancomycin 750 mg (04/03/20 0618)     LOS: 4 days    Time spent: 25 mins    Avanti Jetter, MD [facility]   If 7PM-7AM, please contact night-coverage

## 2020-04-03 NOTE — Op Note (Signed)
[facility]  Percutaneous Study/Intervention Procedural Note   Date of Surgery: 04/03/2020  Surgeon(s):Marlina Cataldi    Assistants:none  Pre-operative Diagnosis: PAD with gangrene left foot  Post-operative diagnosis:  Same  Procedure(s) Performed:             1.  Ultrasound guidance for vascular access right femoral artery             2.  Catheter placement into left common femoral artery from right femoral approach             3.  Aortogram and selective left lower extremity angiogram             4.   Mechanical thrombectomy of the left SFA and popliteal arteries with the roto-Rex device for chronic thrombus             5.   Percutaneous transluminal angioplasty of left anterior tibial artery with 3 mm diameter angioplasty balloon  6.  Percutaneous transluminal angioplasty of the left SFA and popliteal arteries with 5 mm diameter Lutonix drug-coated angioplasty balloon             7.  StarClose closure device right femoral artery  EBL: 100 cc  Contrast: 65 cc  Fluoro Time: 5.4 minutes  Moderate Conscious Sedation Time: approximately 40 minutes using 2 mg of Versed and 50 mcg of Fentanyl              Indications:  Patient is a 79 y.o.female with a long history of peripheral arterial disease and a gangrenous left second toe. The patient is brought in for angiography for further evaluation and potential treatment.  Due to the limb threatening nature of the situation, angiogram was performed for attempted limb salvage. The patient is aware that if the procedure fails, amputation would be expected.  The patient also understands that even with successful revascularization, amputation may still be required due to the severity of the situation.  Risks and benefits are discussed and informed consent is obtained.   Procedure:  The patient was identified and appropriate procedural time out was performed.  The patient was then placed supine on the table and prepped and  draped in the usual sterile fashion. Moderate conscious sedation was administered during a face to face encounter with the patient throughout the procedure with my supervision of the RN administering medicines and monitoring the patient's vital signs, pulse oximetry, telemetry and mental status throughout from the start of the procedure until the patient was taken to the recovery room. Ultrasound was used to evaluate the right common femoral artery.  It was patent .  A digital ultrasound image was acquired.  A Seldinger needle was used to access the right common femoral artery under direct ultrasound guidance and a permanent image was performed.  A 0.035 J wire was advanced without resistance and a 5Fr sheath was placed.  Pigtail catheter was placed into the aorta and an AP aortogram was performed. This demonstrated normal renal artery on the right and mild renal artery stenosis on the left and normal aorta and iliac segments without significant stenosis. I then crossed the aortic bifurcation and advanced to the left femoral head. Selective left lower extremity angiogram was then performed. This demonstrated reasonably normal common femoral artery and proximal superficial femoral artery.  The profunda femoris artery was patent proximally but had poor branching not allowing good collateral blood flow.  The mid to distal SFA had a focal area of about 75 to  80% stenosis.  The vessel then normalized and at Hunter's canal became heavily diseased and occluded throughout the popliteal artery.  The anterior tibial artery reconstituted after a proximal occlusion and moderate to severe disease in the 70 to 80% range in the proximal segment and was then continuous distally although blood flow in the foot was still fairly poor.  This also provided collateral blood flow into the other tibial vessels helping to feed the foot.  The tibioperoneal trunk, peroneal artery, and posterior tibial artery were occluded. It was felt that it  was in the patient's best interest to proceed with intervention after these images to avoid a second procedure and a larger amount of contrast and fluoroscopy based off of the findings from the initial angiogram. The patient was systemically heparinized and a 6 [place] Ansell sheath was then placed over the [employer] wire. I then used a Kumpe catheter and the advantage wire to easily cross the SFA and popliteal disease and get down into the anterior tibial artery.  Continue to cross the anterior tibial artery occlusion and stenosis using the wire as a guide and then got the Kumpe catheter down to the mid anterior tibial artery which imaging showed this to then be continuous in the foot with some small vessel disease in the foot but this being the only good runoff distally.  I then placed a V 18 wire and remove the diagnostic catheter.  Mechanical thrombectomy was performed to remove the chronic thrombus within the left SFA and popliteal arteries.  Several passes were made with the roto-Rex device in the left SFA and popliteal arteries.  Following this, there still remained residual stenosis in the SFA and popliteal arteries and the residual anterior tibial disease.  I elected to treat these areas with angioplasty.  With 3 mm diameter by 15 cm length angioplasty balloon was inflated from the origin of the anterior tibial artery down to the proximal to mid segment.  This was taken up to 8 atm for 1 minute.  A 5 mm diameter by 30 cm length Lutonix drug-coated angioplasty balloon was then inflated to 10 atm from the distal popliteal artery up through the entirety of the popliteal artery and up into the distal SFA to encompass the separate stenotic lesion above the occlusion as well.  This was held for a minute.  Completion imaging showed brisk flow with less than 15% residual stenosis in the anterior tibial artery and less than 10% residual stenosis in the SFA and popliteal arteries. I elected to terminate the  procedure. The sheath was removed and StarClose closure device was deployed in the right femoral artery with excellent hemostatic result. The patient was taken to the recovery room in stable condition having tolerated the procedure well.  Findings:               Aortogram:  There appeared to be mild left renal artery stenosis that was not hemodynamically significant and no right renal artery stenosis.  The aorta and iliac arteries were widely patent without significant stenosis.             Left lower Extremity:  This demonstrated reasonably normal common femoral artery and proximal superficial femoral artery.  The profunda femoris artery was patent proximally but had poor branching not allowing good collateral blood flow.  The mid to distal SFA had a focal area of about 75 to 80% stenosis.  The vessel then normalized and at Hunter's canal became heavily diseased and occluded throughout  the popliteal artery.  The anterior tibial artery reconstituted after a proximal occlusion and moderate to severe disease in the 70 to 80% range in the proximal segment and was then continuous distally although blood flow in the foot was still fairly poor.  This also provided collateral blood flow into the other tibial vessels helping to feed the foot.  The tibioperoneal trunk, peroneal artery, and posterior tibial artery were occluded.   Disposition: Patient was taken to the recovery room in stable condition having tolerated the procedure well.  Complications: None  Leotis Pain 04/03/2020 12:20 PM   This note was created with Dragon Medical transcription system. Any errors in dictation are purely unintentional.

## 2020-04-04 LAB — GLUCOSE, CAPILLARY
Glucose-Capillary: 152 mg/dL — ABNORMAL HIGH (ref 70–99)
Glucose-Capillary: 140 mg/dL — ABNORMAL HIGH (ref 70–99)
Glucose-Capillary: 250 mg/dL — ABNORMAL HIGH (ref 70–99)
Glucose-Capillary: 222 mg/dL — ABNORMAL HIGH (ref 70–99)
Glucose-Capillary: 224 mg/dL — ABNORMAL HIGH (ref 70–99)

## 2020-04-04 LAB — BASIC METABOLIC PANEL
Anion gap: 10 (ref 5–15)
BUN: 13 mg/dL (ref 8–23)
CO2: 22 mmol/L (ref 22–32)
Calcium: 8.6 mg/dL — ABNORMAL LOW (ref 8.9–10.3)
Chloride: 108 mmol/L (ref 98–111)
Creatinine, Ser: 0.61 mg/dL (ref 0.44–1.00)
GFR calc Af Amer: >60 mL/min (ref >60)
GFR calc non Af Amer: >60 mL/min (ref >60)
Glucose, Bld: 206 mg/dL — ABNORMAL HIGH (ref 70–99)
Potassium: 3.7 mmol/L (ref 3.5–5.1)
Sodium: 140 mmol/L (ref 135–145)

## 2020-04-04 LAB — CBC
HCT: 32.4 % — ABNORMAL LOW (ref 36.0–46.0)
Hemoglobin: 10.3 g/dL — ABNORMAL LOW (ref 12.0–15.0)
MCH: 27 pg (ref 26.0–34.0)
MCHC: 31.8 g/dL (ref 30.0–36.0)
MCV: 84.8 fL (ref 80.0–100.0)
Platelets: 289 10*3/uL (ref 150–400)
RBC: 3.82 MIL/uL — ABNORMAL LOW (ref 3.87–5.11)
RDW: 15.2 % (ref 11.5–15.5)
WBC: 11.8 10*3/uL — ABNORMAL HIGH (ref 4.0–10.5)
nRBC: 0 % (ref 0.0–0.2)

## 2020-04-04 LAB — CULTURE, BLOOD (ROUTINE X 2)
Culture: NO GROWTH
Special Requests: ADEQUATE

## 2020-04-04 NOTE — Progress Notes (Signed)
[facility] Daily Progress Note   Subjective: 04/03/20: 1. Ultrasound guidance for vascular access right femoral artery 2. Catheter placement into left common femoral artery from right femoral approach 3. Aortogram and selective left lower extremity angiogram 4.  Mechanical thrombectomy of the left SFA and popliteal arteries with the roto-Rex device for chronic thrombus 5.  Percutaneous transluminal angioplasty of left anterior tibial artery with 3 mm diameter angioplasty balloon             6.  Percutaneous transluminal angioplasty of the left SFA and popliteal arteries with 5 mm diameter Lutonix drug-coated angioplasty balloon 7. StarClose closure device right femoral artery  Sleeping comfortably this AM.  No issues overnight.  Objective: Vitals:   04/03/20 2358 04/04/20 0522 04/04/20 0744 04/04/20 0828  BP: (!) 122/50 (!) 122/47 (!) 151/58   Pulse: 88 77 88 85  Resp: 18 16 18 16   Temp: 98.2 F (36.8 C) 97.7 F (36.5 C) 97.8 F (36.6 C)   TempSrc: Oral Oral Oral   SpO2: 98% 99% 98% 97%  Weight:      Height:        Intake/Output Summary (Last 24 hours) at 04/04/2020 1014 Last data filed at 04/04/2020 0543 Gross per 24 hour  Intake 708.6 ml  Output 400 ml  Net 308.6 ml   Physical Exam: A&Ox1, NAD CV: RRR Pulmonary: CTA Bilaterally Abdomen: Soft, Nontender, Nondistended Right Groin:  Access site: Procedure dressing clean dry and intact.  No swelling or drainage. Vascular:  Left lower extremity: Thigh soft.  Calf soft.  No compartment syndrome.  Mild to moderate edema.  Extremities warm distally to toes.  Stable gangrene to the second toe.   Laboratory: CBC    Component Value Date/Time   WBC 11.8 (H) 04/04/2020 0150   HGB 10.3 (L) 04/04/2020 0150   HCT 32.4 (L) 04/04/2020 0150   PLT 289 04/04/2020 0150   BMET    Component Value Date/Time   NA 140 04/04/2020 0150    K 3.7 04/04/2020 0150   CL 108 04/04/2020 0150   CO2 22 04/04/2020 0150   GLUCOSE 206 (H) 04/04/2020 0150   BUN 13 04/04/2020 0150   CREATININE 0.61 04/04/2020 0150   CALCIUM 8.6 (L) 04/04/2020 0150   GFRNONAA >60 04/04/2020 0150   GFRAA >60 04/04/2020 0150   Assessment/Planning: The patient is a 79 year old female with multiple medical issues including known peripheral artery disease status post endovascular intervention/amputation in the past now presents with gangrene to the left second toe status post endovascular mention to the left lower extremity - POD#1  1) successful revascularization of the left lower extremity.  Patient with one vessel runoff to the foot. 2) complete continue with aspirin, Plavix and statin for medical management 3) we will see the patient in our clinic in approximately 1 month to surveilled her disease  Discussed with Dr. Ellis Parents Davi Kroon PA-C 04/04/2020 10:14 AM

## 2020-04-04 NOTE — Progress Notes (Signed)
Daily Progress Note   Subjective  - 1 Day Post-Op  Follow-up left second toe gangrene.  Patient status post successful revascularization with one-vessel runoff to the foot  Objective Vitals:   04/03/20 2358 04/04/20 0522 04/04/20 0744 04/04/20 0828  BP: (!) 122/50 (!) 122/47 (!) 151/58   Pulse: 88 77 88 85  Resp: 18 16 18 16   Temp: 98.2 F (36.8 C) 97.7 F (36.5 C) 97.8 F (36.6 C)   TempSrc: Oral Oral Oral   SpO2: 98% 99% 98% 97%  Weight:      Height:        Physical Exam: Gangrene to the second toe seems to be stable.  The proximal skin with surrounding darkened discoloration seems to be somewhat improved.  Some warmth to the foot today.  Laboratory CBC    Component Value Date/Time   WBC 11.8 (H) 04/04/2020 0150   HGB 10.3 (L) 04/04/2020 0150   HCT 32.4 (L) 04/04/2020 0150   PLT 289 04/04/2020 0150    BMET    Component Value Date/Time   NA 140 04/04/2020 0150   K 3.7 04/04/2020 0150   CL 108 04/04/2020 0150   CO2 22 04/04/2020 0150   GLUCOSE 206 (H) 04/04/2020 0150   BUN 13 04/04/2020 0150   CREATININE 0.61 04/04/2020 0150   CALCIUM 8.6 (L) 04/04/2020 0150   GFRNONAA >60 04/04/2020 0150   GFRAA >60 04/04/2020 0150    Assessment/Planning: Gangrene left second toe with peripheral vascular disease   At this point I would recommend amputation of just the second toe.  I was concerned that she would need a transmetatarsal amputation but some of the surrounding skin looks to be improved at this point with successful revascularization.  I discussed this with the patient and the patient's husband in detail today.  We will plan for surgery tomorrow.  Discussed the risks of possible further amputation down the road if wound complications occur.  They expressed understanding at this point.  Orders will be placed for surgery      Elesa Hacker  04/04/2020, 1:20 PM

## 2020-04-04 NOTE — Progress Notes (Signed)
PROGRESS NOTE    [name]  VPX:[id number] DOB: 10-13-1940 DOA: 03/30/2020 PCP: [name], MD   Brief Narrative:  [name] is a 79 y.o. female with medical history significant of diastolic congestive heart failure, diabetes mellitus, diabetic neuropathy, hypertension, hyperlipidemia, GERD, peripheral vascular disease, CVA presents to the ED at the request of her primary care physician for evaluation of a black necrotic left second toe with presumed cellulitis.  The majority the history was gained by reading the medical record and speaking with the ED physician  Patient is admitted for necrotic left foot 2nd toe with superimposed cellulitis,  Patient is started on IV antibiotics,  Patient was seen by podiatry,  Vascular surgery . Patient high risk for amputation based on clinical appearance.  Patient underwent successful revascularization of the left lower extremity.  Patient with one vessel runoff to the foot.  Podiatry is planning amputation of gangrenous toe tomorrow.  Assessment & Plan:   Active Problems:   Infected wound   Necrotic left second toe with superimposed cellulitis Exact timeframe is unknown. Patient states ~been black for 3 to 4 days, we suspect longer. Sensation decreased,  Patient is known to podiatry as well as [facility].  No osteomyelitis noted on plain film imaging. Clinically not septic by criteria.  Plan: Admit inpatient, MedSurg Empiric vancomycin IV, pharmacy dosing Continue  IVF  Gentle hydration. Blood cultures : No growth so far As needed pain control WOC consult : out of scope of practice. Vascular consulted ,underwent successful revascularization of the left lower extremity.  Patient with one vessel runoff to the foot. Podiatry consulted,   Dr. Vickki Muff planning amputation of gangrenous toe tomorrow.   Chronic diastolic congestive heart failure Does not appear acutely exacerbated. Continue goal-directed medical  therapy Continue Coreg 6.25 twice daily Continue Demadex 20 twice daily No lisinopril noted on home med rec.  Peripheral vascular disease History of CVA. Continue dual antiplatelet therapy with aspirin and Plavix.  Essential hypertension Continue Coreg 6.25 twice daily Continue amlodipine 10 mg daily Continue hydralazine 25 mg p.o. every 8 hours As needed hydralazine  Insulin-dependent diabetes mellitus Diabetic neuropathy Home Lantus dose 18 units daily at bedtime Plan: Start Lantus 18 units nightly Moderate sliding scale Carb modified diet Check A1c Continue gabapentin/pregabalin Consider diabetes coordinator consult if glycemic control becomes difficult  GERD PPI  COPD Appears baseline Not acutely exacerbated Continue home nebulizer/inhaler regimen   DVT prophylaxis: Lovenox Code Status: Full Family Communication: Husband at bedside Disposition Plan: Anticipate return to previous home environment Consults called: Podiatry: Vickki Muff; vascular: Dew Patient is not medically clear requiring possible amputation and further treatment.    Consultants:    Podiatry, Wound care and Vascular surgery.  Procedures:  Antimicrobials:  Anti-infectives (From admission, onward)   Start     Dose/Rate Route Frequency Ordered Stop   04/02/20 1800  vancomycin (VANCOREADY) IVPB 750 mg/150 mL        750 mg 150 mL/hr over 60 Minutes Intravenous Every 12 hours 04/02/20 1332     03/31/20 1400  metroNIDAZOLE (FLAGYL) IVPB 500 mg        500 mg 100 mL/hr over 60 Minutes Intravenous Every 8 hours 03/31/20 1043     03/31/20 1200  cefTRIAXone (ROCEPHIN) 1 g in sodium chloride 0.9 % 100 mL IVPB        1 g 200 mL/hr over 30 Minutes Intravenous Daily 03/31/20 1043     03/31/20 0600  vancomycin (VANCOREADY) IVPB  500 mg/100 mL  Status:  Discontinued        500 mg 100 mL/hr over 60 Minutes Intravenous Every 12 hours 03/30/20 1917 04/02/20 1332   03/30/20 1615  cefTRIAXone  (ROCEPHIN) 1 g in sodium chloride 0.9 % 100 mL IVPB  Status:  Discontinued        1 g 200 mL/hr over 30 Minutes Intravenous Every 24 hours 03/30/20 1607 03/30/20 1706   03/30/20 1615  clindamycin (CLEOCIN) IVPB 600 mg  Status:  Discontinued        600 mg 100 mL/hr over 30 Minutes Intravenous Every 8 hours 03/30/20 1608 03/30/20 1706   03/30/20 1600  vancomycin (VANCOCIN) IVPB 1000 mg/200 mL premix        1,000 mg 200 mL/hr over 60 Minutes Intravenous  Once 03/30/20 1556 03/30/20 2243   03/30/20 1600  clindamycin (CLEOCIN) IVPB 600 mg  Status:  Discontinued        600 mg 100 mL/hr over 30 Minutes Intravenous Every 8 hours 03/30/20 1556 03/31/20 1043     Subjective: Patient was seen and examined at bedside.  No overnight events. She reports feeling better.  She does not want to remove her toe.  Explained to her and husband at bedside in detail that she does need to have removal of the stone.  Objective: Vitals:   04/03/20 2358 04/04/20 0522 04/04/20 0744 04/04/20 0828  BP: (!) 122/50 (!) 122/47 (!) 151/58   Pulse: 88 77 88 85  Resp: 18 16 18 16   Temp: 98.2 F (36.8 C) 97.7 F (36.5 C) 97.8 F (36.6 C)   TempSrc: Oral Oral Oral   SpO2: 98% 99% 98% 97%  Weight:      Height:        Intake/Output Summary (Last 24 hours) at 04/04/2020 1526 Last data filed at 04/04/2020 0543 Gross per 24 hour  Intake --  Output 400 ml  Net -400 ml   Filed Weights   03/30/20 1659  Weight: 66.2 kg    Examination:  General exam: Appears calm and comfortable, lethargic arousable to sternal rub. Respiratory system: Clear to auscultation. Respiratory effort normal. Cardiovascular system: S1 & S2 heard, RRR. No JVD, murmurs, rubs, gallops or clicks. No pedal edema. Gastrointestinal system: Abdomen is nondistended, soft and nontender. No organomegaly or masses felt. Normal bowel sounds heard. Central nervous system: Alert and oriented. No focal neurological deficits. Extremities:  Left LE: cold,  gangrenous toes noted. Skin: No rashes, lesions or ulcers Psychiatry: Judgement and insight appear normal. Mood & affect appropriate.     Data Reviewed: I have personally reviewed following labs and imaging studies  CBC: Recent Labs  Lab 03/30/20 1058 03/30/20 1058 03/31/20 0634 04/01/20 0615 04/02/20 0605 04/03/20 0425 04/04/20 0150  WBC 13.9*   < > 9.4 9.2 9.6 9.2 11.8*  NEUTROABS 10.2*  --   --   --   --   --   --   HGB 12.0   < > 10.8* 10.7* 10.3* 10.9* 10.3*  HCT 37.6   < > 32.8* 33.3* 32.6* 33.8* 32.4*  MCV 83.6   < > 83.0 85.8 84.9 84.7 84.8  PLT 305   < > 284 253 279 288 289   < > = values in this interval not displayed.   Basic Metabolic Panel: Recent Labs  Lab 03/31/20 0634 04/01/20 0615 04/02/20 0605 04/03/20 0425 04/04/20 0150  NA 141 138 138 140 140  K 4.1 4.0 3.3* 3.5 3.7  CL 103  104 104 108 108  CO2 28 26 23 27 22   GLUCOSE 243* 162* 147* 94 206*  BUN 13 13 11 10 13   CREATININE 0.69 0.51 0.53 0.44 0.61  CALCIUM 9.4 8.8* 8.6* 8.8* 8.6*  MG  --  1.9  --   --   --   PHOS  --  3.5  --   --   --    GFR: Estimated Creatinine Clearance: 53 mL/min (by C-G formula based on SCr of 0.61 mg/dL). Liver Function Tests: Recent Labs  Lab 03/30/20 1058 03/31/20 0634 04/01/20 0615  AST 17 13* 14*  ALT 13 10 8   ALKPHOS 69 60 54  BILITOT 0.6 0.9 0.6  PROT 6.6 5.8* 5.7*  ALBUMIN 3.2* 2.9* 2.8*   No results for input(s): LIPASE, AMYLASE in the last 168 hours. No results for input(s): AMMONIA in the last 168 hours. Coagulation Profile: Recent Labs  Lab 03/30/20 2238 03/31/20 0634  INR 1.1 1.1   Cardiac Enzymes: Recent Labs  Lab 03/30/20 1058  CKTOTAL 45   BNP (last 3 results) No results for input(s): PROBNP in the last 8760 hours. HbA1C: No results for input(s): HGBA1C in the last 72 hours. CBG: Recent Labs  Lab 04/03/20 1631 04/03/20 2049 04/03/20 2341 04/04/20 0744 04/04/20 1214  GLUCAP 256* 232* 185* 152* 140*   Lipid Profile: No  results for input(s): CHOL, HDL, LDLCALC, TRIG, CHOLHDL, LDLDIRECT in the last 72 hours. Thyroid Function Tests: No results for input(s): TSH, T4TOTAL, FREET4, T3FREE, THYROIDAB in the last 72 hours. Anemia Panel: No results for input(s): VITAMINB12, FOLATE, FERRITIN, TIBC, IRON, RETICCTPCT in the last 72 hours. Sepsis Labs: Recent Labs  Lab 03/30/20 1058  LATICACIDVEN 1.4    Recent Results (from the past 240 hour(s))  Culture, blood (Routine x 2)     Status: None   Collection Time: 03/30/20 10:58 AM   Specimen: BLOOD  Result Value Ref Range Status   Specimen Description BLOOD RIGHT HAND  Final   Special Requests   Final    BOTTLES DRAWN AEROBIC AND ANAEROBIC Blood Culture adequate volume   Culture   Final    NO GROWTH 5 DAYS Performed at [facility], [address], [place], [place] [address]    Report Status 04/04/2020 FINAL  Final  SARS Coronavirus 2 by RT PCR (hospital order, performed in [place] hospital lab) Nasopharyngeal Nasopharyngeal Swab     Status: None   Collection Time: 03/30/20  6:07 PM   Specimen: Nasopharyngeal Swab  Result Value Ref Range Status   SARS Coronavirus 2 NEGATIVE NEGATIVE Final    Comment: (NOTE) SARS-CoV-2 target nucleic acids are NOT DETECTED.  The SARS-CoV-2 RNA is generally detectable in upper and lower respiratory specimens during the acute phase of infection. The lowest concentration of SARS-CoV-2 viral copies this assay can detect is 250 copies / mL. A negative result does not preclude SARS-CoV-2 infection and should not be used as the sole basis for treatment or other patient management decisions.  A negative result may occur with improper specimen collection / handling, submission of specimen other than nasopharyngeal swab, presence of viral mutation(s) within the areas targeted by this assay, and inadequate number of viral copies (<250 copies / mL). A negative result must be combined with clinical observations, patient  history, and epidemiological information.  Fact Sheet for Patients:   StrictlyIdeas.no  Fact Sheet for Healthcare Providers: BankingDealers.co.za  This test is not yet approved or  cleared by the [place]  FDA and has been authorized for detection and/or diagnosis of SARS-CoV-2 by FDA under an Emergency Use Authorization (EUA).  This EUA will remain in effect (meaning this test can be used) for the duration of the COVID-19 declaration under Section 564(b)(1) of the Act, 21 U.S.C. section 360bbb-3(b)(1), unless the authorization is terminated or revoked sooner.  Performed at [facility], [address] [place], [place] [address]   MRSA PCR Screening     Status: None   Collection Time: 03/31/20  4:03 AM   Specimen: Nasopharyngeal  Result Value Ref Range Status   MRSA by PCR NEGATIVE NEGATIVE Final    Comment:        The GeneXpert MRSA Assay (FDA approved for NASAL specimens only), is one component of a comprehensive MRSA colonization surveillance program. It is not intended to diagnose MRSA infection nor to guide or monitor treatment for MRSA infections. Performed at [facility], [address], [place], [place] [address]     Radiology Studies: PERIPHERAL VASCULAR CATHETERIZATION  Result Date: 04/03/2020 See op note   Scheduled Meds: . amLODipine  10 mg Oral Daily  . aspirin EC  81 mg Oral Daily  . atorvastatin  20 mg Oral Daily  . budesonide  0.25 mg Nebulization BID  . carvedilol  6.25 mg Oral BID WC  . clopidogrel  75 mg Oral Q breakfast  . docusate sodium  100 mg Oral BID  . enoxaparin (LOVENOX) injection  40 mg Subcutaneous Q24H  . folic acid  1 mg Oral Daily  . gabapentin  300 mg Oral QPM  . hydrALAZINE  25 mg Oral Q8H  . insulin aspart  0-15 Units Subcutaneous TID WC  . insulin aspart  0-5 Units Subcutaneous QHS  . insulin detemir  10 Units Subcutaneous QHS  . isosorbide dinitrate   30 mg Oral Daily  . loratadine  10 mg Oral Daily  . magnesium oxide  400 mg Oral BID  . modafinil  200 mg Oral Daily  . pantoprazole  40 mg Oral Daily  . torsemide  20 mg Oral BID   Continuous Infusions: . sodium chloride 75 mL/hr at 04/04/20 0524  . cefTRIAXone (ROCEPHIN)  IV 1 g (04/04/20 1011)  . metronidazole 500 mg (04/04/20 1523)  . vancomycin 750 mg (04/04/20 0527)     LOS: 5 days    Time spent: 25 mins    [name] Fleener, MD [facility]   If 7PM-7AM, please contact night-coverage

## 2020-04-04 NOTE — Progress Notes (Addendum)
Pharmacy Antibiotic Note  [name] is a 79 y.o. female with history of diabetes and peripheral vascular disease admitted on 03/30/2020 with gangrenous left second toe.  Pharmacy has been consulted for vancomycin dosing. Patient is also on ceftriaxone and metronidazole. Vascular has been consulted for potential revascularization procedure. Podiatry also following. Patient is at high risk for amputation.  8/29 Vanc trough was 14 (~12 hour post dose), slightly below goal range. Vanc dose was adjusted  Today, 04/04/20  --Day # 6 antibiotics --Patient is afebrile, WBC elevated at 11.8 --Scr trending up  Plan: Will continue vancomycin 750 mg q12H.   Patient is scheduled to get second toe amputated 9/1. If vanc is not discontinued, will plan to order a trough  Goal trough 15-20.    Height: 5\' 5"  (165.1 cm) Weight: 66.2 kg (146 lb) IBW/kg (Calculated) : 57  Temp (24hrs), Avg:97.8 F (36.6 C), Min:97.5 F (36.4 C), Max:98.2 F (36.8 C)  Recent Labs  Lab 03/30/20 1058 03/30/20 1058 03/31/20 0634 04/01/20 0615 04/02/20 0605 04/03/20 0425 04/04/20 0150  WBC 13.9*   < > 9.4 9.2 9.6 9.2 11.8*  CREATININE 0.54   < > 0.69 0.51 0.53 0.44 0.61  LATICACIDVEN 1.4  --   --   --   --   --   --   VANCOTROUGH  --   --   --   --  14*  --   --    < > = values in this interval not displayed.    Estimated Creatinine Clearance: 53 mL/min (by C-G formula based on SCr of 0.61 mg/dL).    Allergies  Allergen Reactions   Iodine Anaphylaxis   Penicillins Anaphylaxis    Tolerates ceftriaxone, cefazolin Did it involve swelling of the face/tongue/throat, SOB, or low BP? Unknown Did it involve sudden or severe rash/hives, skin peeling, or any reaction on the inside of your mouth or nose? Unknown Did you need to seek medical attention at a hospital or doctor's office? Unknown When did it last happen?Unknown If all above answers are NO, may proceed with cephalosporin use.    Shellfish  Allergy Anaphylaxis   Sulfa Antibiotics Anaphylaxis   Sulfacetamide Sodium Anaphylaxis   Sulfasalazine Anaphylaxis   Eggs Or Egg-Derived Products     Other reaction(s): SHORTNESS OF BREATH   Morphine And Related Other (See Comments)    Altered mental status    Antimicrobials this admission: 8/26 clindamycin  >> 8/27 8/26 vancomycin >>  8/27 ceftriaxone >> 8/27 metronidazole >>  Dose adjustments this admission: 8/29 Vanc 500mg  IV Q12h to Vanc 750mg  IV Q12h  Microbiology results: 8/26 BCx: negative 8/27 MRSA PCR: negative  Thank you for allowing pharmacy to be a part of this patients care.  Sherilyn Banker, PharmD Pharmacy Resident  04/04/2020 9:25 AM

## 2020-04-05 ENCOUNTER — Encounter: Payer: Self-pay | Admitting: Internal Medicine

## 2020-04-05 DIAGNOSIS — E1149 Type 2 diabetes mellitus with other diabetic neurological complication: Secondary | ICD-10-CM

## 2020-04-05 DIAGNOSIS — E109 Type 1 diabetes mellitus without complications: Secondary | ICD-10-CM | POA: Diagnosis present

## 2020-04-05 DIAGNOSIS — I10 Essential (primary) hypertension: Secondary | ICD-10-CM

## 2020-04-05 DIAGNOSIS — E1140 Type 2 diabetes mellitus with diabetic neuropathy, unspecified: Secondary | ICD-10-CM | POA: Diagnosis present

## 2020-04-05 DIAGNOSIS — I739 Peripheral vascular disease, unspecified: Secondary | ICD-10-CM

## 2020-04-05 DIAGNOSIS — I96 Gangrene, not elsewhere classified: Secondary | ICD-10-CM

## 2020-04-05 DIAGNOSIS — I5032 Chronic diastolic (congestive) heart failure: Secondary | ICD-10-CM

## 2020-04-05 LAB — BASIC METABOLIC PANEL
Anion gap: 8 (ref 5–15)
BUN: 11 mg/dL (ref 8–23)
CO2: 24 mmol/L (ref 22–32)
Calcium: 8.7 mg/dL — ABNORMAL LOW (ref 8.9–10.3)
Chloride: 107 mmol/L (ref 98–111)
Creatinine, Ser: 0.64 mg/dL (ref 0.44–1.00)
GFR calc Af Amer: >60 mL/min (ref >60)
GFR calc non Af Amer: >60 mL/min (ref >60)
Glucose, Bld: 178 mg/dL — ABNORMAL HIGH (ref 70–99)
Potassium: 3.5 mmol/L (ref 3.5–5.1)
Sodium: 139 mmol/L (ref 135–145)

## 2020-04-05 LAB — CBC
HCT: 31.8 % — ABNORMAL LOW (ref 36.0–46.0)
Hemoglobin: 10.4 g/dL — ABNORMAL LOW (ref 12.0–15.0)
MCH: 27.3 pg (ref 26.0–34.0)
MCHC: 32.7 g/dL (ref 30.0–36.0)
MCV: 83.5 fL (ref 80.0–100.0)
Platelets: 270 10*3/uL (ref 150–400)
RBC: 3.81 MIL/uL — ABNORMAL LOW (ref 3.87–5.11)
RDW: 15.4 % (ref 11.5–15.5)
WBC: 10.3 10*3/uL (ref 4.0–10.5)
nRBC: 0 % (ref 0.0–0.2)

## 2020-04-05 LAB — MAGNESIUM: Magnesium: 2.1 mg/dL (ref 1.7–2.4)

## 2020-04-05 LAB — GLUCOSE, CAPILLARY
Glucose-Capillary: 169 mg/dL — ABNORMAL HIGH (ref 70–99)
Glucose-Capillary: 136 mg/dL — ABNORMAL HIGH (ref 70–99)
Glucose-Capillary: 153 mg/dL — ABNORMAL HIGH (ref 70–99)
Glucose-Capillary: 114 mg/dL — ABNORMAL HIGH (ref 70–99)

## 2020-04-05 LAB — PHOSPHORUS: Phosphorus: 2.7 mg/dL (ref 2.5–4.6)

## 2020-04-05 LAB — VANCOMYCIN, TROUGH: Vancomycin Tr: 22 ug/mL (ref 15–20)

## 2020-04-05 MED ORDER — VANCOMYCIN HCL 500 MG/100ML IV SOLN
500.0000 mg | Freq: Two times a day (BID) | INTRAVENOUS | Status: DC
  Administered 2020-04-05 – 2020-04-07 (×4): 500 mg via INTRAVENOUS
  Filled 2020-04-05 (×5): qty 100

## 2020-04-05 MED ORDER — CHLORHEXIDINE GLUCONATE 4 % EX LIQD
60.0000 mL | Freq: Once | CUTANEOUS | Status: AC
  Administered 2020-04-05: 4 via TOPICAL

## 2020-04-05 MED ORDER — POTASSIUM CHLORIDE CRYS ER 20 MEQ PO TBCR
40.0000 meq | EXTENDED_RELEASE_TABLET | Freq: Once | ORAL | Status: DC
  Filled 2020-04-05: qty 2

## 2020-04-05 MED ORDER — PROPOFOL 10 MG/ML IV BOLUS
INTRAVENOUS | Status: AC
  Filled 2020-04-05: qty 40

## 2020-04-05 MED ORDER — BUDESONIDE 0.25 MG/2ML IN SUSP
0.5000 mg | Freq: Two times a day (BID) | RESPIRATORY_TRACT | Status: DC
  Administered 2020-04-05 – 2020-04-07 (×4): 0.5 mg via RESPIRATORY_TRACT
  Filled 2020-04-05 (×5): qty 4

## 2020-04-05 MED ORDER — FENTANYL CITRATE (PF) 100 MCG/2ML IJ SOLN
INTRAMUSCULAR | Status: AC
  Filled 2020-04-05: qty 2

## 2020-04-05 NOTE — Progress Notes (Signed)
Pharmacy Antibiotic Note  [name] is a 79 y.o. female with history of diabetes and peripheral vascular disease admitted on 03/30/2020 with gangrenous left second toe.  Pharmacy has been consulted for vancomycin dosing. Patient is also on ceftriaxone and metronidazole. Vascular has been consulted for potential revascularization procedure. Podiatry also following. Patient is at high risk for amputation.  8/29 Vanc trough was 14 (~12 hour post dose), slightly below goal range. Vanc dose was adjusted  Today, 04/05/20  --Day # 6 antibiotics --Patient is afebrile --WBC WNL --Scr trending up slowly --Vancomycin trough this morning = 22 mcg/mL on 750mg  IV q12h.  Level was drawn 1h early so actual level lower but will at upper-end of goal  Plan: --Based on trough level and Scr inching upward, change to vancomycin 500mg  IV q12h --Patient is scheduled to get second toe amputated 9/1.  --Goal trough 10-15 as no definitive osteomyelitis --Await OR cultures but has been on antibiotics for nearly 7 days   Height: 5\' 5"  (165.1 cm) Weight: 66.2 kg (146 lb) IBW/kg (Calculated) : 57  Temp (24hrs), Avg:97.7 F (36.5 C), Min:97 F (36.1 C), Max:98.2 F (36.8 C)  Recent Labs  Lab 03/30/20 1058 03/31/20 0634 04/01/20 0615 04/02/20 0605 04/03/20 0425 04/04/20 0150 04/05/20 0430  WBC 13.9*   < > 9.2 9.6 9.2 11.8* 10.3  CREATININE 0.54   < > 0.51 0.53 0.44 0.61 0.64  LATICACIDVEN 1.4  --   --   --   --   --   --   VANCOTROUGH  --   --   --  14*  --   --  22*   < > = values in this interval not displayed.    Estimated Creatinine Clearance: 53 mL/min (by C-G formula based on SCr of 0.64 mg/dL).    Allergies  Allergen Reactions  . Iodine Anaphylaxis  . Penicillins Anaphylaxis    Tolerates ceftriaxone, cefazolin Did it involve swelling of the face/tongue/throat, SOB, or low BP? Unknown Did it involve sudden or severe rash/hives, skin peeling, or any reaction on the inside of your mouth or  nose? Unknown Did you need to seek medical attention at a hospital or doctor's office? Unknown When did it last happen?Unknown If all above answers are "NO", may proceed with cephalosporin use.   . Shellfish Allergy Anaphylaxis  . Sulfa Antibiotics Anaphylaxis  . Sulfacetamide Sodium Anaphylaxis  . Sulfasalazine Anaphylaxis  . Eggs Or Egg-Derived Products     Other reaction(s): SHORTNESS OF BREATH  . Morphine And Related Other (See Comments)    Altered mental status    Antimicrobials this admission: 8/26 clindamycin  >> 8/27 8/26 vancomycin >>  8/27 ceftriaxone >> 8/27 metronidazole >>  Dose adjustments this admission: 8/29 Vanc 500mg  IV Q12h to Vanc 750mg  IV Q12h 9/1 vancomycin 750mg  IV q12h ot 500mg  IV q12h for VT = 22  Microbiology results: 8/26 BCx: negative 8/27 MRSA PCR: negative  Thank you for allowing pharmacy to be a part of this patient's care.  Doreene Eland, PharmD, BCPS.   Work Cell: [phone] 04/05/2020 12:54 PM

## 2020-04-05 NOTE — Progress Notes (Signed)
PROGRESS NOTE    [name]  VWU:[id number] DOB: 1941/01/05 DOA: 03/30/2020 PCP: [name], MD    Chief Complaint  Patient presents with  . Toe Pain    Brief Narrative:  Tiney Zipper Huntis a 79 y.o.femalewith medical history significant ofdiastolic congestive heart failure, diabetes mellitus, diabetic neuropathy, hypertension, hyperlipidemia, GERD, peripheral vascular disease, CVA presents to the ED at the request of her primary care physician for evaluation of a black necrotic left second toe with presumed cellulitis. The majority the history was gained by reading the medical record and speaking with the ED physician  Patient is admitted for necrotic left foot 2nd toe with superimposed cellulitis,  Patient is started on IV antibiotics,  Patient was seen by podiatry,  Vascular surgery . Patient high risk for amputation based on clinical appearance.  Patient underwent successful revascularization of the left lower extremity. Patient with onevessel runoff to the foot.  Podiatry is planning amputation of gangrenous toe today 04/05/2020.   Assessment & Plan:   Active Problems:   HTN (hypertension)   DM2 (diabetes mellitus, type 2) (HCC)   PVD (peripheral vascular disease) ([place])   Infected wound   Necrotic toes (HCC)   Insulin dependent diabetes mellitus type IA ([place])   Diabetic neuropathy ([place])  1 necrotic left second toe with superimposed cellulitis Questionable etiology.  Patient with decreased sensation, timeframe unknown.  Plain films of the foot negative for osteomyelitis.  Patient has been seen by vascular surgery and podiatry.  Patient underwent lower extremity angiogram per vascular surgery with endovascular i intervention to left lower extremity/successful revascularization with one-vessel runoff to the foot.  Patient currently on aspirin, Plavix and statin.  Coumadin on hold.  Patient assessed by podiatry and patient for amputation today.  Continue IV antibiotics.  Will  likely continue IV antibiotics for 24 to 48 hours postop.  Per podiatry and vascular surgery.  2.  Chronic diastolic CHF Currently stable.  Euvolemic.  Continue home regimen of Coreg, Demadex aspirin, statin, Isordil.  Outpatient follow-up with cardiology.  3.  Peripheral vascular disease/history of CVA Stable.  Continue dual antiplatelet therapy with aspirin, Plavix.  Continue statin.  Vascular surgery following.  4.  Hypertension Continue Coreg, Norvasc, hydralazine.  5.  Insulin-dependent diabetes mellitus/diabetic neuropathy CBG of 169 this morning.  Patient currently n.p.o. in anticipation of surgery later on today.  Continue current regimen of Levemir, sliding scale insulin.  Postop once patient tolerating a diet if CBGs increase will increase Levemir back to home regimen of 18 units daily.  Continue Neurontin.  Follow.    DVT prophylaxis: Lovenox Code Status: Full Family Communication: Updated patient.  No family at bedside. Disposition:   Status is: Inpatient    Dispo: The patient is from: Home              Anticipated d/c is to: To be determined.              Anticipated d/c date is: To be determined.              Patient currently for amputation later on today, currently not stable for discharge.       Consultants:   Vascular surgery: Dr. Lucky Cowboy 03/31/2020  Wound care  Podiatry: Dr. Vickki Muff 03/30/2020  Procedures:   Chest x-ray 03/30/2020  Plain films of the left foot 03/30/2020  Lower extremity angiogram 04/03/2020 per Dr. Lucky Cowboy vascular surgery  Antimicrobials:   IV clindamycin 03/30/2020>>>> 03/31/2020  IV vancomycin 03/30/2020>>>>  IV  Rocephin 03/31/2020>>>>>  IV Flagyl 03/31/2020 >>>>   Subjective: Laying in bed.  Complaining of feeling cold.  Denies any chest pain or shortness of breath.  No abdominal pain.  Asking what time surgery is.  Objective: Vitals:   04/05/20 0006 04/05/20 0550 04/05/20 0809 04/05/20 0810  BP: (!) 102/57 (!) 162/58  (!)  172/62  Pulse: 77 72  77  Resp: 18 18  16   Temp: 98.2 F (36.8 C)   97.6 F (36.4 C)  TempSrc:    Oral  SpO2: 98% 98% 99% 99%  Weight:      Height:        Intake/Output Summary (Last 24 hours) at 04/05/2020 1424 Last data filed at 04/05/2020 1145 Gross per 24 hour  Intake --  Output 3300 ml  Net -3300 ml   Filed Weights   03/30/20 1659  Weight: 66.2 kg    Examination:  General exam: NAD Respiratory system: Clear to auscultation. Respiratory effort normal. Cardiovascular system: S1 & S2 heard, RRR. No JVD, murmurs, rubs, gallops or clicks. No pedal edema. Gastrointestinal system: Abdomen is nondistended, soft and nontender. No organomegaly or masses felt. Normal bowel sounds heard. Central nervous system: Alert and oriented. No focal neurological deficits. Extremities: Left second toe gangrenous, some black discoloration right great toe.  Skin: No rashes, lesions or ulcers Psychiatry: Judgement and insight appear normal. Mood & affect appropriate.     Data Reviewed: I have personally reviewed following labs and imaging studies  CBC: Recent Labs  Lab 03/30/20 1058 03/31/20 0634 04/01/20 0615 04/02/20 0605 04/03/20 0425 04/04/20 0150 04/05/20 0430  WBC 13.9*   < > 9.2 9.6 9.2 11.8* 10.3  NEUTROABS 10.2*  --   --   --   --   --   --   HGB 12.0   < > 10.7* 10.3* 10.9* 10.3* 10.4*  HCT 37.6   < > 33.3* 32.6* 33.8* 32.4* 31.8*  MCV 83.6   < > 85.8 84.9 84.7 84.8 83.5  PLT 305   < > 253 279 288 289 270   < > = values in this interval not displayed.    Basic Metabolic Panel: Recent Labs  Lab 04/01/20 0615 04/02/20 0605 04/03/20 0425 04/04/20 0150 04/05/20 0430  NA 138 138 140 140 139  K 4.0 3.3* 3.5 3.7 3.5  CL 104 104 108 108 107  CO2 26 23 27 22 24   GLUCOSE 162* 147* 94 206* 178*  BUN 13 11 10 13 11   CREATININE 0.51 0.53 0.44 0.61 0.64  CALCIUM 8.8* 8.6* 8.8* 8.6* 8.7*  MG 1.9  --   --   --  2.1  PHOS 3.5  --   --   --  2.7    GFR: Estimated  Creatinine Clearance: 53 mL/min (by C-G formula based on SCr of 0.64 mg/dL).  Liver Function Tests: Recent Labs  Lab 03/30/20 1058 03/31/20 0634 04/01/20 0615  AST 17 13* 14*  ALT 13 10 8   ALKPHOS 69 60 54  BILITOT 0.6 0.9 0.6  PROT 6.6 5.8* 5.7*  ALBUMIN 3.2* 2.9* 2.8*    CBG: Recent Labs  Lab 04/04/20 1652 04/04/20 2153 04/04/20 2156 04/05/20 0809 04/05/20 1149  GLUCAP 250* 222* 224* 169* 136*     Recent Results (from the past 240 hour(s))  Culture, blood (Routine x 2)     Status: None   Collection Time: 03/30/20 10:58 AM   Specimen: BLOOD  Result Value Ref Range Status   Specimen  Description BLOOD RIGHT HAND  Final   Special Requests   Final    BOTTLES DRAWN AEROBIC AND ANAEROBIC Blood Culture adequate volume   Culture   Final    NO GROWTH 5 DAYS Performed at [facility], [address] [place], [place] [address]    Report Status 04/04/2020 FINAL  Final  SARS Coronavirus 2 by RT PCR (hospital order, performed in [facility] hospital lab) Nasopharyngeal Nasopharyngeal Swab     Status: None   Collection Time: 03/30/20  6:07 PM   Specimen: Nasopharyngeal Swab  Result Value Ref Range Status   SARS Coronavirus 2 NEGATIVE NEGATIVE Final    Comment: (NOTE) SARS-CoV-2 target nucleic acids are NOT DETECTED.  The SARS-CoV-2 RNA is generally detectable in upper and lower respiratory specimens during the acute phase of infection. The lowest concentration of SARS-CoV-2 viral copies this assay can detect is 250 copies / mL. A negative result does not preclude SARS-CoV-2 infection and should not be used as the sole basis for treatment or other patient management decisions.  A negative result may occur with improper specimen collection / handling, submission of specimen other than nasopharyngeal swab, presence of viral mutation(s) within the areas targeted by this assay, and inadequate number of viral copies (<250 copies / mL). A negative result must be  combined with clinical observations, patient history, and epidemiological information.  Fact Sheet for Patients:   StrictlyIdeas.no  Fact Sheet for Healthcare Providers: BankingDealers.co.za  This test is not yet approved or  cleared by the [place] FDA and has been authorized for detection and/or diagnosis of SARS-CoV-2 by FDA under an Emergency Use Authorization (EUA).  This EUA will remain in effect (meaning this test can be used) for the duration of the COVID-19 declaration under Section 564(b)(1) of the Act, 21 U.S.C. section 360bbb-3(b)(1), unless the authorization is terminated or revoked sooner.  Performed at [facility], [address] [place], [place] [address]   MRSA PCR Screening     Status: None   Collection Time: 03/31/20  4:03 AM   Specimen: Nasopharyngeal  Result Value Ref Range Status   MRSA by PCR NEGATIVE NEGATIVE Final    Comment:        The GeneXpert MRSA Assay (FDA approved for NASAL specimens only), is one component of a comprehensive MRSA colonization surveillance program. It is not intended to diagnose MRSA infection nor to guide or monitor treatment for MRSA infections. Performed at [facility], [address] [place], [place] [address]          Radiology Studies: No results found.      Scheduled Meds: . amLODipine  10 mg Oral Daily  . aspirin EC  81 mg Oral Daily  . atorvastatin  20 mg Oral Daily  . budesonide  0.5 mg Nebulization BID  . carvedilol  6.25 mg Oral BID WC  . clopidogrel  75 mg Oral Q breakfast  . docusate sodium  100 mg Oral BID  . enoxaparin (LOVENOX) injection  40 mg Subcutaneous Q24H  . folic acid  1 mg Oral Daily  . gabapentin  300 mg Oral QPM  . hydrALAZINE  25 mg Oral Q8H  . insulin aspart  0-15 Units Subcutaneous TID WC  . insulin aspart  0-5 Units Subcutaneous QHS  . insulin detemir  10 Units Subcutaneous QHS  . isosorbide  dinitrate  30 mg Oral Daily  . loratadine  10 mg Oral Daily  . magnesium oxide  400 mg  Oral BID  . modafinil  200 mg Oral Daily  . pantoprazole  40 mg Oral Daily  . potassium chloride  40 mEq Oral Once  . torsemide  20 mg Oral BID   Continuous Infusions: . cefTRIAXone (ROCEPHIN)  IV 1 g (04/05/20 1138)  . metronidazole 500 mg (04/05/20 0549)  . vancomycin       LOS: 6 days    Time spent: 35 minutes    Irine Seal, MD [facility]   To contact the attending provider between 7A-7P or the covering provider during after hours 7P-7A, please log into the web site [email or web address] and access using universal [place] password for that web site. If you do not have the password, please call the hospital operator.  04/05/2020, 2:24 PM

## 2020-04-05 NOTE — Anesthesia Preprocedure Evaluation (Addendum)
Anesthesia Evaluation  Patient identified by MRN, date of birth, ID band Patient awake    Reviewed: Allergy & Precautions, H&P , NPO status , Patient's Chart, lab work & pertinent test results  History of Anesthesia Complications (+) history of anesthetic complications ("hard to wake up")  Airway Mallampati: II  TM Distance: >3 FB     Dental  (+) Missing   Pulmonary shortness of breath and with exertion,    breath sounds clear to auscultation       Cardiovascular hypertension, + Peripheral Vascular Disease and +CHF  + Valvular Problems/Murmurs AS  Rhythm:regular Rate:Normal + Systolic murmurs Echo 02/03/62: 1. The left ventricle has low normal systolic function, with an ejection  fraction of 50-55%. The cavity size was normal. There is moderately  increased left ventricular wall thickness. Left ventricular diastolic  Doppler parameters are indeterminate.  2. The right ventricle has normal systolic function. The cavity was  normal. There is no increase in right ventricular wall thickness.  3. The aortic valve is tricuspid. Aortic valve regurgitation was not  assessed by color flow Doppler. Mild-moderate stenosis of the aortic  valve.    Neuro/Psych  Neuromuscular disease CVA (cognitive deficits, oriented to self only, truncal weakness, non-ambulatory), Residual Symptoms negative psych ROS   GI/Hepatic Neg liver ROS, GERD  Controlled,  Endo/Other  diabetes  Renal/GU      Musculoskeletal   Abdominal   Peds  Hematology  (+) Blood dyscrasia, anemia ,   Anesthesia Other Findings Past Medical History: No date: Acute heart failure (HCC) No date: Aortic stenosis No date: CHF (congestive heart failure) (HCC) No date: Complication of anesthesia     Comment:  Hard to wake patient up after having anesthesia No date: Diabetes mellitus without complication (HCC) No date: Diabetic neuropathy (HCC)     Comment:  Takes  Lyrica No date: Edema of both legs     Comment:  Takes Lasix No date: GERD (gastroesophageal reflux disease) No date: High cholesterol No date: HTN (hypertension) No date: Hypertension No date: PAD (peripheral artery disease) (HCC) No date: Shortness of breath dyspnea No date: Spasm of back muscles No date: Stroke [facility]) No date: Wound, open     Comment:  Right great toe  Past Surgical History: 02/28/2016: BYPASS GRAFT POPLITEAL TO TIBIAL; Right     Comment:  Procedure: BYPASS GRAFT RIGHT BELOW KNEE POPLITEAL TO               PERONEAL USING REVERSED RIGHT GREATER SAPHENOUS VEIN;                Surgeon: Conrad Vinton, MD;  Location: [place];  Service:               Vascular;  Laterality: Right; No date: CYST EXCISION     Comment:  Abdomen No date: EYE SURGERY; Bilateral     Comment:  Cataract removal 02/04/2014: INTRAMEDULLARY (IM) NAIL INTERTROCHANTERIC; Left     Comment:  Procedure: INTRAMEDULLARY (IM) NAIL INTERTROCHANTRIC               FEMORAL;  Surgeon: Mauri Pole, MD;  Location: [place];               Service: Orthopedics;  Laterality: Left; 03/01/2016: IR GENERIC HISTORICAL     Comment:  IR ANGIO INTRA EXTRACRAN SEL COM CAROTID INNOMINATE UNI               R MOD SED 03/01/2016 [name], MD [facility]  RAD 03/01/2016: IR GENERIC HISTORICAL     Comment:  IR ENDOVASC INTRACRANIAL INF OTHER THAN THROMBO ART INC               DIAG ANGIO 03/01/2016 [name], MD [facility] RAD 03/01/2016: IR GENERIC HISTORICAL     Comment:  IR INTRAVSC STENT CERV CAROTID W/O EMB-PROT MOD SED INC               ANGIO 03/01/2016 [name], MD [facility] RAD 06/03/2016: IR GENERIC HISTORICAL     Comment:  IR RADIOLOGIST EVAL & MGMT 06/03/2016 [facility] RAD 02/09/2019: LOWER EXTREMITY ANGIOGRAPHY; Left     Comment:  Procedure: LOWER EXTREMITY ANGIOGRAPHY;  Surgeon:               Katha Cabal, MD;  Location: [place] [facility];               Service: Cardiovascular;  Laterality:  Left; 03/10/2019: LOWER EXTREMITY ANGIOGRAPHY; Left     Comment:  Procedure: LOWER EXTREMITY ANGIOGRAPHY;  Surgeon:               Katha Cabal, MD;  Location: [place] [facility];               Service: Cardiovascular;  Laterality: Left; 04/27/2019: LOWER EXTREMITY ANGIOGRAPHY; Left     Comment:  Procedure: LOWER EXTREMITY ANGIOGRAPHY;  Surgeon:               Katha Cabal, MD;  Location: [place] [facility];               Service: Cardiovascular;  Laterality: Left; 06/08/2019: LOWER EXTREMITY ANGIOGRAPHY; Left     Comment:  Procedure: Lower Extremity Angiography;  Surgeon:               Katha Cabal, MD;  Location: [place] [facility];               Service: Cardiovascular;  Laterality: Left; 02/08/2014: ORIF TOE FRACTURE; Right     Comment:  Procedure: OPEN REDUCTION INTERNAL FIXATION Right               METATARSAL  FRACTURE ;  Surgeon: Wylene Simmer, MD;                Location: [place];  Service: Orthopedics;  Laterality:               Right; 12/28/2015: PERIPHERAL VASCULAR CATHETERIZATION; N/A     Comment:  Procedure: Abdominal Aortogram w/Lower Extremity;                Surgeon: Conrad [place]erry, MD;  Location: [place] [facility];              Service: Cardiovascular;  Laterality: N/A; 03/01/2016: RADIOLOGY WITH ANESTHESIA; N/A     Comment:  Procedure: RADIOLOGY WITH ANESTHESIA;  Surgeon: [name], MD;  Location: [place];  Service: Radiology;                Laterality: N/A; 02/28/2016: VEIN HARVEST; Right     Comment:  Procedure: RIGHT GREATER SAPHENOUS VEIN HARVEST;                Surgeon: Conrad Confluence, MD;  Location: [place];  Service:               Vascular;  Laterality: Right;  BMI  Body Mass Index: 27.62 kg/m      Reproductive/Obstetrics negative OB ROS                          Anesthesia Physical  Anesthesia Plan  ASA: III  Anesthesia Plan: General   Post-op Pain Management:    Induction: Intravenous  PONV  Risk Score and Plan: Propofol infusion  Airway Management Planned: Simple Face Mask  Additional Equipment:   Intra-op Plan:   Post-operative Plan:   Informed Consent: I have reviewed the patients History and Physical, chart, labs and discussed the procedure including the risks, benefits and alternatives for the proposed anesthesia with the patient or authorized representative who has indicated his/her understanding and acceptance.     Dental Advisory Given  Plan Discussed with: Anesthesiologist, CRNA and Surgeon  Anesthesia Plan Comments: (Consent obtained from patient and husband at bedside.  KR)     Anesthesia Quick Evaluation

## 2020-04-06 ENCOUNTER — Encounter: Payer: Self-pay | Admitting: Internal Medicine

## 2020-04-06 ENCOUNTER — Encounter
Admission: EM | Disposition: A | Discharge status: Home Health | Payer: Self-pay | Source: Home / Self Care | Attending: Family Medicine

## 2020-04-06 ENCOUNTER — Inpatient Hospital Stay: Payer: Medicare HMO | Admitting: Anesthesiology

## 2020-04-06 ENCOUNTER — Inpatient Hospital Stay: Payer: Medicare HMO

## 2020-04-06 DIAGNOSIS — Z794 Long term (current) use of insulin: Secondary | ICD-10-CM

## 2020-04-06 DIAGNOSIS — E1159 Type 2 diabetes mellitus with other circulatory complications: Secondary | ICD-10-CM

## 2020-04-06 HISTORY — PX: AMPUTATION TOE: SHX6595

## 2020-04-06 LAB — BASIC METABOLIC PANEL
Anion gap: 8 (ref 5–15)
BUN: 8 mg/dL (ref 8–23)
CO2: 27 mmol/L (ref 22–32)
Calcium: 9.2 mg/dL (ref 8.9–10.3)
Chloride: 102 mmol/L (ref 98–111)
Creatinine, Ser: 0.51 mg/dL (ref 0.44–1.00)
GFR calc Af Amer: >60 mL/min (ref >60)
GFR calc non Af Amer: >60 mL/min (ref >60)
Glucose, Bld: 189 mg/dL — ABNORMAL HIGH (ref 70–99)
Potassium: 4 mmol/L (ref 3.5–5.1)
Sodium: 137 mmol/L (ref 135–145)

## 2020-04-06 LAB — CBC WITH DIFFERENTIAL/PLATELET
Abs Immature Granulocytes: 0.05 10*3/uL (ref 0.00–0.07)
Basophils Absolute: 0.1 10*3/uL (ref 0.0–0.1)
Basophils Relative: 1 %
Eosinophils Absolute: 0.3 10*3/uL (ref 0.0–0.5)
Eosinophils Relative: 3 %
HCT: 37.2 % (ref 36.0–46.0)
Hemoglobin: 11.9 g/dL — ABNORMAL LOW (ref 12.0–15.0)
Immature Granulocytes: 0 %
Lymphocytes Relative: 21 %
Lymphs Abs: 2.4 10*3/uL (ref 0.7–4.0)
MCH: 27.4 pg (ref 26.0–34.0)
MCHC: 32 g/dL (ref 30.0–36.0)
MCV: 85.5 fL (ref 80.0–100.0)
Monocytes Absolute: 0.8 10*3/uL (ref 0.1–1.0)
Monocytes Relative: 7 %
Neutro Abs: 8 10*3/uL — ABNORMAL HIGH (ref 1.7–7.7)
Neutrophils Relative %: 68 %
Platelets: 286 10*3/uL (ref 150–400)
RBC: 4.35 MIL/uL (ref 3.87–5.11)
RDW: 15.6 % — ABNORMAL HIGH (ref 11.5–15.5)
WBC: 11.7 10*3/uL — ABNORMAL HIGH (ref 4.0–10.5)
nRBC: 0 % (ref 0.0–0.2)

## 2020-04-06 LAB — GLUCOSE, CAPILLARY
Glucose-Capillary: 167 mg/dL — ABNORMAL HIGH (ref 70–99)
Glucose-Capillary: 144 mg/dL — ABNORMAL HIGH (ref 70–99)
Glucose-Capillary: 164 mg/dL — ABNORMAL HIGH (ref 70–99)
Glucose-Capillary: 167 mg/dL — ABNORMAL HIGH (ref 70–99)
Glucose-Capillary: 167 mg/dL — ABNORMAL HIGH (ref 70–99)

## 2020-04-06 SURGERY — AMPUTATION, TOE
Anesthesia: General | Site: Toe | Laterality: Left

## 2020-04-06 MED ORDER — FENTANYL CITRATE (PF) 100 MCG/2ML IJ SOLN
INTRAMUSCULAR | Status: AC
  Filled 2020-04-06: qty 2

## 2020-04-06 MED ORDER — PROPOFOL 10 MG/ML IV BOLUS
INTRAVENOUS | Status: DC | PRN
  Administered 2020-04-06: 25 mg via INTRAVENOUS

## 2020-04-06 MED ORDER — PROPOFOL 500 MG/50ML IV EMUL
INTRAVENOUS | Status: DC | PRN
  Administered 2020-04-06: 30 ug/kg/min via INTRAVENOUS

## 2020-04-06 MED ORDER — PROPOFOL 10 MG/ML IV BOLUS
INTRAVENOUS | Status: AC
  Filled 2020-04-06: qty 40

## 2020-04-06 MED ORDER — ONDANSETRON HCL 4 MG/2ML IJ SOLN
INTRAMUSCULAR | Status: AC
  Filled 2020-04-06: qty 2

## 2020-04-06 MED ORDER — ONDANSETRON HCL 4 MG/2ML IJ SOLN: 4.0000 mg | Freq: Once | INTRAMUSCULAR | Status: DC | PRN

## 2020-04-06 MED ORDER — LIDOCAINE HCL (PF) 1 % IJ SOLN
INTRAMUSCULAR | Status: DC | PRN
  Administered 2020-04-06: 5 mL

## 2020-04-06 MED ORDER — SODIUM CHLORIDE 0.9 % IV SOLN: INTRAVENOUS | Status: DC | PRN

## 2020-04-06 MED ORDER — PROPOFOL 10 MG/ML IV BOLUS
INTRAVENOUS | Status: AC
  Filled 2020-04-06: qty 20

## 2020-04-06 MED ORDER — FENTANYL CITRATE (PF) 100 MCG/2ML IJ SOLN
25.0000 ug | INTRAMUSCULAR | Status: DC | PRN
  Administered 2020-04-06: 25 ug via INTRAVENOUS

## 2020-04-06 MED ORDER — ROCURONIUM BROMIDE 10 MG/ML (PF) SYRINGE
PREFILLED_SYRINGE | INTRAVENOUS | Status: AC
  Filled 2020-04-06: qty 10

## 2020-04-06 MED ORDER — LIDOCAINE HCL (PF) 2 % IJ SOLN
INTRAMUSCULAR | Status: AC
  Filled 2020-04-06: qty 5

## 2020-04-06 MED ORDER — BUPIVACAINE HCL 0.5 % IJ SOLN
INTRAMUSCULAR | Status: DC | PRN
  Administered 2020-04-06: 10 mL

## 2020-04-06 MED ORDER — KETOROLAC TROMETHAMINE 30 MG/ML IJ SOLN
INTRAMUSCULAR | Status: AC
  Filled 2020-04-06: qty 1

## 2020-04-06 MED ORDER — DEXAMETHASONE SODIUM PHOSPHATE 10 MG/ML IJ SOLN
INTRAMUSCULAR | Status: AC
  Filled 2020-04-06: qty 1

## 2020-04-06 MED ORDER — ACETAMINOPHEN 10 MG/ML IV SOLN
INTRAVENOUS | Status: AC
  Filled 2020-04-06: qty 100

## 2020-04-06 SURGICAL SUPPLY — 45 items
BLADE OSC/SAGITTAL MD 5.5X18 (BLADE) ×1 IMPLANT
BLADE SURG MINI STRL (BLADE) ×2 IMPLANT
BNDG CONFORM 2 STRL LF (GAUZE/BANDAGES/DRESSINGS) ×2 IMPLANT
BNDG CONFORM 3 STRL LF (GAUZE/BANDAGES/DRESSINGS) ×4 IMPLANT
BNDG ELASTIC 4X5.8 VLCR NS LF (GAUZE/BANDAGES/DRESSINGS) ×2 IMPLANT
BNDG ESMARK 4X12 TAN STRL LF (GAUZE/BANDAGES/DRESSINGS) ×1 IMPLANT
BNDG GAUZE 4.5X4.1 6PLY STRL (MISCELLANEOUS) ×2 IMPLANT
CANISTER SUCT 1200ML W/VALVE (MISCELLANEOUS) ×2 IMPLANT
COVER WAND RF STERILE (DRAPES) ×2 IMPLANT
CUFF TOURN SGL QUICK 12 (TOURNIQUET CUFF) IMPLANT
CUFF TOURN SGL QUICK 18X4 (TOURNIQUET CUFF) IMPLANT
DRAPE FLUOR MINI C-ARM 54X84 (DRAPES) ×1 IMPLANT
DRAPE XRAY CASSETTE 23X24 (DRAPES) ×1 IMPLANT
DURAPREP 26ML APPLICATOR (WOUND CARE) ×2 IMPLANT
ELECT REM PT RETURN 9FT ADLT (ELECTROSURGICAL) ×2
ELECTRODE REM PT RTRN 9FT ADLT (ELECTROSURGICAL) ×1 IMPLANT
GAUZE PACKING IODOFORM 1/2 (PACKING) ×1 IMPLANT
GAUZE SPONGE 4X4 12PLY STRL (GAUZE/BANDAGES/DRESSINGS) ×2 IMPLANT
GAUZE XEROFORM 1X8 LF (GAUZE/BANDAGES/DRESSINGS) ×2 IMPLANT
GLOVE BIO SURGEON STRL SZ7.5 (GLOVE) ×5 IMPLANT
GLOVE INDICATOR 8.0 STRL GRN (GLOVE) ×2 IMPLANT
GOWN STRL REUS W/ TWL XL LVL3 (GOWN DISPOSABLE) ×2 IMPLANT
GOWN STRL REUS W/TWL XL LVL3 (GOWN DISPOSABLE) ×3
KIT TURNOVER KIT A (KITS) ×2 IMPLANT
LABEL OR SOLS (LABEL) ×2 IMPLANT
NDL FILTER BLUNT 18X1 1/2 (NEEDLE) ×1 IMPLANT
NDL HYPO 25X1 1.5 SAFETY (NEEDLE) ×1 IMPLANT
NEEDLE FILTER BLUNT 18X 1/2SAF (NEEDLE) ×1
NEEDLE FILTER BLUNT 18X1 1/2 (NEEDLE) ×1 IMPLANT
NEEDLE HYPO 25X1 1.5 SAFETY (NEEDLE) ×2 IMPLANT
NS IRRIG 500ML POUR BTL (IV SOLUTION) ×2 IMPLANT
PACK EXTREMITY (MISCELLANEOUS) ×2 IMPLANT
PAD ABD DERMACEA PRESS 5X9 (GAUZE/BANDAGES/DRESSINGS) ×2 IMPLANT
PULSAVAC PLUS IRRIG FAN TIP (DISPOSABLE)
SHIELD FULL FACE ANTIFOG 7M (MISCELLANEOUS) ×1 IMPLANT
SOL .9 NS 3000ML IRR  AL (IV SOLUTION)
SOL .9 NS 3000ML IRR UROMATIC (IV SOLUTION) ×1 IMPLANT
STOCKINETTE M/LG 89821 (MISCELLANEOUS) ×2 IMPLANT
STRAP SAFETY 5IN WIDE (MISCELLANEOUS) ×2 IMPLANT
SUT ETHILON 3-0 FS-10 30 BLK (SUTURE) ×2
SUT ETHILON 5-0 FS-2 18 BLK (SUTURE) ×2 IMPLANT
SUT VIC AB 4-0 FS2 27 (SUTURE) ×2 IMPLANT
SUTURE EHLN 3-0 FS-10 30 BLK (SUTURE) ×1 IMPLANT
SYR 10ML LL (SYRINGE) ×6 IMPLANT
TIP FAN IRRIG PULSAVAC PLUS (DISPOSABLE) ×1 IMPLANT

## 2020-04-06 NOTE — Progress Notes (Addendum)
PROGRESS NOTE    [name]  FKC:[id number] DOB: 16-Mar-1941 DOA: 03/30/2020 PCP: [name], MD    Brief Narrative:  [name] Roache Huntis a 79 y.o.femalewith medical history significant ofdiastolic congestive heart failure, diabetes mellitus, diabetic neuropathy, hypertension, hyperlipidemia, GERD, peripheral vascular disease, CVA presents to the ED at the request of her primary care physician for evaluation of a black necrotic left second toe with presumed cellulitis. The majority the history was gained by reading the medical record and speaking with the ED physician  Patient is admitted for necrotic left foot2ndtoe with superimposed cellulitis, Patient is started on IV antibiotics, Patient was seen by podiatry, Vascular surgery. Patient high risk for amputation based on clinical appearance.Patient underwent successful revascularization of the left lower extremity. Patient with onevessel runoff to the foot.Podiatry is planningamputation of gangrenous toe today 04/05/2020    Consultants:   Vascular surgery: Dr. Lucky Cowboy 03/31/2020  Wound care  Podiatry: Dr. Vickki Muff 03/30/2020  Procedures:   Chest x-ray 03/30/2020  Plain films of the left foot 03/30/2020  Lower extremity angiogram 04/03/2020 per Dr. Lucky Cowboy vascular surgery  Antimicrobials:   IV clindamycin 03/30/2020>>>> 03/31/2020  IV vancomycin 03/30/2020>>>>  IV Rocephin 03/31/2020>>>>>  IV Flagyl 03/31/2020 >>>>   Subjective: S/p OR today. She is requesting pain meds otherwise she has no other complaints  Objective: Vitals:   04/06/20 1339 04/06/20 1347 04/06/20 1422 04/06/20 1604  BP: (!) 163/49  (!) 155/58 (!) 164/69  Pulse: 65  69 71  Resp: 15  18 18   Temp:  (!) 97.5 F (36.4 C) 97.8 F (36.6 C) 97.8 F (36.6 C)  TempSrc:   Oral Oral  SpO2: 99%  100% 100%  Weight:      Height:        Intake/Output Summary (Last 24 hours) at 04/06/2020 1707 Last data filed at 04/06/2020 1511 Gross per 24 hour  Intake  100 ml  Output 855 ml  Net -755 ml   Filed Weights   03/30/20 1659 04/06/20 1108  Weight: 66.2 kg 66.2 kg    Examination:  General exam: Appears calm and comfortable  Respiratory system: Clear to auscultation. Respiratory effort normal. Cardiovascular system: S1 & S2 heard, RRR. No JVD, murmurs, rubs, gallops or clicks. No pedal edema. Gastrointestinal system: Abdomen is nondistended, soft and nontender. No organomegaly or masses felt. Normal bowel sounds heard. Central nervous system: Alert and oriented.  Grossly intact Extremities: Left second toe amputated no edema Skin: No rashes, lesions or ulcers Psychiatry: Judgement and insight appear normal. Mood & affect appropriate.     Data Reviewed: I have personally reviewed following labs and imaging studies  CBC: Recent Labs  Lab 04/02/20 0605 04/03/20 0425 04/04/20 0150 04/05/20 0430 04/06/20 0535  WBC 9.6 9.2 11.8* 10.3 11.7*  NEUTROABS  --   --   --   --  8.0*  HGB 10.3* 10.9* 10.3* 10.4* 11.9*  HCT 32.6* 33.8* 32.4* 31.8* 37.2  MCV 84.9 84.7 84.8 83.5 85.5  PLT 279 288 289 270 749   Basic Metabolic Panel: Recent Labs  Lab 04/01/20 0615 04/01/20 0615 04/02/20 0605 04/03/20 0425 04/04/20 0150 04/05/20 0430 04/06/20 0535  NA 138   < > 138 140 140 139 137  K 4.0   < > 3.3* 3.5 3.7 3.5 4.0  CL 104   < > 104 108 108 107 102  CO2 26   < > 23 27 22 24 27   GLUCOSE 162*   < > 147* 94 206* 178*  189*  BUN 13   < > 11 10 13 11 8   CREATININE 0.51   < > 0.53 0.44 0.61 0.64 0.51  CALCIUM 8.8*   < > 8.6* 8.8* 8.6* 8.7* 9.2  MG 1.9  --   --   --   --  2.1  --   PHOS 3.5  --   --   --   --  2.7  --    < > = values in this interval not displayed.   GFR: Estimated Creatinine Clearance: 53 mL/min (by C-G formula based on SCr of 0.51 mg/dL). Liver Function Tests: Recent Labs  Lab 03/31/20 0634 04/01/20 0615  AST 13* 14*  ALT 10 8  ALKPHOS 60 54  BILITOT 0.9 0.6  PROT 5.8* 5.7*  ALBUMIN 2.9* 2.8*   No results for  input(s): LIPASE, AMYLASE in the last 168 hours. No results for input(s): AMMONIA in the last 168 hours. Coagulation Profile: Recent Labs  Lab 03/30/20 2238 03/31/20 0634  INR 1.1 1.1   Cardiac Enzymes: No results for input(s): CKTOTAL, CKMB, CKMBINDEX, TROPONINI in the last 168 hours. BNP (last 3 results) No results for input(s): PROBNP in the last 8760 hours. HbA1C: No results for input(s): HGBA1C in the last 72 hours. CBG: Recent Labs  Lab 04/05/20 1833 04/05/20 2123 04/06/20 0827 04/06/20 1058 04/06/20 1338  GLUCAP 153* 114* 167* 144* 164*   Lipid Profile: No results for input(s): CHOL, HDL, LDLCALC, TRIG, CHOLHDL, LDLDIRECT in the last 72 hours. Thyroid Function Tests: No results for input(s): TSH, T4TOTAL, FREET4, T3FREE, THYROIDAB in the last 72 hours. Anemia Panel: No results for input(s): VITAMINB12, FOLATE, FERRITIN, TIBC, IRON, RETICCTPCT in the last 72 hours. Sepsis Labs: No results for input(s): PROCALCITON, LATICACIDVEN in the last 168 hours.  Recent Results (from the past 240 hour(s))  Culture, blood (Routine x 2)     Status: None   Collection Time: 03/30/20 10:58 AM   Specimen: BLOOD  Result Value Ref Range Status   Specimen Description BLOOD RIGHT HAND  Final   Special Requests   Final    BOTTLES DRAWN AEROBIC AND ANAEROBIC Blood Culture adequate volume   Culture   Final    NO GROWTH 5 DAYS Performed at [facility], [address], [place], [place] [address]    Report Status 04/04/2020 FINAL  Final  SARS Coronavirus 2 by RT PCR (hospital order, performed in [facility] hospital lab) Nasopharyngeal Nasopharyngeal Swab     Status: None   Collection Time: 03/30/20  6:07 PM   Specimen: Nasopharyngeal Swab  Result Value Ref Range Status   SARS Coronavirus 2 NEGATIVE NEGATIVE Final    Comment: (NOTE) SARS-CoV-2 target nucleic acids are NOT DETECTED.  The SARS-CoV-2 RNA is generally detectable in upper and lower respiratory specimens during  the acute phase of infection. The lowest concentration of SARS-CoV-2 viral copies this assay can detect is 250 copies / mL. A negative result does not preclude SARS-CoV-2 infection and should not be used as the sole basis for treatment or other patient management decisions.  A negative result may occur with improper specimen collection / handling, submission of specimen other than nasopharyngeal swab, presence of viral mutation(s) within the areas targeted by this assay, and inadequate number of viral copies (<250 copies / mL). A negative result must be combined with clinical observations, patient history, and epidemiological information.  Fact Sheet for Patients:   StrictlyIdeas.no  Fact Sheet for Healthcare Providers: BankingDealers.co.za  This test  is not yet approved or  cleared by the [place] and has been authorized for detection and/or diagnosis of SARS-CoV-2 by FDA under an Emergency Use Authorization (EUA).  This EUA will remain in effect (meaning this test can be used) for the duration of the COVID-19 declaration under Section 564(b)(1) of the Act, 21 U.S.C. section 360bbb-3(b)(1), unless the authorization is terminated or revoked sooner.  Performed at [facility], [address] [place], [place] [address]   MRSA PCR Screening     Status: None   Collection Time: 03/31/20  4:03 AM   Specimen: Nasopharyngeal  Result Value Ref Range Status   MRSA by PCR NEGATIVE NEGATIVE Final    Comment:        The GeneXpert MRSA Assay (FDA approved for NASAL specimens only), is one component of a comprehensive MRSA colonization surveillance program. It is not intended to diagnose MRSA infection nor to guide or monitor treatment for MRSA infections. Performed at [facility], [address], [place], [place] [address]          Radiology Studies: DG MINI C-ARM IMAGE ONLY  Result Date:  04/06/2020 There is no interpretation for this exam.  This order is for images obtained during a surgical procedure.  Please See "Surgeries" Tab for more information regarding the procedure.        Scheduled Meds: . amLODipine  10 mg Oral Daily  . aspirin EC  81 mg Oral Daily  . atorvastatin  20 mg Oral Daily  . budesonide  0.5 mg Nebulization BID  . carvedilol  6.25 mg Oral BID WC  . clopidogrel  75 mg Oral Q breakfast  . docusate sodium  100 mg Oral BID  . enoxaparin (LOVENOX) injection  40 mg Subcutaneous Q24H  . fentaNYL      . folic acid  1 mg Oral Daily  . gabapentin  300 mg Oral QPM  . hydrALAZINE  25 mg Oral Q8H  . insulin aspart  0-15 Units Subcutaneous TID WC  . insulin aspart  0-5 Units Subcutaneous QHS  . insulin detemir  10 Units Subcutaneous QHS  . isosorbide dinitrate  30 mg Oral Daily  . loratadine  10 mg Oral Daily  . magnesium oxide  400 mg Oral BID  . modafinil  200 mg Oral Daily  . pantoprazole  40 mg Oral Daily  . potassium chloride  40 mEq Oral Once  . torsemide  20 mg Oral BID   Continuous Infusions: . cefTRIAXone (ROCEPHIN)  IV 1 g (04/06/20 1444)  . metronidazole 500 mg (04/06/20 0513)  . vancomycin 500 mg (04/06/20 5277)    Assessment & Plan:   Active Problems:   HTN (hypertension)   DM2 (diabetes mellitus, type 2) (HCC)   PVD (peripheral vascular disease) ([place])   Infected wound   Necrotic toes (HCC)   Insulin dependent diabetes mellitus type IA ([place])   Diabetic neuropathy ([place])   1 necrotic left second toe with superimposed cellulitis Questionable etiology.  Patient with decreased sensation, timeframe unknown.  Plain films of the foot negative for osteomyelitis.  Patient has been seen by vascular surgery and podiatry.  Patient underwent lower extremity angiogram per vascular surgery with endovascular i intervention to left lower extremity/successful revascularization with one-vessel runoff to the foot.  Patient currently on aspirin, Plavix  and statin.  Coumadin on hold.   9/2-podiatry following.  Patient went to the OR this morning status post Amputation metatarsophalangeal joint left second toe We  will continue IV antibiotics for the next 24 to 48 hours postop per podiatry and vascular surgery  Follow-up with podiatry for further recommendation  2.  Chronic diastolic CHF Stable and euvolemic on exam.   We will continue home Coreg, isordil Outpatient f/u with cards  3.  Peripheral vascular disease/history of CVA Stable Continue aspirin and Plavix Continue statin therapy Vascular surgery following    4.  Hypertension Stable.  Continue Norvasc hydralazine and Coreg  5.  Insulin-dependent diabetes mellitus/diabetic neuropathy Was n.p.o. now she is post-surgery will resume her carb controlled diet riss Ck fs   DVT prophylaxis: Lovenox  code Status: Full Family Communication: None at bedside Status is: Inpatient  Remains inpatient appropriate because:IV treatments appropriate due to intensity of illness or inability to take PO   Dispo: The patient is from: Home              Anticipated d/c is to: home vs. snf              Anticipated d/c date is:TBD              Patient currently is not medically stable to d/c.Pt went to OR today for amputation.             LOS: 7 days   Time spent:35    Nolberto Hanlon, MD [facility] Pager [phone]  If 7PM-7AM, please contact night-coverage [email or web address] Password [facility] 04/06/2020, 5:07 PM

## 2020-04-06 NOTE — H&P (Signed)
HISTORY AND PHYSICAL INTERVAL NOTE:  04/06/2020  12:09 PM  [name]  has presented today for surgery, with the diagnosis of Osteomyelitis.  The various methods of treatment have been discussed with the patient.  No guarantees were given.  After consideration of risks, benefits and other options for treatment, the patient has consented to surgery.  I have reviewed the patients' chart and labs.     Patient had surgery scheduled yesterday and was canceled secondary to being fed lunch. Patient states she has been n.p.o. today and this has been confirmed with the nurses.  [name]

## 2020-04-06 NOTE — Transfer of Care (Signed)
Immediate Anesthesia Transfer of Care Note  Patient: [name]  Procedure(s) Performed: AMPUTATION LEFT SECOND TOE (Left Toe)  Patient Location: PACU  Anesthesia Type:MAC  Level of Consciousness: awake and alert   Airway & Oxygen Therapy: Patient Spontanous Breathing and Patient connected to nasal cannula oxygen  Post-op Assessment: Report given to RN and Post -op Vital signs reviewed and stable  Post vital signs: Reviewed and stable  Last Vitals:  Vitals Value Taken Time  BP    Temp    Pulse    Resp    SpO2      Last Pain:  Vitals:   04/06/20 1108  TempSrc: Temporal  PainSc: 7          Complications: No complications documented.

## 2020-04-06 NOTE — Op Note (Signed)
Operative note   Surgeon:Jinan Biggins Lawyer: None    Preop diagnosis: Gangrene left second toe    Postop diagnosis: Same    Procedure: Amputation metatarsophalangeal joint left second toe    EBL: Minimal    Anesthesia:local and IV sedation.  Local consisted of a 10 cc of a one-to-one mixture of 0.5% bupivacaine and 1% lidocaine.  Supplemented with additional 5 cc of half percent bupivacaine    Hemostasis: None    Specimen: Gangrenous left second toe    Complications: None    Operative indications:[name] is an 79 y.o. that presents today for surgical intervention.  The risks/benefits/alternatives/complications have been discussed and consent has been given.    Procedure:  Patient was brought into the OR and placed on the operating table in thesupine position. After anesthesia was obtained theleft lower extremity was prepped and draped in usual sterile fashion.  Attention was directed to the left second toe where 2 semielliptical incisions were made at the base of the second toe.  Full-thickness flaps were created.  The toe was then disarticulated at the metatarsophalangeal joint.  All bleeders were Bovie cauterized and the wound flushed with copious amounts of irrigation.  Closure was performed with a 4-0 Vicryl for the subcutaneous tissue and a 3-0 nylon for skin.  A bulky sterile dressing was applied to the left foot.    Patient tolerated the procedure and anesthesia well.  Was transported from the OR to the PACU with all vital signs stable and vascular status intact. To be discharged per routine protocol.  Will follow up in approximately 1 week in the outpatient clinic.

## 2020-04-06 NOTE — Anesthesia Postprocedure Evaluation (Signed)
Anesthesia Post Note  Patient: [name]  Procedure(s) Performed: AMPUTATION LEFT SECOND TOE (Left Toe)  Patient location during evaluation: PACU Anesthesia Type: General Level of consciousness: awake and alert Pain management: pain level controlled Vital Signs Assessment: post-procedure vital signs reviewed and stable Respiratory status: spontaneous breathing, nonlabored ventilation and respiratory function stable Cardiovascular status: blood pressure returned to baseline and stable Postop Assessment: no apparent nausea or vomiting Anesthetic complications: no   No complications documented.   Last Vitals:  Vitals:   04/06/20 1347 04/06/20 1422  BP:  (!) 155/58  Pulse:  69  Resp:  18  Temp: (!) 36.4 C   SpO2:  100%    Last Pain:  Vitals:   04/06/20 1347  TempSrc:   PainSc: [place]

## 2020-04-07 ENCOUNTER — Inpatient Hospital Stay: Payer: Self-pay

## 2020-04-07 ENCOUNTER — Encounter: Payer: Self-pay | Admitting: Podiatry

## 2020-04-07 DIAGNOSIS — E1142 Type 2 diabetes mellitus with diabetic polyneuropathy: Secondary | ICD-10-CM

## 2020-04-07 LAB — GLUCOSE, CAPILLARY
Glucose-Capillary: 172 mg/dL — ABNORMAL HIGH (ref 70–99)
Glucose-Capillary: 177 mg/dL — ABNORMAL HIGH (ref 70–99)
Glucose-Capillary: 194 mg/dL — ABNORMAL HIGH (ref 70–99)
Glucose-Capillary: 186 mg/dL — ABNORMAL HIGH (ref 70–99)

## 2020-04-07 MED ORDER — OXYCODONE-ACETAMINOPHEN 5-325 MG PO TABS: 1.0000 | ORAL_TABLET | Freq: Four times a day (QID) | ORAL | 0 refills | Status: DC | PRN

## 2020-04-07 MED ORDER — CEPHALEXIN 500 MG PO CAPS
500.0000 mg | ORAL_CAPSULE | Freq: Two times a day (BID) | ORAL | Status: DC
  Administered 2020-04-07 – 2020-04-08 (×2): 500 mg via ORAL
  Filled 2020-04-07 (×3): qty 1

## 2020-04-07 MED ORDER — BUDESONIDE 0.5 MG/2ML IN SUSP
0.5000 mg | Freq: Two times a day (BID) | RESPIRATORY_TRACT | Status: DC
  Administered 2020-04-07 – 2020-04-08 (×2): 0.5 mg via RESPIRATORY_TRACT
  Filled 2020-04-07 (×2): qty 2

## 2020-04-07 MED ORDER — CEPHALEXIN 500 MG PO CAPS: 500.0000 mg | ORAL_CAPSULE | Freq: Two times a day (BID) | ORAL | 0 refills | Status: DC

## 2020-04-07 NOTE — Progress Notes (Signed)
Daily Progress Note   Subjective  - 1 Day Post-Op  Patient status post left second toe amputation.  Some complaints of generalized diffuse lower extremity pain as well as back pain.  Overall though stable at this time.  Patient resting comfortably.  Patient seen with nurse in room.  Objective Vitals:   04/06/20 2056 04/07/20 0005 04/07/20 0510 04/07/20 0756  BP: (!) 154/59 (!) 124/48 (!) 153/53 133/77  Pulse:  81 79 73  Resp:  18 18 15   Temp:  98.2 F (36.8 C)  98.3 F (36.8 C)  TempSrc:  Oral  Oral  SpO2:  97%  99%  Weight:      Height:        Physical Exam: The incision is well coapted.  Dressing was changed.  Scant amount of drainage to the incision site noted.  No signs of purulence.  No surrounding necrotic tissue and skin flaps are well maintained.  Laboratory CBC    Component Value Date/Time   WBC 11.7 (H) 04/06/2020 0535   HGB 11.9 (L) 04/06/2020 0535   HCT 37.2 04/06/2020 0535   PLT 286 04/06/2020 0535    BMET    Component Value Date/Time   NA 137 04/06/2020 0535   K 4.0 04/06/2020 0535   CL 102 04/06/2020 0535   CO2 27 04/06/2020 0535   GLUCOSE 189 (H) 04/06/2020 0535   BUN 8 04/06/2020 0535   CREATININE 0.51 04/06/2020 0535   CALCIUM 9.2 04/06/2020 0535   GFRNONAA >60 04/06/2020 0535   GFRAA >60 04/06/2020 0535    Assessment/Planning: Status post amputation for gangrene left second toe   New dressing applied today.  This dressing can be left intact.  No dressing changes are needed.  Patient needs to follow-up with podiatry and 1 week upon discharge.  Will order physical therapy for assistance with transfer on heel.  Should long ambulation.  Transfer is okay at this time.  Maintain postoperative shoe while attempting ambulation.  No signs of infection intraoperatively.  No long-term antibiotics needed at this time.  Will follow closely outpatient though.  From podiatry standpoint patient is stable for discharge.  [name]  04/07/2020,  9:11 AM

## 2020-04-07 NOTE — Evaluation (Signed)
Physical Therapy Evaluation Patient Details Name: [name] MRN: [id number] DOB: April 03, 1941 Today's Date: 04/07/2020   History of Present Illness  Per MD notes: Pt is a 79 y.o. female with medical history significant of diastolic congestive heart failure, diabetes mellitus, diabetic neuropathy, hypertension, hyperlipidemia, GERD, peripheral vascular disease, CVA presents to the ED at the request of her primary care physician for evaluation of a black necrotic left second toe with presumed cellulitis.  Pt now s/p LLE revascularization and left 2nd toe metatarsophalangeal joint amputation.    Clinical Impression  Pt was pleasant and put forth good effort during the session but did require occasional encouragement to do so.  Pt presented with significant functional weakness and required extensive assist to come to sitting at the EOB.  Upon sitting up pt required occasional min to mod A to prevent posterior LOB but otherwise was able to maintain sitting position with close SBA and to participate in seated therex.  Pt will benefit from HHPT services upon discharge to safely address deficits listed in patient problem list for decreased caregiver assistance.     Follow Up Recommendations Home health PT;Supervision/Assistance - 24 hour    Equipment Recommendations  None recommended by PT    Recommendations for Other Services       Precautions / Restrictions Precautions Precautions: Fall Restrictions Weight Bearing Restrictions: Yes LLE Weight Bearing: Weight bearing as tolerated Other Position/Activity Restrictions: LLE WBAT through the heel with post-op shoe donned for transfers only      Mobility  Bed Mobility Overal bed mobility: Needs Assistance Bed Mobility: Supine to Sit;Sit to Supine;Rolling Rolling: Mod assist   Supine to sit: +2 for physical assistance;Max assist Sit to supine: +2 for physical assistance;Max assist   General bed mobility comments: Pt put forth good effort  but required heavy assist for both trunk and BLE control during sup to/from sit  Transfers                 General transfer comment: unable/unsafe to attempt  Ambulation/Gait                Stairs            Wheelchair Mobility    Modified Rankin (Stroke Patients Only)       Balance Overall balance assessment: Needs assistance   Sitting balance-Leahy Scale: Poor Sitting balance - Comments: Frequent min to mod A to prevent posterior LOB in sitting                                     Pertinent Vitals/Pain Pain Assessment: Faces Faces Pain Scale: Hurts little more Pain Location: Bottom Pain Descriptors / Indicators: Sore Pain Intervention(s): Premedicated before session;Monitored during session    Home Living Family/patient expects to be discharged to:: Private residence Living Arrangements: Spouse/significant other Available Help at Discharge: Family Type of Home: House Home Access: Ramped entrance     Home Layout: One level Home Equipment: Wheelchair - manual;Hospital bed;Other (comment) Additional Comments: Hoyer lift    Prior Function Level of Independence: Needs assistance   Gait / Transfers Assistance Needed: Per daughter via phone call pt progressively weaker over the last year and has been non-ambulatory, uses a hoyer lift for transfers to w/c  ADL's / Homemaking Assistance Needed: Pt requires assist for all ADLs        Hand Dominance        Extremity/Trunk  Assessment   Upper Extremity Assessment Upper Extremity Assessment: Generalized weakness    Lower Extremity Assessment Lower Extremity Assessment: Generalized weakness       Communication   Communication: No difficulties  Cognition Arousal/Alertness: Awake/alert Behavior During Therapy: WFL for tasks assessed/performed Overall Cognitive Status: No family/caregiver present to determine baseline cognitive functioning                                         General Comments      Exercises Total Joint Exercises Hip ABduction/ADduction: AAROM;Both;10 reps Straight Leg Raises: AAROM;Both;10 reps Long Arc Quad: AROM;Strengthening;Both;5 reps;10 reps Knee Flexion: AROM;Strengthening;5 reps;Both;10 reps Other Exercises Other Exercises: Rolling left/right with cues for sequencing and hand placement Other Exercises: Static/dynamic sitting at the EOB with anterior weight shifting activities   Assessment/Plan    PT Assessment Patient needs continued PT services  PT Problem List Decreased strength;Decreased activity tolerance;Decreased balance;Decreased mobility       PT Treatment Interventions DME instruction;Functional mobility training;Therapeutic activities;Therapeutic exercise;Balance training;Patient/family education    PT Goals (Current goals can be found in the Care Plan section)  Acute Rehab PT Goals Patient Stated Goal: To get stronger PT Goal Formulation: With patient Time For Goal Achievement: 04/20/20 Potential to Achieve Goals: Fair    Frequency Min 2X/week   Barriers to discharge        Co-evaluation               AM-PAC PT "6 Clicks" Mobility  Outcome Measure Help needed turning from your back to your side while in a flat bed without using bedrails?: Total Help needed moving from lying on your back to sitting on the side of a flat bed without using bedrails?: Total Help needed moving to and from a bed to a chair (including a wheelchair)?: Total Help needed standing up from a chair using your arms (e.g., wheelchair or bedside chair)?: Total Help needed to walk in hospital room?: Total Help needed climbing 3-5 steps with a railing? : Total 6 Click Score: 6    End of Session   Activity Tolerance: Patient tolerated treatment well Patient left: in bed;with call bell/phone within reach;with bed alarm set Nurse Communication: Mobility status;Other (comment) (Pt with c/o buttocks pain, left in  sidelying with recommendation for turning protocol and to get pt to chair with hoyer lift daily;) PT Visit Diagnosis: Muscle weakness (generalized) (M62.81)    Time: 7711-6579 PT Time Calculation (min) (ACUTE ONLY): 41 min   Charges:   PT Evaluation $PT Eval Moderate Complexity: 1 Mod PT Treatments $Therapeutic Exercise: 8-22 mins        D. Scott Vega [place] PT, DPT 04/07/20, 1:12 PM

## 2020-04-07 NOTE — TOC Progression Note (Signed)
Transition of Care [facility]) - Progression Note    Patient Details  Name: [name] MRN: [id number] Date of Birth: 10/13/1940  Transition of Care [facility] CM/SW [place], RN Phone Number: 04/07/2020, 2:51 PM  Clinical Narrative:   Damaris Schooner with Tommi Rumps at [name], He accepted the patient for RN, PT and Aide         Expected Discharge Plan and Services                                                 Social Determinants of Health (SDOH) Interventions    Readmission Risk Interventions Readmission Risk Prevention Plan 12/24/2018  [place] or Home Care Consult Complete  Some recent data might be hidden

## 2020-04-07 NOTE — Progress Notes (Signed)
In room to discharge patient earlier, patient stated "I don't want to go." This nurse inquired as to why patient didn't want to go and she stated "there is no one there to wait on me when I need something." Patient informed that was not a proper reason for staying in the hospital. Patient then reported pain in various areas of her body - buttocks, vaginal area, and foot. reporting that the vaginal pain has been present for "a long while", patient informed this was something she should see her PCP for, patient then stated "no, it just started today."  Charge nurse was notified of patients refusal to discharge. Patients spouse to desk and stated he wanted her to stay tonight because there was a law that she could. Social work re-educated patient and spouse about rights to appeal discharge and how to start appeal process. Patients spouse calling to start appeal at this time.

## 2020-04-07 NOTE — Care Management Important Message (Signed)
Important Message  Patient Details  Name: [name] MRN: [id number] Date of Birth: 1940/11/10   Medicare Important Message Given:  Yes  Spoke with husband via room phone due to patient's isolation status.  Concerned about pain level of patient and feels she would benefit for discharging tomorrow morning.  Discussed with patient and and decided to move forward with [place] appeal.     Dannette Barbara 04/07/2020, 5:29 PM

## 2020-04-07 NOTE — TOC Progression Note (Signed)
Transition of Care [facility]) - Progression Note    Patient Details  Name: [name] MRN: [id number] Date of Birth: [birth year]  Transition of Care [facility]) CM/SW Contact  Su Hilt, RN Phone Number: 04/07/2020, 1:34 PM  Clinical Narrative: Spoke with her Husband [name], I explained to him that she is refusing a PICC line, The patient  Lives at home with the family, she is bedbound at home and has been the last 1 year, has a hospital bed, hoyer lift and All DME equipment that they are needing, She is incontinent, He husband stated that they are trying to get help thru the CAP progrm, they have used [facility] in the past and would like to use another [facility] agency, I called [place] with [facility] and they can't go to [place] is not able to go to [employer], I called [place] [facility] they are not able to take due to not being INN, I called Tommi Rumps with [facility] to arrange [facility] PT and nursing and SW, Awaiting a call back         Expected Discharge Plan and Services                                                 Social Determinants of Health (SDOH) Interventions    Readmission Risk Interventions Readmission Risk Prevention Plan 12/24/2018  [place] or Home Care Consult Complete  Some recent data might be hidden

## 2020-04-07 NOTE — Progress Notes (Signed)
Arrived at bedside to obtain consent for PICC and place.  After explaining to patient why [facility] had arrived and what the PICC is, she stated "I don't want that".  Attempted to re-explain need with patient again stating "I don't want that".  Previously had been informed that patient was complaining of pain with PIV.  Explained to patient that she had been experiencing pain at current IV site and that PICC would take care of that issue.  Patient then stated "it doesn't hurt".  Butch Penny, RN bedside nurse made aware of above.  Butch Penny, RN also made aware that [facility] PICC nurse would not be able to return to campus later if patient changed her mind or if current PIV stopped working due limited services today.  Butch Penny stated that she understood.  Order to be canceled.

## 2020-04-07 NOTE — Discharge Summary (Addendum)
[name]:[id number] DOB: March 30, 1941 DOA: 03/30/2020  PCP: [name], MD  Admit date: 03/30/2020 Discharge date: 04/08/2020  Admitted From: Home Disposition: Home  Recommendations for Outpatient Follow-up:  1. Follow up with PCP in 1 week 2. Please obtain BMP/CBC in one week 3. Follow-up with Dr. Vickki Muff podiatry in 1 week 4. Follow-up with Dr. Lucky Cowboy in 1 month with ABI  Home Health: Yes   Discharge Condition:Stable CODE STATUS: Full Diet recommendation: Carb modified    Brief/Interim Summary: [name] is a 79 y.o. female with medical history significant of diastolic congestive heart failure, diabetes mellitus, diabetic neuropathy, hypertension, hyperlipidemia, GERD, peripheral vascular disease, CVA presents to the ED at the request of her primary care physician for evaluation of a black necrotic left second toe with presumed cellulitis.     9/4: Patient was supposed to be discharged yesterday however she did not want to leave and had appealed her discharge.  Her husband is at bedside today and states they like to take her home today.  He has requested her medications to be sent to the new pharmacy since her the other medications that was already sent the pharmacy is closed until Monday.   1 necrotic left second toe with superimposed cellulitis Questionable etiology. Patient with decreased sensation, timeframe unknown. Plain films of the foot negative for osteomyelitis. Patient has been seen by vascular surgery and podiatry. Patient underwent lower extremity angiogram per vascular surgery with endovascular i intervention to left lower extremity/successful revascularization with one-vessel runoff to the foot. Patient currently on aspirin, Plavix and statin.  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  Will follow closely outpatient though  [facility] for dc from  vascular and podiatry stand point   2. Chronic diastolic CHF Euvolemic and stable.  Continue outpt meds Outpatient f/u with cards  3. Peripheral vascular disease/history of CVA Stable Continue aspirin and Plavix, and statin F/u vascular in one month with ABI   4. Hypertension Stable.   Continue Norvasc hydralazine and Coreg  5. Insulin-dependent diabetes mellitus/diabetic neuropathy Resume home meds  Discharge Diagnoses:  Active Problems:   HTN (hypertension)   DM2 (diabetes mellitus, type 2) (HCC)   PVD (peripheral vascular disease) (HCC)   Infected wound   Necrotic toes (HCC)   Insulin dependent diabetes mellitus type IA ([name])   Diabetic neuropathy [facility])    Discharge Instructions  Discharge Instructions    Call MD for:  severe uncontrolled pain   Complete by: As directed    Call MD for:  temperature >100.4   Complete by: As directed    Diet - low sodium heart healthy   Complete by: As directed    Diet Carb Modified   Complete by: As directed    Discharge instructions   Complete by: As directed    Follow-up with Dr. Lucky Cowboy vascular surgery in 1 month Follow-up with Dr. Vickki Muff podiatry in 1 week Leave dressings on until you see Dr. Vickki Muff Follow-up with PCP in 1 week   Increase activity slowly   Complete by: As directed    Leave dressing on - Keep it clean, dry, and intact until clinic visit   Complete by: As directed      Allergies as of 04/08/2020      Reactions   Iodine Anaphylaxis   Penicillins Anaphylaxis   Tolerates ceftriaxone, cefazolin Did it involve swelling of the face/tongue/throat, SOB, or  low BP? Unknown Did it involve sudden or severe rash/hives, skin peeling, or any reaction on the inside of your mouth or nose? Unknown Did you need to seek medical attention at a hospital or doctor's office? Unknown When did it last happen?Unknown If all above answers are "NO", may proceed with cephalosporin use.   Shellfish Allergy Anaphylaxis    Sulfa Antibiotics Anaphylaxis   Sulfacetamide Sodium Anaphylaxis   Sulfasalazine Anaphylaxis   Eggs Or Egg-derived Products    Other reaction(s): SHORTNESS OF BREATH   Morphine And Related Other (See Comments)   Altered mental status      Medication List    TAKE these medications   acetaminophen 500 MG tablet Commonly known as: TYLENOL Take 1,000 mg by mouth every 6 (six) hours as needed for mild pain.   amLODipine 10 MG tablet Commonly known as: NORVASC Take 10 mg by mouth daily.   aspirin 81 MG EC tablet Take 1 tablet (81 mg total) by mouth daily.   atorvastatin 20 MG tablet Commonly known as: LIPITOR Take 1 tablet (20 mg total) by mouth daily. What changed: when to take this   budesonide 0.25 MG/2ML nebulizer solution Commonly known as: PULMICORT Take 2 mLs (0.25 mg total) by nebulization 2 (two) times daily.   carvedilol 6.25 MG tablet Commonly known as: COREG Take 6.25 mg by mouth 2 (two) times daily with a meal.   cephALEXin 500 MG capsule Commonly known as: KEFLEX Take 1 capsule (500 mg total) by mouth 2 (two) times daily for 5 days.   cetirizine 10 MG tablet Commonly known as: ZYRTEC Take 10 mg by mouth daily.   clopidogrel 75 MG tablet Commonly known as: PLAVIX Take 1 tablet (75 mg total) by mouth daily with breakfast.   docusate sodium 100 MG capsule Commonly known as: COLACE Take 100 mg by mouth 2 (two) times daily.   fluticasone 50 MCG/ACT nasal spray Commonly known as: FLONASE Place 1 spray into both nostrils daily as needed for allergies.   gabapentin 300 MG capsule Commonly known as: NEURONTIN Take 300 mg by mouth every evening.   hydrALAZINE 25 MG tablet Commonly known as: APRESOLINE Take 1 tablet (25 mg total) by mouth every 8 (eight) hours. What changed: when to take this   insulin glargine 100 UNIT/ML injection Commonly known as: LANTUS Inject 0.15 mLs (15 Units total) into the skin at bedtime. What changed: how much to take    magnesium oxide 400 MG tablet Commonly known as: MAG-OX Take 400 mg by mouth 2 (two) times daily.   modafinil 200 MG tablet Commonly known as: PROVIGIL Take 200 mg by mouth daily.   omeprazole 20 MG capsule Commonly known as: PRILOSEC Take 1 capsule (20 mg total) by mouth daily.   oxyCODONE-acetaminophen 5-325 MG tablet Commonly known as: PERCOCET/ROXICET Take 1 tablet by mouth every 6 (six) hours as needed for moderate pain or severe pain.   polyethylene glycol 17 g packet Commonly known as: MIRALAX / GLYCOLAX Take 17 g by mouth 2 (two) times daily. What changed:   when to take this  reasons to take this Notes to patient: Not given this hospital stay   potassium chloride SA 20 MEQ tablet Commonly known as: KLOR-CON Take 20 mEq by mouth daily.   torsemide 20 MG tablet Commonly known as: DEMADEX Take 1 tablet (20 mg total) by mouth 2 (two) times daily.   Vitamin D 50 MCG (2000 UT) Caps Take 2,000 Units by mouth daily.  Vitamin E 180 MG Caps Take 180 mg by mouth daily.            Discharge Care Instructions  (From admission, onward)         Start     Ordered   04/07/20 0000  Leave dressing on - Keep it clean, dry, and intact until clinic visit        04/07/20 1514          Follow-up Information    Dew, Erskine Squibb, MD In 1 month.   Specialties: Vascular Surgery, Radiology, Interventional Cardiology Why: Can see Dew or Arna Medici. Will need ABI with visit; Patient to call for Appt Contact information: [place] [address] [phone]        Samara Deist, DPM In 1 week.   Specialty: Podiatry Why: Patient to call on Tues; Sept 7th to Schedule and Appt/Time Contact information: [place] [address] [phone]        [name], MD In 1 week.   Specialty: Family Medicine Why: Patient to call for Appt Contact information: [place] [address] [phone]               Allergies  Allergen Reactions  . Iodine Anaphylaxis  . Penicillins Anaphylaxis    Tolerates ceftriaxone, cefazolin Did it involve swelling of the face/tongue/throat, SOB, or low BP? Unknown Did it involve sudden or severe rash/hives, skin peeling, or any reaction on the inside of your mouth or nose? Unknown Did you need to seek medical attention at a hospital or doctor's office? Unknown When did it last happen?Unknown If all above answers are "NO", may proceed with cephalosporin use.   . Shellfish Allergy Anaphylaxis  . Sulfa Antibiotics Anaphylaxis  . Sulfacetamide Sodium Anaphylaxis  . Sulfasalazine Anaphylaxis  . Eggs Or Egg-Derived Products     Other reaction(s): SHORTNESS OF BREATH  . Morphine And Related Other (See Comments)    Altered mental status    Consultations:    Vascular surgery: Dr.Dew8/27/2021  Wound care  Podiatry: Dr. Fowler8/26/2021  Procedures/Studies: DG Chest 1 View  Result Date: 03/30/2020 CLINICAL DATA:  Sepsis, toe infection EXAM: CHEST  1 VIEW COMPARISON:  12/23/2018 FINDINGS: Stable cardiomegaly. Chronic bilateral interstitial prominence. No focal airspace consolidation, pleural effusion, or pneumothorax. Bones appear demineralized. IMPRESSION: Cardiomegaly with chronic bilateral interstitial prominence. No focal airspace consolidation. Electronically Signed   By: Davina Poke D.O.   On: 03/30/2020 11:38   PERIPHERAL VASCULAR CATHETERIZATION  Result Date: 04/03/2020 See op note  DG Foot Complete Left  Result Date: 03/30/2020 CLINICAL DATA:  Sepsis.  Second toe infection. EXAM: LEFT FOOT - COMPLETE 3+ VIEW COMPARISON:  None. FINDINGS: Diffuse osteopenia is noted. 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 Vascular calcifications are noted. No fracture or dislocation is noted. No definite lytic destruction is seen to suggest acute osteomyelitis. IMPRESSION: No definite lytic destruction is seen to suggest acute  osteomyelitis. Electronically Signed   By: Marijo Conception M.D.   On: 03/30/2020 11:41   DG MINI C-ARM IMAGE ONLY  Result Date: 04/06/2020 There is no interpretation for this exam.  This order is for images obtained during a surgical procedure.  Please See "Surgeries" Tab for more information regarding the procedure.   [place] EKG SITE RITE  Result Date: 04/07/2020 If Site Rite image not attached, placement could not be confirmed due to current cardiac rhythm.     Subjective: Patient has  no new complaints.  Has pain from the surgery otherwise no other complaints  Discharge Exam: Vitals:   04/08/20 0533 04/08/20 0810  BP: (!) 142/52   Pulse: 77   Resp: 18   Temp:    SpO2:  96%   Vitals:   04/07/20 2121 04/08/20 0106 04/08/20 0533 04/08/20 0810  BP: (!) 134/48 (!) 119/47 (!) 142/52   Pulse: 73 75 77   Resp: 18 18 18    Temp: 98.1 F (36.7 C) 97.9 F (36.6 C)    TempSrc: Oral Oral    SpO2: 97% 95%  96%  Weight:      Height:        General: Pt is alert, awake, not in acute distress Cardiovascular: RRR, S1/S2 +, no rubs, no gallops Respiratory: CTA bilaterally, no wheezing, no rhonchi Abdominal: Soft, NT, ND, bowel sounds + Extremities: Left foot dressing on. RLE no edema    The results of significant diagnostics from this hospitalization (including imaging, microbiology, ancillary and laboratory) are listed below for reference.     Microbiology: Recent Results (from the past 240 hour(s))  Culture, blood (Routine x 2)     Status: None   Collection Time: 03/30/20 10:58 AM   Specimen: BLOOD  Result Value Ref Range Status   Specimen Description BLOOD RIGHT HAND  Final   Special Requests   Final    BOTTLES DRAWN AEROBIC AND ANAEROBIC Blood Culture adequate volume   Culture   Final    NO GROWTH 5 DAYS Performed at [facility], [address] [place], [place] [address]    Report Status 04/04/2020 FINAL  Final  SARS Coronavirus 2 by RT PCR (hospital order,  performed in [place] hospital lab) Nasopharyngeal Nasopharyngeal Swab     Status: None   Collection Time: 03/30/20  6:07 PM   Specimen: Nasopharyngeal Swab  Result Value Ref Range Status   SARS Coronavirus 2 NEGATIVE NEGATIVE Final    Comment: (NOTE) SARS-CoV-2 target nucleic acids are NOT DETECTED.  The SARS-CoV-2 RNA is generally detectable in upper and lower respiratory specimens during the acute phase of infection. The lowest concentration of SARS-CoV-2 viral copies this assay can detect is 250 copies / mL. A negative result does not preclude SARS-CoV-2 infection and should not be used as the sole basis for treatment or other patient management decisions.  A negative result may occur with improper specimen collection / handling, submission of specimen other than nasopharyngeal swab, presence of viral mutation(s) within the areas targeted by this assay, and inadequate number of viral copies (<250 copies / mL). A negative result must be combined with clinical observations, patient history, and epidemiological information.  Fact Sheet for Patients:   StrictlyIdeas.no  Fact Sheet for Healthcare Providers: BankingDealers.co.za  This test is not yet approved or  cleared by the [place] FDA and has been authorized for detection and/or diagnosis of SARS-CoV-2 by FDA under an Emergency Use Authorization (EUA).  This EUA will remain in effect (meaning this test can be used) for the duration of the COVID-19 declaration under Section 564(b)(1) of the Act, 21 U.S.C. section 360bbb-3(b)(1), unless the authorization is terminated or revoked sooner.  Performed at [facility], [address] [place], [place] [address]   MRSA PCR Screening     Status: None   Collection Time: 03/31/20  4:03 AM   Specimen: Nasopharyngeal  Result Value Ref Range Status   MRSA by PCR NEGATIVE NEGATIVE Final    Comment:  The GeneXpert  MRSA Assay (FDA approved for NASAL specimens only), is one component of a comprehensive MRSA colonization surveillance program. It is not intended to diagnose MRSA infection nor to guide or monitor treatment for MRSA infections. Performed at [facility], [address] [place], [place] [address]      Labs: BNP (last 3 results) Recent Labs    04/01/20 0615  BNP 371.0*   Basic Metabolic Panel: Recent Labs  Lab 04/02/20 0605 04/03/20 0425 04/04/20 0150 04/05/20 0430 04/06/20 0535  NA 138 140 140 139 137  K 3.3* 3.5 3.7 3.5 4.0  CL 104 108 108 107 102  CO2 23 27 22 24 27   GLUCOSE 147* 94 206* 178* 189*  BUN 11 10 13 11 8   CREATININE 0.53 0.44 0.61 0.64 0.51  CALCIUM 8.6* 8.8* 8.6* 8.7* 9.2  MG  --   --   --  2.1  --   PHOS  --   --   --  2.7  --    Liver Function Tests: No results for input(s): AST, ALT, ALKPHOS, BILITOT, PROT, ALBUMIN in the last 168 hours. No results for input(s): LIPASE, AMYLASE in the last 168 hours. No results for input(s): AMMONIA in the last 168 hours. CBC: Recent Labs  Lab 04/02/20 0605 04/03/20 0425 04/04/20 0150 04/05/20 0430 04/06/20 0535  WBC 9.6 9.2 11.8* 10.3 11.7*  NEUTROABS  --   --   --   --  8.0*  HGB 10.3* 10.9* 10.3* 10.4* 11.9*  HCT 32.6* 33.8* 32.4* 31.8* 37.2  MCV 84.9 84.7 84.8 83.5 85.5  PLT 279 288 289 270 286   Cardiac Enzymes: No results for input(s): CKTOTAL, CKMB, CKMBINDEX, TROPONINI in the last 168 hours. BNP: Invalid input(s): POCBNP CBG: Recent Labs  Lab 04/07/20 1148 04/07/20 1727 04/07/20 2115 04/08/20 0802 04/08/20 1138  GLUCAP 177* 194* 186* 137* 129*   D-Dimer No results for input(s): DDIMER in the last 72 hours. Hgb A1c No results for input(s): HGBA1C in the last 72 hours. Lipid Profile No results for input(s): CHOL, HDL, LDLCALC, TRIG, CHOLHDL, LDLDIRECT in the last 72 hours. Thyroid function studies No results for input(s): TSH, T4TOTAL, T3FREE, THYROIDAB in the last 72  hours.  Invalid input(s): FREET3 Anemia work up No results for input(s): VITAMINB12, FOLATE, FERRITIN, TIBC, IRON, RETICCTPCT in the last 72 hours. Urinalysis    Component Value Date/Time   COLORURINE YELLOW (A) 03/31/2020 0238   APPEARANCEUR CLOUDY (A) 03/31/2020 0238   LABSPEC 1.007 03/31/2020 0238   PHURINE 9.0 (H) 03/31/2020 0238   GLUCOSEU >=500 (A) 03/31/2020 0238   HGBUR NEGATIVE 03/31/2020 0238   BILIRUBINUR NEGATIVE 03/31/2020 0238   KETONESUR NEGATIVE 03/31/2020 0238   PROTEINUR NEGATIVE 03/31/2020 0238   UROBILINOGEN 0.2 02/12/2014 0110   NITRITE NEGATIVE 03/31/2020 0238   LEUKOCYTESUR LARGE (A) 03/31/2020 0238   Sepsis Labs Invalid input(s): PROCALCITONIN,  WBC,  LACTICIDVEN Microbiology Recent Results (from the past 240 hour(s))  Culture, blood (Routine x 2)     Status: None   Collection Time: 03/30/20 10:58 AM   Specimen: BLOOD  Result Value Ref Range Status   Specimen Description BLOOD RIGHT HAND  Final   Special Requests   Final    BOTTLES DRAWN AEROBIC AND ANAEROBIC Blood Culture adequate volume   Culture   Final    NO GROWTH 5 DAYS Performed at [facility], [address] [place], [place] [address]    Report Status 04/04/2020 FINAL  Final  SARS Coronavirus 2 by RT PCR (hospital order, performed in [facility] hospital lab) Nasopharyngeal Nasopharyngeal Swab     Status: None   Collection Time: 03/30/20  6:07 PM   Specimen: Nasopharyngeal Swab  Result Value Ref Range Status   SARS Coronavirus 2 NEGATIVE NEGATIVE Final    Comment: (NOTE) SARS-CoV-2 target nucleic acids are NOT DETECTED.  The SARS-CoV-2 RNA is generally detectable in upper and lower respiratory specimens during the acute phase of infection. The lowest concentration of SARS-CoV-2 viral copies this assay can detect is 250 copies / mL. A negative result does not preclude SARS-CoV-2 infection and should not be used as the sole basis for treatment or other patient management  decisions.  A negative result may occur with improper specimen collection / handling, submission of specimen other than nasopharyngeal swab, presence of viral mutation(s) within the areas targeted by this assay, and inadequate number of viral copies (<250 copies / mL). A negative result must be combined with clinical observations, patient history, and epidemiological information.  Fact Sheet for Patients:   StrictlyIdeas.no  Fact Sheet for Healthcare Providers: BankingDealers.co.za  This test is not yet approved or  cleared by the [place] FDA and has been authorized for detection and/or diagnosis of SARS-CoV-2 by FDA under an Emergency Use Authorization (EUA).  This EUA will remain in effect (meaning this test can be used) for the duration of the COVID-19 declaration under Section 564(b)(1) of the Act, 21 U.S.C. section 360bbb-3(b)(1), unless the authorization is terminated or revoked sooner.  Performed at [facility], [address] [place], [place] [address]   MRSA PCR Screening     Status: None   Collection Time: 03/31/20  4:03 AM   Specimen: Nasopharyngeal  Result Value Ref Range Status   MRSA by PCR NEGATIVE NEGATIVE Final    Comment:        The GeneXpert MRSA Assay (FDA approved for NASAL specimens only), is one component of a comprehensive MRSA colonization surveillance program. It is not intended to diagnose MRSA infection nor to guide or monitor treatment for MRSA infections. Performed at [facility], [address] [place],  [address]      Time coordinating discharge: Over 30 minutes  SIGNED:   Nolberto Hanlon, MD  [facility] 04/08/2020, 12:21 PM Pager   If 7PM-7AM, please contact night-coverage [email or web address] Password [email or web address]

## 2020-04-07 NOTE — TOC Progression Note (Signed)
Transition of Care [facility]) - Progression Note    Patient Details  Name: GAYLIA KASSEL MRN: [id number] Date of Birth: 1941-05-25  Transition of Care [facility]) CM/SW [place], RN Phone Number: 04/07/2020, 3:34 PM  Clinical Narrative:   EMS DC packet on the chart to transport home, The bedside nurse is aware        Expected Discharge Plan and Services           Expected Discharge Date: 04/07/20                                     Social Determinants of Health (SDOH) Interventions    Readmission Risk Interventions Readmission Risk Prevention Plan 12/24/2018  [place] or Home Care Consult Complete  Some recent data might be hidden

## 2020-04-08 LAB — GLUCOSE, CAPILLARY
Glucose-Capillary: 137 mg/dL — ABNORMAL HIGH (ref 70–99)
Glucose-Capillary: 129 mg/dL — ABNORMAL HIGH (ref 70–99)

## 2020-04-08 MED ORDER — CEPHALEXIN 500 MG PO CAPS: 500.0000 mg | ORAL_CAPSULE | Freq: Two times a day (BID) | ORAL | 0 refills | Status: AC

## 2020-04-08 MED ORDER — OXYCODONE-ACETAMINOPHEN 5-325 MG PO TABS
1.0000 | ORAL_TABLET | Freq: Four times a day (QID) | ORAL | 0 refills | Status: DC | PRN
Start: 2020-04-08 — End: 2020-10-23

## 2020-04-08 NOTE — TOC Progression Note (Signed)
Transition of Care [facility]) - Progression Note    Patient Details  Name: [name] MRN: [id number] Date of Birth: February 15, 1941  Transition of Care [facility]) CM/SW [place] Phone Number: 04/08/2020, 1:47 PM  Clinical Narrative:     Patient and husband now in agreement with discharge.  Instructed husband to call [employer] at [phone] to cancel Judithann Graves appeal.  Relayed the case ID of [id number] to him in the event it was needed when he called.    Left copy of [employer] brochure with husband on Friday, instructed him to reach out to their contact numbers listed if they had not heard from anyone after a few days to schedule a time for a home visit.         Barriers to Discharge: No Barriers Identified  Expected Discharge Plan and Services           Expected Discharge Date: 04/07/20                         HH Arranged: PT, OT, RN [place] Agency: [place] Date [facility] Agency Contacted: 04/07/20 Time [place]: [phone] Representative spoke with at [place]: [place]) Interventions    Readmission Risk Interventions Readmission Risk Prevention Plan 12/24/2018  [place] or Home Care Consult Complete  Some recent data might be hidden

## 2020-04-08 NOTE — TOC Transition Note (Addendum)
Transition of Care [facility]) - CM/SW Discharge Note   Patient Details  Name: [name] MRN: [id number] Date of Birth: [birth year]  Transition of Care [facility]) CM/SW Contact:  Boris Sharper, LCSW Phone Number: 04/08/2020, 12:37 PM   Clinical Narrative:    Pt medically stable for discharge, Pt will be transported home via EMS. Alvis Lemmings has agreed to follow pt for [facility] PT, OT, and RN.Transport arranged with [facility] will be here about 3:30pm   Final next level of care: [place] Barriers to Discharge: No Barriers Identified   Patient Goals and CMS Choice        Discharge Placement                Patient to be transferred to facility by: EMS Name of family member notified: [name] Patient and family notified of of transfer: 04/08/20  Discharge Plan and Services                          HH Arranged: PT, OT, RN [facility] Agency: [place] Date [facility] Agency Contacted: 04/07/20 Time [place]: [phone] Representative spoke with at [place]: [place]) Interventions     Readmission Risk Interventions Readmission Risk Prevention Plan 12/24/2018  [place] or Home Care Consult Complete  Some recent data might be hidden

## 2020-04-11 LAB — SURGICAL PATHOLOGY

## 2020-05-18 ENCOUNTER — Other Ambulatory Visit (INDEPENDENT_AMBULATORY_CARE_PROVIDER_SITE_OTHER): Payer: Self-pay | Admitting: Vascular Surgery

## 2020-05-18 DIAGNOSIS — Z9862 Peripheral vascular angioplasty status: Secondary | ICD-10-CM

## 2020-05-21 IMAGING — DX DG CHEST 1V PORT
1 series · 1 of 1 positions shown · non-contrast
Comparison: 06/03/2018 and older exams.

CLINICAL DATA: Acute respiratory failure.  Follow-up exam.

EXAM:
PORTABLE CHEST 1 VIEW

[chest ap]
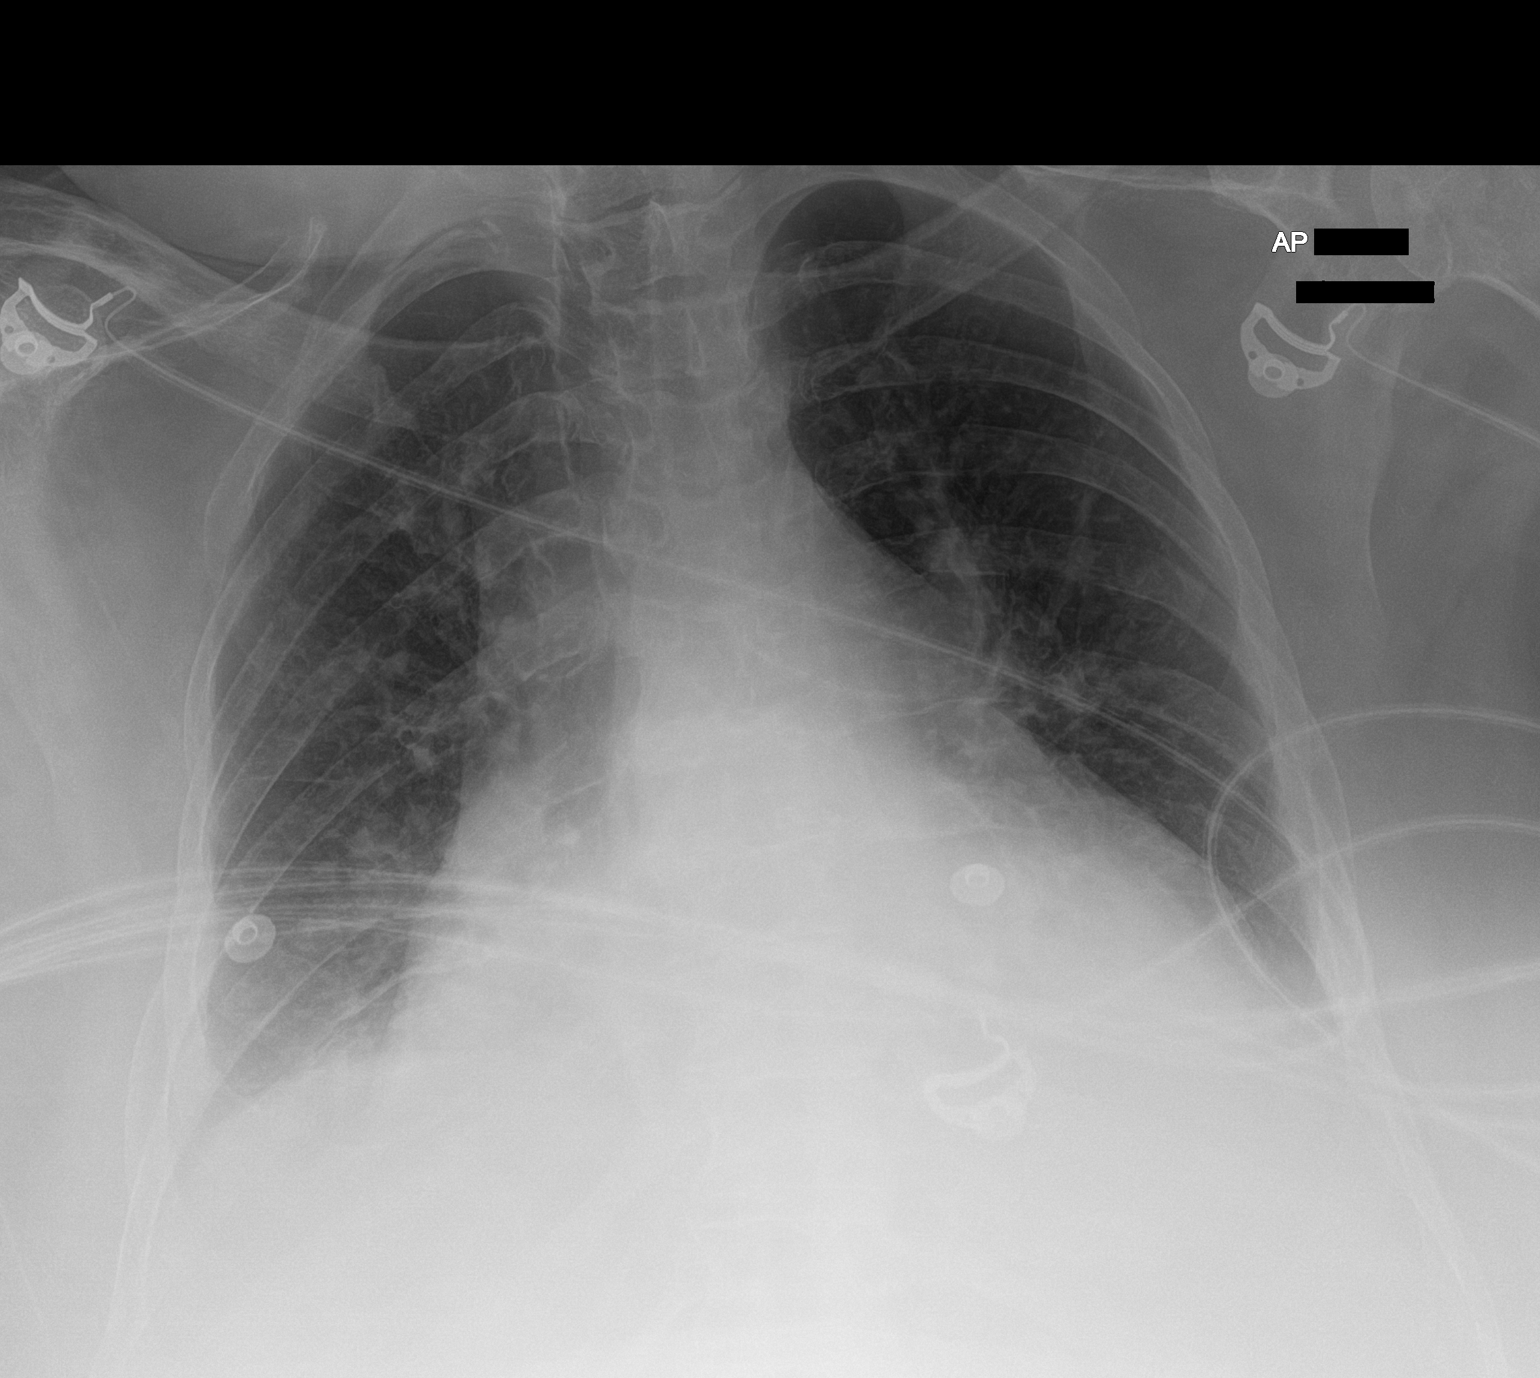

[1 of 1 positions shown; findings below may reference images not displayed]

FINDINGS: Stable enlargement of the cardiopericardial silhouette. Basilar
opacity has improved compared to the previous day's study consistent
with a reduction in sizes of pleural effusions and associated
atelectasis. Remainder of the lungs is clear.

No pneumothorax.
IMPRESSION: 1. Improved lung aeration with decreased lung base atelectasis and
smaller pleural effusions.
2. No new abnormalities.  No current evidence of pulmonary edema.

## 2020-05-22 ENCOUNTER — Ambulatory Visit (INDEPENDENT_AMBULATORY_CARE_PROVIDER_SITE_OTHER): Payer: Medicare HMO | Admitting: Vascular Surgery

## 2020-05-22 ENCOUNTER — Encounter (INDEPENDENT_AMBULATORY_CARE_PROVIDER_SITE_OTHER): Payer: Medicare HMO

## 2020-05-25 IMAGING — CR DG CHEST 2V
2 series · 2 of 2 positions shown · non-contrast
Comparison: 06/05/2018 chest radiograph.

CLINICAL DATA: Dyspnea

EXAM:
CHEST - 2 VIEW

[chest lat]
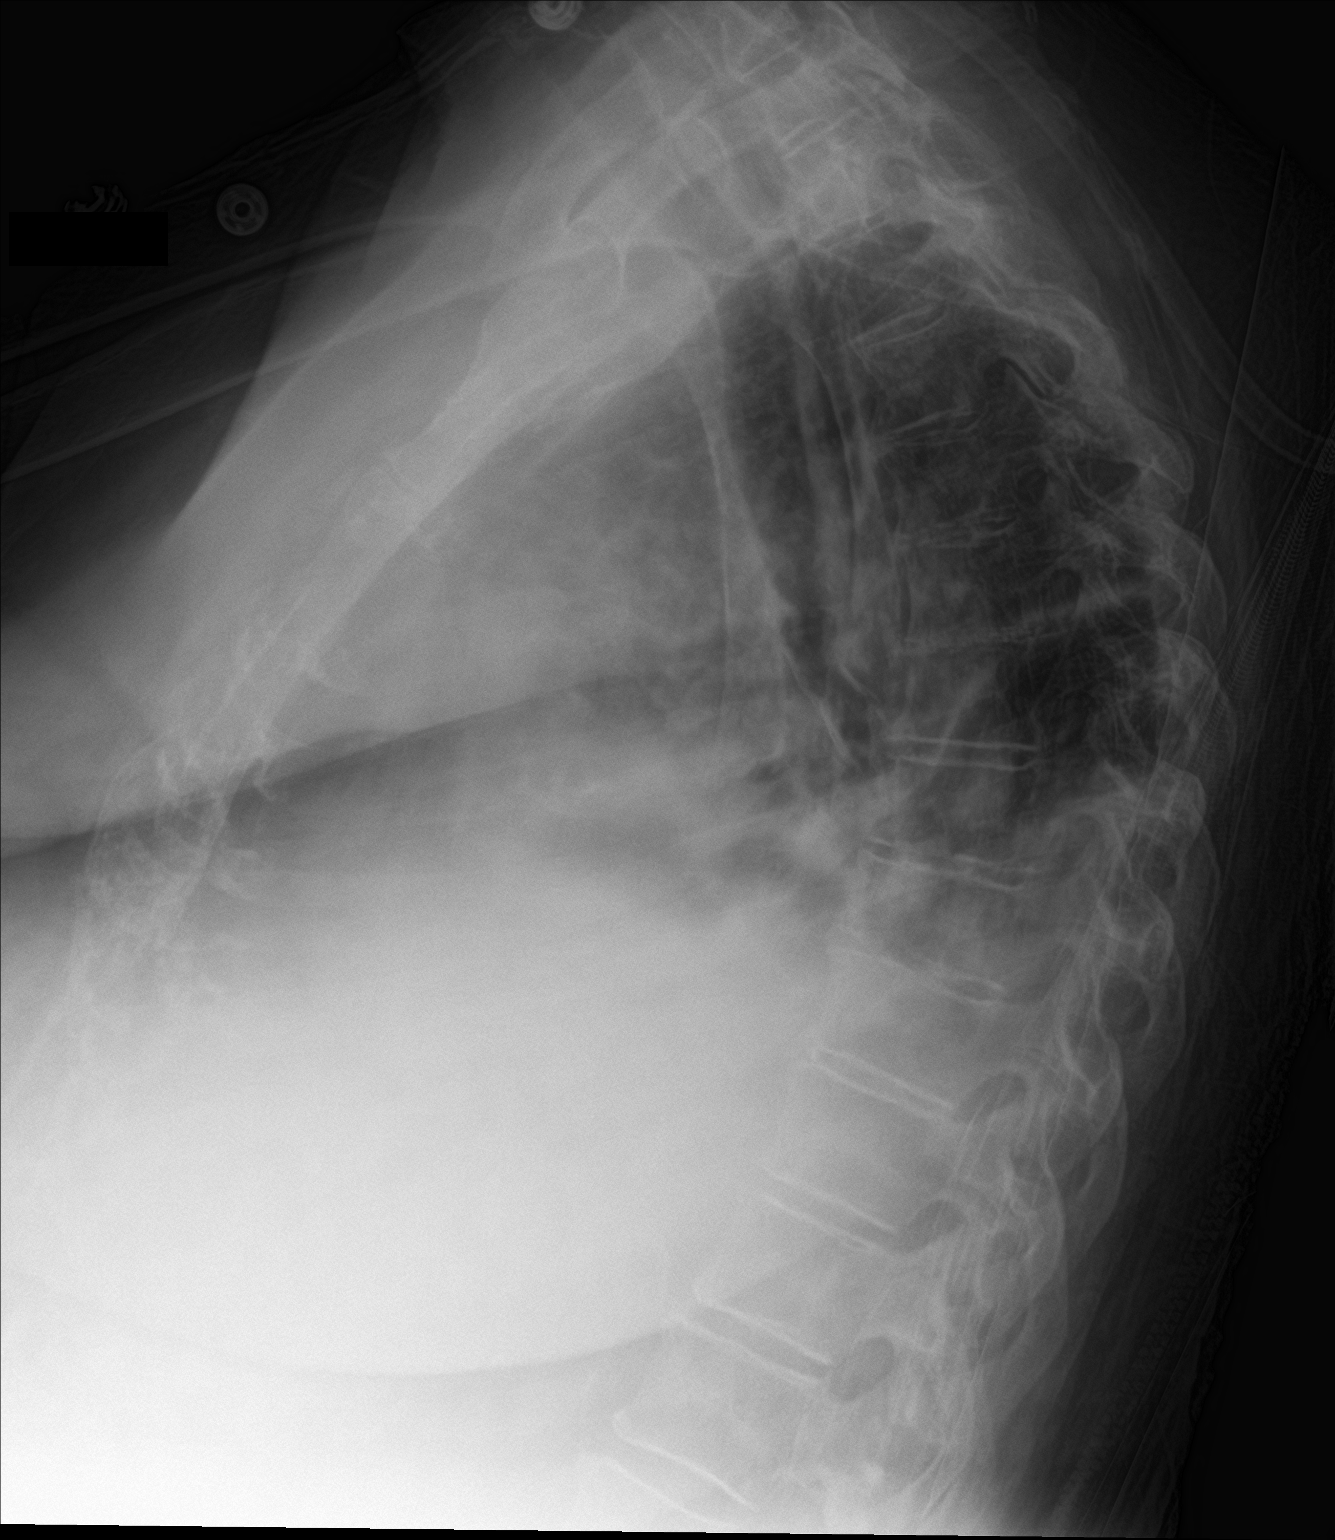

[chest ap]
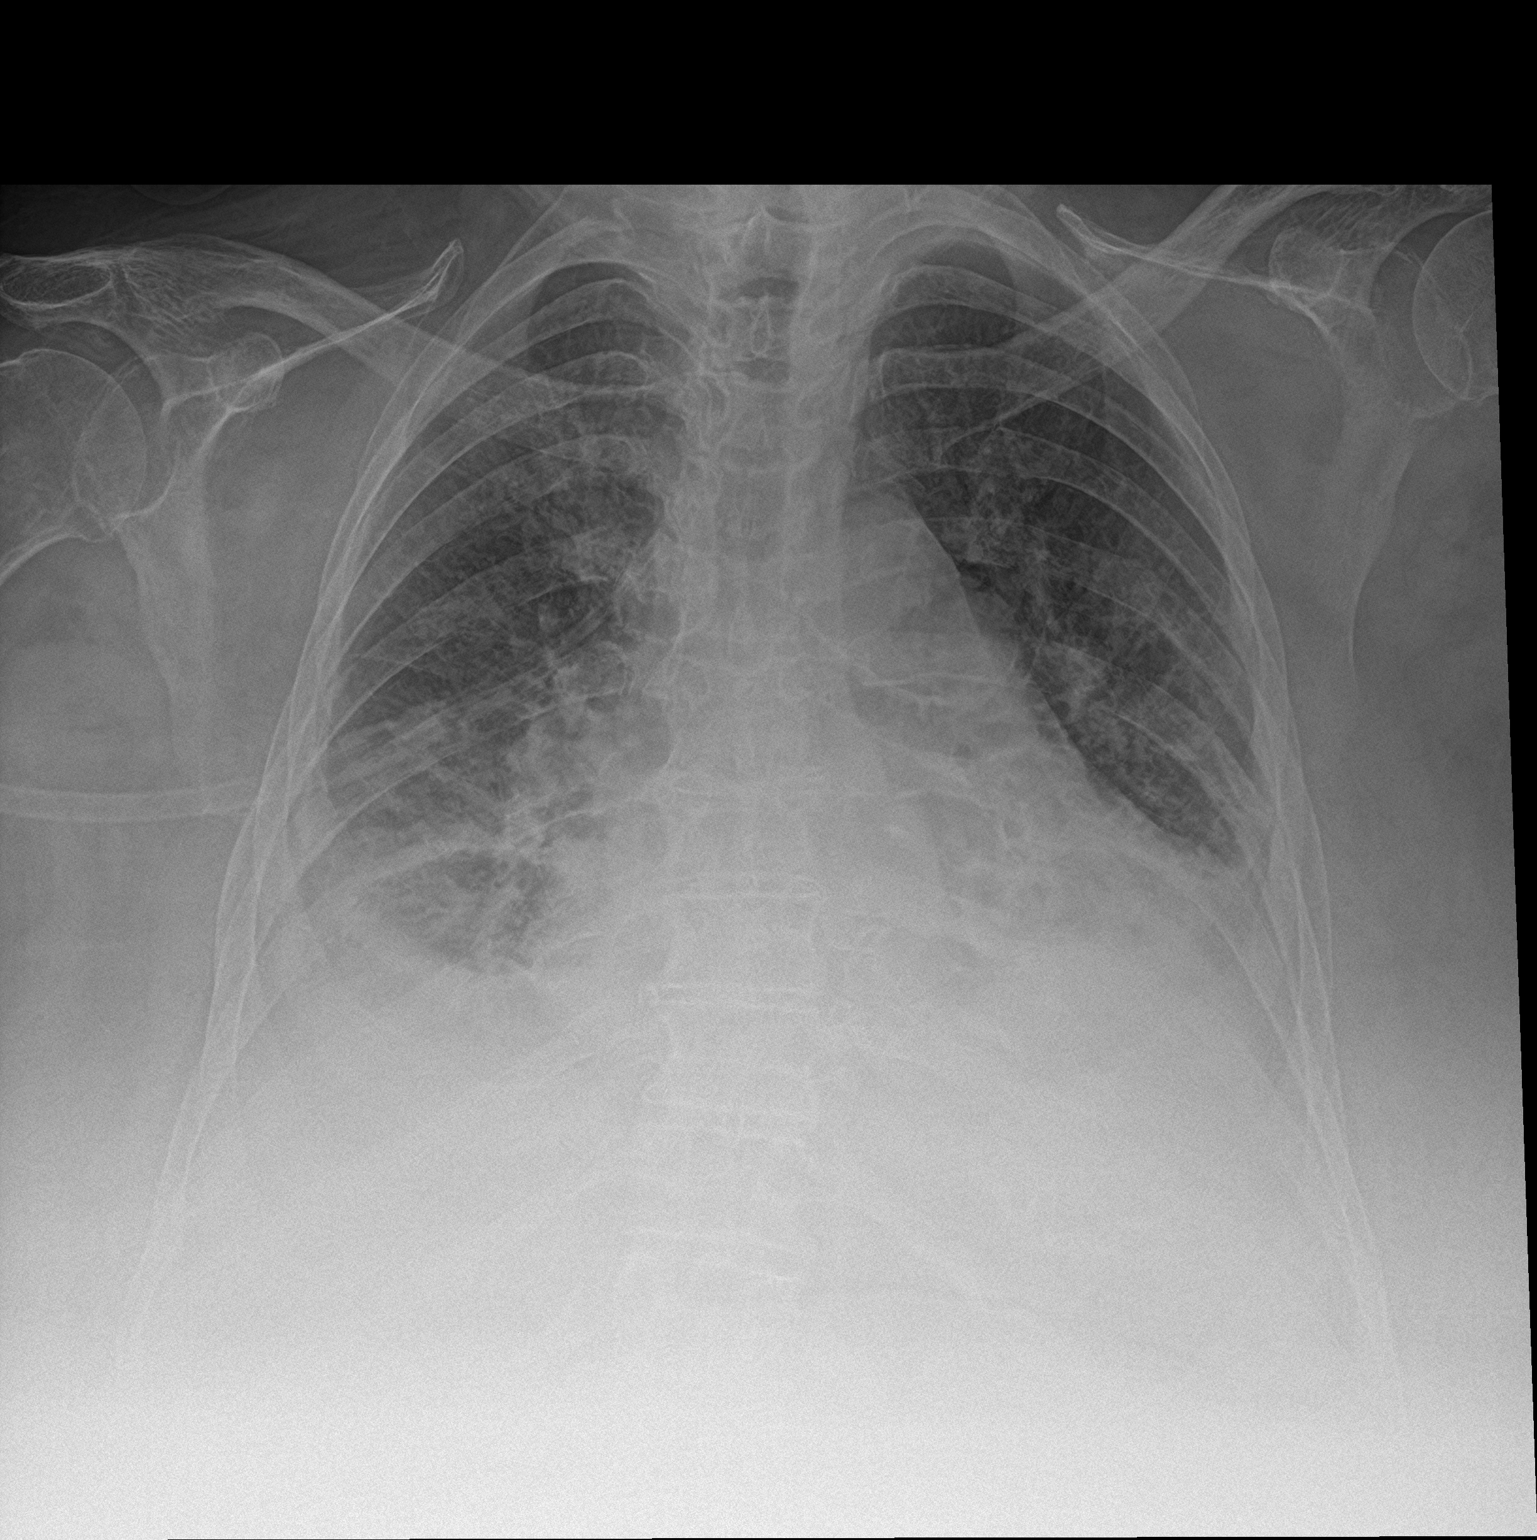

[2 of 2 positions shown; findings below may reference images not displayed]

FINDINGS: Stable cardiomediastinal silhouette with mild cardiomegaly. No
pneumothorax. Small bilateral pleural effusions, slightly increased
bilaterally. No overt pulmonary edema. Patchy bibasilar
consolidation is slightly worsened bilaterally.
IMPRESSION: 1. Small bilateral pleural effusions, slightly increased
bilaterally.
2. Patchy bibasilar lung consolidation, slightly worsened
bilaterally, which could represent atelectasis, aspiration and/or
pneumonia.
3. Stable mild cardiomegaly without overt pulmonary edema.

## 2020-05-31 ENCOUNTER — Encounter (INDEPENDENT_AMBULATORY_CARE_PROVIDER_SITE_OTHER): Payer: Medicare HMO

## 2020-05-31 ENCOUNTER — Ambulatory Visit (INDEPENDENT_AMBULATORY_CARE_PROVIDER_SITE_OTHER): Payer: Medicare HMO | Admitting: Nurse Practitioner

## 2020-07-04 IMAGING — DX DG CHEST 2V
2 series · 2 of 2 positions shown · non-contrast
Comparison: Prior chest x-ray 07/10/2018

CLINICAL DATA: 77-year-old female with decreased level of
consciousness. Recent UTI.

EXAM:
CHEST - 2 VIEW

[chest lat]
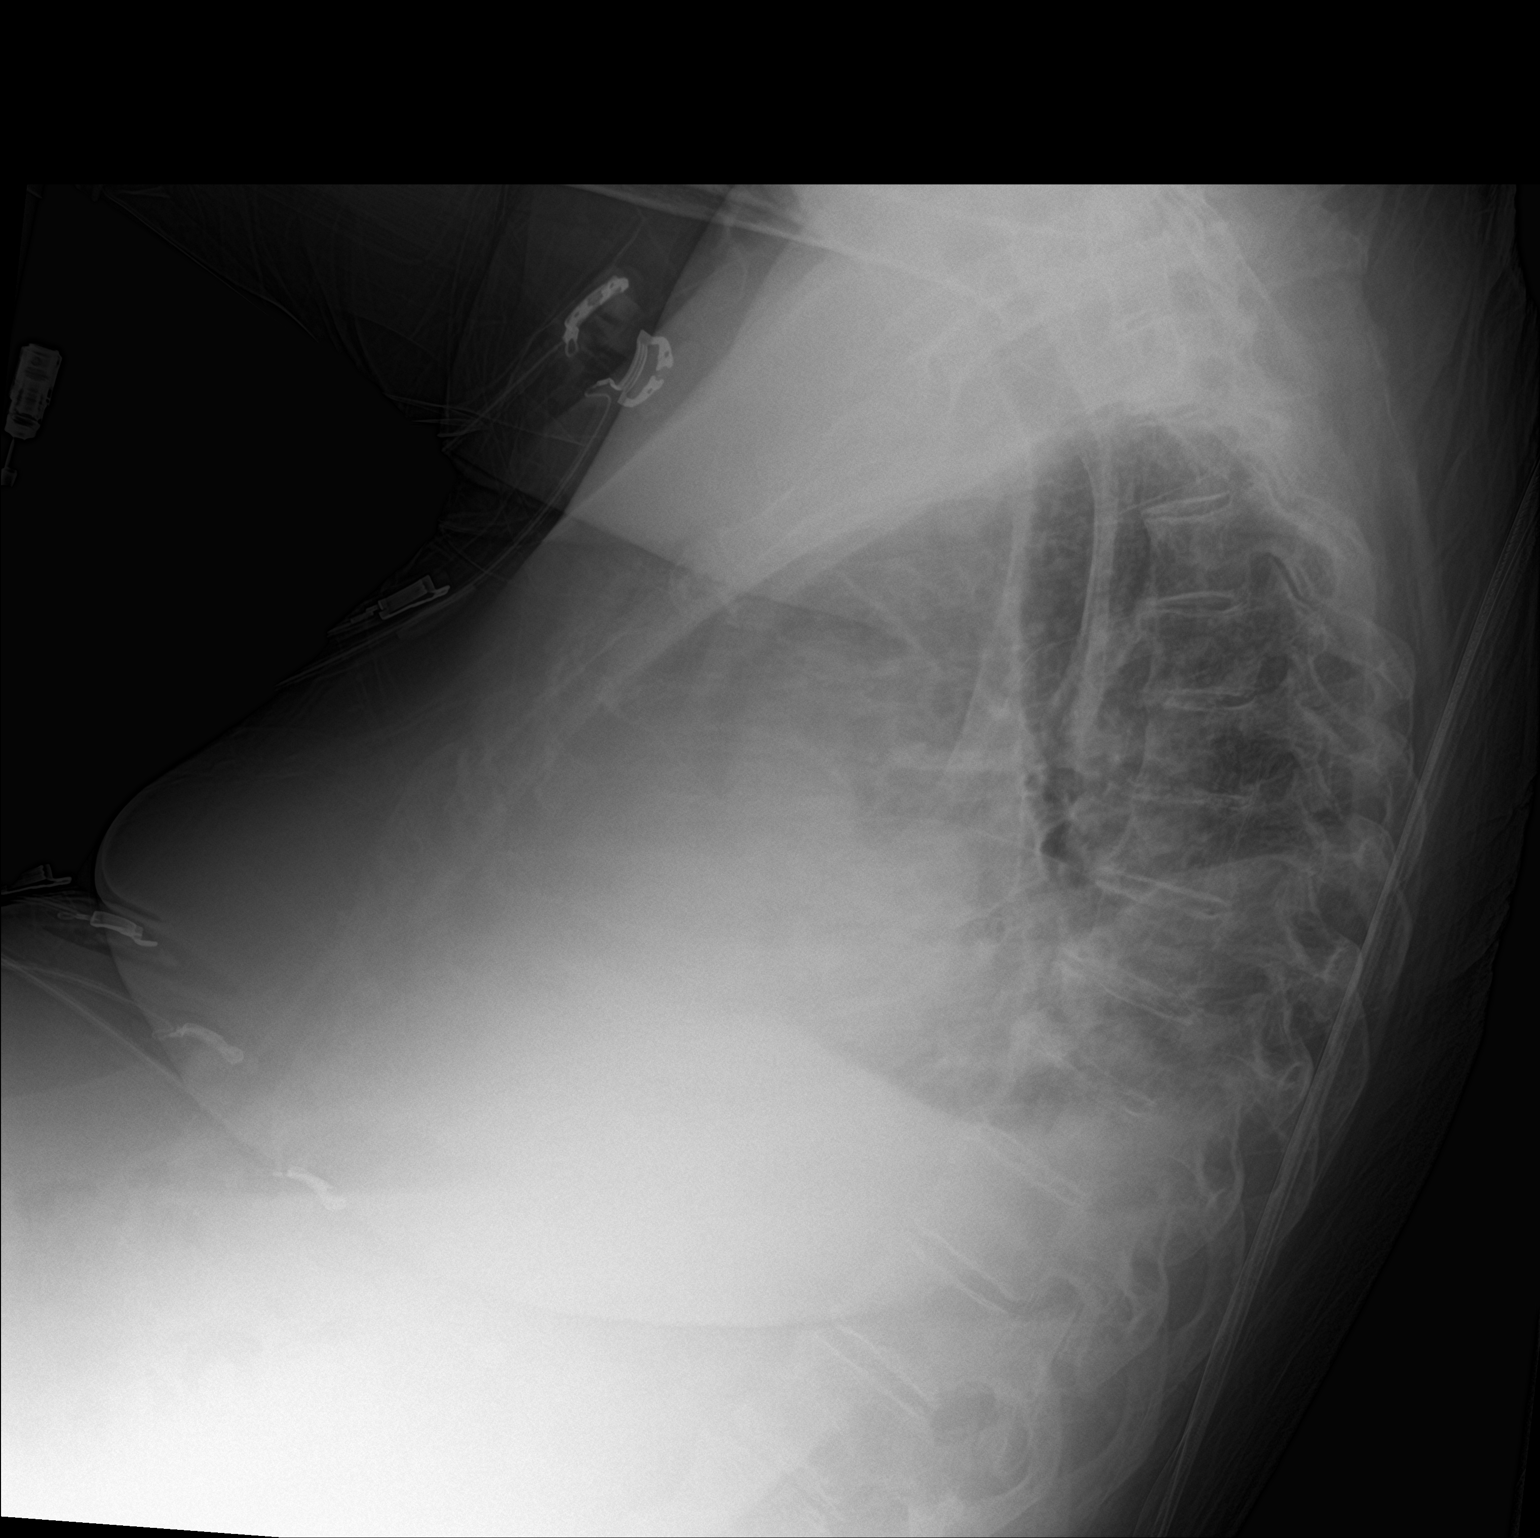

[chest ap]
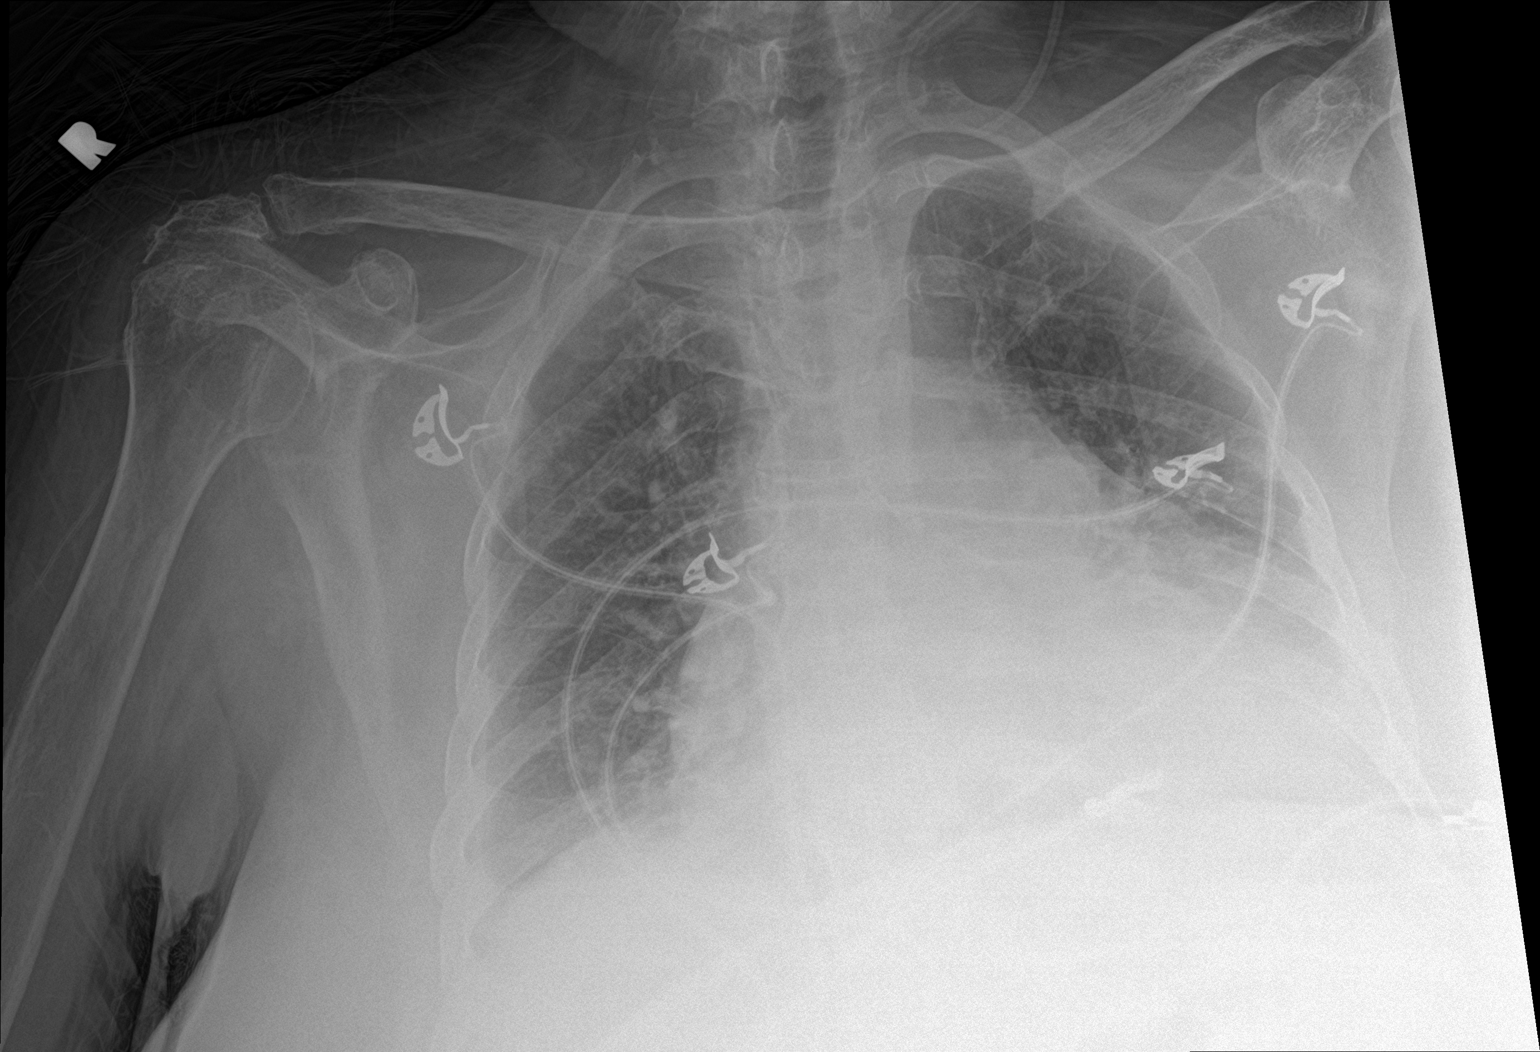

[2 of 2 positions shown; findings below may reference images not displayed]

FINDINGS: Stable cardiomegaly. Mild pulmonary vascular congestion with perhaps
trace edema. Small bilateral pleural effusions. Low inspiratory
volumes. No acute osseous abnormality.
IMPRESSION: Mild CHF.

Small bilateral layering pleural effusions.

## 2020-10-19 ENCOUNTER — Emergency Department: Payer: Medicare HMO

## 2020-10-19 ENCOUNTER — Encounter: Payer: Self-pay | Admitting: Emergency Medicine

## 2020-10-19 ENCOUNTER — Ambulatory Visit: Payer: Medicare HMO | Attending: Family Medicine

## 2020-10-19 ENCOUNTER — Other Ambulatory Visit: Payer: Self-pay

## 2020-10-19 ENCOUNTER — Inpatient Hospital Stay
Admission: EM | Admit: 2020-10-19 | Discharge: 2020-10-23 | DRG: 252 | Disposition: A | Discharge status: Home Health | Payer: Medicare HMO | Attending: Internal Medicine | Admitting: Internal Medicine

## 2020-10-19 ENCOUNTER — Inpatient Hospital Stay: Payer: Medicare HMO

## 2020-10-19 DIAGNOSIS — Z20822 Contact with and (suspected) exposure to covid-19: Secondary | ICD-10-CM | POA: Diagnosis present

## 2020-10-19 DIAGNOSIS — I70263 Atherosclerosis of native arteries of extremities with gangrene, bilateral legs: Secondary | ICD-10-CM | POA: Diagnosis present

## 2020-10-19 DIAGNOSIS — Z79899 Other long term (current) drug therapy: Secondary | ICD-10-CM

## 2020-10-19 DIAGNOSIS — Z882 Allergy status to sulfonamides status: Secondary | ICD-10-CM | POA: Diagnosis not present

## 2020-10-19 DIAGNOSIS — Z885 Allergy status to narcotic agent status: Secondary | ICD-10-CM

## 2020-10-19 DIAGNOSIS — K219 Gastro-esophageal reflux disease without esophagitis: Secondary | ICD-10-CM | POA: Diagnosis present

## 2020-10-19 DIAGNOSIS — E785 Hyperlipidemia, unspecified: Secondary | ICD-10-CM | POA: Diagnosis present

## 2020-10-19 DIAGNOSIS — E1140 Type 2 diabetes mellitus with diabetic neuropathy, unspecified: Secondary | ICD-10-CM | POA: Diagnosis present

## 2020-10-19 DIAGNOSIS — I70261 Atherosclerosis of native arteries of extremities with gangrene, right leg: Secondary | ICD-10-CM | POA: Diagnosis not present

## 2020-10-19 DIAGNOSIS — R5381 Other malaise: Secondary | ICD-10-CM | POA: Insufficient documentation

## 2020-10-19 DIAGNOSIS — Z91013 Allergy to seafood: Secondary | ICD-10-CM | POA: Diagnosis not present

## 2020-10-19 DIAGNOSIS — I96 Gangrene, not elsewhere classified: Secondary | ICD-10-CM

## 2020-10-19 DIAGNOSIS — Z8249 Family history of ischemic heart disease and other diseases of the circulatory system: Secondary | ICD-10-CM | POA: Diagnosis not present

## 2020-10-19 DIAGNOSIS — N3000 Acute cystitis without hematuria: Secondary | ICD-10-CM | POA: Diagnosis present

## 2020-10-19 DIAGNOSIS — L03031 Cellulitis of right toe: Secondary | ICD-10-CM

## 2020-10-19 DIAGNOSIS — Z8619 Personal history of other infectious and parasitic diseases: Secondary | ICD-10-CM

## 2020-10-19 DIAGNOSIS — L03115 Cellulitis of right lower limb: Secondary | ICD-10-CM | POA: Diagnosis present

## 2020-10-19 DIAGNOSIS — I350 Nonrheumatic aortic (valve) stenosis: Secondary | ICD-10-CM | POA: Diagnosis present

## 2020-10-19 DIAGNOSIS — E1152 Type 2 diabetes mellitus with diabetic peripheral angiopathy with gangrene: Secondary | ICD-10-CM | POA: Diagnosis present

## 2020-10-19 DIAGNOSIS — Z880 Allergy status to penicillin: Secondary | ICD-10-CM

## 2020-10-19 DIAGNOSIS — I739 Peripheral vascular disease, unspecified: Secondary | ICD-10-CM | POA: Diagnosis present

## 2020-10-19 DIAGNOSIS — Z91012 Allergy to eggs: Secondary | ICD-10-CM | POA: Diagnosis not present

## 2020-10-19 DIAGNOSIS — Z883 Allergy status to other anti-infective agents status: Secondary | ICD-10-CM | POA: Diagnosis not present

## 2020-10-19 DIAGNOSIS — Z833 Family history of diabetes mellitus: Secondary | ICD-10-CM

## 2020-10-19 DIAGNOSIS — Z8673 Personal history of transient ischemic attack (TIA), and cerebral infarction without residual deficits: Secondary | ICD-10-CM | POA: Diagnosis not present

## 2020-10-19 DIAGNOSIS — R319 Hematuria, unspecified: Secondary | ICD-10-CM

## 2020-10-19 DIAGNOSIS — Z7951 Long term (current) use of inhaled steroids: Secondary | ICD-10-CM

## 2020-10-19 DIAGNOSIS — I10 Essential (primary) hypertension: Secondary | ICD-10-CM | POA: Diagnosis present

## 2020-10-19 DIAGNOSIS — Z823 Family history of stroke: Secondary | ICD-10-CM | POA: Diagnosis not present

## 2020-10-19 DIAGNOSIS — J449 Chronic obstructive pulmonary disease, unspecified: Secondary | ICD-10-CM | POA: Diagnosis present

## 2020-10-19 DIAGNOSIS — G9341 Metabolic encephalopathy: Secondary | ICD-10-CM | POA: Diagnosis present

## 2020-10-19 DIAGNOSIS — Z89422 Acquired absence of other left toe(s): Secondary | ICD-10-CM | POA: Diagnosis not present

## 2020-10-19 DIAGNOSIS — L0390 Cellulitis, unspecified: Secondary | ICD-10-CM | POA: Diagnosis present

## 2020-10-19 DIAGNOSIS — I5032 Chronic diastolic (congestive) heart failure: Secondary | ICD-10-CM | POA: Diagnosis present

## 2020-10-19 DIAGNOSIS — E782 Mixed hyperlipidemia: Secondary | ICD-10-CM | POA: Diagnosis present

## 2020-10-19 DIAGNOSIS — E119 Type 2 diabetes mellitus without complications: Secondary | ICD-10-CM

## 2020-10-19 DIAGNOSIS — I110 Hypertensive heart disease with heart failure: Secondary | ICD-10-CM | POA: Diagnosis present

## 2020-10-19 DIAGNOSIS — M6281 Muscle weakness (generalized): Secondary | ICD-10-CM | POA: Insufficient documentation

## 2020-10-19 DIAGNOSIS — Z7982 Long term (current) use of aspirin: Secondary | ICD-10-CM

## 2020-10-19 DIAGNOSIS — Z7902 Long term (current) use of antithrombotics/antiplatelets: Secondary | ICD-10-CM

## 2020-10-19 DIAGNOSIS — R2689 Other abnormalities of gait and mobility: Secondary | ICD-10-CM | POA: Insufficient documentation

## 2020-10-19 DIAGNOSIS — Z794 Long term (current) use of insulin: Secondary | ICD-10-CM

## 2020-10-19 LAB — COMPREHENSIVE METABOLIC PANEL
ALT: 36 U/L (ref 0–44)
AST: 30 U/L (ref 15–41)
Albumin: 3.3 g/dL — ABNORMAL LOW (ref 3.5–5.0)
Alkaline Phosphatase: 57 U/L (ref 38–126)
Anion gap: 9 (ref 5–15)
BUN: 21 mg/dL (ref 8–23)
CO2: 25 mmol/L (ref 22–32)
Calcium: 9.1 mg/dL (ref 8.9–10.3)
Chloride: 103 mmol/L (ref 98–111)
Creatinine, Ser: 0.6 mg/dL (ref 0.44–1.00)
GFR, Estimated: >60 mL/min (ref >60)
Glucose, Bld: 237 mg/dL — ABNORMAL HIGH (ref 70–99)
Potassium: 4 mmol/L (ref 3.5–5.1)
Sodium: 137 mmol/L (ref 135–145)
Total Bilirubin: 0.8 mg/dL (ref 0.3–1.2)
Total Protein: 6 g/dL — ABNORMAL LOW (ref 6.5–8.1)

## 2020-10-19 LAB — CBC WITH DIFFERENTIAL/PLATELET
Abs Immature Granulocytes: 0.06 10*3/uL (ref 0.00–0.07)
Basophils Absolute: 0.1 10*3/uL (ref 0.0–0.1)
Basophils Relative: 1 %
Eosinophils Absolute: 0.3 10*3/uL (ref 0.0–0.5)
Eosinophils Relative: 3 %
HCT: 38.3 % (ref 36.0–46.0)
Hemoglobin: 12.1 g/dL (ref 12.0–15.0)
Immature Granulocytes: 1 %
Lymphocytes Relative: 20 %
Lymphs Abs: 2.5 10*3/uL (ref 0.7–4.0)
MCH: 26.9 pg (ref 26.0–34.0)
MCHC: 31.6 g/dL (ref 30.0–36.0)
MCV: 85.1 fL (ref 80.0–100.0)
Monocytes Absolute: 0.8 10*3/uL (ref 0.1–1.0)
Monocytes Relative: 7 %
Neutro Abs: 8.9 10*3/uL — ABNORMAL HIGH (ref 1.7–7.7)
Neutrophils Relative %: 68 %
Platelets: 240 10*3/uL (ref 150–400)
RBC: 4.5 MIL/uL (ref 3.87–5.11)
RDW: 14.6 % (ref 11.5–15.5)
WBC: 12.8 10*3/uL — ABNORMAL HIGH (ref 4.0–10.5)
nRBC: 0 % (ref 0.0–0.2)

## 2020-10-19 LAB — URINALYSIS, COMPLETE (UACMP) WITH MICROSCOPIC
Bilirubin Urine: NEGATIVE
Glucose, UA: NEGATIVE mg/dL
Ketones, ur: NEGATIVE mg/dL
Nitrite: POSITIVE — AB
Protein, ur: NEGATIVE mg/dL
Specific Gravity, Urine: 1.011 (ref 1.005–1.030)
Squamous Epithelial / HPF: NONE SEEN (ref 0–5)
WBC, UA: >50 WBC/hpf — ABNORMAL HIGH (ref 0–5)
pH: 7 (ref 5.0–8.0)

## 2020-10-19 LAB — CBG MONITORING, ED
Glucose-Capillary: 150 mg/dL — ABNORMAL HIGH (ref 70–99)
Glucose-Capillary: 99 mg/dL (ref 70–99)

## 2020-10-19 LAB — LACTIC ACID, PLASMA
Lactic Acid, Venous: 1.7 mmol/L (ref 0.5–1.9)
Lactic Acid, Venous: 1.4 mmol/L (ref 0.5–1.9)

## 2020-10-19 LAB — RESP PANEL BY RT-PCR (FLU A&B, COVID) ARPGX2
Influenza A by PCR: NEGATIVE
Influenza B by PCR: NEGATIVE
SARS Coronavirus 2 by RT PCR: NEGATIVE

## 2020-10-19 LAB — PROTIME-INR
INR: 1 (ref 0.8–1.2)
Prothrombin Time: 13 seconds (ref 11.4–15.2)

## 2020-10-19 LAB — HEMOGLOBIN A1C
Hgb A1c MFr Bld: 9 % — ABNORMAL HIGH (ref 4.8–5.6)
Mean Plasma Glucose: 211.6 mg/dL

## 2020-10-19 LAB — APTT: aPTT: 27 seconds (ref 24–36)

## 2020-10-19 MED ORDER — PANTOPRAZOLE SODIUM 40 MG PO TBEC: 40.0000 mg | DELAYED_RELEASE_TABLET | Freq: Every day | ORAL | Status: DC

## 2020-10-19 MED ORDER — HYDRALAZINE HCL 20 MG/ML IJ SOLN: 5.0000 mg | Freq: Four times a day (QID) | INTRAMUSCULAR | Status: DC | PRN

## 2020-10-19 MED ORDER — SODIUM CHLORIDE 0.9 % IV SOLN
1.0000 g | Freq: Three times a day (TID) | INTRAVENOUS | Status: AC
  Administered 2020-10-19 – 2020-10-20 (×4): 1 g via INTRAVENOUS
  Filled 2020-10-19 (×4): qty 1

## 2020-10-19 MED ORDER — INSULIN ASPART 100 UNIT/ML ~~LOC~~ SOLN
0.0000 [IU] | Freq: Three times a day (TID) | SUBCUTANEOUS | Status: DC
  Administered 2020-10-19: 2 [IU] via SUBCUTANEOUS
  Administered 2020-10-21: 5 [IU] via SUBCUTANEOUS
  Administered 2020-10-21 – 2020-10-22 (×2): 3 [IU] via SUBCUTANEOUS
  Administered 2020-10-22 (×2): 2 [IU] via SUBCUTANEOUS
  Administered 2020-10-23: 5 [IU] via SUBCUTANEOUS
  Administered 2020-10-23: 3 [IU] via SUBCUTANEOUS
  Filled 2020-10-19 (×8): qty 1

## 2020-10-19 MED ORDER — INSULIN ASPART 100 UNIT/ML ~~LOC~~ SOLN: 0.0000 [IU] | Freq: Every day | SUBCUTANEOUS | Status: DC

## 2020-10-19 MED ORDER — ATORVASTATIN CALCIUM 20 MG PO TABS
20.0000 mg | ORAL_TABLET | Freq: Every day | ORAL | Status: DC
  Administered 2020-10-19 – 2020-10-22 (×4): 20 mg via ORAL
  Filled 2020-10-19 (×4): qty 1

## 2020-10-19 MED ORDER — ONDANSETRON HCL 4 MG/2ML IJ SOLN: 4.0000 mg | Freq: Four times a day (QID) | INTRAMUSCULAR | Status: DC | PRN

## 2020-10-19 MED ORDER — HEPARIN (PORCINE) 25000 UT/250ML-% IV SOLN
950.0000 [IU]/h | INTRAVENOUS | Status: DC
  Administered 2020-10-19: 800 [IU]/h via INTRAVENOUS
  Filled 2020-10-19: qty 250

## 2020-10-19 MED ORDER — OXYCODONE HCL 5 MG PO TABS
5.0000 mg | ORAL_TABLET | Freq: Four times a day (QID) | ORAL | Status: DC | PRN
  Administered 2020-10-19 – 2020-10-23 (×5): 5 mg via ORAL
  Filled 2020-10-19 (×5): qty 1

## 2020-10-19 MED ORDER — HEPARIN BOLUS VIA INFUSION
3000.0000 [IU] | Freq: Once | INTRAVENOUS | Status: AC
  Administered 2020-10-19: 3000 [IU] via INTRAVENOUS
  Filled 2020-10-19: qty 3000

## 2020-10-19 MED ORDER — PANTOPRAZOLE SODIUM 40 MG PO TBEC
40.0000 mg | DELAYED_RELEASE_TABLET | Freq: Every day | ORAL | Status: DC
  Administered 2020-10-19 – 2020-10-23 (×5): 40 mg via ORAL
  Filled 2020-10-19 (×5): qty 1

## 2020-10-19 MED ORDER — CARVEDILOL 6.25 MG PO TABS
6.2500 mg | ORAL_TABLET | Freq: Two times a day (BID) | ORAL | Status: DC
  Administered 2020-10-19 – 2020-10-23 (×7): 6.25 mg via ORAL
  Filled 2020-10-19 (×7): qty 1

## 2020-10-19 MED ORDER — ACETAMINOPHEN 325 MG PO TABS
650.0000 mg | ORAL_TABLET | Freq: Four times a day (QID) | ORAL | Status: DC | PRN
  Administered 2020-10-20: 650 mg via ORAL
  Filled 2020-10-19 (×2): qty 2

## 2020-10-19 MED ORDER — BUDESONIDE 0.25 MG/2ML IN SUSP
0.2500 mg | Freq: Two times a day (BID) | RESPIRATORY_TRACT | Status: DC
  Administered 2020-10-19 – 2020-10-23 (×7): 0.25 mg via RESPIRATORY_TRACT
  Filled 2020-10-19 (×7): qty 2

## 2020-10-19 MED ORDER — VANCOMYCIN HCL 1250 MG/250ML IV SOLN
1250.0000 mg | INTRAVENOUS | Status: DC
  Administered 2020-10-20 – 2020-10-22 (×3): 1250 mg via INTRAVENOUS
  Filled 2020-10-19 (×4): qty 250

## 2020-10-19 MED ORDER — HYDRALAZINE HCL 25 MG PO TABS
25.0000 mg | ORAL_TABLET | Freq: Three times a day (TID) | ORAL | Status: DC
Start: 2020-10-19 — End: 2020-10-23
  Administered 2020-10-19 – 2020-10-23 (×12): 25 mg via ORAL
  Filled 2020-10-19 (×12): qty 1

## 2020-10-19 MED ORDER — CLOPIDOGREL BISULFATE 75 MG PO TABS
75.0000 mg | ORAL_TABLET | Freq: Every day | ORAL | Status: DC
  Administered 2020-10-20 – 2020-10-23 (×4): 75 mg via ORAL
  Filled 2020-10-19 (×4): qty 1

## 2020-10-19 MED ORDER — LACTATED RINGERS IV SOLN: INTRAVENOUS | Status: AC

## 2020-10-19 MED ORDER — CLINDAMYCIN PHOSPHATE 600 MG/50ML IV SOLN: 600.0000 mg | Freq: Three times a day (TID) | INTRAVENOUS | Status: DC

## 2020-10-19 MED ORDER — AMLODIPINE BESYLATE 10 MG PO TABS
10.0000 mg | ORAL_TABLET | Freq: Every day | ORAL | Status: DC
  Administered 2020-10-19 – 2020-10-23 (×5): 10 mg via ORAL
  Filled 2020-10-19: qty 2
  Filled 2020-10-19 (×2): qty 1
  Filled 2020-10-19: qty 2
  Filled 2020-10-19: qty 1

## 2020-10-19 MED ORDER — VANCOMYCIN HCL 1500 MG/300ML IV SOLN
1500.0000 mg | Freq: Once | INTRAVENOUS | Status: AC
  Administered 2020-10-19: 1500 mg via INTRAVENOUS
  Filled 2020-10-19: qty 300

## 2020-10-19 NOTE — ED Notes (Signed)
Patient transported to CT 

## 2020-10-19 NOTE — ED Notes (Signed)
Patient transported to Ultrasound 

## 2020-10-19 NOTE — ED Notes (Signed)
IV starts attempted x2 by ED RN's without success. IV team consult placed

## 2020-10-19 NOTE — ED Provider Notes (Signed)
  Patient received in signout from Dr. Quentin Cornwall pending CT head and admission.  CT head reviewed without acute processes.  Patient multiple failed IV attempts by nursing.  I placed ultrasound IV, as below.  Will discuss the case with hospitalist medicine for admission for IV antibiotics and vascular surgery evaluation.  [name]1-3 Lead EKG Interpretation Performed by: Vladimir Crofts, MD Authorized by: Vladimir Crofts, MD     Interpretation: normal     ECG rate:  74   ECG rate assessment: normal     Rhythm: sinus rhythm     Ectopy: none     Conduction: normal   Ultrasound ED Peripheral IV (Provider)  Date/Time: 10/19/2020 4:11 PM Performed by: Vladimir Crofts, MD Authorized by: Vladimir Crofts, MD   Procedure details:    Indications: multiple failed IV attempts and poor IV access     Skin Prep: chlorhexidine gluconate     Location: left cephalic v.   Angiocath:  20 G   Bedside Ultrasound Guided: Yes     Images: not archived     Patient tolerated procedure without complications: Yes     Dressing applied: Yes        Vladimir Crofts, MD 10/19/20 [phone]

## 2020-10-19 NOTE — Consult Note (Signed)
Pharmacy Antibiotic Note  [name] is a 80 y.o. female with medical history including PAD, diabetes, HTN, diastolic CHF, HTN, HLD admitted on 10/19/2020 with right foot cellulitis / UTI. 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 Patient also has a history of ESBL E coli from urine culture in 05/2019. Pharmacy has been consulted for vancomycin and meropenem dosing.  Patient has a history of PCN allergy (anaphylaxis) but has tolerated cephalosporins in the past including cefazolin and ceftriaxone.   Plan: Meropenem 1 g IV q8h  Vancomycin 1.5 g IV LD (~23 mg/kg) followed by maintenance dose of vancomycin 1250 mg IV q24h --Calculated AUC: 488, Cmin 10.8 --Daily Scr per protocol while on vancomycin --Levels at steady state as indicated  Continue to monitor renal function and adjust antibiotics as indicated. Continue to monitor culture results and ability to narrow antibiotics.  Height: 5\' 8"  (172.7 cm) Weight: 66.2 kg (145 lb 15.1 oz) IBW/kg (Calculated) : 63.9  Temp (24hrs), Avg:98.3 F (36.8 C), Min:98.3 F (36.8 C), Max:98.3 F (36.8 C)  Recent Labs  Lab 10/19/20 1204 10/19/20 1839  WBC 12.8*  --   CREATININE 0.60  --   LATICACIDVEN 1.7 1.4    Estimated Creatinine Clearance: 59.4 mL/min (by C-G formula based on SCr of 0.6 mg/dL).    Allergies  Allergen Reactions  . Iodine Anaphylaxis  . Penicillins Anaphylaxis    Tolerates ceftriaxone, cefazolin Did it involve swelling of the face/tongue/throat, SOB, or low BP? Unknown Did it involve sudden or severe rash/hives, skin peeling, or any reaction on the inside of your mouth or nose? Unknown Did you need to seek medical attention at a hospital or doctor's office? Unknown When did it last happen?Unknown If all above answers are "NO", may proceed with cephalosporin use.   . Shellfish Allergy Anaphylaxis  . Sulfa Antibiotics Anaphylaxis  . Sulfacetamide Sodium Anaphylaxis  .  Sulfasalazine Anaphylaxis  . Eggs Or Egg-Derived Products     Other reaction(s): SHORTNESS OF BREATH  . Morphine And Related Other (See Comments)    Altered mental status    Antimicrobials this admission: Meropenem 3/17 >>  Vancomycin 3/17 >>   Dose adjustments this admission: N/A  Microbiology results: 3/17 BCx: pending 3/17 UCx: pending  3/17 MRSA PCR: pending  Thank you for allowing pharmacy to be a part of this patient's care.  Benita Gutter 10/19/2020 8:08 PM

## 2020-10-19 NOTE — Progress Notes (Signed)
VAST consulted to obtain 2nd IV access for fluids. Assessed patient's left arm utilizing [place], but did not see any appropriate vessels. Also assessed patient's right arm and attempted USGIV x1 unsuccessfully. Notified ED RN as well as [place] Crane, MD.

## 2020-10-19 NOTE — ED Triage Notes (Signed)
Pt comes into the ED via POV c/o right foot pain and discoloration.  Pt had an injury several years ago to the foot.  Pt already established with the vascular MD's.  Pt has darkening toes and skin that is scabbing around the foot.  Pt denies any pain at this time.

## 2020-10-19 NOTE — Consult Note (Signed)
[place] for Heparin Indication: limb ischemia, gangrene of toe  Allergies  Allergen Reactions  . Iodine Anaphylaxis  . Penicillins Anaphylaxis    Tolerates ceftriaxone, cefazolin Did it involve swelling of the face/tongue/throat, SOB, or low BP? Unknown Did it involve sudden or severe rash/hives, skin peeling, or any reaction on the inside of your mouth or nose? Unknown Did you need to seek medical attention at a hospital or doctor's office? Unknown When did it last happen?Unknown If all above answers are "NO", may proceed with cephalosporin use.   . Shellfish Allergy Anaphylaxis  . Sulfa Antibiotics Anaphylaxis  . Sulfacetamide Sodium Anaphylaxis  . Sulfasalazine Anaphylaxis  . Eggs Or Egg-Derived Products     Other reaction(s): SHORTNESS OF BREATH  . Morphine And Related Other (See Comments)    Altered mental status    Patient Measurements: Height: 5\' 8"  (172.7 cm) Weight: 66.2 kg (145 lb 15.1 oz) IBW/kg (Calculated) : 63.9 Heparin Dosing Weight: 66.2 kg  Vital Signs: Temp: 98.3 F (36.8 C) (03/17 1201) Temp Source: Oral (03/17 1201) BP: 186/58 (03/17 1414) Pulse Rate: 66 (03/17 1414)  Labs: Recent Labs    10/19/20 1204  HGB 12.1  HCT 38.3  PLT 240  CREATININE 0.60    Estimated Creatinine Clearance: 59.4 mL/min (by C-G formula based on SCr of 0.6 mg/dL).   Medical History: Past Medical History:  Diagnosis Date  . Acute heart failure ([place])   . Aortic stenosis   . CHF (congestive heart failure) ([place])   . Complication of anesthesia    Hard to wake patient up after having anesthesia  . Diabetes mellitus without complication ([place])   . Diabetic neuropathy ([facility])    Takes Lyrica  . Edema of both legs    Takes Lasix  . GERD (gastroesophageal reflux disease)   . High cholesterol   . HTN (hypertension)   . Hypertension   . PAD (peripheral artery disease) ([place])   . Shortness of breath dyspnea   . Spasm of back  muscles   . Stroke ([place])   . Wound, open    Right great toe    Medications:  No DOAC PTA.   Assessment: Pharmacy consulted to start heparin. CBC stable.   Goal of Therapy:  Heparin level 0.3-0.7 units/ml Monitor platelets by anticoagulation protocol: Yes   Plan:  Give 3000 units bolus x 1 Start heparin infusion at 800 units/hr Check anti-Xa level in 8 hours and daily while on heparin Continue to monitor H&H and platelets  Oswald Hillock, PharmD, BCPS 10/19/2020,2:54 PM

## 2020-10-19 NOTE — ED Notes (Signed)
Patient transported to X-ray 

## 2020-10-19 NOTE — ED Provider Notes (Signed)
[facility] Emergency Department Provider Note    Event Date/Time   First MD Initiated Contact with Patient 10/19/20 1312     (approximate)  I have reviewed the triage vital signs and the nursing notes.   HISTORY  Chief Complaint Foot Pain    HPI [name] is a 80 y.o. female below listed past medical history presents to the ER for evaluation of discoloration of the right foot noticed at PT today.  Husband states that the discoloration is not as bad as it was but cannot state when he started noticing the toe turned black.  Patient is an unreliable historian.  No recent antibiotics.  No reported any recent trauma.    Past Medical History:  Diagnosis Date  . Acute heart failure ([place])   . Aortic stenosis   . CHF (congestive heart failure) ([place])   . Complication of anesthesia    Hard to wake patient up after having anesthesia  . Diabetes mellitus without complication ([place])   . Diabetic neuropathy (HCC)    Takes Lyrica  . Edema of both legs    Takes Lasix  . GERD (gastroesophageal reflux disease)   . High cholesterol   . HTN (hypertension)   . Hypertension   . PAD (peripheral artery disease) ([place])   . Shortness of breath dyspnea   . Spasm of back muscles   . Stroke ([place])   . Wound, open    Right great toe   Family History  Problem Relation Age of Onset  . Diabetes Other   . Liver disease Mother   . CVA Father   . Diabetes Father   . Hypertension Father    Past Surgical History:  Procedure Laterality Date  . AMPUTATION TOE Left 06/09/2019   Procedure: Left third toe and partial great toe amputation and debridement;  Surgeon: Katha Cabal, MD;  Location: [facility];  Service: Vascular;  Laterality: Left;  . AMPUTATION TOE Left 04/06/2020   Procedure: AMPUTATION LEFT SECOND TOE;  Surgeon: Samara Deist, DPM;  Location: [facility];  Service: Podiatry;  Laterality: Left;  . BYPASS GRAFT POPLITEAL TO TIBIAL Right 02/28/2016   Procedure:  BYPASS GRAFT RIGHT BELOW KNEE POPLITEAL TO PERONEAL USING REVERSED RIGHT GREATER SAPHENOUS VEIN;  Surgeon: Conrad Tift, MD;  Location: [place];  Service: Vascular;  Laterality: Right;  . CYST EXCISION     Abdomen  . EYE SURGERY Bilateral    Cataract removal  . INTRAMEDULLARY (IM) NAIL INTERTROCHANTERIC Left 02/04/2014   Procedure: INTRAMEDULLARY (IM) NAIL INTERTROCHANTRIC FEMORAL;  Surgeon: Mauri Pole, MD;  Location: [place];  Service: Orthopedics;  Laterality: Left;  . IR GENERIC HISTORICAL  03/01/2016   IR ANGIO INTRA EXTRACRAN SEL COM CAROTID INNOMINATE UNI R MOD SED 03/01/2016 [name], MD [facility]  . IR GENERIC HISTORICAL  03/01/2016   IR ENDOVASC INTRACRANIAL INF OTHER THAN THROMBO ART INC DIAG ANGIO 03/01/2016 [name], MD [facility]  . IR GENERIC HISTORICAL  03/01/2016   IR INTRAVSC STENT CERV CAROTID W/O EMB-PROT MOD SED INC ANGIO 03/01/2016 [name], MD [facility]  . IR GENERIC HISTORICAL  06/03/2016   IR RADIOLOGIST EVAL & MGMT 06/03/2016 [facility]  . LOWER EXTREMITY ANGIOGRAPHY Left 02/09/2019   Procedure: LOWER EXTREMITY ANGIOGRAPHY;  Surgeon: Katha Cabal, MD;  Location: [place] [facility];  Service: Cardiovascular;  Laterality: Left;  . LOWER EXTREMITY ANGIOGRAPHY Left 03/10/2019   Procedure: LOWER EXTREMITY ANGIOGRAPHY;  Surgeon: Hortencia Pilar  G, MD;  Location: [place] [facility];  Service: Cardiovascular;  Laterality: Left;  . LOWER EXTREMITY ANGIOGRAPHY Left 04/27/2019   Procedure: LOWER EXTREMITY ANGIOGRAPHY;  Surgeon: Katha Cabal, MD;  Location: [place] [facility];  Service: Cardiovascular;  Laterality: Left;  . LOWER EXTREMITY ANGIOGRAPHY Left 06/08/2019   Procedure: Lower Extremity Angiography;  Surgeon: Katha Cabal, MD;  Location: [place] [facility];  Service: Cardiovascular;  Laterality: Left;  . LOWER EXTREMITY ANGIOGRAPHY Left 04/03/2020   Procedure: Lower Extremity Angiography;  Surgeon: Algernon Huxley, MD;   Location: [place] [facility];  Service: Cardiovascular;  Laterality: Left;  . ORIF TOE FRACTURE Right 02/08/2014   Procedure: OPEN REDUCTION INTERNAL FIXATION Right METATARSAL  FRACTURE ;  Surgeon: Wylene Simmer, MD;  Location: [place];  Service: Orthopedics;  Laterality: Right;  . PERIPHERAL VASCULAR CATHETERIZATION N/A 12/28/2015   Procedure: Abdominal Aortogram w/Lower Extremity;  Surgeon: Conrad Fultonham, MD;  Location: [place] [facility];  Service: Cardiovascular;  Laterality: N/A;  . RADIOLOGY WITH ANESTHESIA N/A 03/01/2016   Procedure: RADIOLOGY WITH ANESTHESIA;  Surgeon: [name], MD;  Location: [place];  Service: Radiology;  Laterality: N/A;  . VEIN HARVEST Right 02/28/2016   Procedure: RIGHT GREATER SAPHENOUS VEIN HARVEST;  Surgeon: Conrad Mertztown, MD;  Location: [place];  Service: Vascular;  Laterality: Right;   Patient Active Problem List   Diagnosis Date Noted  . Necrotic toes ([place])   . Insulin dependent diabetes mellitus type IA ([place])   . Diabetic neuropathy ([place])   . Infected wound 03/30/2020  . Palliative care encounter   . 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 Pressure injury of skin 06/04/2019  . Recurrent UTI 06/02/2019  . UTI (urinary tract infection) 06/02/2019  . PAD (peripheral artery disease) ([place]) 04/27/2019  . ESBL (extended spectrum beta-lactamase) producing bacteria infection 12/25/2018  . Healthcare-associated pneumonia 06/03/2018  . Acute cystitis 04/10/2018  . Sepsis due to Streptococcus, group B ([place]) 09/12/2016  . Multiple open wounds of lower leg 09/12/2016  . PVD (peripheral vascular disease) ([place]) 09/12/2016  . Bacteremia 08/31/2016  . Ulcer of right lower extremity ([place]) 08/29/2016  . Sepsis ([place]) 08/29/2016  . Acute on chronic diastolic heart failure ([place]) 07/08/2016  . HTN (hypertension) 07/05/2016  . Chest pain 07/05/2016  . Impacted cerumen of both ears 06/05/2016  . 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 Chronic  diastolic CHF (congestive heart failure) ([place]) 03/10/2016  . HLD (hyperlipidemia) 03/09/2016  . Pleural effusion, bilateral 03/09/2016  . Wheeze 03/09/2016  . Dyspnea 03/08/2016  . Right sided weakness 03/05/2016  . Acute respiratory acidosis 03/05/2016  . Surgery, elective 03/04/2016  . Anemia, iron deficiency 03/03/2016  . Leukocytosis 03/02/2016  . Cerebrovascular accident (CVA) due to thrombosis of precerebral artery ([place]) 03/01/2016  . 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 Atherosclerosis of native arteries of the extremities with gangrene ([place]) 12/08/2015  . Open toe wound 05/04/2014  . Anemia, chronic disease 04/27/2014  . Occult blood positive stool 04/17/2014  . Edema 04/03/2014  . Unspecified hereditary and idiopathic peripheral neuropathy 03/14/2014  . DM2 (diabetes mellitus, type 2) ([place]) 02/14/2014  . Altered mental status 02/12/2014  . Acute encephalopathy 02/12/2014  . Aortic stenosis 02/04/2014  . Closed left subtrochanteric femur fracture ([place]) 02/03/2014  . Hip fracture, left ([place]) 02/03/2014  . Hypokalemia 02/03/2014  . Fibula fracture 02/03/2014  . MTP instability 02/03/2014  . Hip  fracture ([place]) 02/03/2014      Prior to Admission medications   Medication Sig Start Date End Date Taking? Authorizing Provider  acetaminophen (TYLENOL) 500 MG tablet Take 1,000 mg by mouth every 6 (six) hours as needed for mild pain.    [provider]  amLODipine (NORVASC) 10 MG tablet Take 10 mg by mouth daily.    [provider]  aspirin EC 81 MG EC tablet Take 1 tablet (81 mg total) by mouth daily. 04/29/19   Stegmayer, Joelene Millin A, PA-C  atorvastatin (LIPITOR) 20 MG tablet Take 1 tablet (20 mg total) by mouth daily. Patient taking differently: Take 20 mg by mouth at bedtime.  03/13/16   Virgina Jock A, PA-C  budesonide (PULMICORT) 0.25 MG/2ML nebulizer solution Take 2 mLs (0.25 mg total) by nebulization 2 (two)  times daily. 06/10/18   Dustin Flock, MD  carvedilol (COREG) 6.25 MG tablet Take 6.25 mg by mouth 2 (two) times daily with a meal.    [provider]  cetirizine (ZYRTEC) 10 MG tablet Take 10 mg by mouth daily.     [provider]  Cholecalciferol (VITAMIN D) 50 MCG (2000 UT) CAPS Take 2,000 Units by mouth daily.     [provider]  clopidogrel (PLAVIX) 75 MG tablet Take 1 tablet (75 mg total) by mouth daily with breakfast. 03/04/17   Conrad [place], MD  docusate sodium (COLACE) 100 MG capsule Take 100 mg by mouth 2 (two) times daily.    [provider]  fluticasone (FLONASE) 50 MCG/ACT nasal spray Place 1 spray into both nostrils daily as needed for allergies.     [provider]  gabapentin (NEURONTIN) 300 MG capsule Take 300 mg by mouth every evening.  12/07/18   [provider]  hydrALAZINE (APRESOLINE) 25 MG tablet Take 1 tablet (25 mg total) by mouth every 8 (eight) hours. Patient taking differently: Take 25 mg by mouth 3 (three) times daily.  12/28/18   Fritzi Mandes, MD  insulin glargine (LANTUS) 100 UNIT/ML injection Inject 0.15 mLs (15 Units total) into the skin at bedtime. Patient taking differently: Inject 18 Units into the skin at bedtime.  12/28/18   Fritzi Mandes, MD  magnesium oxide (MAG-OX) 400 MG tablet Take 400 mg by mouth 2 (two) times daily.     [provider]  modafinil (PROVIGIL) 200 MG tablet Take 200 mg by mouth daily.    [provider]  omeprazole (PRILOSEC) 20 MG capsule Take 1 capsule (20 mg total) by mouth daily. 04/17/18   Henreitta Leber, MD  oxyCODONE-acetaminophen (PERCOCET/ROXICET) 5-325 MG tablet Take 1 tablet by mouth every 6 (six) hours as needed for moderate pain or severe pain. 04/08/20   Nolberto Hanlon, MD  polyethylene glycol (MIRALAX / GLYCOLAX) packet Take 17 g by mouth 2 (two) times daily. Patient taking differently: Take 17 g by mouth daily as needed for moderate constipation.  02/09/14   Thurnell Lose, MD  potassium chloride SA (K-DUR,KLOR-CON) 20 MEQ tablet Take 20 mEq by mouth daily.    [provider]  torsemide (DEMADEX) 20 MG tablet Take 1 tablet (20 mg total) by mouth 2 (two) times daily. 12/28/18   Fritzi Mandes, MD  Vitamin E 180 MG CAPS Take 180 mg by mouth daily.    [provider]    Allergies Iodine, Penicillins, Shellfish allergy, Sulfa antibiotics, Sulfacetamide sodium, Sulfasalazine, Eggs or egg-derived products, and Morphine and related    Social History Social History  Tobacco Use  . Smoking status: Never Smoker  . Smokeless tobacco: Never Used  . Tobacco comment: Never smoked  Substance Use Topics  . Alcohol use: No    Alcohol/week: 0.0 standard drinks  . Drug use: No    Review of Systems Patient denies headaches, rhinorrhea, blurry vision, numbness, shortness of breath, chest pain, edema, cough, abdominal pain, nausea, vomiting, diarrhea, dysuria, fevers, rashes or hallucinations unless otherwise stated above in HPI. ____________________________________________   PHYSICAL EXAM:  VITAL SIGNS: Vitals:   10/19/20 1201 10/19/20 1414  BP: (!) 150/57 (!) 186/58  Pulse: 72 66  Resp: 17 17  Temp: 98.3 F (36.8 C)   SpO2: 98% 100%    Constitutional: Alert and oriented.  Eyes: Conjunctivae are normal.  Head: Atraumatic. Nose: No congestion/rhinnorhea. Mouth/Throat: Mucous membranes are moist.   Neck: No stridor. Painless ROM.  Cardiovascular: Normal rate, regular rhythm. Grossly normal heart sounds.  Good peripheral circulation. Respiratory: Normal respiratory effort.  No retractions. Lungs CTAB. Gastrointestinal: Soft and nontender. No distention. No abdominal bruits. No CVA tenderness. Genitourinary:  Musculoskeletal: Gangrenous changes to the right foot beyond the mid foot.  No purulence but what appear to be necrotic changes of the skin.  Foot is warm with some soft tissue swelling.  Unable to palpate DP or PT pulses.   Unable to obtain DP or PT signals.  No joint effusions. Neurologic:  Normal speech and language. No gross focal neurologic deficits are appreciated. No facial droop Skin:  Skin is warm, dry and intact. No rash noted. Psychiatric: Mood and affect are normal. Speech and behavior are normal.  ____________________________________________   LABS (all labs ordered are listed, but only abnormal results are displayed)  Results for orders placed or performed during the hospital encounter of 10/19/20 (from the past 24 hour(s))  Lactic acid, plasma     Status: None   Collection Time: 10/19/20 12:04 PM  Result Value Ref Range   Lactic Acid, Venous 1.7 0.5 - 1.9 mmol/L  Comprehensive metabolic panel     Status: Abnormal   Collection Time: 10/19/20 12:04 PM  Result Value Ref Range   Sodium 137 135 - 145 mmol/L   Potassium 4.0 3.5 - 5.1 mmol/L   Chloride 103 98 - 111 mmol/L   CO2 25 22 - 32 mmol/L   Glucose, Bld 237 (H) 70 - 99 mg/dL   BUN 21 8 - 23 mg/dL   Creatinine, Ser 0.60 0.44 - 1.00 mg/dL   Calcium 9.1 8.9 - 10.3 mg/dL   Total Protein 6.0 (L) 6.5 - 8.1 g/dL   Albumin 3.3 (L) 3.5 - 5.0 g/dL   AST 30 15 - 41 U/L   ALT 36 0 - 44 U/L   Alkaline Phosphatase 57 38 - 126 U/L   Total Bilirubin 0.8 0.3 - 1.2 mg/dL   GFR, Estimated >60 >60 mL/min   Anion gap 9 5 - 15  CBC with Differential     Status: Abnormal   Collection Time: 10/19/20 12:04 PM  Result Value Ref Range   WBC 12.8 (H) 4.0 - 10.5 K/uL   RBC 4.50 3.87 - 5.11 MIL/uL   Hemoglobin 12.1 12.0 - 15.0 g/dL   HCT 38.3 36.0 - 46.0 %   MCV 85.1 80.0 - 100.0 fL   MCH 26.9 26.0 - 34.0 pg   MCHC 31.6 30.0 - 36.0 g/dL   RDW 14.6 11.5 - 15.5 %   Platelets 240 150 - 400 K/uL   nRBC 0.0 0.0 -  0.2 %   Neutrophils Relative % 68 %   Neutro Abs 8.9 (H) 1.7 - 7.7 K/uL   Lymphocytes Relative 20 %   Lymphs Abs 2.5 0.7 - 4.0 K/uL   Monocytes Relative 7 %   Monocytes Absolute 0.8 0.1 - 1.0 K/uL   Eosinophils Relative 3 %   Eosinophils  Absolute 0.3 0.0 - 0.5 K/uL   Basophils Relative 1 %   Basophils Absolute 0.1 0.0 - 0.1 K/uL   Immature Granulocytes 1 %   Abs Immature Granulocytes 0.06 0.00 - 0.07 K/uL   ____________________________________________  EKG My review and personal interpretation at Time: 15:16   Indication: pad  Rate: 70  Rhythm: sinus Axis: normal Other: normal intervals, no stemi, nonspecific st abn ____________________________________________  RADIOLOGY  I personally reviewed all radiographic images ordered to evaluate for the above acute complaints and reviewed radiology reports and findings.  These findings were personally discussed with the patient.  Please see medical record for radiology report.  ____________________________________________   PROCEDURES  Procedure(s) performed:  Procedures    Critical Care performed: no ____________________________________________   INITIAL IMPRESSION / ASSESSMENT AND PLAN / ED COURSE  Pertinent labs & imaging results that were available during my care of the patient were reviewed by me and considered in my medical decision making (see chart for details).   DDX: Claudication, gangrene, cellulitis, fracture  KAYCE CHISMAR is a 80 y.o. who presents to the ED with presentation as described above.  Patient with exam as described above suggestive of gangrenous changes of the foot with very weak distal perfusion.  Her leg is warm no fever but mild leukocytosis and cellulitic changes of the foot.  Will order head prior to starting heparin as she does have confusion and want to rule out sdh or mass.  Will order IV abx.  Husband at bedside agreeable to plan..     The patient was evaluated in Emergency Department today for the symptoms described in the history of present illness. He/she was evaluated in the context of the global COVID-19 pandemic, which necessitated consideration that the patient might be at risk for infection with the SARS-CoV-2 virus that  causes COVID-19. Institutional protocols and algorithms that pertain to the evaluation of patients at risk for COVID-19 are in a state of rapid change based on information released by regulatory bodies including the CDC and federal and state organizations. These policies and algorithms were followed during the patient's care in the ED.  As part of my medical decision making, I reviewed the following data within the [place] notes reviewed and incorporated, Labs reviewed, notes from prior ED visits and [place] Controlled Substance Database   ____________________________________________   FINAL CLINICAL IMPRESSION(S) / ED DIAGNOSES  Final diagnoses:  Gangrene of right foot ([place])      NEW MEDICATIONS STARTED DURING THIS VISIT:  New Prescriptions   No medications on file     Note:  This document was prepared using Dragon voice recognition software and may include unintentional dictation errors.    Merlyn Lot, MD 10/19/20 1525

## 2020-10-19 NOTE — Therapy (Signed)
[place] MAIN [facility] SERVICES [address] [place], [place], [address] Phone: [phone]   Fax:  [phone]  Physical Therapy Evaluation - Arrived No Charge  Patient Details  Name: [name] MRN: [id number] Date of Birth: 1941/03/25 Referring Provider (PT): [name]   Encounter Date: 10/19/2020   PT End of Session - 10/19/20 1954    Visit Number 1   arrived - no charge   Number of Visits 1    PT Start Time 1059    PT Stop Time 1153    PT Time Calculation (min) 54 min    Activity Tolerance Treatment limited secondary to medical complications (Comment)    Behavior During Therapy --   Alert but not oriented to situation or time.          Past Medical History:  Diagnosis Date  . Acute heart failure ([place])   . Aortic stenosis   . CHF (congestive heart failure) ([place])   . Complication of anesthesia    Hard to wake patient up after having anesthesia  . Diabetes mellitus without complication ([place])   . Diabetic neuropathy (HCC)    Takes Lyrica  . Edema of both legs    Takes Lasix  . GERD (gastroesophageal reflux disease)   . High cholesterol   . HTN (hypertension)   . Hypertension   . PAD (peripheral artery disease) ([place])   . Shortness of breath dyspnea   . Spasm of back muscles   . Stroke ([place])   . Wound, open    Right great toe    Past Surgical History:  Procedure Laterality Date  . AMPUTATION TOE Left 06/09/2019   Procedure: Left third toe and partial great toe amputation and debridement;  Surgeon: Katha Cabal, MD;  Location: [facility];  Service: Vascular;  Laterality: Left;  . AMPUTATION TOE Left 04/06/2020   Procedure: AMPUTATION LEFT SECOND TOE;  Surgeon: Samara Deist, DPM;  Location: [facility];  Service: Podiatry;  Laterality: Left;  . BYPASS GRAFT POPLITEAL TO TIBIAL Right 02/28/2016   Procedure: BYPASS GRAFT RIGHT BELOW KNEE POPLITEAL TO PERONEAL USING REVERSED RIGHT GREATER SAPHENOUS VEIN;  Surgeon: Conrad Surrey, MD;  Location: [place];  Service: Vascular;  Laterality: Right;  . CYST EXCISION     Abdomen  . EYE SURGERY Bilateral    Cataract removal  . INTRAMEDULLARY (IM) NAIL INTERTROCHANTERIC Left 02/04/2014   Procedure: INTRAMEDULLARY (IM) NAIL INTERTROCHANTRIC FEMORAL;  Surgeon: Mauri Pole, MD;  Location: [place];  Service: Orthopedics;  Laterality: Left;  . IR GENERIC HISTORICAL  03/01/2016   IR ANGIO INTRA EXTRACRAN SEL COM CAROTID INNOMINATE UNI R MOD SED 03/01/2016 [name], MD [facility]  . IR GENERIC HISTORICAL  03/01/2016   IR ENDOVASC INTRACRANIAL INF OTHER THAN THROMBO ART INC DIAG ANGIO 03/01/2016 [name], MD [facility]  . IR GENERIC HISTORICAL  03/01/2016   IR INTRAVSC STENT CERV CAROTID W/O EMB-PROT MOD SED INC ANGIO 03/01/2016 [name], MD [facility]  . IR GENERIC HISTORICAL  06/03/2016   IR RADIOLOGIST EVAL & MGMT 06/03/2016 [facility]  . LOWER EXTREMITY ANGIOGRAPHY Left 02/09/2019   Procedure: LOWER EXTREMITY ANGIOGRAPHY;  Surgeon: Katha Cabal, MD;  Location: [place] [facility];  Service: Cardiovascular;  Laterality: Left;  . LOWER EXTREMITY ANGIOGRAPHY Left 03/10/2019   Procedure: LOWER EXTREMITY ANGIOGRAPHY;  Surgeon: Katha Cabal, MD;  Location: [place] [facility];  Service: Cardiovascular;  Laterality: Left;  .  LOWER EXTREMITY ANGIOGRAPHY Left 04/27/2019   Procedure: LOWER EXTREMITY ANGIOGRAPHY;  Surgeon: Katha Cabal, MD;  Location: [place] [facility];  Service: Cardiovascular;  Laterality: Left;  . LOWER EXTREMITY ANGIOGRAPHY Left 06/08/2019   Procedure: Lower Extremity Angiography;  Surgeon: Katha Cabal, MD;  Location: [place] [facility];  Service: Cardiovascular;  Laterality: Left;  . LOWER EXTREMITY ANGIOGRAPHY Left 04/03/2020   Procedure: Lower Extremity Angiography;  Surgeon: Algernon Huxley, MD;  Location: [place] [facility];  Service: Cardiovascular;  Laterality: Left;  . ORIF TOE FRACTURE Right 02/08/2014    Procedure: OPEN REDUCTION INTERNAL FIXATION Right METATARSAL  FRACTURE ;  Surgeon: Wylene Simmer, MD;  Location: [place];  Service: Orthopedics;  Laterality: Right;  . PERIPHERAL VASCULAR CATHETERIZATION N/A 12/28/2015   Procedure: Abdominal Aortogram w/Lower Extremity;  Surgeon: Conrad Corinne, MD;  Location: [place] [facility];  Service: Cardiovascular;  Laterality: N/A;  . RADIOLOGY WITH ANESTHESIA N/A 03/01/2016   Procedure: RADIOLOGY WITH ANESTHESIA;  Surgeon: [name], MD;  Location: [place];  Service: Radiology;  Laterality: N/A;  . VEIN HARVEST Right 02/28/2016   Procedure: RIGHT GREATER SAPHENOUS VEIN HARVEST;  Surgeon: Conrad [place], MD;  Location: [place];  Service: Vascular;  Laterality: Right;    There were no vitals filed for this visit.    Subjective Assessment - 10/19/20 1056    Subjective Pt is a poor historian at baseline per spouse report. Pt alert but only oriented to name and place. Not to situation or time thinking it is 1920. Pt reporting she wants to try to walk.    Patient is accompained by: Family member    Pertinent History Pt is a 79 y.o. female with a PMH of: diastolic congestive heart failure, diabetes mellitus, diabetic neuropathy, hypertension, hyperlipidemia, GERD, peripheral vascular disease, CVA. on 9/2 had amputation of L foot second toe MTP joint and LLE revascularization. Per MD note on 9/23, pt does not ambulate. Husband with spouse and is the historian due to pt being a poor historian. He reports pt was not compliant with HH PT and so she was discharged and he requested a referral to OPPT due to beliefe pt will be better compliant. Pt and husband do have a caregiver that comes 4 hours 3x/week. Husband states pt believes she is getting better because he used to have to physically roll pt to help with bed mobility. Reports pt now utilizes 85% and pt able to do 15%. Upon further discussion with spouse, pt is dependent for all mobility, transfers, toileting, and ADL  needs even requiring maxA from spouse to sit EOB without back support. No toileting equipment at pt's disposal besides disposable briefs that pt can use due to deconditioned state she is in now. Husband cleans wife daily after soiling briefs for BM and urination. 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 Spouse states he has requested home nursing to assist his needs but has not been contacted back in ~3 weeks about services. Pt has not ambulated in 3 years and wishes to ambulate.    Limitations Lifting;Standing;Walking;House hold activities;Sitting    How long can you sit comfortably? Unlimited    How long can you stand comfortably? Unable    How long can you walk comfortably? Unable    Patient Stated Goals To walk    Currently in Pain? No/denies    Multiple Pain Sites No  [facility] PT Assessment - 10/19/20 1121      Balance Screen   Has the patient fallen in the past 6 months No    Has the patient had a decrease in activity level because of a fear of falling?  Yes    Is the patient reluctant to leave their home because of a fear of falling?  No      Home Social worker Private residence    Living Arrangements Spouse/significant other    Available Help at Discharge Family;Personal care attendant   4 hours/ 3x/week   Type of [place] One level    [place] - 2 wheels;Wheelchair - manual;Hospital bed;Toilet riser   [employer] fast streamer     Prior Function   Level of Independence Needs assistance with transfers;Needs assistance with homemaking;Needs assistance with ADLs             Unable to assess objective measures due to findings of what appears R foot necrosis/gangrene. Refer to clinical impression statement for details. PT assessed vitals and escorted pt and spouse to ED for further work up.   BP: 134/49 mm Hg  HR: 73 BPM  SPO2: 98%  Objective  measurements completed on examination: See above findings.     PT Education - 10/19/20 1953    Education Details Pressure relief, need to seek ED services due to what appears necrotic and gangrenous R foot.    Person(s) Educated Patient;Spouse    Methods Explanation    Comprehension Verbalized understanding             Plan - 10/19/20 1956    Clinical Impression Statement Pt is a 80 y.o. female referred to PT for deconditioning. PMH includes: diastolic congestive heart failure, diabetes mellitus, diabetic neuropathy, hypertension, hyperlipidemia, GERD, peripheral vascular disease, CVA. On 9/2 had amputation of second toe MTP joint on L Foot and LLE revascularization.  Pt with spouse today and he is primary historian. Pt alert but not oriented to time (believes it is 1920). Oriented to name and place. Able to report birthday however. After subjective reporting from spouse, pt is dependent for all functional mobility. Patient minimally assists spouse with rolling for cleaning (after toileting) and for sponge baths with a history of chronic sacral/buttock wounds per spouse, putting pt at significant risk of infection. Per husband reports, the pt does not seem to have the resources that are adequate for the patient care levels at home with husband's assistance. They are only receiving nursing aid not from an actual RN, (maybe CNA care?) but have requested home nursing for more intensive medical care due to these issues. After these reports, previous PMH, and recent L MTP amputation, PT assessed pt's feet. After removal of socks, R foot appeared necrotic and gangrenous. Digits 1-5 all black in appearance with swelling in dorsum of foot with discoloration of brown, black, green and red compared to L foot. R foot swollen but non painful to pt. Sensation to light touch in tact on plantar surface of foot. Pt unable to report feeling on dorsum with inability to palpate pedal pulse. Spouse unable to give  reliable history of chronicity of this issue but reports her right foot looks worse now than it has in the past. Due to this finding, PT ended evaluation , and educated on need for pt to report to ED for further assessment. Spouse in agreement and verbalizing understanding. Due to  inability to perform any objective measures, no STG/LTG set. Patient will need resume PT orders and goals to be updated upon patient's return to outpatient PT Services.    Personal Factors and Comorbidities Age;Time since onset of injury/illness/exacerbation;Comorbidity 3+;Past/Current Experience;Education;Social Background;Transportation    Comorbidities diastolic congestive heart failure, diabetes mellitus, diabetic neuropathy, hypertension, hyperlipidemia, GERD, peripheral vascular disease, CVA. on 9/2 had amputation of L foot second toe MTP joint and LLE revascularization.    Examination-Activity Limitations Bathing;Hygiene/Grooming;Squat;Bed Mobility;Stairs;Lift;Bend;Locomotion Level;Stand;Caring for [employer];Toileting;Carry;Self Feeding;Transfers;Continence;Dressing;Other    Examination-Participation Restrictions Cleaning;Shop;Community Activity;Meal Prep;Driving;Other    Stability/Clinical Decision Making Unstable/Unpredictable    Clinical Decision Making High    Rehab Potential Poor    PT Frequency One time visit    PT Treatment/Interventions ADLs/Self Care Home Management;DME Instruction;Functional mobility training;Therapeutic activities;Therapeutic exercise;Neuromuscular re-education;Patient/family education;Wheelchair mobility training;Passive range of motion;Energy conservation    PT Next Visit Plan Will need new order for re-evaluatoin    Recommended Other Services Pt brought to ED for assessment of R foot    Consulted and Agree with Plan of Care Patient;Family member/caregiver    Family Member Consulted Spouse           Patient will benefit from skilled therapeutic intervention in order to  improve the following deficits and impairments:  Decreased cognition,Decreased skin integrity,Impaired sensation,Decreased mobility,Postural dysfunction,Decreased activity tolerance,Decreased endurance,Decreased range of motion,Decreased strength,Impaired perceived functional ability,Decreased balance,Decreased safety awareness,Increased edema,Impaired flexibility,Obesity  Visit Diagnosis: No diagnosis found.     Problem List Patient Active Problem List   Diagnosis Date Noted  . Cellulitis 10/19/2020  . Necrotic toes ([place])   . Insulin dependent diabetes mellitus type IA ([place])   . Diabetic neuropathy ([place])   . Infected wound 03/30/2020  . Palliative care encounter   . Gangrene of toe of both feet ([place]) 06/06/2019  . Pressure injury of skin 06/04/2019  . Recurrent UTI 06/02/2019  . UTI (urinary tract infection) 06/02/2019  . PAD (peripheral artery disease) ([place]) 04/27/2019  . ESBL (extended spectrum beta-lactamase) producing bacteria infection 12/25/2018  . Healthcare-associated pneumonia 06/03/2018  . Acute cystitis 04/10/2018  . Sepsis due to Streptococcus, group B ([place]) 09/12/2016  . Multiple open wounds of lower leg 09/12/2016  . PVD (peripheral vascular disease) ([place]) 09/12/2016  . Bacteremia 08/31/2016  . Ulcer of right lower extremity ([place]) 08/29/2016  . Sepsis ([place]) 08/29/2016  . Acute on chronic diastolic heart failure ([place]) 07/08/2016  . HTN (hypertension) 07/05/2016  . Chest pain 07/05/2016  . Impacted cerumen of both ears 06/05/2016  . 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 Chronic diastolic CHF (congestive heart failure) ([place]) 03/10/2016  . HLD (hyperlipidemia) 03/09/2016  . Pleural effusion, bilateral 03/09/2016  . Wheeze 03/09/2016  . Dyspnea 03/08/2016  . Right sided weakness 03/05/2016  . Acute respiratory acidosis 03/05/2016  . Surgery, elective 03/04/2016  . Anemia, iron deficiency 03/03/2016  . Leukocytosis 03/02/2016  .  Cerebrovascular accident (CVA) due to thrombosis of precerebral artery ([place]) 03/01/2016  . 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 Atherosclerosis of native arteries of the extremities with gangrene ([place]) 12/08/2015  . Open toe wound 05/04/2014  . Anemia, chronic disease 04/27/2014  . Occult blood positive stool 04/17/2014  . Edema 04/03/2014  . Unspecified hereditary and idiopathic peripheral neuropathy 03/14/2014  . DM2 (diabetes mellitus, type 2) ([place]) 02/14/2014  . Altered mental status 02/12/2014  . Acute encephalopathy 02/12/2014  . Aortic stenosis 02/04/2014  . Closed left subtrochanteric femur fracture ([place]) 02/03/2014  .  Hip fracture, left ([place]) 02/03/2014  . Hypokalemia 02/03/2014  . Fibula fracture 02/03/2014  . MTP instability 02/03/2014  . Hip fracture ([place]) 02/03/2014    Salem Caster. Fairly IV, PT, DPT Physical Therapist- [facility]  10/19/2020, 8:47 PM  [place] MAIN [facility] SERVICES [address] [place], [place], [address] Phone: [phone]   Fax:  [phone]  Name: [name] MRN: [id number] Date of Birth: 1940-10-02

## 2020-10-19 NOTE — H&P (Addendum)
History and Physical  VOLANDA MANGINE INO:[id number] DOB: [birth year] DOA: 10/19/2020  Referring physician: Dr. Tamala Julian, [place].  PCP: [name], MD  Outpatient Specialists: Vascular surgery. Patient coming from: Home.  Chief Complaint: Discoloration of right foot.  HPI: KENI WAFER is a 80 y.o. female with medical history significant for peripheral artery disease on aspirin/Plavix and Lipitor, type 2 diabetes, essential hypertension, hyperlipidemia, chronic diastolic CHF, who presented to [facility] ED due to worsening discoloration in her right foot affecting all 5 digits.  Her husband noticed today some redness in her R foot dorsal region.  Patient denies having any pain.  No recent trauma or recent antibiotics.  States she has been taking her medications as prescribed.  They presented to the ED for further evaluation.  X-ray of the right foot showed soft tissue swelling.  Foci of arterial vascular calcification noted.  No R dorsalis pedis pulse felt on palpation.  Due to concern for cellulitis with associated peripheral vascular disease patient was started on IV antibiotics empirically and IV heparin.  Vascular surgery Dr Delana Meyer consulted by EDP who recommended to continue IV antibiotics and IV heparin.  ED Course:  Afebrile, BP 102/66, pulse 72, respiratory rate 17, O2 saturation 100% on room air.  Lab studies remarkable for elevated serum glucose 237, BBC 12.8K, Urine analysis positive for pyuria.  Review of Systems: Review of systems as noted in the HPI. All other systems reviewed and are negative.   Past Medical History:  Diagnosis Date   Acute heart failure (HCC)    Aortic stenosis    CHF (congestive heart failure) (HCC)    Complication of anesthesia    Hard to wake patient up after having anesthesia   Diabetes mellitus without complication ([place])    Diabetic neuropathy ([place])    Takes Lyrica   Edema of both legs    Takes Lasix   GERD (gastroesophageal reflux disease)    High  cholesterol    HTN (hypertension)    Hypertension    PAD (peripheral artery disease) (HCC)    Shortness of breath dyspnea    Spasm of back muscles    Stroke (HCC)    Wound, open    Right great toe   Past Surgical History:  Procedure Laterality Date   AMPUTATION TOE Left 06/09/2019   Procedure: Left third toe and partial great toe amputation and debridement;  Surgeon: Katha Cabal, MD;  Location: [facility];  Service: Vascular;  Laterality: Left;   AMPUTATION TOE Left 04/06/2020   Procedure: AMPUTATION LEFT SECOND TOE;  Surgeon: Samara Deist, DPM;  Location: [facility];  Service: Podiatry;  Laterality: Left;   BYPASS GRAFT POPLITEAL TO TIBIAL Right 02/28/2016   Procedure: BYPASS GRAFT RIGHT BELOW KNEE POPLITEAL TO PERONEAL USING REVERSED RIGHT GREATER SAPHENOUS VEIN;  Surgeon: Conrad , MD;  Location: [place];  Service: Vascular;  Laterality: Right;   CYST EXCISION     Abdomen   EYE SURGERY Bilateral    Cataract removal   INTRAMEDULLARY (IM) NAIL INTERTROCHANTERIC Left 02/04/2014   Procedure: INTRAMEDULLARY (IM) NAIL INTERTROCHANTRIC FEMORAL;  Surgeon: Mauri Pole, MD;  Location: [place];  Service: Orthopedics;  Laterality: Left;   IR GENERIC HISTORICAL  03/01/2016   IR ANGIO INTRA EXTRACRAN SEL COM CAROTID INNOMINATE UNI R MOD SED 03/01/2016 [name], MD [facility]   IR GENERIC HISTORICAL  03/01/2016   IR ENDOVASC INTRACRANIAL INF OTHER THAN THROMBO ART INC DIAG ANGIO 03/01/2016 [name], MD [facility]  IR GENERIC HISTORICAL  03/01/2016   IR INTRAVSC STENT CERV CAROTID W/O EMB-PROT MOD SED INC ANGIO 03/01/2016 [name], MD [facility]   IR GENERIC HISTORICAL  06/03/2016   IR RADIOLOGIST EVAL & MGMT 06/03/2016 [facility]   LOWER EXTREMITY ANGIOGRAPHY Left 02/09/2019   Procedure: LOWER EXTREMITY ANGIOGRAPHY;  Surgeon: Katha Cabal, MD;  Location: [place] [facility];  Service: Cardiovascular;  Laterality: Left;   LOWER  EXTREMITY ANGIOGRAPHY Left 03/10/2019   Procedure: LOWER EXTREMITY ANGIOGRAPHY;  Surgeon: Katha Cabal, MD;  Location: [place] [facility];  Service: Cardiovascular;  Laterality: Left;   LOWER EXTREMITY ANGIOGRAPHY Left 04/27/2019   Procedure: LOWER EXTREMITY ANGIOGRAPHY;  Surgeon: Katha Cabal, MD;  Location: [place] [facility];  Service: Cardiovascular;  Laterality: Left;   LOWER EXTREMITY ANGIOGRAPHY Left 06/08/2019   Procedure: Lower Extremity Angiography;  Surgeon: Katha Cabal, MD;  Location: [place] [facility];  Service: Cardiovascular;  Laterality: Left;   LOWER EXTREMITY ANGIOGRAPHY Left 04/03/2020   Procedure: Lower Extremity Angiography;  Surgeon: Algernon Huxley, MD;  Location: [place] [facility];  Service: Cardiovascular;  Laterality: Left;   ORIF TOE FRACTURE Right 02/08/2014   Procedure: OPEN REDUCTION INTERNAL FIXATION Right METATARSAL  FRACTURE ;  Surgeon: Wylene Simmer, MD;  Location: [place];  Service: Orthopedics;  Laterality: Right;   PERIPHERAL VASCULAR CATHETERIZATION N/A 12/28/2015   Procedure: Abdominal Aortogram w/Lower Extremity;  Surgeon: Conrad Santee, MD;  Location: [place] [facility];  Service: Cardiovascular;  Laterality: N/A;   RADIOLOGY WITH ANESTHESIA N/A 03/01/2016   Procedure: RADIOLOGY WITH ANESTHESIA;  Surgeon: [name], MD;  Location: [place];  Service: Radiology;  Laterality: N/A;   VEIN HARVEST Right 02/28/2016   Procedure: RIGHT GREATER SAPHENOUS VEIN HARVEST;  Surgeon: Conrad , MD;  Location: [place];  Service: Vascular;  Laterality: Right;    Social History:  reports that she has never smoked. She has never used smokeless tobacco. She reports that she does not drink alcohol and does not use drugs.   Allergies  Allergen Reactions   Iodine Anaphylaxis   Penicillins Anaphylaxis    Tolerates ceftriaxone, cefazolin Did it involve swelling of the face/tongue/throat, SOB, or low BP? Unknown Did it involve sudden or severe  rash/hives, skin peeling, or any reaction on the inside of your mouth or nose? Unknown Did you need to seek medical attention at a hospital or doctor's office? Unknown When did it last happen?Unknown If all above answers are NO, may proceed with cephalosporin use.    Shellfish Allergy Anaphylaxis   Sulfa Antibiotics Anaphylaxis   Sulfacetamide Sodium Anaphylaxis   Sulfasalazine Anaphylaxis   Eggs Or Egg-Derived Products     Other reaction(s): SHORTNESS OF BREATH   Morphine And Related Other (See Comments)    Altered mental status    Family History  Problem Relation Age of Onset   Diabetes Other    Liver disease Mother    CVA Father    Diabetes Father    Hypertension Father       Prior to Admission medications   Medication Sig Start Date End Date Taking? Authorizing Provider  acetaminophen (TYLENOL) 500 MG tablet Take 1,000 mg by mouth every 6 (six) hours as needed for mild pain.    [provider]  amLODipine (NORVASC) 10 MG tablet Take 10 mg by mouth daily.    [provider]  aspirin EC 81 MG EC tablet Take 1 tablet (81 mg total) by  mouth daily. 04/29/19   Stegmayer, Joelene Millin A, PA-C  atorvastatin (LIPITOR) 20 MG tablet Take 1 tablet (20 mg total) by mouth daily. Patient taking differently: Take 20 mg by mouth at bedtime.  03/13/16   Virgina Jock A, PA-C  budesonide (PULMICORT) 0.25 MG/2ML nebulizer solution Take 2 mLs (0.25 mg total) by nebulization 2 (two) times daily. 06/10/18   Dustin Flock, MD  carvedilol (COREG) 6.25 MG tablet Take 6.25 mg by mouth 2 (two) times daily with a meal.    [provider]  cetirizine (ZYRTEC) 10 MG tablet Take 10 mg by mouth daily.     [provider]  Cholecalciferol (VITAMIN D) 50 MCG (2000 UT) CAPS Take 2,000 Units by mouth daily.     [provider]  clopidogrel (PLAVIX) 75 MG tablet Take 1 tablet (75 mg total) by mouth daily with breakfast. 03/04/17   Conrad Owosso, MD   docusate sodium (COLACE) 100 MG capsule Take 100 mg by mouth 2 (two) times daily.    [provider]  fluticasone (FLONASE) 50 MCG/ACT nasal spray Place 1 spray into both nostrils daily as needed for allergies.     [provider]  gabapentin (NEURONTIN) 300 MG capsule Take 300 mg by mouth every evening.  12/07/18   [provider]  hydrALAZINE (APRESOLINE) 25 MG tablet Take 1 tablet (25 mg total) by mouth every 8 (eight) hours. Patient taking differently: Take 25 mg by mouth 3 (three) times daily.  12/28/18   Fritzi Mandes, MD  insulin glargine (LANTUS) 100 UNIT/ML injection Inject 0.15 mLs (15 Units total) into the skin at bedtime. Patient taking differently: Inject 18 Units into the skin at bedtime.  12/28/18   Fritzi Mandes, MD  magnesium oxide (MAG-OX) 400 MG tablet Take 400 mg by mouth 2 (two) times daily.     [provider]  modafinil (PROVIGIL) 200 MG tablet Take 200 mg by mouth daily.    [provider]  omeprazole (PRILOSEC) 20 MG capsule Take 1 capsule (20 mg total) by mouth daily. 04/17/18   Henreitta Leber, MD  oxyCODONE-acetaminophen (PERCOCET/ROXICET) 5-325 MG tablet Take 1 tablet by mouth every 6 (six) hours as needed for moderate pain or severe pain. 04/08/20   Nolberto Hanlon, MD  polyethylene glycol (MIRALAX / GLYCOLAX) packet Take 17 g by mouth 2 (two) times daily. Patient taking differently: Take 17 g by mouth daily as needed for moderate constipation.  02/09/14   Thurnell Lose, MD  potassium chloride SA (K-DUR,KLOR-CON) 20 MEQ tablet Take 20 mEq by mouth daily.    [provider]  torsemide (DEMADEX) 20 MG tablet Take 1 tablet (20 mg total) by mouth 2 (two) times daily. 12/28/18   Fritzi Mandes, MD  Vitamin E 180 MG CAPS Take 180 mg by mouth daily.    [provider]    Physical Exam: BP (!) 194/66    Pulse 72    Temp 98.3 F (36.8 C) (Oral)    Resp 17    Ht 5\' 8"  (1.727 m)    Wt 66.2 kg    SpO2 100%    BMI 22.19 kg/m     General: 80 y.o. year-old female well developed well nourished in no acute distress.  Alert and interactive.  Very hard of hearing.  Cardiovascular: Regular rate and rhythm with no rubs or gallops.  No thyromegaly or JVD noted.  Difficult to palpate dorsalis pedis pulses bilaterally.  Respiratory: Clear to auscultation with no wheezes  or rales.  Poor inspiratory effort.  Abdomen: Soft nontender nondistended with normal bowel sounds x4 quadrants.  Muskuloskeletal: Trace edema in lower extremities bilaterally.  Neuro: CN II-XII intact, strength, sensation, reflexes  Skin:   R foot   Psychiatry: Judgement and insight appear normal. Mood is appropriate for condition and setting.          Labs on Admission:  Basic Metabolic Panel: Recent Labs  Lab 10/19/20 1204  NA 137  K 4.0  CL 103  CO2 25  GLUCOSE 237*  BUN 21  CREATININE 0.60  CALCIUM 9.1   Liver Function Tests: Recent Labs  Lab 10/19/20 1204  AST 30  ALT 36  ALKPHOS 57  BILITOT 0.8  PROT 6.0*  ALBUMIN 3.3*   No results for input(s): LIPASE, AMYLASE in the last 168 hours. No results for input(s): AMMONIA in the last 168 hours. CBC: Recent Labs  Lab 10/19/20 1204  WBC 12.8*  NEUTROABS 8.9*  HGB 12.1  HCT 38.3  MCV 85.1  PLT 240   Cardiac Enzymes: No results for input(s): CKTOTAL, CKMB, CKMBINDEX, TROPONINI in the last 168 hours.  BNP (last 3 results) Recent Labs    04/01/20 0615  BNP 135.5*    ProBNP (last 3 results) No results for input(s): PROBNP in the last 8760 hours.  CBG: No results for input(s): GLUCAP in the last 168 hours.  Radiological Exams on Admission: CT Head Wo Contrast  Result Date: 10/19/2020 CLINICAL DATA:  Mental status change. Right foot discoloration. No reported injury. EXAM: CT HEAD WITHOUT CONTRAST TECHNIQUE: Contiguous axial images were obtained from the base of the skull through the vertex without intravenous contrast. COMPARISON:  06/04/2019 head CT.  FINDINGS: Brain: Generalized cerebral volume loss. Stable mild right parietal encephalomalacia. Stable calcified extra-axial 1.1 x 1.1 cm mass in the inferior right posterior fossa (series 3/image 4), compatible with meningioma. No evidence of parenchymal hemorrhage or extra-axial fluid collection. No midline shift. No CT evidence of acute infarction. Nonspecific moderate subcortical and periventricular white matter hypodensity, most in keeping with chronic small vessel ischemic change. Cerebral ventricle sizes are stable and concordant with the degree of cerebral volume loss. Vascular: No acute abnormality. Skull: No evidence of calvarial fracture. Sinuses/Orbits: The visualized paranasal sinuses are essentially clear. Other:  The mastoid air cells are unopacified. IMPRESSION: 1. No evidence of acute intracranial abnormality. 2. Generalized cerebral volume loss and moderate chronic small vessel ischemic changes in the cerebral white matter. 3. Chronic mild right parietal encephalomalacia. 4. 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 Electronically Signed   By: Ilona Sorrel M.D.   On: 10/19/2020 16:00   DG Foot Complete Right  Result Date: 10/19/2020 CLINICAL DATA:  Pain and discoloration EXAM: RIGHT FOOT COMPLETE - 3+ VIEW COMPARISON:  April 10, 2018. FINDINGS: Frontal, oblique, and lateral views were obtained. There is soft tissue swelling. Bones are markedly osteoporotic. No appreciable acute fracture or dislocation. There is fusion of the first MTP joint. Other joint spaces appear unremarkable. No erosive change or bony destruction. There are multiple foci of arterial vascular calcification. IMPRESSION: Soft tissue swelling. Marked osteoporosis. No acute fracture or dislocation. No erosive change or bony destruction. Fusion first MTP joint, stable. Foci of arterial vascular calcification noted. Electronically Signed   By: Lowella Grip III M.D.   On: 10/19/2020 14:37     EKG: I independently viewed the EKG done and my findings are as followed: Sinus rhythm rate of 72, nonspecific ST-T changes.  QTc 428.  Assessment/Plan Present on Admission:  Cellulitis  Active Problems:   Cellulitis  Right dorsal foot cellulitis Presented with worsening discoloration of the right dorsal foot Unclear how long this has been present as the patient and her husband are poor historian. X-ray in the ED of right foot showed soft tissue swelling with no evidence of osteomyelitis. Started on IV vancomycin due to allergy to penicillin. Follow blood cultures x2 peripherally and MRSA screen test. Monitor fever curve and WBC. Continue IV fluid lactated Ringer at 50 cc/h x 2 days.  Presumptive UTI, poa Urine culture positive for pyuria Follow urine culture for ID and sensitivities. History of ESBL E. coli on 06/02/2019. Add meropenem.  Chronic peripheral artery disease with ? Acute ischemia Continue home aspirin, Plavix and Lipitor. Vascular surgery consulted by EDP. Vascular surgery recommended IV heparin and IV abx ABI ordered then canceled, per ultrasound tech, the study could not be completed. Management per vascular surgery.   Type 2 diabetes with hyperglycemia Obtain hemoglobin A1c Last hemoglobin A1c 10.2 on 03/30/2020 Start insulin sliding scale.  Essential hypertension BP is not at goal Resume home oral antihypertensives Add IV antihypertensives as needed with parameters. Continue to monitor vital signs  COPD/hyperlipidemia/GERD Resume home regimen.  Generalized weakness/physical debility PT OT to assess Fall precautions   DVT prophylaxis: Heparin drip.  Code Status: Full code as stated by the patient and her husband at bedside.  Family Communication: Husband at bedside.  Disposition Plan: Admit to MedSurg with remote telemetry.  Consults called: Vascular surgery consulted by EDP.  Admission status: Inpatient status.  Patient will require  at least 2 midnights for further evaluation and treatment of present condition.   Status is: Inpatient    Dispo:  Patient From: Home  Planned Disposition: Home  Anticipated discharge date 10/22/2020 or when vascular surgery signs off.  Medically stable for discharge: No, ongoing management of cellulitis and presumptive UTI.         Kayleen Memos MD [facility] Pager [phone]  If 7PM-7AM, please contact night-coverage [email or web address] Password [facility]  10/19/2020, 4:42 PM

## 2020-10-20 ENCOUNTER — Encounter
Admission: EM | Disposition: A | Discharge status: Home Health | Payer: Self-pay | Source: Home / Self Care | Attending: Internal Medicine

## 2020-10-20 ENCOUNTER — Encounter: Payer: Self-pay | Admitting: Anesthesiology

## 2020-10-20 ENCOUNTER — Other Ambulatory Visit (INDEPENDENT_AMBULATORY_CARE_PROVIDER_SITE_OTHER): Payer: Self-pay | Admitting: Vascular Surgery

## 2020-10-20 DIAGNOSIS — R319 Hematuria, unspecified: Secondary | ICD-10-CM

## 2020-10-20 DIAGNOSIS — R5381 Other malaise: Secondary | ICD-10-CM

## 2020-10-20 DIAGNOSIS — I70261 Atherosclerosis of native arteries of extremities with gangrene, right leg: Secondary | ICD-10-CM

## 2020-10-20 DIAGNOSIS — K219 Gastro-esophageal reflux disease without esophagitis: Secondary | ICD-10-CM

## 2020-10-20 DIAGNOSIS — I739 Peripheral vascular disease, unspecified: Secondary | ICD-10-CM

## 2020-10-20 DIAGNOSIS — G9341 Metabolic encephalopathy: Secondary | ICD-10-CM

## 2020-10-20 DIAGNOSIS — J449 Chronic obstructive pulmonary disease, unspecified: Secondary | ICD-10-CM

## 2020-10-20 DIAGNOSIS — I96 Gangrene, not elsewhere classified: Secondary | ICD-10-CM

## 2020-10-20 DIAGNOSIS — L0390 Cellulitis, unspecified: Secondary | ICD-10-CM

## 2020-10-20 HISTORY — PX: LOWER EXTREMITY ANGIOGRAPHY: CATH118251

## 2020-10-20 LAB — BASIC METABOLIC PANEL
Anion gap: 6 (ref 5–15)
BUN: 18 mg/dL (ref 8–23)
CO2: 26 mmol/L (ref 22–32)
Calcium: 9.2 mg/dL (ref 8.9–10.3)
Chloride: 106 mmol/L (ref 98–111)
Creatinine, Ser: 0.46 mg/dL (ref 0.44–1.00)
GFR, Estimated: >60 mL/min (ref >60)
Glucose, Bld: 152 mg/dL — ABNORMAL HIGH (ref 70–99)
Potassium: 3.5 mmol/L (ref 3.5–5.1)
Sodium: 138 mmol/L (ref 135–145)

## 2020-10-20 LAB — CBG MONITORING, ED
Glucose-Capillary: 114 mg/dL — ABNORMAL HIGH (ref 70–99)
Glucose-Capillary: 113 mg/dL — ABNORMAL HIGH (ref 70–99)

## 2020-10-20 LAB — CBC
HCT: 35.2 % — ABNORMAL LOW (ref 36.0–46.0)
Hemoglobin: 11.1 g/dL — ABNORMAL LOW (ref 12.0–15.0)
MCH: 26.9 pg (ref 26.0–34.0)
MCHC: 31.5 g/dL (ref 30.0–36.0)
MCV: 85.2 fL (ref 80.0–100.0)
Platelets: 208 10*3/uL (ref 150–400)
RBC: 4.13 MIL/uL (ref 3.87–5.11)
RDW: 14.6 % (ref 11.5–15.5)
WBC: 10.3 10*3/uL (ref 4.0–10.5)
nRBC: 0 % (ref 0.0–0.2)

## 2020-10-20 LAB — GLUCOSE, CAPILLARY
Glucose-Capillary: 128 mg/dL — ABNORMAL HIGH (ref 70–99)
Glucose-Capillary: 143 mg/dL — ABNORMAL HIGH (ref 70–99)
Glucose-Capillary: 124 mg/dL — ABNORMAL HIGH (ref 70–99)

## 2020-10-20 LAB — HEPARIN LEVEL (UNFRACTIONATED)
Heparin Unfractionated: 0.23 IU/mL — ABNORMAL LOW (ref 0.30–0.70)
Heparin Unfractionated: 0.32 IU/mL (ref 0.30–0.70)
Heparin Unfractionated: 0.65 IU/mL (ref 0.30–0.70)

## 2020-10-20 LAB — MAGNESIUM: Magnesium: 2.4 mg/dL (ref 1.7–2.4)

## 2020-10-20 LAB — MRSA PCR SCREENING: MRSA by PCR: NEGATIVE

## 2020-10-20 SURGERY — LOWER EXTREMITY ANGIOGRAPHY
Anesthesia: Moderate Sedation | Laterality: Right

## 2020-10-20 MED ORDER — SODIUM CHLORIDE 0.9% FLUSH: 3.0000 mL | INTRAVENOUS | Status: DC | PRN

## 2020-10-20 MED ORDER — MIDAZOLAM HCL 2 MG/2ML IJ SOLN
INTRAMUSCULAR | Status: DC | PRN
  Administered 2020-10-20: 1 mg via INTRAVENOUS
  Administered 2020-10-20: 0.5 mg via INTRAVENOUS

## 2020-10-20 MED ORDER — FENTANYL CITRATE (PF) 100 MCG/2ML IJ SOLN
INTRAMUSCULAR | Status: DC | PRN
  Administered 2020-10-20 (×2): 25 ug via INTRAVENOUS

## 2020-10-20 MED ORDER — HEPARIN SODIUM (PORCINE) 1000 UNIT/ML IJ SOLN
INTRAMUSCULAR | Status: AC
  Filled 2020-10-20: qty 1

## 2020-10-20 MED ORDER — SODIUM CHLORIDE 0.9 % IV SOLN: INTRAVENOUS | Status: DC

## 2020-10-20 MED ORDER — MIDAZOLAM HCL 2 MG/2ML IJ SOLN
INTRAMUSCULAR | Status: AC
  Filled 2020-10-20: qty 2

## 2020-10-20 MED ORDER — DIPHENHYDRAMINE HCL 50 MG/ML IJ SOLN
50.0000 mg | Freq: Once | INTRAMUSCULAR | Status: AC | PRN
  Administered 2020-10-20: 50 mg via INTRAVENOUS

## 2020-10-20 MED ORDER — FENTANYL CITRATE (PF) 100 MCG/2ML IJ SOLN
INTRAMUSCULAR | Status: AC
  Filled 2020-10-20: qty 2

## 2020-10-20 MED ORDER — ONDANSETRON HCL 4 MG/2ML IJ SOLN: 4.0000 mg | Freq: Four times a day (QID) | INTRAMUSCULAR | Status: DC | PRN

## 2020-10-20 MED ORDER — ACETAMINOPHEN 325 MG PO TABS
650.0000 mg | ORAL_TABLET | ORAL | Status: DC | PRN
  Administered 2020-10-22: 650 mg via ORAL

## 2020-10-20 MED ORDER — LABETALOL HCL 5 MG/ML IV SOLN
INTRAVENOUS | Status: AC
  Filled 2020-10-20: qty 4

## 2020-10-20 MED ORDER — HYDRALAZINE HCL 20 MG/ML IJ SOLN
INTRAMUSCULAR | Status: DC | PRN
  Administered 2020-10-20: 10 mg via INTRAVENOUS

## 2020-10-20 MED ORDER — LABETALOL HCL 5 MG/ML IV SOLN
INTRAVENOUS | Status: DC | PRN
  Administered 2020-10-20 (×2): 10 mg via INTRAVENOUS

## 2020-10-20 MED ORDER — DIPHENHYDRAMINE HCL 50 MG/ML IJ SOLN
INTRAMUSCULAR | Status: AC
  Filled 2020-10-20: qty 1

## 2020-10-20 MED ORDER — HEPARIN (PORCINE) 25000 UT/250ML-% IV SOLN
800.0000 [IU]/h | INTRAVENOUS | Status: DC
  Administered 2020-10-20: 950 [IU]/h via INTRAVENOUS
  Filled 2020-10-20: qty 250

## 2020-10-20 MED ORDER — FAMOTIDINE 20 MG PO TABS: 40.0000 mg | ORAL_TABLET | Freq: Once | ORAL | Status: AC | PRN

## 2020-10-20 MED ORDER — METHYLPREDNISOLONE SODIUM SUCC 125 MG IJ SOLR
INTRAMUSCULAR | Status: AC
  Administered 2020-10-20: 125 mg via INTRAVENOUS
  Filled 2020-10-20: qty 2

## 2020-10-20 MED ORDER — FAMOTIDINE 20 MG PO TABS
ORAL_TABLET | ORAL | Status: AC
  Administered 2020-10-20: 40 mg via ORAL
  Filled 2020-10-20: qty 2

## 2020-10-20 MED ORDER — SODIUM CHLORIDE 0.9% FLUSH
3.0000 mL | Freq: Two times a day (BID) | INTRAVENOUS | Status: DC
  Administered 2020-10-21 – 2020-10-23 (×4): 3 mL via INTRAVENOUS

## 2020-10-20 MED ORDER — METHYLPREDNISOLONE SODIUM SUCC 125 MG IJ SOLR: 125.0000 mg | Freq: Once | INTRAMUSCULAR | Status: AC | PRN

## 2020-10-20 MED ORDER — MIDAZOLAM HCL 2 MG/ML PO SYRP: 8.0000 mg | ORAL_SOLUTION | Freq: Once | ORAL | Status: DC | PRN

## 2020-10-20 MED ORDER — SODIUM CHLORIDE 0.9 % IV SOLN
2.0000 g | INTRAVENOUS | Status: DC
  Administered 2020-10-21 – 2020-10-23 (×3): 2 g via INTRAVENOUS
  Filled 2020-10-20 (×3): qty 2

## 2020-10-20 MED ORDER — SODIUM CHLORIDE 0.9 % IV SOLN
250.0000 mL | INTRAVENOUS | Status: DC | PRN
  Administered 2020-10-22: 250 mL via INTRAVENOUS

## 2020-10-20 MED ORDER — HEPARIN BOLUS VIA INFUSION
1000.0000 [IU] | Freq: Once | INTRAVENOUS | Status: AC
  Administered 2020-10-20: 1000 [IU] via INTRAVENOUS
  Filled 2020-10-20: qty 1000

## 2020-10-20 MED ORDER — HYDRALAZINE HCL 20 MG/ML IJ SOLN
INTRAMUSCULAR | Status: AC
  Filled 2020-10-20: qty 1

## 2020-10-20 MED ORDER — SODIUM CHLORIDE 0.9 % IV SOLN: INTRAVENOUS | Status: AC

## 2020-10-20 SURGICAL SUPPLY — 32 items
BALLN LUTONIX 018 4X150X130 (BALLOONS) ×2
BALLN LUTONIX 018 4X220X130 (BALLOONS) ×2
BALLN ULTRVRSE 3X300X150 (BALLOONS) ×1
BALLN ULTRVRSE 3X300X150 OTW (BALLOONS) ×1
BALLN UTLRAVERSE 2X60X150 (BALLOONS) ×2
BALLOON LUTONIX 018 4X150X130 (BALLOONS) IMPLANT
BALLOON LUTONIX 018 4X220X130 (BALLOONS) IMPLANT
BALLOON ULTRVRSE 3X300X150 OTW (BALLOONS) IMPLANT
BALLOON UTLRAVERSE 2X60X150 (BALLOONS) IMPLANT
CANNULA 5F STIFF (CANNULA) ×1 IMPLANT
CATH ANGIO 5F PIGTAIL 65CM (CATHETERS) ×1 IMPLANT
CATH MICROCATH PRGRT 2.8F 130 (MICROCATHETER) IMPLANT
COVER PROBE U/S 5X48 (MISCELLANEOUS) ×1 IMPLANT
DEVICE STARCLOSE SE CLOSURE (Vascular Products) ×1 IMPLANT
DEVICE TORQUE (MISCELLANEOUS) ×1 IMPLANT
DEVICE TORQUE .014-.018 (MISCELLANEOUS) IMPLANT
GLIDECATH ANGLED 4FR 120CM (CATHETERS) ×1 IMPLANT
GLIDEWIRE ADV .014X300CM (WIRE) ×1 IMPLANT
GLIDEWIRE ADV .035X260CM (WIRE) ×1 IMPLANT
GLIDEWIRE ANGLED SS 035X260CM (WIRE) ×1 IMPLANT
GUIDEWIRE PFTE-COATED .018X300 (WIRE) ×1 IMPLANT
KIT ENCORE 26 ADVANTAGE (KITS) ×1 IMPLANT
MICROCATH PROGREAT 2.8F 130CM (MICROCATHETER) ×2
PACK ANGIOGRAPHY (CUSTOM PROCEDURE TRAY) ×2 IMPLANT
SHEATH BRITE TIP 5FRX11 (SHEATH) ×1 IMPLANT
SHEATH RAABE 6FRX70 (SHEATH) ×1 IMPLANT
STENT LIFESTENT 5F 5X120X135 (Permanent Stent) ×1 IMPLANT
SYR MEDRAD MARK 7 150ML (SYRINGE) ×1 IMPLANT
TORQUE DEVICE .014-.018 (MISCELLANEOUS) ×2
TUBING CONTRAST HIGH PRESS 72 (TUBING) ×1 IMPLANT
WIRE G V18X300CM (WIRE) ×1 IMPLANT
WIRE GUIDERIGHT .035X150 (WIRE) ×2 IMPLANT

## 2020-10-20 NOTE — Op Note (Signed)
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 Introduction catheter into right lower extremity 3rd order catheter placement  2. Contrast injection right lower extremity for distal runoff  3. Percutaneous transluminal angioplasty and stent placement right superficial femoral and popliteal arteries to 4 mm 4. Percutaneous transluminal angioplasty right anterior tibial to 3 mm  5. Star close closure left common femoral arteriotomy  Anesthesia: Conscious sedation was administered under my direct supervision by the interventional radiology RN. IV Versed plus fentanyl were utilized. Continuous ECG, pulse oximetry and blood pressure was monitored throughout the entire procedure.  Conscious sedation was for a total of 72 minutes.  Sheath: 70 cm 6 French Raby sheath left common femoral retrograde  Contrast: 85 cc  Fluoroscopy Time: 15.6 minutes  Indications: [name] presents with increasing pain of the right lower extremity.  She has gangrenous changes to the forefoot.  This suggests the patient is having limb threatening ischemia. The risks and benefits are reviewed all questions answered patient's family agrees to proceed with angiography and intervention with hope for limb salvage.  Procedure:[name] is a 80 y.o. y.o. female who was identified and appropriate procedural time out was performed. The patient was then placed supine on the table and prepped and draped in the usual sterile fashion.   Ultrasound was placed in the sterile sleeve and the left groin was evaluated the left common femoral artery was echolucent and pulsatile  indicating patency. Image was recorded for the permanent record and under real-time visualization a microneedle was inserted into the common femoral artery followed by the microwire and then the micro-sheath. A J-wire was then advanced through the micro-sheath and a 5 [place] sheath was then inserted over a J-wire. J-wire was then advanced and a 5 French pigtail catheter was positioned at the level of T12.  AP projection of the aorta was then obtained. Pigtail catheter was repositioned to above the bifurcation and a LAO view of the pelvis was obtained. Subsequently a pigtail catheter with the stiff angle Glidewire was used to cross the aortic bifurcation the catheter wire were advanced down into the right distal external iliac artery. Oblique view of the femoral bifurcation was then obtained and subsequently the wire was reintroduced and the pigtail catheter negotiated into the SFA representing third order catheter placement. Distal runoff was then performed.  Diagnostic interpretation: The abdominal aorta is opacified with a bolus injection contrast.  Has mild atherosclerotic changes there are no hemodynamically significant stenoses.  Single renal arteries are noted no evidence of hemodynamically significant ostial stenosis.  The aortic bifurcation is widely patent as is the bilateral common and external iliac arteries.  Internal iliac arteries are patent.  The right common femoral and profunda femoris are diffusely diseased but widely patent.  The superficial femoral artery is diffusely diseased proximally at its midportion it occludes just above Hunter's canal.  The popliteal artery is occluded in its entirety in the origin of the anterior tibial is occluded.  There is greater than 80% stenosis in the proximal 2 to 3 cm of the anterior tibial.  The tibioperoneal trunk peroneal and posterior tibial are occluded from their origins throughout their entire lengths.  The anterior tibial beyond the above-noted  stenoses is patent and appears to cross the ankle filling the pedal  arch.  5000 units of heparin was then given and allowed to circulate and a 70 cm 6 French Raby sheath was advanced up and over the bifurcation and positioned in the femoral artery  Angled glide catheter and advantage Glidewire were then negotiated down into the distal popliteal. Catheter was then advanced to a position that has reconstitution of a short segment of the anterior tibial and indeed with hand-injection of contrast we confirm that we have across the entire length of the distal SFA and popliteal occlusion and we are intraluminal.  Next using a variety of wires and catheters I was able to cross the 80% stenoses in the proximal anterior tibial ultimately a stiff angled Glidewire and the Kumpe catheter were successful.  AV 18 wire was then advanced down into the distal anterior tibial. Hand injection contrast demonstrated the anterior tibial anatomy in detail.  2 mm x 40 mm balloon was used to angioplasty the anterior tibial inflation was to 12 atm for 1 minute.  Next a 3 mm x 30 mm Ultraverse balloon was used to angioplasty the anterior tibial but extending up to recanalize the occlusion.  This inflation was to 10 atm for 1 minute.  Hand-injection contrast through the sheath now demonstrated the anterior tibial was well treated with less than 10% residual stenosis and there is now flow through the distal SFA and popliteal.  A 4 mm x 220 mm Lutonix drug-eluting balloon was then used to angioplasty from the distal popliteal proximally.  Inflation was to 10 atm for 1 minute.  Follow-up imaging demonstrated a segment of greater than 60%'s residual stenosis in the midportion.  Therefore I elected to place a 5 mm x 120 mm life stent across this stenosis essentially beginning at Hunter's canal and extending down to the level of femoral condyles.  This was postdilated with a 4 mm x 150 mm Lutonix drug-eluting balloon inflated to 8 atm for 1  minute.  Follow-up imaging demonstrated less than 5% residual stenosis throughout the entire SFA and popliteal with preservation of the anterior tibial runoff.  After review of these images the sheath is pulled into the left external iliac oblique of the common femoral is obtained and a Star close device deployed. There no immediate Complications.  Findings:  The abdominal aorta is opacified with a bolus injection contrast.  Has mild atherosclerotic changes there are no hemodynamically significant stenoses.  Single renal arteries are noted no evidence of hemodynamically significant ostial stenosis.  The aortic bifurcation is widely patent as is the bilateral common and external iliac arteries.  Internal iliac arteries are patent.  The right common femoral and profunda femoris are diffusely diseased but widely patent.  The superficial femoral artery is diffusely diseased proximally at its midportion it occludes just above Hunter's canal.  The right popliteal artery is occluded in its entirety in the origin of the anterior tibial is occluded.  There is greater than 80% stenosis in the proximal 2 to 3 cm of the anterior tibial.  The right tibioperoneal trunk peroneal and posterior tibial are occluded from their origins throughout their entire lengths.  The right anterior tibial beyond the above-noted stenoses is patent and appears to cross the ankle filling the pedal arch.  Following angioplasty right anterior tibial now is in-line flow and looks quite nice with less than 5% residual stenosis. Angioplasty and stent placement of the right SFA at Hunter's canal extending through the distal popliteal yields an excellent result with less than 5% residual stenosis.  Summary: Successful recanalization right was lower extremity for limb salvage   Disposition: Patient was taken to the recovery room in stable condition having tolerated the procedure well.  Hortencia Pilar 10/20/2020,8:02  PM

## 2020-10-20 NOTE — Hospital Course (Addendum)
[name] is a 80 y.o. female with medical history significant for peripheral artery disease on aspirin/Plavix and Lipitor, type 2 diabetes, essential hypertension, hyperlipidemia, chronic diastolic CHF, who presented to [facility] ED due to worsening discoloration in her right foot affecting all 5 digits.  Her husband noticed some redness in her R foot dorsal region.  Patient denies having any pain.  No recent trauma or recent antibiotics.  States she has been taking her medications as prescribed.  They presented to the ED for further evaluation.  X-ray of the right foot showed soft tissue swelling.  Foci of arterial vascular calcification noted.  No R dorsalis pedis pulse felt on palpation.  Due to concern for cellulitis with associated peripheral vascular disease patient was started on IV antibiotics empirically and IV heparin.  Vascular surgery Dr Delana Meyer consulted by EDP who recommended to continue IV antibiotics and IV heparin. She was taken to the OR on 10/20/2020 and underwent angioplasty and stent placement to the right superficial femoral and popliteal arteries. Her heparin was discontinued and she was continued on aspirin, Plavix, statin.  She was considered stable for discharging home after further monitoring in the hospital and will follow up outpatient. Antibiotics were transitioned to clindamycin at discharge to complete total of 7-day course.

## 2020-10-20 NOTE — Progress Notes (Signed)
Initial Nutrition Assessment  DOCUMENTATION CODES:   Not applicable  INTERVENTION:   -RD will follow for diet advancement and add supplements as appropriate  NUTRITION DIAGNOSIS:   Increased nutrient needs related to wound healing as evidenced by estimated needs.  GOAL:   Patient will meet greater than or equal to 90% of their needs  MONITOR:   PO intake,Supplement acceptance,Diet advancement,Labs,Weight trends,Skin,I & O's  REASON FOR ASSESSMENT:   Malnutrition Screening Tool    ASSESSMENT:   [name] is a 80 y.o. female with medical history significant for peripheral artery disease on aspirin/Plavix and Lipitor, type 2 diabetes, essential hypertension, hyperlipidemia, chronic diastolic CHF, who presented to [facility] ED due to worsening discoloration in her right foot affecting all 5 digits.  Pt admitted with rt foot dorsal cellulitis.   Reviewed I/O's: +969 ml x 24 hours  Pt unavailable at time of visit. Attempted to speak with pt via call to hospital room phone, however, no answer. Unable to obtain further nutrition-related history or complete nutrition-focused physical exam at this time.   Per MD notes, pt remains confused and unable to answer questions.   Reviewed wt hx; wt has been stable over the past 6 months.   Medications reviewed and include lactated ringers infusion @ 50 ml/hr.   Lab Results  Component Value Date   HGBA1C 9.0 (H) 10/19/2020   PTA DM medications are 15 units insulin glargine daily.   Labs reviewed: CBGS: 113-128 (inpatient orders for glycemic control are 0-15 units insulin aspart TID with meals and 0.5 units insulin aspart daily at bedtime).   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  Estimated Nutritional Needs:   Kcal:  2100-2300  Protein:  115-130 grams  Fluid:  > 2 L    Loistine Chance, RD, LDN, [place] Registered Dietitian II Certified Diabetes Care and Education Specialist Please refer to [facility] for RD and/or RD on-call/weekend/after hours pager

## 2020-10-20 NOTE — Consult Note (Signed)
[place] for Heparin Indication: limb ischemia, gangrene of toe  Allergies  Allergen Reactions  . Iodine Anaphylaxis  . Penicillins Anaphylaxis    Tolerates ceftriaxone, cefazolin Did it involve swelling of the face/tongue/throat, SOB, or low BP? Unknown Did it involve sudden or severe rash/hives, skin peeling, or any reaction on the inside of your mouth or nose? Unknown Did you need to seek medical attention at a hospital or doctor's office? Unknown When did it last happen?Unknown If all above answers are "NO", may proceed with cephalosporin use.   . Shellfish Allergy Anaphylaxis  . Sulfa Antibiotics Anaphylaxis  . Sulfacetamide Sodium Anaphylaxis  . Sulfasalazine Anaphylaxis  . Eggs Or Egg-Derived Products     Other reaction(s): SHORTNESS OF BREATH  . Morphine And Related Other (See Comments)    Altered mental status    Patient Measurements: Height: 5\' 8"  (172.7 cm) Weight: 66.2 kg (145 lb 15.1 oz) IBW/kg (Calculated) : 63.9 Heparin Dosing Weight: 66.2 kg  Vital Signs: Temp: 97.5 F (36.4 C) (03/18 2110) Temp Source: Oral (03/18 2110) BP: 153/61 (03/18 2110) Pulse Rate: 84 (03/18 2110)  Labs: Recent Labs    10/19/20 1204 10/19/20 1612 10/20/20 0010 10/20/20 0512 10/20/20 0744 10/20/20 1610  HGB 12.1  --   --  11.1*  --   --   HCT 38.3  --   --  35.2*  --   --   PLT 240  --   --  208  --   --   APTT  --  27  --   --   --   --   LABPROT  --  13.0  --   --   --   --   INR  --  1.0  --   --   --   --   HEPARINUNFRC  --   --  0.32  --  0.23* 0.65  CREATININE 0.60  --   --  0.46  --   --     Estimated Creatinine Clearance: 59.4 mL/min (by C-G formula based on SCr of 0.46 mg/dL).   Medical History: Past Medical History:  Diagnosis Date  . Acute heart failure ([place])   . Aortic stenosis   . CHF (congestive heart failure) ([place])   . Complication of anesthesia    Hard to wake patient up after having anesthesia  .  Diabetes mellitus without complication ([place])   . Diabetic neuropathy ([facility])    Takes Lyrica  . Edema of both legs    Takes Lasix  . GERD (gastroesophageal reflux disease)   . High cholesterol   . HTN (hypertension)   . Hypertension   . PAD (peripheral artery disease) ([place])   . Shortness of breath dyspnea   . Spasm of back muscles   . Stroke ([place])   . Wound, open    Right great toe    Medications:  No DOAC PTA.   Assessment: Pharmacy consulted to start heparin. CBC stable.   Pt started with 3000 units bolus x 1, followed by 800 units/hr  3/18  0010    HL 0.32 3/18  0744  HL 0.23 @ 800 units/hr 3/18 1610 HL 0.65 @ 950 units/hr  Patient went for angioplasty, Heparin to resume at 21:30  Goal of Therapy:  Heparin level 0.3-0.7 units/ml Monitor platelets by anticoagulation protocol: Yes   Plan:  Heparin to resume at previous rate of 950 units/hr at 21:30.  Check Heparin level in  8 hours per protocol  CBC's daily while on heparin drip  Paulina Fusi, PharmD, BCPS 10/20/2020 9:25 PM

## 2020-10-20 NOTE — Assessment & Plan Note (Addendum)
-   patient symptoms include AMS - etiology considered due to metabolic derangements however cannot rule out infectious cause as well -Continue fluids and antibiotics and monitor mentation response - some improvement appreciated on 3/19; husband bedside as well

## 2020-10-20 NOTE — Evaluation (Signed)
Occupational Therapy Evaluation Patient Details Name: [name] MRN: [id number] DOB: [birth year] Today's Date: 10/20/2020    History of Present Illness Pt is a 80 y/o F admitted on 10/19/20 with c/c of discoloration of R foot. Pt being tx for R dorsal foot cellulitis. PMH: PAD, DM2, essential HTN, HLD, chronic diastolic CHF, diabetic neuropathy, dyspnea, stroke   Clinical Impression   Ms Lovick was seen for OT/PT co-evaluation this date. Prior to hospital admission, pt was bed bound, using hoyer lift for w/c t/fs and husband provided Total care for ADLs. Husband reports pt has previously had [place] therapy services but she has refused participation. Pt currently requires TOTAL A for L+R rolling for bedpan use. Pt requesting to comb hair however when handed a comb at bed level pt refuses requesting others to brush it despite BUE AROM WFL. MAX A don B socks at bed level. Pt appears at functional baseline, will sign off. Upon hospital discharge, recommend HOT to maximize pt safety and return to functional independence during meaningful occupations of daily life.     Follow Up Recommendations  Home health OT;Supervision/Assistance - 24 hour    Equipment Recommendations  None recommended by OT (pt has all equipment needs)    Recommendations for Other Services       Precautions / Restrictions Precautions Precautions: Fall Restrictions Weight Bearing Restrictions: No      Mobility Bed Mobility Overal bed mobility: Needs Assistance Bed Mobility: Supine to Sit;Sit to Supine;Rolling Rolling: Max assist;+2 for physical assistance   Supine to sit: Total assist;+2 for physical assistance;HOB elevated Sit to supine: Total assist;+2 for physical assistance;HOB elevated   General bed mobility comments: per husband, pt is at baseline    Transfers                      Balance Overall balance assessment: Needs assistance Sitting-balance support: Feet supported;Bilateral upper extremity  supported Sitting balance-Leahy Scale: Zero Sitting balance - Comments: total assist +2 for static sitting EOB with PT blocking knees to prevent pt from sliding forward, no active engagement in keeping trunk upright so immediately returned to supine for safety                                   ADL either performed or assessed with clinical judgement   ADL Overall ADL's : Needs assistance/impaired                                       General ADL Comments: TOTAL A for L+R rolling for bedpan use. Pt requesting to comb hair however when handed a comb at bed level pt refuses requesting others to brush it despite BUE AROM WFL. MAX A don B socks at bed level.                  Pertinent Vitals/Pain Pain Assessment: Faces Faces Pain Scale: Hurts little more Pain Location: buttocks on bedpan Pain Descriptors / Indicators: Discomfort Pain Intervention(s): Limited activity within patient's tolerance     Hand Dominance     Extremity/Trunk Assessment Upper Extremity Assessment Upper Extremity Assessment: Generalized weakness   Lower Extremity Assessment Lower Extremity Assessment: Generalized weakness       Communication Communication Communication: No difficulties   Cognition Arousal/Alertness: Awake/alert Behavior During Therapy: Agitated Overall Cognitive Status: Difficult  to assess Area of Impairment: Orientation;Attention;Following commands;Memory;Safety/judgement;Awareness;Problem solving                 Orientation Level: Disoriented to;Place;Situation     Following Commands: Follows one step commands inconsistently;Follows one step commands with increased time Safety/Judgement: Decreased awareness of safety;Decreased awareness of deficits Awareness: Anticipatory;Emergent;Intellectual Problem Solving: Slow processing;Decreased initiation;Difficulty sequencing;Requires tactile cues;Requires verbal cues     General Comments        Exercises Exercises: Other exercises Other Exercises Other Exercises: Pt educated re: OT role, DME recs, d/c recs, falls prevention Other Exercises: LBD, sup<>sit, L+R rolling   Shoulder Instructions      Home Living Family/patient expects to be discharged to:: Private residence Living Arrangements: Spouse/significant other Available Help at Discharge: Family Type of Home: House Home Access: Ramped entrance     Home Layout: One level               Home Equipment: Wheelchair - manual;Hospital bed;Other (comment)   Additional Comments: Hoyer lift      Prior Functioning/Environment Level of Independence: Needs assistance        Comments: Per chart & pt's husband, pt has been non-ambulatory in hospital bed for the past 2-3 years. Uses hoyer lift to transfer to w/c but pt does not propel herself. Uses public transportation (w/c accessible) to get to appointments. Pt's husband provides total assist for peri hygiene & meal prep. Has an aide 4 hours/day, 3 days/week.        OT Problem List: Decreased safety awareness         OT Goals(Current goals can be found in the care plan section) Acute Rehab OT Goals Patient Stated Goal: to get to the wheelchair again, without the lift OT Goal Formulation: With patient/family Time For Goal Achievement: 11/03/20 Potential to Achieve Goals: Fair             Co-evaluation PT/OT/SLP Co-Evaluation/Treatment: Yes Reason for Co-Treatment: Complexity of the patient's impairments (multi-system involvement);For patient/therapist safety PT goals addressed during session: Mobility/safety with mobility OT goals addressed during session: ADL's and self-care      AM-PAC OT "6 Clicks" Daily Activity     Outcome Measure Help from another person eating meals?: A Little Help from another person taking care of personal grooming?: A Little Help from another person toileting, which includes using toliet, bedpan, or urinal?: Total Help from  another person bathing (including washing, rinsing, drying)?: Total Help from another person to put on and taking off regular upper body clothing?: A Lot Help from another person to put on and taking off regular lower body clothing?: Total 6 Click Score: 11   End of Session    Activity Tolerance: Other (comment) (Patient self-limiting) Patient left: in bed;with call bell/phone within reach;with bed alarm set;with family/visitor present  OT Visit Diagnosis: Unsteadiness on feet (R26.81);Other abnormalities of gait and mobility (R26.89)                Time: 2094-7096 OT Time Calculation (min): 25 min Charges:  OT General Charges $OT Visit: 1 Visit OT Evaluation $OT Eval Low Complexity: 1 Low   Dessie Coma, M.S. OTR/L  10/20/20, 4:51 PM  ascom [phone]

## 2020-10-20 NOTE — Consult Note (Signed)
[place] for Heparin Indication: limb ischemia, gangrene of toe  Allergies  Allergen Reactions  . Iodine Anaphylaxis  . Penicillins Anaphylaxis    Tolerates ceftriaxone, cefazolin Did it involve swelling of the face/tongue/throat, SOB, or low BP? Unknown Did it involve sudden or severe rash/hives, skin peeling, or any reaction on the inside of your mouth or nose? Unknown Did you need to seek medical attention at a hospital or doctor's office? Unknown When did it last happen?Unknown If all above answers are "NO", may proceed with cephalosporin use.   . Shellfish Allergy Anaphylaxis  . Sulfa Antibiotics Anaphylaxis  . Sulfacetamide Sodium Anaphylaxis  . Sulfasalazine Anaphylaxis  . Eggs Or Egg-Derived Products     Other reaction(s): SHORTNESS OF BREATH  . Morphine And Related Other (See Comments)    Altered mental status    Patient Measurements: Height: 5\' 8"  (172.7 cm) Weight: 66.2 kg (145 lb 15.1 oz) IBW/kg (Calculated) : 63.9 Heparin Dosing Weight: 66.2 kg  Vital Signs: Temp: 98.1 F (36.7 C) (03/18 1237) Temp Source: Oral (03/18 1237) BP: 155/69 (03/18 1537) Pulse Rate: 74 (03/18 1537)  Labs: Recent Labs    10/19/20 1204 10/19/20 1612 10/20/20 0010 10/20/20 0512 10/20/20 0744 10/20/20 1610  HGB 12.1  --   --  11.1*  --   --   HCT 38.3  --   --  35.2*  --   --   PLT 240  --   --  208  --   --   APTT  --  27  --   --   --   --   LABPROT  --  13.0  --   --   --   --   INR  --  1.0  --   --   --   --   HEPARINUNFRC  --   --  0.32  --  0.23* 0.65  CREATININE 0.60  --   --  0.46  --   --     Estimated Creatinine Clearance: 59.4 mL/min (by C-G formula based on SCr of 0.46 mg/dL).   Medical History: Past Medical History:  Diagnosis Date  . Acute heart failure ([place])   . Aortic stenosis   . CHF (congestive heart failure) ([place])   . Complication of anesthesia    Hard to wake patient up after having anesthesia  .  Diabetes mellitus without complication ([place])   . Diabetic neuropathy ([facility])    Takes Lyrica  . Edema of both legs    Takes Lasix  . GERD (gastroesophageal reflux disease)   . High cholesterol   . HTN (hypertension)   . Hypertension   . PAD (peripheral artery disease) ([place])   . Shortness of breath dyspnea   . Spasm of back muscles   . Stroke ([place])   . Wound, open    Right great toe    Medications:  No DOAC PTA.   Assessment: Pharmacy consulted to start heparin. CBC stable.   Pt started with 3000 units bolus x 1, followed by 800 units/hr  3/18  0010    HL 0.32 3/18  0744  HL 0.23 @ 800 units/hr 3/18 1610 HL 0.65 @ 950 units/hr  Goal of Therapy:  Heparin level 0.3-0.7 units/ml Monitor platelets by anticoagulation protocol: Yes   Plan:  Therapeutic x1. Will continue current rate of 950 units/hr  Draw level in 8 hours per protocol  CBC's daily while on heparin drip  [facility]  Nydia Bouton, PharmD Clinical Pharmacist 10/20/2020 5:00 PM

## 2020-10-20 NOTE — ED Notes (Signed)
Pt hard lab draw. Lab contacted to draw 0800 labs.

## 2020-10-20 NOTE — Assessment & Plan Note (Addendum)
Continue home aspirin, Plavix and Lipitor. Vascular surgery consulted by EDP. - patient now s/p RLE angioplasty with stent placement to SFA and popliteal artery - continue asa, plavix and statin per vascular - d/c heparin drip

## 2020-10-20 NOTE — Progress Notes (Signed)
PROGRESS NOTE    [name]   NGE:[id number]  DOB: 05/10/1941  DOA: 10/19/2020     1  PCP: [name], MD  CC: Altered mental status, right foot discoloration  Hospital Course: [name] is a 80 y.o. female with medical history significant for peripheral artery disease on aspirin/Plavix and Lipitor, type 2 diabetes, essential hypertension, hyperlipidemia, chronic diastolic CHF, who presented to [facility] ED due to worsening discoloration in her right foot affecting all 5 digits.  Her husband noticed some redness in her R foot dorsal region.  Patient denies having any pain.  No recent trauma or recent antibiotics.  States she has been taking her medications as prescribed.  They presented to the ED for further evaluation.  X-ray of the right foot showed soft tissue swelling.  Foci of arterial vascular calcification noted.  No R dorsalis pedis pulse felt on palpation.  Due to concern for cellulitis with associated peripheral vascular disease patient was started on IV antibiotics empirically and IV heparin.  Vascular surgery Dr Delana Meyer consulted by EDP who recommended to continue IV antibiotics and IV heparin.   Interval History:  Seen in the ER this morning.  Still confused and unable to answer any questions meaningfully.  No family present.  ROS: Review of systems not obtained due to patient factors.  Altered mental status  Assessment & Plan: Cellulitis Presented with worsening discoloration of the right dorsal foot X-ray in the ED of right foot showed soft tissue swelling with no evidence of osteomyelitis. Started on IV vancomycin due to allergy to penicillin. Follow blood cultures x2 peripherally and MRSA screen test. Monitor fever curve and WBC.  PAD (peripheral artery disease) (HCC) Continue home aspirin, Plavix and Lipitor. Vascular surgery consulted by EDP. Vascular surgery recommended IV heparin and IV abx ABI ordered then canceled, per ultrasound tech, the study could not  be completed. Management per vascular surgery.   Acute metabolic encephalopathy - patient symptoms include AMS - etiology considered due to metabolic derangements however cannot rule out infectious cause as well -Continue fluids and antibiotics and monitor mentation response  Acute cystitis -Unclear if truly symptomatic however patient does have encephalopathy -De-escalate meropenem down to Rocephin  DM2 (diabetes mellitus, type 2) (HCC) -Continue SSI and CBG monitoring  HTN (hypertension) -Continue blood pressure regimen   Old records reviewed in assessment of this patient  Antimicrobials: Vancomycin 10/19/2020>> current Meropenem 10/19/2020 >> 10/20/2020 Rocephin 10/20/2020>> current  DVT prophylaxis: Heparin drip   Code Status:   Code Status: Full Code Family Communication: Husband  Disposition Plan: Status is: Inpatient  Remains inpatient appropriate because:Ongoing diagnostic testing needed not appropriate for outpatient work up, IV treatments appropriate due to intensity of illness or inability to take PO and Inpatient level of care appropriate due to severity of illness   Dispo:  Patient From: Home  Planned Disposition: Home  Medically stable for discharge: No        Risk of unplanned readmission score: Unplanned Admission- Pilot do not use: 17.12   Objective: Blood pressure (!) 150/52, pulse 70, temperature 98.1 F (36.7 C), temperature source Oral, resp. rate 16, height 5\' 8"  (1.727 m), weight 66.2 kg, SpO2 99 %.  Examination: General appearance: Chronically ill-appearing elderly woman laying in bed overtly confused unable to answer questions but in no distress Head: Normocephalic, without obvious abnormality, atraumatic Eyes: EOMI Lungs: clear to auscultation bilaterally Heart: regular rate and rhythm and S1, S2 normal Abdomen: Soft, nontender, nondistended, bowel sounds present Extremities:  Right foot noted with dark discoloration on dorsum of foot.   First digit noted to be fixed and unable to passively move. Skin: warm, dry Neurologic: Awake but oriented to name only.  Moves all 4 extremities to command  Consultants:   Vascular surgery  Procedures:     Data Reviewed: I have personally reviewed following labs and imaging studies Results for orders placed or performed during the hospital encounter of 10/19/20 (from the past 24 hour(s))  Urinalysis, Complete w Microscopic     Status: Abnormal   Collection Time: 10/19/20  3:30 PM  Result Value Ref Range   Color, Urine YELLOW (A) YELLOW   APPearance CLOUDY (A) CLEAR   Specific Gravity, Urine 1.011 1.005 - 1.030   pH 7.0 5.0 - 8.0   Glucose, UA NEGATIVE NEGATIVE mg/dL   Hgb urine dipstick SMALL (A) NEGATIVE   Bilirubin Urine NEGATIVE NEGATIVE   Ketones, ur NEGATIVE NEGATIVE mg/dL   Protein, ur NEGATIVE NEGATIVE mg/dL   Nitrite POSITIVE (A) NEGATIVE   Leukocytes,Ua LARGE (A) NEGATIVE   RBC / HPF 21-50 0 - 5 RBC/hpf   WBC, UA >50 (H) 0 - 5 WBC/hpf   Bacteria, UA FEW (A) NONE SEEN   Squamous Epithelial / LPF NONE SEEN 0 - 5   WBC Clumps PRESENT   Resp Panel by RT-PCR (Flu A&B, Covid) Nasopharyngeal Swab     Status: None   Collection Time: 10/19/20  3:30 PM   Specimen: Nasopharyngeal Swab; Nasopharyngeal(NP) swabs in vial transport medium  Result Value Ref Range   SARS Coronavirus 2 by RT PCR NEGATIVE NEGATIVE   Influenza A by PCR NEGATIVE NEGATIVE   Influenza B by PCR NEGATIVE NEGATIVE  APTT     Status: None   Collection Time: 10/19/20  4:12 PM  Result Value Ref Range   aPTT 27 24 - 36 seconds  Protime-INR     Status: None   Collection Time: 10/19/20  4:12 PM  Result Value Ref Range   Prothrombin Time 13.0 11.4 - 15.2 seconds   INR 1.0 0.8 - 1.2  Lactic acid, plasma     Status: None   Collection Time: 10/19/20  6:39 PM  Result Value Ref Range   Lactic Acid, Venous 1.4 0.5 - 1.9 mmol/L  CULTURE, BLOOD (ROUTINE X 2) w Reflex to ID Panel     Status: None (Preliminary  result)   Collection Time: 10/19/20  6:39 PM   Specimen: BLOOD  Result Value Ref Range   Specimen Description BLOOD BLOOD LEFT HAND    Special Requests      BOTTLES DRAWN AEROBIC AND ANAEROBIC Blood Culture results may not be optimal due to an inadequate volume of blood received in culture bottles   Culture      NO GROWTH < 12 HOURS Performed at [facility], [address] [place], [place] [address]    Report Status PENDING   CULTURE, BLOOD (ROUTINE X 2) w Reflex to ID Panel     Status: None (Preliminary result)   Collection Time: 10/19/20  6:41 PM   Specimen: BLOOD  Result Value Ref Range   Specimen Description BLOOD BLOOD RIGHT HAND    Special Requests      BOTTLES DRAWN AEROBIC AND ANAEROBIC Blood Culture results may not be optimal due to an inadequate volume of blood received in culture bottles   Culture      NO GROWTH < 12 HOURS Performed at [facility], [place],  [place] [address]    Report Status PENDING   CBG monitoring, ED     Status: Abnormal   Collection Time: 10/19/20  6:56 PM  Result Value Ref Range   Glucose-Capillary 150 (H) 70 - 99 mg/dL  CBG monitoring, ED     Status: None   Collection Time: 10/19/20  9:23 PM  Result Value Ref Range   Glucose-Capillary 99 70 - 99 mg/dL  Heparin level (unfractionated)     Status: None   Collection Time: 10/20/20 12:10 AM  Result Value Ref Range   Heparin Unfractionated 0.32 0.30 - 0.70 IU/mL  MRSA PCR Screening     Status: None   Collection Time: 10/20/20  2:35 AM   Specimen: Nasopharyngeal  Result Value Ref Range   MRSA by PCR NEGATIVE NEGATIVE  CBC     Status: Abnormal   Collection Time: 10/20/20  5:12 AM  Result Value Ref Range   WBC 10.3 4.0 - 10.5 K/uL   RBC 4.13 3.87 - 5.11 MIL/uL   Hemoglobin 11.1 (L) 12.0 - 15.0 g/dL   HCT 35.2 (L) 36.0 - 46.0 %   MCV 85.2 80.0 - 100.0 fL   MCH 26.9 26.0 - 34.0 pg   MCHC 31.5 30.0 - 36.0 g/dL   RDW 14.6 11.5 - 15.5 %   Platelets 208 150  - 400 K/uL   nRBC 0.0 0.0 - 0.2 %  Basic metabolic panel     Status: Abnormal   Collection Time: 10/20/20  5:12 AM  Result Value Ref Range   Sodium 138 135 - 145 mmol/L   Potassium 3.5 3.5 - 5.1 mmol/L   Chloride 106 98 - 111 mmol/L   CO2 26 22 - 32 mmol/L   Glucose, Bld 152 (H) 70 - 99 mg/dL   BUN 18 8 - 23 mg/dL   Creatinine, Ser 0.46 0.44 - 1.00 mg/dL   Calcium 9.2 8.9 - 10.3 mg/dL   GFR, Estimated >60 >60 mL/min   Anion gap 6 5 - 15  Magnesium     Status: None   Collection Time: 10/20/20  5:12 AM  Result Value Ref Range   Magnesium 2.4 1.7 - 2.4 mg/dL  Heparin level (unfractionated)     Status: Abnormal   Collection Time: 10/20/20  7:44 AM  Result Value Ref Range   Heparin Unfractionated 0.23 (L) 0.30 - 0.70 IU/mL  CBG monitoring, ED     Status: Abnormal   Collection Time: 10/20/20  8:49 AM  Result Value Ref Range   Glucose-Capillary 114 (H) 70 - 99 mg/dL  CBG monitoring, ED     Status: Abnormal   Collection Time: 10/20/20 11:52 AM  Result Value Ref Range   Glucose-Capillary 113 (H) 70 - 99 mg/dL  Glucose, capillary     Status: Abnormal   Collection Time: 10/20/20 12:37 PM  Result Value Ref Range   Glucose-Capillary 128 (H) 70 - 99 mg/dL   Comment 1 Notify RN     Recent Results (from the past 240 hour(s))  Resp Panel by RT-PCR (Flu A&B, Covid) Nasopharyngeal Swab     Status: None   Collection Time: 10/19/20  3:30 PM   Specimen: Nasopharyngeal Swab; Nasopharyngeal(NP) swabs in vial transport medium  Result Value Ref Range Status   SARS Coronavirus 2 by RT PCR NEGATIVE NEGATIVE Final    Comment: (NOTE) SARS-CoV-2 target nucleic acids are NOT DETECTED.  The SARS-CoV-2 RNA is generally detectable in upper respiratory specimens during the acute phase of infection. The  lowest concentration of SARS-CoV-2 viral copies this assay can detect is 138 copies/mL. A negative result does not preclude SARS-Cov-2 infection and should not be used as the sole basis for treatment  or other patient management decisions. A negative result may occur with  improper specimen collection/handling, submission of specimen other than nasopharyngeal swab, presence of viral mutation(s) within the areas targeted by this assay, and inadequate number of viral copies(<138 copies/mL). A negative result must be combined with clinical observations, patient history, and epidemiological information. The expected result is Negative.  Fact Sheet for Patients:  EntrepreneurPulse.com.au  Fact Sheet for Healthcare Providers:  IncredibleEmployment.be  This test is no t yet approved or cleared by the [place] FDA and  has been authorized for detection and/or diagnosis of SARS-CoV-2 by FDA under an Emergency Use Authorization (EUA). This EUA will remain  in effect (meaning this test can be used) for the duration of the COVID-19 declaration under Section 564(b)(1) of the Act, 21 U.S.C.section 360bbb-3(b)(1), unless the authorization is terminated  or revoked sooner.       Influenza A by PCR NEGATIVE NEGATIVE Final   Influenza B by PCR NEGATIVE NEGATIVE Final    Comment: (NOTE) The Xpert Xpress SARS-CoV-2/FLU/RSV plus assay is intended as an aid in the diagnosis of influenza from Nasopharyngeal swab specimens and should not be used as a sole basis for treatment. Nasal washings and aspirates are unacceptable for Xpert Xpress SARS-CoV-2/FLU/RSV testing.  Fact Sheet for Patients: EntrepreneurPulse.com.au  Fact Sheet for Healthcare Providers: IncredibleEmployment.be  This test is not yet approved or cleared by the [place] FDA and has been authorized for detection and/or diagnosis of SARS-CoV-2 by FDA under an Emergency Use Authorization (EUA). This EUA will remain in effect (meaning this test can be used) for the duration of the COVID-19 declaration under Section 564(b)(1) of the Act, 21 U.S.C. section  360bbb-3(b)(1), unless the authorization is terminated or revoked.  Performed at [facility], [address] [place], [place] [address]   CULTURE, BLOOD (ROUTINE X 2) w Reflex to ID Panel     Status: None (Preliminary result)   Collection Time: 10/19/20  6:39 PM   Specimen: BLOOD  Result Value Ref Range Status   Specimen Description BLOOD BLOOD LEFT HAND  Final   Special Requests   Final    BOTTLES DRAWN AEROBIC AND ANAEROBIC Blood Culture results may not be optimal due to an inadequate volume of blood received in culture bottles   Culture   Final    NO GROWTH < 12 HOURS Performed at [facility], [address] [place], [place] [address]    Report Status PENDING  Incomplete  CULTURE, BLOOD (ROUTINE X 2) w Reflex to ID Panel     Status: None (Preliminary result)   Collection Time: 10/19/20  6:41 PM   Specimen: BLOOD  Result Value Ref Range Status   Specimen Description BLOOD BLOOD RIGHT HAND  Final   Special Requests   Final    BOTTLES DRAWN AEROBIC AND ANAEROBIC Blood Culture results may not be optimal due to an inadequate volume of blood received in culture bottles   Culture   Final    NO GROWTH < 12 HOURS Performed at [facility], [address] [place],  [address]    Report Status PENDING  Incomplete  MRSA PCR Screening     Status: None   Collection Time: 10/20/20  2:35 AM   Specimen: Nasopharyngeal  Result Value Ref Range Status  MRSA by PCR NEGATIVE NEGATIVE Final    Comment:        The GeneXpert MRSA Assay (FDA approved for NASAL specimens only), is one component of a comprehensive MRSA colonization surveillance program. It is not intended to diagnose MRSA infection nor to guide or monitor treatment for MRSA infections. Performed at [facility], [address], [place], [place] [address]      Radiology Studies: CT Head Wo Contrast  Result Date: 10/19/2020 CLINICAL DATA:  Mental status change. Right foot  discoloration. No reported injury. EXAM: CT HEAD WITHOUT CONTRAST TECHNIQUE: Contiguous axial images were obtained from the base of the skull through the vertex without intravenous contrast. COMPARISON:  06/04/2019 head CT. FINDINGS: Brain: Generalized cerebral volume loss. Stable mild right parietal encephalomalacia. Stable calcified extra-axial 1.1 x 1.1 cm mass in the inferior right posterior fossa (series 3/image 4), compatible with meningioma. No evidence of parenchymal hemorrhage or extra-axial fluid collection. No midline shift. No CT evidence of acute infarction. Nonspecific moderate subcortical and periventricular white matter hypodensity, most in keeping with chronic small vessel ischemic change. Cerebral ventricle sizes are stable and concordant with the degree of cerebral volume loss. Vascular: No acute abnormality. Skull: No evidence of calvarial fracture. Sinuses/Orbits: The visualized paranasal sinuses are essentially clear. Other:  The mastoid air cells are unopacified. IMPRESSION: 1. No evidence of acute intracranial abnormality. 2. Generalized cerebral volume loss and moderate chronic small vessel ischemic changes in the cerebral white matter. 3. Chronic mild right parietal encephalomalacia. 4. 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 Electronically Signed   By: Ilona Sorrel M.D.   On: 10/19/2020 16:00   DG Foot Complete Right  Result Date: 10/19/2020 CLINICAL DATA:  Pain and discoloration EXAM: RIGHT FOOT COMPLETE - 3+ VIEW COMPARISON:  April 10, 2018. FINDINGS: Frontal, oblique, and lateral views were obtained. There is soft tissue swelling. Bones are markedly osteoporotic. No appreciable acute fracture or dislocation. There is fusion of the first MTP joint. Other joint spaces appear unremarkable. No erosive change or bony destruction. There are multiple foci of arterial vascular calcification. IMPRESSION: Soft tissue swelling. Marked osteoporosis. No acute  fracture or dislocation. No erosive change or bony destruction. Fusion first MTP joint, stable. Foci of arterial vascular calcification noted. Electronically Signed   By: Lowella Grip III M.D.   On: 10/19/2020 14:37   CT Head Wo Contrast  Final Result    DG Foot Complete Right  Final Result      Scheduled Meds: . amLODipine  10 mg Oral Daily  . atorvastatin  20 mg Oral QHS  . budesonide  0.25 mg Nebulization BID  . carvedilol  6.25 mg Oral BID WC  . clopidogrel  75 mg Oral Q breakfast  . hydrALAZINE  25 mg Oral Q8H  . insulin aspart  0-15 Units Subcutaneous TID WC  . insulin aspart  0-5 Units Subcutaneous QHS  . pantoprazole  40 mg Oral Daily   PRN Meds: acetaminophen, hydrALAZINE, ondansetron (ZOFRAN) IV, oxyCODONE Continuous Infusions: . [START ON 10/21/2020] cefTRIAXone (ROCEPHIN)  IV    . heparin 950 Units/hr (10/20/20 0913)  . lactated ringers 50 mL/hr at 10/20/20 0651  . meropenem (MERREM) IV Stopped (10/20/20 0623)  . vancomycin       LOS: 1 day  Time spent: Greater than 50% of the 35 minute visit was spent in counseling/coordination of care for the patient as laid out in the A&P.   Dwyane Dee, MD [facility] 10/20/2020, 3:22  PM

## 2020-10-20 NOTE — Assessment & Plan Note (Addendum)
Presented with worsening discoloration of the right dorsal foot X-ray in the ED of right foot showed soft tissue swelling with no evidence of osteomyelitis. Started on IV vancomycin due to allergy to penicillin. Follow blood cultures x2 (remain negative) Monitor fever curve and WBC. -Discharged on clindamycin to complete total of 7-day course

## 2020-10-20 NOTE — Consult Note (Signed)
[place] for Heparin Indication: limb ischemia, gangrene of toe  Allergies  Allergen Reactions  . Iodine Anaphylaxis  . Penicillins Anaphylaxis    Tolerates ceftriaxone, cefazolin Did it involve swelling of the face/tongue/throat, SOB, or low BP? Unknown Did it involve sudden or severe rash/hives, skin peeling, or any reaction on the inside of your mouth or nose? Unknown Did you need to seek medical attention at a hospital or doctor's office? Unknown When did it last happen?Unknown If all above answers are "NO", may proceed with cephalosporin use.   . Shellfish Allergy Anaphylaxis  . Sulfa Antibiotics Anaphylaxis  . Sulfacetamide Sodium Anaphylaxis  . Sulfasalazine Anaphylaxis  . Eggs Or Egg-Derived Products     Other reaction(s): SHORTNESS OF BREATH  . Morphine And Related Other (See Comments)    Altered mental status    Patient Measurements: Height: 5\' 8"  (172.7 cm) Weight: 66.2 kg (145 lb 15.1 oz) IBW/kg (Calculated) : 63.9 Heparin Dosing Weight: 66.2 kg  Vital Signs: Temp: 98.5 F (36.9 C) (03/18 0059) Temp Source: Oral (03/18 0059) BP: 131/58 (03/18 0700) Pulse Rate: 67 (03/18 0700)  Labs: Recent Labs    10/19/20 1204 10/19/20 1612 10/20/20 0010 10/20/20 0512 10/20/20 0744  HGB 12.1  --   --  11.1*  --   HCT 38.3  --   --  35.2*  --   PLT 240  --   --  208  --   APTT  --  27  --   --   --   LABPROT  --  13.0  --   --   --   INR  --  1.0  --   --   --   HEPARINUNFRC  --   --  0.32  --  0.23*  CREATININE 0.60  --   --  0.46  --     Estimated Creatinine Clearance: 59.4 mL/min (by C-G formula based on SCr of 0.46 mg/dL).   Medical History: Past Medical History:  Diagnosis Date  . Acute heart failure ([place])   . Aortic stenosis   . CHF (congestive heart failure) ([place])   . Complication of anesthesia    Hard to wake patient up after having anesthesia  . Diabetes mellitus without complication ([place])   .  Diabetic neuropathy (HCC)    Takes Lyrica  . Edema of both legs    Takes Lasix  . GERD (gastroesophageal reflux disease)   . High cholesterol   . HTN (hypertension)   . Hypertension   . PAD (peripheral artery disease) ([place])   . Shortness of breath dyspnea   . Spasm of back muscles   . Stroke ([place])   . Wound, open    Right great toe    Medications:  No DOAC PTA.   Assessment: Pharmacy consulted to start heparin. CBC stable.   Pt started with 3000 units bolus x 1, followed by 800 units/hr  3/18  0010    HL 0.32 3/18  0744  HL 0.23 @ 800 units/hr  Goal of Therapy:  Heparin level 0.3-0.7 units/ml Monitor platelets by anticoagulation protocol: Yes   Plan:  Will bolus 1000 units and increase drip rate to 950 units/hr  Will continue this pt on current rate and draw level in 8 hours per protocol  CBC's daily while on heparin drip  Lu Duffel, PharmD, BCPS Clinical Pharmacist 10/20/2020 8:15 AM

## 2020-10-20 NOTE — Consult Note (Signed)
[place] for Heparin Indication: limb ischemia, gangrene of toe  Allergies  Allergen Reactions  . Iodine Anaphylaxis  . Penicillins Anaphylaxis    Tolerates ceftriaxone, cefazolin Did it involve swelling of the face/tongue/throat, SOB, or low BP? Unknown Did it involve sudden or severe rash/hives, skin peeling, or any reaction on the inside of your mouth or nose? Unknown Did you need to seek medical attention at a hospital or doctor's office? Unknown When did it last happen?Unknown If all above answers are "NO", may proceed with cephalosporin use.   . Shellfish Allergy Anaphylaxis  . Sulfa Antibiotics Anaphylaxis  . Sulfacetamide Sodium Anaphylaxis  . Sulfasalazine Anaphylaxis  . Eggs Or Egg-Derived Products     Other reaction(s): SHORTNESS OF BREATH  . Morphine And Related Other (See Comments)    Altered mental status    Patient Measurements: Height: 5\' 8"  (172.7 cm) Weight: 66.2 kg (145 lb 15.1 oz) IBW/kg (Calculated) : 63.9 Heparin Dosing Weight: 66.2 kg  Vital Signs: BP: 121/48 (03/18 0054) Pulse Rate: 72 (03/17 1530)  Labs: Recent Labs    10/19/20 1204 10/19/20 1612 10/20/20 0010  HGB 12.1  --   --   HCT 38.3  --   --   PLT 240  --   --   APTT  --  27  --   LABPROT  --  13.0  --   INR  --  1.0  --   HEPARINUNFRC  --   --  0.32  CREATININE 0.60  --   --     Estimated Creatinine Clearance: 59.4 mL/min (by C-G formula based on SCr of 0.6 mg/dL).   Medical History: Past Medical History:  Diagnosis Date  . Acute heart failure ([place])   . Aortic stenosis   . CHF (congestive heart failure) ([place])   . Complication of anesthesia    Hard to wake patient up after having anesthesia  . Diabetes mellitus without complication ([place])   . Diabetic neuropathy (HCC)    Takes Lyrica  . Edema of both legs    Takes Lasix  . GERD (gastroesophageal reflux disease)   . High cholesterol   . HTN (hypertension)   . Hypertension    . PAD (peripheral artery disease) ([place])   . Shortness of breath dyspnea   . Spasm of back muscles   . Stroke ([place])   . Wound, open    Right great toe    Medications:  No DOAC PTA.   Assessment: Pharmacy consulted to start heparin. CBC stable.   Goal of Therapy:  Heparin level 0.3-0.7 units/ml Monitor platelets by anticoagulation protocol: Yes   Plan:  3/18:  HL @ 0010 = 0.32 Will continue this pt on current rate and draw confirmation level on 3/18 @ 0800.   Katalena Malveaux D, PharmD 10/20/2020,12:58 AM

## 2020-10-20 NOTE — Consult Note (Signed)
[facility] Vascular Consult Note  MRN : [id number]  DESTIN KITTLER is a 80 y.o. (1941/08/05) female who presents with chief complaint of  Chief Complaint  Patient presents with  . Foot Pain   History of Present Illness:  ASTOU LADA is a 80 year old female with medical history significant for peripheral artery disease on aspirin/Plavix and Lipitor, type 2 diabetes, essential hypertension, hyperlipidemia, chronic diastolic CHF, who presented to [facility] ED due to worsening discoloration in her right foot affecting all 5 digits.    No recent trauma or recent antibiotics. States she has been taking her medications as prescribed. They presented to the ED for further evaluation. X-ray of the right foot showed soft tissue swelling.  Foci of arterial vascular calcification noted.  No R dorsalis pedis pulse felt on palpation. Due to concern for cellulitis with associated peripheral vascular disease patient was started on IV antibiotics empirically and IV heparin.   Discussed surgery was consulted by Dr. Quentin Cornwall for possible endovascular intervention.  Current Facility-Administered Medications  Medication Dose Route Frequency Provider Last Rate Last Admin  . acetaminophen (TYLENOL) tablet 650 mg  650 mg Oral Q6H PRN Kayleen Memos, DO   650 mg at 10/20/20 0549  . amLODipine (NORVASC) tablet 10 mg  10 mg Oral Daily Irene Pap N, DO   10 mg at 10/20/20 0920  . atorvastatin (LIPITOR) tablet 20 mg  20 mg Oral QHS Hall, Carole N, DO   20 mg at 10/19/20 2133  . budesonide (PULMICORT) nebulizer solution 0.25 mg  0.25 mg Nebulization BID Irene Pap N, DO   0.25 mg at 10/20/20 2409  . carvedilol (COREG) tablet 6.25 mg  6.25 mg Oral BID WC Hall, Carole N, DO   6.25 mg at 10/20/20 0917  . [START ON 10/21/2020] cefTRIAXone (ROCEPHIN) 2 g in sodium chloride 0.9 % 100 mL IVPB  2 g Intravenous Q24H Dwyane Dee, MD      . clopidogrel (PLAVIX) tablet 75 mg  75 mg Oral Q breakfast Irene Pap N, DO   75 mg at 10/20/20 7353  . heparin ADULT infusion 100 units/mL (25000 units/259mL)  950 Units/hr Intravenous Continuous Lu Duffel, RPH 9.5 mL/hr at 10/20/20 0913 950 Units/hr at 10/20/20 0913  . hydrALAZINE (APRESOLINE) injection 5 mg  5 mg Intravenous Q6H PRN Irene Pap N, DO      . hydrALAZINE (APRESOLINE) tablet 25 mg  25 mg Oral Q8H Hall, Carole N, DO   25 mg at 10/20/20 1538  . insulin aspart (novoLOG) injection 0-15 Units  0-15 Units Subcutaneous TID WC Irene Pap N, DO   2 Units at 10/19/20 1859  . insulin aspart (novoLOG) injection 0-5 Units  0-5 Units Subcutaneous QHS Hall, Carole N, DO      . lactated ringers infusion   Intravenous Continuous Kayleen Memos, DO 50 mL/hr at 10/20/20 2992 Infusion Verify at 10/20/20 0651  . meropenem (MERREM) 1 g in sodium chloride 0.9 % 100 mL IVPB  1 g Intravenous Lenise Arena, MD 200 mL/hr at 10/20/20 1538 1 g at 10/20/20 1538  . ondansetron (ZOFRAN) injection 4 mg  4 mg Intravenous Q6H PRN Irene Pap N, DO      . oxyCODONE (Oxy IR/ROXICODONE) immediate release tablet 5 mg  5 mg Oral Q6H PRN Irene Pap N, DO   5 mg at 10/19/20 2241  . pantoprazole (PROTONIX) EC tablet 40 mg  40 mg Oral Daily Benita Gutter, [facility]  40 mg at 10/20/20 0919  . vancomycin (VANCOREADY) IVPB 1250 mg/250 mL  1,250 mg Intravenous Q24H Benita Gutter, [facility]       Past Medical History:  Diagnosis Date  . Acute heart failure ([place])   . Aortic stenosis   . CHF (congestive heart failure) ([place])   . Complication of anesthesia    Hard to wake patient up after having anesthesia  . Diabetes mellitus without complication ([place])   . Diabetic neuropathy (H[facility])    Takes Lyrica  . Edema of both legs    Takes Lasix  . GERD (gastroesophageal reflux disease)   . High cholesterol   . HTN (hypertension)   . Hypertension   . PAD (peripheral artery disease) ([place])   . Shortness of breath dyspnea   . Spasm of back muscles   . Stroke ([place])   . Wound,  open    Right great toe   Past Surgical History:  Procedure Laterality Date  . AMPUTATION TOE Left 06/09/2019   Procedure: Left third toe and partial great toe amputation and debridement;  Surgeon: Katha Cabal, MD;  Location: [facility] ORS;  Service: Vascular;  Laterality: Left;  . AMPUTATION TOE Left 04/06/2020   Procedure: AMPUTATION LEFT SECOND TOE;  Surgeon: Samara Deist, DPM;  Location: [facility] OR[facility];  Service: Podiatry;  Laterality: Left;  . BYPASS GRAFT POPLITEAL TO TIBIAL Right 02/28/2016   Procedure: BYPASS GRAFT RIGHT BELOW KNEE POPLITEAL TO PERONEAL USING REVERSED RIGHT GREATER SAPHENOUS VEIN;  Surgeon: Conrad [place], MD;  Location: [place];  Service: Vascular;  Laterality: Right;  . CYST EXCISION     Abdomen  . EYE SURGERY Bilateral    Cataract removal  . INTRAMEDULLARY (IM) NAIL INTERTROCHANTERIC Left 02/04/2014   Procedure: INTRAMEDULLARY (IM) NAIL INTERTROCHANTRIC FEMORAL;  Surgeon: Mauri Pole, MD;  Location: [place];  Service: Orthopedics;  Laterality: Left;  . IR GENERIC HISTORICAL  03/01/2016   IR ANGIO INTRA EXTRACRAN SEL COM CAROTID INNOMINATE UNI R MOD SED 03/01/2016 [name], MD [facility]  . IR GENERIC HISTORICAL  03/01/2016   IR ENDOVASC INTRACRANIAL INF OTHER THAN THROMBO ART INC DIAG ANGIO 03/01/2016 [name], MD [facility]  . IR GENERIC HISTORICAL  03/01/2016   IR INTRAVSC STENT CERV CAROTID W/O EMB-PROT MOD SED INC ANGIO 03/01/2016 [name], MD [facility]  . IR GENERIC HISTORICAL  06/03/2016   IR RADIOLOGIST EVAL & MGMT 06/03/2016 [facility]  . LOWER EXTREMITY ANGIOGRAPHY Left 02/09/2019   Procedure: LOWER EXTREMITY ANGIOGRAPHY;  Surgeon: Katha Cabal, MD;  Location: [place] [facility];  Service: Cardiovascular;  Laterality: Left;  . LOWER EXTREMITY ANGIOGRAPHY Left 03/10/2019   Procedure: LOWER EXTREMITY ANGIOGRAPHY;  Surgeon: Katha Cabal, MD;  Location: [place] [facility];  Service: Cardiovascular;  Laterality: Left;  .  LOWER EXTREMITY ANGIOGRAPHY Left 04/27/2019   Procedure: LOWER EXTREMITY ANGIOGRAPHY;  Surgeon: Katha Cabal, MD;  Location: [place] [facility];  Service: Cardiovascular;  Laterality: Left;  . LOWER EXTREMITY ANGIOGRAPHY Left 06/08/2019   Procedure: Lower Extremity Angiography;  Surgeon: Katha Cabal, MD;  Location: [place] [facility];  Service: Cardiovascular;  Laterality: Left;  . LOWER EXTREMITY ANGIOGRAPHY Left 04/03/2020   Procedure: Lower Extremity Angiography;  Surgeon: Algernon Huxley, MD;  Location: [place] [facility];  Service: Cardiovascular;  Laterality: Left;  . ORIF TOE FRACTURE Right 02/08/2014   Procedure: OPEN REDUCTION INTERNAL FIXATION Right METATARSAL  FRACTURE ;  Surgeon: Wylene Simmer, MD;  Location: [place];  Service: Orthopedics;  Laterality: Right;  . PERIPHERAL VASCULAR CATHETERIZATION N/A 12/28/2015   Procedure: Abdominal Aortogram w/Lower Extremity;  Surgeon: Conrad Ovando, MD;  Location: [place] [facility];  Service: Cardiovascular;  Laterality: N/A;  . RADIOLOGY WITH ANESTHESIA N/A 03/01/2016   Procedure: RADIOLOGY WITH ANESTHESIA;  Surgeon: [name], MD;  Location: [place];  Service: Radiology;  Laterality: N/A;  . VEIN HARVEST Right 02/28/2016   Procedure: RIGHT GREATER SAPHENOUS VEIN HARVEST;  Surgeon: Conrad Bonifay, MD;  Location: [place];  Service: Vascular;  Laterality: Right;   Social History Social History   Tobacco Use  . Smoking status: Never Smoker  . Smokeless tobacco: Never Used  . Tobacco comment: Never smoked  Substance Use Topics  . Alcohol use: No    Alcohol/week: 0.0 standard drinks  . Drug use: No   Family History Family History  Problem Relation Age of Onset  . Diabetes Other   . Liver disease Mother   . CVA Father   . Diabetes Father   . Hypertension Father   Denies history of peripheral artery disease, venous disease or renal disease.  Allergies  Allergen Reactions  . Iodine Anaphylaxis  . Penicillins Anaphylaxis     Tolerates ceftriaxone, cefazolin Did it involve swelling of the face/tongue/throat, SOB, or low BP? Unknown Did it involve sudden or severe rash/hives, skin peeling, or any reaction on the inside of your mouth or nose? Unknown Did you need to seek medical attention at a hospital or doctor's office? Unknown When did it last happen?Unknown If all above answers are "NO", may proceed with cephalosporin use.   . Shellfish Allergy Anaphylaxis  . Sulfa Antibiotics Anaphylaxis  . Sulfacetamide Sodium Anaphylaxis  . Sulfasalazine Anaphylaxis  . Eggs Or Egg-Derived Products     Other reaction(s): SHORTNESS OF BREATH  . Morphine And Related Other (See Comments)    Altered mental status   REVIEW OF SYSTEMS (Negative unless checked)  Constitutional: [] Weight loss  [] Fever  [] Chills Cardiac: [] Chest pain   [] Chest pressure   [] Palpitations   [] Shortness of breath when laying flat   [] Shortness of breath at rest   [] Shortness of breath with exertion. Vascular:  [] Pain in legs with walking   [] Pain in legs at rest   [] Pain in legs when laying flat   [] Claudication   [] Pain in feet when walking  [x] Pain in feet at rest  [x] Pain in feet when laying flat   [] History of DVT   [] Phlebitis   [] Swelling in legs   [] Varicose veins   [] Non-healing ulcers Pulmonary:   [] Uses home oxygen   [] Productive cough   [] Hemoptysis   [] Wheeze  [] COPD   [] Asthma Neurologic:  [] Dizziness  [] Blackouts   [] Seizures   [] History of stroke   [] History of TIA  [] Aphasia   [] Temporary blindness   [] Dysphagia   [] Weakness or numbness in arms   [] Weakness or numbness in legs Musculoskeletal:  [] Arthritis   [] Joint swelling   [] Joint pain   [] Low back pain Hematologic:  [] Easy bruising  [] Easy bleeding   [] Hypercoagulable state   [] Anemic  [] Hepatitis Gastrointestinal:  [] Blood in stool   [] Vomiting blood  [] Gastroesophageal reflux/heartburn   [] Difficulty swallowing. Genitourinary:  [x] Chronic kidney disease   [] Difficult  urination  [] Frequent urination  [] Burning with urination   [] Blood in urine Skin:  [] Rashes   [x] Ulcers   [x] Wounds Psychological:  [] History of anxiety   []  History of major depression.  Physical Examination  Vitals:   10/20/20 1147 10/20/20 1152 10/20/20 1237 10/20/20 1537  BP: (!) 147/48  (!) 150/52 (!) 155/69  Pulse: 74  70 74  Resp: 15  16   Temp:  97.9 F (36.6 C) 98.1 F (36.7 C)   TempSrc:  Oral Oral   SpO2: 99%  99%   Weight:      Height:       Body mass index is 22.19 kg/m. Gen:  WD/WN, NAD Head: [place]/AT, No temporalis wasting. Prominent temp pulse not noted. Ear/Nose/Throat: Hearing grossly intact, nares w/o erythema or drainage, oropharynx w/o Erythema/Exudate Eyes: Sclera non-icteric, conjunctiva clear Neck: Trachea midline.  No JVD.  Pulmonary:  Good air movement, respirations not labored, equal bilaterally.  Cardiac: RRR, normal S1, S2. Vascular:  Vessel Right Left  Radial Palpable Palpable  Ulnar Palpable Palpable  Brachial Palpable Palpable  Carotid Palpable, without bruit Palpable, without bruit  Aorta Not palpable N/A  Femoral Palpable Palpable  Popliteal Palpable Palpable  PT Non-Palpable Palpable  DP Non-Palpable Palpable   Right lower extremity: Thigh soft.  Calf soft.  There is discoloration/gangrenous changes to all 5 toes.  Unable to palpate pedal pulses.    Document Information Photos    10/19/2020 16:45  Attached To:  Hospital Encounter on 10/19/20  Source Information Kayleen Memos, DO  [facility]   Gastrointestinal: soft, non-tender/non-distended. No guarding/reflex.  Musculoskeletal: M/S 5/5 throughout.  Extremities without ischemic changes.  No deformity or atrophy. No edema. Neurologic: Sensation grossly intact in extremities.  Symmetrical.  Speech is fluent. Motor exam as listed above. Psychiatric: Judgment intact, Mood & affect appropriate for pt's clinical situation. Dermatologic: As above  Lymph : No Cervical,  Axillary, or Inguinal lymphadenopathy.  CBC Lab Results  Component Value Date   WBC 10.3 10/20/2020   HGB 11.1 (L) 10/20/2020   HCT 35.2 (L) 10/20/2020   MCV 85.2 10/20/2020   PLT 208 10/20/2020   BMET    Component Value Date/Time   NA 138 10/20/2020 0512   K 3.5 10/20/2020 0512   CL 106 10/20/2020 0512   CO2 26 10/20/2020 0512   GLUCOSE 152 (H) 10/20/2020 0512   BUN 18 10/20/2020 0512   CREATININE 0.46 10/20/2020 0512   CALCIUM 9.2 10/20/2020 0512   GFRNONAA >60 10/20/2020 0512   GFRAA >60 04/06/2020 0535   Estimated Creatinine Clearance: 59.4 mL/min (by C-G formula based on SCr of 0.46 mg/dL).  COAG Lab Results  Component Value Date   INR 1.0 10/19/2020   INR 1.1 03/31/2020   INR 1.1 03/30/2020   Radiology CT Head Wo Contrast  Result Date: 10/19/2020 CLINICAL DATA:  Mental status change. Right foot discoloration. No reported injury. EXAM: CT HEAD WITHOUT CONTRAST TECHNIQUE: Contiguous axial images were obtained from the base of the skull through the vertex without intravenous contrast. COMPARISON:  06/04/2019 head CT. FINDINGS: Brain: Generalized cerebral volume loss. Stable mild right parietal encephalomalacia. Stable calcified extra-axial 1.1 x 1.1 cm mass in the inferior right posterior fossa (series 3/image 4), compatible with meningioma. No evidence of parenchymal hemorrhage or extra-axial fluid collection. No midline shift. No CT evidence of acute infarction. Nonspecific moderate subcortical and periventricular white matter hypodensity, most in keeping with chronic small vessel ischemic change. Cerebral ventricle sizes are stable and concordant with the degree of cerebral volume loss. Vascular: No acute abnormality. Skull: No evidence of calvarial fracture. Sinuses/Orbits: The visualized paranasal sinuses are essentially clear. Other:  The mastoid air cells are unopacified. IMPRESSION: 1. No evidence  of acute intracranial abnormality. 2. Generalized cerebral volume loss  and moderate chronic small vessel ischemic changes in the cerebral white matter. 3. Chronic mild right parietal encephalomalacia. 4. 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 Electronically Signed   By: Ilona Sorrel M.D.   On: 10/19/2020 16:00   DG Foot Complete Right  Result Date: 10/19/2020 CLINICAL DATA:  Pain and discoloration EXAM: RIGHT FOOT COMPLETE - 3+ VIEW COMPARISON:  April 10, 2018. FINDINGS: Frontal, oblique, and lateral views were obtained. There is soft tissue swelling. Bones are markedly osteoporotic. No appreciable acute fracture or dislocation. There is fusion of the first MTP joint. Other joint spaces appear unremarkable. No erosive change or bony destruction. There are multiple foci of arterial vascular calcification. IMPRESSION: Soft tissue swelling. Marked osteoporosis. No acute fracture or dislocation. No erosive change or bony destruction. Fusion first MTP joint, stable. Foci of arterial vascular calcification noted. Electronically Signed   By: Lowella Grip III M.D.   On: 10/19/2020 14:37   Assessment/Plan The patient is a 80 year old female with multiple medical issues including known atherosclerotic disease to the bilateral lower extremities who presents to the [facility] emergency department with progressively worsening gangrenous changes to the right foot  1.  Atherosclerotic disease with gangrene: Patient is well-known to our service.  Her left lower extremity a few months ago.  She presents with progressively worsening gangrenous changes to all 5 toes.  In the setting of known atherosclerotic disease with gangrenous changes to the toes recommend the patient undergo a right lower extremity angiogram possible intervention and attempt assess the patient's anatomy and contributing degree of atherosclerotic disease.  If appropriate, an attempt revascularize electively at that time.  Procedure, risks and benefits were  explained to the patient as well as her husband who gave consent via the phone.  This was witnessed by Berenice Primas, RN.  2.  Hyperlipidemia: Aspirin, Plavix and statin for medical management Encouraged good control as its slows the progression of atherosclerotic disease  3.  Diabetes: On appropriate medications. Encouraged good control as its slows the progression of atherosclerotic disease  Discussed with Dr. Francene Castle, PA-C  10/20/2020 4:57 PM  This note was created with Dragon medical transcription system.  Any error is purely unintentional

## 2020-10-20 NOTE — Assessment & Plan Note (Signed)
-  Continue blood pressure regimen

## 2020-10-20 NOTE — Interval H&P Note (Signed)
History and Physical Interval Note:  10/20/2020 5:52 PM  [name]  has presented today for surgery, with the diagnosis of Atherosclerotic disease to the right lower extremity with gangrene.  The various methods of treatment have been discussed with the patient and family. After consideration of risks, benefits and other options for treatment, the patient has consented to  Procedure(s): Lower Extremity Angiography (Right) as a surgical intervention.  The patient's history has been reviewed, patient examined, no change in status, stable for surgery.  I have reviewed the patient's chart and labs.  Questions were answered to the patient's satisfaction.     [name]

## 2020-10-20 NOTE — Assessment & Plan Note (Signed)
-   Continue SSI and CBG monitoring ?

## 2020-10-20 NOTE — Evaluation (Signed)
Physical Therapy Evaluation Patient Details Name: [name] MRN: [id number] DOB: [birth year] Today's Date: 10/20/2020   History of Present Illness  Pt is a 80 y/o F admitted on 10/19/20 with c/c of discoloration of R foot. Pt being tx for R dorsal foot cellulitis. PMH: PAD, DM2, essential HTN, HLD, chronic diastolic CHF, diabetic neuropathy, dyspnea, stroke  Clinical Impression  Pt received in bed with husband present for session. Per pt's husband pt has been bed bound for the past 2-3 years but he is eager for her to get to the w/c without use of hoyer lift & notes pt recently started OPPT. Pt currently requires +2 total assist for supine<>sit with no active engagement noted from pt as PT has to block BLE knees to prevent pt from sliding forward off of EOB. Provided edu re: hand placement for rolling & assisted pt on/off bed pan as pt initially reports need to have BM but once on bedpan pt reports she no longer has to use it. PT & OT spent significant time educating pt's husband on recommendation of f/u therapy as he is still eager for pt to return to OPPT but currently this PT recommends HHPT f/u. All further PT needs can be met in next venue of care. Will sign off acute PT services at this time.     Follow Up Recommendations Home health PT;Supervision/Assistance - 24 hour    Equipment Recommendations  None recommended by PT    Recommendations for Other Services       Precautions / Restrictions Precautions Precautions: Fall Restrictions Weight Bearing Restrictions: No      Mobility  Bed Mobility Overal bed mobility: Needs Assistance Bed Mobility: Supine to Sit;Sit to Supine;Rolling Rolling: Max assist;+2 for physical assistance   Supine to sit: Total assist;+2 for physical assistance;HOB elevated Sit to supine: Total assist;+2 for physical assistance;HOB elevated   General bed mobility comments: PT provides max cuing to utilize bed rails & to turn head to look at bed rail but pt  with decreased engagement    Transfers                    Ambulation/Gait                Stairs            Wheelchair Mobility    Modified Rankin (Stroke Patients Only)       Balance Overall balance assessment: Needs assistance Sitting-balance support: Feet supported;Bilateral upper extremity supported Sitting balance-Leahy Scale: Zero Sitting balance - Comments: total assist +2 for static sitting EOB with PT blocking knees to prevent pt from sliding forward, no active engagement in keeping trunk upright so immediately returned to supine for safety                                     Pertinent Vitals/Pain Pain Assessment: Faces Faces Pain Scale: Hurts little more Pain Location: bedpan Pain Descriptors / Indicators: Discomfort Pain Intervention(s): Repositioned    Home Living Family/patient expects to be discharged to:: Private residence Living Arrangements: Spouse/significant other Available Help at Discharge: Family Type of Home: House Home Access: Ramped entrance     Home Layout: One level Home Equipment: Wheelchair - manual;Hospital bed;Other (comment) Additional Comments: Hoyer lift    Prior Function Level of Independence: Needs assistance         Comments: Per chart & pt's husband, pt  has been non-ambulatory in hospital bed for the past 2-3 years. Uses hoyer lift to transfer to w/c but pt does not propel herself. Uses public transportation (w/c accessible) to get to appointments. Pt's husband provides total assist for peri hygiene & meal prep. Has an aide 4 hours/day, 3 days/week.     Hand Dominance        Extremity/Trunk Assessment   Upper Extremity Assessment Upper Extremity Assessment: Generalized weakness    Lower Extremity Assessment Lower Extremity Assessment: Generalized weakness (Pt able to lift BLE in bed enough to allow PT to remove pillow from underneath them.)       Communication   Communication:  No difficulties  Cognition Arousal/Alertness: Awake/alert   Overall Cognitive Status: Difficult to assess Area of Impairment: Orientation;Attention;Following commands;Memory;Safety/judgement;Awareness;Problem solving                 Orientation Level:  (oriented to year & "hospital" but not [facility])     Following Commands: Follows one step commands inconsistently;Follows one step commands with increased time Safety/Judgement: Decreased awareness of safety;Decreased awareness of deficits Awareness: Anticipatory;Emergent;Intellectual Problem Solving: Slow processing;Decreased initiation;Difficulty sequencing;Requires tactile cues;Requires verbal cues        General Comments      Exercises     Assessment/Plan    PT Assessment All further PT needs can be met in the next venue of care  PT Problem List Decreased strength;Decreased mobility;Decreased safety awareness;Decreased activity tolerance;Decreased cognition;Decreased skin integrity;Pain;Decreased knowledge of use of DME;Decreased balance       PT Treatment Interventions      PT Goals (Current goals can be found in the Care Plan section)  Acute Rehab PT Goals Patient Stated Goal: to get to the wheelchair again, without the lift PT Goal Formulation: With family Time For Goal Achievement: 11/03/20 Potential to Achieve Goals: Poor    Frequency     Barriers to discharge        Co-evaluation PT/OT/SLP Co-Evaluation/Treatment: Yes Reason for Co-Treatment: Complexity of the patient's impairments (multi-system involvement);For patient/therapist safety;To address functional/ADL transfers PT goals addressed during session: Mobility/safety with mobility;Balance         AM-PAC PT "6 Clicks" Mobility  Outcome Measure Help needed turning from your back to your side while in a flat bed without using bedrails?: Total Help needed moving from lying on your back to sitting on the side of a flat bed without using bedrails?:  Total Help needed moving to and from a bed to a chair (including a wheelchair)?: Total Help needed standing up from a chair using your arms (e.g., wheelchair or bedside chair)?: Total Help needed to walk in hospital room?: Total Help needed climbing 3-5 steps with a railing? : Total 6 Click Score: 6    End of Session   Activity Tolerance: Patient limited by pain;Patient limited by fatigue Patient left: in bed;with call bell/phone within reach;with bed alarm set;with family/visitor present Nurse Communication: Mobility status PT Visit Diagnosis: Muscle weakness (generalized) (M62.81);Difficulty in walking, not elsewhere classified (R26.2)    Time: 1062-6948 PT Time Calculation (min) (ACUTE ONLY): 25 min   Charges:   PT Evaluation $PT Eval Low Complexity: 1 Low PT Treatments $Therapeutic Activity: 8-22 mins        Lavone Nian, PT, DPT 10/20/20, 4:02 PM   Waunita Schooner 10/20/2020, 4:01 PM

## 2020-10-20 NOTE — Assessment & Plan Note (Signed)
-  Unclear if truly symptomatic however patient does have encephalopathy -De-escalate meropenem down to Rocephin

## 2020-10-20 NOTE — H&P (View-Only) (Signed)
[name] Vascular Consult Note  MRN : [id number]  [name] is a 80 y.o. ([birth year]) female who presents with chief complaint of  Chief Complaint  Patient presents with  . Foot Pain   History of Present Illness:  [name] is a 80 year old female with medical history significant for peripheral artery disease on aspirin/Plavix and Lipitor, type 2 diabetes, essential hypertension, hyperlipidemia, chronic diastolic CHF, who presented to [facility] ED due to worsening discoloration in her right foot affecting all 5 digits.    No recent trauma or recent antibiotics. States she has been taking her medications as prescribed. They presented to the ED for further evaluation. X-ray of the right foot showed soft tissue swelling.  Foci of arterial vascular calcification noted.  No R dorsalis pedis pulse felt on palpation. Due to concern for cellulitis with associated peripheral vascular disease patient was started on IV antibiotics empirically and IV heparin.   Discussed surgery was consulted by Dr. Quentin Cornwall for possible endovascular intervention.  Current Facility-Administered Medications  Medication Dose Route Frequency Provider Last Rate Last Admin  . acetaminophen (TYLENOL) tablet 650 mg  650 mg Oral Q6H PRN Kayleen Memos, DO   650 mg at 10/20/20 0549  . amLODipine (NORVASC) tablet 10 mg  10 mg Oral Daily Irene Pap N, DO   10 mg at 10/20/20 0920  . atorvastatin (LIPITOR) tablet 20 mg  20 mg Oral QHS Hall, Carole N, DO   20 mg at 10/19/20 2133  . budesonide (PULMICORT) nebulizer solution 0.25 mg  0.25 mg Nebulization BID Irene Pap N, DO   0.25 mg at 10/20/20 6440  . carvedilol (COREG) tablet 6.25 mg  6.25 mg Oral BID WC Hall, Carole N, DO   6.25 mg at 10/20/20 0917  . [START ON 10/21/2020] cefTRIAXone (ROCEPHIN) 2 g in sodium chloride 0.9 % 100 mL IVPB  2 g Intravenous Q24H Dwyane Dee, MD      . clopidogrel (PLAVIX) tablet 75 mg  75 mg Oral Q breakfast Irene Pap N, DO   75 mg at 10/20/20 3474  . heparin ADULT infusion 100 units/mL (25000 units/251mL)  950 Units/hr Intravenous Continuous Lu Duffel, RPH 9.5 mL/hr at 10/20/20 0913 950 Units/hr at 10/20/20 0913  . hydrALAZINE (APRESOLINE) injection 5 mg  5 mg Intravenous Q6H PRN Irene Pap N, DO      . hydrALAZINE (APRESOLINE) tablet 25 mg  25 mg Oral Q8H Hall, Carole N, DO   25 mg at 10/20/20 1538  . insulin aspart (novoLOG) injection 0-15 Units  0-15 Units Subcutaneous TID WC Irene Pap N, DO   2 Units at 10/19/20 1859  . insulin aspart (novoLOG) injection 0-5 Units  0-5 Units Subcutaneous QHS Hall, Carole N, DO      . lactated ringers infusion   Intravenous Continuous Kayleen Memos, DO 50 mL/hr at 10/20/20 2595 Infusion Verify at 10/20/20 0651  . meropenem (MERREM) 1 g in sodium chloride 0.9 % 100 mL IVPB  1 g Intravenous Lenise Arena, MD 200 mL/hr at 10/20/20 1538 1 g at 10/20/20 1538  . ondansetron (ZOFRAN) injection 4 mg  4 mg Intravenous Q6H PRN Irene Pap N, DO      . oxyCODONE (Oxy IR/ROXICODONE) immediate release tablet 5 mg  5 mg Oral Q6H PRN Irene Pap N, DO   5 mg at 10/19/20 2241  . pantoprazole (PROTONIX) EC tablet 40 mg  40 mg Oral Daily Benita Gutter, [facility]  40 mg at 10/20/20 0919  . vancomycin (VANCOREADY) IVPB 1250 mg/250 mL  1,250 mg Intravenous Q24H Benita Gutter, [facility]       Past Medical History:  Diagnosis Date  . Acute heart failure ([place])   . Aortic stenosis   . CHF (congestive heart failure) ([place])   . Complication of anesthesia    Hard to wake patient up after having anesthesia  . Diabetes mellitus without complication ([place])   . Diabetic neuropathy (H[facility])    Takes Lyrica  . Edema of both legs    Takes Lasix  . GERD (gastroesophageal reflux disease)   . High cholesterol   . HTN (hypertension)   . Hypertension   . PAD (peripheral artery disease) ([place])   . Shortness of breath dyspnea   . Spasm of back muscles   . Stroke ([place])   . Wound,  open    Right great toe   Past Surgical History:  Procedure Laterality Date  . AMPUTATION TOE Left 06/09/2019   Procedure: Left third toe and partial great toe amputation and debridement;  Surgeon: Katha Cabal, MD;  Location: [facility] OR[facility];  Service: Vascular;  Laterality: Left;  . AMPUTATION TOE Left 04/06/2020   Procedure: AMPUTATION LEFT SECOND TOE;  Surgeon: Samara Deist, DPM;  Location: [facility];  Service: Podiatry;  Laterality: Left;  . BYPASS GRAFT POPLITEAL TO TIBIAL Right 02/28/2016   Procedure: BYPASS GRAFT RIGHT BELOW KNEE POPLITEAL TO PERONEAL USING REVERSED RIGHT GREATER SAPHENOUS VEIN;  Surgeon: Conrad Saguache, MD;  Location: [place];  Service: Vascular;  Laterality: Right;  . CYST EXCISION     Abdomen  . EYE SURGERY Bilateral    Cataract removal  . INTRAMEDULLARY (IM) NAIL INTERTROCHANTERIC Left 02/04/2014   Procedure: INTRAMEDULLARY (IM) NAIL INTERTROCHANTRIC FEMORAL;  Surgeon: Mauri Pole, MD;  Location: [place];  Service: Orthopedics;  Laterality: Left;  . IR GENERIC HISTORICAL  03/01/2016   IR ANGIO INTRA EXTRACRAN SEL COM CAROTID INNOMINATE UNI R MOD SED 03/01/2016 [name], MD [facility]  . IR GENERIC HISTORICAL  03/01/2016   IR ENDOVASC INTRACRANIAL INF OTHER THAN THROMBO ART INC DIAG ANGIO 03/01/2016 [name], MD [facility]  . IR GENERIC HISTORICAL  03/01/2016   IR INTRAVSC STENT CERV CAROTID W/O EMB-PROT MOD SED INC ANGIO 03/01/2016 [name], MD [facility]  . IR GENERIC HISTORICAL  06/03/2016   IR RADIOLOGIST EVAL & MGMT 06/03/2016 [facility]  . LOWER EXTREMITY ANGIOGRAPHY Left 02/09/2019   Procedure: LOWER EXTREMITY ANGIOGRAPHY;  Surgeon: Katha Cabal, MD;  Location: [place] [facility];  Service: Cardiovascular;  Laterality: Left;  . LOWER EXTREMITY ANGIOGRAPHY Left 03/10/2019   Procedure: LOWER EXTREMITY ANGIOGRAPHY;  Surgeon: Katha Cabal, MD;  Location: [place] [facility];  Service: Cardiovascular;  Laterality: Left;  .  LOWER EXTREMITY ANGIOGRAPHY Left 04/27/2019   Procedure: LOWER EXTREMITY ANGIOGRAPHY;  Surgeon: Katha Cabal, MD;  Location: [place] [facility];  Service: Cardiovascular;  Laterality: Left;  . LOWER EXTREMITY ANGIOGRAPHY Left 06/08/2019   Procedure: Lower Extremity Angiography;  Surgeon: Katha Cabal, MD;  Location: [place] [facility];  Service: Cardiovascular;  Laterality: Left;  . LOWER EXTREMITY ANGIOGRAPHY Left 04/03/2020   Procedure: Lower Extremity Angiography;  Surgeon: Algernon Huxley, MD;  Location: [place] [facility];  Service: Cardiovascular;  Laterality: Left;  . ORIF TOE FRACTURE Right 02/08/2014   Procedure: OPEN REDUCTION INTERNAL FIXATION Right METATARSAL  FRACTURE ;  Surgeon: Wylene Simmer, MD;  Location: [place];  Service: Orthopedics;  Laterality: Right;  . PERIPHERAL VASCULAR CATHETERIZATION N/A 12/28/2015   Procedure: Abdominal Aortogram w/Lower Extremity;  Surgeon: Conrad Ladonia, MD;  Location: [place] [facility];  Service: Cardiovascular;  Laterality: N/A;  . RADIOLOGY WITH ANESTHESIA N/A 03/01/2016   Procedure: RADIOLOGY WITH ANESTHESIA;  Surgeon: [name], MD;  Location: [place];  Service: Radiology;  Laterality: N/A;  . VEIN HARVEST Right 02/28/2016   Procedure: RIGHT GREATER SAPHENOUS VEIN HARVEST;  Surgeon: Conrad Venice, MD;  Location: [place];  Service: Vascular;  Laterality: Right;   Social History Social History   Tobacco Use  . Smoking status: Never Smoker  . Smokeless tobacco: Never Used  . Tobacco comment: Never smoked  Substance Use Topics  . Alcohol use: No    Alcohol/week: 0.0 standard drinks  . Drug use: No   Family History Family History  Problem Relation Age of Onset  . Diabetes Other   . Liver disease Mother   . CVA Father   . Diabetes Father   . Hypertension Father   Denies history of peripheral artery disease, venous disease or renal disease.  Allergies  Allergen Reactions  . Iodine Anaphylaxis  . Penicillins Anaphylaxis     Tolerates ceftriaxone, cefazolin Did it involve swelling of the face/tongue/throat, SOB, or low BP? Unknown Did it involve sudden or severe rash/hives, skin peeling, or any reaction on the inside of your mouth or nose? Unknown Did you need to seek medical attention at a hospital or doctor's office? Unknown When did it last happen?Unknown If all above answers are "NO", may proceed with cephalosporin use.   . Shellfish Allergy Anaphylaxis  . Sulfa Antibiotics Anaphylaxis  . Sulfacetamide Sodium Anaphylaxis  . Sulfasalazine Anaphylaxis  . Eggs Or Egg-Derived Products     Other reaction(s): SHORTNESS OF BREATH  . Morphine And Related Other (See Comments)    Altered mental status   REVIEW OF SYSTEMS (Negative unless checked)  Constitutional: [] Weight loss  [] Fever  [] Chills Cardiac: [] Chest pain   [] Chest pressure   [] Palpitations   [] Shortness of breath when laying flat   [] Shortness of breath at rest   [] Shortness of breath with exertion. Vascular:  [] Pain in legs with walking   [] Pain in legs at rest   [] Pain in legs when laying flat   [] Claudication   [] Pain in feet when walking  [x] Pain in feet at rest  [x] Pain in feet when laying flat   [] History of DVT   [] Phlebitis   [] Swelling in legs   [] Varicose veins   [] Non-healing ulcers Pulmonary:   [] Uses home oxygen   [] Productive cough   [] Hemoptysis   [] Wheeze  [] COPD   [] Asthma Neurologic:  [] Dizziness  [] Blackouts   [] Seizures   [] History of stroke   [] History of TIA  [] Aphasia   [] Temporary blindness   [] Dysphagia   [] Weakness or numbness in arms   [] Weakness or numbness in legs Musculoskeletal:  [] Arthritis   [] Joint swelling   [] Joint pain   [] Low back pain Hematologic:  [] Easy bruising  [] Easy bleeding   [] Hypercoagulable state   [] Anemic  [] Hepatitis Gastrointestinal:  [] Blood in stool   [] Vomiting blood  [] Gastroesophageal reflux/heartburn   [] Difficulty swallowing. Genitourinary:  [x] Chronic kidney disease   [] Difficult  urination  [] Frequent urination  [] Burning with urination   [] Blood in urine Skin:  [] Rashes   [x] Ulcers   [x] Wounds Psychological:  [] History of anxiety   []  History of major depression.  Physical Examination  Vitals:   10/20/20 1147 10/20/20 1152 10/20/20 1237 10/20/20 1537  BP: (!) 147/48  (!) 150/52 (!) 155/69  Pulse: 74  70 74  Resp: 15  16   Temp:  97.9 F (36.6 C) 98.1 F (36.7 C)   TempSrc:  Oral Oral   SpO2: 99%  99%   Weight:      Height:       Body mass index is 22.19 kg/m. Gen:  WD/WN, NAD Head: [place]/AT, No temporalis wasting. Prominent temp pulse not noted. Ear/Nose/Throat: Hearing grossly intact, nares w/o erythema or drainage, oropharynx w/o Erythema/Exudate Eyes: Sclera non-icteric, conjunctiva clear Neck: Trachea midline.  No JVD.  Pulmonary:  Good air movement, respirations not labored, equal bilaterally.  Cardiac: RRR, normal S1, S2. Vascular:  Vessel Right Left  Radial Palpable Palpable  Ulnar Palpable Palpable  Brachial Palpable Palpable  Carotid Palpable, without bruit Palpable, without bruit  Aorta Not palpable N/A  Femoral Palpable Palpable  Popliteal Palpable Palpable  PT Non-Palpable Palpable  DP Non-Palpable Palpable   Right lower extremity: Thigh soft.  Calf soft.  There is discoloration/gangrenous changes to all 5 toes.  Unable to palpate pedal pulses.    Document Information Photos    10/19/2020 16:45  Attached To:  Hospital Encounter on 10/19/20  Source Information Kayleen Memos, DO  [facility]   Gastrointestinal: soft, non-tender/non-distended. No guarding/reflex.  Musculoskeletal: M/S 5/5 throughout.  Extremities without ischemic changes.  No deformity or atrophy. No edema. Neurologic: Sensation grossly intact in extremities.  Symmetrical.  Speech is fluent. Motor exam as listed above. Psychiatric: Judgment intact, Mood & affect appropriate for pt's clinical situation. Dermatologic: As above  Lymph : No Cervical,  Axillary, or Inguinal lymphadenopathy.  CBC Lab Results  Component Value Date   WBC 10.3 10/20/2020   HGB 11.1 (L) 10/20/2020   HCT 35.2 (L) 10/20/2020   MCV 85.2 10/20/2020   PLT 208 10/20/2020   BMET    Component Value Date/Time   NA 138 10/20/2020 0512   K 3.5 10/20/2020 0512   CL 106 10/20/2020 0512   CO2 26 10/20/2020 0512   GLUCOSE 152 (H) 10/20/2020 0512   BUN 18 10/20/2020 0512   CREATININE 0.46 10/20/2020 0512   CALCIUM 9.2 10/20/2020 0512   GFRNONAA >60 10/20/2020 0512   GFRAA >60 04/06/2020 0535   Estimated Creatinine Clearance: 59.4 mL/min (by C-G formula based on SCr of 0.46 mg/dL).  COAG Lab Results  Component Value Date   INR 1.0 10/19/2020   INR 1.1 03/31/2020   INR 1.1 03/30/2020   Radiology CT Head Wo Contrast  Result Date: 10/19/2020 CLINICAL DATA:  Mental status change. Right foot discoloration. No reported injury. EXAM: CT HEAD WITHOUT CONTRAST TECHNIQUE: Contiguous axial images were obtained from the base of the skull through the vertex without intravenous contrast. COMPARISON:  06/04/2019 head CT. FINDINGS: Brain: Generalized cerebral volume loss. Stable mild right parietal encephalomalacia. Stable calcified extra-axial 1.1 x 1.1 cm mass in the inferior right posterior fossa (series 3/image 4), compatible with meningioma. No evidence of parenchymal hemorrhage or extra-axial fluid collection. No midline shift. No CT evidence of acute infarction. Nonspecific moderate subcortical and periventricular white matter hypodensity, most in keeping with chronic small vessel ischemic change. Cerebral ventricle sizes are stable and concordant with the degree of cerebral volume loss. Vascular: No acute abnormality. Skull: No evidence of calvarial fracture. Sinuses/Orbits: The visualized paranasal sinuses are essentially clear. Other:  The mastoid air cells are unopacified. IMPRESSION: 1. No evidence  of acute intracranial abnormality. 2. Generalized cerebral volume loss  and moderate chronic small vessel ischemic changes in the cerebral white matter. 3. Chronic mild right parietal encephalomalacia. 4. 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 Electronically Signed   By: Ilona Sorrel M.D.   On: 10/19/2020 16:00   DG Foot Complete Right  Result Date: 10/19/2020 CLINICAL DATA:  Pain and discoloration EXAM: RIGHT FOOT COMPLETE - 3+ VIEW COMPARISON:  April 10, 2018. FINDINGS: Frontal, oblique, and lateral views were obtained. There is soft tissue swelling. Bones are markedly osteoporotic. No appreciable acute fracture or dislocation. There is fusion of the first MTP joint. Other joint spaces appear unremarkable. No erosive change or bony destruction. There are multiple foci of arterial vascular calcification. IMPRESSION: Soft tissue swelling. Marked osteoporosis. No acute fracture or dislocation. No erosive change or bony destruction. Fusion first MTP joint, stable. Foci of arterial vascular calcification noted. Electronically Signed   By: Lowella Grip III M.D.   On: 10/19/2020 14:37   Assessment/Plan The patient is a 80 year old female with multiple medical issues including known atherosclerotic disease to the bilateral lower extremities who presents to the [facility] emergency department with progressively worsening gangrenous changes to the right foot  1.  Atherosclerotic disease with gangrene: Patient is well-known to our service.  Her left lower extremity a few months ago.  She presents with progressively worsening gangrenous changes to all 5 toes.  In the setting of known atherosclerotic disease with gangrenous changes to the toes recommend the patient undergo a right lower extremity angiogram possible intervention and attempt assess the patient's anatomy and contributing degree of atherosclerotic disease.  If appropriate, an attempt revascularize electively at that time.  Procedure, risks and benefits were  explained to the patient as well as her husband who gave consent via the phone.  This was witnessed by Berenice Primas, RN.  2.  Hyperlipidemia: Aspirin, Plavix and statin for medical management Encouraged good control as its slows the progression of atherosclerotic disease  3.  Diabetes: On appropriate medications. Encouraged good control as its slows the progression of atherosclerotic disease  Discussed with Dr. Francene Castle, PA-C  10/20/2020 4:57 PM  This note was created with Dragon medical transcription system.  Any error is purely unintentional

## 2020-10-21 LAB — BASIC METABOLIC PANEL
Anion gap: 10 (ref 5–15)
BUN: 16 mg/dL (ref 8–23)
CO2: 19 mmol/L — ABNORMAL LOW (ref 22–32)
Calcium: 8.9 mg/dL (ref 8.9–10.3)
Chloride: 111 mmol/L (ref 98–111)
Creatinine, Ser: 0.62 mg/dL (ref 0.44–1.00)
GFR, Estimated: >60 mL/min (ref >60)
Glucose, Bld: 213 mg/dL — ABNORMAL HIGH (ref 70–99)
Potassium: 3.7 mmol/L (ref 3.5–5.1)
Sodium: 140 mmol/L (ref 135–145)

## 2020-10-21 LAB — CBC WITH DIFFERENTIAL/PLATELET
Abs Immature Granulocytes: 0.05 10*3/uL (ref 0.00–0.07)
Basophils Absolute: 0 10*3/uL (ref 0.0–0.1)
Basophils Relative: 0 %
Eosinophils Absolute: 0 10*3/uL (ref 0.0–0.5)
Eosinophils Relative: 0 %
HCT: 36.7 % (ref 36.0–46.0)
Hemoglobin: 11.6 g/dL — ABNORMAL LOW (ref 12.0–15.0)
Immature Granulocytes: 0 %
Lymphocytes Relative: 9 %
Lymphs Abs: 1.2 10*3/uL (ref 0.7–4.0)
MCH: 26.9 pg (ref 26.0–34.0)
MCHC: 31.6 g/dL (ref 30.0–36.0)
MCV: 85.2 fL (ref 80.0–100.0)
Monocytes Absolute: 0.1 10*3/uL (ref 0.1–1.0)
Monocytes Relative: 1 %
Neutro Abs: 11.9 10*3/uL — ABNORMAL HIGH (ref 1.7–7.7)
Neutrophils Relative %: 90 %
Platelets: 232 10*3/uL (ref 150–400)
RBC: 4.31 MIL/uL (ref 3.87–5.11)
RDW: 14.7 % (ref 11.5–15.5)
WBC: 13.3 10*3/uL — ABNORMAL HIGH (ref 4.0–10.5)
nRBC: 0 % (ref 0.0–0.2)

## 2020-10-21 LAB — GLUCOSE, CAPILLARY
Glucose-Capillary: 194 mg/dL — ABNORMAL HIGH (ref 70–99)
Glucose-Capillary: 225 mg/dL — ABNORMAL HIGH (ref 70–99)
Glucose-Capillary: 137 mg/dL — ABNORMAL HIGH (ref 70–99)

## 2020-10-21 LAB — HEPARIN LEVEL (UNFRACTIONATED)
Heparin Unfractionated: 0.98 IU/mL — ABNORMAL HIGH (ref 0.30–0.70)
Heparin Unfractionated: 0.5 IU/mL (ref 0.30–0.70)

## 2020-10-21 LAB — MAGNESIUM: Magnesium: 2.5 mg/dL — ABNORMAL HIGH (ref 1.7–2.4)

## 2020-10-21 MED ORDER — ENOXAPARIN SODIUM 40 MG/0.4ML ~~LOC~~ SOLN
40.0000 mg | Freq: Every day | SUBCUTANEOUS | Status: DC
  Administered 2020-10-22 – 2020-10-23 (×2): 40 mg via SUBCUTANEOUS
  Filled 2020-10-21 (×2): qty 0.4

## 2020-10-21 NOTE — Assessment & Plan Note (Addendum)
-   patient mostly bedbound at home; will plan on d/c home probably on Monday; [place] arranged PTA

## 2020-10-21 NOTE — Progress Notes (Signed)
Patient was actively bleeding since this am. Per provider order to take off StarClose dressing and hold pressure for 15 minutes. Held pressure for 15 minutes, finally was able to stop bleeding but still has minimal bleeding at that site.  Site covered with gauge and clear tape. It looks good at this time. Night nurse informed as well. Site assessed by both nurses during shift change.

## 2020-10-21 NOTE — Progress Notes (Signed)
The patient has order to take off starclose this am at 8am, unable to take off due to bleeding. Contact the attending and vascular surgeon, they will be rounding on patient.   Order taken from [place] to leave starclose on the patient cover with dressing due to active bleeding. Jonelle Sidle and Claiborne Billings charge nurse are aware of patient situation. Family notified in the room.

## 2020-10-21 NOTE — Progress Notes (Signed)
PROGRESS NOTE    [name]   CBJ:[id number]  DOB: 11-Sep-1940  DOA: 10/19/2020     2  PCP: [name] Richards, MD  CC: Altered mental status, right foot discoloration  Hospital Course: [name] is a 80 y.o. female with medical history significant for peripheral artery disease on aspirin/Plavix and Lipitor, type 2 diabetes, essential hypertension, hyperlipidemia, chronic diastolic CHF, who presented to [facility] ED due to worsening discoloration in her right foot affecting all 5 digits.  Her husband noticed some redness in her R foot dorsal region.  Patient denies having any pain.  No recent trauma or recent antibiotics.  States she has been taking her medications as prescribed.  They presented to the ED for further evaluation.  X-ray of the right foot showed soft tissue swelling.  Foci of arterial vascular calcification noted.  No R dorsalis pedis pulse felt on palpation.  Due to concern for cellulitis with associated peripheral vascular disease patient was started on IV antibiotics empirically and IV heparin.  Vascular surgery Dr Delana Meyer consulted by EDP who recommended to continue IV antibiotics and IV heparin. She was taken to the OR on 10/20/2020 and underwent angioplasty and stent placement to the right superficial femoral and popliteal arteries.   Interval History:  Underwent right superficial femoral artery and popliteal stent placement yesterday.  Husband present bedside this morning.  Her mentation is a little better and she is more oriented however still confused as to the year and president but knew it began with a "B".  ROS: Review of systems not obtained due to patient factors.  Altered mental status  Assessment & Plan: Cellulitis Presented with worsening discoloration of the right dorsal foot X-ray in the ED of right foot showed soft tissue swelling with no evidence of osteomyelitis. Started on IV vancomycin due to allergy to penicillin. Follow blood cultures x2 peripherally  and MRSA screen test. Monitor fever curve and WBC.  PAD (peripheral artery disease) (HCC) Continue home aspirin, Plavix and Lipitor. Vascular surgery consulted by EDP. - patient now s/p RLE angioplasty with stent placement to SFA and popliteal artery - continue asa, plavix and statin per vascular - d/c heparin drip   Physical deconditioning - will need PT eval when okay with vascular to get OOB  Acute metabolic encephalopathy - patient symptoms include AMS - etiology considered due to metabolic derangements however cannot rule out infectious cause as well -Continue fluids and antibiotics and monitor mentation response - some improvement appreciated on 3/19; husband bedside as well   Acute cystitis -Unclear if truly symptomatic however patient does have encephalopathy -De-escalate meropenem down to Rocephin  DM2 (diabetes mellitus, type 2) ([place]) -Continue SSI and CBG monitoring  HTN (hypertension) -Continue blood pressure regimen  Old records reviewed in assessment of this patient  Antimicrobials: Vancomycin 10/19/2020>> current Meropenem 10/19/2020 >> 10/20/2020 Rocephin 10/20/2020>> current  DVT prophylaxis: Discontinue heparin drip on 10/21/2020.  Start on Lovenox for DVT prophylaxis   Code Status:   Code Status: Full Code Family Communication: Husband  Disposition Plan: Status is: Inpatient  Remains inpatient appropriate because:Ongoing diagnostic testing needed not appropriate for outpatient work up, IV treatments appropriate due to intensity of illness or inability to take PO and Inpatient level of care appropriate due to severity of illness   Dispo:  Patient From: Home  Planned Disposition: Home  Medically stable for discharge: No     Risk of unplanned readmission score: Unplanned Admission- Pilot do not use: 18.74   Objective:  Blood pressure (!) 151/59, pulse 86, temperature 97.6 F (36.4 C), resp. rate 16, height 5\' 8"  (1.727 m), weight 66.2 kg, SpO2 100  %.  Examination: General appearance: Slightly more awake and alert compared to yesterday.  Husband present bedside.  No obvious distress Head: Normocephalic, without obvious abnormality, atraumatic Eyes: EOMI Lungs: clear to auscultation bilaterally Heart: regular rate and rhythm and S1, S2 normal Abdomen: Soft, nontender, nondistended, bowel sounds present Extremities: Right foot noted with dark discoloration on dorsum of foot.  First digit noted to be fixed and unable to passively move. Skin: warm, dry Neurologic: Moves all 4 extremities to command  Consultants:   Vascular surgery  Procedures:     Data Reviewed: I have personally reviewed following labs and imaging studies Results for orders placed or performed during the hospital encounter of 10/19/20 (from the past 24 hour(s))  Heparin level (unfractionated)     Status: None   Collection Time: 10/20/20  4:10 PM  Result Value Ref Range   Heparin Unfractionated 0.65 0.30 - 0.70 IU/mL  Glucose, capillary     Status: Abnormal   Collection Time: 10/20/20  5:15 PM  Result Value Ref Range   Glucose-Capillary 143 (H) 70 - 99 mg/dL  Glucose, capillary     Status: Abnormal   Collection Time: 10/20/20  8:26 PM  Result Value Ref Range   Glucose-Capillary 124 (H) 70 - 99 mg/dL  Basic metabolic panel     Status: Abnormal   Collection Time: 10/21/20  5:17 AM  Result Value Ref Range   Sodium 140 135 - 145 mmol/L   Potassium 3.7 3.5 - 5.1 mmol/L   Chloride 111 98 - 111 mmol/L   CO2 19 (L) 22 - 32 mmol/L   Glucose, Bld 213 (H) 70 - 99 mg/dL   BUN 16 8 - 23 mg/dL   Creatinine, Ser 0.62 0.44 - 1.00 mg/dL   Calcium 8.9 8.9 - 10.3 mg/dL   GFR, Estimated >60 >60 mL/min   Anion gap 10 5 - 15  CBC with Differential/Platelet     Status: Abnormal   Collection Time: 10/21/20  5:17 AM  Result Value Ref Range   WBC 13.3 (H) 4.0 - 10.5 K/uL   RBC 4.31 3.87 - 5.11 MIL/uL   Hemoglobin 11.6 (L) 12.0 - 15.0 g/dL   HCT 36.7 36.0 - 46.0 %   MCV  85.2 80.0 - 100.0 fL   MCH 26.9 26.0 - 34.0 pg   MCHC 31.6 30.0 - 36.0 g/dL   RDW 14.7 11.5 - 15.5 %   Platelets 232 150 - 400 K/uL   nRBC 0.0 0.0 - 0.2 %   Neutrophils Relative % 90 %   Neutro Abs 11.9 (H) 1.7 - 7.7 K/uL   Lymphocytes Relative 9 %   Lymphs Abs 1.2 0.7 - 4.0 K/uL   Monocytes Relative 1 %   Monocytes Absolute 0.1 0.1 - 1.0 K/uL   Eosinophils Relative 0 %   Eosinophils Absolute 0.0 0.0 - 0.5 K/uL   Basophils Relative 0 %   Basophils Absolute 0.0 0.0 - 0.1 K/uL   Immature Granulocytes 0 %   Abs Immature Granulocytes 0.05 0.00 - 0.07 K/uL  Magnesium     Status: Abnormal   Collection Time: 10/21/20  5:17 AM  Result Value Ref Range   Magnesium 2.5 (H) 1.7 - 2.4 mg/dL  Heparin level (unfractionated)     Status: Abnormal   Collection Time: 10/21/20  5:17 AM  Result Value Ref Range  Heparin Unfractionated 0.98 (H) 0.30 - 0.70 IU/mL  Glucose, capillary     Status: Abnormal   Collection Time: 10/21/20 12:36 PM  Result Value Ref Range   Glucose-Capillary 194 (H) 70 - 99 mg/dL   Comment 1 Notify RN    Comment 2 Document in Chart     Recent Results (from the past 240 hour(s))  Resp Panel by RT-PCR (Flu A&B, Covid) Nasopharyngeal Swab     Status: None   Collection Time: 10/19/20  3:30 PM   Specimen: Nasopharyngeal Swab; Nasopharyngeal(NP) swabs in vial transport medium  Result Value Ref Range Status   SARS Coronavirus 2 by RT PCR NEGATIVE NEGATIVE Final    Comment: (NOTE) SARS-CoV-2 target nucleic acids are NOT DETECTED.  The SARS-CoV-2 RNA is generally detectable in upper respiratory specimens during the acute phase of infection. The lowest concentration of SARS-CoV-2 viral copies this assay can detect is 138 copies/mL. A negative result does not preclude SARS-Cov-2 infection and should not be used as the sole basis for treatment or other patient management decisions. A negative result may occur with  improper specimen collection/handling, submission of specimen  other than nasopharyngeal swab, presence of viral mutation(s) within the areas targeted by this assay, and inadequate number of viral copies(<138 copies/mL). A negative result must be combined with clinical observations, patient history, and epidemiological information. The expected result is Negative.  Fact Sheet for Patients:  EntrepreneurPulse.com.au  Fact Sheet for Healthcare Providers:  IncredibleEmployment.be  This test is no t yet approved or cleared by the [place] FDA and  has been authorized for detection and/or diagnosis of SARS-CoV-2 by FDA under an Emergency Use Authorization (EUA). This EUA will remain  in effect (meaning this test can be used) for the duration of the COVID-19 declaration under Section 564(b)(1) of the Act, 21 U.S.C.section 360bbb-3(b)(1), unless the authorization is terminated  or revoked sooner.       Influenza A by PCR NEGATIVE NEGATIVE Final   Influenza B by PCR NEGATIVE NEGATIVE Final    Comment: (NOTE) The Xpert Xpress SARS-CoV-2/FLU/RSV plus assay is intended as an aid in the diagnosis of influenza from Nasopharyngeal swab specimens and should not be used as a sole basis for treatment. Nasal washings and aspirates are unacceptable for Xpert Xpress SARS-CoV-2/FLU/RSV testing.  Fact Sheet for Patients: EntrepreneurPulse.com.au  Fact Sheet for Healthcare Providers: IncredibleEmployment.be  This test is not yet approved or cleared by the [place] FDA and has been authorized for detection and/or diagnosis of SARS-CoV-2 by FDA under an Emergency Use Authorization (EUA). This EUA will remain in effect (meaning this test can be used) for the duration of the COVID-19 declaration under Section 564(b)(1) of the Act, 21 U.S.C. section 360bbb-3(b)(1), unless the authorization is terminated or revoked.  Performed at [facility], [address],  [place], [place] [address]   Urine Culture     Status: Abnormal (Preliminary result)   Collection Time: 10/19/20  3:30 PM   Specimen: Urine, Random  Result Value Ref Range Status   Specimen Description   Final    URINE, RANDOM Performed at [facility], [address] [place], [place] [address]    Special Requests   Final    NONE Performed at [facility], [address] [place], [place] [address]    Culture (A)  Final    >=100,000 COLONIES/mL Lonell Grandchild NEGATIVE RODS SUSCEPTIBILITIES TO FOLLOW Performed at [facility], [place] [address] [place], [place] [address]  Report Status PENDING  Incomplete  CULTURE, BLOOD (ROUTINE X 2) w Reflex to ID Panel     Status: None (Preliminary result)   Collection Time: 10/19/20  6:39 PM   Specimen: BLOOD  Result Value Ref Range Status   Specimen Description BLOOD BLOOD LEFT HAND  Final   Special Requests   Final    BOTTLES DRAWN AEROBIC AND ANAEROBIC Blood Culture results may not be optimal due to an inadequate volume of blood received in culture bottles   Culture   Final    NO GROWTH 2 DAYS Performed at [facility], [address] [place], [place] [address]    Report Status PENDING  Incomplete  CULTURE, BLOOD (ROUTINE X 2) w Reflex to ID Panel     Status: None (Preliminary result)   Collection Time: 10/19/20  6:41 PM   Specimen: BLOOD  Result Value Ref Range Status   Specimen Description BLOOD BLOOD RIGHT HAND  Final   Special Requests   Final    BOTTLES DRAWN AEROBIC AND ANAEROBIC Blood Culture results may not be optimal due to an inadequate volume of blood received in culture bottles   Culture   Final    NO GROWTH 2 DAYS Performed at [facility], [address], [place], [place] [address]    Report Status PENDING  Incomplete  MRSA PCR Screening     Status: None   Collection Time: 10/20/20  2:35 AM   Specimen: Nasopharyngeal  Result Value Ref Range Status   MRSA by PCR NEGATIVE NEGATIVE  Final    Comment:        The GeneXpert MRSA Assay (FDA approved for NASAL specimens only), is one component of a comprehensive MRSA colonization surveillance program. It is not intended to diagnose MRSA infection nor to guide or monitor treatment for MRSA infections. Performed at [facility], [address] [place], [place] [address]      Radiology Studies: CT Head Wo Contrast  Result Date: 10/19/2020 CLINICAL DATA:  Mental status change. Right foot discoloration. No reported injury. EXAM: CT HEAD WITHOUT CONTRAST TECHNIQUE: Contiguous axial images were obtained from the base of the skull through the vertex without intravenous contrast. COMPARISON:  06/04/2019 head CT. FINDINGS: Brain: Generalized cerebral volume loss. Stable mild right parietal encephalomalacia. Stable calcified extra-axial 1.1 x 1.1 cm mass in the inferior right posterior fossa (series 3/image 4), compatible with meningioma. No evidence of parenchymal hemorrhage or extra-axial fluid collection. No midline shift. No CT evidence of acute infarction. Nonspecific moderate subcortical and periventricular white matter hypodensity, most in keeping with chronic small vessel ischemic change. Cerebral ventricle sizes are stable and concordant with the degree of cerebral volume loss. Vascular: No acute abnormality. Skull: No evidence of calvarial fracture. Sinuses/Orbits: The visualized paranasal sinuses are essentially clear. Other:  The mastoid air cells are unopacified. IMPRESSION: 1. No evidence of acute intracranial abnormality. 2. Generalized cerebral volume loss and moderate chronic small vessel ischemic changes in the cerebral white matter. 3. Chronic mild right parietal encephalomalacia. 4. 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 Electronically Signed   By: Ilona Sorrel M.D.   On: 10/19/2020 16:00   PERIPHERAL VASCULAR CATHETERIZATION  Result Date: 10/20/2020 See Op Note  CT Head Wo  Contrast  Final Result    DG Foot Complete Right  Final Result      Scheduled Meds: . amLODipine  10 mg Oral Daily  . atorvastatin  20 mg Oral QHS  . budesonide  0.25 mg Nebulization BID  . carvedilol  6.25 mg Oral BID WC  . clopidogrel  75 mg Oral Q breakfast  . hydrALAZINE  25 mg Oral Q8H  . insulin aspart  0-15 Units Subcutaneous TID WC  . insulin aspart  0-5 Units Subcutaneous QHS  . pantoprazole  40 mg Oral Daily  . sodium chloride flush  3 mL Intravenous Q12H   PRN Meds: sodium chloride, acetaminophen, acetaminophen, hydrALAZINE, ondansetron (ZOFRAN) IV, ondansetron (ZOFRAN) IV, oxyCODONE, sodium chloride flush Continuous Infusions: . sodium chloride    . cefTRIAXone (ROCEPHIN)  IV 2 g (10/21/20 1113)  . lactated ringers 50 mL/hr at 10/21/20 0432  . vancomycin Stopped (10/21/20 0032)     LOS: 2 days  Time spent: Greater than 50% of the 35 minute visit was spent in counseling/coordination of care for the patient as laid out in the A&P.   Dwyane Dee, MD [facility] 10/21/2020, 3:26 PM

## 2020-10-21 NOTE — Progress Notes (Signed)
    Subjective  - POD #1, s/p PTA and stent  No complaints   Physical Exam:  Left groin access site with bloody drainage but no hematoma Legs warm       Assessment/Plan:  POD #1  Continue to monitor left groin access site Continue asa. plavix,statin  [name] 10/21/2020 2:10 PM --  Vitals:   10/21/20 1106 10/21/20 1338  BP: (!) 144/59 (!) 151/59  Pulse: 84 86  Resp: 18 16  Temp: 98 F (36.7 C) 97.6 F (36.4 C)  SpO2: 100% 100%    Intake/Output Summary (Last 24 hours) at 10/21/2020 1410 Last data filed at 10/21/2020 5427 Gross per 24 hour  Intake 1093.26 ml  Output 900 ml  Net 193.26 ml     Laboratory CBC    Component Value Date/Time   WBC 13.3 (H) 10/21/2020 0517   HGB 11.6 (L) 10/21/2020 0517   HCT 36.7 10/21/2020 0517   PLT 232 10/21/2020 0517    BMET    Component Value Date/Time   NA 140 10/21/2020 0517   K 3.7 10/21/2020 0517   CL 111 10/21/2020 0517   CO2 19 (L) 10/21/2020 0517   GLUCOSE 213 (H) 10/21/2020 0517   BUN 16 10/21/2020 0517   CREATININE 0.62 10/21/2020 0517   CALCIUM 8.9 10/21/2020 0517   GFRNONAA >60 10/21/2020 0517   GFRAA >60 04/06/2020 0535    COAG Lab Results  Component Value Date   INR 1.0 10/19/2020   INR 1.1 03/31/2020   INR 1.1 03/30/2020   No results found for: PTT  Antibiotics Anti-infectives (From admission, onward)   Start     Dose/Rate Route Frequency Ordered Stop   10/21/20 0800  cefTRIAXone (ROCEPHIN) 2 g in sodium chloride 0.9 % 100 mL IVPB        2 g 200 mL/hr over 30 Minutes Intravenous Every 24 hours 10/20/20 1120     10/20/20 2200  vancomycin (VANCOREADY) IVPB 1250 mg/250 mL        1,250 mg 166.7 mL/hr over 90 Minutes Intravenous Every 24 hours 10/19/20 2019     10/19/20 2200  clindamycin (CLEOCIN) IVPB 600 mg  Status:  Discontinued        600 mg 100 mL/hr over 30 Minutes Intravenous Every 8 hours 10/19/20 1835 10/19/20 1843   10/19/20 2000  meropenem (MERREM) 1 g in sodium chloride  0.9 % 100 mL IVPB        1 g 200 mL/hr over 30 Minutes Intravenous Every 8 hours 10/19/20 1918 10/21/20 0755   10/19/20 2000  vancomycin (VANCOREADY) IVPB 1500 mg/300 mL        1,500 mg 150 mL/hr over 120 Minutes Intravenous  Once 10/19/20 1919 10/20/20 0018       V. Leia Alf, M.D., [facility] [place] Office: [phone] Pager:  [phone]

## 2020-10-21 NOTE — Plan of Care (Signed)

## 2020-10-21 NOTE — Progress Notes (Signed)
Heparin was stopped per order

## 2020-10-21 NOTE — Consult Note (Signed)
Pharmacy Antibiotic Note  [name] is a 80 y.o. female with medical history including PAD, diabetes, HTN, diastolic CHF, HTN, HLD admitted on 10/19/2020 with right foot cellulitis / UTI. 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 Patient also has a history of ESBL E coli from urine culture in 05/2019. Pharmacy has been consulted for vancomycin dosing.  Patient has a history of PCN allergy (anaphylaxis) but has tolerated cephalosporins in the past including cefazolin and ceftriaxone.   Plan: Day 3 of abx.  Meropenem switched to ceftriaxone   Vancomycin 1.5 g IV LD (~23 mg/kg) followed by maintenance dose of vancomycin 1250 mg IV q24h --Calculated AUC: 488, Cmin 10.8. Scr 0.8.  --Daily Scr per protocol while on vancomycin --Plan for vancomycin level in the next 2-3 days if continued.   Continue to monitor renal function and adjust antibiotics as indicated. Continue to monitor culture results and ability to narrow antibiotics.  Height: 5\' 8"  (172.7 cm) Weight: 66.2 kg (145 lb 15.1 oz) IBW/kg (Calculated) : 63.9  Temp (24hrs), Avg:97.7 F (36.5 C), Min:97.4 F (36.3 C), Max:98.1 F (36.7 C)  Recent Labs  Lab 10/19/20 1204 10/19/20 1839 10/20/20 0512 10/21/20 0517  WBC 12.8*  --  10.3 13.3*  CREATININE 0.60  --  0.46 0.62  LATICACIDVEN 1.7 1.4  --   --     Estimated Creatinine Clearance: 59.4 mL/min (by C-G formula based on SCr of 0.62 mg/dL).    Allergies  Allergen Reactions  . Iodine Anaphylaxis  . Penicillins Anaphylaxis    Tolerates ceftriaxone, cefazolin Did it involve swelling of the face/tongue/throat, SOB, or low BP? Unknown Did it involve sudden or severe rash/hives, skin peeling, or any reaction on the inside of your mouth or nose? Unknown Did you need to seek medical attention at a hospital or doctor's office? Unknown When did it last happen?Unknown If all above answers are "NO", may proceed with cephalosporin  use.   . Shellfish Allergy Anaphylaxis  . Sulfa Antibiotics Anaphylaxis  . Sulfacetamide Sodium Anaphylaxis  . Sulfasalazine Anaphylaxis  . Eggs Or Egg-Derived Products     Other reaction(s): SHORTNESS OF BREATH  . Morphine And Related Other (See Comments)    Altered mental status    Antimicrobials this admission: Meropenem 3/17 >>  Vancomycin 3/17 >>   Dose adjustments this admission: N/A  Microbiology results: 3/17 BCx: pending 3/17 UCx: pending  3/17 MRSA PCR: negative  Thank you for allowing pharmacy to be a part of this patient's care.  Oswald Hillock 10/21/2020 9:08 AM

## 2020-10-21 NOTE — Consult Note (Signed)
[place] for Heparin Indication: limb ischemia, gangrene of toe  Allergies  Allergen Reactions   Iodine Anaphylaxis   Penicillins Anaphylaxis    Tolerates ceftriaxone, cefazolin Did it involve swelling of the face/tongue/throat, SOB, or low BP? Unknown Did it involve sudden or severe rash/hives, skin peeling, or any reaction on the inside of your mouth or nose? Unknown Did you need to seek medical attention at a hospital or doctor's office? Unknown When did it last happen?Unknown If all above answers are NO, may proceed with cephalosporin use.    Shellfish Allergy Anaphylaxis   Sulfa Antibiotics Anaphylaxis   Sulfacetamide Sodium Anaphylaxis   Sulfasalazine Anaphylaxis   Eggs Or Egg-Derived Products     Other reaction(s): SHORTNESS OF BREATH   Morphine And Related Other (See Comments)    Altered mental status    Patient Measurements: Height: 5\' 8"  (172.7 cm) Weight: 66.2 kg (145 lb 15.1 oz) IBW/kg (Calculated) : 63.9 Heparin Dosing Weight: 66.2 kg  Vital Signs: Temp: 97.4 F (36.3 C) (03/19 0323) Temp Source: Oral (03/18 2150) BP: 129/44 (03/19 0323) Pulse Rate: 81 (03/19 0323)  Labs: Recent Labs    10/19/20 1204 10/19/20 1612 10/20/20 0010 10/20/20 0512 10/20/20 0744 10/20/20 1610 10/21/20 0517  HGB 12.1  --   --  11.1*  --   --  11.6*  HCT 38.3  --   --  35.2*  --   --  36.7  PLT 240  --   --  208  --   --  232  APTT  --  27  --   --   --   --   --   LABPROT  --  13.0  --   --   --   --   --   INR  --  1.0  --   --   --   --   --   HEPARINUNFRC  --   --    < >  --  0.23* 0.65 0.98*  CREATININE 0.60  --   --  0.46  --   --  0.62   < > = values in this interval not displayed.    Estimated Creatinine Clearance: 59.4 mL/min (by C-G formula based on SCr of 0.62 mg/dL).   Medical History: Past Medical History:  Diagnosis Date   Acute heart failure (HCC)    Aortic stenosis    CHF (congestive heart  failure) (HCC)    Complication of anesthesia    Hard to wake patient up after having anesthesia   Diabetes mellitus without complication (HCC)    Diabetic neuropathy (HCC)    Takes Lyrica   Edema of both legs    Takes Lasix   GERD (gastroesophageal reflux disease)    High cholesterol    HTN (hypertension)    Hypertension    PAD (peripheral artery disease) (HCC)    Shortness of breath dyspnea    Spasm of back muscles    Stroke (HCC)    Wound, open    Right great toe    Medications:  No DOAC PTA.   Assessment: Pharmacy consulted to start heparin. CBC stable.   Pt started with 3000 units bolus x 1, followed by 800 units/hr  3/18  0010    HL 0.32 3/18  0744  HL 0.23 @ 800 units/hr 3/18 1610 HL 0.65 @ 950 units/hr 3/19     0517    HL 0.98 @ 950 units/hr  Patient went for angioplasty, Heparin to resume at 21:30  Goal of Therapy:  Heparin level 0.3-0.7 units/ml Monitor platelets by anticoagulation protocol: Yes   Plan:  3/19:  HL @ 0517 = 0.98 Will decrease heparin drip to 800 units/hr and recheck HL 8 hrs after rate change.   Licet Dunphy D  10/21/2020 6:21 AM

## 2020-10-22 LAB — URINE CULTURE: Culture: >=100000 — AB

## 2020-10-22 LAB — CBC WITH DIFFERENTIAL/PLATELET
Abs Immature Granulocytes: 0.03 10*3/uL (ref 0.00–0.07)
Basophils Absolute: 0.1 10*3/uL (ref 0.0–0.1)
Basophils Relative: 1 %
Eosinophils Absolute: 0.1 10*3/uL (ref 0.0–0.5)
Eosinophils Relative: 1 %
HCT: 30.6 % — ABNORMAL LOW (ref 36.0–46.0)
Hemoglobin: 9.7 g/dL — ABNORMAL LOW (ref 12.0–15.0)
Immature Granulocytes: 0 %
Lymphocytes Relative: 19 %
Lymphs Abs: 2 10*3/uL (ref 0.7–4.0)
MCH: 26.9 pg (ref 26.0–34.0)
MCHC: 31.7 g/dL (ref 30.0–36.0)
MCV: 84.8 fL (ref 80.0–100.0)
Monocytes Absolute: 0.8 10*3/uL (ref 0.1–1.0)
Monocytes Relative: 8 %
Neutro Abs: 7.5 10*3/uL (ref 1.7–7.7)
Neutrophils Relative %: 71 %
Platelets: 209 10*3/uL (ref 150–400)
RBC: 3.61 MIL/uL — ABNORMAL LOW (ref 3.87–5.11)
RDW: 15 % (ref 11.5–15.5)
WBC: 10.6 10*3/uL — ABNORMAL HIGH (ref 4.0–10.5)
nRBC: 0 % (ref 0.0–0.2)

## 2020-10-22 LAB — BASIC METABOLIC PANEL
Anion gap: 5 (ref 5–15)
BUN: 16 mg/dL (ref 8–23)
CO2: 23 mmol/L (ref 22–32)
Calcium: 9 mg/dL (ref 8.9–10.3)
Chloride: 109 mmol/L (ref 98–111)
Creatinine, Ser: 0.5 mg/dL (ref 0.44–1.00)
GFR, Estimated: >60 mL/min (ref >60)
Glucose, Bld: 167 mg/dL — ABNORMAL HIGH (ref 70–99)
Potassium: 3.8 mmol/L (ref 3.5–5.1)
Sodium: 137 mmol/L (ref 135–145)

## 2020-10-22 LAB — GLUCOSE, CAPILLARY
Glucose-Capillary: 165 mg/dL — ABNORMAL HIGH (ref 70–99)
Glucose-Capillary: 135 mg/dL — ABNORMAL HIGH (ref 70–99)
Glucose-Capillary: 146 mg/dL — ABNORMAL HIGH (ref 70–99)
Glucose-Capillary: 140 mg/dL — ABNORMAL HIGH (ref 70–99)

## 2020-10-22 LAB — MAGNESIUM: Magnesium: 2.2 mg/dL (ref 1.7–2.4)

## 2020-10-22 NOTE — TOC Initial Note (Signed)
Transition of Care [facility]) - Initial/Assessment Note    Patient Details  Name: [name] MRN: [id number] Date of Birth: [birth year]  Transition of Care [facility]) CM/SW Contact:    Harriet Masson, RN Phone Number:[phone] 10/22/2020, 5:07 PM  Clinical Narrative:                 Recommended HHPT/OT. Unsuccessful attempt to speak with the pt. Spoke with the spouse [name] who requested [facility] for services. Made a referral to Corene Cornea who is pending with accepting services.  Much discussed on pt's needs as spouse indicates pt has CAPS for 4 hrs daily and is requesting more. RN has informed caregiver to speak with the pt's caseworker. Spouse states he may want to transport pt via EMS at the time of discharge. Support system with son/daughter and spouse provides provides transportation services. Spouse aware to contact the pt's primary for additional needs once pt has been discharged.   TOC will continue to for ongoing needs.  Expected Discharge Plan: [place] Barriers to Discharge: Continued Medical Work up   Patient Goals and CMS Choice     Choice offered to / list presented to : Patient  Expected Discharge Plan and Services Expected Discharge Plan: [place]   Discharge Planning Services: CM Consult Post Acute Care Choice: [place] arrangements for the past 2 months: [place]: PT,OT [place] Agency: [place] ([place]) Date [place]: 10/22/20 Time [place]: 1705 Representative spoke with at [place]: Corene Cornea  Prior Living Arrangements/Services Living arrangements for the past 2 months: [place] with:: Spouse Patient language and need for interpreter reviewed:: Yes Do you feel safe going back to the place where you live?: Yes      Need for Family Participation in Patient Care: Yes (Comment) Care giver support system in  place?: Yes (comment)   Criminal Activity/Legal Involvement Pertinent to Current Situation/Hospitalization: No - Comment as needed  Activities of Daily Living Home Assistive Devices/Equipment: CBG Meter ADL Screening (condition at time of admission) Patient's cognitive ability adequate to safely complete daily activities?: Yes Is the patient deaf or have difficulty hearing?: No Does the patient have difficulty seeing, even when wearing glasses/contacts?: No Does the patient have difficulty concentrating, remembering, or making decisions?: Yes Patient able to express need for assistance with ADLs?: Yes Does the patient have difficulty dressing or bathing?: Yes Independently performs ADLs?: No Communication: Independent Dressing (OT): Needs assistance Is this a change from baseline?: Pre-admission baseline Grooming: Needs assistance Is this a change from baseline?: Pre-admission baseline Feeding: Independent Bathing: Dependent Is this a change from baseline?: Pre-admission baseline Toileting: Needs assistance Is this a change from baseline?: Pre-admission baseline In/Out Bed: Dependent Is this a change from baseline?: Pre-admission baseline Does the patient have difficulty walking or climbing stairs?: Yes Weakness of Legs: Both Weakness of Arms/Hands: Both  Permission Sought/Granted   Permission granted to share information with : Yes, Verbal Permission Granted              Emotional Assessment Appearance:: Appears stated age       Alcohol / Substance Use: Not Applicable Psych Involvement: No (comment)  Admission diagnosis:  Cellulitis [L03.90] Wound cellulitis [L03.90] Gangrene of right foot [facility]) [I96] Patient Active Problem List  Diagnosis Date Noted  . COPD (chronic obstructive pulmonary disease) ([place]) 10/20/2020  . GERD (gastroesophageal reflux disease) 10/20/2020  . Physical deconditioning 10/20/2020  . Cellulitis 10/19/2020  . Necrotic toes ([place])   .  Insulin dependent diabetes mellitus type IA ([place])   . Diabetic neuropathy ([place])   . Gangrene of toe of both feet ([place]) 06/06/2019  . PAD (peripheral artery disease) ([place]) 04/27/2019  . ESBL (extended spectrum beta-lactamase) producing bacteria infection 12/25/2018  . Acute cystitis 04/10/2018  . PVD (peripheral vascular disease) ([place]) 09/12/2016  . Ulcer of right lower extremity ([place]) 08/29/2016  . Acute on chronic diastolic heart failure ([place]) 07/08/2016  . HTN (hypertension) 07/05/2016  . 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 Chronic diastolic CHF (congestive heart failure) ([place]) 03/10/2016  . HLD (hyperlipidemia) 03/09/2016  . Acute respiratory acidosis 03/05/2016  . Anemia, iron deficiency 03/03/2016  . Cerebrovascular accident (CVA) due to thrombosis of precerebral artery ([place]) 03/01/2016  . 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 Atherosclerosis of native arteries of the extremities with gangrene ([place]) 12/08/2015  . Anemia, chronic disease 04/27/2014  . Unspecified hereditary and idiopathic peripheral neuropathy 03/14/2014  . DM2 (diabetes mellitus, type 2) ([place]) 02/14/2014  . Acute metabolic encephalopathy 10/93/2355  . Aortic stenosis 02/04/2014  . Closed left subtrochanteric femur fracture ([place]) 02/03/2014  . Hip fracture, left ([place]) 02/03/2014  . Fibula fracture 02/03/2014  . MTP instability 02/03/2014  . Hip fracture ([place]) 02/03/2014   PCP:  [name], MD Pharmacy:   [facility], [place] [address] Phone: [phone] Fax: [phone]  [place], [place] - [address] [place] [address] Phone: [phone] Fax: [phone]     Social Determinants of Health (SDOH) Interventions    Readmission Risk Interventions Readmission Risk Prevention Plan 12/24/2018  [facility]  Consult Complete  Some recent data might be hidden

## 2020-10-22 NOTE — Progress Notes (Signed)
PROGRESS NOTE    [name]   HUT:[id number]  DOB: 06/10/1941  DOA: 10/19/2020     3  PCP: [name], MD  CC: Altered mental status, right foot discoloration  Hospital Course: [name] is a 80 y.o. female with medical history significant for peripheral artery disease on aspirin/Plavix and Lipitor, type 2 diabetes, essential hypertension, hyperlipidemia, chronic diastolic CHF, who presented to [facility] ED due to worsening discoloration in her right foot affecting all 5 digits.  Her husband noticed some redness in her R foot dorsal region.  Patient denies having any pain.  No recent trauma or recent antibiotics.  States she has been taking her medications as prescribed.  They presented to the ED for further evaluation.  X-ray of the right foot showed soft tissue swelling.  Foci of arterial vascular calcification noted.  No R dorsalis pedis pulse felt on palpation.  Due to concern for cellulitis with associated peripheral vascular disease patient was started on IV antibiotics empirically and IV heparin.  Vascular surgery Dr Delana Meyer consulted by EDP who recommended to continue IV antibiotics and IV heparin. She was taken to the OR on 10/20/2020 and underwent angioplasty and stent placement to the right superficial femoral and popliteal arteries.   Interval History:  No events overnight.  Remains more awake and alert today.  Oriented to her name, year, president.  Had no other concerns or questions for me this morning.  ROS: Constitutional: negative for chills and fevers, Respiratory: negative for cough, Cardiovascular: negative for chest pain and Gastrointestinal: negative for abdominal pain    Assessment & Plan: Cellulitis Presented with worsening discoloration of the right dorsal foot X-ray in the ED of right foot showed soft tissue swelling with no evidence of osteomyelitis. Started on IV vancomycin due to allergy to penicillin. Follow blood cultures x2 (remain negative) Monitor  fever curve and WBC.  PAD (peripheral artery disease) (HCC) Continue home aspirin, Plavix and Lipitor. Vascular surgery consulted by EDP. - patient now s/p RLE angioplasty with stent placement to SFA and popliteal artery - continue asa, plavix and statin per vascular - d/c heparin drip   Physical deconditioning - patient mostly bedbound at home; will plan on d/c home probably on Monday   Acute metabolic encephalopathy - patient symptoms include AMS - etiology considered due to metabolic derangements however cannot rule out infectious cause as well -Continue fluids and antibiotics and monitor mentation response - some improvement appreciated on 3/19; husband bedside as well   Acute cystitis -Unclear if truly symptomatic however patient does have encephalopathy -De-escalate meropenem down to Rocephin  DM2 (diabetes mellitus, type 2) ([place]) -Continue SSI and CBG monitoring  HTN (hypertension) -Continue blood pressure regimen  Old records reviewed in assessment of this patient  Antimicrobials: Vancomycin 10/19/2020>> current Meropenem 10/19/2020 >> 10/20/2020 Rocephin 10/20/2020>> current  DVT prophylaxis: Discontinue heparin drip on 10/21/2020.  Start on Lovenox for DVT prophylaxis   Code Status:   Code Status: Full Code Family Communication: Husband  Disposition Plan: Status is: Inpatient  Remains inpatient appropriate because:Ongoing diagnostic testing needed not appropriate for outpatient work up, IV treatments appropriate due to intensity of illness or inability to take PO and Inpatient level of care appropriate due to severity of illness   Dispo:  Patient From: Home  Planned Disposition: Home  Medically stable for discharge: No     Risk of unplanned readmission score: Unplanned Admission- [place] do not use: 18.81   Objective: Blood pressure (!) 140/47, pulse 62,  temperature 97.9 F (36.6 C), temperature source Axillary, resp. rate 16, height 5\' 8"  (1.727 m), weight  66.2 kg, SpO2 100 %.  Examination: General appearance: Remains more awake and alert.  Complaining of pain in her feet which is chronic. Head: Normocephalic, without obvious abnormality, atraumatic Eyes: EOMI Lungs: clear to auscultation bilaterally Heart: regular rate and rhythm and S1, S2 normal Abdomen: Soft, nontender, nondistended, bowel sounds present Extremities: Right foot noted with dark discoloration on dorsum of foot.  First digit noted to be fixed and unable to passively move. Skin: warm, dry Neurologic: Moves all 4 extremities to command  Consultants:   Vascular surgery  Procedures:     Data Reviewed: I have personally reviewed following labs and imaging studies Results for orders placed or performed during the hospital encounter of 10/19/20 (from the past 24 hour(s))  Heparin level (unfractionated)     Status: None   Collection Time: 10/21/20  3:07 PM  Result Value Ref Range   Heparin Unfractionated 0.50 0.30 - 0.70 IU/mL  Glucose, capillary     Status: Abnormal   Collection Time: 10/21/20  5:21 PM  Result Value Ref Range   Glucose-Capillary 225 (H) 70 - 99 mg/dL   Comment 1 Notify RN    Comment 2 Document in Chart   Glucose, capillary     Status: Abnormal   Collection Time: 10/21/20  9:30 PM  Result Value Ref Range   Glucose-Capillary 137 (H) 70 - 99 mg/dL  Basic metabolic panel     Status: Abnormal   Collection Time: 10/22/20  5:40 AM  Result Value Ref Range   Sodium 137 135 - 145 mmol/L   Potassium 3.8 3.5 - 5.1 mmol/L   Chloride 109 98 - 111 mmol/L   CO2 23 22 - 32 mmol/L   Glucose, Bld 167 (H) 70 - 99 mg/dL   BUN 16 8 - 23 mg/dL   Creatinine, Ser 0.50 0.44 - 1.00 mg/dL   Calcium 9.0 8.9 - 10.3 mg/dL   GFR, Estimated >60 >60 mL/min   Anion gap 5 5 - 15  CBC with Differential/Platelet     Status: Abnormal   Collection Time: 10/22/20  5:40 AM  Result Value Ref Range   WBC 10.6 (H) 4.0 - 10.5 K/uL   RBC 3.61 (L) 3.87 - 5.11 MIL/uL   Hemoglobin 9.7  (L) 12.0 - 15.0 g/dL   HCT 30.6 (L) 36.0 - 46.0 %   MCV 84.8 80.0 - 100.0 fL   MCH 26.9 26.0 - 34.0 pg   MCHC 31.7 30.0 - 36.0 g/dL   RDW 15.0 11.5 - 15.5 %   Platelets 209 150 - 400 K/uL   nRBC 0.0 0.0 - 0.2 %   Neutrophils Relative % 71 %   Neutro Abs 7.5 1.7 - 7.7 K/uL   Lymphocytes Relative 19 %   Lymphs Abs 2.0 0.7 - 4.0 K/uL   Monocytes Relative 8 %   Monocytes Absolute 0.8 0.1 - 1.0 K/uL   Eosinophils Relative 1 %   Eosinophils Absolute 0.1 0.0 - 0.5 K/uL   Basophils Relative 1 %   Basophils Absolute 0.1 0.0 - 0.1 K/uL   Immature Granulocytes 0 %   Abs Immature Granulocytes 0.03 0.00 - 0.07 K/uL  Magnesium     Status: None   Collection Time: 10/22/20  5:40 AM  Result Value Ref Range   Magnesium 2.2 1.7 - 2.4 mg/dL  Glucose, capillary     Status: Abnormal   Collection Time:  10/22/20  8:11 AM  Result Value Ref Range   Glucose-Capillary 165 (H) 70 - 99 mg/dL   Comment 1 Notify RN    Comment 2 Document in Chart   Glucose, capillary     Status: Abnormal   Collection Time: 10/22/20 12:07 PM  Result Value Ref Range   Glucose-Capillary 135 (H) 70 - 99 mg/dL   Comment 1 Notify RN    Comment 2 Document in Chart     Recent Results (from the past 240 hour(s))  Resp Panel by RT-PCR (Flu A&B, Covid) Nasopharyngeal Swab     Status: None   Collection Time: 10/19/20  3:30 PM   Specimen: Nasopharyngeal Swab; Nasopharyngeal(NP) swabs in vial transport medium  Result Value Ref Range Status   SARS Coronavirus 2 by RT PCR NEGATIVE NEGATIVE Final    Comment: (NOTE) SARS-CoV-2 target nucleic acids are NOT DETECTED.  The SARS-CoV-2 RNA is generally detectable in upper respiratory specimens during the acute phase of infection. The lowest concentration of SARS-CoV-2 viral copies this assay can detect is 138 copies/mL. A negative result does not preclude SARS-Cov-2 infection and should not be used as the sole basis for treatment or other patient management decisions. A negative result  may occur with  improper specimen collection/handling, submission of specimen other than nasopharyngeal swab, presence of viral mutation(s) within the areas targeted by this assay, and inadequate number of viral copies(<138 copies/mL). A negative result must be combined with clinical observations, patient history, and epidemiological information. The expected result is Negative.  Fact Sheet for Patients:  EntrepreneurPulse.com.au  Fact Sheet for Healthcare Providers:  IncredibleEmployment.be  This test is no t yet approved or cleared by the [place] FDA and  has been authorized for detection and/or diagnosis of SARS-CoV-2 by FDA under an Emergency Use Authorization (EUA). This EUA will remain  in effect (meaning this test can be used) for the duration of the COVID-19 declaration under Section 564(b)(1) of the Act, 21 U.S.C.section 360bbb-3(b)(1), unless the authorization is terminated  or revoked sooner.       Influenza A by PCR NEGATIVE NEGATIVE Final   Influenza B by PCR NEGATIVE NEGATIVE Final    Comment: (NOTE) The Xpert Xpress SARS-CoV-2/FLU/RSV plus assay is intended as an aid in the diagnosis of influenza from Nasopharyngeal swab specimens and should not be used as a sole basis for treatment. Nasal washings and aspirates are unacceptable for Xpert Xpress SARS-CoV-2/FLU/RSV testing.  Fact Sheet for Patients: EntrepreneurPulse.com.au  Fact Sheet for Healthcare Providers: IncredibleEmployment.be  This test is not yet approved or cleared by the [place] FDA and has been authorized for detection and/or diagnosis of SARS-CoV-2 by FDA under an Emergency Use Authorization (EUA). This EUA will remain in effect (meaning this test can be used) for the duration of the COVID-19 declaration under Section 564(b)(1) of the Act, 21 U.S.C. section 360bbb-3(b)(1), unless the authorization is terminated  or revoked.  Performed at [facility], [address] [place], [place] [address]   Urine Culture     Status: Abnormal (Preliminary result)   Collection Time: 10/19/20  3:30 PM   Specimen: Urine, Random  Result Value Ref Range Status   Specimen Description   Final    URINE, RANDOM Performed at [facility], [address], [place], [place] [address]    Special Requests   Final    NONE Performed at [facility], [address], [place], [place] [address]    Culture (A)  Final    >=  100,000 COLONIES/mL GRAM NEGATIVE RODS SUSCEPTIBILITIES TO FOLLOW Performed at [facility], [place] [address], [place], [place] [address]    Report Status PENDING  Incomplete  CULTURE, BLOOD (ROUTINE X 2) w Reflex to ID Panel     Status: None (Preliminary result)   Collection Time: 10/19/20  6:39 PM   Specimen: BLOOD  Result Value Ref Range Status   Specimen Description BLOOD BLOOD LEFT HAND  Final   Special Requests   Final    BOTTLES DRAWN AEROBIC AND ANAEROBIC Blood Culture results may not be optimal due to an inadequate volume of blood received in culture bottles   Culture   Final    NO GROWTH 3 DAYS Performed at [facility], [address] [place], [place] [address]    Report Status PENDING  Incomplete  CULTURE, BLOOD (ROUTINE X 2) w Reflex to ID Panel     Status: None (Preliminary result)   Collection Time: 10/19/20  6:41 PM   Specimen: BLOOD  Result Value Ref Range Status   Specimen Description BLOOD BLOOD RIGHT HAND  Final   Special Requests   Final    BOTTLES DRAWN AEROBIC AND ANAEROBIC Blood Culture results may not be optimal due to an inadequate volume of blood received in culture bottles   Culture   Final    NO GROWTH 3 DAYS Performed at [facility], [address] [place], [place] [address]    Report Status PENDING  Incomplete  MRSA PCR Screening     Status: None   Collection Time: 10/20/20  2:35 AM   Specimen:  Nasopharyngeal  Result Value Ref Range Status   MRSA by PCR NEGATIVE NEGATIVE Final    Comment:        The GeneXpert MRSA Assay (FDA approved for NASAL specimens only), is one component of a comprehensive MRSA colonization surveillance program. It is not intended to diagnose MRSA infection nor to guide or monitor treatment for MRSA infections. Performed at [facility], [address], [place], [place] [address]      Radiology Studies: PERIPHERAL VASCULAR CATHETERIZATION  Result Date: 10/20/2020 See Op Note  CT Head Wo Contrast  Final Result    DG Foot Complete Right  Final Result      Scheduled Meds: . amLODipine  10 mg Oral Daily  . atorvastatin  20 mg Oral QHS  . budesonide  0.25 mg Nebulization BID  . carvedilol  6.25 mg Oral BID WC  . clopidogrel  75 mg Oral Q breakfast  . enoxaparin (LOVENOX) injection  40 mg Subcutaneous Daily  . hydrALAZINE  25 mg Oral Q8H  . insulin aspart  0-15 Units Subcutaneous TID WC  . insulin aspart  0-5 Units Subcutaneous QHS  . pantoprazole  40 mg Oral Daily  . sodium chloride flush  3 mL Intravenous Q12H   PRN Meds: sodium chloride, acetaminophen, acetaminophen, hydrALAZINE, ondansetron (ZOFRAN) IV, ondansetron (ZOFRAN) IV, oxyCODONE, sodium chloride flush Continuous Infusions: . sodium chloride    . cefTRIAXone (ROCEPHIN)  IV 2 g (10/22/20 0846)  . vancomycin Stopped (10/22/20 0740)     LOS: 3 days  Time spent: Greater than 50% of the 35 minute visit was spent in counseling/coordination of care for the patient as laid out in the A&P.   Dwyane Dee, MD [facility] 10/22/2020, 12:57 PM

## 2020-10-22 NOTE — Progress Notes (Signed)
    Subjective  - POD #2, status post angiogram with angioplasty  She had bleeding from her cannulation site last night.  The dressing was removed and manual pressure was held.  She has not had any further episodes of bleeding.   Physical Exam:  Palpable femoral pulses.  The left groin access site is soft without hematoma.  There is no further bleeding.  The foot shows decreased darkening of the skin.       Assessment/Plan:  POD #2  The patient is now on Plavix and Lipitor.  I would add aspirin 81 mg to her medical regimen.  She is stable from a vascular perspective and can be discharged when medical issues are resolved.  Wells Shamell Hittle 10/22/2020 3:05 PM --  Vitals:   10/22/20 0818 10/22/20 0826  BP:  (!) 140/47  Pulse: 61 62  Resp: 14 16  Temp:  97.9 F (36.6 C)  SpO2: 97% 100%    Intake/Output Summary (Last 24 hours) at 10/22/2020 1505 Last data filed at 10/22/2020 1018 Gross per 24 hour  Intake 737.26 ml  Output 700 ml  Net 37.26 ml     Laboratory CBC    Component Value Date/Time   WBC 10.6 (H) 10/22/2020 0540   HGB 9.7 (L) 10/22/2020 0540   HCT 30.6 (L) 10/22/2020 0540   PLT 209 10/22/2020 0540    BMET    Component Value Date/Time   NA 137 10/22/2020 0540   K 3.8 10/22/2020 0540   CL 109 10/22/2020 0540   CO2 23 10/22/2020 0540   GLUCOSE 167 (H) 10/22/2020 0540   BUN 16 10/22/2020 0540   CREATININE 0.50 10/22/2020 0540   CALCIUM 9.0 10/22/2020 0540   GFRNONAA >60 10/22/2020 0540   GFRAA >60 04/06/2020 0535    COAG Lab Results  Component Value Date   INR 1.0 10/19/2020   INR 1.1 03/31/2020   INR 1.1 03/30/2020   No results found for: PTT  Antibiotics Anti-infectives (From admission, onward)   Start     Dose/Rate Route Frequency Ordered Stop   10/21/20 0800  cefTRIAXone (ROCEPHIN) 2 g in sodium chloride 0.9 % 100 mL IVPB        2 g 200 mL/hr over 30 Minutes Intravenous Every 24 hours 10/20/20 1120     10/20/20 2200  vancomycin  (VANCOREADY) IVPB 1250 mg/250 mL        1,250 mg 166.7 mL/hr over 90 Minutes Intravenous Every 24 hours 10/19/20 2019     10/19/20 2200  clindamycin (CLEOCIN) IVPB 600 mg  Status:  Discontinued        600 mg 100 mL/hr over 30 Minutes Intravenous Every 8 hours 10/19/20 1835 10/19/20 1843   10/19/20 2000  meropenem (MERREM) 1 g in sodium chloride 0.9 % 100 mL IVPB        1 g 200 mL/hr over 30 Minutes Intravenous Every 8 hours 10/19/20 1918 10/21/20 0755   10/19/20 2000  vancomycin (VANCOREADY) IVPB 1500 mg/300 mL        1,500 mg 150 mL/hr over 120 Minutes Intravenous  Once 10/19/20 1919 10/20/20 0018       V. Leia Alf, M.D., [facility] [place] Office: [phone] Pager:  [phone]

## 2020-10-22 NOTE — Plan of Care (Signed)

## 2020-10-23 ENCOUNTER — Encounter: Payer: Self-pay | Admitting: Vascular Surgery

## 2020-10-23 ENCOUNTER — Ambulatory Visit: Payer: Medicare HMO | Admitting: Physical Therapy

## 2020-10-23 LAB — MAGNESIUM: Magnesium: 2.1 mg/dL (ref 1.7–2.4)

## 2020-10-23 LAB — CBC WITH DIFFERENTIAL/PLATELET
Abs Immature Granulocytes: 0.05 10*3/uL (ref 0.00–0.07)
Basophils Absolute: 0.1 10*3/uL (ref 0.0–0.1)
Basophils Relative: 1 %
Eosinophils Absolute: 0.2 10*3/uL (ref 0.0–0.5)
Eosinophils Relative: 2 %
HCT: 32.8 % — ABNORMAL LOW (ref 36.0–46.0)
Hemoglobin: 10.5 g/dL — ABNORMAL LOW (ref 12.0–15.0)
Immature Granulocytes: 1 %
Lymphocytes Relative: 20 %
Lymphs Abs: 2 10*3/uL (ref 0.7–4.0)
MCH: 27 pg (ref 26.0–34.0)
MCHC: 32 g/dL (ref 30.0–36.0)
MCV: 84.3 fL (ref 80.0–100.0)
Monocytes Absolute: 0.9 10*3/uL (ref 0.1–1.0)
Monocytes Relative: 8 %
Neutro Abs: 7.1 10*3/uL (ref 1.7–7.7)
Neutrophils Relative %: 68 %
Platelets: 212 10*3/uL (ref 150–400)
RBC: 3.89 MIL/uL (ref 3.87–5.11)
RDW: 15.1 % (ref 11.5–15.5)
WBC: 10.3 10*3/uL (ref 4.0–10.5)
nRBC: 0 % (ref 0.0–0.2)

## 2020-10-23 LAB — BASIC METABOLIC PANEL
Anion gap: 9 (ref 5–15)
BUN: 10 mg/dL (ref 8–23)
CO2: 20 mmol/L — ABNORMAL LOW (ref 22–32)
Calcium: 8.9 mg/dL (ref 8.9–10.3)
Chloride: 109 mmol/L (ref 98–111)
Creatinine, Ser: 0.44 mg/dL (ref 0.44–1.00)
GFR, Estimated: >60 mL/min (ref >60)
Glucose, Bld: 163 mg/dL — ABNORMAL HIGH (ref 70–99)
Potassium: 3.8 mmol/L (ref 3.5–5.1)
Sodium: 138 mmol/L (ref 135–145)

## 2020-10-23 LAB — GLUCOSE, CAPILLARY
Glucose-Capillary: 182 mg/dL — ABNORMAL HIGH (ref 70–99)
Glucose-Capillary: 249 mg/dL — ABNORMAL HIGH (ref 70–99)

## 2020-10-23 MED ORDER — CLINDAMYCIN HCL 150 MG PO CAPS: 150.0000 mg | ORAL_CAPSULE | Freq: Four times a day (QID) | ORAL | 0 refills | Status: AC

## 2020-10-23 MED ORDER — ASPIRIN EC 81 MG PO TBEC
81.0000 mg | DELAYED_RELEASE_TABLET | Freq: Every day | ORAL | Status: DC
  Administered 2020-10-23: 81 mg via ORAL
  Filled 2020-10-23: qty 1

## 2020-10-23 NOTE — Plan of Care (Signed)
  Problem: Clinical Measurements: Goal: Diagnostic test results will improve Outcome: Progressing Goal: Cardiovascular complication will be avoided Outcome: Progressing   

## 2020-10-23 NOTE — Discharge Summary (Signed)
Physician Discharge Summary   [name] IEP:[id number] DOB: November 19, 1940 DOA: 10/19/2020  PCP: [name] Richards, MD  Admit date: 10/19/2020 Discharge date: 10/23/2020   Admitted From: home Disposition:  home Discharging physician: [name] Dee, MD  Recommendations for Outpatient Follow-up:  1. Continue wound care monitoring with [place]: PT/OT/RN  Patient discharged to home in Discharge Condition: stable Risk of unplanned readmission score: Unplanned Admission- Pilot do not use: 19.47  CODE STATUS: Full Diet recommendation:  Diet Orders (From admission, onward)    Start     Ordered   10/23/20 0000  Diet - low sodium heart healthy        10/23/20 1205   10/20/20 2020  Diet Carb Modified Fluid consistency: Thin; Room service appropriate? Yes  Diet effective now       Question Answer Comment  Diet-HS Snack? Nothing   Calorie Level Medium 1600-2000   Fluid consistency: Thin   Room service appropriate? Yes      10/20/20 2019          Hospital Course: [name] is a 80 y.o. female with medical history significant for peripheral artery disease on aspirin/Plavix and Lipitor, type 2 diabetes, essential hypertension, hyperlipidemia, chronic diastolic CHF, who presented to [facility] ED due to worsening discoloration in her right foot affecting all 5 digits.  Her husband noticed some redness in her R foot dorsal region.  Patient denies having any pain.  No recent trauma or recent antibiotics.  States she has been taking her medications as prescribed.  They presented to the ED for further evaluation.  X-ray of the right foot showed soft tissue swelling.  Foci of arterial vascular calcification noted.  No R dorsalis pedis pulse felt on palpation.  Due to concern for cellulitis with associated peripheral vascular disease patient was started on IV antibiotics empirically and IV heparin.  Vascular surgery Dr Delana Meyer consulted by EDP who recommended to continue IV antibiotics and IV  heparin. She was taken to the OR on 10/20/2020 and underwent angioplasty and stent placement to the right superficial femoral and popliteal arteries. Her heparin was discontinued and she was continued on aspirin, Plavix, statin.  She was considered stable for discharging home after further monitoring in the hospital and will follow up outpatient. Antibiotics were transitioned to clindamycin at discharge to complete total of 7-day course.   Cellulitis Presented with worsening discoloration of the right dorsal foot X-ray in the ED of right foot showed soft tissue swelling with no evidence of osteomyelitis. Started on IV vancomycin due to allergy to penicillin. Follow blood cultures x2 (remain negative) Monitor fever curve and WBC. -Discharged on clindamycin to complete total of 7-day course  PAD (peripheral artery disease) (HCC) Continue home aspirin, Plavix and Lipitor. Vascular surgery consulted by EDP. - patient now s/p RLE angioplasty with stent placement to SFA and popliteal artery - continue asa, plavix and statin per vascular - d/c heparin drip   Physical deconditioning - patient mostly bedbound at home; will plan on d/c home probably on Monday; [place] arranged PTA  Acute metabolic encephalopathy-resolved as of 10/23/2020 - patient symptoms include AMS - etiology considered due to metabolic derangements however cannot rule out infectious cause as well -Continue fluids and antibiotics and monitor mentation response - some improvement appreciated on 3/19; husband bedside as well   DM2 (diabetes mellitus, type 2) ([place]) -Continue SSI and CBG monitoring  HTN (hypertension) -Continue blood pressure regimen  Acute cystitis-resolved as of 10/23/2020 -Unclear  if truly symptomatic however patient does have encephalopathy -De-escalate meropenem down to Rocephin    The patient's chronic medical conditions were treated accordingly per the patient's home medication regimen except as noted.   On day of discharge, patient was felt deemed stable for discharge. Patient/family member advised to call PCP or come back to ER if needed.   Principal Diagnosis: <principal problem not specified>  Discharge Diagnoses: Active Hospital Problems   Diagnosis Date Noted  . Cellulitis 10/19/2020    Priority: High  . PAD (peripheral artery disease) ([place]) 04/27/2019    Priority: High  . Physical deconditioning 10/20/2020    Priority: Medium  . COPD (chronic obstructive pulmonary disease) ([place]) 10/20/2020  . GERD (gastroesophageal reflux disease) 10/20/2020  . HTN (hypertension) 07/05/2016  . HLD (hyperlipidemia) 03/09/2016  . DM2 (diabetes mellitus, type 2) ([place]) 02/14/2014    Resolved Hospital Problems   Diagnosis Date Noted Date Resolved  . Acute metabolic encephalopathy 60/63/0160 10/23/2020    Priority: Medium  . Acute cystitis 04/10/2018 10/23/2020    Discharge Instructions    Diet - low sodium heart healthy   Complete by: As directed    Increase activity slowly   Complete by: As directed    No wound care   Complete by: As directed      Allergies as of 10/23/2020      Reactions   Iodine Anaphylaxis   Penicillins Anaphylaxis   Tolerates ceftriaxone, cefazolin Did it involve swelling of the face/tongue/throat, SOB, or low BP? Unknown Did it involve sudden or severe rash/hives, skin peeling, or any reaction on the inside of your mouth or nose? Unknown Did you need to seek medical attention at a hospital or doctor's office? Unknown When did it last happen?Unknown If all above answers are "NO", may proceed with cephalosporin use.   Shellfish Allergy Anaphylaxis   Sulfa Antibiotics Anaphylaxis   Sulfacetamide Sodium Anaphylaxis   Sulfasalazine Anaphylaxis   Eggs Or Egg-derived Products    Other reaction(s): SHORTNESS OF BREATH   Morphine And Related Other (See Comments)   Altered mental status      Medication List    STOP taking these medications   gabapentin 300  MG capsule Commonly known as: NEURONTIN   oxyCODONE-acetaminophen 5-325 MG tablet Commonly known as: PERCOCET/ROXICET     TAKE these medications   acetaminophen 500 MG tablet Commonly known as: TYLENOL Take 1,000 mg by mouth every 6 (six) hours as needed for mild pain.   amLODipine 10 MG tablet Commonly known as: NORVASC Take 10 mg by mouth daily.   aspirin 81 MG EC tablet Take 1 tablet (81 mg total) by mouth daily.   atorvastatin 20 MG tablet Commonly known as: LIPITOR Take 1 tablet (20 mg total) by mouth daily. What changed: when to take this   budesonide 0.25 MG/2ML nebulizer solution Commonly known as: PULMICORT Take 2 mLs (0.25 mg total) by nebulization 2 (two) times daily.   carvedilol 6.25 MG tablet Commonly known as: COREG Take 6.25 mg by mouth 2 (two) times daily with a meal.   cetirizine 10 MG tablet Commonly known as: ZYRTEC Take 10 mg by mouth daily.   clindamycin 150 MG capsule Commonly known as: CLEOCIN Take 1 capsule (150 mg total) by mouth 4 (four) times daily for 4 days.   clopidogrel 75 MG tablet Commonly known as: PLAVIX Take 1 tablet (75 mg total) by mouth daily with breakfast.   docusate sodium 100 MG capsule Commonly known as: COLACE Take 100  mg by mouth 2 (two) times daily.   fluticasone 50 MCG/ACT nasal spray Commonly known as: FLONASE Place 1 spray into both nostrils daily as needed for allergies.   hydrALAZINE 25 MG tablet Commonly known as: APRESOLINE Take 1 tablet (25 mg total) by mouth every 8 (eight) hours. What changed: when to take this   insulin glargine 100 UNIT/ML injection Commonly known as: LANTUS Inject 0.15 mLs (15 Units total) into the skin at bedtime. What changed: how much to take   magnesium oxide 400 MG tablet Commonly known as: MAG-OX Take 400 mg by mouth 2 (two) times daily.   modafinil 200 MG tablet Commonly known as: PROVIGIL Take 200 mg by mouth daily.   omeprazole 20 MG capsule Commonly known as:  PRILOSEC Take 1 capsule (20 mg total) by mouth daily.   polyethylene glycol 17 g packet Commonly known as: MIRALAX / GLYCOLAX Take 17 g by mouth 2 (two) times daily. What changed:   when to take this  reasons to take this   potassium chloride SA 20 MEQ tablet Commonly known as: KLOR-CON Take 20 mEq by mouth daily.   torsemide 20 MG tablet Commonly known as: DEMADEX Take 1 tablet (20 mg total) by mouth 2 (two) times daily.   Vitamin D 50 MCG (2000 UT) Caps Take 2,000 Units by mouth daily.   Vitamin E 180 MG Caps Take 180 mg by mouth daily.       Follow-up Information    Schnier, Dolores Lory, MD Follow up in 1 month(s).   Specialties: Vascular Surgery, Cardiology, Radiology, Vascular Surgery Why: Can see Schnier or Arna Medici.  Will need ABI with visit. Contact information: [place] [address] [phone]              Allergies  Allergen Reactions  . Iodine Anaphylaxis  . Penicillins Anaphylaxis    Tolerates ceftriaxone, cefazolin Did it involve swelling of the face/tongue/throat, SOB, or low BP? Unknown Did it involve sudden or severe rash/hives, skin peeling, or any reaction on the inside of your mouth or nose? Unknown Did you need to seek medical attention at a hospital or doctor's office? Unknown When did it last happen?Unknown If all above answers are "NO", may proceed with cephalosporin use.   . Shellfish Allergy Anaphylaxis  . Sulfa Antibiotics Anaphylaxis  . Sulfacetamide Sodium Anaphylaxis  . Sulfasalazine Anaphylaxis  . Eggs Or Egg-Derived Products     Other reaction(s): SHORTNESS OF BREATH  . Morphine And Related Other (See Comments)    Altered mental status    Consultations: Vascular surgery  Discharge Exam: BP (!) 144/52 (BP Location: Right Arm)   Pulse 86   Temp (!) 97.4 F (36.3 C) (Oral)   Resp 18   Ht 5\' 8"  (1.727 m)   Wt 66.2 kg   SpO2 97%   BMI 22.19 kg/m  General appearance: Remains more awake and  alert.  Complaining of pain in her feet which is chronic. Head: Normocephalic, without obvious abnormality, atraumatic Eyes: EOMI Lungs: clear to auscultation bilaterally Heart: regular rate and rhythm and S1, S2 normal Abdomen: Soft, nontender, nondistended, bowel sounds present Extremities: Right foot noted with dark discoloration on dorsum of foot.  First digit noted to be fixed and unable to passively move. Skin: warm, dry Neurologic: Moves all 4 extremities to command  The results of significant diagnostics from this hospitalization (including imaging, microbiology, ancillary and laboratory) are listed below for reference.   Microbiology: Recent Results (from the past  240 hour(s))  Resp Panel by RT-PCR (Flu A&B, Covid) Nasopharyngeal Swab     Status: None   Collection Time: 10/19/20  3:30 PM   Specimen: Nasopharyngeal Swab; Nasopharyngeal(NP) swabs in vial transport medium  Result Value Ref Range Status   SARS Coronavirus 2 by RT PCR NEGATIVE NEGATIVE Final    Comment: (NOTE) SARS-CoV-2 target nucleic acids are NOT DETECTED.  The SARS-CoV-2 RNA is generally detectable in upper respiratory specimens during the acute phase of infection. The lowest concentration of SARS-CoV-2 viral copies this assay can detect is 138 copies/mL. A negative result does not preclude SARS-Cov-2 infection and should not be used as the sole basis for treatment or other patient management decisions. A negative result may occur with  improper specimen collection/handling, submission of specimen other than nasopharyngeal swab, presence of viral mutation(s) within the areas targeted by this assay, and inadequate number of viral copies(<138 copies/mL). A negative result must be combined with clinical observations, patient history, and epidemiological information. The expected result is Negative.  Fact Sheet for Patients:  EntrepreneurPulse.com.au  Fact Sheet for Healthcare Providers:   IncredibleEmployment.be  This test is no t yet approved or cleared by the [place] FDA and  has been authorized for detection and/or diagnosis of SARS-CoV-2 by FDA under an Emergency Use Authorization (EUA). This EUA will remain  in effect (meaning this test can be used) for the duration of the COVID-19 declaration under Section 564(b)(1) of the Act, 21 U.S.C.section 360bbb-3(b)(1), unless the authorization is terminated  or revoked sooner.       Influenza A by PCR NEGATIVE NEGATIVE Final   Influenza B by PCR NEGATIVE NEGATIVE Final    Comment: (NOTE) The Xpert Xpress SARS-CoV-2/FLU/RSV plus assay is intended as an aid in the diagnosis of influenza from Nasopharyngeal swab specimens and should not be used as a sole basis for treatment. Nasal washings and aspirates are unacceptable for Xpert Xpress SARS-CoV-2/FLU/RSV testing.  Fact Sheet for Patients: EntrepreneurPulse.com.au  Fact Sheet for Healthcare Providers: IncredibleEmployment.be  This test is not yet approved or cleared by the [place] FDA and has been authorized for detection and/or diagnosis of SARS-CoV-2 by FDA under an Emergency Use Authorization (EUA). This EUA will remain in effect (meaning this test can be used) for the duration of the COVID-19 declaration under Section 564(b)(1) of the Act, 21 U.S.C. section 360bbb-3(b)(1), unless the authorization is terminated or revoked.  Performed at [facility], [address], [place], [place] [address]   Urine Culture     Status: Abnormal   Collection Time: 10/19/20  3:30 PM   Specimen: Urine, Random  Result Value Ref Range Status   Specimen Description   Final    URINE, RANDOM Performed at [facility], [address] [name], [place] [address]    Special Requests   Final    NONE Performed at [facility], [address] [place], [place] [address]    Culture (A)   Final    >=100,000 COLONIES/mL ESCHERICHIA COLI Confirmed Extended Spectrum Beta-Lactamase Producer (ESBL).  In bloodstream infections from ESBL organisms, carbapenems are preferred over piperacillin/tazobactam. They are shown to have a lower risk of mortality.    Report Status 10/22/2020 FINAL  Final   Organism ID, Bacteria ESCHERICHIA COLI (A)  Final      Susceptibility   Escherichia coli - MIC*    AMPICILLIN >=32 RESISTANT Resistant     CEFAZOLIN >=64 RESISTANT Resistant     CEFEPIME >=32 RESISTANT Resistant  CEFTRIAXONE >=64 RESISTANT Resistant     CIPROFLOXACIN >=4 RESISTANT Resistant     GENTAMICIN <=1 SENSITIVE Sensitive     IMIPENEM <=0.25 SENSITIVE Sensitive     NITROFURANTOIN <=16 SENSITIVE Sensitive     TRIMETH/SULFA >=320 RESISTANT Resistant     AMPICILLIN/SULBACTAM >=32 RESISTANT Resistant     PIP/TAZO 64 INTERMEDIATE Intermediate     * >=100,000 COLONIES/mL ESCHERICHIA COLI  CULTURE, BLOOD (ROUTINE X 2) w Reflex to ID Panel     Status: None (Preliminary result)   Collection Time: 10/19/20  6:39 PM   Specimen: BLOOD  Result Value Ref Range Status   Specimen Description BLOOD BLOOD LEFT HAND  Final   Special Requests   Final    BOTTLES DRAWN AEROBIC AND ANAEROBIC Blood Culture results may not be optimal due to an inadequate volume of blood received in culture bottles   Culture   Final    NO GROWTH 4 DAYS Performed at [facility], [address] [place], [place] [address]    Report Status PENDING  Incomplete  CULTURE, BLOOD (ROUTINE X 2) w Reflex to ID Panel     Status: None (Preliminary result)   Collection Time: 10/19/20  6:41 PM   Specimen: BLOOD  Result Value Ref Range Status   Specimen Description BLOOD BLOOD RIGHT HAND  Final   Special Requests   Final    BOTTLES DRAWN AEROBIC AND ANAEROBIC Blood Culture results may not be optimal due to an inadequate volume of blood received in culture bottles   Culture   Final    NO GROWTH 4 DAYS Performed at  [facility], [address], [place], [place] [address]    Report Status PENDING  Incomplete  MRSA PCR Screening     Status: None   Collection Time: 10/20/20  2:35 AM   Specimen: Nasopharyngeal  Result Value Ref Range Status   MRSA by PCR NEGATIVE NEGATIVE Final    Comment:        The GeneXpert MRSA Assay (FDA approved for NASAL specimens only), is one component of a comprehensive MRSA colonization surveillance program. It is not intended to diagnose MRSA infection nor to guide or monitor treatment for MRSA infections. Performed at [facility], [address] [place],  [address]      Labs: BNP (last 3 results) Recent Labs    04/01/20 0615  BNP 213.0*   Basic Metabolic Panel: Recent Labs  Lab 10/19/20 1204 10/20/20 0512 10/21/20 0517 10/22/20 0540 10/23/20 0400  NA 137 138 140 137 138  K 4.0 3.5 3.7 3.8 3.8  CL 103 106 111 109 109  CO2 25 26 19* 23 20*  GLUCOSE 237* 152* 213* 167* 163*  BUN 21 18 16 16 10   CREATININE 0.60 0.46 0.62 0.50 0.44  CALCIUM 9.1 9.2 8.9 9.0 8.9  MG  --  2.4 2.5* 2.2 2.1   Liver Function Tests: Recent Labs  Lab 10/19/20 1204  AST 30  ALT 36  ALKPHOS 57  BILITOT 0.8  PROT 6.0*  ALBUMIN 3.3*   No results for input(s): LIPASE, AMYLASE in the last 168 hours. No results for input(s): AMMONIA in the last 168 hours. CBC: Recent Labs  Lab 10/19/20 1204 10/20/20 0512 10/21/20 0517 10/22/20 0540 10/23/20 0400  WBC 12.8* 10.3 13.3* 10.6* 10.3  NEUTROABS 8.9*  --  11.9* 7.5 7.1  HGB 12.1 11.1* 11.6* 9.7* 10.5*  HCT 38.3 35.2* 36.7 30.6* 32.8*  MCV 85.1 85.2 85.2 84.8 84.3  PLT 240 208 232 209 212   Cardiac Enzymes: No results for input(s): CKTOTAL, CKMB, CKMBINDEX, TROPONINI in the last 168 hours. BNP: Invalid input(s): POCBNP CBG: Recent Labs  Lab 10/22/20 1207 10/22/20 1702 10/22/20 2044 10/23/20 0806 10/23/20 1203  GLUCAP 135* 146* 140* 182* 249*   D-Dimer No results for input(s):  DDIMER in the last 72 hours. Hgb A1c No results for input(s): HGBA1C in the last 72 hours. Lipid Profile No results for input(s): CHOL, HDL, LDLCALC, TRIG, CHOLHDL, LDLDIRECT in the last 72 hours. Thyroid function studies No results for input(s): TSH, T4TOTAL, T3FREE, THYROIDAB in the last 72 hours.  Invalid input(s): FREET3 Anemia work up No results for input(s): VITAMINB12, FOLATE, FERRITIN, TIBC, IRON, RETICCTPCT in the last 72 hours. Urinalysis    Component Value Date/Time   COLORURINE YELLOW (A) 10/19/2020 1530   APPEARANCEUR CLOUDY (A) 10/19/2020 1530   LABSPEC 1.011 10/19/2020 1530   PHURINE 7.0 10/19/2020 1530   GLUCOSEU NEGATIVE 10/19/2020 1530   HGBUR SMALL (A) 10/19/2020 1530   BILIRUBINUR NEGATIVE 10/19/2020 1530   KETONESUR NEGATIVE 10/19/2020 1530   PROTEINUR NEGATIVE 10/19/2020 1530   UROBILINOGEN 0.2 02/12/2014 0110   NITRITE POSITIVE (A) 10/19/2020 1530   LEUKOCYTESUR LARGE (A) 10/19/2020 1530   Sepsis Labs Invalid input(s): PROCALCITONIN,  WBC,  LACTICIDVEN Microbiology Recent Results (from the past 240 hour(s))  Resp Panel by RT-PCR (Flu A&B, Covid) Nasopharyngeal Swab     Status: None   Collection Time: 10/19/20  3:30 PM   Specimen: Nasopharyngeal Swab; Nasopharyngeal(NP) swabs in vial transport medium  Result Value Ref Range Status   SARS Coronavirus 2 by RT PCR NEGATIVE NEGATIVE Final    Comment: (NOTE) SARS-CoV-2 target nucleic acids are NOT DETECTED.  The SARS-CoV-2 RNA is generally detectable in upper respiratory specimens during the acute phase of infection. The lowest concentration of SARS-CoV-2 viral copies this assay can detect is 138 copies/mL. A negative result does not preclude SARS-Cov-2 infection and should not be used as the sole basis for treatment or other patient management decisions. A negative result may occur with  improper specimen collection/handling, submission of specimen other than nasopharyngeal swab, presence of viral  mutation(s) within the areas targeted by this assay, and inadequate number of viral copies(<138 copies/mL). A negative result must be combined with clinical observations, patient history, and epidemiological information. The expected result is Negative.  Fact Sheet for Patients:  EntrepreneurPulse.com.au  Fact Sheet for Healthcare Providers:  IncredibleEmployment.be  This test is no t yet approved or cleared by the [place] FDA and  has been authorized for detection and/or diagnosis of SARS-CoV-2 by FDA under an Emergency Use Authorization (EUA). This EUA will remain  in effect (meaning this test can be used) for the duration of the COVID-19 declaration under Section 564(b)(1) of the Act, 21 U.S.C.section 360bbb-3(b)(1), unless the authorization is terminated  or revoked sooner.       Influenza A by PCR NEGATIVE NEGATIVE Final   Influenza B by PCR NEGATIVE NEGATIVE Final    Comment: (NOTE) The Xpert Xpress SARS-CoV-2/FLU/RSV plus assay is intended as an aid in the diagnosis of influenza from Nasopharyngeal swab specimens and should not be used as a sole basis for treatment. Nasal washings and aspirates are unacceptable for Xpert Xpress SARS-CoV-2/FLU/RSV testing.  Fact Sheet for Patients: EntrepreneurPulse.com.au  Fact Sheet for Healthcare Providers: IncredibleEmployment.be  This test is not yet approved or cleared by the [place] FDA and has been authorized for detection and/or diagnosis of SARS-CoV-2  by FDA under an Emergency Use Authorization (EUA). This EUA will remain in effect (meaning this test can be used) for the duration of the COVID-19 declaration under Section 564(b)(1) of the Act, 21 U.S.C. section 360bbb-3(b)(1), unless the authorization is terminated or revoked.  Performed at [facility], [address], [place], [place] [address]   Urine Culture     Status:  Abnormal   Collection Time: 10/19/20  3:30 PM   Specimen: Urine, Random  Result Value Ref Range Status   Specimen Description   Final    URINE, RANDOM Performed at [facility], [address], [place], [place] [address]    Special Requests   Final    NONE Performed at [facility], [address] [place], [place] [address]    Culture (A)  Final    >=100,000 COLONIES/mL ESCHERICHIA COLI Confirmed Extended Spectrum Beta-Lactamase Producer (ESBL).  In bloodstream infections from ESBL organisms, carbapenems are preferred over piperacillin/tazobactam. They are shown to have a lower risk of mortality.    Report Status 10/22/2020 FINAL  Final   Organism ID, Bacteria ESCHERICHIA COLI (A)  Final      Susceptibility   Escherichia coli - MIC*    AMPICILLIN >=32 RESISTANT Resistant     CEFAZOLIN >=64 RESISTANT Resistant     CEFEPIME >=32 RESISTANT Resistant     CEFTRIAXONE >=64 RESISTANT Resistant     CIPROFLOXACIN >=4 RESISTANT Resistant     GENTAMICIN <=1 SENSITIVE Sensitive     IMIPENEM <=0.25 SENSITIVE Sensitive     NITROFURANTOIN <=16 SENSITIVE Sensitive     TRIMETH/SULFA >=320 RESISTANT Resistant     AMPICILLIN/SULBACTAM >=32 RESISTANT Resistant     PIP/TAZO 64 INTERMEDIATE Intermediate     * >=100,000 COLONIES/mL ESCHERICHIA COLI  CULTURE, BLOOD (ROUTINE X 2) w Reflex to ID Panel     Status: None (Preliminary result)   Collection Time: 10/19/20  6:39 PM   Specimen: BLOOD  Result Value Ref Range Status   Specimen Description BLOOD BLOOD LEFT HAND  Final   Special Requests   Final    BOTTLES DRAWN AEROBIC AND ANAEROBIC Blood Culture results may not be optimal due to an inadequate volume of blood received in culture bottles   Culture   Final    NO GROWTH 4 DAYS Performed at [facility], [address], [place], [place] [address]    Report Status PENDING  Incomplete  CULTURE, BLOOD (ROUTINE X 2) w Reflex to ID Panel     Status: None (Preliminary  result)   Collection Time: 10/19/20  6:41 PM   Specimen: BLOOD  Result Value Ref Range Status   Specimen Description BLOOD BLOOD RIGHT HAND  Final   Special Requests   Final    BOTTLES DRAWN AEROBIC AND ANAEROBIC Blood Culture results may not be optimal due to an inadequate volume of blood received in culture bottles   Culture   Final    NO GROWTH 4 DAYS Performed at [facility], [address] [place], [place] [address]    Report Status PENDING  Incomplete  MRSA PCR Screening     Status: None   Collection Time: 10/20/20  2:35 AM   Specimen: Nasopharyngeal  Result Value Ref Range Status   MRSA by PCR NEGATIVE NEGATIVE Final    Comment:        The GeneXpert MRSA Assay (FDA approved for NASAL specimens only), is one component of a comprehensive MRSA colonization surveillance program. It is  not intended to diagnose MRSA infection nor to guide or monitor treatment for MRSA infections. Performed at [facility], [address], [place], [place] [address]     Procedures/Studies: CT Head Wo Contrast  Result Date: 10/19/2020 CLINICAL DATA:  Mental status change. Right foot discoloration. No reported injury. EXAM: CT HEAD WITHOUT CONTRAST TECHNIQUE: Contiguous axial images were obtained from the base of the skull through the vertex without intravenous contrast. COMPARISON:  06/04/2019 head CT. FINDINGS: Brain: Generalized cerebral volume loss. Stable mild right parietal encephalomalacia. Stable calcified extra-axial 1.1 x 1.1 cm mass in the inferior right posterior fossa (series 3/image 4), compatible with meningioma. No evidence of parenchymal hemorrhage or extra-axial fluid collection. No midline shift. No CT evidence of acute infarction. Nonspecific moderate subcortical and periventricular white matter hypodensity, most in keeping with chronic small vessel ischemic change. Cerebral ventricle sizes are stable and concordant with the degree of cerebral volume loss.  Vascular: No acute abnormality. Skull: No evidence of calvarial fracture. Sinuses/Orbits: The visualized paranasal sinuses are essentially clear. Other:  The mastoid air cells are unopacified. IMPRESSION: 1. No evidence of acute intracranial abnormality. 2. Generalized cerebral volume loss and moderate chronic small vessel ischemic changes in the cerebral white matter. 3. Chronic mild right parietal encephalomalacia. 4. 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 Electronically Signed   By: Ilona Sorrel M.D.   On: 10/19/2020 16:00   PERIPHERAL VASCULAR CATHETERIZATION  Result Date: 10/20/2020 See Op Note  DG Foot Complete Right  Result Date: 10/19/2020 CLINICAL DATA:  Pain and discoloration EXAM: RIGHT FOOT COMPLETE - 3+ VIEW COMPARISON:  April 10, 2018. FINDINGS: Frontal, oblique, and lateral views were obtained. There is soft tissue swelling. Bones are markedly osteoporotic. No appreciable acute fracture or dislocation. There is fusion of the first MTP joint. Other joint spaces appear unremarkable. No erosive change or bony destruction. There are multiple foci of arterial vascular calcification. IMPRESSION: Soft tissue swelling. Marked osteoporosis. No acute fracture or dislocation. No erosive change or bony destruction. Fusion first MTP joint, stable. Foci of arterial vascular calcification noted. Electronically Signed   By: Lowella Grip III M.D.   On: 10/19/2020 14:37     Time coordinating discharge: Over 30 minutes    [name] Dee, MD  [facility] 10/23/2020, 3:41 PM

## 2020-10-23 NOTE — Progress Notes (Signed)
[name] to be D/C'd home per MD order.  Discussed prescriptions and follow up appointments with the patient's husband. Prescriptions given to patient's husband, medication list explained in detail.   Allergies as of 10/23/2020      Reactions   Iodine Anaphylaxis   Penicillins Anaphylaxis   Tolerates ceftriaxone, cefazolin Did it involve swelling of the face/tongue/throat, SOB, or low BP? Unknown Did it involve sudden or severe rash/hives, skin peeling, or any reaction on the inside of your mouth or nose? Unknown Did you need to seek medical attention at a hospital or doctor's office? Unknown When did it last happen?Unknown If all above answers are "NO", may proceed with cephalosporin use.   Shellfish Allergy Anaphylaxis   Sulfa Antibiotics Anaphylaxis   Sulfacetamide Sodium Anaphylaxis   Sulfasalazine Anaphylaxis   Eggs Or Egg-derived Products    Other reaction(s): SHORTNESS OF BREATH   Morphine And Related Other (See Comments)   Altered mental status      Medication List    STOP taking these medications   gabapentin 300 MG capsule Commonly known as: NEURONTIN   oxyCODONE-acetaminophen 5-325 MG tablet Commonly known as: PERCOCET/ROXICET     TAKE these medications   acetaminophen 500 MG tablet Commonly known as: TYLENOL Take 1,000 mg by mouth every 6 (six) hours as needed for mild pain.   amLODipine 10 MG tablet Commonly known as: NORVASC Take 10 mg by mouth daily.   aspirin 81 MG EC tablet Take 1 tablet (81 mg total) by mouth daily.   atorvastatin 20 MG tablet Commonly known as: LIPITOR Take 1 tablet (20 mg total) by mouth daily. What changed: when to take this   budesonide 0.25 MG/2ML nebulizer solution Commonly known as: PULMICORT Take 2 mLs (0.25 mg total) by nebulization 2 (two) times daily.   carvedilol 6.25 MG tablet Commonly known as: COREG Take 6.25 mg by mouth 2 (two) times daily with a meal.   cetirizine 10 MG tablet Commonly known as:  ZYRTEC Take 10 mg by mouth daily.   clindamycin 150 MG capsule Commonly known as: CLEOCIN Take 1 capsule (150 mg total) by mouth 4 (four) times daily for 4 days.   clopidogrel 75 MG tablet Commonly known as: PLAVIX Take 1 tablet (75 mg total) by mouth daily with breakfast.   docusate sodium 100 MG capsule Commonly known as: COLACE Take 100 mg by mouth 2 (two) times daily.   fluticasone 50 MCG/ACT nasal spray Commonly known as: FLONASE Place 1 spray into both nostrils daily as needed for allergies.   hydrALAZINE 25 MG tablet Commonly known as: APRESOLINE Take 1 tablet (25 mg total) by mouth every 8 (eight) hours. What changed: when to take this   insulin glargine 100 UNIT/ML injection Commonly known as: LANTUS Inject 0.15 mLs (15 Units total) into the skin at bedtime. What changed: how much to take   magnesium oxide 400 MG tablet Commonly known as: MAG-OX Take 400 mg by mouth 2 (two) times daily.   modafinil 200 MG tablet Commonly known as: PROVIGIL Take 200 mg by mouth daily.   omeprazole 20 MG capsule Commonly known as: PRILOSEC Take 1 capsule (20 mg total) by mouth daily.   polyethylene glycol 17 g packet Commonly known as: MIRALAX / GLYCOLAX Take 17 g by mouth 2 (two) times daily. What changed:   when to take this  reasons to take this   potassium chloride SA 20 MEQ tablet Commonly known as: KLOR-CON Take 20 mEq by mouth  daily.   torsemide 20 MG tablet Commonly known as: DEMADEX Take 1 tablet (20 mg total) by mouth 2 (two) times daily.   Vitamin D 50 MCG (2000 UT) Caps Take 2,000 Units by mouth daily.   Vitamin E 180 MG Caps Take 180 mg by mouth daily.       Vitals:   10/23/20 0822 10/23/20 1204  BP: (!) 183/66 (!) 144/52  Pulse: 89 86  Resp: 19 18  Temp: (!) 97.5 F (36.4 C) (!) 97.4 F (36.3 C)  SpO2: 99% 97%    Skin clean, dry and intact without evidence of skin break down, no evidence of skin tears noted. IV catheter discontinued  intact. Site without signs and symptoms of complications. Dressing and pressure applied. Pt denies pain at this time. No complaints noted.  An After Visit Summary was printed and given to the patient. Patient escorted via stretcher via EMS.  Fuller Mandril, RN

## 2020-10-23 NOTE — Care Management Important Message (Signed)
Important Message  Patient Details  Name: KIMBA LOTTES MRN: [id number] Date of Birth: [birth year]   Medicare Important Message Given:  Other (see comment)  Attempted to reach patient/spouse via phone to review Medicare IM.  No answer in patient's room or with patient's home number.  Attempted to reach spouse via cell though mailbox was full and unable to leave message.     Dannette Barbara 10/23/2020, 1:36 PM

## 2020-10-23 NOTE — TOC Transition Note (Signed)
Transition of Care [facility]) - CM/SW Discharge Note   Patient Details  Name: DATRA CLARY MRN: [id number] Date of Birth: 04/30/1941  Transition of Care [facility]) CM/SW Contact:  Eileen Stanford, LCSW Phone Number: 10/23/2020, 1:20 PM   Clinical Narrative:   [place] arranged via [facility]. Pt needs ambulance pick up. Arranged via [facility]. Pickup scheduled for 2:30--RN aware.    Final next level of care: Home w Home Health Services Barriers to Discharge: No Barriers Identified   Patient Goals and CMS Choice     Choice offered to / list presented to : Patient  Discharge Placement                Patient to be transferred to facility by: [facility] Name of family member notified: spouse at bedside Patient and family notified of of transfer: 10/23/20  Discharge Plan and Services   Discharge Planning Services: CM Consult Post Acute Care Choice: Home Health                    HH Arranged: RN,PT,OT [place] Agency: [facility] ([facility] [employer]) Date [place]: 10/23/20 Time [place]: 1319 Representative spoke with at [place]: teresa  Social Determinants of Health ([place]) Interventions     Readmission Risk Interventions Readmission Risk Prevention Plan 12/24/2018  [place] or Home Care Consult Complete  Some recent data might be hidden

## 2020-10-24 LAB — CULTURE, BLOOD (ROUTINE X 2)
Culture: NO GROWTH
Culture: NO GROWTH

## 2020-10-25 ENCOUNTER — Ambulatory Visit: Payer: Medicare HMO | Admitting: Physical Therapy

## 2020-10-30 ENCOUNTER — Ambulatory Visit: Payer: Medicare HMO | Admitting: Physical Therapy

## 2020-11-01 ENCOUNTER — Ambulatory Visit: Payer: Medicare HMO | Admitting: Physical Therapy

## 2020-11-21 ENCOUNTER — Other Ambulatory Visit (INDEPENDENT_AMBULATORY_CARE_PROVIDER_SITE_OTHER): Payer: Self-pay | Admitting: Vascular Surgery

## 2020-11-21 DIAGNOSIS — I70261 Atherosclerosis of native arteries of extremities with gangrene, right leg: Secondary | ICD-10-CM

## 2020-11-21 DIAGNOSIS — Z95820 Peripheral vascular angioplasty status with implants and grafts: Secondary | ICD-10-CM

## 2020-11-23 ENCOUNTER — Encounter (INDEPENDENT_AMBULATORY_CARE_PROVIDER_SITE_OTHER): Payer: Medicare HMO

## 2020-11-23 ENCOUNTER — Ambulatory Visit (INDEPENDENT_AMBULATORY_CARE_PROVIDER_SITE_OTHER): Payer: Medicare HMO | Admitting: Nurse Practitioner

## 2020-11-24 ENCOUNTER — Ambulatory Visit (INDEPENDENT_AMBULATORY_CARE_PROVIDER_SITE_OTHER): Payer: Medicare HMO | Admitting: Nurse Practitioner

## 2020-11-24 ENCOUNTER — Other Ambulatory Visit: Payer: Self-pay

## 2020-11-24 ENCOUNTER — Encounter (INDEPENDENT_AMBULATORY_CARE_PROVIDER_SITE_OTHER): Payer: Self-pay | Admitting: Nurse Practitioner

## 2020-11-24 ENCOUNTER — Ambulatory Visit (INDEPENDENT_AMBULATORY_CARE_PROVIDER_SITE_OTHER): Payer: Medicare HMO

## 2020-11-24 VITALS — BP 143/79 | HR 73 | Ht 68.0 in

## 2020-11-24 DIAGNOSIS — Z95820 Peripheral vascular angioplasty status with implants and grafts: Secondary | ICD-10-CM

## 2020-11-24 DIAGNOSIS — I96 Gangrene, not elsewhere classified: Secondary | ICD-10-CM | POA: Diagnosis not present

## 2020-11-24 DIAGNOSIS — I739 Peripheral vascular disease, unspecified: Secondary | ICD-10-CM | POA: Diagnosis not present

## 2020-11-24 DIAGNOSIS — I70261 Atherosclerosis of native arteries of extremities with gangrene, right leg: Secondary | ICD-10-CM

## 2020-11-24 DIAGNOSIS — I10 Essential (primary) hypertension: Secondary | ICD-10-CM

## 2020-11-25 ENCOUNTER — Encounter (INDEPENDENT_AMBULATORY_CARE_PROVIDER_SITE_OTHER): Payer: Self-pay | Admitting: Nurse Practitioner

## 2020-11-25 NOTE — Progress Notes (Signed)
Subjective:    Patient ID: [name], female    DOB: 13-Mar-1941, 80 y.o.   MRN: [id number] Chief Complaint  Patient presents with  . Follow-up    1 mo U/S follow up    She presents today following angiogram after recent hospitalization due to discoloration of her toes.  Today the toes on the right lower extremity have improved, although the great toe still has necrotic changes.  The patient denies any foul-smelling drainage or pain from her foot.  Her husband is also present to help with history.  He notes that she has had some flaking of skin from the right foot but no new open wounds and ulcers.  He notes that she has been doing well since her recent hospitalization.  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  Today noninvasive studies show an ABI 0.88 on the right with an ABI 0.50 on the left.  These were both decreased from previous studies done on 11/22/2019.  Previously the right ABI was 1.06 with a left of 0.87.  The patient has monophasic tibial artery waveforms bilaterally.   Review of Systems  Skin: Positive for wound.  Neurological: Positive for weakness.  All other systems reviewed and are negative.      Objective:   Physical Exam Vitals reviewed.  HENT:     Head: Normocephalic.  Cardiovascular:     Rate and Rhythm: Normal rate.     Pulses: Decreased pulses.  Pulmonary:     Effort: Pulmonary effort is normal.  Skin:    General: Skin is warm and dry.  Neurological:     Mental Status: She is alert and oriented to person, place, and time. Mental status is at baseline.     Motor: Weakness present.     Gait: Gait abnormal.  Psychiatric:        Mood and Affect: Mood normal.        Behavior: Behavior normal.        Thought Content: Thought content normal.        Judgment: Judgment normal.     BP (!) 143/79   Pulse 73   Ht 5\' 8"  (1.727 m)   BMI 22.19 kg/m   Past Medical History:  Diagnosis Date  . Acute  heart failure ([place])   . Aortic stenosis   . CHF (congestive heart failure) ([place])   . Complication of anesthesia    Hard to wake patient up after having anesthesia  . Diabetes mellitus without complication ([place])   . Diabetic neuropathy (HCC)    Takes Lyrica  . Edema of both legs    Takes Lasix  . GERD (gastroesophageal reflux disease)   . High cholesterol   . HTN (hypertension)   . Hypertension   . PAD (peripheral artery disease) ([place])   . Shortness of breath dyspnea   . Spasm of back muscles   . Stroke ([place])   . Wound, open    Right great toe    Social History   Socioeconomic History  . Marital status: Married    Spouse name: Not on file  . Number of children: Not on file  . Years of education: Not on file  . Highest education level: Not on file  Occupational History  . Not on file  Tobacco Use  . Smoking status: Never Smoker  . Smokeless tobacco: Never Used  . Tobacco comment: Never smoked  Substance and Sexual Activity  .  Alcohol use: No    Alcohol/week: 0.0 standard drinks  . Drug use: No  . Sexual activity: Never  Other Topics Concern  . Not on file  Social History Narrative  . Not on file   Social Determinants of Health   Financial Resource Strain: Not on file  Food Insecurity: Not on file  Transportation Needs: Not on file  Physical Activity: Not on file  Stress: Not on file  Social Connections: Not on file  Intimate Partner Violence: Not on file    Past Surgical History:  Procedure Laterality Date  . AMPUTATION TOE Left 06/09/2019   Procedure: Left third toe and partial great toe amputation and debridement;  Surgeon: Katha Cabal, MD;  Location: [facility];  Service: Vascular;  Laterality: Left;  . AMPUTATION TOE Left 04/06/2020   Procedure: AMPUTATION LEFT SECOND TOE;  Surgeon: Samara Deist, DPM;  Location: [facility];  Service: Podiatry;  Laterality: Left;  . BYPASS GRAFT POPLITEAL TO TIBIAL Right 02/28/2016   Procedure: BYPASS GRAFT RIGHT  BELOW KNEE POPLITEAL TO PERONEAL USING REVERSED RIGHT GREATER SAPHENOUS VEIN;  Surgeon: Conrad [place], MD;  Location: [place];  Service: Vascular;  Laterality: Right;  . CYST EXCISION     Abdomen  . EYE SURGERY Bilateral    Cataract removal  . INTRAMEDULLARY (IM) NAIL INTERTROCHANTERIC Left 02/04/2014   Procedure: INTRAMEDULLARY (IM) NAIL INTERTROCHANTRIC FEMORAL;  Surgeon: Mauri Pole, MD;  Location: [place];  Service: Orthopedics;  Laterality: Left;  . IR GENERIC HISTORICAL  03/01/2016   IR ANGIO INTRA EXTRACRAN SEL COM CAROTID INNOMINATE UNI R MOD SED 03/01/2016 [name], MD [facility]  . IR GENERIC HISTORICAL  03/01/2016   IR ENDOVASC INTRACRANIAL INF OTHER THAN THROMBO ART INC DIAG ANGIO 03/01/2016 [name], MD [facility]  . IR GENERIC HISTORICAL  03/01/2016   IR INTRAVSC STENT CERV CAROTID W/O EMB-PROT MOD SED INC ANGIO 03/01/2016 [name], MD [facility]  . IR GENERIC HISTORICAL  06/03/2016   IR RADIOLOGIST EVAL & MGMT 06/03/2016 [facility]  . LOWER EXTREMITY ANGIOGRAPHY Left 02/09/2019   Procedure: LOWER EXTREMITY ANGIOGRAPHY;  Surgeon: Katha Cabal, MD;  Location: [place] [facility];  Service: Cardiovascular;  Laterality: Left;  . LOWER EXTREMITY ANGIOGRAPHY Left 03/10/2019   Procedure: LOWER EXTREMITY ANGIOGRAPHY;  Surgeon: Katha Cabal, MD;  Location: [place] [facility];  Service: Cardiovascular;  Laterality: Left;  . LOWER EXTREMITY ANGIOGRAPHY Left 04/27/2019   Procedure: LOWER EXTREMITY ANGIOGRAPHY;  Surgeon: Katha Cabal, MD;  Location: [place] [facility];  Service: Cardiovascular;  Laterality: Left;  . LOWER EXTREMITY ANGIOGRAPHY Left 06/08/2019   Procedure: Lower Extremity Angiography;  Surgeon: Katha Cabal, MD;  Location: [place] [facility];  Service: Cardiovascular;  Laterality: Left;  . LOWER EXTREMITY ANGIOGRAPHY Left 04/03/2020   Procedure: Lower Extremity Angiography;  Surgeon: Algernon Huxley, MD;  Location: [place] [facility];  Service: Cardiovascular;  Laterality: Left;  . LOWER EXTREMITY ANGIOGRAPHY Right 10/20/2020   Procedure: Lower Extremity Angiography;  Surgeon: Katha Cabal, MD;  Location: [place] [facility];  Service: Cardiovascular;  Laterality: Right;  . ORIF TOE FRACTURE Right 02/08/2014   Procedure: OPEN REDUCTION INTERNAL FIXATION Right METATARSAL  FRACTURE ;  Surgeon: Wylene Simmer, MD;  Location: [place];  Service: Orthopedics;  Laterality: Right;  . PERIPHERAL VASCULAR CATHETERIZATION N/A 12/28/2015   Procedure: Abdominal Aortogram w/Lower Extremity;  Surgeon: Conrad [place], MD;  Location: [place] [facility];  Service:  Cardiovascular;  Laterality: N/A;  . RADIOLOGY WITH ANESTHESIA N/A 03/01/2016   Procedure: RADIOLOGY WITH ANESTHESIA;  Surgeon: [name], MD;  Location: [place];  Service: Radiology;  Laterality: N/A;  . VEIN HARVEST Right 02/28/2016   Procedure: RIGHT GREATER SAPHENOUS VEIN HARVEST;  Surgeon: Conrad [place], MD;  Location: [place];  Service: Vascular;  Laterality: Right;    Family History  Problem Relation Age of Onset  . Diabetes Other   . Liver disease Mother   . CVA Father   . Diabetes Father   . Hypertension Father     Allergies  Allergen Reactions  . Iodine Anaphylaxis  . Penicillins Anaphylaxis    Tolerates ceftriaxone, cefazolin Did it involve swelling of the face/tongue/throat, SOB, or low BP? Unknown Did it involve sudden or severe rash/hives, skin peeling, or any reaction on the inside of your mouth or nose? Unknown Did you need to seek medical attention at a hospital or doctor's office? Unknown When did it last happen?Unknown If all above answers are "NO", may proceed with cephalosporin use.   . Shellfish Allergy Anaphylaxis  . Sulfa Antibiotics Anaphylaxis  . Sulfacetamide Sodium Anaphylaxis  . Sulfasalazine Anaphylaxis  . Eggs Or Egg-Derived Products     Other reaction(s): SHORTNESS OF BREATH  . Morphine And Related Other (See  Comments)    Altered mental status    CBC Latest Ref Rng & Units 10/23/2020 10/22/2020 10/21/2020  WBC 4.0 - 10.5 K/uL 10.3 10.6(H) 13.3(H)  Hemoglobin 12.0 - 15.0 g/dL 10.5(L) 9.7(L) 11.6(L)  Hematocrit 36.0 - 46.0 % 32.8(L) 30.6(L) 36.7  Platelets 150 - 400 K/uL 212 209 232      CMP     Component Value Date/Time   NA 138 10/23/2020 0400   K 3.8 10/23/2020 0400   CL 109 10/23/2020 0400   CO2 20 (L) 10/23/2020 0400   GLUCOSE 163 (H) 10/23/2020 0400   BUN 10 10/23/2020 0400   CREATININE 0.44 10/23/2020 0400   CALCIUM 8.9 10/23/2020 0400   PROT 6.0 (L) 10/19/2020 1204   ALBUMIN 3.3 (L) 10/19/2020 1204   AST 30 10/19/2020 1204   ALT 36 10/19/2020 1204   ALKPHOS 57 10/19/2020 1204   BILITOT 0.8 10/19/2020 1204   GFRNONAA >60 10/23/2020 0400   GFRAA >60 04/06/2020 0535     No results found.     Assessment & Plan:   1. Necrotic toes (HCC) The gangrenous changes have greatly improved since her hospitalization however the right great toe still has some necrotic appearance.  She currently does not have any drainage or foul-smelling odor from the wound.  At this time we will not perform with any further intervention but we will allow time to see if the patient's wound will heal or if an area of demarcation forms.  I discussed with patient and husband that while the intervention was successful there may be tissue loss that is not reversible.  We will continue with close follow-up to see if amputation is necessary.  2. Primary hypertension Continue antihypertensive medications as already ordered, these medications have been reviewed and there are no changes at this time.   3. PAD (peripheral artery disease) ([place]) The patient's left lower extremity ABIs have reduced since her previous studies.  Currently she does not have any pain or gangrenous changes.  Discussed possible intervention with the husband and they wish to treat conservatively at this time.  We will have the patient  return in 6 to 8 weeks with noninvasive studies.  Current Outpatient Medications on File Prior to Visit  Medication Sig Dispense Refill  . acetaminophen (TYLENOL) 500 MG tablet Take 1,000 mg by mouth every 6 (six) hours as needed for mild pain.    [name] amLODipine (NORVASC) 10 MG tablet Take 10 mg by mouth daily.    [name] aspirin EC 81 MG EC tablet Take 1 tablet (81 mg total) by mouth daily.    [name] atorvastatin (LIPITOR) 20 MG tablet Take 1 tablet (20 mg total) by mouth daily. (Patient taking differently: Take 20 mg by mouth at bedtime.) 30 tablet 11  . budesonide (PULMICORT) 0.25 MG/2ML nebulizer solution Take 2 mLs (0.25 mg total) by nebulization 2 (two) times daily. 60 mL 12  . carvedilol (COREG) 6.25 MG tablet Take 6.25 mg by mouth 2 (two) times daily with a meal.    . cetirizine (ZYRTEC) 10 MG tablet Take 10 mg by mouth daily.    . Cholecalciferol (VITAMIN D) 50 MCG (2000 UT) CAPS Take 2,000 Units by mouth daily.     . clopidogrel (PLAVIX) 75 MG tablet Take 1 tablet (75 mg total) by mouth daily with breakfast. 30 tablet 0  . docusate sodium (COLACE) 100 MG capsule Take 100 mg by mouth 2 (two) times daily.    . fluticasone (FLONASE) 50 MCG/ACT nasal spray Place 1 spray into both nostrils daily as needed for allergies.     . hydrALAZINE (APRESOLINE) 25 MG tablet Take 1 tablet (25 mg total) by mouth every 8 (eight) hours. (Patient taking differently: Take 25 mg by mouth 3 (three) times daily.) 60 tablet 0  . insulin glargine (LANTUS) 100 UNIT/ML injection Inject 0.15 mLs (15 Units total) into the skin at bedtime. (Patient taking differently: Inject 18 Units into the skin at bedtime.) 1 vial 0  . magnesium oxide (MAG-OX) 400 MG tablet Take 400 mg by mouth 2 (two) times daily.     . modafinil (PROVIGIL) 200 MG tablet Take 200 mg by mouth daily.    [name] omeprazole (PRILOSEC) 20 MG capsule Take 1 capsule (20 mg total) by mouth daily.    . polyethylene glycol (MIRALAX / GLYCOLAX) packet Take 17 g by mouth 2  (two) times daily. (Patient taking differently: Take 17 g by mouth daily as needed for moderate constipation.) 14 each 0  . potassium chloride SA (K-DUR,KLOR-CON) 20 MEQ tablet Take 20 mEq by mouth daily.    [name] torsemide (DEMADEX) 20 MG tablet Take 1 tablet (20 mg total) by mouth 2 (two) times daily.    . Vitamin E 180 MG CAPS Take 180 mg by mouth daily.     No current facility-administered medications on file prior to visit.    There are no Patient Instructions on file for this visit. No follow-ups on file.   Kris Hartmann, NP

## 2020-11-25 NOTE — Progress Notes (Incomplete)
Subjective:    Patient ID: [name], female    DOB: 08-11-1940, 80 y.o.   MRN: [id number] Chief Complaint  Patient presents with  . Follow-up    1 mo U/S follow up    HPI  Review of Systems     Objective:   Physical Exam Vitals reviewed.  HENT:     Head: Normocephalic.  Cardiovascular:     Rate and Rhythm: Normal rate.  Pulmonary:     Effort: Pulmonary effort is normal.  Skin:    General: Skin is warm and dry.  Neurological:     Mental Status: She is alert and oriented to person, place, and time. Mental status is at baseline.     Motor: Weakness present.     Gait: Gait abnormal.  Psychiatric:        Mood and Affect: Mood normal.        Behavior: Behavior normal.        Thought Content: Thought content normal.        Judgment: Judgment normal.     BP (!) 143/79   Pulse 73   Ht 5\' 8"  (1.727 m)   BMI 22.19 kg/m   Past Medical History:  Diagnosis Date  . Acute heart failure ([place])   . Aortic stenosis   . CHF (congestive heart failure) ([place])   . Complication of anesthesia    Hard to wake patient up after having anesthesia  . Diabetes mellitus without complication ([place])   . Diabetic neuropathy (HCC)    Takes Lyrica  . Edema of both legs    Takes Lasix  . GERD (gastroesophageal reflux disease)   . High cholesterol   . HTN (hypertension)   . Hypertension   . PAD (peripheral artery disease) ([place])   . Shortness of breath dyspnea   . Spasm of back muscles   . Stroke ([place])   . Wound, open    Right great toe    Social History   Socioeconomic History  . Marital status: Married    Spouse name: Not on file  . Number of children: Not on file  . Years of education: Not on file  . Highest education level: Not on file  Occupational History  . Not on file  Tobacco Use  . Smoking status: Never Smoker  . Smokeless tobacco: Never Used  . Tobacco comment: Never smoked  Substance and Sexual Activity  . Alcohol use: No    Alcohol/week: 0.0 standard drinks   . Drug use: No  . Sexual activity: Never  Other Topics Concern  . Not on file  Social History Narrative  . Not on file   Social Determinants of Health   Financial Resource Strain: Not on file  Food Insecurity: Not on file  Transportation Needs: Not on file  Physical Activity: Not on file  Stress: Not on file  Social Connections: Not on file  Intimate Partner Violence: Not on file    Past Surgical History:  Procedure Laterality Date  . AMPUTATION TOE Left 06/09/2019   Procedure: Left third toe and partial great toe amputation and debridement;  Surgeon: Katha Cabal, MD;  Location: [facility];  Service: Vascular;  Laterality: Left;  . AMPUTATION TOE Left 04/06/2020   Procedure: AMPUTATION LEFT SECOND TOE;  Surgeon: Samara Deist, DPM;  Location: [facility];  Service: Podiatry;  Laterality: Left;  . BYPASS GRAFT POPLITEAL TO TIBIAL Right 02/28/2016   Procedure: BYPASS GRAFT RIGHT BELOW KNEE POPLITEAL TO PERONEAL  USING REVERSED RIGHT GREATER SAPHENOUS VEIN;  Surgeon: Conrad Clay, MD;  Location: [place];  Service: Vascular;  Laterality: Right;  . CYST EXCISION     Abdomen  . EYE SURGERY Bilateral    Cataract removal  . INTRAMEDULLARY (IM) NAIL INTERTROCHANTERIC Left 02/04/2014   Procedure: INTRAMEDULLARY (IM) NAIL INTERTROCHANTRIC FEMORAL;  Surgeon: Mauri Pole, MD;  Location: [place];  Service: Orthopedics;  Laterality: Left;  . IR GENERIC HISTORICAL  03/01/2016   IR ANGIO INTRA EXTRACRAN SEL COM CAROTID INNOMINATE UNI R MOD SED 03/01/2016 [name], MD [facility]  . IR GENERIC HISTORICAL  03/01/2016   IR ENDOVASC INTRACRANIAL INF OTHER THAN THROMBO ART INC DIAG ANGIO 03/01/2016 [name], MD [facility]  . IR GENERIC HISTORICAL  03/01/2016   IR INTRAVSC STENT CERV CAROTID W/O EMB-PROT MOD SED INC ANGIO 03/01/2016 [name], MD [facility]  . IR GENERIC HISTORICAL  06/03/2016   IR RADIOLOGIST EVAL & MGMT 06/03/2016 [facility]  . LOWER EXTREMITY  ANGIOGRAPHY Left 02/09/2019   Procedure: LOWER EXTREMITY ANGIOGRAPHY;  Surgeon: Katha Cabal, MD;  Location: [place] [facility];  Service: Cardiovascular;  Laterality: Left;  . LOWER EXTREMITY ANGIOGRAPHY Left 03/10/2019   Procedure: LOWER EXTREMITY ANGIOGRAPHY;  Surgeon: Katha Cabal, MD;  Location: [place] [facility];  Service: Cardiovascular;  Laterality: Left;  . LOWER EXTREMITY ANGIOGRAPHY Left 04/27/2019   Procedure: LOWER EXTREMITY ANGIOGRAPHY;  Surgeon: Katha Cabal, MD;  Location: [place] [facility];  Service: Cardiovascular;  Laterality: Left;  . LOWER EXTREMITY ANGIOGRAPHY Left 06/08/2019   Procedure: Lower Extremity Angiography;  Surgeon: Katha Cabal, MD;  Location: [place] [facility];  Service: Cardiovascular;  Laterality: Left;  . LOWER EXTREMITY ANGIOGRAPHY Left 04/03/2020   Procedure: Lower Extremity Angiography;  Surgeon: Algernon Huxley, MD;  Location: [place] [facility];  Service: Cardiovascular;  Laterality: Left;  . LOWER EXTREMITY ANGIOGRAPHY Right 10/20/2020   Procedure: Lower Extremity Angiography;  Surgeon: Katha Cabal, MD;  Location: [place] [facility];  Service: Cardiovascular;  Laterality: Right;  . ORIF TOE FRACTURE Right 02/08/2014   Procedure: OPEN REDUCTION INTERNAL FIXATION Right METATARSAL  FRACTURE ;  Surgeon: Wylene Simmer, MD;  Location: [place];  Service: Orthopedics;  Laterality: Right;  . PERIPHERAL VASCULAR CATHETERIZATION N/A 12/28/2015   Procedure: Abdominal Aortogram w/Lower Extremity;  Surgeon: Conrad Panola, MD;  Location: [place] [facility];  Service: Cardiovascular;  Laterality: N/A;  . RADIOLOGY WITH ANESTHESIA N/A 03/01/2016   Procedure: RADIOLOGY WITH ANESTHESIA;  Surgeon: [name], MD;  Location: [place];  Service: Radiology;  Laterality: N/A;  . VEIN HARVEST Right 02/28/2016   Procedure: RIGHT GREATER SAPHENOUS VEIN HARVEST;  Surgeon: Conrad [place], MD;  Location: [place];  Service: Vascular;  Laterality: Right;     Family History  Problem Relation Age of Onset  . Diabetes Other   . Liver disease Mother   . CVA Father   . Diabetes Father   . Hypertension Father     Allergies  Allergen Reactions  . Iodine Anaphylaxis  . Penicillins Anaphylaxis    Tolerates ceftriaxone, cefazolin Did it involve swelling of the face/tongue/throat, SOB, or low BP? Unknown Did it involve sudden or severe rash/hives, skin peeling, or any reaction on the inside of your mouth or nose? Unknown Did you need to seek medical attention at a hospital or doctor's office? Unknown When did it last happen?Unknown If all above answers are "NO", may proceed with  cephalosporin use.   . Shellfish Allergy Anaphylaxis  . Sulfa Antibiotics Anaphylaxis  . Sulfacetamide Sodium Anaphylaxis  . Sulfasalazine Anaphylaxis  . Eggs Or Egg-Derived Products     Other reaction(s): SHORTNESS OF BREATH  . Morphine And Related Other (See Comments)    Altered mental status    CBC Latest Ref Rng & Units 10/23/2020 10/22/2020 10/21/2020  WBC 4.0 - 10.5 K/uL 10.3 10.6(H) 13.3(H)  Hemoglobin 12.0 - 15.0 g/dL 10.5(L) 9.7(L) 11.6(L)  Hematocrit 36.0 - 46.0 % 32.8(L) 30.6(L) 36.7  Platelets 150 - 400 K/uL 212 209 232      CMP     Component Value Date/Time   NA 138 10/23/2020 0400   K 3.8 10/23/2020 0400   CL 109 10/23/2020 0400   CO2 20 (L) 10/23/2020 0400   GLUCOSE 163 (H) 10/23/2020 0400   BUN 10 10/23/2020 0400   CREATININE 0.44 10/23/2020 0400   CALCIUM 8.9 10/23/2020 0400   PROT 6.0 (L) 10/19/2020 1204   ALBUMIN 3.3 (L) 10/19/2020 1204   AST 30 10/19/2020 1204   ALT 36 10/19/2020 1204   ALKPHOS 57 10/19/2020 1204   BILITOT 0.8 10/19/2020 1204   GFRNONAA >60 10/23/2020 0400   GFRAA >60 04/06/2020 0535     No results found.     Assessment & Plan:   1. Necrotic toes (HCC) The gangrenous changes have greatly improved since her hospitalization however the right great toe still has some necrotic appearance.  She  currently does not have any drainage or foul-smelling odor from the wound.  At this time we will not perform with any further intervention but we will allow time to see if the patient's wound will heal or if an area of demarcation forms.  I discussed with patient and husband that while the intervention was successful there may be tissue loss that is not reversible.  We will continue with close follow-up to see if amputation is necessary.  2. Primary hypertension Continue antihypertensive medications as already ordered, these medications have been reviewed and there are no changes at this time.   3. PAD (peripheral artery disease) ([place]) The patient's left lower extremity ABIs have reduced since her previous studies.  Currently she does not have any pain or gangrenous changes.  Discussed possible intervention with the husband and they wish to treat conservatively at this time.  We will have the patient return in 6 to 8 weeks with noninvasive studies.   Current Outpatient Medications on File Prior to Visit  Medication Sig Dispense Refill  . acetaminophen (TYLENOL) 500 MG tablet Take 1,000 mg by mouth every 6 (six) hours as needed for mild pain.    [name] amLODipine (NORVASC) 10 MG tablet Take 10 mg by mouth daily.    [name] aspirin EC 81 MG EC tablet Take 1 tablet (81 mg total) by mouth daily.    [name] atorvastatin (LIPITOR) 20 MG tablet Take 1 tablet (20 mg total) by mouth daily. (Patient taking differently: Take 20 mg by mouth at bedtime.) 30 tablet 11  . budesonide (PULMICORT) 0.25 MG/2ML nebulizer solution Take 2 mLs (0.25 mg total) by nebulization 2 (two) times daily. 60 mL 12  . carvedilol (COREG) 6.25 MG tablet Take 6.25 mg by mouth 2 (two) times daily with a meal.    . cetirizine (ZYRTEC) 10 MG tablet Take 10 mg by mouth daily.    . Cholecalciferol (VITAMIN D) 50 MCG (2000 UT) CAPS Take 2,000 Units by mouth daily.     . clopidogrel (  PLAVIX) 75 MG tablet Take 1 tablet (75 mg total) by mouth daily with  breakfast. 30 tablet 0  . docusate sodium (COLACE) 100 MG capsule Take 100 mg by mouth 2 (two) times daily.    . fluticasone (FLONASE) 50 MCG/ACT nasal spray Place 1 spray into both nostrils daily as needed for allergies.     . hydrALAZINE (APRESOLINE) 25 MG tablet Take 1 tablet (25 mg total) by mouth every 8 (eight) hours. (Patient taking differently: Take 25 mg by mouth 3 (three) times daily.) 60 tablet 0  . insulin glargine (LANTUS) 100 UNIT/ML injection Inject 0.15 mLs (15 Units total) into the skin at bedtime. (Patient taking differently: Inject 18 Units into the skin at bedtime.) 1 vial 0  . magnesium oxide (MAG-OX) 400 MG tablet Take 400 mg by mouth 2 (two) times daily.     . modafinil (PROVIGIL) 200 MG tablet Take 200 mg by mouth daily.    [name] omeprazole (PRILOSEC) 20 MG capsule Take 1 capsule (20 mg total) by mouth daily.    . polyethylene glycol (MIRALAX / GLYCOLAX) packet Take 17 g by mouth 2 (two) times daily. (Patient taking differently: Take 17 g by mouth daily as needed for moderate constipation.) 14 each 0  . potassium chloride SA (K-DUR,KLOR-CON) 20 MEQ tablet Take 20 mEq by mouth daily.    [name] torsemide (DEMADEX) 20 MG tablet Take 1 tablet (20 mg total) by mouth 2 (two) times daily.    . Vitamin E 180 MG CAPS Take 180 mg by mouth daily.     No current facility-administered medications on file prior to visit.    There are no Patient Instructions on file for this visit. No follow-ups on file.   Kris Hartmann, NP

## 2020-12-19 ENCOUNTER — Ambulatory Visit (HOSPITAL_COMMUNITY): Payer: Medicare HMO | Admitting: Physical Therapy

## 2021-01-08 ENCOUNTER — Encounter (INDEPENDENT_AMBULATORY_CARE_PROVIDER_SITE_OTHER): Payer: Medicare HMO

## 2021-01-08 ENCOUNTER — Ambulatory Visit (INDEPENDENT_AMBULATORY_CARE_PROVIDER_SITE_OTHER): Payer: Medicare HMO | Admitting: Vascular Surgery

## 2021-01-10 ENCOUNTER — Ambulatory Visit: Payer: Medicare HMO | Attending: Family Medicine

## 2021-01-10 ENCOUNTER — Other Ambulatory Visit: Payer: Self-pay

## 2021-01-10 DIAGNOSIS — R293 Abnormal posture: Secondary | ICD-10-CM | POA: Insufficient documentation

## 2021-01-10 DIAGNOSIS — R2689 Other abnormalities of gait and mobility: Secondary | ICD-10-CM | POA: Diagnosis present

## 2021-01-10 DIAGNOSIS — M6281 Muscle weakness (generalized): Secondary | ICD-10-CM | POA: Diagnosis not present

## 2021-01-10 NOTE — Patient Instructions (Signed)
  Access Code: [email or web address] URL: [email or web address] Date: 01/10/2021 Prepared by: Janna Arch  Exercises Seated March - 1 x daily - 7 x weekly - 2 sets - 10 reps - 5 hold Seated Long Arc Quad - 1 x daily - 7 x weekly - 2 sets - 10 reps - 5 hold Supine Heel Slides - 1 x daily - 7 x weekly - 2 sets - 10 reps - 5 hold

## 2021-01-10 NOTE — Therapy (Signed)
[place] MAIN [facility] SERVICES [address] [place], [place], [address] Phone: [phone]   Fax:  [phone]  Physical Therapy Evaluation  Patient Details  Name: [name] MRN: [id number] Date of Birth: 07/26/1941 Referring Provider (PT): [name]   Encounter Date: 01/10/2021   PT End of Session - 01/10/21 1110    Visit Number 1    Number of Visits 24    Date for PT Re-Evaluation 04/04/21    Authorization Type 1/10 eval 6/8    PT Start Time 1002    PT Stop Time 1058    PT Time Calculation (min) 56 min    Equipment Utilized During Treatment Gait belt    Activity Tolerance Patient limited by fatigue;Patient limited by pain    Behavior During Therapy Agitated           Past Medical History:  Diagnosis Date  . Acute heart failure ([place])   . Aortic stenosis   . CHF (congestive heart failure) ([place])   . Complication of anesthesia    Hard to wake patient up after having anesthesia  . Diabetes mellitus without complication ([place])   . Diabetic neuropathy (HCC)    Takes Lyrica  . Edema of both legs    Takes Lasix  . GERD (gastroesophageal reflux disease)   . High cholesterol   . HTN (hypertension)   . Hypertension   . PAD (peripheral artery disease) ([place])   . Shortness of breath dyspnea   . Spasm of back muscles   . Stroke ([place])   . Wound, open    Right great toe    Past Surgical History:  Procedure Laterality Date  . AMPUTATION TOE Left 06/09/2019   Procedure: Left third toe and partial great toe amputation and debridement;  Surgeon: Katha Cabal, MD;  Location: [facility];  Service: Vascular;  Laterality: Left;  . AMPUTATION TOE Left 04/06/2020   Procedure: AMPUTATION LEFT SECOND TOE;  Surgeon: Samara Deist, DPM;  Location: [facility];  Service: Podiatry;  Laterality: Left;  . BYPASS GRAFT POPLITEAL TO TIBIAL Right 02/28/2016   Procedure: BYPASS GRAFT RIGHT BELOW KNEE POPLITEAL TO PERONEAL USING REVERSED RIGHT GREATER  SAPHENOUS VEIN;  Surgeon: Conrad Severn, MD;  Location: [place];  Service: Vascular;  Laterality: Right;  . CYST EXCISION     Abdomen  . EYE SURGERY Bilateral    Cataract removal  . INTRAMEDULLARY (IM) NAIL INTERTROCHANTERIC Left 02/04/2014   Procedure: INTRAMEDULLARY (IM) NAIL INTERTROCHANTRIC FEMORAL;  Surgeon: Mauri Pole, MD;  Location: [place];  Service: Orthopedics;  Laterality: Left;  . IR GENERIC HISTORICAL  03/01/2016   IR ANGIO INTRA EXTRACRAN SEL COM CAROTID INNOMINATE UNI R MOD SED 03/01/2016 [name], MD [facility]  . IR GENERIC HISTORICAL  03/01/2016   IR ENDOVASC INTRACRANIAL INF OTHER THAN THROMBO ART INC DIAG ANGIO 03/01/2016 [name], MD [facility]  . IR GENERIC HISTORICAL  03/01/2016   IR INTRAVSC STENT CERV CAROTID W/O EMB-PROT MOD SED INC ANGIO 03/01/2016 [name], MD [facility]  . IR GENERIC HISTORICAL  06/03/2016   IR RADIOLOGIST EVAL & MGMT 06/03/2016 [facility]  . LOWER EXTREMITY ANGIOGRAPHY Left 02/09/2019   Procedure: LOWER EXTREMITY ANGIOGRAPHY;  Surgeon: Katha Cabal, MD;  Location: [place] CV LAB;  Service: Cardiovascular;  Laterality: Left;  . LOWER EXTREMITY ANGIOGRAPHY Left 03/10/2019   Procedure: LOWER EXTREMITY ANGIOGRAPHY;  Surgeon: Katha Cabal, MD;  Location: [place] CV LAB;  Service: Cardiovascular;  Laterality: Left;  . LOWER EXTREMITY ANGIOGRAPHY Left 04/27/2019   Procedure: LOWER EXTREMITY ANGIOGRAPHY;  Surgeon: Katha Cabal, MD;  Location: [place] CV LAB;  Service: Cardiovascular;  Laterality: Left;  . LOWER EXTREMITY ANGIOGRAPHY Left 06/08/2019   Procedure: Lower Extremity Angiography;  Surgeon: Katha Cabal, MD;  Location: [place] CV LAB;  Service: Cardiovascular;  Laterality: Left;  . LOWER EXTREMITY ANGIOGRAPHY Left 04/03/2020   Procedure: Lower Extremity Angiography;  Surgeon: Algernon Huxley, MD;  Location: [place] CV LAB;  Service: Cardiovascular;  Laterality: Left;  .  LOWER EXTREMITY ANGIOGRAPHY Right 10/20/2020   Procedure: Lower Extremity Angiography;  Surgeon: Katha Cabal, MD;  Location: [place] CV LAB;  Service: Cardiovascular;  Laterality: Right;  . ORIF TOE FRACTURE Right 02/08/2014   Procedure: OPEN REDUCTION INTERNAL FIXATION Right METATARSAL  FRACTURE ;  Surgeon: Wylene Simmer, MD;  Location: [place];  Service: Orthopedics;  Laterality: Right;  . PERIPHERAL VASCULAR CATHETERIZATION N/A 12/28/2015   Procedure: Abdominal Aortogram w/Lower Extremity;  Surgeon: Conrad Bloomingdale, MD;  Location: [place] CV LAB;  Service: Cardiovascular;  Laterality: N/A;  . RADIOLOGY WITH ANESTHESIA N/A 03/01/2016   Procedure: RADIOLOGY WITH ANESTHESIA;  Surgeon: [name], MD;  Location: [place];  Service: Radiology;  Laterality: N/A;  . VEIN HARVEST Right 02/28/2016   Procedure: RIGHT GREATER SAPHENOUS VEIN HARVEST;  Surgeon: Conrad [place] Star, MD;  Location: [place];  Service: Vascular;  Laterality: Right;    There were no vitals filed for this visit.    Subjective Assessment - 01/10/21 1105    Subjective Patient presents to physical therapy for deconditioning with spouse.    Patient is accompained by: Family member    Pertinent History [place] outpatient for core strengthening and working toward ambulation.   Patient has deconditioning, amputation of 2nd toe, pressure ulcer of sacral region stage II, amputation of third toe, hypercapnic respiratory failure, peripheral neuropathy. PMH includes acute HF, CHF, DM, neuropathy, GERD, HTN, PAD, stroke, COPD, ESBL, CVA. Patient has a caregiver 4 hours a day for three times a week. Patient is dependent for all mobility, transfers, toileting and ADL needs. Patient has not ambulated in three years and wishes to ambulate. At home patient utilizes hoyer lift to wheelchair but is not able to propel herself.   No toileting equipment at pt's disposal besides disposable briefs that pt can use due to deconditioned state she is in now.  Husband cleans wife daily after soiling briefs for BM and urination. 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    Limitations Lifting;Standing;Walking;House hold activities;Sitting    How long can you sit comfortably? requires back rest; limited by pain in buttocks    How long can you stand comfortably? Unable    How long can you walk comfortably? Unable    Patient Stated Goals To transfer    Currently in Pain? Yes    Pain Score --   patient unable to give number   Pain Location --   legs, feet, and buttocks.   Pain Orientation Right;Left    Pain Descriptors / Indicators --   patient unable to describe pain   Pain Type Chronic pain    Pain Onset More than a month ago    Pain Frequency Constant    Aggravating Factors  patient unable to state    Pain Relieving Factors patient unable to state    Effect of Pain on Daily Activities  patient unable to state                 PAIN: Patient reports pain in legs, feet, and bottom. Patient unable to give number but frequently complains of pain throughout evaluation.   POSTURE: Excessive trunk recline against chair.   PROM/AROM:  AROM BUE Flexion: only able to perform in slight abduction with bilateral ~89 degrees  AROM BLE:   unable to be assessed in supine due to patient requiring a hoyer lift.   Right Left  Hip flexion Limited by chair Limited by chair  Hip Abduction Limited by chair Limited by chair  Hip Adduction Limited by chair Limited by chair  Knee Extension  [facility]  Knee Flexion 70 60  DF -12 -16  PF WFL WFL     STRENGTH:  Graded on a 0-5 scale Muscle Group Left Right  Shoulder flex 2+/5 2+/5  Shoulder Abd 2+/5 2+/5  Shoulder Ext 2+/5 2+/5  Elbow 3/5 3/5  Wrist/hand 3+/5 3/5  Hip Flex 2+/5 2+/5  Hip Abd 2/5 2/5  Hip Add 2/5 2/5  Hip Ext 2/5 2/5  Hip IR/ER /5 /5  Knee Flex 2+/5 2+/5  Knee Ext 3/5 3/5  Ankle DF 2/5 2/5  Ankle PF 2/5 2/5   SENSATION:  BUE  :  BLE :   NEUROLOGICAL SCREEN: (2+ unless otherwise noted.) N=normal  Ab=abnormal   Level Dermatome R L  T1 Medial Arm N N  L2 Medial thigh/groin N N  L3 Lower thigh/med.knee ab ab  L4 Medial leg/lat thigh ab ab  L5 Lat. leg & dorsal foot ab ab  S1 post/lat foot/thigh/leg ab ab  S2 Post./med. thigh & leg ab ab    SOMATOSENSORY:  Any N & T in extremities or weakness: reports :         Sensation           Intact      Diminished         Absent  Light touch  Bilateral LE                             COORDINATION: Finger to Nose: unable to perform          Heel Shin Slide Test: unable to perform   FUNCTIONAL MOBILITY:  Dependent with hoyer lift  BALANCE: Static Sitting Balance  Normal Able to maintain balance against maximal resistance   Good Able to maintain balance against moderate resistance   Good-/Fair+ Accepts minimal resistance   Fair Able to sit unsupported without balance loss and without UE support   Poor+ Able to maintain with Minimal assistance from individual or chair   Poor Unable to maintain balance-requires mod/max support from individual or chair x   Static Standing Balance  Normal Able to maintain standing balance against maximal resistance   Good Able to maintain standing balance against moderate resistance   Good-/Fair+ Able to maintain standing balance against minimal resistance   Fair Able to stand unsupported without UE support and without LOB for 1-2 min   Fair- Requires Min A and UE support to maintain standing without loss of balance   Poor+ Requires mod A and UE support to maintain standing without loss of balance   Poor Requires max A and UE support to maintain standing balance without loss X-unable to stand   Dynamic Sitting Balance  Normal Able to sit unsupported and weight shift across midline maximally  Good Able to sit unsupported and weight shift across midline moderately   Good-/Fair+ Able to sit unsupported and weight shift across  midline minimally   Fair Minimal weight shifting ipsilateral/front, difficulty crossing midline   Fair- Reach to ipsilateral side and unable to weight shift   Poor + Able to sit unsupported with min A and reach to ipsilateral side, unable to weight shift   Poor Able to sit unsupported with mod A and reach ipsilateral/front-can't cross midline X; cant sit unsupported   Standing Dynamic Balance  Normal Stand independently unsupported, able to weight shift and cross midline maximally   Good Stand independently unsupported, able to weight shift and cross midline moderately   Good-/Fair+ Stand independently unsupported, able to weight shift across midline minimally   Fair Stand independently unsupported, weight shift, and reach ipsilaterally, loss of balance when crossing midline   Poor+ Able to stand with Min A and reach ipsilaterally, unable to weight shift   Poor Able to stand with Mod A and minimally reach ipsilaterally, unable to cross midline. X cannot stand     GAIT: Cannot ambulate.   OUTCOME MEASURES: TEST Outcome Interpretation  FOTO 12% Predicted d/c score of 31%        Access Code: [email or web address] URL: [email or web address] Date: 01/10/2021 Prepared by: Janna Arch  Exercises Seated March - 1 x daily - 7 x weekly - 2 sets - 10 reps - 5 hold Seated Long Arc Quad - 1 x daily - 7 x weekly - 2 sets - 10 reps - 5 hold Supine Heel Slides - 1 x daily - 7 x weekly - 2 sets - 10 reps - 5 hold       Objective measurements completed on examination: See above findings.     Patient is a 80 year old female who presents with husband. She is oriented to self but not time or location. Patient is dependent at baseline for all mobility and ADLs. She does have some active ROM of LE's and UE's and husband educated that patient is not a candidate for ambulation at this time due to her status. Will focus on bed mobility and transfers for decreased dependence on caregiver,  husband agreeable. Patient unable to be transferred to table for full assessment today as hoyer was not available at time of evaluation. She does have contractions of bilateral knees and potentially of hips.  Patient will benefit from skilled physical therapy to improve tranfers, strength, and range of motion to reduce caregiver burden.           PT Education - 01/10/21 1110    Education Details goals, POC,    Person(s) Educated Patient    Methods Explanation;Demonstration;Tactile cues;Verbal cues;Handout    Comprehension Verbalized understanding;Returned demonstration;Verbal cues required;Tactile cues required            PT Short Term Goals - 01/10/21 1121      PT SHORT TERM GOAL #1   Title Patient will be independent in home exercise program to improve strength/mobility for better functional independence with ADLs.    Baseline 6/8: HEP given    Time 6    Period Weeks    Status New    Target Date 02/21/21             PT Long Term Goals - 01/10/21 1122      PT LONG TERM GOAL #1   Title Patient will increase FOTO score to equal to or greater than   31%  to demonstrate  statistically significant improvement in mobility and quality of life.    Baseline 6/8: 12%    Time 12    Period Weeks    Status New    Target Date 04/04/21      PT LONG TERM GOAL #2   Title Patient will roll L and R in bed with mod A for decreased caregiver burden for ADLs and toileting.    Baseline 6/8: dependent    Time 12    Period Weeks    Status New    Target Date 04/04/21      PT LONG TERM GOAL #3   Title Patient will sit EOB with min A for 10 minutes for ADL performance and pressure relief.    Baseline 6/8: unable    Time 12    Period Weeks    Status New    Target Date 04/04/21      PT LONG TERM GOAL #4   Title Patient will assist in transfer from bed to chair with mod assistance for decreased caregiver burden.    Baseline 6/8: dependent    Time 12    Period Weeks    Status New     Target Date 04/04/21                  Plan - 01/10/21 1112    Clinical Impression Statement Patient is a 80 year old female who presents with husband. She is oriented to self but not time or location. Patient is dependent at baseline for all mobility and ADLs. She does have some active ROM of LE's and UE's and husband educated that patient is not a candidate for ambulation at this time due to her status. Will focus on bed mobility and transfers for decreased dependence on caregiver, husband agreeable. Patient unable to be transferred to table for full assessment today as hoyer was not available at time of evaluation. She does have contractions of bilateral knees and potentially of hips.  Patient will benefit from skilled physical therapy to improve tranfers, strength, and range of motion to reduce caregiver burden.    Personal Factors and Comorbidities Age;Time since onset of injury/illness/exacerbation;Comorbidity 3+;Past/Current Experience;Education;Social Background;Transportation    Comorbidities diastolic congestive heart failure, diabetes mellitus, diabetic neuropathy, hypertension, hyperlipidemia, GERD, peripheral vascular disease, CVA. on 9/2 had amputation of L foot second toe MTP joint and LLE revascularization.    Examination-Activity Limitations Bathing;Hygiene/Grooming;Squat;Bed Mobility;Stairs;Lift;Bend;Locomotion Level;Stand;Caring for [employer];Toileting;Carry;Self Feeding;Transfers;Continence;Dressing;Other    Examination-Participation Restrictions Cleaning;Shop;Community Activity;Meal Prep;Driving;Other    Stability/Clinical Decision Making Unstable/Unpredictable    Clinical Decision Making High    Rehab Potential Poor    PT Frequency 2x / week    PT Duration 12 weeks    PT Treatment/Interventions ADLs/Self Care Home Management;DME Instruction;Functional mobility training;Therapeutic activities;Therapeutic exercise;Neuromuscular re-education;Patient/family  education;Wheelchair mobility training;Passive range of motion;Energy conservation;Cryotherapy;Biofeedback;Electrical Stimulation;Iontophoresis 4mg /ml Dexamethasone;Moist Heat;Traction;Ultrasound;Gait training;Stair training;Cognitive remediation;Balance training;Orthotic Fit/Training;Manual techniques;Manual lymph drainage;Splinting;Taping;Vasopneumatic Device;Vestibular;Visual/perceptual remediation/compensation    PT Next Visit Plan hoyer lift to chair    PT Home Exercise Plan see above    Consulted and Agree with Plan of Care Patient;Family member/caregiver    Family Member Consulted Spouse           Patient will benefit from skilled therapeutic intervention in order to improve the following deficits and impairments:  Decreased cognition,Decreased skin integrity,Impaired sensation,Decreased mobility,Postural dysfunction,Decreased activity tolerance,Decreased endurance,Decreased range of motion,Decreased strength,Impaired perceived functional ability,Decreased balance,Decreased safety awareness,Increased edema,Impaired flexibility,Obesity,Decreased coordination,Cardiopulmonary status limiting activity,Abnormal gait,Impaired UE functional use,Impaired tone,Improper body mechanics,Pain,Difficulty walking  Visit Diagnosis: Muscle weakness (generalized)  Other abnormalities of gait and mobility  Abnormal posture     Problem List Patient Active Problem List   Diagnosis Date Noted  . COPD (chronic obstructive pulmonary disease) ([place]) 10/20/2020  . GERD (gastroesophageal reflux disease) 10/20/2020  . Physical deconditioning 10/20/2020  . Cellulitis 10/19/2020  . Necrotic toes ([place])   . Insulin dependent diabetes mellitus type IA ([place])   . Diabetic neuropathy ([place])   . Gangrene of toe of both feet ([place]) 06/06/2019  . PAD (peripheral artery disease) ([place]) 04/27/2019  . ESBL (extended spectrum beta-lactamase) producing bacteria infection 12/25/2018  . PVD (peripheral vascular disease)  ([place]) 09/12/2016  . Ulcer of right lower extremity ([place]) 08/29/2016  . Acute on chronic diastolic heart failure ([place]) 07/08/2016  . HTN (hypertension) 07/05/2016  . 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 Chronic diastolic CHF (congestive heart failure) ([place]) 03/10/2016  . HLD (hyperlipidemia) 03/09/2016  . Acute respiratory acidosis 03/05/2016  . Anemia, iron deficiency 03/03/2016  . Cerebrovascular accident (CVA) due to thrombosis of precerebral artery ([place]) 03/01/2016  . 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 Atherosclerosis of native arteries of the extremities with gangrene ([place]) 12/08/2015  . Anemia, chronic disease 04/27/2014  . Unspecified hereditary and idiopathic peripheral neuropathy 03/14/2014  . DM2 (diabetes mellitus, type 2) ([place]) 02/14/2014  . Aortic stenosis 02/04/2014  . Closed left subtrochanteric femur fracture ([place]) 02/03/2014  . Hip fracture, left ([place]) 02/03/2014  . Fibula fracture 02/03/2014  . MTP instability 02/03/2014  . Hip fracture ([place]) 02/03/2014   Janna Arch, PT, DPT   01/10/2021, 12:13 PM  [place] MAIN [facility] SERVICES [address] [place], [place], [address] Phone: [phone]   Fax:  [phone]  Name: [name] MRN: [id number] Date of Birth: Oct 19, 1940

## 2021-01-15 ENCOUNTER — Ambulatory Visit: Payer: Medicare HMO

## 2021-01-17 ENCOUNTER — Ambulatory Visit: Payer: Medicare HMO

## 2021-01-17 ENCOUNTER — Other Ambulatory Visit: Payer: Self-pay

## 2021-01-17 DIAGNOSIS — M6281 Muscle weakness (generalized): Secondary | ICD-10-CM

## 2021-01-17 DIAGNOSIS — R293 Abnormal posture: Secondary | ICD-10-CM

## 2021-01-17 DIAGNOSIS — R2689 Other abnormalities of gait and mobility: Secondary | ICD-10-CM

## 2021-01-17 NOTE — Therapy (Signed)
[place] MAIN [facility] SERVICES [address] [place], [place], [address] Phone: [phone]   Fax:  [phone]  Physical Therapy Treatment  Patient Details  Name: [name] MRN: [id number] Date of Birth: 1941/07/14 Referring Provider (PT): [name]   Encounter Date: 01/17/2021   PT End of Session - 01/17/21 1041     Visit Number 2    Number of Visits 24    Date for PT Re-Evaluation 04/04/21    Authorization Type 2/10 eval 6/8    PT Start Time 0941    PT Stop Time 1016    PT Time Calculation (min) 35 min    Equipment Utilized During Treatment Gait belt    Activity Tolerance Patient limited by fatigue;Patient limited by pain    Behavior During Therapy Agitated             Past Medical History:  Diagnosis Date   Acute heart failure ([place])    Aortic stenosis    CHF (congestive heart failure) ([place])    Complication of anesthesia    Hard to wake patient up after having anesthesia   Diabetes mellitus without complication ([place])    Diabetic neuropathy ([place])    Takes Lyrica   Edema of both legs    Takes Lasix   GERD (gastroesophageal reflux disease)    High cholesterol    HTN (hypertension)    Hypertension    PAD (peripheral artery disease) ([place])    Shortness of breath dyspnea    Spasm of back muscles    Stroke ([place])    Wound, open    Right great toe    Past Surgical History:  Procedure Laterality Date   AMPUTATION TOE Left 06/09/2019   Procedure: Left third toe and partial great toe amputation and debridement;  Surgeon: Katha Cabal, MD;  Location: [facility];  Service: Vascular;  Laterality: Left;   AMPUTATION TOE Left 04/06/2020   Procedure: AMPUTATION LEFT SECOND TOE;  Surgeon: Samara Deist, DPM;  Location: [facility];  Service: Podiatry;  Laterality: Left;   BYPASS GRAFT POPLITEAL TO TIBIAL Right 02/28/2016   Procedure: BYPASS GRAFT RIGHT BELOW KNEE POPLITEAL TO PERONEAL USING REVERSED RIGHT GREATER SAPHENOUS  VEIN;  Surgeon: Conrad Mansfield, MD;  Location: [place];  Service: Vascular;  Laterality: Right;   CYST EXCISION     Abdomen   EYE SURGERY Bilateral    Cataract removal   INTRAMEDULLARY (IM) NAIL INTERTROCHANTERIC Left 02/04/2014   Procedure: INTRAMEDULLARY (IM) NAIL INTERTROCHANTRIC FEMORAL;  Surgeon: Mauri Pole, MD;  Location: [place];  Service: Orthopedics;  Laterality: Left;   IR GENERIC HISTORICAL  03/01/2016   IR ANGIO INTRA EXTRACRAN SEL COM CAROTID INNOMINATE UNI R MOD SED 03/01/2016 [name], MD [facility]   IR GENERIC HISTORICAL  03/01/2016   IR ENDOVASC INTRACRANIAL INF OTHER THAN THROMBO ART INC DIAG ANGIO 03/01/2016 [name], MD [facility]   IR GENERIC HISTORICAL  03/01/2016   IR INTRAVSC STENT CERV CAROTID W/O EMB-PROT MOD SED INC ANGIO 03/01/2016 [name], MD [facility]   IR GENERIC HISTORICAL  06/03/2016   IR RADIOLOGIST EVAL & MGMT 06/03/2016 [facility]   LOWER EXTREMITY ANGIOGRAPHY Left 02/09/2019   Procedure: LOWER EXTREMITY ANGIOGRAPHY;  Surgeon: Katha Cabal, MD;  Location: [place] CV [facility];  Service: Cardiovascular;  Laterality: Left;   LOWER EXTREMITY ANGIOGRAPHY Left 03/10/2019   Procedure: LOWER EXTREMITY ANGIOGRAPHY;  Surgeon: Katha Cabal, MD;  Location: [place]  CV LAB;  Service: Cardiovascular;  Laterality: Left;   LOWER EXTREMITY ANGIOGRAPHY Left 04/27/2019   Procedure: LOWER EXTREMITY ANGIOGRAPHY;  Surgeon: Katha Cabal, MD;  Location: [place] [facility];  Service: Cardiovascular;  Laterality: Left;   LOWER EXTREMITY ANGIOGRAPHY Left 06/08/2019   Procedure: Lower Extremity Angiography;  Surgeon: Katha Cabal, MD;  Location: [place] CV LAB;  Service: Cardiovascular;  Laterality: Left;   LOWER EXTREMITY ANGIOGRAPHY Left 04/03/2020   Procedure: Lower Extremity Angiography;  Surgeon: Algernon Huxley, MD;  Location: [name] CV LAB;  Service: Cardiovascular;  Laterality: Left;   LOWER EXTREMITY  ANGIOGRAPHY Right 10/20/2020   Procedure: Lower Extremity Angiography;  Surgeon: Katha Cabal, MD;  Location: [place] CV LAB;  Service: Cardiovascular;  Laterality: Right;   ORIF TOE FRACTURE Right 02/08/2014   Procedure: OPEN REDUCTION INTERNAL FIXATION Right METATARSAL  FRACTURE ;  Surgeon: Wylene Simmer, MD;  Location: [place];  Service: Orthopedics;  Laterality: Right;   PERIPHERAL VASCULAR CATHETERIZATION N/A 12/28/2015   Procedure: Abdominal Aortogram w/Lower Extremity;  Surgeon: Conrad Sudley, MD;  Location: [place] CV LAB;  Service: Cardiovascular;  Laterality: N/A;   RADIOLOGY WITH ANESTHESIA N/A 03/01/2016   Procedure: RADIOLOGY WITH ANESTHESIA;  Surgeon: [name], MD;  Location: [place];  Service: Radiology;  Laterality: N/A;   VEIN HARVEST Right 02/28/2016   Procedure: RIGHT GREATER SAPHENOUS VEIN HARVEST;  Surgeon: Conrad , MD;  Location: [place];  Service: Vascular;  Laterality: Right;    There were no vitals filed for this visit.   Subjective Assessment - 01/17/21 1033     Subjective Patient presents with husband. Is late to PT session.    Patient is accompained by: Family member    Pertinent History [place] outpatient for core strengthening and working toward ambulation.   Patient has deconditioning, amputation of 2nd toe, pressure ulcer of sacral region stage II, amputation of third toe, hypercapnic respiratory failure, peripheral neuropathy. PMH includes acute HF, CHF, DM, neuropathy, GERD, HTN, PAD, stroke, COPD, ESBL, CVA. Patient has a caregiver 4 hours a day for three times a week. Patient is dependent for all mobility, transfers, toileting and ADL needs. Patient has not ambulated in three years and wishes to ambulate. At home patient utilizes hoyer lift to wheelchair but is not able to propel herself.   No toileting equipment at pt's disposal besides disposable briefs that pt can use due to deconditioned state she is in now. Husband cleans wife daily after soiling  briefs for BM and urination. 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    Limitations Lifting;Standing;Walking;House hold activities;Sitting    How long can you sit comfortably? requires back rest; limited by pain in buttocks    How long can you stand comfortably? Unable    How long can you walk comfortably? Unable    Patient Stated Goals To transfer    Currently in Pain? Yes    Pain Score --   wont give number   Pain Location --   whole body   Pain Descriptors / Indicators Aching    Pain Onset More than a month ago                Treatment: Seated in chair due to limited time:  LAQ 10x each LE  Ball squeeze adduction 10x 3 second holds  Abduction isometric against PT hand single limb at a time 10x 3 second holds Hip flexion AAROM 10x  each LE Hamstring isometric into PT hand single limb at a time 10x 3 second holds  PF/DF AAROM; patient unable to perform actively  Right trunk lean 10x very challenging for patient PVC pipe trunk flexion/extension 10x; requires rest break after 5x  Rainbow ball flexion 10x Rainbow ball cross body twist 10x  Bilateral arm abduction 10x  Trunk positioning in front of mirror x2 minutes   Patient requires maximal cueing for task performance and encouragement for task performance.   Access Code: [email or web address] URL: [email or web address] Date: 01/17/2021 Prepared by: Janna Arch  Exercises Seated Heel Toe Raises - 1 x daily - 7 x weekly - 2 sets - 10 reps - 5 hold Seated Bilateral Bent Arm Shoulder Abduction - 1 x daily - 7 x weekly - 2 sets - 10 reps - 5 hold Seated Hip Adduction Isometrics with Ball - 1 x daily - 7 x weekly - 2 sets - 10 reps - 5 hold Seated Long Arc Quad - 1 x daily - 7 x weekly - 2 sets - 10 reps - 5 hold Seated March - 1 x daily - 7 x weekly - 2 sets - 10 reps - 5 hold Seated Lateral Trunk Leans with Neutral Spine - 1 x daily - 7 x weekly - 2 sets - 10 reps - 5  hold Seated Single Arm Shoulder Flexion AROM - 1 x daily - 7 x weekly - 2 sets - 10 reps - 5 hold    Patient arrived late to PT session. Husband educated on importance of arriving on time as patient requires hoyer lift transfer to table. Unable to perform hoyer transfer this session as session time is limited. Patient has extreme left trunk lean requiring focused intervention. Patient given new HEP for chair with husband verbalizing understanding. Patient will benefit from skilled physical therapy to improve tranfers, strength, and range of motion to reduce caregiver burden.                   PT Education - 01/17/21 1034     Education Details exercise technique, body mechanics    Person(s) Educated Patient    Methods Explanation;Demonstration;Tactile cues;Verbal cues;Handout    Comprehension Verbalized understanding;Returned demonstration;Verbal cues required;Tactile cues required              PT Short Term Goals - 01/10/21 1121       PT SHORT TERM GOAL #1   Title Patient will be independent in home exercise program to improve strength/mobility for better functional independence with ADLs.    Baseline 6/8: HEP given    Time 6    Period Weeks    Status New    Target Date 02/21/21               PT Long Term Goals - 01/10/21 1122       PT LONG TERM GOAL #1   Title Patient will increase FOTO score to equal to or greater than   31%  to demonstrate statistically significant improvement in mobility and quality of life.    Baseline 6/8: 12%    Time 12    Period Weeks    Status New    Target Date 04/04/21      PT LONG TERM GOAL #2   Title Patient will roll L and R in bed with mod A for decreased caregiver burden for ADLs and toileting.    Baseline 6/8: dependent    Time 12    Period Weeks  Status New    Target Date 04/04/21      PT LONG TERM GOAL #3   Title Patient will sit EOB with min A for 10 minutes for ADL performance and pressure relief.     Baseline 6/8: unable    Time 12    Period Weeks    Status New    Target Date 04/04/21      PT LONG TERM GOAL #4   Title Patient will assist in transfer from bed to chair with mod assistance for decreased caregiver burden.    Baseline 6/8: dependent    Time 12    Period Weeks    Status New    Target Date 04/04/21                   Plan - 01/17/21 1051     Clinical Impression Statement ?Patient arrived late to PT session. Husband educated on importance of arriving on time as patient requires hoyer lift transfer to table. Unable to perform hoyer transfer this session as session time is limited. Patient has extreme left trunk lean requiring focused intervention. Patient given new HEP for chair with husband verbalizing understanding. Patient will benefit from skilled physical therapy to improve tranfers, strength, and range of motion to reduce caregiver burden.    Personal Factors and Comorbidities Age;Time since onset of injury/illness/exacerbation;Comorbidity 3+;Past/Current Experience;Education;Social Background;Transportation    Comorbidities diastolic congestive heart failure, diabetes mellitus, diabetic neuropathy, hypertension, hyperlipidemia, GERD, peripheral vascular disease, CVA. on 9/2 had amputation of L foot second toe MTP joint and LLE revascularization.    Examination-Activity Limitations Bathing;Hygiene/Grooming;Squat;Bed Mobility;Stairs;Lift;Bend;Locomotion Level;Stand;Caring for [employer];Toileting;Carry;Self Feeding;Transfers;Continence;Dressing;Other    Examination-Participation Restrictions Cleaning;Shop;Community Activity;Meal Prep;Driving;Other    Stability/Clinical Decision Making Unstable/Unpredictable    Rehab Potential Poor    PT Frequency 2x / week    PT Duration 12 weeks    PT Treatment/Interventions ADLs/Self Care Home Management;DME Instruction;Functional mobility training;Therapeutic activities;Therapeutic exercise;Neuromuscular  re-education;Patient/family education;Wheelchair mobility training;Passive range of motion;Energy conservation;Cryotherapy;Biofeedback;Electrical Stimulation;Iontophoresis 4mg /ml Dexamethasone;Moist Heat;Traction;Ultrasound;Gait training;Stair training;Cognitive remediation;Balance training;Orthotic Fit/Training;Manual techniques;Manual lymph drainage;Splinting;Taping;Vasopneumatic Device;Vestibular;Visual/perceptual remediation/compensation    PT Next Visit Plan hoyer lift to chair    PT Home Exercise Plan see above    Consulted and Agree with Plan of Care Patient;Family member/caregiver    Family Member Consulted Spouse             Patient will benefit from skilled therapeutic intervention in order to improve the following deficits and impairments:  Decreased cognition, Decreased skin integrity, Impaired sensation, Decreased mobility, Postural dysfunction, Decreased activity tolerance, Decreased endurance, Decreased range of motion, Decreased strength, Impaired perceived functional ability, Decreased balance, Decreased safety awareness, Increased edema, Impaired flexibility, Obesity, Decreased coordination, Cardiopulmonary status limiting activity, Abnormal gait, Impaired UE functional use, Impaired tone, Improper body mechanics, Pain, Difficulty walking  Visit Diagnosis: Muscle weakness (generalized)  Other abnormalities of gait and mobility  Abnormal posture     Problem List Patient Active Problem List   Diagnosis Date Noted   COPD (chronic obstructive pulmonary disease) (HCC) 10/20/2020   GERD (gastroesophageal reflux disease) 10/20/2020   Physical deconditioning 10/20/2020   Cellulitis 10/19/2020   Necrotic toes (HCC)    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)    Diabetic neuropathy (HCC)    Gangrene of toe of both feet ([place]) 06/06/2019   PAD (peripheral artery disease) ([place]) 04/27/2019   ESBL (extended spectrum beta-lactamase) producing bacteria infection  12/25/2018   PVD (peripheral vascular disease) ([place]) 09/12/2016   Ulcer of right lower  extremity ([place]) 08/29/2016   Acute on chronic diastolic heart failure ([place]) 07/08/2016   HTN (hypertension) 07/05/2016   Sensorineural hearing loss (SNHL), 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   Chronic diastolic CHF (congestive heart failure) ([place]) 03/10/2016   HLD (hyperlipidemia) 03/09/2016   Acute respiratory acidosis 03/05/2016   Anemia, iron deficiency 03/03/2016   Cerebrovascular accident (CVA) due to thrombosis of precerebral artery ([place]) 03/01/2016   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 ([place]) 03/01/2016   Atherosclerosis of native arteries of the extremities with gangrene ([place]) 12/08/2015   Anemia, chronic disease 04/27/2014   Unspecified hereditary and idiopathic peripheral neuropathy 03/14/2014   DM2 (diabetes mellitus, type 2) ([place]) 02/14/2014   Aortic stenosis 02/04/2014   Closed left subtrochanteric femur fracture ([place]) 02/03/2014   Hip fracture, left ([place]) 02/03/2014   Fibula fracture 02/03/2014   MTP instability 02/03/2014   Hip fracture ([place]) 02/03/2014    Janna Arch, PT, DPT  01/17/2021, 10:53 AM  [facility] [address] [place], [place], [address] Phone: [phone]   Fax:  [phone]  Name: [name] MRN: [id number] Date of Birth: 08-31-1940

## 2021-01-18 ENCOUNTER — Other Ambulatory Visit (INDEPENDENT_AMBULATORY_CARE_PROVIDER_SITE_OTHER): Payer: Self-pay | Admitting: Vascular Surgery

## 2021-01-18 DIAGNOSIS — Z95828 Presence of other vascular implants and grafts: Secondary | ICD-10-CM

## 2021-01-18 DIAGNOSIS — I70263 Atherosclerosis of native arteries of extremities with gangrene, bilateral legs: Secondary | ICD-10-CM

## 2021-01-19 ENCOUNTER — Telehealth (INDEPENDENT_AMBULATORY_CARE_PROVIDER_SITE_OTHER): Payer: Self-pay

## 2021-01-19 ENCOUNTER — Ambulatory Visit (INDEPENDENT_AMBULATORY_CARE_PROVIDER_SITE_OTHER): Payer: Medicare HMO

## 2021-01-19 ENCOUNTER — Other Ambulatory Visit: Payer: Self-pay

## 2021-01-19 ENCOUNTER — Ambulatory Visit (INDEPENDENT_AMBULATORY_CARE_PROVIDER_SITE_OTHER): Payer: Medicare HMO | Admitting: Nurse Practitioner

## 2021-01-19 VITALS — BP 127/75 | HR 77 | Resp 16

## 2021-01-19 DIAGNOSIS — I739 Peripheral vascular disease, unspecified: Secondary | ICD-10-CM | POA: Diagnosis not present

## 2021-01-19 DIAGNOSIS — I70263 Atherosclerosis of native arteries of extremities with gangrene, bilateral legs: Secondary | ICD-10-CM

## 2021-01-19 DIAGNOSIS — Z95828 Presence of other vascular implants and grafts: Secondary | ICD-10-CM

## 2021-01-19 DIAGNOSIS — E782 Mixed hyperlipidemia: Secondary | ICD-10-CM | POA: Diagnosis not present

## 2021-01-19 DIAGNOSIS — I10 Essential (primary) hypertension: Secondary | ICD-10-CM | POA: Diagnosis not present

## 2021-01-19 NOTE — Progress Notes (Signed)
Subjective:    Patient ID: [name], female    DOB: 1940-09-01, 80 y.o.   MRN: [id number] Chief Complaint  Patient presents with   Follow-up    Ultrasound follow up    The patient returns to the office for followup and review of the noninvasive studies. There has been a significant deterioration in the lower extremity symptoms.  The patient notes interval shortening of their claudication distance and development of mild rest pain symptoms. No new ulcers or wounds have occurred since the last visit.  The patient's husband notes that her pain seems to have been worse for the last 2 weeks.  There have been no significant changes to the patient's overall health care.  The patient denies amaurosis fugax or recent TIA symptoms. There are no recent neurological changes noted. The patient denies history of DVT, PE or superficial thrombophlebitis. The patient denies recent episodes of angina or shortness of breath.   ABI's Rt=0.75 and Lt=0.00 (previous ABI's Rt=0.88 and Lt=0.50) the patient's bilateral TBI's are absent. Duplex US of the lower extremity arterial system shows biphasic waveforms in the right lower extremity until the mid SFA where it transitions to monophasic waveforms.  There are collaterals filling the distal anterior tibial artery however at the distal popliteal as well as posterior tibial and peroneal arteries are absent.  The patient's left lower extremity waveforms are biphasic until the distal SFA where waveforms are absent.  There seems to be no flow from the popliteal artery to the level of the ankle.   Review of Systems  Cardiovascular:  Positive for leg swelling.  Neurological:  Negative for weakness.  All other systems reviewed and are negative.     Objective:   Physical Exam Vitals reviewed.  Constitutional:      Appearance: She is obese.  HENT:     Head: Normocephalic.  Cardiovascular:     Rate and Rhythm: Normal rate.     Pulses:          Dorsalis pedis  pulses are 0 on the right side and 0 on the left side.       Posterior tibial pulses are 0 on the right side and 0 on the left side.  Pulmonary:     Effort: Pulmonary effort is normal.  Neurological:     Mental Status: She is alert. Mental status is at baseline.     Motor: Weakness present.  Psychiatric:        Attention and Perception: She is inattentive.        Mood and Affect: Mood and affect normal.        Speech: Speech is delayed.        Behavior: Behavior normal.        Cognition and Memory: Cognition is impaired. Memory is impaired.    BP 127/75 (BP Location: Right Arm)   Pulse 77   Resp 16   Past Medical History:  Diagnosis Date   Acute heart failure (HCC)    Aortic stenosis    CHF (congestive heart failure) (HCC)    Complication of anesthesia    Hard to wake patient up after having anesthesia   Diabetes mellitus without complication (HCC)    Diabetic neuropathy (HCC)    Takes Lyrica   Edema of both legs    Takes Lasix   GERD (gastroesophageal reflux disease)    High cholesterol    HTN (hypertension)    Hypertension    PAD (peripheral artery disease) ([place])  Shortness of breath dyspnea    Spasm of back muscles    Stroke (HCC)    Wound, open    Right great toe    Social History   Socioeconomic History   Marital status: Married    Spouse name: Not on file   Number of children: Not on file   Years of education: Not on file   Highest education level: Not on file  Occupational History   Not on file  Tobacco Use   Smoking status: Never   Smokeless tobacco: Never   Tobacco comments:    Never smoked  Substance and Sexual Activity   Alcohol use: No    Alcohol/week: 0.0 standard drinks   Drug use: No   Sexual activity: Never  Other Topics Concern   Not on file  Social History Narrative   Not on file   Social Determinants of Health   Financial Resource Strain: Not on file  Food Insecurity: Not on file  Transportation Needs: Not on file  Physical  Activity: Not on file  Stress: Not on file  Social Connections: Not on file  Intimate Partner Violence: Not on file    Past Surgical History:  Procedure Laterality Date   AMPUTATION TOE Left 06/09/2019   Procedure: Left third toe and partial great toe amputation and debridement;  Surgeon: Katha Cabal, MD;  Location: [facility];  Service: Vascular;  Laterality: Left;   AMPUTATION TOE Left 04/06/2020   Procedure: AMPUTATION LEFT SECOND TOE;  Surgeon: Samara Deist, DPM;  Location: [facility];  Service: Podiatry;  Laterality: Left;   BYPASS GRAFT POPLITEAL TO TIBIAL Right 02/28/2016   Procedure: BYPASS GRAFT RIGHT BELOW KNEE POPLITEAL TO PERONEAL USING REVERSED RIGHT GREATER SAPHENOUS VEIN;  Surgeon: Conrad Butte, MD;  Location: [place];  Service: Vascular;  Laterality: Right;   CYST EXCISION     Abdomen   EYE SURGERY Bilateral    Cataract removal   INTRAMEDULLARY (IM) NAIL INTERTROCHANTERIC Left 02/04/2014   Procedure: INTRAMEDULLARY (IM) NAIL INTERTROCHANTRIC FEMORAL;  Surgeon: Mauri Pole, MD;  Location: [place];  Service: Orthopedics;  Laterality: Left;   IR GENERIC HISTORICAL  03/01/2016   IR ANGIO INTRA EXTRACRAN SEL COM CAROTID INNOMINATE UNI R MOD SED 03/01/2016 [name], MD [facility]   IR GENERIC HISTORICAL  03/01/2016   IR ENDOVASC INTRACRANIAL INF OTHER THAN THROMBO ART INC DIAG ANGIO 03/01/2016 [name], MD [facility]   IR GENERIC HISTORICAL  03/01/2016   IR INTRAVSC STENT CERV CAROTID W/O EMB-PROT MOD SED INC ANGIO 03/01/2016 [name], MD [facility]   IR GENERIC HISTORICAL  06/03/2016   IR RADIOLOGIST EVAL & MGMT 06/03/2016 [facility]   LOWER EXTREMITY ANGIOGRAPHY Left 02/09/2019   Procedure: LOWER EXTREMITY ANGIOGRAPHY;  Surgeon: Katha Cabal, MD;  Location: [place] [facility];  Service: Cardiovascular;  Laterality: Left;   LOWER EXTREMITY ANGIOGRAPHY Left 03/10/2019   Procedure: LOWER EXTREMITY ANGIOGRAPHY;  Surgeon: Katha Cabal,  MD;  Location: [place] [facility];  Service: Cardiovascular;  Laterality: Left;   LOWER EXTREMITY ANGIOGRAPHY Left 04/27/2019   Procedure: LOWER EXTREMITY ANGIOGRAPHY;  Surgeon: Katha Cabal, MD;  Location: [place] [facility];  Service: Cardiovascular;  Laterality: Left;   LOWER EXTREMITY ANGIOGRAPHY Left 06/08/2019   Procedure: Lower Extremity Angiography;  Surgeon: Katha Cabal, MD;  Location: [place] [facility];  Service: Cardiovascular;  Laterality: Left;   LOWER EXTREMITY ANGIOGRAPHY Left 04/03/2020   Procedure: Lower Extremity Angiography;  Surgeon: Algernon Huxley, MD;  Location: [place] [facility];  Service: Cardiovascular;  Laterality: Left;   LOWER EXTREMITY ANGIOGRAPHY Right 10/20/2020   Procedure: Lower Extremity Angiography;  Surgeon: Katha Cabal, MD;  Location: [place] [facility];  Service: Cardiovascular;  Laterality: Right;   ORIF TOE FRACTURE Right 02/08/2014   Procedure: OPEN REDUCTION INTERNAL FIXATION Right METATARSAL  FRACTURE ;  Surgeon: Wylene Simmer, MD;  Location: [place];  Service: Orthopedics;  Laterality: Right;   PERIPHERAL VASCULAR CATHETERIZATION N/A 12/28/2015   Procedure: Abdominal Aortogram w/Lower Extremity;  Surgeon: Conrad [place]ville, MD;  Location: [place] [facility];  Service: Cardiovascular;  Laterality: N/A;   RADIOLOGY WITH ANESTHESIA N/A 03/01/2016   Procedure: RADIOLOGY WITH ANESTHESIA;  Surgeon: [name], MD;  Location: [place];  Service: Radiology;  Laterality: N/A;   VEIN HARVEST Right 02/28/2016   Procedure: RIGHT GREATER SAPHENOUS VEIN HARVEST;  Surgeon: Conrad Tuscarora, MD;  Location: [place];  Service: Vascular;  Laterality: Right;    Family History  Problem Relation Age of Onset   Diabetes Other    Liver disease Mother    CVA Father    Diabetes Father    Hypertension Father     Allergies  Allergen Reactions   Iodine Anaphylaxis   Penicillins Anaphylaxis    Tolerates ceftriaxone, cefazolin Did it involve swelling of the  face/tongue/throat, SOB, or low BP? Unknown Did it involve sudden or severe rash/hives, skin peeling, or any reaction on the inside of your mouth or nose? Unknown Did you need to seek medical attention at a hospital or doctor's office? Unknown When did it last happen?      Unknown If all above answers are "NO", may proceed with cephalosporin use.    Shellfish Allergy Anaphylaxis   Sulfa Antibiotics Anaphylaxis   Sulfacetamide Sodium Anaphylaxis   Sulfasalazine Anaphylaxis   Eggs Or Egg-Derived Products     Other reaction(s): SHORTNESS OF BREATH   Morphine And Related Other (See Comments)    Altered mental status    CBC Latest Ref Rng & Units 10/23/2020 10/22/2020 10/21/2020  WBC 4.0 - 10.5 K/uL 10.3 10.6(H) 13.3(H)  Hemoglobin 12.0 - 15.0 g/dL 10.5(L) 9.7(L) 11.6(L)  Hematocrit 36.0 - 46.0 % 32.8(L) 30.6(L) 36.7  Platelets 150 - 400 K/uL 212 209 232      CMP     Component Value Date/Time   NA 138 10/23/2020 0400   K 3.8 10/23/2020 0400   CL 109 10/23/2020 0400   CO2 20 (L) 10/23/2020 0400   GLUCOSE 163 (H) 10/23/2020 0400   BUN 10 10/23/2020 0400   CREATININE 0.44 10/23/2020 0400   CALCIUM 8.9 10/23/2020 0400   PROT 6.0 (L) 10/19/2020 1204   ALBUMIN 3.3 (L) 10/19/2020 1204   AST 30 10/19/2020 1204   ALT 36 10/19/2020 1204   ALKPHOS 57 10/19/2020 1204   BILITOT 0.8 10/19/2020 1204   GFRNONAA >60 10/23/2020 0400   GFRAA >60 04/06/2020 0535     VAS [place] ABI WITH/WO TBI  Result Date: 11/27/2020  LOWER EXTREMITY DOPPLER STUDY Patient Name:  TYLEA HISE  Date of Exam:   11/24/2020 Medical Rec #: [id number]      Accession #:    [id number] Date of Birth: 1941/06/21       Patient Gender: F Patient Age:   77Y Exam Location:  [facility] Procedure:      VAS [place] ABI WITH/WO TBI Referring Phys: [id number] [place] --------------------------------------------------------------------------------  Indications: Gangrene.  Comparison Study: 11/22/2019 Performing Technologist:  Charlane Ferretti RT (R)(VS)  Examination Guidelines: A complete evaluation includes at minimum, Doppler waveform signals and systolic blood pressure reading at the level of bilateral brachial, anterior tibial, and posterior tibial arteries, when vessel segments are accessible. Bilateral testing is considered an integral part of a complete examination. Photoelectric Plethysmograph (PPG) waveforms and toe systolic pressure readings are included as required and additional duplex testing as needed. Limited examinations for reoccurring indications may be performed as noted.  ABI Findings: +---------+------------------+-----+----------+----------+ Right    Rt Pressure (mmHg)IndexWaveform  Comment    +---------+------------------+-----+----------+----------+ Brachial 139                                         +---------+------------------+-----+----------+----------+ ATA      122               0.88 monophasic           +---------+------------------+-----+----------+----------+ PTA      111               0.80 monophasic           +---------+------------------+-----+----------+----------+ Great Toe                                 Gangrenous +---------+------------------+-----+----------+----------+ +---------+------------------+-----+----------+------------+ Left     Lt Pressure (mmHg)IndexWaveform  Comment      +---------+------------------+-----+----------+------------+ Brachial 136                                           +---------+------------------+-----+----------+------------+ ATA      70                0.50 monophasic             +---------+------------------+-----+----------+------------+ PTA                                       Not detected +---------+------------------+-----+----------+------------+ Great Toe                                 Amputated    +---------+------------------+-----+----------+------------+  +-------+-----------+-----------+------------+------------+ ABI/TBIToday's ABIToday's TBIPrevious ABIPrevious TBI +-------+-----------+-----------+------------+------------+ Right  .88                   1.06        .46          +-------+-----------+-----------+------------+------------+ Left   .50                   .87         AMP          +-------+-----------+-----------+------------+------------+ Bilateral ABIs appear decreased compared to prior study on 11/22/2019. Summary: Right: Resting right ankle-brachial index indicates mild right lower extremity arterial disease. Left: Resting left ankle-brachial index indicates moderate left lower extremity arterial disease. *See table(s) above for measurements and observations.  Electronically signed by Hortencia Pilar MD on 11/27/2020 at 9:56:28 AM.    Final        Assessment & Plan:   1. PAD (peripheral artery disease) ([place]) Recommend:  The  patient has evidence of severe atherosclerotic changes of both lower extremities with rest pain that is associated with preulcerative changes and impending tissue loss of the foot.  This represents a limb threatening ischemia and places the patient at the risk for limb loss.  Patient should undergo angiography of the lower extremities with the hope for intervention for limb salvage.  The risks and benefits as well as the alternative therapies was discussed in detail with the patient.  All questions were answered.  Patient agrees to proceed with angiography.  The patient will follow up with me in the office after the procedure.       2. Primary hypertension Continue antihypertensive medications as already ordered, these medications have been reviewed and there are no changes at this time.   3. Mixed hyperlipidemia Continue statin as ordered and reviewed, no changes at this time    Current Outpatient Medications on File Prior to Visit  Medication Sig Dispense Refill   acetaminophen (TYLENOL)  500 MG tablet Take 1,000 mg by mouth every 6 (six) hours as needed for mild pain.     amLODipine (NORVASC) 10 MG tablet Take 10 mg by mouth daily.     aspirin EC 81 MG EC tablet Take 1 tablet (81 mg total) by mouth daily.     atorvastatin (LIPITOR) 20 MG tablet Take 1 tablet (20 mg total) by mouth daily. (Patient taking differently: Take 20 mg by mouth at bedtime.) 30 tablet 11   budesonide (PULMICORT) 0.25 MG/2ML nebulizer solution Take 2 mLs (0.25 mg total) by nebulization 2 (two) times daily. 60 mL 12   carvedilol (COREG) 6.25 MG tablet Take 6.25 mg by mouth 2 (two) times daily with a meal.     cetirizine (ZYRTEC) 10 MG tablet Take 10 mg by mouth daily.     Cholecalciferol (VITAMIN D) 50 MCG (2000 UT) CAPS Take 2,000 Units by mouth daily.      clopidogrel (PLAVIX) 75 MG tablet Take 1 tablet (75 mg total) by mouth daily with breakfast. 30 tablet 0   docusate sodium (COLACE) 100 MG capsule Take 100 mg by mouth 2 (two) times daily.     fluticasone (FLONASE) 50 MCG/ACT nasal spray Place 1 spray into both nostrils daily as needed for allergies.      hydrALAZINE (APRESOLINE) 25 MG tablet Take 1 tablet (25 mg total) by mouth every 8 (eight) hours. (Patient taking differently: Take 25 mg by mouth 3 (three) times daily.) 60 tablet 0   insulin glargine (LANTUS) 100 UNIT/ML injection Inject 0.15 mLs (15 Units total) into the skin at bedtime. (Patient taking differently: Inject 18 Units into the skin at bedtime.) 1 vial 0   magnesium oxide (MAG-OX) 400 MG tablet Take 400 mg by mouth 2 (two) times daily.      modafinil (PROVIGIL) 200 MG tablet Take 200 mg by mouth daily.     omeprazole (PRILOSEC) 20 MG capsule Take 1 capsule (20 mg total) by mouth daily.     polyethylene glycol (MIRALAX / GLYCOLAX) packet Take 17 g by mouth 2 (two) times daily. (Patient taking differently: Take 17 g by mouth daily as needed for moderate constipation.) 14 each 0   potassium chloride SA (K-DUR,KLOR-CON) 20 MEQ tablet Take 20  mEq by mouth daily.     torsemide (DEMADEX) 20 MG tablet Take 1 tablet (20 mg total) by mouth 2 (two) times daily.     Vitamin E 180 MG CAPS Take 180 mg by mouth daily.  No current facility-administered medications on file prior to visit.    There are no Patient Instructions on file for this visit. No follow-ups on file.   Kris Hartmann, NP

## 2021-01-19 NOTE — Telephone Encounter (Signed)
Patient was seen today and scheduled with Dr. Delana Meyer for a LLE angio on 01/23/21 with a 1:00 pm arrival time to the [facility]. Pre-procedure instructions were discussed and handed to the patient and spouse.

## 2021-01-19 NOTE — H&P (View-Only) (Signed)
Subjective:    Patient ID: [name], female    DOB: September 25, 1940, 80 y.o.   MRN: [id number] Chief Complaint  Patient presents with   Follow-up    Ultrasound follow up    The patient returns to the office for followup and review of the noninvasive studies. There has been a significant deterioration in the lower extremity symptoms.  The patient notes interval shortening of their claudication distance and development of mild rest pain symptoms. No new ulcers or wounds have occurred since the last visit.  The patient's husband notes that her pain seems to have been worse for the last 2 weeks.  There have been no significant changes to the patient's overall health care.  The patient denies amaurosis fugax or recent TIA symptoms. There are no recent neurological changes noted. The patient denies history of DVT, PE or superficial thrombophlebitis. The patient denies recent episodes of angina or shortness of breath.   ABI's Rt=0.75 and Lt=0.00 (previous ABI's Rt=0.88 and Lt=0.50) the patient's bilateral TBI's are absent. Duplex US of the lower extremity arterial system shows biphasic waveforms in the right lower extremity until the mid SFA where it transitions to monophasic waveforms.  There are collaterals filling the distal anterior tibial artery however at the distal popliteal as well as posterior tibial and peroneal arteries are absent.  The patient's left lower extremity waveforms are biphasic until the distal SFA where waveforms are absent.  There seems to be no flow from the popliteal artery to the level of the ankle.   Review of Systems  Cardiovascular:  Positive for leg swelling.  Neurological:  Negative for weakness.  All other systems reviewed and are negative.     Objective:   Physical Exam Vitals reviewed.  Constitutional:      Appearance: She is obese.  HENT:     Head: Normocephalic.  Cardiovascular:     Rate and Rhythm: Normal rate.     Pulses:          Dorsalis pedis  pulses are 0 on the right side and 0 on the left side.       Posterior tibial pulses are 0 on the right side and 0 on the left side.  Pulmonary:     Effort: Pulmonary effort is normal.  Neurological:     Mental Status: She is alert. Mental status is at baseline.     Motor: Weakness present.  Psychiatric:        Attention and Perception: She is inattentive.        Mood and Affect: Mood and affect normal.        Speech: Speech is delayed.        Behavior: Behavior normal.        Cognition and Memory: Cognition is impaired. Memory is impaired.    BP 127/75 (BP Location: Right Arm)   Pulse 77   Resp 16   Past Medical History:  Diagnosis Date   Acute heart failure (HCC)    Aortic stenosis    CHF (congestive heart failure) (HCC)    Complication of anesthesia    Hard to wake patient up after having anesthesia   Diabetes mellitus without complication (HCC)    Diabetic neuropathy (HCC)    Takes Lyrica   Edema of both legs    Takes Lasix   GERD (gastroesophageal reflux disease)    High cholesterol    HTN (hypertension)    Hypertension    PAD (peripheral artery disease) ([place])  Shortness of breath dyspnea    Spasm of back muscles    Stroke (HCC)    Wound, open    Right great toe    Social History   Socioeconomic History   Marital status: Married    Spouse name: Not on file   Number of children: Not on file   Years of education: Not on file   Highest education level: Not on file  Occupational History   Not on file  Tobacco Use   Smoking status: Never   Smokeless tobacco: Never   Tobacco comments:    Never smoked  Substance and Sexual Activity   Alcohol use: No    Alcohol/week: 0.0 standard drinks   Drug use: No   Sexual activity: Never  Other Topics Concern   Not on file  Social History Narrative   Not on file   Social Determinants of Health   Financial Resource Strain: Not on file  Food Insecurity: Not on file  Transportation Needs: Not on file  Physical  Activity: Not on file  Stress: Not on file  Social Connections: Not on file  Intimate Partner Violence: Not on file    Past Surgical History:  Procedure Laterality Date   AMPUTATION TOE Left 06/09/2019   Procedure: Left third toe and partial great toe amputation and debridement;  Surgeon: Katha Cabal, MD;  Location: [facility];  Service: Vascular;  Laterality: Left;   AMPUTATION TOE Left 04/06/2020   Procedure: AMPUTATION LEFT SECOND TOE;  Surgeon: Samara Deist, DPM;  Location: [facility];  Service: Podiatry;  Laterality: Left;   BYPASS GRAFT POPLITEAL TO TIBIAL Right 02/28/2016   Procedure: BYPASS GRAFT RIGHT BELOW KNEE POPLITEAL TO PERONEAL USING REVERSED RIGHT GREATER SAPHENOUS VEIN;  Surgeon: Conrad Belleview, MD;  Location: [place];  Service: Vascular;  Laterality: Right;   CYST EXCISION     Abdomen   EYE SURGERY Bilateral    Cataract removal   INTRAMEDULLARY (IM) NAIL INTERTROCHANTERIC Left 02/04/2014   Procedure: INTRAMEDULLARY (IM) NAIL INTERTROCHANTRIC FEMORAL;  Surgeon: Mauri Pole, MD;  Location: [place];  Service: Orthopedics;  Laterality: Left;   IR GENERIC HISTORICAL  03/01/2016   IR ANGIO INTRA EXTRACRAN SEL COM CAROTID INNOMINATE UNI R MOD SED 03/01/2016 [name], MD [facility]   IR GENERIC HISTORICAL  03/01/2016   IR ENDOVASC INTRACRANIAL INF OTHER THAN THROMBO ART INC DIAG ANGIO 03/01/2016 [name], MD [facility]   IR GENERIC HISTORICAL  03/01/2016   IR INTRAVSC STENT CERV CAROTID W/O EMB-PROT MOD SED INC ANGIO 03/01/2016 [name], MD [facility]   IR GENERIC HISTORICAL  06/03/2016   IR RADIOLOGIST EVAL & MGMT 06/03/2016 [facility]   LOWER EXTREMITY ANGIOGRAPHY Left 02/09/2019   Procedure: LOWER EXTREMITY ANGIOGRAPHY;  Surgeon: Katha Cabal, MD;  Location: [place] [facility];  Service: Cardiovascular;  Laterality: Left;   LOWER EXTREMITY ANGIOGRAPHY Left 03/10/2019   Procedure: LOWER EXTREMITY ANGIOGRAPHY;  Surgeon: Katha Cabal,  MD;  Location: [place] [facility];  Service: Cardiovascular;  Laterality: Left;   LOWER EXTREMITY ANGIOGRAPHY Left 04/27/2019   Procedure: LOWER EXTREMITY ANGIOGRAPHY;  Surgeon: Katha Cabal, MD;  Location: [place] [facility];  Service: Cardiovascular;  Laterality: Left;   LOWER EXTREMITY ANGIOGRAPHY Left 06/08/2019   Procedure: Lower Extremity Angiography;  Surgeon: Katha Cabal, MD;  Location: [place] [facility];  Service: Cardiovascular;  Laterality: Left;   LOWER EXTREMITY ANGIOGRAPHY Left 04/03/2020   Procedure: Lower Extremity Angiography;  Surgeon: Algernon Huxley, MD;  Location: [place] [facility];  Service: Cardiovascular;  Laterality: Left;   LOWER EXTREMITY ANGIOGRAPHY Right 10/20/2020   Procedure: Lower Extremity Angiography;  Surgeon: Katha Cabal, MD;  Location: [place] [facility];  Service: Cardiovascular;  Laterality: Right;   ORIF TOE FRACTURE Right 02/08/2014   Procedure: OPEN REDUCTION INTERNAL FIXATION Right METATARSAL  FRACTURE ;  Surgeon: Wylene Simmer, MD;  Location: [place];  Service: Orthopedics;  Laterality: Right;   PERIPHERAL VASCULAR CATHETERIZATION N/A 12/28/2015   Procedure: Abdominal Aortogram w/Lower Extremity;  Surgeon: Conrad Moorpark, MD;  Location: [place] [facility];  Service: Cardiovascular;  Laterality: N/A;   RADIOLOGY WITH ANESTHESIA N/A 03/01/2016   Procedure: RADIOLOGY WITH ANESTHESIA;  Surgeon: [name], MD;  Location: [place];  Service: Radiology;  Laterality: N/A;   VEIN HARVEST Right 02/28/2016   Procedure: RIGHT GREATER SAPHENOUS VEIN HARVEST;  Surgeon: Conrad [place], MD;  Location: [place];  Service: Vascular;  Laterality: Right;    Family History  Problem Relation Age of Onset   Diabetes Other    Liver disease Mother    CVA Father    Diabetes Father    Hypertension Father     Allergies  Allergen Reactions   Iodine Anaphylaxis   Penicillins Anaphylaxis    Tolerates ceftriaxone, cefazolin Did it involve swelling of the  face/tongue/throat, SOB, or low BP? Unknown Did it involve sudden or severe rash/hives, skin peeling, or any reaction on the inside of your mouth or nose? Unknown Did you need to seek medical attention at a hospital or doctor's office? Unknown When did it last happen?      Unknown If all above answers are "NO", may proceed with cephalosporin use.    Shellfish Allergy Anaphylaxis   Sulfa Antibiotics Anaphylaxis   Sulfacetamide Sodium Anaphylaxis   Sulfasalazine Anaphylaxis   Eggs Or Egg-Derived Products     Other reaction(s): SHORTNESS OF BREATH   Morphine And Related Other (See Comments)    Altered mental status    CBC Latest Ref Rng & Units 10/23/2020 10/22/2020 10/21/2020  WBC 4.0 - 10.5 K/uL 10.3 10.6(H) 13.3(H)  Hemoglobin 12.0 - 15.0 g/dL 10.5(L) 9.7(L) 11.6(L)  Hematocrit 36.0 - 46.0 % 32.8(L) 30.6(L) 36.7  Platelets 150 - 400 K/uL 212 209 232      CMP     Component Value Date/Time   NA 138 10/23/2020 0400   K 3.8 10/23/2020 0400   CL 109 10/23/2020 0400   CO2 20 (L) 10/23/2020 0400   GLUCOSE 163 (H) 10/23/2020 0400   BUN 10 10/23/2020 0400   CREATININE 0.44 10/23/2020 0400   CALCIUM 8.9 10/23/2020 0400   PROT 6.0 (L) 10/19/2020 1204   ALBUMIN 3.3 (L) 10/19/2020 1204   AST 30 10/19/2020 1204   ALT 36 10/19/2020 1204   ALKPHOS 57 10/19/2020 1204   BILITOT 0.8 10/19/2020 1204   GFRNONAA >60 10/23/2020 0400   GFRAA >60 04/06/2020 0535     VAS [place] ABI WITH/WO TBI  Result Date: 11/27/2020  LOWER EXTREMITY DOPPLER STUDY Patient Name:  [name]  Date of Exam:   11/24/2020 Medical Rec #: [id number]      Accession #:    [id number] Date of Birth: 1941/02/21       Patient Gender: F Patient Age:   81Y Exam Location:  [facility] Procedure:      VAS [place] ABI WITH/WO TBI Referring Phys: [id number] [place] --------------------------------------------------------------------------------  Indications: Gangrene.  Comparison Study: 11/22/2019 Performing Technologist:  Charlane Ferretti RT (R)(VS)  Examination Guidelines: A complete evaluation includes at minimum, Doppler waveform signals and systolic blood pressure reading at the level of bilateral brachial, anterior tibial, and posterior tibial arteries, when vessel segments are accessible. Bilateral testing is considered an integral part of a complete examination. Photoelectric Plethysmograph (PPG) waveforms and toe systolic pressure readings are included as required and additional duplex testing as needed. Limited examinations for reoccurring indications may be performed as noted.  ABI Findings: +---------+------------------+-----+----------+----------+ Right    Rt Pressure (mmHg)IndexWaveform  Comment    +---------+------------------+-----+----------+----------+ Brachial 139                                         +---------+------------------+-----+----------+----------+ ATA      122               0.88 monophasic           +---------+------------------+-----+----------+----------+ PTA      111               0.80 monophasic           +---------+------------------+-----+----------+----------+ Great Toe                                 Gangrenous +---------+------------------+-----+----------+----------+ +---------+------------------+-----+----------+------------+ Left     Lt Pressure (mmHg)IndexWaveform  Comment      +---------+------------------+-----+----------+------------+ Brachial 136                                           +---------+------------------+-----+----------+------------+ ATA      70                0.50 monophasic             +---------+------------------+-----+----------+------------+ PTA                                       Not detected +---------+------------------+-----+----------+------------+ Great Toe                                 Amputated    +---------+------------------+-----+----------+------------+  +-------+-----------+-----------+------------+------------+ ABI/TBIToday's ABIToday's TBIPrevious ABIPrevious TBI +-------+-----------+-----------+------------+------------+ Right  .88                   1.06        .46          +-------+-----------+-----------+------------+------------+ Left   .50                   .87         AMP          +-------+-----------+-----------+------------+------------+ Bilateral ABIs appear decreased compared to prior study on 11/22/2019. Summary: Right: Resting right ankle-brachial index indicates mild right lower extremity arterial disease. Left: Resting left ankle-brachial index indicates moderate left lower extremity arterial disease. *See table(s) above for measurements and observations.  Electronically signed by Hortencia Pilar MD on 11/27/2020 at 9:56:28 AM.    Final        Assessment & Plan:   1. PAD (peripheral artery disease) ([place]) Recommend:  The  patient has evidence of severe atherosclerotic changes of both lower extremities with rest pain that is associated with preulcerative changes and impending tissue loss of the foot.  This represents a limb threatening ischemia and places the patient at the risk for limb loss.  Patient should undergo angiography of the lower extremities with the hope for intervention for limb salvage.  The risks and benefits as well as the alternative therapies was discussed in detail with the patient.  All questions were answered.  Patient agrees to proceed with angiography.  The patient will follow up with me in the office after the procedure.       2. Primary hypertension Continue antihypertensive medications as already ordered, these medications have been reviewed and there are no changes at this time.   3. Mixed hyperlipidemia Continue statin as ordered and reviewed, no changes at this time    Current Outpatient Medications on File Prior to Visit  Medication Sig Dispense Refill   acetaminophen (TYLENOL)  500 MG tablet Take 1,000 mg by mouth every 6 (six) hours as needed for mild pain.     amLODipine (NORVASC) 10 MG tablet Take 10 mg by mouth daily.     aspirin EC 81 MG EC tablet Take 1 tablet (81 mg total) by mouth daily.     atorvastatin (LIPITOR) 20 MG tablet Take 1 tablet (20 mg total) by mouth daily. (Patient taking differently: Take 20 mg by mouth at bedtime.) 30 tablet 11   budesonide (PULMICORT) 0.25 MG/2ML nebulizer solution Take 2 mLs (0.25 mg total) by nebulization 2 (two) times daily. 60 mL 12   carvedilol (COREG) 6.25 MG tablet Take 6.25 mg by mouth 2 (two) times daily with a meal.     cetirizine (ZYRTEC) 10 MG tablet Take 10 mg by mouth daily.     Cholecalciferol (VITAMIN D) 50 MCG (2000 UT) CAPS Take 2,000 Units by mouth daily.      clopidogrel (PLAVIX) 75 MG tablet Take 1 tablet (75 mg total) by mouth daily with breakfast. 30 tablet 0   docusate sodium (COLACE) 100 MG capsule Take 100 mg by mouth 2 (two) times daily.     fluticasone (FLONASE) 50 MCG/ACT nasal spray Place 1 spray into both nostrils daily as needed for allergies.      hydrALAZINE (APRESOLINE) 25 MG tablet Take 1 tablet (25 mg total) by mouth every 8 (eight) hours. (Patient taking differently: Take 25 mg by mouth 3 (three) times daily.) 60 tablet 0   insulin glargine (LANTUS) 100 UNIT/ML injection Inject 0.15 mLs (15 Units total) into the skin at bedtime. (Patient taking differently: Inject 18 Units into the skin at bedtime.) 1 vial 0   magnesium oxide (MAG-OX) 400 MG tablet Take 400 mg by mouth 2 (two) times daily.      modafinil (PROVIGIL) 200 MG tablet Take 200 mg by mouth daily.     omeprazole (PRILOSEC) 20 MG capsule Take 1 capsule (20 mg total) by mouth daily.     polyethylene glycol (MIRALAX / GLYCOLAX) packet Take 17 g by mouth 2 (two) times daily. (Patient taking differently: Take 17 g by mouth daily as needed for moderate constipation.) 14 each 0   potassium chloride SA (K-DUR,KLOR-CON) 20 MEQ tablet Take 20  mEq by mouth daily.     torsemide (DEMADEX) 20 MG tablet Take 1 tablet (20 mg total) by mouth 2 (two) times daily.     Vitamin E 180 MG CAPS Take 180 mg by mouth daily.  No current facility-administered medications on file prior to visit.    There are no Patient Instructions on file for this visit. No follow-ups on file.   Kris Hartmann, NP

## 2021-01-20 ENCOUNTER — Encounter (INDEPENDENT_AMBULATORY_CARE_PROVIDER_SITE_OTHER): Payer: Self-pay | Admitting: Nurse Practitioner

## 2021-01-22 ENCOUNTER — Other Ambulatory Visit: Payer: Self-pay

## 2021-01-22 ENCOUNTER — Other Ambulatory Visit (INDEPENDENT_AMBULATORY_CARE_PROVIDER_SITE_OTHER): Payer: Self-pay | Admitting: Nurse Practitioner

## 2021-01-22 ENCOUNTER — Ambulatory Visit: Payer: Medicare HMO

## 2021-01-22 DIAGNOSIS — R2689 Other abnormalities of gait and mobility: Secondary | ICD-10-CM

## 2021-01-22 DIAGNOSIS — M6281 Muscle weakness (generalized): Secondary | ICD-10-CM

## 2021-01-22 DIAGNOSIS — R293 Abnormal posture: Secondary | ICD-10-CM

## 2021-01-22 NOTE — Therapy (Signed)
[place] MAIN [facility] SERVICES [address] [place], [place], [address] Phone: [phone]   Fax:  [phone]  Physical Therapy Treatment  Patient Details  Name: [name] MRN: [id number] Date of Birth: [birth year] Referring Provider (PT): [name]   Encounter Date: 01/22/2021   PT End of Session - 01/22/21 1234     Visit Number 3    Number of Visits 24    Date for PT Re-Evaluation 04/04/21    Authorization Type 3/10 eval 6/8    PT Start Time 0930    PT Stop Time 1015    PT Time Calculation (min) 45 min    Equipment Utilized During Treatment Gait belt    Activity Tolerance Patient limited by fatigue;Patient limited by pain    Behavior During Therapy Agitated             Past Medical History:  Diagnosis Date   Acute heart failure ([place])    Aortic stenosis    CHF (congestive heart failure) ([place])    Complication of anesthesia    Hard to wake patient up after having anesthesia   Diabetes mellitus without complication ([place])    Diabetic neuropathy ([place])    Takes Lyrica   Edema of both legs    Takes Lasix   GERD (gastroesophageal reflux disease)    High cholesterol    HTN (hypertension)    Hypertension    PAD (peripheral artery disease) ([place])    Shortness of breath dyspnea    Spasm of back muscles    Stroke ([place])    Wound, open    Right great toe    Past Surgical History:  Procedure Laterality Date   AMPUTATION TOE Left 06/09/2019   Procedure: Left third toe and partial great toe amputation and debridement;  Surgeon: Katha Cabal, MD;  Location: [facility];  Service: Vascular;  Laterality: Left;   AMPUTATION TOE Left 04/06/2020   Procedure: AMPUTATION LEFT SECOND TOE;  Surgeon: Samara Deist, DPM;  Location: [facility];  Service: Podiatry;  Laterality: Left;   BYPASS GRAFT POPLITEAL TO TIBIAL Right 02/28/2016   Procedure: BYPASS GRAFT RIGHT BELOW KNEE POPLITEAL TO PERONEAL USING REVERSED RIGHT GREATER SAPHENOUS  VEIN;  Surgeon: Conrad Camanche, MD;  Location: [place];  Service: Vascular;  Laterality: Right;   CYST EXCISION     Abdomen   EYE SURGERY Bilateral    Cataract removal   INTRAMEDULLARY (IM) NAIL INTERTROCHANTERIC Left 02/04/2014   Procedure: INTRAMEDULLARY (IM) NAIL INTERTROCHANTRIC FEMORAL;  Surgeon: Mauri Pole, MD;  Location: [place];  Service: Orthopedics;  Laterality: Left;   IR GENERIC HISTORICAL  03/01/2016   IR ANGIO INTRA EXTRACRAN SEL COM CAROTID INNOMINATE UNI R MOD SED 03/01/2016 [name], MD [facility]   IR GENERIC HISTORICAL  03/01/2016   IR ENDOVASC INTRACRANIAL INF OTHER THAN THROMBO ART INC DIAG ANGIO 03/01/2016 [name], MD [facility]   IR GENERIC HISTORICAL  03/01/2016   IR INTRAVSC STENT CERV CAROTID W/O EMB-PROT MOD SED INC ANGIO 03/01/2016 [name], MD [facility]   IR GENERIC HISTORICAL  06/03/2016   IR RADIOLOGIST EVAL & MGMT 06/03/2016 [facility]   LOWER EXTREMITY ANGIOGRAPHY Left 02/09/2019   Procedure: LOWER EXTREMITY ANGIOGRAPHY;  Surgeon: Katha Cabal, MD;  Location: [place] [facility];  Service: Cardiovascular;  Laterality: Left;   LOWER EXTREMITY ANGIOGRAPHY Left 03/10/2019   Procedure: LOWER EXTREMITY ANGIOGRAPHY;  Surgeon: Katha Cabal, MD;  Location: [place]  [facility];  Service: Cardiovascular;  Laterality: Left;   LOWER EXTREMITY ANGIOGRAPHY Left 04/27/2019   Procedure: LOWER EXTREMITY ANGIOGRAPHY;  Surgeon: Katha Cabal, MD;  Location: [place] [facility];  Service: Cardiovascular;  Laterality: Left;   LOWER EXTREMITY ANGIOGRAPHY Left 06/08/2019   Procedure: Lower Extremity Angiography;  Surgeon: Katha Cabal, MD;  Location: [place] [facility] LAB;  Service: Cardiovascular;  Laterality: Left;   LOWER EXTREMITY ANGIOGRAPHY Left 04/03/2020   Procedure: Lower Extremity Angiography;  Surgeon: Algernon Huxley, MD;  Location: [place] [facility];  Service: Cardiovascular;  Laterality: Left;   LOWER EXTREMITY  ANGIOGRAPHY Right 10/20/2020   Procedure: Lower Extremity Angiography;  Surgeon: Katha Cabal, MD;  Location: [place] [facility];  Service: Cardiovascular;  Laterality: Right;   ORIF TOE FRACTURE Right 02/08/2014   Procedure: OPEN REDUCTION INTERNAL FIXATION Right METATARSAL  FRACTURE ;  Surgeon: Wylene Simmer, MD;  Location: [place];  Service: Orthopedics;  Laterality: Right;   PERIPHERAL VASCULAR CATHETERIZATION N/A 12/28/2015   Procedure: Abdominal Aortogram w/Lower Extremity;  Surgeon: Conrad Linganore, MD;  Location: [place] [facility];  Service: Cardiovascular;  Laterality: N/A;   RADIOLOGY WITH ANESTHESIA N/A 03/01/2016   Procedure: RADIOLOGY WITH ANESTHESIA;  Surgeon: [name], MD;  Location: [place];  Service: Radiology;  Laterality: N/A;   VEIN HARVEST Right 02/28/2016   Procedure: RIGHT GREATER SAPHENOUS VEIN HARVEST;  Surgeon: Conrad Point Hope, MD;  Location: [place];  Service: Vascular;  Laterality: Right;    There were no vitals filed for this visit.   Subjective Assessment - 01/22/21 1227     Subjective Patient's husband reports patient will be having a vascular procedure tomorrow. Will miss next session.    Patient is accompained by: Family member    Pertinent History [facility] outpatient for core strengthening and working toward ambulation.   Patient has deconditioning, amputation of 2nd toe, pressure ulcer of sacral region stage II, amputation of third toe, hypercapnic respiratory failure, peripheral neuropathy. PMH includes acute HF, CHF, DM, neuropathy, GERD, HTN, PAD, stroke, COPD, ESBL, CVA. Patient has a caregiver 4 hours a day for three times a week. Patient is dependent for all mobility, transfers, toileting and ADL needs. Patient has not ambulated in three years and wishes to ambulate. At home patient utilizes hoyer lift to wheelchair but is not able to propel herself.   No toileting equipment at pt's disposal besides disposable briefs that pt can use due to deconditioned state she  is in now. Husband cleans wife daily after soiling briefs for BM and urination. 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    Limitations Lifting;Standing;Walking;House hold activities;Sitting    How long can you sit comfortably? requires back rest; limited by pain in buttocks    How long can you stand comfortably? Unable    How long can you walk comfortably? Unable    Patient Stated Goals To transfer    Currently in Pain? Yes    Pain Score 6     Pain Location Foot    Pain Orientation Right    Pain Descriptors / Indicators Aching    Pain Type Chronic pain    Pain Onset More than a month ago    Pain Frequency Intermittent              hoyer lift transfer; requires x2 person assistance for positioning.   Supine:  Heel slide AAROM 10x each side;  Hip abduction 10x  each LE Head clearance 10x with BUE support Bilateral arms raise 10x Bilateral arms abduction 10x  Knee flexion with overpressure 60 second holds.   Supine to sidelie transition 5x each side with decreasing assistance from PT each turn; more challenging to the right.   Sidelie:  Hip extension AAROM 10x each LE.    Seated: Right trunk lean 10x very challenging for patient PVC pipe trunk flexion/extension 10x; requires rest break after 5x long arc quad 10x each LE   Pt educated throughout session about proper posture and technique with exercises. Improved exercise technique, movement at target joints, use of target muscles after min to mod verbal, visual, tactile cues   Patient arrived on time to PT session allowing for use of hoyer lift.  She is dependent with hoyer lift but requires assistance for rolling over. She is fatigued quickly requiring rest breaks requiring heavy encouragement. Patient will benefit from skilled physical therapy to improve tranfers, strength, and range of motion to reduce caregiver burden.                    PT Education -  01/22/21 1234     Education Details exercise technique, body mechanics    Person(s) Educated Patient    Methods Explanation;Demonstration;Tactile cues;Verbal cues    Comprehension Verbalized understanding;Returned demonstration;Verbal cues required;Tactile cues required              PT Short Term Goals - 01/10/21 1121       PT SHORT TERM GOAL #1   Title Patient will be independent in home exercise program to improve strength/mobility for better functional independence with ADLs.    Baseline 6/8: HEP given    Time 6    Period Weeks    Status New    Target Date 02/21/21               PT Long Term Goals - 01/10/21 1122       PT LONG TERM GOAL #1   Title Patient will increase FOTO score to equal to or greater than   31%  to demonstrate statistically significant improvement in mobility and quality of life.    Baseline 6/8: 12%    Time 12    Period Weeks    Status New    Target Date 04/04/21      PT LONG TERM GOAL #2   Title Patient will roll L and R in bed with mod A for decreased caregiver burden for ADLs and toileting.    Baseline 6/8: dependent    Time 12    Period Weeks    Status New    Target Date 04/04/21      PT LONG TERM GOAL #3   Title Patient will sit EOB with min A for 10 minutes for ADL performance and pressure relief.    Baseline 6/8: unable    Time 12    Period Weeks    Status New    Target Date 04/04/21      PT LONG TERM GOAL #4   Title Patient will assist in transfer from bed to chair with mod assistance for decreased caregiver burden.    Baseline 6/8: dependent    Time 12    Period Weeks    Status New    Target Date 04/04/21                   Plan - 01/22/21 1241     Clinical Impression Statement Patient arrived  on time to PT session allowing for use of hoyer lift.  She is dependent with hoyer lift but requires assistance for rolling over. She is fatigued quickly requiring rest breaks requiring heavy encouragement. She is  unable to perform edge of bed stabilization this session but will benefit from it in future session when staffing allows additional hands.  Patient will benefit from skilled physical therapy to improve tranfers, strength, and range of motion to reduce caregiver burden.    Personal Factors and Comorbidities Age;Time since onset of injury/illness/exacerbation;Comorbidity 3+;Past/Current Experience;Education;Social Background;Transportation    Comorbidities diastolic congestive heart failure, diabetes mellitus, diabetic neuropathy, hypertension, hyperlipidemia, GERD, peripheral vascular disease, CVA. on 9/2 had amputation of L foot second toe MTP joint and LLE revascularization.    Examination-Activity Limitations Bathing;Hygiene/Grooming;Squat;Bed Mobility;Stairs;Lift;Bend;Locomotion Level;Stand;Caring for [employer];Toileting;Carry;Self Feeding;Transfers;Continence;Dressing;Other    Examination-Participation Restrictions Cleaning;Shop;Community Activity;Meal Prep;Driving;Other    Stability/Clinical Decision Making Unstable/Unpredictable    Rehab Potential Poor    PT Frequency 2x / week    PT Duration 12 weeks    PT Treatment/Interventions ADLs/Self Care Home Management;DME Instruction;Functional mobility training;Therapeutic activities;Therapeutic exercise;Neuromuscular re-education;Patient/family education;Wheelchair mobility training;Passive range of motion;Energy conservation;Cryotherapy;Biofeedback;Electrical Stimulation;Iontophoresis 4mg /ml Dexamethasone;Moist Heat;Traction;Ultrasound;Gait training;Stair training;Cognitive remediation;Balance training;Orthotic Fit/Training;Manual techniques;Manual lymph drainage;Splinting;Taping;Vasopneumatic Device;Vestibular;Visual/perceptual remediation/compensation    PT Next Visit Plan hoyer lift to chair    PT Home Exercise Plan see above    Consulted and Agree with Plan of Care Patient;Family member/caregiver    Family Member Consulted Spouse              Patient will benefit from skilled therapeutic intervention in order to improve the following deficits and impairments:  Decreased cognition, Decreased skin integrity, Impaired sensation, Decreased mobility, Postural dysfunction, Decreased activity tolerance, Decreased endurance, Decreased range of motion, Decreased strength, Impaired perceived functional ability, Decreased balance, Decreased safety awareness, Increased edema, Impaired flexibility, Obesity, Decreased coordination, Cardiopulmonary status limiting activity, Abnormal gait, Impaired UE functional use, Impaired tone, Improper body mechanics, Pain, Difficulty walking  Visit Diagnosis: Muscle weakness (generalized)  Other abnormalities of gait and mobility  Abnormal posture     Problem List Patient Active Problem List   Diagnosis Date Noted   COPD (chronic obstructive pulmonary disease) (HCC) 10/20/2020   GERD (gastroesophageal reflux disease) 10/20/2020   Physical deconditioning 10/20/2020   Cellulitis 10/19/2020   Necrotic toes (HCC)    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)    Diabetic neuropathy (HCC)    Gangrene of toe of both feet ([place]) 06/06/2019   PAD (peripheral artery disease) ([place]) 04/27/2019   ESBL (extended spectrum beta-lactamase) producing bacteria infection 12/25/2018   PVD (peripheral vascular disease) ([place]) 09/12/2016   Ulcer of right lower extremity ([place]) 08/29/2016   Acute on chronic diastolic heart failure ([place]) 07/08/2016   HTN (hypertension) 07/05/2016   Sensorineural hearing loss (SNHL), 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   Chronic diastolic CHF (congestive heart failure) ([place]) 03/10/2016   HLD (hyperlipidemia) 03/09/2016   Acute respiratory acidosis 03/05/2016   Anemia, iron deficiency 03/03/2016   Cerebrovascular accident (CVA) due to thrombosis of precerebral artery ([place]) 03/01/2016   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 ([place]) 03/01/2016   Atherosclerosis of  native arteries of the extremities with gangrene ([place]) 12/08/2015   Anemia, chronic disease 04/27/2014   Unspecified hereditary and idiopathic peripheral neuropathy 03/14/2014   DM2 (diabetes mellitus, type 2) ([place]) 02/14/2014   Aortic stenosis 02/04/2014   Closed left subtrochanteric femur fracture ([place]) 02/03/2014   Hip fracture, left ([place]) 02/03/2014   Fibula fracture 02/03/2014  MTP instability 02/03/2014   Hip fracture ([place]) 02/03/2014    Janna Arch, PT, DPT  01/22/2021, 12:43 PM  [place] MAIN [facility] SERVICES [address] [place], [place], [address] Phone: [phone]   Fax:  [phone]  Name: KYTZIA GIENGER MRN: [id number] Date of Birth: October 25, 1940

## 2021-01-23 ENCOUNTER — Encounter
Admission: RE | Disposition: A | Discharge status: Home / Self Care | Payer: Self-pay | Source: Home / Self Care | Attending: Vascular Surgery

## 2021-01-23 ENCOUNTER — Ambulatory Visit
Admission: RE | Admit: 2021-01-23 | Discharge: 2021-01-23 | Disposition: A | Discharge status: Home / Self Care | Payer: Medicare HMO | Attending: Vascular Surgery | Admitting: Vascular Surgery

## 2021-01-23 ENCOUNTER — Other Ambulatory Visit: Payer: Self-pay

## 2021-01-23 ENCOUNTER — Encounter: Payer: Self-pay | Admitting: Vascular Surgery

## 2021-01-23 DIAGNOSIS — Z885 Allergy status to narcotic agent status: Secondary | ICD-10-CM | POA: Diagnosis not present

## 2021-01-23 DIAGNOSIS — E782 Mixed hyperlipidemia: Secondary | ICD-10-CM | POA: Insufficient documentation

## 2021-01-23 DIAGNOSIS — Z79899 Other long term (current) drug therapy: Secondary | ICD-10-CM | POA: Insufficient documentation

## 2021-01-23 DIAGNOSIS — I70222 Atherosclerosis of native arteries of extremities with rest pain, left leg: Secondary | ICD-10-CM | POA: Insufficient documentation

## 2021-01-23 DIAGNOSIS — Z882 Allergy status to sulfonamides status: Secondary | ICD-10-CM | POA: Diagnosis not present

## 2021-01-23 DIAGNOSIS — Z7982 Long term (current) use of aspirin: Secondary | ICD-10-CM | POA: Insufficient documentation

## 2021-01-23 DIAGNOSIS — E1151 Type 2 diabetes mellitus with diabetic peripheral angiopathy without gangrene: Secondary | ICD-10-CM | POA: Insufficient documentation

## 2021-01-23 DIAGNOSIS — Z7902 Long term (current) use of antithrombotics/antiplatelets: Secondary | ICD-10-CM | POA: Diagnosis not present

## 2021-01-23 DIAGNOSIS — L97209 Non-pressure chronic ulcer of unspecified calf with unspecified severity: Secondary | ICD-10-CM

## 2021-01-23 DIAGNOSIS — Z91013 Allergy to seafood: Secondary | ICD-10-CM | POA: Insufficient documentation

## 2021-01-23 DIAGNOSIS — Z880 Allergy status to penicillin: Secondary | ICD-10-CM | POA: Diagnosis not present

## 2021-01-23 DIAGNOSIS — Z91012 Allergy to eggs: Secondary | ICD-10-CM | POA: Insufficient documentation

## 2021-01-23 DIAGNOSIS — Z794 Long term (current) use of insulin: Secondary | ICD-10-CM | POA: Insufficient documentation

## 2021-01-23 DIAGNOSIS — I10 Essential (primary) hypertension: Secondary | ICD-10-CM | POA: Diagnosis not present

## 2021-01-23 HISTORY — PX: LOWER EXTREMITY ANGIOGRAPHY: CATH118251

## 2021-01-23 LAB — CREATININE, SERUM
Creatinine, Ser: 0.61 mg/dL (ref 0.44–1.00)
GFR, Estimated: >60 mL/min (ref >60)

## 2021-01-23 LAB — BUN: BUN: 15 mg/dL (ref 8–23)

## 2021-01-23 LAB — GLUCOSE, CAPILLARY
Glucose-Capillary: 151 mg/dL — ABNORMAL HIGH (ref 70–99)
Glucose-Capillary: 146 mg/dL — ABNORMAL HIGH (ref 70–99)

## 2021-01-23 SURGERY — LOWER EXTREMITY ANGIOGRAPHY
Anesthesia: Moderate Sedation | Site: Leg Lower | Laterality: Left

## 2021-01-23 MED ORDER — FENTANYL CITRATE (PF) 100 MCG/2ML IJ SOLN: 12.5000 ug | Freq: Once | INTRAMUSCULAR | Status: DC | PRN

## 2021-01-23 MED ORDER — HYDRALAZINE HCL 20 MG/ML IJ SOLN: 5.0000 mg | INTRAMUSCULAR | Status: DC | PRN

## 2021-01-23 MED ORDER — SODIUM CHLORIDE 0.9 % IV SOLN
250.0000 mL | INTRAVENOUS | Status: DC | PRN
Start: 2021-01-23 — End: 2021-01-24

## 2021-01-23 MED ORDER — MORPHINE SULFATE (PF) 4 MG/ML IV SOLN: 2.0000 mg | INTRAVENOUS | Status: DC | PRN

## 2021-01-23 MED ORDER — LABETALOL HCL 5 MG/ML IV SOLN: 10.0000 mg | INTRAVENOUS | Status: DC | PRN

## 2021-01-23 MED ORDER — MIDAZOLAM HCL 2 MG/2ML IJ SOLN
INTRAMUSCULAR | Status: AC
  Filled 2021-01-23: qty 2

## 2021-01-23 MED ORDER — METHYLPREDNISOLONE SODIUM SUCC 125 MG IJ SOLR
125.0000 mg | Freq: Once | INTRAMUSCULAR | Status: AC | PRN
  Administered 2021-01-23: 125 mg via INTRAVENOUS

## 2021-01-23 MED ORDER — DIPHENHYDRAMINE HCL 50 MG/ML IJ SOLN
INTRAMUSCULAR | Status: AC
  Filled 2021-01-23: qty 1

## 2021-01-23 MED ORDER — FAMOTIDINE 20 MG PO TABS
40.0000 mg | ORAL_TABLET | Freq: Once | ORAL | Status: AC | PRN
  Administered 2021-01-23: 40 mg via ORAL

## 2021-01-23 MED ORDER — CLINDAMYCIN PHOSPHATE 300 MG/50ML IV SOLN
INTRAVENOUS | Status: AC
  Filled 2021-01-23: qty 50

## 2021-01-23 MED ORDER — OXYCODONE HCL 5 MG PO TABS: 5.0000 mg | ORAL_TABLET | ORAL | Status: DC | PRN

## 2021-01-23 MED ORDER — FENTANYL CITRATE (PF) 100 MCG/2ML IJ SOLN
INTRAMUSCULAR | Status: AC
  Filled 2021-01-23: qty 2

## 2021-01-23 MED ORDER — ONDANSETRON HCL 4 MG/2ML IJ SOLN: 4.0000 mg | Freq: Four times a day (QID) | INTRAMUSCULAR | Status: DC | PRN

## 2021-01-23 MED ORDER — ACETAMINOPHEN 325 MG PO TABS
ORAL_TABLET | ORAL | Status: AC
  Administered 2021-01-23: 650 mg via ORAL
  Filled 2021-01-23: qty 2

## 2021-01-23 MED ORDER — ACETAMINOPHEN 325 MG PO TABS: 650.0000 mg | ORAL_TABLET | ORAL | Status: DC | PRN

## 2021-01-23 MED ORDER — HEPARIN SODIUM (PORCINE) 1000 UNIT/ML IJ SOLN
INTRAMUSCULAR | Status: AC
  Filled 2021-01-23: qty 1

## 2021-01-23 MED ORDER — FAMOTIDINE 20 MG PO TABS
ORAL_TABLET | ORAL | Status: AC
  Filled 2021-01-23: qty 1

## 2021-01-23 MED ORDER — ALTEPLASE 2 MG IJ SOLR
INTRAMUSCULAR | Status: DC | PRN
  Administered 2021-01-23: 6 mg

## 2021-01-23 MED ORDER — MIDAZOLAM HCL 2 MG/2ML IJ SOLN
INTRAMUSCULAR | Status: DC | PRN
  Administered 2021-01-23: 1 mg via INTRAVENOUS
  Administered 2021-01-23: 2 mg via INTRAVENOUS
  Administered 2021-01-23: 1 mg via INTRAVENOUS

## 2021-01-23 MED ORDER — IODIXANOL 320 MG/ML IV SOLN
INTRAVENOUS | Status: DC | PRN
  Administered 2021-01-23: 65 mL via INTRA_ARTERIAL

## 2021-01-23 MED ORDER — MIDAZOLAM HCL 2 MG/ML PO SYRP
8.0000 mg | ORAL_SOLUTION | Freq: Once | ORAL | Status: DC | PRN
Start: 2021-01-23 — End: 2021-01-24

## 2021-01-23 MED ORDER — DIPHENHYDRAMINE HCL 50 MG/ML IJ SOLN
50.0000 mg | Freq: Once | INTRAMUSCULAR | Status: AC | PRN
  Administered 2021-01-23: 50 mg via INTRAVENOUS

## 2021-01-23 MED ORDER — CLINDAMYCIN PHOSPHATE 300 MG/50ML IV SOLN
300.0000 mg | Freq: Once | INTRAVENOUS | Status: AC
  Administered 2021-01-23: 300 mg via INTRAVENOUS

## 2021-01-23 MED ORDER — SODIUM CHLORIDE 0.9% FLUSH: 3.0000 mL | INTRAVENOUS | Status: DC | PRN

## 2021-01-23 MED ORDER — SODIUM CHLORIDE 0.9 % IV SOLN: INTRAVENOUS | Status: DC

## 2021-01-23 MED ORDER — METHYLPREDNISOLONE SODIUM SUCC 125 MG IJ SOLR
INTRAMUSCULAR | Status: AC
  Filled 2021-01-23: qty 2

## 2021-01-23 MED ORDER — HEPARIN SODIUM (PORCINE) 1000 UNIT/ML IJ SOLN
INTRAMUSCULAR | Status: DC | PRN
  Administered 2021-01-23: 4000 [IU] via INTRAVENOUS

## 2021-01-23 MED ORDER — FENTANYL CITRATE (PF) 100 MCG/2ML IJ SOLN
INTRAMUSCULAR | Status: DC | PRN
  Administered 2021-01-23: 50 ug via INTRAVENOUS
  Administered 2021-01-23 (×2): 25 ug via INTRAVENOUS

## 2021-01-23 MED ORDER — SODIUM CHLORIDE 0.9% FLUSH: 3.0000 mL | Freq: Two times a day (BID) | INTRAVENOUS | Status: DC

## 2021-01-23 SURGICAL SUPPLY — 32 items
BALLN LUTONIX  018 4X60X130 (BALLOONS) ×2
BALLN LUTONIX 018 4X220X130 (BALLOONS) ×3
BALLN LUTONIX 018 4X300X130 (BALLOONS) ×3
BALLN LUTONIX 018 4X60X130 (BALLOONS) ×1
BALLN ULTRASCORE 014 2X100X150 (BALLOONS) ×3
BALLN ULTRASCORE 014 3X100X150 (BALLOONS) ×3
BALLN ULTRVRSE 5X80X150 (BALLOONS) ×3
BALLOON LUTONIX 018 4X220X130 (BALLOONS) ×1 IMPLANT
BALLOON LUTONIX 018 4X300X130 (BALLOONS) ×1 IMPLANT
BALLOON LUTONIX 018 4X60X130 (BALLOONS) ×1 IMPLANT
BALLOON ULTRSCRE 014 2X100X150 (BALLOONS) ×1 IMPLANT
BALLOON ULTRSCRE 014 3X100X150 (BALLOONS) ×1 IMPLANT
BALLOON ULTRVRSE 5X80X150 (BALLOONS) ×1 IMPLANT
CANISTER PENUMBRA ENGINE (MISCELLANEOUS) ×3 IMPLANT
CATH BEACON 5 .038 100 VERT TP (CATHETERS) ×3 IMPLANT
CATH INDIGO CAT6 KIT (CATHETERS) ×3 IMPLANT
CATH INFUS 135CMX30CM (CATHETERS) ×3 IMPLANT
CATH TEMPO 5F RIM 65CM (CATHETERS) ×3 IMPLANT
COVER PROBE U/S 5X48 (MISCELLANEOUS) ×3 IMPLANT
DEVICE STARCLOSE SE CLOSURE (Vascular Products) ×3 IMPLANT
GLIDEWIRE ADV .035X260CM (WIRE) ×3 IMPLANT
KIT ENCORE 26 ADVANTAGE (KITS) ×3 IMPLANT
KIT MICROPUNCTURE NIT STIFF (SHEATH) ×3 IMPLANT
NEEDLE ENTRY 21GA 7CM ECHOTIP (NEEDLE) ×3 IMPLANT
PACK ANGIOGRAPHY (CUSTOM PROCEDURE TRAY) ×3 IMPLANT
SHEATH BRITE TIP 5FRX11 (SHEATH) ×3 IMPLANT
SHEATH PINNACLE ST 6F 65CM (SHEATH) ×3 IMPLANT
STENT LIFESTENT 5F 6X150X135 (Permanent Stent) ×3 IMPLANT
SYR MEDRAD MARK 7 150ML (SYRINGE) ×3 IMPLANT
TUBING CONTRAST HIGH PRESS 72 (TUBING) ×3 IMPLANT
WIRE GUIDERIGHT .035X150 (WIRE) ×3 IMPLANT
WIRE RUNTHROUGH .014X300CM (WIRE) ×3 IMPLANT

## 2021-01-23 NOTE — Op Note (Signed)
[place] [facility]  Percutaneous Study/Intervention Procedural Note   Date of Surgery: 01/23/2021  Surgeon:  Hortencia Pilar  Pre-operative Diagnosis: Atherosclerotic occlusive disease bilateral lower extremities with rest pain of the left lower extremity  Post-operative diagnosis:  Same  Procedure(s) Performed:             1.  Introduction catheter into left lower extremity 3rd order catheter placement               2.    Contrast injection left lower extremity for distal runoff             3.  Percutaneous transluminal angioplasty and stent placement left superficial femoral and popliteal arteries to 4 to 5 mm             4.  Percutaneous transluminal angioplasty left anterior tibial to 3 mm             5.  Mechanical thrombectomy with the penumbra CAT 6 device left superficial femoral, popliteal and anterior tibial arteries.                6.  Star close closure right common femoral arteriotomy  Anesthesia: Conscious sedation was administered under my direct supervision by the interventional radiology RN. IV Versed plus fentanyl were utilized. Continuous ECG, pulse oximetry and blood pressure was monitored throughout the entire procedure.  Conscious sedation was for a total of 1 hour 46 minutes and 39 seconds.  Sheath: 6 French destination right common femoral retrograde  Contrast: 65 cc  Fluoroscopy Time: 13.3 minutes  Indications:  [name] presents with severe rest pain of the left lower extremity.  Noninvasive studies demonstrate occlusion of the distal SFA with no flow in the tibials.  ABIs demonstrate tracings that are flat for the toes.  This suggests the patient is having limb threatening ischemia. The risks and benefits are reviewed all questions answered patient agrees to proceed.  Procedure: SURIYAH VERGARA is a 80 y.o. y.o. female who was identified and appropriate procedural time out was performed.  The patient was then placed supine on the table and  prepped and draped in the usual sterile fashion.    Ultrasound was placed in the sterile sleeve and the right groin was evaluated the right common femoral artery was echolucent and pulsatile indicating patency.  Image was recorded for the permanent record and under real-time visualization a microneedle was inserted into the common femoral artery followed by the microwire and then the micro-sheath.  A J-wire was then advanced through the micro-sheath and a  5 [place] sheath was then inserted over a J-wire. J-wire was then advanced and a 5 French rim catheter was positioned at the level of the aortic bifurcation and a RAO view of the pelvis was obtained.  Subsequently a rim catheter with the stiff angle Glidewire was used to cross the aortic bifurcation the catheter wire were advanced down into the distal left external iliac artery. Oblique view of the femoral bifurcation was then obtained and subsequently the wire was reintroduced and the pigtail catheter negotiated into the proximal SFA representing third order catheter placement. Distal runoff was then performed.  Diagnostic interpretation: The aortic bifurcation and visualized segments of the right common iliac artery are widely patent.  The left common and external iliac arteries are widely patent.  The left common femoral profunda femoris and proximal one third of the superficial femoral arteries demonstrate diffuse disease but they are widely patent and there  are no hemodynamically significant stenoses.  Beginning in the midportion of the superficial femoral there are tandem lesions of greater than 90%.  At [facility] canal the superficial femoral artery occludes in the entire popliteal artery is occluded.  The trifurcation is occluded the proximal 3 to 4 cm of the anterior tibial artery is occluded.  Beyond this the anterior tibial artery is reconstituted but there are 2 greater than 80% lesions extending over approximately 10 cm.  Distal to these lesions  the anterior tibial is patent and fills the dorsalis pedis and the pedal arch.  5000 units of heparin was then given and allowed to circulate and a 6 [place] destination sheath was advanced up and over the bifurcation and positioned in the proximal one third of the superficial femoral artery  Kumpe catheter and advantage Glidewire were then negotiated down into the distal popliteal and then the wire catheter combination was advanced into the anterior tibial.  Hand injection contrast demonstrated intraluminal positioning and the distal tibial anatomy in detail.   A infusion catheter with a 30 cm infusion length is then advanced over the wire and the wire remove the obturator wire put in and 6 mg of tPA is infused.  This is allowed to dwell for approximately 30 minutes.  Next the penumbra CAT 6 device is prepped on the table.  A 0.014 run-through wire was advanced through the infusion catheter and the infusion catheter was removed.  The penumbra CAT 6 device is then used to perform mechanical thrombectomy of the SFA popliteal and proximal anterior tibial.  After approximately 4 passes with retrieval of thrombotic material the CAT 6 was removed and hand-injection of contrast is performed demonstrating there is now recanalization of the SFA popliteal and anterior tibial with forward flow.  A 2.5 x 100 ultra score balloon was used to angioplasty the proximal anterior tibial I then did a second inflation including the popliteal as well. Each inflation was for 1 minute at 10 atm.  Next a 4 mm x 220 mm Lutonix drug-eluting balloon is positioned with its distal marker in the distal popliteal extending into the mid SFA.  Angioplasty is performed to 10 atm for 2 full minutes.  Follow-up imaging now demonstrates greater than 50% residual stenosis in the distalmost aspect of the SFA as well as within the midportion of the popliteal.  A 6 mm x 150 mm life stent is then deployed in the distal SFA and popliteal.  It is  postdilated with a second 4 mm x 220 mm Lutonix drug-eluting balloon inflated to 12 atm for approximately 1 minute.  Follow-up imaging demonstrates the stent is patent however there are 2 segments of the stent that are under dilated these are separated by approximately 2 cm and appear to be approximately 2 cm in length each and therefore I selected a 5 mm x 80 mm Ultraverse balloon and angioplastied these area.  Inflation of the Ultraverse balloon was to 12 atm for approximately 1 minute.  Follow-up imaging demonstrated an area of haziness within the stent I reintroduced the CAT 6.  Several passes were made follow-up imaging demonstrated this area was still there and therefore I elected to reballoon it with the 5 mm x 80 mm Ultraverse.  This time the inflation was to 16 atm for 1 minute.  Also identified was an area in the origin of the anterior tibial extending approximately 15 mm into the anterior tibial.  This was greater than 40% residual stenosis and therefore advanced  a 4 mm x 60 mm Lutonix drug-eluting balloon which was inflated to 6 atm for approximately 2 minutes.  Follow-up imaging of the distal SFA popliteal and proximal anterior tibial was then performed and demonstrated resolution of these lesions there is now less than 10% residual stenosis throughout the entire system.  Distal runoff of the anterior tibial was then obtained and it was preserved with rapid flow of contrast into the foot  After review of these images the sheath is pulled into the right external iliac oblique of the common femoral is obtained and a Star close device deployed. There no immediate Complications.  Findings:   The aortic bifurcation and visualized segments of the right common iliac artery are widely patent.  The left common and external iliac arteries are widely patent.  The left common femoral profunda femoris and proximal one third of the superficial femoral arteries demonstrate diffuse disease but they are widely  patent and there are no hemodynamically significant stenoses.  Beginning in the midportion of the superficial femoral there are tandem lesions of greater than 90%.  At [facility] canal the superficial femoral artery occludes in the entire popliteal artery is occluded.  The trifurcation is occluded the proximal 3 to 4 cm of the anterior tibial artery is occluded.  Beyond this the anterior tibial artery is reconstituted but there are 2 greater than 80% lesions extending over approximately 10 cm.  Distal to these lesions the anterior tibial is patent and fills the dorsalis pedis and the pedal arch.  Thrombectomy is performed with penumbra and the SFA, popliteal and anterior tibial are now recanalized with flow.  Following angioplasty of the anterior tibial now is in-line flow and looks quite nice with less than 10% residual stenosis. Angioplasty of the SFA at [place] canal and the popliteal there is greater than 50% residual stenosis as described above and therefore the life stent is deployed and postdilated to 5 mm maximally.  This yields an excellent result with less than 10% residual stenosis.  Summary: Successful recanalization left lower extremity for limb salvage                           Disposition: Patient was taken to the recovery room in stable condition having tolerated the procedure well.  Belenda Cruise Cherry Wittwer 01/23/2021,4:54 PM

## 2021-01-23 NOTE — OR Nursing (Signed)
Pt checked again immediately prior to being loaded in van, bandage is clean dry and intact.  Husband has been educated about site care and encouraged to call 911 if he feels uncomfortable with site presentation of bleeding or swelling.

## 2021-01-23 NOTE — Interval H&P Note (Signed)
History and Physical Interval Note:  01/23/2021 2:41 PM  [name]  has presented today for surgery, with the diagnosis of LLE Angiography   Ischemic LT Leg   Shelfish allergy   BARD Rep cc: [name]  The various methods of treatment have been discussed with the patient and family. After consideration of risks, benefits and other options for treatment, the patient has consented to  Procedure(s): LOWER EXTREMITY ANGIOGRAPHY (Left) as a surgical intervention.  The patient's history has been reviewed, patient examined, no change in status, stable for surgery.  I have reviewed the patient's chart and labs.  Questions were answered to the patient's satisfaction.     [name]

## 2021-01-24 ENCOUNTER — Emergency Department
Admission: EM | Admit: 2021-01-24 | Discharge: 2021-01-24 | Disposition: A | Discharge status: Home / Self Care | Payer: Medicare HMO | Attending: Emergency Medicine | Admitting: Emergency Medicine

## 2021-01-24 ENCOUNTER — Ambulatory Visit: Payer: Medicare HMO

## 2021-01-24 DIAGNOSIS — E1140 Type 2 diabetes mellitus with diabetic neuropathy, unspecified: Secondary | ICD-10-CM | POA: Diagnosis not present

## 2021-01-24 DIAGNOSIS — Z7902 Long term (current) use of antithrombotics/antiplatelets: Secondary | ICD-10-CM | POA: Insufficient documentation

## 2021-01-24 DIAGNOSIS — T82838A Hemorrhage of vascular prosthetic devices, implants and grafts, initial encounter: Secondary | ICD-10-CM | POA: Insufficient documentation

## 2021-01-24 DIAGNOSIS — I5033 Acute on chronic diastolic (congestive) heart failure: Secondary | ICD-10-CM | POA: Diagnosis not present

## 2021-01-24 DIAGNOSIS — Z7982 Long term (current) use of aspirin: Secondary | ICD-10-CM | POA: Diagnosis not present

## 2021-01-24 DIAGNOSIS — J449 Chronic obstructive pulmonary disease, unspecified: Secondary | ICD-10-CM | POA: Insufficient documentation

## 2021-01-24 DIAGNOSIS — R58 Hemorrhage, not elsewhere classified: Secondary | ICD-10-CM

## 2021-01-24 DIAGNOSIS — Z79899 Other long term (current) drug therapy: Secondary | ICD-10-CM | POA: Insufficient documentation

## 2021-01-24 DIAGNOSIS — Y840 Cardiac catheterization as the cause of abnormal reaction of the patient, or of later complication, without mention of misadventure at the time of the procedure: Secondary | ICD-10-CM | POA: Diagnosis not present

## 2021-01-24 DIAGNOSIS — Z794 Long term (current) use of insulin: Secondary | ICD-10-CM | POA: Insufficient documentation

## 2021-01-24 DIAGNOSIS — I110 Hypertensive heart disease with heart failure: Secondary | ICD-10-CM | POA: Insufficient documentation

## 2021-01-24 NOTE — ED Notes (Signed)
[facility]  CALLED  FOR  TRANSPORT  HOME  INFORMED  JESSICA  RN

## 2021-01-24 NOTE — ED Triage Notes (Signed)
Pt comes ems from home for post op issues. Had DVT removed here yesterday and husband says it won't stop oozing. Ems states that husband was messing with bandages. Bandaged has medium amount of blood and clotting present. Pt bedbound.

## 2021-01-24 NOTE — ED Provider Notes (Signed)
[facility] Emergency Department Provider Note  Time seen: 7:14 AM  I have reviewed the triage vital signs and the nursing notes.   HISTORY  Chief Complaint Post-op Problem   HPI [name] is a 80 y.o. female with a past medical history of CHF, diabetes, gastric reflux, hypertension, hyperlipidemia, prior CVA, bedbound status, presents to the emergency department for bleeding from a right femoral vein access.  According to report and record review patient underwent mechanical thrombectomy of the left lower extremity yesterday 01/23/2021.  EMS was called by the patient's husband this morning because he noted blood to the patient's right groin where they accessed her femoral vein.  On my evaluation there is a small amount of oozing from the site.  Patient is on aspirin/Plavix.  Here patient is awake alert she is calm cooperative and in no distress.   Past Medical History:  Diagnosis Date   Acute heart failure (HCC)    Aortic stenosis    CHF (congestive heart failure) (HCC)    Complication of anesthesia    Hard to wake patient up after having anesthesia   Diabetes mellitus without complication ([place])    Diabetic neuropathy ([place])    Takes Lyrica   Edema of both legs    Takes Lasix   GERD (gastroesophageal reflux disease)    High cholesterol    HTN (hypertension)    Hypertension    PAD (peripheral artery disease) (HCC)    Shortness of breath dyspnea    Spasm of back muscles    Stroke (HCC)    Wound, open    Right great toe    Patient Active Problem List   Diagnosis Date Noted   COPD (chronic obstructive pulmonary disease) ([place]) 10/20/2020   GERD (gastroesophageal reflux disease) 10/20/2020   Physical deconditioning 10/20/2020   Cellulitis 10/19/2020   Necrotic toes (HCC)    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)    Diabetic neuropathy (HCC)    Gangrene of toe of both feet ([place]) 06/06/2019   PAD (peripheral artery disease) ([place])  04/27/2019   ESBL (extended spectrum beta-lactamase) producing bacteria infection 12/25/2018   PVD (peripheral vascular disease) ([place]) 09/12/2016   Ulcer of right lower extremity ([place]) 08/29/2016   Acute on chronic diastolic heart failure ([place]) 07/08/2016   HTN (hypertension) 07/05/2016   Sensorineural hearing loss (SNHL), 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   Chronic diastolic CHF (congestive heart failure) ([place]) 03/10/2016   HLD (hyperlipidemia) 03/09/2016   Acute respiratory acidosis 03/05/2016   Anemia, iron deficiency 03/03/2016   Cerebrovascular accident (CVA) due to thrombosis of precerebral artery ([place]) 03/01/2016   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 ([place]) 03/01/2016   Atherosclerosis of native arteries of the extremities with gangrene ([place]) 12/08/2015   Anemia, chronic disease 04/27/2014   Unspecified hereditary and idiopathic peripheral neuropathy 03/14/2014   DM2 (diabetes mellitus, type 2) ([place]) 02/14/2014   Aortic stenosis 02/04/2014   Closed left subtrochanteric femur fracture ([place]) 02/03/2014   Hip fracture, left ([place]) 02/03/2014   Fibula fracture 02/03/2014   MTP instability 02/03/2014   Hip fracture ([place]) 02/03/2014    Past Surgical History:  Procedure Laterality Date   AMPUTATION TOE Left 06/09/2019   Procedure: Left third toe and partial great toe amputation and debridement;  Surgeon: Katha Cabal, MD;  Location: [facility];  Service: Vascular;  Laterality: Left;   AMPUTATION TOE Left 04/06/2020   Procedure: AMPUTATION LEFT SECOND TOE;  Surgeon: Samara Deist, DPM;  Location:  [facility];  Service: Podiatry;  Laterality: Left;   BYPASS GRAFT POPLITEAL TO TIBIAL Right 02/28/2016   Procedure: BYPASS GRAFT RIGHT BELOW KNEE POPLITEAL TO PERONEAL USING REVERSED RIGHT GREATER SAPHENOUS VEIN;  Surgeon: Conrad [place], MD;  Location: [place];  Service: Vascular;  Laterality: Right;   CYST EXCISION     Abdomen   EYE SURGERY Bilateral    Cataract removal    INTRAMEDULLARY (IM) NAIL INTERTROCHANTERIC Left 02/04/2014   Procedure: INTRAMEDULLARY (IM) NAIL INTERTROCHANTRIC FEMORAL;  Surgeon: Mauri Pole, MD;  Location: [place];  Service: Orthopedics;  Laterality: Left;   IR GENERIC HISTORICAL  03/01/2016   IR ANGIO INTRA EXTRACRAN SEL COM CAROTID INNOMINATE UNI R MOD SED 03/01/2016 [name], MD [facility]   IR GENERIC HISTORICAL  03/01/2016   IR ENDOVASC INTRACRANIAL INF OTHER THAN THROMBO ART INC DIAG ANGIO 03/01/2016 [name], MD [facility]   IR GENERIC HISTORICAL  03/01/2016   IR INTRAVSC STENT CERV CAROTID W/O EMB-PROT MOD SED INC ANGIO 03/01/2016 [name], MD [facility]   IR GENERIC HISTORICAL  06/03/2016   IR RADIOLOGIST EVAL & MGMT 06/03/2016 [facility]   LOWER EXTREMITY ANGIOGRAPHY Left 02/09/2019   Procedure: LOWER EXTREMITY ANGIOGRAPHY;  Surgeon: Katha Cabal, MD;  Location: [place] [facility];  Service: Cardiovascular;  Laterality: Left;   LOWER EXTREMITY ANGIOGRAPHY Left 03/10/2019   Procedure: LOWER EXTREMITY ANGIOGRAPHY;  Surgeon: Katha Cabal, MD;  Location: [place] [facility];  Service: Cardiovascular;  Laterality: Left;   LOWER EXTREMITY ANGIOGRAPHY Left 04/27/2019   Procedure: LOWER EXTREMITY ANGIOGRAPHY;  Surgeon: Katha Cabal, MD;  Location: [place] [facility];  Service: Cardiovascular;  Laterality: Left;   LOWER EXTREMITY ANGIOGRAPHY Left 06/08/2019   Procedure: Lower Extremity Angiography;  Surgeon: Katha Cabal, MD;  Location: [place] [facility];  Service: Cardiovascular;  Laterality: Left;   LOWER EXTREMITY ANGIOGRAPHY Left 04/03/2020   Procedure: Lower Extremity Angiography;  Surgeon: Algernon Huxley, MD;  Location: [place] [facility];  Service: Cardiovascular;  Laterality: Left;   LOWER EXTREMITY ANGIOGRAPHY Right 10/20/2020   Procedure: Lower Extremity Angiography;  Surgeon: Katha Cabal, MD;  Location: [place] [facility];  Service: Cardiovascular;  Laterality:  Right;   ORIF TOE FRACTURE Right 02/08/2014   Procedure: OPEN REDUCTION INTERNAL FIXATION Right METATARSAL  FRACTURE ;  Surgeon: Wylene Simmer, MD;  Location: [place];  Service: Orthopedics;  Laterality: Right;   PERIPHERAL VASCULAR CATHETERIZATION N/A 12/28/2015   Procedure: Abdominal Aortogram w/Lower Extremity;  Surgeon: Conrad [place], MD;  Location: [place] [facility];  Service: Cardiovascular;  Laterality: N/A;   RADIOLOGY WITH ANESTHESIA N/A 03/01/2016   Procedure: RADIOLOGY WITH ANESTHESIA;  Surgeon: [name], MD;  Location: [place];  Service: Radiology;  Laterality: N/A;   VEIN HARVEST Right 02/28/2016   Procedure: RIGHT GREATER SAPHENOUS VEIN HARVEST;  Surgeon: Conrad Jenks, MD;  Location: [place];  Service: Vascular;  Laterality: Right;    Prior to Admission medications   Medication Sig Start Date End Date Taking? Authorizing Provider  acetaminophen (TYLENOL) 500 MG tablet Take 1,000 mg by mouth every 6 (six) hours as needed for mild pain.    [provider]  amLODipine (NORVASC) 10 MG tablet Take 10 mg by mouth daily.    [provider]  aspirin EC 81 MG EC tablet Take 1 tablet (81 mg total) by mouth daily. 04/29/19   Stegmayer, Joelene Millin A, PA-C  atorvastatin (LIPITOR) 20 MG tablet  Take 1 tablet (20 mg total) by mouth daily. Patient taking differently: Take 20 mg by mouth at bedtime. 03/13/16   Virgina Jock A, PA-C  budesonide (PULMICORT) 0.25 MG/2ML nebulizer solution Take 2 mLs (0.25 mg total) by nebulization 2 (two) times daily. 06/10/18   Dustin Flock, MD  carvedilol (COREG) 6.25 MG tablet Take 6.25 mg by mouth 2 (two) times daily with a meal.    [provider]  cetirizine (ZYRTEC) 10 MG tablet Take 10 mg by mouth daily.    [provider]  cholecalciferol (VITAMIN D3) 25 MCG (1000 UNIT) tablet Take 1,000 Units by mouth daily.    [provider]  ciclopirox (PENLAC) 8 % solution Apply 1 application topically daily. 12/05/20   [provider]  clopidogrel (PLAVIX) 75 MG tablet Take 1 tablet (75 mg total) by mouth daily with breakfast. 03/04/17   Conrad Souris, MD  diclofenac Sodium (VOLTAREN) 1 % GEL Apply 1 application topically 4 (four) times daily as needed (pain).    [provider]  docusate sodium (COLACE) 100 MG capsule Take 100 mg by mouth daily.    [provider]  fluticasone (FLONASE) 50 MCG/ACT nasal spray Place 1 spray into both nostrils daily as needed for allergies.     [provider]  gabapentin (NEURONTIN) 300 MG capsule Take 300 mg by mouth at bedtime.    [provider]  hydrALAZINE (APRESOLINE) 25 MG tablet Take 1 tablet (25 mg total) by mouth every 8 (eight) hours. Patient taking differently: Take 25 mg by mouth in the morning and at bedtime. 12/28/18   Fritzi Mandes, MD  insulin glargine (LANTUS) 100 UNIT/ML injection Inject 0.15 mLs (15 Units total) into the skin at bedtime. 12/28/18   Fritzi Mandes, MD  magnesium oxide (MAG-OX) 400 MG tablet Take 400 mg by mouth 2 (two) times daily.     [provider]  modafinil (PROVIGIL) 200 MG tablet Take 200 mg by mouth daily.    [provider]  omeprazole (PRILOSEC) 20 MG capsule Take 1 capsule (20 mg total) by mouth daily. 04/17/18   Henreitta Leber, MD  polyethylene glycol (MIRALAX / GLYCOLAX) packet Take 17 g by mouth 2 (two) times daily. Patient not taking: Reported on 01/23/2021 02/09/14   Thurnell Lose, MD  potassium chloride SA (K-DUR,KLOR-CON) 20 MEQ tablet Take 20 mEq by mouth daily.    [provider]  torsemide (DEMADEX) 20 MG tablet Take 1 tablet (20 mg total) by mouth 2 (two) times daily. 12/28/18   Fritzi Mandes, MD  Vitamin E 180 MG CAPS Take 180 mg by mouth daily.    [provider]    Allergies  Allergen Reactions   Iodine Anaphylaxis   Penicillins Anaphylaxis    Tolerates ceftriaxone, cefazolin Did it involve swelling of the face/tongue/throat, SOB, or low BP?  Unknown Did it involve sudden or severe rash/hives, skin peeling, or any reaction on the inside of your mouth or nose? Unknown Did you need to seek medical attention at a hospital or doctor's office? Unknown When did it last happen?      Unknown If all above answers are "NO", may proceed with cephalosporin use.    Shellfish Allergy Anaphylaxis   Sulfa Antibiotics Anaphylaxis   Sulfacetamide Sodium Anaphylaxis   Sulfasalazine Anaphylaxis   Eggs Or Egg-Derived Products     Other reaction(s): SHORTNESS OF BREATH   Morphine And Related Other (See Comments)    Altered mental status  Family History  Problem Relation Age of Onset   Diabetes Other    Liver disease Mother    CVA Father    Diabetes Father    Hypertension Father     Social History Social History   Tobacco Use   Smoking status: Never   Smokeless tobacco: Never   Tobacco comments:    Never smoked  Substance Use Topics   Alcohol use: No    Alcohol/week: 0.0 standard drinks   Drug use: No    Review of Systems Constitutional: Negative for fever. Cardiovascular: Negative for chest pain. Respiratory: Negative for shortness of breath. Gastrointestinal: Negative for abdominal pain, vomiting  Musculoskeletal: Negative for musculoskeletal complaints Skin: Small femoral vein access site to the right groin with minimal oozing. Neurological: Negative for headache All other ROS negative  ____________________________________________   PHYSICAL EXAM:  VITAL SIGNS: ED Triage Vitals  Enc Vitals Group     BP 01/24/21 0711 (!) 144/24     Pulse Rate 01/24/21 0711 78     Resp 01/24/21 0711 18     Temp 01/24/21 0711 98.6 F (37 C)     Temp Source 01/24/21 0711 Oral     SpO2 01/24/21 0711 99 %     Weight 01/24/21 0707 138 lb 14.2 oz (63 kg)     Height 01/24/21 0707 5\' 8"  (1.727 m)     Head Circumference --      Peak Flow --      Pain Score --      Pain Loc --      Pain Edu? --      Excl. in [place]? --     Constitutional: Awake alert, calm and cooperative.  No distress. Eyes: Normal exam ENT      Head: Normocephalic and atraumatic.      Mouth/Throat: Mucous membranes are moist. Cardiovascular: Normal rate, regular rhythm.  Respiratory: Normal respiratory effort without tachypnea nor retractions. Breath sounds are clear  Gastrointestinal: Soft and nontender. No distention.   Musculoskeletal: Patient has a small incision/access area to the right groin/right femoral vein with minimal oozing present. Neurologic:  Normal speech and language. No gross focal neurologic deficits  Skin: Small incision/access site in her right groin/right femoral vein with minimal oozing. Psychiatric: Mood and affect are normal.  ____________________________________________    INITIAL IMPRESSION / ASSESSMENT AND PLAN / ED COURSE  Pertinent labs & imaging results that were available during my care of the patient were reviewed by me and considered in my medical decision making (see chart for details).   Patient has an access site to her right groin/right femoral vein with minimal oozing present.  I remove the old bandage applied Dermabond.  We will monitor to ensure hemostasis.  Patient has no other complaints at this time.  Patient remains hemostatic.  Husband is here.  We will discharge once EMS is able to take her home.  ANY MCNEICE was evaluated in Emergency Department on 01/24/2021 for the symptoms described in the history of present illness. She was evaluated in the context of the global COVID-19 pandemic, which necessitated consideration that the patient might be at risk for infection with the SARS-CoV-2 virus that causes COVID-19. Institutional protocols and algorithms that pertain to the evaluation of patients at risk for COVID-19 are in a state of rapid change based on information released by regulatory bodies including the CDC and federal and state organizations. These policies and algorithms were followed  during the patient's care in the ED.  ____________________________________________   FINAL CLINICAL IMPRESSION(S) / ED DIAGNOSES  Postoperative bleeding   Harvest Dark, MD 01/24/21 [phone]

## 2021-01-24 NOTE — ED Notes (Addendum)
Pt has very small amount of bleeding on her bandages from her surgery. Kpad removed, cleaned, and dermabonded site. No bleeding currently. Pt in NAD. Given warm blankets and husband at bedside. Pt also changed-given new brief and chux.

## 2021-01-24 NOTE — Discharge Instructions (Addendum)
Please keep bandage in place for the next 3 days.  Please call your doctor or return to the emergency department if bleeding returns.

## 2021-01-24 NOTE — ED Notes (Signed)
Pt remains with husband at bedside waiting for transport. Given cup of water.

## 2021-01-25 ENCOUNTER — Telehealth (INDEPENDENT_AMBULATORY_CARE_PROVIDER_SITE_OTHER): Payer: Self-pay

## 2021-01-25 NOTE — Telephone Encounter (Signed)
Pts husband called and left a VM on the nurses line wanting to know since the pt had a procedure Tuesday is she ok to start going to PT on Monday. Please advise.

## 2021-01-25 NOTE — Telephone Encounter (Signed)
Let's wait until she has her follow up visit

## 2021-01-26 NOTE — Telephone Encounter (Signed)
I called the pt's husband and made him aware of the NP's instructions.

## 2021-01-29 ENCOUNTER — Ambulatory Visit: Payer: Medicare HMO

## 2021-01-29 ENCOUNTER — Telehealth (INDEPENDENT_AMBULATORY_CARE_PROVIDER_SITE_OTHER): Payer: Self-pay

## 2021-01-29 NOTE — Telephone Encounter (Signed)
Pt's husband called and wanted to know when can he remove the bandage from the pt's recent procedure and is she going to have a follow up. Please advise.

## 2021-01-31 ENCOUNTER — Ambulatory Visit: Payer: Medicare HMO

## 2021-01-31 NOTE — Telephone Encounter (Signed)
Patient scheduled for 7/13 at 2pm.

## 2021-01-31 NOTE — Telephone Encounter (Signed)
I called the pt and made her aware of the Np's instructions . Please call and schedule for ABIs and FU with DR. Schnier  with in the next week  or so.

## 2021-01-31 NOTE — Telephone Encounter (Signed)
She can remove the bandage...lets get her in with ABIs in the next week or so

## 2021-02-07 ENCOUNTER — Ambulatory Visit: Payer: Medicare HMO

## 2021-02-12 ENCOUNTER — Ambulatory Visit: Payer: Medicare HMO

## 2021-02-13 ENCOUNTER — Other Ambulatory Visit (INDEPENDENT_AMBULATORY_CARE_PROVIDER_SITE_OTHER): Payer: Self-pay | Admitting: Vascular Surgery

## 2021-02-13 DIAGNOSIS — I70222 Atherosclerosis of native arteries of extremities with rest pain, left leg: Secondary | ICD-10-CM

## 2021-02-13 DIAGNOSIS — Z95820 Peripheral vascular angioplasty status with implants and grafts: Secondary | ICD-10-CM

## 2021-02-14 ENCOUNTER — Ambulatory Visit (INDEPENDENT_AMBULATORY_CARE_PROVIDER_SITE_OTHER): Payer: Medicare HMO | Admitting: Nurse Practitioner

## 2021-02-14 ENCOUNTER — Other Ambulatory Visit: Payer: Self-pay

## 2021-02-14 ENCOUNTER — Ambulatory Visit: Payer: Medicare HMO

## 2021-02-14 ENCOUNTER — Ambulatory Visit (INDEPENDENT_AMBULATORY_CARE_PROVIDER_SITE_OTHER): Payer: Medicare HMO

## 2021-02-14 VITALS — BP 134/72 | HR 86 | Resp 15

## 2021-02-14 DIAGNOSIS — I739 Peripheral vascular disease, unspecified: Secondary | ICD-10-CM | POA: Diagnosis not present

## 2021-02-14 DIAGNOSIS — Z95820 Peripheral vascular angioplasty status with implants and grafts: Secondary | ICD-10-CM

## 2021-02-14 DIAGNOSIS — I10 Essential (primary) hypertension: Secondary | ICD-10-CM | POA: Diagnosis not present

## 2021-02-14 DIAGNOSIS — E782 Mixed hyperlipidemia: Secondary | ICD-10-CM

## 2021-02-14 DIAGNOSIS — I70222 Atherosclerosis of native arteries of extremities with rest pain, left leg: Secondary | ICD-10-CM | POA: Diagnosis not present

## 2021-02-18 ENCOUNTER — Encounter (INDEPENDENT_AMBULATORY_CARE_PROVIDER_SITE_OTHER): Payer: Self-pay | Admitting: Nurse Practitioner

## 2021-02-18 NOTE — Progress Notes (Signed)
Subjective:    Patient ID: [name], female    DOB: [birth year], 80 y.o.   MRN: [id number] Chief Complaint  Patient presents with   Follow-up    [facility] 2 wk le angio    The patient returns to the office for followup and review status post angiogram with intervention. The patient notes improvement in the lower extremity symptoms. No interval shortening of the patient's claudication distance or rest pain symptoms. Previous wounds have now healed.  No new ulcers or wounds have occurred since the last visit.  The patient's husband provides much of the history.  Patient she has appointment historian.  He notes that the pain she is having the left lower extremity is gone whereas the the right she has no complaints of.  There have been no significant changes to the patient's overall health care.  The patient denies amaurosis fugax or recent TIA symptoms. There are no recent neurological changes noted. The patient denies history of DVT, PE or superficial thrombophlebitis. The patient denies recent episodes of angina or shortness of breath.   ABI's Rt=0.63 and Lt=0.96  (previous ABI's Rt=0.75 and Lt=0.00) Duplex US of the bilateral tibial arteries reveals monophasic waveforms however the left has good toe waveforms whereas the toe waveforms on the right lower extremity are flat.  The flat right lower extremity toe waveforms are consistent with previous studies.   Review of Systems  Cardiovascular:  Positive for leg swelling.  Neurological:  Positive for weakness.  Psychiatric/Behavioral:  Positive for confusion and decreased concentration.   All other systems reviewed and are negative.     Objective:   Physical Exam Vitals reviewed.  HENT:     Head: Normocephalic.  Cardiovascular:     Rate and Rhythm: Normal rate.     Pulses:          Dorsalis pedis pulses are 0 on the right side and 1+ on the left side.       Posterior tibial pulses are 0 on the right side and 1+ on the left side.   Pulmonary:     Effort: Pulmonary effort is normal.  Neurological:     Mental Status: She is alert and oriented to person, place, and time.     Motor: Weakness present.     Gait: Gait abnormal.  Psychiatric:        Mood and Affect: Mood normal.        Behavior: Behavior normal.        Thought Content: Thought content normal.        Cognition and Memory: Cognition is impaired. Memory is impaired.        Judgment: Judgment is impulsive.    BP 134/72 (BP Location: Right Arm)   Pulse 86   Resp 15   Past Medical History:  Diagnosis Date   Acute heart failure (HCC)    Aortic stenosis    CHF (congestive heart failure) (HCC)    Complication of anesthesia    Hard to wake patient up after having anesthesia   Diabetes mellitus without complication (HCC)    Diabetic neuropathy (HCC)    Takes Lyrica   Edema of both legs    Takes Lasix   GERD (gastroesophageal reflux disease)    High cholesterol    HTN (hypertension)    Hypertension    PAD (peripheral artery disease) (HCC)    Shortness of breath dyspnea    Spasm of back muscles    Stroke ([place])  Wound, open    Right great toe    Social History   Socioeconomic History   Marital status: Married    Spouse name: Not on file   Number of children: Not on file   Years of education: Not on file   Highest education level: Not on file  Occupational History   Not on file  Tobacco Use   Smoking status: Never   Smokeless tobacco: Never   Tobacco comments:    Never smoked  Substance and Sexual Activity   Alcohol use: No    Alcohol/week: 0.0 standard drinks   Drug use: No   Sexual activity: Never  Other Topics Concern   Not on file  Social History Narrative   Not on file   Social Determinants of Health   Financial Resource Strain: Not on file  Food Insecurity: Not on file  Transportation Needs: Not on file  Physical Activity: Not on file  Stress: Not on file  Social Connections: Not on file  Intimate Partner Violence:  Not on file    Past Surgical History:  Procedure Laterality Date   AMPUTATION TOE Left 06/09/2019   Procedure: Left third toe and partial great toe amputation and debridement;  Surgeon: Katha Cabal, MD;  Location: [facility];  Service: Vascular;  Laterality: Left;   AMPUTATION TOE Left 04/06/2020   Procedure: AMPUTATION LEFT SECOND TOE;  Surgeon: Samara Deist, DPM;  Location: [facility];  Service: Podiatry;  Laterality: Left;   BYPASS GRAFT POPLITEAL TO TIBIAL Right 02/28/2016   Procedure: BYPASS GRAFT RIGHT BELOW KNEE POPLITEAL TO PERONEAL USING REVERSED RIGHT GREATER SAPHENOUS VEIN;  Surgeon: Conrad Homeland, MD;  Location: [place];  Service: Vascular;  Laterality: Right;   CYST EXCISION     Abdomen   EYE SURGERY Bilateral    Cataract removal   INTRAMEDULLARY (IM) NAIL INTERTROCHANTERIC Left 02/04/2014   Procedure: INTRAMEDULLARY (IM) NAIL INTERTROCHANTRIC FEMORAL;  Surgeon: Mauri Pole, MD;  Location: [place];  Service: Orthopedics;  Laterality: Left;   IR GENERIC HISTORICAL  03/01/2016   IR ANGIO INTRA EXTRACRAN SEL COM CAROTID INNOMINATE UNI R MOD SED 03/01/2016 [name], MD [facility]   IR GENERIC HISTORICAL  03/01/2016   IR ENDOVASC INTRACRANIAL INF OTHER THAN THROMBO ART INC DIAG ANGIO 03/01/2016 [name], MD [facility]   IR GENERIC HISTORICAL  03/01/2016   IR INTRAVSC STENT CERV CAROTID W/O EMB-PROT MOD SED INC ANGIO 03/01/2016 [name], MD [facility]   IR GENERIC HISTORICAL  06/03/2016   IR RADIOLOGIST EVAL & MGMT 06/03/2016 [facility]   LOWER EXTREMITY ANGIOGRAPHY Left 02/09/2019   Procedure: LOWER EXTREMITY ANGIOGRAPHY;  Surgeon: Katha Cabal, MD;  Location: [place] [facility];  Service: Cardiovascular;  Laterality: Left;   LOWER EXTREMITY ANGIOGRAPHY Left 03/10/2019   Procedure: LOWER EXTREMITY ANGIOGRAPHY;  Surgeon: Katha Cabal, MD;  Location: [place] [facility];  Service: Cardiovascular;  Laterality: Left;   LOWER EXTREMITY  ANGIOGRAPHY Left 04/27/2019   Procedure: LOWER EXTREMITY ANGIOGRAPHY;  Surgeon: Katha Cabal, MD;  Location: [place] [facility];  Service: Cardiovascular;  Laterality: Left;   LOWER EXTREMITY ANGIOGRAPHY Left 06/08/2019   Procedure: Lower Extremity Angiography;  Surgeon: Katha Cabal, MD;  Location: [place] [facility];  Service: Cardiovascular;  Laterality: Left;   LOWER EXTREMITY ANGIOGRAPHY Left 04/03/2020   Procedure: Lower Extremity Angiography;  Surgeon: Algernon Huxley, MD;  Location: [place] [facility];  Service: Cardiovascular;  Laterality: Left;  LOWER EXTREMITY ANGIOGRAPHY Right 10/20/2020   Procedure: Lower Extremity Angiography;  Surgeon: Katha Cabal, MD;  Location: [place] [facility];  Service: Cardiovascular;  Laterality: Right;   LOWER EXTREMITY ANGIOGRAPHY Left 01/23/2021   Procedure: LOWER EXTREMITY ANGIOGRAPHY;  Surgeon: Katha Cabal, MD;  Location: [place] [facility];  Service: Cardiovascular;  Laterality: Left;   ORIF TOE FRACTURE Right 02/08/2014   Procedure: OPEN REDUCTION INTERNAL FIXATION Right METATARSAL  FRACTURE ;  Surgeon: Wylene Simmer, MD;  Location: [place];  Service: Orthopedics;  Laterality: Right;   PERIPHERAL VASCULAR CATHETERIZATION N/A 12/28/2015   Procedure: Abdominal Aortogram w/Lower Extremity;  Surgeon: Conrad Stanley, MD;  Location: [place] [facility];  Service: Cardiovascular;  Laterality: N/A;   RADIOLOGY WITH ANESTHESIA N/A 03/01/2016   Procedure: RADIOLOGY WITH ANESTHESIA;  Surgeon: [name], MD;  Location: [place];  Service: Radiology;  Laterality: N/A;   VEIN HARVEST Right 02/28/2016   Procedure: RIGHT GREATER SAPHENOUS VEIN HARVEST;  Surgeon: Conrad Liberty, MD;  Location: [place];  Service: Vascular;  Laterality: Right;    Family History  Problem Relation Age of Onset   Diabetes Other    Liver disease Mother    CVA Father    Diabetes Father    Hypertension Father     Allergies  Allergen Reactions   Iodine Anaphylaxis    Penicillins Anaphylaxis    Tolerates ceftriaxone, cefazolin Did it involve swelling of the face/tongue/throat, SOB, or low BP? Unknown Did it involve sudden or severe rash/hives, skin peeling, or any reaction on the inside of your mouth or nose? Unknown Did you need to seek medical attention at a hospital or doctor's office? Unknown When did it last happen?      Unknown If all above answers are "NO", may proceed with cephalosporin use.    Shellfish Allergy Anaphylaxis   Sulfa Antibiotics Anaphylaxis   Sulfacetamide Sodium Anaphylaxis   Sulfasalazine Anaphylaxis   Eggs Or Egg-Derived Products     Other reaction(s): SHORTNESS OF BREATH   Morphine And Related Other (See Comments)    Altered mental status    CBC Latest Ref Rng & Units 10/23/2020 10/22/2020 10/21/2020  WBC 4.0 - 10.5 K/uL 10.3 10.6(H) 13.3(H)  Hemoglobin 12.0 - 15.0 g/dL 10.5(L) 9.7(L) 11.6(L)  Hematocrit 36.0 - 46.0 % 32.8(L) 30.6(L) 36.7  Platelets 150 - 400 K/uL 212 209 232      CMP     Component Value Date/Time   NA 138 10/23/2020 0400   K 3.8 10/23/2020 0400   CL 109 10/23/2020 0400   CO2 20 (L) 10/23/2020 0400   GLUCOSE 163 (H) 10/23/2020 0400   BUN 15 01/23/2021 1402   CREATININE 0.61 01/23/2021 1402   CALCIUM 8.9 10/23/2020 0400   PROT 6.0 (L) 10/19/2020 1204   ALBUMIN 3.3 (L) 10/19/2020 1204   AST 30 10/19/2020 1204   ALT 36 10/19/2020 1204   ALKPHOS 57 10/19/2020 1204   BILITOT 0.8 10/19/2020 1204   GFRNONAA >60 01/23/2021 1402   GFRAA >60 04/06/2020 0535     VAS [place] ABI WITH/WO TBI  Result Date: 01/26/2021  LOWER EXTREMITY DOPPLER STUDY Patient Name:  SHELLIE ROGOFF  Date of Exam:   01/19/2021 Medical Rec #: [id number]      Accession #:    [id number] Date of Birth: 11/28/1940       Patient Gender: F Patient Age:   82Y Exam Location:  [facility] Procedure:      VAS  [place] ABI WITH/WO TBI Referring Phys: [id number] JASON S DEW  --------------------------------------------------------------------------------  Indications: Gangrene, and peripheral artery disease.  Vascular Interventions: 06/08/2019: PTA Left SFA and Popliteal Artery. PTA Left                         ATA.                          04/03/2020: Aortogram and Selective Left Lower Extremity                         Angiogram. Mechanical Thrombectomy of the Left SFA and                         Popliteal Artery. PTA of the Left ATA. PTA of the Left                         SFA and Popliteal Artery.                          10/20/2020: PTA and Stent placement of the Right SFA and                         Popliteal Artery. PTA of the Right ATTA. Comparison Study: 11/24/2020 Performing Technologist: Almira Coaster RVS  Examination Guidelines: A complete evaluation includes at minimum, Doppler waveform signals and systolic blood pressure reading at the level of bilateral brachial, anterior tibial, and posterior tibial arteries, when vessel segments are accessible. Bilateral testing is considered an integral part of a complete examination. Photoelectric Plethysmograph (PPG) waveforms and toe systolic pressure readings are included as required and additional duplex testing as needed. Limited examinations for reoccurring indications may be performed as noted.  ABI Findings: +---------+------------------+-----+----------+--------+ Right    Rt Pressure (mmHg)IndexWaveform  Comment  +---------+------------------+-----+----------+--------+ Brachial 135                                       +---------+------------------+-----+----------+--------+ ATA      104               0.75 monophasic         +---------+------------------+-----+----------+--------+ PTA      0                 0.00 absent             +---------+------------------+-----+----------+--------+ Great Toe0                 0.00 Absent             +---------+------------------+-----+----------+--------+  +---------+------------------+-----+--------+-------+ Left     Lt Pressure (mmHg)IndexWaveformComment +---------+------------------+-----+--------+-------+ Brachial 138                                    +---------+------------------+-----+--------+-------+ ATA      0                 0.00 absent          +---------+------------------+-----+--------+-------+ PTA      0                 0.00 absent          +---------+------------------+-----+--------+-------+  Great Toe0                 0.00 Absent          +---------+------------------+-----+--------+-------+ +-------+-----------+-----------+------------+------------+ ABI/TBIToday's ABIToday's TBIPrevious ABIPrevious TBI +-------+-----------+-----------+------------+------------+ Right  .75        0          .88         0            +-------+-----------+-----------+------------+------------+ Left   0          0          .50         0            +-------+-----------+-----------+------------+------------+ Bilateral TBIs appear essentially unchanged compared to prior study on 11/24/2020. Bilateral ABIs appear decreased compared to prior study on 11/24/2020.  Summary: Right: Resting right ankle-brachial index indicates moderate right lower extremity arterial disease. The right toe-brachial index is abnormal. Left: Resting left ankle-brachial index indicates critical left limb ischemia. The left toe-brachial index is abnormal.  *See table(s) above for measurements and observations.  Electronically signed by Leotis Pain MD on 01/26/2021 at 11:46:15 AM.    Final        Assessment & Plan:   1. PAD (peripheral artery disease) ([place]) I had a long discussion with the patient and her husband in regards to the noninvasive studies today.  While the right lower extremity does not have pain the flow to the foot is greatly reduced.  We discussed the possible risk this poses without intervention and the patient and husband still do not  wish to undergo intervention.  Therefore we will continue with close follow-up sooner or return noninvasive studies in 6 weeks.  2. Primary hypertension Continue antihypertensive medications as already ordered, these medications have been reviewed and there are no changes at this time.   3. Mixed hyperlipidemia Continue statin as ordered and reviewed, no changes at this time    Current Outpatient Medications on File Prior to Visit  Medication Sig Dispense Refill   acetaminophen (TYLENOL) 500 MG tablet Take 1,000 mg by mouth every 6 (six) hours as needed for mild pain.     amLODipine (NORVASC) 10 MG tablet Take 10 mg by mouth daily.     aspirin EC 81 MG EC tablet Take 1 tablet (81 mg total) by mouth daily.     atorvastatin (LIPITOR) 20 MG tablet Take 1 tablet (20 mg total) by mouth daily. (Patient taking differently: Take 20 mg by mouth at bedtime.) 30 tablet 11   budesonide (PULMICORT) 0.25 MG/2ML nebulizer solution Take 2 mLs (0.25 mg total) by nebulization 2 (two) times daily. 60 mL 12   carvedilol (COREG) 6.25 MG tablet Take 6.25 mg by mouth 2 (two) times daily with a meal.     cetirizine (ZYRTEC) 10 MG tablet Take 10 mg by mouth daily.     cholecalciferol (VITAMIN D3) 25 MCG (1000 UNIT) tablet Take 1,000 Units by mouth daily.     ciclopirox (PENLAC) 8 % solution Apply 1 application topically daily.     clopidogrel (PLAVIX) 75 MG tablet Take 1 tablet (75 mg total) by mouth daily with breakfast. 30 tablet 0   diclofenac Sodium (VOLTAREN) 1 % GEL Apply 1 application topically 4 (four) times daily as needed (pain).     docusate sodium (COLACE) 100 MG capsule Take 100 mg by mouth daily.     fluticasone (FLONASE) 50 MCG/ACT nasal spray Place 1 spray into  both nostrils daily as needed for allergies.      gabapentin (NEURONTIN) 300 MG capsule Take 300 mg by mouth at bedtime.     hydrALAZINE (APRESOLINE) 25 MG tablet Take 1 tablet (25 mg total) by mouth every 8 (eight) hours. (Patient taking  differently: Take 25 mg by mouth in the morning and at bedtime.) 60 tablet 0   insulin glargine (LANTUS) 100 UNIT/ML injection Inject 0.15 mLs (15 Units total) into the skin at bedtime. 1 vial 0   magnesium oxide (MAG-OX) 400 MG tablet Take 400 mg by mouth 2 (two) times daily.      modafinil (PROVIGIL) 200 MG tablet Take 200 mg by mouth daily.     omeprazole (PRILOSEC) 20 MG capsule Take 1 capsule (20 mg total) by mouth daily.     potassium chloride SA (K-DUR,KLOR-CON) 20 MEQ tablet Take 20 mEq by mouth daily.     torsemide (DEMADEX) 20 MG tablet Take 1 tablet (20 mg total) by mouth 2 (two) times daily.     Vitamin E 180 MG CAPS Take 180 mg by mouth daily.     polyethylene glycol (MIRALAX / GLYCOLAX) packet Take 17 g by mouth 2 (two) times daily. (Patient not taking: No sig reported) 14 each 0   No current facility-administered medications on file prior to visit.    There are no Patient Instructions on file for this visit. No follow-ups on file.   Kris Hartmann, NP

## 2021-02-19 ENCOUNTER — Other Ambulatory Visit: Payer: Self-pay

## 2021-02-19 ENCOUNTER — Ambulatory Visit: Payer: Medicare HMO | Attending: Family Medicine

## 2021-02-19 DIAGNOSIS — R2689 Other abnormalities of gait and mobility: Secondary | ICD-10-CM | POA: Diagnosis present

## 2021-02-19 DIAGNOSIS — R293 Abnormal posture: Secondary | ICD-10-CM | POA: Insufficient documentation

## 2021-02-19 DIAGNOSIS — M6281 Muscle weakness (generalized): Secondary | ICD-10-CM | POA: Insufficient documentation

## 2021-02-19 NOTE — Therapy (Signed)
[place] MAIN [facility] [address] [place], [place], [address] Phone: [phone]   Fax:  [phone]  Physical Therapy Treatment  Patient Details  Name: [name] MRN: [id number] Date of Birth: 11/25/1940 Referring Provider (PT): [name]   Encounter Date: 02/19/2021   PT End of Session - 02/19/21 1031     Visit Number 4    Number of Visits 24    Date for PT Re-Evaluation 04/04/21    Authorization Type 4/10 eval 6/8    PT Start Time 0930    PT Stop Time 1013    PT Time Calculation (min) 43 min    Equipment Utilized During Treatment Gait belt    Activity Tolerance Patient limited by fatigue;Patient limited by pain;Patient limited by lethargy    Behavior During Therapy Flat affect             Past Medical History:  Diagnosis Date   Acute heart failure (HCC)    Aortic stenosis    CHF (congestive heart failure) (HCC)    Complication of anesthesia    Hard to wake patient up after having anesthesia   Diabetes mellitus without complication ([place])    Diabetic neuropathy ([place])    Takes Lyrica   Edema of both legs    Takes Lasix   GERD (gastroesophageal reflux disease)    High cholesterol    HTN (hypertension)    Hypertension    PAD (peripheral artery disease) (HCC)    Shortness of breath dyspnea    Spasm of back muscles    Stroke (HCC)    Wound, open    Right great toe    Past Surgical History:  Procedure Laterality Date   AMPUTATION TOE Left 06/09/2019   Procedure: Left third toe and partial great toe amputation and debridement;  Surgeon: Katha Cabal, MD;  Location: [facility];  Service: Vascular;  Laterality: Left;   AMPUTATION TOE Left 04/06/2020   Procedure: AMPUTATION LEFT SECOND TOE;  Surgeon: Samara Deist, DPM;  Location: [facility];  Service: Podiatry;  Laterality: Left;   BYPASS GRAFT POPLITEAL TO TIBIAL Right 02/28/2016   Procedure: BYPASS GRAFT RIGHT BELOW KNEE POPLITEAL TO PERONEAL USING  REVERSED RIGHT GREATER SAPHENOUS VEIN;  Surgeon: Conrad Wolbach, MD;  Location: [place];  Service: Vascular;  Laterality: Right;   CYST EXCISION     Abdomen   EYE SURGERY Bilateral    Cataract removal   INTRAMEDULLARY (IM) NAIL INTERTROCHANTERIC Left 02/04/2014   Procedure: INTRAMEDULLARY (IM) NAIL INTERTROCHANTRIC FEMORAL;  Surgeon: Mauri Pole, MD;  Location: [place];  Service: Orthopedics;  Laterality: Left;   IR GENERIC HISTORICAL  03/01/2016   IR ANGIO INTRA EXTRACRAN SEL COM CAROTID INNOMINATE UNI R MOD SED 03/01/2016 [name], MD [facility]   IR GENERIC HISTORICAL  03/01/2016   IR ENDOVASC INTRACRANIAL INF OTHER THAN THROMBO ART INC DIAG ANGIO 03/01/2016 [name], MD [facility]   IR GENERIC HISTORICAL  03/01/2016   IR INTRAVSC STENT CERV CAROTID W/O EMB-PROT MOD SED INC ANGIO 03/01/2016 [name], MD [facility]   IR GENERIC HISTORICAL  06/03/2016   IR RADIOLOGIST EVAL & MGMT 06/03/2016 [facility]   LOWER EXTREMITY ANGIOGRAPHY Left 02/09/2019   Procedure: LOWER EXTREMITY ANGIOGRAPHY;  Surgeon: Katha Cabal, MD;  Location: [place] [facility];  Service: Cardiovascular;  Laterality: Left;   LOWER EXTREMITY ANGIOGRAPHY Left 03/10/2019   Procedure: LOWER EXTREMITY ANGIOGRAPHY;  Surgeon: Katha Cabal, MD;  Location: [place] [facility];  Service: Cardiovascular;  Laterality: Left;   LOWER EXTREMITY ANGIOGRAPHY Left 04/27/2019   Procedure: LOWER EXTREMITY ANGIOGRAPHY;  Surgeon: Katha Cabal, MD;  Location: [place] [facility];  Service: Cardiovascular;  Laterality: Left;   LOWER EXTREMITY ANGIOGRAPHY Left 06/08/2019   Procedure: Lower Extremity Angiography;  Surgeon: Katha Cabal, MD;  Location: [place] [facility];  Service: Cardiovascular;  Laterality: Left;   LOWER EXTREMITY ANGIOGRAPHY Left 04/03/2020   Procedure: Lower Extremity Angiography;  Surgeon: Algernon Huxley, MD;  Location: [place] [facility];  Service: Cardiovascular;  Laterality:  Left;   LOWER EXTREMITY ANGIOGRAPHY Right 10/20/2020   Procedure: Lower Extremity Angiography;  Surgeon: Katha Cabal, MD;  Location: [place] [facility];  Service: Cardiovascular;  Laterality: Right;   LOWER EXTREMITY ANGIOGRAPHY Left 01/23/2021   Procedure: LOWER EXTREMITY ANGIOGRAPHY;  Surgeon: Katha Cabal, MD;  Location: [place] [facility];  Service: Cardiovascular;  Laterality: Left;   ORIF TOE FRACTURE Right 02/08/2014   Procedure: OPEN REDUCTION INTERNAL FIXATION Right METATARSAL  FRACTURE ;  Surgeon: Wylene Simmer, MD;  Location: [place];  Service: Orthopedics;  Laterality: Right;   PERIPHERAL VASCULAR CATHETERIZATION N/A 12/28/2015   Procedure: Abdominal Aortogram w/Lower Extremity;  Surgeon: Conrad Gentry, MD;  Location: [place] [facility];  Service: Cardiovascular;  Laterality: N/A;   RADIOLOGY WITH ANESTHESIA N/A 03/01/2016   Procedure: RADIOLOGY WITH ANESTHESIA;  Surgeon: [name], MD;  Location: [place];  Service: Radiology;  Laterality: N/A;   VEIN HARVEST Right 02/28/2016   Procedure: RIGHT GREATER SAPHENOUS VEIN HARVEST;  Surgeon: Conrad , MD;  Location: [place];  Service: Vascular;  Laterality: Right;    There were no vitals filed for this visit.   Subjective Assessment - 02/19/21 1028     Subjective Patient returning to therapy after a months absence. Patient had a vascular procedure and had complications resulting in need to hold from therapy for a month. Husband reports over the past four days patient has been very sleepy. Her blood pressure was normal    Patient is accompained by: Family member    Pertinent History [facility] outpatient for core strengthening and working toward ambulation.   Patient has deconditioning, amputation of 2nd toe, pressure ulcer of sacral region stage II, amputation of third toe, hypercapnic respiratory failure, peripheral neuropathy. PMH includes acute HF, CHF, DM, neuropathy, GERD, HTN, PAD, stroke, COPD, ESBL, CVA. Patient has a  caregiver 4 hours a day for three times a week. Patient is dependent for all mobility, transfers, toileting and ADL needs. Patient has not ambulated in three years and wishes to ambulate. At home patient utilizes hoyer lift to wheelchair but is not able to propel herself.   No toileting equipment at pt's disposal besides disposable briefs that pt can use due to deconditioned state she is in now. Husband cleans wife daily after soiling briefs for BM and urination. 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    Limitations Lifting;Standing;Walking;House hold activities;Sitting    How long can you sit comfortably? requires back rest; limited by pain in buttocks    How long can you stand comfortably? Unable    How long can you walk comfortably? Unable    Patient Stated Goals To transfer    Currently in Pain? Yes    Pain Score --   whole body patient does not give number   Pain Orientation --   whole body  Pain Onset More than a month ago                   Treatment: Seated in chair due to patient fatigue and c/o of stomach pain LAQ 10x each LE; x 2 sets  Ball squeeze adduction 10x 3 second holds Abduction isometric against PT hand single limb at a time 10x 3 second holds Hip flexion AAROM 10x each LE Hamstring isometric into PT hand single limb at a time 10x 3 second holds PF/DF AAROM; patient unable to perform actively Right trunk lean 10x very challenging for patient; x2 sets  PVC pipe trunk flexion/extension 10x; requires rest break after 5x AAROM alternating row with PT hand assistance 10x each UE AAROM UE flexion 10x each UE with PT hand held assistance 10x Rainbow ball flexion 10x Rainbow ball chest pass 10x; 2 sets  Rainbow ball cross body twist 10x Bilateral arm abduction 10x Trunk positioning in front of mirror x2 minutes; x 2 sets   Patient requires maximal cueing for task performance and encouragement for task  performance.          Patient hasn't been seen in over a month. Patient had a procedure and the wound wouldn't heal, she had to go back to the emergency room to seal the wound again per patient husband report. Last few days she has been sleepy all the time.  Patient's session is significantly limited by patient falling asleep mid set requiring tactile cueing to wake back up. Patient is challenged with task orientation this session requiring constant re-orientation. Patient husband aware of change of cognition and is meeting with the doctor tomorrow. Patient will benefit from skilled physical therapy to improve tranfers, strength, and range of motion to reduce caregiver burden.         PT Education - 02/19/21 1030     Education Details exercise technique, body mechanics    Person(s) Educated Patient    Methods Explanation;Demonstration;Tactile cues;Verbal cues    Comprehension Verbalized understanding;Returned demonstration;Verbal cues required;Tactile cues required              PT Short Term Goals - 01/10/21 1121       PT SHORT TERM GOAL #1   Title Patient will be independent in home exercise program to improve strength/mobility for better functional independence with ADLs.    Baseline 6/8: HEP given    Time 6    Period Weeks    Status New    Target Date 02/21/21               PT Long Term Goals - 01/10/21 1122       PT LONG TERM GOAL #1   Title Patient will increase FOTO score to equal to or greater than   31%  to demonstrate statistically significant improvement in mobility and quality of life.    Baseline 6/8: 12%    Time 12    Period Weeks    Status New    Target Date 04/04/21      PT LONG TERM GOAL #2   Title Patient will roll L and R in bed with mod A for decreased caregiver burden for ADLs and toileting.    Baseline 6/8: dependent    Time 12    Period Weeks    Status New    Target Date 04/04/21      PT LONG TERM GOAL #3   Title Patient will  sit EOB with min A for 10 minutes for  ADL performance and pressure relief.    Baseline 6/8: unable    Time 12    Period Weeks    Status New    Target Date 04/04/21      PT LONG TERM GOAL #4   Title Patient will assist in transfer from bed to chair with mod assistance for decreased caregiver burden.    Baseline 6/8: dependent    Time 12    Period Weeks    Status New    Target Date 04/04/21                   Plan - 02/19/21 1035     Clinical Impression Statement Patient hasn't been seen in over a month. Patient had a procedure and the wound wouldn't heal, she had to go back to the emergency room to seal the wound again per patient husband report. Last few days she has been sleepy all the time.  Patient's session is significantly limited by patient falling asleep mid set requiring tactile cueing to wake back up. Patient is challenged with task orientation this session requiring constant re-orientation. Patient husband aware of change of cognition and is meeting with the doctor tomorrow. Patient will benefit from skilled physical therapy to improve tranfers, strength, and range of motion to reduce caregiver burden.    Personal Factors and Comorbidities Age;Time since onset of injury/illness/exacerbation;Comorbidity 3+;Past/Current Experience;Education;Social Background;Transportation    Comorbidities diastolic congestive heart failure, diabetes mellitus, diabetic neuropathy, hypertension, hyperlipidemia, GERD, peripheral vascular disease, CVA. on 9/2 had amputation of L foot second toe MTP joint and LLE revascularization.    Examination-Activity Limitations Bathing;Hygiene/Grooming;Squat;Bed Mobility;Stairs;Lift;Bend;Locomotion Level;Stand;Caring for [employer];Toileting;Carry;Self Feeding;Transfers;Continence;Dressing;Other    Examination-Participation Restrictions Cleaning;Shop;Community Activity;Meal Prep;Driving;Other    Stability/Clinical Decision Making  Unstable/Unpredictable    Rehab Potential Poor    PT Frequency 2x / week    PT Duration 12 weeks    PT Treatment/Interventions ADLs/Self Care Home Management;DME Instruction;Functional mobility training;Therapeutic activities;Therapeutic exercise;Neuromuscular re-education;Patient/family education;Wheelchair mobility training;Passive range of motion;Energy conservation;Cryotherapy;Biofeedback;Electrical Stimulation;Iontophoresis 4mg /ml Dexamethasone;Moist Heat;Traction;Ultrasound;Gait training;Stair training;Cognitive remediation;Balance training;Orthotic Fit/Training;Manual techniques;Manual lymph drainage;Splinting;Taping;Vasopneumatic Device;Vestibular;Visual/perceptual remediation/compensation    PT Next Visit Plan hoyer lift to chair    PT Home Exercise Plan see above    Consulted and Agree with Plan of Care Patient;Family member/caregiver    Family Member Consulted Spouse             Patient will benefit from skilled therapeutic intervention in order to improve the following deficits and impairments:  Decreased cognition, Decreased skin integrity, Impaired sensation, Decreased mobility, Postural dysfunction, Decreased activity tolerance, Decreased endurance, Decreased range of motion, Decreased strength, Impaired perceived functional ability, Decreased balance, Decreased safety awareness, Increased edema, Impaired flexibility, Obesity, Decreased coordination, Cardiopulmonary status limiting activity, Abnormal gait, Impaired UE functional use, Impaired tone, Improper body mechanics, Pain, Difficulty walking  Visit Diagnosis: Muscle weakness (generalized)  Other abnormalities of gait and mobility  Abnormal posture     Problem List Patient Active Problem List   Diagnosis Date Noted   COPD (chronic obstructive pulmonary disease) ([place]) 10/20/2020   GERD (gastroesophageal reflux disease) 10/20/2020   Physical deconditioning 10/20/2020   Cellulitis 10/19/2020   Necrotic toes (HCC)     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)    Diabetic neuropathy (HCC)    Gangrene of toe of both feet ([place]) 06/06/2019   PAD (peripheral artery disease) ([place]) 04/27/2019   ESBL (extended spectrum beta-lactamase) producing bacteria infection 12/25/2018   PVD (peripheral vascular disease) ([place]) 09/12/2016   Ulcer  of right lower extremity ([place]) 08/29/2016   Acute on chronic diastolic heart failure ([place]) 07/08/2016   HTN (hypertension) 07/05/2016   Sensorineural hearing loss (SNHL), 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   Chronic diastolic CHF (congestive heart failure) ([place]) 03/10/2016   HLD (hyperlipidemia) 03/09/2016   Acute respiratory acidosis 03/05/2016   Anemia, iron deficiency 03/03/2016   Cerebrovascular accident (CVA) due to thrombosis of precerebral artery ([place]) 03/01/2016   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 ([place]) 03/01/2016   Atherosclerosis of native arteries of the extremities with gangrene ([place]) 12/08/2015   Anemia, chronic disease 04/27/2014   Unspecified hereditary and idiopathic peripheral neuropathy 03/14/2014   DM2 (diabetes mellitus, type 2) ([place]) 02/14/2014   Aortic stenosis 02/04/2014   Closed left subtrochanteric femur fracture ([place]) 02/03/2014   Hip fracture, left ([place]) 02/03/2014   Fibula fracture 02/03/2014   MTP instability 02/03/2014   Hip fracture ([place]) 02/03/2014    Janna Arch, PT, DPT  02/19/2021, 10:36 AM  [place], [place], [address] Phone: [phone]   Fax:  [phone]  Name: [name] MRN: [id number] Date of Birth: July 25, 1941

## 2021-02-21 ENCOUNTER — Ambulatory Visit: Payer: Medicare HMO

## 2021-02-21 ENCOUNTER — Other Ambulatory Visit: Payer: Self-pay

## 2021-02-21 DIAGNOSIS — R293 Abnormal posture: Secondary | ICD-10-CM

## 2021-02-21 DIAGNOSIS — R2689 Other abnormalities of gait and mobility: Secondary | ICD-10-CM

## 2021-02-21 DIAGNOSIS — M6281 Muscle weakness (generalized): Secondary | ICD-10-CM | POA: Diagnosis not present

## 2021-02-21 NOTE — Therapy (Signed)
[place] MAIN [facility] SERVICES [address] [place], [place], [address] Phone: [phone]   Fax:  [phone]  Physical Therapy Treatment  Patient Details  Name: [name] MRN: [id number] Date of Birth: [birth year] Referring Provider (PT): [name]   Encounter Date: 02/21/2021   PT End of Session - 02/21/21 1025     Visit Number 5    Number of Visits 24    Date for PT Re-Evaluation 04/04/21    Authorization Type 5/10 eval 6/8    PT Start Time 0930    PT Stop Time 1014    PT Time Calculation (min) 44 min    Equipment Utilized During Treatment Gait belt    Activity Tolerance Patient limited by fatigue;Patient limited by pain;Patient limited by lethargy    Behavior During Therapy Flat affect             Past Medical History:  Diagnosis Date   Acute heart failure (HCC)    Aortic stenosis    CHF (congestive heart failure) (HCC)    Complication of anesthesia    Hard to wake patient up after having anesthesia   Diabetes mellitus without complication ([place])    Diabetic neuropathy ([place])    Takes Lyrica   Edema of both legs    Takes Lasix   GERD (gastroesophageal reflux disease)    High cholesterol    HTN (hypertension)    Hypertension    PAD (peripheral artery disease) (HCC)    Shortness of breath dyspnea    Spasm of back muscles    Stroke (HCC)    Wound, open    Right great toe    Past Surgical History:  Procedure Laterality Date   AMPUTATION TOE Left 06/09/2019   Procedure: Left third toe and partial great toe amputation and debridement;  Surgeon: Katha Cabal, MD;  Location: [facility];  Service: Vascular;  Laterality: Left;   AMPUTATION TOE Left 04/06/2020   Procedure: AMPUTATION LEFT SECOND TOE;  Surgeon: Samara Deist, DPM;  Location: [facility];  Service: Podiatry;  Laterality: Left;   BYPASS GRAFT POPLITEAL TO TIBIAL Right 02/28/2016   Procedure: BYPASS GRAFT RIGHT BELOW KNEE POPLITEAL TO PERONEAL USING  REVERSED RIGHT GREATER SAPHENOUS VEIN;  Surgeon: Conrad Elgin, MD;  Location: [place];  Service: Vascular;  Laterality: Right;   CYST EXCISION     Abdomen   EYE SURGERY Bilateral    Cataract removal   INTRAMEDULLARY (IM) NAIL INTERTROCHANTERIC Left 02/04/2014   Procedure: INTRAMEDULLARY (IM) NAIL INTERTROCHANTRIC FEMORAL;  Surgeon: Mauri Pole, MD;  Location: [place];  Service: Orthopedics;  Laterality: Left;   IR GENERIC HISTORICAL  03/01/2016   IR ANGIO INTRA EXTRACRAN SEL COM CAROTID INNOMINATE UNI R MOD SED 03/01/2016 [name], MD [facility]   IR GENERIC HISTORICAL  03/01/2016   IR ENDOVASC INTRACRANIAL INF OTHER THAN THROMBO ART INC DIAG ANGIO 03/01/2016 [name], MD [facility]   IR GENERIC HISTORICAL  03/01/2016   IR INTRAVSC STENT CERV CAROTID W/O EMB-PROT MOD SED INC ANGIO 03/01/2016 [name], MD [facility]   IR GENERIC HISTORICAL  06/03/2016   IR RADIOLOGIST EVAL & MGMT 06/03/2016 [facility]   LOWER EXTREMITY ANGIOGRAPHY Left 02/09/2019   Procedure: LOWER EXTREMITY ANGIOGRAPHY;  Surgeon: Katha Cabal, MD;  Location: [place] [facility];  Service: Cardiovascular;  Laterality: Left;   LOWER EXTREMITY ANGIOGRAPHY Left 03/10/2019   Procedure: LOWER EXTREMITY ANGIOGRAPHY;  Surgeon: Katha Cabal, MD;  Location: [place] [facility];  Service: Cardiovascular;  Laterality: Left;   LOWER EXTREMITY ANGIOGRAPHY Left 04/27/2019   Procedure: LOWER EXTREMITY ANGIOGRAPHY;  Surgeon: Katha Cabal, MD;  Location: [place] [facility];  Service: Cardiovascular;  Laterality: Left;   LOWER EXTREMITY ANGIOGRAPHY Left 06/08/2019   Procedure: Lower Extremity Angiography;  Surgeon: Katha Cabal, MD;  Location: [place] [facility];  Service: Cardiovascular;  Laterality: Left;   LOWER EXTREMITY ANGIOGRAPHY Left 04/03/2020   Procedure: Lower Extremity Angiography;  Surgeon: Algernon Huxley, MD;  Location: [place] [facility];  Service: Cardiovascular;  Laterality:  Left;   LOWER EXTREMITY ANGIOGRAPHY Right 10/20/2020   Procedure: Lower Extremity Angiography;  Surgeon: Katha Cabal, MD;  Location: [place] [facility];  Service: Cardiovascular;  Laterality: Right;   LOWER EXTREMITY ANGIOGRAPHY Left 01/23/2021   Procedure: LOWER EXTREMITY ANGIOGRAPHY;  Surgeon: Katha Cabal, MD;  Location: [place] [facility];  Service: Cardiovascular;  Laterality: Left;   ORIF TOE FRACTURE Right 02/08/2014   Procedure: OPEN REDUCTION INTERNAL FIXATION Right METATARSAL  FRACTURE ;  Surgeon: Wylene Simmer, MD;  Location: [place];  Service: Orthopedics;  Laterality: Right;   PERIPHERAL VASCULAR CATHETERIZATION N/A 12/28/2015   Procedure: Abdominal Aortogram w/Lower Extremity;  Surgeon: Conrad Treutlen, MD;  Location: [place] [facility];  Service: Cardiovascular;  Laterality: N/A;   RADIOLOGY WITH ANESTHESIA N/A 03/01/2016   Procedure: RADIOLOGY WITH ANESTHESIA;  Surgeon: [name], MD;  Location: [place];  Service: Radiology;  Laterality: N/A;   VEIN HARVEST Right 02/28/2016   Procedure: RIGHT GREATER SAPHENOUS VEIN HARVEST;  Surgeon: Conrad Au Gres, MD;  Location: [place];  Service: Vascular;  Laterality: Right;    There were no vitals filed for this visit.   Subjective Assessment - 02/21/21 1015     Subjective Patient presents with husband, reports he had a call with the PA yesterday in regards to her sleepiness. No answers yet for if it is medication related. Patient is very sleepy this session.    Patient is accompained by: Family member    Pertinent History [place] outpatient for core strengthening and working toward ambulation.   Patient has deconditioning, amputation of 2nd toe, pressure ulcer of sacral region stage II, amputation of third toe, hypercapnic respiratory failure, peripheral neuropathy. PMH includes acute HF, CHF, DM, neuropathy, GERD, HTN, PAD, stroke, COPD, ESBL, CVA. Patient has a caregiver 4 hours a day for three times a week. Patient is dependent for all  mobility, transfers, toileting and ADL needs. Patient has not ambulated in three years and wishes to ambulate. At home patient utilizes hoyer lift to wheelchair but is not able to propel herself.   No toileting equipment at pt's disposal besides disposable briefs that pt can use due to deconditioned state she is in now. Husband cleans wife daily after soiling briefs for BM and urination. 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    Limitations Lifting;Standing;Walking;House hold activities;Sitting    How long can you sit comfortably? requires back rest; limited by pain in buttocks    How long can you stand comfortably? Unable    How long can you walk comfortably? Unable    Patient Stated Goals To transfer    Currently in Pain? No/denies                 Treatment: Seated in chair due to patient fatigue and c/o of stomach pain LAQ 10x each LE; x  2 sets with 1.5 ankle weight  Straight leg abduction/adduction max cueing with 1.5# ankle weight 10x each LE,  Ball squeeze adduction 10x 3 second holds Abduction  against RTB 10x 3 second holds Hip flexion 10x each LE with #1.5 ankle weight Hamstring isometric into RTB single limb at a time 10x 3 second holds PF/DF AAROM; patient unable to perform actively Right trunk lean 10x very challenging for patient; x2 sets  PVC pipe trunk flexion/extension 10x; requires rest break after 5x PVC pipe overhead raise 10x PVC pipe chest press 10x RTB bicep curl 12x single arm at a time  AAROM UE flexion 10x each UE with PT hand held assistance 10x Rainbow ball chest pass 10x; 2 sets  Rainbow ball cross body twist 10x Bilateral arm abduction 10x Trunk positioning in front of mirror x2 minutes;    Patient requires maximal cueing for task performance and encouragement for task performance. Ankle weight added to seated tasks for LE strengthening. Husband educated that patient must be able to participate and  interact with therapy in order to continue therapy as this session was severely limited by patient cognition. Patient will benefit from skilled physical therapy to improve tranfers, strength, and range of motion to reduce caregiver burden.                    Patient is noncompliant this session requiring max cueing for sequencing and task orientation. She frequently becomes agitated and irritated with PT requiring re-orientation to task.           PT Education - 02/21/21 1025     Education Details need for patient to be able to interact with therapist    Person(s) Educated Patient    Methods Explanation;Demonstration;Tactile cues;Verbal cues    Comprehension Verbalized understanding;Returned demonstration;Verbal cues required;Tactile cues required              PT Short Term Goals - 01/10/21 1121       PT SHORT TERM GOAL #1   Title Patient will be independent in home exercise program to improve strength/mobility for better functional independence with ADLs.    Baseline 6/8: HEP given    Time 6    Period Weeks    Status New    Target Date 02/21/21               PT Long Term Goals - 01/10/21 1122       PT LONG TERM GOAL #1   Title Patient will increase FOTO score to equal to or greater than   31%  to demonstrate statistically significant improvement in mobility and quality of life.    Baseline 6/8: 12%    Time 12    Period Weeks    Status New    Target Date 04/04/21      PT LONG TERM GOAL #2   Title Patient will roll L and R in bed with mod A for decreased caregiver burden for ADLs and toileting.    Baseline 6/8: dependent    Time 12    Period Weeks    Status New    Target Date 04/04/21      PT LONG TERM GOAL #3   Title Patient will sit EOB with min A for 10 minutes for ADL performance and pressure relief.    Baseline 6/8: unable    Time 12    Period Weeks    Status New    Target Date 04/04/21  PT LONG TERM GOAL #4   Title Patient  will assist in transfer from bed to chair with mod assistance for decreased caregiver burden.    Baseline 6/8: dependent    Time 12    Period Weeks    Status New    Target Date 04/04/21                   Plan - 02/21/21 1026     Clinical Impression Statement Patient requires maximal cueing for task performance and encouragement for task performance. Ankle weight added to seated tasks for LE strengthening. Husband educated that patient must be able to participate and interact with therapy in order to continue therapy as this session was severely limited by patient cognition. Patient will benefit from skilled physical therapy to improve tranfers, strength, and range of motion to reduce caregiver burden.    Personal Factors and Comorbidities Age;Time since onset of injury/illness/exacerbation;Comorbidity 3+;Past/Current Experience;Education;Social Background;Transportation    Comorbidities diastolic congestive heart failure, diabetes mellitus, diabetic neuropathy, hypertension, hyperlipidemia, GERD, peripheral vascular disease, CVA. on 9/2 had amputation of L foot second toe MTP joint and LLE revascularization.    Examination-Activity Limitations Bathing;Hygiene/Grooming;Squat;Bed Mobility;Stairs;Lift;Bend;Locomotion Level;Stand;Caring for [employer];Toileting;Carry;Self Feeding;Transfers;Continence;Dressing;Other    Examination-Participation Restrictions Cleaning;Shop;Community Activity;Meal Prep;Driving;Other    Stability/Clinical Decision Making Unstable/Unpredictable    Rehab Potential Poor    PT Frequency 2x / week    PT Duration 12 weeks    PT Treatment/Interventions ADLs/Self Care Home Management;DME Instruction;Functional mobility training;Therapeutic activities;Therapeutic exercise;Neuromuscular re-education;Patient/family education;Wheelchair mobility training;Passive range of motion;Energy conservation;Cryotherapy;Biofeedback;Electrical Stimulation;Iontophoresis 4mg /ml  Dexamethasone;Moist Heat;Traction;Ultrasound;Gait training;Stair training;Cognitive remediation;Balance training;Orthotic Fit/Training;Manual techniques;Manual lymph drainage;Splinting;Taping;Vasopneumatic Device;Vestibular;Visual/perceptual remediation/compensation    PT Next Visit Plan hoyer lift to chair    PT Home Exercise Plan see above    Consulted and Agree with Plan of Care Patient;Family member/caregiver    Family Member Consulted Spouse             Patient will benefit from skilled therapeutic intervention in order to improve the following deficits and impairments:  Decreased cognition, Decreased skin integrity, Impaired sensation, Decreased mobility, Postural dysfunction, Decreased activity tolerance, Decreased endurance, Decreased range of motion, Decreased strength, Impaired perceived functional ability, Decreased balance, Decreased safety awareness, Increased edema, Impaired flexibility, Obesity, Decreased coordination, Cardiopulmonary status limiting activity, Abnormal gait, Impaired UE functional use, Impaired tone, Improper body mechanics, Pain, Difficulty walking  Visit Diagnosis: Muscle weakness (generalized)  Other abnormalities of gait and mobility  Abnormal posture     Problem List Patient Active Problem List   Diagnosis Date Noted   COPD (chronic obstructive pulmonary disease) (HCC) 10/20/2020   GERD (gastroesophageal reflux disease) 10/20/2020   Physical deconditioning 10/20/2020   Cellulitis 10/19/2020   Necrotic toes (HCC)    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)    Diabetic neuropathy (HCC)    Gangrene of toe of both feet ([place]) 06/06/2019   PAD (peripheral artery disease) ([place]) 04/27/2019   ESBL (extended spectrum beta-lactamase) producing bacteria infection 12/25/2018   PVD (peripheral vascular disease) ([place]) 09/12/2016   Ulcer of right lower extremity ([place]) 08/29/2016   Acute on chronic diastolic heart failure ([place]) 07/08/2016   HTN  (hypertension) 07/05/2016   Sensorineural hearing loss (SNHL), 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   Chronic diastolic CHF (congestive heart failure) ([place]) 03/10/2016   HLD (hyperlipidemia) 03/09/2016   Acute respiratory acidosis 03/05/2016   Anemia, iron deficiency 03/03/2016   Cerebrovascular accident (CVA) due to thrombosis of precerebral artery ([place]) 03/01/2016   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 ([place]) 03/01/2016   Atherosclerosis of native arteries of the extremities with gangrene ([place]) 12/08/2015   Anemia, chronic disease 04/27/2014   Unspecified hereditary and idiopathic peripheral neuropathy 03/14/2014   DM2 (diabetes mellitus, type 2) ([place]) 02/14/2014   Aortic stenosis 02/04/2014   Closed left subtrochanteric femur fracture ([place]) 02/03/2014   Hip fracture, left ([place]) 02/03/2014   Fibula fracture 02/03/2014   MTP instability 02/03/2014   Hip fracture ([place]) 02/03/2014    Janna Arch, PT, DPT  02/21/2021, 10:26 AM  [place] MAIN [facility] SERVICES [address] [place], [place], [address] Phone: [phone]   Fax:  [phone]  Name: LOURINE ALBERICO MRN: [id number] Date of Birth: Jul 19, 1941

## 2021-02-26 ENCOUNTER — Ambulatory Visit: Payer: Medicare HMO | Admitting: Physical Therapy

## 2021-02-26 ENCOUNTER — Other Ambulatory Visit: Payer: Self-pay

## 2021-02-26 DIAGNOSIS — R2689 Other abnormalities of gait and mobility: Secondary | ICD-10-CM

## 2021-02-26 DIAGNOSIS — M6281 Muscle weakness (generalized): Secondary | ICD-10-CM | POA: Diagnosis not present

## 2021-02-26 DIAGNOSIS — R293 Abnormal posture: Secondary | ICD-10-CM

## 2021-02-26 NOTE — Therapy (Signed)
[place] MAIN [facility] SERVICES [address] [place], [place], [address] Phone: [phone]   Fax:  [phone]  Physical Therapy Treatment  Patient Details  Name: [name] MRN: [id number] Date of Birth: 1940-09-13 Referring Provider (PT): [name]   Encounter Date: 02/26/2021    Past Medical History:  Diagnosis Date   Acute heart failure [facility])    Aortic stenosis    CHF (congestive heart failure) ([place])    Complication of anesthesia    Hard to wake patient up after having anesthesia   Diabetes mellitus without complication ([place])    Diabetic neuropathy ([place])    Takes Lyrica   Edema of both legs    Takes Lasix   GERD (gastroesophageal reflux disease)    High cholesterol    HTN (hypertension)    Hypertension    PAD (peripheral artery disease) ([place])    Shortness of breath dyspnea    Spasm of back muscles    Stroke ([place])    Wound, open    Right great toe    Past Surgical History:  Procedure Laterality Date   AMPUTATION TOE Left 06/09/2019   Procedure: Left third toe and partial great toe amputation and debridement;  Surgeon: Katha Cabal, MD;  Location: [facility];  Service: Vascular;  Laterality: Left;   AMPUTATION TOE Left 04/06/2020   Procedure: AMPUTATION LEFT SECOND TOE;  Surgeon: Samara Deist, DPM;  Location: [facility];  Service: Podiatry;  Laterality: Left;   BYPASS GRAFT POPLITEAL TO TIBIAL Right 02/28/2016   Procedure: BYPASS GRAFT RIGHT BELOW KNEE POPLITEAL TO PERONEAL USING REVERSED RIGHT GREATER SAPHENOUS VEIN;  Surgeon: Conrad Trinidad, MD;  Location: [place];  Service: Vascular;  Laterality: Right;   CYST EXCISION     Abdomen   EYE SURGERY Bilateral    Cataract removal   INTRAMEDULLARY (IM) NAIL INTERTROCHANTERIC Left 02/04/2014   Procedure: INTRAMEDULLARY (IM) NAIL INTERTROCHANTRIC FEMORAL;  Surgeon: Mauri Pole, MD;  Location: [place];  Service: Orthopedics;  Laterality: Left;   IR GENERIC HISTORICAL  03/01/2016    IR ANGIO INTRA EXTRACRAN SEL COM CAROTID INNOMINATE UNI R MOD SED 03/01/2016 [name], MD [facility]   IR GENERIC HISTORICAL  03/01/2016   IR ENDOVASC INTRACRANIAL INF OTHER THAN THROMBO ART INC DIAG ANGIO 03/01/2016 [name], MD [facility]   IR GENERIC HISTORICAL  03/01/2016   IR INTRAVSC STENT CERV CAROTID W/O EMB-PROT MOD SED INC ANGIO 03/01/2016 [name], MD [facility]   IR GENERIC HISTORICAL  06/03/2016   IR RADIOLOGIST EVAL & MGMT 06/03/2016 [facility]   LOWER EXTREMITY ANGIOGRAPHY Left 02/09/2019   Procedure: LOWER EXTREMITY ANGIOGRAPHY;  Surgeon: Katha Cabal, MD;  Location: [place] CV LAB;  Service: Cardiovascular;  Laterality: Left;   LOWER EXTREMITY ANGIOGRAPHY Left 03/10/2019   Procedure: LOWER EXTREMITY ANGIOGRAPHY;  Surgeon: Katha Cabal, MD;  Location: [place] [facility];  Service: Cardiovascular;  Laterality: Left;   LOWER EXTREMITY ANGIOGRAPHY Left 04/27/2019   Procedure: LOWER EXTREMITY ANGIOGRAPHY;  Surgeon: Katha Cabal, MD;  Location: [place] [facility];  Service: Cardiovascular;  Laterality: Left;   LOWER EXTREMITY ANGIOGRAPHY Left 06/08/2019   Procedure: Lower Extremity Angiography;  Surgeon: Katha Cabal, MD;  Location: [place] [facility];  Service: Cardiovascular;  Laterality: Left;   LOWER EXTREMITY ANGIOGRAPHY Left 04/03/2020   Procedure: Lower Extremity Angiography;  Surgeon: Algernon Huxley, MD;  Location: [place] [facility];  Service: Cardiovascular;  Laterality: Left;   LOWER EXTREMITY ANGIOGRAPHY Right 10/20/2020   Procedure: Lower Extremity Angiography;  Surgeon: Katha Cabal, MD;  Location: [place] [facility];  Service: Cardiovascular;  Laterality: Right;   LOWER EXTREMITY ANGIOGRAPHY Left 01/23/2021   Procedure: LOWER EXTREMITY ANGIOGRAPHY;  Surgeon: Katha Cabal, MD;  Location: [place] [facility];  Service: Cardiovascular;  Laterality: Left;   ORIF TOE FRACTURE Right 02/08/2014    Procedure: OPEN REDUCTION INTERNAL FIXATION Right METATARSAL  FRACTURE ;  Surgeon: Wylene Simmer, MD;  Location: [place];  Service: Orthopedics;  Laterality: Right;   PERIPHERAL VASCULAR CATHETERIZATION N/A 12/28/2015   Procedure: Abdominal Aortogram w/Lower Extremity;  Surgeon: Conrad [place], MD;  Location: [place] [facility];  Service: Cardiovascular;  Laterality: N/A;   RADIOLOGY WITH ANESTHESIA N/A 03/01/2016   Procedure: RADIOLOGY WITH ANESTHESIA;  Surgeon: [name], MD;  Location: [place];  Service: Radiology;  Laterality: N/A;   VEIN HARVEST Right 02/28/2016   Procedure: RIGHT GREATER SAPHENOUS VEIN HARVEST;  Surgeon: Conrad Cement, MD;  Location: [place];  Service: Vascular;  Laterality: Right;    There were no vitals filed for this visit.   Treatment: Remained seated in chair due to patient fatigue, eyes drifting shut and c/o of stomach pain: LAQ 10x each LE; x 2 sets with 2# ankle weight; Ball squeeze adduction 2x10 with 3 second holds; Hip abduction against RTB 2x10 with 3 second holds; Hip flexion marches 10x LLE only with 2# ankle weight (pt unable to follow commands for RLE); Hamstring curl using RTB, single limb at a time 10x; PF/DF AAROM 10x each; patient unable to perform actively; Active assist trunk flexion with resisted trunk extension 2x5 reps; RTB bicep curl 10x single arm at a time; RTB row (alternating single UE pulls with occasional bilateral pulls; pt difficulty following sequencing);    Pt arrives to PT, her husband was present throughout session. She was initially non-verbal, not responding to any of PT's questions; pt became mildly more responsive once in PT gym. Patient requires maximal cueing and encouragement for task performance. Ankle weight was increased to 2# to progress LE strengthening. Husband inquired about pt participation compared to previous session. PT educated husband on continued limited participation, responsiveness and continuous reports of fatigue  with her eyes drifting closed. Husband was educated on how to improve pt energy level including getting pt out of bed daily, after he reported she only get out of bed on the days she has therapy because he is not comfortable performing a hoyer lift independently. Patient will benefit from skilled physical therapy to improve tranfers, strength, and range of motion to reduce caregiver burden. She and husband may benefit from [facility].    Patient is noncompliant this session requiring max cueing (verbal, tactile, visual demonstration) for sequencing and task orientation. She frequently closed eyes and did not respond/re-open eyes when PT attempted to arouse; when pt did arouse she required re-orientation to task. Multiple breaks required to change mask (4x) due to pt discomfort and to blow her nose (x3). Pt also requested water in which she must drink through a straw; PT unsuccessfully attempted to find straw during session.          PT Short Term Goals - 01/10/21 1121       PT SHORT TERM GOAL #1   Title Patient will be independent in home exercise program to improve strength/mobility for better functional independence with ADLs.    Baseline 6/8: HEP given  Time 6    Period Weeks    Status New    Target Date 02/21/21               PT Long Term Goals - 01/10/21 1122       PT LONG TERM GOAL #1   Title Patient will increase FOTO score to equal to or greater than   31%  to demonstrate statistically significant improvement in mobility and quality of life.    Baseline 6/8: 12%    Time 12    Period Weeks    Status New    Target Date 04/04/21      PT LONG TERM GOAL #2   Title Patient will roll L and R in bed with mod A for decreased caregiver burden for ADLs and toileting.    Baseline 6/8: dependent    Time 12    Period Weeks    Status New    Target Date 04/04/21      PT LONG TERM GOAL #3   Title Patient will sit EOB with min A for 10 minutes for ADL performance  and pressure relief.    Baseline 6/8: unable    Time 12    Period Weeks    Status New    Target Date 04/04/21      PT LONG TERM GOAL #4   Title Patient will assist in transfer from bed to chair with mod assistance for decreased caregiver burden.    Baseline 6/8: dependent    Time 12    Period Weeks    Status New    Target Date 04/04/21                    Patient will benefit from skilled therapeutic intervention in order to improve the following deficits and impairments:     Visit Diagnosis: Muscle weakness (generalized)  Other abnormalities of gait and mobility  Abnormal posture     Problem List Patient Active Problem List   Diagnosis Date Noted   COPD (chronic obstructive pulmonary disease) ([place]) 10/20/2020   GERD (gastroesophageal reflux disease) 10/20/2020   Physical deconditioning 10/20/2020   Cellulitis 10/19/2020   Necrotic toes (H[facility])    Insulin dependent diabetes mellitus type IA ([place])    Diabetic neuropathy ([place])    Gangrene of toe of both feet ([place]) 06/06/2019   PAD (peripheral artery disease) ([place]) 04/27/2019   ESBL (extended spectrum beta-lactamase) producing bacteria infection 12/25/2018   PVD (peripheral vascular disease) ([place]) 09/12/2016   Ulcer of right lower extremity ([place]) 08/29/2016   Acute on chronic diastolic heart failure ([place]) 07/08/2016   HTN (hypertension) 07/05/2016   Sensorineural hearing loss (SNHL), 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   Chronic diastolic CHF (congestive heart failure) ([place]) 03/10/2016   HLD (hyperlipidemia) 03/09/2016   Acute respiratory acidosis 03/05/2016   Anemia, iron deficiency 03/03/2016   Cerebrovascular accident (CVA) due to thrombosis of precerebral artery ([place]) 03/01/2016   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 ([place]) 03/01/2016   Atherosclerosis of native arteries of the extremities with gangrene ([place]) 12/08/2015   Anemia, chronic disease 04/27/2014   Unspecified hereditary and idiopathic  peripheral neuropathy 03/14/2014   DM2 (diabetes mellitus, type 2) ([place]) 02/14/2014   Aortic stenosis 02/04/2014   Closed left subtrochanteric femur fracture ([place]) 02/03/2014   Hip fracture, left ([place]) 02/03/2014   Fibula fracture 02/03/2014   MTP instability 02/03/2014   Hip fracture ([place]) 02/03/2014   Patrina Levering PT, DPT  [facility]  02/26/2021, 11:58 AM  [place] MAIN [facility] SERVICES [address] [place], [place], [address] Phone: [phone]   Fax:  [phone]  Name: [name] MRN: [id number] Date of Birth: June 11, 1941

## 2021-02-28 ENCOUNTER — Ambulatory Visit: Payer: Medicare HMO

## 2021-02-28 ENCOUNTER — Other Ambulatory Visit: Payer: Self-pay

## 2021-02-28 DIAGNOSIS — R2689 Other abnormalities of gait and mobility: Secondary | ICD-10-CM

## 2021-02-28 DIAGNOSIS — R293 Abnormal posture: Secondary | ICD-10-CM

## 2021-02-28 DIAGNOSIS — M6281 Muscle weakness (generalized): Secondary | ICD-10-CM | POA: Diagnosis not present

## 2021-02-28 NOTE — Therapy (Signed)
[place] [facility] SERVICES [address] [place], [place], [address] Phone: [phone]   Fax:  [phone]  Physical Therapy Treatment  Patient Details  Name: [name] MRN: [id number] Date of Birth: 02/05/1941 Referring Provider (PT): [name]   Encounter Date: 02/28/2021   PT End of Session - 02/28/21 1110     Visit Number 7    Number of Visits 24    Date for PT Re-Evaluation 04/04/21    Authorization Type 7/10 eval 6/8    PT Start Time 1015    PT Stop Time 1058    PT Time Calculation (min) 43 min    Equipment Utilized During Treatment Gait belt    Activity Tolerance Patient limited by fatigue;Patient limited by pain;Patient limited by lethargy    Behavior During Therapy Flat affect             Past Medical History:  Diagnosis Date   Acute heart failure (HCC)    Aortic stenosis    CHF (congestive heart failure) (HCC)    Complication of anesthesia    Hard to wake patient up after having anesthesia   Diabetes mellitus without complication ([place])    Diabetic neuropathy ([place])    Takes Lyrica   Edema of both legs    Takes Lasix   GERD (gastroesophageal reflux disease)    High cholesterol    HTN (hypertension)    Hypertension    PAD (peripheral artery disease) (HCC)    Shortness of breath dyspnea    Spasm of back muscles    Stroke (HCC)    Wound, open    Right great toe    Past Surgical History:  Procedure Laterality Date   AMPUTATION TOE Left 06/09/2019   Procedure: Left third toe and partial great toe amputation and debridement;  Surgeon: Katha Cabal, MD;  Location: [facility];  Service: Vascular;  Laterality: Left;   AMPUTATION TOE Left 04/06/2020   Procedure: AMPUTATION LEFT SECOND TOE;  Surgeon: Samara Deist, DPM;  Location: [facility];  Service: Podiatry;  Laterality: Left;   BYPASS GRAFT POPLITEAL TO TIBIAL Right 02/28/2016   Procedure: BYPASS GRAFT RIGHT BELOW KNEE POPLITEAL TO PERONEAL USING  REVERSED RIGHT GREATER SAPHENOUS VEIN;  Surgeon: Conrad [place], MD;  Location: [place];  Service: Vascular;  Laterality: Right;   CYST EXCISION     Abdomen   EYE SURGERY Bilateral    Cataract removal   INTRAMEDULLARY (IM) NAIL INTERTROCHANTERIC Left 02/04/2014   Procedure: INTRAMEDULLARY (IM) NAIL INTERTROCHANTRIC FEMORAL;  Surgeon: Mauri Pole, MD;  Location: [place];  Service: Orthopedics;  Laterality: Left;   IR GENERIC HISTORICAL  03/01/2016   IR ANGIO INTRA EXTRACRAN SEL COM CAROTID INNOMINATE UNI R MOD SED 03/01/2016 [name], MD [facility]   IR GENERIC HISTORICAL  03/01/2016   IR ENDOVASC INTRACRANIAL INF OTHER THAN THROMBO ART INC DIAG ANGIO 03/01/2016 [name], MD [facility]   IR GENERIC HISTORICAL  03/01/2016   IR INTRAVSC STENT CERV CAROTID W/O EMB-PROT MOD SED INC ANGIO 03/01/2016 [name], MD [facility]   IR GENERIC HISTORICAL  06/03/2016   IR RADIOLOGIST EVAL & MGMT 06/03/2016 [facility]   LOWER EXTREMITY ANGIOGRAPHY Left 02/09/2019   Procedure: LOWER EXTREMITY ANGIOGRAPHY;  Surgeon: Katha Cabal, MD;  Location: [place] [facility];  Service: Cardiovascular;  Laterality: Left;   LOWER EXTREMITY ANGIOGRAPHY Left 03/10/2019   Procedure: LOWER EXTREMITY ANGIOGRAPHY;  Surgeon: Katha Cabal, MD;  Location: [place] [facility];  Service: Cardiovascular;  Laterality: Left;   LOWER EXTREMITY ANGIOGRAPHY Left 04/27/2019   Procedure: LOWER EXTREMITY ANGIOGRAPHY;  Surgeon: Katha Cabal, MD;  Location: [place] [facility];  Service: Cardiovascular;  Laterality: Left;   LOWER EXTREMITY ANGIOGRAPHY Left 06/08/2019   Procedure: Lower Extremity Angiography;  Surgeon: Katha Cabal, MD;  Location: [place] [facility];  Service: Cardiovascular;  Laterality: Left;   LOWER EXTREMITY ANGIOGRAPHY Left 04/03/2020   Procedure: Lower Extremity Angiography;  Surgeon: Algernon Huxley, MD;  Location: [place] [facility];  Service: Cardiovascular;  Laterality:  Left;   LOWER EXTREMITY ANGIOGRAPHY Right 10/20/2020   Procedure: Lower Extremity Angiography;  Surgeon: Katha Cabal, MD;  Location: [place] [facility];  Service: Cardiovascular;  Laterality: Right;   LOWER EXTREMITY ANGIOGRAPHY Left 01/23/2021   Procedure: LOWER EXTREMITY ANGIOGRAPHY;  Surgeon: Katha Cabal, MD;  Location: [place] [facility];  Service: Cardiovascular;  Laterality: Left;   ORIF TOE FRACTURE Right 02/08/2014   Procedure: OPEN REDUCTION INTERNAL FIXATION Right METATARSAL  FRACTURE ;  Surgeon: Wylene Simmer, MD;  Location: [place];  Service: Orthopedics;  Laterality: Right;   PERIPHERAL VASCULAR CATHETERIZATION N/A 12/28/2015   Procedure: Abdominal Aortogram w/Lower Extremity;  Surgeon: Conrad [place], MD;  Location: [place] [facility];  Service: Cardiovascular;  Laterality: N/A;   RADIOLOGY WITH ANESTHESIA N/A 03/01/2016   Procedure: RADIOLOGY WITH ANESTHESIA;  Surgeon: [name], MD;  Location: [place];  Service: Radiology;  Laterality: N/A;   VEIN HARVEST Right 02/28/2016   Procedure: RIGHT GREATER SAPHENOUS VEIN HARVEST;  Surgeon: Conrad [place], MD;  Location: [place];  Service: Vascular;  Laterality: Right;    There were no vitals filed for this visit.   Subjective Assessment - 02/28/21 1008     Subjective Patient presents with husband an hour early for session due to not looking at schedule ahead of time.    Patient is accompained by: Family member    Pertinent History [facility] outpatient for core strengthening and working toward ambulation.   Patient has deconditioning, amputation of 2nd toe, pressure ulcer of sacral region stage II, amputation of third toe, hypercapnic respiratory failure, peripheral neuropathy. PMH includes acute HF, CHF, DM, neuropathy, GERD, HTN, PAD, stroke, COPD, ESBL, CVA. Patient has a caregiver 4 hours a day for three times a week. Patient is dependent for all mobility, transfers, toileting and ADL needs. Patient has not ambulated in three years  and wishes to ambulate. At home patient utilizes hoyer lift to wheelchair but is not able to propel herself.   No toileting equipment at pt's disposal besides disposable briefs that pt can use due to deconditioned state she is in now. Husband cleans wife daily after soiling briefs for BM and urination. 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    Limitations Lifting;Standing;Walking;House hold activities;Sitting    How long can you sit comfortably? requires back rest; limited by pain in buttocks    How long can you stand comfortably? Unable    How long can you walk comfortably? Unable    Patient Stated Goals To transfer    Currently in Pain? No/denies               Treatment: Seated in chair due to patient fatigue and c/o of stomach pain LAQ 10x each LE; x 2 sets with 2lb ankle weight Straight leg abduction/adduction max cueing with 1.5# ankle weight 10x each  LE,  Ball squeeze adduction 10x 3 second holds Abduction  against RTB 10x 3 second holds Hip flexion 10x each LE with #2 ankle weight Hamstring isometric into RTB single limb at a time 10x 3 second holds PF/DF AAROM; patient unable to perform actively Right trunk lean 10x very challenging for patient; x2 sets  RTB bicep curl 12x single arm at a time  RTB abduction Ue's 10x each UE; very challenging AAROM UE flexion 10x each UE with PT hand held assistance 10x Rainbow ball chest pass 10x; 2 sets  Rainbow ball cross body twist 10x Small ball reach and toss at target for core stabilization against pertubation and UE strength x 12 balls Lateral reach to the R to grab cone and pass to PT 10x Trunk positioning in front of mirror x2 minutes;  Access Code: [email or web address] URL: [email or web address] Date: 02/28/2021 Prepared by: Janna Arch  Exercises Seated Long Arc Quad - 1 x daily - 7 x weekly - 2 sets - 10 reps - 5 hold Seated Bent Over Shoulder Row with Dumbbells - 1  x daily - 7 x weekly - 2 sets - 10 reps - 5 hold Seated Biceps Curl - 1 x daily - 7 x weekly - 2 sets - 10 reps - 5 hold Seated March - 1 x daily - 7 x weekly - 2 sets - 10 reps - 5 hold Seated Hip Adduction Isometrics with Ball - 1 x daily - 7 x weekly - 2 sets - 10 reps - 5 hold Seated Shoulder Horizontal Abduction with Resistance - 1 x daily - 7 x weekly - 2 sets - 10 reps - 5 hold Seated Hip Abduction with Resistance - 1 x daily - 7 x weekly - 2 sets - 10 reps - 5 hold Seated Shoulder Flexion - 1 x daily - 7 x weekly - 2 sets - 10 reps - 5 hold Seated Shoulder Horizontal Abduction with Resistance - 1 x daily - 7 x weekly - 2 sets - 10 reps - 5 hold Seated Lateral Trunk Leans with Neutral Spine - 1 x daily - 7 x weekly - 2 sets - 10 reps - 5 hold      Patient is noncompliant this session requiring max cueing (verbal, tactile, visual demonstration) for sequencing and task orientation  Conversation with patient and patient's husband in regards to hoyer lift with patient. Patient husband declines hoyer lift training despite education on needing to get patient out of bed between sessions.   Patient presents with husband to PT. She is very delayed with interactions and not responding to PT. Requires extensive cueing for muscle activation frequently with PT visually showing patient each interventions and keeping count on fingers for PT. Patient frequently stopping interventions to state "I'm tired" and required max cueing for task re-orientation. Patient prognosis is guarded due to limited ability to interact in session at this time. Conversation with patient and patient's husband in regards to hoyer lift with patient. Patient husband declines hoyer lift training despite education on needing to get patient out of bed between sessions. Patient will benefit from skilled physical therapy to improve tranfers, strength, and range of motion to reduce caregiver burden             PT Education -  02/28/21 1110     Education Details need to interaction with therapist    Person(s) Educated Patient    Methods Explanation;Demonstration;Tactile cues;Verbal cues    Comprehension Verbalized understanding;Returned demonstration;Verbal cues required;Tactile cues  required              PT Short Term Goals - 01/10/21 1121       PT SHORT TERM GOAL #1   Title Patient will be independent in home exercise program to improve strength/mobility for better functional independence with ADLs.    Baseline 6/8: HEP given    Time 6    Period Weeks    Status New    Target Date 02/21/21               PT Long Term Goals - 01/10/21 1122       PT LONG TERM GOAL #1   Title Patient will increase FOTO score to equal to or greater than   31%  to demonstrate statistically significant improvement in mobility and quality of life.    Baseline 6/8: 12%    Time 12    Period Weeks    Status New    Target Date 04/04/21      PT LONG TERM GOAL #2   Title Patient will roll L and R in bed with mod A for decreased caregiver burden for ADLs and toileting.    Baseline 6/8: dependent    Time 12    Period Weeks    Status New    Target Date 04/04/21      PT LONG TERM GOAL #3   Title Patient will sit EOB with min A for 10 minutes for ADL performance and pressure relief.    Baseline 6/8: unable    Time 12    Period Weeks    Status New    Target Date 04/04/21      PT LONG TERM GOAL #4   Title Patient will assist in transfer from bed to chair with mod assistance for decreased caregiver burden.    Baseline 6/8: dependent    Time 12    Period Weeks    Status New    Target Date 04/04/21                   Plan - 02/28/21 1112     Clinical Impression Statement Patient presents with husband to PT. She is very delayed with interactions and not responding to PT. Requires extensive cueing for muscle activation frequently with PT visually showing patient each interventions and keeping count on  fingers for PT. Patient frequently stopping interventions to state "I'm tired" and required max cueing for task re-orientation. Patient prognosis is guarded due to limited ability to interact in session at this time. Conversation with patient and patient's husband in regards to hoyer lift with patient. Patient husband declines hoyer lift training despite education on needing to get patient out of bed between sessions. Patient will benefit from skilled physical therapy to improve tranfers, strength, and range of motion to reduce caregiver burden    Personal Factors and Comorbidities Age;Time since onset of injury/illness/exacerbation;Comorbidity 3+;Past/Current Experience;Education;Social Background;Transportation    Comorbidities diastolic congestive heart failure, diabetes mellitus, diabetic neuropathy, hypertension, hyperlipidemia, GERD, peripheral vascular disease, CVA. on 9/2 had amputation of L foot second toe MTP joint and LLE revascularization.    Examination-Activity Limitations Bathing;Hygiene/Grooming;Squat;Bed Mobility;Stairs;Lift;Bend;Locomotion Level;Stand;Caring for [employer];Toileting;Carry;Self Feeding;Transfers;Continence;Dressing;Other    Examination-Participation Restrictions Cleaning;Shop;Community Activity;Meal Prep;Driving;Other    Stability/Clinical Decision Making Unstable/Unpredictable    Rehab Potential Poor    PT Frequency 2x / week    PT Duration 12 weeks    PT Treatment/Interventions ADLs/Self Care Home Management;DME Instruction;Functional mobility training;Therapeutic activities;Therapeutic exercise;Neuromuscular re-education;Patient/family education;Wheelchair mobility  training;Passive range of motion;Energy conservation;Cryotherapy;Biofeedback;Electrical Stimulation;Iontophoresis '4mg'$ /ml Dexamethasone;Moist Heat;Traction;Ultrasound;Gait training;Stair training;Cognitive remediation;Balance training;Orthotic Fit/Training;Manual techniques;Manual lymph  drainage;Splinting;Taping;Vasopneumatic Device;Vestibular;Visual/perceptual remediation/compensation    PT Next Visit Plan hoyer lift to chair    PT Home Exercise Plan see above    Consulted and Agree with Plan of Care Patient;Family member/caregiver    Family Member Consulted Spouse             Patient will benefit from skilled therapeutic intervention in order to improve the following deficits and impairments:  Decreased cognition, Decreased skin integrity, Impaired sensation, Decreased mobility, Postural dysfunction, Decreased activity tolerance, Decreased endurance, Decreased range of motion, Decreased strength, Impaired perceived functional ability, Decreased balance, Decreased safety awareness, Increased edema, Impaired flexibility, Obesity, Decreased coordination, Cardiopulmonary status limiting activity, Abnormal gait, Impaired UE functional use, Impaired tone, Improper body mechanics, Pain, Difficulty walking  Visit Diagnosis: Muscle weakness (generalized)  Other abnormalities of gait and mobility  Abnormal posture     Problem List Patient Active Problem List   Diagnosis Date Noted   COPD (chronic obstructive pulmonary disease) ([place]) 10/20/2020   GERD (gastroesophageal reflux disease) 10/20/2020   Physical deconditioning 10/20/2020   Cellulitis 10/19/2020   Necrotic toes (HCC)    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)    Diabetic neuropathy (HCC)    Gangrene of toe of both feet ([place]) 06/06/2019   PAD (peripheral artery disease) ([place]) 04/27/2019   ESBL (extended spectrum beta-lactamase) producing bacteria infection 12/25/2018   PVD (peripheral vascular disease) ([place]) 09/12/2016   Ulcer of right lower extremity ([place]) 08/29/2016   Acute on chronic diastolic heart failure ([place]) 07/08/2016   HTN (hypertension) 07/05/2016   Sensorineural hearing loss (SNHL), 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   Chronic diastolic CHF (congestive heart failure) ([place]) 03/10/2016   HLD  (hyperlipidemia) 03/09/2016   Acute respiratory acidosis 03/05/2016   Anemia, iron deficiency 03/03/2016   Cerebrovascular accident (CVA) due to thrombosis of precerebral artery ([place]) 03/01/2016   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 ([place]) 03/01/2016   Atherosclerosis of native arteries of the extremities with gangrene ([place]) 12/08/2015   Anemia, chronic disease 04/27/2014   Unspecified hereditary and idiopathic peripheral neuropathy 03/14/2014   DM2 (diabetes mellitus, type 2) ([place]) 02/14/2014   Aortic stenosis 02/04/2014   Closed left subtrochanteric femur fracture ([place]) 02/03/2014   Hip fracture, left ([place]) 02/03/2014   Fibula fracture 02/03/2014   MTP instability 02/03/2014   Hip fracture ([place]) 02/03/2014    Janna Arch, PT, DPT  02/28/2021, 11:13 AM  [place] [facility] [place], [place], [address] Phone: [phone]   Fax:  [phone]  Name: [name] MRN: [id number] Date of Birth: 09-26-1940

## 2021-03-06 ENCOUNTER — Inpatient Hospital Stay (HOSPITAL_COMMUNITY)
Admission: EM | Admit: 2021-03-06 | Discharge: 2021-03-10 | DRG: 177 | Disposition: A | Discharge status: Home Health | Payer: Medicare HMO | Attending: Internal Medicine | Admitting: Internal Medicine

## 2021-03-06 ENCOUNTER — Other Ambulatory Visit: Payer: Self-pay

## 2021-03-06 ENCOUNTER — Encounter (HOSPITAL_COMMUNITY): Payer: Self-pay | Admitting: Emergency Medicine

## 2021-03-06 ENCOUNTER — Emergency Department (HOSPITAL_COMMUNITY): Payer: Medicare HMO

## 2021-03-06 DIAGNOSIS — I5033 Acute on chronic diastolic (congestive) heart failure: Secondary | ICD-10-CM | POA: Diagnosis present

## 2021-03-06 DIAGNOSIS — Z8249 Family history of ischemic heart disease and other diseases of the circulatory system: Secondary | ICD-10-CM

## 2021-03-06 DIAGNOSIS — Z794 Long term (current) use of insulin: Secondary | ICD-10-CM

## 2021-03-06 DIAGNOSIS — Z7902 Long term (current) use of antithrombotics/antiplatelets: Secondary | ICD-10-CM

## 2021-03-06 DIAGNOSIS — E782 Mixed hyperlipidemia: Secondary | ICD-10-CM | POA: Diagnosis present

## 2021-03-06 DIAGNOSIS — Z89422 Acquired absence of other left toe(s): Secondary | ICD-10-CM

## 2021-03-06 DIAGNOSIS — Z7951 Long term (current) use of inhaled steroids: Secondary | ICD-10-CM

## 2021-03-06 DIAGNOSIS — E1140 Type 2 diabetes mellitus with diabetic neuropathy, unspecified: Secondary | ICD-10-CM | POA: Diagnosis present

## 2021-03-06 DIAGNOSIS — J440 Chronic obstructive pulmonary disease with acute lower respiratory infection: Secondary | ICD-10-CM | POA: Diagnosis present

## 2021-03-06 DIAGNOSIS — Z8673 Personal history of transient ischemic attack (TIA), and cerebral infarction without residual deficits: Secondary | ICD-10-CM

## 2021-03-06 DIAGNOSIS — I5032 Chronic diastolic (congestive) heart failure: Secondary | ICD-10-CM | POA: Diagnosis present

## 2021-03-06 DIAGNOSIS — Z7982 Long term (current) use of aspirin: Secondary | ICD-10-CM

## 2021-03-06 DIAGNOSIS — I248 Other forms of acute ischemic heart disease: Secondary | ICD-10-CM | POA: Diagnosis present

## 2021-03-06 DIAGNOSIS — U071 COVID-19: Principal | ICD-10-CM | POA: Diagnosis present

## 2021-03-06 DIAGNOSIS — Z888 Allergy status to other drugs, medicaments and biological substances status: Secondary | ICD-10-CM

## 2021-03-06 DIAGNOSIS — Z91013 Allergy to seafood: Secondary | ICD-10-CM

## 2021-03-06 DIAGNOSIS — B9620 Unspecified Escherichia coli [E. coli] as the cause of diseases classified elsewhere: Secondary | ICD-10-CM | POA: Diagnosis present

## 2021-03-06 DIAGNOSIS — E1165 Type 2 diabetes mellitus with hyperglycemia: Secondary | ICD-10-CM | POA: Diagnosis present

## 2021-03-06 DIAGNOSIS — I313 Pericardial effusion (noninflammatory): Secondary | ICD-10-CM | POA: Diagnosis present

## 2021-03-06 DIAGNOSIS — E785 Hyperlipidemia, unspecified: Secondary | ICD-10-CM | POA: Diagnosis present

## 2021-03-06 DIAGNOSIS — I110 Hypertensive heart disease with heart failure: Secondary | ICD-10-CM | POA: Diagnosis present

## 2021-03-06 DIAGNOSIS — J189 Pneumonia, unspecified organism: Secondary | ICD-10-CM

## 2021-03-06 DIAGNOSIS — E1151 Type 2 diabetes mellitus with diabetic peripheral angiopathy without gangrene: Secondary | ICD-10-CM | POA: Diagnosis present

## 2021-03-06 DIAGNOSIS — Z8619 Personal history of other infectious and parasitic diseases: Secondary | ICD-10-CM

## 2021-03-06 DIAGNOSIS — N390 Urinary tract infection, site not specified: Secondary | ICD-10-CM | POA: Diagnosis present

## 2021-03-06 DIAGNOSIS — I10 Essential (primary) hypertension: Secondary | ICD-10-CM | POA: Diagnosis present

## 2021-03-06 DIAGNOSIS — Z882 Allergy status to sulfonamides status: Secondary | ICD-10-CM

## 2021-03-06 DIAGNOSIS — Z91012 Allergy to eggs: Secondary | ICD-10-CM

## 2021-03-06 DIAGNOSIS — Z823 Family history of stroke: Secondary | ICD-10-CM

## 2021-03-06 DIAGNOSIS — I739 Peripheral vascular disease, unspecified: Secondary | ICD-10-CM

## 2021-03-06 DIAGNOSIS — Z880 Allergy status to penicillin: Secondary | ICD-10-CM

## 2021-03-06 DIAGNOSIS — K219 Gastro-esophageal reflux disease without esophagitis: Secondary | ICD-10-CM | POA: Diagnosis present

## 2021-03-06 DIAGNOSIS — Z79899 Other long term (current) drug therapy: Secondary | ICD-10-CM

## 2021-03-06 DIAGNOSIS — R778 Other specified abnormalities of plasma proteins: Secondary | ICD-10-CM

## 2021-03-06 DIAGNOSIS — G9341 Metabolic encephalopathy: Secondary | ICD-10-CM | POA: Diagnosis present

## 2021-03-06 DIAGNOSIS — H903 Sensorineural hearing loss, bilateral: Secondary | ICD-10-CM | POA: Diagnosis present

## 2021-03-06 DIAGNOSIS — D638 Anemia in other chronic diseases classified elsewhere: Secondary | ICD-10-CM | POA: Diagnosis present

## 2021-03-06 DIAGNOSIS — R509 Fever, unspecified: Secondary | ICD-10-CM

## 2021-03-06 DIAGNOSIS — Z833 Family history of diabetes mellitus: Secondary | ICD-10-CM

## 2021-03-06 DIAGNOSIS — I350 Nonrheumatic aortic (valve) stenosis: Secondary | ICD-10-CM | POA: Diagnosis present

## 2021-03-06 DIAGNOSIS — Z885 Allergy status to narcotic agent status: Secondary | ICD-10-CM

## 2021-03-06 DIAGNOSIS — J449 Chronic obstructive pulmonary disease, unspecified: Secondary | ICD-10-CM | POA: Diagnosis present

## 2021-03-06 DIAGNOSIS — Z993 Dependence on wheelchair: Secondary | ICD-10-CM

## 2021-03-06 DIAGNOSIS — E119 Type 2 diabetes mellitus without complications: Secondary | ICD-10-CM

## 2021-03-06 DIAGNOSIS — K5900 Constipation, unspecified: Secondary | ICD-10-CM | POA: Diagnosis present

## 2021-03-06 DIAGNOSIS — E46 Unspecified protein-calorie malnutrition: Secondary | ICD-10-CM

## 2021-03-06 DIAGNOSIS — E8809 Other disorders of plasma-protein metabolism, not elsewhere classified: Secondary | ICD-10-CM | POA: Diagnosis present

## 2021-03-06 DIAGNOSIS — J1282 Pneumonia due to coronavirus disease 2019: Secondary | ICD-10-CM | POA: Diagnosis present

## 2021-03-06 LAB — CBC WITH DIFFERENTIAL/PLATELET
Abs Immature Granulocytes: 0.03 10*3/uL (ref 0.00–0.07)
Basophils Absolute: 0 10*3/uL (ref 0.0–0.1)
Basophils Relative: 1 %
Eosinophils Absolute: 0 10*3/uL (ref 0.0–0.5)
Eosinophils Relative: 0 %
HCT: 33.7 % — ABNORMAL LOW (ref 36.0–46.0)
Hemoglobin: 10.5 g/dL — ABNORMAL LOW (ref 12.0–15.0)
Immature Granulocytes: 1 %
Lymphocytes Relative: 19 %
Lymphs Abs: 1.2 10*3/uL (ref 0.7–4.0)
MCH: 25.9 pg — ABNORMAL LOW (ref 26.0–34.0)
MCHC: 31.2 g/dL (ref 30.0–36.0)
MCV: 83 fL (ref 80.0–100.0)
Monocytes Absolute: 0.9 10*3/uL (ref 0.1–1.0)
Monocytes Relative: 14 %
Neutro Abs: 4.2 10*3/uL (ref 1.7–7.7)
Neutrophils Relative %: 65 %
Platelets: 179 10*3/uL (ref 150–400)
RBC: 4.06 MIL/uL (ref 3.87–5.11)
RDW: 15.5 % (ref 11.5–15.5)
WBC: 6.4 10*3/uL (ref 4.0–10.5)
nRBC: 0 % (ref 0.0–0.2)

## 2021-03-06 LAB — LACTIC ACID, PLASMA: Lactic Acid, Venous: 1.2 mmol/L (ref 0.5–1.9)

## 2021-03-06 LAB — COMPREHENSIVE METABOLIC PANEL
ALT: 23 U/L (ref 0–44)
AST: 28 U/L (ref 15–41)
Albumin: 3.1 g/dL — ABNORMAL LOW (ref 3.5–5.0)
Alkaline Phosphatase: 58 U/L (ref 38–126)
Anion gap: 7 (ref 5–15)
BUN: 15 mg/dL (ref 8–23)
CO2: 24 mmol/L (ref 22–32)
Calcium: 8.8 mg/dL — ABNORMAL LOW (ref 8.9–10.3)
Chloride: 105 mmol/L (ref 98–111)
Creatinine, Ser: 0.61 mg/dL (ref 0.44–1.00)
GFR, Estimated: >60 mL/min (ref >60)
Glucose, Bld: 192 mg/dL — ABNORMAL HIGH (ref 70–99)
Potassium: 4.2 mmol/L (ref 3.5–5.1)
Sodium: 136 mmol/L (ref 135–145)
Total Bilirubin: 0.5 mg/dL (ref 0.3–1.2)
Total Protein: 6.5 g/dL (ref 6.5–8.1)

## 2021-03-06 LAB — PROTIME-INR
INR: 1.1 (ref 0.8–1.2)
Prothrombin Time: 14 seconds (ref 11.4–15.2)

## 2021-03-06 MED ORDER — SODIUM CHLORIDE 0.9 % IV SOLN
1.0000 g | Freq: Three times a day (TID) | INTRAVENOUS | Status: DC
  Administered 2021-03-07 (×2): 1 g via INTRAVENOUS
  Filled 2021-03-06 (×2): qty 1

## 2021-03-06 MED ORDER — LACTATED RINGERS IV SOLN: INTRAVENOUS | Status: DC

## 2021-03-06 MED ORDER — SODIUM CHLORIDE 0.9 % IV SOLN: 2.0000 g | Freq: Once | INTRAVENOUS | Status: DC

## 2021-03-06 MED ORDER — VANCOMYCIN HCL IN DEXTROSE 1-5 GM/200ML-% IV SOLN
1000.0000 mg | Freq: Once | INTRAVENOUS | Status: DC
  Filled 2021-03-06: qty 200

## 2021-03-06 MED ORDER — METRONIDAZOLE 500 MG/100ML IV SOLN: 500.0000 mg | Freq: Once | INTRAVENOUS | Status: DC

## 2021-03-06 MED ORDER — LACTATED RINGERS IV BOLUS (SEPSIS)
1000.0000 mL | Freq: Once | INTRAVENOUS | Status: AC
  Administered 2021-03-07: 1000 mL via INTRAVENOUS

## 2021-03-06 NOTE — ED Triage Notes (Signed)
Pt brought in by [facility] for fever. Pt from home and husband states pt "not acting like herself for past few days" and he noted her to have a fever of 101.

## 2021-03-06 NOTE — ED Notes (Signed)
Pt placed on cardiac monitor, pt listless, pt not answering questions but speaks when tourniquet placed, saying "ow, that hurts". Peri care provided, pt vaginal area unclean and stooling. Pt cleaned and purewick placed.

## 2021-03-06 NOTE — ED Provider Notes (Signed)
[facility] EMERGENCY DEPARTMENT Provider Note   CSN: [id number] Arrival date & time: 03/06/21  2203     History Chief Complaint  Patient presents with   Fever    [name] is a 80 y.o. female.  Level 5 caveat for altered mental status.  Patient unable to give much of a history.  She was brought in by EMS from home with report of fever and not acting like her self for several days.  Fever of 101 on arrival.  Patient is oriented to person and place only. She has a history of CHF, neck stenosis, diabetes, chronic peripheral vascular disease with necrotic toes, previous stroke. States she has had abdominal pain has not had a bowel movement for several days.  No vomiting. No cough, runny nose, sore throat, dysuria or hematuria.  No chest pain or shortness of breath.  Discussed with patient's husband [name] by phone.  He states she has not been herself for the past 2 or 3 days.  Fever tonight to 101.  Patient recently underwent revascularization of her left leg and is due to have it done on the right as well. He is not aware of any cough, runny nose, sore throat, vomiting   Fever     Past Medical History:  Diagnosis Date   Acute heart failure (HCC)    Aortic stenosis    CHF (congestive heart failure) ([place])    Complication of anesthesia    Hard to wake patient up after having anesthesia   Diabetes mellitus without complication ([place])    Diabetic neuropathy ([place])    Takes Lyrica   Edema of both legs    Takes Lasix   GERD (gastroesophageal reflux disease)    High cholesterol    HTN (hypertension)    Hypertension    PAD (peripheral artery disease) (HCC)    Shortness of breath dyspnea    Spasm of back muscles    Stroke (HCC)    Wound, open    Right great toe    Patient Active Problem List   Diagnosis Date Noted   COPD (chronic obstructive pulmonary disease) ([place]) 10/20/2020   GERD (gastroesophageal reflux disease) 10/20/2020   Physical deconditioning 10/20/2020    Cellulitis 10/19/2020   Necrotic toes (HCC)    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)    Diabetic neuropathy (HCC)    Gangrene of toe of both feet ([place]) 06/06/2019   PAD (peripheral artery disease) ([place]) 04/27/2019   ESBL (extended spectrum beta-lactamase) producing bacteria infection 12/25/2018   PVD (peripheral vascular disease) ([place]) 09/12/2016   Ulcer of right lower extremity ([place]) 08/29/2016   Acute on chronic diastolic heart failure ([place]) 07/08/2016   HTN (hypertension) 07/05/2016   Sensorineural hearing loss (SNHL), 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   Chronic diastolic CHF (congestive heart failure) ([place]) 03/10/2016   HLD (hyperlipidemia) 03/09/2016   Acute respiratory acidosis 03/05/2016   Anemia, iron deficiency 03/03/2016   Cerebrovascular accident (CVA) due to thrombosis of precerebral artery ([place]) 03/01/2016   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 ([place]) 03/01/2016   Atherosclerosis of native arteries of the extremities with gangrene ([place]) 12/08/2015   Anemia, chronic disease 04/27/2014   Unspecified hereditary and idiopathic peripheral neuropathy 03/14/2014   DM2 (diabetes mellitus, type 2) ([place]) 02/14/2014   Aortic stenosis 02/04/2014   Closed left subtrochanteric femur fracture ([place]) 02/03/2014   Hip fracture, left ([place]) 02/03/2014   Fibula fracture 02/03/2014   MTP instability 02/03/2014   Hip fracture ([place])  02/03/2014    Past Surgical History:  Procedure Laterality Date   AMPUTATION TOE Left 06/09/2019   Procedure: Left third toe and partial great toe amputation and debridement;  Surgeon: Katha Cabal, MD;  Location: [facility];  Service: Vascular;  Laterality: Left;   AMPUTATION TOE Left 04/06/2020   Procedure: AMPUTATION LEFT SECOND TOE;  Surgeon: Samara Deist, DPM;  Location: [facility];  Service: Podiatry;  Laterality: Left;   BYPASS GRAFT POPLITEAL TO TIBIAL Right 02/28/2016   Procedure: BYPASS GRAFT RIGHT BELOW KNEE POPLITEAL TO PERONEAL  USING REVERSED RIGHT GREATER SAPHENOUS VEIN;  Surgeon: Conrad Heuvelton, MD;  Location: [place];  Service: Vascular;  Laterality: Right;   CYST EXCISION     Abdomen   EYE SURGERY Bilateral    Cataract removal   INTRAMEDULLARY (IM) NAIL INTERTROCHANTERIC Left 02/04/2014   Procedure: INTRAMEDULLARY (IM) NAIL INTERTROCHANTRIC FEMORAL;  Surgeon: Mauri Pole, MD;  Location: [place];  Service: Orthopedics;  Laterality: Left;   IR GENERIC HISTORICAL  03/01/2016   IR ANGIO INTRA EXTRACRAN SEL COM CAROTID INNOMINATE UNI R MOD SED 03/01/2016 [name], MD [facility]   IR GENERIC HISTORICAL  03/01/2016   IR ENDOVASC INTRACRANIAL INF OTHER THAN THROMBO ART INC DIAG ANGIO 03/01/2016 [name], MD [facility]   IR GENERIC HISTORICAL  03/01/2016   IR INTRAVSC STENT CERV CAROTID W/O EMB-PROT MOD SED INC ANGIO 03/01/2016 [name], MD [facility]   IR GENERIC HISTORICAL  06/03/2016   IR RADIOLOGIST EVAL & MGMT 06/03/2016 [facility]   LOWER EXTREMITY ANGIOGRAPHY Left 02/09/2019   Procedure: LOWER EXTREMITY ANGIOGRAPHY;  Surgeon: Katha Cabal, MD;  Location: [place] [facility];  Service: Cardiovascular;  Laterality: Left;   LOWER EXTREMITY ANGIOGRAPHY Left 03/10/2019   Procedure: LOWER EXTREMITY ANGIOGRAPHY;  Surgeon: Katha Cabal, MD;  Location: [place] [facility];  Service: Cardiovascular;  Laterality: Left;   LOWER EXTREMITY ANGIOGRAPHY Left 04/27/2019   Procedure: LOWER EXTREMITY ANGIOGRAPHY;  Surgeon: Katha Cabal, MD;  Location: [place] [facility];  Service: Cardiovascular;  Laterality: Left;   LOWER EXTREMITY ANGIOGRAPHY Left 06/08/2019   Procedure: Lower Extremity Angiography;  Surgeon: Katha Cabal, MD;  Location: [place] [facility];  Service: Cardiovascular;  Laterality: Left;   LOWER EXTREMITY ANGIOGRAPHY Left 04/03/2020   Procedure: Lower Extremity Angiography;  Surgeon: Algernon Huxley, MD;  Location: [place] [facility];  Service: Cardiovascular;   Laterality: Left;   LOWER EXTREMITY ANGIOGRAPHY Right 10/20/2020   Procedure: Lower Extremity Angiography;  Surgeon: Katha Cabal, MD;  Location: [place] [facility];  Service: Cardiovascular;  Laterality: Right;   LOWER EXTREMITY ANGIOGRAPHY Left 01/23/2021   Procedure: LOWER EXTREMITY ANGIOGRAPHY;  Surgeon: Katha Cabal, MD;  Location: [place] [facility];  Service: Cardiovascular;  Laterality: Left;   ORIF TOE FRACTURE Right 02/08/2014   Procedure: OPEN REDUCTION INTERNAL FIXATION Right METATARSAL  FRACTURE ;  Surgeon: Wylene Simmer, MD;  Location: [place];  Service: Orthopedics;  Laterality: Right;   PERIPHERAL VASCULAR CATHETERIZATION N/A 12/28/2015   Procedure: Abdominal Aortogram w/Lower Extremity;  Surgeon: Conrad Conway, MD;  Location: [place] [facility];  Service: Cardiovascular;  Laterality: N/A;   RADIOLOGY WITH ANESTHESIA N/A 03/01/2016   Procedure: RADIOLOGY WITH ANESTHESIA;  Surgeon: [name], MD;  Location: [place];  Service: Radiology;  Laterality: N/A;   VEIN HARVEST Right 02/28/2016   Procedure: RIGHT GREATER SAPHENOUS VEIN HARVEST;  Surgeon: Conrad Sunwest, MD;  Location: [place];  Service: Vascular;  Laterality: Right;     OB History     Gravida  6   Para  2   Term  2   Preterm      AB  4   Living  2      SAB  4   IAB      Ectopic      Multiple      Live Births              Family History  Problem Relation Age of Onset   Diabetes Other    Liver disease Mother    CVA Father    Diabetes Father    Hypertension Father     Social History   Tobacco Use   Smoking status: Never   Smokeless tobacco: Never   Tobacco comments:    Never smoked  Substance Use Topics   Alcohol use: No    Alcohol/week: 0.0 standard drinks   Drug use: No    Home Medications Prior to Admission medications   Medication Sig Start Date End Date Taking? Authorizing Provider  acetaminophen (TYLENOL) 500 MG tablet Take 1,000 mg by mouth every 6 (six) hours as  needed for mild pain.    [provider]  amLODipine (NORVASC) 10 MG tablet Take 10 mg by mouth daily.    [provider]  aspirin EC 81 MG EC tablet Take 1 tablet (81 mg total) by mouth daily. 04/29/19   Stegmayer, Joelene Millin A, PA-C  atorvastatin (LIPITOR) 20 MG tablet Take 1 tablet (20 mg total) by mouth daily. Patient taking differently: Take 20 mg by mouth at bedtime. 03/13/16   Virgina Jock A, PA-C  budesonide (PULMICORT) 0.25 MG/2ML nebulizer solution Take 2 mLs (0.25 mg total) by nebulization 2 (two) times daily. 06/10/18   Dustin Flock, MD  carvedilol (COREG) 6.25 MG tablet Take 6.25 mg by mouth 2 (two) times daily with a meal.    [provider]  cetirizine (ZYRTEC) 10 MG tablet Take 10 mg by mouth daily.    [provider]  cholecalciferol (VITAMIN D3) 25 MCG (1000 UNIT) tablet Take 1,000 Units by mouth daily.    [provider]  ciclopirox (PENLAC) 8 % solution Apply 1 application topically daily. 12/05/20   [provider]  clopidogrel (PLAVIX) 75 MG tablet Take 1 tablet (75 mg total) by mouth daily with breakfast. 03/04/17   Conrad Bennington, MD  diclofenac Sodium (VOLTAREN) 1 % GEL Apply 1 application topically 4 (four) times daily as needed (pain).    [provider]  docusate sodium (COLACE) 100 MG capsule Take 100 mg by mouth daily.    [provider]  fluticasone (FLONASE) 50 MCG/ACT nasal spray Place 1 spray into both nostrils daily as needed for allergies.     [provider]  gabapentin (NEURONTIN) 300 MG capsule Take 300 mg by mouth at bedtime.    [provider]  hydrALAZINE (APRESOLINE) 25 MG tablet Take 1 tablet (25 mg total) by mouth every 8 (eight) hours. Patient taking differently: Take 25 mg by mouth in the morning and at bedtime. 12/28/18   Fritzi Mandes, MD  insulin glargine (LANTUS) 100 UNIT/ML injection Inject 0.15 mLs (15 Units total) into the skin at bedtime. 12/28/18   Fritzi Mandes, MD   magnesium oxide (MAG-OX) 400 MG tablet Take 400 mg by mouth 2 (two) times daily.     [provider]  modafinil (PROVIGIL) 200 MG tablet Take 200  mg by mouth daily.    [provider]  omeprazole (PRILOSEC) 20 MG capsule Take 1 capsule (20 mg total) by mouth daily. 04/17/18   Henreitta Leber, MD  polyethylene glycol (MIRALAX / GLYCOLAX) packet Take 17 g by mouth 2 (two) times daily. Patient not taking: No sig reported 02/09/14   Thurnell Lose, MD  potassium chloride SA (K-DUR,KLOR-CON) 20 MEQ tablet Take 20 mEq by mouth daily.    [provider]  torsemide (DEMADEX) 20 MG tablet Take 1 tablet (20 mg total) by mouth 2 (two) times daily. 12/28/18   Fritzi Mandes, MD  Vitamin E 180 MG CAPS Take 180 mg by mouth daily.    [provider]    Allergies    Iodine, Penicillins, Shellfish allergy, Sulfa antibiotics, Sulfacetamide sodium, Sulfasalazine, Eggs or egg-derived products, and Morphine and related  Review of Systems   Review of Systems  Unable to perform ROS: Mental status change  Constitutional:  Positive for fever.   Physical Exam Updated Vital Signs BP 118/70   Pulse 73   Temp (!) 101 F (38.3 C) (Rectal)   Resp 18   Ht '5\' 8"'$  (1.727 m)   Wt 63 kg   SpO2 91%   BMI 21.12 kg/m   Physical Exam Vitals and nursing note reviewed.  Constitutional:      General: She is not in acute distress.    Appearance: Normal appearance. She is well-developed and normal weight. She is ill-appearing.     Comments: Somnolent, arouses to voice, oriented to person and place  HENT:     Head: Normocephalic and atraumatic.     Mouth/Throat:     Mouth: Mucous membranes are dry.     Pharynx: No oropharyngeal exudate.  Eyes:     Conjunctiva/sclera: Conjunctivae normal.     Pupils: Pupils are equal, round, and reactive to light.  Neck:     Comments: No meningismus. Cardiovascular:     Rate and Rhythm: Normal rate and regular rhythm.     Heart sounds: Normal  heart sounds. No murmur heard. Pulmonary:     Effort: Pulmonary effort is normal. No respiratory distress.     Breath sounds: Normal breath sounds.  Abdominal:     Palpations: Abdomen is soft.     Tenderness: There is abdominal tenderness. There is no guarding or rebound.     Comments: Mild diffuse tenderness.  No guarding or rebound  Musculoskeletal:        General: No tenderness. Normal range of motion.     Cervical back: Normal range of motion and neck supple.     Comments: Multiple previous toe amputations with necrotic toes bilaterally.  Able to Doppler PT pulse on the left, unable to Doppler pulse on the right  Skin:    General: Skin is warm.  Neurological:     Cranial Nerves: No cranial nerve deficit.     Motor: No abnormal muscle tone.     Coordination: Coordination normal.     Comments: Oriented to person and place.  No appreciable gross deficits.  Moves all extremities but globally weak.  No facial droop  Psychiatric:        Behavior: Behavior normal.    ED Results / Procedures / Treatments   Labs (all labs ordered are listed, but only abnormal results are displayed) Labs Reviewed  RESP PANEL BY RT-PCR (FLU A&B, COVID) ARPGX2 - Abnormal; Notable for the following components:      Result Value  SARS Coronavirus 2 by RT PCR POSITIVE (*)    All other components within normal limits  COMPREHENSIVE METABOLIC PANEL - Abnormal; Notable for the following components:   Glucose, Bld 192 (*)    Calcium 8.8 (*)    Albumin 3.1 (*)    All other components within normal limits  CBC WITH DIFFERENTIAL/PLATELET - Abnormal; Notable for the following components:   Hemoglobin 10.5 (*)    HCT 33.7 (*)    MCH 25.9 (*)    All other components within normal limits  TROPONIN I (HIGH SENSITIVITY) - Abnormal; Notable for the following components:   Troponin I (High Sensitivity) 68 (*)    All other components within normal limits  CULTURE, BLOOD (ROUTINE X 2)  CULTURE, BLOOD (ROUTINE X 2)   URINE CULTURE  LACTIC ACID, PLASMA  LACTIC ACID, PLASMA  PROTIME-INR  APTT  URINALYSIS, ROUTINE W REFLEX MICROSCOPIC  TROPONIN I (HIGH SENSITIVITY)    EKG EKG Interpretation  Date/Time:  Tuesday March 06 2021 22:17:20 EDT Ventricular Rate:  77 PR Interval:  151 QRS Duration: 74 QT Interval:  368 QTC Calculation: 417 R Axis:   88 Text Interpretation: Sinus rhythm Borderline right axis deviation Nonspecific repol abnormality, diffuse leads ST depressions inferior and laterally Confirmed by Ezequiel Essex [id number]) on 03/07/2021 12:30:35 AM  Radiology CT ABDOMEN PELVIS WO CONTRAST  Result Date: 03/06/2021 CLINICAL DATA:  Sepsis, febrile, unresponsive EXAM: CT ABDOMEN AND PELVIS WITHOUT CONTRAST TECHNIQUE: Multidetector CT imaging of the abdomen and pelvis was performed following the standard protocol without IV contrast. COMPARISON:  03/03/2016, 03/06/2021 FINDINGS: Lower chest: There are small bilateral pleural effusions, left greater than right. There is a left pericardial effusion measuring up to 2.9 cm in thickness along the posterior wall of the left ventricle. This is increased since prior study. Dense calcification of the aortic and mitral valves. Hepatobiliary: Calcified gallstone is identified without cholecystitis. Unenhanced imaging of the liver demonstrates no focal abnormalities. Pancreas: Unremarkable. No pancreatic ductal dilatation or surrounding inflammatory changes. Spleen: Normal in size without focal abnormality. Adrenals/Urinary Tract: There are bilateral nonobstructing renal calculi, measuring 4 mm on the left and 2 mm on the right. Bladder is unremarkable. Stable nodularity of the left adrenal gland consistent with adenoma. The right adrenals unremarkable. Stomach/Bowel: No bowel obstruction or ileus. Moderate retained stool within the distal colon. There is distal colonic diverticulosis without diverticulitis. Normal appendix right lower quadrant. No bowel wall  thickening or inflammatory change. Vascular/Lymphatic: Aortic atherosclerosis. No enlarged abdominal or pelvic lymph nodes. Reproductive: Uterus and bilateral adnexa are unremarkable. Calcified uterine fibroid again noted. Other: No free fluid or free intraperitoneal gas. Fat containing left inguinal hernia. No bowel herniation. Musculoskeletal: No acute or destructive bony lesions. Postsurgical changes proximal left femur. Reconstructed images demonstrate no additional findings. IMPRESSION: 1. Small bilateral pleural effusions, left greater than right. 2. Moderate pericardial effusion along the posterior aspect of the left ventricle. This may be loculated. 3. Cholelithiasis without cholecystitis. 4. Bilateral nonobstructing renal calculi. 5. Distal colonic diverticulosis without diverticulitis. 6.  Aortic Atherosclerosis (ICD10-I70.0). Electronically Signed   By: Randa Ngo M.D.   On: 03/06/2021 23:47   CT HEAD WO CONTRAST (5MM)  Result Date: 03/06/2021 CLINICAL DATA:  Delirium EXAM: CT HEAD WITHOUT CONTRAST TECHNIQUE: Contiguous axial images were obtained from the base of the skull through the vertex without intravenous contrast. COMPARISON:  Head CT 10/19/2020 FINDINGS: Brain: There is no mass, hemorrhage or extra-axial collection. There is generalized atrophy without lobar predilection. Hypodensity of  the white matter is most commonly associated with chronic microvascular disease. Unchanged calcified mass in the right posterior fossa likely a meningioma. Unchanged focus of encephalomalacia in the right parietal lobe. Vascular: Atherosclerotic calcification of the internal carotid arteries at the skull base. No abnormal hyperdensity of the major intracranial arteries or dural venous sinuses. Skull: The visualized skull base, calvarium and extracranial soft tissues are normal. Sinuses/Orbits: No fluid levels or advanced mucosal thickening of the visualized paranasal sinuses. No mastoid or middle ear  effusion. The orbits are normal. IMPRESSION: 1. No acute intracranial abnormality. 2. Unchanged right posterior fossa meningioma. 3. Chronic microvascular ischemia and generalized atrophy. Electronically Signed   By: Ulyses Jarred M.D.   On: 03/06/2021 23:56   DG Chest Portable 1 View  Result Date: 03/06/2021 CLINICAL DATA:  Fever. EXAM: PORTABLE CHEST 1 VIEW COMPARISON:  March 30, 2020 FINDINGS: Moderate severity left basilar atelectasis and/or infiltrate is seen. There is a small left pleural effusion. No pneumothorax is identified. The cardiac silhouette is normal in size. Right-sided tracheal deviation is seen. A radiopaque vascular stent is noted within the neck soft tissues on the left. Degenerative changes are seen throughout the thoracic spine. IMPRESSION: 1. Moderate severity left basilar atelectasis and/or infiltrate. 2. Small left pleural effusion. Electronically Signed   By: Virgina Norfolk M.D.   On: 03/06/2021 22:50    Procedures .Critical Care  Date/Time: 03/07/2021 1:07 AM Performed by: Ezequiel Essex, MD Authorized by: Ezequiel Essex, MD   Critical care provider statement:    Critical care time (minutes):  45   Critical care was necessary to treat or prevent imminent or life-threatening deterioration of the following conditions:  Sepsis   Critical care was time spent personally by me on the following activities:  Discussions with consultants, evaluation of patient's response to treatment, examination of patient, ordering and performing treatments and interventions, ordering and review of laboratory studies, ordering and review of radiographic studies, pulse oximetry, re-evaluation of patient's condition, obtaining history from patient or surrogate and review of old charts   Medications Ordered in ED Medications  lactated ringers infusion (has no administration in time range)  lactated ringers bolus 1,000 mL (has no administration in time range)  aztreonam (AZACTAM) 2 g in  sodium chloride 0.9 % 100 mL IVPB (has no administration in time range)  metroNIDAZOLE (FLAGYL) IVPB 500 mg (has no administration in time range)  vancomycin (VANCOCIN) IVPB 1000 mg/200 mL premix (has no administration in time range)    ED Course  I have reviewed the triage vital signs and the nursing notes.  Pertinent labs & imaging results that were available during my care of the patient were reviewed by me and considered in my medical decision making (see chart for details).    MDM Rules/Calculators/A&P                          Patient from home with altered mental status.  She is febrile with concern for sepsis.  Code sepsis activated on arrival.  Broad-spectrum antibiotics started after cultures are obtained.  Meropenem started given her history of ESBL.  Chest x-ray is concerning for possible left-sided infiltrate. Lactate is normal, no leukocytosis  COVID test is positive.  Patient does have some subtle EKG changes with T wave inversions inferiorly and laterally that are new.  She denies chest pain but her history is unreliable.  Troponin is 68.  CT head is stable With her complaint of abdominal  pain, CT abdomen pelvis was obtained.  This shows pleural effusions as well as a pericardial effusion with possible loculation.  She does not have any clinical evidence of tamponade currently  Plan admission for sepsis likely from pneumonia as well as COVID-19 infection. IV remdesivir initiated as well as IV antibiotics.  No indication for steroids as she is not hypoxic has no increased work of breathing Given EKG changes as well as pericardial effusion will need echocardiogram in the morning.  Admission discussed with Dr. Josephine Cables.  NARYA LUQUIN was evaluated in Emergency Department on 03/06/2021 for the symptoms described in the history of present illness. She was evaluated in the context of the global COVID-19 pandemic, which necessitated consideration that the patient might be at  risk for infection with the SARS-CoV-2 virus that causes COVID-19. Institutional protocols and algorithms that pertain to the evaluation of patients at risk for COVID-19 are in a state of rapid change based on information released by regulatory bodies including the CDC and federal and state organizations. These policies and algorithms were followed during the patient's care in the ED.  Final Clinical Impression(s) / ED Diagnoses Final diagnoses:  Community acquired pneumonia of left lower lobe of lung  COVID-19    Rx / DC Orders ED Discharge Orders     None        [place] Sherburn, Annie Main, MD 03/07/21 [phone]

## 2021-03-07 ENCOUNTER — Ambulatory Visit: Payer: Medicare HMO

## 2021-03-07 DIAGNOSIS — Z89422 Acquired absence of other left toe(s): Secondary | ICD-10-CM | POA: Diagnosis not present

## 2021-03-07 DIAGNOSIS — I739 Peripheral vascular disease, unspecified: Secondary | ICD-10-CM

## 2021-03-07 DIAGNOSIS — E1165 Type 2 diabetes mellitus with hyperglycemia: Secondary | ICD-10-CM | POA: Diagnosis present

## 2021-03-07 DIAGNOSIS — I248 Other forms of acute ischemic heart disease: Secondary | ICD-10-CM | POA: Diagnosis present

## 2021-03-07 DIAGNOSIS — J440 Chronic obstructive pulmonary disease with acute lower respiratory infection: Secondary | ICD-10-CM | POA: Diagnosis present

## 2021-03-07 DIAGNOSIS — U071 COVID-19: Secondary | ICD-10-CM | POA: Diagnosis present

## 2021-03-07 DIAGNOSIS — K5900 Constipation, unspecified: Secondary | ICD-10-CM | POA: Diagnosis present

## 2021-03-07 DIAGNOSIS — E46 Unspecified protein-calorie malnutrition: Secondary | ICD-10-CM

## 2021-03-07 DIAGNOSIS — I313 Pericardial effusion (noninflammatory): Secondary | ICD-10-CM | POA: Diagnosis present

## 2021-03-07 DIAGNOSIS — J189 Pneumonia, unspecified organism: Secondary | ICD-10-CM | POA: Diagnosis present

## 2021-03-07 DIAGNOSIS — I110 Hypertensive heart disease with heart failure: Secondary | ICD-10-CM | POA: Diagnosis present

## 2021-03-07 DIAGNOSIS — N390 Urinary tract infection, site not specified: Secondary | ICD-10-CM | POA: Diagnosis present

## 2021-03-07 DIAGNOSIS — E1151 Type 2 diabetes mellitus with diabetic peripheral angiopathy without gangrene: Secondary | ICD-10-CM | POA: Diagnosis present

## 2021-03-07 DIAGNOSIS — K219 Gastro-esophageal reflux disease without esophagitis: Secondary | ICD-10-CM

## 2021-03-07 DIAGNOSIS — E8809 Other disorders of plasma-protein metabolism, not elsewhere classified: Secondary | ICD-10-CM | POA: Diagnosis present

## 2021-03-07 DIAGNOSIS — D638 Anemia in other chronic diseases classified elsewhere: Secondary | ICD-10-CM | POA: Diagnosis present

## 2021-03-07 DIAGNOSIS — I10 Essential (primary) hypertension: Secondary | ICD-10-CM

## 2021-03-07 DIAGNOSIS — E1140 Type 2 diabetes mellitus with diabetic neuropathy, unspecified: Secondary | ICD-10-CM | POA: Diagnosis present

## 2021-03-07 DIAGNOSIS — I5033 Acute on chronic diastolic (congestive) heart failure: Secondary | ICD-10-CM | POA: Diagnosis present

## 2021-03-07 DIAGNOSIS — G9341 Metabolic encephalopathy: Secondary | ICD-10-CM | POA: Diagnosis present

## 2021-03-07 DIAGNOSIS — R778 Other specified abnormalities of plasma proteins: Secondary | ICD-10-CM

## 2021-03-07 DIAGNOSIS — J1282 Pneumonia due to coronavirus disease 2019: Secondary | ICD-10-CM | POA: Diagnosis present

## 2021-03-07 DIAGNOSIS — Z8673 Personal history of transient ischemic attack (TIA), and cerebral infarction without residual deficits: Secondary | ICD-10-CM | POA: Diagnosis not present

## 2021-03-07 DIAGNOSIS — R509 Fever, unspecified: Secondary | ICD-10-CM

## 2021-03-07 DIAGNOSIS — Z79899 Other long term (current) drug therapy: Secondary | ICD-10-CM | POA: Diagnosis not present

## 2021-03-07 DIAGNOSIS — E782 Mixed hyperlipidemia: Secondary | ICD-10-CM

## 2021-03-07 DIAGNOSIS — J449 Chronic obstructive pulmonary disease, unspecified: Secondary | ICD-10-CM | POA: Diagnosis not present

## 2021-03-07 DIAGNOSIS — Z8249 Family history of ischemic heart disease and other diseases of the circulatory system: Secondary | ICD-10-CM | POA: Diagnosis not present

## 2021-03-07 DIAGNOSIS — I5032 Chronic diastolic (congestive) heart failure: Secondary | ICD-10-CM | POA: Diagnosis not present

## 2021-03-07 DIAGNOSIS — H903 Sensorineural hearing loss, bilateral: Secondary | ICD-10-CM | POA: Diagnosis present

## 2021-03-07 DIAGNOSIS — Z7902 Long term (current) use of antithrombotics/antiplatelets: Secondary | ICD-10-CM | POA: Diagnosis not present

## 2021-03-07 DIAGNOSIS — Z7982 Long term (current) use of aspirin: Secondary | ICD-10-CM | POA: Diagnosis not present

## 2021-03-07 DIAGNOSIS — I350 Nonrheumatic aortic (valve) stenosis: Secondary | ICD-10-CM | POA: Diagnosis present

## 2021-03-07 LAB — URINALYSIS, ROUTINE W REFLEX MICROSCOPIC
Bilirubin Urine: NEGATIVE
Glucose, UA: NEGATIVE mg/dL
Hgb urine dipstick: NEGATIVE
Ketones, ur: NEGATIVE mg/dL
Nitrite: NEGATIVE
Protein, ur: NEGATIVE mg/dL
Specific Gravity, Urine: 1.009 (ref 1.005–1.030)
WBC, UA: >50 WBC/hpf — ABNORMAL HIGH (ref 0–5)
pH: 6 (ref 5.0–8.0)

## 2021-03-07 LAB — HEMOGLOBIN A1C
Hgb A1c MFr Bld: 8.3 % — ABNORMAL HIGH (ref 4.8–5.6)
Mean Plasma Glucose: 191.51 mg/dL

## 2021-03-07 LAB — TROPONIN I (HIGH SENSITIVITY)
Troponin I (High Sensitivity): 68 ng/L — ABNORMAL HIGH (ref <18)
Troponin I (High Sensitivity): 91 ng/L — ABNORMAL HIGH (ref <18)
Troponin I (High Sensitivity): 70 ng/L — ABNORMAL HIGH (ref <18)
Troponin I (High Sensitivity): 109 ng/L (ref <18)

## 2021-03-07 LAB — RESP PANEL BY RT-PCR (FLU A&B, COVID) ARPGX2
Influenza A by PCR: NEGATIVE
Influenza B by PCR: NEGATIVE
SARS Coronavirus 2 by RT PCR: POSITIVE — AB

## 2021-03-07 LAB — GLUCOSE, CAPILLARY: Glucose-Capillary: 123 mg/dL — ABNORMAL HIGH (ref 70–99)

## 2021-03-07 LAB — PROCALCITONIN: Procalcitonin: <0.1 ng/mL

## 2021-03-07 LAB — AMMONIA: Ammonia: 22 umol/L (ref 9–35)

## 2021-03-07 LAB — BLOOD GAS, ARTERIAL
Acid-Base Excess: 0.7 mmol/L (ref 0.0–2.0)
Bicarbonate: 25.1 mmol/L (ref 20.0–28.0)
FIO2: 21
O2 Saturation: 97 %
Patient temperature: 37
pCO2 arterial: 39.9 mmHg (ref 32.0–48.0)
pH, Arterial: 7.41 (ref 7.350–7.450)
pO2, Arterial: 83.5 mmHg (ref 83.0–108.0)

## 2021-03-07 LAB — CBG MONITORING, ED
Glucose-Capillary: 180 mg/dL — ABNORMAL HIGH (ref 70–99)
Glucose-Capillary: 190 mg/dL — ABNORMAL HIGH (ref 70–99)
Glucose-Capillary: 169 mg/dL — ABNORMAL HIGH (ref 70–99)
Glucose-Capillary: 88 mg/dL (ref 70–99)

## 2021-03-07 LAB — LACTIC ACID, PLASMA: Lactic Acid, Venous: 1.5 mmol/L (ref 0.5–1.9)

## 2021-03-07 LAB — APTT: aPTT: 31 seconds (ref 24–36)

## 2021-03-07 MED ORDER — REMDESIVIR 100 MG IV SOLR
100.0000 mg | INTRAVENOUS | Status: AC
  Administered 2021-03-07 (×2): 100 mg via INTRAVENOUS
  Filled 2021-03-07 (×2): qty 20

## 2021-03-07 MED ORDER — FUROSEMIDE 10 MG/ML IJ SOLN
20.0000 mg | Freq: Two times a day (BID) | INTRAMUSCULAR | Status: DC
  Administered 2021-03-07 – 2021-03-09 (×5): 20 mg via INTRAVENOUS
  Filled 2021-03-07 (×5): qty 2

## 2021-03-07 MED ORDER — MODAFINIL 100 MG PO TABS
200.0000 mg | ORAL_TABLET | Freq: Every day | ORAL | Status: DC
  Administered 2021-03-07 – 2021-03-09 (×3): 200 mg via ORAL
  Filled 2021-03-07 (×3): qty 2

## 2021-03-07 MED ORDER — BUDESONIDE 0.25 MG/2ML IN SUSP: 0.2500 mg | Freq: Two times a day (BID) | RESPIRATORY_TRACT | Status: DC

## 2021-03-07 MED ORDER — ASCORBIC ACID 500 MG PO TABS
500.0000 mg | ORAL_TABLET | Freq: Every day | ORAL | Status: DC
  Administered 2021-03-07 – 2021-03-10 (×4): 500 mg via ORAL
  Filled 2021-03-07 (×4): qty 1

## 2021-03-07 MED ORDER — MAGNESIUM OXIDE -MG SUPPLEMENT 400 (240 MG) MG PO TABS
400.0000 mg | ORAL_TABLET | Freq: Two times a day (BID) | ORAL | Status: DC
  Administered 2021-03-07 – 2021-03-10 (×7): 400 mg via ORAL
  Filled 2021-03-07 (×7): qty 1

## 2021-03-07 MED ORDER — SODIUM CHLORIDE 0.9 % IV SOLN
100.0000 mg | Freq: Every day | INTRAVENOUS | Status: DC
  Administered 2021-03-08 – 2021-03-10 (×3): 100 mg via INTRAVENOUS
  Filled 2021-03-07 (×3): qty 20

## 2021-03-07 MED ORDER — ENOXAPARIN SODIUM 40 MG/0.4ML IJ SOSY
40.0000 mg | PREFILLED_SYRINGE | INTRAMUSCULAR | Status: DC
  Administered 2021-03-07 – 2021-03-10 (×4): 40 mg via SUBCUTANEOUS
  Filled 2021-03-07 (×4): qty 0.4

## 2021-03-07 MED ORDER — CARVEDILOL 3.125 MG PO TABS
6.2500 mg | ORAL_TABLET | Freq: Two times a day (BID) | ORAL | Status: DC
  Administered 2021-03-07 – 2021-03-10 (×6): 6.25 mg via ORAL
  Filled 2021-03-07 (×6): qty 2

## 2021-03-07 MED ORDER — INSULIN GLARGINE-YFGN 100 UNIT/ML ~~LOC~~ SOLN
8.0000 [IU] | Freq: Every day | SUBCUTANEOUS | Status: DC
  Administered 2021-03-07 – 2021-03-09 (×3): 8 [IU] via SUBCUTANEOUS
  Filled 2021-03-07 (×5): qty 0.08

## 2021-03-07 MED ORDER — ATORVASTATIN CALCIUM 20 MG PO TABS
20.0000 mg | ORAL_TABLET | Freq: Every day | ORAL | Status: DC
  Administered 2021-03-07 – 2021-03-10 (×4): 20 mg via ORAL
  Filled 2021-03-07 (×2): qty 1
  Filled 2021-03-07: qty 2
  Filled 2021-03-07: qty 1

## 2021-03-07 MED ORDER — INSULIN ASPART 100 UNIT/ML IJ SOLN
0.0000 [IU] | Freq: Three times a day (TID) | INTRAMUSCULAR | Status: DC
  Administered 2021-03-07 (×2): 3 [IU] via SUBCUTANEOUS
  Administered 2021-03-08: 2 [IU] via SUBCUTANEOUS
  Administered 2021-03-09 (×3): 3 [IU] via SUBCUTANEOUS
  Administered 2021-03-10: 1 [IU] via SUBCUTANEOUS
  Administered 2021-03-10: 3 [IU] via SUBCUTANEOUS
  Filled 2021-03-07 (×2): qty 1

## 2021-03-07 MED ORDER — POLYETHYLENE GLYCOL 3350 17 G PO PACK
17.0000 g | PACK | Freq: Two times a day (BID) | ORAL | Status: DC
  Administered 2021-03-07 – 2021-03-10 (×7): 17 g via ORAL
  Filled 2021-03-07 (×7): qty 1

## 2021-03-07 MED ORDER — SODIUM CHLORIDE 0.9 % IV SOLN: INTRAVENOUS | Status: DC | PRN

## 2021-03-07 MED ORDER — ASPIRIN EC 81 MG PO TBEC
81.0000 mg | DELAYED_RELEASE_TABLET | Freq: Every day | ORAL | Status: DC
  Administered 2021-03-07 – 2021-03-10 (×4): 81 mg via ORAL
  Filled 2021-03-07 (×4): qty 1

## 2021-03-07 MED ORDER — HYDRALAZINE HCL 25 MG PO TABS
25.0000 mg | ORAL_TABLET | Freq: Three times a day (TID) | ORAL | Status: DC
  Administered 2021-03-07 – 2021-03-10 (×8): 25 mg via ORAL
  Filled 2021-03-07 (×10): qty 1

## 2021-03-07 MED ORDER — SODIUM CHLORIDE 0.9 % IV SOLN
1.0000 g | Freq: Three times a day (TID) | INTRAVENOUS | Status: DC
  Administered 2021-03-07 – 2021-03-10 (×9): 1 g via INTRAVENOUS
  Filled 2021-03-07 (×9): qty 1

## 2021-03-07 MED ORDER — BUDESONIDE 180 MCG/ACT IN AEPB
1.0000 | INHALATION_SPRAY | Freq: Two times a day (BID) | RESPIRATORY_TRACT | Status: DC
  Administered 2021-03-07 – 2021-03-10 (×6): 1 via RESPIRATORY_TRACT
  Filled 2021-03-07: qty 1

## 2021-03-07 MED ORDER — AMLODIPINE BESYLATE 5 MG PO TABS
10.0000 mg | ORAL_TABLET | Freq: Every day | ORAL | Status: DC
  Administered 2021-03-07 – 2021-03-10 (×4): 10 mg via ORAL
  Filled 2021-03-07 (×4): qty 2

## 2021-03-07 MED ORDER — INSULIN ASPART 100 UNIT/ML IJ SOLN: 0.0000 [IU] | Freq: Every day | INTRAMUSCULAR | Status: DC

## 2021-03-07 MED ORDER — GUAIFENESIN-DM 100-10 MG/5ML PO SYRP: 10.0000 mL | ORAL_SOLUTION | ORAL | Status: DC | PRN

## 2021-03-07 MED ORDER — PANTOPRAZOLE SODIUM 40 MG IV SOLR
40.0000 mg | Freq: Every day | INTRAVENOUS | Status: DC
  Administered 2021-03-07 – 2021-03-09 (×3): 40 mg via INTRAVENOUS
  Filled 2021-03-07 (×3): qty 40

## 2021-03-07 MED ORDER — ALBUTEROL SULFATE HFA 108 (90 BASE) MCG/ACT IN AERS
2.0000 | INHALATION_SPRAY | Freq: Two times a day (BID) | RESPIRATORY_TRACT | Status: DC
  Administered 2021-03-07 – 2021-03-10 (×6): 2 via RESPIRATORY_TRACT
  Filled 2021-03-07: qty 6.7

## 2021-03-07 MED ORDER — CLOPIDOGREL BISULFATE 75 MG PO TABS
75.0000 mg | ORAL_TABLET | Freq: Every day | ORAL | Status: DC
  Administered 2021-03-08 – 2021-03-10 (×3): 75 mg via ORAL
  Filled 2021-03-07 (×3): qty 1

## 2021-03-07 MED ORDER — MAGNESIUM OXIDE 400 MG PO TABS
400.0000 mg | ORAL_TABLET | Freq: Two times a day (BID) | ORAL | Status: DC
  Filled 2021-03-07 (×5): qty 1

## 2021-03-07 MED ORDER — VANCOMYCIN HCL 1250 MG/250ML IV SOLN
1250.0000 mg | INTRAVENOUS | Status: DC
  Administered 2021-03-07 (×2): 1250 mg via INTRAVENOUS
  Filled 2021-03-07 (×2): qty 250

## 2021-03-07 MED ORDER — ACETAMINOPHEN 325 MG PO TABS
650.0000 mg | ORAL_TABLET | Freq: Four times a day (QID) | ORAL | Status: DC | PRN
  Administered 2021-03-08 – 2021-03-09 (×2): 650 mg via ORAL
  Filled 2021-03-07 (×2): qty 2

## 2021-03-07 MED ORDER — ZINC SULFATE 220 (50 ZN) MG PO CAPS
220.0000 mg | ORAL_CAPSULE | Freq: Every day | ORAL | Status: DC
  Administered 2021-03-07 – 2021-03-10 (×4): 220 mg via ORAL
  Filled 2021-03-07 (×4): qty 1

## 2021-03-07 MED ORDER — ALBUTEROL SULFATE HFA 108 (90 BASE) MCG/ACT IN AERS
2.0000 | INHALATION_SPRAY | Freq: Four times a day (QID) | RESPIRATORY_TRACT | Status: DC
  Administered 2021-03-07: 2 via RESPIRATORY_TRACT
  Filled 2021-03-07: qty 6.7

## 2021-03-07 NOTE — ED Notes (Signed)
Unable to get second iv at this time.

## 2021-03-07 NOTE — Progress Notes (Signed)
Patient admitted to the hospital earlier this morning by Dr. Josephine Cables  Patient seen and examined.  She appears to be persistently lethargic.  Briefly opens up her eyes to voice.  She knows that she is in the hospital, but falls asleep when asked further questions.  Lungs are clear bilaterally.  Abdomen appears benign.  She has discoloration of her toes bilaterally which appears to be chronic.  She has had prior amputations of toes of left foot.  Assessment/plan:  Acute metabolic encephalopathy -Suspect this may be related to acute illness/fever -CT head unremarkable -Urinalysis indicates possible UTI -Check ammonia and ABG -Continue to monitor mental status  COVID-19 virus infection -She is currently on room air and does not appear to be hypoxic -She does have bilateral pleural effusions -She has been started on remdesivir -Hold off on steroids for now unless she develops hypoxia -Follow inflammatory markers  Acute on chronic diastolic congestive heart failure -Noted to have bilateral pleural effusion as well as pericardial effusion -She has been started on intravenous Lasix -Noted to have canister full of urine in her room -She does have some 1+ pitting lower extremity edema -Continue current treatments with diuresis -We will order echocardiogram to reevaluate pericardial effusion  Elevated troponin -Overall trend has been relatively flat -Suspect this is demand ischemia in the setting of acute illness and volume overload  Hypertension -Stable -Continue on amlodipine, Coreg and hydralazine  Hyperlipidemia -Continue statin  COPD -No wheezing at this time -Continue on Pulmicort  Peripheral arterial disease -Recent revascularization on 01/23/2021 -Continue aspirin, Plavix and statin  Insulin-dependent diabetes -Continued on basal insulin as well as sliding scale -Continue to follow blood sugars  Possible UTI -Prior history of ESBL -Continue on Merrem for now until  culture results are available  Updated husband over the phone  [employer]

## 2021-03-07 NOTE — Progress Notes (Signed)
Pharmacy Antibiotic Note  [name] is a 80 y.o. female admitted on 03/06/2021 with sepsis.  Pharmacy has been consulted for Vancomycin/Merrem dosing. Pt with altered mental status and fever (Tmax 101) for a few days. Pt has history of ESBL E Coli UTI.   Plan: Vancomycin 1250 mg IV q24h >>Estimated AUC: 521 Merrem 1g IV q8h Trend WBC, temp, renal function  F/U infectious work-up Drug levels as indicated   Height: '5\' 8"'$  (172.7 cm) Weight: 63 kg (138 lb 14.2 oz) IBW/kg (Calculated) : 63.9  Temp (24hrs), Avg:101 F (38.3 C), Min:101 F (38.3 C), Max:101 F (38.3 C)  Recent Labs  Lab 03/06/21 2238  WBC 6.4  CREATININE 0.61  LATICACIDVEN 1.2    Estimated Creatinine Clearance: 57.6 mL/min (by C-G formula based on SCr of 0.61 mg/dL).    Allergies  Allergen Reactions   Iodine Anaphylaxis   Penicillins Anaphylaxis    Tolerates ceftriaxone, cefazolin Did it involve swelling of the face/tongue/throat, SOB, or low BP? Unknown Did it involve sudden or severe rash/hives, skin peeling, or any reaction on the inside of your mouth or nose? Unknown Did you need to seek medical attention at a hospital or doctor's office? Unknown When did it last happen?      Unknown If all above answers are "NO", may proceed with cephalosporin use.    Shellfish Allergy Anaphylaxis   Sulfa Antibiotics Anaphylaxis   Sulfacetamide Sodium Anaphylaxis   Sulfasalazine Anaphylaxis   Eggs Or Egg-Derived Products     Other reaction(s): SHORTNESS OF BREATH   Morphine And Related Other (See Comments)    Altered mental status    Narda Bonds, PharmD, BCPS Clinical Pharmacist Phone: [phone]

## 2021-03-07 NOTE — Sepsis Progress Note (Signed)
Elink following for Code Sepsis  

## 2021-03-07 NOTE — H&P (Addendum)
History and Physical  [name] [id number] DOB: 1941/05/12 DOA: 03/06/2021  Referring physician:  Ezequiel Essex, MD PCP: [name], MD  Patient coming from: Home  Chief Complaint: Fever  HPI: [name] is a 80 y.o. female with medical history significant for peripheral artery disease on aspirin/Plavix and Lipitor, type 2 diabetes, essential hypertension, hyperlipidemia, chronic diastolic CHF who presents to the emergency department via EMS from home due to fever.  Patient was unable to provide history, history was obtained from the ED physician, ED medical record and was husband by phone.  Per report, patient has been having subjective fever within last 2 days and has also been less talkative and generally not acting like herself.  Patient has an aide that usually come to assist her and usually leave by 5pm, when leaving today, she told the husband that patient has only taken a few bites of her meals since morning.  Patient's husband tried to encourage her to eat, but since she was uncooperative, he activated EMS.  On arrival of EMS, temperature was checked and it was 101F, so it was decided for patient to be taken to the ED for further evaluation and management.  ED Course:  In the emergency department, she was febrile with a temperature of 101F, BP was 118/70 and other vital signs were within normal range.  Work-up in the ED showed normocytic anemia, hyperglycemia, lactic acid x2 was negative, troponin x2-68 > 91.  Urinalysis was positive for large leukocytes and > 50 WBC.  Influenza A, B was negative, SARS coronavirus 2 was positive. CT of head without contrast showed no acute intracranial abnormality CT abdomen and pelvis without contrast showed small bilateral pleural effusions, left greater than right and moderate pericardial effusion along the posterior aspect of the left ventricle.  This may be loculated.  Cholelithiasis without cholecystitis noted. Chest x-ray showed  moderate severity left basilar atelectasis and/or infiltrate and small left pleural effusion. Patient was empirically started on IV antibiotics (vancomycin and Merrem).  IV remdesivir was started. Hospitalist was asked to admit patient for further evaluation and management.  Review of Systems: This cannot be obtained at this time due to patient's current condition  Past Medical History:  Diagnosis Date   Acute heart failure ([place])    Aortic stenosis    CHF (congestive heart failure) (HCC)    Complication of anesthesia    Hard to wake patient up after having anesthesia   Diabetes mellitus without complication ([place])    Diabetic neuropathy ([place])    Takes Lyrica   Edema of both legs    Takes Lasix   GERD (gastroesophageal reflux disease)    High cholesterol    HTN (hypertension)    Hypertension    PAD (peripheral artery disease) (HCC)    Shortness of breath dyspnea    Spasm of back muscles    Stroke (HCC)    Wound, open    Right great toe   Past Surgical History:  Procedure Laterality Date   AMPUTATION TOE Left 06/09/2019   Procedure: Left third toe and partial great toe amputation and debridement;  Surgeon: Katha Cabal, MD;  Location: [facility];  Service: Vascular;  Laterality: Left;   AMPUTATION TOE Left 04/06/2020   Procedure: AMPUTATION LEFT SECOND TOE;  Surgeon: Samara Deist, DPM;  Location: [facility];  Service: Podiatry;  Laterality: Left;   BYPASS GRAFT POPLITEAL TO TIBIAL Right 02/28/2016   Procedure: BYPASS GRAFT RIGHT BELOW KNEE POPLITEAL TO PERONEAL  USING REVERSED RIGHT GREATER SAPHENOUS VEIN;  Surgeon: Conrad Edgewood, MD;  Location: [place];  Service: Vascular;  Laterality: Right;   CYST EXCISION     Abdomen   EYE SURGERY Bilateral    Cataract removal   INTRAMEDULLARY (IM) NAIL INTERTROCHANTERIC Left 02/04/2014   Procedure: INTRAMEDULLARY (IM) NAIL INTERTROCHANTRIC FEMORAL;  Surgeon: Mauri Pole, MD;  Location: [place];  Service: Orthopedics;  Laterality: Left;   IR  GENERIC HISTORICAL  03/01/2016   IR ANGIO INTRA EXTRACRAN SEL COM CAROTID INNOMINATE UNI R MOD SED 03/01/2016 [name], MD [facility]   IR GENERIC HISTORICAL  03/01/2016   IR ENDOVASC INTRACRANIAL INF OTHER THAN THROMBO ART INC DIAG ANGIO 03/01/2016 [name], MD [facility]   IR GENERIC HISTORICAL  03/01/2016   IR INTRAVSC STENT CERV CAROTID W/O EMB-PROT MOD SED INC ANGIO 03/01/2016 [name], MD [facility]   IR GENERIC HISTORICAL  06/03/2016   IR RADIOLOGIST EVAL & MGMT 06/03/2016 [facility]   LOWER EXTREMITY ANGIOGRAPHY Left 02/09/2019   Procedure: LOWER EXTREMITY ANGIOGRAPHY;  Surgeon: Katha Cabal, MD;  Location: [place] [facility];  Service: Cardiovascular;  Laterality: Left;   LOWER EXTREMITY ANGIOGRAPHY Left 03/10/2019   Procedure: LOWER EXTREMITY ANGIOGRAPHY;  Surgeon: Katha Cabal, MD;  Location: [place] [facility];  Service: Cardiovascular;  Laterality: Left;   LOWER EXTREMITY ANGIOGRAPHY Left 04/27/2019   Procedure: LOWER EXTREMITY ANGIOGRAPHY;  Surgeon: Katha Cabal, MD;  Location: [place] [facility];  Service: Cardiovascular;  Laterality: Left;   LOWER EXTREMITY ANGIOGRAPHY Left 06/08/2019   Procedure: Lower Extremity Angiography;  Surgeon: Katha Cabal, MD;  Location: [place] [facility];  Service: Cardiovascular;  Laterality: Left;   LOWER EXTREMITY ANGIOGRAPHY Left 04/03/2020   Procedure: Lower Extremity Angiography;  Surgeon: Algernon Huxley, MD;  Location: [place] [facility];  Service: Cardiovascular;  Laterality: Left;   LOWER EXTREMITY ANGIOGRAPHY Right 10/20/2020   Procedure: Lower Extremity Angiography;  Surgeon: Katha Cabal, MD;  Location: [place] [facility];  Service: Cardiovascular;  Laterality: Right;   LOWER EXTREMITY ANGIOGRAPHY Left 01/23/2021   Procedure: LOWER EXTREMITY ANGIOGRAPHY;  Surgeon: Katha Cabal, MD;  Location: [place] [facility];  Service: Cardiovascular;  Laterality: Left;   ORIF TOE  FRACTURE Right 02/08/2014   Procedure: OPEN REDUCTION INTERNAL FIXATION Right METATARSAL  FRACTURE ;  Surgeon: Wylene Simmer, MD;  Location: [place];  Service: Orthopedics;  Laterality: Right;   PERIPHERAL VASCULAR CATHETERIZATION N/A 12/28/2015   Procedure: Abdominal Aortogram w/Lower Extremity;  Surgeon: Conrad Montour, MD;  Location: [place] [facility];  Service: Cardiovascular;  Laterality: N/A;   RADIOLOGY WITH ANESTHESIA N/A 03/01/2016   Procedure: RADIOLOGY WITH ANESTHESIA;  Surgeon: [name], MD;  Location: [place];  Service: Radiology;  Laterality: N/A;   VEIN HARVEST Right 02/28/2016   Procedure: RIGHT GREATER SAPHENOUS VEIN HARVEST;  Surgeon: Conrad Ottawa, MD;  Location: [place];  Service: Vascular;  Laterality: Right;    Social History:  reports that she has never smoked. She has never used smokeless tobacco. She reports that she does not drink alcohol and does not use drugs.   Allergies  Allergen Reactions   Iodine Anaphylaxis   Penicillins Anaphylaxis    Tolerates ceftriaxone, cefazolin Did it involve swelling of the face/tongue/throat, SOB, or low BP? Unknown Did it involve sudden or severe rash/hives, skin peeling, or any reaction on the inside of your mouth or nose? Unknown Did you need to  seek medical attention at a hospital or doctor's office? Unknown When did it last happen?      Unknown If all above answers are "NO", may proceed with cephalosporin use.    Shellfish Allergy Anaphylaxis   Sulfa Antibiotics Anaphylaxis   Sulfacetamide Sodium Anaphylaxis   Sulfasalazine Anaphylaxis   Eggs Or Egg-Derived Products     Other reaction(s): SHORTNESS OF BREATH   Morphine And Related Other (See Comments)    Altered mental status    Family History  Problem Relation Age of Onset   Diabetes Other    Liver disease Mother    CVA Father    Diabetes Father    Hypertension Father     Prior to Admission medications   Medication Sig Start Date End Date Taking? Authorizing  Provider  acetaminophen (TYLENOL) 500 MG tablet Take 1,000 mg by mouth every 6 (six) hours as needed for mild pain.    [provider]  amLODipine (NORVASC) 10 MG tablet Take 10 mg by mouth daily.    [provider]  aspirin EC 81 MG EC tablet Take 1 tablet (81 mg total) by mouth daily. 04/29/19   Stegmayer, Joelene Millin A, PA-C  atorvastatin (LIPITOR) 20 MG tablet Take 1 tablet (20 mg total) by mouth daily. Patient taking differently: Take 20 mg by mouth at bedtime. 03/13/16   Virgina Jock A, PA-C  budesonide (PULMICORT) 0.25 MG/2ML nebulizer solution Take 2 mLs (0.25 mg total) by nebulization 2 (two) times daily. 06/10/18   Dustin Flock, MD  carvedilol (COREG) 6.25 MG tablet Take 6.25 mg by mouth 2 (two) times daily with a meal.    [provider]  cetirizine (ZYRTEC) 10 MG tablet Take 10 mg by mouth daily.    [provider]  cholecalciferol (VITAMIN D3) 25 MCG (1000 UNIT) tablet Take 1,000 Units by mouth daily.    [provider]  ciclopirox (PENLAC) 8 % solution Apply 1 application topically daily. 12/05/20   [provider]  clopidogrel (PLAVIX) 75 MG tablet Take 1 tablet (75 mg total) by mouth daily with breakfast. 03/04/17   Conrad Dry Prong, MD  diclofenac Sodium (VOLTAREN) 1 % GEL Apply 1 application topically 4 (four) times daily as needed (pain).    [provider]  docusate sodium (COLACE) 100 MG capsule Take 100 mg by mouth daily.    [provider]  fluticasone (FLONASE) 50 MCG/ACT nasal spray Place 1 spray into both nostrils daily as needed for allergies.     [provider]  gabapentin (NEURONTIN) 300 MG capsule Take 300 mg by mouth at bedtime.    [provider]  hydrALAZINE (APRESOLINE) 25 MG tablet Take 1 tablet (25 mg total) by mouth every 8 (eight) hours. Patient taking differently: Take 25 mg by mouth in the morning and at bedtime. 12/28/18   Fritzi Mandes, MD  insulin glargine (LANTUS) 100 UNIT/ML  injection Inject 0.15 mLs (15 Units total) into the skin at bedtime. 12/28/18   Fritzi Mandes, MD  magnesium oxide (MAG-OX) 400 MG tablet Take 400 mg by mouth 2 (two) times daily.     [provider]  modafinil (PROVIGIL) 200 MG tablet Take 200 mg by mouth daily.    [provider]  omeprazole (PRILOSEC) 20 MG capsule Take 1 capsule (20 mg total) by mouth daily. 04/17/18   Henreitta Leber, MD  polyethylene glycol (MIRALAX / GLYCOLAX) packet Take 17 g by mouth 2 (two) times daily. Patient not taking: No sig reported  02/09/14   Thurnell Lose, MD  potassium chloride SA (K-DUR,KLOR-CON) 20 MEQ tablet Take 20 mEq by mouth daily.    [provider]  torsemide (DEMADEX) 20 MG tablet Take 1 tablet (20 mg total) by mouth 2 (two) times daily. 12/28/18   Fritzi Mandes, MD  Vitamin E 180 MG CAPS Take 180 mg by mouth daily.    [provider]    Physical Exam: BP (!) 177/54   Pulse 73   Temp 99.3 F (37.4 C) (Oral)   Resp 15   Ht '5\' 8"'$  (1.727 m)   Wt 63 kg   SpO2 91%   BMI 21.12 kg/m   General: 80 y.o. year-old female well developed ill-appearing, but in no acute distress.   HEENT: Dry mucous membrane.  NCAT, EOMI Neck: Supple, trachea medial Cardiovascular: Regular rate and rhythm with no rubs or gallops.  No thyromegaly or JVD noted.  No lower extremity edema. 2/4 pulses in all 4 extremities. Respiratory: Clear to auscultation with no wheezes or rales. Good inspiratory effort. Abdomen: Soft, nontender nondistended with normal bowel sounds x4 quadrants. Muskuloskeletal: Bilateral necrotic toes noted with prior 2 toe amputations on the left Neuro: CN II-XII intact, sensation, reflexes intact Skin: Bilateral necrotic toes noted Psychiatry: Mood is appropriate for condition and setting          Labs on Admission:  Basic Metabolic Panel: Recent Labs  Lab 03/06/21 2238  NA 136  K 4.2  CL 105  CO2 24  GLUCOSE 192*  BUN 15  CREATININE 0.61  CALCIUM 8.8*    Liver Function Tests: Recent Labs  Lab 03/06/21 2238  AST 28  ALT 23  ALKPHOS 58  BILITOT 0.5  PROT 6.5  ALBUMIN 3.1*   No results for input(s): LIPASE, AMYLASE in the last 168 hours. No results for input(s): AMMONIA in the last 168 hours. CBC: Recent Labs  Lab 03/06/21 2238  WBC 6.4  NEUTROABS 4.2  HGB 10.5*  HCT 33.7*  MCV 83.0  PLT 179   Cardiac Enzymes: No results for input(s): CKTOTAL, CKMB, CKMBINDEX, TROPONINI in the last 168 hours.  BNP (last 3 results) Recent Labs    04/01/20 0615  BNP 135.5*    ProBNP (last 3 results) No results for input(s): PROBNP in the last 8760 hours.  CBG: No results for input(s): GLUCAP in the last 168 hours.  Radiological Exams on Admission: CT ABDOMEN PELVIS WO CONTRAST  Result Date: 03/06/2021 CLINICAL DATA:  Sepsis, febrile, unresponsive EXAM: CT ABDOMEN AND PELVIS WITHOUT CONTRAST TECHNIQUE: Multidetector CT imaging of the abdomen and pelvis was performed following the standard protocol without IV contrast. COMPARISON:  03/03/2016, 03/06/2021 FINDINGS: Lower chest: There are small bilateral pleural effusions, left greater than right. There is a left pericardial effusion measuring up to 2.9 cm in thickness along the posterior wall of the left ventricle. This is increased since prior study. Dense calcification of the aortic and mitral valves. Hepatobiliary: Calcified gallstone is identified without cholecystitis. Unenhanced imaging of the liver demonstrates no focal abnormalities. Pancreas: Unremarkable. No pancreatic ductal dilatation or surrounding inflammatory changes. Spleen: Normal in size without focal abnormality. Adrenals/Urinary Tract: There are bilateral nonobstructing renal calculi, measuring 4 mm on the left and 2 mm on the right. Bladder is unremarkable. Stable nodularity of the left adrenal gland consistent with adenoma. The right adrenals unremarkable. Stomach/Bowel: No bowel obstruction or ileus. Moderate retained  stool within the distal colon. There is distal colonic diverticulosis without diverticulitis. Normal appendix right  lower quadrant. No bowel wall thickening or inflammatory change. Vascular/Lymphatic: Aortic atherosclerosis. No enlarged abdominal or pelvic lymph nodes. Reproductive: Uterus and bilateral adnexa are unremarkable. Calcified uterine fibroid again noted. Other: No free fluid or free intraperitoneal gas. Fat containing left inguinal hernia. No bowel herniation. Musculoskeletal: No acute or destructive bony lesions. Postsurgical changes proximal left femur. Reconstructed images demonstrate no additional findings. IMPRESSION: 1. Small bilateral pleural effusions, left greater than right. 2. Moderate pericardial effusion along the posterior aspect of the left ventricle. This may be loculated. 3. Cholelithiasis without cholecystitis. 4. Bilateral nonobstructing renal calculi. 5. Distal colonic diverticulosis without diverticulitis. 6.  Aortic Atherosclerosis (ICD10-I70.0). Electronically Signed   By: Randa Ngo M.D.   On: 03/06/2021 23:47   CT HEAD WO CONTRAST (5MM)  Result Date: 03/06/2021 CLINICAL DATA:  Delirium EXAM: CT HEAD WITHOUT CONTRAST TECHNIQUE: Contiguous axial images were obtained from the base of the skull through the vertex without intravenous contrast. COMPARISON:  Head CT 10/19/2020 FINDINGS: Brain: There is no mass, hemorrhage or extra-axial collection. There is generalized atrophy without lobar predilection. Hypodensity of the white matter is most commonly associated with chronic microvascular disease. Unchanged calcified mass in the right posterior fossa likely a meningioma. Unchanged focus of encephalomalacia in the right parietal lobe. Vascular: Atherosclerotic calcification of the internal carotid arteries at the skull base. No abnormal hyperdensity of the major intracranial arteries or dural venous sinuses. Skull: The visualized skull base, calvarium and extracranial soft  tissues are normal. Sinuses/Orbits: No fluid levels or advanced mucosal thickening of the visualized paranasal sinuses. No mastoid or middle ear effusion. The orbits are normal. IMPRESSION: 1. No acute intracranial abnormality. 2. Unchanged right posterior fossa meningioma. 3. Chronic microvascular ischemia and generalized atrophy. Electronically Signed   By: Ulyses Jarred M.D.   On: 03/06/2021 23:56   DG Chest Portable 1 View  Result Date: 03/06/2021 CLINICAL DATA:  Fever. EXAM: PORTABLE CHEST 1 VIEW COMPARISON:  March 30, 2020 FINDINGS: Moderate severity left basilar atelectasis and/or infiltrate is seen. There is a small left pleural effusion. No pneumothorax is identified. The cardiac silhouette is normal in size. Right-sided tracheal deviation is seen. A radiopaque vascular stent is noted within the neck soft tissues on the left. Degenerative changes are seen throughout the thoracic spine. IMPRESSION: 1. Moderate severity left basilar atelectasis and/or infiltrate. 2. Small left pleural effusion. Electronically Signed   By: Virgina Norfolk M.D.   On: 03/06/2021 22:50    EKG: I independently viewed the EKG done and my findings are as followed: Normal sinus rhythm at a rate of 77 bpm  Assessment/Plan Present on Admission:  COVID-19 virus infection  HLD (hyperlipidemia)  HTN (hypertension)  Chronic diastolic CHF (congestive heart failure) (HCC)  GERD (gastroesophageal reflux disease)  COPD (chronic obstructive pulmonary disease) (HCC)  Principal Problem:   COVID-19 virus infection Active Problems:   HTN (hypertension)   HLD (hyperlipidemia)   Chronic diastolic CHF (congestive heart failure) (HCC)   Peripheral arterial disease (HCC)   COPD (chronic obstructive pulmonary disease) (HCC)   GERD (gastroesophageal reflux disease)   Acute febrile illness   Hypoalbuminemia   Hyperglycemia due to diabetes mellitus (HCC)   Elevated troponin   Constipation  COVID-19 virus infection SARS  coronavirus 2 was positive She was not hypoxic Continue albuterol q.6h Continue IV Remdesivir per pharmacy protocol Continue vitamin-C 500 mg p.o. Daily Continue zinc 220 mg p.o. Daily Continue Mucinex, Robitussin  Continue Tylenol p.r.n. for fever Continue incentive spirometry and flutter valve q76mn  as tolerated Continue airborne isolation precaution Continue monitoring daily inflammatory markers  Acute febrile illness Patient was febrile with a temperature of 101F Empiric antibiotic with IV vancomycin and meropenem was started, we shall continue with same at this time Lactic acid was negative.  Procalcitonin will be checked  Bilateral pleural effusion CT abdomen and pelvis without contrast showed small bilateral pleural effusions, left greater than right This appears to little to be tapped via thoracentesis IV Lasix 20 mg twice daily will be attempted  Elevated troponin possibly secondary to type II demand ischemia Troponin x2- 68 >91, currently no chest pain EKG personally reviewed showed normal sinus rhythm at rate of 77 bpm Continue to trend troponin  Hypoalbuminemia possibly secondary to mild protein calorie malnutrition Albumin 3.4, continue to monitor out pulmonary  Hyperglycemia secondary to T2DM Continue ISS and hypoglycemic protocol Continue Lantus 8 units nightly (home dose 15 units) due to decreased oral intake at this time, adjust dose as tolerated  Hypertension (uncontrolled) Continue amlodipine, Coreg, hydralazine  Hyperlipidemia Continue Lipitor  Chronic diastolic CHF Continue Coreg, Lipitor  Peripheral arterial disease Continue Plavix, Lipitor  Constipation Continue MiraLAX and magnesium oxide  COPD Continue Pulmicort    DVT prophylaxis: Lovenox  Code Status: Full code  Family Communication: Husband by phone (all questions answered to satisfaction)  Disposition Plan:  Patient is from:                        home Anticipated DC to:                    SNF or family members home Anticipated DC date:               2-3 days Anticipated DC barriers:          Patient requires inpatient management due to COVID-19 virus and acute febrile illness  Consults called: None  Admission status: Inpatient    Bernadette Hoit MD [facility]  03/07/2021, 4:40 AM

## 2021-03-07 NOTE — ED Notes (Signed)
Pt arrived covered in urine an feces. Pt bathed, and purwick placed at this time

## 2021-03-07 NOTE — ED Notes (Signed)
Pt given breakfast tray at bedside.

## 2021-03-07 NOTE — ED Notes (Signed)
purewick placed

## 2021-03-08 ENCOUNTER — Inpatient Hospital Stay (HOSPITAL_COMMUNITY): Payer: Medicare HMO

## 2021-03-08 DIAGNOSIS — I5033 Acute on chronic diastolic (congestive) heart failure: Secondary | ICD-10-CM

## 2021-03-08 DIAGNOSIS — I5032 Chronic diastolic (congestive) heart failure: Secondary | ICD-10-CM

## 2021-03-08 DIAGNOSIS — J449 Chronic obstructive pulmonary disease, unspecified: Secondary | ICD-10-CM

## 2021-03-08 DIAGNOSIS — I248 Other forms of acute ischemic heart disease: Secondary | ICD-10-CM | POA: Diagnosis not present

## 2021-03-08 DIAGNOSIS — R778 Other specified abnormalities of plasma proteins: Secondary | ICD-10-CM

## 2021-03-08 LAB — COMPREHENSIVE METABOLIC PANEL
ALT: 17 U/L (ref 0–44)
AST: 21 U/L (ref 15–41)
Albumin: 2.7 g/dL — ABNORMAL LOW (ref 3.5–5.0)
Alkaline Phosphatase: 55 U/L (ref 38–126)
Anion gap: 10 (ref 5–15)
BUN: 15 mg/dL (ref 8–23)
CO2: 23 mmol/L (ref 22–32)
Calcium: 8.7 mg/dL — ABNORMAL LOW (ref 8.9–10.3)
Chloride: 104 mmol/L (ref 98–111)
Creatinine, Ser: 0.52 mg/dL (ref 0.44–1.00)
GFR, Estimated: >60 mL/min (ref >60)
Glucose, Bld: 134 mg/dL — ABNORMAL HIGH (ref 70–99)
Potassium: 3.5 mmol/L (ref 3.5–5.1)
Sodium: 137 mmol/L (ref 135–145)
Total Bilirubin: 0.8 mg/dL (ref 0.3–1.2)
Total Protein: 5.8 g/dL — ABNORMAL LOW (ref 6.5–8.1)

## 2021-03-08 LAB — CBC WITH DIFFERENTIAL/PLATELET
Abs Immature Granulocytes: 0.04 10*3/uL (ref 0.00–0.07)
Basophils Absolute: 0 10*3/uL (ref 0.0–0.1)
Basophils Relative: 0 %
Eosinophils Absolute: 0 10*3/uL (ref 0.0–0.5)
Eosinophils Relative: 0 %
HCT: 35.2 % — ABNORMAL LOW (ref 36.0–46.0)
Hemoglobin: 10.8 g/dL — ABNORMAL LOW (ref 12.0–15.0)
Immature Granulocytes: 0 %
Lymphocytes Relative: 13 %
Lymphs Abs: 1.2 10*3/uL (ref 0.7–4.0)
MCH: 25.9 pg — ABNORMAL LOW (ref 26.0–34.0)
MCHC: 30.7 g/dL (ref 30.0–36.0)
MCV: 84.4 fL (ref 80.0–100.0)
Monocytes Absolute: 0.7 10*3/uL (ref 0.1–1.0)
Monocytes Relative: 7 %
Neutro Abs: 7.6 10*3/uL (ref 1.7–7.7)
Neutrophils Relative %: 80 %
Platelets: 143 10*3/uL — ABNORMAL LOW (ref 150–400)
RBC: 4.17 MIL/uL (ref 3.87–5.11)
RDW: 15.4 % (ref 11.5–15.5)
WBC: 9.6 10*3/uL (ref 4.0–10.5)
nRBC: 0 % (ref 0.0–0.2)

## 2021-03-08 LAB — GLUCOSE, CAPILLARY
Glucose-Capillary: 139 mg/dL — ABNORMAL HIGH (ref 70–99)
Glucose-Capillary: 108 mg/dL — ABNORMAL HIGH (ref 70–99)
Glucose-Capillary: 109 mg/dL — ABNORMAL HIGH (ref 70–99)
Glucose-Capillary: 136 mg/dL — ABNORMAL HIGH (ref 70–99)
Glucose-Capillary: 195 mg/dL — ABNORMAL HIGH (ref 70–99)

## 2021-03-08 LAB — ECHOCARDIOGRAM COMPLETE
AR max vel: 1.25 cm2
AV Area VTI: 1.43 cm2
AV Area mean vel: 1.29 cm2
AV Mean grad: 9.5 mmHg
AV Peak grad: 20 mmHg
Ao pk vel: 2.24 m/s
Area-P 1/2: 2.28 cm2
Height: 68 in
MV VTI: 2.07 cm2
S' Lateral: 1.73 cm
Weight: 2313.95 oz

## 2021-03-08 LAB — PHOSPHORUS: Phosphorus: 3.1 mg/dL (ref 2.5–4.6)

## 2021-03-08 LAB — C-REACTIVE PROTEIN: CRP: 1.8 mg/dL — ABNORMAL HIGH (ref <1.0)

## 2021-03-08 LAB — MAGNESIUM: Magnesium: 2.1 mg/dL (ref 1.7–2.4)

## 2021-03-08 LAB — D-DIMER, QUANTITATIVE: D-Dimer, Quant: 0.72 ug/mL-FEU — ABNORMAL HIGH (ref 0.00–0.50)

## 2021-03-08 LAB — FERRITIN: Ferritin: 39 ng/mL (ref 11–307)

## 2021-03-08 MED ORDER — GLUCERNA SHAKE PO LIQD
237.0000 mL | Freq: Three times a day (TID) | ORAL | Status: DC
  Administered 2021-03-08 – 2021-03-10 (×5): 237 mL via ORAL

## 2021-03-08 NOTE — Progress Notes (Signed)
PROGRESS NOTE    [name]  [id number] DOB: 28-Jul-1941 DOA: 03/06/2021 PCP: [name], MD    Brief Narrative:  80 year old female with history of peripheral arterial disease, diabetes, hypertension, diastolic heart failure, admitted to the hospital with fever and altered mental status.  Found to be COVID-positive.  Started on remdesivir.  She was also noted to have bilateral pleural effusions and started on intravenous Lasix.   Assessment & Plan:   Principal Problem:   COVID-19 virus infection Active Problems:   HTN (hypertension)   HLD (hyperlipidemia)   DM2 (diabetes mellitus, type 2) (HCC)   Chronic diastolic CHF (congestive heart failure) (HCC)   Acute on chronic diastolic heart failure (HCC)   Peripheral arterial disease (HCC)   COPD (chronic obstructive pulmonary disease) (HCC)   GERD (gastroesophageal reflux disease)   Acute febrile illness   Hypoalbuminemia   Hyperglycemia due to diabetes mellitus (HCC)   Elevated troponin   Constipation   Acute metabolic encephalopathy -Suspect this may be related to acute illness/fever -CT head unremarkable -Urinalysis indicates possible UTI -Ammonia and ABG unremarkable -Overall mental status has improved   COVID-19 virus infection -She is currently on room air and does not appear to be hypoxic -She does have bilateral pleural effusions -She has been started on remdesivir -Hold off on steroids for now unless she develops hypoxia -Follow inflammatory markers   Acute on chronic diastolic congestive heart failure -Noted to have bilateral pleural effusion  -She has been started on intravenous Lasix -She does have some 1+ pitting lower extremity edema -Continue current treatments with diuresis -Echocardiogram shows normal ejection fraction, no evidence of pericardial effusion   Elevated troponin -Overall trend has been relatively flat -Suspect this is demand ischemia in the setting of acute illness and volume  overload   Hypertension -Stable -Continue on amlodipine, Coreg and hydralazine   Hyperlipidemia -Continue statin   COPD -No wheezing at this time -Continue on Pulmicort   Peripheral arterial disease -Recent revascularization on 01/23/2021 -Discussed her case with her vascular surgeon, Dr. Delana Meyer who is familiar with patient -Reported that her right foot pain is chronic -Husband also reports that discoloration in her foot is not new and she has been complaining of pain for several weeks -Recommendations per Dr. Delana Meyer we will continue on aspirin and Plavix as well as pain management for now and he will follow-up as an outpatient   Insulin-dependent diabetes -Continued on basal insulin as well as sliding scale -Continue to follow blood sugars   Possible UTI -Prior history of ESBL -Continue on Merrem for now until culture results are available   DVT prophylaxis: enoxaparin (LOVENOX) injection 40 mg Start: 03/07/21 1000 SCDs Start: 03/07/21 0306  Code Status: Full code Family Communication: Discussed with patient's husband Disposition Plan: Status is: Inpatient  Remains inpatient appropriate because:Ongoing active pain requiring inpatient pain management and Inpatient level of care appropriate due to severity of illness  Dispo: The patient is from: Home              Anticipated d/c is to:  TBD              Patient currently is not medically stable to d/c.   Difficult to place patient No         Consultants:    Procedures:    Antimicrobials:  Meropenem 8/3 >   Subjective: Complains of pain in her right foot  Objective: Vitals:   03/08/21 0616 03/08/21 0750 03/08/21 1443 03/08/21 2008  BP:   (!) 132/48   Pulse:   79   Resp:      Temp:   99.3 F (37.4 C)   TempSrc:   Oral   SpO2:  98% 100% 98%  Weight: 65.6 kg     Height:        Intake/Output Summary (Last 24 hours) at 03/08/2021 2054 Last data filed at 03/08/2021 1745 Gross per 24 hour  Intake  360 ml  Output --  Net 360 ml   Filed Weights   03/06/21 2208 03/08/21 0616  Weight: 63 kg 65.6 kg    Examination:  General exam: Appears calm and comfortable  Respiratory system: Clear to auscultation. Respiratory effort normal. Cardiovascular system: S1 & S2 heard, RRR. No JVD, murmurs, rubs, gallops or clicks. No pedal edema. Gastrointestinal system: Abdomen is nondistended, soft and nontender. No organomegaly or masses felt. Normal bowel sounds heard. Central nervous system: Alert and oriented. No focal neurological deficits. Extremities: Pulses are difficult to palpate in right foot, cool to touch as compared to left lower extremity Skin: Discoloration of toes on right foot as pictured below Psychiatry: Judgement and insight appear normal. Mood & affect appropriate.          Data Reviewed: I have personally reviewed following labs and imaging studies  CBC: Recent Labs  Lab 03/06/21 2238 03/08/21 0518  WBC 6.4 9.6  NEUTROABS 4.2 7.6  HGB 10.5* 10.8*  HCT 33.7* 35.2*  MCV 83.0 84.4  PLT 179 [id number]*   Basic Metabolic Panel: Recent Labs  Lab 03/06/21 2238 03/08/21 0518  NA 136 137  K 4.2 3.5  CL 105 104  CO2 24 23  GLUCOSE 192* 134*  BUN 15 15  CREATININE 0.61 0.52  CALCIUM 8.8* 8.7*  MG  --  2.1  PHOS  --  3.1   GFR: Estimated Creatinine Clearance: 58.5 mL/min (by C-G formula based on SCr of 0.52 mg/dL). Liver Function Tests: Recent Labs  Lab 03/06/21 2238 03/08/21 0518  AST 28 21  ALT 23 17  ALKPHOS 58 55  BILITOT 0.5 0.8  PROT 6.5 5.8*  ALBUMIN 3.1* 2.7*   No results for input(s): LIPASE, AMYLASE in the last 168 hours. Recent Labs  Lab 03/07/21 1052  AMMONIA 22   Coagulation Profile: Recent Labs  Lab 03/06/21 2238  INR 1.1   Cardiac Enzymes: No results for input(s): CKTOTAL, CKMB, CKMBINDEX, TROPONINI in the last 168 hours. BNP (last 3 results) No results for input(s): PROBNP in the last 8760 hours. HbA1C: Recent Labs     03/07/21 0456  HGBA1C 8.3*   CBG: Recent Labs  Lab 03/07/21 2149 03/08/21 0710 03/08/21 1124 03/08/21 1218 03/08/21 1616  GLUCAP 123* 139* 108* 109* 136*   Lipid Profile: No results for input(s): CHOL, HDL, LDLCALC, TRIG, CHOLHDL, LDLDIRECT in the last 72 hours. Thyroid Function Tests: No results for input(s): TSH, T4TOTAL, FREET4, T3FREE, THYROIDAB in the last 72 hours. Anemia Panel: Recent Labs    03/08/21 0518  FERRITIN 39   Sepsis Labs: Recent Labs  Lab 03/06/21 2238 03/07/21 0034 03/07/21 0456  PROCALCITON  --   --  <0.10  LATICACIDVEN 1.2 1.5  --     Recent Results (from the past 240 hour(s))  Resp Panel by RT-PCR (Flu A&B, Covid) Nasopharyngeal Swab     Status: Abnormal   Collection Time: 03/06/21 10:15 PM   Specimen: Nasopharyngeal Swab; Nasopharyngeal(NP) swabs in vial transport medium  Result Value Ref Range Status   SARS Coronavirus  2 by RT PCR POSITIVE (A) NEGATIVE Final    Comment: RESULT CALLED TO, READ BACK BY AND VERIFIED WITH: PRUITT,G @ 0035 ON 03/07/21 BY JUW (NOTE) SARS-CoV-2 target nucleic acids are DETECTED.  The SARS-CoV-2 RNA is generally detectable in upper respiratory specimens during the acute phase of infection. Positive results are indicative of the presence of the identified virus, but do not rule out bacterial infection or co-infection with other pathogens not detected by the test. Clinical correlation with patient history and other diagnostic information is necessary to determine patient infection status. The expected result is Negative.  Fact Sheet for Patients: EntrepreneurPulse.com.au  Fact Sheet for Healthcare Providers: IncredibleEmployment.be  This test is not yet approved or cleared by the [place] FDA and  has been authorized for detection and/or diagnosis of SARS-CoV-2 by FDA under an Emergency Use Authorization (EUA).  This EUA will remain in effect (meaning this test can be   used) for the duration of  the COVID-19 declaration under Section 564(b)(1) of the Act, 21 U.S.C. section 360bbb-3(b)(1), unless the authorization is terminated or revoked sooner.     Influenza A by PCR NEGATIVE NEGATIVE Final   Influenza B by PCR NEGATIVE NEGATIVE Final    Comment: (NOTE) The Xpert Xpress SARS-CoV-2/FLU/RSV plus assay is intended as an aid in the diagnosis of influenza from Nasopharyngeal swab specimens and should not be used as a sole basis for treatment. Nasal washings and aspirates are unacceptable for Xpert Xpress SARS-CoV-2/FLU/RSV testing.  Fact Sheet for Patients: EntrepreneurPulse.com.au  Fact Sheet for Healthcare Providers: IncredibleEmployment.be  This test is not yet approved or cleared by the [place] FDA and has been authorized for detection and/or diagnosis of SARS-CoV-2 by FDA under an Emergency Use Authorization (EUA). This EUA will remain in effect (meaning this test can be used) for the duration of the COVID-19 declaration under Section 564(b)(1) of the Act, 21 U.S.C. section 360bbb-3(b)(1), unless the authorization is terminated or revoked.  Performed at [facility], [address], [place], [place] [address]   Culture, blood (Routine x 2)     Status: None (Preliminary result)   Collection Time: 03/06/21 10:38 PM   Specimen: BLOOD RIGHT ARM  Result Value Ref Range Status   Specimen Description BLOOD RIGHT ARM  Final   Special Requests   Final    Blood Culture adequate volume BOTTLES DRAWN AEROBIC AND ANAEROBIC   Culture   Final    NO GROWTH < 12 HOURS Performed at [facility] [address], [place], [place] [address]    Report Status PENDING  Incomplete  Culture, blood (Routine x 2)     Status: None (Preliminary result)   Collection Time: 03/06/21 10:38 PM   Specimen: BLOOD LEFT ARM  Result Value Ref Range Status   Specimen Description BLOOD LEFT ARM  Final   Special Requests   Final     Blood Culture results may not be optimal due to an excessive volume of blood received in culture bottles BOTTLES DRAWN AEROBIC AND ANAEROBIC   Culture   Final    NO GROWTH < 12 HOURS Performed at [facility], [address], [place], [place] [address]    Report Status PENDING  Incomplete  Urine Culture     Status: Abnormal (Preliminary result)   Collection Time: 03/07/21  2:01 AM   Specimen: In/Out Cath Urine  Result Value Ref Range Status   Specimen Description   Final    IN/OUT CATH URINE Performed at [facility]  [address] [place], [place] [address]    Special Requests   Final    NONE Performed at [facility], [address] [place], [place] [address]    Culture (A)  Final    >=100,000 COLONIES/mL ESCHERICHIA COLI 10,000 COLONIES/mL PROTEUS MIRABILIS SUSCEPTIBILITIES TO FOLLOW Performed at [facility], [place] [address], [place], [place] [address]    Report Status PENDING  Incomplete         Radiology Studies: CT ABDOMEN PELVIS WO CONTRAST  Result Date: 03/06/2021 CLINICAL DATA:  Sepsis, febrile, unresponsive EXAM: CT ABDOMEN AND PELVIS WITHOUT CONTRAST TECHNIQUE: Multidetector CT imaging of the abdomen and pelvis was performed following the standard protocol without IV contrast. COMPARISON:  03/03/2016, 03/06/2021 FINDINGS: Lower chest: There are small bilateral pleural effusions, left greater than right. There is a left pericardial effusion measuring up to 2.9 cm in thickness along the posterior wall of the left ventricle. This is increased since prior study. Dense calcification of the aortic and mitral valves. Hepatobiliary: Calcified gallstone is identified without cholecystitis. Unenhanced imaging of the liver demonstrates no focal abnormalities. Pancreas: Unremarkable. No pancreatic ductal dilatation or surrounding inflammatory changes. Spleen: Normal in size without focal abnormality. Adrenals/Urinary Tract: There are bilateral nonobstructing renal calculi,  measuring 4 mm on the left and 2 mm on the right. Bladder is unremarkable. Stable nodularity of the left adrenal gland consistent with adenoma. The right adrenals unremarkable. Stomach/Bowel: No bowel obstruction or ileus. Moderate retained stool within the distal colon. There is distal colonic diverticulosis without diverticulitis. Normal appendix right lower quadrant. No bowel wall thickening or inflammatory change. Vascular/Lymphatic: Aortic atherosclerosis. No enlarged abdominal or pelvic lymph nodes. Reproductive: Uterus and bilateral adnexa are unremarkable. Calcified uterine fibroid again noted. Other: No free fluid or free intraperitoneal gas. Fat containing left inguinal hernia. No bowel herniation. Musculoskeletal: No acute or destructive bony lesions. Postsurgical changes proximal left femur. Reconstructed images demonstrate no additional findings. IMPRESSION: 1. Small bilateral pleural effusions, left greater than right. 2. Moderate pericardial effusion along the posterior aspect of the left ventricle. This may be loculated. 3. Cholelithiasis without cholecystitis. 4. Bilateral nonobstructing renal calculi. 5. Distal colonic diverticulosis without diverticulitis. 6.  Aortic Atherosclerosis (ICD10-I70.0). Electronically Signed   By: Randa Ngo M.D.   On: 03/06/2021 23:47   CT HEAD WO CONTRAST (5MM)  Result Date: 03/06/2021 CLINICAL DATA:  Delirium EXAM: CT HEAD WITHOUT CONTRAST TECHNIQUE: Contiguous axial images were obtained from the base of the skull through the vertex without intravenous contrast. COMPARISON:  Head CT 10/19/2020 FINDINGS: Brain: There is no mass, hemorrhage or extra-axial collection. There is generalized atrophy without lobar predilection. Hypodensity of the white matter is most commonly associated with chronic microvascular disease. Unchanged calcified mass in the right posterior fossa likely a meningioma. Unchanged focus of encephalomalacia in the right parietal lobe.  Vascular: Atherosclerotic calcification of the internal carotid arteries at the skull base. No abnormal hyperdensity of the major intracranial arteries or dural venous sinuses. Skull: The visualized skull base, calvarium and extracranial soft tissues are normal. Sinuses/Orbits: No fluid levels or advanced mucosal thickening of the visualized paranasal sinuses. No mastoid or middle ear effusion. The orbits are normal. IMPRESSION: 1. No acute intracranial abnormality. 2. Unchanged right posterior fossa meningioma. 3. Chronic microvascular ischemia and generalized atrophy. Electronically Signed   By: Ulyses Jarred M.D.   On: 03/06/2021 23:56   DG Chest Portable 1 View  Result Date: 03/06/2021 CLINICAL DATA:  Fever. EXAM: PORTABLE CHEST 1 VIEW COMPARISON:  March 30, 2020 FINDINGS: Moderate severity left basilar atelectasis and/or infiltrate is seen. There is a small left pleural effusion. No pneumothorax is identified. The cardiac silhouette is normal in size. Right-sided tracheal deviation is seen. A radiopaque vascular stent is noted within the neck soft tissues on the left. Degenerative changes are seen throughout the thoracic spine. IMPRESSION: 1. Moderate severity left basilar atelectasis and/or infiltrate. 2. Small left pleural effusion. Electronically Signed   By: Virgina Norfolk M.D.   On: 03/06/2021 22:50   ECHOCARDIOGRAM COMPLETE  Result Date: 03/08/2021    ECHOCARDIOGRAM REPORT   Patient Name:   DAPHNIE KATCHER Plath Date of Exam: 03/08/2021 Medical Rec #:  [id number]     Height:       68.0 in Accession #:    [id number]    Weight:       144.6 lb Date of Birth:  [birth year]      BSA:          1.781 m Patient Age:    37 years      BP:           137/52 mmHg Patient Gender: F             HR:           77 bpm. Exam Location:  [facility] Procedure: 2D Echo, Cardiac Doppler and Color Doppler Indications:    Pericardial Effusion  History:        Patient has prior history of Echocardiogram examinations, most                  recent 12/24/2018. CHF, CAD, Stroke, Aortic Valve Disease,                 Arrythmias:LBBB; Risk Factors:Hypertension and Diabetes. COVID                 +.  Sonographer:    Wenda Low Referring Phys: [place]  1. Left ventricular ejection fraction, by estimation, is 70 to 75%. The left ventricle has hyperdynamic function. The left ventricle has no regional wall motion abnormalities. There is severe asymmetric left ventricular hypertrophy of the septal segment. No significant LVOT gradient. Left ventricular diastolic parameters are consistent with Grade I diastolic dysfunction (impaired relaxation).  2. RV-RA gradient normal at 16 mmHg. Right ventricular systolic function is normal. The right ventricular size is normal.  3. Left atrial size was mildly dilated.  4. The mitral valve is abnormal, mildly thickened and calcified. Trivial mitral valve regurgitation. The mean mitral valve gradient is 3.0 mmHg. Moderate mitral annular calcification.  5. The aortic valve is tricuspid. There is moderate calcification of the aortic valve. Aortic valve regurgitation is not visualized. Moderate aortic valve stenosis. Aortic valve area, by VTI measures 1.43 cm. Aortic valve mean gradient measures 9.5 mmHg. Dimentionless index 0.45.  6. Unable to estimate CVP. Comparison(s): Prior images reviewed side by side. Aortic valve gradients measure less, but still overall consistent with moderate aortic stenosis. FINDINGS  Left Ventricle: Left ventricular ejection fraction, by estimation, is 70 to 75%. The left ventricle has hyperdynamic function. The left ventricle has no regional wall motion abnormalities. The left ventricular internal cavity size was small. There is severe asymmetric left ventricular hypertrophy of the septal segment. Left ventricular diastolic parameters are consistent with Grade I diastolic dysfunction (impaired relaxation). Right Ventricle: RV-RA gradient normal at 16 mmHg. The right  ventricular size is normal. No increase in right ventricular wall thickness. Right ventricular  systolic function is normal. Left Atrium: Left atrial size was mildly dilated. Right Atrium: Right atrial size was normal in size. Pericardium: There is no evidence of pericardial effusion. Presence of pericardial fat pad. Mitral Valve: The mitral valve is abnormal. There is mild thickening of the mitral valve leaflet(s). There is mild calcification of the mitral valve leaflet(s). Moderate mitral annular calcification. Trivial mitral valve regurgitation. MV peak gradient, 7.5 mmHg. The mean mitral valve gradient is 3.0 mmHg. Tricuspid Valve: The tricuspid valve is grossly normal. Tricuspid valve regurgitation is trivial. Aortic Valve: The aortic valve is tricuspid. There is moderate calcification of the aortic valve. There is mild aortic valve annular calcification. Aortic valve regurgitation is not visualized. Moderate aortic stenosis is present. Aortic valve mean gradient measures 9.5 mmHg. Aortic valve peak gradient measures 20.0 mmHg. Aortic valve area, by VTI measures 1.43 cm. Pulmonic Valve: The pulmonic valve was grossly normal. Pulmonic valve regurgitation is trivial. Aorta: The aortic root is normal in size and structure. Venous: Unable to estimate CVP. The inferior vena cava was not well visualized. IAS/Shunts: No atrial level shunt detected by color flow Doppler.  LEFT VENTRICLE PLAX 2D LVIDd:         2.99 cm  Diastology LVIDs:         1.73 cm  LV e' medial:    3.30 cm/s LV PW:         1.50 cm  LV E/e' medial:  19.9 LV IVS:        1.93 cm  LV e' lateral:   3.44 cm/s LVOT diam:     2.00 cm  LV E/e' lateral: 19.1 LV SV:         63 LV SV Index:   35 LVOT Area:     3.14 cm  RIGHT VENTRICLE RV Basal diam:  2.65 cm RV Mid diam:    1.94 cm RV S prime:     13.80 cm/s TAPSE (M-mode): 2.5 cm LEFT ATRIUM             Index       RIGHT ATRIUM           Index LA diam:        3.90 cm 2.19 cm/m  RA Area:     11.20 cm LA Vol  (A2C):   79.3 ml 44.53 ml/m RA Volume:   17.90 ml  10.05 ml/m LA Vol (A4C):   52.8 ml 29.65 ml/m LA Biplane Vol: 66.7 ml 37.46 ml/m  AORTIC VALVE AV Area (Vmax):    1.25 cm AV Area (Vmean):   1.29 cm AV Area (VTI):     1.43 cm AV Vmax:           223.50 cm/s AV Vmean:          133.500 cm/s AV VTI:            0.440 m AV Peak Grad:      20.0 mmHg AV Mean Grad:      9.5 mmHg LVOT Vmax:         88.70 cm/s LVOT Vmean:        54.700 cm/s LVOT VTI:          0.200 m LVOT/AV VTI ratio: 0.45  AORTA Ao Root diam: 2.80 cm Ao Asc diam:  2.80 cm MITRAL VALVE                TRICUSPID VALVE MV Area (PHT): 2.28 cm     TR Peak  grad:   16.5 mmHg MV Area VTI:   2.07 cm     TR Vmax:        203.00 cm/s MV Peak grad:  7.5 mmHg MV Mean grad:  3.0 mmHg     SHUNTS MV Vmax:       1.37 m/s     Systemic VTI:  0.20 m MV Vmean:      74.5 cm/s    Systemic Diam: 2.00 cm MV Decel Time: 333 msec MV E velocity: 65.60 cm/s MV A velocity: 111.00 cm/s MV E/A ratio:  0.59 Rozann Lesches MD Electronically signed by Rozann Lesches MD Signature Date/Time: 03/08/2021/3:20:14 PM    Final         Scheduled Meds:  albuterol  2 puff Inhalation BID   amLODipine  10 mg Oral Daily   vitamin C  500 mg Oral Daily   aspirin EC  81 mg Oral Daily   atorvastatin  20 mg Oral Daily   budesonide  1 puff Inhalation BID   carvedilol  6.25 mg Oral BID WC   clopidogrel  75 mg Oral Q breakfast   enoxaparin (LOVENOX) injection  40 mg Subcutaneous Q24H   feeding supplement (GLUCERNA SHAKE)  237 mL Oral TID BM   furosemide  20 mg Intravenous Q12H   hydrALAZINE  25 mg Oral Q8H   insulin aspart  0-15 Units Subcutaneous TID WC   insulin aspart  0-5 Units Subcutaneous QHS   insulin glargine-yfgn  8 Units Subcutaneous QHS   magnesium oxide  400 mg Oral BID   modafinil  200 mg Oral Daily   pantoprazole (PROTONIX) IV  40 mg Intravenous QHS   polyethylene glycol  17 g Oral BID   zinc sulfate  220 mg Oral Daily   Continuous Infusions:  sodium chloride  Stopped (03/07/21 0859)   meropenem (MERREM) IV Stopped (03/08/21 1253)   remdesivir 100 mg in NS 100 mL Stopped (03/08/21 0922)     LOS: 1 day    Time spent: 18mns    JKathie Dike MD [facility]   If 7PM-7AM, please contact night-coverage [email or web address]  03/08/2021, 8:54 PM

## 2021-03-08 NOTE — Progress Notes (Signed)
  Echocardiogram 2D Echocardiogram has been performed.  [name] 03/08/2021, 2:15 PM

## 2021-03-08 NOTE — Consult Note (Signed)
[facility] Nurse Consult Note: Patient receiving care in [phone] Patient is currently being followed by  Vein and Vascular Surgery. Last seen on 02/14/21 and at that time the patient did not wish to undergo any interventions. Studies show that this patient has greatly reduced flow to the right foot and atherosclerosis of left leg artery with necrotic toes. 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 Apply twice daily. Wrap the right great toe with Xeroform guaze dry small Kerlix and tape in place. Change daily   Thank you for the consult. [facility] nurse will not follow at this time.   Please re-consult the [facility] team if needed.  Cathlean Marseilles Tamala Julian, MSN, RN, [place], [place], [facility] Wound Treatment Associate Pager [phone]  c

## 2021-03-09 LAB — FERRITIN: Ferritin: 52 ng/mL (ref 11–307)

## 2021-03-09 LAB — CBC WITH DIFFERENTIAL/PLATELET
Abs Immature Granulocytes: 0.02 10*3/uL (ref 0.00–0.07)
Basophils Absolute: 0 10*3/uL (ref 0.0–0.1)
Basophils Relative: 1 %
Eosinophils Absolute: 0 10*3/uL (ref 0.0–0.5)
Eosinophils Relative: 0 %
HCT: 34.1 % — ABNORMAL LOW (ref 36.0–46.0)
Hemoglobin: 10.5 g/dL — ABNORMAL LOW (ref 12.0–15.0)
Immature Granulocytes: 1 %
Lymphocytes Relative: 32 %
Lymphs Abs: 1.3 10*3/uL (ref 0.7–4.0)
MCH: 25.4 pg — ABNORMAL LOW (ref 26.0–34.0)
MCHC: 30.8 g/dL (ref 30.0–36.0)
MCV: 82.4 fL (ref 80.0–100.0)
Monocytes Absolute: 0.4 10*3/uL (ref 0.1–1.0)
Monocytes Relative: 10 %
Neutro Abs: 2.4 10*3/uL (ref 1.7–7.7)
Neutrophils Relative %: 56 %
Platelets: 135 10*3/uL — ABNORMAL LOW (ref 150–400)
RBC: 4.14 MIL/uL (ref 3.87–5.11)
RDW: 15.2 % (ref 11.5–15.5)
WBC: 4.1 10*3/uL (ref 4.0–10.5)
nRBC: 0 % (ref 0.0–0.2)

## 2021-03-09 LAB — COMPREHENSIVE METABOLIC PANEL
ALT: 24 U/L (ref 0–44)
AST: 40 U/L (ref 15–41)
Albumin: 2.7 g/dL — ABNORMAL LOW (ref 3.5–5.0)
Alkaline Phosphatase: 50 U/L (ref 38–126)
Anion gap: 9 (ref 5–15)
BUN: 17 mg/dL (ref 8–23)
CO2: 25 mmol/L (ref 22–32)
Calcium: 8.6 mg/dL — ABNORMAL LOW (ref 8.9–10.3)
Chloride: 102 mmol/L (ref 98–111)
Creatinine, Ser: 0.51 mg/dL (ref 0.44–1.00)
GFR, Estimated: >60 mL/min (ref >60)
Glucose, Bld: 177 mg/dL — ABNORMAL HIGH (ref 70–99)
Potassium: 3.3 mmol/L — ABNORMAL LOW (ref 3.5–5.1)
Sodium: 136 mmol/L (ref 135–145)
Total Bilirubin: 0.7 mg/dL (ref 0.3–1.2)
Total Protein: 5.6 g/dL — ABNORMAL LOW (ref 6.5–8.1)

## 2021-03-09 LAB — GLUCOSE, CAPILLARY
Glucose-Capillary: 188 mg/dL — ABNORMAL HIGH (ref 70–99)
Glucose-Capillary: 158 mg/dL — ABNORMAL HIGH (ref 70–99)
Glucose-Capillary: 168 mg/dL — ABNORMAL HIGH (ref 70–99)
Glucose-Capillary: 111 mg/dL — ABNORMAL HIGH (ref 70–99)

## 2021-03-09 LAB — MAGNESIUM: Magnesium: 2.1 mg/dL (ref 1.7–2.4)

## 2021-03-09 LAB — PHOSPHORUS: Phosphorus: 2.4 mg/dL — ABNORMAL LOW (ref 2.5–4.6)

## 2021-03-09 LAB — D-DIMER, QUANTITATIVE: D-Dimer, Quant: 0.63 ug/mL-FEU — ABNORMAL HIGH (ref 0.00–0.50)

## 2021-03-09 LAB — C-REACTIVE PROTEIN: CRP: 2.6 mg/dL — ABNORMAL HIGH (ref <1.0)

## 2021-03-09 MED ORDER — FUROSEMIDE 10 MG/ML IJ SOLN
40.0000 mg | Freq: Two times a day (BID) | INTRAMUSCULAR | Status: DC
  Administered 2021-03-09 – 2021-03-10 (×2): 40 mg via INTRAVENOUS
  Filled 2021-03-09 (×2): qty 4

## 2021-03-09 MED ORDER — POTASSIUM CHLORIDE CRYS ER 20 MEQ PO TBCR
40.0000 meq | EXTENDED_RELEASE_TABLET | Freq: Once | ORAL | Status: AC
  Administered 2021-03-09: 40 meq via ORAL
  Filled 2021-03-09: qty 2

## 2021-03-09 MED ORDER — TRAMADOL HCL 50 MG PO TABS
50.0000 mg | ORAL_TABLET | Freq: Four times a day (QID) | ORAL | Status: DC | PRN
  Administered 2021-03-09 – 2021-03-10 (×2): 50 mg via ORAL
  Filled 2021-03-09 (×2): qty 1

## 2021-03-09 NOTE — Progress Notes (Signed)
PROGRESS NOTE    [name]  [id number] DOB: 1940/11/25 DOA: 03/06/2021 PCP: [name], MD    Brief Narrative:  80 year old female with history of peripheral arterial disease, diabetes, hypertension, diastolic heart failure, admitted to the hospital with fever and altered mental status.  Found to be COVID-positive.  Started on remdesivir.  She was also noted to have bilateral pleural effusions and started on intravenous Lasix.   Assessment & Plan:   Principal Problem:   COVID-19 virus infection Active Problems:   HTN (hypertension)   HLD (hyperlipidemia)   DM2 (diabetes mellitus, type 2) (HCC)   Chronic diastolic CHF (congestive heart failure) (HCC)   Acute on chronic diastolic heart failure (HCC)   Peripheral arterial disease (HCC)   COPD (chronic obstructive pulmonary disease) (HCC)   GERD (gastroesophageal reflux disease)   Acute febrile illness   Hypoalbuminemia   Hyperglycemia due to diabetes mellitus (HCC)   Elevated troponin   Constipation   Acute metabolic encephalopathy -Suspect this may be related to acute illness/fever -CT head unremarkable -Urinalysis indicates possible UTI -Ammonia and ABG unremarkable -Overall mental status has improved   COVID-19 virus infection -She is currently on room air and does not appear to be hypoxic -She does have bilateral pleural effusions -She has been started on remdesivir -Hold off on steroids for now unless she develops hypoxia -Follow inflammatory markers   Acute on chronic diastolic congestive heart failure -Noted to have bilateral pleural effusion  -She has been started on intravenous Lasix -She does have some 1+ pitting lower extremity edema -Continue current treatments with diuresis -Echocardiogram shows normal ejection fraction, no evidence of pericardial effusion   Elevated troponin -Overall trend has been relatively flat -Suspect this is demand ischemia in the setting of acute illness and volume  overload   Hypertension -Stable -Continue on amlodipine, Coreg and hydralazine   Hyperlipidemia -Continue statin   COPD -No wheezing at this time -Continue on Pulmicort   Peripheral arterial disease -Recent revascularization on 01/23/2021 -Discussed her case with her vascular surgeon, Dr. Delana Meyer who is familiar with patient -Reported that her right foot pain is chronic -Husband also reports that discoloration in her foot is not new and she has been complaining of pain for several weeks -Recommendations per Dr. Delana Meyer we will continue on aspirin and Plavix as well as pain management for now and he will follow-up as an outpatient -start on tramadol for pain   Insulin-dependent diabetes -Continued on basal insulin as well as sliding scale -blood sugars have been stable -Continue to follow blood sugars   Possible UTI -Prior history of ESBL -urine culture positive for E coli -Continue on Merrem for now until culture results are available   DVT prophylaxis: enoxaparin (LOVENOX) injection 40 mg Start: 03/07/21 1000 SCDs Start: 03/07/21 0306  Code Status: Full code Family Communication: Discussed with patient's husband 8/5 Disposition Plan: Status is: Inpatient  Remains inpatient appropriate because:Ongoing active pain requiring inpatient pain management and Inpatient level of care appropriate due to severity of illness  Dispo: The patient is from: Home              Anticipated d/c is to: Home              Patient currently is not medically stable to d/c.   Difficult to place patient No         Consultants:    Procedures:    Antimicrobials:  Meropenem 8/3 >   Subjective: Continues to have some  pain in right foot  Objective: Vitals:   03/08/21 2008 03/08/21 2108 03/09/21 0512 03/09/21 0721  BP:  (!) 136/49 (!) 157/43   Pulse:  76 70   Resp:  19 20   Temp:  97.6 F (36.4 C) 97.9 F (36.6 C)   TempSrc:  Oral Oral   SpO2: 98% 100% 99% 99%  Weight:       Height:        Intake/Output Summary (Last 24 hours) at 03/09/2021 1934 Last data filed at 03/09/2021 1800 Gross per 24 hour  Intake 2140 ml  Output 600 ml  Net 1540 ml   Filed Weights   03/06/21 2208 03/08/21 0616  Weight: 63 kg 65.6 kg    Examination:  General exam: Appears calm and comfortable  Respiratory system: Clear to auscultation. Respiratory effort normal. Cardiovascular system: S1 & S2 heard, RRR. No JVD, murmurs, rubs, gallops or clicks. No pedal edema. Gastrointestinal system: Abdomen is nondistended, soft and nontender. No organomegaly or masses felt. Normal bowel sounds heard. Central nervous system: Alert and oriented. No focal neurological deficits. Extremities: Pulses are difficult to palpate in right foot, cool to touch as compared to left lower extremity Skin: Discoloration of toes on right foot  Psychiatry: Judgement and insight appear normal. Mood & affect appropriate.    Data Reviewed: I have personally reviewed following labs and imaging studies  CBC: Recent Labs  Lab 03/06/21 2238 03/08/21 0518 03/09/21 0523  WBC 6.4 9.6 4.1  NEUTROABS 4.2 7.6 2.4  HGB 10.5* 10.8* 10.5*  HCT 33.7* 35.2* 34.1*  MCV 83.0 84.4 82.4  PLT 179 143* [id number]*   Basic Metabolic Panel: Recent Labs  Lab 03/06/21 2238 03/08/21 0518 03/09/21 0523  NA 136 137 136  K 4.2 3.5 3.3*  CL 105 104 102  CO2 '24 23 25  '$ GLUCOSE 192* 134* 177*  BUN '15 15 17  '$ CREATININE 0.61 0.52 0.51  CALCIUM 8.8* 8.7* 8.6*  MG  --  2.1 2.1  PHOS  --  3.1 2.4*   GFR: Estimated Creatinine Clearance: 58.5 mL/min (by C-G formula based on SCr of 0.51 mg/dL). Liver Function Tests: Recent Labs  Lab 03/06/21 2238 03/08/21 0518 03/09/21 0523  AST 28 21 40  ALT '23 17 24  '$ ALKPHOS 58 55 50  BILITOT 0.5 0.8 0.7  PROT 6.5 5.8* 5.6*  ALBUMIN 3.1* 2.7* 2.7*   No results for input(s): LIPASE, AMYLASE in the last 168 hours. Recent Labs  Lab 03/07/21 1052  AMMONIA 22   Coagulation  Profile: Recent Labs  Lab 03/06/21 2238  INR 1.1   Cardiac Enzymes: No results for input(s): CKTOTAL, CKMB, CKMBINDEX, TROPONINI in the last 168 hours. BNP (last 3 results) No results for input(s): PROBNP in the last 8760 hours. HbA1C: Recent Labs    03/07/21 0456  HGBA1C 8.3*   CBG: Recent Labs  Lab 03/08/21 1616 03/08/21 2105 03/09/21 0751 03/09/21 1123 03/09/21 1618  GLUCAP 136* 195* 188* 158* 168*   Lipid Profile: No results for input(s): CHOL, HDL, LDLCALC, TRIG, CHOLHDL, LDLDIRECT in the last 72 hours. Thyroid Function Tests: No results for input(s): TSH, T4TOTAL, FREET4, T3FREE, THYROIDAB in the last 72 hours. Anemia Panel: Recent Labs    03/08/21 0518 03/09/21 0523  FERRITIN 39 52   Sepsis Labs: Recent Labs  Lab 03/06/21 2238 03/07/21 0034 03/07/21 0456  PROCALCITON  --   --  <0.10  LATICACIDVEN 1.2 1.5  --     Recent Results (from the past 240  hour(s))  Resp Panel by RT-PCR (Flu A&B, Covid) Nasopharyngeal Swab     Status: Abnormal   Collection Time: 03/06/21 10:15 PM   Specimen: Nasopharyngeal Swab; Nasopharyngeal(NP) swabs in vial transport medium  Result Value Ref Range Status   SARS Coronavirus 2 by RT PCR POSITIVE (A) NEGATIVE Final    Comment: RESULT CALLED TO, READ BACK BY AND VERIFIED WITH: PRUITT,G @ 0035 ON 03/07/21 BY JUW (NOTE) SARS-CoV-2 target nucleic acids are DETECTED.  The SARS-CoV-2 RNA is generally detectable in upper respiratory specimens during the acute phase of infection. Positive results are indicative of the presence of the identified virus, but do not rule out bacterial infection or co-infection with other pathogens not detected by the test. Clinical correlation with patient history and other diagnostic information is necessary to determine patient infection status. The expected result is Negative.  Fact Sheet for Patients: EntrepreneurPulse.com.au  Fact Sheet for Healthcare  Providers: IncredibleEmployment.be  This test is not yet approved or cleared by the [place] FDA and  has been authorized for detection and/or diagnosis of SARS-CoV-2 by FDA under an Emergency Use Authorization (EUA).  This EUA will remain in effect (meaning this test can be  used) for the duration of  the COVID-19 declaration under Section 564(b)(1) of the Act, 21 U.S.C. section 360bbb-3(b)(1), unless the authorization is terminated or revoked sooner.     Influenza A by PCR NEGATIVE NEGATIVE Final   Influenza B by PCR NEGATIVE NEGATIVE Final    Comment: (NOTE) The Xpert Xpress SARS-CoV-2/FLU/RSV plus assay is intended as an aid in the diagnosis of influenza from Nasopharyngeal swab specimens and should not be used as a sole basis for treatment. Nasal washings and aspirates are unacceptable for Xpert Xpress SARS-CoV-2/FLU/RSV testing.  Fact Sheet for Patients: EntrepreneurPulse.com.au  Fact Sheet for Healthcare Providers: IncredibleEmployment.be  This test is not yet approved or cleared by the [place] FDA and has been authorized for detection and/or diagnosis of SARS-CoV-2 by FDA under an Emergency Use Authorization (EUA). This EUA will remain in effect (meaning this test can be used) for the duration of the COVID-19 declaration under Section 564(b)(1) of the Act, 21 U.S.C. section 360bbb-3(b)(1), unless the authorization is terminated or revoked.  Performed at [facility], [address], [place], [place] [address]   Culture, blood (Routine x 2)     Status: None (Preliminary result)   Collection Time: 03/06/21 10:38 PM   Specimen: BLOOD RIGHT ARM  Result Value Ref Range Status   Specimen Description BLOOD RIGHT ARM  Final   Special Requests   Final    Blood Culture adequate volume BOTTLES DRAWN AEROBIC AND ANAEROBIC   Culture   Final    NO GROWTH 3 DAYS Performed at [facility], [address],  [place], [place] [address]    Report Status PENDING  Incomplete  Culture, blood (Routine x 2)     Status: None (Preliminary result)   Collection Time: 03/06/21 10:38 PM   Specimen: BLOOD LEFT ARM  Result Value Ref Range Status   Specimen Description BLOOD LEFT ARM  Final   Special Requests   Final    Blood Culture results may not be optimal due to an excessive volume of blood received in culture bottles BOTTLES DRAWN AEROBIC AND ANAEROBIC   Culture   Final    NO GROWTH 3 DAYS Performed at [facility], [address], [place],  [address]    Report Status PENDING  Incomplete  Urine Culture  Status: Abnormal (Preliminary result)   Collection Time: 03/07/21  2:01 AM   Specimen: In/Out Cath Urine  Result Value Ref Range Status   Specimen Description   Final    IN/OUT CATH URINE Performed at [facility], [address], [place], [place] [address]    Special Requests   Final    NONE Performed at [facility], [address], [place], [place] [address]    Culture (A)  Final    >=100,000 COLONIES/mL ESCHERICHIA COLI 10,000 COLONIES/mL PROTEUS MIRABILIS SUSCEPTIBILITIES TO FOLLOW Performed at [facility], [place] [address], [place], [place] [address]    Report Status PENDING  Incomplete         Radiology Studies: ECHOCARDIOGRAM COMPLETE  Result Date: 03/08/2021    ECHOCARDIOGRAM REPORT   Patient Name:   [name] Date of Exam: 03/08/2021 Medical Rec #:  [id number]     Height:       68.0 in Accession #:    [id number]    Weight:       144.6 lb Date of Birth:  [birth year]      BSA:          1.781 m Patient Age:    20 years      BP:           137/52 mmHg Patient Gender: F             HR:           77 bpm. Exam Location:  [facility] Procedure: 2D Echo, Cardiac Doppler and Color Doppler Indications:    Pericardial Effusion  History:        Patient has prior history of Echocardiogram examinations, most                 recent 12/24/2018. CHF, CAD, Stroke, Aortic Valve Disease,                  Arrythmias:LBBB; Risk Factors:Hypertension and Diabetes. COVID                 +.  Sonographer:    Wenda Low Referring Phys: [place]  1. Left ventricular ejection fraction, by estimation, is 70 to 75%. The left ventricle has hyperdynamic function. The left ventricle has no regional wall motion abnormalities. There is severe asymmetric left ventricular hypertrophy of the septal segment. No significant LVOT gradient. Left ventricular diastolic parameters are consistent with Grade I diastolic dysfunction (impaired relaxation).  2. RV-RA gradient normal at 16 mmHg. Right ventricular systolic function is normal. The right ventricular size is normal.  3. Left atrial size was mildly dilated.  4. The mitral valve is abnormal, mildly thickened and calcified. Trivial mitral valve regurgitation. The mean mitral valve gradient is 3.0 mmHg. Moderate mitral annular calcification.  5. The aortic valve is tricuspid. There is moderate calcification of the aortic valve. Aortic valve regurgitation is not visualized. Moderate aortic valve stenosis. Aortic valve area, by VTI measures 1.43 cm. Aortic valve mean gradient measures 9.5 mmHg. Dimentionless index 0.45.  6. Unable to estimate CVP. Comparison(s): Prior images reviewed side by side. Aortic valve gradients measure less, but still overall consistent with moderate aortic stenosis. FINDINGS  Left Ventricle: Left ventricular ejection fraction, by estimation, is 70 to 75%. The left ventricle has hyperdynamic function. The left ventricle has no regional wall motion abnormalities. The left ventricular internal cavity size was small. There is severe asymmetric left ventricular hypertrophy of the septal segment. Left ventricular  diastolic parameters are consistent with Grade I diastolic dysfunction (impaired relaxation). Right Ventricle: RV-RA gradient normal at 16 mmHg. The right ventricular size is normal. No increase in right ventricular wall  thickness. Right ventricular systolic function is normal. Left Atrium: Left atrial size was mildly dilated. Right Atrium: Right atrial size was normal in size. Pericardium: There is no evidence of pericardial effusion. Presence of pericardial fat pad. Mitral Valve: The mitral valve is abnormal. There is mild thickening of the mitral valve leaflet(s). There is mild calcification of the mitral valve leaflet(s). Moderate mitral annular calcification. Trivial mitral valve regurgitation. MV peak gradient, 7.5 mmHg. The mean mitral valve gradient is 3.0 mmHg. Tricuspid Valve: The tricuspid valve is grossly normal. Tricuspid valve regurgitation is trivial. Aortic Valve: The aortic valve is tricuspid. There is moderate calcification of the aortic valve. There is mild aortic valve annular calcification. Aortic valve regurgitation is not visualized. Moderate aortic stenosis is present. Aortic valve mean gradient measures 9.5 mmHg. Aortic valve peak gradient measures 20.0 mmHg. Aortic valve area, by VTI measures 1.43 cm. Pulmonic Valve: The pulmonic valve was grossly normal. Pulmonic valve regurgitation is trivial. Aorta: The aortic root is normal in size and structure. Venous: Unable to estimate CVP. The inferior vena cava was not well visualized. IAS/Shunts: No atrial level shunt detected by color flow Doppler.  LEFT VENTRICLE PLAX 2D LVIDd:         2.99 cm  Diastology LVIDs:         1.73 cm  LV e' medial:    3.30 cm/s LV PW:         1.50 cm  LV E/e' medial:  19.9 LV IVS:        1.93 cm  LV e' lateral:   3.44 cm/s LVOT diam:     2.00 cm  LV E/e' lateral: 19.1 LV SV:         63 LV SV Index:   35 LVOT Area:     3.14 cm  RIGHT VENTRICLE RV Basal diam:  2.65 cm RV Mid diam:    1.94 cm RV S prime:     13.80 cm/s TAPSE (M-mode): 2.5 cm LEFT ATRIUM             Index       RIGHT ATRIUM           Index LA diam:        3.90 cm 2.19 cm/m  RA Area:     11.20 cm LA Vol (A2C):   79.3 ml 44.53 ml/m RA Volume:   17.90 ml  10.05 ml/m  LA Vol (A4C):   52.8 ml 29.65 ml/m LA Biplane Vol: 66.7 ml 37.46 ml/m  AORTIC VALVE AV Area (Vmax):    1.25 cm AV Area (Vmean):   1.29 cm AV Area (VTI):     1.43 cm AV Vmax:           223.50 cm/s AV Vmean:          133.500 cm/s AV VTI:            0.440 m AV Peak Grad:      20.0 mmHg AV Mean Grad:      9.5 mmHg LVOT Vmax:         88.70 cm/s LVOT Vmean:        54.700 cm/s LVOT VTI:          0.200 m LVOT/AV VTI ratio: 0.45  AORTA Ao Root diam: 2.80 cm Ao  Asc diam:  2.80 cm MITRAL VALVE                TRICUSPID VALVE MV Area (PHT): 2.28 cm     TR Peak grad:   16.5 mmHg MV Area VTI:   2.07 cm     TR Vmax:        203.00 cm/s MV Peak grad:  7.5 mmHg MV Mean grad:  3.0 mmHg     SHUNTS MV Vmax:       1.37 m/s     Systemic VTI:  0.20 m MV Vmean:      74.5 cm/s    Systemic Diam: 2.00 cm MV Decel Time: 333 msec MV E velocity: 65.60 cm/s MV A velocity: 111.00 cm/s MV E/A ratio:  0.59 Rozann Lesches MD Electronically signed by Rozann Lesches MD Signature Date/Time: 03/08/2021/3:20:14 PM    Final         Scheduled Meds:  albuterol  2 puff Inhalation BID   amLODipine  10 mg Oral Daily   vitamin C  500 mg Oral Daily   aspirin EC  81 mg Oral Daily   atorvastatin  20 mg Oral Daily   budesonide  1 puff Inhalation BID   carvedilol  6.25 mg Oral BID WC   clopidogrel  75 mg Oral Q breakfast   enoxaparin (LOVENOX) injection  40 mg Subcutaneous Q24H   feeding supplement (GLUCERNA SHAKE)  237 mL Oral TID BM   furosemide  20 mg Intravenous Q12H   hydrALAZINE  25 mg Oral Q8H   insulin aspart  0-15 Units Subcutaneous TID WC   insulin aspart  0-5 Units Subcutaneous QHS   insulin glargine-yfgn  8 Units Subcutaneous QHS   magnesium oxide  400 mg Oral BID   modafinil  200 mg Oral Daily   pantoprazole (PROTONIX) IV  40 mg Intravenous QHS   polyethylene glycol  17 g Oral BID   zinc sulfate  220 mg Oral Daily   Continuous Infusions:  sodium chloride Stopped (03/07/21 0859)   meropenem (MERREM) IV 1 g (03/09/21  1244)   remdesivir 100 mg in NS 100 mL 100 mg (03/09/21 0931)     LOS: 2 days    Time spent: 60mns    JKathie Dike MD [facility]   If 7PM-7AM, please contact night-coverage [email or web address]  03/09/2021, 7:34 PM

## 2021-03-09 NOTE — Evaluation (Signed)
Physical Therapy Evaluation Patient Details Name: [name] MRN: [id number] DOB: 1941/02/26 Today's Date: 03/09/2021   History of Present Illness  [name] is a 80 y.o. female with medical history significant for peripheral artery disease on aspirin/Plavix and Lipitor, type 2 diabetes, essential hypertension, hyperlipidemia, chronic diastolic CHF who presents to the emergency department via EMS from home due to fever.  Patient was unable to provide history, history was obtained from the ED physician, ED medical record and was husband by phone.  Per report, patient has been having subjective fever within last 2 days and has also been less talkative and generally not acting like herself.  Patient has an aide that usually come to assist her and usually leave by 5pm, when leaving today, she told the husband that patient has only taken a few bites of her meals since morning.  Patient's husband tried to encourage her to eat, but since she was uncooperative, he activated EMS.  On arrival of EMS, temperature was checked and it was 101F, so it was decided for patient to be taken to the ED for further evaluation and management.   Clinical Impression  Patient functioning at baseline for functional mobility which is non-ambulatory and uses hoyar lift for transfers at home assisted by family. Plan:  Patient discharged from physical therapy to care of nursing for OOB as tolerated using mechanical lift.      Follow Up Recommendations No PT follow up;Supervision for mobility/OOB;Supervision - Intermittent    Equipment Recommendations  None recommended by PT    Recommendations for Other Services       Precautions / Restrictions Precautions Precautions: Fall Restrictions Weight Bearing Restrictions: No      Mobility  Bed Mobility Overal bed mobility: Needs Assistance Bed Mobility: Supine to Sit;Sit to Supine     Supine to sit: Max assist;Total assist Sit to supine: Max assist;Total assist         Transfers                    Ambulation/Gait                Stairs            Wheelchair Mobility    Modified Rankin (Stroke Patients Only)       Balance Overall balance assessment: Needs assistance Sitting-balance support: Feet supported;Bilateral upper extremity supported Sitting balance-Leahy Scale: Poor Sitting balance - Comments: seated at EOB                                     Pertinent Vitals/Pain Pain Assessment: No/denies pain    Home Living Family/patient expects to be discharged to:: Private residence Living Arrangements: Spouse/significant other Available Help at Discharge: Family Type of Home: House Home Access: Ramped entrance     Home Layout: One level Home Equipment: Wheelchair - manual;Hospital bed;Other (comment) Additional Comments: Hoyer lift    Prior Function Level of Independence: Needs assistance   Gait / Transfers Assistance Needed: Per daughter via phone call pt progressively weaker over the last year and has been non-ambulatory, uses a hoyer lift for transfers to w/c  ADL's / Homemaking Assistance Needed: Pt requires assist for all ADLs  Comments: Per chart & pt's husband, pt has been non-ambulatory in hospital bed for the past 2-3 years. Uses hoyer lift to transfer to w/c but pt does not propel herself. Uses public transportation (  w/c accessible) to get to appointments. Pt's husband provides total assist for peri hygiene & meal prep. Has an aide 4 hours/day, 3 days/week. (patient is poor historian, all info from previous admission)     Hand Dominance   Dominant Hand: Right    Extremity/Trunk Assessment   Upper Extremity Assessment Upper Extremity Assessment: Generalized weakness    Lower Extremity Assessment Lower Extremity Assessment: Generalized weakness (contractures BLE)       Communication   Communication: No difficulties  Cognition Arousal/Alertness: Awake/alert Behavior  During Therapy: Flat affect Overall Cognitive Status: History of cognitive impairments - at baseline                                        General Comments      Exercises     Assessment/Plan    PT Assessment Patent does not need any further PT services  PT Problem List         PT Treatment Interventions      PT Goals (Current goals can be found in the Care Plan section)  Acute Rehab PT Goals Patient Stated Goal: return home with family to assist PT Goal Formulation: With patient Time For Goal Achievement: 03/09/21 Potential to Achieve Goals: Poor    Frequency     Barriers to discharge        Co-evaluation               AM-PAC PT "6 Clicks" Mobility  Outcome Measure Help needed turning from your back to your side while in a flat bed without using bedrails?: Total Help needed moving from lying on your back to sitting on the side of a flat bed without using bedrails?: Total Help needed moving to and from a bed to a chair (including a wheelchair)?: Total Help needed standing up from a chair using your arms (e.g., wheelchair or bedside chair)?: Total Help needed to walk in hospital room?: Total Help needed climbing 3-5 steps with a railing? : Total 6 Click Score: 6    End of Session   Activity Tolerance: Patient limited by fatigue;Patient limited by pain Patient left: in bed;with call bell/phone within reach;with chair alarm set Nurse Communication: Mobility status PT Visit Diagnosis: Unsteadiness on feet (R26.81);Other abnormalities of gait and mobility (R26.89);Muscle weakness (generalized) (M62.81)    Time: [id number] PT Time Calculation (min) (ACUTE ONLY): 21 min   Charges:   PT Evaluation $PT Eval Moderate Complexity: 1 Mod PT Treatments $Therapeutic Activity: 8-22 mins        3:25 PM, 03/09/21 Lonell Grandchild, MPT Physical Therapist with [facility] [phone] office [phone] mobile phone

## 2021-03-09 NOTE — Care Management Important Message (Signed)
Important Message  Patient Details  Name: [name] MRN: [id number] Date of Birth: [birth year]   Medicare Important Message Given:  Yes (asked [place], LPN to deliver due to contact precautions)     Tommy Medal 03/09/2021, 2:28 PM

## 2021-03-09 NOTE — Progress Notes (Signed)
Initial Nutrition Assessment  DOCUMENTATION CODES:      INTERVENTION:  Glucerna Shake po TID, each supplement provides 220 kcal and 10 grams of protein   Magic cup TID with meals, each supplement provides 290 kcal and 9 grams of protein   Recommend liberalize diet to regular during acute illness  NUTRITION DIAGNOSIS:   Increased nutrient needs related to wound healing as evidenced by estimated needs.   GOAL:  Patient will meet greater than or equal to 90% of their needs  MONITOR:  PO intake, Supplement acceptance, Labs, Skin, Weight trends  REASON FOR ASSESSMENT:   Malnutrition Screening Tool    ASSESSMENT: Patient is COVID positive with right great toe vascular wound. History of GERD, COPD, PAD, DM2, HTN, CHF.   Patient appetite diminished; po: 0-45% meals. Add oral supplement to all trays due to poor intake and recommend liberalize diet to least restrictive. Encourage meals and supplements to meet increased protein/energy needs with COVID and wounds.  Weight review- currently 65.6 kg and 6/22- 63 kg and in March 66 kg.    Medications reviewed and include: Vitamin C, Zinc, Lipitor, Insulin, Lasix, Miralax, Protonix.   Labs: BMP Latest Ref Rng & Units 03/09/2021 03/08/2021 03/06/2021  Glucose 70 - 99 mg/dL 177(H) 134(H) 192(H)  BUN 8 - 23 mg/dL '17 15 15  '$ Creatinine 0.44 - 1.00 mg/dL 0.51 0.52 0.61  Sodium 135 - 145 mmol/L 136 137 136  Potassium 3.5 - 5.1 mmol/L 3.3(L) 3.5 4.2  Chloride 98 - 111 mmol/L 102 104 105  CO2 22 - 32 mmol/L '25 23 24  '$ Calcium 8.9 - 10.3 mg/dL 8.6(L) 8.7(L) 8.8(L)     NUTRITION - FOCUSED PHYSICAL EXAM: Unable to complete Nutrition-Focused physical exam at this time.    Diet Order:   Diet Order             Diet heart healthy/carb modified Room service appropriate? 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  Estimated Nutritional Needs:   Kcal:  1800-1900  Protein:  90-96 gr  Fluid:  < 2 liters daily  Colman Cater MS,RD,CSG,LDN Contact: Shea Evans

## 2021-03-10 LAB — GLUCOSE, CAPILLARY
Glucose-Capillary: 151 mg/dL — ABNORMAL HIGH (ref 70–99)
Glucose-Capillary: 161 mg/dL — ABNORMAL HIGH (ref 70–99)

## 2021-03-10 LAB — COMPREHENSIVE METABOLIC PANEL
ALT: 25 U/L (ref 0–44)
AST: 32 U/L (ref 15–41)
Albumin: 2.6 g/dL — ABNORMAL LOW (ref 3.5–5.0)
Alkaline Phosphatase: 45 U/L (ref 38–126)
Anion gap: 7 (ref 5–15)
BUN: 15 mg/dL (ref 8–23)
CO2: 27 mmol/L (ref 22–32)
Calcium: 8.7 mg/dL — ABNORMAL LOW (ref 8.9–10.3)
Chloride: 107 mmol/L (ref 98–111)
Creatinine, Ser: 0.45 mg/dL (ref 0.44–1.00)
GFR, Estimated: >60 mL/min (ref >60)
Glucose, Bld: 151 mg/dL — ABNORMAL HIGH (ref 70–99)
Potassium: 3.4 mmol/L — ABNORMAL LOW (ref 3.5–5.1)
Sodium: 141 mmol/L (ref 135–145)
Total Bilirubin: 0.6 mg/dL (ref 0.3–1.2)
Total Protein: 5.3 g/dL — ABNORMAL LOW (ref 6.5–8.1)

## 2021-03-10 LAB — CBC WITH DIFFERENTIAL/PLATELET
Abs Immature Granulocytes: 0.01 10*3/uL (ref 0.00–0.07)
Basophils Absolute: 0 10*3/uL (ref 0.0–0.1)
Basophils Relative: 1 %
Eosinophils Absolute: 0 10*3/uL (ref 0.0–0.5)
Eosinophils Relative: 0 %
HCT: 31.4 % — ABNORMAL LOW (ref 36.0–46.0)
Hemoglobin: 9.8 g/dL — ABNORMAL LOW (ref 12.0–15.0)
Immature Granulocytes: 0 %
Lymphocytes Relative: 34 %
Lymphs Abs: 1.5 10*3/uL (ref 0.7–4.0)
MCH: 25.5 pg — ABNORMAL LOW (ref 26.0–34.0)
MCHC: 31.2 g/dL (ref 30.0–36.0)
MCV: 81.6 fL (ref 80.0–100.0)
Monocytes Absolute: 0.4 10*3/uL (ref 0.1–1.0)
Monocytes Relative: 9 %
Neutro Abs: 2.4 10*3/uL (ref 1.7–7.7)
Neutrophils Relative %: 56 %
Platelets: 148 10*3/uL — ABNORMAL LOW (ref 150–400)
RBC: 3.85 MIL/uL — ABNORMAL LOW (ref 3.87–5.11)
RDW: 15.3 % (ref 11.5–15.5)
WBC: 4.3 10*3/uL (ref 4.0–10.5)
nRBC: 0 % (ref 0.0–0.2)

## 2021-03-10 LAB — MAGNESIUM: Magnesium: 2.1 mg/dL (ref 1.7–2.4)

## 2021-03-10 LAB — PHOSPHORUS: Phosphorus: 1.7 mg/dL — ABNORMAL LOW (ref 2.5–4.6)

## 2021-03-10 LAB — C-REACTIVE PROTEIN: CRP: 0.8 mg/dL (ref <1.0)

## 2021-03-10 LAB — URINE CULTURE: Culture: >=100000 — AB

## 2021-03-10 LAB — D-DIMER, QUANTITATIVE: D-Dimer, Quant: 0.59 ug/mL-FEU — ABNORMAL HIGH (ref 0.00–0.50)

## 2021-03-10 LAB — FERRITIN: Ferritin: 53 ng/mL (ref 11–307)

## 2021-03-10 MED ORDER — TRAMADOL HCL 50 MG PO TABS: 50.0000 mg | ORAL_TABLET | Freq: Two times a day (BID) | ORAL | 0 refills | Status: DC | PRN

## 2021-03-10 MED ORDER — INSULIN GLARGINE 100 UNIT/ML ~~LOC~~ SOLN: 10.0000 [IU] | Freq: Every day | SUBCUTANEOUS | Status: DC

## 2021-03-10 MED ORDER — POTASSIUM PHOSPHATES 15 MMOLE/5ML IV SOLN
30.0000 mmol | Freq: Once | INTRAVENOUS | Status: AC
  Administered 2021-03-10: 30 mmol via INTRAVENOUS
  Filled 2021-03-10: qty 10

## 2021-03-10 NOTE — Discharge Summary (Signed)
Physician Discharge Summary  [name] [id number] DOB: Oct 24, 1940 DOA: 03/06/2021  PCP: [name], MD  Admit date: 03/06/2021 Discharge date: 03/10/2021  Admitted From: home Disposition:  home  Recommendations for Outpatient Follow-up:  Follow up with PCP in 1-2 weeks Please obtain BMP/CBC in one week Follow up with Dr. Delana Meyer, vascular surgery later this month as previously scheduled   Home [place] RN Equipment/Devices:  Discharge Condition:stable CODE STATUS:full code Diet recommendation: heart healthy, carb modified  Brief/Interim Summary: 80 year old female with history of peripheral arterial disease, diabetes, hypertension, diastolic heart failure, admitted to the hospital with fever and altered mental status.  Found to be COVID-positive.  Started on remdesivir.  She was also noted to have bilateral pleural effusions and started on intravenous Lasix.  Discharge Diagnoses:  Principal Problem:   COVID-19 virus infection Active Problems:   HTN (hypertension)   HLD (hyperlipidemia)   DM2 (diabetes mellitus, type 2) (HCC)   Chronic diastolic CHF (congestive heart failure) (HCC)   Acute on chronic diastolic heart failure (HCC)   Peripheral arterial disease (HCC)   COPD (chronic obstructive pulmonary disease) (HCC)   GERD (gastroesophageal reflux disease)   Acute febrile illness   Hypoalbuminemia   Hyperglycemia due to diabetes mellitus (HCC)   Elevated troponin   Constipation  Acute metabolic encephalopathy -Suspect this may be related to acute illness/fever -CT head unremarkable -Urinalysis indicates possible UTI -Ammonia and ABG unremarkable -Overall mental status has improved   COVID-19 virus infection -She is currently on room air and does not appear to be hypoxic -She does have bilateral pleural effusions -She was treated with remdesivir -Hold off on steroids for now unless she develops hypoxia -inflammatory markers stable   Acute on chronic  diastolic congestive heart failure -Noted to have bilateral pleural effusion -She has been started on intravenous Lasix -She had 1+ pitting lower extremity edema -Echocardiogram shows normal ejection fraction, no evidence of pericardial effusion -now appears to be euvolemic, so transitioned back to oral torsemide   Elevated troponin -Overall trend has been relatively flat -Suspect this is demand ischemia in the setting of acute illness and volume overload   Hypertension -Stable -Continue on amlodipine, Coreg and hydralazine   Hyperlipidemia -Continue statin   COPD -No wheezing at this time -Continue on Pulmicort   Peripheral arterial disease with chronic ischemia to right foot -Recent revascularization on 01/23/2021 of left foot -right foot was revascularized in 10/2020 -patient has been resistant to 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 Delana Meyer who is familiar with patient -Reported that her right foot pain is chronic from chronic ischemia, as is the discoloration in her foot -Husband also reports that discoloration in her foot is not new and she has been complaining of pain for several weeks -Recommendations per Dr. Delana Meyer were to continue on aspirin and Plavix as well as pain management for now and he will follow-up in the next few weeks as an outpatient -started on tramadol for pain   Insulin-dependent diabetes -Continued on basal insulin as well as sliding scale -blood sugars have been stable -Continue to follow blood sugars   UTI -Prior history of ESBL -urine culture positive for E coli -she has completed 3 days of meropenem, does not have any fever, leukocytosis or dysuria -UTI appears to be adequately treated  Dispo. -seen by PT and noted to be at baseline level of functioning, which is non ambulatory, wheelchair bound, using a hoyer lift for transfers  Discharge  Instructions  Discharge Instructions     Diet - low  sodium heart healthy   Complete by: As directed    Discharge wound care:   Complete by: As directed    Paint the eschar toes with Betadine and allow to air dry. Apply twice daily. Wrap the right great toe with Xeroform guaze dry small Kerlix and tape in place. Change daily   Increase activity slowly   Complete by: As directed       Allergies as of 03/10/2021       Reactions   Iodine Anaphylaxis   Penicillins Anaphylaxis   Tolerates ceftriaxone, cefazolin Did it involve swelling of the face/tongue/throat, SOB, or low BP? Unknown Did it involve sudden or severe rash/hives, skin peeling, or any reaction on the inside of your mouth or nose? Unknown Did you need to seek medical attention at a hospital or doctor's office? Unknown When did it last happen?      Unknown If all above answers are "NO", may proceed with cephalosporin use.   Shellfish Allergy Anaphylaxis   Sulfa Antibiotics Anaphylaxis   Sulfacetamide Sodium Anaphylaxis   Sulfasalazine Anaphylaxis   Eggs Or Egg-derived Products    Other reaction(s): SHORTNESS OF BREATH   Morphine And Related Other (See Comments)   Altered mental status        Medication List     STOP taking these medications    ciclopirox 8 % solution Commonly known as: PENLAC       TAKE these medications    acetaminophen 500 MG tablet Commonly known as: TYLENOL Take 1,000 mg by mouth every 6 (six) hours as needed for mild pain.   amLODipine 10 MG tablet Commonly known as: NORVASC Take 10 mg by mouth daily.   aspirin 81 MG EC tablet Take 1 tablet (81 mg total) by mouth daily.   atorvastatin 20 MG tablet Commonly known as: LIPITOR Take 1 tablet (20 mg total) by mouth daily. What changed: when to take this   budesonide 0.25 MG/2ML nebulizer solution Commonly known as: PULMICORT Take 2 mLs (0.25 mg total) by nebulization 2 (two) times daily. What changed: when to take this   carvedilol 6.25 MG tablet Commonly known as: COREG Take  6.25 mg by mouth 2 (two) times daily with a meal.   cetirizine 10 MG tablet Commonly known as: ZYRTEC Take 10 mg by mouth daily.   cholecalciferol 25 MCG (1000 UNIT) tablet Commonly known as: VITAMIN D3 Take 1,000 Units by mouth daily.   clopidogrel 75 MG tablet Commonly known as: PLAVIX Take 1 tablet (75 mg total) by mouth daily with breakfast.   diclofenac Sodium 1 % Gel Commonly known as: VOLTAREN Apply 1 application topically 4 (four) times daily as needed (pain).   docusate sodium 100 MG capsule Commonly known as: COLACE Take 100 mg by mouth daily.   fluticasone 50 MCG/ACT nasal spray Commonly known as: FLONASE Place 1 spray into both nostrils daily as needed for allergies.   gabapentin 300 MG capsule Commonly known as: NEURONTIN Take 300 mg by mouth at bedtime.   hydrALAZINE 25 MG tablet Commonly known as: APRESOLINE Take 1 tablet (25 mg total) by mouth every 8 (eight) hours. What changed: when to take this   insulin glargine 100 UNIT/ML injection Commonly known as: LANTUS Inject 0.1 mLs (10 Units total) into the skin at bedtime. What changed:  how much to take Another medication with the same name was removed. Continue taking this medication, and follow the  directions you see here.   magnesium oxide 400 MG tablet Commonly known as: MAG-OX Take 400 mg by mouth 2 (two) times daily.   modafinil 200 MG tablet Commonly known as: PROVIGIL Take 200 mg by mouth daily.   Nyamyc powder Generic drug: nystatin Apply topically.   omeprazole 20 MG capsule Commonly known as: PRILOSEC Take 1 capsule (20 mg total) by mouth daily.   polyethylene glycol 17 g packet Commonly known as: MIRALAX / GLYCOLAX Take 17 g by mouth 2 (two) times daily.   potassium chloride SA 20 MEQ tablet Commonly known as: KLOR-CON Take 20 mEq by mouth daily.   torsemide 20 MG tablet Commonly known as: DEMADEX Take 1 tablet (20 mg total) by mouth 2 (two) times daily.   traMADol 50 MG  tablet Commonly known as: ULTRAM Take 1 tablet (50 mg total) by mouth every 12 (twelve) hours as needed for moderate pain.   Vitamin E 180 MG Caps Take 180 mg by mouth daily.               Discharge Care Instructions  (From admission, onward)           Start     Ordered   03/10/21 0000  Discharge wound care:       Comments: Paint the eschar toes with Betadine and allow to air dry. Apply twice daily. Wrap the right great toe with Xeroform guaze dry small Kerlix and tape in place. Change daily   03/10/21 1152            Allergies  Allergen Reactions   Iodine Anaphylaxis   Penicillins Anaphylaxis    Tolerates ceftriaxone, cefazolin Did it involve swelling of the face/tongue/throat, SOB, or low BP? Unknown Did it involve sudden or severe rash/hives, skin peeling, or any reaction on the inside of your mouth or nose? Unknown Did you need to seek medical attention at a hospital or doctor's office? Unknown When did it last happen?      Unknown If all above answers are "NO", may proceed with cephalosporin use.    Shellfish Allergy Anaphylaxis   Sulfa Antibiotics Anaphylaxis   Sulfacetamide Sodium Anaphylaxis   Sulfasalazine Anaphylaxis   Eggs Or Egg-Derived Products     Other reaction(s): SHORTNESS OF BREATH   Morphine And Related Other (See Comments)    Altered mental status    Consultations:    Procedures/Studies: CT ABDOMEN PELVIS WO CONTRAST  Result Date: 03/06/2021 CLINICAL DATA:  Sepsis, febrile, unresponsive EXAM: CT ABDOMEN AND PELVIS WITHOUT CONTRAST TECHNIQUE: Multidetector CT imaging of the abdomen and pelvis was performed following the standard protocol without IV contrast. COMPARISON:  03/03/2016, 03/06/2021 FINDINGS: Lower chest: There are small bilateral pleural effusions, left greater than right. There is a left pericardial effusion measuring up to 2.9 cm in thickness along the posterior wall of the left ventricle. This is increased since prior  study. Dense calcification of the aortic and mitral valves. Hepatobiliary: Calcified gallstone is identified without cholecystitis. Unenhanced imaging of the liver demonstrates no focal abnormalities. Pancreas: Unremarkable. No pancreatic ductal dilatation or surrounding inflammatory changes. Spleen: Normal in size without focal abnormality. Adrenals/Urinary Tract: There are bilateral nonobstructing renal calculi, measuring 4 mm on the left and 2 mm on the right. Bladder is unremarkable. Stable nodularity of the left adrenal gland consistent with adenoma. The right adrenals unremarkable. Stomach/Bowel: No bowel obstruction or ileus. Moderate retained stool within the distal colon. There is distal colonic diverticulosis without diverticulitis. Normal appendix right lower  quadrant. No bowel wall thickening or inflammatory change. Vascular/Lymphatic: Aortic atherosclerosis. No enlarged abdominal or pelvic lymph nodes. Reproductive: Uterus and bilateral adnexa are unremarkable. Calcified uterine fibroid again noted. Other: No free fluid or free intraperitoneal gas. Fat containing left inguinal hernia. No bowel herniation. Musculoskeletal: No acute or destructive bony lesions. Postsurgical changes proximal left femur. Reconstructed images demonstrate no additional findings. IMPRESSION: 1. Small bilateral pleural effusions, left greater than right. 2. Moderate pericardial effusion along the posterior aspect of the left ventricle. This may be loculated. 3. Cholelithiasis without cholecystitis. 4. Bilateral nonobstructing renal calculi. 5. Distal colonic diverticulosis without diverticulitis. 6.  Aortic Atherosclerosis (ICD10-I70.0). Electronically Signed   By: Randa Ngo M.D.   On: 03/06/2021 23:47   CT HEAD WO CONTRAST (5MM)  Result Date: 03/06/2021 CLINICAL DATA:  Delirium EXAM: CT HEAD WITHOUT CONTRAST TECHNIQUE: Contiguous axial images were obtained from the base of the skull through the vertex without  intravenous contrast. COMPARISON:  Head CT 10/19/2020 FINDINGS: Brain: There is no mass, hemorrhage or extra-axial collection. There is generalized atrophy without lobar predilection. Hypodensity of the white matter is most commonly associated with chronic microvascular disease. Unchanged calcified mass in the right posterior fossa likely a meningioma. Unchanged focus of encephalomalacia in the right parietal lobe. Vascular: Atherosclerotic calcification of the internal carotid arteries at the skull base. No abnormal hyperdensity of the major intracranial arteries or dural venous sinuses. Skull: The visualized skull base, calvarium and extracranial soft tissues are normal. Sinuses/Orbits: No fluid levels or advanced mucosal thickening of the visualized paranasal sinuses. No mastoid or middle ear effusion. The orbits are normal. IMPRESSION: 1. No acute intracranial abnormality. 2. Unchanged right posterior fossa meningioma. 3. Chronic microvascular ischemia and generalized atrophy. Electronically Signed   By: Ulyses Jarred M.D.   On: 03/06/2021 23:56   DG Chest Portable 1 View  Result Date: 03/06/2021 CLINICAL DATA:  Fever. EXAM: PORTABLE CHEST 1 VIEW COMPARISON:  March 30, 2020 FINDINGS: Moderate severity left basilar atelectasis and/or infiltrate is seen. There is a small left pleural effusion. No pneumothorax is identified. The cardiac silhouette is normal in size. Right-sided tracheal deviation is seen. A radiopaque vascular stent is noted within the neck soft tissues on the left. Degenerative changes are seen throughout the thoracic spine. IMPRESSION: 1. Moderate severity left basilar atelectasis and/or infiltrate. 2. Small left pleural effusion. Electronically Signed   By: Virgina Norfolk M.D.   On: 03/06/2021 22:50   VAS [place] ABI WITH/WO TBI  Result Date: 02/22/2021  LOWER EXTREMITY DOPPLER STUDY Patient Name:  [name]  Date of Exam:   02/14/2021 Medical Rec #: [id number]      Accession #:     [id number] Date of Birth: 1940/10/27       Patient Gender: F Patient Age:   53Y Exam Location:  [facility] Procedure:      VAS [place] ABI WITH/WO TBI Referring Phys: [id number] [place] --------------------------------------------------------------------------------  Indications: Peripheral artery disease.  Vascular Interventions: 06/08/2019: PTA Left SFA and Popliteal Artery. PTA Left                         ATA.                          04/03/2020: Aortogram and Selective Left Lower Extremity  Angiogram. Mechanical Thrombectomy of the Left SFA and                         Popliteal Artery. PTA of the Left ATA. PTA of the Left                         SFA and Popliteal Artery.                          10/20/2020: PTA and Stent placement of the Right SFA and                         Popliteal Artery. PTA of the Right ATTA.                          01/23/2021:PTA and Stent placement Left SFA and                         Popliteal Artery. PTA of the Left Anterior Tibial                         Artery. Mechanical Thrombectomy with the penumbra CAT 6                         device left SFA, Popliteal Artery and Anterior Tibial                         Artery. Comparison Study: 01/19/2021 Performing Technologist: Almira Coaster RVS  Examination Guidelines: A complete evaluation includes at minimum, Doppler waveform signals and systolic blood pressure reading at the level of bilateral brachial, anterior tibial, and posterior tibial arteries, when vessel segments are accessible. Bilateral testing is considered an integral part of a complete examination. Photoelectric Plethysmograph (PPG) waveforms and toe systolic pressure readings are included as required and additional duplex testing as needed. Limited examinations for reoccurring indications may be performed as noted.  ABI Findings: +--------+------------------+-----+----------+--------+ Right   Rt Pressure (mmHg)IndexWaveform   Comment  +--------+------------------+-----+----------+--------+ [id number]                                       +--------+------------------+-----+----------+--------+ ATA     92                0.63 monophasic         +--------+------------------+-----+----------+--------+ PTA     0                 0.00 monophasic         +--------+------------------+-----+----------+--------+ +---------+------------------+-----+----------+-------+ Left     Lt Pressure (mmHg)IndexWaveform  Comment +---------+------------------+-----+----------+-------+ Brachial 136                                      +---------+------------------+-----+----------+-------+ ATA      130               0.88 monophasic        +---------+------------------+-----+----------+-------+ PTA      141               0.96 monophasic        +---------+------------------+-----+----------+-------+  Great Toe108               0.73                   +---------+------------------+-----+----------+-------+ +-------+-----------+-----------+------------+------------+ ABI/TBIToday's ABIToday's TBIPrevious ABIPrevious TBI +-------+-----------+-----------+------------+------------+ Right  .63        0          .75         0.0          +-------+-----------+-----------+------------+------------+ Left   .96        .73        0.0         0.0          +-------+-----------+-----------+------------+------------+ Left ABIs and TBIs appear increased compared to prior study on 01/19/2021. Right ABIs appear decreased compared to prior study on 01/19/2021. Rt TBIs appear essentially unchanged compared to prior study on 01/19/2021.  Summary: Right: Resting right ankle-brachial index indicates moderate right lower extremity arterial disease. The right toe-brachial index is abnormal. Left: Resting left ankle-brachial index is within normal range. No evidence of significant left lower extremity arterial disease. The left  toe-brachial index is normal.  *See table(s) above for measurements and observations.  Electronically signed by Hortencia Pilar MD on 02/22/2021 at 5:10:38 PM.    Final    ECHOCARDIOGRAM COMPLETE  Result Date: 03/08/2021    ECHOCARDIOGRAM REPORT   Patient Name:   [name] Colarusso Date of Exam: 03/08/2021 Medical Rec #:  [id number]     Height:       68.0 in Accession #:    [id number]    Weight:       144.6 lb Date of Birth:  Sep 14, 1940      BSA:          1.781 m Patient Age:    45 years      BP:           137/52 mmHg Patient Gender: F             HR:           77 bpm. Exam Location:  [facility] Procedure: 2D Echo, Cardiac Doppler and Color Doppler Indications:    Pericardial Effusion  History:        Patient has prior history of Echocardiogram examinations, most                 recent 12/24/2018. CHF, CAD, Stroke, Aortic Valve Disease,                 Arrythmias:LBBB; Risk Factors:Hypertension and Diabetes. COVID                 +.  Sonographer:    Wenda Low Referring Phys: Rice  1. Left ventricular ejection fraction, by estimation, is 70 to 75%. The left ventricle has hyperdynamic function. The left ventricle has no regional wall motion abnormalities. There is severe asymmetric left ventricular hypertrophy of the septal segment. No significant LVOT gradient. Left ventricular diastolic parameters are consistent with Grade I diastolic dysfunction (impaired relaxation).  2. RV-RA gradient normal at 16 mmHg. Right ventricular systolic function is normal. The right ventricular size is normal.  3. Left atrial size was mildly dilated.  4. The mitral valve is abnormal, mildly thickened and calcified. Trivial mitral valve regurgitation. The mean mitral valve gradient is 3.0 mmHg. Moderate mitral annular calcification.  5. The aortic valve is tricuspid. There is moderate calcification of the aortic valve. Aortic  valve regurgitation is not visualized. Moderate aortic valve stenosis. Aortic valve  area, by VTI measures 1.43 cm. Aortic valve mean gradient measures 9.5 mmHg. Dimentionless index 0.45.  6. Unable to estimate CVP. Comparison(s): Prior images reviewed side by side. Aortic valve gradients measure less, but still overall consistent with moderate aortic stenosis. FINDINGS  Left Ventricle: Left ventricular ejection fraction, by estimation, is 70 to 75%. The left ventricle has hyperdynamic function. The left ventricle has no regional wall motion abnormalities. The left ventricular internal cavity size was small. There is severe asymmetric left ventricular hypertrophy of the septal segment. Left ventricular diastolic parameters are consistent with Grade I diastolic dysfunction (impaired relaxation). Right Ventricle: RV-RA gradient normal at 16 mmHg. The right ventricular size is normal. No increase in right ventricular wall thickness. Right ventricular systolic function is normal. Left Atrium: Left atrial size was mildly dilated. Right Atrium: Right atrial size was normal in size. Pericardium: There is no evidence of pericardial effusion. Presence of pericardial fat pad. Mitral Valve: The mitral valve is abnormal. There is mild thickening of the mitral valve leaflet(s). There is mild calcification of the mitral valve leaflet(s). Moderate mitral annular calcification. Trivial mitral valve regurgitation. MV peak gradient, 7.5 mmHg. The mean mitral valve gradient is 3.0 mmHg. Tricuspid Valve: The tricuspid valve is grossly normal. Tricuspid valve regurgitation is trivial. Aortic Valve: The aortic valve is tricuspid. There is moderate calcification of the aortic valve. There is mild aortic valve annular calcification. Aortic valve regurgitation is not visualized. Moderate aortic stenosis is present. Aortic valve mean gradient measures 9.5 mmHg. Aortic valve peak gradient measures 20.0 mmHg. Aortic valve area, by VTI measures 1.43 cm. Pulmonic Valve: The pulmonic valve was grossly normal. Pulmonic valve  regurgitation is trivial. Aorta: The aortic root is normal in size and structure. Venous: Unable to estimate CVP. The inferior vena cava was not well visualized. IAS/Shunts: No atrial level shunt detected by color flow Doppler.  LEFT VENTRICLE PLAX 2D LVIDd:         2.99 cm  Diastology LVIDs:         1.73 cm  LV e' medial:    3.30 cm/s LV PW:         1.50 cm  LV E/e' medial:  19.9 LV IVS:        1.93 cm  LV e' lateral:   3.44 cm/s LVOT diam:     2.00 cm  LV E/e' lateral: 19.1 LV SV:         63 LV SV Index:   35 LVOT Area:     3.14 cm  RIGHT VENTRICLE RV Basal diam:  2.65 cm RV Mid diam:    1.94 cm RV S prime:     13.80 cm/s TAPSE (M-mode): 2.5 cm LEFT ATRIUM             Index       RIGHT ATRIUM           Index LA diam:        3.90 cm 2.19 cm/m  RA Area:     11.20 cm LA Vol (A2C):   79.3 ml 44.53 ml/m RA Volume:   17.90 ml  10.05 ml/m LA Vol (A4C):   52.8 ml 29.65 ml/m LA Biplane Vol: 66.7 ml 37.46 ml/m  AORTIC VALVE AV Area (Vmax):    1.25 cm AV Area (Vmean):   1.29 cm AV Area (VTI):     1.43 cm AV Vmax:  223.50 cm/s AV Vmean:          133.500 cm/s AV VTI:            0.440 m AV Peak Grad:      20.0 mmHg AV Mean Grad:      9.5 mmHg LVOT Vmax:         88.70 cm/s LVOT Vmean:        54.700 cm/s LVOT VTI:          0.200 m LVOT/AV VTI ratio: 0.45  AORTA Ao Root diam: 2.80 cm Ao Asc diam:  2.80 cm MITRAL VALVE                TRICUSPID VALVE MV Area (PHT): 2.28 cm     TR Peak grad:   16.5 mmHg MV Area VTI:   2.07 cm     TR Vmax:        203.00 cm/s MV Peak grad:  7.5 mmHg MV Mean grad:  3.0 mmHg     SHUNTS MV Vmax:       1.37 m/s     Systemic VTI:  0.20 m MV Vmean:      74.5 cm/s    Systemic Diam: 2.00 cm MV Decel Time: 333 msec MV E velocity: 65.60 cm/s MV A velocity: 111.00 cm/s MV E/A ratio:  0.59 Rozann Lesches MD Electronically signed by Rozann Lesches MD Signature Date/Time: 03/08/2021/3:20:14 PM    Final       Subjective: Patient is feeling better. Says pain in her right foot better after  tramadol  Discharge Exam: Vitals:   03/09/21 2046 03/10/21 0000 03/10/21 0400 03/10/21 0720  BP: (!) 155/53 135/62 130/70   Pulse: 78 70 72   Resp: '20 16 14   '$ Temp: 98.2 F (36.8 C) 98 F (36.7 C) 98.6 F (37 C)   TempSrc: Oral Oral Oral   SpO2: 99% 99% 98% 97%  Weight:      Height:        General: Pt is alert, awake, not in acute distress Cardiovascular: RRR, S1/S2 +, no rubs, no gallops Respiratory: CTA bilaterally, no wheezing, no rhonchi Abdominal: Soft, NT, ND, bowel sounds + Extremities: right foot discolored around toes    The results of significant diagnostics from this hospitalization (including imaging, microbiology, ancillary and laboratory) are listed below for reference.     Microbiology: Recent Results (from the past 240 hour(s))  Resp Panel by RT-PCR (Flu A&B, Covid) Nasopharyngeal Swab     Status: Abnormal   Collection Time: 03/06/21 10:15 PM   Specimen: Nasopharyngeal Swab; Nasopharyngeal(NP) swabs in vial transport medium  Result Value Ref Range Status   SARS Coronavirus 2 by RT PCR POSITIVE (A) NEGATIVE Final    Comment: RESULT CALLED TO, READ BACK BY AND VERIFIED WITH: PRUITT,G @ 0035 ON 03/07/21 BY JUW (NOTE) SARS-CoV-2 target nucleic acids are DETECTED.  The SARS-CoV-2 RNA is generally detectable in upper respiratory specimens during the acute phase of infection. Positive results are indicative of the presence of the identified virus, but do not rule out bacterial infection or co-infection with other pathogens not detected by the test. Clinical correlation with patient history and other diagnostic information is necessary to determine patient infection status. The expected result is Negative.  Fact Sheet for Patients: EntrepreneurPulse.com.au  Fact Sheet for Healthcare Providers: IncredibleEmployment.be  This test is not yet approved or cleared by the [place] FDA and  has been authorized for detection  and/or diagnosis of SARS-CoV-2  by FDA under an Emergency Use Authorization (EUA).  This EUA will remain in effect (meaning this test can be  used) for the duration of  the COVID-19 declaration under Section 564(b)(1) of the Act, 21 U.S.C. section 360bbb-3(b)(1), unless the authorization is terminated or revoked sooner.     Influenza A by PCR NEGATIVE NEGATIVE Final   Influenza B by PCR NEGATIVE NEGATIVE Final    Comment: (NOTE) The Xpert Xpress SARS-CoV-2/FLU/RSV plus assay is intended as an aid in the diagnosis of influenza from Nasopharyngeal swab specimens and should not be used as a sole basis for treatment. Nasal washings and aspirates are unacceptable for Xpert Xpress SARS-CoV-2/FLU/RSV testing.  Fact Sheet for Patients: EntrepreneurPulse.com.au  Fact Sheet for Healthcare Providers: IncredibleEmployment.be  This test is not yet approved or cleared by the [place] FDA and has been authorized for detection and/or diagnosis of SARS-CoV-2 by FDA under an Emergency Use Authorization (EUA). This EUA will remain in effect (meaning this test can be used) for the duration of the COVID-19 declaration under Section 564(b)(1) of the Act, 21 U.S.C. section 360bbb-3(b)(1), unless the authorization is terminated or revoked.  Performed at [facility], [address], [place], [place] [address]   Culture, blood (Routine x 2)     Status: None (Preliminary result)   Collection Time: 03/06/21 10:38 PM   Specimen: BLOOD RIGHT ARM  Result Value Ref Range Status   Specimen Description BLOOD RIGHT ARM  Final   Special Requests   Final    Blood Culture adequate volume BOTTLES DRAWN AEROBIC AND ANAEROBIC   Culture   Final    NO GROWTH 4 DAYS Performed at [facility], [address], [place], [place] [address]    Report Status PENDING  Incomplete  Culture, blood (Routine x 2)     Status: None (Preliminary result)   Collection Time: 03/06/21 10:38 PM    Specimen: BLOOD LEFT ARM  Result Value Ref Range Status   Specimen Description BLOOD LEFT ARM  Final   Special Requests   Final    Blood Culture results may not be optimal due to an excessive volume of blood received in culture bottles BOTTLES DRAWN AEROBIC AND ANAEROBIC   Culture   Final    NO GROWTH 4 DAYS Performed at [facility], [address] [place] [address] [place], [place] [address]    Report Status PENDING  Incomplete  Urine Culture     Status: Abnormal (Preliminary result)   Collection Time: 03/07/21  2:01 AM   Specimen: In/Out Cath Urine  Result Value Ref Range Status   Specimen Description   Final    IN/OUT CATH URINE Performed at [facility], [address], [place], [place] [address]    Special Requests   Final    NONE Performed at [facility], [address], [place], [place] [address]    Culture (A)  Final    >=100,000 COLONIES/mL ESCHERICHIA COLI 10,000 COLONIES/mL PROTEUS MIRABILIS SUSCEPTIBILITIES TO FOLLOW Performed at [facility], [place] [address], [place], [place] [address]    Report Status PENDING  Incomplete     Labs: BNP (last 3 results) Recent Labs    04/01/20 0615  BNP 123456*   Basic Metabolic Panel: Recent Labs  Lab 03/06/21 2238 03/08/21 0518 03/09/21 0523 03/10/21 0629  NA 136 137 136 141  K 4.2 3.5 3.3* 3.4*  CL 105 104 102 107  CO2 '24 23 25 27  '$ GLUCOSE 192* 134* 177* 151*  BUN 15 15  17 15  CREATININE 0.61 0.52 0.51 0.45  CALCIUM 8.8* 8.7* 8.6* 8.7*  MG  --  2.1 2.1 2.1  PHOS  --  3.1 2.4* 1.7*   Liver Function Tests: Recent Labs  Lab 03/06/21 2238 03/08/21 0518 03/09/21 0523 03/10/21 0629  AST 28 21 40 32  ALT '23 17 24 25  '$ ALKPHOS 58 55 50 45  BILITOT 0.5 0.8 0.7 0.6  PROT 6.5 5.8* 5.6* 5.3*  ALBUMIN 3.1* 2.7* 2.7* 2.6*   No results for input(s): LIPASE, AMYLASE in the last 168 hours. Recent Labs  Lab 03/07/21 1052  AMMONIA 22   CBC: Recent Labs  Lab 03/06/21 2238 03/08/21 0518 03/09/21 0523 03/10/21 0629  WBC  6.4 9.6 4.1 4.3  NEUTROABS 4.2 7.6 2.4 2.4  HGB 10.5* 10.8* 10.5* 9.8*  HCT 33.7* 35.2* 34.1* 31.4*  MCV 83.0 84.4 82.4 81.6  PLT 179 143* 135* 148*   Cardiac Enzymes: No results for input(s): CKTOTAL, CKMB, CKMBINDEX, TROPONINI in the last 168 hours. BNP: Invalid input(s): POCBNP CBG: Recent Labs  Lab 03/09/21 1123 03/09/21 1618 03/09/21 2043 03/10/21 0744 03/10/21 1121  GLUCAP 158* 168* 111* 151* 161*   D-Dimer Recent Labs    03/09/21 0523 03/10/21 0629  DDIMER 0.63* 0.59*   Hgb A1c No results for input(s): HGBA1C in the last 72 hours. Lipid Profile No results for input(s): CHOL, HDL, LDLCALC, TRIG, CHOLHDL, LDLDIRECT in the last 72 hours. Thyroid function studies No results for input(s): TSH, T4TOTAL, T3FREE, THYROIDAB in the last 72 hours.  Invalid input(s): FREET3 Anemia work up Recent Labs    03/09/21 0523 03/10/21 0629  FERRITIN 52 53   Urinalysis    Component Value Date/Time   COLORURINE YELLOW 03/07/2021 0201   APPEARANCEUR HAZY (A) 03/07/2021 0201   LABSPEC 1.009 03/07/2021 0201   PHURINE 6.0 03/07/2021 0201   GLUCOSEU NEGATIVE 03/07/2021 0201   HGBUR NEGATIVE 03/07/2021 0201   BILIRUBINUR NEGATIVE 03/07/2021 0201   KETONESUR NEGATIVE 03/07/2021 0201   PROTEINUR NEGATIVE 03/07/2021 0201   UROBILINOGEN 0.2 02/12/2014 0110   NITRITE NEGATIVE 03/07/2021 0201   LEUKOCYTESUR LARGE (A) 03/07/2021 0201   Sepsis Labs Invalid input(s): PROCALCITONIN,  WBC,  LACTICIDVEN Microbiology Recent Results (from the past 240 hour(s))  Resp Panel by RT-PCR (Flu A&B, Covid) Nasopharyngeal Swab     Status: Abnormal   Collection Time: 03/06/21 10:15 PM   Specimen: Nasopharyngeal Swab; Nasopharyngeal(NP) swabs in vial transport medium  Result Value Ref Range Status   SARS Coronavirus 2 by RT PCR POSITIVE (A) NEGATIVE Final    Comment: RESULT CALLED TO, READ BACK BY AND VERIFIED WITH: PRUITT,G @ 0035 ON 03/07/21 BY JUW (NOTE) SARS-CoV-2 target nucleic acids are  DETECTED.  The SARS-CoV-2 RNA is generally detectable in upper respiratory specimens during the acute phase of infection. Positive results are indicative of the presence of the identified virus, but do not rule out bacterial infection or co-infection with other pathogens not detected by the test. Clinical correlation with patient history and other diagnostic information is necessary to determine patient infection status. The expected result is Negative.  Fact Sheet for Patients: EntrepreneurPulse.com.au  Fact Sheet for Healthcare Providers: IncredibleEmployment.be  This test is not yet approved or cleared by the [place] FDA and  has been authorized for detection and/or diagnosis of SARS-CoV-2 by FDA under an Emergency Use Authorization (EUA).  This EUA will remain in effect (meaning this test can be  used) for the duration of  the COVID-19 declaration under  Section 564(b)(1) of the Act, 21 U.S.C. section 360bbb-3(b)(1), unless the authorization is terminated or revoked sooner.     Influenza A by PCR NEGATIVE NEGATIVE Final   Influenza B by PCR NEGATIVE NEGATIVE Final    Comment: (NOTE) The Xpert Xpress SARS-CoV-2/FLU/RSV plus assay is intended as an aid in the diagnosis of influenza from Nasopharyngeal swab specimens and should not be used as a sole basis for treatment. Nasal washings and aspirates are unacceptable for Xpert Xpress SARS-CoV-2/FLU/RSV testing.  Fact Sheet for Patients: EntrepreneurPulse.com.au  Fact Sheet for Healthcare Providers: IncredibleEmployment.be  This test is not yet approved or cleared by the [place] FDA and has been authorized for detection and/or diagnosis of SARS-CoV-2 by FDA under an Emergency Use Authorization (EUA). This EUA will remain in effect (meaning this test can be used) for the duration of the COVID-19 declaration under Section 564(b)(1) of the Act, 21  U.S.C. section 360bbb-3(b)(1), unless the authorization is terminated or revoked.  Performed at [facility], [address], [place], [place] [address]   Culture, blood (Routine x 2)     Status: None (Preliminary result)   Collection Time: 03/06/21 10:38 PM   Specimen: BLOOD RIGHT ARM  Result Value Ref Range Status   Specimen Description BLOOD RIGHT ARM  Final   Special Requests   Final    Blood Culture adequate volume BOTTLES DRAWN AEROBIC AND ANAEROBIC   Culture   Final    NO GROWTH 4 DAYS Performed at [facility], [address], [place], [place] [address]    Report Status PENDING  Incomplete  Culture, blood (Routine x 2)     Status: None (Preliminary result)   Collection Time: 03/06/21 10:38 PM   Specimen: BLOOD LEFT ARM  Result Value Ref Range Status   Specimen Description BLOOD LEFT ARM  Final   Special Requests   Final    Blood Culture results may not be optimal due to an excessive volume of blood received in culture bottles BOTTLES DRAWN AEROBIC AND ANAEROBIC   Culture   Final    NO GROWTH 4 DAYS Performed at [facility], [address], [place], [place] [address]    Report Status PENDING  Incomplete  Urine Culture     Status: Abnormal (Preliminary result)   Collection Time: 03/07/21  2:01 AM   Specimen: In/Out Cath Urine  Result Value Ref Range Status   Specimen Description   Final    IN/OUT CATH URINE Performed at [facility], [address], [place], [place] [address]    Special Requests   Final    NONE Performed at [facility], [address], [place], [place] [address]    Culture (A)  Final    >=100,000 COLONIES/mL ESCHERICHIA COLI 10,000 COLONIES/mL PROTEUS MIRABILIS SUSCEPTIBILITIES TO FOLLOW Performed at [facility], [place] [address] [place], [place] [address]    Report Status PENDING  Incomplete     Time coordinating discharge: 16mns  SIGNED:   JKathie Dike MD  [facility] 03/10/2021, 11:53 AM   If 7PM-7AM, please contact  night-coverage [email or web address]

## 2021-03-10 NOTE — TOC Transition Note (Signed)
Transition of Care [facility]) - CM/SW Discharge Note   Patient Details  Name: LAURETHA STEINMAN MRN: [id number] Date of Birth: 1941/02/19  Transition of Care [facility]) CM/SW Contact:  Natasha Bence, LCSW Phone Number: 03/10/2021, 12:22 PM   Clinical Narrative:    CSW notified of patient's readiness for discharge. CSW referred patient to Georgina Snell with [facility] for [facility]. Georgina Snell agreeable to provide services to the patient. CSW completed med necessity. Nurse secretary to call EMS. TOC signing off.    Final next level of care: [place] Barriers to Discharge: Barriers Resolved   Patient Goals and CMS Choice Patient states their goals for this hospitalization and ongoing recovery are:: Return home with [facility] CMS Medicare.gov Compare Post Acute Care list provided to:: Patient Choice offered to / list presented to : Patient  Discharge Placement                    Patient and family notified of of transfer: 03/10/21  Discharge Plan and Services                          HH Arranged: RN [facility] Agency: [facility]        Social Determinants of Health (SDOH) Interventions     Readmission Risk Interventions Readmission Risk Prevention Plan 12/24/2018  [place] or Home Care Consult Complete  Some recent data might be hidden

## 2021-03-11 LAB — CULTURE, BLOOD (ROUTINE X 2)
Culture: NO GROWTH
Culture: NO GROWTH
Special Requests: ADEQUATE

## 2021-03-12 ENCOUNTER — Ambulatory Visit: Payer: Medicare HMO

## 2021-03-14 ENCOUNTER — Ambulatory Visit: Payer: Medicare HMO

## 2021-03-19 ENCOUNTER — Ambulatory Visit: Payer: Medicare HMO

## 2021-03-21 ENCOUNTER — Ambulatory Visit: Payer: Medicare HMO

## 2021-03-23 ENCOUNTER — Other Ambulatory Visit (INDEPENDENT_AMBULATORY_CARE_PROVIDER_SITE_OTHER): Payer: Self-pay | Admitting: Nurse Practitioner

## 2021-03-23 DIAGNOSIS — I739 Peripheral vascular disease, unspecified: Secondary | ICD-10-CM

## 2021-03-26 ENCOUNTER — Ambulatory Visit: Payer: Medicare HMO

## 2021-03-28 ENCOUNTER — Ambulatory Visit: Payer: Medicare HMO

## 2021-03-28 NOTE — H&P (View-Only) (Signed)
MRN : [id number]  [name] is a 80 y.o. (21-Apr-1941) female who presents with chief complaint of leg blockages.  History of Present Illness:   The patient returns to the office for followup and review status post angiogram with intervention 01/23/2021 of the left leg.  She is s/p intervention of the right leg in 10/2020.   Procedure 01/2021: Percutaneous transluminal angioplasty and stent placement left superficial femoral and popliteal arteries to 4 to 5 mm Percutaneous transluminal angioplasty left anterior tibial to 3 mm Mechanical thrombectomy with the penumbra CAT 6 device left superficial femoral, popliteal and anterior tibial arteries.    The patient notes severe worsening of the right leg symptoms and increased rest pain.  Several areas of the right foot look more ischemic.   The patient's husband provides much of the history.     There have been no significant changes to the patient's overall health care.   The patient denies amaurosis fugax or recent TIA symptoms. There are no recent neurological changes noted. The patient denies history of DVT, PE or superficial thrombophlebitis. The patient denies recent episodes of angina or shortness of breath.    ABI's Rt=0.00 and Lt=1.09 (previous ABI's Rt=0.66 and Lt=0.96). Significant deterioration of the right leg   No outpatient medications have been marked as taking for the 03/29/21 encounter (Appointment) with Delana Meyer, Dolores Lory, MD.    Past Medical History:  Diagnosis Date   Acute heart failure [facility])    Aortic stenosis    CHF (congestive heart failure) ([place])    Complication of anesthesia    Hard to wake patient up after having anesthesia   Diabetes mellitus without complication ([place])    Diabetic neuropathy ([place])    Takes Lyrica   Edema of both legs    Takes Lasix   GERD (gastroesophageal reflux disease)    High cholesterol    HTN (hypertension)    Hypertension    PAD (peripheral artery disease) ([facility])     Shortness of breath dyspnea    Spasm of back muscles    Stroke ([place])    Wound, open    Right great toe    Past Surgical History:  Procedure Laterality Date   AMPUTATION TOE Left 06/09/2019   Procedure: Left third toe and partial great toe amputation and debridement;  Surgeon: Katha Cabal, MD;  Location: [facility];  Service: Vascular;  Laterality: Left;   AMPUTATION TOE Left 04/06/2020   Procedure: AMPUTATION LEFT SECOND TOE;  Surgeon: Samara Deist, DPM;  Location: [facility];  Service: Podiatry;  Laterality: Left;   BYPASS GRAFT POPLITEAL TO TIBIAL Right 02/28/2016   Procedure: BYPASS GRAFT RIGHT BELOW KNEE POPLITEAL TO PERONEAL USING REVERSED RIGHT GREATER SAPHENOUS VEIN;  Surgeon: Conrad [place], MD;  Location: [place];  Service: Vascular;  Laterality: Right;   CYST EXCISION     Abdomen   EYE SURGERY Bilateral    Cataract removal   INTRAMEDULLARY (IM) NAIL INTERTROCHANTERIC Left 02/04/2014   Procedure: INTRAMEDULLARY (IM) NAIL INTERTROCHANTRIC FEMORAL;  Surgeon: Mauri Pole, MD;  Location: [place];  Service: Orthopedics;  Laterality: Left;   IR GENERIC HISTORICAL  03/01/2016   IR ANGIO INTRA EXTRACRAN SEL COM CAROTID INNOMINATE UNI R MOD SED 03/01/2016 [name], MD [facility]   IR GENERIC HISTORICAL  03/01/2016   IR ENDOVASC INTRACRANIAL INF OTHER THAN THROMBO ART INC DIAG ANGIO 03/01/2016 [name], MD [facility]   IR GENERIC HISTORICAL  03/01/2016   IR INTRAVSC  STENT CERV CAROTID W/O EMB-PROT MOD SED INC ANGIO 03/01/2016 [name], MD [facility]   IR GENERIC HISTORICAL  06/03/2016   IR RADIOLOGIST EVAL & MGMT 06/03/2016 [facility]   LOWER EXTREMITY ANGIOGRAPHY Left 02/09/2019   Procedure: LOWER EXTREMITY ANGIOGRAPHY;  Surgeon: Katha Cabal, MD;  Location: [place] [facility];  Service: Cardiovascular;  Laterality: Left;   LOWER EXTREMITY ANGIOGRAPHY Left 03/10/2019   Procedure: LOWER EXTREMITY ANGIOGRAPHY;  Surgeon: Katha Cabal, MD;  Location:  [place] [facility];  Service: Cardiovascular;  Laterality: Left;   LOWER EXTREMITY ANGIOGRAPHY Left 04/27/2019   Procedure: LOWER EXTREMITY ANGIOGRAPHY;  Surgeon: Katha Cabal, MD;  Location: [place] [facility];  Service: Cardiovascular;  Laterality: Left;   LOWER EXTREMITY ANGIOGRAPHY Left 06/08/2019   Procedure: Lower Extremity Angiography;  Surgeon: Katha Cabal, MD;  Location: [place] [facility];  Service: Cardiovascular;  Laterality: Left;   LOWER EXTREMITY ANGIOGRAPHY Left 04/03/2020   Procedure: Lower Extremity Angiography;  Surgeon: Algernon Huxley, MD;  Location: [place] [facility];  Service: Cardiovascular;  Laterality: Left;   LOWER EXTREMITY ANGIOGRAPHY Right 10/20/2020   Procedure: Lower Extremity Angiography;  Surgeon: Katha Cabal, MD;  Location: [place] [facility];  Service: Cardiovascular;  Laterality: Right;   LOWER EXTREMITY ANGIOGRAPHY Left 01/23/2021   Procedure: LOWER EXTREMITY ANGIOGRAPHY;  Surgeon: Katha Cabal, MD;  Location: [place] [facility];  Service: Cardiovascular;  Laterality: Left;   ORIF TOE FRACTURE Right 02/08/2014   Procedure: OPEN REDUCTION INTERNAL FIXATION Right METATARSAL  FRACTURE ;  Surgeon: Wylene Simmer, MD;  Location: [place];  Service: Orthopedics;  Laterality: Right;   PERIPHERAL VASCULAR CATHETERIZATION N/A 12/28/2015   Procedure: Abdominal Aortogram w/Lower Extremity;  Surgeon: Conrad Brewer, MD;  Location: [place] [facility];  Service: Cardiovascular;  Laterality: N/A;   RADIOLOGY WITH ANESTHESIA N/A 03/01/2016   Procedure: RADIOLOGY WITH ANESTHESIA;  Surgeon: [name], MD;  Location: [place];  Service: Radiology;  Laterality: N/A;   VEIN HARVEST Right 02/28/2016   Procedure: RIGHT GREATER SAPHENOUS VEIN HARVEST;  Surgeon: Conrad Corley, MD;  [facility];  Service: Vascular;  Laterality: Right;    Social History Social History   Tobacco Use   Smoking status: Never   Smokeless tobacco: Never   Tobacco comments:     Never smoked  Substance Use Topics   Alcohol use: No    Alcohol/week: 0.0 standard drinks   Drug use: No    Family History Family History  Problem Relation Age of Onset   Diabetes Other    Liver disease Mother    CVA Father    Diabetes Father    Hypertension Father     Allergies  Allergen Reactions   Iodine Anaphylaxis   Penicillins Anaphylaxis    Tolerates ceftriaxone, cefazolin Did it involve swelling of the face/tongue/throat, SOB, or low BP? Unknown Did it involve sudden or severe rash/hives, skin peeling, or any reaction on the inside of your mouth or nose? Unknown Did you need to seek medical attention at a hospital or doctor's office? Unknown When did it last happen?      Unknown If all above answers are "NO", may proceed with cephalosporin use.    Shellfish Allergy Anaphylaxis   Sulfa Antibiotics Anaphylaxis   Sulfacetamide Sodium Anaphylaxis   Sulfasalazine Anaphylaxis   Eggs Or Egg-Derived Products     Other reaction(s): SHORTNESS OF BREATH   Morphine And Related Other (See Comments)  Altered mental status     REVIEW OF SYSTEMS (Negative unless checked)  Constitutional: '[]'$ Weight loss  '[]'$ Fever  '[]'$ Chills Cardiac: '[]'$ Chest pain   '[]'$ Chest pressure   '[]'$ Palpitations   '[]'$ Shortness of breath when laying flat   '[]'$ Shortness of breath with exertion. Vascular:  '[x]'$ Pain in legs with walking   '[x]'$ Pain in legs at rest  '[]'$ History of DVT   '[]'$ Phlebitis   '[]'$ Swelling in legs   '[]'$ Varicose veins   '[]'$ Non-healing ulcers Pulmonary:   '[]'$ Uses home oxygen   '[]'$ Productive cough   '[]'$ Hemoptysis   '[]'$ Wheeze  '[]'$ COPD   '[]'$ Asthma Neurologic:  '[]'$ Dizziness   '[]'$ Seizures   '[]'$ History of stroke   '[]'$ History of TIA  '[]'$ Aphasia   '[]'$ Vissual changes   '[]'$ Weakness or numbness in arm   '[]'$ Weakness or numbness in leg Musculoskeletal:   '[]'$ Joint swelling   '[]'$ Joint pain   '[]'$ Low back pain Hematologic:  '[]'$ Easy bruising  '[]'$ Easy bleeding   '[]'$ Hypercoagulable state   '[]'$ Anemic Gastrointestinal:  '[]'$ Diarrhea    '[]'$ Vomiting  '[]'$ Gastroesophageal reflux/heartburn   '[]'$ Difficulty swallowing. Genitourinary:  '[]'$ Chronic kidney disease   '[]'$ Difficult urination  '[]'$ Frequent urination   '[]'$ Blood in urine Skin:  '[]'$ Rashes   '[]'$ Ulcers  Psychological:  '[]'$ History of anxiety   '[]'$  History of major depression.  Physical Examination  There were no vitals filed for this visit. There is no height or weight on file to calculate BMI. Gen: WD/WN, Moderate distress  seen in a wheelchair Head: [place]/AT, No temporalis wasting.  Ear/Nose/Throat: Hearing grossly intact, nares w/o erythema or drainage Eyes: PER, EOMI, sclera nonicteric.  Neck: Supple, no masses.  No bruit or JVD.  Pulmonary:  Good air movement, no audible wheezing, no use of accessory muscles.  Cardiac: RRR, normal S1, S2, no Murmurs. Vascular:  ulcers of the right foot noted Vessel Right Left  Radial Palpable Palpable  PT Not Palpable Not Palpable  DP Not Palpable Not Palpable  Gastrointestinal: soft, non-distended. No guarding/no peritoneal signs.  Musculoskeletal: M/S 5/5 throughout.  No visible deformity.  Neurologic: CN 2-12 intact. Pain and light touch intact in extremities.  Symmetrical.  Speech is fluent. Motor exam as listed above. Psychiatric: Judgment intact, Mood & affect appropriate for pt's clinical situation. Dermatologic: No rashes + ulcers noted right.  No changes consistent with cellulitis.   CBC Lab Results  Component Value Date   WBC 4.3 03/10/2021   HGB 9.8 (L) 03/10/2021   HCT 31.4 (L) 03/10/2021   MCV 81.6 03/10/2021   PLT 148 (L) 03/10/2021    BMET    Component Value Date/Time   NA 141 03/10/2021 0629   K 3.4 (L) 03/10/2021 0629   CL 107 03/10/2021 0629   CO2 27 03/10/2021 0629   GLUCOSE 151 (H) 03/10/2021 0629   BUN 15 03/10/2021 0629   CREATININE 0.45 03/10/2021 0629   CALCIUM 8.7 (L) 03/10/2021 0629   GFRNONAA >60 03/10/2021 0629   GFRAA >60 04/06/2020 0535   CrCl cannot be calculated (Unknown ideal  weight.).  COAG Lab Results  Component Value Date   INR 1.1 03/06/2021   INR 1.0 10/19/2020   INR 1.1 03/31/2020    Radiology CT ABDOMEN PELVIS WO CONTRAST  Result Date: 03/06/2021 CLINICAL DATA:  Sepsis, febrile, unresponsive EXAM: CT ABDOMEN AND PELVIS WITHOUT CONTRAST TECHNIQUE: Multidetector CT imaging of the abdomen and pelvis was performed following the standard protocol without IV contrast. COMPARISON:  03/03/2016, 03/06/2021 FINDINGS: Lower chest: There are small bilateral pleural effusions, left greater than right. There is a left pericardial effusion  measuring up to 2.9 cm in thickness along the posterior wall of the left ventricle. This is increased since prior study. Dense calcification of the aortic and mitral valves. Hepatobiliary: Calcified gallstone is identified without cholecystitis. Unenhanced imaging of the liver demonstrates no focal abnormalities. Pancreas: Unremarkable. No pancreatic ductal dilatation or surrounding inflammatory changes. Spleen: Normal in size without focal abnormality. Adrenals/Urinary Tract: There are bilateral nonobstructing renal calculi, measuring 4 mm on the left and 2 mm on the right. Bladder is unremarkable. Stable nodularity of the left adrenal gland consistent with adenoma. The right adrenals unremarkable. Stomach/Bowel: No bowel obstruction or ileus. Moderate retained stool within the distal colon. There is distal colonic diverticulosis without diverticulitis. Normal appendix right lower quadrant. No bowel wall thickening or inflammatory change. Vascular/Lymphatic: Aortic atherosclerosis. No enlarged abdominal or pelvic lymph nodes. Reproductive: Uterus and bilateral adnexa are unremarkable. Calcified uterine fibroid again noted. Other: No free fluid or free intraperitoneal gas. Fat containing left inguinal hernia. No bowel herniation. Musculoskeletal: No acute or destructive bony lesions. Postsurgical changes proximal left femur. Reconstructed images  demonstrate no additional findings. IMPRESSION: 1. Small bilateral pleural effusions, left greater than right. 2. Moderate pericardial effusion along the posterior aspect of the left ventricle. This may be loculated. 3. Cholelithiasis without cholecystitis. 4. Bilateral nonobstructing renal calculi. 5. Distal colonic diverticulosis without diverticulitis. 6.  Aortic Atherosclerosis (ICD10-I70.0). Electronically Signed   By: Randa Ngo M.D.   On: 03/06/2021 23:47   CT HEAD WO CONTRAST (5MM)  Result Date: 03/06/2021 CLINICAL DATA:  [place] EXAM: CT HEAD WITHOUT CONTRAST TECHNIQUE: Contiguous axial images were obtained from the base of the skull through the vertex without intravenous contrast. COMPARISON:  Head CT 10/19/2020 FINDINGS: Brain: There is no mass, hemorrhage or extra-axial collection. There is generalized atrophy without lobar predilection. Hypodensity of the white matter is most commonly associated with chronic microvascular disease. Unchanged calcified mass in the right posterior fossa likely a meningioma. Unchanged focus of encephalomalacia in the right parietal lobe. Vascular: Atherosclerotic calcification of the internal carotid arteries at the skull base. No abnormal hyperdensity of the major intracranial arteries or dural venous sinuses. Skull: The visualized skull base, calvarium and extracranial soft tissues are normal. Sinuses/Orbits: No fluid levels or advanced mucosal thickening of the visualized paranasal sinuses. No mastoid or middle ear effusion. The orbits are normal. IMPRESSION: 1. No acute intracranial abnormality. 2. Unchanged right posterior fossa meningioma. 3. Chronic microvascular ischemia and generalized atrophy. Electronically Signed   By: Ulyses Jarred M.D.   On: 03/06/2021 23:56   DG Chest Portable 1 View  Result Date: 03/06/2021 CLINICAL DATA:  Fever. EXAM: PORTABLE CHEST 1 VIEW COMPARISON:  March 30, 2020 FINDINGS: Moderate severity left basilar atelectasis and/or  infiltrate is seen. There is a small left pleural effusion. No pneumothorax is identified. The cardiac silhouette is normal in size. Right-sided tracheal deviation is seen. A radiopaque vascular stent is noted within the neck soft tissues on the left. Degenerative changes are seen throughout the thoracic spine. IMPRESSION: 1. Moderate severity left basilar atelectasis and/or infiltrate. 2. Small left pleural effusion. Electronically Signed   By: Virgina Norfolk M.D.   On: 03/06/2021 22:50   ECHOCARDIOGRAM COMPLETE  Result Date: 03/08/2021    ECHOCARDIOGRAM REPORT   Patient Name:   RYENN CHESTERMAN Miera Date of Exam: 03/08/2021 Medical Rec #:  [id number]     Height:       68.0 in Accession #:    [id number]    Weight:  144.6 lb Date of Birth:  10-Sep-1940      BSA:          1.781 m Patient Age:    31 years      BP:           137/52 mmHg Patient Gender: F             HR:           77 bpm. Exam Location:  [facility] Procedure: 2D Echo, Cardiac Doppler and Color Doppler Indications:    Pericardial Effusion  History:        Patient has prior history of Echocardiogram examinations, most                 recent 12/24/2018. CHF, CAD, Stroke, Aortic Valve Disease,                 Arrythmias:LBBB; Risk Factors:Hypertension and Diabetes. COVID                 +.  Sonographer:    Wenda Low Referring Phys: [place]  1. Left ventricular ejection fraction, by estimation, is 70 to 75%. The left ventricle has hyperdynamic function. The left ventricle has no regional wall motion abnormalities. There is severe asymmetric left ventricular hypertrophy of the septal segment. No significant LVOT gradient. Left ventricular diastolic parameters are consistent with Grade I diastolic dysfunction (impaired relaxation).  2. RV-RA gradient normal at 16 mmHg. Right ventricular systolic function is normal. The right ventricular size is normal.  3. Left atrial size was mildly dilated.  4. The mitral valve is abnormal, mildly  thickened and calcified. Trivial mitral valve regurgitation. The mean mitral valve gradient is 3.0 mmHg. Moderate mitral annular calcification.  5. The aortic valve is tricuspid. There is moderate calcification of the aortic valve. Aortic valve regurgitation is not visualized. Moderate aortic valve stenosis. Aortic valve area, by VTI measures 1.43 cm. Aortic valve mean gradient measures 9.5 mmHg. Dimentionless index 0.45.  6. Unable to estimate CVP. Comparison(s): Prior images reviewed side by side. Aortic valve gradients measure less, but still overall consistent with moderate aortic stenosis. FINDINGS  Left Ventricle: Left ventricular ejection fraction, by estimation, is 70 to 75%. The left ventricle has hyperdynamic function. The left ventricle has no regional wall motion abnormalities. The left ventricular internal cavity size was small. There is severe asymmetric left ventricular hypertrophy of the septal segment. Left ventricular diastolic parameters are consistent with Grade I diastolic dysfunction (impaired relaxation). Right Ventricle: RV-RA gradient normal at 16 mmHg. The right ventricular size is normal. No increase in right ventricular wall thickness. Right ventricular systolic function is normal. Left Atrium: Left atrial size was mildly dilated. Right Atrium: Right atrial size was normal in size. Pericardium: There is no evidence of pericardial effusion. Presence of pericardial fat pad. Mitral Valve: The mitral valve is abnormal. There is mild thickening of the mitral valve leaflet(s). There is mild calcification of the mitral valve leaflet(s). Moderate mitral annular calcification. Trivial mitral valve regurgitation. MV peak gradient, 7.5 mmHg. The mean mitral valve gradient is 3.0 mmHg. Tricuspid Valve: The tricuspid valve is grossly normal. Tricuspid valve regurgitation is trivial. Aortic Valve: The aortic valve is tricuspid. There is moderate calcification of the aortic valve. There is mild aortic  valve annular calcification. Aortic valve regurgitation is not visualized. Moderate aortic stenosis is present. Aortic valve mean gradient measures 9.5 mmHg. Aortic valve peak gradient measures 20.0 mmHg. Aortic valve area, by VTI measures  1.43 cm. Pulmonic Valve: The pulmonic valve was grossly normal. Pulmonic valve regurgitation is trivial. Aorta: The aortic root is normal in size and structure. Venous: Unable to estimate CVP. The inferior vena cava was not well visualized. IAS/Shunts: No atrial level shunt detected by color flow Doppler.  LEFT VENTRICLE PLAX 2D LVIDd:         2.99 cm  Diastology LVIDs:         1.73 cm  LV e' medial:    3.30 cm/s LV PW:         1.50 cm  LV E/e' medial:  19.9 LV IVS:        1.93 cm  LV e' lateral:   3.44 cm/s LVOT diam:     2.00 cm  LV E/e' lateral: 19.1 LV SV:         63 LV SV Index:   35 LVOT Area:     3.14 cm  RIGHT VENTRICLE RV Basal diam:  2.65 cm RV Mid diam:    1.94 cm RV S prime:     13.80 cm/s TAPSE (M-mode): 2.5 cm LEFT ATRIUM             Index       RIGHT ATRIUM           Index LA diam:        3.90 cm 2.19 cm/m  RA Area:     11.20 cm LA Vol (A2C):   79.3 ml 44.53 ml/m RA Volume:   17.90 ml  10.05 ml/m LA Vol (A4C):   52.8 ml 29.65 ml/m LA Biplane Vol: 66.7 ml 37.46 ml/m  AORTIC VALVE AV Area (Vmax):    1.25 cm AV Area (Vmean):   1.29 cm AV Area (VTI):     1.43 cm AV Vmax:           223.50 cm/s AV Vmean:          133.500 cm/s AV VTI:            0.440 m AV Peak Grad:      20.0 mmHg AV Mean Grad:      9.5 mmHg LVOT Vmax:         88.70 cm/s LVOT Vmean:        54.700 cm/s LVOT VTI:          0.200 m LVOT/AV VTI ratio: 0.45  AORTA Ao Root diam: 2.80 cm Ao Asc diam:  2.80 cm MITRAL VALVE                TRICUSPID VALVE MV Area (PHT): 2.28 cm     TR Peak grad:   16.5 mmHg MV Area VTI:   2.07 cm     TR Vmax:        203.00 cm/s MV Peak grad:  7.5 mmHg MV Mean grad:  3.0 mmHg     SHUNTS MV Vmax:       1.37 m/s     Systemic VTI:  0.20 m MV Vmean:      74.5 cm/s     Systemic Diam: 2.00 cm MV Decel Time: 333 msec MV E velocity: 65.60 cm/s MV A velocity: 111.00 cm/s MV E/A ratio:  0.59 Rozann Lesches MD Electronically signed by Rozann Lesches MD Signature Date/Time: 03/08/2021/3:20:14 PM    Final      Assessment/Plan 1. 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  This represents a limb threatening ischemia and places the patient at the risk for right limb loss.  ABI's Rt=0.00 and Lt=1.09 (previous ABI's Rt=0.66 and Lt=0.96). Significant deterioration of the right leg  Patient should undergo angiography of the right lower extremity with the hope for intervention for limb salvage.  The risks and benefits as well as the alternative therapies was discussed in detail with the patient.  All questions were answered.  Patient agrees to proceed with right leg angiography.  The patient will follow up with me in the office after the procedure.   2. Primary hypertension Continue antihypertensive medications as already ordered, these medications have been reviewed and there are no changes at this time.   3. Chronic obstructive pulmonary disease, unspecified COPD type ([place]) Continue pulmonary medications and aerosols as already ordered, these medications have been reviewed and there are no changes at this time.      Hortencia Pilar, MD  03/28/2021 6:37 PM

## 2021-03-28 NOTE — Progress Notes (Signed)
MRN : [id number]  [name] is a 80 y.o. (18-Apr-1941) female who presents with chief complaint of leg blockages.  History of Present Illness:   The patient returns to the office for followup and review status post angiogram with intervention 01/23/2021 of the left leg.  She is s/p intervention of the right leg in 10/2020.   Procedure 01/2021: Percutaneous transluminal angioplasty and stent placement left superficial femoral and popliteal arteries to 4 to 5 mm Percutaneous transluminal angioplasty left anterior tibial to 3 mm Mechanical thrombectomy with the penumbra CAT 6 device left superficial femoral, popliteal and anterior tibial arteries.    The patient notes severe worsening of the right leg symptoms and increased rest pain.  Several areas of the right foot look more ischemic.   The patient's husband provides much of the history.     There have been no significant changes to the patient's overall health care.   The patient denies amaurosis fugax or recent TIA symptoms. There are no recent neurological changes noted. The patient denies history of DVT, PE or superficial thrombophlebitis. The patient denies recent episodes of angina or shortness of breath.    ABI's Rt=0.00 and Lt=1.09 (previous ABI's Rt=0.66 and Lt=0.96). Significant deterioration of the right leg   No outpatient medications have been marked as taking for the 03/29/21 encounter (Appointment) with Delana Meyer, Dolores Lory, MD.    Past Medical History:  Diagnosis Date   Acute heart failure [facility])    Aortic stenosis    CHF (congestive heart failure) ([place])    Complication of anesthesia    Hard to wake patient up after having anesthesia   Diabetes mellitus without complication ([place])    Diabetic neuropathy ([place])    Takes Lyrica   Edema of both legs    Takes Lasix   GERD (gastroesophageal reflux disease)    High cholesterol    HTN (hypertension)    Hypertension    PAD (peripheral artery disease) ([facility])     Shortness of breath dyspnea    Spasm of back muscles    Stroke ([place])    Wound, open    Right great toe    Past Surgical History:  Procedure Laterality Date   AMPUTATION TOE Left 06/09/2019   Procedure: Left third toe and partial great toe amputation and debridement;  Surgeon: Katha Cabal, MD;  Location: [facility];  Service: Vascular;  Laterality: Left;   AMPUTATION TOE Left 04/06/2020   Procedure: AMPUTATION LEFT SECOND TOE;  Surgeon: Samara Deist, DPM;  Location: [facility];  Service: Podiatry;  Laterality: Left;   BYPASS GRAFT POPLITEAL TO TIBIAL Right 02/28/2016   Procedure: BYPASS GRAFT RIGHT BELOW KNEE POPLITEAL TO PERONEAL USING REVERSED RIGHT GREATER SAPHENOUS VEIN;  Surgeon: Conrad Bainbridge, MD;  Location: [place];  Service: Vascular;  Laterality: Right;   CYST EXCISION     Abdomen   EYE SURGERY Bilateral    Cataract removal   INTRAMEDULLARY (IM) NAIL INTERTROCHANTERIC Left 02/04/2014   Procedure: INTRAMEDULLARY (IM) NAIL INTERTROCHANTRIC FEMORAL;  Surgeon: Mauri Pole, MD;  Location: [place];  Service: Orthopedics;  Laterality: Left;   IR GENERIC HISTORICAL  03/01/2016   IR ANGIO INTRA EXTRACRAN SEL COM CAROTID INNOMINATE UNI R MOD SED 03/01/2016 [name], MD [facility]   IR GENERIC HISTORICAL  03/01/2016   IR ENDOVASC INTRACRANIAL INF OTHER THAN THROMBO ART INC DIAG ANGIO 03/01/2016 [name], MD [facility]   IR GENERIC HISTORICAL  03/01/2016   IR INTRAVSC  STENT CERV CAROTID W/O EMB-PROT MOD SED INC ANGIO 03/01/2016 [name], MD [facility]   IR GENERIC HISTORICAL  06/03/2016   IR RADIOLOGIST EVAL & MGMT 06/03/2016 [facility]   LOWER EXTREMITY ANGIOGRAPHY Left 02/09/2019   Procedure: LOWER EXTREMITY ANGIOGRAPHY;  Surgeon: Katha Cabal, MD;  Location: [place] [facility];  Service: Cardiovascular;  Laterality: Left;   LOWER EXTREMITY ANGIOGRAPHY Left 03/10/2019   Procedure: LOWER EXTREMITY ANGIOGRAPHY;  Surgeon: Katha Cabal, MD;  Location:  [place] [facility];  Service: Cardiovascular;  Laterality: Left;   LOWER EXTREMITY ANGIOGRAPHY Left 04/27/2019   Procedure: LOWER EXTREMITY ANGIOGRAPHY;  Surgeon: Katha Cabal, MD;  Location: [place] [facility];  Service: Cardiovascular;  Laterality: Left;   LOWER EXTREMITY ANGIOGRAPHY Left 06/08/2019   Procedure: Lower Extremity Angiography;  Surgeon: Katha Cabal, MD;  Location: [place] [facility];  Service: Cardiovascular;  Laterality: Left;   LOWER EXTREMITY ANGIOGRAPHY Left 04/03/2020   Procedure: Lower Extremity Angiography;  Surgeon: Algernon Huxley, MD;  Location: [place] [facility];  Service: Cardiovascular;  Laterality: Left;   LOWER EXTREMITY ANGIOGRAPHY Right 10/20/2020   Procedure: Lower Extremity Angiography;  Surgeon: Katha Cabal, MD;  Location: [place] [facility];  Service: Cardiovascular;  Laterality: Right;   LOWER EXTREMITY ANGIOGRAPHY Left 01/23/2021   Procedure: LOWER EXTREMITY ANGIOGRAPHY;  Surgeon: Katha Cabal, MD;  Location: [place] [facility];  Service: Cardiovascular;  Laterality: Left;   ORIF TOE FRACTURE Right 02/08/2014   Procedure: OPEN REDUCTION INTERNAL FIXATION Right METATARSAL  FRACTURE ;  Surgeon: Wylene Simmer, MD;  Location: [place];  Service: Orthopedics;  Laterality: Right;   PERIPHERAL VASCULAR CATHETERIZATION N/A 12/28/2015   Procedure: Abdominal Aortogram w/Lower Extremity;  Surgeon: Conrad Buffalo, MD;  Location: [place] [facility];  Service: Cardiovascular;  Laterality: N/A;   RADIOLOGY WITH ANESTHESIA N/A 03/01/2016   Procedure: RADIOLOGY WITH ANESTHESIA;  Surgeon: [name], MD;  Location: [place];  Service: Radiology;  Laterality: N/A;   VEIN HARVEST Right 02/28/2016   Procedure: RIGHT GREATER SAPHENOUS VEIN HARVEST;  Surgeon: Conrad Oakley, MD;  [facility];  Service: Vascular;  Laterality: Right;    Social History Social History   Tobacco Use   Smoking status: Never   Smokeless tobacco: Never   Tobacco comments:     Never smoked  Substance Use Topics   Alcohol use: No    Alcohol/week: 0.0 standard drinks   Drug use: No    Family History Family History  Problem Relation Age of Onset   Diabetes Other    Liver disease Mother    CVA Father    Diabetes Father    Hypertension Father     Allergies  Allergen Reactions   Iodine Anaphylaxis   Penicillins Anaphylaxis    Tolerates ceftriaxone, cefazolin Did it involve swelling of the face/tongue/throat, SOB, or low BP? Unknown Did it involve sudden or severe rash/hives, skin peeling, or any reaction on the inside of your mouth or nose? Unknown Did you need to seek medical attention at a hospital or doctor's office? Unknown When did it last happen?      Unknown If all above answers are "NO", may proceed with cephalosporin use.    Shellfish Allergy Anaphylaxis   Sulfa Antibiotics Anaphylaxis   Sulfacetamide Sodium Anaphylaxis   Sulfasalazine Anaphylaxis   Eggs Or Egg-Derived Products     Other reaction(s): SHORTNESS OF BREATH   Morphine And Related Other (See Comments)  Altered mental status     REVIEW OF SYSTEMS (Negative unless checked)  Constitutional: '[]'$ Weight loss  '[]'$ Fever  '[]'$ Chills Cardiac: '[]'$ Chest pain   '[]'$ Chest pressure   '[]'$ Palpitations   '[]'$ Shortness of breath when laying flat   '[]'$ Shortness of breath with exertion. Vascular:  '[x]'$ Pain in legs with walking   '[x]'$ Pain in legs at rest  '[]'$ History of DVT   '[]'$ Phlebitis   '[]'$ Swelling in legs   '[]'$ Varicose veins   '[]'$ Non-healing ulcers Pulmonary:   '[]'$ Uses home oxygen   '[]'$ Productive cough   '[]'$ Hemoptysis   '[]'$ Wheeze  '[]'$ COPD   '[]'$ Asthma Neurologic:  '[]'$ Dizziness   '[]'$ Seizures   '[]'$ History of stroke   '[]'$ History of TIA  '[]'$ Aphasia   '[]'$ Vissual changes   '[]'$ Weakness or numbness in arm   '[]'$ Weakness or numbness in leg Musculoskeletal:   '[]'$ Joint swelling   '[]'$ Joint pain   '[]'$ Low back pain Hematologic:  '[]'$ Easy bruising  '[]'$ Easy bleeding   '[]'$ Hypercoagulable state   '[]'$ Anemic Gastrointestinal:  '[]'$ Diarrhea    '[]'$ Vomiting  '[]'$ Gastroesophageal reflux/heartburn   '[]'$ Difficulty swallowing. Genitourinary:  '[]'$ Chronic kidney disease   '[]'$ Difficult urination  '[]'$ Frequent urination   '[]'$ Blood in urine Skin:  '[]'$ Rashes   '[]'$ Ulcers  Psychological:  '[]'$ History of anxiety   '[]'$  History of major depression.  Physical Examination  There were no vitals filed for this visit. There is no height or weight on file to calculate BMI. Gen: WD/WN, Moderate distress  seen in a wheelchair Head: [place]/AT, No temporalis wasting.  Ear/Nose/Throat: Hearing grossly intact, nares w/o erythema or drainage Eyes: PER, EOMI, sclera nonicteric.  Neck: Supple, no masses.  No bruit or JVD.  Pulmonary:  Good air movement, no audible wheezing, no use of accessory muscles.  Cardiac: RRR, normal S1, S2, no Murmurs. Vascular:  ulcers of the right foot noted Vessel Right Left  Radial Palpable Palpable  PT Not Palpable Not Palpable  DP Not Palpable Not Palpable  Gastrointestinal: soft, non-distended. No guarding/no peritoneal signs.  Musculoskeletal: M/S 5/5 throughout.  No visible deformity.  Neurologic: CN 2-12 intact. Pain and light touch intact in extremities.  Symmetrical.  Speech is fluent. Motor exam as listed above. Psychiatric: Judgment intact, Mood & affect appropriate for pt's clinical situation. Dermatologic: No rashes + ulcers noted right.  No changes consistent with cellulitis.   CBC Lab Results  Component Value Date   WBC 4.3 03/10/2021   HGB 9.8 (L) 03/10/2021   HCT 31.4 (L) 03/10/2021   MCV 81.6 03/10/2021   PLT 148 (L) 03/10/2021    BMET    Component Value Date/Time   NA 141 03/10/2021 0629   K 3.4 (L) 03/10/2021 0629   CL 107 03/10/2021 0629   CO2 27 03/10/2021 0629   GLUCOSE 151 (H) 03/10/2021 0629   BUN 15 03/10/2021 0629   CREATININE 0.45 03/10/2021 0629   CALCIUM 8.7 (L) 03/10/2021 0629   GFRNONAA >60 03/10/2021 0629   GFRAA >60 04/06/2020 0535   CrCl cannot be calculated (Unknown ideal  weight.).  COAG Lab Results  Component Value Date   INR 1.1 03/06/2021   INR 1.0 10/19/2020   INR 1.1 03/31/2020    Radiology CT ABDOMEN PELVIS WO CONTRAST  Result Date: 03/06/2021 CLINICAL DATA:  Sepsis, febrile, unresponsive EXAM: CT ABDOMEN AND PELVIS WITHOUT CONTRAST TECHNIQUE: Multidetector CT imaging of the abdomen and pelvis was performed following the standard protocol without IV contrast. COMPARISON:  03/03/2016, 03/06/2021 FINDINGS: Lower chest: There are small bilateral pleural effusions, left greater than right. There is a left pericardial effusion  measuring up to 2.9 cm in thickness along the posterior wall of the left ventricle. This is increased since prior study. Dense calcification of the aortic and mitral valves. Hepatobiliary: Calcified gallstone is identified without cholecystitis. Unenhanced imaging of the liver demonstrates no focal abnormalities. Pancreas: Unremarkable. No pancreatic ductal dilatation or surrounding inflammatory changes. Spleen: Normal in size without focal abnormality. Adrenals/Urinary Tract: There are bilateral nonobstructing renal calculi, measuring 4 mm on the left and 2 mm on the right. Bladder is unremarkable. Stable nodularity of the left adrenal gland consistent with adenoma. The right adrenals unremarkable. Stomach/Bowel: No bowel obstruction or ileus. Moderate retained stool within the distal colon. There is distal colonic diverticulosis without diverticulitis. Normal appendix right lower quadrant. No bowel wall thickening or inflammatory change. Vascular/Lymphatic: Aortic atherosclerosis. No enlarged abdominal or pelvic lymph nodes. Reproductive: Uterus and bilateral adnexa are unremarkable. Calcified uterine fibroid again noted. Other: No free fluid or free intraperitoneal gas. Fat containing left inguinal hernia. No bowel herniation. Musculoskeletal: No acute or destructive bony lesions. Postsurgical changes proximal left femur. Reconstructed images  demonstrate no additional findings. IMPRESSION: 1. Small bilateral pleural effusions, left greater than right. 2. Moderate pericardial effusion along the posterior aspect of the left ventricle. This may be loculated. 3. Cholelithiasis without cholecystitis. 4. Bilateral nonobstructing renal calculi. 5. Distal colonic diverticulosis without diverticulitis. 6.  Aortic Atherosclerosis (ICD10-I70.0). Electronically Signed   By: Randa Ngo M.D.   On: 03/06/2021 23:47   CT HEAD WO CONTRAST (5MM)  Result Date: 03/06/2021 CLINICAL DATA:  Delirium EXAM: CT HEAD WITHOUT CONTRAST TECHNIQUE: Contiguous axial images were obtained from the base of the skull through the vertex without intravenous contrast. COMPARISON:  Head CT 10/19/2020 FINDINGS: Brain: There is no mass, hemorrhage or extra-axial collection. There is generalized atrophy without lobar predilection. Hypodensity of the white matter is most commonly associated with chronic microvascular disease. Unchanged calcified mass in the right posterior fossa likely a meningioma. Unchanged focus of encephalomalacia in the right parietal lobe. Vascular: Atherosclerotic calcification of the internal carotid arteries at the skull base. No abnormal hyperdensity of the major intracranial arteries or dural venous sinuses. Skull: The visualized skull base, calvarium and extracranial soft tissues are normal. Sinuses/Orbits: No fluid levels or advanced mucosal thickening of the visualized paranasal sinuses. No mastoid or middle ear effusion. The orbits are normal. IMPRESSION: 1. No acute intracranial abnormality. 2. Unchanged right posterior fossa meningioma. 3. Chronic microvascular ischemia and generalized atrophy. Electronically Signed   By: Ulyses Jarred M.D.   On: 03/06/2021 23:56   DG Chest Portable 1 View  Result Date: 03/06/2021 CLINICAL DATA:  Fever. EXAM: PORTABLE CHEST 1 VIEW COMPARISON:  March 30, 2020 FINDINGS: Moderate severity left basilar atelectasis and/or  infiltrate is seen. There is a small left pleural effusion. No pneumothorax is identified. The cardiac silhouette is normal in size. Right-sided tracheal deviation is seen. A radiopaque vascular stent is noted within the neck soft tissues on the left. Degenerative changes are seen throughout the thoracic spine. IMPRESSION: 1. Moderate severity left basilar atelectasis and/or infiltrate. 2. Small left pleural effusion. Electronically Signed   By: Virgina Norfolk M.D.   On: 03/06/2021 22:50   ECHOCARDIOGRAM COMPLETE  Result Date: 03/08/2021    ECHOCARDIOGRAM REPORT   Patient Name:   LESLEYANNE RIBBECK Teixeira Date of Exam: 03/08/2021 Medical Rec #:  [id number]     Height:       68.0 in Accession #:    [id number]    Weight:  144.6 lb Date of Birth:  09-03-1940      BSA:          1.781 m Patient Age:    40 years      BP:           137/52 mmHg Patient Gender: F             HR:           77 bpm. Exam Location:  [facility] Procedure: 2D Echo, Cardiac Doppler and Color Doppler Indications:    Pericardial Effusion  History:        Patient has prior history of Echocardiogram examinations, most                 recent 12/24/2018. CHF, CAD, Stroke, Aortic Valve Disease,                 Arrythmias:LBBB; Risk Factors:Hypertension and Diabetes. COVID                 +.  Sonographer:    Wenda Low Referring Phys: [place]  1. Left ventricular ejection fraction, by estimation, is 70 to 75%. The left ventricle has hyperdynamic function. The left ventricle has no regional wall motion abnormalities. There is severe asymmetric left ventricular hypertrophy of the septal segment. No significant LVOT gradient. Left ventricular diastolic parameters are consistent with Grade I diastolic dysfunction (impaired relaxation).  2. RV-RA gradient normal at 16 mmHg. Right ventricular systolic function is normal. The right ventricular size is normal.  3. Left atrial size was mildly dilated.  4. The mitral valve is abnormal, mildly  thickened and calcified. Trivial mitral valve regurgitation. The mean mitral valve gradient is 3.0 mmHg. Moderate mitral annular calcification.  5. The aortic valve is tricuspid. There is moderate calcification of the aortic valve. Aortic valve regurgitation is not visualized. Moderate aortic valve stenosis. Aortic valve area, by VTI measures 1.43 cm. Aortic valve mean gradient measures 9.5 mmHg. Dimentionless index 0.45.  6. Unable to estimate CVP. Comparison(s): Prior images reviewed side by side. Aortic valve gradients measure less, but still overall consistent with moderate aortic stenosis. FINDINGS  Left Ventricle: Left ventricular ejection fraction, by estimation, is 70 to 75%. The left ventricle has hyperdynamic function. The left ventricle has no regional wall motion abnormalities. The left ventricular internal cavity size was small. There is severe asymmetric left ventricular hypertrophy of the septal segment. Left ventricular diastolic parameters are consistent with Grade I diastolic dysfunction (impaired relaxation). Right Ventricle: RV-RA gradient normal at 16 mmHg. The right ventricular size is normal. No increase in right ventricular wall thickness. Right ventricular systolic function is normal. Left Atrium: Left atrial size was mildly dilated. Right Atrium: Right atrial size was normal in size. Pericardium: There is no evidence of pericardial effusion. Presence of pericardial fat pad. Mitral Valve: The mitral valve is abnormal. There is mild thickening of the mitral valve leaflet(s). There is mild calcification of the mitral valve leaflet(s). Moderate mitral annular calcification. Trivial mitral valve regurgitation. MV peak gradient, 7.5 mmHg. The mean mitral valve gradient is 3.0 mmHg. Tricuspid Valve: The tricuspid valve is grossly normal. Tricuspid valve regurgitation is trivial. Aortic Valve: The aortic valve is tricuspid. There is moderate calcification of the aortic valve. There is mild aortic  valve annular calcification. Aortic valve regurgitation is not visualized. Moderate aortic stenosis is present. Aortic valve mean gradient measures 9.5 mmHg. Aortic valve peak gradient measures 20.0 mmHg. Aortic valve area, by VTI measures  1.43 cm. Pulmonic Valve: The pulmonic valve was grossly normal. Pulmonic valve regurgitation is trivial. Aorta: The aortic root is normal in size and structure. Venous: Unable to estimate CVP. The inferior vena cava was not well visualized. IAS/Shunts: No atrial level shunt detected by color flow Doppler.  LEFT VENTRICLE PLAX 2D LVIDd:         2.99 cm  Diastology LVIDs:         1.73 cm  LV e' medial:    3.30 cm/s LV PW:         1.50 cm  LV E/e' medial:  19.9 LV IVS:        1.93 cm  LV e' lateral:   3.44 cm/s LVOT diam:     2.00 cm  LV E/e' lateral: 19.1 LV SV:         63 LV SV Index:   35 LVOT Area:     3.14 cm  RIGHT VENTRICLE RV Basal diam:  2.65 cm RV Mid diam:    1.94 cm RV S prime:     13.80 cm/s TAPSE (M-mode): 2.5 cm LEFT ATRIUM             Index       RIGHT ATRIUM           Index LA diam:        3.90 cm 2.19 cm/m  RA Area:     11.20 cm LA Vol (A2C):   79.3 ml 44.53 ml/m RA Volume:   17.90 ml  10.05 ml/m LA Vol (A4C):   52.8 ml 29.65 ml/m LA Biplane Vol: 66.7 ml 37.46 ml/m  AORTIC VALVE AV Area (Vmax):    1.25 cm AV Area (Vmean):   1.29 cm AV Area (VTI):     1.43 cm AV Vmax:           223.50 cm/s AV Vmean:          133.500 cm/s AV VTI:            0.440 m AV Peak Grad:      20.0 mmHg AV Mean Grad:      9.5 mmHg LVOT Vmax:         88.70 cm/s LVOT Vmean:        54.700 cm/s LVOT VTI:          0.200 m LVOT/AV VTI ratio: 0.45  AORTA Ao Root diam: 2.80 cm Ao Asc diam:  2.80 cm MITRAL VALVE                TRICUSPID VALVE MV Area (PHT): 2.28 cm     TR Peak grad:   16.5 mmHg MV Area VTI:   2.07 cm     TR Vmax:        203.00 cm/s MV Peak grad:  7.5 mmHg MV Mean grad:  3.0 mmHg     SHUNTS MV Vmax:       1.37 m/s     Systemic VTI:  0.20 m MV Vmean:      74.5 cm/s     Systemic Diam: 2.00 cm MV Decel Time: 333 msec MV E velocity: 65.60 cm/s MV A velocity: 111.00 cm/s MV E/A ratio:  0.59 Rozann Lesches MD Electronically signed by Rozann Lesches MD Signature Date/Time: 03/08/2021/3:20:14 PM    Final      Assessment/Plan 1. 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  This represents a limb threatening ischemia and places the patient at the risk for right limb loss.  ABI's Rt=0.00 and Lt=1.09 (previous ABI's Rt=0.66 and Lt=0.96). Significant deterioration of the right leg  Patient should undergo angiography of the right lower extremity with the hope for intervention for limb salvage.  The risks and benefits as well as the alternative therapies was discussed in detail with the patient.  All questions were answered.  Patient agrees to proceed with right leg angiography.  The patient will follow up with me in the office after the procedure.   2. Primary hypertension Continue antihypertensive medications as already ordered, these medications have been reviewed and there are no changes at this time.   3. Chronic obstructive pulmonary disease, unspecified COPD type ([place]) Continue pulmonary medications and aerosols as already ordered, these medications have been reviewed and there are no changes at this time.      Hortencia Pilar, MD  03/28/2021 6:37 PM

## 2021-03-29 ENCOUNTER — Other Ambulatory Visit: Payer: Self-pay

## 2021-03-29 ENCOUNTER — Encounter (INDEPENDENT_AMBULATORY_CARE_PROVIDER_SITE_OTHER): Payer: Self-pay | Admitting: Vascular Surgery

## 2021-03-29 ENCOUNTER — Ambulatory Visit (INDEPENDENT_AMBULATORY_CARE_PROVIDER_SITE_OTHER): Payer: Medicare HMO | Admitting: Vascular Surgery

## 2021-03-29 ENCOUNTER — Ambulatory Visit (INDEPENDENT_AMBULATORY_CARE_PROVIDER_SITE_OTHER): Payer: Medicare HMO

## 2021-03-29 VITALS — BP 139/82 | HR 78 | Ht 65.0 in | Wt 135.0 lb

## 2021-03-29 DIAGNOSIS — I10 Essential (primary) hypertension: Secondary | ICD-10-CM

## 2021-03-29 DIAGNOSIS — I739 Peripheral vascular disease, unspecified: Secondary | ICD-10-CM | POA: Diagnosis not present

## 2021-03-29 DIAGNOSIS — J449 Chronic obstructive pulmonary disease, unspecified: Secondary | ICD-10-CM | POA: Diagnosis not present

## 2021-03-29 DIAGNOSIS — I70263 Atherosclerosis of native arteries of extremities with gangrene, bilateral legs: Secondary | ICD-10-CM | POA: Diagnosis not present

## 2021-04-01 ENCOUNTER — Encounter (INDEPENDENT_AMBULATORY_CARE_PROVIDER_SITE_OTHER): Payer: Self-pay | Admitting: Vascular Surgery

## 2021-04-02 ENCOUNTER — Encounter (INDEPENDENT_AMBULATORY_CARE_PROVIDER_SITE_OTHER): Payer: Self-pay

## 2021-04-02 ENCOUNTER — Ambulatory Visit: Payer: Medicare HMO

## 2021-04-02 ENCOUNTER — Telehealth (INDEPENDENT_AMBULATORY_CARE_PROVIDER_SITE_OTHER): Payer: Self-pay

## 2021-04-02 NOTE — Telephone Encounter (Signed)
Patient is schedule right lower extremity with intervention on 04/10/21 arrival time 9 am at [facility] with [employer]. Patient husband was made aware with procedure date & time. Pre-procedure instructions will be mailed out.

## 2021-04-04 ENCOUNTER — Ambulatory Visit: Payer: Medicare HMO

## 2021-04-10 ENCOUNTER — Ambulatory Visit
Admission: RE | Admit: 2021-04-10 | Discharge: 2021-04-10 | Disposition: A | Discharge status: Home / Self Care | Payer: Medicare HMO | Attending: Vascular Surgery | Admitting: Vascular Surgery

## 2021-04-10 ENCOUNTER — Other Ambulatory Visit: Payer: Self-pay

## 2021-04-10 ENCOUNTER — Other Ambulatory Visit (INDEPENDENT_AMBULATORY_CARE_PROVIDER_SITE_OTHER): Payer: Self-pay | Admitting: Nurse Practitioner

## 2021-04-10 ENCOUNTER — Encounter: Payer: Self-pay | Admitting: Certified Registered Nurse Anesthetist

## 2021-04-10 ENCOUNTER — Encounter
Admission: RE | Disposition: A | Discharge status: Home / Self Care | Payer: Self-pay | Source: Home / Self Care | Attending: Vascular Surgery

## 2021-04-10 ENCOUNTER — Encounter: Payer: Self-pay | Admitting: Vascular Surgery

## 2021-04-10 DIAGNOSIS — Z91013 Allergy to seafood: Secondary | ICD-10-CM | POA: Insufficient documentation

## 2021-04-10 DIAGNOSIS — Z91048 Other nonmedicinal substance allergy status: Secondary | ICD-10-CM | POA: Diagnosis not present

## 2021-04-10 DIAGNOSIS — I70269 Atherosclerosis of native arteries of extremities with gangrene, unspecified extremity: Secondary | ICD-10-CM

## 2021-04-10 DIAGNOSIS — Z885 Allergy status to narcotic agent status: Secondary | ICD-10-CM | POA: Diagnosis not present

## 2021-04-10 DIAGNOSIS — J449 Chronic obstructive pulmonary disease, unspecified: Secondary | ICD-10-CM | POA: Insufficient documentation

## 2021-04-10 DIAGNOSIS — Z91012 Allergy to eggs: Secondary | ICD-10-CM | POA: Insufficient documentation

## 2021-04-10 DIAGNOSIS — I70261 Atherosclerosis of native arteries of extremities with gangrene, right leg: Secondary | ICD-10-CM | POA: Insufficient documentation

## 2021-04-10 DIAGNOSIS — Z833 Family history of diabetes mellitus: Secondary | ICD-10-CM | POA: Diagnosis not present

## 2021-04-10 DIAGNOSIS — I70223 Atherosclerosis of native arteries of extremities with rest pain, bilateral legs: Secondary | ICD-10-CM | POA: Insufficient documentation

## 2021-04-10 DIAGNOSIS — E1152 Type 2 diabetes mellitus with diabetic peripheral angiopathy with gangrene: Secondary | ICD-10-CM | POA: Diagnosis present

## 2021-04-10 DIAGNOSIS — I70221 Atherosclerosis of native arteries of extremities with rest pain, right leg: Secondary | ICD-10-CM | POA: Diagnosis not present

## 2021-04-10 DIAGNOSIS — I10 Essential (primary) hypertension: Secondary | ICD-10-CM | POA: Insufficient documentation

## 2021-04-10 DIAGNOSIS — L97519 Non-pressure chronic ulcer of other part of right foot with unspecified severity: Secondary | ICD-10-CM | POA: Insufficient documentation

## 2021-04-10 DIAGNOSIS — Z880 Allergy status to penicillin: Secondary | ICD-10-CM | POA: Insufficient documentation

## 2021-04-10 DIAGNOSIS — Z882 Allergy status to sulfonamides status: Secondary | ICD-10-CM | POA: Diagnosis not present

## 2021-04-10 HISTORY — PX: LOWER EXTREMITY ANGIOGRAPHY: CATH118251

## 2021-04-10 LAB — CREATININE, SERUM
Creatinine, Ser: 0.43 mg/dL — ABNORMAL LOW (ref 0.44–1.00)
GFR, Estimated: >60 mL/min (ref >60)

## 2021-04-10 LAB — GLUCOSE, CAPILLARY
Glucose-Capillary: 165 mg/dL — ABNORMAL HIGH (ref 70–99)
Glucose-Capillary: 202 mg/dL — ABNORMAL HIGH (ref 70–99)

## 2021-04-10 LAB — BUN: BUN: 12 mg/dL (ref 8–23)

## 2021-04-10 SURGERY — LOWER EXTREMITY ANGIOGRAPHY
Anesthesia: Moderate Sedation | Site: Leg Lower | Laterality: Right

## 2021-04-10 MED ORDER — DIPHENHYDRAMINE HCL 50 MG/ML IJ SOLN
INTRAMUSCULAR | Status: AC
  Administered 2021-04-10: 50 mg via INTRAVENOUS
  Filled 2021-04-10: qty 1

## 2021-04-10 MED ORDER — FAMOTIDINE 20 MG PO TABS
ORAL_TABLET | ORAL | Status: AC
  Administered 2021-04-10: 40 mg via ORAL
  Filled 2021-04-10: qty 2

## 2021-04-10 MED ORDER — SODIUM CHLORIDE 0.9 % IV SOLN: INTRAVENOUS | Status: DC

## 2021-04-10 MED ORDER — FENTANYL CITRATE PF 50 MCG/ML IJ SOSY
PREFILLED_SYRINGE | INTRAMUSCULAR | Status: AC
  Filled 2021-04-10: qty 1

## 2021-04-10 MED ORDER — HEPARIN SODIUM (PORCINE) 1000 UNIT/ML IJ SOLN
INTRAMUSCULAR | Status: AC
  Filled 2021-04-10: qty 1

## 2021-04-10 MED ORDER — ACETAMINOPHEN 325 MG PO TABS: 650.0000 mg | ORAL_TABLET | ORAL | Status: DC | PRN

## 2021-04-10 MED ORDER — IODIXANOL 320 MG/ML IV SOLN
INTRAVENOUS | Status: DC | PRN
  Administered 2021-04-10: 105 mL via INTRA_ARTERIAL

## 2021-04-10 MED ORDER — MIDAZOLAM HCL 2 MG/2ML IJ SOLN
INTRAMUSCULAR | Status: DC | PRN
  Administered 2021-04-10 (×3): 1 mg via INTRAVENOUS

## 2021-04-10 MED ORDER — SODIUM CHLORIDE 0.9% FLUSH: 3.0000 mL | Freq: Two times a day (BID) | INTRAVENOUS | Status: DC

## 2021-04-10 MED ORDER — OXYCODONE HCL 5 MG PO TABS: 5.0000 mg | ORAL_TABLET | ORAL | Status: DC | PRN

## 2021-04-10 MED ORDER — ONDANSETRON HCL 4 MG/2ML IJ SOLN: 4.0000 mg | Freq: Four times a day (QID) | INTRAMUSCULAR | Status: DC | PRN

## 2021-04-10 MED ORDER — FENTANYL CITRATE (PF) 100 MCG/2ML IJ SOLN
INTRAMUSCULAR | Status: DC | PRN
  Administered 2021-04-10: 25 ug via INTRAVENOUS
  Administered 2021-04-10: 50 ug via INTRAVENOUS
  Administered 2021-04-10: 25 ug via INTRAVENOUS

## 2021-04-10 MED ORDER — SODIUM CHLORIDE 0.9 % IV SOLN: 250.0000 mL | INTRAVENOUS | Status: DC | PRN

## 2021-04-10 MED ORDER — FAMOTIDINE 20 MG PO TABS: 40.0000 mg | ORAL_TABLET | Freq: Once | ORAL | Status: AC | PRN

## 2021-04-10 MED ORDER — DIPHENHYDRAMINE HCL 50 MG/ML IJ SOLN: 50.0000 mg | Freq: Once | INTRAMUSCULAR | Status: AC | PRN

## 2021-04-10 MED ORDER — METHYLPREDNISOLONE SODIUM SUCC 125 MG IJ SOLR
INTRAMUSCULAR | Status: AC
  Administered 2021-04-10: 125 mg via INTRAVENOUS
  Filled 2021-04-10: qty 2

## 2021-04-10 MED ORDER — CLINDAMYCIN PHOSPHATE 300 MG/50ML IV SOLN: 300.0000 mg | Freq: Once | INTRAVENOUS | Status: AC

## 2021-04-10 MED ORDER — LABETALOL HCL 5 MG/ML IV SOLN: 10.0000 mg | INTRAVENOUS | Status: DC | PRN

## 2021-04-10 MED ORDER — MIDAZOLAM HCL 2 MG/ML PO SYRP: 8.0000 mg | ORAL_SOLUTION | Freq: Once | ORAL | Status: DC | PRN

## 2021-04-10 MED ORDER — HYDRALAZINE HCL 20 MG/ML IJ SOLN
5.0000 mg | INTRAMUSCULAR | Status: DC | PRN
Start: 2021-04-10 — End: 2021-04-10

## 2021-04-10 MED ORDER — SODIUM CHLORIDE 0.9% FLUSH: 3.0000 mL | INTRAVENOUS | Status: DC | PRN

## 2021-04-10 MED ORDER — MORPHINE SULFATE (PF) 4 MG/ML IV SOLN
2.0000 mg | INTRAVENOUS | Status: DC | PRN
Start: 2021-04-10 — End: 2021-04-10

## 2021-04-10 MED ORDER — FENTANYL CITRATE PF 50 MCG/ML IJ SOSY: 12.5000 ug | PREFILLED_SYRINGE | Freq: Once | INTRAMUSCULAR | Status: DC | PRN

## 2021-04-10 MED ORDER — HEPARIN SODIUM (PORCINE) 1000 UNIT/ML IJ SOLN
INTRAMUSCULAR | Status: DC | PRN
  Administered 2021-04-10: 5000 [IU] via INTRAVENOUS
  Administered 2021-04-10: 1000 [IU] via INTRAVENOUS

## 2021-04-10 MED ORDER — CLINDAMYCIN PHOSPHATE 300 MG/50ML IV SOLN
INTRAVENOUS | Status: AC
  Administered 2021-04-10: 300 mg via INTRAVENOUS
  Filled 2021-04-10: qty 50

## 2021-04-10 MED ORDER — METHYLPREDNISOLONE SODIUM SUCC 125 MG IJ SOLR: 125.0000 mg | Freq: Once | INTRAMUSCULAR | Status: AC | PRN

## 2021-04-10 MED ORDER — MIDAZOLAM HCL 5 MG/5ML IJ SOLN
INTRAMUSCULAR | Status: AC
  Filled 2021-04-10: qty 5

## 2021-04-10 SURGICAL SUPPLY — 43 items
BALLN DORADO 4X100X135 (BALLOONS) ×2
BALLN LUTONIX 018 4X220X130 (BALLOONS) ×2
BALLN LUTONIX 018 5X100X130 (BALLOONS) ×2
BALLN LUTONIX 018 5X60X130 (BALLOONS) ×2
BALLN ULTRVRSE 3X100X130C (BALLOONS) ×2
BALLN ULTRVRSE 3X220X150 (BALLOONS) ×2
BALLN ULTRVRSE 4X40X130C (BALLOONS) ×2
BALLN ULTRVRSE 4X40X150 (BALLOONS) ×1
BALLN ULTRVRSE 4X40X150 OTW (BALLOONS) ×1
BALLOON DORADO 4X100X135 (BALLOONS) ×1 IMPLANT
BALLOON LUTONIX 018 4X220X130 (BALLOONS) ×1 IMPLANT
BALLOON LUTONIX 018 5X100X130 (BALLOONS) ×1 IMPLANT
BALLOON LUTONIX 018 5X60X130 (BALLOONS) ×1 IMPLANT
BALLOON ULTRVRSE 3X100X130C (BALLOONS) ×1 IMPLANT
BALLOON ULTRVRSE 3X220X150 (BALLOONS) ×1 IMPLANT
BALLOON ULTRVRSE 4X40X130C (BALLOONS) ×1 IMPLANT
BALLOON ULTRVRSE 4X40X150 OTW (BALLOONS) ×1 IMPLANT
CATH ANGIO 5F PIGTAIL 65CM (CATHETERS) ×2 IMPLANT
CATH NAVICROSS ANGLED 135CM (MICROCATHETER) ×2 IMPLANT
CATH ROTAREX 135 6FR (CATHETERS) ×2 IMPLANT
CATH VERT 5FR 125CM (CATHETERS) ×2 IMPLANT
DEVICE STARCLOSE SE CLOSURE (Vascular Products) ×2 IMPLANT
DEVICE TORQUE .025-.038 (MISCELLANEOUS) ×2 IMPLANT
GLIDEWIRE ADV .014X300CM (WIRE) ×2 IMPLANT
GLIDEWIRE ADV .035X260CM (WIRE) ×2 IMPLANT
GLIDEWIRE ANGLED SS 035X260CM (WIRE) ×2 IMPLANT
KIT ENCORE 26 ADVANTAGE (KITS) ×2 IMPLANT
KIT ENCORE 40 (KITS) ×2 IMPLANT
KIT MICROPUNCTURE NIT STIFF (SHEATH) ×2 IMPLANT
NEEDLE ENTRY 21GA 7CM ECHOTIP (NEEDLE) ×2 IMPLANT
PACK ANGIOGRAPHY (CUSTOM PROCEDURE TRAY) ×2 IMPLANT
SHEATH BRITE TIP 5FRX11 (SHEATH) ×2 IMPLANT
SHEATH RAABE 6FR (SHEATH) ×2 IMPLANT
STENT LIFESTENT 5F 6X100X135 (Permanent Stent) ×2 IMPLANT
STENT ONYX FRONTIER 4.0X30 (Permanent Stent) ×2 IMPLANT
STENT VIABAHN 5X50X120 (Permanent Stent) ×2 IMPLANT
SYR MEDRAD MARK 7 150ML (SYRINGE) ×2 IMPLANT
TOWEL OR 17X26 4PK STRL BLUE (TOWEL DISPOSABLE) ×2 IMPLANT
TUBING CONTRAST HIGH PRESS 48 (TUBING) ×4 IMPLANT
WIRE AMPLATZ SSTIFF .035X260CM (WIRE) ×2 IMPLANT
WIRE G V18X300CM (WIRE) ×6 IMPLANT
WIRE GUIDERIGHT .035X150 (WIRE) ×2 IMPLANT
WIRE HI TORQ VERSACORE 300 (WIRE) ×2 IMPLANT

## 2021-04-10 NOTE — Interval H&P Note (Signed)
History and Physical Interval Note:  04/10/2021 11:19 AM  [name]  has presented today for surgery, with the diagnosis of RT Lower Extremity Angiography with Intervention   ASO with gangrene [facility]   BARD Rep   cc: [name]  The various methods of treatment have been discussed with the patient and family. After consideration of risks, benefits and other options for treatment, the patient has consented to  Procedure(s): LOWER EXTREMITY ANGIOGRAPHY (Right) as a surgical intervention.  The patient's history has been reviewed, patient examined, no change in status, stable for surgery.  I have reviewed the patient's chart and labs.  Questions were answered to the patient's satisfaction.     [name]

## 2021-04-10 NOTE — Op Note (Signed)
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  Introduction catheter into right lower extremity 3rd order catheter placement               2.    Contrast injection right lower extremity for distal runoff             3.  Percutaneous transluminal angioplasty and stent placement right superficial femoral and popliteal arteries to 5 mm             4.  Percutaneous transluminal angioplasty and stent placement right anterior tibial             5.  Thrombectomy right SFA and popliteal.             6.  Star close closure left common femoral arteriotomy  Anesthesia: Conscious sedation was administered under my direct supervision by the interventional radiology RN. IV Versed plus fentanyl were utilized. Continuous ECG, pulse oximetry and blood pressure was monitored throughout the entire procedure.  Conscious sedation was for a total of 1 hour 17 minutes.  Sheath: 6 French 90 cm Raby left common femoral retrograde  Contrast: 105 cc  Fluoroscopy Time: 30.8 minutes  Indications:  [name] presents with increasing pain of the right lower extremity.  Noninvasive studies as well as physical examination demonstrate worsening ischemic changes.  This suggests the patient is having limb threatening ischemia. The risks and benefits are reviewed all questions answered patient agrees to proceed.  Procedure: [name] is a 80 y.o. y.o. female who was identified and appropriate procedural time out was performed.  The patient was then placed supine on the table and prepped and draped in the usual sterile fashion.    Ultrasound was placed in the sterile sleeve and the left groin was evaluated the left  common femoral artery was echolucent and pulsatile indicating patency.  Image was recorded for the permanent record and under real-time visualization a microneedle was inserted into the common femoral artery followed by the microwire and then the micro-sheath.  A J-wire was then advanced through the micro-sheath and a  5 [place] sheath was then inserted over a J-wire. J-wire was then advanced and a 5 French pigtail catheter was positioned at the level of T12.  AP projection of the aorta was then obtained. Pigtail catheter was repositioned to above the bifurcation and a LAO view of the pelvis was obtained.  Subsequently a pigtail catheter with the stiff angle Glidewire was used to cross the aortic bifurcation the catheter wire were advanced down into the right distal external iliac artery. Oblique view of the femoral bifurcation was then obtained and subsequently the wire was reintroduced and the pigtail catheter negotiated into the SFA representing third order catheter placement. Distal runoff was then performed.  Diagnostic Interpretation: The abdominal aorta is opacified with bolus injection of contrast.  There are no hemodynamically significant lesions noted.  Single renal arteries are noted with no evidence of hemodynamically significant renal artery stenosis.  The bilateral common internal and external iliac arteries are widely patent without hemodynamically significant stenosis.  The right common femoral is patent there is mild calcific disease but there are no hemodynamically significant stenoses.  The profunda femoris is patent but demonstrates fairly significant diffuse distal disease and collateralizes very poorly down to the distal thigh and around the knee.  The superficial femoral artery is patent in its proximal two thirds without hemodynamically significant stenosis.  At [facility] canal there is a previously placed stent which is occluded the mid and distal popliteal artery is occluded as is the  trifurcation.  There is complete nonvisualization of the tibioperoneal trunk peroneal and posterior tibial arteries throughout their entirety.  The anterior tibial artery is reconstituted several centimeters distal to the origin and is patent without hemodynamically significant stenosis down to the ankle where it occludes however there is a large collateral that extends from the terminus of the distal anterior tibial over to the plantar vessels.  The dorsalis pedis is nonvisualized.  5000 units of heparin was then given and allowed to circulate and a 6 [place] Rabie sheath was advanced up and over the bifurcation and positioned in the mid femoral artery  Vertebral catheter and advantage wire were then negotiated down into the distal popliteal and ultimately I was able to cross the occlusion and advance a Nava cross catheter into the anterior tibial where hand-injection contrast confirmed intraluminal placement.  AV 18 wire was then introduced.  The [place] Rex catheter was then prepped on the field and thrombectomy was performed from the SFA occlusion down to the distal popliteal.  Follow-up imaging now demonstrated there was recanalization of flow although there was significant and diffuse hemodynamically significant disease throughout the mid distal popliteal and origin of the anterior tibial.  The next phase of the intervention was probably the most difficult as the segment within the origin of the anterior tibial was profoundly stenotic and it took multiple different wires catheters and indeed balloons to try to cross this area I was able to get across with a 3 balloon and inflated this to try to create a better passage unfortunately 4 mm balloons would not cross and I was unable to get either life stent or Viabahn stent across this lesion.  Ultimately I was able to get him a 4 mm x 30 mm Onyx frontier coronary stent across this lesion there is inflated to 40 atm not once but twice this opened up the anterior  tibial and even with a 40 atm inflation I still did not fully profile the stent.  However I estimate approximately 20% residual stenosis.  I then proceeded with placing a life stent from the previous stent down to the Onyx stent and this was postdilated with a 5 mm balloon inflated to 12 atm for 1 minute.  Follow-up imaging now demonstrated there was also a greater than 60% stenosis in the proximal previously placed life stent essentially just distal to Hunter's canal.  This area was treated with a 5 mm x 50 mm Viabahn that was postdilated to 5 mm.  Follow-up imaging now demonstrated the SFA popliteal and anterior tibial were patent there is less than 10% residual stenosis throughout the SFA and popliteal there is less than 20% residual stenosis in the anterior tibial.  After review of these images the sheath is pulled into the left external iliac oblique of the common femoral is obtained and a Star close device deployed. There no immediate Complications.  Findings:   The abdominal aorta is opacified with bolus injection of contrast.  There are no hemodynamically significant lesions noted.  Single renal arteries are noted with no evidence of hemodynamically significant renal artery stenosis.  The  bilateral common internal and external iliac arteries are widely patent without hemodynamically significant stenosis.  The right common femoral is patent there is mild calcific disease but there are no hemodynamically significant stenoses.  The profunda femoris is patent but demonstrates fairly significant diffuse distal disease and collateralizes very poorly down to the distal thigh and around the knee.  The superficial femoral artery is patent in its proximal two thirds without hemodynamically significant stenosis.  At [facility] canal there is a previously placed stent which is occluded the mid and distal popliteal artery is occluded as is the trifurcation.  There is complete nonvisualization of the tibioperoneal  trunk peroneal and posterior tibial arteries throughout their entirety.  The anterior tibial artery is reconstituted several centimeters distal to the origin and is patent without hemodynamically significant stenosis down to the ankle where it occludes however there is a large collateral that extends from the terminus of the distal anterior tibial over to the plantar vessels.  The dorsalis pedis is nonvisualized.  Following thrombectomy with the [place] Rex device there is now recanalization of the SFA and popliteal  Follow-up imaging now demonstrated the SFA popliteal and anterior tibial were patent there is less than 10% residual stenosis throughout the SFA and popliteal there is less than 20% residual stenosis in the anterior tibial.  Summary: Successful recanalization right lower extremity for limb salvage                           Disposition: Patient was taken to the recovery room in stable condition having tolerated the procedure well.  Belenda Cruise Sherree Shankman 04/10/2021,2:31 PM

## 2021-04-11 ENCOUNTER — Other Ambulatory Visit (INDEPENDENT_AMBULATORY_CARE_PROVIDER_SITE_OTHER): Payer: Self-pay | Admitting: Vascular Surgery

## 2021-04-11 ENCOUNTER — Encounter: Payer: Self-pay | Admitting: Vascular Surgery

## 2021-04-11 DIAGNOSIS — I70221 Atherosclerosis of native arteries of extremities with rest pain, right leg: Secondary | ICD-10-CM

## 2021-04-11 DIAGNOSIS — Z95820 Peripheral vascular angioplasty status with implants and grafts: Secondary | ICD-10-CM

## 2021-04-12 ENCOUNTER — Encounter (INDEPENDENT_AMBULATORY_CARE_PROVIDER_SITE_OTHER): Payer: Medicare HMO

## 2021-04-12 ENCOUNTER — Ambulatory Visit (INDEPENDENT_AMBULATORY_CARE_PROVIDER_SITE_OTHER): Payer: Medicare HMO | Admitting: Vascular Surgery

## 2021-04-13 ENCOUNTER — Encounter (INDEPENDENT_AMBULATORY_CARE_PROVIDER_SITE_OTHER): Payer: Self-pay | Admitting: Vascular Surgery

## 2021-04-16 ENCOUNTER — Other Ambulatory Visit: Payer: Self-pay

## 2021-04-16 ENCOUNTER — Ambulatory Visit (INDEPENDENT_AMBULATORY_CARE_PROVIDER_SITE_OTHER): Payer: Medicare HMO

## 2021-04-16 ENCOUNTER — Encounter (INDEPENDENT_AMBULATORY_CARE_PROVIDER_SITE_OTHER): Payer: Self-pay | Admitting: Vascular Surgery

## 2021-04-16 ENCOUNTER — Ambulatory Visit (INDEPENDENT_AMBULATORY_CARE_PROVIDER_SITE_OTHER): Payer: Medicare HMO | Admitting: Vascular Surgery

## 2021-04-16 VITALS — BP 144/82 | HR 77 | Resp 16

## 2021-04-16 DIAGNOSIS — E1159 Type 2 diabetes mellitus with other circulatory complications: Secondary | ICD-10-CM

## 2021-04-16 DIAGNOSIS — J449 Chronic obstructive pulmonary disease, unspecified: Secondary | ICD-10-CM

## 2021-04-16 DIAGNOSIS — Z95820 Peripheral vascular angioplasty status with implants and grafts: Secondary | ICD-10-CM | POA: Diagnosis not present

## 2021-04-16 DIAGNOSIS — I10 Essential (primary) hypertension: Secondary | ICD-10-CM

## 2021-04-16 DIAGNOSIS — E782 Mixed hyperlipidemia: Secondary | ICD-10-CM

## 2021-04-16 DIAGNOSIS — I70223 Atherosclerosis of native arteries of extremities with rest pain, bilateral legs: Secondary | ICD-10-CM | POA: Diagnosis not present

## 2021-04-16 DIAGNOSIS — I70221 Atherosclerosis of native arteries of extremities with rest pain, right leg: Secondary | ICD-10-CM

## 2021-04-16 DIAGNOSIS — Z794 Long term (current) use of insulin: Secondary | ICD-10-CM

## 2021-04-20 ENCOUNTER — Encounter (INDEPENDENT_AMBULATORY_CARE_PROVIDER_SITE_OTHER): Payer: Self-pay | Admitting: Vascular Surgery

## 2021-04-20 DIAGNOSIS — I7025 Atherosclerosis of native arteries of other extremities with ulceration: Secondary | ICD-10-CM | POA: Insufficient documentation

## 2021-04-20 DIAGNOSIS — I70229 Atherosclerosis of native arteries of extremities with rest pain, unspecified extremity: Secondary | ICD-10-CM | POA: Insufficient documentation

## 2021-04-20 NOTE — Progress Notes (Signed)
MRN : [id number]  [name] is a 80 y.o. (Apr 30, 1941) female who presents with chief complaint of check legs.  History of Present Illness:   The patient returns to the office for followup and review status post angiogram with intervention. The patient's husband notes improvement in the right lower extremity symptoms. She seems to be c/o less rest pain symptoms. Previous wounds are stable.  No new ulcers or wounds have occurred since the last visit.  There have been no significant changes to the patient's overall health care.  The patient denies amaurosis fugax or recent TIA symptoms. There are no recent neurological changes noted. The patient denies history of DVT, PE or superficial thrombophlebitis. The patient denies recent episodes of angina or shortness of breath.   ABI's Rt=1.70 and Lt=1.59  (previous ABI's Rt=0.11 and Lt=1.89)   Current Meds  Medication Sig   acetaminophen (TYLENOL) 500 MG tablet Take 1,000 mg by mouth every 6 (six) hours as needed for mild pain.   amLODipine (NORVASC) 10 MG tablet Take 10 mg by mouth daily.   aspirin EC 81 MG EC tablet Take 1 tablet (81 mg total) by mouth daily.   atorvastatin (LIPITOR) 20 MG tablet Take 1 tablet (20 mg total) by mouth daily. (Patient taking differently: Take 20 mg by mouth at bedtime.)   budesonide (PULMICORT) 0.25 MG/2ML nebulizer solution Take 2 mLs (0.25 mg total) by nebulization 2 (two) times daily.   carbamide peroxide (DEBROX) 6.5 % OTIC solution Place 5 drops into both ears 2 (two) times daily as needed (ear wax).   Carboxymethylcellul-Glycerin (LUBRICATING EYE DROPS OP) Place 1 drop into both eyes daily as needed (dry eyes).   carvedilol (COREG) 6.25 MG tablet Take 6.25 mg by mouth 2 (two) times daily with a meal.   cetirizine (ZYRTEC) 10 MG tablet Take 10 mg by mouth daily.   cholecalciferol (VITAMIN D3) 25 MCG (1000 UNIT) tablet Take 1,000 Units by mouth daily.   clopidogrel (PLAVIX) 75 MG tablet Take 1 tablet  (75 mg total) by mouth daily with breakfast.   diclofenac Sodium (VOLTAREN) 1 % GEL Apply 1 application topically 4 (four) times daily as needed (pain).   docusate sodium (COLACE) 100 MG capsule Take 100 mg by mouth daily.   fluticasone (FLONASE) 50 MCG/ACT nasal spray Place 1 spray into both nostrils daily as needed for allergies.    gabapentin (NEURONTIN) 300 MG capsule Take 300 mg by mouth at bedtime.   hydrALAZINE (APRESOLINE) 25 MG tablet Take 1 tablet (25 mg total) by mouth every 8 (eight) hours.   insulin glargine (LANTUS) 100 UNIT/ML injection Inject 0.1 mLs (10 Units total) into the skin at bedtime. (Patient taking differently: Inject 18 Units into the skin at bedtime.)   magnesium hydroxide (MILK OF MAGNESIA) 400 MG/5ML suspension Take 15 mLs by mouth daily as needed for mild constipation.   magnesium oxide (MAG-OX) 400 MG tablet Take 400 mg by mouth 2 (two) times daily.    modafinil (PROVIGIL) 200 MG tablet Take 200 mg by mouth daily.   NYAMYC powder Apply 1 application topically daily.   omeprazole (PRILOSEC) 20 MG capsule Take 1 capsule (20 mg total) by mouth daily.   polyethylene glycol (MIRALAX / GLYCOLAX) packet Take 17 g by mouth 2 (two) times daily. (Patient taking differently: Take 17 g by mouth daily as needed for moderate constipation.)   potassium chloride SA (K-DUR,KLOR-CON) 20 MEQ tablet Take 20 mEq by mouth daily.   torsemide (DEMADEX) 20  MG tablet Take 1 tablet (20 mg total) by mouth 2 (two) times daily.   Vitamin E 180 MG CAPS Take 180 mg by mouth daily.    Past Medical History:  Diagnosis Date   Acute heart failure (HCC)    Aortic stenosis    CHF (congestive heart failure) (HCC)    Complication of anesthesia    Hard to wake patient up after having anesthesia   Diabetes mellitus without complication ([place])    Diabetic neuropathy ([place])    Takes Lyrica   Edema of both legs    Takes Lasix   GERD (gastroesophageal reflux disease)    High cholesterol    HTN  (hypertension)    Hypertension    PAD (peripheral artery disease) (HCC)    Shortness of breath dyspnea    Spasm of back muscles    Stroke (HCC)    Wound, open    Right great toe    Past Surgical History:  Procedure Laterality Date   AMPUTATION TOE Left 06/09/2019   Procedure: Left third toe and partial great toe amputation and debridement;  Surgeon: Katha Cabal, MD;  Location: [facility];  Service: Vascular;  Laterality: Left;   AMPUTATION TOE Left 04/06/2020   Procedure: AMPUTATION LEFT SECOND TOE;  Surgeon: Samara Deist, DPM;  Location: [facility];  Service: Podiatry;  Laterality: Left;   BYPASS GRAFT POPLITEAL TO TIBIAL Right 02/28/2016   Procedure: BYPASS GRAFT RIGHT BELOW KNEE POPLITEAL TO PERONEAL USING REVERSED RIGHT GREATER SAPHENOUS VEIN;  Surgeon: Conrad Pioneer, MD;  Location: [place];  Service: Vascular;  Laterality: Right;   CYST EXCISION     Abdomen   EYE SURGERY Bilateral    Cataract removal   INTRAMEDULLARY (IM) NAIL INTERTROCHANTERIC Left 02/04/2014   Procedure: INTRAMEDULLARY (IM) NAIL INTERTROCHANTRIC FEMORAL;  Surgeon: Mauri Pole, MD;  Location: [place];  Service: Orthopedics;  Laterality: Left;   IR GENERIC HISTORICAL  03/01/2016   IR ANGIO INTRA EXTRACRAN SEL COM CAROTID INNOMINATE UNI R MOD SED 03/01/2016 [name], MD [facility]   IR GENERIC HISTORICAL  03/01/2016   IR ENDOVASC INTRACRANIAL INF OTHER THAN THROMBO ART INC DIAG ANGIO 03/01/2016 [name], MD [facility]   IR GENERIC HISTORICAL  03/01/2016   IR INTRAVSC STENT CERV CAROTID W/O EMB-PROT MOD SED INC ANGIO 03/01/2016 [name], MD [facility]   IR GENERIC HISTORICAL  06/03/2016   IR RADIOLOGIST EVAL & MGMT 06/03/2016 [facility]   LOWER EXTREMITY ANGIOGRAPHY Left 02/09/2019   Procedure: LOWER EXTREMITY ANGIOGRAPHY;  Surgeon: Katha Cabal, MD;  Location: [place] [facility];  Service: Cardiovascular;  Laterality: Left;   LOWER EXTREMITY ANGIOGRAPHY Left 03/10/2019    Procedure: LOWER EXTREMITY ANGIOGRAPHY;  Surgeon: Katha Cabal, MD;  Location: [place] [facility];  Service: Cardiovascular;  Laterality: Left;   LOWER EXTREMITY ANGIOGRAPHY Left 04/27/2019   Procedure: LOWER EXTREMITY ANGIOGRAPHY;  Surgeon: Katha Cabal, MD;  Location: [place] [facility];  Service: Cardiovascular;  Laterality: Left;   LOWER EXTREMITY ANGIOGRAPHY Left 06/08/2019   Procedure: Lower Extremity Angiography;  Surgeon: Katha Cabal, MD;  Location: [place] [facility];  Service: Cardiovascular;  Laterality: Left;   LOWER EXTREMITY ANGIOGRAPHY Left 04/03/2020   Procedure: Lower Extremity Angiography;  Surgeon: Algernon Huxley, MD;  Location: [place] [facility];  Service: Cardiovascular;  Laterality: Left;   LOWER EXTREMITY ANGIOGRAPHY Right 10/20/2020   Procedure: Lower Extremity Angiography;  Surgeon: Katha Cabal, MD;  [facility]: [facility]  INV[facility];  Service: Cardiovascular;  Laterality: Right;   LOWER EXTREMITY ANGIOGRAPHY Left 01/23/2021   Procedure: LOWER EXTREMITY ANGIOGRAPHY;  Surgeon: Katha Cabal, MD;  Location: [place] [facility];  Service: Cardiovascular;  Laterality: Left;   LOWER EXTREMITY ANGIOGRAPHY Right 04/10/2021   Procedure: LOWER EXTREMITY ANGIOGRAPHY;  Surgeon: Katha Cabal, MD;  Location: [place] [facility];  Service: Cardiovascular;  Laterality: Right;   ORIF TOE FRACTURE Right 02/08/2014   Procedure: OPEN REDUCTION INTERNAL FIXATION Right METATARSAL  FRACTURE ;  Surgeon: Wylene Simmer, MD;  Location: [place];  Service: Orthopedics;  Laterality: Right;   PERIPHERAL VASCULAR CATHETERIZATION N/A 12/28/2015   Procedure: Abdominal Aortogram w/Lower Extremity;  Surgeon: Conrad Meadow, MD;  Location: [place] [facility];  Service: Cardiovascular;  Laterality: N/A;   RADIOLOGY WITH ANESTHESIA N/A 03/01/2016   Procedure: RADIOLOGY WITH ANESTHESIA;  Surgeon: [name], MD;  Location: [place];  Service: Radiology;  Laterality: N/A;   VEIN  HARVEST Right 02/28/2016   Procedure: RIGHT GREATER SAPHENOUS VEIN HARVEST;  Surgeon: Conrad , MD;  [facility];  Service: Vascular;  Laterality: Right;    Social History Social History   Tobacco Use   Smoking status: Never   Smokeless tobacco: Never   Tobacco comments:    Never smoked  Substance Use Topics   Alcohol use: No    Alcohol/week: 0.0 standard drinks   Drug use: No    Family History Family History  Problem Relation Age of Onset   Diabetes Other    Liver disease Mother    CVA Father    Diabetes Father    Hypertension Father     Allergies  Allergen Reactions   Iodine Anaphylaxis   Penicillins Anaphylaxis    Tolerates ceftriaxone, cefazolin Did it involve swelling of the face/tongue/throat, SOB, or low BP? Unknown Did it involve sudden or severe rash/hives, skin peeling, or any reaction on the inside of your mouth or nose? Unknown Did you need to seek medical attention at a hospital or doctor's office? Unknown When did it last happen?      Unknown If all above answers are "NO", may proceed with cephalosporin use.    Shellfish Allergy Anaphylaxis   Sulfa Antibiotics Anaphylaxis   Sulfacetamide Sodium Anaphylaxis   Sulfasalazine Anaphylaxis   Eggs Or Egg-Derived Products     SHORTNESS OF BREATH   Morphine And Related Other (See Comments)    Altered mental status     REVIEW OF SYSTEMS (Negative unless checked)  Constitutional: '[]'$ Weight loss  '[]'$ Fever  '[]'$ Chills Cardiac: '[]'$ Chest pain   '[]'$ Chest pressure   '[]'$ Palpitations   '[]'$ Shortness of breath when laying flat   '[]'$ Shortness of breath with exertion. Vascular:  '[]'$ Pain in legs with walking   '[]'$ Pain in legs at rest  '[]'$ History of DVT   '[]'$ Phlebitis   '[]'$ Swelling in legs   '[]'$ Varicose veins   '[]'$ Non-healing ulcers Pulmonary:   '[]'$ Uses home oxygen   '[]'$ Productive cough   '[]'$ Hemoptysis   '[]'$ Wheeze  '[]'$ COPD   '[]'$ Asthma Neurologic:  '[]'$ Dizziness   '[]'$ Seizures   '[]'$ History of stroke   '[]'$ History of TIA  '[]'$ Aphasia   '[]'$ Vissual  changes   '[]'$ Weakness or numbness in arm   '[]'$ Weakness or numbness in leg Musculoskeletal:   '[]'$ Joint swelling   '[]'$ Joint pain   '[]'$ Low back pain Hematologic:  '[]'$ Easy bruising  '[]'$ Easy bleeding   '[]'$ Hypercoagulable state   '[]'$ Anemic Gastrointestinal:  '[]'$ Diarrhea   '[]'$ Vomiting  '[]'$ Gastroesophageal reflux/heartburn   '[]'$ Difficulty swallowing. Genitourinary:  '[]'$   Chronic kidney disease   '[]'$ Difficult urination  '[]'$ Frequent urination   '[]'$ Blood in urine Skin:  '[]'$ Rashes   '[]'$ Ulcers  Psychological:  '[]'$ History of anxiety   '[]'$  History of major depression.  Physical Examination  Vitals:   04/16/21 1027  BP: (!) 144/82  Pulse: 77  Resp: 16   There is no height or weight on file to calculate BMI. Gen: WD/WN, NAD Head: [place]/AT, No temporalis wasting.  Ear/Nose/Throat: Hearing grossly intact, nares w/o erythema or drainage Eyes: PER, EOMI, sclera nonicteric.  Neck: Supple, no masses.  No bruit or JVD.  Pulmonary:  Good air movement, no audible wheezing, no use of accessory muscles.  Cardiac: RRR, normal S1, S2, no Murmurs. Vascular:   ulcers noninfected Vessel Right Left  Radial Palpable Palpable  Carotid Palpable Palpable  PT Not Palpable Not Palpable  DP Not Palpable Not Palpable  Gastrointestinal: soft, non-distended. No guarding/no peritoneal signs.  Musculoskeletal: M/S 5/5 throughout.  No visible deformity.  Neurologic: CN 2-12 intact. Pain and light touch intact in extremities.  Symmetrical.  Speech is fluent. Motor exam as listed above. Psychiatric: Judgment intact, Mood & affect appropriate for pt's clinical situation. Dermatologic: No rashes or ulcers noted.  No changes consistent with cellulitis.   CBC Lab Results  Component Value Date   WBC 4.3 03/10/2021   HGB 9.8 (L) 03/10/2021   HCT 31.4 (L) 03/10/2021   MCV 81.6 03/10/2021   PLT 148 (L) 03/10/2021    BMET    Component Value Date/Time   NA 141 03/10/2021 0629   K 3.4 (L) 03/10/2021 0629   CL 107 03/10/2021 0629   CO2 27 03/10/2021  0629   GLUCOSE 151 (H) 03/10/2021 0629   BUN 12 04/10/2021 1027   CREATININE 0.43 (L) 04/10/2021 1027   CALCIUM 8.7 (L) 03/10/2021 0629   GFRNONAA >60 04/10/2021 1027   GFRAA >60 04/06/2020 0535   Estimated Creatinine Clearance: 50 mL/min (A) (by C-G formula based on SCr of 0.43 mg/dL (L)).  COAG Lab Results  Component Value Date   INR 1.1 03/06/2021   INR 1.0 10/19/2020   INR 1.1 03/31/2020    Radiology PERIPHERAL VASCULAR CATHETERIZATION  Result Date: 04/10/2021 See surgical note for result.  VAS [place] ABI WITH/WO TBI  Result Date: 04/19/2021  LOWER EXTREMITY DOPPLER STUDY Patient Name:  [name]  Date of Exam:   04/16/2021 Medical Rec #: [id number]      Accession #:    [id number] Date of Birth: Feb 12, 1941       Patient Gender: F Patient Age:   56 years Exam Location:  [facility] Procedure:      VAS [place] ABI WITH/WO TBI Referring Phys: [facility] --------------------------------------------------------------------------------  Indications: Peripheral artery disease. Other Factors: 03/10/2019 Left PTA popliteal and ATA.  Vascular Interventions: 04/27/2019: Ultrasound Guided access to the Distal Right                         Anterior Tibial Artery. PTA of the Right SFA and                         Popliteal Artery. Crosser Atherectomy with PTA of the                         Right ATA.  06/08/2019: PTA of the Left SFA and Popliteal Artery.                         PTA of the Left Anterior Tibial Artery.                         06/09/2019: Amputation of the Left First and Third Toe;                         04/10/21: Right SFA/popliteal & ATA                         thrombectomy/PTA/stent;. Limitations: Today's exam was limited due to patient in wheelchair. Patient              stated vomitting at end of exam resulting in no bilateral TBI or              left PPGs Performing Technologist: Blondell Reveal RT, RDMS, RVT  Examination Guidelines: A complete evaluation  includes at minimum, Doppler waveform signals and systolic blood pressure reading at the level of bilateral brachial, anterior tibial, and posterior tibial arteries, when vessel segments are accessible. Bilateral testing is considered an integral part of a complete examination. Photoelectric Plethysmograph (PPG) waveforms and toe systolic pressure readings are included as required and additional duplex testing as needed. Limited examinations for reoccurring indications may be performed as noted.  ABI Findings: +---------+------------------+-----+-------------------+--------+ Right    Rt Pressure (mmHg)IndexWaveform           Comment  +---------+------------------+-----+-------------------+--------+ Brachial 83                                                 +---------+------------------+-----+-------------------+--------+ ATA      141               1.70 monophasic                  +---------+------------------+-----+-------------------+--------+ PTA                             not detected                +---------+------------------+-----+-------------------+--------+ PERO     96                1.16 dampened monophasic         +---------+------------------+-----+-------------------+--------+ Great Toe                       dampened                    +---------+------------------+-----+-------------------+--------+ +---------+------------------+-----+-----------------+-------------+ Left     Lt Pressure (mmHg)IndexWaveform         Comment       +---------+------------------+-----+-----------------+-------------+ ATA      128               1.54 strong monophasic              +---------+------------------+-----+-----------------+-------------+ PTA                             not detected                   +---------+------------------+-----+-----------------+-------------+  PERO                            not detected                    +---------+------------------+-----+-----------------+-------------+ Great Toe                                        not performed +---------+------------------+-----+-----------------+-------------+ +-------+-----------+-----------+-------------+------------+ ABI/TBIToday's ABIToday's TBIPrevious ABI Previous TBI +-------+-----------+-----------+-------------+------------+ Right  1.70       NA                                   +-------+-----------+-----------+-------------+------------+ Left   1.54       NA         Not performed0.73         +-------+-----------+-----------+-------------+------------+ No significant change when compared to the previous exam on 03/29/21 mostly due to medial calcification.  Summary: Right: Unreliable ABI likely due to medial calcification. Decreased arterial flow based on tibial waveforms and great toe PPG. Left: Unreliable ABI likely due to medial calcification. Decreased arterial flow based on tibial waveforms.  *See table(s) above for measurements and observations.  Electronically signed by Hortencia Pilar MD on 04/19/2021 at 7:08:17 PM.    Final    VAS [place] ABI WITH/WO TBI  Result Date: 03/29/2021  LOWER EXTREMITY DOPPLER STUDY Patient Name:  [name]  Date of Exam:   03/29/2021 Medical Rec #: [id number]      Accession #:    [id number] Date of Birth: Jan 22, 1941       Patient Gender: F Patient Age:   55 years Exam Location:  [facility] Procedure:      VAS [place] ABI WITH/WO TBI Referring Phys: Eulogio Ditch --------------------------------------------------------------------------------   Comparison Study: 02/14/2021 Performing Technologist: Charlane Ferretti RT (R)(VS)  Examination Guidelines: A complete evaluation includes at minimum, Doppler waveform signals and systolic blood pressure reading at the level of bilateral brachial, anterior tibial, and posterior tibial arteries, when vessel segments are accessible. Bilateral testing is considered an  integral part of a complete examination. Photoelectric Plethysmograph (PPG) waveforms and toe systolic pressure readings are included as required and additional duplex testing as needed. Limited examinations for reoccurring indications may be performed as noted.  ABI Findings: +---------+------------------+-----+----------+--------+ Right    Rt Pressure (mmHg)IndexWaveform  Comment  +---------+------------------+-----+----------+--------+ Brachial 132                                       +---------+------------------+-----+----------+--------+ ATA      15                     monophasic         +---------+------------------+-----+----------+--------+ PTA                             absent             +---------+------------------+-----+----------+--------+ Great Toe48                0.36 Abnormal           +---------+------------------+-----+----------+--------+ +---------+------------------+-----+----------+---------+ Left     Lt Pressure (  mmHg)IndexWaveform  Comment   +---------+------------------+-----+----------+---------+ Brachial 120                                        +---------+------------------+-----+----------+---------+ ATA                             monophasicNC        +---------+------------------+-----+----------+---------+ PTA                             monophasic          +---------+------------------+-----+----------+---------+ Great Toe                                 Amputated +---------+------------------+-----+----------+---------+ +-------+-----------+-----------+------------+------------+ ABI/TBIToday's ABIToday's TBIPrevious ABIPrevious TBI +-------+-----------+-----------+------------+------------+ Right  .11        .36        .63         0            +-------+-----------+-----------+------------+------------+ Left   1.89       Amputated  .96         .73           +-------+-----------+-----------+------------+------------+ Right ABIs appear decreased compared to prior study on 02/14/2021. Left ABIs appear essentially unchanged compared to prior study on 02/14/2021.  Summary: Right: Resting right ankle-brachial index indicates critical limb ischemia. The right toe-brachial index is abnormal. Left: Resting left ankle-brachial index indicates noncompressible left lower extremity arteries. Left PTA velocity is 9 by duplex and undetected by ABI. ATA is non compressable. 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  *See table(s) above for measurements and observations.  Electronically signed by Hortencia Pilar MD on 03/29/2021 at 4:24:36 PM.    Final      Assessment/Plan 1. Atherosclerosis of native artery of both lower extremities with rest pain ([place]) Recommend:  The patient is status post successful angiogram with intervention.  The patient reports that the claudication symptoms and leg pain is essentially gone.   The patient denies lifestyle limiting changes at this point in time.  No further invasive studies, angiography or surgery at this time The patient should continue walking and begin a more formal exercise program.  The patient should continue antiplatelet therapy and aggressive treatment of the lipid abnormalities  Patient should undergo noninvasive studies as ordered. The patient will follow up with me after the studies.    - VAS [place] ABI WITH/WO TBI; Future  2. Primary hypertension Continue antihypertensive medications as already ordered, these medications have been reviewed and there are no changes at this time.   3. Chronic obstructive pulmonary disease, unspecified COPD type ([place]) Continue pulmonary medications and aerosols as already ordered, these medications have been reviewed and there are no changes at this time.    4. Type 2 diabetes mellitus with other circulatory complication, with long-term current use of insulin (HCC) Continue  hypoglycemic medications as already ordered, these medications have been reviewed and there are no changes at this time.  Hgb A1C to be monitored as already arranged by primary service   5. Mixed hyperlipidemia Continue statin as ordered and reviewed, no changes at this time     Hortencia Pilar, MD  04/20/2021 3:58 PM

## 2021-05-10 ENCOUNTER — Other Ambulatory Visit: Payer: Self-pay

## 2021-05-10 ENCOUNTER — Ambulatory Visit: Payer: Medicare HMO | Attending: Family Medicine

## 2021-05-10 DIAGNOSIS — R262 Difficulty in walking, not elsewhere classified: Secondary | ICD-10-CM | POA: Diagnosis present

## 2021-05-10 DIAGNOSIS — M6281 Muscle weakness (generalized): Secondary | ICD-10-CM | POA: Insufficient documentation

## 2021-05-10 DIAGNOSIS — R2681 Unsteadiness on feet: Secondary | ICD-10-CM | POA: Insufficient documentation

## 2021-05-10 DIAGNOSIS — R278 Other lack of coordination: Secondary | ICD-10-CM | POA: Diagnosis present

## 2021-05-10 DIAGNOSIS — R293 Abnormal posture: Secondary | ICD-10-CM | POA: Diagnosis present

## 2021-05-10 DIAGNOSIS — R2689 Other abnormalities of gait and mobility: Secondary | ICD-10-CM | POA: Diagnosis present

## 2021-05-10 NOTE — Therapy (Signed)
[place] MAIN [facility] SERVICES [address] [place], [place], [address] Phone: [phone]   Fax:  [phone]  Physical Therapy Evaluation  Patient Details  Name: [name] MRN: [id number] Date of Birth: 01-13-1941 Referring Provider (PT): [name]   Encounter Date: 05/10/2021   PT End of Session - 05/11/21 1327     Visit Number 1    Number of Visits 25    Date for PT Re-Evaluation 08/02/21    PT Start Time 8250    PT Stop Time 1528    PT Time Calculation (min) 55 min    Activity Tolerance Patient tolerated treatment well    Behavior During Therapy Flat affect             Past Medical History:  Diagnosis Date   Acute heart failure ([place])    Aortic stenosis    CHF (congestive heart failure) ([place])    Complication of anesthesia    Hard to wake patient up after having anesthesia   Diabetes mellitus without complication ([place])    Diabetic neuropathy ([place])    Takes Lyrica   Edema of both legs    Takes Lasix   GERD (gastroesophageal reflux disease)    High cholesterol    HTN (hypertension)    Hypertension    PAD (peripheral artery disease) ([place])    Shortness of breath dyspnea    Spasm of back muscles    Stroke ([place])    Wound, open    Right great toe    Past Surgical History:  Procedure Laterality Date   AMPUTATION TOE Left 06/09/2019   Procedure: Left third toe and partial great toe amputation and debridement;  Surgeon: [name] Cabal, MD;  Location: [facility];  Service: Vascular;  Laterality: Left;   AMPUTATION TOE Left 04/06/2020   Procedure: AMPUTATION LEFT SECOND TOE;  Surgeon: [name], DPM;  Location: [facility];  Service: Podiatry;  Laterality: Left;   BYPASS GRAFT POPLITEAL TO TIBIAL Right 02/28/2016   Procedure: BYPASS GRAFT RIGHT BELOW KNEE POPLITEAL TO PERONEAL USING REVERSED RIGHT GREATER SAPHENOUS VEIN;  Surgeon: [name] Rinard, MD;  Location: [place];  Service: Vascular;  Laterality: Right;   CYST  EXCISION     Abdomen   EYE SURGERY Bilateral    Cataract removal   INTRAMEDULLARY (IM) NAIL INTERTROCHANTERIC Left 02/04/2014   Procedure: INTRAMEDULLARY (IM) NAIL INTERTROCHANTRIC FEMORAL;  Surgeon: [name] Pole, MD;  Location: [place];  Service: Orthopedics;  Laterality: Left;   IR GENERIC HISTORICAL  03/01/2016   IR ANGIO INTRA EXTRACRAN SEL COM CAROTID INNOMINATE UNI R MOD SED 03/01/2016 [name] Bras, MD [facility]   IR GENERIC HISTORICAL  03/01/2016   IR ENDOVASC INTRACRANIAL INF OTHER THAN THROMBO ART INC DIAG ANGIO 03/01/2016 [name] Bras, MD [facility]   IR GENERIC HISTORICAL  03/01/2016   IR INTRAVSC STENT CERV CAROTID W/O EMB-PROT MOD SED INC ANGIO 03/01/2016 [name] Bras, MD [facility]   IR GENERIC HISTORICAL  06/03/2016   IR RADIOLOGIST EVAL & MGMT 06/03/2016 [facility]   LOWER EXTREMITY ANGIOGRAPHY Left 02/09/2019   Procedure: LOWER EXTREMITY ANGIOGRAPHY;  Surgeon: [name] Cabal, MD;  Location: [place] [facility];  Service: Cardiovascular;  Laterality: Left;   LOWER EXTREMITY ANGIOGRAPHY Left 03/10/2019   Procedure: LOWER EXTREMITY ANGIOGRAPHY;  Surgeon: [name] Cabal, MD;  Location: [place] [facility];  Service: Cardiovascular;  Laterality: Left;   LOWER EXTREMITY ANGIOGRAPHY Left 04/27/2019   Procedure: LOWER  EXTREMITY ANGIOGRAPHY;  Surgeon: [name] Cabal, MD;  Location: [place] [facility];  Service: Cardiovascular;  Laterality: Left;   LOWER EXTREMITY ANGIOGRAPHY Left 06/08/2019   Procedure: Lower Extremity Angiography;  Surgeon: [name] Cabal, MD;  Location: [place] [facility];  Service: Cardiovascular;  Laterality: Left;   LOWER EXTREMITY ANGIOGRAPHY Left 04/03/2020   Procedure: Lower Extremity Angiography;  Surgeon: [name] Huxley, MD;  Location: [place] [facility];  Service: Cardiovascular;  Laterality: Left;   LOWER EXTREMITY ANGIOGRAPHY Right 10/20/2020   Procedure: Lower Extremity Angiography;  Surgeon: [name] Cabal, MD;   Location: [place] [facility];  Service: Cardiovascular;  Laterality: Right;   LOWER EXTREMITY ANGIOGRAPHY Left 01/23/2021   Procedure: LOWER EXTREMITY ANGIOGRAPHY;  Surgeon: [name] Cabal, MD;  Location: [place] [facility];  Service: Cardiovascular;  Laterality: Left;   LOWER EXTREMITY ANGIOGRAPHY Right 04/10/2021   Procedure: LOWER EXTREMITY ANGIOGRAPHY;  Surgeon: [name] Cabal, MD;  Location: [place] [facility];  Service: Cardiovascular;  Laterality: Right;   ORIF TOE FRACTURE Right 02/08/2014   Procedure: OPEN REDUCTION INTERNAL FIXATION Right METATARSAL  FRACTURE ;  Surgeon: [name] Simmer, MD;  Location: [place];  Service: Orthopedics;  Laterality: Right;   PERIPHERAL VASCULAR CATHETERIZATION N/A 12/28/2015   Procedure: Abdominal Aortogram w/Lower Extremity;  Surgeon: [name] Myrtlewood, MD;  Location: [place] [facility];  Service: Cardiovascular;  Laterality: N/A;   RADIOLOGY WITH ANESTHESIA N/A 03/01/2016   Procedure: RADIOLOGY WITH ANESTHESIA;  Surgeon: [name] Bras, MD;  Location: [place];  Service: Radiology;  Laterality: N/A;   VEIN HARVEST Right 02/28/2016   Procedure: RIGHT GREATER SAPHENOUS VEIN HARVEST;  Surgeon: [name] , MD;  Location: [place];  Service: Vascular;  Laterality: Right;    There were no vitals filed for this visit.    Subjective Assessment - 05/10/21 1432     Subjective Pt presents with spouse, [name] Spouse provides information d/t pt cognition. Spouse pushes WC for pt for mobility. Pt must be transfered with hoyer lift. Pt's son lives nearby and assists pt's spouse with use of hoyer lift. Pt's spouse reports difficulty using hoyer lift by himself. He assist pt with bed mobility and does her wound care.    Patient is accompained by: Family member   william   Pertinent History Pt returns to [facility] outpatient due to deconditioning for core strengthening and working toward improving transfers, general mobility in her wheelchair.  Pt previously had to discontinue  PT d/t illness (per spouse, pt had seizure). Pt also with recent hx of COVID19. Pt is s/p successful recanalization of RLE for limb salvage as of 04/10/2021.  PMH includes acute HF, CHF, DM, neuropathy, GERD, HTN, PAD, stroke, COPD, ESBL, CVA, amputation of 2nd toe, pressure ulcer of sacral region stage II, amputation of 3rd toe, hypercapnic respiratory failure, peripheral neuropathy, gangrene of toe of both feet. Pt spouse provides information as pt poor historian. Pt has caregiver 5 days/week. Pt transfers with hoyer lift, with help of spouse and son. Pt has not stood in 3-4 years. Pt's spouse reports pt currently has sores on her bottom, but that he is treating them and was instructed in how by nursing. Pt wears briefs that spouse changes every 2.5 hours. Pt uses WC (spouse pushes chair) with seatbelt to prevent sliding from chair.    Limitations Sitting;Walking;Lifting;Standing;House hold activities   pt has not stood in 3-4 yrs   How long can you sit comfortably? Pt spouse reports 2 hours.  Must be careful d/t preventing and trying to heal wounds on bottom.    How long can you stand comfortably? unable    How long can you walk comfortably? Unable    Diagnostic tests 6/21 and 9/06 peripheral vascular catheterizatoin; 04/10/2021 Successful recanalization right lower extremity for limb salvage    Patient Stated Goals to strengthen pt's trunk to improve mobility in chair, assist with ADLs    Currently in Pain? Yes    Pain Score --   pt reports her bottom hurts her, d/t cognition does not provide further info   Pain Location Buttocks   pt spouse reports currently treating wound, that area on buttocks is "red" currently           EXAMINATION   PAIN: Pt reports that her "bottom hurts." D/t cognition pt unable to provide further information. Per pt spouse pt currently has wounds on her bottom that he is primarily treating. He reports nursing instructed him in how to treat pt's wounds. He reports he  changes pt's brief ever 2.5 hours. He uses hoyer lift at home to transfer pt and then assists her with bed mobility to take pressure off her bottom and to treat her wounds. PT instructs pt in red flag sx to watch out for and to seek emergency care should pt exhibit red flag sx. PT instructs spouse to follow-up with pt's doctor should her wounds not improve. Pt's spouse reports wounds have not worsened and are currently "a little red." He verbalizes understanding for all PT instruction.   POSTURE: Slouched posture, sacral sitting, protracted shoulders, forward head posture in wheelchair. Backrest is reclined.    PROM/AROM:  PROM/AROM of B shoulder abduction and flexion impaired to just below 90 deg. Pt reports pain with attempt to abduct/flex above 90 deg. Elbow flexion/extension, finger abduction/adduction WFL B  LE AROM: pt demonstrates ability to perform knee extension B through limited range. Pt unable to demonstrates AROM ankle DF/PF   STRENGTH:  Graded on a 0-5 scale Due to cognition formal testing difficult to perform. Pt exhibits AROM of BUEs (see above).  Grip strength 4-/5 B. UE strength possibly 4-/5 B overall, however, due to pt cognition/difficulty following commands unable to fully determine.   Trunk strength (PT applying lateral, AP, and PA resistance) 4-/5 laterally. 4-/5 with PA. <3/5 with AP.   BLE strength grossly 3-/5.   Integumentary: Pt appears to have healed scabs/former healed wounds throughout BLEs. Pt does have two open scabs on RLE on tips of 1st and 2nd toes. PT reports this to pt's spouse.  PT unable to assess pt's sacral region at this time d/t time limitations. PT again reinforces red flag sx, to follow up with pt's doctor regarding sacral wounds should they not improve. Pt aware and verbalizes understanding.   SENSATION: Difficult to formally assess d/t pt cognition. Pt does report she is able to feel light touch in BLEs.   FUNCTIONAL MOBILITY:  PT cues pt  to attempt seated crunch. Pt able to lift shoulders slightly off backrest but lacks core strength necessary to perform crunch.    BALANCE: Fair to poor seated balance in chair with PT providing perturbations (see above). Pt has belt on in chair to prevent sliding from WC.   OUTCOME MEASURES:   FOTO: 26 Pt spouse fills out on pt's behalf, d/t pt cognition. However, majority of FOTO questions not appropriate for pt as pt is nonweightbearing (questions ask about ambulation, etc). Due to this pt unlikely to  improve on FOTO score.     Objective measurements completed on examination: See above findings.      PT Short Term Goals - 05/11/21 1329       PT SHORT TERM GOAL #1   Title Patient will be independent in home exercise program to improve strength/mobility for better functional independence with ADLs.    Baseline 10/6: to be initiated    Time 6    Period Weeks    Status New    Target Date 06/21/21               PT Long Term Goals - 05/11/21 1330       PT LONG TERM GOAL #1   Title Patient will increase FOTO score to equal to or greater than 44 to demonstrate statistically significant improvement in mobility and quality of life.    Baseline 10/6: 26    Time 12    Period Weeks    Status New    Target Date 08/02/21      PT LONG TERM GOAL #2   Title Patient will roll L and R in bed with mod A for decreased caregiver burden for ADLs and toileting.    Baseline 10/6: per pt spouse, pt currently dependent    Time 12    Period Weeks    Status New    Target Date 08/02/21      PT LONG TERM GOAL #3   Title Patient will sit upright in WC, with BUE support and without leaning on backrest for 10 minutes for ADL performance and pressure relief.    Baseline 10/6: pt exhibits core contraction but unable to sit upright    Time 12    Period Weeks    Status New    Target Date 08/02/21      PT LONG TERM GOAL #4   Title Pt will demonstrate ability to utilize BUEs to weight-shift  to each side in WC and maintain weight shift for 30 seconds to assist in pressure relief and reduece risk of wound formation.    Baseline 10/6: pt currently unable    Time 12    Period Weeks    Status New    Target Date 08/02/21                    Plan - 05/11/21 1352     Clinical Impression Statement Patient is a 80 year old female returning to PT for deconditioning and core weakness.  PT examination reveals deficits in UE/LE and core strength, as well as deficits in pain with upper extremity AROM/PROM, and impaired seated balance and posture.  FOTO score indicates poor functional mobility, however, questions on FOTO survey not appropriate for patient as patient is nonweightbearing and questions asked about ambulatory tasks. Pt also with decreased cognition, where pt's spouse must provide information on pt's behalf. Impairments found on exam are impacting pt's ability to participate in ADLs, asisst with bed moblity, and is preventing pt from performing/assisting with effective pressure-relief techniques in her WC to prevent wound formation. The pt will benefit from further skilled physical therapy to improve strength, seated balance, and mobility in order to increase ability to perform ADLs and to assist with pressure relief techniques.    Personal Factors and Comorbidities Age;Time since onset of injury/illness/exacerbation;Comorbidity 3+;Past/Current Experience;Education;Social Background;Transportation;Sex;Fitness    Comorbidities diastolic congestive heart failure, diabetes mellitus, diabetic neuropathy, hypertension, hyperlipidemia, GERD, peripheral vascular disease, CVA. on 9/2 had amputation of L foot second toe MTP  joint and LLE revascularization, recent hx of COVID19    Examination-Activity Limitations Bathing;Hygiene/Grooming;Squat;Bed Mobility;Stairs;Lift;Bend;Locomotion Level;Stand;Caring for [employer];Toileting;Carry;Self  Feeding;Transfers;Continence;Dressing;Other;Sit    Examination-Participation Restrictions Cleaning;Shop;Community Activity;Meal Prep;Driving;Other    Stability/Clinical Decision Making Evolving/Moderate complexity    Clinical Decision Making High    Rehab Potential Poor    PT Frequency 2x / week    PT Duration 12 weeks    PT Treatment/Interventions ADLs/Self Care Home Management;DME Instruction;Functional mobility training;Therapeutic activities;Therapeutic exercise;Neuromuscular re-education;Patient/family education;Wheelchair mobility training;Passive range of motion;Energy conservation;Cryotherapy;Biofeedback;Electrical Stimulation;Iontophoresis 4mg /ml Dexamethasone;Moist Heat;Traction;Ultrasound;Stair training;Cognitive remediation;Balance training;Orthotic Fit/Training;Manual techniques;Manual lymph drainage;Splinting;Taping;Vasopneumatic Device;Vestibular;Visual/perceptual remediation/compensation;Gait training    PT Next Visit Plan Initiate strengthening exercises    PT Home Exercise Plan to be intitated    Consulted and Agree with Plan of Care Patient;Family member/caregiver    Family Member Consulted Spouse             Patient will benefit from skilled therapeutic intervention in order to improve the following deficits and impairments:  Decreased cognition, Decreased skin integrity, Impaired sensation, Decreased mobility, Postural dysfunction, Decreased activity tolerance, Decreased endurance, Decreased range of motion, Decreased strength, Decreased balance, Decreased safety awareness, Increased edema, Impaired flexibility, Decreased coordination, Cardiopulmonary status limiting activity, Abnormal gait, Impaired UE functional use, Impaired tone, Improper body mechanics, Pain, Difficulty walking, Decreased knowledge of use of DME, Hypomobility, Impaired perceived functional ability  Visit Diagnosis: Muscle weakness (generalized)  Abnormal posture  Other abnormalities of gait and  mobility     Problem List Patient Active Problem List   Diagnosis Date Noted   Atherosclerosis of native arteries of extremity with rest pain ([place]) 04/20/2021   COVID-19 virus infection 03/07/2021   Acute febrile illness 03/07/2021   Hypoalbuminemia 03/07/2021   Hyperglycemia due to diabetes mellitus ([place]) 03/07/2021   Elevated troponin 03/07/2021   Constipation 03/07/2021   COPD (chronic obstructive pulmonary disease) ([place]) 10/20/2020   GERD (gastroesophageal reflux disease) 10/20/2020   Physical deconditioning 10/20/2020   Cellulitis 10/19/2020   Necrotic toes (H[facility])    Insulin dependent diabetes mellitus type IA ([place])    Diabetic neuropathy ([facility])    Gangrene of toe of both feet ([place]) 06/06/2019   Peripheral arterial disease ([place]) 04/27/2019   ESBL (extended spectrum beta-lactamase) producing bacteria infection 12/25/2018   PVD (peripheral vascular disease) ([place]) 09/12/2016   Ulcer of right lower extremity ([place]) 08/29/2016   Acute on chronic diastolic heart failure ([place]) 07/08/2016   HTN (hypertension) 07/05/2016   Sensorineural hearing loss (SNHL), 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   Chronic diastolic CHF (congestive heart failure) ([place]) 03/10/2016   HLD (hyperlipidemia) 03/09/2016   Acute respiratory acidosis (H[facility]) 03/05/2016   Anemia, iron deficiency 03/03/2016   Cerebrovascular accident (CVA) due to thrombosis of precerebral artery ([place]) 03/01/2016   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 ([place]) 03/01/2016   Atherosclerosis of native arteries of the extremities with gangrene ([place]) 12/08/2015   Anemia, chronic disease 04/27/2014   Unspecified hereditary and idiopathic peripheral neuropathy 03/14/2014   DM2 (diabetes mellitus, type 2) ([place]) 02/14/2014   Aortic stenosis 02/04/2014   Closed left subtrochanteric femur fracture ([place]) 02/03/2014   Hip fracture, left ([place]) 02/03/2014   Fibula fracture 02/03/2014   MTP instability 02/03/2014   Hip fracture ([place])  02/03/2014    Zollie Pee, PT 05/11/2021, 2:10 PM   [facility] MAIN [facility] SERVICES [address] [place], [place], [address] Phone: [phone]   Fax:  [phone]  Name: [name] MRN: [id number] Date of Birth: [birth year]

## 2021-05-15 ENCOUNTER — Ambulatory Visit: Payer: Medicare HMO

## 2021-05-15 ENCOUNTER — Other Ambulatory Visit: Payer: Self-pay

## 2021-05-15 DIAGNOSIS — M6281 Muscle weakness (generalized): Secondary | ICD-10-CM | POA: Diagnosis not present

## 2021-05-15 DIAGNOSIS — R293 Abnormal posture: Secondary | ICD-10-CM

## 2021-05-15 DIAGNOSIS — R2689 Other abnormalities of gait and mobility: Secondary | ICD-10-CM

## 2021-05-15 NOTE — Therapy (Signed)
[place] MAIN [facility] SERVICES [address] [place], [place], [address] Phone: [phone]   Fax:  [phone]  Physical Therapy Treatment  Patient Details  Name: [name] MRN: [id number] Date of Birth: [birth year] Referring Provider (PT): Sharin Grave   Encounter Date: 05/15/2021   PT End of Session - 05/15/21 1930     Visit Number 2    Number of Visits 25    Date for PT Re-Evaluation 08/02/21    PT Start Time 47    PT Stop Time 4650    PT Time Calculation (min) 51 min    Equipment Utilized During Treatment Gait belt    Activity Tolerance Patient tolerated treatment well    Behavior During Therapy Flat affect             Past Medical History:  Diagnosis Date   Acute heart failure ([place])    Aortic stenosis    CHF (congestive heart failure) ([place])    Complication of anesthesia    Hard to wake patient up after having anesthesia   Diabetes mellitus without complication ([place])    Diabetic neuropathy ([place])    Takes Lyrica   Edema of both legs    Takes Lasix   GERD (gastroesophageal reflux disease)    High cholesterol    HTN (hypertension)    Hypertension    PAD (peripheral artery disease) ([place])    Shortness of breath dyspnea    Spasm of back muscles    Stroke ([place])    Wound, open    Right great toe    Past Surgical History:  Procedure Laterality Date   AMPUTATION TOE Left 06/09/2019   Procedure: Left third toe and partial great toe amputation and debridement;  Surgeon: Katha Cabal, MD;  Location: [facility];  Service: Vascular;  Laterality: Left;   AMPUTATION TOE Left 04/06/2020   Procedure: AMPUTATION LEFT SECOND TOE;  Surgeon: Samara Deist, DPM;  Location: [facility];  Service: Podiatry;  Laterality: Left;   BYPASS GRAFT POPLITEAL TO TIBIAL Right 02/28/2016   Procedure: BYPASS GRAFT RIGHT BELOW KNEE POPLITEAL TO PERONEAL USING REVERSED RIGHT GREATER SAPHENOUS VEIN;  Surgeon: Conrad [place], MD;  Location: [place];   Service: Vascular;  Laterality: Right;   CYST EXCISION     Abdomen   EYE SURGERY Bilateral    Cataract removal   INTRAMEDULLARY (IM) NAIL INTERTROCHANTERIC Left 02/04/2014   Procedure: INTRAMEDULLARY (IM) NAIL INTERTROCHANTRIC FEMORAL;  Surgeon: Mauri Pole, MD;  Location: [facility];  Service: Orthopedics;  Laterality: Left;   IR GENERIC HISTORICAL  03/01/2016   IR ANGIO INTRA EXTRACRAN SEL COM CAROTID INNOMINATE UNI R MOD SED 03/01/2016 [name], MD [facility]   IR GENERIC HISTORICAL  03/01/2016   IR ENDOVASC INTRACRANIAL INF OTHER THAN THROMBO ART INC DIAG ANGIO 03/01/2016 [name], MD [facility]   IR GENERIC HISTORICAL  03/01/2016   IR INTRAVSC STENT CERV CAROTID W/O EMB-PROT MOD SED INC ANGIO 03/01/2016 [name], MD [facility]   IR GENERIC HISTORICAL  06/03/2016   IR RADIOLOGIST EVAL & MGMT 06/03/2016 [facility]   LOWER EXTREMITY ANGIOGRAPHY Left 02/09/2019   Procedure: LOWER EXTREMITY ANGIOGRAPHY;  Surgeon: Katha Cabal, MD;  Location: [place] [facility];  Service: Cardiovascular;  Laterality: Left;   LOWER EXTREMITY ANGIOGRAPHY Left 03/10/2019   Procedure: LOWER EXTREMITY ANGIOGRAPHY;  Surgeon: Katha Cabal, MD;  Location: [place] [facility];  Service: Cardiovascular;  Laterality: Left;  LOWER EXTREMITY ANGIOGRAPHY Left 04/27/2019   Procedure: LOWER EXTREMITY ANGIOGRAPHY;  Surgeon: Katha Cabal, MD;  Location: [place] [facility];  Service: Cardiovascular;  Laterality: Left;   LOWER EXTREMITY ANGIOGRAPHY Left 06/08/2019   Procedure: Lower Extremity Angiography;  Surgeon: Katha Cabal, MD;  Location: [place] [facility];  Service: Cardiovascular;  Laterality: Left;   LOWER EXTREMITY ANGIOGRAPHY Left 04/03/2020   Procedure: Lower Extremity Angiography;  Surgeon: Algernon Hux[place], MD;  Location: [place] [facility];  Service: Cardiovascular;  Laterality: Left;   LOWER EXTREMITY ANGIOGRAPHY Right 10/20/2020   Procedure: Lower Extremity  Angiography;  Surgeon: Katha Cabal, MD;  Location: [place] [facility];  Service: Cardiovascular;  Laterality: Right;   LOWER EXTREMITY ANGIOGRAPHY Left 01/23/2021   Procedure: LOWER EXTREMITY ANGIOGRAPHY;  Surgeon: Katha Cabal, MD;  Location: [place] [facility];  Service: Cardiovascular;  Laterality: Left;   LOWER EXTREMITY ANGIOGRAPHY Right 04/10/2021   Procedure: LOWER EXTREMITY ANGIOGRAPHY;  Surgeon: Katha Cabal, MD;  Location: [place] [facility];  Service: Cardiovascular;  Laterality: Right;   ORIF TOE FRACTURE Right 02/08/2014   Procedure: OPEN REDUCTION INTERNAL FIXATION Right METATARSAL  FRACTURE ;  Surgeon: Wylene Simmer, MD;  Location: [place] [facility];  Service: Orthopedics;  Laterality: Right;   PERIPHERAL VASCULAR CATHETERIZATION N/A 12/28/2015   Procedure: Abdominal Aortogram w/Lower Extremity;  Surgeon: Conrad [place], MD;  Location: [place] [facility];  Service: Cardiovascular;  Laterality: N/A;   RADIOLOGY WITH ANESTHESIA N/A 03/01/2016   Procedure: RADIOLOGY WITH ANESTHESIA;  Surgeon: [name], MD;  Location: [place];  Service: Radiology;  Laterality: N/A;   VEIN HARVEST Right 02/28/2016   Procedure: RIGHT GREATER SAPHENOUS VEIN HARVEST;  Surgeon: Conrad [place], MD;  Location: [place];  Service: Vascular;  Laterality: Right;    There were no vitals filed for this visit.   Subjective Assessment - 05/15/21 1501     Subjective Pt reports continued pain on bottom. Pt spouse present and has been continuing to treat red area in pt's sacral region.    Patient is accompained by: Family member   william   Pertinent History Pt returns to [facility] outpatient due to deconditioning for core strengthening and working toward improving transfers, general mobility in her wheelchair.  Pt previously had to discontinue PT d/t illness (per spouse, pt had seizure). Pt also with recent hx of COVID19. Pt is s/p successful recanalization of RLE for limb salvage as of 04/10/2021.  PMH includes acute  HF, CHF, DM, neuropathy, GERD, HTN, PAD, stroke, COPD, ESBL, CVA, amputation of 2nd toe, pressure ulcer of sacral region stage II, amputation of 3rd toe, hypercapnic respiratory failure, peripheral neuropathy, gangrene of toe of both feet. Pt spouse provides information as pt poor historian. Pt has caregiver 5 days/week. Pt transfers with hoyer lift, with help of spouse and son. Pt has not stood in 3-4 years. Pt's spouse reports pt currently has sores on her bottom, but that he is treating them and was instructed in how by nursing. Pt wears briefs that spouse changes every 2.5 hours. Pt uses WC (spouse pushes chair) with seatbelt to prevent sliding from chair.    Limitations Sitting;Walking;Lifting;Standing;House hold activities   pt has not stood in 3-4 yrs   How long can you sit comfortably? Pt spouse reports 2 hours. Must be careful d/t preventing and trying to heal wounds on bottom.    How long can you stand comfortably? unable    How long can you  walk comfortably? Unable    Diagnostic tests 6/21 and 9/06 peripheral vascular catheterizatoin; 04/10/2021 Successful recanalization right lower extremity for limb salvage    Patient Stated Goals to strengthen pt's trunk to improve mobility in chair, assist with ADLs    Currently in Pain? Yes    Pain Location Sacrum    Pain Onset More than a month ago               Interventions:  Patient is total assist for [facility] lift transfer from wheelchair to mat table, with 2 PTs assisting with transfer.  Total assist x2 to shift patient up further onto mat table for improved positioning.  Patient in supine.  PT then instructs patient for rolling to right side (patient requires max assist x1). PT asks pt and spouse if PT can examine reported wound on bottom. Pt and spouse agreeable to examination.  PT assists pt with doffing pants and briefs (spouse present).  Patient with bright reddened area of skin around sacrum extending to B sides of buttocks, no  breaks in skin observed or open wounds currently.  Patient spouse had applied a cream prior to PT to area to treat skin.  Patient exhibits bowel movement in briefs and PT assists spouse with cleaning patient.  Patient briefs and pants then donned again and PT assists patient with rolling back to supine max assist x1.  Pt then instructs pt through the following interventions:  Elbow flexion/biceps strengthening to promote strength necessary for rolling technique.  PT cues patient to flex elbow against manual resistance applied by PT and hold for 5 seconds at a time, patient performs 10-12 repetitions per side.  Shoulder flexion against manual resistance.  PT instructs patient to lift arm (shoulder flexion), patient limited to just below 90 degrees shoulder flexion bilaterally, and PT provides manual resistance as patient holds for 5 seconds.  Patient performs 8 repetitions each side.  Assisted heel slides-2 x 10 bilateral lower extremities.  Patient performs through reduced active range of motion, however, improves AROM with reps and cueing.  Mini oblique crunches with upper extremity pull into elbow flexion.  Patient begins in supine, PT instructs patient to reach across trunk to contralateral side and grab onto PTs hand.  PT then cues patient to perform mini oblique crunch toward PT and to pull on PT's hand simultaneously (for elbow flexion)  Patient performs 10 reps each side and holds and position for 3 seconds at a time. Pt able to lift shoulders partially off table for crunch.  Patient then assisted into [facility] lift for transfer back to transport chair from mat table. PT assists pt with positioning in chair.    note: Portions of this document were prepared using Dragon voice recognition software and although reviewed may contain unintentional dictation errors in syntax, grammar, or spelling.    PT Education - 05/15/21 1929     Education Details Exercise technique, body mechanics.  Instruct  patient and her caregiver (spouse) to follow-up with Dr. Regarding red area around sacral region/buttocks.    Person(s) Educated Patient;Spouse    Methods Explanation;Demonstration;Tactile cues;Verbal cues    Comprehension Verbalized understanding;Returned demonstration;Need further instruction              PT Short Term Goals - 05/11/21 1329       PT SHORT TERM GOAL #1   Title Patient will be independent in home exercise program to improve strength/mobility for better functional independence with ADLs.    Baseline 10/6: to be  initiated    Time 6    Period Weeks    Status New    Target Date 06/21/21               PT Long Term Goals - 05/11/21 1330       PT LONG TERM GOAL #1   Title Patient will increase FOTO score to equal to or greater than 44 to demonstrate statistically significant improvement in mobility and quality of life.    Baseline 10/6: 26    Time 12    Period Weeks    Status New    Target Date 08/02/21      PT LONG TERM GOAL #2   Title Patient will roll L and R in bed with mod A for decreased caregiver burden for ADLs and toileting.    Baseline 10/6: per pt spouse, pt currently dependent    Time 12    Period Weeks    Status New    Target Date 08/02/21      PT LONG TERM GOAL #3   Title Patient will sit upright in WC, with BUE support and without leaning on backrest for 10 minutes for ADL performance and pressure relief.    Baseline 10/6: pt exhibits core contraction but unable to sit upright    Time 12    Period Weeks    Status New    Target Date 08/02/21      PT LONG TERM GOAL #4   Title Pt will demonstrate ability to utilize BUEs to weight-shift to each side in WC and maintain weight shift for 30 seconds to assist in pressure relief and reduece risk of wound formation.    Baseline 10/6: pt currently unable    Time 12    Period Weeks    Status New    Target Date 08/02/21                   Plan - 05/15/21 1946     Clinical  Impression Statement Patient must use Hoyer lift for transfers from wheelchair to mat table for interventions.  With interventions on table patient requires min to max assist to complete strengthening, bed mobility.  During session patient was also found to have bright red area of skin around sacral region, in area pt reports pain (pt spouse has been treating this area to prevent wound development).  PT instructed patient and spouse to contact patient's physician regarding this.  Patient's spouse verbalized understanding.  The patient will benefit from further skilled PT to increase strength, mobility, and seated balance in order to increase safety and ease with ADLs.    Personal Factors and Comorbidities Age;Time since onset of injury/illness/exacerbation;Comorbidity 3+;Past/Current Experience;Education;Social Background;Transportation;Sex;Fitness    Comorbidities diastolic congestive heart failure, diabetes mellitus, diabetic neuropathy, hypertension, hyperlipidemia, GERD, peripheral vascular disease, CVA. on 9/2 had amputation of L foot second toe MTP joint and LLE revascularization, recent hx of COVID19    Examination-Activity Limitations Bathing;Hygiene/Grooming;Squat;Bed Mobility;Stairs;Lift;Bend;Locomotion Level;Stand;Caring for [employer];Toileting;Carry;Self Feeding;Transfers;Continence;Dressing;Other;Sit    Examination-Participation Restrictions Cleaning;Shop;Community Activity;Meal Prep;Driving;Other    Stability/Clinical Decision Making Evolving/Moderate complexity    Rehab Potential Poor    PT Frequency 2x / week    PT Duration 12 weeks    PT Treatment/Interventions ADLs/Self Care Home Management;DME Instruction;Functional mobility training;Therapeutic activities;Therapeutic exercise;Neuromuscular re-education;Patient/family education;Wheelchair mobility training;Passive range of motion;Energy conservation;Cryotherapy;Biofeedback;Electrical Stimulation;Iontophoresis 4mg /ml  Dexamethasone;Moist Heat;Traction;Ultrasound;Stair training;Cognitive remediation;Balance training;Orthotic Fit/Training;Manual techniques;Manual lymph drainage;Splinting;Taping;Vasopneumatic Device;Vestibular;Visual/perceptual remediation/compensation;Gait training    PT Next Visit Plan bed mobility, pressure relief  techniques, UE/LE strengthening and ROM, seated balance    PT Home Exercise Plan to be intitated next session    Consulted and Agree with Plan of Care Patient;Family member/caregiver    Family Member Consulted Spouse             Patient will benefit from skilled therapeutic intervention in order to improve the following deficits and impairments:  Decreased cognition, Decreased skin integrity, Impaired sensation, Decreased mobility, Postural dysfunction, Decreased activity tolerance, Decreased endurance, Decreased range of motion, Decreased strength, Decreased balance, Decreased safety awareness, Increased edema, Impaired flexibility, Decreased coordination, Cardiopulmonary status limiting activity, Abnormal gait, Impaired UE functional use, Impaired tone, Improper body mechanics, Pain, Difficulty walking, Decreased knowledge of use of DME, Hypomobility, Impaired perceived functional ability  Visit Diagnosis: Muscle weakness (generalized)  Other abnormalities of gait and mobility  Abnormal posture     Problem List Patient Active Problem List   Diagnosis Date Noted   Atherosclerosis of native arteries of extremity with rest pain ([place]) 04/20/2021   COVID-19 virus infection 03/07/2021   Acute febrile illness 03/07/2021   Hypoalbuminemia 03/07/2021   Hyperglycemia due to diabetes mellitus ([place]) 03/07/2021   Elevated troponin 03/07/2021   Constipation 03/07/2021   COPD (chronic obstructive pulmonary disease) ([place]) 10/20/2020   GERD (gastroesophageal reflux disease) 10/20/2020   Physical deconditioning 10/20/2020   Cellulitis 10/19/2020   Necrotic toes (HCC)    Insulin  dependent diabetes mellitus type IA ([place])    Diabetic neuropathy (H[facility])    Gangrene of toe of both feet ([place]) 06/06/2019   Peripheral arterial disease ([place]) 04/27/2019   ESBL (extended spectrum beta-lactamase) producing bacteria infection 12/25/2018   PVD (peripheral vascular disease) ([place]) 09/12/2016   Ulcer of right lower extremity ([place]) 08/29/2016   Acute on chronic diastolic heart failure ([place]) 07/08/2016   HTN (hypertension) 07/05/2016   Sensorineural hearing loss (SNHL), 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   Chronic diastolic CHF (congestive heart failure) ([place]) 03/10/2016   HLD (hyperlipidemia) 03/09/2016   Acute respiratory acidosis (HCC) 03/05/2016   Anemia, iron deficiency 03/03/2016   Cerebrovascular accident (CVA) due to thrombosis of precerebral artery ([place]) 03/01/2016   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 ([place]) 03/01/2016   Atherosclerosis of native arteries of the extremities with gangrene ([place]) 12/08/2015   Anemia, chronic disease 04/27/2014   Unspecified hereditary and idiopathic peripheral neuropathy 03/14/2014   DM2 (diabetes mellitus, type 2) ([place]) 02/14/2014   Aortic stenosis 02/04/2014   Closed left subtrochanteric femur fracture ([place]) 02/03/2014   Hip fracture, left ([place]) 02/03/2014   Fibula fracture 02/03/2014   MTP instability 02/03/2014   Hip fracture ([place]) 02/03/2014    Zollie Pee, PT 05/15/2021, 7:54 PM  [place] MAIN [facility] SERVICES [address] [place], [place], [address] Phone: [phone]   Fax:  [phone]  Name: [name] MRN: [id number] Date of Birth: 1941/03/13

## 2021-05-17 ENCOUNTER — Ambulatory Visit: Payer: Medicare HMO

## 2021-05-17 ENCOUNTER — Other Ambulatory Visit: Payer: Self-pay

## 2021-05-17 DIAGNOSIS — R278 Other lack of coordination: Secondary | ICD-10-CM

## 2021-05-17 DIAGNOSIS — R2689 Other abnormalities of gait and mobility: Secondary | ICD-10-CM

## 2021-05-17 DIAGNOSIS — R293 Abnormal posture: Secondary | ICD-10-CM

## 2021-05-17 DIAGNOSIS — M6281 Muscle weakness (generalized): Secondary | ICD-10-CM | POA: Diagnosis not present

## 2021-05-17 NOTE — Therapy (Signed)
[place] MAIN [facility] SERVICES [address] [place], [place], [address] Phone: [phone]   Fax:  [phone]  Physical Therapy Treatment  Patient Details  Name: [name] MRN: [id number] Date of Birth: 1940/10/24 Referring Provider (PT): Sharin Grave   Encounter Date: 05/17/2021   PT End of Session - 05/17/21 1705     Visit Number 3    Number of Visits 25    Date for PT Re-Evaluation 08/02/21    PT Start Time 1604    PT Stop Time 1648    PT Time Calculation (min) 44 min    Equipment Utilized During Treatment Gait belt    Activity Tolerance Patient tolerated treatment well    Behavior During Therapy Flat affect             Past Medical History:  Diagnosis Date   Acute heart failure ([place])    Aortic stenosis    CHF (congestive heart failure) ([place])    Complication of anesthesia    Hard to wake patient up after having anesthesia   Diabetes mellitus without complication ([place])    Diabetic neuropathy ([place])    Takes Lyrica   Edema of both legs    Takes Lasix   GERD (gastroesophageal reflux disease)    High cholesterol    HTN (hypertension)    Hypertension    PAD (peripheral artery disease) ([place])    Shortness of breath dyspnea    Spasm of back muscles    Stroke ([place])    Wound, open    Right great toe    Past Surgical History:  Procedure Laterality Date   AMPUTATION TOE Left 06/09/2019   Procedure: Left third toe and partial great toe amputation and debridement;  Surgeon: Katha Cabal, MD;  Location: [facility];  Service: Vascular;  Laterality: Left;   AMPUTATION TOE Left 04/06/2020   Procedure: AMPUTATION LEFT SECOND TOE;  Surgeon: Samara Deist, DPM;  Location: [facility];  Service: Podiatry;  Laterality: Left;   BYPASS GRAFT POPLITEAL TO TIBIAL Right 02/28/2016   Procedure: BYPASS GRAFT RIGHT BELOW KNEE POPLITEAL TO PERONEAL USING REVERSED RIGHT GREATER SAPHENOUS VEIN;  Surgeon: Conrad [place], MD;  Location: [place];   Service: Vascular;  Laterality: Right;   CYST EXCISION     Abdomen   EYE SURGERY Bilateral    Cataract removal   INTRAMEDULLARY (IM) NAIL INTERTROCHANTERIC Left 02/04/2014   Procedure: INTRAMEDULLARY (IM) NAIL INTERTROCHANTRIC FEMORAL;  Surgeon: Mauri Pole, MD;  Location: [place];  Service: Orthopedics;  Laterality: Left;   IR GENERIC HISTORICAL  03/01/2016   IR ANGIO INTRA EXTRACRAN SEL COM CAROTID INNOMINATE UNI R MOD SED 03/01/2016 [name], MD [facility]   IR GENERIC HISTORICAL  03/01/2016   IR ENDOVASC INTRACRANIAL INF OTHER THAN THROMBO ART INC DIAG ANGIO 03/01/2016 [name], MD [facility]   IR GENERIC HISTORICAL  03/01/2016   IR INTRAVSC STENT CERV CAROTID W/O EMB-PROT MOD SED INC ANGIO 03/01/2016 [name], MD [facility]   IR GENERIC HISTORICAL  06/03/2016   IR RADIOLOGIST EVAL & MGMT 06/03/2016 [facility]   LOWER EXTREMITY ANGIOGRAPHY Left 02/09/2019   Procedure: LOWER EXTREMITY ANGIOGRAPHY;  Surgeon: Katha Cabal, MD;  Location: [place] [facility];  Service: Cardiovascular;  Laterality: Left;   LOWER EXTREMITY ANGIOGRAPHY Left 03/10/2019   Procedure: LOWER EXTREMITY ANGIOGRAPHY;  Surgeon: Katha Cabal, MD;  Location: [place] [facility];  Service: Cardiovascular;  Laterality: Left;  LOWER EXTREMITY ANGIOGRAPHY Left 04/27/2019   Procedure: LOWER EXTREMITY ANGIOGRAPHY;  Surgeon: Katha Cabal, MD;  Location: [place] [facility];  Service: Cardiovascular;  Laterality: Left;   LOWER EXTREMITY ANGIOGRAPHY Left 06/08/2019   Procedure: Lower Extremity Angiography;  Surgeon: Katha Cabal, MD;  Location: [place] [facility];  Service: Cardiovascular;  Laterality: Left;   LOWER EXTREMITY ANGIOGRAPHY Left 04/03/2020   Procedure: Lower Extremity Angiography;  Surgeon: Algernon Hux[place], MD;  Location: [place] [facility];  Service: Cardiovascular;  Laterality: Left;   LOWER EXTREMITY ANGIOGRAPHY Right 10/20/2020   Procedure: Lower Extremity  Angiography;  Surgeon: Katha Cabal, MD;  Location: [place] [facility];  Service: Cardiovascular;  Laterality: Right;   LOWER EXTREMITY ANGIOGRAPHY Left 01/23/2021   Procedure: LOWER EXTREMITY ANGIOGRAPHY;  Surgeon: Katha Cabal, MD;  Location: [place] [facility];  Service: Cardiovascular;  Laterality: Left;   LOWER EXTREMITY ANGIOGRAPHY Right 04/10/2021   Procedure: LOWER EXTREMITY ANGIOGRAPHY;  Surgeon: Katha Cabal, MD;  Location: [place] [facility];  Service: Cardiovascular;  Laterality: Right;   ORIF TOE FRACTURE Right 02/08/2014   Procedure: OPEN REDUCTION INTERNAL FIXATION Right METATARSAL  FRACTURE ;  Surgeon: Wylene Simmer, MD;  Location: [place];  Service: Orthopedics;  Laterality: Right;   PERIPHERAL VASCULAR CATHETERIZATION N/A 12/28/2015   Procedure: Abdominal Aortogram w/Lower Extremity;  Surgeon: Conrad [place], MD;  Location: [place] [facility];  Service: Cardiovascular;  Laterality: N/A;   RADIOLOGY WITH ANESTHESIA N/A 03/01/2016   Procedure: RADIOLOGY WITH ANESTHESIA;  Surgeon: [name], MD;  Location: [place];  Service: Radiology;  Laterality: N/A;   VEIN HARVEST Right 02/28/2016   Procedure: RIGHT GREATER SAPHENOUS VEIN HARVEST;  Surgeon: Conrad [place], MD;  Location: [place];  Service: Vascular;  Laterality: Right;    There were no vitals filed for this visit.   Subjective Assessment - 05/17/21 1606     Subjective Pt spouse reports physician assistant has examined patient's reddened area of skin in sacral region, and instructed spouse on continued treatment of area. Pt reports some R knee pain.    Patient is accompained by: Family member   william   Pertinent History Pt returns to [facility] outpatient due to deconditioning for core strengthening and working toward improving transfers, general mobility in her wheelchair.  Pt previously had to discontinue PT d/t illness (per spouse, pt had seizure). Pt also with recent hx of COVID19. Pt is s/p successful  recanalization of RLE for limb salvage as of 04/10/2021.  PMH includes acute HF, CHF, DM, neuropathy, GERD, HTN, PAD, stroke, COPD, ESBL, CVA, amputation of 2nd toe, pressure ulcer of sacral region stage II, amputation of 3rd toe, hypercapnic respiratory failure, peripheral neuropathy, gangrene of toe of both feet. Pt spouse provides information as pt poor historian. Pt has caregiver 5 days/week. Pt transfers with hoyer lift, with help of spouse and son. Pt has not stood in 3-4 years. Pt's spouse reports pt currently has sores on her bottom, but that he is treating them and was instructed in how by nursing. Pt wears briefs that spouse changes every 2.5 hours. Pt uses WC (spouse pushes chair) with seatbelt to prevent sliding from chair.    Limitations Sitting;Walking;Lifting;Standing;House hold activities   pt has not stood in 3-4 yrs   How long can you sit comfortably? Pt spouse reports 2 hours. Must be careful d/t preventing and trying to heal wounds on bottom.    How long can you stand comfortably?  unable    How long can you walk comfortably? Unable    Diagnostic tests 6/21 and 9/06 peripheral vascular catheterizatoin; 04/10/2021 Successful recanalization right lower extremity for limb salvage    Patient Stated Goals to strengthen pt's trunk to improve mobility in chair, assist with ADLs    Currently in Pain? Yes    Pain Location Knee    Pain Orientation Right    Pain Onset More than a month ago              Interventions-  Pt seated in chair.  PT instructs pt through pressure-relief technique in [facility] with pt offloading weight with lateral weight shifts (pushing through BUEs off arm rests and attempting to off-load one LE) 10-15 times each direction.  PT provides demo in technique, verbal cues and tactile cues.  Patient exhibits correct technique but unable to lift lower extremity and glutes off seat at this time.  Seated assisted hip flexion marches 3 x 10 bilateral lower extremities.   Patient with less strength on right lower extremity compared to left lower extremity.  PT cues patient to control eccentric component/slow it down.  Patient exhibits only partial range of motion repetitions, but does improve range with reps.  Following seated hip flexion marches PT instructs patient to flex hip and weight shift to opposite side 5 times each direction for improved pressure relief technique. continued demo/VC/TC for technique.  Patient performs 3 x 10 LAQ's.  PT initially provides assistance to right lower extremity, however, patient able to perform actively without assistance after several repetitions.  Manually resisted hip abduction 3 x 10 with 2 to 3-second holds.  With 1 pound weighted bar patient performs partial range of motion shoulder flexion (within pain-free range) 3 x 12.  Patient does report some low back discomfort with exercise  Seated bicep curls with 1 pound weighted bar 3 x 12  Seated crunches 12 times with 1 to 3-second holds and frequent use of tactile cueing for technique.  Manually resisted lateral trunk flexion for improved core strengthening 5 times each side with 3-second holds.  PT instructs patient and spouse in HEP: Seated lateral pressure relief technique in wheelchair to be performed 5 days/week, 2 x 10 repetitions each side.  PT provides handout with picture and instruction in frequency sets and reps.  Patient and spouse verbalized understanding, patient was able to demo correct technique in session.   note: Portions of this document were prepared using Dragon voice recognition software and although reviewed may contain unintentional dictation errors in syntax, grammar, or spelling.    PT Education - 05/17/21 1705     Education Details Exercise technique, body mechanics, HEP    Person(s) Educated Patient;Spouse    Methods Explanation;Demonstration;Verbal cues;Handout;Tactile cues    Comprehension Verbalized understanding;Returned  demonstration;Verbal cues required;Tactile cues required;Need further instruction              PT Short Term Goals - 05/11/21 1329       PT SHORT TERM GOAL #1   Title Patient will be independent in home exercise program to improve strength/mobility for better functional independence with ADLs.    Baseline 10/6: to be initiated    Time 6    Period Weeks    Status New    Target Date 06/21/21               PT Long Term Goals - 05/11/21 1330       PT LONG TERM GOAL #1  Title Patient will increase FOTO score to equal to or greater than 44 to demonstrate statistically significant improvement in mobility and quality of life.    Baseline 10/6: 26    Time 12    Period Weeks    Status New    Target Date 08/02/21      PT LONG TERM GOAL #2   Title Patient will roll L and R in bed with mod A for decreased caregiver burden for ADLs and toileting.    Baseline 10/6: per pt spouse, pt currently dependent    Time 12    Period Weeks    Status New    Target Date 08/02/21      PT LONG TERM GOAL #3   Title Patient will sit upright in WC, with BUE support and without leaning on backrest for 10 minutes for ADL performance and pressure relief.    Baseline 10/6: pt exhibits core contraction but unable to sit upright    Time 12    Period Weeks    Status New    Target Date 08/02/21      PT LONG TERM GOAL #4   Title Pt will demonstrate ability to utilize BUEs to weight-shift to each side in WC and maintain weight shift for 30 seconds to assist in pressure relief and reduece risk of wound formation.    Baseline 10/6: pt currently unable    Time 12    Period Weeks    Status New    Target Date 08/02/21                   Plan - 05/17/21 1705     Clinical Impression Statement Patient highly motivated in session.  Interventions focused on pressure relief technique in wheelchair and UEs/LE and core strengthening.  Patient exhibits correct sequencing for pressure relief  technique, however, not yet fully able to offload lower extremity includes in chair due to decreased strength.  Patient will benefit from further skilled PT to increase strength, range of motion, mobility, and seated balance to increase ease and safety with ADLs.    Personal Factors and Comorbidities Age;Time since onset of injury/illness/exacerbation;Comorbidity 3+;Past/Current Experience;Education;Social Background;Transportation;Sex;Fitness    Comorbidities diastolic congestive heart failure, diabetes mellitus, diabetic neuropathy, hypertension, hyperlipidemia, GERD, peripheral vascular disease, CVA. on 9/2 had amputation of L foot second toe MTP joint and LLE revascularization, recent hx of COVID19    Examination-Activity Limitations Bathing;Hygiene/Grooming;Squat;Bed Mobility;Stairs;Lift;Bend;Locomotion Level;Stand;Caring for [employer];Toileting;Carry;Self Feeding;Transfers;Continence;Dressing;Other;Sit    Examination-Participation Restrictions Cleaning;Shop;Community Activity;Meal Prep;Driving;Other    Stability/Clinical Decision Making Evolving/Moderate complexity    Rehab Potential Poor    PT Frequency 2x / week    PT Duration 12 weeks    PT Treatment/Interventions ADLs/Self Care Home Management;DME Instruction;Functional mobility training;Therapeutic activities;Therapeutic exercise;Neuromuscular re-education;Patient/family education;Wheelchair mobility training;Passive range of motion;Energy conservation;Cryotherapy;Biofeedback;Electrical Stimulation;Iontophoresis 4mg /ml Dexamethasone;Moist Heat;Traction;Ultrasound;Stair training;Cognitive remediation;Balance training;Orthotic Fit/Training;Manual techniques;Manual lymph drainage;Splinting;Taping;Vasopneumatic Device;Vestibular;Visual/perceptual remediation/compensation;Gait training    PT Next Visit Plan bed mobility, pressure relief techniques, UE/LE strengthening and ROM, seated balance    PT Home Exercise Plan Seated lateral weight  shift pressure relief technique in wheelchair to be performed 5 days/week for 2 sets of 10 repetitions each side.  PT provided patient with picture of technique as well.    Consulted and Agree with Plan of Care Patient;Family member/caregiver    Family Member Consulted Spouse             Patient will benefit from skilled therapeutic intervention in order to improve the following deficits and  impairments:  Decreased cognition, Decreased skin integrity, Impaired sensation, Decreased mobility, Postural dysfunction, Decreased activity tolerance, Decreased endurance, Decreased range of motion, Decreased strength, Decreased balance, Decreased safety awareness, Increased edema, Impaired flexibility, Decreased coordination, Cardiopulmonary status limiting activity, Abnormal gait, Impaired UE functional use, Impaired tone, Improper body mechanics, Pain, Difficulty walking, Decreased knowledge of use of DME, Hypomobility, Impaired perceived functional ability  Visit Diagnosis: Muscle weakness (generalized)  Other abnormalities of gait and mobility  Abnormal posture  Other lack of coordination     Problem List Patient Active Problem List   Diagnosis Date Noted   Atherosclerosis of native arteries of extremity with rest pain ([place]) 04/20/2021   COVID-19 virus infection 03/07/2021   Acute febrile illness 03/07/2021   Hypoalbuminemia 03/07/2021   Hyperglycemia due to diabetes mellitus ([place]) 03/07/2021   Elevated troponin 03/07/2021   Constipation 03/07/2021   COPD (chronic obstructive pulmonary disease) ([place]) 10/20/2020   GERD (gastroesophageal reflux disease) 10/20/2020   Physical deconditioning 10/20/2020   Cellulitis 10/19/2020   Necrotic toes (H[facility])    Insulin dependent diabetes mellitus type IA ([place])    Diabetic neuropathy (H[facility])    Gangrene of toe of both feet ([place]) 06/06/2019   Peripheral arterial disease ([place]) 04/27/2019   ESBL (extended spectrum beta-lactamase) producing bacteria  infection 12/25/2018   PVD (peripheral vascular disease) ([place]) 09/12/2016   Ulcer of right lower extremity ([place]) 08/29/2016   Acute on chronic diastolic heart failure ([place]) 07/08/2016   HTN (hypertension) 07/05/2016   Sensorineural hearing loss (SNHL), 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   Chronic diastolic CHF (congestive heart failure) ([place]) 03/10/2016   HLD (hyperlipidemia) 03/09/2016   Acute respiratory acidosis (HCC) 03/05/2016   Anemia, iron deficiency 03/03/2016   Cerebrovascular accident (CVA) due to thrombosis of precerebral artery ([place]) 03/01/2016   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 ([place]) 03/01/2016   Atherosclerosis of native arteries of the extremities with gangrene ([place]) 12/08/2015   Anemia, chronic disease 04/27/2014   Unspecified hereditary and idiopathic peripheral neuropathy 03/14/2014   DM2 (diabetes mellitus, type 2) ([place]) 02/14/2014   Aortic stenosis 02/04/2014   Closed left subtrochanteric femur fracture ([place]) 02/03/2014   Hip fracture, left ([place]) 02/03/2014   Fibula fracture 02/03/2014   MTP instability 02/03/2014   Hip fracture ([place]) 02/03/2014    Zollie Pee, PT 05/17/2021, 5:09 PM  [place] MAIN [facility] SERVICES [address] [place], [place], [address] Phone: [phone]   Fax:  [phone]  Name: [name] MRN: [id number] Date of Birth: Dec 22, 1940

## 2021-05-17 NOTE — Therapy (Signed)
[place] MAIN [facility] SERVICES [address] [place], [place], [address] Phone: [phone]   Fax:  [phone]  Patient Details  Name: [name] MRN: [id number] Date of Birth: [birth year] Referring Provider:  No ref. provider found  Encounter Date: 05/17/2021  PT this a.m. contacted Dr. Quinn Plowman office via phone per pt's request regarding signs of wound development in pt's sacral region. They confirmed over the phone that they will be contacting the pt to get her set up with a wound care specialist.   Zollie Pee 05/17/2021, 9:10 AM  [place], [place], [address] Phone: [phone]   Fax:  [phone]

## 2021-05-21 ENCOUNTER — Ambulatory Visit: Payer: Medicare HMO

## 2021-05-21 ENCOUNTER — Other Ambulatory Visit: Payer: Self-pay

## 2021-05-21 DIAGNOSIS — M6281 Muscle weakness (generalized): Secondary | ICD-10-CM | POA: Diagnosis not present

## 2021-05-21 DIAGNOSIS — R2681 Unsteadiness on feet: Secondary | ICD-10-CM

## 2021-05-21 DIAGNOSIS — R262 Difficulty in walking, not elsewhere classified: Secondary | ICD-10-CM

## 2021-05-21 NOTE — Therapy (Deleted)
[place] MAIN [facility] SERVICES [address] [place], [place], [address] Phone: [phone]   Fax:  [phone]  Physical Therapy Treatment  Patient Details  Name: [name] MRN: [id number] Date of Birth: 17-Jan-1941 Referring Provider (PT): Sharin Grave   Encounter Date: 05/21/2021    Past Medical History:  Diagnosis Date   Acute heart failure [facility])    Aortic stenosis    CHF (congestive heart failure) ([place])    Complication of anesthesia    Hard to wake patient up after having anesthesia   Diabetes mellitus without complication ([place])    Diabetic neuropathy ([place])    Takes Lyrica   Edema of both legs    Takes Lasix   GERD (gastroesophageal reflux disease)    High cholesterol    HTN (hypertension)    Hypertension    PAD (peripheral artery disease) ([place])    Shortness of breath dyspnea    Spasm of back muscles    Stroke ([place])    Wound, open    Right great toe    Past Surgical History:  Procedure Laterality Date   AMPUTATION TOE Left 06/09/2019   Procedure: Left third toe and partial great toe amputation and debridement;  Surgeon: Katha Cabal, MD;  Location: [facility];  Service: Vascular;  Laterality: Left;   AMPUTATION TOE Left 04/06/2020   Procedure: AMPUTATION LEFT SECOND TOE;  Surgeon: Samara Deist, DPM;  Location: [facility];  Service: Podiatry;  Laterality: Left;   BYPASS GRAFT POPLITEAL TO TIBIAL Right 02/28/2016   Procedure: BYPASS GRAFT RIGHT BELOW KNEE POPLITEAL TO PERONEAL USING REVERSED RIGHT GREATER SAPHENOUS VEIN;  Surgeon: Conrad Ione, MD;  Location: [place];  Service: Vascular;  Laterality: Right;   CYST EXCISION     Abdomen   EYE SURGERY Bilateral    Cataract removal   INTRAMEDULLARY (IM) NAIL INTERTROCHANTERIC Left 02/04/2014   Procedure: INTRAMEDULLARY (IM) NAIL INTERTROCHANTRIC FEMORAL;  Surgeon: Mauri Pole, MD;  Location: [place];  Service: Orthopedics;  Laterality: Left;   IR GENERIC HISTORICAL  03/01/2016    IR ANGIO INTRA EXTRACRAN SEL COM CAROTID INNOMINATE UNI R MOD SED 03/01/2016 [name], MD [facility]   IR GENERIC HISTORICAL  03/01/2016   IR ENDOVASC INTRACRANIAL INF OTHER THAN THROMBO ART INC DIAG ANGIO 03/01/2016 [name], MD [facility]   IR GENERIC HISTORICAL  03/01/2016   IR INTRAVSC STENT CERV CAROTID W/O EMB-PROT MOD SED INC ANGIO 03/01/2016 [name], MD [facility]   IR GENERIC HISTORICAL  06/03/2016   IR RADIOLOGIST EVAL & MGMT 06/03/2016 [facility]   LOWER EXTREMITY ANGIOGRAPHY Left 02/09/2019   Procedure: LOWER EXTREMITY ANGIOGRAPHY;  Surgeon: Katha Cabal, MD;  Location: [place] CV LAB;  Service: Cardiovascular;  Laterality: Left;   LOWER EXTREMITY ANGIOGRAPHY Left 03/10/2019   Procedure: LOWER EXTREMITY ANGIOGRAPHY;  Surgeon: Katha Cabal, MD;  Location: [place] [facility] LAB;  Service: Cardiovascular;  Laterality: Left;   LOWER EXTREMITY ANGIOGRAPHY Left 04/27/2019   Procedure: LOWER EXTREMITY ANGIOGRAPHY;  Surgeon: Katha Cabal, MD;  Location: [place] [facility];  Service: Cardiovascular;  Laterality: Left;   LOWER EXTREMITY ANGIOGRAPHY Left 06/08/2019   Procedure: Lower Extremity Angiography;  Surgeon: Katha Cabal, MD;  Location: [place] [facility];  Service: Cardiovascular;  Laterality: Left;   LOWER EXTREMITY ANGIOGRAPHY Left 04/03/2020   Procedure: Lower Extremity Angiography;  Surgeon: Algernon Huxley, MD;  Location: [place] [facility];  Service: Cardiovascular;  Laterality: Left;   LOWER EXTREMITY ANGIOGRAPHY Right 10/20/2020   Procedure: Lower Extremity Angiography;  Surgeon: Katha Cabal, MD;  Location: [place] [facility];  Service: Cardiovascular;  Laterality: Right;   LOWER EXTREMITY ANGIOGRAPHY Left 01/23/2021   Procedure: LOWER EXTREMITY ANGIOGRAPHY;  Surgeon: Katha Cabal, MD;  Location: [place] [facility];  Service: Cardiovascular;  Laterality: Left;   LOWER EXTREMITY ANGIOGRAPHY Right  04/10/2021   Procedure: LOWER EXTREMITY ANGIOGRAPHY;  Surgeon: Katha Cabal, MD;  Location: [place] [facility];  Service: Cardiovascular;  Laterality: Right;   ORIF TOE FRACTURE Right 02/08/2014   Procedure: OPEN REDUCTION INTERNAL FIXATION Right METATARSAL  FRACTURE ;  Surgeon: Wylene Simmer, MD;  Location: [place];  Service: Orthopedics;  Laterality: Right;   PERIPHERAL VASCULAR CATHETERIZATION N/A 12/28/2015   Procedure: Abdominal Aortogram w/Lower Extremity;  Surgeon: Conrad Saguache, MD;  Location: [place] [facility] LAB;  Service: Cardiovascular;  Laterality: N/A;   RADIOLOGY WITH ANESTHESIA N/A 03/01/2016   Procedure: RADIOLOGY WITH ANESTHESIA;  Surgeon: [name], MD;  Location: [place];  Service: Radiology;  Laterality: N/A;   VEIN HARVEST Right 02/28/2016   Procedure: RIGHT GREATER SAPHENOUS VEIN HARVEST;  Surgeon: Conrad Pulaski, MD;  Location: [place];  Service: Vascular;  Laterality: Right;    There were no vitals filed for this visit.   Subjective Assessment - 05/21/21 1435     Subjective Patient reports no changes since last visit    Patient is accompained by: Family member   [name]   Pertinent History Pt returns to [facility] outpatient due to deconditioning for core strengthening and working toward improving transfers, general mobility in her wheelchair.  Pt previously had to discontinue PT d/t illness (per spouse, pt had seizure). Pt also with recent hx of COVID19. Pt is s/p successful recanalization of RLE for limb salvage as of 04/10/2021.  PMH includes acute HF, CHF, DM, neuropathy, GERD, HTN, PAD, stroke, COPD, ESBL, CVA, amputation of 2nd toe, pressure ulcer of sacral region stage II, amputation of 3rd toe, hypercapnic respiratory failure, peripheral neuropathy, gangrene of toe of both feet. Pt spouse provides information as pt poor historian. Pt has caregiver 5 days/week. Pt transfers with hoyer lift, with help of spouse and son. Pt has not stood in 3-4 years. Pt's spouse reports pt  currently has sores on her bottom, but that he is treating them and was instructed in how by nursing. Pt wears briefs that spouse changes every 2.5 hours. Pt uses WC (spouse pushes chair) with seatbelt to prevent sliding from chair.    Limitations Sitting;Walking;Lifting;Standing;House hold activities   pt has not stood in 3-4 yrs   How long can you sit comfortably? Pt spouse reports 2 hours. Must be careful d/t preventing and trying to heal wounds on bottom.    How long can you stand comfortably? unable    How long can you walk comfortably? Unable    Diagnostic tests 6/21 and 9/06 peripheral vascular catheterizatoin; 04/10/2021 Successful recanalization right lower extremity for limb salvage    Patient Stated Goals to strengthen pt's trunk to improve mobility in chair, assist with ADLs    Pain Onset More than a month ago                                          PT Short Term Goals - 05/11/21 1329  PT SHORT TERM GOAL #1   Title Patient will be independent in home exercise program to improve strength/mobility for better functional independence with ADLs.    Baseline 10/6: to be initiated    Time 6    Period Weeks    Status New    Target Date 06/21/21               PT Long Term Goals - 05/11/21 1330       PT LONG TERM GOAL #1   Title Patient will increase FOTO score to equal to or greater than 44 to demonstrate statistically significant improvement in mobility and quality of life.    Baseline 10/6: 26    Time 12    Period Weeks    Status New    Target Date 08/02/21      PT LONG TERM GOAL #2   Title Patient will roll L and R in bed with mod A for decreased caregiver burden for ADLs and toileting.    Baseline 10/6: per pt spouse, pt currently dependent    Time 12    Period Weeks    Status New    Target Date 08/02/21      PT LONG TERM GOAL #3   Title Patient will sit upright in WC, with BUE support and without leaning on backrest for 10  minutes for ADL performance and pressure relief.    Baseline 10/6: pt exhibits core contraction but unable to sit upright    Time 12    Period Weeks    Status New    Target Date 08/02/21      PT LONG TERM GOAL #4   Title Pt will demonstrate ability to utilize BUEs to weight-shift to each side in WC and maintain weight shift for 30 seconds to assist in pressure relief and reduece risk of wound formation.    Baseline 10/6: pt currently unable    Time 12    Period Weeks    Status New    Target Date 08/02/21                    Patient will benefit from skilled therapeutic intervention in order to improve the following deficits and impairments:     Visit Diagnosis: No diagnosis found.     Problem List Patient Active Problem List   Diagnosis Date Noted   Atherosclerosis of native arteries of extremity with rest pain ([place]) 04/20/2021   COVID-19 virus infection 03/07/2021   Acute febrile illness 03/07/2021   Hypoalbuminemia 03/07/2021   Hyperglycemia due to diabetes mellitus ([place]) 03/07/2021   Elevated troponin 03/07/2021   Constipation 03/07/2021   COPD (chronic obstructive pulmonary disease) ([place]) 10/20/2020   GERD (gastroesophageal reflux disease) 10/20/2020   Physical deconditioning 10/20/2020   Cellulitis 10/19/2020   Necrotic toes ([facility])    Insulin dependent diabetes mellitus type IA ([place])    Diabetic neuropathy ([place])    Gangrene of toe of both feet ([place]) 06/06/2019   Peripheral arterial disease ([place]) 04/27/2019   ESBL (extended spectrum beta-lactamase) producing bacteria infection 12/25/2018   PVD (peripheral vascular disease) ([place]) 09/12/2016   Ulcer of right lower extremity ([place]) 08/29/2016   Acute on chronic diastolic heart failure ([place]) 07/08/2016   HTN (hypertension) 07/05/2016   Sensorineural hearing loss (SNHL), 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   Chronic diastolic CHF (congestive heart failure) ([place]) 03/10/2016   HLD (hyperlipidemia) 03/09/2016   Acute  respiratory acidosis ([place]) 03/05/2016   Anemia, iron deficiency 03/03/2016  Cerebrovascular accident (CVA) due to thrombosis of precerebral artery ([place]) 03/01/2016   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 ([place]) 03/01/2016   Atherosclerosis of native arteries of the extremities with gangrene ([place]) 12/08/2015   Anemia, chronic disease 04/27/2014   Unspecified hereditary and idiopathic peripheral neuropathy 03/14/2014   DM2 (diabetes mellitus, type 2) ([place]) 02/14/2014   Aortic stenosis 02/04/2014   Closed left subtrochanteric femur fracture ([place]) 02/03/2014   Hip fracture, left ([place]) 02/03/2014   Fibula fracture 02/03/2014   MTP instability 02/03/2014   Hip fracture ([place]) 02/03/2014    Lewis Moccasin, PT 05/21/2021, 2:39 PM  [place] MAIN [facility] SERVICES [address] [place], [place], [address] Phone: [phone]   Fax:  [phone]  Name: KAMRYNN MELOTT MRN: [id number] Date of Birth: [birth year]

## 2021-05-21 NOTE — Therapy (Signed)
[place] MAIN [facility] SERVICES [address] [place], [place], [address] Phone: [phone]   Fax:  [phone]  Physical Therapy Treatment  Patient Details  Name: [name] MRN: [id number] Date of Birth: [birth year] Referring Provider (PT): Sharin Grave   Encounter Date: 05/21/2021   PT End of Session - 05/21/21 1133     Visit Number 4    Number of Visits 25    Date for PT Re-Evaluation 08/02/21    PT Start Time 9937    PT Stop Time 1696    PT Time Calculation (min) 43 min    Equipment Utilized During Treatment Gait belt    Activity Tolerance Patient tolerated treatment well    Behavior During Therapy Flat affect             Past Medical History:  Diagnosis Date   Acute heart failure ([place])    Aortic stenosis    CHF (congestive heart failure) ([place])    Complication of anesthesia    Hard to wake patient up after having anesthesia   Diabetes mellitus without complication ([place])    Diabetic neuropathy ([place])    Takes Lyrica   Edema of both legs    Takes Lasix   GERD (gastroesophageal reflux disease)    High cholesterol    HTN (hypertension)    Hypertension    PAD (peripheral artery disease) ([place])    Shortness of breath dyspnea    Spasm of back muscles    Stroke ([place])    Wound, open    Right great toe    Past Surgical History:  Procedure Laterality Date   AMPUTATION TOE Left 06/09/2019   Procedure: Left third toe and partial great toe amputation and debridement;  Surgeon: Katha Cabal, MD;  Location: [facility];  Service: Vascular;  Laterality: Left;   AMPUTATION TOE Left 04/06/2020   Procedure: AMPUTATION LEFT SECOND TOE;  Surgeon: Samara Deist, DPM;  Location: [facility];  Service: Podiatry;  Laterality: Left;   BYPASS GRAFT POPLITEAL TO TIBIAL Right 02/28/2016   Procedure: BYPASS GRAFT RIGHT BELOW KNEE POPLITEAL TO PERONEAL USING REVERSED RIGHT GREATER SAPHENOUS VEIN;  Surgeon: Conrad Brewster, MD;  Location: [place];   Service: Vascular;  Laterality: Right;   CYST EXCISION     Abdomen   EYE SURGERY Bilateral    Cataract removal   INTRAMEDULLARY (IM) NAIL INTERTROCHANTERIC Left 02/04/2014   Procedure: INTRAMEDULLARY (IM) NAIL INTERTROCHANTRIC FEMORAL;  Surgeon: Mauri Pole, MD;  Location: [place];  Service: Orthopedics;  Laterality: Left;   IR GENERIC HISTORICAL  03/01/2016   IR ANGIO INTRA EXTRACRAN SEL COM CAROTID INNOMINATE UNI R MOD SED 03/01/2016 [name], MD [facility]   IR GENERIC HISTORICAL  03/01/2016   IR ENDOVASC INTRACRANIAL INF OTHER THAN THROMBO ART INC DIAG ANGIO 03/01/2016 [name], MD [facility]   IR GENERIC HISTORICAL  03/01/2016   IR INTRAVSC STENT CERV CAROTID W/O EMB-PROT MOD SED INC ANGIO 03/01/2016 [name], MD [facility]   IR GENERIC HISTORICAL  06/03/2016   IR RADIOLOGIST EVAL & MGMT 06/03/2016 [facility]   LOWER EXTREMITY ANGIOGRAPHY Left 02/09/2019   Procedure: LOWER EXTREMITY ANGIOGRAPHY;  Surgeon: Katha Cabal, MD;  Location: [place] [facility];  Service: Cardiovascular;  Laterality: Left;   LOWER EXTREMITY ANGIOGRAPHY Left 03/10/2019   Procedure: LOWER EXTREMITY ANGIOGRAPHY;  Surgeon: Katha Cabal, MD;  Location: [place] [facility];  Service: Cardiovascular;  Laterality: Left;  LOWER EXTREMITY ANGIOGRAPHY Left 04/27/2019   Procedure: LOWER EXTREMITY ANGIOGRAPHY;  Surgeon: Katha Cabal, MD;  Location: [place] [facility];  Service: Cardiovascular;  Laterality: Left;   LOWER EXTREMITY ANGIOGRAPHY Left 06/08/2019   Procedure: Lower Extremity Angiography;  Surgeon: Katha Cabal, MD;  Location: [place] [facility];  Service: Cardiovascular;  Laterality: Left;   LOWER EXTREMITY ANGIOGRAPHY Left 04/03/2020   Procedure: Lower Extremity Angiography;  Surgeon: Algernon Huxley, MD;  Location: [place] [facility];  Service: Cardiovascular;  Laterality: Left;   LOWER EXTREMITY ANGIOGRAPHY Right 10/20/2020   Procedure: Lower Extremity  Angiography;  Surgeon: Katha Cabal, MD;  Location: [place] [facility];  Service: Cardiovascular;  Laterality: Right;   LOWER EXTREMITY ANGIOGRAPHY Left 01/23/2021   Procedure: LOWER EXTREMITY ANGIOGRAPHY;  Surgeon: Katha Cabal, MD;  Location: [place] [facility];  Service: Cardiovascular;  Laterality: Left;   LOWER EXTREMITY ANGIOGRAPHY Right 04/10/2021   Procedure: LOWER EXTREMITY ANGIOGRAPHY;  Surgeon: Katha Cabal, MD;  Location: [place] [facility];  Service: Cardiovascular;  Laterality: Right;   ORIF TOE FRACTURE Right 02/08/2014   Procedure: OPEN REDUCTION INTERNAL FIXATION Right METATARSAL  FRACTURE ;  Surgeon: Wylene Simmer, MD;  Location: [place];  Service: Orthopedics;  Laterality: Right;   PERIPHERAL VASCULAR CATHETERIZATION N/A 12/28/2015   Procedure: Abdominal Aortogram w/Lower Extremity;  Surgeon: Conrad [place], MD;  Location: [place] [facility];  Service: Cardiovascular;  Laterality: N/A;   RADIOLOGY WITH ANESTHESIA N/A 03/01/2016   Procedure: RADIOLOGY WITH ANESTHESIA;  Surgeon: [name], MD;  Location: [place];  Service: Radiology;  Laterality: N/A;   VEIN HARVEST Right 02/28/2016   Procedure: RIGHT GREATER SAPHENOUS VEIN HARVEST;  Surgeon: Conrad [place], MD;  Location: [place];  Service: Vascular;  Laterality: Right;    There were no vitals filed for this visit.   Subjective Assessment - 05/21/21 1435     Subjective Patient reports no changes since last visit. Husband reports that she has been doing okay- No worsening of sacral region.    Patient is accompained by: Family member   william   Pertinent History Pt returns to [facility] outpatient due to deconditioning for core strengthening and working toward improving transfers, general mobility in her wheelchair.  Pt previously had to discontinue PT d/t illness (per spouse, pt had seizure). Pt also with recent hx of COVID19. Pt is s/p successful recanalization of RLE for limb salvage as of 04/10/2021.  PMH includes acute  HF, CHF, DM, neuropathy, GERD, HTN, PAD, stroke, COPD, ESBL, CVA, amputation of 2nd toe, pressure ulcer of sacral region stage II, amputation of 3rd toe, hypercapnic respiratory failure, peripheral neuropathy, gangrene of toe of both feet. Pt spouse provides information as pt poor historian. Pt has caregiver 5 days/week. Pt transfers with hoyer lift, with help of spouse and son. Pt has not stood in 3-4 years. Pt's spouse reports pt currently has sores on her bottom, but that he is treating them and was instructed in how by nursing. Pt wears briefs that spouse changes every 2.5 hours. Pt uses WC (spouse pushes chair) with seatbelt to prevent sliding from chair.    Limitations Sitting;Walking;Lifting;Standing;House hold activities   pt has not stood in 3-4 yrs   How long can you sit comfortably? Pt spouse reports 2 hours. Must be careful d/t preventing and trying to heal wounds on bottom.    How long can you stand comfortably? unable    How long can you walk  comfortably? Unable    Diagnostic tests 6/21 and 9/06 peripheral vascular catheterizatoin; 04/10/2021 Successful recanalization right lower extremity for limb salvage    Patient Stated Goals to strengthen pt's trunk to improve mobility in chair, assist with ADLs    Currently in Pain? No/denies    Pain Onset More than a month ago            INTERVENTIONS:   Therapeutic Exercises:   PT reviewed with patient and her husband about pressure-relief technique in wheelchair including lateral weight shifts (pushing through BUEs off arm rests and attempting to off-load one LE) 10-15 times each direction.  PT provides demo in technique, verbal cues and tactile cues.  Patient with difficulty following VC/Visual cues today- unable to lean over without some assistance and will benefit from continued review.  Seated in w/c  UE/LE strengthening exercises:   Dynamic leaning to right and then left side x 10 reps each direction to off load weight. Repetitive VC  for technique  Seated active assistive  BLE hip flexion marches 2 x 10 bilateral lower extremities.  Patient able to achieve partial ROM and VC to slow down for best muscle control. She reported being tired but able to complete today.   Seated hip add with ball squeeze - 5 sec hold with VC to hold contraction.    Patient performs 2 x 10 LAQ's.  Patient able to achieve  to perform actively without assistance after several repetitions.  Manually resisted hip abduction 3 x 10 with 2 to 3-second holds.   Seated bicep curls with 2 pound weighted bar 12 reps  Seated crunches 12 times with 1 to 3-second holds and frequent use of tactile cueing for technique.  Manually resisted lateral trunk flexion for improved core strengthening 5 times each side with 3-second holds.          Issued below HEP to Husband today:   Access Code: [email or web address] URL: [email or web address] Date: 05/21/2021 Prepared by: Sande Brothers, PT  Exercises Seated March - 1 x daily - 3 x weekly - 3 sets - 10 reps - 2 hold Seated Hip Adduction Squeeze with Ball - 1 x daily - 3 x weekly - 3 sets - 10 reps - 2 hold Seated Long Arc Quad - 1 x daily - 3 x weekly - 3 sets - 10 reps - 2 hold Seated Bicep Curls Supinated with Dumbbells - 1 x daily - 3 x weekly - 3 sets - 10 reps - 2 hold     Clinical Impression: Patient presents with good motivation initially at treatment yet more fair requesting rest break and asking how much longer towards the later portion of the session. She was able to follow VC (repetitive) with some activities but able to complete with some encouragement.  She was re-instructed in pressure relieving strategies and encouraged patient to participate fully to achieve full potential. Husband is encouraging and able to follow prompting and education regarding HEP well today. Patient will benefit from further skilled PT to increase strength, range of motion, mobility, and seated balance to  increase ease and safety with ADLs                  PT Education - 05/21/21 1439     Education Details Exercise Technique    Person(s) Educated Patient;Spouse    Methods Explanation;Demonstration;Tactile cues;Verbal cues    Comprehension Tactile cues required;Verbalized understanding;Returned demonstration;Need further instruction;Verbal cues required  PT Short Term Goals - 05/11/21 1329       PT SHORT TERM GOAL #1   Title Patient will be independent in home exercise program to improve strength/mobility for better functional independence with ADLs.    Baseline 10/6: to be initiated    Time 6    Period Weeks    Status New    Target Date 06/21/21               PT Long Term Goals - 05/11/21 1330       PT LONG TERM GOAL #1   Title Patient will increase FOTO score to equal to or greater than 44 to demonstrate statistically significant improvement in mobility and quality of life.    Baseline 10/6: 26    Time 12    Period Weeks    Status New    Target Date 08/02/21      PT LONG TERM GOAL #2   Title Patient will roll L and R in bed with mod A for decreased caregiver burden for ADLs and toileting.    Baseline 10/6: per pt spouse, pt currently dependent    Time 12    Period Weeks    Status New    Target Date 08/02/21      PT LONG TERM GOAL #3   Title Patient will sit upright in WC, with BUE support and without leaning on backrest for 10 minutes for ADL performance and pressure relief.    Baseline 10/6: pt exhibits core contraction but unable to sit upright    Time 12    Period Weeks    Status New    Target Date 08/02/21      PT LONG TERM GOAL #4   Title Pt will demonstrate ability to utilize BUEs to weight-shift to each side in WC and maintain weight shift for 30 seconds to assist in pressure relief and reduece risk of wound formation.    Baseline 10/6: pt currently unable    Time 12    Period Weeks    Status New    Target Date  08/02/21                   Plan - 05/21/21 1134     Clinical Impression Statement Patient presents with good motivation initially at treatment yet more fair requesting rest break and asking how much longer towards the later portion of the session. She was able to follow VC (repetitive) with some activities but able to complete with some encouragement.  She was re-instructed in pressure relieving strategies and encouraged patient to participate fully to achieve full potential. Husband is encouraging and able to follow prompting and education regarding HEP well today. Patient will benefit from further skilled PT to increase strength, range of motion, mobility, and seated balance to increase ease and safety with ADLs    Personal Factors and Comorbidities Age;Time since onset of injury/illness/exacerbation;Comorbidity 3+;Past/Current Experience;Education;Social Background;Transportation;Sex;Fitness    Comorbidities diastolic congestive heart failure, diabetes mellitus, diabetic neuropathy, hypertension, hyperlipidemia, GERD, peripheral vascular disease, CVA. on 9/2 had amputation of L foot second toe MTP joint and LLE revascularization, recent hx of COVID19    Examination-Activity Limitations Bathing;Hygiene/Grooming;Squat;Bed Mobility;Stairs;Lift;Bend;Locomotion Level;Stand;Caring for [employer];Toileting;Carry;Self Feeding;Transfers;Continence;Dressing;Other;Sit    Examination-Participation Restrictions Cleaning;Shop;Community Activity;Meal Prep;Driving;Other    Stability/Clinical Decision Making Evolving/Moderate complexity    Rehab Potential Poor    PT Frequency 2x / week    PT Duration 12 weeks    PT Treatment/Interventions ADLs/Self Care Home Management;DME  Instruction;Functional mobility training;Therapeutic activities;Therapeutic exercise;Neuromuscular re-education;Patient/family education;Wheelchair mobility training;Passive range of motion;Energy  conservation;Cryotherapy;Biofeedback;Electrical Stimulation;Iontophoresis 4mg /ml Dexamethasone;Moist Heat;Traction;Ultrasound;Stair training;Cognitive remediation;Balance training;Orthotic Fit/Training;Manual techniques;Manual lymph drainage;Splinting;Taping;Vasopneumatic Device;Vestibular;Visual/perceptual remediation/compensation;Gait training    PT Next Visit Plan bed mobility, pressure relief techniques, UE/LE strengthening and ROM, seated balance    PT Home Exercise Plan Previous- Seated lateral weight shift pressure relief technique in wheelchair to be performed 5 days/week for 2 sets of 10 repetitions each side.  PT provided patient with picture of technique as well.  05/21/2021- Access Code: [email or web address]    Consulted and Agree with Plan of Care Patient;Family member/caregiver    Family Member Consulted Spouse             Patient will benefit from skilled therapeutic intervention in order to improve the following deficits and impairments:  Decreased cognition, Decreased skin integrity, Impaired sensation, Decreased mobility, Postural dysfunction, Decreased activity tolerance, Decreased endurance, Decreased range of motion, Decreased strength, Decreased balance, Decreased safety awareness, Increased edema, Impaired flexibility, Decreased coordination, Cardiopulmonary status limiting activity, Abnormal gait, Impaired UE functional use, Impaired tone, Improper body mechanics, Pain, Difficulty walking, Decreased knowledge of use of DME, Hypomobility, Impaired perceived functional ability  Visit Diagnosis: Difficulty in walking, not elsewhere classified  Unsteadiness on feet     Problem List Patient Active Problem List   Diagnosis Date Noted   Atherosclerosis of native arteries of extremity with rest pain ([place]) 04/20/2021   COVID-19 virus infection 03/07/2021   Acute febrile illness 03/07/2021   Hypoalbuminemia 03/07/2021   Hyperglycemia due to diabetes mellitus ([place]) 03/07/2021    Elevated troponin 03/07/2021   Constipation 03/07/2021   COPD (chronic obstructive pulmonary disease) ([place]) 10/20/2020   GERD (gastroesophageal reflux disease) 10/20/2020   Physical deconditioning 10/20/2020   Cellulitis 10/19/2020   Necrotic toes (HCC)    Insulin dependent diabetes mellitus type IA ([place])    Diabetic neuropathy (H[facility])    Gangrene of toe of both feet ([place]) 06/06/2019   Peripheral arterial disease ([place]) 04/27/2019   ESBL (extended spectrum beta-lactamase) producing bacteria infection 12/25/2018   PVD (peripheral vascular disease) ([place]) 09/12/2016   Ulcer of right lower extremity ([place]) 08/29/2016   Acute on chronic diastolic heart failure ([place]) 07/08/2016   HTN (hypertension) 07/05/2016   Sensorineural hearing loss (SNHL), 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   Chronic diastolic CHF (congestive heart failure) ([place]) 03/10/2016   HLD (hyperlipidemia) 03/09/2016   Acute respiratory acidosis (HCC) 03/05/2016   Anemia, iron deficiency 03/03/2016   Cerebrovascular accident (CVA) due to thrombosis of precerebral artery ([place]) 03/01/2016   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 ([place]) 03/01/2016   Atherosclerosis of native arteries of the extremities with gangrene ([place]) 12/08/2015   Anemia, chronic disease 04/27/2014   Unspecified hereditary and idiopathic peripheral neuropathy 03/14/2014   DM2 (diabetes mellitus, type 2) ([place]) 02/14/2014   Aortic stenosis 02/04/2014   Closed left subtrochanteric femur fracture ([place]) 02/03/2014   Hip fracture, left ([place]) 02/03/2014   Fibula fracture 02/03/2014   MTP instability 02/03/2014   Hip fracture ([place]) 02/03/2014    Lewis Moccasin, PT 05/22/2021, 11:58 AM  [facility] [address] [place], [place], [address] Phone: [phone]   Fax:  [phone]  Name: [name] MRN: [id number] Date of Birth: 07/09/1941

## 2021-05-23 ENCOUNTER — Ambulatory Visit: Payer: Medicare HMO

## 2021-05-23 ENCOUNTER — Other Ambulatory Visit: Payer: Self-pay

## 2021-05-23 DIAGNOSIS — M6281 Muscle weakness (generalized): Secondary | ICD-10-CM | POA: Diagnosis not present

## 2021-05-23 DIAGNOSIS — R293 Abnormal posture: Secondary | ICD-10-CM

## 2021-05-23 DIAGNOSIS — R278 Other lack of coordination: Secondary | ICD-10-CM

## 2021-05-23 DIAGNOSIS — R2689 Other abnormalities of gait and mobility: Secondary | ICD-10-CM

## 2021-05-23 NOTE — Therapy (Signed)
[place] MAIN [facility] SERVICES [address] [place], [place], [address] Phone: [phone]   Fax:  [phone]  Physical Therapy Treatment  Patient Details  Name: [name] MRN: [id number] Date of Birth: [birth year] Referring Provider (PT): Sharin Grave   Encounter Date: 05/23/2021   PT End of Session - 05/23/21 1642     Visit Number 5    Number of Visits 25    Date for PT Re-Evaluation 08/02/21    Authorization Type [employer]; Medicaid    Authorization Time Period 05/10/21-08/02/21    Progress Note Due on Visit 10    PT Start Time 1346    PT Stop Time 1426    PT Time Calculation (min) 40 min    Equipment Utilized During Treatment Gait belt    Activity Tolerance Patient tolerated treatment well;No increased pain    Behavior During Therapy Flat affect;Impulsive             Past Medical History:  Diagnosis Date   Acute heart failure ([place])    Aortic stenosis    CHF (congestive heart failure) (HCC)    Complication of anesthesia    Hard to wake patient up after having anesthesia   Diabetes mellitus without complication ([place])    Diabetic neuropathy ([place])    Takes Lyrica   Edema of both legs    Takes Lasix   GERD (gastroesophageal reflux disease)    High cholesterol    HTN (hypertension)    Hypertension    PAD (peripheral artery disease) (HCC)    Shortness of breath dyspnea    Spasm of back muscles    Stroke (HCC)    Wound, open    Right great toe    Past Surgical History:  Procedure Laterality Date   AMPUTATION TOE Left 06/09/2019   Procedure: Left third toe and partial great toe amputation and debridement;  Surgeon: Katha Cabal, MD;  Location: [facility];  Service: Vascular;  Laterality: Left;   AMPUTATION TOE Left 04/06/2020   Procedure: AMPUTATION LEFT SECOND TOE;  Surgeon: Samara Deist, DPM;  Location: [facility];  Service: Podiatry;  Laterality: Left;   BYPASS GRAFT POPLITEAL TO TIBIAL Right 02/28/2016    Procedure: BYPASS GRAFT RIGHT BELOW KNEE POPLITEAL TO PERONEAL USING REVERSED RIGHT GREATER SAPHENOUS VEIN;  Surgeon: Conrad Aust[place]own, MD;  Location: [place];  Service: Vascular;  Laterality: Right;   CYST EXCISION     Abdomen   EYE SURGERY Bilateral    Cataract removal   INTRAMEDULLARY (IM) NAIL INTERTROCHANTERIC Left 02/04/2014   Procedure: INTRAMEDULLARY (IM) NAIL INTERTROCHANTRIC FEMORAL;  Surgeon: Mauri Pole, MD;  Location: [place];  Service: Orthopedics;  Laterality: Left;   IR GENERIC HISTORICAL  03/01/2016   IR ANGIO INTRA EXTRACRAN SEL COM CAROTID INNOMINATE UNI R MOD SED 03/01/2016 [name], MD [facility]   IR GENERIC HISTORICAL  03/01/2016   IR ENDOVASC INTRACRANIAL INF OTHER THAN THROMBO ART INC DIAG ANGIO 03/01/2016 [name], MD [facility]   IR GENERIC HISTORICAL  03/01/2016   IR INTRAVSC STENT CERV CAROTID W/O EMB-PROT MOD SED INC ANGIO 03/01/2016 [name], MD [facility]   IR GENERIC HISTORICAL  06/03/2016   IR RADIOLOGIST EVAL & MGMT 06/03/2016 [facility]   LOWER EXTREMITY ANGIOGRAPHY Left 02/09/2019   Procedure: LOWER EXTREMITY ANGIOGRAPHY;  Surgeon: Katha Cabal, MD;  Location: [place] [facility];  Service: Cardiovascular;  Laterality: Left;   LOWER EXTREMITY ANGIOGRAPHY Left 03/10/2019  Procedure: LOWER EXTREMITY ANGIOGRAPHY;  Surgeon: Katha Cabal, MD;  Location: [place] [facility];  Service: Cardiovascular;  Laterality: Left;   LOWER EXTREMITY ANGIOGRAPHY Left 04/27/2019   Procedure: LOWER EXTREMITY ANGIOGRAPHY;  Surgeon: Katha Cabal, MD;  Location: [place] [facility];  Service: Cardiovascular;  Laterality: Left;   LOWER EXTREMITY ANGIOGRAPHY Left 06/08/2019   Procedure: Lower Extremity Angiography;  Surgeon: Katha Cabal, MD;  Location: [place] [facility];  Service: Cardiovascular;  Laterality: Left;   LOWER EXTREMITY ANGIOGRAPHY Left 04/03/2020   Procedure: Lower Extremity Angiography;  Surgeon: Algernon Huxley, MD;   Location: [place] [facility];  Service: Cardiovascular;  Laterality: Left;   LOWER EXTREMITY ANGIOGRAPHY Right 10/20/2020   Procedure: Lower Extremity Angiography;  Surgeon: Katha Cabal, MD;  Location: [place] [facility];  Service: Cardiovascular;  Laterality: Right;   LOWER EXTREMITY ANGIOGRAPHY Left 01/23/2021   Procedure: LOWER EXTREMITY ANGIOGRAPHY;  Surgeon: Katha Cabal, MD;  Location: [place] [facility];  Service: Cardiovascular;  Laterality: Left;   LOWER EXTREMITY ANGIOGRAPHY Right 04/10/2021   Procedure: LOWER EXTREMITY ANGIOGRAPHY;  Surgeon: Katha Cabal, MD;  Location: [place] [facility];  Service: Cardiovascular;  Laterality: Right;   ORIF TOE FRACTURE Right 02/08/2014   Procedure: OPEN REDUCTION INTERNAL FIXATION Right METATARSAL  FRACTURE ;  Surgeon: Wylene Simmer, MD;  Location: [place];  Service: Orthopedics;  Laterality: Right;   PERIPHERAL VASCULAR CATHETERIZATION N/A 12/28/2015   Procedure: Abdominal Aortogram w/Lower Extremity;  Surgeon: Conrad Zeigler, MD;  Location: [place] [facility];  Service: Cardiovascular;  Laterality: N/A;   RADIOLOGY WITH ANESTHESIA N/A 03/01/2016   Procedure: RADIOLOGY WITH ANESTHESIA;  Surgeon: [name], MD;  Location: [place];  Service: Radiology;  Laterality: N/A;   VEIN HARVEST Right 02/28/2016   Procedure: RIGHT GREATER SAPHENOUS VEIN HARVEST;  Surgeon: Conrad Loyall, MD;  Location: [place];  Service: Vascular;  Laterality: Right;    There were no vitals filed for this visit.   Subjective Assessment - 05/23/21 1614     Subjective Husband reports no updates since last session. They did not have a chance to try out any of the HEP activities.    Patient is accompained by: Family member    Pertinent History 18yoF presenting to [facility] to address recent gradual progressive decline in mobility, family goals for improved strength, to promote > participation in transfers to wheelchair and safety with ADL performance. Recent illness  includes admission to [facility] on 03/07/21-03/10/21 due to COVID infection c AMS and fever. Pt then underwent outpatient recanalization/limb salvage of RLE 04/10/2021; LLE salvage performed in June 2022. Other PMH: CHF, DM, HTN, CVA, COPD, multiple remote toe amputations, stg 2 sacral ulcer. PLOF includes 3-4 years bed bound status, hoyerlifts to WC, totalA for ADL performance. Pt is cared for by family, as well as professional caregiver services.    Currently in Pain? --   pt has pain in both feet at rest; pt has pain in both knees with flexion.              INTERVENTION THIS DATE: *interventions performed in WC this date -Seated AA/ROM bilat knee flexion extension 2x10bilat -ballsqueeze 1x10 at knees, 1x10 at feet -yellowTB clam 1x10   -TotalA repositioning from Left lateral slump to centralized trunk -TotalA slide toward head in WC from sacral sitting to hips back in chair  -trunk flexion from reclined to upright 2x8 with maxA to obtain partial movement; pt severely  limited, using BUE on armrests to pull self forward (<20 degrees of trunk flexion achieved)  -single UE forward reach and lean 1x10 bilat (maxA facilitation at scapula)  -adjustment of lap belt on WC frame to a more stnadard/appropriate position -education on avoiding pressur  injury from ELR on [facility]      PT Education - 05/23/21 1641     Education Details padding of foot rests to prevent skin injury 2/2 ankle contractures    Person(s) Educated Spouse    Methods Explanation;Demonstration    Comprehension Verbalized understanding              PT Short Term Goals - 05/11/21 1329       PT SHORT TERM GOAL #1   Title Patient will be independent in home exercise program to improve strength/mobility for better functional independence with ADLs.    Baseline 10/6: to be initiated    Time 6    Period Weeks    Status New    Target Date 06/21/21               PT Long Term Goals - 05/11/21 1330       PT LONG TERM  GOAL #1   Title Patient will increase FOTO score to equal to or greater than 44 to demonstrate statistically significant improvement in mobility and quality of life.    Baseline 10/6: 26    Time 12    Period Weeks    Status New    Target Date 08/02/21      PT LONG TERM GOAL #2   Title Patient will roll L and R in bed with mod A for decreased caregiver burden for ADLs and toileting.    Baseline 10/6: per pt spouse, pt currently dependent    Time 12    Period Weeks    Status New    Target Date 08/02/21      PT LONG TERM GOAL #3   Title Patient will sit upright in WC, with BUE support and without leaning on backrest for 10 minutes for ADL performance and pressure relief.    Baseline 10/6: pt exhibits core contraction but unable to sit upright    Time 12    Period Weeks    Status New    Target Date 08/02/21      PT LONG TERM GOAL #4   Title Pt will demonstrate ability to utilize BUEs to weight-shift to each side in WC and maintain weight shift for 30 seconds to assist in pressure relief and reduece risk of wound formation.    Baseline 10/6: pt currently unable    Time 12    Period Weeks    Status New    Target Date 08/02/21                   Plan - 05/23/21 1645     Clinical Impression Statement Continued with gentle ROM and gross motor actiivation. Pt continues to demonstrate limited insight into session and requires extensive cues for participation in exercises. Knee contractures in ankles and knees limit tolerance to certain types of postioning. Author relocated lab belt to lower positiong to allow for greater control of trunk in siting. Husband educated on protecting feet better on ELR either with towels or footwear. No additional updates at this time.    Personal Factors and Comorbidities Age;Time since onset of injury/illness/exacerbation;Comorbidity 3+;Past/Current Experience;Education;Social Background;Transportation;Sex;Fitness    Comorbidities diastolic congestive  heart failure, diabetes mellitus, diabetic neuropathy, hypertension,  hyperlipidemia, GERD, peripheral vascular disease, CVA. on 9/2 had amputation of L foot second toe MTP joint and LLE revascularization, recent hx of COVID19    Examination-Activity Limitations Bathing;Hygiene/Grooming;Squat;Bed Mobility;Stairs;Lift;Bend;Locomotion Level;Stand;Caring for [employer];Toileting;Carry;Self Feeding;Transfers;Continence;Dressing;Other;Sit    Examination-Participation Restrictions Cleaning;Shop;Community Activity;Meal Prep;Driving;Other    Stability/Clinical Decision Making Evolving/Moderate complexity    Clinical Decision Making High    Rehab Potential Poor    PT Frequency 2x / week    PT Duration 12 weeks    PT Treatment/Interventions ADLs/Self Care Home Management;DME Instruction;Functional mobility training;Therapeutic activities;Therapeutic exercise;Neuromuscular re-education;Patient/family education;Wheelchair mobility training;Passive range of motion;Energy conservation;Cryotherapy;Biofeedback;Electrical Stimulation;Iontophoresis 4mg /ml Dexamethasone;Moist Heat;Traction;Ultrasound;Stair training;Cognitive remediation;Balance training;Orthotic Fit/Training;Manual techniques;Manual lymph drainage;Splinting;Taping;Vasopneumatic Device;Vestibular;Visual/perceptual remediation/compensation;Gait training    PT Next Visit Plan bed mobility, pressure relief techniques;    PT Home Exercise Plan Previous- Seated lateral weight shift pressure relief technique in wheelchair to be performed 5 days/week for 2 sets of 10 repetitions each side.  PT provided patient with picture of technique as well.  05/21/2021- Access Code: [email or web address]    Consulted and Agree with Plan of Care Family member/caregiver    Family Member Consulted Spouse             Patient will benefit from skilled therapeutic intervention in order to improve the following deficits and impairments:  Decreased cognition, Decreased skin  integrity, Impaired sensation, Decreased mobility, Postural dysfunction, Decreased activity tolerance, Decreased endurance, Decreased range of motion, Decreased strength, Decreased balance, Decreased safety awareness, Increased edema, Impaired flexibility, Decreased coordination, Cardiopulmonary status limiting activity, Abnormal gait, Impaired UE functional use, Impaired tone, Improper body mechanics, Pain, Difficulty walking, Decreased knowledge of use of DME, Hypomobility, Impaired perceived functional ability  Visit Diagnosis: Other abnormalities of gait and mobility  Abnormal posture  Other lack of coordination     Problem List Patient Active Problem List   Diagnosis Date Noted   Atherosclerosis of native arteries of extremity with rest pain ([place]) 04/20/2021   COVID-19 virus infection 03/07/2021   Acute febrile illness 03/07/2021   Hypoalbuminemia 03/07/2021   Hyperglycemia due to diabetes mellitus ([place]) 03/07/2021   Elevated troponin 03/07/2021   Constipation 03/07/2021   COPD (chronic obstructive pulmonary disease) ([place]) 10/20/2020   GERD (gastroesophageal reflux disease) 10/20/2020   Physical deconditioning 10/20/2020   Cellulitis 10/19/2020   Necrotic toes (HCC)    Insulin dependent diabetes mellitus type IA ([place])    Diabetic neuropathy (HCC)    Gangrene of toe of both feet ([place]) 06/06/2019   Peripheral arterial disease ([place]) 04/27/2019   ESBL (extended spectrum beta-lactamase) producing bacteria infection 12/25/2018   PVD (peripheral vascular disease) ([place]) 09/12/2016   Ulcer of right lower extremity ([place]) 08/29/2016   Acute on chronic diastolic heart failure ([place]) 07/08/2016   HTN (hypertension) 07/05/2016   Sensorineural hearing loss (SNHL), 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   Chronic diastolic CHF (congestive heart failure) ([place]) 03/10/2016   HLD (hyperlipidemia) 03/09/2016   Acute respiratory acidosis (HCC) 03/05/2016   Anemia, iron deficiency 03/03/2016   Cerebrovascular  accident (CVA) due to thrombosis of precerebral artery ([place]) 03/01/2016   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 ([place]) 03/01/2016   Atherosclerosis of native arteries of the extremities with gangrene ([place]) 12/08/2015   Anemia, chronic disease 04/27/2014   Unspecified hereditary and idiopathic peripheral neuropathy 03/14/2014   DM2 (diabetes mellitus, type 2) ([place]) 02/14/2014   Aortic stenosis 02/04/2014   Closed left subtrochanteric femur fracture ([place]) 02/03/2014   Hip fracture, left ([place]) 02/03/2014   Fibula fracture 02/03/2014   MTP instability 02/03/2014  Hip fracture ([place]) 02/03/2014   4:55 PM, 05/23/21 Etta Grandchild, PT, DPT Physical Therapist - [facility]  Outpatient Physical Therapy- [place] [phone]     [place], [place] 05/23/2021, 4:50 PM  [place] MAIN [facility] SERVICES [address] [place], [place], [address] Phone: [phone]   Fax:  [phone]  Name: NICOLAS SISLER MRN: [id number] Date of Birth: 1941/01/31

## 2021-05-28 ENCOUNTER — Ambulatory Visit: Payer: Medicare HMO

## 2021-05-28 ENCOUNTER — Other Ambulatory Visit: Payer: Self-pay

## 2021-05-28 DIAGNOSIS — M6281 Muscle weakness (generalized): Secondary | ICD-10-CM | POA: Diagnosis not present

## 2021-05-28 DIAGNOSIS — R278 Other lack of coordination: Secondary | ICD-10-CM

## 2021-05-28 DIAGNOSIS — R2681 Unsteadiness on feet: Secondary | ICD-10-CM

## 2021-05-28 DIAGNOSIS — R293 Abnormal posture: Secondary | ICD-10-CM

## 2021-05-28 DIAGNOSIS — R2689 Other abnormalities of gait and mobility: Secondary | ICD-10-CM

## 2021-05-28 NOTE — Therapy (Signed)
[place] MAIN [facility] SERVICES [address] [place], [place], [address] Phone: [phone]   Fax:  [phone]  Physical Therapy Treatment  Patient Details  Name: [name] MRN: [id number] Date of Birth: 1940-11-04 Referring Provider (PT): Sharin Grave   Encounter Date: 05/28/2021   PT End of Session - 05/28/21 0926     Visit Number 6    Number of Visits 25    Date for PT Re-Evaluation 08/02/21    Authorization Type [employer]; Medicaid    Authorization Time Period 05/10/21-08/02/21    Progress Note Due on Visit 10    PT Start Time 0930    PT Stop Time 1014    PT Time Calculation (min) 44 min    Equipment Utilized During Treatment Gait belt    Activity Tolerance Patient tolerated treatment well;No increased pain    Behavior During Therapy Flat affect;Impulsive             Past Medical History:  Diagnosis Date   Acute heart failure ([place])    Aortic stenosis    CHF (congestive heart failure) (HCC)    Complication of anesthesia    Hard to wake patient up after having anesthesia   Diabetes mellitus without complication ([place])    Diabetic neuropathy ([place])    Takes Lyrica   Edema of both legs    Takes Lasix   GERD (gastroesophageal reflux disease)    High cholesterol    HTN (hypertension)    Hypertension    PAD (peripheral artery disease) (HCC)    Shortness of breath dyspnea    Spasm of back muscles    Stroke (HCC)    Wound, open    Right great toe    Past Surgical History:  Procedure Laterality Date   AMPUTATION TOE Left 06/09/2019   Procedure: Left third toe and partial great toe amputation and debridement;  Surgeon: Katha Cabal, MD;  Location: [facility];  Service: Vascular;  Laterality: Left;   AMPUTATION TOE Left 04/06/2020   Procedure: AMPUTATION LEFT SECOND TOE;  Surgeon: Samara Deist, DPM;  Location: [facility];  Service: Podiatry;  Laterality: Left;   BYPASS GRAFT POPLITEAL TO TIBIAL Right 02/28/2016    Procedure: BYPASS GRAFT RIGHT BELOW KNEE POPLITEAL TO PERONEAL USING REVERSED RIGHT GREATER SAPHENOUS VEIN;  Surgeon: Conrad Advance, MD;  Location: [place];  Service: Vascular;  Laterality: Right;   CYST EXCISION     Abdomen   EYE SURGERY Bilateral    Cataract removal   INTRAMEDULLARY (IM) NAIL INTERTROCHANTERIC Left 02/04/2014   Procedure: INTRAMEDULLARY (IM) NAIL INTERTROCHANTRIC FEMORAL;  Surgeon: Mauri Pole, MD;  Location: [place];  Service: Orthopedics;  Laterality: Left;   IR GENERIC HISTORICAL  03/01/2016   IR ANGIO INTRA EXTRACRAN SEL COM CAROTID INNOMINATE UNI R MOD SED 03/01/2016 [name], MD [facility]   IR GENERIC HISTORICAL  03/01/2016   IR ENDOVASC INTRACRANIAL INF OTHER THAN THROMBO ART INC DIAG ANGIO 03/01/2016 [name], MD [facility]   IR GENERIC HISTORICAL  03/01/2016   IR INTRAVSC STENT CERV CAROTID W/O EMB-PROT MOD SED INC ANGIO 03/01/2016 [name], MD [facility]   IR GENERIC HISTORICAL  06/03/2016   IR RADIOLOGIST EVAL & MGMT 06/03/2016 [facility]   LOWER EXTREMITY ANGIOGRAPHY Left 02/09/2019   Procedure: LOWER EXTREMITY ANGIOGRAPHY;  Surgeon: Katha Cabal, MD;  Location: [place] [facility];  Service: Cardiovascular;  Laterality: Left;   LOWER EXTREMITY ANGIOGRAPHY Left 03/10/2019  Procedure: LOWER EXTREMITY ANGIOGRAPHY;  Surgeon: Katha Cabal, MD;  Location: [place] [facility];  Service: Cardiovascular;  Laterality: Left;   LOWER EXTREMITY ANGIOGRAPHY Left 04/27/2019   Procedure: LOWER EXTREMITY ANGIOGRAPHY;  Surgeon: Katha Cabal, MD;  Location: [place] [facility];  Service: Cardiovascular;  Laterality: Left;   LOWER EXTREMITY ANGIOGRAPHY Left 06/08/2019   Procedure: Lower Extremity Angiography;  Surgeon: Katha Cabal, MD;  Location: [place] [facility];  Service: Cardiovascular;  Laterality: Left;   LOWER EXTREMITY ANGIOGRAPHY Left 04/03/2020   Procedure: Lower Extremity Angiography;  Surgeon: Algernon Huxley, MD;   Location: [place] [facility];  Service: Cardiovascular;  Laterality: Left;   LOWER EXTREMITY ANGIOGRAPHY Right 10/20/2020   Procedure: Lower Extremity Angiography;  Surgeon: Katha Cabal, MD;  Location: [place] [facility];  Service: Cardiovascular;  Laterality: Right;   LOWER EXTREMITY ANGIOGRAPHY Left 01/23/2021   Procedure: LOWER EXTREMITY ANGIOGRAPHY;  Surgeon: Katha Cabal, MD;  Location: [place] [facility];  Service: Cardiovascular;  Laterality: Left;   LOWER EXTREMITY ANGIOGRAPHY Right 04/10/2021   Procedure: LOWER EXTREMITY ANGIOGRAPHY;  Surgeon: Katha Cabal, MD;  Location: [place] [facility];  Service: Cardiovascular;  Laterality: Right;   ORIF TOE FRACTURE Right 02/08/2014   Procedure: OPEN REDUCTION INTERNAL FIXATION Right METATARSAL  FRACTURE ;  Surgeon: Wylene Simmer, MD;  Location: [place];  Service: Orthopedics;  Laterality: Right;   PERIPHERAL VASCULAR CATHETERIZATION N/A 12/28/2015   Procedure: Abdominal Aortogram w/Lower Extremity;  Surgeon: Conrad Stoutland, MD;  Location: [place] [facility];  Service: Cardiovascular;  Laterality: N/A;   RADIOLOGY WITH ANESTHESIA N/A 03/01/2016   Procedure: RADIOLOGY WITH ANESTHESIA;  Surgeon: [name], MD;  Location: [place];  Service: Radiology;  Laterality: N/A;   VEIN HARVEST Right 02/28/2016   Procedure: RIGHT GREATER SAPHENOUS VEIN HARVEST;  Surgeon: Conrad [place], MD;  Location: [place];  Service: Vascular;  Laterality: Right;    There were no vitals filed for this visit.   Subjective Assessment - 05/28/21 1026     Subjective Patient reports they got her up in a chair yesterday. No falls or LOB, has been compliant with HEP.    Patient is accompained by: Family member    Pertinent History 15yoF presenting to [place] to address recent gradual progressive decline in mobility, family goals for improved strength, to promote > participation in transfers to wheelchair and safety with ADL performance. Recent illness includes  admission to [facility] on 03/07/21-03/10/21 due to COVID infection c AMS and fever. Pt then underwent outpatient recanalization/limb salvage of RLE 04/10/2021; LLE salvage performed in June 2022. Other PMH: CHF, DM, HTN, CVA, COPD, multiple remote toe amputations, stg 2 sacral ulcer. PLOF includes 3-4 years bed bound status, hoyerlifts to WC, totalA for ADL performance. Pt is cared for by family, as well as professional caregiver services.    Limitations Sitting;Walking;Lifting;Standing;House hold activities    How long can you sit comfortably? Pt spouse reports 2 hours. Must be careful d/t preventing and trying to heal wounds on bottom.    How long can you stand comfortably? unable    How long can you walk comfortably? Unable    Diagnostic tests 6/21 and 9/06 peripheral vascular catheterizatoin; 04/10/2021 Successful recanalization right lower extremity for limb salvage    Patient Stated Goals to strengthen pt's trunk to improve mobility in chair, assist with ADLs    Currently in Pain? No/denies  INTERVENTIONS:       Seated in chair:  Forward/posterior weight shift with focus on pulling on PT hands for upright posture, hold 3 seconds ; 10x ; 2 sets  2lb ankle weights: -LAQ 10x each LE ,max cueing for sequencing -hip flexion/march 10x each LE; very challenging for patient, unable to clear table, frequent confusion with LAQ    Dynamic leaning to right and then left side x 10 reps each direction to off load weight. Repetitive VC for technique   Ankle pf 10x each LE, very challenging for LLE  Seated hip add with ball squeeze - 5 sec hold with VC to hold contraction. 10x    RTB abduction 10x with cues for activation of musculature.    Rainbow ball: -toss with PT, and SPT for varying arc of motion and pertubations x 4 minutes Lateral toss to PT and SPT for rotational twist and weight shift x 3 minutes Bilateral UE raises 10x   Mirror correction for posture x 3 minutes  with patient able to self correct  RTB bilateral  12x   Sit to stand with walker, x 2 person assistance with two placement hold x 3 trials of mod A. Patient c/o of foot pain throughout but able to tolerate short trials.   Pt educated throughout session about proper posture and technique with exercises. Improved exercise technique, movement at target joints, use of target muscles after min to mod verbal, visual, tactile cues.  Patient was able to stand for first time this session in >2 years. Patient requires x 2 assistance with mod A for sit to stand transition and was able to maintain with walker for ~ 5-10 seconds. Patient is more awake this session and able to follow more complex commands. Patient will benefit from further skilled PT to increase strength, range of motion, mobility, and seated balance to increase ease and safety with ADLs               PT Education - 05/28/21 0926     Education Details exercise technique, body mechanics    Person(s) Educated Patient    Methods Explanation;Demonstration;Tactile cues;Verbal cues    Comprehension Verbalized understanding;Returned demonstration;Verbal cues required;Tactile cues required              PT Short Term Goals - 05/11/21 1329       PT SHORT TERM GOAL #1   Title Patient will be independent in home exercise program to improve strength/mobility for better functional independence with ADLs.    Baseline 10/6: to be initiated    Time 6    Period Weeks    Status New    Target Date 06/21/21               PT Long Term Goals - 05/11/21 1330       PT LONG TERM GOAL #1   Title Patient will increase FOTO score to equal to or greater than 44 to demonstrate statistically significant improvement in mobility and quality of life.    Baseline 10/6: 26    Time 12    Period Weeks    Status New    Target Date 08/02/21      PT LONG TERM GOAL #2   Title Patient will roll L and R in bed with mod A for decreased  caregiver burden for ADLs and toileting.    Baseline 10/6: per pt spouse, pt currently dependent    Time 12    Period Weeks  Status New    Target Date 08/02/21      PT LONG TERM GOAL #3   Title Patient will sit upright in WC, with BUE support and without leaning on backrest for 10 minutes for ADL performance and pressure relief.    Baseline 10/6: pt exhibits core contraction but unable to sit upright    Time 12    Period Weeks    Status New    Target Date 08/02/21      PT LONG TERM GOAL #4   Title Pt will demonstrate ability to utilize BUEs to weight-shift to each side in WC and maintain weight shift for 30 seconds to assist in pressure relief and reduece risk of wound formation.    Baseline 10/6: pt currently unable    Time 12    Period Weeks    Status New    Target Date 08/02/21                   Plan - 05/28/21 1043     Clinical Impression Statement Patient was able to stand for first time this session in >2 years. Patient requires x 2 assistance with mod A for sit to stand transition and was able to maintain with walker for ~ 5-10 seconds. Patient is more awake this session and able to follow more complex commands. Patient will benefit from further skilled PT to increase strength, range of motion, mobility, and seated balance to increase ease and safety with ADLs    Personal Factors and Comorbidities Age;Time since onset of injury/illness/exacerbation;Comorbidity 3+;Past/Current Experience;Education;Social Background;Transportation;Sex;Fitness    Comorbidities diastolic congestive heart failure, diabetes mellitus, diabetic neuropathy, hypertension, hyperlipidemia, GERD, peripheral vascular disease, CVA. on 9/2 had amputation of L foot second toe MTP joint and LLE revascularization, recent hx of COVID19    Examination-Activity Limitations Bathing;Hygiene/Grooming;Squat;Bed Mobility;Stairs;Lift;Bend;Locomotion Level;Stand;Caring for [employer];Toileting;Carry;Self Feeding;Transfers;Continence;Dressing;Other;Sit    Examination-Participation Restrictions Cleaning;Shop;Community Activity;Meal Prep;Driving;Other    Stability/Clinical Decision Making Evolving/Moderate complexity    Rehab Potential Poor    PT Frequency 2x / week    PT Duration 12 weeks    PT Treatment/Interventions ADLs/Self Care Home Management;DME Instruction;Functional mobility training;Therapeutic activities;Therapeutic exercise;Neuromuscular re-education;Patient/family education;Wheelchair mobility training;Passive range of motion;Energy conservation;Cryotherapy;Biofeedback;Electrical Stimulation;Iontophoresis 4mg /ml Dexamethasone;Moist Heat;Traction;Ultrasound;Stair training;Cognitive remediation;Balance training;Orthotic Fit/Training;Manual techniques;Manual lymph drainage;Splinting;Taping;Vasopneumatic Device;Vestibular;Visual/perceptual remediation/compensation;Gait training    PT Next Visit Plan bed mobility, pressure relief techniques;    PT Home Exercise Plan Previous- Seated lateral weight shift pressure relief technique in wheelchair to be performed 5 days/week for 2 sets of 10 repetitions each side.  PT provided patient with picture of technique as well.  05/21/2021- Access Code: [email or web address]    Consulted and Agree with Plan of Care Family member/caregiver    Family Member Consulted Spouse             Patient will benefit from skilled therapeutic intervention in order to improve the following deficits and impairments:  Decreased cognition, Decreased skin integrity, Impaired sensation, Decreased mobility, Postural dysfunction, Decreased activity tolerance, Decreased endurance, Decreased range of motion, Decreased strength, Decreased balance, Decreased safety awareness, Increased edema, Impaired flexibility, Decreased coordination, Cardiopulmonary status limiting activity, Abnormal gait, Impaired UE functional use, Impaired tone, Improper body mechanics,  Pain, Difficulty walking, Decreased knowledge of use of DME, Hypomobility, Impaired perceived functional ability  Visit Diagnosis: Other abnormalities of gait and mobility  Abnormal posture  Other lack of coordination  Unsteadiness on feet     Problem List Patient Active Problem List   Diagnosis Date Noted  Atherosclerosis of native arteries of extremity with rest pain ([place]) 04/20/2021   COVID-19 virus infection 03/07/2021   Acute febrile illness 03/07/2021   Hypoalbuminemia 03/07/2021   Hyperglycemia due to diabetes mellitus ([place]) 03/07/2021   Elevated troponin 03/07/2021   Constipation 03/07/2021   COPD (chronic obstructive pulmonary disease) ([place]) 10/20/2020   GERD (gastroesophageal reflux disease) 10/20/2020   Physical deconditioning 10/20/2020   Cellulitis 10/19/2020   Necrotic toes (HCC)    Insulin dependent diabetes mellitus type IA ([place])    Diabetic neuropathy ([place])    Gangrene of toe of both feet ([place]) 06/06/2019   Peripheral arterial disease ([place]) 04/27/2019   ESBL (extended spectrum beta-lactamase) producing bacteria infection 12/25/2018   PVD (peripheral vascular disease) ([place]) 09/12/2016   Ulcer of right lower extremity ([place]) 08/29/2016   Acute on chronic diastolic heart failure ([place]) 07/08/2016   HTN (hypertension) 07/05/2016   Sensorineural hearing loss (SNHL), 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   Chronic diastolic CHF (congestive heart failure) ([place]) 03/10/2016   HLD (hyperlipidemia) 03/09/2016   Acute respiratory acidosis ([place]) 03/05/2016   Anemia, iron deficiency 03/03/2016   Cerebrovascular accident (CVA) due to thrombosis of precerebral artery ([place]) 03/01/2016   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 ([place]) 03/01/2016   Atherosclerosis of native arteries of the extremities with gangrene ([place]) 12/08/2015   Anemia, chronic disease 04/27/2014   Unspecified hereditary and idiopathic peripheral neuropathy 03/14/2014   DM2 (diabetes mellitus, type  2) ([place]) 02/14/2014   Aortic stenosis 02/04/2014   Closed left subtrochanteric femur fracture ([place]) 02/03/2014   Hip fracture, left ([place]) 02/03/2014   Fibula fracture 02/03/2014   MTP instability 02/03/2014   Hip fracture ([place]) 02/03/2014    Janna Arch, PT, DPT  05/28/2021, 10:44 AM  [place] MAIN [facility] SERVICES [place], [place], [address] Phone: [phone]   Fax:  [phone]  Name: [name] MRN: [id number] Date of Birth: 1940/12/29

## 2021-05-29 ENCOUNTER — Ambulatory Visit: Payer: Medicare HMO

## 2021-05-30 ENCOUNTER — Other Ambulatory Visit: Payer: Self-pay

## 2021-05-30 ENCOUNTER — Ambulatory Visit: Payer: Medicare HMO

## 2021-05-30 DIAGNOSIS — R293 Abnormal posture: Secondary | ICD-10-CM

## 2021-05-30 DIAGNOSIS — M6281 Muscle weakness (generalized): Secondary | ICD-10-CM | POA: Diagnosis not present

## 2021-05-30 DIAGNOSIS — R2681 Unsteadiness on feet: Secondary | ICD-10-CM

## 2021-05-30 DIAGNOSIS — R2689 Other abnormalities of gait and mobility: Secondary | ICD-10-CM

## 2021-05-30 NOTE — Therapy (Signed)
[place] MAIN [facility] SERVICES [address] [place], [place], [address] Phone: [phone]   Fax:  [phone]  Physical Therapy Treatment  Patient Details  Name: [name] MRN: [id number] Date of Birth: 04-16-1941 Referring Provider (PT): Sharin Grave   Encounter Date: 05/30/2021   PT End of Session - 05/30/21 1342     Visit Number 7    Number of Visits 25    Date for PT Re-Evaluation 08/02/21    Authorization Type [employer]; Medicaid    Authorization Time Period 05/10/21-08/02/21    Progress Note Due on Visit 10    PT Start Time 0930    PT Stop Time 1015    PT Time Calculation (min) 45 min    Equipment Utilized During Treatment Gait belt    Activity Tolerance Patient tolerated treatment well;No increased pain    Behavior During Therapy Flat affect;Impulsive             Past Medical History:  Diagnosis Date   Acute heart failure ([place])    Aortic stenosis    CHF (congestive heart failure) (HCC)    Complication of anesthesia    Hard to wake patient up after having anesthesia   Diabetes mellitus without complication ([place])    Diabetic neuropathy ([place])    Takes Lyrica   Edema of both legs    Takes Lasix   GERD (gastroesophageal reflux disease)    High cholesterol    HTN (hypertension)    Hypertension    PAD (peripheral artery disease) (HCC)    Shortness of breath dyspnea    Spasm of back muscles    Stroke (HCC)    Wound, open    Right great toe    Past Surgical History:  Procedure Laterality Date   AMPUTATION TOE Left 06/09/2019   Procedure: Left third toe and partial great toe amputation and debridement;  Surgeon: Katha Cabal, MD;  Location: [facility];  Service: Vascular;  Laterality: Left;   AMPUTATION TOE Left 04/06/2020   Procedure: AMPUTATION LEFT SECOND TOE;  Surgeon: Samara Deist, DPM;  Location: [facility];  Service: Podiatry;  Laterality: Left;   BYPASS GRAFT POPLITEAL TO TIBIAL Right 02/28/2016    Procedure: BYPASS GRAFT RIGHT BELOW KNEE POPLITEAL TO PERONEAL USING REVERSED RIGHT GREATER SAPHENOUS VEIN;  Surgeon: Conrad Cassia, MD;  Location: [place];  Service: Vascular;  Laterality: Right;   CYST EXCISION     Abdomen   EYE SURGERY Bilateral    Cataract removal   INTRAMEDULLARY (IM) NAIL INTERTROCHANTERIC Left 02/04/2014   Procedure: INTRAMEDULLARY (IM) NAIL INTERTROCHANTRIC FEMORAL;  Surgeon: Mauri Pole, MD;  Location: [place];  Service: Orthopedics;  Laterality: Left;   IR GENERIC HISTORICAL  03/01/2016   IR ANGIO INTRA EXTRACRAN SEL COM CAROTID INNOMINATE UNI R MOD SED 03/01/2016 [name], MD [facility]   IR GENERIC HISTORICAL  03/01/2016   IR ENDOVASC INTRACRANIAL INF OTHER THAN THROMBO ART INC DIAG ANGIO 03/01/2016 [name], MD [facility]   IR GENERIC HISTORICAL  03/01/2016   IR INTRAVSC STENT CERV CAROTID W/O EMB-PROT MOD SED INC ANGIO 03/01/2016 [name], MD [facility]   IR GENERIC HISTORICAL  06/03/2016   IR RADIOLOGIST EVAL & MGMT 06/03/2016 [facility]   LOWER EXTREMITY ANGIOGRAPHY Left 02/09/2019   Procedure: LOWER EXTREMITY ANGIOGRAPHY;  Surgeon: Katha Cabal, MD;  Location: [place] [facility];  Service: Cardiovascular;  Laterality: Left;   LOWER EXTREMITY ANGIOGRAPHY Left 03/10/2019  Procedure: LOWER EXTREMITY ANGIOGRAPHY;  Surgeon: Katha Cabal, MD;  Location: [place] [facility];  Service: Cardiovascular;  Laterality: Left;   LOWER EXTREMITY ANGIOGRAPHY Left 04/27/2019   Procedure: LOWER EXTREMITY ANGIOGRAPHY;  Surgeon: Katha Cabal, MD;  Location: [place] [facility];  Service: Cardiovascular;  Laterality: Left;   LOWER EXTREMITY ANGIOGRAPHY Left 06/08/2019   Procedure: Lower Extremity Angiography;  Surgeon: Katha Cabal, MD;  Location: [place] [facility];  Service: Cardiovascular;  Laterality: Left;   LOWER EXTREMITY ANGIOGRAPHY Left 04/03/2020   Procedure: Lower Extremity Angiography;  Surgeon: Algernon Huxley, MD;   Location: [place] [facility];  Service: Cardiovascular;  Laterality: Left;   LOWER EXTREMITY ANGIOGRAPHY Right 10/20/2020   Procedure: Lower Extremity Angiography;  Surgeon: Katha Cabal, MD;  Location: [place] [facility];  Service: Cardiovascular;  Laterality: Right;   LOWER EXTREMITY ANGIOGRAPHY Left 01/23/2021   Procedure: LOWER EXTREMITY ANGIOGRAPHY;  Surgeon: Katha Cabal, MD;  Location: [place] [facility];  Service: Cardiovascular;  Laterality: Left;   LOWER EXTREMITY ANGIOGRAPHY Right 04/10/2021   Procedure: LOWER EXTREMITY ANGIOGRAPHY;  Surgeon: Katha Cabal, MD;  Location: [place] [facility];  Service: Cardiovascular;  Laterality: Right;   ORIF TOE FRACTURE Right 02/08/2014   Procedure: OPEN REDUCTION INTERNAL FIXATION Right METATARSAL  FRACTURE ;  Surgeon: Wylene Simmer, MD;  Location: [place];  Service: Orthopedics;  Laterality: Right;   PERIPHERAL VASCULAR CATHETERIZATION N/A 12/28/2015   Procedure: Abdominal Aortogram w/Lower Extremity;  Surgeon: Conrad [place], MD;  Location: [place] [facility];  Service: Cardiovascular;  Laterality: N/A;   RADIOLOGY WITH ANESTHESIA N/A 03/01/2016   Procedure: RADIOLOGY WITH ANESTHESIA;  Surgeon: [name], MD;  Location: [place];  Service: Radiology;  Laterality: N/A;   VEIN HARVEST Right 02/28/2016   Procedure: RIGHT GREATER SAPHENOUS VEIN HARVEST;  Surgeon: Conrad [place], MD;  Location: [place];  Service: Vascular;  Laterality: Right;    There were no vitals filed for this visit.   Subjective Assessment - 05/30/21 0935     Subjective Patient spent the day in bed yesterday. No falls or LOB since last session. Patient reports foot pain.    Patient is accompained by: Family member    Pertinent History 70yoF presenting to [facility] to address recent gradual progressive decline in mobility, family goals for improved strength, to promote > participation in transfers to wheelchair and safety with ADL performance. Recent illness includes  admission to [facility] on [facility]/07/21-03/10/21 due to COVID infection c AMS and fever. Pt then underwent outpatient recanalization/limb salvage of RLE 04/10/2021; LLE salvage performed in June 2022. Other PMH: CHF, DM, HTN, CVA, COPD, multiple remote toe amputations, stg 2 sacral ulcer. PLOF includes 3-4 years bed bound status, hoyerlifts to WC, totalA for ADL performance. Pt is cared for by family, as well as professional caregiver services.    Limitations Sitting;Walking;Lifting;Standing;House hold activities    How long can you sit comfortably? Pt spouse reports 2 hours. Must be careful d/t preventing and trying to heal wounds on bottom.    How long can you stand comfortably? unable    How long can you walk comfortably? Unable    Diagnostic tests 6/21 and 9/06 peripheral vascular catheterizatoin; 04/10/2021 Successful recanalization right lower extremity for limb salvage    Patient Stated Goals to strengthen pt's trunk to improve mobility in chair, assist with ADLs    Currently in Pain? No/denies  INTERVENTIONS:        Seated in chair:  Forward/posterior weight shift with focus on pulling on PT hands for upright posture, hold 3 seconds ; 10x ; 2 sets   2lb ankle weights: -LAQ 10x each LE ,max cueing for sequencing ; x 2 sets  -hip flexion/march 10x each LE; very challenging for patient, unable to clear table, frequent confusion with LAQ    Dynamic leaning to right and then left side x 10 reps each direction to off load weight. Repetitive VC for technique   Ankle pf 10x each LE, very challenging for LLE  1lb dumbbells: -curls 10x each LE -alternating punches 12x each LE -abduction 10x    Rainbow ball: -toss with PT, for varying arc of motion and pertubations x 4 minutes -Lateral toss to PT  or rotational twist and weight shift x 12 reps -adduction squeeze 10x    Mirror correction for posture x 3 minutes with patient able to self correct      Sit to stand with  walker, x 2 person assistance with two placement hold x 2 trials of mod A. Patient c/o of foot pain throughout but able to tolerate short trials.    Pt educated throughout session about proper posture and technique with exercises. Improved exercise technique, movement at target joints, use of target muscles after min to mod verbal, visual, tactile cues.   Patient tolerates standing twice this session. Left foot pain addressed and foot examined with no wounds noted/present. Patient fatigues quickly throughout session frequently asking for rest breaks.  Patient will benefit from further skilled PT to increase strength, range of motion, mobility, and seated balance to increase ease and safety with ADLs                    PT Education - 05/30/21 1342     Education Details exercise technique, body mechanics    Person(s) Educated Patient    Methods Explanation;Demonstration;Tactile cues;Verbal cues    Comprehension Verbalized understanding;Returned demonstration;Verbal cues required;Tactile cues required              PT Short Term Goals - 05/11/21 1329       PT SHORT TERM GOAL #1   Title Patient will be independent in home exercise program to improve strength/mobility for better functional independence with ADLs.    Baseline 10/6: to be initiated    Time 6    Period Weeks    Status New    Target Date 06/21/21               PT Long Term Goals - 05/11/21 1330       PT LONG TERM GOAL #1   Title Patient will increase FOTO score to equal to or greater than 44 to demonstrate statistically significant improvement in mobility and quality of life.    Baseline 10/6: 26    Time 12    Period Weeks    Status New    Target Date 08/02/21      PT LONG TERM GOAL #2   Title Patient will roll L and R in bed with mod A for decreased caregiver burden for ADLs and toileting.    Baseline 10/6: per pt spouse, pt currently dependent    Time 12    Period Weeks    Status New     Target Date 08/02/21      PT LONG TERM GOAL #3   Title Patient will sit upright in WC, with  BUE support and without leaning on backrest for 10 minutes for ADL performance and pressure relief.    Baseline 10/6: pt exhibits core contraction but unable to sit upright    Time 12    Period Weeks    Status New    Target Date 08/02/21      PT LONG TERM GOAL #4   Title Pt will demonstrate ability to utilize BUEs to weight-shift to each side in WC and maintain weight shift for 30 seconds to assist in pressure relief and reduece risk of wound formation.    Baseline 10/6: pt currently unable    Time 12    Period Weeks    Status New    Target Date 08/02/21                   Plan - 05/30/21 1346     Clinical Impression Statement Patient tolerates standing twice this session. Left foot pain addressed and foot examined with no wounds noted/present. Patient fatigues quickly throughout session frequently asking for rest breaks.  Patient will benefit from further skilled PT to increase strength, range of motion, mobility, and seated balance to increase ease and safety with ADLs    Personal Factors and Comorbidities Age;Time since onset of injury/illness/exacerbation;Comorbidity 3+;Past/Current Experience;Education;Social Background;Transportation;Sex;Fitness    Comorbidities diastolic congestive heart failure, diabetes mellitus, diabetic neuropathy, hypertension, hyperlipidemia, GERD, peripheral vascular disease, CVA. on 9/2 had amputation of L foot second toe MTP joint and LLE revascularization, recent hx of COVID19    Examination-Activity Limitations Bathing;Hygiene/Grooming;Squat;Bed Mobility;Stairs;Lift;Bend;Locomotion Level;Stand;Caring for [employer];Toileting;Carry;Self Feeding;Transfers;Continence;Dressing;Other;Sit    Examination-Participation Restrictions Cleaning;Shop;Community Activity;Meal Prep;Driving;Other    Stability/Clinical Decision Making Evolving/Moderate complexity     Rehab Potential Poor    PT Frequency 2x / week    PT Duration 12 weeks    PT Treatment/Interventions ADLs/Self Care Home Management;DME Instruction;Functional mobility training;Therapeutic activities;Therapeutic exercise;Neuromuscular re-education;Patient/family education;Wheelchair mobility training;Passive range of motion;Energy conservation;Cryotherapy;Biofeedback;Electrical Stimulation;Iontophoresis 4mg /ml Dexamethasone;Moist Heat;Traction;Ultrasound;Stair training;Cognitive remediation;Balance training;Orthotic Fit/Training;Manual techniques;Manual lymph drainage;Splinting;Taping;Vasopneumatic Device;Vestibular;Visual/perceptual remediation/compensation;Gait training    PT Next Visit Plan bed mobility, pressure relief techniques;    PT Home Exercise Plan Previous- Seated lateral weight shift pressure relief technique in wheelchair to be performed 5 days/week for 2 sets of 10 repetitions each side.  PT provided patient with picture of technique as well.  05/21/2021- Access Code: [email or web address]    Consulted and Agree with Plan of Care Family member/caregiver    Family Member Consulted Spouse             Patient will benefit from skilled therapeutic intervention in order to improve the following deficits and impairments:  Decreased cognition, Decreased skin integrity, Impaired sensation, Decreased mobility, Postural dysfunction, Decreased activity tolerance, Decreased endurance, Decreased range of motion, Decreased strength, Decreased balance, Decreased safety awareness, Increased edema, Impaired flexibility, Decreased coordination, Cardiopulmonary status limiting activity, Abnormal gait, Impaired UE functional use, Impaired tone, Improper body mechanics, Pain, Difficulty walking, Decreased knowledge of use of DME, Hypomobility, Impaired perceived functional ability  Visit Diagnosis: Other abnormalities of gait and mobility  Abnormal posture  Unsteadiness on feet     Problem List Patient  Active Problem List   Diagnosis Date Noted   Atherosclerosis of native arteries of extremity with rest pain ([place]) 04/20/2021   COVID-19 virus infection 03/07/2021   Acute febrile illness 03/07/2021   Hypoalbuminemia 03/07/2021   Hyperglycemia due to diabetes mellitus ([place]) 03/07/2021   Elevated troponin 03/07/2021   Constipation 03/07/2021   COPD (chronic obstructive pulmonary disease) ([place]) 10/20/2020  GERD (gastroesophageal reflux disease) 10/20/2020   Physical deconditioning 10/20/2020   Cellulitis 10/19/2020   Necrotic toes (HCC)    Insulin dependent diabetes mellitus type IA ([place])    Diabetic neuropathy (HCC)    Gangrene of toe of both feet ([place]) 06/06/2019   Peripheral arterial disease ([place]) 04/27/2019   ESBL (extended spectrum beta-lactamase) producing bacteria infection 12/25/2018   PVD (peripheral vascular disease) ([place]) 09/12/2016   Ulcer of right lower extremity ([place]) 08/29/2016   Acute on chronic diastolic heart failure ([place]) 07/08/2016   HTN (hypertension) 07/05/2016   Sensorineural hearing loss (SNHL), 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   Chronic diastolic CHF (congestive heart failure) ([place]) 03/10/2016   HLD (hyperlipidemia) 03/09/2016   Acute respiratory acidosis ([place]) 03/05/2016   Anemia, iron deficiency 03/03/2016   Cerebrovascular accident (CVA) due to thrombosis of precerebral artery ([place]) 03/01/2016   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 ([place]) 03/01/2016   Atherosclerosis of native arteries of the extremities with gangrene ([place]) 12/08/2015   Anemia, chronic disease 04/27/2014   Unspecified hereditary and idiopathic peripheral neuropathy 03/14/2014   DM2 (diabetes mellitus, type 2) ([place]) 02/14/2014   Aortic stenosis 02/04/2014   Closed left subtrochanteric femur fracture ([place]) 02/03/2014   Hip fracture, left ([place]) 02/03/2014   Fibula fracture 02/03/2014   MTP instability 02/03/2014   Hip fracture ([place]) 02/03/2014   Janna Arch, PT,  DPT  05/30/2021, 1:47 PM  [facility] MAIN [facility] SERVICES [place], [place], [address] Phone: [phone]   Fax:  [phone]  Name: [name] MRN: [id number] Date of Birth: 07/18/1941

## 2021-05-31 ENCOUNTER — Ambulatory Visit: Payer: Medicare HMO

## 2021-06-05 ENCOUNTER — Encounter: Payer: Self-pay | Admitting: Physical Therapy

## 2021-06-05 ENCOUNTER — Other Ambulatory Visit: Payer: Self-pay

## 2021-06-05 ENCOUNTER — Ambulatory Visit: Payer: Medicare HMO | Attending: Family Medicine | Admitting: Physical Therapy

## 2021-06-05 ENCOUNTER — Other Ambulatory Visit
Admission: RE | Admit: 2021-06-05 | Discharge: 2021-06-05 | Disposition: A | Discharge status: Home / Self Care | Payer: Medicare HMO | Attending: Family Medicine | Admitting: Family Medicine

## 2021-06-05 DIAGNOSIS — E1165 Type 2 diabetes mellitus with hyperglycemia: Secondary | ICD-10-CM | POA: Diagnosis not present

## 2021-06-05 DIAGNOSIS — R2681 Unsteadiness on feet: Secondary | ICD-10-CM | POA: Insufficient documentation

## 2021-06-05 DIAGNOSIS — E7800 Pure hypercholesterolemia, unspecified: Secondary | ICD-10-CM | POA: Diagnosis not present

## 2021-06-05 DIAGNOSIS — R2689 Other abnormalities of gait and mobility: Secondary | ICD-10-CM | POA: Insufficient documentation

## 2021-06-05 DIAGNOSIS — R293 Abnormal posture: Secondary | ICD-10-CM | POA: Insufficient documentation

## 2021-06-05 DIAGNOSIS — R269 Unspecified abnormalities of gait and mobility: Secondary | ICD-10-CM | POA: Insufficient documentation

## 2021-06-05 DIAGNOSIS — R278 Other lack of coordination: Secondary | ICD-10-CM | POA: Insufficient documentation

## 2021-06-05 DIAGNOSIS — E46 Unspecified protein-calorie malnutrition: Secondary | ICD-10-CM | POA: Diagnosis not present

## 2021-06-05 DIAGNOSIS — R262 Difficulty in walking, not elsewhere classified: Secondary | ICD-10-CM | POA: Insufficient documentation

## 2021-06-05 DIAGNOSIS — M6281 Muscle weakness (generalized): Secondary | ICD-10-CM | POA: Insufficient documentation

## 2021-06-05 LAB — CBC WITH DIFFERENTIAL/PLATELET
Abs Immature Granulocytes: 0.03 10*3/uL (ref 0.00–0.07)
Basophils Absolute: 0.1 10*3/uL (ref 0.0–0.1)
Basophils Relative: 1 %
Eosinophils Absolute: 0.3 10*3/uL (ref 0.0–0.5)
Eosinophils Relative: 3 %
HCT: 33.8 % — ABNORMAL LOW (ref 36.0–46.0)
Hemoglobin: 10.7 g/dL — ABNORMAL LOW (ref 12.0–15.0)
Immature Granulocytes: 0 %
Lymphocytes Relative: 20 %
Lymphs Abs: 2 10*3/uL (ref 0.7–4.0)
MCH: 25.5 pg — ABNORMAL LOW (ref 26.0–34.0)
MCHC: 31.7 g/dL (ref 30.0–36.0)
MCV: 80.5 fL (ref 80.0–100.0)
Monocytes Absolute: 0.5 10*3/uL (ref 0.1–1.0)
Monocytes Relative: 5 %
Neutro Abs: 7 10*3/uL (ref 1.7–7.7)
Neutrophils Relative %: 71 %
Platelets: 261 10*3/uL (ref 150–400)
RBC: 4.2 MIL/uL (ref 3.87–5.11)
RDW: 16 % — ABNORMAL HIGH (ref 11.5–15.5)
WBC: 9.9 10*3/uL (ref 4.0–10.5)
nRBC: 0 % (ref 0.0–0.2)

## 2021-06-05 LAB — COMPREHENSIVE METABOLIC PANEL
ALT: 22 U/L (ref 0–44)
AST: 21 U/L (ref 15–41)
Albumin: 3.1 g/dL — ABNORMAL LOW (ref 3.5–5.0)
Alkaline Phosphatase: 66 U/L (ref 38–126)
Anion gap: 7 (ref 5–15)
BUN: 16 mg/dL (ref 8–23)
CO2: 29 mmol/L (ref 22–32)
Calcium: 9.4 mg/dL (ref 8.9–10.3)
Chloride: 103 mmol/L (ref 98–111)
Creatinine, Ser: 0.49 mg/dL (ref 0.44–1.00)
GFR, Estimated: >60 mL/min (ref >60)
Glucose, Bld: 212 mg/dL — ABNORMAL HIGH (ref 70–99)
Potassium: 4.5 mmol/L (ref 3.5–5.1)
Sodium: 139 mmol/L (ref 135–145)
Total Bilirubin: 0.7 mg/dL (ref 0.3–1.2)
Total Protein: 6.4 g/dL — ABNORMAL LOW (ref 6.5–8.1)

## 2021-06-05 LAB — VITAMIN D 25 HYDROXY (VIT D DEFICIENCY, FRACTURES): Vit D, 25-Hydroxy: 29.05 ng/mL — ABNORMAL LOW (ref 30–100)

## 2021-06-05 LAB — HEMOGLOBIN A1C
Hgb A1c MFr Bld: 7.7 % — ABNORMAL HIGH (ref 4.8–5.6)
Mean Plasma Glucose: 174.29 mg/dL

## 2021-06-05 LAB — LIPID PANEL
Cholesterol: 180 mg/dL (ref 0–200)
HDL: 48 mg/dL (ref >40)
LDL Cholesterol: 106 mg/dL — ABNORMAL HIGH (ref 0–99)
Total CHOL/HDL Ratio: 3.8 RATIO
Triglycerides: 130 mg/dL (ref <150)
VLDL: 26 mg/dL (ref 0–40)

## 2021-06-05 NOTE — Therapy (Signed)
[facility] MAIN [facility] SERVICES [address] [place], [place], [address] Phone: [phone]   Fax:  [phone]  Physical Therapy Treatment  Patient Details  Name: [name] MRN: [id number] Date of Birth: July 21, 1941 Referring Provider (PT): Sharin Grave   Encounter Date: 06/05/2021   PT End of Session - 06/05/21 1240     Visit Number 8    Number of Visits 25    Date for PT Re-Evaluation 08/02/21    Authorization Type [employer]; Medicaid    Authorization Time Period 05/10/21-08/02/21    Progress Note Due on Visit 10    PT Start Time 1149    PT Stop Time 1228    PT Time Calculation (min) 39 min    Activity Tolerance Patient tolerated treatment well;No increased pain;Patient limited by fatigue    Behavior During Therapy Flat affect;Impulsive             Past Medical History:  Diagnosis Date   Acute heart failure ([place])    Aortic stenosis    CHF (congestive heart failure) (HCC)    Complication of anesthesia    Hard to wake patient up after having anesthesia   Diabetes mellitus without complication ([place])    Diabetic neuropathy ([place])    Takes Lyrica   Edema of both legs    Takes Lasix   GERD (gastroesophageal reflux disease)    High cholesterol    HTN (hypertension)    Hypertension    PAD (peripheral artery disease) (HCC)    Shortness of breath dyspnea    Spasm of back muscles    Stroke (HCC)    Wound, open    Right great toe    Past Surgical History:  Procedure Laterality Date   AMPUTATION TOE Left 06/09/2019   Procedure: Left third toe and partial great toe amputation and debridement;  Surgeon: Katha Cabal, MD;  Location: [facility];  Service: Vascular;  Laterality: Left;   AMPUTATION TOE Left 04/06/2020   Procedure: AMPUTATION LEFT SECOND TOE;  Surgeon: Samara Deist, DPM;  Location: [facility];  Service: Podiatry;  Laterality: Left;   BYPASS GRAFT POPLITEAL TO TIBIAL Right 02/28/2016   Procedure: BYPASS GRAFT  RIGHT BELOW KNEE POPLITEAL TO PERONEAL USING REVERSED RIGHT GREATER SAPHENOUS VEIN;  Surgeon: Conrad Shreve, MD;  Location: [place];  Service: Vascular;  Laterality: Right;   CYST EXCISION     Abdomen   EYE SURGERY Bilateral    Cataract removal   INTRAMEDULLARY (IM) NAIL INTERTROCHANTERIC Left 02/04/2014   Procedure: INTRAMEDULLARY (IM) NAIL INTERTROCHANTRIC FEMORAL;  Surgeon: Mauri Pole, MD;  Location: [place];  Service: Orthopedics;  Laterality: Left;   IR GENERIC HISTORICAL  03/01/2016   IR ANGIO INTRA EXTRACRAN SEL COM CAROTID INNOMINATE UNI R MOD SED 03/01/2016 [name], MD [facility]   IR GENERIC HISTORICAL  03/01/2016   IR ENDOVASC INTRACRANIAL INF OTHER THAN THROMBO ART INC DIAG ANGIO 03/01/2016 [name], MD [facility]   IR GENERIC HISTORICAL  03/01/2016   IR INTRAVSC STENT CERV CAROTID W/O EMB-PROT MOD SED INC ANGIO 03/01/2016 [name], MD [facility]   IR GENERIC HISTORICAL  06/03/2016   IR RADIOLOGIST EVAL & MGMT 06/03/2016 [facility]   LOWER EXTREMITY ANGIOGRAPHY Left 02/09/2019   Procedure: LOWER EXTREMITY ANGIOGRAPHY;  Surgeon: Katha Cabal, MD;  Location: [place] [facility];  Service: Cardiovascular;  Laterality: Left;   LOWER EXTREMITY ANGIOGRAPHY Left 03/10/2019   Procedure: LOWER EXTREMITY ANGIOGRAPHY;  Surgeon: Katha Cabal, MD;  Location: [place] [facility];  Service: Cardiovascular;  Laterality: Left;   LOWER EXTREMITY ANGIOGRAPHY Left 04/27/2019   Procedure: LOWER EXTREMITY ANGIOGRAPHY;  Surgeon: Katha Cabal, MD;  Location: [place] [facility];  Service: Cardiovascular;  Laterality: Left;   LOWER EXTREMITY ANGIOGRAPHY Left 06/08/2019   Procedure: Lower Extremity Angiography;  Surgeon: Katha Cabal, MD;  Location: [place] [facility];  Service: Cardiovascular;  Laterality: Left;   LOWER EXTREMITY ANGIOGRAPHY Left 04/03/2020   Procedure: Lower Extremity Angiography;  Surgeon: Algernon Huxley, MD;  Location: [place] [facility];  Service: Cardiovascular;  Laterality: Left;   LOWER EXTREMITY ANGIOGRAPHY Right 10/20/2020   Procedure: Lower Extremity Angiography;  Surgeon: Katha Cabal, MD;  Location: [place] [facility];  Service: Cardiovascular;  Laterality: Right;   LOWER EXTREMITY ANGIOGRAPHY Left 01/23/2021   Procedure: LOWER EXTREMITY ANGIOGRAPHY;  Surgeon: Katha Cabal, MD;  Location: [place] [facility];  Service: Cardiovascular;  Laterality: Left;   LOWER EXTREMITY ANGIOGRAPHY Right 04/10/2021   Procedure: LOWER EXTREMITY ANGIOGRAPHY;  Surgeon: Katha Cabal, MD;  Location: [place] [facility];  Service: Cardiovascular;  Laterality: Right;   ORIF TOE FRACTURE Right 02/08/2014   Procedure: OPEN REDUCTION INTERNAL FIXATION Right METATARSAL  FRACTURE ;  Surgeon: Wylene Simmer, MD;  Location: [place];  Service: Orthopedics;  Laterality: Right;   PERIPHERAL VASCULAR CATHETERIZATION N/A 12/28/2015   Procedure: Abdominal Aortogram w/Lower Extremity;  Surgeon: Conrad Shaniko, MD;  Location: [place] [facility];  Service: Cardiovascular;  Laterality: N/A;   RADIOLOGY WITH ANESTHESIA N/A 03/01/2016   Procedure: RADIOLOGY WITH ANESTHESIA;  Surgeon: [name], MD;  Location: [place];  Service: Radiology;  Laterality: N/A;   VEIN HARVEST Right 02/28/2016   Procedure: RIGHT GREATER SAPHENOUS VEIN HARVEST;  Surgeon: Conrad Moore, MD;  Location: [place];  Service: Vascular;  Laterality: Right;    There were no vitals filed for this visit.   Subjective Assessment - 06/05/21 1153     Subjective Patient spent the day in bed yesterday; she did get out of bed on Sunday. No falls or LOB since last session. Patient reports lower abdominal and back pain. Pt reports 10/10 pain however husband states she is doing well today.    Patient is accompained by: Family member    Pertinent History 22yoF presenting to [facility] to address recent gradual progressive decline in mobility, family goals for improved strength, to promote >  participation in transfers to wheelchair and safety with ADL performance. Recent illness includes admission to [facility] on 03/07/21-03/10/21 due to COVID infection c AMS and fever. Pt then underwent outpatient recanalization/limb salvage of RLE 04/10/2021; LLE salvage performed in June 2022. Other PMH: CHF, DM, HTN, CVA, COPD, multiple remote toe amputations, stg 2 sacral ulcer. PLOF includes 3-4 years bed bound status, hoyerlifts to WC, totalA for ADL performance. Pt is cared for by family, as well as professional caregiver services.    Limitations Sitting;Walking;Lifting;Standing;House hold activities    How long can you sit comfortably? Pt spouse reports 2 hours. Must be careful d/t preventing and trying to heal wounds on bottom.    How long can you stand comfortably? unable    How long can you walk comfortably? Unable    Diagnostic tests 6/21 and 9/06 peripheral vascular catheterizatoin; 04/10/2021 Successful recanalization right lower extremity for limb salvage    Patient Stated Goals to strengthen pt's trunk to improve mobility in chair, assist with  ADLs    Currently in Pain? Yes    Pain Score 10-Worst pain ever              INTERVENTIONS:        Seated in chair:  Forward/posterior weight shift with PT assisting at trunk/scapulae for upright posture, hold 3 seconds ; 10x; 1x 30 sec hold.    2lb ankle weights: -LAQ 10x each LE ,max cueing for sequencing ; x 2 sets  -hip flexion/march 10x each LE; tactile cueing for technique. Pt counted reps for increased participation.    Dynamic lateral leaning, alternating sides x 10 reps each direction to off load weight. Repetitive VC for technique. PT assisted pt maintain forward trunk lean during lateral weight shifting.    Ankle pf 10x each LE, very challenging for LLE   1lb dumbbells: -curls 10x each LE -alternating punches 10x each UE -abduction 10x    Rainbow ball: -toss with PT, for varying arc of motion and pertubations x 3  minutes -Lateral toss to PT  or rotational twist and weight shift x 10 reps -adduction squeeze 2x10     Pt educated throughout session about proper posture and technique with exercises. Improved exercise technique, movement at target joints, use of target muscles after min to mod verbal, visual, tactile cues.    Patient arrives to therapy with her husband accompanying her. She completed all therapeutic exercises; increased participation throughout session noted when pt counted her reps. Patient fatigues quickly throughout session frequently asking for rest breaks.  Patient will benefit from further skilled PT to increase strength, range of motion, mobility, and seated balance to increase ease and safety with ADLs           PT Short Term Goals - 05/11/21 1329       PT SHORT TERM GOAL #1   Title Patient will be independent in home exercise program to improve strength/mobility for better functional independence with ADLs.    Baseline 10/6: to be initiated    Time 6    Period Weeks    Status New    Target Date 06/21/21               PT Long Term Goals - 05/11/21 1330       PT LONG TERM GOAL #1   Title Patient will increase FOTO score to equal to or greater than 44 to demonstrate statistically significant improvement in mobility and quality of life.    Baseline 10/6: 26    Time 12    Period Weeks    Status New    Target Date 08/02/21      PT LONG TERM GOAL #2   Title Patient will roll L and R in bed with mod A for decreased caregiver burden for ADLs and toileting.    Baseline 10/6: per pt spouse, pt currently dependent    Time 12    Period Weeks    Status New    Target Date 08/02/21      PT LONG TERM GOAL #3   Title Patient will sit upright in WC, with BUE support and without leaning on backrest for 10 minutes for ADL performance and pressure relief.    Baseline 10/6: pt exhibits core contraction but unable to sit upright    Time 12    Period Weeks    Status New     Target Date 08/02/21      PT LONG TERM GOAL #4   Title Pt will demonstrate  ability to utilize BUEs to weight-shift to each side in WC and maintain weight shift for 30 seconds to assist in pressure relief and reduece risk of wound formation.    Baseline 10/6: pt currently unable    Time 12    Period Weeks    Status New    Target Date 08/02/21                   Plan - 06/05/21 1241     Clinical Impression Statement Patient arrives to therapy with her husband accompanying her. She completed all therapeutic exercises; increased participation throughout session noted when pt counted her reps. Patient fatigues quickly throughout session frequently asking for rest breaks.  Patient will benefit from further skilled PT to increase strength, range of motion, mobility, and seated balance to increase ease and safety with ADLs.    Personal Factors and Comorbidities Age;Time since onset of injury/illness/exacerbation;Comorbidity 3+;Past/Current Experience;Education;Social Background;Transportation;Sex;Fitness    Comorbidities diastolic congestive heart failure, diabetes mellitus, diabetic neuropathy, hypertension, hyperlipidemia, GERD, peripheral vascular disease, CVA. on 9/2 had amputation of L foot second toe MTP joint and LLE revascularization, recent hx of COVID19    Examination-Activity Limitations Bathing;Hygiene/Grooming;Squat;Bed Mobility;Stairs;Lift;Bend;Locomotion Level;Stand;Caring for [employer];Toileting;Carry;Self Feeding;Transfers;Continence;Dressing;Other;Sit    Examination-Participation Restrictions Cleaning;Shop;Community Activity;Meal Prep;Driving;Other    Stability/Clinical Decision Making Evolving/Moderate complexity    Rehab Potential Poor    PT Frequency 2x / week    PT Duration 12 weeks    PT Treatment/Interventions ADLs/Self Care Home Management;DME Instruction;Functional mobility training;Therapeutic activities;Therapeutic exercise;Neuromuscular  re-education;Patient/family education;Wheelchair mobility training;Passive range of motion;Energy conservation;Cryotherapy;Biofeedback;Electrical Stimulation;Iontophoresis 4mg /ml Dexamethasone;Moist Heat;Traction;Ultrasound;Stair training;Cognitive remediation;Balance training;Orthotic Fit/Training;Manual techniques;Manual lymph drainage;Splinting;Taping;Vasopneumatic Device;Vestibular;Visual/perceptual remediation/compensation;Gait training    PT Next Visit Plan bed mobility, pressure relief techniques;    PT Home Exercise Plan Previous- Seated lateral weight shift pressure relief technique in wheelchair to be performed 5 days/week for 2 sets of 10 repetitions each side.  PT provided patient with picture of technique as well.  05/21/2021- Access Code: [email or web address]    Consulted and Agree with Plan of Care Family member/caregiver    Family Member Consulted Spouse             Patient will benefit from skilled therapeutic intervention in order to improve the following deficits and impairments:  Decreased cognition, Decreased skin integrity, Impaired sensation, Decreased mobility, Postural dysfunction, Decreased activity tolerance, Decreased endurance, Decreased range of motion, Decreased strength, Decreased balance, Decreased safety awareness, Increased edema, Impaired flexibility, Decreased coordination, Cardiopulmonary status limiting activity, Abnormal gait, Impaired UE functional use, Impaired tone, Improper body mechanics, Pain, Difficulty walking, Decreased knowledge of use of DME, Hypomobility, Impaired perceived functional ability  Visit Diagnosis: Abnormality of gait and mobility  Other abnormalities of gait and mobility  Difficulty in walking, not elsewhere classified  Other lack of coordination  Muscle weakness (generalized)  Unsteadiness on feet     Problem List Patient Active Problem List   Diagnosis Date Noted   Atherosclerosis of native arteries of extremity with rest pain  ([place]) 04/20/2021   COVID-19 virus infection 03/07/2021   Acute febrile illness 03/07/2021   Hypoalbuminemia 03/07/2021   Hyperglycemia due to diabetes mellitus ([place]) 03/07/2021   Elevated troponin 03/07/2021   Constipation 03/07/2021   COPD (chronic obstructive pulmonary disease) ([place]) 10/20/2020   GERD (gastroesophageal reflux disease) 10/20/2020   Physical deconditioning 10/20/2020   Cellulitis 10/19/2020   Necrotic toes (HCC)    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)    Diabetic neuropathy (HCC)    Gangrene of  toe of both feet ([place]) 06/06/2019   Peripheral arterial disease ([place]) 04/27/2019   ESBL (extended spectrum beta-lactamase) producing bacteria infection 12/25/2018   PVD (peripheral vascular disease) ([place]) 09/12/2016   Ulcer of right lower extremity ([place]) 08/29/2016   Acute on chronic diastolic heart failure ([place]) 07/08/2016   HTN (hypertension) 07/05/2016   Sensorineural hearing loss (SNHL), 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   Chronic diastolic CHF (congestive heart failure) ([place]) 03/10/2016   HLD (hyperlipidemia) 03/09/2016   Acute respiratory acidosis ([place]) 03/05/2016   Anemia, iron deficiency 03/03/2016   Cerebrovascular accident (CVA) due to thrombosis of precerebral artery ([place]) 03/01/2016   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 ([place]) 03/01/2016   Atherosclerosis of native arteries of the extremities with gangrene ([place]) 12/08/2015   Anemia, chronic disease 04/27/2014   Unspecified hereditary and idiopathic peripheral neuropathy 03/14/2014   DM2 (diabetes mellitus, type 2) ([place]) 02/14/2014   Aortic stenosis 02/04/2014   Closed left subtrochanteric femur fracture ([place]) 02/03/2014   Hip fracture, left ([place]) 02/03/2014   Fibula fracture 02/03/2014   MTP instability 02/03/2014   Hip fracture ([place]) 02/03/2014    Patrina Levering PT, DPT  [facility] MAIN [facility] SERVICES [address] [place], [place],  [address] Phone: [phone]   Fax:  [phone]  Name: [name] MRN: [id number] Date of Birth: 1941/01/23

## 2021-06-07 ENCOUNTER — Ambulatory Visit: Payer: Medicare HMO

## 2021-06-08 ENCOUNTER — Ambulatory Visit: Payer: Medicare HMO | Admitting: Physical Therapy

## 2021-06-08 ENCOUNTER — Encounter: Payer: Self-pay | Admitting: Physical Therapy

## 2021-06-08 ENCOUNTER — Other Ambulatory Visit: Payer: Self-pay

## 2021-06-08 DIAGNOSIS — R262 Difficulty in walking, not elsewhere classified: Secondary | ICD-10-CM

## 2021-06-08 DIAGNOSIS — R278 Other lack of coordination: Secondary | ICD-10-CM | POA: Diagnosis present

## 2021-06-08 DIAGNOSIS — R293 Abnormal posture: Secondary | ICD-10-CM | POA: Diagnosis present

## 2021-06-08 DIAGNOSIS — M6281 Muscle weakness (generalized): Secondary | ICD-10-CM | POA: Diagnosis present

## 2021-06-08 DIAGNOSIS — R2689 Other abnormalities of gait and mobility: Secondary | ICD-10-CM | POA: Diagnosis present

## 2021-06-08 DIAGNOSIS — R2681 Unsteadiness on feet: Secondary | ICD-10-CM | POA: Diagnosis present

## 2021-06-08 DIAGNOSIS — R269 Unspecified abnormalities of gait and mobility: Secondary | ICD-10-CM | POA: Diagnosis not present

## 2021-06-08 NOTE — Therapy (Signed)
[place] MAIN [facility] SERVICES [address] [place], [place], [address] Phone: [phone]   Fax:  [phone]  Physical Therapy Treatment  Patient Details  Name: [name] MRN: [id number] Date of Birth: [birth year] Referring Provider (PT): Sharin Grave   Encounter Date: 06/08/2021   PT End of Session - 06/08/21 1123     Visit Number 9    Number of Visits 25    Date for PT Re-Evaluation 08/02/21    Authorization Type [employer]; Medicaid    Authorization Time Period 05/10/21-08/02/21    Progress Note Due on Visit 10    PT Start Time 0931    PT Stop Time 1015    PT Time Calculation (min) 44 min    Equipment Utilized During Treatment Gait belt    Activity Tolerance Patient tolerated treatment well;Patient limited by fatigue    Behavior During Therapy Flat affect;Impulsive             Past Medical History:  Diagnosis Date   Acute heart failure ([place])    Aortic stenosis    CHF (congestive heart failure) (HCC)    Complication of anesthesia    Hard to wake patient up after having anesthesia   Diabetes mellitus without complication ([place])    Diabetic neuropathy ([place])    Takes Lyrica   Edema of both legs    Takes Lasix   GERD (gastroesophageal reflux disease)    High cholesterol    HTN (hypertension)    Hypertension    PAD (peripheral artery disease) (HCC)    Shortness of breath dyspnea    Spasm of back muscles    Stroke (HCC)    Wound, open    Right great toe    Past Surgical History:  Procedure Laterality Date   AMPUTATION TOE Left 06/09/2019   Procedure: Left third toe and partial great toe amputation and debridement;  Surgeon: Katha Cabal, MD;  Location: [facility];  Service: Vascular;  Laterality: Left;   AMPUTATION TOE Left 04/06/2020   Procedure: AMPUTATION LEFT SECOND TOE;  Surgeon: Samara Deist, DPM;  Location: [facility];  Service: Podiatry;  Laterality: Left;   BYPASS GRAFT POPLITEAL TO TIBIAL Right  02/28/2016   Procedure: BYPASS GRAFT RIGHT BELOW KNEE POPLITEAL TO PERONEAL USING REVERSED RIGHT GREATER SAPHENOUS VEIN;  Surgeon: Conrad Minnehaha, MD;  Location: [place];  Service: Vascular;  Laterality: Right;   CYST EXCISION     Abdomen   EYE SURGERY Bilateral    Cataract removal   INTRAMEDULLARY (IM) NAIL INTERTROCHANTERIC Left 02/04/2014   Procedure: INTRAMEDULLARY (IM) NAIL INTERTROCHANTRIC FEMORAL;  Surgeon: Mauri Pole, MD;  Location: [place];  Service: Orthopedics;  Laterality: Left;   IR GENERIC HISTORICAL  03/01/2016   IR ANGIO INTRA EXTRACRAN SEL COM CAROTID INNOMINATE UNI R MOD SED 03/01/2016 [name], MD [facility]   IR GENERIC HISTORICAL  03/01/2016   IR ENDOVASC INTRACRANIAL INF OTHER THAN THROMBO ART INC DIAG ANGIO 03/01/2016 [name], MD [facility]   IR GENERIC HISTORICAL  03/01/2016   IR INTRAVSC STENT CERV CAROTID W/O EMB-PROT MOD SED INC ANGIO 03/01/2016 [name], MD [facility]   IR GENERIC HISTORICAL  06/03/2016   IR RADIOLOGIST EVAL & MGMT 06/03/2016 [facility]   LOWER EXTREMITY ANGIOGRAPHY Left 02/09/2019   Procedure: LOWER EXTREMITY ANGIOGRAPHY;  Surgeon: Katha Cabal, MD;  Location: [place] CV LAB;  Service: Cardiovascular;  Laterality: Left;   LOWER EXTREMITY ANGIOGRAPHY Left  03/10/2019   Procedure: LOWER EXTREMITY ANGIOGRAPHY;  Surgeon: Katha Cabal, MD;  Location: [place] [facility];  Service: Cardiovascular;  Laterality: Left;   LOWER EXTREMITY ANGIOGRAPHY Left 04/27/2019   Procedure: LOWER EXTREMITY ANGIOGRAPHY;  Surgeon: Katha Cabal, MD;  Location: [place] [facility];  Service: Cardiovascular;  Laterality: Left;   LOWER EXTREMITY ANGIOGRAPHY Left 06/08/2019   Procedure: Lower Extremity Angiography;  Surgeon: Katha Cabal, MD;  Location: [place] CV LAB;  Service: Cardiovascular;  Laterality: Left;   LOWER EXTREMITY ANGIOGRAPHY Left 04/03/2020   Procedure: Lower Extremity Angiography;  Surgeon: Algernon Huxley, MD;  Location: [place] [facility];  Service: Cardiovascular;  Laterality: Left;   LOWER EXTREMITY ANGIOGRAPHY Right 10/20/2020   Procedure: Lower Extremity Angiography;  Surgeon: Katha Cabal, MD;  Location: [place] [facility];  Service: Cardiovascular;  Laterality: Right;   LOWER EXTREMITY ANGIOGRAPHY Left 01/23/2021   Procedure: LOWER EXTREMITY ANGIOGRAPHY;  Surgeon: Katha Cabal, MD;  Location: [place] [facility] LAB;  Service: Cardiovascular;  Laterality: Left;   LOWER EXTREMITY ANGIOGRAPHY Right 04/10/2021   Procedure: LOWER EXTREMITY ANGIOGRAPHY;  Surgeon: Katha Cabal, MD;  Location: [place] [facility] LAB;  Service: Cardiovascular;  Laterality: Right;   ORIF TOE FRACTURE Right 02/08/2014   Procedure: OPEN REDUCTION INTERNAL FIXATION Right METATARSAL  FRACTURE ;  Surgeon: Wylene Simmer, MD;  Location: [place];  Service: Orthopedics;  Laterality: Right;   PERIPHERAL VASCULAR CATHETERIZATION N/A 12/28/2015   Procedure: Abdominal Aortogram w/Lower Extremity;  Surgeon: Conrad [place]il[place], MD;  Location: [place] [facility] LAB;  Service: Cardiovascular;  Laterality: N/A;   RADIOLOGY WITH ANESTHESIA N/A 03/01/2016   Procedure: RADIOLOGY WITH ANESTHESIA;  Surgeon: [name], MD;  Location: [place];  Service: Radiology;  Laterality: N/A;   VEIN HARVEST Right 02/28/2016   Procedure: RIGHT GREATER SAPHENOUS VEIN HARVEST;  Surgeon: Conrad [place], MD;  Location: [place];  Service: Vascular;  Laterality: Right;    There were no vitals filed for this visit.   Subjective Assessment - 06/08/21 0935     Subjective Patient states she is "alright" today. No falls or LOB since last session. Reports B knee pain that's "not too bad" however rates a 7/10.    Patient is accompained by: Family member    Pertinent History 70yoF presenting to [facility] to address recent gradual progressive decline in mobility, family goals for improved strength, to promote > participation in transfers to wheelchair and  safety with ADL performance. Recent illness includes admission to [facility] on 03/07/21-03/10/21 due to COVID infection c AMS and fever. Pt then underwent outpatient recanalization/limb salvage of RLE 04/10/2021; LLE salvage performed in June 2022. Other PMH: CHF, DM, HTN, CVA, COPD, multiple remote toe amputations, stg 2 sacral ulcer. PLOF includes 3-4 years bed bound status, hoyerlifts to WC, totalA for ADL performance. Pt is cared for by family, as well as professional caregiver services.    Limitations Sitting;Walking;Lifting;Standing;House hold activities    How long can you sit comfortably? Pt spouse reports 2 hours. Must be careful d/t preventing and trying to heal wounds on bottom.    How long can you stand comfortably? unable    How long can you walk comfortably? Unable    Diagnostic tests 6/21 and 9/06 peripheral vascular catheterizatoin; 04/10/2021 Successful recanalization right lower extremity for limb salvage    Patient Stated Goals to strengthen pt's trunk to improve mobility in chair, assist with ADLs    Currently in Pain? Yes  Pain Score 7     Pain Location Knee    Pain Orientation Right;Left    Pain Descriptors / Indicators Aching    Pain Type Chronic pain               INTERVENTIONS:        Seated in chair:  Forward/posterior weight shift with PT assisting at trunk/scapulae for upright posture, hold 3 seconds ; 10x; 1 x 10 second hold, 1 x 30 second hold - pt utilizing BUE support with holds.   Forward reach for target (PT hand) with trunk rotation; 2x10 BLE.    2lb ankle weights: -LAQ, 2x10 each LE ,cueing for sequencing ;  -hip flexion/march, 2x10 each LE; tactile cueing and AAROM for improved range.    1lb dumbbells: -bicep curls 10x each UE -shoulder abduction 2x10, tactile cueing for technique   Row with YTB, x10 reps.  STS from w/c; 3 reps; MOD-MAX A x2 for lift and B foot block. VC and TC for upright posture once standing with minimal return on feedback. Each  standing trial ~5 seconds.  Pt unable to scoot hips to edge of chair, Total A x2 to lift using Hoyer pad under pt.       Pt educated throughout session about proper posture and technique with exercises. Improved exercise technique, movement at target joints, use of target muscles after min to mod verbal, visual, tactile cues.      Patient arrives to therapy with her husband accompanying her; pt demo increased energy and agreeability throughout session. PT again asked pt to count reps today to improve participation. With MOD-MAX A x2, pt was bale to stand 3x - PT did provide encouragement for final stand. Pt reported pain in L foot with each stand. Pt asked for less rest breaks this session compared to last, asking "what's next" after completion of each exercise.  Patient will benefit from further skilled PT to increase strength, range of motion, mobility, and seated balance to increase ease and safety with ADLs            PT Short Term Goals - 05/11/21 1329       PT SHORT TERM GOAL #1   Title Patient will be independent in home exercise program to improve strength/mobility for better functional independence with ADLs.    Baseline 10/6: to be initiated    Time 6    Period Weeks    Status New    Target Date 06/21/21               PT Long Term Goals - 05/11/21 1330       PT LONG TERM GOAL #1   Title Patient will increase FOTO score to equal to or greater than 44 to demonstrate statistically significant improvement in mobility and quality of life.    Baseline 10/6: 26    Time 12    Period Weeks    Status New    Target Date 08/02/21      PT LONG TERM GOAL #2   Title Patient will roll L and R in bed with mod A for decreased caregiver burden for ADLs and toileting.    Baseline 10/6: per pt spouse, pt currently dependent    Time 12    Period Weeks    Status New    Target Date 08/02/21      PT LONG TERM GOAL #3   Title Patient will sit upright in WC, with BUE support  and without  leaning on backrest for 10 minutes for ADL performance and pressure relief.    Baseline 10/6: pt exhibits core contraction but unable to sit upright    Time 12    Period Weeks    Status New    Target Date 08/02/21      PT LONG TERM GOAL #4   Title Pt will demonstrate ability to utilize BUEs to weight-shift to each side in WC and maintain weight shift for 30 seconds to assist in pressure relief and reduece risk of wound formation.    Baseline 10/6: pt currently unable    Time 12    Period Weeks    Status New    Target Date 08/02/21                   Plan - 06/08/21 1124     Clinical Impression Statement Patient arrives to therapy with her husband accompanying her; pt demo increased energy and agreeability throughout session. PT again asked pt to count reps today to improve participation. With MOD-MAX A x2, pt was bale to stand 3x - PT did provide encouragement for final stand. Pt reported pain in L foot with each stand. Pt asked for less rest breaks this session compared to last, asking "what's next" after completion of each exercise.  Patient will benefit from further skilled PT to increase strength, range of motion, mobility, and seated balance to increase ease and safety with ADLs.    Personal Factors and Comorbidities Age;Time since onset of injury/illness/exacerbation;Comorbidity 3+;Past/Current Experience;Education;Social Background;Transportation;Sex;Fitness    Comorbidities diastolic congestive heart failure, diabetes mellitus, diabetic neuropathy, hypertension, hyperlipidemia, GERD, peripheral vascular disease, CVA. on 9/2 had amputation of L foot second toe MTP joint and LLE revascularization, recent hx of COVID19    Examination-Activity Limitations Bathing;Hygiene/Grooming;Squat;Bed Mobility;Stairs;Lift;Bend;Locomotion Level;Stand;Caring for [employer];Toileting;Carry;Self Feeding;Transfers;Continence;Dressing;Other;Sit    Examination-Participation  Restrictions Cleaning;Shop;Community Activity;Meal Prep;Driving;Other    Stability/Clinical Decision Making Evolving/Moderate complexity    Rehab Potential Poor    PT Frequency 2x / week    PT Duration 12 weeks    PT Treatment/Interventions ADLs/Self Care Home Management;DME Instruction;Functional mobility training;Therapeutic activities;Therapeutic exercise;Neuromuscular re-education;Patient/family education;Wheelchair mobility training;Passive range of motion;Energy conservation;Cryotherapy;Biofeedback;Electrical Stimulation;Iontophoresis 4mg /ml Dexamethasone;Moist Heat;Traction;Ultrasound;Stair training;Cognitive remediation;Balance training;Orthotic Fit/Training;Manual techniques;Manual lymph drainage;Splinting;Taping;Vasopneumatic Device;Vestibular;Visual/perceptual remediation/compensation;Gait training    PT Next Visit Plan bed mobility, pressure relief techniques;    PT Home Exercise Plan Previous- Seated lateral weight shift pressure relief technique in wheelchair to be performed 5 days/week for 2 sets of 10 repetitions each side.  PT provided patient with picture of technique as well.  05/21/2021- Access Code: [email or web address]    Consulted and Agree with Plan of Care Family member/caregiver    Family Member Consulted Spouse             Patient will benefit from skilled therapeutic intervention in order to improve the following deficits and impairments:  Decreased cognition, Decreased skin integrity, Impaired sensation, Decreased mobility, Postural dysfunction, Decreased activity tolerance, Decreased endurance, Decreased range of motion, Decreased strength, Decreased balance, Decreased safety awareness, Increased edema, Impaired flexibility, Decreased coordination, Cardiopulmonary status limiting activity, Abnormal gait, Impaired UE functional use, Impaired tone, Improper body mechanics, Pain, Difficulty walking, Decreased knowledge of use of DME, Hypomobility, Impaired perceived functional  ability  Visit Diagnosis: Abnormality of gait and mobility  Other abnormalities of gait and mobility  Difficulty in walking, not elsewhere classified  Other lack of coordination  Muscle weakness (generalized)  Unsteadiness on feet     Problem List Patient Active Problem List  Diagnosis Date Noted   Atherosclerosis of native arteries of extremity with rest pain ([place]) 04/20/2021   COVID-19 virus infection 03/07/2021   Acute febrile illness 03/07/2021   Hypoalbuminemia 03/07/2021   Hyperglycemia due to diabetes mellitus ([place]) 03/07/2021   Elevated troponin 03/07/2021   Constipation 03/07/2021   COPD (chronic obstructive pulmonary disease) ([place]) 10/20/2020   GERD (gastroesophageal reflux disease) 10/20/2020   Physical deconditioning 10/20/2020   Cellulitis 10/19/2020   Necrotic toes (HCC)    Insulin dependent diabetes mellitus type IA ([place])    Diabetic neuropathy (HCC)    Gangrene of toe of both feet ([place]) 06/06/2019   Peripheral arterial disease ([place]) 04/27/2019   ESBL (extended spectrum beta-lactamase) producing bacteria infection 12/25/2018   PVD (peripheral vascular disease) (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 ([place]) 03/01/2016   Atherosclerosis of native arteries of the extremities with gangrene ([place]) 12/08/2015   Anemia, chronic disease 04/27/2014   Unspecified hereditary and idiopathic peripheral neuropathy 03/14/2014   DM2 (diabetes mellitus, type 2)  ([place]) 02/14/2014   Aortic stenosis 02/04/2014   Closed left subtrochanteric femur fracture ([place]) 02/03/2014   Hip fracture, left ([place]) 02/03/2014   Fibula fracture 02/03/2014   MTP instability 02/03/2014   Hip fracture ([place]) 02/03/2014    Patrina Levering PT, DPT  [facility] MAIN [facility] SERVICES [address] [place], [place], [address] Phone: [phone]   Fax:  [phone]  Name: SHRIKA MILOS MRN: [id number] Date of Birth: [birth year]

## 2021-06-12 ENCOUNTER — Ambulatory Visit: Payer: Medicare HMO | Admitting: Physical Therapy

## 2021-06-12 ENCOUNTER — Other Ambulatory Visit: Payer: Self-pay | Admitting: Family Medicine

## 2021-06-12 ENCOUNTER — Other Ambulatory Visit: Payer: Self-pay

## 2021-06-12 ENCOUNTER — Encounter: Payer: Self-pay | Admitting: Physical Therapy

## 2021-06-12 DIAGNOSIS — R269 Unspecified abnormalities of gait and mobility: Secondary | ICD-10-CM | POA: Diagnosis not present

## 2021-06-12 DIAGNOSIS — R2689 Other abnormalities of gait and mobility: Secondary | ICD-10-CM

## 2021-06-12 DIAGNOSIS — R278 Other lack of coordination: Secondary | ICD-10-CM

## 2021-06-12 DIAGNOSIS — R2681 Unsteadiness on feet: Secondary | ICD-10-CM

## 2021-06-12 DIAGNOSIS — M549 Dorsalgia, unspecified: Secondary | ICD-10-CM

## 2021-06-12 DIAGNOSIS — R293 Abnormal posture: Secondary | ICD-10-CM

## 2021-06-12 DIAGNOSIS — M6281 Muscle weakness (generalized): Secondary | ICD-10-CM

## 2021-06-12 DIAGNOSIS — R262 Difficulty in walking, not elsewhere classified: Secondary | ICD-10-CM

## 2021-06-13 NOTE — Therapy (Signed)
[place] MAIN [facility] SERVICES [address] [place], [place], [address] Phone: [phone]   Fax:  [phone]  Physical Therapy Treatment Physical Therapy Progress Note   Dates of reporting period  05/10/21   to   06/13/21   Patient Details  Name: [name] MRN: [id number] Date of Birth: 1941-06-23 Referring Provider (PT): Sharin Grave   Encounter Date: 06/12/2021   PT End of Session - 06/12/21 1348     Visit Number 10    Number of Visits 25    Date for PT Re-Evaluation 08/02/21    Authorization Type [employer]; Medicaid    Authorization Time Period 05/10/21-08/02/21    Progress Note Due on Visit 10    PT Start Time 1348    PT Stop Time 1430    PT Time Calculation (min) 42 min    Equipment Utilized During Treatment Gait belt    Activity Tolerance Patient tolerated treatment well;Patient limited by fatigue    Behavior During Therapy Flat affect;Impulsive             Past Medical History:  Diagnosis Date   Acute heart failure ([place])    Aortic stenosis    CHF (congestive heart failure) (HCC)    Complication of anesthesia    Hard to wake patient up after having anesthesia   Diabetes mellitus without complication ([place])    Diabetic neuropathy ([place])    Takes Lyrica   Edema of both legs    Takes Lasix   GERD (gastroesophageal reflux disease)    High cholesterol    HTN (hypertension)    Hypertension    PAD (peripheral artery disease) (HCC)    Shortness of breath dyspnea    Spasm of back muscles    Stroke (HCC)    Wound, open    Right great toe    Past Surgical History:  Procedure Laterality Date   AMPUTATION TOE Left 06/09/2019   Procedure: Left third toe and partial great toe amputation and debridement;  Surgeon: Katha Cabal, MD;  Location: [facility] ORS;  Service: Vascular;  Laterality: Left;   AMPUTATION TOE Left 04/06/2020   Procedure: AMPUTATION LEFT SECOND TOE;  Surgeon: Samara Deist, DPM;  Location: [facility]  ORS;  Service: Podiatry;  Laterality: Left;   BYPASS GRAFT POPLITEAL TO TIBIAL Right 02/28/2016   Procedure: BYPASS GRAFT RIGHT BELOW KNEE POPLITEAL TO PERONEAL USING REVERSED RIGHT GREATER SAPHENOUS VEIN;  Surgeon: Conrad Safford, MD;  Location: [place];  Service: Vascular;  Laterality: Right;   CYST EXCISION     Abdomen   EYE SURGERY Bilateral    Cataract removal   INTRAMEDULLARY (IM) NAIL INTERTROCHANTERIC Left 02/04/2014   Procedure: INTRAMEDULLARY (IM) NAIL INTERTROCHANTRIC FEMORAL;  Surgeon: Mauri Pole, MD;  Location: [place];  Service: Orthopedics;  Laterality: Left;   IR GENERIC HISTORICAL  03/01/2016   IR ANGIO INTRA EXTRACRAN SEL COM CAROTID INNOMINATE UNI R MOD SED 03/01/2016 [name], MD [facility]   IR GENERIC HISTORICAL  03/01/2016   IR ENDOVASC INTRACRANIAL INF OTHER THAN THROMBO ART INC DIAG ANGIO 03/01/2016 [name], MD [facility]   IR GENERIC HISTORICAL  03/01/2016   IR INTRAVSC STENT CERV CAROTID W/O EMB-PROT MOD SED INC ANGIO 03/01/2016 [name], MD [facility]   IR GENERIC HISTORICAL  06/03/2016   IR RADIOLOGIST EVAL & MGMT 06/03/2016 [facility]   LOWER EXTREMITY ANGIOGRAPHY Left 02/09/2019   Procedure: LOWER EXTREMITY ANGIOGRAPHY;  Surgeon: Katha Cabal,  MD;  Location: [place] CV LAB;  Service: Cardiovascular;  Laterality: Left;   LOWER EXTREMITY ANGIOGRAPHY Left 03/10/2019   Procedure: LOWER EXTREMITY ANGIOGRAPHY;  Surgeon: Katha Cabal, MD;  Location: [place] CV LAB;  Service: Cardiovascular;  Laterality: Left;   LOWER EXTREMITY ANGIOGRAPHY Left 04/27/2019   Procedure: LOWER EXTREMITY ANGIOGRAPHY;  Surgeon: Katha Cabal, MD;  Location: [place] CV LAB;  Service: Cardiovascular;  Laterality: Left;   LOWER EXTREMITY ANGIOGRAPHY Left 06/08/2019   Procedure: Lower Extremity Angiography;  Surgeon: Katha Cabal, MD;  Location: [place] CV LAB;  Service: Cardiovascular;  Laterality: Left;   LOWER EXTREMITY  ANGIOGRAPHY Left 04/03/2020   Procedure: Lower Extremity Angiography;  Surgeon: Algernon Huxley, MD;  Location: [place] CV LAB;  Service: Cardiovascular;  Laterality: Left;   LOWER EXTREMITY ANGIOGRAPHY Right 10/20/2020   Procedure: Lower Extremity Angiography;  Surgeon: Katha Cabal, MD;  Location: [place] CV LAB;  Service: Cardiovascular;  Laterality: Right;   LOWER EXTREMITY ANGIOGRAPHY Left 01/23/2021   Procedure: LOWER EXTREMITY ANGIOGRAPHY;  Surgeon: Katha Cabal, MD;  Location: [place] CV LAB;  Service: Cardiovascular;  Laterality: Left;   LOWER EXTREMITY ANGIOGRAPHY Right 04/10/2021   Procedure: LOWER EXTREMITY ANGIOGRAPHY;  Surgeon: Katha Cabal, MD;  Location: [place] CV LAB;  Service: Cardiovascular;  Laterality: Right;   ORIF TOE FRACTURE Right 02/08/2014   Procedure: OPEN REDUCTION INTERNAL FIXATION Right METATARSAL  FRACTURE ;  Surgeon: Wylene Simmer, MD;  Location: [place];  Service: Orthopedics;  Laterality: Right;   PERIPHERAL VASCULAR CATHETERIZATION N/A 12/28/2015   Procedure: Abdominal Aortogram w/Lower Extremity;  Surgeon: Conrad [place], MD;  Location: [place] CV LAB;  Service: Cardiovascular;  Laterality: N/A;   RADIOLOGY WITH ANESTHESIA N/A 03/01/2016   Procedure: RADIOLOGY WITH ANESTHESIA;  Surgeon: [name], MD;  Location: [place];  Service: Radiology;  Laterality: N/A;   VEIN HARVEST Right 02/28/2016   Procedure: RIGHT GREATER SAPHENOUS VEIN HARVEST;  Surgeon: Conrad [place], MD;  Location: [place];  Service: Vascular;  Laterality: Right;    There were no vitals filed for this visit.   Subjective Assessment - 06/12/21 1354     Subjective Patient reports stating, "I don't know" after being asked how she is doing. They are still using hoyer lift for transfers. Caregiver reports patient is doing exercise 3x a week. Denies any new falls.    Patient is accompained by: Family member    Pertinent History 73yoF presenting to [place] to  address recent gradual progressive decline in mobility, family goals for improved strength, to promote > participation in transfers to wheelchair and safety with ADL performance. Recent illness includes admission to [facility] on 03/07/21-03/10/21 due to COVID infection c AMS and fever. Pt then underwent outpatient recanalization/limb salvage of RLE 04/10/2021; LLE salvage performed in June 2022. Other PMH: CHF, DM, HTN, CVA, COPD, multiple remote toe amputations, stg 2 sacral ulcer. PLOF includes 3-4 years bed bound status, hoyerlifts to WC, totalA for ADL performance. Pt is cared for by family, as well as professional caregiver services.    Limitations Sitting;Walking;Lifting;Standing;House hold activities    How long can you sit comfortably? Pt spouse reports 2 hours. Must be careful d/t preventing and trying to heal wounds on bottom.    How long can you stand comfortably? unable    How long can you walk comfortably? Unable    Diagnostic tests 6/21 and 9/06 peripheral vascular catheterizatoin; 04/10/2021 Successful recanalization right lower extremity  for limb salvage    Patient Stated Goals to strengthen pt's trunk to improve mobility in chair, assist with ADLs    Currently in Pain? Yes    Pain Score 5     Pain Location Foot    Pain Orientation Right;Left    Pain Descriptors / Indicators Aching;Sore    Pain Type Chronic pain    Pain Onset More than a month ago    Pain Frequency Intermittent    Aggravating Factors  worse with standing/weight bearing    Pain Relieving Factors rest    Effect of Pain on Daily Activities unable to state;    Multiple Pain Sites No              TREATMENT:  Instructed patient in FOTO, 12% which is a decline from initial eval. However after investigating, unsure of accuracy at eval as patient reported minimal difficulty with toileting, but upon questioning, husband reports he does all her toileting and cleaning as patient is bed bound and incontinent.  PT assessed  patient's mobility at home. Her husband reports she is rolling a little better in bed.  Bed mobility not assessed this visit as lift was not available  Caregiver reports patient spends all of her time in bed unless she is leaving the house. He is unable to get her in the wheelchair by himself with hoyer lift. He reports caregivers don't feel comfortable using the lift. His son is the only one who will use the lift to transfer her from bed to wheelchair PT will look into to seeing what can be done for caregiver education to improve mobility with lift as patient would progress quicker and do better with getting out of bed more often.  Currently, patient exhibits a heavy posterior lean likely from chronic bed bound status and poor trunk control  Instructed patient in seated forward weight shift working towards sitting unsupported away from back of chair x5 reps Patient able to hold unsupported sitting with arms on arm rest for 10 sec with erect posture. She often falls back against back of chair due to fatigue;  Instructed patient in sit<>Stand with standard walker Required mod A +1 for scooting forward in wheelchair Transferred sit<>Stand with walker, mod A +2 with cues for hand placement, increase forward lean Upon standing, patient exhibits increased hip flexion with heavy posterior lean; She required cues for weight shift forward for increased weight bearing in BLE feet and to increase gluteal activation for increased hip extension; Patient reports increased back/foot pain with standing. She was able to hold standing position for approximately 10-15 sec Completed 2 repetitions  Patient required max A +2 for scooting back in wheelchair and repositioning;  Patient tolerated session fair. She reports no pain once seated in chair Patient's condition has the potential to improve in response to therapy. Maximum improvement is yet to be obtained. The anticipated improvement is attainable and  reasonable in a generally predictable time.  Patient reports working on exercise at home. She is unable to get up in wheelchair as caregiver is unable to operate hoyer lift, caregivers/aides don't feel comfortable using lift; the only one who will get patient out of bed is her son and he works daily. PT will work on seeing what we can do to improve caregiver training/education to increase patient's mobility at home, as getting up in chair will greatly improve mobility and trunk control;  PT Education - 06/13/21 0924     Education Details positioning, transfers;    Person(s) Educated Patient    Methods Explanation;Verbal cues    Comprehension Returned demonstration;Verbal cues required;Verbalized understanding;Need further instruction              PT Short Term Goals - 06/12/21 1358       PT SHORT TERM GOAL #1   Title Patient will be independent in home exercise program to improve strength/mobility for better functional independence with ADLs.    Baseline 10/6: to be initiated, 11/8: caregiver reports she is doing them 3x a day;    Time 6    Period Weeks    Status Partially Met    Target Date 06/21/21               PT Long Term Goals - 06/12/21 1402       PT LONG TERM GOAL #1   Title Patient will increase FOTO score to equal to or greater than 44 to demonstrate statistically significant improvement in mobility and quality of life.    Baseline 10/6: 26, 11/8: 12%    Time 12    Period Weeks    Status Not Met    Target Date 08/02/21      PT LONG TERM GOAL #2   Title Patient will roll L and R in bed with mod A for decreased caregiver burden for ADLs and toileting.    Baseline 10/6: per pt spouse, pt currently dependent, 11/8: mod A, inconsistent    Time 12    Period Weeks    Status Partially Met    Target Date 08/02/21      PT LONG TERM GOAL #3   Title Patient will sit upright in WC, with BUE support and without leaning  on backrest for 10 minutes for ADL performance and pressure relief.    Baseline 10/6: pt exhibits core contraction but unable to sit upright, 11/8: able to sit upright for <20 sec with mod VCs for erect positioning    Time 12    Period Weeks    Status Partially Met    Target Date 08/02/21      PT LONG TERM GOAL #4   Title Pt will demonstrate ability to utilize BUEs to weight-shift to each side in WC and maintain weight shift for 30 seconds to assist in pressure relief and reduece risk of wound formation.    Baseline 10/6: pt currently unable, 11/8: unable to initiate weight shift, able to use UE but unable to shift hips without assistance;    Time 12    Period Weeks    Status Partially Met    Target Date 08/02/21                   Plan - 06/13/21 1962     Clinical Impression Statement Patient motivated and participated well within session. Patient is having difficulty getting in the wheelchair with hoyer lift as her husband is unable to operate the lift. They have home aides that come 5 x a week but none of the aides feel comfortable use the lift. Her son is able to use the lift to get her in the wheelchair but he works daily and can only come over a few times a week. Patient exhibits a heavy posterior lean in sitting and standing likely from prolonged bed bound positioning. She would progress better with improved trunk control if sitting up more. PT will follow up with  home aides to see what training needs to occur to improve patient safety and mobility with getting out of bed more often. With repeated seated position this session, she was able to sit unsupported in chair for approximately 10 sec with good positioning. Patient was able to stand sit<>Stand with mod A +2 with RW for short time reporting increased back and foot pain with weight bearing. Patient has difficulty achieving erect posture due to weakness in gluteal muscles and decreased weight shift. Patient would benefit from  additional skilled PT Intervention to improve strength, balance and mobility;    Personal Factors and Comorbidities Age;Time since onset of injury/illness/exacerbation;Comorbidity 3+;Past/Current Experience;Education;Social Background;Transportation;Sex;Fitness    Comorbidities diastolic congestive heart failure, diabetes mellitus, diabetic neuropathy, hypertension, hyperlipidemia, GERD, peripheral vascular disease, CVA. on 9/2 had amputation of L foot second toe MTP joint and LLE revascularization, recent hx of COVID19    Examination-Activity Limitations Bathing;Hygiene/Grooming;Squat;Bed Mobility;Stairs;Lift;Bend;Locomotion Level;Stand;Caring for [employer];Toileting;Carry;Self Feeding;Transfers;Continence;Dressing;Other;Sit    Examination-Participation Restrictions Cleaning;Shop;Community Activity;Meal Prep;Driving;Other    Stability/Clinical Decision Making Evolving/Moderate complexity    Rehab Potential Poor    PT Frequency 2x / week    PT Duration 12 weeks    PT Treatment/Interventions ADLs/Self Care Home Management;DME Instruction;Functional mobility training;Therapeutic activities;Therapeutic exercise;Neuromuscular re-education;Patient/family education;Wheelchair mobility training;Passive range of motion;Energy conservation;Cryotherapy;Biofeedback;Electrical Stimulation;Iontophoresis 75m/ml Dexamethasone;Moist Heat;Traction;Ultrasound;Stair training;Cognitive remediation;Balance training;Orthotic Fit/Training;Manual techniques;Manual lymph drainage;Splinting;Taping;Vasopneumatic Device;Vestibular;Visual/perceptual remediation/compensation;Gait training    PT Next Visit Plan bed mobility, pressure relief techniques;    PT Home Exercise Plan Previous- Seated lateral weight shift pressure relief technique in wheelchair to be performed 5 days/week for 2 sets of 10 repetitions each side.  PT provided patient with picture of technique as well.  05/21/2021- Access Code: [email or web address]   Consulted  and Agree with Plan of Care Family member/caregiver    Family Member Consulted Spouse             Patient will benefit from skilled therapeutic intervention in order to improve the following deficits and impairments:  Decreased cognition, Decreased skin integrity, Impaired sensation, Decreased mobility, Postural dysfunction, Decreased activity tolerance, Decreased endurance, Decreased range of motion, Decreased strength, Decreased balance, Decreased safety awareness, Increased edema, Impaired flexibility, Decreased coordination, Cardiopulmonary status limiting activity, Abnormal gait, Impaired UE functional use, Impaired tone, Improper body mechanics, Pain, Difficulty walking, Decreased knowledge of use of DME, Hypomobility, Impaired perceived functional ability  Visit Diagnosis: Other abnormalities of gait and mobility  Difficulty in walking, not elsewhere classified  Other lack of coordination  Muscle weakness (generalized)  Unsteadiness on feet  Abnormal posture     Problem List Patient Active Problem List   Diagnosis Date Noted   Atherosclerosis of native arteries of extremity with rest pain ([place] 04/20/2021   COVID-19 virus infection 03/07/2021   Acute febrile illness 03/07/2021   Hypoalbuminemia 03/07/2021   Hyperglycemia due to diabetes mellitus ([place] 03/07/2021   Elevated troponin 03/07/2021   Constipation 03/07/2021   COPD (chronic obstructive pulmonary disease) ([place] 10/20/2020   GERD (gastroesophageal reflux disease) 10/20/2020   Physical deconditioning 10/20/2020   Cellulitis 10/19/2020   Necrotic toes (HCC)    Insulin dependent diabetes mellitus type IA ([facility]    Diabetic neuropathy (HCC)    Gangrene of toe of both feet ([facility] 06/06/2019   Peripheral arterial disease ([place] 04/27/2019   ESBL (extended spectrum beta-lactamase) producing bacteria infection 12/25/2018   PVD (peripheral vascular disease) ([facility] 09/12/2016   Ulcer of right lower extremity ([facility] [facility]  08/29/2016   Acute on chronic diastolic heart failure ([facility] 07/08/2016  HTN (hypertension) 07/05/2016   Sensorineural hearing loss (SNHL), 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   Chronic diastolic CHF (congestive heart failure) ([place]) 03/10/2016   HLD (hyperlipidemia) 03/09/2016   Acute respiratory acidosis ([place]) 03/05/2016   Anemia, iron deficiency 03/03/2016   Cerebrovascular accident (CVA) due to thrombosis of precerebral artery ([place]) 03/01/2016   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 MAIN [facility] SERVICES [place], [place], [address] Phone: [phone]   Fax:  [phone]  Name: [name] MRN: [id number] Date of Birth: 04-Jun-1941

## 2021-06-14 ENCOUNTER — Ambulatory Visit: Payer: Medicare HMO | Admitting: Physical Therapy

## 2021-06-14 ENCOUNTER — Encounter: Payer: Self-pay | Admitting: Physical Therapy

## 2021-06-14 ENCOUNTER — Other Ambulatory Visit: Payer: Self-pay

## 2021-06-14 DIAGNOSIS — R278 Other lack of coordination: Secondary | ICD-10-CM

## 2021-06-14 DIAGNOSIS — R269 Unspecified abnormalities of gait and mobility: Secondary | ICD-10-CM | POA: Diagnosis not present

## 2021-06-14 DIAGNOSIS — R262 Difficulty in walking, not elsewhere classified: Secondary | ICD-10-CM

## 2021-06-14 DIAGNOSIS — M6281 Muscle weakness (generalized): Secondary | ICD-10-CM

## 2021-06-14 DIAGNOSIS — R2689 Other abnormalities of gait and mobility: Secondary | ICD-10-CM

## 2021-06-14 DIAGNOSIS — R2681 Unsteadiness on feet: Secondary | ICD-10-CM

## 2021-06-14 NOTE — Therapy (Signed)
[place] MAIN [facility] SERVICES [address] [place], [place], [address] Phone: [phone]   Fax:  [phone]  Physical Therapy Treatment  Patient Details  Name: [name] MRN: [id number] Date of Birth: [birth year] Referring Provider (PT): Sharin Grave   Encounter Date: 06/14/2021   PT End of Session - 06/14/21 1239     Visit Number 11    Number of Visits 25    Date for PT Re-Evaluation 08/02/21    Authorization Type [employer]; Medicaid    Authorization Time Period 05/10/21-08/02/21    Progress Note Due on Visit 10    PT Start Time 1145    PT Stop Time 1224    PT Time Calculation (min) 39 min    Equipment Utilized During Treatment Gait belt    Activity Tolerance Patient tolerated treatment well;Patient limited by fatigue    Behavior During Therapy Flat affect;Impulsive             Past Medical History:  Diagnosis Date   Acute heart failure ([place])    Aortic stenosis    CHF (congestive heart failure) (HCC)    Complication of anesthesia    Hard to wake patient up after having anesthesia   Diabetes mellitus without complication ([place])    Diabetic neuropathy ([place])    Takes Lyrica   Edema of both legs    Takes Lasix   GERD (gastroesophageal reflux disease)    High cholesterol    HTN (hypertension)    Hypertension    PAD (peripheral artery disease) (HCC)    Shortness of breath dyspnea    Spasm of back muscles    Stroke (HCC)    Wound, open    Right great toe    Past Surgical History:  Procedure Laterality Date   AMPUTATION TOE Left 06/09/2019   Procedure: Left third toe and partial great toe amputation and debridement;  Surgeon: Katha Cabal, MD;  Location: [facility];  Service: Vascular;  Laterality: Left;   AMPUTATION TOE Left 04/06/2020   Procedure: AMPUTATION LEFT SECOND TOE;  Surgeon: Samara Deist, DPM;  Location: [facility];  Service: Podiatry;  Laterality: Left;   BYPASS GRAFT POPLITEAL TO TIBIAL Right  02/28/2016   Procedure: BYPASS GRAFT RIGHT BELOW KNEE POPLITEAL TO PERONEAL USING REVERSED RIGHT GREATER SAPHENOUS VEIN;  Surgeon: Conrad Shelton, MD;  Location: [place];  Service: Vascular;  Laterality: Right;   CYST EXCISION     Abdomen   EYE SURGERY Bilateral    Cataract removal   INTRAMEDULLARY (IM) NAIL INTERTROCHANTERIC Left 02/04/2014   Procedure: INTRAMEDULLARY (IM) NAIL INTERTROCHANTRIC FEMORAL;  Surgeon: Mauri Pole, MD;  Location: [place];  Service: Orthopedics;  Laterality: Left;   IR GENERIC HISTORICAL  03/01/2016   IR ANGIO INTRA EXTRACRAN SEL COM CAROTID INNOMINATE UNI R MOD SED 03/01/2016 [name], MD [facility]   IR GENERIC HISTORICAL  03/01/2016   IR ENDOVASC INTRACRANIAL INF OTHER THAN THROMBO ART INC DIAG ANGIO 03/01/2016 [name], MD [facility]   IR GENERIC HISTORICAL  03/01/2016   IR INTRAVSC STENT CERV CAROTID W/O EMB-PROT MOD SED INC ANGIO 03/01/2016 [name], MD [facility]   IR GENERIC HISTORICAL  06/03/2016   IR RADIOLOGIST EVAL & MGMT 06/03/2016 [facility]   LOWER EXTREMITY ANGIOGRAPHY Left 02/09/2019   Procedure: LOWER EXTREMITY ANGIOGRAPHY;  Surgeon: Katha Cabal, MD;  Location: [place] [facility];  Service: Cardiovascular;  Laterality: Left;   LOWER EXTREMITY ANGIOGRAPHY Left  03/10/2019   Procedure: LOWER EXTREMITY ANGIOGRAPHY;  Surgeon: Katha Cabal, MD;  Location: [place] [facility];  Service: Cardiovascular;  Laterality: Left;   LOWER EXTREMITY ANGIOGRAPHY Left 04/27/2019   Procedure: LOWER EXTREMITY ANGIOGRAPHY;  Surgeon: Katha Cabal, MD;  Location: [place] [facility];  Service: Cardiovascular;  Laterality: Left;   LOWER EXTREMITY ANGIOGRAPHY Left 06/08/2019   Procedure: Lower Extremity Angiography;  Surgeon: Katha Cabal, MD;  Location: [place] [facility] LAB;  Service: Cardiovascular;  Laterality: Left;   LOWER EXTREMITY ANGIOGRAPHY Left 04/03/2020   Procedure: Lower Extremity Angiography;  Surgeon: Algernon Huxley, MD;  Location: [place] [facility] LAB;  Service: Cardiovascular;  Laterality: Left;   LOWER EXTREMITY ANGIOGRAPHY Right 10/20/2020   Procedure: Lower Extremity Angiography;  Surgeon: Katha Cabal, MD;  Location: [place] [facility];  Service: Cardiovascular;  Laterality: Right;   LOWER EXTREMITY ANGIOGRAPHY Left 01/23/2021   Procedure: LOWER EXTREMITY ANGIOGRAPHY;  Surgeon: Katha Cabal, MD;  Location: [place] [facility];  Service: Cardiovascular;  Laterality: Left;   LOWER EXTREMITY ANGIOGRAPHY Right 04/10/2021   Procedure: LOWER EXTREMITY ANGIOGRAPHY;  Surgeon: Katha Cabal, MD;  Location: [place] [facility];  Service: Cardiovascular;  Laterality: Right;   ORIF TOE FRACTURE Right 02/08/2014   Procedure: OPEN REDUCTION INTERNAL FIXATION Right METATARSAL  FRACTURE ;  Surgeon: Wylene Simmer, MD;  Location: [place];  Service: Orthopedics;  Laterality: Right;   PERIPHERAL VASCULAR CATHETERIZATION N/A 12/28/2015   Procedure: Abdominal Aortogram w/Lower Extremity;  Surgeon: Conrad Playa Fortuna, MD;  Location: [place] [facility] LAB;  Service: Cardiovascular;  Laterality: N/A;   RADIOLOGY WITH ANESTHESIA N/A 03/01/2016   Procedure: RADIOLOGY WITH ANESTHESIA;  Surgeon: [name], MD;  Location: [place];  Service: Radiology;  Laterality: N/A;   VEIN HARVEST Right 02/28/2016   Procedure: RIGHT GREATER SAPHENOUS VEIN HARVEST;  Surgeon: Conrad Marfa, MD;  Location: [place];  Service: Vascular;  Laterality: Right;    There were no vitals filed for this visit.   Subjective Assessment - 06/14/21 1237     Subjective In pt waiting room, pt states she is ready to exercise. While walking back to the gym, pt states she has pain in her buttocks. Husband states pt was excited to come to therapy today.    Patient is accompained by: Family member    Pertinent History 78yoF presenting to [facility] to address recent gradual progressive decline in mobility, family goals for improved strength, to promote >  participation in transfers to wheelchair and safety with ADL performance. Recent illness includes admission to [facility] on 03/07/21-03/10/21 due to COVID infection c AMS and fever. Pt then underwent outpatient recanalization/limb salvage of RLE 04/10/2021; LLE salvage performed in June 2022. Other PMH: CHF, DM, HTN, CVA, COPD, multiple remote toe amputations, stg 2 sacral ulcer. PLOF includes 3-4 years bed bound status, hoyerlifts to WC, totalA for ADL performance. Pt is cared for by family, as well as professional caregiver services.    Limitations Sitting;Walking;Lifting;Standing;House hold activities    How long can you sit comfortably? Pt spouse reports 2 hours. Must be careful d/t preventing and trying to heal wounds on bottom.    How long can you stand comfortably? unable    How long can you walk comfortably? Unable    Diagnostic tests 6/21 and 9/06 peripheral vascular catheterizatoin; 04/10/2021 Successful recanalization right lower extremity for limb salvage    Patient Stated Goals to strengthen pt's trunk to improve mobility in chair,  assist with ADLs    Currently in Pain? Yes    Pain Location Buttocks    Pain Orientation Right;Left    Pain Descriptors / Indicators Aching;Sore    Pain Onset More than a month ago                INTERVENTIONS:    *today's session was limited by pain. Initially in buttocks with eventual reports of pain in B feet, arms, mid-back, low back, upper thighs with different movements*  STS from w/c; 2 reps; MAX A x2 for lift and B foot block. VC and TC for upright posture once standing with minimal return on feedback. Each standing trial ~5 seconds.  Pt unable to scoot hips to edge of chair, Total A x2 to lift using Hoyer pad under pt.     Seated in chair:  Forward trunk lean x8 throughout session to don/doff jacket and for each stand.    -LAQ, 2x10 each LE ,cueing for sequencing ;  -hip flexion/march, x10 each LE; tactile cueing and AAROM for improved range.   -bicep curls 10x each UE, YTB -row with YTB, x10 reps.    -seated hamstring stretch, 2x30 seconds each LE -seated calf stretch, 2x30 seconds each LE -seated chest stretch, one side at a time, 30 seconds each.  PT adjusted both seat belts on w/c for safety and foot rests for pt comfort.   Pt readjusted x5 occasions with Total Ax2 in wheelchair due to continued report of pain in buttocks. Seat cushion in w/c readjusted by 3rd person as other 2 people provided total assist to lift.    Pt educated throughout session about proper posture and technique with exercises. Improved exercise technique, movement at target joints, use of target muscles after min to mod verbal, visual, tactile cues.       Patient arrives to therapy with her husband accompanying her; pt reporting pain throughout session and not as agreeable to exercises as she was in past sessions. PT noticed seat cushion was displaced to the right. Session began with STS to correct cushion followed by another STS for improved hip positioning in chair. Boosting with total a x2 still required. Boosting provided multiple times as pt seemed to be sliding out of chair today; cushion corrected an additional time later in session. Pt reported pain with each therapeutic exercise including stretching (pain reported in areas not being stretched). PT educated husband that today may not be a good day for therapy. Also educated husband that he will need to place hoyer pad again before transferring due to it shifting. PT assisted with minor w/c adjustments. Patient will benefit from further skilled PT to increase strength, range of motion, mobility, and seated balance to increase ease and safety with ADLs            PT Short Term Goals - 06/12/21 1358       PT SHORT TERM GOAL #1   Title Patient will be independent in home exercise program to improve strength/mobility for better functional independence with ADLs.    Baseline 10/6: to be  initiated, 11/8: caregiver reports she is doing them 3x a day;    Time 6    Period Weeks    Status Partially Met    Target Date 06/21/21               PT Long Term Goals - 06/12/21 1402       PT LONG TERM GOAL #1   Title Patient will  increase FOTO score to equal to or greater than 44 to demonstrate statistically significant improvement in mobility and quality of life.    Baseline 10/6: 26, 11/8: 12%    Time 12    Period Weeks    Status Not Met    Target Date 08/02/21      PT LONG TERM GOAL #2   Title Patient will roll L and R in bed with mod A for decreased caregiver burden for ADLs and toileting.    Baseline 10/6: per pt spouse, pt currently dependent, 11/8: mod A, inconsistent    Time 12    Period Weeks    Status Partially Met    Target Date 08/02/21      PT LONG TERM GOAL #3   Title Patient will sit upright in WC, with BUE support and without leaning on backrest for 10 minutes for ADL performance and pressure relief.    Baseline 10/6: pt exhibits core contraction but unable to sit upright, 11/8: able to sit upright for <20 sec with mod VCs for erect positioning    Time 12    Period Weeks    Status Partially Met    Target Date 08/02/21      PT LONG TERM GOAL #4   Title Pt will demonstrate ability to utilize BUEs to weight-shift to each side in WC and maintain weight shift for 30 seconds to assist in pressure relief and reduece risk of wound formation.    Baseline 10/6: pt currently unable, 11/8: unable to initiate weight shift, able to use UE but unable to shift hips without assistance;    Time 12    Period Weeks    Status Partially Met    Target Date 08/02/21                   Plan - 06/14/21 1239     Clinical Impression Statement Patient arrives to therapy with her husband accompanying her; pt reporting pain throughout session and not as agreeable to exercises as she was in past sessions. PT noticed seat cushion was displaced to the right. Session  began with STS to correct cushion followed by another STS for improved hip positioning in chair. Boosting with total a x2 still required. Boosting provided multiple times as pt seemed to be sliding out of chair today; cushion corrected an additional time later in session. Pt reported pain with each therapeutic exercise including stretching (pain reported in areas not being stretched). PT educated husband that today may not be a good day for therapy. Also educated husband that he will need to place hoyer pad again before transferring due to it shifting. PT assisted with minor w/c adjustments. Patient will benefit from further skilled PT to increase strength, range of motion, mobility, and seated balance to increase ease and safety with ADLs    Personal Factors and Comorbidities Age;Time since onset of injury/illness/exacerbation;Comorbidity 3+;Past/Current Experience;Education;Social Background;Transportation;Sex;Fitness    Comorbidities diastolic congestive heart failure, diabetes mellitus, diabetic neuropathy, hypertension, hyperlipidemia, GERD, peripheral vascular disease, CVA. on 9/2 had amputation of L foot second toe MTP joint and LLE revascularization, recent hx of COVID19    Examination-Activity Limitations Bathing;Hygiene/Grooming;Squat;Bed Mobility;Stairs;Lift;Bend;Locomotion Level;Stand;Caring for [employer];Toileting;Carry;Self Feeding;Transfers;Continence;Dressing;Other;Sit    Examination-Participation Restrictions Cleaning;Shop;Community Activity;Meal Prep;Driving;Other    Stability/Clinical Decision Making Evolving/Moderate complexity    Rehab Potential Poor    PT Frequency 2x / week    PT Duration 12 weeks    PT Treatment/Interventions ADLs/Self Care Home Management;DME Instruction;Functional mobility training;Therapeutic activities;Therapeutic  exercise;Neuromuscular re-education;Patient/family education;Wheelchair mobility training;Passive range of motion;Energy  conservation;Cryotherapy;Biofeedback;Electrical Stimulation;Iontophoresis 7m/ml Dexamethasone;Moist Heat;Traction;Ultrasound;Stair training;Cognitive remediation;Balance training;Orthotic Fit/Training;Manual techniques;Manual lymph drainage;Splinting;Taping;Vasopneumatic Device;Vestibular;Visual/perceptual remediation/compensation;Gait training    PT Next Visit Plan bed mobility, pressure relief techniques;    PT Home Exercise Plan Previous- Seated lateral weight shift pressure relief technique in wheelchair to be performed 5 days/week for 2 sets of 10 repetitions each side.  PT provided patient with picture of technique as well.  05/21/2021- Access Code: [email or web address]   Consulted and Agree with Plan of Care Family member/caregiver    Family Member Consulted Spouse             Patient will benefit from skilled therapeutic intervention in order to improve the following deficits and impairments:  Decreased cognition, Decreased skin integrity, Impaired sensation, Decreased mobility, Postural dysfunction, Decreased activity tolerance, Decreased endurance, Decreased range of motion, Decreased strength, Decreased balance, Decreased safety awareness, Increased edema, Impaired flexibility, Decreased coordination, Cardiopulmonary status limiting activity, Abnormal gait, Impaired UE functional use, Impaired tone, Improper body mechanics, Pain, Difficulty walking, Decreased knowledge of use of DME, Hypomobility, Impaired perceived functional ability  Visit Diagnosis: Abnormality of gait and mobility  Other lack of coordination  Difficulty in walking, not elsewhere classified  Unsteadiness on feet  Muscle weakness (generalized)  Other abnormalities of gait and mobility     Problem List Patient Active Problem List   Diagnosis Date Noted   Atherosclerosis of native arteries of extremity with rest pain ([facility] 04/20/2021   COVID-19 virus infection 03/07/2021   Acute febrile illness 03/07/2021    Hypoalbuminemia 03/07/2021   Hyperglycemia due to diabetes mellitus ([facility] 03/07/2021   Elevated troponin 03/07/2021   Constipation 03/07/2021   COPD (chronic obstructive pulmonary disease) ([facility] 10/20/2020   GERD (gastroesophageal reflux disease) 10/20/2020   Physical deconditioning 10/20/2020   Cellulitis 10/19/2020   Necrotic toes (HCC)    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)    Diabetic neuropathy (HCC)    Gangrene of toe of both feet ([facility] 06/06/2019   Peripheral arterial disease ([facility] 04/27/2019   ESBL (extended spectrum beta-lactamase) producing bacteria infection 12/25/2018   PVD (peripheral vascular disease) ([facility] 09/12/2016   Ulcer of right lower extremity ([facility] 08/29/2016   Acute on chronic diastolic heart failure ([facility] 07/08/2016   HTN (hypertension) 07/05/2016   Sensorineural hearing loss (SNHL), 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   Chronic diastolic CHF (congestive heart failure) ([facility] 03/10/2016   HLD (hyperlipidemia) 03/09/2016   Acute respiratory acidosis (HCC) 03/05/2016   Anemia, iron deficiency 03/03/2016   Cerebrovascular accident (CVA) due to thrombosis of precerebral artery ([facility] [place] 03/01/2016   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 ([facility] 03/01/2016   Atherosclerosis of native arteries of the extremities with gangrene ([facility] 12/08/2015   Anemia, chronic disease 04/27/2014   Unspecified hereditary and idiopathic peripheral neuropathy 03/14/2014   DM2 (diabetes mellitus, type 2) ([facility] [place] 02/14/2014   Aortic stenosis 02/04/2014   Closed left subtrochanteric femur fracture ([place] 02/03/2014   Hip fracture, left ([facility] 02/03/2014   Fibula fracture 02/03/2014   MTP instability 02/03/2014   Hip fracture ([facility] 02/03/2014    KPatrina LeveringPT, DPT  [place]RR[address] [place] [address]Phone: [phone]  Fax:  [phone] Name: [name] TRIGOMRN: [id number] of Birth:  404-08-1940

## 2021-06-19 ENCOUNTER — Ambulatory Visit: Payer: Medicare HMO | Admitting: Physical Therapy

## 2021-06-19 ENCOUNTER — Other Ambulatory Visit: Payer: Self-pay

## 2021-06-19 DIAGNOSIS — R269 Unspecified abnormalities of gait and mobility: Secondary | ICD-10-CM | POA: Diagnosis not present

## 2021-06-19 DIAGNOSIS — R262 Difficulty in walking, not elsewhere classified: Secondary | ICD-10-CM

## 2021-06-19 DIAGNOSIS — R2681 Unsteadiness on feet: Secondary | ICD-10-CM

## 2021-06-19 NOTE — Therapy (Signed)
[place] MAIN [facility] SERVICES [address] [place], [place], [address] Phone: [phone]   Fax:  [phone]  Physical Therapy Treatment  Patient Details  Name: [name] MRN: [id number] Date of Birth: April 29, 1941 Referring Provider (PT): Sharin Grave   Encounter Date: 06/19/2021   PT End of Session - 06/19/21 1456     Visit Number 12    Number of Visits 25    Date for PT Re-Evaluation 08/02/21    Authorization Type [employer]; Medicaid    Authorization Time Period 05/10/21-08/02/21    Progress Note Due on Visit 10    PT Start Time 1347    PT Stop Time 1428    PT Time Calculation (min) 41 min    Equipment Utilized During Treatment Gait belt    Activity Tolerance Patient tolerated treatment well;Patient limited by fatigue    Behavior During Therapy Flat affect;Impulsive             Past Medical History:  Diagnosis Date   Acute heart failure ([place])    Aortic stenosis    CHF (congestive heart failure) (HCC)    Complication of anesthesia    Hard to wake patient up after having anesthesia   Diabetes mellitus without complication ([place])    Diabetic neuropathy ([place])    Takes Lyrica   Edema of both legs    Takes Lasix   GERD (gastroesophageal reflux disease)    High cholesterol    HTN (hypertension)    Hypertension    PAD (peripheral artery disease) (HCC)    Shortness of breath dyspnea    Spasm of back muscles    Stroke (HCC)    Wound, open    Right great toe    Past Surgical History:  Procedure Laterality Date   AMPUTATION TOE Left 06/09/2019   Procedure: Left third toe and partial great toe amputation and debridement;  Surgeon: Katha Cabal, MD;  Location: [facility];  Service: Vascular;  Laterality: Left;   AMPUTATION TOE Left 04/06/2020   Procedure: AMPUTATION LEFT SECOND TOE;  Surgeon: Samara Deist, DPM;  Location: [facility];  Service: Podiatry;  Laterality: Left;   BYPASS GRAFT POPLITEAL TO TIBIAL Right  02/28/2016   Procedure: BYPASS GRAFT RIGHT BELOW KNEE POPLITEAL TO PERONEAL USING REVERSED RIGHT GREATER SAPHENOUS VEIN;  Surgeon: Conrad Monroe, MD;  Location: [place];  Service: Vascular;  Laterality: Right;   CYST EXCISION     Abdomen   EYE SURGERY Bilateral    Cataract removal   INTRAMEDULLARY (IM) NAIL INTERTROCHANTERIC Left 02/04/2014   Procedure: INTRAMEDULLARY (IM) NAIL INTERTROCHANTRIC FEMORAL;  Surgeon: Mauri Pole, MD;  Location: [place];  Service: Orthopedics;  Laterality: Left;   IR GENERIC HISTORICAL  03/01/2016   IR ANGIO INTRA EXTRACRAN SEL COM CAROTID INNOMINATE UNI R MOD SED 03/01/2016 [name], MD [facility]   IR GENERIC HISTORICAL  03/01/2016   IR ENDOVASC INTRACRANIAL INF OTHER THAN THROMBO ART INC DIAG ANGIO 03/01/2016 [name], MD [facility]   IR GENERIC HISTORICAL  03/01/2016   IR INTRAVSC STENT CERV CAROTID W/O EMB-PROT MOD SED INC ANGIO 03/01/2016 [name], MD [facility]   IR GENERIC HISTORICAL  06/03/2016   IR RADIOLOGIST EVAL & MGMT 06/03/2016 [facility]   LOWER EXTREMITY ANGIOGRAPHY Left 02/09/2019   Procedure: LOWER EXTREMITY ANGIOGRAPHY;  Surgeon: Katha Cabal, MD;  Location: [place] [facility];  Service: Cardiovascular;  Laterality: Left;   LOWER EXTREMITY ANGIOGRAPHY Left  03/10/2019   Procedure: LOWER EXTREMITY ANGIOGRAPHY;  Surgeon: Katha Cabal, MD;  Location: [place] [facility];  Service: Cardiovascular;  Laterality: Left;   LOWER EXTREMITY ANGIOGRAPHY Left 04/27/2019   Procedure: LOWER EXTREMITY ANGIOGRAPHY;  Surgeon: Katha Cabal, MD;  Location: [place] [facility];  Service: Cardiovascular;  Laterality: Left;   LOWER EXTREMITY ANGIOGRAPHY Left 06/08/2019   Procedure: Lower Extremity Angiography;  Surgeon: Katha Cabal, MD;  Location: [place] [facility];  Service: Cardiovascular;  Laterality: Left;   LOWER EXTREMITY ANGIOGRAPHY Left 04/03/2020   Procedure: Lower Extremity Angiography;  Surgeon: Algernon Huxley, MD;  Location: [place] [facility];  Service: Cardiovascular;  Laterality: Left;   LOWER EXTREMITY ANGIOGRAPHY Right 10/20/2020   Procedure: Lower Extremity Angiography;  Surgeon: Katha Cabal, MD;  Location: [place] [facility];  Service: Cardiovascular;  Laterality: Right;   LOWER EXTREMITY ANGIOGRAPHY Left 01/23/2021   Procedure: LOWER EXTREMITY ANGIOGRAPHY;  Surgeon: Katha Cabal, MD;  Location: [place] [facility];  Service: Cardiovascular;  Laterality: Left;   LOWER EXTREMITY ANGIOGRAPHY Right 04/10/2021   Procedure: LOWER EXTREMITY ANGIOGRAPHY;  Surgeon: Katha Cabal, MD;  Location: [place] [facility];  Service: Cardiovascular;  Laterality: Right;   ORIF TOE FRACTURE Right 02/08/2014   Procedure: OPEN REDUCTION INTERNAL FIXATION Right METATARSAL  FRACTURE ;  Surgeon: Wylene Simmer, MD;  Location: [place];  Service: Orthopedics;  Laterality: Right;   PERIPHERAL VASCULAR CATHETERIZATION N/A 12/28/2015   Procedure: Abdominal Aortogram w/Lower Extremity;  Surgeon: Conrad [name], MD;  Location: [place] [facility];  Service: Cardiovascular;  Laterality: N/A;   RADIOLOGY WITH ANESTHESIA N/A 03/01/2016   Procedure: RADIOLOGY WITH ANESTHESIA;  Surgeon: [name], MD;  Location: [place];  Service: Radiology;  Laterality: N/A;   VEIN HARVEST Right 02/28/2016   Procedure: RIGHT GREATER SAPHENOUS VEIN HARVEST;  Surgeon: Conrad Tulsa, MD;  Location: [place];  Service: Vascular;  Laterality: Right;    There were no vitals filed for this visit.   Subjective Assessment - 06/19/21 1454     Subjective Pt caregiver reports improvement since previous session. Does report some difficulty hearing at this time.    Patient is accompained by: Family member    Pertinent History 11yoF presenting to [facility] to address recent gradual progressive decline in mobility, family goals for improved strength, to promote > participation in transfers to wheelchair and safety with ADL performance.  Recent illness includes admission to [facility] on 03/07/21-03/10/21 due to COVID infection c AMS and fever. Pt then underwent outpatient recanalization/limb salvage of RLE 04/10/2021; LLE salvage performed in June 2022. Other PMH: CHF, DM, HTN, CVA, COPD, multiple remote toe amputations, stg 2 sacral ulcer. PLOF includes 3-4 years bed bound status, hoyerlifts to WC, totalA for ADL performance. Pt is cared for by family, as well as professional caregiver services.    Limitations Sitting;Walking;Lifting;Standing;House hold activities    How long can you sit comfortably? Pt spouse reports 2 hours. Must be careful d/t preventing and trying to heal wounds on bottom.    How long can you stand comfortably? unable    How long can you walk comfortably? Unable    Diagnostic tests 6/21 and 9/06 peripheral vascular catheterizatoin; 04/10/2021 Successful recanalization right lower extremity for limb salvage    Patient Stated Goals to strengthen pt's trunk to improve mobility in chair, assist with ADLs    Pain Onset More than a month ago             *  Pt having difficulty with hearing this session. Pt caregiver rports this has been an issue for the past couple days. He does report pain levels have improved   Seated in chair:  Forward/posterior weight shift with PT assisting at trunk/scapulae for upright posture, hold 3 seconds ; 10x; 1 x 10 second hold, 1 x 30 second hold - pt utilizing BUE support with holds.    Forward reach for target (cones) ; x  6 BUE. Pt did not tolerate this activity well, as she did not understand why she was reaching for cones, deferred to ball for further weight shifting activities and pt tolerance for activity improved    Baloon hits x 30 with R UE, pt encouraged to utilize B UE but only utilizes R UE Ball toss and throw x 15  Ball reach to the left, forward and right followed by throw back to pt in order to improve weight shifting. X 5 to each side, pt reported she was tired during this  exercise    2lb ankle weights: -LAQ, 2x10 each LE ,cueing for sequencing ;  -hip flexion/march, 1x10 each LE; tactile cueing and AAROM for improved range.    1.5#lb ankle weights on UE  -bicep curls 10x each UE -shoulder abduction 1x4, tactile cueing for technique, pt unable to complete, reported "they are too heavy", despite removing weights pt still unable to complete adequately.    Row with YTB, x10 reps., pt leg go of TB each repetition, had to replace TB in hands each repetition     Pt required occasional rest breaks due fatigue, PT was quick to ask when pt appeared to be fatiguing in order to prevent excessive fatigue.                           PT Education - 06/19/21 1455     Education Details positioning    Person(s) Educated Patient    Methods Demonstration;Explanation    Comprehension Verbalized understanding              PT Short Term Goals - 06/12/21 1358       PT SHORT TERM GOAL #1   Title Patient will be independent in home exercise program to improve strength/mobility for better functional independence with ADLs.    Baseline 10/6: to be initiated, 11/8: caregiver reports she is doing them 3x a day;    Time 6    Period Weeks    Status Partially Met    Target Date 06/21/21               PT Long Term Goals - 06/12/21 1402       PT LONG TERM GOAL #1   Title Patient will increase FOTO score to equal to or greater than 44 to demonstrate statistically significant improvement in mobility and quality of life.    Baseline 10/6: 26, 11/8: 12%    Time 12    Period Weeks    Status Not Met    Target Date 08/02/21      PT LONG TERM GOAL #2   Title Patient will roll L and R in bed with mod A for decreased caregiver burden for ADLs and toileting.    Baseline 10/6: per pt spouse, pt currently dependent, 11/8: mod A, inconsistent    Time 12    Period Weeks    Status Partially Met    Target Date 08/02/21      PT LONG  TERM GOAL #3    Title Patient will sit upright in WC, with BUE support and without leaning on backrest for 10 minutes for ADL performance and pressure relief.    Baseline 10/6: pt exhibits core contraction but unable to sit upright, 11/8: able to sit upright for <20 sec with mod VCs for erect positioning    Time 12    Period Weeks    Status Partially Met    Target Date 08/02/21      PT LONG TERM GOAL #4   Title Pt will demonstrate ability to utilize BUEs to weight-shift to each side in WC and maintain weight shift for 30 seconds to assist in pressure relief and reduece risk of wound formation.    Baseline 10/6: pt currently unable, 11/8: unable to initiate weight shift, able to use UE but unable to shift hips without assistance;    Time 12    Period Weeks    Status Partially Met    Target Date 08/02/21                   Plan - 06/19/21 1458     Clinical Impression Statement Patient arrived to therapy with husband accompanying her, husband states pain improved since previous session.  Patient demonstrated fair motivation for physical therapy exercises today but reports she was tired outside and reported weight sected were heavy although she was able to complete them without obvious signs of effort or fatigue.  Patient demonstrated improved tolerance when utilizing ball and balloon for throwing and tapping in order to improve weight shifting.  Patient not interested in weight shifting activities involving reaching for cones.  Patient also had difficulty staying attentive to task and required frequent verbal cues as patient tended to close eyes as if she was tired and wanted to go to sleep.  Patient continue to benefit from skilled physical therapy intervention in order to improve her ability to weight shift, improve her ability to perform pressure this activities, also to improve caregiver burden and improve her overall function.    Personal Factors and Comorbidities Age;Time since onset of  injury/illness/exacerbation;Comorbidity 3+;Past/Current Experience;Education;Social Background;Transportation;Sex;Fitness    Comorbidities diastolic congestive heart failure, diabetes mellitus, diabetic neuropathy, hypertension, hyperlipidemia, GERD, peripheral vascular disease, CVA. on 9/2 had amputation of L foot second toe MTP joint and LLE revascularization, recent hx of COVID19    Examination-Activity Limitations Bathing;Hygiene/Grooming;Squat;Bed Mobility;Stairs;Lift;Bend;Locomotion Level;Stand;Caring for [employer];Toileting;Carry;Self Feeding;Transfers;Continence;Dressing;Other;Sit    Examination-Participation Restrictions Cleaning;Shop;Community Activity;Meal Prep;Driving;Other    Stability/Clinical Decision Making Evolving/Moderate complexity    Rehab Potential Poor    PT Frequency 2x / week    PT Duration 12 weeks    PT Treatment/Interventions ADLs/Self Care Home Management;DME Instruction;Functional mobility training;Therapeutic activities;Therapeutic exercise;Neuromuscular re-education;Patient/family education;Wheelchair mobility training;Passive range of motion;Energy conservation;Cryotherapy;Biofeedback;Electrical Stimulation;Iontophoresis 11m/ml Dexamethasone;Moist Heat;Traction;Ultrasound;Stair training;Cognitive remediation;Balance training;Orthotic Fit/Training;Manual techniques;Manual lymph drainage;Splinting;Taping;Vasopneumatic Device;Vestibular;Visual/perceptual remediation/compensation;Gait training    PT Next Visit Plan bed mobility, pressure relief techniques;    PT Home Exercise Plan Previous- Seated lateral weight shift pressure relief technique in wheelchair to be performed 5 days/week for 2 sets of 10 repetitions each side.  PT provided patient with picture of technique as well.  05/21/2021- Access Code: [email or web address]   Consulted and Agree with Plan of Care Family member/caregiver    Family Member Consulted Spouse             Patient will benefit from skilled  therapeutic intervention in order to improve the following deficits and impairments:  Decreased cognition, Decreased skin  integrity, Impaired sensation, Decreased mobility, Postural dysfunction, Decreased activity tolerance, Decreased endurance, Decreased range of motion, Decreased strength, Decreased balance, Decreased safety awareness, Increased edema, Impaired flexibility, Decreased coordination, Cardiopulmonary status limiting activity, Abnormal gait, Impaired UE functional use, Impaired tone, Improper body mechanics, Pain, Difficulty walking, Decreased knowledge of use of DME, Hypomobility, Impaired perceived functional ability  Visit Diagnosis: Abnormality of gait and mobility  Difficulty in walking, not elsewhere classified  Unsteadiness on feet     Problem List Patient Active Problem List   Diagnosis Date Noted   Atherosclerosis of native arteries of extremity with rest pain ([place]) 04/20/2021   COVID-19 virus infection 03/07/2021   Acute febrile illness 03/07/2021   Hypoalbuminemia 03/07/2021   Hyperglycemia due to diabetes mellitus ([place]) 03/07/2021   Elevated troponin 03/07/2021   Constipation 03/07/2021   COPD (chronic obstructive pulmonary disease) ([place]) 10/20/2020   GERD (gastroesophageal reflux disease) 10/20/2020   Physical deconditioning 10/20/2020   Cellulitis 10/19/2020   Necrotic toes (HCC)    Insulin dependent diabetes mellitus type IA ([place])    Diabetic neuropathy (HCC)    Gangrene of toe of both feet ([place]) 06/06/2019   Peripheral arterial disease ([place]) 04/27/2019   ESBL (extended spectrum beta-lactamase) producing bacteria infection 12/25/2018   PVD (peripheral vascular disease) ([place]) 09/12/2016   Ulcer of right lower extremity ([place]) 08/29/2016   Acute on chronic diastolic heart failure ([place]) 07/08/2016   HTN (hypertension) 07/05/2016   Sensorineural hearing loss (SNHL), 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   Chronic diastolic CHF (congestive heart failure) ([place]) 03/10/2016    HLD (hyperlipidemia) 03/09/2016   Acute respiratory acidosis (HCC) 03/05/2016   Anemia, iron deficiency 03/03/2016   Cerebrovascular accident (CVA) due to thrombosis of precerebral artery ([place]) 03/01/2016   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 ([place]) 03/01/2016   Atherosclerosis of native arteries of the extremities with gangrene ([place]) 12/08/2015   Anemia, chronic disease 04/27/2014   Unspecified hereditary and idiopathic peripheral neuropathy 03/14/2014   DM2 (diabetes mellitus, type 2) ([place]) 02/14/2014   Aortic stenosis 02/04/2014   Closed left subtrochanteric femur fracture ([place]) 02/03/2014   Hip fracture, left ([place]) 02/03/2014   Fibula fracture 02/03/2014   MTP instability 02/03/2014   Hip fracture ([place]) 02/03/2014    Particia Lather, PT 06/19/2021, 3:02 PM  [facility] MAIN [facility] SERVICES [address] [place], [place], [address] Phone: [phone]   Fax:  [phone]  Name: [name] MRN: [id number] Date of Birth: [birth year]

## 2021-06-21 ENCOUNTER — Ambulatory Visit: Payer: Medicare HMO | Admitting: Physical Therapy

## 2021-06-21 ENCOUNTER — Encounter: Payer: Self-pay | Admitting: Physical Therapy

## 2021-06-21 ENCOUNTER — Other Ambulatory Visit: Payer: Self-pay

## 2021-06-21 DIAGNOSIS — R269 Unspecified abnormalities of gait and mobility: Secondary | ICD-10-CM

## 2021-06-21 DIAGNOSIS — R2689 Other abnormalities of gait and mobility: Secondary | ICD-10-CM

## 2021-06-21 DIAGNOSIS — M6281 Muscle weakness (generalized): Secondary | ICD-10-CM

## 2021-06-21 DIAGNOSIS — R262 Difficulty in walking, not elsewhere classified: Secondary | ICD-10-CM

## 2021-06-21 DIAGNOSIS — R2681 Unsteadiness on feet: Secondary | ICD-10-CM

## 2021-06-21 DIAGNOSIS — R278 Other lack of coordination: Secondary | ICD-10-CM

## 2021-06-21 NOTE — Therapy (Signed)
[place] MAIN [facility] SERVICES [address] [place], [place], [address] Phone: [phone]   Fax:  [phone]  Physical Therapy Treatment  Patient Details  Name: [name] MRN: [id number] Date of Birth: January 27, 1941 Referring Provider (PT): Sharin Grave   Encounter Date: 06/21/2021   PT End of Session - 06/21/21 1326     Visit Number 13    Number of Visits 25    Date for PT Re-Evaluation 08/02/21    Authorization Type [employer]; Medicaid    Authorization Time Period 05/10/21-08/02/21    Progress Note Due on Visit 10    PT Start Time 1157    PT Stop Time 1230    PT Time Calculation (min) 33 min    Equipment Utilized During Treatment Gait belt    Activity Tolerance Patient tolerated treatment well;Patient limited by fatigue    Behavior During Therapy Flat affect;Impulsive             Past Medical History:  Diagnosis Date   Acute heart failure ([place])    Aortic stenosis    CHF (congestive heart failure) (HCC)    Complication of anesthesia    Hard to wake patient up after having anesthesia   Diabetes mellitus without complication ([place])    Diabetic neuropathy ([place])    Takes Lyrica   Edema of both legs    Takes Lasix   GERD (gastroesophageal reflux disease)    High cholesterol    HTN (hypertension)    Hypertension    PAD (peripheral artery disease) (HCC)    Shortness of breath dyspnea    Spasm of back muscles    Stroke (HCC)    Wound, open    Right great toe    Past Surgical History:  Procedure Laterality Date   AMPUTATION TOE Left 06/09/2019   Procedure: Left third toe and partial great toe amputation and debridement;  Surgeon: Katha Cabal, MD;  Location: [facility];  Service: Vascular;  Laterality: Left;   AMPUTATION TOE Left 04/06/2020   Procedure: AMPUTATION LEFT SECOND TOE;  Surgeon: Samara Deist, DPM;  Location: [facility];  Service: Podiatry;  Laterality: Left;   BYPASS GRAFT POPLITEAL TO TIBIAL Right  02/28/2016   Procedure: BYPASS GRAFT RIGHT BELOW KNEE POPLITEAL TO PERONEAL USING REVERSED RIGHT GREATER SAPHENOUS VEIN;  Surgeon: Conrad Olympia, MD;  Location: [place];  Service: Vascular;  Laterality: Right;   CYST EXCISION     Abdomen   EYE SURGERY Bilateral    Cataract removal   INTRAMEDULLARY (IM) NAIL INTERTROCHANTERIC Left 02/04/2014   Procedure: INTRAMEDULLARY (IM) NAIL INTERTROCHANTRIC FEMORAL;  Surgeon: Mauri Pole, MD;  Location: [place];  Service: Orthopedics;  Laterality: Left;   IR GENERIC HISTORICAL  03/01/2016   IR ANGIO INTRA EXTRACRAN SEL COM CAROTID INNOMINATE UNI R MOD SED 03/01/2016 [name], MD [facility]   IR GENERIC HISTORICAL  03/01/2016   IR ENDOVASC INTRACRANIAL INF OTHER THAN THROMBO ART INC DIAG ANGIO 03/01/2016 [name], MD [facility]   IR GENERIC HISTORICAL  03/01/2016   IR INTRAVSC STENT CERV CAROTID W/O EMB-PROT MOD SED INC ANGIO 03/01/2016 [name], MD [facility]   IR GENERIC HISTORICAL  06/03/2016   IR RADIOLOGIST EVAL & MGMT 06/03/2016 [facility]   LOWER EXTREMITY ANGIOGRAPHY Left 02/09/2019   Procedure: LOWER EXTREMITY ANGIOGRAPHY;  Surgeon: Katha Cabal, MD;  Location: [place] [facility];  Service: Cardiovascular;  Laterality: Left;   LOWER EXTREMITY ANGIOGRAPHY Left  03/10/2019   Procedure: LOWER EXTREMITY ANGIOGRAPHY;  Surgeon: Katha Cabal, MD;  Location: [place] [facility];  Service: Cardiovascular;  Laterality: Left;   LOWER EXTREMITY ANGIOGRAPHY Left 04/27/2019   Procedure: LOWER EXTREMITY ANGIOGRAPHY;  Surgeon: Katha Cabal, MD;  Location: [place] [facility];  Service: Cardiovascular;  Laterality: Left;   LOWER EXTREMITY ANGIOGRAPHY Left 06/08/2019   Procedure: Lower Extremity Angiography;  Surgeon: Katha Cabal, MD;  Location: [place] [facility];  Service: Cardiovascular;  Laterality: Left;   LOWER EXTREMITY ANGIOGRAPHY Left 04/03/2020   Procedure: Lower Extremity Angiography;  Surgeon: Algernon Huxley, MD;  Location: [place] [facility];  Service: Cardiovascular;  Laterality: Left;   LOWER EXTREMITY ANGIOGRAPHY Right 10/20/2020   Procedure: Lower Extremity Angiography;  Surgeon: Katha Cabal, MD;  Location: [place] [facility];  Service: Cardiovascular;  Laterality: Right;   LOWER EXTREMITY ANGIOGRAPHY Left 01/23/2021   Procedure: LOWER EXTREMITY ANGIOGRAPHY;  Surgeon: Katha Cabal, MD;  Location: [place] [facility];  Service: Cardiovascular;  Laterality: Left;   LOWER EXTREMITY ANGIOGRAPHY Right 04/10/2021   Procedure: LOWER EXTREMITY ANGIOGRAPHY;  Surgeon: Katha Cabal, MD;  Location: [place] [facility];  Service: Cardiovascular;  Laterality: Right;   ORIF TOE FRACTURE Right 02/08/2014   Procedure: OPEN REDUCTION INTERNAL FIXATION Right METATARSAL  FRACTURE ;  Surgeon: Wylene Simmer, MD;  Location: [place];  Service: Orthopedics;  Laterality: Right;   PERIPHERAL VASCULAR CATHETERIZATION N/A 12/28/2015   Procedure: Abdominal Aortogram w/Lower Extremity;  Surgeon: Conrad [place], MD;  Location: [place] [facility];  Service: Cardiovascular;  Laterality: N/A;   RADIOLOGY WITH ANESTHESIA N/A 03/01/2016   Procedure: RADIOLOGY WITH ANESTHESIA;  Surgeon: [name], MD;  Location: [place];  Service: Radiology;  Laterality: N/A;   VEIN HARVEST Right 02/28/2016   Procedure: RIGHT GREATER SAPHENOUS VEIN HARVEST;  Surgeon: Conrad Elmwood, MD;  Location: [place];  Service: Vascular;  Laterality: Right;    There were no vitals filed for this visit.   Subjective Assessment - 06/21/21 1159     Subjective Pt reports increased pain in both feet; Her husband reports she has been having a hard time hearing over last few days. Otherwise he thinks she is doing better;    Patient is accompained by: Family member    Pertinent History 50yoF presenting to [place] to address recent gradual progressive decline in mobility, family goals for improved strength, to promote > participation in  transfers to wheelchair and safety with ADL performance. Recent illness includes admission to [facility] on 03/07/21-03/10/21 due to COVID infection c AMS and fever. Pt then underwent outpatient recanalization/limb salvage of RLE 04/10/2021; LLE salvage performed in June 2022. Other PMH: CHF, DM, HTN, CVA, COPD, multiple remote toe amputations, stg 2 sacral ulcer. PLOF includes 3-4 years bed bound status, hoyerlifts to WC, totalA for ADL performance. Pt is cared for by family, as well as professional caregiver services.    Limitations Sitting;Walking;Lifting;Standing;House hold activities    How long can you sit comfortably? Pt spouse reports 2 hours. Must be careful d/t preventing and trying to heal wounds on bottom.    How long can you stand comfortably? unable    How long can you walk comfortably? Unable    Diagnostic tests 6/21 and 9/06 peripheral vascular catheterizatoin; 04/10/2021 Successful recanalization right lower extremity for limb salvage    Patient Stated Goals to strengthen pt's trunk to improve mobility in chair, assist with ADLs  Currently in Pain? Yes    Pain Score --   unable to rate, hurts a little   Pain Location Foot    Pain Orientation Right;Left    Pain Descriptors / Indicators Aching;Sore    Pain Type Chronic pain    Pain Onset More than a month ago    Pain Frequency Intermittent    Aggravating Factors  worse with standing/weight bearing    Pain Relieving Factors rest    Effect of Pain on Daily Activities decreased activity tolerance;    Multiple Pain Sites No                  TREATMENT:   *Pt having difficulty with hearing this session. Pt caregiver reports this has been an issue for the past couple days. He does report pain levels have improved    Ex: Seated in chair:  Forward weight shift with UE on arm rest and in lap x10 reps with therapist assist. Patient required max A initially but then able to participate in forward weight shift progressing to min A/CGA.  She was able to hold forward weight shift for 2-3 sec initially; However after increased repetition, patient progressed to sitting forward in chair unsupported for 50 sec with close supervision;   NMR: Instructed patient in picking up cone from floor, patient unable feeling unsafe to reach to floor  Instructed patient in forward reach of cone from therapist hands and pass to side x4 reps with min A for trunk control to avoid loss of balance  Progressed to side/side cone pass with single UE on arm rest of chair for trunk control x4 reps each direction;   Therapist sitting in front of patient holding patient's BUE: Side/side weight shift x5 reps Forward/backward weight shift x5 reps with cues to avoid heavy posterior lean and to work towards leaning forward without therapist assistance  Patient able to progress with sitting with BUE in lap with erect posture unsupported Instructed patient in turning head to look behind/side x2 reps each required min A to avoid posterior loss of balance;  Patient able to progress to sitting unsupported for 1-2 min with UE in lap with better positioning;     Ex:   2lb ankle weights: -LAQ, x10 each LE ,cueing for sequencing and to slow down LE movement;  -hip flexion/march, 1x10 each LE; tactile cueing and AAROM for improved range.     Pt required occasional rest breaks due fatigue; she often falls backward in chair with fatigue requiring cues to pull self up to erect posture.  Patient was max A +2 for repositioning in chair for better safety. She reports no increase in pain with advanced exercise. She was able to progress in session with better sitting balance; Patient continues to exhibit posterior lean with initial session likely from prolonged bed positioning. Caregiver reports they are still having a hard time finding aides who are comfortable using hoyer lift. Educated caregiver to consider home health PT to come out and train him and aides in [employer] lift  use.                                  PT Education - 06/21/21 1325     Education Details positioning, strengthening exercise    Person(s) Educated Patient    Methods Explanation;Verbal cues    Comprehension Verbalized understanding;Returned demonstration;Verbal cues required;Need further instruction  PT Short Term Goals - 06/12/21 1358       PT SHORT TERM GOAL #1   Title Patient will be independent in home exercise program to improve strength/mobility for better functional independence with ADLs.    Baseline 10/6: to be initiated, 11/8: caregiver reports she is doing them 3x a day;    Time 6    Period Weeks    Status Partially Met    Target Date 06/21/21               PT Long Term Goals - 06/12/21 1402       PT LONG TERM GOAL #1   Title Patient will increase FOTO score to equal to or greater than 44 to demonstrate statistically significant improvement in mobility and quality of life.    Baseline 10/6: 26, 11/8: 12%    Time 12    Period Weeks    Status Not Met    Target Date 08/02/21      PT LONG TERM GOAL #2   Title Patient will roll L and R in bed with mod A for decreased caregiver burden for ADLs and toileting.    Baseline 10/6: per pt spouse, pt currently dependent, 11/8: mod A, inconsistent    Time 12    Period Weeks    Status Partially Met    Target Date 08/02/21      PT LONG TERM GOAL #3   Title Patient will sit upright in WC, with BUE support and without leaning on backrest for 10 minutes for ADL performance and pressure relief.    Baseline 10/6: pt exhibits core contraction but unable to sit upright, 11/8: able to sit upright for <20 sec with mod VCs for erect positioning    Time 12    Period Weeks    Status Partially Met    Target Date 08/02/21      PT LONG TERM GOAL #4   Title Pt will demonstrate ability to utilize BUEs to weight-shift to each side in WC and maintain weight shift for 30 seconds to assist in  pressure relief and reduece risk of wound formation.    Baseline 10/6: pt currently unable, 11/8: unable to initiate weight shift, able to use UE but unable to shift hips without assistance;    Time 12    Period Weeks    Status Partially Met    Target Date 08/02/21                   Plan - 06/21/21 1326     Clinical Impression Statement Patient arrived late to therapy. Husband reports transportation was late. Patient reports feeling well today. overall she tolerated session well with less complaints of fatigue and pain. Patient instructed in sitting posture. Initially she exhibits heavy posterior lean having difficulty achieving a forward weight shift. However with increased repetition and instruction, patient able to sit forward with better neutral position. She does exhibit slumped posture having trouble with lumbar extension. Patient instructed in sitting balance exercise requiring min A for safety. She also required AAROM with LE exercise to increase ROM. Patient would benefit from additional skilled PT intervention to improve strength and mobility. Her husband states they are still having a hard time finding aides that feel comfortable using the hoyer lift. As a result, patient spends most of her time in the bed. Recommend family consider having home health come out to the house to re-educate on hoyer lift for increased pain mobility;  Personal Factors and Comorbidities Age;Time since onset of injury/illness/exacerbation;Comorbidity 3+;Past/Current Experience;Education;Social Background;Transportation;Sex;Fitness    Comorbidities diastolic congestive heart failure, diabetes mellitus, diabetic neuropathy, hypertension, hyperlipidemia, GERD, peripheral vascular disease, CVA. on 9/2 had amputation of L foot second toe MTP joint and LLE revascularization, recent hx of COVID19    Examination-Activity Limitations Bathing;Hygiene/Grooming;Squat;Bed Mobility;Stairs;Lift;Bend;Locomotion  Level;Stand;Caring for [employer];Toileting;Carry;Self Feeding;Transfers;Continence;Dressing;Other;Sit    Examination-Participation Restrictions Cleaning;Shop;Community Activity;Meal Prep;Driving;Other    Stability/Clinical Decision Making Evolving/Moderate complexity    Rehab Potential Poor    PT Frequency 2x / week    PT Duration 12 weeks    PT Treatment/Interventions ADLs/Self Care Home Management;DME Instruction;Functional mobility training;Therapeutic activities;Therapeutic exercise;Neuromuscular re-education;Patient/family education;Wheelchair mobility training;Passive range of motion;Energy conservation;Cryotherapy;Biofeedback;Electrical Stimulation;Iontophoresis 78m/ml Dexamethasone;Moist Heat;Traction;Ultrasound;Stair training;Cognitive remediation;Balance training;Orthotic Fit/Training;Manual techniques;Manual lymph drainage;Splinting;Taping;Vasopneumatic Device;Vestibular;Visual/perceptual remediation/compensation;Gait training    PT Next Visit Plan bed mobility, pressure relief techniques;    PT Home Exercise Plan Previous- Seated lateral weight shift pressure relief technique in wheelchair to be performed 5 days/week for 2 sets of 10 repetitions each side.  PT provided patient with picture of technique as well.  05/21/2021- Access Code: [email or web address]   Consulted and Agree with Plan of Care Family member/caregiver    Family Member Consulted Spouse             Patient will benefit from skilled therapeutic intervention in order to improve the following deficits and impairments:  Decreased cognition, Decreased skin integrity, Impaired sensation, Decreased mobility, Postural dysfunction, Decreased activity tolerance, Decreased endurance, Decreased range of motion, Decreased strength, Decreased balance, Decreased safety awareness, Increased edema, Impaired flexibility, Decreased coordination, Cardiopulmonary status limiting activity, Abnormal gait, Impaired UE functional use, Impaired  tone, Improper body mechanics, Pain, Difficulty walking, Decreased knowledge of use of DME, Hypomobility, Impaired perceived functional ability  Visit Diagnosis: Abnormality of gait and mobility  Difficulty in walking, not elsewhere classified  Unsteadiness on feet  Other lack of coordination  Muscle weakness (generalized)  Other abnormalities of gait and mobility     Problem List Patient Active Problem List   Diagnosis Date Noted   Atherosclerosis of native arteries of extremity with rest pain ([place] 04/20/2021   COVID-19 virus infection 03/07/2021   Acute febrile illness 03/07/2021   Hypoalbuminemia 03/07/2021   Hyperglycemia due to diabetes mellitus ([place] 03/07/2021   Elevated troponin 03/07/2021   Constipation 03/07/2021   COPD (chronic obstructive pulmonary disease) ([place] 10/20/2020   GERD (gastroesophageal reflux disease) 10/20/2020   Physical deconditioning 10/20/2020   Cellulitis 10/19/2020   Necrotic toes (HCC)    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)    Diabetic neuropathy (HCC)    Gangrene of toe of both feet ([place] 06/06/2019   Peripheral arterial disease ([facility] 04/27/2019   ESBL (extended spectrum beta-lactamase) producing bacteria infection 12/25/2018   PVD (peripheral vascular disease) ([facility] 09/12/2016   Ulcer of right lower extremity ([place] 08/29/2016   Acute on chronic diastolic heart failure ([facility] 07/08/2016   HTN (hypertension) 07/05/2016   Sensorineural hearing loss (SNHL), 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   Chronic diastolic CHF (congestive heart failure) ([place] 03/10/2016   HLD (hyperlipidemia) 03/09/2016   Acute respiratory acidosis (HCC) 03/05/2016   Anemia, iron deficiency 03/03/2016   Cerebrovascular accident (CVA) due to thrombosis of precerebral artery ([facility] 03/01/2016   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 ([place] 03/01/2016   Atherosclerosis of native arteries of the extremities with gangrene ([place] 12/08/2015   Anemia,  chronic disease 04/27/2014   Unspecified hereditary and idiopathic peripheral neuropathy 03/14/2014   DM2 (  diabetes mellitus, type 2) ([place]) 02/14/2014   Aortic stenosis 02/04/2014   Closed left subtrochanteric femur fracture ([place]) 02/03/2014   Hip fracture, left ([place]) 02/03/2014   Fibula fracture 02/03/2014   MTP instability 02/03/2014   Hip fracture ([place]) 02/03/2014    Treshawn Allen, PT, DPT 06/21/2021, 1:34 PM  [place] MAIN [facility] SERVICES [place], [place], [address] Phone: [phone]   Fax:  [phone]  Name: [name] MRN: [id number] Date of Birth: 01-Jun-1941

## 2021-06-26 ENCOUNTER — Encounter: Payer: Self-pay | Admitting: Physical Therapy

## 2021-06-26 ENCOUNTER — Ambulatory Visit: Payer: Medicare HMO | Admitting: Physical Therapy

## 2021-06-26 ENCOUNTER — Other Ambulatory Visit: Payer: Self-pay

## 2021-06-26 DIAGNOSIS — R278 Other lack of coordination: Secondary | ICD-10-CM

## 2021-06-26 DIAGNOSIS — M6281 Muscle weakness (generalized): Secondary | ICD-10-CM

## 2021-06-26 DIAGNOSIS — R2689 Other abnormalities of gait and mobility: Secondary | ICD-10-CM

## 2021-06-26 DIAGNOSIS — R2681 Unsteadiness on feet: Secondary | ICD-10-CM

## 2021-06-26 DIAGNOSIS — R269 Unspecified abnormalities of gait and mobility: Secondary | ICD-10-CM

## 2021-06-26 DIAGNOSIS — R262 Difficulty in walking, not elsewhere classified: Secondary | ICD-10-CM

## 2021-06-26 NOTE — Therapy (Signed)
[place] MAIN [facility] SERVICES [address] [place], [place], [address] Phone: [phone]   Fax:  [phone]  Physical Therapy Treatment  Patient Details  Name: [name] MRN: [id number] Date of Birth: 10/29/1940 Referring Provider (PT): Sharin Grave   Encounter Date: 06/26/2021   PT End of Session - 06/26/21 1155     Visit Number 14    Number of Visits 25    Date for PT Re-Evaluation 08/02/21    Authorization Type [employer]; Medicaid    Authorization Time Period 05/10/21-08/02/21    Progress Note Due on Visit 10    PT Start Time 1148    PT Stop Time 1228    PT Time Calculation (min) 40 min    Equipment Utilized During Treatment Gait belt    Activity Tolerance Patient tolerated treatment well;Patient limited by fatigue;Patient limited by pain    Behavior During Therapy Flat affect;Impulsive             Past Medical History:  Diagnosis Date   Acute heart failure ([place])    Aortic stenosis    CHF (congestive heart failure) (HCC)    Complication of anesthesia    Hard to wake patient up after having anesthesia   Diabetes mellitus without complication ([place])    Diabetic neuropathy ([place])    Takes Lyrica   Edema of both legs    Takes Lasix   GERD (gastroesophageal reflux disease)    High cholesterol    HTN (hypertension)    Hypertension    PAD (peripheral artery disease) (HCC)    Shortness of breath dyspnea    Spasm of back muscles    Stroke (HCC)    Wound, open    Right great toe    Past Surgical History:  Procedure Laterality Date   AMPUTATION TOE Left 06/09/2019   Procedure: Left third toe and partial great toe amputation and debridement;  Surgeon: Katha Cabal, MD;  Location: [facility];  Service: Vascular;  Laterality: Left;   AMPUTATION TOE Left 04/06/2020   Procedure: AMPUTATION LEFT SECOND TOE;  Surgeon: Samara Deist, DPM;  Location: [facility];  Service: Podiatry;  Laterality: Left;   BYPASS GRAFT  POPLITEAL TO TIBIAL Right 02/28/2016   Procedure: BYPASS GRAFT RIGHT BELOW KNEE POPLITEAL TO PERONEAL USING REVERSED RIGHT GREATER SAPHENOUS VEIN;  Surgeon: Conrad [place]. Ann, MD;  Location: [place];  Service: Vascular;  Laterality: Right;   CYST EXCISION     Abdomen   EYE SURGERY Bilateral    Cataract removal   INTRAMEDULLARY (IM) NAIL INTERTROCHANTERIC Left 02/04/2014   Procedure: INTRAMEDULLARY (IM) NAIL INTERTROCHANTRIC FEMORAL;  Surgeon: Mauri Pole, MD;  Location: [place];  Service: Orthopedics;  Laterality: Left;   IR GENERIC HISTORICAL  03/01/2016   IR ANGIO INTRA EXTRACRAN SEL COM CAROTID INNOMINATE UNI R MOD SED 03/01/2016 [name], MD [facility]   IR GENERIC HISTORICAL  03/01/2016   IR ENDOVASC INTRACRANIAL INF OTHER THAN THROMBO ART INC DIAG ANGIO 03/01/2016 [name], MD [facility]   IR GENERIC HISTORICAL  03/01/2016   IR INTRAVSC STENT CERV CAROTID W/O EMB-PROT MOD SED INC ANGIO 03/01/2016 [name], MD [facility]   IR GENERIC HISTORICAL  06/03/2016   IR RADIOLOGIST EVAL & MGMT 06/03/2016 [facility]   LOWER EXTREMITY ANGIOGRAPHY Left 02/09/2019   Procedure: LOWER EXTREMITY ANGIOGRAPHY;  Surgeon: Katha Cabal, MD;  Location: [place] [facility];  Service: Cardiovascular;  Laterality: Left;   LOWER  EXTREMITY ANGIOGRAPHY Left 03/10/2019   Procedure: LOWER EXTREMITY ANGIOGRAPHY;  Surgeon: Katha Cabal, MD;  Location: [place] [facility];  Service: Cardiovascular;  Laterality: Left;   LOWER EXTREMITY ANGIOGRAPHY Left 04/27/2019   Procedure: LOWER EXTREMITY ANGIOGRAPHY;  Surgeon: Katha Cabal, MD;  Location: [place] [facility];  Service: Cardiovascular;  Laterality: Left;   LOWER EXTREMITY ANGIOGRAPHY Left 06/08/2019   Procedure: Lower Extremity Angiography;  Surgeon: Katha Cabal, MD;  Location: [place] [facility];  Service: Cardiovascular;  Laterality: Left;   LOWER EXTREMITY ANGIOGRAPHY Left 04/03/2020   Procedure: Lower Extremity  Angiography;  Surgeon: Algernon Huxley, MD;  Location: [place] [facility];  Service: Cardiovascular;  Laterality: Left;   LOWER EXTREMITY ANGIOGRAPHY Right 10/20/2020   Procedure: Lower Extremity Angiography;  Surgeon: Katha Cabal, MD;  Location: [place] [facility];  Service: Cardiovascular;  Laterality: Right;   LOWER EXTREMITY ANGIOGRAPHY Left 01/23/2021   Procedure: LOWER EXTREMITY ANGIOGRAPHY;  Surgeon: Katha Cabal, MD;  Location: [place] [facility];  Service: Cardiovascular;  Laterality: Left;   LOWER EXTREMITY ANGIOGRAPHY Right 04/10/2021   Procedure: LOWER EXTREMITY ANGIOGRAPHY;  Surgeon: Katha Cabal, MD;  Location: [place] [facility];  Service: Cardiovascular;  Laterality: Right;   ORIF TOE FRACTURE Right 02/08/2014   Procedure: OPEN REDUCTION INTERNAL FIXATION Right METATARSAL  FRACTURE ;  Surgeon: Wylene Simmer, MD;  Location: [place];  Service: Orthopedics;  Laterality: Right;   PERIPHERAL VASCULAR CATHETERIZATION N/A 12/28/2015   Procedure: Abdominal Aortogram w/Lower Extremity;  Surgeon: Conrad [place], MD;  Location: [place] [facility];  Service: Cardiovascular;  Laterality: N/A;   RADIOLOGY WITH ANESTHESIA N/A 03/01/2016   Procedure: RADIOLOGY WITH ANESTHESIA;  Surgeon: [name], MD;  Location: [place];  Service: Radiology;  Laterality: N/A;   VEIN HARVEST Right 02/28/2016   Procedure: RIGHT GREATER SAPHENOUS VEIN HARVEST;  Surgeon: Conrad Kalida, MD;  Location: [place];  Service: Vascular;  Laterality: Right;    There were no vitals filed for this visit.   Subjective Assessment - 06/26/21 1153     Subjective Patient reports increased foot pain. She forgot her shoes today. Her spouse states she has been doing good today;    Patient is accompained by: Family member    Pertinent History 76yoF presenting to [place] to address recent gradual progressive decline in mobility, family goals for improved strength, to promote > participation in transfers to wheelchair and  safety with ADL performance. Recent illness includes admission to [facility] on 03/07/21-03/10/21 due to COVID infection c AMS and fever. Pt then underwent outpatient recanalization/limb salvage of RLE 04/10/2021; LLE salvage performed in June 2022. Other PMH: CHF, DM, HTN, CVA, COPD, multiple remote toe amputations, stg 2 sacral ulcer. PLOF includes 3-4 years bed bound status, hoyerlifts to WC, totalA for ADL performance. Pt is cared for by family, as well as professional caregiver services.    Limitations Sitting;Walking;Lifting;Standing;House hold activities    How long can you sit comfortably? Pt spouse reports 2 hours. Must be careful d/t preventing and trying to heal wounds on bottom.    How long can you stand comfortably? unable    How long can you walk comfortably? Unable    Diagnostic tests 6/21 and 9/06 peripheral vascular catheterizatoin; 04/10/2021 Successful recanalization right lower extremity for limb salvage    Patient Stated Goals to strengthen pt's trunk to improve mobility in chair, assist with ADLs    Currently in Pain? Yes  Pain Score --   unable to rate, reports hurting a lot   Pain Location Foot    Pain Orientation Right;Left    Pain Descriptors / Indicators Aching;Sore    Pain Type Chronic pain    Pain Onset More than a month ago    Pain Frequency Intermittent    Aggravating Factors  worse with standing/weight bearing    Pain Relieving Factors rest    Effect of Pain on Daily Activities decreased activity tolerance;    Multiple Pain Sites No                     TREATMENT:   Ex: Seated in chair:  Forward weight shift with UE on arm rest and in lap x10 reps with therapist assist. Patient required increased time and min A to supervision initially but then able to participate in forward weight shift progressing to supervision with better trunk control.  Patient able to hold forward weight shift in sitting for >1 min this session with cues for hand placement and  positioning;    NMR: Instructed patient in reaching with UE side/side to challenge trunk control x5 reps Progressed to cross body reach with sitting unsupported to challenge trunk control x10 reps each with 1 rest break after 5 reps with patient leaning posteriorly;    Patient able to progress with sitting with BUE in lap with erect posture unsupported Instructed patient in turning head/trunk to look behind/side x5 reps each required min A to avoid posterior loss of balance;      Ex:   2lb ankle weights: -LAQ, x10 each LE ,cueing for sequencing and to slow down LE movement;  -hip flexion/march, 1x10 each LE; tactile cueing and AAROM for improved range.     Pt required occasional rest breaks due fatigue; she often falls backward in chair with fatigue requiring cues to pull self up to erect posture.  Patient was max A +3 for repositioning in chair for better safety. She exhibited increased knee extension and posterior lean throughout session, sliding forward in chair. She had difficulty repositioning requiring +3 to reposition in chair. Patient and caregiver educated about proper positioning in chair including to support LE and work on relaxing into knee flexion to prevent posterior lean;                      PT Education - 06/26/21 1155     Education Details positioning, strengthening exercise    Person(s) Educated Patient    Methods Explanation;Verbal cues    Comprehension Verbalized understanding;Returned demonstration;Verbal cues required;Need further instruction              PT Short Term Goals - 06/12/21 1358       PT SHORT TERM GOAL #1   Title Patient will be independent in home exercise program to improve strength/mobility for better functional independence with ADLs.    Baseline 10/6: to be initiated, 11/8: caregiver reports she is doing them 3x a day;    Time 6    Period Weeks    Status Partially Met    Target Date 06/21/21               PT  Long Term Goals - 06/12/21 1402       PT LONG TERM GOAL #1   Title Patient will increase FOTO score to equal to or greater than 44 to demonstrate statistically significant improvement in mobility and quality of life.  Baseline 10/6: 26, 11/8: 12%    Time 12    Period Weeks    Status Not Met    Target Date 08/02/21      PT LONG TERM GOAL #2   Title Patient will roll L and R in bed with mod A for decreased caregiver burden for ADLs and toileting.    Baseline 10/6: per pt spouse, pt currently dependent, 11/8: mod A, inconsistent    Time 12    Period Weeks    Status Partially Met    Target Date 08/02/21      PT LONG TERM GOAL #3   Title Patient will sit upright in WC, with BUE support and without leaning on backrest for 10 minutes for ADL performance and pressure relief.    Baseline 10/6: pt exhibits core contraction but unable to sit upright, 11/8: able to sit upright for <20 sec with mod VCs for erect positioning    Time 12    Period Weeks    Status Partially Met    Target Date 08/02/21      PT LONG TERM GOAL #4   Title Pt will demonstrate ability to utilize BUEs to weight-shift to each side in WC and maintain weight shift for 30 seconds to assist in pressure relief and reduece risk of wound formation.    Baseline 10/6: pt currently unable, 11/8: unable to initiate weight shift, able to use UE but unable to shift hips without assistance;    Time 12    Period Weeks    Status Partially Met    Target Date 08/02/21                   Plan - 06/27/21 0859     Clinical Impression Statement Patient motivated and participated fair within session. She presents to therapy without her shoes, therefore standing was deferred. Patient was able to scoot forward and sit upright better this session compared to previous sessions. She continues to require mod VCS for proper hand placement and trunk control. Patient does exhibit increased stiffness in BLE with increased knee extension and  posterior trunk lean. This led to patient sliding forward in chair several times throughout session. She is max A +3 for repositioning in chair as patient has difficulty lifting up and scooting posteriorly herself. Patient and caregiver educated about importance of patient sitting up in chair more to help improve trunk control. Caregiver verbalized understanding. She would benefit from additional skilled PT intervention to improve strength, trunk control and overall mobility;    Personal Factors and Comorbidities Age;Time since onset of injury/illness/exacerbation;Comorbidity 3+;Past/Current Experience;Education;Social Background;Transportation;Sex;Fitness    Comorbidities diastolic congestive heart failure, diabetes mellitus, diabetic neuropathy, hypertension, hyperlipidemia, GERD, peripheral vascular disease, CVA. on 9/2 had amputation of L foot second toe MTP joint and LLE revascularization, recent hx of COVID19    Examination-Activity Limitations Bathing;Hygiene/Grooming;Squat;Bed Mobility;Stairs;Lift;Bend;Locomotion Level;Stand;Caring for [employer];Toileting;Carry;Self Feeding;Transfers;Continence;Dressing;Other;Sit    Examination-Participation Restrictions Cleaning;Shop;Community Activity;Meal Prep;Driving;Other    Stability/Clinical Decision Making Evolving/Moderate complexity    Rehab Potential Poor    PT Frequency 2x / week    PT Duration 12 weeks    PT Treatment/Interventions ADLs/Self Care Home Management;DME Instruction;Functional mobility training;Therapeutic activities;Therapeutic exercise;Neuromuscular re-education;Patient/family education;Wheelchair mobility training;Passive range of motion;Energy conservation;Cryotherapy;Biofeedback;Electrical Stimulation;Iontophoresis 64m/ml Dexamethasone;Moist Heat;Traction;Ultrasound;Stair training;Cognitive remediation;Balance training;Orthotic Fit/Training;Manual techniques;Manual lymph drainage;Splinting;Taping;Vasopneumatic  Device;Vestibular;Visual/perceptual remediation/compensation;Gait training    PT Next Visit Plan bed mobility, pressure relief techniques;    PT Home Exercise Plan Previous- Seated lateral weight shift pressure relief technique in  wheelchair to be performed 5 days/week for 2 sets of 10 repetitions each side.  PT provided patient with picture of technique as well.  05/21/2021- Access Code: [email or web address]    Consulted and Agree with Plan of Care Family member/caregiver    Family Member Consulted Spouse             Patient will benefit from skilled therapeutic intervention in order to improve the following deficits and impairments:  Decreased cognition, Decreased skin integrity, Impaired sensation, Decreased mobility, Postural dysfunction, Decreased activity tolerance, Decreased endurance, Decreased range of motion, Decreased strength, Decreased balance, Decreased safety awareness, Increased edema, Impaired flexibility, Decreased coordination, Cardiopulmonary status limiting activity, Abnormal gait, Impaired UE functional use, Impaired tone, Improper body mechanics, Pain, Difficulty walking, Decreased knowledge of use of DME, Hypomobility, Impaired perceived functional ability  Visit Diagnosis: Abnormality of gait and mobility  Difficulty in walking, not elsewhere classified  Unsteadiness on feet  Other lack of coordination  Muscle weakness (generalized)  Other abnormalities of gait and mobility     Problem List Patient Active Problem List   Diagnosis Date Noted   Atherosclerosis of native arteries of extremity with rest pain ([place]) 04/20/2021   COVID-19 virus infection 03/07/2021   Acute febrile illness 03/07/2021   Hypoalbuminemia 03/07/2021   Hyperglycemia due to diabetes mellitus ([place]) 03/07/2021   Elevated troponin 03/07/2021   Constipation 03/07/2021   COPD (chronic obstructive pulmonary disease) ([place]) 10/20/2020   GERD (gastroesophageal reflux disease) 10/20/2020   Physical  deconditioning 10/20/2020   Cellulitis 10/19/2020   Necrotic toes (HCC)    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)    Diabetic neuropathy (HCC)    Gangrene of toe of both feet ([place]) 06/06/2019   Peripheral arterial disease ([place]) 04/27/2019   ESBL (extended spectrum beta-lactamase) producing bacteria infection 12/25/2018   PVD (peripheral vascular disease) ([place]) 09/12/2016   Ulcer of right lower extremity ([place]) 08/29/2016   Acute on chronic diastolic heart failure ([place]) 07/08/2016   HTN (hypertension) 07/05/2016   Sensorineural hearing loss (SNHL), 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   Chronic diastolic CHF (congestive heart failure) ([place]) 03/10/2016   HLD (hyperlipidemia) 03/09/2016   Acute respiratory acidosis (HCC) 03/05/2016   Anemia, iron deficiency 03/03/2016   Cerebrovascular accident (CVA) due to thrombosis of precerebral artery ([place]) 03/01/2016   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 ([employer]) 03/01/2016   Atherosclerosis of native arteries of the extremities with gangrene ([place]) 12/08/2015   Anemia, chronic disease 04/27/2014   Unspecified hereditary and idiopathic peripheral neuropathy 03/14/2014   DM2 (diabetes mellitus, type 2) ([place]) 02/14/2014   Aortic stenosis 02/04/2014   Closed left subtrochanteric femur fracture ([place]) 02/03/2014   Hip fracture, left ([place]) 02/03/2014   Fibula fracture 02/03/2014   MTP instability 02/03/2014   Hip fracture ([place]) 02/03/2014    Danity Schmelzer, PT, DPT 06/27/2021, 9:06 AM  [address] [place], [place], [address] Phone: [phone]   Fax:  [phone]  Name: KEVA DARTY MRN: [id number] Date of Birth: 09-Jan-1941

## 2021-07-02 ENCOUNTER — Ambulatory Visit: Payer: Medicare HMO

## 2021-07-03 ENCOUNTER — Ambulatory Visit: Payer: Medicare HMO

## 2021-07-03 ENCOUNTER — Other Ambulatory Visit: Payer: Self-pay

## 2021-07-03 DIAGNOSIS — R2681 Unsteadiness on feet: Secondary | ICD-10-CM

## 2021-07-03 DIAGNOSIS — R269 Unspecified abnormalities of gait and mobility: Secondary | ICD-10-CM | POA: Diagnosis not present

## 2021-07-03 DIAGNOSIS — M6281 Muscle weakness (generalized): Secondary | ICD-10-CM

## 2021-07-03 DIAGNOSIS — R262 Difficulty in walking, not elsewhere classified: Secondary | ICD-10-CM

## 2021-07-03 NOTE — Therapy (Signed)
[place] MAIN [facility] SERVICES [address] [place], [place], [address] Phone: [phone]   Fax:  [phone]  Physical Therapy Treatment  Patient Details  Name: [name] MRN: [id number] Date of Birth: [birth year] Referring Provider (PT): Sharin Grave   Encounter Date: 07/03/2021   PT End of Session - 07/03/21 1846     Visit Number 15    Number of Visits 25    Date for PT Re-Evaluation 08/02/21    Authorization Type [employer]; Medicaid    Authorization Time Period 05/10/21-08/02/21    Progress Note Due on Visit 37    PT Start Time 1300    PT Stop Time 1344    PT Time Calculation (min) 44 min    Equipment Utilized During Treatment Gait belt    Activity Tolerance Patient tolerated treatment well;Patient limited by fatigue;Patient limited by pain    Behavior During Therapy Flat affect             Past Medical History:  Diagnosis Date   Acute heart failure ([place])    Aortic stenosis    CHF (congestive heart failure) (HCC)    Complication of anesthesia    Hard to wake patient up after having anesthesia   Diabetes mellitus without complication ([place])    Diabetic neuropathy ([place])    Takes Lyrica   Edema of both legs    Takes Lasix   GERD (gastroesophageal reflux disease)    High cholesterol    HTN (hypertension)    Hypertension    PAD (peripheral artery disease) ([place])    Shortness of breath dyspnea    Spasm of back muscles    Stroke ([place])    Wound, open    Right great toe    Past Surgical History:  Procedure Laterality Date   AMPUTATION TOE Left 06/09/2019   Procedure: Left third toe and partial great toe amputation and debridement;  Surgeon: Katha Cabal, MD;  Location: [facility];  Service: Vascular;  Laterality: Left;   AMPUTATION TOE Left 04/06/2020   Procedure: AMPUTATION LEFT SECOND TOE;  Surgeon: Samara Deist, DPM;  Location: [facility];  Service: Podiatry;  Laterality: Left;   BYPASS GRAFT POPLITEAL TO  TIBIAL Right 02/28/2016   Procedure: BYPASS GRAFT RIGHT BELOW KNEE POPLITEAL TO PERONEAL USING REVERSED RIGHT GREATER SAPHENOUS VEIN;  Surgeon: Conrad [place], MD;  Location: [place];  Service: Vascular;  Laterality: Right;   CYST EXCISION     Abdomen   EYE SURGERY Bilateral    Cataract removal   INTRAMEDULLARY (IM) NAIL INTERTROCHANTERIC Left 02/04/2014   Procedure: INTRAMEDULLARY (IM) NAIL INTERTROCHANTRIC FEMORAL;  Surgeon: Mauri Pole, MD;  Location: [place];  Service: Orthopedics;  Laterality: Left;   IR GENERIC HISTORICAL  03/01/2016   IR ANGIO INTRA EXTRACRAN SEL COM CAROTID INNOMINATE UNI R MOD SED 03/01/2016 [name], MD [facility]   IR GENERIC HISTORICAL  03/01/2016   IR ENDOVASC INTRACRANIAL INF OTHER THAN THROMBO ART INC DIAG ANGIO 03/01/2016 [name], MD [facility]   IR GENERIC HISTORICAL  03/01/2016   IR INTRAVSC STENT CERV CAROTID W/O EMB-PROT MOD SED INC ANGIO 03/01/2016 [name], MD [facility]   IR GENERIC HISTORICAL  06/03/2016   IR RADIOLOGIST EVAL & MGMT 06/03/2016 [facility]   LOWER EXTREMITY ANGIOGRAPHY Left 02/09/2019   Procedure: LOWER EXTREMITY ANGIOGRAPHY;  Surgeon: Katha Cabal, MD;  Location: [place] [facility];  Service: Cardiovascular;  Laterality: Left;   LOWER  EXTREMITY ANGIOGRAPHY Left 03/10/2019   Procedure: LOWER EXTREMITY ANGIOGRAPHY;  Surgeon: Katha Cabal, MD;  Location: [place] [facility];  Service: Cardiovascular;  Laterality: Left;   LOWER EXTREMITY ANGIOGRAPHY Left 04/27/2019   Procedure: LOWER EXTREMITY ANGIOGRAPHY;  Surgeon: Katha Cabal, MD;  Location: [place] [facility];  Service: Cardiovascular;  Laterality: Left;   LOWER EXTREMITY ANGIOGRAPHY Left 06/08/2019   Procedure: Lower Extremity Angiography;  Surgeon: Katha Cabal, MD;  Location: [place] [facility];  Service: Cardiovascular;  Laterality: Left;   LOWER EXTREMITY ANGIOGRAPHY Left 04/03/2020   Procedure: Lower Extremity Angiography;   Surgeon: Algernon Huxley, MD;  Location: [place] [facility];  Service: Cardiovascular;  Laterality: Left;   LOWER EXTREMITY ANGIOGRAPHY Right 10/20/2020   Procedure: Lower Extremity Angiography;  Surgeon: Katha Cabal, MD;  Location: [place] [facility];  Service: Cardiovascular;  Laterality: Right;   LOWER EXTREMITY ANGIOGRAPHY Left 01/23/2021   Procedure: LOWER EXTREMITY ANGIOGRAPHY;  Surgeon: Katha Cabal, MD;  Location: [place] [facility];  Service: Cardiovascular;  Laterality: Left;   LOWER EXTREMITY ANGIOGRAPHY Right 04/10/2021   Procedure: LOWER EXTREMITY ANGIOGRAPHY;  Surgeon: Katha Cabal, MD;  Location: [place] [facility];  Service: Cardiovascular;  Laterality: Right;   ORIF TOE FRACTURE Right 02/08/2014   Procedure: OPEN REDUCTION INTERNAL FIXATION Right METATARSAL  FRACTURE ;  Surgeon: Wylene Simmer, MD;  Location: [place];  Service: Orthopedics;  Laterality: Right;   PERIPHERAL VASCULAR CATHETERIZATION N/A 12/28/2015   Procedure: Abdominal Aortogram w/Lower Extremity;  Surgeon: Conrad [place], MD;  Location: [place] [facility];  Service: Cardiovascular;  Laterality: N/A;   RADIOLOGY WITH ANESTHESIA N/A 03/01/2016   Procedure: RADIOLOGY WITH ANESTHESIA;  Surgeon: [name], MD;  Location: [place];  Service: Radiology;  Laterality: N/A;   VEIN HARVEST Right 02/28/2016   Procedure: RIGHT GREATER SAPHENOUS VEIN HARVEST;  Surgeon: Conrad [place], MD;  Location: [place];  Service: Vascular;  Laterality: Right;    There were no vitals filed for this visit.   Subjective Assessment - 07/03/21 1844     Subjective Patient denies any new complaints today and husband reports she has been up more this week.    Patient is accompained by: Family member    Pertinent History 89yoF presenting to [place] to address recent gradual progressive decline in mobility, family goals for improved strength, to promote > participation in transfers to wheelchair and safety with ADL performance. Recent  illness includes admission to [facility] on 03/07/21-03/10/21 due to COVID infection c AMS and fever. Pt then underwent outpatient recanalization/limb salvage of RLE 04/10/2021; LLE salvage performed in June 2022. Other PMH: CHF, DM, HTN, CVA, COPD, multiple remote toe amputations, stg 2 sacral ulcer. PLOF includes 3-4 years bed bound status, hoyerlifts to WC, totalA for ADL performance. Pt is cared for by family, as well as professional caregiver services.    Limitations Sitting;Walking;Lifting;Standing;House hold activities    How long can you sit comfortably? Pt spouse reports 2 hours. Must be careful d/t preventing and trying to heal wounds on bottom.    How long can you stand comfortably? unable    How long can you walk comfortably? Unable    Diagnostic tests 6/21 and 9/06 peripheral vascular catheterizatoin; 04/10/2021 Successful recanalization right lower extremity for limb salvage    Patient Stated Goals to strengthen pt's trunk to improve mobility in chair, assist with ADLs    Currently in Pain? No/denies    Pain Onset More  than a month ago               Interventions:   Therapeutic Exercises:    Seated in W/C:  Forward trunk lean- 2 sets of 10 reps with B HHA- (patient pulls herself forward while PT holding her hands)  -LAQ, 2 sets of 10 reps each LE ,cueing for sequencing and to slow down LE movement- as patient performs too quickly- she was able to slow down with max VC's. -hip flexion/march, 1x10 each LE; tactile cueing and AAROM for improved range.  Hip add with ball squeeze x 10 reps- VC to squeeze ball.  Hip ABD with only help to direction motion x 10 reps BLE  Ball toss - Patient able to raise, abd, extend arms to catch and throw soft red ball back and forth to PT positioning approx 4 feet away. Patient was able to toss several rounds of 4-5 tosses then would state "I'm tired" but able to continue with brief rest break and redirection.  Kick ball- Patient able to perform 2 rounds of 1  min kick ball back and forth with PT - able to utilize each leg and kick appropriately exhibiting good timing with reaction and kick today.   Pt required brief rest breaks between all above exercises.    Therapeutic activity:  *patient was agreeable to try to stand today so all standing performed with +2 assist of 2 PT's today:  - Sit to stand requiring max assist +2 person with hand under hand technique and use of gait belt- blocking her feet from sliding forward- Patient performed Standing x 2 trials: Each trial between 10-15 sec and Max VC's to stand as erect as possible; observed left knee hyperextending and very limited hip extension yet no report of pain- only fatigue today.    Education provided throughout session via VC/TC and demonstration to facilitate movement at target joints and correct muscle activation for all testing and exercises performed.     Clinical Impression: Patient was alert and mostly cooperative today during entire session. She performed better and more compliant if allowed brief rest break. She was able to able to follow VC well for improving technique with exercises and able to stand again today with max+2 assist. Paitent did not exhibit any increased knee ext or sliding out of chair as seen in previous visit and less overall physical assist to lean forward- patient even observed leaning forward to catch a ball today. She would benefit from additional skilled PT intervention to improve strength, trunk control and overall mobility.                PT Education - 07/03/21 1845     Education Details Purpose of standing to relieve pressure on bottom and benefits of standing for improved strength and endruance and improve ability to transfer    Person(s) Educated Patient    Methods Explanation;Demonstration;Tactile cues;Verbal cues    Comprehension Verbalized understanding;Returned demonstration;Verbal cues required;Need further instruction;Tactile cues  required              PT Short Term Goals - 06/12/21 1358       PT SHORT TERM GOAL #1   Title Patient will be independent in home exercise program to improve strength/mobility for better functional independence with ADLs.    Baseline 10/6: to be initiated, 11/8: caregiver reports she is doing them 3x a day;    Time 6    Period Weeks    Status Partially Met  Target Date 06/21/21               PT Long Term Goals - 06/12/21 1402       PT LONG TERM GOAL #1   Title Patient will increase FOTO score to equal to or greater than 44 to demonstrate statistically significant improvement in mobility and quality of life.    Baseline 10/6: 26, 11/8: 12%    Time 12    Period Weeks    Status Not Met    Target Date 08/02/21      PT LONG TERM GOAL #2   Title Patient will roll L and R in bed with mod A for decreased caregiver burden for ADLs and toileting.    Baseline 10/6: per pt spouse, pt currently dependent, 11/8: mod A, inconsistent    Time 12    Period Weeks    Status Partially Met    Target Date 08/02/21      PT LONG TERM GOAL #3   Title Patient will sit upright in WC, with BUE support and without leaning on backrest for 10 minutes for ADL performance and pressure relief.    Baseline 10/6: pt exhibits core contraction but unable to sit upright, 11/8: able to sit upright for <20 sec with mod VCs for erect positioning    Time 12    Period Weeks    Status Partially Met    Target Date 08/02/21      PT LONG TERM GOAL #4   Title Pt will demonstrate ability to utilize BUEs to weight-shift to each side in WC and maintain weight shift for 30 seconds to assist in pressure relief and reduece risk of wound formation.    Baseline 10/6: pt currently unable, 11/8: unable to initiate weight shift, able to use UE but unable to shift hips without assistance;    Time 12    Period Weeks    Status Partially Met    Target Date 08/02/21                   Plan - 07/03/21 1909      Clinical Impression Statement Patient was alert and mostly cooperative today during entire session. She performed better and more compliant if allowed brief rest break. She was able to able to follow VC well for improving technique with exercises and able to stand again today with max+2 assist. Paitent did not exhibit any increased knee ext or sliding out of chair as seen in previous visit and less overall physical assist to lean forward- patient even observed leaning forward to catch a ball today. She would benefit from additional skilled PT intervention to improve strength, trunk control and overall mobility.    Personal Factors and Comorbidities Age;Time since onset of injury/illness/exacerbation;Comorbidity 3+;Past/Current Experience;Education;Social Background;Transportation;Sex;Fitness    Comorbidities diastolic congestive heart failure, diabetes mellitus, diabetic neuropathy, hypertension, hyperlipidemia, GERD, peripheral vascular disease, CVA. on 9/2 had amputation of L foot second toe MTP joint and LLE revascularization, recent hx of COVID19    Examination-Activity Limitations Bathing;Hygiene/Grooming;Squat;Bed Mobility;Stairs;Lift;Bend;Locomotion Level;Stand;Caring for [employer];Toileting;Carry;Self Feeding;Transfers;Continence;Dressing;Other;Sit    Examination-Participation Restrictions Cleaning;Shop;Community Activity;Meal Prep;Driving;Other    Stability/Clinical Decision Making Evolving/Moderate complexity    Rehab Potential Poor    PT Frequency 2x / week    PT Duration 12 weeks    PT Treatment/Interventions ADLs/Self Care Home Management;DME Instruction;Functional mobility training;Therapeutic activities;Therapeutic exercise;Neuromuscular re-education;Patient/family education;Wheelchair mobility training;Passive range of motion;Energy conservation;Cryotherapy;Biofeedback;Electrical Stimulation;Iontophoresis 54m/ml Dexamethasone;Moist Heat;Traction;Ultrasound;Stair  training;Cognitive remediation;Balance training;Orthotic Fit/Training;Manual techniques;Manual lymph drainage;Splinting;Taping;Vasopneumatic Device;Vestibular;Visual/perceptual  remediation/compensation;Gait training    PT Next Visit Plan bed mobility, pressure relief techniques;    PT Home Exercise Plan Previous- Seated lateral weight shift pressure relief technique in wheelchair to be performed 5 days/week for 2 sets of 10 repetitions each side.  PT provided patient with picture of technique as well.  05/21/2021- Access Code: [email or web address]    Consulted and Agree with Plan of Care Family member/caregiver    Family Member Consulted Spouse             Patient will benefit from skilled therapeutic intervention in order to improve the following deficits and impairments:  Decreased cognition, Decreased skin integrity, Impaired sensation, Decreased mobility, Postural dysfunction, Decreased activity tolerance, Decreased endurance, Decreased range of motion, Decreased strength, Decreased balance, Decreased safety awareness, Increased edema, Impaired flexibility, Decreased coordination, Cardiopulmonary status limiting activity, Abnormal gait, Impaired UE functional use, Impaired tone, Improper body mechanics, Pain, Difficulty walking, Decreased knowledge of use of DME, Hypomobility, Impaired perceived functional ability  Visit Diagnosis: Abnormality of gait and mobility  Difficulty in walking, not elsewhere classified  Muscle weakness (generalized)  Unsteadiness on feet     Problem List Patient Active Problem List   Diagnosis Date Noted   Atherosclerosis of native arteries of extremity with rest pain ([place]) 04/20/2021   COVID-19 virus infection 03/07/2021   Acute febrile illness 03/07/2021   Hypoalbuminemia 03/07/2021   Hyperglycemia due to diabetes mellitus ([place]) 03/07/2021   Elevated troponin 03/07/2021   Constipation 03/07/2021   COPD (chronic obstructive pulmonary disease) ([place]) 10/20/2020    GERD (gastroesophageal reflux disease) 10/20/2020   Physical deconditioning 10/20/2020   Cellulitis 10/19/2020   Necrotic toes (HCC)    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)    Diabetic neuropathy (HCC)    Gangrene of toe of both feet ([place]) 06/06/2019   Peripheral arterial disease ([place]) 04/27/2019   ESBL (extended spectrum beta-lactamase) producing bacteria infection 12/25/2018   PVD (peripheral vascular disease) ([place]) 09/12/2016   Ulcer of right lower extremity ([place]) 08/29/2016   Acute on chronic diastolic heart failure ([place]) 07/08/2016   HTN (hypertension) 07/05/2016   Sensorineural hearing loss (SNHL), 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   Chronic diastolic CHF (congestive heart failure) ([place]) 03/10/2016   HLD (hyperlipidemia) 03/09/2016   Acute respiratory acidosis (HCC) 03/05/2016   Anemia, iron deficiency 03/03/2016   Cerebrovascular accident (CVA) due to thrombosis of precerebral artery ([place]) 03/01/2016   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 MAIN [facility] SERVICES [address] [place], [place], [address] Phone: [phone]   Fax:  [phone]  Name: NIEVES CHAPA MRN: [id number] Date of Birth: 08/10/1940

## 2021-07-04 ENCOUNTER — Ambulatory Visit: Payer: Medicare HMO | Admitting: Physical Therapy

## 2021-07-05 ENCOUNTER — Ambulatory Visit: Payer: Medicare HMO | Attending: Family Medicine | Admitting: Physical Therapy

## 2021-07-05 ENCOUNTER — Encounter: Payer: Self-pay | Admitting: Physical Therapy

## 2021-07-05 ENCOUNTER — Other Ambulatory Visit: Payer: Self-pay

## 2021-07-05 DIAGNOSIS — R2681 Unsteadiness on feet: Secondary | ICD-10-CM | POA: Insufficient documentation

## 2021-07-05 DIAGNOSIS — M6281 Muscle weakness (generalized): Secondary | ICD-10-CM | POA: Diagnosis present

## 2021-07-05 DIAGNOSIS — R262 Difficulty in walking, not elsewhere classified: Secondary | ICD-10-CM | POA: Diagnosis present

## 2021-07-05 DIAGNOSIS — R269 Unspecified abnormalities of gait and mobility: Secondary | ICD-10-CM | POA: Diagnosis present

## 2021-07-05 DIAGNOSIS — R2689 Other abnormalities of gait and mobility: Secondary | ICD-10-CM | POA: Diagnosis present

## 2021-07-05 DIAGNOSIS — R278 Other lack of coordination: Secondary | ICD-10-CM | POA: Insufficient documentation

## 2021-07-05 NOTE — Therapy (Signed)
[facility] MAIN [facility] SERVICES [address] [place], [place], [address] Phone: [phone]   Fax:  [phone]  Physical Therapy Treatment  Patient Details  Name: [name] MRN: [id number] Date of Birth: 06/20/1941 Referring Provider (PT): Sharin Grave   Encounter Date: 07/05/2021   PT End of Session - 07/05/21 0944     Visit Number 16    Number of Visits 25    Date for PT Re-Evaluation 08/02/21    Authorization Type [employer]; Medicaid    Authorization Time Period 05/10/21-08/02/21    Progress Note Due on Visit 77    PT Start Time 0940    PT Stop Time 1020    PT Time Calculation (min) 40 min    Equipment Utilized During Treatment Gait belt    Activity Tolerance Patient tolerated treatment well;Patient limited by fatigue;Patient limited by pain    Behavior During Therapy Flat affect             Past Medical History:  Diagnosis Date   Acute heart failure ([place])    Aortic stenosis    CHF (congestive heart failure) ([place])    Complication of anesthesia    Hard to wake patient up after having anesthesia   Diabetes mellitus without complication ([place])    Diabetic neuropathy ([place])    Takes Lyrica   Edema of both legs    Takes Lasix   GERD (gastroesophageal reflux disease)    High cholesterol    HTN (hypertension)    Hypertension    PAD (peripheral artery disease) ([place])    Shortness of breath dyspnea    Spasm of back muscles    Stroke ([place])    Wound, open    Right great toe    Past Surgical History:  Procedure Laterality Date   AMPUTATION TOE Left 06/09/2019   Procedure: Left third toe and partial great toe amputation and debridement;  Surgeon: Katha Cabal, MD;  Location: [facility];  Service: Vascular;  Laterality: Left;   AMPUTATION TOE Left 04/06/2020   Procedure: AMPUTATION LEFT SECOND TOE;  Surgeon: Samara Deist, DPM;  Location: [facility];  Service: Podiatry;  Laterality: Left;   BYPASS GRAFT POPLITEAL TO  TIBIAL Right 02/28/2016   Procedure: BYPASS GRAFT RIGHT BELOW KNEE POPLITEAL TO PERONEAL USING REVERSED RIGHT GREATER SAPHENOUS VEIN;  Surgeon: Conrad Brady, MD;  Location: [place];  Service: Vascular;  Laterality: Right;   CYST EXCISION     Abdomen   EYE SURGERY Bilateral    Cataract removal   INTRAMEDULLARY (IM) NAIL INTERTROCHANTERIC Left 02/04/2014   Procedure: INTRAMEDULLARY (IM) NAIL INTERTROCHANTRIC FEMORAL;  Surgeon: Mauri Pole, MD;  Location: [name];  Service: Orthopedics;  Laterality: Left;   IR GENERIC HISTORICAL  03/01/2016   IR ANGIO INTRA EXTRACRAN SEL COM CAROTID INNOMINATE UNI R MOD SED 03/01/2016 [name], MD [facility]   IR GENERIC HISTORICAL  03/01/2016   IR ENDOVASC INTRACRANIAL INF OTHER THAN THROMBO ART INC DIAG ANGIO 03/01/2016 [name], MD [facility]   IR GENERIC HISTORICAL  03/01/2016   IR INTRAVSC STENT CERV CAROTID W/O EMB-PROT MOD SED INC ANGIO 03/01/2016 [name], MD [facility]   IR GENERIC HISTORICAL  06/03/2016   IR RADIOLOGIST EVAL & MGMT 06/03/2016 [facility]   LOWER EXTREMITY ANGIOGRAPHY Left 02/09/2019   Procedure: LOWER EXTREMITY ANGIOGRAPHY;  Surgeon: Katha Cabal, MD;  Location: [place] [facility];  Service: Cardiovascular;  Laterality: Left;   LOWER  EXTREMITY ANGIOGRAPHY Left 03/10/2019   Procedure: LOWER EXTREMITY ANGIOGRAPHY;  Surgeon: Katha Cabal, MD;  Location: [place] [facility];  Service: Cardiovascular;  Laterality: Left;   LOWER EXTREMITY ANGIOGRAPHY Left 04/27/2019   Procedure: LOWER EXTREMITY ANGIOGRAPHY;  Surgeon: Katha Cabal, MD;  Location: [place] [facility];  Service: Cardiovascular;  Laterality: Left;   LOWER EXTREMITY ANGIOGRAPHY Left 06/08/2019   Procedure: Lower Extremity Angiography;  Surgeon: Katha Cabal, MD;  Location: [place] [facility];  Service: Cardiovascular;  Laterality: Left;   LOWER EXTREMITY ANGIOGRAPHY Left 04/03/2020   Procedure: Lower Extremity Angiography;   Surgeon: Algernon Huxley, MD;  Location: [place] [facility];  Service: Cardiovascular;  Laterality: Left;   LOWER EXTREMITY ANGIOGRAPHY Right 10/20/2020   Procedure: Lower Extremity Angiography;  Surgeon: Katha Cabal, MD;  Location: [place] [facility];  Service: Cardiovascular;  Laterality: Right;   LOWER EXTREMITY ANGIOGRAPHY Left 01/23/2021   Procedure: LOWER EXTREMITY ANGIOGRAPHY;  Surgeon: Katha Cabal, MD;  Location: [place] [facility];  Service: Cardiovascular;  Laterality: Left;   LOWER EXTREMITY ANGIOGRAPHY Right 04/10/2021   Procedure: LOWER EXTREMITY ANGIOGRAPHY;  Surgeon: Katha Cabal, MD;  Location: [place] [facility];  Service: Cardiovascular;  Laterality: Right;   ORIF TOE FRACTURE Right 02/08/2014   Procedure: OPEN REDUCTION INTERNAL FIXATION Right METATARSAL  FRACTURE ;  Surgeon: Wylene Simmer, MD;  Location: [place];  Service: Orthopedics;  Laterality: Right;   PERIPHERAL VASCULAR CATHETERIZATION N/A 12/28/2015   Procedure: Abdominal Aortogram w/Lower Extremity;  Surgeon: Conrad [place], MD;  Location: [place] [facility];  Service: Cardiovascular;  Laterality: N/A;   RADIOLOGY WITH ANESTHESIA N/A 03/01/2016   Procedure: RADIOLOGY WITH ANESTHESIA;  Surgeon: [name], MD;  Location: [place];  Service: Radiology;  Laterality: N/A;   VEIN HARVEST Right 02/28/2016   Procedure: RIGHT GREATER SAPHENOUS VEIN HARVEST;  Surgeon: Conrad Nowata, MD;  Location: [place];  Service: Vascular;  Laterality: Right;    There were no vitals filed for this visit.   Subjective Assessment - 07/05/21 0941     Subjective patient reports some stomach discomfort. "I feel like my bowels need to move." She also reports leg pain (left);    Patient is accompained by: Family member    Pertinent History 58yoF presenting to [facility] to address recent gradual progressive decline in mobility, family goals for improved strength, to promote > participation in transfers to wheelchair and safety with  ADL performance. Recent illness includes admission to [facility] on 03/07/21-03/10/21 due to COVID infection c AMS and fever. Pt then underwent outpatient recanalization/limb salvage of RLE 04/10/2021; LLE salvage performed in June 2022. Other PMH: CHF, DM, HTN, CVA, COPD, multiple remote toe amputations, stg 2 sacral ulcer. PLOF includes 3-4 years bed bound status, hoyerlifts to WC, totalA for ADL performance. Pt is cared for by family, as well as professional caregiver services.    Limitations Sitting;Walking;Lifting;Standing;House hold activities    How long can you sit comfortably? Pt spouse reports 2 hours. Must be careful d/t preventing and trying to heal wounds on bottom.    How long can you stand comfortably? unable    How long can you walk comfortably? Unable    Diagnostic tests 6/21 and 9/06 peripheral vascular catheterizatoin; 04/10/2021 Successful recanalization right lower extremity for limb salvage    Patient Stated Goals to strengthen pt's trunk to improve mobility in chair, assist with ADLs    Currently in Pain? Yes  Pain Score --   reports " a little pain"   Pain Location Leg    Pain Orientation Left    Pain Descriptors / Indicators Aching;Sore    Pain Type Chronic pain    Pain Onset More than a month ago    Pain Frequency Intermittent    Aggravating Factors  worse with standing/weight bearing    Pain Relieving Factors rest    Effect of Pain on Daily Activities decreased activity tolerance;    Multiple Pain Sites No                    TREATMENT:   Ex: Seated in chair:  Forward weight shift with UE on arm rest and in lap x10 reps with therapist assist. Patient required max A initially but then able to participate in forward weight shift progressing to min A/CGA. She was able to hold forward weight shift for 2-3 sec initially; However after increased repetition, patient progressed to sitting forward in chair unsupported for 50 sec with close supervision;    Forward sitting  with arms in lap:  2lb ankle weights: -LAQ, x20 each LE ,cueing for sequencing and to slow down LE movement and increase AROM with full extension and flexion;  -hip flexion/march, 1x10 each LE; tactile cueing and AAROM for improved range. Patient able to initiate movement but only able to achieve partial range due to weakness;    Therapeutic activities: PT positioned patient in parallel bars with bars lowered to appropriate height Therapist in front of patient Instructed patient in sit<>Stand X1 rep- patient reports increased LLE knee pain with difficulty shifting forward, max A +1 for 2-3 sec X2 rep- patient able to shift weight forward better with max A+1 and hold standing for 5 sec with better erect posture  Patient was dependent for repositioning back in chair She also required +3 assist for repositioning and fixing chair cushion for better safety in chair;     Pt required occasional rest breaks due fatigue; she often falls backward in chair with fatigue requiring cues to pull self up to erect posture.  Patient was dependent +2 for repositioning in chair for better safety. She was able to progress in session with better sitting balance; Patient was able to progress standing position with less posterior lean and reporting less discomfort; She initially had increased LLE knee pain but this was alleviated with short seated rest break with moist heat.                                 PT Education - 07/05/21 1030     Education Details positioning, strengthening;    Person(s) Educated Patient;Caregiver(s)    Methods Explanation;Demonstration;Verbal cues    Comprehension Verbalized understanding;Returned demonstration;Verbal cues required;Need further instruction              PT Short Term Goals - 06/12/21 1358       PT SHORT TERM GOAL #1   Title Patient will be independent in home exercise program to improve strength/mobility for better functional  independence with ADLs.    Baseline 10/6: to be initiated, 11/8: caregiver reports she is doing them 3x a day;    Time 6    Period Weeks    Status Partially Met    Target Date 06/21/21               PT Long Term Goals - 06/12/21 1402  PT LONG TERM GOAL #1   Title Patient will increase FOTO score to equal to or greater than 44 to demonstrate statistically significant improvement in mobility and quality of life.    Baseline 10/6: 26, 11/8: 12%    Time 12    Period Weeks    Status Not Met    Target Date 08/02/21      PT LONG TERM GOAL #2   Title Patient will roll L and R in bed with mod A for decreased caregiver burden for ADLs and toileting.    Baseline 10/6: per pt spouse, pt currently dependent, 11/8: mod A, inconsistent    Time 12    Period Weeks    Status Partially Met    Target Date 08/02/21      PT LONG TERM GOAL #3   Title Patient will sit upright in WC, with BUE support and without leaning on backrest for 10 minutes for ADL performance and pressure relief.    Baseline 10/6: pt exhibits core contraction but unable to sit upright, 11/8: able to sit upright for <20 sec with mod VCs for erect positioning    Time 12    Period Weeks    Status Partially Met    Target Date 08/02/21      PT LONG TERM GOAL #4   Title Pt will demonstrate ability to utilize BUEs to weight-shift to each side in WC and maintain weight shift for 30 seconds to assist in pressure relief and reduece risk of wound formation.    Baseline 10/6: pt currently unable, 11/8: unable to initiate weight shift, able to use UE but unable to shift hips without assistance;    Time 12    Period Weeks    Status Partially Met    Target Date 08/02/21                   Plan - 07/05/21 1030     Clinical Impression Statement Patient motivated and participated well within session. She was able to progress standing today in parallel bars with max A +1 with max VCs for forward weight shift and UE  placement. She was able to exhibit better forward lean this session with improved trunk control. Patient continues to require verbal and tactile cues for increased AROM of LE strengthening. She did report increased LLE knee pain wiht standing but that was alleviated with moist heat in sitting. Patient is dependent for repositioning in chair. She would benefit from additional skilled PT Intervention to improve strength, ROM and functional mobility;    Personal Factors and Comorbidities Age;Time since onset of injury/illness/exacerbation;Comorbidity 3+;Past/Current Experience;Education;Social Background;Transportation;Sex;Fitness    Comorbidities diastolic congestive heart failure, diabetes mellitus, diabetic neuropathy, hypertension, hyperlipidemia, GERD, peripheral vascular disease, CVA. on 9/2 had amputation of L foot second toe MTP joint and LLE revascularization, recent hx of COVID19    Examination-Activity Limitations Bathing;Hygiene/Grooming;Squat;Bed Mobility;Stairs;Lift;Bend;Locomotion Level;Stand;Caring for [employer];Toileting;Carry;Self Feeding;Transfers;Continence;Dressing;Other;Sit    Examination-Participation Restrictions Cleaning;Shop;Community Activity;Meal Prep;Driving;Other    Stability/Clinical Decision Making Evolving/Moderate complexity    Rehab Potential Poor    PT Frequency 2x / week    PT Duration 12 weeks    PT Treatment/Interventions ADLs/Self Care Home Management;DME Instruction;Functional mobility training;Therapeutic activities;Therapeutic exercise;Neuromuscular re-education;Patient/family education;Wheelchair mobility training;Passive range of motion;Energy conservation;Cryotherapy;Biofeedback;Electrical Stimulation;Iontophoresis 28m/ml Dexamethasone;Moist Heat;Traction;Ultrasound;Stair training;Cognitive remediation;Balance training;Orthotic Fit/Training;Manual techniques;Manual lymph drainage;Splinting;Taping;Vasopneumatic Device;Vestibular;Visual/perceptual  remediation/compensation;Gait training    PT Next Visit Plan bed mobility, pressure relief techniques;    PT Home Exercise Plan Previous- Seated lateral weight shift pressure  relief technique in wheelchair to be performed 5 days/week for 2 sets of 10 repetitions each side.  PT provided patient with picture of technique as well.  05/21/2021- Access Code: [email or web address]    Consulted and Agree with Plan of Care Family member/caregiver    Family Member Consulted Spouse             Patient will benefit from skilled therapeutic intervention in order to improve the following deficits and impairments:  Decreased cognition, Decreased skin integrity, Impaired sensation, Decreased mobility, Postural dysfunction, Decreased activity tolerance, Decreased endurance, Decreased range of motion, Decreased strength, Decreased balance, Decreased safety awareness, Increased edema, Impaired flexibility, Decreased coordination, Cardiopulmonary status limiting activity, Abnormal gait, Impaired UE functional use, Impaired tone, Improper body mechanics, Pain, Difficulty walking, Decreased knowledge of use of DME, Hypomobility, Impaired perceived functional ability  Visit Diagnosis: Abnormality of gait and mobility  Difficulty in walking, not elsewhere classified  Muscle weakness (generalized)  Unsteadiness on feet  Other lack of coordination  Other abnormalities of gait and mobility     Problem List Patient Active Problem List   Diagnosis Date Noted   Atherosclerosis of native arteries of extremity with rest pain ([place]) 04/20/2021   COVID-19 virus infection 03/07/2021   Acute febrile illness 03/07/2021   Hypoalbuminemia 03/07/2021   Hyperglycemia due to diabetes mellitus ([place]) 03/07/2021   Elevated troponin 03/07/2021   Constipation 03/07/2021   COPD (chronic obstructive pulmonary disease) ([place]) 10/20/2020   GERD (gastroesophageal reflux disease) 10/20/2020   Physical deconditioning 10/20/2020   Cellulitis  10/19/2020   Necrotic toes (HCC)    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)    Diabetic neuropathy (H[facility])    Gangrene of toe of both feet ([place]) 06/06/2019   Peripheral arterial disease ([place]) 04/27/2019   ESBL (extended spectrum beta-lactamase) producing bacteria infection 12/25/2018   PVD (peripheral vascular disease) ([place]) 09/12/2016   Ulcer of right lower extremity ([place]) 08/29/2016   Acute on chronic diastolic heart failure ([place]) 07/08/2016   HTN (hypertension) 07/05/2016   Sensorineural hearing loss (SNHL), 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   Chronic diastolic CHF (congestive heart failure) ([place]) 03/10/2016   HLD (hyperlipidemia) 03/09/2016   Acute respiratory acidosis (HCC) 03/05/2016   Anemia, iron deficiency 03/03/2016   Cerebrovascular accident (CVA) due to thrombosis of precerebral artery ([place]) 03/01/2016   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 ([place]) 03/01/2016   Atherosclerosis of native arteries of the extremities with gangrene ([place]) 12/08/2015   Anemia, chronic disease 04/27/2014   Unspecified hereditary and idiopathic peripheral neuropathy 03/14/2014   DM2 (diabetes mellitus, type 2) ([place]) 02/14/2014   Aortic stenosis 02/04/2014   Closed left subtrochanteric femur fracture ([place]) 02/03/2014   Hip fracture, left ([place]) 02/03/2014   Fibula fracture 02/03/2014   MTP instability 02/03/2014   Hip fracture ([place]) 02/03/2014    Zakayla Martinec, PT, DPT 07/05/2021, 11:00 AM  [place], [place], [address] Phone: [phone]   Fax:  [phone]  Name: [name] MRN: [id number] Date of Birth: 07/05/1941

## 2021-07-10 ENCOUNTER — Other Ambulatory Visit: Payer: Self-pay

## 2021-07-10 ENCOUNTER — Ambulatory Visit: Payer: Medicare HMO

## 2021-07-10 DIAGNOSIS — M6281 Muscle weakness (generalized): Secondary | ICD-10-CM

## 2021-07-10 DIAGNOSIS — R269 Unspecified abnormalities of gait and mobility: Secondary | ICD-10-CM | POA: Diagnosis not present

## 2021-07-10 DIAGNOSIS — R2681 Unsteadiness on feet: Secondary | ICD-10-CM

## 2021-07-10 DIAGNOSIS — R262 Difficulty in walking, not elsewhere classified: Secondary | ICD-10-CM

## 2021-07-10 NOTE — Therapy (Signed)
[place] MAIN [facility] SERVICES [address] [place], [place], [address] Phone: [phone]   Fax:  [phone]  Physical Therapy Treatment  Patient Details  Name: MALKY RUDZINSKI MRN: [id number] Date of Birth: 17-Feb-1941 Referring Provider (PT): Sharin Grave   Encounter Date: 07/10/2021   PT End of Session - 07/10/21 1433     Visit Number 17    Number of Visits 25    Date for PT Re-Evaluation 08/02/21    Authorization Type [employer]; Medicaid    Authorization Time Period 05/10/21-08/02/21    Progress Note Due on Visit 20    PT Start Time 1345    PT Stop Time 1429    PT Time Calculation (min) 44 min    Equipment Utilized During Treatment Gait belt    Activity Tolerance Patient tolerated treatment well;Patient limited by fatigue;Patient limited by pain    Behavior During Therapy Flat affect             Past Medical History:  Diagnosis Date   Acute heart failure ([place])    Aortic stenosis    CHF (congestive heart failure) (HCC)    Complication of anesthesia    Hard to wake patient up after having anesthesia   Diabetes mellitus without complication ([place])    Diabetic neuropathy ([place])    Takes Lyrica   Edema of both legs    Takes Lasix   GERD (gastroesophageal reflux disease)    High cholesterol    HTN (hypertension)    Hypertension    PAD (peripheral artery disease) ([place])    Shortness of breath dyspnea    Spasm of back muscles    Stroke ([place])    Wound, open    Right great toe    Past Surgical History:  Procedure Laterality Date   AMPUTATION TOE Left 06/09/2019   Procedure: Left third toe and partial great toe amputation and debridement;  Surgeon: Katha Cabal, MD;  Location: [facility];  Service: Vascular;  Laterality: Left;   AMPUTATION TOE Left 04/06/2020   Procedure: AMPUTATION LEFT SECOND TOE;  Surgeon: Samara Deist, DPM;  Location: [facility];  Service: Podiatry;  Laterality: Left;   BYPASS GRAFT POPLITEAL TO  TIBIAL Right 02/28/2016   Procedure: BYPASS GRAFT RIGHT BELOW KNEE POPLITEAL TO PERONEAL USING REVERSED RIGHT GREATER SAPHENOUS VEIN;  Surgeon: Conrad Kerrtown, MD;  Location: [place];  Service: Vascular;  Laterality: Right;   CYST EXCISION     Abdomen   EYE SURGERY Bilateral    Cataract removal   INTRAMEDULLARY (IM) NAIL INTERTROCHANTERIC Left 02/04/2014   Procedure: INTRAMEDULLARY (IM) NAIL INTERTROCHANTRIC FEMORAL;  Surgeon: Mauri Pole, MD;  Location: [place];  Service: Orthopedics;  Laterality: Left;   IR GENERIC HISTORICAL  03/01/2016   IR ANGIO INTRA EXTRACRAN SEL COM CAROTID INNOMINATE UNI R MOD SED 03/01/2016 [name], MD [facility]   IR GENERIC HISTORICAL  03/01/2016   IR ENDOVASC INTRACRANIAL INF OTHER THAN THROMBO ART INC DIAG ANGIO 03/01/2016 [name], MD [facility]   IR GENERIC HISTORICAL  03/01/2016   IR INTRAVSC STENT CERV CAROTID W/O EMB-PROT MOD SED INC ANGIO 03/01/2016 [name], MD [facility]   IR GENERIC HISTORICAL  06/03/2016   IR RADIOLOGIST EVAL & MGMT 06/03/2016 [facility]   LOWER EXTREMITY ANGIOGRAPHY Left 02/09/2019   Procedure: LOWER EXTREMITY ANGIOGRAPHY;  Surgeon: Katha Cabal, MD;  Location: [place] [facility];  Service: Cardiovascular;  Laterality: Left;   LOWER  EXTREMITY ANGIOGRAPHY Left 03/10/2019   Procedure: LOWER EXTREMITY ANGIOGRAPHY;  Surgeon: Katha Cabal, MD;  Location: [place] [facility];  Service: Cardiovascular;  Laterality: Left;   LOWER EXTREMITY ANGIOGRAPHY Left 04/27/2019   Procedure: LOWER EXTREMITY ANGIOGRAPHY;  Surgeon: Katha Cabal, MD;  Location: [place] [facility];  Service: Cardiovascular;  Laterality: Left;   LOWER EXTREMITY ANGIOGRAPHY Left 06/08/2019   Procedure: Lower Extremity Angiography;  Surgeon: Katha Cabal, MD;  Location: [place] [facility];  Service: Cardiovascular;  Laterality: Left;   LOWER EXTREMITY ANGIOGRAPHY Left 04/03/2020   Procedure: Lower Extremity Angiography;   Surgeon: Algernon Huxley, MD;  Location: [place] [facility];  Service: Cardiovascular;  Laterality: Left;   LOWER EXTREMITY ANGIOGRAPHY Right 10/20/2020   Procedure: Lower Extremity Angiography;  Surgeon: Katha Cabal, MD;  Location: [place] [facility];  Service: Cardiovascular;  Laterality: Right;   LOWER EXTREMITY ANGIOGRAPHY Left 01/23/2021   Procedure: LOWER EXTREMITY ANGIOGRAPHY;  Surgeon: Katha Cabal, MD;  Location: [place] [facility];  Service: Cardiovascular;  Laterality: Left;   LOWER EXTREMITY ANGIOGRAPHY Right 04/10/2021   Procedure: LOWER EXTREMITY ANGIOGRAPHY;  Surgeon: Katha Cabal, MD;  Location: [place] [facility];  Service: Cardiovascular;  Laterality: Right;   ORIF TOE FRACTURE Right 02/08/2014   Procedure: OPEN REDUCTION INTERNAL FIXATION Right METATARSAL  FRACTURE ;  Surgeon: Wylene Simmer, MD;  Location: [place];  Service: Orthopedics;  Laterality: Right;   PERIPHERAL VASCULAR CATHETERIZATION N/A 12/28/2015   Procedure: Abdominal Aortogram w/Lower Extremity;  Surgeon: Conrad Hamlet, MD;  Location: [place] [facility];  Service: Cardiovascular;  Laterality: N/A;   RADIOLOGY WITH ANESTHESIA N/A 03/01/2016   Procedure: RADIOLOGY WITH ANESTHESIA;  Surgeon: [name], MD;  Location: [place];  Service: Radiology;  Laterality: N/A;   VEIN HARVEST Right 02/28/2016   Procedure: RIGHT GREATER SAPHENOUS VEIN HARVEST;  Surgeon: Conrad Marshall, MD;  Location: [place];  Service: Vascular;  Laterality: Right;    There were no vitals filed for this visit.   Subjective Assessment - 07/10/21 1432     Subjective Patient's husband and patient report pain since last transfer two days ago. Reports pain in back and stomach but won't give number.    Patient is accompained by: Family member    Pertinent History 77yoF presenting to [place] to address recent gradual progressive decline in mobility, family goals for improved strength, to promote > participation in transfers to wheelchair  and safety with ADL performance. Recent illness includes admission to [facility] on 03/07/21-03/10/21 due to COVID infection c AMS and fever. Pt then underwent outpatient recanalization/limb salvage of RLE 04/10/2021; LLE salvage performed in June 2022. Other PMH: CHF, DM, HTN, CVA, COPD, multiple remote toe amputations, stg 2 sacral ulcer. PLOF includes 3-4 years bed bound status, hoyerlifts to WC, totalA for ADL performance. Pt is cared for by family, as well as professional caregiver services.    Limitations Sitting;Walking;Lifting;Standing;House hold activities    How long can you sit comfortably? Pt spouse reports 2 hours. Must be careful d/t preventing and trying to heal wounds on bottom.    How long can you stand comfortably? unable    How long can you walk comfortably? Unable    Diagnostic tests 6/21 and 9/06 peripheral vascular catheterizatoin; 04/10/2021 Successful recanalization right lower extremity for limb salvage    Patient Stated Goals to strengthen pt's trunk to improve mobility in chair, assist with ADLs    Currently in Pain? Yes  Pain Score --   no number given   Pain Location Back    Pain Descriptors / Indicators Aching                 TREATMENT:   TherEx Seated in chair:  Forward weight shift with UE on arm rest and in lap x5 reps with therapist assist. Patient required max A initially but then able to participate in forward weight shift progressing to min A/CGA.terminated after 5 due to patient refusal      2lb ankle weights: -LAQ 10x each LE ,max cueing for sequencing ; x 2 sets  -hip flexion/march 10x each LE; very challenging for patient, unable to clear table, frequent confusion with LAQ    Dynamic leaning to right and then left side x 10 reps each direction to off load weight. Repetitive VC for technique   Ankle pf 10x each LE, very challenging for LLE   Rainbow ball: -toss with PT, for varying arc of motion and pertubations x 4 minutes -Lateral toss to PT  or  rotational twist and weight shift x 12 reps -adduction squeeze 10x  -overhead raise 10x    Mirror correction for posture x 3 minutes with patient able to self correct  RTB abduction 15x RTB hamstring curl 10x each LE    Therapeutic activities: PT positioned patient in parallel bars with bars lowered to appropriate height Therapist in front of patient Instructed patient in sit<>Stand X1 rep- patient reports increased LLE knee pain with difficulty shifting forward, max A +1 for 10 seconds Second attempt terminated due to patient refusal    Patient was dependent for repositioning back in chair  Pt educated throughout session about proper posture and technique with exercises. Improved exercise technique, movement at target joints, use of target muscles after min to mod verbal, visual, tactile cues.  Patient's session severely limited by pain in back and stomach. Patient refused upright posture, forward trunk leans, and standing after first attempts. Frequent encouragement required for patient for task orientation. She does tolerate seated activities with intermittent rest breaks. She would benefit from additional skilled PT intervention to improve strength, trunk control and overall mobility;                       PT Education - 07/10/21 1432     Education Details positioning, exercise technique, body mechanics    Person(s) Educated Patient    Methods Explanation;Demonstration;Tactile cues;Verbal cues    Comprehension Verbalized understanding;Returned demonstration;Verbal cues required;Tactile cues required              PT Short Term Goals - 06/12/21 1358       PT SHORT TERM GOAL #1   Title Patient will be independent in home exercise program to improve strength/mobility for better functional independence with ADLs.    Baseline 10/6: to be initiated, 11/8: caregiver reports she is doing them 3x a day;    Time 6    Period Weeks    Status Partially Met     Target Date 06/21/21               PT Long Term Goals - 06/12/21 1402       PT LONG TERM GOAL #1   Title Patient will increase FOTO score to equal to or greater than 44 to demonstrate statistically significant improvement in mobility and quality of life.    Baseline 10/6: 26, 11/8: 12%    Time 12  Period Weeks    Status Not Met    Target Date 08/02/21      PT LONG TERM GOAL #2   Title Patient will roll L and R in bed with mod A for decreased caregiver burden for ADLs and toileting.    Baseline 10/6: per pt spouse, pt currently dependent, 11/8: mod A, inconsistent    Time 12    Period Weeks    Status Partially Met    Target Date 08/02/21      PT LONG TERM GOAL #3   Title Patient will sit upright in WC, with BUE support and without leaning on backrest for 10 minutes for ADL performance and pressure relief.    Baseline 10/6: pt exhibits core contraction but unable to sit upright, 11/8: able to sit upright for <20 sec with mod VCs for erect positioning    Time 12    Period Weeks    Status Partially Met    Target Date 08/02/21      PT LONG TERM GOAL #4   Title Pt will demonstrate ability to utilize BUEs to weight-shift to each side in WC and maintain weight shift for 30 seconds to assist in pressure relief and reduece risk of wound formation.    Baseline 10/6: pt currently unable, 11/8: unable to initiate weight shift, able to use UE but unable to shift hips without assistance;    Time 12    Period Weeks    Status Partially Met    Target Date 08/02/21                   Plan - 07/10/21 1442     Clinical Impression Statement Patient's session severely limited by pain in back and stomach. Patient refused upright posture, forward trunk leans, and standing after first attempts. Frequent encouragement required for patient for task orientation. She does tolerate seated activities with intermittent rest breaks. She would benefit from additional skilled PT intervention to  improve strength, trunk control and overall mobility;    Personal Factors and Comorbidities Age;Time since onset of injury/illness/exacerbation;Comorbidity 3+;Past/Current Experience;Education;Social Background;Transportation;Sex;Fitness    Comorbidities diastolic congestive heart failure, diabetes mellitus, diabetic neuropathy, hypertension, hyperlipidemia, GERD, peripheral vascular disease, CVA. on 9/2 had amputation of L foot second toe MTP joint and LLE revascularization, recent hx of COVID19    Examination-Activity Limitations Bathing;Hygiene/Grooming;Squat;Bed Mobility;Stairs;Lift;Bend;Locomotion Level;Stand;Caring for [employer];Toileting;Carry;Self Feeding;Transfers;Continence;Dressing;Other;Sit    Examination-Participation Restrictions Cleaning;Shop;Community Activity;Meal Prep;Driving;Other    Stability/Clinical Decision Making Evolving/Moderate complexity    Rehab Potential Poor    PT Frequency 2x / week    PT Duration 12 weeks    PT Treatment/Interventions ADLs/Self Care Home Management;DME Instruction;Functional mobility training;Therapeutic activities;Therapeutic exercise;Neuromuscular re-education;Patient/family education;Wheelchair mobility training;Passive range of motion;Energy conservation;Cryotherapy;Biofeedback;Electrical Stimulation;Iontophoresis 51m/ml Dexamethasone;Moist Heat;Traction;Ultrasound;Stair training;Cognitive remediation;Balance training;Orthotic Fit/Training;Manual techniques;Manual lymph drainage;Splinting;Taping;Vasopneumatic Device;Vestibular;Visual/perceptual remediation/compensation;Gait training    PT Next Visit Plan bed mobility, pressure relief techniques;    PT Home Exercise Plan Previous- Seated lateral weight shift pressure relief technique in wheelchair to be performed 5 days/week for 2 sets of 10 repetitions each side.  PT provided patient with picture of technique as well.  05/21/2021- Access Code: [email or web address]   Consulted and Agree with Plan of  Care Family member/caregiver    Family Member Consulted Spouse             Patient will benefit from skilled therapeutic intervention in order to improve the following deficits and impairments:  Decreased cognition, Decreased skin integrity, Impaired sensation, Decreased mobility, Postural dysfunction,  Decreased activity tolerance, Decreased endurance, Decreased range of motion, Decreased strength, Decreased balance, Decreased safety awareness, Increased edema, Impaired flexibility, Decreased coordination, Cardiopulmonary status limiting activity, Abnormal gait, Impaired UE functional use, Impaired tone, Improper body mechanics, Pain, Difficulty walking, Decreased knowledge of use of DME, Hypomobility, Impaired perceived functional ability  Visit Diagnosis: Abnormality of gait and mobility  Difficulty in walking, not elsewhere classified  Muscle weakness (generalized)  Unsteadiness on feet     Problem List Patient Active Problem List   Diagnosis Date Noted   Atherosclerosis of native arteries of extremity with rest pain ([place]) 04/20/2021   COVID-19 virus infection 03/07/2021   Acute febrile illness 03/07/2021   Hypoalbuminemia 03/07/2021   Hyperglycemia due to diabetes mellitus ([place]) 03/07/2021   Elevated troponin 03/07/2021   Constipation 03/07/2021   COPD (chronic obstructive pulmonary disease) ([place]) 10/20/2020   GERD (gastroesophageal reflux disease) 10/20/2020   Physical deconditioning 10/20/2020   Cellulitis 10/19/2020   Necrotic toes (HCC)    Insulin dependent diabetes mellitus type IA ([place])    Diabetic neuropathy (HCC)    Gangrene of toe of both feet ([place]) 06/06/2019   Peripheral arterial disease ([place]) 04/27/2019   ESBL (extended spectrum beta-lactamase) producing bacteria infection 12/25/2018   PVD (peripheral vascular disease) ([place]) 09/12/2016   Ulcer of right lower extremity ([place]) 08/29/2016   Acute on chronic diastolic heart failure ([place]) 07/08/2016   HTN  (hypertension) 07/05/2016   Sensorineural hearing loss (SNHL), 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   Chronic diastolic CHF (congestive heart failure) ([place]) 03/10/2016   HLD (hyperlipidemia) 03/09/2016   Acute respiratory acidosis (HCC) 03/05/2016   Anemia, iron deficiency 03/03/2016   Cerebrovascular accident (CVA) due to thrombosis of precerebral artery ([place]) 03/01/2016   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 ([place]) 03/01/2016   Atherosclerosis of native arteries of the extremities with gangrene ([place]) 12/08/2015   Anemia, chronic disease 04/27/2014   Unspecified hereditary and idiopathic peripheral neuropathy 03/14/2014   DM2 (diabetes mellitus, type 2) ([place]) 02/14/2014   Aortic stenosis 02/04/2014   Closed left subtrochanteric femur fracture ([place]) 02/03/2014   Hip fracture, left ([place]) 02/03/2014   Fibula fracture 02/03/2014   MTP instability 02/03/2014   Hip fracture ([place]) 02/03/2014    Janna Arch, PT, DPT  07/10/2021, 2:43 PM  [facility] MAIN [facility] SERVICES [address] [place], [place], [address] Phone: [phone]   Fax:  [phone]  Name: AMRITA RADU MRN: [id number] Date of Birth: 03-Aug-1941

## 2021-07-12 ENCOUNTER — Ambulatory Visit: Payer: Medicare HMO

## 2021-07-13 ENCOUNTER — Ambulatory Visit
Admission: RE | Admit: 2021-07-13 | Discharge: 2021-07-13 | Disposition: A | Discharge status: Home / Self Care | Payer: Medicare HMO | Source: Ambulatory Visit | Attending: Family Medicine | Admitting: Family Medicine

## 2021-07-13 DIAGNOSIS — M549 Dorsalgia, unspecified: Secondary | ICD-10-CM | POA: Diagnosis present

## 2021-07-16 ENCOUNTER — Other Ambulatory Visit: Payer: Self-pay

## 2021-07-16 ENCOUNTER — Ambulatory Visit (INDEPENDENT_AMBULATORY_CARE_PROVIDER_SITE_OTHER): Payer: Medicare HMO

## 2021-07-16 ENCOUNTER — Ambulatory Visit (INDEPENDENT_AMBULATORY_CARE_PROVIDER_SITE_OTHER): Payer: Medicare HMO | Admitting: Nurse Practitioner

## 2021-07-16 ENCOUNTER — Encounter (INDEPENDENT_AMBULATORY_CARE_PROVIDER_SITE_OTHER): Payer: Self-pay | Admitting: Nurse Practitioner

## 2021-07-16 VITALS — BP 137/70 | HR 70 | Resp 17 | Ht 65.0 in | Wt 146.0 lb

## 2021-07-16 DIAGNOSIS — I70223 Atherosclerosis of native arteries of extremities with rest pain, bilateral legs: Secondary | ICD-10-CM

## 2021-07-16 DIAGNOSIS — I739 Peripheral vascular disease, unspecified: Secondary | ICD-10-CM

## 2021-07-16 DIAGNOSIS — I10 Essential (primary) hypertension: Secondary | ICD-10-CM | POA: Diagnosis not present

## 2021-07-16 DIAGNOSIS — E782 Mixed hyperlipidemia: Secondary | ICD-10-CM

## 2021-07-17 ENCOUNTER — Ambulatory Visit: Payer: Medicare HMO

## 2021-07-17 DIAGNOSIS — M6281 Muscle weakness (generalized): Secondary | ICD-10-CM

## 2021-07-17 DIAGNOSIS — R262 Difficulty in walking, not elsewhere classified: Secondary | ICD-10-CM

## 2021-07-17 DIAGNOSIS — R269 Unspecified abnormalities of gait and mobility: Secondary | ICD-10-CM

## 2021-07-17 DIAGNOSIS — R2681 Unsteadiness on feet: Secondary | ICD-10-CM

## 2021-07-17 NOTE — Therapy (Signed)
[place] MAIN [facility] SERVICES [address] [place], [place], [address] Phone: [phone]   Fax:  [phone]  Physical Therapy Treatment  Patient Details  Name: [name] MRN: [id number] Date of Birth: May 08, 1941 Referring Provider (PT): Sharin Grave   Encounter Date: 07/17/2021   PT End of Session - 07/17/21 1337     Visit Number 18    Number of Visits 25    Date for PT Re-Evaluation 08/02/21    Authorization Type [employer]; Medicaid    Authorization Time Period 05/10/21-08/02/21    Progress Note Due on Visit 20    PT Start Time 1149    PT Stop Time 1224    PT Time Calculation (min) 35 min    Equipment Utilized During Treatment Gait belt    Activity Tolerance Patient tolerated treatment well;Patient limited by fatigue;Patient limited by pain    Behavior During Therapy Flat affect             Past Medical History:  Diagnosis Date   Acute heart failure ([place])    Aortic stenosis    CHF (congestive heart failure) (HCC)    Complication of anesthesia    Hard to wake patient up after having anesthesia   Diabetes mellitus without complication ([place])    Diabetic neuropathy ([place])    Takes Lyrica   Edema of both legs    Takes Lasix   GERD (gastroesophageal reflux disease)    High cholesterol    HTN (hypertension)    Hypertension    PAD (peripheral artery disease) ([place])    Shortness of breath dyspnea    Spasm of back muscles    Stroke ([place])    Wound, open    Right great toe    Past Surgical History:  Procedure Laterality Date   AMPUTATION TOE Left 06/09/2019   Procedure: Left third toe and partial great toe amputation and debridement;  Surgeon: Katha Cabal, MD;  Location: [facility];  Service: Vascular;  Laterality: Left;   AMPUTATION TOE Left 04/06/2020   Procedure: AMPUTATION LEFT SECOND TOE;  Surgeon: Samara Deist, DPM;  Location: [facility];  Service: Podiatry;  Laterality: Left;   BYPASS GRAFT POPLITEAL TO  TIBIAL Right 02/28/2016   Procedure: BYPASS GRAFT RIGHT BELOW KNEE POPLITEAL TO PERONEAL USING REVERSED RIGHT GREATER SAPHENOUS VEIN;  Surgeon: Conrad , MD;  Location: [place];  Service: Vascular;  Laterality: Right;   CYST EXCISION     Abdomen   EYE SURGERY Bilateral    Cataract removal   INTRAMEDULLARY (IM) NAIL INTERTROCHANTERIC Left 02/04/2014   Procedure: INTRAMEDULLARY (IM) NAIL INTERTROCHANTRIC FEMORAL;  Surgeon: Mauri Pole, MD;  Location: [place];  Service: Orthopedics;  Laterality: Left;   IR GENERIC HISTORICAL  03/01/2016   IR ANGIO INTRA EXTRACRAN SEL COM CAROTID INNOMINATE UNI R MOD SED 03/01/2016 [name], MD [facility]   IR GENERIC HISTORICAL  03/01/2016   IR ENDOVASC INTRACRANIAL INF OTHER THAN THROMBO ART INC DIAG ANGIO 03/01/2016 [name], MD [facility]   IR GENERIC HISTORICAL  03/01/2016   IR INTRAVSC STENT CERV CAROTID W/O EMB-PROT MOD SED INC ANGIO 03/01/2016 [name], MD [facility]   IR GENERIC HISTORICAL  06/03/2016   IR RADIOLOGIST EVAL & MGMT 06/03/2016 [facility]   LOWER EXTREMITY ANGIOGRAPHY Left 02/09/2019   Procedure: LOWER EXTREMITY ANGIOGRAPHY;  Surgeon: Katha Cabal, MD;  Location: [place] [facility];  Service: Cardiovascular;  Laterality: Left;   LOWER  EXTREMITY ANGIOGRAPHY Left 03/10/2019   Procedure: LOWER EXTREMITY ANGIOGRAPHY;  Surgeon: Katha Cabal, MD;  Location: [place] [facility];  Service: Cardiovascular;  Laterality: Left;   LOWER EXTREMITY ANGIOGRAPHY Left 04/27/2019   Procedure: LOWER EXTREMITY ANGIOGRAPHY;  Surgeon: Katha Cabal, MD;  Location: [place] [facility];  Service: Cardiovascular;  Laterality: Left;   LOWER EXTREMITY ANGIOGRAPHY Left 06/08/2019   Procedure: Lower Extremity Angiography;  Surgeon: Katha Cabal, MD;  Location: [place] [facility];  Service: Cardiovascular;  Laterality: Left;   LOWER EXTREMITY ANGIOGRAPHY Left 04/03/2020   Procedure: Lower Extremity Angiography;   Surgeon: Algernon Huxley, MD;  Location: [place] [facility];  Service: Cardiovascular;  Laterality: Left;   LOWER EXTREMITY ANGIOGRAPHY Right 10/20/2020   Procedure: Lower Extremity Angiography;  Surgeon: Katha Cabal, MD;  Location: [place] [facility];  Service: Cardiovascular;  Laterality: Right;   LOWER EXTREMITY ANGIOGRAPHY Left 01/23/2021   Procedure: LOWER EXTREMITY ANGIOGRAPHY;  Surgeon: Katha Cabal, MD;  Location: [place] [facility];  Service: Cardiovascular;  Laterality: Left;   LOWER EXTREMITY ANGIOGRAPHY Right 04/10/2021   Procedure: LOWER EXTREMITY ANGIOGRAPHY;  Surgeon: Katha Cabal, MD;  Location: [place] [facility];  Service: Cardiovascular;  Laterality: Right;   ORIF TOE FRACTURE Right 02/08/2014   Procedure: OPEN REDUCTION INTERNAL FIXATION Right METATARSAL  FRACTURE ;  Surgeon: Wylene Simmer, MD;  Location: [place];  Service: Orthopedics;  Laterality: Right;   PERIPHERAL VASCULAR CATHETERIZATION N/A 12/28/2015   Procedure: Abdominal Aortogram w/Lower Extremity;  Surgeon: Conrad [place], MD;  Location: [place] [facility];  Service: Cardiovascular;  Laterality: N/A;   RADIOLOGY WITH ANESTHESIA N/A 03/01/2016   Procedure: RADIOLOGY WITH ANESTHESIA;  Surgeon: [name], MD;  Location: [place];  Service: Radiology;  Laterality: N/A;   VEIN HARVEST Right 02/28/2016   Procedure: RIGHT GREATER SAPHENOUS VEIN HARVEST;  Surgeon: Conrad [place], MD;  Location: [place];  Service: Vascular;  Laterality: Right;    There were no vitals filed for this visit.   Subjective Assessment - 07/17/21 1316     Subjective Patient reports ongoing low back pain and some left hip pain. Husband reports waiting to hear from MD regarding MRI results and going to call to discuss her left hip pain as well. He reports her hip has an area of swelling.    Patient is accompained by: Family member    Pertinent History 67yoF presenting to [place] to address recent gradual progressive decline in  mobility, family goals for improved strength, to promote > participation in transfers to wheelchair and safety with ADL performance. Recent illness includes admission to [facility] on 03/07/21-03/10/21 due to COVID infection c AMS and fever. Pt then underwent outpatient recanalization/limb salvage of RLE 04/10/2021; LLE salvage performed in June 2022. Other PMH: CHF, DM, HTN, CVA, COPD, multiple remote toe amputations, stg 2 sacral ulcer. PLOF includes 3-4 years bed bound status, hoyerlifts to WC, totalA for ADL performance. Pt is cared for by family, as well as professional caregiver services.    Limitations Sitting;Walking;Lifting;Standing;House hold activities    How long can you sit comfortably? Pt spouse reports 2 hours. Must be careful d/t preventing and trying to heal wounds on bottom.    How long can you stand comfortably? unable    How long can you walk comfortably? Unable    Diagnostic tests 6/21 and 9/06 peripheral vascular catheterizatoin; 04/10/2021 Successful recanalization right lower extremity for limb salvage    Patient Stated  Goals to strengthen pt's trunk to improve mobility in chair, assist with ADLs    Currently in Pain? Yes    Pain Location Back    Pain Orientation Posterior;Lower    Pain Descriptors / Indicators Aching;Sore;Sharp    Pain Type Chronic pain    Pain Onset More than a month ago    Pain Frequency Intermittent    Aggravating Factors  Worse with prolonged sitting/standing    Pain Relieving Factors Rest             INTERVENTIONS:   Therapeutic Exercises:   Seated    -LAQ 10x each LE ,max cueing for sequencing ; x 2 sets (Patient hesitant to lift Left LE but able with only minimal prompting  -hip flexion/march 10x each LE x 2 with AAROM; Max VC for correct technique and to assist in counting out loud.    - AAROM BLE hip abd 2 sets of 10 reps - Hip add with ball squeeze 2 sets of 10 reps     -toss red soft ball with PT, for varying arc of motion and  -Soccer  ball kick (PT rolled ball to patient and she would kick ball back with either leg- Much more difficulty with Left LE)  vs. Right.   Education provided throughout session via VC/TC and demonstration to facilitate movement at target joints and correct muscle activation for all testing and exercises performed.   Therapeutic Activities:   Patient required dep (+2) assist to reposition from slumped posture in chair to more upright. She required assistance 2 times during today's session  Patient session again limited secondary to low back pain- Only performed seated therex as pain free as possible. She was hesitant to perform exercises with left LE but did improve with encouragement, max VC, and reps. She continues to fatigue very quickly and hard of hearing  so have to repeat most of conversation. Patient's husband to contact MD to discuss her ongoing left hip pain and results of MRI. She will benefit from additional skilled PT intervention to improve strength, trunk control and overall mobility                  PT Education - 07/17/21 1337     Education Details Exercise technique, Posture ed    Person(s) Educated Patient;Spouse    Methods Explanation;Demonstration;Tactile cues;Verbal cues    Comprehension Verbalized understanding;Need further instruction;Verbal cues required;Returned demonstration;Tactile cues required              PT Short Term Goals - 06/12/21 1358       PT SHORT TERM GOAL #1   Title Patient will be independent in home exercise program to improve strength/mobility for better functional independence with ADLs.    Baseline 10/6: to be initiated, 11/8: caregiver reports she is doing them 3x a day;    Time 6    Period Weeks    Status Partially Met    Target Date 06/21/21               PT Long Term Goals - 06/12/21 1402       PT LONG TERM GOAL #1   Title Patient will increase FOTO score to equal to or greater than 44 to demonstrate statistically  significant improvement in mobility and quality of life.    Baseline 10/6: 26, 11/8: 12%    Time 12    Period Weeks    Status Not Met    Target Date 08/02/21  PT LONG TERM GOAL #2   Title Patient will roll L and R in bed with mod A for decreased caregiver burden for ADLs and toileting.    Baseline 10/6: per pt spouse, pt currently dependent, 11/8: mod A, inconsistent    Time 12    Period Weeks    Status Partially Met    Target Date 08/02/21      PT LONG TERM GOAL #3   Title Patient will sit upright in WC, with BUE support and without leaning on backrest for 10 minutes for ADL performance and pressure relief.    Baseline 10/6: pt exhibits core contraction but unable to sit upright, 11/8: able to sit upright for <20 sec with mod VCs for erect positioning    Time 12    Period Weeks    Status Partially Met    Target Date 08/02/21      PT LONG TERM GOAL #4   Title Pt will demonstrate ability to utilize BUEs to weight-shift to each side in WC and maintain weight shift for 30 seconds to assist in pressure relief and reduece risk of wound formation.    Baseline 10/6: pt currently unable, 11/8: unable to initiate weight shift, able to use UE but unable to shift hips without assistance;    Time 12    Period Weeks    Status Partially Met    Target Date 08/02/21                   Plan - 07/17/21 1338     Clinical Impression Statement Patient session again limited secondary to low back pain- Only performed seated therex as pain free as possible. She was hesitant to perform exercises with left LE but did improve with encouragement, max VC, and reps. She continues to fatigue very quickly and hard of hearing  so have to repeat most of conversation. Patient's husband to contact MD to discuss her ongoing left hip pain and results of MRI. She will benefit from additional skilled PT intervention to improve strength, trunk control and overall mobility    Personal Factors and Comorbidities  Age;Time since onset of injury/illness/exacerbation;Comorbidity 3+;Past/Current Experience;Education;Social Background;Transportation;Sex;Fitness    Comorbidities diastolic congestive heart failure, diabetes mellitus, diabetic neuropathy, hypertension, hyperlipidemia, GERD, peripheral vascular disease, CVA. on 9/2 had amputation of L foot second toe MTP joint and LLE revascularization, recent hx of COVID19    Examination-Activity Limitations Bathing;Hygiene/Grooming;Squat;Bed Mobility;Stairs;Lift;Bend;Locomotion Level;Stand;Caring for [employer];Toileting;Carry;Self Feeding;Transfers;Continence;Dressing;Other;Sit    Examination-Participation Restrictions Cleaning;Shop;Community Activity;Meal Prep;Driving;Other    Stability/Clinical Decision Making Evolving/Moderate complexity    Rehab Potential Poor    PT Frequency 2x / week    PT Duration 12 weeks    PT Treatment/Interventions ADLs/Self Care Home Management;DME Instruction;Functional mobility training;Therapeutic activities;Therapeutic exercise;Neuromuscular re-education;Patient/family education;Wheelchair mobility training;Passive range of motion;Energy conservation;Cryotherapy;Biofeedback;Electrical Stimulation;Iontophoresis 23m/ml Dexamethasone;Moist Heat;Traction;Ultrasound;Stair training;Cognitive remediation;Balance training;Orthotic Fit/Training;Manual techniques;Manual lymph drainage;Splinting;Taping;Vasopneumatic Device;Vestibular;Visual/perceptual remediation/compensation;Gait training    PT Next Visit Plan bed mobility, pressure relief techniques;    PT Home Exercise Plan Previous- Seated lateral weight shift pressure relief technique in wheelchair to be performed 5 days/week for 2 sets of 10 repetitions each side.  PT provided patient with picture of technique as well.  05/21/2021- Access Code: [email or web address]   Consulted and Agree with Plan of Care Family member/caregiver    Family Member Consulted Spouse             Patient  will benefit from skilled therapeutic intervention in order to improve the following deficits and impairments:  Decreased cognition, Decreased skin integrity, Impaired sensation, Decreased mobility, Postural dysfunction, Decreased activity tolerance, Decreased endurance, Decreased range of motion, Decreased strength, Decreased balance, Decreased safety awareness, Increased edema, Impaired flexibility, Decreased coordination, Cardiopulmonary status limiting activity, Abnormal gait, Impaired UE functional use, Impaired tone, Improper body mechanics, Pain, Difficulty walking, Decreased knowledge of use of DME, Hypomobility, Impaired perceived functional ability  Visit Diagnosis: Abnormality of gait and mobility  Difficulty in walking, not elsewhere classified  Muscle weakness (generalized)  Unsteadiness on feet     Problem List Patient Active Problem List   Diagnosis Date Noted   Atherosclerosis of native arteries of extremity with rest pain ([place]) 04/20/2021   COVID-19 virus infection 03/07/2021   Acute febrile illness 03/07/2021   Hypoalbuminemia 03/07/2021   Hyperglycemia due to diabetes mellitus ([place]) 03/07/2021   Elevated troponin 03/07/2021   Constipation 03/07/2021   COPD (chronic obstructive pulmonary disease) ([place]) 10/20/2020   GERD (gastroesophageal reflux disease) 10/20/2020   Physical deconditioning 10/20/2020   Cellulitis 10/19/2020   Necrotic toes (HCC)    Insulin dependent diabetes mellitus type IA ([place])    Diabetic neuropathy (HCC)    Gangrene of toe of both feet ([place]) 06/06/2019   Peripheral arterial disease ([place]) 04/27/2019   ESBL (extended spectrum beta-lactamase) producing bacteria infection 12/25/2018   PVD (peripheral vascular disease) ([place]) 09/12/2016   Ulcer of right lower extremity ([place]) 08/29/2016   Acute on chronic diastolic heart failure ([place]) 07/08/2016   HTN (hypertension) 07/05/2016   Sensorineural hearing loss (SNHL), 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   Chronic  diastolic CHF (congestive heart failure) ([place]) 03/10/2016   HLD (hyperlipidemia) 03/09/2016   Acute respiratory acidosis (HCC) 03/05/2016   Anemia, iron deficiency 03/03/2016   Cerebrovascular accident (CVA) due to thrombosis of precerebral artery ([place]) 03/01/2016   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 ([place]) 03/01/2016   Atherosclerosis of native arteries of the extremities with gangrene ([place]) 12/08/2015   Anemia, chronic disease 04/27/2014   Unspecified hereditary and idiopathic peripheral neuropathy 03/14/2014   DM2 (diabetes mellitus, type 2) ([place]) 02/14/2014   Aortic stenosis 02/04/2014   Closed left subtrochanteric femur fracture ([place]) 02/03/2014   Hip fracture, left ([place]) 02/03/2014   Fibula fracture 02/03/2014   MTP instability 02/03/2014   Hip fracture ([place]) 02/03/2014    Lewis Moccasin, PT 07/17/2021, 1:54 PM  [place] [facility] [place] [address] [place], [place], [address] Phone: [phone]   Fax:  [phone]  Name: ELLIETTE SEABOLT MRN: [id number] Date of Birth: 03/27/1941

## 2021-07-19 ENCOUNTER — Ambulatory Visit: Payer: Medicare HMO | Admitting: Physical Therapy

## 2021-07-22 ENCOUNTER — Encounter (INDEPENDENT_AMBULATORY_CARE_PROVIDER_SITE_OTHER): Payer: Self-pay | Admitting: Nurse Practitioner

## 2021-07-22 NOTE — Progress Notes (Signed)
Subjective:    Patient ID: [name], female    DOB: May 08, 1941, 80 y.o.   MRN: [id number] Chief Complaint  Patient presents with   Follow-up    Ultrasound    [name] is a 80 year old female that returns today for observation of her known peripheral arterial disease.  The patient herself is a poor historian due to previous strokes and much of her history is done by her husband.  He denies any symptoms consistent with rest pain.  She currently works with physical therapy and has been doing fairly well.  Most recent intervention was in September on her right lower extremity.  The patient also largely does not walk significantly she is mostly wheelchair-bound.  Today it is noted that she has a small wound on her right fourth toe.  It is very superficial and at the toenail.  Today her ABIs are noncompressible with an ABI 1.36 on the right and 1.45 on the left.  The right has monophasic tibial artery waveforms of the left having biphasic/monophasic waveforms.   Review of Systems  Musculoskeletal:  Positive for gait problem.  All other systems reviewed and are negative.     Objective:   Physical Exam Vitals reviewed.  HENT:     Head: Normocephalic.  Cardiovascular:     Rate and Rhythm: Normal rate.     Pulses: Normal pulses.  Pulmonary:     Effort: Pulmonary effort is normal.  Skin:    General: Skin is warm and dry.  Neurological:     Mental Status: She is alert. Mental status is at baseline.  Psychiatric:        Mood and Affect: Mood normal.        Behavior: Behavior normal.        Thought Content: Thought content normal.        Judgment: Judgment normal.    BP 137/70 (BP Location: Right Arm)    Pulse 70    Resp 17    Ht 5\' 5"  (1.651 m)    Wt 146 lb (66.2 kg)    BMI 24.30 kg/m   Past Medical History:  Diagnosis Date   Acute heart failure (HCC)    Aortic stenosis    CHF (congestive heart failure) (HCC)    Complication of anesthesia    Hard to wake patient up after  having anesthesia   Diabetes mellitus without complication (HCC)    Diabetic neuropathy (HCC)    Takes Lyrica   Edema of both legs    Takes Lasix   GERD (gastroesophageal reflux disease)    High cholesterol    HTN (hypertension)    Hypertension    PAD (peripheral artery disease) (HCC)    Shortness of breath dyspnea    Spasm of back muscles    Stroke (HCC)    Wound, open    Right great toe    Social History   Socioeconomic History   Marital status: Married    Spouse name: Not on file   Number of children: Not on file   Years of education: Not on file   Highest education level: Not on file  Occupational History   Not on file  Tobacco Use   Smoking status: Never   Smokeless tobacco: Never   Tobacco comments:    Never smoked  Substance and Sexual Activity   Alcohol use: No    Alcohol/week: 0.0 standard drinks   Drug use: No   Sexual activity: Never  Other  Topics Concern   Not on file  Social History Narrative   Not on file   Social Determinants of Health   Financial Resource Strain: Not on file  Food Insecurity: Not on file  Transportation Needs: Not on file  Physical Activity: Not on file  Stress: Not on file  Social Connections: Not on file  Intimate Partner Violence: Not on file    Past Surgical History:  Procedure Laterality Date   AMPUTATION TOE Left 06/09/2019   Procedure: Left third toe and partial great toe amputation and debridement;  Surgeon: Katha Cabal, MD;  Location: [facility];  Service: Vascular;  Laterality: Left;   AMPUTATION TOE Left 04/06/2020   Procedure: AMPUTATION LEFT SECOND TOE;  Surgeon: Samara Deist, DPM;  Location: [facility];  Service: Podiatry;  Laterality: Left;   BYPASS GRAFT POPLITEAL TO TIBIAL Right 02/28/2016   Procedure: BYPASS GRAFT RIGHT BELOW KNEE POPLITEAL TO PERONEAL USING REVERSED RIGHT GREATER SAPHENOUS VEIN;  Surgeon: Conrad Pembroke, MD;  Location: [place];  Service: Vascular;  Laterality: Right;   CYST EXCISION      Abdomen   EYE SURGERY Bilateral    Cataract removal   INTRAMEDULLARY (IM) NAIL INTERTROCHANTERIC Left 02/04/2014   Procedure: INTRAMEDULLARY (IM) NAIL INTERTROCHANTRIC FEMORAL;  Surgeon: Mauri Pole, MD;  Location: [place];  Service: Orthopedics;  Laterality: Left;   IR GENERIC HISTORICAL  03/01/2016   IR ANGIO INTRA EXTRACRAN SEL COM CAROTID INNOMINATE UNI R MOD SED 03/01/2016 [name], MD [facility]   IR GENERIC HISTORICAL  03/01/2016   IR ENDOVASC INTRACRANIAL INF OTHER THAN THROMBO ART INC DIAG ANGIO 03/01/2016 [name], MD [facility]   IR GENERIC HISTORICAL  03/01/2016   IR INTRAVSC STENT CERV CAROTID W/O EMB-PROT MOD SED INC ANGIO 03/01/2016 [name], MD [facility]   IR GENERIC HISTORICAL  06/03/2016   IR RADIOLOGIST EVAL & MGMT 06/03/2016 [facility]   LOWER EXTREMITY ANGIOGRAPHY Left 02/09/2019   Procedure: LOWER EXTREMITY ANGIOGRAPHY;  Surgeon: Katha Cabal, MD;  Location: [place] [facility];  Service: Cardiovascular;  Laterality: Left;   LOWER EXTREMITY ANGIOGRAPHY Left 03/10/2019   Procedure: LOWER EXTREMITY ANGIOGRAPHY;  Surgeon: Katha Cabal, MD;  Location: [place] [facility];  Service: Cardiovascular;  Laterality: Left;   LOWER EXTREMITY ANGIOGRAPHY Left 04/27/2019   Procedure: LOWER EXTREMITY ANGIOGRAPHY;  Surgeon: Katha Cabal, MD;  Location: [place] [facility];  Service: Cardiovascular;  Laterality: Left;   LOWER EXTREMITY ANGIOGRAPHY Left 06/08/2019   Procedure: Lower Extremity Angiography;  Surgeon: Katha Cabal, MD;  Location: [place] [facility];  Service: Cardiovascular;  Laterality: Left;   LOWER EXTREMITY ANGIOGRAPHY Left 04/03/2020   Procedure: Lower Extremity Angiography;  Surgeon: Algernon Huxley, MD;  Location: [place] [facility];  Service: Cardiovascular;  Laterality: Left;   LOWER EXTREMITY ANGIOGRAPHY Right 10/20/2020   Procedure: Lower Extremity Angiography;  Surgeon: Katha Cabal, MD;  Location: [place] [facility];  Service: Cardiovascular;  Laterality: Right;   LOWER EXTREMITY ANGIOGRAPHY Left 01/23/2021   Procedure: LOWER EXTREMITY ANGIOGRAPHY;  Surgeon: Katha Cabal, MD;  Location: [place] [facility];  Service: Cardiovascular;  Laterality: Left;   LOWER EXTREMITY ANGIOGRAPHY Right 04/10/2021   Procedure: LOWER EXTREMITY ANGIOGRAPHY;  Surgeon: Katha Cabal, MD;  Location: [place] [facility];  Service: Cardiovascular;  Laterality: Right;   ORIF TOE FRACTURE Right 02/08/2014   Procedure: OPEN REDUCTION INTERNAL FIXATION Right METATARSAL  FRACTURE ;  Surgeon: Jenny Reichmann  Doran Durand, MD;  Location: [place];  Service: Orthopedics;  Laterality: Right;   PERIPHERAL VASCULAR CATHETERIZATION N/A 12/28/2015   Procedure: Abdominal Aortogram w/Lower Extremity;  Surgeon: Conrad Seward, MD;  Location: [place] [facility];  Service: Cardiovascular;  Laterality: N/A;   RADIOLOGY WITH ANESTHESIA N/A 03/01/2016   Procedure: RADIOLOGY WITH ANESTHESIA;  Surgeon: [name], MD;  Location: [place];  Service: Radiology;  Laterality: N/A;   VEIN HARVEST Right 02/28/2016   Procedure: RIGHT GREATER SAPHENOUS VEIN HARVEST;  Surgeon: Conrad Tillamook, MD;  Location: [place];  Service: Vascular;  Laterality: Right;    Family History  Problem Relation Age of Onset   Diabetes Other    Liver disease Mother    CVA Father    Diabetes Father    Hypertension Father     Allergies  Allergen Reactions   Iodine Anaphylaxis   Penicillins Anaphylaxis    Tolerates ceftriaxone, cefazolin Did it involve swelling of the face/tongue/throat, SOB, or low BP? Unknown Did it involve sudden or severe rash/hives, skin peeling, or any reaction on the inside of your mouth or nose? Unknown Did you need to seek medical attention at a hospital or doctor's office? Unknown When did it last happen?      Unknown If all above answers are NO, may proceed with cephalosporin use.    Shellfish Allergy Anaphylaxis   Sulfa Antibiotics  Anaphylaxis   Sulfacetamide Sodium Anaphylaxis   Sulfasalazine Anaphylaxis   Eggs Or Egg-Derived Products     SHORTNESS OF BREATH   Morphine And Related Other (See Comments)    Altered mental status    CBC Latest Ref Rng & Units 06/05/2021 03/10/2021 03/09/2021  WBC 4.0 - 10.5 K/uL 9.9 4.3 4.1  Hemoglobin 12.0 - 15.0 g/dL 10.7(L) 9.8(L) 10.5(L)  Hematocrit 36.0 - 46.0 % 33.8(L) 31.4(L) 34.1(L)  Platelets 150 - 400 K/uL 261 148(L) 135(L)      CMP     Component Value Date/Time   NA 139 06/05/2021 1253   K 4.5 06/05/2021 1253   CL 103 06/05/2021 1253   CO2 29 06/05/2021 1253   GLUCOSE 212 (H) 06/05/2021 1253   BUN 16 06/05/2021 1253   CREATININE 0.49 06/05/2021 1253   CALCIUM 9.4 06/05/2021 1253   PROT 6.4 (L) 06/05/2021 1253   ALBUMIN 3.1 (L) 06/05/2021 1253   AST 21 06/05/2021 1253   ALT 22 06/05/2021 1253   ALKPHOS 66 06/05/2021 1253   BILITOT 0.7 06/05/2021 1253   GFRNONAA >60 06/05/2021 1253   GFRAA >60 04/06/2020 0535     VAS [place] ABI WITH/WO TBI  Result Date: 07/19/2021  LOWER EXTREMITY DOPPLER STUDY Patient Name:  CAMBRE MATSON  Date of Exam:   07/16/2021 Medical Rec #: [id number]      Accession #:    [id number] Date of Birth: 1941/06/21       Patient Gender: F Patient Age:   19 years Exam Location:  [facility] Procedure:      VAS [place] ABI WITH/WO TBI Referring Phys: Hortencia Pilar --------------------------------------------------------------------------------  Indications: Peripheral artery disease. Other Factors: 03/10/2019 Left PTA popliteal and ATA.  Vascular Interventions: 04/27/2019: Ultrasound Guided access to the Distal Right                         Anterior Tibial Artery. PTA of the Right SFA and  Popliteal Artery. Crosser Atherectomy with PTA of the                         Right ATA.                         06/08/2019: PTA of the Left SFA and Popliteal Artery.                         PTA of the Left Anterior Tibial Artery.                          06/09/2019: Amputation of the Left First and Third Toe;                         04/10/21: Right SFA/popliteal & ATA                         thrombectomy/PTA/stent;. Comparison Study: 04/16/2021 Performing Technologist: Almira Coaster RVS  Examination Guidelines: A complete evaluation includes at minimum, Doppler waveform signals and systolic blood pressure reading at the level of bilateral brachial, anterior tibial, and posterior tibial arteries, when vessel segments are accessible. Bilateral testing is considered an integral part of a complete examination. Photoelectric Plethysmograph (PPG) waveforms and toe systolic pressure readings are included as required and additional duplex testing as needed. Limited examinations for reoccurring indications may be performed as noted.  ABI Findings: +---------+------------------+-----+----------+------------+  Right     Rt Pressure (mmHg) Index Waveform   Comment       +---------+------------------+-----+----------+------------+  Brachial  138                                               +---------+------------------+-----+----------+------------+  ATA       181                      monophasic 1.31          +---------+------------------+-----+----------+------------+  PTA       187                1.36  monophasic               +---------+------------------+-----+----------+------------+  Great Toe                                     Not Obtained  +---------+------------------+-----+----------+------------+ +---------+------------------+-----+----------+------------+  Left      Lt Pressure (mmHg) Index Waveform   Comment       +---------+------------------+-----+----------+------------+  ATA       200                      biphasic   1.45          +---------+------------------+-----+----------+------------+  PTA       153                1.11  monophasic               +---------+------------------+-----+----------+------------+  Great Toe  Not obtained  +---------+------------------+-----+----------+------------+ +-------+-----------+------------+------------+------------+  ABI/TBI Today's ABI Today's TBI  Previous ABI Previous TBI  +-------+-----------+------------+------------+------------+  Right   1.36        Not Obtained 1.70         N/A           +-------+-----------+------------+------------+------------+  Left    1.45        Not Obtained 1.54         N/A           +-------+-----------+------------+------------+------------+ Bilateral ABIs appear essentially unchanged compared to prior study on 04/16/2021.  Summary: Right: Resting right ankle-brachial index is within normal range. No evidence of significant right lower extremity arterial disease. Left: Resting left ankle-brachial index is within normal range. No evidence of significant left lower extremity arterial disease.  *See table(s) above for measurements and observations.  Electronically signed by Hortencia Pilar MD on 07/19/2021 at 5:34:40 PM.    Final        Assessment & Plan:   1. PAD (peripheral artery disease) ([place]) The patient does have a small wound on her right fourth toe.  It is dry there is no oozing it is not causing the patient any significant pain.  It is very small and superficial.  We will maintain close follow-up and observation wound.  We discussed possible angiogram however at this time the patient's husband wishes for more conservative follow-up.  They are advised to contact us if the wound begins to ooze have a foul-smelling odor  2. Primary hypertension Continue antihypertensive medications as already ordered, these medications have been reviewed and there are no changes at this time.   3. Mixed hyperlipidemia Continue statin as ordered and reviewed, no changes at this time    Current Outpatient Medications on File Prior to Visit  Medication Sig Dispense Refill   acetaminophen (TYLENOL) 500 MG tablet Take 1,000 mg by mouth every 6 (six) hours  as needed for mild pain.     amLODipine (NORVASC) 10 MG tablet Take 10 mg by mouth daily.     aspirin EC 81 MG EC tablet Take 1 tablet (81 mg total) by mouth daily.     atorvastatin (LIPITOR) 20 MG tablet Take 1 tablet (20 mg total) by mouth daily. (Patient taking differently: Take 20 mg by mouth at bedtime.) 30 tablet 11   budesonide (PULMICORT) 0.25 MG/2ML nebulizer solution Take 2 mLs (0.25 mg total) by nebulization 2 (two) times daily. 60 mL 12   carbamide peroxide (DEBROX) 6.5 % OTIC solution Place 5 drops into both ears 2 (two) times daily as needed (ear wax).     Carboxymethylcellul-Glycerin (LUBRICATING EYE DROPS OP) Place 1 drop into both eyes daily as needed (dry eyes).     carvedilol (COREG) 6.25 MG tablet Take 6.25 mg by mouth 2 (two) times daily with a meal.     cetirizine (ZYRTEC) 10 MG tablet Take 10 mg by mouth daily.     cholecalciferol (VITAMIN D3) 25 MCG (1000 UNIT) tablet Take 1,000 Units by mouth daily.     clopidogrel (PLAVIX) 75 MG tablet Take 1 tablet (75 mg total) by mouth daily with breakfast. 30 tablet 0   diclofenac Sodium (VOLTAREN) 1 % GEL Apply 1 application topically 4 (four) times daily as needed (pain).     docusate sodium (COLACE) 100 MG capsule Take 100 mg by mouth daily.     fluticasone (FLONASE) 50 MCG/ACT nasal spray Place 1 spray into both nostrils daily as needed for allergies.  gabapentin (NEURONTIN) 300 MG capsule Take 300 mg by mouth at bedtime.     hydrALAZINE (APRESOLINE) 25 MG tablet Take 1 tablet (25 mg total) by mouth every 8 (eight) hours. 60 tablet 0   insulin glargine (LANTUS) 100 UNIT/ML injection Inject 0.1 mLs (10 Units total) into the skin at bedtime. (Patient taking differently: Inject 18 Units into the skin at bedtime.)     LANTUS SOLOSTAR 100 UNIT/ML Solostar Pen SMARTSIG:18 Unit(s) SUB-Q Every Morning     magnesium hydroxide (MILK OF MAGNESIA) 400 MG/5ML suspension Take 15 mLs by mouth daily as needed for mild constipation.      magnesium oxide (MAG-OX) 400 MG tablet Take 400 mg by mouth 2 (two) times daily.      modafinil (PROVIGIL) 200 MG tablet Take 200 mg by mouth daily.     NYAMYC powder Apply 1 application topically daily.     omeprazole (PRILOSEC) 20 MG capsule Take 1 capsule (20 mg total) by mouth daily.     polyethylene glycol (MIRALAX / GLYCOLAX) packet Take 17 g by mouth 2 (two) times daily. (Patient taking differently: Take 17 g by mouth daily as needed for moderate constipation.) 14 each 0   potassium chloride SA (K-DUR,KLOR-CON) 20 MEQ tablet Take 20 mEq by mouth daily.     torsemide (DEMADEX) 20 MG tablet Take 1 tablet (20 mg total) by mouth 2 (two) times daily.     Vitamin E 180 MG CAPS Take 180 mg by mouth daily.     No current facility-administered medications on file prior to visit.    There are no Patient Instructions on file for this visit. No follow-ups on file.   Kris Hartmann, NP

## 2021-07-24 ENCOUNTER — Ambulatory Visit: Payer: Medicare HMO | Admitting: Physical Therapy

## 2021-07-26 ENCOUNTER — Ambulatory Visit: Payer: Medicare HMO

## 2021-08-01 ENCOUNTER — Ambulatory Visit: Payer: Medicare HMO | Admitting: Physical Therapy

## 2021-08-07 ENCOUNTER — Other Ambulatory Visit (INDEPENDENT_AMBULATORY_CARE_PROVIDER_SITE_OTHER): Payer: Self-pay | Admitting: Nurse Practitioner

## 2021-08-07 DIAGNOSIS — I739 Peripheral vascular disease, unspecified: Secondary | ICD-10-CM

## 2021-08-07 DIAGNOSIS — Z9889 Other specified postprocedural states: Secondary | ICD-10-CM

## 2021-08-08 ENCOUNTER — Encounter (INDEPENDENT_AMBULATORY_CARE_PROVIDER_SITE_OTHER): Payer: Self-pay | Admitting: Nurse Practitioner

## 2021-08-08 ENCOUNTER — Ambulatory Visit (INDEPENDENT_AMBULATORY_CARE_PROVIDER_SITE_OTHER): Payer: Medicare HMO

## 2021-08-08 ENCOUNTER — Ambulatory Visit (INDEPENDENT_AMBULATORY_CARE_PROVIDER_SITE_OTHER): Payer: Medicare HMO | Admitting: Nurse Practitioner

## 2021-08-08 VITALS — BP 136/75 | HR 73 | Resp 17 | Ht 65.0 in | Wt 146.0 lb

## 2021-08-08 DIAGNOSIS — Z9889 Other specified postprocedural states: Secondary | ICD-10-CM

## 2021-08-08 DIAGNOSIS — I739 Peripheral vascular disease, unspecified: Secondary | ICD-10-CM

## 2021-08-08 DIAGNOSIS — I10 Essential (primary) hypertension: Secondary | ICD-10-CM | POA: Diagnosis not present

## 2021-08-08 DIAGNOSIS — E782 Mixed hyperlipidemia: Secondary | ICD-10-CM

## 2021-08-18 ENCOUNTER — Encounter (INDEPENDENT_AMBULATORY_CARE_PROVIDER_SITE_OTHER): Payer: Self-pay | Admitting: Nurse Practitioner

## 2021-08-18 NOTE — Progress Notes (Signed)
Subjective:    Patient ID: [name], female    DOB: 03/02/1941, 81 y.o.   MRN: [id number] Chief Complaint  Patient presents with   Follow-up    ultrasound    [name] is a 81 year old female that returns today for observation of her known peripheral arterial disease.  The patient herself is a poor historian due to previous strokes and much of her history is done by her husband.  He denies any symptoms consistent with rest pain.  She currently works with physical therapy and has been doing fairly well.  Most recent intervention was in September on her right lower extremity.  The patient also largely does not walk significantly she is mostly wheelchair-bound.  Previously, it is noted that she has a small wound on her right fourth toe.  It is very superficial and at the toenail.  There has been no worsening and it appears to be somewhat smaller.    Previously, her ABIs are noncompressible with an ABI 1.36 on the right and 1.45 on the left.  The right has monophasic tibial artery waveforms of the left having biphasic/monophasic waveforms.  Today the patient has biphasic waveforms throughout the right lower extremity with biphasic waveforms in the left lower extremity that transition to monophasic in the tibial arteries.   Review of Systems  Skin:  Positive for wound.  Neurological:  Positive for weakness.  All other systems reviewed and are negative.     Objective:   Physical Exam Vitals reviewed.  HENT:     Head: Normocephalic.  Cardiovascular:     Rate and Rhythm: Normal rate.  Pulmonary:     Effort: Pulmonary effort is normal.  Skin:    General: Skin is warm and dry.  Neurological:     Mental Status: She is alert. Mental status is at baseline.     Motor: Weakness present.     Gait: Gait abnormal.  Psychiatric:        Mood and Affect: Mood normal.        Behavior: Behavior normal.        Thought Content: Thought content normal.        Judgment: Judgment normal.     BP 136/75 (BP Location: Right Arm)    Pulse 73    Resp 17    Ht 5\' 5"  (1.651 m)    Wt 146 lb (66.2 kg)    BMI 24.30 kg/m   Past Medical History:  Diagnosis Date   Acute heart failure (HCC)    Aortic stenosis    CHF (congestive heart failure) (HCC)    Complication of anesthesia    Hard to wake patient up after having anesthesia   Diabetes mellitus without complication (HCC)    Diabetic neuropathy (HCC)    Takes Lyrica   Edema of both legs    Takes Lasix   GERD (gastroesophageal reflux disease)    High cholesterol    HTN (hypertension)    Hypertension    PAD (peripheral artery disease) (HCC)    Shortness of breath dyspnea    Spasm of back muscles    Stroke (HCC)    Wound, open    Right great toe    Social History   Socioeconomic History   Marital status: Married    Spouse name: Not on file   Number of children: Not on file   Years of education: Not on file   Highest education level: Not on file  Occupational History  Not on file  Tobacco Use   Smoking status: Never   Smokeless tobacco: Never   Tobacco comments:    Never smoked  Substance and Sexual Activity   Alcohol use: No    Alcohol/week: 0.0 standard drinks   Drug use: No   Sexual activity: Never  Other Topics Concern   Not on file  Social History Narrative   Not on file   Social Determinants of Health   Financial Resource Strain: Not on file  Food Insecurity: Not on file  Transportation Needs: Not on file  Physical Activity: Not on file  Stress: Not on file  Social Connections: Not on file  Intimate Partner Violence: Not on file    Past Surgical History:  Procedure Laterality Date   AMPUTATION TOE Left 06/09/2019   Procedure: Left third toe and partial great toe amputation and debridement;  Surgeon: Katha Cabal, MD;  Location: [facility];  Service: Vascular;  Laterality: Left;   AMPUTATION TOE Left 04/06/2020   Procedure: AMPUTATION LEFT SECOND TOE;  Surgeon: Samara Deist, DPM;   Location: [facility];  Service: Podiatry;  Laterality: Left;   BYPASS GRAFT POPLITEAL TO TIBIAL Right 02/28/2016   Procedure: BYPASS GRAFT RIGHT BELOW KNEE POPLITEAL TO PERONEAL USING REVERSED RIGHT GREATER SAPHENOUS VEIN;  Surgeon: Conrad Ojai, MD;  Location: [place];  Service: Vascular;  Laterality: Right;   CYST EXCISION     Abdomen   EYE SURGERY Bilateral    Cataract removal   INTRAMEDULLARY (IM) NAIL INTERTROCHANTERIC Left 02/04/2014   Procedure: INTRAMEDULLARY (IM) NAIL INTERTROCHANTRIC FEMORAL;  Surgeon: Mauri Pole, MD;  Location: [place];  Service: Orthopedics;  Laterality: Left;   IR GENERIC HISTORICAL  03/01/2016   IR ANGIO INTRA EXTRACRAN SEL COM CAROTID INNOMINATE UNI R MOD SED 03/01/2016 [name], MD [facility]   IR GENERIC HISTORICAL  03/01/2016   IR ENDOVASC INTRACRANIAL INF OTHER THAN THROMBO ART INC DIAG ANGIO 03/01/2016 [name], MD [facility]   IR GENERIC HISTORICAL  03/01/2016   IR INTRAVSC STENT CERV CAROTID W/O EMB-PROT MOD SED INC ANGIO 03/01/2016 [name], MD [facility]   IR GENERIC HISTORICAL  06/03/2016   IR RADIOLOGIST EVAL & MGMT 06/03/2016 [facility]   LOWER EXTREMITY ANGIOGRAPHY Left 02/09/2019   Procedure: LOWER EXTREMITY ANGIOGRAPHY;  Surgeon: Katha Cabal, MD;  Location: [place] [facility];  Service: Cardiovascular;  Laterality: Left;   LOWER EXTREMITY ANGIOGRAPHY Left 03/10/2019   Procedure: LOWER EXTREMITY ANGIOGRAPHY;  Surgeon: Katha Cabal, MD;  Location: [place] [facility];  Service: Cardiovascular;  Laterality: Left;   LOWER EXTREMITY ANGIOGRAPHY Left 04/27/2019   Procedure: LOWER EXTREMITY ANGIOGRAPHY;  Surgeon: Katha Cabal, MD;  Location: [place] [facility];  Service: Cardiovascular;  Laterality: Left;   LOWER EXTREMITY ANGIOGRAPHY Left 06/08/2019   Procedure: Lower Extremity Angiography;  Surgeon: Katha Cabal, MD;  Location: [place] [facility];  Service: Cardiovascular;  Laterality: Left;    LOWER EXTREMITY ANGIOGRAPHY Left 04/03/2020   Procedure: Lower Extremity Angiography;  Surgeon: Algernon Huxley, MD;  Location: [place] [facility];  Service: Cardiovascular;  Laterality: Left;   LOWER EXTREMITY ANGIOGRAPHY Right 10/20/2020   Procedure: Lower Extremity Angiography;  Surgeon: Katha Cabal, MD;  Location: [place] [facility];  Service: Cardiovascular;  Laterality: Right;   LOWER EXTREMITY ANGIOGRAPHY Left 01/23/2021   Procedure: LOWER EXTREMITY ANGIOGRAPHY;  Surgeon: Katha Cabal, MD;  Location: [place] [facility];  Service: Cardiovascular;  Laterality:  Left;   LOWER EXTREMITY ANGIOGRAPHY Right 04/10/2021   Procedure: LOWER EXTREMITY ANGIOGRAPHY;  Surgeon: Katha Cabal, MD;  Location: [place] [facility];  Service: Cardiovascular;  Laterality: Right;   ORIF TOE FRACTURE Right 02/08/2014   Procedure: OPEN REDUCTION INTERNAL FIXATION Right METATARSAL  FRACTURE ;  Surgeon: Wylene Simmer, MD;  Location: [place];  Service: Orthopedics;  Laterality: Right;   PERIPHERAL VASCULAR CATHETERIZATION N/A 12/28/2015   Procedure: Abdominal Aortogram w/Lower Extremity;  Surgeon: Conrad Mays Chapel, MD;  Location: [place] [facility];  Service: Cardiovascular;  Laterality: N/A;   RADIOLOGY WITH ANESTHESIA N/A 03/01/2016   Procedure: RADIOLOGY WITH ANESTHESIA;  Surgeon: [name], MD;  Location: [place];  Service: Radiology;  Laterality: N/A;   VEIN HARVEST Right 02/28/2016   Procedure: RIGHT GREATER SAPHENOUS VEIN HARVEST;  Surgeon: Conrad [place], MD;  Location: [place];  Service: Vascular;  Laterality: Right;    Family History  Problem Relation Age of Onset   Diabetes Other    Liver disease Mother    CVA Father    Diabetes Father    Hypertension Father     Allergies  Allergen Reactions   Iodine Anaphylaxis   Penicillins Anaphylaxis    Tolerates ceftriaxone, cefazolin Did it involve swelling of the face/tongue/throat, SOB, or low BP? Unknown Did it involve sudden or severe  rash/hives, skin peeling, or any reaction on the inside of your mouth or nose? Unknown Did you need to seek medical attention at a hospital or doctor's office? Unknown When did it last happen?      Unknown If all above answers are NO, may proceed with cephalosporin use.    Shellfish Allergy Anaphylaxis   Sulfa Antibiotics Anaphylaxis   Sulfacetamide Sodium Anaphylaxis   Sulfasalazine Anaphylaxis   Eggs Or Egg-Derived Products     SHORTNESS OF BREATH   Morphine And Related Other (See Comments)    Altered mental status    CBC Latest Ref Rng & Units 06/05/2021 03/10/2021 03/09/2021  WBC 4.0 - 10.5 K/uL 9.9 4.3 4.1  Hemoglobin 12.0 - 15.0 g/dL 10.7(L) 9.8(L) 10.5(L)  Hematocrit 36.0 - 46.0 % 33.8(L) 31.4(L) 34.1(L)  Platelets 150 - 400 K/uL 261 148(L) 135(L)      CMP     Component Value Date/Time   NA 139 06/05/2021 1253   K 4.5 06/05/2021 1253   CL 103 06/05/2021 1253   CO2 29 06/05/2021 1253   GLUCOSE 212 (H) 06/05/2021 1253   BUN 16 06/05/2021 1253   CREATININE 0.49 06/05/2021 1253   CALCIUM 9.4 06/05/2021 1253   PROT 6.4 (L) 06/05/2021 1253   ALBUMIN 3.1 (L) 06/05/2021 1253   AST 21 06/05/2021 1253   ALT 22 06/05/2021 1253   ALKPHOS 66 06/05/2021 1253   BILITOT 0.7 06/05/2021 1253   GFRNONAA >60 06/05/2021 1253   GFRAA >60 04/06/2020 0535     VAS [place] ABI WITH/WO TBI  Result Date: 07/19/2021  LOWER EXTREMITY DOPPLER STUDY Patient Name:  SHYNICE SIGEL  Date of Exam:   07/16/2021 Medical Rec #: [id number]      Accession #:    [id number] Date of Birth: 04/05/1941       Patient Gender: F Patient Age:   58 years Exam Location:  [facility] Procedure:      VAS [place] ABI WITH/WO TBI Referring Phys: Hortencia Pilar --------------------------------------------------------------------------------  Indications: Peripheral artery disease. Other Factors: 03/10/2019 Left PTA popliteal and ATA.  Vascular Interventions: 04/27/2019: Ultrasound Guided access to  the Distal Right                          Anterior Tibial Artery. PTA of the Right SFA and                         Popliteal Artery. Crosser Atherectomy with PTA of the                         Right ATA.                         06/08/2019: PTA of the Left SFA and Popliteal Artery.                         PTA of the Left Anterior Tibial Artery.                         06/09/2019: Amputation of the Left First and Third Toe;                         04/10/21: Right SFA/popliteal & ATA                         thrombectomy/PTA/stent;. Comparison Study: 04/16/2021 Performing Technologist: Almira Coaster RVS  Examination Guidelines: A complete evaluation includes at minimum, Doppler waveform signals and systolic blood pressure reading at the level of bilateral brachial, anterior tibial, and posterior tibial arteries, when vessel segments are accessible. Bilateral testing is considered an integral part of a complete examination. Photoelectric Plethysmograph (PPG) waveforms and toe systolic pressure readings are included as required and additional duplex testing as needed. Limited examinations for reoccurring indications may be performed as noted.  ABI Findings: +---------+------------------+-----+----------+------------+  Right     Rt Pressure (mmHg) Index Waveform   Comment       +---------+------------------+-----+----------+------------+  Brachial  138                                               +---------+------------------+-----+----------+------------+  ATA       181                      monophasic 1.31          +---------+------------------+-----+----------+------------+  PTA       187                1.36  monophasic               +---------+------------------+-----+----------+------------+  Great Toe                                     Not Obtained  +---------+------------------+-----+----------+------------+ +---------+------------------+-----+----------+------------+  Left      Lt Pressure (mmHg) Index Waveform   Comment        +---------+------------------+-----+----------+------------+  ATA       200                      biphasic   1.45          +---------+------------------+-----+----------+------------+  PTA       153                1.11  monophasic               +---------+------------------+-----+----------+------------+  Great Toe                                     Not obtained  +---------+------------------+-----+----------+------------+ +-------+-----------+------------+------------+------------+  ABI/TBI Today's ABI Today's TBI  Previous ABI Previous TBI  +-------+-----------+------------+------------+------------+  Right   1.36        Not Obtained 1.70         N/A           +-------+-----------+------------+------------+------------+  Left    1.45        Not Obtained 1.54         N/A           +-------+-----------+------------+------------+------------+ Bilateral ABIs appear essentially unchanged compared to prior study on 04/16/2021.  Summary: Right: Resting right ankle-brachial index is within normal range. No evidence of significant right lower extremity arterial disease. Left: Resting left ankle-brachial index is within normal range. No evidence of significant left lower extremity arterial disease.  *See table(s) above for measurements and observations.  Electronically signed by Hortencia Pilar MD on 07/19/2021 at 5:34:40 PM.    Final        Assessment & Plan:   1. PAD (peripheral artery disease) ([place]) The patient's wound is not showing any signs or symptoms of worsening.  Stent is open with mainly biphasic flow throughout the right lower extremity.  We will continue with following monitoring.  While patient return in 3 months unless there is deterioration of the wound.  2. Primary hypertension Continue antihypertensive medications as already ordered, these medications have been reviewed and there are no changes at this time.   3. Mixed hyperlipidemia Continue statin as ordered and reviewed, no changes at this  time    Current Outpatient Medications on File Prior to Visit  Medication Sig Dispense Refill   acetaminophen (TYLENOL) 500 MG tablet Take 1,000 mg by mouth every 6 (six) hours as needed for mild pain.     amLODipine (NORVASC) 10 MG tablet Take 10 mg by mouth daily.     aspirin EC 81 MG EC tablet Take 1 tablet (81 mg total) by mouth daily.     atorvastatin (LIPITOR) 20 MG tablet Take 1 tablet (20 mg total) by mouth daily. (Patient taking differently: Take 20 mg by mouth at bedtime.) 30 tablet 11   budesonide (PULMICORT) 0.25 MG/2ML nebulizer solution Take 2 mLs (0.25 mg total) by nebulization 2 (two) times daily. 60 mL 12   carbamide peroxide (DEBROX) 6.5 % OTIC solution Place 5 drops into both ears 2 (two) times daily as needed (ear wax).     Carboxymethylcellul-Glycerin (LUBRICATING EYE DROPS OP) Place 1 drop into both eyes daily as needed (dry eyes).     carvedilol (COREG) 6.25 MG tablet Take 6.25 mg by mouth 2 (two) times daily with a meal.     cetirizine (ZYRTEC) 10 MG tablet Take 10 mg by mouth daily.     cholecalciferol (VITAMIN D3) 25 MCG (1000 UNIT) tablet Take 1,000 Units by mouth daily.     clopidogrel (PLAVIX) 75 MG tablet Take 1 tablet (75 mg total) by mouth daily with breakfast. 30 tablet 0   diclofenac Sodium (VOLTAREN) 1 % GEL Apply  1 application topically 4 (four) times daily as needed (pain).     docusate sodium (COLACE) 100 MG capsule Take 100 mg by mouth daily.     fluticasone (FLONASE) 50 MCG/ACT nasal spray Place 1 spray into both nostrils daily as needed for allergies.      gabapentin (NEURONTIN) 300 MG capsule Take 300 mg by mouth at bedtime.     hydrALAZINE (APRESOLINE) 25 MG tablet Take 1 tablet (25 mg total) by mouth every 8 (eight) hours. 60 tablet 0   insulin glargine (LANTUS) 100 UNIT/ML injection Inject 0.1 mLs (10 Units total) into the skin at bedtime. (Patient taking differently: Inject 18 Units into the skin at bedtime.)     LANTUS SOLOSTAR 100 UNIT/ML Solostar  Pen SMARTSIG:18 Unit(s) SUB-Q Every Morning     magnesium hydroxide (MILK OF MAGNESIA) 400 MG/5ML suspension Take 15 mLs by mouth daily as needed for mild constipation.     magnesium oxide (MAG-OX) 400 MG tablet Take 400 mg by mouth 2 (two) times daily.      modafinil (PROVIGIL) 200 MG tablet Take 200 mg by mouth daily.     NYAMYC powder Apply 1 application topically daily.     omeprazole (PRILOSEC) 20 MG capsule Take 1 capsule (20 mg total) by mouth daily.     polyethylene glycol (MIRALAX / GLYCOLAX) packet Take 17 g by mouth 2 (two) times daily. (Patient taking differently: Take 17 g by mouth daily as needed for moderate constipation.) 14 each 0   potassium chloride SA (K-DUR,KLOR-CON) 20 MEQ tablet Take 20 mEq by mouth daily.     torsemide (DEMADEX) 20 MG tablet Take 1 tablet (20 mg total) by mouth 2 (two) times daily.     Vitamin E 180 MG CAPS Take 180 mg by mouth daily.     No current facility-administered medications on file prior to visit.    There are no Patient Instructions on file for this visit. No follow-ups on file.   Kris Hartmann, NP

## 2021-08-20 ENCOUNTER — Ambulatory Visit: Payer: Medicare HMO | Admitting: Physical Therapy

## 2021-08-22 ENCOUNTER — Other Ambulatory Visit: Payer: Self-pay

## 2021-08-22 ENCOUNTER — Ambulatory Visit: Payer: Medicare HMO | Attending: Family Medicine | Admitting: Physical Therapy

## 2021-08-22 ENCOUNTER — Encounter: Payer: Self-pay | Admitting: Physical Therapy

## 2021-08-22 DIAGNOSIS — R278 Other lack of coordination: Secondary | ICD-10-CM | POA: Insufficient documentation

## 2021-08-22 DIAGNOSIS — M6281 Muscle weakness (generalized): Secondary | ICD-10-CM | POA: Insufficient documentation

## 2021-08-22 DIAGNOSIS — R2689 Other abnormalities of gait and mobility: Secondary | ICD-10-CM | POA: Diagnosis present

## 2021-08-22 DIAGNOSIS — R2681 Unsteadiness on feet: Secondary | ICD-10-CM | POA: Insufficient documentation

## 2021-08-22 DIAGNOSIS — R269 Unspecified abnormalities of gait and mobility: Secondary | ICD-10-CM | POA: Insufficient documentation

## 2021-08-22 DIAGNOSIS — R262 Difficulty in walking, not elsewhere classified: Secondary | ICD-10-CM | POA: Insufficient documentation

## 2021-08-22 DIAGNOSIS — R293 Abnormal posture: Secondary | ICD-10-CM | POA: Diagnosis present

## 2021-08-22 NOTE — Therapy (Signed)
[place] MAIN [facility] SERVICES [address] [place], [place], [address] Phone: [phone]   Fax:  [phone]  Physical Therapy Treatment Physical Therapy Progress Note   Dates of reporting period  06/12/21   to   08/22/21   Patient Details  Name: [name] MRN: [id number] Date of Birth: 1941/03/02 Referring Provider (PT): Sharin Grave   Encounter Date: 08/22/2021   PT End of Session - 08/22/21 1307     Visit Number 19    Number of Visits 24    Date for PT Re-Evaluation 11/14/21    Authorization Type [employer]; Medicaid    Authorization Time Period 1/18-3/31, 24 visits    Authorization - Visit Number 1    Authorization - Number of Visits 24    PT Start Time 1302    PT Stop Time 1345    PT Time Calculation (min) 43 min    Equipment Utilized During Treatment Gait belt    Activity Tolerance Patient tolerated treatment well;Patient limited by fatigue;Patient limited by pain    Behavior During Therapy Flat affect             Past Medical History:  Diagnosis Date   Acute heart failure ([place])    Aortic stenosis    CHF (congestive heart failure) ([place])    Complication of anesthesia    Hard to wake patient up after having anesthesia   Diabetes mellitus without complication ([place])    Diabetic neuropathy ([place])    Takes Lyrica   Edema of both legs    Takes Lasix   GERD (gastroesophageal reflux disease)    High cholesterol    HTN (hypertension)    Hypertension    PAD (peripheral artery disease) ([place])    Shortness of breath dyspnea    Spasm of back muscles    Stroke ([place])    Wound, open    Right great toe    Past Surgical History:  Procedure Laterality Date   AMPUTATION TOE Left 06/09/2019   Procedure: Left third toe and partial great toe amputation and debridement;  Surgeon: Katha Cabal, MD;  Location: [facility];  Service: Vascular;  Laterality: Left;   AMPUTATION TOE Left 04/06/2020   Procedure: AMPUTATION LEFT  SECOND TOE;  Surgeon: Samara Deist, DPM;  Location: [facility];  Service: Podiatry;  Laterality: Left;   BYPASS GRAFT POPLITEAL TO TIBIAL Right 02/28/2016   Procedure: BYPASS GRAFT RIGHT BELOW KNEE POPLITEAL TO PERONEAL USING REVERSED RIGHT GREATER SAPHENOUS VEIN;  Surgeon: Conrad Otero, MD;  Location: [place];  Service: Vascular;  Laterality: Right;   CYST EXCISION     Abdomen   EYE SURGERY Bilateral    Cataract removal   INTRAMEDULLARY (IM) NAIL INTERTROCHANTERIC Left 02/04/2014   Procedure: INTRAMEDULLARY (IM) NAIL INTERTROCHANTRIC FEMORAL;  Surgeon: Mauri Pole, MD;  Location: [place];  Service: Orthopedics;  Laterality: Left;   IR GENERIC HISTORICAL  03/01/2016   IR ANGIO INTRA EXTRACRAN SEL COM CAROTID INNOMINATE UNI R MOD SED 03/01/2016 [name], MD [facility]   IR GENERIC HISTORICAL  03/01/2016   IR ENDOVASC INTRACRANIAL INF OTHER THAN THROMBO ART INC DIAG ANGIO 03/01/2016 [name], MD [facility]   IR GENERIC HISTORICAL  03/01/2016   IR INTRAVSC STENT CERV CAROTID W/O EMB-PROT MOD SED INC ANGIO 03/01/2016 [name], MD [facility]   IR GENERIC HISTORICAL  06/03/2016   IR RADIOLOGIST EVAL & MGMT 06/03/2016 [facility]   LOWER EXTREMITY ANGIOGRAPHY  Left 02/09/2019   Procedure: LOWER EXTREMITY ANGIOGRAPHY;  Surgeon: Katha Cabal, MD;  Location: [place] [facility];  Service: Cardiovascular;  Laterality: Left;   LOWER EXTREMITY ANGIOGRAPHY Left 03/10/2019   Procedure: LOWER EXTREMITY ANGIOGRAPHY;  Surgeon: Katha Cabal, MD;  Location: [place] [facility];  Service: Cardiovascular;  Laterality: Left;   LOWER EXTREMITY ANGIOGRAPHY Left 04/27/2019   Procedure: LOWER EXTREMITY ANGIOGRAPHY;  Surgeon: Katha Cabal, MD;  Location: [place] [facility];  Service: Cardiovascular;  Laterality: Left;   LOWER EXTREMITY ANGIOGRAPHY Left 06/08/2019   Procedure: Lower Extremity Angiography;  Surgeon: Katha Cabal, MD;  Location: [place] [facility];   Service: Cardiovascular;  Laterality: Left;   LOWER EXTREMITY ANGIOGRAPHY Left 04/03/2020   Procedure: Lower Extremity Angiography;  Surgeon: Algernon Huxley, MD;  Location: [place] [facility];  Service: Cardiovascular;  Laterality: Left;   LOWER EXTREMITY ANGIOGRAPHY Right 10/20/2020   Procedure: Lower Extremity Angiography;  Surgeon: Katha Cabal, MD;  Location: [place] [facility];  Service: Cardiovascular;  Laterality: Right;   LOWER EXTREMITY ANGIOGRAPHY Left 01/23/2021   Procedure: LOWER EXTREMITY ANGIOGRAPHY;  Surgeon: Katha Cabal, MD;  Location: [place] [facility];  Service: Cardiovascular;  Laterality: Left;   LOWER EXTREMITY ANGIOGRAPHY Right 04/10/2021   Procedure: LOWER EXTREMITY ANGIOGRAPHY;  Surgeon: Katha Cabal, MD;  Location: [place] [facility];  Service: Cardiovascular;  Laterality: Right;   ORIF TOE FRACTURE Right 02/08/2014   Procedure: OPEN REDUCTION INTERNAL FIXATION Right METATARSAL  FRACTURE ;  Surgeon: Wylene Simmer, MD;  Location: [place];  Service: Orthopedics;  Laterality: Right;   PERIPHERAL VASCULAR CATHETERIZATION N/A 12/28/2015   Procedure: Abdominal Aortogram w/Lower Extremity;  Surgeon: Conrad Perrinton, MD;  Location: [place] [facility];  Service: Cardiovascular;  Laterality: N/A;   RADIOLOGY WITH ANESTHESIA N/A 03/01/2016   Procedure: RADIOLOGY WITH ANESTHESIA;  Surgeon: [name], MD;  Location: [place];  Service: Radiology;  Laterality: N/A;   VEIN HARVEST Right 02/28/2016   Procedure: RIGHT GREATER SAPHENOUS VEIN HARVEST;  Surgeon: Conrad Rapids, MD;  Location: [place];  Service: Vascular;  Laterality: Right;    There were no vitals filed for this visit.   Subjective Assessment - 08/22/21 1304     Subjective Patient reports ongoing low back pain and some left hip pain. She did get MRI in December which showed L3 compression fracture. her Husband believes her back is doing better as she has been tolerating mobility better at home with less  complaints. She reports her back is hurting a little; She reports, "my booty is hurting."  Husband reports that they get her up out of bed a few times a week and sit her up in the wheelchair;    Patient is accompained by: Family member    Pertinent History 79yoF presenting to [facility] to address recent gradual progressive decline in mobility, family goals for improved strength, to promote > participation in transfers to wheelchair and safety with ADL performance. Recent illness includes admission to [facility] on 03/07/21-03/10/21 due to COVID infection c AMS and fever. Pt then underwent outpatient recanalization/limb salvage of RLE 04/10/2021; LLE salvage performed in June 2022. Other PMH: CHF, DM, HTN, CVA, COPD, multiple remote toe amputations, stg 2 sacral ulcer. PLOF includes 3-4 years bed bound status, hoyerlifts to WC, totalA for ADL performance. Pt is cared for by family, as well as professional caregiver services.    Limitations Sitting;Walking;Lifting;Standing;House hold activities    How long  can you sit comfortably? Pt spouse reports 2 hours. Must be careful d/t preventing and trying to heal wounds on bottom.    How long can you stand comfortably? unable    How long can you walk comfortably? Unable    Diagnostic tests 6/21 and 9/06 peripheral vascular catheterizatoin; 04/10/2021 Successful recanalization right lower extremity for limb salvage    Patient Stated Goals to strengthen pt's trunk to improve mobility in chair, assist with ADLs    Currently in Pain? Yes    Pain Score --   "reports a little"   Pain Location Back    Pain Orientation Lower    Pain Descriptors / Indicators Aching;Sore    Pain Type Chronic pain    Pain Onset More than a month ago    Pain Frequency Intermittent    Aggravating Factors  worse with prolonged sitting    Pain Relieving Factors rest    Effect of Pain on Daily Activities decreased activity tolerance;    Multiple Pain Sites No                     TREATMENT:  Instructed patient in FOTO, 12% which is no change from last re-assessment. Patient is still not ambulatory and requires max A with most ADLs.    PT assessed patient's mobility at home. Her husband reports she is rolling a little better in bed.  Bed mobility not assessed this visit as lift was not available   Caregiver reports they have been getting her out of bed 2-3 x a week in the wheelchair using hoyer lift. He doesn't do much exercise with her as he is afraid she will fall out of chair.   Currently, patient exhibits a heavy posterior lean likely from chronic bed bound status and poor trunk control   Instructed patient in seated forward weight shift working towards sitting unsupported away from back of chair x5 reps Patient able to hold unsupported sitting with arms on arm rest for 30-60 sec with erect posture. She often falls back against back of chair due to fatigue;  Sitting in chair, forward away from backrest: -reaching with single UE in various directions x5 reps with min A for trunk control to avoid posterior loss of balance; -hip flexion march x10 reps each LE with AAROM for increased movement; requires min A for trunk support to avoid posterior loss of balance;  -Trunk rotation looking behind with min-mod A for trunk control x2 reps each;  Sitting supported in chair: -LAQ x10 reps each LE with cues to slow down LE movement for better motor control and stretch; -unable to complete ankle DF/PF   Instructed patient in sit<>Stand with standard walker Required mod A +1 for scooting forward in wheelchair Transferred sit<>Stand with walker, max A +2 with cues for hand placement, increase forward lean Upon standing, patient exhibits increased hip flexion with heavy posterior lean; She required cues for weight shift forward for increased weight bearing in BLE feet and to increase gluteal activation for increased hip extension; Patient reports increased back/buttock pain  with standing. She was able to hold standing position for approximately 10-15 sec Completed 1 repetitions   Patient required max A +2 for scooting back in wheelchair and repositioning;   Patient tolerated session fair. She reports no pain once seated in chair Patient's condition has the potential to improve in response to therapy. Maximum improvement is yet to be obtained. The anticipated improvement is attainable and reasonable in a generally predictable  time.  Patient reports limited activity at home as caregiver does most of her ADLs. She has been getting up to wheelchair more, 2-3 x a week which is an improvement;     Reinforced HEP with instruction for caregiver to help patient work on seated march/LAQ while in wheelchair;                       PT Education - 08/22/21 1306     Education Details progress towards goals/positioning;    Person(s) Educated Patient;Spouse    Methods Explanation;Verbal cues    Comprehension Verbalized understanding;Returned demonstration;Verbal cues required;Need further instruction              PT Short Term Goals - 08/22/21 1308       PT SHORT TERM GOAL #1   Title Patient will be independent in home exercise program to improve strength/mobility for better functional independence with ADLs.    Baseline 10/6: to be initiated, 11/8: caregiver reports she is doing them 3x a day; 1/18: doesn't do much exercise    Time 4    Period Weeks    Status Partially Met    Target Date 09/19/21               PT Long Term Goals - 08/22/21 1309       PT LONG TERM GOAL #1   Title Patient will increase FOTO score to equal to or greater than 44 to demonstrate statistically significant improvement in mobility and quality of life.    Baseline 10/6: 26, 11/8: 12%, 1/18: 12%    Time 12    Period Weeks    Status Not Met    Target Date 11/14/21      PT LONG TERM GOAL #2   Title Patient will roll L and R in bed with mod A for decreased  caregiver burden for ADLs and toileting.    Baseline 10/6: per pt spouse, pt currently dependent, 11/8: mod A, inconsistent, 1/18: needs min A    Time 12    Period Weeks    Status Partially Met    Target Date 11/14/21      PT LONG TERM GOAL #3   Title Patient will sit upright in WC, with BUE support and without leaning on backrest for 10 minutes for ADL performance and pressure relief.    Baseline 10/6: pt exhibits core contraction but unable to sit upright, 11/8: able to sit upright for <20 sec with mod VCs for erect positioning, 1/18: able to hold for 60 sec with mod VCs    Time 12    Period Weeks    Status Partially Met    Target Date 11/14/21      PT LONG TERM GOAL #4   Title Pt will demonstrate ability to utilize BUEs to weight-shift to each side in WC and maintain weight shift for 30 seconds to assist in pressure relief and reduce risk of wound formation.    Baseline 10/6: pt currently unable, 11/8: unable to initiate weight shift, able to use UE but unable to shift hips without assistance; 1/18: requires max cues and min A for weight shift    Time 12    Period Weeks    Status Partially Met    Target Date 11/14/21                   Plan - 08/22/21 1500     Clinical Impression Statement Patient motivated and  participated well within session. She has missed several weeks of PT due to recent sickness and recent back pain as a result of L3 compression fracture. Patient does exhibit increased posterior lean and left lateral lean this session. That was improved with reaching and trunk movement activities. By end of session she was able to hold unsupported sitting for >1 min with cues. She continues to have knee/ankle contractures which limit mobility. Patient is max A +2 for transfers. She would benefit from additional skilled PT Intervention to improve strength, balance and mobility. Plan to utilize hoyer lift in future sessions to work on bed mobility to increase mobility at  home.    Personal Factors and Comorbidities Age;Time since onset of injury/illness/exacerbation;Comorbidity 3+;Past/Current Experience;Education;Social Background;Transportation;Sex;Fitness    Comorbidities diastolic congestive heart failure, diabetes mellitus, diabetic neuropathy, hypertension, hyperlipidemia, GERD, peripheral vascular disease, CVA. on 9/2 had amputation of L foot second toe MTP joint and LLE revascularization, recent hx of COVID19    Examination-Activity Limitations Bathing;Hygiene/Grooming;Squat;Bed Mobility;Stairs;Lift;Bend;Locomotion Level;Stand;Caring for [employer];Toileting;Carry;Self Feeding;Transfers;Continence;Dressing;Other;Sit    Examination-Participation Restrictions Cleaning;Shop;Community Activity;Meal Prep;Driving;Other    Stability/Clinical Decision Making Evolving/Moderate complexity    Rehab Potential Poor    PT Frequency 2x / week    PT Duration 12 weeks    PT Treatment/Interventions ADLs/Self Care Home Management;DME Instruction;Functional mobility training;Therapeutic activities;Therapeutic exercise;Neuromuscular re-education;Patient/family education;Wheelchair mobility training;Passive range of motion;Energy conservation;Cryotherapy;Biofeedback;Electrical Stimulation;Iontophoresis 60m/ml Dexamethasone;Moist Heat;Traction;Ultrasound;Stair training;Cognitive remediation;Balance training;Orthotic Fit/Training;Manual techniques;Manual lymph drainage;Splinting;Taping;Vasopneumatic Device;Vestibular;Visual/perceptual remediation/compensation;Gait training    PT Next Visit Plan bed mobility, pressure relief techniques;    PT Home Exercise Plan Previous- Seated lateral weight shift pressure relief technique in wheelchair to be performed 5 days/week for 2 sets of 10 repetitions each side.  PT provided patient with picture of technique as well.  05/21/2021- Access Code: [email or web address]   Consulted and Agree with Plan of Care Family member/caregiver    Family Member  Consulted Spouse             Patient will benefit from skilled therapeutic intervention in order to improve the following deficits and impairments:  Decreased cognition, Decreased skin integrity, Impaired sensation, Decreased mobility, Postural dysfunction, Decreased activity tolerance, Decreased endurance, Decreased range of motion, Decreased strength, Decreased balance, Decreased safety awareness, Increased edema, Impaired flexibility, Decreased coordination, Cardiopulmonary status limiting activity, Abnormal gait, Impaired UE functional use, Impaired tone, Improper body mechanics, Pain, Difficulty walking, Decreased knowledge of use of DME, Hypomobility, Impaired perceived functional ability  Visit Diagnosis: Abnormality of gait and mobility  Difficulty in walking, not elsewhere classified  Muscle weakness (generalized)  Unsteadiness on feet  Other lack of coordination  Other abnormalities of gait and mobility     Problem List Patient Active Problem List   Diagnosis Date Noted   Atherosclerosis of native arteries of extremity with rest pain ([place] 04/20/2021   COVID-19 virus infection 03/07/2021   Acute febrile illness 03/07/2021   Hypoalbuminemia 03/07/2021   Hyperglycemia due to diabetes mellitus ([facility] 03/07/2021   Elevated troponin 03/07/2021   Constipation 03/07/2021   COPD (chronic obstructive pulmonary disease) ([facility] 10/20/2020   GERD (gastroesophageal reflux disease) 10/20/2020   Physical deconditioning 10/20/2020   Cellulitis 10/19/2020   Necrotic toes (HCC)    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)    Diabetic neuropathy (HCC)    Gangrene of toe of both feet ([facility] 06/06/2019   Peripheral arterial disease ([facility] 04/27/2019   ESBL (extended spectrum beta-lactamase) producing bacteria infection 12/25/2018   PVD (peripheral vascular disease) ([facility] 09/12/2016   Ulcer of  right lower extremity ([place]) 08/29/2016   Acute on chronic diastolic heart failure  ([place]) 07/08/2016   HTN (hypertension) 07/05/2016   Sensorineural hearing loss (SNHL), 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   Chronic diastolic CHF (congestive heart failure) ([place]) 03/10/2016   HLD (hyperlipidemia) 03/09/2016   Acute respiratory acidosis ([place]) 03/05/2016   Anemia, iron deficiency 03/03/2016   Cerebrovascular accident (CVA) due to thrombosis of precerebral artery ([place]) 03/01/2016   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 ([place]) 03/01/2016   Atherosclerosis of native arteries of the extremities with gangrene ([place]) 12/08/2015   Anemia, chronic disease 04/27/2014   Unspecified hereditary and idiopathic peripheral neuropathy 03/14/2014   DM2 (diabetes mellitus, type 2) ([place]) 02/14/2014   Aortic stenosis 02/04/2014   Closed left subtrochanteric femur fracture ([place]) 02/03/2014   Hip fracture, left ([place]) 02/03/2014   Fibula fracture 02/03/2014   MTP instability 02/03/2014   Hip fracture ([place]) 02/03/2014    Yesmin Mutch, PT 08/22/2021, 3:06 PM  [place] MAIN [facility] SERVICES [place], [place], [address] Phone: [phone]   Fax:  [phone]  Name: CLEMA SKOUSEN MRN: [id number] Date of Birth: Aug 06, 1940

## 2021-08-27 ENCOUNTER — Other Ambulatory Visit: Payer: Self-pay

## 2021-08-27 ENCOUNTER — Encounter: Payer: Self-pay | Admitting: Physical Therapy

## 2021-08-27 ENCOUNTER — Ambulatory Visit: Payer: Medicare HMO | Admitting: Physical Therapy

## 2021-08-27 DIAGNOSIS — R2681 Unsteadiness on feet: Secondary | ICD-10-CM

## 2021-08-27 DIAGNOSIS — R262 Difficulty in walking, not elsewhere classified: Secondary | ICD-10-CM

## 2021-08-27 DIAGNOSIS — R293 Abnormal posture: Secondary | ICD-10-CM

## 2021-08-27 DIAGNOSIS — M6281 Muscle weakness (generalized): Secondary | ICD-10-CM

## 2021-08-27 DIAGNOSIS — R278 Other lack of coordination: Secondary | ICD-10-CM

## 2021-08-27 DIAGNOSIS — R269 Unspecified abnormalities of gait and mobility: Secondary | ICD-10-CM | POA: Diagnosis not present

## 2021-08-27 DIAGNOSIS — R2689 Other abnormalities of gait and mobility: Secondary | ICD-10-CM

## 2021-08-27 NOTE — Therapy (Signed)
[place] MAIN [facility] SERVICES [address] [place], [place], [address] Phone: [phone]   Fax:  [phone]  Physical Therapy Treatment Physical Therapy Progress Note   Dates of reporting period  06/12/21   to   08/27/21   Patient Details  Name: [name] MRN: [id number] Date of Birth: Nov 21, 1940 Referring Provider (PT): Sharin Grave   Encounter Date: 08/27/2021   PT End of Session - 08/27/21 1300     Visit Number 20    Number of Visits 64    Date for PT Re-Evaluation 11/14/21    Authorization Type [employer]; Medicaid    Authorization Time Period 1/18-3/31, 24 visits    Authorization - Visit Number 1    Authorization - Number of Visits 24    PT Start Time 1302    PT Stop Time 1345    PT Time Calculation (min) 43 min    Equipment Utilized During Treatment Gait belt    Activity Tolerance Patient tolerated treatment well;Patient limited by fatigue;Patient limited by pain    Behavior During Therapy Flat affect             Past Medical History:  Diagnosis Date   Acute heart failure ([place])    Aortic stenosis    CHF (congestive heart failure) ([place])    Complication of anesthesia    Hard to wake patient up after having anesthesia   Diabetes mellitus without complication ([place])    Diabetic neuropathy ([place])    Takes Lyrica   Edema of both legs    Takes Lasix   GERD (gastroesophageal reflux disease)    High cholesterol    HTN (hypertension)    Hypertension    PAD (peripheral artery disease) ([place])    Shortness of breath dyspnea    Spasm of back muscles    Stroke ([place])    Wound, open    Right great toe    Past Surgical History:  Procedure Laterality Date   AMPUTATION TOE Left 06/09/2019   Procedure: Left third toe and partial great toe amputation and debridement;  Surgeon: Katha Cabal, MD;  Location: [facility];  Service: Vascular;  Laterality: Left;   AMPUTATION TOE Left 04/06/2020   Procedure: AMPUTATION LEFT  SECOND TOE;  Surgeon: Samara Deist, DPM;  Location: [facility];  Service: Podiatry;  Laterality: Left;   BYPASS GRAFT POPLITEAL TO TIBIAL Right 02/28/2016   Procedure: BYPASS GRAFT RIGHT BELOW KNEE POPLITEAL TO PERONEAL USING REVERSED RIGHT GREATER SAPHENOUS VEIN;  Surgeon: Conrad [place], MD;  Location: [place];  Service: Vascular;  Laterality: Right;   CYST EXCISION     Abdomen   EYE SURGERY Bilateral    Cataract removal   INTRAMEDULLARY (IM) NAIL INTERTROCHANTERIC Left 02/04/2014   Procedure: INTRAMEDULLARY (IM) NAIL INTERTROCHANTRIC FEMORAL;  Surgeon: Mauri Pole, MD;  Location: [place];  Service: Orthopedics;  Laterality: Left;   IR GENERIC HISTORICAL  03/01/2016   IR ANGIO INTRA EXTRACRAN SEL COM CAROTID INNOMINATE UNI R MOD SED 03/01/2016 [name], MD [facility]   IR GENERIC HISTORICAL  03/01/2016   IR ENDOVASC INTRACRANIAL INF OTHER THAN THROMBO ART INC DIAG ANGIO 03/01/2016 [name], MD [facility]   IR GENERIC HISTORICAL  03/01/2016   IR INTRAVSC STENT CERV CAROTID W/O EMB-PROT MOD SED INC ANGIO 03/01/2016 [name], MD [facility]   IR GENERIC HISTORICAL  06/03/2016   IR RADIOLOGIST EVAL & MGMT 06/03/2016 [facility]   LOWER EXTREMITY ANGIOGRAPHY  Left 02/09/2019   Procedure: LOWER EXTREMITY ANGIOGRAPHY;  Surgeon: Katha Cabal, MD;  Location: [place] [facility];  Service: Cardiovascular;  Laterality: Left;   LOWER EXTREMITY ANGIOGRAPHY Left 03/10/2019   Procedure: LOWER EXTREMITY ANGIOGRAPHY;  Surgeon: Katha Cabal, MD;  Location: [place] [facility];  Service: Cardiovascular;  Laterality: Left;   LOWER EXTREMITY ANGIOGRAPHY Left 04/27/2019   Procedure: LOWER EXTREMITY ANGIOGRAPHY;  Surgeon: Katha Cabal, MD;  Location: [place] [facility];  Service: Cardiovascular;  Laterality: Left;   LOWER EXTREMITY ANGIOGRAPHY Left 06/08/2019   Procedure: Lower Extremity Angiography;  Surgeon: Katha Cabal, MD;  Location: [place] [facility];   Service: Cardiovascular;  Laterality: Left;   LOWER EXTREMITY ANGIOGRAPHY Left 04/03/2020   Procedure: Lower Extremity Angiography;  Surgeon: Algernon Huxley, MD;  Location: [place] [facility];  Service: Cardiovascular;  Laterality: Left;   LOWER EXTREMITY ANGIOGRAPHY Right 10/20/2020   Procedure: Lower Extremity Angiography;  Surgeon: Katha Cabal, MD;  Location: [place] [facility];  Service: Cardiovascular;  Laterality: Right;   LOWER EXTREMITY ANGIOGRAPHY Left 01/23/2021   Procedure: LOWER EXTREMITY ANGIOGRAPHY;  Surgeon: Katha Cabal, MD;  Location: [place] [facility];  Service: Cardiovascular;  Laterality: Left;   LOWER EXTREMITY ANGIOGRAPHY Right 04/10/2021   Procedure: LOWER EXTREMITY ANGIOGRAPHY;  Surgeon: Katha Cabal, MD;  Location: [place] [facility];  Service: Cardiovascular;  Laterality: Right;   ORIF TOE FRACTURE Right 02/08/2014   Procedure: OPEN REDUCTION INTERNAL FIXATION Right METATARSAL  FRACTURE ;  Surgeon: Wylene Simmer, MD;  Location: [place];  Service: Orthopedics;  Laterality: Right;   PERIPHERAL VASCULAR CATHETERIZATION N/A 12/28/2015   Procedure: Abdominal Aortogram w/Lower Extremity;  Surgeon: Conrad [place], MD;  Location: [place] [facility];  Service: Cardiovascular;  Laterality: N/A;   RADIOLOGY WITH ANESTHESIA N/A 03/01/2016   Procedure: RADIOLOGY WITH ANESTHESIA;  Surgeon: [name], MD;  Location: [place];  Service: Radiology;  Laterality: N/A;   VEIN HARVEST Right 02/28/2016   Procedure: RIGHT GREATER SAPHENOUS VEIN HARVEST;  Surgeon: Conrad [place], MD;  Location: [place];  Service: Vascular;  Laterality: Right;    There were no vitals filed for this visit.   Subjective Assessment - 08/27/21 1540     Subjective Patient reports doing fair. She states, "I think my bowels need to move. My booty hurts."    Patient is accompained by: Family member    Pertinent History 64yoF presenting to [place] to address recent gradual progressive decline in  mobility, family goals for improved strength, to promote > participation in transfers to wheelchair and safety with ADL performance. Recent illness includes admission to [facility] on 03/07/21-03/10/21 due to COVID infection c AMS and fever. Pt then underwent outpatient recanalization/limb salvage of RLE 04/10/2021; LLE salvage performed in June 2022. Other PMH: CHF, DM, HTN, CVA, COPD, multiple remote toe amputations, stg 2 sacral ulcer. PLOF includes 3-4 years bed bound status, hoyerlifts to WC, totalA for ADL performance. Pt is cared for by family, as well as professional caregiver services.    Limitations Sitting;Walking;Lifting;Standing;House hold activities    How long can you sit comfortably? Pt spouse reports 2 hours. Must be careful d/t preventing and trying to heal wounds on bottom.    How long can you stand comfortably? unable    How long can you walk comfortably? Unable    Diagnostic tests 6/21 and 9/06 peripheral vascular catheterizatoin; 04/10/2021 Successful recanalization right lower extremity for limb salvage  Patient Stated Goals to strengthen pt's trunk to improve mobility in chair, assist with ADLs    Currently in Pain? Yes    Pain Score --   unable to rate, reports moderate pain;   Pain Location Buttocks    Pain Orientation Lower    Pain Descriptors / Indicators Aching;Sore    Pain Type Chronic pain    Pain Onset More than a month ago    Pain Frequency Intermittent    Aggravating Factors  worse with prolonged sitting    Pain Relieving Factors rest    Effect of Pain on Daily Activities decreased activity tolerance;    Multiple Pain Sites No            TREATMENT:  TA: Patient transferred wheelchair to mat table with hoyer lift requiring +2 for proper positioning for comfort;  During session, patient educated and caregiver educated in proper rolling techniques including: Bending contralateral knee to facilitate start of rolling as well as reaching across body; Caregiver educated  in proper hand placement/positioning to increase ease of rolling including to start with holding onto knee and also pressing against shoulder to facilitate better trunk mobility/control;  Patient able to roll side/side with min-mod A reporting minimal discomfort; She continues to have decreased knee flexion ROM which makes positioning more difficult;   At end of session Patient Transferred back to wheelchair using hoyer lift requiring +2 for positioning;    Ex: Instructed patient/caregiver in exercise to facilitate better ROM/strengthening:  Hamstring curl, AAROM x10 reps each LE, patient reports increased knee pain with end range of motion having difficulty achieving knee flexion;   Lumbar trunk rotation passive x5 reps Required assistance for proper positioning being unable to tolerate full hooklying position due to decreased knee flexion ROM;   Bridges x10 reps, therapist assist for positioning and to increase gluteal squeeze; Patient reports moderate difficulty;  Patient tolerated session fair. She was able to tolerate increased exercise/ROM but continues to be limited with decreased knee flexion. Patient's caregiver reports better understanding of rolling technique for increased ease;   Patient's condition has the potential to improve in response to therapy. Maximum improvement is yet to be obtained. The anticipated improvement is attainable and reasonable in a generally predictable time.  Caregiver states patient has been getting out of bed more at home with hoyer lift. Goals were recently updated on 08/22/21, please refer to last recertification;                          PT Education - 08/27/21 1541     Education Details positioning/HEP    Person(s) Educated Patient;Spouse    Methods Explanation;Verbal cues    Comprehension Verbalized understanding;Returned demonstration;Verbal cues required;Need further instruction              PT Short Term Goals -  08/22/21 1308       PT SHORT TERM GOAL #1   Title Patient will be independent in home exercise program to improve strength/mobility for better functional independence with ADLs.    Baseline 10/6: to be initiated, 11/8: caregiver reports she is doing them 3x a day; 1/18: doesn't do much exercise    Time 4    Period Weeks    Status Partially Met    Target Date 09/19/21               PT Long Term Goals - 08/22/21 1309       PT LONG TERM  GOAL #1   Title Patient will increase FOTO score to equal to or greater than 44 to demonstrate statistically significant improvement in mobility and quality of life.    Baseline 10/6: 26, 11/8: 12%, 1/18: 12%    Time 12    Period Weeks    Status Not Met    Target Date 11/14/21      PT LONG TERM GOAL #2   Title Patient will roll L and R in bed with mod A for decreased caregiver burden for ADLs and toileting.    Baseline 10/6: per pt spouse, pt currently dependent, 11/8: mod A, inconsistent, 1/18: needs min A    Time 12    Period Weeks    Status Partially Met    Target Date 11/14/21      PT LONG TERM GOAL #3   Title Patient will sit upright in WC, with BUE support and without leaning on backrest for 10 minutes for ADL performance and pressure relief.    Baseline 10/6: pt exhibits core contraction but unable to sit upright, 11/8: able to sit upright for <20 sec with mod VCs for erect positioning, 1/18: able to hold for 60 sec with mod VCs    Time 12    Period Weeks    Status Partially Met    Target Date 11/14/21      PT LONG TERM GOAL #4   Title Pt will demonstrate ability to utilize BUEs to weight-shift to each side in WC and maintain weight shift for 30 seconds to assist in pressure relief and reduce risk of wound formation.    Baseline 10/6: pt currently unable, 11/8: unable to initiate weight shift, able to use UE but unable to shift hips without assistance; 1/18: requires max cues and min A for weight shift    Time 12    Period Weeks     Status Partially Met    Target Date 11/14/21                   Plan - 08/27/21 1541     Clinical Impression Statement Patient motivated and participated well within session. she was transferred to mat table. Educated patient/caregiver in proper positioning for rolling. Patient does require assistance to bend knee and then requires min-mod A for rolling. She was able to initiate rolling with reaching with cues. Patient/caregiver educated on LE exercise to do in supine for better flexibility. She continues to have increased stiffness with limited knee flexion which likely limits most bed mobility. she reports increased knee pain with knee flexion exercise. Patient would benefit from additional skilled PT Intervention to improve strength, ROM and positioning;    Personal Factors and Comorbidities Age;Time since onset of injury/illness/exacerbation;Comorbidity 3+;Past/Current Experience;Education;Social Background;Transportation;Sex;Fitness    Comorbidities diastolic congestive heart failure, diabetes mellitus, diabetic neuropathy, hypertension, hyperlipidemia, GERD, peripheral vascular disease, CVA. on 9/2 had amputation of L foot second toe MTP joint and LLE revascularization, recent hx of COVID19    Examination-Activity Limitations Bathing;Hygiene/Grooming;Squat;Bed Mobility;Stairs;Lift;Bend;Locomotion Level;Stand;Caring for [employer];Toileting;Carry;Self Feeding;Transfers;Continence;Dressing;Other;Sit    Examination-Participation Restrictions Cleaning;Shop;Community Activity;Meal Prep;Driving;Other    Stability/Clinical Decision Making Evolving/Moderate complexity    Rehab Potential Poor    PT Frequency 2x / week    PT Duration 12 weeks    PT Treatment/Interventions ADLs/Self Care Home Management;DME Instruction;Functional mobility training;Therapeutic activities;Therapeutic exercise;Neuromuscular re-education;Patient/family education;Wheelchair mobility training;Passive range  of motion;Energy conservation;Cryotherapy;Biofeedback;Electrical Stimulation;Iontophoresis 14m/ml Dexamethasone;Moist Heat;Traction;Ultrasound;Stair training;Cognitive remediation;Balance training;Orthotic Fit/Training;Manual techniques;Manual lymph drainage;Splinting;Taping;Vasopneumatic Device;Vestibular;Visual/perceptual remediation/compensation;Gait training    PT Next Visit  Plan bed mobility, pressure relief techniques;    PT Home Exercise Plan Previous- Seated lateral weight shift pressure relief technique in wheelchair to be performed 5 days/week for 2 sets of 10 repetitions each side.  PT provided patient with picture of technique as well.  05/21/2021- Access Code: [email or web address]    Consulted and Agree with Plan of Care Family member/caregiver    Family Member Consulted Spouse             Patient will benefit from skilled therapeutic intervention in order to improve the following deficits and impairments:  Decreased cognition, Decreased skin integrity, Impaired sensation, Decreased mobility, Postural dysfunction, Decreased activity tolerance, Decreased endurance, Decreased range of motion, Decreased strength, Decreased balance, Decreased safety awareness, Increased edema, Impaired flexibility, Decreased coordination, Cardiopulmonary status limiting activity, Abnormal gait, Impaired UE functional use, Impaired tone, Improper body mechanics, Pain, Difficulty walking, Decreased knowledge of use of DME, Hypomobility, Impaired perceived functional ability  Visit Diagnosis: Other abnormalities of gait and mobility  Abnormality of gait and mobility  Difficulty in walking, not elsewhere classified  Muscle weakness (generalized)  Unsteadiness on feet  Other lack of coordination  Abnormal posture     Problem List Patient Active Problem List   Diagnosis Date Noted   Atherosclerosis of native arteries of extremity with rest pain ([place]) 04/20/2021   COVID-19 virus infection 03/07/2021    Acute febrile illness 03/07/2021   Hypoalbuminemia 03/07/2021   Hyperglycemia due to diabetes mellitus ([place]) 03/07/2021   Elevated troponin 03/07/2021   Constipation 03/07/2021   COPD (chronic obstructive pulmonary disease) ([place]) 10/20/2020   GERD (gastroesophageal reflux disease) 10/20/2020   Physical deconditioning 10/20/2020   Cellulitis 10/19/2020   Necrotic toes (H[facility])    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)    Diabetic neuropathy (H[facility])    Gangrene of toe of both feet ([place]) 06/06/2019   Peripheral arterial disease ([place]) 04/27/2019   ESBL (extended spectrum beta-lactamase) producing bacteria infection 12/25/2018   PVD (peripheral vascular disease) ([place]) 09/12/2016   Ulcer of right lower extremity ([place]) 08/29/2016   Acute on chronic diastolic heart failure ([place]) 07/08/2016   HTN (hypertension) 07/05/2016   Sensorineural hearing loss (SNHL), 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   Chronic diastolic CHF (congestive heart failure) ([place]) 03/10/2016   HLD (hyperlipidemia) 03/09/2016   Acute respiratory acidosis (HCC) 03/05/2016   Anemia, iron deficiency 03/03/2016   Cerebrovascular accident (CVA) due to thrombosis of precerebral artery ([place]) 03/01/2016   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 ([place]) 03/01/2016   Atherosclerosis of native arteries of the extremities with gangrene ([place]) 12/08/2015   Anemia, chronic disease 04/27/2014   Unspecified hereditary and idiopathic peripheral neuropathy 03/14/2014   DM2 (diabetes mellitus, type 2) ([place]) 02/14/2014   Aortic stenosis 02/04/2014   Closed left subtrochanteric femur fracture ([place]) 02/03/2014   Hip fracture, left ([place]) 02/03/2014   Fibula fracture 02/03/2014   MTP instability 02/03/2014   Hip fracture ([place]) 02/03/2014    Kennadee Walthour, PT, DPT 08/28/2021, 2:46 PM  [place] [facility] MAIN [facility] SERVICES [address] [place], [place], [address] Phone: [phone]   Fax:   [phone]  Name: [name] MRN: [id number] Date of Birth: Dec 10, 1940

## 2021-08-29 ENCOUNTER — Encounter: Payer: Self-pay | Admitting: Physical Therapy

## 2021-08-29 ENCOUNTER — Ambulatory Visit: Payer: Medicare HMO | Admitting: Physical Therapy

## 2021-08-29 ENCOUNTER — Other Ambulatory Visit: Payer: Self-pay

## 2021-08-29 DIAGNOSIS — R2681 Unsteadiness on feet: Secondary | ICD-10-CM

## 2021-08-29 DIAGNOSIS — R262 Difficulty in walking, not elsewhere classified: Secondary | ICD-10-CM

## 2021-08-29 DIAGNOSIS — R269 Unspecified abnormalities of gait and mobility: Secondary | ICD-10-CM | POA: Diagnosis not present

## 2021-08-29 DIAGNOSIS — R278 Other lack of coordination: Secondary | ICD-10-CM

## 2021-08-29 DIAGNOSIS — R2689 Other abnormalities of gait and mobility: Secondary | ICD-10-CM

## 2021-08-29 DIAGNOSIS — M6281 Muscle weakness (generalized): Secondary | ICD-10-CM

## 2021-08-29 NOTE — Patient Instructions (Signed)
Access Code: [email or web address] URL: [email or web address] Date: 08/29/2021 Prepared by: Blanche East  Exercises Supine Heel Slide - 1 x daily - 7 x weekly - 1 sets - 10 reps Supine Knee Extension Strengthening - 1 x daily - 7 x weekly - 1 sets - 10 reps Small Range Straight Leg Raise - 1 x daily - 7 x weekly - 1 sets - 10 reps Supine Hip Abduction AROM - 1 x daily - 7 x weekly - 1 sets - 10 reps Beginner Bridge - 1 x daily - 7 x weekly - 1 sets - 10 reps

## 2021-08-29 NOTE — Therapy (Signed)
[name] MAIN [facility] SERVICES [address] [place], [place], [address] Phone: [phone]   Fax:  [phone]  Physical Therapy Treatment  Patient Details  Name: [name] MRN: [id number] Date of Birth: 23-Jan-1941 Referring Provider (PT): Sharin Grave   Encounter Date: 08/29/2021   PT End of Session - 08/29/21 1309     Visit Number 21    Number of Visits 89    Date for PT Re-Evaluation 11/14/21    Authorization Type [employer]; Medicaid    Authorization Time Period 1/18-3/31, 24 visits    Authorization - Visit Number 1    Authorization - Number of Visits 24    PT Start Time 1303    PT Stop Time 1345    PT Time Calculation (min) 42 min    Equipment Utilized During Treatment Gait belt    Activity Tolerance Patient tolerated treatment well;Patient limited by fatigue;Patient limited by pain    Behavior During Therapy Flat affect             Past Medical History:  Diagnosis Date   Acute heart failure ([place])    Aortic stenosis    CHF (congestive heart failure) ([place])    Complication of anesthesia    Hard to wake patient up after having anesthesia   Diabetes mellitus without complication ([place])    Diabetic neuropathy ([place])    Takes Lyrica   Edema of both legs    Takes Lasix   GERD (gastroesophageal reflux disease)    High cholesterol    HTN (hypertension)    Hypertension    PAD (peripheral artery disease) ([place])    Shortness of breath dyspnea    Spasm of back muscles    Stroke ([place])    Wound, open    Right great toe    Past Surgical History:  Procedure Laterality Date   AMPUTATION TOE Left 06/09/2019   Procedure: Left third toe and partial great toe amputation and debridement;  Surgeon: Katha Cabal, MD;  Location: [facility];  Service: Vascular;  Laterality: Left;   AMPUTATION TOE Left 04/06/2020   Procedure: AMPUTATION LEFT SECOND TOE;  Surgeon: Samara Deist, DPM;  Location: [facility];  Service: Podiatry;   Laterality: Left;   BYPASS GRAFT POPLITEAL TO TIBIAL Right 02/28/2016   Procedure: BYPASS GRAFT RIGHT BELOW KNEE POPLITEAL TO PERONEAL USING REVERSED RIGHT GREATER SAPHENOUS VEIN;  Surgeon: Conrad Geddes, MD;  Location: [place];  Service: Vascular;  Laterality: Right;   CYST EXCISION     Abdomen   EYE SURGERY Bilateral    Cataract removal   INTRAMEDULLARY (IM) NAIL INTERTROCHANTERIC Left 02/04/2014   Procedure: INTRAMEDULLARY (IM) NAIL INTERTROCHANTRIC FEMORAL;  Surgeon: Mauri Pole, MD;  Location: [place];  Service: Orthopedics;  Laterality: Left;   IR GENERIC HISTORICAL  03/01/2016   IR ANGIO INTRA EXTRACRAN SEL COM CAROTID INNOMINATE UNI R MOD SED 03/01/2016 [name], MD [facility]   IR GENERIC HISTORICAL  03/01/2016   IR ENDOVASC INTRACRANIAL INF OTHER THAN THROMBO ART INC DIAG ANGIO 03/01/2016 [name], MD [facility]   IR GENERIC HISTORICAL  03/01/2016   IR INTRAVSC STENT CERV CAROTID W/O EMB-PROT MOD SED INC ANGIO 03/01/2016 [name], MD [facility]   IR GENERIC HISTORICAL  06/03/2016   IR RADIOLOGIST EVAL & MGMT 06/03/2016 [facility]   LOWER EXTREMITY ANGIOGRAPHY Left 02/09/2019   Procedure: LOWER EXTREMITY ANGIOGRAPHY;  Surgeon: Katha Cabal, MD;  Location: [place] INVASIVE [facility]  LAB;  Service: Cardiovascular;  Laterality: Left;   LOWER EXTREMITY ANGIOGRAPHY Left 03/10/2019   Procedure: LOWER EXTREMITY ANGIOGRAPHY;  Surgeon: Katha Cabal, MD;  Location: [place] CV LAB;  Service: Cardiovascular;  Laterality: Left;   LOWER EXTREMITY ANGIOGRAPHY Left 04/27/2019   Procedure: LOWER EXTREMITY ANGIOGRAPHY;  Surgeon: Katha Cabal, MD;  Location: [place] CV LAB;  Service: Cardiovascular;  Laterality: Left;   LOWER EXTREMITY ANGIOGRAPHY Left 06/08/2019   Procedure: Lower Extremity Angiography;  Surgeon: Katha Cabal, MD;  Location: [place] CV LAB;  Service: Cardiovascular;  Laterality: Left;   LOWER EXTREMITY ANGIOGRAPHY Left 04/03/2020    Procedure: Lower Extremity Angiography;  Surgeon: Algernon Huxley, MD;  Location: [place] CV LAB;  Service: Cardiovascular;  Laterality: Left;   LOWER EXTREMITY ANGIOGRAPHY Right 10/20/2020   Procedure: Lower Extremity Angiography;  Surgeon: Katha Cabal, MD;  Location: [place] CV LAB;  Service: Cardiovascular;  Laterality: Right;   LOWER EXTREMITY ANGIOGRAPHY Left 01/23/2021   Procedure: LOWER EXTREMITY ANGIOGRAPHY;  Surgeon: Katha Cabal, MD;  Location: [place] CV LAB;  Service: Cardiovascular;  Laterality: Left;   LOWER EXTREMITY ANGIOGRAPHY Right 04/10/2021   Procedure: LOWER EXTREMITY ANGIOGRAPHY;  Surgeon: Katha Cabal, MD;  Location: [place] CV LAB;  Service: Cardiovascular;  Laterality: Right;   ORIF TOE FRACTURE Right 02/08/2014   Procedure: OPEN REDUCTION INTERNAL FIXATION Right METATARSAL  FRACTURE ;  Surgeon: Wylene Simmer, MD;  Location: [place];  Service: Orthopedics;  Laterality: Right;   PERIPHERAL VASCULAR CATHETERIZATION N/A 12/28/2015   Procedure: Abdominal Aortogram w/Lower Extremity;  Surgeon: Conrad [place], MD;  Location: [place] [facility] LAB;  Service: Cardiovascular;  Laterality: N/A;   RADIOLOGY WITH ANESTHESIA N/A 03/01/2016   Procedure: RADIOLOGY WITH ANESTHESIA;  Surgeon: [name], MD;  Location: [place];  Service: Radiology;  Laterality: N/A;   VEIN HARVEST Right 02/28/2016   Procedure: RIGHT GREATER SAPHENOUS VEIN HARVEST;  Surgeon: Conrad Gonzales, MD;  Location: [place];  Service: Vascular;  Laterality: Right;    There were no vitals filed for this visit.   Subjective Assessment - 08/29/21 1307     Subjective Patient reports doing fair. She reports a good bit of pain in her bottom;    Patient is accompained by: Family member    Pertinent History 32yoF presenting to [place] to address recent gradual progressive decline in mobility, family goals for improved strength, to promote > participation in transfers to wheelchair and safety  with ADL performance. Recent illness includes [facility] to [facility]03/10/21 due to COVID infection c AMS and fever. Pt then underwent outpatient recanalization/limb salvage of RLE 04/10/2021; LLE salvage performed in June 2022. Other PMH: CHF, DM, HTN, CVA, COPD, multiple remote toe amputations, stg 2 sacral ulcer. PLOF includes 3-4 years bed bound status, hoyerlifts to WC, totalA for ADL performance. Pt is cared for by family, as well as professional caregiver services.    Limitations Sitting;Walking;Lifting;Standing;House hold activities    How long can you sit comfortably? Pt spouse reports 2 hours. Must be careful d/t preventing and trying to heal wounds on bottom.    How long can you stand comfortably? unable    How long can you walk comfortably? Unable    Diagnostic tests 6/21 and 9/06 peripheral vascular catheterizatoin; 04/10/2021 Successful recanalization right lower extremity for limb salvage    Patient Stated Goals to strengthen pt's trunk to improve mobility in chair, assist with ADLs    Currently in  Pain? Yes    Pain Score --   unable to rate, reports increased pain in bottom;   Pain Location Buttocks    Pain Descriptors / Indicators Aching;Sore    Pain Type Chronic pain    Pain Onset More than a month ago    Pain Frequency Intermittent    Aggravating Factors  worse with prolonged sitting    Pain Relieving Factors rest    Effect of Pain on Daily Activities decreased activity tolerance;    Multiple Pain Sites No               TREATMENT:   TA: Patient transferred wheelchair to mat table with hoyer lift requiring +2 for proper positioning for comfort;    At end of session Patient Transferred back to wheelchair using hoyer lift requiring +2 for positioning;      Ex: Instructed patient/caregiver in exercise to facilitate better ROM/strengthening:  Hamstring curl, AAROM x10 reps each LE, patient reports increased knee pain with end range of motion having difficulty  achieving knee flexion;    SAQ x10 reps with pillow under knee for proper positioning SLR hip flexion x10 reps each LE requiring AAROM, patient able to initiate muscle activation but unable to achieve partial ROM Hip abduction/adduction x10 reps with cues to increase ROM for better strengthening;   Bridges x10 reps, therapist assist for positioning and to increase gluteal squeeze; Patient reports moderate difficulty; She was able to exhibit better gluteal activation but is still unable to achieve full ROM;    Patient tolerated session fair. She was able to tolerate increased exercise/ROM but continues to be limited with decreased knee flexion. Provided written HEP for better adherence;  Patient reports being comfortable once repositioned in chair;                                 PT Education - 08/29/21 1437     Education Details positioning, HEP    Person(s) Educated Patient;Spouse    Methods Explanation;Verbal cues;Handout    Comprehension Verbalized understanding;Returned demonstration;Verbal cues required;Need further instruction              PT Short Term Goals - 08/22/21 1308       PT SHORT TERM GOAL #1   Title Patient will be independent in home exercise program to improve strength/mobility for better functional independence with ADLs.    Baseline 10/6: to be initiated, 11/8: caregiver reports she is doing them 3x a day; 1/18: doesn't do much exercise    Time 4    Period Weeks    Status Partially Met    Target Date 09/19/21               PT Long Term Goals - 08/22/21 1309       PT LONG TERM GOAL #1   Title Patient will increase FOTO score to equal to or greater than 44 to demonstrate statistically significant improvement in mobility and quality of life.    Baseline 10/6: 26, 11/8: 12%, 1/18: 12%    Time 12    Period Weeks    Status Not Met    Target Date 11/14/21      PT LONG TERM GOAL #2   Title Patient will roll L and R in bed  with mod A for decreased caregiver burden for ADLs and toileting.    Baseline 10/6: per pt spouse, pt currently dependent, 11/8: mod A,  inconsistent, 1/18: needs min A    Time 12    Period Weeks    Status Partially Met    Target Date 11/14/21      PT LONG TERM GOAL #3   Title Patient will sit upright in WC, with BUE support and without leaning on backrest for 10 minutes for ADL performance and pressure relief.    Baseline 10/6: pt exhibits core contraction but unable to sit upright, 11/8: able to sit upright for <20 sec with mod VCs for erect positioning, 1/18: able to hold for 60 sec with mod VCs    Time 12    Period Weeks    Status Partially Met    Target Date 11/14/21      PT LONG TERM GOAL #4   Title Pt will demonstrate ability to utilize BUEs to weight-shift to each side in WC and maintain weight shift for 30 seconds to assist in pressure relief and reduce risk of wound formation.    Baseline 10/6: pt currently unable, 11/8: unable to initiate weight shift, able to use UE but unable to shift hips without assistance; 1/18: requires max cues and min A for weight shift    Time 12    Period Weeks    Status Partially Met    Target Date 11/14/21                   Plan - 08/29/21 1437     Clinical Impression Statement Patient motivated and participated fair within session. She does require +2 for hoyer lift transfer to mat table. Patient continues to have increased buttocks pain and knee pain with various positions. She was instructed in supine exercise as part of HEP. Patient was able to exhibit better ROM this session compared to previous session. Patient continues to have limited knee flexion ROM. Patient and caregiver provided with written HEP for better adherence. Patient would benefit from additional skilled PT Intervention to improve strength, balance and mobility;    Personal Factors and Comorbidities Age;Time since onset of injury/illness/exacerbation;Comorbidity  3+;Past/Current Experience;Education;Social Background;Transportation;Sex;Fitness    Comorbidities diastolic congestive heart failure, diabetes mellitus, diabetic neuropathy, hypertension, hyperlipidemia, GERD, peripheral vascular disease, CVA. on 9/2 had amputation of L foot second toe MTP joint and LLE revascularization, recent hx of COVID19    Examination-Activity Limitations Bathing;Hygiene/Grooming;Squat;Bed Mobility;Stairs;Lift;Bend;Locomotion Level;Stand;Caring for [employer];Toileting;Carry;Self Feeding;Transfers;Continence;Dressing;Other;Sit    Examination-Participation Restrictions Cleaning;Shop;Community Activity;Meal Prep;Driving;Other    Stability/Clinical Decision Making Evolving/Moderate complexity    Rehab Potential Poor    PT Frequency 2x / week    PT Duration 12 weeks    PT Treatment/Interventions ADLs/Self Care Home Management;DME Instruction;Functional mobility training;Therapeutic activities;Therapeutic exercise;Neuromuscular re-education;Patient/family education;Wheelchair mobility training;Passive range of motion;Energy conservation;Cryotherapy;Biofeedback;Electrical Stimulation;Iontophoresis 6m/ml Dexamethasone;Moist Heat;Traction;Ultrasound;Stair training;Cognitive remediation;Balance training;Orthotic Fit/Training;Manual techniques;Manual lymph drainage;Splinting;Taping;Vasopneumatic Device;Vestibular;Visual/perceptual remediation/compensation;Gait training    PT Next Visit Plan bed mobility, pressure relief techniques;    PT Home Exercise Plan Previous- Seated lateral weight shift pressure relief technique in wheelchair to be performed 5 days/week for 2 sets of 10 repetitions each side.  PT provided patient with picture of technique as well.  05/21/2021- Access Code: [email or web address]   Consulted and Agree with Plan of Care Family member/caregiver    Family Member Consulted Spouse             Patient will benefit from skilled therapeutic intervention in order to  improve the following deficits and impairments:  Decreased cognition, Decreased skin integrity, Impaired sensation, Decreased mobility, Postural dysfunction, Decreased activity tolerance, Decreased endurance,  Decreased range of motion, Decreased strength, Decreased balance, Decreased safety awareness, Increased edema, Impaired flexibility, Decreased coordination, Cardiopulmonary status limiting activity, Abnormal gait, Impaired UE functional use, Impaired tone, Improper body mechanics, Pain, Difficulty walking, Decreased knowledge of use of DME, Hypomobility, Impaired perceived functional ability  Visit Diagnosis: Other abnormalities of gait and mobility  Difficulty in walking, not elsewhere classified  Muscle weakness (generalized)  Unsteadiness on feet  Other lack of coordination     Problem List Patient Active Problem List   Diagnosis Date Noted   Atherosclerosis of native arteries of extremity with rest pain ([place]) 04/20/2021   COVID-19 virus infection 03/07/2021   Acute febrile illness 03/07/2021   Hypoalbuminemia 03/07/2021   Hyperglycemia due to diabetes mellitus ([place]) 03/07/2021   Elevated troponin 03/07/2021   Constipation 03/07/2021   COPD (chronic obstructive pulmonary disease) ([place]) 10/20/2020   GERD (gastroesophageal reflux disease) 10/20/2020   Physical deconditioning 10/20/2020   Cellulitis 10/19/2020   Necrotic toes (H[facility])    Insulin dependent diabetes mellitus type IA ([place])    Diabetic neuropathy (H[facility])    Gangrene of toe of both feet ([place]) 06/06/2019   Peripheral arterial disease ([place]) 04/27/2019   ESBL (extended spectrum beta-lactamase) producing bacteria infection 12/25/2018   PVD (peripheral vascular disease) ([place]) 09/12/2016   Ulcer of right lower extremity ([place]) 08/29/2016   Acute on chronic diastolic heart failure (HCC) 07/08/2016   HTN (hypertension) 07/05/2016   Sensorineural hearing loss (SNHL), 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   Chronic diastolic CHF (congestive  heart failure) ([place]) 03/10/2016   HLD (hyperlipidemia) 03/09/2016   Acute respiratory acidosis ([facility]CC) 03/05/2016   Anemia, iron deficiency 03/03/2016   Cerebrovascular accident (CVA) due to thrombosis of precerebral artery ([place]) 03/01/2016   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 ([place]) 03/01/2016   Atherosclerosis of native arteries of the extremities with gangrene ([place]) 12/08/2015   Anemia, chronic disease 04/27/2014   Unspecified hereditary and idiopathic peripheral neuropathy 03/14/2014   DM2 (diabetes mellitus, type 2) ([place]) 02/14/2014   Aortic stenosis 02/04/2014   Closed left subtrochanteric femur fracture ([place]) 02/03/2014   Hip fracture, left ([place]) 02/03/2014   Fibula fracture 02/03/2014   MTP instability 02/03/2014   Hip fracture ([place]) 02/03/2014    Jalasia Eskridge, PT, DPT 08/29/2021, 2:43 PM  [facility] MAIN [facility] SERVICES [address] [place], [place], [address] Phone: [phone]   Fax:  [phone]  Name: SKYLAR FLYNT MRN: [id number] Date of Birth: [birth year]

## 2021-09-03 ENCOUNTER — Ambulatory Visit: Payer: Medicare HMO | Admitting: Physical Therapy

## 2021-09-03 ENCOUNTER — Encounter: Payer: Self-pay | Admitting: Physical Therapy

## 2021-09-03 ENCOUNTER — Other Ambulatory Visit: Payer: Self-pay

## 2021-09-03 VITALS — BP 136/52 | HR 72

## 2021-09-03 DIAGNOSIS — R269 Unspecified abnormalities of gait and mobility: Secondary | ICD-10-CM | POA: Diagnosis not present

## 2021-09-03 DIAGNOSIS — R2681 Unsteadiness on feet: Secondary | ICD-10-CM

## 2021-09-03 DIAGNOSIS — M6281 Muscle weakness (generalized): Secondary | ICD-10-CM

## 2021-09-03 DIAGNOSIS — R278 Other lack of coordination: Secondary | ICD-10-CM

## 2021-09-03 DIAGNOSIS — R2689 Other abnormalities of gait and mobility: Secondary | ICD-10-CM

## 2021-09-03 DIAGNOSIS — R262 Difficulty in walking, not elsewhere classified: Secondary | ICD-10-CM

## 2021-09-03 NOTE — Therapy (Signed)
[place] [facility] [address] [place], [place], [address] Phone: [phone]   Fax:  [phone]  Physical Therapy Treatment  Patient Details  Name: [name] MRN: [id number] Date of Birth: 1940-09-07 Referring Provider (PT): Sharin Grave   Encounter Date: 09/03/2021   PT End of Session - 09/03/21 1341     Visit Number 22    Number of Visits 62    Date for PT Re-Evaluation 11/14/21    Authorization Type [employer]; Medicaid    Authorization Time Period 1/18-3/31, 24 visits    Authorization - Visit Number 1    Authorization - Number of Visits 24    PT Start Time 1308    PT Stop Time 1341    PT Time Calculation (min) 33 min    Equipment Utilized During Treatment Gait belt    Activity Tolerance Patient limited by fatigue;Patient limited by pain    Behavior During Therapy Flat affect             Past Medical History:  Diagnosis Date   Acute heart failure ([place])    Aortic stenosis    CHF (congestive heart failure) ([place])    Complication of anesthesia    Hard to wake patient up after having anesthesia   Diabetes mellitus without complication ([place])    Diabetic neuropathy ([place])    Takes Lyrica   Edema of both legs    Takes Lasix   GERD (gastroesophageal reflux disease)    High cholesterol    HTN (hypertension)    Hypertension    PAD (peripheral artery disease) ([place])    Shortness of breath dyspnea    Spasm of back muscles    Stroke ([place])    Wound, open    Right great toe    Past Surgical History:  Procedure Laterality Date   AMPUTATION TOE Left 06/09/2019   Procedure: Left third toe and partial great toe amputation and debridement;  Surgeon: Katha Cabal, MD;  Location: [facility];  Service: Vascular;  Laterality: Left;   AMPUTATION TOE Left 04/06/2020   Procedure: AMPUTATION LEFT SECOND TOE;  Surgeon: Samara Deist, DPM;  Location: [facility];  Service: Podiatry;  Laterality: Left;   BYPASS GRAFT  POPLITEAL TO TIBIAL Right 02/28/2016   Procedure: BYPASS GRAFT RIGHT BELOW KNEE POPLITEAL TO PERONEAL USING REVERSED RIGHT GREATER SAPHENOUS VEIN;  Surgeon: Conrad Leasburg, MD;  Location: [place];  Service: Vascular;  Laterality: Right;   CYST EXCISION     Abdomen   EYE SURGERY Bilateral    Cataract removal   INTRAMEDULLARY (IM) NAIL INTERTROCHANTERIC Left 02/04/2014   Procedure: INTRAMEDULLARY (IM) NAIL INTERTROCHANTRIC FEMORAL;  Surgeon: Mauri Pole, MD;  Location: [place];  Service: Orthopedics;  Laterality: Left;   IR GENERIC HISTORICAL  03/01/2016   IR ANGIO INTRA EXTRACRAN SEL COM CAROTID INNOMINATE UNI R MOD SED 03/01/2016 [name], MD [facility]   IR GENERIC HISTORICAL  03/01/2016   IR ENDOVASC INTRACRANIAL INF OTHER THAN THROMBO ART INC DIAG ANGIO 03/01/2016 [name], MD [facility]   IR GENERIC HISTORICAL  03/01/2016   IR INTRAVSC STENT CERV CAROTID W/O EMB-PROT MOD SED INC ANGIO 03/01/2016 [name], MD [facility]   IR GENERIC HISTORICAL  06/03/2016   IR RADIOLOGIST EVAL & MGMT 06/03/2016 [facility]   LOWER EXTREMITY ANGIOGRAPHY Left 02/09/2019   Procedure: LOWER EXTREMITY ANGIOGRAPHY;  Surgeon: Katha Cabal, MD;  Location: [facility];  Service:  Cardiovascular;  Laterality: Left;   LOWER EXTREMITY ANGIOGRAPHY Left 03/10/2019   Procedure: LOWER EXTREMITY ANGIOGRAPHY;  Surgeon: Katha Cabal, MD;  Location: [facility];  Service: Cardiovascular;  Laterality: Left;   LOWER EXTREMITY ANGIOGRAPHY Left 04/27/2019   Procedure: LOWER EXTREMITY ANGIOGRAPHY;  Surgeon: Katha Cabal, MD;  Location: [facility] [place] [facility];  Service: Cardiovascular;  Laterality: Left;   LOWER EXTREMITY ANGIOGRAPHY Left 06/08/2019   Procedure: Lower Extremity Angiography;  Surgeon: Katha Cabal, MD;  Location: [facility];  Service: Cardiovascular;  Laterality: Left;   LOWER EXTREMITY ANGIOGRAPHY Left 04/03/2020   Procedure: Lower Extremity  Angiography;  Surgeon: Algernon Huxley, MD;  Location: [place] [facility];  Service: Cardiovascular;  Laterality: Left;   LOWER EXTREMITY ANGIOGRAPHY Right 10/20/2020   Procedure: Lower Extremity Angiography;  Surgeon: Katha Cabal, MD;  Location: [place] [facility];  Service: Cardiovascular;  Laterality: Right;   LOWER EXTREMITY ANGIOGRAPHY Left 01/23/2021   Procedure: LOWER EXTREMITY ANGIOGRAPHY;  Surgeon: Katha Cabal, MD;  Location: [facility] [facility];  Service: Cardiovascular;  Laterality: Left;   LOWER EXTREMITY ANGIOGRAPHY Right 04/10/2021   Procedure: LOWER EXTREMITY ANGIOGRAPHY;  Surgeon: Katha Cabal, MD;  Location: [facility];  Service: Cardiovascular;  Laterality: Right;   ORIF TOE FRACTURE Right 02/08/2014   Procedure: OPEN REDUCTION INTERNAL FIXATION Right METATARSAL  FRACTURE ;  Surgeon: Wylene Simmer, MD;  Location: [facility];  Service: Orthopedics;  Laterality: Right;   PERIPHERAL VASCULAR CATHETERIZATION N/A 12/28/2015   Procedure: Abdominal Aortogram w/Lower Extremity;  Surgeon: Conrad Olivet, MD;  Location: [facility];  Service: Cardiovascular;  Laterality: N/A;   RADIOLOGY WITH ANESTHESIA N/A 03/01/2016   Procedure: RADIOLOGY WITH ANESTHESIA;  Surgeon: [name], MD;  Location: [facility];  Service: Radiology;  Laterality: N/A;   VEIN HARVEST Right 02/28/2016   Procedure: RIGHT GREATER SAPHENOUS VEIN HARVEST;  Surgeon: Conrad Berkley, MD;  Location: [facility];  Service: Vascular;  Laterality: Right;    Vitals:   09/03/21 1327  BP: (!) 136/52  Pulse: 72     Subjective Assessment - 09/03/21 1326     Subjective Patient reports being sore in her legs. her husband reports she has been more quiet and hasn't been talking much. Husband reports she has been sleeping more.    Patient is accompained by: Family member    Pertinent History 4yoF presenting to [facility] to address recent gradual progressive decline in mobility, family goals for improved strength,  to promote > participation in transfers to wheelchair and safety with ADL performance. Recent illness includes admission to [facility] on 03/07/21-03/10/21 due to COVID infection c AMS and fever. Pt then underwent outpatient recanalization/limb salvage of RLE 04/10/2021; LLE salvage performed in June 2022. Other PMH: CHF, DM, HTN, CVA, COPD, multiple remote toe amputations, stg 2 sacral ulcer. PLOF includes 3-4 years bed bound status, hoyerlifts to WC, totalA for ADL performance. Pt is cared for by family, as well as professional caregiver services.    Limitations Sitting;Walking;Lifting;Standing;House hold activities    How long can you sit comfortably? Pt spouse reports 2 hours. Must be careful d/t preventing and trying to heal wounds on bottom.    How long can you stand comfortably? unable    How long can you walk comfortably? Unable    Diagnostic tests 6/21 and 9/06 peripheral vascular catheterizatoin; 04/10/2021 Successful recanalization right lower extremity for limb salvage    Patient Stated Goals to  strengthen pt's trunk to improve mobility in chair, assist with ADLs    Pain Score --   unable to rate, reports high pain in legs and buttocks;   Pain Location Buttocks    Pain Orientation Lower    Pain Onset More than a month ago    Pain Frequency Intermittent    Aggravating Factors  worse with prolonged sitting    Pain Relieving Factors rest    Effect of Pain on Daily Activities decreased activity tolerance;    Multiple Pain Sites No                 TREATMENT: Currently, patient exhibits a heavy posterior lean likely from chronic bed bound status and poor trunk control   Instructed patient in seated forward weight shift working towards sitting unsupported away from back of chair x5 reps Patient able to hold unsupported sitting with arms on arm rest for 30-60 sec with erect posture. She often falls back against back of chair due to fatigue;   Sitting in chair, forward away from  backrest: -reaching with single UE in various directions x5 reps with min A for trunk control to avoid posterior loss of balance; -hip flexion march x10 reps each LE with AAROM for increased movement; requires min A for trunk support to avoid posterior loss of balance;    Sitting supported in chair: -LAQ x10 reps each LE with cues to slow down LE movement for better motor control and stretch;   Patient required max A +2 for scooting back in wheelchair and repositioning;   Patient tolerated session fair. She seems more lethargic this session. Patient was able to follow commands but requires increased time/effort. She is slower to initiate movement including LE exercise. Vitals were assessed and WNL Recommend caregiver reach out to physician to inform them of new onset fatigue. Caregiver reports no other changes to routine today.                          PT Education - 09/03/21 1341     Education Details positioning/HEP    Person(s) Educated Patient    Methods Explanation;Verbal cues    Comprehension Verbalized understanding;Returned demonstration;Verbal cues required;Need further instruction              PT Short Term Goals - 08/22/21 1308       PT SHORT TERM GOAL #1   Title Patient will be independent in home exercise program to improve strength/mobility for better functional independence with ADLs.    Baseline 10/6: to be initiated, 11/8: caregiver reports she is doing them 3x a day; 1/18: doesn't do much exercise    Time 4    Period Weeks    Status Partially Met    Target Date 09/19/21               PT Long Term Goals - 08/22/21 1309       PT LONG TERM GOAL #1   Title Patient will increase FOTO score to equal to or greater than 44 to demonstrate statistically significant improvement in mobility and quality of life.    Baseline 10/6: 26, 11/8: 12%, 1/18: 12%    Time 12    Period Weeks    Status Not Met    Target Date 11/14/21      PT LONG  TERM GOAL #2   Title Patient will roll L and R in bed with mod A for decreased caregiver burden  for ADLs and toileting.    Baseline 10/6: per pt spouse, pt currently dependent, 11/8: mod A, inconsistent, 1/18: needs min A    Time 12    Period Weeks    Status Partially Met    Target Date 11/14/21      PT LONG TERM GOAL #3   Title Patient will sit upright in WC, with BUE support and without leaning on backrest for 10 minutes for ADL performance and pressure relief.    Baseline 10/6: pt exhibits core contraction but unable to sit upright, 11/8: able to sit upright for <20 sec with mod VCs for erect positioning, 1/18: able to hold for 60 sec with mod VCs    Time 12    Period Weeks    Status Partially Met    Target Date 11/14/21      PT LONG TERM GOAL #4   Title Pt will demonstrate ability to utilize BUEs to weight-shift to each side in WC and maintain weight shift for 30 seconds to assist in pressure relief and reduce risk of wound formation.    Baseline 10/6: pt currently unable, 11/8: unable to initiate weight shift, able to use UE but unable to shift hips without assistance; 1/18: requires max cues and min A for weight shift    Time 12    Period Weeks    Status Partially Met    Target Date 11/14/21                   Plan - 09/03/21 1342     Clinical Impression Statement Patient more lethargic today. She had a harder time participating in session often falling backwards in chair requiring increased cues/encouragement to following instruction. Instructed patient in LE strengthening in seated position for better tolerance. Vitals were assessed and were WNL. Caregiver reports no other changes other than fatigue. Patient did her usual routine this morning but caregiver reports they were rushed. Recommend caregiver reach out to physican regarding worsening fatigue. Session limited to tolerance;    Personal Factors and Comorbidities Age;Time since onset of  injury/illness/exacerbation;Comorbidity 3+;Past/Current Experience;Education;Social Background;Transportation;Sex;Fitness    Comorbidities diastolic congestive heart failure, diabetes mellitus, diabetic neuropathy, hypertension, hyperlipidemia, GERD, peripheral vascular disease, CVA. on 9/2 had amputation of L foot second toe MTP joint and LLE revascularization, recent hx of COVID19    Examination-Activity Limitations Bathing;Hygiene/Grooming;Squat;Bed Mobility;Stairs;Lift;Bend;Locomotion Level;Stand;Caring for [employer];Toileting;Carry;Self Feeding;Transfers;Continence;Dressing;Other;Sit    Examination-Participation Restrictions Cleaning;Shop;Community Activity;Meal Prep;Driving;Other    Stability/Clinical Decision Making Evolving/Moderate complexity    Rehab Potential Poor    PT Frequency 2x / week    PT Duration 12 weeks    PT Treatment/Interventions ADLs/Self Care Home Management;DME Instruction;Functional mobility training;Therapeutic activities;Therapeutic exercise;Neuromuscular re-education;Patient/family education;Wheelchair mobility training;Passive range of motion;Energy conservation;Cryotherapy;Biofeedback;Electrical Stimulation;Iontophoresis 26m/ml Dexamethasone;Moist Heat;Traction;Ultrasound;Stair training;Cognitive remediation;Balance training;Orthotic Fit/Training;Manual techniques;Manual lymph drainage;Splinting;Taping;Vasopneumatic Device;Vestibular;Visual/perceptual remediation/compensation;Gait training    PT Next Visit Plan bed mobility, pressure relief techniques;    PT Home Exercise Plan Previous- Seated lateral weight shift pressure relief technique in wheelchair to be performed 5 days/week for 2 sets of 10 repetitions each side.  PT provided patient with picture of technique as well.  05/21/2021- Access Code: [email or web address]   Consulted and Agree with Plan of Care Family member/caregiver    Family Member Consulted Spouse             Patient will benefit from skilled  therapeutic intervention in order to improve the following deficits and impairments:  Decreased cognition, Decreased skin integrity, Impaired sensation, Decreased mobility, Postural  dysfunction, Decreased activity tolerance, Decreased endurance, Decreased range of motion, Decreased strength, Decreased balance, Decreased safety awareness, Increased edema, Impaired flexibility, Decreased coordination, Cardiopulmonary status limiting activity, Abnormal gait, Impaired UE functional use, Impaired tone, Improper body mechanics, Pain, Difficulty walking, Decreased knowledge of use of DME, Hypomobility, Impaired perceived functional ability  Visit Diagnosis: Other abnormalities of gait and mobility  Difficulty in walking, not elsewhere classified  Muscle weakness (generalized)  Unsteadiness on feet  Other lack of coordination     Problem List Patient Active Problem List   Diagnosis Date Noted   Atherosclerosis of native arteries of extremity with rest pain ([place]) 04/20/2021   COVID-19 virus infection 03/07/2021   Acute febrile illness 03/07/2021   Hypoalbuminemia 03/07/2021   Hyperglycemia due to diabetes mellitus ([place]) 03/07/2021   Elevated troponin 03/07/2021   Constipation 03/07/2021   COPD (chronic obstructive pulmonary disease) ([place]) 10/20/2020   GERD (gastroesophageal reflux disease) 10/20/2020   Physical deconditioning 10/20/2020   Cellulitis 10/19/2020   Necrotic toes (H[facility])    Insulin dependent diabetes mellitus type IA ([place])    Diabetic neuropathy (H[facility])    Gangrene of toe of both feet ([place]) 06/06/2019   Peripheral arterial disease ([place]) 04/27/2019   ESBL (extended spectrum beta-lactamase) producing bacteria infection 12/25/2018   PVD (peripheral vascular disease) ([place]) 09/12/2016   Ulcer of right lower extremity ([place]) 08/29/2016   Acute on chronic diastolic heart failure ([place]) 07/08/2016   HTN (hypertension) 07/05/2016   Sensorineural hearing loss (SNHL), 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    Chronic diastolic CHF (congestive heart failure) ([place]) 03/10/2016   HLD (hyperlipidemia) 03/09/2016   Acute respiratory acidosis ([facility]) 03/05/2016   Anemia, iron deficiency 03/03/2016   Cerebrovascular accident (CVA) due to thrombosis of precerebral artery ([place]) 03/01/2016   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 ([place]) 03/01/2016   Atherosclerosis of native arteries of the extremities with gangrene ([place]) 12/08/2015   Anemia, chronic disease 04/27/2014   Unspecified hereditary and idiopathic peripheral neuropathy 03/14/2014   DM2 (diabetes mellitus, type 2) ([place]) 02/14/2014   Aortic stenosis 02/04/2014   Closed left subtrochanteric femur fracture ([place]) 02/03/2014   Hip fracture, left ([place]) 02/03/2014   Fibula fracture 02/03/2014   MTP instability 02/03/2014   Hip fracture ([place]) 02/03/2014    Hikari Tripp, PT, DPT 09/03/2021, 1:59 PM  [place], [place], [address] Phone: [phone]   Fax:  [phone]  Name: [name] MRN: [id number] Date of Birth: 10-Jan-1941

## 2021-09-05 ENCOUNTER — Other Ambulatory Visit: Payer: Self-pay

## 2021-09-05 ENCOUNTER — Ambulatory Visit: Payer: Medicare HMO | Attending: Family Medicine | Admitting: Physical Therapy

## 2021-09-05 ENCOUNTER — Encounter: Payer: Self-pay | Admitting: Physical Therapy

## 2021-09-05 DIAGNOSIS — R262 Difficulty in walking, not elsewhere classified: Secondary | ICD-10-CM | POA: Insufficient documentation

## 2021-09-05 DIAGNOSIS — R293 Abnormal posture: Secondary | ICD-10-CM | POA: Insufficient documentation

## 2021-09-05 DIAGNOSIS — R2689 Other abnormalities of gait and mobility: Secondary | ICD-10-CM | POA: Diagnosis not present

## 2021-09-05 DIAGNOSIS — M6281 Muscle weakness (generalized): Secondary | ICD-10-CM | POA: Insufficient documentation

## 2021-09-05 DIAGNOSIS — R269 Unspecified abnormalities of gait and mobility: Secondary | ICD-10-CM | POA: Diagnosis present

## 2021-09-05 DIAGNOSIS — R2681 Unsteadiness on feet: Secondary | ICD-10-CM | POA: Diagnosis present

## 2021-09-05 DIAGNOSIS — R278 Other lack of coordination: Secondary | ICD-10-CM | POA: Diagnosis present

## 2021-09-05 NOTE — Therapy (Signed)
[place] MAIN [facility] SERVICES [address] [place], [place], [address] Phone: [phone]   Fax:  [phone]  Physical Therapy Treatment  Patient Details  Name: [name] MRN: [id number] Date of Birth: [birth year] Referring Provider (PT): Sharin Grave   Encounter Date: 09/05/2021   PT End of Session - 09/05/21 1518     Visit Number 23    Number of Visits 44    Date for PT Re-Evaluation 11/14/21    Authorization Type [employer]; Medicaid    Authorization Time Period 1/18-3/31, 24 visits    Authorization - Visit Number 1    Authorization - Number of Visits 24    PT Start Time 1446    PT Stop Time 1515    PT Time Calculation (min) 29 min    Equipment Utilized During Treatment Gait belt    Activity Tolerance Patient limited by fatigue;Patient limited by pain    Behavior During Therapy Flat affect             Past Medical History:  Diagnosis Date   Acute heart failure ([place])    Aortic stenosis    CHF (congestive heart failure) ([place])    Complication of anesthesia    Hard to wake patient up after having anesthesia   Diabetes mellitus without complication ([place])    Diabetic neuropathy ([place])    Takes Lyrica   Edema of both legs    Takes Lasix   GERD (gastroesophageal reflux disease)    High cholesterol    HTN (hypertension)    Hypertension    PAD (peripheral artery disease) ([place])    Shortness of breath dyspnea    Spasm of back muscles    Stroke ([place])    Wound, open    Right great toe    Past Surgical History:  Procedure Laterality Date   AMPUTATION TOE Left 06/09/2019   Procedure: Left third toe and partial great toe amputation and debridement;  Surgeon: Katha Cabal, MD;  Location: [facility];  Service: Vascular;  Laterality: Left;   AMPUTATION TOE Left 04/06/2020   Procedure: AMPUTATION LEFT SECOND TOE;  Surgeon: Samara Deist, DPM;  Location: [facility];  Service: Podiatry;  Laterality: Left;   BYPASS GRAFT  POPLITEAL TO TIBIAL Right 02/28/2016   Procedure: BYPASS GRAFT RIGHT BELOW KNEE POPLITEAL TO PERONEAL USING REVERSED RIGHT GREATER SAPHENOUS VEIN;  Surgeon: Conrad [place]lina, MD;  Location: [place];  Service: Vascular;  Laterality: Right;   CYST EXCISION     Abdomen   EYE SURGERY Bilateral    Cataract removal   INTRAMEDULLARY (IM) NAIL INTERTROCHANTERIC Left 02/04/2014   Procedure: INTRAMEDULLARY (IM) NAIL INTERTROCHANTRIC FEMORAL;  Surgeon: Mauri Pole, MD;  Location: [place];  Service: Orthopedics;  Laterality: Left;   IR GENERIC HISTORICAL  03/01/2016   IR ANGIO INTRA EXTRACRAN SEL COM CAROTID INNOMINATE UNI R MOD SED 03/01/2016 [name], MD [facility]   IR GENERIC HISTORICAL  03/01/2016   IR ENDOVASC INTRACRANIAL INF OTHER THAN THROMBO ART INC DIAG ANGIO 03/01/2016 [name], MD [facility]   IR GENERIC HISTORICAL  03/01/2016   IR INTRAVSC STENT CERV CAROTID W/O EMB-PROT MOD SED INC ANGIO 03/01/2016 [name], MD [facility]   IR GENERIC HISTORICAL  06/03/2016   IR RADIOLOGIST EVAL & MGMT 06/03/2016 [facility]   LOWER EXTREMITY ANGIOGRAPHY Left 02/09/2019   Procedure: LOWER EXTREMITY ANGIOGRAPHY;  Surgeon: Katha Cabal, MD;  Location: [place] [facility];  Service:  Cardiovascular;  Laterality: Left;   LOWER EXTREMITY ANGIOGRAPHY Left 03/10/2019   Procedure: LOWER EXTREMITY ANGIOGRAPHY;  Surgeon: Katha Cabal, MD;  Location: [place] [facility];  Service: Cardiovascular;  Laterality: Left;   LOWER EXTREMITY ANGIOGRAPHY Left 04/27/2019   Procedure: LOWER EXTREMITY ANGIOGRAPHY;  Surgeon: Katha Cabal, MD;  Location: [place] [facility];  Service: Cardiovascular;  Laterality: Left;   LOWER EXTREMITY ANGIOGRAPHY Left 06/08/2019   Procedure: Lower Extremity Angiography;  Surgeon: Katha Cabal, MD;  Location: [place] [facility];  Service: Cardiovascular;  Laterality: Left;   LOWER EXTREMITY ANGIOGRAPHY Left 04/03/2020   Procedure: Lower Extremity  Angiography;  Surgeon: Algernon Huxley, MD;  Location: [place] [facility] LAB;  Service: Cardiovascular;  Laterality: Left;   LOWER EXTREMITY ANGIOGRAPHY Right 10/20/2020   Procedure: Lower Extremity Angiography;  Surgeon: Katha Cabal, MD;  Location: [place] [facility];  Service: Cardiovascular;  Laterality: Right;   LOWER EXTREMITY ANGIOGRAPHY Left 01/23/2021   Procedure: LOWER EXTREMITY ANGIOGRAPHY;  Surgeon: Katha Cabal, MD;  Location: [place] [facility];  Service: Cardiovascular;  Laterality: Left;   LOWER EXTREMITY ANGIOGRAPHY Right 04/10/2021   Procedure: LOWER EXTREMITY ANGIOGRAPHY;  Surgeon: Katha Cabal, MD;  Location: [place] [facility] LAB;  Service: Cardiovascular;  Laterality: Right;   ORIF TOE FRACTURE Right 02/08/2014   Procedure: OPEN REDUCTION INTERNAL FIXATION Right METATARSAL  FRACTURE ;  Surgeon: Wylene Simmer, MD;  Location: [place];  Service: Orthopedics;  Laterality: Right;   PERIPHERAL VASCULAR CATHETERIZATION N/A 12/28/2015   Procedure: Abdominal Aortogram w/Lower Extremity;  Surgeon: Conrad [place], MD;  Location: [place] [facility];  Service: Cardiovascular;  Laterality: N/A;   RADIOLOGY WITH ANESTHESIA N/A 03/01/2016   Procedure: RADIOLOGY WITH ANESTHESIA;  Surgeon: [name], MD;  Location: [place];  Service: Radiology;  Laterality: N/A;   VEIN HARVEST Right 02/28/2016   Procedure: RIGHT GREATER SAPHENOUS VEIN HARVEST;  Surgeon: Conrad [place], MD;  Location: [place];  Service: Vascular;  Laterality: Right;    There were no vitals filed for this visit.   Subjective Assessment - 09/05/21 1448     Subjective Patient reports her legs are hurting a little bit; Caregiver reports patient has been more alert;    Patient is accompained by: Family member    Pertinent History 48yoF presenting to [place] to address recent gradual progressive decline in mobility, family goals for improved strength, to promote > participation in transfers to wheelchair and safety with ADL  performance. Recent illness includes admission to [facility] on 03/07/21-03/10/21 due to COVID infection c AMS and fever. Pt then underwent outpatient recanalization/limb salvage of RLE 04/10/2021; LLE salvage performed in June 2022. Other PMH: CHF, DM, HTN, CVA, COPD, multiple remote toe amputations, stg 2 sacral ulcer. PLOF includes 3-4 years bed bound status, hoyerlifts to WC, totalA for ADL performance. Pt is cared for by family, as well as professional caregiver services.    Limitations Sitting;Walking;Lifting;Standing;House hold activities    How long can you sit comfortably? Pt spouse reports 2 hours. Must be careful d/t preventing and trying to heal wounds on bottom.    How long can you stand comfortably? unable    How long can you walk comfortably? Unable    Diagnostic tests 6/21 and 9/06 peripheral vascular catheterizatoin; 04/10/2021 Successful recanalization right lower extremity for limb salvage    Patient Stated Goals to strengthen pt's trunk to improve mobility in chair, assist with ADLs    Currently in Pain?  Yes    Pain Score --   unable to rate, but states " a little"   Pain Location Leg    Pain Orientation Lower    Pain Descriptors / Indicators Aching;Sore    Pain Type Chronic pain    Pain Onset More than a month ago    Pain Frequency Intermittent    Aggravating Factors  worse with prolonged sitting    Pain Relieving Factors rest    Effect of Pain on Daily Activities decreased activity tolerance;    Multiple Pain Sites No                 TREATMENT: Currently, patient exhibits a heavy posterior lean likely from chronic bed bound status and poor trunk control   Instructed patient in seated forward weight shift working towards sitting unsupported away from back of chair x5 reps Patient able to hold unsupported sitting with arms on arm rest for 30-60 sec with erect posture. She often falls back against back of chair due to fatigue;   Sitting in chair, forward away from  backrest: -reaching with single UE in various directions x5 reps with min A for trunk control to avoid posterior loss of balance; -hip flexion march x10 reps each LE with AAROM for increased movement; requires min A for trunk support to avoid posterior loss of balance;    Sitting supported in chair: -LAQ x10 reps each LE with cues to slow down LE movement for better motor control and stretch;  Instructed patient in sit<>Stand with standard walker, max A +2 with cues for hand placement and positioning. Patient had difficulty with forward weight shift. She was able to hold standing for approximately 10-15 sec; Pt required seated rest break She stood again for 2-3 sec with heavy posterior lean being unable to shift weight forward and unable to activate gluteal muscle;     Patient required max A +2 for scooting back in wheelchair and repositioning;   Patient tolerated session fair. She was more alert and able to follow commands. She continues to fatigue quickly and complains of buttocks pain with prolonged sitting. Patient was able to stand for short period of time with max A +2 for transfer and mod A +2 to hold standing position. She was able to initiate gluteal activation this session which is improvement from previous sessions;                             PT Education - 09/05/21 1517     Education Details positioning/HEP    Person(s) Educated Patient    Methods Explanation;Verbal cues    Comprehension Verbalized understanding;Returned demonstration;Verbal cues required;Need further instruction              PT Short Term Goals - 08/22/21 1308       PT SHORT TERM GOAL #1   Title Patient will be independent in home exercise program to improve strength/mobility for better functional independence with ADLs.    Baseline 10/6: to be initiated, 11/8: caregiver reports she is doing them 3x a day; 1/18: doesn't do much exercise    Time 4    Period Weeks    Status  Partially Met    Target Date 09/19/21               PT Long Term Goals - 08/22/21 1309       PT LONG TERM GOAL #1   Title Patient will increase  FOTO score to equal to or greater than 44 to demonstrate statistically significant improvement in mobility and quality of life.    Baseline 10/6: 26, 11/8: 12%, 1/18: 12%    Time 12    Period Weeks    Status Not Met    Target Date 11/14/21      PT LONG TERM GOAL #2   Title Patient will roll L and R in bed with mod A for decreased caregiver burden for ADLs and toileting.    Baseline 10/6: per pt spouse, pt currently dependent, 11/8: mod A, inconsistent, 1/18: needs min A    Time 12    Period Weeks    Status Partially Met    Target Date 11/14/21      PT LONG TERM GOAL #3   Title Patient will sit upright in WC, with BUE support and without leaning on backrest for 10 minutes for ADL performance and pressure relief.    Baseline 10/6: pt exhibits core contraction but unable to sit upright, 11/8: able to sit upright for <20 sec with mod VCs for erect positioning, 1/18: able to hold for 60 sec with mod VCs    Time 12    Period Weeks    Status Partially Met    Target Date 11/14/21      PT LONG TERM GOAL #4   Title Pt will demonstrate ability to utilize BUEs to weight-shift to each side in WC and maintain weight shift for 30 seconds to assist in pressure relief and reduce risk of wound formation.    Baseline 10/6: pt currently unable, 11/8: unable to initiate weight shift, able to use UE but unable to shift hips without assistance; 1/18: requires max cues and min A for weight shift    Time 12    Period Weeks    Status Partially Met    Target Date 11/14/21                   Plan - 09/05/21 1518     Clinical Impression Statement Patient motivated and participated well within session. She was more alert this session. She did come late to session which limited skilled PT intervention. Patient was instructed in seated exercise.  Progressed to standing with mod-max A +2. Patient was able to initiate some gluteal activation in standing with better LE weight bearing compared to previous sessions. However she does fatigue quickly requiring seated rest break. Patient is limited with knee contractures being unable to tolerate increased knee flexion ROM. She would benefit from additional skilled PT Intervention to improve strength, balance and mobility;    Personal Factors and Comorbidities Age;Time since onset of injury/illness/exacerbation;Comorbidity 3+;Past/Current Experience;Education;Social Background;Transportation;Sex;Fitness    Comorbidities diastolic congestive heart failure, diabetes mellitus, diabetic neuropathy, hypertension, hyperlipidemia, GERD, peripheral vascular disease, CVA. on 9/2 had amputation of L foot second toe MTP joint and LLE revascularization, recent hx of COVID19    Examination-Activity Limitations Bathing;Hygiene/Grooming;Squat;Bed Mobility;Stairs;Lift;Bend;Locomotion Level;Stand;Caring for [employer];Toileting;Carry;Self Feeding;Transfers;Continence;Dressing;Other;Sit    Examination-Participation Restrictions Cleaning;Shop;Community Activity;Meal Prep;Driving;Other    Stability/Clinical Decision Making Evolving/Moderate complexity    Rehab Potential Poor    PT Frequency 2x / week    PT Duration 12 weeks    PT Treatment/Interventions ADLs/Self Care Home Management;DME Instruction;Functional mobility training;Therapeutic activities;Therapeutic exercise;Neuromuscular re-education;Patient/family education;Wheelchair mobility training;Passive range of motion;Energy conservation;Cryotherapy;Biofeedback;Electrical Stimulation;Iontophoresis 29m/ml Dexamethasone;Moist Heat;Traction;Ultrasound;Stair training;Cognitive remediation;Balance training;Orthotic Fit/Training;Manual techniques;Manual lymph drainage;Splinting;Taping;Vasopneumatic Device;Vestibular;Visual/perceptual remediation/compensation;Gait  training    PT Next Visit Plan bed mobility, pressure relief techniques;    PT  Home Exercise Plan Previous- Seated lateral weight shift pressure relief technique in wheelchair to be performed 5 days/week for 2 sets of 10 repetitions each side.  PT provided patient with picture of technique as well.  05/21/2021- Access Code: [email or web address]    Consulted and Agree with Plan of Care Family member/caregiver    Family Member Consulted Spouse             Patient will benefit from skilled therapeutic intervention in order to improve the following deficits and impairments:  Decreased cognition, Decreased skin integrity, Impaired sensation, Decreased mobility, Postural dysfunction, Decreased activity tolerance, Decreased endurance, Decreased range of motion, Decreased strength, Decreased balance, Decreased safety awareness, Increased edema, Impaired flexibility, Decreased coordination, Cardiopulmonary status limiting activity, Abnormal gait, Impaired UE functional use, Impaired tone, Improper body mechanics, Pain, Difficulty walking, Decreased knowledge of use of DME, Hypomobility, Impaired perceived functional ability  Visit Diagnosis: Other abnormalities of gait and mobility  Difficulty in walking, not elsewhere classified  Muscle weakness (generalized)  Unsteadiness on feet  Other lack of coordination  Abnormality of gait and mobility  Abnormal posture     Problem List Patient Active Problem List   Diagnosis Date Noted   Atherosclerosis of native arteries of extremity with rest pain ([place]) 04/20/2021   COVID-19 virus infection 03/07/2021   Acute febrile illness 03/07/2021   Hypoalbuminemia 03/07/2021   Hyperglycemia due to diabetes mellitus ([place]) 03/07/2021   Elevated troponin 03/07/2021   Constipation 03/07/2021   COPD (chronic obstructive pulmonary disease) ([employer]) 10/20/2020   GERD (gastroesophageal reflux disease) 10/20/2020   Physical deconditioning 10/20/2020   Cellulitis  10/19/2020   Necrotic toes (HCC)    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)    Diabetic neuropathy (HCC)    Gangrene of toe of both feet ([name]) 06/06/2019   Peripheral arterial disease ([place]) 04/27/2019   ESBL (extended spectrum beta-lactamase) producing bacteria infection 12/25/2018   PVD (peripheral vascular disease) ([place]) 09/12/2016   Ulcer of right lower extremity ([place]) 08/29/2016   Acute on chronic diastolic heart failure ([place]) 07/08/2016   HTN (hypertension) 07/05/2016   Sensorineural hearing loss (SNHL), 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   Chronic diastolic CHF (congestive heart failure) ([place]) 03/10/2016   HLD (hyperlipidemia) 03/09/2016   Acute respiratory acidosis (HCC) 03/05/2016   Anemia, iron deficiency 03/03/2016   Cerebrovascular accident (CVA) due to thrombosis of precerebral artery ([place]) 03/01/2016   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 ([place]) 03/01/2016   Atherosclerosis of native arteries of the extremities with gangrene ([place]) 12/08/2015   Anemia, chronic disease 04/27/2014   Unspecified hereditary and idiopathic peripheral neuropathy 03/14/2014   DM2 (diabetes mellitus, type 2) ([place]) 02/14/2014   Aortic stenosis 02/04/2014   Closed left subtrochanteric femur fracture ([place]) 02/03/2014   Hip fracture, left ([place]) 02/03/2014   Fibula fracture 02/03/2014   MTP instability 02/03/2014   Hip fracture ([place]) 02/03/2014    Phoenyx Paulsen, PT, DPT 09/05/2021, 3:25 PM   [facility] MAIN [facility] SERVICES [address] [place], [place], [address] Phone: [phone]   Fax:  [phone]  Name: [name] MRN: [id number] Date of Birth: 06-Jun-1941

## 2021-09-10 ENCOUNTER — Other Ambulatory Visit: Payer: Self-pay

## 2021-09-10 ENCOUNTER — Ambulatory Visit: Payer: Medicare HMO

## 2021-09-10 DIAGNOSIS — R2689 Other abnormalities of gait and mobility: Secondary | ICD-10-CM | POA: Diagnosis not present

## 2021-09-10 DIAGNOSIS — R262 Difficulty in walking, not elsewhere classified: Secondary | ICD-10-CM

## 2021-09-10 DIAGNOSIS — R269 Unspecified abnormalities of gait and mobility: Secondary | ICD-10-CM

## 2021-09-10 DIAGNOSIS — R2681 Unsteadiness on feet: Secondary | ICD-10-CM

## 2021-09-10 DIAGNOSIS — R278 Other lack of coordination: Secondary | ICD-10-CM

## 2021-09-10 DIAGNOSIS — M6281 Muscle weakness (generalized): Secondary | ICD-10-CM

## 2021-09-10 NOTE — Therapy (Signed)
[place] MAIN [facility] SERVICES [address] [place], [place], [address] Phone: [phone]   Fax:  [phone]  Physical Therapy Treatment  Patient Details  Name: [name] MRN: [id number] Date of Birth: 08-19-1940 Referring Provider (PT): Sharin Grave   Encounter Date: 09/10/2021   PT End of Session - 09/10/21 1014     Visit Number 24    Number of Visits 9    Date for PT Re-Evaluation 11/14/21    Authorization Type [employer]; Medicaid    Authorization Time Period 1/18-3/31, 24 visits    Authorization - Visit Number 1    Authorization - Number of Visits 24    PT Start Time 1430    PT Stop Time 1512    PT Time Calculation (min) 42 min    Equipment Utilized During Treatment Gait belt    Activity Tolerance Patient limited by fatigue;Patient limited by pain    Behavior During Therapy Flat affect             Past Medical History:  Diagnosis Date   Acute heart failure ([place])    Aortic stenosis    CHF (congestive heart failure) ([place])    Complication of anesthesia    Hard to wake patient up after having anesthesia   Diabetes mellitus without complication ([place])    Diabetic neuropathy ([place])    Takes Lyrica   Edema of both legs    Takes Lasix   GERD (gastroesophageal reflux disease)    High cholesterol    HTN (hypertension)    Hypertension    PAD (peripheral artery disease) ([place])    Shortness of breath dyspnea    Spasm of back muscles    Stroke ([place])    Wound, open    Right great toe    Past Surgical History:  Procedure Laterality Date   AMPUTATION TOE Left 06/09/2019   Procedure: Left third toe and partial great toe amputation and debridement;  Surgeon: Katha Cabal, MD;  Location: [facility];  Service: Vascular;  Laterality: Left;   AMPUTATION TOE Left 04/06/2020   Procedure: AMPUTATION LEFT SECOND TOE;  Surgeon: Samara Deist, DPM;  Location: [facility];  Service: Podiatry;  Laterality: Left;   BYPASS GRAFT  POPLITEAL TO TIBIAL Right 02/28/2016   Procedure: BYPASS GRAFT RIGHT BELOW KNEE POPLITEAL TO PERONEAL USING REVERSED RIGHT GREATER SAPHENOUS VEIN;  Surgeon: Conrad Westfield, MD;  Location: [place];  Service: Vascular;  Laterality: Right;   CYST EXCISION     Abdomen   EYE SURGERY Bilateral    Cataract removal   INTRAMEDULLARY (IM) NAIL INTERTROCHANTERIC Left 02/04/2014   Procedure: INTRAMEDULLARY (IM) NAIL INTERTROCHANTRIC FEMORAL;  Surgeon: Mauri Pole, MD;  Location: [place];  Service: Orthopedics;  Laterality: Left;   IR GENERIC HISTORICAL  03/01/2016   IR ANGIO INTRA EXTRACRAN SEL COM CAROTID INNOMINATE UNI R MOD SED 03/01/2016 [name], MD [facility]   IR GENERIC HISTORICAL  03/01/2016   IR ENDOVASC INTRACRANIAL INF OTHER THAN THROMBO ART INC DIAG ANGIO 03/01/2016 [name], MD [facility]   IR GENERIC HISTORICAL  03/01/2016   IR INTRAVSC STENT CERV CAROTID W/O EMB-PROT MOD SED INC ANGIO 03/01/2016 [name], MD [facility]   IR GENERIC HISTORICAL  06/03/2016   IR RADIOLOGIST EVAL & MGMT 06/03/2016 [facility]   LOWER EXTREMITY ANGIOGRAPHY Left 02/09/2019   Procedure: LOWER EXTREMITY ANGIOGRAPHY;  Surgeon: Katha Cabal, MD;  Location: [place] [facility];  Service:  Cardiovascular;  Laterality: Left;   LOWER EXTREMITY ANGIOGRAPHY Left 03/10/2019   Procedure: LOWER EXTREMITY ANGIOGRAPHY;  Surgeon: Katha Cabal, MD;  Location: [place] [facility];  Service: Cardiovascular;  Laterality: Left;   LOWER EXTREMITY ANGIOGRAPHY Left 04/27/2019   Procedure: LOWER EXTREMITY ANGIOGRAPHY;  Surgeon: Katha Cabal, MD;  Location: [place] [facility];  Service: Cardiovascular;  Laterality: Left;   LOWER EXTREMITY ANGIOGRAPHY Left 06/08/2019   Procedure: Lower Extremity Angiography;  Surgeon: Katha Cabal, MD;  Location: [place] [facility];  Service: Cardiovascular;  Laterality: Left;   LOWER EXTREMITY ANGIOGRAPHY Left 04/03/2020   Procedure: Lower Extremity  Angiography;  Surgeon: Algernon Huxley, MD;  Location: [place] [facility];  Service: Cardiovascular;  Laterality: Left;   LOWER EXTREMITY ANGIOGRAPHY Right 10/20/2020   Procedure: Lower Extremity Angiography;  Surgeon: Katha Cabal, MD;  Location: [place] [facility];  Service: Cardiovascular;  Laterality: Right;   LOWER EXTREMITY ANGIOGRAPHY Left 01/23/2021   Procedure: LOWER EXTREMITY ANGIOGRAPHY;  Surgeon: Katha Cabal, MD;  Location: [place] [facility];  Service: Cardiovascular;  Laterality: Left;   LOWER EXTREMITY ANGIOGRAPHY Right 04/10/2021   Procedure: LOWER EXTREMITY ANGIOGRAPHY;  Surgeon: Katha Cabal, MD;  Location: [place] [facility];  Service: Cardiovascular;  Laterality: Right;   ORIF TOE FRACTURE Right 02/08/2014   Procedure: OPEN REDUCTION INTERNAL FIXATION Right METATARSAL  FRACTURE ;  Surgeon: Wylene Simmer, MD;  Location: [place];  Service: Orthopedics;  Laterality: Right;   PERIPHERAL VASCULAR CATHETERIZATION N/A 12/28/2015   Procedure: Abdominal Aortogram w/Lower Extremity;  Surgeon: Conrad [place] Run, MD;  Location: [place] [facility];  Service: Cardiovascular;  Laterality: N/A;   RADIOLOGY WITH ANESTHESIA N/A 03/01/2016   Procedure: RADIOLOGY WITH ANESTHESIA;  Surgeon: [name], MD;  Location: [place];  Service: Radiology;  Laterality: N/A;   VEIN HARVEST Right 02/28/2016   Procedure: RIGHT GREATER SAPHENOUS VEIN HARVEST;  Surgeon: Conrad Maltby, MD;  Location: [place];  Service: Vascular;  Laterality: Right;    There were no vitals filed for this visit.   Subjective Assessment - 09/10/21 1013     Subjective Patient reports no changes since last visit. Denies any falls; reports sore bottom.    Patient is accompained by: Family member    Pertinent History 82yoF presenting to [place] to address recent gradual progressive decline in mobility, family goals for improved strength, to promote > participation in transfers to wheelchair and safety with ADL performance.  Recent illness includes admission to [facility] on 03/07/21-03/10/21 due to COVID infection c AMS and fever. Pt then underwent outpatient recanalization/limb salvage of RLE 04/10/2021; LLE salvage performed in June 2022. Other PMH: CHF, DM, HTN, CVA, COPD, multiple remote toe amputations, stg 2 sacral ulcer. PLOF includes 3-4 years bed bound status, hoyerlifts to WC, totalA for ADL performance. Pt is cared for by family, as well as professional caregiver services.    Limitations Sitting;Walking;Lifting;Standing;House hold activities    How long can you sit comfortably? Pt spouse reports 2 hours. Must be careful d/t preventing and trying to heal wounds on bottom.    How long can you stand comfortably? unable    How long can you walk comfortably? Unable    Diagnostic tests 6/21 and 9/06 peripheral vascular catheterizatoin; 04/10/2021 Successful recanalization right lower extremity for limb salvage    Patient Stated Goals to strengthen pt's trunk to improve mobility in chair, assist with ADLs    Currently in Pain? Yes  Pain Location Coccyx    Pain Descriptors / Indicators Aching;Sore    Pain Type Chronic pain    Pain Onset More than a month ago    Pain Frequency Intermittent    Aggravating Factors  prolonged sitting               INTERVENTIONS:   *Patient continues to present with posterior/slouch posture with 2 belts fastened to keep her in wheelchair.   Attempted forward trunk leans (Holding patient hands and trying to let her pull on PT forward and assisting as needed) Patient was quick to let go and say "I can't" PT assisted by pulling patient patient forward and able to perform approx 8 times and then patient calls Out - That's enough and last 2 were more max assist.   Therex in chair:   AROM- knee ext B 2 sets of 10 reps - PT placed his hands out forward and instructed patient to touch his hands- Patient able to use visual cued well and even count on her own but required cues to slow down.   AAROM-Hip flex x 10 reps each LE AAROM- Hip abd/add x 10 reps Hip add- Ball squeezes with slow count x 10 reps (patient with minimal press into small red ball) Asssisted UE raises - 10 reps each.     Performed sit<>Stand with max A +1 with cues for hand placement and positioning. Patient exhibited max  difficulty with forward weight shift. She was able to stand for approx 8-10 sec requesting to sit. Unable to forward shift her hips well enough for functional standing. Patient declined to try again and required dependent wheelchair adjustment +2 person to achieve proper placement back into chair.                 PT Education - 09/11/21 1014     Education Details Exercise technique; Transfer technique    Person(s) Educated Patient    Methods Explanation;Demonstration;Tactile cues;Verbal cues    Comprehension Verbalized understanding;Returned demonstration;Verbal cues required;Tactile cues required;Need further instruction              PT Short Term Goals - 08/22/21 1308       PT SHORT TERM GOAL #1   Title Patient will be independent in home exercise program to improve strength/mobility for better functional independence with ADLs.    Baseline 10/6: to be initiated, 11/8: caregiver reports she is doing them 3x a day; 1/18: doesn't do much exercise    Time 4    Period Weeks    Status Partially Met    Target Date 09/19/21               PT Long Term Goals - 08/22/21 1309       PT LONG TERM GOAL #1   Title Patient will increase FOTO score to equal to or greater than 44 to demonstrate statistically significant improvement in mobility and quality of life.    Baseline 10/6: 26, 11/8: 12%, 1/18: 12%    Time 12    Period Weeks    Status Not Met    Target Date 11/14/21      PT LONG TERM GOAL #2   Title Patient will roll L and R in bed with mod A for decreased caregiver burden for ADLs and toileting.    Baseline 10/6: per pt spouse, pt currently dependent, 11/8:  mod A, inconsistent, 1/18: needs min A    Time 12    Period Weeks  Status Partially Met    Target Date 11/14/21      PT LONG TERM GOAL #3   Title Patient will sit upright in WC, with BUE support and without leaning on backrest for 10 minutes for ADL performance and pressure relief.    Baseline 10/6: pt exhibits core contraction but unable to sit upright, 11/8: able to sit upright for <20 sec with mod VCs for erect positioning, 1/18: able to hold for 60 sec with mod VCs    Time 12    Period Weeks    Status Partially Met    Target Date 11/14/21      PT LONG TERM GOAL #4   Title Pt will demonstrate ability to utilize BUEs to weight-shift to each side in WC and maintain weight shift for 30 seconds to assist in pressure relief and reduce risk of wound formation.    Baseline 10/6: pt currently unable, 11/8: unable to initiate weight shift, able to use UE but unable to shift hips without assistance; 1/18: requires max cues and min A for weight shift    Time 12    Period Weeks    Status Partially Met    Target Date 11/14/21                   Plan - 09/10/21 1011     Clinical Impression Statement Patient presents with fair motivation- Responsive to cues but not consistent. She was able to perform LE more actively today but continues to require max assist with standing and all adjustments into wheelchair. She continues to fatigue quickly with inconsistent participation at this time. She would benefit from additional skilled PT Intervention to improve strength, balance and mobility.    Personal Factors and Comorbidities Age;Time since onset of injury/illness/exacerbation;Comorbidity 3+;Past/Current Experience;Education;Social Background;Transportation;Sex;Fitness    Comorbidities diastolic congestive heart failure, diabetes mellitus, diabetic neuropathy, hypertension, hyperlipidemia, GERD, peripheral vascular disease, CVA. on 9/2 had amputation of L foot second toe MTP joint and LLE  revascularization, recent hx of COVID19    Examination-Activity Limitations Bathing;Hygiene/Grooming;Squat;Bed Mobility;Stairs;Lift;Bend;Locomotion Level;Stand;Caring for [employer];Toileting;Carry;Self Feeding;Transfers;Continence;Dressing;Other;Sit    Examination-Participation Restrictions Cleaning;Shop;Community Activity;Meal Prep;Driving;Other    Stability/Clinical Decision Making Evolving/Moderate complexity    Rehab Potential Poor    PT Frequency 2x / week    PT Duration 12 weeks    PT Treatment/Interventions ADLs/Self Care Home Management;DME Instruction;Functional mobility training;Therapeutic activities;Therapeutic exercise;Neuromuscular re-education;Patient/family education;Wheelchair mobility training;Passive range of motion;Energy conservation;Cryotherapy;Biofeedback;Electrical Stimulation;Iontophoresis 75m/ml Dexamethasone;Moist Heat;Traction;Ultrasound;Stair training;Cognitive remediation;Balance training;Orthotic Fit/Training;Manual techniques;Manual lymph drainage;Splinting;Taping;Vasopneumatic Device;Vestibular;Visual/perceptual remediation/compensation;Gait training    PT Next Visit Plan bed mobility, pressure relief techniques; Progressive Therex for strengthening    PT Home Exercise Plan Previous- Seated lateral weight shift pressure relief technique in wheelchair to be performed 5 days/week for 2 sets of 10 repetitions each side.  PT provided patient with picture of technique as well.  05/21/2021- Access Code: [email or web address]   Consulted and Agree with Plan of Care Family member/caregiver    Family Member Consulted Spouse             Patient will benefit from skilled therapeutic intervention in order to improve the following deficits and impairments:  Decreased cognition, Decreased skin integrity, Impaired sensation, Decreased mobility, Postural dysfunction, Decreased activity tolerance, Decreased endurance, Decreased range of motion, Decreased strength, Decreased  balance, Decreased safety awareness, Increased edema, Impaired flexibility, Decreased coordination, Cardiopulmonary status limiting activity, Abnormal gait, Impaired UE functional use, Impaired tone, Improper body mechanics, Pain, Difficulty walking, Decreased knowledge of use of DME, Hypomobility,  Impaired perceived functional ability  Visit Diagnosis: Other lack of coordination  Other abnormalities of gait and mobility  Abnormality of gait and mobility  Difficulty in walking, not elsewhere classified  Muscle weakness (generalized)  Unsteadiness on feet     Problem List Patient Active Problem List   Diagnosis Date Noted   Atherosclerosis of native arteries of extremity with rest pain ([place]) 04/20/2021   COVID-19 virus infection 03/07/2021   Acute febrile illness 03/07/2021   Hypoalbuminemia 03/07/2021   Hyperglycemia due to diabetes mellitus ([place]) 03/07/2021   Elevated troponin 03/07/2021   Constipation 03/07/2021   COPD (chronic obstructive pulmonary disease) ([place]) 10/20/2020   GERD (gastroesophageal reflux disease) 10/20/2020   Physical deconditioning 10/20/2020   Cellulitis 10/19/2020   Necrotic toes ([facility])    Insulin dependent diabetes mellitus type IA ([place])    Diabetic neuropathy ([place])    Gangrene of toe of both feet ([place]) 06/06/2019   Peripheral arterial disease ([place]) 04/27/2019   ESBL (extended spectrum beta-lactamase) producing bacteria infection 12/25/2018   PVD (peripheral vascular disease) ([place]) 09/12/2016   Ulcer of right lower extremity ([place]) 08/29/2016   Acute on chronic diastolic heart failure ([place]) 07/08/2016   HTN (hypertension) 07/05/2016   Sensorineural hearing loss (SNHL), 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   Chronic diastolic CHF (congestive heart failure) ([place]) 03/10/2016   HLD (hyperlipidemia) 03/09/2016   Acute respiratory acidosis ([place]) 03/05/2016   Anemia, iron deficiency 03/03/2016   Cerebrovascular accident (CVA) due to thrombosis of precerebral artery  ([place]) 03/01/2016   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 ([place]) 03/01/2016   Atherosclerosis of native arteries of the extremities with gangrene ([place]) 12/08/2015   Anemia, chronic disease 04/27/2014   Unspecified hereditary and idiopathic peripheral neuropathy 03/14/2014   DM2 (diabetes mellitus, type 2) ([place]) 02/14/2014   Aortic stenosis 02/04/2014   Closed left subtrochanteric femur fracture ([place]) 02/03/2014   Hip fracture, left ([place]) 02/03/2014   Fibula fracture 02/03/2014   MTP instability 02/03/2014   Hip fracture ([place]) 02/03/2014    Lewis Moccasin, PT 09/11/2021, 10:16 AM  [place] MAIN [facility] SERVICES [address] [place], [place], [address] Phone: [phone]   Fax:  [phone]  Name: LORE POLKA MRN: [id number] Date of Birth: May 08, 1941

## 2021-09-13 ENCOUNTER — Other Ambulatory Visit: Payer: Self-pay

## 2021-09-13 ENCOUNTER — Ambulatory Visit: Payer: Medicare HMO

## 2021-09-13 DIAGNOSIS — R262 Difficulty in walking, not elsewhere classified: Secondary | ICD-10-CM

## 2021-09-13 DIAGNOSIS — R2681 Unsteadiness on feet: Secondary | ICD-10-CM

## 2021-09-13 DIAGNOSIS — M6281 Muscle weakness (generalized): Secondary | ICD-10-CM

## 2021-09-13 DIAGNOSIS — R2689 Other abnormalities of gait and mobility: Secondary | ICD-10-CM | POA: Diagnosis not present

## 2021-09-13 NOTE — Therapy (Signed)
[place] MAIN [facility] [address] [place], [place], [address] Phone: [phone]   Fax:  [phone]  Physical Therapy Treatment  Patient Details  Name: [name] MRN: [id number] Date of Birth: June 14, 1941 Referring Provider (PT): Sharin Grave   Encounter Date: 09/13/2021   PT End of Session - 09/13/21 1518     Visit Number 25    Number of Visits 46    Date for PT Re-Evaluation 11/14/21    Authorization Type [employer]; Medicaid    Authorization Time Period 1/18-3/31, 24 visits    Authorization - Visit Number 1    Authorization - Number of Visits 24    PT Start Time 1432    PT Stop Time 1513    PT Time Calculation (min) 41 min    Equipment Utilized During Treatment Gait belt    Activity Tolerance Patient limited by fatigue;Patient limited by pain    Behavior During Therapy Flat affect             Past Medical History:  Diagnosis Date   Acute heart failure ([place])    Aortic stenosis    CHF (congestive heart failure) ([place])    Complication of anesthesia    Hard to wake patient up after having anesthesia   Diabetes mellitus without complication ([place])    Diabetic neuropathy ([place])    Takes Lyrica   Edema of both legs    Takes Lasix   GERD (gastroesophageal reflux disease)    High cholesterol    HTN (hypertension)    Hypertension    PAD (peripheral artery disease) ([place])    Shortness of breath dyspnea    Spasm of back muscles    Stroke ([place])    Wound, open    Right great toe    Past Surgical History:  Procedure Laterality Date   AMPUTATION TOE Left 06/09/2019   Procedure: Left third toe and partial great toe amputation and debridement;  Surgeon: Katha Cabal, MD;  Location: [facility];  Service: Vascular;  Laterality: Left;   AMPUTATION TOE Left 04/06/2020   Procedure: AMPUTATION LEFT SECOND TOE;  Surgeon: Samara Deist, DPM;  Location: [facility];  Service: Podiatry;  Laterality: Left;   BYPASS GRAFT  POPLITEAL TO TIBIAL Right 02/28/2016   Procedure: BYPASS GRAFT RIGHT BELOW KNEE POPLITEAL TO PERONEAL USING REVERSED RIGHT GREATER SAPHENOUS VEIN;  Surgeon: Conrad Gallina, MD;  Location: [place];  Service: Vascular;  Laterality: Right;   CYST EXCISION     Abdomen   EYE SURGERY Bilateral    Cataract removal   INTRAMEDULLARY (IM) NAIL INTERTROCHANTERIC Left 02/04/2014   Procedure: INTRAMEDULLARY (IM) NAIL INTERTROCHANTRIC FEMORAL;  Surgeon: Mauri Pole, MD;  Location: [place];  Service: Orthopedics;  Laterality: Left;   IR GENERIC HISTORICAL  03/01/2016   IR ANGIO INTRA EXTRACRAN SEL COM CAROTID INNOMINATE UNI R MOD SED 03/01/2016 [name], MD [facility]   IR GENERIC HISTORICAL  03/01/2016   IR ENDOVASC INTRACRANIAL INF OTHER THAN THROMBO ART INC DIAG ANGIO 03/01/2016 [name], MD [facility]   IR GENERIC HISTORICAL  03/01/2016   IR INTRAVSC STENT CERV CAROTID W/O EMB-PROT MOD SED INC ANGIO 03/01/2016 [name], MD [facility]   IR GENERIC HISTORICAL  06/03/2016   IR RADIOLOGIST EVAL & MGMT 06/03/2016 [facility]   LOWER EXTREMITY ANGIOGRAPHY Left 02/09/2019   Procedure: LOWER EXTREMITY ANGIOGRAPHY;  Surgeon: Katha Cabal, MD;  Location: [place] [facility];  Service:  Cardiovascular;  Laterality: Left;   LOWER EXTREMITY ANGIOGRAPHY Left 03/10/2019   Procedure: LOWER EXTREMITY ANGIOGRAPHY;  Surgeon: Katha Cabal, MD;  Location: [place] [facility];  Service: Cardiovascular;  Laterality: Left;   LOWER EXTREMITY ANGIOGRAPHY Left 04/27/2019   Procedure: LOWER EXTREMITY ANGIOGRAPHY;  Surgeon: Katha Cabal, MD;  Location: [place] [facility];  Service: Cardiovascular;  Laterality: Left;   LOWER EXTREMITY ANGIOGRAPHY Left 06/08/2019   Procedure: Lower Extremity Angiography;  Surgeon: Katha Cabal, MD;  Location: [place] [facility];  Service: Cardiovascular;  Laterality: Left;   LOWER EXTREMITY ANGIOGRAPHY Left 04/03/2020   Procedure: Lower Extremity  Angiography;  Surgeon: Algernon Huxley, MD;  Location: [place] [facility];  Service: Cardiovascular;  Laterality: Left;   LOWER EXTREMITY ANGIOGRAPHY Right 10/20/2020   Procedure: Lower Extremity Angiography;  Surgeon: Katha Cabal, MD;  Location: [place] [facility];  Service: Cardiovascular;  Laterality: Right;   LOWER EXTREMITY ANGIOGRAPHY Left 01/23/2021   Procedure: LOWER EXTREMITY ANGIOGRAPHY;  Surgeon: Katha Cabal, MD;  Location: [place] [facility];  Service: Cardiovascular;  Laterality: Left;   LOWER EXTREMITY ANGIOGRAPHY Right 04/10/2021   Procedure: LOWER EXTREMITY ANGIOGRAPHY;  Surgeon: Katha Cabal, MD;  Location: [place] [facility];  Service: Cardiovascular;  Laterality: Right;   ORIF TOE FRACTURE Right 02/08/2014   Procedure: OPEN REDUCTION INTERNAL FIXATION Right METATARSAL  FRACTURE ;  Surgeon: Wylene Simmer, MD;  Location: [place] [facility];  Service: Orthopedics;  Laterality: Right;   PERIPHERAL VASCULAR CATHETERIZATION N/A 12/28/2015   Procedure: Abdominal Aortogram w/Lower Extremity;  Surgeon: Conrad [place], MD;  Location: [place] [facility];  Service: Cardiovascular;  Laterality: N/A;   RADIOLOGY WITH ANESTHESIA N/A 03/01/2016   Procedure: RADIOLOGY WITH ANESTHESIA;  Surgeon: [name], MD;  Location: [place];  Service: Radiology;  Laterality: N/A;   VEIN HARVEST Right 02/28/2016   Procedure: RIGHT GREATER SAPHENOUS VEIN HARVEST;  Surgeon: Conrad [place], MD;  Location: [place];  Service: Vascular;  Laterality: Right;    There were no vitals filed for this visit.   Subjective Assessment - 09/13/21 1516     Subjective Patient and husband report no changes since last visit. Husband reports he is trying to work more with her but states she doesn't always wanna work with him.    Patient is accompained by: Family member    Pertinent History 60yoF presenting to [facility] to address recent gradual progressive decline in mobility, family goals for improved strength, to promote >  participation in transfers to wheelchair and safety with ADL performance. Recent illness includes admission to [facility] on 03/07/21-03/10/21 due to COVID infection c AMS and fever. Pt then underwent outpatient recanalization/limb salvage of RLE 04/10/2021; LLE salvage performed in June 2022. Other PMH: CHF, DM, HTN, CVA, COPD, multiple remote toe amputations, stg 2 sacral ulcer. PLOF includes 3-4 years bed bound status, hoyerlifts to WC, totalA for ADL performance. Pt is cared for by family, as well as professional caregiver services.    Limitations Sitting;Walking;Lifting;Standing;House hold activities    How long can you sit comfortably? Pt spouse reports 2 hours. Must be careful d/t preventing and trying to heal wounds on bottom.    How long can you stand comfortably? unable    How long can you walk comfortably? Unable    Diagnostic tests 6/21 and 9/06 peripheral vascular catheterizatoin; 04/10/2021 Successful recanalization right lower extremity for limb salvage    Patient Stated Goals to strengthen pt's trunk to improve  mobility in chair, assist with ADLs    Currently in Pain? No/denies    Pain Onset More than a month ago             INTERVENTIONS:   Therex in wheelchair:  Patient holding onto PVC pipe with min assist- Push/pull 2 sets of 10 reps. VC for encouragement before.   AROM- knee ext B 2 sets of 10 reps -  AAROM-Hip flex 2 sets of 10 reps each LE AAROM- Hip abd  x 10 reps Hip add- Ball squeezes with slow count x 10 reps (patient with minimal press into small red ball) Asssisted UE raises (shoulder flex) - 10 reps each. Diona Foley toss - 2 sets of 10 reps (focusing on hand/eye coordination) - Patient able to catch ball 10/10 times and toss back with fair accuracy         Performed sit<>Stand with max A +1 in // bars with cues for hand placement and positioning. Husband needed to adjust [facility] pad underneath so Patient remained in standing x approx 40 sec with poor ability to bear weight or  extend hips. More of a dependent transfer yet patient did not yell today or complain of any pain.                               PT Education - 09/13/21 1517     Education Details Exercise technique    Person(s) Educated Patient    Methods Explanation;Demonstration;Tactile cues;Verbal cues    Comprehension Verbalized understanding;Returned demonstration;Verbal cues required;Tactile cues required;Need further instruction              PT Short Term Goals - 08/22/21 1308       PT SHORT TERM GOAL #1   Title Patient will be independent in home exercise program to improve strength/mobility for better functional independence with ADLs.    Baseline 10/6: to be initiated, 11/8: caregiver reports she is doing them 3x a day; 1/18: doesn't do much exercise    Time 4    Period Weeks    Status Partially Met    Target Date 09/19/21               PT Long Term Goals - 08/22/21 1309       PT LONG TERM GOAL #1   Title Patient will increase FOTO score to equal to or greater than 44 to demonstrate statistically significant improvement in mobility and quality of life.    Baseline 10/6: 26, 11/8: 12%, 1/18: 12%    Time 12    Period Weeks    Status Not Met    Target Date 11/14/21      PT LONG TERM GOAL #2   Title Patient will roll L and R in bed with mod A for decreased caregiver burden for ADLs and toileting.    Baseline 10/6: per pt spouse, pt currently dependent, 11/8: mod A, inconsistent, 1/18: needs min A    Time 12    Period Weeks    Status Partially Met    Target Date 11/14/21      PT LONG TERM GOAL #3   Title Patient will sit upright in WC, with BUE support and without leaning on backrest for 10 minutes for ADL performance and pressure relief.    Baseline 10/6: pt exhibits core contraction but unable to sit upright, 11/8: able to sit upright for <20 sec with mod VCs for erect positioning,  1/18: able to hold for 60 sec with mod VCs    Time 12    Period  Weeks    Status Partially Met    Target Date 11/14/21      PT LONG TERM GOAL #4   Title Pt will demonstrate ability to utilize BUEs to weight-shift to each side in WC and maintain weight shift for 30 seconds to assist in pressure relief and reduce risk of wound formation.    Baseline 10/6: pt currently unable, 11/8: unable to initiate weight shift, able to use UE but unable to shift hips without assistance; 1/18: requires max cues and min A for weight shift    Time 12    Period Weeks    Status Partially Met    Target Date 11/14/21                   Plan - 09/13/21 1518     Clinical Impression Statement Patient arrived today with fair motivation- able to converse her needs and did not mention any pain before, during, or after session. She was able to increase sets with exercises today and able to stand for longer period of time yet very dependent on physical assist for safety with standing. She would benefit from additional skilled PT Intervention to improve strength, balance and mobility    Personal Factors and Comorbidities Age;Time since onset of injury/illness/exacerbation;Comorbidity 3+;Past/Current Experience;Education;Social Background;Transportation;Sex;Fitness    Comorbidities diastolic congestive heart failure, diabetes mellitus, diabetic neuropathy, hypertension, hyperlipidemia, GERD, peripheral vascular disease, CVA. on 9/2 had amputation of L foot second toe MTP joint and LLE revascularization, recent hx of COVID19    Examination-Activity Limitations Bathing;Hygiene/Grooming;Squat;Bed Mobility;Stairs;Lift;Bend;Locomotion Level;Stand;Caring for [employer];Toileting;Carry;Self Feeding;Transfers;Continence;Dressing;Other;Sit    Examination-Participation Restrictions Cleaning;Shop;Community Activity;Meal Prep;Driving;Other    Stability/Clinical Decision Making Evolving/Moderate complexity    Rehab Potential Poor    PT Frequency 2x / week    PT Duration 12 weeks     PT Treatment/Interventions ADLs/Self Care Home Management;DME Instruction;Functional mobility training;Therapeutic activities;Therapeutic exercise;Neuromuscular re-education;Patient/family education;Wheelchair mobility training;Passive range of motion;Energy conservation;Cryotherapy;Biofeedback;Electrical Stimulation;Iontophoresis 14m/ml Dexamethasone;Moist Heat;Traction;Ultrasound;Stair training;Cognitive remediation;Balance training;Orthotic Fit/Training;Manual techniques;Manual lymph drainage;Splinting;Taping;Vasopneumatic Device;Vestibular;Visual/perceptual remediation/compensation;Gait training    PT Next Visit Plan bed mobility, pressure relief techniques; Progressive Therex for strengthening    PT Home Exercise Plan Previous- Seated lateral weight shift pressure relief technique in wheelchair to be performed 5 days/week for 2 sets of 10 repetitions each side.  PT provided patient with picture of technique as well.  05/21/2021- Access Code: [email or web address]   Consulted and Agree with Plan of Care Family member/caregiver    Family Member Consulted Spouse             Patient will benefit from skilled therapeutic intervention in order to improve the following deficits and impairments:  Decreased cognition, Decreased skin integrity, Impaired sensation, Decreased mobility, Postural dysfunction, Decreased activity tolerance, Decreased endurance, Decreased range of motion, Decreased strength, Decreased balance, Decreased safety awareness, Increased edema, Impaired flexibility, Decreased coordination, Cardiopulmonary status limiting activity, Abnormal gait, Impaired UE functional use, Impaired tone, Improper body mechanics, Pain, Difficulty walking, Decreased knowledge of use of DME, Hypomobility, Impaired perceived functional ability  Visit Diagnosis: Muscle weakness (generalized)  Difficulty in walking, not elsewhere classified  Unsteadiness on feet     Problem List Patient Active Problem List    Diagnosis Date Noted   Atherosclerosis of native arteries of extremity with rest pain ([place] 04/20/2021   COVID-19 virus infection 03/07/2021   Acute febrile illness 03/07/2021   Hypoalbuminemia 03/07/2021  Hyperglycemia due to diabetes mellitus ([place]) 03/07/2021   Elevated troponin 03/07/2021   Constipation 03/07/2021   COPD (chronic obstructive pulmonary disease) ([place]) 10/20/2020   GERD (gastroesophageal reflux disease) 10/20/2020   Physical deconditioning 10/20/2020   Cellulitis 10/19/2020   Necrotic toes ([facility])    Insulin dependent diabetes mellitus type IA ([place])    Diabetic neuropathy ([facility])    Gangrene of toe of both feet ([place]) 06/06/2019   Peripheral arterial disease ([place]) 04/27/2019   ESBL (extended spectrum beta-lactamase) producing bacteria infection 12/25/2018   PVD (peripheral vascular disease) ([place]) 09/12/2016   Ulcer of right lower extremity ([place]) 08/29/2016   Acute on chronic diastolic heart failure ([place]) 07/08/2016   HTN (hypertension) 07/05/2016   Sensorineural hearing loss (SNHL), 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   Chronic diastolic CHF (congestive heart failure) ([place]) 03/10/2016   HLD (hyperlipidemia) 03/09/2016   Acute respiratory acidosis ([place]) 03/05/2016   Anemia, iron deficiency 03/03/2016   Cerebrovascular accident (CVA) due to thrombosis of precerebral artery ([place]) 03/01/2016   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 ([place]) 03/01/2016   Atherosclerosis of native arteries of the extremities with gangrene ([place]) 12/08/2015   Anemia, chronic disease 04/27/2014   Unspecified hereditary and idiopathic peripheral neuropathy 03/14/2014   DM2 (diabetes mellitus, type 2) ([place]) 02/14/2014   Aortic stenosis 02/04/2014   Closed left subtrochanteric femur fracture ([place]) 02/03/2014   Hip fracture, left ([place]) 02/03/2014   Fibula fracture 02/03/2014   MTP instability 02/03/2014   Hip fracture ([place]) 02/03/2014    Lewis Moccasin, PT 09/13/2021, 3:35 PM  [place] MAIN [facility] [place], [place], [address] Phone: [phone]   Fax:  [phone]  Name: DILIA ALEMANY MRN: [id number] Date of Birth: 1940/11/22

## 2021-09-17 ENCOUNTER — Other Ambulatory Visit: Payer: Self-pay

## 2021-09-17 ENCOUNTER — Ambulatory Visit: Payer: Medicare HMO | Admitting: Physical Therapy

## 2021-09-17 DIAGNOSIS — M6281 Muscle weakness (generalized): Secondary | ICD-10-CM

## 2021-09-17 DIAGNOSIS — R278 Other lack of coordination: Secondary | ICD-10-CM

## 2021-09-17 DIAGNOSIS — R293 Abnormal posture: Secondary | ICD-10-CM

## 2021-09-17 DIAGNOSIS — R269 Unspecified abnormalities of gait and mobility: Secondary | ICD-10-CM

## 2021-09-17 DIAGNOSIS — R2689 Other abnormalities of gait and mobility: Secondary | ICD-10-CM

## 2021-09-17 DIAGNOSIS — R262 Difficulty in walking, not elsewhere classified: Secondary | ICD-10-CM

## 2021-09-17 DIAGNOSIS — R2681 Unsteadiness on feet: Secondary | ICD-10-CM

## 2021-09-17 NOTE — Therapy (Signed)
[place] MAIN [facility] SERVICES [address] [place], [place], [address] Phone: [phone]   Fax:  [phone]  Physical Therapy Treatment  Patient Details  Name: [name] MRN: [id number] Date of Birth: 1940/09/02 Referring Provider (PT): Sharin Grave   Encounter Date: 09/17/2021   PT End of Session - 09/17/21 1445     Visit Number 26    Number of Visits 82    Date for PT Re-Evaluation 11/14/21    Authorization Type [employer]; Medicaid    Authorization Time Period 1/18-3/31, 24 visits    Authorization - Visit Number 1    Authorization - Number of Visits 24    PT Start Time 1431    PT Stop Time 1505    PT Time Calculation (min) 34 min    Equipment Utilized During Treatment Gait belt    Activity Tolerance Patient limited by fatigue;Patient limited by pain    Behavior During Therapy Flat affect             Past Medical History:  Diagnosis Date   Acute heart failure ([place])    Aortic stenosis    CHF (congestive heart failure) ([place])    Complication of anesthesia    Hard to wake patient up after having anesthesia   Diabetes mellitus without complication ([place])    Diabetic neuropathy ([place])    Takes Lyrica   Edema of both legs    Takes Lasix   GERD (gastroesophageal reflux disease)    High cholesterol    HTN (hypertension)    Hypertension    PAD (peripheral artery disease) ([place])    Shortness of breath dyspnea    Spasm of back muscles    Stroke ([place])    Wound, open    Right great toe    Past Surgical History:  Procedure Laterality Date   AMPUTATION TOE Left 06/09/2019   Procedure: Left third toe and partial great toe amputation and debridement;  Surgeon: Katha Cabal, MD;  Location: [facility];  Service: Vascular;  Laterality: Left;   AMPUTATION TOE Left 04/06/2020   Procedure: AMPUTATION LEFT SECOND TOE;  Surgeon: Samara Deist, DPM;  Location: [facility];  Service: Podiatry;  Laterality: Left;   BYPASS GRAFT  POPLITEAL TO TIBIAL Right 02/28/2016   Procedure: BYPASS GRAFT RIGHT BELOW KNEE POPLITEAL TO PERONEAL USING REVERSED RIGHT GREATER SAPHENOUS VEIN;  Surgeon: Conrad Canalou, MD;  Location: [place];  Service: Vascular;  Laterality: Right;   CYST EXCISION     Abdomen   EYE SURGERY Bilateral    Cataract removal   INTRAMEDULLARY (IM) NAIL INTERTROCHANTERIC Left 02/04/2014   Procedure: INTRAMEDULLARY (IM) NAIL INTERTROCHANTRIC FEMORAL;  Surgeon: Mauri Pole, MD;  Location: [place];  Service: Orthopedics;  Laterality: Left;   IR GENERIC HISTORICAL  03/01/2016   IR ANGIO INTRA EXTRACRAN SEL COM CAROTID INNOMINATE UNI R MOD SED 03/01/2016 [name], MD [facility]   IR GENERIC HISTORICAL  03/01/2016   IR ENDOVASC INTRACRANIAL INF OTHER THAN THROMBO ART INC DIAG ANGIO 03/01/2016 [name], MD [facility]   IR GENERIC HISTORICAL  03/01/2016   IR INTRAVSC STENT CERV CAROTID W/O EMB-PROT MOD SED INC ANGIO 03/01/2016 [name], MD [facility]   IR GENERIC HISTORICAL  06/03/2016   IR RADIOLOGIST EVAL & MGMT 06/03/2016 [facility]   LOWER EXTREMITY ANGIOGRAPHY Left 02/09/2019   Procedure: LOWER EXTREMITY ANGIOGRAPHY;  Surgeon: Katha Cabal, MD;  Location: [place] [facility];  Service:  Cardiovascular;  Laterality: Left;   LOWER EXTREMITY ANGIOGRAPHY Left 03/10/2019   Procedure: LOWER EXTREMITY ANGIOGRAPHY;  Surgeon: Katha Cabal, MD;  Location: [place] [facility];  Service: Cardiovascular;  Laterality: Left;   LOWER EXTREMITY ANGIOGRAPHY Left 04/27/2019   Procedure: LOWER EXTREMITY ANGIOGRAPHY;  Surgeon: Katha Cabal, MD;  Location: [place] [facility];  Service: Cardiovascular;  Laterality: Left;   LOWER EXTREMITY ANGIOGRAPHY Left 06/08/2019   Procedure: Lower Extremity Angiography;  Surgeon: Katha Cabal, MD;  Location: [place] [facility];  Service: Cardiovascular;  Laterality: Left;   LOWER EXTREMITY ANGIOGRAPHY Left 04/03/2020   Procedure: Lower Extremity  Angiography;  Surgeon: Algernon Huxley, MD;  Location: [place] [facility];  Service: Cardiovascular;  Laterality: Left;   LOWER EXTREMITY ANGIOGRAPHY Right 10/20/2020   Procedure: Lower Extremity Angiography;  Surgeon: Katha Cabal, MD;  Location: [place] [facility];  Service: Cardiovascular;  Laterality: Right;   LOWER EXTREMITY ANGIOGRAPHY Left 01/23/2021   Procedure: LOWER EXTREMITY ANGIOGRAPHY;  Surgeon: Katha Cabal, MD;  Location: [place] [facility];  Service: Cardiovascular;  Laterality: Left;   LOWER EXTREMITY ANGIOGRAPHY Right 04/10/2021   Procedure: LOWER EXTREMITY ANGIOGRAPHY;  Surgeon: Katha Cabal, MD;  Location: [place] [facility];  Service: Cardiovascular;  Laterality: Right;   ORIF TOE FRACTURE Right 02/08/2014   Procedure: OPEN REDUCTION INTERNAL FIXATION Right METATARSAL  FRACTURE ;  Surgeon: Wylene Simmer, MD;  Location: [place];  Service: Orthopedics;  Laterality: Right;   PERIPHERAL VASCULAR CATHETERIZATION N/A 12/28/2015   Procedure: Abdominal Aortogram w/Lower Extremity;  Surgeon: Conrad [place], MD;  Location: [place] [facility];  Service: Cardiovascular;  Laterality: N/A;   RADIOLOGY WITH ANESTHESIA N/A 03/01/2016   Procedure: RADIOLOGY WITH ANESTHESIA;  Surgeon: [name], MD;  Location: [place];  Service: Radiology;  Laterality: N/A;   VEIN HARVEST Right 02/28/2016   Procedure: RIGHT GREATER SAPHENOUS VEIN HARVEST;  Surgeon: Conrad [place], MD;  Location: [place];  Service: Vascular;  Laterality: Right;    There were no vitals filed for this visit.   Subjective Assessment - 09/17/21 1437     Subjective Patient arrives with husband. Husband reports pt began experiencing R shoulder pain last night; not associated with any exercise/movement. Pt points to superior joint. No falls, LOB since last session. Husband is attempting to complete HEP with pt, she is partially compliant.    Patient is accompained by: Family member    Pertinent History 60yoF presenting to  [facility] to address recent gradual progressive decline in mobility, family goals for improved strength, to promote > participation in transfers to wheelchair and safety with ADL performance. Recent illness includes admission to [facility] on 03/07/21-03/10/21 due to COVID infection c AMS and fever. Pt then underwent outpatient recanalization/limb salvage of RLE 04/10/2021; LLE salvage performed in June 2022. Other PMH: CHF, DM, HTN, CVA, COPD, multiple remote toe amputations, stg 2 sacral ulcer. PLOF includes 3-4 years bed bound status, hoyerlifts to WC, totalA for ADL performance. Pt is cared for by family, as well as professional caregiver services.    Limitations Sitting;Walking;Lifting;Standing;House hold activities    How long can you sit comfortably? Pt spouse reports 2 hours. Must be careful d/t preventing and trying to heal wounds on bottom.    How long can you stand comfortably? unable    How long can you walk comfortably? Unable    Diagnostic tests 6/21 and 9/06 peripheral vascular catheterizatoin; 04/10/2021 Successful recanalization right lower extremity for  limb salvage    Patient Stated Goals to strengthen pt's trunk to improve mobility in chair, assist with ADLs    Currently in Pain? Yes    Pain Score 2     Pain Location Shoulder    Pain Orientation Right    Pain Descriptors / Indicators Aching;Sore    Pain Type Chronic pain    Pain Onset In the past 7 days               INTERVENTIONS:    Therex in wheelchair:   AROM- knee ext B 2 sets of 10 reps -  AAROM-Hip flex 2 sets of 10 reps each LE AAROM- Hip abd  x 10 reps Hip add- Ball squeezes with slow count 2x10 reps  Ball toss - 2 sets of 10 reps (focusing on hand/eye coordination) - Patient able to catch ball 10/10 times and toss back with fair accuracy; VC to throw up rather than down to the floor.    *Pt with right shoulder pain this date. Did not perform UE exercises due to pt repeatedly reporting shoulder pain.*     Performed  sit<>Stand with max A +2 using standard walker with cues for hand placement and positioning. Pt did not tolerate well, unable to shift weight forward over BOS. Pt not wearing shoes today - predict that was a limiting factor to her stand. More of a dependent transfer yet patient did not yell today or complain of any pain.     Clinical Impression: Pt arrives to session with her supportive husband. He states he forgot her shoes today. POC was continued with LE strengthening. UE exercises were not performed due to report of UE pain beginning last night. Standing was attempted; poor ability to shift weight over BOS leading to strong posterior lean and PT ending the stand. Pt reports pain in feet. Will continue to progress total body strengthening and standing stability/tolerance in following sessions. She would benefit from additional skilled PT Intervention to improve strength, balance and mobility.             PT Short Term Goals - 08/22/21 1308       PT SHORT TERM GOAL #1   Title Patient will be independent in home exercise program to improve strength/mobility for better functional independence with ADLs.    Baseline 10/6: to be initiated, 11/8: caregiver reports she is doing them 3x a day; 1/18: doesn't do much exercise    Time 4    Period Weeks    Status Partially Met    Target Date 09/19/21               PT Long Term Goals - 08/22/21 1309       PT LONG TERM GOAL #1   Title Patient will increase FOTO score to equal to or greater than 44 to demonstrate statistically significant improvement in mobility and quality of life.    Baseline 10/6: 26, 11/8: 12%, 1/18: 12%    Time 12    Period Weeks    Status Not Met    Target Date 11/14/21      PT LONG TERM GOAL #2   Title Patient will roll L and R in bed with mod A for decreased caregiver burden for ADLs and toileting.    Baseline 10/6: per pt spouse, pt currently dependent, 11/8: mod A, inconsistent, 1/18: needs min A    Time  12    Period Weeks    Status Partially Met  Target Date 11/14/21      PT LONG TERM GOAL #3   Title Patient will sit upright in WC, with BUE support and without leaning on backrest for 10 minutes for ADL performance and pressure relief.    Baseline 10/6: pt exhibits core contraction but unable to sit upright, 11/8: able to sit upright for <20 sec with mod VCs for erect positioning, 1/18: able to hold for 60 sec with mod VCs    Time 12    Period Weeks    Status Partially Met    Target Date 11/14/21      PT LONG TERM GOAL #4   Title Pt will demonstrate ability to utilize BUEs to weight-shift to each side in WC and maintain weight shift for 30 seconds to assist in pressure relief and reduce risk of wound formation.    Baseline 10/6: pt currently unable, 11/8: unable to initiate weight shift, able to use UE but unable to shift hips without assistance; 1/18: requires max cues and min A for weight shift    Time 12    Period Weeks    Status Partially Met    Target Date 11/14/21                   Plan - 09/17/21 1513     Clinical Impression Statement Pt arrives to session with her supportive husband. He states he forgot her shoes today. POC was continued with LE strengthening. UE exercises were not performed due to report of UE pain beginning last night. Standing was attempted; poor ability to shift weight over BOS leading to strong posterior lean and PT ending the stand. Pt reports pain in feet. Will continue to progress total body strengthening and standing stability/tolerance in following sessions. She would benefit from additional skilled PT Intervention to improve strength, balance and mobility.    Personal Factors and Comorbidities Age;Time since onset of injury/illness/exacerbation;Comorbidity 3+;Past/Current Experience;Education;Social Background;Transportation;Sex;Fitness    Comorbidities diastolic congestive heart failure, diabetes mellitus, diabetic neuropathy, hypertension,  hyperlipidemia, GERD, peripheral vascular disease, CVA. on 9/2 had amputation of L foot second toe MTP joint and LLE revascularization, recent hx of COVID19    Examination-Activity Limitations Bathing;Hygiene/Grooming;Squat;Bed Mobility;Stairs;Lift;Bend;Locomotion Level;Stand;Caring for [employer];Toileting;Carry;Self Feeding;Transfers;Continence;Dressing;Other;Sit    Examination-Participation Restrictions Cleaning;Shop;Community Activity;Meal Prep;Driving;Other    Stability/Clinical Decision Making Evolving/Moderate complexity    Rehab Potential Poor    PT Frequency 2x / week    PT Duration 12 weeks    PT Treatment/Interventions ADLs/Self Care Home Management;DME Instruction;Functional mobility training;Therapeutic activities;Therapeutic exercise;Neuromuscular re-education;Patient/family education;Wheelchair mobility training;Passive range of motion;Energy conservation;Cryotherapy;Biofeedback;Electrical Stimulation;Iontophoresis 46m/ml Dexamethasone;Moist Heat;Traction;Ultrasound;Stair training;Cognitive remediation;Balance training;Orthotic Fit/Training;Manual techniques;Manual lymph drainage;Splinting;Taping;Vasopneumatic Device;Vestibular;Visual/perceptual remediation/compensation;Gait training    PT Next Visit Plan bed mobility, pressure relief techniques; Progressive Therex for strengthening    PT Home Exercise Plan Previous- Seated lateral weight shift pressure relief technique in wheelchair to be performed 5 days/week for 2 sets of 10 repetitions each side.  PT provided patient with picture of technique as well.  05/21/2021- Access Code: [email or web address]   Consulted and Agree with Plan of Care Family member/caregiver    Family Member Consulted Spouse             Patient will benefit from skilled therapeutic intervention in order to improve the following deficits and impairments:  Decreased cognition, Decreased skin integrity, Impaired sensation, Decreased mobility, Postural  dysfunction, Decreased activity tolerance, Decreased endurance, Decreased range of motion, Decreased strength, Decreased balance, Decreased safety awareness, Increased edema, Impaired flexibility, Decreased coordination, Cardiopulmonary status  limiting activity, Abnormal gait, Impaired UE functional use, Impaired tone, Improper body mechanics, Pain, Difficulty walking, Decreased knowledge of use of DME, Hypomobility, Impaired perceived functional ability  Visit Diagnosis: Muscle weakness (generalized)  Difficulty in walking, not elsewhere classified  Unsteadiness on feet  Other abnormalities of gait and mobility  Other lack of coordination  Abnormality of gait and mobility  Abnormal posture     Problem List Patient Active Problem List   Diagnosis Date Noted   Atherosclerosis of native arteries of extremity with rest pain ([place]) 04/20/2021   COVID-19 virus infection 03/07/2021   Acute febrile illness 03/07/2021   Hypoalbuminemia 03/07/2021   Hyperglycemia due to diabetes mellitus ([place]) 03/07/2021   Elevated troponin 03/07/2021   Constipation 03/07/2021   COPD (chronic obstructive pulmonary disease) ([place]) 10/20/2020   GERD (gastroesophageal reflux disease) 10/20/2020   Physical deconditioning 10/20/2020   Cellulitis 10/19/2020   Necrotic toes ([facility])    Insulin dependent diabetes mellitus type IA ([place])    Diabetic neuropathy ([place])    Gangrene of toe of both feet ([place]) 06/06/2019   Peripheral arterial disease ([place]) 04/27/2019   ESBL (extended spectrum beta-lactamase) producing bacteria infection 12/25/2018   PVD (peripheral vascular disease) ([place]) 09/12/2016   Ulcer of right lower extremity ([place]) 08/29/2016   Acute on chronic diastolic heart failure ([place]) 07/08/2016   HTN (hypertension) 07/05/2016   Sensorineural hearing loss (SNHL), 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   Chronic diastolic CHF (congestive heart failure) ([place]) 03/10/2016   HLD (hyperlipidemia) 03/09/2016   Acute respiratory  acidosis ([place]) 03/05/2016   Anemia, iron deficiency 03/03/2016   Cerebrovascular accident (CVA) due to thrombosis of precerebral artery ([place]) 03/01/2016   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 ([place]) 03/01/2016   Atherosclerosis of native arteries of the extremities with gangrene ([place]) 12/08/2015   Anemia, chronic disease 04/27/2014   Unspecified hereditary and idiopathic peripheral neuropathy 03/14/2014   DM2 (diabetes mellitus, type 2) ([place]) 02/14/2014   Aortic stenosis 02/04/2014   Closed left subtrochanteric femur fracture ([place]) 02/03/2014   Hip fracture, left ([place]) 02/03/2014   Fibula fracture 02/03/2014   MTP instability 02/03/2014   Hip fracture ([place]) 02/03/2014    Patrina Levering PT, DPT 09/17/21 3:14 PM [place] MAIN [facility] SERVICES [address] [place], [place], [address] Phone: [phone]   Fax:  [phone]  Name: [name] MRN: [id number] Date of Birth: March 05, 1941

## 2021-09-19 ENCOUNTER — Ambulatory Visit: Payer: Medicare HMO | Admitting: Physical Therapy

## 2021-09-19 ENCOUNTER — Other Ambulatory Visit: Payer: Self-pay

## 2021-09-19 ENCOUNTER — Encounter: Payer: Self-pay | Admitting: Physical Therapy

## 2021-09-19 DIAGNOSIS — R2689 Other abnormalities of gait and mobility: Secondary | ICD-10-CM

## 2021-09-19 DIAGNOSIS — R278 Other lack of coordination: Secondary | ICD-10-CM

## 2021-09-19 DIAGNOSIS — R2681 Unsteadiness on feet: Secondary | ICD-10-CM

## 2021-09-19 DIAGNOSIS — R262 Difficulty in walking, not elsewhere classified: Secondary | ICD-10-CM

## 2021-09-19 DIAGNOSIS — M6281 Muscle weakness (generalized): Secondary | ICD-10-CM

## 2021-09-20 NOTE — Therapy (Signed)
[place] MAIN [facility] SERVICES [address] [place], [place], [address] Phone: [phone]   Fax:  [phone]  Physical Therapy Treatment  Patient Details  Name: [name] MRN: [id number] Date of Birth: [birth year] Referring Provider (PT): Sharin Grave   Encounter Date: 09/19/2021   PT End of Session - 09/19/21 1257     Visit Number 27    Number of Visits 67    Date for PT Re-Evaluation 11/14/21    Authorization Type [employer]; Medicaid    Authorization Time Period 1/18-3/31, 24 visits    Authorization - Visit Number 1    Authorization - Number of Visits 24    PT Start Time 1148    PT Stop Time 1230    PT Time Calculation (min) 42 min    Activity Tolerance Patient limited by fatigue;Patient limited by pain    Behavior During Therapy Flat affect             Past Medical History:  Diagnosis Date   Acute heart failure ([place])    Aortic stenosis    CHF (congestive heart failure) ([place])    Complication of anesthesia    Hard to wake patient up after having anesthesia   Diabetes mellitus without complication ([place])    Diabetic neuropathy ([place])    Takes Lyrica   Edema of both legs    Takes Lasix   GERD (gastroesophageal reflux disease)    High cholesterol    HTN (hypertension)    Hypertension    PAD (peripheral artery disease) ([place])    Shortness of breath dyspnea    Spasm of back muscles    Stroke ([place])    Wound, open    Right great toe    Past Surgical History:  Procedure Laterality Date   AMPUTATION TOE Left 06/09/2019   Procedure: Left third toe and partial great toe amputation and debridement;  Surgeon: Katha Cabal, MD;  Location: [facility];  Service: Vascular;  Laterality: Left;   AMPUTATION TOE Left 04/06/2020   Procedure: AMPUTATION LEFT SECOND TOE;  Surgeon: Samara Deist, DPM;  Location: [facility];  Service: Podiatry;  Laterality: Left;   BYPASS GRAFT POPLITEAL TO TIBIAL Right 02/28/2016   Procedure:  BYPASS GRAFT RIGHT BELOW KNEE POPLITEAL TO PERONEAL USING REVERSED RIGHT GREATER SAPHENOUS VEIN;  Surgeon: Conrad Leslie, MD;  Location: [place];  Service: Vascular;  Laterality: Right;   CYST EXCISION     Abdomen   EYE SURGERY Bilateral    Cataract removal   INTRAMEDULLARY (IM) NAIL INTERTROCHANTERIC Left 02/04/2014   Procedure: INTRAMEDULLARY (IM) NAIL INTERTROCHANTRIC FEMORAL;  Surgeon: Mauri Pole, MD;  Location: [place];  Service: Orthopedics;  Laterality: Left;   IR GENERIC HISTORICAL  03/01/2016   IR ANGIO INTRA EXTRACRAN SEL COM CAROTID INNOMINATE UNI R MOD SED 03/01/2016 [name], MD [facility]   IR GENERIC HISTORICAL  03/01/2016   IR ENDOVASC INTRACRANIAL INF OTHER THAN THROMBO ART INC DIAG ANGIO 03/01/2016 [name], MD [facility]   IR GENERIC HISTORICAL  03/01/2016   IR INTRAVSC STENT CERV CAROTID W/O EMB-PROT MOD SED INC ANGIO 03/01/2016 [name], MD [facility]   IR GENERIC HISTORICAL  06/03/2016   IR RADIOLOGIST EVAL & MGMT 06/03/2016 [facility]   LOWER EXTREMITY ANGIOGRAPHY Left 02/09/2019   Procedure: LOWER EXTREMITY ANGIOGRAPHY;  Surgeon: Katha Cabal, MD;  Location: [place] [facility];  Service: Cardiovascular;  Laterality: Left;   LOWER EXTREMITY ANGIOGRAPHY  Left 03/10/2019   Procedure: LOWER EXTREMITY ANGIOGRAPHY;  Surgeon: Katha Cabal, MD;  Location: [place] [facility];  Service: Cardiovascular;  Laterality: Left;   LOWER EXTREMITY ANGIOGRAPHY Left 04/27/2019   Procedure: LOWER EXTREMITY ANGIOGRAPHY;  Surgeon: Katha Cabal, MD;  Location: [place] [facility];  Service: Cardiovascular;  Laterality: Left;   LOWER EXTREMITY ANGIOGRAPHY Left 06/08/2019   Procedure: Lower Extremity Angiography;  Surgeon: Katha Cabal, MD;  Location: [place] [facility];  Service: Cardiovascular;  Laterality: Left;   LOWER EXTREMITY ANGIOGRAPHY Left 04/03/2020   Procedure: Lower Extremity Angiography;  Surgeon: Algernon Huxley, MD;  Location:  [place] [facility];  Service: Cardiovascular;  Laterality: Left;   LOWER EXTREMITY ANGIOGRAPHY Right 10/20/2020   Procedure: Lower Extremity Angiography;  Surgeon: Katha Cabal, MD;  Location: [place] [facility];  Service: Cardiovascular;  Laterality: Right;   LOWER EXTREMITY ANGIOGRAPHY Left 01/23/2021   Procedure: LOWER EXTREMITY ANGIOGRAPHY;  Surgeon: Katha Cabal, MD;  Location: [place] [facility];  Service: Cardiovascular;  Laterality: Left;   LOWER EXTREMITY ANGIOGRAPHY Right 04/10/2021   Procedure: LOWER EXTREMITY ANGIOGRAPHY;  Surgeon: Katha Cabal, MD;  Location: [place] [facility];  Service: Cardiovascular;  Laterality: Right;   ORIF TOE FRACTURE Right 02/08/2014   Procedure: OPEN REDUCTION INTERNAL FIXATION Right METATARSAL  FRACTURE ;  Surgeon: Wylene Simmer, MD;  Location: [place];  Service: Orthopedics;  Laterality: Right;   PERIPHERAL VASCULAR CATHETERIZATION N/A 12/28/2015   Procedure: Abdominal Aortogram w/Lower Extremity;  Surgeon: Conrad El Campo, MD;  Location: [place] [facility];  Service: Cardiovascular;  Laterality: N/A;   RADIOLOGY WITH ANESTHESIA N/A 03/01/2016   Procedure: RADIOLOGY WITH ANESTHESIA;  Surgeon: [name], MD;  Location: [place];  Service: Radiology;  Laterality: N/A;   VEIN HARVEST Right 02/28/2016   Procedure: RIGHT GREATER SAPHENOUS VEIN HARVEST;  Surgeon: Conrad Fordoche, MD;  Location: [place];  Service: Vascular;  Laterality: Right;    There were no vitals filed for this visit.   Subjective Assessment - 09/19/21 1255     Subjective Patient arrives with husband. husband reports they have been working on exercise in bed and they have been going well. He reports patient was able to reach with UE and initiate rolling in bed;    Patient is accompained by: Family member    Pertinent History 59yoF presenting to [place] to address recent gradual progressive decline in mobility, family goals for improved strength, to promote > participation in  transfers to wheelchair and safety with ADL performance. Recent illness includes admission to [facility] on 03/07/21-03/10/21 due to COVID infection c AMS and fever. Pt then underwent outpatient recanalization/limb salvage of RLE 04/10/2021; LLE salvage performed in June 2022. Other PMH: CHF, DM, HTN, CVA, COPD, multiple remote toe amputations, stg 2 sacral ulcer. PLOF includes 3-4 years bed bound status, hoyerlifts to WC, totalA for ADL performance. Pt is cared for by family, as well as professional caregiver services.    Limitations Sitting;Walking;Lifting;Standing;House hold activities    How long can you sit comfortably? Pt spouse reports 2 hours. Must be careful d/t preventing and trying to heal wounds on bottom.    How long can you stand comfortably? unable    How long can you walk comfortably? Unable    Diagnostic tests 6/21 and 9/06 peripheral vascular catheterizatoin; 04/10/2021 Successful recanalization right lower extremity for limb salvage    Patient Stated Goals to strengthen pt's trunk to improve mobility in chair,  assist with ADLs    Currently in Pain? Yes    Pain Score --   unable to rate   Pain Location Shoulder    Pain Orientation Right    Pain Descriptors / Indicators Aching;Sore    Pain Type Acute pain    Pain Onset In the past 7 days    Pain Frequency Intermittent    Aggravating Factors  reaching/tender to touch    Pain Relieving Factors rest    Effect of Pain on Daily Activities decreased activity tolerance;    Multiple Pain Sites No                TREATMENT:   TA: Patient transferred wheelchair to mat table with hoyer lift requiring +2 for proper positioning for comfort;    At end of session Patient Transferred back to wheelchair using hoyer lift requiring +2 for positioning;      Ex: Instructed patient/caregiver in exercise to facilitate better ROM/strengthening:  Hamstring curl, AAROM x10 reps each LE, patient reports increased knee pain with end range of motion  having difficulty achieving knee flexion;  Ankle DF/PF PROM with overpressure x10 reps, progressed to AAROM x5 reps each LE; Patient reports increased discomfort with DF overpressure. She was able to activate ankle PF but has difficulty initiating ankle DF;   Attempted trunk rotation with legs bent, however patient unable to tolerate knee flexion due to contractures;  Caregiver educated in ways to increase adherence with exercise with allowing patient breaks to reduce discomfort;  TA: Patient instructed in sitting edge of mat table, required +2 to roll to sidelying and then transition to sitting on edge of mat, PT sitting behind patient to provide back support, +2 person in front to faciltiate trunk positioning/lean; Patient instructed in anterior weight shift to facilitate better postural support, patient able to sit with CGA but often falls backward with poor trunk control. She denies any increase in pain;  She was transferred mat table to wheelchair with +2 using hoyer lift; Patient required assistance for repositioning in chair for comfort;    Patient tolerated session fair. She was able to tolerate increased exercise/ROM but continues to be limited with decreased knee flexion. Reinforced HEP   Patient reports being comfortable once repositioned in chair;                             PT Education - 09/19/21 1256     Education Details ROM/positioning;    Person(s) Educated Patient    Methods Explanation;Verbal cues    Comprehension Verbalized understanding;Returned demonstration;Verbal cues required;Need further instruction              PT Short Term Goals - 08/22/21 1308       PT SHORT TERM GOAL #1   Title Patient will be independent in home exercise program to improve strength/mobility for better functional independence with ADLs.    Baseline 10/6: to be initiated, 11/8: caregiver reports she is doing them 3x a day; 1/18: doesn't do much exercise    Time 4     Period Weeks    Status Partially Met    Target Date 09/19/21               PT Long Term Goals - 08/22/21 1309       PT LONG TERM GOAL #1   Title Patient will increase FOTO score to equal to or greater than 44 to demonstrate statistically significant  improvement in mobility and quality of life.    Baseline 10/6: 26, 11/8: 12%, 1/18: 12%    Time 12    Period Weeks    Status Not Met    Target Date 11/14/21      PT LONG TERM GOAL #2   Title Patient will roll L and R in bed with mod A for decreased caregiver burden for ADLs and toileting.    Baseline 10/6: per pt spouse, pt currently dependent, 11/8: mod A, inconsistent, 1/18: needs min A    Time 12    Period Weeks    Status Partially Met    Target Date 11/14/21      PT LONG TERM GOAL #3   Title Patient will sit upright in WC, with BUE support and without leaning on backrest for 10 minutes for ADL performance and pressure relief.    Baseline 10/6: pt exhibits core contraction but unable to sit upright, 11/8: able to sit upright for <20 sec with mod VCs for erect positioning, 1/18: able to hold for 60 sec with mod VCs    Time 12    Period Weeks    Status Partially Met    Target Date 11/14/21      PT LONG TERM GOAL #4   Title Pt will demonstrate ability to utilize BUEs to weight-shift to each side in WC and maintain weight shift for 30 seconds to assist in pressure relief and reduce risk of wound formation.    Baseline 10/6: pt currently unable, 11/8: unable to initiate weight shift, able to use UE but unable to shift hips without assistance; 1/18: requires max cues and min A for weight shift    Time 12    Period Weeks    Status Partially Met    Target Date 11/14/21                   Plan - 09/19/21 1257     Clinical Impression Statement Patient motivated and participated well within session. She does seem to tolerate and exhibit better ROM this session compared to previous sessions likely from compliance with  HEP. She is limited in knee flexion and ankle DF. PT educated patient/caregiver in ways to increase tolerance with ROM including to allow breaks. Patient was transitioned from supine to sitting edge of mat table. She required +2 for positioning and cues for erect posture. Patient often falls backwards due to weakness and poor trunk control. She was able to initiate forward lean but unable to hold position. Patient denies any significant increase in back pain. Was able to tolerate sitting edge of mat table for approximately 5 min; Patient would benefit from additional skilled PT Intervention to improve strength and trunk control for better positioning and ADL ability; Reinforced HEP;    Personal Factors and Comorbidities Age;Time since onset of injury/illness/exacerbation;Comorbidity 3+;Past/Current Experience;Education;Social Background;Transportation;Sex;Fitness    Comorbidities diastolic congestive heart failure, diabetes mellitus, diabetic neuropathy, hypertension, hyperlipidemia, GERD, peripheral vascular disease, CVA. on 9/2 had amputation of L foot second toe MTP joint and LLE revascularization, recent hx of COVID19    Examination-Activity Limitations Bathing;Hygiene/Grooming;Squat;Bed Mobility;Stairs;Lift;Bend;Locomotion Level;Stand;Caring for [employer];Toileting;Carry;Self Feeding;Transfers;Continence;Dressing;Other;Sit    Examination-Participation Restrictions Cleaning;Shop;Community Activity;Meal Prep;Driving;Other    Stability/Clinical Decision Making Evolving/Moderate complexity    Rehab Potential Poor    PT Frequency 2x / week    PT Duration 12 weeks    PT Treatment/Interventions ADLs/Self Care Home Management;DME Instruction;Functional mobility training;Therapeutic activities;Therapeutic exercise;Neuromuscular re-education;Patient/family education;Wheelchair mobility training;Passive range of motion;Energy conservation;Cryotherapy;Biofeedback;Dealer  Stimulation;Iontophoresis  27m/ml Dexamethasone;Moist Heat;Traction;Ultrasound;Stair training;Cognitive remediation;Balance training;Orthotic Fit/Training;Manual techniques;Manual lymph drainage;Splinting;Taping;Vasopneumatic Device;Vestibular;Visual/perceptual remediation/compensation;Gait training    PT Next Visit Plan bed mobility, pressure relief techniques; Progressive Therex for strengthening    PT Home Exercise Plan Previous- Seated lateral weight shift pressure relief technique in wheelchair to be performed 5 days/week for 2 sets of 10 repetitions each side.  PT provided patient with picture of technique as well.  05/21/2021- Access Code: [email or web address]   Consulted and Agree with Plan of Care Family member/caregiver    Family Member Consulted Spouse             Patient will benefit from skilled therapeutic intervention in order to improve the following deficits and impairments:  Decreased cognition, Decreased skin integrity, Impaired sensation, Decreased mobility, Postural dysfunction, Decreased activity tolerance, Decreased endurance, Decreased range of motion, Decreased strength, Decreased balance, Decreased safety awareness, Increased edema, Impaired flexibility, Decreased coordination, Cardiopulmonary status limiting activity, Abnormal gait, Impaired UE functional use, Impaired tone, Improper body mechanics, Pain, Difficulty walking, Decreased knowledge of use of DME, Hypomobility, Impaired perceived functional ability  Visit Diagnosis: Muscle weakness (generalized)  Difficulty in walking, not elsewhere classified  Unsteadiness on feet  Other abnormalities of gait and mobility  Other lack of coordination     Problem List Patient Active Problem List   Diagnosis Date Noted   Atherosclerosis of native arteries of extremity with rest pain ([facility] 04/20/2021   COVID-19 virus infection 03/07/2021   Acute febrile illness 03/07/2021   Hypoalbuminemia 03/07/2021   Hyperglycemia due to diabetes mellitus ([place]  03/07/2021   Elevated troponin 03/07/2021   Constipation 03/07/2021   COPD (chronic obstructive pulmonary disease) ([place] 10/20/2020   GERD (gastroesophageal reflux disease) 10/20/2020   Physical deconditioning 10/20/2020   Cellulitis 10/19/2020   Necrotic toes (HCC)    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)    Diabetic neuropathy (HCC)    Gangrene of toe of both feet ([facility] 06/06/2019   Peripheral arterial disease ([facility] 04/27/2019   ESBL (extended spectrum beta-lactamase) producing bacteria infection 12/25/2018   PVD (peripheral vascular disease) ([place] 09/12/2016   Ulcer of right lower extremity ([place] 08/29/2016   Acute on chronic diastolic heart failure ([place] 07/08/2016   HTN (hypertension) 07/05/2016   Sensorineural hearing loss (SNHL), 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   Chronic diastolic CHF (congestive heart failure) ([facility] 03/10/2016   HLD (hyperlipidemia) 03/09/2016   Acute respiratory acidosis (HCC) 03/05/2016   Anemia, iron deficiency 03/03/2016   Cerebrovascular accident (CVA) due to thrombosis of precerebral artery ([facility] 03/01/2016   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 ([facility] 03/01/2016   Atherosclerosis of native arteries of the extremities with gangrene ([facility] 12/08/2015   Anemia, chronic disease 04/27/2014   Unspecified hereditary and idiopathic peripheral neuropathy 03/14/2014   DM2 (diabetes mellitus, type 2) ([facility] 02/14/2014   Aortic stenosis 02/04/2014   Closed left subtrochanteric femur fracture ([facility] 02/03/2014   Hip fracture, left ([place] 02/03/2014   Fibula fracture 02/03/2014   MTP instability 02/03/2014   Hip fracture ([facility] 02/03/2014    Keliyah Spillman, PT, DPT 09/20/2021, 1:05 PM  [place] [address]Phone: [phone]  Fax:  [phone] Name: [name] FRIEDLANDERMRN: [id number] of Birth: [birth year]

## 2021-09-24 ENCOUNTER — Other Ambulatory Visit: Payer: Self-pay

## 2021-09-24 ENCOUNTER — Ambulatory Visit: Payer: Medicare HMO

## 2021-09-24 DIAGNOSIS — R2689 Other abnormalities of gait and mobility: Secondary | ICD-10-CM | POA: Diagnosis not present

## 2021-09-24 DIAGNOSIS — R293 Abnormal posture: Secondary | ICD-10-CM

## 2021-09-24 DIAGNOSIS — M6281 Muscle weakness (generalized): Secondary | ICD-10-CM

## 2021-09-24 DIAGNOSIS — R2681 Unsteadiness on feet: Secondary | ICD-10-CM

## 2021-09-24 DIAGNOSIS — R262 Difficulty in walking, not elsewhere classified: Secondary | ICD-10-CM

## 2021-09-24 DIAGNOSIS — R269 Unspecified abnormalities of gait and mobility: Secondary | ICD-10-CM

## 2021-09-24 DIAGNOSIS — R278 Other lack of coordination: Secondary | ICD-10-CM

## 2021-09-24 NOTE — Therapy (Signed)
[place] MAIN [facility] SERVICES [address] [place], [place], [address] Phone: [phone]   Fax:  [phone]  Physical Therapy Treatment  Patient Details  Name: [name] MRN: [id number] Date of Birth: 27-Jun-1941 Referring Provider (PT): Sharin Grave   Encounter Date: 09/24/2021   PT End of Session - 09/24/21 1508     Visit Number 28    Number of Visits 33    Date for PT Re-Evaluation 11/14/21    Authorization Type [employer]; Medicaid    Authorization Time Period 1/18-3/31, 24 visits    Authorization - Visit Number 1    Authorization - Number of Visits 24    PT Start Time 1436    PT Stop Time 1510    PT Time Calculation (min) 34 min    Equipment Utilized During Treatment Gait belt    Activity Tolerance Patient limited by fatigue;Patient limited by pain    Behavior During Therapy Flat affect             Past Medical History:  Diagnosis Date   Acute heart failure ([place])    Aortic stenosis    CHF (congestive heart failure) ([place])    Complication of anesthesia    Hard to wake patient up after having anesthesia   Diabetes mellitus without complication ([place])    Diabetic neuropathy ([place])    Takes Lyrica   Edema of both legs    Takes Lasix   GERD (gastroesophageal reflux disease)    High cholesterol    HTN (hypertension)    Hypertension    PAD (peripheral artery disease) ([place])    Shortness of breath dyspnea    Spasm of back muscles    Stroke ([place])    Wound, open    Right great toe    Past Surgical History:  Procedure Laterality Date   AMPUTATION TOE Left 06/09/2019   Procedure: Left third toe and partial great toe amputation and debridement;  Surgeon: Katha Cabal, MD;  Location: [facility];  Service: Vascular;  Laterality: Left;   AMPUTATION TOE Left 04/06/2020   Procedure: AMPUTATION LEFT SECOND TOE;  Surgeon: Samara Deist, DPM;  Location: [facility];  Service: Podiatry;  Laterality: Left;   BYPASS GRAFT  POPLITEAL TO TIBIAL Right 02/28/2016   Procedure: BYPASS GRAFT RIGHT BELOW KNEE POPLITEAL TO PERONEAL USING REVERSED RIGHT GREATER SAPHENOUS VEIN;  Surgeon: Conrad [place], MD;  Location: [place];  Service: Vascular;  Laterality: Right;   CYST EXCISION     Abdomen   EYE SURGERY Bilateral    Cataract removal   INTRAMEDULLARY (IM) NAIL INTERTROCHANTERIC Left 02/04/2014   Procedure: INTRAMEDULLARY (IM) NAIL INTERTROCHANTRIC FEMORAL;  Surgeon: Mauri Pole, MD;  Location: [place];  Service: Orthopedics;  Laterality: Left;   IR GENERIC HISTORICAL  03/01/2016   IR ANGIO INTRA EXTRACRAN SEL COM CAROTID INNOMINATE UNI R MOD SED 03/01/2016 [name], MD [facility]   IR GENERIC HISTORICAL  03/01/2016   IR ENDOVASC INTRACRANIAL INF OTHER THAN THROMBO ART INC DIAG ANGIO 03/01/2016 [name], MD [facility]   IR GENERIC HISTORICAL  03/01/2016   IR INTRAVSC STENT CERV CAROTID W/O EMB-PROT MOD SED INC ANGIO 03/01/2016 [name], MD [facility]   IR GENERIC HISTORICAL  06/03/2016   IR RADIOLOGIST EVAL & MGMT 06/03/2016 [facility]   LOWER EXTREMITY ANGIOGRAPHY Left 02/09/2019   Procedure: LOWER EXTREMITY ANGIOGRAPHY;  Surgeon: Katha Cabal, MD;  Location: [place] [facility];  Service:  Cardiovascular;  Laterality: Left;   LOWER EXTREMITY ANGIOGRAPHY Left 03/10/2019   Procedure: LOWER EXTREMITY ANGIOGRAPHY;  Surgeon: Katha Cabal, MD;  Location: [place] [facility];  Service: Cardiovascular;  Laterality: Left;   LOWER EXTREMITY ANGIOGRAPHY Left 04/27/2019   Procedure: LOWER EXTREMITY ANGIOGRAPHY;  Surgeon: Katha Cabal, MD;  Location: [place] [facility];  Service: Cardiovascular;  Laterality: Left;   LOWER EXTREMITY ANGIOGRAPHY Left 06/08/2019   Procedure: Lower Extremity Angiography;  Surgeon: Katha Cabal, MD;  Location: [place] [facility];  Service: Cardiovascular;  Laterality: Left;   LOWER EXTREMITY ANGIOGRAPHY Left 04/03/2020   Procedure: Lower Extremity  Angiography;  Surgeon: Algernon Huxley, MD;  Location: [place] [facility];  Service: Cardiovascular;  Laterality: Left;   LOWER EXTREMITY ANGIOGRAPHY Right 10/20/2020   Procedure: Lower Extremity Angiography;  Surgeon: Katha Cabal, MD;  Location: [place] [facility];  Service: Cardiovascular;  Laterality: Right;   LOWER EXTREMITY ANGIOGRAPHY Left 01/23/2021   Procedure: LOWER EXTREMITY ANGIOGRAPHY;  Surgeon: Katha Cabal, MD;  Location: [place] [facility];  Service: Cardiovascular;  Laterality: Left;   LOWER EXTREMITY ANGIOGRAPHY Right 04/10/2021   Procedure: LOWER EXTREMITY ANGIOGRAPHY;  Surgeon: Katha Cabal, MD;  Location: [place] [facility];  Service: Cardiovascular;  Laterality: Right;   ORIF TOE FRACTURE Right 02/08/2014   Procedure: OPEN REDUCTION INTERNAL FIXATION Right METATARSAL  FRACTURE ;  Surgeon: Wylene Simmer, MD;  Location: [place];  Service: Orthopedics;  Laterality: Right;   PERIPHERAL VASCULAR CATHETERIZATION N/A 12/28/2015   Procedure: Abdominal Aortogram w/Lower Extremity;  Surgeon: Conrad [place], MD;  Location: [place] [facility];  Service: Cardiovascular;  Laterality: N/A;   RADIOLOGY WITH ANESTHESIA N/A 03/01/2016   Procedure: RADIOLOGY WITH ANESTHESIA;  Surgeon: [name], MD;  Location: [place];  Service: Radiology;  Laterality: N/A;   VEIN HARVEST Right 02/28/2016   Procedure: RIGHT GREATER SAPHENOUS VEIN HARVEST;  Surgeon: Conrad [place], MD;  Location: [place];  Service: Vascular;  Laterality: Right;    There were no vitals filed for this visit.   Subjective Assessment - 09/24/21 1440     Patient is accompained by: Family member    Pertinent History 9yoF presenting to [place] to address recent gradual progressive decline in mobility, family goals for improved strength, to promote > participation in transfers to wheelchair and safety with ADL performance. Recent illness includes admission to [facility] on 03/07/21-03/10/21 due to COVID infection c AMS and fever.  Pt then underwent outpatient recanalization/limb salvage of RLE 04/10/2021; LLE salvage performed in June 2022. Other PMH: CHF, DM, HTN, CVA, COPD, multiple remote toe amputations, stg 2 sacral ulcer. PLOF includes 3-4 years bed bound status, hoyerlifts to WC, totalA for ADL performance. Pt is cared for by family, as well as professional caregiver services.    Limitations Sitting;Walking;Lifting;Standing;House hold activities    How long can you sit comfortably? Pt spouse reports 2 hours. Must be careful d/t preventing and trying to heal wounds on bottom.    How long can you stand comfortably? unable    How long can you walk comfortably? Unable    Diagnostic tests 6/21 and 9/06 peripheral vascular catheterizatoin; 04/10/2021 Successful recanalization right lower extremity for limb salvage    Patient Stated Goals to strengthen pt's trunk to improve mobility in chair, assist with ADLs    Currently in Pain? Yes    Pain Score --   Patient reports buttock pain = unable to rate   Pain  Location Buttocks    Pain Descriptors / Indicators Sore    Pain Type Chronic pain    Pain Onset More than a month ago    Pain Frequency Intermittent    Aggravating Factors  prologned sitting    Pain Relieving Factors Rest with pressure off bottom    Effect of Pain on Daily Activities Decreased activity tolerance             INTERVENTIONS:    Therex:   Assisted hip march BLE x 10 reps x 2 sets Active knee ext= BLE x 10 reps x 2 sets Assisted hip abd/add BLE x 10 reps Assisted trunk flex - Leaning forward x 10 reps- Max verbal cues   Therapeutic activity:   Sit to stand in // bars x 4 (had 2 person available but patient able to perform with max assist of 1 person) - Max VC and physical assist to lean forward as patient slouched in chair.   Static standing x 4 trials today: MAX assist with all standing using gait belt and rails in // bars today.  1) 33 sec-Max VC to stand upright and ext hips- minimal  return demo 2) 38 sec- Patient reported "I'm scared." Increased encouragement to stand erect. 3) 44 sec 4) 18 sec- Patient fatigued - exaggerated forward lean with knees bend- leaning on PT for support  Education provided throughout session via VC/TC and demonstration to facilitate movement at target joints and correct muscle activation for all testing and exercises performed.                         PT Education - 09/24/21 1508     Education Details Transfer technique    Person(s) Educated Patient    Methods Explanation;Demonstration;Tactile cues;Verbal cues    Comprehension Verbalized understanding;Returned demonstration;Verbal cues required;Tactile cues required;Need further instruction              PT Short Term Goals - 08/22/21 1308       PT SHORT TERM GOAL #1   Title Patient will be independent in home exercise program to improve strength/mobility for better functional independence with ADLs.    Baseline 10/6: to be initiated, 11/8: caregiver reports she is doing them 3x a day; 1/18: doesn't do much exercise    Time 4    Period Weeks    Status Partially Met    Target Date 09/19/21               PT Long Term Goals - 08/22/21 1309       PT LONG TERM GOAL #1   Title Patient will increase FOTO score to equal to or greater than 44 to demonstrate statistically significant improvement in mobility and quality of life.    Baseline 10/6: 26, 11/8: 12%, 1/18: 12%    Time 12    Period Weeks    Status Not Met    Target Date 11/14/21      PT LONG TERM GOAL #2   Title Patient will roll L and R in bed with mod A for decreased caregiver burden for ADLs and toileting.    Baseline 10/6: per pt spouse, pt currently dependent, 11/8: mod A, inconsistent, 1/18: needs min A    Time 12    Period Weeks    Status Partially Met    Target Date 11/14/21      PT LONG TERM GOAL #3   Title Patient will sit upright in WC,  with BUE support and without leaning on  backrest for 10 minutes for ADL performance and pressure relief.    Baseline 10/6: pt exhibits core contraction but unable to sit upright, 11/8: able to sit upright for <20 sec with mod VCs for erect positioning, 1/18: able to hold for 60 sec with mod VCs    Time 12    Period Weeks    Status Partially Met    Target Date 11/14/21      PT LONG TERM GOAL #4   Title Pt will demonstrate ability to utilize BUEs to weight-shift to each side in WC and maintain weight shift for 30 seconds to assist in pressure relief and reduce risk of wound formation.    Baseline 10/6: pt currently unable, 11/8: unable to initiate weight shift, able to use UE but unable to shift hips without assistance; 1/18: requires max cues and min A for weight shift    Time 12    Period Weeks    Status Partially Met    Target Date 11/14/21                   Plan - 09/24/21 1509     Clinical Impression Statement Patient arrives today with fair motivation stating she doesn't remember what she did last time. She was agreeable to try standing. She was able to stand 4 trials today and performed fairly with max physical assist. She would benefit from additional skilled PT Intervention to improve strength, balance and mobility.    Personal Factors and Comorbidities Age;Time since onset of injury/illness/exacerbation;Comorbidity 3+;Past/Current Experience;Education;Social Background;Transportation;Sex;Fitness    Comorbidities diastolic congestive heart failure, diabetes mellitus, diabetic neuropathy, hypertension, hyperlipidemia, GERD, peripheral vascular disease, CVA. on 9/2 had amputation of L foot second toe MTP joint and LLE revascularization, recent hx of COVID19    Examination-Activity Limitations Bathing;Hygiene/Grooming;Squat;Bed Mobility;Stairs;Lift;Bend;Locomotion Level;Stand;Caring for [employer];Toileting;Carry;Self Feeding;Transfers;Continence;Dressing;Other;Sit    Examination-Participation Restrictions  Cleaning;Shop;Community Activity;Meal Prep;Driving;Other    Stability/Clinical Decision Making Evolving/Moderate complexity    Rehab Potential Poor    PT Frequency 2x / week    PT Duration 12 weeks    PT Treatment/Interventions ADLs/Self Care Home Management;DME Instruction;Functional mobility training;Therapeutic activities;Therapeutic exercise;Neuromuscular re-education;Patient/family education;Wheelchair mobility training;Passive range of motion;Energy conservation;Cryotherapy;Biofeedback;Electrical Stimulation;Iontophoresis 26m/ml Dexamethasone;Moist Heat;Traction;Ultrasound;Stair training;Cognitive remediation;Balance training;Orthotic Fit/Training;Manual techniques;Manual lymph drainage;Splinting;Taping;Vasopneumatic Device;Vestibular;Visual/perceptual remediation/compensation;Gait training    PT Next Visit Plan bed mobility, pressure relief techniques; Progressive Therex for strengthening; Transer training; pregait activities.    PT Home Exercise Plan Previous- Seated lateral weight shift pressure relief technique in wheelchair to be performed 5 days/week for 2 sets of 10 repetitions each side.  PT provided patient with picture of technique as well.  05/21/2021- Access Code: [email or web address]   Consulted and Agree with Plan of Care Family member/caregiver    Family Member Consulted Spouse             Patient will benefit from skilled therapeutic intervention in order to improve the following deficits and impairments:  Decreased cognition, Decreased skin integrity, Impaired sensation, Decreased mobility, Postural dysfunction, Decreased activity tolerance, Decreased endurance, Decreased range of motion, Decreased strength, Decreased balance, Decreased safety awareness, Increased edema, Impaired flexibility, Decreased coordination, Cardiopulmonary status limiting activity, Abnormal gait, Impaired UE functional use, Impaired tone, Improper body mechanics, Pain, Difficulty walking, Decreased knowledge of  use of DME, Hypomobility, Impaired perceived functional ability  Visit Diagnosis: Muscle weakness (generalized)  Difficulty in walking, not elsewhere classified  Unsteadiness on feet  Abnormality of gait and mobility  Other lack of  coordination  Abnormal posture     Problem List Patient Active Problem List   Diagnosis Date Noted   Atherosclerosis of native arteries of extremity with rest pain ([place]) 04/20/2021   COVID-19 virus infection 03/07/2021   Acute febrile illness 03/07/2021   Hypoalbuminemia 03/07/2021   Hyperglycemia due to diabetes mellitus ([place]) 03/07/2021   Elevated troponin 03/07/2021   Constipation 03/07/2021   COPD (chronic obstructive pulmonary disease) ([place]) 10/20/2020   GERD (gastroesophageal reflux disease) 10/20/2020   Physical deconditioning 10/20/2020   Cellulitis 10/19/2020   Necrotic toes (H[facility])    Insulin dependent diabetes mellitus type IA ([place])    Diabetic neuropathy ([place])    Gangrene of toe of both feet ([place]) 06/06/2019   Peripheral arterial disease ([place]) 04/27/2019   ESBL (extended spectrum beta-lactamase) producing bacteria infection 12/25/2018   PVD (peripheral vascular disease) ([place]) 09/12/2016   Ulcer of right lower extremity ([place]) 08/29/2016   Acute on chronic diastolic heart failure ([place]) 07/08/2016   HTN (hypertension) 07/05/2016   Sensorineural hearing loss (SNHL), 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   Chronic diastolic CHF (congestive heart failure) ([place]) 03/10/2016   HLD (hyperlipidemia) 03/09/2016   Acute respiratory acidosis ([place]) 03/05/2016   Anemia, iron deficiency 03/03/2016   Cerebrovascular accident (CVA) due to thrombosis of precerebral artery ([place]) 03/01/2016   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 ([place]) 03/01/2016   Atherosclerosis of native arteries of the extremities with gangrene ([place]) 12/08/2015   Anemia, chronic disease 04/27/2014   Unspecified hereditary and idiopathic peripheral neuropathy 03/14/2014    DM2 (diabetes mellitus, type 2) ([place]) 02/14/2014   Aortic stenosis 02/04/2014   Closed left subtrochanteric femur fracture ([place]) 02/03/2014   Hip fracture, left ([place]) 02/03/2014   Fibula fracture 02/03/2014   MTP instability 02/03/2014   Hip fracture ([place]) 02/03/2014    Lewis Moccasin, PT 09/24/2021, 3:27 PM  [place] MAIN [facility] SERVICES [address] [place], [place], [address] Phone: [phone]   Fax:  [phone]  Name: [name] MRN: [id number] Date of Birth: [birth year]

## 2021-09-26 ENCOUNTER — Other Ambulatory Visit: Payer: Self-pay

## 2021-09-26 ENCOUNTER — Encounter: Payer: Self-pay | Admitting: Physical Therapy

## 2021-09-26 ENCOUNTER — Ambulatory Visit: Payer: Medicare HMO | Admitting: Physical Therapy

## 2021-09-26 DIAGNOSIS — R2689 Other abnormalities of gait and mobility: Secondary | ICD-10-CM

## 2021-09-26 DIAGNOSIS — R2681 Unsteadiness on feet: Secondary | ICD-10-CM

## 2021-09-26 DIAGNOSIS — R269 Unspecified abnormalities of gait and mobility: Secondary | ICD-10-CM

## 2021-09-26 DIAGNOSIS — M6281 Muscle weakness (generalized): Secondary | ICD-10-CM

## 2021-09-26 DIAGNOSIS — R278 Other lack of coordination: Secondary | ICD-10-CM

## 2021-09-26 DIAGNOSIS — R262 Difficulty in walking, not elsewhere classified: Secondary | ICD-10-CM

## 2021-09-26 DIAGNOSIS — R293 Abnormal posture: Secondary | ICD-10-CM

## 2021-09-26 NOTE — Therapy (Signed)
[place] MAIN [facility] SERVICES [address] [place], [place], [address] Phone: [phone]   Fax:  [phone]  Physical Therapy Treatment  Patient Details  Name: [name] MRN: [id number] Date of Birth: Dec 19, 1940 Referring Provider (PT): Sharin Grave   Encounter Date: 09/26/2021   PT End of Session - 09/26/21 1154     Visit Number 29    Number of Visits 10    Date for PT Re-Evaluation 11/14/21    Authorization Type [employer]; Medicaid    Authorization Time Period 1/18-3/31, 24 visits    Authorization - Visit Number 1    Authorization - Number of Visits 24    PT Start Time 1147    PT Stop Time 1230    PT Time Calculation (min) 43 min    Equipment Utilized During Treatment Gait belt    Activity Tolerance Patient limited by fatigue;Patient limited by pain    Behavior During Therapy Flat affect             Past Medical History:  Diagnosis Date   Acute heart failure ([place])    Aortic stenosis    CHF (congestive heart failure) ([place])    Complication of anesthesia    Hard to wake patient up after having anesthesia   Diabetes mellitus without complication ([place])    Diabetic neuropathy ([place])    Takes Lyrica   Edema of both legs    Takes Lasix   GERD (gastroesophageal reflux disease)    High cholesterol    HTN (hypertension)    Hypertension    PAD (peripheral artery disease) ([place])    Shortness of breath dyspnea    Spasm of back muscles    Stroke ([place])    Wound, open    Right great toe    Past Surgical History:  Procedure Laterality Date   AMPUTATION TOE Left 06/09/2019   Procedure: Left third toe and partial great toe amputation and debridement;  Surgeon: Katha Cabal, MD;  Location: [facility];  Service: Vascular;  Laterality: Left;   AMPUTATION TOE Left 04/06/2020   Procedure: AMPUTATION LEFT SECOND TOE;  Surgeon: Samara Deist, DPM;  Location: [facility];  Service: Podiatry;  Laterality: Left;   BYPASS GRAFT  POPLITEAL TO TIBIAL Right 02/28/2016   Procedure: BYPASS GRAFT RIGHT BELOW KNEE POPLITEAL TO PERONEAL USING REVERSED RIGHT GREATER SAPHENOUS VEIN;  Surgeon: Conrad Shawnee, MD;  Location: [place];  Service: Vascular;  Laterality: Right;   CYST EXCISION     Abdomen   EYE SURGERY Bilateral    Cataract removal   INTRAMEDULLARY (IM) NAIL INTERTROCHANTERIC Left 02/04/2014   Procedure: INTRAMEDULLARY (IM) NAIL INTERTROCHANTRIC FEMORAL;  Surgeon: Mauri Pole, MD;  Location: [place];  Service: Orthopedics;  Laterality: Left;   IR GENERIC HISTORICAL  03/01/2016   IR ANGIO INTRA EXTRACRAN SEL COM CAROTID INNOMINATE UNI R MOD SED 03/01/2016 [name], MD [facility]   IR GENERIC HISTORICAL  03/01/2016   IR ENDOVASC INTRACRANIAL INF OTHER THAN THROMBO ART INC DIAG ANGIO 03/01/2016 [name], MD [facility]   IR GENERIC HISTORICAL  03/01/2016   IR INTRAVSC STENT CERV CAROTID W/O EMB-PROT MOD SED INC ANGIO 03/01/2016 [name], MD [facility]   IR GENERIC HISTORICAL  06/03/2016   IR RADIOLOGIST EVAL & MGMT 06/03/2016 [facility]   LOWER EXTREMITY ANGIOGRAPHY Left 02/09/2019   Procedure: LOWER EXTREMITY ANGIOGRAPHY;  Surgeon: Katha Cabal, MD;  Location: [place] [facility];  Service:  Cardiovascular;  Laterality: Left;   LOWER EXTREMITY ANGIOGRAPHY Left 03/10/2019   Procedure: LOWER EXTREMITY ANGIOGRAPHY;  Surgeon: Katha Cabal, MD;  Location: [place] [facility];  Service: Cardiovascular;  Laterality: Left;   LOWER EXTREMITY ANGIOGRAPHY Left 04/27/2019   Procedure: LOWER EXTREMITY ANGIOGRAPHY;  Surgeon: Katha Cabal, MD;  Location: [place] [facility];  Service: Cardiovascular;  Laterality: Left;   LOWER EXTREMITY ANGIOGRAPHY Left 06/08/2019   Procedure: Lower Extremity Angiography;  Surgeon: Katha Cabal, MD;  Location: [place] [facility];  Service: Cardiovascular;  Laterality: Left;   LOWER EXTREMITY ANGIOGRAPHY Left 04/03/2020   Procedure: Lower Extremity  Angiography;  Surgeon: Algernon Huxley, MD;  Location: [place] [facility];  Service: Cardiovascular;  Laterality: Left;   LOWER EXTREMITY ANGIOGRAPHY Right 10/20/2020   Procedure: Lower Extremity Angiography;  Surgeon: Katha Cabal, MD;  Location: [place] [facility];  Service: Cardiovascular;  Laterality: Right;   LOWER EXTREMITY ANGIOGRAPHY Left 01/23/2021   Procedure: LOWER EXTREMITY ANGIOGRAPHY;  Surgeon: Katha Cabal, MD;  Location: [place] [facility];  Service: Cardiovascular;  Laterality: Left;   LOWER EXTREMITY ANGIOGRAPHY Right 04/10/2021   Procedure: LOWER EXTREMITY ANGIOGRAPHY;  Surgeon: Katha Cabal, MD;  Location: [place] [facility];  Service: Cardiovascular;  Laterality: Right;   ORIF TOE FRACTURE Right 02/08/2014   Procedure: OPEN REDUCTION INTERNAL FIXATION Right METATARSAL  FRACTURE ;  Surgeon: Wylene Simmer, MD;  Location: [place];  Service: Orthopedics;  Laterality: Right;   PERIPHERAL VASCULAR CATHETERIZATION N/A 12/28/2015   Procedure: Abdominal Aortogram w/Lower Extremity;  Surgeon: Conrad [place] Ransom, MD;  Location: [place] [facility];  Service: Cardiovascular;  Laterality: N/A;   RADIOLOGY WITH ANESTHESIA N/A 03/01/2016   Procedure: RADIOLOGY WITH ANESTHESIA;  Surgeon: [name], MD;  Location: [place];  Service: Radiology;  Laterality: N/A;   VEIN HARVEST Right 02/28/2016   Procedure: RIGHT GREATER SAPHENOUS VEIN HARVEST;  Surgeon: Conrad Clarcona, MD;  Location: [place];  Service: Vascular;  Laterality: Right;    There were no vitals filed for this visit.   Subjective Assessment - 09/26/21 1152     Subjective Patient reports new onset of right shoulder pain. "It started today. Its hurting a lot." She is still having intermittent booty pain; Husband reports she has been doing better this week;    Patient is accompained by: Family member    Pertinent History 50yoF presenting to [facility] to address recent gradual progressive decline in mobility, family goals for  improved strength, to promote > participation in transfers to wheelchair and safety with ADL performance. Recent illness includes admission to [facility] on 03/07/21-03/10/21 due to COVID infection c AMS and fever. Pt then underwent outpatient recanalization/limb salvage of RLE 04/10/2021; LLE salvage performed in June 2022. Other PMH: CHF, DM, HTN, CVA, COPD, multiple remote toe amputations, stg 2 sacral ulcer. PLOF includes 3-4 years bed bound status, hoyerlifts to WC, totalA for ADL performance. Pt is cared for by family, as well as professional caregiver services.    Limitations Sitting;Walking;Lifting;Standing;House hold activities    How long can you sit comfortably? Pt spouse reports 2 hours. Must be careful d/t preventing and trying to heal wounds on bottom.    How long can you stand comfortably? unable    How long can you walk comfortably? Unable    Diagnostic tests 6/21 and 9/06 peripheral vascular catheterizatoin; 04/10/2021 Successful recanalization right lower extremity for limb salvage    Patient Stated Goals to strengthen pt's  trunk to improve mobility in chair, assist with ADLs    Currently in Pain? Yes    Pain Score --   unable to rate, reports high level right shoulder pain;   Pain Location Shoulder    Pain Orientation Right    Pain Descriptors / Indicators Aching    Pain Type Acute pain    Pain Onset Today    Pain Frequency Intermittent    Aggravating Factors  worse with movement    Pain Relieving Factors rest    Effect of Pain on Daily Activities decreased reaching tolerance;    Multiple Pain Sites No               TREATMENT: PT assessed RUE shoulder, limited overhead ROM, however patient reports pain in shoulder when coming down to extension and with IR motion; Minimal tenderness, pain is isolate with movement; Caregiver reports she didn't complain of pain during dressing this AM;  Therapeutic activity:  Sitting in wheelchair: PT instructed patient in forward weight shift,  max A Required cues for leaning trunk forward with cues for putting LUE hand on arm rest and therapist supporting shoulders for forward shift, max A +1  Sitting forward in wheelchair, side/side lean x2 reps each direction with complaints of RUE shoulder pain with right lateral lean;  Attempted slide board transfer to right side to favor RUE and focus on pushing with LUE Required max A +2 for positioning, unable to complete transfer due to increased RUE shoulder pain;  Ex: Instructed patient in seated exercise: Hip flexion march with AAROM to facilitate increased ROM x10 reps each LE, required increased time and verbal cues to increase participation; Hip abduction/ER red tband x10 reps with visual cues to increase ROM LAQ x10 reps with cues to increase knee flexion between repetition;  Patient was dependent +2 for repositioning in chair  Educated patient and caregiver to apply ice to right shoulder and see if it eases off as pain is new onset. Seems to be musculoskeletal as it is worse with shoulder extension and IR;                           PT Education - 09/26/21 1153     Education Details transfer technique/positioning;    Person(s) Educated Patient    Methods Explanation;Verbal cues    Comprehension Verbalized understanding;Returned demonstration;Verbal cues required;Need further instruction              PT Short Term Goals - 08/22/21 1308       PT SHORT TERM GOAL #1   Title Patient will be independent in home exercise program to improve strength/mobility for better functional independence with ADLs.    Baseline 10/6: to be initiated, 11/8: caregiver reports she is doing them 3x a day; 1/18: doesn't do much exercise    Time 4    Period Weeks    Status Partially Met    Target Date 09/19/21               PT Long Term Goals - 08/22/21 1309       PT LONG TERM GOAL #1   Title Patient will increase FOTO score to equal to or greater than 44 to  demonstrate statistically significant improvement in mobility and quality of life.    Baseline 10/6: 26, 11/8: 12%, 1/18: 12%    Time 12    Period Weeks    Status Not Met    Target  Date 11/14/21      PT LONG TERM GOAL #2   Title Patient will roll L and R in bed with mod A for decreased caregiver burden for ADLs and toileting.    Baseline 10/6: per pt spouse, pt currently dependent, 11/8: mod A, inconsistent, 1/18: needs min A    Time 12    Period Weeks    Status Partially Met    Target Date 11/14/21      PT LONG TERM GOAL #3   Title Patient will sit upright in WC, with BUE support and without leaning on backrest for 10 minutes for ADL performance and pressure relief.    Baseline 10/6: pt exhibits core contraction but unable to sit upright, 11/8: able to sit upright for <20 sec with mod VCs for erect positioning, 1/18: able to hold for 60 sec with mod VCs    Time 12    Period Weeks    Status Partially Met    Target Date 11/14/21      PT LONG TERM GOAL #4   Title Pt will demonstrate ability to utilize BUEs to weight-shift to each side in WC and maintain weight shift for 30 seconds to assist in pressure relief and reduce risk of wound formation.    Baseline 10/6: pt currently unable, 11/8: unable to initiate weight shift, able to use UE but unable to shift hips without assistance; 1/18: requires max cues and min A for weight shift    Time 12    Period Weeks    Status Partially Met    Target Date 11/14/21                   Plan - 09/26/21 1551     Clinical Impression Statement Patient motivated and participated fair within session. She does report increased RUE shoulder discomfort which limited tolerance with repositioning. Attempted slide board transfer but patient unable to tolerate. She does require max A +2 for positioning. She was instructed in seated LE strengthening with mod VCs and tactile cues to increase AROM. Patient often distracted requiring verbal cues to  re-orient to task. she is dependent to reposition in wheelchair. Educated patient/caregiver to try ice right shoulder for 20 min throughout the day and see how the shoulder feels tomorrow. Unsure of cause of shoulder pain but acute onset (today); Pain does seem to be musculoskeletal in nature and is worse with should extension and IR; She would benefit from additional skilled PT Intervention to improve strength and mobility and reduce pain with ADLs.    Personal Factors and Comorbidities Age;Time since onset of injury/illness/exacerbation;Comorbidity 3+;Past/Current Experience;Education;Social Background;Transportation;Sex;Fitness    Comorbidities diastolic congestive heart failure, diabetes mellitus, diabetic neuropathy, hypertension, hyperlipidemia, GERD, peripheral vascular disease, CVA. on 9/2 had amputation of L foot second toe MTP joint and LLE revascularization, recent hx of COVID19    Examination-Activity Limitations Bathing;Hygiene/Grooming;Squat;Bed Mobility;Stairs;Lift;Bend;Locomotion Level;Stand;Caring for [employer];Toileting;Carry;Self Feeding;Transfers;Continence;Dressing;Other;Sit    Examination-Participation Restrictions Cleaning;Shop;Community Activity;Meal Prep;Driving;Other    Stability/Clinical Decision Making Evolving/Moderate complexity    Rehab Potential Poor    PT Frequency 2x / week    PT Duration 12 weeks    PT Treatment/Interventions ADLs/Self Care Home Management;DME Instruction;Functional mobility training;Therapeutic activities;Therapeutic exercise;Neuromuscular re-education;Patient/family education;Wheelchair mobility training;Passive range of motion;Energy conservation;Cryotherapy;Biofeedback;Electrical Stimulation;Iontophoresis 58m/ml Dexamethasone;Moist Heat;Traction;Ultrasound;Stair training;Cognitive remediation;Balance training;Orthotic Fit/Training;Manual techniques;Manual lymph drainage;Splinting;Taping;Vasopneumatic Device;Vestibular;Visual/perceptual  remediation/compensation;Gait training    PT Next Visit Plan bed mobility, pressure relief techniques; Progressive Therex for strengthening; Transer training; pregait activities.    PT Home Exercise  Plan Previous- Seated lateral weight shift pressure relief technique in wheelchair to be performed 5 days/week for 2 sets of 10 repetitions each side.  PT provided patient with picture of technique as well.  05/21/2021- Access Code: [email or web address]    Consulted and Agree with Plan of Care Family member/caregiver    Family Member Consulted Spouse             Patient will benefit from skilled therapeutic intervention in order to improve the following deficits and impairments:  Decreased cognition, Decreased skin integrity, Impaired sensation, Decreased mobility, Postural dysfunction, Decreased activity tolerance, Decreased endurance, Decreased range of motion, Decreased strength, Decreased balance, Decreased safety awareness, Increased edema, Impaired flexibility, Decreased coordination, Cardiopulmonary status limiting activity, Abnormal gait, Impaired UE functional use, Impaired tone, Improper body mechanics, Pain, Difficulty walking, Decreased knowledge of use of DME, Hypomobility, Impaired perceived functional ability  Visit Diagnosis: Muscle weakness (generalized)  Difficulty in walking, not elsewhere classified  Unsteadiness on feet  Abnormality of gait and mobility  Other lack of coordination  Abnormal posture  Other abnormalities of gait and mobility     Problem List Patient Active Problem List   Diagnosis Date Noted   Atherosclerosis of native arteries of extremity with rest pain ([place]) 04/20/2021   COVID-19 virus infection 03/07/2021   Acute febrile illness 03/07/2021   Hypoalbuminemia 03/07/2021   Hyperglycemia due to diabetes mellitus ([place]) 03/07/2021   Elevated troponin 03/07/2021   Constipation 03/07/2021   COPD (chronic obstructive pulmonary disease) ([place]) 10/20/2020   GERD  (gastroesophageal reflux disease) 10/20/2020   Physical deconditioning 10/20/2020   Cellulitis 10/19/2020   Necrotic toes (HCC)    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)    Diabetic neuropathy (HCC)    Gangrene of toe of both feet ([place]) 06/06/2019   Peripheral arterial disease ([place]) 04/27/2019   ESBL (extended spectrum beta-lactamase) producing bacteria infection 12/25/2018   PVD (peripheral vascular disease) ([place]) 09/12/2016   Ulcer of right lower extremity ([place]) 08/29/2016   Acute on chronic diastolic heart failure ([place]) 07/08/2016   HTN (hypertension) 07/05/2016   Sensorineural hearing loss (SNHL), 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   Chronic diastolic CHF (congestive heart failure) ([place]) 03/10/2016   HLD (hyperlipidemia) 03/09/2016   Acute respiratory acidosis (HCC) 03/05/2016   Anemia, iron deficiency 03/03/2016   Cerebrovascular accident (CVA) due to thrombosis of precerebral artery ([place]) 03/01/2016   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 ([place]) 03/01/2016   Atherosclerosis of native arteries of the extremities with gangrene ([place]) 12/08/2015   Anemia, chronic disease 04/27/2014   Unspecified hereditary and idiopathic peripheral neuropathy 03/14/2014   DM2 (diabetes mellitus, type 2) ([place]) 02/14/2014   Aortic stenosis 02/04/2014   Closed left subtrochanteric femur fracture ([place]) 02/03/2014   Hip fracture, left ([place]) 02/03/2014   Fibula fracture 02/03/2014   MTP instability 02/03/2014   Hip fracture ([place]) 02/03/2014    Ife Vitelli, PT, DPT 09/26/2021, 3:56 PM  [place] [facility] MAIN [facility] SERVICES [address] [place], [place], [address] Phone: [phone]   Fax:  [phone]  Name: AKEELA BUSK MRN: [id number] Date of Birth: 1940-08-19

## 2021-10-01 ENCOUNTER — Other Ambulatory Visit: Payer: Self-pay

## 2021-10-01 ENCOUNTER — Ambulatory Visit: Payer: Medicare HMO

## 2021-10-01 DIAGNOSIS — R262 Difficulty in walking, not elsewhere classified: Secondary | ICD-10-CM

## 2021-10-01 DIAGNOSIS — R2689 Other abnormalities of gait and mobility: Secondary | ICD-10-CM | POA: Diagnosis not present

## 2021-10-01 DIAGNOSIS — M6281 Muscle weakness (generalized): Secondary | ICD-10-CM

## 2021-10-01 DIAGNOSIS — R2681 Unsteadiness on feet: Secondary | ICD-10-CM

## 2021-10-01 DIAGNOSIS — R269 Unspecified abnormalities of gait and mobility: Secondary | ICD-10-CM

## 2021-10-01 NOTE — Therapy (Signed)
[place] MAIN [facility] SERVICES [address] [place], [place], [address] Phone: [phone]   Fax:  [phone]  Physical Therapy Treatment/Physical Therapy Progress Note   Dates of reporting period  08/27/2021   to   10/01/2021  Patient Details  Name: [name] MRN: [id number] Date of Birth: [birth year] Referring Provider (PT): Sharin Grave   Encounter Date: 10/01/2021   PT End of Session - 10/01/21 1520     Visit Number 30    Number of Visits 69    Date for PT Re-Evaluation 11/14/21    Authorization Type [employer]; Medicaid    Authorization Time Period 1/18-3/31, 24 visits    Authorization - Visit Number 1    Authorization - Number of Visits 24    PT Start Time 1430    PT Stop Time 3614    PT Time Calculation (min) 46 min    Equipment Utilized During Treatment Gait belt    Activity Tolerance Patient limited by fatigue    Behavior During Therapy Flat affect             Past Medical History:  Diagnosis Date   Acute heart failure ([place])    Aortic stenosis    CHF (congestive heart failure) ([place])    Complication of anesthesia    Hard to wake patient up after having anesthesia   Diabetes mellitus without complication ([place])    Diabetic neuropathy ([place])    Takes Lyrica   Edema of both legs    Takes Lasix   GERD (gastroesophageal reflux disease)    High cholesterol    HTN (hypertension)    Hypertension    PAD (peripheral artery disease) ([place])    Shortness of breath dyspnea    Spasm of back muscles    Stroke ([place])    Wound, open    Right great toe    Past Surgical History:  Procedure Laterality Date   AMPUTATION TOE Left 06/09/2019   Procedure: Left third toe and partial great toe amputation and debridement;  Surgeon: Katha Cabal, MD;  Location: [facility];  Service: Vascular;  Laterality: Left;   AMPUTATION TOE Left 04/06/2020   Procedure: AMPUTATION LEFT SECOND TOE;  Surgeon: Samara Deist, DPM;  Location:  [facility];  Service: Podiatry;  Laterality: Left;   BYPASS GRAFT POPLITEAL TO TIBIAL Right 02/28/2016   Procedure: BYPASS GRAFT RIGHT BELOW KNEE POPLITEAL TO PERONEAL USING REVERSED RIGHT GREATER SAPHENOUS VEIN;  Surgeon: Conrad [place], MD;  Location: [place];  Service: Vascular;  Laterality: Right;   CYST EXCISION     Abdomen   EYE SURGERY Bilateral    Cataract removal   INTRAMEDULLARY (IM) NAIL INTERTROCHANTERIC Left 02/04/2014   Procedure: INTRAMEDULLARY (IM) NAIL INTERTROCHANTRIC FEMORAL;  Surgeon: Mauri Pole, MD;  Location: [place];  Service: Orthopedics;  Laterality: Left;   IR GENERIC HISTORICAL  03/01/2016   IR ANGIO INTRA EXTRACRAN SEL COM CAROTID INNOMINATE UNI R MOD SED 03/01/2016 [name], MD [facility]   IR GENERIC HISTORICAL  03/01/2016   IR ENDOVASC INTRACRANIAL INF OTHER THAN THROMBO ART INC DIAG ANGIO 03/01/2016 [name], MD [facility]   IR GENERIC HISTORICAL  03/01/2016   IR INTRAVSC STENT CERV CAROTID W/O EMB-PROT MOD SED INC ANGIO 03/01/2016 [name], MD [facility]   IR GENERIC HISTORICAL  06/03/2016   IR RADIOLOGIST EVAL & MGMT 06/03/2016 [facility]   LOWER EXTREMITY ANGIOGRAPHY Left 02/09/2019   Procedure: LOWER EXTREMITY ANGIOGRAPHY;  Surgeon: Katha Cabal, MD;  Location: [place] [facility];  Service: Cardiovascular;  Laterality: Left;   LOWER EXTREMITY ANGIOGRAPHY Left 03/10/2019   Procedure: LOWER EXTREMITY ANGIOGRAPHY;  Surgeon: Katha Cabal, MD;  Location: [place] [facility];  Service: Cardiovascular;  Laterality: Left;   LOWER EXTREMITY ANGIOGRAPHY Left 04/27/2019   Procedure: LOWER EXTREMITY ANGIOGRAPHY;  Surgeon: Katha Cabal, MD;  Location: [place] [facility];  Service: Cardiovascular;  Laterality: Left;   LOWER EXTREMITY ANGIOGRAPHY Left 06/08/2019   Procedure: Lower Extremity Angiography;  Surgeon: Katha Cabal, MD;  Location: [place] [facility];  Service: Cardiovascular;  Laterality: Left;   LOWER  EXTREMITY ANGIOGRAPHY Left 04/03/2020   Procedure: Lower Extremity Angiography;  Surgeon: Algernon Huxley, MD;  Location: [place] [facility];  Service: Cardiovascular;  Laterality: Left;   LOWER EXTREMITY ANGIOGRAPHY Right 10/20/2020   Procedure: Lower Extremity Angiography;  Surgeon: Katha Cabal, MD;  Location: [place] [facility];  Service: Cardiovascular;  Laterality: Right;   LOWER EXTREMITY ANGIOGRAPHY Left 01/23/2021   Procedure: LOWER EXTREMITY ANGIOGRAPHY;  Surgeon: Katha Cabal, MD;  Location: [place] [facility];  Service: Cardiovascular;  Laterality: Left;   LOWER EXTREMITY ANGIOGRAPHY Right 04/10/2021   Procedure: LOWER EXTREMITY ANGIOGRAPHY;  Surgeon: Katha Cabal, MD;  Location: [place] [facility];  Service: Cardiovascular;  Laterality: Right;   ORIF TOE FRACTURE Right 02/08/2014   Procedure: OPEN REDUCTION INTERNAL FIXATION Right METATARSAL  FRACTURE ;  Surgeon: Wylene Simmer, MD;  Location: [place];  Service: Orthopedics;  Laterality: Right;   PERIPHERAL VASCULAR CATHETERIZATION N/A 12/28/2015   Procedure: Abdominal Aortogram w/Lower Extremity;  Surgeon: Conrad [place]ton, MD;  Location: [place] [facility];  Service: Cardiovascular;  Laterality: N/A;   RADIOLOGY WITH ANESTHESIA N/A 03/01/2016   Procedure: RADIOLOGY WITH ANESTHESIA;  Surgeon: [name], MD;  Location: [place];  Service: Radiology;  Laterality: N/A;   VEIN HARVEST Right 02/28/2016   Procedure: RIGHT GREATER SAPHENOUS VEIN HARVEST;  Surgeon: Conrad Altus, MD;  Location: [place];  Service: Vascular;  Laterality: Right;    There were no vitals filed for this visit.   Subjective Assessment - 10/01/21 1430     Patient is accompained by: Family member    Pertinent History 53yoF presenting to [place] to address recent gradual progressive decline in mobility, family goals for improved strength, to promote > participation in transfers to wheelchair and safety with ADL performance. Recent illness includes  admission to [facility] on 03/07/21-03/10/21 due to COVID infection c AMS and fever. Pt then underwent outpatient recanalization/limb salvage of RLE 04/10/2021; LLE salvage performed in June 2022. Other PMH: CHF, DM, HTN, CVA, COPD, multiple remote toe amputations, stg 2 sacral ulcer. PLOF includes 3-4 years bed bound status, hoyerlifts to WC, totalA for ADL performance. Pt is cared for by family, as well as professional caregiver services.    Limitations Sitting;Walking;Lifting;Standing;House hold activities    How long can you sit comfortably? Pt spouse reports 2 hours. Must be careful d/t preventing and trying to heal wounds on bottom.    How long can you stand comfortably? unable    How long can you walk comfortably? Unable    Diagnostic tests 6/21 and 9/06 peripheral vascular catheterizatoin; 04/10/2021 Successful recanalization right lower extremity for limb salvage    Patient Stated Goals to strengthen pt's trunk to improve mobility in chair, assist with ADLs    Pain Onset Today  INTERVENTIONS:   Reassessment of Goals:   STG:   LTG: See goal section for specific details.   Therapeutic activities:  Transfer training from w/c to mat table- Max assist to stand and pivot- patient with difficulty leaning forward and max difficulty moving her feet during pivot.   Patient was then assessed sitting at [facility]- without back support- unable to maintain > 30 sec without assistance.   Positioned from sit to supine- Max assist as patient did not really effectively pick up her legs.   Rolling- Min//mod assist to right and back to left- Max VC for correct technique. Patient required physical assist with rolling especially moving her legs- she was able to reach and roll her upper torso well with min assist.     Patient performed sit to stand back from edge of mat back to w/c- Max VC for feet/hand position and max assist with SPT with very limited ability to move either leg during  transfer                        PT Short Term Goals - 10/01/21 1439       PT SHORT TERM GOAL #1   Title Patient will be independent in home exercise program to improve strength/mobility for better functional independence with ADLs.    Baseline 10/6: to be initiated, 11/8: caregiver reports she is doing them 3x a day; 1/18: doesn't do much exercise; 10/01/2021= Husband reports she is doing better with her HEP and does better exercising her her son. He states no questions at this time.    Time 4    Period Weeks    Status Partially Met    Target Date 11/12/21               PT Long Term Goals - 10/01/21 1440       PT LONG TERM GOAL #1   Title Patient will increase FOTO score to equal to or greater than 44 to demonstrate statistically significant improvement in mobility and quality of life.    Baseline 10/6: 26, 11/8: 12%, 1/18: 12%    Time 12    Period Weeks    Status Not Met    Target Date 11/14/21      PT LONG TERM GOAL #2   Title Patient will roll L and R in bed with mod A for decreased caregiver burden for ADLs and toileting.    Baseline 10/6: per pt spouse, pt currently dependent, 11/8: mod A, inconsistent, 1/18: needs min A; 10/01/2021=Min//mod assist to right and back to left- Max VC for correct technique. Patient required physical assist with rolling especially moving her legs- she was able to reach and roll her upper torso well with min assist.    Time 12    Period Weeks    Status Partially Met    Target Date 11/14/21      PT LONG TERM GOAL #3   Title Patient will sit upright in WC, with BUE support and without leaning on backrest for 10 minutes for ADL performance and pressure relief.    Baseline 10/6: pt exhibits core contraction but unable to sit upright, 11/8: able to sit upright for <20 sec with mod VCs for erect positioning, 1/18: able to hold for 60 sec with mod VCs. 10/01/2021=Patient was then assessed sitting at [facility]- without back support- unable  to maintain > 30 sec without assistance.    Time 12    Period Weeks  Status Partially Met    Target Date 11/14/21      PT LONG TERM GOAL #4   Title Pt will demonstrate ability to utilize BUEs to weight-shift to each side in WC and maintain weight shift for 30 seconds to assist in pressure relief and reduce risk of wound formation.    Baseline 10/6: pt currently unable, 11/8: unable to initiate weight shift, able to use UE but unable to shift hips without assistance; 1/18: requires max cues and min A for weight shift    Time 12    Period Weeks    Status Partially Met    Target Date 11/14/21                   Plan - 10/01/21 1520     Clinical Impression Statement Patient presents with more participation today and husband reports she is doing more at home. She was able to demo some progress including improved transfer and standing ability- more cooperative in session and did not complain of nay pain. She was able to stand with max assist and able to transfer today with max assist. Patient's condition has the potential to improve in response to therapy. Maximum improvement is yet to be obtained. The anticipated improvement is attainable and reasonable in a generally predictable time.    Personal Factors and Comorbidities Age;Time since onset of injury/illness/exacerbation;Comorbidity 3+;Past/Current Experience;Education;Social Background;Transportation;Sex;Fitness    Comorbidities diastolic congestive heart failure, diabetes mellitus, diabetic neuropathy, hypertension, hyperlipidemia, GERD, peripheral vascular disease, CVA. on 9/2 had amputation of L foot second toe MTP joint and LLE revascularization, recent hx of COVID19    Examination-Activity Limitations Bathing;Hygiene/Grooming;Squat;Bed Mobility;Stairs;Lift;Bend;Locomotion Level;Stand;Caring for [employer];Toileting;Carry;Self Feeding;Transfers;Continence;Dressing;Other;Sit    Examination-Participation Restrictions  Cleaning;Shop;Community Activity;Meal Prep;Driving;Other    Stability/Clinical Decision Making Evolving/Moderate complexity    Rehab Potential Poor    PT Frequency 2x / week    PT Duration 12 weeks    PT Treatment/Interventions ADLs/Self Care Home Management;DME Instruction;Functional mobility training;Therapeutic activities;Therapeutic exercise;Neuromuscular re-education;Patient/family education;Wheelchair mobility training;Passive range of motion;Energy conservation;Cryotherapy;Biofeedback;Electrical Stimulation;Iontophoresis 63m/ml Dexamethasone;Moist Heat;Traction;Ultrasound;Stair training;Cognitive remediation;Balance training;Orthotic Fit/Training;Manual techniques;Manual lymph drainage;Splinting;Taping;Vasopneumatic Device;Vestibular;Visual/perceptual remediation/compensation;Gait training    PT Next Visit Plan bed mobility, pressure relief techniques; Progressive Therex for strengthening; Transer training; pregait activities.    PT Home Exercise Plan Previous- Seated lateral weight shift pressure relief technique in wheelchair to be performed 5 days/week for 2 sets of 10 repetitions each side.  PT provided patient with picture of technique as well.  05/21/2021- Access Code: [email or web address]   Consulted and Agree with Plan of Care Family member/caregiver    Family Member Consulted Spouse             Patient will benefit from skilled therapeutic intervention in order to improve the following deficits and impairments:  Decreased cognition, Decreased skin integrity, Impaired sensation, Decreased mobility, Postural dysfunction, Decreased activity tolerance, Decreased endurance, Decreased range of motion, Decreased strength, Decreased balance, Decreased safety awareness, Increased edema, Impaired flexibility, Decreased coordination, Cardiopulmonary status limiting activity, Abnormal gait, Impaired UE functional use, Impaired tone, Improper body mechanics, Pain, Difficulty walking, Decreased knowledge of  use of DME, Hypomobility, Impaired perceived functional ability  Visit Diagnosis: Muscle weakness (generalized)  Difficulty in walking, not elsewhere classified  Abnormality of gait and mobility  Unsteadiness on feet     Problem List Patient Active Problem List   Diagnosis Date Noted   Atherosclerosis of native arteries of extremity with rest pain ([place] 04/20/2021   COVID-19 virus infection 03/07/2021   Acute febrile  illness 03/07/2021   Hypoalbuminemia 03/07/2021   Hyperglycemia due to diabetes mellitus ([place]) 03/07/2021   Elevated troponin 03/07/2021   Constipation 03/07/2021   COPD (chronic obstructive pulmonary disease) ([place]) 10/20/2020   GERD (gastroesophageal reflux disease) 10/20/2020   Physical deconditioning 10/20/2020   Cellulitis 10/19/2020   Necrotic toes (H[facility])    Insulin dependent diabetes mellitus type IA ([place])    Diabetic neuropathy (H[facility])    Gangrene of toe of both feet ([place]) 06/06/2019   Peripheral arterial disease ([place]) 04/27/2019   ESBL (extended spectrum beta-lactamase) producing bacteria infection 12/25/2018   PVD (peripheral vascular disease) ([place]) 09/12/2016   Ulcer of right lower extremity ([place]) 08/29/2016   Acute on chronic diastolic heart failure ([place]) 07/08/2016   HTN (hypertension) 07/05/2016   Sensorineural hearing loss (SNHL), 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   Chronic diastolic CHF (congestive heart failure) ([place]) 03/10/2016   HLD (hyperlipidemia) 03/09/2016   Acute respiratory acidosis ([place]) 03/05/2016   Anemia, iron deficiency 03/03/2016   Cerebrovascular accident (CVA) due to thrombosis of precerebral artery ([place]) 03/01/2016   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 ([place]) 03/01/2016   Atherosclerosis of native arteries of the extremities with gangrene ([place]) 12/08/2015   Anemia, chronic disease 04/27/2014   Unspecified hereditary and idiopathic peripheral neuropathy 03/14/2014   DM2 (diabetes mellitus, type 2) ([place]) 02/14/2014    Aortic stenosis 02/04/2014   Closed left subtrochanteric femur fracture ([place]) 02/03/2014   Hip fracture, left ([place]) 02/03/2014   Fibula fracture 02/03/2014   MTP instability 02/03/2014   Hip fracture ([place]) 02/03/2014    Lewis Moccasin, PT 10/01/2021, 4:53 PM  [place] MAIN [facility] SERVICES [place], [place], [address] Phone: [phone]   Fax:  [phone]  Name: [name] MRN: [id number] Date of Birth: 1941-07-05

## 2021-10-03 ENCOUNTER — Ambulatory Visit: Payer: Medicare HMO | Attending: Family Medicine

## 2021-10-03 ENCOUNTER — Other Ambulatory Visit: Payer: Self-pay

## 2021-10-03 DIAGNOSIS — R2689 Other abnormalities of gait and mobility: Secondary | ICD-10-CM | POA: Insufficient documentation

## 2021-10-03 DIAGNOSIS — R2681 Unsteadiness on feet: Secondary | ICD-10-CM | POA: Diagnosis present

## 2021-10-03 DIAGNOSIS — M6281 Muscle weakness (generalized): Secondary | ICD-10-CM | POA: Insufficient documentation

## 2021-10-03 DIAGNOSIS — R262 Difficulty in walking, not elsewhere classified: Secondary | ICD-10-CM | POA: Diagnosis present

## 2021-10-03 DIAGNOSIS — R278 Other lack of coordination: Secondary | ICD-10-CM | POA: Insufficient documentation

## 2021-10-03 DIAGNOSIS — R269 Unspecified abnormalities of gait and mobility: Secondary | ICD-10-CM | POA: Insufficient documentation

## 2021-10-03 DIAGNOSIS — R293 Abnormal posture: Secondary | ICD-10-CM | POA: Insufficient documentation

## 2021-10-03 NOTE — Therapy (Signed)
[place] MAIN [facility] SERVICES [address] [place], [place], [address] Phone: [phone]   Fax:  [phone]  Physical Therapy Treatment  Patient Details  Name: [name] MRN: [id number] Date of Birth: 08/02/1941 Referring Provider (PT): Sharin Grave   Encounter Date: 10/03/2021   PT End of Session - 10/03/21 1407     Visit Number 31    Number of Visits 48    Date for PT Re-Evaluation 11/14/21    Authorization Type [employer]; Medicaid    Authorization Time Period 1/18-3/31, 24 visits    Authorization - Visit Number 1    Authorization - Number of Visits 24    PT Start Time 1152    PT Stop Time 1231    PT Time Calculation (min) 39 min    Equipment Utilized During Treatment Gait belt    Activity Tolerance Patient limited by fatigue    Behavior During Therapy Flat affect             Past Medical History:  Diagnosis Date   Acute heart failure ([place])    Aortic stenosis    CHF (congestive heart failure) ([place])    Complication of anesthesia    Hard to wake patient up after having anesthesia   Diabetes mellitus without complication ([place])    Diabetic neuropathy ([place])    Takes Lyrica   Edema of both legs    Takes Lasix   GERD (gastroesophageal reflux disease)    High cholesterol    HTN (hypertension)    Hypertension    PAD (peripheral artery disease) ([place])    Shortness of breath dyspnea    Spasm of back muscles    Stroke ([place])    Wound, open    Right great toe    Past Surgical History:  Procedure Laterality Date   AMPUTATION TOE Left 06/09/2019   Procedure: Left third toe and partial great toe amputation and debridement;  Surgeon: Katha Cabal, MD;  Location: [facility];  Service: Vascular;  Laterality: Left;   AMPUTATION TOE Left 04/06/2020   Procedure: AMPUTATION LEFT SECOND TOE;  Surgeon: Samara Deist, DPM;  Location: [facility];  Service: Podiatry;  Laterality: Left;   BYPASS GRAFT POPLITEAL TO TIBIAL Right  02/28/2016   Procedure: BYPASS GRAFT RIGHT BELOW KNEE POPLITEAL TO PERONEAL USING REVERSED RIGHT GREATER SAPHENOUS VEIN;  Surgeon: Conrad Lakeway, MD;  Location: [place];  Service: Vascular;  Laterality: Right;   CYST EXCISION     Abdomen   EYE SURGERY Bilateral    Cataract removal   INTRAMEDULLARY (IM) NAIL INTERTROCHANTERIC Left 02/04/2014   Procedure: INTRAMEDULLARY (IM) NAIL INTERTROCHANTRIC FEMORAL;  Surgeon: Mauri Pole, MD;  Location: [place];  Service: Orthopedics;  Laterality: Left;   IR GENERIC HISTORICAL  03/01/2016   IR ANGIO INTRA EXTRACRAN SEL COM CAROTID INNOMINATE UNI R MOD SED 03/01/2016 [name], MD [facility]   IR GENERIC HISTORICAL  03/01/2016   IR ENDOVASC INTRACRANIAL INF OTHER THAN THROMBO ART INC DIAG ANGIO 03/01/2016 [name], MD [facility]   IR GENERIC HISTORICAL  03/01/2016   IR INTRAVSC STENT CERV CAROTID W/O EMB-PROT MOD SED INC ANGIO 03/01/2016 [name], MD [facility]   IR GENERIC HISTORICAL  06/03/2016   IR RADIOLOGIST EVAL & MGMT 06/03/2016 [facility]   LOWER EXTREMITY ANGIOGRAPHY Left 02/09/2019   Procedure: LOWER EXTREMITY ANGIOGRAPHY;  Surgeon: Katha Cabal, MD;  Location: [place] [facility];  Service: Cardiovascular;  Laterality:  Left;   LOWER EXTREMITY ANGIOGRAPHY Left 03/10/2019   Procedure: LOWER EXTREMITY ANGIOGRAPHY;  Surgeon: Katha Cabal, MD;  Location: [place] [facility];  Service: Cardiovascular;  Laterality: Left;   LOWER EXTREMITY ANGIOGRAPHY Left 04/27/2019   Procedure: LOWER EXTREMITY ANGIOGRAPHY;  Surgeon: Katha Cabal, MD;  Location: [place];  Service: Cardiovascular;  Laterality: Left;   LOWER EXTREMITY ANGIOGRAPHY Left 06/08/2019   Procedure: Lower Extremity Angiography;  Surgeon: Katha Cabal, MD;  Location: [place] [facility];  Service: Cardiovascular;  Laterality: Left;   LOWER EXTREMITY ANGIOGRAPHY Left 04/03/2020   Procedure: Lower Extremity Angiography;  Surgeon: Algernon Huxley, MD;  Location: [place] [facility];  Service: Cardiovascular;  Laterality: Left;   LOWER EXTREMITY ANGIOGRAPHY Right 10/20/2020   Procedure: Lower Extremity Angiography;  Surgeon: Katha Cabal, MD;  Location: [place] [facility];  Service: Cardiovascular;  Laterality: Right;   LOWER EXTREMITY ANGIOGRAPHY Left 01/23/2021   Procedure: LOWER EXTREMITY ANGIOGRAPHY;  Surgeon: Katha Cabal, MD;  Location: [place] [facility];  Service: Cardiovascular;  Laterality: Left;   LOWER EXTREMITY ANGIOGRAPHY Right 04/10/2021   Procedure: LOWER EXTREMITY ANGIOGRAPHY;  Surgeon: Katha Cabal, MD;  Location: [place] [facility];  Service: Cardiovascular;  Laterality: Right;   ORIF TOE FRACTURE Right 02/08/2014   Procedure: OPEN REDUCTION INTERNAL FIXATION Right METATARSAL  FRACTURE ;  Surgeon: Wylene Simmer, MD;  Location: [place];  Service: Orthopedics;  Laterality: Right;   PERIPHERAL VASCULAR CATHETERIZATION N/A 12/28/2015   Procedure: Abdominal Aortogram w/Lower Extremity;  Surgeon: Conrad Monterey, MD;  Location: [place] [facility];  Service: Cardiovascular;  Laterality: N/A;   RADIOLOGY WITH ANESTHESIA N/A 03/01/2016   Procedure: RADIOLOGY WITH ANESTHESIA;  Surgeon: [name], MD;  Location: [facility];  Service: Radiology;  Laterality: N/A;   VEIN HARVEST Right 02/28/2016   Procedure: RIGHT GREATER SAPHENOUS VEIN HARVEST;  Surgeon: Conrad Paragon, MD;  Location: [place];  Service: Vascular;  Laterality: Right;    There were no vitals filed for this visit.   Subjective Assessment - 10/03/21 1152     Subjective Pt presents to PT session with spouse. Pt spouse reports he will be having knee replacement on the 6th (this coming Monday), so his son, [name], will be taking pt to appointments. Pt spouse says their chlidren will be able to help out with pt's PT at home. Pt reports bottom and LE pain on R side and some pain on L hip region currently (chronic).    Patient is accompained by: Family  member    Pertinent History 73yoF presenting to [facility] to address recent gradual progressive decline in mobility, family goals for improved strength, to promote > participation in transfers to wheelchair and safety with ADL performance. Recent illness includes admission to [facility] on 03/07/21-03/10/21 due to COVID infection c AMS and fever. Pt then underwent outpatient recanalization/limb salvage of RLE 04/10/2021; LLE salvage performed in June 2022. Other PMH: CHF, DM, HTN, CVA, COPD, multiple remote toe amputations, stg 2 sacral ulcer. PLOF includes 3-4 years bed bound status, hoyerlifts to WC, totalA for ADL performance. Pt is cared for by family, as well as professional caregiver services.    Limitations Sitting;Walking;Lifting;Standing;House hold activities    How long can you sit comfortably? Pt spouse reports 2 hours. Must be careful d/t preventing and trying to heal wounds on bottom.    How long can you stand comfortably? unable    How long can you  walk comfortably? Unable    Diagnostic tests 6/21 and 9/06 peripheral vascular catheterizatoin; 04/10/2021 Successful recanalization right lower extremity for limb salvage    Patient Stated Goals to strengthen pt's trunk to improve mobility in chair, assist with ADLs    Currently in Pain? Yes    Pain Location Leg    Pain Orientation Right;Left    Pain Onset More than a month ago                TREATMENT:  TherEx-  PT-assisted crunch in chair 12x max a to promote anterior lean in chair  Manual resistance lateral trunk strengthening 8x with 3 sec holds B Manually resisted seated crunch 8x with 3-5 sec holds, PT providing anterior to posterior resistance --progressed to cuing for anterior lean/reach. Pt with some initial difficulty following cues but then able to perform multiple partial reps (activation felt, scapula do not fully clear chair).  Assisted hip march BLE x 10-15 reps x 4 sets with and without using p.ball as TC/to promote mm  power.  Active knee ext BLE x 12 reps x 2 sets --progressed to kciking ball out PT's hands x multiple reps each LE. Pt does exhibit improvement with LE muscular power when cued to kick ball. Pt very engaged in activity.   TherAct PT instructs pt through pressure relief technique in chair - PT provides modeling of lateral and anteior weight shift technique using UEs and contralateral LE to weight shift. Pt able to successfully model technique but currently has insufficient strength for effective/sustained pressure relief.     PT reviews HEP with pt and her spouse: Access Code: [email or web address] URL: [email or web address] Date: 10/03/2021 Prepared by: Ricard Dillon  Exercises Seated March - 1 x daily - 5 x weekly - 3 sets - 10 reps  Pt educated throughout session about proper posture and technique with exercises. Improved exercise technique, movement at target joints, use of target muscles after min to mod verbal, visual, tactile cues.      PT Education - 10/03/21 1406     Education Details pressure relief technique, exercise technique/body mechanics    Person(s) Educated Patient    Methods Explanation;Demonstration;Tactile cues;Handout;Verbal cues    Comprehension Verbalized understanding;Tactile cues required;Returned demonstration;Verbal cues required;Need further instruction              PT Short Term Goals - 10/01/21 1439       PT SHORT TERM GOAL #1   Title Patient will be independent in home exercise program to improve strength/mobility for better functional independence with ADLs.    Baseline 10/6: to be initiated, 11/8: caregiver reports she is doing them 3x a day; 1/18: doesn't do much exercise; 10/01/2021= Husband reports she is doing better with her HEP and does better exercising her her son. He states no questions at this time.    Time 4    Period Weeks    Status Partially Met    Target Date 11/12/21               PT Long Term Goals - 10/01/21 1440        PT LONG TERM GOAL #1   Title Patient will increase FOTO score to equal to or greater than 44 to demonstrate statistically significant improvement in mobility and quality of life.    Baseline 10/6: 26, 11/8: 12%, 1/18: 12%    Time 12    Period Weeks    Status Not Met    Target Date 11/14/21  PT LONG TERM GOAL #2   Title Patient will roll L and R in bed with mod A for decreased caregiver burden for ADLs and toileting.    Baseline 10/6: per pt spouse, pt currently dependent, 11/8: mod A, inconsistent, 1/18: needs min A; 10/01/2021=Min//mod assist to right and back to left- Max VC for correct technique. Patient required physical assist with rolling especially moving her legs- she was able to reach and roll her upper torso well with min assist.    Time 12    Period Weeks    Status Partially Met    Target Date 11/14/21      PT LONG TERM GOAL #3   Title Patient will sit upright in WC, with BUE support and without leaning on backrest for 10 minutes for ADL performance and pressure relief.    Baseline 10/6: pt exhibits core contraction but unable to sit upright, 11/8: able to sit upright for <20 sec with mod VCs for erect positioning, 1/18: able to hold for 60 sec with mod VCs. 10/01/2021=Patient was then assessed sitting at [facility]- without back support- unable to maintain > 30 sec without assistance.    Time 12    Period Weeks    Status Partially Met    Target Date 11/14/21      PT LONG TERM GOAL #4   Title Pt will demonstrate ability to utilize BUEs to weight-shift to each side in WC and maintain weight shift for 30 seconds to assist in pressure relief and reduce risk of wound formation.    Baseline 10/6: pt currently unable, 11/8: unable to initiate weight shift, able to use UE but unable to shift hips without assistance; 1/18: requires max cues and min A for weight shift    Time 12    Period Weeks    Status Partially Met    Target Date 11/14/21                   Plan -  10/03/21 1407     Clinical Impression Statement Pt very engaged in session and motivated to participate on this date. PT provided further instruction in trunk/core strengthening in chair to promote ease with pressure relief techniques. Pt was able to successfully mirror PT with sequencing with lateral pressure relief technique, however, pt still has insufficient strength to perform intervention effectively. Pt did show improvement with muscle activation with all exercises performed following cuing and repitition. The pt will benefit from further skilled PT to continue to improve strength, mobility and activity tolerance in order to increase ease and safety with ADLs.    Personal Factors and Comorbidities Age;Time since onset of injury/illness/exacerbation;Comorbidity 3+;Past/Current Experience;Education;Social Background;Transportation;Sex;Fitness    Comorbidities diastolic congestive heart failure, diabetes mellitus, diabetic neuropathy, hypertension, hyperlipidemia, GERD, peripheral vascular disease, CVA. on 9/2 had amputation of L foot second toe MTP joint and LLE revascularization, recent hx of COVID19    Examination-Activity Limitations Bathing;Hygiene/Grooming;Squat;Bed Mobility;Stairs;Lift;Bend;Locomotion Level;Stand;Caring for [employer];Toileting;Carry;Self Feeding;Transfers;Continence;Dressing;Other;Sit    Examination-Participation Restrictions Cleaning;Shop;Community Activity;Meal Prep;Driving;Other    Stability/Clinical Decision Making Evolving/Moderate complexity    Rehab Potential Poor    PT Frequency 2x / week    PT Duration 12 weeks    PT Treatment/Interventions ADLs/Self Care Home Management;DME Instruction;Functional mobility training;Therapeutic activities;Therapeutic exercise;Neuromuscular re-education;Patient/family education;Wheelchair mobility training;Passive range of motion;Energy conservation;Cryotherapy;Biofeedback;Electrical Stimulation;Iontophoresis 43m/ml  Dexamethasone;Moist Heat;Traction;Ultrasound;Stair training;Cognitive remediation;Balance training;Orthotic Fit/Training;Manual techniques;Manual lymph drainage;Splinting;Taping;Vasopneumatic Device;Vestibular;Visual/perceptual remediation/compensation;Gait training    PT Next Visit Plan bed mobility, pressure relief techniques; Progressive Therex for strengthening; Transer training;  pregait activities. Continue POC as previously indicated    PT Home Exercise Plan Previous- Seated lateral weight shift pressure relief technique in wheelchair to be performed 5 days/week for 2 sets of 10 repetitions each side.  PT provided patient with picture of technique as well.  05/21/2021- Access Code: [email or web address];Access Code: [email or web address]    Consulted and Agree with Plan of Care Family member/caregiver    Family Member Consulted Spouse             Patient will benefit from skilled therapeutic intervention in order to improve the following deficits and impairments:  Decreased cognition, Decreased skin integrity, Impaired sensation, Decreased mobility, Postural dysfunction, Decreased activity tolerance, Decreased endurance, Decreased range of motion, Decreased strength, Decreased balance, Decreased safety awareness, Increased edema, Impaired flexibility, Decreased coordination, Cardiopulmonary status limiting activity, Abnormal gait, Impaired UE functional use, Impaired tone, Improper body mechanics, Pain, Difficulty walking, Decreased knowledge of use of DME, Hypomobility, Impaired perceived functional ability  Visit Diagnosis: Muscle weakness (generalized)  Other abnormalities of gait and mobility  Other lack of coordination     Problem List Patient Active Problem List   Diagnosis Date Noted   Atherosclerosis of native arteries of extremity with rest pain ([place]) 04/20/2021   COVID-19 virus infection 03/07/2021   Acute febrile illness 03/07/2021   Hypoalbuminemia 03/07/2021   Hyperglycemia due to diabetes  mellitus ([place]) 03/07/2021   Elevated troponin 03/07/2021   Constipation 03/07/2021   COPD (chronic obstructive pulmonary disease) ([place]) 10/20/2020   GERD (gastroesophageal reflux disease) 10/20/2020   Physical deconditioning 10/20/2020   Cellulitis 10/19/2020   Necrotic toes (HCC)    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)    Diabetic neuropathy (HCC)    Gangrene of toe of both feet ([place]) 06/06/2019   Peripheral arterial disease ([place]) 04/27/2019   ESBL (extended spectrum beta-lactamase) producing bacteria infection 12/25/2018   PVD (peripheral vascular disease) ([place]) 09/12/2016   Ulcer of right lower extremity ([place]) 08/29/2016   Acute on chronic diastolic heart failure ([place]) 07/08/2016   HTN (hypertension) 07/05/2016   Sensorineural hearing loss (SNHL), 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   Chronic diastolic CHF (congestive heart failure) ([place]) 03/10/2016   HLD (hyperlipidemia) 03/09/2016   Acute respiratory acidosis (HCC) 03/05/2016   Anemia, iron deficiency 03/03/2016   Cerebrovascular accident (CVA) due to thrombosis of precerebral artery ([place]) 03/01/2016   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 ([place]) 03/01/2016   Atherosclerosis of native arteries of the extremities with gangrene ([place]) 12/08/2015   Anemia, chronic disease 04/27/2014   Unspecified hereditary and idiopathic peripheral neuropathy 03/14/2014   DM2 (diabetes mellitus, type 2) ([place]) 02/14/2014   Aortic stenosis 02/04/2014   Closed left subtrochanteric femur fracture ([place]) 02/03/2014   Hip fracture, left ([place]) 02/03/2014   Fibula fracture 02/03/2014   MTP instability 02/03/2014   Hip fracture ([place]) 02/03/2014    Zollie Pee, PT 10/03/2021, 2:19 PM  [facility] [place] [facility]IN [facility] SERVICES [address] [place], [place], [address] Phone: [phone]   Fax:  [phone]  Name: [name] MRN: [id number] Date of Birth: May 23, 1941

## 2021-10-09 ENCOUNTER — Ambulatory Visit: Payer: Medicare HMO | Admitting: Physical Therapy

## 2021-10-11 ENCOUNTER — Other Ambulatory Visit: Payer: Self-pay

## 2021-10-11 ENCOUNTER — Ambulatory Visit: Payer: Medicare HMO | Admitting: Physical Therapy

## 2021-10-11 ENCOUNTER — Encounter: Payer: Self-pay | Admitting: Physical Therapy

## 2021-10-11 DIAGNOSIS — R278 Other lack of coordination: Secondary | ICD-10-CM

## 2021-10-11 DIAGNOSIS — R262 Difficulty in walking, not elsewhere classified: Secondary | ICD-10-CM

## 2021-10-11 DIAGNOSIS — R293 Abnormal posture: Secondary | ICD-10-CM

## 2021-10-11 DIAGNOSIS — M6281 Muscle weakness (generalized): Secondary | ICD-10-CM

## 2021-10-11 DIAGNOSIS — R2681 Unsteadiness on feet: Secondary | ICD-10-CM

## 2021-10-11 DIAGNOSIS — R2689 Other abnormalities of gait and mobility: Secondary | ICD-10-CM

## 2021-10-11 DIAGNOSIS — R269 Unspecified abnormalities of gait and mobility: Secondary | ICD-10-CM

## 2021-10-11 NOTE — Therapy (Signed)
[place] MAIN [facility] SERVICES [address] [place], [place], [address] Phone: [phone]   Fax:  [phone]  Physical Therapy Treatment  Patient Details  Name: [name] MRN: [id number] Date of Birth: 1941/02/24 Referring Provider (PT): Sharin Grave   Encounter Date: 10/11/2021   PT End of Session - 10/11/21 1437     Visit Number 32    Number of Visits 56    Date for PT Re-Evaluation 11/14/21    Authorization Type [employer]; Medicaid    Authorization Time Period 1/18-3/31, 24 visits    Authorization - Visit Number 1    Authorization - Number of Visits 24    PT Start Time 1432    PT Stop Time 1515    PT Time Calculation (min) 43 min    Equipment Utilized During Treatment Gait belt    Activity Tolerance Patient limited by fatigue    Behavior During Therapy Flat affect             Past Medical History:  Diagnosis Date   Acute heart failure ([place])    Aortic stenosis    CHF (congestive heart failure) ([place])    Complication of anesthesia    Hard to wake patient up after having anesthesia   Diabetes mellitus without complication ([place])    Diabetic neuropathy ([place])    Takes Lyrica   Edema of both legs    Takes Lasix   GERD (gastroesophageal reflux disease)    High cholesterol    HTN (hypertension)    Hypertension    PAD (peripheral artery disease) ([place])    Shortness of breath dyspnea    Spasm of back muscles    Stroke ([place])    Wound, open    Right great toe    Past Surgical History:  Procedure Laterality Date   AMPUTATION TOE Left 06/09/2019   Procedure: Left third toe and partial great toe amputation and debridement;  Surgeon: Katha Cabal, MD;  Location: [facility];  Service: Vascular;  Laterality: Left;   AMPUTATION TOE Left 04/06/2020   Procedure: AMPUTATION LEFT SECOND TOE;  Surgeon: Samara Deist, DPM;  Location: [facility];  Service: Podiatry;  Laterality: Left;   BYPASS GRAFT POPLITEAL TO TIBIAL Right  02/28/2016   Procedure: BYPASS GRAFT RIGHT BELOW KNEE POPLITEAL TO PERONEAL USING REVERSED RIGHT GREATER SAPHENOUS VEIN;  Surgeon: Conrad [place], MD;  Location: [place];  Service: Vascular;  Laterality: Right;   CYST EXCISION     Abdomen   EYE SURGERY Bilateral    Cataract removal   INTRAMEDULLARY (IM) NAIL INTERTROCHANTERIC Left 02/04/2014   Procedure: INTRAMEDULLARY (IM) NAIL INTERTROCHANTRIC FEMORAL;  Surgeon: Mauri Pole, MD;  Location: [place];  Service: Orthopedics;  Laterality: Left;   IR GENERIC HISTORICAL  03/01/2016   IR ANGIO INTRA EXTRACRAN SEL COM CAROTID INNOMINATE UNI R MOD SED 03/01/2016 [name], MD [facility]   IR GENERIC HISTORICAL  03/01/2016   IR ENDOVASC INTRACRANIAL INF OTHER THAN THROMBO ART INC DIAG ANGIO 03/01/2016 [name], MD [facility]   IR GENERIC HISTORICAL  03/01/2016   IR INTRAVSC STENT CERV CAROTID W/O EMB-PROT MOD SED INC ANGIO 03/01/2016 [name], MD [facility]   IR GENERIC HISTORICAL  06/03/2016   IR RADIOLOGIST EVAL & MGMT 06/03/2016 [facility]   LOWER EXTREMITY ANGIOGRAPHY Left 02/09/2019   Procedure: LOWER EXTREMITY ANGIOGRAPHY;  Surgeon: Katha Cabal, MD;  Location: [place] [facility];  Service: Cardiovascular;  Laterality:  Left;   LOWER EXTREMITY ANGIOGRAPHY Left 03/10/2019   Procedure: LOWER EXTREMITY ANGIOGRAPHY;  Surgeon: Katha Cabal, MD;  Location: [place] [facility];  Service: Cardiovascular;  Laterality: Left;   LOWER EXTREMITY ANGIOGRAPHY Left 04/27/2019   Procedure: LOWER EXTREMITY ANGIOGRAPHY;  Surgeon: Katha Cabal, MD;  Location: [place] [facility];  Service: Cardiovascular;  Laterality: Left;   LOWER EXTREMITY ANGIOGRAPHY Left 06/08/2019   Procedure: Lower Extremity Angiography;  Surgeon: Katha Cabal, MD;  Location: [place] [facility];  Service: Cardiovascular;  Laterality: Left;   LOWER EXTREMITY ANGIOGRAPHY Left 04/03/2020   Procedure: Lower Extremity Angiography;  Surgeon: Algernon Huxley, MD;  Location: [place] [facility];  Service: Cardiovascular;  Laterality: Left;   LOWER EXTREMITY ANGIOGRAPHY Right 10/20/2020   Procedure: Lower Extremity Angiography;  Surgeon: Katha Cabal, MD;  Location: [place] [facility];  Service: Cardiovascular;  Laterality: Right;   LOWER EXTREMITY ANGIOGRAPHY Left 01/23/2021   Procedure: LOWER EXTREMITY ANGIOGRAPHY;  Surgeon: Katha Cabal, MD;  Location: [place] [facility];  Service: Cardiovascular;  Laterality: Left;   LOWER EXTREMITY ANGIOGRAPHY Right 04/10/2021   Procedure: LOWER EXTREMITY ANGIOGRAPHY;  Surgeon: Katha Cabal, MD;  Location: [place] [facility];  Service: Cardiovascular;  Laterality: Right;   ORIF TOE FRACTURE Right 02/08/2014   Procedure: OPEN REDUCTION INTERNAL FIXATION Right METATARSAL  FRACTURE ;  Surgeon: Wylene Simmer, MD;  Location: [place];  Service: Orthopedics;  Laterality: Right;   PERIPHERAL VASCULAR CATHETERIZATION N/A 12/28/2015   Procedure: Abdominal Aortogram w/Lower Extremity;  Surgeon: Conrad Wilber, MD;  Location: [place] [facility];  Service: Cardiovascular;  Laterality: N/A;   RADIOLOGY WITH ANESTHESIA N/A 03/01/2016   Procedure: RADIOLOGY WITH ANESTHESIA;  Surgeon: [name], MD;  Location: [place];  Service: Radiology;  Laterality: N/A;   VEIN HARVEST Right 02/28/2016   Procedure: RIGHT GREATER SAPHENOUS VEIN HARVEST;  Surgeon: Conrad [place], MD;  Location: [place];  Service: Vascular;  Laterality: Right;    There were no vitals filed for this visit.   Subjective Assessment - 10/11/21 1435     Subjective Patient reports doing okay. Spouse states that his surgery was cancelled and rescheduled for 3/27.    Patient is accompained by: Family member    Pertinent History 52yoF presenting to [place] to address recent gradual progressive decline in mobility, family goals for improved strength, to promote > participation in transfers to wheelchair and safety with ADL performance. Recent  illness includes admission to [facility] on 03/07/21-03/10/21 due to COVID infection c AMS and fever. Pt then underwent outpatient recanalization/limb salvage of RLE 04/10/2021; LLE salvage performed in June 2022. Other PMH: CHF, DM, HTN, CVA, COPD, multiple remote toe amputations, stg 2 sacral ulcer. PLOF includes 3-4 years bed bound status, hoyerlifts to WC, totalA for ADL performance. Pt is cared for by family, as well as professional caregiver services.    Limitations Sitting;Walking;Lifting;Standing;House hold activities    How long can you sit comfortably? Pt spouse reports 2 hours. Must be careful d/t preventing and trying to heal wounds on bottom.    How long can you stand comfortably? unable    How long can you walk comfortably? Unable    Diagnostic tests 6/21 and 9/06 peripheral vascular catheterizatoin; 04/10/2021 Successful recanalization right lower extremity for limb salvage    Patient Stated Goals to strengthen pt's trunk to improve mobility in chair, assist with ADLs    Currently in Pain? Yes  Pain Score --   reports hurting a lot in her legs   Pain Onset More than a month ago                   TREATMENT:   TherEx- Sitting   Assisted hip march BLE x 10 reps x2 set with therapist providing AAROM and cues for forward weight shift to challenge trunk control with hip flexion;   Sitting in chair holding small ball, reaching forward and tapping ball on parallel bars x10 reps with therapist providing min A and mod VCS for using core activation and to increase trunk activation; Patient reports moderate fatigue after exercise;   TherAct Instructed patient in forward weight shift in chair working on improving erect posture  Instructed patient in sit<>Stand at parallel bars: Required max A +2 for sit<>Stand with cues for forward weight shift and to increase push through LE;  Upon standing, required cues for hip extension and better trunk control. Pt exhibits heavily flexed posture with  poor hip extension. She requests to sit quickly stating, "My booty hurts."  Patient unable to clarify if bottom really hurts or if she is just working muscles she isn't used to working;  Patient was dependent to scoot back in chair and reposition for comfort;  Pt educated throughout session about proper posture and technique with exercises. Improved exercise technique, movement at target joints, use of target muscles after min to mod verbal, visual, tactile cues.    Reinforced importance of working on bed exercise as well as sitting and reaching forward in chair to challenge trunk control and positioning;                      PT Education - 10/11/21 1437     Education Details exercise technique/positioning;    Person(s) Educated Patient;Spouse    Methods Explanation;Verbal cues    Comprehension Verbalized understanding;Returned demonstration;Verbal cues required;Need further instruction              PT Short Term Goals - 10/01/21 1439       PT SHORT TERM GOAL #1   Title Patient will be independent in home exercise program to improve strength/mobility for better functional independence with ADLs.    Baseline 10/6: to be initiated, 11/8: caregiver reports she is doing them 3x a day; 1/18: doesn't do much exercise; 10/01/2021= Husband reports she is doing better with her HEP and does better exercising her her son. He states no questions at this time.    Time 4    Period Weeks    Status Partially Met    Target Date 11/12/21               PT Long Term Goals - 10/01/21 1440       PT LONG TERM GOAL #1   Title Patient will increase FOTO score to equal to or greater than 44 to demonstrate statistically significant improvement in mobility and quality of life.    Baseline 10/6: 26, 11/8: 12%, 1/18: 12%    Time 12    Period Weeks    Status Not Met    Target Date 11/14/21      PT LONG TERM GOAL #2   Title Patient will roll L and R in bed with mod A for  decreased caregiver burden for ADLs and toileting.    Baseline 10/6: per pt spouse, pt currently dependent, 11/8: mod A, inconsistent, 1/18: needs min A; 10/01/2021=Min//mod assist to right and  back to left- Max VC for correct technique. Patient required physical assist with rolling especially moving her legs- she was able to reach and roll her upper torso well with min assist.    Time 12    Period Weeks    Status Partially Met    Target Date 11/14/21      PT LONG TERM GOAL #3   Title Patient will sit upright in WC, with BUE support and without leaning on backrest for 10 minutes for ADL performance and pressure relief.    Baseline 10/6: pt exhibits core contraction but unable to sit upright, 11/8: able to sit upright for <20 sec with mod VCs for erect positioning, 1/18: able to hold for 60 sec with mod VCs. 10/01/2021=Patient was then assessed sitting at [facility]- without back support- unable to maintain > 30 sec without assistance.    Time 12    Period Weeks    Status Partially Met    Target Date 11/14/21      PT LONG TERM GOAL #4   Title Pt will demonstrate ability to utilize BUEs to weight-shift to each side in WC and maintain weight shift for 30 seconds to assist in pressure relief and reduce risk of wound formation.    Baseline 10/6: pt currently unable, 11/8: unable to initiate weight shift, able to use UE but unable to shift hips without assistance; 1/18: requires max cues and min A for weight shift    Time 12    Period Weeks    Status Partially Met    Target Date 11/14/21                   Plan - 10/11/21 1518     Clinical Impression Statement Patient motivated and participated fair within session. She does report increased soreness in legs today which limited active movement in LE. She was instructed in seated exercise to facilitate ROM including hip flexion march. Progressed exercise with patient being encouraged to sit forward in chair and avoid posterior lean;She required mod  A for positioning and assistance for lifting LE. Patient instructed in sit<>Stand at parallel bars. She required cues for forward weight shift. Patient able to lean forward, required max A +2 for sit<>stand with decreased hip extensor activation. She was able to stand for approximately 10 sec each rep, requesting to sit, stating, "my booty hurts." Patient and caregiver educated in [place] with instruction to increase ROM in bed and work on forward weight shift in chair. she would benefit from additional skilled PT Intervention to improve strength, balance and mobility;    Personal Factors and Comorbidities Age;Time since onset of injury/illness/exacerbation;Comorbidity 3+;Past/Current Experience;Education;Social Background;Transportation;Sex;Fitness    Comorbidities diastolic congestive heart failure, diabetes mellitus, diabetic neuropathy, hypertension, hyperlipidemia, GERD, peripheral vascular disease, CVA. on 9/2 had amputation of L foot second toe MTP joint and LLE revascularization, recent hx of COVID19    Examination-Activity Limitations Bathing;Hygiene/Grooming;Squat;Bed Mobility;Stairs;Lift;Bend;Locomotion Level;Stand;Caring for [employer];Toileting;Carry;Self Feeding;Transfers;Continence;Dressing;Other;Sit    Examination-Participation Restrictions Cleaning;Shop;Community Activity;Meal Prep;Driving;Other    Stability/Clinical Decision Making Evolving/Moderate complexity    Rehab Potential Poor    PT Frequency 2x / week    PT Duration 12 weeks    PT Treatment/Interventions ADLs/Self Care Home Management;DME Instruction;Functional mobility training;Therapeutic activities;Therapeutic exercise;Neuromuscular re-education;Patient/family education;Wheelchair mobility training;Passive range of motion;Energy conservation;Cryotherapy;Biofeedback;Electrical Stimulation;Iontophoresis 3m/ml Dexamethasone;Moist Heat;Traction;Ultrasound;Stair training;Cognitive remediation;Balance training;Orthotic  Fit/Training;Manual techniques;Manual lymph drainage;Splinting;Taping;Vasopneumatic Device;Vestibular;Visual/perceptual remediation/compensation;Gait training    PT Next Visit Plan bed mobility, pressure relief techniques; Progressive Therex for strengthening; Transer training; pregait activities.  Continue POC as previously indicated    PT Home Exercise Plan Previous- Seated lateral weight shift pressure relief technique in wheelchair to be performed 5 days/week for 2 sets of 10 repetitions each side.  PT provided patient with picture of technique as well.  05/21/2021- Access Code: [email or web address];Access Code: [email or web address]    Consulted and Agree with Plan of Care Family member/caregiver    Family Member Consulted Spouse             Patient will benefit from skilled therapeutic intervention in order to improve the following deficits and impairments:  Decreased cognition, Decreased skin integrity, Impaired sensation, Decreased mobility, Postural dysfunction, Decreased activity tolerance, Decreased endurance, Decreased range of motion, Decreased strength, Decreased balance, Decreased safety awareness, Increased edema, Impaired flexibility, Decreased coordination, Cardiopulmonary status limiting activity, Abnormal gait, Impaired UE functional use, Impaired tone, Improper body mechanics, Pain, Difficulty walking, Decreased knowledge of use of DME, Hypomobility, Impaired perceived functional ability  Visit Diagnosis: Muscle weakness (generalized)  Other abnormalities of gait and mobility  Other lack of coordination  Difficulty in walking, not elsewhere classified  Abnormality of gait and mobility  Unsteadiness on feet  Abnormal posture     Problem List Patient Active Problem List   Diagnosis Date Noted   Atherosclerosis of native arteries of extremity with rest pain ([place]) 04/20/2021   COVID-19 virus infection 03/07/2021   Acute febrile illness 03/07/2021   Hypoalbuminemia 03/07/2021    Hyperglycemia due to diabetes mellitus ([place]) 03/07/2021   Elevated troponin 03/07/2021   Constipation 03/07/2021   COPD (chronic obstructive pulmonary disease) ([place]) 10/20/2020   GERD (gastroesophageal reflux disease) 10/20/2020   Physical deconditioning 10/20/2020   Cellulitis 10/19/2020   Necrotic toes (HCC)    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)    Diabetic neuropathy (HCC)    Gangrene of toe of both feet ([place]) 06/06/2019   Peripheral arterial disease ([place]) 04/27/2019   ESBL (extended spectrum beta-lactamase) producing bacteria infection 12/25/2018   PVD (peripheral vascular disease) ([place]) 09/12/2016   Ulcer of right lower extremity ([place]) 08/29/2016   Acute on chronic diastolic heart failure ([place]) 07/08/2016   HTN (hypertension) 07/05/2016   Sensorineural hearing loss (SNHL), 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   Chronic diastolic CHF (congestive heart failure) ([place]) 03/10/2016   HLD (hyperlipidemia) 03/09/2016   Acute respiratory acidosis (HCC) 03/05/2016   Anemia, iron deficiency 03/03/2016   Cerebrovascular accident (CVA) due to thrombosis of precerebral artery ([place]) 03/01/2016   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 ([place]) 03/01/2016   Atherosclerosis of native arteries of the extremities with gangrene ([place]) 12/08/2015   Anemia, chronic disease 04/27/2014   Unspecified hereditary and idiopathic peripheral neuropathy 03/14/2014   DM2 (diabetes mellitus, type 2) ([place]) 02/14/2014   Aortic stenosis 02/04/2014   Closed left subtrochanteric femur fracture ([place]) 02/03/2014   Hip fracture, left ([place]) 02/03/2014   Fibula fracture 02/03/2014   MTP instability 02/03/2014   Hip fracture ([place]) 02/03/2014    Odean Mcelwain, PT, DPT 10/11/2021, 3:22 PM   [facility] MAIN [facility] SERVICES [address] [place], [place], [address] Phone: [phone]   Fax:  [phone]  Name: [name] MRN: [id number] Date of Birth:  01/02/1941

## 2021-10-16 ENCOUNTER — Encounter: Payer: Self-pay | Admitting: Physical Therapy

## 2021-10-16 ENCOUNTER — Other Ambulatory Visit: Payer: Self-pay

## 2021-10-16 ENCOUNTER — Ambulatory Visit: Payer: Medicare HMO | Admitting: Physical Therapy

## 2021-10-16 DIAGNOSIS — R262 Difficulty in walking, not elsewhere classified: Secondary | ICD-10-CM

## 2021-10-16 DIAGNOSIS — R293 Abnormal posture: Secondary | ICD-10-CM

## 2021-10-16 DIAGNOSIS — R2689 Other abnormalities of gait and mobility: Secondary | ICD-10-CM

## 2021-10-16 DIAGNOSIS — M6281 Muscle weakness (generalized): Secondary | ICD-10-CM | POA: Diagnosis not present

## 2021-10-16 DIAGNOSIS — R278 Other lack of coordination: Secondary | ICD-10-CM

## 2021-10-16 DIAGNOSIS — R269 Unspecified abnormalities of gait and mobility: Secondary | ICD-10-CM

## 2021-10-16 DIAGNOSIS — R2681 Unsteadiness on feet: Secondary | ICD-10-CM

## 2021-10-16 NOTE — Therapy (Signed)
[place] [facility] ?[place], [place], [address] ?Phone: [phone]   Fax:  [phone] ? ?Physical Therapy Treatment ? ?Patient Details  ?Name: [name] ?MRN: [id number] ?Date of Birth: 01/02/1941 ?Referring Provider (PT): Adamo, Elana M ? ? ?Encounter Date: 10/16/2021 ? ? PT End of Session - 10/16/21 1443   ? ? Visit Number 33   ? Number of Visits 49   ? Date for PT Re-Evaluation 11/14/21   ? Authorization Type [employer] Medicare; Medicaid   ? Authorization Time Period 1/18-3/31, 24 visits   ? Authorization - Visit Number 1   ? Authorization - Number of Visits 24   ? PT Start Time 1435   ? PT Stop Time 1515   ? PT Time Calculation (min) 40 min   ? Equipment Utilized During Treatment Gait belt   ? Activity Tolerance Patient limited by fatigue   ? Behavior During Therapy Flat affect   ? ?  ?  ? ?  ? ? ?Past Medical History:  ?Diagnosis Date  ? Acute heart failure ([place])   ? Aortic stenosis   ? CHF (congestive heart failure) ([place])   ? Complication of anesthesia   ? Hard to wake patient up after having anesthesia  ? Diabetes mellitus without complication ([place])   ? Diabetic neuropathy ([place])   ? Takes Lyrica  ? Edema of both legs   ? Takes Lasix  ? GERD (gastroesophageal reflux disease)   ? High cholesterol   ? HTN (hypertension)   ? Hypertension   ? PAD (peripheral artery disease) ([place])   ? Shortness of breath dyspnea   ? Spasm of back muscles   ? Stroke [facility])   ? Wound, open   ? Right great toe  ? ? ?Past Surgical History:  ?Procedure Laterality Date  ? AMPUTATION TOE Left 06/09/2019  ? Procedure: Left third toe and partial great toe amputation and debridement;  Surgeon: Katha Cabal, MD;  Location: [facility];  Service: Vascular;  Laterality: Left;  ? AMPUTATION TOE Left 04/06/2020  ? Procedure: AMPUTATION LEFT SECOND TOE;  Surgeon: Samara Deist, DPM;  Location: [facility];  Service: Podiatry;  Laterality: Left;  ? BYPASS GRAFT POPLITEAL TO TIBIAL Right  02/28/2016  ? Procedure: BYPASS GRAFT RIGHT BELOW KNEE POPLITEAL TO PERONEAL USING REVERSED RIGHT GREATER SAPHENOUS VEIN;  Surgeon: Conrad [place], MD;  Location: [place];  Service: Vascular;  Laterality: Right;  ? CYST EXCISION    ? Abdomen  ? EYE SURGERY Bilateral   ? Cataract removal  ? INTRAMEDULLARY (IM) NAIL INTERTROCHANTERIC Left 02/04/2014  ? Procedure: INTRAMEDULLARY (IM) NAIL INTERTROCHANTRIC FEMORAL;  Surgeon: Mauri Pole, MD;  Location: [place];  Service: Orthopedics;  Laterality: Left;  ? IR GENERIC HISTORICAL  03/01/2016  ? IR ANGIO INTRA EXTRACRAN SEL COM CAROTID INNOMINATE UNI R MOD SED 03/01/2016 [name], MD [facility]  ? IR GENERIC HISTORICAL  03/01/2016  ? IR ENDOVASC INTRACRANIAL INF OTHER THAN THROMBO ART INC DIAG ANGIO 03/01/2016 [name], MD [facility]  ? IR GENERIC HISTORICAL  03/01/2016  ? IR INTRAVSC STENT CERV CAROTID W/O EMB-PROT MOD SED INC ANGIO 03/01/2016 [name], MD [facility]  ? IR GENERIC HISTORICAL  06/03/2016  ? IR RADIOLOGIST EVAL & MGMT 06/03/2016 [facility]  ? LOWER EXTREMITY ANGIOGRAPHY Left 02/09/2019  ? Procedure: LOWER EXTREMITY ANGIOGRAPHY;  Surgeon: Katha Cabal, MD;  Location: [place] [facility];  Service: Cardiovascular;  Laterality:  Left;  ? LOWER EXTREMITY ANGIOGRAPHY Left 03/10/2019  ? Procedure: LOWER EXTREMITY ANGIOGRAPHY;  Surgeon: Katha Cabal, MD;  Location: [place] [facility];  Service: Cardiovascular;  Laterality: Left;  ? LOWER EXTREMITY ANGIOGRAPHY Left 04/27/2019  ? Procedure: LOWER EXTREMITY ANGIOGRAPHY;  Surgeon: Katha Cabal, MD;  Location: [place] [facility];  Service: Cardiovascular;  Laterality: Left;  ? LOWER EXTREMITY ANGIOGRAPHY Left 06/08/2019  ? Procedure: Lower Extremity Angiography;  Surgeon: Katha Cabal, MD;  Location: [place];  Service: Cardiovascular;  Laterality: Left;  ? LOWER EXTREMITY ANGIOGRAPHY Left 04/03/2020  ? Procedure: Lower Extremity Angiography;  Surgeon: Algernon Huxley, MD;  Location: [facility] [facility];  Service: Cardiovascular;  Laterality: Left;  ? LOWER EXTREMITY ANGIOGRAPHY Right 10/20/2020  ? Procedure: Lower Extremity Angiography;  Surgeon: Katha Cabal, MD;  Location: [place] [facility];  Service: Cardiovascular;  Laterality: Right;  ? LOWER EXTREMITY ANGIOGRAPHY Left 01/23/2021  ? Procedure: LOWER EXTREMITY ANGIOGRAPHY;  Surgeon: Katha Cabal, MD;  Location: [place] [facility];  Service: Cardiovascular;  Laterality: Left;  ? LOWER EXTREMITY ANGIOGRAPHY Right 04/10/2021  ? Procedure: LOWER EXTREMITY ANGIOGRAPHY;  Surgeon: Katha Cabal, MD;  Location: [place] [facility];  Service: Cardiovascular;  Laterality: Right;  ? ORIF TOE FRACTURE Right 02/08/2014  ? Procedure: OPEN REDUCTION INTERNAL FIXATION Right METATARSAL  FRACTURE ;  Surgeon: Wylene Simmer, MD;  Location: [place];  Service: Orthopedics;  Laterality: Right;  ? PERIPHERAL VASCULAR CATHETERIZATION N/A 12/28/2015  ? Procedure: Abdominal Aortogram w/Lower Extremity;  Surgeon: Conrad [place], MD;  Location: [place] [facility];  Service: Cardiovascular;  Laterality: N/A;  ? RADIOLOGY WITH ANESTHESIA N/A 03/01/2016  ? Procedure: RADIOLOGY WITH ANESTHESIA;  Surgeon: [name], MD;  Location: [place];  Service: Radiology;  Laterality: N/A;  ? VEIN HARVEST Right 02/28/2016  ? Procedure: RIGHT GREATER SAPHENOUS VEIN HARVEST;  Surgeon: Conrad [place], MD;  Location: [place];  Service: Vascular;  Laterality: Right;  ? ? ?There were no vitals filed for this visit. ? ? Subjective Assessment - 10/16/21 1441   ? ? Subjective Patient reports her booty and back are hurting. She also reports her right shoulder bothers her sometimes.   ? Patient is accompained by: Family member   ? Pertinent History 80yoF presenting to [place] to address recent gradual progressive decline in mobility, family goals for improved strength, to promote > participation in transfers to wheelchair and safety with ADL performance.  Recent illness includes admission to [facility] on 03/07/21-03/10/21 due to COVID infection c AMS and fever. Pt then underwent outpatient recanalization/limb salvage of RLE 04/10/2021; LLE salvage performed in June 2022. Other PMH: CHF, DM, HTN, CVA, COPD, multiple remote toe amputations, stg 2 sacral ulcer. PLOF includes 3-4 years bed bound status, hoyerlifts to WC, totalA for ADL performance. Pt is cared for by family, as well as professional caregiver services.   ? Limitations Sitting;Walking;Lifting;Standing;House hold activities   ? How long can you sit comfortably? Pt spouse reports 2 hours. Must be careful d/t preventing and trying to heal wounds on bottom.   ? How long can you stand comfortably? unable   ? How long can you walk comfortably? Unable   ? Diagnostic tests 6/21 and 9/06 peripheral vascular catheterizatoin; 04/10/2021 Successful recanalization right lower extremity for limb salvage   ? Patient Stated Goals to strengthen pt's trunk to improve mobility in chair, assist with ADLs   ? Currently in Pain? Yes   ?  Pain Score --   unable to rate, states that her booty and back hurt a lot  ? Pain Onset More than a month ago   ? ?  ?  ? ?  ? ? ? ? ? ? ? ? ? ?  ?  ?TREATMENT: ?  ?TherEx- ?Sitting  ? Assisted hip march toe taps from floor to 6 inch step x5 reps each LE, patient does require min A but was able to exhibit better muscle activation compared to previous sessions; She seems to do better with visual cues and targets with improved participation and muscle activation.  ?  ?Sitting in chair holding small ball, reaching forward aiming to touch ball to step, unable to reach step ? Progressed to trunk rotation reaching ball to arm rest of steps x10 reps each direction with mod VCs to avoid posterior lean and increase trunk rotation;  ?  ?TherAct ?Instructed patient in forward weight shift in chair working on improving erect posture ?  ?Instructed patient in sit<>Stand at steps ?Required max A +2 for sit<>Stand with  cues for forward weight shift and to increase push through LE; ?            Upon standing, required cues for hip extension and better trunk control. Patient was able to come up with improved knee extension and f

## 2021-10-18 ENCOUNTER — Other Ambulatory Visit: Payer: Self-pay

## 2021-10-18 ENCOUNTER — Ambulatory Visit: Payer: Medicare HMO

## 2021-10-18 DIAGNOSIS — M6281 Muscle weakness (generalized): Secondary | ICD-10-CM | POA: Diagnosis not present

## 2021-10-18 NOTE — Therapy (Signed)
[place] ?[facility] ?[place], [place], [address] ?Phone: [phone]   Fax:  [phone] ? ?Physical Therapy Treatment ? ?Patient Details  ?Name: [name] ?MRN: [id number] ?Date of Birth: Jul 09, 1941 ?Referring Provider (PT): Adamo, Elana M ? ? ?Encounter Date: 10/18/2021 ? ? PT End of Session - 10/18/21 1528   ? ? Visit Number 34   ? Number of Visits 49   ? Date for PT Re-Evaluation 11/14/21   ? Authorization Type [employer] Medicare; Medicaid   ? Authorization Time Period 1/18-3/31, 24 visits   ? Authorization - Visit Number 1   ? Authorization - Number of Visits 24   ? PT Start Time 1430   ? PT Stop Time 1448   ? PT Time Calculation (min) 44 min   ? Equipment Utilized During Treatment Gait belt   ? Activity Tolerance Patient limited by fatigue   ? Behavior During Therapy Flat affect   ? ?  ?  ? ?  ? ? ?Past Medical History:  ?Diagnosis Date  ? Acute heart failure ([place])   ? Aortic stenosis   ? CHF (congestive heart failure) ([place])   ? Complication of anesthesia   ? Hard to wake patient up after having anesthesia  ? Diabetes mellitus without complication ([place])   ? Diabetic neuropathy ([place])   ? Takes Lyrica  ? Edema of both legs   ? Takes Lasix  ? GERD (gastroesophageal reflux disease)   ? High cholesterol   ? HTN (hypertension)   ? Hypertension   ? PAD (peripheral artery disease) ([place])   ? Shortness of breath dyspnea   ? Spasm of back muscles   ? Stroke [facility])   ? Wound, open   ? Right great toe  ? ? ?Past Surgical History:  ?Procedure Laterality Date  ? AMPUTATION TOE Left 06/09/2019  ? Procedure: Left third toe and partial great toe amputation and debridement;  Surgeon: Katha Cabal, MD;  Location: [facility];  Service: Vascular;  Laterality: Left;  ? AMPUTATION TOE Left 04/06/2020  ? Procedure: AMPUTATION LEFT SECOND TOE;  Surgeon: Samara Deist, DPM;  Location: [facility];  Service: Podiatry;  Laterality: Left;  ? BYPASS GRAFT POPLITEAL TO TIBIAL Right  02/28/2016  ? Procedure: BYPASS GRAFT RIGHT BELOW KNEE POPLITEAL TO PERONEAL USING REVERSED RIGHT GREATER SAPHENOUS VEIN;  Surgeon: Conrad [place], MD;  Location: [place];  Service: Vascular;  Laterality: Right;  ? CYST EXCISION    ? Abdomen  ? EYE SURGERY Bilateral   ? Cataract removal  ? INTRAMEDULLARY (IM) NAIL INTERTROCHANTERIC Left 02/04/2014  ? Procedure: INTRAMEDULLARY (IM) NAIL INTERTROCHANTRIC FEMORAL;  Surgeon: Mauri Pole, MD;  Location: [place];  Service: Orthopedics;  Laterality: Left;  ? IR GENERIC HISTORICAL  03/01/2016  ? IR ANGIO INTRA EXTRACRAN SEL COM CAROTID INNOMINATE UNI R MOD SED 03/01/2016 [name], MD [facility]  ? IR GENERIC HISTORICAL  03/01/2016  ? IR ENDOVASC INTRACRANIAL INF OTHER THAN THROMBO ART INC DIAG ANGIO 03/01/2016 [name], MD [facility]  ? IR GENERIC HISTORICAL  03/01/2016  ? IR INTRAVSC STENT CERV CAROTID W/O EMB-PROT MOD SED INC ANGIO 03/01/2016 [name], MD [facility]  ? IR GENERIC HISTORICAL  06/03/2016  ? IR RADIOLOGIST EVAL & MGMT 06/03/2016 [facility]  ? LOWER EXTREMITY ANGIOGRAPHY Left 02/09/2019  ? Procedure: LOWER EXTREMITY ANGIOGRAPHY;  Surgeon: Katha Cabal, MD;  Location: [place] [facility];  Service: Cardiovascular;  Laterality:  Left;  ? LOWER EXTREMITY ANGIOGRAPHY Left 03/10/2019  ? Procedure: LOWER EXTREMITY ANGIOGRAPHY;  Surgeon: Katha Cabal, MD;  Location: [place] [facility];  Service: Cardiovascular;  Laterality: Left;  ? LOWER EXTREMITY ANGIOGRAPHY Left 04/27/2019  ? Procedure: LOWER EXTREMITY ANGIOGRAPHY;  Surgeon: Katha Cabal, MD;  Location: [place] [facility];  Service: Cardiovascular;  Laterality: Left;  ? LOWER EXTREMITY ANGIOGRAPHY Left 06/08/2019  ? Procedure: Lower Extremity Angiography;  Surgeon: Katha Cabal, MD;  Location: [place] [facility];  Service: Cardiovascular;  Laterality: Left;  ? LOWER EXTREMITY ANGIOGRAPHY Left 04/03/2020  ? Procedure: Lower Extremity Angiography;  Surgeon: Algernon Huxley, MD;  Location: [place] [facility];  Service: Cardiovascular;  Laterality: Left;  ? LOWER EXTREMITY ANGIOGRAPHY Right 10/20/2020  ? Procedure: Lower Extremity Angiography;  Surgeon: Katha Cabal, MD;  Location: [place] [facility];  Service: Cardiovascular;  Laterality: Right;  ? LOWER EXTREMITY ANGIOGRAPHY Left 01/23/2021  ? Procedure: LOWER EXTREMITY ANGIOGRAPHY;  Surgeon: Katha Cabal, MD;  Location: [place] [facility];  Service: Cardiovascular;  Laterality: Left;  ? LOWER EXTREMITY ANGIOGRAPHY Right 04/10/2021  ? Procedure: LOWER EXTREMITY ANGIOGRAPHY;  Surgeon: Katha Cabal, MD;  Location: [place] [facility];  Service: Cardiovascular;  Laterality: Right;  ? ORIF TOE FRACTURE Right 02/08/2014  ? Procedure: OPEN REDUCTION INTERNAL FIXATION Right METATARSAL  FRACTURE ;  Surgeon: Wylene Simmer, MD;  Location: [place];  Service: Orthopedics;  Laterality: Right;  ? PERIPHERAL VASCULAR CATHETERIZATION N/A 12/28/2015  ? Procedure: Abdominal Aortogram w/Lower Extremity;  Surgeon: Conrad [place], MD;  Location: [place] [facility];  Service: Cardiovascular;  Laterality: N/A;  ? RADIOLOGY WITH ANESTHESIA N/A 03/01/2016  ? Procedure: RADIOLOGY WITH ANESTHESIA;  Surgeon: [name], MD;  Location: [place];  Service: Radiology;  Laterality: N/A;  ? VEIN HARVEST Right 02/28/2016  ? Procedure: RIGHT GREATER SAPHENOUS VEIN HARVEST;  Surgeon: Conrad [place], MD;  Location: [place];  Service: Vascular;  Laterality: Right;  ? ? ?There were no vitals filed for this visit. ? ? Subjective Assessment - 10/18/21 1518   ? ? Subjective Patient reports her heel is hurting. Also states "my booty hurts"   ? Patient is accompained by: Family member   ? Pertinent History 80yoF presenting to [place] to address recent gradual progressive decline in mobility, family goals for improved strength, to promote > participation in transfers to wheelchair and safety with ADL performance. Recent illness includes admission to [facility] on  03/07/21-03/10/21 due to COVID infection c AMS and fever. Pt then underwent outpatient recanalization/limb salvage of RLE 04/10/2021; LLE salvage performed in June 2022. Other PMH: CHF, DM, HTN, CVA, COPD, multiple remote toe amputations, stg 2 sacral ulcer. PLOF includes 3-4 years bed bound status, hoyerlifts to WC, totalA for ADL performance. Pt is cared for by family, as well as professional caregiver services.   ? Limitations Sitting;Walking;Lifting;Standing;House hold activities   ? How long can you sit comfortably? Pt spouse reports 2 hours. Must be careful d/t preventing and trying to heal wounds on bottom.   ? How long can you stand comfortably? unable   ? How long can you walk comfortably? Unable   ? Diagnostic tests 6/21 and 9/06 peripheral vascular catheterizatoin; 04/10/2021 Successful recanalization right lower extremity for limb salvage   ? Patient Stated Goals to strengthen pt's trunk to improve mobility in chair, assist with ADLs   ? Currently in Pain? Yes   ? Pain Score --  won't give number  ? Pain Location Leg   ? Pain Orientation Right;Left   ? Pain Descriptors / Indicators Aching   ? Pain Type Chronic pain   ? Pain Onset More than a month ago   ? ?  ?  ? ?  ? ? ? ? ? ? ?Treat: ?In front of stairs:  ?-AAROM picking foot up and placing on step bilateral LE 6x; max cueing for sequencing required ?-Cross body reach to touch ball to railing: able to perform 10x on L without assistance, requires assistance for last inch to the R 10x  ? ?in chair:  ?-forward trunk lean holding onto chair for upright position, hold upright position away from back of chair 10 seconds x 6 trials ?-forward trunk lean holding onto walker static sit upright 5 seconds x 5 trials ?-forward trunk lean pulling on PT hands 10x ?-sit to stand x 2 person assist mod A with patient c/o of pain in bilateral feet/heels.  ?-upright posture in mirror x2 minutes; frequent cueing for sequencing and task orientation ?-washing hands with mod  assistance with towel due to discoloration and unknown substance covering hands. ? ?Feet examined due to pain in feet: noted discoloration and wound on 4th R toe, patient and patient's husband educated on need

## 2021-10-22 ENCOUNTER — Encounter (INDEPENDENT_AMBULATORY_CARE_PROVIDER_SITE_OTHER): Payer: Self-pay | Admitting: Vascular Surgery

## 2021-10-22 ENCOUNTER — Other Ambulatory Visit: Payer: Self-pay

## 2021-10-22 ENCOUNTER — Ambulatory Visit: Payer: Medicare HMO | Admitting: Physical Therapy

## 2021-10-22 ENCOUNTER — Ambulatory Visit (INDEPENDENT_AMBULATORY_CARE_PROVIDER_SITE_OTHER): Payer: Medicare HMO | Admitting: Vascular Surgery

## 2021-10-22 VITALS — BP 147/68 | HR 70 | Resp 16

## 2021-10-22 DIAGNOSIS — I10 Essential (primary) hypertension: Secondary | ICD-10-CM

## 2021-10-22 DIAGNOSIS — J449 Chronic obstructive pulmonary disease, unspecified: Secondary | ICD-10-CM

## 2021-10-22 DIAGNOSIS — I739 Peripheral vascular disease, unspecified: Secondary | ICD-10-CM

## 2021-10-22 DIAGNOSIS — I63032 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: Secondary | ICD-10-CM | POA: Diagnosis not present

## 2021-10-22 DIAGNOSIS — Z794 Long term (current) use of insulin: Secondary | ICD-10-CM

## 2021-10-22 DIAGNOSIS — E1159 Type 2 diabetes mellitus with other circulatory complications: Secondary | ICD-10-CM

## 2021-10-24 ENCOUNTER — Ambulatory Visit: Payer: Medicare HMO | Admitting: Physical Therapy

## 2021-10-29 ENCOUNTER — Ambulatory Visit: Payer: Medicare HMO

## 2021-10-29 ENCOUNTER — Other Ambulatory Visit: Payer: Self-pay

## 2021-10-29 ENCOUNTER — Encounter: Payer: Self-pay | Admitting: Physical Therapy

## 2021-10-29 DIAGNOSIS — R2681 Unsteadiness on feet: Secondary | ICD-10-CM

## 2021-10-29 DIAGNOSIS — R262 Difficulty in walking, not elsewhere classified: Secondary | ICD-10-CM

## 2021-10-29 DIAGNOSIS — R269 Unspecified abnormalities of gait and mobility: Secondary | ICD-10-CM

## 2021-10-29 DIAGNOSIS — R278 Other lack of coordination: Secondary | ICD-10-CM

## 2021-10-29 DIAGNOSIS — M6281 Muscle weakness (generalized): Secondary | ICD-10-CM

## 2021-10-29 DIAGNOSIS — R2689 Other abnormalities of gait and mobility: Secondary | ICD-10-CM

## 2021-10-29 DIAGNOSIS — R293 Abnormal posture: Secondary | ICD-10-CM

## 2021-10-29 NOTE — Therapy (Signed)
[facility] ?[place], [place], [address] ?Phone: [phone]   Fax:  [phone] ? ?Physical Therapy Treatment ? ?Patient Details  ?Name: [name] ?MRN: [id number] ?Date of Birth: 06/20/1941 ?Referring Provider (PT): Adamo, Elana M ? ? ?Encounter Date: 10/29/2021 ? ? PT End of Session - 10/29/21 1434   ? ? Visit Number 35   ? Number of Visits 49   ? Date for PT Re-Evaluation 11/14/21   ? Authorization Type [employer] Medicare; Medicaid   ? Authorization Time Period 1/18-3/31, 24 visits   ? Authorization - Visit Number 1   ? Authorization - Number of Visits 24   ? PT Start Time 7253   ? PT Stop Time 1515   ? PT Time Calculation (min) 44 min   ? Equipment Utilized During Treatment Gait belt   ? Activity Tolerance Patient limited by fatigue   ? Behavior During Therapy Flat affect   ? ?  ?  ? ?  ? ? ?Past Medical History:  ?Diagnosis Date  ? Acute heart failure ([place])   ? Aortic stenosis   ? CHF (congestive heart failure) ([place])   ? Complication of anesthesia   ? Hard to wake patient up after having anesthesia  ? Diabetes mellitus without complication ([place])   ? Diabetic neuropathy ([place])   ? Takes Lyrica  ? Edema of both legs   ? Takes Lasix  ? GERD (gastroesophageal reflux disease)   ? High cholesterol   ? HTN (hypertension)   ? Hypertension   ? PAD (peripheral artery disease) ([place])   ? Shortness of breath dyspnea   ? Spasm of back muscles   ? Stroke [facility])   ? Wound, open   ? Right great toe  ? ? ?Past Surgical History:  ?Procedure Laterality Date  ? AMPUTATION TOE Left 06/09/2019  ? Procedure: Left third toe and partial great toe amputation and debridement;  Surgeon: Katha Cabal, MD;  Location: [facility];  Service: Vascular;  Laterality: Left;  ? AMPUTATION TOE Left 04/06/2020  ? Procedure: AMPUTATION LEFT SECOND TOE;  Surgeon: Samara Deist, DPM;  Location: [facility];  Service: Podiatry;  Laterality: Left;  ? BYPASS GRAFT POPLITEAL TO TIBIAL Right  02/28/2016  ? Procedure: BYPASS GRAFT RIGHT BELOW KNEE POPLITEAL TO PERONEAL USING REVERSED RIGHT GREATER SAPHENOUS VEIN;  Surgeon: Conrad Alondra Park, MD;  Location: [facility];  Service: Vascular;  Laterality: Right;  ? CYST EXCISION    ? Abdomen  ? EYE SURGERY Bilateral   ? Cataract removal  ? INTRAMEDULLARY (IM) NAIL INTERTROCHANTERIC Left 02/04/2014  ? Procedure: INTRAMEDULLARY (IM) NAIL INTERTROCHANTRIC FEMORAL;  Surgeon: Mauri Pole, MD;  Location: [facility];  Service: Orthopedics;  Laterality: Left;  ? IR GENERIC HISTORICAL  03/01/2016  ? IR ANGIO INTRA EXTRACRAN SEL COM CAROTID INNOMINATE UNI R MOD SED 03/01/2016 [name], MD [facility]  ? IR GENERIC HISTORICAL  03/01/2016  ? IR ENDOVASC INTRACRANIAL INF OTHER THAN THROMBO ART INC DIAG ANGIO 03/01/2016 [name], MD [facility]  ? IR GENERIC HISTORICAL  03/01/2016  ? IR INTRAVSC STENT CERV CAROTID W/O EMB-PROT MOD SED INC ANGIO 03/01/2016 [name], MD [facility]  ? IR GENERIC HISTORICAL  06/03/2016  ? IR RADIOLOGIST EVAL & MGMT 06/03/2016 [facility]  ? LOWER EXTREMITY ANGIOGRAPHY Left 02/09/2019  ? Procedure: LOWER EXTREMITY ANGIOGRAPHY;  Surgeon: Katha Cabal, MD;  Location: [facility];  Service: Cardiovascular;  Laterality:  Left;  ? LOWER EXTREMITY ANGIOGRAPHY Left 03/10/2019  ? Procedure: LOWER EXTREMITY ANGIOGRAPHY;  Surgeon: Katha Cabal, MD;  Location: [facility];  Service: Cardiovascular;  Laterality: Left;  ? LOWER EXTREMITY ANGIOGRAPHY Left 04/27/2019  ? Procedure: LOWER EXTREMITY ANGIOGRAPHY;  Surgeon: Katha Cabal, MD;  Location: [facility];  Service: Cardiovascular;  Laterality: Left;  ? LOWER EXTREMITY ANGIOGRAPHY Left 06/08/2019  ? Procedure: Lower Extremity Angiography;  Surgeon: Katha Cabal, MD;  Location: [facility] [facility];  Service: Cardiovascular;  Laterality: Left;  ? LOWER EXTREMITY ANGIOGRAPHY Left 04/03/2020  ? Procedure: Lower Extremity Angiography;  Surgeon: Algernon Huxley, MD;  Location: [place] [facility];  Service: Cardiovascular;  Laterality: Left;  ? LOWER EXTREMITY ANGIOGRAPHY Right 10/20/2020  ? Procedure: Lower Extremity Angiography;  Surgeon: Katha Cabal, MD;  Location: [place];  Service: Cardiovascular;  Laterality: Right;  ? LOWER EXTREMITY ANGIOGRAPHY Left 01/23/2021  ? Procedure: LOWER EXTREMITY ANGIOGRAPHY;  Surgeon: Katha Cabal, MD;  Location: [place] [facility];  Service: Cardiovascular;  Laterality: Left;  ? LOWER EXTREMITY ANGIOGRAPHY Right 04/10/2021  ? Procedure: LOWER EXTREMITY ANGIOGRAPHY;  Surgeon: Katha Cabal, MD;  Location: [facility] [place] [facility];  Service: Cardiovascular;  Laterality: Right;  ? ORIF TOE FRACTURE Right 02/08/2014  ? Procedure: OPEN REDUCTION INTERNAL FIXATION Right METATARSAL  FRACTURE ;  Surgeon: Wylene Simmer, MD;  Location: [facility];  Service: Orthopedics;  Laterality: Right;  ? PERIPHERAL VASCULAR CATHETERIZATION N/A 12/28/2015  ? Procedure: Abdominal Aortogram w/Lower Extremity;  Surgeon: Conrad Matoaka, MD;  Location: [place] [facility];  Service: Cardiovascular;  Laterality: N/A;  ? RADIOLOGY WITH ANESTHESIA N/A 03/01/2016  ? Procedure: RADIOLOGY WITH ANESTHESIA;  Surgeon: [name], MD;  Location: [facility];  Service: Radiology;  Laterality: N/A;  ? VEIN HARVEST Right 02/28/2016  ? Procedure: RIGHT GREATER SAPHENOUS VEIN HARVEST;  Surgeon: Conrad , MD;  Location: [facility];  Service: Vascular;  Laterality: Right;  ? ? ?There were no vitals filed for this visit. ? ? Subjective Assessment - 10/29/21 1432   ? ? Subjective Pt reports her "butt hurts" and also has concerns of her feet hurting. Spouse reports her R toe is doing better and MD did not have any serious concerns. Reports it was a toe nail issue.   ? Patient is accompained by: Family member   ? Pertinent History 80yoF presenting to [place] to address recent gradual progressive decline in mobility, family goals for improved strength, to promote >  participation in transfers to wheelchair and safety with ADL performance. Recent illness includes admission to [facility] on 03/07/21-03/10/21 due to COVID infection c AMS and fever. Pt then underwent outpatient recanalization/limb salvage of RLE 04/10/2021; LLE salvage performed in June 2022. Other PMH: CHF, DM, HTN, CVA, COPD, multiple remote toe amputations, stg 2 sacral ulcer. PLOF includes 3-4 years bed bound status, hoyerlifts to WC, totalA for ADL performance. Pt is cared for by family, as well as professional caregiver services.   ? Limitations Sitting;Walking;Lifting;Standing;House hold activities   ? How long can you sit comfortably? Pt spouse reports 2 hours. Must be careful d/t preventing and trying to heal wounds on bottom.   ? How long can you stand comfortably? unable   ? How long can you walk comfortably? Unable   ? Diagnostic tests 6/21 and 9/06 peripheral vascular catheterizatoin; 04/10/2021 Successful recanalization right lower extremity for limb salvage   ? Patient Stated Goals to  strengthen pt's trunk to improve mobility in chair, assist with ADLs   ? Pain Onset More than a month ago   ? ?  ?  ? ?  ? ?There.ex: ?In front of stairs:  ?AAROM picking foot up and placing on step bilateral LE x10; max cueing for sequencing required ?Cross body cone stacking: 2x8 cones /side.  ? ?Forwards cone reaches with cones: x8/UE for anterior weight shift  ? ?  ?in chair:  ? ?forward trunk lean holding onto chair for upright position, hold upright position away from back of chair 6-8 second holds x 5 trials ? ?Cross body cone stacking: 2x8 cones /side. Pt relies on heavy UE support on arm rest despite max VC's to prevent in attempts to improve core control/stability.  ? ?Forwards cone reaches with cones: x8/UE for anterior weight shift  ? ? Hamstring/gastroc stretch: x1, 15 sec holds/LE. Discontinued pt requesting to stop ? ?  YTB scap retractions for postural stability and upright posture: x8 ? ? ?Pt requiring max  encouragement and max multimodal cues for very poor to fair carryover for full participation in session. Pt requires near maxA of S/BUE on arm rest(s) to maintain upright posture with back unsupported. Multiple t

## 2021-10-31 ENCOUNTER — Encounter: Payer: Self-pay | Admitting: Physical Therapy

## 2021-10-31 ENCOUNTER — Ambulatory Visit: Payer: Medicare HMO | Admitting: Physical Therapy

## 2021-10-31 ENCOUNTER — Other Ambulatory Visit (INDEPENDENT_AMBULATORY_CARE_PROVIDER_SITE_OTHER): Payer: Self-pay | Admitting: Nurse Practitioner

## 2021-10-31 DIAGNOSIS — R2689 Other abnormalities of gait and mobility: Secondary | ICD-10-CM

## 2021-10-31 DIAGNOSIS — R2681 Unsteadiness on feet: Secondary | ICD-10-CM

## 2021-10-31 DIAGNOSIS — I739 Peripheral vascular disease, unspecified: Secondary | ICD-10-CM

## 2021-10-31 DIAGNOSIS — R278 Other lack of coordination: Secondary | ICD-10-CM

## 2021-10-31 DIAGNOSIS — R269 Unspecified abnormalities of gait and mobility: Secondary | ICD-10-CM

## 2021-10-31 DIAGNOSIS — R262 Difficulty in walking, not elsewhere classified: Secondary | ICD-10-CM

## 2021-10-31 DIAGNOSIS — R293 Abnormal posture: Secondary | ICD-10-CM

## 2021-10-31 DIAGNOSIS — M6281 Muscle weakness (generalized): Secondary | ICD-10-CM | POA: Diagnosis not present

## 2021-10-31 NOTE — Therapy (Signed)
[facility] ?[place], [place], [address] ?Phone: [phone]   Fax:  [phone] ? ?Physical Therapy Treatment ? ?Patient Details  ?Name: [name] ?MRN: [id number] ?Date of Birth: [birth year] ?Referring Provider (PT): Adamo, Elana M ? ? ?Encounter Date: 10/31/2021 ? ? PT End of Session - 10/31/21 1341   ? ? Visit Number 36   ? Number of Visits 49   ? Date for PT Re-Evaluation 11/14/21   ? Authorization Type [employer] Medicare; Medicaid   ? Authorization Time Period 1/18-3/31, 24 visits   ? Authorization - Visit Number 1   ? Authorization - Number of Visits 24   ? PT Start Time 1232   ? PT Stop Time 1315   ? PT Time Calculation (min) 43 min   ? Equipment Utilized During Treatment Gait belt   ? Activity Tolerance Patient limited by fatigue   ? Behavior During Therapy Flat affect   ? ?  ?  ? ?  ? ? ?Past Medical History:  ?Diagnosis Date  ? Acute heart failure ([place])   ? Aortic stenosis   ? CHF (congestive heart failure) ([place])   ? Complication of anesthesia   ? Hard to wake patient up after having anesthesia  ? Diabetes mellitus without complication ([place])   ? Diabetic neuropathy ([place])   ? Takes Lyrica  ? Edema of both legs   ? Takes Lasix  ? GERD (gastroesophageal reflux disease)   ? High cholesterol   ? HTN (hypertension)   ? Hypertension   ? PAD (peripheral artery disease) ([place])   ? Shortness of breath dyspnea   ? Spasm of back muscles   ? Stroke [facility])   ? Wound, open   ? Right great toe  ? ? ?Past Surgical History:  ?Procedure Laterality Date  ? AMPUTATION TOE Left 06/09/2019  ? Procedure: Left third toe and partial great toe amputation and debridement;  Surgeon: Katha Cabal, MD;  Location: [facility];  Service: Vascular;  Laterality: Left;  ? AMPUTATION TOE Left 04/06/2020  ? Procedure: AMPUTATION LEFT SECOND TOE;  Surgeon: Samara Deist, DPM;  Location: [facility];  Service: Podiatry;  Laterality: Left;  ? BYPASS GRAFT POPLITEAL TO TIBIAL Right  02/28/2016  ? Procedure: BYPASS GRAFT RIGHT BELOW KNEE POPLITEAL TO PERONEAL USING REVERSED RIGHT GREATER SAPHENOUS VEIN;  Surgeon: Conrad Big Coppitt Key, MD;  Location: [facility];  Service: Vascular;  Laterality: Right;  ? CYST EXCISION    ? Abdomen  ? EYE SURGERY Bilateral   ? Cataract removal  ? INTRAMEDULLARY (IM) NAIL INTERTROCHANTERIC Left 02/04/2014  ? Procedure: INTRAMEDULLARY (IM) NAIL INTERTROCHANTRIC FEMORAL;  Surgeon: Mauri Pole, MD;  Location: [facility];  Service: Orthopedics;  Laterality: Left;  ? IR GENERIC HISTORICAL  03/01/2016  ? IR ANGIO INTRA EXTRACRAN SEL COM CAROTID INNOMINATE UNI R MOD SED 03/01/2016 [name], MD [facility]  ? IR GENERIC HISTORICAL  03/01/2016  ? IR ENDOVASC INTRACRANIAL INF OTHER THAN THROMBO ART INC DIAG ANGIO 03/01/2016 [name], MD [facility]  ? IR GENERIC HISTORICAL  03/01/2016  ? IR INTRAVSC STENT CERV CAROTID W/O EMB-PROT MOD SED INC ANGIO 03/01/2016 [name], MD [facility]  ? IR GENERIC HISTORICAL  06/03/2016  ? IR RADIOLOGIST EVAL & MGMT 06/03/2016 [facility]  ? LOWER EXTREMITY ANGIOGRAPHY Left 02/09/2019  ? Procedure: LOWER EXTREMITY ANGIOGRAPHY;  Surgeon: Katha Cabal, MD;  Location: [place] [facility];  Service: Cardiovascular;  Laterality:  Left;  ? LOWER EXTREMITY ANGIOGRAPHY Left 03/10/2019  ? Procedure: LOWER EXTREMITY ANGIOGRAPHY;  Surgeon: Katha Cabal, MD;  Location: [facility];  Service: Cardiovascular;  Laterality: Left;  ? LOWER EXTREMITY ANGIOGRAPHY Left 04/27/2019  ? Procedure: LOWER EXTREMITY ANGIOGRAPHY;  Surgeon: Katha Cabal, MD;  Location: [place] [facility];  Service: Cardiovascular;  Laterality: Left;  ? LOWER EXTREMITY ANGIOGRAPHY Left 06/08/2019  ? Procedure: Lower Extremity Angiography;  Surgeon: Katha Cabal, MD;  Location: [place];  Service: Cardiovascular;  Laterality: Left;  ? LOWER EXTREMITY ANGIOGRAPHY Left 04/03/2020  ? Procedure: Lower Extremity Angiography;  Surgeon: Algernon Huxley, MD;  Location: [facility];  Service: Cardiovascular;  Laterality: Left;  ? LOWER EXTREMITY ANGIOGRAPHY Right 10/20/2020  ? Procedure: Lower Extremity Angiography;  Surgeon: Katha Cabal, MD;  Location: [place] [facility];  Service: Cardiovascular;  Laterality: Right;  ? LOWER EXTREMITY ANGIOGRAPHY Left 01/23/2021  ? Procedure: LOWER EXTREMITY ANGIOGRAPHY;  Surgeon: Katha Cabal, MD;  Location: [place] [facility];  Service: Cardiovascular;  Laterality: Left;  ? LOWER EXTREMITY ANGIOGRAPHY Right 04/10/2021  ? Procedure: LOWER EXTREMITY ANGIOGRAPHY;  Surgeon: Katha Cabal, MD;  Location: [place] [facility];  Service: Cardiovascular;  Laterality: Right;  ? ORIF TOE FRACTURE Right 02/08/2014  ? Procedure: OPEN REDUCTION INTERNAL FIXATION Right METATARSAL  FRACTURE ;  Surgeon: Wylene Simmer, MD;  Location: [place];  Service: Orthopedics;  Laterality: Right;  ? PERIPHERAL VASCULAR CATHETERIZATION N/A 12/28/2015  ? Procedure: Abdominal Aortogram w/Lower Extremity;  Surgeon: Conrad Wickett, MD;  Location: [place] [facility];  Service: Cardiovascular;  Laterality: N/A;  ? RADIOLOGY WITH ANESTHESIA N/A 03/01/2016  ? Procedure: RADIOLOGY WITH ANESTHESIA;  Surgeon: [name], MD;  Location: [place];  Service: Radiology;  Laterality: N/A;  ? VEIN HARVEST Right 02/28/2016  ? Procedure: RIGHT GREATER SAPHENOUS VEIN HARVEST;  Surgeon: Conrad Elkton, MD;  Location: [place];  Service: Vascular;  Laterality: Right;  ? ? ?There were no vitals filed for this visit. ? ? Subjective Assessment - 10/31/21 1242   ? ? Subjective Patient reports increased soreness in feet/legs and buttocks; She reports hurting " a lot" . Husband reports that bed exercises are going better;   ? Patient is accompained by: Family member   ? Pertinent History 80yoF presenting to [place] to address recent gradual progressive decline in mobility, family goals for improved strength, to promote > participation in transfers to wheelchair  and safety with ADL performance. Recent illness includes admission to [facility] on 03/07/21-03/10/21 due to COVID infection c AMS and fever. Pt then underwent outpatient recanalization/limb salvage of RLE 04/10/2021; LLE salvage performed in June 2022. Other PMH: CHF, DM, HTN, CVA, COPD, multiple remote toe amputations, stg 2 sacral ulcer. PLOF includes 3-4 years bed bound status, hoyerlifts to WC, totalA for ADL performance. Pt is cared for by family, as well as professional caregiver services.   ? Limitations Sitting;Walking;Lifting;Standing;House hold activities   ? How long can you sit comfortably? Pt spouse reports 2 hours. Must be careful d/t preventing and trying to heal wounds on bottom.   ? How long can you stand comfortably? unable   ? How long can you walk comfortably? Unable   ? Diagnostic tests 6/21 and 9/06 peripheral vascular catheterizatoin; 04/10/2021 Successful recanalization right lower extremity for limb salvage   ? Patient Stated Goals to strengthen pt's trunk to improve mobility in chair, assist with ADLs   ?  Currently in Pain? Yes   ? Pain Score --   unable to rate, states that its a lot;  ? Pain Location Leg   ? Pain Orientation Right;Left   ? Pain Descriptors / Indicators Aching;Sore   ? Pain Type Chronic pain   ? Pain Onset More than a month ago   ? Pain Frequency Intermittent   ? Aggravating Factors  worse with movement   ? Pain Relieving Factors rest   ? Effect of Pain on Daily Activities decreased mobility;   ? Multiple Pain Sites No   ? ?  ?  ? ?  ? ? ? ?TREATMENT: ?Seated in front of parallel bars: ? ?Forward reach tapping small ball to parallel bars x10 reps with cues for trunk control, min A to start able to progress to supervision; Required mod VCs for initiation of exercise and cues for positioning;  ? ?Seated: ?Hip flexion against yellow ball for visual target x10 reps, able to exhibit better AROM this session with less assistance; Often does switch to LAQ requiring cues to resume hip flexion  march;  ?BLE LAQ with ball between feet for LE positioning lifting ball for visual target x10 reps; ? ?Sit<>Stand at parallel bars with patient holding onto bars with BUE x3 attempts, partial stand, max

## 2021-11-01 ENCOUNTER — Encounter (INDEPENDENT_AMBULATORY_CARE_PROVIDER_SITE_OTHER): Payer: Medicare HMO

## 2021-11-01 ENCOUNTER — Ambulatory Visit (INDEPENDENT_AMBULATORY_CARE_PROVIDER_SITE_OTHER): Payer: Medicare HMO | Admitting: Nurse Practitioner

## 2021-11-04 ENCOUNTER — Emergency Department (HOSPITAL_COMMUNITY)
Admission: EM | Admit: 2021-11-04 | Discharge: 2021-11-04 | Disposition: A | Discharge status: Home / Self Care | Payer: Medicare HMO | Attending: Emergency Medicine | Admitting: Emergency Medicine

## 2021-11-04 ENCOUNTER — Other Ambulatory Visit: Payer: Self-pay

## 2021-11-04 ENCOUNTER — Encounter (HOSPITAL_COMMUNITY): Payer: Self-pay | Admitting: Emergency Medicine

## 2021-11-04 DIAGNOSIS — R42 Dizziness and giddiness: Secondary | ICD-10-CM | POA: Insufficient documentation

## 2021-11-04 DIAGNOSIS — I10 Essential (primary) hypertension: Secondary | ICD-10-CM | POA: Insufficient documentation

## 2021-11-04 DIAGNOSIS — N390 Urinary tract infection, site not specified: Secondary | ICD-10-CM | POA: Insufficient documentation

## 2021-11-04 DIAGNOSIS — E119 Type 2 diabetes mellitus without complications: Secondary | ICD-10-CM | POA: Insufficient documentation

## 2021-11-04 DIAGNOSIS — Z79899 Other long term (current) drug therapy: Secondary | ICD-10-CM | POA: Insufficient documentation

## 2021-11-04 DIAGNOSIS — R55 Syncope and collapse: Secondary | ICD-10-CM

## 2021-11-04 DIAGNOSIS — Z794 Long term (current) use of insulin: Secondary | ICD-10-CM | POA: Diagnosis not present

## 2021-11-04 DIAGNOSIS — Z7982 Long term (current) use of aspirin: Secondary | ICD-10-CM | POA: Insufficient documentation

## 2021-11-04 LAB — COMPREHENSIVE METABOLIC PANEL
ALT: 22 U/L (ref 0–44)
AST: 22 U/L (ref 15–41)
Albumin: 3.5 g/dL (ref 3.5–5.0)
Alkaline Phosphatase: 72 U/L (ref 38–126)
Anion gap: 9 (ref 5–15)
BUN: 14 mg/dL (ref 8–23)
CO2: 25 mmol/L (ref 22–32)
Calcium: 9.4 mg/dL (ref 8.9–10.3)
Chloride: 105 mmol/L (ref 98–111)
Creatinine, Ser: 0.58 mg/dL (ref 0.44–1.00)
GFR, Estimated: >60 mL/min (ref >60)
Glucose, Bld: 188 mg/dL — ABNORMAL HIGH (ref 70–99)
Potassium: 4.6 mmol/L (ref 3.5–5.1)
Sodium: 139 mmol/L (ref 135–145)
Total Bilirubin: 0.5 mg/dL (ref 0.3–1.2)
Total Protein: 6.9 g/dL (ref 6.5–8.1)

## 2021-11-04 LAB — CBC
HCT: 40.2 % (ref 36.0–46.0)
Hemoglobin: 12.1 g/dL (ref 12.0–15.0)
MCH: 24.7 pg — ABNORMAL LOW (ref 26.0–34.0)
MCHC: 30.1 g/dL (ref 30.0–36.0)
MCV: 82.2 fL (ref 80.0–100.0)
Platelets: 210 10*3/uL (ref 150–400)
RBC: 4.89 MIL/uL (ref 3.87–5.11)
RDW: 16.5 % — ABNORMAL HIGH (ref 11.5–15.5)
WBC: 14.6 10*3/uL — ABNORMAL HIGH (ref 4.0–10.5)
nRBC: 0 % (ref 0.0–0.2)

## 2021-11-04 LAB — URINALYSIS, ROUTINE W REFLEX MICROSCOPIC
Bacteria, UA: NONE SEEN
Bilirubin Urine: NEGATIVE
Glucose, UA: NEGATIVE mg/dL
Hgb urine dipstick: NEGATIVE
Ketones, ur: NEGATIVE mg/dL
Nitrite: POSITIVE — AB
Protein, ur: 30 mg/dL — AB
Specific Gravity, Urine: 1.009 (ref 1.005–1.030)
WBC, UA: >50 WBC/hpf — ABNORMAL HIGH (ref 0–5)
pH: 5 (ref 5.0–8.0)

## 2021-11-04 MED ORDER — CEFDINIR 300 MG PO CAPS: 300.0000 mg | ORAL_CAPSULE | Freq: Two times a day (BID) | ORAL | 0 refills | Status: DC

## 2021-11-04 MED ORDER — SODIUM CHLORIDE 0.9 % IV SOLN
1.0000 g | Freq: Once | INTRAVENOUS | Status: AC
  Administered 2021-11-04: 1 g via INTRAVENOUS
  Filled 2021-11-04: qty 10

## 2021-11-04 NOTE — ED Notes (Signed)
Pt's family notified that EMS was here to transport pt back home. ?

## 2021-11-04 NOTE — ED Provider Notes (Signed)
?[place] ?Provider Note ? ? ?CSN: [id number] ?Arrival date & time: 11/04/21  1622 ? ?  ? ?History ? ?Chief Complaint  ?Patient presents with  ? Loss of Consciousness  ? ? ?[name] is a 81 y.o. female. ? ?Patient presents via EMS after syncopal event at home just pta today. Pt was sitting in her wheelchair (pt not ambulatory at baseline), when appeared asleep, slumped forward. Did not fall to ground/no trauma. No chest pain or discomfort. No sob. No nausea/vomiting or diarrhea. No abd pain. No dysuria or gu c/o, +hx uti. No recent blood loss or rectal bleeding or melena. Relatively poor po intake today. No change in meds/new meds.  No fever or chills. Mental status has remained at baseline - no change in speech or vision, no numbness/weakness, no headache.  ? ?The history is provided by the patient, medical records, the EMS personnel and a relative.  ?Loss of Consciousness ?Associated symptoms: no chest pain, no confusion, no fever, no headaches, no palpitations, no shortness of breath, no vomiting and no weakness   ? ?  ? ?Home Medications ?Prior to Admission medications   ?Medication Sig Start Date End Date Taking? Authorizing Provider  ?acetaminophen (TYLENOL) 500 MG tablet Take 1,000 mg by mouth every 6 (six) hours as needed for mild pain.   Yes [provider]  ?amLODipine (NORVASC) 10 MG tablet Take 10 mg by mouth daily.   Yes [provider]  ?aspirin EC 81 MG EC tablet Take 1 tablet (81 mg total) by mouth daily. 04/29/19  Yes Stegmayer, Joelene Millin A, PA-C  ?atorvastatin (LIPITOR) 20 MG tablet Take 1 tablet (20 mg total) by mouth daily. ?Patient taking differently: Take 20 mg by mouth at bedtime. 03/13/16  Yes Virgina Jock A, PA-C  ?budesonide (PULMICORT) 0.25 MG/2ML nebulizer solution Take 2 mLs (0.25 mg total) by nebulization 2 (two) times daily. 06/10/18  Yes Dustin Flock, MD  ?carbamide peroxide (DEBROX) 6.5 % OTIC solution Place 5 drops into both ears 2 (two)  times daily as needed (ear wax).   Yes [provider]  ?Carboxymethylcellul-Glycerin (LUBRICATING EYE DROPS OP) Place 1 drop into both eyes daily as needed (dry eyes).   Yes [provider]  ?carvedilol (COREG) 6.25 MG tablet Take 6.25 mg by mouth 2 (two) times daily with a meal.   Yes [provider]  ?cetirizine (ZYRTEC) 10 MG tablet Take 10 mg by mouth daily.   Yes [provider]  ?cholecalciferol (VITAMIN D3) 25 MCG (1000 UNIT) tablet Take 1,000 Units by mouth daily.   Yes [provider]  ?clopidogrel (PLAVIX) 75 MG tablet Take 1 tablet (75 mg total) by mouth daily with breakfast. 03/04/17  Yes Conrad Wilkerson, MD  ?diclofenac Sodium (VOLTAREN) 1 % GEL Apply 1 application topically 4 (four) times daily as needed (pain).   Yes [provider]  ?docusate sodium (COLACE) 100 MG capsule Take 100 mg by mouth daily.   Yes [provider]  ?fluticasone (FLONASE) 50 MCG/ACT nasal spray Place 1 spray into both nostrils daily as needed for allergies.    Yes [provider]  ?gabapentin (NEURONTIN) 300 MG capsule Take 300 mg by mouth at bedtime.   Yes [provider]  ?hydrALAZINE (APRESOLINE) 25 MG tablet Take 1 tablet (25 mg total) by mouth every 8 (eight) hours. 12/28/18  Yes Fritzi Mandes, MD  ?insulin glargine (LANTUS) 100 UNIT/ML injection Inject 0.1 mLs (10 Units total) into the skin at bedtime. ?  Patient taking differently: Inject 18 Units into the skin at bedtime. 03/10/21  Yes Kathie Dike, MD  ?magnesium hydroxide (MILK OF MAGNESIA) 400 MG/5ML suspension Take 15 mLs by mouth daily as needed for mild constipation.   Yes [provider]  ?magnesium oxide (MAG-OX) 400 MG tablet Take 400 mg by mouth 2 (two) times daily.    Yes [provider]  ?modafinil (PROVIGIL) 200 MG tablet Take 200 mg by mouth daily.   Yes [provider]  ?[facility] powder Apply 1 application topically daily. 03/05/21  Yes [provider]  ?omeprazole (PRILOSEC) 20 MG capsule Take 1 capsule (20 mg total) by mouth daily. 04/17/18  Yes Henreitta Leber, MD  ?polyethylene glycol (MIRALAX / GLYCOLAX) packet Take 17 g by mouth 2 (two) times daily. ?Patient taking differently: Take 17 g by mouth daily as needed for moderate constipation. 02/09/14  Yes Thurnell Lose, MD  ?potassium chloride SA (K-DUR,KLOR-CON) 20 MEQ tablet Take 20 mEq by mouth daily.   Yes [provider]  ?torsemide (DEMADEX) 20 MG tablet Take 1 tablet (20 mg total) by mouth 2 (two) times daily. 12/28/18  Yes Fritzi Mandes, MD  ?Vitamin E 180 MG CAPS Take 180 mg by mouth daily.   Yes [provider]  ?   ? ?Allergies    ?Eggs or egg-derived products, Iodine, Penicillins, Shellfish allergy, Sulfa antibiotics, Sulfacetamide sodium, Sulfasalazine, and Morphine and related   ? ?Review of Systems   ?Review of Systems  ?Constitutional:  Negative for chills and fever.  ?HENT:  Negative for sore throat.   ?Eyes:  Negative for pain, redness and visual disturbance.  ?Respiratory:  Negative for cough and shortness of breath.   ?Cardiovascular:  Positive for syncope. Negative for chest pain, palpitations and leg swelling.  ?Gastrointestinal:  Negative for abdominal pain, blood in stool, diarrhea and vomiting.  ?Genitourinary:  Negative for dysuria and flank pain.  ?Musculoskeletal:  Negative for back pain and neck pain.  ?Skin:  Negative for rash.  ?Neurological:  Negative for speech difficulty, weakness, numbness and headaches.  ?Hematological:  Does not bruise/bleed easily.  ?Psychiatric/Behavioral:  Negative for confusion.   ? ?Physical Exam ?Updated Vital Signs ?BP (!) 142/49   Pulse 66   Temp (!) 97.5 ?F (36.4 ?C) (Oral)   Resp 17   Ht 1.651 m ('5\' 5"'$ )   Wt 66.2 kg   SpO2 98%   BMI 24.29 kg/m?  ?Physical Exam ?Vitals and nursing note reviewed.  ?Constitutional:   ?   Appearance: Normal appearance. She is well-developed.  ?HENT:  ?   Head: Atraumatic.  ?   Nose: Nose  normal.  ?   Mouth/Throat:  ?   Mouth: Mucous membranes are moist.  ?Eyes:  ?   General: No scleral icterus. ?   Conjunctiva/sclera: Conjunctivae normal.  ?   Pupils: Pupils are equal, round, and reactive to light.  ?Neck:  ?   Vascular: No carotid bruit.  ?   Trachea: No tracheal deviation.  ?Cardiovascular:  ?   Rate and Rhythm: Normal rate and regular rhythm.  ?   Pulses: Normal pulses.  ?   Heart sounds: Normal heart sounds. No murmur heard. ?  No friction rub. No gallop.  ?Pulmonary:  ?   Effort: Pulmonary effort is normal. No respiratory distress.  ?   Breath sounds: Normal breath sounds.  ?Abdominal:  ?   General: Bowel sounds are normal. There is no distension.  ?   Palpations:  Abdomen is soft. There is no mass.  ?   Tenderness: There is no abdominal tenderness. There is no guarding.  ?Genitourinary: ?   Comments: No cva tenderness.  ?Musculoskeletal:     ?   General: No swelling or tenderness.  ?   Cervical back: Normal range of motion and neck supple. No rigidity. No muscular tenderness.  ?   Right lower leg: No edema.  ?   Left lower leg: No edema.  ?Skin: ?   General: Skin is warm and dry.  ?   Coloration: Pallor: qaz.  ?   Findings: No rash.  ?Neurological:  ?   General: No focal deficit present.  ?   Mental Status: She is alert. Mental status is at baseline.  ?   Cranial Nerves: No cranial nerve deficit.  ?   Comments: Alert, speech normal. Motor/sens grossly intact bil, stre 5/5. No pronator drift.   ?Psychiatric:     ?   Mood and Affect: Mood normal.  ? ? ?ED Results / Procedures / Treatments   ?Labs ?(all labs ordered are listed, but only abnormal results are displayed) ?Results for orders placed or performed during the hospital encounter of 11/04/21  ?CBC  ?Result Value Ref Range  ? WBC 14.6 (H) 4.0 - 10.5 K/uL  ? RBC 4.89 3.87 - 5.11 MIL/uL  ? Hemoglobin 12.1 12.0 - 15.0 g/dL  ? HCT 40.2 36.0 - 46.0 %  ? MCV 82.2 80.0 - 100.0 fL  ? MCH 24.7 (L) 26.0 - 34.0 pg  ? MCHC 30.1 30.0 - 36.0 g/dL  ? RDW  16.5 (H) 11.5 - 15.5 %  ? Platelets 210 150 - 400 K/uL  ? nRBC 0.0 0.0 - 0.2 %  ?Comprehensive metabolic panel  ?Result Value Ref Range  ? Sodium 139 135 - 145 mmol/L  ? Potassium 4.6 3.5 - 5.1 mmol/L  ? Chlorid

## 2021-11-04 NOTE — Discharge Instructions (Addendum)
It was our pleasure to provide your ER care today - we hope that you feel better. ? ?Drink plenty of fluids/stay well hydrated. ? ?Take antibiotic as prescribed for possible urine infection. ? ?Follow up with primary care doctor in the coming week. ? ?Return to ER if worse, new symptoms, fevers, new/severe pain, chest pain, trouble breathing, persistent vomiting, fainting, or other concern.  ?

## 2021-11-04 NOTE — ED Notes (Signed)
Pt was given dinner tray at this time. Revis Whalin ?

## 2021-11-04 NOTE — ED Triage Notes (Signed)
Pt arrives via EMS from home with reports of syncopal episode while at home with family. Pt was sitting in wheelchair when she slumped over. ?Initial BP for EMS was 80/40, attempted IV access x3 times and reports BP was normal en route.  ?

## 2021-11-05 ENCOUNTER — Ambulatory Visit: Payer: Medicare HMO | Admitting: Physical Therapy

## 2021-11-06 ENCOUNTER — Encounter (INDEPENDENT_AMBULATORY_CARE_PROVIDER_SITE_OTHER): Payer: Self-pay | Admitting: Vascular Surgery

## 2021-11-06 NOTE — Progress Notes (Signed)
? ? ?MRN : [id number] ? ?[name] is a 81 y.o. (07/28/1941) female who presents with chief complaint of check feet. ? ?History of Present Illness:  ?The patient returns to the office for followup and concerns that there is a new ulcer. There have been no interval changes in lower extremity pain symptoms. No interval worsening of her rest pain symptoms.  ? ?There have been no significant changes to the patient's overall health care. ? ?The patient denies amaurosis fugax or recent TIA symptoms. There are no recent neurological changes noted. ?The patient denies history of DVT, PE or superficial thrombophlebitis. ?The patient denies recent episodes of angina or shortness of breath.  ? ? ?Current Meds  ?Medication Sig  ? acetaminophen (TYLENOL) 500 MG tablet Take 1,000 mg by mouth every 6 (six) hours as needed for mild pain.  ? amLODipine (NORVASC) 10 MG tablet Take 10 mg by mouth daily.  ? aspirin EC 81 MG EC tablet Take 1 tablet (81 mg total) by mouth daily.  ? atorvastatin (LIPITOR) 20 MG tablet Take 1 tablet (20 mg total) by mouth daily. (Patient taking differently: Take 20 mg by mouth at bedtime.)  ? budesonide (PULMICORT) 0.25 MG/2ML nebulizer solution Take 2 mLs (0.25 mg total) by nebulization 2 (two) times daily.  ? carbamide peroxide (DEBROX) 6.5 % OTIC solution Place 5 drops into both ears 2 (two) times daily as needed (ear wax).  ? Carboxymethylcellul-Glycerin (LUBRICATING EYE DROPS OP) Place 1 drop into both eyes daily as needed (dry eyes).  ? carvedilol (COREG) 6.25 MG tablet Take 6.25 mg by mouth 2 (two) times daily with a meal.  ? cetirizine (ZYRTEC) 10 MG tablet Take 10 mg by mouth daily.  ? cholecalciferol (VITAMIN D3) 25 MCG (1000 UNIT) tablet Take 1,000 Units by mouth daily.  ? clopidogrel (PLAVIX) 75 MG tablet Take 1 tablet (75 mg total) by mouth daily with breakfast.  ? diclofenac Sodium (VOLTAREN) 1 % GEL Apply 1 application topically 4 (four) times daily as needed (pain).  ? docusate sodium  (COLACE) 100 MG capsule Take 100 mg by mouth daily.  ? fluticasone (FLONASE) 50 MCG/ACT nasal spray Place 1 spray into both nostrils daily as needed for allergies.   ? gabapentin (NEURONTIN) 300 MG capsule Take 300 mg by mouth at bedtime.  ? hydrALAZINE (APRESOLINE) 25 MG tablet Take 1 tablet (25 mg total) by mouth every 8 (eight) hours.  ? insulin glargine (LANTUS) 100 UNIT/ML injection Inject 0.1 mLs (10 Units total) into the skin at bedtime. (Patient taking differently: Inject 18 Units into the skin at bedtime.)  ? magnesium hydroxide (MILK OF MAGNESIA) 400 MG/5ML suspension Take 15 mLs by mouth daily as needed for mild constipation.  ? magnesium oxide (MAG-OX) 400 MG tablet Take 400 mg by mouth 2 (two) times daily.   ? modafinil (PROVIGIL) 200 MG tablet Take 200 mg by mouth daily.  ? [facility] powder Apply 1 application topically daily.  ? omeprazole (PRILOSEC) 20 MG capsule Take 1 capsule (20 mg total) by mouth daily.  ? polyethylene glycol (MIRALAX / GLYCOLAX) packet Take 17 g by mouth 2 (two) times daily. (Patient taking differently: Take 17 g by mouth daily as needed for moderate constipation.)  ? potassium chloride SA (K-DUR,KLOR-CON) 20 MEQ tablet Take 20 mEq by mouth daily.  ? torsemide (DEMADEX) 20 MG tablet Take 1 tablet (20 mg total) by mouth 2 (two) times daily.  ? Vitamin E 180 MG CAPS Take 180 mg by mouth daily.  ? [  DISCONTINUED] LANTUS SOLOSTAR 100 UNIT/ML Solostar Pen SMARTSIG:18 Unit(s) SUB-Q Every Morning  ? ? ?Past Medical History:  ?Diagnosis Date  ? Acute heart failure ([place])   ? Aortic stenosis   ? CHF (congestive heart failure) ([place])   ? Complication of anesthesia   ? Hard to wake patient up after having anesthesia  ? Diabetes mellitus without complication ([place])   ? Diabetic neuropathy ([place])   ? Takes Lyrica  ? Edema of both legs   ? Takes Lasix  ? GERD (gastroesophageal reflux disease)   ? High cholesterol   ? HTN (hypertension)   ? Hypertension   ? PAD (peripheral artery disease) ([place])   ?  Shortness of breath dyspnea   ? Spasm of back muscles   ? Stroke [facility])   ? Wound, open   ? Right great toe  ? ? ?Past Surgical History:  ?Procedure Laterality Date  ? AMPUTATION TOE Left 06/09/2019  ? Procedure: Left third toe and partial great toe amputation and debridement;  Surgeon: Katha Cabal, MD;  Location: [facility];  Service: Vascular;  Laterality: Left;  ? AMPUTATION TOE Left 04/06/2020  ? Procedure: AMPUTATION LEFT SECOND TOE;  Surgeon: Samara Deist, DPM;  Location: [facility];  Service: Podiatry;  Laterality: Left;  ? BYPASS GRAFT POPLITEAL TO TIBIAL Right 02/28/2016  ? Procedure: BYPASS GRAFT RIGHT BELOW KNEE POPLITEAL TO PERONEAL USING REVERSED RIGHT GREATER SAPHENOUS VEIN;  Surgeon: Conrad Gardiner, MD;  Location: [place];  Service: Vascular;  Laterality: Right;  ? CYST EXCISION    ? Abdomen  ? EYE SURGERY Bilateral   ? Cataract removal  ? INTRAMEDULLARY (IM) NAIL INTERTROCHANTERIC Left 02/04/2014  ? Procedure: INTRAMEDULLARY (IM) NAIL INTERTROCHANTRIC FEMORAL;  Surgeon: Mauri Pole, MD;  Location: [place];  Service: Orthopedics;  Laterality: Left;  ? IR GENERIC HISTORICAL  03/01/2016  ? IR ANGIO INTRA EXTRACRAN SEL COM CAROTID INNOMINATE UNI R MOD SED 03/01/2016 [name], MD [facility]  ? IR GENERIC HISTORICAL  03/01/2016  ? IR ENDOVASC INTRACRANIAL INF OTHER THAN THROMBO ART INC DIAG ANGIO 03/01/2016 [name], MD [facility]  ? IR GENERIC HISTORICAL  03/01/2016  ? IR INTRAVSC STENT CERV CAROTID W/O EMB-PROT MOD SED INC ANGIO 03/01/2016 [name], MD [facility]  ? IR GENERIC HISTORICAL  06/03/2016  ? IR RADIOLOGIST EVAL & MGMT 06/03/2016 [facility]  ? LOWER EXTREMITY ANGIOGRAPHY Left 02/09/2019  ? Procedure: LOWER EXTREMITY ANGIOGRAPHY;  Surgeon: Katha Cabal, MD;  Location: [place] [facility];  Service: Cardiovascular;  Laterality: Left;  ? LOWER EXTREMITY ANGIOGRAPHY Left 03/10/2019  ? Procedure: LOWER EXTREMITY ANGIOGRAPHY;  Surgeon: Katha Cabal, MD;  Location:  [place] [facility];  Service: Cardiovascular;  Laterality: Left;  ? LOWER EXTREMITY ANGIOGRAPHY Left 04/27/2019  ? Procedure: LOWER EXTREMITY ANGIOGRAPHY;  Surgeon: Katha Cabal, MD;  Location: [place] [facility];  Service: Cardiovascular;  Laterality: Left;  ? LOWER EXTREMITY ANGIOGRAPHY Left 06/08/2019  ? Procedure: Lower Extremity Angiography;  Surgeon: Katha Cabal, MD;  Location: [place] [facility];  Service: Cardiovascular;  Laterality: Left;  ? LOWER EXTREMITY ANGIOGRAPHY Left 04/03/2020  ? Procedure: Lower Extremity Angiography;  Surgeon: Algernon Huxley, MD;  Location: [place] [facility];  Service: Cardiovascular;  Laterality: Left;  ? LOWER EXTREMITY ANGIOGRAPHY Right 10/20/2020  ? Procedure: Lower Extremity Angiography;  Surgeon: Katha Cabal, MD;  Location: [place] [facility];  Service: Cardiovascular;  Laterality: Right;  ? LOWER EXTREMITY ANGIOGRAPHY Left  01/23/2021  ? Procedure: LOWER EXTREMITY ANGIOGRAPHY;  Surgeon: Katha Cabal, MD;  Location: [place] [facility];  Service: Cardiovascular;  Laterality: Left;  ? LOWER EXTREMITY ANGIOGRAPHY Right 04/10/2021  ? Procedure: LOWER EXTREMITY ANGIOGRAPHY;  Surgeon: Katha Cabal, MD;  Location: [place] [facility];  Service: Cardiovascular;  Laterality: Right;  ? ORIF TOE FRACTURE Right 02/08/2014  ? Procedure: OPEN REDUCTION INTERNAL FIXATION Right METATARSAL  FRACTURE ;  Surgeon: Wylene Simmer, MD;  Location: [place];  Service: Orthopedics;  Laterality: Right;  ? PERIPHERAL VASCULAR CATHETERIZATION N/A 12/28/2015  ? Procedure: Abdominal Aortogram w/Lower Extremity;  Surgeon: Conrad Breckenridge, MD;  Location: [place] [facility];  Service: Cardiovascular;  Laterality: N/A;  ? RADIOLOGY WITH ANESTHESIA N/A 03/01/2016  ? Procedure: RADIOLOGY WITH ANESTHESIA;  Surgeon: [name], MD;  Location: [place];  Service: Radiology;  Laterality: N/A;  ? VEIN HARVEST Right 02/28/2016  ? Procedure: RIGHT GREATER SAPHENOUS VEIN HARVEST;  Surgeon:  Conrad Homecroft, MD;  Location: [place];  Service: Vascular;  Laterality: Right;  ? ? ?Social History ?Social History  ? ?Tobacco Use  ? Smoking status: Never  ? Smokeless tobacco: Never  ? Tobacco comments:  ?

## 2021-11-07 ENCOUNTER — Ambulatory Visit: Payer: Medicare HMO | Admitting: Physical Therapy

## 2021-11-08 ENCOUNTER — Encounter (INDEPENDENT_AMBULATORY_CARE_PROVIDER_SITE_OTHER): Payer: Medicare HMO

## 2021-11-08 ENCOUNTER — Ambulatory Visit (INDEPENDENT_AMBULATORY_CARE_PROVIDER_SITE_OTHER): Payer: Medicare HMO | Admitting: Nurse Practitioner

## 2021-11-14 ENCOUNTER — Encounter: Payer: Self-pay | Admitting: Physical Therapy

## 2021-11-14 ENCOUNTER — Ambulatory Visit: Payer: Medicare HMO | Attending: Family Medicine | Admitting: Physical Therapy

## 2021-11-14 DIAGNOSIS — R293 Abnormal posture: Secondary | ICD-10-CM | POA: Diagnosis present

## 2021-11-14 DIAGNOSIS — R278 Other lack of coordination: Secondary | ICD-10-CM | POA: Diagnosis present

## 2021-11-14 DIAGNOSIS — R269 Unspecified abnormalities of gait and mobility: Secondary | ICD-10-CM | POA: Insufficient documentation

## 2021-11-14 DIAGNOSIS — R262 Difficulty in walking, not elsewhere classified: Secondary | ICD-10-CM | POA: Insufficient documentation

## 2021-11-14 DIAGNOSIS — R2689 Other abnormalities of gait and mobility: Secondary | ICD-10-CM | POA: Insufficient documentation

## 2021-11-14 DIAGNOSIS — R2681 Unsteadiness on feet: Secondary | ICD-10-CM | POA: Insufficient documentation

## 2021-11-14 DIAGNOSIS — M6281 Muscle weakness (generalized): Secondary | ICD-10-CM | POA: Diagnosis present

## 2021-11-14 NOTE — Therapy (Signed)
[facility] ?[place], [place], [address] ?Phone: [phone]   Fax:  [phone] ? ?Physical Therapy Treatment ? ?Patient Details  ?Name: [name] ?MRN: [id number] ?Date of Birth: 02-08-1941 ?Referring Provider (PT): Adamo, Elana M ? ? ?Encounter Date: 11/14/2021 ? ? PT End of Session - 11/15/21 7673   ? ? Visit Number 47   ? Number of Visits 65   ? Date for PT Re-Evaluation 01/10/22   ? Authorization Type [employer] Medicare; Medicaid   ? Authorization Time Period 11/05/21-02/01/22   ? Authorization - Visit Number 1   ? Authorization - Number of Visits 24   ? PT Start Time 1440   ? PT Stop Time 1515   ? PT Time Calculation (min) 35 min   ? Equipment Utilized During Treatment Gait belt   ? Activity Tolerance Patient limited by fatigue   ? Behavior During Therapy Flat affect   ? ?  ?  ? ?  ? ? ?Past Medical History:  ?Diagnosis Date  ? Acute heart failure ([place])   ? Aortic stenosis   ? CHF (congestive heart failure) ([place])   ? Complication of anesthesia   ? Hard to wake patient up after having anesthesia  ? Diabetes mellitus without complication ([place])   ? Diabetic neuropathy ([place])   ? Takes Lyrica  ? Edema of both legs   ? Takes Lasix  ? GERD (gastroesophageal reflux disease)   ? High cholesterol   ? HTN (hypertension)   ? Hypertension   ? PAD (peripheral artery disease) ([place])   ? Shortness of breath dyspnea   ? Spasm of back muscles   ? Stroke [facility])   ? Wound, open   ? Right great toe  ? ? ?Past Surgical History:  ?Procedure Laterality Date  ? AMPUTATION TOE Left 06/09/2019  ? Procedure: Left third toe and partial great toe amputation and debridement;  Surgeon: Katha Cabal, MD;  Location: [facility];  Service: Vascular;  Laterality: Left;  ? AMPUTATION TOE Left 04/06/2020  ? Procedure: AMPUTATION LEFT SECOND TOE;  Surgeon: Samara Deist, DPM;  Location: [facility];  Service: Podiatry;  Laterality: Left;  ? BYPASS GRAFT POPLITEAL TO TIBIAL Right  02/28/2016  ? Procedure: BYPASS GRAFT RIGHT BELOW KNEE POPLITEAL TO PERONEAL USING REVERSED RIGHT GREATER SAPHENOUS VEIN;  Surgeon: Conrad [place], MD;  Location: [place];  Service: Vascular;  Laterality: Right;  ? CYST EXCISION    ? Abdomen  ? EYE SURGERY Bilateral   ? Cataract removal  ? INTRAMEDULLARY (IM) NAIL INTERTROCHANTERIC Left 02/04/2014  ? Procedure: INTRAMEDULLARY (IM) NAIL INTERTROCHANTRIC FEMORAL;  Surgeon: Mauri Pole, MD;  Location: [place];  Service: Orthopedics;  Laterality: Left;  ? IR GENERIC HISTORICAL  03/01/2016  ? IR ANGIO INTRA EXTRACRAN SEL COM CAROTID INNOMINATE UNI R MOD SED 03/01/2016 [name], MD [facility]  ? IR GENERIC HISTORICAL  03/01/2016  ? IR ENDOVASC INTRACRANIAL INF OTHER THAN THROMBO ART INC DIAG ANGIO 03/01/2016 [name], MD [facility]  ? IR GENERIC HISTORICAL  03/01/2016  ? IR INTRAVSC STENT CERV CAROTID W/O EMB-PROT MOD SED INC ANGIO 03/01/2016 [name], MD [facility]  ? IR GENERIC HISTORICAL  06/03/2016  ? IR RADIOLOGIST EVAL & MGMT 06/03/2016 [facility]  ? LOWER EXTREMITY ANGIOGRAPHY Left 02/09/2019  ? Procedure: LOWER EXTREMITY ANGIOGRAPHY;  Surgeon: Katha Cabal, MD;  Location: [facility] [facility];  Service: Cardiovascular;  Laterality: Left;  ?  LOWER EXTREMITY ANGIOGRAPHY Left 03/10/2019  ? Procedure: LOWER EXTREMITY ANGIOGRAPHY;  Surgeon: Katha Cabal, MD;  Location: [place] [facility];  Service: Cardiovascular;  Laterality: Left;  ? LOWER EXTREMITY ANGIOGRAPHY Left 04/27/2019  ? Procedure: LOWER EXTREMITY ANGIOGRAPHY;  Surgeon: Katha Cabal, MD;  Location: [place] [facility];  Service: Cardiovascular;  Laterality: Left;  ? LOWER EXTREMITY ANGIOGRAPHY Left 06/08/2019  ? Procedure: Lower Extremity Angiography;  Surgeon: Katha Cabal, MD;  Location: [place] [facility];  Service: Cardiovascular;  Laterality: Left;  ? LOWER EXTREMITY ANGIOGRAPHY Left 04/03/2020  ? Procedure: Lower Extremity Angiography;  Surgeon: Algernon Huxley, MD;  Location: [facility] [facility];  Service: Cardiovascular;  Laterality: Left;  ? LOWER EXTREMITY ANGIOGRAPHY Right 10/20/2020  ? Procedure: Lower Extremity Angiography;  Surgeon: Katha Cabal, MD;  Location: [place] [facility];  Service: Cardiovascular;  Laterality: Right;  ? LOWER EXTREMITY ANGIOGRAPHY Left 01/23/2021  ? Procedure: LOWER EXTREMITY ANGIOGRAPHY;  Surgeon: Katha Cabal, MD;  Location: [place] [facility];  Service: Cardiovascular;  Laterality: Left;  ? LOWER EXTREMITY ANGIOGRAPHY Right 04/10/2021  ? Procedure: LOWER EXTREMITY ANGIOGRAPHY;  Surgeon: Katha Cabal, MD;  Location: [place] [facility];  Service: Cardiovascular;  Laterality: Right;  ? ORIF TOE FRACTURE Right 02/08/2014  ? Procedure: OPEN REDUCTION INTERNAL FIXATION Right METATARSAL  FRACTURE ;  Surgeon: Wylene Simmer, MD;  Location: [place];  Service: Orthopedics;  Laterality: Right;  ? PERIPHERAL VASCULAR CATHETERIZATION N/A 12/28/2015  ? Procedure: Abdominal Aortogram w/Lower Extremity;  Surgeon: Conrad Neeses, MD;  Location: [place] [facility];  Service: Cardiovascular;  Laterality: N/A;  ? RADIOLOGY WITH ANESTHESIA N/A 03/01/2016  ? Procedure: RADIOLOGY WITH ANESTHESIA;  Surgeon: [name], MD;  Location: [place];  Service: Radiology;  Laterality: N/A;  ? VEIN HARVEST Right 02/28/2016  ? Procedure: RIGHT GREATER SAPHENOUS VEIN HARVEST;  Surgeon: Conrad Brewster, MD;  Location: [facility] [place];  Service: Vascular;  Laterality: Right;  ? ? ?There were no vitals filed for this visit. ? ? Subjective Assessment - 11/14/21 1445   ? ? Subjective Patient reports increased pain in BLE feet and booty; Daughter present during session; Daughter reports they have been doing exercises;   ? Patient is accompained by: Family member   ? Pertinent History 80yoF presenting to [place] to address recent gradual progressive decline in mobility, family goals for improved strength, to promote > participation in transfers to wheelchair and  safety with ADL performance. Recent illness includes admission to [facility] on 03/07/21-03/10/21 due to COVID infection c AMS and fever. Pt then underwent outpatient recanalization/limb salvage of RLE 04/10/2021; LLE salvage performed in June 2022. Other PMH: CHF, DM, HTN, CVA, COPD, multiple remote toe amputations, stg 2 sacral ulcer. PLOF includes 3-4 years bed bound status, hoyerlifts to WC, totalA for ADL performance. Pt is cared for by family, as well as professional caregiver services.   ? Limitations Sitting;Walking;Lifting;Standing;House hold activities   ? How long can you sit comfortably? Pt spouse reports 2 hours. Must be careful d/t preventing and trying to heal wounds on bottom.   ? How long can you stand comfortably? unable   ? How long can you walk comfortably? Unable   ? Diagnostic tests 6/21 and 9/06 peripheral vascular catheterizatoin; 04/10/2021 Successful recanalization right lower extremity for limb salvage   ? Patient Stated Goals to strengthen pt's trunk to improve mobility in chair, assist with ADLs   ? Currently in Pain? Yes   ?  Pain Score --   unable to rate, but reports a lot  ? Pain Location Buttocks   ? Pain Orientation Lower   ? Pain Descriptors / Indicators Aching;Sore   ? Pain Type Chronic pain   ? Pain Onset More than a month ago   ? Pain Frequency Intermittent   ? Aggravating Factors  worse with movement   ? Pain Relieving Factors rest   ? Effect of Pain on Daily Activities decreased activity tolerance;   ? Multiple Pain Sites No   ? ?  ?  ? ?  ? ? ? ? ?  ?TREATMENT: ?Seated in front of parallel bars: ?  ?Forward reach tapping small ball to parallel bars x10 reps with cues for trunk control, min A for better trunk control, patient exhibits heavy left lateral lean requiring min A to reorient to midline; Required mod VCs for initiation of exercise and cues for positioning;  ?  ?Seated: ?Hip flexion against small ball for visual target x10 reps, able to exhibit better AROM this session with less  assistance; Often does switch to LAQ requiring cues to resume hip flexion march;  ?  ?Sit<>Stand at parallel bars with patient holding onto bars with BUE x2 attempts, mod A +2, able to achieve full standing position

## 2021-11-16 ENCOUNTER — Ambulatory Visit: Payer: Medicare HMO

## 2021-11-19 ENCOUNTER — Encounter: Payer: Self-pay | Admitting: Physical Therapy

## 2021-11-19 ENCOUNTER — Ambulatory Visit: Payer: Medicare HMO | Admitting: Physical Therapy

## 2021-11-19 DIAGNOSIS — R2681 Unsteadiness on feet: Secondary | ICD-10-CM

## 2021-11-19 DIAGNOSIS — R293 Abnormal posture: Secondary | ICD-10-CM

## 2021-11-19 DIAGNOSIS — R278 Other lack of coordination: Secondary | ICD-10-CM

## 2021-11-19 DIAGNOSIS — R262 Difficulty in walking, not elsewhere classified: Secondary | ICD-10-CM

## 2021-11-19 DIAGNOSIS — R2689 Other abnormalities of gait and mobility: Secondary | ICD-10-CM

## 2021-11-19 DIAGNOSIS — M6281 Muscle weakness (generalized): Secondary | ICD-10-CM | POA: Diagnosis not present

## 2021-11-19 DIAGNOSIS — R269 Unspecified abnormalities of gait and mobility: Secondary | ICD-10-CM

## 2021-11-19 NOTE — Therapy (Signed)
[place] [facility] ?[place], [place], [address] ?Phone: [phone]   Fax:  [phone] ? ?Physical Therapy Treatment ? ?Patient Details  ?Name: [name] ?MRN: [id number] ?Date of Birth: [birth year] ?Referring Provider (PT): Adamo, Elana M ? ? ?Encounter Date: 11/19/2021 ? ? PT End of Session - 11/19/21 1151   ? ? Visit Number 38   ? Number of Visits 65   ? Date for PT Re-Evaluation 01/10/22   ? Authorization Type [employer] Medicare; Medicaid   ? Authorization Time Period 11/05/21-02/01/22   ? Authorization - Visit Number 1   ? Authorization - Number of Visits 24   ? PT Start Time 1146   ? PT Stop Time 1225   ? PT Time Calculation (min) 39 min   ? Equipment Utilized During Treatment Gait belt   ? Activity Tolerance Patient limited by fatigue   ? Behavior During Therapy Flat affect   ? ?  ?  ? ?  ? ? ?Past Medical History:  ?Diagnosis Date  ? Acute heart failure ([place])   ? Aortic stenosis   ? CHF (congestive heart failure) ([place])   ? Complication of anesthesia   ? Hard to wake patient up after having anesthesia  ? Diabetes mellitus without complication ([place])   ? Diabetic neuropathy ([place])   ? Takes Lyrica  ? Edema of both legs   ? Takes Lasix  ? GERD (gastroesophageal reflux disease)   ? High cholesterol   ? HTN (hypertension)   ? Hypertension   ? PAD (peripheral artery disease) ([place])   ? Shortness of breath dyspnea   ? Spasm of back muscles   ? Stroke [facility])   ? Wound, open   ? Right great toe  ? ? ?Past Surgical History:  ?Procedure Laterality Date  ? AMPUTATION TOE Left 06/09/2019  ? Procedure: Left third toe and partial great toe amputation and debridement;  Surgeon: Katha Cabal, MD;  Location: [facility];  Service: Vascular;  Laterality: Left;  ? AMPUTATION TOE Left 04/06/2020  ? Procedure: AMPUTATION LEFT SECOND TOE;  Surgeon: Samara Deist, DPM;  Location: [facility];  Service: Podiatry;  Laterality: Left;  ? BYPASS GRAFT POPLITEAL TO TIBIAL Right  02/28/2016  ? Procedure: BYPASS GRAFT RIGHT BELOW KNEE POPLITEAL TO PERONEAL USING REVERSED RIGHT GREATER SAPHENOUS VEIN;  Surgeon: Conrad [place], MD;  Location: [place];  Service: Vascular;  Laterality: Right;  ? CYST EXCISION    ? Abdomen  ? EYE SURGERY Bilateral   ? Cataract removal  ? INTRAMEDULLARY (IM) NAIL INTERTROCHANTERIC Left 02/04/2014  ? Procedure: INTRAMEDULLARY (IM) NAIL INTERTROCHANTRIC FEMORAL;  Surgeon: Mauri Pole, MD;  Location: [place];  Service: Orthopedics;  Laterality: Left;  ? IR GENERIC HISTORICAL  03/01/2016  ? IR ANGIO INTRA EXTRACRAN SEL COM CAROTID INNOMINATE UNI R MOD SED 03/01/2016 [name], MD [facility]  ? IR GENERIC HISTORICAL  03/01/2016  ? IR ENDOVASC INTRACRANIAL INF OTHER THAN THROMBO ART INC DIAG ANGIO 03/01/2016 [name], MD [facility]  ? IR GENERIC HISTORICAL  03/01/2016  ? IR INTRAVSC STENT CERV CAROTID W/O EMB-PROT MOD SED INC ANGIO 03/01/2016 [name], MD [facility]  ? IR GENERIC HISTORICAL  06/03/2016  ? IR RADIOLOGIST EVAL & MGMT 06/03/2016 [facility]  ? LOWER EXTREMITY ANGIOGRAPHY Left 02/09/2019  ? Procedure: LOWER EXTREMITY ANGIOGRAPHY;  Surgeon: Katha Cabal, MD;  Location: [place] [facility];  Service: Cardiovascular;  Laterality: Left;  ?  LOWER EXTREMITY ANGIOGRAPHY Left 03/10/2019  ? Procedure: LOWER EXTREMITY ANGIOGRAPHY;  Surgeon: Katha Cabal, MD;  Location: [place] [facility];  Service: Cardiovascular;  Laterality: Left;  ? LOWER EXTREMITY ANGIOGRAPHY Left 04/27/2019  ? Procedure: LOWER EXTREMITY ANGIOGRAPHY;  Surgeon: Katha Cabal, MD;  Location: [facility] [facility];  Service: Cardiovascular;  Laterality: Left;  ? LOWER EXTREMITY ANGIOGRAPHY Left 06/08/2019  ? Procedure: Lower Extremity Angiography;  Surgeon: Katha Cabal, MD;  Location: [facility] [facility];  Service: Cardiovascular;  Laterality: Left;  ? LOWER EXTREMITY ANGIOGRAPHY Left 04/03/2020  ? Procedure: Lower Extremity Angiography;  Surgeon: Algernon Huxley, MD;  Location: [facility];  Service: Cardiovascular;  Laterality: Left;  ? LOWER EXTREMITY ANGIOGRAPHY Right 10/20/2020  ? Procedure: Lower Extremity Angiography;  Surgeon: Katha Cabal, MD;  Location: [facility];  Service: Cardiovascular;  Laterality: Right;  ? LOWER EXTREMITY ANGIOGRAPHY Left 01/23/2021  ? Procedure: LOWER EXTREMITY ANGIOGRAPHY;  Surgeon: Katha Cabal, MD;  Location: [facility] [facility];  Service: Cardiovascular;  Laterality: Left;  ? LOWER EXTREMITY ANGIOGRAPHY Right 04/10/2021  ? Procedure: LOWER EXTREMITY ANGIOGRAPHY;  Surgeon: Katha Cabal, MD;  Location: [facility] [facility];  Service: Cardiovascular;  Laterality: Right;  ? ORIF TOE FRACTURE Right 02/08/2014  ? Procedure: OPEN REDUCTION INTERNAL FIXATION Right METATARSAL  FRACTURE ;  Surgeon: Wylene Simmer, MD;  Location: [place];  Service: Orthopedics;  Laterality: Right;  ? PERIPHERAL VASCULAR CATHETERIZATION N/A 12/28/2015  ? Procedure: Abdominal Aortogram w/Lower Extremity;  Surgeon: Conrad [place], MD;  Location: [place] [facility];  Service: Cardiovascular;  Laterality: N/A;  ? RADIOLOGY WITH ANESTHESIA N/A 03/01/2016  ? Procedure: RADIOLOGY WITH ANESTHESIA;  Surgeon: [name], MD;  Location: [place];  Service: Radiology;  Laterality: N/A;  ? VEIN HARVEST Right 02/28/2016  ? Procedure: RIGHT GREATER SAPHENOUS VEIN HARVEST;  Surgeon: Conrad Valle, MD;  Location: [place];  Service: Vascular;  Laterality: Right;  ? ? ?There were no vitals filed for this visit. ? ? Subjective Assessment - 11/19/21 1149   ? ? Subjective Patient reports continued soreness in legs and feet as well as booty. Son present. Reports she has been having a hard time hearing last few days;   ? Patient is accompained by: Family member   ? Pertinent History 80yoF presenting to [place] to address recent gradual progressive decline in mobility, family goals for improved strength, to promote > participation in transfers to wheelchair  and safety with ADL performance. Recent illness includes admission to [facility] on 03/07/21-03/10/21 due to COVID infection c AMS and fever. Pt then underwent outpatient recanalization/limb salvage of RLE 04/10/2021; LLE salvage performed in June 2022. Other PMH: CHF, DM, HTN, CVA, COPD, multiple remote toe amputations, stg 2 sacral ulcer. PLOF includes 3-4 years bed bound status, hoyerlifts to WC, totalA for ADL performance. Pt is cared for by family, as well as professional caregiver services.   ? Limitations Sitting;Walking;Lifting;Standing;House hold activities   ? How long can you sit comfortably? Pt spouse reports 2 hours. Must be careful d/t preventing and trying to heal wounds on bottom.   ? How long can you stand comfortably? unable   ? How long can you walk comfortably? Unable   ? Diagnostic tests 6/21 and 9/06 peripheral vascular catheterizatoin; 04/10/2021 Successful recanalization right lower extremity for limb salvage   ? Patient Stated Goals to strengthen pt's trunk to improve mobility in chair, assist with ADLs   ?  Currently in Pain? Yes   ? Pain Score --   reports a lot of pain, unable to rate;  ? Pain Location Buttocks   ? Pain Orientation Lower   ? Pain Descriptors / Indicators Aching;Sore   ? Pain Type Chronic pain   ? Pain Radiating Towards radiates down BLE to feet   ? Pain Onset More than a month ago   ? Pain Frequency Intermittent   ? Aggravating Factors  worse with movement   ? Pain Relieving Factors rest   ? Effect of Pain on Daily Activities decreased activity tolerance;   ? Multiple Pain Sites No   ? ?  ?  ? ?  ? ? ? ? ? ? ?  ?TREATMENT: ?Seated in front of steps: ?  ?Forward reach tapping small ball bottom step x1 attempt, made easier with tapping cone on bottom step x2 reps with cues for trunk control, min A for better trunk control, Patient does throw ball due to difficulty reaching forward ? ?Seated trunk rotation reaching with ball tapping each arm rest x10 reps each direction with cues for  erect posture. Patient initially exhibits decreased rotation to left side but was able to improve with visual target, does require min A for safety;  ?  ?Seated: ?Hip flexion with toe tap to 6 inch step x10 reps e

## 2021-11-21 ENCOUNTER — Encounter: Payer: Self-pay | Admitting: Physical Therapy

## 2021-11-21 ENCOUNTER — Ambulatory Visit: Payer: Medicare HMO

## 2021-11-21 DIAGNOSIS — R262 Difficulty in walking, not elsewhere classified: Secondary | ICD-10-CM

## 2021-11-21 DIAGNOSIS — M6281 Muscle weakness (generalized): Secondary | ICD-10-CM | POA: Diagnosis not present

## 2021-11-21 DIAGNOSIS — R293 Abnormal posture: Secondary | ICD-10-CM

## 2021-11-21 DIAGNOSIS — R269 Unspecified abnormalities of gait and mobility: Secondary | ICD-10-CM

## 2021-11-21 DIAGNOSIS — R2689 Other abnormalities of gait and mobility: Secondary | ICD-10-CM

## 2021-11-21 DIAGNOSIS — R278 Other lack of coordination: Secondary | ICD-10-CM

## 2021-11-21 DIAGNOSIS — R2681 Unsteadiness on feet: Secondary | ICD-10-CM

## 2021-11-21 NOTE — Therapy (Signed)
[place] [facility] ?[place], [place], [address] ?Phone: [phone]   Fax:  [phone] ? ?Physical Therapy Treatment ? ?Patient Details  ?Name: [name] ?MRN: [id number] ?Date of Birth: [birth year] ?Referring Provider (PT): Adamo, Elana M ? ? ?Encounter Date: 11/21/2021 ? ? PT End of Session - 11/21/21 1517   ? ? Visit Number 39   ? Number of Visits 65   ? Date for PT Re-Evaluation 01/10/22   ? Authorization Type [employer] Medicare; Medicaid   ? Authorization Time Period 11/05/21-02/01/22   ? Authorization - Visit Number 1   ? Authorization - Number of Visits 24   ? PT Start Time 1441   ? PT Stop Time 1513   ? PT Time Calculation (min) 32 min   ? Equipment Utilized During Treatment Gait belt   ? Activity Tolerance Patient limited by fatigue   ? Behavior During Therapy Flat affect   ? ?  ?  ? ?  ? ? ?Past Medical History:  ?Diagnosis Date  ? Acute heart failure ([place])   ? Aortic stenosis   ? CHF (congestive heart failure) ([place])   ? Complication of anesthesia   ? Hard to wake patient up after having anesthesia  ? Diabetes mellitus without complication ([place])   ? Diabetic neuropathy ([place])   ? Takes Lyrica  ? Edema of both legs   ? Takes Lasix  ? GERD (gastroesophageal reflux disease)   ? High cholesterol   ? HTN (hypertension)   ? Hypertension   ? PAD (peripheral artery disease) ([place])   ? Shortness of breath dyspnea   ? Spasm of back muscles   ? Stroke [facility])   ? Wound, open   ? Right great toe  ? ? ?Past Surgical History:  ?Procedure Laterality Date  ? AMPUTATION TOE Left 06/09/2019  ? Procedure: Left third toe and partial great toe amputation and debridement;  Surgeon: Katha Cabal, MD;  Location: [facility];  Service: Vascular;  Laterality: Left;  ? AMPUTATION TOE Left 04/06/2020  ? Procedure: AMPUTATION LEFT SECOND TOE;  Surgeon: Samara Deist, DPM;  Location: [facility];  Service: Podiatry;  Laterality: Left;  ? BYPASS GRAFT POPLITEAL TO TIBIAL Right  02/28/2016  ? Procedure: BYPASS GRAFT RIGHT BELOW KNEE POPLITEAL TO PERONEAL USING REVERSED RIGHT GREATER SAPHENOUS VEIN;  Surgeon: Conrad , MD;  Location: [place];  Service: Vascular;  Laterality: Right;  ? CYST EXCISION    ? Abdomen  ? EYE SURGERY Bilateral   ? Cataract removal  ? INTRAMEDULLARY (IM) NAIL INTERTROCHANTERIC Left 02/04/2014  ? Procedure: INTRAMEDULLARY (IM) NAIL INTERTROCHANTRIC FEMORAL;  Surgeon: Mauri Pole, MD;  Location: [place];  Service: Orthopedics;  Laterality: Left;  ? IR GENERIC HISTORICAL  03/01/2016  ? IR ANGIO INTRA EXTRACRAN SEL COM CAROTID INNOMINATE UNI R MOD SED 03/01/2016 [name], MD [facility]  ? IR GENERIC HISTORICAL  03/01/2016  ? IR ENDOVASC INTRACRANIAL INF OTHER THAN THROMBO ART INC DIAG ANGIO 03/01/2016 [name], MD [facility]  ? IR GENERIC HISTORICAL  03/01/2016  ? IR INTRAVSC STENT CERV CAROTID W/O EMB-PROT MOD SED INC ANGIO 03/01/2016 [name], MD [facility]  ? IR GENERIC HISTORICAL  06/03/2016  ? IR RADIOLOGIST EVAL & MGMT 06/03/2016 [facility]  ? LOWER EXTREMITY ANGIOGRAPHY Left 02/09/2019  ? Procedure: LOWER EXTREMITY ANGIOGRAPHY;  Surgeon: Katha Cabal, MD;  Location: [place] [facility];  Service: Cardiovascular;  Laterality: Left;  ?  LOWER EXTREMITY ANGIOGRAPHY Left 03/10/2019  ? Procedure: LOWER EXTREMITY ANGIOGRAPHY;  Surgeon: Katha Cabal, MD;  Location: [place] [facility];  Service: Cardiovascular;  Laterality: Left;  ? LOWER EXTREMITY ANGIOGRAPHY Left 04/27/2019  ? Procedure: LOWER EXTREMITY ANGIOGRAPHY;  Surgeon: Katha Cabal, MD;  Location: [place] [facility];  Service: Cardiovascular;  Laterality: Left;  ? LOWER EXTREMITY ANGIOGRAPHY Left 06/08/2019  ? Procedure: Lower Extremity Angiography;  Surgeon: Katha Cabal, MD;  Location: [place] [facility];  Service: Cardiovascular;  Laterality: Left;  ? LOWER EXTREMITY ANGIOGRAPHY Left 04/03/2020  ? Procedure: Lower Extremity Angiography;  Surgeon: Algernon Huxley, MD;  Location: [place] [facility];  Service: Cardiovascular;  Laterality: Left;  ? LOWER EXTREMITY ANGIOGRAPHY Right 10/20/2020  ? Procedure: Lower Extremity Angiography;  Surgeon: Katha Cabal, MD;  Location: [place] [facility];  Service: Cardiovascular;  Laterality: Right;  ? LOWER EXTREMITY ANGIOGRAPHY Left 01/23/2021  ? Procedure: LOWER EXTREMITY ANGIOGRAPHY;  Surgeon: Katha Cabal, MD;  Location: [place] [facility];  Service: Cardiovascular;  Laterality: Left;  ? LOWER EXTREMITY ANGIOGRAPHY Right 04/10/2021  ? Procedure: LOWER EXTREMITY ANGIOGRAPHY;  Surgeon: Katha Cabal, MD;  Location: [place] [facility];  Service: Cardiovascular;  Laterality: Right;  ? ORIF TOE FRACTURE Right 02/08/2014  ? Procedure: OPEN REDUCTION INTERNAL FIXATION Right METATARSAL  FRACTURE ;  Surgeon: Wylene Simmer, MD;  Location: [place];  Service: Orthopedics;  Laterality: Right;  ? PERIPHERAL VASCULAR CATHETERIZATION N/A 12/28/2015  ? Procedure: Abdominal Aortogram w/Lower Extremity;  Surgeon: Conrad [place], MD;  Location: [place] [facility];  Service: Cardiovascular;  Laterality: N/A;  ? RADIOLOGY WITH ANESTHESIA N/A 03/01/2016  ? Procedure: RADIOLOGY WITH ANESTHESIA;  Surgeon: [name], MD;  Location: [place];  Service: Radiology;  Laterality: N/A;  ? VEIN HARVEST Right 02/28/2016  ? Procedure: RIGHT GREATER SAPHENOUS VEIN HARVEST;  Surgeon: Conrad Eldridge, MD;  Location: [place];  Service: Vascular;  Laterality: Right;  ? ? ?There were no vitals filed for this visit. ? ? Subjective Assessment - 11/21/21 1444   ? ? Subjective Pt endorses pain in feet and bottom. Son reports intermittent fatigue, and continued diffiuclty hearing.   ? Patient is accompained by: Family member   ? Pertinent History 80yoF presenting to [place] to address recent gradual progressive decline in mobility, family goals for improved strength, to promote > participation in transfers to wheelchair and safety with ADL performance. Recent  illness includes admission to [facility] on 03/07/21-03/10/21 due to COVID infection c AMS and fever. Pt then underwent outpatient recanalization/limb salvage of RLE 04/10/2021; LLE salvage performed in June 2022. Other PMH: CHF, DM, HTN, CVA, COPD, multiple remote toe amputations, stg 2 sacral ulcer. PLOF includes 3-4 years bed bound status, hoyerlifts to WC, totalA for ADL performance. Pt is cared for by family, as well as professional caregiver services.   ? Limitations Sitting;Walking;Lifting;Standing;House hold activities   ? How long can you sit comfortably? Pt spouse reports 2 hours. Must be careful d/t preventing and trying to heal wounds on bottom.   ? How long can you stand comfortably? unable   ? How long can you walk comfortably? Unable   ? Diagnostic tests 6/21 and 9/06 peripheral vascular catheterizatoin; 04/10/2021 Successful recanalization right lower extremity for limb salvage   ? Patient Stated Goals to strengthen pt's trunk to improve mobility in chair, assist with ADLs   ? Pain Onset More than a month ago   ? ?  ?  ? ?  ? ? ?  There.ex:  ? X2 STS attempts requiring maxA+2 and max multimodal cues for LE and UE positioning. Pt tolerating < 10 sec each trial with inability to attain neutral hip positioning maintaining flexed posture at hips and torso.  ? ? Forward ball reaches for anterior weight shift (core engagement and skin integrity): x3 with limited forward translation  ? ? Forward ball passes with R/L rotation for lateral weight shift for core stability and skin integrity: x5/direction  ? ? Alternating LAQ's to colored porcupine ball: x10/LE, with max VC's for reaching TKE  ? ? Contralateral  cone grabs with ipsilateral UE supported on w/c: x8 cones/UE for oblique engagement and lateral weight shift for skin integrity  ? ? ? ? ? ?Overall session significantly limited due to poor pt compliance. Pt frequently attempting to pretend she is sleeping and frequently voicing she did not want to be at PT despite max  encouragement from PT and pt's son for limited participation. Focus of session on using visual cues and tasks for improved core stability and weight shifting for skin integrity. ? ? PT Education - 11/21/21 151

## 2021-11-26 ENCOUNTER — Ambulatory Visit: Payer: Medicare HMO | Admitting: Physical Therapy

## 2021-11-26 ENCOUNTER — Encounter: Payer: Self-pay | Admitting: Physical Therapy

## 2021-11-26 DIAGNOSIS — R2681 Unsteadiness on feet: Secondary | ICD-10-CM

## 2021-11-26 DIAGNOSIS — R293 Abnormal posture: Secondary | ICD-10-CM

## 2021-11-26 DIAGNOSIS — R262 Difficulty in walking, not elsewhere classified: Secondary | ICD-10-CM

## 2021-11-26 DIAGNOSIS — R2689 Other abnormalities of gait and mobility: Secondary | ICD-10-CM

## 2021-11-26 DIAGNOSIS — R278 Other lack of coordination: Secondary | ICD-10-CM

## 2021-11-26 DIAGNOSIS — M6281 Muscle weakness (generalized): Secondary | ICD-10-CM | POA: Diagnosis not present

## 2021-11-26 DIAGNOSIS — R269 Unspecified abnormalities of gait and mobility: Secondary | ICD-10-CM

## 2021-11-26 NOTE — Therapy (Signed)
[place] [facility] ?[place], [place], [address] ?Phone: [phone]   Fax:  [phone] ? ?Physical Therapy Treatment ?Physical Therapy Progress Note ? ? ?Dates of reporting period  10/01/21   to   11/26/21 ? ? ?Patient Details  ?Name: [name] ?MRN: [id number] ?Date of Birth: 1941/05/04 ?Referring Provider (PT): Adamo, Elana M ? ? ?Encounter Date: 11/26/2021 ? ? PT End of Session - 11/28/21 6948   ? ? Visit Number 40   ? Number of Visits 65   ? Date for PT Re-Evaluation 01/10/22   ? Authorization Type [employer] Medicare; Medicaid   ? Authorization Time Period 11/05/21-02/01/22   ? Authorization - Visit Number 1   ? Authorization - Number of Visits 24   ? PT Start Time 1432   ? PT Stop Time 1515   ? PT Time Calculation (min) 43 min   ? Equipment Utilized During Treatment Gait belt   ? Activity Tolerance Patient limited by fatigue   ? Behavior During Therapy Flat affect   ? ?  ?  ? ?  ? ? ? ? ?Past Medical History:  ?Diagnosis Date  ? Acute heart failure ([place])   ? Aortic stenosis   ? CHF (congestive heart failure) ([place])   ? Complication of anesthesia   ? Hard to wake patient up after having anesthesia  ? Diabetes mellitus without complication ([place])   ? Diabetic neuropathy ([place])   ? Takes Lyrica  ? Edema of both legs   ? Takes Lasix  ? GERD (gastroesophageal reflux disease)   ? High cholesterol   ? HTN (hypertension)   ? Hypertension   ? PAD (peripheral artery disease) ([place])   ? Shortness of breath dyspnea   ? Spasm of back muscles   ? Stroke [facility])   ? Wound, open   ? Right great toe  ? ? ?Past Surgical History:  ?Procedure Laterality Date  ? AMPUTATION TOE Left 06/09/2019  ? Procedure: Left third toe and partial great toe amputation and debridement;  Surgeon: Katha Cabal, MD;  Location: [facility];  Service: Vascular;  Laterality: Left;  ? AMPUTATION TOE Left 04/06/2020  ? Procedure: AMPUTATION LEFT SECOND TOE;  Surgeon: Samara Deist, DPM;  Location: [facility];  Service: Podiatry;  Laterality: Left;  ? BYPASS GRAFT POPLITEAL TO TIBIAL Right 02/28/2016  ? Procedure: BYPASS GRAFT RIGHT BELOW KNEE POPLITEAL TO PERONEAL USING REVERSED RIGHT GREATER SAPHENOUS VEIN;  Surgeon: Conrad [place]ne, MD;  Location: [place];  Service: Vascular;  Laterality: Right;  ? CYST EXCISION    ? Abdomen  ? EYE SURGERY Bilateral   ? Cataract removal  ? INTRAMEDULLARY (IM) NAIL INTERTROCHANTERIC Left 02/04/2014  ? Procedure: INTRAMEDULLARY (IM) NAIL INTERTROCHANTRIC FEMORAL;  Surgeon: Mauri Pole, MD;  Location: [place];  Service: Orthopedics;  Laterality: Left;  ? IR GENERIC HISTORICAL  03/01/2016  ? IR ANGIO INTRA EXTRACRAN SEL COM CAROTID INNOMINATE UNI R MOD SED 03/01/2016 [name], MD [facility]  ? IR GENERIC HISTORICAL  03/01/2016  ? IR ENDOVASC INTRACRANIAL INF OTHER THAN THROMBO ART INC DIAG ANGIO 03/01/2016 [name], MD [facility]  ? IR GENERIC HISTORICAL  03/01/2016  ? IR INTRAVSC STENT CERV CAROTID W/O EMB-PROT MOD SED INC ANGIO 03/01/2016 [name], MD [facility]  ? IR GENERIC HISTORICAL  06/03/2016  ? IR RADIOLOGIST EVAL & MGMT 06/03/2016 [facility]  ? LOWER EXTREMITY ANGIOGRAPHY Left 02/09/2019  ? Procedure: LOWER  EXTREMITY ANGIOGRAPHY;  Surgeon: Katha Cabal, MD;  Location: [place] [facility];  Service: Cardiovascular;  Laterality: Left;  ? LOWER EXTREMITY ANGIOGRAPHY Left 03/10/2019  ? Procedure: LOWER EXTREMITY ANGIOGRAPHY;  Surgeon: Katha Cabal, MD;  Location: [place] [facility];  Service: Cardiovascular;  Laterality: Left;  ? LOWER EXTREMITY ANGIOGRAPHY Left 04/27/2019  ? Procedure: LOWER EXTREMITY ANGIOGRAPHY;  Surgeon: Katha Cabal, MD;  Location: [place] [facility];  Service: Cardiovascular;  Laterality: Left;  ? LOWER EXTREMITY ANGIOGRAPHY Left 06/08/2019  ? Procedure: Lower Extremity Angiography;  Surgeon: Katha Cabal, MD;  Location: [place] [facility];  Service: Cardiovascular;  Laterality: Left;  ? LOWER EXTREMITY  ANGIOGRAPHY Left 04/03/2020  ? Procedure: Lower Extremity Angiography;  Surgeon: Algernon Huxley, MD;  Location: [place] [facility];  Service: Cardiovascular;  Laterality: Left;  ? LOWER EXTREMITY ANGIOGRAPHY Right 10/20/2020  ? Procedure: Lower Extremity Angiography;  Surgeon: Katha Cabal, MD;  Location: [place] [facility];  Service: Cardiovascular;  Laterality: Right;  ? LOWER EXTREMITY ANGIOGRAPHY Left 01/23/2021  ? Procedure: LOWER EXTREMITY ANGIOGRAPHY;  Surgeon: Katha Cabal, MD;  Location: [place] [facility];  Service: Cardiovascular;  Laterality: Left;  ? LOWER EXTREMITY ANGIOGRAPHY Right 04/10/2021  ? Procedure: LOWER EXTREMITY ANGIOGRAPHY;  Surgeon: Katha Cabal, MD;  Location: [place] [facility];  Service: Cardiovascular;  Laterality: Right;  ? ORIF TOE FRACTURE Right 02/08/2014  ? Procedure: OPEN REDUCTION INTERNAL FIXATION Right METATARSAL  FRACTURE ;  Surgeon: Wylene Simmer, MD;  Location: [place];  Service: Orthopedics;  Laterality: Right;  ? PERIPHERAL VASCULAR CATHETERIZATION N/A 12/28/2015  ? Procedure: Abdominal Aortogram w/Lower Extremity;  Surgeon: Conrad [place]Place, MD;  Location: [place] [facility];  Service: Cardiovascular;  Laterality: N/A;  ? RADIOLOGY WITH ANESTHESIA N/A 03/01/2016  ? Procedure: RADIOLOGY WITH ANESTHESIA;  Surgeon: [name], MD;  Location: [place];  Service: Radiology;  Laterality: N/A;  ? VEIN HARVEST Right 02/28/2016  ? Procedure: RIGHT GREATER SAPHENOUS VEIN HARVEST;  Surgeon: Conrad [name], MD;  Location: [place];  Service: Vascular;  Laterality: Right;  ? ? ?There were no vitals filed for this visit. ? ? Subjective Assessment - 11/26/21 1438   ? ? Subjective Patient reports continued pain in BLE feet/legs. Her son is present at evaluation. Reports working on some exercise at home.   ? Patient is accompained by: Family member   ? Pertinent History 80yoF presenting to [place] to address recent gradual progressive decline in mobility, family goals for improved  strength, to promote > participation in transfers to wheelchair and safety with ADL performance. Recent illness includes admission to [facility] on 03/07/21-03/10/21 due to COVID infection c AMS and fever. Pt then underwent outpatient recanalization/limb salvage of RLE 04/10/2021; LLE salvage performed in June 2022. Other PMH: CHF, DM, HTN, CVA, COPD, multiple remote toe amputations, stg 2 sacral ulcer. PLOF includes 3-4 years bed bound status, hoyerlifts to WC, totalA for ADL performance. Pt is cared for by family, as well as professional caregiver services.   ? Limitations Sitting;Walking;Lifting;Standing;House hold activities   ? How long can you sit comfortably? Pt spouse reports 2 hours. Must be careful d/t preventing and trying to heal wounds on bottom.   ? How long can you stand comfortably? unable   ? How long can you walk comfortably? Unable   ? Diagnostic tests 6/21 and 9/06 peripheral vascular catheterizatoin; 04/10/2021 Successful recanalization right lower extremity for limb salvage   ? Patient  Stated Goals to strengthen pt's trunk to improve mobility in chair, assist with ADLs   ? Currently in Pain? Yes   ? Pain Score --   unable to verbalize  ? Pain Location Leg   ? Pain Orientation Lower   ? Pain Descriptors / Indicators Aching;Sore   ? Pain Type Chronic pain   ? Pain Onset More than a month ago   ? Pain Frequency Intermittent   ? Aggravating Factors  worse with movement   ? Pain Relieving Factors rest   ? Effect of Pain on Daily Activities decreased activity tolerance;   ? Multiple Pain Sites No   ? ?  ?  ? ?  ? ? ? ? ? ? ?  ?TREATMENT: ?Seated in front of steps: ?  ?Forward reach tapping small ball bottom step x1 attempt,  ? ?Sitting unsupported x2-3 min with intermittent therapist assist; Patient exhibits heavy posterior lean requiring max encouragement for forward weight shift, able to hold for 30-45 sec without loss of balance; ?  ?Seated trunk rotation reaching with ball tapping each arm rest x10 reps each  direction with cues for erect posture. Patient initially exhibits decreased rotation to left side but was able to improve with visual target, does require min A for safety;  ?  ?Seated: ?BLE ball leg l

## 2021-11-28 ENCOUNTER — Encounter: Payer: Self-pay | Admitting: Physical Therapy

## 2021-11-28 ENCOUNTER — Ambulatory Visit: Payer: Medicare HMO | Admitting: Physical Therapy

## 2021-11-28 DIAGNOSIS — M6281 Muscle weakness (generalized): Secondary | ICD-10-CM | POA: Diagnosis not present

## 2021-11-28 DIAGNOSIS — R293 Abnormal posture: Secondary | ICD-10-CM

## 2021-11-28 DIAGNOSIS — R2689 Other abnormalities of gait and mobility: Secondary | ICD-10-CM

## 2021-11-28 DIAGNOSIS — R2681 Unsteadiness on feet: Secondary | ICD-10-CM

## 2021-11-28 DIAGNOSIS — R278 Other lack of coordination: Secondary | ICD-10-CM

## 2021-11-28 DIAGNOSIS — R269 Unspecified abnormalities of gait and mobility: Secondary | ICD-10-CM

## 2021-11-28 DIAGNOSIS — R262 Difficulty in walking, not elsewhere classified: Secondary | ICD-10-CM

## 2021-11-28 NOTE — Therapy (Signed)
[place] [facility] ?[place], [place], [address] ?Phone: [phone]   Fax:  [phone] ? ?Physical Therapy Treatment ? ?Patient Details  ?Name: [name] ?MRN: [id number] ?Date of Birth: [birth year] ?Referring Provider (PT): Adamo, Elana M ? ? ?Encounter Date: 11/28/2021 ? ? PT End of Session - 11/28/21 1315   ? ? Visit Number 41   ? Number of Visits 65   ? Date for PT Re-Evaluation 01/10/22   ? Authorization Type [employer] Medicare; Medicaid   ? Authorization Time Period 11/05/21-02/01/22   ? Authorization - Visit Number 1   ? Authorization - Number of Visits 24   ? PT Start Time 1302   ? PT Stop Time 1335   ? PT Time Calculation (min) 33 min   ? Equipment Utilized During Treatment Gait belt   ? Activity Tolerance Patient limited by fatigue   ? Behavior During Therapy Flat affect   ? ?  ?  ? ?  ? ? ?Past Medical History:  ?Diagnosis Date  ? Acute heart failure ([place])   ? Aortic stenosis   ? CHF (congestive heart failure) ([place])   ? Complication of anesthesia   ? Hard to wake patient up after having anesthesia  ? Diabetes mellitus without complication ([place])   ? Diabetic neuropathy ([place])   ? Takes Lyrica  ? Edema of both legs   ? Takes Lasix  ? GERD (gastroesophageal reflux disease)   ? High cholesterol   ? HTN (hypertension)   ? Hypertension   ? PAD (peripheral artery disease) ([place])   ? Shortness of breath dyspnea   ? Spasm of back muscles   ? Stroke [facility])   ? Wound, open   ? Right great toe  ? ? ?Past Surgical History:  ?Procedure Laterality Date  ? AMPUTATION TOE Left 06/09/2019  ? Procedure: Left third toe and partial great toe amputation and debridement;  Surgeon: Katha Cabal, MD;  Location: [facility];  Service: Vascular;  Laterality: Left;  ? AMPUTATION TOE Left 04/06/2020  ? Procedure: AMPUTATION LEFT SECOND TOE;  Surgeon: Samara Deist, DPM;  Location: [facility];  Service: Podiatry;  Laterality: Left;  ? BYPASS GRAFT POPLITEAL TO TIBIAL Right  02/28/2016  ? Procedure: BYPASS GRAFT RIGHT BELOW KNEE POPLITEAL TO PERONEAL USING REVERSED RIGHT GREATER SAPHENOUS VEIN;  Surgeon: Conrad [place], MD;  Location: [place];  Service: Vascular;  Laterality: Right;  ? CYST EXCISION    ? Abdomen  ? EYE SURGERY Bilateral   ? Cataract removal  ? INTRAMEDULLARY (IM) NAIL INTERTROCHANTERIC Left 02/04/2014  ? Procedure: INTRAMEDULLARY (IM) NAIL INTERTROCHANTRIC FEMORAL;  Surgeon: Mauri Pole, MD;  Location: [place];  Service: Orthopedics;  Laterality: Left;  ? IR GENERIC HISTORICAL  03/01/2016  ? IR ANGIO INTRA EXTRACRAN SEL COM CAROTID INNOMINATE UNI R MOD SED 03/01/2016 [name], MD [facility]  ? IR GENERIC HISTORICAL  03/01/2016  ? IR ENDOVASC INTRACRANIAL INF OTHER THAN THROMBO ART INC DIAG ANGIO 03/01/2016 [name], MD [facility]  ? IR GENERIC HISTORICAL  03/01/2016  ? IR INTRAVSC STENT CERV CAROTID W/O EMB-PROT MOD SED INC ANGIO 03/01/2016 [name], MD [facility]  ? IR GENERIC HISTORICAL  06/03/2016  ? IR RADIOLOGIST EVAL & MGMT 06/03/2016 [facility]  ? LOWER EXTREMITY ANGIOGRAPHY Left 02/09/2019  ? Procedure: LOWER EXTREMITY ANGIOGRAPHY;  Surgeon: Katha Cabal, MD;  Location: [place] [facility];  Service: Cardiovascular;  Laterality: Left;  ?  LOWER EXTREMITY ANGIOGRAPHY Left 03/10/2019  ? Procedure: LOWER EXTREMITY ANGIOGRAPHY;  Surgeon: Katha Cabal, MD;  Location: [place] [facility];  Service: Cardiovascular;  Laterality: Left;  ? LOWER EXTREMITY ANGIOGRAPHY Left 04/27/2019  ? Procedure: LOWER EXTREMITY ANGIOGRAPHY;  Surgeon: Katha Cabal, MD;  Location: [place] [facility];  Service: Cardiovascular;  Laterality: Left;  ? LOWER EXTREMITY ANGIOGRAPHY Left 06/08/2019  ? Procedure: Lower Extremity Angiography;  Surgeon: Katha Cabal, MD;  Location: [place] [facility];  Service: Cardiovascular;  Laterality: Left;  ? LOWER EXTREMITY ANGIOGRAPHY Left 04/03/2020  ? Procedure: Lower Extremity Angiography;  Surgeon: Algernon Huxley, MD;  Location: [place] [facility];  Service: Cardiovascular;  Laterality: Left;  ? LOWER EXTREMITY ANGIOGRAPHY Right 10/20/2020  ? Procedure: Lower Extremity Angiography;  Surgeon: Katha Cabal, MD;  Location: [place] [facility];  Service: Cardiovascular;  Laterality: Right;  ? LOWER EXTREMITY ANGIOGRAPHY Left 01/23/2021  ? Procedure: LOWER EXTREMITY ANGIOGRAPHY;  Surgeon: Katha Cabal, MD;  Location: [place] [facility];  Service: Cardiovascular;  Laterality: Left;  ? LOWER EXTREMITY ANGIOGRAPHY Right 04/10/2021  ? Procedure: LOWER EXTREMITY ANGIOGRAPHY;  Surgeon: Katha Cabal, MD;  Location: [place] [facility];  Service: Cardiovascular;  Laterality: Right;  ? ORIF TOE FRACTURE Right 02/08/2014  ? Procedure: OPEN REDUCTION INTERNAL FIXATION Right METATARSAL  FRACTURE ;  Surgeon: Wylene Simmer, MD;  Location: [place];  Service: Orthopedics;  Laterality: Right;  ? PERIPHERAL VASCULAR CATHETERIZATION N/A 12/28/2015  ? Procedure: Abdominal Aortogram w/Lower Extremity;  Surgeon: Conrad [place], MD;  Location: [place] [facility];  Service: Cardiovascular;  Laterality: N/A;  ? RADIOLOGY WITH ANESTHESIA N/A 03/01/2016  ? Procedure: RADIOLOGY WITH ANESTHESIA;  Surgeon: [name], MD;  Location: [place];  Service: Radiology;  Laterality: N/A;  ? VEIN HARVEST Right 02/28/2016  ? Procedure: RIGHT GREATER SAPHENOUS VEIN HARVEST;  Surgeon: Conrad [place], MD;  Location: [place];  Service: Vascular;  Laterality: Right;  ? ? ?There were no vitals filed for this visit. ? ? Subjective Assessment - 11/28/21 1306   ? ? Subjective Patient reports continued pain in BLE feet/legs. Her son is present at session; Reports working on some exercise at home. Son reports he gave her some tylenol prior to session; Son reports that patient didn't get her alert medication until later this morning which could be contributing to fatigue;   ? Patient is accompained by: Family member   ? Pertinent History 80yoF presenting to [place] to address recent gradual progressive decline in mobility, family goals for improved strength, to promote > participation in transfers to wheelchair and safety with ADL performance. Recent illness includes admission to [facility] on 03/07/21-03/10/21 due to COVID infection c AMS and fever. Pt then underwent outpatient recanalization/limb salvage of RLE 04/10/2021; LLE salvage performed in June 2022. Other PMH: CHF, DM, HTN, CVA, COPD, multiple remote toe amputations, stg 2 sacral ulcer. PLOF includes 3-4 years bed bound status, hoyerlifts to WC, totalA for ADL performance. Pt is cared for by family, as well as professional caregiver services.   ? Limitations Sitting;Walking;Lifting;Standing;House hold activities   ? How long can you sit comfortably? Pt spouse reports 2 hours. Must be careful d/t preventing and trying to heal wounds on bottom.   ? How long can you stand comfortably? unable   ? How long can you walk comfortably? Unable   ? Diagnostic tests 6/21 and 9/06 peripheral vascular catheterizatoin; 04/10/2021 Successful recanalization right lower extremity  for limb salvage   ? Patient Stated Goals to strengthen pt's trunk to improve mobility in chair, assist with ADLs   ? Currently in Pain? Yes   ? Pain Score --   unable to rate, reports a lot of pain;  ? Pain Location Leg   ? Pain Orientation Right;Left   ? Pain Descriptors / Indicators Aching;Sore   ? Pain Type Chronic pain   ? Pain Onset More than a month ago   ? Pain Frequency Intermittent   ? Aggravating Factors  worse with movement   ? Pain Relieving Factors rest   ? Effect of Pain on Daily Activities decreased activity tolerance;   ? Multiple Pain Sites No   ? ?  ?  ? ?  ? ? ? ? ? ?  ?  ?  ?TREATMENT: ?Seated in front of steps: ?  ?Forward reach tapping small ball to target approximately 5-10 inch forward reach x10 reps; ?  ?Sitting unsupported x2-3 min with intermittent therapist assist; Patient exhibits heavy posterior lean requiring max encouragement for  forward weight shift, able to hold for 10 sec without loss of balance; ?  ?Sit<>Stand at steps with patient holding onto railing with BUE x2 attempts, mod A +2, able to achieve full standing position with better g

## 2021-12-03 ENCOUNTER — Ambulatory Visit: Payer: Medicare HMO | Attending: Family Medicine | Admitting: Physical Therapy

## 2021-12-03 ENCOUNTER — Encounter: Payer: Self-pay | Admitting: Physical Therapy

## 2021-12-03 DIAGNOSIS — R269 Unspecified abnormalities of gait and mobility: Secondary | ICD-10-CM | POA: Diagnosis present

## 2021-12-03 DIAGNOSIS — M6281 Muscle weakness (generalized): Secondary | ICD-10-CM | POA: Diagnosis present

## 2021-12-03 DIAGNOSIS — R262 Difficulty in walking, not elsewhere classified: Secondary | ICD-10-CM | POA: Insufficient documentation

## 2021-12-03 DIAGNOSIS — R2689 Other abnormalities of gait and mobility: Secondary | ICD-10-CM | POA: Diagnosis present

## 2021-12-03 DIAGNOSIS — R2681 Unsteadiness on feet: Secondary | ICD-10-CM | POA: Diagnosis present

## 2021-12-03 DIAGNOSIS — R293 Abnormal posture: Secondary | ICD-10-CM | POA: Insufficient documentation

## 2021-12-03 DIAGNOSIS — R278 Other lack of coordination: Secondary | ICD-10-CM | POA: Diagnosis present

## 2021-12-03 NOTE — Therapy (Signed)
[place] [facility] ?[place], [place], [address] ?Phone: [phone]   Fax:  [phone] ? ?Physical Therapy Treatment ? ?Patient Details  ?Name: [name] ?MRN: [id number] ?Date of Birth: Feb 17, 1941 ?Referring Provider (PT): Adamo, Elana M ? ? ?Encounter Date: 12/03/2021 ? ? PT End of Session - 12/03/21 1132   ? ? Visit Number 42   ? Number of Visits 65   ? Date for PT Re-Evaluation 01/10/22   ? Authorization Type [employer] Medicare; Medicaid   ? Authorization Time Period 11/05/21-02/01/22   ? Authorization - Visit Number 1   ? Authorization - Number of Visits 24   ? PT Start Time 1146   ? PT Stop Time 1230   ? PT Time Calculation (min) 44 min   ? Equipment Utilized During Treatment Gait belt   ? Activity Tolerance Patient limited by fatigue   ? Behavior During Therapy Flat affect   ? ?  ?  ? ?  ? ? ?Past Medical History:  ?Diagnosis Date  ? Acute heart failure ([place])   ? Aortic stenosis   ? CHF (congestive heart failure) ([place])   ? Complication of anesthesia   ? Hard to wake patient up after having anesthesia  ? Diabetes mellitus without complication ([place])   ? Diabetic neuropathy ([place])   ? Takes Lyrica  ? Edema of both legs   ? Takes Lasix  ? GERD (gastroesophageal reflux disease)   ? High cholesterol   ? HTN (hypertension)   ? Hypertension   ? PAD (peripheral artery disease) ([place])   ? Shortness of breath dyspnea   ? Spasm of back muscles   ? Stroke [facility])   ? Wound, open   ? Right great toe  ? ? ?Past Surgical History:  ?Procedure Laterality Date  ? AMPUTATION TOE Left 06/09/2019  ? Procedure: Left third toe and partial great toe amputation and debridement;  Surgeon: Katha Cabal, MD;  Location: [facility];  Service: Vascular;  Laterality: Left;  ? AMPUTATION TOE Left 04/06/2020  ? Procedure: AMPUTATION LEFT SECOND TOE;  Surgeon: Samara Deist, DPM;  Location: [facility];  Service: Podiatry;  Laterality: Left;  ? BYPASS GRAFT POPLITEAL TO TIBIAL Right  02/28/2016  ? Procedure: BYPASS GRAFT RIGHT BELOW KNEE POPLITEAL TO PERONEAL USING REVERSED RIGHT GREATER SAPHENOUS VEIN;  Surgeon: Conrad [place], MD;  Location: [place];  Service: Vascular;  Laterality: Right;  ? CYST EXCISION    ? Abdomen  ? EYE SURGERY Bilateral   ? Cataract removal  ? INTRAMEDULLARY (IM) NAIL INTERTROCHANTERIC Left 02/04/2014  ? Procedure: INTRAMEDULLARY (IM) NAIL INTERTROCHANTRIC FEMORAL;  Surgeon: Mauri Pole, MD;  Location: [place];  Service: Orthopedics;  Laterality: Left;  ? IR GENERIC HISTORICAL  03/01/2016  ? IR ANGIO INTRA EXTRACRAN SEL COM CAROTID INNOMINATE UNI R MOD SED 03/01/2016 [name], MD [facility]  ? IR GENERIC HISTORICAL  03/01/2016  ? IR ENDOVASC INTRACRANIAL INF OTHER THAN THROMBO ART INC DIAG ANGIO 03/01/2016 [name], MD [facility]  ? IR GENERIC HISTORICAL  03/01/2016  ? IR INTRAVSC STENT CERV CAROTID W/O EMB-PROT MOD SED INC ANGIO 03/01/2016 [name], MD [facility]  ? IR GENERIC HISTORICAL  06/03/2016  ? IR RADIOLOGIST EVAL & MGMT 06/03/2016 [facility]  ? LOWER EXTREMITY ANGIOGRAPHY Left 02/09/2019  ? Procedure: LOWER EXTREMITY ANGIOGRAPHY;  Surgeon: Katha Cabal, MD;  Location: [place] [facility];  Service: Cardiovascular;  Laterality: Left;  ?  LOWER EXTREMITY ANGIOGRAPHY Left 03/10/2019  ? Procedure: LOWER EXTREMITY ANGIOGRAPHY;  Surgeon: Katha Cabal, MD;  Location: [place] [facility];  Service: Cardiovascular;  Laterality: Left;  ? LOWER EXTREMITY ANGIOGRAPHY Left 04/27/2019  ? Procedure: LOWER EXTREMITY ANGIOGRAPHY;  Surgeon: Katha Cabal, MD;  Location: [place] [facility];  Service: Cardiovascular;  Laterality: Left;  ? LOWER EXTREMITY ANGIOGRAPHY Left 06/08/2019  ? Procedure: Lower Extremity Angiography;  Surgeon: Katha Cabal, MD;  Location: [place] [facility];  Service: Cardiovascular;  Laterality: Left;  ? LOWER EXTREMITY ANGIOGRAPHY Left 04/03/2020  ? Procedure: Lower Extremity Angiography;  Surgeon: Algernon Huxley, MD;  Location: [place] [facility];  Service: Cardiovascular;  Laterality: Left;  ? LOWER EXTREMITY ANGIOGRAPHY Right 10/20/2020  ? Procedure: Lower Extremity Angiography;  Surgeon: Katha Cabal, MD;  Location: [place] [facility];  Service: Cardiovascular;  Laterality: Right;  ? LOWER EXTREMITY ANGIOGRAPHY Left 01/23/2021  ? Procedure: LOWER EXTREMITY ANGIOGRAPHY;  Surgeon: Katha Cabal, MD;  Location: [place] [facility];  Service: Cardiovascular;  Laterality: Left;  ? LOWER EXTREMITY ANGIOGRAPHY Right 04/10/2021  ? Procedure: LOWER EXTREMITY ANGIOGRAPHY;  Surgeon: Katha Cabal, MD;  Location: [place] [facility];  Service: Cardiovascular;  Laterality: Right;  ? ORIF TOE FRACTURE Right 02/08/2014  ? Procedure: OPEN REDUCTION INTERNAL FIXATION Right METATARSAL  FRACTURE ;  Surgeon: Wylene Simmer, MD;  Location: [place];  Service: Orthopedics;  Laterality: Right;  ? PERIPHERAL VASCULAR CATHETERIZATION N/A 12/28/2015  ? Procedure: Abdominal Aortogram w/Lower Extremity;  Surgeon: Conrad [place], MD;  Location: [place] [facility];  Service: Cardiovascular;  Laterality: N/A;  ? RADIOLOGY WITH ANESTHESIA N/A 03/01/2016  ? Procedure: RADIOLOGY WITH ANESTHESIA;  Surgeon: [name], MD;  Location: [place];  Service: Radiology;  Laterality: N/A;  ? VEIN HARVEST Right 02/28/2016  ? Procedure: RIGHT GREATER SAPHENOUS VEIN HARVEST;  Surgeon: Conrad [place], MD;  Location: [place];  Service: Vascular;  Laterality: Right;  ? ? ?There were no vitals filed for this visit. ? ? Subjective Assessment - 12/03/21 1153   ? ? Subjective Pt reports doing okay. "My booty hurts and my legs" Pt presents to therapy with her daughter;   ? Patient is accompained by: Family member   ? Pertinent History 80yoF presenting to [place] to address recent gradual progressive decline in mobility, family goals for improved strength, to promote > participation in transfers to wheelchair and safety with ADL performance. Recent illness  includes admission to [facility] on 03/07/21-03/10/21 due to COVID infection c AMS and fever. Pt then underwent outpatient recanalization/limb salvage of RLE 04/10/2021; LLE salvage performed in June 2022. Other PMH: CHF, DM, HTN, CVA, COPD, multiple remote toe amputations, stg 2 sacral ulcer. PLOF includes 3-4 years bed bound status, hoyerlifts to WC, totalA for ADL performance. Pt is cared for by family, as well as professional caregiver services.   ? Limitations Sitting;Walking;Lifting;Standing;House hold activities   ? How long can you sit comfortably? Pt spouse reports 2 hours. Must be careful d/t preventing and trying to heal wounds on bottom.   ? How long can you stand comfortably? unable   ? How long can you walk comfortably? Unable   ? Diagnostic tests 6/21 and 9/06 peripheral vascular catheterizatoin; 04/10/2021 Successful recanalization right lower extremity for limb salvage   ? Patient Stated Goals to strengthen pt's trunk to improve mobility in chair, assist with ADLs   ? Currently in Pain? Yes   ? Pain  Score --   unable to rate, reports hurts a lot  ? Pain Location Buttocks   ? Pain Orientation Right;Left   ? Pain Descriptors / Indicators Aching;Sore   ? Pain Type Chronic pain   ? Pain Onset More than a month ago   ? Pain Frequency Intermittent   ? Aggravating Factors  worse with movement/certain positions   ? Pain Relieving Factors rest   ? Effect of Pain on Daily Activities decreased activity tolerance;   ? Multiple Pain Sites No   ? ?  ?  ? ?  ? ? ? ?TREATMENT: ?Seated in front of parallel bars: ?  ?Instructed patient in forward weight shift, sitting forward in chair. ?Patient able to initiate forward weight shift to get feet on floor. She required several attempts to hold forward weight shift away from back of wheelchair. ?PT repositioned patient's jacket for comfort and donned gait belt; ? ?Patient inside parallel bars: ?PT adjusted bars to lower position for comfort ?Instructed patient in sit<>Stand in  parallel bars with patient pulling self up using BUE on railings: x3 reps ?Required mod A +1 with max VCs for proper positioning. Initially she exhibits posterior weight shift with flexed posture and with cues was able t

## 2021-12-05 ENCOUNTER — Encounter: Payer: Self-pay | Admitting: Physical Therapy

## 2021-12-05 ENCOUNTER — Other Ambulatory Visit: Payer: Self-pay | Admitting: Family Medicine

## 2021-12-05 ENCOUNTER — Ambulatory Visit: Payer: Medicare HMO | Admitting: Physical Therapy

## 2021-12-05 DIAGNOSIS — Z1231 Encounter for screening mammogram for malignant neoplasm of breast: Secondary | ICD-10-CM

## 2021-12-05 DIAGNOSIS — R293 Abnormal posture: Secondary | ICD-10-CM

## 2021-12-05 DIAGNOSIS — M6281 Muscle weakness (generalized): Secondary | ICD-10-CM | POA: Diagnosis not present

## 2021-12-05 DIAGNOSIS — R2689 Other abnormalities of gait and mobility: Secondary | ICD-10-CM

## 2021-12-05 DIAGNOSIS — R2681 Unsteadiness on feet: Secondary | ICD-10-CM

## 2021-12-05 DIAGNOSIS — R262 Difficulty in walking, not elsewhere classified: Secondary | ICD-10-CM

## 2021-12-05 DIAGNOSIS — R278 Other lack of coordination: Secondary | ICD-10-CM

## 2021-12-05 DIAGNOSIS — R269 Unspecified abnormalities of gait and mobility: Secondary | ICD-10-CM

## 2021-12-05 NOTE — Therapy (Signed)
[place] ?[facility] ?[place], [place], [address] ?Phone: [phone]   Fax:  [phone] ? ?Physical Therapy Treatment ? ?Patient Details  ?Name: [name] ?MRN: [id number] ?Date of Birth: 02-13-1941 ?Referring Provider (PT): Adamo, Elana M ? ? ?Encounter Date: 12/05/2021 ? ? PT End of Session - 12/06/21 0820   ? ? Visit Number 57   ? Number of Visits 65   ? Date for PT Re-Evaluation 01/10/22   ? Authorization Type [employer] Medicare; Medicaid   ? Authorization Time Period 11/05/21-02/01/22   ? Authorization - Visit Number 1   ? Authorization - Number of Visits 24   ? PT Start Time 1019   ? PT Stop Time 1100   ? PT Time Calculation (min) 41 min   ? Equipment Utilized During Treatment Gait belt   ? Activity Tolerance Patient limited by fatigue   ? Behavior During Therapy Flat affect   ? ?  ?  ? ?  ? ? ?Past Medical History:  ?Diagnosis Date  ? Acute heart failure ([place])   ? Aortic stenosis   ? CHF (congestive heart failure) ([place])   ? Complication of anesthesia   ? Hard to wake patient up after having anesthesia  ? Diabetes mellitus without complication ([place])   ? Diabetic neuropathy ([place])   ? Takes Lyrica  ? Edema of both legs   ? Takes Lasix  ? GERD (gastroesophageal reflux disease)   ? High cholesterol   ? HTN (hypertension)   ? Hypertension   ? PAD (peripheral artery disease) ([place])   ? Shortness of breath dyspnea   ? Spasm of back muscles   ? Stroke [facility])   ? Wound, open   ? Right great toe  ? ? ?Past Surgical History:  ?Procedure Laterality Date  ? AMPUTATION TOE Left 06/09/2019  ? Procedure: Left third toe and partial great toe amputation and debridement;  Surgeon: Katha Cabal, MD;  Location: [facility];  Service: Vascular;  Laterality: Left;  ? AMPUTATION TOE Left 04/06/2020  ? Procedure: AMPUTATION LEFT SECOND TOE;  Surgeon: Samara Deist, DPM;  Location: [facility];  Service: Podiatry;  Laterality: Left;  ? BYPASS GRAFT POPLITEAL TO TIBIAL Right  02/28/2016  ? Procedure: BYPASS GRAFT RIGHT BELOW KNEE POPLITEAL TO PERONEAL USING REVERSED RIGHT GREATER SAPHENOUS VEIN;  Surgeon: Conrad Staunton, MD;  Location: [place] [facility];  Service: Vascular;  Laterality: Right;  ? CYST EXCISION    ? Abdomen  ? EYE SURGERY Bilateral   ? Cataract removal  ? INTRAMEDULLARY (IM) NAIL INTERTROCHANTERIC Left 02/04/2014  ? Procedure: INTRAMEDULLARY (IM) NAIL INTERTROCHANTRIC FEMORAL;  Surgeon: Mauri Pole, MD;  Location: [place];  Service: Orthopedics;  Laterality: Left;  ? IR GENERIC HISTORICAL  03/01/2016  ? IR ANGIO INTRA EXTRACRAN SEL COM CAROTID INNOMINATE UNI R MOD SED 03/01/2016 [name], MD [facility]  ? IR GENERIC HISTORICAL  03/01/2016  ? IR ENDOVASC INTRACRANIAL INF OTHER THAN THROMBO ART INC DIAG ANGIO 03/01/2016 [name], MD [facility]  ? IR GENERIC HISTORICAL  03/01/2016  ? IR INTRAVSC STENT CERV CAROTID W/O EMB-PROT MOD SED INC ANGIO 03/01/2016 [name], MD [facility]  ? IR GENERIC HISTORICAL  06/03/2016  ? IR RADIOLOGIST EVAL & MGMT 06/03/2016 [facility]  ? LOWER EXTREMITY ANGIOGRAPHY Left 02/09/2019  ? Procedure: LOWER EXTREMITY ANGIOGRAPHY;  Surgeon: Katha Cabal, MD;  Location: [facility];  Service: Cardiovascular;  Laterality: Left;  ?  LOWER EXTREMITY ANGIOGRAPHY Left 03/10/2019  ? Procedure: LOWER EXTREMITY ANGIOGRAPHY;  Surgeon: Katha Cabal, MD;  Location: [place] [facility];  Service: Cardiovascular;  Laterality: Left;  ? LOWER EXTREMITY ANGIOGRAPHY Left 04/27/2019  ? Procedure: LOWER EXTREMITY ANGIOGRAPHY;  Surgeon: Katha Cabal, MD;  Location: [place] [facility];  Service: Cardiovascular;  Laterality: Left;  ? LOWER EXTREMITY ANGIOGRAPHY Left 06/08/2019  ? Procedure: Lower Extremity Angiography;  Surgeon: Katha Cabal, MD;  Location: [place] [facility];  Service: Cardiovascular;  Laterality: Left;  ? LOWER EXTREMITY ANGIOGRAPHY Left 04/03/2020  ? Procedure: Lower Extremity Angiography;  Surgeon: Algernon Huxley, MD;  Location: [facility] [facility];  Service: Cardiovascular;  Laterality: Left;  ? LOWER EXTREMITY ANGIOGRAPHY Right 10/20/2020  ? Procedure: Lower Extremity Angiography;  Surgeon: Katha Cabal, MD;  Location: [place];  Service: Cardiovascular;  Laterality: Right;  ? LOWER EXTREMITY ANGIOGRAPHY Left 01/23/2021  ? Procedure: LOWER EXTREMITY ANGIOGRAPHY;  Surgeon: Katha Cabal, MD;  Location: [place];  Service: Cardiovascular;  Laterality: Left;  ? LOWER EXTREMITY ANGIOGRAPHY Right 04/10/2021  ? Procedure: LOWER EXTREMITY ANGIOGRAPHY;  Surgeon: Katha Cabal, MD;  Location: [place] [facility];  Service: Cardiovascular;  Laterality: Right;  ? ORIF TOE FRACTURE Right 02/08/2014  ? Procedure: OPEN REDUCTION INTERNAL FIXATION Right METATARSAL  FRACTURE ;  Surgeon: Wylene Simmer, MD;  Location: [place];  Service: Orthopedics;  Laterality: Right;  ? PERIPHERAL VASCULAR CATHETERIZATION N/A 12/28/2015  ? Procedure: Abdominal Aortogram w/Lower Extremity;  Surgeon: Conrad [place], MD;  Location: [place] [facility];  Service: Cardiovascular;  Laterality: N/A;  ? RADIOLOGY WITH ANESTHESIA N/A 03/01/2016  ? Procedure: RADIOLOGY WITH ANESTHESIA;  Surgeon: [name], MD;  Location: [place];  Service: Radiology;  Laterality: N/A;  ? VEIN HARVEST Right 02/28/2016  ? Procedure: RIGHT GREATER SAPHENOUS VEIN HARVEST;  Surgeon: Conrad [place], MD;  Location: [place];  Service: Vascular;  Laterality: Right;  ? ? ?There were no vitals filed for this visit. ? ? Subjective Assessment - 12/05/21 1023   ? ? Subjective Patient reports her right knee pain today. her son reports they have been using heat/ice and tylenol;   ? Patient is accompained by: Family member   ? Pertinent History 80yoF presenting to [facility] to address recent gradual progressive decline in mobility, family goals for improved strength, to promote > participation in transfers to wheelchair and safety with ADL performance. Recent  illness includes admission to [facility] on 03/07/21-03/10/21 due to COVID infection c AMS and fever. Pt then underwent outpatient recanalization/limb salvage of RLE 04/10/2021; LLE salvage performed in June 2022. Other PMH: CHF, DM, HTN, CVA, COPD, multiple remote toe amputations, stg 2 sacral ulcer. PLOF includes 3-4 years bed bound status, hoyerlifts to WC, totalA for ADL performance. Pt is cared for by family, as well as professional caregiver services.   ? Limitations Sitting;Walking;Lifting;Standing;House hold activities   ? How long can you sit comfortably? Pt spouse reports 2 hours. Must be careful d/t preventing and trying to heal wounds on bottom.   ? How long can you stand comfortably? unable   ? How long can you walk comfortably? Unable   ? Diagnostic tests 6/21 and 9/06 peripheral vascular catheterizatoin; 04/10/2021 Successful recanalization right lower extremity for limb salvage   ? Patient Stated Goals to strengthen pt's trunk to improve mobility in chair, assist with ADLs   ? Currently in Pain? Yes   ? Pain  Score --   reports "a lot of pain" in right leg  ? Pain Location Leg   ? Pain Orientation Right   ? Pain Descriptors / Indicators Sharp;Throbbing   ? Pain Type Acute pain   ? Pain Onset In the past 7 days   ? Pain Frequency Intermittent   ? Aggravating Factors  worse with movement/certain positions   ? Pain Relieving Factors rest/heat/ice   ? Effect of Pain on Daily Activities decreased activity tolerance;   ? Multiple Pain Sites No   ? ?  ?  ? ?  ? ? ? ? ? ?TREATMENT: ?Seated in front of parallel bars: ?  ?There Act: ?Instructed patient in forward weight shift, sitting forward in chair. ?Patient able to initiate forward weight shift to get feet on floor. She required several attempts to hold forward weight shift away from back of wheelchair. ?PT repositioned patient's jacket for comfort and donned gait belt; ? ?Ex: ?Seated: ?RLE LAQ x10 reps with cues to avoid painful ROM ?LLE LAQ x10 reps ?Provided visual  cues to increase ROM for better strengthening;  ?Attempted ankle DF/PF but patient had difficulty and complained of pain; ? ?Manual Therapy: ?PT identified increased tightness in BLE particularly in bilateral quads

## 2021-12-10 ENCOUNTER — Ambulatory Visit: Payer: Medicare HMO | Admitting: Physical Therapy

## 2021-12-10 ENCOUNTER — Encounter: Payer: Self-pay | Admitting: Physical Therapy

## 2021-12-10 DIAGNOSIS — R2689 Other abnormalities of gait and mobility: Secondary | ICD-10-CM

## 2021-12-10 DIAGNOSIS — R2681 Unsteadiness on feet: Secondary | ICD-10-CM

## 2021-12-10 DIAGNOSIS — M6281 Muscle weakness (generalized): Secondary | ICD-10-CM | POA: Diagnosis not present

## 2021-12-10 DIAGNOSIS — R262 Difficulty in walking, not elsewhere classified: Secondary | ICD-10-CM

## 2021-12-10 DIAGNOSIS — R269 Unspecified abnormalities of gait and mobility: Secondary | ICD-10-CM

## 2021-12-10 DIAGNOSIS — R293 Abnormal posture: Secondary | ICD-10-CM

## 2021-12-10 DIAGNOSIS — R278 Other lack of coordination: Secondary | ICD-10-CM

## 2021-12-10 NOTE — Therapy (Signed)
[place] ?[facility] ?[place], [place], [address] ?Phone: [phone]   Fax:  [phone] ? ?Physical Therapy Treatment ? ?Patient Details  ?Name: [name] ?MRN: [id number] ?Date of Birth: [birth year] ?Referring Provider (PT): Adamo, Elana M ? ? ?Encounter Date: 12/10/2021 ? ? PT End of Session - 12/10/21 1304   ? ? Visit Number 44   ? Number of Visits 65   ? Date for PT Re-Evaluation 01/10/22   ? Authorization Type [employer] Medicare; Medicaid   ? Authorization Time Period 11/05/21-02/01/22   ? Authorization - Visit Number 1   ? Authorization - Number of Visits 24   ? PT Start Time 1302   ? PT Stop Time 4081   ? PT Time Calculation (min) 43 min   ? Equipment Utilized During Treatment Gait belt   ? Activity Tolerance Patient limited by fatigue   ? Behavior During Therapy Flat affect   ? ?  ?  ? ?  ? ? ?Past Medical History:  ?Diagnosis Date  ? Acute heart failure ([place])   ? Aortic stenosis   ? CHF (congestive heart failure) ([place])   ? Complication of anesthesia   ? Hard to wake patient up after having anesthesia  ? Diabetes mellitus without complication ([place])   ? Diabetic neuropathy ([place])   ? Takes Lyrica  ? Edema of both legs   ? Takes Lasix  ? GERD (gastroesophageal reflux disease)   ? High cholesterol   ? HTN (hypertension)   ? Hypertension   ? PAD (peripheral artery disease) ([place])   ? Shortness of breath dyspnea   ? Spasm of back muscles   ? Stroke [facility])   ? Wound, open   ? Right great toe  ? ? ?Past Surgical History:  ?Procedure Laterality Date  ? AMPUTATION TOE Left 06/09/2019  ? Procedure: Left third toe and partial great toe amputation and debridement;  Surgeon: Katha Cabal, MD;  Location: [facility];  Service: Vascular;  Laterality: Left;  ? AMPUTATION TOE Left 04/06/2020  ? Procedure: AMPUTATION LEFT SECOND TOE;  Surgeon: Samara Deist, DPM;  Location: [facility];  Service: Podiatry;  Laterality: Left;  ? BYPASS GRAFT POPLITEAL TO TIBIAL Right  02/28/2016  ? Procedure: BYPASS GRAFT RIGHT BELOW KNEE POPLITEAL TO PERONEAL USING REVERSED RIGHT GREATER SAPHENOUS VEIN;  Surgeon: Conrad Victor, MD;  Location: [place];  Service: Vascular;  Laterality: Right;  ? CYST EXCISION    ? Abdomen  ? EYE SURGERY Bilateral   ? Cataract removal  ? INTRAMEDULLARY (IM) NAIL INTERTROCHANTERIC Left 02/04/2014  ? Procedure: INTRAMEDULLARY (IM) NAIL INTERTROCHANTRIC FEMORAL;  Surgeon: Mauri Pole, MD;  Location: [place];  Service: Orthopedics;  Laterality: Left;  ? IR GENERIC HISTORICAL  03/01/2016  ? IR ANGIO INTRA EXTRACRAN SEL COM CAROTID INNOMINATE UNI R MOD SED 03/01/2016 [name], MD [facility]  ? IR GENERIC HISTORICAL  03/01/2016  ? IR ENDOVASC INTRACRANIAL INF OTHER THAN THROMBO ART INC DIAG ANGIO 03/01/2016 [name], MD [facility]  ? IR GENERIC HISTORICAL  03/01/2016  ? IR INTRAVSC STENT CERV CAROTID W/O EMB-PROT MOD SED INC ANGIO 03/01/2016 [name], MD [facility]  ? IR GENERIC HISTORICAL  06/03/2016  ? IR RADIOLOGIST EVAL & MGMT 06/03/2016 [facility]  ? LOWER EXTREMITY ANGIOGRAPHY Left 02/09/2019  ? Procedure: LOWER EXTREMITY ANGIOGRAPHY;  Surgeon: Katha Cabal, MD;  Location: [place] [facility];  Service: Cardiovascular;  Laterality: Left;  ?  LOWER EXTREMITY ANGIOGRAPHY Left 03/10/2019  ? Procedure: LOWER EXTREMITY ANGIOGRAPHY;  Surgeon: Katha Cabal, MD;  Location: [place] [facility];  Service: Cardiovascular;  Laterality: Left;  ? LOWER EXTREMITY ANGIOGRAPHY Left 04/27/2019  ? Procedure: LOWER EXTREMITY ANGIOGRAPHY;  Surgeon: Katha Cabal, MD;  Location: [place] [facility];  Service: Cardiovascular;  Laterality: Left;  ? LOWER EXTREMITY ANGIOGRAPHY Left 06/08/2019  ? Procedure: Lower Extremity Angiography;  Surgeon: Katha Cabal, MD;  Location: [facility] [facility];  Service: Cardiovascular;  Laterality: Left;  ? LOWER EXTREMITY ANGIOGRAPHY Left 04/03/2020  ? Procedure: Lower Extremity Angiography;  Surgeon: Algernon Huxley, MD;  Location: [facility] [facility];  Service: Cardiovascular;  Laterality: Left;  ? LOWER EXTREMITY ANGIOGRAPHY Right 10/20/2020  ? Procedure: Lower Extremity Angiography;  Surgeon: Katha Cabal, MD;  Location: [facility] [facility];  Service: Cardiovascular;  Laterality: Right;  ? LOWER EXTREMITY ANGIOGRAPHY Left 01/23/2021  ? Procedure: LOWER EXTREMITY ANGIOGRAPHY;  Surgeon: Katha Cabal, MD;  Location: [place] [facility];  Service: Cardiovascular;  Laterality: Left;  ? LOWER EXTREMITY ANGIOGRAPHY Right 04/10/2021  ? Procedure: LOWER EXTREMITY ANGIOGRAPHY;  Surgeon: Katha Cabal, MD;  Location: [place] [facility];  Service: Cardiovascular;  Laterality: Right;  ? ORIF TOE FRACTURE Right 02/08/2014  ? Procedure: OPEN REDUCTION INTERNAL FIXATION Right METATARSAL  FRACTURE ;  Surgeon: Wylene Simmer, MD;  Location: [place];  Service: Orthopedics;  Laterality: Right;  ? PERIPHERAL VASCULAR CATHETERIZATION N/A 12/28/2015  ? Procedure: Abdominal Aortogram w/Lower Extremity;  Surgeon: Conrad [place], MD;  Location: [place] [facility];  Service: Cardiovascular;  Laterality: N/A;  ? RADIOLOGY WITH ANESTHESIA N/A 03/01/2016  ? Procedure: RADIOLOGY WITH ANESTHESIA;  Surgeon: [name], MD;  Location: [place];  Service: Radiology;  Laterality: N/A;  ? VEIN HARVEST Right 02/28/2016  ? Procedure: RIGHT GREATER SAPHENOUS VEIN HARVEST;  Surgeon: Conrad [place], MD;  Location: [place];  Service: Vascular;  Laterality: Right;  ? ? ?There were no vitals filed for this visit. ? ? Subjective Assessment - 12/10/21 1309   ? ? Subjective Patient reports feeling pain all over today. "My behind hurts, my feet, my legs, my stomach" She reports feeling bad today;   ? Patient is accompained by: Family member   ? Pertinent History 80yoF presenting to [place] to address recent gradual progressive decline in mobility, family goals for improved strength, to promote > participation in transfers to wheelchair and safety with ADL  performance. Recent illness includes admission to [facility] on 03/07/21-03/10/21 due to COVID infection c AMS and fever. Pt then underwent outpatient recanalization/limb salvage of RLE 04/10/2021; LLE salvage performed in June 2022. Other PMH: CHF, DM, HTN, CVA, COPD, multiple remote toe amputations, stg 2 sacral ulcer. PLOF includes 3-4 years bed bound status, hoyerlifts to WC, totalA for ADL performance. Pt is cared for by family, as well as professional caregiver services.   ? Limitations Sitting;Walking;Lifting;Standing;House hold activities   ? How long can you sit comfortably? Pt spouse reports 2 hours. Must be careful d/t preventing and trying to heal wounds on bottom.   ? How long can you stand comfortably? unable   ? How long can you walk comfortably? Unable   ? Diagnostic tests 6/21 and 9/06 peripheral vascular catheterizatoin; 04/10/2021 Successful recanalization right lower extremity for limb salvage   ? Patient Stated Goals to strengthen pt's trunk to improve mobility in chair, assist with ADLs   ? Currently in Pain? Yes   ?  Pain Score --   "a lot"  ? Pain Location Leg   ? Pain Orientation Right   ? Pain Descriptors / Indicators Aching;Sore   ? Pain Type Acute pain   ? Pain Onset In the past 7 days   ? Pain Frequency Intermittent   ? Aggravating Factors  worse with movement/certain positions   ? Pain Relieving Factors rest/heat/ice   ? Effect of Pain on Daily Activities decreased activity tolerance;   ? Multiple Pain Sites No   ? ?  ?  ? ?  ? ? ? ?  ?  ?TREATMENT: ?Seated in front of parallel bars: ?  ?There Act: ?Instructed patient in forward weight shift, sitting forward in chair. ?Patient able to initiate forward weight shift to get feet on floor. She required several attempts to hold forward weight shift away from back of wheelchair. ?PT repositioned patient's jacket for comfort and donned gait belt; ?  ?Ex: ?Seated: ?RLE LAQ x10 reps with cues to avoid painful ROM ?LLE LAQ x10 reps ?Provided visual cues to  increase ROM for better strengthening;  ?Attempted ankle DF/PF but patient had difficulty and complained of pain; ?  ?Manual Therapy: ?PT identified increased tightness in BLE particularly in bilateral quads ?[place]

## 2021-12-12 ENCOUNTER — Ambulatory Visit: Payer: Medicare HMO | Admitting: Physical Therapy

## 2021-12-12 ENCOUNTER — Encounter: Payer: Self-pay | Admitting: Physical Therapy

## 2021-12-12 DIAGNOSIS — R2689 Other abnormalities of gait and mobility: Secondary | ICD-10-CM

## 2021-12-12 DIAGNOSIS — R2681 Unsteadiness on feet: Secondary | ICD-10-CM

## 2021-12-12 DIAGNOSIS — R278 Other lack of coordination: Secondary | ICD-10-CM

## 2021-12-12 DIAGNOSIS — R293 Abnormal posture: Secondary | ICD-10-CM

## 2021-12-12 DIAGNOSIS — M6281 Muscle weakness (generalized): Secondary | ICD-10-CM

## 2021-12-12 DIAGNOSIS — R262 Difficulty in walking, not elsewhere classified: Secondary | ICD-10-CM

## 2021-12-12 DIAGNOSIS — R269 Unspecified abnormalities of gait and mobility: Secondary | ICD-10-CM

## 2021-12-12 NOTE — Therapy (Signed)
[facility] ?[place], [place], [address] ?Phone: [phone]   Fax:  [phone] ? ?Physical Therapy Treatment ? ?Patient Details  ?Name: [name] ?MRN: [id number] ?Date of Birth: May 18, 1941 ?Referring Provider (PT): Adamo, Elana M ? ? ?Encounter Date: 12/12/2021 ? ? PT End of Session - 12/12/21 1315   ? ? Visit Number 68   ? Number of Visits 65   ? Date for PT Re-Evaluation 01/10/22   ? Authorization Type [employer] Medicare; Medicaid   ? Authorization Time Period 11/05/21-02/01/22   ? Authorization - Visit Number 1   ? Authorization - Number of Visits 24   ? PT Start Time 1148   ? PT Stop Time 7253   ? PT Time Calculation (min) 41 min   ? Equipment Utilized During Treatment Gait belt   ? Activity Tolerance Patient limited by fatigue   ? Behavior During Therapy Flat affect   ? ?  ?  ? ?  ? ? ?Past Medical History:  ?Diagnosis Date  ? Acute heart failure ([place])   ? Aortic stenosis   ? CHF (congestive heart failure) ([place])   ? Complication of anesthesia   ? Hard to wake patient up after having anesthesia  ? Diabetes mellitus without complication ([place])   ? Diabetic neuropathy ([place])   ? Takes Lyrica  ? Edema of both legs   ? Takes Lasix  ? GERD (gastroesophageal reflux disease)   ? High cholesterol   ? HTN (hypertension)   ? Hypertension   ? PAD (peripheral artery disease) ([place])   ? Shortness of breath dyspnea   ? Spasm of back muscles   ? Stroke [facility])   ? Wound, open   ? Right great toe  ? ? ?Past Surgical History:  ?Procedure Laterality Date  ? AMPUTATION TOE Left 06/09/2019  ? Procedure: Left third toe and partial great toe amputation and debridement;  Surgeon: Katha Cabal, MD;  Location: [facility];  Service: Vascular;  Laterality: Left;  ? AMPUTATION TOE Left 04/06/2020  ? Procedure: AMPUTATION LEFT SECOND TOE;  Surgeon: Samara Deist, DPM;  Location: [facility];  Service: Podiatry;  Laterality: Left;  ? BYPASS GRAFT POPLITEAL TO TIBIAL Right  02/28/2016  ? Procedure: BYPASS GRAFT RIGHT BELOW KNEE POPLITEAL TO PERONEAL USING REVERSED RIGHT GREATER SAPHENOUS VEIN;  Surgeon: Conrad Pulpotio Bareas, MD;  Location: [place];  Service: Vascular;  Laterality: Right;  ? CYST EXCISION    ? Abdomen  ? EYE SURGERY Bilateral   ? Cataract removal  ? INTRAMEDULLARY (IM) NAIL INTERTROCHANTERIC Left 02/04/2014  ? Procedure: INTRAMEDULLARY (IM) NAIL INTERTROCHANTRIC FEMORAL;  Surgeon: Mauri Pole, MD;  Location: [place];  Service: Orthopedics;  Laterality: Left;  ? IR GENERIC HISTORICAL  03/01/2016  ? IR ANGIO INTRA EXTRACRAN SEL COM CAROTID INNOMINATE UNI R MOD SED 03/01/2016 [name], MD [facility]  ? IR GENERIC HISTORICAL  03/01/2016  ? IR ENDOVASC INTRACRANIAL INF OTHER THAN THROMBO ART INC DIAG ANGIO 03/01/2016 [name], MD [facility]  ? IR GENERIC HISTORICAL  03/01/2016  ? IR INTRAVSC STENT CERV CAROTID W/O EMB-PROT MOD SED INC ANGIO 03/01/2016 [name], MD [facility]  ? IR GENERIC HISTORICAL  06/03/2016  ? IR RADIOLOGIST EVAL & MGMT 06/03/2016 [facility]  ? LOWER EXTREMITY ANGIOGRAPHY Left 02/09/2019  ? Procedure: LOWER EXTREMITY ANGIOGRAPHY;  Surgeon: Katha Cabal, MD;  Location: [place] [facility];  Service: Cardiovascular;  Laterality: Left;  ?  LOWER EXTREMITY ANGIOGRAPHY Left 03/10/2019  ? Procedure: LOWER EXTREMITY ANGIOGRAPHY;  Surgeon: Katha Cabal, MD;  Location: [place] [facility];  Service: Cardiovascular;  Laterality: Left;  ? LOWER EXTREMITY ANGIOGRAPHY Left 04/27/2019  ? Procedure: LOWER EXTREMITY ANGIOGRAPHY;  Surgeon: Katha Cabal, MD;  Location: [place] [facility];  Service: Cardiovascular;  Laterality: Left;  ? LOWER EXTREMITY ANGIOGRAPHY Left 06/08/2019  ? Procedure: Lower Extremity Angiography;  Surgeon: Katha Cabal, MD;  Location: [place] [facility];  Service: Cardiovascular;  Laterality: Left;  ? LOWER EXTREMITY ANGIOGRAPHY Left 04/03/2020  ? Procedure: Lower Extremity Angiography;  Surgeon: Algernon Huxley, MD;  Location: [place] [facility];  Service: Cardiovascular;  Laterality: Left;  ? LOWER EXTREMITY ANGIOGRAPHY Right 10/20/2020  ? Procedure: Lower Extremity Angiography;  Surgeon: Katha Cabal, MD;  Location: [place] [facility];  Service: Cardiovascular;  Laterality: Right;  ? LOWER EXTREMITY ANGIOGRAPHY Left 01/23/2021  ? Procedure: LOWER EXTREMITY ANGIOGRAPHY;  Surgeon: Katha Cabal, MD;  Location: [place] [facility];  Service: Cardiovascular;  Laterality: Left;  ? LOWER EXTREMITY ANGIOGRAPHY Right 04/10/2021  ? Procedure: LOWER EXTREMITY ANGIOGRAPHY;  Surgeon: Katha Cabal, MD;  Location: [place] [facility];  Service: Cardiovascular;  Laterality: Right;  ? ORIF TOE FRACTURE Right 02/08/2014  ? Procedure: OPEN REDUCTION INTERNAL FIXATION Right METATARSAL  FRACTURE ;  Surgeon: Wylene Simmer, MD;  Location: [place];  Service: Orthopedics;  Laterality: Right;  ? PERIPHERAL VASCULAR CATHETERIZATION N/A 12/28/2015  ? Procedure: Abdominal Aortogram w/Lower Extremity;  Surgeon: Conrad [place], MD;  Location: [place] [facility];  Service: Cardiovascular;  Laterality: N/A;  ? RADIOLOGY WITH ANESTHESIA N/A 03/01/2016  ? Procedure: RADIOLOGY WITH ANESTHESIA;  Surgeon: [name], MD;  Location: [place];  Service: Radiology;  Laterality: N/A;  ? VEIN HARVEST Right 02/28/2016  ? Procedure: RIGHT GREATER SAPHENOUS VEIN HARVEST;  Surgeon: Conrad [place], MD;  Location: [place];  Service: Vascular;  Laterality: Right;  ? ? ?There were no vitals filed for this visit. ? ? Subjective Assessment - 12/12/21 1310   ? ? Subjective Patient reports feeling pain all over today. However she is not complaining as much as previous sessions. Husband came with patient today and reports she has been doing exercises at home. Her son did get her a rolling stick and he has been using it on her legs to help reduce tightness.   ? Patient is accompained by: Family member   ? Pertinent History 80yoF presenting to [facility] to  address recent gradual progressive decline in mobility, family goals for improved strength, to promote > participation in transfers to wheelchair and safety with ADL performance. Recent illness includes admission to [facility] on 03/07/21-03/10/21 due to COVID infection c AMS and fever. Pt then underwent outpatient recanalization/limb salvage of RLE 04/10/2021; LLE salvage performed in June 2022. Other PMH: CHF, DM, HTN, CVA, COPD, multiple remote toe amputations, stg 2 sacral ulcer. PLOF includes 3-4 years bed bound status, hoyerlifts to WC, totalA for ADL performance. Pt is cared for by family, as well as professional caregiver services.   ? Limitations Sitting;Walking;Lifting;Standing;House hold activities   ? How long can you sit comfortably? Pt spouse reports 2 hours. Must be careful d/t preventing and trying to heal wounds on bottom.   ? How long can you stand comfortably? unable   ? How long can you walk comfortably? Unable   ? Diagnostic tests 6/21 and 9/06 peripheral vascular catheterizatoin; 04/10/2021 Successful recanalization  right lower extremity for limb salvage   ? Patient Stated Goals to strengthen pt's trunk to improve mobility in chair, assist with ADLs   ? Currently in Pain? Yes   ? Pain Score --   unable to rate  ? Pain Location Leg   ? Pain Orientation Right   ? Pain Descriptors / Indicators Aching;Sore   ? Pain Type Acute pain   ? Pain Onset 1 to 4 weeks ago   ? Pain Frequency Intermittent   ? Aggravating Factors  worse with movement/certain positions   ? Pain Relieving Factors rest/heat/ice   ? Effect of Pain on Daily Activities decreased activity tolerance;   ? Multiple Pain Sites No   ? ?  ?  ? ?  ? ? ? ? ? ?  ?  ?TREATMENT: ?Seated in front of parallel bars: ?  ? ?Manual Therapy: ?PT identified increased tightness in BLE particularly in bilateral quads ?Utilized rolling stick to reduce tightness with soft tissue massage x5-10 min with good tolerance;  ? ?  ?Ex: ?Seated: ?Hamstring curl green tband x10  reps each LE, decreased ROM with verbal and tactile cues to increase ROM for better strengthening ?Hip flexion march x10 reps each LE with visual cues to increase ROM; required AAROM to increase ROM;  ?Provided visu

## 2021-12-17 ENCOUNTER — Ambulatory Visit: Payer: Medicare HMO | Admitting: Physical Therapy

## 2021-12-17 ENCOUNTER — Encounter: Payer: Self-pay | Admitting: Physical Therapy

## 2021-12-17 DIAGNOSIS — R293 Abnormal posture: Secondary | ICD-10-CM

## 2021-12-17 DIAGNOSIS — R2689 Other abnormalities of gait and mobility: Secondary | ICD-10-CM

## 2021-12-17 DIAGNOSIS — R269 Unspecified abnormalities of gait and mobility: Secondary | ICD-10-CM

## 2021-12-17 DIAGNOSIS — R262 Difficulty in walking, not elsewhere classified: Secondary | ICD-10-CM

## 2021-12-17 DIAGNOSIS — M6281 Muscle weakness (generalized): Secondary | ICD-10-CM | POA: Diagnosis not present

## 2021-12-17 DIAGNOSIS — R278 Other lack of coordination: Secondary | ICD-10-CM

## 2021-12-17 DIAGNOSIS — R2681 Unsteadiness on feet: Secondary | ICD-10-CM

## 2021-12-17 NOTE — Therapy (Signed)
[facility] ?[place], [place], [address] ?Phone: [phone]   Fax:  [phone] ? ?Physical Therapy Treatment ? ?Patient Details  ?Name: [name] ?MRN: [id number] ?Date of Birth: 1940/12/04 ?Referring Provider (PT): Adamo, Elana M ? ? ?Encounter Date: 12/17/2021 ? ? PT End of Session - 12/17/21 1146   ? ? Visit Number 21   ? Number of Visits 65   ? Date for PT Re-Evaluation 01/10/22   ? Authorization Type [employer] Medicare; Medicaid   ? Authorization Time Period 11/05/21-02/01/22   ? Authorization - Visit Number 1   ? Authorization - Number of Visits 24   ? PT Start Time 1146   ? PT Stop Time 1226   ? PT Time Calculation (min) 40 min   ? Equipment Utilized During Treatment Gait belt   ? Activity Tolerance Patient limited by fatigue   ? Behavior During Therapy Flat affect   ? ?  ?  ? ?  ? ? ?Past Medical History:  ?Diagnosis Date  ? Acute heart failure ([place])   ? Aortic stenosis   ? CHF (congestive heart failure) ([place])   ? Complication of anesthesia   ? Hard to wake patient up after having anesthesia  ? Diabetes mellitus without complication ([place])   ? Diabetic neuropathy ([place])   ? Takes Lyrica  ? Edema of both legs   ? Takes Lasix  ? GERD (gastroesophageal reflux disease)   ? High cholesterol   ? HTN (hypertension)   ? Hypertension   ? PAD (peripheral artery disease) ([place])   ? Shortness of breath dyspnea   ? Spasm of back muscles   ? Stroke [facility])   ? Wound, open   ? Right great toe  ? ? ?Past Surgical History:  ?Procedure Laterality Date  ? AMPUTATION TOE Left 06/09/2019  ? Procedure: Left third toe and partial great toe amputation and debridement;  Surgeon: Katha Cabal, MD;  Location: [facility];  Service: Vascular;  Laterality: Left;  ? AMPUTATION TOE Left 04/06/2020  ? Procedure: AMPUTATION LEFT SECOND TOE;  Surgeon: Samara Deist, DPM;  Location: [facility];  Service: Podiatry;  Laterality: Left;  ? BYPASS GRAFT POPLITEAL TO TIBIAL Right  02/28/2016  ? Procedure: BYPASS GRAFT RIGHT BELOW KNEE POPLITEAL TO PERONEAL USING REVERSED RIGHT GREATER SAPHENOUS VEIN;  Surgeon: Conrad [place], MD;  Location: [place];  Service: Vascular;  Laterality: Right;  ? CYST EXCISION    ? Abdomen  ? EYE SURGERY Bilateral   ? Cataract removal  ? INTRAMEDULLARY (IM) NAIL INTERTROCHANTERIC Left 02/04/2014  ? Procedure: INTRAMEDULLARY (IM) NAIL INTERTROCHANTRIC FEMORAL;  Surgeon: Mauri Pole, MD;  Location: [place];  Service: Orthopedics;  Laterality: Left;  ? IR GENERIC HISTORICAL  03/01/2016  ? IR ANGIO INTRA EXTRACRAN SEL COM CAROTID INNOMINATE UNI R MOD SED 03/01/2016 [name], MD [facility]  ? IR GENERIC HISTORICAL  03/01/2016  ? IR ENDOVASC INTRACRANIAL INF OTHER THAN THROMBO ART INC DIAG ANGIO 03/01/2016 [name], MD [facility]  ? IR GENERIC HISTORICAL  03/01/2016  ? IR INTRAVSC STENT CERV CAROTID W/O EMB-PROT MOD SED INC ANGIO 03/01/2016 [name], MD [facility]  ? IR GENERIC HISTORICAL  06/03/2016  ? IR RADIOLOGIST EVAL & MGMT 06/03/2016 [facility]  ? LOWER EXTREMITY ANGIOGRAPHY Left 02/09/2019  ? Procedure: LOWER EXTREMITY ANGIOGRAPHY;  Surgeon: Katha Cabal, MD;  Location: [place] [facility];  Service: Cardiovascular;  Laterality: Left;  ?  LOWER EXTREMITY ANGIOGRAPHY Left 03/10/2019  ? Procedure: LOWER EXTREMITY ANGIOGRAPHY;  Surgeon: Katha Cabal, MD;  Location: [place] [facility];  Service: Cardiovascular;  Laterality: Left;  ? LOWER EXTREMITY ANGIOGRAPHY Left 04/27/2019  ? Procedure: LOWER EXTREMITY ANGIOGRAPHY;  Surgeon: Katha Cabal, MD;  Location: [place] [facility];  Service: Cardiovascular;  Laterality: Left;  ? LOWER EXTREMITY ANGIOGRAPHY Left 06/08/2019  ? Procedure: Lower Extremity Angiography;  Surgeon: Katha Cabal, MD;  Location: [place] [facility];  Service: Cardiovascular;  Laterality: Left;  ? LOWER EXTREMITY ANGIOGRAPHY Left 04/03/2020  ? Procedure: Lower Extremity Angiography;  Surgeon: Algernon Huxley, MD;  Location: [place] [facility];  Service: Cardiovascular;  Laterality: Left;  ? LOWER EXTREMITY ANGIOGRAPHY Right 10/20/2020  ? Procedure: Lower Extremity Angiography;  Surgeon: Katha Cabal, MD;  Location: [place] [facility];  Service: Cardiovascular;  Laterality: Right;  ? LOWER EXTREMITY ANGIOGRAPHY Left 01/23/2021  ? Procedure: LOWER EXTREMITY ANGIOGRAPHY;  Surgeon: Katha Cabal, MD;  Location: [place] [facility];  Service: Cardiovascular;  Laterality: Left;  ? LOWER EXTREMITY ANGIOGRAPHY Right 04/10/2021  ? Procedure: LOWER EXTREMITY ANGIOGRAPHY;  Surgeon: Katha Cabal, MD;  Location: [place] [facility];  Service: Cardiovascular;  Laterality: Right;  ? ORIF TOE FRACTURE Right 02/08/2014  ? Procedure: OPEN REDUCTION INTERNAL FIXATION Right METATARSAL  FRACTURE ;  Surgeon: Wylene Simmer, MD;  Location: [place];  Service: Orthopedics;  Laterality: Right;  ? PERIPHERAL VASCULAR CATHETERIZATION N/A 12/28/2015  ? Procedure: Abdominal Aortogram w/Lower Extremity;  Surgeon: Conrad [place], MD;  Location: [place] [facility];  Service: Cardiovascular;  Laterality: N/A;  ? RADIOLOGY WITH ANESTHESIA N/A 03/01/2016  ? Procedure: RADIOLOGY WITH ANESTHESIA;  Surgeon: [name], MD;  Location: [place];  Service: Radiology;  Laterality: N/A;  ? VEIN HARVEST Right 02/28/2016  ? Procedure: RIGHT GREATER SAPHENOUS VEIN HARVEST;  Surgeon: Conrad [place], MD;  Location: [place];  Service: Vascular;  Laterality: Right;  ? ? ?There were no vitals filed for this visit. ? ? Subjective Assessment - 12/17/21 1150   ? ? Subjective Patient reports feeling, "I don't know." She reports some left leg soreness.   ? Patient is accompained by: Family member   ? Pertinent History 80yoF presenting to [place] to address recent gradual progressive decline in mobility, family goals for improved strength, to promote > participation in transfers to wheelchair and safety with ADL performance. Recent illness includes admission  to [facility] on [facility]/07/21-03/10/21 due to COVID infection c AMS and fever. Pt then underwent outpatient recanalization/limb salvage of RLE 04/10/2021; LLE salvage performed in June 2022. Other PMH: CHF, DM, HTN, CVA, COPD, multiple remote toe amputations, stg 2 sacral ulcer. PLOF includes 3-4 years bed bound status, hoyerlifts to WC, totalA for ADL performance. Pt is cared for by family, as well as professional caregiver services.   ? Limitations Sitting;Walking;Lifting;Standing;House hold activities   ? How long can you sit comfortably? Pt spouse reports 2 hours. Must be careful d/t preventing and trying to heal wounds on bottom.   ? How long can you stand comfortably? unable   ? How long can you walk comfortably? Unable   ? Diagnostic tests 6/21 and 9/06 peripheral vascular catheterizatoin; 04/10/2021 Successful recanalization right lower extremity for limb salvage   ? Patient Stated Goals to strengthen pt's trunk to improve mobility in chair, assist with ADLs   ? Currently in Pain? Yes   ? Pain Score --   unable  to rate, states" right smart"  ? Pain Location Leg   ? Pain Orientation Left   ? Pain Descriptors / Indicators Aching;Sore   ? Pain Type Chronic pain   ? Pain Onset More than a month ago   ? Pain Frequency Intermittent   ? Aggravating Factors  worse with movement/certain positions   ? Pain Relieving Factors rest/heat/ice   ? Effect of Pain on Daily Activities decreased activity tolerance;   ? Multiple Pain Sites No   ? ?  ?  ? ?  ? ? ? ? ? ? ?  ?TREATMENT: ?Seated in front of parallel bars: ?  ?  ?Manual Therapy: ?PT identified increased tightness in BLE particularly in bilateral quads ?Utilized rolling stick to reduce tightness with soft tissue massage x5-10 min with good tolerance;  ?  ?  ?Ex: ?Seated: ?Hamstring curl green tband x10 reps each LE, decreased ROM with verbal and tactile cues to increase ROM for better strengthening ?BLE LAQ with ball between feet x10 reps ?Lifting LE over 1/2 bolster for hip flexion  and hip abduction/adduction x10 reps each LE, required max cues and tactile cues to avoid LAQ and isolate hip flexion;  ? ?Provided visual cues to increase ROM for better strengthening;  ?   ?Sitting in chair: ?F

## 2021-12-19 ENCOUNTER — Ambulatory Visit: Payer: Medicare HMO

## 2021-12-19 DIAGNOSIS — R269 Unspecified abnormalities of gait and mobility: Secondary | ICD-10-CM

## 2021-12-19 DIAGNOSIS — M6281 Muscle weakness (generalized): Secondary | ICD-10-CM | POA: Diagnosis not present

## 2021-12-19 DIAGNOSIS — R293 Abnormal posture: Secondary | ICD-10-CM

## 2021-12-19 DIAGNOSIS — R2681 Unsteadiness on feet: Secondary | ICD-10-CM

## 2021-12-19 DIAGNOSIS — R2689 Other abnormalities of gait and mobility: Secondary | ICD-10-CM

## 2021-12-19 DIAGNOSIS — R278 Other lack of coordination: Secondary | ICD-10-CM

## 2021-12-19 NOTE — Therapy (Signed)
?[facility] ?[place], [place], [address] ?Phone: [phone]   Fax:  [phone] ? ?Physical Therapy Treatment ? ?Patient Details  ?Name: [name] ?MRN: [id number] ?Date of Birth: Dec 05, 1940 ?Referring Provider (PT): Adamo, Elana M ? ? ?Encounter Date: 12/19/2021 ? ? PT End of Session - 12/19/21 1152   ? ? Visit Number 53   ? Number of Visits 65   ? Date for PT Re-Evaluation 01/10/22   ? Authorization Type [employer] Medicare; Medicaid   ? Authorization Time Period 11/05/21-02/01/22   ? Progress Note Due on Visit 50   ? PT Start Time 1015   ? PT Stop Time 1055   ? PT Time Calculation (min) 40 min   ? Equipment Utilized During Treatment Gait belt   ? Activity Tolerance Patient limited by fatigue;Patient tolerated treatment well   ? Behavior During Therapy [facility] for tasks assessed/performed   ? ?  ?  ? ?  ? ? ?Past Medical History:  ?Diagnosis Date  ? Acute heart failure ([place])   ? Aortic stenosis   ? CHF (congestive heart failure) ([place])   ? Complication of anesthesia   ? Hard to wake patient up after having anesthesia  ? Diabetes mellitus without complication ([place])   ? Diabetic neuropathy ([place])   ? Takes Lyrica  ? Edema of both legs   ? Takes Lasix  ? GERD (gastroesophageal reflux disease)   ? High cholesterol   ? HTN (hypertension)   ? Hypertension   ? PAD (peripheral artery disease) ([place])   ? Shortness of breath dyspnea   ? Spasm of back muscles   ? Stroke [facility])   ? Wound, open   ? Right great toe  ? ? ?Past Surgical History:  ?Procedure Laterality Date  ? AMPUTATION TOE Left 06/09/2019  ? Procedure: Left third toe and partial great toe amputation and debridement;  Surgeon: Katha Cabal, MD;  Location: [facility];  Service: Vascular;  Laterality: Left;  ? AMPUTATION TOE Left 04/06/2020  ? Procedure: AMPUTATION LEFT SECOND TOE;  Surgeon: Samara Deist, DPM;  Location: [facility];  Service: Podiatry;  Laterality: Left;  ? BYPASS GRAFT POPLITEAL TO TIBIAL  Right 02/28/2016  ? Procedure: BYPASS GRAFT RIGHT BELOW KNEE POPLITEAL TO PERONEAL USING REVERSED RIGHT GREATER SAPHENOUS VEIN;  Surgeon: Conrad Leesport, MD;  Location: [place];  Service: Vascular;  Laterality: Right;  ? CYST EXCISION    ? Abdomen  ? EYE SURGERY Bilateral   ? Cataract removal  ? INTRAMEDULLARY (IM) NAIL INTERTROCHANTERIC Left 02/04/2014  ? Procedure: INTRAMEDULLARY (IM) NAIL INTERTROCHANTRIC FEMORAL;  Surgeon: Mauri Pole, MD;  Location: [place];  Service: Orthopedics;  Laterality: Left;  ? IR GENERIC HISTORICAL  03/01/2016  ? IR ANGIO INTRA EXTRACRAN SEL COM CAROTID INNOMINATE UNI R MOD SED 03/01/2016 [name], MD [facility]  ? IR GENERIC HISTORICAL  03/01/2016  ? IR ENDOVASC INTRACRANIAL INF OTHER THAN THROMBO ART INC DIAG ANGIO 03/01/2016 [name], MD [facility]  ? IR GENERIC HISTORICAL  03/01/2016  ? IR INTRAVSC STENT CERV CAROTID W/O EMB-PROT MOD SED INC ANGIO 03/01/2016 [name], MD [facility]  ? IR GENERIC HISTORICAL  06/03/2016  ? IR RADIOLOGIST EVAL & MGMT 06/03/2016 [facility]  ? LOWER EXTREMITY ANGIOGRAPHY Left 02/09/2019  ? Procedure: LOWER EXTREMITY ANGIOGRAPHY;  Surgeon: Katha Cabal, MD;  Location: [place] [facility];  Service: Cardiovascular;  Laterality: Left;  ? LOWER EXTREMITY  ANGIOGRAPHY Left 03/10/2019  ? Procedure: LOWER EXTREMITY ANGIOGRAPHY;  Surgeon: Katha Cabal, MD;  Location: [place] [facility];  Service: Cardiovascular;  Laterality: Left;  ? LOWER EXTREMITY ANGIOGRAPHY Left 04/27/2019  ? Procedure: LOWER EXTREMITY ANGIOGRAPHY;  Surgeon: Katha Cabal, MD;  Location: [place] [facility];  Service: Cardiovascular;  Laterality: Left;  ? LOWER EXTREMITY ANGIOGRAPHY Left 06/08/2019  ? Procedure: Lower Extremity Angiography;  Surgeon: Katha Cabal, MD;  Location: [place] [facility];  Service: Cardiovascular;  Laterality: Left;  ? LOWER EXTREMITY ANGIOGRAPHY Left 04/03/2020  ? Procedure: Lower Extremity Angiography;  Surgeon:  Algernon Huxley, MD;  Location: [place] [facility];  Service: Cardiovascular;  Laterality: Left;  ? LOWER EXTREMITY ANGIOGRAPHY Right 10/20/2020  ? Procedure: Lower Extremity Angiography;  Surgeon: Katha Cabal, MD;  Location: [place] [facility];  Service: Cardiovascular;  Laterality: Right;  ? LOWER EXTREMITY ANGIOGRAPHY Left 01/23/2021  ? Procedure: LOWER EXTREMITY ANGIOGRAPHY;  Surgeon: Katha Cabal, MD;  Location: [place] [facility];  Service: Cardiovascular;  Laterality: Left;  ? LOWER EXTREMITY ANGIOGRAPHY Right 04/10/2021  ? Procedure: LOWER EXTREMITY ANGIOGRAPHY;  Surgeon: Katha Cabal, MD;  Location: [place] [facility];  Service: Cardiovascular;  Laterality: Right;  ? ORIF TOE FRACTURE Right 02/08/2014  ? Procedure: OPEN REDUCTION INTERNAL FIXATION Right METATARSAL  FRACTURE ;  Surgeon: Wylene Simmer, MD;  Location: [place];  Service: Orthopedics;  Laterality: Right;  ? PERIPHERAL VASCULAR CATHETERIZATION N/A 12/28/2015  ? Procedure: Abdominal Aortogram w/Lower Extremity;  Surgeon: Conrad Englewood, MD;  Location: [place] [facility];  Service: Cardiovascular;  Laterality: N/A;  ? RADIOLOGY WITH ANESTHESIA N/A 03/01/2016  ? Procedure: RADIOLOGY WITH ANESTHESIA;  Surgeon: [name], MD;  Location: [place];  Service: Radiology;  Laterality: N/A;  ? VEIN HARVEST Right 02/28/2016  ? Procedure: RIGHT GREATER SAPHENOUS VEIN HARVEST;  Surgeon: Conrad [place], MD;  Location: [place];  Service: Vascular;  Laterality: Right;  ? ? ?There were no vitals filed for this visit. ? ? Subjective Assessment - 12/19/21 1146   ? ? Subjective Pt is clam and quiet on arrival. Son denies any significant updates since prior session.   ? Pertinent History 81yoF presenting to [place] to address recent gradual progressive decline in mobility, family goals for improved strength, to promote > participation in transfers to wheelchair and safety with ADL performance. Recent illness includes admission to [facility] on 03/07/21-03/10/21 due  to COVID infection c AMS and fever. Pt then underwent outpatient recanalization/limb salvage of RLE 04/10/2021; LLE salvage performed in June 2022. Other PMH: CHF, DM, HTN, CVA, COPD, multiple remote toe amputations, stg 2 sacral ulcer. PLOF includes 3-4 years bed bound status, hoyerlifts to WC, totalA for ADL performance. Pt is cared for by family, as well as professional caregiver services.   ? Currently in Pain? Yes   ? Pain Location --   Rt temporal HA  ? ?  ?  ? ?  ? ? ?INTERVENTION: ?-finger assisted soft tissue mobilization to bilat distal quads ?-heat application to bilat quads throughout session ?-AA/ROM knee extension, knee flexion, marching, hip/knee extension seated, yellow TB hip ABDCT ?-BUE basketball anterior target taps with trunk flexion ?-removal of arm rests, leg rests and transition to sort sitting upright ?-basket ball shooting high up, then at chest level hoop ?-seated dart board tossing  ?-ball tossing to right/left  ?  ? ? ? ? PT Short Term Goals - 11/15/21 0835   ? ?  ?  PT SHORT TERM GOAL #1  ? Title Patient will be independent in home exercise program to improve strength/mobility for better functional independence with ADLs.   ? Baseline 10/6: to be initiated, 11/8: caregiver reports she is doing them 3x a day; 1/18: doesn't do much exercise; 10/01/2021= Husband reports she is doing better with her HEP and does better exercising her her son. He states no questions at this time. 4/12: husband and family have been helping with HEP, doing some daily;   ? Time 4   ? Period Weeks   ? Status Partially Met   ? Target Date 11/12/21   ? ?  ?  ? ?  ? ? ? ? PT Long Term Goals - 11/15/21 0835   ? ?  ? PT LONG TERM GOAL #1  ? Title Patient will increase FOTO score to equal to or greater than 44 to demonstrate statistically significant improvement in mobility and quality of life.   ? Baseline 10/6: 26, 11/8: 12%, 1/18: 12%, 4/12: deferred;   ? Time 8   ? Period Weeks   ? Status Not Met   ? Target Date  01/10/22   ?  ? PT LONG TERM GOAL #2  ? Title Patient will roll L and R in bed with mod A for decreased caregiver burden for ADLs and toileting.   ? Baseline 10/6: per pt spouse, pt currently dependent, 11/8

## 2021-12-24 ENCOUNTER — Ambulatory Visit: Payer: Medicare HMO | Admitting: Physical Therapy

## 2021-12-24 ENCOUNTER — Encounter: Payer: Self-pay | Admitting: Physical Therapy

## 2021-12-24 DIAGNOSIS — R269 Unspecified abnormalities of gait and mobility: Secondary | ICD-10-CM

## 2021-12-24 DIAGNOSIS — R2689 Other abnormalities of gait and mobility: Secondary | ICD-10-CM

## 2021-12-24 DIAGNOSIS — R262 Difficulty in walking, not elsewhere classified: Secondary | ICD-10-CM

## 2021-12-24 DIAGNOSIS — R278 Other lack of coordination: Secondary | ICD-10-CM

## 2021-12-24 DIAGNOSIS — M6281 Muscle weakness (generalized): Secondary | ICD-10-CM

## 2021-12-24 DIAGNOSIS — R293 Abnormal posture: Secondary | ICD-10-CM

## 2021-12-24 DIAGNOSIS — R2681 Unsteadiness on feet: Secondary | ICD-10-CM

## 2021-12-24 NOTE — Therapy (Signed)
[facility] [place], [place], [address] Phone: [phone]   Fax:  [phone]  Physical Therapy Treatment  Patient Details  Name: [name] MRN: [id number] Date of Birth: Jul 25, 1941 Referring Provider (PT): Sharin Grave   Encounter Date: 12/24/2021    Past Medical History:  Diagnosis Date   Acute heart failure [facility])    Aortic stenosis    CHF (congestive heart failure) ([place])    Complication of anesthesia    Hard to wake patient up after having anesthesia   Diabetes mellitus without complication ([place])    Diabetic neuropathy ([place])    Takes Lyrica   Edema of both legs    Takes Lasix   GERD (gastroesophageal reflux disease)    High cholesterol    HTN (hypertension)    Hypertension    PAD (peripheral artery disease) ([place])    Shortness of breath dyspnea    Spasm of back muscles    Stroke ([place])    Wound, open    Right great toe    Past Surgical History:  Procedure Laterality Date   AMPUTATION TOE Left 06/09/2019   Procedure: Left third toe and partial great toe amputation and debridement;  Surgeon: Katha Cabal, MD;  Location: [facility];  Service: Vascular;  Laterality: Left;   AMPUTATION TOE Left 04/06/2020   Procedure: AMPUTATION LEFT SECOND TOE;  Surgeon: Samara Deist, DPM;  Location: [facility];  Service: Podiatry;  Laterality: Left;   BYPASS GRAFT POPLITEAL TO TIBIAL Right 02/28/2016   Procedure: BYPASS GRAFT RIGHT BELOW KNEE POPLITEAL TO PERONEAL USING REVERSED RIGHT GREATER SAPHENOUS VEIN;  Surgeon: Conrad Duncan, MD;  Location: [place];  Service: Vascular;  Laterality: Right;   CYST EXCISION     Abdomen   EYE SURGERY Bilateral    Cataract removal   INTRAMEDULLARY (IM) NAIL INTERTROCHANTERIC Left 02/04/2014   Procedure: INTRAMEDULLARY (IM) NAIL INTERTROCHANTRIC FEMORAL;  Surgeon: Mauri Pole, MD;  Location: [place];  Service: Orthopedics;  Laterality: Left;   IR GENERIC HISTORICAL  03/01/2016    IR ANGIO INTRA EXTRACRAN SEL COM CAROTID INNOMINATE UNI R MOD SED 03/01/2016 [name], MD [facility]   IR GENERIC HISTORICAL  03/01/2016   IR ENDOVASC INTRACRANIAL INF OTHER THAN THROMBO ART INC DIAG ANGIO 03/01/2016 [name], MD [facility]   IR GENERIC HISTORICAL  03/01/2016   IR INTRAVSC STENT CERV CAROTID W/O EMB-PROT MOD SED INC ANGIO 03/01/2016 [name], MD [facility]   IR GENERIC HISTORICAL  06/03/2016   IR RADIOLOGIST EVAL & MGMT 06/03/2016 [facility]   LOWER EXTREMITY ANGIOGRAPHY Left 02/09/2019   Procedure: LOWER EXTREMITY ANGIOGRAPHY;  Surgeon: Katha Cabal, MD;  Location: [place] [facility];  Service: Cardiovascular;  Laterality: Left;   LOWER EXTREMITY ANGIOGRAPHY Left 03/10/2019   Procedure: LOWER EXTREMITY ANGIOGRAPHY;  Surgeon: Katha Cabal, MD;  Location: [place] [facility];  Service: Cardiovascular;  Laterality: Left;   LOWER EXTREMITY ANGIOGRAPHY Left 04/27/2019   Procedure: LOWER EXTREMITY ANGIOGRAPHY;  Surgeon: Katha Cabal, MD;  Location: [place] [facility];  Service: Cardiovascular;  Laterality: Left;   LOWER EXTREMITY ANGIOGRAPHY Left 06/08/2019   Procedure: Lower Extremity Angiography;  Surgeon: Katha Cabal, MD;  Location: [place] [facility];  Service: Cardiovascular;  Laterality: Left;   LOWER EXTREMITY ANGIOGRAPHY Left 04/03/2020   Procedure: Lower Extremity Angiography;  Surgeon: Algernon Huxley, MD;  Location: [place] [facility];  Service: Cardiovascular;  Laterality: Left;   LOWER EXTREMITY ANGIOGRAPHY Right 10/20/2020   Procedure: Lower Extremity Angiography;  Surgeon: Katha Cabal, MD;  Location: [place] [facility];  Service: Cardiovascular;  Laterality: Right;   LOWER EXTREMITY ANGIOGRAPHY Left 01/23/2021   Procedure: LOWER EXTREMITY ANGIOGRAPHY;  Surgeon: Katha Cabal, MD;  Location: [place] [facility];  Service: Cardiovascular;  Laterality: Left;   LOWER EXTREMITY ANGIOGRAPHY Right  04/10/2021   Procedure: LOWER EXTREMITY ANGIOGRAPHY;  Surgeon: Katha Cabal, MD;  Location: [place] [facility];  Service: Cardiovascular;  Laterality: Right;   ORIF TOE FRACTURE Right 02/08/2014   Procedure: OPEN REDUCTION INTERNAL FIXATION Right METATARSAL  FRACTURE ;  Surgeon: Wylene Simmer, MD;  Location: [place];  Service: Orthopedics;  Laterality: Right;   PERIPHERAL VASCULAR CATHETERIZATION N/A 12/28/2015   Procedure: Abdominal Aortogram w/Lower Extremity;  Surgeon: Conrad [place], MD;  Location: [place] [facility];  Service: Cardiovascular;  Laterality: N/A;   RADIOLOGY WITH ANESTHESIA N/A 03/01/2016   Procedure: RADIOLOGY WITH ANESTHESIA;  Surgeon: [name], MD;  Location: [place];  Service: Radiology;  Laterality: N/A;   VEIN HARVEST Right 02/28/2016   Procedure: RIGHT GREATER SAPHENOUS VEIN HARVEST;  Surgeon: Conrad Knik-Fairview, MD;  Location: [place];  Service: Vascular;  Laterality: Right;    There were no vitals filed for this visit.               TREATMENT: Seated in front of parallel bars:     Manual Therapy: PT identified increased tightness in BLE particularly in bilateral quads PT performed soft tissue massage to bilateral quad to help alleviate stiffness, x5-10 min with good tolerance;      Ex: Seated: Hip flexion march AAROM x10 reps;  Hamstring curl green tband x10 reps each LE, decreased ROM with verbal and tactile cues to increase ROM for better strengthening BLE LAQ with ball between feet x10 reps   Provided visual cues to increase ROM for better strengthening;     Sitting in chair: Forward weight shift in chair Transfer    Instructed patient/caregiver to work on ROM with HEP for better tolerance;    Dependent to scoot back in chair;                         PT Short Term Goals - 11/15/21 0835       PT SHORT TERM GOAL #1   Title Patient will be independent in home exercise program to improve strength/mobility for better  functional independence with ADLs.    Baseline 10/6: to be initiated, 11/8: caregiver reports she is doing them 3x a day; 1/18: doesn't do much exercise; 10/01/2021= Husband reports she is doing better with her HEP and does better exercising her her son. He states no questions at this time. 4/12: husband and family have been helping with HEP, doing some daily;    Time 4    Period Weeks    Status Partially Met    Target Date 11/12/21               PT Long Term Goals - 11/15/21 0835       PT LONG TERM GOAL #1   Title Patient will increase FOTO score to equal to or greater than 44 to demonstrate statistically significant improvement in mobility and quality of life.    Baseline 10/6: 26, 11/8: 12%, 1/18: 12%, 4/12: deferred;    Time 8    Period Weeks  Status Not Met    Target Date 01/10/22      PT LONG TERM GOAL #2   Title Patient will roll L and R in bed with mod A for decreased caregiver burden for ADLs and toileting.    Baseline 10/6: per pt spouse, pt currently dependent, 11/8: mod A, inconsistent, 1/18: needs min A; 10/01/2021=Min//mod assist to right and back to left- Max VC for correct technique. Patient required physical assist with rolling especially moving her legs- she was able to reach and roll her upper torso well with min assist. 4/12: inconsistent max to mod A, able to initiate with UE, requires assistance for LE positioning    Time 8    Period Weeks    Status Partially Met    Target Date 01/10/22      PT LONG TERM GOAL #3   Title Patient will sit upright in WC, with BUE support and without leaning on backrest for 10 minutes for ADL performance and pressure relief.    Baseline 10/6: pt exhibits core contraction but unable to sit upright, 11/8: able to sit upright for <20 sec with mod VCs for erect positioning, 1/18: able to hold for 60 sec with mod VCs. 10/01/2021=Patient was then assessed sitting at [facility]- without back support- unable to maintain > 30 sec without  assistance. 4/12: able to sit forward in wheelchair with arms in lap for 10-15 sec, exhibits increased left lateral lean this session after missing a week of PT    Time 8    Period Weeks    Status Partially Met    Target Date 01/10/22      PT LONG TERM GOAL #4   Title Pt will demonstrate ability to utilize BUEs to weight-shift to each side in WC and maintain weight shift for 30 seconds to assist in pressure relief and reduce risk of wound formation.    Baseline 10/6: pt currently unable, 11/8: unable to initiate weight shift, able to use UE but unable to shift hips without assistance; 1/18: requires max cues and min A for weight shift 4/12: able to use UE on arm rest and initiate weight shift but requires significant increased time, requires min A    Time 8    Period Weeks    Status Partially Met    Target Date 01/10/22      PT LONG TERM GOAL #5   Title Patient will be able to transfer sit<>Stand with LRAD, min A +1 to assist with repositioning and pressure relief as well as ADLs/dressing;    Baseline 4/12: requires mod A +2    Time 8    Period Weeks    Status New    Target Date 01/10/22                    Patient will benefit from skilled therapeutic intervention in order to improve the following deficits and impairments:     Visit Diagnosis: No diagnosis found.     Problem List Patient Active Problem List   Diagnosis Date Noted   Atherosclerosis of native arteries of extremity with rest pain ([place]) 04/20/2021   COVID-19 virus infection 03/07/2021   Acute febrile illness 03/07/2021   Hypoalbuminemia 03/07/2021   Hyperglycemia due to diabetes mellitus ([place]) 03/07/2021   Elevated troponin 03/07/2021   Constipation 03/07/2021   COPD (chronic obstructive pulmonary disease) ([place]) 10/20/2020   GERD (gastroesophageal reflux disease) 10/20/2020   Physical deconditioning 10/20/2020   Cellulitis 10/19/2020   Necrotic toes (  [place])    Insulin dependent diabetes mellitus  type IA ([place])    Diabetic neuropathy ([place])    Gangrene of toe of both feet ([place]) 06/06/2019   Peripheral arterial disease ([place]) 04/27/2019   ESBL (extended spectrum beta-lactamase) producing bacteria infection 12/25/2018   PVD (peripheral vascular disease) ([place]) 09/12/2016   Ulcer of right lower extremity ([place]) 08/29/2016   Acute on chronic diastolic heart failure ([place]) 07/08/2016   HTN (hypertension) 07/05/2016   Sensorineural hearing loss (SNHL), 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   Chronic diastolic CHF (congestive heart failure) ([place]) 03/10/2016   HLD (hyperlipidemia) 03/09/2016   Acute respiratory acidosis ([place]) 03/05/2016   Anemia, iron deficiency 03/03/2016   Cerebrovascular accident (CVA) due to thrombosis of precerebral artery ([place]) 03/01/2016   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 ([place]) 03/01/2016   Atherosclerosis of native arteries of the extremities with gangrene ([place]) 12/08/2015   Anemia, chronic disease 04/27/2014   Unspecified hereditary and idiopathic peripheral neuropathy 03/14/2014   DM2 (diabetes mellitus, type 2) ([place]) 02/14/2014   Aortic stenosis 02/04/2014   Closed left subtrochanteric femur fracture ([place]) 02/03/2014   Hip fracture, left ([place]) 02/03/2014   Fibula fracture 02/03/2014   MTP instability 02/03/2014   Hip fracture ([place]) 02/03/2014    Jaydeen Darley, PT 12/24/2021, 11:05 AM  [place], [place], [address] Phone: [phone]   Fax:  [phone]  Name: VAISHNAVI DALBY MRN: [id number] Date of Birth: [birth year]

## 2021-12-26 ENCOUNTER — Ambulatory Visit: Payer: Medicare HMO

## 2021-12-26 DIAGNOSIS — M6281 Muscle weakness (generalized): Secondary | ICD-10-CM | POA: Diagnosis not present

## 2021-12-26 DIAGNOSIS — R2689 Other abnormalities of gait and mobility: Secondary | ICD-10-CM

## 2021-12-26 DIAGNOSIS — R262 Difficulty in walking, not elsewhere classified: Secondary | ICD-10-CM

## 2021-12-26 DIAGNOSIS — R278 Other lack of coordination: Secondary | ICD-10-CM

## 2021-12-26 DIAGNOSIS — R293 Abnormal posture: Secondary | ICD-10-CM

## 2021-12-26 DIAGNOSIS — R269 Unspecified abnormalities of gait and mobility: Secondary | ICD-10-CM

## 2021-12-26 DIAGNOSIS — R2681 Unsteadiness on feet: Secondary | ICD-10-CM

## 2021-12-26 NOTE — Therapy (Signed)
[place] MAIN [facility] SERVICES [address] [place], [place], [address] Phone: [phone]   Fax:  [phone]  Physical Therapy Treatment  Patient Details  Name: [name] MRN: [id number] Date of Birth: 1941-04-18 Referring Provider (PT): Sharin Grave   Encounter Date: 12/26/2021   PT End of Session - 12/26/21 1220     Visit Number 20    Number of Visits 41    Date for PT Re-Evaluation 01/10/22    Authorization Type [employer]; Medicaid    Authorization Time Period 11/05/21-02/01/22    Progress Note Due on Visit 17    PT Start Time 1023    PT Stop Time 1055    PT Time Calculation (min) 32 min    Activity Tolerance Patient limited by fatigue;Patient tolerated treatment well    Behavior During Therapy [facility] for tasks assessed/performed             Past Medical History:  Diagnosis Date   Acute heart failure ([place])    Aortic stenosis    CHF (congestive heart failure) (HCC)    Complication of anesthesia    Hard to wake patient up after having anesthesia   Diabetes mellitus without complication ([place])    Diabetic neuropathy ([place])    Takes Lyrica   Edema of both legs    Takes Lasix   GERD (gastroesophageal reflux disease)    High cholesterol    HTN (hypertension)    Hypertension    PAD (peripheral artery disease) ([place])    Shortness of breath dyspnea    Spasm of back muscles    Stroke ([place])    Wound, open    Right great toe    Past Surgical History:  Procedure Laterality Date   AMPUTATION TOE Left 06/09/2019   Procedure: Left third toe and partial great toe amputation and debridement;  Surgeon: Katha Cabal, MD;  Location: [facility];  Service: Vascular;  Laterality: Left;   AMPUTATION TOE Left 04/06/2020   Procedure: AMPUTATION LEFT SECOND TOE;  Surgeon: Samara Deist, DPM;  Location: [facility];  Service: Podiatry;  Laterality: Left;   BYPASS GRAFT POPLITEAL TO TIBIAL Right 02/28/2016   Procedure: BYPASS GRAFT RIGHT  BELOW KNEE POPLITEAL TO PERONEAL USING REVERSED RIGHT GREATER SAPHENOUS VEIN;  Surgeon: Conrad Endwell, MD;  Location: [place];  Service: Vascular;  Laterality: Right;   CYST EXCISION     Abdomen   EYE SURGERY Bilateral    Cataract removal   INTRAMEDULLARY (IM) NAIL INTERTROCHANTERIC Left 02/04/2014   Procedure: INTRAMEDULLARY (IM) NAIL INTERTROCHANTRIC FEMORAL;  Surgeon: Mauri Pole, MD;  Location: [place];  Service: Orthopedics;  Laterality: Left;   IR GENERIC HISTORICAL  03/01/2016   IR ANGIO INTRA EXTRACRAN SEL COM CAROTID INNOMINATE UNI R MOD SED 03/01/2016 [name], MD [facility]   IR GENERIC HISTORICAL  03/01/2016   IR ENDOVASC INTRACRANIAL INF OTHER THAN THROMBO ART INC DIAG ANGIO 03/01/2016 [name], MD [facility]   IR GENERIC HISTORICAL  03/01/2016   IR INTRAVSC STENT CERV CAROTID W/O EMB-PROT MOD SED INC ANGIO 03/01/2016 [name], MD [facility]   IR GENERIC HISTORICAL  06/03/2016   IR RADIOLOGIST EVAL & MGMT 06/03/2016 [facility]   LOWER EXTREMITY ANGIOGRAPHY Left 02/09/2019   Procedure: LOWER EXTREMITY ANGIOGRAPHY;  Surgeon: Katha Cabal, MD;  Location: [place] [facility];  Service: Cardiovascular;  Laterality: Left;   LOWER EXTREMITY ANGIOGRAPHY Left 03/10/2019   Procedure: LOWER EXTREMITY ANGIOGRAPHY;  Surgeon: Katha Cabal, MD;  Location: [place] [facility];  Service: Cardiovascular;  Laterality: Left;   LOWER EXTREMITY ANGIOGRAPHY Left 04/27/2019   Procedure: LOWER EXTREMITY ANGIOGRAPHY;  Surgeon: Katha Cabal, MD;  Location: [place] [facility];  Service: Cardiovascular;  Laterality: Left;   LOWER EXTREMITY ANGIOGRAPHY Left 06/08/2019   Procedure: Lower Extremity Angiography;  Surgeon: Katha Cabal, MD;  Location: [place] [facility];  Service: Cardiovascular;  Laterality: Left;   LOWER EXTREMITY ANGIOGRAPHY Left 04/03/2020   Procedure: Lower Extremity Angiography;  Surgeon: Algernon Huxley, MD;  Location: [place] [facility];   Service: Cardiovascular;  Laterality: Left;   LOWER EXTREMITY ANGIOGRAPHY Right 10/20/2020   Procedure: Lower Extremity Angiography;  Surgeon: Katha Cabal, MD;  Location: [place] [facility];  Service: Cardiovascular;  Laterality: Right;   LOWER EXTREMITY ANGIOGRAPHY Left 01/23/2021   Procedure: LOWER EXTREMITY ANGIOGRAPHY;  Surgeon: Katha Cabal, MD;  Location: [place] [facility];  Service: Cardiovascular;  Laterality: Left;   LOWER EXTREMITY ANGIOGRAPHY Right 04/10/2021   Procedure: LOWER EXTREMITY ANGIOGRAPHY;  Surgeon: Katha Cabal, MD;  Location: [place] [facility];  Service: Cardiovascular;  Laterality: Right;   ORIF TOE FRACTURE Right 02/08/2014   Procedure: OPEN REDUCTION INTERNAL FIXATION Right METATARSAL  FRACTURE ;  Surgeon: Wylene Simmer, MD;  Location: [place];  Service: Orthopedics;  Laterality: Right;   PERIPHERAL VASCULAR CATHETERIZATION N/A 12/28/2015   Procedure: Abdominal Aortogram w/Lower Extremity;  Surgeon: Conrad [place], MD;  Location: [place] [facility];  Service: Cardiovascular;  Laterality: N/A;   RADIOLOGY WITH ANESTHESIA N/A 03/01/2016   Procedure: RADIOLOGY WITH ANESTHESIA;  Surgeon: [name], MD;  Location: [place];  Service: Radiology;  Laterality: N/A;   VEIN HARVEST Right 02/28/2016   Procedure: RIGHT GREATER SAPHENOUS VEIN HARVEST;  Surgeon: Conrad [place], MD;  Location: [place];  Service: Vascular;  Laterality: Right;    There were no vitals filed for this visit.   Subjective Assessment - 12/26/21 1218     Subjective Pt arrives late, per husband heavy traffic this morning. No updates per them. Pt appears somewhat mor esleepy than usual, husband questions whether the tylenol she took earlie ris having this affect.    Patient is accompained by: Family member    Pertinent History 68yoF presenting to [facility] to address recent gradual progressive decline in mobility, family goals for improved strength, to promote > participation in transfers to  wheelchair and safety with ADL performance. Recent illness includes admission to [facility] on 03/07/21-03/10/21 due to COVID infection c AMS and fever. Pt then underwent outpatient recanalization/limb salvage of RLE 04/10/2021; LLE salvage performed in June 2022. Other PMH: CHF, DM, HTN, CVA, COPD, multiple remote toe amputations, stg 2 sacral ulcer. PLOF includes 3-4 years bed bound status, hoyerlifts to WC, totalA for ADL performance. Pt is cared for by family, as well as professional caregiver services.    Currently in Pain? No/denies             INTERVENTION: -heat application to quads throughout session  -myofascial release and soft tissue stroking paired with gentle knee flexion ROM -bilat knee flexion/extension ROM bilat  ~10x each side (pt cognitively struggles with cues for active knee flexion)  -bilat marching (AA/ROM) and resisted hip extension (AR/ROM) ~10 each side pt able to demonstrate good activation on cue ~20% of reps) -pulling self into flexion in WC (tall sitting) 1x2 maxA, 1x2 maxA, 1x3 modA, 1x5 maxA  -trunk flexion  to physio ball grab (pt not interested, refused)      PT Education - 12/26/21 1222     Education Details pt is poorly motivated    Person(s) Educated Spouse    Methods Explanation;Demonstration    Comprehension Verbalized understanding;Need further instruction              PT Short Term Goals - 11/15/21 0835       PT SHORT TERM GOAL #1   Title Patient will be independent in home exercise program to improve strength/mobility for better functional independence with ADLs.    Baseline 10/6: to be initiated, 11/8: caregiver reports she is doing them 3x a day; 1/18: doesn't do much exercise; 10/01/2021= Husband reports she is doing better with her HEP and does better exercising her her son. He states no questions at this time. 4/12: husband and family have been helping with HEP, doing some daily;    Time 4    Period Weeks    Status Partially Met    Target Date  11/12/21               PT Long Term Goals - 11/15/21 0835       PT LONG TERM GOAL #1   Title Patient will increase FOTO score to equal to or greater than 44 to demonstrate statistically significant improvement in mobility and quality of life.    Baseline 10/6: 26, 11/8: 12%, 1/18: 12%, 4/12: deferred;    Time 8    Period Weeks    Status Not Met    Target Date 01/10/22      PT LONG TERM GOAL #2   Title Patient will roll L and R in bed with mod A for decreased caregiver burden for ADLs and toileting.    Baseline 10/6: per pt spouse, pt currently dependent, 11/8: mod A, inconsistent, 1/18: needs min A; 10/01/2021=Min//mod assist to right and back to left- Max VC for correct technique. Patient required physical assist with rolling especially moving her legs- she was able to reach and roll her upper torso well with min assist. 4/12: inconsistent max to mod A, able to initiate with UE, requires assistance for LE positioning    Time 8    Period Weeks    Status Partially Met    Target Date 01/10/22      PT LONG TERM GOAL #3   Title Patient will sit upright in WC, with BUE support and without leaning on backrest for 10 minutes for ADL performance and pressure relief.    Baseline 10/6: pt exhibits core contraction but unable to sit upright, 11/8: able to sit upright for <20 sec with mod VCs for erect positioning, 1/18: able to hold for 60 sec with mod VCs. 10/01/2021=Patient was then assessed sitting at [facility]- without back support- unable to maintain > 30 sec without assistance. 4/12: able to sit forward in wheelchair with arms in lap for 10-15 sec, exhibits increased left lateral lean this session after missing a week of PT    Time 8    Period Weeks    Status Partially Met    Target Date 01/10/22      PT LONG TERM GOAL #4   Title Pt will demonstrate ability to utilize BUEs to weight-shift to each side in WC and maintain weight shift for 30 seconds to assist in pressure relief and reduce risk  of wound formation.    Baseline 10/6: pt currently unable, 11/8: unable to initiate weight shift, able  to use UE but unable to shift hips without assistance; 1/18: requires max cues and min A for weight shift 4/12: able to use UE on arm rest and initiate weight shift but requires significant increased time, requires min A    Time 8    Period Weeks    Status Partially Met    Target Date 01/10/22      PT LONG TERM GOAL #5   Title Patient will be able to transfer sit<>Stand with LRAD, min A +1 to assist with repositioning and pressure relief as well as ADLs/dressing;    Baseline 4/12: requires mod A +2    Time 8    Period Weeks    Status New    Target Date 01/10/22                   Plan - 12/26/21 1223     Clinical Impression Statement Session focus on basic ROM, strength, motor control, pain control, and endurance to maximize pt's ability to reposition in W/C, manage her postural adjustments  throuhgout the day. Pt has limited insight and limited motivation, both of which make any painful movements a show stopper. Manual techniques and modalities used to improve tolerance adn reduce pain. Pt struggles to follow cues for most leg exercises, 75% of which become knee extension despite cues. Pt able to grab both WC arm rests and pull trunk into flexion c modA. For a final set of 5, but getting to full upright is not achieved due to postural tone. Authro adjusted leg rests to allow for more knee flexion as pt is often in extension positions when in chair, but husband instinctively readjusts assuming them to be oin the wrong place. Will continue to work with pt as she is able to participate.    Personal Factors and Comorbidities Age;Time since onset of injury/illness/exacerbation;Comorbidity 3+;Past/Current Experience;Education;Social Background;Transportation;Sex;Fitness    Comorbidities diastolic congestive heart failure, diabetes mellitus, diabetic neuropathy, hypertension, hyperlipidemia,  GERD, peripheral vascular disease, CVA. on 9/2 had amputation of L foot second toe MTP joint and LLE revascularization, recent hx of COVID19    Examination-Activity Limitations Bathing;Hygiene/Grooming;Squat;Bed Mobility;Stairs;Lift;Bend;Locomotion Level;Stand;Caring for [employer];Toileting;Carry;Self Feeding;Transfers;Continence;Dressing;Other;Sit    Examination-Participation Restrictions Cleaning;Shop;Community Activity;Meal Prep;Driving;Other    Stability/Clinical Decision Making Evolving/Moderate complexity    Clinical Decision Making High    Rehab Potential Poor    PT Frequency 2x / week    PT Duration 8 weeks    PT Treatment/Interventions ADLs/Self Care Home Management;DME Instruction;Functional mobility training;Therapeutic activities;Therapeutic exercise;Neuromuscular re-education;Patient/family education;Wheelchair mobility training;Passive range of motion;Energy conservation;Cryotherapy;Biofeedback;Electrical Stimulation;Iontophoresis 6m/ml Dexamethasone;Moist Heat;Traction;Ultrasound;Stair training;Cognitive remediation;Balance training;Orthotic Fit/Training;Manual techniques;Manual lymph drainage;Splinting;Taping;Vasopneumatic Device;Vestibular;Visual/perceptual remediation/compensation;Gait training    PT Next Visit Plan hip knee ROM, trunk control in sitting, trunk flexion    PT Home Exercise Plan Previous- Seated lateral weight shift pressure relief technique in wheelchair to be performed 5 days/week for 2 sets of 10 repetitions each side.  PT provided patient with picture of technique as well.  05/21/2021- Access Code: [email or web address];Access Code: [email or web address]   Consulted and Agree with Plan of Care Family member/caregiver    Family Member Consulted Spouse             Patient will benefit from skilled therapeutic intervention in order to improve the following deficits and impairments:  Decreased cognition, Decreased skin integrity, Impaired sensation, Decreased mobility,  Postural dysfunction, Decreased activity tolerance, Decreased endurance, Decreased range of motion, Decreased strength, Decreased balance, Decreased safety awareness, Increased edema, Impaired flexibility, Decreased coordination, Cardiopulmonary status limiting  activity, Abnormal gait, Impaired UE functional use, Impaired tone, Improper body mechanics, Pain, Difficulty walking, Decreased knowledge of use of DME, Hypomobility, Impaired perceived functional ability  Visit Diagnosis: Muscle weakness (generalized)  Other abnormalities of gait and mobility  Abnormality of gait and mobility  Unsteadiness on feet  Other lack of coordination  Abnormal posture  Difficulty in walking, not elsewhere classified     Problem List Patient Active Problem List   Diagnosis Date Noted   Atherosclerosis of native arteries of extremity with rest pain ([place]) 04/20/2021   COVID-19 virus infection 03/07/2021   Acute febrile illness 03/07/2021   Hypoalbuminemia 03/07/2021   Hyperglycemia due to diabetes mellitus ([place]) 03/07/2021   Elevated troponin 03/07/2021   Constipation 03/07/2021   COPD (chronic obstructive pulmonary disease) ([place]) 10/20/2020   GERD (gastroesophageal reflux disease) 10/20/2020   Physical deconditioning 10/20/2020   Cellulitis 10/19/2020   Necrotic toes (HCC)    Insulin dependent diabetes mellitus type IA ([place])    Diabetic neuropathy (HCC)    Gangrene of toe of both feet ([place]) 06/06/2019   Peripheral arterial disease ([place]) 04/27/2019   ESBL (extended spectrum beta-lactamase) producing bacteria infection 12/25/2018   PVD (peripheral vascular disease) ([place]) 09/12/2016   Ulcer of right lower extremity ([place]) 08/29/2016   Acute on chronic diastolic heart failure ([place]) 07/08/2016   HTN (hypertension) 07/05/2016   Sensorineural hearing loss (SNHL), 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   Chronic diastolic CHF (congestive heart failure) ([place]) 03/10/2016   HLD (hyperlipidemia) 03/09/2016   Acute  respiratory acidosis ([place]) 03/05/2016   Anemia, iron deficiency 03/03/2016   Cerebrovascular accident (CVA) due to thrombosis of precerebral artery ([place]) 03/01/2016   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 ([place]) 03/01/2016   Atherosclerosis of native arteries of the extremities with gangrene ([place]) 12/08/2015   Anemia, chronic disease 04/27/2014   Unspecified hereditary and idiopathic peripheral neuropathy 03/14/2014   DM2 (diabetes mellitus, type 2) ([place]) 02/14/2014   Aortic stenosis 02/04/2014   Closed left subtrochanteric femur fracture ([place]) 02/03/2014   Hip fracture, left ([place]) 02/03/2014   Fibula fracture 02/03/2014   MTP instability 02/03/2014   Hip fracture ([place]) 02/03/2014   12:44 PM, 12/26/21 Etta Grandchild, PT, DPT Physical Therapist - [facility]  Outpatient Physical Therapy- [place] [phone]     [place], PT 12/26/2021, 12:35 PM  [place] MAIN [facility] SERVICES [address] [place], [place], [address] Phone: [phone]   Fax:  [phone]  Name: [name] MRN: [id number] Date of Birth: [birth year]

## 2022-01-01 ENCOUNTER — Ambulatory Visit: Payer: Medicare HMO | Admitting: Physical Therapy

## 2022-01-03 ENCOUNTER — Ambulatory Visit: Payer: Medicare HMO | Admitting: Occupational Therapy

## 2022-01-03 ENCOUNTER — Ambulatory Visit: Payer: Medicare HMO | Attending: Family Medicine

## 2022-01-03 DIAGNOSIS — M6281 Muscle weakness (generalized): Secondary | ICD-10-CM | POA: Diagnosis present

## 2022-01-03 DIAGNOSIS — R2681 Unsteadiness on feet: Secondary | ICD-10-CM | POA: Insufficient documentation

## 2022-01-03 DIAGNOSIS — R2689 Other abnormalities of gait and mobility: Secondary | ICD-10-CM | POA: Diagnosis present

## 2022-01-03 NOTE — Therapy (Signed)
[place] MAIN [facility] SERVICES [address] [place], [place], [address] Phone: [phone]   Fax:  [phone]  Physical Therapy Treatment/ Physical Therapy Progress Note   Dates of reporting period  11/26/21   to   01/03/22   Patient Details  Name: [name] MRN: [id number] Date of Birth: [birth year] Referring Provider (PT): Sharin Grave   Encounter Date: 01/03/2022   PT End of Session - 01/03/22 1254     Visit Number 50    Number of Visits 67    Date for PT Re-Evaluation 01/10/22    Authorization Type [employer]; Medicaid    Authorization Time Period 11/05/21-02/01/22    Progress Note Due on Visit 30    PT Start Time 1300    PT Stop Time 1344    PT Time Calculation (min) 44 min    Activity Tolerance Patient limited by fatigue;Patient tolerated treatment well    Behavior During Therapy [facility] for tasks assessed/performed             Past Medical History:  Diagnosis Date   Acute heart failure ([place])    Aortic stenosis    CHF (congestive heart failure) (HCC)    Complication of anesthesia    Hard to wake patient up after having anesthesia   Diabetes mellitus without complication ([place])    Diabetic neuropathy ([place])    Takes Lyrica   Edema of both legs    Takes Lasix   GERD (gastroesophageal reflux disease)    High cholesterol    HTN (hypertension)    Hypertension    PAD (peripheral artery disease) ([place])    Shortness of breath dyspnea    Spasm of back muscles    Stroke ([place])    Wound, open    Right great toe    Past Surgical History:  Procedure Laterality Date   AMPUTATION TOE Left 06/09/2019   Procedure: Left third toe and partial great toe amputation and debridement;  Surgeon: Katha Cabal, MD;  Location: [facility];  Service: Vascular;  Laterality: Left;   AMPUTATION TOE Left 04/06/2020   Procedure: AMPUTATION LEFT SECOND TOE;  Surgeon: Samara Deist, DPM;  Location: [facility];  Service: Podiatry;  Laterality: Left;    BYPASS GRAFT POPLITEAL TO TIBIAL Right 02/28/2016   Procedure: BYPASS GRAFT RIGHT BELOW KNEE POPLITEAL TO PERONEAL USING REVERSED RIGHT GREATER SAPHENOUS VEIN;  Surgeon: Conrad [place] Hill, MD;  Location: [place];  Service: Vascular;  Laterality: Right;   CYST EXCISION     Abdomen   EYE SURGERY Bilateral    Cataract removal   INTRAMEDULLARY (IM) NAIL INTERTROCHANTERIC Left 02/04/2014   Procedure: INTRAMEDULLARY (IM) NAIL INTERTROCHANTRIC FEMORAL;  Surgeon: Mauri Pole, MD;  Location: [place];  Service: Orthopedics;  Laterality: Left;   IR GENERIC HISTORICAL  03/01/2016   IR ANGIO INTRA EXTRACRAN SEL COM CAROTID INNOMINATE UNI R MOD SED 03/01/2016 [name], MD [facility]   IR GENERIC HISTORICAL  03/01/2016   IR ENDOVASC INTRACRANIAL INF OTHER THAN THROMBO ART INC DIAG ANGIO 03/01/2016 [name], MD [facility]   IR GENERIC HISTORICAL  03/01/2016   IR INTRAVSC STENT CERV CAROTID W/O EMB-PROT MOD SED INC ANGIO 03/01/2016 [name], MD [facility]   IR GENERIC HISTORICAL  06/03/2016   IR RADIOLOGIST EVAL & MGMT 06/03/2016 [facility]   LOWER EXTREMITY ANGIOGRAPHY Left 02/09/2019   Procedure: LOWER EXTREMITY ANGIOGRAPHY;  Surgeon: Katha Cabal, MD;  Location: [place] CV LAB;  Service: Cardiovascular;  Laterality: Left;   LOWER EXTREMITY ANGIOGRAPHY Left 03/10/2019   Procedure: LOWER EXTREMITY ANGIOGRAPHY;  Surgeon: Katha Cabal, MD;  Location: [place] CV LAB;  Service: Cardiovascular;  Laterality: Left;   LOWER EXTREMITY ANGIOGRAPHY Left 04/27/2019   Procedure: LOWER EXTREMITY ANGIOGRAPHY;  Surgeon: Katha Cabal, MD;  Location: [place] CV LAB;  Service: Cardiovascular;  Laterality: Left;   LOWER EXTREMITY ANGIOGRAPHY Left 06/08/2019   Procedure: Lower Extremity Angiography;  Surgeon: Katha Cabal, MD;  Location: [place] CV LAB;  Service: Cardiovascular;  Laterality: Left;   LOWER EXTREMITY ANGIOGRAPHY Left 04/03/2020   Procedure: Lower  Extremity Angiography;  Surgeon: Algernon Huxley, MD;  Location: [place] CV LAB;  Service: Cardiovascular;  Laterality: Left;   LOWER EXTREMITY ANGIOGRAPHY Right 10/20/2020   Procedure: Lower Extremity Angiography;  Surgeon: Katha Cabal, MD;  Location: [place] CV LAB;  Service: Cardiovascular;  Laterality: Right;   LOWER EXTREMITY ANGIOGRAPHY Left 01/23/2021   Procedure: LOWER EXTREMITY ANGIOGRAPHY;  Surgeon: Katha Cabal, MD;  Location: [place] CV LAB;  Service: Cardiovascular;  Laterality: Left;   LOWER EXTREMITY ANGIOGRAPHY Right 04/10/2021   Procedure: LOWER EXTREMITY ANGIOGRAPHY;  Surgeon: Katha Cabal, MD;  Location: [place] CV LAB;  Service: Cardiovascular;  Laterality: Right;   ORIF TOE FRACTURE Right 02/08/2014   Procedure: OPEN REDUCTION INTERNAL FIXATION Right METATARSAL  FRACTURE ;  Surgeon: Wylene Simmer, MD;  Location: [place];  Service: Orthopedics;  Laterality: Right;   PERIPHERAL VASCULAR CATHETERIZATION N/A 12/28/2015   Procedure: Abdominal Aortogram w/Lower Extremity;  Surgeon: Conrad York, MD;  Location: [place] CV LAB;  Service: Cardiovascular;  Laterality: N/A;   RADIOLOGY WITH ANESTHESIA N/A 03/01/2016   Procedure: RADIOLOGY WITH ANESTHESIA;  Surgeon: [name], MD;  Location: [place];  Service: Radiology;  Laterality: N/A;   VEIN HARVEST Right 02/28/2016   Procedure: RIGHT GREATER SAPHENOUS VEIN HARVEST;  Surgeon: Conrad Amargosa, MD;  Location: [place];  Service: Vascular;  Laterality: Right;    There were no vitals filed for this visit.   Subjective Assessment - 01/03/22 1303     Subjective Patient reports her leg and foot continues to bother her.    Patient is accompained by: Family member    Pertinent History 30yoF presenting to [place] to address recent gradual progressive decline in mobility, family goals for improved strength, to promote > participation in transfers to wheelchair and safety with ADL performance. Recent illness  includes admission to [facility] on 03/07/21-03/10/21 due to COVID infection c AMS and fever. Pt then underwent outpatient recanalization/limb salvage of RLE 04/10/2021; LLE salvage performed in June 2022. Other PMH: CHF, DM, HTN, CVA, COPD, multiple remote toe amputations, stg 2 sacral ulcer. PLOF includes 3-4 years bed bound status, hoyerlifts to WC, totalA for ADL performance. Pt is cared for by family, as well as professional caregiver services.    How long can you sit comfortably? Pt spouse reports 2 hours. Must be careful d/t preventing and trying to heal wounds on bottom.    How long can you stand comfortably? unable    How long can you walk comfortably? Unable    Diagnostic tests 6/21 and 9/06 peripheral vascular catheterizatoin; 04/10/2021 Successful recanalization right lower extremity for limb salvage    Patient Stated Goals to strengthen pt's trunk to improve mobility in chair, assist with ADLs    Currently in Pain? Yes    Pain Score --   won't give a  number   Pain Location Leg    Pain Orientation Right;Left    Pain Descriptors / Indicators Aching    Pain Type Chronic pain                Goals:  HEP: son and husband help FOTO : 12% Roll R and L with Mod A : per husband needs assistance  Sit upright in WC with BUE support for 10 minutes: patient refuses; only able to hold 1-2 seconds Use BUE to weight shift side to side  in w/c -patient refuse Transfer sit to stand with LRAD min A x +1-patient refuse    Treatment:  Seated:  1lb dumbells: -alternating punches 10x each arm -bicep curl 10x each UE; x2 sets Rainbow ball adduction ball squeeze 10x 2 sets GTB hamstring curl 10x each LE x 2 sets Lateral weight shift-patient refuse unable to perform -forward weight shift patient refused to participate      Patient's condition has the potential to improve in response to therapy. Maximum improvement is yet to be obtained. The anticipated improvement is attainable and reasonable in a  generally predictable time.     Patient session significantly limited by patient participation. Husband educated on need to participate with therapy sessions. Unable to assess full function of goals due to patient limited compliance/participation. She struggles to follow commands today however is unclear if patient is unwilling to participate or mentally unable to, however due to frequency of eye rolling at PT is indicative of unwillingness of patient. Patient's condition has the potential to improve in response to therapy. Maximum improvement is yet to be obtained. The anticipated improvement is attainable and reasonable in a generally predictable time.            PT Education - 01/03/22 1252     Education Details exercise technique, progress note    Person(s) Educated Patient    Methods Explanation;Demonstration;Tactile cues;Verbal cues    Comprehension Verbalized understanding;Returned demonstration;Verbal cues required;Tactile cues required              PT Short Term Goals - 01/03/22 1348       PT SHORT TERM GOAL #1   Title Patient will be independent in home exercise program to improve strength/mobility for better functional independence with ADLs.    Baseline 10/6: to be initiated, 11/8: caregiver reports she is doing them 3x a day; 1/18: doesn't do much exercise; 10/01/2021= Husband reports she is doing better with her HEP and does better exercising her her son. He states no questions at this time. 4/12: husband and family have been helping with HEP, doing some daily; 6/1: husband and son helping    Time 4    Period Weeks    Status Partially Met    Target Date 11/12/21               PT Long Term Goals - 01/03/22 1349       PT LONG TERM GOAL #1   Title Patient will increase FOTO score to equal to or greater than 44 to demonstrate statistically significant improvement in mobility and quality of life.    Baseline 10/6: 26, 11/8: 12%, 1/18: 12%, 4/12: deferred; 6/1:  12%    Time 8    Period Weeks    Status Not Met    Target Date 01/10/22      PT LONG TERM GOAL #2   Title Patient will roll L and R in bed with mod A for decreased caregiver  burden for ADLs and toileting.    Baseline 10/6: per pt spouse, pt currently dependent, 11/8: mod A, inconsistent, 1/18: needs min A; 10/01/2021=Min//mod assist to right and back to left- Max VC for correct technique. Patient required physical assist with rolling especially moving her legs- she was able to reach and roll her upper torso well with min assist. 4/12: inconsistent max to mod A, able to initiate with UE, requires assistance for LE positioning 6/1: per husband continues to require assistance    Time 8    Period Weeks    Status Partially Met    Target Date 01/10/22      PT LONG TERM GOAL #3   Title Patient will sit upright in WC, with BUE support and without leaning on backrest for 10 minutes for ADL performance and pressure relief.    Baseline 10/6: pt exhibits core contraction but unable to sit upright, 11/8: able to sit upright for <20 sec with mod VCs for erect positioning, 1/18: able to hold for 60 sec with mod VCs. 10/01/2021=Patient was then assessed sitting at [facility]- without back support- unable to maintain > 30 sec without assistance. 4/12: able to sit forward in wheelchair with arms in lap for 10-15 sec, exhibits increased left lateral lean this session after missing a week of PT 6/1: patient refused to attempt    Time 8    Period Weeks    Status Unable to assess    Target Date 01/10/22      PT LONG TERM GOAL #4   Title Pt will demonstrate ability to utilize BUEs to weight-shift to each side in WC and maintain weight shift for 30 seconds to assist in pressure relief and reduce risk of wound formation.    Baseline 10/6: pt currently unable, 11/8: unable to initiate weight shift, able to use UE but unable to shift hips without assistance; 1/18: requires max cues and min A for weight shift 4/12: able to use  UE on arm rest and initiate weight shift but requires significant increased time, requires min A 6/1: patient refused to attempt    Time 8    Period Weeks    Status Unable to assess    Target Date 01/10/22      PT LONG TERM GOAL #5   Title Patient will be able to transfer sit<>Stand with LRAD, min A +1 to assist with repositioning and pressure relief as well as ADLs/dressing;    Baseline 4/12: requires mod A +2 6/1: patient refused to attempt this session    Time 8    Period Weeks    Status Unable to assess    Target Date 01/10/22                   Plan - 01/03/22 1348     Clinical Impression Statement Patient session significantly limited by patient participation. Husband educated on need to participate with therapy sessions. Unable to assess full function of goals due to patient limited compliance/participation. She struggles to follow commands today however is unclear if patient is unwilling to participate or mentally unable to, however due to frequency of eye rolling at PT is indicative of unwillingness of patient. Patient's condition has the potential to improve in response to therapy. Maximum improvement is yet to be obtained. The anticipated improvement is attainable and reasonable in a generally predictable time.    Personal Factors and Comorbidities Age;Time since onset of injury/illness/exacerbation;Comorbidity 3+;Past/Current Experience;Education;Social Background;Transportation;Sex;Fitness    Comorbidities diastolic congestive heart  failure, diabetes mellitus, diabetic neuropathy, hypertension, hyperlipidemia, GERD, peripheral vascular disease, CVA. on 9/2 had amputation of L foot second toe MTP joint and LLE revascularization, recent hx of COVID19    Examination-Activity Limitations Bathing;Hygiene/Grooming;Squat;Bed Mobility;Stairs;Lift;Bend;Locomotion Level;Stand;Caring for [employer];Toileting;Carry;Self Feeding;Transfers;Continence;Dressing;Other;Sit     Examination-Participation Restrictions Cleaning;Shop;Community Activity;Meal Prep;Driving;Other    Stability/Clinical Decision Making Evolving/Moderate complexity    Rehab Potential Poor    PT Frequency 2x / week    PT Duration 8 weeks    PT Treatment/Interventions ADLs/Self Care Home Management;DME Instruction;Functional mobility training;Therapeutic activities;Therapeutic exercise;Neuromuscular re-education;Patient/family education;Wheelchair mobility training;Passive range of motion;Energy conservation;Cryotherapy;Biofeedback;Electrical Stimulation;Iontophoresis 44m/ml Dexamethasone;Moist Heat;Traction;Ultrasound;Stair training;Cognitive remediation;Balance training;Orthotic Fit/Training;Manual techniques;Manual lymph drainage;Splinting;Taping;Vasopneumatic Device;Vestibular;Visual/perceptual remediation/compensation;Gait training    PT Next Visit Plan hip knee ROM, trunk control in sitting, trunk flexion    PT Home Exercise Plan Previous- Seated lateral weight shift pressure relief technique in wheelchair to be performed 5 days/week for 2 sets of 10 repetitions each side.  PT provided patient with picture of technique as well.  05/21/2021- Access Code: [email or web address];Access Code: [email or web address]   Consulted and Agree with Plan of Care Family member/caregiver    Family Member Consulted Spouse             Patient will benefit from skilled therapeutic intervention in order to improve the following deficits and impairments:  Decreased cognition, Decreased skin integrity, Impaired sensation, Decreased mobility, Postural dysfunction, Decreased activity tolerance, Decreased endurance, Decreased range of motion, Decreased strength, Decreased balance, Decreased safety awareness, Increased edema, Impaired flexibility, Decreased coordination, Cardiopulmonary status limiting activity, Abnormal gait, Impaired UE functional use, Impaired tone, Improper body mechanics, Pain, Difficulty walking, Decreased knowledge of use  of DME, Hypomobility, Impaired perceived functional ability  Visit Diagnosis: Muscle weakness (generalized)  Other abnormalities of gait and mobility  Unsteadiness on feet     Problem List Patient Active Problem List   Diagnosis Date Noted   Atherosclerosis of native arteries of extremity with rest pain ([place] 04/20/2021   COVID-19 virus infection 03/07/2021   Acute febrile illness 03/07/2021   Hypoalbuminemia 03/07/2021   Hyperglycemia due to diabetes mellitus ([place] 03/07/2021   Elevated troponin 03/07/2021   Constipation 03/07/2021   COPD (chronic obstructive pulmonary disease) ([place] 10/20/2020   GERD (gastroesophageal reflux disease) 10/20/2020   Physical deconditioning 10/20/2020   Cellulitis 10/19/2020   Necrotic toes (HCC)    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)    Diabetic neuropathy (HCC)    Gangrene of toe of both feet ([place] 06/06/2019   Peripheral arterial disease ([place] 04/27/2019   ESBL (extended spectrum beta-lactamase) producing bacteria infection 12/25/2018   PVD (peripheral vascular disease) ([place] 09/12/2016   Ulcer of right lower extremity ([place] 08/29/2016   Acute on chronic diastolic heart failure ([place] 07/08/2016   HTN (hypertension) 07/05/2016   Sensorineural hearing loss (SNHL), 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   Chronic diastolic CHF (congestive heart failure) ([place] 03/10/2016   HLD (hyperlipidemia) 03/09/2016   Acute respiratory acidosis (HCC) 03/05/2016   Anemia, iron deficiency 03/03/2016   Cerebrovascular accident (CVA) due to thrombosis of precerebral artery ([place] 03/01/2016   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 ([place] 03/01/2016   Atherosclerosis of native arteries of the extremities with gangrene ([place] 12/08/2015   Anemia, chronic disease 04/27/2014   Unspecified hereditary and idiopathic peripheral neuropathy 03/14/2014   DM2 (diabetes mellitus, type 2) ([place] 02/14/2014   Aortic stenosis 02/04/2014   Closed left  subtrochanteric femur fracture ([place] 02/03/2014   Hip fracture, left ([place] 02/03/2014   Fibula fracture  02/03/2014   MTP instability 02/03/2014   Hip fracture ([place]) 02/03/2014    Janna Arch, PT, DPT  01/03/2022, 7:13 PM  [place] MAIN [facility] SERVICES [address] [place], [place], [address] Phone: [phone]   Fax:  [phone]  Name: [name] MRN: [id number] Date of Birth: 16-May-1941

## 2022-01-08 ENCOUNTER — Ambulatory Visit: Payer: Medicare HMO

## 2022-01-10 ENCOUNTER — Ambulatory Visit: Payer: Medicare HMO | Admitting: Physical Therapy

## 2022-01-15 ENCOUNTER — Ambulatory Visit: Payer: Medicare HMO | Admitting: Physical Therapy

## 2022-01-17 ENCOUNTER — Ambulatory Visit: Payer: Medicare HMO | Admitting: Physical Therapy

## 2022-01-22 ENCOUNTER — Encounter (INDEPENDENT_AMBULATORY_CARE_PROVIDER_SITE_OTHER): Payer: Medicare HMO

## 2022-01-22 ENCOUNTER — Ambulatory Visit (INDEPENDENT_AMBULATORY_CARE_PROVIDER_SITE_OTHER): Payer: Medicare HMO | Admitting: Nurse Practitioner

## 2022-01-22 ENCOUNTER — Ambulatory Visit: Payer: Medicare HMO

## 2022-01-24 ENCOUNTER — Ambulatory Visit: Payer: Medicare HMO | Admitting: Physical Therapy

## 2022-01-29 ENCOUNTER — Ambulatory Visit: Payer: Medicare HMO

## 2022-01-31 ENCOUNTER — Ambulatory Visit: Payer: Medicare HMO | Admitting: Physical Therapy

## 2022-02-01 ENCOUNTER — Ambulatory Visit (INDEPENDENT_AMBULATORY_CARE_PROVIDER_SITE_OTHER): Payer: Medicare HMO | Admitting: Nurse Practitioner

## 2022-02-01 ENCOUNTER — Encounter (INDEPENDENT_AMBULATORY_CARE_PROVIDER_SITE_OTHER): Payer: Self-pay | Admitting: Nurse Practitioner

## 2022-02-01 ENCOUNTER — Ambulatory Visit (INDEPENDENT_AMBULATORY_CARE_PROVIDER_SITE_OTHER): Payer: Medicare HMO

## 2022-02-01 VITALS — BP 118/66 | HR 70 | Resp 17

## 2022-02-01 DIAGNOSIS — I10 Essential (primary) hypertension: Secondary | ICD-10-CM

## 2022-02-01 DIAGNOSIS — E782 Mixed hyperlipidemia: Secondary | ICD-10-CM | POA: Diagnosis not present

## 2022-02-01 DIAGNOSIS — I739 Peripheral vascular disease, unspecified: Secondary | ICD-10-CM

## 2022-02-04 ENCOUNTER — Ambulatory Visit: Payer: Medicare HMO | Admitting: Physical Therapy

## 2022-02-06 ENCOUNTER — Ambulatory Visit: Payer: Medicare HMO

## 2022-02-11 ENCOUNTER — Ambulatory Visit: Payer: Medicare HMO | Attending: Family Medicine

## 2022-02-11 DIAGNOSIS — M6281 Muscle weakness (generalized): Secondary | ICD-10-CM | POA: Diagnosis not present

## 2022-02-11 DIAGNOSIS — R293 Abnormal posture: Secondary | ICD-10-CM

## 2022-02-11 DIAGNOSIS — R2689 Other abnormalities of gait and mobility: Secondary | ICD-10-CM

## 2022-02-11 DIAGNOSIS — R269 Unspecified abnormalities of gait and mobility: Secondary | ICD-10-CM | POA: Diagnosis present

## 2022-02-11 DIAGNOSIS — R278 Other lack of coordination: Secondary | ICD-10-CM

## 2022-02-11 DIAGNOSIS — R262 Difficulty in walking, not elsewhere classified: Secondary | ICD-10-CM

## 2022-02-11 DIAGNOSIS — R2681 Unsteadiness on feet: Secondary | ICD-10-CM

## 2022-02-11 NOTE — Therapy (Signed)
[place] MAIN [facility] SERVICES [address] [place], [place], [address] Phone: [phone]   Fax:  [phone]  Physical Therapy Treatment  Patient Details  Name: [name] MRN: [id number] Date of Birth: 23-Mar-1941 Referring Provider (PT): Sharin Grave   Encounter Date: 02/11/2022   PT End of Session - 02/11/22 1103     Visit Number 50    Number of Visits 27    Date for PT Re-Evaluation 01/10/22    Authorization Type [employer]; Medicaid    Authorization Time Period 11/05/21-02/01/22    Progress Note Due on Visit 41    PT Start Time 1022    PT Stop Time 1102    PT Time Calculation (min) 40 min    Activity Tolerance Patient tolerated treatment well    Behavior During Therapy WFL for tasks assessed/performed             Past Medical History:  Diagnosis Date   Acute heart failure ([place])    Aortic stenosis    CHF (congestive heart failure) ([place])    Complication of anesthesia    Hard to wake patient up after having anesthesia   Diabetes mellitus without complication ([place])    Diabetic neuropathy ([place])    Takes Lyrica   Edema of both legs    Takes Lasix   GERD (gastroesophageal reflux disease)    High cholesterol    HTN (hypertension)    Hypertension    PAD (peripheral artery disease) ([place])    Shortness of breath dyspnea    Spasm of back muscles    Stroke ([place])    Wound, open    Right great toe    Past Surgical History:  Procedure Laterality Date   AMPUTATION TOE Left 06/09/2019   Procedure: Left third toe and partial great toe amputation and debridement;  Surgeon: Katha Cabal, MD;  Location: [facility];  Service: Vascular;  Laterality: Left;   AMPUTATION TOE Left 04/06/2020   Procedure: AMPUTATION LEFT SECOND TOE;  Surgeon: Samara Deist, DPM;  Location: [facility];  Service: Podiatry;  Laterality: Left;   BYPASS GRAFT POPLITEAL TO TIBIAL Right 02/28/2016   Procedure: BYPASS GRAFT RIGHT BELOW KNEE POPLITEAL TO  PERONEAL USING REVERSED RIGHT GREATER SAPHENOUS VEIN;  Surgeon: Conrad [place], MD;  Location: [place];  Service: Vascular;  Laterality: Right;   CYST EXCISION     Abdomen   EYE SURGERY Bilateral    Cataract removal   INTRAMEDULLARY (IM) NAIL INTERTROCHANTERIC Left 02/04/2014   Procedure: INTRAMEDULLARY (IM) NAIL INTERTROCHANTRIC FEMORAL;  Surgeon: Mauri Pole, MD;  Location: [place];  Service: Orthopedics;  Laterality: Left;   IR GENERIC HISTORICAL  03/01/2016   IR ANGIO INTRA EXTRACRAN SEL COM CAROTID INNOMINATE UNI R MOD SED 03/01/2016 [name], MD [facility]   IR GENERIC HISTORICAL  03/01/2016   IR ENDOVASC INTRACRANIAL INF OTHER THAN THROMBO ART INC DIAG ANGIO 03/01/2016 [name], MD [facility]   IR GENERIC HISTORICAL  03/01/2016   IR INTRAVSC STENT CERV CAROTID W/O EMB-PROT MOD SED INC ANGIO 03/01/2016 [name], MD [facility]   IR GENERIC HISTORICAL  06/03/2016   IR RADIOLOGIST EVAL & MGMT 06/03/2016 [facility]   LOWER EXTREMITY ANGIOGRAPHY Left 02/09/2019   Procedure: LOWER EXTREMITY ANGIOGRAPHY;  Surgeon: Katha Cabal, MD;  Location: [place] [facility];  Service: Cardiovascular;  Laterality: Left;   LOWER EXTREMITY ANGIOGRAPHY Left 03/10/2019   Procedure: LOWER EXTREMITY ANGIOGRAPHY;  Surgeon: Delana Meyer,  Dolores Lory, MD;  Location: [place] [facility];  Service: Cardiovascular;  Laterality: Left;   LOWER EXTREMITY ANGIOGRAPHY Left 04/27/2019   Procedure: LOWER EXTREMITY ANGIOGRAPHY;  Surgeon: Katha Cabal, MD;  Location: [place] [facility];  Service: Cardiovascular;  Laterality: Left;   LOWER EXTREMITY ANGIOGRAPHY Left 06/08/2019   Procedure: Lower Extremity Angiography;  Surgeon: Katha Cabal, MD;  Location: [place] [facility];  Service: Cardiovascular;  Laterality: Left;   LOWER EXTREMITY ANGIOGRAPHY Left 04/03/2020   Procedure: Lower Extremity Angiography;  Surgeon: Algernon Huxley, MD;  Location: [place] [facility];  Service:  Cardiovascular;  Laterality: Left;   LOWER EXTREMITY ANGIOGRAPHY Right 10/20/2020   Procedure: Lower Extremity Angiography;  Surgeon: Katha Cabal, MD;  Location: [place] [facility];  Service: Cardiovascular;  Laterality: Right;   LOWER EXTREMITY ANGIOGRAPHY Left 01/23/2021   Procedure: LOWER EXTREMITY ANGIOGRAPHY;  Surgeon: Katha Cabal, MD;  Location: [place] [facility];  Service: Cardiovascular;  Laterality: Left;   LOWER EXTREMITY ANGIOGRAPHY Right 04/10/2021   Procedure: LOWER EXTREMITY ANGIOGRAPHY;  Surgeon: Katha Cabal, MD;  Location: [place] [facility];  Service: Cardiovascular;  Laterality: Right;   ORIF TOE FRACTURE Right 02/08/2014   Procedure: OPEN REDUCTION INTERNAL FIXATION Right METATARSAL  FRACTURE ;  Surgeon: Wylene Simmer, MD;  Location: [place];  Service: Orthopedics;  Laterality: Right;   PERIPHERAL VASCULAR CATHETERIZATION N/A 12/28/2015   Procedure: Abdominal Aortogram w/Lower Extremity;  Surgeon: Conrad Arroyo, MD;  Location: [place] [facility];  Service: Cardiovascular;  Laterality: N/A;   RADIOLOGY WITH ANESTHESIA N/A 03/01/2016   Procedure: RADIOLOGY WITH ANESTHESIA;  Surgeon: [name], MD;  Location: [place];  Service: Radiology;  Laterality: N/A;   VEIN HARVEST Right 02/28/2016   Procedure: RIGHT GREATER SAPHENOUS VEIN HARVEST;  Surgeon: Conrad Cien[place]as Terrace, MD;  Location: [place];  Service: Vascular;  Laterality: Right;    There were no vitals filed for this visit.   Subjective Assessment - 02/11/22 1050     Subjective Pt back after severla weeks hiatus. Husband reports he took her "out of therapy" for a while, because her health was not well, pt was not feeling well. He doe snot specify otherwise.    Patient is accompained by: Family member    Pertinent History 65yoF presenting to [place] to address recent gradual progressive decline in mobility, family goals for improved strength, to promote > participation in transfers to wheelchair and safety with  ADL performance. Recent illness includes admission to [facility] on 03/07/21-03/10/21 due to COVID infection c AMS and fever. Pt then underwent outpatient recanalization/limb salvage of RLE 04/10/2021; LLE salvage performed in June 2022. Other PMH: CHF, DM, HTN, CVA, COPD, multiple remote toe amputations, stg 2 sacral ulcer. PLOF includes 3-4 years bed bound status, hoyerlifts to WC, totalA for ADL performance. Pt is cared for by family, as well as professional caregiver services.    Currently in Pain? Yes    Pain Location Back   back and legs   Pain Orientation Right;Left             INTERVENTION 6/38/93 -heat application to low back and bilat knees/quads -P/ROM bilat knees flexion/extension x10 *pt unable to follow cues for relaxing knee extension which exacerbates pain -Attempted hip IR/ER P/ROM, pt attempting knee flexion extension, unable to follow cues for this activity despite several attempts -slight recline added to chair  -Seated trunk flexion, AA/ROM with cues for pt to use hands to pull  self into flexion *pt able to due 2 x3, but quickly loses motivation due to discomfort in low back from hip flexion ROM deficits -Seated trunk flexion pt using hands to pull trunk upright on WC arm rests 1x5  *educated husband on correct use of lap belt in [facility] for road safety    PT Education - 02/11/22 1105     Education Details pt not making much progress with goals in general    Person(s) Educated Spouse    Methods Explanation;Demonstration    Comprehension Need further instruction              PT Short Term Goals - 01/03/22 1348       PT SHORT TERM GOAL #1   Title Patient will be independent in home exercise program to improve strength/mobility for better functional independence with ADLs.    Baseline 10/6: to be initiated, 11/8: caregiver reports she is doing them 3x a day; 1/18: doesn't do much exercise; 10/01/2021= Husband reports she is doing better with her HEP and does better exercising  her her son. He states no questions at this time. 4/12: husband and family have been helping with HEP, doing some daily; 6/1: husband and son helping    Time 4    Period Weeks    Status Partially Met    Target Date 11/12/21               PT Long Term Goals - 01/03/22 1349       PT LONG TERM GOAL #1   Title Patient will increase FOTO score to equal to or greater than 44 to demonstrate statistically significant improvement in mobility and quality of life.    Baseline 10/6: 26, 11/8: 12%, 1/18: 12%, 4/12: deferred; 6/1: 12%    Time 8    Period Weeks    Status Not Met    Target Date 01/10/22      PT LONG TERM GOAL #2   Title Patient will roll L and R in bed with mod A for decreased caregiver burden for ADLs and toileting.    Baseline 10/6: per pt spouse, pt currently dependent, 11/8: mod A, inconsistent, 1/18: needs min A; 10/01/2021=Min//mod assist to right and back to left- Max VC for correct technique. Patient required physical assist with rolling especially moving her legs- she was able to reach and roll her upper torso well with min assist. 4/12: inconsistent max to mod A, able to initiate with UE, requires assistance for LE positioning 6/1: per husband continues to require assistance    Time 8    Period Weeks    Status Partially Met    Target Date 01/10/22      PT LONG TERM GOAL #3   Title Patient will sit upright in WC, with BUE support and without leaning on backrest for 10 minutes for ADL performance and pressure relief.    Baseline 10/6: pt exhibits core contraction but unable to sit upright, 11/8: able to sit upright for <20 sec with mod VCs for erect positioning, 1/18: able to hold for 60 sec with mod VCs. 10/01/2021=Patient was then assessed sitting at [facility]- without back support- unable to maintain > 30 sec without assistance. 4/12: able to sit forward in wheelchair with arms in lap for 10-15 sec, exhibits increased left lateral lean this session after missing a week of PT  6/1: patient refused to attempt    Time 8    Period Weeks    Status Unable to  assess    Target Date 01/10/22      PT LONG TERM GOAL #4   Title Pt will demonstrate ability to utilize BUEs to weight-shift to each side in WC and maintain weight shift for 30 seconds to assist in pressure relief and reduce risk of wound formation.    Baseline 10/6: pt currently unable, 11/8: unable to initiate weight shift, able to use UE but unable to shift hips without assistance; 1/18: requires max cues and min A for weight shift 4/12: able to use UE on arm rest and initiate weight shift but requires significant increased time, requires min A 6/1: patient refused to attempt    Time 8    Period Weeks    Status Unable to assess    Target Date 01/10/22      PT LONG TERM GOAL #5   Title Patient will be able to transfer sit<>Stand with LRAD, min A +1 to assist with repositioning and pressure relief as well as ADLs/dressing;    Baseline 4/12: requires mod A +2 6/1: patient refused to attempt this session    Time 8    Period Weeks    Status Unable to assess    Target Date 01/10/22                   Plan - 02/11/22 1105     Clinical Impression Statement Pt arrives late due to transportation. Continued with current plan of care as laid out in evaluation and recent prior sessions. All interventions tolerated as expected by Pryor Curia, however pt unable to participate in much of planned session either de to difficulty with following commands, or limited insight/motivation. Recovery intervals given as needed based on signs of exertion and/or pt request. Pt appears to be close to baseline at this time, however family will need further education on pt's prognosis. Pt's mobility needs continue to be successfully met at home at this time.    Personal Factors and Comorbidities Age;Time since onset of injury/illness/exacerbation;Comorbidity 3+;Past/Current Experience;Education;Social Background;Transportation;Sex;Fitness     Comorbidities diastolic congestive heart failure, diabetes mellitus, diabetic neuropathy, hypertension, hyperlipidemia, GERD, peripheral vascular disease, CVA. on 9/2 had amputation of L foot second toe MTP joint and LLE revascularization, recent hx of COVID19    Examination-Activity Limitations Bathing;Hygiene/Grooming;Squat;Bed Mobility;Stairs;Lift;Bend;Locomotion Level;Stand;Caring for [employer];Toileting;Carry;Self Feeding;Transfers;Continence;Dressing;Other;Sit    Examination-Participation Restrictions Cleaning;Shop;Community Activity;Meal Prep;Driving;Other    Stability/Clinical Decision Making Evolving/Moderate complexity    Clinical Decision Making High    Rehab Potential Poor    PT Frequency 2x / week    PT Duration 8 weeks    PT Treatment/Interventions ADLs/Self Care Home Management;DME Instruction;Functional mobility training;Therapeutic activities;Therapeutic exercise;Neuromuscular re-education;Patient/family education;Wheelchair mobility training;Passive range of motion;Energy conservation;Cryotherapy;Biofeedback;Electrical Stimulation;Iontophoresis 42m/ml Dexamethasone;Moist Heat;Traction;Ultrasound;Stair training;Cognitive remediation;Balance training;Orthotic Fit/Training;Manual techniques;Manual lymph drainage;Splinting;Taping;Vasopneumatic Device;Vestibular;Visual/perceptual remediation/compensation;Gait training    PT Next Visit Plan Consider DC from PT?    PT Home Exercise Plan Previous- Seated lateral weight shift pressure relief technique in wheelchair to be performed 5 days/week for 2 sets of 10 repetitions each side.  PT provided patient with picture of technique as well.  05/21/2021- Access Code: [email or web address];Access Code: [email or web address]   Consulted and Agree with Plan of Care Family member/caregiver    Family Member Consulted Spouse             Patient will benefit from skilled therapeutic intervention in order to improve the following deficits and impairments:   Decreased cognition, Decreased skin integrity, Impaired sensation, Decreased mobility, Postural dysfunction, Decreased activity tolerance, Decreased endurance,  Decreased range of motion, Decreased strength, Decreased balance, Decreased safety awareness, Increased edema, Impaired flexibility, Decreased coordination, Cardiopulmonary status limiting activity, Abnormal gait, Impaired UE functional use, Impaired tone, Improper body mechanics, Pain, Difficulty walking, Decreased knowledge of use of DME, Hypomobility, Impaired perceived functional ability  Visit Diagnosis: Muscle weakness (generalized)  Other abnormalities of gait and mobility  Unsteadiness on feet  Abnormality of gait and mobility  Other lack of coordination  Abnormal posture  Difficulty in walking, not elsewhere classified     Problem List Patient Active Problem List   Diagnosis Date Noted   Atherosclerosis of native arteries of extremity with rest pain ([place]) 04/20/2021   COVID-19 virus infection 03/07/2021   Acute febrile illness 03/07/2021   Hypoalbuminemia 03/07/2021   Hyperglycemia due to diabetes mellitus ([place]) 03/07/2021   Elevated troponin 03/07/2021   Constipation 03/07/2021   COPD (chronic obstructive pulmonary disease) (H[facility]) 10/20/2020   GERD (gastroesophageal reflux disease) 10/20/2020   Physical deconditioning 10/20/2020   Cellulitis 10/19/2020   Necrotic toes ([facility])    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)    Diabetic neuropathy (H[facility])    Gangrene of toe of both feet ([place]) 06/06/2019   Peripheral arterial disease ([place]) 04/27/2019   ESBL (extended spectrum beta-lactamase) producing bacteria infection 12/25/2018   PVD (peripheral vascular disease) ([place]) 09/12/2016   Ulcer of right lower extremity ([place]) 08/29/2016   Acute on chronic diastolic heart failure ([place]) 07/08/2016   HTN (hypertension) 07/05/2016   Sensorineural hearing loss (SNHL), 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   Chronic diastolic CHF  (congestive heart failure) ([place]) 03/10/2016   HLD (hyperlipidemia) 03/09/2016   Acute respiratory acidosis (HCC) 03/05/2016   Anemia, iron deficiency 03/03/2016   Cerebrovascular accident (CVA) due to thrombosis of precerebral artery ([place]) 03/01/2016   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 ([place]) 03/01/2016   Atherosclerosis of native arteries of the extremities with gangrene ([place]) 12/08/2015   Anemia, chronic disease 04/27/2014   Unspecified hereditary and idiopathic peripheral neuropathy 03/14/2014   DM2 (diabetes mellitus, type 2) ([place]) 02/14/2014   Aortic stenosis 02/04/2014   Closed left subtrochanteric femur fracture ([place]) 02/03/2014   Hip fracture, left ([place]) 02/03/2014   Fibula fracture 02/03/2014   MTP instability 02/03/2014   Hip fracture ([place]) 02/03/2014   7:43 AM, 02/12/22 Etta Grandchild, PT, DPT Physical Therapist - [facility]  Outpatient Physical Therapy- [place] [phone]     [place], PT 02/11/2022, 11:06 AM  [name] [address] [place], [place], [address] Phone: [phone]   Fax:  [phone]  Name: [name] MRN: [id number] Date of Birth: Aug 23, 1940

## 2022-02-13 ENCOUNTER — Ambulatory Visit: Payer: Medicare HMO

## 2022-02-13 DIAGNOSIS — M6281 Muscle weakness (generalized): Secondary | ICD-10-CM | POA: Diagnosis not present

## 2022-02-13 DIAGNOSIS — R278 Other lack of coordination: Secondary | ICD-10-CM

## 2022-02-13 DIAGNOSIS — R293 Abnormal posture: Secondary | ICD-10-CM

## 2022-02-13 DIAGNOSIS — R2689 Other abnormalities of gait and mobility: Secondary | ICD-10-CM

## 2022-02-13 DIAGNOSIS — R269 Unspecified abnormalities of gait and mobility: Secondary | ICD-10-CM

## 2022-02-13 DIAGNOSIS — R2681 Unsteadiness on feet: Secondary | ICD-10-CM

## 2022-02-13 NOTE — Therapy (Signed)
[facility] [address] [place], [place], [address] Phone: [phone]   Fax:  [phone]  Physical Therapy Treatment  Patient Details  Name: [name] MRN: [id number] Date of Birth: 01/21/1941 Referring Provider (PT): Adamo, Elana M   Encounter Date: 02/13/2022   PT End of Session - 02/13/22 1056     Visit Number 52    Number of Visits 65    Date for PT Re-Evaluation 01/10/22    Authorization Type [employer]; Medicaid    Authorization Time Period 11/05/21-02/01/22    PT Start Time 1015    PT Stop Time 1050   pt asks to leave several times, is tired   PT Time Calculation (min) 35 min    Activity Tolerance Patient limited by fatigue;Patient limited by pain    Behavior During Therapy Impulsive;Flat affect;Restless             Past Medical History:  Diagnosis Date   Acute heart failure (HCC)    Aortic stenosis    CHF (congestive heart failure) (HCC)    Complication of anesthesia    Hard to wake patient up after having anesthesia   Diabetes mellitus without complication (HCC)    Diabetic neuropathy (HCC)    Takes Lyrica   Edema of both legs    Takes Lasix   GERD (gastroesophageal reflux disease)    High cholesterol    HTN (hypertension)    Hypertension    PAD (peripheral artery disease) (HCC)    Shortness of breath dyspnea    Spasm of back muscles    Stroke (HCC)    Wound, open    Right great toe    Past Surgical History:  Procedure Laterality Date   AMPUTATION TOE Left 06/09/2019   Procedure: Left third toe and partial great toe amputation and debridement;  Surgeon: Schnier, Gregory G, MD;  Location: [facility];  Service: Vascular;  Laterality: Left;   AMPUTATION TOE Left 04/06/2020   Procedure: AMPUTATION LEFT SECOND TOE;  Surgeon: Fowler, Justin, DPM;  Location: [facility];  Service: Podiatry;  Laterality: Left;   BYPASS GRAFT POPLITEAL TO TIBIAL Right 02/28/2016   Procedure: BYPASS GRAFT RIGHT  BELOW KNEE POPLITEAL TO PERONEAL USING REVERSED RIGHT GREATER SAPHENOUS VEIN;  Surgeon: Brian L Chen, MD;  Location: [facility];  Service: Vascular;  Laterality: Right;   CYST EXCISION     Abdomen   EYE SURGERY Bilateral    Cataract removal   INTRAMEDULLARY (IM) NAIL INTERTROCHANTERIC Left 02/04/2014   Procedure: INTRAMEDULLARY (IM) NAIL INTERTROCHANTRIC FEMORAL;  Surgeon: Matthew D Olin, MD;  Location: [facility];  Service: Orthopedics;  Laterality: Left;   IR GENERIC HISTORICAL  03/01/2016   IR ANGIO INTRA EXTRACRAN SEL COM CAROTID INNOMINATE UNI R MOD SED 03/01/2016 Sanjeev Deveshwar, MD [facility]   IR GENERIC HISTORICAL  03/01/2016   IR ENDOVASC INTRACRANIAL INF OTHER THAN THROMBO ART INC DIAG ANGIO 03/01/2016 Sanjeev Deveshwar, MD [facility]   IR GENERIC HISTORICAL  03/01/2016   IR INTRAVSC STENT CERV CAROTID W/O EMB-PROT MOD SED INC ANGIO 03/01/2016 Sanjeev Deveshwar, MD [facility]   IR GENERIC HISTORICAL  06/03/2016   IR RADIOLOGIST EVAL & MGMT 06/03/2016 [facility]   LOWER EXTREMITY ANGIOGRAPHY Left 02/09/2019   Procedure: LOWER EXTREMITY ANGIOGRAPHY;  Surgeon: Schnier, Gregory G, MD;  Location: [facility];  Service: Cardiovascular;  Laterality: Left;   LOWER EXTREMITY ANGIOGRAPHY Left 03/10/2019   Procedure: LOWER EXTREMITY ANGIOGRAPHY;  Surgeon:   Schnier, Dolores Lory, MD;  Location: [place] [facility];  Service: Cardiovascular;  Laterality: Left;   LOWER EXTREMITY ANGIOGRAPHY Left 04/27/2019   Procedure: LOWER EXTREMITY ANGIOGRAPHY;  Surgeon: Katha Cabal, MD;  Location: [place] [facility];  Service: Cardiovascular;  Laterality: Left;   LOWER EXTREMITY ANGIOGRAPHY Left 06/08/2019   Procedure: Lower Extremity Angiography;  Surgeon: Katha Cabal, MD;  Location: [place] [facility];  Service: Cardiovascular;  Laterality: Left;   LOWER EXTREMITY ANGIOGRAPHY Left 04/03/2020   Procedure: Lower Extremity Angiography;  Surgeon: Algernon Huxley, MD;  Location: [place] [facility];   Service: Cardiovascular;  Laterality: Left;   LOWER EXTREMITY ANGIOGRAPHY Right 10/20/2020   Procedure: Lower Extremity Angiography;  Surgeon: Katha Cabal, MD;  Location: [place] [facility];  Service: Cardiovascular;  Laterality: Right;   LOWER EXTREMITY ANGIOGRAPHY Left 01/23/2021   Procedure: LOWER EXTREMITY ANGIOGRAPHY;  Surgeon: Katha Cabal, MD;  Location: [place] [facility];  Service: Cardiovascular;  Laterality: Left;   LOWER EXTREMITY ANGIOGRAPHY Right 04/10/2021   Procedure: LOWER EXTREMITY ANGIOGRAPHY;  Surgeon: Katha Cabal, MD;  Location: [place] [facility];  Service: Cardiovascular;  Laterality: Right;   ORIF TOE FRACTURE Right 02/08/2014   Procedure: OPEN REDUCTION INTERNAL FIXATION Right METATARSAL  FRACTURE ;  Surgeon: Wylene Simmer, MD;  Location: [facility];  Service: Orthopedics;  Laterality: Right;   PERIPHERAL VASCULAR CATHETERIZATION N/A 12/28/2015   Procedure: Abdominal Aortogram w/Lower Extremity;  Surgeon: Conrad El Portal, MD;  Location: [place] [facility];  Service: Cardiovascular;  Laterality: N/A;   RADIOLOGY WITH ANESTHESIA N/A 03/01/2016   Procedure: RADIOLOGY WITH ANESTHESIA;  Surgeon: [name], MD;  Location: [place];  Service: Radiology;  Laterality: N/A;   VEIN HARVEST Right 02/28/2016   Procedure: RIGHT GREATER SAPHENOUS VEIN HARVEST;  Surgeon: Conrad Fort Loudon, MD;  Location: [place];  Service: Vascular;  Laterality: Right;    There were no vitals filed for this visit.   Subjective Assessment - 02/13/22 1058     Subjective Pt/husband have no updates since prior visit.    Pertinent History 81yoF presenting to [facility] [place] to address recent gradual progressive decline in mobility, family goals for improved strength, to promote > participation in transfers to wheelchair and safety with ADL performance. Recent illness includes admission to [facility] on 03/07/21-03/10/21 due to COVID infection c AMS and fever. Pt then underwent outpatient recanalization/limb salvage of  RLE 04/10/2021; LLE salvage performed in June 2022. Other PMH: CHF, DM, HTN, CVA, COPD, multiple remote toe amputations, stg 2 sacral ulcer. PLOF includes 3-4 years bed bound status, hoyerlifts to WC, totalA for ADL performance. Pt is cared for by family, as well as professional caregiver services.    Patient Stated Goals to strengthen pt's trunk to improve mobility in chair, assist with ADL    Currently in Pain? Yes    Pain Score --   pt does not has mentation to use NPRS   Pain Location --   bilat anterior thighs, low bakc             Intervention 02/13/22  cable row 7.5lb 2x10  chest press 2x10 PVC  LAQ 2x10 2lb (minA on Right)  HS cable curls in WC 2x10 bilat 7.5lb seated marching and hip extension 12.5lb 1x10 bilat  basketball toss to husband x6       PT Education - 02/13/22 1057     Education Details hip and knee arthritic changes likely to not improve in  ROM from activity here    Person(s) Educated Spouse    Methods Explanation;Demonstration    Comprehension Verbalized understanding;Need further instruction              PT Short Term Goals - 01/03/22 1348       PT SHORT TERM GOAL #1   Title Patient will be independent in home exercise program to improve strength/mobility for better functional independence with ADLs.    Baseline 10/6: to be initiated, 11/8: caregiver reports she is doing them 3x a day; 1/18: doesn't do much exercise; 10/01/2021= Husband reports she is doing better with her HEP and does better exercising her her son. He states no questions at this time. 4/12: husband and family have been helping with HEP, doing some daily; 6/1: husband and son helping    Time 4    Period Weeks    Status Partially Met    Target Date 11/12/21               PT Long Term Goals - 01/03/22 1349       PT LONG TERM GOAL #1   Title Patient will increase FOTO score to equal to or greater than 44 to demonstrate statistically significant improvement in mobility and  quality of life.    Baseline 10/6: 26, 11/8: 12%, 1/18: 12%, 4/12: deferred; 6/1: 12%    Time 8    Period Weeks    Status Not Met    Target Date 01/10/22      PT LONG TERM GOAL #2   Title Patient will roll L and R in bed with mod A for decreased caregiver burden for ADLs and toileting.    Baseline 10/6: per pt spouse, pt currently dependent, 11/8: mod A, inconsistent, 1/18: needs min A; 10/01/2021=Min//mod assist to right and back to left- Max VC for correct technique. Patient required physical assist with rolling especially moving her legs- she was able to reach and roll her upper torso well with min assist. 4/12: inconsistent max to mod A, able to initiate with UE, requires assistance for LE positioning 6/1: per husband continues to require assistance    Time 8    Period Weeks    Status Partially Met    Target Date 01/10/22      PT LONG TERM GOAL #3   Title Patient will sit upright in WC, with BUE support and without leaning on backrest for 10 minutes for ADL performance and pressure relief.    Baseline 10/6: pt exhibits core contraction but unable to sit upright, 11/8: able to sit upright for <20 sec with mod VCs for erect positioning, 1/18: able to hold for 60 sec with mod VCs. 10/01/2021=Patient was then assessed sitting at EOM- without back support- unable to maintain > 30 sec without assistance. 4/12: able to sit forward in wheelchair with arms in lap for 10-15 sec, exhibits increased left lateral lean this session after missing a week of PT 6/1: patient refused to attempt    Time 8    Period Weeks    Status Unable to assess    Target Date 01/10/22      PT LONG TERM GOAL #4   Title Pt will demonstrate ability to utilize BUEs to weight-shift to each side in WC and maintain weight shift for 30 seconds to assist in pressure relief and reduce risk of wound formation.    Baseline 10/6: pt currently unable, 11/8: unable to initiate weight shift, able to use UE but unable   to shift hips without  assistance; 1/18: requires max cues and min A for weight shift 4/12: able to use UE on arm rest and initiate weight shift but requires significant increased time, requires min A 6/1: patient refused to attempt    Time 8    Period Weeks    Status Unable to assess    Target Date 01/10/22      PT LONG TERM GOAL #5   Title Patient will be able to transfer sit<>Stand with LRAD, min A +1 to assist with repositioning and pressure relief as well as ADLs/dressing;    Baseline 4/12: requires mod A +2 6/1: patient refused to attempt this session    Time 8    Period Weeks    Status Unable to assess    Target Date 01/10/22                   Plan - 02/13/22 1059     Clinical Impression Statement Continued use of heat to lowback and bilat quads again show sgood utility in improving tolerance to session, however pt is able to communicate twice that the hot pack at back is exceedingly hot adn uncomfortable, which may account for reports of back discomfort prior session wherein pt was not as desciptive with her discomfort. Pt introduced to several new leg exercercises today, most near the cable machine. Pt seems to do better with most multimodal cues this date, particularly for more familiar leg exercises, however BUE exercises are difficulty to extend beyond 3 reps despite modifications dues to discomfort and fatigue. Pt continues to present at her baseline, anticipate minimal improvements in functional independence going forward given the chronicicity of impairments and limited insight due to moderate cognitiive impairment.    Personal Factors and Comorbidities Age;Time since onset of injury/illness/exacerbation;Comorbidity 3+;Past/Current Experience;Education;Social Background;Transportation;Sex;Fitness    Comorbidities diastolic congestive heart failure, diabetes mellitus, diabetic neuropathy, hypertension, hyperlipidemia, GERD, peripheral vascular disease, CVA. on 9/2 had amputation of L foot second  toe MTP joint and LLE revascularization, recent hx of COVID19    Examination-Activity Limitations Bathing;Hygiene/Grooming;Squat;Bed Mobility;Stairs;Lift;Bend;Locomotion Level;Stand;Caring for Others;Reach Overhead;Toileting;Carry;Self Feeding;Transfers;Continence;Dressing;Other;Sit    Examination-Participation Restrictions Cleaning;Shop;Community Activity;Meal Prep;Driving;Other    Stability/Clinical Decision Making Evolving/Moderate complexity    Clinical Decision Making High    Rehab Potential Poor    PT Frequency 2x / week    PT Duration 8 weeks    PT Treatment/Interventions ADLs/Self Care Home Management;DME Instruction;Functional mobility training;Therapeutic activities;Therapeutic exercise;Neuromuscular re-education;Patient/family education;Wheelchair mobility training;Passive range of motion;Energy conservation;Cryotherapy;Biofeedback;Electrical Stimulation;Iontophoresis 4mg/ml Dexamethasone;Moist Heat;Traction;Ultrasound;Stair training;Cognitive remediation;Balance training;Orthotic Fit/Training;Manual techniques;Manual lymph drainage;Splinting;Taping;Vasopneumatic Device;Vestibular;Visual/perceptual remediation/compensation;Gait training    PT Next Visit Plan Consider DC from PT andor transition to HHPT?    PT Home Exercise Plan Previous- Seated lateral weight shift pressure relief technique in wheelchair to be performed 5 days/week for 2 sets of 10 repetitions each side.  PT provided patient with picture of technique as well.  05/21/2021- Access Code: [email or web address];Access Code: [email or web address]    Consulted and Agree with Plan of Care Family member/caregiver    Family Member Consulted Spouse             Patient will benefit from skilled therapeutic intervention in order to improve the following deficits and impairments:  Decreased cognition, Decreased skin integrity, Impaired sensation, Decreased mobility, Postural dysfunction, Decreased activity tolerance, Decreased endurance, Decreased range of  motion, Decreased strength, Decreased balance, Decreased safety awareness, Increased edema, Impaired flexibility, Decreased coordination, Cardiopulmonary status limiting activity, Abnormal gait, Impaired UE functional use, Impaired tone, Improper   body mechanics, Pain, Difficulty walking, Decreased knowledge of use of DME, Hypomobility, Impaired perceived functional ability  Visit Diagnosis: Muscle weakness (generalized)  Other abnormalities of gait and mobility  Unsteadiness on feet  Abnormality of gait and mobility  Other lack of coordination  Abnormal posture     Problem List Patient Active Problem List   Diagnosis Date Noted   Atherosclerosis of native arteries of extremity with rest pain (HCC) 04/20/2021   COVID-19 virus infection 03/07/2021   Acute febrile illness 03/07/2021   Hypoalbuminemia 03/07/2021   Hyperglycemia due to diabetes mellitus (HCC) 03/07/2021   Elevated troponin 03/07/2021   Constipation 03/07/2021   COPD (chronic obstructive pulmonary disease) (HCC) 10/20/2020   GERD (gastroesophageal reflux disease) 10/20/2020   Physical deconditioning 10/20/2020   Cellulitis 10/19/2020   Necrotic toes (HCC)    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)    Diabetic neuropathy (HCC)    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) 06/06/2019   Peripheral arterial disease (HCC) 04/27/2019   ESBL (extended spectrum beta-lactamase) producing bacteria infection 12/25/2018   PVD (peripheral vascular disease) (HCC) 09/12/2016   Ulcer of right lower extremity (HCC) 08/29/2016   Acute on chronic diastolic heart failure (HCC) 07/08/2016   HTN (hypertension) 07/05/2016   Sensorineural hearing loss (SNHL), 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   Chronic diastolic CHF (congestive heart failure) (HCC) 03/10/2016   HLD (hyperlipidemia) 03/09/2016   Acute respiratory acidosis (HCC) 03/05/2016   Anemia, iron deficiency 03/03/2016   Cerebrovascular accident (CVA) due to thrombosis of precerebral  artery (HCC) 03/01/2016   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/01/2016   Atherosclerosis of native arteries of the extremities with gangrene (HCC) 12/08/2015   Anemia, chronic disease 04/27/2014   Unspecified hereditary and idiopathic peripheral neuropathy 03/14/2014   DM2 (diabetes mellitus, type 2) (HCC) 02/14/2014   Aortic stenosis 02/04/2014   Closed left subtrochanteric femur fracture (HCC) 02/03/2014   Hip fracture, left (HCC) 02/03/2014   Fibula fracture 02/03/2014   MTP instability 02/03/2014   Hip fracture (HCC) 02/03/2014   11:06 AM, 02/13/22  C , PT, DPT Physical Therapist - [facility]  Outpatient Physical Therapy- Main Campus [phone]     , C, PT 02/13/2022, 11:04 AM  [place] [facility] [address] [place], [place], [address] Phone: [phone]   Fax:  [phone]  Name: Pollyanna V Mcelhinney MRN: [id number] Date of Birth: 07/18/1941    

## 2022-02-17 ENCOUNTER — Encounter (INDEPENDENT_AMBULATORY_CARE_PROVIDER_SITE_OTHER): Payer: Self-pay | Admitting: Nurse Practitioner

## 2022-02-17 NOTE — Progress Notes (Signed)
Subjective:    Patient ID: [name], female    DOB: [birth year], 81 y.o.   MRN: [id number] No chief complaint on file.   The patient returns to the office for followup and review of the noninvasive studies.   There have been no interval changes in lower extremity symptoms. No interval shortening of the patient's claudication distance or development of rest pain symptoms. No new ulcers or wounds have occurred since the last visit.  There have been no significant changes to the patient's overall health care.  The patient denies amaurosis fugax or recent TIA symptoms. There are no documented recent neurological changes noted. There is no history of DVT, PE or superficial thrombophlebitis. The patient denies recent episodes of angina or shortness of breath.   ABI Rt=1.19 and Lt=1.07  (previous ABI's Rt=1.36 and Lt=1.11) Duplex ultrasound of the monophasic tibial artery waveforms    Review of Systems  Neurological:  Positive for weakness.  All other systems reviewed and are negative.      Objective:   Physical Exam Vitals reviewed.  HENT:     Head: Normocephalic.  Cardiovascular:     Rate and Rhythm: Normal rate.     Pulses:          Dorsalis pedis pulses are detected w/ Doppler on the right side and detected w/ Doppler on the left side.       Posterior tibial pulses are detected w/ Doppler on the right side and detected w/ Doppler on the left side.  Pulmonary:     Effort: Pulmonary effort is normal.  Skin:    General: Skin is warm and dry.  Neurological:     Mental Status: She is alert and oriented to person, place, and time.     Motor: Weakness present.     Gait: Gait abnormal.  Psychiatric:        Attention and Perception: She is inattentive.        Mood and Affect: Mood normal.        Behavior: Behavior is cooperative.        Cognition and Memory: Cognition is impaired.        Judgment: Judgment is impulsive.     BP 118/66 (BP Location: Right Arm)   Pulse 70    Resp 17   Past Medical History:  Diagnosis Date   Acute heart failure (HCC)    Aortic stenosis    CHF (congestive heart failure) (HCC)    Complication of anesthesia    Hard to wake patient up after having anesthesia   Diabetes mellitus without complication (HCC)    Diabetic neuropathy (HCC)    Takes Lyrica   Edema of both legs    Takes Lasix   GERD (gastroesophageal reflux disease)    High cholesterol    HTN (hypertension)    Hypertension    PAD (peripheral artery disease) (HCC)    Shortness of breath dyspnea    Spasm of back muscles    Stroke (HCC)    Wound, open    Right great toe    Social History   Socioeconomic History   Marital status: Married    Spouse name: Not on file   Number of children: Not on file   Years of education: Not on file   Highest education level: Not on file  Occupational History   Not on file  Tobacco Use   Smoking status: Never   Smokeless tobacco: Never   Tobacco comments:  Never smoked  Substance and Sexual Activity   Alcohol use: No    Alcohol/week: 0.0 standard drinks of alcohol   Drug use: No   Sexual activity: Never  Other Topics Concern   Not on file  Social History Narrative   Not on file   Social Determinants of Health   Financial Resource Strain: Not on file  Food Insecurity: Not on file  Transportation Needs: Not on file  Physical Activity: Not on file  Stress: Not on file  Social Connections: Not on file  Intimate Partner Violence: Not on file    Past Surgical History:  Procedure Laterality Date   AMPUTATION TOE Left 06/09/2019   Procedure: Left third toe and partial great toe amputation and debridement;  Surgeon: Katha Cabal, MD;  Location: [facility];  Service: Vascular;  Laterality: Left;   AMPUTATION TOE Left 04/06/2020   Procedure: AMPUTATION LEFT SECOND TOE;  Surgeon: Samara Deist, DPM;  Location: [facility];  Service: Podiatry;  Laterality: Left;   BYPASS GRAFT POPLITEAL TO TIBIAL Right 02/28/2016    Procedure: BYPASS GRAFT RIGHT BELOW KNEE POPLITEAL TO PERONEAL USING REVERSED RIGHT GREATER SAPHENOUS VEIN;  Surgeon: Conrad [place], MD;  Location: [place];  Service: Vascular;  Laterality: Right;   CYST EXCISION     Abdomen   EYE SURGERY Bilateral    Cataract removal   INTRAMEDULLARY (IM) NAIL INTERTROCHANTERIC Left 02/04/2014   Procedure: INTRAMEDULLARY (IM) NAIL INTERTROCHANTRIC FEMORAL;  Surgeon: Mauri Pole, MD;  Location: [place];  Service: Orthopedics;  Laterality: Left;   IR GENERIC HISTORICAL  03/01/2016   IR ANGIO INTRA EXTRACRAN SEL COM CAROTID INNOMINATE UNI R MOD SED 03/01/2016 [name], MD [facility]   IR GENERIC HISTORICAL  03/01/2016   IR ENDOVASC INTRACRANIAL INF OTHER THAN THROMBO ART INC DIAG ANGIO 03/01/2016 [name], MD [facility]   IR GENERIC HISTORICAL  03/01/2016   IR INTRAVSC STENT CERV CAROTID W/O EMB-PROT MOD SED INC ANGIO 03/01/2016 [name], MD [facility]   IR GENERIC HISTORICAL  06/03/2016   IR RADIOLOGIST EVAL & MGMT 06/03/2016 [facility]   LOWER EXTREMITY ANGIOGRAPHY Left 02/09/2019   Procedure: LOWER EXTREMITY ANGIOGRAPHY;  Surgeon: Katha Cabal, MD;  Location: [place] [facility];  Service: Cardiovascular;  Laterality: Left;   LOWER EXTREMITY ANGIOGRAPHY Left 03/10/2019   Procedure: LOWER EXTREMITY ANGIOGRAPHY;  Surgeon: Katha Cabal, MD;  Location: [place] [facility];  Service: Cardiovascular;  Laterality: Left;   LOWER EXTREMITY ANGIOGRAPHY Left 04/27/2019   Procedure: LOWER EXTREMITY ANGIOGRAPHY;  Surgeon: Katha Cabal, MD;  Location: [place] [facility];  Service: Cardiovascular;  Laterality: Left;   LOWER EXTREMITY ANGIOGRAPHY Left 06/08/2019   Procedure: Lower Extremity Angiography;  Surgeon: Katha Cabal, MD;  Location: [place] [facility];  Service: Cardiovascular;  Laterality: Left;   LOWER EXTREMITY ANGIOGRAPHY Left 04/03/2020   Procedure: Lower Extremity Angiography;  Surgeon: Algernon Huxley, MD;   Location: [place] [facility];  Service: Cardiovascular;  Laterality: Left;   LOWER EXTREMITY ANGIOGRAPHY Right 10/20/2020   Procedure: Lower Extremity Angiography;  Surgeon: Katha Cabal, MD;  Location: [place] [facility];  Service: Cardiovascular;  Laterality: Right;   LOWER EXTREMITY ANGIOGRAPHY Left 01/23/2021   Procedure: LOWER EXTREMITY ANGIOGRAPHY;  Surgeon: Katha Cabal, MD;  Location: [place] [facility];  Service: Cardiovascular;  Laterality: Left;   LOWER EXTREMITY ANGIOGRAPHY Right 04/10/2021   Procedure: LOWER EXTREMITY ANGIOGRAPHY;  Surgeon: Katha Cabal, MD;  Location: [place] [facility];  Service: Cardiovascular;  Laterality: Right;   ORIF TOE FRACTURE Right 02/08/2014   Procedure: OPEN REDUCTION INTERNAL FIXATION Right METATARSAL  FRACTURE ;  Surgeon: Wylene Simmer, MD;  Location: [place];  Service: Orthopedics;  Laterality: Right;   PERIPHERAL VASCULAR CATHETERIZATION N/A 12/28/2015   Procedure: Abdominal Aortogram w/Lower Extremity;  Surgeon: Conrad [place], MD;  Location: [place] [facility];  Service: Cardiovascular;  Laterality: N/A;   RADIOLOGY WITH ANESTHESIA N/A 03/01/2016   Procedure: RADIOLOGY WITH ANESTHESIA;  Surgeon: [name], MD;  Location: [place];  Service: Radiology;  Laterality: N/A;   VEIN HARVEST Right 02/28/2016   Procedure: RIGHT GREATER SAPHENOUS VEIN HARVEST;  Surgeon: Conrad [place], MD;  Location: [place];  Service: Vascular;  Laterality: Right;    Family History  Problem Relation Age of Onset   Diabetes Other    Liver disease Mother    CVA Father    Diabetes Father    Hypertension Father     Allergies  Allergen Reactions   Eggs Or Egg-Derived Products Shortness Of Breath   Iodine Anaphylaxis   Penicillins Anaphylaxis    Tolerates ceftriaxone, cefazolin Did it involve swelling of the face/tongue/throat, SOB, or low BP? Unknown Did it involve sudden or severe rash/hives, skin peeling, or any reaction on the inside of your mouth or  nose? Unknown Did you need to seek medical attention at a hospital or doctor's office? Unknown When did it last happen?      Unknown If all above answers are "NO", may proceed with cephalosporin use.    Shellfish Allergy Anaphylaxis   Sulfa Antibiotics Anaphylaxis   Sulfacetamide Sodium Anaphylaxis   Sulfasalazine Anaphylaxis   Morphine And Related Other (See Comments)    Altered mental status       Latest Ref Rng & Units 11/04/2021    6:01 PM 06/05/2021   12:53 PM 03/10/2021    6:29 AM  CBC  WBC 4.0 - 10.5 K/uL 14.6  9.9  4.3   Hemoglobin 12.0 - 15.0 g/dL 12.1  10.7  9.8   Hematocrit 36.0 - 46.0 % 40.2  33.8  31.4   Platelets 150 - 400 K/uL 210  261  148       CMP     Component Value Date/Time   NA 139 11/04/2021 1801   K 4.6 11/04/2021 1801   CL 105 11/04/2021 1801   CO2 25 11/04/2021 1801   GLUCOSE 188 (H) 11/04/2021 1801   BUN 14 11/04/2021 1801   CREATININE 0.58 11/04/2021 1801   CALCIUM 9.4 11/04/2021 1801   PROT 6.9 11/04/2021 1801   ALBUMIN 3.5 11/04/2021 1801   AST 22 11/04/2021 1801   ALT 22 11/04/2021 1801   ALKPHOS 72 11/04/2021 1801   BILITOT 0.5 11/04/2021 1801   GFRNONAA >60 11/04/2021 1801   GFRAA >60 04/06/2020 0535     VAS [place] ABI WITH/WO TBI  Result Date: 02/11/2022  LOWER EXTREMITY DOPPLER STUDY Patient Name:  RUCHA WISSINGER  Date of Exam:   02/01/2022 Medical Rec #: [id number]      Accession #:    [id number] Date of Birth: February 04, 1941       Patient Gender: F Patient Age:   63 years Exam Location:  [facility] Procedure:      VAS [place] ABI WITH/WO TBI Referring Phys: GREGORY SCHNIER --------------------------------------------------------------------------------  Indications: Peripheral artery disease. High Risk Factors: Hypertension, hyperlipidemia, Diabetes. Other Factors: 03/10/2019 Left PTA popliteal and  ATA.  Vascular Interventions: 04/27/2019: Ultrasound Guided access to the Distal Right                         Anterior Tibial Artery. PTA of  the Right SFA and                         Popliteal Artery. Crosser Atherectomy with PTA of the                         Right ATA.                         06/08/2019: PTA of the Left SFA and Popliteal Artery.                         PTA of the Left Anterior Tibial Artery.                         06/09/2019: Amputation of the Left First and Third Toe;                         04/10/21: Right SFA/popliteal & ATA                         thrombectomy/PTA/stent;. Limitations: Today's exam was limited due to patient in wheelchair. Performing Technologist: Delorise Shiner RVT  Examination Guidelines: A complete evaluation includes at minimum, Doppler waveform signals and systolic blood pressure reading at the level of bilateral brachial, anterior tibial, and posterior tibial arteries, when vessel segments are accessible. Bilateral testing is considered an integral part of a complete examination. Photoelectric Plethysmograph (PPG) waveforms and toe systolic pressure readings are included as required and additional duplex testing as needed. Limited examinations for reoccurring indications may be performed as noted.  ABI Findings: +---------+------------------+-----+----------+--------+ Right    Rt Pressure (mmHg)IndexWaveform  Comment  +---------+------------------+-----+----------+--------+ Brachial 139                                       +---------+------------------+-----+----------+--------+ ATA      160               1.15 monophasic         +---------+------------------+-----+----------+--------+ PTA      165               1.19 monophasic         +---------+------------------+-----+----------+--------+ Great Toe108               0.78                    +---------+------------------+-----+----------+--------+ +---------+------------------+-----+-------------------+---------+ Left     Lt Pressure (mmHg)IndexWaveform           Comment    +---------+------------------+-----+-------------------+---------+ Brachial 132                                                 +---------+------------------+-----+-------------------+---------+ ATA      149               1.07 monophasic                   +---------+------------------+-----+-------------------+---------+  PTA      131               0.94 dampened monophasic          +---------+------------------+-----+-------------------+---------+ PERO     149               1.07 monophasic                   +---------+------------------+-----+-------------------+---------+ Great Toe                                          amputated +---------+------------------+-----+-------------------+---------+ +-------+-----------+-----------+------------+------------+ ABI/TBIToday's ABIToday's TBIPrevious ABIPrevious TBI +-------+-----------+-----------+------------+------------+ Right  1.19       0.78       1.36        Not obtained +-------+-----------+-----------+------------+------------+ Left   1.07       Amputation 1.11        amputation   +-------+-----------+-----------+------------+------------+ Arterial wall calcification precludes accurate ankle pressures and ABIs. Bilateral ABIs appear essentially unchanged compared to prior study on 07/16/2021.  Summary: Right: Resting right ankle-brachial index is within normal range. No evidence of significant right lower extremity arterial disease. The right toe-brachial index is normal. Although ankle brachial indices are within normal limits (0.95-1.29), arterial Doppler waveforms at the ankle suggest some component of arterial occlusive disease. Left: Resting left ankle-brachial index is within normal range. No evidence of significant left lower extremity arterial disease. *See table(s) above for measurements and observations.  Electronically signed by Hortencia Pilar MD on 02/11/2022 at 5:20:01 PM.    Final        Assessment  & Plan:   1. Peripheral arterial disease ([place]) Patient's lower extremities are much improved.  The previous open wounds have healed.  There have been no development of new wounds.  Patient continues to work with physical therapy without issue.  We will have the patient return in 6 months or sooner if issues arise. - VAS [place] ABI WITH/WO TBI  2. Mixed hyperlipidemia Continue statin as ordered and reviewed, no changes at this time   3. Primary hypertension Continue antihypertensive medications as already ordered, these medications have been reviewed and there are no changes at this time.    Current Outpatient Medications on File Prior to Visit  Medication Sig Dispense Refill   acetaminophen (TYLENOL) 500 MG tablet Take 1,000 mg by mouth every 6 (six) hours as needed for mild pain.     amLODipine (NORVASC) 10 MG tablet Take 10 mg by mouth daily.     aspirin EC 81 MG EC tablet Take 1 tablet (81 mg total) by mouth daily.     atorvastatin (LIPITOR) 20 MG tablet Take 1 tablet (20 mg total) by mouth daily. (Patient taking differently: Take 20 mg by mouth at bedtime.) 30 tablet 11   budesonide (PULMICORT) 0.25 MG/2ML nebulizer solution Take 2 mLs (0.25 mg total) by nebulization 2 (two) times daily. 60 mL 12   carbamide peroxide (DEBROX) 6.5 % OTIC solution Place 5 drops into both ears 2 (two) times daily as needed (ear wax).     Carboxymethylcellul-Glycerin (LUBRICATING EYE DROPS OP) Place 1 drop into both eyes daily as needed (dry eyes).     carvedilol (COREG) 6.25 MG tablet Take 6.25 mg by mouth 2 (two) times daily with a meal.     cefdinir (OMNICEF) 300 MG capsule Take 1 capsule (300 mg total)  by mouth 2 (two) times daily. 10 capsule 0   cetirizine (ZYRTEC) 10 MG tablet Take 10 mg by mouth daily.     cholecalciferol (VITAMIN D3) 25 MCG (1000 UNIT) tablet Take 1,000 Units by mouth daily.     clopidogrel (PLAVIX) 75 MG tablet Take 1 tablet (75 mg total) by mouth daily with breakfast. 30 tablet 0    diclofenac Sodium (VOLTAREN) 1 % GEL Apply 1 application topically 4 (four) times daily as needed (pain).     docusate sodium (COLACE) 100 MG capsule Take 100 mg by mouth daily.     fluticasone (FLONASE) 50 MCG/ACT nasal spray Place 1 spray into both nostrils daily as needed for allergies.      gabapentin (NEURONTIN) 300 MG capsule Take 300 mg by mouth at bedtime.     hydrALAZINE (APRESOLINE) 25 MG tablet Take 1 tablet (25 mg total) by mouth every 8 (eight) hours. 60 tablet 0   insulin glargine (LANTUS) 100 UNIT/ML injection Inject 0.1 mLs (10 Units total) into the skin at bedtime. (Patient taking differently: Inject 18 Units into the skin at bedtime.)     magnesium hydroxide (MILK OF MAGNESIA) 400 MG/5ML suspension Take 15 mLs by mouth daily as needed for mild constipation.     magnesium oxide (MAG-OX) 400 MG tablet Take 400 mg by mouth 2 (two) times daily.      modafinil (PROVIGIL) 200 MG tablet Take 200 mg by mouth daily.     NYAMYC powder Apply 1 application topically daily.     omeprazole (PRILOSEC) 20 MG capsule Take 1 capsule (20 mg total) by mouth daily.     polyethylene glycol (MIRALAX / GLYCOLAX) packet Take 17 g by mouth 2 (two) times daily. (Patient taking differently: Take 17 g by mouth daily as needed for moderate constipation.) 14 each 0   potassium chloride SA (K-DUR,KLOR-CON) 20 MEQ tablet Take 20 mEq by mouth daily.     torsemide (DEMADEX) 20 MG tablet Take 1 tablet (20 mg total) by mouth 2 (two) times daily.     Vitamin E 180 MG CAPS Take 180 mg by mouth daily.     No current facility-administered medications on file prior to visit.    There are no Patient Instructions on file for this visit. No follow-ups on file.   Kris Hartmann, NP

## 2022-02-18 ENCOUNTER — Ambulatory Visit: Payer: Medicare HMO | Admitting: Physical Therapy

## 2022-02-18 ENCOUNTER — Encounter: Payer: Self-pay | Admitting: Physical Therapy

## 2022-02-18 DIAGNOSIS — R278 Other lack of coordination: Secondary | ICD-10-CM

## 2022-02-18 DIAGNOSIS — M6281 Muscle weakness (generalized): Secondary | ICD-10-CM

## 2022-02-18 DIAGNOSIS — R293 Abnormal posture: Secondary | ICD-10-CM

## 2022-02-18 DIAGNOSIS — R2689 Other abnormalities of gait and mobility: Secondary | ICD-10-CM

## 2022-02-18 DIAGNOSIS — R269 Unspecified abnormalities of gait and mobility: Secondary | ICD-10-CM

## 2022-02-18 DIAGNOSIS — R262 Difficulty in walking, not elsewhere classified: Secondary | ICD-10-CM

## 2022-02-18 DIAGNOSIS — R2681 Unsteadiness on feet: Secondary | ICD-10-CM

## 2022-02-18 NOTE — Therapy (Signed)
OUTPATIENT PHYSICAL THERAPY TREATMENT NOTE   Patient Name: [name] MRN: [id number] DOB:Aug 21, 1940, 82 y.o., female Today's Date: 02/18/2022  PCP: Beverlyn Roux MD REFERRING PROVIDER: Beverlyn Roux MD   PT End of Session - 02/18/22 1204     Visit Number [id number]    Number of Visits 72    Date for PT Re-Evaluation 03/11/22    Authorization Type [employer]; Medicaid    Authorization Time Period 11/05/21-02/01/22    PT Start Time 1148    PT Stop Time 1230    PT Time Calculation (min) 42 min    Activity Tolerance Patient limited by fatigue;Patient limited by pain    Behavior During Therapy Impulsive;Flat affect;Restless             Past Medical History:  Diagnosis Date   Acute heart failure (HCC)    Aortic stenosis    CHF (congestive heart failure) (HCC)    Complication of anesthesia    Hard to wake patient up after having anesthesia   Diabetes mellitus without complication ([place])    Diabetic neuropathy ([place])    Takes Lyrica   Edema of both legs    Takes Lasix   GERD (gastroesophageal reflux disease)    High cholesterol    HTN (hypertension)    Hypertension    PAD (peripheral artery disease) (HCC)    Shortness of breath dyspnea    Spasm of back muscles    Stroke (HCC)    Wound, open    Right great toe   Past Surgical History:  Procedure Laterality Date   AMPUTATION TOE Left 06/09/2019   Procedure: Left third toe and partial great toe amputation and debridement;  Surgeon: Katha Cabal, MD;  Location: [facility];  Service: Vascular;  Laterality: Left;   AMPUTATION TOE Left 04/06/2020   Procedure: AMPUTATION LEFT SECOND TOE;  Surgeon: Samara Deist, DPM;  Location: [facility];  Service: Podiatry;  Laterality: Left;   BYPASS GRAFT POPLITEAL TO TIBIAL Right 02/28/2016   Procedure: BYPASS GRAFT RIGHT BELOW KNEE POPLITEAL TO PERONEAL USING REVERSED RIGHT GREATER SAPHENOUS VEIN;  Surgeon: Conrad Dothan, MD;  Location: [place];  Service: Vascular;  Laterality: Right;   CYST  EXCISION     Abdomen   EYE SURGERY Bilateral    Cataract removal   INTRAMEDULLARY (IM) NAIL INTERTROCHANTERIC Left 02/04/2014   Procedure: INTRAMEDULLARY (IM) NAIL INTERTROCHANTRIC FEMORAL;  Surgeon: Mauri Pole, MD;  Location: [place];  Service: Orthopedics;  Laterality: Left;   IR GENERIC HISTORICAL  03/01/2016   IR ANGIO INTRA EXTRACRAN SEL COM CAROTID INNOMINATE UNI R MOD SED 03/01/2016 [name], MD [facility]   IR GENERIC HISTORICAL  03/01/2016   IR ENDOVASC INTRACRANIAL INF OTHER THAN THROMBO ART INC DIAG ANGIO 03/01/2016 [name], MD [facility]   IR GENERIC HISTORICAL  03/01/2016   IR INTRAVSC STENT CERV CAROTID W/O EMB-PROT MOD SED INC ANGIO 03/01/2016 [name], MD [facility]   IR GENERIC HISTORICAL  06/03/2016   IR RADIOLOGIST EVAL & MGMT 06/03/2016 [facility]   LOWER EXTREMITY ANGIOGRAPHY Left 02/09/2019   Procedure: LOWER EXTREMITY ANGIOGRAPHY;  Surgeon: Katha Cabal, MD;  Location: [place] [facility];  Service: Cardiovascular;  Laterality: Left;   LOWER EXTREMITY ANGIOGRAPHY Left 03/10/2019   Procedure: LOWER EXTREMITY ANGIOGRAPHY;  Surgeon: Katha Cabal, MD;  Location: [place] [facility];  Service: Cardiovascular;  Laterality: Left;   LOWER EXTREMITY ANGIOGRAPHY Left 04/27/2019   Procedure: LOWER EXTREMITY ANGIOGRAPHY;  Surgeon:  Schnier, Dolores Lory, MD;  Location: [place] [facility];  Service: Cardiovascular;  Laterality: Left;   LOWER EXTREMITY ANGIOGRAPHY Left 06/08/2019   Procedure: Lower Extremity Angiography;  Surgeon: Katha Cabal, MD;  Location: [place] [facility];  Service: Cardiovascular;  Laterality: Left;   LOWER EXTREMITY ANGIOGRAPHY Left 04/03/2020   Procedure: Lower Extremity Angiography;  Surgeon: Algernon Huxley, MD;  Location: [place] [facility];  Service: Cardiovascular;  Laterality: Left;   LOWER EXTREMITY ANGIOGRAPHY Right 10/20/2020   Procedure: Lower Extremity Angiography;  Surgeon: Katha Cabal, MD;   Location: [place] [facility];  Service: Cardiovascular;  Laterality: Right;   LOWER EXTREMITY ANGIOGRAPHY Left 01/23/2021   Procedure: LOWER EXTREMITY ANGIOGRAPHY;  Surgeon: Katha Cabal, MD;  Location: [place] [facility];  Service: Cardiovascular;  Laterality: Left;   LOWER EXTREMITY ANGIOGRAPHY Right 04/10/2021   Procedure: LOWER EXTREMITY ANGIOGRAPHY;  Surgeon: Katha Cabal, MD;  Location: [place] [facility];  Service: Cardiovascular;  Laterality: Right;   ORIF TOE FRACTURE Right 02/08/2014   Procedure: OPEN REDUCTION INTERNAL FIXATION Right METATARSAL  FRACTURE ;  Surgeon: Wylene Simmer, MD;  Location: [place];  Service: Orthopedics;  Laterality: Right;   PERIPHERAL VASCULAR CATHETERIZATION N/A 12/28/2015   Procedure: Abdominal Aortogram w/Lower Extremity;  Surgeon: Conrad [place], MD;  Location: [place] [facility];  Service: Cardiovascular;  Laterality: N/A;   RADIOLOGY WITH ANESTHESIA N/A 03/01/2016   Procedure: RADIOLOGY WITH ANESTHESIA;  Surgeon: [name], MD;  Location: [place];  Service: Radiology;  Laterality: N/A;   VEIN HARVEST Right 02/28/2016   Procedure: RIGHT GREATER SAPHENOUS VEIN HARVEST;  Surgeon: Conrad Foster, MD;  Location: [place];  Service: Vascular;  Laterality: Right;   Patient Active Problem List   Diagnosis Date Noted   Atherosclerosis of native arteries of extremity with rest pain ([place]) 04/20/2021   COVID-19 virus infection 03/07/2021   Acute febrile illness 03/07/2021   Hypoalbuminemia 03/07/2021   Hyperglycemia due to diabetes mellitus ([place]) 03/07/2021   Elevated troponin 03/07/2021   Constipation 03/07/2021   COPD (chronic obstructive pulmonary disease) ([place]) 10/20/2020   GERD (gastroesophageal reflux disease) 10/20/2020   Physical deconditioning 10/20/2020   Cellulitis 10/19/2020   Necrotic toes (HCC)    Insulin dependent diabetes mellitus type IA ([place])    Diabetic neuropathy ([place])    Gangrene of toe of both feet ([place]) 06/06/2019   Peripheral  arterial disease ([place]) 04/27/2019   ESBL (extended spectrum beta-lactamase) producing bacteria infection 12/25/2018   PVD (peripheral vascular disease) ([place]) 09/12/2016   Ulcer of right lower extremity ([place]) 08/29/2016   Acute on chronic diastolic heart failure ([place]) 07/08/2016   HTN (hypertension) 07/05/2016   Sensorineural hearing loss (SNHL), 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   Chronic diastolic CHF (congestive heart failure) ([place]) 03/10/2016   HLD (hyperlipidemia) 03/09/2016   Acute respiratory acidosis ([place]) 03/05/2016   Anemia, iron deficiency 03/03/2016   Cerebrovascular accident (CVA) due to thrombosis of precerebral artery ([place]) 03/01/2016   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 ([place]) 03/01/2016   Atherosclerosis of native arteries of the extremities with gangrene ([place]) 12/08/2015   Anemia, chronic disease 04/27/2014   Unspecified hereditary and idiopathic peripheral neuropathy 03/14/2014   DM2 (diabetes mellitus, type 2) ([place]) 02/14/2014   Aortic stenosis 02/04/2014   Closed left subtrochanteric femur fracture ([place]) 02/03/2014   Hip fracture, left ([place]) 02/03/2014   Fibula fracture 02/03/2014   MTP instability 02/03/2014   Hip fracture ([place]) 02/03/2014  REFERRING DIAG: Deconditioning;   THERAPY DIAG:  Muscle weakness (generalized)  Other abnormalities of gait and mobility  Unsteadiness on feet  Abnormality of gait and mobility  Other lack of coordination  Abnormal posture  Difficulty in walking, not elsewhere classified  Rationale for Evaluation and Treatment Rehabilitation  PERTINENT HISTORY: 81yoF presenting to [facility] to address recent gradual progressive decline in mobility, family goals for improved strength, to promote > participation in transfers to wheelchair and safety with ADL performance. Recent illness includes admission to [facility] on 03/07/21-03/10/21 due to COVID infection c AMS and fever. Pt then underwent outpatient recanalization/limb  salvage of RLE 04/10/2021; LLE salvage performed in June 2022. Other PMH: CHF, DM, HTN, CVA, COPD, multiple remote toe amputations, stg 2 sacral ulcer. PLOF includes 3-4 years bed bound status, hoyerlifts to WC, totalA for ADL performance. Pt is cared for by family, as well as professional caregiver services.  PRECAUTIONS: Fall Risk  SUBJECTIVE: Caregiver reports things are going okay. Patient states, "My bottom hurts." She reports pain in her bottom and thinks she needs to move her bowels.   PAIN:  Are you having pain? Yes, Patient unable to rate, reports severe pain in bottom and BLE legs     TODAY'S TREATMENT:  Pt transferred wheelchair to mat table with hoyer lift, dependent transfer;  Patient supine on mat table with wedge and 2 pillows;  Caregiver changed patient's diaper   Patient educated in rolling to help with lower body dressing Patient unable to pull self over in rolling despite multiple attempts/verbal cues. She requested assistance. Pt mod A +1 for rolling side/side  Caregiver reports he helps do most of patient's ADLs. He does her HEP  Instructed patient in LE exercise: SLR hip flexion- pt able to initiate hip flexion ROM but unable to lift leg off table She completed x10 reps with AAROM for increased ROM; Educated caregiver and patient in importance of having patient do most of exercise to increase strengthening;   Instructed patient in bridge x10 reps, partial ROM- unable to fully lift hip off table; Pt/careful educated in importance of hip extension to work on increasing ROM to allow patient to participate in lower body dressing- lifting hips for pulling pants up hips;   Patient and caregiver educated on plan of care. PT will work with patient for one more month and if she fails to make progress then she will be discharge from therapy. Patient is limited with dementia and lack of initiative to do exercise. Caregiver is quick to step in and do activities for patient  and he requires education to step back and allow patient to be more active in her care.   She was dependent to transfer hoyer from mat table to wheelchair +2 for positioning; Patient continues to complain of buttocks pain with sitting; PT realized her pad was removed from wheelchair;   PATIENT EDUCATION: Education details: plan of care/exercise/positioning;  Person educated: Patient and Spouse Education method: Explanation, Demonstration, and Verbal cues Education comprehension: verbalized understanding, returned demonstration, verbal cues required, and needs further education   HOME EXERCISE PROGRAM: Reinforced importance of letting patient do active LE ROM/strengthening at home.  Continue as previously given;    PT Short Term Goals - 01/03/22 1348       PT SHORT TERM GOAL #1   Title Patient will be independent in home exercise program to improve strength/mobility for better functional independence with ADLs.    Baseline 10/6: to be initiated, 11/8: caregiver reports she  is doing them 3x a day; 1/18: doesn't do much exercise; 10/01/2021= Husband reports she is doing better with her HEP and does better exercising her her son. He states no questions at this time. 4/12: husband and family have been helping with HEP, doing some daily; 6/1: husband and son helping    Time 4    Period Weeks    Status Partially Met    Target Date 11/12/21              PT Long Term Goals - 01/03/22 1349       PT LONG TERM GOAL #1   Title Patient will increase FOTO score to equal to or greater than 44 to demonstrate statistically significant improvement in mobility and quality of life.    Baseline 10/6: 26, 11/8: 12%, 1/18: 12%, 4/12: deferred; 6/1: 12%    Time 8    Period Weeks    Status Not Met    Target Date 01/10/22      PT LONG TERM GOAL #2   Title Patient will roll L and R in bed with mod A for decreased caregiver burden for ADLs and toileting.    Baseline 10/6: per pt spouse, pt currently  dependent, 11/8: mod A, inconsistent, 1/18: needs min A; 10/01/2021=Min//mod assist to right and back to left- Max VC for correct technique. Patient required physical assist with rolling especially moving her legs- she was able to reach and roll her upper torso well with min assist. 4/12: inconsistent max to mod A, able to initiate with UE, requires assistance for LE positioning 6/1: per husband continues to require assistance    Time 8    Period Weeks    Status Partially Met    Target Date 01/10/22      PT LONG TERM GOAL #3   Title Patient will sit upright in WC, with BUE support and without leaning on backrest for 10 minutes for ADL performance and pressure relief.    Baseline 10/6: pt exhibits core contraction but unable to sit upright, 11/8: able to sit upright for <20 sec with mod VCs for erect positioning, 1/18: able to hold for 60 sec with mod VCs. 10/01/2021=Patient was then assessed sitting at [facility]- without back support- unable to maintain > 30 sec without assistance. 4/12: able to sit forward in wheelchair with arms in lap for 10-15 sec, exhibits increased left lateral lean this session after missing a week of PT 6/1: patient refused to attempt    Time 8    Period Weeks    Status Unable to assess    Target Date 01/10/22      PT LONG TERM GOAL #4   Title Pt will demonstrate ability to utilize BUEs to weight-shift to each side in WC and maintain weight shift for 30 seconds to assist in pressure relief and reduce risk of wound formation.    Baseline 10/6: pt currently unable, 11/8: unable to initiate weight shift, able to use UE but unable to shift hips without assistance; 1/18: requires max cues and min A for weight shift 4/12: able to use UE on arm rest and initiate weight shift but requires significant increased time, requires min A 6/1: patient refused to attempt    Time 8    Period Weeks    Status Unable to assess    Target Date 01/10/22      PT LONG TERM GOAL #5   Title Patient will  be able to transfer sit<>Stand with LRAD, min  A +1 to assist with repositioning and pressure relief as well as ADLs/dressing;    Baseline 4/12: requires mod A +2 6/1: patient refused to attempt this session    Time 8    Period Weeks    Status Unable to assess    Target Date 01/10/22               Plan -     Clinical Impression Statement Patient tolerated session fair. Patient does exhibit limited initiative with exercise. She was educated in importance of doing LE ROM/strengthening to facilitate increased strength in LE. Patient is limited with all mobility and depends on caregiver for all mobility. She was educated in importance of increased AROM for strengthening. Plan to see patient for 1 more month to see if progress can be achieved. If patient does not make progress consider discharge from PT. Patient would benefit from additional skilled PT intervention to improve strength, balance and  safety;    Personal Factors and Comorbidities Age;Time since onset of injury/illness/exacerbation;Comorbidity 3+;Past/Current Experience;Education;Social Background;Transportation;Sex;Fitness    Comorbidities diastolic congestive heart failure, diabetes mellitus, diabetic neuropathy, hypertension, hyperlipidemia, GERD, peripheral vascular disease, CVA. on 9/2 had amputation of L foot second toe MTP joint and LLE revascularization, recent hx of COVID19    Examination-Activity Limitations Bathing;Hygiene/Grooming;Squat;Bed Mobility;Stairs;Lift;Bend;Locomotion Level;Stand;Caring for [employer];Toileting;Carry;Self Feeding;Transfers;Continence;Dressing;Other;Sit    Examination-Participation Restrictions Cleaning;Shop;Community Activity;Meal Prep;Driving;Other    Stability/Clinical Decision Making Evolving/Moderate complexity    Rehab Potential Poor    PT Frequency 2x / week    PT Duration 8 weeks    PT Treatment/Interventions ADLs/Self Care Home Management;DME Instruction;Functional mobility  training;Therapeutic activities;Therapeutic exercise;Neuromuscular re-education;Patient/family education;Wheelchair mobility training;Passive range of motion;Energy conservation;Cryotherapy;Biofeedback;Electrical Stimulation;Iontophoresis 4mg /ml Dexamethasone;Moist Heat;Traction;Ultrasound;Stair training;Cognitive remediation;Balance training;Orthotic Fit/Training;Manual techniques;Manual lymph drainage;Splinting;Taping;Vasopneumatic Device;Vestibular;Visual/perceptual remediation/compensation;Gait training    PT Next Visit Plan Consider DC from PT andor transition to [place]?    PT Home Exercise Plan Previous- Seated lateral weight shift pressure relief technique in wheelchair to be performed 5 days/week for 2 sets of 10 repetitions each side.  PT provided patient with picture of technique as well.  05/21/2021- Access Code: [email or web address];Access Code: [email or web address]    Consulted and Agree with Plan of Care Family member/caregiver    Family Member Consulted Spouse                Rodnisha Blomgren, PT, DPT 02/18/2022, 2:23 PM

## 2022-02-20 ENCOUNTER — Ambulatory Visit: Payer: Medicare HMO | Admitting: Physical Therapy

## 2022-02-20 ENCOUNTER — Encounter: Payer: Self-pay | Admitting: Physical Therapy

## 2022-02-20 DIAGNOSIS — R278 Other lack of coordination: Secondary | ICD-10-CM

## 2022-02-20 DIAGNOSIS — M6281 Muscle weakness (generalized): Secondary | ICD-10-CM | POA: Diagnosis not present

## 2022-02-20 DIAGNOSIS — R269 Unspecified abnormalities of gait and mobility: Secondary | ICD-10-CM

## 2022-02-20 DIAGNOSIS — R2689 Other abnormalities of gait and mobility: Secondary | ICD-10-CM

## 2022-02-20 DIAGNOSIS — R262 Difficulty in walking, not elsewhere classified: Secondary | ICD-10-CM

## 2022-02-20 DIAGNOSIS — R2681 Unsteadiness on feet: Secondary | ICD-10-CM

## 2022-02-20 DIAGNOSIS — R293 Abnormal posture: Secondary | ICD-10-CM

## 2022-02-20 NOTE — Therapy (Signed)
OUTPATIENT PHYSICAL THERAPY TREATMENT NOTE   Patient Name: [name] MRN: [id number] DOB:1941/08/02, 81 y.o., female Today's Date: 02/20/2022  PCP: Beverlyn Roux MD REFERRING PROVIDER: Beverlyn Roux MD   PT End of Session - 02/20/22 1155     Visit Number 54    Number of Visits 57    Date for PT Re-Evaluation 03/11/22    Authorization Type [employer]; Medicaid    Authorization Time Period 11/05/21-02/01/22    PT Start Time 1148    PT Stop Time 1230    PT Time Calculation (min) 42 min    Activity Tolerance Patient limited by fatigue;Patient limited by pain    Behavior During Therapy Impulsive;Flat affect;Restless             Past Medical History:  Diagnosis Date   Acute heart failure (HCC)    Aortic stenosis    CHF (congestive heart failure) (HCC)    Complication of anesthesia    Hard to wake patient up after having anesthesia   Diabetes mellitus without complication ([place])    Diabetic neuropathy ([place])    Takes Lyrica   Edema of both legs    Takes Lasix   GERD (gastroesophageal reflux disease)    High cholesterol    HTN (hypertension)    Hypertension    PAD (peripheral artery disease) (HCC)    Shortness of breath dyspnea    Spasm of back muscles    Stroke (HCC)    Wound, open    Right great toe   Past Surgical History:  Procedure Laterality Date   AMPUTATION TOE Left 06/09/2019   Procedure: Left third toe and partial great toe amputation and debridement;  Surgeon: Katha Cabal, MD;  Location: [facility];  Service: Vascular;  Laterality: Left;   AMPUTATION TOE Left 04/06/2020   Procedure: AMPUTATION LEFT SECOND TOE;  Surgeon: Samara Deist, DPM;  Location: [facility];  Service: Podiatry;  Laterality: Left;   BYPASS GRAFT POPLITEAL TO TIBIAL Right 02/28/2016   Procedure: BYPASS GRAFT RIGHT BELOW KNEE POPLITEAL TO PERONEAL USING REVERSED RIGHT GREATER SAPHENOUS VEIN;  Surgeon: Conrad [place], MD;  Location: [place];  Service: Vascular;  Laterality: Right;   CYST  EXCISION     Abdomen   EYE SURGERY Bilateral    Cataract removal   INTRAMEDULLARY (IM) NAIL INTERTROCHANTERIC Left 02/04/2014   Procedure: INTRAMEDULLARY (IM) NAIL INTERTROCHANTRIC FEMORAL;  Surgeon: Mauri Pole, MD;  Location: [place];  Service: Orthopedics;  Laterality: Left;   IR GENERIC HISTORICAL  03/01/2016   IR ANGIO INTRA EXTRACRAN SEL COM CAROTID INNOMINATE UNI R MOD SED 03/01/2016 [name], MD [facility]   IR GENERIC HISTORICAL  03/01/2016   IR ENDOVASC INTRACRANIAL INF OTHER THAN THROMBO ART INC DIAG ANGIO 03/01/2016 [name], MD [facility]   IR GENERIC HISTORICAL  03/01/2016   IR INTRAVSC STENT CERV CAROTID W/O EMB-PROT MOD SED INC ANGIO 03/01/2016 [name], MD [facility]   IR GENERIC HISTORICAL  06/03/2016   IR RADIOLOGIST EVAL & MGMT 06/03/2016 [facility]   LOWER EXTREMITY ANGIOGRAPHY Left 02/09/2019   Procedure: LOWER EXTREMITY ANGIOGRAPHY;  Surgeon: Katha Cabal, MD;  Location: [place] [facility];  Service: Cardiovascular;  Laterality: Left;   LOWER EXTREMITY ANGIOGRAPHY Left 03/10/2019   Procedure: LOWER EXTREMITY ANGIOGRAPHY;  Surgeon: Katha Cabal, MD;  Location: [place] [facility];  Service: Cardiovascular;  Laterality: Left;   LOWER EXTREMITY ANGIOGRAPHY Left 04/27/2019   Procedure: LOWER EXTREMITY ANGIOGRAPHY;  Surgeon:  Schnier, Dolores Lory, MD;  Location: [place] [facility];  Service: Cardiovascular;  Laterality: Left;   LOWER EXTREMITY ANGIOGRAPHY Left 06/08/2019   Procedure: Lower Extremity Angiography;  Surgeon: Katha Cabal, MD;  Location: [place] [facility];  Service: Cardiovascular;  Laterality: Left;   LOWER EXTREMITY ANGIOGRAPHY Left 04/03/2020   Procedure: Lower Extremity Angiography;  Surgeon: Algernon Huxley, MD;  Location: [place] [facility];  Service: Cardiovascular;  Laterality: Left;   LOWER EXTREMITY ANGIOGRAPHY Right 10/20/2020   Procedure: Lower Extremity Angiography;  Surgeon: Katha Cabal, MD;   Location: [place] [facility];  Service: Cardiovascular;  Laterality: Right;   LOWER EXTREMITY ANGIOGRAPHY Left 01/23/2021   Procedure: LOWER EXTREMITY ANGIOGRAPHY;  Surgeon: Katha Cabal, MD;  Location: [place] [facility];  Service: Cardiovascular;  Laterality: Left;   LOWER EXTREMITY ANGIOGRAPHY Right 04/10/2021   Procedure: LOWER EXTREMITY ANGIOGRAPHY;  Surgeon: Katha Cabal, MD;  Location: [place] [facility];  Service: Cardiovascular;  Laterality: Right;   ORIF TOE FRACTURE Right 02/08/2014   Procedure: OPEN REDUCTION INTERNAL FIXATION Right METATARSAL  FRACTURE ;  Surgeon: Wylene Simmer, MD;  Location: [place];  Service: Orthopedics;  Laterality: Right;   PERIPHERAL VASCULAR CATHETERIZATION N/A 12/28/2015   Procedure: Abdominal Aortogram w/Lower Extremity;  Surgeon: Conrad [place], MD;  Location: [place] [facility];  Service: Cardiovascular;  Laterality: N/A;   RADIOLOGY WITH ANESTHESIA N/A 03/01/2016   Procedure: RADIOLOGY WITH ANESTHESIA;  Surgeon: [name], MD;  Location: [place];  Service: Radiology;  Laterality: N/A;   VEIN HARVEST Right 02/28/2016   Procedure: RIGHT GREATER SAPHENOUS VEIN HARVEST;  Surgeon: Conrad Foster, MD;  Location: [place];  Service: Vascular;  Laterality: Right;   Patient Active Problem List   Diagnosis Date Noted   Atherosclerosis of native arteries of extremity with rest pain ([place]) 04/20/2021   COVID-19 virus infection 03/07/2021   Acute febrile illness 03/07/2021   Hypoalbuminemia 03/07/2021   Hyperglycemia due to diabetes mellitus ([place]) 03/07/2021   Elevated troponin 03/07/2021   Constipation 03/07/2021   COPD (chronic obstructive pulmonary disease) ([place]) 10/20/2020   GERD (gastroesophageal reflux disease) 10/20/2020   Physical deconditioning 10/20/2020   Cellulitis 10/19/2020   Necrotic toes (HCC)    Insulin dependent diabetes mellitus type IA ([place])    Diabetic neuropathy ([place])    Gangrene of toe of both feet ([place]) 06/06/2019   Peripheral  arterial disease ([place]) 04/27/2019   ESBL (extended spectrum beta-lactamase) producing bacteria infection 12/25/2018   PVD (peripheral vascular disease) ([place]) 09/12/2016   Ulcer of right lower extremity ([place]) 08/29/2016   Acute on chronic diastolic heart failure ([place]) 07/08/2016   HTN (hypertension) 07/05/2016   Sensorineural hearing loss (SNHL), 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   Chronic diastolic CHF (congestive heart failure) ([place]) 03/10/2016   HLD (hyperlipidemia) 03/09/2016   Acute respiratory acidosis ([place]) 03/05/2016   Anemia, iron deficiency 03/03/2016   Cerebrovascular accident (CVA) due to thrombosis of precerebral artery ([place]) 03/01/2016   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 ([place]) 03/01/2016   Atherosclerosis of native arteries of the extremities with gangrene ([place]) 12/08/2015   Anemia, chronic disease 04/27/2014   Unspecified hereditary and idiopathic peripheral neuropathy 03/14/2014   DM2 (diabetes mellitus, type 2) ([place]) 02/14/2014   Aortic stenosis 02/04/2014   Closed left subtrochanteric femur fracture ([place]) 02/03/2014   Hip fracture, left ([place]) 02/03/2014   Fibula fracture 02/03/2014   MTP instability 02/03/2014   Hip fracture ([place]) 02/03/2014  REFERRING DIAG: Deconditioning;   THERAPY DIAG:  Muscle weakness (generalized)  Other abnormalities of gait and mobility  Unsteadiness on feet  Abnormality of gait and mobility  Other lack of coordination  Abnormal posture  Difficulty in walking, not elsewhere classified  Rationale for Evaluation and Treatment Rehabilitation  PERTINENT HISTORY: 81yoF presenting to [facility] to address recent gradual progressive decline in mobility, family goals for improved strength, to promote > participation in transfers to wheelchair and safety with ADL performance. Recent illness includes admission to [facility] on 03/07/21-03/10/21 due to COVID infection c AMS and fever. Pt then underwent outpatient recanalization/limb  salvage of RLE 04/10/2021; LLE salvage performed in June 2022. Other PMH: CHF, DM, HTN, CVA, COPD, multiple remote toe amputations, stg 2 sacral ulcer. PLOF includes 3-4 years bed bound status, hoyerlifts to WC, totalA for ADL performance. Pt is cared for by family, as well as professional caregiver services.  PRECAUTIONS: Fall Risk  SUBJECTIVE: Caregiver reports things are going okay. Patient states, "My bottom hurts." She reports pain in her bottom and thinks she needs to move her bowels.   PAIN:  Are you having pain? Yes, Patient unable to rate, reports severe pain in bottom and BLE legs     TODAY'S TREATMENT:  Pt transferred wheelchair to mat table with hoyer lift, dependent transfer;  Patient supine on mat table with wedge and 1 pillows;  Patient educated in rolling to help with lower body dressing Pt min A +1 for rolling side/side x2 reps each direction She required cues for bending knee and pulling self over with UE. She showed increased initiation of reaching with UE but has difficulty bending LE to help roll lower body;    Instructed patient in LE exercise: SLR hip flexion- pt able to initiate hip flexion ROM but unable to lift leg off table She completed x10 reps with AAROM for increased ROM; She was able to initiate increased ROM this session compared to last session;   SAQ with bolster under LE 2# x15 reps each LE- patient reports moderate difficulty and required frequent cues including visual cues to increase ROM;  Instructed patient in heel slides with patient initiating ROM but therapist providing overpressure for increased knee flexion ROM x10 reps each LE;   Instructed patient in bridge x10 reps, partial ROM- unable to fully lift hip off table; Pt/careful educated in importance of hip extension to work on increasing ROM to allow patient to participate in lower body dressing- lifting hips for pulling pants up hips;   She was dependent to transfer hoyer from mat table  to wheelchair +2 for positioning; Patient continues to complain of buttocks pain with sitting;She does have pad in wheelchair but is still uncomfortable.   PATIENT EDUCATION: Education details: plan of care/exercise/positioning;  Person educated: Patient and Spouse Education method: Explanation, Demonstration, and Verbal cues Education comprehension: verbalized understanding, returned demonstration, verbal cues required, and needs further education   HOME EXERCISE PROGRAM: Reinforced importance of letting patient do active LE ROM/strengthening at home.  Continue as previously given;    PT Short Term Goals - 01/03/22 1348       PT SHORT TERM GOAL #1   Title Patient will be independent in home exercise program to improve strength/mobility for better functional independence with ADLs.    Baseline 10/6: to be initiated, 11/8: caregiver reports she is doing them 3x a day; 1/18: doesn't do much exercise; 10/01/2021= Husband reports she is doing better with her HEP and does better exercising  her her son. He states no questions at this time. 4/12: husband and family have been helping with HEP, doing some daily; 6/1: husband and son helping    Time 4    Period Weeks    Status Partially Met    Target Date 11/12/21              PT Long Term Goals - 01/03/22 1349       PT LONG TERM GOAL #1   Title Patient will increase FOTO score to equal to or greater than 44 to demonstrate statistically significant improvement in mobility and quality of life.    Baseline 10/6: 26, 11/8: 12%, 1/18: 12%, 4/12: deferred; 6/1: 12%    Time 8    Period Weeks    Status Not Met    Target Date 01/10/22      PT LONG TERM GOAL #2   Title Patient will roll L and R in bed with mod A for decreased caregiver burden for ADLs and toileting.    Baseline 10/6: per pt spouse, pt currently dependent, 11/8: mod A, inconsistent, 1/18: needs min A; 10/01/2021=Min//mod assist to right and back to left- Max VC for correct  technique. Patient required physical assist with rolling especially moving her legs- she was able to reach and roll her upper torso well with min assist. 4/12: inconsistent max to mod A, able to initiate with UE, requires assistance for LE positioning 6/1: per husband continues to require assistance    Time 8    Period Weeks    Status Partially Met    Target Date 01/10/22      PT LONG TERM GOAL #3   Title Patient will sit upright in WC, with BUE support and without leaning on backrest for 10 minutes for ADL performance and pressure relief.    Baseline 10/6: pt exhibits core contraction but unable to sit upright, 11/8: able to sit upright for <20 sec with mod VCs for erect positioning, 1/18: able to hold for 60 sec with mod VCs. 10/01/2021=Patient was then assessed sitting at [facility]- without back support- unable to maintain > 30 sec without assistance. 4/12: able to sit forward in wheelchair with arms in lap for 10-15 sec, exhibits increased left lateral lean this session after missing a week of PT 6/1: patient refused to attempt    Time 8    Period Weeks    Status Unable to assess    Target Date 01/10/22      PT LONG TERM GOAL #4   Title Pt will demonstrate ability to utilize BUEs to weight-shift to each side in WC and maintain weight shift for 30 seconds to assist in pressure relief and reduce risk of wound formation.    Baseline 10/6: pt currently unable, 11/8: unable to initiate weight shift, able to use UE but unable to shift hips without assistance; 1/18: requires max cues and min A for weight shift 4/12: able to use UE on arm rest and initiate weight shift but requires significant increased time, requires min A 6/1: patient refused to attempt    Time 8    Period Weeks    Status Unable to assess    Target Date 01/10/22      PT LONG TERM GOAL #5   Title Patient will be able to transfer sit<>Stand with LRAD, min A +1 to assist with repositioning and pressure relief as well as ADLs/dressing;     Baseline 4/12: requires mod A +2 6/1: patient refused  to attempt this session    Time 8    Period Weeks    Status Unable to assess    Target Date 01/10/22               Plan -     Clinical Impression Statement Patient tolerated session fair. Patient does exhibit limited initiative with exercise. She was educated in importance of doing LE ROM/strengthening to facilitate increased strength in LE. Patient is limited with all mobility and depends on caregiver for all mobility. She was educated in importance of increased AROM for strengthening. Caregiver educated in ways to facilitate patient increasing activity at home- Ex: to ask patient to lift her leg when donning socks rather than just lifting patient's leg for her. Caregiver verbalized understanding.  Patient would benefit from additional skilled PT intervention to improve strength, balance and  mobility;    Personal Factors and Comorbidities Age;Time since onset of injury/illness/exacerbation;Comorbidity 3+;Past/Current Experience;Education;Social Background;Transportation;Sex;Fitness    Comorbidities diastolic congestive heart failure, diabetes mellitus, diabetic neuropathy, hypertension, hyperlipidemia, GERD, peripheral vascular disease, CVA. on 9/2 had amputation of L foot second toe MTP joint and LLE revascularization, recent hx of COVID19    Examination-Activity Limitations Bathing;Hygiene/Grooming;Squat;Bed Mobility;Stairs;Lift;Bend;Locomotion Level;Stand;Caring for [employer];Toileting;Carry;Self Feeding;Transfers;Continence;Dressing;Other;Sit    Examination-Participation Restrictions Cleaning;Shop;Community Activity;Meal Prep;Driving;Other    Stability/Clinical Decision Making Evolving/Moderate complexity    Rehab Potential Poor    PT Frequency 2x / week    PT Duration 8 weeks    PT Treatment/Interventions ADLs/Self Care Home Management;DME Instruction;Functional mobility training;Therapeutic activities;Therapeutic  exercise;Neuromuscular re-education;Patient/family education;Wheelchair mobility training;Passive range of motion;Energy conservation;Cryotherapy;Biofeedback;Electrical Stimulation;Iontophoresis 61m/ml Dexamethasone;Moist Heat;Traction;Ultrasound;Stair training;Cognitive remediation;Balance training;Orthotic Fit/Training;Manual techniques;Manual lymph drainage;Splinting;Taping;Vasopneumatic Device;Vestibular;Visual/perceptual remediation/compensation;Gait training    PT Next Visit Plan Consider DC from PT andor transition to [facility]    PT Home Exercise Plan Previous- Seated lateral weight shift pressure relief technique in wheelchair to be performed 5 days/week for 2 sets of 10 repetitions each side.  PT provided patient with picture of technique as well.  05/21/2021- Access Code: [email or web address];Access Code: [email or web address]   Consulted and Agree with Plan of Care Family member/caregiver    Family Member Consulted Spouse                Symon Norwood, PT, DPT 02/20/2022, 12:35 PM

## 2022-02-25 ENCOUNTER — Ambulatory Visit: Payer: Medicare HMO

## 2022-02-25 DIAGNOSIS — M6281 Muscle weakness (generalized): Secondary | ICD-10-CM | POA: Diagnosis not present

## 2022-02-25 DIAGNOSIS — R2681 Unsteadiness on feet: Secondary | ICD-10-CM

## 2022-02-25 NOTE — Therapy (Signed)
OUTPATIENT PHYSICAL THERAPY TREATMENT NOTE   Patient Name: [name] MRN: [id number] DOB:[birth year], 81 y.o., female Today's Date: 02/25/2022  PCP: Beverlyn Roux MD REFERRING PROVIDER: Beverlyn Roux MD   PT End of Session - 02/25/22 1403     Visit Number 55    Number of Visits 79    Date for PT Re-Evaluation 03/11/22    Authorization Type [employer]; Medicaid    Authorization Time Period 11/05/21-02/01/22    PT Start Time 1156    PT Stop Time 1230    PT Time Calculation (min) 34 min    Equipment Utilized During Treatment Gait belt    Activity Tolerance Patient limited by fatigue;Patient limited by pain;Patient tolerated treatment well    Behavior During Therapy Impulsive;Flat affect;Restless              Past Medical History:  Diagnosis Date   Acute heart failure (HCC)    Aortic stenosis    CHF (congestive heart failure) (HCC)    Complication of anesthesia    Hard to wake patient up after having anesthesia   Diabetes mellitus without complication ([place])    Diabetic neuropathy ([place])    Takes Lyrica   Edema of both legs    Takes Lasix   GERD (gastroesophageal reflux disease)    High cholesterol    HTN (hypertension)    Hypertension    PAD (peripheral artery disease) (HCC)    Shortness of breath dyspnea    Spasm of back muscles    Stroke (HCC)    Wound, open    Right great toe   Past Surgical History:  Procedure Laterality Date   AMPUTATION TOE Left 06/09/2019   Procedure: Left third toe and partial great toe amputation and debridement;  Surgeon: Katha Cabal, MD;  Location: [facility];  Service: Vascular;  Laterality: Left;   AMPUTATION TOE Left 04/06/2020   Procedure: AMPUTATION LEFT SECOND TOE;  Surgeon: Samara Deist, DPM;  Location: [facility];  Service: Podiatry;  Laterality: Left;   BYPASS GRAFT POPLITEAL TO TIBIAL Right 02/28/2016   Procedure: BYPASS GRAFT RIGHT BELOW KNEE POPLITEAL TO PERONEAL USING REVERSED RIGHT GREATER SAPHENOUS VEIN;  Surgeon:  Conrad Nikolai, MD;  Location: [place];  Service: Vascular;  Laterality: Right;   CYST EXCISION     Abdomen   EYE SURGERY Bilateral    Cataract removal   INTRAMEDULLARY (IM) NAIL INTERTROCHANTERIC Left 02/04/2014   Procedure: INTRAMEDULLARY (IM) NAIL INTERTROCHANTRIC FEMORAL;  Surgeon: Mauri Pole, MD;  Location: [place];  Service: Orthopedics;  Laterality: Left;   IR GENERIC HISTORICAL  03/01/2016   IR ANGIO INTRA EXTRACRAN SEL COM CAROTID INNOMINATE UNI R MOD SED 03/01/2016 [name], MD [facility]   IR GENERIC HISTORICAL  03/01/2016   IR ENDOVASC INTRACRANIAL INF OTHER THAN THROMBO ART INC DIAG ANGIO 03/01/2016 [name], MD [facility]   IR GENERIC HISTORICAL  03/01/2016   IR INTRAVSC STENT CERV CAROTID W/O EMB-PROT MOD SED INC ANGIO 03/01/2016 [name], MD [facility]   IR GENERIC HISTORICAL  06/03/2016   IR RADIOLOGIST EVAL & MGMT 06/03/2016 [facility]   LOWER EXTREMITY ANGIOGRAPHY Left 02/09/2019   Procedure: LOWER EXTREMITY ANGIOGRAPHY;  Surgeon: Katha Cabal, MD;  Location: [place] [facility];  Service: Cardiovascular;  Laterality: Left;   LOWER EXTREMITY ANGIOGRAPHY Left 03/10/2019   Procedure: LOWER EXTREMITY ANGIOGRAPHY;  Surgeon: Katha Cabal, MD;  Location: [place] [facility];  Service: Cardiovascular;  Laterality: Left;  LOWER EXTREMITY ANGIOGRAPHY Left 04/27/2019   Procedure: LOWER EXTREMITY ANGIOGRAPHY;  Surgeon: Katha Cabal, MD;  Location: [place] [facility];  Service: Cardiovascular;  Laterality: Left;   LOWER EXTREMITY ANGIOGRAPHY Left 06/08/2019   Procedure: Lower Extremity Angiography;  Surgeon: Katha Cabal, MD;  Location: [place] [facility];  Service: Cardiovascular;  Laterality: Left;   LOWER EXTREMITY ANGIOGRAPHY Left 04/03/2020   Procedure: Lower Extremity Angiography;  Surgeon: Algernon Huxley, MD;  Location: [place] [facility];  Service: Cardiovascular;  Laterality: Left;   LOWER EXTREMITY ANGIOGRAPHY Right  10/20/2020   Procedure: Lower Extremity Angiography;  Surgeon: Katha Cabal, MD;  Location: [place] [facility];  Service: Cardiovascular;  Laterality: Right;   LOWER EXTREMITY ANGIOGRAPHY Left 01/23/2021   Procedure: LOWER EXTREMITY ANGIOGRAPHY;  Surgeon: Katha Cabal, MD;  Location: [place] [facility];  Service: Cardiovascular;  Laterality: Left;   LOWER EXTREMITY ANGIOGRAPHY Right 04/10/2021   Procedure: LOWER EXTREMITY ANGIOGRAPHY;  Surgeon: Katha Cabal, MD;  Location: [place] [facility];  Service: Cardiovascular;  Laterality: Right;   ORIF TOE FRACTURE Right 02/08/2014   Procedure: OPEN REDUCTION INTERNAL FIXATION Right METATARSAL  FRACTURE ;  Surgeon: Wylene Simmer, MD;  Location: [place];  Service: Orthopedics;  Laterality: Right;   PERIPHERAL VASCULAR CATHETERIZATION N/A 12/28/2015   Procedure: Abdominal Aortogram w/Lower Extremity;  Surgeon: Conrad Matewan, MD;  Location: [place] [facility];  Service: Cardiovascular;  Laterality: N/A;   RADIOLOGY WITH ANESTHESIA N/A 03/01/2016   Procedure: RADIOLOGY WITH ANESTHESIA;  Surgeon: [name], MD;  Location: [place];  Service: Radiology;  Laterality: N/A;   VEIN HARVEST Right 02/28/2016   Procedure: RIGHT GREATER SAPHENOUS VEIN HARVEST;  Surgeon: Conrad [place], MD;  Location: [place];  Service: Vascular;  Laterality: Right;   Patient Active Problem List   Diagnosis Date Noted   Atherosclerosis of native arteries of extremity with rest pain ([place]) 04/20/2021   COVID-19 virus infection 03/07/2021   Acute febrile illness 03/07/2021   Hypoalbuminemia 03/07/2021   Hyperglycemia due to diabetes mellitus ([place]) 03/07/2021   Elevated troponin 03/07/2021   Constipation 03/07/2021   COPD (chronic obstructive pulmonary disease) ([place]) 10/20/2020   GERD (gastroesophageal reflux disease) 10/20/2020   Physical deconditioning 10/20/2020   Cellulitis 10/19/2020   Necrotic toes (HCC)    Insulin dependent diabetes mellitus type IA ([place])     Diabetic neuropathy ([place])    Gangrene of toe of both feet ([place]) 06/06/2019   Peripheral arterial disease ([place]) 04/27/2019   ESBL (extended spectrum beta-lactamase) producing bacteria infection 12/25/2018   PVD (peripheral vascular disease) ([place]) 09/12/2016   Ulcer of right lower extremity ([place]) 08/29/2016   Acute on chronic diastolic heart failure ([place]) 07/08/2016   HTN (hypertension) 07/05/2016   Sensorineural hearing loss (SNHL), 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   Chronic diastolic CHF (congestive heart failure) ([place]) 03/10/2016   HLD (hyperlipidemia) 03/09/2016   Acute respiratory acidosis ([place]) 03/05/2016   Anemia, iron deficiency 03/03/2016   Cerebrovascular accident (CVA) due to thrombosis of precerebral artery ([place]) 03/01/2016   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 ([place]) 03/01/2016   Atherosclerosis of native arteries of the extremities with gangrene ([place]) 12/08/2015   Anemia, chronic disease 04/27/2014   Unspecified hereditary and idiopathic peripheral neuropathy 03/14/2014   DM2 (diabetes mellitus, type 2) ([place]) 02/14/2014   Aortic stenosis 02/04/2014   Closed left subtrochanteric femur fracture ([place]) 02/03/2014   Hip fracture, left ([place]) 02/03/2014   Fibula fracture 02/03/2014  MTP instability 02/03/2014   Hip fracture ([place]) 02/03/2014    REFERRING DIAG: Deconditioning;   THERAPY DIAG:  Muscle weakness (generalized)  Unsteadiness on feet  Rationale for Evaluation and Treatment Rehabilitation  PERTINENT HISTORY: 81yoF presenting to [facility] to address recent gradual progressive decline in mobility, family goals for improved strength, to promote > participation in transfers to wheelchair and safety with ADL performance. Recent illness includes admission to [facility] on 03/07/21-03/10/21 due to COVID infection c AMS and fever. Pt then underwent outpatient recanalization/limb salvage of RLE 04/10/2021; LLE salvage performed in June 2022. Other PMH: CHF, DM, HTN, CVA,  COPD, multiple remote toe amputations, stg 2 sacral ulcer. PLOF includes 3-4 years bed bound status, hoyerlifts to WC, totalA for ADL performance. Pt is cared for by family, as well as professional caregiver services.  PRECAUTIONS: Fall Risk  SUBJECTIVE: Pt reports pain in B feet and in her bottom (chronic). Pt spouse reports she has some "spots" when asked about sores on bottom but reports no wounds/sores currently.  Pt spouse reports they've been working on rolling at home for pressure relief, reports "it is work" but states, "it is working real good."  No other current concerns reported.   PAIN:  Are you having pain? Yes, pt does not rate. In BLE and bottom.      TODAY'S TREATMENT:   02/25/2022  Discussed pressure relief techniques in the chair and when she's in her bed to prevent wound development. Had pt recall information in session and purpose of pressure relief techniques successfully. Spouse verbalized understanding.   Chair crunches pressure relief technique (forward fleixon), assisted 10x. Lots of encouragement provided throughout from PT for pt to complete activity.  Crunches to reach ball x multiple reps - exhibits improved mm activation/pressure relief technique  Lateral pressure relief technique in chair x multiple reps each direction, frequent encouragement/TC to complete activity  LAQ 2x15 with 2lb AW with use of TC/external cue to promote ROM. Rates medium, reports is fatiguing  Seated marches 15x each LE, requires assist from PT to continue task and improve ROM   PATIENT EDUCATION: Education details: Pt educated throughout session about proper posture and technique with exercises. Improved exercise technique, movement at target joints, use of target muscles after min to mod verbal, visual, tactile cues. Pressure relief techniques and their purpose  Person educated: Patient and Spouse Education method: Explanation, Demonstration, and Verbal cues Education  comprehension: verbalized understanding, returned demonstration, verbal cues required, and needs further education   HOME EXERCISE PROGRAM:   Reinforced use of pressure relief techniques, importance of HEP on 02/25/2022 Previous: Reinforced importance of letting patient do active LE ROM/strengthening at home.  Continue as previously given;    PT Short Term Goals - 01/03/22 1348       PT SHORT TERM GOAL #1   Title Patient will be independent in home exercise program to improve strength/mobility for better functional independence with ADLs.    Baseline 10/6: to be initiated, 11/8: caregiver reports she is doing them 3x a day; 1/18: doesn't do much exercise; 10/01/2021= Husband reports she is doing better with her HEP and does better exercising her her son. He states no questions at this time. 4/12: husband and family have been helping with HEP, doing some daily; 6/1: husband and son helping    Time 4    Period Weeks    Status Partially Met    Target Date 11/12/21  PT Long Term Goals - 01/03/22 1349       PT LONG TERM GOAL #1   Title Patient will increase FOTO score to equal to or greater than 44 to demonstrate statistically significant improvement in mobility and quality of life.    Baseline 10/6: 26, 11/8: 12%, 1/18: 12%, 4/12: deferred; 6/1: 12%    Time 8    Period Weeks    Status Not Met    Target Date 01/10/22      PT LONG TERM GOAL #2   Title Patient will roll L and R in bed with mod A for decreased caregiver burden for ADLs and toileting.    Baseline 10/6: per pt spouse, pt currently dependent, 11/8: mod A, inconsistent, 1/18: needs min A; 10/01/2021=Min//mod assist to right and back to left- Max VC for correct technique. Patient required physical assist with rolling especially moving her legs- she was able to reach and roll her upper torso well with min assist. 4/12: inconsistent max to mod A, able to initiate with UE, requires assistance for LE positioning 6/1:  per husband continues to require assistance    Time 8    Period Weeks    Status Partially Met    Target Date 01/10/22      PT LONG TERM GOAL #3   Title Patient will sit upright in WC, with BUE support and without leaning on backrest for 10 minutes for ADL performance and pressure relief.    Baseline 10/6: pt exhibits core contraction but unable to sit upright, 11/8: able to sit upright for <20 sec with mod VCs for erect positioning, 1/18: able to hold for 60 sec with mod VCs. 10/01/2021=Patient was then assessed sitting at [facility]- without back support- unable to maintain > 30 sec without assistance. 4/12: able to sit forward in wheelchair with arms in lap for 10-15 sec, exhibits increased left lateral lean this session after missing a week of PT 6/1: patient refused to attempt    Time 8    Period Weeks    Status Unable to assess    Target Date 01/10/22      PT LONG TERM GOAL #4   Title Pt will demonstrate ability to utilize BUEs to weight-shift to each side in WC and maintain weight shift for 30 seconds to assist in pressure relief and reduce risk of wound formation.    Baseline 10/6: pt currently unable, 11/8: unable to initiate weight shift, able to use UE but unable to shift hips without assistance; 1/18: requires max cues and min A for weight shift 4/12: able to use UE on arm rest and initiate weight shift but requires significant increased time, requires min A 6/1: patient refused to attempt    Time 8    Period Weeks    Status Unable to assess    Target Date 01/10/22      PT LONG TERM GOAL #5   Title Patient will be able to transfer sit<>Stand with LRAD, min A +1 to assist with repositioning and pressure relief as well as ADLs/dressing;    Baseline 4/12: requires mod A +2 6/1: patient refused to attempt this session    Time 8    Period Weeks    Status Unable to assess    Target Date 01/10/22               Plan -     Clinical Impression Statement Session limited secondary to pt  late arrival. Patient tolerated appointment fair. Continues  to require frequent encouragement to engage in and complete exercises. PT reinforced importance of pressure relief techniques when pt in bed or chair to reduce risk of wound development. Pt and spouse both verbalized understanding. Pt able to "teach back" techniques in chair and instructions. However, likely requires further reinforcement for carryover. Patient would benefit from additional skilled PT intervention to improve strength, balance and  mobility.    Personal Factors and Comorbidities Age;Time since onset of injury/illness/exacerbation;Comorbidity 3+;Past/Current Experience;Education;Social Background;Transportation;Sex;Fitness    Comorbidities diastolic congestive heart failure, diabetes mellitus, diabetic neuropathy, hypertension, hyperlipidemia, GERD, peripheral vascular disease, CVA. on 9/2 had amputation of L foot second toe MTP joint and LLE revascularization, recent hx of COVID19    Examination-Activity Limitations Bathing;Hygiene/Grooming;Squat;Bed Mobility;Stairs;Lift;Bend;Locomotion Level;Stand;Caring for [employer];Toileting;Carry;Self Feeding;Transfers;Continence;Dressing;Other;Sit    Examination-Participation Restrictions Cleaning;Shop;Community Activity;Meal Prep;Driving;Other    Stability/Clinical Decision Making Evolving/Moderate complexity    Rehab Potential Poor    PT Frequency 2x / week    PT Duration 8 weeks    PT Treatment/Interventions ADLs/Self Care Home Management;DME Instruction;Functional mobility training;Therapeutic activities;Therapeutic exercise;Neuromuscular re-education;Patient/family education;Wheelchair mobility training;Passive range of motion;Energy conservation;Cryotherapy;Biofeedback;Electrical Stimulation;Iontophoresis 28m/ml Dexamethasone;Moist Heat;Traction;Ultrasound;Stair training;Cognitive remediation;Balance training;Orthotic Fit/Training;Manual techniques;Manual lymph  drainage;Splinting;Taping;Vasopneumatic Device;Vestibular;Visual/perceptual remediation/compensation;Gait training    PT Next Visit Plan [place]    PT Home Exercise Plan Previous- Seated lateral weight shift pressure relief technique in wheelchair to be performed 5 days/week for 2 sets of 10 repetitions each side.  PT provided patient with picture of technique as well.  05/21/2021- Access Code:  [email or web address];Access Code: [email or web address] Continue plan    Consulted and Agree with Plan of Care FGreater Binghamton Health Centermember/caregiver    Family Member [place] DPT 02/25/2022, 2:12 PM

## 2022-02-27 ENCOUNTER — Ambulatory Visit: Payer: Medicare HMO | Admitting: Physical Therapy

## 2022-03-04 ENCOUNTER — Ambulatory Visit: Payer: Medicare HMO | Admitting: Physical Therapy

## 2022-03-04 ENCOUNTER — Encounter: Payer: Self-pay | Admitting: Physical Therapy

## 2022-03-04 DIAGNOSIS — R2681 Unsteadiness on feet: Secondary | ICD-10-CM

## 2022-03-04 DIAGNOSIS — R293 Abnormal posture: Secondary | ICD-10-CM

## 2022-03-04 DIAGNOSIS — M6281 Muscle weakness (generalized): Secondary | ICD-10-CM

## 2022-03-04 DIAGNOSIS — R262 Difficulty in walking, not elsewhere classified: Secondary | ICD-10-CM

## 2022-03-04 DIAGNOSIS — R2689 Other abnormalities of gait and mobility: Secondary | ICD-10-CM

## 2022-03-04 DIAGNOSIS — R269 Unspecified abnormalities of gait and mobility: Secondary | ICD-10-CM

## 2022-03-04 DIAGNOSIS — R278 Other lack of coordination: Secondary | ICD-10-CM

## 2022-03-04 NOTE — Therapy (Signed)
OUTPATIENT PHYSICAL THERAPY TREATMENT NOTE   Patient Name: [name] MRN: [id number] DOB:Jan 18, 1941, 81 y.o., female Today's Date: 03/04/2022  PCP: Beverlyn Roux MD REFERRING PROVIDER: Beverlyn Roux MD   PT End of Session - 03/04/22 1008     Visit Number 56    Number of Visits 76    Date for PT Re-Evaluation 03/11/22    Authorization Type [employer]; Medicaid    Authorization Time Period 11/05/21-02/01/22    PT Start Time 1014    PT Stop Time 1100    PT Time Calculation (min) 46 min    Equipment Utilized During Treatment Gait belt    Activity Tolerance Patient limited by fatigue;Patient limited by pain;Patient tolerated treatment well    Behavior During Therapy Impulsive;Flat affect;Restless             Past Medical History:  Diagnosis Date   Acute heart failure (HCC)    Aortic stenosis    CHF (congestive heart failure) (HCC)    Complication of anesthesia    Hard to wake patient up after having anesthesia   Diabetes mellitus without complication ([place])    Diabetic neuropathy ([place])    Takes Lyrica   Edema of both legs    Takes Lasix   GERD (gastroesophageal reflux disease)    High cholesterol    HTN (hypertension)    Hypertension    PAD (peripheral artery disease) (HCC)    Shortness of breath dyspnea    Spasm of back muscles    Stroke (HCC)    Wound, open    Right great toe   Past Surgical History:  Procedure Laterality Date   AMPUTATION TOE Left 06/09/2019   Procedure: Left third toe and partial great toe amputation and debridement;  Surgeon: Katha Cabal, MD;  Location: [facility];  Service: Vascular;  Laterality: Left;   AMPUTATION TOE Left 04/06/2020   Procedure: AMPUTATION LEFT SECOND TOE;  Surgeon: Samara Deist, DPM;  Location: [facility];  Service: Podiatry;  Laterality: Left;   BYPASS GRAFT POPLITEAL TO TIBIAL Right 02/28/2016   Procedure: BYPASS GRAFT RIGHT BELOW KNEE POPLITEAL TO PERONEAL USING REVERSED RIGHT GREATER SAPHENOUS VEIN;  Surgeon:  Conrad Gilbert, MD;  Location: [place];  Service: Vascular;  Laterality: Right;   CYST EXCISION     Abdomen   EYE SURGERY Bilateral    Cataract removal   INTRAMEDULLARY (IM) NAIL INTERTROCHANTERIC Left 02/04/2014   Procedure: INTRAMEDULLARY (IM) NAIL INTERTROCHANTRIC FEMORAL;  Surgeon: Mauri Pole, MD;  Location: [place];  Service: Orthopedics;  Laterality: Left;   IR GENERIC HISTORICAL  03/01/2016   IR ANGIO INTRA EXTRACRAN SEL COM CAROTID INNOMINATE UNI R MOD SED 03/01/2016 [name], MD [facility]   IR GENERIC HISTORICAL  03/01/2016   IR ENDOVASC INTRACRANIAL INF OTHER THAN THROMBO ART INC DIAG ANGIO 03/01/2016 [name], MD [facility]   IR GENERIC HISTORICAL  03/01/2016   IR INTRAVSC STENT CERV CAROTID W/O EMB-PROT MOD SED INC ANGIO 03/01/2016 [name], MD [facility]   IR GENERIC HISTORICAL  06/03/2016   IR RADIOLOGIST EVAL & MGMT 06/03/2016 [facility]   LOWER EXTREMITY ANGIOGRAPHY Left 02/09/2019   Procedure: LOWER EXTREMITY ANGIOGRAPHY;  Surgeon: Katha Cabal, MD;  Location: [place] [facility];  Service: Cardiovascular;  Laterality: Left;   LOWER EXTREMITY ANGIOGRAPHY Left 03/10/2019   Procedure: LOWER EXTREMITY ANGIOGRAPHY;  Surgeon: Katha Cabal, MD;  Location: [place] [facility];  Service: Cardiovascular;  Laterality: Left;   LOWER  EXTREMITY ANGIOGRAPHY Left 04/27/2019   Procedure: LOWER EXTREMITY ANGIOGRAPHY;  Surgeon: Katha Cabal, MD;  Location: [place] [facility];  Service: Cardiovascular;  Laterality: Left;   LOWER EXTREMITY ANGIOGRAPHY Left 06/08/2019   Procedure: Lower Extremity Angiography;  Surgeon: Katha Cabal, MD;  Location: [place] [facility];  Service: Cardiovascular;  Laterality: Left;   LOWER EXTREMITY ANGIOGRAPHY Left 04/03/2020   Procedure: Lower Extremity Angiography;  Surgeon: Algernon Huxley, MD;  Location: [place] [facility];  Service: Cardiovascular;  Laterality: Left;   LOWER EXTREMITY ANGIOGRAPHY Right  10/20/2020   Procedure: Lower Extremity Angiography;  Surgeon: Katha Cabal, MD;  Location: [place] [facility];  Service: Cardiovascular;  Laterality: Right;   LOWER EXTREMITY ANGIOGRAPHY Left 01/23/2021   Procedure: LOWER EXTREMITY ANGIOGRAPHY;  Surgeon: Katha Cabal, MD;  Location: [place] [facility];  Service: Cardiovascular;  Laterality: Left;   LOWER EXTREMITY ANGIOGRAPHY Right 04/10/2021   Procedure: LOWER EXTREMITY ANGIOGRAPHY;  Surgeon: Katha Cabal, MD;  Location: [place] [facility];  Service: Cardiovascular;  Laterality: Right;   ORIF TOE FRACTURE Right 02/08/2014   Procedure: OPEN REDUCTION INTERNAL FIXATION Right METATARSAL  FRACTURE ;  Surgeon: Wylene Simmer, MD;  Location: [place];  Service: Orthopedics;  Laterality: Right;   PERIPHERAL VASCULAR CATHETERIZATION N/A 12/28/2015   Procedure: Abdominal Aortogram w/Lower Extremity;  Surgeon: Conrad Miesville, MD;  Location: [place] [facility];  Service: Cardiovascular;  Laterality: N/A;   RADIOLOGY WITH ANESTHESIA N/A 03/01/2016   Procedure: RADIOLOGY WITH ANESTHESIA;  Surgeon: [name], MD;  Location: [place];  Service: Radiology;  Laterality: N/A;   VEIN HARVEST Right 02/28/2016   Procedure: RIGHT GREATER SAPHENOUS VEIN HARVEST;  Surgeon: Conrad Brevard, MD;  Location: [place];  Service: Vascular;  Laterality: Right;   Patient Active Problem List   Diagnosis Date Noted   Atherosclerosis of native arteries of extremity with rest pain ([place]) 04/20/2021   COVID-19 virus infection 03/07/2021   Acute febrile illness 03/07/2021   Hypoalbuminemia 03/07/2021   Hyperglycemia due to diabetes mellitus ([place]) 03/07/2021   Elevated troponin 03/07/2021   Constipation 03/07/2021   COPD (chronic obstructive pulmonary disease) ([place]) 10/20/2020   GERD (gastroesophageal reflux disease) 10/20/2020   Physical deconditioning 10/20/2020   Cellulitis 10/19/2020   Necrotic toes (HCC)    Insulin dependent diabetes mellitus type IA ([place])     Diabetic neuropathy ([place])    Gangrene of toe of both feet ([place]) 06/06/2019   Peripheral arterial disease ([place]) 04/27/2019   ESBL (extended spectrum beta-lactamase) producing bacteria infection 12/25/2018   PVD (peripheral vascular disease) ([place]) 09/12/2016   Ulcer of right lower extremity ([place]) 08/29/2016   Acute on chronic diastolic heart failure ([place]) 07/08/2016   HTN (hypertension) 07/05/2016   Sensorineural hearing loss (SNHL), 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   Chronic diastolic CHF (congestive heart failure) ([place]) 03/10/2016   HLD (hyperlipidemia) 03/09/2016   Acute respiratory acidosis ([place]) 03/05/2016   Anemia, iron deficiency 03/03/2016   Cerebrovascular accident (CVA) due to thrombosis of precerebral artery ([place]) 03/01/2016   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 ([place]) 03/01/2016   Atherosclerosis of native arteries of the extremities with gangrene ([place]) 12/08/2015   Anemia, chronic disease 04/27/2014   Unspecified hereditary and idiopathic peripheral neuropathy 03/14/2014   DM2 (diabetes mellitus, type 2) ([place]) 02/14/2014   Aortic stenosis 02/04/2014   Closed left subtrochanteric femur fracture ([place]) 02/03/2014   Hip fracture, left ([place]) 02/03/2014   Fibula fracture 02/03/2014  MTP instability 02/03/2014   Hip fracture ([place]) 02/03/2014    REFERRING DIAG: Deconditioning;   THERAPY DIAG:  Muscle weakness (generalized)  Unsteadiness on feet  Other abnormalities of gait and mobility  Abnormality of gait and mobility  Other lack of coordination  Abnormal posture  Difficulty in walking, not elsewhere classified  Rationale for Evaluation and Treatment Rehabilitation  PERTINENT HISTORY: 81yoF presenting to [facility] to address recent gradual progressive decline in mobility, family goals for improved strength, to promote > participation in transfers to wheelchair and safety with ADL performance. Recent illness includes admission to [facility] on 03/07/21-03/10/21 due  to COVID infection c AMS and fever. Pt then underwent outpatient recanalization/limb salvage of RLE 04/10/2021; LLE salvage performed in June 2022. Other PMH: CHF, DM, HTN, CVA, COPD, multiple remote toe amputations, stg 2 sacral ulcer. PLOF includes 3-4 years bed bound status, hoyerlifts to WC, totalA for ADL performance. Pt is cared for by family, as well as professional caregiver services.  PRECAUTIONS: Fall Risk  SUBJECTIVE: Caregiver reports things are going okay. He reports in the last week she has been up in the chair every day except 2 days. He reports she does her exercises 2x a day. "We have a really hard time doing the bridges, she just can't bend her knees."    PAIN:  Are you having pain? Yes, Patient unable to rate, reports severe pain in bottom and BLE legs     TODAY'S TREATMENT:  Pt transferred wheelchair to mat table with hoyer lift, dependent transfer;  Patient supine on mat table with wedge and 1 pillows;  Patient educated in rolling to help with lower body dressing Pt min A +1 for rolling side/side x2 reps each direction She required cues for bending knee and pulling self over with UE. She showed increased initiation of reaching with UE but has difficulty bending LE to help roll lower body;   Pt sidelying: PT performed passive hip/knee flexion/extension x10 reps each LE with overpressure for better flexibility; Instructed patient in resisted hip/knee flexion/extension with therapist providing manual resistance (minimal) x10 reps each LE with mod VCs for positioning and to participate with exercise;   Instructed patient in LE exercise: SLR hip flexion- pt able to initiate hip flexion ROM but unable to lift leg off table She completed x10 reps with AAROM for increased ROM; She was able to initiate increased ROM this session compared to last session;   SAQ with bolster under LE 2# x15 reps each LE- patient reports moderate difficulty and required frequent cues including  visual cues to increase ROM;  Instructed patient in bridge with bolster under legs x10 reps, partial ROM- unable to fully lift hip off table; Pt/careful educated in importance of hip extension to work on increasing ROM to allow patient to participate in lower body dressing- lifting hips for pulling pants up hips;   Hip flexion/march 2# AAROM x10 reps each with cues for increased ROM; Patient able to initiate exercise but unable to fully lift LE against gravity;   She was dependent to transfer hoyer from mat table to wheelchair +2 for positioning; Patient continues to complain of buttocks pain with sitting;She does have pad in wheelchair but is still uncomfortable.   PATIENT EDUCATION: Education details: plan of care/exercise/positioning;  Person educated: Patient and Spouse Education method: Explanation, Demonstration, and Verbal cues Education comprehension: verbalized understanding, returned demonstration, verbal cues required, and needs further education   HOME EXERCISE PROGRAM: Reinforced importance of letting patient do active LE  ROM/strengthening at home.  Continue as previously given;    PT Short Term Goals -       PT SHORT TERM GOAL #1   Title Patient will be independent in home exercise program to improve strength/mobility for better functional independence with ADLs.    Baseline 10/6: to be initiated, 11/8: caregiver reports she is doing them 3x a day; 1/18: doesn't do much exercise; 10/01/2021= Husband reports she is doing better with her HEP and does better exercising her her son. He states no questions at this time. 4/12: husband and family have been helping with HEP, doing some daily; 6/1: husband and son helping    Time 4    Period Weeks    Status Partially Met    Target Date 11/12/21              PT Long Term Goals -      PT LONG TERM GOAL #1   Title Patient will increase FOTO score to equal to or greater than 44 to demonstrate statistically significant  improvement in mobility and quality of life.    Baseline 10/6: 26, 11/8: 12%, 1/18: 12%, 4/12: deferred; 6/1: 12%    Time 8    Period Weeks    Status Not Met    Target Date 01/10/22      PT LONG TERM GOAL #2   Title Patient will roll L and R in bed with mod A for decreased caregiver burden for ADLs and toileting.    Baseline 10/6: per pt spouse, pt currently dependent, 11/8: mod A, inconsistent, 1/18: needs min A; 10/01/2021=Min//mod assist to right and back to left- Max VC for correct technique. Patient required physical assist with rolling especially moving her legs- she was able to reach and roll her upper torso well with min assist. 4/12: inconsistent max to mod A, able to initiate with UE, requires assistance for LE positioning 6/1: per husband continues to require assistance    Time 8    Period Weeks    Status Partially Met    Target Date 01/10/22      PT LONG TERM GOAL #3   Title Patient will sit upright in WC, with BUE support and without leaning on backrest for 10 minutes for ADL performance and pressure relief.    Baseline 10/6: pt exhibits core contraction but unable to sit upright, 11/8: able to sit upright for <20 sec with mod VCs for erect positioning, 1/18: able to hold for 60 sec with mod VCs. 10/01/2021=Patient was then assessed sitting at [facility]- without back support- unable to maintain > 30 sec without assistance. 4/12: able to sit forward in wheelchair with arms in lap for 10-15 sec, exhibits increased left lateral lean this session after missing a week of PT 6/1: patient refused to attempt    Time 8    Period Weeks    Status Unable to assess    Target Date 01/10/22      PT LONG TERM GOAL #4   Title Pt will demonstrate ability to utilize BUEs to weight-shift to each side in WC and maintain weight shift for 30 seconds to assist in pressure relief and reduce risk of wound formation.    Baseline 10/6: pt currently unable, 11/8: unable to initiate weight shift, able to use UE but  unable to shift hips without assistance; 1/18: requires max cues and min A for weight shift 4/12: able to use UE on arm rest and initiate weight shift but requires significant  increased time, requires min A 6/1: patient refused to attempt    Time 8    Period Weeks    Status Unable to assess    Target Date 01/10/22      PT LONG TERM GOAL #5   Title Patient will be able to transfer sit<>Stand with LRAD, min A +1 to assist with repositioning and pressure relief as well as ADLs/dressing;    Baseline 4/12: requires mod A +2 6/1: patient refused to attempt this session    Time 8    Period Weeks    Status Unable to assess    Target Date 01/10/22               Plan -     Clinical Impression Statement Patient tolerated session fair. Patient does exhibit limited initiative with exercise. She was educated in importance of doing LE ROM/strengthening to facilitate increased strength in LE. Patient is limited with all mobility and depends on caregiver for all mobility. She was educated in importance of increased AROM for strengthening. Caregiver educated in ways to facilitate patient increasing activity at home She exhibits minimal increase in AROM on each LE. Pt does have knee extension contractures with decreased knee flexion ROM which limits some exercise tolerance;  Patient would benefit from additional skilled PT intervention to improve strength, balance and  mobility;    Personal Factors and Comorbidities Age;Time since onset of injury/illness/exacerbation;Comorbidity 3+;Past/Current Experience;Education;Social Background;Transportation;Sex;Fitness    Comorbidities diastolic congestive heart failure, diabetes mellitus, diabetic neuropathy, hypertension, hyperlipidemia, GERD, peripheral vascular disease, CVA. on 9/2 had amputation of L foot second toe MTP joint and LLE revascularization, recent hx of COVID19    Examination-Activity Limitations Bathing;Hygiene/Grooming;Squat;Bed  Mobility;Stairs;Lift;Bend;Locomotion Level;Stand;Caring for [employer];Toileting;Carry;Self Feeding;Transfers;Continence;Dressing;Other;Sit    Examination-Participation Restrictions Cleaning;Shop;Community Activity;Meal Prep;Driving;Other    Stability/Clinical Decision Making Evolving/Moderate complexity    Rehab Potential Poor    PT Frequency 2x / week    PT Duration 8 weeks    PT Treatment/Interventions ADLs/Self Care Home Management;DME Instruction;Functional mobility training;Therapeutic activities;Therapeutic exercise;Neuromuscular re-education;Patient/family education;Wheelchair mobility training;Passive range of motion;Energy conservation;Cryotherapy;Biofeedback;Electrical Stimulation;Iontophoresis 83m/ml Dexamethasone;Moist Heat;Traction;Ultrasound;Stair training;Cognitive remediation;Balance training;Orthotic Fit/Training;Manual techniques;Manual lymph drainage;Splinting;Taping;Vasopneumatic Device;Vestibular;Visual/perceptual remediation/compensation;Gait training    PT Next Visit Plan Consider DC from PT andor transition to [facility]    PT Home Exercise Plan Previous- Seated lateral weight shift pressure relief technique in wheelchair to be performed 5 days/week for 2 sets of 10 repetitions each side.  PT provided patient with picture of technique as well.  05/21/2021- Access Code: [email or web address];Access Code: [email or web address]   Consulted and Agree with Plan of Care Family member/caregiver    Family Member Consulted Spouse                Freddrick Gladson, PT, DPT 03/04/2022, 10:58 AM

## 2022-03-06 ENCOUNTER — Ambulatory Visit: Payer: Medicare HMO

## 2022-03-11 ENCOUNTER — Telehealth: Payer: Self-pay

## 2022-03-11 ENCOUNTER — Ambulatory Visit: Payer: Medicare HMO | Attending: Family Medicine

## 2022-03-11 DIAGNOSIS — R293 Abnormal posture: Secondary | ICD-10-CM | POA: Diagnosis present

## 2022-03-11 DIAGNOSIS — M6281 Muscle weakness (generalized): Secondary | ICD-10-CM | POA: Insufficient documentation

## 2022-03-11 NOTE — Therapy (Signed)
OUTPATIENT PHYSICAL THERAPY TREATMENT NOTE/DISCHARGE   Patient Name: [name] MRN: [id number] DOB:06/08/1941, 81 y.o., female Today's Date: 03/11/2022  PCP: Adamo, Elena MD REFERRING PROVIDER: Adamo, Elena MD   PT End of Session - 03/11/22 1127     Visit Number 57    Number of Visits 73    Date for PT Re-Evaluation 03/11/22    Authorization Type [employer]; Medicaid    Authorization Time Period 11/05/21-02/01/22    PT Start Time 1019    PT Stop Time 1059    PT Time Calculation (min) 40 min    Equipment Utilized During Treatment Gait belt    Activity Tolerance Patient limited by fatigue;Patient limited by pain;Patient tolerated treatment well    Behavior During Therapy Impulsive;Flat affect;Restless              Past Medical History:  Diagnosis Date   Acute heart failure (HCC)    Aortic stenosis    CHF (congestive heart failure) (HCC)    Complication of anesthesia    Hard to wake patient up after having anesthesia   Diabetes mellitus without complication (HCC)    Diabetic neuropathy (HCC)    Takes Lyrica   Edema of both legs    Takes Lasix   GERD (gastroesophageal reflux disease)    High cholesterol    HTN (hypertension)    Hypertension    PAD (peripheral artery disease) (HCC)    Shortness of breath dyspnea    Spasm of back muscles    Stroke (HCC)    Wound, open    Right great toe   Past Surgical History:  Procedure Laterality Date   AMPUTATION TOE Left 06/09/2019   Procedure: Left third toe and partial great toe amputation and debridement;  Surgeon: Schnier, Gregory G, MD;  Location: [facility];  Service: Vascular;  Laterality: Left;   AMPUTATION TOE Left 04/06/2020   Procedure: AMPUTATION LEFT SECOND TOE;  Surgeon: Fowler, Justin, DPM;  Location: [facility];  Service: Podiatry;  Laterality: Left;   BYPASS GRAFT POPLITEAL TO TIBIAL Right 02/28/2016   Procedure: BYPASS GRAFT RIGHT BELOW KNEE POPLITEAL TO PERONEAL USING REVERSED RIGHT GREATER SAPHENOUS VEIN;   Surgeon: Brian L Chen, MD;  Location: [facility];  Service: Vascular;  Laterality: Right;   CYST EXCISION     Abdomen   EYE SURGERY Bilateral    Cataract removal   INTRAMEDULLARY (IM) NAIL INTERTROCHANTERIC Left 02/04/2014   Procedure: INTRAMEDULLARY (IM) NAIL INTERTROCHANTRIC FEMORAL;  Surgeon: Matthew D Olin, MD;  Location: [facility];  Service: Orthopedics;  Laterality: Left;   IR GENERIC HISTORICAL  03/01/2016   IR ANGIO INTRA EXTRACRAN SEL COM CAROTID INNOMINATE UNI R MOD SED 03/01/2016 Sanjeev Deveshwar, MD [facility]   IR GENERIC HISTORICAL  03/01/2016   IR ENDOVASC INTRACRANIAL INF OTHER THAN THROMBO ART INC DIAG ANGIO 03/01/2016 Sanjeev Deveshwar, MD [facility]   IR GENERIC HISTORICAL  03/01/2016   IR INTRAVSC STENT CERV CAROTID W/O EMB-PROT MOD SED INC ANGIO 03/01/2016 Sanjeev Deveshwar, MD [facility]   IR GENERIC HISTORICAL  06/03/2016   IR RADIOLOGIST EVAL & MGMT 06/03/2016 [facility]   LOWER EXTREMITY ANGIOGRAPHY Left 02/09/2019   Procedure: LOWER EXTREMITY ANGIOGRAPHY;  Surgeon: Schnier, Gregory G, MD;  Location: [facility];  Service: Cardiovascular;  Laterality: Left;   LOWER EXTREMITY ANGIOGRAPHY Left 03/10/2019   Procedure: LOWER EXTREMITY ANGIOGRAPHY;  Surgeon: Schnier, Gregory G, MD;  Location: [facility];  Service: Cardiovascular;  Laterality: Left;     LOWER EXTREMITY ANGIOGRAPHY Left 04/27/2019   Procedure: LOWER EXTREMITY ANGIOGRAPHY;  Surgeon: Schnier, Gregory G, MD;  Location: [facility];  Service: Cardiovascular;  Laterality: Left;   LOWER EXTREMITY ANGIOGRAPHY Left 06/08/2019   Procedure: Lower Extremity Angiography;  Surgeon: Schnier, Gregory G, MD;  Location: [facility];  Service: Cardiovascular;  Laterality: Left;   LOWER EXTREMITY ANGIOGRAPHY Left 04/03/2020   Procedure: Lower Extremity Angiography;  Surgeon: Dew, Jason S, MD;  Location: [facility];  Service: Cardiovascular;  Laterality: Left;   LOWER EXTREMITY ANGIOGRAPHY  Right 10/20/2020   Procedure: Lower Extremity Angiography;  Surgeon: Schnier, Gregory G, MD;  Location: [facility];  Service: Cardiovascular;  Laterality: Right;   LOWER EXTREMITY ANGIOGRAPHY Left 01/23/2021   Procedure: LOWER EXTREMITY ANGIOGRAPHY;  Surgeon: Schnier, Gregory G, MD;  Location: [facility];  Service: Cardiovascular;  Laterality: Left;   LOWER EXTREMITY ANGIOGRAPHY Right 04/10/2021   Procedure: LOWER EXTREMITY ANGIOGRAPHY;  Surgeon: Schnier, Gregory G, MD;  Location: [facility];  Service: Cardiovascular;  Laterality: Right;   ORIF TOE FRACTURE Right 02/08/2014   Procedure: OPEN REDUCTION INTERNAL FIXATION Right METATARSAL  FRACTURE ;  Surgeon: John Hewitt, MD;  [facility];  Service: Orthopedics;  Laterality: Right;   PERIPHERAL VASCULAR CATHETERIZATION N/A 12/28/2015   Procedure: Abdominal Aortogram w/Lower Extremity;  Surgeon: Brian L Chen, MD;  [facility];  Service: Cardiovascular;  Laterality: N/A;   RADIOLOGY WITH ANESTHESIA N/A 03/01/2016   Procedure: RADIOLOGY WITH ANESTHESIA;  Surgeon: Sanjeev Deveshwar, MD;  [facility];  Service: Radiology;  Laterality: N/A;   VEIN HARVEST Right 02/28/2016   Procedure: RIGHT GREATER SAPHENOUS VEIN HARVEST;  Surgeon: Brian L Chen, MD;  Location[facility];  Service: Vascular;  Laterality: Right;   Patient Active Problem List   Diagnosis Date Noted   Atherosclerosis of native arteries of extremity with rest pain (HCC) 04/20/2021   COVID-19 virus infection 03/07/2021   Acute febrile illness 03/07/2021   Hypoalbuminemia 03/07/2021   Hyperglycemia due to diabetes mellitus (HCC) 03/07/2021   Elevated troponin 03/07/2021   Constipation 03/07/2021   COPD (chronic obstructive pulmonary disease) (HCC) 10/20/2020   GERD (gastroesophageal reflux disease) 10/20/2020   Physical deconditioning 10/20/2020   Cellulitis 10/19/2020   Necrotic toes (HCC)    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)     Diabetic neuropathy (HCC)    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) 06/06/2019   Peripheral arterial disease (HCC) 04/27/2019   ESBL (extended spectrum beta-lactamase) producing bacteria infection 12/25/2018   PVD (peripheral vascular disease) (HCC) 09/12/2016   Ulcer of right lower extremity (HCC) 08/29/2016   Acute on chronic diastolic heart failure (HCC) 07/08/2016   HTN (hypertension) 07/05/2016   Sensorineural hearing loss (SNHL), 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   Chronic diastolic CHF (congestive heart failure) (HCC) 03/10/2016   HLD (hyperlipidemia) 03/09/2016   Acute respiratory acidosis (HCC) 03/05/2016   Anemia, iron deficiency 03/03/2016   Cerebrovascular accident (CVA) due to thrombosis of precerebral artery (HCC) 03/01/2016   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/01/2016   Atherosclerosis of native arteries of the extremities with gangrene (HCC) 12/08/2015   Anemia, chronic disease 04/27/2014   Unspecified hereditary and idiopathic peripheral neuropathy 03/14/2014   DM2 (diabetes mellitus, type 2) (HCC) 02/14/2014   Aortic stenosis 02/04/2014   Closed left subtrochanteric femur fracture (HCC) 02/03/2014   Hip fracture, left (HCC) 02/03/2014   Fibula fracture 02/03/2014     MTP instability 02/03/2014   Hip fracture (HCC) 02/03/2014    REFERRING DIAG: Deconditioning;   THERAPY DIAG:  Muscle weakness (generalized)  Abnormal posture  Rationale for Evaluation and Treatment Rehabilitation  PERTINENT HISTORY: 81yoF presenting to [facility] to address recent gradual progressive decline in mobility, family goals for improved strength, to promote > participation in transfers to wheelchair and safety with ADL performance. Recent illness includes admission to [facility] on 03/07/21-03/10/21 due to COVID infection c AMS and fever. Pt then underwent outpatient recanalization/limb salvage of RLE 04/10/2021; LLE salvage performed in June 2022. Other PMH: CHF, DM, HTN, CVA, COPD,  multiple remote toe amputations, stg 2 sacral ulcer. PLOF includes 3-4 years bed bound status, hoyerlifts to WC, totalA for ADL performance. Pt is cared for by family, as well as professional caregiver services.  PRECAUTIONS: Fall Risk  SUBJECTIVE: Pt with spouse. Pt report L thumb hurts, reports thumbnail is coming off. Pt spouse says he plans to contact Dorrene's physician about her thumb.   PAIN:   Are you having pain? Yes, pt reports pain in LLE but does not provide rating. Pain also felt L thumb     TODAY'S TREATMENT: 03/11/2022   [employer]Act Goals reviewed for discharge session. PT instructs pt throughout in technique with testing and provides education on indications of test performance to pt and her spouse. PT discusses plan of care (see below)  FOTO: 16  Following completion of goal testing PT provides education on reasoning for discharge for outpatient PT. Discusses benefits of pt receiving home health PT following d/c from outpatient. Instructs pt and her spouse in what to expect, how to continue gains should she start HH PT. PT strongly encourages pt go this route. Pt's spouse hesitant about plan but agrees. PT provides encouragement, tells pt and her spouse she will contact pt's primary care provider regarding seeking HH PT referral.    PT provides band-aid for L thumb (nail not fully off, but starting to come apart) instructs pt and her spouse to contact physician with concerns about thumb pain, loss of nail. Spouse verbalizes understanding.   PATIENT EDUCATION: Education details: goal reassessment, discharge, plan Person educated: Patient and Spouse Education method: Explanation, Demonstration, Tactile cues, and Verbal cues Education comprehension: verbalized understanding, returned demonstration, and verbal cues required   HOME EXERCISE PROGRAM:  Pt to continue HEP as previously given Reinforced importance of letting patient do active LE ROM/strengthening at home.  Continue  as previously given;    PT Short Term Goals -       PT SHORT TERM GOAL #1   Title Patient will be independent in home exercise program to improve strength/mobility for better functional independence with ADLs.    Baseline 10/6: to be initiated, 11/8: caregiver reports she is doing them 3x a day; 1/18: doesn't do much exercise; 10/01/2021= Husband reports she is doing better with her HEP and does better exercising her her son. He states no questions at this time. 4/12: husband and family have been helping with HEP, doing some daily; 6/1: husband and son helping; 8/7: husband and son continuing to help pt   Time 4    Period Weeks    Status Partially Met    Target Date 11/12/21              PT Long Term Goals -      PT LONG TERM GOAL #1   Title Patient will increase FOTO score to equal to or greater than 44 to demonstrate   statistically significant improvement in mobility and quality of life.    Baseline 10/6: 26, 11/8: 12%, 1/18: 12%, 4/12: deferred; 6/1: 12% 8/7: 16%    Time 8    Period Weeks    Status Not Met    Target Date 01/10/22      PT LONG TERM GOAL #2   Title Patient will roll L and R in bed with mod A for decreased caregiver burden for ADLs and toileting.    Baseline 10/6: per pt spouse, pt currently dependent, 11/8: mod A, inconsistent, 1/18: needs min A; 10/01/2021=Min//mod assist to right and back to left- Max VC for correct technique. Patient required physical assist with rolling especially moving her legs- she was able to reach and roll her upper torso well with min assist. 4/12: inconsistent max to mod A, able to initiate with UE, requires assistance for LE positioning 6/1: per husband continues to require assistance; 8/7: Pt's spouse reports he assists pt with rolling, but that she is able to participate some   Time 8    Period Weeks    Status Partially Met    Target Date 01/10/22      PT LONG TERM GOAL #3   Title Patient will sit upright in WC, with BUE support and  without leaning on backrest for 10 minutes for ADL performance and pressure relief.    Baseline 10/6: pt exhibits core contraction but unable to sit upright, 11/8: able to sit upright for <20 sec with mod VCs for erect positioning, 1/18: able to hold for 60 sec with mod VCs. 10/01/2021=Patient was then assessed sitting at EOM- without back support- unable to maintain > 30 sec without assistance. 4/12: able to sit forward in wheelchair with arms in lap for 10-15 sec, exhibits increased left lateral lean this session after missing a week of PT 6/1: patient refused to attempt; 8/7: pt attempts but unable without min-mod assist from PT, holds 15 sec, may also be limited due to difficulty following cues   Time 8    Period Weeks    Status Not met    Target Date 01/10/22      PT LONG TERM GOAL #4   Title Pt will demonstrate ability to utilize BUEs to weight-shift to each side in WC and maintain weight shift for 30 seconds to assist in pressure relief and reduce risk of wound formation.    Baseline 10/6: pt currently unable, 11/8: unable to initiate weight shift, able to use UE but unable to shift hips without assistance; 1/18: requires max cues and min A for weight shift 4/12: able to use UE on arm rest and initiate weight shift but requires significant increased time, requires min A 6/1: patient refused to attempt; 8/7: still able to initiate weight-shift with use of UE on arm-rest, however, unable to sufficiently complete maneuver without mod assist and unable to hold for 30 sec   Time 8    Period Weeks    Status Not met   Target Date 01/10/22      PT LONG TERM GOAL #5   Title Patient will be able to transfer sit<>Stand with LRAD, min A +1 to assist with repositioning and pressure relief as well as ADLs/dressing;    Baseline 4/12: requires mod A +2 6/1: patient refused to attempt this session ; 8/7: unable to attempt due to time limitations    Time 8    Period Weeks    Status Unable to assess     Target   Date 01/10/22               Plan -     Clinical Impression Statement Goals reassessed on this date. Pt has not met or improved on majority of PT goals, reflective of progress plateau. Exception is modest increase in FOTO score to 16 (previously 12). PT had extensive discussion with patient and her spouse regarding indications of testing and plan to discharge on this date. PT encourages pt to seek  referral for home health PT at this time to maximize functional mobility, ADL ability, and caregiver education in her home environment. Pt spouse hesitant but understands plan and agrees to it. Pt seems enthusiastic about HH PT. The pt does not require further outpatient PT services at this time and is to be discharged on this date.     Personal Factors and Comorbidities Age;Time since onset of injury/illness/exacerbation;Comorbidity 3+;Past/Current Experience;Education;Social Background;Transportation;Sex;Fitness    Comorbidities diastolic congestive heart failure, diabetes mellitus, diabetic neuropathy, hypertension, hyperlipidemia, GERD, peripheral vascular disease, CVA. on 9/2 had amputation of L foot second toe MTP joint and LLE revascularization, recent hx of COVID19    Examination-Activity Limitations Bathing;Hygiene/Grooming;Squat;Bed Mobility;Stairs;Lift;Bend;Locomotion Level;Stand;Caring for Others;Reach Overhead;Toileting;Carry;Self Feeding;Transfers;Continence;Dressing;Other;Sit    Examination-Participation Restrictions Cleaning;Shop;Community Activity;Meal Prep;Driving;Other    Stability/Clinical Decision Making Evolving/Moderate complexity    Rehab Potential Poor    PT Frequency 2x / week    PT Duration 8 weeks    PT Treatment/Interventions ADLs/Self Care Home Management;DME Instruction;Functional mobility training;Therapeutic activities;Therapeutic exercise;Neuromuscular re-education;Patient/family education;Wheelchair mobility training;Passive range of motion;Energy  conservation;Cryotherapy;Biofeedback;Electrical Stimulation;Iontophoresis 4mg/ml Dexamethasone;Moist Heat;Traction;Ultrasound;Stair training;Cognitive remediation;Balance training;Orthotic Fit/Training;Manual techniques;Manual lymph drainage;Splinting;Taping;Vasopneumatic Device;Vestibular;Visual/perceptual remediation/compensation;Gait training    PT Next Visit Plan DC on this date, recommends HH PT   PT Home Exercise Plan Previous- Seated lateral weight shift pressure relief technique in wheelchair to be performed 5 days/week for 2 sets of 10 repetitions each side.  PT provided patient with picture of technique as well.  05/21/2021- Access Code: [email or web address];Access Code: [email or web address]    Consulted and Agree with Plan of Care Family member/caregiver    Family Member Consulted Spouse                 R , PT, DPT 03/11/2022, 11:48 AM    

## 2022-03-11 NOTE — Telephone Encounter (Signed)
PT contacted pt's primary care physician's office (Dr. Sherril Cong) regarding discharge from outpatient PT on this date and recommendation that pt transition to home health PT. PT was informed message to be delivered to Dr. Sherril Cong.   [name] PT, DPT

## 2022-03-13 ENCOUNTER — Ambulatory Visit: Payer: Medicare HMO | Admitting: Physical Therapy

## 2022-03-18 ENCOUNTER — Ambulatory Visit: Payer: Medicare HMO | Admitting: Physical Therapy

## 2022-03-20 ENCOUNTER — Ambulatory Visit: Payer: Medicare HMO

## 2022-03-25 ENCOUNTER — Ambulatory Visit: Payer: Medicare HMO | Admitting: Physical Therapy

## 2022-03-27 ENCOUNTER — Ambulatory Visit: Payer: Medicare HMO

## 2022-04-01 ENCOUNTER — Ambulatory Visit: Payer: Medicare HMO

## 2022-04-03 ENCOUNTER — Ambulatory Visit: Payer: Medicare HMO

## 2022-04-10 ENCOUNTER — Ambulatory Visit: Payer: Medicare HMO

## 2022-04-15 ENCOUNTER — Ambulatory Visit: Payer: Medicare HMO

## 2022-04-17 ENCOUNTER — Ambulatory Visit: Payer: Medicare HMO

## 2022-04-22 ENCOUNTER — Ambulatory Visit: Payer: Medicare HMO | Admitting: Physical Therapy

## 2022-04-24 ENCOUNTER — Ambulatory Visit: Payer: Medicare HMO

## 2022-04-29 ENCOUNTER — Ambulatory Visit: Payer: Medicare HMO

## 2022-05-01 ENCOUNTER — Ambulatory Visit: Payer: Medicare HMO

## 2022-05-06 ENCOUNTER — Ambulatory Visit: Payer: Medicare HMO

## 2022-05-08 ENCOUNTER — Ambulatory Visit: Payer: Medicare HMO

## 2022-05-13 ENCOUNTER — Ambulatory Visit: Payer: Medicare HMO

## 2022-05-15 ENCOUNTER — Ambulatory Visit: Payer: Medicare HMO

## 2022-05-20 ENCOUNTER — Ambulatory Visit: Payer: Medicare HMO

## 2022-05-22 ENCOUNTER — Ambulatory Visit: Payer: Medicare HMO

## 2022-05-27 ENCOUNTER — Ambulatory Visit: Payer: Medicare HMO

## 2022-05-29 ENCOUNTER — Ambulatory Visit: Payer: Medicare HMO

## 2022-06-03 ENCOUNTER — Ambulatory Visit: Payer: Medicare HMO

## 2022-06-05 ENCOUNTER — Ambulatory Visit: Payer: Medicare HMO

## 2022-06-10 ENCOUNTER — Ambulatory Visit: Payer: Medicare HMO

## 2022-06-12 ENCOUNTER — Ambulatory Visit: Payer: Medicare HMO

## 2022-06-17 ENCOUNTER — Ambulatory Visit: Payer: Medicare HMO

## 2022-06-19 ENCOUNTER — Ambulatory Visit: Payer: Medicare HMO

## 2022-06-24 ENCOUNTER — Ambulatory Visit: Payer: Medicare HMO

## 2022-06-26 ENCOUNTER — Ambulatory Visit: Payer: Medicare HMO

## 2022-08-02 ENCOUNTER — Encounter (INDEPENDENT_AMBULATORY_CARE_PROVIDER_SITE_OTHER): Payer: Medicare HMO

## 2022-08-02 ENCOUNTER — Ambulatory Visit (INDEPENDENT_AMBULATORY_CARE_PROVIDER_SITE_OTHER): Payer: Medicare HMO | Admitting: Nurse Practitioner

## 2022-08-21 ENCOUNTER — Ambulatory Visit (INDEPENDENT_AMBULATORY_CARE_PROVIDER_SITE_OTHER): Payer: Medicare HMO | Admitting: Nurse Practitioner

## 2022-08-21 ENCOUNTER — Encounter (INDEPENDENT_AMBULATORY_CARE_PROVIDER_SITE_OTHER): Payer: Medicare HMO

## 2022-09-17 ENCOUNTER — Ambulatory Visit (INDEPENDENT_AMBULATORY_CARE_PROVIDER_SITE_OTHER): Payer: Medicare HMO | Admitting: Nurse Practitioner

## 2022-09-17 ENCOUNTER — Encounter (INDEPENDENT_AMBULATORY_CARE_PROVIDER_SITE_OTHER): Payer: Medicare HMO

## 2022-10-02 ENCOUNTER — Ambulatory Visit (INDEPENDENT_AMBULATORY_CARE_PROVIDER_SITE_OTHER): Payer: Medicare HMO

## 2022-10-02 ENCOUNTER — Encounter (INDEPENDENT_AMBULATORY_CARE_PROVIDER_SITE_OTHER): Payer: Self-pay | Admitting: Nurse Practitioner

## 2022-10-02 ENCOUNTER — Ambulatory Visit (INDEPENDENT_AMBULATORY_CARE_PROVIDER_SITE_OTHER): Payer: Medicare HMO | Admitting: Nurse Practitioner

## 2022-10-02 VITALS — BP 135/78 | HR 74 | Resp 18 | Ht 69.0 in

## 2022-10-02 DIAGNOSIS — I10 Essential (primary) hypertension: Secondary | ICD-10-CM | POA: Diagnosis not present

## 2022-10-02 DIAGNOSIS — I739 Peripheral vascular disease, unspecified: Secondary | ICD-10-CM | POA: Diagnosis not present

## 2022-10-02 DIAGNOSIS — E782 Mixed hyperlipidemia: Secondary | ICD-10-CM

## 2022-10-03 LAB — VAS US ABI WITH/WO TBI
Left ABI: 1.09
Right ABI: 1.32

## 2022-10-20 ENCOUNTER — Encounter (INDEPENDENT_AMBULATORY_CARE_PROVIDER_SITE_OTHER): Payer: Self-pay | Admitting: Nurse Practitioner

## 2022-10-20 NOTE — Progress Notes (Signed)
Subjective:    Patient ID: [name], female    DOB: Jun 26, 1941, 82 y.o.   MRN: [id number] Chief Complaint  Patient presents with   Follow-up    f/u in 6 months with abi    The patient returns to the office for followup and review of the noninvasive studies.   There have been no interval changes in lower extremity symptoms. No interval shortening of the patient's claudication distance or development of rest pain symptoms. No new ulcers or wounds have occurred since the last visit.  There have been no significant changes to the patient's overall health care.  The patient denies amaurosis fugax or recent TIA symptoms. There are no documented recent neurological changes noted. There is no history of DVT, PE or superficial thrombophlebitis. The patient denies recent episodes of angina or shortness of breath.   ABI Rt=1.32 and Lt=1.09  (previous ABI's Rt=1.19 and Lt=1.07) Duplex ultrasound of the bilateral tibial vessels reveals biphasic waveforms    Review of Systems  Neurological:  Positive for weakness.  Psychiatric/Behavioral:  Positive for confusion.   All other systems reviewed and are negative.      Objective:   Physical Exam Vitals reviewed.  HENT:     Head: Normocephalic.  Cardiovascular:     Rate and Rhythm: Normal rate.     Pulses:          Dorsalis pedis pulses are detected w/ Doppler on the right side and detected w/ Doppler on the left side.       Posterior tibial pulses are detected w/ Doppler on the right side and detected w/ Doppler on the left side.  Pulmonary:     Effort: Pulmonary effort is normal.  Skin:    General: Skin is warm and dry.  Neurological:     Mental Status: She is alert and oriented to person, place, and time.  Psychiatric:        Mood and Affect: Mood normal.        Behavior: Behavior normal.        Thought Content: Thought content normal.        Judgment: Judgment normal.     BP 135/78 (BP Location: Right Arm)   Pulse 74    Resp 18   Ht 5\' 9"  (1.753 m)   BMI 21.55 kg/m   Past Medical History:  Diagnosis Date   Acute heart failure (HCC)    Aortic stenosis    CHF (congestive heart failure) (HCC)    Complication of anesthesia    Hard to wake patient up after having anesthesia   Diabetes mellitus without complication (HCC)    Diabetic neuropathy (HCC)    Takes Lyrica   Edema of both legs    Takes Lasix   GERD (gastroesophageal reflux disease)    High cholesterol    HTN (hypertension)    Hypertension    PAD (peripheral artery disease) (HCC)    Shortness of breath dyspnea    Spasm of back muscles    Stroke (HCC)    Wound, open    Right great toe    Social History   Socioeconomic History   Marital status: Married    Spouse name: Not on file   Number of children: Not on file   Years of education: Not on file   Highest education level: Not on file  Occupational History   Not on file  Tobacco Use   Smoking status: Never   Smokeless tobacco: Never  Tobacco comments:    Never smoked  Substance and Sexual Activity   Alcohol use: No    Alcohol/week: 0.0 standard drinks of alcohol   Drug use: No   Sexual activity: Never  Other Topics Concern   Not on file  Social History Narrative   Not on file   Social Determinants of Health   Financial Resource Strain: Not on file  Food Insecurity: Not on file  Transportation Needs: Not on file  Physical Activity: Not on file  Stress: Not on file  Social Connections: Not on file  Intimate Partner Violence: Not on file    Past Surgical History:  Procedure Laterality Date   AMPUTATION TOE Left 06/09/2019   Procedure: Left third toe and partial great toe amputation and debridement;  Surgeon: Katha Cabal, MD;  Location: [facility];  Service: Vascular;  Laterality: Left;   AMPUTATION TOE Left 04/06/2020   Procedure: AMPUTATION LEFT SECOND TOE;  Surgeon: Samara Deist, DPM;  Location: [facility];  Service: Podiatry;  Laterality: Left;   BYPASS GRAFT  POPLITEAL TO TIBIAL Right 02/28/2016   Procedure: BYPASS GRAFT RIGHT BELOW KNEE POPLITEAL TO PERONEAL USING REVERSED RIGHT GREATER SAPHENOUS VEIN;  Surgeon: Conrad Slater, MD;  Location: [place];  Service: Vascular;  Laterality: Right;   CYST EXCISION     Abdomen   EYE SURGERY Bilateral    Cataract removal   INTRAMEDULLARY (IM) NAIL INTERTROCHANTERIC Left 02/04/2014   Procedure: INTRAMEDULLARY (IM) NAIL INTERTROCHANTRIC FEMORAL;  Surgeon: Mauri Pole, MD;  Location: [place];  Service: Orthopedics;  Laterality: Left;   IR GENERIC HISTORICAL  03/01/2016   IR ANGIO INTRA EXTRACRAN SEL COM CAROTID INNOMINATE UNI R MOD SED 03/01/2016 [name], MD [facility]   IR GENERIC HISTORICAL  03/01/2016   IR ENDOVASC INTRACRANIAL INF OTHER THAN THROMBO ART INC DIAG ANGIO 03/01/2016 [name], MD [facility]   IR GENERIC HISTORICAL  03/01/2016   IR INTRAVSC STENT CERV CAROTID W/O EMB-PROT MOD SED INC ANGIO 03/01/2016 [name], MD [facility]   IR GENERIC HISTORICAL  06/03/2016   IR RADIOLOGIST EVAL & MGMT 06/03/2016 [facility]   LOWER EXTREMITY ANGIOGRAPHY Left 02/09/2019   Procedure: LOWER EXTREMITY ANGIOGRAPHY;  Surgeon: Katha Cabal, MD;  Location: [place] [facility];  Service: Cardiovascular;  Laterality: Left;   LOWER EXTREMITY ANGIOGRAPHY Left 03/10/2019   Procedure: LOWER EXTREMITY ANGIOGRAPHY;  Surgeon: Katha Cabal, MD;  Location: [place] [facility];  Service: Cardiovascular;  Laterality: Left;   LOWER EXTREMITY ANGIOGRAPHY Left 04/27/2019   Procedure: LOWER EXTREMITY ANGIOGRAPHY;  Surgeon: Katha Cabal, MD;  Location: [place] [facility];  Service: Cardiovascular;  Laterality: Left;   LOWER EXTREMITY ANGIOGRAPHY Left 06/08/2019   Procedure: Lower Extremity Angiography;  Surgeon: Katha Cabal, MD;  Location: [place] [facility];  Service: Cardiovascular;  Laterality: Left;   LOWER EXTREMITY ANGIOGRAPHY Left 04/03/2020   Procedure: Lower Extremity  Angiography;  Surgeon: Algernon Huxley, MD;  Location: [place] [facility];  Service: Cardiovascular;  Laterality: Left;   LOWER EXTREMITY ANGIOGRAPHY Right 10/20/2020   Procedure: Lower Extremity Angiography;  Surgeon: Katha Cabal, MD;  Location: [place] [facility];  Service: Cardiovascular;  Laterality: Right;   LOWER EXTREMITY ANGIOGRAPHY Left 01/23/2021   Procedure: LOWER EXTREMITY ANGIOGRAPHY;  Surgeon: Katha Cabal, MD;  Location: [place] [facility];  Service: Cardiovascular;  Laterality: Left;   LOWER EXTREMITY ANGIOGRAPHY Right 04/10/2021   Procedure: LOWER EXTREMITY ANGIOGRAPHY;  Surgeon:  Schnier, Dolores Lory, MD;  Location: [place] [facility];  Service: Cardiovascular;  Laterality: Right;   ORIF TOE FRACTURE Right 02/08/2014   Procedure: OPEN REDUCTION INTERNAL FIXATION Right METATARSAL  FRACTURE ;  Surgeon: Wylene Simmer, MD;  Location: [place];  Service: Orthopedics;  Laterality: Right;   PERIPHERAL VASCULAR CATHETERIZATION N/A 12/28/2015   Procedure: Abdominal Aortogram w/Lower Extremity;  Surgeon: Conrad Central Pacolet, MD;  Location: [place] [facility];  Service: Cardiovascular;  Laterality: N/A;   RADIOLOGY WITH ANESTHESIA N/A 03/01/2016   Procedure: RADIOLOGY WITH ANESTHESIA;  Surgeon: [name], MD;  Location: [place];  Service: Radiology;  Laterality: N/A;   VEIN HARVEST Right 02/28/2016   Procedure: RIGHT GREATER SAPHENOUS VEIN HARVEST;  Surgeon: Conrad Rensselaer, MD;  Location: [place];  Service: Vascular;  Laterality: Right;    Family History  Problem Relation Age of Onset   Diabetes Other    Liver disease Mother    CVA Father    Diabetes Father    Hypertension Father     Allergies  Allergen Reactions   Egg-Derived Products Shortness Of Breath   Iodine Anaphylaxis   Penicillins Anaphylaxis    Tolerates ceftriaxone, cefazolin Did it involve swelling of the face/tongue/throat, SOB, or low BP? Unknown Did it involve sudden or severe rash/hives, skin peeling, or any  reaction on the inside of your mouth or nose? Unknown Did you need to seek medical attention at a hospital or doctor's office? Unknown When did it last happen?      Unknown If all above answers are "NO", may proceed with cephalosporin use.    Shellfish Allergy Anaphylaxis   Sulfa Antibiotics Anaphylaxis   Sulfacetamide Sodium Anaphylaxis   Sulfasalazine Anaphylaxis   Morphine And Related Other (See Comments)    Altered mental status       Latest Ref Rng & Units 11/04/2021    6:01 PM 06/05/2021   12:53 PM 03/10/2021    6:29 AM  CBC  WBC 4.0 - 10.5 K/uL 14.6  9.9  4.3   Hemoglobin 12.0 - 15.0 g/dL 12.1  10.7  9.8   Hematocrit 36.0 - 46.0 % 40.2  33.8  31.4   Platelets 150 - 400 K/uL 210  261  148       CMP     Component Value Date/Time   NA 139 11/04/2021 1801   K 4.6 11/04/2021 1801   CL 105 11/04/2021 1801   CO2 25 11/04/2021 1801   GLUCOSE 188 (H) 11/04/2021 1801   BUN 14 11/04/2021 1801   CREATININE 0.58 11/04/2021 1801   CALCIUM 9.4 11/04/2021 1801   PROT 6.9 11/04/2021 1801   ALBUMIN 3.5 11/04/2021 1801   AST 22 11/04/2021 1801   ALT 22 11/04/2021 1801   ALKPHOS 72 11/04/2021 1801   BILITOT 0.5 11/04/2021 1801   GFRNONAA >60 11/04/2021 1801   GFRAA >60 04/06/2020 0535     VAS [place] ABI WITH/WO TBI  Result Date: 10/03/2022  LOWER EXTREMITY DOPPLER STUDY Patient Name:  [name]  Date of Exam:   10/02/2022 Medical Rec #: [id number]      Accession #:    [id number] Date of Birth: 13-May-1941       Patient Gender: F Patient Age:   41 years Exam Location:  [facility] Procedure:      VAS [place] ABI WITH/WO TBI Referring Phys: --------------------------------------------------------------------------------  Indications: Peripheral artery disease. High Risk Factors: Hypertension, hyperlipidemia. Other Factors: 03/10/2019 Left PTA popliteal and  ATA.  Vascular Interventions: 04/27/2019: Ultrasound Guided access to the Distal Right                         Anterior Tibial  Artery. PTA of the Right SFA and                         Popliteal Artery. Crosser Atherectomy with PTA of the                         Right ATA.                         06/08/2019: PTA of the Left SFA and Popliteal Artery.                         PTA of the Left Anterior Tibial Artery.                         06/09/2019: Amputation of the Left First and Third Toe;                         04/10/21: Right SFA/popliteal & ATA                         thrombectomy/PTA/stent;. Performing Technologist: Concha Norway RVT  Examination Guidelines: A complete evaluation includes at minimum, Doppler waveform signals and systolic blood pressure reading at the level of bilateral brachial, anterior tibial, and posterior tibial arteries, when vessel segments are accessible. Bilateral testing is considered an integral part of a complete examination. Photoelectric Plethysmograph (PPG) waveforms and toe systolic pressure readings are included as required and additional duplex testing as needed. Limited examinations for reoccurring indications may be performed as noted.  ABI Findings: +---------+------------------+-----+--------+--------+ Right    Rt Pressure (mmHg)IndexWaveformComment  +---------+------------------+-----+--------+--------+ Brachial 127                                     +---------+------------------+-----+--------+--------+ ATA      168               1.32 biphasic         +---------+------------------+-----+--------+--------+ PTA      163               1.28 biphasic         +---------+------------------+-----+--------+--------+ Great Toe108               0.85                  +---------+------------------+-----+--------+--------+ +--------+------------------+-----+--------+-------+ Left    Lt Pressure (mmHg)IndexWaveformComment +--------+------------------+-----+--------+-------+ [id number]                                    +--------+------------------+-----+--------+-------+  ATA     139               1.09 biphasic        +--------+------------------+-----+--------+-------+ PTA     133               1.05 biphasic        +--------+------------------+-----+--------+-------+ +-------+-----------+-----------+------------+------------+ ABI/TBIToday's ABIToday's TBIPrevious ABIPrevious TBI +-------+-----------+-----------+------------+------------+ Right  1.32       .85        1.19        .78          +-------+-----------+-----------+------------+------------+ Left   1.09       amp        1.07        amp          +-------+-----------+-----------+------------+------------+ Bilateral ABIs appear essentially unchanged compared to prior study on 01/2022.  Summary: Right: Resting right ankle-brachial index is within normal range. The right toe-brachial index is normal. Left: Resting left ankle-brachial index is within normal range. Left Great toe amputated. *See table(s) above for measurements and observations.  Electronically signed by Hortencia Pilar MD on 10/03/2022 at 6:36:48 PM.    Final        Assessment & Plan:   1. PAD (peripheral artery disease) (HCC)  Recommend:  The patient has evidence of atherosclerosis of the lower extremities with claudication.  The patient does not voice lifestyle limiting changes at this point in time.  Noninvasive studies do not suggest clinically significant change.  No invasive studies, angiography or surgery at this time The patient should continue walking and begin a more formal exercise program.  The patient should continue antiplatelet therapy and aggressive treatment of the lipid abnormalities  No changes in the patient's medications at this time  Continued surveillance is indicated as atherosclerosis is likely to progress with time.    There is a small area concerning for preulcerative changes.  According to the patient studies today she should be able to heal any issues however we will have her follow-up in  3 months just to maintain close follow-up. 2. Primary hypertension Continue antihypertensive medications as already ordered, these medications have been reviewed and there are no changes at this time.  3. Mixed hyperlipidemia Continue statin as ordered and reviewed, no changes at this time   Current Outpatient Medications on File Prior to Visit  Medication Sig Dispense Refill   acetaminophen (TYLENOL) 500 MG tablet Take 1,000 mg by mouth every 6 (six) hours as needed for mild pain.     amLODipine (NORVASC) 10 MG tablet Take 10 mg by mouth daily.     aspirin EC 81 MG EC tablet Take 1 tablet (81 mg total) by mouth daily.     atorvastatin (LIPITOR) 20 MG tablet Take 1 tablet (20 mg total) by mouth daily. (Patient taking differently: Take 20 mg by mouth at bedtime.) 30 tablet 11   budesonide (PULMICORT) 0.25 MG/2ML nebulizer solution Take 2 mLs (0.25 mg total) by nebulization 2 (two) times daily. 60 mL 12   carbamide peroxide (DEBROX) 6.5 % OTIC solution Place 5 drops into both ears 2 (two) times daily as needed (ear wax).     Carboxymethylcellul-Glycerin (LUBRICATING EYE DROPS OP) Place 1 drop into both eyes daily as needed (dry eyes).     carvedilol (COREG) 6.25 MG tablet Take 6.25 mg by mouth 2 (two) times daily with a meal.     cefdinir (OMNICEF) 300 MG capsule Take 1 capsule (300 mg total) by mouth 2 (two) times daily. 10 capsule 0   cetirizine (ZYRTEC) 10 MG tablet Take 10 mg by mouth daily.     cholecalciferol (VITAMIN D3) 25 MCG (1000 UNIT) tablet Take 1,000 Units by mouth daily.     clopidogrel (PLAVIX) 75 MG tablet Take 1 tablet (75 mg total) by mouth daily with breakfast. 30 tablet 0   diclofenac Sodium (VOLTAREN)  1 % GEL Apply 1 application topically 4 (four) times daily as needed (pain).     docusate sodium (COLACE) 100 MG capsule Take 100 mg by mouth daily.     fluticasone (FLONASE) 50 MCG/ACT nasal spray Place 1 spray into both nostrils daily as needed for allergies.       gabapentin (NEURONTIN) 300 MG capsule Take 300 mg by mouth at bedtime.     hydrALAZINE (APRESOLINE) 25 MG tablet Take 1 tablet (25 mg total) by mouth every 8 (eight) hours. 60 tablet 0   insulin glargine (LANTUS) 100 UNIT/ML injection Inject 0.1 mLs (10 Units total) into the skin at bedtime. (Patient taking differently: Inject 18 Units into the skin at bedtime.)     magnesium hydroxide (MILK OF MAGNESIA) 400 MG/5ML suspension Take 15 mLs by mouth daily as needed for mild constipation.     magnesium oxide (MAG-OX) 400 MG tablet Take 400 mg by mouth 2 (two) times daily.      modafinil (PROVIGIL) 200 MG tablet Take 200 mg by mouth daily.     NYAMYC powder Apply 1 application topically daily.     omeprazole (PRILOSEC) 20 MG capsule Take 1 capsule (20 mg total) by mouth daily.     polyethylene glycol (MIRALAX / GLYCOLAX) packet Take 17 g by mouth 2 (two) times daily. (Patient taking differently: Take 17 g by mouth daily as needed for moderate constipation.) 14 each 0   potassium chloride SA (K-DUR,KLOR-CON) 20 MEQ tablet Take 20 mEq by mouth daily.     torsemide (DEMADEX) 20 MG tablet Take 1 tablet (20 mg total) by mouth 2 (two) times daily.     Vitamin E 180 MG CAPS Take 180 mg by mouth daily.     No current facility-administered medications on file prior to visit.    There are no Patient Instructions on file for this visit. No follow-ups on file.   Kris Hartmann, NP

## 2022-11-06 ENCOUNTER — Ambulatory Visit: Payer: Medicare HMO | Admitting: Physical Therapy

## 2022-12-04 ENCOUNTER — Other Ambulatory Visit
Admission: RE | Admit: 2022-12-04 | Discharge: 2022-12-04 | Disposition: A | Discharge status: Home / Self Care | Payer: Medicare HMO | Source: Ambulatory Visit | Attending: Internal Medicine | Admitting: Internal Medicine

## 2022-12-04 ENCOUNTER — Encounter: Payer: Medicare HMO | Attending: Internal Medicine | Admitting: Internal Medicine

## 2022-12-04 DIAGNOSIS — I739 Peripheral vascular disease, unspecified: Secondary | ICD-10-CM | POA: Diagnosis not present

## 2022-12-04 DIAGNOSIS — I5032 Chronic diastolic (congestive) heart failure: Secondary | ICD-10-CM | POA: Insufficient documentation

## 2022-12-04 DIAGNOSIS — I110 Hypertensive heart disease with heart failure: Secondary | ICD-10-CM | POA: Insufficient documentation

## 2022-12-04 DIAGNOSIS — L97514 Non-pressure chronic ulcer of other part of right foot with necrosis of bone: Secondary | ICD-10-CM | POA: Diagnosis not present

## 2022-12-04 DIAGNOSIS — E11621 Type 2 diabetes mellitus with foot ulcer: Secondary | ICD-10-CM | POA: Insufficient documentation

## 2022-12-04 DIAGNOSIS — I70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 of foot: Secondary | ICD-10-CM | POA: Insufficient documentation

## 2022-12-04 DIAGNOSIS — Z794 Long term (current) use of insulin: Secondary | ICD-10-CM | POA: Insufficient documentation

## 2022-12-04 DIAGNOSIS — J449 Chronic obstructive pulmonary disease, unspecified: Secondary | ICD-10-CM | POA: Insufficient documentation

## 2022-12-04 DIAGNOSIS — Z9981 Dependence on supplemental oxygen: Secondary | ICD-10-CM | POA: Diagnosis not present

## 2022-12-04 DIAGNOSIS — B9689 Other specified bacterial agents as the cause of diseases classified elsewhere: Secondary | ICD-10-CM | POA: Diagnosis not present

## 2022-12-04 DIAGNOSIS — L03115 Cellulitis of right lower limb: Secondary | ICD-10-CM | POA: Diagnosis present

## 2022-12-04 DIAGNOSIS — G4730 Sleep apnea, unspecified: Secondary | ICD-10-CM | POA: Diagnosis not present

## 2022-12-08 LAB — AEROBIC CULTURE W GRAM STAIN (SUPERFICIAL SPECIMEN): Gram Stain: NONE SEEN

## 2022-12-11 ENCOUNTER — Ambulatory Visit
Admission: RE | Admit: 2022-12-11 | Discharge: 2022-12-11 | Disposition: A | Discharge status: Home / Self Care | Payer: Medicare HMO | Attending: Internal Medicine | Admitting: Internal Medicine

## 2022-12-11 ENCOUNTER — Other Ambulatory Visit: Payer: Self-pay | Admitting: Internal Medicine

## 2022-12-11 ENCOUNTER — Encounter: Payer: Medicare HMO | Admitting: Internal Medicine

## 2022-12-11 ENCOUNTER — Ambulatory Visit
Admission: RE | Admit: 2022-12-11 | Discharge: 2022-12-11 | Disposition: A | Discharge status: Home / Self Care | Payer: Medicare HMO | Source: Ambulatory Visit | Attending: Internal Medicine | Admitting: Internal Medicine

## 2022-12-11 DIAGNOSIS — S91301A Unspecified open wound, right foot, initial encounter: Secondary | ICD-10-CM | POA: Insufficient documentation

## 2022-12-11 DIAGNOSIS — E11621 Type 2 diabetes mellitus with foot ulcer: Secondary | ICD-10-CM | POA: Diagnosis not present

## 2022-12-13 NOTE — Progress Notes (Signed)
[name], White ([id number]) [id number] Page 1 of 9 Visit Report for 12/11/2022 HPI Details Patient Name: Date of Service: [name], [name] 12/11/2022 2:00 PM Medical Record Number: [id number] Patient Account Number: [id number] Date of Birth/Sex: Treating RN: 1941/05/29 (82 y.o. Female) [name] Primary Care Provider: Beverely Low Other Clinician: Referring Provider: Treating Provider/Extender: RO BSO N, MICHA EL Lorrin Goodell Weeks in Treatment: 1 History of Present Illness HPI Description: 08/13/16: This is a 82 year old woman who came predominantly for review of 3 cm in diameter circular wound to the left anterior lateral leg. She was in the ER on 08/01/16 I reviewed their notes. There was apparently pus coming out of the wound at that time and the patient arrived requesting debridement which they don't do in the emergency room. Nevertheless I can't see that they did any x-rays. There were no cultures done. She is a type II diabetic and I a note after the patient was in the clinic that she had a bypass graft from the popliteal to the tibial on the right on 02/28/16. She also had a right greater saphenous vein harvest on the same date for arterial bypass. She is going to have vascular studies including ABIs T ABIs on the right on 08/28/16. The patient's surgery was on 02/28/16 by Dr. Johny Drilling she had a right below the knee popliteal artery to peroneal artery bypass with reverse greater saphenous vein and an endarterectomy of the mid segment peroneal artery. Postoperatively she had a strong mild monophasic peroneal signal with a pink foot. It would appear that the patient is had some nonhealing in the surgical saphenous vein harvest site on the left leg. Surprisingly looking through [facility] I cannot see much information about this at all. Dr. Ruthe Mannan notes from 05/29/16 show that the patient's wounds "are not healed" the right first metatarsal wound healed but then  opened back up. 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 At that point the patient had a wound VAC to her right calf with regards to the wounds on her dorsal right toe would appear that these are felt to be arterial wounds. She has had surgery on the metatarsal phalangeal in 2015 I Dr. Victorino Dike secondary to a right metatarsal phalangeal joint fracture. She is apparently had discoloration around this area since then. 08/28/16; patient arrives with her wounds in much the same condition. The linear vein harvest site and the circular wound below it which I think was a blister. She also has to probing holes in her right great toe and a necrotic eschar on the right second toe. Because of these being arterial wounds I reduced her compression from 3-2 layers this seems to of done satisfactorily she has not had any problems. I cannot see that she is actually had an x-ray ====== 11/06/16 the patient comes in for evaluation of her right lower extremity ulcers. She was here in January 2018 for 2 visits subsequently ended up in the hospital with pneumonia and then to rehabilitation. She has now been discharged from rehabilitation and is home. She has multiple ulcerations to her right lower extremity including the foot and toes. She does have home health in place and they have been placing alginate to the ulcers. She is followed by Dr. Imogene Burn of vascular medicine. 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 She  recently saw him on 3/23. In office ABIs were: Right 0.48 with monophasic flow to the DP, PT peroneal , Left 0.63 with monophasic flow to the DP and PT Her arterial studies indicated a patent right below knee popliteal to peroneal bypass She had an MRI in February 2008 that was negative frosty myelitis but this showed general soft tissue  edema in the right foot and lower extremity concerning for cellulitis She is a diabetic, managed with insulin. Her hemoglobin A1c in December 2017 was 8.4 which is a trend up from previous levels. She had blood work in February 2018 which revealed an albumin of 2.6 this appears to be relatively acute as an albumin in November 2017 was 3.7 11/13/16; this is a patient I have not seen since February who is readmitted to our clinic last week. 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 Imogene Burn. I have reviewed Dr. Nicky Pugh notes from March/23/18. Doppler ABI on that date showed an ABI on the right of 0.48 and on the left of 0.63. Dorsalis pedis waveforms were monophasic bilaterally. There was no waveforms detected at the posterior tibial on the right, monophasic on the left. Dr. Nicky Pugh comments were that this patient would have follow-up vascular studies in 3 months including ABIs and right lower extremity arterial duplex. She had an MRI in February that was negative for osteomyelitis but showed generalized edema in the foot. Last albumin I see was in January at 3.4 we have been using Santyl to the 4 wounds on the right leg the patient is noted today to have widespread edema well up towards her groin this is pitting 2-3+. 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 Normal ejection fraction. 11/20/16; patient has a follow-up appointment with Dr. Imogene Burn on April 23. She is still complaining of a lot of pain in the right foot and right leg. It is not clear to me that this is at all positional however I think it is clear claudication with minimal activity perhaps at rest. At our suggestion she is return to her primary physician's office tomorrow with regards to her pitting lower extremity bilateral edema that I reviewed in detail last week 11/27/16; the follow-up with Dr. Imogene Burn was actually on  May 23 on April 23 as I stated in my note last week. N/A case all of her wounds seems somewhat smaller. The 2 on the right leg are definitely smaller. The areas on the dorsal right first toe, right third toe and the lateral part of the right fifth metatarsal head all looks smaller but have tightly adherent surfaces. We have been using Santyl 12/04/16; follow-up with Dr. Imogene Burn on May 23. 2 small open wounds on the right leg continue to get smaller. The area on the right third total lateral aspect of the right fifth toe also look better. The remaining area on the dorsal first toe still has some depth to it. We have been using Santyl to the toes and collagen on the right 12/11/16; according to patient's husband the follow-up with Dr. Imogene Burn is not until July. All of the wounds on the right leg are measuring smaller. We have been using some combination of Prisma and Santyl although I think we can go to straight Prisma today. There may have been some confusion with home health about the primary dressing orders here. 12/25/16; the patient has had some healing this  week. The area on her right lateral fifth metatarsal head, right third toe are both healed lower right leg is healed. In the vein harvest site superiorly she has one superficial open area. On the dorsal aspect of her right great toe/MTP joint the wound is now divided into 2 however the proximal area is deep and there is palpable bone 01/01/17; still an open area in the middle of her original right surgical scar. The area on the right third toe and right lateral fifth metatarsal head remained closed. Problematic area on the dorsal aspect of the toe. Previous surgery in this area line 01/08/17 small open area on the original scar on her upper anterior leg although this is closing. X-ray I did of the right first toe did not show underlying bony abnormality. Still this area on the dorsal first toe probes to bone. We have been using Prisma 01/15/17; small open  area in the original scar in the upper anterior leg is almost fully closed. She has 2 open areas over the dorsal aspect of the right first toe that probes to bone. Used and a form starting last week. Vigorous bone scraping that I did last week showed few methicillin sensitive staph aureus. She is allergic to penicillin and sulfa drugs. I'm going to give her 2 weeks of doxycycline. She will need an MRI with contrast. 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 She continues on doxycycline for a bone scraping that showed methicillin sensitive staph aureus [allergic to penicillin and sulfa]. We have been using Endo form to the wound. 01/29/17; surprisingly her MRI did not show osteomyelitis. She continues on doxycycline for a bone scraping that showed methicillin sensitive staph aureus with allergies to penicillin and sulfa. We have been using Endo form to the wound. Unfortunately I cannot get a surface on this visit looks like it is able to support a MISHEEL, JOKINEN V ([id number]) [id number] Page 2 of 9 healing state. Proximally there is still exposed bone. There is no overt soft tissue tenderness. Her MRI did show a previous fracture was surgery to this area but no hardware 02/12/17 on evaluation today patient appears to be doing well in regard to her lower extremity wound. She does have some mild discomfort but this is minimal. There's no evidence of infection. Her left great toe nail has somewhat been lifted up and there is a little bit of slight bleeding underneath but this is still firmly attached. 02/19/17; she ran out of doxycycline 2 days ago. Nevertheless I would like to continue this for 2 weeks to make a full 6 weeks of therapy as the bone scraping that I did from the open area on the dorsal right first toe showed MRSA. This should complete antibiotic therapy. 02/26/17; the patient is completing  her 6 weeks of oral doxycycline. Deep wound on the dorsal right toe. This still has some exposed bone proximally. She does have a reasonably granulated surface albeit thin over bone. I've been using Endo form have made application for an amniotic skin sub 03/05/17; the patient has completed 6 weeks of doxycycline. This is a deep wound on the dorsal right toe which has exposed bone proximally. She has granulation over most of this wound albeit a thin layer. I've been using Endo form. Still do not have approval for Affinity 03/13/17 on evaluation today patient's right foot wound appears to be doing better measurement wise  compared to her last evaluation. She has been tolerating the dressing change without complication and does have a appointment with the dietary that has been made as far as referral is concerned there just waiting for her and transportation to contact them back for an actual date. Nonetheless patient has been having less pain at this point. No fevers, chills, nausea, or vomiting noted at this time. 03/26/17; linear wound over the dorsal aspect of her right great toe. At one point this had exposed bone on the most superior aspect however I am pleased to see today that this appears to have a surface of granulation. Apparently product was applied for but denied by [facility]. We'll need to see what that was. The patient has known PAD status post revascularization. MRI did not show osteomyelitis which was done in June 04/02/17; continued improvement using Endoform. She was approved for [employer] but hasn't over $200 co-pay per application, this is beyond her means 04/09/17; continues on Endoform. 04/16/17; appears to be doing nicely continuing on Endoform 04/22/17; patient did not have her dressing changed all week because of the weather. Using Endoform. Base of the wound looks healthy elbow there appears to events surrounding maceration perhaps because of the drainage was not changing the wound  dressing 04/30/17 wound today continues to close and except for a small divot on the superior part of the wound. This area did not probe the bone but I found this a little concerning as this was the area with exposed bone before we be able to get this to granulate forward. As I remember things she is not a candidate for skin substitutes secondary to an outlandish co-pay. I asked her husband to look into the out of pocket max for medications if he has 1 [Regranex] or totally for skin substitutes 05/07/17; the patient arrives today with a wound roughly the same size although under careful inspection under the light there appears to be some epithelialization. Her intake nurse noted that the Endoform seemed to be placed over the wound rather than in the deeper divots they could really benefit from the Endoform. At this point I have no plans to change the Endoform as at one point this was at least 33% exposed bone and the rest of the wound very close to the underlying phalanx 05/14/17; no major change in the size or appearance of the wound. We have been using Endoform for a prolonged period of time with reasonably good improvement in the epithelialization i.e. no exposed bone especially proximally. Unless we've not made a lot of changes in the last several weeks. She does not complain of pain. She was revascularized early this year or perhaps late last year by Dr. Imogene Burn and I wonder if he will need to have a look at her, I will need to review the actual vascular procedure which I think was a distal popliteal bypass 05/21/17; switched to either for a blue last week. Patient arrived in clinic with the wound not measuring any better but the surface looking some better. Unfortunately she had some surface slough and when I went to remove this superiorly she had exposed bone. Previously she had had exposed bone in the inferior part of the wound however this is granulated over some weeks ago. Clearly this is a major  step back for her. With some difficulty I was able to obtain a piece of the bone probing through the superior part of the wound for culture. We did not send pathology. It is likely  that she is going to need imaging of this site but I did not order this today 05/28/17; bone culture I took last week showed MSSA. She still has exposed bone however I could not get another piece for pathology. She had an MRI 4 months ago that did not show osteoarthritic at time. Wound rapidly deteriorated superiorly and I suspect this is underlying osteomyelitis. We have been using Hydrofera Blue 06/04/17 on evaluation today patient's wound appears to actually be doing a little bit better visually compared to last week's evaluation which is good news. Nonetheless she does have osteomyelitis of the toe which does not appear to be MRSA. No fevers, chills, nausea, or vomiting noted at this time. She is having no discomfort. 06/11/17; patient's wound looks a little healthier than I remember seeing it 2 weeks ago. There is no exposed bone and the surface of the wound appears well granulated. We've been using hydrofera blue. I note that she was started on doxycycline which was MSSA. Her appointment with Dr. Sampson Goon is on November 12 I believe. 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 Johny Drilling of vascular surgery on 11/9. He offered right leg angiography possible intervention however the patient refused. I've discussed this with the patient's husband today she did not want any further procedures. Also noteworthy that Dr. Johny Drilling stated that this wound had not healed in 3 years, I was not aware of this degree of chronicity in this area. She has underlying osteomyelitis by bone culture. Culture of this grew MSSA, she is allergic to penicillin and therefore not a candidate for beta lactams. She saw Dr. Sampson Goon of infectious disease this morning, he continued her on doxycycline for another month and  apparently sent in a prescription. We have been using Hydrofera Blue. ABI's update by Dr. Imogene Burn right 0.62, left 0.89. Monophasic wave forms 06/25/17 on evaluation today patient appears to be doing well in regard to her right great toe wound. She is still taking the doxycycline as prescribed by Dr. Sampson Goon. The Hydrofera Blue Dressing's to be doing as well as anything has for her up to this point and we are still waiting on an insurance authorization for the Apligraft. She is having no pain which is good news. No fevers, chills, nausea, or vomiting noted at this time. 07/02/17; still taking doxycycline as prescribed by Dr. Sampson Goon. Hydrofera Blue continues. We have no palpable bone. Still have not had had approval of Apligraf 08/06/16; the patient arrives today with [facility] even though we apparently had changed to Sorbact last time. Her co-pay for Regranex is $389 in discussion with the patient and her husband this was excessive. We'll continue with the Sorbact and standard dressings for now 08/20/17; we have been using sorbact. The wound bed still requires debridement. Wound measurements are slightly better. 09/03/17;using Sorbact.no debridement today. Wound measurements slightly better. There is some discoloration of the tip of her toelooks like bruising 09/17/17; using Sorbact. No debridement today. Wound dimensions are about the same. Still some discoloration at the tip of her great toe. This does not look any worse than last time 10/01/17; using sorbact right great toe I thought she might have surface epithelialization last time although it is certainly not look like that today nevertheless her dimensions are better. Continued surface eschar on the tip of her toe but this is not progressing. She is actually complaining of the left great toe 10/15/17; this is a very difficult area on the dorsal aspect of her proximal  right great toe. At one point this wound had exposed bone however we have  managed to get some degree of epithelialization. She was revascularized by Dr. Johny Drilling in [place]. She saw Dr. Sampson Goon for underlying osteomyelitis. She is not a candidate for advanced treatment options [insurance issues]. She comes in today with a new area on the tip of her right great toe. The exact history here is unclear. We have been using sorbact 10/29/17; patient arrives today with very significant bilateral increase edema which appears to extend into the thighs. This is tight and not particularly pitting however it looks a lot more impressive than I remember. Not surprisingly the wounds on her right great toe have increased in size with weeping edema fluid as the edema extends into the dorsal foot itself. She also has a wound on the tip of her right great toe which is a new wound from 2 weeks ago. We have been using sorbact. 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 He is on Demadex presumably twice a day at 20 mg. I have asked her family to make her an appointment with her primary physicians at the [place] clinic to go over this degree of edema. This is causing I think deterioration in the right great toe wound. She also has significant PAD and is status post right leg bypass graft. I think this was done by [facility]. I believe they also said that there was nothing further that could be done with regards to her vascular status. 11/12/17; patient is going to see her primary doctor this afternoon with regards to lower extremity edema. She has 2 open wounds on the right great toe the original wound on the dorsal proximal part and then a more necrotic looking area on the tip of her right great toe. We took some time today to review her vascular status. The patient had a relate below knee popliteal to peroneal artery bypass with reversed greater saphenous vein on 06/13/17; she also  had endarterectomy of the midsegment of the peroneal artery. The patient's course was complicated by a bilateral CVA and was found to have near occlusion of Mccranie, Varonica V ([id number]) [id number] Page 3 of 9 the left internal carotid artery that required interventional radiology stenting. I think at some point in time after that she was offered an arteriogram on the right but she declined. I've used Silver collagen to her wounds recently. 11/26/17; patient has 2 open wounds on the right great toe tip as well as the dorsal aspect of her right great toe. She has known PAD. I've been trying to get her back to see vein and vascular in [facility] to see if there are options for endovascular surgery to help with perfusion although the patient and her husband may not want any more surgery. We've been using silver alginate because of maceration. She does not have an option for advanced treatment/skin substitutes 12/10/17; continued open wounds much the same as 2 weeks ago on the dorsal aspect of the right great toe on the right great toe tip. I've encouraged to follow- up with Dr. Imogene Burn of vein and vascular to explore the possibility that there may be correctable ischemia involved in nonhealing these areas. We made considerable progress on the dorsal right great toe however it is stalled recently. We do not have an advanced treatment option. Originally this had exposed bone however there is a granulated base. 12/24/17; patient  went back to see vein and vascular in [place]. She was seen by Langston Reusing NP. ABIs on the right were noted to be 0.84 and TBI of the right and 0.62. On the left her ABI was 0.82 and her TBI of 0.75. Waveforms were monophasic at the posterior tibial and dorsalis pedis bilaterally. From the town of the note she was going to be considered for an arteriogram although the patient does not want an arteriogram out of concern for her periprocedural stroke that  she had previously during an attempted this. Her husband reiterates today that she does not want an arteriogram therefore I'm not going to press the issue. We've been using silver collagen to the wounds on the tip of the right great toe and dorsal surface of the right great toe. 01/07/18; patient arrives today with the tip of her toe healed. An cerebral amount of slough over the wound on the dorsal toe. As noted on 12/24/17 no options currently for revascularization the patient will not allow an arteriogram 01/21/18; typical of her right great toe remains healed. Still large amount of slough over the wound on the dorsal toe. No options for revascularization. We've been using Medihoney 02/04/18; no major change. Still a punched out area on the right great toe dorsal wound. Covered in a difficult necrotic debris. 02/18/18; no major change. Still a punched out area over the right great toe. They're using medihoney 03/04/18; no major change. The wound is stalled. Still a punched out area over the right dorsal great toe. We've been using medihone for about a month not much change in the wound dimensions or the characteristics of the wound surface. We've previously used Iodoflex, Hydrofera Blue, sorbact, and I think at one point [place]. She has PAD and does not have any revascularization options although a wound distally on the tip of the great toe did close over 03/25/18; absolutely no change. Still adherent debris over the wound surface there is no palpable bone but the wound still has some depth. This is predominantly a arterial wound and she basically has refused further attempts at revascularization. I would consider this a totally palliative situation except for she managed to heal the wound at the tip of the same toe some months ago. I changed her back to Iodoflex The other issue is that [facility] called and wants to pare back on the visits which have been 3 times a week. They will drop to 2  times a week next week 04/29/18; since the patient was last here she was admitted to hospital from 9/6 through 9/13. She was noted to have altered LOC, declining ability to walk. 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 She was discharged to [facility] healthcare. She is on oxygen chronically at 3 L. I don't think they've been doing anything specific to the wound. Her husband laments that she is no longer able to stand and walk or transfer. Yet he seems determined to take her home 05/13/18; 2 week follow-up. The patient is still at [facility]. She arrives lethargic in clinic. She was also confused during her index hospitalization. She was felt to have metabolic encephalopathy secondary to UTI. Although she also had hypercapnic respiratory failure secondary to COPD and sleep apnea. She is on chronic oxygen. Paradoxically her wound actually looks a lot better and this is really filled in nicely. We're  using silver collagen 06/03/18 3 week follow-up. The [facility] but was supposed to be discharged home today. She arrives in clinic virtually unresponsive. Her wound is actually healed 12/04/2022 Ms. Migdalia Saleeby is an 82 year old female with a past medical history of insulin-dependent type 2 diabetes, peripheral arterial disease and COPD that presents the clinic for a right foot wound. She states this happened spontaneously about 4 weeks ago. Unfortunately there is bone exposed today. She has been keeping the area covered. She denies systemic signs of infection. 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 She follows with vein and vascular for peripheral arterial disease. On 04/2021 she had a right lower extremity arteriogram with stent placement in the right superficial femoral and popliteal arteries and right anterior  tibial artery. She last saw them in February 2024. At that time she did not have wounds. 5/8; this is a patient is been in the clinic at variable times through the years. She has known type 2 diabetes with severe PAD. She was followed for a period of time with Dr. Gilda Crease. She has a wound on the dorsal aspect of the right foot just proximal to the metatarsal head. This probes easily to bone. Culture last week showed methicillin sensitive Staph aureus. This was shown to to [employer] and doxycycline was ordered. The patient is allergic to penicillin. She also had an x-ray done but I do not have these results. For reasons that are not totally clear the doxycycline has not started I made it clear to her husband that needed to start this tonight. The patient has a deep probing wound to bone. She has hardware in this area as well. This goes back to 2015 when she had a metatarsal fracture repaired by Dr. Victorino Dike Electronic Signature(s) Signed: 12/11/2022 4:41:35 PM By: Baltazar Najjar MD Entered By: Baltazar Najjar on 12/11/2022 16:23:46 -------------------------------------------------------------------------------- Physical Exam Details Patient Name: Date of Service: Windle Guard. 12/11/2022 2:00 PM Medical Record Number: [id number] Patient Account Number: [id number] Date of Birth/Sex: Treating RN: [birth year] (82 y.o. Female) [name] Primary Care Provider: Beverely Low Other Clinician: Referring Provider: Treating Provider/Extender: RO BSO N, MICHA EL Shea Evans, Elena Weeks in Treatment: 1 Constitutional Sitting or standing Blood Pressure is within target range for patient.. Pulse regular and within target range for patient.[name] Respirations regular, non-labored and within target range.. Temperature is normal and within the target range for the patient.[name] appears in no distress. Notes NAYAH, SEEKINGS ([id number]) [id number] Page 4 of 9 Wound exam; dorsal aspect of the  right foot proximal to the metatarsal head there was an open area with exposed bone. The area is painful but no obvious spreading infection. Electronic Signature(s) Signed: 12/11/2022 4:41:35 PM By: Baltazar Najjar MD Entered By: Baltazar Najjar on 12/11/2022 16:24:32 -------------------------------------------------------------------------------- Physician Orders Details Patient Name: Date of Service: Windle Guard. 12/11/2022 2:00 PM Medical Record Number: [id number] Patient Account Number: [id number] Date of Birth/Sex: Treating RN: [birth year] (82 y.o. Female) [name] Primary Care Provider: Beverely Low Other Clinician: Referring Provider: Treating Provider/Extender: RO BSO N, MICHA EL Lorrin Goodell Weeks in Treatment: 1 Verbal / Phone Orders: No Diagnosis Coding Follow-up Appointments Return Appointment in 1 week. Bathing/ Shower/ Hygiene May shower with wound dressing protected with water repellent cover or cast protector. Anesthetic (Use 'Patient Medications' Section for Anesthetic Order Entry) Lidocaine applied to wound bed Edema Control - Lymphedema / Segmental Compressive Device / Other Elevate, Exercise  Daily and A void Standing for Long Periods of Time. Elevate legs to the level of the heart and pump ankles as often as possible Elevate leg(s) parallel to the floor when sitting. Wound Treatment Wound #12 - Foot Wound Laterality: Dorsal, Right, Distal Cleanser: Soap and Water 1 x Per Day/30 Days Discharge Instructions: Gently cleanse wound with antibacterial soap, rinse and pat dry prior to dressing wounds Prim Dressing: Gauze 1 x Per Day/30 Days ary Discharge Instructions: moistened with Dakins Solution Secondary Dressing: Gauze 1 x Per Day/30 Days Secured With: Medipore T - 10M Medipore H Soft Cloth Surgical T ape ape, 2x2 (in/yd) 1 x Per Day/30 Days Electronic Signature(s) Signed: 12/11/2022 4:41:35 PM By: Baltazar Najjar MD Signed: 12/13/2022 9:26:52 AM By: [name] Pax RN Entered By: [name] on 12/11/2022 14:43:18 -------------------------------------------------------------------------------- Problem List Details Patient Name: Date of Service: Windle Guard. 12/11/2022 2:00 PM Medical Record Number: [id number] Patient Account Number: [id number] Date of Birth/Sex: Treating RN: 1940/12/25 (83 y.o. Female) [name] Primary Care Provider: Beverely Low Other Clinician: Referring Provider: Treating Provider/Extender: RO BSO N, MICHA EL Lorrin Goodell Weeks in Treatment: 1 Active Problems ICD-10 Encounter Code Description Active Date MDM Diagnosis L97.514 Non-pressure chronic ulcer of other part of right foot with necrosis of bone 12/04/2022 No Yes Kosiba, Celes V ([id number]) [id number] Page 5 of 9 E11.621 Type 2 diabetes mellitus with foot ulcer 12/04/2022 No Yes I73.9 Peripheral vascular disease, unspecified 12/04/2022 No Yes J44.9 Chronic obstructive pulmonary disease, unspecified 12/04/2022 No Yes Z79.4 Long term (current) use of insulin 12/04/2022 No Yes Inactive Problems Resolved Problems Electronic Signature(s) Signed: 12/11/2022 4:41:35 PM By: Baltazar Najjar MD Entered By: Baltazar Najjar on 12/11/2022 16:20:40 -------------------------------------------------------------------------------- Progress Note Details Patient Name: Date of Service: Windle Guard. 12/11/2022 2:00 PM Medical Record Number: [id number] Patient Account Number: [id number] Date of Birth/Sex: Treating RN: 27-Jul-1941 (82 y.o. Female) [name] Primary Care Provider: Beverely Low Other Clinician: Referring Provider: Treating Provider/Extender: RO BSO N, MICHA EL Lorrin Goodell Weeks in Treatment: 1 Subjective History of Present Illness (HPI) 08/13/16: This is a 82 year old woman who came predominantly for review of 3 cm in diameter circular wound to the left anterior lateral leg. She was in the ER on 08/01/16 I reviewed their notes. There  was apparently pus coming out of the wound at that time and the patient arrived requesting debridement which they don't do in the emergency room. Nevertheless I can't see that they did any x-rays. There were no cultures done. She is a type II diabetic and I a note after the patient was in the clinic that she had a bypass graft from the popliteal to the tibial on the right on 02/28/16. She also had a right greater saphenous vein harvest on the same date for arterial bypass. She is going to have vascular studies including ABIs T ABIs on the right on 08/28/16. The patient's surgery was on 02/28/16 by Dr. Johny Drilling she had a right below the knee popliteal artery to peroneal artery bypass with reverse greater saphenous vein and an endarterectomy of the mid segment peroneal artery. Postoperatively she had a strong mild monophasic peroneal signal with a pink foot. It would appear that the patient is had some nonhealing in the surgical saphenous vein harvest site on the left leg. Surprisingly looking through [facility] I cannot see much information about this at all. Dr. Ruthe Mannan notes from 05/29/16 show that the patient's wounds "are not healed" the right first metatarsal wound  healed but then opened back up. 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 At that point the patient had a wound VAC to her right calf with regards to the wounds on her dorsal right toe would appear that these are felt to be arterial wounds. She has had surgery on the metatarsal phalangeal in 2015 I Dr. Victorino Dike secondary to a right metatarsal phalangeal joint fracture. She is apparently had discoloration around this area since then. 08/28/16; patient arrives with her wounds in much the same condition. The linear vein harvest site and the circular wound below it which I think was a blister. She also has to probing holes in her right great toe  and a necrotic eschar on the right second toe. Because of these being arterial wounds I reduced her compression from 3-2 layers this seems to of done satisfactorily she has not had any problems. I cannot see that she is actually had an x-ray ====== 11/06/16 the patient comes in for evaluation of her right lower extremity ulcers. She was here in January 2018 for 2 visits subsequently ended up in the hospital with pneumonia and then to rehabilitation. She has now been discharged from rehabilitation and is home. She has multiple ulcerations to her right lower extremity including the foot and toes. She does have home health in place and they have been placing alginate to the ulcers. She is followed by Dr. Imogene Burn of vascular medicine. 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 She recently saw him on 3/23. In office ABIs were: Right 0.48 with monophasic flow to the DP, PT peroneal , Left 0.63 with monophasic flow to the DP and PT Her arterial studies indicated a patent right below knee popliteal to peroneal bypass She had an MRI in February 2008 that was negative frosty myelitis but this showed general soft tissue edema in the right foot and lower extremity concerning for cellulitis She is a diabetic, managed with insulin. Her hemoglobin A1c in December 2017 was 8.4 which is a trend up from previous levels. She had blood work in February 2018 which revealed an albumin of 2.6 this appears to be relatively acute as an albumin in November 2017 was 3.7 11/13/16; this is a patient I have not seen since February who is readmitted to our clinic last week. 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 Imogene Burn. I have reviewed Dr. Nicky Pugh notes from March/23/18. Doppler ABI on that date showed an ABI on the right of 0.48 and on the left of 0.63. Dorsalis pedis waveforms were monophasic  bilaterally. There was no waveforms detected at the posterior tibial on the right, monophasic on the left. Dr. Nicky Pugh comments were that this patient would have follow-up vascular studies in 3 months including ABIs and right lower extremity arterial duplex. She had an MRI in February that was negative for osteomyelitis but showed generalized edema in the foot. Last albumin I see was in January at 3.4 we have been using Santyl to the 4 wounds on the right leg Craddock, Cydnee V ([id number]) [id number] Page 6 of 9 the patient is noted today to have widespread edema well up towards her groin this is pitting 2-3+. 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  Normal ejection fraction. 11/20/16; patient has a follow-up appointment with Dr. Imogene Burn on April 23. She is still complaining of a lot of pain in the right foot and right leg. It is not clear to me that this is at all positional however I think it is clear claudication with minimal activity perhaps at rest. At our suggestion she is return to her primary physician's office tomorrow with regards to her pitting lower extremity bilateral edema that I reviewed in detail last week 11/27/16; the follow-up with Dr. Imogene Burn was actually on May 23 on April 23 as I stated in my note last week. N/A case all of her wounds seems somewhat smaller. The 2 on the right leg are definitely smaller. The areas on the dorsal right first toe, right third toe and the lateral part of the right fifth metatarsal head all looks smaller but have tightly adherent surfaces. We have been using Santyl 12/04/16; follow-up with Dr. Imogene Burn on May 23. 2 small open wounds on the right leg continue to get smaller. The area on the right third total lateral aspect of the right fifth toe also look better. The remaining area on the dorsal first toe still has some depth to it. We have been using Santyl to the toes and collagen  on the right 12/11/16; according to patient's husband the follow-up with Dr. Imogene Burn is not until July. All of the wounds on the right leg are measuring smaller. We have been using some combination of Prisma and Santyl although I think we can go to straight Prisma today. There may have been some confusion with home health about the primary dressing orders here. 12/25/16; the patient has had some healing this week. The area on her right lateral fifth metatarsal head, right third toe are both healed lower right leg is healed. In the vein harvest site superiorly she has one superficial open area. On the dorsal aspect of her right great toe/MTP joint the wound is now divided into 2 however the proximal area is deep and there is palpable bone 01/01/17; still an open area in the middle of her original right surgical scar. The area on the right third toe and right lateral fifth metatarsal head remained closed. Problematic area on the dorsal aspect of the toe. Previous surgery in this area line 01/08/17 small open area on the original scar on her upper anterior leg although this is closing. X-ray I did of the right first toe did not show underlying bony abnormality. Still this area on the dorsal first toe probes to bone. We have been using Prisma 01/15/17; small open area in the original scar in the upper anterior leg is almost fully closed. She has 2 open areas over the dorsal aspect of the right first toe that probes to bone. Used and a form starting last week. Vigorous bone scraping that I did last week showed few methicillin sensitive staph aureus. She is allergic to penicillin and sulfa drugs. I'm going to give her 2 weeks of doxycycline. She will need an MRI with contrast. 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 She continues on doxycycline for a bone scraping that showed methicillin sensitive staph aureus [allergic to penicillin  and sulfa]. We have been using Endo form to the wound. 01/29/17; surprisingly her MRI did not show osteomyelitis. She continues on doxycycline for a bone scraping that showed methicillin sensitive staph aureus with allergies  to penicillin and sulfa. We have been using Endo form to the wound. Unfortunately I cannot get a surface on this visit looks like it is able to support a healing state. Proximally there is still exposed bone. There is no overt soft tissue tenderness. Her MRI did show a previous fracture was surgery to this area but no hardware 02/12/17 on evaluation today patient appears to be doing well in regard to her lower extremity wound. She does have some mild discomfort but this is minimal. There's no evidence of infection. Her left great toe nail has somewhat been lifted up and there is a little bit of slight bleeding underneath but this is still firmly attached. 02/19/17; she ran out of doxycycline 2 days ago. Nevertheless I would like to continue this for 2 weeks to make a full 6 weeks of therapy as the bone scraping that I did from the open area on the dorsal right first toe showed MRSA. This should complete antibiotic therapy. 02/26/17; the patient is completing her 6 weeks of oral doxycycline. Deep wound on the dorsal right toe. This still has some exposed bone proximally. She does have a reasonably granulated surface albeit thin over bone. I've been using Endo form have made application for an amniotic skin sub 03/05/17; the patient has completed 6 weeks of doxycycline. This is a deep wound on the dorsal right toe which has exposed bone proximally. She has granulation over most of this wound albeit a thin layer. I've been using Endo form. Still do not have approval for Affinity 03/13/17 on evaluation today patient's right foot wound appears to be doing better measurement wise compared to her last evaluation. She has been tolerating the dressing change without complication and does have a  appointment with the dietary that has been made as far as referral is concerned there just waiting for her and transportation to contact them back for an actual date. Nonetheless patient has been having less pain at this point. No fevers, chills, nausea, or vomiting noted at this time. 03/26/17; linear wound over the dorsal aspect of her right great toe. At one point this had exposed bone on the most superior aspect however I am pleased to see today that this appears to have a surface of granulation. Apparently product was applied for but denied by [facility]. We'll need to see what that was. The patient has known PAD status post revascularization. MRI did not show osteomyelitis which was done in June 04/02/17; continued improvement using Endoform. She was approved for [employer] but hasn't over $200 co-pay per application, this is beyond her means 04/09/17; continues on Endoform. 04/16/17; appears to be doing nicely continuing on Endoform 04/22/17; patient did not have her dressing changed all week because of the weather. Using Endoform. Base of the wound looks healthy elbow there appears to events surrounding maceration perhaps because of the drainage was not changing the wound dressing 04/30/17 wound today continues to close and except for a small divot on the superior part of the wound. This area did not probe the bone but I found this a little concerning as this was the area with exposed bone before we be able to get this to granulate forward. As I remember things she is not a candidate for skin substitutes secondary to an outlandish co-pay. I asked her husband to look into the out of pocket max for medications if he has 1 [Regranex] or totally for skin substitutes 05/07/17; the patient arrives today with a wound roughly the  same size although under careful inspection under the light there appears to be some epithelialization. Her intake nurse noted that the Endoform seemed to be placed over the wound rather  than in the deeper divots they could really benefit from the Endoform. At this point I have no plans to change the Endoform as at one point this was at least 33% exposed bone and the rest of the wound very close to the underlying phalanx 05/14/17; no major change in the size or appearance of the wound. We have been using Endoform for a prolonged period of time with reasonably good improvement in the epithelialization i.e. no exposed bone especially proximally. Unless we've not made a lot of changes in the last several weeks. She does not complain of pain. She was revascularized early this year or perhaps late last year by Dr. Imogene Burn and I wonder if he will need to have a look at her, I will need to review the actual vascular procedure which I think was a distal popliteal bypass 05/21/17; switched to either for a blue last week. Patient arrived in clinic with the wound not measuring any better but the surface looking some better. Unfortunately she had some surface slough and when I went to remove this superiorly she had exposed bone. Previously she had had exposed bone in the inferior part of the wound however this is granulated over some weeks ago. Clearly this is a major step back for her. With some difficulty I was able to obtain a piece of the bone probing through the superior part of the wound for culture. We did not send pathology. It is likely that she is going to need imaging of this site but I did not order this today 05/28/17; bone culture I took last week showed MSSA. She still has exposed bone however I could not get another piece for pathology. She had an MRI 4 months ago that did not show osteoarthritic at time. Wound rapidly deteriorated superiorly and I suspect this is underlying osteomyelitis. We have been using Hydrofera Blue 06/04/17 on evaluation today patient's wound appears to actually be doing a little bit better visually compared to last week's evaluation which is good  news. Nonetheless she does have osteomyelitis of the toe which does not appear to be MRSA. No fevers, chills, nausea, or vomiting noted at this time. She is having no discomfort. 06/11/17; patient's wound looks a little healthier than I remember seeing it 2 weeks ago. There is no exposed bone and the surface of the wound appears well granulated. We've been using hydrofera blue. I note that she was started on doxycycline which was MSSA. Her appointment with Dr. Sampson Goon is on November 12 I believe. 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 Johny Drilling of vascular surgery on 11/9. He offered right leg angiography possible intervention however the patient refused. I've discussed this with the patient's husband today she did not want any further procedures. Also noteworthy that Dr. Johny Drilling stated that this wound had not healed in 3 years, I was not aware of this degree of chronicity in this area. She has underlying osteomyelitis by bone culture. Culture of this grew MSSA, she is allergic to penicillin and therefore not a candidate for beta lactams. She saw Dr. Sampson Goon of infectious disease this morning, he continued her on doxycycline for another month and apparently sent in a prescription. We have been using Hydrofera Blue. ABI's update by Dr. Imogene Burn right 0.62, left 0.89. Monophasic wave forms 06/25/17  on evaluation today patient appears to be doing well in regard to her right great toe wound. She is still taking the doxycycline as prescribed by Dr. Sampson Goon. The Hydrofera Blue Dressing's to be doing as well as anything has for her up to this point and we are still waiting on an insurance authorization for the Apligraft. She is having no pain which is good news. No fevers, chills, nausea, or vomiting noted at this time. AMERY, NAKAMOTO ([id number]) [id number] Page 7 of 9 07/02/17; still taking doxycycline as prescribed by Dr. Sampson Goon. Hydrofera Blue  continues. We have no palpable bone. Still have not had had approval of Apligraf 08/06/16; the patient arrives today with [facility] even though we apparently had changed to Sorbact last time. Her co-pay for Regranex is $389 in discussion with the patient and her husband this was excessive. We'll continue with the Sorbact and standard dressings for now 08/20/17; we have been using sorbact. The wound bed still requires debridement. Wound measurements are slightly better. 09/03/17;using Sorbact.no debridement today. Wound measurements slightly better. There is some discoloration of the tip of her toelooks like bruising 09/17/17; using Sorbact. No debridement today. Wound dimensions are about the same. Still some discoloration at the tip of her great toe. This does not look any worse than last time 10/01/17; using sorbact right great toe I thought she might have surface epithelialization last time although it is certainly not look like that today nevertheless her dimensions are better. Continued surface eschar on the tip of her toe but this is not progressing. She is actually complaining of the left great toe 10/15/17; this is a very difficult area on the dorsal aspect of her proximal right great toe. At one point this wound had exposed bone however we have managed to get some degree of epithelialization. She was revascularized by Dr. Johny Drilling in [place]. She saw Dr. Sampson Goon for underlying osteomyelitis. She is not a candidate for advanced treatment options [insurance issues]. She comes in today with a new area on the tip of her right great toe. The exact history here is unclear. We have been using sorbact 10/29/17; patient arrives today with very significant bilateral increase edema which appears to extend into the thighs. This is tight and not particularly pitting however it looks a lot more impressive than I remember. Not surprisingly the wounds on her right great toe have increased in size with weeping  edema fluid as the edema extends into the dorsal foot itself. She also has a wound on the tip of her right great toe which is a new wound from 2 weeks ago. We have been using sorbact. 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 He is on Demadex presumably twice a day at 20 mg. I have asked her family to make her an appointment with her primary physicians at the [facility] to go over this degree of edema. This is causing I think deterioration in the right great toe wound. She also has significant PAD and is status post right leg bypass graft. I think this was done by [facility]. I believe they also said that there was nothing further that could be done with regards to her vascular status. 11/12/17; patient is going to see her primary doctor this afternoon with regards to lower extremity edema. She has 2 open wounds on the right great toe the original wound on the dorsal proximal part and then  a more necrotic looking area on the tip of her right great toe. We took some time today to review her vascular status. The patient had a relate below knee popliteal to peroneal artery bypass with reversed greater saphenous vein on 06/13/17; she also had endarterectomy of the midsegment of the peroneal artery. 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 I think at some point in time after that she was offered an arteriogram on the right but she declined. I've used Silver collagen to her wounds recently. 11/26/17; patient has 2 open wounds on the right great toe tip as well as the dorsal aspect of her right great toe. She has known PAD. I've been trying to get her back to see vein and vascular in [facility] to see if there are options for endovascular surgery to help with perfusion  although the patient and her husband may not want any more surgery. We've been using silver alginate because of maceration. She does not have an option for advanced treatment/skin substitutes 12/10/17; continued open wounds much the same as 2 weeks ago on the dorsal aspect of the right great toe on the right great toe tip. I've encouraged to follow- up with Dr. Imogene Burn of vein and vascular to explore the possibility that there may be correctable ischemia involved in nonhealing these areas. We made considerable progress on the dorsal right great toe however it is stalled recently. We do not have an advanced treatment option. Originally this had exposed bone however there is a granulated base. 12/24/17; patient went back to see vein and vascular in [place]. She was seen by Langston Reusing NP. ABIs on the right were noted to be 0.84 and TBI of the right and 0.62. On the left her ABI was 0.82 and her TBI of 0.75. Waveforms were monophasic at the posterior tibial and dorsalis pedis bilaterally. 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 Her husband reiterates today that she does not want an arteriogram therefore I'm not going to press the issue. We've been using silver collagen to the wounds on the tip of the right great toe and dorsal surface of the right great toe. 01/07/18; patient arrives today with the tip of her toe healed. An cerebral amount of slough over the wound on the dorsal toe. As noted on 12/24/17 no options currently for revascularization the patient will not allow an arteriogram 01/21/18; typical of her right great toe remains healed. Still large amount of slough over the wound on the dorsal toe. No options for revascularization. We've been using Medihoney 02/04/18; no major change. Still a punched out area on the right  great toe dorsal wound. Covered in a difficult necrotic debris. 02/18/18; no major change. Still a punched out area over the right great toe. They're using medihoney 03/04/18; no major change. The wound is stalled. Still a punched out area over the right dorsal great toe. We've been using medihone for about a month not much change in the wound dimensions or the characteristics of the wound surface. We've previously used Iodoflex, Hydrofera Blue, sorbact, and I think at one point [place]. She has PAD and does not have any revascularization options although a wound distally on the tip of the  great toe did close over 03/25/18; absolutely no change. Still adherent debris over the wound surface there is no palpable bone but the wound still has some depth. This is predominantly a arterial wound and she basically has refused further attempts at revascularization. I would consider this a totally palliative situation except for she managed to heal the wound at the tip of the same toe some months ago. I changed her back to Iodoflex The other issue is that [facility] has called and wants to pare back on the visits which have been 3 times a week. They will drop to 2 times a week next week 04/29/18; since the patient was last here she was admitted to hospital from 9/6 through 9/13. She was noted to have altered LOC, declining ability to walk. 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 She was discharged to [facility]. She is on oxygen chronically at 3 L. I don't think they've been doing anything specific to the wound. Her husband laments that she is no longer able to stand and walk or transfer. Yet he seems determined to take her home 05/13/18; 2 week follow-up. The patient is still at [facility]. She arrives lethargic in clinic. She was also confused  during her index hospitalization. She was felt to have metabolic encephalopathy secondary to UTI. Although she also had hypercapnic respiratory failure secondary to COPD and sleep apnea. She is on chronic oxygen. Paradoxically her wound actually looks a lot better and this is really filled in nicely. We're using silver collagen 06/03/18 3 week follow-up. The [facility] is on [facility] but was supposed to be discharged home today. She arrives in clinic virtually unresponsive. Her wound is actually healed 12/04/2022 Ms. Mathel Brainard is an 82 year old female with a past medical history of insulin-dependent type 2 diabetes, peripheral arterial disease and COPD that presents the clinic for a right foot wound. She states this happened spontaneously about 4 weeks ago. Unfortunately there is bone exposed today. She has been keeping the area covered. She denies systemic signs of infection. 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 She follows with vein and vascular for peripheral arterial disease. On 04/2021 she had a right lower extremity arteriogram with stent placement in the right superficial femoral and popliteal arteries and right anterior tibial artery. She last saw them in February 2024. At that time she did not have wounds. 5/8; this is a patient is been in the clinic at variable times through the years. She has known type 2 diabetes with severe PAD. She was followed for a period of time with Dr. Gilda Crease. She has a wound on the dorsal aspect of the right foot just proximal to the metatarsal head. This probes easily to bone. Culture last week showed methicillin sensitive Staph aureus. This was shown to to [employer] and doxycycline was ordered. The patient is allergic to penicillin. She also had an x-ray done but I do not have these results. For reasons that are not totally clear the doxycycline has not started I made it clear to her husband that needed to start this  tonight. The patient has a deep probing wound to bone. She has hardware in this area as well. This goes back to 2015 when she had a metatarsal fracture repaired by Ilda Mori ([id number]) [id number] Page 8 of 9  Dr. Victorino Dike Objective Constitutional Sitting or standing Blood Pressure is within target range for patient.. Pulse regular and within target range for patient.[name] Respirations regular, non-labored and within target range.. Temperature is normal and within the target range for the patient.[name] appears in no distress. Vitals Time Taken: 2:24 PM, Height: 64 in, Weight: 145 lbs, BMI: 24.9, Temperature: 98.2 F, Pulse: 74 bpm, Respiratory Rate: 18 breaths/min, Blood Pressure: 139/79 mmHg. General Notes: Wound exam; dorsal aspect of the right foot proximal to the metatarsal head there was an open area with exposed bone. The area is painful but no obvious spreading infection. Integumentary (Hair, Skin) Wound #12 status is Open. Original cause of wound was Gradually Appeared. The date acquired was: 11/04/2022. The wound has been in treatment 1 weeks. The wound is located on the Right,Distal,Dorsal Foot. The wound measures 1cm length x 0.7cm width x 0.5cm depth; 0.55cm^2 area and 0.275cm^3 volume. There is bone and Fat Layer (Subcutaneous Tissue) exposed. There is no tunneling or undermining noted. There is a medium amount of serosanguineous drainage noted. There is small (1-33%) pink granulation within the wound bed. There is a large (67-100%) amount of necrotic tissue within the wound bed including Adherent Slough. Assessment Active Problems ICD-10 Non-pressure chronic ulcer of other part of right foot with necrosis of bone Type 2 diabetes mellitus with foot ulcer Peripheral vascular disease, unspecified Chronic obstructive pulmonary disease, unspecified Long term (current) use of insulin Plan Follow-up Appointments: Return Appointment in 1 week. Bathing/ Shower/  Hygiene: May shower with wound dressing protected with water repellent cover or cast protector. Anesthetic (Use 'Patient Medications' Section for Anesthetic Order Entry): Lidocaine applied to wound bed Edema Control - Lymphedema / Segmental Compressive Device / Other: Elevate, Exercise Daily and Avoid Standing for Long Periods of Time. Elevate legs to the level of the heart and pump ankles as often as possible Elevate leg(s) parallel to the floor when sitting. WOUND #12: - Foot Wound Laterality: Dorsal, Right, Distal Cleanser: Soap and Water 1 x Per Day/30 Days Discharge Instructions: Gently cleanse wound with antibacterial soap, rinse and pat dry prior to dressing wounds Prim Dressing: Gauze 1 x Per Day/30 Days ary Discharge Instructions: moistened with Dakins Solution Secondary Dressing: Gauze 1 x Per Day/30 Days Secured With: Medipore T - 50M Medipore H Soft Cloth Surgical T ape ape, 2x2 (in/yd) 1 x Per Day/30 Days 1. Worrisome diabetic wound on the dorsal right foot that probes to bone. 2. A culture which I believe was a swab culture has showed methicillin Staph aureus I made it clear that the doxycycline needs to start ASAP 3. A x-ray is pending was done this morning I do not have these results 4. She has known severe PAD. 5. she has underlying hardware in this area I believe. 6. We are using Vashe solution wet the dye I have continued this. Electronic Signature(s) Signed: 12/11/2022 4:41:35 PM By: Baltazar Najjar MD Entered By: Baltazar Najjar on 12/11/2022 16:26:25 Ilda Mori ([id number]) [id number] Page 9 of 9 -------------------------------------------------------------------------------- SuperBill Details Patient Name: Date of Service: SARAII, GUTIERRES 12/11/2022 Medical Record Number: [id number] Patient Account Number: [id number] Date of Birth/Sex: Treating RN: November 14, 1940 (82 y.o. Female) [name] Primary Care Provider: Beverely Low Other  Clinician: Referring Provider: Treating Provider/Extender: RO BSO N, MICHA EL Lorrin Goodell Weeks in Treatment: 1 Diagnosis Coding ICD-10 Codes Code Description [phone] Non-pressure chronic ulcer of other part of right foot with necrosis of bone E11.621 Type 2 diabetes mellitus with foot ulcer  I73.9 Peripheral vascular disease, unspecified J44.9 Chronic obstructive pulmonary disease, unspecified Z79.4 Long term (current) use of insulin Facility Procedures : CPT4 Code: [id number] Description: 54098 - WOUND CARE VISIT-LEV 2 EST PT Modifier: Quantity: 1 Physician Procedures : CPT4 Code Description Modifier [id number] - WC PHYS LEVEL 4 - EST PT ICD-10 Diagnosis Description L97.514 Non-pressure chronic ulcer of other part of right foot with necrosis of bone E11.621 Type 2 diabetes mellitus with foot ulcer I73.9 Peripheral  vascular disease, unspecified Quantity: 1 Electronic Signature(s) Signed: 12/11/2022 4:41:35 PM By: Baltazar Najjar MD Entered By: Baltazar Najjar on 12/11/2022 16:27:12

## 2022-12-13 NOTE — Progress Notes (Signed)
[name] ([id number]) [id number] Page 1 of 8 Visit Report for 12/11/2022 Arrival Information Details Patient Name: Date of Service: [name], [name] 12/11/2022 2:00 PM Medical Record Number: [id number] Patient Account Number: [id number] Date of Birth/Sex: Treating RN: 10-Aug-1940 (82 y.o. Female) Yevonne Pax Primary Care Indie Nickerson: Beverely Low Other Clinician: Referring Anola Mcgough: Treating Brittnei Jagiello/Extender: RO BSO N, MICHA EL Lorrin Goodell Weeks in Treatment: 1 Visit Information History Since Last Visit Added or deleted any medications: No Patient Arrived: Wheel Chair Any new allergies or adverse reactions: No Arrival Time: 14:33 Had a fall or experienced change in No Accompanied By: husband activities of daily living that may affect Transfer Assistance: None risk of falls: Patient Identification Verified: Yes Signs or symptoms of abuse/neglect since last visito No Secondary Verification Process Completed: Yes Hospitalized since last visit: No Patient Requires Transmission-Based No Implantable device outside of the clinic excluding No Precautions: cellular tissue based products placed in the center Patient Has Alerts: Yes since last visit: Patient Alerts: Patient on Blood Thinner Has Dressing in Place as Prescribed: Yes R ABI 1.32 TBI .85 2/28/2 Pain Present Now: No L ABI 1.09 TBI amp 2/28/2 Electronic Signature(s) Signed: 12/13/2022 9:26:52 AM By: Yevonne Pax RN Entered By: Yevonne Pax on 12/11/2022 14:34:24 -------------------------------------------------------------------------------- Clinic Level of Care Assessment Details Patient Name: Date of Service: [name], [name] 12/11/2022 2:00 PM Medical Record Number: [id number] Patient Account Number: [id number] Date of Birth/Sex: Treating RN: 1941/02/24 (82 y.o. Female) Epps, Lyla Son Primary Care Chantee Cerino: Beverely Low Other Clinician: Referring Minnette Merida: Treating Rosielee Corporan/Extender: RO BSO N,  MICHA EL Lorrin Goodell Weeks in Treatment: 1 Clinic Level of Care Assessment Items TOOL 4 Quantity Score X- 1 0 Use when only an EandM is performed on FOLLOW-UP visit ASSESSMENTS - Nursing Assessment / Reassessment X- 1 10 Reassessment of Co-morbidities (includes updates in patient status) X- 1 5 Reassessment of Adherence to Treatment Plan ASSESSMENTS - Wound and Skin A ssessment / Reassessment X - Simple Wound Assessment / Reassessment - one wound 1 5 []  - 0 Complex Wound Assessment / Reassessment - multiple wounds []  - 0 Dermatologic / Skin Assessment (not related to wound area) ASSESSMENTS - Focused Assessment []  - 0 Circumferential Edema Measurements - multi extremities []  - 0 Nutritional Assessment / Counseling / Intervention []  - 0 Lower Extremity Assessment (monofilament, tuning fork, pulses) []  - 0 Peripheral Arterial Disease Assessment (using hand held doppler) ASSESSMENTS - Ostomy and/or Continence Assessment and Care []  - 0 Incontinence Assessment and Management []  - 0 Ostomy Care Assessment and Management (repouching, etc.) PROCESS - Coordination of Care X - Simple Patient / Family Education for ongoing care 1 15 Bessent, [place] V ([id number]) [id number] Page 2 of 8 []  - 0 Complex (extensive) Patient / Family Education for ongoing care []  - 0 Staff obtains Chiropractor, Records, T Results / Process Orders est []  - 0 Staff telephones HHA, Nursing Homes / Clarify orders / etc []  - 0 Routine Transfer to another Facility (non-emergent condition) []  - 0 Routine Hospital Admission (non-emergent condition) []  - 0 New Admissions / Manufacturing engineer / Ordering NPWT Apligraf, etc. , []  - 0 Emergency Hospital Admission (emergent condition) X- 1 10 Simple Discharge Coordination []  - 0 Complex (extensive) Discharge Coordination PROCESS - Special Needs []  - 0 Pediatric / Minor Patient Management []  - 0 Isolation Patient  Management []  - 0 Hearing / Language / Visual special needs []  - 0 Assessment of Community assistance (transportation, D/C planning, etc.) []  - 0  Additional assistance / Altered mentation []  - 0 Support Surface(s) Assessment (bed, cushion, seat, etc.) INTERVENTIONS - Wound Cleansing / Measurement X - Simple Wound Cleansing - one wound 1 5 []  - 0 Complex Wound Cleansing - multiple wounds X- 1 5 Wound Imaging (photographs - any number of wounds) []  - 0 Wound Tracing (instead of photographs) X- 1 5 Simple Wound Measurement - one wound []  - 0 Complex Wound Measurement - multiple wounds INTERVENTIONS - Wound Dressings X - Small Wound Dressing one or multiple wounds 1 10 []  - 0 Medium Wound Dressing one or multiple wounds []  - 0 Large Wound Dressing one or multiple wounds []  - 0 Application of Medications - topical []  - 0 Application of Medications - injection INTERVENTIONS - Miscellaneous []  - 0 External ear exam []  - 0 Specimen Collection (cultures, biopsies, blood, body fluids, etc.) []  - 0 Specimen(s) / Culture(s) sent or taken to Lab for analysis []  - 0 Patient Transfer (multiple staff / Nurse, adult / Similar devices) []  - 0 Simple Staple / Suture removal (25 or less) []  - 0 Complex Staple / Suture removal (26 or more) []  - 0 Hypo / Hyperglycemic Management (close monitor of Blood Glucose) []  - 0 Ankle / Brachial Index (ABI) - do not check if billed separately X- 1 5 Vital Signs Has the patient been seen at the hospital within the last three years: Yes Total Score: 75 Level Of Care: New/Established - Level 2 Electronic Signature(s) Signed: 12/13/2022 9:26:52 AM By: Yevonne Pax RN Entered By: Yevonne Pax on 12/11/2022 14:44:16 Fleer, Smrithi V ([id number]) [id number] Page 3 of 8 -------------------------------------------------------------------------------- Encounter Discharge Information Details Patient Name: Date of Service: [name], [name] 12/11/2022 2:00 PM Medical Record Number: [id number] Patient Account Number: [id number] Date of Birth/Sex: Treating RN: 1941-06-30 (82 y.o. Female) Yevonne Pax Primary Care Tahjai Schetter: Beverely Low Other Clinician: Referring Raysa Bosak: Treating Taisei Bonnette/Extender: RO BSO N, MICHA EL Vena [place] in Treatment: 1 Encounter Discharge Information Items Discharge Condition: Stable Ambulatory Status: Ambulatory Discharge Destination: Home Transportation: Private Auto Accompanied By: husband Schedule Follow-up Appointment: Yes Clinical Summary of Care: Electronic Signature(s) Signed: 12/13/2022 9:26:52 AM By: Yevonne Pax RN Entered By: Yevonne Pax on 12/11/2022 14:44:59 -------------------------------------------------------------------------------- Lower Extremity Assessment Details Patient Name: Date of Service: MODELLE, MCARTHUR 12/11/2022 2:00 PM Medical Record Number: [id number] Patient Account Number: [id number] Date of Birth/Sex: Treating RN: January 09, 1941 (82 y.o. Female) Yevonne Pax Primary Care Savanna Dooley: Beverely Low Other Clinician: Referring Clotilde Loth: Treating Daxx Tiggs/Extender: RO BSO N, MICHA EL Lorrin Goodell Weeks in Treatment: 1 Edema Assessment Assessed: [Left: No] [Right: No] Edema: [Left: Ye] [Right: s] Calf Left: Right: Point of Measurement: 30 cm From Medial Instep 28 cm Ankle Left: Right: Point of Measurement: 10 cm From Medial Instep 20.4 cm Knee To Floor Left: Right: From Medial Instep 40 cm Vascular Assessment Pulses: Dorsalis Pedis Palpable: [Right:Yes] Electronic Signature(s) Signed: 12/13/2022 9:26:52 AM By: Yevonne Pax RN Entered By: Yevonne Pax on 12/11/2022 14:38:52 -------------------------------------------------------------------------------- Multi Wound Chart Details Patient Name: Date of Service: Windle Guard. 12/11/2022 2:00 PM Wootan, Felisa Bonier ([id number]) [id number] Page 4 of 8 Medical Record Number:  [id number] Patient Account Number: [id number] Date of Birth/Sex: Treating RN: 09-07-1940 (82 y.o. 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: Extremity Cataracts, Chronic sinus N/A N/A Comorbid History: problems/congestion, Congestive Heart Failure, Hypertension, Type II Diabetes 11/04/2022 N/A N/A Date Acquired: 1 N/A N/A Weeks of Treatment: Open N/A N/A Wound Status: No N/A N/A Wound Recurrence: Yes N/A N/A Pending A mputation on Presentation: 1x0.7x0.5 N/A N/A Measurements L x W x D (cm) 0.55 N/A N/A A (cm) : rea 0.275 N/A N/A Volume (cm) : -192.60% N/A N/A % Reduction in A rea: -192.60% N/A N/A % Reduction in Volume: Grade 2 N/A N/A Classification: Medium N/A N/A Exudate A mount: Serosanguineous N/A N/A Exudate Type: red, brown N/A N/A Exudate Color: Small (1-33%) N/A N/A Granulation A mount: Pink N/A N/A Granulation Quality: Large (67-100%) N/A N/A Necrotic A mount: Fat Layer (Subcutaneous Tissue): Yes N/A N/A Exposed Structures: Bone: Yes Fascia: No Tendon: No Muscle: No Joint: No None N/A N/A Epithelialization: Treatment Notes Electronic Signature(s) Signed: 12/13/2022 9:26:52 AM By: Yevonne Pax RN Entered By: Yevonne Pax on 12/11/2022 14:38:56 -------------------------------------------------------------------------------- Multi-Disciplinary Care Plan Details Patient Name: Date of Service: Windle Guard. 12/11/2022 2:00 PM Medical Record Number:  [id number] Patient Account Number: [id number] Date of Birth/Sex: Treating RN: [birth year] (82 y.o. Female) Yevonne Pax Primary Care Eriel Doyon: Beverely Low Other Clinician: Referring Tangela Dolliver: Treating Fletcher Ostermiller/Extender: Chauncey Mann, MICHA EL Shea Evans, Elena Weeks in Treatment: 1 Bucio, Jackson V ([id number]) [id number] Page 5 of 8 Active Inactive Necrotic Tissue Nursing Diagnoses: Knowledge deficit related to management of necrotic/devitalized tissue Goals: Necrotic/devitalized tissue will be minimized in the wound bed Date Initiated: 12/04/2022 Target Resolution Date: 01/04/2023 Goal Status: Active Interventions: Assess patient pain level pre-, during and post procedure and prior to discharge Provide education on necrotic tissue and debridement process Notes: Nutrition Nursing Diagnoses: Potential for alteratiion in Nutrition/Potential for imbalanced nutrition Goals: Patient/caregiver verbalizes understanding of need to maintain therapeutic glucose control per primary care physician Date Initiated: 12/04/2022 Target Resolution Date: 01/04/2023 Goal Status: Active Interventions: Assess HgA1c results as ordered upon admission and as needed Assess patient nutrition upon admission and as needed per policy Notes: Wound/Skin Impairment Nursing Diagnoses: Knowledge deficit related to ulceration/compromised skin integrity Goals: Patient/caregiver will verbalize understanding of skin care regimen Date Initiated: 12/04/2022 Target Resolution Date: 01/04/2023 Goal Status: Active Ulcer/skin breakdown will have a volume reduction of 30% by week 4 Date Initiated: 12/04/2022 Target Resolution Date: 01/04/2023 Goal Status: Active Ulcer/skin breakdown will have a volume reduction of 50% by week 8 Date Initiated: 12/04/2022 Target Resolution Date: 02/03/2023 Goal Status: Active Ulcer/skin breakdown will have a volume reduction of 80% by week 12 Date Initiated: 12/04/2022 Target  Resolution Date: 03/06/2023 Goal Status: Active Ulcer/skin breakdown will heal within 14 weeks Date Initiated: 12/04/2022 Target Resolution Date: 04/06/2023 Goal Status: Active Interventions: Assess patient/caregiver ability to obtain necessary supplies Assess patient/caregiver ability to perform ulcer/skin care regimen upon admission and as needed Assess ulceration(s) every visit Notes: Electronic Signature(s) Signed: 12/13/2022 9:26:52 AM By: Yevonne Pax RN Entered By: Yevonne Pax on 12/11/2022 14:39:06 -------------------------------------------------------------------------------- Pain Assessment Details Patient Name: Date of Service: [name], [name] 12/11/2022 2:00 PM Medical Record Number: [id number] Patient Account Number: [id number] Date of Birth/Sex: Treating RN: 1941/04/11 (82 y.o. Female) Alonnie, Trueman V ([id number]) [id number] Page 6 of 8 Primary Care Nadea Kirkland: Beverely Low Other Clinician: Referring Kinsie Belford: Treating Rozanna Cormany/Extender: RO BSO N, MICHA EL Lorrin Goodell Weeks in Treatment:  1 Active Problems Location of Pain Severity and Description of Pain Patient Has Paino No Site Locations Pain Management and Medication Current Pain Management: Electronic Signature(s) Signed: 12/13/2022 9:26:52 AM By: Yevonne Pax RN Entered By: Yevonne Pax on 12/11/2022 14:35:28 -------------------------------------------------------------------------------- Patient/Caregiver Education Details Patient Name: Date of Service: Windle Guard 5/8/2024andnbsp2:00 PM Medical Record Number: [id number] Patient Account Number: [id number] Date of Birth/Gender: Treating RN: 08-30-1940 (82 y.o. Female) Yevonne Pax Primary Care Physician: Beverely Low Other Clinician: Referring Physician: Treating Physician/Extender: RO BSO Dorris Carnes, MICHA EL Vena [place] in Treatment: 1 Education Assessment Education Provided To: Patient Education Topics  Provided Infection: Handouts: Hygiene and Infection Prevention Methods: Explain/Verbal Responses: State content correctly Electronic Signature(s) Signed: 12/13/2022 9:26:52 AM By: Yevonne Pax RN Entered By: Yevonne Pax on 12/11/2022 14:39:25 -------------------------------------------------------------------------------- Wound Assessment Details Patient Name: Date of Service: [name], [name] 12/11/2022 2:00 PM Medical Record Number: [id number] Patient Account Number: [id number] Date of Birth/Sex: Treating RN: [birth year] (82 y.o. Female) Yevonne Pax Primary Care Spero Gunnels: Beverely Low Other Clinician: LIANNE, BAILIN ([id number]) [id number] Page 7 of 8 Referring Reya Aurich: Treating Dionne Rossa/Extender: RO BSO N, MICHA EL Shea Evans, Elena Weeks in Treatment: 1 Wound Status Wound Number: 12 Primary Diabetic Wound/Ulcer of the Lower Extremity Etiology: Wound Location: Right, Distal, Dorsal Foot Wound Open Wounding Event: Gradually Appeared Status: Date Acquired: 11/04/2022 Comorbid Cataracts, Chronic sinus problems/congestion, Congestive Heart Weeks Of Treatment: 1 History: Failure, Hypertension, Type II Diabetes Clustered Wound: No Pending Amputation On Presentation Photos Wound Measurements Length: (cm) 1 Width: (cm) 0.7 Depth: (cm) 0.5 Area: (cm) 0.55 Volume: (cm) 0.275 % Reduction in Area: -192.6% % Reduction in Volume: -192.6% Epithelialization: None Tunneling: No Undermining: No Wound Description Classification: Grade 2 Exudate Amount: Medium Exudate Type: Serosanguineous Exudate Color: red, brown Foul Odor After Cleansing: No Slough/Fibrino Yes Wound Bed Granulation Amount: Small (1-33%) Exposed Structure Granulation Quality: Pink Fascia Exposed: No Necrotic Amount: Large (67-100%) Fat Layer (Subcutaneous Tissue) Exposed: Yes Necrotic Quality: Adherent Slough Tendon Exposed: No Muscle Exposed: No Joint Exposed: No Bone Exposed:  Yes Treatment Notes Wound #12 (Foot) Wound Laterality: Dorsal, Right, Distal Cleanser Soap and Water Discharge Instruction: Gently cleanse wound with antibacterial soap, rinse and pat dry prior to dressing wounds Peri-Wound Care Topical Primary Dressing Gauze Discharge Instruction: moistened with Dakins Solution Secondary Dressing Gauze Secured With Medipore T - 63M Medipore H Soft Cloth Surgical T ape ape, 2x2 (in/yd) Compression Wrap Compression Stockings Add-Ons Zell, Kaylor V ([id number]) [id number] Page 8 of 8 Electronic Signature(s) Signed: 12/13/2022 9:26:52 AM By: Yevonne Pax RN Entered By: Yevonne Pax on 12/11/2022 14:38:27 -------------------------------------------------------------------------------- Vitals Details Patient Name: Date of Service: Windle Guard. 12/11/2022 2:00 PM Medical Record Number: [id number] Patient Account Number: [id number] Date of Birth/Sex: Treating RN: 1941/03/25 (82 y.o. Female) Yevonne Pax Primary Care [place] Graefe: Beverely Low Other Clinician: Referring Alfreddie Consalvo: Treating Emie Sommerfeld/Extender: RO BSO N, MICHA EL Lorrin Goodell Weeks in Treatment: 1 Vital Signs Time Taken: 14:24 Temperature (F): 98.2 Height (in): 64 Pulse (bpm): 74 Weight (lbs): 145 Respiratory Rate (breaths/min): 18 Body Mass Index (BMI): 24.9 Blood Pressure (mmHg): 139/79 Reference Range: 80 - 120 mg / dl Electronic Signature(s) Signed: 12/13/2022 9:26:52 AM By: Yevonne Pax RN Entered By: Yevonne Pax on 12/11/2022 14:35:15

## 2022-12-18 ENCOUNTER — Other Ambulatory Visit: Payer: Self-pay | Admitting: Internal Medicine

## 2022-12-18 ENCOUNTER — Encounter (HOSPITAL_BASED_OUTPATIENT_CLINIC_OR_DEPARTMENT_OTHER): Payer: Medicare HMO | Admitting: Internal Medicine

## 2022-12-18 DIAGNOSIS — E11621 Type 2 diabetes mellitus with foot ulcer: Secondary | ICD-10-CM

## 2022-12-18 DIAGNOSIS — L97514 Non-pressure chronic ulcer of other part of right foot with necrosis of bone: Secondary | ICD-10-CM

## 2022-12-18 DIAGNOSIS — Z794 Long term (current) use of insulin: Secondary | ICD-10-CM

## 2022-12-18 DIAGNOSIS — I739 Peripheral vascular disease, unspecified: Secondary | ICD-10-CM

## 2022-12-26 ENCOUNTER — Other Ambulatory Visit (INDEPENDENT_AMBULATORY_CARE_PROVIDER_SITE_OTHER): Payer: Self-pay | Admitting: Nurse Practitioner

## 2022-12-26 DIAGNOSIS — Z9889 Other specified postprocedural states: Secondary | ICD-10-CM

## 2022-12-27 ENCOUNTER — Ambulatory Visit
Admission: RE | Admit: 2022-12-27 | Discharge: 2022-12-27 | Disposition: A | Discharge status: Home / Self Care | Payer: Medicare HMO | Source: Ambulatory Visit | Attending: Internal Medicine | Admitting: Internal Medicine

## 2022-12-27 DIAGNOSIS — L97509 Non-pressure chronic ulcer of other part of unspecified foot with unspecified severity: Secondary | ICD-10-CM | POA: Diagnosis present

## 2022-12-27 DIAGNOSIS — E11621 Type 2 diabetes mellitus with foot ulcer: Secondary | ICD-10-CM

## 2022-12-27 DIAGNOSIS — L97514 Non-pressure chronic ulcer of other part of right foot with necrosis of bone: Secondary | ICD-10-CM | POA: Diagnosis present

## 2023-01-01 ENCOUNTER — Ambulatory Visit (INDEPENDENT_AMBULATORY_CARE_PROVIDER_SITE_OTHER): Payer: Medicare HMO | Admitting: Nurse Practitioner

## 2023-01-01 ENCOUNTER — Ambulatory Visit (INDEPENDENT_AMBULATORY_CARE_PROVIDER_SITE_OTHER): Payer: Medicare HMO

## 2023-01-01 ENCOUNTER — Encounter (INDEPENDENT_AMBULATORY_CARE_PROVIDER_SITE_OTHER): Payer: Self-pay | Admitting: Nurse Practitioner

## 2023-01-01 ENCOUNTER — Ambulatory Visit: Payer: Medicare HMO | Admitting: Internal Medicine

## 2023-01-01 VITALS — BP 122/75 | HR 73 | Resp 18 | Ht 69.0 in | Wt 145.0 lb

## 2023-01-01 DIAGNOSIS — I739 Peripheral vascular disease, unspecified: Secondary | ICD-10-CM | POA: Diagnosis not present

## 2023-01-01 DIAGNOSIS — Z9889 Other specified postprocedural states: Secondary | ICD-10-CM | POA: Diagnosis not present

## 2023-01-01 DIAGNOSIS — I7025 Atherosclerosis of native arteries of other extremities with ulceration: Secondary | ICD-10-CM | POA: Diagnosis not present

## 2023-01-01 DIAGNOSIS — I10 Essential (primary) hypertension: Secondary | ICD-10-CM | POA: Diagnosis not present

## 2023-01-01 DIAGNOSIS — Z794 Long term (current) use of insulin: Secondary | ICD-10-CM

## 2023-01-01 DIAGNOSIS — E1159 Type 2 diabetes mellitus with other circulatory complications: Secondary | ICD-10-CM | POA: Diagnosis not present

## 2023-01-02 ENCOUNTER — Encounter (INDEPENDENT_AMBULATORY_CARE_PROVIDER_SITE_OTHER): Payer: Self-pay | Admitting: Nurse Practitioner

## 2023-01-02 LAB — VAS US ABI WITH/WO TBI
Left ABI: 1.09
Right ABI: 0.85

## 2023-01-02 NOTE — Progress Notes (Signed)
Subjective:    Patient ID: [name], female    DOB: 30-Mar-1941, 82 y.o.   MRN: [id number] Chief Complaint  Patient presents with   Follow-up    f/u in 3 months with ab    The patient returns to the office for followup and review of the noninvasive studies.  The patient has underwent multiple lower extremity revascularizations over the last several years of the bilateral lower extremities  The patient notes that there has been a significant deterioration in the lower extremity symptoms.  Since her last visit she has developed discoloration to the tip of the right great toe as well as a wound extending down to the bone.  The patient's husband notes that she recently underwent an MRI to determine if there is a possible bone infection.    There have been no significant changes to the patient's overall health care.  The patient denies amaurosis fugax or recent TIA symptoms. There are no recent neurological changes noted. There is no history of DVT, PE or superficial thrombophlebitis. The patient denies recent episodes of angina or shortness of breath.   ABI's Rt=0.85 and Lt=1.09 (previous ABI's Rt=1.3 and Lt=1.09) Duplex US of the lower extremity arterial system shows monophasic tibial waveforms, in the right lower extremity anterior tibial waveforms are severely dampened    Review of Systems  Skin:  Positive for wound.  Neurological:  Positive for weakness.  All other systems reviewed and are negative.      Objective:   Physical Exam Vitals reviewed.  HENT:     Head: Normocephalic.  Cardiovascular:     Rate and Rhythm: Normal rate.     Pulses:          Dorsalis pedis pulses are detected w/ Doppler on the left side.       Posterior tibial pulses are detected w/ Doppler on the left side.  Pulmonary:     Effort: Pulmonary effort is normal.  Neurological:     Mental Status: She is alert.     Motor: Weakness present.  Psychiatric:        Attention and Perception: Attention  normal.        Mood and Affect: Mood normal. Affect is flat.        Speech: Speech normal.        Cognition and Memory: Cognition is impaired.        Judgment: Judgment is impulsive.     BP 122/75 (BP Location: Right Arm)   Pulse 73   Resp 18   Ht 5\' 9"  (1.753 m)   Wt 145 lb (65.8 kg)   BMI 21.41 kg/m   Past Medical History:  Diagnosis Date   Acute heart failure (HCC)    Aortic stenosis    CHF (congestive heart failure) (HCC)    Complication of anesthesia    Hard to wake patient up after having anesthesia   Diabetes mellitus without complication (HCC)    Diabetic neuropathy (HCC)    Takes Lyrica   Edema of both legs    Takes Lasix   GERD (gastroesophageal reflux disease)    High cholesterol    HTN (hypertension)    Hypertension    PAD (peripheral artery disease) (HCC)    Shortness of breath dyspnea    Spasm of back muscles    Stroke (HCC)    Wound, open    Right great toe    Social History   Socioeconomic History   Marital status: Married  Spouse name: Not on file   Number of children: Not on file   Years of education: Not on file   Highest education level: Not on file  Occupational History   Not on file  Tobacco Use   Smoking status: Never   Smokeless tobacco: Never   Tobacco comments:    Never smoked  Substance and Sexual Activity   Alcohol use: No    Alcohol/week: 0.0 standard drinks of alcohol   Drug use: No   Sexual activity: Never  Other Topics Concern   Not on file  Social History Narrative   Not on file   Social Determinants of Health   Financial Resource Strain: Not on file  Food Insecurity: Not on file  Transportation Needs: Not on file  Physical Activity: Not on file  Stress: Not on file  Social Connections: Not on file  Intimate Partner Violence: Not on file    Past Surgical History:  Procedure Laterality Date   AMPUTATION TOE Left 06/09/2019   Procedure: Left third toe and partial great toe amputation and debridement;   Surgeon: Renford Dills, MD;  Location: [facility];  Service: Vascular;  Laterality: Left;   AMPUTATION TOE Left 04/06/2020   Procedure: AMPUTATION LEFT SECOND TOE;  Surgeon: Gwyneth Revels, DPM;  Location: [facility];  Service: Podiatry;  Laterality: Left;   BYPASS GRAFT POPLITEAL TO TIBIAL Right 02/28/2016   Procedure: BYPASS GRAFT RIGHT BELOW KNEE POPLITEAL TO PERONEAL USING REVERSED RIGHT GREATER SAPHENOUS VEIN;  Surgeon: Fransisco Hertz, MD;  Location: [facility];  Service: Vascular;  Laterality: Right;   CYST EXCISION     Abdomen   EYE SURGERY Bilateral    Cataract removal   INTRAMEDULLARY (IM) NAIL INTERTROCHANTERIC Left 02/04/2014   Procedure: INTRAMEDULLARY (IM) NAIL INTERTROCHANTRIC FEMORAL;  Surgeon: Shelda Pal, MD;  Location: [facility];  Service: Orthopedics;  Laterality: Left;   IR GENERIC HISTORICAL  03/01/2016   IR ANGIO INTRA EXTRACRAN SEL COM CAROTID INNOMINATE UNI R MOD SED 03/01/2016 Julieanne Cotton, MD [facility]   IR GENERIC HISTORICAL  03/01/2016   IR ENDOVASC INTRACRANIAL INF OTHER THAN THROMBO ART INC DIAG ANGIO 03/01/2016 Julieanne Cotton, MD [facility]   IR GENERIC HISTORICAL  03/01/2016   IR INTRAVSC STENT CERV CAROTID W/O EMB-PROT MOD SED INC ANGIO 03/01/2016 Julieanne Cotton, MD [facility]   IR GENERIC HISTORICAL  06/03/2016   IR RADIOLOGIST EVAL & MGMT 06/03/2016 [facility]   LOWER EXTREMITY ANGIOGRAPHY Left 02/09/2019   Procedure: LOWER EXTREMITY ANGIOGRAPHY;  Surgeon: Renford Dills, MD;  Location: [facility];  Service: Cardiovascular;  Laterality: Left;   LOWER EXTREMITY ANGIOGRAPHY Left 03/10/2019   Procedure: LOWER EXTREMITY ANGIOGRAPHY;  Surgeon: Renford Dills, MD;  Location: [facility];  Service: Cardiovascular;  Laterality: Left;   LOWER EXTREMITY ANGIOGRAPHY Left 04/27/2019   Procedure: LOWER EXTREMITY ANGIOGRAPHY;  Surgeon: Renford Dills, MD;  Location: [facility];  Service: Cardiovascular;  Laterality: Left;   LOWER  EXTREMITY ANGIOGRAPHY Left 06/08/2019   Procedure: Lower Extremity Angiography;  Surgeon: Renford Dills, MD;  Location: [facility];  Service: Cardiovascular;  Laterality: Left;   LOWER EXTREMITY ANGIOGRAPHY Left 04/03/2020   Procedure: Lower Extremity Angiography;  Surgeon: Annice Needy, MD;  Location: [facility];  Service: Cardiovascular;  Laterality: Left;   LOWER EXTREMITY ANGIOGRAPHY Right 10/20/2020   Procedure: Lower Extremity Angiography;  Surgeon: Renford Dills, MD;  Location: [facility];  Service: Cardiovascular;  Laterality: Right;   LOWER EXTREMITY ANGIOGRAPHY Left 01/23/2021   Procedure: LOWER EXTREMITY ANGIOGRAPHY;  Surgeon: Renford Dills, MD;  Location: [facility];  Service: Cardiovascular;  Laterality: Left;   LOWER EXTREMITY ANGIOGRAPHY Right 04/10/2021   Procedure: LOWER EXTREMITY ANGIOGRAPHY;  Surgeon: Renford Dills, MD;  Location: [facility];  Service: Cardiovascular;  Laterality: Right;   ORIF TOE FRACTURE Right 02/08/2014   Procedure: OPEN REDUCTION INTERNAL FIXATION Right METATARSAL  FRACTURE ;  Surgeon: Toni Arthurs, MD;  Location: [facility];  Service: Orthopedics;  Laterality: Right;   PERIPHERAL VASCULAR CATHETERIZATION N/A 12/28/2015   Procedure: Abdominal Aortogram w/Lower Extremity;  Surgeon: Fransisco Hertz, MD;  Location: [facility];  Service: Cardiovascular;  Laterality: N/A;   RADIOLOGY WITH ANESTHESIA N/A 03/01/2016   Procedure: RADIOLOGY WITH ANESTHESIA;  Surgeon: Julieanne Cotton, MD;  Location: [facility];  Service: Radiology;  Laterality: N/A;   VEIN HARVEST Right 02/28/2016   Procedure: RIGHT GREATER SAPHENOUS VEIN HARVEST;  Surgeon: Fransisco Hertz, MD;  Location: [facility];  Service: Vascular;  Laterality: Right;    Family History  Problem Relation Age of Onset   Diabetes Other    Liver disease Mother    CVA Father    Diabetes Father    Hypertension Father     Allergies  Allergen Reactions    Egg-Derived Products Shortness Of Breath   Iodine Anaphylaxis   Penicillins Anaphylaxis    Tolerates ceftriaxone, cefazolin Did it involve swelling of the face/tongue/throat, SOB, or low BP? Unknown Did it involve sudden or severe rash/hives, skin peeling, or any reaction on the inside of your mouth or nose? Unknown Did you need to seek medical attention at a hospital or doctor's office? Unknown When did it last happen?      Unknown If all above answers are "NO", may proceed with cephalosporin use.    Shellfish Allergy Anaphylaxis   Sulfa Antibiotics Anaphylaxis   Sulfacetamide Sodium Anaphylaxis   Sulfasalazine Anaphylaxis   Morphine And Codeine Other (See Comments)    Altered mental status       Latest Ref Rng & Units 11/04/2021    6:01 PM 06/05/2021   12:53 PM 03/10/2021    6:29 AM  CBC  WBC 4.0 - 10.5 K/uL 14.6  9.9  4.3   Hemoglobin 12.0 - 15.0 g/dL 95.2  84.1  9.8   Hematocrit 36.0 - 46.0 % 40.2  33.8  31.4   Platelets 150 - 400 K/uL 210  261  148       CMP     Component Value Date/Time   NA 139 11/04/2021 1801   K 4.6 11/04/2021 1801   CL 105 11/04/2021 1801   CO2 25 11/04/2021 1801   GLUCOSE 188 (H) 11/04/2021 1801   BUN 14 11/04/2021 1801   CREATININE 0.58 11/04/2021 1801   CALCIUM 9.4 11/04/2021 1801   PROT 6.9 11/04/2021 1801   ALBUMIN 3.5 11/04/2021 1801   AST 22 11/04/2021 1801   ALT 22 11/04/2021 1801   ALKPHOS 72 11/04/2021 1801   BILITOT 0.5 11/04/2021 1801   GFRNONAA >60 11/04/2021 1801   GFRAA >60 04/06/2020 0535     No results found.     Assessment & Plan:   1. Atherosclerosis of native arteries of the extremities with ulceration (HCC) Very significant discussion with the patient has been.  The husband largely carries decision-making capabilities.  We discussed that the patient's ABIs, while not significantly  reduced show that there is some diminished perfusion in the anterior tibial artery which is necessary for healing wound near the toes  and plantar surface of the foot.  I discussed that without angiogram the patient faces a significant risk of possible amputation.  If the patient is found to have osteomyelitis, revealed by her most recent MRI that has not been read yet, wound healing will become much more difficult and there may be a partial amputation that is needed.  However in this current setting I am highly skeptical that she has adequate enough perfusion for wound healing if additional amputations were necessary.  I strongly advised the patient to move forward with angiogram however he was not willing to move forward at this time.  He wishes to discuss with his children as well as to wait for his upcoming wound provider visit.  Patient notes that he would contact us if he wishes to move forward.  2. Primary hypertension Continue antihypertensive medications as already ordered, these medications have been reviewed and there are no changes at this time.  3. Type 2 diabetes mellitus with other circulatory complication, with long-term current use of insulin (HCC) Continue hypoglycemic medications as already ordered, these medications have been reviewed and there are no changes at this time.  Hgb A1C to be monitored as already arranged by primary service   Current Outpatient Medications on File Prior to Visit  Medication Sig Dispense Refill   acetaminophen (TYLENOL) 500 MG tablet Take 1,000 mg by mouth every 6 (six) hours as needed for mild pain.     amLODipine (NORVASC) 10 MG tablet Take 10 mg by mouth daily.     aspirin EC 81 MG EC tablet Take 1 tablet (81 mg total) by mouth daily.     atorvastatin (LIPITOR) 20 MG tablet Take 1 tablet (20 mg total) by mouth daily. (Patient taking differently: Take 20 mg by mouth at bedtime.) 30 tablet 11   budesonide (PULMICORT) 0.25 MG/2ML nebulizer solution Take 2 mLs (0.25 mg total) by nebulization 2 (two) times daily. 60 mL 12   carbamide peroxide (DEBROX) 6.5 % OTIC solution Place 5 drops  into both ears 2 (two) times daily as needed (ear wax).     Carboxymethylcellul-Glycerin (LUBRICATING EYE DROPS OP) Place 1 drop into both eyes daily as needed (dry eyes).     carvedilol (COREG) 6.25 MG tablet Take 6.25 mg by mouth 2 (two) times daily with a meal.     cefdinir (OMNICEF) 300 MG capsule Take 1 capsule (300 mg total) by mouth 2 (two) times daily. 10 capsule 0   cetirizine (ZYRTEC) 10 MG tablet Take 10 mg by mouth daily.     cholecalciferol (VITAMIN D3) 25 MCG (1000 UNIT) tablet Take 1,000 Units by mouth daily.     clopidogrel (PLAVIX) 75 MG tablet Take 1 tablet (75 mg total) by mouth daily with breakfast. 30 tablet 0   diclofenac Sodium (VOLTAREN) 1 % GEL Apply 1 application topically 4 (four) times daily as needed (pain).     docusate sodium (COLACE) 100 MG capsule Take 100 mg by mouth daily.     doxycycline (VIBRA-TABS) 100 MG tablet Take 100 mg by mouth 2 (two) times daily.     fluticasone (FLONASE) 50 MCG/ACT nasal spray Place 1 spray into both nostrils daily as needed for allergies.      gabapentin (NEURONTIN) 300 MG capsule Take 300 mg by mouth at bedtime.     hydrALAZINE (APRESOLINE) 25 MG tablet Take  1 tablet (25 mg total) by mouth every 8 (eight) hours. 60 tablet 0   insulin glargine (LANTUS) 100 UNIT/ML injection Inject 0.1 mLs (10 Units total) into the skin at bedtime. (Patient taking differently: Inject 18 Units into the skin at bedtime.)     magnesium hydroxide (MILK OF MAGNESIA) 400 MG/5ML suspension Take 15 mLs by mouth daily as needed for mild constipation.     magnesium oxide (MAG-OX) 400 MG tablet Take 400 mg by mouth 2 (two) times daily.      modafinil (PROVIGIL) 200 MG tablet Take 200 mg by mouth daily.     NYAMYC powder Apply 1 application topically daily.     omeprazole (PRILOSEC) 20 MG capsule Take 1 capsule (20 mg total) by mouth daily.     polyethylene glycol (MIRALAX / GLYCOLAX) packet Take 17 g by mouth 2 (two) times daily. (Patient taking differently: Take  17 g by mouth daily as needed for moderate constipation.) 14 each 0   potassium chloride SA (K-DUR,KLOR-CON) 20 MEQ tablet Take 20 mEq by mouth daily.     torsemide (DEMADEX) 20 MG tablet Take 1 tablet (20 mg total) by mouth 2 (two) times daily.     Vitamin E 180 MG CAPS Take 180 mg by mouth daily.     No current facility-administered medications on file prior to visit.    There are no Patient Instructions on file for this visit. No follow-ups on file.   Georgiana Spinner, NP

## 2023-01-02 NOTE — H&P (View-Only) (Signed)
 Subjective:    Patient ID: [name], female    DOB: 09/11/1940, 82 y.o.   MRN: [id number] Chief Complaint  Patient presents with   Follow-up    f/u in 3 months with ab    The patient returns to the office for followup and review of the noninvasive studies.  The patient has underwent multiple lower extremity revascularizations over the last several years of the bilateral lower extremities  The patient notes that there has been a significant deterioration in the lower extremity symptoms.  Since her last visit she has developed discoloration to the tip of the right great toe as well as a wound extending down to the bone.  The patient's husband notes that she recently underwent an MRI to determine if there is a possible bone infection.    There have been no significant changes to the patient's overall health care.  The patient denies amaurosis fugax or recent TIA symptoms. There are no recent neurological changes noted. There is no history of DVT, PE or superficial thrombophlebitis. The patient denies recent episodes of angina or shortness of breath.   ABI's Rt=0.85 and Lt=1.09 (previous ABI's Rt=1.3 and Lt=1.09) Duplex US of the lower extremity arterial system shows monophasic tibial waveforms, in the right lower extremity anterior tibial waveforms are severely dampened    Review of Systems  Skin:  Positive for wound.  Neurological:  Positive for weakness.  All other systems reviewed and are negative.      Objective:   Physical Exam Vitals reviewed.  HENT:     Head: Normocephalic.  Cardiovascular:     Rate and Rhythm: Normal rate.     Pulses:          Dorsalis pedis pulses are detected w/ Doppler on the left side.       Posterior tibial pulses are detected w/ Doppler on the left side.  Pulmonary:     Effort: Pulmonary effort is normal.  Neurological:     Mental Status: She is alert.     Motor: Weakness present.  Psychiatric:        Attention and Perception: Attention  normal.        Mood and Affect: Mood normal. Affect is flat.        Speech: Speech normal.        Cognition and Memory: Cognition is impaired.        Judgment: Judgment is impulsive.     BP 122/75 (BP Location: Right Arm)   Pulse 73   Resp 18   Ht 5' 9" (1.753 m)   Wt 145 lb (65.8 kg)   BMI 21.41 kg/m   Past Medical History:  Diagnosis Date   Acute heart failure (HCC)    Aortic stenosis    CHF (congestive heart failure) (HCC)    Complication of anesthesia    Hard to wake patient up after having anesthesia   Diabetes mellitus without complication (HCC)    Diabetic neuropathy (HCC)    Takes Lyrica   Edema of both legs    Takes Lasix   GERD (gastroesophageal reflux disease)    High cholesterol    HTN (hypertension)    Hypertension    PAD (peripheral artery disease) (HCC)    Shortness of breath dyspnea    Spasm of back muscles    Stroke (HCC)    Wound, open    Right great toe    Social History   Socioeconomic History   Marital status: Married      Spouse name: Not on file   Number of children: Not on file   Years of education: Not on file   Highest education level: Not on file  Occupational History   Not on file  Tobacco Use   Smoking status: Never   Smokeless tobacco: Never   Tobacco comments:    Never smoked  Substance and Sexual Activity   Alcohol use: No    Alcohol/week: 0.0 standard drinks of alcohol   Drug use: No   Sexual activity: Never  Other Topics Concern   Not on file  Social History Narrative   Not on file   Social Determinants of Health   Financial Resource Strain: Not on file  Food Insecurity: Not on file  Transportation Needs: Not on file  Physical Activity: Not on file  Stress: Not on file  Social Connections: Not on file  Intimate Partner Violence: Not on file    Past Surgical History:  Procedure Laterality Date   AMPUTATION TOE Left 06/09/2019   Procedure: Left third toe and partial great toe amputation and debridement;   Surgeon: Schnier, Gregory G, MD;  Location: [facility];  Service: Vascular;  Laterality: Left;   AMPUTATION TOE Left 04/06/2020   Procedure: AMPUTATION LEFT SECOND TOE;  Surgeon: Fowler, Justin, DPM;  Location: [facility];  Service: Podiatry;  Laterality: Left;   BYPASS GRAFT POPLITEAL TO TIBIAL Right 02/28/2016   Procedure: BYPASS GRAFT RIGHT BELOW KNEE POPLITEAL TO PERONEAL USING REVERSED RIGHT GREATER SAPHENOUS VEIN;  Surgeon: Brian L Chen, MD;  Location: [facility];  Service: Vascular;  Laterality: Right;   CYST EXCISION     Abdomen   EYE SURGERY Bilateral    Cataract removal   INTRAMEDULLARY (IM) NAIL INTERTROCHANTERIC Left 02/04/2014   Procedure: INTRAMEDULLARY (IM) NAIL INTERTROCHANTRIC FEMORAL;  Surgeon: Matthew D Olin, MD;  Location: [facility];  Service: Orthopedics;  Laterality: Left;   IR GENERIC HISTORICAL  03/01/2016   IR ANGIO INTRA EXTRACRAN SEL COM CAROTID INNOMINATE UNI R MOD SED 03/01/2016 Sanjeev Deveshwar, MD [facility]   IR GENERIC HISTORICAL  03/01/2016   IR ENDOVASC INTRACRANIAL INF OTHER THAN THROMBO ART INC DIAG ANGIO 03/01/2016 Sanjeev Deveshwar, MD [facility]   IR GENERIC HISTORICAL  03/01/2016   IR INTRAVSC STENT CERV CAROTID W/O EMB-PROT MOD SED INC ANGIO 03/01/2016 Sanjeev Deveshwar, MD [facility]   IR GENERIC HISTORICAL  06/03/2016   IR RADIOLOGIST EVAL & MGMT 06/03/2016 [facility]   LOWER EXTREMITY ANGIOGRAPHY Left 02/09/2019   Procedure: LOWER EXTREMITY ANGIOGRAPHY;  Surgeon: Schnier, Gregory G, MD;  Location: [facility];  Service: Cardiovascular;  Laterality: Left;   LOWER EXTREMITY ANGIOGRAPHY Left 03/10/2019   Procedure: LOWER EXTREMITY ANGIOGRAPHY;  Surgeon: Schnier, Gregory G, MD;  Location: [facility];  Service: Cardiovascular;  Laterality: Left;   LOWER EXTREMITY ANGIOGRAPHY Left 04/27/2019   Procedure: LOWER EXTREMITY ANGIOGRAPHY;  Surgeon: Schnier, Gregory G, MD;  Location: [facility];  Service: Cardiovascular;  Laterality: Left;   LOWER  EXTREMITY ANGIOGRAPHY Left 06/08/2019   Procedure: Lower Extremity Angiography;  Surgeon: Schnier, Gregory G, MD;  Location: [facility];  Service: Cardiovascular;  Laterality: Left;   LOWER EXTREMITY ANGIOGRAPHY Left 04/03/2020   Procedure: Lower Extremity Angiography;  Surgeon: Dew, Jason S, MD;  Location: [facility];  Service: Cardiovascular;  Laterality: Left;   LOWER EXTREMITY ANGIOGRAPHY Right 10/20/2020   Procedure: Lower Extremity Angiography;  Surgeon: Schnier, Gregory G, MD;  Location: [facility];    Service: Cardiovascular;  Laterality: Right;   LOWER EXTREMITY ANGIOGRAPHY Left 01/23/2021   Procedure: LOWER EXTREMITY ANGIOGRAPHY;  Surgeon: Schnier, Gregory G, MD;  Location: [facility];  Service: Cardiovascular;  Laterality: Left;   LOWER EXTREMITY ANGIOGRAPHY Right 04/10/2021   Procedure: LOWER EXTREMITY ANGIOGRAPHY;  Surgeon: Schnier, Gregory G, MD;  Location: [facility];  Service: Cardiovascular;  Laterality: Right;   ORIF TOE FRACTURE Right 02/08/2014   Procedure: OPEN REDUCTION INTERNAL FIXATION Right METATARSAL  FRACTURE ;  Surgeon: John Hewitt, MD;  Location: [facility];  Service: Orthopedics;  Laterality: Right;   PERIPHERAL VASCULAR CATHETERIZATION N/A 12/28/2015   Procedure: Abdominal Aortogram w/Lower Extremity;  Surgeon: Brian L Chen, MD;  Location: [facility];  Service: Cardiovascular;  Laterality: N/A;   RADIOLOGY WITH ANESTHESIA N/A 03/01/2016   Procedure: RADIOLOGY WITH ANESTHESIA;  Surgeon: Sanjeev Deveshwar, MD;  Location: [facility];  Service: Radiology;  Laterality: N/A;   VEIN HARVEST Right 02/28/2016   Procedure: RIGHT GREATER SAPHENOUS VEIN HARVEST;  Surgeon: Brian L Chen, MD;  Location: [facility];  Service: Vascular;  Laterality: Right;    Family History  Problem Relation Age of Onset   Diabetes Other    Liver disease Mother    CVA Father    Diabetes Father    Hypertension Father     Allergies  Allergen Reactions    Egg-Derived Products Shortness Of Breath   Iodine Anaphylaxis   Penicillins Anaphylaxis    Tolerates ceftriaxone, cefazolin Did it involve swelling of the face/tongue/throat, SOB, or low BP? Unknown Did it involve sudden or severe rash/hives, skin peeling, or any reaction on the inside of your mouth or nose? Unknown Did you need to seek medical attention at a hospital or doctor's office? Unknown When did it last happen?      Unknown If all above answers are "NO", may proceed with cephalosporin use.    Shellfish Allergy Anaphylaxis   Sulfa Antibiotics Anaphylaxis   Sulfacetamide Sodium Anaphylaxis   Sulfasalazine Anaphylaxis   Morphine And Codeine Other (See Comments)    Altered mental status       Latest Ref Rng & Units 11/04/2021    6:01 PM 06/05/2021   12:53 PM 03/10/2021    6:29 AM  CBC  WBC 4.0 - 10.5 K/uL 14.6  9.9  4.3   Hemoglobin 12.0 - 15.0 g/dL 12.1  10.7  9.8   Hematocrit 36.0 - 46.0 % 40.2  33.8  31.4   Platelets 150 - 400 K/uL 210  261  148       CMP     Component Value Date/Time   NA 139 11/04/2021 1801   K 4.6 11/04/2021 1801   CL 105 11/04/2021 1801   CO2 25 11/04/2021 1801   GLUCOSE 188 (H) 11/04/2021 1801   BUN 14 11/04/2021 1801   CREATININE 0.58 11/04/2021 1801   CALCIUM 9.4 11/04/2021 1801   PROT 6.9 11/04/2021 1801   ALBUMIN 3.5 11/04/2021 1801   AST 22 11/04/2021 1801   ALT 22 11/04/2021 1801   ALKPHOS 72 11/04/2021 1801   BILITOT 0.5 11/04/2021 1801   GFRNONAA >60 11/04/2021 1801   GFRAA >60 04/06/2020 0535     No results found.     Assessment & Plan:   1. Atherosclerosis of native arteries of the extremities with ulceration (HCC) Very significant discussion with the patient has been.  The husband largely carries decision-making capabilities.  We discussed that the patient's ABIs, while not significantly   reduced show that there is some diminished perfusion in the anterior tibial artery which is necessary for healing wound near the toes  and plantar surface of the foot.  I discussed that without angiogram the patient faces a significant risk of possible amputation.  If the patient is found to have osteomyelitis, revealed by her most recent MRI that has not been read yet, wound healing will become much more difficult and there may be a partial amputation that is needed.  However in this current setting I am highly skeptical that she has adequate enough perfusion for wound healing if additional amputations were necessary.  I strongly advised the patient to move forward with angiogram however he was not willing to move forward at this time.  He wishes to discuss with his children as well as to wait for his upcoming wound provider visit.  Patient notes that he would contact us if he wishes to move forward.  2. Primary hypertension Continue antihypertensive medications as already ordered, these medications have been reviewed and there are no changes at this time.  3. Type 2 diabetes mellitus with other circulatory complication, with long-term current use of insulin (HCC) Continue hypoglycemic medications as already ordered, these medications have been reviewed and there are no changes at this time.  Hgb A1C to be monitored as already arranged by primary service   Current Outpatient Medications on File Prior to Visit  Medication Sig Dispense Refill   acetaminophen (TYLENOL) 500 MG tablet Take 1,000 mg by mouth every 6 (six) hours as needed for mild pain.     amLODipine (NORVASC) 10 MG tablet Take 10 mg by mouth daily.     aspirin EC 81 MG EC tablet Take 1 tablet (81 mg total) by mouth daily.     atorvastatin (LIPITOR) 20 MG tablet Take 1 tablet (20 mg total) by mouth daily. (Patient taking differently: Take 20 mg by mouth at bedtime.) 30 tablet 11   budesonide (PULMICORT) 0.25 MG/2ML nebulizer solution Take 2 mLs (0.25 mg total) by nebulization 2 (two) times daily. 60 mL 12   carbamide peroxide (DEBROX) 6.5 % OTIC solution Place 5 drops  into both ears 2 (two) times daily as needed (ear wax).     Carboxymethylcellul-Glycerin (LUBRICATING EYE DROPS OP) Place 1 drop into both eyes daily as needed (dry eyes).     carvedilol (COREG) 6.25 MG tablet Take 6.25 mg by mouth 2 (two) times daily with a meal.     cefdinir (OMNICEF) 300 MG capsule Take 1 capsule (300 mg total) by mouth 2 (two) times daily. 10 capsule 0   cetirizine (ZYRTEC) 10 MG tablet Take 10 mg by mouth daily.     cholecalciferol (VITAMIN D3) 25 MCG (1000 UNIT) tablet Take 1,000 Units by mouth daily.     clopidogrel (PLAVIX) 75 MG tablet Take 1 tablet (75 mg total) by mouth daily with breakfast. 30 tablet 0   diclofenac Sodium (VOLTAREN) 1 % GEL Apply 1 application topically 4 (four) times daily as needed (pain).     docusate sodium (COLACE) 100 MG capsule Take 100 mg by mouth daily.     doxycycline (VIBRA-TABS) 100 MG tablet Take 100 mg by mouth 2 (two) times daily.     fluticasone (FLONASE) 50 MCG/ACT nasal spray Place 1 spray into both nostrils daily as needed for allergies.      gabapentin (NEURONTIN) 300 MG capsule Take 300 mg by mouth at bedtime.     hydrALAZINE (APRESOLINE) 25 MG tablet Take   1 tablet (25 mg total) by mouth every 8 (eight) hours. 60 tablet 0   insulin glargine (LANTUS) 100 UNIT/ML injection Inject 0.1 mLs (10 Units total) into the skin at bedtime. (Patient taking differently: Inject 18 Units into the skin at bedtime.)     magnesium hydroxide (MILK OF MAGNESIA) 400 MG/5ML suspension Take 15 mLs by mouth daily as needed for mild constipation.     magnesium oxide (MAG-OX) 400 MG tablet Take 400 mg by mouth 2 (two) times daily.      modafinil (PROVIGIL) 200 MG tablet Take 200 mg by mouth daily.     NYAMYC powder Apply 1 application topically daily.     omeprazole (PRILOSEC) 20 MG capsule Take 1 capsule (20 mg total) by mouth daily.     polyethylene glycol (MIRALAX / GLYCOLAX) packet Take 17 g by mouth 2 (two) times daily. (Patient taking differently: Take  17 g by mouth daily as needed for moderate constipation.) 14 each 0   potassium chloride SA (K-DUR,KLOR-CON) 20 MEQ tablet Take 20 mEq by mouth daily.     torsemide (DEMADEX) 20 MG tablet Take 1 tablet (20 mg total) by mouth 2 (two) times daily.     Vitamin E 180 MG CAPS Take 180 mg by mouth daily.     No current facility-administered medications on file prior to visit.    There are no Patient Instructions on file for this visit. No follow-ups on file.   Grey Schlauch E Jaren Vanetten, NP   

## 2023-01-08 ENCOUNTER — Encounter: Payer: Medicare HMO | Attending: Internal Medicine | Admitting: Internal Medicine

## 2023-01-08 DIAGNOSIS — I70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 of foot: Secondary | ICD-10-CM | POA: Diagnosis not present

## 2023-01-08 DIAGNOSIS — Z794 Long term (current) use of insulin: Secondary | ICD-10-CM | POA: Diagnosis not present

## 2023-01-08 DIAGNOSIS — L97514 Non-pressure chronic ulcer of other part of right foot with necrosis of bone: Secondary | ICD-10-CM | POA: Diagnosis not present

## 2023-01-08 DIAGNOSIS — E11621 Type 2 diabetes mellitus with foot ulcer: Secondary | ICD-10-CM | POA: Insufficient documentation

## 2023-01-08 DIAGNOSIS — G4730 Sleep apnea, unspecified: Secondary | ICD-10-CM | POA: Diagnosis not present

## 2023-01-08 DIAGNOSIS — I5032 Chronic diastolic (congestive) heart failure: Secondary | ICD-10-CM | POA: Diagnosis not present

## 2023-01-08 DIAGNOSIS — J449 Chronic obstructive pulmonary disease, unspecified: Secondary | ICD-10-CM | POA: Diagnosis not present

## 2023-01-08 DIAGNOSIS — Z9981 Dependence on supplemental oxygen: Secondary | ICD-10-CM | POA: Insufficient documentation

## 2023-01-08 DIAGNOSIS — I110 Hypertensive heart disease with heart failure: Secondary | ICD-10-CM | POA: Insufficient documentation

## 2023-01-15 ENCOUNTER — Encounter (HOSPITAL_BASED_OUTPATIENT_CLINIC_OR_DEPARTMENT_OTHER): Payer: Medicare HMO | Admitting: Internal Medicine

## 2023-01-15 DIAGNOSIS — L97514 Non-pressure chronic ulcer of other part of right foot with necrosis of bone: Secondary | ICD-10-CM

## 2023-01-15 DIAGNOSIS — M86671 Other chronic osteomyelitis, right ankle and foot: Secondary | ICD-10-CM | POA: Diagnosis not present

## 2023-01-15 DIAGNOSIS — E11621 Type 2 diabetes mellitus with foot ulcer: Secondary | ICD-10-CM | POA: Diagnosis not present

## 2023-01-15 DIAGNOSIS — I739 Peripheral vascular disease, unspecified: Secondary | ICD-10-CM

## 2023-01-16 ENCOUNTER — Telehealth (INDEPENDENT_AMBULATORY_CARE_PROVIDER_SITE_OTHER): Payer: Self-pay

## 2023-01-16 NOTE — Progress Notes (Signed)
[name], [name] ([id number]) [id number] Page 1 of 10 Visit Report for 01/15/2023 Chief Complaint Document Details Patient Name: Date of Service: [name], [name] 01/15/2023 2:30 PM Medical Record Number: [id number] Patient Account Number: [id number] Date of Birth/Sex: Treating RN: 1941/01/06 (82 y.o. [name] Primary Care Provider: Beverely Low Other Clinician: Betha Loa Referring Provider: Treating Provider/Extender: Glendon Axe Weeks in Treatment: 6 Information Obtained from: Patient Chief Complaint 12/04/2022; right dorsal foot wound Electronic Signature(s) Signed: 01/15/2023 4:14:13 PM By: Geralyn Corwin DO Entered By: Geralyn Corwin on 01/15/2023 15:03:26 -------------------------------------------------------------------------------- HPI Details Patient Name: Date of Service: [name] 01/15/2023 2:30 PM Medical Record Number: [id number] Patient Account Number: [id number] Date of Birth/Sex: Treating RN: Aug 17, 1940 (82 y.o. [name] Primary Care Provider: Beverely Low Other Clinician: Betha Loa Referring Provider: Treating Provider/Extender: Osa Craver in Treatment: 6 History of Present Illness HPI Description: 08/13/16: This is a 82 year old woman who came predominantly for review of 3 cm in diameter circular wound to the left anterior lateral leg. She was in the ER on 08/01/16 I reviewed their notes. There was apparently pus coming out of the wound at that time and the patient arrived requesting debridement which they don't do in the emergency room. Nevertheless I can't see that they did any x-rays. There were no cultures done. She is a type II diabetic and I a note after the patient was in the clinic that she had a bypass graft from the popliteal to the tibial on the right on 02/28/16. She also had a right greater saphenous vein harvest on the same date for arterial bypass. She is going  to have vascular studies including ABIs T ABIs on the right on 08/28/16. The patient's surgery was on 02/28/16 by Dr. Johny Drilling she had a right below the knee popliteal artery to peroneal artery bypass with reverse greater saphenous vein and an endarterectomy of the mid segment peroneal artery. Postoperatively she had a strong mild monophasic peroneal signal with a pink foot. It would appear that the patient is had some nonhealing in the surgical saphenous vein harvest site on the left leg. Surprisingly looking through [facility] I cannot see much information about this at all. Dr. Ruthe Mannan notes from 05/29/16 show that the patient's wounds "are not healed" the right first metatarsal wound healed but then opened back up. 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 At that point the patient had a wound VAC to her right calf with regards to the wounds on her dorsal right toe would appear that these are felt to be arterial wounds. She has had surgery on the metatarsal phalangeal in 2015 I Dr. Victorino Dike secondary to a right metatarsal phalangeal joint fracture. She is apparently had discoloration around this area since then. 08/28/16; patient arrives with her wounds in much the same condition. The linear vein harvest site and the circular wound below it which I think was a blister. She also has to probing holes in her right great toe and a necrotic eschar on the right second toe. Because of these being arterial wounds I reduced her compression from 3-2 layers this seems to of done satisfactorily she has not had any problems. I cannot see that she is actually had an x-ray ====== 11/06/16 the patient comes in for evaluation of her right lower extremity ulcers. She was here in January 2018 for 2 visits  subsequently ended up in the hospital with pneumonia and then to rehabilitation. She has now been discharged from  rehabilitation and is home. She has multiple ulcerations to her right lower extremity including the foot and toes. She does have home health in place and they have been placing alginate to the ulcers. She is followed by Dr. Imogene Burn of vascular medicine. 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 She recently saw him on 3/23. In office ABIs were: Right 0.48 with monophasic flow to the DP, PT peroneal , Left 0.63 with monophasic flow to the DP and PT Her arterial studies indicated a patent right below knee popliteal to peroneal bypass She had an MRI in February 2008 that was negative frosty myelitis but this showed general soft tissue edema in the right foot and lower extremity concerning for cellulitis She is a diabetic, managed with insulin. Her hemoglobin A1c in December 2017 was 8.4 which is a trend up from previous levels. She had blood work in February 2018 which revealed an albumin of 2.6 this appears to be relatively acute as an albumin in November 2017 was 3.7 11/13/16; this is a patient I have not seen since February who is readmitted to our clinic last week. 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 Imogene Burn. I have reviewed Dr. Nicky Pugh notes from March/23/18. Doppler ABI on that date showed an ABI on the right of 0.48 and on the left of 0.63. Dorsalis pedis waveforms were monophasic bilaterally. There was no waveforms detected at the posterior tibial on the right, monophasic on the left. Dr. Nicky Pugh comments were that this patient would have follow-up vascular studies in 3 months including ABIs and right lower extremity arterial duplex. She had an MRI in February that was negative for osteomyelitis but showed generalized edema in the foot. Last albumin I see was in January at 3.4 we have been using Santyl to the 4 wounds on the right leg the patient is noted  today to have widespread edema well up towards her groin this is pitting 2-3+. 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 Normal ejection fraction. 11/20/16; patient has a follow-up appointment with Dr. Imogene Burn on April 23. She is still complaining of a lot of pain in the right foot and right leg. It is not clear to me that this is at all positional however I think it is clear claudication with minimal activity perhaps at rest. At our suggestion she is return to her primary physician's office tomorrow with regards to her pitting lower extremity bilateral edema that I reviewed in detail last week 11/27/16; the follow-up with Dr. Imogene Burn was actually on May 23 on April 23 as I stated in my note last week. N/A case all of her wounds seems somewhat smaller. The 2 on the right leg are definitely smaller. The areas on the dorsal right first toe, right third toe and the lateral part of the right fifth metatarsal head all looks smaller but have tightly adherent surfaces. We have been using Santyl 12/04/16; follow-up with Dr. Imogene Burn on May 23. 2 small open wounds on the right leg continue to get smaller. The area on the right third total lateral aspect of the right fifth toe also look better. The remaining area  on the dorsal first toe still has some depth to it. We have been using Santyl to the toes and collagen on the right 12/11/16; according to patient's husband the follow-up with Dr. Imogene Burn is not until July. All of the wounds on the right leg are measuring smaller. We have been using some combination of Prisma and Santyl although I think we can go to straight Prisma today. There may have been some confusion with home health about the primary dressing orders here. 12/25/16; the patient has had some healing this week. The area on her right lateral fifth metatarsal head, right third toe  are both healed lower right leg is healed. In the vein harvest site superiorly she has one superficial open area. On the dorsal aspect of her right great toe/MTP joint the wound is now divided into 2 however the proximal area is deep and there is palpable bone 01/01/17; still an open area in the middle of her original right surgical scar. The area on the right third toe and right lateral fifth metatarsal head remained closed. Problematic area on the dorsal aspect of the toe. Previous surgery in this area line 01/08/17 small open area on the original scar on her upper anterior leg although this is closing. X-ray I did of the right first toe did not show underlying bony abnormality. Still this area on the dorsal first toe probes to bone. We have been using Prisma 01/15/17; small open area in the original scar in the upper anterior leg is almost fully closed. She has 2 open areas over the dorsal aspect of the right first toe that probes to bone. Used and a form starting last week. Vigorous bone scraping that I did last week showed few methicillin sensitive staph aureus. She is allergic to penicillin and sulfa drugs. I'm going to give her 2 weeks of doxycycline. She will need an MRI with contrast. 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 She continues on doxycycline for a bone scraping that showed methicillin sensitive staph aureus [allergic to penicillin and sulfa]. We have been using Endo form to the wound. 01/29/17; surprisingly her MRI did not show osteomyelitis. She continues on doxycycline for a bone scraping that showed methicillin sensitive staph aureus with allergies to penicillin and sulfa. We have been using Endo form to the wound. Unfortunately I cannot get a surface on this visit looks like it is able to support a healing state. Proximally there is still exposed bone. There is no overt soft tissue tenderness. Her MRI  did show a previous fracture was surgery to this area but no hardware 02/12/17 on evaluation today patient appears to be doing well in regard to her lower extremity wound. She does have some mild discomfort but this is minimal. There's no evidence of infection. Her left great toe nail has somewhat been lifted up and there is a little bit of slight bleeding underneath but this is still firmly attached. 02/19/17; she ran out of doxycycline 2 days ago. Nevertheless I would like to continue this for 2 weeks to make a full 6 weeks of therapy as the bone scraping that I did from the open area on the dorsal right first toe showed MRSA. This should complete antibiotic therapy. 02/26/17; the patient is completing her 6 weeks of oral doxycycline. Deep wound on the dorsal right toe. This still has some exposed bone proximally. She does have a  reasonably granulated surface albeit thin over bone. I've been using Endo form have made application for an amniotic skin sub 03/05/17; the patient has completed 6 weeks of doxycycline. This is a deep wound on the dorsal right toe which has exposed bone proximally. She has granulation over most of this wound albeit a thin layer. I've been using Endo form. Still do not have approval for Affinity 03/13/17 on evaluation today patient's right foot wound appears to be doing better measurement wise compared to her last evaluation. She has been tolerating the dressing change without complication and does have a appointment with the dietary that has been made as far as referral is concerned there just waiting for her and transportation to contact them back for an actual date. Nonetheless patient has been having less pain at this point. No fevers, chills, nausea, or vomiting noted at this time. 03/26/17; linear wound over the dorsal aspect of her right great toe. At one point this had exposed bone on the most superior aspect however I am pleased to see today that this appears to have a  surface of granulation. Apparently product was applied for but denied by [facility]. We'll need to see what that was. The patient has known PAD status post revascularization. MRI did not show osteomyelitis which was done in June 04/02/17; continued improvement using Endoform. She was approved for Oasis but hasn't over $200 co-pay per application, this is beyond her means 04/09/17; continues on Endoform. 04/16/17; appears to be doing nicely continuing on Endoform 04/22/17; patient did not have her dressing changed all week because of the weather. Using Endoform. Base of the wound looks healthy elbow there appears to events surrounding maceration perhaps because of the drainage was not changing the wound dressing 04/30/17 wound today continues to close and except for a small divot on the superior part of the wound. This area did not probe the bone but I found this a little concerning as this was the area with exposed bone before we be able to get this to granulate forward. As I remember things she is not a candidate for skin substitutes secondary to an outlandish co-pay. I asked her husband to look into the out of pocket max for medications if he has 1 [Regranex] or totally for skin substitutes 05/07/17; the patient arrives today with a wound roughly the same size although under careful inspection under the light there appears to be some epithelialization. Her intake nurse noted that the Endoform seemed to be placed over the wound rather than in the deeper divots they could really benefit from the Endoform. At this point I have no plans to change the Endoform as at one point this was at least 33% exposed bone and the rest of the wound very close to the underlying phalanx 05/14/17; no major change in the size or appearance of the wound. We have been using Endoform for a prolonged period of time with reasonably good improvement in the epithelialization i.e. no exposed bone especially proximally. Unless we've not  made a lot of changes in the last several weeks. She does not complain of pain. She was revascularized early this year or perhaps late last year by Dr. Imogene Burn and I wonder if he will need to have a look at her, I will need to review the actual vascular procedure which I think was a distal popliteal bypass 05/21/17; switched to either for a blue last week. Patient arrived in clinic with the wound not measuring any better but the  surface looking some better. Unfortunately she had some surface slough and when I went to remove this superiorly she had exposed bone. Previously she had had exposed bone in the inferior part of the wound however this is granulated over some weeks ago. Clearly this is a major step back for her. With some difficulty I was able to obtain a piece of the bone probing through the superior part of the wound for culture. We did not send pathology. It is likely that she is going to need imaging of this site but I did not order this today 05/28/17; bone culture I took last week showed MSSA. She still has exposed bone however I could not get another piece for pathology. She had an MRI 4 months ago that did not show osteoarthritic at time. Wound rapidly deteriorated superiorly and I suspect this is underlying osteomyelitis. We have been using Hydrofera Blue 06/04/17 on evaluation today patient's wound appears to actually be doing a little bit better visually compared to last week's evaluation which is good news. Nonetheless she does have osteomyelitis of the toe which does not appear to be MRSA. No fevers, chills, nausea, or vomiting noted at this time. She is having no discomfort. 06/11/17; patient's wound looks a little healthier than I remember seeing it 2 weeks ago. There is no exposed bone and the surface of the wound appears well granulated. We've been using hydrofera blue. I note that she was started on doxycycline which was MSSA. Her appointment with Dr. Sampson Goon is on November 12  I believe. 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 Johny Drilling of vascular surgery on 11/9. He offered right leg angiography possible intervention however the patient refused. I've discussed this with the patient's husband today she did not want any further procedures. Also noteworthy that Dr. Johny Drilling stated that this wound had not healed in 3 years, I was not aware of this degree of chronicity in this area. She has underlying osteomyelitis by bone culture. Culture of this grew MSSA, she is allergic to penicillin and therefore not a candidate for beta lactams. She saw Dr. Sampson Goon of infectious disease this morning, he continued her on doxycycline for another month and apparently sent in a prescription. We have been using Hydrofera Blue. ABI's update by Dr. Imogene Burn right 0.62, left 0.89. Monophasic wave forms 06/25/17 on evaluation today patient appears to be doing well in regard to her right great toe wound. She is still taking the doxycycline as prescribed by Dr. Sampson Goon. The Hydrofera Blue Dressing's to be doing as well as anything has for her up to this point and we are still waiting on an insurance authorization for the Apligraft. She is having no pain which is good news. No fevers, chills, nausea, or vomiting noted at this time. 07/02/17; still taking doxycycline as prescribed by Dr. Sampson Goon. Hydrofera Blue continues. We have no palpable bone. Still have not had had approval of Apligraf [name], [name] ([id number]) [id number] Page 3 of 10 08/06/16; the patient arrives today with [facility] even though we apparently had changed to Sorbact last time. Her co-pay for Regranex is $389 in discussion with the patient and her husband this was excessive. We'll continue with the Sorbact and standard dressings for now 08/20/17; we have been using sorbact. The wound bed still requires debridement. Wound measurements are slightly better. 09/03/17;using  Sorbact.no debridement today. Wound measurements slightly better. There is some discoloration of the tip of her toelooks like bruising 09/17/17; using Sorbact. No  debridement today. Wound dimensions are about the same. Still some discoloration at the tip of her great toe. This does not look any worse than last time 10/01/17; using sorbact right great toe I thought she might have surface epithelialization last time although it is certainly not look like that today nevertheless her dimensions are better. Continued surface eschar on the tip of her toe but this is not progressing. She is actually complaining of the left great toe 10/15/17; this is a very difficult area on the dorsal aspect of her proximal right great toe. At one point this wound had exposed bone however we have managed to get some degree of epithelialization. She was revascularized by Dr. Johny Drilling in [place]. She saw Dr. Sampson Goon for underlying osteomyelitis. She is not a candidate for advanced treatment options [insurance issues]. She comes in today with a new area on the tip of her right great toe. The exact history here is unclear. We have been using sorbact 10/29/17; patient arrives today with very significant bilateral increase edema which appears to extend into the thighs. This is tight and not particularly pitting however it looks a lot more impressive than I remember. Not surprisingly the wounds on her right great toe have increased in size with weeping edema fluid as the edema extends into the dorsal foot itself. She also has a wound on the tip of her right great toe which is a new wound from 2 weeks ago. We have been using sorbact. 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 He is on Demadex presumably twice a day at 20 mg. I have asked her family to make her an appointment with her primary physicians at the [place] clinic to go over this degree of  edema. This is causing I think deterioration in the right great toe wound. She also has significant PAD and is status post right leg bypass graft. I think this was done by [facility]. I believe they also said that there was nothing further that could be done with regards to her vascular status. 11/12/17; patient is going to see her primary doctor this afternoon with regards to lower extremity edema. She has 2 open wounds on the right great toe the original wound on the dorsal proximal part and then a more necrotic looking area on the tip of her right great toe. We took some time today to review her vascular status. The patient had a relate below knee popliteal to peroneal artery bypass with reversed greater saphenous vein on 06/13/17; she also had endarterectomy of the midsegment of the peroneal artery. 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 I think at some point in time after that she was offered an arteriogram on the right but she declined. I've used Silver collagen to her wounds recently. 11/26/17; patient has 2 open wounds on the right great toe tip as well as the dorsal aspect of her right great toe. She has known PAD. I've been trying to get her back to see vein and vascular in [facility] to see if there are options for endovascular surgery to help with perfusion although the patient and her husband may not want any more surgery. We've been using silver alginate because of maceration. She does not have an option for advanced treatment/skin substitutes 12/10/17; continued open wounds much the  same as 2 weeks ago on the dorsal aspect of the right great toe on the right great toe tip. I've encouraged to follow- up with Dr. Imogene Burn of vein and vascular to explore the possibility that there may be correctable ischemia involved in nonhealing these areas.  We made considerable progress on the dorsal right great toe however it is stalled recently. We do not have an advanced treatment option. Originally this had exposed bone however there is a granulated base. 12/24/17; patient went back to see vein and vascular in [place]. She was seen by Langston Reusing NP. ABIs on the right were noted to be 0.84 and TBI of the right and 0.62. On the left her ABI was 0.82 and her TBI of 0.75. Waveforms were monophasic at the posterior tibial and dorsalis pedis bilaterally. 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 Her husband reiterates today that she does not want an arteriogram therefore I'm not going to press the issue. We've been using silver collagen to the wounds on the tip of the right great toe and dorsal surface of the right great toe. 01/07/18; patient arrives today with the tip of her toe healed. An cerebral amount of slough over the wound on the dorsal toe. As noted on 12/24/17 no options currently for revascularization the patient will not allow an arteriogram 01/21/18; typical of her right great toe remains healed. Still large amount of slough over the wound on the dorsal toe. No options for revascularization. We've been using Medihoney 02/04/18; no major change. Still a punched out area on the right great toe dorsal wound. Covered in a difficult necrotic debris. 02/18/18; no major change. Still a punched out area over the right great toe. They're using medihoney 03/04/18; no major change. The wound is stalled. Still a punched out area over the right dorsal great toe. We've been using medihone for about a month not much change in the wound dimensions or the characteristics of the wound surface. We've previously used Iodoflex, Hydrofera Blue, sorbact, and I think at one point Savage.  She has PAD and does not have any revascularization options although a wound distally on the tip of the great toe did close over 03/25/18; absolutely no change. Still adherent debris over the wound surface there is no palpable bone but the wound still has some depth. This is predominantly a arterial wound and she basically has refused further attempts at revascularization. I would consider this a totally palliative situation except for she managed to heal the wound at the tip of the same toe some months ago. I changed her back to Iodoflex The other issue is that [facility] has called and wants to pare back on the visits which have been 3 times a week. They will drop to 2 times a week next week 04/29/18; since the patient was last here she was admitted to hospital from 9/6 through 9/13. She was noted to have altered LOC, declining ability to walk. 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 She was discharged to [facility]. She is on oxygen chronically at 3 L. I don't think they've been doing anything specific to the wound. Her husband laments that she is no longer  able to stand and walk or transfer. Yet he seems determined to take her home 05/13/18; 2 week follow-up. The patient is still at [facility]. She arrives lethargic in clinic. She was also confused during her index hospitalization. She was felt to have metabolic encephalopathy secondary to UTI. Although she also had hypercapnic respiratory failure secondary to COPD and sleep apnea. She is on chronic oxygen. Paradoxically her wound actually looks a lot better and this is really filled in nicely. We're using silver collagen 06/03/18 3 week follow-up. The [facility] is on [facility] but was supposed to be discharged home today. She arrives in clinic virtually unresponsive. Her  wound is actually healed 12/04/2022 [name] Silliman is an 82 year old female with a past medical history of insulin-dependent type 2 diabetes, peripheral arterial disease and COPD that presents the clinic for a right foot wound. She states this happened spontaneously about 4 weeks ago. Unfortunately there is bone exposed today. She has been keeping the area covered. She denies systemic signs of infection. 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 She follows with vein and vascular for peripheral arterial disease. On 04/2021 she had a right lower extremity arteriogram with stent placement in the right superficial femoral and popliteal arteries and right anterior tibial artery. She last saw them in February 2024. At that time she did not have wounds. 5/8; this is a patient is been in the clinic at variable times through the years. She has known type 2 diabetes with severe PAD. She was followed for a period of time with Dr. Gilda Crease. She has a wound on the dorsal aspect of the right foot just proximal to the metatarsal head. This probes easily to bone. Culture last week showed methicillin sensitive Staph aureus. This was shown to to [employer] and doxycycline was ordered. The patient is allergic to penicillin. She also had an x-ray done but I do not have these results. For reasons that are not totally clear the doxycycline has not started I made it clear to her husband that needed to start this tonight. The patient has a deep probing wound to bone. She has hardware in this area as well. This goes back to 2015 when she had a metatarsal fracture repaired by Dr. Deborha Payment, [place] ([id number]) [id number] Page 4 of 10 5/15; patient presents for follow-up. Son is present during the encounter. She has been taking doxycycline for the past 7 days. X-ray did not show radiographic evidence of osteomyelitis however she does have obvious bone exposed. Will order an  MRI. She has been using Vashe wet-to-dry dressings. Currently denies systemic signs of infection. Discussed potentially doing hyperbaric oxygen therapy. Patient and son will think about it. 6/1. Since the patient was last here she had an MRI on 5/24 and noninvasive arterial studies on 5/29. The arterial study showed a ABI of 0.85 on the right with monophasic waveforms unfortunately they did not do TBI's on the right because of the wound in this location. On the left ABI was 1.09 with monophasic waveforms her toe on the left is amputated. MRI of the right foot showed underlying marrow edema suspicious for early osteomyelitis. We have been using Vashe wet-to-dry to the wounds which her husband is changing 6/12; patient Presents for follow-up. She saw vein and vascular on 5/29 and they recommended an angiogram however patient and husband declined During that visit. Patient's husband is present today. He states that he has been waiting for  a phone call from vein and vascular that is why he has not followed up. She denies systemic signs of infection. She has been using Vashe wet-to-dry dressings. Electronic Signature(s) Signed: 01/15/2023 4:14:13 PM By: Geralyn Corwin DO Entered By: Geralyn Corwin on 01/15/2023 15:06:24 -------------------------------------------------------------------------------- Physical Exam Details Patient Name: Date of Service: [name], [name] 01/15/2023 2:30 PM Medical Record Number: [id number] Patient Account Number: [id number] Date of Birth/Sex: Treating RN: [birth year] (82 y.o. [name] Primary Care Provider: Beverely Low Other Clinician: Betha Loa Referring Provider: Treating Provider/Extender: Glendon Axe Weeks in Treatment: 6 Constitutional . Psychiatric . Notes Open wound to the dorsal aspect of the first metatarsal on the right foot with nonviable tissue and exposed bone. Difficult to palpate pedal pulses Electronic  Signature(s) Signed: 01/15/2023 4:14:13 PM By: Geralyn Corwin DO Entered By: Geralyn Corwin on 01/15/2023 15:08:27 -------------------------------------------------------------------------------- Physician Orders Details Patient Name: Date of Service: [name] 01/15/2023 2:30 PM Medical Record Number: [id number] Patient Account Number: [id number] Date of Birth/Sex: Treating RN: 09-28-1940 (82 y.o. Freddy Finner Primary Care Provider: Beverely Low Other Clinician: Betha Loa Referring Provider: Treating Provider/Extender: Osa Craver in Treatment: 6 Verbal / Phone Orders: No Diagnosis Coding ICD-10 Coding Code Description L97.514 Non-pressure chronic ulcer of other part of right foot with necrosis of bone E11.621 Type 2 diabetes mellitus with foot ulcer I73.9 Peripheral vascular disease, unspecified J44.9 Chronic obstructive pulmonary disease, unspecified Z79.4 Long term (current) use of insulin Follow-up Appointments Return Appointment in 1 week. Bathing/ Shower/ Hygiene May shower with wound dressing protected with water repellent cover or cast protector. [name], [name] ([id number]) [id number] Page 5 of 10 Anesthetic (Use 'Patient Medications' Section for Anesthetic Order Entry) Lidocaine applied to wound bed Edema Control - Lymphedema / Segmental Compressive Device / Other Elevate, Exercise Daily and A void Standing for Long Periods of Time. Elevate legs to the level of the heart and pump ankles as often as possible Elevate leg(s) parallel to the floor when sitting. Wound Treatment Wound #12 - Foot Wound Laterality: Dorsal, Right, Distal Cleanser: Soap and Water 1 x Per Day/30 Days Discharge Instructions: Gently cleanse wound with antibacterial soap, rinse and pat dry prior to dressing wounds Prim Dressing: Gauze 1 x Per Day/30 Days ary Discharge Instructions: moistened with Dakins Solution Secondary  Dressing: Gauze 1 x Per Day/30 Days Secured With: Medipore T - 42M Medipore H Soft Cloth Surgical T ape ape, 2x2 (in/yd) 1 x Per Day/30 Days Electronic Signature(s) Signed: 01/15/2023 4:14:13 PM By: Geralyn Corwin DO Entered By: Geralyn Corwin on 01/15/2023 15:15:55 -------------------------------------------------------------------------------- Problem List Details Patient Name: Date of Service: [name] 01/15/2023 2:30 PM Medical Record Number: [id number] Patient Account Number: [id number] Date of Birth/Sex: Treating RN: 1940/09/08 (82 y.o. [name] Primary Care Provider: Beverely Low Other Clinician: Betha Loa Referring Provider: Treating Provider/Extender: Osa Craver in Treatment: 6 Active Problems ICD-10 Encounter Code Description Active Date MDM Diagnosis L97.514 Non-pressure chronic ulcer of other part of right foot with necrosis of bone 12/04/2022 No Yes E11.621 Type 2 diabetes mellitus with foot ulcer 12/04/2022 No Yes I73.9 Peripheral vascular disease, unspecified 12/04/2022 No Yes M86.671 Other chronic osteomyelitis, right ankle and foot 01/15/2023 No Yes J44.9 Chronic obstructive pulmonary disease, unspecified 12/04/2022 No Yes Z79.4 Long term (current) use of insulin 12/04/2022 No Yes Inactive Problems Resolved Problems Electronic Signature(s) AANVI, YANCE V ([id number]) [id number] Page 6 of 10 Signed: 01/15/2023 4:14:13 PM By: Geralyn Corwin DO  Entered By: Geralyn Corwin on 01/15/2023 15:14:11 -------------------------------------------------------------------------------- Progress Note Details Patient Name: Date of Service: [name], [name] 01/15/2023 2:30 PM Medical Record Number: [id number] Patient Account Number: [id number] Date of Birth/Sex: Treating RN: [birth year] (82 y.o. [name] Primary Care Provider: Beverely Low Other Clinician: Betha Loa Referring Provider: Treating  Provider/Extender: Osa Craver in Treatment: 6 Subjective Chief Complaint Information obtained from Patient 12/04/2022; right dorsal foot wound History of Present Illness (HPI) 08/13/16: This is a 82 year old woman who came predominantly for review of 3 cm in diameter circular wound to the left anterior lateral leg. She was in the ER on 08/01/16 I reviewed their notes. There was apparently pus coming out of the wound at that time and the patient arrived requesting debridement which they don't do in the emergency room. Nevertheless I can't see that they did any x-rays. There were no cultures done. She is a type II diabetic and I a note after the patient was in the clinic that she had a bypass graft from the popliteal to the tibial on the right on 02/28/16. She also had a right greater saphenous vein harvest on the same date for arterial bypass. She is going to have vascular studies including ABIs T ABIs on the right on 08/28/16. The patient's surgery was on 02/28/16 by Dr. Johny Drilling she had a right below the knee popliteal artery to peroneal artery bypass with reverse greater saphenous vein and an endarterectomy of the mid segment peroneal artery. Postoperatively she had a strong mild monophasic peroneal signal with a pink foot. It would appear that the patient is had some nonhealing in the surgical saphenous vein harvest site on the left leg. Surprisingly looking through [facility] I cannot see much information about this at all. Dr. Ruthe Mannan notes from 05/29/16 show that the patient's wounds "are not healed" the right first metatarsal wound healed but then opened back up. 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 At that point the patient had a wound VAC to her right calf with regards to the wounds on her dorsal right toe would appear that these are felt to be arterial wounds.  She has had surgery on the metatarsal phalangeal in 2015 I Dr. Victorino Dike secondary to a right metatarsal phalangeal joint fracture. She is apparently had discoloration around this area since then. 08/28/16; patient arrives with her wounds in much the same condition. The linear vein harvest site and the circular wound below it which I think was a blister. She also has to probing holes in her right great toe and a necrotic eschar on the right second toe. Because of these being arterial wounds I reduced her compression from 3-2 layers this seems to of done satisfactorily she has not had any problems. I cannot see that she is actually had an x-ray ====== 11/06/16 the patient comes in for evaluation of her right lower extremity ulcers. She was here in January 2018 for 2 visits subsequently ended up in the hospital with pneumonia and then to rehabilitation. She has now been discharged from rehabilitation and is home. She has multiple ulcerations to her right lower extremity including the foot and toes. She does have home health in place and they have been placing alginate to the ulcers. She is followed by Dr. Imogene Burn of vascular medicine. She is status post a bypass graft to the right below knee popliteal to peroneal using  reverse GSV in July 2017. She recently saw him on 3/23. In office ABIs were: Right 0.48 with monophasic flow to the DP, PT peroneal , Left 0.63 with monophasic flow to the DP and PT Her arterial studies indicated a patent right below knee popliteal to peroneal bypass She had an MRI in February 2008 that was negative frosty myelitis but this showed general soft tissue edema in the right foot and lower extremity concerning for cellulitis She is a diabetic, managed with insulin. Her hemoglobin A1c in December 2017 was 8.4 which is a trend up from previous levels. She had blood work in February 2018 which revealed an albumin of 2.6 this appears to be relatively acute as an albumin in November 2017  was 3.7 11/13/16; this is a patient I have not seen since February who is readmitted to our clinic last week. 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 Imogene Burn. I have reviewed Dr. Nicky Pugh notes from March/23/18. Doppler ABI on that date showed an ABI on the right of 0.48 and on the left of 0.63. Dorsalis pedis waveforms were monophasic bilaterally. There was no waveforms detected at the posterior tibial on the right, monophasic on the left. Dr. Nicky Pugh comments were that this patient would have follow-up vascular studies in 3 months including ABIs and right lower extremity arterial duplex. She had an MRI in February that was negative for osteomyelitis but showed generalized edema in the foot. Last albumin I see was in January at 3.4 we have been using Santyl to the 4 wounds on the right leg the patient is noted today to have widespread edema well up towards her groin this is pitting 2-3+. 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 Normal ejection fraction. 11/20/16; patient has a follow-up appointment with Dr. Imogene Burn on April 23. She is still complaining of a lot of pain in the right foot and right leg. It is not clear to me that this is at all positional however I think it is clear claudication with minimal activity perhaps at rest. At our suggestion she is return to her primary physician's office tomorrow with regards to her pitting lower extremity bilateral edema that I reviewed in detail last week 11/27/16; the follow-up with Dr. Imogene Burn was actually on May 23 on April 23 as I stated in my note last week. N/A case all of her wounds seems somewhat smaller. The 2 on the right leg are definitely smaller. The areas on the dorsal right first toe, right third toe and the lateral part of the right fifth metatarsal head all looks smaller but have tightly adherent surfaces. We have been using  Santyl 12/04/16; follow-up with Dr. Imogene Burn on May 23. 2 small open wounds on the right leg continue to get smaller. The area on the right third total lateral aspect of the right fifth toe also look better. The remaining area on the dorsal first toe still has some depth to it. We have been using Santyl to the toes and collagen on the right 12/11/16; according to patient's husband the follow-up with Dr. Imogene Burn is not until July. All of the wounds on the right leg are measuring smaller. We have been using some combination of Prisma and Santyl although I think we can go to straight Prisma today. There may have been some confusion with home health about the primary dressing orders here. 12/25/16; the  patient has had some healing this week. The area on her right lateral fifth metatarsal head, right third toe are both healed lower right leg is healed. In the vein harvest site superiorly she has one superficial open area. On the dorsal aspect of her right great toe/MTP joint the wound is now divided into 2 however the proximal area is deep and there is palpable bone 01/01/17; still an open area in the middle of her original right surgical scar. The area on the right third toe and right lateral fifth metatarsal head remained closed. Problematic area on the dorsal aspect of the toe. Previous surgery in this area line 01/08/17 small open area on the original scar on her upper anterior leg although this is closing. X-ray I did of the right first toe did not show underlying bony abnormality. Still this area on the dorsal first toe probes to bone. We have been using Prisma 01/15/17; small open area in the original scar in the upper anterior leg is almost fully closed. She has 2 open areas over the dorsal aspect of the right first toe that probes to bone. Used and a form starting last week. Vigorous bone scraping that I did last week showed few methicillin sensitive staph aureus. She is [name], [name] ([id number])  [id number] Page 7 of 10 allergic to penicillin and sulfa drugs. I'm going to give her 2 weeks of doxycycline. She will need an MRI with contrast. 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 She continues on doxycycline for a bone scraping that showed methicillin sensitive staph aureus [allergic to penicillin and sulfa]. We have been using Endo form to the wound. 01/29/17; surprisingly her MRI did not show osteomyelitis. She continues on doxycycline for a bone scraping that showed methicillin sensitive staph aureus with allergies to penicillin and sulfa. We have been using Endo form to the wound. Unfortunately I cannot get a surface on this visit looks like it is able to support a healing state. Proximally there is still exposed bone. There is no overt soft tissue tenderness. Her MRI did show a previous fracture was surgery to this area but no hardware 02/12/17 on evaluation today patient appears to be doing well in regard to her lower extremity wound. She does have some mild discomfort but this is minimal. There's no evidence of infection. Her left great toe nail has somewhat been lifted up and there is a little bit of slight bleeding underneath but this is still firmly attached. 02/19/17; she ran out of doxycycline 2 days ago. Nevertheless I would like to continue this for 2 weeks to make a full 6 weeks of therapy as the bone scraping that I did from the open area on the dorsal right first toe showed MRSA. This should complete antibiotic therapy. 02/26/17; the patient is completing her 6 weeks of oral doxycycline. Deep wound on the dorsal right toe. This still has some exposed bone proximally. She does have a reasonably granulated surface albeit thin over bone. I've been using Endo form have made application for an amniotic skin sub 03/05/17; the patient has completed 6 weeks of doxycycline. This is a  deep wound on the dorsal right toe which has exposed bone proximally. She has granulation over most of this wound albeit a thin layer. I've been using Endo form. Still do not have approval for Affinity 03/13/17 on evaluation today patient's right foot wound appears  to be doing better measurement wise compared to her last evaluation. She has been tolerating the dressing change without complication and does have a appointment with the dietary that has been made as far as referral is concerned there just waiting for her and transportation to contact them back for an actual date. Nonetheless patient has been having less pain at this point. No fevers, chills, nausea, or vomiting noted at this time. 03/26/17; linear wound over the dorsal aspect of her right great toe. At one point this had exposed bone on the most superior aspect however I am pleased to see today that this appears to have a surface of granulation. Apparently product was applied for but denied by [facility]. We'll need to see what that was. The patient has known PAD status post revascularization. MRI did not show osteomyelitis which was done in June 04/02/17; continued improvement using Endoform. She was approved for [employer] but hasn't over $200 co-pay per application, this is beyond her means 04/09/17; continues on Endoform. 04/16/17; appears to be doing nicely continuing on Endoform 04/22/17; patient did not have her dressing changed all week because of the weather. Using Endoform. Base of the wound looks healthy elbow there appears to events surrounding maceration perhaps because of the drainage was not changing the wound dressing 04/30/17 wound today continues to close and except for a small divot on the superior part of the wound. This area did not probe the bone but I found this a little concerning as this was the area with exposed bone before we be able to get this to granulate forward. As I remember things she is not a candidate for  skin substitutes secondary to an outlandish co-pay. I asked her husband to look into the out of pocket max for medications if he has 1 [Regranex] or totally for skin substitutes 05/07/17; the patient arrives today with a wound roughly the same size although under careful inspection under the light there appears to be some epithelialization. Her intake nurse noted that the Endoform seemed to be placed over the wound rather than in the deeper divots they could really benefit from the Endoform. At this point I have no plans to change the Endoform as at one point this was at least 33% exposed bone and the rest of the wound very close to the underlying phalanx 05/14/17; no major change in the size or appearance of the wound. We have been using Endoform for a prolonged period of time with reasonably good improvement in the epithelialization i.e. no exposed bone especially proximally. Unless we've not made a lot of changes in the last several weeks. She does not complain of pain. She was revascularized early this year or perhaps late last year by Dr. Imogene Burn and I wonder if he will need to have a look at her, I will need to review the actual vascular procedure which I think was a distal popliteal bypass 05/21/17; switched to either for a blue last week. Patient arrived in clinic with the wound not measuring any better but the surface looking some better. Unfortunately she had some surface slough and when I went to remove this superiorly she had exposed bone. Previously she had had exposed bone in the inferior part of the wound however this is granulated over some weeks ago. Clearly this is a major step back for her. With some difficulty I was able to obtain a piece of the bone probing through the superior part of the wound for culture. We did not  send pathology. It is likely that she is going to need imaging of this site but I did not order this today 05/28/17; bone culture I took last week showed MSSA. She  still has exposed bone however I could not get another piece for pathology. She had an MRI 4 months ago that did not show osteoarthritic at time. Wound rapidly deteriorated superiorly and I suspect this is underlying osteomyelitis. We have been using Hydrofera Blue 06/04/17 on evaluation today patient's wound appears to actually be doing a little bit better visually compared to last week's evaluation which is good news. Nonetheless she does have osteomyelitis of the toe which does not appear to be MRSA. No fevers, chills, nausea, or vomiting noted at this time. She is having no discomfort. 06/11/17; patient's wound looks a little healthier than I remember seeing it 2 weeks ago. There is no exposed bone and the surface of the wound appears well granulated. We've been using hydrofera blue. I note that she was started on doxycycline which was MSSA. Her appointment with Dr. Sampson Goon is on November 12 I believe. 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 Johny Drilling of vascular surgery on 11/9. He offered right leg angiography possible intervention however the patient refused. I've discussed this with the patient's husband today she did not want any further procedures. Also noteworthy that Dr. Johny Drilling stated that this wound had not healed in 3 years, I was not aware of this degree of chronicity in this area. She has underlying osteomyelitis by bone culture. Culture of this grew MSSA, she is allergic to penicillin and therefore not a candidate for beta lactams. She saw Dr. Sampson Goon of infectious disease this morning, he continued her on doxycycline for another month and apparently sent in a prescription. We have been using Hydrofera Blue. ABI's update by Dr. Imogene Burn right 0.62, left 0.89. Monophasic wave forms 06/25/17 on evaluation today patient appears to be doing well in regard to her right great toe wound. She is still taking the doxycycline as prescribed by Dr. Sampson Goon. The  Hydrofera Blue Dressing's to be doing as well as anything has for her up to this point and we are still waiting on an insurance authorization for the Apligraft. She is having no pain which is good news. No fevers, chills, nausea, or vomiting noted at this time. 07/02/17; still taking doxycycline as prescribed by Dr. Sampson Goon. Hydrofera Blue continues. We have no palpable bone. Still have not had had approval of Apligraf 08/06/16; the patient arrives today with [facility] even though we apparently had changed to Sorbact last time. Her co-pay for Regranex is $389 in discussion with the patient and her husband this was excessive. We'll continue with the Sorbact and standard dressings for now 08/20/17; we have been using sorbact. The wound bed still requires debridement. Wound measurements are slightly better. 09/03/17;using Sorbact.no debridement today. Wound measurements slightly better. There is some discoloration of the tip of her toelooks like bruising 09/17/17; using Sorbact. No debridement today. Wound dimensions are about the same. Still some discoloration at the tip of her great toe. This does not look any worse than last time 10/01/17; using sorbact right great toe I thought she might have surface epithelialization last time although it is certainly not look like that today nevertheless her dimensions are better. Continued surface eschar on the tip of her toe but this is not progressing. She is actually complaining of the left great toe 10/15/17; this is a very difficult area on  the dorsal aspect of her proximal right great toe. At one point this wound had exposed bone however we have managed to get some degree of epithelialization. She was revascularized by Dr. Johny Drilling in [place]. She saw Dr. Sampson Goon for underlying osteomyelitis. She is not a candidate for advanced treatment options [insurance issues]. She comes in today with a new area on the tip of her right great toe. The exact history here  is unclear. We have been using sorbact 10/29/17; patient arrives today with very significant bilateral increase edema which appears to extend into the thighs. This is tight and not particularly pitting however it looks a lot more impressive than I remember. Not surprisingly the wounds on her right great toe have increased in size with weeping edema fluid as the edema extends into the dorsal foot itself. She also has a wound on the tip of her right great toe which is a new wound from 2 weeks ago. We have been using sorbact. 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 He is on Demadex presumably twice a day at 20 mg. I have asked her family to make her an appointment with her primary physicians at the [facility] to go over this degree of edema. This is causing I think deterioration in the right great toe wound. She also has significant PAD and is status post right leg bypass graft. I think this was done by [facility]. I believe they also said that there [name], [name] ([id number]) [id number] Page 8 of 10 was nothing further that could be done with regards to her vascular status. 11/12/17; patient is going to see her primary doctor this afternoon with regards to lower extremity edema. She has 2 open wounds on the right great toe the original wound on the dorsal proximal part and then a more necrotic looking area on the tip of her right great toe. We took some time today to review her vascular status. The patient had a relate below knee popliteal to peroneal artery bypass with reversed greater saphenous vein on 06/13/17; she also had endarterectomy of the midsegment of the peroneal artery. 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 I think at  some point in time after that she was offered an arteriogram on the right but she declined. I've used Silver collagen to her wounds recently. 11/26/17; patient has 2 open wounds on the right great toe tip as well as the dorsal aspect of her right great toe. She has known PAD. I've been trying to get her back to see vein and vascular in [facility] to see if there are options for endovascular surgery to help with perfusion although the patient and her husband may not want any more surgery. We've been using silver alginate because of maceration. She does not have an option for advanced treatment/skin substitutes 12/10/17; continued open wounds much the same as 2 weeks ago on the dorsal aspect of the right great toe on the right great toe tip. I've encouraged to follow- up with Dr. Imogene Burn of vein and vascular to explore the possibility that there may be correctable ischemia involved in nonhealing these areas. We made considerable progress on the dorsal right great toe however it is stalled recently. We do not have an advanced treatment option. Originally this had exposed bone however there  is a granulated base. 12/24/17; patient went back to see vein and vascular in [place]. She was seen by Langston Reusing NP. ABIs on the right were noted to be 0.84 and TBI of the right and 0.62. On the left her ABI was 0.82 and her TBI of 0.75. Waveforms were monophasic at the posterior tibial and dorsalis pedis bilaterally. 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 Her husband reiterates today that she does not want an arteriogram therefore I'm not going to press the issue. We've been using silver collagen to the wounds on the tip of the right great toe and dorsal surface of the right great toe. 01/07/18; patient arrives today with the tip of her  toe healed. An cerebral amount of slough over the wound on the dorsal toe. As noted on 12/24/17 no options currently for revascularization the patient will not allow an arteriogram 01/21/18; typical of her right great toe remains healed. Still large amount of slough over the wound on the dorsal toe. No options for revascularization. We've been using Medihoney 02/04/18; no major change. Still a punched out area on the right great toe dorsal wound. Covered in a difficult necrotic debris. 02/18/18; no major change. Still a punched out area over the right great toe. They're using medihoney 03/04/18; no major change. The wound is stalled. Still a punched out area over the right dorsal great toe. We've been using medihone for about a month not much change in the wound dimensions or the characteristics of the wound surface. We've previously used Iodoflex, Hydrofera Blue, sorbact, and I think at one point [place]. She has PAD and does not have any revascularization options although a wound distally on the tip of the great toe did close over 03/25/18; absolutely no change. Still adherent debris over the wound surface there is no palpable bone but the wound still has some depth. This is predominantly a arterial wound and she basically has refused further attempts at revascularization. I would consider this a totally palliative situation except for she managed to heal the wound at the tip of the same toe some months ago. I changed her back to Iodoflex The other issue is that [facility] has called and wants to pare back on the visits which have been 3 times a week. They will drop to 2 times a week next week 04/29/18; since the patient was last here she was admitted to hospital from 9/6 through 9/13. She was noted to have altered LOC, declining ability to walk. She is signed out as having a lower urinary tract infection and acute metabolic encephalopathy secondary to a UTI, hypercapnic respiratory failure  secondary to COPD with sleep apnea. She was discharged to [facility]. She is on oxygen chronically at 3 L. I don't think they've been doing anything specific to the wound. Her husband laments that she is no longer able to stand and walk or transfer. Yet he seems determined to take her home 05/13/18; 2 week follow-up. The patient is still at [facility]. She arrives lethargic in clinic. She was also confused during her index hospitalization. She was felt to have metabolic encephalopathy secondary to UTI. Although she also had hypercapnic respiratory failure secondary to COPD and sleep apnea. She is on chronic oxygen. Paradoxically her wound actually looks a lot better and this  is really filled in nicely. We're using silver collagen 06/03/18 3 week follow-up. [facility] but was supposed to be discharged home today. She arrives in clinic virtually unresponsive. Her wound is actually healed 12/04/2022 Ms. Candas Esser is an 82 year old female with a past medical history of insulin-dependent type 2 diabetes, peripheral arterial disease and COPD that presents the clinic for a right foot wound. She states this happened spontaneously about 4 weeks ago. Unfortunately there is bone exposed today. She has been keeping the area covered. She denies systemic signs of infection. 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 She follows with vein and vascular for peripheral arterial disease. On 04/2021 she had a right lower extremity arteriogram with stent placement in the right superficial femoral and popliteal arteries and right anterior tibial artery. She last saw them in February 2024. At that time she did not have wounds. 5/8; this is a patient is been in the clinic at variable times through the years. She has known type 2 diabetes with severe PAD. She was followed for a period of time with Dr. Gilda Crease. She has a wound on the dorsal aspect of the right  foot just proximal to the metatarsal head. This probes easily to bone. Culture last week showed methicillin sensitive Staph aureus. This was shown to to [employer] and doxycycline was ordered. The patient is allergic to penicillin. She also had an x-ray done but I do not have these results. For reasons that are not totally clear the doxycycline has not started I made it clear to her husband that needed to start this tonight. The patient has a deep probing wound to bone. She has hardware in this area as well. This goes back to 2015 when she had a metatarsal fracture repaired by Dr. Victorino Dike 5/15; patient presents for follow-up. Son is present during the encounter. She has been taking doxycycline for the past 7 days. X-ray did not show radiographic evidence of osteomyelitis however she does have obvious bone exposed. Will order an MRI. She has been using Vashe wet-to-dry dressings. Currently denies systemic signs of infection. Discussed potentially doing hyperbaric oxygen therapy. Patient and son will think about it. 6/1. Since the patient was last here she had an MRI on 5/24 and noninvasive arterial studies on 5/29. The arterial study showed a ABI of 0.85 on the right with monophasic waveforms unfortunately they did not do TBI's on the right because of the wound in this location. On the left ABI was 1.09 with monophasic waveforms her toe on the left is amputated. MRI of the right foot showed underlying marrow edema suspicious for early osteomyelitis. We have been using Vashe wet-to-dry to the wounds which her husband is changing 6/12; patient Presents for follow-up. She saw vein and vascular on 5/29 and they recommended an angiogram however patient and husband declined During that visit. Patient's husband is present today. He states that he has been waiting for a phone call from vein and vascular that is why he has not followed up. She denies systemic signs of infection. She has been using Vashe  wet-to-dry dressings. [name], [name] ([id number]) [id number] Page 9 of 10 Objective Constitutional Vitals Time Taken: 2:43 PM, Height: 64 in, Weight: 145 lbs, BMI: 24.9, Temperature: 98.7 F, Pulse: 86 bpm, Respiratory Rate: 18 breaths/min, Blood Pressure: 158/76 mmHg. General Notes: Open wound to the dorsal aspect of the first metatarsal on the right foot with nonviable tissue and  exposed bone. Difficult to palpate pedal pulses Integumentary (Hair, Skin) Wound #12 status is Open. Original cause of wound was Gradually Appeared. The date acquired was: 11/04/2022. The wound has been in treatment 6 weeks. The wound is located on the Right,Distal,Dorsal Foot. The wound measures 1.6cm length x 1.1cm width x 0.6cm depth; 1.382cm^2 area and 0.829cm^3 volume. There is bone and Fat Layer (Subcutaneous Tissue) exposed. There is no tunneling or undermining noted. There is a medium amount of serosanguineous drainage noted. There is small (1-33%) pink granulation within the wound bed. There is a large (67-100%) amount of necrotic tissue within the wound bed including Adherent Slough. Assessment Active Problems ICD-10 Non-pressure chronic ulcer of other part of right foot with necrosis of bone Type 2 diabetes mellitus with foot ulcer Peripheral vascular disease, unspecified Other chronic osteomyelitis, right ankle and foot Chronic obstructive pulmonary disease, unspecified Long term (current) use of insulin Patient has an ischemic wound to her right foot with exposed bone and osteomyelitis. She has completed 4 weeks of antibiotics with little change to the wound. No signs of acute infection. No further need for antibiotics at this time as she mainly needs her blood flow addressed. I recommended she follow-up with vein and vascular to discuss potential angiogram that they had recommended 2 weeks ago. Husband states he is driving over there now to make the appointment.There is not  much more to be done from a wound care point of view. For now Continue Vashe wet-to-dry dressings. Will continue to follow to assure that this does not become acutely infected and she has transfer of care. She will likely need a below-knee amputation Versus partial amputation of the foot. All of this was discussed with patient and husband. Husband expressed understanding. Plan Follow-up Appointments: Return Appointment in 1 week. Bathing/ Shower/ Hygiene: May shower with wound dressing protected with water repellent cover or cast protector. Anesthetic (Use 'Patient Medications' Section for Anesthetic Order Entry): Lidocaine applied to wound bed Edema Control - Lymphedema / Segmental Compressive Device / Other: Elevate, Exercise Daily and Avoid Standing for Long Periods of Time. Elevate legs to the level of the heart and pump ankles as often as possible Elevate leg(s) parallel to the floor when sitting. WOUND #12: - Foot Wound Laterality: Dorsal, Right, Distal Cleanser: Soap and Water 1 x Per Day/30 Days Discharge Instructions: Gently cleanse wound with antibacterial soap, rinse and pat dry prior to dressing wounds Prim Dressing: Gauze 1 x Per Day/30 Days ary Discharge Instructions: moistened with Dakins Solution Secondary Dressing: Gauze 1 x Per Day/30 Days Secured With: Medipore T - 38M Medipore H Soft Cloth Surgical T ape ape, 2x2 (in/yd) 1 x Per Day/30 Days 1. Continue Vashe wet-to-dry dressings 2. Follow-up with vein and vascular 3. Follow-up in 1 week Electronic Signature(s) Signed: 01/15/2023 4:14:13 PM By: Geralyn Corwin DO Entered By: Geralyn Corwin on 01/15/2023 15:15:28 -------------------------------------------------------------------------------- SuperBill Details Patient Name: Date of Service: [name], [name] 01/15/2023 [name], [name] ([id number]) [id number] Page 10 of 10 Medical Record Number: [id number] Patient Account Number:  [id number] Date of Birth/Sex: Treating RN: 13-Feb-1941 (82 y.o. Freddy Finner Primary Care Provider: Beverely Low Other Clinician: Betha Loa Referring Provider: Treating Provider/Extender: Osa Craver in Treatment: 6 Diagnosis Coding ICD-10 Codes Code Description [id number] Non-pressure chronic ulcer of other part of right foot with necrosis of bone E11.621 Type 2 diabetes mellitus with foot ulcer I73.9 Peripheral vascular disease, unspecified M86.671 Other chronic osteomyelitis, right ankle and foot J44.9 Chronic obstructive  pulmonary disease, unspecified Z79.4 Long term (current) use of insulin Facility Procedures : CPT4 Code: [id number] Description: [phone] - WOUND CARE VISIT-LEV 2 EST PT Modifier: Quantity: 1 Physician Procedures : CPT4 Code Description Modifier [id number] - WC PHYS LEVEL 3 - EST PT ICD-10 Diagnosis Description L97.514 Non-pressure chronic ulcer of other part of right foot with necrosis of bone E11.621 Type 2 diabetes mellitus with foot ulcer I73.9 Peripheral  vascular disease, unspecified M86.671 Other chronic osteomyelitis, right ankle and foot Quantity: 1 Electronic Signature(s) Signed: 01/15/2023 4:14:13 PM By: Geralyn Corwin DO Entered By: Geralyn Corwin on 01/15/2023 15:15:44

## 2023-01-16 NOTE — Telephone Encounter (Signed)
Spoke with the patient's spouse and patient is scheduled with Dr. Gilda Crease on 01/28/23 with a 11:00 am arrival time to [facility]. Pre-procedure instructions were discussed and will be mailed. Patient was offered 01/21/23 and declined stating it was not enough time to get transportation.

## 2023-01-18 NOTE — Progress Notes (Signed)
[name], [name] ([id number]) [id number] Page 1 of 9 Visit Report for 01/15/2023 Arrival Information Details Patient Name: Date of Service: [name], [name] 01/15/2023 2:30 PM Medical Record Number: [id number] Patient Account Number: [id number] Date of Birth/Sex: Treating RN: [birth year] (82 y.o. Freddy Finner Primary Care Cattaleya Wien: Beverely Low Other Clinician: Betha Loa Referring Maribelle Hopple: Treating Tejah Brekke/Extender: Osa Craver in Treatment: 6 Visit Information History Since Last Visit Added or deleted any medications: No Patient Arrived: Wheel Chair Any new allergies or adverse reactions: No Arrival Time: 14:32 Had a fall or experienced change in No Accompanied By: husband activities of daily living that may affect Transfer Assistance: None risk of falls: Patient Identification Verified: Yes Signs or symptoms of abuse/neglect since last visito No Secondary Verification Process Completed: Yes Hospitalized since last visit: No Patient Requires Transmission-Based No Implantable device outside of the clinic excluding No Precautions: cellular tissue based products placed in the center Patient Has Alerts: Yes since last visit: Patient Alerts: Patient on Blood Thinner Has Dressing in Place as Prescribed: Yes R ABI 1.32 TBI .85 2/28/2 Pain Present Now: No L ABI 1.09 TBI amp 2/28/2 Electronic Signature(s) Signed: 01/15/2023 2:42:58 PM By: Yevonne Pax RN Entered By: Yevonne Pax on 01/15/2023 14:42:58 -------------------------------------------------------------------------------- Clinic Level of Care Assessment Details Patient Name: Date of Service: TEINA, CAMPANA 01/15/2023 2:30 PM Medical Record Number: [id number] Patient Account Number: [id number] Date of Birth/Sex: Treating RN: 06-04-1941 (82 y.o. Freddy Finner Primary Care Ayline Dingus: Beverely Low Other Clinician: Betha Loa Referring Carlyne Keehan: Treating  Alycen Mack/Extender: Osa Craver in Treatment: 6 Clinic Level of Care Assessment Items TOOL 4 Quantity Score X- 1 0 Use when only an EandM is performed on FOLLOW-UP visit ASSESSMENTS - Nursing Assessment / Reassessment X- 1 10 Reassessment of Co-morbidities (includes updates in patient status) X- 1 5 Reassessment of Adherence to Treatment Plan [name], [name] ([id number]) [id number] Page 2 of 9 ASSESSMENTS - Wound and Skin A ssessment / Reassessment X - Simple Wound Assessment / Reassessment - one wound 1 5 []  - 0 Complex Wound Assessment / Reassessment - multiple wounds []  - 0 Dermatologic / Skin Assessment (not related to wound area) ASSESSMENTS - Focused Assessment []  - 0 Circumferential Edema Measurements - multi extremities []  - 0 Nutritional Assessment / Counseling / Intervention []  - 0 Lower Extremity Assessment (monofilament, tuning fork, pulses) []  - 0 Peripheral Arterial Disease Assessment (using hand held doppler) ASSESSMENTS - Ostomy and/or Continence Assessment and Care []  - 0 Incontinence Assessment and Management []  - 0 Ostomy Care Assessment and Management (repouching, etc.) PROCESS - Coordination of Care X - Simple Patient / Family Education for ongoing care 1 15 []  - 0 Complex (extensive) Patient / Family Education for ongoing care []  - 0 Staff obtains Chiropractor, Records, T Results / Process Orders est []  - 0 Staff telephones HHA, Nursing Homes / Clarify orders / etc []  - 0 Routine Transfer to another Facility (non-emergent condition) []  - 0 Routine Hospital Admission (non-emergent condition) []  - 0 New Admissions / Manufacturing engineer / Ordering NPWT Apligraf, etc. , []  - 0 Emergency Hospital Admission (emergent condition) X- 1 10 Simple Discharge Coordination []  - 0 Complex (extensive) Discharge Coordination PROCESS - Special Needs []  - 0 Pediatric / Minor Patient Management []  -  0 Isolation Patient Management []  - 0 Hearing / Language / Visual special needs []  - 0 Assessment of Community assistance (transportation, D/C planning, etc.) []  - 0 Additional assistance / Altered  mentation []  - 0 Support Surface(s) Assessment (bed, cushion, seat, etc.) INTERVENTIONS - Wound Cleansing / Measurement X - Simple Wound Cleansing - one wound 1 5 []  - 0 Complex Wound Cleansing - multiple wounds X- 1 5 Wound Imaging (photographs - any number of wounds) []  - 0 Wound Tracing (instead of photographs) X- 1 5 Simple Wound Measurement - one wound []  - 0 Complex Wound Measurement - multiple wounds INTERVENTIONS - Wound Dressings X - Small Wound Dressing one or multiple wounds 1 10 []  - 0 Medium Wound Dressing one or multiple wounds []  - 0 Large Wound Dressing one or multiple wounds []  - 0 Application of Medications - topical []  - 0 Application of Medications - injection INTERVENTIONS - Miscellaneous []  - 0 External ear exam [name], [name] ([id number]) [id number] Page 3 of 9 []  - 0 Specimen Collection (cultures, biopsies, blood, body fluids, etc.) []  - 0 Specimen(s) / Culture(s) sent or taken to Lab for analysis []  - 0 Patient Transfer (multiple staff / Michiel Sites Lift / Similar devices) []  - 0 Simple Staple / Suture removal (25 or less) []  - 0 Complex Staple / Suture removal (26 or more) []  - 0 Hypo / Hyperglycemic Management (close monitor of Blood Glucose) []  - 0 Ankle / Brachial Index (ABI) - do not check if billed separately X- 1 5 Vital Signs Has the patient been seen at the hospital within the last three years: Yes Total Score: 75 Level Of Care: New/Established - Level 2 Electronic Signature(s) Signed: 01/16/2023 4:13:25 PM By: Yevonne Pax RN Entered By: Yevonne Pax on 01/15/2023 14:53:18 -------------------------------------------------------------------------------- Encounter Discharge Information Details Patient Name: Date  of Service: [name] 01/15/2023 2:30 PM Medical Record Number: [id number] Patient Account Number: [id number] Date of Birth/Sex: Treating RN: 1940-12-08 (82 y.o. Freddy Finner Primary Care Makynzie Dobesh: Beverely Low Other Clinician: Betha Loa Referring Helaman Mecca: Treating Aissatou Fronczak/Extender: Osa Craver in Treatment: 6 Encounter Discharge Information Items Discharge Condition: Stable Ambulatory Status: Wheelchair Discharge Destination: Home Transportation: Private Auto Accompanied By: husband Schedule Follow-up Appointment: Yes Clinical Summary of Care: Electronic Signature(s) Signed: 01/16/2023 4:13:25 PM By: Yevonne Pax RN Entered By: Yevonne Pax on 01/15/2023 14:54:35 -------------------------------------------------------------------------------- Lower Extremity Assessment Details Patient Name: Date of Service: [name], [name] 01/15/2023 2:30 PM RAMANDEEP, ONDER ([id number]) [id number] Page 4 of 9 Medical Record Number: [id number] Patient Account Number: [id number] Date of Birth/Sex: Treating RN: Jun 30, 1941 (82 y.o. Freddy Finner Primary Care Ammaar Encina: Beverely Low Other Clinician: Betha Loa Referring Mystery Schrupp: Treating Daren Yeagle/Extender: Glendon Axe Weeks in Treatment: 6 Edema Assessment Assessed: [Left: No] [Right: Yes] Edema: [Left: N] [Right: o] Calf Left: Right: Point of Measurement: 30 cm From Medial Instep 25 cm Ankle Left: Right: Point of Measurement: 10 cm From Medial Instep 19 cm Vascular Assessment Pulses: Dorsalis Pedis Palpable: [Right:Yes] Electronic Signature(s) Signed: 01/15/2023 2:45:09 PM By: Yevonne Pax RN Entered By: Yevonne Pax on 01/15/2023 14:45:09 -------------------------------------------------------------------------------- Multi Wound Chart Details Patient Name: Date of Service: [name] 01/15/2023 2:30 PM Medical Record Number: [id number] Patient  Account Number: [id number] Date of Birth/Sex: Treating RN: [birth year] (82 y.o. 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 Cataracts, Chronic sinus Comorbid History: problems/congestion, Congestive Heart Failure, Hypertension,  Type II Diabetes 11/04/2022 Date Acquired: 6 Weeks of Treatment: Open Wound Status: No Wound Recurrence:] [N/A:N/A N/A N/A N/A N/A N/A N/A N/A N/A] [name], [name] ([id number]) [12:Yes Pending A mputation on Presentation: 1.6x1.1x0.6 Measurements L x W x D (cm) 1.382 A (cm) : rea 0.829 Volume (cm) : -635.10% % Reduction in A rea: -781.90% % Reduction in Volume: Grade 2 Classification: Medium Exudate A mount: Serosanguineous  Exudate Type: red, brown Exudate Color: Small (1-33%) Granulation A mount: Pink Granulation Quality: Large (67-100%) Necrotic A mount: Fat Layer (Subcutaneous Tissue): Yes N/A Exposed Structures: Bone: Yes Fascia: No Tendon: No Muscle: No Joint: No None  Epithelialization:] [N/A:N/A N/A N/A N/A N/A N/A N/A N/A N/A N/A N/A N/A N/A N/A] Treatment Notes Electronic Signature(s) Signed: 01/15/2023 2:45:56 PM By: Yevonne Pax RN Entered By: Yevonne Pax on 01/15/2023 14:45:56 -------------------------------------------------------------------------------- Multi-Disciplinary Care Plan Details Patient Name: Date of Service: [name] 01/15/2023 2:30 PM Medical Record Number: [id number] Patient Account Number: [id number] Date of Birth/Sex: Treating RN: 09/14/1940 (82 y.o. Freddy Finner Primary Care Cinda Hara: Beverely Low Other Clinician: Betha Loa Referring Aubria Vanecek: Treating Maliya Marich/Extender: Osa Craver in Treatment: 6 Active Inactive Wound/Skin Impairment Nursing Diagnoses: Knowledge deficit related to ulceration/compromised skin integrity Goals: Patient/caregiver will verbalize understanding of skin care regimen Date Initiated: 12/04/2022 Target Resolution Date: 02/03/2023 Goal Status: Active Ulcer/skin breakdown will have a volume reduction of 30% by week 4 Date Initiated: 12/04/2022 Date Inactivated: 01/08/2023 Target Resolution Date: 01/04/2023 Goal Status: Unmet Unmet Reason: comorbidities Ulcer/skin breakdown will have a volume reduction of 50% by week 8 Date Initiated: 12/04/2022 Target Resolution Date: 02/03/2023 Goal Status: Active Ulcer/skin breakdown will have a volume reduction of 80% by week 12 Date Initiated: 12/04/2022 Target Resolution Date: 03/06/2023 Goal Status: Active Ulcer/skin breakdown will heal within 14 weeks Date Initiated: 12/04/2022 Target Resolution Date: 04/06/2023 Goal Status: Active [name], [name] ([id number]) [id number] Page 6 of 9 Interventions: Assess patient/caregiver ability to obtain necessary supplies Assess patient/caregiver ability to perform ulcer/skin care regimen upon admission and as needed Assess ulceration(s) every visit Notes: Electronic Signature(s) Signed: 01/15/2023 2:46:27 PM By: Yevonne Pax RN Entered By: Yevonne Pax on 01/15/2023 14:46:27 -------------------------------------------------------------------------------- Pain Assessment Details Patient Name: Date of Service: [name], [name] 01/15/2023 2:30 PM Medical Record Number: [id number] Patient Account Number: [id number] Date of Birth/Sex: Treating RN: July 22, 1941 (82 y.o. Freddy Finner Primary Care Ryian Lynde: Beverely Low Other Clinician: Betha Loa Referring Rickie Gutierres: Treating  Zeena Starkel/Extender: Osa Craver in Treatment: 6 Active Problems Location of Pain Severity and Description of Pain Patient Has Paino No Site Locations Pain Management and Medication Current Pain Management: Electronic Signature(s) Signed: 01/15/2023 2:43:37 PM By: Yevonne Pax RN Entered By: Yevonne Pax on 01/15/2023 14:43:37 [name], [name] ([id number]) [id number] Page 7 of 9 -------------------------------------------------------------------------------- Patient/Caregiver Education Details Patient Name: Date of Service: [name], [name] 6/12/2024andnbsp2:30 PM Medical Record Number: [id number] Patient Account Number: [id number] Date of Birth/Gender: Treating RN: 1940-10-01 (82 y.o. Freddy Finner Primary Care Physician: Beverely Low Other Clinician: Betha Loa Referring Physician: Treating Physician/Extender: Osa Craver in Treatment: 6 Education Assessment Education Provided To: Patient Education Topics Provided Wound/Skin Impairment: Handouts: Caring for Your Ulcer Methods: Explain/Verbal Responses: State content correctly Electronic Signature(s) Signed: 01/16/2023 4:13:25 PM By: Yevonne Pax RN Entered By: Yevonne Pax on 01/15/2023 14:46:38 -------------------------------------------------------------------------------- Wound Assessment Details Patient Name: Date of Service: [name],  [name] Majestic White. 01/15/2023 2:30 PM Medical Record Number: [id number] Patient Account Number: [id number] Date of Birth/Sex: Treating RN: 1941-07-29 (82 y.o. Freddy Finner Primary Care Briena Swingler: Beverely Low Other Clinician: Betha Loa Referring Virgilia Quigg: Treating Tailer Volkert/Extender: Glendon Axe Weeks in Treatment: 6 Wound Status Wound Number: 12 Primary Diabetic Wound/Ulcer of the Lower Extremity Etiology: Wound Location: Right, Distal, Dorsal Foot Wound Open Wounding Event: Gradually  Appeared Status: Date Acquired: 11/04/2022 Comorbid Cataracts, Chronic sinus problems/congestion, Congestive Heart Weeks Of Treatment: 6 History: Failure, Hypertension, Type II Diabetes Clustered Wound: No Pending Amputation On Presentation Photos ELANDA, [name] ([id number]) [id number] Page 8 of 9 Wound Measurements Length: (cm) 1.6 Width: (cm) 1.1 Depth: (cm) 0.6 Area: (cm) 1.382 Volume: (cm) 0.829 % Reduction in Area: -635.1% % Reduction in Volume: -781.9% Epithelialization: None Tunneling: No Undermining: No Wound Description Classification: Grade 2 Exudate Amount: Medium Exudate Type: Serosanguineous Exudate Color: red, brown Foul Odor After Cleansing: No Slough/Fibrino Yes Wound Bed Granulation Amount: Small (1-33%) Exposed Structure Granulation Quality: Pink Fascia Exposed: No Necrotic Amount: Large (67-100%) Fat Layer (Subcutaneous Tissue) Exposed: Yes Necrotic Quality: Adherent Slough Tendon Exposed: No Muscle Exposed: No Joint Exposed: No Bone Exposed: Yes Treatment Notes Wound #12 (Foot) Wound Laterality: Dorsal, Right, Distal Cleanser Soap and Water Discharge Instruction: Gently cleanse wound with antibacterial soap, rinse and pat dry prior to dressing wounds Peri-Wound Care Topical Primary Dressing Gauze Discharge Instruction: moistened with Dakins Solution Secondary Dressing Gauze Secured With Medipore T - 69M Medipore H Soft Cloth Surgical T ape ape, 2x2 (in/yd) Compression Wrap Compression Stockings Add-Ons Electronic Signature(s) Signed: 01/16/2023 4:13:25 PM By: Yevonne Pax RN Previous Signature: 01/15/2023 2:44:47 PM Version By: Yevonne Pax RN Entered By: Yevonne Pax on 01/15/2023 15:17:58 [name], [name] ([id number]) [id number] Page 9 of 9 -------------------------------------------------------------------------------- Vitals Details Patient Name: Date of Service: MARYTZA, FUSON  01/15/2023 2:30 PM Medical Record Number: [id number] Patient Account Number: [id number] Date of Birth/Sex: Treating RN: 1940/08/23 (82 y.o. Freddy Finner Primary Care Anniemae Haberkorn: Beverely Low Other Clinician: Betha Loa Referring Bhavya Eschete: Treating Daney Moor/Extender: Osa Craver in Treatment: 6 Vital Signs Time Taken: 14:43 Temperature (F): 98.7 Height (in): 64 Pulse (bpm): 86 Weight (lbs): 145 Respiratory Rate (breaths/min): 18 Body Mass Index (BMI): 24.9 Blood Pressure (mmHg): 158/76 Reference Range: 80 - 120 mg / dl Electronic Signature(s) Signed: 01/15/2023 2:43:24 PM By: Yevonne Pax RN Entered By: Yevonne Pax on 01/15/2023 14:43:24

## 2023-01-22 ENCOUNTER — Encounter (HOSPITAL_BASED_OUTPATIENT_CLINIC_OR_DEPARTMENT_OTHER): Payer: Medicare HMO | Admitting: Internal Medicine

## 2023-01-22 ENCOUNTER — Telehealth (INDEPENDENT_AMBULATORY_CARE_PROVIDER_SITE_OTHER): Payer: Self-pay

## 2023-01-22 DIAGNOSIS — L97514 Non-pressure chronic ulcer of other part of right foot with necrosis of bone: Secondary | ICD-10-CM | POA: Diagnosis not present

## 2023-01-22 DIAGNOSIS — I739 Peripheral vascular disease, unspecified: Secondary | ICD-10-CM | POA: Diagnosis not present

## 2023-01-22 DIAGNOSIS — M86671 Other chronic osteomyelitis, right ankle and foot: Secondary | ICD-10-CM

## 2023-01-22 DIAGNOSIS — E11621 Type 2 diabetes mellitus with foot ulcer: Secondary | ICD-10-CM | POA: Diagnosis not present

## 2023-01-22 NOTE — Telephone Encounter (Signed)
[name] called to confirm [name]'s surgical appt and time of arrival for the 25th of June also, to get directions to the [facility].

## 2023-01-27 NOTE — Progress Notes (Signed)
[name], [name] ([id number]) [id number] Page 1 of 13 Visit Report for 01/22/2023 Chief Complaint Document Details Patient Name: Date of Service: [name], [name] 01/22/2023 1:00 PM Medical Record Number: [id number] Patient Account Number: [id number] Date of Birth/Sex: Treating RN: 1941/05/08 (82 y.o. Skip Mayer Primary Care Provider: Beverely Low Other Clinician: Betha Loa Referring Provider: Treating Provider/Extender: Glendon Axe Weeks in Treatment: 7 Information Obtained from: Patient Chief Complaint 12/04/2022; right dorsal foot wound Electronic Signature(s) Signed: 01/23/2023 12:49:28 PM By: Geralyn Corwin DO Entered By: Geralyn Corwin on 01/22/2023 13:34:00 -------------------------------------------------------------------------------- HPI Details Patient Name: Date of Service: [name] 01/22/2023 1:00 PM Medical Record Number: [id number] Patient Account Number: [id number] Date of Birth/Sex: Treating RN: 08-Feb-1941 (82 y.o. Skip Mayer Primary Care Provider: Beverely Low Other Clinician: Betha Loa Referring Provider: Treating Provider/Extender: Osa Craver in Treatment: 7 History of Present Illness HPI Description: 08/13/16: This is a 82 year old woman who came predominantly for review of 3 cm in diameter circular wound to the left anterior lateral leg. She was in the ER on 08/01/16 I reviewed their notes. There was apparently pus coming out of the wound at that time and the patient arrived requesting debridement which they don't do in the emergency room. Nevertheless I can't see that they did any x-rays. There were no cultures done. She is a type II diabetic and I a note after the patient was in the clinic that she had a bypass graft from the popliteal to the tibial on the right on 02/28/16. She also had a right greater saphenous vein harvest on the same date for arterial bypass. She is  going to have vascular studies including ABIs T ABIs on the right on 08/28/16. The patient's surgery was on 02/28/16 by Dr. Johny Drilling she had a right below the knee popliteal artery to peroneal artery bypass with reverse greater saphenous vein and an endarterectomy of the mid segment peroneal artery. Postoperatively she had a strong mild monophasic peroneal signal with a pink foot. It would appear that the patient is had some nonhealing in the surgical saphenous vein harvest site on the left leg. Surprisingly looking through [facility] I cannot see much information about this at all. Dr. Ruthe Mannan notes from 05/29/16 show that the patient's wounds "are not healed" the right first metatarsal wound healed but then opened back up. 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 At that point the patient had a wound VAC to her right calf with regards to the wounds on her dorsal right toe would appear that these are felt to be arterial wounds. She has had surgery on the metatarsal phalangeal in 2015 I Dr. Victorino Dike secondary to a right metatarsal phalangeal joint fracture. She is apparently had discoloration around this area since then. AVIKA, [name] ([id number]) [id number] Page 2 of 13 08/28/16; patient arrives with her wounds in much the same condition. The linear vein harvest site and the circular wound below it which I think was a blister. She also has to probing holes in her right great toe and a necrotic eschar on the right second toe. Because of these being arterial wounds I reduced her compression from 3-2 layers this seems to of done satisfactorily she has not had any problems. I cannot see that she is actually had an x-ray ====== 11/06/16 the patient comes in for evaluation of her right lower extremity ulcers.  She was here in January 2018 for 2 visits subsequently ended up in the  hospital with pneumonia and then to rehabilitation. She has now been discharged from rehabilitation and is home. She has multiple ulcerations to her right lower extremity including the foot and toes. She does have home health in place and they have been placing alginate to the ulcers. She is followed by Dr. Imogene Burn of vascular medicine. 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 She recently saw him on 3/23. In office ABIs were: Right 0.48 with monophasic flow to the DP, PT peroneal , Left 0.63 with monophasic flow to the DP and PT Her arterial studies indicated a patent right below knee popliteal to peroneal bypass She had an MRI in February 2008 that was negative frosty myelitis but this showed general soft tissue edema in the right foot and lower extremity concerning for cellulitis She is a diabetic, managed with insulin. Her hemoglobin A1c in December 2017 was 8.4 which is a trend up from previous levels. She had blood work in February 2018 which revealed an albumin of 2.6 this appears to be relatively acute as an albumin in November 2017 was 3.7 11/13/16; this is a patient I have not seen since February who is readmitted to our clinic last week. 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 Imogene Burn. I have reviewed Dr. Nicky Pugh notes from March/23/18. Doppler ABI on that date showed an ABI on the right of 0.48 and on the left of 0.63. Dorsalis pedis waveforms were monophasic bilaterally. There was no waveforms detected at the posterior tibial on the right, monophasic on the left. Dr. Nicky Pugh comments were that this patient would have follow-up vascular studies in 3 months including ABIs and right lower extremity arterial duplex. She had an MRI in February that was negative for osteomyelitis but showed generalized edema in the foot. Last albumin I see was in January at  3.4 we have been using Santyl to the 4 wounds on the right leg the patient is noted today to have widespread edema well up towards her groin this is pitting 2-3+. 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 Normal ejection fraction. 11/20/16; patient has a follow-up appointment with Dr. Imogene Burn on April 23. She is still complaining of a lot of pain in the right foot and right leg. It is not clear to me that this is at all positional however I think it is clear claudication with minimal activity perhaps at rest. At our suggestion she is return to her primary physician's office tomorrow with regards to her pitting lower extremity bilateral edema that I reviewed in detail last week 11/27/16; the follow-up with Dr. Imogene Burn was actually on May 23 on April 23 as I stated in my note last week. N/A case all of her wounds seems somewhat smaller. The 2 on the right leg are definitely smaller. The areas on the dorsal right first toe, right third toe and the lateral part of the right fifth metatarsal head all looks smaller but have tightly adherent surfaces. We have been using Santyl 12/04/16; follow-up with Dr. Imogene Burn on May 23. 2 small open wounds on the right leg continue to get smaller. The area on the right third total lateral aspect of the right fifth toe also look better. The remaining area  on the dorsal first toe still has some depth to it. We have been using Santyl to the toes and collagen on the right 12/11/16; according to patient's husband the follow-up with Dr. Imogene Burn is not until July. All of the wounds on the right leg are measuring smaller. We have been using some combination of Prisma and Santyl although I think we can go to straight Prisma today. There may have been some confusion with home health about the primary dressing orders here. 12/25/16; the patient has had some healing this week. The area on her right lateral fifth metatarsal head, right third  toe are both healed lower right leg is healed. In the vein harvest site superiorly she has one superficial open area. On the dorsal aspect of her right great toe/MTP joint the wound is now divided into 2 however the proximal area is deep and there is palpable bone 01/01/17; still an open area in the middle of her original right surgical scar. The area on the right third toe and right lateral fifth metatarsal head remained closed. Problematic area on the dorsal aspect of the toe. Previous surgery in this area line 01/08/17 small open area on the original scar on her upper anterior leg although this is closing. X-ray I did of the right first toe did not show underlying bony abnormality. Still this area on the dorsal first toe probes to bone. We have been using Prisma 01/15/17; small open area in the original scar in the upper anterior leg is almost fully closed. She has 2 open areas over the dorsal aspect of the right first toe that probes to bone. Used and a form starting last week. Vigorous bone scraping that I did last week showed few methicillin sensitive staph aureus. She is allergic to penicillin and sulfa drugs. I'm going to give her 2 weeks of doxycycline. She will need an MRI with contrast. 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 She continues on doxycycline for a bone scraping that showed methicillin sensitive staph aureus [allergic to penicillin and sulfa]. We have been using Endo form to the wound. 01/29/17; surprisingly her MRI did not show osteomyelitis. She continues on doxycycline for a bone scraping that showed methicillin sensitive staph aureus with allergies to penicillin and sulfa. We have been using Endo form to the wound. Unfortunately I cannot get a surface on this visit looks like it is able to support a healing state. Proximally there is still exposed bone. There is no overt soft tissue tenderness. Her  MRI did show a previous fracture was surgery to this area but no hardware 02/12/17 on evaluation today patient appears to be doing well in regard to her lower extremity wound. She does have some mild discomfort but this is minimal. There's no evidence of infection. Her left great toe nail has somewhat been lifted up and there is a little bit of slight bleeding underneath but this is still firmly attached. 02/19/17; she ran out of doxycycline 2 days ago. Nevertheless I would like to continue this for 2 weeks to make a full 6 weeks of therapy as the bone scraping that I did from the open area on the dorsal right first toe showed MRSA. This should complete antibiotic therapy. 02/26/17; the patient is completing her 6 weeks of oral doxycycline. Deep wound on the dorsal right toe. This still has some exposed bone proximally. She does have a  reasonably granulated surface albeit thin over bone. I've been using Endo form have made application for an amniotic skin sub 03/05/17; the patient has completed 6 weeks of doxycycline. This is a deep wound on the dorsal right toe which has exposed bone proximally. She has granulation over most of this wound albeit a thin layer. I've been using Endo form. Still do not have approval for Affinity 03/13/17 on evaluation today patient's right foot wound appears to be doing better measurement wise compared to her last evaluation. She has been tolerating the dressing change without complication and does have a appointment with the dietary that has been made as far as referral is concerned there just waiting for her and transportation to contact them back for an actual date. Nonetheless patient has been having less pain at this point. No fevers, chills, nausea, or vomiting noted at this time. 03/26/17; linear wound over the dorsal aspect of her right great toe. At one point this had exposed bone on the most superior aspect however I am pleased to see today that this appears to have a  surface of granulation. Apparently product was applied for but denied by [facility]. We'll need to see what that was. The patient has known PAD status post revascularization. MRI did not show osteomyelitis which was done in June 04/02/17; continued improvement using Endoform. She was approved for Oasis but hasn't over $200 co-pay per application, this is beyond her means 04/09/17; continues on Endoform. 04/16/17; appears to be doing nicely continuing on Endoform 04/22/17; patient did not have her dressing changed all week because of the weather. Using Endoform. Base of the wound looks healthy elbow there appears to events surrounding maceration perhaps because of the drainage was not changing the wound dressing 04/30/17 wound today continues to close and except for a small divot on the superior part of the wound. This area did not probe the bone but I found this a little concerning as this was the area with exposed bone before we be able to get this to granulate forward. As I remember things she is not a candidate for skin substitutes secondary to an outlandish co-pay. I asked her husband to look into the out of pocket max for medications if he has 1 [Regranex] or totally for skin substitutes 05/07/17; the patient arrives today with a wound roughly the same size although under careful inspection under the light there appears to be some epithelialization. Her intake nurse noted that the Endoform seemed to be placed over the wound rather than in the deeper divots they could really benefit from the Endoform. At this point I have no plans to change the Endoform as at one point this was at least 33% exposed bone and the rest of the wound very close to the underlying phalanx 05/14/17; no major change in the size or appearance of the wound. We have been using Endoform for a prolonged period of time with reasonably good improvement in the epithelialization i.e. no exposed bone especially proximally. Unless we've not  made a lot of changes in the last several weeks. She does not complain of pain. She was revascularized early this year or perhaps late last year by Dr. Imogene Burn and I wonder if he will need to have a look at her, I will need to review the actual vascular procedure which I think was a distal popliteal bypass 05/21/17; switched to either for a blue last week. Patient arrived in clinic with the wound not measuring any better but the  surface looking some better. Unfortunately she had some surface slough and when I went to remove this superiorly she had exposed bone. Previously she had had exposed bone in the inferior part of the wound however this is granulated over some weeks ago. Clearly this is a major step back for her. With some difficulty I was able to obtain [name], [name] ([id number]) [id number] Page 3 of 13 a piece of the bone probing through the superior part of the wound for culture. We did not send pathology. It is likely that she is going to need imaging of this site but I did not order this today 05/28/17; bone culture I took last week showed MSSA. She still has exposed bone however I could not get another piece for pathology. She had an MRI 4 months ago that did not show osteoarthritic at time. Wound rapidly deteriorated superiorly and I suspect this is underlying osteomyelitis. We have been using Hydrofera Blue 06/04/17 on evaluation today patient's wound appears to actually be doing a little bit better visually compared to last week's evaluation which is good news. Nonetheless she does have osteomyelitis of the toe which does not appear to be MRSA. No fevers, chills, nausea, or vomiting noted at this time. She is having no discomfort. 06/11/17; patient's wound looks a little healthier than I remember seeing it 2 weeks ago. There is no exposed bone and the surface of the wound appears well granulated. We've been using hydrofera blue. I note that she was started on  doxycycline which was MSSA. Her appointment with Dr. Sampson Goon is on November 12 I believe. 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 Johny Drilling of vascular surgery on 11/9. He offered right leg angiography possible intervention however the patient refused. I've discussed this with the patient's husband today she did not want any further procedures. Also noteworthy that Dr. Johny Drilling stated that this wound had not healed in 3 years, I was not aware of this degree of chronicity in this area. She has underlying osteomyelitis by bone culture. Culture of this grew MSSA, she is allergic to penicillin and therefore not a candidate for beta lactams. She saw Dr. Sampson Goon of infectious disease this morning, he continued her on doxycycline for another month and apparently sent in a prescription. We have been using Hydrofera Blue. ABI's update by Dr. Imogene Burn right 0.62, left 0.89. Monophasic wave forms 06/25/17 on evaluation today patient appears to be doing well in regard to her right great toe wound. She is still taking the doxycycline as prescribed by Dr. Sampson Goon. The Hydrofera Blue Dressing's to be doing as well as anything has for her up to this point and we are still waiting on an insurance authorization for the Apligraft. She is having no pain which is good news. No fevers, chills, nausea, or vomiting noted at this time. 07/02/17; still taking doxycycline as prescribed by Dr. Sampson Goon. Hydrofera Blue continues. We have no palpable bone. Still have not had had approval of Apligraf 08/06/16; the patient arrives today with [name] even though we apparently had changed to Sorbact last time. Her co-pay for Regranex is $389 in discussion with the patient and her husband this was excessive. We'll continue with the Sorbact and standard dressings for now 08/20/17; we have been using sorbact. The wound bed still requires debridement. Wound measurements are slightly better. 09/03/17;using  Sorbact.no debridement today. Wound measurements slightly better. There is some discoloration of the tip of her toelooks like bruising 09/17/17; using Sorbact. No  debridement today. Wound dimensions are about the same. Still some discoloration at the tip of her great toe. This does not look any worse than last time 10/01/17; using sorbact right great toe I thought she might have surface epithelialization last time although it is certainly not look like that today nevertheless her dimensions are better. Continued surface eschar on the tip of her toe but this is not progressing. She is actually complaining of the left great toe 10/15/17; this is a very difficult area on the dorsal aspect of her proximal right great toe. At one point this wound had exposed bone however we have managed to get some degree of epithelialization. She was revascularized by Dr. Johny Drilling in [place]. She saw Dr. Sampson Goon for underlying osteomyelitis. She is not a candidate for advanced treatment options [insurance issues]. She comes in today with a new area on the tip of her right great toe. The exact history here is unclear. We have been using sorbact 10/29/17; patient arrives today with very significant bilateral increase edema which appears to extend into the thighs. This is tight and not particularly pitting however it looks a lot more impressive than I remember. Not surprisingly the wounds on her right great toe have increased in size with weeping edema fluid as the edema extends into the dorsal foot itself. She also has a wound on the tip of her right great toe which is a new wound from 2 weeks ago. We have been using sorbact. 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 He is on Demadex presumably twice a day at 20 mg. I have asked her family to make her an appointment with her primary physicians at the [place] [facility] to go over this degree of  edema. This is causing I think deterioration in the right great toe wound. She also has significant PAD and is status post right leg bypass graft. I think this was done by [facility]. I believe they also said that there was nothing further that could be done with regards to her vascular status. 11/12/17; patient is going to see her primary doctor this afternoon with regards to lower extremity edema. She has 2 open wounds on the right great toe the original wound on the dorsal proximal part and then a more necrotic looking area on the tip of her right great toe. We took some time today to review her vascular status. The patient had a relate below knee popliteal to peroneal artery bypass with reversed greater saphenous vein on 06/13/17; she also had endarterectomy of the midsegment of the peroneal artery. 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 I think at some point in time after that she was offered an arteriogram on the right but she declined. I've used Silver collagen to her wounds recently. 11/26/17; patient has 2 open wounds on the right great toe tip as well as the dorsal aspect of her right great toe. She has known PAD. I've been trying to get her back to see vein and vascular in [facility] to see if there are options for endovascular surgery to help with perfusion although the patient and her husband may not want any more surgery. We've been using silver alginate because of maceration. She does not have an option for advanced treatment/skin substitutes 12/10/17; continued open wounds much the  same as 2 weeks ago on the dorsal aspect of the right great toe on the right great toe tip. I've encouraged to follow- up with Dr. Imogene Burn of vein and vascular to explore the possibility that there may be correctable ischemia involved in nonhealing these areas.  We made considerable progress on the dorsal right great toe however it is stalled recently. We do not have an advanced treatment option. Originally this had exposed bone however there is a granulated base. 12/24/17; patient went back to see vein and vascular in [place]. She was seen by Langston Reusing NP. ABIs on the right were noted to be 0.84 and TBI of the right and 0.62. On the left her ABI was 0.82 and her TBI of 0.75. Waveforms were monophasic at the posterior tibial and dorsalis pedis bilaterally. 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 Her husband reiterates today that she does not want an arteriogram therefore I'm not going to press the issue. We've been using silver collagen to the wounds on the tip of the right great toe and dorsal surface of the right great toe. 01/07/18; patient arrives today with the tip of her toe healed. An cerebral amount of slough over the wound on the dorsal toe. As noted on 12/24/17 no options currently for revascularization the patient will not allow an arteriogram 01/21/18; typical of her right great toe remains healed. Still large amount of slough over the wound on the dorsal toe. No options for revascularization. We've been using Medihoney 02/04/18; no major change. Still a punched out area on the right great toe dorsal wound. Covered in a difficult necrotic debris. 02/18/18; no major change. Still a punched out area over the right great toe. They're using medihoney 03/04/18; no major change. The wound is stalled. Still a punched out area over the right dorsal great toe. We've been using medihone for about a month not much change in the wound dimensions or the characteristics of the wound surface. We've previously used Iodoflex, Hydrofera Blue, sorbact, and I think at one point [place].  She has PAD and does not have any revascularization options although a wound distally on the tip of the great toe did close over 03/25/18; absolutely no change. Still adherent debris over the wound surface there is no palpable bone but the wound still has some depth. This is predominantly a arterial wound and she basically has refused further attempts at revascularization. I would consider this a totally palliative situation except for she managed to heal the wound at the tip of the same toe some months ago. I changed her back to Iodoflex The other issue is that [facility] has called and wants to pare back on the visits which have been 3 times a week. They will drop to 2 times a week next week 04/29/18; since the patient was last here she was admitted to hospital from 9/6 through 9/13. She was noted to have altered LOC, declining ability to walk. 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 She was discharged to [facility]. She is on oxygen chronically at 3 L. I don't think they've been doing anything specific to [name], [name] ([id number]) [id number] Page 4 of 13 the  wound. Her husband laments that she is no longer able to stand and walk or transfer. Yet he seems determined to take her home 05/13/18; 2 week follow-up. The patient is still at [facility]. She arrives lethargic in clinic. She was also confused during her index hospitalization. She was felt to have metabolic encephalopathy secondary to UTI. Although she also had hypercapnic respiratory failure secondary to COPD and sleep apnea. She is on chronic oxygen. Paradoxically her wound actually looks a lot better and this is really filled in nicely. We're using silver collagen 06/03/18 3 week follow-up. The [facility] is on [facility] but was supposed  to be discharged home today. She arrives in clinic virtually unresponsive. Her wound is actually healed 12/04/2022 Ms. Britanni Yarde is an 82 year old female with a past medical history of insulin-dependent type 2 diabetes, peripheral arterial disease and COPD that presents the clinic for a right foot wound. She states this happened spontaneously about 4 weeks ago. Unfortunately there is bone exposed today. She has been keeping the area covered. She denies systemic signs of infection. 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 She follows with vein and vascular for peripheral arterial disease. On 04/2021 she had a right lower extremity arteriogram with stent placement in the right superficial femoral and popliteal arteries and right anterior tibial artery. She last saw them in February 2024. At that time she did not have wounds. 5/8; this is a patient is been in the clinic at variable times through the years. She has known type 2 diabetes with severe PAD. She was followed for a period of time with Dr. Gilda Crease. She has a wound on the dorsal aspect of the right foot just proximal to the metatarsal head. This probes easily to bone. Culture last week showed methicillin sensitive Staph aureus. This was shown to to [employer] and doxycycline was ordered. The patient is allergic to penicillin. She also had an x-ray done but I do not have these results. For reasons that are not totally clear the doxycycline has not started I made it clear to her husband that needed to start this tonight. The patient has a deep probing wound to bone. She has hardware in this area as well. This goes back to 2015 when she had a metatarsal fracture repaired by Dr. Victorino Dike 5/15; patient presents for follow-up. Son is present during the encounter. She has been taking doxycycline for the past 7 days. X-ray did not show radiographic evidence of osteomyelitis however she does have obvious bone exposed. Will order an  MRI. She has been using Vashe wet-to-dry dressings. Currently denies systemic signs of infection. Discussed potentially doing hyperbaric oxygen therapy. Patient and son will think about it. 6/1. Since the patient was last here she had an MRI on 5/24 and noninvasive arterial studies on 5/29. The arterial study showed a ABI of 0.85 on the right with monophasic waveforms unfortunately they did not do TBI's on the right because of the wound in this location. On the left ABI was 1.09 with monophasic waveforms her toe on the left is amputated. MRI of the right foot showed underlying marrow edema suspicious for early osteomyelitis. We have been using Vashe wet-to-dry to the wounds which her husband is changing 6/12; patient Presents for follow-up. She saw vein and vascular on 5/29 and they recommended an angiogram however patient and husband declined During that visit. Patient's husband is present today. He states that he has been waiting for  a phone call from vein and vascular that is why he has not followed up. She denies systemic signs of infection. She has been using Vashe wet-to-dry dressings. 6/19; patient presents for follow-up. Plan is for lower extremity angiogram by vein and vascular on 6/25. She continues to use Vashe wet-to-dry dressings to the open wound. She complains of chronic pain. Other than that she has no acute changes since last visit. Electronic Signature(s) Signed: 01/23/2023 12:49:28 PM By: Geralyn Corwin DO Entered By: Geralyn Corwin on 01/22/2023 13:38:11 -------------------------------------------------------------------------------- Physical Exam Details Patient Name: Date of Service: [name], [name] 01/22/2023 1:00 PM Medical Record Number: [id number] Patient Account Number: [id number] Date of Birth/Sex: Treating RN: 02-Oct-1940 (82 y.o. Skip Mayer Primary Care Provider: Beverely Low Other Clinician: Betha Loa Referring Provider: Treating Provider/Extender:  Glendon Axe Weeks in Treatment: 7 Constitutional . Psychiatric . Notes Open wound to the dorsal aspect of the first metatarsal on the right foot with nonviable tissue and exposed bone. Difficult to palpate pedal pulses Electronic Signature(s) SMT, LOKEY ([id number]) [id number] Page 5 of 13 Signed: 01/23/2023 12:49:28 PM By: Geralyn Corwin DO Entered By: Geralyn Corwin on 01/22/2023 13:38:30 -------------------------------------------------------------------------------- Physician Orders Details Patient Name: Date of Service: [name] Drafts White. 01/22/2023 1:00 PM Medical Record Number: [id number] Patient Account Number: [id number] Date of Birth/Sex: Treating RN: [birth year] (82 y.o. Skip Mayer Primary Care Provider: Beverely Low Other Clinician: Betha Loa Referring Provider: Treating Provider/Extender: Osa Craver in Treatment: 7 Verbal / Phone Orders: Yes Clinician: Huel Coventry Read Back and Verified: Yes Diagnosis Coding Follow-up Appointments Return Appointment in 1 week. Bathing/ Shower/ Hygiene May shower with wound dressing protected with water repellent cover or cast protector. Anesthetic (Use 'Patient Medications' Section for Anesthetic Order Entry) Lidocaine applied to wound bed Edema Control - Lymphedema / Segmental Compressive Device / Other Elevate, Exercise Daily and A void Standing for Long Periods of Time. Elevate legs to the level of the heart and pump ankles as often as possible Elevate leg(s) parallel to the floor when sitting. Wound Treatment Wound #12 - Foot Wound Laterality: Dorsal, Right, Distal Cleanser: Soap and Water 1 x Per Day/30 Days Discharge Instructions: Gently cleanse wound with antibacterial soap, rinse and pat dry prior to dressing wounds Prim Dressing: Gauze 1 x Per Day/30 Days ary Discharge Instructions: moistened with Dakins Solution Secondary Dressing: Gauze  1 x Per Day/30 Days Secured With: Medipore T - 34M Medipore H Soft Cloth Surgical T ape ape, 2x2 (in/yd) 1 x Per Day/30 Days Electronic Signature(s) Signed: 01/23/2023 12:49:28 PM By: Geralyn Corwin DO Entered By: Geralyn Corwin on 01/22/2023 13:47:04 Problem List Details -------------------------------------------------------------------------------- [name] ([id number]) [id number] Page 6 of 13 Patient Name: Date of Service: ANAYI, BRICCO 01/22/2023 1:00 PM Medical Record Number: [id number] Patient Account Number: [id number] Date of Birth/Sex: Treating RN: Nov 26, 1940 (82 y.o. Skip Mayer Primary Care Provider: Beverely Low Other Clinician: Betha Loa Referring Provider: Treating Provider/Extender: Osa Craver in Treatment: 7 Active Problems ICD-10 Encounter Code Description Active Date MDM Diagnosis L97.514 Non-pressure chronic ulcer of other part of right foot with necrosis of bone 12/04/2022 No Yes E11.621 Type 2 diabetes mellitus with foot ulcer 12/04/2022 No Yes I73.9 Peripheral vascular disease, unspecified 12/04/2022 No Yes M86.671 Other chronic osteomyelitis, right ankle and foot 01/15/2023 No Yes J44.9 Chronic obstructive pulmonary disease, unspecified 12/04/2022 No Yes Z79.4 Long term (current) use of insulin 12/04/2022 No Yes Inactive Problems Resolved Problems Electronic Signature(s)  Signed: 01/23/2023 12:49:28 PM By: Geralyn Corwin DO Entered By: Geralyn Corwin on 01/22/2023 13:33:55 -------------------------------------------------------------------------------- Progress Note Details Patient Name: Date of Service: [name] Drafts White. 01/22/2023 1:00 PM Medical Record Number: [id number] Patient Account Number: [id number] Date of Birth/Sex: Treating RN: 11/04/1940 (82 y.o. Skip Mayer Primary Care Provider: Beverely Low Other Clinician: Betha Loa Referring Provider: Treating Provider/Extender: Osa Craver in Treatment: 7 Subjective Chief Complaint Information obtained from Patient 12/04/2022; right dorsal foot wound Watton, Delayla White ([id number]) [id number] Page 7 of 13 History of Present Illness (HPI) 08/13/16: This is a 82 year old woman who came predominantly for review of 3 cm in diameter circular wound to the left anterior lateral leg. She was in the ER on 08/01/16 I reviewed their notes. There was apparently pus coming out of the wound at that time and the patient arrived requesting debridement which they don't do in the emergency room. Nevertheless I can't see that they did any x-rays. There were no cultures done. She is a type II diabetic and I a note after the patient was in the clinic that she had a bypass graft from the popliteal to the tibial on the right on 02/28/16. She also had a right greater saphenous vein harvest on the same date for arterial bypass. She is going to have vascular studies including ABIs T ABIs on the right on 08/28/16. The patient's surgery was on 02/28/16 by Dr. Johny Drilling she had a right below the knee popliteal artery to peroneal artery bypass with reverse greater saphenous vein and an endarterectomy of the mid segment peroneal artery. Postoperatively she had a strong mild monophasic peroneal signal with a pink foot. It would appear that the patient is had some nonhealing in the surgical saphenous vein harvest site on the left leg. Surprisingly looking through [facility] I cannot see much information about this at all. Dr. Ruthe Mannan notes from 05/29/16 show that the patient's wounds "are not healed" the right first metatarsal wound healed but then opened back up. 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 At that point the patient had a wound VAC to her right calf with regards to the wounds on her dorsal right toe would  appear that these are felt to be arterial wounds. She has had surgery on the metatarsal phalangeal in 2015 I Dr. Victorino Dike secondary to a right metatarsal phalangeal joint fracture. She is apparently had discoloration around this area since then. 08/28/16; patient arrives with her wounds in much the same condition. The linear vein harvest site and the circular wound below it which I think was a blister. She also has to probing holes in her right great toe and a necrotic eschar on the right second toe. Because of these being arterial wounds I reduced her compression from 3-2 layers this seems to of done satisfactorily she has not had any problems. I cannot see that she is actually had an x-ray ====== 11/06/16 the patient comes in for evaluation of her right lower extremity ulcers. She was here in January 2018 for 2 visits subsequently ended up in the hospital with pneumonia and then to rehabilitation. She has now been discharged from rehabilitation and is home. She has multiple ulcerations to her right lower extremity including the foot and toes. She does have home health in place and they have been placing alginate to the ulcers. She is followed by Dr. Imogene Burn of vascular  medicine. 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 She recently saw him on 3/23. In office ABIs were: Right 0.48 with monophasic flow to the DP, PT peroneal , Left 0.63 with monophasic flow to the DP and PT Her arterial studies indicated a patent right below knee popliteal to peroneal bypass She had an MRI in February 2008 that was negative frosty myelitis but this showed general soft tissue edema in the right foot and lower extremity concerning for cellulitis She is a diabetic, managed with insulin. Her hemoglobin A1c in December 2017 was 8.4 which is a trend up from previous levels. She had blood work in February 2018 which revealed an albumin of 2.6 this appears to  be relatively acute as an albumin in November 2017 was 3.7 11/13/16; this is a patient I have not seen since February who is readmitted to our clinic last week. 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 Imogene Burn. I have reviewed Dr. Nicky Pugh notes from March/23/18. Doppler ABI on that date showed an ABI on the right of 0.48 and on the left of 0.63. Dorsalis pedis waveforms were monophasic bilaterally. There was no waveforms detected at the posterior tibial on the right, monophasic on the left. Dr. Nicky Pugh comments were that this patient would have follow-up vascular studies in 3 months including ABIs and right lower extremity arterial duplex. She had an MRI in February that was negative for osteomyelitis but showed generalized edema in the foot. Last albumin I see was in January at 3.4 we have been using Santyl to the 4 wounds on the right leg the patient is noted today to have widespread edema well up towards her groin this is pitting 2-3+. 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 Normal ejection fraction. 11/20/16; patient has a follow-up appointment with Dr. Imogene Burn on April 23. She is still complaining of a lot of pain in the right foot and right leg. It is not clear to me that this is at all positional however I think it is clear claudication with minimal activity perhaps at rest. At our suggestion she is return to her primary physician's office tomorrow with regards to her pitting lower extremity bilateral edema that I reviewed in detail last week 11/27/16; the follow-up with Dr. Imogene Burn was actually on May 23 on April 23 as I stated in my note last week. N/A case all of her wounds seems somewhat smaller. The 2 on the right leg are definitely smaller. The areas on the dorsal right first toe, right third toe and the lateral part of the right fifth metatarsal head all looks smaller but  have tightly adherent surfaces. We have been using Santyl 12/04/16; follow-up with Dr. Imogene Burn on May 23. 2 small open wounds on the right leg continue to get smaller. The area on the right third total lateral aspect of the right fifth toe also look better. The remaining area on the dorsal first toe still has some depth to it. We have been using Santyl to the toes and collagen on the right 12/11/16; according to patient's husband the follow-up with Dr. Imogene Burn is not until July. All of the wounds on the right leg are measuring smaller. We have been using some combination of Prisma and Santyl although I think we can go to straight Prisma today.  There may have been some confusion with home health about the primary dressing orders here. 12/25/16; the patient has had some healing this week. The area on her right lateral fifth metatarsal head, right third toe are both healed lower right leg is healed. In the vein harvest site superiorly she has one superficial open area. On the dorsal aspect of her right great toe/MTP joint the wound is now divided into 2 however the proximal area is deep and there is palpable bone 01/01/17; still an open area in the middle of her original right surgical scar. The area on the right third toe and right lateral fifth metatarsal head remained closed. Problematic area on the dorsal aspect of the toe. Previous surgery in this area line 01/08/17 small open area on the original scar on her upper anterior leg although this is closing. X-ray I did of the right first toe did not show underlying bony abnormality. Still this area on the dorsal first toe probes to bone. We have been using Prisma 01/15/17; small open area in the original scar in the upper anterior leg is almost fully closed. She has 2 open areas over the dorsal aspect of the right first toe that probes to bone. Used and a form starting last week. Vigorous bone scraping that I did last week showed few methicillin sensitive staph  aureus. She is allergic to penicillin and sulfa drugs. I'm going to give her 2 weeks of doxycycline. She will need an MRI with contrast. 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 She continues on doxycycline for a bone scraping that showed methicillin sensitive staph aureus [allergic to penicillin and sulfa]. We have been using Endo form to the wound. 01/29/17; surprisingly her MRI did not show osteomyelitis. She continues on doxycycline for a bone scraping that showed methicillin sensitive staph aureus with allergies to penicillin and sulfa. We have been using Endo form to the wound. Unfortunately I cannot get a surface on this visit looks like it is able to support a healing state. Proximally there is still exposed bone. There is no overt soft tissue tenderness. Her MRI did show a previous fracture was surgery to this area but no hardware 02/12/17 on evaluation today patient appears to be doing well in regard to her lower extremity wound. She does have some mild discomfort but this is minimal. There's no evidence of infection. Her left great toe nail has somewhat been lifted up and there is a little bit of slight bleeding underneath but this is still firmly attached. 02/19/17; she ran out of doxycycline 2 days ago. Nevertheless I would like to continue this for 2 weeks to make a full 6 weeks of therapy as the bone scraping that I did from the open area on the dorsal right first toe showed MRSA. This should complete antibiotic therapy. 02/26/17; the patient is completing her 6 weeks of oral doxycycline. Deep wound on the dorsal right toe. This still has some exposed bone proximally. She does have a reasonably granulated surface albeit thin over bone. I've been using Endo form have made application for an amniotic skin sub 03/05/17; the patient has completed 6 weeks of doxycycline. This is a deep wound on the dorsal right toe  which has exposed bone proximally. She has granulation over most of this wound albeit a thin layer. I've been using Endo form. Still do not have approval for [employer] 03/13/17  on evaluation today patient's right foot wound appears to be doing better measurement wise compared to her last evaluation. She has been tolerating the dressing change without complication and does have a appointment with the dietary that has been made as far as referral is concerned there just waiting for her and transportation to contact them back for an actual date. Nonetheless patient has been having less pain at this point. No fevers, chills, nausea, or vomiting noted at this time. 03/26/17; linear wound over the dorsal aspect of her right great toe. At one point this had exposed bone on the most superior aspect however I am pleased to see today that this appears to have a surface of granulation. Apparently product was applied for but denied by [facility]. We'll need to see what that was. The patient has known PAD status post revascularization. MRI did not show osteomyelitis which was done in June 04/02/17; continued improvement using Endoform. She was approved for [employer] but hasn't over $200 co-pay per application, this is beyond her means [name], [name] ([id number]) [id number] Page 8 of 13 04/09/17; continues on Endoform. 04/16/17; appears to be doing nicely continuing on Endoform 04/22/17; patient did not have her dressing changed all week because of the weather. Using Endoform. Base of the wound looks healthy elbow there appears to events surrounding maceration perhaps because of the drainage was not changing the wound dressing 04/30/17 wound today continues to close and except for a small divot on the superior part of the wound. This area did not probe the bone but I found this a little concerning as this was the area with exposed bone before we be able to get this to granulate forward. As I remember  things she is not a candidate for skin substitutes secondary to an outlandish co-pay. I asked her husband to look into the out of pocket max for medications if he has 1 [Regranex] or totally for skin substitutes 05/07/17; the patient arrives today with a wound roughly the same size although under careful inspection under the light there appears to be some epithelialization. Her intake nurse noted that the Endoform seemed to be placed over the wound rather than in the deeper divots they could really benefit from the Endoform. At this point I have no plans to change the Endoform as at one point this was at least 33% exposed bone and the rest of the wound very close to the underlying phalanx 05/14/17; no major change in the size or appearance of the wound. We have been using Endoform for a prolonged period of time with reasonably good improvement in the epithelialization i.e. no exposed bone especially proximally. Unless we've not made a lot of changes in the last several weeks. She does not complain of pain. She was revascularized early this year or perhaps late last year by Dr. Imogene Burn and I wonder if he will need to have a look at her, I will need to review the actual vascular procedure which I think was a distal popliteal bypass 05/21/17; switched to either for a blue last week. Patient arrived in clinic with the wound not measuring any better but the surface looking some better. Unfortunately she had some surface slough and when I went to remove this superiorly she had exposed bone. Previously she had had exposed bone in the inferior part of the wound however this is granulated over some weeks ago. Clearly this is a major step back for her. With some difficulty I was able to obtain a  piece of the bone probing through the superior part of the wound for culture. We did not send pathology. It is likely that she is going to need imaging of this site but I did not order this today 05/28/17; bone culture I  took last week showed MSSA. She still has exposed bone however I could not get another piece for pathology. She had an MRI 4 months ago that did not show osteoarthritic at time. Wound rapidly deteriorated superiorly and I suspect this is underlying osteomyelitis. We have been using Hydrofera Blue 06/04/17 on evaluation today patient's wound appears to actually be doing a little bit better visually compared to last week's evaluation which is good news. Nonetheless she does have osteomyelitis of the toe which does not appear to be MRSA. No fevers, chills, nausea, or vomiting noted at this time. She is having no discomfort. 06/11/17; patient's wound looks a little healthier than I remember seeing it 2 weeks ago. There is no exposed bone and the surface of the wound appears well granulated. We've been using hydrofera blue. I note that she was started on doxycycline which was MSSA. Her appointment with Dr. Sampson Goon is on November 12 I believe. 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 Johny Drilling of vascular surgery on 11/9. He offered right leg angiography possible intervention however the patient refused. I've discussed this with the patient's husband today she did not want any further procedures. Also noteworthy that Dr. Johny Drilling stated that this wound had not healed in 3 years, I was not aware of this degree of chronicity in this area. She has underlying osteomyelitis by bone culture. Culture of this grew MSSA, she is allergic to penicillin and therefore not a candidate for beta lactams. She saw Dr. Sampson Goon of infectious disease this morning, he continued her on doxycycline for another month and apparently sent in a prescription. We have been using Hydrofera Blue. ABI's update by Dr. Imogene Burn right 0.62, left 0.89. Monophasic wave forms 06/25/17 on evaluation today patient appears to be doing well in regard to her right great toe wound. She is still taking the doxycycline as prescribed  by Dr. Sampson Goon. The Hydrofera Blue Dressing's to be doing as well as anything has for her up to this point and we are still waiting on an insurance authorization for the Apligraft. She is having no pain which is good news. No fevers, chills, nausea, or vomiting noted at this time. 07/02/17; still taking doxycycline as prescribed by Dr. Sampson Goon. Hydrofera Blue continues. We have no palpable bone. Still have not had had approval of Apligraf 08/06/16; the patient arrives today with [facility] even though we apparently had changed to Sorbact last time. Her co-pay for Regranex is $389 in discussion with the patient and her husband this was excessive. We'll continue with the Sorbact and standard dressings for now 08/20/17; we have been using sorbact. The wound bed still requires debridement. Wound measurements are slightly better. 09/03/17;using Sorbact.no debridement today. Wound measurements slightly better. There is some discoloration of the tip of her toelooks like bruising 09/17/17; using Sorbact. No debridement today. Wound dimensions are about the same. Still some discoloration at the tip of her great toe. This does not look any worse than last time 10/01/17; using sorbact right great toe I thought she might have surface epithelialization last time although it is certainly not look like that today nevertheless her dimensions are better. Continued surface eschar on the tip of her toe but this is not progressing.  She is actually complaining of the left great toe 10/15/17; this is a very difficult area on the dorsal aspect of her proximal right great toe. At one point this wound had exposed bone however we have managed to get some degree of epithelialization. She was revascularized by Dr. Johny Drilling in [place]. She saw Dr. Sampson Goon for underlying osteomyelitis. She is not a candidate for advanced treatment options [insurance issues]. She comes in today with a new area on the tip of her right great  toe. The exact history here is unclear. We have been using sorbact 10/29/17; patient arrives today with very significant bilateral increase edema which appears to extend into the thighs. This is tight and not particularly pitting however it looks a lot more impressive than I remember. Not surprisingly the wounds on her right great toe have increased in size with weeping edema fluid as the edema extends into the dorsal foot itself. She also has a wound on the tip of her right great toe which is a new wound from 2 weeks ago. We have been using sorbact. 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 He is on Demadex presumably twice a day at 20 mg. I have asked her family to make her an appointment with her primary physicians at the [facility] to go over this degree of edema. This is causing I think deterioration in the right great toe wound. She also has significant PAD and is status post right leg bypass graft. I think this was done by [facility]. I believe they also said that there was nothing further that could be done with regards to her vascular status. 11/12/17; patient is going to see her primary doctor this afternoon with regards to lower extremity edema. She has 2 open wounds on the right great toe the original wound on the dorsal proximal part and then a more necrotic looking area on the tip of her right great toe. We took some time today to review her vascular status. The patient had a relate below knee popliteal to peroneal artery bypass with reversed greater saphenous vein on 06/13/17; she also had endarterectomy of the midsegment of the peroneal artery. 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 I think at some point in time after that she was offered an  arteriogram on the right but she declined. I've used Silver collagen to her wounds recently. 11/26/17; patient has 2 open wounds on the right great toe tip as well as the dorsal aspect of her right great toe. She has known PAD. I've been trying to get her back to see vein and vascular in [facility] to see if there are options for endovascular surgery to help with perfusion although the patient and her husband may not want any more surgery. We've been using silver alginate because of maceration. She does not have an option for advanced treatment/skin substitutes 12/10/17; continued open wounds much the same as 2 weeks ago on the dorsal aspect of the right great toe on the right great toe tip. I've encouraged to follow- up with Dr. Imogene Burn of vein and vascular to explore the possibility that there may be correctable ischemia involved in nonhealing these areas. We made considerable progress on the dorsal right great toe however it is stalled recently. We do not have an advanced treatment  option. Originally this had exposed bone however there is a granulated base. 12/24/17; patient went back to see vein and vascular in [place]. She was seen by Langston Reusing NP. ABIs on the right were noted to be 0.84 and TBI of the right and 0.62. On the left her ABI was 0.82 and her TBI of 0.75. Waveforms were monophasic at the posterior tibial and dorsalis pedis bilaterally. 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 Her husband reiterates today that she does not want an arteriogram therefore I'm not going to press the issue. We've been using silver collagen to the wounds on the tip of the right great toe and dorsal surface of the right great toe. [name], RENTFROW ([id number]) [id number] Page 9 of 13 01/07/18; patient  arrives today with the tip of her toe healed. An cerebral amount of slough over the wound on the dorsal toe. As noted on 12/24/17 no options currently for revascularization the patient will not allow an arteriogram 01/21/18; typical of her right great toe remains healed. Still large amount of slough over the wound on the dorsal toe. No options for revascularization. We've been using Medihoney 02/04/18; no major change. Still a punched out area on the right great toe dorsal wound. Covered in a difficult necrotic debris. 02/18/18; no major change. Still a punched out area over the right great toe. They're using medihoney 03/04/18; no major change. The wound is stalled. Still a punched out area over the right dorsal great toe. We've been using medihone for about a month not much change in the wound dimensions or the characteristics of the wound surface. We've previously used Iodoflex, Hydrofera Blue, sorbact, and I think at one point [place]. She has PAD and does not have any revascularization options although a wound distally on the tip of the great toe did close over 03/25/18; absolutely no change. Still adherent debris over the wound surface there is no palpable bone but the wound still has some depth. This is predominantly a arterial wound and she basically has refused further attempts at revascularization. I would consider this a totally palliative situation except for she managed to heal the wound at the tip of the same toe some months ago. I changed her back to Iodoflex The other issue is that [facility] has called and wants to pare back on the visits which have been 3 times a week. They will drop to 2 times a week next week 04/29/18; since the patient was last here she was admitted to hospital from 9/6 through 9/13. She was noted to have altered LOC, declining ability to walk. She is signed out as having a lower urinary tract infection and acute metabolic encephalopathy secondary to a UTI,  hypercapnic respiratory failure secondary to COPD with sleep apnea. She was discharged to [facility]. She is on oxygen chronically at 3 L. I don't think they've been doing anything specific to the wound. Her husband laments that she is no longer able to stand and walk or transfer. Yet he seems determined to take her home 05/13/18; 2 week follow-up. The patient is still at [facility]. She arrives lethargic in clinic. She was also confused during her index hospitalization. She was felt to have metabolic encephalopathy secondary to UTI. Although she also had hypercapnic respiratory failure secondary to COPD and  sleep apnea. She is on chronic oxygen. Paradoxically her wound actually looks a lot better and this is really filled in nicely. We're using silver collagen 06/03/18 3 week follow-up. [facility] but was supposed to be discharged home today. She arrives in clinic virtually unresponsive. Her wound is actually healed 12/04/2022 [name] is an 82 year old female with a past medical history of insulin-dependent type 2 diabetes, peripheral arterial disease and COPD that presents the clinic for a right foot wound. She states this happened spontaneously about 4 weeks ago. Unfortunately there is bone exposed today. She has been keeping the area covered. She denies systemic signs of infection. 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 She follows with vein and vascular for peripheral arterial disease. On 04/2021 she had a right lower extremity arteriogram with stent placement in the right superficial femoral and popliteal arteries and right anterior tibial artery. She last saw them in February 2024. At that time she did not have wounds. 5/8; this is a patient is been in the clinic at variable times through the years. She has known type 2 diabetes with severe PAD. She was followed for a period of time with Dr. Gilda Crease. She has a wound on  the dorsal aspect of the right foot just proximal to the metatarsal head. This probes easily to bone. Culture last week showed methicillin sensitive Staph aureus. This was shown to to [employer] and doxycycline was ordered. The patient is allergic to penicillin. She also had an x-ray done but I do not have these results. For reasons that are not totally clear the doxycycline has not started I made it clear to her husband that needed to start this tonight. The patient has a deep probing wound to bone. She has hardware in this area as well. This goes back to 2015 when she had a metatarsal fracture repaired by Dr. Victorino Dike 5/15; patient presents for follow-up. Son is present during the encounter. She has been taking doxycycline for the past 7 days. X-ray did not show radiographic evidence of osteomyelitis however she does have obvious bone exposed. Will order an MRI. She has been using Vashe wet-to-dry dressings. Currently denies systemic signs of infection. Discussed potentially doing hyperbaric oxygen therapy. Patient and son will think about it. 6/1. Since the patient was last here she had an MRI on 5/24 and noninvasive arterial studies on 5/29. The arterial study showed a ABI of 0.85 on the right with monophasic waveforms unfortunately they did not do TBI's on the right because of the wound in this location. On the left ABI was 1.09 with monophasic waveforms her toe on the left is amputated. MRI of the right foot showed underlying marrow edema suspicious for early osteomyelitis. We have been using Vashe wet-to-dry to the wounds which her husband is changing 6/12; patient Presents for follow-up. She saw vein and vascular on 5/29 and they recommended an angiogram however patient and husband declined During that visit. Patient's husband is present today. He states that he has been waiting for a phone call from vein and vascular that is why he has not followed up. She denies systemic signs of infection.  She has been using Vashe wet-to-dry dressings. 6/19; patient presents for follow-up. Plan is for lower extremity angiogram by vein and vascular on 6/25. She continues to use Vashe wet-to-dry dressings to the open wound. She complains of chronic pain. Other than that she has no acute changes  since last visit. Objective Constitutional Vitals Time Taken: 1:04 PM, Height: 64 in, Weight: 145 lbs, BMI: 24.9, Temperature: 98.1 F, Pulse: 71 bpm, Respiratory Rate: 18 breaths/min, Blood Pressure: 112/75 mmHg. General Notes: Open wound to the dorsal aspect of the first metatarsal on the right foot with nonviable tissue and exposed bone. Difficult to palpate pedal pulses Integumentary (Hair, Skin) Wound #12 status is Open. Original cause of wound was Gradually Appeared. The date acquired was: 11/04/2022. The wound has been in treatment 7 weeks. The wound is located on the Right,Distal,Dorsal Foot. The wound measures 1.2cm length x 0.8cm width x 0.4cm depth; 0.754cm^2 area and 0.302cm^3 volume. There is bone and Fat Layer (Subcutaneous Tissue) exposed. There is a medium amount of serosanguineous drainage noted. There is small (1-33%) pink granulation within the wound bed. There is a large (67-100%) amount of necrotic tissue within the wound bed including Adherent Slough. TOMASA, DOBRANSKY ([id number]) [id number] Page 10 of 13 Assessment Active Problems ICD-10 Non-pressure chronic ulcer of other part of right foot with necrosis of bone Type 2 diabetes mellitus with foot ulcer Peripheral vascular disease, unspecified Other chronic osteomyelitis, right ankle and foot Chronic obstructive pulmonary disease, unspecified Long term (current) use of insulin Patient's wound is stable. She is scheduled for an angiogram in 1 week. I recommended continuing Vashe wet-to-dry dressings. I will see back in 2 weeks. Plan Follow-up Appointments: Return Appointment in 1 week. Bathing/ Shower/  Hygiene: May shower with wound dressing protected with water repellent cover or cast protector. Anesthetic (Use 'Patient Medications' Section for Anesthetic Order Entry): Lidocaine applied to wound bed Edema Control - Lymphedema / Segmental Compressive Device / Other: Elevate, Exercise Daily and Avoid Standing for Long Periods of Time. Elevate legs to the level of the heart and pump ankles as often as possible Elevate leg(s) parallel to the floor when sitting. WOUND #12: - Foot Wound Laterality: Dorsal, Right, Distal Cleanser: Soap and Water 1 x Per Day/30 Days Discharge Instructions: Gently cleanse wound with antibacterial soap, rinse and pat dry prior to dressing wounds Prim Dressing: Gauze 1 x Per Day/30 Days ary Discharge Instructions: moistened with Dakins Solution Secondary Dressing: Gauze 1 x Per Day/30 Days Secured With: Medipore T - 72M Medipore H Soft Cloth Surgical T ape ape, 2x2 (in/yd) 1 x Per Day/30 Days 1. Vashe wet-to-dry dressings 2. Follow-up in 2 weeks Electronic Signature(s) Signed: 01/23/2023 12:49:28 PM By: Geralyn Corwin DO Entered By: Geralyn Corwin on 01/22/2023 13:46:38 -------------------------------------------------------------------------------- ROS/PFSH Details Patient Name: Date of Service: [name] Drafts White. 01/22/2023 1:00 PM Medical Record Number: [id number] Patient Account Number: [id number] Date of Birth/Sex: Treating RN: 1940-09-10 (82 y.o. Skip Mayer Primary Care Provider: Beverely Low Other Clinician: Betha Loa Referring Provider: Treating Provider/Extender: Osa Craver in Treatment: 7 Information Obtained From Patient Eyes Medical History: Positive for: Cataracts - removed Ciccone, Mahitha White ([id number]) [id number] Page 11 of 13 Negative for: Glaucoma; Optic Neuritis Ear/Nose/Mouth/Throat Medical History: Positive for: Chronic sinus problems/congestion Negative for: Middle ear  problems Hematologic/Lymphatic Medical History: Negative for: Anemia; Hemophilia; Human Immunodeficiency Virus; Lymphedema; Sickle Cell Disease Respiratory Medical History: Negative for: Aspiration; Asthma; Chronic Obstructive Pulmonary Disease (COPD); Pneumothorax; Sleep Apnea; Tuberculosis Cardiovascular Medical History: Positive for: Congestive Heart Failure; Hypertension Negative for: Angina; Arrhythmia; Coronary Artery Disease; Deep Vein Thrombosis; Hypotension; Myocardial Infarction; Peripheral Arterial Disease; Peripheral Venous Disease; Phlebitis Past Medical History Notes: Stroke 2017 Gastrointestinal Medical History: Negative for: Cirrhosis ; Colitis; Crohns; Hepatitis A; Hepatitis B; Hepatitis C Endocrine Medical  History: Positive for: Type II Diabetes Negative for: Type I Diabetes Time with diabetes: 13 years Treated with: Insulin Blood sugar tested every day: Yes Tested : 2 Blood sugar testing results: Breakfast: 202 Genitourinary Medical History: Negative for: End Stage Renal Disease Immunological Medical History: Negative for: Lupus Erythematosus; Raynauds; Scleroderma Integumentary (Skin) Medical History: Negative for: History of Burn; History of pressure wounds Musculoskeletal Medical History: Negative for: Gout; Rheumatoid Arthritis; Osteoarthritis; Osteomyelitis Neurologic Medical History: Negative for: Dementia; Neuropathy; Quadriplegia; Paraplegia; Seizure Disorder Oncologic Medical History: Negative for: Received Chemotherapy; Received Radiation Psychiatric Medical History: Negative for: Lanney Gins Anxiety Redwine, Keymani White ([id number]) [id number] Page 12 of 13 HBO Extended History Items Ear/Nose/Mouth/Throat: Eyes: Chronic sinus Cataracts problems/congestion Immunizations Pneumococcal Vaccine: Received Pneumococcal Vaccination: Yes Received Pneumococcal Vaccination On or After 60th Birthday:  No Implantable Devices No devices added Hospitalization / Surgery History Type of Hospitalization/Surgery Double Pneumonia Family and Social History Never smoker; Marital Status - Married; Alcohol Use: Never; Drug Use: No History; Caffeine Use: Daily Electronic Signature(s) Signed: 01/23/2023 12:49:28 PM By: Geralyn Corwin DO Signed: 01/27/2023 9:09:50 AM By: Elliot Gurney, BSN, RN, CWS, Kim RN, BSN Entered By: Geralyn Corwin on 01/22/2023 13:47:14 -------------------------------------------------------------------------------- SuperBill Details Patient Name: Date of Service: [name] 01/22/2023 Medical Record Number: [id number] Patient Account Number: [id number] Date of Birth/Sex: Treating RN: March 10, 1941 (82 y.o. Cathlean Cower, Kim Primary Care Provider: Beverely Low Other Clinician: Betha Loa Referring Provider: Treating Provider/Extender: Osa Craver in Treatment: 7 Diagnosis Coding ICD-10 Codes Code Description [id number] Non-pressure chronic ulcer of other part of right foot with necrosis of bone E11.621 Type 2 diabetes mellitus with foot ulcer I73.9 Peripheral vascular disease, unspecified M86.671 Other chronic osteomyelitis, right ankle and foot J44.9 Chronic obstructive pulmonary disease, unspecified Z79.4 Long term (current) use of insulin Facility Procedures : CPT4 Code: [id number] Description: 99213 - WOUND CARE VISIT-LEV 3 EST PT Modifier: Quantity: 1 Physician Procedures : CPT4 Code Description Modifier [id number] 99213 - WC PHYS LEVEL 3 - EST PT ICD-10 Diagnosis Description L97.514 Non-pressure chronic ulcer of other part of right foot with necrosis of bone E11.621 Type 2 diabetes mellitus with foot ulcer Sheerin, Devaeh White  ([id number]) [id number] I73.9 Peripheral vascular disease, unspecified M86.671 Other chronic osteomyelitis, right ankle and foot Quantity: 1 f Page 13 of 13 Electronic Signature(s) Signed: 01/23/2023  12:49:28 PM By: Geralyn Corwin DO Entered By: Geralyn Corwin on 01/22/2023 13:46:54

## 2023-01-27 NOTE — Progress Notes (Addendum)
[name], [name] ([id number]) [id number] Page 1 of 9 Visit Report for 01/22/2023 Arrival Information Details Patient Name: Date of Service: [name], White 01/22/2023 1:00 PM Medical Record Number: [id number] Patient Account Number: [id number] Date of Birth/Sex: Treating RN: [birth year] (82 y.o. Skip Mayer Primary Care : Beverely Low Other Clinician: Betha Loa Referring : Treating /Extender: Osa Craver in Treatment: 7 Visit Information History Since Last Visit All ordered tests and consults were completed: No Patient Arrived: Wheel Chair Added or deleted any medications: No Arrival Time: 12:59 Any new allergies or adverse reactions: No Transfer Assistance: None Had a fall or experienced change in No Patient Identification Verified: Yes activities of daily living that may affect Secondary Verification Process Completed: Yes risk of falls: Patient Requires Transmission-Based No Signs or symptoms of abuse/neglect since last visito No Precautions: Hospitalized since last visit: No Patient Has Alerts: Yes Implantable device outside of the clinic excluding No Patient Alerts: Patient on Blood Thinner cellular tissue based products placed in the center R ABI 1.32 TBI .85 2/28/2 since last visit: L ABI 1.09 TBI amp Has Dressing in Place as Prescribed: Yes 2/28/2 Pain Present Now: Yes Electronic Signature(s) Signed: 01/23/2023 5:12:55 PM By: Betha Loa Entered By: Betha Loa on 01/22/2023 13:03:39 -------------------------------------------------------------------------------- Clinic Level of Care Assessment Details Patient Name: Date of Service: [name], [name] 01/22/2023 1:00 PM Medical Record Number: [id number] Patient Account Number: [id number] Date of Birth/Sex: Treating RN: 06-23-1941 (82 y.o. Skip Mayer Primary Care : Beverely Low Other Clinician: Betha Loa Referring  : Treating /Extender: Osa Craver in Treatment: 7 Clinic Level of Care Assessment Items TOOL 4 Quantity Score []  - 0 Use when only an EandM is performed on FOLLOW-UP visit ASSESSMENTS - Nursing Assessment / Reassessment X- 1 10 Reassessment of Co-morbidities (includes updates in patient status) X- 1 5 Reassessment of Adherence to Treatment Plan [name], White ([id number]) [id number] Page 2 of 9 ASSESSMENTS - Wound and Skin A ssessment / Reassessment X - Simple Wound Assessment / Reassessment - one wound 1 5 []  - 0 Complex Wound Assessment / Reassessment - multiple wounds []  - 0 Dermatologic / Skin Assessment (not related to wound area) ASSESSMENTS - Focused Assessment []  - 0 Circumferential Edema Measurements - multi extremities []  - 0 Nutritional Assessment / Counseling / Intervention []  - 0 Lower Extremity Assessment (monofilament, tuning fork, pulses) []  - 0 Peripheral Arterial Disease Assessment (using hand held doppler) ASSESSMENTS - Ostomy and/or Continence Assessment and Care []  - 0 Incontinence Assessment and Management []  - 0 Ostomy Care Assessment and Management (repouching, etc.) PROCESS - Coordination of Care X - Simple Patient / Family Education for ongoing care 1 15 []  - 0 Complex (extensive) Patient / Family Education for ongoing care []  - 0 Staff obtains Chiropractor, Records, T Results / Process Orders est []  - 0 Staff telephones HHA, Nursing Homes / Clarify orders / etc []  - 0 Routine Transfer to another Facility (non-emergent condition) []  - 0 Routine Hospital Admission (non-emergent condition) []  - 0 New Admissions / Manufacturing engineer / Ordering NPWT Apligraf, etc. , []  - 0 Emergency Hospital Admission (emergent condition) X- 1 10 Simple Discharge Coordination []  - 0 Complex (extensive) Discharge Coordination PROCESS - Special Needs []  - 0 Pediatric / Minor Patient  Management []  - 0 Isolation Patient Management []  - 0 Hearing / Language / Visual special needs []  - 0 Assessment of Community assistance (transportation, D/C planning, etc.) []  - 0  Additional assistance / Altered mentation []  - 0 Support Surface(s) Assessment (bed, cushion, seat, etc.) INTERVENTIONS - Wound Cleansing / Measurement X - Simple Wound Cleansing - one wound 1 5 []  - 0 Complex Wound Cleansing - multiple wounds X- 1 5 Wound Imaging (photographs - any number of wounds) []  - 0 Wound Tracing (instead of photographs) X- 1 5 Simple Wound Measurement - one wound []  - 0 Complex Wound Measurement - multiple wounds INTERVENTIONS - Wound Dressings []  - 0 Small Wound Dressing one or multiple wounds X- 1 15 Medium Wound Dressing one or multiple wounds []  - 0 Large Wound Dressing one or multiple wounds []  - 0 Application of Medications - topical []  - 0 Application of Medications - injection INTERVENTIONS - Miscellaneous []  - 0 External ear exam [name] ([id number]) [id number] Page 3 of 9 []  - 0 Specimen Collection (cultures, biopsies, blood, body fluids, etc.) []  - 0 Specimen(s) / Culture(s) sent or taken to Lab for analysis []  - 0 Patient Transfer (multiple staff / Michiel Sites Lift / Similar devices) []  - 0 Simple Staple / Suture removal (25 or less) []  - 0 Complex Staple / Suture removal (26 or more) []  - 0 Hypo / Hyperglycemic Management (close monitor of Blood Glucose) []  - 0 Ankle / Brachial Index (ABI) - do not check if billed separately X- 1 5 Vital Signs Has the patient been seen at the hospital within the last three years: Yes Total Score: 80 Level Of Care: New/Established - Level 3 Electronic Signature(s) Signed: 01/23/2023 5:12:55 PM By: Betha Loa Entered By: Betha Loa on 01/22/2023 13:27:25 -------------------------------------------------------------------------------- Encounter Discharge Information  Details Patient Name: Date of Service: [name] Guard. 01/22/2023 1:00 PM Medical Record Number: [id number] Patient Account Number: [id number] Date of Birth/Sex: Treating RN: 02/05/1941 (82 y.o. Skip Mayer Primary Care : Beverely Low Other Clinician: Betha Loa Referring : Treating /Extender: Osa Craver in Treatment: 7 Encounter Discharge Information Items Discharge Condition: Stable Ambulatory Status: Wheelchair Discharge Destination: Home Transportation: Other Accompanied By: husband Schedule Follow-up Appointment: Yes Clinical Summary of Care: Electronic Signature(s) Signed: 01/23/2023 5:12:55 PM By: Betha Loa Entered By: Betha Loa on 01/22/2023 13:36:29 Lower Extremity Assessment Details -------------------------------------------------------------------------------- [name] ([id number]) [id number] Page 4 of 9 Patient Name: Date of Service: [name], [name] 01/22/2023 1:00 PM Medical Record Number: [id number] Patient Account Number: [id number] Date of Birth/Sex: Treating RN: 1941/01/28 (82 y.o. Skip Mayer Primary Care : Beverely Low Other Clinician: Betha Loa Referring : Treating /Extender: Glendon Axe Weeks in Treatment: 7 Edema Assessment Left: Right: Assessed: No Yes Edema: Yes Calf Left: Right: Point of Measurement: 30 cm From Medial Instep 28 cm Ankle Left: Right: Point of Measurement: 10 cm From Medial Instep 19.6 cm Vascular Assessment Left: Right: Pulses: Dorsalis Pedis Palpable: Yes Electronic Signature(s) Signed: 01/23/2023 5:12:55 PM By: Betha Loa Signed: 01/27/2023 9:09:50 AM By: Elliot Gurney, BSN, RN, CWS, Kim RN, BSN Entered By: Betha Loa on 01/22/2023 13:15:57 -------------------------------------------------------------------------------- Multi Wound Chart Details Patient Name: Date of  Service: [name] Guard. 01/22/2023 1:00 PM Medical Record Number: [id number] Patient Account Number: [id number] Date of Birth/Sex: Treating RN: [birth year] (82 y.o. 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: Extremity Cataracts, Chronic sinus N/A N/A Comorbid History: problems/congestion, Congestive Heart Failure, Hypertension, Type II Diabetes 11/04/2022 N/A N/A Date Acquired: 7 N/A N/A Weeks of Treatment: Open N/A N/A Wound Status: No N/A N/A Wound Recurrence: Yes N/A N/A Pending A mputation on Presentation: 1.2x0.8x0.4 N/A N/A Measurements L x W x D (cm) 0.754 N/A N/A A (cm) : rea 0.302 N/A N/A Volume (cm) : -301.10% N/A N/A % Reduction in A rea: -221.30% N/A N/A % Reduction in Volume: Grade 2 N/A N/A Classification: Medium N/A N/A Exudate A mount: Serosanguineous N/A N/A Exudate Type: red, brown N/A N/A Exudate Color: Small (1-33%) N/A N/A Granulation A mount: Pink N/A N/A Granulation Quality: Large (67-100%) N/A N/A Necrotic A mount: Fat Layer (Subcutaneous Tissue): Yes N/A N/A Exposed Structures: Bone: Yes Fascia: No Tendon: No Muscle: No Joint: No None N/A N/A Epithelialization: Treatment Notes Electronic Signature(s) Signed: 01/23/2023 5:12:55 PM By: Betha Loa Entered By: Betha Loa on 01/22/2023  13:16:05 -------------------------------------------------------------------------------- Multi-Disciplinary Care Plan Details Patient Name: Date of Service: [name] Guard. 01/22/2023 1:00 PM Medical Record Number: [id number] Patient Account Number: [id number] Date of Birth/Sex: Treating RN: 1941-04-26 (82 y.o. Skip Mayer Primary Care : Beverely Low Other Clinician: Betha Loa Referring : Treating /Extender: Osa Craver in Treatment: 7 Active Inactive Electronic Signature(s) Signed: 03/16/2023 5:47:08 PM By: Elliot Gurney, BSN, RN, CWS, Kim RN, BSN Previous Signature: 03/16/2023 5:45:56 PM Version By: Elliot Gurney BSN, RN, CWS, Kim RN, BSN Previous Signature: 01/23/2023 5:12:55 PM Version By: Betha Loa Previous Signature: 01/27/2023 9:09:50 AM Version By: Elliot Gurney, BSN, RN, CWS, Kim RN, BSN Entered By: Elliot Gurney, BSN, RN, CWS, Kim on 03/16/2023 17:47:08 Stauch, [name] Bonier ([id number]) [id number] Page 6 of 9 -------------------------------------------------------------------------------- Pain Assessment Details Patient Name: Date of Service: [name], [name] 01/22/2023 1:00 PM Medical Record Number: [id number] Patient Account Number: [id number] Date of Birth/Sex: Treating RN: [birth year] (82 y.o. Skip Mayer Primary Care : Beverely Low Other Clinician: Betha Loa Referring : Treating /Extender: Osa Craver in Treatment: 7 Active Problems Location of Pain Severity and Description of Pain Patient Has Paino Yes Site Locations Pain Location: Pain in Ulcers Pain Management and Medication Current Pain Management: Notes Patient unable to give rate of pain. She states "it hurts badActuary) Signed: 01/23/2023 5:12:55 PM By: Betha Loa Signed: 01/27/2023 9:09:50 AM By: Elliot Gurney, BSN, RN, CWS, Kim RN, BSN Entered By: Betha Loa on 01/22/2023  13:07:04 -------------------------------------------------------------------------------- Patient/Caregiver Education Details Patient Name: Date of Service: [name], [name] 6/19/2024andnbsp1:00 PM Medical Record Number: [id number] Patient Account Number: [id number] Date of Birth/Gender: Treating RN: [birth year] (82 y.o. Skip Mayer Primary Care Physician: Beverely Low Other Clinician: Shenay, Voltz ([id number]) [id number] Page 7 of 9 Referring Physician: Treating Physician/Extender: Osa Craver in Treatment: 7 Education Assessment Education Provided To: Patient Education Topics Provided Wound/Skin Impairment: Handouts: Other: continue wound care as directed Methods: Explain/Verbal Responses: State content correctly Electronic Signature(s) Signed: 01/23/2023 5:12:55 PM By: Betha Loa Entered By: Betha Loa on 01/22/2023 13:28:28 -------------------------------------------------------------------------------- Wound Assessment Details Patient Name: Date of Service: [name] Guard. 01/22/2023 1:00 PM Medical Record Number: [id number] Patient Account Number: [id number] Date of Birth/Sex: Treating RN: 19-Jul-1941 (82 y.o. Skip Mayer Primary Care : Beverely Low Other Clinician: Betha Loa Referring : Treating /Extender: Mikey Bussing,  [place], Michelle Nasuti Weeks in Treatment: 7 Wound Status Wound Number: 12 Primary Diabetic Wound/Ulcer of the Lower Extremity Etiology: Wound Location: Right, Distal, Dorsal Foot Wound Open Wounding Event: Gradually Appeared Status: Date Acquired: 11/04/2022 Comorbid Cataracts, Chronic sinus problems/congestion, Congestive Heart Weeks Of Treatment: 7 History: Failure, Hypertension, Type II Diabetes Clustered Wound: No Pending Amputation On Presentation Photos Wound Measurements Length: (cm) 1 Width: (cm) 0 Depth: (cm) 0 Area: (cm) Volume:  (cm) [name], [name] ([id number]) Wound Description Classification: Grade 2 Exudate Amount: Medium Exudate Type: Serosanguineous Exudate Color: red, brown Foul Odor After Cleansing: Slough/Fibrino .2 % Reduction in Area: -301.1% .8 % Reduction in Volume: -221.3% .4 Epithelialization: None 0.754 0.302 [id number] Page 8 of 9 No Yes Wound Bed Granulation Amount: Small (1-33%) Exposed Structure Granulation Quality: Pink Fascia Exposed: No Necrotic Amount: Large (67-100%) Fat Layer (Subcutaneous Tissue) Exposed: Yes Necrotic Quality: Adherent Slough Tendon Exposed: No Muscle Exposed: No Joint Exposed: No Bone Exposed: Yes Treatment Notes Wound #12 (Foot) Wound Laterality: Dorsal, Right, Distal Cleanser Soap and Water Discharge Instruction: Gently cleanse wound with antibacterial soap, rinse and pat dry prior to dressing wounds Peri-Wound Care Topical Primary Dressing Gauze Discharge Instruction: moistened with Dakins Solution Secondary Dressing Gauze Secured With Medipore T - 31M Medipore H Soft Cloth Surgical T ape ape, 2x2 (in/yd) Compression Wrap Compression Stockings Add-Ons Electronic Signature(s) Signed: 01/23/2023 5:12:55 PM By: Betha Loa Signed: 01/27/2023 9:09:50 AM By: Elliot Gurney, BSN, RN, CWS, Kim RN, BSN Entered By: Betha Loa on 01/22/2023 13:14:51 -------------------------------------------------------------------------------- Vitals Details Patient Name: Date of Service: [name] Drafts White. 01/22/2023 1:00 PM Medical Record Number: [id number] Patient Account Number: [id number] Date of Birth/Sex: Treating RN: 15-Aug-1940 (82 y.o. Cathlean Cower, Kim Primary Care : Beverely Low Other Clinician: Betha Loa Referring : Treating /Extender: Osa Craver in Treatment: 7 Vital Signs Time Taken: 13:04 Temperature (F): 98.1 Height (in): 64 Pulse (bpm): 71 Weight (lbs): 145 Respiratory  Rate (breaths/min): 18 Body Mass Index (BMI): 24.9 Blood Pressure (mmHg): 112/75 Maney, [name]) [id number] Page 9 of 9 Reference Range: 80 - 120 mg / dl Electronic Signature(s) Signed: 01/23/2023 5:12:55 PM By: Betha Loa Entered By: Betha Loa on 01/22/2023 13:07:00

## 2023-01-28 ENCOUNTER — Emergency Department (HOSPITAL_COMMUNITY)
Admission: EM | Admit: 2023-01-28 | Discharge: 2023-01-29 | Disposition: A | Discharge status: Home / Self Care | Payer: Medicare HMO | Attending: Emergency Medicine | Admitting: Emergency Medicine

## 2023-01-28 ENCOUNTER — Ambulatory Visit
Admission: RE | Admit: 2023-01-28 | Discharge: 2023-01-28 | Disposition: A | Discharge status: Home / Self Care | Payer: Medicare HMO | Attending: Vascular Surgery | Admitting: Vascular Surgery

## 2023-01-28 ENCOUNTER — Encounter
Admission: RE | Disposition: A | Discharge status: Home / Self Care | Payer: Self-pay | Source: Home / Self Care | Attending: Vascular Surgery

## 2023-01-28 ENCOUNTER — Other Ambulatory Visit: Payer: Self-pay

## 2023-01-28 ENCOUNTER — Encounter (HOSPITAL_COMMUNITY): Payer: Self-pay

## 2023-01-28 ENCOUNTER — Encounter: Payer: Self-pay | Admitting: Vascular Surgery

## 2023-01-28 DIAGNOSIS — I700 Atherosclerosis of aorta: Secondary | ICD-10-CM

## 2023-01-28 DIAGNOSIS — E1151 Type 2 diabetes mellitus with diabetic peripheral angiopathy without gangrene: Secondary | ICD-10-CM | POA: Insufficient documentation

## 2023-01-28 DIAGNOSIS — E119 Type 2 diabetes mellitus without complications: Secondary | ICD-10-CM | POA: Insufficient documentation

## 2023-01-28 DIAGNOSIS — Z7901 Long term (current) use of anticoagulants: Secondary | ICD-10-CM | POA: Insufficient documentation

## 2023-01-28 DIAGNOSIS — S71002A Unspecified open wound, left hip, initial encounter: Secondary | ICD-10-CM

## 2023-01-28 DIAGNOSIS — Z79899 Other long term (current) drug therapy: Secondary | ICD-10-CM | POA: Diagnosis not present

## 2023-01-28 DIAGNOSIS — Z8249 Family history of ischemic heart disease and other diseases of the circulatory system: Secondary | ICD-10-CM | POA: Insufficient documentation

## 2023-01-28 DIAGNOSIS — I110 Hypertensive heart disease with heart failure: Secondary | ICD-10-CM | POA: Insufficient documentation

## 2023-01-28 DIAGNOSIS — Z7982 Long term (current) use of aspirin: Secondary | ICD-10-CM | POA: Insufficient documentation

## 2023-01-28 DIAGNOSIS — Z794 Long term (current) use of insulin: Secondary | ICD-10-CM | POA: Insufficient documentation

## 2023-01-28 DIAGNOSIS — I509 Heart failure, unspecified: Secondary | ICD-10-CM | POA: Insufficient documentation

## 2023-01-28 DIAGNOSIS — E11621 Type 2 diabetes mellitus with foot ulcer: Secondary | ICD-10-CM | POA: Insufficient documentation

## 2023-01-28 DIAGNOSIS — T82856A Stenosis of peripheral vascular stent, initial encounter: Secondary | ICD-10-CM

## 2023-01-28 DIAGNOSIS — L7622 Postprocedural hemorrhage and hematoma of skin and subcutaneous tissue following other procedure: Secondary | ICD-10-CM | POA: Insufficient documentation

## 2023-01-28 DIAGNOSIS — L97919 Non-pressure chronic ulcer of unspecified part of right lower leg with unspecified severity: Secondary | ICD-10-CM

## 2023-01-28 DIAGNOSIS — I70239 Atherosclerosis of native arteries of right leg with ulceration of unspecified site: Secondary | ICD-10-CM

## 2023-01-28 DIAGNOSIS — L97516 Non-pressure chronic ulcer of other part of right foot with bone involvement without evidence of necrosis: Secondary | ICD-10-CM | POA: Diagnosis not present

## 2023-01-28 DIAGNOSIS — I70299 Other atherosclerosis of native arteries of extremities, unspecified extremity: Secondary | ICD-10-CM

## 2023-01-28 DIAGNOSIS — I70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 of foot: Secondary | ICD-10-CM | POA: Diagnosis not present

## 2023-01-28 DIAGNOSIS — E1142 Type 2 diabetes mellitus with diabetic polyneuropathy: Secondary | ICD-10-CM | POA: Insufficient documentation

## 2023-01-28 DIAGNOSIS — Z833 Family history of diabetes mellitus: Secondary | ICD-10-CM | POA: Insufficient documentation

## 2023-01-28 HISTORY — PX: LOWER EXTREMITY ANGIOGRAPHY: CATH118251

## 2023-01-28 LAB — CBC WITH DIFFERENTIAL/PLATELET
Abs Immature Granulocytes: 0.05 10*3/uL (ref 0.00–0.07)
Basophils Absolute: 0 10*3/uL (ref 0.0–0.1)
Basophils Relative: 0 %
Eosinophils Absolute: 0 10*3/uL (ref 0.0–0.5)
Eosinophils Relative: 0 %
HCT: 34.2 % — ABNORMAL LOW (ref 36.0–46.0)
Hemoglobin: 10.2 g/dL — ABNORMAL LOW (ref 12.0–15.0)
Immature Granulocytes: 0 %
Lymphocytes Relative: 8 %
Lymphs Abs: 1.1 10*3/uL (ref 0.7–4.0)
MCH: 25.2 pg — ABNORMAL LOW (ref 26.0–34.0)
MCHC: 29.8 g/dL — ABNORMAL LOW (ref 30.0–36.0)
MCV: 84.7 fL (ref 80.0–100.0)
Monocytes Absolute: 0.1 10*3/uL (ref 0.1–1.0)
Monocytes Relative: 1 %
Neutro Abs: 12.7 10*3/uL — ABNORMAL HIGH (ref 1.7–7.7)
Neutrophils Relative %: 91 %
Platelets: 224 10*3/uL (ref 150–400)
RBC: 4.04 MIL/uL (ref 3.87–5.11)
RDW: 16.6 % — ABNORMAL HIGH (ref 11.5–15.5)
WBC: 13.9 10*3/uL — ABNORMAL HIGH (ref 4.0–10.5)
nRBC: 0 % (ref 0.0–0.2)

## 2023-01-28 LAB — COMPREHENSIVE METABOLIC PANEL
ALT: 17 U/L (ref 0–44)
AST: 20 U/L (ref 15–41)
Albumin: 2.6 g/dL — ABNORMAL LOW (ref 3.5–5.0)
Alkaline Phosphatase: 58 U/L (ref 38–126)
Anion gap: 11 (ref 5–15)
BUN: 12 mg/dL (ref 8–23)
CO2: 24 mmol/L (ref 22–32)
Calcium: 8.9 mg/dL (ref 8.9–10.3)
Chloride: 106 mmol/L (ref 98–111)
Creatinine, Ser: 0.77 mg/dL (ref 0.44–1.00)
GFR, Estimated: >60 mL/min (ref >60)
Glucose, Bld: 302 mg/dL — ABNORMAL HIGH (ref 70–99)
Potassium: 4.1 mmol/L (ref 3.5–5.1)
Sodium: 141 mmol/L (ref 135–145)
Total Bilirubin: 0.8 mg/dL (ref 0.3–1.2)
Total Protein: 5.7 g/dL — ABNORMAL LOW (ref 6.5–8.1)

## 2023-01-28 LAB — BUN: BUN: 13 mg/dL (ref 8–23)

## 2023-01-28 LAB — CREATININE, SERUM
Creatinine, Ser: 0.52 mg/dL (ref 0.44–1.00)
GFR, Estimated: >60 mL/min (ref >60)

## 2023-01-28 LAB — HEMOGLOBIN AND HEMATOCRIT, BLOOD
HCT: 35.6 % — ABNORMAL LOW (ref 36.0–46.0)
Hemoglobin: 10.8 g/dL — ABNORMAL LOW (ref 12.0–15.0)

## 2023-01-28 SURGERY — LOWER EXTREMITY ANGIOGRAPHY
Anesthesia: Moderate Sedation | Site: Leg Lower | Laterality: Right

## 2023-01-28 MED ORDER — SODIUM CHLORIDE 0.9 % IV SOLN: 250.0000 mL | INTRAVENOUS | Status: DC | PRN

## 2023-01-28 MED ORDER — HYDROMORPHONE HCL 1 MG/ML IJ SOLN: 1.0000 mg | Freq: Once | INTRAMUSCULAR | Status: DC | PRN

## 2023-01-28 MED ORDER — MIDAZOLAM HCL 2 MG/2ML IJ SOLN
INTRAMUSCULAR | Status: AC
  Filled 2023-01-28: qty 2

## 2023-01-28 MED ORDER — METHYLPREDNISOLONE SODIUM SUCC 125 MG IJ SOLR
125.0000 mg | Freq: Once | INTRAMUSCULAR | Status: AC | PRN
  Administered 2023-01-28: 125 mg via INTRAVENOUS

## 2023-01-28 MED ORDER — VANCOMYCIN HCL IN DEXTROSE 1-5 GM/200ML-% IV SOLN
1000.0000 mg | INTRAVENOUS | Status: AC
  Administered 2023-01-28: 1000 mg via INTRAVENOUS

## 2023-01-28 MED ORDER — DIPHENHYDRAMINE HCL 50 MG/ML IJ SOLN
INTRAMUSCULAR | Status: AC
  Filled 2023-01-28: qty 1

## 2023-01-28 MED ORDER — HEPARIN SODIUM (PORCINE) 1000 UNIT/ML IJ SOLN
INTRAMUSCULAR | Status: AC
  Filled 2023-01-28: qty 10

## 2023-01-28 MED ORDER — NITROGLYCERIN 1 MG/10 ML FOR IR/CATH LAB
INTRA_ARTERIAL | Status: DC | PRN
  Administered 2023-01-28: 200 ug via INTRA_ARTERIAL
  Administered 2023-01-28: 300 ug via INTRA_ARTERIAL

## 2023-01-28 MED ORDER — MORPHINE SULFATE (PF) 4 MG/ML IV SOLN: 2.0000 mg | INTRAVENOUS | Status: DC | PRN

## 2023-01-28 MED ORDER — SODIUM CHLORIDE 0.9% FLUSH: 3.0000 mL | Freq: Two times a day (BID) | INTRAVENOUS | Status: DC

## 2023-01-28 MED ORDER — HYDRALAZINE HCL 20 MG/ML IJ SOLN: 5.0000 mg | INTRAMUSCULAR | Status: DC | PRN

## 2023-01-28 MED ORDER — MIDAZOLAM HCL 2 MG/ML PO SYRP: 8.0000 mg | ORAL_SOLUTION | Freq: Once | ORAL | Status: DC | PRN

## 2023-01-28 MED ORDER — FENTANYL CITRATE PF 50 MCG/ML IJ SOSY
PREFILLED_SYRINGE | INTRAMUSCULAR | Status: AC
  Filled 2023-01-28: qty 1

## 2023-01-28 MED ORDER — METHYLPREDNISOLONE SODIUM SUCC 125 MG IJ SOLR
INTRAMUSCULAR | Status: AC
  Filled 2023-01-28: qty 2

## 2023-01-28 MED ORDER — SODIUM CHLORIDE 0.9 % IV SOLN: INTRAVENOUS | Status: DC

## 2023-01-28 MED ORDER — ACETAMINOPHEN 325 MG PO TABS: 650.0000 mg | ORAL_TABLET | ORAL | Status: DC | PRN

## 2023-01-28 MED ORDER — IODIXANOL 320 MG/ML IV SOLN
INTRAVENOUS | Status: DC | PRN
  Administered 2023-01-28: 105 mL via INTRA_ARTERIAL

## 2023-01-28 MED ORDER — DIPHENHYDRAMINE HCL 50 MG/ML IJ SOLN
50.0000 mg | Freq: Once | INTRAMUSCULAR | Status: AC | PRN
  Administered 2023-01-28: 25 mg via INTRAVENOUS

## 2023-01-28 MED ORDER — ONDANSETRON HCL 4 MG/2ML IJ SOLN: 4.0000 mg | Freq: Four times a day (QID) | INTRAMUSCULAR | Status: DC | PRN

## 2023-01-28 MED ORDER — ONDANSETRON HCL 4 MG/2ML IJ SOLN
4.0000 mg | Freq: Four times a day (QID) | INTRAMUSCULAR | Status: DC | PRN
Start: 2023-01-28 — End: 2023-01-28

## 2023-01-28 MED ORDER — LIDOCAINE HCL (PF) 1 % IJ SOLN
INTRAMUSCULAR | Status: DC | PRN
  Administered 2023-01-28: 10 mL

## 2023-01-28 MED ORDER — FAMOTIDINE 20 MG PO TABS
40.0000 mg | ORAL_TABLET | Freq: Once | ORAL | Status: AC | PRN
  Administered 2023-01-28: 40 mg via ORAL

## 2023-01-28 MED ORDER — LABETALOL HCL 5 MG/ML IV SOLN: 10.0000 mg | INTRAVENOUS | Status: DC | PRN

## 2023-01-28 MED ORDER — NITROGLYCERIN 1 MG/10 ML FOR IR/CATH LAB
INTRA_ARTERIAL | Status: AC
  Filled 2023-01-28: qty 10

## 2023-01-28 MED ORDER — FENTANYL CITRATE (PF) 100 MCG/2ML IJ SOLN
INTRAMUSCULAR | Status: DC | PRN
  Administered 2023-01-28 (×2): 25 ug via INTRAVENOUS

## 2023-01-28 MED ORDER — VANCOMYCIN HCL IN DEXTROSE 1-5 GM/200ML-% IV SOLN
INTRAVENOUS | Status: AC
  Filled 2023-01-28: qty 200

## 2023-01-28 MED ORDER — OXYCODONE HCL 5 MG PO TABS: 5.0000 mg | ORAL_TABLET | ORAL | Status: DC | PRN

## 2023-01-28 MED ORDER — HEPARIN SODIUM (PORCINE) 1000 UNIT/ML IJ SOLN
INTRAMUSCULAR | Status: DC | PRN
  Administered 2023-01-28: 6000 [IU] via INTRAVENOUS
  Administered 2023-01-28: 2000 [IU] via INTRAVENOUS

## 2023-01-28 MED ORDER — HEPARIN (PORCINE) IN NACL 1000-0.9 UT/500ML-% IV SOLN
INTRAVENOUS | Status: DC | PRN
  Administered 2023-01-28: 1000 mL

## 2023-01-28 MED ORDER — FAMOTIDINE 20 MG PO TABS
ORAL_TABLET | ORAL | Status: AC
  Filled 2023-01-28: qty 2

## 2023-01-28 MED ORDER — SODIUM CHLORIDE 0.9% FLUSH: 3.0000 mL | INTRAVENOUS | Status: DC | PRN

## 2023-01-28 MED ORDER — APIXABAN 2.5 MG PO TABS: 2.5000 mg | ORAL_TABLET | Freq: Two times a day (BID) | ORAL | 4 refills | Status: DC

## 2023-01-28 MED ORDER — MIDAZOLAM HCL 2 MG/2ML IJ SOLN
INTRAMUSCULAR | Status: DC | PRN
  Administered 2023-01-28 (×3): .5 mg via INTRAVENOUS

## 2023-01-28 SURGICAL SUPPLY — 48 items
BALLN DORADO 4X100X135 (BALLOONS) ×1
BALLN DORADO 5X40X80 (BALLOONS) ×1
BALLN LUTONIX 018 4X220X130 (BALLOONS) ×1
BALLN ULTRSCOR 014 2.5X200X150 (BALLOONS) ×1
BALLN ULTRVRSE 018 5X40X150 (BALLOONS) ×1
BALLN ULTRVRSE 4X40X150 (BALLOONS) ×1
BALLN ULTRVRSE 4X40X150 OTW (BALLOONS) ×1
BALLN ULTRVRSE 6X40X130 (BALLOONS) ×1
BALLN ULTRVRSE 6X40X130 OTE (BALLOONS) ×1
BALLN ~~LOC~~ EUPHORA RX 4.5X20 (BALLOONS) ×1
BALLN ~~LOC~~ EUPHORA RX 5.0X12 (BALLOONS) ×1
BALLON DORADO 5X100X135 (BALLOONS) ×1
BALLOON DORADO 4X100X135 (BALLOONS) IMPLANT
BALLOON DORADO 5X100X135 (BALLOONS) IMPLANT
BALLOON DORADO 5X40X80 (BALLOONS) IMPLANT
BALLOON LUTONIX 018 4X220X130 (BALLOONS) IMPLANT
BALLOON ULTRSC 014 2.5X200X150 (BALLOONS) IMPLANT
BALLOON ULTRVRSE 018 5X40X150 (BALLOONS) IMPLANT
BALLOON ULTRVRSE 4X40X150 OTW (BALLOONS) IMPLANT
BALLOON ULTRVRSE 6X40X130 OTE (BALLOONS) IMPLANT
BALLOON ~~LOC~~ EUPHORA RX 4.5X20 (BALLOONS) IMPLANT
BALLOON ~~LOC~~ EUPHORA RX 5.0X12 (BALLOONS) IMPLANT
CANNULA 5F STIFF (CANNULA) IMPLANT
CATH ANGIO 5F PIGTAIL 65CM (CATHETERS) IMPLANT
CATH SEEKER .035X135CM (CATHETERS) IMPLANT
CATH VERT 5FR 125CM (CATHETERS) IMPLANT
DEVICE STARCLOSE SE CLOSURE (Vascular Products) IMPLANT
GLIDEWIRE ADV .035X260CM (WIRE) IMPLANT
GOWN SRG XL LVL 3 NONREINFORCE (GOWNS) IMPLANT
GOWN STRL NON-REIN TWL XL LVL3 (GOWNS) ×2
GOWN STRL REUS W/ TWL LRG LVL3 (GOWN DISPOSABLE) ×1 IMPLANT
GOWN STRL REUS W/TWL LRG LVL3 (GOWN DISPOSABLE) ×1
KIT ENCORE 26 ADVANTAGE (KITS) IMPLANT
LIFESTENT 6X80X130 (Permanent Stent) IMPLANT
NDL ENTRY 21GA 7CM ECHOTIP (NEEDLE) IMPLANT
NEEDLE ENTRY 21GA 7CM ECHOTIP (NEEDLE) ×1 IMPLANT
PACK ANGIOGRAPHY (CUSTOM PROCEDURE TRAY) ×1 IMPLANT
SET INTRO CAPELLA COAXIAL (SET/KITS/TRAYS/PACK) IMPLANT
SHEATH BRITE TIP 5FRX11 (SHEATH) IMPLANT
SHEATH RAABE 6FR (SHEATH) IMPLANT
STENT VIABAHN 6X7.5X120 (Permanent Stent) IMPLANT
SYR MEDRAD MARK 7 150ML (SYRINGE) IMPLANT
TUBING CONTRAST HIGH PRESS 72 (TUBING) IMPLANT
WIRE G V18X300CM (WIRE) IMPLANT
WIRE GUIDERIGHT .035X150 (WIRE) IMPLANT
WIRE RUNTHROUGH .014X180CM (WIRE) IMPLANT
WIRE RUNTHROUGH .014X300CM (WIRE) IMPLANT
WIRE SUPRACORE 300CM (WIRE) IMPLANT

## 2023-01-28 NOTE — ED Notes (Addendum)
Cleaned patient, ensured site wasn't bleeding. Fresh linen and brief applied. Provider at bedside to talk to family, plan updated.

## 2023-01-28 NOTE — Discharge Instructions (Signed)
Your hemoglobin is unchanged and has not dropped.  You should be safe to be discharged with the surgical glue over your wound.  Please decrease all activity for the next 24 hours and stay mostly in bed.  He may then resume regular activities.  Do not scratch, rub, or pick at the adhesive. Leave tissue adhesive in place. It will come off naturally after 7-10 days. Do not place tape over the adhesive. The adhesive could come off the wound when you pull the tape off. Protect the wound from further injury until it is healed. Check your wound area every day for signs of infection. Check for: More redness, swelling, or pain,Fluid or blood,Warmth, Pus or a bad smell. Do not take baths, swim, or use a hot tub until your health care provider approves. You may only be allowed to take sponge baths. Ask your health care provider if you may take showers.You can usually shower after the first 24 hours. Cover the dressing with a watertight covering when you take a shower. Do not soak the area where there is tissue adhesive. Do not use any soaps, petroleum jelly products, or ointments on the wound. Certain ointments can weaken the adhesive.

## 2023-01-28 NOTE — ED Provider Notes (Signed)
[place] EMERGENCY DEPARTMENT AT [facility] Provider Note   CSN: [id number] Arrival date & time: 01/28/23  1944     History {Add pertinent medical, surgical, social history, OB history to HPI:1} Chief Complaint  Patient presents with   Post-op Problem    [name] is a 82 y.o. female whoWas brought in for postop hemorrhage.  She has a past medical history of diabetes, hypertension, peripheral arterial disease, previous stroke, CHF, CVA.  History is gathered by review of EMR and EMS at bedside.  Patient underwent a angioplasty and stent placement in the left femoral artery today at [facility] by Dr. Marena Chancy.  Patient was being transported home when she had onset of bleeding.  Her son apparently grabbed a towel and applied direct pressure with all his might to her left femoral artery she is on Eliquis and aspirin.  EMS reports that prior to arrival they applied pressure however were unable to get solid blood pressure so diverted here for emergency services.  She has an IO in the left tibia.  HPI     Home Medications Prior to Admission medications   Medication Sig Start Date End Date Taking? Authorizing Provider  acetaminophen (TYLENOL) 500 MG tablet Take 1,000 mg by mouth every 6 (six) hours as needed for mild pain.    [provider]  amLODipine (NORVASC) 10 MG tablet Take 10 mg by mouth daily.    [provider]  apixaban (ELIQUIS) 2.5 MG TABS tablet Take 1 tablet (2.5 mg total) by mouth 2 (two) times daily. 01/28/23   Schnier, Latina Craver, MD  aspirin EC 81 MG EC tablet Take 1 tablet (81 mg total) by mouth daily. 04/29/19   Stegmayer, Cala Bradford A, PA-C  atorvastatin (LIPITOR) 20 MG tablet Take 1 tablet (20 mg total) by mouth daily. Patient taking differently: Take 20 mg by mouth at bedtime. 03/13/16   Maris Berger A, PA-C  budesonide (PULMICORT) 0.25 MG/2ML nebulizer solution Take 2 mLs (0.25 mg total) by nebulization 2 (two) times daily. 06/10/18   Auburn Bilberry, MD  carbamide peroxide (DEBROX) 6.5 % OTIC solution Place 5 drops into both ears 2 (two) times daily as needed (ear wax).    [provider]  Carboxymethylcellul-Glycerin (LUBRICATING EYE DROPS OP) Place 1 drop into both eyes daily as needed (dry eyes).    [provider]  carvedilol (COREG) 6.25 MG tablet Take 6.25 mg by mouth 2 (two) times daily with a meal.    [provider]  cefdinir (OMNICEF) 300 MG capsule Take 1 capsule (300 mg total) by mouth 2 (two) times daily. 11/04/21   Cathren Laine, MD  cetirizine (ZYRTEC) 10 MG tablet Take 10 mg by mouth daily.    [provider]  cholecalciferol (VITAMIN D3) 25 MCG (1000 UNIT) tablet Take 1,000 Units by mouth daily.    [provider]  clopidogrel (PLAVIX) 75 MG tablet Take 1 tablet (75 mg total) by mouth daily with breakfast. 03/04/17   Fransisco Hertz, MD  diclofenac Sodium (VOLTAREN) 1 % GEL Apply 1 application topically 4 (four) times daily as needed (pain).    [provider]  docusate sodium (COLACE) 100 MG capsule Take 100 mg by mouth daily.    [provider]  doxycycline (VIBRA-TABS) 100 MG tablet Take 100 mg by mouth 2 (two) times daily. 12/18/22   [provider]  fluticasone (FLONASE) 50 MCG/ACT nasal spray Place 1 spray into both nostrils daily as needed for allergies.  [provider]  gabapentin (NEURONTIN) 300 MG capsule Take 300 mg by mouth at bedtime.    [provider]  hydrALAZINE (APRESOLINE) 25 MG tablet Take 1 tablet (25 mg total) by mouth every 8 (eight) hours. 12/28/18   Enedina Finner, MD  insulin glargine (LANTUS) 100 UNIT/ML injection Inject 0.1 mLs (10 Units total) into the skin at bedtime. Patient taking differently: Inject 18 Units into the skin at bedtime. 03/10/21   Erick Blinks, MD  magnesium hydroxide (MILK OF MAGNESIA) 400 MG/5ML suspension Take 15 mLs by mouth daily as needed for mild constipation.    [provider]  magnesium oxide (MAG-OX) 400 MG tablet Take 400 mg by mouth 2 (two) times daily.     [provider]  modafinil (PROVIGIL) 200 MG tablet Take 200 mg by mouth daily.    [provider]  [facility] powder Apply 1 application topically daily. 03/05/21   [provider]  omeprazole (PRILOSEC) 20 MG capsule Take 1 capsule (20 mg total) by mouth daily. 04/17/18   Houston Siren, MD  polyethylene glycol (MIRALAX / GLYCOLAX) packet Take 17 g by mouth 2 (two) times daily. Patient taking differently: Take 17 g by mouth daily as needed for moderate constipation. 02/09/14   Leroy Sea, MD  potassium chloride SA (K-DUR,KLOR-CON) 20 MEQ tablet Take 20 mEq by mouth daily.    [provider]  torsemide (DEMADEX) 20 MG tablet Take 1 tablet (20 mg total) by mouth 2 (two) times daily. 12/28/18   Enedina Finner, MD  Vitamin E 180 MG CAPS Take 180 mg by mouth daily.    [provider]      Allergies    Egg-derived products, Iodine, Penicillins, Shellfish allergy, Sulfa antibiotics, Sulfacetamide sodium, Sulfasalazine, and Morphine and codeine    Review of Systems   Review of Systems  Physical Exam Updated Vital Signs BP (!) 144/60   Pulse 83   Temp 98.4 F (36.9 C)   Resp 19   SpO2 93%  Physical Exam Vitals and nursing note reviewed.  Constitutional:      General: She is not in acute distress.    Appearance: She is well-developed. She is obese. She is not diaphoretic.  HENT:     Head: Normocephalic and atraumatic.     Right Ear: External ear normal.     Left Ear: External ear normal.     Nose: Nose normal.     Mouth/Throat:     Mouth: Mucous membranes are moist.  Eyes:     General: No scleral icterus.    Conjunctiva/sclera: Conjunctivae normal.  Cardiovascular:     Rate and Rhythm: Normal rate and regular rhythm.     Heart sounds: Normal heart sounds. No murmur heard.    No friction rub. No gallop.  Pulmonary:     Effort: Pulmonary  effort is normal. No respiratory distress.     Breath sounds: Normal breath sounds.  Abdominal:     General: Bowel sounds are normal. There is no distension.     Palpations: Abdomen is soft. There is no mass.     Tenderness: There is no abdominal tenderness. There is no guarding.  Musculoskeletal:     Cervical back: Normal range of motion.  Skin:    General: Skin is warm and dry.     Comments: There is a 1 cm incision site over the left femoral artery.  No active bleeding at this time.  She is wearing an adult  diaper that is soaked in blood.  No evidence of seroma or swelling noted at the site.  Dermabond applied  Neurological:     Mental Status: She is alert.  Psychiatric:        Behavior: Behavior normal.     ED Results / Procedures / Treatments   Labs (all labs ordered are listed, but only abnormal results are displayed) Labs Reviewed  CBC WITH DIFFERENTIAL/PLATELET - Abnormal; Notable for the following components:      Result Value   WBC 13.9 (*)    Hemoglobin 10.2 (*)    HCT 34.2 (*)    MCH 25.2 (*)    MCHC 29.8 (*)    RDW 16.6 (*)    Neutro Abs 12.7 (*)    All other components within normal limits  COMPREHENSIVE METABOLIC PANEL    EKG None  Radiology PERIPHERAL VASCULAR CATHETERIZATION  Result Date: 01/28/2023 See surgical note for result.   Procedures Procedures  {Document cardiac monitor, telemetry assessment procedure when appropriate:1}  Medications Ordered in ED Medications - No data to display  ED Course/ Medical Decision Making/ A&P Clinical Course as of 01/28/23 2049  Tue Jan 28, 2023  2046 Case Discussed with Dr. Wyn Quaker. He agrees with current plan to monitor, lipid.  He does not have any recommendations as far as imaging.  If she maintains stability she may be discharged with bed rest for the next 24 hours and resume normal activities after. [AH]    Clinical Course User Index [AH] Arthor Captain, PA-C   {   Click here for ABCD2, HEART and other  calculatorsREFRESH Note before signing :1}                          Medical Decision Making Amount and/or Complexity of Data Reviewed Labs: ordered.   ***  {Document critical care time when appropriate:1} {Document review of labs and clinical decision tools ie heart score, Chads2Vasc2 etc:1}  {Document your independent review of radiology images, and any outside records:1} {Document your discussion with family members, caretakers, and with consultants:1} {Document social determinants of health affecting pt's care:1} {Document your decision making why or why not admission, treatments were needed:1} Final Clinical Impression(s) / ED Diagnoses Final diagnoses:  None    Rx / DC Orders ED Discharge Orders     None

## 2023-01-28 NOTE — Op Note (Signed)
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  Post-operative diagnosis:  Same  Procedure(s) Performed:             1.  Introduction catheter into right lower extremity 3rd order catheter placement               2.    Contrast injection right lower extremity for distal runoff             3.  Percutaneous transluminal angioplasty and stent placement right superficial femoral and popliteal arteries to 6 mm             4.  Percutaneous transluminal angioplasty and stent placement right anterior tibial             5.  Star close closure left common femoral arteriotomy  Anesthesia: Conscious sedation was administered under my direct supervision by the interventional radiology RN. IV Versed plus fentanyl were utilized. Continuous ECG, pulse oximetry and blood pressure was monitored throughout the entire procedure.  Conscious sedation was for a total of 116 minutes.  Sheath: 6 [place] Rabie left common femoral retrograde  Contrast: 105 cc  Fluoroscopy Time: 20 minutes  Indications:  Ilda Mori presents with increasing pain of the right lower extremity.  She has nonhealing wounds that have been cared for at the wound center and have actually have progressed.  Noninvasive studies as well as physical examination demonstrate severe atherosclerotic occlusive disease.  This suggests the patient is having limb threatening ischemia. The risks and benefits for angiography with intervention are explained to the patient and her husband all questions answered the husband and the patient agree to proceed.  Procedure:   LAKELY ELMENDORF is a 82 y.o. y.o. female who was identified and appropriate procedural time out was performed.  The patient was then placed supine on the  table and prepped and draped in the usual sterile fashion.    Ultrasound was placed in the sterile sleeve and the left groin was evaluated the left common femoral artery was echolucent and pulsatile indicating patency.  Image was recorded for the permanent record and under real-time visualization a microneedle was inserted into the common femoral artery followed by the microwire and then the micro-sheath.  A J-wire was then advanced through the micro-sheath and a  5 [place] sheath was then inserted over a J-wire. J-wire was then advanced and a 5 French pigtail catheter was positioned at the level of T12.  AP projection of the aorta was then obtained. Pigtail catheter was repositioned to above the bifurcation and a LAO view of the pelvis was obtained.  Subsequently a pigtail catheter with an Advantage wire was used to cross the aortic bifurcation.  The catheter and wire were advanced down into the right distal external iliac artery. Oblique view of the femoral bifurcation was then obtained and subsequently the wire was reintroduced and the pigtail catheter negotiated into the SFA representing third order catheter placement. Distal runoff was then performed.  This demonstrated occlusion of the previous SFA stent extending through the popliteal and into the proximal anterior tibial  6000 units of heparin was then given and allowed to circulate for several minutes.  A 6 French Ansell sheath was advanced up and over the bifurcation and positioned in the femoral artery  KMP catheter and  advantage Glidewire were then negotiated down into the distal popliteal and then the anterior tibial across the entire length of the occlusion.  Hand-injection contrast demonstrated patency of the anterior tibial and its mid and distal one third with extensive collaterals to the foot.  A 0.014 wire was then introduced.  A 2.5 mm x 200 mm ultra score balloon was then advanced across the lesion angioplasty was to 10 atm for  approximately 1 minute.  Follow-up imaging now demonstrated recanalization of flow but there is diffuse greater than 80% stenosis throughout the previous stent as well as into the anterior tibial proximally and the superficial femoral artery just proximal to the stents.  A 4 mm balloon was then used to treat the length of the stent inflation was to approximately 10 atm for approximately 1 minute.  Follow-up imaging demonstrated in the proximal stent in the SFA/popliteal portion the stenosis just above the stent was still present at greater than 50% and there was greater than 50% stenosis within the first several centimeters of the old stent.  For this reason I elected to place a 6 mm x 60 life stent and posted to 5 mm with a Lutonix drug-eluting balloon.  Follow-up imaging now demonstrated the proximal portion and the above-knee portion of the reconstruction is widely patent with less than 10% residual stenosis.  However in the distal popliteal stenosis still remained and in the origin of the anterior tibial there was still a greater than 70 to 80% stenosis.  Distal to the stent the anterior tibial now.  Well treated having mitten angioplasty with the ultra score balloon.  I then advanced a 4 mm balloon and subsequently a 5 mm balloon across this lesion within the stented segment of the anterior tibial.  Neither resulted in an adequate result with greater than 50% residual stenosis.  At this point I felt that reinforcing this area with an additional stent and covering the lesion in the distal popliteal would be the way to expand this area.  A 6 mm x 75 mm Viabahn stent was then advanced over the wire however it would not cross the stenosis in the proximal AT.  I therefore treated this area with a 6 mm balloon and a 7 mm balloon inflations were to 16 to 36 atm for 30 seconds to a minute.  Ultimately I was able to get the Viabahn stent across and deployed it.  Unfortunately again fully expanding the stent proved to  be quite difficult.  I treated it with a 5 mm balloon and then a 6 mm balloon and eventually a 7 mm balloon again inflations ranged from 16 atm to 236 atm.  The stent still seemed very undersized with 50% narrowing therefore we used a coronary balloon the [place] euphoria beginning with a 4.5 mm x 20 mm inflation to 40 atm and next a 5 mm x 12 mm Euphora balloon was inflated to 40 atm.  This did yield substantially however there is still 30% residual stenosis.  Distal runoff is preserved and at this point I felt I had done as much as I could and elected to terminate the case.  It should also be noted that it several times during the case as documented in the nursing notes intra-arterial nitroglycerin was given.   After review of these images the sheath is pulled into the left external iliac oblique of the common femoral is obtained and a Star close device deployed. There no immediate Complications.  Findings:  The  abdominal aorta is opacified with a bolus injection contrast. Renal arteries are single and widely patent without evidence of hemodynamically significant stenosis.  The aorta itself has diffuse disease but no hemodynamically significant lesions. The common and external iliac arteries are widely patent bilaterally.  The right common femoral is widely patent as is the profunda femoris.  The SFA does indeed have a significant stenosis which becomes an occlusion at Hunter's canal the entire length of the popliteal is occluded the trifurcation is occluded there is complete nonvisualization of the anterior tibial at its origin and the previously treated area below this it does reconstitute.  It is patent down to the foot with extensive collaterals to the foot but there is no true dorsalis pedis.  The tibioperoneal trunk posterior tibial and peroneal are occluded throughout their entirety and nonvisualized at the level of the ankle.    Following angioplasty and stent placement into the anterior tibial it is  now patent although there is 30% residual stenosis at the ostia of the anterior tibial.  Distal to this it is patent and remains patent the entire way down to the level of the ankle. Angioplasty stent placement of the SFA at Hunter's canal yields an excellent result with less than 10% residual stenosis.  Summary: Recanalization right lower extremity for limb salvage although this is an extremely disadvantage situation.  I am hopeful that this will allow for wound healing and limb salvage.                           Disposition: Patient was taken to the recovery room in stable condition having tolerated the procedure well.  Levora Dredge 01/28/2023,3:02 PM

## 2023-01-28 NOTE — Interval H&P Note (Signed)
History and Physical Interval Note:  01/28/2023 12:40 PM  [name]  has presented today for surgery, with the diagnosis of RLE Angio   ASO w ulceration.  The various methods of treatment have been discussed with the patient and family. After consideration of risks, benefits and other options for treatment, the patient has consented to  Procedure(s): Lower Extremity Angiography (Right) as a surgical intervention.  The patient's history has been reviewed, patient examined, no change in status, stable for surgery.  I have reviewed the patient's chart and labs.  Questions were answered to the patient's satisfaction.     Levora Dredge

## 2023-01-28 NOTE — ED Triage Notes (Signed)
Pt comes in from home after a vascular surgery at [place]. She started bleeding from the left femoral site. This prompted EMS to be called and bring here as we are closest facility. Pts pressures were 100/50 when they were able to get one. They started a left tibial IO and some fluids. Bleeding is now controlled upon arrival. She is now 144/60 upon arrival.

## 2023-01-29 LAB — GLUCOSE, CAPILLARY: Glucose-Capillary: 196 mg/dL — ABNORMAL HIGH (ref 70–99)

## 2023-01-29 NOTE — Progress Notes (Signed)
[name], [name] ([id number]) [id number] Page 1 of 9 Visit Report for 01/08/2023 Arrival Information Details Patient Name: Date of Service: [name], [name] 01/08/2023 3:00 PM Medical Record Number: [id number] Patient Account Number: [id number] Date of Birth/Sex: Treating RN: 1941/02/22 (82 y.o. Freddy Finner Primary Care Gelena Klosinski: Beverely Low Other Clinician: Referring Dimitria Ketchum: Treating Jonita Hirota/Extender: RO BSO N, MICHA EL Lorrin Goodell Weeks in Treatment: 5 Visit Information History Since Last Visit Added or deleted any medications: No Patient Arrived: Wheel Chair Any new allergies or adverse reactions: No Arrival Time: 15:07 Had a fall or experienced change in No Accompanied By: husband activities of daily living that may affect Transfer Assistance: None risk of falls: Patient Identification Verified: Yes Signs or symptoms of abuse/neglect since last visito No Secondary Verification Process Completed: Yes Hospitalized since last visit: No Patient Requires Transmission-Based No Implantable device outside of the clinic excluding No Precautions: cellular tissue based products placed in the center Patient Has Alerts: Yes since last visit: Patient Alerts: Patient on Blood Thinner Has Dressing in Place as Prescribed: Yes R ABI 1.32 TBI .85 2/28/2 Pain Present Now: No L ABI 1.09 TBI amp 2/28/2 Electronic Signature(s) Signed: 01/29/2023 11:52:51 AM By: Yevonne Pax RN Entered By: Yevonne Pax on 01/08/2023 15:10:50 -------------------------------------------------------------------------------- Clinic Level of Care Assessment Details Patient Name: Date of Service: [name], [name] 01/08/2023 3:00 PM Medical Record Number: [id number] Patient Account Number: [id number] Date of Birth/Sex: Treating RN: 04/28/1941 (82 y.o. Freddy Finner Primary Care Abhi Moccia: Beverely Low Other Clinician: Referring Romey Mathieson: Treating Jefry Lesinski/Extender: RO BSO N, MICHA EL  Lorrin Goodell Weeks in Treatment: 5 Clinic Level of Care Assessment Items TOOL 4 Quantity Score []  - 0 Use when only an EandM is performed on FOLLOW-UP visit ASSESSMENTS - Nursing Assessment / Reassessment X- 1 10 Reassessment of Co-morbidities (includes updates in patient status) X- 1 5 Reassessment of Adherence to Treatment Plan [name], [name] ([id number]) [id number] Page 2 of 9 ASSESSMENTS - Wound and Skin A ssessment / Reassessment X - Simple Wound Assessment / Reassessment - one wound 1 5 []  - 0 Complex Wound Assessment / Reassessment - multiple wounds []  - 0 Dermatologic / Skin Assessment (not related to wound area) ASSESSMENTS - Focused Assessment []  - 0 Circumferential Edema Measurements - multi extremities []  - 0 Nutritional Assessment / Counseling / Intervention []  - 0 Lower Extremity Assessment (monofilament, tuning fork, pulses) []  - 0 Peripheral Arterial Disease Assessment (using hand held doppler) ASSESSMENTS - Ostomy and/or Continence Assessment and Care []  - 0 Incontinence Assessment and Management []  - 0 Ostomy Care Assessment and Management (repouching, etc.) PROCESS - Coordination of Care X - Simple Patient / Family Education for ongoing care 1 15 []  - 0 Complex (extensive) Patient / Family Education for ongoing care []  - 0 Staff obtains Chiropractor, Records, T Results / Process Orders est []  - 0 Staff telephones HHA, Nursing Homes / Clarify orders / etc []  - 0 Routine Transfer to another Facility (non-emergent condition) []  - 0 Routine Hospital Admission (non-emergent condition) []  - 0 New Admissions / Manufacturing engineer / Ordering NPWT Apligraf, etc. , []  - 0 Emergency Hospital Admission (emergent condition) X- 1 10 Simple Discharge Coordination []  - 0 Complex (extensive) Discharge Coordination PROCESS - Special Needs []  - 0 Pediatric / Minor Patient Management []  - 0 Isolation Patient Management []  -  0 Hearing / Language / Visual special needs []  - 0 Assessment of Community assistance (transportation, D/C planning, etc.) []  - 0  Additional assistance / Altered mentation []  - 0 Support Surface(s) Assessment (bed, cushion, seat, etc.) INTERVENTIONS - Wound Cleansing / Measurement X - Simple Wound Cleansing - one wound 1 5 []  - 0 Complex Wound Cleansing - multiple wounds X- 1 5 Wound Imaging (photographs - any number of wounds) []  - 0 Wound Tracing (instead of photographs) X- 1 5 Simple Wound Measurement - one wound []  - 0 Complex Wound Measurement - multiple wounds INTERVENTIONS - Wound Dressings X - Small Wound Dressing one or multiple wounds 1 10 []  - 0 Medium Wound Dressing one or multiple wounds []  - 0 Large Wound Dressing one or multiple wounds []  - 0 Application of Medications - topical []  - 0 Application of Medications - injection INTERVENTIONS - Miscellaneous []  - 0 External ear exam Fleck, Wilmoth White ([id number]) [id number] Page 3 of 9 []  - 0 Specimen Collection (cultures, biopsies, blood, body fluids, etc.) []  - 0 Specimen(s) / Culture(s) sent or taken to Lab for analysis []  - 0 Patient Transfer (multiple staff / Michiel Sites Lift / Similar devices) []  - 0 Simple Staple / Suture removal (25 or less) []  - 0 Complex Staple / Suture removal (26 or more) []  - 0 Hypo / Hyperglycemic Management (close monitor of Blood Glucose) []  - 0 Ankle / Brachial Index (ABI) - do not check if billed separately X- 1 5 Vital Signs Has the patient been seen at the hospital within the last three years: Yes Total Score: 75 Level Of Care: New/Established - Level 2 Electronic Signature(s) Signed: 01/29/2023 11:52:51 AM By: Yevonne Pax RN Entered By: Yevonne Pax on 01/08/2023 16:22:03 -------------------------------------------------------------------------------- Encounter Discharge Information Details Patient Name: Date of Service: [name] Guard.  01/08/2023 3:00 PM Medical Record Number: [id number] Patient Account Number: [id number] Date of Birth/Sex: Treating RN: 02-22-1941 (82 y.o. Freddy Finner Primary Care Mike Hamre: Beverely Low Other Clinician: Referring Kaylynne Andres: Treating Bernarda Erck/Extender: RO BSO N, MICHA EL Vena [place] in Treatment: 5 Encounter Discharge Information Items Discharge Condition: Stable Ambulatory Status: Wheelchair Discharge Destination: Home Transportation: Private Auto Accompanied By: husband Schedule Follow-up Appointment: Yes Clinical Summary of Care: Electronic Signature(s) Signed: 01/08/2023 4:22:56 PM By: Yevonne Pax RN Entered By: Yevonne Pax on 01/08/2023 16:22:56 -------------------------------------------------------------------------------- Lower Extremity Assessment Details Patient Name: Date of Service: [name], [name] 01/08/2023 3:00 PM Schlink, Felisa Bonier ([id number]) [id number] Page 4 of 9 Medical Record Number: [id number] Patient Account Number: [id number] Date of Birth/Sex: Treating RN: [birth year] (82 y.o. Freddy Finner Primary Care Alania Overholt: Beverely Low Other Clinician: Referring Justinian Miano: Treating Aliviya Schoeller/Extender: RO BSO N, MICHA EL Lorrin Goodell Weeks in Treatment: 5 Edema Assessment Assessed: [Left: No] [Right: No] [Left: Edema] [Right: :] Calf Left: Right: Point of Measurement: 30 cm From Medial Instep 25 cm Ankle Left: Right: Point of Measurement: 10 cm From Medial Instep 19 cm Electronic Signature(s) Signed: 01/29/2023 11:52:51 AM By: Yevonne Pax RN Entered By: Yevonne Pax on 01/08/2023 15:14:29 -------------------------------------------------------------------------------- Multi Wound Chart Details Patient Name: Date of Service: [name] Guard. 01/08/2023 3:00 PM Medical Record Number: [id number] Patient Account Number: [id number] Date of Birth/Sex: Treating RN: 08-22-1940 (82 y.o. 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: Extremity Cataracts, Chronic sinus N/A N/A Comorbid History: problems/congestion, Congestive Heart Failure, Hypertension, Type II Diabetes 11/04/2022 N/A N/A Date Acquired: 5 N/A N/A Weeks of Treatment: [name], [name] ([id number]) [id number] Page 5 of 9 Open N/A N/A Wound Status: No N/A N/A Wound Recurrence: Yes N/A N/A Pending A mputation on Presentation: 2x1x0.6 N/A N/A Measurements L x W x D (cm) 1.571 N/A N/A A (cm) : rea 0.942 N/A N/A Volume (cm) : -735.60% N/A N/A % Reduction in A rea: -902.10% N/A N/A % Reduction in Volume: Grade 2 N/A N/A Classification: Medium N/A N/A Exudate A mount: Serosanguineous N/A N/A Exudate Type: red, brown N/A N/A Exudate Color: Small (1-33%) N/A N/A Granulation A mount: Pink N/A N/A Granulation Quality: Large (67-100%) N/A N/A Necrotic A mount: Fat Layer (Subcutaneous Tissue): Yes N/A N/A Exposed Structures: Bone: Yes Fascia: No Tendon: No Muscle: No Joint: No None N/A N/A Epithelialization: Treatment Notes Electronic Signature(s) Signed: 01/29/2023 11:52:51 AM By: Yevonne Pax RN Entered By: Yevonne Pax on 01/08/2023 15:14:33 -------------------------------------------------------------------------------- Multi-Disciplinary Care Plan Details Patient Name: Date of Service: [name] Guard. 01/08/2023 3:00 PM Medical Record Number: [id number] Patient Account Number: [id number] Date of Birth/Sex: Treating RN: [birth year] (82  y.o. Freddy Finner Primary Care Reana Chacko: Beverely Low Other Clinician: Referring Shonte Beutler: Treating Gennesis Hogland/Extender: RO BSO N, MICHA EL Lorrin Goodell Weeks in Treatment: 5 Active Inactive Wound/Skin Impairment Nursing Diagnoses: Knowledge deficit related to ulceration/compromised skin integrity Goals: Patient/caregiver will verbalize understanding of skin care regimen Date Initiated: 12/04/2022 Target Resolution Date: 02/03/2023 Goal Status: Active Ulcer/skin breakdown will have a volume reduction of 30% by week 4 Date Initiated: 12/04/2022 Date Inactivated: 01/08/2023 Target Resolution Date: 01/04/2023 Goal Status: Unmet Unmet Reason: comorbidities Ulcer/skin breakdown will have a volume reduction of 50% by week 8 Date Initiated: 12/04/2022 Target Resolution Date: 02/03/2023 Goal Status: Active Ulcer/skin breakdown will have a volume reduction of 80% by week 12 Date Initiated: 12/04/2022 Target Resolution Date: 03/06/2023 Goal Status: Active Ulcer/skin breakdown will heal within 14 weeks Date Initiated: 12/04/2022 Target Resolution Date: 04/06/2023 [name], [name] ([id number]) [id number] Page 6 of 9 Goal Status: Active Interventions: Assess patient/caregiver ability to obtain necessary supplies Assess patient/caregiver ability to perform ulcer/skin care regimen upon admission and as needed Assess ulceration(s) every visit Notes: Electronic Signature(s) Signed: 01/29/2023 11:52:51 AM By: Yevonne Pax RN Entered By: Yevonne Pax on 01/08/2023 15:15:06 -------------------------------------------------------------------------------- Pain Assessment Details Patient Name: Date of Service: [name], [name] 01/08/2023 3:00 PM Medical Record Number: [id number] Patient Account Number: [id number] Date of Birth/Sex: Treating RN: 1941/05/13 (82 y.o. Freddy Finner Primary Care [place]: Beverely Low Other Clinician: Referring Kassie Keng: Treating Alexxa Sabet/Extender: RO BSO N,  MICHA EL Lorrin Goodell Weeks in Treatment: 5 Active Problems Location of Pain Severity and Description of Pain Patient Has Paino No Site Locations Pain Management and Medication Current Pain Management: Electronic Signature(s) Signed: 01/29/2023 11:52:51 AM By: Yevonne Pax RN Entered By: Yevonne Pax on 01/08/2023 15:11:19 Lewing, Ashlynd White ([id number]) [id number] Page 7 of 9 -------------------------------------------------------------------------------- Patient/Caregiver Education Details Patient Name: Date of Service: [name], [name] 6/5/2024andnbsp3:00 PM Medical Record Number: [id number] Patient Account Number: [id number] Date of Birth/Gender: Treating RN: 05-10-1941 (81 y.o. Freddy Finner Primary Care Physician: Beverely Low Other Clinician: Referring Physician: Treating Physician/Extender: RO BSO Dorris Carnes, MICHA EL Vena [place] in Treatment: 5 Education Assessment Education Provided To: Patient Education Topics Provided Wound/Skin Impairment: Handouts: Caring for Your Ulcer Methods: Explain/Verbal  Responses: State content correctly Electronic Signature(s) Signed: 01/29/2023 11:52:51 AM By: Yevonne Pax RN Entered By: Yevonne Pax on 01/08/2023 15:15:21 -------------------------------------------------------------------------------- Wound Assessment Details Patient Name: Date of Service: [name], [name] 01/08/2023 3:00 PM Medical Record Number: [id number] Patient Account Number: [id number] Date of Birth/Sex: Treating RN: Oct 09, 1940 (82 y.o. Freddy Finner Primary Care Veanna Dower: Beverely Low Other Clinician: Referring Ellamae Lybeck: Treating Ilaria Much/Extender: RO BSO N, MICHA EL Lorrin Goodell Weeks in Treatment: 5 Wound Status Wound Number: 12 Primary Diabetic Wound/Ulcer of the Lower Extremity Etiology: Wound Location: Right, Distal, Dorsal Foot Wound Open Wounding Event: Gradually Appeared Status: Date Acquired: 11/04/2022 Comorbid  Cataracts, Chronic sinus problems/congestion, Congestive Heart Weeks Of Treatment: 5 History: Failure, Hypertension, Type II Diabetes Clustered Wound: No Pending Amputation On Presentation Photos [name], [name] ([id number]) [id number] Page 8 of 9 Wound Measurements Length: (cm) 2 Width: (cm) 1 Depth: (cm) 0.6 Area: (cm) 1.571 Volume: (cm) 0.942 % Reduction in Area: -735.6% % Reduction in Volume: -902.1% Epithelialization: None Tunneling: No Undermining: No Wound Description Classification: Grade 2 Exudate Amount: Medium Exudate Type: Serosanguineous Exudate Color: red, brown Foul Odor After Cleansing: No Slough/Fibrino Yes Wound Bed Granulation Amount: Small (1-33%) Exposed Structure Granulation Quality: Pink Fascia Exposed: No Necrotic Amount: Large (67-100%) Fat Layer (Subcutaneous Tissue) Exposed: Yes Necrotic Quality: Adherent Slough Tendon Exposed: No Muscle Exposed: No Joint Exposed: No Bone Exposed: Yes Electronic Signature(s) Signed: 01/29/2023 11:52:51 AM By: Yevonne Pax RN Entered By: Yevonne Pax on 01/08/2023 15:14:03 -------------------------------------------------------------------------------- Vitals Details Patient Name: Date of Service: [name] Guard. 01/08/2023 3:00 PM Medical Record Number: [id number] Patient Account Number: [id number] Date of Birth/Sex: Treating RN: 09-22-1940 (82 y.o. Freddy Finner Primary Care Makaylen Thieme: Beverely Low Other Clinician: Referring Keilyn Haggard: Treating Jacqlyn Marolf/Extender: RO BSO N, MICHA EL Lorrin Goodell Weeks in Treatment: 5 Vital Signs Time Taken: 15:10 Temperature (F): 98.3 Height (in): 64 Pulse (bpm): 73 Weight (lbs): 145 Respiratory Rate (breaths/min): 16 Body Mass Index (BMI): 24.9 Blood Pressure (mmHg): 120/72 Reference Range: 80 - 120 mg / dl Electronic Signature(s) Signed: 01/29/2023 11:52:51 AM By: Yevonne Pax RN Entered By: Yevonne Pax on 01/08/2023 15:11:10 [name],  [name] White ([id number]) [id number] Page 9 of 9

## 2023-01-29 NOTE — ED Notes (Signed)
Ptar is here for transport 

## 2023-01-29 NOTE — ED Notes (Signed)
Called Husband and son, to advise patient is on the way home.

## 2023-02-03 ENCOUNTER — Emergency Department (HOSPITAL_COMMUNITY): Payer: Medicare HMO

## 2023-02-03 ENCOUNTER — Other Ambulatory Visit: Payer: Self-pay

## 2023-02-03 ENCOUNTER — Encounter (HOSPITAL_COMMUNITY): Payer: Self-pay | Admitting: Emergency Medicine

## 2023-02-03 ENCOUNTER — Inpatient Hospital Stay (HOSPITAL_COMMUNITY)
Admission: EM | Admit: 2023-02-03 | Discharge: 2023-02-07 | DRG: 689 | Disposition: A | Discharge status: Home Health | Payer: Medicare HMO | Attending: Family Medicine | Admitting: Family Medicine

## 2023-02-03 DIAGNOSIS — Z8249 Family history of ischemic heart disease and other diseases of the circulatory system: Secondary | ICD-10-CM | POA: Diagnosis not present

## 2023-02-03 DIAGNOSIS — D329 Benign neoplasm of meninges, unspecified: Secondary | ICD-10-CM | POA: Diagnosis present

## 2023-02-03 DIAGNOSIS — Z794 Long term (current) use of insulin: Secondary | ICD-10-CM

## 2023-02-03 DIAGNOSIS — J449 Chronic obstructive pulmonary disease, unspecified: Secondary | ICD-10-CM | POA: Diagnosis present

## 2023-02-03 DIAGNOSIS — G9340 Encephalopathy, unspecified: Secondary | ICD-10-CM

## 2023-02-03 DIAGNOSIS — G319 Degenerative disease of nervous system, unspecified: Secondary | ICD-10-CM | POA: Diagnosis present

## 2023-02-03 DIAGNOSIS — Z888 Allergy status to other drugs, medicaments and biological substances status: Secondary | ICD-10-CM

## 2023-02-03 DIAGNOSIS — Z9841 Cataract extraction status, right eye: Secondary | ICD-10-CM

## 2023-02-03 DIAGNOSIS — I350 Nonrheumatic aortic (valve) stenosis: Secondary | ICD-10-CM | POA: Diagnosis not present

## 2023-02-03 DIAGNOSIS — Z885 Allergy status to narcotic agent status: Secondary | ICD-10-CM

## 2023-02-03 DIAGNOSIS — D638 Anemia in other chronic diseases classified elsewhere: Secondary | ICD-10-CM | POA: Diagnosis present

## 2023-02-03 DIAGNOSIS — E1140 Type 2 diabetes mellitus with diabetic neuropathy, unspecified: Secondary | ICD-10-CM | POA: Diagnosis present

## 2023-02-03 DIAGNOSIS — E7800 Pure hypercholesterolemia, unspecified: Secondary | ICD-10-CM | POA: Diagnosis present

## 2023-02-03 DIAGNOSIS — Z6821 Body mass index (BMI) 21.0-21.9, adult: Secondary | ICD-10-CM

## 2023-02-03 DIAGNOSIS — Z880 Allergy status to penicillin: Secondary | ICD-10-CM

## 2023-02-03 DIAGNOSIS — E1151 Type 2 diabetes mellitus with diabetic peripheral angiopathy without gangrene: Secondary | ICD-10-CM | POA: Diagnosis present

## 2023-02-03 DIAGNOSIS — J189 Pneumonia, unspecified organism: Secondary | ICD-10-CM

## 2023-02-03 DIAGNOSIS — I3139 Other pericardial effusion (noninflammatory): Secondary | ICD-10-CM

## 2023-02-03 DIAGNOSIS — J9811 Atelectasis: Secondary | ICD-10-CM | POA: Diagnosis present

## 2023-02-03 DIAGNOSIS — L97519 Non-pressure chronic ulcer of other part of right foot with unspecified severity: Secondary | ICD-10-CM | POA: Diagnosis present

## 2023-02-03 DIAGNOSIS — Z79899 Other long term (current) drug therapy: Secondary | ICD-10-CM

## 2023-02-03 DIAGNOSIS — Z7951 Long term (current) use of inhaled steroids: Secondary | ICD-10-CM

## 2023-02-03 DIAGNOSIS — E669 Obesity, unspecified: Secondary | ICD-10-CM | POA: Diagnosis present

## 2023-02-03 DIAGNOSIS — Z7901 Long term (current) use of anticoagulants: Secondary | ICD-10-CM

## 2023-02-03 DIAGNOSIS — K219 Gastro-esophageal reflux disease without esophagitis: Secondary | ICD-10-CM | POA: Diagnosis present

## 2023-02-03 DIAGNOSIS — E782 Mixed hyperlipidemia: Secondary | ICD-10-CM | POA: Diagnosis present

## 2023-02-03 DIAGNOSIS — I739 Peripheral vascular disease, unspecified: Secondary | ICD-10-CM | POA: Diagnosis not present

## 2023-02-03 DIAGNOSIS — I70229 Atherosclerosis of native arteries of extremities with rest pain, unspecified extremity: Secondary | ICD-10-CM | POA: Diagnosis present

## 2023-02-03 DIAGNOSIS — I2489 Other forms of acute ischemic heart disease: Secondary | ICD-10-CM | POA: Diagnosis present

## 2023-02-03 DIAGNOSIS — N390 Urinary tract infection, site not specified: Secondary | ICD-10-CM | POA: Diagnosis present

## 2023-02-03 DIAGNOSIS — R319 Hematuria, unspecified: Secondary | ICD-10-CM | POA: Diagnosis present

## 2023-02-03 DIAGNOSIS — Z91012 Allergy to eggs: Secondary | ICD-10-CM

## 2023-02-03 DIAGNOSIS — F05 Delirium due to known physiological condition: Secondary | ICD-10-CM | POA: Diagnosis present

## 2023-02-03 DIAGNOSIS — I110 Hypertensive heart disease with heart failure: Secondary | ICD-10-CM | POA: Diagnosis present

## 2023-02-03 DIAGNOSIS — J440 Chronic obstructive pulmonary disease with acute lower respiratory infection: Secondary | ICD-10-CM | POA: Diagnosis present

## 2023-02-03 DIAGNOSIS — Z9842 Cataract extraction status, left eye: Secondary | ICD-10-CM

## 2023-02-03 DIAGNOSIS — E876 Hypokalemia: Secondary | ICD-10-CM | POA: Diagnosis present

## 2023-02-03 DIAGNOSIS — G9341 Metabolic encephalopathy: Secondary | ICD-10-CM | POA: Diagnosis present

## 2023-02-03 DIAGNOSIS — I7025 Atherosclerosis of native arteries of other extremities with ulceration: Secondary | ICD-10-CM | POA: Diagnosis present

## 2023-02-03 DIAGNOSIS — I5032 Chronic diastolic (congestive) heart failure: Secondary | ICD-10-CM | POA: Diagnosis present

## 2023-02-03 DIAGNOSIS — I10 Essential (primary) hypertension: Secondary | ICD-10-CM | POA: Diagnosis present

## 2023-02-03 DIAGNOSIS — Z882 Allergy status to sulfonamides status: Secondary | ICD-10-CM

## 2023-02-03 DIAGNOSIS — Z7902 Long term (current) use of antithrombotics/antiplatelets: Secondary | ICD-10-CM

## 2023-02-03 DIAGNOSIS — E785 Hyperlipidemia, unspecified: Secondary | ICD-10-CM | POA: Diagnosis present

## 2023-02-03 DIAGNOSIS — Z89422 Acquired absence of other left toe(s): Secondary | ICD-10-CM

## 2023-02-03 DIAGNOSIS — R7989 Other specified abnormal findings of blood chemistry: Secondary | ICD-10-CM

## 2023-02-03 DIAGNOSIS — Z833 Family history of diabetes mellitus: Secondary | ICD-10-CM

## 2023-02-03 DIAGNOSIS — Z91041 Radiographic dye allergy status: Secondary | ICD-10-CM

## 2023-02-03 DIAGNOSIS — R9431 Abnormal electrocardiogram [ECG] [EKG]: Secondary | ICD-10-CM | POA: Diagnosis not present

## 2023-02-03 DIAGNOSIS — B9620 Unspecified Escherichia coli [E. coli] as the cause of diseases classified elsewhere: Secondary | ICD-10-CM | POA: Diagnosis present

## 2023-02-03 DIAGNOSIS — Z8673 Personal history of transient ischemic attack (TIA), and cerebral infarction without residual deficits: Secondary | ICD-10-CM

## 2023-02-03 DIAGNOSIS — Z91013 Allergy to seafood: Secondary | ICD-10-CM

## 2023-02-03 DIAGNOSIS — Z7982 Long term (current) use of aspirin: Secondary | ICD-10-CM

## 2023-02-03 DIAGNOSIS — Z823 Family history of stroke: Secondary | ICD-10-CM

## 2023-02-03 LAB — HEMOGLOBIN A1C
Hgb A1c MFr Bld: 7.3 % — ABNORMAL HIGH (ref 4.8–5.6)
Mean Plasma Glucose: 162.81 mg/dL

## 2023-02-03 LAB — CBC WITH DIFFERENTIAL/PLATELET
Abs Immature Granulocytes: 0.04 10*3/uL (ref 0.00–0.07)
Basophils Absolute: 0.1 10*3/uL (ref 0.0–0.1)
Basophils Relative: 1 %
Eosinophils Absolute: 0.9 10*3/uL — ABNORMAL HIGH (ref 0.0–0.5)
Eosinophils Relative: 7 %
HCT: 30.2 % — ABNORMAL LOW (ref 36.0–46.0)
Hemoglobin: 8.8 g/dL — ABNORMAL LOW (ref 12.0–15.0)
Immature Granulocytes: 0 %
Lymphocytes Relative: 10 %
Lymphs Abs: 1.2 10*3/uL (ref 0.7–4.0)
MCH: 25.1 pg — ABNORMAL LOW (ref 26.0–34.0)
MCHC: 29.1 g/dL — ABNORMAL LOW (ref 30.0–36.0)
MCV: 86 fL (ref 80.0–100.0)
Monocytes Absolute: 0.5 10*3/uL (ref 0.1–1.0)
Monocytes Relative: 4 %
Neutro Abs: 9.2 10*3/uL — ABNORMAL HIGH (ref 1.7–7.7)
Neutrophils Relative %: 78 %
Platelets: 276 10*3/uL (ref 150–400)
RBC: 3.51 MIL/uL — ABNORMAL LOW (ref 3.87–5.11)
RDW: 17.7 % — ABNORMAL HIGH (ref 11.5–15.5)
WBC: 12 10*3/uL — ABNORMAL HIGH (ref 4.0–10.5)
nRBC: 0 % (ref 0.0–0.2)

## 2023-02-03 LAB — COMPREHENSIVE METABOLIC PANEL
ALT: 19 U/L (ref 0–44)
AST: 17 U/L (ref 15–41)
Albumin: 2.7 g/dL — ABNORMAL LOW (ref 3.5–5.0)
Alkaline Phosphatase: 58 U/L (ref 38–126)
Anion gap: 9 (ref 5–15)
BUN: 15 mg/dL (ref 8–23)
CO2: 25 mmol/L (ref 22–32)
Calcium: 9.1 mg/dL (ref 8.9–10.3)
Chloride: 107 mmol/L (ref 98–111)
Creatinine, Ser: 0.57 mg/dL (ref 0.44–1.00)
GFR, Estimated: >60 mL/min (ref >60)
Glucose, Bld: 200 mg/dL — ABNORMAL HIGH (ref 70–99)
Potassium: 4.1 mmol/L (ref 3.5–5.1)
Sodium: 141 mmol/L (ref 135–145)
Total Bilirubin: 1.1 mg/dL (ref 0.3–1.2)
Total Protein: 6.5 g/dL (ref 6.5–8.1)

## 2023-02-03 LAB — URINALYSIS, ROUTINE W REFLEX MICROSCOPIC
Bilirubin Urine: NEGATIVE
Glucose, UA: NEGATIVE mg/dL
Hgb urine dipstick: NEGATIVE
Ketones, ur: 20 mg/dL — AB
Nitrite: POSITIVE — AB
Protein, ur: 30 mg/dL — AB
Specific Gravity, Urine: 1.014 (ref 1.005–1.030)
WBC, UA: >50 WBC/hpf (ref 0–5)
pH: 7 (ref 5.0–8.0)

## 2023-02-03 LAB — BLOOD GAS, VENOUS
Acid-Base Excess: 3.3 mmol/L — ABNORMAL HIGH (ref 0.0–2.0)
Bicarbonate: 29 mmol/L — ABNORMAL HIGH (ref 20.0–28.0)
Drawn by: 66460
O2 Saturation: 48.3 %
Patient temperature: 36.6
pCO2, Ven: 48 mmHg (ref 44–60)
pH, Ven: 7.39 (ref 7.25–7.43)
pO2, Ven: <31 mmHg — CL (ref 32–45)

## 2023-02-03 LAB — TSH: TSH: 0.819 u[IU]/mL (ref 0.350–4.500)

## 2023-02-03 LAB — TROPONIN I (HIGH SENSITIVITY)
Troponin I (High Sensitivity): 163 ng/L (ref <18)
Troponin I (High Sensitivity): 181 ng/L (ref <18)

## 2023-02-03 LAB — AMMONIA: Ammonia: <10 umol/L (ref 9–35)

## 2023-02-03 LAB — BRAIN NATRIURETIC PEPTIDE: B Natriuretic Peptide: 699 pg/mL — ABNORMAL HIGH (ref 0.0–100.0)

## 2023-02-03 LAB — LIPASE, BLOOD: Lipase: 24 U/L (ref 11–51)

## 2023-02-03 LAB — GLUCOSE, CAPILLARY: Glucose-Capillary: 192 mg/dL — ABNORMAL HIGH (ref 70–99)

## 2023-02-03 LAB — CBG MONITORING, ED: Glucose-Capillary: 198 mg/dL — ABNORMAL HIGH (ref 70–99)

## 2023-02-03 LAB — MAGNESIUM: Magnesium: 2.5 mg/dL — ABNORMAL HIGH (ref 1.7–2.4)

## 2023-02-03 MED ORDER — ONDANSETRON HCL 4 MG/2ML IJ SOLN: 4.0000 mg | Freq: Four times a day (QID) | INTRAMUSCULAR | Status: DC | PRN

## 2023-02-03 MED ORDER — PANTOPRAZOLE SODIUM 40 MG PO TBEC
40.0000 mg | DELAYED_RELEASE_TABLET | Freq: Every day | ORAL | Status: DC
  Administered 2023-02-04: 40 mg via ORAL
  Filled 2023-02-03: qty 1

## 2023-02-03 MED ORDER — HYDRALAZINE HCL 25 MG PO TABS
25.0000 mg | ORAL_TABLET | Freq: Three times a day (TID) | ORAL | Status: DC
  Administered 2023-02-03 – 2023-02-06 (×5): 25 mg via ORAL
  Filled 2023-02-03 (×10): qty 1

## 2023-02-03 MED ORDER — SODIUM CHLORIDE 0.9 % IV SOLN
1.0000 g | Freq: Three times a day (TID) | INTRAVENOUS | Status: DC
  Administered 2023-02-04 – 2023-02-05 (×4): 1 g via INTRAVENOUS
  Filled 2023-02-03 (×4): qty 20

## 2023-02-03 MED ORDER — ATORVASTATIN CALCIUM 10 MG PO TABS
20.0000 mg | ORAL_TABLET | Freq: Every day | ORAL | Status: DC
  Administered 2023-02-03 – 2023-02-06 (×3): 20 mg via ORAL
  Filled 2023-02-03 (×4): qty 2

## 2023-02-03 MED ORDER — SODIUM CHLORIDE 0.9 % IV SOLN
500.0000 mg | INTRAVENOUS | Status: DC
  Administered 2023-02-03: 500 mg via INTRAVENOUS
  Filled 2023-02-03: qty 5

## 2023-02-03 MED ORDER — HYDRALAZINE HCL 20 MG/ML IJ SOLN: 10.0000 mg | INTRAMUSCULAR | Status: DC | PRN

## 2023-02-03 MED ORDER — ACETAMINOPHEN 325 MG PO TABS
650.0000 mg | ORAL_TABLET | Freq: Four times a day (QID) | ORAL | Status: DC | PRN
  Administered 2023-02-06: 650 mg via ORAL
  Filled 2023-02-03 (×2): qty 2

## 2023-02-03 MED ORDER — ACETAMINOPHEN 650 MG RE SUPP: 650.0000 mg | Freq: Four times a day (QID) | RECTAL | Status: DC | PRN

## 2023-02-03 MED ORDER — ASPIRIN 81 MG PO CHEW
324.0000 mg | CHEWABLE_TABLET | Freq: Once | ORAL | Status: AC
  Administered 2023-02-03: 324 mg via ORAL
  Filled 2023-02-03: qty 4

## 2023-02-03 MED ORDER — SODIUM CHLORIDE 0.9 % IV SOLN
1.0000 g | Freq: Once | INTRAVENOUS | Status: AC
  Administered 2023-02-03: 1 g via INTRAVENOUS
  Filled 2023-02-03: qty 20

## 2023-02-03 MED ORDER — BUDESONIDE 0.25 MG/2ML IN SUSP
0.2500 mg | Freq: Two times a day (BID) | RESPIRATORY_TRACT | Status: DC
  Administered 2023-02-04 – 2023-02-06 (×5): 0.25 mg via RESPIRATORY_TRACT
  Filled 2023-02-03 (×6): qty 2

## 2023-02-03 MED ORDER — MODAFINIL 200 MG PO TABS
200.0000 mg | ORAL_TABLET | Freq: Every day | ORAL | Status: DC
  Administered 2023-02-04 – 2023-02-06 (×2): 200 mg via ORAL
  Filled 2023-02-03 (×4): qty 1

## 2023-02-03 MED ORDER — SODIUM CHLORIDE 0.9% FLUSH
3.0000 mL | Freq: Two times a day (BID) | INTRAVENOUS | Status: DC
  Administered 2023-02-03 – 2023-02-06 (×6): 3 mL via INTRAVENOUS

## 2023-02-03 MED ORDER — SODIUM CHLORIDE 0.9 % IV BOLUS
1000.0000 mL | Freq: Once | INTRAVENOUS | Status: AC
  Administered 2023-02-03: 1000 mL via INTRAVENOUS

## 2023-02-03 MED ORDER — BUDESONIDE 0.25 MG/2ML IN SUSP
0.2500 mg | Freq: Two times a day (BID) | RESPIRATORY_TRACT | Status: DC
  Administered 2023-02-03: 0.25 mg via RESPIRATORY_TRACT
  Filled 2023-02-03: qty 2

## 2023-02-03 MED ORDER — CLOPIDOGREL BISULFATE 75 MG PO TABS
75.0000 mg | ORAL_TABLET | Freq: Every day | ORAL | Status: DC
  Administered 2023-02-04 – 2023-02-06 (×3): 75 mg via ORAL
  Filled 2023-02-03 (×3): qty 1

## 2023-02-03 MED ORDER — ENOXAPARIN SODIUM 80 MG/0.8ML IJ SOSY
1.0000 mg/kg | PREFILLED_SYRINGE | Freq: Two times a day (BID) | INTRAMUSCULAR | Status: DC
  Administered 2023-02-03 – 2023-02-04 (×2): 65 mg via SUBCUTANEOUS
  Filled 2023-02-03 (×2): qty 0.8

## 2023-02-03 MED ORDER — ONDANSETRON HCL 4 MG PO TABS: 4.0000 mg | ORAL_TABLET | Freq: Four times a day (QID) | ORAL | Status: DC | PRN

## 2023-02-03 MED ORDER — INSULIN ASPART 100 UNIT/ML IJ SOLN
0.0000 [IU] | INTRAMUSCULAR | Status: DC
  Administered 2023-02-03: 2 [IU] via SUBCUTANEOUS
  Administered 2023-02-04: 1 [IU] via SUBCUTANEOUS
  Administered 2023-02-04: 2 [IU] via SUBCUTANEOUS

## 2023-02-03 MED ORDER — DOCUSATE SODIUM 100 MG PO CAPS
100.0000 mg | ORAL_CAPSULE | Freq: Every day | ORAL | Status: DC
  Administered 2023-02-04 – 2023-02-06 (×3): 100 mg via ORAL
  Filled 2023-02-03 (×3): qty 1

## 2023-02-03 MED ORDER — SODIUM CHLORIDE 0.9 % IV SOLN
1.0000 g | Freq: Once | INTRAVENOUS | Status: AC
  Administered 2023-02-03: 1 g via INTRAVENOUS
  Filled 2023-02-03: qty 10

## 2023-02-03 MED ORDER — AMLODIPINE BESYLATE 5 MG PO TABS
10.0000 mg | ORAL_TABLET | Freq: Every day | ORAL | Status: DC
  Administered 2023-02-04 – 2023-02-06 (×3): 10 mg via ORAL
  Filled 2023-02-03 (×3): qty 2

## 2023-02-03 MED ORDER — SODIUM CHLORIDE 0.9% FLUSH: 3.0000 mL | INTRAVENOUS | Status: DC | PRN

## 2023-02-03 MED ORDER — SODIUM CHLORIDE 0.9 % IV SOLN: 250.0000 mL | INTRAVENOUS | Status: DC | PRN

## 2023-02-03 MED ORDER — CARVEDILOL 3.125 MG PO TABS
6.2500 mg | ORAL_TABLET | Freq: Two times a day (BID) | ORAL | Status: DC
  Administered 2023-02-03 – 2023-02-06 (×7): 6.25 mg via ORAL
  Filled 2023-02-03 (×7): qty 2

## 2023-02-03 NOTE — H&P (Addendum)
History and Physical    [name]:[id number] DOB: May 19, 1941 DOA: 02/03/2023  PCP: Abram Sander, MD   Patient coming from: Home  Chief Complaint: Lethargy  HPI: [name] is a 82 y.o. female with medical history significant for hypertension, type 2 diabetes, chronic diastolic heart failure, dyslipidemia, COPD, and PAD with claudication with recent right lower extremity angioplasty on 6/25.  She presented to the ED with increasing weakness and refusing to eat over the last 2-3 days.  She has had similar symptoms previously related to urinary tract infection.  She typically wears a diaper and has not noticed any change to her bowel or bladder function from usual baseline.  No fevers or chills noted at home or nausea or vomiting.  She is also refusing her home medications.   ED Course: Vital signs stable and patient afebrile.  Leukocytosis of 12,000 noted and troponin 181 with BNP 699.  Glucose 200.  CT head with no acute findings and CT imaging of the abdomen pelvis with no acute findings.  There is a chronic large pericardial effusion and smaller bilateral effusions noted.  She was given 1 L fluid bolus in the ED and was started on azithromycin and Rocephin.  Review of Systems: Cannot be obtained given patient condition.  Past Medical History:  Diagnosis Date   Acute heart failure (HCC)    Aortic stenosis    CHF (congestive heart failure) (HCC)    Complication of anesthesia    Hard to wake patient up after having anesthesia   Diabetes mellitus without complication (HCC)    Diabetic neuropathy (HCC)    Takes Lyrica   Edema of both legs    Takes Lasix   GERD (gastroesophageal reflux disease)    High cholesterol    HTN (hypertension)    Hypertension    PAD (peripheral artery disease) (HCC)    Shortness of breath dyspnea    Spasm of back muscles    Stroke (HCC)    Wound, open    Right great toe    Past Surgical History:  Procedure Laterality Date   AMPUTATION TOE Left  06/09/2019   Procedure: Left third toe and partial great toe amputation and debridement;  Surgeon: Renford Dills, MD;  Location: [facility];  Service: Vascular;  Laterality: Left;   AMPUTATION TOE Left 04/06/2020   Procedure: AMPUTATION LEFT SECOND TOE;  Surgeon: Gwyneth Revels, DPM;  Location: [facility];  Service: Podiatry;  Laterality: Left;   BYPASS GRAFT POPLITEAL TO TIBIAL Right 02/28/2016   Procedure: BYPASS GRAFT RIGHT BELOW KNEE POPLITEAL TO PERONEAL USING REVERSED RIGHT GREATER SAPHENOUS VEIN;  Surgeon: Fransisco Hertz, MD;  Location: [facility];  Service: Vascular;  Laterality: Right;   CYST EXCISION     Abdomen   EYE SURGERY Bilateral    Cataract removal   INTRAMEDULLARY (IM) NAIL INTERTROCHANTERIC Left 02/04/2014   Procedure: INTRAMEDULLARY (IM) NAIL INTERTROCHANTRIC FEMORAL;  Surgeon: Shelda Pal, MD;  Location: [facility];  Service: Orthopedics;  Laterality: Left;   IR GENERIC HISTORICAL  03/01/2016   IR ANGIO INTRA EXTRACRAN SEL COM CAROTID INNOMINATE UNI R MOD SED 03/01/2016 Julieanne Cotton, MD [facility]   IR GENERIC HISTORICAL  03/01/2016   IR ENDOVASC INTRACRANIAL INF OTHER THAN THROMBO ART INC DIAG ANGIO 03/01/2016 Julieanne Cotton, MD [facility]   IR GENERIC HISTORICAL  03/01/2016   IR INTRAVSC STENT CERV CAROTID W/O EMB-PROT MOD SED INC ANGIO 03/01/2016 Julieanne Cotton, MD [facility]   IR  GENERIC HISTORICAL  06/03/2016   IR RADIOLOGIST EVAL & MGMT 06/03/2016 [facility]   LOWER EXTREMITY ANGIOGRAPHY Left 02/09/2019   Procedure: LOWER EXTREMITY ANGIOGRAPHY;  Surgeon: Renford Dills, MD;  Location: [facility];  Service: Cardiovascular;  Laterality: Left;   LOWER EXTREMITY ANGIOGRAPHY Left 03/10/2019   Procedure: LOWER EXTREMITY ANGIOGRAPHY;  Surgeon: Renford Dills, MD;  Location: [facility];  Service: Cardiovascular;  Laterality: Left;   LOWER EXTREMITY ANGIOGRAPHY Left 04/27/2019   Procedure: LOWER EXTREMITY ANGIOGRAPHY;  Surgeon: Renford Dills,  MD;  Location: [facility];  Service: Cardiovascular;  Laterality: Left;   LOWER EXTREMITY ANGIOGRAPHY Left 06/08/2019   Procedure: Lower Extremity Angiography;  Surgeon: Renford Dills, MD;  Location: [facility];  Service: Cardiovascular;  Laterality: Left;   LOWER EXTREMITY ANGIOGRAPHY Left 04/03/2020   Procedure: Lower Extremity Angiography;  Surgeon: Annice Needy, MD;  Location: [facility];  Service: Cardiovascular;  Laterality: Left;   LOWER EXTREMITY ANGIOGRAPHY Right 10/20/2020   Procedure: Lower Extremity Angiography;  Surgeon: Renford Dills, MD;  Location: [facility];  Service: Cardiovascular;  Laterality: Right;   LOWER EXTREMITY ANGIOGRAPHY Left 01/23/2021   Procedure: LOWER EXTREMITY ANGIOGRAPHY;  Surgeon: Renford Dills, MD;  Location: [facility];  Service: Cardiovascular;  Laterality: Left;   LOWER EXTREMITY ANGIOGRAPHY Right 04/10/2021   Procedure: LOWER EXTREMITY ANGIOGRAPHY;  Surgeon: Renford Dills, MD;  Location: [facility];  Service: Cardiovascular;  Laterality: Right;   LOWER EXTREMITY ANGIOGRAPHY Right 01/28/2023   Procedure: Lower Extremity Angiography;  Surgeon: Renford Dills, MD;  Location: [facility];  Service: Cardiovascular;  Laterality: Right;   ORIF TOE FRACTURE Right 02/08/2014   Procedure: OPEN REDUCTION INTERNAL FIXATION Right METATARSAL  FRACTURE ;  Surgeon: Toni Arthurs, MD;  [facility];  Service: Orthopedics;  Laterality: Right;   PERIPHERAL VASCULAR CATHETERIZATION N/A 12/28/2015   Procedure: Abdominal Aortogram w/Lower Extremity;  Surgeon: Fransisco Hertz, MD;  Location: [facility];  Service: Cardiovascular;  Laterality: N/A;   RADIOLOGY WITH ANESTHESIA N/A 03/01/2016   Procedure: RADIOLOGY WITH ANESTHESIA;  Surgeon: Julieanne Cotton, MD;  [facility];  Service: Radiology;  Laterality: N/A;   VEIN HARVEST Right 02/28/2016   Procedure: RIGHT GREATER SAPHENOUS VEIN  HARVEST;  Surgeon: Fransisco Hertz, MD;  Location: [facility];  Service: Vascular;  Laterality: Right;     reports that she has never smoked. She has never used smokeless tobacco. She reports that she does not drink alcohol and does not use drugs.  Allergies  Allergen Reactions   Egg-Derived Products Shortness Of Breath   Iodine Anaphylaxis   Penicillins Anaphylaxis    Tolerates ceftriaxone, cefazolin Did it involve swelling of the face/tongue/throat, SOB, or low BP? Unknown Did it involve sudden or severe rash/hives, skin peeling, or any reaction on the inside of your mouth or nose? Unknown Did you need to seek medical attention at a hospital or doctor's office? Unknown When did it last happen?      Unknown If all above answers are "NO", may proceed with cephalosporin use.    Shellfish Allergy Anaphylaxis   Sulfa Antibiotics Anaphylaxis   Sulfacetamide Sodium Anaphylaxis   Sulfasalazine Anaphylaxis   Morphine And Codeine Other (See Comments)    Altered mental status    Family History  Problem Relation Age of Onset   Diabetes Other    Liver disease Mother  CVA Father    Diabetes Father    Hypertension Father     Prior to Admission medications   Medication Sig Start Date End Date Taking? Authorizing Provider  acetaminophen (TYLENOL) 500 MG tablet Take 1,000 mg by mouth every 6 (six) hours as needed for mild pain.    [provider]  amLODipine (NORVASC) 10 MG tablet Take 10 mg by mouth daily.    [provider]  apixaban (ELIQUIS) 2.5 MG TABS tablet Take 1 tablet (2.5 mg total) by mouth 2 (two) times daily. 01/28/23   Schnier, Latina Craver, MD  aspirin EC 81 MG EC tablet Take 1 tablet (81 mg total) by mouth daily. 04/29/19   Stegmayer, Cala Bradford A, PA-C  atorvastatin (LIPITOR) 20 MG tablet Take 1 tablet (20 mg total) by mouth daily. Patient taking differently: Take 20 mg by mouth at bedtime. 03/13/16   Maris Berger A, PA-C  budesonide (PULMICORT) 0.25 MG/2ML nebulizer  solution Take 2 mLs (0.25 mg total) by nebulization 2 (two) times daily. 06/10/18   Auburn Bilberry, MD  carbamide peroxide (DEBROX) 6.5 % OTIC solution Place 5 drops into both ears 2 (two) times daily as needed (ear wax).    [provider]  Carboxymethylcellul-Glycerin (LUBRICATING EYE DROPS OP) Place 1 drop into both eyes daily as needed (dry eyes).    [provider]  carvedilol (COREG) 6.25 MG tablet Take 6.25 mg by mouth 2 (two) times daily with a meal.    [provider]  cefdinir (OMNICEF) 300 MG capsule Take 1 capsule (300 mg total) by mouth 2 (two) times daily. 11/04/21   Cathren Laine, MD  cetirizine (ZYRTEC) 10 MG tablet Take 10 mg by mouth daily.    [provider]  cholecalciferol (VITAMIN D3) 25 MCG (1000 UNIT) tablet Take 1,000 Units by mouth daily.    [provider]  clopidogrel (PLAVIX) 75 MG tablet Take 1 tablet (75 mg total) by mouth daily with breakfast. 03/04/17   Fransisco Hertz, MD  diclofenac Sodium (VOLTAREN) 1 % GEL Apply 1 application topically 4 (four) times daily as needed (pain).    [provider]  docusate sodium (COLACE) 100 MG capsule Take 100 mg by mouth daily.    [provider]  doxycycline (VIBRA-TABS) 100 MG tablet Take 100 mg by mouth 2 (two) times daily. 12/18/22   [provider]  fluticasone (FLONASE) 50 MCG/ACT nasal spray Place 1 spray into both nostrils daily as needed for allergies.     [provider]  gabapentin (NEURONTIN) 300 MG capsule Take 300 mg by mouth at bedtime.    [provider]  hydrALAZINE (APRESOLINE) 25 MG tablet Take 1 tablet (25 mg total) by mouth every 8 (eight) hours. 12/28/18   Enedina Finner, MD  insulin glargine (LANTUS) 100 UNIT/ML injection Inject 0.1 mLs (10 Units total) into the skin at bedtime. Patient taking differently: Inject 18 Units into the skin at bedtime. 03/10/21   Erick Blinks, MD  magnesium hydroxide (MILK OF MAGNESIA) 400 MG/5ML  suspension Take 15 mLs by mouth daily as needed for mild constipation.    [provider]  magnesium oxide (MAG-OX) 400 MG tablet Take 400 mg by mouth 2 (two) times daily.     [provider]  modafinil (PROVIGIL) 200 MG tablet Take 200 mg by mouth daily.    [provider]  [facility] powder Apply 1 application topically daily. 03/05/21   [provider]  omeprazole (PRILOSEC) 20 MG capsule Take  1 capsule (20 mg total) by mouth daily. 04/17/18   Houston Siren, MD  polyethylene glycol (MIRALAX / GLYCOLAX) packet Take 17 g by mouth 2 (two) times daily. Patient taking differently: Take 17 g by mouth daily as needed for moderate constipation. 02/09/14   Leroy Sea, MD  potassium chloride SA (K-DUR,KLOR-CON) 20 MEQ tablet Take 20 mEq by mouth daily.    [provider]  torsemide (DEMADEX) 20 MG tablet Take 1 tablet (20 mg total) by mouth 2 (two) times daily. 12/28/18   Enedina Finner, MD  Vitamin E 180 MG CAPS Take 180 mg by mouth daily.    [provider]    Physical Exam: Vitals:   02/03/23 1330 02/03/23 1345 02/03/23 1400 02/03/23 1602  BP: (!) 149/48 (!) 181/38 (!) 168/43   Pulse: (!) 56 71 75   Resp: 20 (!) 21 18   Temp:    97.9 F (36.6 C)  TempSrc:    Oral  SpO2: 96% 96% 96%   Weight:      Height:        Constitutional: Difficult to arouse Vitals:   02/03/23 1330 02/03/23 1345 02/03/23 1400 02/03/23 1602  BP: (!) 149/48 (!) 181/38 (!) 168/43   Pulse: (!) 56 71 75   Resp: 20 (!) 21 18   Temp:    97.9 F (36.6 C)  TempSrc:    Oral  SpO2: 96% 96% 96%   Weight:      Height:       Eyes: lids and conjunctivae normal Neck: normal, supple Respiratory: clear to auscultation bilaterally. Normal respiratory effort. No accessory muscle use.  Cardiovascular: Regular rate and rhythm, no murmurs. Abdomen: no tenderness, no distention. Bowel sounds positive.  Musculoskeletal:  No edema. Skin: no rashes, lesions, ulcers.  Psychiatric:  Flat affect  Labs on Admission: I have personally reviewed following labs and imaging studies  CBC: Recent Labs  Lab 01/28/23 2007 01/28/23 2241 02/03/23 1233  WBC 13.9*  --  12.0*  NEUTROABS 12.7*  --  9.2*  HGB 10.2* 10.8* 8.8*  HCT 34.2* 35.6* 30.2*  MCV 84.7  --  86.0  PLT 224  --  276   Basic Metabolic Panel: Recent Labs  Lab 01/28/23 1310 01/28/23 2007 02/03/23 1233  NA  --  141 141  K  --  4.1 4.1  CL  --  106 107  CO2  --  24 25  GLUCOSE  --  302* 200*  BUN 13 12 15   CREATININE 0.52 0.77 0.57  CALCIUM  --  8.9 9.1  MG  --   --  2.5*   GFR: Estimated Creatinine Clearance: 57.6 mL/min (by C-G formula based on SCr of 0.57 mg/dL). Liver Function Tests: Recent Labs  Lab 01/28/23 2007 02/03/23 1233  AST 20 17  ALT 17 19  ALKPHOS 58 58  BILITOT 0.8 1.1  PROT 5.7* 6.5  ALBUMIN 2.6* 2.7*   Recent Labs  Lab 02/03/23 1233  LIPASE 24   No results for input(s): "AMMONIA" in the last 168 hours. Coagulation Profile: No results for input(s): "INR", "PROTIME" in the last 168 hours. Cardiac Enzymes: No results for input(s): "CKTOTAL", "CKMB", "CKMBINDEX", "TROPONINI" in the last 168 hours. BNP (last 3 results) No results for input(s): "PROBNP" in the last 8760 hours. HbA1C: No results for input(s): "HGBA1C" in the last 72 hours. CBG: Recent Labs  Lab 01/28/23 1212 02/03/23 1151  GLUCAP 196* 198*   Lipid Profile: No results for  input(s): "CHOL", "HDL", "LDLCALC", "TRIG", "CHOLHDL", "LDLDIRECT" in the last 72 hours. Thyroid Function Tests: No results for input(s): "TSH", "T4TOTAL", "FREET4", "T3FREE", "THYROIDAB" in the last 72 hours. Anemia Panel: No results for input(s): "VITAMINB12", "FOLATE", "FERRITIN", "TIBC", "IRON", "RETICCTPCT" in the last 72 hours. Urine analysis:    Component Value Date/Time   COLORURINE YELLOW 02/03/2023 1234   APPEARANCEUR HAZY (A) 02/03/2023 1234   LABSPEC 1.014 02/03/2023 1234   PHURINE 7.0 02/03/2023 1234   GLUCOSEU  NEGATIVE 02/03/2023 1234   HGBUR NEGATIVE 02/03/2023 1234   BILIRUBINUR NEGATIVE 02/03/2023 1234   KETONESUR 20 (A) 02/03/2023 1234   PROTEINUR 30 (A) 02/03/2023 1234   UROBILINOGEN 0.2 02/12/2014 0110   NITRITE POSITIVE (A) 02/03/2023 1234   LEUKOCYTESUR LARGE (A) 02/03/2023 1234    Radiological Exams on Admission: CT ABDOMEN PELVIS WO CONTRAST  Result Date: 02/03/2023 CLINICAL DATA:  Acute abdominal pain EXAM: CT ABDOMEN AND PELVIS WITHOUT CONTRAST TECHNIQUE: Multidetector CT imaging of the abdomen and pelvis was performed following the standard protocol without IV contrast. RADIATION DOSE REDUCTION: This exam was performed according to the departmental dose-optimization program which includes automated exposure control, adjustment of the mA and/or kV according to patient size and/or use of iterative reconstruction technique. COMPARISON:  03/06/2021 FINDINGS: Lower chest: Small bilateral pleural effusions with associated passive atelectasis. Suspected old granulomatous disease. 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 Moderate cardiomegaly. Aortic and mitral valve calcifications. Aortic and left anterior descending coronary artery atherosclerotic vascular disease. We partially image a 1.6 by 1.3 cm right lower lobe nodule on image 1 series 9, uncertain whether this represents a nodular airspace opacity or neoplastic nodule. Airway thickening noted with mild airway plugging in the lower lobes. Patchy mosaic attenuation in the right middle lobe is nonspecific but could reflect edema Nodule along the upper medial right breast measures 1.2 cm in diameter on image 17 of series 7 and was previously less solid-appearing and 0.8 cm in diameter on 03/06/2021. This could be an inflammatory lesion although malignancy is not excluded. Hepatobiliary: Cholelithiasis noted including a 0.8 cm gallstone on image 37  series 7. Pancreas: Unremarkable Spleen: Unremarkable Adrenals/Urinary Tract: Stable thickened appearance of the adrenal glands. This is not changed from 03/03/2016 and is considered benign. No further workup of the adrenal glands is currently indicated based solely on imaging appearance. Two Bosniak category 1 cysts of the left kidney have fluid density. No further imaging workup of these lesions is indicated. 2 mm right kidney lower pole nonobstructive renal calculus. 3 mm left kidney lower pole nonobstructive renal calculus. No hydronephrosis or hydroureter. Right posterior bladder diverticulum noted. Stomach/Bowel: Small type 1 hiatal hernia. Proximal gastric wall thickening is probably related to nondistention. Mild prominence of stool in the rectum, cannot exclude mild/early fecal impaction. Distal descending and sigmoid colon diverticulosis without findings of active diverticulitis. The appendix does not appear inflamed. No dilated small bowel. Vascular/Lymphatic: Atherosclerosis is present, including aortoiliac atherosclerotic disease. 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 No pathologic adenopathy is identified. Reproductive: Calcified uterine fibroids measuring up to 1.9 cm in diameter. Stable adnexal calcifications. No further workup of these lesions is indicated. Other: No supplemental non-categorized findings. Musculoskeletal: Bilateral groin hernias containing adipose tissue with some stranding along the left groin hernia and some postoperative findings along the left common iliac vasculature. Left proximal femoral IM  nail system noted. Bony demineralization.  General regional muscular atrophy. High density material within the superficial umbilicus, possibly calcification or foreign body. Lumbar spondylosis and degenerative disc disease, potential left foraminal impingement at L5-S1 due to spurring. Mild  dextroconvex lumbar scoliosis. Chronic superior endplate compression fracture at L3 with 30% loss of vertebral body height (this fracture was shown to be acute on the prior MRI from 09/13/2020). IMPRESSION: 1. Small bilateral pleural effusions with associated passive atelectasis. 2. 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 3. Moderate cardiomegaly. 4. We partially image a 1.6 by 1.3 cm right lower lobe nodule, uncertain whether this represents a nodular airspace opacity or neoplastic nodule. Consider one of the following in 3 months for both low-risk and high-risk individuals: (a) repeat chest CT, (b) follow-up PET-CT, or (c) tissue sampling. This recommendation follows the consensus statement: Guidelines for Management of Incidental Pulmonary Nodules Detected on CT Images: From the Fleischner Society 2017; Radiology 2017; 284:228-243. 5. Nodule along the upper medial right breast measures 1.2 cm in diameter and was previously less solid-appearing and 0.8 cm in diameter on 03/06/2021. This could be an inflammatory lesion although malignancy is not excluded. If the patient is not up-to-date on mammography then diagnostic mammography would be recommended. 6. Cholelithiasis. 7. Nonobstructive bilateral nephrolithiasis. 8. Small type 1 hiatal hernia. 9. Mild prominence of stool in the rectum, cannot exclude mild/early fecal impaction. 10. Distal descending and sigmoid colon diverticulosis without findings of active diverticulitis. 11. Chronic superior endplate compression fracture at L3 with 30% loss of vertebral body height. 12. Aortic, coronary, and systemic atherosclerosis. Aortic Atherosclerosis (ICD10-I70.0). Electronically Signed   By: Gaylyn Rong M.D.   On: 02/03/2023 15:38   CT Head Wo Contrast  Result Date: 02/03/2023 CLINICAL DATA:  Delirium. EXAM: CT HEAD WITHOUT CONTRAST TECHNIQUE: Contiguous axial  images were obtained from the base of the skull through the vertex without intravenous contrast. RADIATION DOSE REDUCTION: This exam was performed according to the departmental dose-optimization program which includes automated exposure control, adjustment of the mA and/or kV according to patient size and/or use of iterative reconstruction technique. COMPARISON:  Head CT 03/06/2021 FINDINGS: Brain: No evidence of acute infarction, hemorrhage, hydrocephalus, extra-axial collection or mass effect. Generalized brain atrophy. 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 Small bilateral cerebellar infarcts more extensive on the left by coronal reformats. 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 Skull: Normal. Negative for fracture or focal lesion. Sinuses/Orbits: No acute finding. IMPRESSION: 1. No acute finding. 2. 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 3. 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 Electronically Signed   By: Tiburcio Pea M.D.   On: 02/03/2023 15:27   DG Chest 1 View  Result Date: 02/03/2023 CLINICAL DATA:  Incisional bleeding. EXAM: CHEST  1 VIEW COMPARISON:  X-ray 03/06/2021. FINDINGS: Enlarged cardiopericardial silhouette with small bilateral pleural effusions. Basilar atelectasis as well. No pneumothorax or edema. Film is rotated to the left. Overlapping cardiac leads. Osteopenia. IMPRESSION: Enlarged heart.  Small pleural effusions. Electronically Signed   By: Karen Kays M.D.   On: 02/03/2023 12:44    EKG: Independently reviewed. Aflutter 4:1.  Assessment/Plan Principal Problem:   Acute metabolic encephalopathy Active Problems:   HTN (hypertension)   HLD (hyperlipidemia)   Anemia, chronic disease   Chronic diastolic CHF (congestive heart failure) (HCC)  Diabetic neuropathy (HCC)   COPD (chronic obstructive  pulmonary disease) (HCC)   GERD (gastroesophageal reflux disease)   Elevated troponin   Atherosclerosis of native arteries of extremity with rest pain (HCC)    Acute metabolic encephalopathy likely secondary to UTI -Currently quite unresponsive, check ammonia and venous blood gas -Prior history of ESBL -Started on Merrem empirically, received Rocephin in ED -Urine cultures pending -Hold sedating medications  Elevated troponin -No chest pain noted, obtain 2D echocardiogram -Continue to trend -Appreciate cardiology evaluation  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 intake  Peripheral arterial disease -Recent right lower knee angioplasty performed 6/25 for claudication -No bleeding or reported symptoms at this time -Try to continue Plavix and hold Eliquis for now until more awake and alert  Type 2 diabetes-insulin-dependent -Continue on SSI every 4 hours until reliably tolerating diet  Dyslipidemia -Continue statin  Hypertension -Continue amlodipine, Coreg, and hydralazine  COPD -No acute symptoms noted -Breathing treatments as needed  DVT prophylaxis: Full dose Lovenox, hold Eliquis for now until more awake and alert Code Status: Full Family Communication: Husband at bedside 7/1 Disposition Plan:Admit for UTI treatment, trop elevation Consults called:Cardiology Admission status: Inpatient, telemetry  Severity of Illness: The appropriate patient status for this patient is INPATIENT. Inpatient status is judged to be reasonable and necessary in order to provide the required intensity of service to ensure the patient's safety. The patient's presenting symptoms, physical exam findings, and initial radiographic and laboratory data in the context of their chronic comorbidities  is felt to place them at high risk for further clinical deterioration. Furthermore, it is not anticipated that the patient will be medically stable for discharge from the hospital within 2 midnights of admission.   * I certify that at the point of admission it is my clinical judgment that the patient will require inpatient hospital care spanning beyond 2 midnights from the point of admission due to high intensity of service, high risk for further deterioration and high frequency of surveillance required.*   Tilman Mcclaren D Breyton Vanscyoc DO [facility]  If 7PM-7AM, please contact night-coverage [email or web address]  02/03/2023, 5:08 PM

## 2023-02-03 NOTE — ED Notes (Signed)
ED TO INPATIENT HANDOFF REPORT  ED Nurse Name and Phone #: [phone]  S Name/Age/Gender [name] 82 y.o. female Room/Bed: APA12/APA12  Code Status   Code Status: Prior  Home/SNF/Other Home Patient oriented to: self Is this baseline? No   Triage Complete: Triage complete  Chief Complaint Acute metabolic encephalopathy [G93.41]  Triage Note Family reported t ems that patient has not eaten, drank, or taken medication in 3 days. Pt reportedly had a "vascular clean out" about 1 week ago. 3 days post op patient reported to ED with complaints of a bleeding incision. [facility] was unable to find any issues with the incision at that time. Pt has hx of a stroke with residual on the right side. Pt has questionable orientation but EMS did not establish a baseline with the family.     Allergies Allergies  Allergen Reactions   Egg-Derived Products Shortness Of Breath   Iodine Anaphylaxis   Penicillins Anaphylaxis    Tolerates ceftriaxone, cefazolin Did it involve swelling of the face/tongue/throat, SOB, or low BP? Unknown Did it involve sudden or severe rash/hives, skin peeling, or any reaction on the inside of your mouth or nose? Unknown Did you need to seek medical attention at a hospital or doctor's office? Unknown When did it last happen?      Unknown If all above answers are "NO", may proceed with cephalosporin use.    Shellfish Allergy Anaphylaxis   Sulfa Antibiotics Anaphylaxis   Sulfacetamide Sodium Anaphylaxis   Sulfasalazine Anaphylaxis   Morphine And Codeine Other (See Comments)    Altered mental status    Level of Care/Admitting Diagnosis ED Disposition     ED Disposition  Admit   Condition  --   Comment  Hospital Area: [facility] [[id number]]  Level of Care: Telemetry [5]  Covid Evaluation: Asymptomatic - no recent exposure (last 10 days) testing not required  Diagnosis: Acute metabolic encephalopathy [[id number]]  Admitting Physician: Erick Blinks [[id number]]   Attending Physician: Maurilio Lovely D [[id number]]  Certification:: I certify this patient will need inpatient services for at least 2 midnights  Estimated Length of Stay: 2          B Medical/Surgery History Past Medical History:  Diagnosis Date   Acute heart failure (HCC)    Aortic stenosis    CHF (congestive heart failure) (HCC)    Complication of anesthesia    Hard to wake patient up after having anesthesia   Diabetes mellitus without complication (HCC)    Diabetic neuropathy (HCC)    Takes Lyrica   Edema of both legs    Takes Lasix   GERD (gastroesophageal reflux disease)    High cholesterol    HTN (hypertension)    Hypertension    PAD (peripheral artery disease) (HCC)    Shortness of breath dyspnea    Spasm of back muscles    Stroke (HCC)    Wound, open    Right great toe   Past Surgical History:  Procedure Laterality Date   AMPUTATION TOE Left 06/09/2019   Procedure: Left third toe and partial great toe amputation and debridement;  Surgeon: Renford Dills, MD;  Location: [facility];  Service: Vascular;  Laterality: Left;   AMPUTATION TOE Left 04/06/2020   Procedure: AMPUTATION LEFT SECOND TOE;  Surgeon: Gwyneth Revels, DPM;  Location: [facility];  Service: Podiatry;  Laterality: Left;   BYPASS GRAFT POPLITEAL TO TIBIAL Right 02/28/2016   Procedure: BYPASS GRAFT RIGHT BELOW KNEE POPLITEAL TO PERONEAL USING  REVERSED RIGHT GREATER SAPHENOUS VEIN;  Surgeon: Fransisco Hertz, MD;  Location: [facility] OR;  Service: Vascular;  Laterality: Right;   CYST EXCISION     Abdomen   EYE SURGERY Bilateral    Cataract removal   INTRAMEDULLARY (IM) NAIL INTERTROCHANTERIC Left 02/04/2014   Procedure: INTRAMEDULLARY (IM) NAIL INTERTROCHANTRIC FEMORAL;  Surgeon: Shelda Pal, MD;  [facility];  Service: Orthopedics;  Laterality: Left;   IR GENERIC HISTORICAL  03/01/2016   IR ANGIO INTRA EXTRACRAN SEL COM CAROTID INNOMINATE UNI R MOD SED 03/01/2016 Julieanne Cotton, MD [facility]   IR GENERIC  HISTORICAL  03/01/2016   IR ENDOVASC INTRACRANIAL INF OTHER THAN THROMBO ART INC DIAG ANGIO 03/01/2016 Julieanne Cotton, MD [facility]   IR GENERIC HISTORICAL  03/01/2016   IR INTRAVSC STENT CERV CAROTID W/O EMB-PROT MOD SED INC ANGIO 03/01/2016 Julieanne Cotton, MD [facility]   IR GENERIC HISTORICAL  06/03/2016   IR RADIOLOGIST EVAL & MGMT 06/03/2016 [facility]   LOWER EXTREMITY ANGIOGRAPHY Left 02/09/2019   Procedure: LOWER EXTREMITY ANGIOGRAPHY;  Surgeon: Renford Dills, MD;  Location: [facility];  Service: Cardiovascular;  Laterality: Left;   LOWER EXTREMITY ANGIOGRAPHY Left 03/10/2019   Procedure: LOWER EXTREMITY ANGIOGRAPHY;  Surgeon: Renford Dills, MD;  Location: [facility];  Service: Cardiovascular;  Laterality: Left;   LOWER EXTREMITY ANGIOGRAPHY Left 04/27/2019   Procedure: LOWER EXTREMITY ANGIOGRAPHY;  Surgeon: Renford Dills, MD;  Location: [facility];  Service: Cardiovascular;  Laterality: Left;   LOWER EXTREMITY ANGIOGRAPHY Left 06/08/2019   Procedure: Lower Extremity Angiography;  Surgeon: Renford Dills, MD;  Location: [facility];  Service: Cardiovascular;  Laterality: Left;   LOWER EXTREMITY ANGIOGRAPHY Left 04/03/2020   Procedure: Lower Extremity Angiography;  Surgeon: Annice Needy, MD;  Location: [facility];  Service: Cardiovascular;  Laterality: Left;   LOWER EXTREMITY ANGIOGRAPHY Right 10/20/2020   Procedure: Lower Extremity Angiography;  Surgeon: Renford Dills, MD;  Location: [facility];  Service: Cardiovascular;  Laterality: Right;   LOWER EXTREMITY ANGIOGRAPHY Left 01/23/2021   Procedure: LOWER EXTREMITY ANGIOGRAPHY;  Surgeon: Renford Dills, MD;  Location: [facility];  Service: Cardiovascular;  Laterality: Left;   LOWER EXTREMITY ANGIOGRAPHY Right 04/10/2021   Procedure: LOWER EXTREMITY ANGIOGRAPHY;  Surgeon: Renford Dills, MD;  Location: [facility];  Service:  Cardiovascular;  Laterality: Right;   LOWER EXTREMITY ANGIOGRAPHY Right 01/28/2023   Procedure: Lower Extremity Angiography;  Surgeon: Renford Dills, MD;  Location: [facility];  Service: Cardiovascular;  Laterality: Right;   ORIF TOE FRACTURE Right 02/08/2014   Procedure: OPEN REDUCTION INTERNAL FIXATION Right METATARSAL  FRACTURE ;  Surgeon: Toni Arthurs, MD;  [facility];  Service: Orthopedics;  Laterality: Right;   PERIPHERAL VASCULAR CATHETERIZATION N/A 12/28/2015   Procedure: Abdominal Aortogram w/Lower Extremity;  Surgeon: Fransisco Hertz, MD;  Location: [facility] INVASIVE CV LAB;  Service: Cardiovascular;  Laterality: N/A;   RADIOLOGY WITH ANESTHESIA N/A 03/01/2016   Procedure: RADIOLOGY WITH ANESTHESIA;  Surgeon: Julieanne Cotton, MD;  Location[facility];  Service: Radiology;  Laterality: N/A;   VEIN HARVEST Right 02/28/2016   Procedure: RIGHT GREATER SAPHENOUS VEIN HARVEST;  Surgeon: Fransisco Hertz, MD;  Location: [facility] OR;  Service: Vascular;  Laterality: Right;     A IV Location/Drains/Wounds Patient Lines/Drains/Airways Status     Active Line/Drains/Airways     Name Placement date Placement time Site Days  Peripheral IV 02/03/23 22 G Right;Posterior Forearm 02/03/23  1000  Forearm  less than 1   External Urinary Catheter 02/03/23  1153  --  less than 1   Pressure Injury 04/01/20 Buttocks Right;Left Stage II -  Partial thickness loss of dermis presenting as a shallow open ulcer with a red, pink wound bed without slough. stage 2 and MASD 04/01/20  2045  -- 1038   Wound / Incision (Open or Dehisced) 06/02/19 Non-pressure wound;Venous stasis ulcer Toe (Comment  which one) Anterior;Right;Left;Bilateral All toes with wounds/ulcerations, some with necrosis and odor 06/02/19  2321  Toe (Comment  which one)  1342            Intake/Output Last 24 hours  Intake/Output Summary (Last 24 hours) at 02/03/2023 1656 Last data filed at 02/03/2023 1434 Gross per 24 hour  Intake 100.46 ml   Output --  Net 100.46 ml    Labs/Imaging Results for orders placed or performed during the hospital encounter of 02/03/23 (from the past 48 hour(s))  CBG monitoring, ED     Status: Abnormal   Collection Time: 02/03/23 11:51 AM  Result Value Ref Range   Glucose-Capillary 198 (H) 70 - 99 mg/dL    Comment: Glucose reference range applies only to samples taken after fasting for at least 8 hours.  CBC with Differential     Status: Abnormal   Collection Time: 02/03/23 12:33 PM  Result Value Ref Range   WBC 12.0 (H) 4.0 - 10.5 K/uL   RBC 3.51 (L) 3.87 - 5.11 MIL/uL   Hemoglobin 8.8 (L) 12.0 - 15.0 g/dL   HCT 46.9 (L) 62.9 - 52.8 %   MCV 86.0 80.0 - 100.0 fL   MCH 25.1 (L) 26.0 - 34.0 pg   MCHC 29.1 (L) 30.0 - 36.0 g/dL   RDW 41.3 (H) 24.4 - 01.0 %   Platelets 276 150 - 400 K/uL   nRBC 0.0 0.0 - 0.2 %   Neutrophils Relative % 78 %   Neutro Abs 9.2 (H) 1.7 - 7.7 K/uL   Lymphocytes Relative 10 %   Lymphs Abs 1.2 0.7 - 4.0 K/uL   Monocytes Relative 4 %   Monocytes Absolute 0.5 0.1 - 1.0 K/uL   Eosinophils Relative 7 %   Eosinophils Absolute 0.9 (H) 0.0 - 0.5 K/uL   Basophils Relative 1 %   Basophils Absolute 0.1 0.0 - 0.1 K/uL   Immature Granulocytes 0 %   Abs Immature Granulocytes 0.04 0.00 - 0.07 K/uL    Comment: Performed at [facility], [address] [place], [place] [address]  Comprehensive metabolic panel     Status: Abnormal   Collection Time: 02/03/23 12:33 PM  Result Value Ref Range   Sodium 141 135 - 145 mmol/L   Potassium 4.1 3.5 - 5.1 mmol/L   Chloride 107 98 - 111 mmol/L   CO2 25 22 - 32 mmol/L   Glucose, Bld 200 (H) 70 - 99 mg/dL    Comment: Glucose reference range applies only to samples taken after fasting for at least 8 hours.   BUN 15 8 - 23 mg/dL   Creatinine, Ser 6.64 0.44 - 1.00 mg/dL   Calcium 9.1 8.9 - 40.3 mg/dL   Total Protein 6.5 6.5 - 8.1 g/dL   Albumin 2.7 (L) 3.5 - 5.0 g/dL   AST 17 15 - 41 U/L   ALT 19 0 - 44 U/L   Alkaline Phosphatase 58 38  - 126 U/L   Total Bilirubin  1.1 0.3 - 1.2 mg/dL   GFR, Estimated >16 >10 mL/min    Comment: (NOTE) Calculated using the CKD-EPI Creatinine Equation (2021)    Anion gap 9 5 - 15    Comment: Performed at [facility], [address], [place], [place] [address]  Troponin I (High Sensitivity)     Status: Abnormal   Collection Time: 02/03/23 12:33 PM  Result Value Ref Range   Troponin I (High Sensitivity) 163 (HH) <18 ng/L    Comment: CRITICAL RESULT CALLED TO, READ BACK BY AND VERIFIED WITH FLETCHER, RN @ 1359 ON 02/03/23 C VARNER (NOTE) Elevated high sensitivity troponin I (hsTnI) values and significant  changes across serial measurements may suggest ACS but many other  chronic and acute conditions are known to elevate hsTnI results.  Refer to the "Links" section for chest pain algorithms and additional  guidance. Performed at [facility], [address], [place], [place] [address]   Lipase, blood     Status: None   Collection Time: 02/03/23 12:33 PM  Result Value Ref Range   Lipase 24 11 - 51 U/L    Comment: Performed at [facility], [address], [place], [place] [address]  Magnesium     Status: Abnormal   Collection Time: 02/03/23 12:33 PM  Result Value Ref Range   Magnesium 2.5 (H) 1.7 - 2.4 mg/dL    Comment: Performed at [facility], [address] [place], [place] [address]  Brain natriuretic peptide     Status: Abnormal   Collection Time: 02/03/23 12:33 PM  Result Value Ref Range   B Natriuretic Peptide 699.0 (H) 0.0 - 100.0 pg/mL    Comment: Performed at [facility], [address], [place], [place] [address]  Urinalysis, Routine w reflex microscopic -Urine, Clean Catch     Status: Abnormal   Collection Time: 02/03/23 12:34 PM  Result Value Ref Range   Color, Urine YELLOW YELLOW   APPearance HAZY (A) CLEAR   Specific Gravity, Urine 1.014 1.005 - 1.030   pH 7.0 5.0 - 8.0   Glucose, UA NEGATIVE NEGATIVE mg/dL   Hgb urine dipstick NEGATIVE NEGATIVE   Bilirubin Urine  NEGATIVE NEGATIVE   Ketones, ur 20 (A) NEGATIVE mg/dL   Protein, ur 30 (A) NEGATIVE mg/dL   Nitrite POSITIVE (A) NEGATIVE   Leukocytes,Ua LARGE (A) NEGATIVE   RBC / HPF 0-5 0 - 5 RBC/hpf   WBC, UA >50 0 - 5 WBC/hpf   Bacteria, UA MANY (A) NONE SEEN   Squamous Epithelial / HPF 6-10 0 - 5 /HPF   WBC Clumps PRESENT    Mucus PRESENT     Comment: Performed at [facility], [address] [place], [place] [address]  Troponin I (High Sensitivity)     Status: Abnormal   Collection Time: 02/03/23  3:23 PM  Result Value Ref Range   Troponin I (High Sensitivity) 181 (HH) <18 ng/L    Comment: CRITICAL RESULT CALLED TO, READ BACK BY AND VERIFIED WITH KERKSTOEL,S ON 02/03/23 AT 1615 BY LOY,C (NOTE) Elevated high sensitivity troponin I (hsTnI) values and significant  changes across serial measurements may suggest ACS but many other  chronic and acute conditions are known to elevate hsTnI results.  Refer to the "Links" section for chest pain algorithms and additional  guidance. Performed at [facility], [address], [place], [place] [address]    CT ABDOMEN PELVIS WO CONTRAST  Result Date: 02/03/2023 CLINICAL DATA:  Acute abdominal pain EXAM: CT ABDOMEN AND PELVIS  WITHOUT CONTRAST TECHNIQUE: Multidetector CT imaging of the abdomen and pelvis was performed following the standard protocol without IV contrast. RADIATION DOSE REDUCTION: This exam was performed according to the departmental dose-optimization program which includes automated exposure control, adjustment of the mA and/or kV according to patient size and/or use of iterative reconstruction technique. COMPARISON:  03/06/2021 FINDINGS: Lower chest: Small bilateral pleural effusions with associated passive atelectasis. Suspected old granulomatous disease. 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 Moderate cardiomegaly. Aortic and mitral valve  calcifications. Aortic and left anterior descending coronary artery atherosclerotic vascular disease. We partially image a 1.6 by 1.3 cm right lower lobe nodule on image 1 series 9, uncertain whether this represents a nodular airspace opacity or neoplastic nodule. Airway thickening noted with mild airway plugging in the lower lobes. Patchy mosaic attenuation in the right middle lobe is nonspecific but could reflect edema Nodule along the upper medial right breast measures 1.2 cm in diameter on image 17 of series 7 and was previously less solid-appearing and 0.8 cm in diameter on 03/06/2021. This could be an inflammatory lesion although malignancy is not excluded. Hepatobiliary: Cholelithiasis noted including a 0.8 cm gallstone on image 37 series 7. Pancreas: Unremarkable Spleen: Unremarkable Adrenals/Urinary Tract: Stable thickened appearance of the adrenal glands. This is not changed from 03/03/2016 and is considered benign. No further workup of the adrenal glands is currently indicated based solely on imaging appearance. Two Bosniak category 1 cysts of the left kidney have fluid density. No further imaging workup of these lesions is indicated. 2 mm right kidney lower pole nonobstructive renal calculus. 3 mm left kidney lower pole nonobstructive renal calculus. No hydronephrosis or hydroureter. Right posterior bladder diverticulum noted. Stomach/Bowel: Small type 1 hiatal hernia. Proximal gastric wall thickening is probably related to nondistention. Mild prominence of stool in the rectum, cannot exclude mild/early fecal impaction. Distal descending and sigmoid colon diverticulosis without findings of active diverticulitis. The appendix does not appear inflamed. No dilated small bowel. Vascular/Lymphatic: Atherosclerosis is present, including aortoiliac atherosclerotic disease. 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 No  pathologic adenopathy is identified. Reproductive: Calcified uterine fibroids measuring up to 1.9 cm in diameter. Stable adnexal calcifications. No further workup of these lesions is indicated. Other: No supplemental non-categorized findings. Musculoskeletal: Bilateral groin hernias containing adipose tissue with some stranding along the left groin hernia and some postoperative findings along the left common iliac vasculature. Left proximal femoral IM nail system noted. Bony demineralization.  General regional muscular atrophy. High density material within the superficial umbilicus, possibly calcification or foreign body. Lumbar spondylosis and degenerative disc disease, potential left foraminal impingement at L5-S1 due to spurring. Mild dextroconvex lumbar scoliosis. Chronic superior endplate compression fracture at L3 with 30% loss of vertebral body height (this fracture was shown to be acute on the prior MRI from 09/13/2020). IMPRESSION: 1. Small bilateral pleural effusions with associated passive atelectasis. 2. 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 3. Moderate cardiomegaly. 4. We partially image a 1.6 by 1.3 cm right lower lobe nodule, uncertain whether this represents a nodular airspace opacity or neoplastic nodule. Consider one of the following in 3 months for both low-risk and high-risk individuals: (a) repeat chest CT, (b) follow-up PET-CT, or (c) tissue sampling. This recommendation follows the consensus statement: Guidelines for  Management of Incidental Pulmonary Nodules Detected on CT Images: From the Fleischner Society 2017; Radiology 2017; 284:228-243. 5. Nodule along the upper medial right breast measures 1.2 cm in diameter and was previously less solid-appearing and 0.8 cm in diameter on 03/06/2021. This could be an inflammatory lesion although malignancy is not excluded. If the patient is not  up-to-date on mammography then diagnostic mammography would be recommended. 6. Cholelithiasis. 7. Nonobstructive bilateral nephrolithiasis. 8. Small type 1 hiatal hernia. 9. Mild prominence of stool in the rectum, cannot exclude mild/early fecal impaction. 10. Distal descending and sigmoid colon diverticulosis without findings of active diverticulitis. 11. Chronic superior endplate compression fracture at L3 with 30% loss of vertebral body height. 12. Aortic, coronary, and systemic atherosclerosis. Aortic Atherosclerosis (ICD10-I70.0). Electronically Signed   By: Gaylyn Rong M.D.   On: 02/03/2023 15:38   CT Head Wo Contrast  Result Date: 02/03/2023 CLINICAL DATA:  Delirium. EXAM: CT HEAD WITHOUT CONTRAST TECHNIQUE: Contiguous axial images were obtained from the base of the skull through the vertex without intravenous contrast. RADIATION DOSE REDUCTION: This exam was performed according to the departmental dose-optimization program which includes automated exposure control, adjustment of the mA and/or kV according to patient size and/or use of iterative reconstruction technique. COMPARISON:  Head CT 03/06/2021 FINDINGS: Brain: No evidence of acute infarction, hemorrhage, hydrocephalus, extra-axial collection or mass effect. Generalized brain atrophy. 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 Small bilateral cerebellar infarcts more extensive on the left by coronal reformats. 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 Skull: Normal. Negative for fracture or focal lesion. Sinuses/Orbits: No acute finding. IMPRESSION: 1. No acute finding. 2. 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 3. 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 Electronically Signed   By: Tiburcio Pea M.D.   On: 02/03/2023 15:27   DG Chest 1 View  Result Date:  02/03/2023 CLINICAL DATA:  Incisional bleeding. EXAM: CHEST  1 VIEW COMPARISON:  X-ray 03/06/2021. FINDINGS: Enlarged cardiopericardial silhouette with small bilateral pleural effusions. Basilar atelectasis as well. No pneumothorax or edema. Film is rotated to the left. Overlapping cardiac leads. Osteopenia. IMPRESSION: Enlarged heart.  Small pleural effusions. Electronically Signed   By: Karen Kays M.D.   On: 02/03/2023 12:44    Pending Labs Unresulted Labs (From admission, onward)     Start     Ordered   02/03/23 1655  Ammonia  Once,   R        02/03/23 1654   02/03/23 1655  Blood gas, venous  Once,   R        02/03/23 1654   02/03/23 1315  Urine Culture  Once,   URGENT       Question:  Indication  Answer:  Dysuria   02/03/23 1315            Vitals/Pain Today's Vitals   02/03/23 1330 02/03/23 1345 02/03/23 1400 02/03/23 1602  BP: (!) 149/48 (!) 181/38 (!) 168/43   Pulse: (!) 56 71 75   Resp: 20 (!) 21 18   Temp:    97.9 F (36.6 C)  TempSrc:    Oral  SpO2: 96% 96% 96%   Weight:      Height:        Isolation Precautions No active isolations  Medications Medications  azithromycin (ZITHROMAX) 500 mg in sodium chloride 0.9 % 250 mL IVPB (has no administration in time  range)  aspirin chewable tablet 324 mg (has no administration in time range)  cefTRIAXone (ROCEPHIN) 1 g in sodium chloride 0.9 % 100 mL IVPB (0 g Intravenous Stopped 02/03/23 1434)  sodium chloride 0.9 % bolus 1,000 mL (1,000 mLs Intravenous New Bag/Given 02/03/23 1350)    Mobility non ambulatory       Focused Assessments    R Recommendations: See Admitting Provider Note  Report given to:   Additional Notes:

## 2023-02-03 NOTE — ED Provider Notes (Signed)
[facility] Provider Note  CSN: [id number] Arrival date & time: 02/03/23 1115  Chief Complaint(s) Weakness  HPI [name] is a 82 y.o. female with past medical history as below, significant for CHF, DM, HTN, PAD, CVA who presents to the ED with complaint of ams.  Patient accompanied by spouse, reports for the past 2 to 3 days she has been having increased weakness, not compliant with care at home, refusing to eat, not speaking her typical level.  Similar symptoms in the past associate with urinary tract infection.  She wears a depends diaper, he has not noticed any change to her bowel or bladder function from baseline.  No fevers at home.  No vomiting.  She has been refusing meals for the past 2 to 3 days, refusing to drink her boost shakes that he feeds her frequently.  refusing medications.  Past Medical History Past Medical History:  Diagnosis Date   Acute heart failure (HCC)    Aortic stenosis    CHF (congestive heart failure) (HCC)    Complication of anesthesia    Hard to wake patient up after having anesthesia   Diabetes mellitus without complication (HCC)    Diabetic neuropathy (HCC)    Takes Lyrica   Edema of both legs    Takes Lasix   GERD (gastroesophageal reflux disease)    High cholesterol    HTN (hypertension)    Hypertension    PAD (peripheral artery disease) (HCC)    Shortness of breath dyspnea    Spasm of back muscles    Stroke (HCC)    Wound, open    Right great toe   Patient Active Problem List   Diagnosis Date Noted   Atherosclerosis of native arteries of extremity with rest pain (HCC) 04/20/2021   COVID-19 virus infection 03/07/2021   Acute febrile illness 03/07/2021   Hypoalbuminemia 03/07/2021   Hyperglycemia due to diabetes mellitus (HCC) 03/07/2021   Elevated troponin 03/07/2021   Constipation 03/07/2021   COPD (chronic obstructive pulmonary disease) (HCC) 10/20/2020   GERD (gastroesophageal reflux  disease) 10/20/2020   Physical deconditioning 10/20/2020   Cellulitis 10/19/2020   Necrotic toes (HCC)    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)    Diabetic neuropathy (HCC)    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) 06/06/2019   Peripheral arterial disease (HCC) 04/27/2019   ESBL (extended spectrum beta-lactamase) producing bacteria infection 12/25/2018   PVD (peripheral vascular disease) (HCC) 09/12/2016   Ulcer of right lower extremity (HCC) 08/29/2016   Acute on chronic diastolic heart failure (HCC) 07/08/2016   HTN (hypertension) 07/05/2016   Sensorineural hearing loss (SNHL), 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   Chronic diastolic CHF (congestive heart failure) (HCC) 03/10/2016   HLD (hyperlipidemia) 03/09/2016   Acute respiratory acidosis (HCC) 03/05/2016   Anemia, iron deficiency 03/03/2016   Cerebrovascular accident (CVA) due to thrombosis of precerebral artery (HCC) 03/01/2016   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 Authorizing Provider  acetaminophen (TYLENOL) 500 MG tablet Take 1,000 mg by mouth every 6 (six) hours as needed for mild pain.    [provider]  amLODipine (NORVASC) 10 MG tablet Take 10 mg by mouth daily.    [provider]  apixaban (ELIQUIS) 2.5 MG TABS tablet Take 1 tablet (2.5 mg total) by mouth 2 (two) times daily. 01/28/23   Schnier, Latina Craver, MD   aspirin EC 81 MG EC tablet Take 1 tablet (81 mg total) by mouth daily. 04/29/19   Stegmayer, Cala Bradford A, PA-C  atorvastatin (LIPITOR) 20 MG tablet Take 1 tablet (20 mg total) by mouth daily. Patient taking differently: Take 20 mg by mouth at bedtime. 03/13/16   Maris Berger A, PA-C  budesonide (PULMICORT) 0.25 MG/2ML nebulizer solution Take 2 mLs (0.25 mg total) by nebulization 2 (two) times daily. 06/10/18   Auburn Bilberry, MD  carbamide peroxide (DEBROX) 6.5 % OTIC solution Place 5 drops into both ears 2 (two) times daily as needed (ear wax).    [provider]  Carboxymethylcellul-Glycerin (LUBRICATING EYE DROPS OP) Place 1 drop into both eyes daily as needed (dry eyes).    [provider]  carvedilol (COREG) 6.25 MG tablet Take 6.25 mg by mouth 2 (two) times daily with a meal.    [provider]  cefdinir (OMNICEF) 300 MG capsule Take 1 capsule (300 mg total) by mouth 2 (two) times daily. 11/04/21   Cathren Laine, MD  cetirizine (ZYRTEC) 10 MG tablet Take 10 mg by mouth daily.    [provider]  cholecalciferol (VITAMIN D3) 25 MCG (1000 UNIT) tablet Take 1,000 Units by mouth daily.    [provider]  clopidogrel (PLAVIX) 75 MG tablet Take 1 tablet (75 mg total) by mouth daily with breakfast. 03/04/17   Fransisco Hertz, MD  diclofenac Sodium (VOLTAREN) 1 % GEL Apply 1 application topically 4 (four) times daily as needed (pain).    [provider]  docusate sodium (COLACE) 100 MG capsule Take 100 mg by mouth daily.    [provider]  doxycycline (VIBRA-TABS) 100 MG tablet Take 100 mg by mouth 2 (two) times daily. 12/18/22   [provider]  fluticasone (FLONASE) 50 MCG/ACT nasal spray Place 1 spray into both nostrils daily as needed for allergies.     [provider]  gabapentin (NEURONTIN) 300 MG capsule Take 300 mg by mouth at bedtime.    [provider]  hydrALAZINE (APRESOLINE) 25 MG tablet Take 1 tablet  (25 mg total) by mouth every 8 (eight) hours. 12/28/18   Enedina Finner, MD  insulin glargine (LANTUS) 100 UNIT/ML injection Inject 0.1 mLs (10 Units total) into the skin at bedtime. Patient taking differently: Inject 18 Units into the skin at bedtime. 03/10/21   Erick Blinks, MD  magnesium hydroxide (MILK OF MAGNESIA) 400 MG/5ML suspension Take 15 mLs by mouth daily as needed for mild constipation.    [provider]  magnesium oxide (MAG-OX) 400 MG tablet Take 400 mg by mouth 2 (two) times daily.     [provider]  modafinil (PROVIGIL) 200 MG tablet Take 200 mg by mouth daily.    [provider]  [facility] powder Apply 1 application topically daily. 03/05/21   [provider]  omeprazole (PRILOSEC) 20 MG capsule Take 1 capsule (20 mg total) by mouth daily. 04/17/18   Houston Siren, MD  polyethylene glycol (MIRALAX / GLYCOLAX) packet Take 17  g by mouth 2 (two) times daily. Patient taking differently: Take 17 g by mouth daily as needed for moderate constipation. 02/09/14   Leroy Sea, MD  potassium chloride SA (K-DUR,KLOR-CON) 20 MEQ tablet Take 20 mEq by mouth daily.    [provider]  torsemide (DEMADEX) 20 MG tablet Take 1 tablet (20 mg total) by mouth 2 (two) times daily. 12/28/18   Enedina Finner, MD  Vitamin E 180 MG CAPS Take 180 mg by mouth daily.    [provider]                                                                                                                                    Past Surgical History Past Surgical History:  Procedure Laterality Date   AMPUTATION TOE Left 06/09/2019   Procedure: Left third toe and partial great toe amputation and debridement;  Surgeon: Renford Dills, MD;  Location: [facility];  Service: Vascular;  Laterality: Left;   AMPUTATION TOE Left 04/06/2020   Procedure: AMPUTATION LEFT SECOND TOE;  Surgeon: Gwyneth Revels, DPM;  Location: [facility];  Service: Podiatry;  Laterality: Left;   BYPASS  GRAFT POPLITEAL TO TIBIAL Right 02/28/2016   Procedure: BYPASS GRAFT RIGHT BELOW KNEE POPLITEAL TO PERONEAL USING REVERSED RIGHT GREATER SAPHENOUS VEIN;  Surgeon: Fransisco Hertz, MD;  Location: [facility];  Service: Vascular;  Laterality: Right;   CYST EXCISION     Abdomen   EYE SURGERY Bilateral    Cataract removal   INTRAMEDULLARY (IM) NAIL INTERTROCHANTERIC Left 02/04/2014   Procedure: INTRAMEDULLARY (IM) NAIL INTERTROCHANTRIC FEMORAL;  Surgeon: Shelda Pal, MD;  Location: [facility];  Service: Orthopedics;  Laterality: Left;   IR GENERIC HISTORICAL  03/01/2016   IR ANGIO INTRA EXTRACRAN SEL COM CAROTID INNOMINATE UNI R MOD SED 03/01/2016 Julieanne Cotton, MD [facility]   IR GENERIC HISTORICAL  03/01/2016   IR ENDOVASC INTRACRANIAL INF OTHER THAN THROMBO ART INC DIAG ANGIO 03/01/2016 Julieanne Cotton, MD [facility]   IR GENERIC HISTORICAL  03/01/2016   IR INTRAVSC STENT CERV CAROTID W/O EMB-PROT MOD SED INC ANGIO 03/01/2016 Julieanne Cotton, MD [facility]   IR GENERIC HISTORICAL  06/03/2016   IR RADIOLOGIST EVAL & MGMT 06/03/2016 [facility]   LOWER EXTREMITY ANGIOGRAPHY Left 02/09/2019   Procedure: LOWER EXTREMITY ANGIOGRAPHY;  Surgeon: Renford Dills, MD;  Location: [facility]ASIVE CV [facility];  Service: Cardiovascular;  Laterality: Left;   LOWER EXTREMITY ANGIOGRAPHY Left 03/10/2019   Procedure: LOWER EXTREMITY ANGIOGRAPHY;  Surgeon: Renford Dills, MD;  Location: [facility];  Service: Cardiovascular;  Laterality: Left;   LOWER EXTREMITY ANGIOGRAPHY Left 04/27/2019   Procedure: LOWER EXTREMITY ANGIOGRAPHY;  Surgeon: Renford Dills, MD;  Location: [facility];  Service: Cardiovascular;  Laterality: Left;   LOWER EXTREMITY ANGIOGRAPHY Left 06/08/2019   Procedure: Lower Extremity Angiography;  Surgeon: Renford Dills, MD;  Location: [facility] INVASIVE  CV LAB;  Service: Cardiovascular;  Laterality: Left;   LOWER EXTREMITY ANGIOGRAPHY Left 04/03/2020   Procedure: Lower Extremity  Angiography;  Surgeon: Annice Needy, MD;  Location: [facility];  Service: Cardiovascular;  Laterality: Left;   LOWER EXTREMITY ANGIOGRAPHY Right 10/20/2020   Procedure: Lower Extremity Angiography;  Surgeon: Renford Dills, MD;  Location: [facility];  Service: Cardiovascular;  Laterality: Right;   LOWER EXTREMITY ANGIOGRAPHY Left 01/23/2021   Procedure: LOWER EXTREMITY ANGIOGRAPHY;  Surgeon: Renford Dills, MD;  Location: [facility];  Service: Cardiovascular;  Laterality: Left;   LOWER EXTREMITY ANGIOGRAPHY Right 04/10/2021   Procedure: LOWER EXTREMITY ANGIOGRAPHY;  Surgeon: Renford Dills, MD;  Location: [facility];  Service: Cardiovascular;  Laterality: Right;   LOWER EXTREMITY ANGIOGRAPHY Right 01/28/2023   Procedure: Lower Extremity Angiography;  Surgeon: Renford Dills, MD;  Location: [facility] CV [facility];  Service: Cardiovascular;  Laterality: Right;   ORIF TOE FRACTURE Right 02/08/2014   Procedure: OPEN REDUCTION INTERNAL FIXATION Right METATARSAL  FRACTURE ;  Surgeon: Toni Arthurs, MD;  [facility];  Service: Orthopedics;  Laterality: Right;   PERIPHERAL VASCULAR CATHETERIZATION N/A 12/28/2015   Procedure: Abdominal Aortogram w/Lower Extremity;  Surgeon: Fransisco Hertz, MD;  Location: [facility] INVASIVE CV LAB;  Service: Cardiovascular;  Laterality: N/A;   RADIOLOGY WITH ANESTHESIA N/A 03/01/2016   Procedure: RADIOLOGY WITH ANESTHESIA;  Surgeon: Julieanne Cotton, MD;  [facility];  Service: Radiology;  Laterality: N/A;   VEIN HARVEST Right 02/28/2016   Procedure: RIGHT GREATER SAPHENOUS VEIN HARVEST;  Surgeon: Fransisco Hertz, MD;  Location: [facility];  Service: Vascular;  Laterality: Right;   Family History Family History  Problem Relation Age of Onset   Diabetes Other    Liver disease Mother    CVA Father    Diabetes Father    Hypertension Father     Social History Social History   Tobacco Use   Smoking status: Never   Smokeless  tobacco: Never   Tobacco comments:    Never smoked  Substance Use Topics   Alcohol use: No    Alcohol/week: 0.0 standard drinks of alcohol   Drug use: No   Allergies Egg-derived products, Iodine, Penicillins, Shellfish allergy, Sulfa antibiotics, Sulfacetamide sodium, Sulfasalazine, and Morphine and codeine  Review of Systems Review of Systems  Physical Exam Vital Signs  I have reviewed the triage vital signs BP (!) 168/43   Pulse 75   Temp 100 F (37.8 C) (Oral)   Resp 18   Ht 5\' 9"  (1.753 m)   Wt 65 kg   SpO2 96%   BMI 21.16 kg/m  Physical Exam  ED Results and Treatments Labs (all labs ordered are listed, but only abnormal results are displayed) Labs Reviewed  CBC WITH DIFFERENTIAL/PLATELET - Abnormal; Notable for the following components:      Result Value   WBC 12.0 (*)    RBC 3.51 (*)    Hemoglobin 8.8 (*)    HCT 30.2 (*)    MCH 25.1 (*)    MCHC 29.1 (*)    RDW 17.7 (*)    Neutro Abs 9.2 (*)    Eosinophils Absolute 0.9 (*)    All other components within normal limits  COMPREHENSIVE METABOLIC PANEL - Abnormal; Notable for the following components:   Glucose, Bld 200 (*)    Albumin 2.7 (*)    All other components within normal limits  URINALYSIS, ROUTINE W REFLEX MICROSCOPIC -  Abnormal; Notable for the following components:   APPearance HAZY (*)    Ketones, ur 20 (*)    Protein, ur 30 (*)    Nitrite POSITIVE (*)    Leukocytes,Ua LARGE (*)    Bacteria, UA MANY (*)    All other components within normal limits  MAGNESIUM - Abnormal; Notable for the following components:   Magnesium 2.5 (*)    All other components within normal limits  BRAIN NATRIURETIC PEPTIDE - Abnormal; Notable for the following components:   B Natriuretic Peptide 699.0 (*)    All other components within normal limits  CBG MONITORING, ED - Abnormal; Notable for the following components:   Glucose-Capillary 198 (*)    All other components within normal limits  TROPONIN I (HIGH  SENSITIVITY) - Abnormal; Notable for the following components:   Troponin I (High Sensitivity) 163 (*)    All other components within normal limits  URINE CULTURE  LIPASE, BLOOD  TROPONIN I (HIGH SENSITIVITY)                                                                                                                          Radiology DG Chest 1 View  Result Date: 02/03/2023 CLINICAL DATA:  Incisional bleeding. EXAM: CHEST  1 VIEW COMPARISON:  X-ray 03/06/2021. FINDINGS: Enlarged cardiopericardial silhouette with small bilateral pleural effusions. Basilar atelectasis as well. No pneumothorax or edema. Film is rotated to the left. Overlapping cardiac leads. Osteopenia. IMPRESSION: Enlarged heart.  Small pleural effusions. Electronically Signed   By: Karen Kays M.D.   On: 02/03/2023 12:44    Pertinent labs & imaging results that were available during my care of the patient were reviewed by me and considered in my medical decision making (see MDM for details).  Medications Ordered in ED Medications  cefTRIAXone (ROCEPHIN) 1 g in sodium chloride 0.9 % 100 mL IVPB (0 g Intravenous Stopped 02/03/23 1434)  sodium chloride 0.9 % bolus 1,000 mL (1,000 mLs Intravenous New Bag/Given 02/03/23 1350)                                                                                                                                     Procedures Procedures  (including critical care time)  Medical Decision Making / ED Course    Medical Decision Making:    [name] is a 82 y.o. female ***. The complaint involves  an extensive differential diagnosis and also carries with it a high risk of complications and morbidity.  Serious etiology was considered. Ddx includes but is not limited to: ***  Complete initial physical exam performed, notably the patient  was ***.    Reviewed and confirmed nursing documentation for past medical history, family history, social history.  Vital signs reviewed.         Additional history obtained: -Additional history obtained from {[email or web address]:28072} -External records from outside source obtained and reviewed including: Chart review including previous notes, labs, imaging, consultation notes including ***   Lab Tests: -I ordered, reviewed, and interpreted labs.   The pertinent results include:   Labs Reviewed  CBC WITH DIFFERENTIAL/PLATELET - Abnormal; Notable for the following components:      Result Value   WBC 12.0 (*)    RBC 3.51 (*)    Hemoglobin 8.8 (*)    HCT 30.2 (*)    MCH 25.1 (*)    MCHC 29.1 (*)    RDW 17.7 (*)    Neutro Abs 9.2 (*)    Eosinophils Absolute 0.9 (*)    All other components within normal limits  COMPREHENSIVE METABOLIC PANEL - Abnormal; Notable for the following components:   Glucose, Bld 200 (*)    Albumin 2.7 (*)    All other components within normal limits  URINALYSIS, ROUTINE W REFLEX MICROSCOPIC - Abnormal; Notable for the following components:   APPearance HAZY (*)    Ketones, ur 20 (*)    Protein, ur 30 (*)    Nitrite POSITIVE (*)    Leukocytes,Ua LARGE (*)    Bacteria, UA MANY (*)    All other components within normal limits  MAGNESIUM - Abnormal; Notable for the following components:   Magnesium 2.5 (*)    All other components within normal limits  BRAIN NATRIURETIC PEPTIDE - Abnormal; Notable for the following components:   B Natriuretic Peptide 699.0 (*)    All other components within normal limits  CBG MONITORING, ED - Abnormal; Notable for the following components:   Glucose-Capillary 198 (*)    All other components within normal limits  TROPONIN I (HIGH SENSITIVITY) - Abnormal; Notable for the following components:   Troponin I (High Sensitivity) 163 (*)    All other components within normal limits  URINE CULTURE  LIPASE, BLOOD  TROPONIN I (HIGH SENSITIVITY)    Notable for ***  EKG   EKG Interpretation Date/Time:  Monday February 03 2023 11:28:22 EDT Ventricular Rate:  74 PR  Interval:    QRS Duration:  84 QT Interval:  393 QTC Calculation: 436 R Axis:   64  Text Interpretation: Atrial flutter with predominant 4:1 AV block Borderline repolarization abnormality Confirmed by Tanda Rockers ([id number]) on 02/03/2023 3:08:39 PM         Imaging Studies ordered: I ordered imaging studies including *** I independently visualized the following imaging with scope of interpretation limited to determining acute life threatening conditions related to emergency care; findings noted above, significant for *** I independently visualized and interpreted imaging. I agree with the radiologist interpretation   Medicines ordered and prescription drug management: Meds ordered this encounter  Medications   cefTRIAXone (ROCEPHIN) 1 g in sodium chloride 0.9 % 100 mL IVPB    Order Specific Question:   Antibiotic Indication:    Answer:   UTI   sodium chloride 0.9 % bolus 1,000 mL    -I have reviewed the patients home medicines and have made adjustments as needed  Consultations Obtained: I requested consultation with the ***,  and discussed lab and imaging findings as well as pertinent plan - they recommend: ***   Cardiac Monitoring: The patient was maintained on a cardiac monitor.  I personally viewed and interpreted the cardiac monitored which showed an underlying rhythm of: ***  Social Determinants of Health:  Diagnosis or treatment significantly limited by social determinants of health: {wssoc:28071}   Reevaluation: After the interventions noted above, I reevaluated the patient and found that they have {resolved/improved/worsened:23923::"improved"}  Co morbidities that complicate the patient evaluation  Past Medical History:  Diagnosis Date   Acute heart failure (HCC)    Aortic stenosis    CHF (congestive heart failure) (HCC)    Complication of anesthesia    Hard to wake patient up after having anesthesia   Diabetes mellitus without complication (HCC)    Diabetic  neuropathy (HCC)    Takes Lyrica   Edema of both legs    Takes Lasix   GERD (gastroesophageal reflux disease)    High cholesterol    HTN (hypertension)    Hypertension    PAD (peripheral artery disease) (HCC)    Shortness of breath dyspnea    Spasm of back muscles    Stroke (HCC)    Wound, open    Right great toe      Dispostion: Disposition decision including need for hospitalization was considered, and patient {wsdispo:28070::"discharged from emergency department."}    Final Clinical Impression(s) / ED Diagnoses Final diagnoses:  None     This chart was dictated using voice recognition software.  Despite best efforts to proofread,  errors can occur which can change the documentation meaning.

## 2023-02-03 NOTE — Progress Notes (Signed)
ANTICOAGULATION CONSULT NOTE - Initial Consult  Pharmacy Consult for Lovenox while Eliquis on hold Indication: chest pain/ACS  Allergies  Allergen Reactions   Egg-Derived Products Shortness Of Breath   Iodine Anaphylaxis   Penicillins Anaphylaxis    Tolerates ceftriaxone, cefazolin Did it involve swelling of the face/tongue/throat, SOB, or low BP? Unknown Did it involve sudden or severe rash/hives, skin peeling, or any reaction on the inside of your mouth or nose? Unknown Did you need to seek medical attention at a hospital or doctor's office? Unknown When did it last happen?      Unknown If all above answers are "NO", may proceed with cephalosporin use.    Shellfish Allergy Anaphylaxis   Sulfa Antibiotics Anaphylaxis   Sulfacetamide Sodium Anaphylaxis   Sulfasalazine Anaphylaxis   Morphine And Codeine Other (See Comments)    Altered mental status    Patient Measurements: Height: 5\' 9"  (175.3 cm) Weight: 65 kg (143 lb 4.8 oz) IBW/kg (Calculated) : 66.2  Vital Signs: Temp: 97.9 F (36.6 C) (07/01 1602) Temp Source: Oral (07/01 1602) BP: 168/43 (07/01 1400) Pulse Rate: 75 (07/01 1400)  Labs: Recent Labs    02/03/23 1233 02/03/23 1523  HGB 8.8*  --   HCT 30.2*  --   PLT 276  --   CREATININE 0.57  --   TROPONINIHS 163* 181*    Estimated Creatinine Clearance: 57.6 mL/min (by C-G formula based on SCr of 0.57 mg/dL).   Medical History: Past Medical History:  Diagnosis Date   Acute heart failure (HCC)    Aortic stenosis    CHF (congestive heart failure) (HCC)    Complication of anesthesia    Hard to wake patient up after having anesthesia   Diabetes mellitus without complication (HCC)    Diabetic neuropathy (HCC)    Takes Lyrica   Edema of both legs    Takes Lasix   GERD (gastroesophageal reflux disease)    High cholesterol    HTN (hypertension)    Hypertension    PAD (peripheral artery disease) (HCC)    Shortness of breath dyspnea    Spasm of back  muscles    Stroke (HCC)    Wound, open    Right great toe    Medications:  Scheduled:   amLODipine  10 mg Oral Daily   aspirin  324 mg Oral Once   atorvastatin  20 mg Oral QHS   budesonide  0.25 mg Nebulization BID   carvedilol  6.25 mg Oral BID WC   [START ON 02/04/2023] clopidogrel  75 mg Oral Q breakfast   docusate sodium  100 mg Oral Daily   hydrALAZINE  25 mg Oral Q8H   insulin aspart  0-9 Units Subcutaneous Q4H   modafinil  200 mg Oral Daily   pantoprazole  40 mg Oral Daily   sodium chloride flush  3 mL Intravenous Q12H   Infusions:   sodium chloride     PRN: sodium chloride, acetaminophen **OR** acetaminophen, hydrALAZINE, ondansetron **OR** ondansetron (ZOFRAN) IV, sodium chloride flush  Assessment: 82 yo female on chronic Eliquis s/p R lower knee angioplasty 01/28/23 for claudication. Pharmacy consulted to dose Lovenox while patient having difficulty taking PO. Patient also with elevated troponin and Cardiology to evaluate.  Goal of Therapy:  Anti-Xa level 0.6-1 units/ml 4hrs after LMWH dose given Monitor platelets by anticoagulation protocol: Yes   Plan:  Lovenox 1mg /kg (65mg ) SQ q12h Check CBC at least q48h Monitor for signs/symptoms of bleeding  Also consulted to dose Meropenem  for hx ESBL UTI. Urine culture this admit pending. Begin meropenem 1g IV q8h and follow renal function.  Loralee Pacas, PharmD, BCPS 02/03/2023,5:24 PM

## 2023-02-03 NOTE — ED Notes (Signed)
Confirmed bolus administration with provider.

## 2023-02-03 NOTE — ED Triage Notes (Signed)
Family reported t ems that patient has not eaten, drank, or taken medication in 3 days. Pt reportedly had a "vascular clean out" about 1 week ago. 3 days post op patient reported to ED with complaints of a bleeding incision. [facility] was unable to find any issues with the incision at that time. Pt has hx of a stroke with residual on the right side. Pt has questionable orientation but EMS did not establish a baseline with the family.

## 2023-02-03 NOTE — ED Notes (Signed)
Verbal order to administer instead if the original order for 1000. Order cannot be changed in epic. Bag is set to administer 500. Md aware

## 2023-02-03 NOTE — ED Notes (Addendum)
Spoke with [name] to let the floor and nurse know that the patient is coming upstairs. SBAR handoff was posted and the maximum time was allotted for the receiving nurse to call with questions. Total time 1 hour 10 minutes

## 2023-02-04 ENCOUNTER — Inpatient Hospital Stay (HOSPITAL_COMMUNITY): Payer: Medicare HMO

## 2023-02-04 DIAGNOSIS — I350 Nonrheumatic aortic (valve) stenosis: Secondary | ICD-10-CM

## 2023-02-04 DIAGNOSIS — R9431 Abnormal electrocardiogram [ECG] [EKG]: Secondary | ICD-10-CM

## 2023-02-04 DIAGNOSIS — I739 Peripheral vascular disease, unspecified: Secondary | ICD-10-CM | POA: Diagnosis not present

## 2023-02-04 DIAGNOSIS — I3139 Other pericardial effusion (noninflammatory): Secondary | ICD-10-CM | POA: Diagnosis not present

## 2023-02-04 DIAGNOSIS — G9341 Metabolic encephalopathy: Secondary | ICD-10-CM | POA: Diagnosis not present

## 2023-02-04 DIAGNOSIS — R7989 Other specified abnormal findings of blood chemistry: Secondary | ICD-10-CM

## 2023-02-04 LAB — BASIC METABOLIC PANEL
Anion gap: 9 (ref 5–15)
BUN: 15 mg/dL (ref 8–23)
CO2: 25 mmol/L (ref 22–32)
Calcium: 9 mg/dL (ref 8.9–10.3)
Chloride: 112 mmol/L — ABNORMAL HIGH (ref 98–111)
Creatinine, Ser: 0.39 mg/dL — ABNORMAL LOW (ref 0.44–1.00)
GFR, Estimated: >60 mL/min (ref >60)
Glucose, Bld: 112 mg/dL — ABNORMAL HIGH (ref 70–99)
Potassium: 3.5 mmol/L (ref 3.5–5.1)
Sodium: 146 mmol/L — ABNORMAL HIGH (ref 135–145)

## 2023-02-04 LAB — MAGNESIUM: Magnesium: 2.4 mg/dL (ref 1.7–2.4)

## 2023-02-04 LAB — GLUCOSE, CAPILLARY
Glucose-Capillary: 104 mg/dL — ABNORMAL HIGH (ref 70–99)
Glucose-Capillary: 127 mg/dL — ABNORMAL HIGH (ref 70–99)
Glucose-Capillary: 151 mg/dL — ABNORMAL HIGH (ref 70–99)
Glucose-Capillary: 152 mg/dL — ABNORMAL HIGH (ref 70–99)
Glucose-Capillary: 152 mg/dL — ABNORMAL HIGH (ref 70–99)
Glucose-Capillary: 190 mg/dL — ABNORMAL HIGH (ref 70–99)

## 2023-02-04 LAB — ECHOCARDIOGRAM COMPLETE
AR max vel: 1.07 cm2
AV Area VTI: 1.21 cm2
AV Area mean vel: 1.05 cm2
AV Mean grad: 11 mmHg
AV Peak grad: 22 mmHg
Ao pk vel: 2.34 m/s
Area-P 1/2: 2.62 cm2
Height: 69 in
S' Lateral: 2.1 cm
Weight: 2292.78 oz

## 2023-02-04 LAB — CBC
HCT: 27.1 % — ABNORMAL LOW (ref 36.0–46.0)
Hemoglobin: 8.3 g/dL — ABNORMAL LOW (ref 12.0–15.0)
MCH: 25.5 pg — ABNORMAL LOW (ref 26.0–34.0)
MCHC: 30.6 g/dL (ref 30.0–36.0)
MCV: 83.4 fL (ref 80.0–100.0)
Platelets: 285 10*3/uL (ref 150–400)
RBC: 3.25 MIL/uL — ABNORMAL LOW (ref 3.87–5.11)
RDW: 17.5 % — ABNORMAL HIGH (ref 11.5–15.5)
WBC: 10.6 10*3/uL — ABNORMAL HIGH (ref 4.0–10.5)
nRBC: 0 % (ref 0.0–0.2)

## 2023-02-04 LAB — RETICULOCYTES
Immature Retic Fract: 30.6 % — ABNORMAL HIGH (ref 2.3–15.9)
RBC.: 3.23 MIL/uL — ABNORMAL LOW (ref 3.87–5.11)
Retic Count, Absolute: 168.3 10*3/uL (ref 19.0–186.0)
Retic Ct Pct: 5.2 % — ABNORMAL HIGH (ref 0.4–3.1)

## 2023-02-04 LAB — IRON AND TIBC
Iron: 17 ug/dL — ABNORMAL LOW (ref 28–170)
Saturation Ratios: 7 % — ABNORMAL LOW (ref 10.4–31.8)
TIBC: 249 ug/dL — ABNORMAL LOW (ref 250–450)
UIBC: 232 ug/dL

## 2023-02-04 LAB — TROPONIN I (HIGH SENSITIVITY): Troponin I (High Sensitivity): 178 ng/L (ref <18)

## 2023-02-04 LAB — FOLATE: Folate: 21.6 ng/mL (ref >5.9)

## 2023-02-04 LAB — VITAMIN B12: Vitamin B-12: 1445 pg/mL — ABNORMAL HIGH (ref 180–914)

## 2023-02-04 LAB — URINE CULTURE: Culture: >=100000 — AB

## 2023-02-04 LAB — FERRITIN: Ferritin: 16 ng/mL (ref 11–307)

## 2023-02-04 MED ORDER — INSULIN ASPART 100 UNIT/ML IJ SOLN
0.0000 [IU] | Freq: Three times a day (TID) | INTRAMUSCULAR | Status: DC
  Administered 2023-02-04 – 2023-02-05 (×3): 2 [IU] via SUBCUTANEOUS
  Administered 2023-02-05: 3 [IU] via SUBCUTANEOUS
  Administered 2023-02-05 – 2023-02-06 (×2): 2 [IU] via SUBCUTANEOUS
  Administered 2023-02-06: 3 [IU] via SUBCUTANEOUS
  Administered 2023-02-06: 2 [IU] via SUBCUTANEOUS

## 2023-02-04 MED ORDER — INSULIN ASPART 100 UNIT/ML IJ SOLN: 0.0000 [IU] | Freq: Every day | INTRAMUSCULAR | Status: DC

## 2023-02-04 MED ORDER — ASPIRIN 81 MG PO TBEC
81.0000 mg | DELAYED_RELEASE_TABLET | Freq: Every day | ORAL | Status: DC
  Filled 2023-02-04: qty 1

## 2023-02-04 MED ORDER — APIXABAN 2.5 MG PO TABS
2.5000 mg | ORAL_TABLET | Freq: Two times a day (BID) | ORAL | Status: DC
  Administered 2023-02-05 – 2023-02-06 (×4): 2.5 mg via ORAL
  Filled 2023-02-04 (×5): qty 1

## 2023-02-04 MED ORDER — MAGNESIUM OXIDE -MG SUPPLEMENT 400 (240 MG) MG PO TABS
400.0000 mg | ORAL_TABLET | Freq: Two times a day (BID) | ORAL | Status: DC
  Administered 2023-02-05 – 2023-02-06 (×4): 400 mg via ORAL
  Filled 2023-02-04 (×5): qty 1

## 2023-02-04 MED ORDER — TORSEMIDE 20 MG PO TABS
20.0000 mg | ORAL_TABLET | Freq: Two times a day (BID) | ORAL | Status: DC
  Administered 2023-02-04 – 2023-02-06 (×5): 20 mg via ORAL
  Filled 2023-02-04 (×5): qty 1

## 2023-02-04 MED ORDER — POTASSIUM CHLORIDE CRYS ER 20 MEQ PO TBCR
20.0000 meq | EXTENDED_RELEASE_TABLET | Freq: Every day | ORAL | Status: DC
  Filled 2023-02-04: qty 1

## 2023-02-04 MED ORDER — PANTOPRAZOLE SODIUM 40 MG IV SOLR
40.0000 mg | INTRAVENOUS | Status: DC
  Administered 2023-02-04 – 2023-02-06 (×3): 40 mg via INTRAVENOUS
  Filled 2023-02-04 (×3): qty 10

## 2023-02-04 NOTE — TOC Initial Note (Addendum)
Transition of Care [facility]) - Initial/Assessment Note    Patient Details  Name: [name] MRN: [id number] Date of Birth: [birth year]  [facility] CM/SW Contact:    Elliot Gault, LCSW Phone Number: 02/04/2023, 12:08 PM  Clinical Narrative:                  Pt admitted from home. Spoke with pt's husband to complete assessment for dc planning.  Pt resides with husband. He states that they have two private duty caregivers that are there all day but not overnight. They have hospital bed, Hoyer lift, BSC, W/C/ walker, etc for DME. They use [employer] ([place] transport agency) for transportation. Husband anticipating EMS transport at dc and he is requesting [facility] at dc.  MD anticipating dc in a couple days. TOC will follow and assist with dc planning.   Expected Discharge Plan: Home w Home Health Services Barriers to Discharge: Continued Medical Work up   Patient Goals and CMS Choice Patient states their goals for this hospitalization and ongoing recovery are:: return home CMS Medicare.gov Compare Post Acute Care list provided to:: Patient Represenative (must comment) Choice offered to / list presented to : Spouse      Expected Discharge Plan and Services In-house Referral: Clinical Social Work   Post Acute Care Choice: Home Health Living arrangements for the past 2 months: Single Family Home                                      Prior Living Arrangements/Services Living arrangements for the past 2 months: Single Family Home Lives with:: Spouse Patient language and need for interpreter reviewed:: Yes Do you feel safe going back to the place where you live?: Yes      Need for Family Participation in Patient Care: Yes (Comment) Care giver support system in place?: Yes (comment) Current home services: DME, Other (comment) (private duty caregiver) Criminal Activity/Legal Involvement Pertinent to Current Situation/Hospitalization: No - Comment as  needed  Activities of Daily Living Home Assistive Devices/Equipment: Wheelchair ADL Screening (condition at time of admission) Patient's cognitive ability adequate to safely complete daily activities?: No Is the patient deaf or have difficulty hearing?: No Does the patient have difficulty seeing, even when wearing glasses/contacts?: No Does the patient have difficulty concentrating, remembering, or making decisions?: No Patient able to express need for assistance with ADLs?: No Does the patient have difficulty dressing or bathing?: Yes Independently performs ADLs?: No Communication: Dependent Is this a change from baseline?: Pre-admission baseline Dressing (OT): Dependent Is this a change from baseline?: Pre-admission baseline Grooming: Dependent Is this a change from baseline?: Pre-admission baseline Feeding: Dependent Is this a change from baseline?: Pre-admission baseline Bathing: Dependent Is this a change from baseline?: Pre-admission baseline Toileting: Dependent Is this a change from baseline?: Pre-admission baseline In/Out Bed: Dependent Is this a change from baseline?: Pre-admission baseline Walks in Home: Dependent Is this a change from baseline?: Pre-admission baseline Does the patient have difficulty walking or climbing stairs?: No Weakness of Legs: Both Weakness of Arms/Hands: Both  Permission Sought/Granted Permission sought to share information with : Facility Industrial/product designer granted to share information with : Yes, Verbal Permission Granted     Permission granted to share info w AGENCY: [facility]        Emotional Assessment       Orientation: : Oriented to Self Alcohol / Substance Use:  Not Applicable Psych Involvement: No (comment)  Admission diagnosis:  Pericardial effusion [I31.39] Encephalopathy [G93.40] Elevated troponin [R79.89] Urinary tract infection with hematuria, site unspecified [N39.0, R31.9] Acute metabolic encephalopathy  [G93.41] Pneumonia due to infectious organism, unspecified laterality, unspecified part of lung [J18.9] Patient Active Problem List   Diagnosis Date Noted   Acute metabolic encephalopathy 02/03/2023   Atherosclerosis of native arteries of extremity with rest pain (HCC) 04/20/2021   COVID-19 virus infection 03/07/2021   Acute febrile illness 03/07/2021   Hypoalbuminemia 03/07/2021   Hyperglycemia due to diabetes mellitus (HCC) 03/07/2021   Elevated troponin 03/07/2021   Constipation 03/07/2021   COPD (chronic obstructive pulmonary disease) (HCC) 10/20/2020   GERD (gastroesophageal reflux disease) 10/20/2020   Physical deconditioning 10/20/2020   Cellulitis 10/19/2020   Necrotic toes (HCC)    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)    Diabetic neuropathy (HCC)    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) 06/06/2019   Peripheral arterial disease (HCC) 04/27/2019   ESBL (extended spectrum beta-lactamase) producing bacteria infection 12/25/2018   PVD (peripheral vascular disease) (HCC) 09/12/2016   Ulcer of right lower extremity (HCC) 08/29/2016   Acute on chronic diastolic heart failure (HCC) 07/08/2016   HTN (hypertension) 07/05/2016   Sensorineural hearing loss (SNHL), 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   Chronic diastolic CHF (congestive heart failure) (HCC) 03/10/2016   HLD (hyperlipidemia) 03/09/2016   Acute respiratory acidosis (HCC) 03/05/2016   Anemia, iron deficiency 03/03/2016   Cerebrovascular accident (CVA) due to thrombosis of precerebral artery (HCC) 03/01/2016   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/01/2016   Atherosclerosis of native arteries of the extremities with gangrene (HCC) 12/08/2015   Anemia, chronic disease 04/27/2014   Unspecified hereditary and idiopathic peripheral neuropathy 03/14/2014   DM2 (diabetes mellitus, type 2) (HCC) 02/14/2014   Aortic stenosis 02/04/2014   Closed left subtrochanteric femur fracture (HCC) 02/03/2014   Hip  fracture, left (HCC) 02/03/2014   Fibula fracture 02/03/2014   MTP instability 02/03/2014   Hip fracture (HCC) 02/03/2014   PCP:  Abram Sander, MD Pharmacy:   [facility] - [place], [place] - [address] [place] [address] Phone: [phone] Fax: [phone]     Social Determinants of Health (SDOH) Social History: SDOH Screenings   Food Insecurity: No Food Insecurity (02/03/2023)  Housing: Low Risk  (02/03/2023)  Transportation Needs: No Transportation Needs (02/03/2023)  Utilities: Not At Risk (02/03/2023)  Tobacco Use: Low Risk  (02/03/2023)   SDOH Interventions:     Readmission Risk Interventions    02/04/2023   12:07 PM  Readmission Risk Prevention Plan  Transportation Screening Complete  Home Care Screening Complete  Medication Review (RN CM) Complete

## 2023-02-04 NOTE — Consult Note (Addendum)
Cardiology Consultation   Patient ID: EMORIE KNIPFER MRN: [id number]; DOB: 21-May-1941  Admit date: 02/03/2023 Date of Consult: 02/04/2023  PCP:  Abram Sander, MD   [facility] Providers Cardiologist: Previously followed by [facility] in 2020   Patient Profile:   [name] is a 82 y.o. female with a hx of PAD (prior right popliteal to tibial bypass in 2017, recent percutaneous transluminal angioplasty and stent placement to right SFA, popliteal arteries and anterior tibial artery on 01/28/2023), HFpEF, HTN, HLD, Type 2 DM, aortic stenosis and history of CVA who is being seen 02/04/2023 for the evaluation of elevated troponin values at the request of Dr. Sherryll Burger.  History of Present Illness:   [name] presented to [name] ED on 02/03/2023 for evaluation of altered mental status. Patient's family members reported that she had not consumed food, liquids or taken her medications in 3 days.    Upon arrival, she was hypertensive at 179/46. Initial labs showed WBC 12.0, Hgb 8.8 (at 10.8 on 01/28/2023), platelets 276, Na+ 141, K+ 4.1 and creatinine 0.57. Magnesium 2.5. AST 17 and ALT 19.  BNP elevated at 699. Venous ABG showed pH 7.39 with pO2 less than 31. Bicarb elevated at 29.  TSH 0.819.  Initial and repeat Hs Troponin values elevated at 109, 163, 181 and 178. UA concerning for UTI with culture pending. EKG showing NSR, HR 74 with slight ST depression along the lateral leads which is similar to prior tracings. CXR showing enlarged heart with small pleural effusions. CT Head with no acute intracranial abnormalities. 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 CT Abdomen showing small bilateral pleural effusions with passive atelectasis. 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  She was admitted for further management of acute metabolic encephalopathy in the setting of a UTI and has been started on antibiotic therapy. Did receive a 1 L fluid bolus on admission.   At the time of this encounter, no family members are currently at the bedside. The patient's nurse is present and is trying to administer morning medications but she is spitting them out. When asked about her current symptoms, she reports "hurting all over". Says she does hurt along her feet. When asked about shortness of breath, she responds "yes" but does not elaborate.   Past Medical History:  Diagnosis Date   Acute heart failure (HCC)    Aortic stenosis    CHF (congestive heart failure) (HCC)    Complication of anesthesia    Hard to wake patient up after having anesthesia   Diabetes mellitus without complication (HCC)    Diabetic neuropathy (HCC)    Takes Lyrica   Edema of both legs    Takes Lasix   GERD (gastroesophageal reflux disease)    High cholesterol    HTN (hypertension)    Hypertension    PAD (peripheral artery disease) (HCC)    Shortness of breath dyspnea    Spasm of back muscles    Stroke (HCC)    Wound, open    Right great toe    Past Surgical History:  Procedure Laterality Date   AMPUTATION TOE Left 06/09/2019   Procedure: Left third toe and partial great toe amputation and debridement;  Surgeon: Renford Dills, MD;  Location: [facility] ORS;  Service:  Vascular;  Laterality: Left;   AMPUTATION TOE Left 04/06/2020   Procedure: AMPUTATION LEFT SECOND TOE;  Surgeon: Gwyneth Revels, DPM;  Location: [facility] OR[facility];  Service: Podiatry;  Laterality: Left;   BYPASS GRAFT POPLITEAL TO TIBIAL Right 02/28/2016   Procedure: BYPASS GRAFT RIGHT BELOW KNEE POPLITEAL TO PERONEAL USING REVERSED RIGHT GREATER SAPHENOUS VEIN;  Surgeon: Fransisco Hertz, MD;  Location: [facility];  Service: Vascular;  Laterality: Right;   CYST EXCISION     Abdomen   EYE SURGERY Bilateral    Cataract removal    INTRAMEDULLARY (IM) NAIL INTERTROCHANTERIC Left 02/04/2014   Procedure: INTRAMEDULLARY (IM) NAIL INTERTROCHANTRIC FEMORAL;  Surgeon: Shelda Pal, MD;  Location: [facility];  Service: Orthopedics;  Laterality: Left;   IR GENERIC HISTORICAL  03/01/2016   IR ANGIO INTRA EXTRACRAN SEL COM CAROTID INNOMINATE UNI R MOD SED 03/01/2016 Julieanne Cotton, MD [facility]   IR GENERIC HISTORICAL  03/01/2016   IR ENDOVASC INTRACRANIAL INF OTHER THAN THROMBO ART INC DIAG ANGIO 03/01/2016 Julieanne Cotton, MD [facility]   IR GENERIC HISTORICAL  03/01/2016   IR INTRAVSC STENT CERV CAROTID W/O EMB-PROT MOD SED INC ANGIO 03/01/2016 Julieanne Cotton, MD [facility]   IR GENERIC HISTORICAL  06/03/2016   IR RADIOLOGIST EVAL & MGMT 06/03/2016 [facility]   LOWER EXTREMITY ANGIOGRAPHY Left 02/09/2019   Procedure: LOWER EXTREMITY ANGIOGRAPHY;  Surgeon: Renford Dills, MD;  Location: [facility]AS[facility] CV [facility];  Service: Cardiovascular;  Laterality: Left;   LOWER EXTREMITY ANGIOGRAPHY Left 03/10/2019   Procedure: LOWER EXTREMITY ANGIOGRAPHY;  Surgeon: Renford Dills, MD;  Location: [facility];  Service: Cardiovascular;  Laterality: Left;   LOWER EXTREMITY ANGIOGRAPHY Left 04/27/2019   Procedure: LOWER EXTREMITY ANGIOGRAPHY;  Surgeon: Renford Dills, MD;  Location: [facility];  Service: Cardiovascular;  Laterality: Left;   LOWER EXTREMITY ANGIOGRAPHY Left 06/08/2019   Procedure: Lower Extremity Angiography;  Surgeon: Renford Dills, MD;  Location: [facility];  Service: Cardiovascular;  Laterality: Left;   LOWER EXTREMITY ANGIOGRAPHY Left 04/03/2020   Procedure: Lower Extremity Angiography;  Surgeon: Annice Needy, MD;  Location: [facility];  Service: Cardiovascular;  Laterality: Left;   LOWER EXTREMITY ANGIOGRAPHY Right 10/20/2020   Procedure: Lower Extremity Angiography;  Surgeon: Renford Dills, MD;  Location: [facility];  Service: Cardiovascular;  Laterality:  Right;   LOWER EXTREMITY ANGIOGRAPHY Left 01/23/2021   Procedure: LOWER EXTREMITY ANGIOGRAPHY;  Surgeon: Renford Dills, MD;  Location: [facility];  Service: Cardiovascular;  Laterality: Left;   LOWER EXTREMITY ANGIOGRAPHY Right 04/10/2021   Procedure: LOWER EXTREMITY ANGIOGRAPHY;  Surgeon: Renford Dills, MD;  Location: [facility];  Service: Cardiovascular;  Laterality: Right;   LOWER EXTREMITY ANGIOGRAPHY Right 01/28/2023   Procedure: Lower Extremity Angiography;  Surgeon: Renford Dills, MD;  Location: [facility];  Service: Cardiovascular;  Laterality: Right;   ORIF TOE FRACTURE Right 02/08/2014   Procedure: OPEN REDUCTION INTERNAL FIXATION Right METATARSAL  FRACTURE ;  Surgeon: Toni Arthurs, MD;  Location: [facility];  Service: Orthopedics;  Laterality: Right;   PERIPHERAL VASCULAR CATHETERIZATION N/A 12/28/2015   Procedure: Abdominal Aortogram w/Lower Extremity;  Surgeon: Fransisco Hertz, MD;  Location: [facility] INVASIVE CV LAB;  Service: Cardiovascular;  Laterality: N/A;   RADIOLOGY WITH ANESTHESIA N/A 03/01/2016   Procedure: RADIOLOGY WITH ANESTHESIA;  Surgeon: Julieanne Cotton, MD;  Location: [facility];  Service: Radiology;  Laterality: N/A;   VEIN HARVEST  Right 02/28/2016   Procedure: RIGHT GREATER SAPHENOUS VEIN HARVEST;  Surgeon: Fransisco Hertz, MD;  Location[facility] OR;  Service: Vascular;  Laterality: Right;     Home Medications:  Prior to Admission medications   Medication Sig Start Date End Date Taking? Authorizing Provider  acetaminophen (TYLENOL) 500 MG tablet Take 1,000 mg by mouth every 6 (six) hours as needed for mild pain.    [provider]  amLODipine (NORVASC) 10 MG tablet Take 10 mg by mouth daily.    [provider]  apixaban (ELIQUIS) 2.5 MG TABS tablet Take 1 tablet (2.5 mg total) by mouth 2 (two) times daily. 01/28/23   Schnier, Latina Craver, MD  aspirin EC 81 MG EC tablet Take 1 tablet (81 mg total) by mouth daily. 04/29/19   Stegmayer,  Cala Bradford A, PA-C  atorvastatin (LIPITOR) 20 MG tablet Take 1 tablet (20 mg total) by mouth daily. Patient taking differently: Take 20 mg by mouth at bedtime. 03/13/16   Maris Berger A, PA-C  budesonide (PULMICORT) 0.25 MG/2ML nebulizer solution Take 2 mLs (0.25 mg total) by nebulization 2 (two) times daily. 06/10/18   Auburn Bilberry, MD  carbamide peroxide (DEBROX) 6.5 % OTIC solution Place 5 drops into both ears 2 (two) times daily as needed (ear wax).    [provider]  Carboxymethylcellul-Glycerin (LUBRICATING EYE DROPS OP) Place 1 drop into both eyes daily as needed (dry eyes).    [provider]  carvedilol (COREG) 6.25 MG tablet Take 6.25 mg by mouth 2 (two) times daily with a meal.    [provider]  cefdinir (OMNICEF) 300 MG capsule Take 1 capsule (300 mg total) by mouth 2 (two) times daily. 11/04/21   Cathren Laine, MD  cetirizine (ZYRTEC) 10 MG tablet Take 10 mg by mouth daily.    [provider]  cholecalciferol (VITAMIN D3) 25 MCG (1000 UNIT) tablet Take 1,000 Units by mouth daily.    [provider]  clopidogrel (PLAVIX) 75 MG tablet Take 1 tablet (75 mg total) by mouth daily with breakfast. 03/04/17   Fransisco Hertz, MD  diclofenac Sodium (VOLTAREN) 1 % GEL Apply 1 application topically 4 (four) times daily as needed (pain).    [provider]  docusate sodium (COLACE) 100 MG capsule Take 100 mg by mouth daily.    [provider]  doxycycline (VIBRA-TABS) 100 MG tablet Take 100 mg by mouth 2 (two) times daily. 12/18/22   [provider]  fluticasone (FLONASE) 50 MCG/ACT nasal spray Place 1 spray into both nostrils daily as needed for allergies.     [provider]  gabapentin (NEURONTIN) 300 MG capsule Take 300 mg by mouth at bedtime.    [provider]  hydrALAZINE (APRESOLINE) 25 MG tablet Take 1 tablet (25 mg total) by mouth every 8 (eight) hours. 12/28/18   Enedina Finner, MD  insulin glargine  (LANTUS) 100 UNIT/ML injection Inject 0.1 mLs (10 Units total) into the skin at bedtime. Patient taking differently: Inject 18 Units into the skin at bedtime. 03/10/21   Erick Blinks, MD  magnesium hydroxide (MILK OF MAGNESIA) 400 MG/5ML suspension Take 15 mLs by mouth daily as needed for mild constipation.    [provider]  magnesium oxide (MAG-OX) 400 MG tablet Take 400 mg by mouth 2 (two) times daily.     [provider]  modafinil (PROVIGIL) 200 MG tablet Take 200 mg by mouth daily.    [provider]  [facility] powder Apply  1 application topically daily. 03/05/21   [provider]  omeprazole (PRILOSEC) 20 MG capsule Take 1 capsule (20 mg total) by mouth daily. 04/17/18   Houston Siren, MD  polyethylene glycol (MIRALAX / GLYCOLAX) packet Take 17 g by mouth 2 (two) times daily. Patient taking differently: Take 17 g by mouth daily as needed for moderate constipation. 02/09/14   Leroy Sea, MD  potassium chloride SA (K-DUR,KLOR-CON) 20 MEQ tablet Take 20 mEq by mouth daily.    [provider]  torsemide (DEMADEX) 20 MG tablet Take 1 tablet (20 mg total) by mouth 2 (two) times daily. 12/28/18   Enedina Finner, MD  Vitamin E 180 MG CAPS Take 180 mg by mouth daily.    [provider]    Inpatient Medications: Scheduled Meds:  amLODipine  10 mg Oral Daily   atorvastatin  20 mg Oral QHS   budesonide  0.25 mg Nebulization BID   carvedilol  6.25 mg Oral BID WC   clopidogrel  75 mg Oral Q breakfast   docusate sodium  100 mg Oral Daily   enoxaparin (LOVENOX) injection  1 mg/kg Subcutaneous Q12H   hydrALAZINE  25 mg Oral Q8H   insulin aspart  0-9 Units Subcutaneous Q4H   modafinil  200 mg Oral Daily   pantoprazole  40 mg Oral Daily   sodium chloride flush  3 mL Intravenous Q12H   Continuous Infusions:  sodium chloride     meropenem (MERREM) IV 1 g (02/04/23 0108)   PRN Meds: sodium chloride, acetaminophen **OR** acetaminophen,  hydrALAZINE, ondansetron **OR** ondansetron (ZOFRAN) IV, sodium chloride flush  Allergies:    Allergies  Allergen Reactions   Egg-Derived Products Shortness Of Breath   Iodine Anaphylaxis   Penicillins Anaphylaxis    Tolerates ceftriaxone, cefazolin Did it involve swelling of the face/tongue/throat, SOB, or low BP? Unknown Did it involve sudden or severe rash/hives, skin peeling, or any reaction on the inside of your mouth or nose? Unknown Did you need to seek medical attention at a hospital or doctor's office? Unknown When did it last happen?      Unknown If all above answers are "NO", may proceed with cephalosporin use.    Shellfish Allergy Anaphylaxis   Sulfa Antibiotics Anaphylaxis   Sulfacetamide Sodium Anaphylaxis   Sulfasalazine Anaphylaxis   Morphine And Codeine Other (See Comments)    Altered mental status    Social History:   Social History   Socioeconomic History   Marital status: Married    Spouse name: Not on file   Number of children: Not on file   Years of education: Not on file   Highest education level: Not on file  Occupational History   Not on file  Tobacco Use   Smoking status: Never   Smokeless tobacco: Never   Tobacco comments:    Never smoked  Substance and Sexual Activity   Alcohol use: No    Alcohol/week: 0.0 standard drinks of alcohol   Drug use: No   Sexual activity: Never  Other Topics Concern   Not on file  Social History Narrative   Not on file   Social Determinants of Health   Financial Resource Strain: Not on file  Food Insecurity: No Food Insecurity (02/03/2023)   Hunger Vital Sign    Worried About Running Out of Food in the Last Year: Never true    Ran Out of Food in the Last Year: Never true  Transportation Needs: No Transportation Needs (02/03/2023)  PRAPARE - Administrator, Civil Service (Medical): No    Lack of Transportation (Non-Medical): No  Physical Activity: Not on file  Stress: Not on file  Social  Connections: Not on file  Intimate Partner Violence: Not At Risk (02/03/2023)   Humiliation, Afraid, Rape, and Kick questionnaire    Fear of Current or Ex-Partner: No    Emotionally Abused: No    Physically Abused: No    Sexually Abused: No    Family History:    Family History  Problem Relation Age of Onset   Diabetes Other    Liver disease Mother    CVA Father    Diabetes Father    Hypertension Father      ROS:  Please see the history of present illness.   All other ROS reviewed and negative.     Physical Exam/Data:   Vitals:   02/03/23 2140 02/04/23 0021 02/04/23 0404 02/04/23 0535  BP: (!) 163/57 (!) 142/48 (!) 155/48 (!) 172/56  Pulse: 75 69 71 70  Resp:  16 16   Temp:  97.8 F (36.6 C) (!) 97.4 F (36.3 C)   TempSrc:  Oral Oral   SpO2: 96% 98% 98%   Weight:      Height:        Intake/Output Summary (Last 24 hours) at 02/04/2023 0748 Last data filed at 02/04/2023 0300 Gross per 24 hour  Intake 123.46 ml  Output 200 ml  Net -76.54 ml      02/03/2023   11:35 AM 01/28/2023   11:41 AM 01/01/2023    2:07 PM  Last 3 Weights  Weight (lbs) 143 lb 4.8 oz 144 lb 145 lb  Weight (kg) 65 kg 65.318 kg 65.772 kg     Body mass index is 21.16 kg/m.  General: Elderly female appearing in no acute distress.  HEENT: normal Neck: no JVD Vascular: No carotid bruits; Distal pulses 2+ bilaterally Cardiac:  normal S1, S2; RRR; 2/6 SEM along RUSB. Lungs:  decreased breath sounds along bases bilaterally.  Abd: soft, nontender, no hepatomegaly  Ext: 1+ pitting edema Musculoskeletal: Dressing in place along right foot.  Skin: warm and dry  Neuro:  CNs 2-12 intact, no focal abnormalities noted Psych: Will answer questions intermittently.   EKG:  The EKG was personally reviewed and demonstrates: NSR, HR 74 with slight ST depression along the lateral leads Telemetry:  Telemetry was personally reviewed and demonstrates: NSR, HR in 60's to 70's. Occasional PVC's and 5 beats NSVT.    Relevant CV Studies:  Echocardiogram: 03/2021 IMPRESSIONS     1. Left ventricular ejection fraction, by estimation, is 70 to 75%. The  left ventricle has hyperdynamic function. The left ventricle has no  regional wall motion abnormalities. There is severe asymmetric left  ventricular hypertrophy of the septal  segment. No significant LVOT gradient. Left ventricular diastolic  parameters are consistent with Grade I diastolic dysfunction (impaired  relaxation).   2. RV-RA gradient normal at 16 mmHg. Right ventricular systolic function  is normal. The right ventricular size is normal.   3. Left atrial size was mildly dilated.   4. The mitral valve is abnormal, mildly thickened and calcified. Trivial  mitral valve regurgitation. The mean mitral valve gradient is 3.0 mmHg.  Moderate mitral annular calcification.   5. The aortic valve is tricuspid. There is moderate calcification of the  aortic valve. Aortic valve regurgitation is not visualized. Moderate  aortic valve stenosis. Aortic valve area, by VTI measures 1.43  cm. Aortic  valve mean gradient measures 9.5  mmHg. Dimentionless index 0.45.   6. Unable to estimate CVP.   Comparison(s): Prior images reviewed side by side. Aortic valve gradients  measure less, but still overall consistent with moderate aortic stenosis.   Laboratory Data:  High Sensitivity Troponin:   Recent Labs  Lab 02/03/23 1233 02/03/23 1523 02/04/23 0430  TROPONINIHS 163* 181* 178*     Chemistry Recent Labs  Lab 01/28/23 2007 02/03/23 1233 02/04/23 0430  NA 141 141 146*  K 4.1 4.1 3.5  CL 106 107 112*  CO2 24 25 25   GLUCOSE 302* 200* 112*  BUN 12 15 15   CREATININE 0.77 0.57 0.39*  CALCIUM 8.9 9.1 9.0  MG  --  2.5* 2.4  GFRNONAA >60 >60 >60  ANIONGAP 11 9 9     Recent Labs  Lab 01/28/23 2007 02/03/23 1233  PROT 5.7* 6.5  ALBUMIN 2.6* 2.7*  AST 20 17  ALT 17 19  ALKPHOS 58 58  BILITOT 0.8 1.1   Lipids No results for input(s):  "CHOL", "TRIG", "HDL", "LABVLDL", "LDLCALC", "CHOLHDL" in the last 168 hours.  Hematology Recent Labs  Lab 01/28/23 2007 01/28/23 2241 02/03/23 1233 02/04/23 0430  WBC 13.9*  --  12.0* 10.6*  RBC 4.04  --  3.51* 3.25*  3.23*  HGB 10.2* 10.8* 8.8* 8.3*  HCT 34.2* 35.6* 30.2* 27.1*  MCV 84.7  --  86.0 83.4  MCH 25.2*  --  25.1* 25.5*  MCHC 29.8*  --  29.1* 30.6  RDW 16.6*  --  17.7* 17.5*  PLT 224  --  276 285   Thyroid  Recent Labs  Lab 02/03/23 1737  TSH 0.819    BNP Recent Labs  Lab 02/03/23 1233  BNP 699.0*    DDimer No results for input(s): "DDIMER" in the last 168 hours.   Radiology/Studies:  CT ABDOMEN PELVIS WO CONTRAST  Result Date: 02/03/2023 CLINICAL DATA:  Acute abdominal pain EXAM: CT ABDOMEN AND PELVIS WITHOUT CONTRAST TECHNIQUE: Multidetector CT imaging of the abdomen and pelvis was performed following the standard protocol without IV contrast. RADIATION DOSE REDUCTION: This exam was performed according to the departmental dose-optimization program which includes automated exposure control, adjustment of the mA and/or kV according to patient size and/or use of iterative reconstruction technique. COMPARISON:  03/06/2021 FINDINGS: Lower chest: Small bilateral pleural effusions with associated passive atelectasis. Suspected old granulomatous disease. 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 Moderate cardiomegaly. Aortic and mitral valve calcifications. Aortic and left anterior descending coronary artery atherosclerotic vascular disease. We partially image a 1.6 by 1.3 cm right lower lobe nodule on image 1 series 9, uncertain whether this represents a nodular airspace opacity or neoplastic nodule. Airway thickening noted with mild airway plugging in the lower lobes. Patchy mosaic attenuation in the right middle lobe is nonspecific but could reflect edema Nodule along the  upper medial right breast measures 1.2 cm in diameter on image 17 of series 7 and was previously less solid-appearing and 0.8 cm in diameter on 03/06/2021. This could be an inflammatory lesion although malignancy is not excluded. Hepatobiliary: Cholelithiasis noted including a 0.8 cm gallstone on image 37 series 7. Pancreas: Unremarkable Spleen: Unremarkable Adrenals/Urinary Tract: Stable thickened appearance of the adrenal glands. This is not changed from 03/03/2016 and is considered benign. No further workup of the adrenal glands is currently indicated based solely on imaging appearance. Two Bosniak category 1  cysts of the left kidney have fluid density. No further imaging workup of these lesions is indicated. 2 mm right kidney lower pole nonobstructive renal calculus. 3 mm left kidney lower pole nonobstructive renal calculus. No hydronephrosis or hydroureter. Right posterior bladder diverticulum noted. Stomach/Bowel: Small type 1 hiatal hernia. Proximal gastric wall thickening is probably related to nondistention. Mild prominence of stool in the rectum, cannot exclude mild/early fecal impaction. Distal descending and sigmoid colon diverticulosis without findings of active diverticulitis. The appendix does not appear inflamed. No dilated small bowel. Vascular/Lymphatic: Atherosclerosis is present, including aortoiliac atherosclerotic disease. 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 No pathologic adenopathy is identified. Reproductive: Calcified uterine fibroids measuring up to 1.9 cm in diameter. Stable adnexal calcifications. No further workup of these lesions is indicated. Other: No supplemental non-categorized findings. Musculoskeletal: Bilateral groin hernias containing adipose tissue with some stranding along the left groin hernia and some postoperative findings along the left common iliac vasculature. Left proximal  femoral IM nail system noted. Bony demineralization.  General regional muscular atrophy. High density material within the superficial umbilicus, possibly calcification or foreign body. Lumbar spondylosis and degenerative disc disease, potential left foraminal impingement at L5-S1 due to spurring. Mild dextroconvex lumbar scoliosis. Chronic superior endplate compression fracture at L3 with 30% loss of vertebral body height (this fracture was shown to be acute on the prior MRI from 09/13/2020). IMPRESSION: 1. Small bilateral pleural effusions with associated passive atelectasis. 2. 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 3. Moderate cardiomegaly. 4. We partially image a 1.6 by 1.3 cm right lower lobe nodule, uncertain whether this represents a nodular airspace opacity or neoplastic nodule. Consider one of the following in 3 months for both low-risk and high-risk individuals: (a) repeat chest CT, (b) follow-up PET-CT, or (c) tissue sampling. This recommendation follows the consensus statement: Guidelines for Management of Incidental Pulmonary Nodules Detected on CT Images: From the Fleischner Society 2017; Radiology 2017; 284:228-243. 5. Nodule along the upper medial right breast measures 1.2 cm in diameter and was previously less solid-appearing and 0.8 cm in diameter on 03/06/2021. This could be an inflammatory lesion although malignancy is not excluded. If the patient is not up-to-date on mammography then diagnostic mammography would be recommended. 6. Cholelithiasis. 7. Nonobstructive bilateral nephrolithiasis. 8. Small type 1 hiatal hernia. 9. Mild prominence of stool in the rectum, cannot exclude mild/early fecal impaction. 10. Distal descending and sigmoid colon diverticulosis without findings of active diverticulitis. 11. Chronic superior endplate compression fracture at L3 with 30% loss of vertebral body  height. 12. Aortic, coronary, and systemic atherosclerosis. Aortic Atherosclerosis (ICD10-I70.0). Electronically Signed   By: Gaylyn Rong M.D.   On: 02/03/2023 15:38   CT Head Wo Contrast  Result Date: 02/03/2023 CLINICAL DATA:  Delirium. EXAM: CT HEAD WITHOUT CONTRAST TECHNIQUE: Contiguous axial images were obtained from the base of the skull through the vertex without intravenous contrast. RADIATION DOSE REDUCTION: This exam was performed according to the departmental dose-optimization program which includes automated exposure control, adjustment of the mA and/or kV according to patient size and/or use of iterative reconstruction technique. COMPARISON:  Head CT 03/06/2021 FINDINGS: Brain: No evidence of acute infarction, hemorrhage, hydrocephalus, extra-axial collection or mass effect. Generalized brain atrophy. 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 Small bilateral cerebellar infarcts more extensive on the left by coronal reformats. Stable  10 mm calcified meningioma in the low right posterior fossa Vascular: No hyperdense vessel or unexpected calcification. Skull: Normal. Negative for fracture or focal lesion. Sinuses/Orbits: No acute finding. IMPRESSION: 1. No acute finding. 2. 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 3. 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 Electronically Signed   By: Tiburcio Pea M.D.   On: 02/03/2023 15:27   DG Chest 1 View  Result Date: 02/03/2023 CLINICAL DATA:  Incisional bleeding. EXAM: CHEST  1 VIEW COMPARISON:  X-ray 03/06/2021. FINDINGS: Enlarged cardiopericardial silhouette with small bilateral pleural effusions. Basilar atelectasis as well. No pneumothorax or edema. Film is rotated to the left. Overlapping cardiac leads. Osteopenia. IMPRESSION: Enlarged heart.  Small pleural effusions. Electronically Signed   By: Karen Kays M.D.   On: 02/03/2023 12:44     Assessment and  Plan:   1. Elevated Troponin Values - Her Hs Troponin values have been mildly elevated at 109, 163, 181 and 178.  Appears she has a history of this as Hs Troponin values were elevated at 109 in 2022 and Troponin I at 0.40 in 2020 and medical management was pursued. EKG showing NSR, HR 74 with slight ST depression along the lateral leads which is similar to prior tracings. BNP at 699 but she does not appear significantly volume overloaded by examination.  - While AMS limits her response to questions, she denies any specific chest pain and states she has vague pain all over her entire body. Suspect her enzyme elevation is secondary to demand ischemia. Will obtain a repeat echocardiogram to assess for any structural abnormalities. Would not anticipate an aggressive workup at this time unless her AMS significantly improves.   2. Pericardial Effusion - CT this admission read as showing a large pericardial effusion and similar to prior imaging from 2022. Echo in 2022 was read as showing no evidence of an effusion but she was noted to have a prominent pericardial fat pad. Repeat echocardiogram pending.   3. Aortic Stenosis - This was moderate by echocardiogram in 03/2021 with no follow-up in the interim. Repeat echocardiogram pending for reassessment.  4. PAD - Followed by Vascular Surgery as an outpatient and recently underwent angioplasty intervention on 01/28/2023 as described above given the non-healing wound along her right foot. Appears she was started on low-dose Eliquis 2.5mg  BID following her procedure. Currently receiving full-dose Lovenox until complaint with PO medications. Continue Plavix and statin therapy.   5. AMS/UTI - Unclear baseline as no family members currently at the bedside. Remains on antibiotic therapy. Management per the admitting team.   6. Anemia - Hgb at 8.3 today. Undergoing additional work-up by the admitting team.   For questions or updates, please contact [facility] Please consult [email or web address] for contact info under    Signed, Ellsworth Lennox, PA-C  02/04/2023 7:48 AM  Attending Note  Patient seen and discussed with PA Iran Ouch, I agree with her documentation. 82 yo feamel history of PAD with interventions as listed above, HTN, DM2, chronic diastolic HF, HLD, COPD admitted with AMS. As part of workup troponin was checked and found to be elevated, cardiology consulted. Patient altered this AM and not able to coherently answer questions about any cardiac symptoms.    WBC 12 Hgb 8.8 Plt 276 Cr 0.57 Mg 2.5 BNP 699 TSH 0.819 Trop 163-->181-->178 CXR small pleural effusions CT head: no acute process CT A/P: small bilateral effusions, large chronic pericardial effusion,  EKG SR, chronic ST/T changes  03/2021 echo: LVEF 70-75%, no WMAs, severe asymettric septal hypertrophy, mild to mod aortic stenosis AVA VTI 1.4 mean grad 9.5, DI 0.45, SVI 35  1.Elevated troponin - in setting of UTI, HTN on admission SBP to 180, chronic diastolic HF, anemia with Hgb down to 8.3 was 10.8 1 week ago.  EKG chronic ST/T changes. She is not able to give any history of symptoms due to AMS  - suspect demand ischemia, we will f/u echo. At this time no plans for ischemic testing  2.AMS/UTI - per primary team  3. Aortic stenosis - f/u repeat echo  4.Pericardial effusion - noted on CT, f/u echo  5. Anemia - per primary team     Dina Rich MD

## 2023-02-04 NOTE — Progress Notes (Signed)
PROGRESS NOTE    [name]  AVW:[id number] DOB: 1941-04-05 DOA: 02/03/2023 PCP: Abram Sander, MD   Brief Narrative:    [name] is a 82 y.o. female with medical history significant for hypertension, type 2 diabetes, chronic diastolic heart failure, dyslipidemia, COPD, and PAD with claudication with recent right lower extremity angioplasty on 6/25.  She presented to the ED with increasing weakness and refusing to eat over the last 2-3 days.  She has had similar symptoms previously related to urinary tract infection.  She appears to be more awake and alert today.  Cardiology to follow 2D echocardiogram on account of elevated troponin levels.  Assessment & Plan:   Principal Problem:   Acute metabolic encephalopathy Active Problems:   HTN (hypertension)   HLD (hyperlipidemia)   Anemia, chronic disease   Chronic diastolic CHF (congestive heart failure) (HCC)   Diabetic neuropathy (HCC)   COPD (chronic obstructive pulmonary disease) (HCC)   GERD (gastroesophageal reflux disease)   Elevated troponin   Atherosclerosis of native arteries of extremity with rest pain (HCC)  Assessment and Plan:   Acute metabolic encephalopathy likely secondary to UTI -Mentation appears to be improving, resume oral medications as well as diet -Prior history of ESBL -Started on Merrem empirically, received Rocephin in ED -Urine cultures pending -Hold sedating medications   Elevated troponin -No chest pain noted, obtain 2D echocardiogram -Continue to trend -Appreciate cardiology evaluation   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   Type 2 diabetes-insulin-dependent -Start back on diet and SSI before every meal and nightly   Dyslipidemia -Continue statin   Hypertension -Continue amlodipine, Coreg, and hydralazine   COPD -No acute symptoms noted -Breathing treatments as needed    DVT prophylaxis: Eliquis Code Status: Full Family Communication: Husband on phone 7/2 Disposition Plan:  Status is: Inpatient Remains inpatient appropriate because: Need for IV medications.   Consultants:  Cardiology  Procedures:  None  Antimicrobials:  Anti-infectives (From admission, onward)    Start     Dose/Rate Route Frequency Ordered Stop   02/04/23 0200  meropenem (MERREM) 1 g in sodium chloride 0.9 % 100 mL IVPB        1 g 200 mL/hr over 30 Minutes Intravenous Every 8 hours 02/03/23 1733     02/03/23 1800  meropenem (MERREM) 1 g in sodium chloride 0.9 % 100 mL IVPB        1 g 200 mL/hr over 30 Minutes Intravenous  Once 02/03/23 1732 02/03/23 2020   02/03/23 1600  azithromycin (ZITHROMAX) 500 mg in sodium chloride 0.9 % 250 mL IVPB  Status:  Discontinued        500 mg 250 mL/hr over 60 Minutes Intravenous Every 24 hours 02/03/23 1545 02/03/23 1718   02/03/23 1315  cefTRIAXone (ROCEPHIN) 1 g in sodium chloride 0.9 % 100 mL IVPB        1 g 200 mL/hr over 30 Minutes Intravenous  Once 02/03/23 1315 02/03/23 1434       Subjective: Patient seen and evaluated today and appears more awake and alert.  She complains of pain all over.  No other acute overnight events noted.  Objective: Vitals:   02/03/23 2140 02/04/23 0021 02/04/23 0404 02/04/23 0535  BP: (!) 163/57 (!) 142/48 (!) 155/48 (!) 172/56  Pulse: 75 69 71 70  Resp:  16 16   Temp:  97.8 F (36.6 C) (!) 97.4 F (36.3 C)   TempSrc:  Oral Oral   SpO2: 96% 98% 98%   Weight:      Height:        Intake/Output Summary (Last 24 hours) at 02/04/2023 1610 Last data filed at 02/04/2023 0300 Gross per  24 hour  Intake 123.46 ml  Output 200 ml  Net -76.54 ml   Filed Weights   02/03/23 1135  Weight: 65 kg    Examination:  General exam: Appears more awake and alert Respiratory system: Clear to auscultation. Respiratory effort normal. Cardiovascular system: S1 & S2 heard, RRR.  Gastrointestinal system: Abdomen is soft Central nervous system: Alert and awake Extremities: No edema Skin: No significant lesions noted Psychiatry: Flat affect.    Data Reviewed: I have personally reviewed following labs and imaging studies  CBC: Recent Labs  Lab 01/28/23 2007 01/28/23 2241 02/03/23 1233 02/04/23 0430  WBC 13.9*  --  12.0* 10.6*  NEUTROABS 12.7*  --  9.2*  --   HGB 10.2* 10.8* 8.8* 8.3*  HCT 34.2* 35.6* 30.2* 27.1*  MCV 84.7  --  86.0 83.4  PLT 224  --  276 285   Basic Metabolic Panel: Recent Labs  Lab 01/28/23 1310 01/28/23 2007 02/03/23 1233 02/04/23 0430  NA  --  141 141 146*  K  --  4.1 4.1 3.5  CL  --  106 107 112*  CO2  --  24 25 25   GLUCOSE  --  302* 200* 112*  BUN 13 12 15 15   CREATININE 0.52 0.77 0.57 0.39*  CALCIUM  --  8.9 9.1 9.0  MG  --   --  2.5* 2.4   GFR: Estimated Creatinine Clearance: 57.6 mL/min (A) (by C-G formula based on SCr of 0.39 mg/dL (L)). Liver Function Tests: Recent Labs  Lab 01/28/23 2007 02/03/23 1233  AST 20 17  ALT 17 19  ALKPHOS 58 58  BILITOT 0.8 1.1  PROT 5.7* 6.5  ALBUMIN 2.6* 2.7*   Recent Labs  Lab 02/03/23 1233  LIPASE 24   Recent Labs  Lab 02/03/23 1730  AMMONIA <10   Coagulation Profile: No results for input(s): "INR", "PROTIME" in the last 168 hours. Cardiac Enzymes: No results for input(s): "CKTOTAL", "CKMB", "CKMBINDEX", "TROPONINI" in the last 168 hours. BNP (last 3 results) No results for input(s): "PROBNP" in the last 8760 hours. HbA1C: Recent Labs    02/03/23 1737  HGBA1C 7.3*   CBG: Recent Labs  Lab 01/28/23 1212 02/03/23 1151 02/03/23 2024 02/04/23 0023 02/04/23 0407  GLUCAP  196* 198* 192* 151* 104*   Lipid Profile: No results for input(s): "CHOL", "HDL", "LDLCALC", "TRIG", "CHOLHDL", "LDLDIRECT" in the last 72 hours. Thyroid Function Tests: Recent Labs    02/03/23 1737  TSH 0.819   Anemia Panel: No results for input(s): "VITAMINB12", "FOLATE", "FERRITIN", "TIBC", "IRON", "RETICCTPCT" in the last 72 hours. Sepsis Labs: No results for input(s): "PROCALCITON", "LATICACIDVEN" in the last 168 hours.  No results found for this or any previous visit (from the past 240 hour(s)).       Radiology Studies: CT ABDOMEN PELVIS WO CONTRAST  Result Date: 02/03/2023 CLINICAL DATA:  Acute abdominal pain EXAM: CT ABDOMEN AND PELVIS WITHOUT CONTRAST  TECHNIQUE: Multidetector CT imaging of the abdomen and pelvis was performed following the standard protocol without IV contrast. RADIATION DOSE REDUCTION: This exam was performed according to the departmental dose-optimization program which includes automated exposure control, adjustment of the mA and/or kV according to patient size and/or use of iterative reconstruction technique. COMPARISON:  03/06/2021 FINDINGS: Lower chest: Small bilateral pleural effusions with associated passive atelectasis. Suspected old granulomatous disease. 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 Moderate cardiomegaly. Aortic and mitral valve calcifications. Aortic and left anterior descending coronary artery atherosclerotic vascular disease. We partially image a 1.6 by 1.3 cm right lower lobe nodule on image 1 series 9, uncertain whether this represents a nodular airspace opacity or neoplastic nodule. Airway thickening noted with mild airway plugging in the lower lobes. Patchy mosaic attenuation in the right middle lobe is nonspecific but could reflect edema Nodule along the upper medial right breast measures 1.2 cm in diameter on image 17 of series 7 and was  previously less solid-appearing and 0.8 cm in diameter on 03/06/2021. This could be an inflammatory lesion although malignancy is not excluded. Hepatobiliary: Cholelithiasis noted including a 0.8 cm gallstone on image 37 series 7. Pancreas: Unremarkable Spleen: Unremarkable Adrenals/Urinary Tract: Stable thickened appearance of the adrenal glands. This is not changed from 03/03/2016 and is considered benign. No further workup of the adrenal glands is currently indicated based solely on imaging appearance. Two Bosniak category 1 cysts of the left kidney have fluid density. No further imaging workup of these lesions is indicated. 2 mm right kidney lower pole nonobstructive renal calculus. 3 mm left kidney lower pole nonobstructive renal calculus. No hydronephrosis or hydroureter. Right posterior bladder diverticulum noted. Stomach/Bowel: Small type 1 hiatal hernia. Proximal gastric wall thickening is probably related to nondistention. Mild prominence of stool in the rectum, cannot exclude mild/early fecal impaction. Distal descending and sigmoid colon diverticulosis without findings of active diverticulitis. The appendix does not appear inflamed. No dilated small bowel. Vascular/Lymphatic: Atherosclerosis is present, including aortoiliac atherosclerotic disease. 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 No pathologic adenopathy is identified. Reproductive: Calcified uterine fibroids measuring up to 1.9 cm in diameter. Stable adnexal calcifications. No further workup of these lesions is indicated. Other: No supplemental non-categorized findings. Musculoskeletal: Bilateral groin hernias containing adipose tissue with some stranding along the left groin hernia and some postoperative findings along the left common iliac vasculature. Left proximal femoral IM nail system noted. Bony demineralization.  General regional muscular  atrophy. High density material within the superficial umbilicus, possibly calcification or foreign body. Lumbar spondylosis and degenerative disc disease, potential left foraminal impingement at L5-S1 due to spurring. Mild dextroconvex lumbar scoliosis. Chronic superior endplate compression fracture at L3 with 30% loss of vertebral body height (this fracture was shown to be acute on the prior MRI from 09/13/2020). IMPRESSION: 1. Small bilateral pleural effusions with associated passive atelectasis. 2. 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 3. Moderate cardiomegaly. 4. We partially image a 1.6 by 1.3 cm right lower lobe nodule, uncertain whether this represents a nodular airspace opacity or neoplastic nodule. Consider one of the following in 3 months for both low-risk and high-risk individuals: (a) repeat chest CT, (b) follow-up PET-CT, or (c) tissue sampling. This recommendation follows the consensus statement: Guidelines for Management of  Incidental Pulmonary Nodules Detected on CT Images: From the Fleischner Society 2017; Radiology 2017; 284:228-243. 5. Nodule along the upper medial right breast measures 1.2 cm in diameter and was previously less solid-appearing and 0.8 cm in diameter on 03/06/2021. This could be an inflammatory lesion although malignancy is not excluded. If the patient is not up-to-date on mammography then diagnostic mammography would be recommended. 6. Cholelithiasis. 7. Nonobstructive bilateral nephrolithiasis. 8. Small type 1 hiatal hernia. 9. Mild prominence of stool in the rectum, cannot exclude mild/early fecal impaction. 10. Distal descending and sigmoid colon diverticulosis without findings of active diverticulitis. 11. Chronic superior endplate compression fracture at L3 with 30% loss of vertebral body height. 12. Aortic, coronary, and systemic atherosclerosis. Aortic Atherosclerosis  (ICD10-I70.0). Electronically Signed   By: Gaylyn Rong M.D.   On: 02/03/2023 15:38   CT Head Wo Contrast  Result Date: 02/03/2023 CLINICAL DATA:  Delirium. EXAM: CT HEAD WITHOUT CONTRAST TECHNIQUE: Contiguous axial images were obtained from the base of the skull through the vertex without intravenous contrast. RADIATION DOSE REDUCTION: This exam was performed according to the departmental dose-optimization program which includes automated exposure control, adjustment of the mA and/or kV according to patient size and/or use of iterative reconstruction technique. COMPARISON:  Head CT 03/06/2021 FINDINGS: Brain: No evidence of acute infarction, hemorrhage, hydrocephalus, extra-axial collection or mass effect. Generalized brain atrophy. 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 Small bilateral cerebellar infarcts more extensive on the left by coronal reformats. 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 Skull: Normal. Negative for fracture or focal lesion. Sinuses/Orbits: No acute finding. IMPRESSION: 1. No acute finding. 2. 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 3. 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 Electronically Signed   By: Tiburcio Pea M.D.   On: 02/03/2023 15:27   DG Chest 1 View  Result Date: 02/03/2023 CLINICAL DATA:  Incisional bleeding. EXAM: CHEST  1 VIEW COMPARISON:  X-ray 03/06/2021. FINDINGS: Enlarged cardiopericardial silhouette with small bilateral pleural effusions. Basilar atelectasis as well. No pneumothorax or edema. Film is rotated to the left. Overlapping cardiac leads. Osteopenia. IMPRESSION: Enlarged heart.  Small pleural effusions. Electronically Signed   By: Karen Kays M.D.   On: 02/03/2023 12:44        Scheduled Meds:  amLODipine  10 mg Oral Daily   atorvastatin  20 mg Oral QHS   budesonide   0.25 mg Nebulization BID   carvedilol  6.25 mg Oral BID WC   clopidogrel  75 mg Oral Q breakfast   docusate sodium  100 mg Oral Daily   enoxaparin (LOVENOX) injection  1 mg/kg Subcutaneous Q12H   hydrALAZINE  25 mg Oral Q8H   insulin aspart  0-9 Units Subcutaneous Q4H   modafinil  200 mg Oral Daily   pantoprazole  40 mg Oral Daily   sodium chloride flush  3 mL Intravenous Q12H   Continuous Infusions:  sodium chloride     meropenem (MERREM) IV 1 g (02/04/23 0108)     LOS: 1 day    Time spent: 35 minutes    [name] Weis Hoover Brunette, DO [facility]  If 7PM-7AM, please contact night-coverage [email or web address] 02/04/2023, 7:02 AM

## 2023-02-04 NOTE — Progress Notes (Signed)
PT Cancellation Note  Patient Details Name: [name] MRN: [id number] DOB: May 08, 1941   Cancelled Treatment:    Reason Eval/Treat Not Completed: PT screened, no needs identified, will sign off Patient is at baseline, non-ambulatory, total assist for ADLs, family uses mechanical lift for transfers.  Recommend nursing staff for out of bed to chair daily as tolerated using mechanical lift for length of stay.  1:45 PM, 02/04/23 Ocie Bob, MPT Physical Therapist with [facility] [phone] office [phone] mobile phone

## 2023-02-04 NOTE — Progress Notes (Signed)
Unable to give noon meds. Pt spit them out and would not swallow them

## 2023-02-04 NOTE — Progress Notes (Signed)
  Echocardiogram 2D Echocardiogram has been performed.  [name] 02/04/2023, 9:22 AM

## 2023-02-05 DIAGNOSIS — I350 Nonrheumatic aortic (valve) stenosis: Secondary | ICD-10-CM | POA: Diagnosis not present

## 2023-02-05 DIAGNOSIS — G9341 Metabolic encephalopathy: Secondary | ICD-10-CM | POA: Diagnosis not present

## 2023-02-05 DIAGNOSIS — R7989 Other specified abnormal findings of blood chemistry: Secondary | ICD-10-CM | POA: Diagnosis not present

## 2023-02-05 DIAGNOSIS — I3139 Other pericardial effusion (noninflammatory): Secondary | ICD-10-CM | POA: Diagnosis not present

## 2023-02-05 DIAGNOSIS — I739 Peripheral vascular disease, unspecified: Secondary | ICD-10-CM | POA: Diagnosis not present

## 2023-02-05 LAB — BASIC METABOLIC PANEL
Anion gap: 9 (ref 5–15)
BUN: 13 mg/dL (ref 8–23)
CO2: 24 mmol/L (ref 22–32)
Calcium: 8.8 mg/dL — ABNORMAL LOW (ref 8.9–10.3)
Chloride: 110 mmol/L (ref 98–111)
Creatinine, Ser: 0.49 mg/dL (ref 0.44–1.00)
GFR, Estimated: >60 mL/min (ref >60)
Glucose, Bld: 168 mg/dL — ABNORMAL HIGH (ref 70–99)
Potassium: 2.9 mmol/L — ABNORMAL LOW (ref 3.5–5.1)
Sodium: 143 mmol/L (ref 135–145)

## 2023-02-05 LAB — GLUCOSE, CAPILLARY
Glucose-Capillary: 147 mg/dL — ABNORMAL HIGH (ref 70–99)
Glucose-Capillary: 154 mg/dL — ABNORMAL HIGH (ref 70–99)
Glucose-Capillary: 169 mg/dL — ABNORMAL HIGH (ref 70–99)
Glucose-Capillary: 172 mg/dL — ABNORMAL HIGH (ref 70–99)
Glucose-Capillary: 189 mg/dL — ABNORMAL HIGH (ref 70–99)
Glucose-Capillary: 203 mg/dL — ABNORMAL HIGH (ref 70–99)

## 2023-02-05 LAB — CBC
HCT: 28.4 % — ABNORMAL LOW (ref 36.0–46.0)
Hemoglobin: 8.7 g/dL — ABNORMAL LOW (ref 12.0–15.0)
MCH: 25.4 pg — ABNORMAL LOW (ref 26.0–34.0)
MCHC: 30.6 g/dL (ref 30.0–36.0)
MCV: 83 fL (ref 80.0–100.0)
Platelets: 388 10*3/uL (ref 150–400)
RBC: 3.42 MIL/uL — ABNORMAL LOW (ref 3.87–5.11)
RDW: 17.9 % — ABNORMAL HIGH (ref 11.5–15.5)
WBC: 12.7 10*3/uL — ABNORMAL HIGH (ref 4.0–10.5)
nRBC: 0 % (ref 0.0–0.2)

## 2023-02-05 LAB — MAGNESIUM: Magnesium: 2.3 mg/dL (ref 1.7–2.4)

## 2023-02-05 LAB — URINE CULTURE

## 2023-02-05 MED ORDER — POTASSIUM CHLORIDE 10 MEQ/100ML IV SOLN
10.0000 meq | INTRAVENOUS | Status: AC
  Administered 2023-02-05 (×6): 10 meq via INTRAVENOUS
  Filled 2023-02-05 (×6): qty 100

## 2023-02-05 MED ORDER — SODIUM CHLORIDE 0.9 % IV SOLN
1.0000 g | INTRAVENOUS | Status: DC
  Administered 2023-02-05: 1 g via INTRAVENOUS
  Filled 2023-02-05 (×2): qty 10

## 2023-02-05 NOTE — TOC Progression Note (Addendum)
Transition of Care [facility]) - Progression Note    Patient Details  Name: [name] MRN: [id number] Date of Birth: [birth year]  [facility] CM/SW Contact  Karn Cassis, [place] Phone Number: 02/05/2023, 11:47 AM  Clinical Narrative:  [name] with [facility] accepts for [facility]. Order in.      Expected Discharge Plan: Home w Home Health Services Barriers to Discharge: Continued Medical Work up  Expected Discharge Plan and Services In-house Referral: Clinical Social Work   Post Acute Care Choice: Home Health Living arrangements for the past 2 months: Single Family Home                                       Social Determinants of Health (SDOH) Interventions SDOH Screenings   Food Insecurity: No Food Insecurity (02/03/2023)  Housing: Low Risk  (02/03/2023)  Transportation Needs: No Transportation Needs (02/03/2023)  Utilities: Not At Risk (02/03/2023)  Tobacco Use: Low Risk  (02/03/2023)    Readmission Risk Interventions    02/04/2023   12:07 PM  Readmission Risk Prevention Plan  Transportation Screening Complete  Home Care Screening Complete  Medication Review (RN CM) Complete

## 2023-02-05 NOTE — Progress Notes (Addendum)
Rounding Note    Patient Name: [name] Date of Encounter: 02/05/2023  [facility] Cardiologist: New  Subjective   Unable to history, patient does not answer questions appropriately  Inpatient Medications    Scheduled Meds:  amLODipine  10 mg Oral Daily   apixaban  2.5 mg Oral BID   atorvastatin  20 mg Oral QHS   budesonide  0.25 mg Nebulization BID   carvedilol  6.25 mg Oral BID WC   clopidogrel  75 mg Oral Q breakfast   docusate sodium  100 mg Oral Daily   hydrALAZINE  25 mg Oral Q8H   insulin aspart  0-5 Units Subcutaneous QHS   insulin aspart  0-9 Units Subcutaneous TID WC   magnesium oxide  400 mg Oral BID   modafinil  200 mg Oral Daily   pantoprazole (PROTONIX) IV  40 mg Intravenous Q24H   sodium chloride flush  3 mL Intravenous Q12H   torsemide  20 mg Oral BID   Continuous Infusions:  sodium chloride     meropenem (MERREM) IV 1 g (02/05/23 0236)   potassium chloride 10 mEq (02/05/23 0824)   PRN Meds: sodium chloride, acetaminophen **OR** acetaminophen, hydrALAZINE, ondansetron **OR** ondansetron (ZOFRAN) IV, sodium chloride flush   Vital Signs    Vitals:   02/04/23 2147 02/05/23 0349 02/05/23 0635 02/05/23 0751  BP: (!) 150/69 103/62 (!) 150/48   Pulse: 67 71 68 88  Resp:  16  14  Temp:  (!) 97.5 F (36.4 C)    TempSrc:  Axillary    SpO2:  100%  95%  Weight:      Height:        Intake/Output Summary (Last 24 hours) at 02/05/2023 0831 Last data filed at 02/05/2023 0350 Gross per 24 hour  Intake 250 ml  Output 800 ml  Net -550 ml      02/03/2023   11:35 AM 01/28/2023   11:41 AM 01/01/2023    2:07 PM  Last 3 Weights  Weight (lbs) 143 lb 4.8 oz 144 lb 145 lb  Weight (kg) 65 kg 65.318 kg 65.772 kg      Telemetry    SR - Personally Reviewed  ECG    N/A  - Personally Reviewed  Physical Exam   GEN: No acute distress.   Neck: No JVD Cardiac: RRR, 2/6 systolic murmur rusb, no jvd Respiratory: Clear to auscultation  bilaterally. GI: Soft, nontender, non-distended  MS: No edema; No deformity. Neuro:  Nonfocal  Psych: Normal affect   Labs    High Sensitivity Troponin:   Recent Labs  Lab 02/03/23 1233 02/03/23 1523 02/04/23 0430  TROPONINIHS 163* 181* 178*     Chemistry Recent Labs  Lab 02/03/23 1233 02/04/23 0430 02/05/23 0402  NA 141 146* 143  K 4.1 3.5 2.9*  CL 107 112* 110  CO2 25 25 24   GLUCOSE 200* 112* 168*  BUN 15 15 13   CREATININE 0.57 0.39* 0.49  CALCIUM 9.1 9.0 8.8*  MG 2.5* 2.4 2.3  PROT 6.5  --   --   ALBUMIN 2.7*  --   --   AST 17  --   --   ALT 19  --   --   ALKPHOS 58  --   --   BILITOT 1.1  --   --   GFRNONAA >60 >60 >60  ANIONGAP 9 9 9     Lipids No results for input(s): "CHOL", "TRIG", "HDL", "LABVLDL", "LDLCALC", "CHOLHDL" in the  last 168 hours.  Hematology Recent Labs  Lab 02/03/23 1233 02/04/23 0430 02/05/23 0402  WBC 12.0* 10.6* 12.7*  RBC 3.51* 3.25*  3.23* 3.42*  HGB 8.8* 8.3* 8.7*  HCT 30.2* 27.1* 28.4*  MCV 86.0 83.4 83.0  MCH 25.1* 25.5* 25.4*  MCHC 29.1* 30.6 30.6  RDW 17.7* 17.5* 17.9*  PLT 276 285 388   Thyroid  Recent Labs  Lab 02/03/23 1737  TSH 0.819    BNP Recent Labs  Lab 02/03/23 1233  BNP 699.0*    DDimer No results for input(s): "DDIMER" in the last 168 hours.   Radiology    ECHOCARDIOGRAM COMPLETE  Result Date: 02/04/2023    ECHOCARDIOGRAM REPORT   Patient Name:   [name] Date of Exam: 02/04/2023 Medical Rec #:  [id number]     Height:       69.0 in Accession #:    [id number]    Weight:       143.3 lb Date of Birth:  [birth year]      BSA:          1.793 m Patient Age:    82 years      BP:           175/63 mmHg Patient Gender: F             HR:           70 bpm. [name] Procedure: 2D Echo, Cardiac Doppler and Color Doppler Indications:    Abnormal ECG R94.31  History:        Patient has prior history of Echocardiogram examinations, most                 recent 03/08/2021. CHF, Abnormal ECG, COPD, PAD and  Stroke, Aortic                 Valve Disease, Signs/Symptoms:Altered Mental Status; Risk                 Factors:Hypertension, Dyslipidemia and Diabetes.  Sonographer:    Aron Baba Referring Phys: [id number] PRATIK D [facility]  Sonographer Comments: Image acquisition challenging due to respiratory motion. IMPRESSIONS  1. Left ventricular ejection fraction, by estimation, is 65 to 70%. The left ventricle has normal function. The left ventricle has no regional wall motion abnormalities. There is moderate asymmetric left ventricular hypertrophy of the septal segment. Left ventricular diastolic parameters are consistent with Grade I diastolic dysfunction (impaired relaxation). Elevated left atrial pressure.  2. Right ventricular systolic function is normal. The right ventricular size is normal. Tricuspid regurgitation signal is inadequate for assessing PA pressure.  3. Left atrial size was severely dilated.  4. Moderate pericardial effusion. The pericardial effusion is posterior to the left ventricle. There is no evidence of cardiac tamponade.  5. The mitral valve is abnormal. Trivial mitral valve regurgitation. No evidence of mitral stenosis. Moderate mitral annular calcification.  6. The aortic valve is tricuspid. There is moderate calcification of the aortic valve. There is moderate thickening of the aortic valve. Aortic valve regurgitation is not visualized. Mild to moderate aortic valve stenosis.  7. The inferior vena cava is normal in size with greater than 50% respiratory variability, suggesting right atrial pressure of 3 mmHg. FINDINGS  Left Ventricle: Left ventricular ejection fraction, by estimation, is 65 to 70%. The left ventricle has normal function. The left ventricle has no regional wall motion abnormalities. The left ventricular internal cavity size was normal in size. There is  moderate asymmetric left ventricular hypertrophy of the septal segment. Left ventricular diastolic parameters are consistent with  Grade I diastolic dysfunction (impaired relaxation). Elevated left atrial pressure. Right Ventricle: The right ventricular size is normal. Right vetricular wall thickness was not well visualized. Right ventricular systolic function is normal. Tricuspid regurgitation signal is inadequate for assessing PA pressure. Left Atrium: Left atrial size was severely dilated. Right Atrium: Right atrial size was normal in size. Pericardium: A moderately sized pericardial effusion is present. The pericardial effusion is posterior to the left ventricle. There is no evidence of cardiac tamponade. Mitral Valve: The mitral valve is abnormal. There is mild thickening of the mitral valve leaflet(s). There is mild calcification of the mitral valve leaflet(s). Moderate mitral annular calcification. Trivial mitral valve regurgitation. No evidence of mitral valve stenosis. Tricuspid Valve: The tricuspid valve is normal in structure. Tricuspid valve regurgitation is not demonstrated. No evidence of tricuspid stenosis. Aortic Valve: The aortic valve is tricuspid. There is moderate calcification of the aortic valve. There is moderate thickening of the aortic valve. There is moderate aortic valve annular calcification. Aortic valve regurgitation is not visualized. Mild to moderate aortic stenosis is present. Aortic valve mean gradient measures 11.0 mmHg. Aortic valve peak gradient measures 22.0 mmHg. Aortic valve area, by VTI measures 1.21 cm. Pulmonic Valve: The pulmonic valve was not well visualized. Pulmonic valve regurgitation is mild. No evidence of pulmonic stenosis. Aorta: The aortic root is normal in size and structure. Venous: The inferior vena cava is normal in size with greater than 50% respiratory variability, suggesting right atrial pressure of 3 mmHg. IAS/Shunts: No atrial level shunt detected by color flow Doppler.  LEFT VENTRICLE PLAX 2D LVIDd:         3.40 cm   Diastology LVIDs:         2.10 cm   LV e' medial:    3.50 cm/s LV  PW:         1.10 cm   LV E/e' medial:  29.4 LV IVS:        1.40 cm   LV e' lateral:   4.19 cm/s LVOT diam:     2.10 cm   LV E/e' lateral: 24.6 LV SV:         56 LV SV Index:   31 LVOT Area:     3.46 cm  RIGHT VENTRICLE RV S prime:     9.08 cm/s TAPSE (M-mode): 1.6 cm LEFT ATRIUM            Index        RIGHT ATRIUM           Index LA diam:      3.80 cm  2.12 cm/m   RA Area:     12.20 cm LA Vol (A2C): 54.8 ml  30.56 ml/m  RA Volume:   23.60 ml  13.16 ml/m LA Vol (A4C): 131.0 ml 73.06 ml/m  AORTIC VALVE                     PULMONIC VALVE AV Area (Vmax):    1.07 cm      PR End Diast Vel: 3.30 msec AV Area (Vmean):   1.05 cm AV Area (VTI):     1.21 cm AV Vmax:           234.33 cm/s AV Vmean:          151.667 cm/s AV VTI:  0.466 m AV Peak Grad:      22.0 mmHg AV Mean Grad:      11.0 mmHg LVOT Vmax:         72.30 cm/s LVOT Vmean:        45.900 cm/s LVOT VTI:          0.163 m LVOT/AV VTI ratio: 0.35  AORTA Ao Root diam: 3.00 cm Ao Asc diam:  3.40 cm MITRAL VALVE MV Area (PHT): 2.62 cm     SHUNTS MV Decel Time: 290 msec     Systemic VTI:  0.16 m MV E velocity: 103.00 cm/s  Systemic Diam: 2.10 cm MV A velocity: 131.00 cm/s MV E/A ratio:  0.79 Dina Rich MD Electronically signed by Dina Rich MD Signature Date/Time: 02/04/2023/10:12:48 AM    Final    CT ABDOMEN PELVIS WO CONTRAST  Result Date: 02/03/2023 CLINICAL DATA:  Acute abdominal pain EXAM: CT ABDOMEN AND PELVIS WITHOUT CONTRAST TECHNIQUE: Multidetector CT imaging of the abdomen and pelvis was performed following the standard protocol without IV contrast. RADIATION DOSE REDUCTION: This exam was performed according to the departmental dose-optimization program which includes automated exposure control, adjustment of the mA and/or kV according to patient size and/or use of iterative reconstruction technique. COMPARISON:  03/06/2021 FINDINGS: Lower chest: Small bilateral pleural effusions with associated passive atelectasis. Suspected old  granulomatous disease. 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 Moderate cardiomegaly. Aortic and mitral valve calcifications. Aortic and left anterior descending coronary artery atherosclerotic vascular disease. We partially image a 1.6 by 1.3 cm right lower lobe nodule on image 1 series 9, uncertain whether this represents a nodular airspace opacity or neoplastic nodule. Airway thickening noted with mild airway plugging in the lower lobes. Patchy mosaic attenuation in the right middle lobe is nonspecific but could reflect edema Nodule along the upper medial right breast measures 1.2 cm in diameter on image 17 of series 7 and was previously less solid-appearing and 0.8 cm in diameter on 03/06/2021. This could be an inflammatory lesion although malignancy is not excluded. Hepatobiliary: Cholelithiasis noted including a 0.8 cm gallstone on image 37 series 7. Pancreas: Unremarkable Spleen: Unremarkable Adrenals/Urinary Tract: Stable thickened appearance of the adrenal glands. This is not changed from 03/03/2016 and is considered benign. No further workup of the adrenal glands is currently indicated based solely on imaging appearance. Two Bosniak category 1 cysts of the left kidney have fluid density. No further imaging workup of these lesions is indicated. 2 mm right kidney lower pole nonobstructive renal calculus. 3 mm left kidney lower pole nonobstructive renal calculus. No hydronephrosis or hydroureter. Right posterior bladder diverticulum noted. Stomach/Bowel: Small type 1 hiatal hernia. Proximal gastric wall thickening is probably related to nondistention. Mild prominence of stool in the rectum, cannot exclude mild/early fecal impaction. Distal descending and sigmoid colon diverticulosis without findings of active diverticulitis. The appendix does not appear inflamed. No dilated small bowel. Vascular/Lymphatic:  Atherosclerosis is present, including aortoiliac atherosclerotic disease. 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 No pathologic adenopathy is identified. Reproductive: Calcified uterine fibroids measuring up to 1.9 cm in diameter. Stable adnexal calcifications. No further workup of these lesions is indicated. Other: No supplemental non-categorized findings. Musculoskeletal: Bilateral groin hernias containing adipose tissue with some stranding along the left groin hernia and some postoperative findings along the left common iliac vasculature. Left  proximal femoral IM nail system noted. Bony demineralization.  General regional muscular atrophy. High density material within the superficial umbilicus, possibly calcification or foreign body. Lumbar spondylosis and degenerative disc disease, potential left foraminal impingement at L5-S1 due to spurring. Mild dextroconvex lumbar scoliosis. Chronic superior endplate compression fracture at L3 with 30% loss of vertebral body height (this fracture was shown to be acute on the prior MRI from 09/13/2020). IMPRESSION: 1. Small bilateral pleural effusions with associated passive atelectasis. 2. 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 3. Moderate cardiomegaly. 4. We partially image a 1.6 by 1.3 cm right lower lobe nodule, uncertain whether this represents a nodular airspace opacity or neoplastic nodule. Consider one of the following in 3 months for both low-risk and high-risk individuals: (a) repeat chest CT, (b) follow-up PET-CT, or (c) tissue sampling. This recommendation follows the consensus statement: Guidelines for Management of Incidental Pulmonary Nodules Detected on CT Images: From the Fleischner Society 2017; Radiology 2017; 284:228-243. 5. Nodule along the upper medial right  breast measures 1.2 cm in diameter and was previously less solid-appearing and 0.8 cm in diameter on 03/06/2021. This could be an inflammatory lesion although malignancy is not excluded. If the patient is not up-to-date on mammography then diagnostic mammography would be recommended. 6. Cholelithiasis. 7. Nonobstructive bilateral nephrolithiasis. 8. Small type 1 hiatal hernia. 9. Mild prominence of stool in the rectum, cannot exclude mild/early fecal impaction. 10. Distal descending and sigmoid colon diverticulosis without findings of active diverticulitis. 11. Chronic superior endplate compression fracture at L3 with 30% loss of vertebral body height. 12. Aortic, coronary, and systemic atherosclerosis. Aortic Atherosclerosis (ICD10-I70.0). Electronically Signed   By: Gaylyn Rong M.D.   On: 02/03/2023 15:38   CT Head Wo Contrast  Result Date: 02/03/2023 CLINICAL DATA:  Delirium. EXAM: CT HEAD WITHOUT CONTRAST TECHNIQUE: Contiguous axial images were obtained from the base of the skull through the vertex without intravenous contrast. RADIATION DOSE REDUCTION: This exam was performed according to the departmental dose-optimization program which includes automated exposure control, adjustment of the mA and/or kV according to patient size and/or use of iterative reconstruction technique. COMPARISON:  Head CT 03/06/2021 FINDINGS: Brain: No evidence of acute infarction, hemorrhage, hydrocephalus, extra-axial collection or mass effect. Generalized brain atrophy. 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 Small bilateral cerebellar infarcts more extensive on the left by coronal reformats. 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 Skull: Normal. Negative for fracture or focal lesion. Sinuses/Orbits: No acute finding. IMPRESSION: 1. No acute finding. 2. Brain atrophy with chronic small vessel ischemia  and chronic infarcts. 3. 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 Electronically Signed   By: Tiburcio Pea M.D.   On: 02/03/2023 15:27   DG Chest 1 View  Result Date: 02/03/2023 CLINICAL DATA:  Incisional bleeding. EXAM: CHEST  1 VIEW COMPARISON:  X-ray 03/06/2021. FINDINGS: Enlarged cardiopericardial silhouette with small bilateral pleural effusions. Basilar atelectasis as well. No pneumothorax or edema. Film is rotated to the left. Overlapping cardiac leads. Osteopenia. IMPRESSION: Enlarged heart.  Small pleural effusions. Electronically Signed   By: Karen Kays M.D.   On: 02/03/2023 12:44    Cardiac Studies   02/2023 echo 1. Left ventricular ejection fraction, by estimation, is 65 to 70%. The  left ventricle has normal function. The left ventricle has no regional  wall motion abnormalities. There is  moderate asymmetric left ventricular  hypertrophy of the septal segment.  Left ventricular diastolic parameters are consistent with Grade I  diastolic dysfunction (impaired relaxation). Elevated left atrial  pressure.   2. Right ventricular systolic function is normal. The right ventricular  size is normal. Tricuspid regurgitation signal is inadequate for assessing  PA pressure.   3. Left atrial size was severely dilated.   4. Moderate pericardial effusion. The pericardial effusion is posterior  to the left ventricle. There is no evidence of cardiac tamponade.   5. The mitral valve is abnormal. Trivial mitral valve regurgitation. No  evidence of mitral stenosis. Moderate mitral annular calcification.   6. The aortic valve is tricuspid. There is moderate calcification of the  aortic valve. There is moderate thickening of the aortic valve. Aortic  valve regurgitation is not visualized. Mild to moderate aortic valve  stenosis.   7. The inferior vena cava is normal in size with greater than 50%  respiratory variability, suggesting right atrial pressure of 3 mmHg.     Patient Profile     [name] is a 82 y.o. female with a hx of PAD (prior right popliteal to tibial bypass in 2017, recent percutaneous transluminal angioplasty and stent placement to right SFA, popliteal arteries and anterior tibial artery on 01/28/2023), HFpEF, HTN, HLD, Type 2 DM, aortic stenosis and history of CVA who is being seen 02/04/2023 for the evaluation of elevated troponin values at the request of Dr. Sherryll Burger.   Assessment & Plan    1.Elevated troponin - in setting of UTI, HTN on admission SBP to 180, chronic diastolic HF, anemia with Hgb down to 8.3 was 10.8 1 week ago.  EKG chronic ST/T changes. She is not able to give any history of symptoms due to AMS  - echo LVEF hyperdynamic EGF 65-70% witihout WMAs - historically on chart review has had some degree of chronic elevated troponins.  - suspect demand ischemia. Poor cath candidate in general with anemia and AMS. She is refusing and spitting out her oral pills.  No plans for ischemic testing.    2.AMS/UTI - per primary team   3. Aortic stenosis - mild to moderate by echo, continue to monitor   4.Pericardial effusion - noted on CT, moderate by echo without evidence of tamponade physiology.    5. Anemia - per primary team  6. PAD - recent intervention, followed by vascular  No additional cardiology recs at this time, we will sign off inpatient care and arrange outpatient f/u.      For questions or updates, please contact [facility] Please consult [email or web address] for contact info under        Signed, Dina Rich, MD  02/05/2023, 8:31 AM

## 2023-02-05 NOTE — Progress Notes (Signed)
Date and time results received: 02/05/23   Test: BMP- Potassium  Value: 2.9   Name of Provider Notified: MD Zierle-Ghosh   Orders Received? Or Actions Taken?: No new orders at this time.

## 2023-02-05 NOTE — Progress Notes (Signed)
[facility] PROGRESS NOTE  [name] (DOB: 28-Jan-1941) ZOX:[id number] PCP: Abram Sander, MD  Brief Narrative: [name] is a 82 y.o. female with medical history significant for hypertension, type 2 diabetes, chronic diastolic heart failure, dyslipidemia, COPD, and PAD with claudication with recent right lower extremity angioplasty on 6/25.  She presented to the ED with increasing weakness and refusing to eat over the last 2-3 days.  She has had similar symptoms previously related to urinary tract infection. She was febrile with evidence of UTI by UA, later confirmed to be E. coli. Initially started on meropenem, this was changed to ceftriaxone once susceptibility data was available. Her mental status remains markedly off baseline. Cardiology was consulted for troponin elevation, recommending outpatient follow up, suspecting demand myocardial ischemia.   Subjective: She is verbally responsive, but refused to take pills this morning. Her husband at bedside says she's much more alert but continues to be confused compared to her baseline. Transports by wheelchair, doesn't walk. Her right foot wound is stable per him. She reports some lower abdominal pain.  Objective: BP (!) 127/39 (BP Location: Left Arm)   Pulse 73   Temp (!) 97.5 F (36.4 C) (Axillary)   Resp 14   Ht 5\' 9"  (1.753 m)   Wt 65 kg   SpO2 97%   BMI 21.16 kg/m   Gen: Chronically ill-appearing elderly female in no distress Pulm: Clear, nonlabored  CV: RRR, no MRG or pitting GI: Soft, mild suprapubic tenderness, ND, +BS Neuro: Keeps eyes closed but will open on command and answer questions, recognizes spouse, diffusely weak but that is near baseline. Ext: Warm, dry. 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 There is no tenderness, no exudate, no odor, and no erythema. Adequate cap refill. Skin: No other rashes, lesions or  ulcers on visualized skin   Assessment & Plan: Acute metabolic encephalopathy likely secondary to UTI: Slow steady improvement with Tx of UTI as below. Suspect an element of withdrawn hospital delirium as well, CT head nonacute but did show atrophy and small vessel ischemia, chronic infarcts.  - Delirium precautions.   E. coli UTI:  - Transitioned merrem > CTX based on susceptibility data. Will plan 7 days total Tx if remains hemodynamically stable.    Elevated troponin: Demand myocardial ischemia suspected per cardiology. Pt has no chest pain. No new WMA on echo.  - Outpatient evaluation recommended. 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 LVEF 65-70%, G1DD with moderate LVH, severe LAE, mild-mod AS.  - Continue home torsemide - Continue norvasc, hydralazine, coreg.  Hypokalemia:  - Supplement by IV since pt spitting out pills.    PAD: s/p RLE angioplasty performed 6/25 to help R foot wound heal. - Resume Plavix and Eliquis (did have some bleeding PTA but none currently) - Outpatient follow up with Dr. Gilda Crease recommended.   Right foot wound: Chronic, does not appear infected.  - Continue conservative wound care and wound care clinic follow up.   Anemia of chronic disease: Stable, no bleeding.  - Continue monitoring.   IDT2DM: HbA1c 7.3%.  - Continue SSI    Dyslipidemia: - Continue statin   COPD: no exacerbation - BDs prn  Tyrone Nine, MD [facility] [email or web address] 02/05/2023, 3:00 PM

## 2023-02-05 NOTE — Progress Notes (Signed)
Patient took one bite of nighttime meds then refused the rest. Kept screaming, "More water". Patients morning med Modafinil, patient spit out would not swallow and Hydralazine held due to BP. MD Zierle-Ghosh made aware.

## 2023-02-06 DIAGNOSIS — G9341 Metabolic encephalopathy: Secondary | ICD-10-CM | POA: Diagnosis not present

## 2023-02-06 LAB — BASIC METABOLIC PANEL
Anion gap: 6 (ref 5–15)
BUN: 11 mg/dL (ref 8–23)
CO2: 25 mmol/L (ref 22–32)
Calcium: 8.4 mg/dL — ABNORMAL LOW (ref 8.9–10.3)
Chloride: 104 mmol/L (ref 98–111)
Creatinine, Ser: 0.53 mg/dL (ref 0.44–1.00)
GFR, Estimated: >60 mL/min (ref >60)
Glucose, Bld: 161 mg/dL — ABNORMAL HIGH (ref 70–99)
Potassium: 3.8 mmol/L (ref 3.5–5.1)
Sodium: 135 mmol/L (ref 135–145)

## 2023-02-06 LAB — GLUCOSE, CAPILLARY
Glucose-Capillary: 159 mg/dL — ABNORMAL HIGH (ref 70–99)
Glucose-Capillary: 217 mg/dL — ABNORMAL HIGH (ref 70–99)
Glucose-Capillary: 186 mg/dL — ABNORMAL HIGH (ref 70–99)
Glucose-Capillary: 189 mg/dL — ABNORMAL HIGH (ref 70–99)

## 2023-02-06 MED ORDER — CEFDINIR 300 MG PO CAPS: 300.0000 mg | ORAL_CAPSULE | Freq: Two times a day (BID) | ORAL | 0 refills | Status: AC

## 2023-02-06 MED ORDER — DICLOFENAC SODIUM 1 % EX GEL: 2.0000 g | Freq: Four times a day (QID) | CUTANEOUS | Status: DC | PRN

## 2023-02-06 MED ORDER — CEFTRIAXONE SODIUM 1 G IJ SOLR
1.0000 g | Freq: Once | INTRAMUSCULAR | Status: AC
  Administered 2023-02-06: 1 g via INTRAMUSCULAR
  Filled 2023-02-06: qty 10

## 2023-02-06 NOTE — Discharge Summary (Signed)
Physician Discharge Summary   Patient: [name] MRN: [id number] DOB: 09/03/1940  Admit date:     02/03/2023  Discharge date: 02/06/23  Discharge Physician: Tyrone Nine   PCP: Abram Sander, MD   Recommendations at discharge:  Follow up with wound care center for right foot wound which appears uninfected at this time. Follow up with vascular surgery, Dr. Gilda Crease with follow up ABI as scheduled 7/15. Follow up with cardiology as scheduled 7/16 for consideration of further evaluation of demand myocardial ischemia noted in setting of UTI during this admission.  Discharge Diagnoses: Principal Problem:   Acute metabolic encephalopathy Active Problems:   HTN (hypertension)   HLD (hyperlipidemia)   Anemia, chronic disease   Chronic diastolic CHF (congestive heart failure) (HCC)   Diabetic neuropathy (HCC)   COPD (chronic obstructive pulmonary disease) (HCC)   GERD (gastroesophageal reflux disease)   Elevated troponin   Atherosclerosis of native arteries of extremity with rest pain [facility])  Hospital Course: [name] is a 82 y.o. female with medical history significant for hypertension, type 2 diabetes, chronic diastolic heart failure, dyslipidemia, COPD, and PAD with claudication with recent right lower extremity angioplasty on 6/25.  She presented to the ED with increasing weakness and refusing to eat over the last 2-3 days.  She has had similar symptoms previously related to urinary tract infection. She was febrile with evidence of UTI by UA, later confirmed to be E. coli. Initially started on meropenem, this was changed to ceftriaxone once susceptibility data were available. Cardiology was consulted for troponin elevation, recommending outpatient follow up, suspecting demand myocardial ischemia.   Mental status has improved, she remains afebrile, and tolerating oral medications.   Assessment and Plan: Acute metabolic encephalopathy likely secondary to UTI: Slow steady improvement  with Tx of UTI as below. Suspect an element of withdrawn hospital delirium as well, CT head nonacute but did show atrophy and small vessel ischemia, chronic infarcts.    E. coli UTI:  - Transitioned merrem > CTX based on susceptibility data. Will plan 7 days total Tx with omnicef based on culture sensitivities, prior tolerance of this antibiotic, and ability to give in apple sauce.   Elevated troponin: Demand myocardial ischemia suspected per cardiology. Pt has no chest pain. No new WMA on echo.  - Outpatient evaluation recommended. 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 LVEF 65-70%, G1DD with moderate LVH, severe LAE, mild-mod AS.  - Continue home torsemide - Continue norvasc, hydralazine, coreg.   Hypokalemia: Resolved with supplementation   PAD: s/p RLE angioplasty performed 6/25 to help R foot wound heal. - Resume Plavix and Eliquis (did have some bleeding PTA but none currently) - Outpatient follow up with Dr. Gilda Crease   Right foot wound: Chronic, does not appear infected.  - Continue conservative wound care and wound care clinic follow up.   Anemia of chronic disease: Stable, no bleeding.  - Continue monitoring.   IDT2DM: HbA1c 7.3%.  - Continue home Tx   Dyslipidemia: - Continue statin   COPD: no exacerbation - BDs prn  Consultants: Cardiology, Dr. Wyline Mood Procedures performed: None  Disposition: Home Diet recommendation:  Cardiac and Carb modified diet DISCHARGE MEDICATION: Allergies as of 02/06/2023       Reactions   Egg-derived Products Shortness Of Breath   Iodine Anaphylaxis   Penicillins Anaphylaxis   Tolerates ceftriaxone, cefazolin Did it involve swelling of the face/tongue/throat, SOB, or low  BP? Unknown Did it involve sudden or severe rash/hives, skin peeling, or any reaction on the inside of your mouth or nose? Unknown Did you need to seek medical  attention at a hospital or doctor's office? Unknown When did it last happen?      Unknown If all above answers are "NO", may proceed with cephalosporin use.   Shellfish Allergy Anaphylaxis   Sulfa Antibiotics Anaphylaxis   Sulfacetamide Sodium Anaphylaxis   Sulfasalazine Anaphylaxis   Morphine And Codeine Other (See Comments)   Altered mental status        Medication List     STOP taking these medications    doxycycline 100 MG tablet Commonly known as: VIBRA-TABS       TAKE these medications    acetaminophen 500 MG tablet Commonly known as: TYLENOL Take 1,000 mg by mouth every 6 (six) hours as needed for mild pain.   amLODipine 10 MG tablet Commonly known as: NORVASC Take 10 mg by mouth daily.   apixaban 2.5 MG Tabs tablet Commonly known as: Eliquis Take 1 tablet (2.5 mg total) by mouth 2 (two) times daily.   aspirin EC 81 MG tablet Take 1 tablet (81 mg total) by mouth daily.   atorvastatin 20 MG tablet Commonly known as: LIPITOR Take 1 tablet (20 mg total) by mouth daily. What changed: when to take this   budesonide 0.25 MG/2ML nebulizer solution Commonly known as: PULMICORT Take 2 mLs (0.25 mg total) by nebulization 2 (two) times daily.   carbamide peroxide 6.5 % OTIC solution Commonly known as: DEBROX Place 5 drops into both ears 2 (two) times daily as needed (ear wax).   carvedilol 6.25 MG tablet Commonly known as: COREG Take 6.25 mg by mouth 2 (two) times daily with a meal.   cefdinir 300 MG capsule Commonly known as: OMNICEF Take 1 capsule (300 mg total) by mouth 2 (two) times daily for 3 days. Start taking on: February 07, 2023   cetirizine 10 MG tablet Commonly known as: ZYRTEC Take 10 mg by mouth daily.   cholecalciferol 25 MCG (1000 UNIT) tablet Commonly known as: VITAMIN D3 Take 1,000 Units by mouth daily.   clopidogrel 75 MG tablet Commonly known as: PLAVIX Take 1 tablet (75 mg total) by mouth daily with breakfast.   diclofenac Sodium  1 % Gel Commonly known as: VOLTAREN Apply 1 application topically 4 (four) times daily as needed (pain).   docusate sodium 100 MG capsule Commonly known as: COLACE Take 100 mg by mouth daily.   fluticasone 50 MCG/ACT nasal spray Commonly known as: FLONASE Place 1 spray into both nostrils daily as needed for allergies.   gabapentin 300 MG capsule Commonly known as: NEURONTIN Take 300 mg by mouth at bedtime.   hydrALAZINE 25 MG tablet Commonly known as: APRESOLINE Take 1 tablet (25 mg total) by mouth every 8 (eight) hours.   insulin glargine 100 UNIT/ML injection Commonly known as: LANTUS Inject 0.1 mLs (10 Units total) into the skin at bedtime. What changed: how much to take   LUBRICATING EYE DROPS OP Place 1 drop into both eyes daily as needed (dry eyes).   magnesium hydroxide 400 MG/5ML suspension Commonly known as: MILK OF MAGNESIA Take 15 mLs by mouth daily as needed for mild constipation.   magnesium oxide 400 MG tablet Commonly known as: MAG-OX Take 400 mg by mouth 2 (two) times daily.   modafinil 200 MG tablet Commonly known as: PROVIGIL Take 200 mg by mouth daily.  Nyamyc powder Generic drug: nystatin Apply 1 application topically daily.   omeprazole 20 MG capsule Commonly known as: PRILOSEC Take 1 capsule (20 mg total) by mouth daily.   polyethylene glycol 17 g packet Commonly known as: MIRALAX / GLYCOLAX Take 17 g by mouth 2 (two) times daily. What changed:  when to take this reasons to take this   potassium chloride SA 20 MEQ tablet Commonly known as: KLOR-CON M Take 20 mEq by mouth daily.   torsemide 20 MG tablet Commonly known as: DEMADEX Take 1 tablet (20 mg total) by mouth 2 (two) times daily.   Vitamin E 180 MG Caps Take 180 mg by mouth daily.        Follow-up Information     Dyann Kief, PA-C Follow up on 02/18/2023.   Specialty: Cardiology Why: Cardiology Hospital Follow-up on 02/18/2023 at 1:00 PM. If needing to switch to  a telehealth visit or needing a different date/time, please contact the office. Contact information: [address] [place] [address] [phone]         [facility] Follow up.   Specialty: Home Health Services Why: Will contact you to schedule home health visits. Contact information: [address] [place] [address] [phone]         Abram Sander, MD. Schedule an appointment as soon as possible for a visit.   Specialty: Family Medicine Contact information: [address] [place] [address] [phone]                Discharge Exam: Ceasar Mons Weights   02/03/23 1135  Weight: 65 kg  BP (!) 132/58   Pulse 68   Temp 98.3 F (36.8 C) (Oral)   Resp 19   Ht 5\' 9"  (1.753 m)   Wt 65 kg   SpO2 96%   BMI 21.16 kg/m   Elderly frail female is responsive and interactive in no distress Clear, nonlabored Regular with II/VI systolic murmur at the base, no JVD, no pitting dependent edema 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 There is no tenderness, no exudate, no odor, and no erythema. Adequate cap refill.   Condition at discharge: stable  The results of significant diagnostics from this hospitalization (including imaging, microbiology, ancillary and laboratory) are listed below for reference.   Imaging Studies: ECHOCARDIOGRAM COMPLETE  Result Date: 02/04/2023    ECHOCARDIOGRAM REPORT   Patient Name:   [name] Date of Exam: 02/04/2023 Medical Rec #:  [id number]     Height:       69.0 in Accession #:    [id number]    Weight:       143.3 lb Date of Birth:  11/15/1940      BSA:          1.793 m Patient Age:    82 years      BP:           175/63 mmHg Patient Gender: F             HR:           70 bpm. [name] Procedure: 2D Echo, Cardiac Doppler and Color Doppler Indications:    Abnormal ECG R94.31  History:         Patient has prior history of Echocardiogram examinations, most  recent 03/08/2021. CHF, Abnormal ECG, COPD, PAD and Stroke, Aortic                 Valve Disease, Signs/Symptoms:Altered Mental Status; Risk                 Factors:Hypertension, Dyslipidemia and Diabetes.  Sonographer:    Aron Baba Referring Phys: [id number] PRATIK D [facility]  Sonographer Comments: Image acquisition challenging due to respiratory motion. IMPRESSIONS  1. Left ventricular ejection fraction, by estimation, is 65 to 70%. The left ventricle has normal function. The left ventricle has no regional wall motion abnormalities. There is moderate asymmetric left ventricular hypertrophy of the septal segment. Left ventricular diastolic parameters are consistent with Grade I diastolic dysfunction (impaired relaxation). Elevated left atrial pressure.  2. Right ventricular systolic function is normal. The right ventricular size is normal. Tricuspid regurgitation signal is inadequate for assessing PA pressure.  3. Left atrial size was severely dilated.  4. Moderate pericardial effusion. The pericardial effusion is posterior to the left ventricle. There is no evidence of cardiac tamponade.  5. The mitral valve is abnormal. Trivial mitral valve regurgitation. No evidence of mitral stenosis. Moderate mitral annular calcification.  6. The aortic valve is tricuspid. There is moderate calcification of the aortic valve. There is moderate thickening of the aortic valve. Aortic valve regurgitation is not visualized. Mild to moderate aortic valve stenosis.  7. The inferior vena cava is normal in size with greater than 50% respiratory variability, suggesting right atrial pressure of 3 mmHg. FINDINGS  Left Ventricle: Left ventricular ejection fraction, by estimation, is 65 to 70%. The left ventricle has normal function. The left ventricle has no regional wall motion abnormalities. The left ventricular internal cavity size was normal in size. There is   moderate asymmetric left ventricular hypertrophy of the septal segment. Left ventricular diastolic parameters are consistent with Grade I diastolic dysfunction (impaired relaxation). Elevated left atrial pressure. Right Ventricle: The right ventricular size is normal. Right vetricular wall thickness was not well visualized. Right ventricular systolic function is normal. Tricuspid regurgitation signal is inadequate for assessing PA pressure. Left Atrium: Left atrial size was severely dilated. Right Atrium: Right atrial size was normal in size. Pericardium: A moderately sized pericardial effusion is present. The pericardial effusion is posterior to the left ventricle. There is no evidence of cardiac tamponade. Mitral Valve: The mitral valve is abnormal. There is mild thickening of the mitral valve leaflet(s). There is mild calcification of the mitral valve leaflet(s). Moderate mitral annular calcification. Trivial mitral valve regurgitation. No evidence of mitral valve stenosis. Tricuspid Valve: The tricuspid valve is normal in structure. Tricuspid valve regurgitation is not demonstrated. No evidence of tricuspid stenosis. Aortic Valve: The aortic valve is tricuspid. There is moderate calcification of the aortic valve. There is moderate thickening of the aortic valve. There is moderate aortic valve annular calcification. Aortic valve regurgitation is not visualized. Mild to moderate aortic stenosis is present. Aortic valve mean gradient measures 11.0 mmHg. Aortic valve peak gradient measures 22.0 mmHg. Aortic valve area, by VTI measures 1.21 cm. Pulmonic Valve: The pulmonic valve was not well visualized. Pulmonic valve regurgitation is mild. No evidence of pulmonic stenosis. Aorta: The aortic root is normal in size and structure. Venous: The inferior vena cava is normal in size with greater than 50% respiratory variability, suggesting right atrial pressure of 3 mmHg. IAS/Shunts: No atrial level shunt detected by  color flow Doppler.  LEFT VENTRICLE PLAX 2D LVIDd:  3.40 cm   Diastology LVIDs:         2.10 cm   LV e' medial:    3.50 cm/s LV PW:         1.10 cm   LV E/e' medial:  29.4 LV IVS:        1.40 cm   LV e' lateral:   4.19 cm/s LVOT diam:     2.10 cm   LV E/e' lateral: 24.6 LV SV:         56 LV SV Index:   31 LVOT Area:     3.46 cm  RIGHT VENTRICLE RV S prime:     9.08 cm/s TAPSE (M-mode): 1.6 cm LEFT ATRIUM            Index        RIGHT ATRIUM           Index LA diam:      3.80 cm  2.12 cm/m   RA Area:     12.20 cm LA Vol (A2C): 54.8 ml  30.56 ml/m  RA Volume:   23.60 ml  13.16 ml/m LA Vol (A4C): 131.0 ml 73.06 ml/m  AORTIC VALVE                     PULMONIC VALVE AV Area (Vmax):    1.07 cm      PR End Diast Vel: 3.30 msec AV Area (Vmean):   1.05 cm AV Area (VTI):     1.21 cm AV Vmax:           234.33 cm/s AV Vmean:          151.667 cm/s AV VTI:            0.466 m AV Peak Grad:      22.0 mmHg AV Mean Grad:      11.0 mmHg LVOT Vmax:         72.30 cm/s LVOT Vmean:        45.900 cm/s LVOT VTI:          0.163 m LVOT/AV VTI ratio: 0.35  AORTA Ao Root diam: 3.00 cm Ao Asc diam:  3.40 cm MITRAL VALVE MV Area (PHT): 2.62 cm     SHUNTS MV Decel Time: 290 msec     Systemic VTI:  0.16 m MV E velocity: 103.00 cm/s  Systemic Diam: 2.10 cm MV A velocity: 131.00 cm/s MV E/A ratio:  0.79 Dina Rich MD Electronically signed by Dina Rich MD Signature Date/Time: 02/04/2023/10:12:48 AM    Final    CT ABDOMEN PELVIS WO CONTRAST  Result Date: 02/03/2023 CLINICAL DATA:  Acute abdominal pain EXAM: CT ABDOMEN AND PELVIS WITHOUT CONTRAST TECHNIQUE: Multidetector CT imaging of the abdomen and pelvis was performed following the standard protocol without IV contrast. RADIATION DOSE REDUCTION: This exam was performed according to the departmental dose-optimization program which includes automated exposure control, adjustment of the mA and/or kV according to patient size and/or use of iterative reconstruction technique.  COMPARISON:  03/06/2021 FINDINGS: Lower chest: Small bilateral pleural effusions with associated passive atelectasis. Suspected old granulomatous disease. 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 Moderate cardiomegaly. Aortic and mitral valve calcifications. Aortic and left anterior descending coronary artery atherosclerotic vascular disease. We partially image a 1.6 by 1.3 cm right lower lobe nodule on image 1 series 9, uncertain whether this represents a nodular airspace opacity or neoplastic nodule. Airway  thickening noted with mild airway plugging in the lower lobes. Patchy mosaic attenuation in the right middle lobe is nonspecific but could reflect edema Nodule along the upper medial right breast measures 1.2 cm in diameter on image 17 of series 7 and was previously less solid-appearing and 0.8 cm in diameter on 03/06/2021. This could be an inflammatory lesion although malignancy is not excluded. Hepatobiliary: Cholelithiasis noted including a 0.8 cm gallstone on image 37 series 7. Pancreas: Unremarkable Spleen: Unremarkable Adrenals/Urinary Tract: Stable thickened appearance of the adrenal glands. This is not changed from 03/03/2016 and is considered benign. No further workup of the adrenal glands is currently indicated based solely on imaging appearance. Two Bosniak category 1 cysts of the left kidney have fluid density. No further imaging workup of these lesions is indicated. 2 mm right kidney lower pole nonobstructive renal calculus. 3 mm left kidney lower pole nonobstructive renal calculus. No hydronephrosis or hydroureter. Right posterior bladder diverticulum noted. Stomach/Bowel: Small type 1 hiatal hernia. Proximal gastric wall thickening is probably related to nondistention. Mild prominence of stool in the rectum, cannot exclude mild/early fecal impaction. Distal descending and sigmoid colon  diverticulosis without findings of active diverticulitis. The appendix does not appear inflamed. No dilated small bowel. Vascular/Lymphatic: Atherosclerosis is present, including aortoiliac atherosclerotic disease. 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 No pathologic adenopathy is identified. Reproductive: Calcified uterine fibroids measuring up to 1.9 cm in diameter. Stable adnexal calcifications. No further workup of these lesions is indicated. Other: No supplemental non-categorized findings. Musculoskeletal: Bilateral groin hernias containing adipose tissue with some stranding along the left groin hernia and some postoperative findings along the left common iliac vasculature. Left proximal femoral IM nail system noted. Bony demineralization.  General regional muscular atrophy. High density material within the superficial umbilicus, possibly calcification or foreign body. Lumbar spondylosis and degenerative disc disease, potential left foraminal impingement at L5-S1 due to spurring. Mild dextroconvex lumbar scoliosis. Chronic superior endplate compression fracture at L3 with 30% loss of vertebral body height (this fracture was shown to be acute on the prior MRI from 09/13/2020). IMPRESSION: 1. Small bilateral pleural effusions with associated passive atelectasis. 2. 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 3. Moderate cardiomegaly. 4. We partially image a 1.6 by 1.3 cm right lower lobe nodule, uncertain whether this represents a nodular airspace opacity or neoplastic nodule. Consider one of the following in 3 months for both low-risk and high-risk individuals: (a) repeat chest CT, (b) follow-up PET-CT, or (c) tissue sampling. This recommendation follows the consensus statement: Guidelines for Management of Incidental  Pulmonary Nodules Detected on CT Images: From the Fleischner Society 2017; Radiology 2017; 284:228-243. 5. Nodule along the upper medial right breast measures 1.2 cm in diameter and was previously less solid-appearing and 0.8 cm in diameter on 03/06/2021. This could be an inflammatory lesion although malignancy is not excluded. If the patient is not up-to-date on mammography then diagnostic mammography would be recommended. 6. Cholelithiasis. 7. Nonobstructive bilateral nephrolithiasis. 8. Small type 1 hiatal hernia. 9. Mild prominence of stool in the rectum, cannot exclude mild/early fecal impaction. 10. Distal descending and sigmoid colon diverticulosis without findings of active diverticulitis. 11. Chronic superior endplate compression fracture at L3 with 30% loss of vertebral body height. 12. Aortic, coronary, and systemic atherosclerosis. Aortic Atherosclerosis (ICD10-I70.0). Electronically Signed   By: Annitta Needs.D.  On: 02/03/2023 15:38   CT Head Wo Contrast  Result Date: 02/03/2023 CLINICAL DATA:  Delirium. EXAM: CT HEAD WITHOUT CONTRAST TECHNIQUE: Contiguous axial images were obtained from the base of the skull through the vertex without intravenous contrast. RADIATION DOSE REDUCTION: This exam was performed according to the departmental dose-optimization program which includes automated exposure control, adjustment of the mA and/or kV according to patient size and/or use of iterative reconstruction technique. COMPARISON:  Head CT 03/06/2021 FINDINGS: Brain: No evidence of acute infarction, hemorrhage, hydrocephalus, extra-axial collection or mass effect. Generalized brain atrophy. 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 Small bilateral cerebellar infarcts more extensive on the left by coronal reformats. 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 Skull: Normal. Negative for  fracture or focal lesion. Sinuses/Orbits: No acute finding. IMPRESSION: 1. No acute finding. 2. 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 3. 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 Electronically Signed   By: Tiburcio Pea M.D.   On: 02/03/2023 15:27   DG Chest 1 View  Result Date: 02/03/2023 CLINICAL DATA:  Incisional bleeding. EXAM: CHEST  1 VIEW COMPARISON:  X-ray 03/06/2021. FINDINGS: Enlarged cardiopericardial silhouette with small bilateral pleural effusions. Basilar atelectasis as well. No pneumothorax or edema. Film is rotated to the left. Overlapping cardiac leads. Osteopenia. IMPRESSION: Enlarged heart.  Small pleural effusions. Electronically Signed   By: Karen Kays M.D.   On: 02/03/2023 12:44   PERIPHERAL VASCULAR CATHETERIZATION  Result Date: 01/28/2023 See surgical note for result.   Microbiology: Results for orders placed or performed during the hospital encounter of 02/03/23  Urine Culture     Status: Abnormal   Collection Time: 02/03/23  1:15 PM   Specimen: Urine, Clean Catch  Result Value Ref Range Status   Specimen Description   Final    URINE, CLEAN CATCH Performed at [facility], [address], [place], [place] [address]    Special Requests   Final    NONE Performed at [facility], [address] [place], [place] [address]    Culture >=100,000 COLONIES/mL ESCHERICHIA COLI (A)  Final   Report Status 02/05/2023 FINAL  Final   Organism ID, Bacteria ESCHERICHIA COLI (A)  Final      Susceptibility   Escherichia coli - MIC*    AMPICILLIN >=32 RESISTANT Resistant     CEFAZOLIN 16 SENSITIVE Sensitive     CEFEPIME <=0.12 SENSITIVE Sensitive     CEFTRIAXONE <=0.25 SENSITIVE Sensitive     CIPROFLOXACIN >=4 RESISTANT Resistant     GENTAMICIN <=1 SENSITIVE Sensitive     IMIPENEM <=0.25 SENSITIVE Sensitive     NITROFURANTOIN 32 SENSITIVE Sensitive     TRIMETH/SULFA >=320 RESISTANT Resistant     AMPICILLIN/SULBACTAM  16 INTERMEDIATE Intermediate     PIP/TAZO 8 SENSITIVE Sensitive     * >=100,000 COLONIES/mL ESCHERICHIA COLI    Labs: CBC: Recent Labs  Lab 02/03/23 1233 02/04/23 0430 02/05/23 0402  WBC 12.0* 10.6* 12.7*  NEUTROABS 9.2*  --   --   HGB 8.8* 8.3* 8.7*  HCT 30.2* 27.1* 28.4*  MCV 86.0 83.4 83.0  PLT 276 285 388   Basic Metabolic Panel: Recent Labs  Lab 02/03/23 1233 02/04/23 0430 02/05/23 0402 02/06/23 0719  NA 141 146* 143 135  K 4.1 3.5 2.9* 3.8  CL 107 112* 110 104  CO2 25 25 24 25   GLUCOSE 200* 112* 168* 161*  BUN 15 15 13 11   CREATININE 0.57 0.39* 0.49 0.53  CALCIUM 9.1 9.0 8.8* 8.4*  MG 2.5* 2.4 2.3  --    Liver Function Tests: Recent Labs  Lab 02/03/23 1233  AST 17  ALT 19  ALKPHOS 58  BILITOT 1.1  PROT 6.5  ALBUMIN 2.7*   CBG: Recent Labs  Lab 02/05/23 0800 02/05/23 1125 02/05/23 1642 02/05/23 2010 02/06/23 0712  GLUCAP 189* 203* 172* 169* 159*    Discharge time spent: greater than 30 minutes.  Signed: Tyrone Nine, MD [facility] 02/06/2023

## 2023-02-06 NOTE — TOC Transition Note (Signed)
Transition of Care [facility]) - CM/SW Discharge Note   Patient Details  Name: [name] MRN: [id number] Date of Birth: 04-29-1941  [facility]) CM/SW Contact:  Elliot Gault, LCSW Phone Number: 02/06/2023, 11:25 AM   Clinical Narrative:     Pt medically stable for dc today per MD. Plan remains for dc home with family support and [facility].   Updated Cindie at [facility] of pt's dc.  EMS form printed to the floor. RN updated.  No other TOC needs for dc.  Final next level of care: Home w Home Health Services Barriers to Discharge: Barriers Resolved   Patient Goals and CMS Choice CMS Medicare.gov Compare Post Acute Care list provided to:: Patient Represenative (must comment) Choice offered to / list presented to : Spouse  Discharge Placement                         Discharge Plan and Services Additional resources added to the After Visit Summary for   In-house Referral: Clinical Social Work   Post Acute Care Choice: Home Health                    HH Arranged: RN [facility] Agency: [facility] Date [facility] Agency Contacted: 02/05/23 Time HH Agency Contacted: [phone] Representative spoke with at [facility] Agency: Cindie  Social Determinants of Health (SDOH) Interventions SDOH Screenings   Food Insecurity: No Food Insecurity (02/03/2023)  Housing: Low Risk  (02/03/2023)  Transportation Needs: No Transportation Needs (02/03/2023)  Utilities: Not At Risk (02/03/2023)  Tobacco Use: Low Risk  (02/03/2023)     Readmission Risk Interventions    02/04/2023   12:07 PM  Readmission Risk Prevention Plan  Transportation Screening Complete  Home Care Screening Complete  Medication Review (RN CM) Complete

## 2023-02-06 NOTE — Progress Notes (Signed)
Patient resting comfortably with husband at bedside, still confused. Patient has not ate more than a few bites this shift, husband did get her to finish a glucerna. She did take medications in apple sauce without trouble today. Patient has been discharged and awaiting EMS transport.

## 2023-02-06 NOTE — Care Management Important Message (Signed)
Important Message  Patient Details  Name: [name] MRN: [id number] Date of Birth: January 31, 1941   Medicare Important Message Given:  Yes     Corey Harold 02/06/2023, 11:19 AM

## 2023-02-07 LAB — GLUCOSE, CAPILLARY: Glucose-Capillary: 177 mg/dL — ABNORMAL HIGH (ref 70–99)

## 2023-02-07 NOTE — Progress Notes (Signed)
Patient slept through this shift no complaints of pain. Patient medication refusal to take mornign hydralazine, Dr. Thomes Dinning notified, no new orders. Patient did take night time medications at the beginning of the shift. Continued to monitor patient.

## 2023-02-07 NOTE — TOC Progression Note (Signed)
Transition of Care [facility]) - Progression Note    Patient Details  Name: INDYAH GILIBERTO MRN: [id number] Date of Birth: [birth year]  Transition of Care [facility]) CM/SW Contact  Karn Cassis, [place] Phone Number: 02/07/2023, 7:07 AM  Clinical Narrative: LCSW confirmed pt is still on EMS list. EMS has convalescent truck this morning and pt is 2nd on list.       Expected Discharge Plan: Home w Home Health Services Barriers to Discharge: Barriers Resolved  Expected Discharge Plan and Services In-house Referral: Clinical Social Work   Post Acute Care Choice: Home Health Living arrangements for the past 2 months: Single Family Home Expected Discharge Date: 02/06/23                         HH Arranged: RN HH Agency: [facility] Date [facility] Agency Contacted: 02/05/23 Time HH Agency Contacted: [phone] Representative spoke with at [facility] Agency: Cindie   Social Determinants of Health (SDOH) Interventions SDOH Screenings   Food Insecurity: No Food Insecurity (02/03/2023)  Housing: Low Risk  (02/03/2023)  Transportation Needs: No Transportation Needs (02/03/2023)  Utilities: Not At Risk (02/03/2023)  Tobacco Use: Low Risk  (02/03/2023)    Readmission Risk Interventions    02/04/2023   12:07 PM  Readmission Risk Prevention Plan  Transportation Screening Complete  Home Care Screening Complete  Medication Review (RN CM) Complete

## 2023-02-10 ENCOUNTER — Other Ambulatory Visit (INDEPENDENT_AMBULATORY_CARE_PROVIDER_SITE_OTHER): Payer: Self-pay | Admitting: Vascular Surgery

## 2023-02-10 DIAGNOSIS — Z9889 Other specified postprocedural states: Secondary | ICD-10-CM

## 2023-02-10 NOTE — Progress Notes (Deleted)
Cardiology Office Note:  .   Date:  02/10/2023  ID:  [name], DOB [birth year], MRN [id number] PCP: Abram Sander, MD  [facility] Providers Cardiologist:  None { Click to update primary MD,subspecialty MD or APP then REFRESH:1}   History of Present Illness: .   [name] is a 82 y.o. female with a hx of PAD (prior right popliteal to tibial bypass in 2017, recent percutaneous transluminal angioplasty and stent placement to right SFA, popliteal arteries and anterior tibial artery on 01/28/2023), HFpEF, HTN, HLD, Type 2 DM, aortic stenosis and history of CVA  Patient was seen 02/04/2023 in the hospital for elevated troponins in the setting of UTI, HTN up to 180, anemia Hbg 8.1. echo EF 65-70% no WMA, suspect demand ischemia. Poor cath candidate with anemia and AMS. No plans for ischemic w/u.  ROS: ***  Studies Reviewed: [name]         Prior CV Studies: {Select studies to display:26339}   02/2023 echo 1. Left ventricular ejection fraction, by estimation, is 65 to 70%. The  left ventricle has normal function. The left ventricle has no regional  wall motion abnormalities. There is moderate asymmetric left ventricular  hypertrophy of the septal segment.  Left ventricular diastolic parameters are consistent with Grade I  diastolic dysfunction (impaired relaxation). Elevated left atrial  pressure.   2. Right ventricular systolic function is normal. The right ventricular  size is normal. Tricuspid regurgitation signal is inadequate for assessing  PA pressure.   3. Left atrial size was severely dilated.   4. Moderate pericardial effusion. The pericardial effusion is posterior  to the left ventricle. There is no evidence of cardiac tamponade.   5. The mitral valve is abnormal. Trivial mitral valve regurgitation. No  evidence of mitral stenosis. Moderate mitral annular calcification.   6. The aortic valve is tricuspid. There is moderate calcification of the  aortic valve. There is  moderate thickening of the aortic valve. Aortic  valve regurgitation is not visualized. Mild to moderate aortic valve  stenosis.   7. The inferior vena cava is normal in size with greater than 50%  respiratory variability, suggesting right atrial pressure of 3 mmHg.     Risk Assessment/Calculations:   {Does this patient have ATRIAL FIBRILLATION?[phone]} No BP recorded.  {Refresh Note OR Click here to enter BP  :1}***       Physical Exam:   VS:  There were no vitals taken for this visit.   Wt Readings from Last 3 Encounters:  02/03/23 143 lb 4.8 oz (65 kg)  01/28/23 144 lb (65.3 kg)  01/01/23 145 lb (65.8 kg)    GEN: Well nourished, well developed in no acute distress NECK: No JVD; No carotid bruits CARDIAC: ***RRR, no murmurs, rubs, gallops RESPIRATORY:  Clear to auscultation without rales, wheezing or rhonchi  ABDOMEN: Soft, non-tender, non-distended EXTREMITIES:  No edema; No deformity   ASSESSMENT AND PLAN: .     Marland KitchenElevated troponin - in setting of UTI, HTN on admission SBP to 180, chronic diastolic HF, anemia with Hgb down to 8.3 was 10.8 1 week ago.  EKG chronic ST/T changes. She is not able to give any history of symptoms due to AMS  - echo LVEF hyperdynamic EGF 65-70% witihout WMAs - historically on chart review has had some degree of chronic elevated troponins.  - suspect demand ischemia. Poor cath candidate in general with anemia and AMS. She is refusing and spitting out her oral pills.  No plans for ischemic testing.     Aortic stenosis - mild to moderate by echo, continue to monitor   Pericardial effusion - noted on CT, moderate by echo without evidence of tamponade physiology.    PAD - recent intervention, followed by vascular      {Are you ordering a CV Procedure (e.g. stress test, cath, DCCV, TEE, etc)?   Press F2        :[id number]}  Dispo: ***  Signed, Jacolyn Reedy, PA-C

## 2023-02-15 NOTE — Progress Notes (Signed)
MRN : [id number]  [name] is a 82 y.o. (April 01, 1941) female who presents with chief complaint of check circulation.  History of Present Illness:   The patient returns to the office for followup and review status post angiogram with intervention on 01/28/2023.   Procedure:  Percutaneous transluminal angioplasty and stent placement right superficial femoral and popliteal arteries to 6 mm 2.  Percutaneous transluminal angioplasty and stent placement right anterior tibial  The patient notes improvement in the lower extremity symptoms. No interval shortening of the patient's claudication distance or rest pain symptoms. No new ulcers or wounds have occurred since the last visit.  There have been no significant changes to the patient's overall health care.  No documented history of amaurosis fugax or recent TIA symptoms. There are no recent neurological changes noted. No documented history of DVT, PE or superficial thrombophlebitis. The patient denies recent episodes of angina or shortness of breath.   ABI's Rt=1.52 and Lt=1.13  (previous ABI's Rt=0.85 and Lt=1.09)  No outpatient medications have been marked as taking for the 02/17/23 encounter (Appointment) with Gilda Crease, Latina Craver, MD.    Past Medical History:  Diagnosis Date   Acute heart failure (HCC)    Aortic stenosis    CHF (congestive heart failure) (HCC)    Complication of anesthesia    Hard to wake patient up after having anesthesia   Diabetes mellitus without complication (HCC)    Diabetic neuropathy (HCC)    Takes Lyrica   Edema of both legs    Takes Lasix   GERD (gastroesophageal reflux disease)    High cholesterol    HTN (hypertension)    Hypertension    PAD (peripheral artery disease) (HCC)    Shortness of breath dyspnea    Spasm of back muscles    Stroke (HCC)    Wound, open    Right great toe    Past Surgical History:  Procedure  Laterality Date   AMPUTATION TOE Left 06/09/2019   Procedure: Left third toe and partial great toe amputation and debridement;  Surgeon: Renford Dills, MD;  Location: [facility];  Service: Vascular;  Laterality: Left;   AMPUTATION TOE Left 04/06/2020   Procedure: AMPUTATION LEFT SECOND TOE;  Surgeon: Gwyneth Revels, DPM;  Location: [facility];  Service: Podiatry;  Laterality: Left;   BYPASS GRAFT POPLITEAL TO TIBIAL Right 02/28/2016   Procedure: BYPASS GRAFT RIGHT BELOW KNEE POPLITEAL TO PERONEAL USING REVERSED RIGHT GREATER SAPHENOUS VEIN;  Surgeon: Fransisco Hertz, MD;  Location: [facility];  Service: Vascular;  Laterality: Right;   CYST EXCISION     Abdomen   EYE SURGERY Bilateral    Cataract removal   INTRAMEDULLARY (IM) NAIL INTERTROCHANTERIC Left 02/04/2014   Procedure: INTRAMEDULLARY (IM) NAIL INTERTROCHANTRIC FEMORAL;  Surgeon: Shelda Pal, MD;  Location: [facility];  Service: Orthopedics;  Laterality: Left;   IR GENERIC HISTORICAL  03/01/2016   IR ANGIO INTRA EXTRACRAN SEL COM CAROTID INNOMINATE UNI R MOD SED 03/01/2016 Julieanne Cotton, MD [facility]   IR GENERIC HISTORICAL  03/01/2016   IR ENDOVASC INTRACRANIAL INF OTHER THAN  THROMBO ART INC DIAG ANGIO 03/01/2016 Julieanne Cotton, MD [facility]   IR GENERIC HISTORICAL  03/01/2016   IR INTRAVSC STENT CERV CAROTID W/O EMB-PROT MOD SED INC ANGIO 03/01/2016 Julieanne Cotton, MD [facility]   IR GENERIC HISTORICAL  06/03/2016   IR RADIOLOGIST EVAL & MGMT 06/03/2016 [facility]   LOWER EXTREMITY ANGIOGRAPHY Left 02/09/2019   Procedure: LOWER EXTREMITY ANGIOGRAPHY;  Surgeon: Renford Dills, MD;  Location: [facility];  Service: Cardiovascular;  Laterality: Left;   LOWER EXTREMITY ANGIOGRAPHY Left 03/10/2019   Procedure: LOWER EXTREMITY ANGIOGRAPHY;  Surgeon: Renford Dills, MD;  Location: [facility];  Service: Cardiovascular;  Laterality: Left;   LOWER EXTREMITY ANGIOGRAPHY Left 04/27/2019   Procedure: LOWER EXTREMITY  ANGIOGRAPHY;  Surgeon: Renford Dills, MD;  Location: [facility];  Service: Cardiovascular;  Laterality: Left;   LOWER EXTREMITY ANGIOGRAPHY Left 06/08/2019   Procedure: Lower Extremity Angiography;  Surgeon: Renford Dills, MD;  Location: [facility];  Service: Cardiovascular;  Laterality: Left;   LOWER EXTREMITY ANGIOGRAPHY Left 04/03/2020   Procedure: Lower Extremity Angiography;  Surgeon: Annice Needy, MD;  Location: [facility];  Service: Cardiovascular;  Laterality: Left;   LOWER EXTREMITY ANGIOGRAPHY Right 10/20/2020   Procedure: Lower Extremity Angiography;  Surgeon: Renford Dills, MD;  Location: [facility];  Service: Cardiovascular;  Laterality: Right;   LOWER EXTREMITY ANGIOGRAPHY Left 01/23/2021   Procedure: LOWER EXTREMITY ANGIOGRAPHY;  Surgeon: Renford Dills, MD;  Location: [facility];  Service: Cardiovascular;  Laterality: Left;   LOWER EXTREMITY ANGIOGRAPHY Right 04/10/2021   Procedure: LOWER EXTREMITY ANGIOGRAPHY;  Surgeon: Renford Dills, MD;  Location: [facility];  Service: Cardiovascular;  Laterality: Right;   LOWER EXTREMITY ANGIOGRAPHY Right 01/28/2023   Procedure: Lower Extremity Angiography;  Surgeon: Renford Dills, MD;  Location: [facility];  Service: Cardiovascular;  Laterality: Right;   ORIF TOE FRACTURE Right 02/08/2014   Procedure: OPEN REDUCTION INTERNAL FIXATION Right METATARSAL  FRACTURE ;  Surgeon: Toni Arthurs, MD;  [facility];  Service: Orthopedics;  Laterality: Right;   PERIPHERAL VASCULAR CATHETERIZATION N/A 12/28/2015   Procedure: Abdominal Aortogram w/Lower Extremity;  Surgeon: Fransisco Hertz, MD;  Location: [facility];  Service: Cardiovascular;  Laterality: N/A;   RADIOLOGY WITH ANESTHESIA N/A 03/01/2016   Procedure: RADIOLOGY WITH ANESTHESIA;  Surgeon: Julieanne Cotton, MD;  [facility];  Service: Radiology;  Laterality: N/A;   VEIN HARVEST Right 02/28/2016    Procedure: RIGHT GREATER SAPHENOUS VEIN HARVEST;  Surgeon: Fransisco Hertz, MD;  [facility];  Service: Vascular;  Laterality: Right;    Social History Social History   Tobacco Use   Smoking status: Never   Smokeless tobacco: Never   Tobacco comments:    Never smoked  Substance Use Topics   Alcohol use: No    Alcohol/week: 0.0 standard drinks of alcohol   Drug use: No    Family History Family History  Problem Relation Age of Onset   Diabetes Other    Liver disease Mother    CVA Father    Diabetes Father    Hypertension Father     Allergies  Allergen Reactions   Egg-Derived Products Shortness Of Breath   Iodine Anaphylaxis   Penicillins Anaphylaxis    Tolerates ceftriaxone, cefazolin Did it involve swelling of the face/tongue/throat, SOB, or low BP? Unknown Did it involve sudden or severe rash/hives, skin peeling,  or any reaction on the inside of your mouth or nose? Unknown Did you need to seek medical attention at a hospital or doctor's office? Unknown When did it last happen?      Unknown If all above answers are "NO", may proceed with cephalosporin use.    Shellfish Allergy Anaphylaxis   Sulfa Antibiotics Anaphylaxis   Sulfacetamide Sodium Anaphylaxis   Sulfasalazine Anaphylaxis   Morphine And Codeine Other (See Comments)    Altered mental status     REVIEW OF SYSTEMS (Negative unless checked)  Constitutional: [] Weight loss  [] Fever  [] Chills Cardiac: [] Chest pain   [] Chest pressure   [] Palpitations   [] Shortness of breath when laying flat   [] Shortness of breath with exertion. Vascular:  [x] Pain in legs with walking   [] Pain in legs at rest  [] History of DVT   [] Phlebitis   [] Swelling in legs   [] Varicose veins   [] Non-healing ulcers Pulmonary:   [] Uses home oxygen   [] Productive cough   [] Hemoptysis   [] Wheeze  [] COPD   [] Asthma Neurologic:  [] Dizziness   [] Seizures   [] History of stroke   [] History of TIA  [] Aphasia   [] Vissual changes   [] Weakness or  numbness in arm   [] Weakness or numbness in leg Musculoskeletal:   [] Joint swelling   [] Joint pain   [] Low back pain Hematologic:  [] Easy bruising  [] Easy bleeding   [] Hypercoagulable state   [] Anemic Gastrointestinal:  [] Diarrhea   [] Vomiting  [] Gastroesophageal reflux/heartburn   [] Difficulty swallowing. Genitourinary:  [] Chronic kidney disease   [] Difficult urination  [] Frequent urination   [] Blood in urine Skin:  [] Rashes   [] Ulcers  Psychological:  [] History of anxiety   []  History of major depression.  Physical Examination  There were no vitals filed for this visit. There is no height or weight on file to calculate BMI. Gen: WD/WN, seen in a gurney Head: [place]/AT, No temporalis wasting.  Ear/Nose/Throat: Hearing grossly intact, nares w/o erythema or drainage Eyes: PER, EOMI, sclera nonicteric.  Neck: Supple, no masses.  No bruit or JVD.  Pulmonary:  Good air movement, no audible wheezing, no use of accessory muscles.  Cardiac: RRR, normal S1, S2, no Murmurs. Vascular:  mild trophic changes,  open wounds noninfected Vessel Right Left  Radial Palpable Palpable  PT Not Palpable Not Palpable  DP Not Palpable Not Palpable  Gastrointestinal: soft, non-distended. No guarding/no peritoneal signs.  Musculoskeletal: M/S 5/5 throughout.  No visible deformity.  Neurologic: CN 2-12 intact. Pain and light touch intact in extremities.  Symmetrical.  Speech is fluent. Motor exam as listed above. Psychiatric: Judgment intact, Mood & affect appropriate for pt's clinical situation. Dermatologic: No rashes + ulcers noted.  No changes consistent with cellulitis.   CBC Lab Results  Component Value Date   WBC 12.7 (H) 02/05/2023   HGB 8.7 (L) 02/05/2023   HCT 28.4 (L) 02/05/2023   MCV 83.0 02/05/2023   PLT 388 02/05/2023    BMET    Component Value Date/Time   NA 135 02/06/2023 0719   K 3.8 02/06/2023 0719   CL 104 02/06/2023 0719   CO2 25 02/06/2023 0719   GLUCOSE 161 (H) 02/06/2023 0719    BUN 11 02/06/2023 0719   CREATININE 0.53 02/06/2023 0719   CALCIUM 8.4 (L) 02/06/2023 0719   GFRNONAA >60 02/06/2023 0719   GFRAA >60 04/06/2020 0535   Estimated Creatinine Clearance: 57.6 mL/min (by C-G formula based on SCr of 0.53 mg/dL).  COAG Lab Results  Component Value Date  INR 1.1 03/06/2021   INR 1.0 10/19/2020   INR 1.1 03/31/2020    Radiology ECHOCARDIOGRAM COMPLETE  Result Date: 02/04/2023    ECHOCARDIOGRAM REPORT   Patient Name:   TUERE DARRIN Paulson Date of Exam: 02/04/2023 Medical Rec #:  [id number]     Height:       69.0 in Accession #:    [id number]    Weight:       143.3 lb Date of Birth:  1941-01-28      BSA:          1.793 m Patient Age:    82 years      BP:           175/63 mmHg Patient Gender: F             HR:           70 bpm. [name] Procedure: 2D Echo, Cardiac Doppler and Color Doppler Indications:    Abnormal ECG R94.31  History:        Patient has prior history of Echocardiogram examinations, most                 recent 03/08/2021. CHF, Abnormal ECG, COPD, PAD and Stroke, Aortic                 Valve Disease, Signs/Symptoms:Altered Mental Status; Risk                 Factors:Hypertension, Dyslipidemia and Diabetes.  Sonographer:    Aron Baba Referring Phys: [id number] PRATIK D [facility]  Sonographer Comments: Image acquisition challenging due to respiratory motion. IMPRESSIONS  1. Left ventricular ejection fraction, by estimation, is 65 to 70%. The left ventricle has normal function. The left ventricle has no regional wall motion abnormalities. There is moderate asymmetric left ventricular hypertrophy of the septal segment. Left ventricular diastolic parameters are consistent with Grade I diastolic dysfunction (impaired relaxation). Elevated left atrial pressure.  2. Right ventricular systolic function is normal. The right ventricular size is normal. Tricuspid regurgitation signal is inadequate for assessing PA pressure.  3. Left atrial size was severely dilated.   4. Moderate pericardial effusion. The pericardial effusion is posterior to the left ventricle. There is no evidence of cardiac tamponade.  5. The mitral valve is abnormal. Trivial mitral valve regurgitation. No evidence of mitral stenosis. Moderate mitral annular calcification.  6. The aortic valve is tricuspid. There is moderate calcification of the aortic valve. There is moderate thickening of the aortic valve. Aortic valve regurgitation is not visualized. Mild to moderate aortic valve stenosis.  7. The inferior vena cava is normal in size with greater than 50% respiratory variability, suggesting right atrial pressure of 3 mmHg. FINDINGS  Left Ventricle: Left ventricular ejection fraction, by estimation, is 65 to 70%. The left ventricle has normal function. The left ventricle has no regional wall motion abnormalities. The left ventricular internal cavity size was normal in size. There is  moderate asymmetric left ventricular hypertrophy of the septal segment. Left ventricular diastolic parameters are consistent with Grade I diastolic dysfunction (impaired relaxation). Elevated left atrial pressure. Right Ventricle: The right ventricular size is normal. Right vetricular wall thickness was not well visualized. Right ventricular systolic function is normal. Tricuspid regurgitation signal is inadequate for assessing PA pressure. Left Atrium: Left atrial size was severely dilated. Right Atrium: Right atrial size was normal in size. Pericardium: A moderately sized pericardial effusion is present. The pericardial effusion is posterior to the left ventricle.  There is no evidence of cardiac tamponade. Mitral Valve: The mitral valve is abnormal. There is mild thickening of the mitral valve leaflet(s). There is mild calcification of the mitral valve leaflet(s). Moderate mitral annular calcification. Trivial mitral valve regurgitation. No evidence of mitral valve stenosis. Tricuspid Valve: The tricuspid valve is normal in  structure. Tricuspid valve regurgitation is not demonstrated. No evidence of tricuspid stenosis. Aortic Valve: The aortic valve is tricuspid. There is moderate calcification of the aortic valve. There is moderate thickening of the aortic valve. There is moderate aortic valve annular calcification. Aortic valve regurgitation is not visualized. Mild to moderate aortic stenosis is present. Aortic valve mean gradient measures 11.0 mmHg. Aortic valve peak gradient measures 22.0 mmHg. Aortic valve area, by VTI measures 1.21 cm. Pulmonic Valve: The pulmonic valve was not well visualized. Pulmonic valve regurgitation is mild. No evidence of pulmonic stenosis. Aorta: The aortic root is normal in size and structure. Venous: The inferior vena cava is normal in size with greater than 50% respiratory variability, suggesting right atrial pressure of 3 mmHg. IAS/Shunts: No atrial level shunt detected by color flow Doppler.  LEFT VENTRICLE PLAX 2D LVIDd:         3.40 cm   Diastology LVIDs:         2.10 cm   LV e' medial:    3.50 cm/s LV PW:         1.10 cm   LV E/e' medial:  29.4 LV IVS:        1.40 cm   LV e' lateral:   4.19 cm/s LVOT diam:     2.10 cm   LV E/e' lateral: 24.6 LV SV:         56 LV SV Index:   31 LVOT Area:     3.46 cm  RIGHT VENTRICLE RV S prime:     9.08 cm/s TAPSE (M-mode): 1.6 cm LEFT ATRIUM            Index        RIGHT ATRIUM           Index LA diam:      3.80 cm  2.12 cm/m   RA Area:     12.20 cm LA Vol (A2C): 54.8 ml  30.56 ml/m  RA Volume:   23.60 ml  13.16 ml/m LA Vol (A4C): 131.0 ml 73.06 ml/m  AORTIC VALVE                     PULMONIC VALVE AV Area (Vmax):    1.07 cm      PR End Diast Vel: 3.30 msec AV Area (Vmean):   1.05 cm AV Area (VTI):     1.21 cm AV Vmax:           234.33 cm/s AV Vmean:          151.667 cm/s AV VTI:            0.466 m AV Peak Grad:      22.0 mmHg AV Mean Grad:      11.0 mmHg LVOT Vmax:         72.30 cm/s LVOT Vmean:        45.900 cm/s LVOT VTI:          0.163 m LVOT/AV  VTI ratio: 0.35  AORTA Ao Root diam: 3.00 cm Ao Asc diam:  3.40 cm MITRAL VALVE MV Area (PHT): 2.62 cm     SHUNTS MV Decel Time: 290  msec     Systemic VTI:  0.16 m MV E velocity: 103.00 cm/s  Systemic Diam: 2.10 cm MV A velocity: 131.00 cm/s MV E/A ratio:  0.79 Dina Rich MD Electronically signed by Dina Rich MD Signature Date/Time: 02/04/2023/10:12:48 AM    Final    CT ABDOMEN PELVIS WO CONTRAST  Result Date: 02/03/2023 CLINICAL DATA:  Acute abdominal pain EXAM: CT ABDOMEN AND PELVIS WITHOUT CONTRAST TECHNIQUE: Multidetector CT imaging of the abdomen and pelvis was performed following the standard protocol without IV contrast. RADIATION DOSE REDUCTION: This exam was performed according to the departmental dose-optimization program which includes automated exposure control, adjustment of the mA and/or kV according to patient size and/or use of iterative reconstruction technique. COMPARISON:  03/06/2021 FINDINGS: Lower chest: Small bilateral pleural effusions with associated passive atelectasis. Suspected old granulomatous disease. 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 Moderate cardiomegaly. Aortic and mitral valve calcifications. Aortic and left anterior descending coronary artery atherosclerotic vascular disease. We partially image a 1.6 by 1.3 cm right lower lobe nodule on image 1 series 9, uncertain whether this represents a nodular airspace opacity or neoplastic nodule. Airway thickening noted with mild airway plugging in the lower lobes. Patchy mosaic attenuation in the right middle lobe is nonspecific but could reflect edema Nodule along the upper medial right breast measures 1.2 cm in diameter on image 17 of series 7 and was previously less solid-appearing and 0.8 cm in diameter on 03/06/2021. This could be an inflammatory lesion although malignancy is not excluded. Hepatobiliary:  Cholelithiasis noted including a 0.8 cm gallstone on image 37 series 7. Pancreas: Unremarkable Spleen: Unremarkable Adrenals/Urinary Tract: Stable thickened appearance of the adrenal glands. This is not changed from 03/03/2016 and is considered benign. No further workup of the adrenal glands is currently indicated based solely on imaging appearance. Two Bosniak category 1 cysts of the left kidney have fluid density. No further imaging workup of these lesions is indicated. 2 mm right kidney lower pole nonobstructive renal calculus. 3 mm left kidney lower pole nonobstructive renal calculus. No hydronephrosis or hydroureter. Right posterior bladder diverticulum noted. Stomach/Bowel: Small type 1 hiatal hernia. Proximal gastric wall thickening is probably related to nondistention. Mild prominence of stool in the rectum, cannot exclude mild/early fecal impaction. Distal descending and sigmoid colon diverticulosis without findings of active diverticulitis. The appendix does not appear inflamed. No dilated small bowel. Vascular/Lymphatic: Atherosclerosis is present, including aortoiliac atherosclerotic disease. 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 No pathologic adenopathy is identified. Reproductive: Calcified uterine fibroids measuring up to 1.9 cm in diameter. Stable adnexal calcifications. No further workup of these lesions is indicated. Other: No supplemental non-categorized findings. Musculoskeletal: Bilateral groin hernias containing adipose tissue with some stranding along the left groin hernia and some postoperative findings along the left common iliac vasculature. Left proximal femoral IM nail system noted. Bony demineralization.  General regional muscular atrophy. High density material within the superficial umbilicus, possibly calcification or foreign body. Lumbar spondylosis and degenerative disc disease, potential  left foraminal impingement at L5-S1 due to spurring. Mild dextroconvex lumbar scoliosis. Chronic superior endplate compression fracture at L3 with 30% loss of vertebral body height (this fracture was shown to be acute on the prior MRI from 09/13/2020). IMPRESSION: 1. Small bilateral pleural effusions with associated passive atelectasis. 2. Large pericardial effusion especially adjacent to the left ventricular  apex, but not substantially changed from 2022 hence considered chronic. 3. Moderate cardiomegaly. 4. We partially image a 1.6 by 1.3 cm right lower lobe nodule, uncertain whether this represents a nodular airspace opacity or neoplastic nodule. Consider one of the following in 3 months for both low-risk and high-risk individuals: (a) repeat chest CT, (b) follow-up PET-CT, or (c) tissue sampling. This recommendation follows the consensus statement: Guidelines for Management of Incidental Pulmonary Nodules Detected on CT Images: From the Fleischner Society 2017; Radiology 2017; 284:228-243. 5. Nodule along the upper medial right breast measures 1.2 cm in diameter and was previously less solid-appearing and 0.8 cm in diameter on 03/06/2021. This could be an inflammatory lesion although malignancy is not excluded. If the patient is not up-to-date on mammography then diagnostic mammography would be recommended. 6. Cholelithiasis. 7. Nonobstructive bilateral nephrolithiasis. 8. Small type 1 hiatal hernia. 9. Mild prominence of stool in the rectum, cannot exclude mild/early fecal impaction. 10. Distal descending and sigmoid colon diverticulosis without findings of active diverticulitis. 11. Chronic superior endplate compression fracture at L3 with 30% loss of vertebral body height. 12. Aortic, coronary, and systemic atherosclerosis. Aortic Atherosclerosis (ICD10-I70.0). Electronically Signed   By: Gaylyn Rong M.D.   On: 02/03/2023 15:38   CT Head Wo Contrast  Result Date: 02/03/2023 CLINICAL DATA:  Delirium.  EXAM: CT HEAD WITHOUT CONTRAST TECHNIQUE: Contiguous axial images were obtained from the base of the skull through the vertex without intravenous contrast. RADIATION DOSE REDUCTION: This exam was performed according to the departmental dose-optimization program which includes automated exposure control, adjustment of the mA and/or kV according to patient size and/or use of iterative reconstruction technique. COMPARISON:  Head CT 03/06/2021 FINDINGS: Brain: No evidence of acute infarction, hemorrhage, hydrocephalus, extra-axial collection or mass effect. Generalized brain atrophy. 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 Small bilateral cerebellar infarcts more extensive on the left by coronal reformats. 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 Skull: Normal. Negative for fracture or focal lesion. Sinuses/Orbits: No acute finding. IMPRESSION: 1. No acute finding. 2. 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 3. 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 Electronically Signed   By: Tiburcio Pea M.D.   On: 02/03/2023 15:27   DG Chest 1 View  Result Date: 02/03/2023 CLINICAL DATA:  Incisional bleeding. EXAM: CHEST  1 VIEW COMPARISON:  X-ray 03/06/2021. FINDINGS: Enlarged cardiopericardial silhouette with small bilateral pleural effusions. Basilar atelectasis as well. No pneumothorax or edema. Film is rotated to the left. Overlapping cardiac leads. Osteopenia. IMPRESSION: Enlarged heart.  Small pleural effusions. Electronically Signed   By: Karen Kays M.D.   On: 02/03/2023 12:44   PERIPHERAL VASCULAR CATHETERIZATION  Result Date: 01/28/2023 See surgical note for result.    Assessment/Plan 1. Atherosclerosis of native arteries of the extremities with ulceration (HCC) Recommend:  The patient is status post successful angiogram with  intervention.  The patient reports that the claudication symptoms and leg pain has improved.   The patient denies lifestyle limiting changes at this point in time.  No further invasive studies, angiography or surgery at this time The patient should continue walking and begin a more formal exercise program.  The patient should continue antiplatelet therapy and aggressive treatment of the lipid abnormalities  Continued surveillance is indicated as atherosclerosis is likely to progress with time.  Given the extensive nature of her atherosclerotic occlusive disease as well as her  comorbidities in addition with the multiple previous interventions in association with the limited options available surgically I will follow her at 69-month intervals rather than 6 months intervals.  She will return with an ABI.  Patient should undergo noninvasive studies as ordered. The patient will follow up with me to review the studies.  - VAS [place] ABI WITH/WO TBI; Future  2. Primary hypertension Continue antihypertensive medications as already ordered, these medications have been reviewed and there are no changes at this time.  3. Chronic obstructive pulmonary disease, unspecified COPD type (HCC) Continue pulmonary medications and aerosols as already ordered, these medications have been reviewed and there are no changes at this time.   4. Gastroesophageal reflux disease without esophagitis Continue PPI as already ordered, this medication has been reviewed and there are no changes at this time.  Avoidence of caffeine and alcohol  Moderate elevation of the head of the bed   5. Type 2 diabetes mellitus with other circulatory complication, with long-term current use of insulin (HCC) Continue hypoglycemic medications as already ordered, these medications have been reviewed and there are no changes at this time.  Hgb A1C to be monitored as already arranged by primary service    Levora Dredge,  MD  02/15/2023 3:58 PM

## 2023-02-17 ENCOUNTER — Ambulatory Visit (INDEPENDENT_AMBULATORY_CARE_PROVIDER_SITE_OTHER): Payer: Medicare HMO | Admitting: Vascular Surgery

## 2023-02-17 ENCOUNTER — Ambulatory Visit (INDEPENDENT_AMBULATORY_CARE_PROVIDER_SITE_OTHER): Payer: Medicare HMO

## 2023-02-17 ENCOUNTER — Encounter (INDEPENDENT_AMBULATORY_CARE_PROVIDER_SITE_OTHER): Payer: Self-pay | Admitting: Vascular Surgery

## 2023-02-17 VITALS — BP 133/79 | HR 97 | Resp 16

## 2023-02-17 DIAGNOSIS — I7025 Atherosclerosis of native arteries of other extremities with ulceration: Secondary | ICD-10-CM | POA: Diagnosis not present

## 2023-02-17 DIAGNOSIS — I739 Peripheral vascular disease, unspecified: Secondary | ICD-10-CM

## 2023-02-17 DIAGNOSIS — J449 Chronic obstructive pulmonary disease, unspecified: Secondary | ICD-10-CM

## 2023-02-17 DIAGNOSIS — I10 Essential (primary) hypertension: Secondary | ICD-10-CM

## 2023-02-17 DIAGNOSIS — Z9889 Other specified postprocedural states: Secondary | ICD-10-CM

## 2023-02-17 DIAGNOSIS — E1159 Type 2 diabetes mellitus with other circulatory complications: Secondary | ICD-10-CM

## 2023-02-17 DIAGNOSIS — K219 Gastro-esophageal reflux disease without esophagitis: Secondary | ICD-10-CM

## 2023-02-17 DIAGNOSIS — Z794 Long term (current) use of insulin: Secondary | ICD-10-CM

## 2023-02-18 ENCOUNTER — Emergency Department (HOSPITAL_COMMUNITY): Payer: Medicare HMO

## 2023-02-18 ENCOUNTER — Other Ambulatory Visit: Payer: Self-pay

## 2023-02-18 ENCOUNTER — Inpatient Hospital Stay (HOSPITAL_COMMUNITY)
Admission: EM | Admit: 2023-02-18 | Discharge: 2023-02-21 | DRG: 193 | Disposition: A | Discharge status: Home Health | Payer: Medicare HMO | Attending: Family Medicine | Admitting: Family Medicine

## 2023-02-18 ENCOUNTER — Encounter (HOSPITAL_COMMUNITY): Payer: Self-pay

## 2023-02-18 ENCOUNTER — Ambulatory Visit: Payer: Medicare HMO | Attending: Physician Assistant | Admitting: Physician Assistant

## 2023-02-18 DIAGNOSIS — R627 Adult failure to thrive: Secondary | ICD-10-CM | POA: Diagnosis present

## 2023-02-18 DIAGNOSIS — Z8249 Family history of ischemic heart disease and other diseases of the circulatory system: Secondary | ICD-10-CM

## 2023-02-18 DIAGNOSIS — Z833 Family history of diabetes mellitus: Secondary | ICD-10-CM

## 2023-02-18 DIAGNOSIS — R638 Other symptoms and signs concerning food and fluid intake: Secondary | ICD-10-CM

## 2023-02-18 DIAGNOSIS — E1151 Type 2 diabetes mellitus with diabetic peripheral angiopathy without gangrene: Secondary | ICD-10-CM | POA: Diagnosis present

## 2023-02-18 DIAGNOSIS — Z91012 Allergy to eggs: Secondary | ICD-10-CM

## 2023-02-18 DIAGNOSIS — Z681 Body mass index (BMI) 19 or less, adult: Secondary | ICD-10-CM

## 2023-02-18 DIAGNOSIS — Z9842 Cataract extraction status, left eye: Secondary | ICD-10-CM

## 2023-02-18 DIAGNOSIS — Z7982 Long term (current) use of aspirin: Secondary | ICD-10-CM

## 2023-02-18 DIAGNOSIS — J189 Pneumonia, unspecified organism: Secondary | ICD-10-CM | POA: Diagnosis not present

## 2023-02-18 DIAGNOSIS — Y95 Nosocomial condition: Secondary | ICD-10-CM | POA: Diagnosis present

## 2023-02-18 DIAGNOSIS — D649 Anemia, unspecified: Secondary | ICD-10-CM | POA: Diagnosis present

## 2023-02-18 DIAGNOSIS — L89312 Pressure ulcer of right buttock, stage 2: Secondary | ICD-10-CM | POA: Diagnosis present

## 2023-02-18 DIAGNOSIS — J449 Chronic obstructive pulmonary disease, unspecified: Secondary | ICD-10-CM | POA: Diagnosis present

## 2023-02-18 DIAGNOSIS — Z8673 Personal history of transient ischemic attack (TIA), and cerebral infarction without residual deficits: Secondary | ICD-10-CM

## 2023-02-18 DIAGNOSIS — E782 Mixed hyperlipidemia: Secondary | ICD-10-CM | POA: Diagnosis present

## 2023-02-18 DIAGNOSIS — E876 Hypokalemia: Secondary | ICD-10-CM | POA: Diagnosis not present

## 2023-02-18 DIAGNOSIS — J9811 Atelectasis: Secondary | ICD-10-CM | POA: Diagnosis present

## 2023-02-18 DIAGNOSIS — Z882 Allergy status to sulfonamides status: Secondary | ICD-10-CM

## 2023-02-18 DIAGNOSIS — J440 Chronic obstructive pulmonary disease with acute lower respiratory infection: Secondary | ICD-10-CM | POA: Diagnosis present

## 2023-02-18 DIAGNOSIS — Z79899 Other long term (current) drug therapy: Secondary | ICD-10-CM

## 2023-02-18 DIAGNOSIS — I3139 Other pericardial effusion (noninflammatory): Secondary | ICD-10-CM | POA: Diagnosis present

## 2023-02-18 DIAGNOSIS — E1140 Type 2 diabetes mellitus with diabetic neuropathy, unspecified: Secondary | ICD-10-CM | POA: Diagnosis present

## 2023-02-18 DIAGNOSIS — Z89422 Acquired absence of other left toe(s): Secondary | ICD-10-CM

## 2023-02-18 DIAGNOSIS — I5032 Chronic diastolic (congestive) heart failure: Secondary | ICD-10-CM | POA: Diagnosis present

## 2023-02-18 DIAGNOSIS — D329 Benign neoplasm of meninges, unspecified: Secondary | ICD-10-CM | POA: Diagnosis present

## 2023-02-18 DIAGNOSIS — I110 Hypertensive heart disease with heart failure: Secondary | ICD-10-CM | POA: Diagnosis present

## 2023-02-18 DIAGNOSIS — R531 Weakness: Principal | ICD-10-CM

## 2023-02-18 DIAGNOSIS — Z885 Allergy status to narcotic agent status: Secondary | ICD-10-CM

## 2023-02-18 DIAGNOSIS — R911 Solitary pulmonary nodule: Secondary | ICD-10-CM | POA: Insufficient documentation

## 2023-02-18 DIAGNOSIS — J90 Pleural effusion, not elsewhere classified: Secondary | ICD-10-CM | POA: Insufficient documentation

## 2023-02-18 DIAGNOSIS — Z9841 Cataract extraction status, right eye: Secondary | ICD-10-CM

## 2023-02-18 DIAGNOSIS — G9341 Metabolic encephalopathy: Secondary | ICD-10-CM | POA: Diagnosis present

## 2023-02-18 DIAGNOSIS — K219 Gastro-esophageal reflux disease without esophagitis: Secondary | ICD-10-CM | POA: Diagnosis present

## 2023-02-18 DIAGNOSIS — Z91013 Allergy to seafood: Secondary | ICD-10-CM

## 2023-02-18 DIAGNOSIS — E8809 Other disorders of plasma-protein metabolism, not elsewhere classified: Secondary | ICD-10-CM | POA: Diagnosis present

## 2023-02-18 DIAGNOSIS — E46 Unspecified protein-calorie malnutrition: Secondary | ICD-10-CM | POA: Diagnosis present

## 2023-02-18 DIAGNOSIS — Z515 Encounter for palliative care: Secondary | ICD-10-CM

## 2023-02-18 DIAGNOSIS — Z823 Family history of stroke: Secondary | ICD-10-CM

## 2023-02-18 DIAGNOSIS — Z91041 Radiographic dye allergy status: Secondary | ICD-10-CM

## 2023-02-18 DIAGNOSIS — E1165 Type 2 diabetes mellitus with hyperglycemia: Secondary | ICD-10-CM | POA: Diagnosis present

## 2023-02-18 DIAGNOSIS — Z794 Long term (current) use of insulin: Secondary | ICD-10-CM

## 2023-02-18 DIAGNOSIS — Z7902 Long term (current) use of antithrombotics/antiplatelets: Secondary | ICD-10-CM

## 2023-02-18 DIAGNOSIS — Z7951 Long term (current) use of inhaled steroids: Secondary | ICD-10-CM

## 2023-02-18 DIAGNOSIS — Z7901 Long term (current) use of anticoagulants: Secondary | ICD-10-CM

## 2023-02-18 LAB — COMPREHENSIVE METABOLIC PANEL
ALT: 30 U/L (ref 0–44)
AST: 25 U/L (ref 15–41)
Albumin: 2.9 g/dL — ABNORMAL LOW (ref 3.5–5.0)
Alkaline Phosphatase: 45 U/L (ref 38–126)
Anion gap: 8 (ref 5–15)
BUN: 19 mg/dL (ref 8–23)
CO2: 29 mmol/L (ref 22–32)
Calcium: 9.2 mg/dL (ref 8.9–10.3)
Chloride: 106 mmol/L (ref 98–111)
Creatinine, Ser: 0.6 mg/dL (ref 0.44–1.00)
GFR, Estimated: >60 mL/min (ref >60)
Glucose, Bld: 266 mg/dL — ABNORMAL HIGH (ref 70–99)
Potassium: 2.9 mmol/L — ABNORMAL LOW (ref 3.5–5.1)
Sodium: 143 mmol/L (ref 135–145)
Total Bilirubin: 0.8 mg/dL (ref 0.3–1.2)
Total Protein: 5.8 g/dL — ABNORMAL LOW (ref 6.5–8.1)

## 2023-02-18 LAB — URINALYSIS, W/ REFLEX TO CULTURE (INFECTION SUSPECTED)
Bilirubin Urine: NEGATIVE
Glucose, UA: NEGATIVE mg/dL
Hgb urine dipstick: NEGATIVE
Ketones, ur: NEGATIVE mg/dL
Leukocytes,Ua: NEGATIVE
Nitrite: NEGATIVE
Protein, ur: NEGATIVE mg/dL
Specific Gravity, Urine: 1.008 (ref 1.005–1.030)
pH: 5 (ref 5.0–8.0)

## 2023-02-18 LAB — CBC WITH DIFFERENTIAL/PLATELET
Abs Immature Granulocytes: 0.04 10*3/uL (ref 0.00–0.07)
Basophils Absolute: 0 10*3/uL (ref 0.0–0.1)
Basophils Relative: 0 %
Eosinophils Absolute: 0 10*3/uL (ref 0.0–0.5)
Eosinophils Relative: 0 %
HCT: 31.1 % — ABNORMAL LOW (ref 36.0–46.0)
Hemoglobin: 9.3 g/dL — ABNORMAL LOW (ref 12.0–15.0)
Immature Granulocytes: 0 %
Lymphocytes Relative: 12 %
Lymphs Abs: 1.2 10*3/uL (ref 0.7–4.0)
MCH: 24 pg — ABNORMAL LOW (ref 26.0–34.0)
MCHC: 29.9 g/dL — ABNORMAL LOW (ref 30.0–36.0)
MCV: 80.2 fL (ref 80.0–100.0)
Monocytes Absolute: 0.7 10*3/uL (ref 0.1–1.0)
Monocytes Relative: 7 %
Neutro Abs: 8.2 10*3/uL — ABNORMAL HIGH (ref 1.7–7.7)
Neutrophils Relative %: 81 %
Platelets: 360 10*3/uL (ref 150–400)
RBC: 3.88 MIL/uL (ref 3.87–5.11)
RDW: 17.4 % — ABNORMAL HIGH (ref 11.5–15.5)
WBC: 10.1 10*3/uL (ref 4.0–10.5)
nRBC: 0 % (ref 0.0–0.2)

## 2023-02-18 LAB — BLOOD GAS, VENOUS
Acid-Base Excess: 8.5 mmol/L — ABNORMAL HIGH (ref 0.0–2.0)
Bicarbonate: 33.4 mmol/L — ABNORMAL HIGH (ref 20.0–28.0)
Drawn by: 66297
FIO2: 21 %
O2 Saturation: 64.3 %
Patient temperature: 37
pCO2, Ven: 47 mmHg (ref 44–60)
pH, Ven: 7.46 — ABNORMAL HIGH (ref 7.25–7.43)
pO2, Ven: 36 mmHg (ref 32–45)

## 2023-02-18 LAB — CBG MONITORING, ED: Glucose-Capillary: 248 mg/dL — ABNORMAL HIGH (ref 70–99)

## 2023-02-18 LAB — MAGNESIUM: Magnesium: 2.3 mg/dL (ref 1.7–2.4)

## 2023-02-18 MED ORDER — SODIUM CHLORIDE 0.9 % IV SOLN
500.0000 mg | INTRAVENOUS | Status: DC
  Administered 2023-02-18 – 2023-02-20 (×3): 500 mg via INTRAVENOUS
  Filled 2023-02-18 (×3): qty 5

## 2023-02-18 MED ORDER — LACTATED RINGERS IV BOLUS
1000.0000 mL | Freq: Once | INTRAVENOUS | Status: AC
  Administered 2023-02-18: 1000 mL via INTRAVENOUS

## 2023-02-18 MED ORDER — SODIUM CHLORIDE 0.9 % IV SOLN
INTRAVENOUS | Status: AC
  Filled 2023-02-18: qty 12.5

## 2023-02-18 MED ORDER — SODIUM CHLORIDE 0.9 % IV SOLN
2.0000 g | Freq: Once | INTRAVENOUS | Status: AC
  Administered 2023-02-18: 2 g via INTRAVENOUS

## 2023-02-18 NOTE — ED Provider Notes (Signed)
[place] EMERGENCY DEPARTMENT AT [facility] Provider Note   CSN: [id number] Arrival date & time: 02/18/23  1409     History  Chief Complaint  Patient presents with   Altered Mental Status    [name] is a 82 y.o. female with PMH as listed below who presents from home via [facility]. Husband at bedside and son over the phone gives history. Recently admitted for acute metabolic encephalopathy from 02/03/2023 to 02/06/2023 in the setting of E. coli UTI.  Transition from meropenem to ceftriaxone based on susceptibility data and treated with 7 days of Omnicef.  Also had demand myocardial ischemia during this admission.  Also recently had right lower extremity angioplasty performed on 01/28/2023 for peripheral artery disease and chronic right foot wound.  Treated with Eliquis and Plavix.  Family states she has had significantly decreased PO intake d/t thrush and also can't take her medications. Very concerned about dehydration.  Also concerned she may have had a UTI because she isn't urinating x 2 days. Home nurses also were concerned that her right hand is "crippling up." Very "foggy," and "out of it." Were concerned maybe she had a stroke. Son states that she has not been acting normally since she left the hospital, also that she is extremely weak. Is normally alert and talkative but has been withdrawn since the hospital stay. She spits out water/food when they feed her x 1 week.      Past Medical History:  Diagnosis Date   Acute heart failure (HCC)    Aortic stenosis    CHF (congestive heart failure) (HCC)    Complication of anesthesia    Hard to wake patient up after having anesthesia   Diabetes mellitus without complication (HCC)    Diabetic neuropathy (HCC)    Takes Lyrica   Edema of both legs    Takes Lasix   GERD (gastroesophageal reflux disease)    High cholesterol    HTN (hypertension)    Hypertension    PAD (peripheral artery disease) (HCC)    Shortness of  breath dyspnea    Spasm of back muscles    Stroke (HCC)    Wound, open    Right great toe       Home Medications Prior to Admission medications   Medication Sig Start Date End Date Taking? Authorizing Provider  acetaminophen (TYLENOL) 500 MG tablet Take 1,000 mg by mouth every 6 (six) hours as needed for mild pain.    [provider]  amLODipine (NORVASC) 10 MG tablet Take 10 mg by mouth daily.    [provider]  apixaban (ELIQUIS) 2.5 MG TABS tablet Take 1 tablet (2.5 mg total) by mouth 2 (two) times daily. 01/28/23   Schnier, Latina Craver, MD  aspirin EC 81 MG EC tablet Take 1 tablet (81 mg total) by mouth daily. 04/29/19   Stegmayer, Cala Bradford A, PA-C  atorvastatin (LIPITOR) 20 MG tablet Take 1 tablet (20 mg total) by mouth daily. Patient taking differently: Take 20 mg by mouth at bedtime. 03/13/16   Maris Berger A, PA-C  budesonide (PULMICORT) 0.25 MG/2ML nebulizer solution Take 2 mLs (0.25 mg total) by nebulization 2 (two) times daily. 06/10/18   Auburn Bilberry, MD  carbamide peroxide (DEBROX) 6.5 % OTIC solution Place 5 drops into both ears 2 (two) times daily as needed (ear wax).    [provider]  Carboxymethylcellul-Glycerin (LUBRICATING EYE DROPS OP) Place 1 drop into both eyes daily as needed (dry eyes).  [provider]  carvedilol (COREG) 6.25 MG tablet Take 6.25 mg by mouth 2 (two) times daily with a meal.    [provider]  cetirizine (ZYRTEC) 10 MG tablet Take 10 mg by mouth daily.    [provider]  cholecalciferol (VITAMIN D3) 25 MCG (1000 UNIT) tablet Take 1,000 Units by mouth daily.    [provider]  clopidogrel (PLAVIX) 75 MG tablet Take 1 tablet (75 mg total) by mouth daily with breakfast. 03/04/17   Fransisco Hertz, MD  diclofenac Sodium (VOLTAREN) 1 % GEL Apply 1 application topically 4 (four) times daily as needed (pain).    [provider]  docusate sodium (COLACE) 100 MG capsule Take 100 mg by  mouth daily.    [provider]  fluticasone (FLONASE) 50 MCG/ACT nasal spray Place 1 spray into both nostrils daily as needed for allergies.     [provider]  gabapentin (NEURONTIN) 300 MG capsule Take 300 mg by mouth at bedtime.    [provider]  hydrALAZINE (APRESOLINE) 25 MG tablet Take 1 tablet (25 mg total) by mouth every 8 (eight) hours. 12/28/18   Enedina Finner, MD  insulin glargine (LANTUS) 100 UNIT/ML injection Inject 0.1 mLs (10 Units total) into the skin at bedtime. Patient taking differently: Inject 18 Units into the skin at bedtime. 03/10/21   Erick Blinks, MD  magnesium hydroxide (MILK OF MAGNESIA) 400 MG/5ML suspension Take 15 mLs by mouth daily as needed for mild constipation.    [provider]  magnesium oxide (MAG-OX) 400 MG tablet Take 400 mg by mouth 2 (two) times daily.     [provider]  modafinil (PROVIGIL) 200 MG tablet Take 200 mg by mouth daily.    [provider]  [facility] powder Apply 1 application topically daily. 03/05/21   [provider]  omeprazole (PRILOSEC) 20 MG capsule Take 1 capsule (20 mg total) by mouth daily. 04/17/18   Houston Siren, MD  polyethylene glycol (MIRALAX / GLYCOLAX) packet Take 17 g by mouth 2 (two) times daily. Patient taking differently: Take 17 g by mouth daily as needed for moderate constipation. 02/09/14   Leroy Sea, MD  potassium chloride SA (K-DUR,KLOR-CON) 20 MEQ tablet Take 20 mEq by mouth daily.    [provider]  torsemide (DEMADEX) 20 MG tablet Take 1 tablet (20 mg total) by mouth 2 (two) times daily. 12/28/18   Enedina Finner, MD  Vitamin E 180 MG CAPS Take 180 mg by mouth daily.    [provider]      Allergies    Egg-derived products, Iodine, Penicillins, Shellfish allergy, Sulfa antibiotics, Sulfacetamide sodium, Sulfasalazine, and Morphine and codeine    Review of Systems   Review of Systems A 10 point review of systems was performed  and is negative unless otherwise reported in HPI.  Physical Exam Updated Vital Signs BP (!) 115/95   Pulse 73   Temp 98.2 F (36.8 C)   Resp 18   Wt 60.1 kg   SpO2 98%   BMI 19.57 kg/m  Physical Exam General: chronically ill-appearing elderly female, lying in bed.  HEENT: PERRLA, Sclera anicteric, dry mucous membranes with clear oropharynx, trachea midline.  Cardiology: RRR, no murmurs/rubs/gallops. BL radial and DP pulses equal bilaterally.  Resp: Normal respiratory rate and effort. CTAB, no wheezes, rhonchi, crackles.  Abd: Soft, non-tender, non-distended. No rebound tenderness or guarding.  GU: Deferred. MSK: No peripheral edema or signs of trauma. Extremities without  deformity or TTP. No cyanosis or clubbing. Skin: warm, dry.  Neuro: Somnolent, minimally interactive, oriented x1, CNs II-XII grossly intact. R-sided weakness from prior stroke. Holding R arm/hand against her body. Sensation grossly intact. Follows most commands with prompting.   ED Results / Procedures / Treatments   Labs (all labs ordered are listed, but only abnormal results are displayed) Labs Reviewed  URINALYSIS, W/ REFLEX TO CULTURE (INFECTION SUSPECTED) - Abnormal; Notable for the following components:      Result Value   Color, Urine STRAW (*)    All other components within normal limits  CBC WITH DIFFERENTIAL/PLATELET - Abnormal; Notable for the following components:   Hemoglobin 9.3 (*)    HCT 31.1 (*)    MCH 24.0 (*)    MCHC 29.9 (*)    RDW 17.4 (*)    Neutro Abs 8.2 (*)    All other components within normal limits  COMPREHENSIVE METABOLIC PANEL - Abnormal; Notable for the following components:   Potassium 2.9 (*)    Glucose, Bld 266 (*)    Total Protein 5.8 (*)    Albumin 2.9 (*)    All other components within normal limits  BLOOD GAS, VENOUS - Abnormal; Notable for the following components:   pH, Ven 7.46 (*)    Bicarbonate 33.4 (*)    Acid-Base Excess 8.5 (*)    All other components  within normal limits  COMPREHENSIVE METABOLIC PANEL - Abnormal; Notable for the following components:   Potassium 2.7 (*)    Glucose, Bld 204 (*)    Total Protein 5.6 (*)    Albumin 2.8 (*)    All other components within normal limits  CBC - Abnormal; Notable for the following components:   WBC 12.7 (*)    RBC 3.86 (*)    Hemoglobin 9.2 (*)    HCT 31.0 (*)    MCH 23.8 (*)    MCHC 29.7 (*)    RDW 17.2 (*)    All other components within normal limits  PHOSPHORUS - Abnormal; Notable for the following components:   Phosphorus 1.8 (*)    All other components within normal limits  GLUCOSE, CAPILLARY - Abnormal; Notable for the following components:   Glucose-Capillary 211 (*)    All other components within normal limits    EKG EKG Interpretation Date/Time:  Tuesday February 18 2023 15:32:24 EDT Ventricular Rate:  84 PR Interval:  137 QRS Duration:  89 QT Interval:  347 QTC Calculation: 411 R Axis:   71  Text Interpretation: Sinus rhythm Nonspecific repol abnormality, diffuse leads Baseline artifact Similar to prior EKGs Confirmed by Vivi Barrack [phone]) on 02/18/2023 4:36:16 PM  Radiology DG Chest Portable 1 View  Result Date: 02/18/2023 CLINICAL DATA:  Altered mental status, tachypnea EXAM: PORTABLE CHEST 1 VIEW COMPARISON:  02/03/2023, CT 06/03/2018 FINDINGS: Retrocardiac opacification persists, likely related to a small to moderate left pleural effusion with associated left basilar atelectasis or infiltrate. Small right pleural effusion is unchanged. Pulmonary insufflation is stable. 16 mm nodule is seen within the a right mid lung zone which demonstrates slow but progressive enlargement since remote prior CT examination is suspicious for a low-grade malignancy. No pneumothorax. Stable cardiomegaly. Pulmonary vascularity is normal. No acute bone abnormality. IMPRESSION: 1. Small to moderate left pleural effusion with associated left basilar atelectasis or infiltrate. 2. Small right  pleural effusion. 3. 16 mm nodule within the right mid lung zone which demonstrates slow but progressive enlargement since remote prior CT examination is suspicious  for a low-grade malignancy. Dedicated nonemergent contrast enhanced CT examination is recommended for further characterization once the patient's acute clinical issues have resolved. Electronically Signed   By: Helyn Numbers M.D.   On: 02/18/2023 21:46    Procedures Procedures    Medications Ordered in ED Medications  azithromycin (ZITHROMAX) 500 mg in sodium chloride 0.9 % 250 mL IVPB (500 mg Intravenous New Bag/Given 02/19/23 2305)  sodium chloride 0.9 % with ceFEPIme (MAXIPIME) ADS Med (  Not Given 02/18/23 2302)  acetaminophen (TYLENOL) tablet 650 mg (has no administration in time range)    Or  acetaminophen (TYLENOL) suppository 650 mg (has no administration in time range)  lactated ringers bolus 1,000 mL (0 mLs Intravenous Stopped 02/18/23 2152)  ceFEPIme (MAXIPIME) 2 g in sodium chloride 0.9 % 100 mL IVPB (0 g Intravenous Stopped 02/18/23 2302)    ED Course/ Medical Decision Making/ A&P                          Medical Decision Making Amount and/or Complexity of Data Reviewed Labs: ordered. Decision-making details documented in ED Course. Radiology: ordered. Decision-making details documented in ED Course.  Risk Decision regarding hospitalization.    This patient presents to the ED for concern of AMS, gen weakness, poor PO intake, this involves an extensive number of treatment options, and is a complaint that carries with it a high risk of complications and morbidity.  I considered the following differential and admission for this acute, potentially life threatening condition.   MDM:    Ddx of acute altered mental status or encephalopathy considered but not limited to: -Intracranial abnormalities such as ICH, hydrocephalus, head trauma - no report of falls and is NCAT on exam. She does have R sided weakness and  contracture on the R from old stroke but unclear how weak it typically is, or if holding her hand against her body is normal, she is minimally responsive now though looking around the room, will obtain MRI to r/o new CVA -Infection such as UTI, PNA - will check urine/CXR -Toxic ingestion or polypharmacy - no recent changes to meds.  -Electrolyte abnormalities or hyper/hypoglycemia esp given significantly decreased PO intake. Family reports recently treated w/ thrush, has two days left of nystatin, reports that this is why she is not taking PO because it must be painful, but I do not see any residual thrush currently on exam. -Hypercarbia or hypoxia - VBG shows no hypercarbia, she is not hypoxic on Spo2 -Hepatic encephalopathy or uremia -ACS or arrhythmia   Clinical Course as of 02/20/23 2053  Tue Feb 18, 2023  1607 Glucose-Capillary(!): 248 [HN]  1624 Urinalysis, w/ Reflex to Culture (Infection Suspected) -Urine, Clean Catch(!) No UTI [HN]  1636 Hemoglobin(!): 9.3 BL [HN]  1827 CT Head Wo Contrast Unchanged head CT [HN]  2047 MR BRAIN WO CONTRAST 1. No acute intracranial abnormality. 2. 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   [HN]  2047 MRI shows no acute stroke [HN]  2155 DG Chest Portable 1 View 1. Small to moderate left pleural effusion with associated left basilar atelectasis or infiltrate. 2. Small right pleural effusion. 3. 16 mm nodule within the right mid lung zone which demonstrates slow but progressive enlargement since remote prior CT examination is suspicious for a low-grade malignancy. Dedicated nonemergent contrast enhanced CT examination is recommended for further characterization once the patient's acute clinical issues have resolved.   [HN]  Clinical Course User Index [HN] Loetta Rough, MD    Chest x-ray shows pneumonia with parapneumonic effusion.  Given recent admission will treat with cefepime and vancomycin.   Blood cultures and urine culture also drawn.  Patient will need admission to the hospital for HCAP, encephalopathy, failure to thrive, poor p.o. intake.   Labs: I Ordered, and personally interpreted labs.  The pertinent results include:  those listed above  Imaging Studies ordered: I ordered imaging studies including CTH, MRI, CXR I independently visualized and interpreted imaging. I agree with the radiologist interpretation  Additional history obtained from chart review, husband and son at bedside.    Cardiac Monitoring: The patient was maintained on a cardiac monitor.  I personally viewed and interpreted the cardiac monitored which showed an underlying rhythm of: NSR  Reevaluation: After the interventions noted above, I reevaluated the patient and found that they have : stayed the same  Social Determinants of Health: lives with family  Disposition:  Admit to hospitalist Dr. Thomes Dinning  Co morbidities that complicate the patient evaluation  Past Medical History:  Diagnosis Date   Acute heart failure (HCC)    Aortic stenosis    CHF (congestive heart failure) (HCC)    Complication of anesthesia    Hard to wake patient up after having anesthesia   Diabetes mellitus without complication (HCC)    Diabetic neuropathy (HCC)    Takes Lyrica   Edema of both legs    Takes Lasix   GERD (gastroesophageal reflux disease)    High cholesterol    HTN (hypertension)    Hypertension    PAD (peripheral artery disease) (HCC)    Shortness of breath dyspnea    Spasm of back muscles    Stroke (HCC)    Wound, open    Right great toe     Medicines Meds ordered this encounter  Medications   lactated ringers bolus 1,000 mL   ceFEPIme (MAXIPIME) 2 g in sodium chloride 0.9 % 100 mL IVPB   azithromycin (ZITHROMAX) 500 mg in sodium chloride 0.9 % 250 mL IVPB    Order Specific Question:   Antibiotic Indication:    Answer:   CAP   sodium chloride 0.9 % with ceFEPIme (MAXIPIME) ADS Med     Grant Fontana : cabinet override   DISCONTD: enoxaparin (LOVENOX) injection 40 mg   OR Linked Order Group    acetaminophen (TYLENOL) tablet 650 mg    acetaminophen (TYLENOL) suppository 650 mg   OR Linked Order Group    ondansetron (ZOFRAN) tablet 4 mg    ondansetron (ZOFRAN) injection 4 mg   dextromethorphan-guaiFENesin (MUCINEX DM) 30-600 MG per 12 hr tablet 1 tablet   ceFEPIme (MAXIPIME) 2 g in sodium chloride 0.9 % 100 mL IVPB    Order Specific Question:   Antibiotic Indication:    Answer:   UTI   furosemide (LASIX) injection 40 mg   DISCONTD: potassium chloride SA (KLOR-CON M) CR tablet 40 mEq   feeding supplement (GLUCERNA SHAKE) (GLUCERNA SHAKE) liquid 237 mL   insulin aspart (novoLOG) injection 0-9 Units    Order Specific Question:   Correction coverage:    Answer:   Sensitive (thin, NPO, renal)    Order Specific Question:   CBG < 70:    Answer:   Implement Hypoglycemia Standing Orders and refer to Hypoglycemia Standing Orders sidebar report    Order Specific Question:   CBG 70 - 120:    Answer:   0 units  Order Specific Question:   CBG 121 - 150:    Answer:   1 unit    Order Specific Question:   CBG 151 - 200:    Answer:   2 units    Order Specific Question:   CBG 201 - 250:    Answer:   3 units    Order Specific Question:   CBG 251 - 300:    Answer:   5 units    Order Specific Question:   CBG 301 - 350:    Answer:   7 units    Order Specific Question:   CBG 351 - 400    Answer:   9 units    Order Specific Question:   CBG > 400    Answer:   call MD and obtain STAT lab verification   potassium chloride 10 mEq in 100 mL IVPB   atorvastatin (LIPITOR) tablet 20 mg   carvedilol (COREG) tablet 6.25 mg   DISCONTD: hydrALAZINE (APRESOLINE) tablet 25 mg   pantoprazole (PROTONIX) EC tablet 40 mg   apixaban (ELIQUIS) tablet 2.5 mg   DISCONTD: budesonide (PULMICORT) nebulizer solution 0.25 mg   amLODipine (NORVASC) tablet 10 mg   magnesium sulfate IVPB 2 g 50 mL    potassium chloride 10 mEq in 100 mL IVPB   acidophilus (RISAQUAD) capsule 2 capsule   hydrALAZINE (APRESOLINE) tablet 25 mg   budesonide (PULMICORT) nebulizer solution 0.25 mg    I have reviewed the patients home medicines and have made adjustments as needed  Problem List / ED Course: Problem List Items Addressed This Visit       Other   Hypokalemia   Other Visit Diagnoses     Generalized weakness    -  Primary   Poor fluid intake       Pneumonia of left lower lobe due to infectious organism       Relevant Medications   ceFEPIme (MAXIPIME) 2 g in sodium chloride 0.9 % 100 mL IVPB (Completed)   azithromycin (ZITHROMAX) 500 mg in sodium chloride 0.9 % 250 mL IVPB   dextromethorphan-guaiFENesin (MUCINEX DM) 30-600 MG per 12 hr tablet 1 tablet   ceFEPIme (MAXIPIME) 2 g in sodium chloride 0.9 % 100 mL IVPB   budesonide (PULMICORT) nebulizer solution 0.25 mg   Pleural effusion on left                       This note was created using dictation software, which may contain spelling or grammatical errors.    Loetta Rough, MD 02/20/23 2100

## 2023-02-18 NOTE — ED Notes (Signed)
Unable to get peripheral IV. Charge RN to try for [place] IV.

## 2023-02-18 NOTE — ED Triage Notes (Signed)
Pt from home via [facility] with reports of increased confusion x 4 days. Per Ems pt was recently treated for Uti and finished the course of antibiotics. Pt is awake but does not respond to questions when asked.

## 2023-02-19 DIAGNOSIS — E1165 Type 2 diabetes mellitus with hyperglycemia: Secondary | ICD-10-CM | POA: Diagnosis present

## 2023-02-19 DIAGNOSIS — J9811 Atelectasis: Secondary | ICD-10-CM | POA: Diagnosis present

## 2023-02-19 DIAGNOSIS — I3139 Other pericardial effusion (noninflammatory): Secondary | ICD-10-CM | POA: Diagnosis present

## 2023-02-19 DIAGNOSIS — Z8249 Family history of ischemic heart disease and other diseases of the circulatory system: Secondary | ICD-10-CM | POA: Diagnosis not present

## 2023-02-19 DIAGNOSIS — E782 Mixed hyperlipidemia: Secondary | ICD-10-CM | POA: Diagnosis present

## 2023-02-19 DIAGNOSIS — Z681 Body mass index (BMI) 19 or less, adult: Secondary | ICD-10-CM | POA: Diagnosis not present

## 2023-02-19 DIAGNOSIS — Z794 Long term (current) use of insulin: Secondary | ICD-10-CM | POA: Diagnosis not present

## 2023-02-19 DIAGNOSIS — Z7901 Long term (current) use of anticoagulants: Secondary | ICD-10-CM | POA: Diagnosis not present

## 2023-02-19 DIAGNOSIS — I5032 Chronic diastolic (congestive) heart failure: Secondary | ICD-10-CM | POA: Diagnosis present

## 2023-02-19 DIAGNOSIS — E8809 Other disorders of plasma-protein metabolism, not elsewhere classified: Secondary | ICD-10-CM

## 2023-02-19 DIAGNOSIS — R911 Solitary pulmonary nodule: Secondary | ICD-10-CM

## 2023-02-19 DIAGNOSIS — L89312 Pressure ulcer of right buttock, stage 2: Secondary | ICD-10-CM | POA: Diagnosis present

## 2023-02-19 DIAGNOSIS — E46 Unspecified protein-calorie malnutrition: Secondary | ICD-10-CM

## 2023-02-19 DIAGNOSIS — Y95 Nosocomial condition: Secondary | ICD-10-CM | POA: Diagnosis present

## 2023-02-19 DIAGNOSIS — E1140 Type 2 diabetes mellitus with diabetic neuropathy, unspecified: Secondary | ICD-10-CM | POA: Diagnosis present

## 2023-02-19 DIAGNOSIS — E1151 Type 2 diabetes mellitus with diabetic peripheral angiopathy without gangrene: Secondary | ICD-10-CM | POA: Diagnosis present

## 2023-02-19 DIAGNOSIS — J189 Pneumonia, unspecified organism: Secondary | ICD-10-CM | POA: Insufficient documentation

## 2023-02-19 DIAGNOSIS — J90 Pleural effusion, not elsewhere classified: Secondary | ICD-10-CM | POA: Diagnosis not present

## 2023-02-19 DIAGNOSIS — K219 Gastro-esophageal reflux disease without esophagitis: Secondary | ICD-10-CM | POA: Diagnosis present

## 2023-02-19 DIAGNOSIS — E876 Hypokalemia: Secondary | ICD-10-CM | POA: Diagnosis present

## 2023-02-19 DIAGNOSIS — Z515 Encounter for palliative care: Secondary | ICD-10-CM | POA: Diagnosis not present

## 2023-02-19 DIAGNOSIS — I110 Hypertensive heart disease with heart failure: Secondary | ICD-10-CM | POA: Diagnosis present

## 2023-02-19 DIAGNOSIS — I10 Essential (primary) hypertension: Secondary | ICD-10-CM

## 2023-02-19 DIAGNOSIS — D329 Benign neoplasm of meninges, unspecified: Secondary | ICD-10-CM | POA: Diagnosis present

## 2023-02-19 DIAGNOSIS — G9341 Metabolic encephalopathy: Secondary | ICD-10-CM | POA: Diagnosis present

## 2023-02-19 DIAGNOSIS — J440 Chronic obstructive pulmonary disease with acute lower respiratory infection: Secondary | ICD-10-CM | POA: Diagnosis present

## 2023-02-19 DIAGNOSIS — J449 Chronic obstructive pulmonary disease, unspecified: Secondary | ICD-10-CM | POA: Diagnosis not present

## 2023-02-19 DIAGNOSIS — D649 Anemia, unspecified: Secondary | ICD-10-CM | POA: Diagnosis present

## 2023-02-19 LAB — GLUCOSE, CAPILLARY
Glucose-Capillary: 121 mg/dL — ABNORMAL HIGH (ref 70–99)
Glucose-Capillary: 131 mg/dL — ABNORMAL HIGH (ref 70–99)
Glucose-Capillary: 131 mg/dL — ABNORMAL HIGH (ref 70–99)
Glucose-Capillary: 211 mg/dL — ABNORMAL HIGH (ref 70–99)
Glucose-Capillary: 221 mg/dL — ABNORMAL HIGH (ref 70–99)

## 2023-02-19 LAB — CBC
HCT: 31 % — ABNORMAL LOW (ref 36.0–46.0)
Hemoglobin: 9.2 g/dL — ABNORMAL LOW (ref 12.0–15.0)
MCH: 23.8 pg — ABNORMAL LOW (ref 26.0–34.0)
MCHC: 29.7 g/dL — ABNORMAL LOW (ref 30.0–36.0)
MCV: 80.3 fL (ref 80.0–100.0)
Platelets: 256 10*3/uL (ref 150–400)
RBC: 3.86 MIL/uL — ABNORMAL LOW (ref 3.87–5.11)
RDW: 17.2 % — ABNORMAL HIGH (ref 11.5–15.5)
WBC: 12.7 10*3/uL — ABNORMAL HIGH (ref 4.0–10.5)
nRBC: 0 % (ref 0.0–0.2)

## 2023-02-19 LAB — COMPREHENSIVE METABOLIC PANEL
ALT: 26 U/L (ref 0–44)
AST: 23 U/L (ref 15–41)
Albumin: 2.8 g/dL — ABNORMAL LOW (ref 3.5–5.0)
Alkaline Phosphatase: 44 U/L (ref 38–126)
Anion gap: 8 (ref 5–15)
BUN: 16 mg/dL (ref 8–23)
CO2: 28 mmol/L (ref 22–32)
Calcium: 9.1 mg/dL (ref 8.9–10.3)
Chloride: 107 mmol/L (ref 98–111)
Creatinine, Ser: 0.48 mg/dL (ref 0.44–1.00)
GFR, Estimated: >60 mL/min (ref >60)
Glucose, Bld: 204 mg/dL — ABNORMAL HIGH (ref 70–99)
Potassium: 2.7 mmol/L — CL (ref 3.5–5.1)
Sodium: 143 mmol/L (ref 135–145)
Total Bilirubin: 1.1 mg/dL (ref 0.3–1.2)
Total Protein: 5.6 g/dL — ABNORMAL LOW (ref 6.5–8.1)

## 2023-02-19 LAB — MAGNESIUM: Magnesium: 2.1 mg/dL (ref 1.7–2.4)

## 2023-02-19 LAB — PHOSPHORUS: Phosphorus: 1.8 mg/dL — ABNORMAL LOW (ref 2.5–4.6)

## 2023-02-19 LAB — PROCALCITONIN: Procalcitonin: <0.1 ng/mL

## 2023-02-19 MED ORDER — ONDANSETRON HCL 4 MG PO TABS: 4.0000 mg | ORAL_TABLET | Freq: Four times a day (QID) | ORAL | Status: DC | PRN

## 2023-02-19 MED ORDER — PANTOPRAZOLE SODIUM 40 MG PO TBEC
40.0000 mg | DELAYED_RELEASE_TABLET | Freq: Every day | ORAL | Status: DC
  Administered 2023-02-19 – 2023-02-21 (×3): 40 mg via ORAL
  Filled 2023-02-19 (×3): qty 1

## 2023-02-19 MED ORDER — ATORVASTATIN CALCIUM 20 MG PO TABS
20.0000 mg | ORAL_TABLET | Freq: Every day | ORAL | Status: DC
  Administered 2023-02-19 – 2023-02-21 (×3): 20 mg via ORAL
  Filled 2023-02-19 (×3): qty 1

## 2023-02-19 MED ORDER — MAGNESIUM SULFATE 2 GM/50ML IV SOLN
2.0000 g | Freq: Once | INTRAVENOUS | Status: AC
  Administered 2023-02-19: 2 g via INTRAVENOUS
  Filled 2023-02-19: qty 50

## 2023-02-19 MED ORDER — GLUCERNA SHAKE PO LIQD
237.0000 mL | Freq: Three times a day (TID) | ORAL | Status: DC
  Administered 2023-02-19 – 2023-02-20 (×4): 237 mL via ORAL

## 2023-02-19 MED ORDER — INSULIN ASPART 100 UNIT/ML IJ SOLN
0.0000 [IU] | INTRAMUSCULAR | Status: DC
  Administered 2023-02-19 (×2): 1 [IU] via SUBCUTANEOUS
  Administered 2023-02-19: 3 [IU] via SUBCUTANEOUS
  Administered 2023-02-19: 1 [IU] via SUBCUTANEOUS
  Administered 2023-02-19 – 2023-02-20 (×2): 3 [IU] via SUBCUTANEOUS
  Administered 2023-02-21 (×3): 1 [IU] via SUBCUTANEOUS

## 2023-02-19 MED ORDER — FUROSEMIDE 10 MG/ML IJ SOLN
40.0000 mg | Freq: Once | INTRAMUSCULAR | Status: AC
  Administered 2023-02-19: 40 mg via INTRAVENOUS
  Filled 2023-02-19: qty 4

## 2023-02-19 MED ORDER — APIXABAN 2.5 MG PO TABS
2.5000 mg | ORAL_TABLET | Freq: Two times a day (BID) | ORAL | Status: DC
  Administered 2023-02-19 – 2023-02-21 (×5): 2.5 mg via ORAL
  Filled 2023-02-19 (×5): qty 1

## 2023-02-19 MED ORDER — AMLODIPINE BESYLATE 5 MG PO TABS
10.0000 mg | ORAL_TABLET | Freq: Every day | ORAL | Status: DC
  Administered 2023-02-19 – 2023-02-21 (×3): 10 mg via ORAL
  Filled 2023-02-19 (×3): qty 2

## 2023-02-19 MED ORDER — ACETAMINOPHEN 650 MG RE SUPP: 650.0000 mg | Freq: Four times a day (QID) | RECTAL | Status: DC | PRN

## 2023-02-19 MED ORDER — CARVEDILOL 3.125 MG PO TABS
6.2500 mg | ORAL_TABLET | Freq: Two times a day (BID) | ORAL | Status: DC
  Administered 2023-02-19 – 2023-02-21 (×4): 6.25 mg via ORAL
  Filled 2023-02-19 (×4): qty 2

## 2023-02-19 MED ORDER — HYDRALAZINE HCL 25 MG PO TABS
25.0000 mg | ORAL_TABLET | Freq: Three times a day (TID) | ORAL | Status: DC
  Administered 2023-02-19 – 2023-02-20 (×3): 25 mg via ORAL
  Filled 2023-02-19 (×4): qty 1

## 2023-02-19 MED ORDER — BUDESONIDE 0.25 MG/2ML IN SUSP
0.2500 mg | Freq: Two times a day (BID) | RESPIRATORY_TRACT | Status: DC
  Administered 2023-02-19: 0.25 mg via RESPIRATORY_TRACT
  Filled 2023-02-19 (×2): qty 2

## 2023-02-19 MED ORDER — DM-GUAIFENESIN ER 30-600 MG PO TB12
1.0000 | ORAL_TABLET | Freq: Two times a day (BID) | ORAL | Status: DC
  Administered 2023-02-19 – 2023-02-21 (×4): 1 via ORAL
  Filled 2023-02-19 (×5): qty 1

## 2023-02-19 MED ORDER — ENOXAPARIN SODIUM 40 MG/0.4ML IJ SOSY: 40.0000 mg | PREFILLED_SYRINGE | INTRAMUSCULAR | Status: DC

## 2023-02-19 MED ORDER — ACETAMINOPHEN 325 MG PO TABS: 650.0000 mg | ORAL_TABLET | Freq: Four times a day (QID) | ORAL | Status: DC | PRN

## 2023-02-19 MED ORDER — POTASSIUM CHLORIDE 10 MEQ/100ML IV SOLN
10.0000 meq | INTRAVENOUS | Status: AC
  Administered 2023-02-19 (×4): 10 meq via INTRAVENOUS
  Filled 2023-02-19 (×4): qty 100

## 2023-02-19 MED ORDER — ONDANSETRON HCL 4 MG/2ML IJ SOLN: 4.0000 mg | Freq: Four times a day (QID) | INTRAMUSCULAR | Status: DC | PRN

## 2023-02-19 MED ORDER — POTASSIUM CHLORIDE CRYS ER 20 MEQ PO TBCR: 40.0000 meq | EXTENDED_RELEASE_TABLET | Freq: Once | ORAL | Status: DC

## 2023-02-19 MED ORDER — SODIUM CHLORIDE 0.9 % IV SOLN
2.0000 g | Freq: Two times a day (BID) | INTRAVENOUS | Status: DC
  Administered 2023-02-19 – 2023-02-20 (×4): 2 g via INTRAVENOUS
  Filled 2023-02-19 (×5): qty 12.5

## 2023-02-19 NOTE — H&P (Signed)
History and Physical    Patient: [name] DOB: [birth year] DOA: 02/18/2023 DOS: the patient was seen and examined on 02/19/2023 PCP: Abram Sander, MD  Patient coming from: Home  Chief Complaint:  Chief Complaint  Patient presents with   Altered Mental Status   HPI: [name] is a 82 y.o. female with medical history significant of hypertension, hyperlipidemia, type 2 diabetes mellitus, chronic diastolic heart failure, COPD and PAD with claudication s/p right lower extremity angioplasty on 6/25 who presented to the emergency department from home via EMS due to decreased oral intake secondary to Thrush.  Family was concerned of dehydration and UTI since patient has not been urinating much within the last 2 days.  Home nurses were concerned due to right hand becoming more crippled and with concern for stroke. Patient was recently admitted from 7/1 to 7/5 due to acute metabolic encephalopathy likely secondary to UTI (E. coli UTI).  Son states that patient has not been back to baseline since discharge from the hospital.  She was normally alert and talkative, but has been withdrawn since return from the hospital.  Patient was asked to spit out water/food when she is fed for 1 week.  ED Course:  In the emergency department, BP was 167/68, other vital signs are within normal range.  Workup in the ED showed normocytic anemia, BMP was normal except for potassium of 2.9 and blood glucose 266, albumin 2.9, magnesium 2.3. Chest x-ray showed small to moderate left pleural effusion with associated left basilar atelectasis or infiltrate.  Small right pleural effusion.  16 mm nodule within the right midlung zone which demonstrates slow but progressive enlargement since remote prior CT examination is suspicious for low-grade malignancy. MRI head without contrast showed no acute intracranial abnormality CTA head without contrast showed no acute intracranial abnormality.  Unchanged 9 mm  calcified lesion along the right cerebral convexity likely meningioma.  Review of Systems: Review of systems as noted in the HPI. All other systems reviewed and are negative.   Past Medical History:  Diagnosis Date   Acute heart failure (HCC)    Aortic stenosis    CHF (congestive heart failure) (HCC)    Complication of anesthesia    Hard to wake patient up after having anesthesia   Diabetes mellitus without complication (HCC)    Diabetic neuropathy (HCC)    Takes Lyrica   Edema of both legs    Takes Lasix   GERD (gastroesophageal reflux disease)    High cholesterol    HTN (hypertension)    Hypertension    PAD (peripheral artery disease) (HCC)    Shortness of breath dyspnea    Spasm of back muscles    Stroke (HCC)    Wound, open    Right great toe   Past Surgical History:  Procedure Laterality Date   AMPUTATION TOE Left 06/09/2019   Procedure: Left third toe and partial great toe amputation and debridement;  Surgeon: Renford Dills, MD;  Location: [facility];  Service: Vascular;  Laterality: Left;   AMPUTATION TOE Left 04/06/2020   Procedure: AMPUTATION LEFT SECOND TOE;  Surgeon: Gwyneth Revels, DPM;  Location: [facility];  Service: Podiatry;  Laterality: Left;   BYPASS GRAFT POPLITEAL TO TIBIAL Right 02/28/2016   Procedure: BYPASS GRAFT RIGHT BELOW KNEE POPLITEAL TO PERONEAL USING REVERSED RIGHT GREATER SAPHENOUS VEIN;  Surgeon: Fransisco Hertz, MD;  Location: [facility];  Service: Vascular;  Laterality: Right;   CYST EXCISION  Abdomen   EYE SURGERY Bilateral    Cataract removal   INTRAMEDULLARY (IM) NAIL INTERTROCHANTERIC Left 02/04/2014   Procedure: INTRAMEDULLARY (IM) NAIL INTERTROCHANTRIC FEMORAL;  Surgeon: Shelda Pal, MD;  Location: [facility];  Service: Orthopedics;  Laterality: Left;   IR GENERIC HISTORICAL  03/01/2016   IR ANGIO INTRA EXTRACRAN SEL COM CAROTID INNOMINATE UNI R MOD SED 03/01/2016 Julieanne Cotton, MD [facility]   IR GENERIC HISTORICAL  03/01/2016   IR  ENDOVASC INTRACRANIAL INF OTHER THAN THROMBO ART INC DIAG ANGIO 03/01/2016 Julieanne Cotton, MD [facility]   IR GENERIC HISTORICAL  03/01/2016   IR INTRAVSC STENT CERV CAROTID W/O EMB-PROT MOD SED INC ANGIO 03/01/2016 Julieanne Cotton, MD [facility]   IR GENERIC HISTORICAL  06/03/2016   IR RADIOLOGIST EVAL & MGMT 06/03/2016 [facility]   LOWER EXTREMITY ANGIOGRAPHY Left 02/09/2019   Procedure: LOWER EXTREMITY ANGIOGRAPHY;  Surgeon: Renford Dills, MD;  Location: [facility];  Service: Cardiovascular;  Laterality: Left;   LOWER EXTREMITY ANGIOGRAPHY Left 03/10/2019   Procedure: LOWER EXTREMITY ANGIOGRAPHY;  Surgeon: Renford Dills, MD;  Location: [facility];  Service: Cardiovascular;  Laterality: Left;   LOWER EXTREMITY ANGIOGRAPHY Left 04/27/2019   Procedure: LOWER EXTREMITY ANGIOGRAPHY;  Surgeon: Renford Dills, MD;  Location: [facility];  Service: Cardiovascular;  Laterality: Left;   LOWER EXTREMITY ANGIOGRAPHY Left 06/08/2019   Procedure: Lower Extremity Angiography;  Surgeon: Renford Dills, MD;  Location: [facility];  Service: Cardiovascular;  Laterality: Left;   LOWER EXTREMITY ANGIOGRAPHY Left 04/03/2020   Procedure: Lower Extremity Angiography;  Surgeon: Annice Needy, MD;  Location: [facility];  Service: Cardiovascular;  Laterality: Left;   LOWER EXTREMITY ANGIOGRAPHY Right 10/20/2020   Procedure: Lower Extremity Angiography;  Surgeon: Renford Dills, MD;  Location: [facility];  Service: Cardiovascular;  Laterality: Right;   LOWER EXTREMITY ANGIOGRAPHY Left 01/23/2021   Procedure: LOWER EXTREMITY ANGIOGRAPHY;  Surgeon: Renford Dills, MD;  Location: [facility];  Service: Cardiovascular;  Laterality: Left;   LOWER EXTREMITY ANGIOGRAPHY Right 04/10/2021   Procedure: LOWER EXTREMITY ANGIOGRAPHY;  Surgeon: Renford Dills, MD;  Location: [facility];  Service: Cardiovascular;  Laterality:  Right;   LOWER EXTREMITY ANGIOGRAPHY Right 01/28/2023   Procedure: Lower Extremity Angiography;  Surgeon: Renford Dills, MD;  Location: [facility];  Service: Cardiovascular;  Laterality: Right;   ORIF TOE FRACTURE Right 02/08/2014   Procedure: OPEN REDUCTION INTERNAL FIXATION Right METATARSAL  FRACTURE ;  Surgeon: Toni Arthurs, MD;  Location[facility];  Service: Orthopedics;  Laterality: Right;   PERIPHERAL VASCULAR CATHETERIZATION N/A 12/28/2015   Procedure: Abdominal Aortogram w/Lower Extremity;  Surgeon: Fransisco Hertz, MD;  Location: [facility];  Service: Cardiovascular;  Laterality: N/A;   RADIOLOGY WITH ANESTHESIA N/A 03/01/2016   Procedure: RADIOLOGY WITH ANESTHESIA;  Surgeon: Julieanne Cotton, MD;  Location: [facility];  Service: Radiology;  Laterality: N/A;   VEIN HARVEST Right 02/28/2016   Procedure: RIGHT GREATER SAPHENOUS VEIN HARVEST;  Surgeon: Fransisco Hertz, MD;  Location: [facility];  Service: Vascular;  Laterality: Right;    Social History:  reports that she has never smoked. She has never used smokeless tobacco. She reports that she does not drink alcohol and does not use drugs.   Allergies  Allergen Reactions   Egg-Derived Products Shortness Of Breath   Iodine Anaphylaxis   Penicillins Anaphylaxis    Tolerates  ceftriaxone, cefazolin Did it involve swelling of the face/tongue/throat, SOB, or low BP? Unknown Did it involve sudden or severe rash/hives, skin peeling, or any reaction on the inside of your mouth or nose? Unknown Did you need to seek medical attention at a hospital or doctor's office? Unknown When did it last happen?      Unknown If all above answers are "NO", may proceed with cephalosporin use.    Shellfish Allergy Anaphylaxis   Sulfa Antibiotics Anaphylaxis   Sulfacetamide Sodium Anaphylaxis   Sulfasalazine Anaphylaxis   Morphine And Codeine Other (See Comments)    Altered mental status    Family History  Problem Relation Age of Onset   Diabetes  Other    Liver disease Mother    CVA Father    Diabetes Father    Hypertension Father      Prior to Admission medications   Medication Sig Start Date End Date Taking? Authorizing Provider  acetaminophen (TYLENOL) 500 MG tablet Take 1,000 mg by mouth every 6 (six) hours as needed for mild pain.    [provider]  amLODipine (NORVASC) 10 MG tablet Take 10 mg by mouth daily.    [provider]  apixaban (ELIQUIS) 2.5 MG TABS tablet Take 1 tablet (2.5 mg total) by mouth 2 (two) times daily. 01/28/23   Schnier, Latina Craver, MD  aspirin EC 81 MG EC tablet Take 1 tablet (81 mg total) by mouth daily. 04/29/19   Stegmayer, Cala Bradford A, PA-C  atorvastatin (LIPITOR) 20 MG tablet Take 1 tablet (20 mg total) by mouth daily. Patient taking differently: Take 20 mg by mouth at bedtime. 03/13/16   Maris Berger A, PA-C  budesonide (PULMICORT) 0.25 MG/2ML nebulizer solution Take 2 mLs (0.25 mg total) by nebulization 2 (two) times daily. 06/10/18   Auburn Bilberry, MD  carbamide peroxide (DEBROX) 6.5 % OTIC solution Place 5 drops into both ears 2 (two) times daily as needed (ear wax).    [provider]  Carboxymethylcellul-Glycerin (LUBRICATING EYE DROPS OP) Place 1 drop into both eyes daily as needed (dry eyes).    [provider]  carvedilol (COREG) 6.25 MG tablet Take 6.25 mg by mouth 2 (two) times daily with a meal.    [provider]  cetirizine (ZYRTEC) 10 MG tablet Take 10 mg by mouth daily.    [provider]  cholecalciferol (VITAMIN D3) 25 MCG (1000 UNIT) tablet Take 1,000 Units by mouth daily.    [provider]  clopidogrel (PLAVIX) 75 MG tablet Take 1 tablet (75 mg total) by mouth daily with breakfast. 03/04/17   Fransisco Hertz, MD  diclofenac Sodium (VOLTAREN) 1 % GEL Apply 1 application topically 4 (four) times daily as needed (pain).    [provider]  docusate sodium (COLACE) 100 MG capsule Take 100 mg by mouth daily.     [provider]  fluticasone (FLONASE) 50 MCG/ACT nasal spray Place 1 spray into both nostrils daily as needed for allergies.     [provider]  gabapentin (NEURONTIN) 300 MG capsule Take 300 mg by mouth at bedtime.    [provider]  hydrALAZINE (APRESOLINE) 25 MG tablet Take 1 tablet (25 mg total) by mouth every 8 (eight) hours. 12/28/18   Enedina Finner, MD  insulin glargine (LANTUS) 100 UNIT/ML injection Inject 0.1 mLs (10 Units total) into the skin at bedtime. Patient taking differently: Inject 18 Units into the skin at bedtime. 03/10/21   Erick Blinks, MD  magnesium hydroxide (  MILK OF MAGNESIA) 400 MG/5ML suspension Take 15 mLs by mouth daily as needed for mild constipation.    [provider]  magnesium oxide (MAG-OX) 400 MG tablet Take 400 mg by mouth 2 (two) times daily.     [provider]  modafinil (PROVIGIL) 200 MG tablet Take 200 mg by mouth daily.    [provider]  [facility] powder Apply 1 application topically daily. 03/05/21   [provider]  omeprazole (PRILOSEC) 20 MG capsule Take 1 capsule (20 mg total) by mouth daily. 04/17/18   Houston Siren, MD  polyethylene glycol (MIRALAX / GLYCOLAX) packet Take 17 g by mouth 2 (two) times daily. Patient taking differently: Take 17 g by mouth daily as needed for moderate constipation. 02/09/14   Leroy Sea, MD  potassium chloride SA (K-DUR,KLOR-CON) 20 MEQ tablet Take 20 mEq by mouth daily.    [provider]  torsemide (DEMADEX) 20 MG tablet Take 1 tablet (20 mg total) by mouth 2 (two) times daily. 12/28/18   Enedina Finner, MD  Vitamin E 180 MG CAPS Take 180 mg by mouth daily.    [provider]    Physical Exam: BP (!) 162/54 (BP Location: Right Arm)   Pulse 75   Temp 97.9 F (36.6 C) (Oral)   Resp 20   SpO2 96%   General: 82 y.o. year-old female well developed well nourished in no acute distress.  Alert and oriented x3. HEENT: NCAT, EOMI Neck:  Supple, trachea medial Cardiovascular: Regular rate and rhythm with no rubs or gallops.  No thyromegaly or JVD noted.  No lower extremity edema. 2/4 pulses in all 4 extremities. Respiratory: Clear to auscultation with no wheezes or rales. Good inspiratory effort. Abdomen: Soft, nontender nondistended with normal bowel sounds x4 quadrants. Muskuloskeletal: No cyanosis, clubbing or edema noted bilaterally Neuro: CN II-XII intact, strength 5/5 x 4, sensation, reflexes intact Skin: No ulcerative lesions noted or rashes Psychiatry:Mood is appropriate for condition and setting          Labs on Admission:  Basic Metabolic Panel: Recent Labs  Lab 02/18/23 1558  NA 143  K 2.9*  CL 106  CO2 29  GLUCOSE 266*  BUN 19  CREATININE 0.60  CALCIUM 9.2  MG 2.3   Liver Function Tests: Recent Labs  Lab 02/18/23 1558  AST 25  ALT 30  ALKPHOS 45  BILITOT 0.8  PROT 5.8*  ALBUMIN 2.9*   No results for input(s): "LIPASE", "AMYLASE" in the last 168 hours. No results for input(s): "AMMONIA" in the last 168 hours. CBC: Recent Labs  Lab 02/18/23 1558  WBC 10.1  NEUTROABS 8.2*  HGB 9.3*  HCT 31.1*  MCV 80.2  PLT 360   Cardiac Enzymes: No results for input(s): "CKTOTAL", "CKMB", "CKMBINDEX", "TROPONINI" in the last 168 hours.  BNP (last 3 results) Recent Labs    02/03/23 1233  BNP 699.0*    ProBNP (last 3 results) No results for input(s): "PROBNP" in the last 8760 hours.  CBG: Recent Labs  Lab 02/18/23 1545  GLUCAP 248*    Radiological Exams on Admission: DG Chest Portable 1 View  Result Date: 02/18/2023 CLINICAL DATA:  Altered mental status, tachypnea EXAM: PORTABLE CHEST 1 VIEW COMPARISON:  02/03/2023, CT 06/03/2018 FINDINGS: Retrocardiac opacification persists, likely related to a small to moderate left pleural effusion with associated left basilar atelectasis or infiltrate. Small right pleural effusion is unchanged. Pulmonary insufflation is stable. 16 mm nodule is seen  within the a  right mid lung zone which demonstrates slow but progressive enlargement since remote prior CT examination is suspicious for a low-grade malignancy. No pneumothorax. Stable cardiomegaly. Pulmonary vascularity is normal. No acute bone abnormality. IMPRESSION: 1. Small to moderate left pleural effusion with associated left basilar atelectasis or infiltrate. 2. Small right pleural effusion. 3. 16 mm nodule within the right mid lung zone which demonstrates slow but progressive enlargement since remote prior CT examination is suspicious for a low-grade malignancy. Dedicated nonemergent contrast enhanced CT examination is recommended for further characterization once the patient's acute clinical issues have resolved. Electronically Signed   By: Helyn Numbers M.D.   On: 02/18/2023 21:46   MR BRAIN WO CONTRAST  Result Date: 02/18/2023 CLINICAL DATA:  Altered mental status EXAM: MRI HEAD WITHOUT CONTRAST TECHNIQUE: Multiplanar, multiecho pulse sequences of the brain and surrounding structures were obtained without intravenous contrast. COMPARISON:  None Available. FINDINGS: Brain: No acute infarct, mass effect or extra-axial collection. Remote microhemorrhage in the right cerebellum. There is multifocal hyperintense T2-weighted signal within the white matter. Generalized volume loss. 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 Old left basal ganglia small vessel infarct. The midline structures are normal. Vascular: Major flow voids are preserved. Skull and upper cervical spine: Normal calvarium and skull base. Visualized upper cervical spine and soft tissues are normal. Sinuses/Orbits:No paranasal sinus fluid levels or advanced mucosal thickening. No mastoid or middle ear effusion. Normal orbits. IMPRESSION: 1. No acute intracranial abnormality. 2. 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 Electronically Signed   By: Deatra Robinson  M.D.   On: 02/18/2023 20:18   CT Head Wo Contrast  Result Date: 02/18/2023 CLINICAL DATA:  Mental status change, unknown cause EXAM: CT HEAD WITHOUT CONTRAST TECHNIQUE: Contiguous axial images were obtained from the base of the skull through the vertex without intravenous contrast. RADIATION DOSE REDUCTION: This exam was performed according to the departmental dose-optimization program which includes automated exposure control, adjustment of the mA and/or kV according to patient size and/or use of iterative reconstruction technique. COMPARISON:  CT Head 02/03/23 FINDINGS: Brain: Sequela of severe chronic microvascular ischemic change. No hydrocephalus. No extra-axial fluid collection. Redemonstrated chronic right parietal lobe infarct. There is a 9 x 8 mm calcified lesion along the right cerebellar convexity, unchanged from prior exam, likely a meningioma. Vascular: No hyperdense vessel or unexpected calcification. Skull: Normal. Negative for fracture or focal lesion. Sinuses/Orbits: No middle ear or mastoid effusion. Paranasal sinuses are clear. Bilateral lens replacement. Orbits are otherwise unremarkable. Other: None. IMPRESSION: 1. No acute intracranial abnormality. 2. Sequela of severe chronic microvascular ischemic change with a few chronic infarcts. 3. Unchanged 9 mm calcified lesion along the right cerebellar convexity, likely a meningioma. Electronically Signed   By: Lorenza Cambridge M.D.   On: 02/18/2023 18:11    EKG: I independently viewed the EKG done and my findings are as followed: Normal sinus rhythm at a rate of 84 bpm  Assessment/Plan Present on Admission:  Acute metabolic encephalopathy  Hypoalbuminemia due to protein-calorie malnutrition (HCC)  Type 2 diabetes mellitus with hyperglycemia (HCC)  Mixed hyperlipidemia  COPD (chronic obstructive pulmonary disease) (HCC)  Chronic diastolic CHF (congestive heart failure) (HCC)  GERD without esophagitis  Active Problems:   Mixed  hyperlipidemia   Chronic diastolic CHF (congestive heart failure) (HCC)   COPD (chronic obstructive pulmonary disease) (HCC)   GERD without esophagitis   Hypoalbuminemia due to protein-calorie malnutrition (HCC)   Type 2  diabetes mellitus with hyperglycemia (HCC)   Acute metabolic encephalopathy   HCAP (healthcare-associated pneumonia)   Bilateral pleural effusion   Hypokalemia   Pulmonary nodule  Acute metabolic encephalopathy possibly due to multifactorial This may be due to multifactorial including possible HCAP, deconditioning, failure to thrive in adult There was no report of polypharmacy Continue fall precaution  Possible HCAP POA Patient was started on cefepime and azithromycin, we shall continue same at this time with plan to de-escalate/discontinue based on blood culture, sputum culture, urine Legionella, strep pneumo and procalcitonin Continue Tylenol as needed Continue Mucinex, incentive spirometry, flutter valve   Bilateral pleural effusion Chest x-ray showed small to moderate left pleural effusion with associated left basilar atelectasis or infiltrate.  Small right pleural effusion. Lasix 40 mg x 1 will be given  Hypokalemia K+ 2.9, this will be replenished  Hypoalbuminemia possibly secondary to moderate protein calorie malnutrition Albumin 2.9, protein supplement will be provided  Pulmonary nodule Chest x-ray showed 16 mm nodule within the right midlung zone which demonstrates slow but progressive enlargement since remote prior CT examination is suspicious for low-grade malignancy. 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  No RWMA.  G1 DD Continue home meds  GERD Continue Protonix   DVT prophylaxis: Eliquis  Advance Care Planning: Full code  Consults: None  Family Communication: None at bedside  Severity of Illness: The appropriate patient status for this patient is INPATIENT. Inpatient status is judged to be reasonable and necessary in order to provide the required intensity of service to ensure the patient's safety. The patient's presenting symptoms, physical exam findings, and initial radiographic and laboratory data in the context of their chronic comorbidities is felt to place them at high risk for further clinical deterioration. Furthermore, it is not anticipated that the patient will be medically stable for discharge from the hospital within 2 midnights of admission.   * I certify that at the point of admission it is my clinical judgment that the patient will require inpatient hospital care spanning beyond 2 midnights from the point of admission due to high intensity of service, high risk for further deterioration and high frequency of surveillance required.*  Author: Frankey Shown, DO 02/19/2023 4:28 AM  For on call review [email or web address].

## 2023-02-19 NOTE — Progress Notes (Signed)
Pharmacy Antibiotic Note  [name] is a 82 y.o. female admitted on 02/18/2023 with  PNA/UTI .  Pharmacy has been consulted for Cefepime dosing. WBC 10.1. Scr 0.6. Recent EColi UTI.   Plan: Cefepime 2g IV q12h F/U infectious work-up  Temp (24hrs), Avg:98.1 F (36.7 C), Min:97.4 F (36.3 C), Max:98.6 F (37 C)  Recent Labs  Lab 02/18/23 1558  WBC 10.1  CREATININE 0.60    CrCl cannot be calculated (Unknown ideal weight.).    Allergies  Allergen Reactions   Egg-Derived Products Shortness Of Breath   Iodine Anaphylaxis   Penicillins Anaphylaxis    Tolerates ceftriaxone, cefazolin Did it involve swelling of the face/tongue/throat, SOB, or low BP? Unknown Did it involve sudden or severe rash/hives, skin peeling, or any reaction on the inside of your mouth or nose? Unknown Did you need to seek medical attention at a hospital or doctor's office? Unknown When did it last happen?      Unknown If all above answers are "NO", may proceed with cephalosporin use.    Shellfish Allergy Anaphylaxis   Sulfa Antibiotics Anaphylaxis   Sulfacetamide Sodium Anaphylaxis   Sulfasalazine Anaphylaxis   Morphine And Codeine Other (See Comments)    Altered mental status    Abran Duke, PharmD, BCPS Clinical Pharmacist Phone: [phone]

## 2023-02-19 NOTE — Progress Notes (Signed)
PROGRESS NOTE    Patient: [name]                            PCP: Abram Sander, MD                    DOB: 1940/09/26            DOA: 02/18/2023 ZOX:[id number]             DOS: 02/19/2023, 11:31 AM   LOS: 0 days   Date of Service: The patient was seen and examined on 02/19/2023  Subjective:   The patient was seen and examined this morning. Hemodynamically stable. Patient remained calm but confused laying in bed comfortably, limited physical exam possible asymmetric weaknesses..  CTA and MRI of the head was negative  Brief Narrative:   LAVIDA PATCH is a 82 y.o. female with medical history significant of hypertension, hyperlipidemia, type 2 diabetes mellitus, chronic diastolic heart failure, COPD and PAD with claudication s/p right lower extremity angioplasty on 6/25 who presented to the emergency department from home via EMS due to decreased oral intake secondary to Thrush.   Family was concerned of dehydration and UTI since patient has not been urinating much within the last 2 days.  Home nurses were concerned due to right hand becoming more crippled and with concern for stroke. Patient was recently admitted from 7/1 to 7/5 due to acute metabolic encephalopathy likely secondary to UTI (E. coli UTI).  Son states that patient has not been back to baseline since discharge from the hospital.  She was normally alert and talkative, but has been withdrawn since return from the hospital.  Patient was asked to spit out water/food when she is fed for 1 week.   ED Course:  In the emergency department, BP was 167/68, other vital signs are within normal range.  Workup in the ED showed normocytic anemia, BMP was normal except for potassium of 2.9 and blood glucose 266, albumin 2.9, magnesium 2.3. Chest x-ray showed small to moderate left pleural effusion with associated left basilar atelectasis or infiltrate.  Small right pleural effusion.  16 mm nodule within the right midlung zone which  demonstrates slow but progressive enlargement since remote prior CT examination is suspicious for low-grade malignancy. MRI head without contrast showed no acute intracranial abnormality CTA head without contrast showed no acute intracranial abnormality.  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     Assessment & Plan:   Active Problems:   Mixed hyperlipidemia   Chronic diastolic CHF (congestive heart failure) (HCC)   COPD (chronic obstructive pulmonary disease) (HCC)   GERD without esophagitis   Hypoalbuminemia due to protein-calorie malnutrition (HCC)   Type 2 diabetes mellitus with hyperglycemia (HCC)   Acute metabolic encephalopathy   HCAP (healthcare-associated pneumonia)   Bilateral pleural effusion   Hypokalemia   Pulmonary nodule   Present on Admission:  Acute metabolic encephalopathy  Hypoalbuminemia due to protein-calorie malnutrition (HCC)  Type 2 diabetes mellitus with hyperglycemia (HCC)  Mixed hyperlipidemia  COPD (chronic obstructive pulmonary disease) (HCC)  Chronic diastolic CHF (congestive heart failure) (HCC)  GERD without esophagitis   Assessment/Plan  Acute metabolic encephalopathy possibly due to multifactorial This may be due to multifactorial including possible HCAP, deconditioning, failure to thrive in adult There was no report of polypharmacy Continue fall precaution   Possible HCAP POA -Continue IV cefepime and azithromycin, will  de-escalate/discontinue based on blood culture, sputum culture, urine Legionella, strep pneumo and procalcitonin Continue Tylenol as needed Continue Mucinex, incentive spirometry, flutter valve    Bilateral pleural effusion Chest x-ray showed small to moderate left pleural effusion with associated left basilar atelectasis or infiltrate.  Small right pleural effusion. Lasix 40 mg x 1 will be given   Hypokalemia -Monitoring and replenished   Hypoalbuminemia possibly  secondary to moderate protein calorie malnutrition Albumin 2.9, protein supplement will be provided   Pulmonary nodule Chest x-ray showed 16 mm nodule within the right midlung zone which demonstrates slow but progressive enlargement since remote prior CT examination is suspicious for low-grade malignancy. 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   No RWMA.  G1 DD Continue home meds   GERD Continue Protonix    --------------------------------------------------------------------------------------------------------------------------------- Nutritional status:  The patient's BMI is: Body mass index is 19.57 kg/m. I agree with the assessment and plan as outlined   Skin Assessment: I have examined the patient's skin and I agree with the wound assessment as performed by wound care team As outlined belowe: Pressure Injury 04/01/20 Buttocks Right;Left Stage II -  Partial thickness loss of dermis presenting as a shallow open ulcer with a red, pink wound bed without slough. stage 2 and MASD (Active)  04/01/20 2045  Location: Buttocks  Location Orientation: Right;Left  Staging: Stage II -  Partial thickness loss of dermis presenting as a shallow open ulcer with a red, pink wound bed without slough.  Wound Description (Comments): stage 2 and MASD  Present on Admission: Yes      ---------------------------------------------------------------------------------------------------------------------------------------------------- Cultures; Blood Cultures x 2 >>  Urine Culture  >>>  Sputum Culture >>    ------------------------------------------------------------------------------------------------------------------------------------------------  DVT prophylaxis:  apixaban (ELIQUIS) tablet 2.5 mg Start: 02/19/23 1030 SCDs Start: 02/19/23 0402 apixaban (ELIQUIS) tablet 2.5 mg   Code Status:   Code Status: Full Code  Family Communication: No family member present at bedside- attempt will be made to update daily The above findings and plan of care has been discussed with patient (and family)  in detail,  they expressed understanding and agreement of above. -Advance care planning has been discussed.   Admission status:   Status is: Inpatient Remains inpatient appropriate because: Evaluation for PT, treating with IV antibiotics, investigate underlying cause   Disposition: From  - home             Planning for discharge in 1-2 days: to   Procedures:   No admission procedures for hospital encounter.   Antimicrobials:  Anti-infectives (From admission, onward)    Start     Dose/Rate Route Frequency Ordered Stop   02/19/23 1000  ceFEPIme (MAXIPIME) 2 g in sodium chloride 0.9 % 100 mL IVPB        2 g 200 mL/hr over 30 Minutes Intravenous Every 12 hours 02/19/23 0406     02/18/23 2220  sodium chloride 0.9 % with ceFEPIme (MAXIPIME) ADS Med       Note to Pharmacy: Grant Fontana : cabinet override      02/18/23 2220 02/19/23 1044  02/18/23 2215  ceFEPIme (MAXIPIME) 2 g in sodium chloride 0.9 % 100 mL IVPB        2 g 200 mL/hr over 30 Minutes Intravenous  Once 02/18/23 2203 02/18/23 2302   02/18/23 2215  azithromycin (ZITHROMAX) 500 mg in sodium chloride 0.9 % 250 mL IVPB        500 mg 250 mL/hr over 60 Minutes Intravenous Every 24 hours 02/18/23  2203          Medication:   amLODipine  10 mg Oral Daily   apixaban  2.5 mg Oral BID   atorvastatin  20 mg Oral Daily   budesonide  0.25 mg Nebulization BID   carvedilol  6.25 mg Oral BID WC   dextromethorphan-guaiFENesin  1 tablet Oral BID   feeding supplement (GLUCERNA SHAKE)  237 mL Oral TID BM   hydrALAZINE  25 mg Oral Q8H   insulin aspart  0-9 Units Subcutaneous Q4H   pantoprazole  40 mg Oral QAC breakfast    acetaminophen **OR** acetaminophen, ondansetron **OR** ondansetron (ZOFRAN) IV   Objective:   Vitals:   02/19/23 0100 02/19/23 0224 02/19/23 0325 02/19/23 1000  BP: 123/65 119/60 (!) 162/54   Pulse: 77 66 75   Resp: 20 (!) 21 20   Temp:  (!) 97.4 F (36.3 C) 97.9 F (36.6 C)   TempSrc:  Axillary Oral   SpO2: 100% 100% 96%   Weight:    60.1 kg    Intake/Output Summary (Last 24 hours) at 02/19/2023 1131 Last data filed at 02/19/2023 0981 Gross per 24 hour  Intake 360 ml  Output 400 ml  Net -40 ml   Filed Weights   02/19/23 1000  Weight: 60.1 kg     Physical examination:   Constitution: Awake, confused HEENT:        Normocephalic, PERRL, otherwise with in Normal limits  Chest:         Chest symmetric Cardio vascular:  S1/S2, RRR, No murmure, No Rubs or Gallops  pulmonary: Clear to auscultation bilaterally, respirations unlabored, negative wheezes / crackles Abdomen: Soft, non-tender, non-distended, bowel sounds,no masses, no organomegaly Muscular skeletal: Limited exam -not cooperative with exam, asymmetric weakness is noted right greater than left especially in upper extremity  Otherwise in bed, able to move all 4 extremities,   Neuro: Possibly Citramag weakness, right greater than left -exam patient is not cooperative, confused  CNII-XII intact.  Extremities: No pitting edema lower extremities, +2 pulses  Skin: Dry, warm to touch, negative for any Rashes, No open wounds Wounds: per nursing  documentation   ------------------------------------------------------------------------------------------------------------------------------------------    LABs:     Latest Ref Rng & Units 02/19/2023    4:44 AM 02/18/2023    3:58 PM 02/05/2023    4:02 AM  CBC  WBC 4.0 - 10.5 K/uL 12.7  10.1  12.7   Hemoglobin 12.0 - 15.0 g/dL 9.2  9.3  8.7   Hematocrit 36.0 - 46.0 % 31.0  31.1  28.4   Platelets 150 - 400 K/uL 256  360  388       Latest Ref Rng & Units 02/19/2023    4:44 AM 02/18/2023    3:58 PM 02/06/2023    7:19 AM  CMP  Glucose 70 - 99 mg/dL 191  478  295   BUN 8 - 23 mg/dL 16  19  11    Creatinine 0.44 - 1.00 mg/dL 6.21  3.08  6.57   Sodium 135 - 145 mmol/L 143  143  135   Potassium 3.5 - 5.1 mmol/L 2.7  2.9  3.8   Chloride 98 - 111 mmol/L 107  106  104   CO2 22 - 32 mmol/L 28  29  25    Calcium 8.9 - 10.3 mg/dL 9.1  9.2  8.4   Total Protein 6.5 - 8.1 g/dL 5.6  5.8    Total Bilirubin 0.3 - 1.2 mg/dL 1.1  0.8    Alkaline Phos 38 - 126 U/L 44  45    AST 15 - 41 U/L 23  25    ALT 0 - 44 U/L 26  30         Micro Results Recent Results (from the past 240 hour(s))  Culture, blood (routine x 2) Call MD if unable to obtain prior to antibiotics being given     Status: None (Preliminary result)   Collection Time: 02/19/23  4:44 AM   Specimen: BLOOD  Result Value Ref Range Status   Specimen Description BLOOD  Final   Special Requests NONE  Final   Culture   Final    NO GROWTH < 12 HOURS Performed at [facility], [address] [place], [place] [address]    Report Status PENDING  Incomplete  Culture, blood (routine x 2) Call MD if unable to obtain prior to antibiotics being given     Status: None (Preliminary result)   Collection Time: 02/19/23  4:59 AM   Specimen: BLOOD  Result Value Ref Range Status   Specimen Description BLOOD  Final   Special Requests NONE  Final   Culture   Final    NO GROWTH < 12 HOURS Performed at [facility], [address], [place], [place]  [address]    Report Status PENDING  Incomplete    Radiology Reports DG Chest Portable 1 View  Result Date: 02/18/2023 CLINICAL DATA:  Altered mental status, tachypnea EXAM: PORTABLE CHEST 1 VIEW COMPARISON:  02/03/2023, CT 06/03/2018 FINDINGS: Retrocardiac opacification persists, likely related to a small to moderate left pleural effusion with associated left basilar atelectasis or infiltrate. Small right pleural effusion is unchanged. Pulmonary insufflation is stable. 16 mm nodule is seen within the a right mid lung zone which demonstrates slow but progressive enlargement since remote prior CT examination is suspicious for a low-grade malignancy. No pneumothorax. Stable cardiomegaly. Pulmonary vascularity is normal. No acute bone abnormality. IMPRESSION: 1. Small to moderate left pleural effusion with associated left basilar atelectasis or infiltrate. 2. Small right pleural effusion. 3. 16 mm nodule within the right mid lung zone which demonstrates slow but progressive enlargement since remote prior CT examination is suspicious for a low-grade malignancy. Dedicated nonemergent contrast enhanced CT examination is recommended for further characterization once the patient's acute clinical issues have resolved. Electronically Signed   By: Helyn Numbers M.D.   On: 02/18/2023 21:46   MR BRAIN WO CONTRAST  Result Date: 02/18/2023 CLINICAL DATA:  Altered mental status EXAM: MRI HEAD WITHOUT CONTRAST TECHNIQUE: Multiplanar, multiecho pulse sequences of the brain and surrounding structures were obtained without intravenous contrast. COMPARISON:  None Available. FINDINGS: Brain: No acute infarct, mass effect or extra-axial collection. Remote microhemorrhage in the right cerebellum. There is multifocal hyperintense T2-weighted signal within the white matter. Generalized volume loss. 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 Old left basal ganglia small vessel infarct. The  midline structures are normal. Vascular: Major flow voids are preserved. Skull and upper cervical spine: Normal calvarium and skull base. Visualized upper  cervical spine and soft tissues are normal. Sinuses/Orbits:No paranasal sinus fluid levels or advanced mucosal thickening. No mastoid or middle ear effusion. Normal orbits. IMPRESSION: 1. No acute intracranial abnormality. 2. 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 Electronically Signed   By: Deatra Robinson M.D.   On: 02/18/2023 20:18   CT Head Wo Contrast  Result Date: 02/18/2023 CLINICAL DATA:  Mental status change, unknown cause EXAM: CT HEAD WITHOUT CONTRAST TECHNIQUE: Contiguous axial images were obtained from the base of the skull through the vertex without intravenous contrast. RADIATION DOSE REDUCTION: This exam was performed according to the departmental dose-optimization program which includes automated exposure control, adjustment of the mA and/or kV according to patient size and/or use of iterative reconstruction technique. COMPARISON:  CT Head 02/03/23 FINDINGS: Brain: Sequela of severe chronic microvascular ischemic change. No hydrocephalus. No extra-axial fluid collection. Redemonstrated chronic right parietal lobe infarct. There is a 9 x 8 mm calcified lesion along the right cerebellar convexity, unchanged from prior exam, likely a meningioma. Vascular: No hyperdense vessel or unexpected calcification. Skull: Normal. Negative for fracture or focal lesion. Sinuses/Orbits: No middle ear or mastoid effusion. Paranasal sinuses are clear. Bilateral lens replacement. Orbits are otherwise unremarkable. Other: None. IMPRESSION: 1. No acute intracranial abnormality. 2. Sequela of severe chronic microvascular ischemic change with a few chronic infarcts. 3. Unchanged 9 mm calcified lesion along the right cerebellar convexity, likely a meningioma. Electronically Signed   By: Lorenza Cambridge M.D.   On: 02/18/2023 18:11     SIGNED: Kendell Bane, MD, FHM. FAAFP. [facility] - [facility] Time spent - 35 min.  In seeing, evaluating and examining the patient. Reviewing medical records, labs, drawn plan of care. [facility],  Pager (please use [email or web address] to page/ text) Please use Epic Secure Chat for non-urgent communication (7AM-7PM)  If 7PM-7AM, please contact night-coverage [email or web address], 02/19/2023, 11:31 AM

## 2023-02-19 NOTE — Hospital Course (Addendum)
[name] is a 82 y.o. female with medical history significant of hypertension, hyperlipidemia, type 2 diabetes mellitus, chronic diastolic heart failure, COPD and PAD with claudication s/p right lower extremity angioplasty on 6/25 who presented to the emergency department from home via EMS due to decreased oral intake secondary to Thrush.   Family was concerned of dehydration and UTI since patient has not been urinating much within the last 2 days.  Home nurses were concerned due to right hand becoming more crippled and with concern for stroke. Patient was recently admitted from 7/1 to 7/5 due to acute metabolic encephalopathy likely secondary to UTI (E. coli UTI).  Son states that patient has not been back to baseline since discharge from the hospital.  She was normally alert and talkative, but has been withdrawn since return from the hospital.  Patient was asked to spit out water/food when she is fed for 1 week.   ED Course:  In the emergency department, BP was 167/68, other vital signs are within normal range.  Workup in the ED showed normocytic anemia, BMP was normal except for potassium of 2.9 and blood glucose 266, albumin 2.9, magnesium 2.3. Chest x-ray showed small to moderate left pleural effusion with associated left basilar atelectasis or infiltrate.  Small right pleural effusion.  16 mm nodule within the right midlung zone which demonstrates slow but progressive enlargement since remote prior CT examination is suspicious for low-grade malignancy. MRI head without contrast showed no acute intracranial abnormality CTA head without contrast showed no acute intracranial abnormality.  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     Assessment & Plan:   Active Problems:   Mixed hyperlipidemia   Chronic diastolic CHF (congestive heart failure) (HCC)   COPD (chronic obstructive pulmonary disease) (HCC)   GERD without esophagitis   Hypoalbuminemia  due to protein-calorie malnutrition (HCC)   Type 2 diabetes mellitus with hyperglycemia (HCC)   Acute metabolic encephalopathy   HCAP (healthcare-associated pneumonia)   Bilateral pleural effusion   Hypokalemia   Pulmonary nodule   Present on Admission:  Acute metabolic encephalopathy  Hypoalbuminemia due to protein-calorie malnutrition (HCC)  Type 2 diabetes mellitus with hyperglycemia (HCC)  Mixed hyperlipidemia  COPD (chronic obstructive pulmonary disease) (HCC)  Chronic diastolic CHF (congestive heart failure) (HCC)  GERD without esophagitis   Assessment/Plan  Acute metabolic encephalopathy possibly due to multifactorial -Mentation has improved this morning, communicating, following commands -Poor insight This may be due to multifactorial including possible HCAP, deconditioning, failure to thrive in adult There was no report of polypharmacy Continue fall precaution   Possible HCAP POA -Continue IV cefepime and azithromycin,  -Cultures revealed no growth to date Continue Tylenol as needed Continue Mucinex, incentive spirometry, flutter valve    Bilateral pleural effusion Chest x-ray showed small to moderate left pleural effusion with associated left basilar atelectasis or infiltrate.  Small right pleural effusion. On admission Lasix 40 mg x 1 will be given   Hypokalemia -Monitoring and replenished   Hypoalbuminemia possibly secondary to moderate protein calorie malnutrition Albumin 2.9, protein supplement will be provided   Pulmonary nodule Chest x-ray showed 16 mm nodule within the right midlung zone which demonstrates slow but progressive enlargement since remote prior CT examination is suspicious for low-grade malignancy. 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   No RWMA.  G1 DD Continue home meds   GERD Continue Protonix

## 2023-02-19 NOTE — TOC Initial Note (Signed)
Transition of Care [facility]) - Initial/Assessment Note    Patient Details  Name: [name] MRN: [id number] Date of Birth: 1941-01-14  Transition of Care [facility]) CM/SW Contact:    Leitha Bleak, RN Phone Number: 02/19/2023, 4:39 PM  Clinical Narrative:  Patient admitted with acute metabolic encephalopathy. Patient has a high risk for readmission. CM spoke with her spouse. She lives at home. They have 2 Cpap aides that assist 5 days a week from 9 to 5. She is bed bound. They use a hoyer lift to transfer to wheelchair. They arrange transportation for doctors appointment. He states she she is medically better they will take her home. She gets the best care in the home. TOC following.               Expected Discharge Plan: Home w Home Health Services Barriers to Discharge: Continued Medical Work up   Patient Goals and CMS Choice Patient states their goals for this hospitalization and ongoing recovery are:: return home CMS Medicare.gov Compare Post Acute Care list provided to:: Patient Represenative (must comment) Choice offered to / list presented to : Spouse      Expected Discharge Plan and Services     Post Acute Care Choice: Home Health Living arrangements for the past 2 months: Single Family Home                         Prior Living Arrangements/Services Living arrangements for the past 2 months: Single Family Home Lives with:: Spouse Patient language and need for interpreter reviewed:: Yes        Need for Family Participation in Patient Care: Yes (Comment) Care giver support system in place?: Yes (comment) Current home services: DME Criminal Activity/Legal Involvement Pertinent to Current Situation/Hospitalization: No - Comment as needed  Activities of Daily Living      Permission Sought/Granted         Emotional Assessment     Affect (typically observed): Accepting Orientation: : Oriented to Self Alcohol / Substance Use: Not Applicable Psych Involvement: No  (comment)  Admission diagnosis:  Hypokalemia [E87.6] Pleural effusion on left [J90] Poor fluid intake [R63.8] Generalized weakness [R53.1] Pneumonia of left lower lobe due to infectious organism [J18.9] Acute metabolic encephalopathy [G93.41] Patient Active Problem List   Diagnosis Date Noted   HCAP (healthcare-associated pneumonia) 02/19/2023   Bilateral pleural effusion 02/19/2023   Hypokalemia 02/19/2023   Pulmonary nodule 02/19/2023   Acute metabolic encephalopathy 02/03/2023   Atherosclerosis of native arteries of the extremities with ulceration (HCC) 04/20/2021   COVID-19 virus infection 03/07/2021   Acute febrile illness 03/07/2021   Hypoalbuminemia due to protein-calorie malnutrition (HCC) 03/07/2021   Type 2 diabetes mellitus with hyperglycemia (HCC) 03/07/2021   Elevated troponin 03/07/2021   Constipation 03/07/2021   COPD (chronic obstructive pulmonary disease) (HCC) 10/20/2020   GERD without esophagitis 10/20/2020   Physical deconditioning 10/20/2020   Cellulitis 10/19/2020   Necrotic toes (HCC)    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)    Diabetic neuropathy (HCC)    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) 06/06/2019   Peripheral arterial disease (HCC) 04/27/2019   ESBL (extended spectrum beta-lactamase) producing bacteria infection 12/25/2018   PVD (peripheral vascular disease) (HCC) 09/12/2016   Ulcer of right lower extremity (HCC) 08/29/2016   Acute on chronic diastolic heart failure (HCC) 07/08/2016   HTN (hypertension) 07/05/2016   Sensorineural hearing loss (SNHL), 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   Impacted cerumen of both ears 06/05/2016  Chronic diastolic CHF (congestive heart failure) (HCC) 03/10/2016   Mixed hyperlipidemia 03/09/2016   Acute respiratory acidosis (HCC) 03/05/2016   Anemia, iron deficiency 03/03/2016   Cerebrovascular accident (CVA) due to thrombosis of precerebral artery (HCC) 03/01/2016   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) 03/01/2016   Atherosclerosis of native arteries of the extremities with gangrene (HCC) 12/08/2015   Anemia, chronic disease 04/27/2014   Unspecified hereditary and idiopathic peripheral neuropathy 03/14/2014   DM2 (diabetes mellitus, type 2) (HCC) 02/14/2014   Aortic stenosis 02/04/2014   Closed left subtrochanteric femur fracture (HCC) 02/03/2014   Hip fracture, left (HCC) 02/03/2014   Fibula fracture 02/03/2014   MTP instability 02/03/2014   Hip fracture (HCC) 02/03/2014   PCP:  Abram Sander, MD Pharmacy:   [facility] - [place], [place] - [address] [place] [address] Phone: [phone] Fax: [phone]     Social Determinants of Health (SDOH) Social History: SDOH Screenings   Food Insecurity: No Food Insecurity (02/03/2023)  Housing: Low Risk  (02/03/2023)  Transportation Needs: No Transportation Needs (02/03/2023)  Utilities: Not At Risk (02/03/2023)  Tobacco Use: Low Risk  (02/18/2023)   SDOH Interventions:    Readmission Risk Interventions    02/19/2023    4:37 PM 02/04/2023   12:07 PM  Readmission Risk Prevention Plan  Transportation Screening Complete Complete  PCP or Specialist Appt within 3-5 Days Complete   Home Care Screening  Complete  Medication Review (RN CM)  Complete  HRI or Home Care Consult Complete   Social Work Consult for Recovery Care Planning/Counseling Complete   Palliative Care Screening Not Applicable   Medication Review Oceanographer) Complete

## 2023-02-20 DIAGNOSIS — G9341 Metabolic encephalopathy: Secondary | ICD-10-CM

## 2023-02-20 DIAGNOSIS — J90 Pleural effusion, not elsewhere classified: Secondary | ICD-10-CM

## 2023-02-20 DIAGNOSIS — I5032 Chronic diastolic (congestive) heart failure: Secondary | ICD-10-CM | POA: Diagnosis not present

## 2023-02-20 DIAGNOSIS — J449 Chronic obstructive pulmonary disease, unspecified: Secondary | ICD-10-CM

## 2023-02-20 DIAGNOSIS — J189 Pneumonia, unspecified organism: Secondary | ICD-10-CM

## 2023-02-20 LAB — CBC
HCT: 29.9 % — ABNORMAL LOW (ref 36.0–46.0)
Hemoglobin: 8.9 g/dL — ABNORMAL LOW (ref 12.0–15.0)
MCH: 23.7 pg — ABNORMAL LOW (ref 26.0–34.0)
MCHC: 29.8 g/dL — ABNORMAL LOW (ref 30.0–36.0)
MCV: 79.5 fL — ABNORMAL LOW (ref 80.0–100.0)
Platelets: 274 10*3/uL (ref 150–400)
RBC: 3.76 MIL/uL — ABNORMAL LOW (ref 3.87–5.11)
RDW: 17.5 % — ABNORMAL HIGH (ref 11.5–15.5)
WBC: 10.7 10*3/uL — ABNORMAL HIGH (ref 4.0–10.5)
nRBC: 0 % (ref 0.0–0.2)

## 2023-02-20 LAB — GLUCOSE, CAPILLARY
Glucose-Capillary: 104 mg/dL — ABNORMAL HIGH (ref 70–99)
Glucose-Capillary: 118 mg/dL — ABNORMAL HIGH (ref 70–99)
Glucose-Capillary: 123 mg/dL — ABNORMAL HIGH (ref 70–99)
Glucose-Capillary: 134 mg/dL — ABNORMAL HIGH (ref 70–99)
Glucose-Capillary: 160 mg/dL — ABNORMAL HIGH (ref 70–99)
Glucose-Capillary: 250 mg/dL — ABNORMAL HIGH (ref 70–99)

## 2023-02-20 LAB — BASIC METABOLIC PANEL
Anion gap: 7 (ref 5–15)
BUN: 14 mg/dL (ref 8–23)
CO2: 28 mmol/L (ref 22–32)
Calcium: 8.8 mg/dL — ABNORMAL LOW (ref 8.9–10.3)
Chloride: 104 mmol/L (ref 98–111)
Creatinine, Ser: 0.45 mg/dL (ref 0.44–1.00)
GFR, Estimated: >60 mL/min (ref >60)
Glucose, Bld: 124 mg/dL — ABNORMAL HIGH (ref 70–99)
Potassium: 3 mmol/L — ABNORMAL LOW (ref 3.5–5.1)
Sodium: 139 mmol/L (ref 135–145)

## 2023-02-20 LAB — STREP PNEUMONIAE URINARY ANTIGEN: Strep Pneumo Urinary Antigen: NEGATIVE

## 2023-02-20 LAB — AMMONIA: Ammonia: <10 umol/L (ref 9–35)

## 2023-02-20 MED ORDER — HYDRALAZINE HCL 25 MG PO TABS
25.0000 mg | ORAL_TABLET | Freq: Three times a day (TID) | ORAL | Status: DC
  Administered 2023-02-20 – 2023-02-21 (×2): 25 mg via ORAL
  Filled 2023-02-20: qty 1

## 2023-02-20 MED ORDER — BUDESONIDE 0.25 MG/2ML IN SUSP
0.2500 mg | Freq: Two times a day (BID) | RESPIRATORY_TRACT | Status: DC
  Administered 2023-02-20 – 2023-02-21 (×2): 0.25 mg via RESPIRATORY_TRACT
  Filled 2023-02-20: qty 2

## 2023-02-20 MED ORDER — RISAQUAD PO CAPS
2.0000 | ORAL_CAPSULE | Freq: Three times a day (TID) | ORAL | Status: DC
  Administered 2023-02-20 – 2023-02-21 (×3): 2 via ORAL
  Filled 2023-02-20 (×3): qty 2

## 2023-02-20 MED ORDER — POTASSIUM CHLORIDE 10 MEQ/100ML IV SOLN
10.0000 meq | INTRAVENOUS | Status: AC
  Administered 2023-02-20 (×4): 10 meq via INTRAVENOUS
  Filled 2023-02-20: qty 100

## 2023-02-20 NOTE — Evaluation (Signed)
Clinical/Bedside Swallow Evaluation Patient Details  Name: [name] MRN: [id number] Date of Birth: 1940/11/16  Today's Date: 02/20/2023 Time: SLP Start Time (ACUTE ONLY): 1355 SLP Stop Time (ACUTE ONLY): 1419 SLP Time Calculation (min) (ACUTE ONLY): 24 min  Past Medical History:  Past Medical History:  Diagnosis Date   Acute heart failure (HCC)    Aortic stenosis    CHF (congestive heart failure) (HCC)    Complication of anesthesia    Hard to wake patient up after having anesthesia   Diabetes mellitus without complication (HCC)    Diabetic neuropathy (HCC)    Takes Lyrica   Edema of both legs    Takes Lasix   GERD (gastroesophageal reflux disease)    High cholesterol    HTN (hypertension)    Hypertension    PAD (peripheral artery disease) (HCC)    Shortness of breath dyspnea    Spasm of back muscles    Stroke (HCC)    Wound, open    Right great toe   Past Surgical History:  Past Surgical History:  Procedure Laterality Date   AMPUTATION TOE Left 06/09/2019   Procedure: Left third toe and partial great toe amputation and debridement;  Surgeon: Renford Dills, MD;  Location: [facility] ORS;  Service: Vascular;  Laterality: Left;   AMPUTATION TOE Left 04/06/2020   Procedure: AMPUTATION LEFT SECOND TOE;  Surgeon: Gwyneth Revels, DPM;  Location: [facility] OR[facility];  Service: Podiatry;  Laterality: Left;   BYPASS GRAFT POPLITEAL TO TIBIAL Right 02/28/2016   Procedure: BYPASS GRAFT RIGHT BELOW KNEE POPLITEAL TO PERONEAL USING REVERSED RIGHT GREATER SAPHENOUS VEIN;  Surgeon: Fransisco Hertz, MD;  Location: [facility] OR;  Service: Vascular;  Laterality: Right;   CYST EXCISION     Abdomen   EYE SURGERY Bilateral    Cataract removal   INTRAMEDULLARY (IM) NAIL INTERTROCHANTERIC Left 02/04/2014   Procedure: INTRAMEDULLARY (IM) NAIL INTERTROCHANTRIC FEMORAL;  Surgeon: Shelda Pal, MD;  Location: [facility];  Service: Orthopedics;  Laterality: Left;   IR GENERIC HISTORICAL  03/01/2016   IR ANGIO INTRA EXTRACRAN  SEL COM CAROTID INNOMINATE UNI R MOD SED 03/01/2016 Julieanne Cotton, MD [facility]   IR GENERIC HISTORICAL  03/01/2016   IR ENDOVASC INTRACRANIAL INF OTHER THAN THROMBO ART INC DIAG ANGIO 03/01/2016 Julieanne Cotton, MD [facility]   IR GENERIC HISTORICAL  03/01/2016   IR INTRAVSC STENT CERV CAROTID W/O EMB-PROT MOD SED INC ANGIO 03/01/2016 Julieanne Cotton, MD [facility]   IR GENERIC HISTORICAL  06/03/2016   IR RADIOLOGIST EVAL & MGMT 06/03/2016 [facility]   LOWER EXTREMITY ANGIOGRAPHY Left 02/09/2019   Procedure: LOWER EXTREMITY ANGIOGRAPHY;  Surgeon: Renford Dills, MD;  Location: [facility] INVASIVE CV [facility];  Service: Cardiovascular;  Laterality: Left;   LOWER EXTREMITY ANGIOGRAPHY Left 03/10/2019   Procedure: LOWER EXTREMITY ANGIOGRAPHY;  Surgeon: Renford Dills, MD;  Location: [facility];  Service: Cardiovascular;  Laterality: Left;   LOWER EXTREMITY ANGIOGRAPHY Left 04/27/2019   Procedure: LOWER EXTREMITY ANGIOGRAPHY;  Surgeon: Renford Dills, MD;  Location: [facility];  Service: Cardiovascular;  Laterality: Left;   LOWER EXTREMITY ANGIOGRAPHY Left 06/08/2019   Procedure: Lower Extremity Angiography;  Surgeon: Renford Dills, MD;  Location: [facility] INV[facility] CV LAB;  Service: Cardiovascular;  Laterality: Left;   LOWER EXTREMITY ANGIOGRAPHY Left 04/03/2020   Procedure: Lower Extremity Angiography;  Surgeon: Annice Needy, MD;  Location: [facility] IN[facility] LAB;  Service: Cardiovascular;  Laterality: Left;   LOWER EXTREMITY  ANGIOGRAPHY Right 10/20/2020   Procedure: Lower Extremity Angiography;  Surgeon: Renford Dills, MD;  Location[facility] INVASIVE CV LAB;  Service: Cardiovascular;  Laterality: Right;   LOWER EXTREMITY ANGIOGRAPHY Left 01/23/2021   Procedure: LOWER EXTREMITY ANGIOGRAPHY;  Surgeon: Renford Dills, MD;  Location: [facility] IN[facility];  Service: Cardiovascular;  Laterality: Left;   LOWER EXTREMITY ANGIOGRAPHY Right 04/10/2021   Procedure: LOWER  EXTREMITY ANGIOGRAPHY;  Surgeon: Renford Dills, MD;  Location: [facility];  Service: Cardiovascular;  Laterality: Right;   LOWER EXTREMITY ANGIOGRAPHY Right 01/28/2023   Procedure: Lower Extremity Angiography;  Surgeon: Renford Dills, MD;  Location: [facility] INV[facility] LAB;  Service: Cardiovascular;  Laterality: Right;   ORIF TOE FRACTURE Right 02/08/2014   Procedure: OPEN REDUCTION INTERNAL FIXATION Right METATARSAL  FRACTURE ;  Surgeon: Toni Arthurs, MD;  Location: [facility];  Service: Orthopedics;  Laterality: Right;   PERIPHERAL VASCULAR CATHETERIZATION N/A 12/28/2015   Procedure: Abdominal Aortogram w/Lower Extremity;  Surgeon: Fransisco Hertz, MD;  Location: [facility] INVASIVE CV LAB;  Service: Cardiovascular;  Laterality: N/A;   RADIOLOGY WITH ANESTHESIA N/A 03/01/2016   Procedure: RADIOLOGY WITH ANESTHESIA;  Surgeon: Julieanne Cotton, MD;  Location[facility];  Service: Radiology;  Laterality: N/A;   VEIN HARVEST Right 02/28/2016   Procedure: RIGHT GREATER SAPHENOUS VEIN HARVEST;  Surgeon: Fransisco Hertz, MD;  Location: [facility] OR;  Service: Vascular;  Laterality: Right;   HPI:  [name] is a 82 y.o. female with medical history significant of hypertension, hyperlipidemia, type 2 diabetes mellitus, chronic diastolic heart failure, COPD and PAD with claudication s/p right lower extremity angioplasty on 6/25 who presented to the emergency department from home via EMS due to decreased oral intake secondary to Thrush.    Family was concerned of dehydration and UTI since patient has not been urinating much within the last 2 days.  Home nurses were concerned due to right hand becoming more crippled and with concern for stroke.  Patient was recently admitted from 7/1 to 7/5 due to acute metabolic encephalopathy likely secondary to UTI (E. coli UTI).  Son states that patient has not been back to baseline since discharge from the hospital.  She was normally alert and talkative, but has been withdrawn since return  from the hospital. In the emergency department, BP was 167/68, other vital signs are within normal range.  Workup in the ED showed normocytic anemia, BMP was normal except for potassium of 2.9 and blood glucose 266, albumin 2.9, magnesium 2.3.  Chest x-ray showed small to moderate left pleural effusion with associated left basilar atelectasis or infiltrate.  Small right pleural effusion.  16 mm nodule within the right midlung zone which demonstrates slow but progressive enlargement since remote prior CT examination is suspicious for low-grade malignancy.  MRI head without contrast showed no acute intracranial abnormality  CTA head without contrast showed no acute intracranial abnormality.  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  BSE requested.    Assessment / Plan / Recommendation  Clinical Impression  Pt seen for clinical swallow evaluation, however assessment with PO was limited due to Pt refusal to take solids. Pt's husband in room and reported that prior to oral thrush, she ate and drank without issue (dependent feeder). She found it painful to swallow the past few days and refused to eat/drink, but now reports symptoms are better today. Oral cavity is clear. Pt with mild lingual deviation to the left. Pt  without signs of symptoms of aspiration with thin liquids, but refused solid food trials despite encouragement with SLP and spouse. OK to continue diet as ordered and provide feeder assist. Pt typically takes her medications whole, but they were crushing during this admission. SLP will follow during acute stay. SLP Visit Diagnosis: Dysphagia, unspecified (R13.10)    Aspiration Risk  No limitations;Risk for inadequate nutrition/hydration (due to cognitive deficits)    Diet Recommendation Regular;Dysphagia 3 (Mech soft);Thin liquid    Liquid Administration via: Straw Medication Administration: Whole meds with liquid Supervision: Staff to assist with  self feeding;Full supervision/cueing for compensatory strategies Compensations: Slow rate;Small sips/bites Postural Changes: Seated upright at 90 degrees;Remain upright for at least 30 minutes after po intake    Other  Recommendations Oral Care Recommendations: Oral care BID;Staff/trained caregiver to provide oral care    Recommendations for follow up therapy are one component of a multi-disciplinary discharge planning process, led by the attending physician.  Recommendations may be updated based on patient status, additional functional criteria and insurance authorization.  Follow up Recommendations No SLP follow up      Assistance Recommended at Discharge    Functional Status Assessment Patient has had a recent decline in their functional status and demonstrates the ability to make significant improvements in function in a reasonable and predictable amount of time.  Frequency and Duration min 2x/week  1 week       Prognosis Prognosis for improved oropharyngeal function: Fair Barriers to Reach Goals: Cognitive deficits      Swallow Study   General Date of Onset: 02/18/23 HPI: [name] is a 82 y.o. female with medical history significant of hypertension, hyperlipidemia, type 2 diabetes mellitus, chronic diastolic heart failure, COPD and PAD with claudication s/p right lower extremity angioplasty on 6/25 who presented to the emergency department from home via EMS due to decreased oral intake secondary to Thrush.    Family was concerned of dehydration and UTI since patient has not been urinating much within the last 2 days.  Home nurses were concerned due to right hand becoming more crippled and with concern for stroke.  Patient was recently admitted from 7/1 to 7/5 due to acute metabolic encephalopathy likely secondary to UTI (E. coli UTI).  Son states that patient has not been back to baseline since discharge from the hospital.  She was normally alert and talkative, but has been  withdrawn since return from the hospital. In the emergency department, BP was 167/68, other vital signs are within normal range.  Workup in the ED showed normocytic anemia, BMP was normal except for potassium of 2.9 and blood glucose 266, albumin 2.9, magnesium 2.3.  Chest x-ray showed small to moderate left pleural effusion with associated left basilar atelectasis or infiltrate.  Small right pleural effusion.  16 mm nodule within the right midlung zone which demonstrates slow but progressive enlargement since remote prior CT examination is suspicious for low-grade malignancy.  MRI head without contrast showed no acute intracranial abnormality  CTA head without contrast showed no acute intracranial abnormality.  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  BSE requested. Type of Study: Bedside Swallow Evaluation Previous Swallow Assessment: BSE 04/15/2018 D3/thin Diet Prior to this Study: Regular;Thin liquids (Level 0) Temperature Spikes Noted: No Respiratory Status: Room air History of Recent Intubation: No Behavior/Cognition: Alert Oral Cavity Assessment: Within Functional Limits Oral Care Completed by SLP: Recent completion by staff Oral Cavity - Dentition: Dentures, top;Dentures, bottom Vision: Impaired  for self-feeding Self-Feeding Abilities: Total assist Patient Positioning: Upright in bed Baseline Vocal Quality: Normal Volitional Cough: Strong Volitional Swallow: Able to elicit    Oral/Motor/Sensory Function Overall Oral Motor/Sensory Function: Mild impairment Lingual Symmetry: Abnormal symmetry left   Ice Chips Ice chips: Within functional limits Presentation: Spoon   Thin Liquid Thin Liquid: Within functional limits Presentation: Straw    Nectar Thick Nectar Thick Liquid: Not tested   Honey Thick Honey Thick Liquid: Not tested   Puree Puree: Within functional limits Presentation: Spoon   Solid     Solid:  (Pt refused)     Thank  you,  Havery Moros, CCC-SLP [phone]  Mauro Arps 02/20/2023,2:23 PM

## 2023-02-20 NOTE — TOC Progression Note (Addendum)
Transition of Care [facility]) - Progression Note    Patient Details  Name: [name] MRN: [id number] Date of Birth: [birth year]  Transition of Care [facility]) CM/SW Contact  Catalina Gravel, [place] Phone Number: 02/20/2023, 10:33 AM  Clinical Narrative:    CSW met with spouse at bedside.  Pt sleeping. Spouse stated he visited yesterday and was pleased that pt is more like herself.  He also stated their Renato Gails visited and pt was able to talk with him. Spouse said HHPT provider [facility], plan to continue at DC for HHPT.  CSW sent a message to Jacquenette Shone to inform pt will DC soon and is active with provider.  TOC to follow.      Expected Discharge Plan: Home w Home Health Services Barriers to Discharge: Continued Medical Work up  Expected Discharge Plan and Services     Post Acute Care Choice: Home Health Living arrangements for the past 2 months: Single Family Home                                       Social Determinants of Health (SDOH) Interventions SDOH Screenings   Food Insecurity: No Food Insecurity (02/03/2023)  Housing: Low Risk  (02/03/2023)  Transportation Needs: No Transportation Needs (02/03/2023)  Utilities: Not At Risk (02/03/2023)  Tobacco Use: Low Risk  (02/18/2023)    Readmission Risk Interventions    02/19/2023    4:37 PM 02/04/2023   12:07 PM  Readmission Risk Prevention Plan  Transportation Screening Complete Complete  PCP or Specialist Appt within 3-5 Days Complete   Home Care Screening  Complete  Medication Review (RN CM)  Complete  HRI or Home Care Consult Complete   Social Work Consult for Recovery Care Planning/Counseling Complete   Palliative Care Screening Not Applicable   Medication Review Oceanographer) Complete

## 2023-02-20 NOTE — Progress Notes (Signed)
PT Cancellation Note  Patient Details Name: [name] MRN: [id number] DOB: Dec 25, 1940   Cancelled Treatment:    Reason Eval/Treat Not Completed: PT screened, no needs identified, will sign off.  Patient is at baseline, non-ambulatory, total assist for ADLs, family uses mechanical lift for transfers. Recommend nursing staff for out of bed to chair daily as tolerated using mechanical lift for length of stay.    3:09 PM, 02/20/23 Ocie Bob, MPT Physical Therapist with [facility] [phone] office [phone] mobile phone

## 2023-02-20 NOTE — Progress Notes (Signed)
PROGRESS NOTE    Patient: [name]                            PCP: Abram Sander, MD                    DOB: [birth year]            DOA: 02/18/2023 OZD:[id number]             DOS: 02/20/2023, 12:57 PM   LOS: 1 day   Date of Service: The patient was seen and examined on 02/20/2023  Subjective:   The patient was seen and examined this morning, mentation has much improved, awake, following commands  CTA and MRI of the head was negative  Brief Narrative:   [name] is a 82 y.o. female with medical history significant of hypertension, hyperlipidemia, type 2 diabetes mellitus, chronic diastolic heart failure, COPD and PAD with claudication s/p right lower extremity angioplasty on 6/25 who presented to the emergency department from home via EMS due to decreased oral intake secondary to Thrush.   Family was concerned of dehydration and UTI since patient has not been urinating much within the last 2 days.  Home nurses were concerned due to right hand becoming more crippled and with concern for stroke. Patient was recently admitted from 7/1 to 7/5 due to acute metabolic encephalopathy likely secondary to UTI (E. coli UTI).  Son states that patient has not been back to baseline since discharge from the hospital.  She was normally alert and talkative, but has been withdrawn since return from the hospital.  Patient was asked to spit out water/food when she is fed for 1 week.   ED Course:  In the emergency department, BP was 167/68, other vital signs are within normal range.  Workup in the ED showed normocytic anemia, BMP was normal except for potassium of 2.9 and blood glucose 266, albumin 2.9, magnesium 2.3. Chest x-ray showed small to moderate left pleural effusion with associated left basilar atelectasis or infiltrate.  Small right pleural effusion.  16 mm nodule within the right midlung zone which demonstrates slow but progressive enlargement since remote prior CT examination is suspicious for  low-grade malignancy. MRI head without contrast showed no acute intracranial abnormality CTA head without contrast showed no acute intracranial abnormality.  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     Assessment & Plan:   Active Problems:   Mixed hyperlipidemia   Chronic diastolic CHF (congestive heart failure) (HCC)   COPD (chronic obstructive pulmonary disease) (HCC)   GERD without esophagitis   Hypoalbuminemia due to protein-calorie malnutrition (HCC)   Type 2 diabetes mellitus with hyperglycemia (HCC)   Acute metabolic encephalopathy   HCAP (healthcare-associated pneumonia)   Bilateral pleural effusion   Hypokalemia   Pulmonary nodule   Present on Admission:  Acute metabolic encephalopathy  Hypoalbuminemia due to protein-calorie malnutrition (HCC)  Type 2 diabetes mellitus with hyperglycemia (HCC)  Mixed hyperlipidemia  COPD (chronic obstructive pulmonary disease) (HCC)  Chronic diastolic CHF (congestive heart failure) (HCC)  GERD without esophagitis   Assessment/Plan  Acute metabolic encephalopathy possibly due to multifactorial -Mentation has improved this morning, communicating, following commands -Poor insight This may be due to multifactorial including possible HCAP, deconditioning, failure to thrive in adult There was no report of polypharmacy Continue fall precaution   Possible HCAP POA -Continue IV cefepime and azithromycin,  -  Cultures revealed no growth to date Continue Tylenol as needed Continue Mucinex, incentive spirometry, flutter valve    Bilateral pleural effusion Chest x-ray showed small to moderate left pleural effusion with associated left basilar atelectasis or infiltrate.  Small right pleural effusion. On admission Lasix 40 mg x 1 will be given   Hypokalemia -Monitoring and replenished   Hypoalbuminemia possibly secondary to moderate protein calorie malnutrition Albumin 2.9, protein  supplement will be provided   Pulmonary nodule Chest x-ray showed 16 mm nodule within the right midlung zone which demonstrates slow but progressive enlargement since remote prior CT examination is suspicious for low-grade malignancy. 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   No RWMA.  G1 DD Continue home meds   GERD Continue Protonix    --------------------------------------------------------------------------------------------------------------------------------- Nutritional status:  The patient's BMI is: Body mass index is 19.57 kg/m. I agree with the assessment and plan as outlined   Skin Assessment: I have examined the patient's skin and I agree with the wound assessment as performed by wound care team As outlined belowe: Pressure Injury 04/01/20 Buttocks Right;Left Stage II -  Partial thickness loss of dermis presenting as a shallow open ulcer with a red, pink wound bed without slough. stage 2 and MASD (Active)  04/01/20 2045  Location: Buttocks  Location Orientation: Right;Left  Staging: Stage II -  Partial thickness loss of dermis presenting as a shallow open ulcer with a red, pink wound bed without slough.  Wound Description (Comments): stage 2 and MASD  Present on Admission: Yes   ---------------------------------------------------------------------------------------------------------------------------- Cultures; Blood Cultures x 2 >>  Urine Culture  >>>    -----------------------------------------------------------------------------------------------------------------------------  DVT prophylaxis:  apixaban (ELIQUIS) tablet 2.5 mg Start: 02/19/23 1030 SCDs Start: 02/19/23 0402 apixaban (ELIQUIS) tablet 2.5 mg   Code Status:   Code Status: Full Code  Family Communication: No family member present at bedside- attempt will be made to update daily The above findings and plan of care has been discussed with patient (and family)  in detail,  they expressed understanding and agreement of above. -Advance care planning has been discussed.   Admission status:   Status is: Inpatient Remains inpatient appropriate because: Evaluation for PT, treating with IV antibiotics, investigate underlying cause   Disposition: From  - home             Planning for discharge in 1-2 days: to   Procedures:   No admission procedures for hospital encounter.   Antimicrobials:  Anti-infectives (From admission, onward)    Start     Dose/Rate Route Frequency Ordered Stop   02/19/23 1000  ceFEPIme (MAXIPIME) 2 g in sodium chloride 0.9 % 100 mL IVPB        2 g 200 mL/hr over 30 Minutes Intravenous Every 12 hours 02/19/23 0406     02/18/23 2220  sodium chloride 0.9 % with ceFEPIme (MAXIPIME) ADS Med       Note to Pharmacy: Grant Fontana : cabinet override      02/18/23 2220 02/19/23 1044   02/18/23 2215  ceFEPIme (MAXIPIME) 2 g in sodium chloride 0.9 %  100 mL IVPB        2 g 200 mL/hr over 30 Minutes Intravenous  Once 02/18/23 2203 02/18/23 2302   02/18/23 2215  azithromycin (ZITHROMAX) 500 mg in sodium chloride 0.9 % 250 mL IVPB        500 mg 250 mL/hr over 60 Minutes Intravenous Every 24 hours 02/18/23 2203          Medication:   amLODipine  10 mg Oral Daily   apixaban  2.5 mg Oral BID   atorvastatin  20 mg Oral Daily   budesonide  0.25 mg Nebulization BID   carvedilol  6.25 mg Oral BID WC   dextromethorphan-guaiFENesin  1 tablet Oral BID    feeding supplement (GLUCERNA SHAKE)  237 mL Oral TID BM   hydrALAZINE  25 mg Oral Q8H   insulin aspart  0-9 Units Subcutaneous Q4H   pantoprazole  40 mg Oral QAC breakfast    acetaminophen **OR** acetaminophen, ondansetron **OR** ondansetron (ZOFRAN) IV   Objective:   Vitals:   02/19/23 1813 02/19/23 2016 02/19/23 2049 02/20/23 0344  BP: (!) 141/61  (!) 106/44 99/60  Pulse:   68 70  Resp:   16 18  Temp:    97.9 F (36.6 C)  TempSrc:    Oral  SpO2:  99% 99% 100%  Weight:        Intake/Output Summary (Last 24 hours) at 02/20/2023 1257 Last data filed at 02/20/2023 0900 Gross per 24 hour  Intake 876.95 ml  Output 250 ml  Net 626.95 ml   Filed Weights   02/19/23 1000  Weight: 60.1 kg     Physical examination:      General:  More awake today.  Cooperative, no distress;   HEENT:  Normocephalic, PERRL, otherwise with in Normal limits   Neuro:  CNII-XII intact. , normal motor and sensation, reflexes intact   Lungs:   Clear to auscultation BL, Respirations unlabored,  No wheezes / crackles  Cardio:    S1/S2, RRR, No murmure, No Rubs or Gallops   Abdomen:  Soft, non-tender, bowel sounds active all four quadrants, no guarding or peritoneal signs.  Muscular  skeletal:  Limited exam -global generalized weaknesses - in bed, able to move all 4 extremities,   2+ pulses,  symmetric, No pitting edema  Skin:  Dry, warm to touch, negative for any Rashes,  Wounds: Please see nursing documentation  Pressure Injury 04/01/20 Buttocks Right;Left Stage II -  Partial thickness loss of dermis presenting as a shallow open ulcer with a red, pink wound bed without slough. stage 2 and MASD (Active)  04/01/20 2045  Location: Buttocks  Location Orientation: Right;Left  Staging: Stage II -  Partial thickness loss of dermis presenting as a shallow open ulcer with a red, pink wound bed without slough.  Wound Description (Comments): stage 2 and MASD  Present on Admission: Yes        ----------------------------------------------------------------------------------------------------------------------------    LABs:     Latest Ref Rng & Units 02/20/2023    4:27 AM 02/19/2023    4:44 AM 02/18/2023    3:58 PM  CBC  WBC 4.0 - 10.5 K/uL 10.7  12.7  10.1   Hemoglobin 12.0 - 15.0 g/dL 8.9  9.2  9.3   Hematocrit 36.0 - 46.0 % 29.9  31.0  31.1   Platelets 150 - 400 K/uL 274  256  360       Latest Ref Rng & Units 02/20/2023    4:27 AM  02/19/2023    4:44 AM 02/18/2023    3:58 PM  CMP  Glucose 70 - 99 mg/dL 782  956  213   BUN 8 - 23 mg/dL 14  16  19    Creatinine 0.44 - 1.00 mg/dL 0.86  5.78  4.69   Sodium 135 - 145 mmol/L 139  143  143   Potassium 3.5 - 5.1 mmol/L 3.0  2.7  2.9   Chloride 98 - 111 mmol/L 104  107  106   CO2 22 - 32 mmol/L 28  28  29    Calcium 8.9 - 10.3 mg/dL 8.8  9.1  9.2   Total Protein 6.5 - 8.1 g/dL  5.6  5.8   Total Bilirubin 0.3 - 1.2 mg/dL  1.1  0.8   Alkaline Phos 38 - 126 U/L  44  45   AST 15 - 41 U/L  23  25   ALT 0 - 44 U/L  26  30        Micro Results Recent Results (from the past 240 hour(s))  Culture, blood (routine x 2) Call MD if unable to obtain prior to antibiotics being given     Status: None (Preliminary result)   Collection Time: 02/19/23  4:44 AM   Specimen: BLOOD  Result Value Ref Range Status   Specimen Description BLOOD  Final   Special Requests NONE  Final   Culture   Final    NO GROWTH 1 DAY Performed at [facility], [address] [place], [place] [address]    Report Status PENDING  Incomplete  Culture, blood (routine x 2) Call MD if unable to obtain prior to antibiotics being given     Status: None (Preliminary result)   Collection Time: 02/19/23  4:59 AM   Specimen: BLOOD  Result Value Ref Range Status   Specimen Description BLOOD  Final   Special Requests NONE  Final   Culture   Final    NO GROWTH 1 DAY Performed at [facility], [address], [place], [place] [address]    Report Status PENDING   Incomplete    Radiology Reports No results found.  SIGNED: Kendell Bane, MD, FHM. FAAFP. [facility] - [facility] Time spent - 35 min.  In seeing, evaluating and examining the patient. Reviewing medical records, labs, drawn plan of care. [facility],  Pager (please use [email or web address] to page/ text) Please use Epic Secure Chat for non-urgent communication (7AM-7PM)  If 7PM-7AM, please contact night-coverage [email or web address], 02/20/2023, 12:57 PM

## 2023-02-20 NOTE — Care Management Important Message (Signed)
Important Message  Patient Details  Name: NIKAYLA MADARIS MRN: [id number] Date of Birth: 07-Mar-1941   Medicare Important Message Given:  N/A - LOS <3 / Initial given by admissions     Corey Harold 02/20/2023, 10:36 AM

## 2023-02-21 DIAGNOSIS — G9341 Metabolic encephalopathy: Secondary | ICD-10-CM | POA: Diagnosis not present

## 2023-02-21 DIAGNOSIS — I5032 Chronic diastolic (congestive) heart failure: Secondary | ICD-10-CM | POA: Diagnosis not present

## 2023-02-21 DIAGNOSIS — J449 Chronic obstructive pulmonary disease, unspecified: Secondary | ICD-10-CM | POA: Diagnosis not present

## 2023-02-21 DIAGNOSIS — J90 Pleural effusion, not elsewhere classified: Secondary | ICD-10-CM | POA: Diagnosis not present

## 2023-02-21 LAB — BASIC METABOLIC PANEL
Anion gap: 8 (ref 5–15)
BUN: 12 mg/dL (ref 8–23)
CO2: 22 mmol/L (ref 22–32)
Calcium: 8.7 mg/dL — ABNORMAL LOW (ref 8.9–10.3)
Chloride: 104 mmol/L (ref 98–111)
Creatinine, Ser: 0.44 mg/dL (ref 0.44–1.00)
GFR, Estimated: >60 mL/min (ref >60)
Glucose, Bld: 130 mg/dL — ABNORMAL HIGH (ref 70–99)
Potassium: 3.7 mmol/L (ref 3.5–5.1)
Sodium: 134 mmol/L — ABNORMAL LOW (ref 135–145)

## 2023-02-21 LAB — CBC
HCT: 28.7 % — ABNORMAL LOW (ref 36.0–46.0)
Hemoglobin: 8.6 g/dL — ABNORMAL LOW (ref 12.0–15.0)
MCH: 24 pg — ABNORMAL LOW (ref 26.0–34.0)
MCHC: 30 g/dL (ref 30.0–36.0)
MCV: 79.9 fL — ABNORMAL LOW (ref 80.0–100.0)
Platelets: 205 10*3/uL (ref 150–400)
RBC: 3.59 MIL/uL — ABNORMAL LOW (ref 3.87–5.11)
RDW: 17.6 % — ABNORMAL HIGH (ref 11.5–15.5)
WBC: 10.6 10*3/uL — ABNORMAL HIGH (ref 4.0–10.5)
nRBC: 0 % (ref 0.0–0.2)

## 2023-02-21 LAB — GLUCOSE, CAPILLARY
Glucose-Capillary: 145 mg/dL — ABNORMAL HIGH (ref 70–99)
Glucose-Capillary: 101 mg/dL — ABNORMAL HIGH (ref 70–99)
Glucose-Capillary: 141 mg/dL — ABNORMAL HIGH (ref 70–99)

## 2023-02-21 LAB — CULTURE, BLOOD (ROUTINE X 2)

## 2023-02-21 MED ORDER — LEVOFLOXACIN 500 MG PO TABS: 500.0000 mg | ORAL_TABLET | Freq: Every day | ORAL | 0 refills | Status: AC

## 2023-02-21 MED ORDER — RISAQUAD PO CAPS: 2.0000 | ORAL_CAPSULE | Freq: Three times a day (TID) | ORAL | 0 refills | Status: AC

## 2023-02-21 MED ORDER — LEVOFLOXACIN 500 MG PO TABS: 500.0000 mg | ORAL_TABLET | Freq: Every day | ORAL | Status: DC

## 2023-02-21 NOTE — Progress Notes (Signed)
OT Cancellation Note  Patient Details Name: LAQUINDA MOLLER MRN: [id number] DOB: [birth year]   Cancelled Treatment:    Reason Eval/Treat Not Completed: OT screened, no needs identified, will sign off. Pt is total assist for ADL's at baseline. Mechanical lift used at baseline. Pt will be removed from the OT list.   Danie Chandler OT, MOT   Danie Chandler 02/21/2023, 8:05 AM

## 2023-02-21 NOTE — Care Management Important Message (Signed)
Important Message  Patient Details  Name: [name] MRN: [id number] Date of Birth: 1941-03-08   Medicare Important Message Given:  N/A - LOS <3 / Initial given by admissions     Corey Harold 02/21/2023, 2:14 PM

## 2023-02-21 NOTE — Plan of Care (Signed)
  Problem: Activity: Goal: Ability to return to baseline activity level will improve Outcome: Progressing   Problem: Cardiovascular: Goal: Ability to achieve and maintain adequate cardiovascular perfusion will improve Outcome: Progressing Goal: Vascular access site(s) Level 0-1 will be maintained Outcome: Progressing   Problem: Clinical Measurements: Goal: Will remain free from infection Outcome: Progressing Goal: Respiratory complications will improve Outcome: Progressing Goal: Cardiovascular complication will be avoided Outcome: Progressing   Problem: Pain Managment: Goal: General experience of comfort will improve Outcome: Progressing   Problem: Safety: Goal: Ability to remain free from injury will improve Outcome: Progressing   Problem: Clinical Measurements: Goal: Ability to maintain a body temperature in the normal range will improve Outcome: Progressing   Problem: Respiratory: Goal: Ability to maintain adequate ventilation will improve Outcome: Progressing Goal: Ability to maintain a clear airway will improve Outcome: Progressing

## 2023-02-21 NOTE — Discharge Summary (Signed)
Physician Discharge Summary   Patient: [name] MRN: [id number] DOB: 12-11-1940  Admit date:     02/18/2023  Discharge date: 02/21/23  Discharge Physician: Kendell Bane   PCP: Abram Sander, MD   Recommendations at discharge:    F/up with PCP in 2-4 weeks  F/up with Vascular in 2 Weeks (per family - Vascular has taken her off Plavix  To Continure ASA and Eliquis   Discharge Diagnoses: Active Problems:   Mixed hyperlipidemia   Chronic diastolic CHF (congestive heart failure) (HCC)   COPD (chronic obstructive pulmonary disease) (HCC)   GERD without esophagitis   Hypoalbuminemia due to protein-calorie malnutrition (HCC)   Type 2 diabetes mellitus with hyperglycemia (HCC)   Acute metabolic encephalopathy   HCAP (healthcare-associated pneumonia)   Bilateral pleural effusion   Hypokalemia   Pulmonary nodule  Resolved Problems:   * No resolved hospital problems. *  Hospital Course: [name] is a 82 y.o. female with medical history significant of hypertension, hyperlipidemia, type 2 diabetes mellitus, chronic diastolic heart failure, COPD and PAD with claudication s/p right lower extremity angioplasty on 6/25 who presented to the emergency department from home via EMS due to decreased oral intake secondary to Thrush.   Family was concerned of dehydration and UTI since patient has not been urinating much within the last 2 days.  Home nurses were concerned due to right hand becoming more crippled and with concern for stroke. Patient was recently admitted from 7/1 to 7/5 due to acute metabolic encephalopathy likely secondary to UTI (E. coli UTI).  Son states that patient has not been back to baseline since discharge from the hospital.  She was normally alert and talkative, but has been withdrawn since return from the hospital.  Patient was asked to spit out water/food when she is fed for 1 week.   ED Course:  In the emergency department, BP was 167/68, other vital signs are  within normal range.  Workup in the ED showed normocytic anemia, BMP was normal except for potassium of 2.9 and blood glucose 266, albumin 2.9, magnesium 2.3. Chest x-ray showed small to moderate left pleural effusion with associated left basilar atelectasis or infiltrate.  Small right pleural effusion.  16 mm nodule within the right midlung zone which demonstrates slow but progressive enlargement since remote prior CT examination is suspicious for low-grade malignancy. MRI head without contrast showed no acute intracranial abnormality CTA head without contrast showed no acute intracranial abnormality.  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    Present on Admission:  Acute metabolic encephalopathy  Hypoalbuminemia due to protein-calorie malnutrition (HCC)  Type 2 diabetes mellitus with hyperglycemia (HCC)  Mixed hyperlipidemia  COPD (chronic obstructive pulmonary disease) (HCC)  Chronic diastolic CHF (congestive heart failure) (HCC)  GERD without esophagitis     Acute metabolic encephalopathy possibly due to multifactorial -Mentation has improved this morning, communicating, following commands -Poor insight This may be due to multifactorial including possible HCAP, deconditioning, failure to thrive in adult There was no report of polypharmacy Continue fall precaution   Possible HCAP POA -Continue IV cefepime and azithromycin >>> changes to PO Levaquin   -Cultures revealed no growth to date Continue Tylenol as needed Continue Mucinex, incentive spirometry, flutter valve    Bilateral pleural effusion Chest x-ray showed small to moderate left pleural effusion with associated left basilar atelectasis or infiltrate.  Small right pleural effusion. On admission Lasix 40 mg x 1 will be given  Hypokalemia - resolved eplenished   Hypoalbuminemia possibly secondary to moderate protein calorie malnutrition Albumin 2.9, protein supplement  will be provided   Pulmonary nodule Chest x-ray showed 16 mm nodule within the right midlung zone which demonstrates slow but progressive enlargement since remote prior CT examination is suspicious for low-grade malignancy. 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   No RWMA.  G1 DD Continue home meds   GERD Continue Protonix  Assessment and Plan: No notes have been filed under this hospital service. Service: Hospitalist         Disposition: Home health Diet recommendation:  Discharge Diet Orders (From admission, onward)     Start     Ordered   02/21/23 0000  Diet - low sodium heart healthy        02/21/23 1113           Cardiac and Carb modified diet DISCHARGE MEDICATION: Allergies as of 02/21/2023       Reactions   Egg-derived Products Shortness Of Breath   Iodine Anaphylaxis   Penicillins Anaphylaxis   Tolerates ceftriaxone, cefazolin Did it involve swelling of the face/tongue/throat, SOB, or low BP? Unknown Did it involve sudden or severe rash/hives, skin peeling, or any reaction on the inside of your mouth or nose? Unknown Did you need to seek medical attention at a hospital or doctor's office? Unknown When did it last happen?      Unknown If all above answers are "NO", may proceed with cephalosporin use.   Shellfish Allergy Anaphylaxis   Sulfa Antibiotics Anaphylaxis   Sulfacetamide Sodium Anaphylaxis   Sulfasalazine Anaphylaxis   Morphine And Codeine Other (See Comments)   Altered mental status         Medication List     STOP taking these medications    cetirizine 10 MG tablet Commonly known as: ZYRTEC   clopidogrel 75 MG tablet Commonly known as: PLAVIX   gabapentin 300 MG capsule Commonly known as: NEURONTIN       TAKE these medications    acetaminophen 500 MG tablet Commonly known as: TYLENOL Take 1,000 mg by mouth every 6 (six) hours as needed for mild pain.   acidophilus Caps capsule Take 2 capsules by mouth 3 (three) times daily for 5 days.   amLODipine 10 MG tablet Commonly known as: NORVASC Take 10 mg by mouth daily.   apixaban 2.5 MG Tabs tablet Commonly known as: Eliquis Take 1 tablet (2.5 mg total) by mouth 2 (two) times daily.   aspirin EC 81 MG tablet Take 1 tablet (81 mg total) by mouth daily.   atorvastatin 20 MG tablet Commonly known as: LIPITOR Take 1 tablet (20 mg total) by mouth daily. What changed: when to take this   budesonide 0.25 MG/2ML nebulizer solution Commonly known as: PULMICORT Take 2 mLs (0.25 mg total) by nebulization 2 (two) times daily.   carbamide peroxide 6.5 % OTIC solution Commonly known as: DEBROX Place 5 drops into both ears 2 (two) times daily as needed (ear wax).   carvedilol 6.25 MG tablet Commonly known as: COREG Take 6.25  mg by mouth 2 (two) times daily with a meal.   cholecalciferol 25 MCG (1000 UNIT) tablet Commonly known as: VITAMIN D3 Take 1,000 Units by mouth daily.   diclofenac Sodium 1 % Gel Commonly known as: VOLTAREN Apply 1 application topically 4 (four) times daily as needed (pain).   docusate sodium 100 MG capsule Commonly known as: COLACE Take 100 mg by mouth daily.   fluticasone 50 MCG/ACT nasal spray Commonly known as: FLONASE Place 1 spray into both nostrils daily as needed for allergies.   hydrALAZINE 25 MG tablet Commonly known as: APRESOLINE Take 1 tablet (25 mg total) by mouth every 8 (eight) hours.   insulin glargine 100 UNIT/ML injection Commonly known as:  LANTUS Inject 0.1 mLs (10 Units total) into the skin at bedtime. What changed: how much to take   levofloxacin 500 MG tablet Commonly known as: LEVAQUIN Take 1 tablet (500 mg total) by mouth daily for 3 days.   LUBRICATING EYE DROPS OP Place 1 drop into both eyes daily as needed (dry eyes).   magnesium hydroxide 400 MG/5ML suspension Commonly known as: MILK OF MAGNESIA Take 15 mLs by mouth daily as needed for mild constipation.   magnesium oxide 400 MG tablet Commonly known as: MAG-OX Take 400 mg by mouth 2 (two) times daily.   modafinil 200 MG tablet Commonly known as: PROVIGIL Take 200 mg by mouth daily.   Nyamyc powder Generic drug: nystatin Apply 1 application topically daily.   omeprazole 20 MG capsule Commonly known as: PRILOSEC Take 1 capsule (20 mg total) by mouth daily.   polyethylene glycol 17 g packet Commonly known as: MIRALAX / GLYCOLAX Take 17 g by mouth 2 (two) times daily. What changed:  when to take this reasons to take this   potassium chloride SA 20 MEQ tablet Commonly known as: KLOR-CON M Take 20 mEq by mouth daily.   torsemide 20 MG tablet Commonly known as: DEMADEX Take 1 tablet (20 mg total) by mouth 2 (two) times daily.   Vitamin E 180 MG Caps Take 180 mg by mouth daily.               Discharge Care Instructions  (From admission, onward)           Start     Ordered   02/21/23 0000  Discharge wound care:       Comments: Per Wound RN instructions   02/21/23 1113            Follow-up Information     [facility] Follow up.   Specialty: Home Health Services Why: Home health will call to schedule your next home visit. Contact information: [address] [place] [address] [phone]                Discharge Exam: Ceasar Mons Weights   02/19/23 1000  Weight: 60.1 kg        General:  AAO x 2,  cooperative, no distress;   HEENT:  Normocephalic, PERRL, otherwise with in Normal  limits   Neuro:  CNII-XII intact. , normal motor and sensation, reflexes intact   Lungs:   Clear to auscultation BL, Respirations unlabored,  No wheezes / crackles  Cardio:    S1/S2, RRR, No murmure, No Rubs or Gallops   Abdomen:  Soft, non-tender, bowel sounds active all four quadrants, no guarding or peritoneal signs.  Muscular  skeletal:  Limited exam - sever global generalized weaknesses - in bed, bed bound  2+ pulses,  symmetric, No pitting edema  Skin:  Dry, warm to touch, negative for any Rashes,  Wounds: Please see nursing documentation  Pressure Injury 04/01/20 Buttocks Right;Left Stage II -  Partial thickness loss of dermis presenting as a shallow open ulcer with a red, pink wound bed without slough. stage 2 and MASD (Active)  04/01/20 2045  Location: Buttocks  Location Orientation: Right;Left  Staging: Stage II -  Partial thickness loss of dermis presenting as a shallow open ulcer with a red, pink wound bed without slough.  Wound Description (Comments): stage 2 and MASD  Present on Admission: Yes       Condition at discharge: fair  The results of significant diagnostics from this hospitalization (including imaging, microbiology, ancillary and laboratory) are listed below for reference.   Imaging Studies: DG Chest Portable 1 View  Result Date: 02/18/2023 CLINICAL DATA:  Altered mental status, tachypnea EXAM: PORTABLE CHEST 1 VIEW COMPARISON:  02/03/2023, CT 06/03/2018 FINDINGS: Retrocardiac opacification persists, likely related to a small to moderate left pleural effusion with associated left basilar atelectasis or infiltrate. Small right pleural effusion is unchanged. Pulmonary insufflation is stable. 16 mm nodule is seen within the a right mid lung zone which demonstrates slow but progressive enlargement since remote prior CT examination is suspicious for a low-grade malignancy. No pneumothorax. Stable cardiomegaly. Pulmonary vascularity is normal. No acute bone  abnormality. IMPRESSION: 1. Small to moderate left pleural effusion with associated left basilar atelectasis or infiltrate. 2. Small right pleural effusion. 3. 16 mm nodule within the right mid lung zone which demonstrates slow but progressive enlargement since remote prior CT examination is suspicious for a low-grade malignancy. Dedicated nonemergent contrast enhanced CT examination is recommended for further characterization once the patient's acute clinical issues have resolved. Electronically Signed   By: Helyn Numbers M.D.   On: 02/18/2023 21:46   MR BRAIN WO CONTRAST  Result Date: 02/18/2023 CLINICAL DATA:  Altered mental status EXAM: MRI HEAD WITHOUT CONTRAST TECHNIQUE: Multiplanar, multiecho pulse sequences of the brain and surrounding structures were obtained without intravenous contrast. COMPARISON:  None Available. FINDINGS: Brain: No acute infarct, mass effect or extra-axial collection. Remote microhemorrhage in the right cerebellum. There is multifocal hyperintense T2-weighted signal within the white matter. Generalized volume loss. 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 Old left basal ganglia small vessel infarct. The midline structures are normal. Vascular: Major flow voids are preserved. Skull and upper cervical spine: Normal calvarium and skull base. Visualized upper cervical spine and soft tissues are normal. Sinuses/Orbits:No paranasal sinus fluid levels or advanced mucosal thickening. No mastoid or middle ear effusion. Normal orbits. IMPRESSION: 1. No acute intracranial abnormality. 2. 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 Electronically Signed   By: Deatra Robinson M.D.   On: 02/18/2023 20:18   CT Head Wo Contrast  Result Date: 02/18/2023 CLINICAL DATA:  Mental status change, unknown cause EXAM: CT HEAD WITHOUT CONTRAST TECHNIQUE: Contiguous axial images were obtained from the base of the skull through  the vertex without intravenous contrast. RADIATION DOSE REDUCTION: This exam was performed according to the departmental dose-optimization program which includes automated exposure control, adjustment of the mA and/or kV according to patient size and/or use of iterative reconstruction technique. COMPARISON:  CT Head 02/03/23 FINDINGS: Brain: Sequela of severe chronic microvascular ischemic change. No hydrocephalus. No extra-axial fluid collection. Redemonstrated chronic right parietal lobe infarct. There is a 9 x 8 mm calcified lesion along  the right cerebellar convexity, unchanged from prior exam, likely a meningioma. Vascular: No hyperdense vessel or unexpected calcification. Skull: Normal. Negative for fracture or focal lesion. Sinuses/Orbits: No middle ear or mastoid effusion. Paranasal sinuses are clear. Bilateral lens replacement. Orbits are otherwise unremarkable. Other: None. IMPRESSION: 1. No acute intracranial abnormality. 2. Sequela of severe chronic microvascular ischemic change with a few chronic infarcts. 3. Unchanged 9 mm calcified lesion along the right cerebellar convexity, likely a meningioma. Electronically Signed   By: Lorenza Cambridge M.D.   On: 02/18/2023 18:11   ECHOCARDIOGRAM COMPLETE  Result Date: 02/04/2023    ECHOCARDIOGRAM REPORT   Patient Name:   VINCIE LINN Woon Date of Exam: 02/04/2023 Medical Rec #:  [id number]     Height:       69.0 in Accession #:    [id number]    Weight:       143.3 lb Date of Birth:  May 11, 1941      BSA:          1.793 m Patient Age:    82 years      BP:           175/63 mmHg Patient Gender: F             HR:           70 bpm. [name] Procedure: 2D Echo, Cardiac Doppler and Color Doppler Indications:    Abnormal ECG R94.31  History:        Patient has prior history of Echocardiogram examinations, most                 recent 03/08/2021. CHF, Abnormal ECG, COPD, PAD and Stroke, Aortic                 Valve Disease, Signs/Symptoms:Altered Mental Status; Risk                  Factors:Hypertension, Dyslipidemia and Diabetes.  Sonographer:    Aron Baba Referring Phys: [id number] PRATIK D [facility]  Sonographer Comments: Image acquisition challenging due to respiratory motion. IMPRESSIONS  1. Left ventricular ejection fraction, by estimation, is 65 to 70%. The left ventricle has normal function. The left ventricle has no regional wall motion abnormalities. There is moderate asymmetric left ventricular hypertrophy of the septal segment. Left ventricular diastolic parameters are consistent with Grade I diastolic dysfunction (impaired relaxation). Elevated left atrial pressure.  2. Right ventricular systolic function is normal. The right ventricular size is normal. Tricuspid regurgitation signal is inadequate for assessing PA pressure.  3. Left atrial size was severely dilated.  4. Moderate pericardial effusion. The pericardial effusion is posterior to the left ventricle. There is no evidence of cardiac tamponade.  5. The mitral valve is abnormal. Trivial mitral valve regurgitation. No evidence of mitral stenosis. Moderate mitral annular calcification.  6. The aortic valve is tricuspid. There is moderate calcification of the aortic valve. There is moderate thickening of the aortic valve. Aortic valve regurgitation is not visualized. Mild to moderate aortic valve stenosis.  7. The inferior vena cava is normal in size with greater than 50% respiratory variability, suggesting right atrial pressure of 3 mmHg. FINDINGS  Left Ventricle: Left ventricular ejection fraction, by estimation, is 65 to 70%. The left ventricle has normal function. The left ventricle has no regional wall motion abnormalities. The left ventricular internal cavity size was normal in size. There is  moderate asymmetric left ventricular hypertrophy of the septal segment. Left ventricular diastolic parameters  are consistent with Grade I diastolic dysfunction (impaired relaxation). Elevated left atrial pressure. Right  Ventricle: The right ventricular size is normal. Right vetricular wall thickness was not well visualized. Right ventricular systolic function is normal. Tricuspid regurgitation signal is inadequate for assessing PA pressure. Left Atrium: Left atrial size was severely dilated. Right Atrium: Right atrial size was normal in size. Pericardium: A moderately sized pericardial effusion is present. The pericardial effusion is posterior to the left ventricle. There is no evidence of cardiac tamponade. Mitral Valve: The mitral valve is abnormal. There is mild thickening of the mitral valve leaflet(s). There is mild calcification of the mitral valve leaflet(s). Moderate mitral annular calcification. Trivial mitral valve regurgitation. No evidence of mitral valve stenosis. Tricuspid Valve: The tricuspid valve is normal in structure. Tricuspid valve regurgitation is not demonstrated. No evidence of tricuspid stenosis. Aortic Valve: The aortic valve is tricuspid. There is moderate calcification of the aortic valve. There is moderate thickening of the aortic valve. There is moderate aortic valve annular calcification. Aortic valve regurgitation is not visualized. Mild to moderate aortic stenosis is present. Aortic valve mean gradient measures 11.0 mmHg. Aortic valve peak gradient measures 22.0 mmHg. Aortic valve area, by VTI measures 1.21 cm. Pulmonic Valve: The pulmonic valve was not well visualized. Pulmonic valve regurgitation is mild. No evidence of pulmonic stenosis. Aorta: The aortic root is normal in size and structure. Venous: The inferior vena cava is normal in size with greater than 50% respiratory variability, suggesting right atrial pressure of 3 mmHg. IAS/Shunts: No atrial level shunt detected by color flow Doppler.  LEFT VENTRICLE PLAX 2D LVIDd:         3.40 cm   Diastology LVIDs:         2.10 cm   LV e' medial:    3.50 cm/s LV PW:         1.10 cm   LV E/e' medial:  29.4 LV IVS:        1.40 cm   LV e' lateral:    4.19 cm/s LVOT diam:     2.10 cm   LV E/e' lateral: 24.6 LV SV:         56 LV SV Index:   31 LVOT Area:     3.46 cm  RIGHT VENTRICLE RV S prime:     9.08 cm/s TAPSE (M-mode): 1.6 cm LEFT ATRIUM            Index        RIGHT ATRIUM           Index LA diam:      3.80 cm  2.12 cm/m   RA Area:     12.20 cm LA Vol (A2C): 54.8 ml  30.56 ml/m  RA Volume:   23.60 ml  13.16 ml/m LA Vol (A4C): 131.0 ml 73.06 ml/m  AORTIC VALVE                     PULMONIC VALVE AV Area (Vmax):    1.07 cm      PR End Diast Vel: 3.30 msec AV Area (Vmean):   1.05 cm AV Area (VTI):     1.21 cm AV Vmax:           234.33 cm/s AV Vmean:          151.667 cm/s AV VTI:            0.466 m AV Peak Grad:      22.0 mmHg AV  Mean Grad:      11.0 mmHg LVOT Vmax:         72.30 cm/s LVOT Vmean:        45.900 cm/s LVOT VTI:          0.163 m LVOT/AV VTI ratio: 0.35  AORTA Ao Root diam: 3.00 cm Ao Asc diam:  3.40 cm MITRAL VALVE MV Area (PHT): 2.62 cm     SHUNTS MV Decel Time: 290 msec     Systemic VTI:  0.16 m MV E velocity: 103.00 cm/s  Systemic Diam: 2.10 cm MV A velocity: 131.00 cm/s MV E/A ratio:  0.79 Dina Rich MD Electronically signed by Dina Rich MD Signature Date/Time: 02/04/2023/10:12:48 AM    Final    CT ABDOMEN PELVIS WO CONTRAST  Result Date: 02/03/2023 CLINICAL DATA:  Acute abdominal pain EXAM: CT ABDOMEN AND PELVIS WITHOUT CONTRAST TECHNIQUE: Multidetector CT imaging of the abdomen and pelvis was performed following the standard protocol without IV contrast. RADIATION DOSE REDUCTION: This exam was performed according to the departmental dose-optimization program which includes automated exposure control, adjustment of the mA and/or kV according to patient size and/or use of iterative reconstruction technique. COMPARISON:  03/06/2021 FINDINGS: Lower chest: Small bilateral pleural effusions with associated passive atelectasis. Suspected old granulomatous disease. Large pericardial effusion especially adjacent to the left  ventricular apex, but not substantially changed from 2022 hence considered chronic. Moderate cardiomegaly. Aortic and mitral valve calcifications. Aortic and left anterior descending coronary artery atherosclerotic vascular disease. We partially image a 1.6 by 1.3 cm right lower lobe nodule on image 1 series 9, uncertain whether this represents a nodular airspace opacity or neoplastic nodule. Airway thickening noted with mild airway plugging in the lower lobes. Patchy mosaic attenuation in the right middle lobe is nonspecific but could reflect edema Nodule along the upper medial right breast measures 1.2 cm in diameter on image 17 of series 7 and was previously less solid-appearing and 0.8 cm in diameter on 03/06/2021. This could be an inflammatory lesion although malignancy is not excluded. Hepatobiliary: Cholelithiasis noted including a 0.8 cm gallstone on image 37 series 7. Pancreas: Unremarkable Spleen: Unremarkable Adrenals/Urinary Tract: Stable thickened appearance of the adrenal glands. This is not changed from 03/03/2016 and is considered benign. No further workup of the adrenal glands is currently indicated based solely on imaging appearance. Two Bosniak category 1 cysts of the left kidney have fluid density. No further imaging workup of these lesions is indicated. 2 mm right kidney lower pole nonobstructive renal calculus. 3 mm left kidney lower pole nonobstructive renal calculus. No hydronephrosis or hydroureter. Right posterior bladder diverticulum noted. Stomach/Bowel: Small type 1 hiatal hernia. Proximal gastric wall thickening is probably related to nondistention. Mild prominence of stool in the rectum, cannot exclude mild/early fecal impaction. Distal descending and sigmoid colon diverticulosis without findings of active diverticulitis. The appendix does not appear inflamed. No dilated small bowel. Vascular/Lymphatic: Atherosclerosis is present, including aortoiliac atherosclerotic disease. 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 No pathologic adenopathy is identified. Reproductive: Calcified uterine fibroids measuring up to 1.9 cm in diameter. Stable adnexal calcifications. No further workup of these lesions is indicated. Other: No supplemental non-categorized findings. Musculoskeletal: Bilateral groin hernias containing adipose tissue with some stranding along the left groin hernia and some postoperative findings along the left common iliac vasculature. Left proximal femoral IM nail system noted. Bony demineralization.  General regional muscular atrophy.  High density material within the superficial umbilicus, possibly calcification or foreign body. Lumbar spondylosis and degenerative disc disease, potential left foraminal impingement at L5-S1 due to spurring. Mild dextroconvex lumbar scoliosis. Chronic superior endplate compression fracture at L3 with 30% loss of vertebral body height (this fracture was shown to be acute on the prior MRI from 09/13/2020). IMPRESSION: 1. Small bilateral pleural effusions with associated passive atelectasis. 2. 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 3. Moderate cardiomegaly. 4. We partially image a 1.6 by 1.3 cm right lower lobe nodule, uncertain whether this represents a nodular airspace opacity or neoplastic nodule. Consider one of the following in 3 months for both low-risk and high-risk individuals: (a) repeat chest CT, (b) follow-up PET-CT, or (c) tissue sampling. This recommendation follows the consensus statement: Guidelines for Management of Incidental Pulmonary Nodules Detected on CT Images: From the Fleischner Society 2017; Radiology 2017; 284:228-243. 5. Nodule along the upper medial right breast measures 1.2 cm in diameter and was previously less solid-appearing and 0.8 cm  in diameter on 03/06/2021. This could be an inflammatory lesion although malignancy is not excluded. If the patient is not up-to-date on mammography then diagnostic mammography would be recommended. 6. Cholelithiasis. 7. Nonobstructive bilateral nephrolithiasis. 8. Small type 1 hiatal hernia. 9. Mild prominence of stool in the rectum, cannot exclude mild/early fecal impaction. 10. Distal descending and sigmoid colon diverticulosis without findings of active diverticulitis. 11. Chronic superior endplate compression fracture at L3 with 30% loss of vertebral body height. 12. Aortic, coronary, and systemic atherosclerosis. Aortic Atherosclerosis (ICD10-I70.0). Electronically Signed   By: Gaylyn Rong M.D.   On: 02/03/2023 15:38   CT Head Wo Contrast  Result Date: 02/03/2023 CLINICAL DATA:  Delirium. EXAM: CT HEAD WITHOUT CONTRAST TECHNIQUE: Contiguous axial images were obtained from the base of the skull through the vertex without intravenous contrast. RADIATION DOSE REDUCTION: This exam was performed according to the departmental dose-optimization program which includes automated exposure control, adjustment of the mA and/or kV according to patient size and/or use of iterative reconstruction technique. COMPARISON:  Head CT 03/06/2021 FINDINGS: Brain: No evidence of acute infarction, hemorrhage, hydrocephalus, extra-axial collection or mass effect. Generalized brain atrophy. 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 Small bilateral cerebellar infarcts more extensive on the left by coronal reformats. 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 Skull: Normal. Negative for fracture or focal lesion. Sinuses/Orbits: No acute finding. IMPRESSION: 1. No acute finding. 2. 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 3. 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  Electronically Signed   By: Tiburcio Pea M.D.   On: 02/03/2023 15:27   DG Chest 1 View  Result Date: 02/03/2023 CLINICAL DATA:  Incisional bleeding. EXAM: CHEST  1 VIEW COMPARISON:  X-ray 03/06/2021. FINDINGS: Enlarged cardiopericardial silhouette with small bilateral pleural effusions. Basilar atelectasis as well. No pneumothorax or edema. Film is rotated to the left. Overlapping cardiac leads. Osteopenia. IMPRESSION: Enlarged heart.  Small pleural effusions. Electronically Signed   By: Karen Kays M.D.   On: 02/03/2023 12:44   PERIPHERAL VASCULAR CATHETERIZATION  Result Date: 01/28/2023 See surgical note for result.   Microbiology: Results for orders placed or performed during the hospital encounter of 02/18/23  Culture, blood (routine x 2) Call MD if unable to obtain prior to antibiotics being given     Status: None (  Preliminary result)   Collection Time: 02/19/23  4:44 AM   Specimen: BLOOD  Result Value Ref Range Status   Specimen Description BLOOD  Final   Special Requests NONE  Final   Culture   Final    NO GROWTH 2 DAYS Performed at [facility], [address], [place], [place] [address]    Report Status PENDING  Incomplete  Culture, blood (routine x 2) Call MD if unable to obtain prior to antibiotics being given     Status: None (Preliminary result)   Collection Time: 02/19/23  4:59 AM   Specimen: BLOOD  Result Value Ref Range Status   Specimen Description BLOOD  Final   Special Requests NONE  Final   Culture   Final    NO GROWTH 2 DAYS Performed at [facility], [address] [place], [place] [address]    Report Status PENDING  Incomplete    Labs: CBC: Recent Labs  Lab 02/18/23 1558 02/19/23 0444 02/20/23 0427 02/21/23 0501  WBC 10.1 12.7* 10.7* 10.6*  NEUTROABS 8.2*  --   --   --   HGB 9.3* 9.2* 8.9* 8.6*  HCT 31.1* 31.0* 29.9* 28.7*  MCV 80.2 80.3 79.5* 79.9*  PLT 360 256 274 205   Basic Metabolic Panel: Recent Labs  Lab 02/18/23 1558  02/19/23 0444 02/20/23 0427 02/21/23 0501  NA 143 143 139 134*  K 2.9* 2.7* 3.0* 3.7  CL 106 107 104 104  CO2 29 28 28 22   GLUCOSE 266* 204* 124* 130*  BUN 19 16 14 12   CREATININE 0.60 0.48 0.45 0.44  CALCIUM 9.2 9.1 8.8* 8.7*  MG 2.3 2.1  --   --   PHOS  --  1.8*  --   --    Liver Function Tests: Recent Labs  Lab 02/18/23 1558 02/19/23 0444  AST 25 23  ALT 30 26  ALKPHOS 45 44  BILITOT 0.8 1.1  PROT 5.8* 5.6*  ALBUMIN 2.9* 2.8*   CBG: Recent Labs  Lab 02/20/23 1920 02/20/23 2352 02/21/23 0404 02/21/23 0712 02/21/23 1107  GLUCAP 104* 134* 145* 101* 141*    Discharge time spent: greater than 55  minutes.  Signed: Kendell Bane, MD [facility] 02/21/2023

## 2023-02-21 NOTE — Progress Notes (Signed)
Reviewed AVS with pts Husband and son who verbalized understanding of all DC teaching and instructions. Pt will be leaving via EMS. IV removed without complications.

## 2023-02-21 NOTE — TOC Transition Note (Addendum)
Transition of Care [facility]) - CM/SW Discharge Note   Patient Details  Name: [name] MRN: [id number] Date of Birth: 11/11/1940  [facility] CM/SW Contact:  Leitha Bleak, RN Phone Number: 02/21/2023, 11:01 AM   Clinical Narrative:   Patient discharging home. CM call her CAP aide supervisor [phone] to give an update. They are requesting Palliative referral.  MD agrees and will order. They will call [facility]. He will place new orders for [facility] for home health. CAP aid services will continue.   Faxed DC summary to CAP Aide supervisor.   Final next level of care: Home w Home Health Services Barriers to Discharge: Barriers Resolved   Patient Goals and CMS Choice CMS Medicare.gov Compare Post Acute Care list provided to:: Patient Represenative (must comment) Choice offered to / list presented to : Spouse  Discharge Placement       Patient and family notified of of transfer: 02/21/23  Discharge Plan and Services Additional resources added to the After Visit Summary for      Post Acute Care Choice: Home Health              Social Determinants of Health (SDOH) Interventions SDOH Screenings   Food Insecurity: No Food Insecurity (02/03/2023)  Housing: Low Risk  (02/03/2023)  Transportation Needs: No Transportation Needs (02/03/2023)  Utilities: Not At Risk (02/03/2023)  Tobacco Use: Low Risk  (02/18/2023)    Readmission Risk Interventions    02/19/2023    4:37 PM 02/04/2023   12:07 PM  Readmission Risk Prevention Plan  Transportation Screening Complete Complete  PCP or Specialist Appt within 3-5 Days Complete   Home Care Screening  Complete  Medication Review (RN CM)  Complete  HRI or Home Care Consult Complete   Social Work Consult for Recovery Care Planning/Counseling Complete   Palliative Care Screening Not Applicable   Medication Review Oceanographer) Complete

## 2023-02-22 LAB — CULTURE, BLOOD (ROUTINE X 2): Culture: NO GROWTH

## 2023-02-24 LAB — CULTURE, BLOOD (ROUTINE X 2): Culture: NO GROWTH

## 2023-02-24 LAB — LEGIONELLA PNEUMOPHILA SEROGP 1 UR AG: L. pneumophila Serogp 1 Ur Ag: NEGATIVE

## 2023-02-25 ENCOUNTER — Encounter (INDEPENDENT_AMBULATORY_CARE_PROVIDER_SITE_OTHER): Payer: Self-pay | Admitting: Vascular Surgery

## 2023-02-26 ENCOUNTER — Ambulatory Visit: Payer: Medicare HMO | Admitting: Internal Medicine

## 2023-02-26 LAB — VAS US ABI WITH/WO TBI
Left ABI: 1.13
Right ABI: 1.52

## 2023-04-06 DEATH — deceased

## 2023-05-21 ENCOUNTER — Ambulatory Visit (INDEPENDENT_AMBULATORY_CARE_PROVIDER_SITE_OTHER): Payer: Medicare HMO | Admitting: Nurse Practitioner

## 2023-05-21 ENCOUNTER — Encounter (INDEPENDENT_AMBULATORY_CARE_PROVIDER_SITE_OTHER): Payer: Medicare HMO
